# Patient Record
Sex: Male | Born: 1960 | Race: Black or African American | Hispanic: No | Marital: Single | Smoking: Former smoker
Health system: Southern US, Community
[De-identification: ages and names within clinical notes are randomized; demographics above are authoritative.]

## PROBLEM LIST (undated history)

## (undated) VITALS — BP 143/97 | HR 80 | Temp 97.5°F | Resp 17

## (undated) DIAGNOSIS — F319 Bipolar disorder, unspecified: Secondary | ICD-10-CM

## (undated) DIAGNOSIS — I509 Heart failure, unspecified: Secondary | ICD-10-CM

## (undated) DIAGNOSIS — F32A Depression, unspecified: Secondary | ICD-10-CM

## (undated) DIAGNOSIS — M1990 Unspecified osteoarthritis, unspecified site: Secondary | ICD-10-CM

## (undated) DIAGNOSIS — F329 Major depressive disorder, single episode, unspecified: Secondary | ICD-10-CM

## (undated) DIAGNOSIS — R011 Cardiac murmur, unspecified: Secondary | ICD-10-CM

## (undated) DIAGNOSIS — F40240 Claustrophobia: Secondary | ICD-10-CM

## (undated) DIAGNOSIS — I5033 Acute on chronic diastolic (congestive) heart failure: Secondary | ICD-10-CM

## (undated) DIAGNOSIS — R748 Abnormal levels of other serum enzymes: Secondary | ICD-10-CM

## (undated) DIAGNOSIS — I10 Essential (primary) hypertension: Secondary | ICD-10-CM

## (undated) HISTORY — DX: Abnormal levels of other serum enzymes: R74.8

## (undated) HISTORY — DX: Acute on chronic diastolic (congestive) heart failure: I50.33

## (undated) HISTORY — PX: KNEE SURGERY: SHX244

## (undated) NOTE — *Deleted (*Deleted)
Pharmacy Resident Rounding Note - for learning purposes only, not an active part of the chart  S/o  Admit Complaint:  Anticoagulation Infectious Disease Cardiovascular Endocrinology Gastrointestinal / Nutrition Neurology Nephrology Pulmonary Hematology / Oncology PTA Medication Issues Best Practices

## (undated) NOTE — *Deleted (*Deleted)
Pharmacy Resident Rounding Note - for learning purposes only, not an active part of the chart  S/o  Admit Complaint: HTN urgency, rhabdo  H/o uncontrolled DM x30y  Anticoagulation - enox 0.5mg /kg ppx Infectious Disease Cardiovascular SBP variable, 140-170, HR 60-70s dilt 180 q24, atenolol 25mg  qd, hydral prn Endocrinology CBGs <150 lantus 25 BID + SSI Gastrointestinal / Nutrition protonix  Neurology Gaba 800 bid, dose ok  Nephrology SCr 1.07>>1.42>>1.27>>1.17 UOP not charted accurately, ~0.56mL/kg 11/10 K 3.5>>3.3  Lasix 80mg  q12 >> q8h, albumin x1 KCl 20>>40 qd  Hgb 11/10  No role for ESA, iron labs outpatient  Pulmonary 100% RA  Hematology / Oncology PTA Medication Issues janumet, KCl 20 qd, ibuprofen,  Best Practices miralax    AoCKD stage II (nephrotic syndrome) Follow lytes w/ diuresis  Monitor cbc, f/u with iron labs if done inpatient F/u with benazepril restart

---

## 1999-01-22 ENCOUNTER — Emergency Department (HOSPITAL_COMMUNITY): Admission: EM | Admit: 1999-01-22 | Discharge: 1999-01-22 | Payer: Self-pay | Admitting: *Deleted

## 1999-08-21 ENCOUNTER — Encounter: Payer: Self-pay | Admitting: Emergency Medicine

## 1999-08-21 ENCOUNTER — Emergency Department (HOSPITAL_COMMUNITY): Admission: EM | Admit: 1999-08-21 | Discharge: 1999-08-21 | Payer: Self-pay | Admitting: Emergency Medicine

## 1999-09-03 ENCOUNTER — Encounter: Payer: Self-pay | Admitting: Emergency Medicine

## 1999-09-03 ENCOUNTER — Inpatient Hospital Stay (HOSPITAL_COMMUNITY): Admission: EM | Admit: 1999-09-03 | Discharge: 1999-09-06 | Payer: Self-pay | Admitting: Emergency Medicine

## 1999-09-05 ENCOUNTER — Encounter: Payer: Self-pay | Admitting: Internal Medicine

## 2000-05-12 ENCOUNTER — Emergency Department (HOSPITAL_COMMUNITY): Admission: EM | Admit: 2000-05-12 | Discharge: 2000-05-12 | Payer: Self-pay | Admitting: Emergency Medicine

## 2001-09-09 ENCOUNTER — Encounter: Payer: Self-pay | Admitting: Emergency Medicine

## 2001-09-10 ENCOUNTER — Inpatient Hospital Stay (HOSPITAL_COMMUNITY): Admission: EM | Admit: 2001-09-10 | Discharge: 2001-09-10 | Payer: Self-pay | Admitting: Emergency Medicine

## 2001-09-11 ENCOUNTER — Emergency Department (HOSPITAL_COMMUNITY): Admission: EM | Admit: 2001-09-11 | Discharge: 2001-09-11 | Payer: Self-pay | Admitting: Emergency Medicine

## 2001-09-15 ENCOUNTER — Encounter: Admission: RE | Admit: 2001-09-15 | Discharge: 2001-09-15 | Payer: Self-pay | Admitting: Sports Medicine

## 2001-10-06 ENCOUNTER — Encounter: Payer: Self-pay | Admitting: *Deleted

## 2001-10-06 ENCOUNTER — Emergency Department (HOSPITAL_COMMUNITY): Admission: EM | Admit: 2001-10-06 | Discharge: 2001-10-06 | Payer: Self-pay | Admitting: Emergency Medicine

## 2002-01-29 ENCOUNTER — Emergency Department (HOSPITAL_COMMUNITY): Admission: EM | Admit: 2002-01-29 | Discharge: 2002-01-29 | Payer: Self-pay | Admitting: Emergency Medicine

## 2002-01-29 ENCOUNTER — Encounter: Payer: Self-pay | Admitting: Emergency Medicine

## 2002-02-09 ENCOUNTER — Emergency Department (HOSPITAL_COMMUNITY): Admission: EM | Admit: 2002-02-09 | Discharge: 2002-02-09 | Payer: Self-pay | Admitting: Emergency Medicine

## 2002-02-09 ENCOUNTER — Encounter: Payer: Self-pay | Admitting: Emergency Medicine

## 2002-02-26 ENCOUNTER — Inpatient Hospital Stay (HOSPITAL_COMMUNITY): Admission: EM | Admit: 2002-02-26 | Discharge: 2002-02-28 | Payer: Self-pay | Admitting: Emergency Medicine

## 2003-07-16 ENCOUNTER — Emergency Department (HOSPITAL_COMMUNITY): Admission: EM | Admit: 2003-07-16 | Discharge: 2003-07-16 | Payer: Self-pay | Admitting: *Deleted

## 2003-07-17 ENCOUNTER — Emergency Department (HOSPITAL_COMMUNITY): Admission: EM | Admit: 2003-07-17 | Discharge: 2003-07-17 | Payer: Self-pay | Admitting: Emergency Medicine

## 2004-09-20 ENCOUNTER — Emergency Department (HOSPITAL_COMMUNITY): Admission: EM | Admit: 2004-09-20 | Discharge: 2004-09-20 | Payer: Self-pay | Admitting: Emergency Medicine

## 2007-03-15 ENCOUNTER — Emergency Department (HOSPITAL_COMMUNITY): Admission: EM | Admit: 2007-03-15 | Discharge: 2007-03-16 | Payer: Self-pay | Admitting: Emergency Medicine

## 2008-11-22 IMAGING — CR DG CHEST 1V PORT
1 series · 1 of 1 positions shown · non-contrast
Comparison: none

CLINICAL DATA: Chest pain.
 PORTABLE CHEST - 1 VIEW:

[AP]
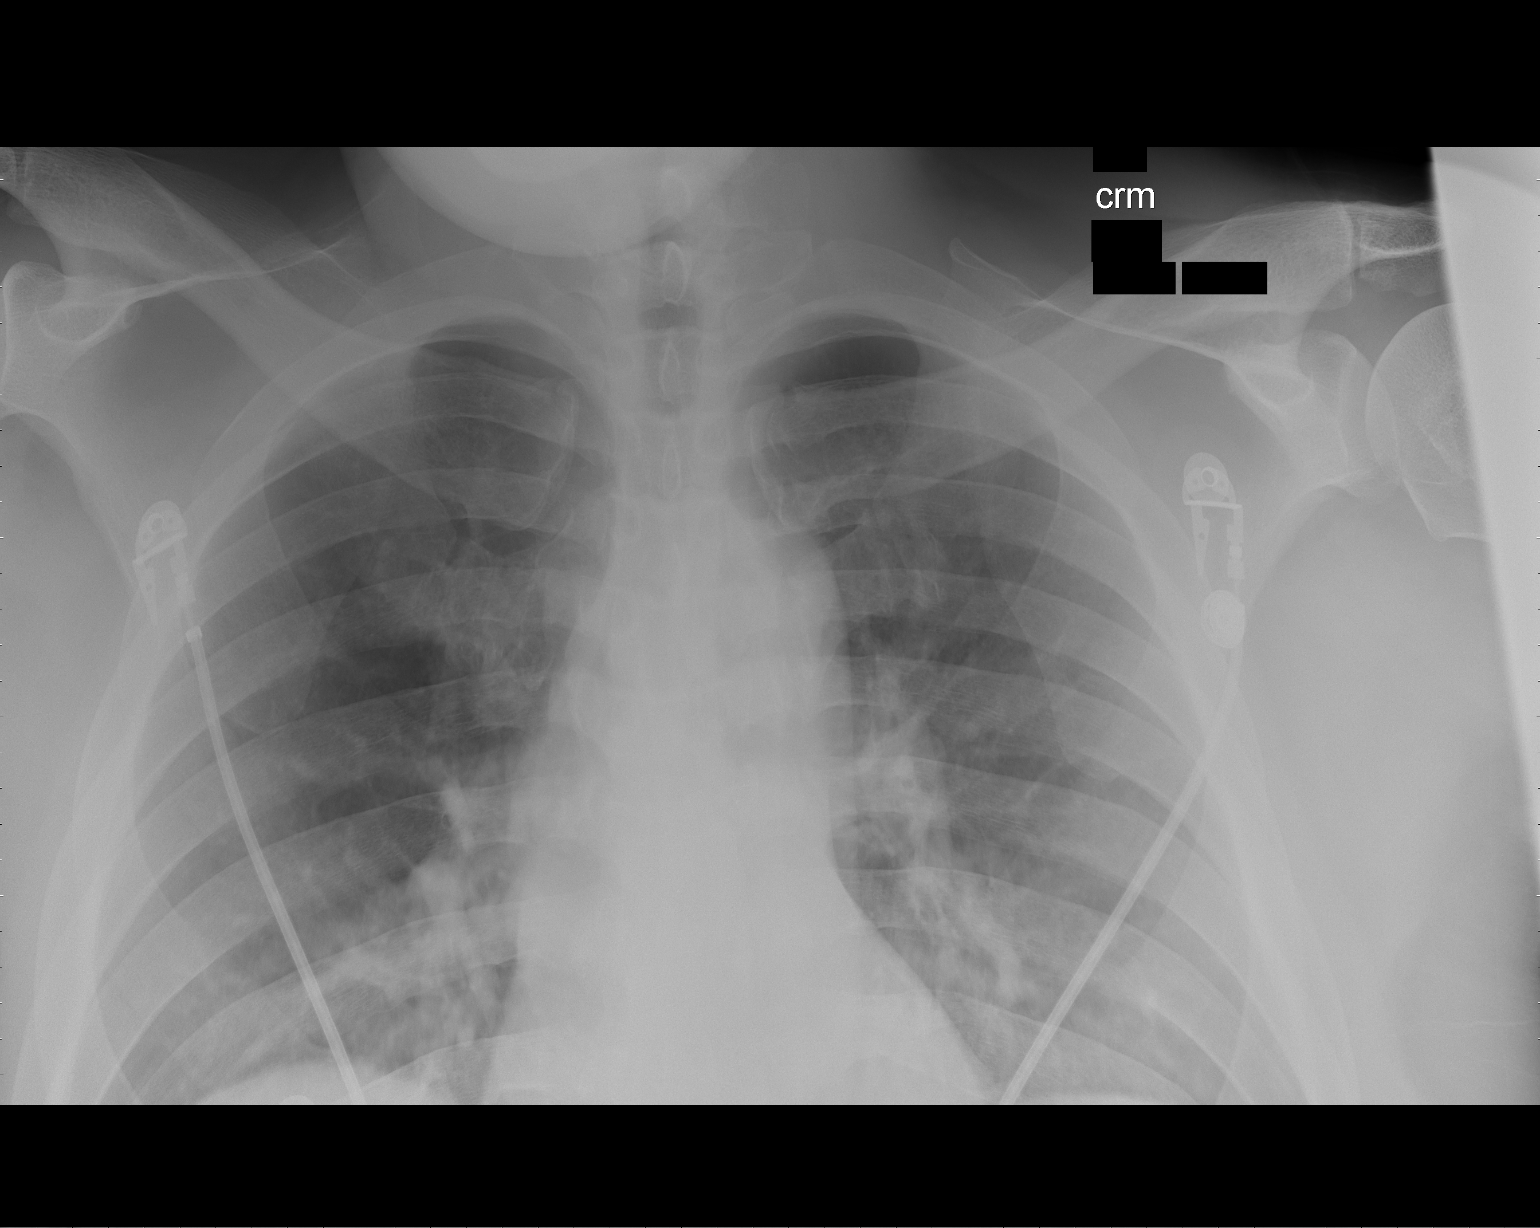

[1 of 1 positions shown; findings below may reference images not displayed]

FINDINGS: The study is limited by poor inspiration.  There is no acute infiltrate or pleural effusion.  No pulmonary edema noted.
IMPRESSION: No acute infiltrate or pleural effusion. No pulmonary edema.

## 2008-11-23 IMAGING — CT CT ANGIO CHEST
2 of 5 series · 19 of 36 positions shown · IV contrast (APPLIED)
Comparison: None.

CLINICAL DATA: 46-year-old with chest pain and shortness of breath. 
CT ANGIOGRAPHY OF CHEST:
TECHNIQUE: Multidetector CT imaging of the chest was performed during bolus injection of intravenous contrast.  Multiplanar CT angiographic image reconstructions were generated to evaluate the vascular anatomy.
Contrast:  100 cc Omnipaque 300.

[Series 5: pulm embolism 1.0 thins · axial · 0.73mm/px · z∈[-60,+152]mm · 16 of 238 slices shown]
[im 13/238  lung]
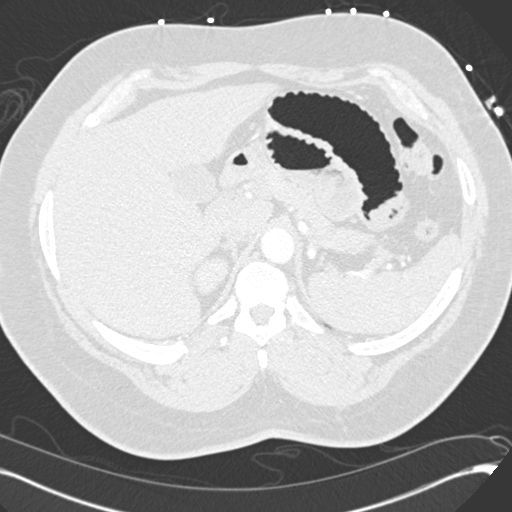
[im 25/238  mediastinal]
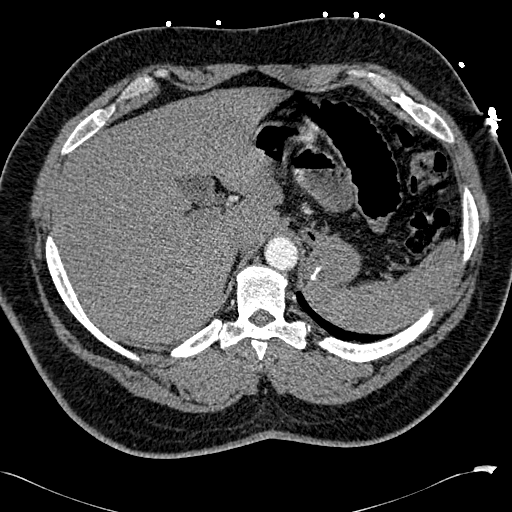
[im 38/238  lung]
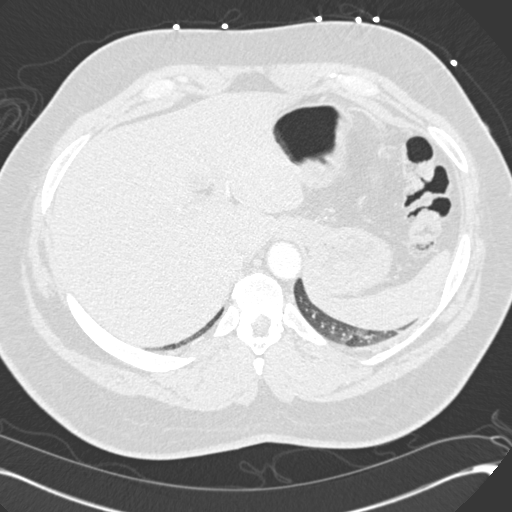
[im 50/238  mediastinal]
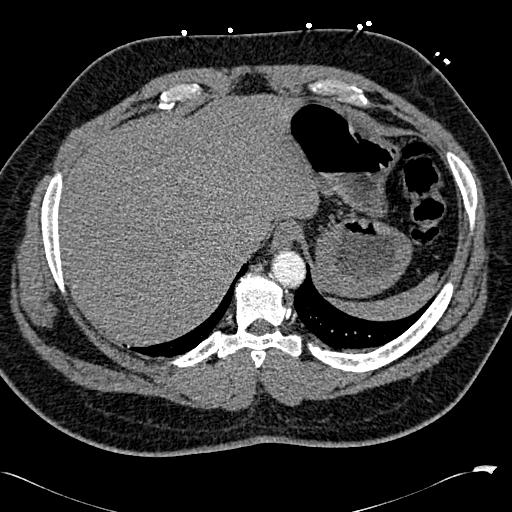
[im 75/238  lung]
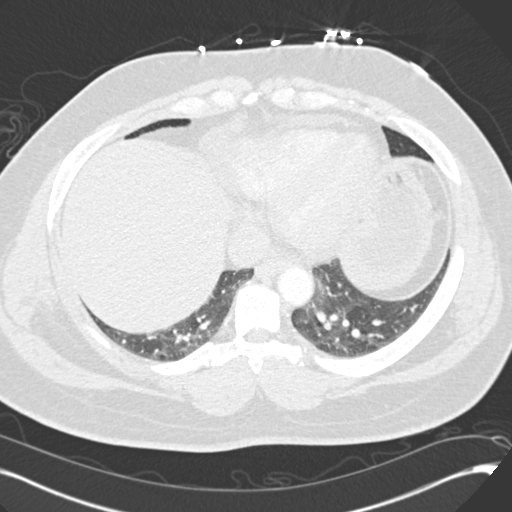
[im 88/238  mediastinal]
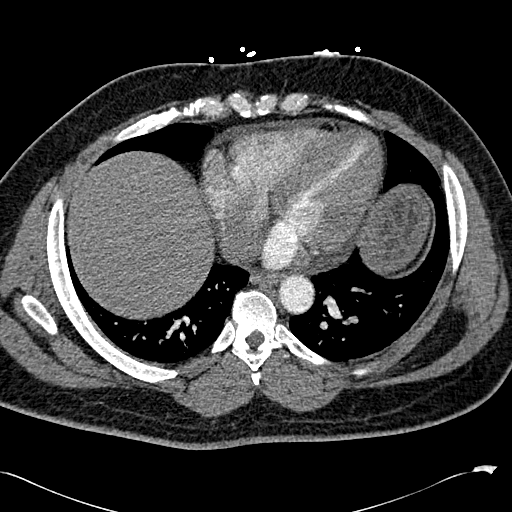
[im 100/238  lung]
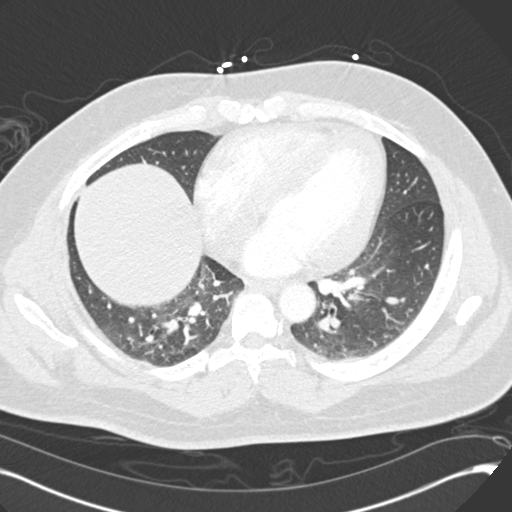
[im 113/238  mediastinal]
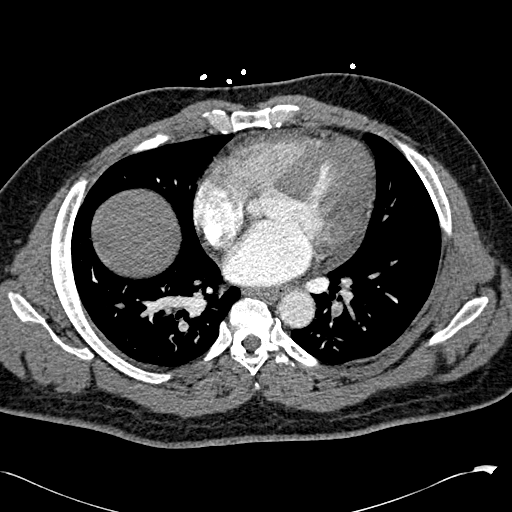
[im 125/238  lung]
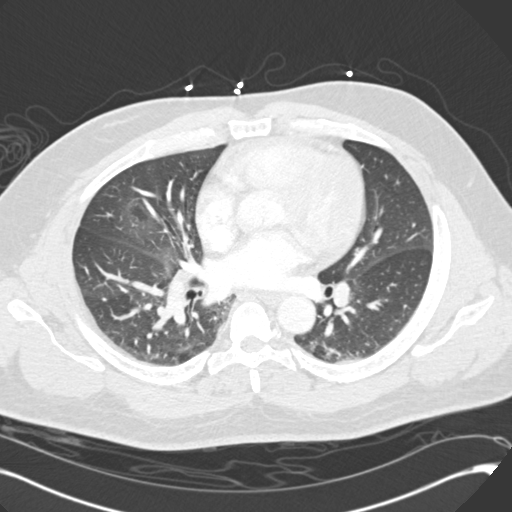
[im 138/238  mediastinal]
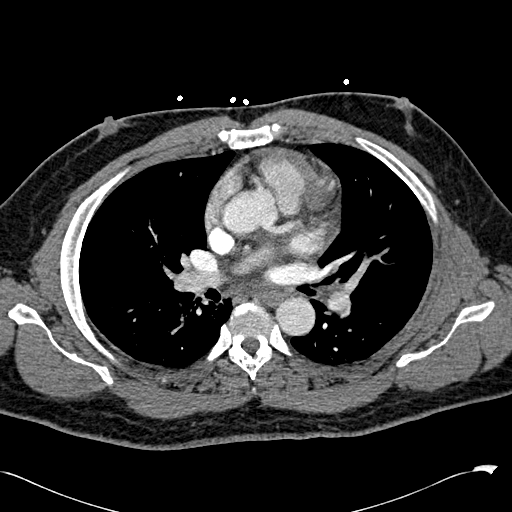
[im 150/238  lung]
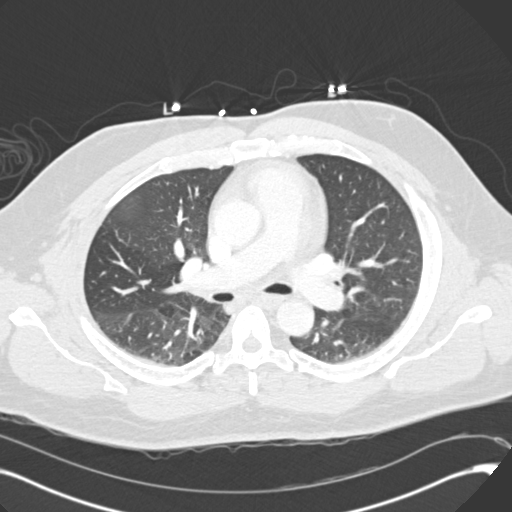
[im 163/238  mediastinal]
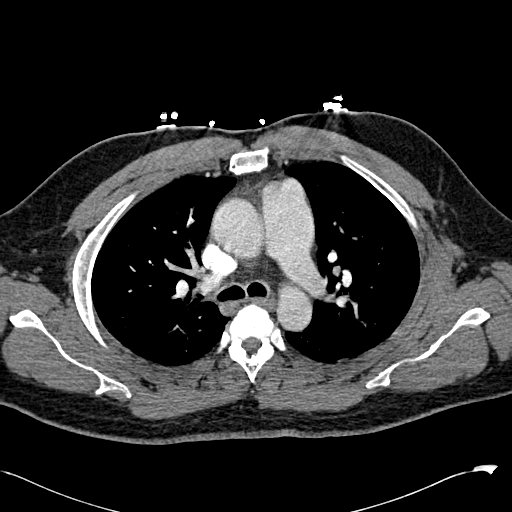
[im 188/238  lung]
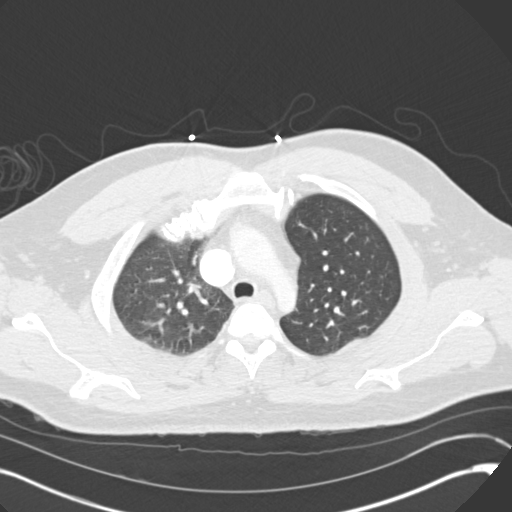
[im 200/238  mediastinal]
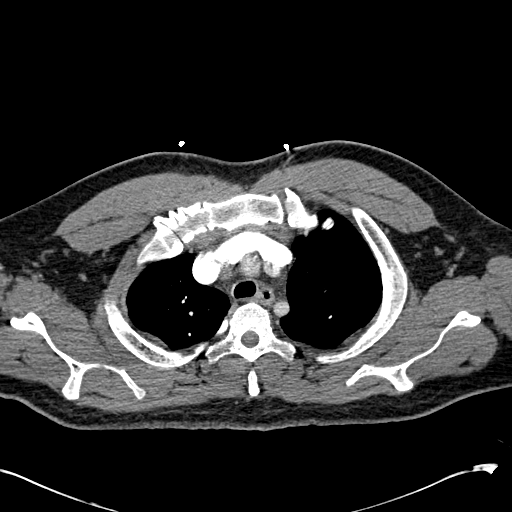
[im 213/238  lung]
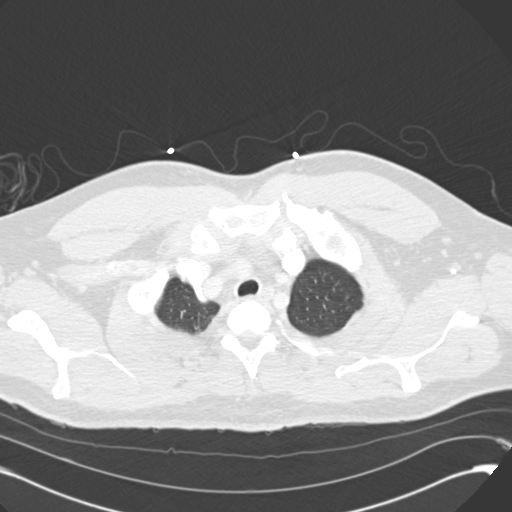
[im 225/238  mediastinal]
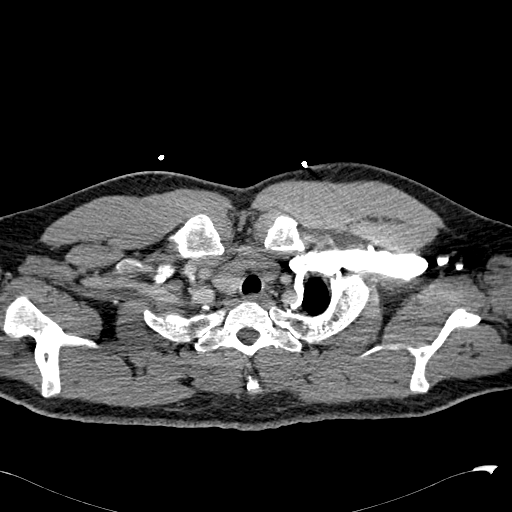

[Series 9: pulm embolism 2.0 cor · coronal · 0.63mm/px · 3 of 114 slices shown]
[im 23/114  mediastinal]
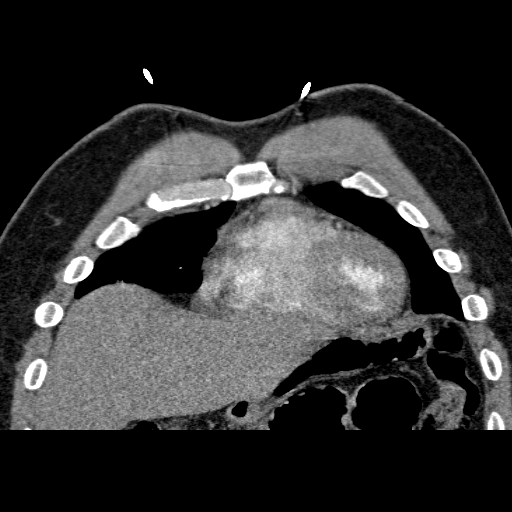
[im 46/114  mediastinal]
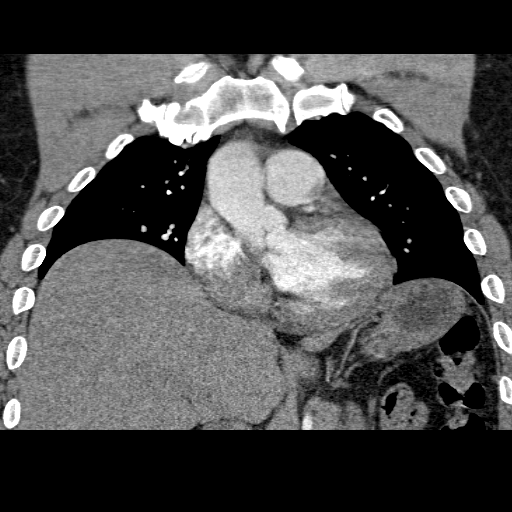
[im 68/114  mediastinal]
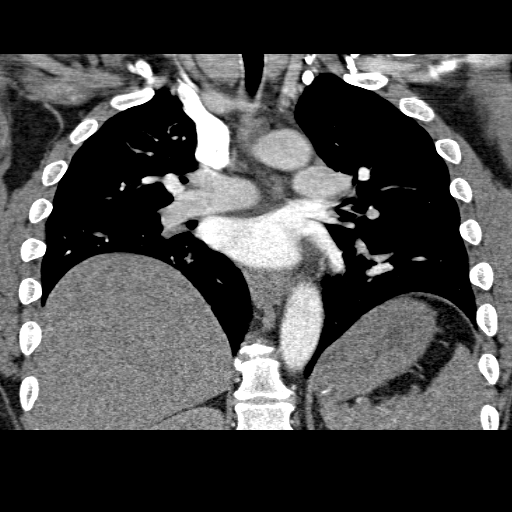

[19 of 36 positions shown; findings below may reference images not displayed]

FINDINGS: The chest wall, soft tissues and bony structures are unremarkable.  Small axillary lymph nodes but no adenopathy.  The thyroid gland is grossly normal.  The aorta is normal in caliber.  There is a small amount of pericardial fluid.  No mediastinal or hilar adenopathy.  The aorta is normal in caliber.  No dissection.  The pulmonary arterial tree is suboptimally opacified, but I do not see any definite filling defects to suggest pulmonary emboli.
The esophagus is grossly normal.  
Examination of the lung parenchyma demonstrates dependent atelectasis.  No pleural effusions or pulmonary edema.  Small right middle lobe pulmonary nodule is noted with sharp margins and most likely a pulmonary lymph node. 
The upper abdomen is grossly normal.
IMPRESSION: 1.  Suboptimal opacification of pulmonary arteries, but no definite pulmonary emboli are seen.
2.  Small pericardial effusion.  
3.   Dependent atelectasis and mild vascular congestion without overt pulmonary edema. 
4.  Right middle lobe pulmonary nodule is likely a benign intrapulmonary lymph node.  
5.  Followup noncontrast chest CT in 12 months is suggested.  
6.  No mediastinal or hilar adenopathy and unremarkable upper abdomen.

## 2009-10-15 ENCOUNTER — Emergency Department (HOSPITAL_COMMUNITY): Admission: EM | Admit: 2009-10-15 | Discharge: 2009-10-15 | Payer: Self-pay | Admitting: Emergency Medicine

## 2009-12-24 ENCOUNTER — Inpatient Hospital Stay (HOSPITAL_COMMUNITY): Admission: EM | Admit: 2009-12-24 | Discharge: 2009-12-26 | Payer: Self-pay | Admitting: Emergency Medicine

## 2010-01-29 ENCOUNTER — Inpatient Hospital Stay (HOSPITAL_COMMUNITY)
Admission: EM | Admit: 2010-01-29 | Discharge: 2010-02-01 | Payer: Self-pay | Source: Home / Self Care | Attending: Internal Medicine | Admitting: Internal Medicine

## 2010-01-30 ENCOUNTER — Encounter (INDEPENDENT_AMBULATORY_CARE_PROVIDER_SITE_OTHER): Payer: Self-pay | Admitting: Internal Medicine

## 2010-02-01 ENCOUNTER — Encounter (INDEPENDENT_AMBULATORY_CARE_PROVIDER_SITE_OTHER): Payer: Self-pay | Admitting: Internal Medicine

## 2010-05-07 LAB — BASIC METABOLIC PANEL
BUN: 9 mg/dL (ref 6–23)
CO2: 23 mEq/L (ref 19–32)
Calcium: 9.4 mg/dL (ref 8.4–10.5)
Chloride: 104 mEq/L (ref 96–112)
Creatinine, Ser: 0.75 mg/dL (ref 0.4–1.5)
GFR calc Af Amer: >60 mL/min (ref >60)
GFR calc non Af Amer: >60 mL/min (ref >60)
Glucose, Bld: 277 mg/dL — ABNORMAL HIGH (ref 70–99)
Potassium: 3.7 mEq/L (ref 3.5–5.1)
Sodium: 135 mEq/L (ref 135–145)

## 2010-05-07 LAB — CBC
HCT: 37 % — ABNORMAL LOW (ref 39.0–52.0)
HCT: 38.4 % — ABNORMAL LOW (ref 39.0–52.0)
Hemoglobin: 12 g/dL — ABNORMAL LOW (ref 13.0–17.0)
Hemoglobin: 13.1 g/dL (ref 13.0–17.0)
MCH: 25.4 pg — ABNORMAL LOW (ref 26.0–34.0)
MCH: 26.5 pg (ref 26.0–34.0)
MCHC: 32.4 g/dL (ref 30.0–36.0)
MCHC: 34.1 g/dL (ref 30.0–36.0)
MCV: 77.6 fL — ABNORMAL LOW (ref 78.0–100.0)
MCV: 78.4 fL (ref 78.0–100.0)
Platelets: 162 10*3/uL (ref 150–400)
Platelets: 164 10*3/uL (ref 150–400)
RBC: 4.72 MIL/uL (ref 4.22–5.81)
RBC: 4.95 MIL/uL (ref 4.22–5.81)
RDW: 13.5 % (ref 11.5–15.5)
RDW: 13.6 % (ref 11.5–15.5)
WBC: 3.8 10*3/uL — ABNORMAL LOW (ref 4.0–10.5)
WBC: 4.5 10*3/uL (ref 4.0–10.5)

## 2010-05-07 LAB — CARDIAC PANEL(CRET KIN+CKTOT+MB+TROPI)
CK, MB: 2.2 ng/mL (ref 0.3–4.0)
CK, MB: 2.4 ng/mL (ref 0.3–4.0)
Relative Index: 2.1 (ref 0.0–2.5)
Relative Index: 2.3 (ref 0.0–2.5)
Total CK: 104 U/L (ref 7–232)
Total CK: 105 U/L (ref 7–232)
Troponin I: 0.01 ng/mL (ref 0.00–0.06)
Troponin I: 0.02 ng/mL (ref 0.00–0.06)

## 2010-05-07 LAB — COMPREHENSIVE METABOLIC PANEL
ALT: 14 U/L (ref 0–53)
AST: 18 U/L (ref 0–37)
Albumin: 3.2 g/dL — ABNORMAL LOW (ref 3.5–5.2)
Alkaline Phosphatase: 67 U/L (ref 39–117)
BUN: 9 mg/dL (ref 6–23)
CO2: 26 mEq/L (ref 19–32)
Calcium: 8.7 mg/dL (ref 8.4–10.5)
Chloride: 101 mEq/L (ref 96–112)
Creatinine, Ser: 0.81 mg/dL (ref 0.4–1.5)
GFR calc Af Amer: >60 mL/min (ref >60)
GFR calc non Af Amer: >60 mL/min (ref >60)
Glucose, Bld: 334 mg/dL — ABNORMAL HIGH (ref 70–99)
Potassium: 3.6 mEq/L (ref 3.5–5.1)
Sodium: 133 mEq/L — ABNORMAL LOW (ref 135–145)
Total Bilirubin: 0.6 mg/dL (ref 0.3–1.2)
Total Protein: 6.6 g/dL (ref 6.0–8.3)

## 2010-05-07 LAB — RAPID URINE DRUG SCREEN, HOSP PERFORMED
Amphetamines: NOT DETECTED
Barbiturates: NOT DETECTED
Benzodiazepines: NOT DETECTED
Cocaine: POSITIVE — AB
Opiates: POSITIVE — AB
Tetrahydrocannabinol: NOT DETECTED

## 2010-05-07 LAB — DIFFERENTIAL
Basophils Absolute: 0 10*3/uL (ref 0.0–0.1)
Basophils Relative: 0 % (ref 0–1)
Eosinophils Absolute: 0 10*3/uL (ref 0.0–0.7)
Eosinophils Relative: 1 % (ref 0–5)
Lymphocytes Relative: 50 % — ABNORMAL HIGH (ref 12–46)
Lymphs Abs: 2.2 10*3/uL (ref 0.7–4.0)
Monocytes Absolute: 0.5 10*3/uL (ref 0.1–1.0)
Monocytes Relative: 11 % (ref 3–12)
Neutro Abs: 1.7 10*3/uL (ref 1.7–7.7)
Neutrophils Relative %: 38 % — ABNORMAL LOW (ref 43–77)

## 2010-05-07 LAB — URINALYSIS, ROUTINE W REFLEX MICROSCOPIC
Bilirubin Urine: NEGATIVE
Glucose, UA: >1000 mg/dL — AB
Hgb urine dipstick: NEGATIVE
Ketones, ur: 15 mg/dL — AB
Leukocytes, UA: NEGATIVE
Nitrite: NEGATIVE
Protein, ur: 30 mg/dL — AB
Specific Gravity, Urine: >1.046 — ABNORMAL HIGH (ref 1.005–1.030)
Urobilinogen, UA: 1 mg/dL (ref 0.0–1.0)
pH: 7 (ref 5.0–8.0)

## 2010-05-07 LAB — URINE MICROSCOPIC-ADD ON

## 2010-05-07 LAB — CK TOTAL AND CKMB (NOT AT ARMC)
CK, MB: 2.1 ng/mL (ref 0.3–4.0)
Relative Index: 2 (ref 0.0–2.5)
Total CK: 105 U/L (ref 7–232)

## 2010-05-07 LAB — GLUCOSE, CAPILLARY
Glucose-Capillary: 135 mg/dL — ABNORMAL HIGH (ref 70–99)
Glucose-Capillary: 189 mg/dL — ABNORMAL HIGH (ref 70–99)
Glucose-Capillary: 201 mg/dL — ABNORMAL HIGH (ref 70–99)
Glucose-Capillary: 208 mg/dL — ABNORMAL HIGH (ref 70–99)
Glucose-Capillary: 215 mg/dL — ABNORMAL HIGH (ref 70–99)
Glucose-Capillary: 229 mg/dL — ABNORMAL HIGH (ref 70–99)
Glucose-Capillary: 234 mg/dL — ABNORMAL HIGH (ref 70–99)
Glucose-Capillary: 256 mg/dL — ABNORMAL HIGH (ref 70–99)
Glucose-Capillary: 267 mg/dL — ABNORMAL HIGH (ref 70–99)
Glucose-Capillary: 270 mg/dL — ABNORMAL HIGH (ref 70–99)
Glucose-Capillary: 276 mg/dL — ABNORMAL HIGH (ref 70–99)
Glucose-Capillary: 276 mg/dL — ABNORMAL HIGH (ref 70–99)
Glucose-Capillary: 310 mg/dL — ABNORMAL HIGH (ref 70–99)
Glucose-Capillary: 343 mg/dL — ABNORMAL HIGH (ref 70–99)

## 2010-05-07 LAB — POCT CARDIAC MARKERS
CKMB, poc: 1.4 ng/mL (ref 1.0–8.0)
Myoglobin, poc: 59.6 ng/mL (ref 12–200)
Troponin i, poc: <0.05 ng/mL (ref 0.00–0.09)

## 2010-05-07 LAB — TROPONIN I: Troponin I: 0.01 ng/mL (ref 0.00–0.06)

## 2010-05-07 LAB — D-DIMER, QUANTITATIVE: D-Dimer, Quant: 0.52 ug/mL-FEU — ABNORMAL HIGH (ref 0.00–0.48)

## 2010-05-08 LAB — GLUCOSE, CAPILLARY
Glucose-Capillary: 217 mg/dL — ABNORMAL HIGH (ref 70–99)
Glucose-Capillary: 244 mg/dL — ABNORMAL HIGH (ref 70–99)

## 2010-05-08 LAB — CBC
HCT: 35.2 % — ABNORMAL LOW (ref 39.0–52.0)
Hemoglobin: 11.7 g/dL — ABNORMAL LOW (ref 13.0–17.0)
MCH: 26 pg (ref 26.0–34.0)
MCHC: 33.2 g/dL (ref 30.0–36.0)
MCV: 78.2 fL (ref 78.0–100.0)
Platelets: 180 10*3/uL (ref 150–400)
RBC: 4.5 MIL/uL (ref 4.22–5.81)
RDW: 12.7 % (ref 11.5–15.5)
WBC: 4 10*3/uL (ref 4.0–10.5)

## 2010-05-08 LAB — BASIC METABOLIC PANEL
BUN: 6 mg/dL (ref 6–23)
CO2: 24 mEq/L (ref 19–32)
Calcium: 8.4 mg/dL (ref 8.4–10.5)
Chloride: 102 mEq/L (ref 96–112)
Creatinine, Ser: 0.65 mg/dL (ref 0.4–1.5)
GFR calc Af Amer: >60 mL/min (ref >60)
GFR calc non Af Amer: >60 mL/min (ref >60)
Glucose, Bld: 209 mg/dL — ABNORMAL HIGH (ref 70–99)
Potassium: 4.1 mEq/L (ref 3.5–5.1)
Sodium: 132 mEq/L — ABNORMAL LOW (ref 135–145)

## 2010-05-09 LAB — DIFFERENTIAL
Basophils Absolute: 0 10*3/uL (ref 0.0–0.1)
Basophils Absolute: 0 10*3/uL (ref 0.0–0.1)
Basophils Relative: 0 % (ref 0–1)
Basophils Relative: 0 % (ref 0–1)
Eosinophils Absolute: 0 10*3/uL (ref 0.0–0.7)
Eosinophils Absolute: 0.1 10*3/uL (ref 0.0–0.7)
Eosinophils Relative: 0 % (ref 0–5)
Eosinophils Relative: 2 % (ref 0–5)
Lymphocytes Relative: 28 % (ref 12–46)
Lymphocytes Relative: 28 % (ref 12–46)
Lymphs Abs: 1.2 10*3/uL (ref 0.7–4.0)
Lymphs Abs: 1.7 10*3/uL (ref 0.7–4.0)
Monocytes Absolute: 0.7 10*3/uL (ref 0.1–1.0)
Monocytes Absolute: 1.1 10*3/uL — ABNORMAL HIGH (ref 0.1–1.0)
Monocytes Relative: 16 % — ABNORMAL HIGH (ref 3–12)
Monocytes Relative: 18 % — ABNORMAL HIGH (ref 3–12)
Neutro Abs: 2.4 10*3/uL (ref 1.7–7.7)
Neutro Abs: 3.3 10*3/uL (ref 1.7–7.7)
Neutrophils Relative %: 54 % (ref 43–77)
Neutrophils Relative %: 55 % (ref 43–77)

## 2010-05-09 LAB — CARDIAC PANEL(CRET KIN+CKTOT+MB+TROPI)
CK, MB: 1.8 ng/mL (ref 0.3–4.0)
CK, MB: 2.4 ng/mL (ref 0.3–4.0)
Relative Index: 0.8 (ref 0.0–2.5)
Relative Index: 1.1 (ref 0.0–2.5)
Total CK: 217 U/L (ref 7–232)
Total CK: 219 U/L (ref 7–232)
Troponin I: 0.02 ng/mL (ref 0.00–0.06)
Troponin I: <0.01 ng/mL (ref 0.00–0.06)

## 2010-05-09 LAB — COMPREHENSIVE METABOLIC PANEL
ALT: 14 U/L (ref 0–53)
AST: 17 U/L (ref 0–37)
Albumin: 3.1 g/dL — ABNORMAL LOW (ref 3.5–5.2)
Alkaline Phosphatase: 68 U/L (ref 39–117)
BUN: 8 mg/dL (ref 6–23)
CO2: 24 mEq/L (ref 19–32)
Calcium: 8.6 mg/dL (ref 8.4–10.5)
Chloride: 101 mEq/L (ref 96–112)
Creatinine, Ser: 0.73 mg/dL (ref 0.4–1.5)
GFR calc Af Amer: >60 mL/min (ref >60)
GFR calc non Af Amer: >60 mL/min (ref >60)
Glucose, Bld: 284 mg/dL — ABNORMAL HIGH (ref 70–99)
Potassium: 3.7 mEq/L (ref 3.5–5.1)
Sodium: 131 mEq/L — ABNORMAL LOW (ref 135–145)
Total Bilirubin: 0.6 mg/dL (ref 0.3–1.2)
Total Protein: 6.8 g/dL (ref 6.0–8.3)

## 2010-05-09 LAB — GLUCOSE, CAPILLARY
Glucose-Capillary: 181 mg/dL — ABNORMAL HIGH (ref 70–99)
Glucose-Capillary: 181 mg/dL — ABNORMAL HIGH (ref 70–99)
Glucose-Capillary: 241 mg/dL — ABNORMAL HIGH (ref 70–99)
Glucose-Capillary: 268 mg/dL — ABNORMAL HIGH (ref 70–99)
Glucose-Capillary: 277 mg/dL — ABNORMAL HIGH (ref 70–99)
Glucose-Capillary: 288 mg/dL — ABNORMAL HIGH (ref 70–99)
Glucose-Capillary: 296 mg/dL — ABNORMAL HIGH (ref 70–99)
Glucose-Capillary: 332 mg/dL — ABNORMAL HIGH (ref 70–99)

## 2010-05-09 LAB — RAPID URINE DRUG SCREEN, HOSP PERFORMED
Amphetamines: NOT DETECTED
Barbiturates: NOT DETECTED
Benzodiazepines: NOT DETECTED
Cocaine: POSITIVE — AB
Opiates: NOT DETECTED
Tetrahydrocannabinol: NOT DETECTED

## 2010-05-09 LAB — CK TOTAL AND CKMB (NOT AT ARMC)
CK, MB: 1.6 ng/mL (ref 0.3–4.0)
Relative Index: 0.7 (ref 0.0–2.5)
Total CK: 235 U/L — ABNORMAL HIGH (ref 7–232)

## 2010-05-09 LAB — URINALYSIS, ROUTINE W REFLEX MICROSCOPIC
Bilirubin Urine: NEGATIVE
Glucose, UA: >1000 mg/dL — AB
Hgb urine dipstick: NEGATIVE
Ketones, ur: NEGATIVE mg/dL
Leukocytes, UA: NEGATIVE
Nitrite: NEGATIVE
Protein, ur: NEGATIVE mg/dL
Specific Gravity, Urine: 1.02 (ref 1.005–1.030)
Urobilinogen, UA: 0.2 mg/dL (ref 0.0–1.0)
pH: 5.5 (ref 5.0–8.0)

## 2010-05-09 LAB — MAGNESIUM: Magnesium: 1.6 mg/dL (ref 1.5–2.5)

## 2010-05-09 LAB — URINE CULTURE
Colony Count: NO GROWTH
Culture  Setup Time: 201110310155
Culture: NO GROWTH
Special Requests: NEGATIVE

## 2010-05-09 LAB — LIPID PANEL
Cholesterol: 167 mg/dL (ref 0–200)
HDL: 46 mg/dL (ref >39)
LDL Cholesterol: 111 mg/dL — ABNORMAL HIGH (ref 0–99)
Total CHOL/HDL Ratio: 3.6 RATIO
Triglycerides: 49 mg/dL (ref <150)
VLDL: 10 mg/dL (ref 0–40)

## 2010-05-09 LAB — CBC
HCT: 35 % — ABNORMAL LOW (ref 39.0–52.0)
HCT: 37.3 % — ABNORMAL LOW (ref 39.0–52.0)
Hemoglobin: 11.8 g/dL — ABNORMAL LOW (ref 13.0–17.0)
Hemoglobin: 12.6 g/dL — ABNORMAL LOW (ref 13.0–17.0)
MCH: 26.1 pg (ref 26.0–34.0)
MCH: 26.5 pg (ref 26.0–34.0)
MCHC: 33.7 g/dL (ref 30.0–36.0)
MCHC: 33.8 g/dL (ref 30.0–36.0)
MCV: 77.4 fL — ABNORMAL LOW (ref 78.0–100.0)
MCV: 78.7 fL (ref 78.0–100.0)
Platelets: 185 10*3/uL (ref 150–400)
Platelets: 188 10*3/uL (ref 150–400)
RBC: 4.45 MIL/uL (ref 4.22–5.81)
RBC: 4.82 MIL/uL (ref 4.22–5.81)
RDW: 12.8 % (ref 11.5–15.5)
RDW: 12.9 % (ref 11.5–15.5)
WBC: 4.3 10*3/uL (ref 4.0–10.5)
WBC: 6.1 10*3/uL (ref 4.0–10.5)

## 2010-05-09 LAB — POCT I-STAT, CHEM 8
BUN: 8 mg/dL (ref 6–23)
Calcium, Ion: 1.07 mmol/L — ABNORMAL LOW (ref 1.12–1.32)
Chloride: 101 mEq/L (ref 96–112)
Creatinine, Ser: 0.9 mg/dL (ref 0.4–1.5)
Glucose, Bld: 299 mg/dL — ABNORMAL HIGH (ref 70–99)
HCT: 40 % (ref 39.0–52.0)
Hemoglobin: 13.6 g/dL (ref 13.0–17.0)
Potassium: 3.9 mEq/L (ref 3.5–5.1)
Sodium: 134 mEq/L — ABNORMAL LOW (ref 135–145)
TCO2: 23 mmol/L (ref 0–100)

## 2010-05-09 LAB — BASIC METABOLIC PANEL
BUN: 8 mg/dL (ref 6–23)
CO2: 27 mEq/L (ref 19–32)
Calcium: 8.7 mg/dL (ref 8.4–10.5)
Chloride: 102 mEq/L (ref 96–112)
Creatinine, Ser: 0.76 mg/dL (ref 0.4–1.5)
GFR calc Af Amer: >60 mL/min (ref >60)
GFR calc non Af Amer: >60 mL/min (ref >60)
Glucose, Bld: 277 mg/dL — ABNORMAL HIGH (ref 70–99)
Potassium: 3.9 mEq/L (ref 3.5–5.1)
Sodium: 136 mEq/L (ref 135–145)

## 2010-05-09 LAB — TROPONIN I: Troponin I: <0.01 ng/mL (ref 0.00–0.06)

## 2010-05-09 LAB — URINE MICROSCOPIC-ADD ON

## 2010-05-09 LAB — APTT: aPTT: 28 seconds (ref 24–37)

## 2010-05-09 LAB — HEMOGLOBIN A1C
Hgb A1c MFr Bld: 11.6 % — ABNORMAL HIGH (ref <5.7)
Mean Plasma Glucose: 286 mg/dL — ABNORMAL HIGH (ref <117)

## 2010-05-10 LAB — RAPID URINE DRUG SCREEN, HOSP PERFORMED
Amphetamines: NOT DETECTED
Barbiturates: NOT DETECTED
Benzodiazepines: NOT DETECTED
Cocaine: POSITIVE — AB
Opiates: NOT DETECTED
Tetrahydrocannabinol: NOT DETECTED

## 2010-05-10 LAB — DIFFERENTIAL
Basophils Absolute: 0 10*3/uL (ref 0.0–0.1)
Basophils Relative: 0 % (ref 0–1)
Eosinophils Absolute: 0 10*3/uL (ref 0.0–0.7)
Eosinophils Relative: 1 % (ref 0–5)
Lymphocytes Relative: 42 % (ref 12–46)
Lymphs Abs: 1.8 10*3/uL (ref 0.7–4.0)
Monocytes Absolute: 0.5 10*3/uL (ref 0.1–1.0)
Monocytes Relative: 11 % (ref 3–12)
Neutro Abs: 2 10*3/uL (ref 1.7–7.7)
Neutrophils Relative %: 46 % (ref 43–77)

## 2010-05-10 LAB — HEPATIC FUNCTION PANEL
ALT: 15 U/L (ref 0–53)
AST: 19 U/L (ref 0–37)
Albumin: 3.6 g/dL (ref 3.5–5.2)
Alkaline Phosphatase: 67 U/L (ref 39–117)
Bilirubin, Direct: 0.1 mg/dL (ref 0.0–0.3)
Indirect Bilirubin: 0.5 mg/dL (ref 0.3–0.9)
Total Bilirubin: 0.6 mg/dL (ref 0.3–1.2)
Total Protein: 7 g/dL (ref 6.0–8.3)

## 2010-05-10 LAB — BASIC METABOLIC PANEL
BUN: 6 mg/dL (ref 6–23)
CO2: 29 mEq/L (ref 19–32)
Calcium: 9.1 mg/dL (ref 8.4–10.5)
Chloride: 103 mEq/L (ref 96–112)
Creatinine, Ser: 0.85 mg/dL (ref 0.4–1.5)
GFR calc Af Amer: >60 mL/min (ref >60)
GFR calc non Af Amer: >60 mL/min (ref >60)
Glucose, Bld: 305 mg/dL — ABNORMAL HIGH (ref 70–99)
Potassium: 3.8 mEq/L (ref 3.5–5.1)
Sodium: 138 mEq/L (ref 135–145)

## 2010-05-10 LAB — CBC
HCT: 40.6 % (ref 39.0–52.0)
Hemoglobin: 13.4 g/dL (ref 13.0–17.0)
MCH: 26.4 pg (ref 26.0–34.0)
MCHC: 33 g/dL (ref 30.0–36.0)
MCV: 79.9 fL (ref 78.0–100.0)
Platelets: 179 10*3/uL (ref 150–400)
RBC: 5.08 MIL/uL (ref 4.22–5.81)
RDW: 14.4 % (ref 11.5–15.5)
WBC: 4.4 10*3/uL (ref 4.0–10.5)

## 2010-05-10 LAB — TSH: TSH: 1.09 u[IU]/mL (ref 0.350–4.500)

## 2010-05-10 LAB — ETHANOL: Alcohol, Ethyl (B): <5 mg/dL (ref 0–10)

## 2010-07-13 NOTE — Discharge Summary (Signed)
[place]. [facility]  Patient:    [name], [name]                        MRN: [id number] Adm. Date:  [id number] Disc. Date: [id number] Attending:  Starla Link Dictator:   Yetta Numbers, M.D. CC:         [place]. Hassell Done, M.D.             [facility]                           Discharge Summary  DATE OF BIRTH:  Jan 06, 1961  DISCHARGE DIAGNOSES: 1. Pancreatitis, resolved. 2. Hypertension. 3. Status post right knee arthroscopic surgery.  DISCHARGE MEDICATIONS:  Hydrochlorothiazide 12.5 mg p.o. q.a.m.  FOLLOW-UP:  Follow up at medicine outpatient clinic Monday, September 24, 1999, at 3 oclock p.m.  Also, follow up with general surgery outpatient clinic Monday, September 17, 1999, at 12 noon with Dr. Kaylyn Lim. DD:  09/10/99 TD:  09/10/99 Job: [id number]

## 2010-07-13 NOTE — Discharge Summary (Signed)
NAME:  [name], [name] NO.:  [id number]   MEDICAL RECORD NO.:  [id number]                    PATIENT TYPE:   LOCATION:                                       FACILITY:   PHYSICIAN:  Ashby Dawes. Polite, M.D.              DATE OF BIRTH:  1961/01/17   DATE OF ADMISSION:  02/26/2002  DATE OF DISCHARGE:  02/28/2002                                 DISCHARGE SUMMARY   DISCHARGE DIAGNOSES:  1. Drug overdose.  2. Suicide attempt.  3. Diabetes mellitus.  4. Hypertension.   DISCHARGE MEDICATIONS:  May restart Glucophage 500 mg twice daily if CBG is  greater than 130 for two days.   CONSULTATIONS:  Felizardo Hoffmann, M.D., psychiatry.   PROCEDURES AND STUDIES:  A 12-lead EKG on February 26, 2002 with normal sinus  rhythm.   LABORATORY DATA:  Sodium 137, potassium 3.6, chloride 106, CO2 25, glucose  112, BUN 7, creatinine 0.7, calcium 8.7, AST 16, ALT 12.  WBC count 3.7,  RBCs 4.99, hemoglobin 13.1, hematocrit 39.2, MCV 78.6.  CK 212, CK-MB 3.3,  troponin 0.02.  Acetaminophen level less than 10, salicylate level less than  4.  Urine drug screen positive for cocaine.  Urinalysis negative.   DISPOSITION:  The patient is being discharged to go to [facility].   CONDITION ON DISCHARGE:  Stable.   HISTORY OF PRESENT ILLNESS:  A 50 year old man who presented to [place]  ER after calling 911 due to intense epigastric pain.  The patient admitted  to an overdose on Glucophage and Arthrotec.  Initial evaluation noted mid  epigastric pain and hypoactive bowel sounds.  The patient was admitted for a  drug overdose.   HOSPITAL COURSE:  Problem 1.  DRUG OVERDOSE:  Again, the patient admitted to  overdosing on Glucophage and Arthrotec.  The patient admitted to taking  approximately 53 pills.  He was given charcoal in the ED and one episode of  emesis.  [place] was contacted.  The patient was provided with  D-5 IV fluids, Accu-Cheks were performed  every hour.  Protonix was added for  gastric protection.  Serial BMETs were performed to assess for lactic  acidosis.  AST and ALT were within normal limits.  The patient had no CBGs  less than 100.  He continued to tolerate a regular diet.  At discharge his  CBG is 131.   Problem 2.  SUICIDE ATTEMPT:  A psychiatric evaluation was obtained by Dr.  Rhona Raider.  The patient agreed to inpatient treatment and is being  discharged to [facility].   Problem 3.  DIABETES MELLITUS:  Again his Glucophage was held due to his  overdose.  CBGs at discharge in the mid 130s.  Recommendations are to not to  restart any oral hypoglycemics until his blood sugars are consistently  greater than 130 for  two days straight without any IV fluids.  Hemoglobin  A1C was pending at discharge.   Problem 4.  HYPERTENSION:  The patient was previously on lisinopril that was  not continued during his hospitalization.  His blood pressure was under good  control.  This was not restarted prior to discharge.   DISCHARGE INSTRUCTIONS AND FOLLOW UP:  The patient is being discharged to  [facility] for inpatient psychiatric treatment.     Stephanie Martinique, NP                      Ashby Dawes. Polite, M.D.    Thane Edu  D:  02/28/2002  T:  02/28/2002  Job:  [id number]

## 2010-07-13 NOTE — Discharge Summary (Signed)
[place]. [facility]  Patient:    [name], [name]                        MRN: [id number] Adm. Date:  [id number] Disc. Date: [id number] Attending:  Starla Link CC:         [place] Hassell Done, M.D.             [facility]                           Discharge Summary  DATE OF BIRTH:  09/09/1960  DISCHARGE DIAGNOSES: 1. Pancreatitis, resolved. 2. Hypertension. 3. Status post right knee arthroscopic surgery.  DISCHARGE MEDICATIONS:  Hydrochlorothiazide 12.5 mg p.o. q.a.m.  FOLLOW-UP:  Follow-up has been scheduled with medicine outpatient clinic on Monday, July 30, at 3 oclock p.m., also with general surgery outpatient clinic Monday, July 23, at 12 oclock noon with Dr. Johnathan Hausen.  PROCEDURES:  July 9, KUB showing no free air.  July 9, EKG showing anterior infarction of unknown age.  July 11, right upper quadrant ultrasound positive for one large gallstone, negative for evidence of cholecystitis, ductal inflammation, or obstruction.  CONSULTANTS:  Isabel Caprice. Hassell Done, M.D., general surgery.  HISTORY OF PRESENT ILLNESS:  A 50 year old male admitted with abdominal pain and hematemesis.  MI ruled out by negative cardiac enzymes.  Lipase too high to read on admission, then 206 within several hours.  ALT elevated slightly but bilirubin and alkaline phosphatase within normal limits.  Patient denies history of ethanol intake currently and any other medications.  Exam showed positive moderate abdominal tenderness without peritoneal signs. DD:  09/10/99 TD:  09/10/99 Job: [id number]

## 2010-07-13 NOTE — Discharge Summary (Signed)
[place]. [facility]  Patient:    [name], [name]                        MRN: [id number] Adm. Date:  [id number] Disc. Date: [id number] Attending:  Starla Link Dictator:   Estill Bamberg, M.D.                           Discharge Summary  ADDENDUM  DATE OF BIRTH:  [birth year]  HOSPITAL COURSE:  The patient was kept n.p.o. overnight, given IV fluids, and diet was advanced without complications.  Right upper quadrant ultrasound showed positive large gallstone without evidence of cystic or ductal inflammation.  Surgery was consulted who recommended letting patients pancreas settle down before seeing him for possible cholecystectomy at a future date.  Patients blood pressure was slightly elevated without symptoms throughout hospitalization.  He was started on hydrochlorothiazide 12.5 mg p.o. q.a.m. without incident.  He was stable and discharged on September 05, 1999, with follow-up scheduled.  DISCHARGE LABORATORY DATA:  Lipase 22, AST 34, ALT 53, total cholesterol 160, triglycerides 139.  White blood cells, 4.3, hemoglobin 12.1, hematocrit 37.1, platelets 215, MCV 77.4.  Sodium 149, potassium 3.7, chloride 106, bicarbonate 28, glucose 126, BUN 8, creatinine 0.8. DD:  09/10/99 TD:  09/10/99 Job: [id number]

## 2010-07-13 NOTE — Consult Note (Signed)
[place]. [facility]  Patient:    [name], [name]                        MRN: [id number] Proc. Date: 09/05/99 Adm. Date:  [id number] Disc. Date: [id number] Attending:  Starla Link                          Consultation Report  REASON FOR CONSULTATION:  Consultation called for pancreatitis.  HISTORY:  This is a 50 year old black male who was admitted on September 03, 1999 with mid epigastric pain.  At the time of his admission,  his workup included an amylase which was drawn at 253 and a lipase of 206.  Other liver functions showed a GOT of 94, GPT of 41 and total bilirubin 0.6. In the course of his workup he was found to have solitary large gallstone within the gallbladder. No sonographic evidence of acute cholecystitis with no biliary ductal dilatation.  PHYSICAL EXAMINATION:  [name] was a very tall, large boned and heavy set black male currently in no acute distress.  His upper abdominal pain is resolved.  Abdomen is large.  No incisions and no rebound guarding or tenderness.  On questioning him he denies alcohol use.  He denies knowing anything about any blood dyscrasias i.e. sickle cell disease or its variants.  He does have a large gallstone which appears to be solitary.  The patient has had evidence of pancreatitis and it could possibly be due to a secondary small stone which was not seen migrating.  His elevation of his amylase was not that high.  I think he seems to be getting along very well at the present time and I would make him ready for discharge and let him go home and return to see [place] in the office and we can schedule an elective cholecystectomy at a suitable time possibly as an outpatient.  IMPRESSION:  Pancreatitis, etiology uncertain although likely biliary in nature - resolved.  PLAN:  Elective cholecystectomy. DD:  09/05/99 TD:  09/06/99 Job: [id number]

## 2010-07-13 NOTE — H&P (Signed)
[place]. [facility]  Patient:    [name], [name] Visit Number: [id number] MRN: [id number]          Service Type: EMS Location: [facility] Attending Physician:  Lacretia Leigh Dictated by:   Romero Liner, M.D. Admit Date:  09/11/2001 Discharge Date: 09/11/2001                           History and Physical  CHIEF COMPLAINT:  Chest pain.  HISTORY OF PRESENT ILLNESS:  A 50 year old African-American male complaining of chest pain earlier this evening which was followed by a syncope episode. His friends brought him to the ER. The patient described chest pain as pounding with a knot in his chest and sharp pain which lasted about 25 to 30 minutes. The patient admits to have this pain before over the previous three to four weeks. The patient states that holding his hands above his head helps. He has had this pain in the in the last couple of years. 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 The patient states that he had some diaphoresis with shortness of breath with todays episode. Positive for mouth dryness when he had the pain. No nausea or vomiting. He admits to smoking crack while playing cards tonight, and he denies any weight loss. Review of systems as above. Negative change in bowel or bladder. Negative peripheral edema. Admits to occasional PND and no DOE.  PAST MEDICAL HISTORY: 1. Diabetes. 2. Hypertension. 3. Denies any injury or concussion while playing football or baseball in high school. 4. He has a history of gallstones.  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  MEDICATIONS: 1. He takes one pill that is unknown for diabetes. 2. Lotensin 10 mg.  ALLERGIES:  No known drug allergies.  SOCIAL HISTORY:  Lives with dad who is blind. He denies any tobacco use or alcohol use. He graduated high school. He admits to smoking crack cocaine. States he only has one  sexual pattern; however, he is currently separated from his wife.  FAMILY HISTORY:  Significant for stroke, MI, diabetes. Sister stroke at 32. father stroke and diabetes. Mom died of stroke.  REVIEW OF SYSTEMS:  Positive for shortness of breath. Negative for dark tarry stools. Positive for dysuria. Positive chest pain. Negative headache. Negative abdominal pain except for right upper quadrant tenderness. Negative nausea. vomiting, diarrhea, constipation. Negative fevers. Positive anorexia, positive night sweats. Negative extremity edema and positive palpitations.  PHYSICAL EXAMINATION:  GENERAL:  He is alert and oriented x3. He is cooperative.  HEENT:  Pupils are equal, round and reactive to light. He has very poor dentition. No thyromegaly.  HEART:  Regular rate and rhythm without murmurs. No carotid bruits bilaterally. He has reproducible chest pain with palpation on the chest.  LUNGS:  Clear to auscultation bilaterally without wheezes, rales, or rhonchi.  VASCULAR:  Pulses are equal bilaterally and strong. No JVD.  NEURO:  Cranial nerves II-XII without focal deficits.  ABDOMEN:  Soft without masses. No guarding. No hepatosplenomegaly. He also has positive right upper quadrant pain with palpation.  ACCESSORY CLINICAL DATA:  EKG shows flipped T waves in V4 through 6, normal sinus rhythm, normal axis, and no LAD. Chest x-ray is pending. LABORATORY DATA:  Troponin, first set 0.0; CK total was 1,770; CK-MB was 13.0. CBC: White blood cells 4.6, hemoglobin 13.6, hematocrit 40.8, platelets  were 176. Sodium 137, potassium 3.3, chloride 106, bicarb 25, glucose 125, BUN 15, creatinine 0.9. The patients AST was 37, ALT was 25, lipase 14.  ASSESSMENT:  This is a 50 year old African-American male complaining of chest pain x1 today with history of cocaine use.  1. Chest pain most likely secondary to vasospasm secondary to crack cocaine use. Other things to consider:  MI,  costochondritis, myositis. The patient has multiple risk factors for CAD, family history, hypertension, crack use, diabetes. Also consider GI causes such as gastritis, GERD, cholecystitis, pancreatitis. Plan to get serial cardiac enzymes, EKG, and echo in the morning. Tylenol for pain as needed or morphine 2 to 6 mg q.4h. p.r.n. pain. The patient can use nitroglycerin sublingually if needed for chest pain. The patient also not started on beta blockers secondary to risk factors with interactions with cocaine. Low suspension for MI, will not start heparin. Serial enzymes and EKGs in a.m. Give Protonix for treatment of GERD. Check a urine drug screen and lipase. Risk stratification for coronary artery disease per cardiology consult. They recommended a 2-D echo tomorrow and a possible outpatient stress test. 2. Hypertension. Continue Lotensin 10 mg q.d. and monitor. 3. Diabetes. Fasting CBC in a.m. Consider hemoglobin A1c or have close followup with PCP. Check CBG a.c. and h.s., SSI for now. 4. Risk factors. Educate about cocaine use. Check a lipid panel. 5. Mild hypokalemia. Replete the patient. Dictated by:   Romero Liner, M.D. Attending Physician:  Lacretia Leigh DD:  09/10/01 TD:  09/13/01 Job: [id number]

## 2010-07-13 NOTE — Discharge Summary (Signed)
[place]. [facility]  Patient:    [name], [name] Visit Number: [id number] MRN: [id number]          Service Type: EMS Location: [facility] Attending Physician:  Lacretia Leigh Dictated by:   Romero Liner, M.D. Admit Date:  09/11/2001 Discharge Date: 09/11/2001   CC:         Lollie Sails, M.D., [facility]   Discharge Summary  DISCHARGE DIAGNOSES: 1. Chest pain, vasospasm secondary to cocaine use versus _________, mild    left ventricular hypertrophy. 2. Hypertension. 3. Diabetes mellitus. 4. Hyperlipidemia.  DISCHARGE MEDICATIONS: 1. Lotensin 10 mg p.o. b.i.d. 2. Aspirin 325 mg one p.o. q.d. 3. Ibuprofen 600 mg q.6h. p.r.n. pain. 4. Zocor 20 mg one tablet q.d.  FOLLOW-UP:  The patient has follow-up scheduled with primary care physician at [facility] on October 05, 2001, at 8:40 a.m.  In addition, the patient, per Nikki Dom, M.D., [facility], has a nuclear stress test as an outpatient scheduled; however, when Dr. Aquilla Hacker office attempted to contact the patient, his number was disconnected.  Follow-up on patients hypertension, diabetes, lipid management.  PROCEDURES AND DIAGNOSTIC STUDIES:  The patient had echo x-ray and chest x-ray.  CONSULTANTS:  Nikki Dom, M.D., [facility], cardiology.  ADMISSION HISTORY AND PHYSICAL:  Please see dictated H&P.  HOSPITAL COURSE:  The patient was admitted to the hospital on September 09, 2001, complaining of chest pain and one syncopal episode. The patient admits to use and addiction to crack cocaine. He denies any other drugs or tobacco or alcohol use.  The patient has multiple risk factors including family history, hypertension, diabetes, and hyperlipidemia.  The patients serial cardiac enzymes were essentially negative.  Chest x-ray revealed an upper limits of normal heart size and mild pulmonary vascular congestion.  The patient was given aspirin. The patients chest pain was  reproducible with palpation and resolved with nitroglycerin sublingually and was completely resolved by the next morning.  The patients echocardiogram showed mild LVH at 13 mm, mild mitral regurgitation, and his ejection fraction was between 55 to 65%.  Case management evaluated the patient who was interested in pursuing treatment for history cocaine addiction.  He was approved for a day treatment rehab and was told to arrive on Monday September 14, 2001, at 9:30 to the [facility] center which is located at [address].  The patient was also educated and counseled on his multiple risk factors for having additional cardiac event and strongly encouraged to follow up with primary care physician.     Dictated by Edger House, M.S. IV. Dictated by:   Romero Liner, M.D. Attending Physician:  Lacretia Leigh DD:  09/12/98 TD:  09/17/01 Job: [id number]

## 2010-07-13 NOTE — Discharge Summary (Signed)
[place]. [facility]  Patient:    [name], [name]                        MRN: [id number] Adm. Date:  [id number] Disc. Date: [id number] Attending:  Starla Link CC:         Coyle Hassell Done, M.D.             [facility]                           Discharge Summary  DATE OF BIRTH:  12-29-1960  DISCHARGE DIAGNOSES: 1. Pancreatitis, resolved. 2. Hypertension. 3. Status post right knee arthroscopic surgery.  DISCHARGE MEDICATIONS:  Hydrochlorothiazide 12.5 mg p.o. q.a.m.  FOLLOW-UP:  Follow-up is scheduled at the medicine outpatient clinic on Monday, July 30, at 3 oclock p.m., also appointment with general surgery outpatient clinic Monday, July 23, at 12 oclock noon with Dr. Johnathan Hausen. DD:  09/10/99 TD:  09/10/99 Job: [id number]

## 2010-11-15 LAB — POCT CARDIAC MARKERS
CKMB, poc: 4.1
Myoglobin, poc: 129
Operator id: 294341
Troponin i, poc: <0.05

## 2010-11-15 LAB — DIFFERENTIAL
Basophils Absolute: 0
Basophils Relative: 1
Eosinophils Absolute: 0
Eosinophils Relative: 1
Lymphocytes Relative: 51 — ABNORMAL HIGH
Lymphs Abs: 2.3
Monocytes Absolute: 0.5
Monocytes Relative: 11
Neutro Abs: 1.7
Neutrophils Relative %: 37 — ABNORMAL LOW

## 2010-11-15 LAB — CBC
HCT: 41.2
Hemoglobin: 13.6
MCHC: 32.9
MCV: 79.4
Platelets: 206
RBC: 5.19
RDW: 13.6
WBC: 4.6

## 2010-11-15 LAB — COMPREHENSIVE METABOLIC PANEL
ALT: 19
AST: 19
Albumin: 3.5
Alkaline Phosphatase: 96
BUN: 11
CO2: 20
Calcium: 9.4
Chloride: 100
Creatinine, Ser: 0.91
GFR calc Af Amer: >60
GFR calc non Af Amer: >60
Glucose, Bld: 388 — ABNORMAL HIGH
Potassium: 3.2 — ABNORMAL LOW
Sodium: 131 — ABNORMAL LOW
Total Bilirubin: 0.8
Total Protein: 7.8

## 2010-11-15 LAB — D-DIMER, QUANTITATIVE: D-Dimer, Quant: 0.49 — ABNORMAL HIGH

## 2010-11-15 LAB — B-NATRIURETIC PEPTIDE (CONVERTED LAB): Pro B Natriuretic peptide (BNP): 80

## 2011-05-10 ENCOUNTER — Emergency Department (HOSPITAL_COMMUNITY)
Admission: EM | Admit: 2011-05-10 | Discharge: 2011-05-12 | Disposition: A | Discharge status: Other Acute Inpatient Hospital | Payer: Medicaid Other | Attending: Emergency Medicine | Admitting: Emergency Medicine

## 2011-05-10 ENCOUNTER — Encounter (HOSPITAL_COMMUNITY): Payer: Self-pay | Admitting: *Deleted

## 2011-05-10 DIAGNOSIS — Z794 Long term (current) use of insulin: Secondary | ICD-10-CM | POA: Insufficient documentation

## 2011-05-10 DIAGNOSIS — F3289 Other specified depressive episodes: Secondary | ICD-10-CM | POA: Insufficient documentation

## 2011-05-10 DIAGNOSIS — F329 Major depressive disorder, single episode, unspecified: Secondary | ICD-10-CM | POA: Insufficient documentation

## 2011-05-10 DIAGNOSIS — E119 Type 2 diabetes mellitus without complications: Secondary | ICD-10-CM | POA: Insufficient documentation

## 2011-05-10 DIAGNOSIS — I10 Essential (primary) hypertension: Secondary | ICD-10-CM | POA: Insufficient documentation

## 2011-05-10 DIAGNOSIS — F32A Depression, unspecified: Secondary | ICD-10-CM

## 2011-05-10 HISTORY — DX: Essential (primary) hypertension: I10

## 2011-05-10 LAB — COMPREHENSIVE METABOLIC PANEL
ALT: 13 U/L (ref 0–53)
AST: 15 U/L (ref 0–37)
Albumin: 3.9 g/dL (ref 3.5–5.2)
Alkaline Phosphatase: 86 U/L (ref 39–117)
BUN: 12 mg/dL (ref 6–23)
CO2: 29 mEq/L (ref 19–32)
Calcium: 10 mg/dL (ref 8.4–10.5)
Chloride: 98 mEq/L (ref 96–112)
Creatinine, Ser: 0.86 mg/dL (ref 0.50–1.35)
GFR calc Af Amer: >90 mL/min (ref >90)
GFR calc non Af Amer: >90 mL/min (ref >90)
Glucose, Bld: 306 mg/dL — ABNORMAL HIGH (ref 70–99)
Potassium: 5.1 mEq/L (ref 3.5–5.1)
Sodium: 136 mEq/L (ref 135–145)
Total Bilirubin: 0.4 mg/dL (ref 0.3–1.2)
Total Protein: 8.4 g/dL — ABNORMAL HIGH (ref 6.0–8.3)

## 2011-05-10 LAB — ETHANOL: Alcohol, Ethyl (B): <11 mg/dL (ref 0–11)

## 2011-05-10 LAB — CBC
HCT: 44.3 % (ref 39.0–52.0)
Hemoglobin: 15 g/dL (ref 13.0–17.0)
MCH: 26 pg (ref 26.0–34.0)
MCHC: 33.9 g/dL (ref 30.0–36.0)
MCV: 76.9 fL — ABNORMAL LOW (ref 78.0–100.0)
Platelets: 219 10*3/uL (ref 150–400)
RBC: 5.76 MIL/uL (ref 4.22–5.81)
RDW: 13.8 % (ref 11.5–15.5)
WBC: 3.9 10*3/uL — ABNORMAL LOW (ref 4.0–10.5)

## 2011-05-10 LAB — ACETAMINOPHEN LEVEL: Acetaminophen (Tylenol), Serum: <15 ug/mL (ref 10–30)

## 2011-05-10 MED ORDER — ACETAMINOPHEN 325 MG PO TABS: 650.0000 mg | ORAL_TABLET | ORAL | Status: DC | PRN

## 2011-05-10 MED ORDER — BENAZEPRIL HCL 10 MG PO TABS
10.0000 mg | ORAL_TABLET | Freq: Every day | ORAL | Status: DC
  Administered 2011-05-11 – 2011-05-12 (×2): 10 mg via ORAL
  Filled 2011-05-10 (×3): qty 1

## 2011-05-10 MED ORDER — ZOLPIDEM TARTRATE 5 MG PO TABS: 5.0000 mg | ORAL_TABLET | Freq: Every evening | ORAL | Status: DC | PRN

## 2011-05-10 MED ORDER — ALUM & MAG HYDROXIDE-SIMETH 200-200-20 MG/5ML PO SUSP: 30.0000 mL | ORAL | Status: DC | PRN

## 2011-05-10 MED ORDER — ONDANSETRON HCL 4 MG PO TABS: 4.0000 mg | ORAL_TABLET | Freq: Three times a day (TID) | ORAL | Status: DC | PRN

## 2011-05-10 MED ORDER — LORAZEPAM 1 MG PO TABS: 1.0000 mg | ORAL_TABLET | Freq: Three times a day (TID) | ORAL | Status: DC | PRN

## 2011-05-10 MED ORDER — IBUPROFEN 600 MG PO TABS: 600.0000 mg | ORAL_TABLET | Freq: Three times a day (TID) | ORAL | Status: DC | PRN

## 2011-05-10 MED ORDER — SODIUM CHLORIDE 0.9 % IV BOLUS (SEPSIS)
1000.0000 mL | Freq: Once | INTRAVENOUS | Status: AC
Start: 2011-05-10 — End: 2011-05-11
  Administered 2011-05-11: 1000 mL via INTRAVENOUS

## 2011-05-10 NOTE — ED Provider Notes (Signed)
History     CSN: [id number]  Arrival date & time 05/10/11  [id number]   First MD Initiated Contact with Patient 05/10/11 2104      Chief Complaint  Patient presents with  . Medical Clearance    (Consider location/radiation/quality/duration/timing/severity/associated sxs/prior treatment) HPI Comments: Patient with a history of diabetes and hypertension presents emergency department with chief complaint of depression.  Patient states he's been suffering from depression his whole life.  Patient has attempted in the past to commit suicide with a gun.  Patient today is suicidal with the plan.  His plan was to cut himself, however he gave the security guard his knife.  Patient denies being on any medications.  Patient states that he no longer drinks alcohol or drugs.  Patient has been sober for over 5 years.  Patient denies homicidal ideations.  Patient has no medical complaints at this time.  The history is provided by the patient.    Past Medical History  Diagnosis Date  . Diabetes mellitus   . Hypertension     History reviewed. No pertinent past surgical history.  No family history on file.  History  Substance Use Topics  . Smoking status: Not on file  . Smokeless tobacco: Not on file  . Alcohol Use: No      Review of Systems  Constitutional: Negative for fever, chills and appetite change.  HENT: Negative for congestion.   Eyes: Negative for visual disturbance.  Respiratory: Negative for shortness of breath.   Cardiovascular: Negative for chest pain and leg swelling.  Gastrointestinal: Negative for abdominal pain.  Genitourinary: Negative for dysuria, urgency and frequency.  Neurological: Negative for dizziness, syncope, weakness, light-headedness, numbness and headaches.  Psychiatric/Behavioral: Positive for suicidal ideas and sleep disturbance. Negative for confusion.  All other systems reviewed and are negative.    Allergies  Review of patient's allergies indicates no  known allergies.  Home Medications   Current Outpatient Rx  Name Route Sig Dispense Refill  . BENAZEPRIL HCL 10 MG PO TABS Oral Take 10 mg by mouth daily.    . INSULIN ISOPHANE & REGULAR (70-30) 100 UNIT/ML [place] SUSP Subcutaneous Inject 20 Units into the skin 2 (two) times daily with a meal.    . METFORMIN HCL 500 MG PO TABS Oral Take 1,000 mg by mouth 2 (two) times daily with a meal.      BP 141/99  Pulse 100  Temp(Src) 97.6 F (36.4 C) (Oral)  Resp 18  Ht 5\' 8"  (1.727 m)  Wt 267 lb (121.11 kg)  BMI 40.60 kg/m2  SpO2 100%  Physical Exam  Constitutional: He is oriented to person, place, and time. He appears well-developed and well-nourished. No distress.  HENT:  Head: Normocephalic and atraumatic.  Mouth/Throat: Oropharynx is clear and moist. No oropharyngeal exudate.  Eyes: Conjunctivae and EOM are normal. Pupils are equal, round, and reactive to light. No scleral icterus.  Neck: Normal range of motion. Neck supple. No tracheal deviation present. No thyromegaly present.  Cardiovascular: Normal rate, regular rhythm, normal heart sounds and intact distal pulses.   Pulmonary/Chest: Effort normal and breath sounds normal. No stridor. No respiratory distress. He has no wheezes.  Abdominal: Soft.  Musculoskeletal: Normal range of motion. He exhibits no edema and no tenderness.  Neurological: He is alert and oriented to person, place, and time. Coordination normal.  Skin: Skin is warm and dry. No rash noted. He is not diaphoretic. No erythema. No pallor.  Psychiatric: He has a normal mood and  affect. His behavior is normal.    ED Course  Procedures (including critical care time)  Labs Reviewed  CBC - Abnormal; Notable for the following:    WBC 3.9 (*)    MCV 76.9 (*)    All other components within normal limits  COMPREHENSIVE METABOLIC PANEL - Abnormal; Notable for the following:    Glucose, Bld 306 (*)    Total Protein 8.4 (*)    All other components within normal limits    ETHANOL  ACETAMINOPHEN LEVEL  URINE RAPID DRUG SCREEN (HOSP PERFORMED)   No results found.   No diagnosis found.    MDM  Pt is medically cleared to move to behavior health. ACT team consulted as well as telpsych. Holding orders placed.  Pts sugar is 306 and is moving to main ED for fluids & SI. 1L bolus adn CBG monitoring  have been ordered.         [name], PA-C 05/10/11 [address], PA-C 05/10/11 2347

## 2011-05-10 NOTE — ED Notes (Signed)
Pt states that he "feels like he might kill himself".  Upon asking about a plan, the pt states "well, I just gave the security guard my knife".  Pt states that he "has been told he was depressed at a hospital one time".  Pt denies being on medications for said depression.  Pt states that his feeling of wanting to kill himself has "been real strong for the last couple days".

## 2011-05-10 NOTE — ED Notes (Signed)
Procedure for SI/HI pts explained to pt. Pt given paper scrubs and red socks. Pt expressed that he had a weapon on him. Pt's knife taken by security Ronalee Belts and locked with security. Pt expressed that he has not made final decision on whether he wants to stay and receive help. Pt informed that weapon will stay with security until he has made a decision. Pt cooperative.

## 2011-05-11 LAB — RAPID URINE DRUG SCREEN, HOSP PERFORMED
Amphetamines: NOT DETECTED
Barbiturates: NOT DETECTED
Benzodiazepines: NOT DETECTED
Cocaine: POSITIVE — AB
Opiates: NOT DETECTED
Tetrahydrocannabinol: NOT DETECTED

## 2011-05-11 LAB — GLUCOSE, CAPILLARY
Glucose-Capillary: 255 mg/dL — ABNORMAL HIGH (ref 70–99)
Glucose-Capillary: 233 mg/dL — ABNORMAL HIGH (ref 70–99)
Glucose-Capillary: 249 mg/dL — ABNORMAL HIGH (ref 70–99)
Glucose-Capillary: 264 mg/dL — ABNORMAL HIGH (ref 70–99)
Glucose-Capillary: 231 mg/dL — ABNORMAL HIGH (ref 70–99)

## 2011-05-11 MED ORDER — INSULIN ASPART 100 UNIT/ML ~~LOC~~ SOLN
SUBCUTANEOUS | Status: AC
  Filled 2011-05-11: qty 2

## 2011-05-11 MED ORDER — INSULIN ASPART 100 UNIT/ML ~~LOC~~ SOLN
0.0000 [IU] | Freq: Three times a day (TID) | SUBCUTANEOUS | Status: DC
  Administered 2011-05-11 (×2): 3 [IU] via SUBCUTANEOUS
  Administered 2011-05-11: 5 [IU] via SUBCUTANEOUS
  Administered 2011-05-12: 3 [IU] via SUBCUTANEOUS
  Administered 2011-05-12: 1 [IU] via SUBCUTANEOUS
  Administered 2011-05-12: 3 [IU] via SUBCUTANEOUS
  Filled 2011-05-11: qty 1

## 2011-05-11 MED ORDER — METFORMIN HCL 500 MG PO TABS
500.0000 mg | ORAL_TABLET | Freq: Every day | ORAL | Status: DC
  Administered 2011-05-11 – 2011-05-12 (×3): 500 mg via ORAL
  Filled 2011-05-11 (×3): qty 1

## 2011-05-11 MED ORDER — INSULIN ASPART 100 UNIT/ML ~~LOC~~ SOLN
SUBCUTANEOUS | Status: AC
  Administered 2011-05-11: 5 [IU] via SUBCUTANEOUS
  Filled 2011-05-11: qty 1

## 2011-05-11 MED ORDER — INSULIN ASPART 100 UNIT/ML ~~LOC~~ SOLN
0.0000 [IU] | Freq: Every day | SUBCUTANEOUS | Status: DC
  Administered 2011-05-11 (×2): 2 [IU] via SUBCUTANEOUS
  Filled 2011-05-11: qty 1

## 2011-05-11 MED ORDER — INSULIN ASPART 100 UNIT/ML ~~LOC~~ SOLN
SUBCUTANEOUS | Status: AC
  Filled 2011-05-11: qty 3

## 2011-05-11 MED ORDER — INSULIN ASPART 100 UNIT/ML ~~LOC~~ SOLN: 0.0000 [IU] | Freq: Three times a day (TID) | SUBCUTANEOUS | Status: DC

## 2011-05-11 NOTE — BH Assessment (Addendum)
Assessment Note   [name] is an 51 y.o. male. PT PRESENTS WITH INCREASE DEPRESSION & SUICIDAL THOUGHTS WITHOUT ANY SPECIFIC PLAN. PT EXPRESSED BEING DOWN OVER HOW HIS LIFE HAS BEEN IN THE PAST COUPLE OF YEAR & HIS THOUGHTS HAS WORSE OF A PERIOD OF TIME. PT ADMIT S TO AT LEAST 3 PRIOR ATTEMPTS WHICH HE HAS NEVER BEEN HOSPITALIZED FOR & IS NOT CURRENTLY SEEING A PROVIDER. PER NURSE, PT CAME IN WITH A KNIFE & WOULD ONLY RELEASE THE KNIFE AS WELL AS CHANGE INO SCRUBS IF HE WAS GIVEN SOMETHING TO EAT. PT HAS BEEN COOPERATIVE & HAD DENIED IDEATION TO NURSE BUT ADMITTED IT TO CLINICIAN. PT WAS SENT BY [facility] FOR MEDICAL CLEARANCE & PT IS STILL NOT MEDICALLY CLEARED DUE TO ELEVATED BLOOD SUGAR. PT EXPRESSED THAT HE WAS NOT SURE WHAT HE MIGHT DO & COULD NOT CONTRACT FOR SAFETY. PT DENIES ANY HI OR AV OR ANY SUBSTANCE USE. PT WAS REFERRED TO [facility] FOR REVIEW & PENDING DISPOSITION AT THIS MOMENT  Axis I: Depressive Disorder NOS Axis II: Deferred Axis III:  Past Medical History  Diagnosis Date  . Diabetes mellitus   . Hypertension    Axis IV: economic problems, other psychosocial or environmental problems, problems related to social environment and problems with primary support group Axis V: 11-20 some danger of hurting self or others possible OR occasionally fails to maintain minimal personal hygiene OR gross impairment in communication  Past Medical History:  Past Medical History  Diagnosis Date  . Diabetes mellitus   . Hypertension     History reviewed. No pertinent past surgical history.  Family History: No family history on file.  Social History:  does not have a smoking history on file. He does not have any smokeless tobacco history on file. He reports that he does not drink alcohol or use illicit drugs.  Additional Social History:    Allergies: No Known Allergies  Home Medications:  Medications Prior to Admission  Medication Dose Route Frequency Provider Last Rate Last  Dose  . acetaminophen (TYLENOL) tablet 650 mg  650 mg Oral Q4H PRN Lisette Paz, PA-C      . alum & mag hydroxide-simeth (MAALOX/MYLANTA) 200-200-20 MG/5ML suspension 30 mL  30 mL Oral PRN Lisette Paz, PA-C      . benazepril (LOTENSIN) tablet 10 mg  10 mg Oral Daily Lisette Paz, PA-C      . ibuprofen (ADVIL,MOTRIN) tablet 600 mg  600 mg Oral Q8H PRN Lisette Paz, PA-C      . LORazepam (ATIVAN) tablet 1 mg  1 mg Oral Q8H PRN Lisette Paz, PA-C      . ondansetron [facility]) tablet 4 mg  4 mg Oral Q8H PRN Lisette Paz, PA-C      . sodium chloride 0.9 % bolus 1,000 mL  1,000 mL Intravenous Once Lisette Paz, PA-C   1,000 mL at 05/11/11 0018  . zolpidem (AMBIEN) tablet 5 mg  5 mg Oral QHS PRN [name], PA-C       No current outpatient prescriptions on file as of 05/10/2011.    OB/GYN Status:  No LMP for male patient.  General Assessment Data Location of Assessment: [facility] ACT Assessment: Yes Living Arrangements: Alone Can pt return to current living arrangement?: Yes Admission Status: Voluntary     Risk to self Suicidal Ideation: Yes-Currently Present Suicidal Intent: Yes-Currently Present Is patient at risk for suicide?: Yes Suicidal Plan?: No Specify Current Suicidal Plan: NOT SURE HOW Access  to Means: No What has been your use of drugs/alcohol within the last 12 months?: NA Previous Attempts/Gestures: Yes How many times?: 3  Other Self Harm Risks: NA Triggers for Past Attempts: Unpredictable Intentional Self Injurious Behavior: None Family Suicide History: No Recent stressful life event(s): Turmoil (Comment) Persecutory voices/beliefs?: No Depression: Yes Depression Symptoms: Loss of interest in usual pleasures;Feeling worthless/self pity;Isolating Substance abuse history and/or treatment for substance abuse?: No Suicide prevention information given to non-admitted patients: Not applicable  Risk to Others Homicidal Ideation: No Thoughts of Harm to Others: No Current Homicidal  Intent: No Current Homicidal Plan: No Access to Homicidal Means: No Identified Victim: NA History of harm to others?: No Assessment of Violence: None Noted Violent Behavior Description: CALM, COOPERATIVE Does patient have access to weapons?: Yes (Comment) Criminal Charges Pending?: No Does patient have a court date: No  Psychosis Hallucinations: None noted Delusions: None noted  Mental Status Report Appear/Hygiene: Disheveled Eye Contact: Fair Motor Activity: Freedom of movement Speech: Logical/coherent Level of Consciousness: Alert Mood: Depressed;Anhedonia;Despair;Helpless Affect: Appropriate to circumstance;Depressed;Sad Anxiety Level: None Thought Processes: Coherent;Relevant Judgement: Impaired Orientation: Person;Place;Time;Situation Obsessive Compulsive Thoughts/Behaviors: None  Cognitive Functioning Concentration: Decreased Memory: Recent Intact;Remote Intact IQ: Average Insight: Poor Impulse Control: Poor Appetite: Fair Weight Loss: 0  Weight Gain: 0  Sleep: No Change Total Hours of Sleep: 5  Vegetative Symptoms: None  Prior Inpatient Therapy Prior Inpatient Therapy: No Prior Therapy Dates: NA Prior Therapy Facilty/Provider(s): NA Reason for Treatment: NA  Prior Outpatient Therapy Prior Outpatient Therapy: No Prior Therapy Dates: NA Prior Therapy Facilty/Provider(s): NA Reason for Treatment: NA            Values / Beliefs Cultural Requests During Hospitalization: None Spiritual Requests During Hospitalization: None        Additional Information 1:1 In Past 12 Months?: No CIRT Risk: No Elopement Risk: No Does patient have medical clearance?: Yes     Disposition:  Disposition Disposition of Patient: Inpatient treatment program;Referred to ([facility] FOR REVIEW) Type of inpatient treatment program: Adult  On Site Evaluation by:   Reviewed with Physician:     Blenda Nicely 05/11/2011 2:03 AM

## 2011-05-11 NOTE — ED Provider Notes (Signed)
Medical screening examination/treatment/procedure(s) were performed by non-physician practitioner and as supervising physician I was immediately available for consultation/collaboration.   Julianne Rice, MD 05/11/11 2128

## 2011-05-11 NOTE — ED Notes (Signed)
CBG 246 @22 :00

## 2011-05-11 NOTE — ED Notes (Signed)
Called md and informed of pt's blood sugar awaiting orders.

## 2011-05-11 NOTE — ED Notes (Signed)
Report received from night nurse.  Pt is asleep at this time. Will assess pt when he wakes up. At the moment no acute distress noted. Will continue to monitor.

## 2011-05-11 NOTE — BH Assessment (Signed)
Assessment Note    [name] is an 51 y.o. male. PT PRESENTS WITH INCREASE DEPRESSION & SUICIDAL THOUGHTS WITHOUT ANY SPECIFIC PLAN. PT EXPRESSED BEING DOWN OVER HOW HIS LIFE HAS BEEN IN THE PAST COUPLE OF YEAR & HIS THOUGHTS HAS WORSE OF A PERIOD OF TIME. PT ADMIT S TO AT LEAST 3 PRIOR ATTEMPTS WHICH HE HAS NEVER BEEN HOSPITALIZED FOR & IS NOT CURRENTLY SEEING A PROVIDER. PER NURSE, PT CAME IN WITH A KNIFE & WOULD ONLY RELEASE THE KNIFE AS WELL AS CHANGE INO SCRUBS IF HE WAS GIVEN SOMETHING TO EAT. PT HAS BEEN COOPERATIVE & HAD DENIED IDEATION TO NURSE BUT ADMITTED IT TO CLINICIAN. PT WAS SENT BY [facility] FOR MEDICAL CLEARANCE & PT IS STILL NOT MEDICALLY CLEARED DUE TO ELEVATED BLOOD SUGAR. PT EXPRESSED THAT HE WAS NOT SURE WHAT HE MIGHT DO & COULD NOT CONTRACT FOR SAFETY. PT DENIES ANY HI OR AV OR ANY SUBSTANCE USE.    Relayed clinical information to Raiford Simmonds, MD who agrees to admit Pt to [place] under the following conditions: 1) Pt's blood glucose is stabilized below 200 and 2) the ED MD provides recommendations for managing the Pt's blood glucose while he is at [facility]. Communicated this information to Pascal Lux, LCSW assessment counselor at [facility].   Anson Fret, Orpah Greek 05/11/2011 6:21 PM

## 2011-05-11 NOTE — BHH Counselor (Signed)
Dr. Scherrie Merritts at [facility] is willing to accept pt once EDP gets pt Blood Sugar below 200 and contacts [facility] with credible plan on how to treat Blood Sugar for when he arrives at [facility].  This is per Dr. Scherrie Merritts.  Incoming ACT will follow up with MD about note to determine if pt can be admitted tonight or needs continued eval.

## 2011-05-11 NOTE — ED Provider Notes (Signed)
Patient seen and examined this morning. He is resting comfortably.  Filed Vitals:   05/11/11 0801  BP: 121/72  Pulse: 85  Temp: 97.3 F (36.3 C)  Resp: 16    Heart: Regular rate and rhythm Lungs: Clear to auscultation.  Plan is for psychiatric admission at this time. We'll continue to monitor his blood sugar. Last blood sugar was 249  Kathalene Frames, MD 05/11/11 [phone]

## 2011-05-11 NOTE — ED Notes (Signed)
[id number] Expected date:<BR> Expected time:<BR> Means of arrival:<BR> Comments:<BR> Hold for TCU room 27

## 2011-05-12 ENCOUNTER — Inpatient Hospital Stay (HOSPITAL_COMMUNITY)
Admission: RE | Admit: 2011-05-12 | Discharge: 2011-05-20 | DRG: 885 | Disposition: A | Discharge status: Home / Self Care | Payer: Federal, State, Local not specified - Other | Source: Ambulatory Visit | Attending: Psychiatry | Admitting: Psychiatry

## 2011-05-12 ENCOUNTER — Encounter (HOSPITAL_COMMUNITY): Payer: Self-pay | Admitting: *Deleted

## 2011-05-12 DIAGNOSIS — Z79899 Other long term (current) drug therapy: Secondary | ICD-10-CM

## 2011-05-12 DIAGNOSIS — R45851 Suicidal ideations: Secondary | ICD-10-CM | POA: Diagnosis not present

## 2011-05-12 DIAGNOSIS — F3130 Bipolar disorder, current episode depressed, mild or moderate severity, unspecified: Secondary | ICD-10-CM | POA: Diagnosis present

## 2011-05-12 DIAGNOSIS — F429 Obsessive-compulsive disorder, unspecified: Secondary | ICD-10-CM | POA: Diagnosis not present

## 2011-05-12 DIAGNOSIS — E119 Type 2 diabetes mellitus without complications: Secondary | ICD-10-CM | POA: Diagnosis not present

## 2011-05-12 DIAGNOSIS — F39 Unspecified mood [affective] disorder: Secondary | ICD-10-CM | POA: Diagnosis present

## 2011-05-12 DIAGNOSIS — Z794 Long term (current) use of insulin: Secondary | ICD-10-CM

## 2011-05-12 DIAGNOSIS — I10 Essential (primary) hypertension: Secondary | ICD-10-CM

## 2011-05-12 LAB — GLUCOSE, CAPILLARY
Glucose-Capillary: 205 mg/dL — ABNORMAL HIGH (ref 70–99)
Glucose-Capillary: 207 mg/dL — ABNORMAL HIGH (ref 70–99)

## 2011-05-12 MED ORDER — MAGNESIUM HYDROXIDE 400 MG/5ML PO SUSP: 30.0000 mL | Freq: Every day | ORAL | Status: DC | PRN

## 2011-05-12 MED ORDER — TRAZODONE HCL 50 MG PO TABS
50.0000 mg | ORAL_TABLET | Freq: Every evening | ORAL | Status: DC | PRN
  Administered 2011-05-16 – 2011-05-19 (×3): 50 mg via ORAL
  Filled 2011-05-12 (×3): qty 1
  Filled 2011-05-12: qty 28

## 2011-05-12 MED ORDER — INSULIN ASPART 100 UNIT/ML ~~LOC~~ SOLN
SUBCUTANEOUS | Status: AC
  Administered 2011-05-12: 3 [IU] via SUBCUTANEOUS
  Filled 2011-05-12: qty 3

## 2011-05-12 MED ORDER — ACETAMINOPHEN 325 MG PO TABS: 650.0000 mg | ORAL_TABLET | Freq: Four times a day (QID) | ORAL | Status: DC | PRN

## 2011-05-12 MED ORDER — BENAZEPRIL HCL 10 MG PO TABS
10.0000 mg | ORAL_TABLET | Freq: Every day | ORAL | Status: DC
  Administered 2011-05-13 – 2011-05-20 (×8): 10 mg via ORAL
  Filled 2011-05-12 (×10): qty 1
  Filled 2011-05-12: qty 7

## 2011-05-12 MED ORDER — INSULIN ASPART PROT & ASPART (70-30 MIX) 100 UNIT/ML ~~LOC~~ SUSP
20.0000 [IU] | Freq: Two times a day (BID) | SUBCUTANEOUS | Status: DC
  Administered 2011-05-12: 20 [IU] via SUBCUTANEOUS
  Filled 2011-05-12: qty 10

## 2011-05-12 MED ORDER — METFORMIN HCL 500 MG PO TABS
500.0000 mg | ORAL_TABLET | Freq: Two times a day (BID) | ORAL | Status: DC
  Administered 2011-05-13 – 2011-05-15 (×5): 500 mg via ORAL
  Filled 2011-05-12 (×13): qty 1

## 2011-05-12 MED ORDER — ALUM & MAG HYDROXIDE-SIMETH 200-200-20 MG/5ML PO SUSP
30.0000 mL | ORAL | Status: DC | PRN
  Administered 2011-05-14: 30 mL via ORAL

## 2011-05-12 MED ORDER — INSULIN ASPART PROT & ASPART (70-30 MIX) 100 UNIT/ML ~~LOC~~ SUSP
20.0000 [IU] | Freq: Two times a day (BID) | SUBCUTANEOUS | Status: DC
  Administered 2011-05-12: 20 [IU] via SUBCUTANEOUS

## 2011-05-12 NOTE — Tx Team (Signed)
Initial Interdisciplinary Treatment Plan  PATIENT STRENGTHS: (choose at least two) Ability for insight Motivation for treatment/growth Supportive family/friends  PATIENT STRESSORS: Financial difficulties Substance abuse   PROBLEM LIST: Problem List/Patient Goals Date to be addressed Date deferred Reason deferred Estimated date of resolution  Depression  05-12-11           Suicide ideation  05-12-11           Hx of suicide attempts 05-12-11                              DISCHARGE CRITERIA:  Improved stabilization in mood, thinking, and/or behavior Reduction of life-threatening or endangering symptoms to within safe limits  PRELIMINARY DISCHARGE PLAN: Attend aftercare/continuing care group  PATIENT/FAMIILY INVOLVEMENT: This treatment plan has been presented to and reviewed with the patient, [name], and/or family member, .  The patient and family have been given the opportunity to ask questions and make suggestions.  Pricilla Larsson 05/12/2011, 7:05 PM

## 2011-05-12 NOTE — BHH Counselor (Signed)
231 Blood Sugar at 0931 per Nurse Kenney Houseman.  [place] to review with MD to determine if this is sufficient or does it need to be under 200 and for how long once under 200.  Pt has been accepted at [facility] and has a bed per Dr. Scherrie Merritts.  Dr. Scherrie Merritts acceptance last night was contingent on pt Blood Sugar being under 200 and that the ED MD speak with him or [facility] about a plan to manage the diabetes effectively once at [facility].

## 2011-05-12 NOTE — BHH Counselor (Signed)
Pt accepted to [facility] and will go by security.  Pt CBG is 141 and Dr. Scherrie Merritts accepted pt to Room 504-2 to the services of Dr. Gilford Rile.    ACT spoke to Dr. Dorna Mai who agreed to put into the chart the protocol for administering the insulin and protocol to follow to insure pt maintains a health regiment.

## 2011-05-12 NOTE — ED Notes (Signed)
Pt accepted at [facility] per Mortimer Fries, ACT, by Dr Scherrie Merritts to Dr Gilford Rile bed [phone]. Pt will follow his home medication regimen to keep sugars below 200 which is Novolin 70/30 at 20 units BID with a meal.

## 2011-05-12 NOTE — Progress Notes (Signed)
Met with pt 1:1 at start of this writer's shift. Pt is guarded, quiet and forwards little information. He is up and about the milieu though at times he returns to his room for brief periods. Refuses glucophage at 2000 stating, "I don't take it now." CBG at hs was 205. (pt does not have insulin coverage at hs). Pt given support, encouragement. Pt states he is "getting settled and wants to see how things run on the unit before opening up." Endorses passive SI but contracts for safety. No HI/AVH. No prn meds given. Jamie Kato

## 2011-05-12 NOTE — ED Provider Notes (Signed)
Pt is accepted to [facility] by Dr. Scherrie Merritts.  Pt can continue his usual home insulin which is 70/30 20 units twice daily and can use a standard sliding scale prior to meals for any breakthrough hyperglycemia as well as usual metformin as listed in MAR.    Saddie Benders. Skylah Delauter, MD 05/12/11 1427

## 2011-05-12 NOTE — Progress Notes (Signed)
[facility] Notes:  (Counselor/Nursing/MHT/Case Management/Adjunct)  05/12/2011 1315  Type of Therapy:  Group Therapy  Participation Level:  Did Not Attend  Rachelle Hora 05/12/2011, 4:29 PM

## 2011-05-12 NOTE — Progress Notes (Signed)
Patient ID: [name], male   DOB: Mar 22, 1960, 51 y.o.   MRN: [id number] 05-12-11 nursing adm note: pt came to bh voluntarily due to having depressive symptoms. He has been "down", had trouble sleeping and a decreased appetite. He has been having passive si ideation but was able to verbally contract. He has a hx of previous suicide attempts. Denied any hi/av.  He has a medical hx of diabetes with a cbg of 205 at 1633 on 05-12-11, hypertension and problems with both of his knees. He had knee pain he rated at 7/10 aching.  He is unemployed, separated from his wife and has no children.  He is a fall risk and precautions have been taken. He wears reading glasses but didn't bring them to this facility. He has no allergies, is a non smoker and denied any illegal drug or etoh use. His uds was positive for cocaine.  Labs are: cbc=wnl, wbc 3.9,mvc 76-09 low and a CMP with total protein at 8.4. He was polite/cooperative,given food/drink during adm and escorted to the 500 hall.   Contact person: tim Certain at cell ph # [phone]

## 2011-05-12 NOTE — ED Notes (Signed)
Per report from previous RN CBG must be below 200 before he can transfer to [facility]. Will discuss meds with pt and MD as there are differences between home and scheduled meds.

## 2011-05-13 DIAGNOSIS — F39 Unspecified mood [affective] disorder: Secondary | ICD-10-CM | POA: Diagnosis present

## 2011-05-13 LAB — GLUCOSE, CAPILLARY
Glucose-Capillary: 246 mg/dL — ABNORMAL HIGH (ref 70–99)
Glucose-Capillary: 231 mg/dL — ABNORMAL HIGH (ref 70–99)
Glucose-Capillary: 149 mg/dL — ABNORMAL HIGH (ref 70–99)
Glucose-Capillary: 205 mg/dL — ABNORMAL HIGH (ref 70–99)
Glucose-Capillary: 221 mg/dL — ABNORMAL HIGH (ref 70–99)
Glucose-Capillary: 345 mg/dL — ABNORMAL HIGH (ref 70–99)
Glucose-Capillary: 148 mg/dL — ABNORMAL HIGH (ref 70–99)

## 2011-05-13 MED ORDER — IBUPROFEN 200 MG PO TABS: 400.0000 mg | ORAL_TABLET | ORAL | Status: DC | PRN

## 2011-05-13 MED ORDER — INSULIN ASPART PROT & ASPART (70-30 MIX) 100 UNIT/ML ~~LOC~~ SUSP
20.0000 [IU] | SUBCUTANEOUS | Status: DC
  Administered 2011-05-13 – 2011-05-20 (×8): 20 [IU] via SUBCUTANEOUS

## 2011-05-13 MED ORDER — CITALOPRAM HYDROBROMIDE 20 MG PO TABS
20.0000 mg | ORAL_TABLET | Freq: Every day | ORAL | Status: DC
  Filled 2011-05-13 (×5): qty 1

## 2011-05-13 MED ORDER — INSULIN ASPART PROT & ASPART (70-30 MIX) 100 UNIT/ML ~~LOC~~ SUSP
20.0000 [IU] | Freq: Every day | SUBCUTANEOUS | Status: DC
  Administered 2011-05-13 – 2011-05-19 (×7): 20 [IU] via SUBCUTANEOUS

## 2011-05-13 MED ORDER — CARBAMAZEPINE 200 MG PO TABS
200.0000 mg | ORAL_TABLET | Freq: Three times a day (TID) | ORAL | Status: DC
  Administered 2011-05-13: 200 mg via ORAL
  Filled 2011-05-13 (×9): qty 1

## 2011-05-13 NOTE — H&P (Signed)
Medical/psychiatric screening examination/treatment/procedure(s) were performed by non-physician practitioner and as supervising physician I was immediately available for consultation/collaboration.   I have seen and examined this patient and agree with this evaluation.  

## 2011-05-13 NOTE — Tx Team (Signed)
Patient seen during d/c planning group.  He was guarded and would not provided information regarding any events that led to admission to the hospital.  He reports being here from [place] and having family in [place] but plans to return to [place].  He endorses off/on SII.  He denies ETOH/drug use.  Patient advised he was not seen by an outpatient services prior to admission.

## 2011-05-13 NOTE — Progress Notes (Signed)
Pt. Is very quiet and is in the dayroom with the other pts. Contracts and denies si or HI.

## 2011-05-13 NOTE — H&P (Signed)
Psychiatric Admission Assessment Adult  Patient Identification:  [name] Date of Evaluation:  05/13/2011 Chief Complaint:  DEPRESSIVE D/O,NOS  History of Present Illness:: This is a 51 year old African-American Male, admitted to [facility] from the [facility] ED with complaints of increased depression and suicidal ideations. Patient reports, "I came to this hospital because of suicidal thoughts. I do not know what brings it on. It just drags out of me some how. No, I did not try to hurt myself and or had a plan to kill me this time. However, I feel like shit. I had attempted to hurt myself with a gun prior and I also had tried to jump off of a building in the past. I don't take anything for my depression right now. I have not slept since I came here. I have not rested either. Every body keep coming in asking questions upon questions. What you all are doing with the answer I was giving to you, I don't know. The door keep opening every other minute. Some one keep looking in and checking back and forth. I am getting sick and tired of this shit"  Mood Symptoms:  Depression, Hopelessness, Past 2 Weeks, Sadness, SI, Depression Symptoms:  depressed mood, insomnia, (Hypo) Manic Symptoms:  Irritable Mood, Anxiety Symptoms:  Excessive Worry, Psychotic Symptoms:  Hallucinations: None  PTSD Symptoms: Had a traumatic exposure:  Denies any hx of traumatic events currently and or remotely.  Past Psychiatric History: Diagnosis: major depressive disorder, recurrent episode, severe  Hospitalizations: [facility]  Outpatient Care: [facility] in [place]  Substance Abuse Care: None reported  Self-Mutilation: None reported  Suicidal Attempts: "Yes, x 2 or 3 in the past"  Violent Behaviors: None reported   Past Medical History:   Past Medical History  Diagnosis Date  . Diabetes mellitus   . Hypertension    Cardiac History:  HTN Allergies:  No Known Allergies PTA  Medications: Prescriptions prior to admission  Medication Sig Dispense Refill  . benazepril (LOTENSIN) 10 MG tablet Take 10 mg by mouth daily.      . insulin NPH-insulin regular (NOVOLIN 70/30) (70-30) 100 UNIT/ML injection Inject 20 Units into the skin 2 (two) times daily with a meal.      . metFORMIN (GLUCOPHAGE) 500 MG tablet Take 1,000 mg by mouth 2 (two) times daily with a meal.        Previous Psychotropic Medications:  Medication/Dose  Metformin 1000 mg bid  Lotensin 10 mg daily             Substance Abuse History in the last 12 months: Substance Age of 1st Use Last Use Amount Specific Type  Nicotine Denies use     Alcohol Denies use     Cannabis Denies use     Opiates Denies use     Cocaine Denies use     Methamphetamines Denies use     LSD Denies use     Ecstasy Denies use     Benzodiazepines Denies use     Caffeine      Inhalants      Others:                         Consequences of Substance Abuse: Medical Consequences:  Liver damage, Possible death by overdose Legal Consequences:  Arrests, jail times, loss of driving privilege Family Consequences:  Family discord Withdrawal Symptoms:   None  Social History: Current Place of Residence: [place]  Place of Birth:  [place] Family Members: None reported Marital Status:  Separated Children: 0  Sons: 0  Daughters: 0 Relationships: "I am separated" Education:  HS Soil scientist Problems/Performance: "I completed high school" Religious Beliefs/Practices: None reported History of Abuse (Emotional/Phsycial/Sexual): Denies report 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 Legal History: None reported Hobbies/Interests: None reported  Family History:  No family history on file.  Mental Status Examination/Evaluation: Objective:  Appearance: Casual, Fairly Groomed and missing and some crooked teeth.  Eye Contact::  Fair  Speech:  Clear and Coherent  Volume:  Normal  Mood:   Depressed and Irritable, "I feel like shit"  Affect:  Blunt and Flat  Thought Process:  Coherent  Orientation:  Full  Thought Content:  Rumination  Suicidal Thoughts:  Yes.  without intent/plan  Homicidal Thoughts:  No  Memory:  Immediate;   Fair Recent;   Fair Remote;   Fair  Judgement:  Poor  Insight:  Poor  Psychomotor Activity:  Agitated  Concentration:  Poor  Recall:  Fair  Akathisia:  No  Handed:  Right  AIMS (if indicated):     Assets:  Desire for Improvement  Sleep:  Number of Hours: 5.5     Laboratory/X-Ray: None Psychological Evaluation(s)      Assessment:    AXIS I:  Bipolar, Depressed AXIS II:  Deferred AXIS III:   Past Medical History  Diagnosis Date  . Diabetes mellitus   . Hypertension    AXIS IV:  other psychosocial or environmental problems AXIS V:  41-50 serious symptoms  Treatment Plan/Recommendations: Admit for safety and stabilization.                                                               Review and reinstate any pertinent home medications for other                                                                 health problems.                                                               Obtain Vitamin D levels and HGBA1C.  Treatment Plan Summary: Daily contact with patient to assess and evaluate symptoms and progress in treatment Medication management Current Medications:  Current Facility-Administered Medications  Medication Dose Route Frequency Provider Last Rate Last Dose  . acetaminophen (TYLENOL) tablet 650 mg  650 mg Oral Q6H PRN Jimmye Norman, PA      . alum & mag hydroxide-simeth (MAALOX/MYLANTA) 200-200-20 MG/5ML suspension 30 mL  30 mL Oral Q4H PRN Jimmye Norman, PA      . benazepril (LOTENSIN) tablet 10 mg  10 mg Oral Daily Jimmye Norman, PA   10 mg at 05/13/11 0937  . citalopram (CELEXA) tablet 20 mg  20 mg Oral Daily Encarnacion Slates, NP      .  insulin aspart protamine-insulin aspart (NOVOLOG 70/30) injection 20 Units  20 Units  Subcutaneous Delle Reining, [place]   20 Units at 05/13/11 0650  . insulin aspart protamine-insulin aspart (NOVOLOG 70/30) injection 20 Units  20 Units Subcutaneous QAC supper Jimmye Norman, PA      . magnesium hydroxide (MILK OF MAGNESIA) suspension 30 mL  30 mL Oral Daily PRN Jimmye Norman, PA      . metFORMIN (GLUCOPHAGE) tablet 500 mg  500 mg Oral BID WC Jimmye Norman, PA   500 mg at 05/13/11 0735  . traZODone (DESYREL) tablet 50 mg  50 mg Oral QHS PRN Jimmye Norman, PA      . DISCONTD: insulin aspart protamine-insulin aspart (NOVOLOG 70/30) injection 20 Units  20 Units Subcutaneous BID WC Jimmye Norman, PA   20 Units at 05/12/11 1821   Facility-Administered Medications Ordered in Other Encounters  Medication Dose Route Frequency Provider Last Rate Last Dose  . DISCONTD: acetaminophen (TYLENOL) tablet 650 mg  650 mg Oral Q4H PRN Lisette Paz, PA-C      . DISCONTD: alum & mag hydroxide-simeth (MAALOX/MYLANTA) 200-200-20 MG/5ML suspension 30 mL  30 mL Oral PRN Lisette Paz, PA-C      . DISCONTD: benazepril (LOTENSIN) tablet 10 mg  10 mg Oral Daily Lisette Paz, PA-C   10 mg at 05/12/11 0945  . DISCONTD: ibuprofen (ADVIL,MOTRIN) tablet 600 mg  600 mg Oral Q8H PRN Lisette Paz, PA-C      . DISCONTD: insulin aspart (novoLOG) injection 0-5 Units  0-5 Units Subcutaneous QHS Chauncy Passy, MD   2 Units at 05/11/11 2213  . DISCONTD: insulin aspart (novoLOG) injection 0-9 Units  0-9 Units Subcutaneous TID WC Chauncy Passy, MD   1 Units at 05/12/11 1314  . DISCONTD: insulin aspart protamine-insulin aspart (NOVOLOG 70/30) injection 20 Units  20 Units Subcutaneous BID WC Saddie Benders. Ghim, MD   20 Units at 05/12/11 0937  . DISCONTD: LORazepam (ATIVAN) tablet 1 mg  1 mg Oral Q8H PRN Lisette Paz, PA-C      . DISCONTD: metFORMIN (GLUCOPHAGE) tablet 500 mg  500 mg Oral Q breakfast Chauncy Passy, MD   500 mg at 05/12/11 0913  . DISCONTD: ondansetron (ZOFRAN) tablet 4 mg  4 mg Oral Q8H PRN Lisette Paz, PA-C      . DISCONTD: zolpidem (AMBIEN)  tablet 5 mg  5 mg Oral QHS PRN Lisette Paz, PA-C        Observation Level/Precautions:  Q 15 minutes checks  Laboratory:  Obtain Vitamin d levels, HGBAIC  Psychotherapy:  Group  Medications:  See medication lists  Routine PRN Medications:  Yes  Consultations:  None indicated at this time  Discharge Concerns:  Safety  Other:     Lindell Spar I 3/18/20131:10 PM

## 2011-05-13 NOTE — Progress Notes (Signed)
[facility] Notes:  (Counselor/Nursing/MHT/Case Management/Adjunct)  05/13/2011 12:02 PM  Type of Therapy:  Group Therapy  Participation Level:  Did Not Attend     [name] 05/13/2011, 12:02 PM

## 2011-05-13 NOTE — B[facility] Suicide Risk Assessment (Signed)
Suicide Risk Assessment  Admission Assessment     Demographic factors:  Assessment Details Time of Assessment: Admission Information Obtained From: Patient Current Mental Status:  Current Mental Status: Suicidal ideation indicated by patient Loss Factors:  Loss Factors: Decrease in vocational status;Decline in physical health;Financial problems / change in socioeconomic status Historical Factors:  Historical Factors: Prior suicide attempts Risk Reduction Factors:  Risk Reduction Factors: Sense of responsibility to family  CLINICAL FACTORS:   Severe Anxiety and/or Agitation Bipolar Disorder:   Bipolar II  COGNITIVE FEATURES THAT CONTRIBUTE TO RISK:  Thought constriction (tunnel vision)    SUICIDE RISK:   Moderate:  Frequent suicidal ideation with limited intensity, and duration, some specificity in terms of plans, no associated intent, good self-control, limited dysphoria/symptomatology, some risk factors present, and identifiable protective factors, including available and accessible social support.  Reason for hospitalization: .Suicidal thoughts of increasing intensity  Diagnosis:  Axis I: Bipolar, Depressed  ADL's:  Intact  Sleep: Poor  Appetite:  Poor  Suicidal Ideation:  Pt reports fleeting suicidal thoughts Homicidal Ideation:  Denies adamantly any homicidal thoughts.  Mental Status Examination/Evaluation: Objective:  Appearance: Casual  Eye Contact::  Good  Speech:  Clear and Coherent  Volume:  Normal  Mood:  5 or 6/10 on a scale of 1 is the best and 10 is the worst  Anxiety: 1/10 on the same scale  Affect:  Congruent  Thought Process:  Coherent  Orientation:  Full  Thought Content:  WDL  Suicidal Thoughts:  Yes.  without intent/plan  Homicidal Thoughts:  No  Memory:  Immediate;   Good  Judgement:  Fair  Insight:  Fair  Psychomotor Activity:  Normal  Concentration:  Fair  Recall:  Fair  Akathisia:  No  Handed:  Right  AIMS (if indicated):     Assets:   Communication Skills Desire for Improvement Housing Intimacy Leisure Time Physical Health Social Support Talents/Skills  Sleep:  Number of Hours: 5.5     Vital Signs: Blood pressure 123/85, pulse 82, temperature 97.7 F (36.5 C), temperature source Oral, resp. rate 16. Current Medications:  Current Facility-Administered Medications  Medication Dose Route Frequency Provider Last Rate Last Dose  . alum & mag hydroxide-simeth (MAALOX/MYLANTA) 200-200-20 MG/5ML suspension 30 mL  30 mL Oral Q4H PRN Jimmye Norman, PA      . benazepril (LOTENSIN) tablet 10 mg  10 mg Oral Daily Jimmye Norman, PA   10 mg at 05/13/11 0937  . carbamazepine (TEGRETOL) tablet 200 mg  200 mg Oral TID Darrol Jump, MD      . citalopram (CELEXA) tablet 20 mg  20 mg Oral Daily Encarnacion Slates, NP      . ibuprofen (ADVIL,MOTRIN) tablet 400 mg  400 mg Oral Q4H PRN Darrol Jump, MD      . insulin aspart protamine-insulin aspart (NOVOLOG 70/30) injection 20 Units  20 Units Subcutaneous Delle Reining, [place]   20 Units at 05/13/11 0650  . insulin aspart protamine-insulin aspart (NOVOLOG 70/30) injection 20 Units  20 Units Subcutaneous QAC supper Jimmye Norman, PA   20 Units at 05/13/11 1709  . magnesium hydroxide (MILK OF MAGNESIA) suspension 30 mL  30 mL Oral Daily PRN Jimmye Norman, PA      . metFORMIN (GLUCOPHAGE) tablet 500 mg  500 mg Oral BID WC Jimmye Norman, PA   500 mg at 05/13/11 1708  . traZODone (DESYREL) tablet 50 mg  50 mg Oral QHS PRN Jimmye Norman, PA      .  DISCONTD: acetaminophen (TYLENOL) tablet 650 mg  650 mg Oral Q6H PRN Jimmye Norman, PA      . DISCONTD: insulin aspart protamine-insulin aspart (NOVOLOG 70/30) injection 20 Units  20 Units Subcutaneous BID WC Jimmye Norman, PA   20 Units at 05/12/11 1821    Lab Results:  Results for orders placed during the hospital encounter of 05/12/11 (from the past 48 hour(s))  GLUCOSE, CAPILLARY     Status: Abnormal   Collection Time   05/12/11  4:34 PM      Component Value Range Comment    Glucose-Capillary 205 (*) 70 - 99 (mg/dL)    Comment 1 Documented in Chart      Comment 2 Notify RN     GLUCOSE, CAPILLARY     Status: Abnormal   Collection Time   05/12/11  9:42 PM      Component Value Range Comment   Glucose-Capillary 207 (*) 70 - 99 (mg/dL)   GLUCOSE, CAPILLARY     Status: Abnormal   Collection Time   05/13/11  6:35 AM      Component Value Range Comment   Glucose-Capillary 205 (*) 70 - 99 (mg/dL)   GLUCOSE, CAPILLARY     Status: Abnormal   Collection Time   05/13/11 11:17 AM      Component Value Range Comment   Glucose-Capillary 221 (*) 70 - 99 (mg/dL)   GLUCOSE, CAPILLARY     Status: Abnormal   Collection Time   05/13/11  4:56 PM      Component Value Range Comment   Glucose-Capillary 345 (*) 70 - 99 (mg/dL)    Physical Findings: AIMS:   CIWA:     COWS:      Treatment Plan Summary: Daily contact with patient to assess and evaluate symptoms and progress in treatment Medication management  Risk of harm to self is elevated by his diagnosis.  He has attempted suicide by jumping off building, pulling the trigger on a gun and tried to walk into traffic in psst attempts. He feels that he would get right back to the point of wanting to end up dead.  He doesn't see any reason to live right now.  Risk of harm to others is elevated in that he has been in prison once for selling dope to the police and jail a couple of times for unauthorized use of motor vehicle, he has been involved in some fights.  Plan: We will admit the patient for crisis stabilization and treatment. I talked to pt about starting Tegretol for bipolar disorder.  Discussed other options of Lithium, Lamictal, Depakote, Seroquel, Geodon, and Abilify also for bipolar disorder. I explained the risks and benefits of medication in detail. I answered his questions and about how these medications might have an effect on the kidneys, heart, and brain like his diabetes has on the same end organs.  He was agreeable  to trying the Tegretol. We will continue on q. 15 checks the unit protocol. At this time there is no clinical indication for one-to-one observation as patient contract for safety and presents little risk to harm themself and others.  We will increase collateral information. I encourage patient to participate in group milieu therapy. Pt will be seen in treatment team meeting tomorrow morning for further treatment and appropriate discharge planning. Please see history and physical note for more detailed information ELOS: 3 to 5 days.   Gilford Rile, Hue Frick 05/13/2011, 6:47 PM

## 2011-05-13 NOTE — Tx Team (Signed)
Interdisciplinary Treatment Plan Update (Adult)  Date:  05/13/2011  Time Reviewed:  10:29 AM   Progress in Treatment: Attending groups:   Yes   Participating in groups:  Yes Taking medication as prescribed:  Yes Tolerating medication:  Yes Family/Significant othe contact made: Counselor to assess for contact with family Patient understands diagnosis:  Yes Discussing patient identified problems/goals with staff: Yes Medical problems stabilized or resolved: Yes Denies suicidal/homicidal ideation:Yes Issues/concerns per patient self-inventory:  Other:  New problem(s) identified:  Reason for Continuation of Hospitalization: Anxiety Depression Medication stabilization Suicidal ideation  Interventions implemented related to continuation of hospitalization:  Medication Management; safety checks q 15 mins  Additional comments:  Estimated length of stay:  Discharge Plan:  New goal(s):  Review of initial/current patient goals per problem list:   1.  Goal(s):  Eliminate SI  Met:  No  Target date: d/c  As evidenced by:  Patient will no longer endorse SI  2.  Goal (s):  Reduce depression/anxiety  Met:  No  Target date: d/c  As evidenced by: Jenny Reichmann will rate depression at four or less on discharge  3.  Goal(s):  Stabilize on medications  Met:  No  Target date:  d/c  As evidenced by: Patient will report being stable on medication - symptoms improved  4.  Goal(s):  Refer for outpatient follow   Met:  No  Target date: d/c  As evidenced by:  Outpatient follow up to be scheduled  Attendees: Patient:     Family:     Physician:  Rudean Curt, MD 05/13/2011 10:29 AM   Nursing:    05/13/2011 10:29 AM   CaseManager:  Joette Catching, LCSW 05/13/2011 10:29 AM   Counselor:  Lillie Fragmin, LCSW 05/13/2011 10:29 AM   Other:  Zane Herald, NP 05/13/2011 10:29 AM   Other:  Regan Lemming, LCSWA 05/13/2011  10:29 AM   Other:     Other:      Scribe for Treatment Team:     Concha Pyo, LCSW,  05/13/2011 10:29 AM

## 2011-05-13 NOTE — Progress Notes (Signed)
[facility] Notes: (Counselor/Nursing/MHT/Case Management/Adjunct) 05/13/2011   @1 :15pm  Type of Therapy:  Group Therapy  Participation Level:  Active  Participation Quality:  Attentive, appropriate  Affect:  Blunted  Cognitive:  Appropriate  Insight:  Limited  Engagement in Group: Good  Engagement in Therapy:  Limited  Modes of Intervention:  Support and Exploration  Summary of Progress/Problems: [name] participated in focused breathing and progressive muscle relaxation exercises. He reported that they helped him feel less tense, but declined to process his experience any further.   Benard Halsted 05/13/2011 2:53 PM

## 2011-05-13 NOTE — Progress Notes (Signed)
Patient ID: [name], male   DOB: 07-07-1960, 51 y.o.   MRN: [id number] Patient's self inventory sheet, has poor sleep, "how do you sleep when the door keeps opening ever few min.".  Has poor appetite, normal energy level, good attention span.   Depression not good, hopelessness #9.  SI off/on, contracts for safety.  No physical problems in past 24 hours.  Worst pain #1.  Not sure how to better take care of himself after discharge.  No questions for staff.  Does not have discharge plans.  No problems taking meds after discharge. Did not want to take BP med this morning, and later took BP med.

## 2011-05-14 DIAGNOSIS — F3130 Bipolar disorder, current episode depressed, mild or moderate severity, unspecified: Principal | ICD-10-CM

## 2011-05-14 LAB — GLUCOSE, CAPILLARY
Glucose-Capillary: 218 mg/dL — ABNORMAL HIGH (ref 70–99)
Glucose-Capillary: 142 mg/dL — ABNORMAL HIGH (ref 70–99)
Glucose-Capillary: 204 mg/dL — ABNORMAL HIGH (ref 70–99)
Glucose-Capillary: 229 mg/dL — ABNORMAL HIGH (ref 70–99)

## 2011-05-14 LAB — VITAMIN D 25 HYDROXY (VIT D DEFICIENCY, FRACTURES): Vit D, 25-Hydroxy: 15 ng/mL — ABNORMAL LOW (ref 30–89)

## 2011-05-14 LAB — HEMOGLOBIN A1C
Hgb A1c MFr Bld: 13 % — ABNORMAL HIGH (ref <5.7)
Mean Plasma Glucose: 326 mg/dL — ABNORMAL HIGH (ref <117)

## 2011-05-14 MED ORDER — CITALOPRAM HYDROBROMIDE 10 MG/5ML PO SOLN
10.0000 mg | Freq: Every day | ORAL | Status: DC
  Administered 2011-05-14 – 2011-05-15 (×2): 10 mg via ORAL
  Filled 2011-05-14 (×4): qty 10

## 2011-05-14 MED ORDER — CARBAMAZEPINE 200 MG PO TABS
200.0000 mg | ORAL_TABLET | Freq: Three times a day (TID) | ORAL | Status: DC
  Administered 2011-05-14 – 2011-05-16 (×7): 200 mg via ORAL
  Filled 2011-05-14 (×11): qty 1

## 2011-05-14 MED FILL — Insulin Aspart Prot & Aspart (Human) Inj 100 Unit/ML (70-30): SUBCUTANEOUS | Qty: 0.2 | Status: AC

## 2011-05-14 NOTE — BHH Counselor (Signed)
Adult Comprehensive Assessment  Patient ID: [name], male   DOB: [birth year], 51 y.o.   MRN: [id number]  Information Source: Information source: Patient  Current Stressors:  Educational / Learning stressors: no stressors reported Employment / Job issues: on medical leave from work Family Relationships: closes family out Museum/gallery curator / Lack of resources (include bankruptcy): out of work on medical leave Housing / Lack of housing: no stressors reported Physical health (include injuries & life threatening diseases): diabetes and knee problems/pain Social relationships: does not let others in, isolates himself Substance abuse: denies - UDS + for cocaine Bereavement / Loss: loss of marriage, inability to work  Living/Environment/Situation:  Living Arrangements: Alone Living conditions (as described by patient or guardian): lives alone in [place] How long has patient lived in current situation?: since the separation What is atmosphere in current home: Comfortable  Family History:  Marital status: Separated Separated, when?: will not specify - fairly recent What types of issues is patient dealing with in the relationship?: will not specify Does patient have children?: No  Childhood History:  By whom was/is the patient raised?: Father Description of patient's relationship with caregiver when they were a child: okay - normal Patient's description of current relationship with people who raised him/her: good with father Does patient have siblings?: Yes Number of Siblings: 2  Description of patient's current relationship with siblings: brother and sister, close if he would let them in Did patient suffer any verbal/emotional/physical/sexual abuse as a child?: No Did patient suffer from severe childhood neglect?: No Has patient ever been sexually abused/assaulted/raped as an adolescent or adult?: No Was the patient ever a victim of a crime or a disaster?: No Witnessed domestic violence?:  No Has patient been effected by domestic violence as an adult?: No  Education:  Highest grade of school patient has completed: high school graduate Currently a Ship broker?: No Learning disability?: No  Employment/Work Situation:   Employment situation:  (out on medical leave - was hurt on the job) Patient's job has been impacted by current illness: Yes Describe how patient's job has been implacted: unable to work - has had to go on leave for being hurt on the job Has patient ever been in the [employer]?: No Has patient ever served in Recruitment consultant?: No  Financial Resources:   Financial resources: Pharmacist, community Does patient have a Programmer, applications or guardian?: No  Alcohol/Substance Abuse:   What has been your use of drugs/alcohol within the last 12 months?: reports no substance abuse, but cocaine present in UDS If attempted suicide, did drugs/alcohol play a role in this?: No Alcohol/Substance Abuse Treatment Hx: Denies past history Has alcohol/substance abuse ever caused legal problems?: No  Social Support System:   Pensions consultant Support System: Fair Astronomer System: "my family if I would let the in" Type of faith/religion: None How does patient's faith help to cope with current illness?: N/A  Leisure/Recreation:   Leisure and Hobbies: cannot identify any  Strengths/Needs:   What things does the patient do well?: cannot identify any strengths  In what areas does patient struggle / problems for patient: having a lot of suicidal thoughts, "a whole lot of crazy stuff" going on in his life,   Discharge Plan:   Does patient have access to transportation?:  (unsure whether he has a way to get back to [place]) Will patient be returning to same living situation after discharge?: Yes Currently receiving community mental health services: No [facility]) If no, would patient like  referral for services when discharged?: Yes (What  county?) Does patient have financial barriers related to discharge medications?: No  Summary/Recommendations:   Summary and Recommendations (to be completed by the evaluator): [name] is a 51 year old separated male diagnosed with Major Depressive Disorder. He reports that suicidal thoughts just come about with no trigger or stressing event, and that he did not plan or attempt this time, but has in the past. Very vague with his answers and he will not answer some background questions. Delante would benefit from crisis stabilization, medication evaluation, therapy   Benard Halsted. 05/14/2011

## 2011-05-14 NOTE — Progress Notes (Signed)
Inpatient Diabetes Program Recommendations  AACE/ADA: New Consensus Statement on Inpatient Glycemic Control (2009)  Target Ranges:  Prepandial:   less than 140 mg/dL      Peak postprandial:   less than 180 mg/dL (1-2 hours)      Critically ill patients:  140 - 180 mg/dL   Reason for Visit: Hyperglycemia  Results for [name], [name] (MRN [id number]) as of 05/14/2011 09:39  Ref. Range 05/12/2011 16:34 05/12/2011 21:42 05/13/2011 06:35 05/13/2011 11:17 05/13/2011 16:56 05/13/2011 22:10 05/14/2011 06:16  Glucose-Capillary Latest Range: 70-99 mg/dL 205 (H) 207 (H) 205 (H) 221 (H) 345 (H) 148 (H) 218 (H)  Results for [name], [name] (MRN [id number]) as of 05/14/2011 09:39  Ref. Range 05/13/2011 19:45  Hemoglobin A1C Latest Range: <5.7 % 13.0 (H)    Inpatient Diabetes Program Recommendations Correction (SSI): Add Novolog moderate tidwc and hs Oral Agents: Increase metformin to home dose - 1000 mg bid  Note: Encourage pt to make healthy food choices for CHO-mod medium diet.

## 2011-05-14 NOTE — Progress Notes (Addendum)
[facility] MD Progress Note  05/14/2011 3:29 PM  Diagnosis:  Axis I: Bipolar, Depressed and Obsessive Compulsive Disorder  ADL's:  Intact  Sleep: Poor, noted upset stomach after taking sleeping med  Appetite:  Poor  Suicidal Ideation:  Has had an occasional thought of self harm today.  Actually got scared in group about would he be ready to leave and do well outside of here. Homicidal Ideation:  Denies adamantly any homicidal thoughts.  Mental Status Examination/Evaluation: Objective:  Appearance: Casual  Eye Contact::  Good  Speech:  Clear and Coherent  Volume:  Normal  Mood:  1 /10 on a scale of 1 is the best and 10 is the worst  Anxiety 3/10 on the same scale  Affect:  Congruent  Thought Process:  Coherent  Orientation:  Full  Thought Content:  WDL  Suicidal Thoughts:  No  Homicidal Thoughts:  No  Memory:  Immediate;   Fair  Judgement:  Fair  Insight:  Fair  Psychomotor Activity:  Normal  Concentration:  Fair  Recall:  Fair  Akathisia:  No  Handed:  Right  AIMS (if indicated):     Assets:  Communication Skills Desire for Improvement Resilience  Sleep:  Number of Hours: 6    Vital Signs:Blood pressure 136/92, pulse 82, temperature 97.5 F (36.4 C), temperature source Oral, resp. rate 16. Current Medications: Current Facility-Administered Medications  Medication Dose Route Frequency Provider Last Rate Last Dose  . alum & mag hydroxide-simeth (MAALOX/MYLANTA) 200-200-20 MG/5ML suspension 30 mL  30 mL Oral Q4H PRN Jimmye Norman, PA   30 mL at 05/14/11 1144  . benazepril (LOTENSIN) tablet 10 mg  10 mg Oral Daily Jimmye Norman, PA   10 mg at 05/14/11 0811  . carbamazepine (TEGRETOL) tablet 200 mg  200 mg Oral TID Darrol Jump, MD      . citalopram (CELEXA) 10 MG/5ML suspension 10 mg  10 mg Oral Daily Darrol Jump, MD      . ibuprofen (ADVIL,MOTRIN) tablet 400 mg  400 mg Oral Q4H PRN Darrol Jump, MD      . insulin aspart protamine-insulin aspart (NOVOLOG 70/30) injection 20  Units  20 Units Subcutaneous Delle Reining, [place]   20 Units at 05/14/11 [phone]  . insulin aspart protamine-insulin aspart (NOVOLOG 70/30) injection 20 Units  20 Units Subcutaneous QAC supper Jimmye Norman, PA   20 Units at 05/13/11 1709  . magnesium hydroxide (MILK OF MAGNESIA) suspension 30 mL  30 mL Oral Daily PRN Jimmye Norman, PA      . metFORMIN (GLUCOPHAGE) tablet 500 mg  500 mg Oral BID WC Jimmye Norman, PA   500 mg at 05/14/11 0813  . traZODone (DESYREL) tablet 50 mg  50 mg Oral QHS PRN Jimmye Norman, PA      . DISCONTD: acetaminophen (TYLENOL) tablet 650 mg  650 mg Oral Q6H PRN Jimmye Norman, PA      . DISCONTD: carbamazepine (TEGRETOL) tablet 200 mg  200 mg Oral TID Darrol Jump, MD   200 mg at 05/13/11 2209  . DISCONTD: citalopram (CELEXA) tablet 20 mg  20 mg Oral Daily Encarnacion Slates, NP        Lab Results:  Results for orders placed during the hospital encounter of 05/12/11 (from the past 48 hour(s))  GLUCOSE, CAPILLARY     Status: Abnormal   Collection Time   05/12/11  4:34 PM      Component Value Range Comment   Glucose-Capillary 205 (*)  70 - 99 (mg/dL)    Comment 1 Documented in Chart      Comment 2 Notify RN     GLUCOSE, CAPILLARY     Status: Abnormal   Collection Time   05/12/11  9:42 PM      Component Value Range Comment   Glucose-Capillary 207 (*) 70 - 99 (mg/dL)   GLUCOSE, CAPILLARY     Status: Abnormal   Collection Time   05/13/11  6:35 AM      Component Value Range Comment   Glucose-Capillary 205 (*) 70 - 99 (mg/dL)   GLUCOSE, CAPILLARY     Status: Abnormal   Collection Time   05/13/11 11:17 AM      Component Value Range Comment   Glucose-Capillary 221 (*) 70 - 99 (mg/dL)   GLUCOSE, CAPILLARY     Status: Abnormal   Collection Time   05/13/11  4:56 PM      Component Value Range Comment   Glucose-Capillary 345 (*) 70 - 99 (mg/dL)   HEMOGLOBIN A1C     Status: Abnormal   Collection Time   05/13/11  7:45 PM      Component Value Range Comment   Hemoglobin A1C 13.0 (*) <5.7 (%)     Mean Plasma Glucose 326 (*) <117 (mg/dL)   VITAMIN D 25 HYDROXY     Status: Abnormal   Collection Time   05/13/11  7:45 PM      Component Value Range Comment   Vit D, 25-Hydroxy 15 (*) 30 - 89 (ng/mL)   GLUCOSE, CAPILLARY     Status: Abnormal   Collection Time   05/13/11 10:10 PM      Component Value Range Comment   Glucose-Capillary 148 (*) 70 - 99 (mg/dL)   GLUCOSE, CAPILLARY     Status: Abnormal   Collection Time   05/14/11  6:16 AM      Component Value Range Comment   Glucose-Capillary 218 (*) 70 - 99 (mg/dL)   GLUCOSE, CAPILLARY     Status: Abnormal   Collection Time   05/14/11 12:03 PM      Component Value Range Comment   Glucose-Capillary 142 (*) 70 - 99 (mg/dL)     Physical Findings: AIMS:  , ,  ,  ,    CIWA:    COWS:     Treatment Plan Summary: Daily contact with patient to assess and evaluate symptoms and progress in treatment Medication management  Plan: 1. Shift Tegretol to at meal times so as to not be upsetting to his stomach  2. Restart Celexa at 10mg  for OCD.  3. Re refer him to the mental health center on [place] on [address].  Neyda Durango 05/14/2011, 3:29 PM

## 2011-05-14 NOTE — Progress Notes (Signed)
Patient ID: [name], male   DOB: [birth year], 51 y.o.   MRN: [id number] Patient's self inventory form, stated pill did not work.  Has poor sleep, improving appetite, normal energy level, poor attention span.   Rated depression #5, hopelessness #6.  Has felt agitation in the past 24 hours.   SI off/on, contracts for safety.  Has experienced headaches in past 24 hours.  Worst pain #6.  No ideas how to take better care of herself.  No questions for staff.   Does not have discharge plans.   No problems getting meds after discharge. Patient refused to take his celexa and tegretol this morning, stated he does not need this medication.

## 2011-05-14 NOTE — Progress Notes (Signed)
[facility] Group Notes:  (Counselor/Nursing/MHT/Case Management/Adjunct)  05/14/2011 12:12 PM  Type of Therapy:  Psychoeducational Skills  Participation Level:  Did Not Attend    Summary of Progress/Problems: [name] did not attend psycho-education group on labels.   Jola Baptist 05/14/2011, 12:12 PM

## 2011-05-14 NOTE — Progress Notes (Signed)
Patient seen to follow up on discharge needs.  He did not attend discharge planning group.  Patient reports being better and rates depression and anxiety at two or three.  He denies SI/HI. Patient advised of plans to return to [facility] on discharge.  His follow up is scheduled.  Suicide prevention reviewed during group on 05/13/11.

## 2011-05-15 LAB — GLUCOSE, CAPILLARY
Glucose-Capillary: 185 mg/dL — ABNORMAL HIGH (ref 70–99)
Glucose-Capillary: 158 mg/dL — ABNORMAL HIGH (ref 70–99)
Glucose-Capillary: 276 mg/dL — ABNORMAL HIGH (ref 70–99)
Glucose-Capillary: 197 mg/dL — ABNORMAL HIGH (ref 70–99)

## 2011-05-15 MED ORDER — METFORMIN HCL 500 MG PO TABS
500.0000 mg | ORAL_TABLET | Freq: Two times a day (BID) | ORAL | Status: DC
  Administered 2011-05-15 – 2011-05-16 (×2): 500 mg via ORAL

## 2011-05-15 MED ORDER — CITALOPRAM HYDROBROMIDE 10 MG/5ML PO SOLN
20.0000 mg | Freq: Every day | ORAL | Status: DC
  Administered 2011-05-16: 20 mg via ORAL
  Filled 2011-05-15 (×2): qty 10

## 2011-05-15 MED ORDER — METFORMIN HCL 500 MG PO TABS
1000.0000 mg | ORAL_TABLET | Freq: Two times a day (BID) | ORAL | Status: DC
  Filled 2011-05-15 (×2): qty 2

## 2011-05-15 NOTE — Progress Notes (Signed)
Writer has been informed of pt refusing meds, cbgs, and groups. Pt was informed at the beginning of shift of our plan to check his cbg for his safety and health. Pt was cooperative in having cbg done. Pt did not attend group this evening. Pt reported feeling fine and having a level of depression of 2 out of 10. Pt is also denying any SI at this time. Pt has expressed to this writer his dislikes for meds. With this writer pt has been calm and cooperative this evening. Continued support and availability as needed has been extended to this pt. Pt safety remains with q68min checks.

## 2011-05-15 NOTE — Progress Notes (Signed)
D:  Pt refused wrap up group this evening when asked to attend.  A:  Encourage participation and interaction in programming at hospital.  R:  Pt is safe. Carlye Grippe, Timiko Offutt L, MHT/NS

## 2011-05-15 NOTE — Progress Notes (Signed)
Pt is pleasant and cooperative. Pt had concerns about medications and was able to talk it over with the doctor and makes some changes. Pt attends groups and interacts well with others. Pt rates depression at 3 and hopelessness at a 4. Pt was offered support and encouragement. Pt receptive to treatment and safety maintained on unit.

## 2011-05-15 NOTE — Progress Notes (Signed)
[facility] Group Notes: (Counselor/Nursing/MHT/Case Management/Adjunct) 05/15/2011   @1 :15pm  Type of Therapy:  Group Therapy  Participation Level:  Limited  Participation Quality: Attentive    Affect:  Appropriate  Cognitive:  Appropriate  Insight:  None  Engagement in Group: Limited  Engagement in Therapy:  Limited  Modes of Intervention:  Support and Exploration  Summary of Progress/Problems:  Khamal  was attentive but not engaged in group process    Benard Halsted 05/14/2011      4:04pm

## 2011-05-15 NOTE — Progress Notes (Signed)
[facility] Group Notes:  (Counselor/Nursing/MHT/Case Management/Adjunct)  05/15/2011 12:00 PM  Type of Therapy:  Group Therapy  Participation Level:  Did Not Attend Arrived for beginning of group, but reported that he was experiencing pain and requested to return to his bed. Summary of Progress/Problems:   Melonie Florida 05/15/2011, 12:00 PM

## 2011-05-15 NOTE — Progress Notes (Signed)
[facility] MD Progress Note  05/15/2011 3:45 PM  Subjective: "I don't feel good today. I feel very anxious today. I feel this way because I don't know what I am doing right now. I have some fear in me that I am struggling with. It is about what I am doing and or going. I am taking the medications like I am suppose to. I am not sure if it is working because I feel real depressed today"  Diagnosis:   Axis I: Bipolar disorder, depressed. Axis II: Deferred Axis III:  Past Medical History  Diagnosis Date  . Diabetes mellitus   . Hypertension    Axis IV: No changes Axis V: 57  ADL's:  Intact  Sleep: Fair  Appetite:  Good  Suicidal Ideation:  Plan:  No Intent:  No Means:  No Homicidal Ideation:  Plan:  No Intent:  No Means:  No  AEB (as evidenced by):  Mental Status Examination/Evaluation: Objective:  Appearance: Casual, obese  Eye Contact::  Fair  Speech:  Clear and Coherent  Volume:  Normal  Mood:  Anxious and Depressed  Affect:  Blunt and Flat  Thought Process:  Coherent  Orientation:  Full  Thought Content:  Rumination  Suicidal Thoughts:  No  Homicidal Thoughts:  No  Memory:  Immediate;   Good Recent;   Good Remote;   Good  Judgement:  Fair  Insight:  Fair  Psychomotor Activity:  Normal  Concentration:  Fair  Recall:  Good  Akathisia:  No  Handed:  Right  AIMS (if indicated):     Assets:  Desire for Improvement  Sleep:  Number of Hours: 5.75    Vital Signs:Blood pressure 105/61, pulse 75, temperature 97.4 F (36.3 C), temperature source Oral, resp. rate 16. Current Medications: Current Facility-Administered Medications  Medication Dose Route Frequency Provider Last Rate Last Dose  . alum & mag hydroxide-simeth (MAALOX/MYLANTA) 200-200-20 MG/5ML suspension 30 mL  30 mL Oral Q4H PRN Jimmye Norman, PA-C   30 mL at 05/14/11 1144  . benazepril (LOTENSIN) tablet 10 mg  10 mg Oral Daily Jimmye Norman, PA-C   10 mg at 05/15/11 [id number]  . carbamazepine (TEGRETOL) tablet 200 mg   200 mg Oral TID Darrol Jump, MD   200 mg at 05/15/11 1205  . citalopram (CELEXA) 10 MG/5ML suspension 10 mg  10 mg Oral Daily Darrol Jump, MD   10 mg at 05/15/11 0809  . ibuprofen (ADVIL,MOTRIN) tablet 400 mg  400 mg Oral Q4H PRN Darrol Jump, MD      . insulin aspart protamine-insulin aspart (NOVOLOG 70/30) injection 20 Units  20 Units Subcutaneous Delle Reining, PA-C   20 Units at 05/15/11 0636  . insulin aspart protamine-insulin aspart (NOVOLOG 70/30) injection 20 Units  20 Units Subcutaneous QAC supper Jimmye Norman, PA-C   20 Units at 05/14/11 1720  . magnesium hydroxide (MILK OF MAGNESIA) suspension 30 mL  30 mL Oral Daily PRN Jimmye Norman, PA-C      . metFORMIN (GLUCOPHAGE) tablet 500 mg  500 mg Oral BID WC Jimmye Norman, PA-C   500 mg at 05/15/11 [id number]  . traZODone (DESYREL) tablet 50 mg  50 mg Oral QHS PRN Jimmye Norman, PA-C        Lab Results:  Results for orders placed during the hospital encounter of 05/12/11 (from the past 48 hour(s))  GLUCOSE, CAPILLARY     Status: Abnormal   Collection Time   05/13/11  4:56 PM  Component Value Range Comment   Glucose-Capillary 345 (*) 70 - 99 (mg/dL)   HEMOGLOBIN A1C     Status: Abnormal   Collection Time   05/13/11  7:45 PM      Component Value Range Comment   Hemoglobin A1C 13.0 (*) <5.7 (%)    Mean Plasma Glucose 326 (*) <117 (mg/dL)   VITAMIN D 25 HYDROXY     Status: Abnormal   Collection Time   05/13/11  7:45 PM      Component Value Range Comment   Vit D, 25-Hydroxy 15 (*) 30 - 89 (ng/mL)   GLUCOSE, CAPILLARY     Status: Abnormal   Collection Time   05/13/11 10:10 PM      Component Value Range Comment   Glucose-Capillary 148 (*) 70 - 99 (mg/dL)   GLUCOSE, CAPILLARY     Status: Abnormal   Collection Time   05/14/11  6:16 AM      Component Value Range Comment   Glucose-Capillary 218 (*) 70 - 99 (mg/dL)   GLUCOSE, CAPILLARY     Status: Abnormal   Collection Time   05/14/11 12:03 PM      Component Value Range Comment    Glucose-Capillary 142 (*) 70 - 99 (mg/dL)   GLUCOSE, CAPILLARY     Status: Abnormal   Collection Time   05/14/11  4:53 PM      Component Value Range Comment   Glucose-Capillary 204 (*) 70 - 99 (mg/dL)   GLUCOSE, CAPILLARY     Status: Abnormal   Collection Time   05/14/11  9:04 PM      Component Value Range Comment   Glucose-Capillary 229 (*) 70 - 99 (mg/dL)   GLUCOSE, CAPILLARY     Status: Abnormal   Collection Time   05/15/11  6:33 AM      Component Value Range Comment   Glucose-Capillary 185 (*) 70 - 99 (mg/dL)   GLUCOSE, CAPILLARY     Status: Abnormal   Collection Time   05/15/11 11:35 AM      Component Value Range Comment   Glucose-Capillary 158 (*) 70 - 99 (mg/dL)    Comment 1 Notify RN       Physical Findings: AIMS:  , ,  ,  ,    CIWA:    COWS:     Treatment Plan Summary: Daily contact with patient to assess and evaluate symptoms and progress in treatment Medication management  Plan: Will increase Citalopram to 20 mg daily.           Increase metformin to 1000 mg bid.          Continue current treatment plan.             Lindell Spar I 05/15/2011, 3:45 PM

## 2011-05-15 NOTE — Progress Notes (Signed)
[facility] Group Notes: (Counselor/Nursing/MHT/Case Management/Adjunct) 05/15/2011   @1 :15pm  Type of Therapy:  Group Therapy  Participation Level:  None  Participation Quality: Drowsy, Inattentive    Affect:  Appropriate  Cognitive:  Appropriate  Insight:  None  Engagement in Group: None  Engagement in Therapy:  None  Modes of Intervention:  Support and Exploration  Summary of Progress/Problems: [name] was asleep as group began and counselor was unable to wake him. After waking halfway through group he was attentive but not engaged in group process   Benard Halsted 05/15/2011 3:38 PM

## 2011-05-15 NOTE — Progress Notes (Signed)
05/15/2011         Time: 1415      Group Topic/Focus: The focus of this group is on enhancing patients' problem solving skills, which involves identifying the problem, brainstorming solutions and choosing and trying a solution.   Participation Level: Active  Participation Quality: Appropriate and Attentive  Affect: Appropriate  Cognitive: Oriented   Additional Comments: None.   [name] 05/15/2011 3:58 PM

## 2011-05-15 NOTE — Progress Notes (Signed)
Pt attended discharge planning group and actively participated.  Pt presents with flat affect and depressed mood.  Pt ranks depression and anxiety at a 4-5 today.  Pt denies SI, stating not at this moment.  Pt states that he is in pain today and has a headache.  Pt states that he doesn't feel ready to d/c today.  Pt plans to return to [facility] but states he doesn't have a way to get back there.  Pt has follow up in place.  No further needs voiced by pt at this time.    Regan Lemming, [place] 05/15/2011  10:51 AM

## 2011-05-16 LAB — GLUCOSE, CAPILLARY
Glucose-Capillary: 201 mg/dL — ABNORMAL HIGH (ref 70–99)
Glucose-Capillary: 186 mg/dL — ABNORMAL HIGH (ref 70–99)
Glucose-Capillary: 255 mg/dL — ABNORMAL HIGH (ref 70–99)
Glucose-Capillary: 187 mg/dL — ABNORMAL HIGH (ref 70–99)

## 2011-05-16 LAB — HEMOGLOBIN A1C
Hgb A1c MFr Bld: 13.5 % — ABNORMAL HIGH (ref <5.7)
Mean Plasma Glucose: 341 mg/dL — ABNORMAL HIGH (ref <117)

## 2011-05-16 MED ORDER — CITALOPRAM HYDROBROMIDE 20 MG PO TABS
20.0000 mg | ORAL_TABLET | Freq: Every day | ORAL | Status: DC
  Administered 2011-05-17 – 2011-05-20 (×4): 20 mg via ORAL
  Filled 2011-05-16 (×4): qty 1
  Filled 2011-05-16: qty 14
  Filled 2011-05-16 (×2): qty 1

## 2011-05-16 MED ORDER — CARBAMAZEPINE 200 MG PO TABS
200.0000 mg | ORAL_TABLET | Freq: Four times a day (QID) | ORAL | Status: DC
  Administered 2011-05-16 – 2011-05-18 (×7): 200 mg via ORAL
  Filled 2011-05-16 (×15): qty 1

## 2011-05-16 MED ORDER — INSULIN ASPART 100 UNIT/ML ~~LOC~~ SOLN
0.0000 [IU] | Freq: Every day | SUBCUTANEOUS | Status: DC
  Administered 2011-05-19: 3 [IU] via SUBCUTANEOUS

## 2011-05-16 MED ORDER — METFORMIN HCL 500 MG PO TABS
1000.0000 mg | ORAL_TABLET | Freq: Two times a day (BID) | ORAL | Status: DC
  Administered 2011-05-16 – 2011-05-20 (×8): 1000 mg via ORAL
  Filled 2011-05-16 (×3): qty 2
  Filled 2011-05-16: qty 28
  Filled 2011-05-16 (×5): qty 2
  Filled 2011-05-16: qty 1
  Filled 2011-05-16: qty 2
  Filled 2011-05-16: qty 28
  Filled 2011-05-16 (×2): qty 2

## 2011-05-16 MED ORDER — INSULIN ASPART 100 UNIT/ML ~~LOC~~ SOLN
0.0000 [IU] | Freq: Three times a day (TID) | SUBCUTANEOUS | Status: DC
  Administered 2011-05-16 – 2011-05-17 (×3): 8 [IU] via SUBCUTANEOUS

## 2011-05-16 MED FILL — Insulin Aspart Prot & Aspart (Human) Inj 100 Unit/ML (70-30): SUBCUTANEOUS | Qty: 0.2 | Status: AC

## 2011-05-16 NOTE — Progress Notes (Signed)
Patient ID: [name], male   DOB: 17-Feb-1961, 51 y.o.   MRN: [id number] Pt. Watching basketball game in dayroom, interacts with other clients. Pt. Reports depression at "3" out of ten. Pt. Attends karaoke. Pt. Reports no problems with meds, but wants to stay on "regular doctor's" order of taking insulin twice a day. Writer referred to physician, as no sliding scale insulin was given tonight. Pt. Denies SHI. Staff will continue to monitor q21min for safety.

## 2011-05-16 NOTE — [facility] Suicide Risk Assessment (Signed)
Suicide Risk Assessment  Discharge Assessment     Demographic factors:  Male;Divorced or widowed;Living alone;Unemployed    Current Mental Status Per Nursing Assessment::   On Admission:  Suicidal ideation indicated by patient At Discharge:     Loss Factors: Decrease in vocational status;Decline in physical health;Financial problems / change in socioeconomic status  Historical Factors: Prior suicide attempts  Risk Reduction Factors:      Continued Clinical Symptoms:  Depression:   Anhedonia Hopelessness Chronic Pain Previous Psychiatric Diagnoses and Treatments Medical Diagnoses and Treatments/Surgeries  Discharge Diagnoses:   AXIS I:  Bipolar, Depressed and Obsessive Compulsive Disorder AXIS II:  Deferred AXIS III:   Past Medical History  Diagnosis Date  . Diabetes mellitus   . Hypertension    AXIS IV:  housing problems and other psychosocial or environmental problems AXIS V:  41-50 serious symptoms  Cognitive Features That Contribute To Risk:  Thought constriction (tunnel vision)    Suicide Risk:  Minimal: No identifiable suicidal ideation.  Patients presenting with no risk factors but with morbid ruminations; may be classified as minimal risk based on the severity of the depressive symptoms  Current Mental Status Per Physician:  Pt was seen in treatment team where he described how he has changed from coming into the hospital.  He has learned several things and he feels much more positive about himself and his life.  All of his questions were answered and he was satisfied.  ADL's:  Intact  Sleep: Good  Appetite:  Good  Suicidal Ideation:  dDenies adamantly any suicidal thoughts. Homicidal Ideation:  Denies adamantly any homicidal thoughts.  Mental Status Examination/Evaluation: Objective:  Appearance: Casual  Eye Contact::  Good  Speech:  Clear and Coherent  Volume:  Normal  Mood:  Euthymic  Affect:  Congruent  Thought Process:  Coherent    Orientation:  Full  Thought Content:  WDL  Suicidal Thoughts:  No  Homicidal Thoughts:  No  Memory:  Immediate;   Good  Judgement:  Good  Insight:  Good  Psychomotor Activity:  Normal  Concentration:  Good  Recall:  Good  Akathisia:  No  AIMS (if indicated):     Assets:  Communication Skills Desire for Improvement Resilience Social Support  Sleep: Number of Hours: 6    Vital Signs: Blood pressure 143/97, pulse 80, temperature 97.5 F (36.4 C), temperature source Oral, resp. rate 17.  Labs Results for orders placed during the hospital encounter of 05/12/11 (from the past 72 hour(s))  GLUCOSE, CAPILLARY     Status: Abnormal   Collection Time   05/17/11  4:46 PM      Component Value Range Comment   Glucose-Capillary 276 (*) 70 - 99 (mg/dL)   GLUCOSE, CAPILLARY     Status: Abnormal   Collection Time   05/17/11  9:34 PM      Component Value Range Comment   Glucose-Capillary 156 (*) 70 - 99 (mg/dL)   GLUCOSE, CAPILLARY     Status: Abnormal   Collection Time   05/18/11  6:02 AM      Component Value Range Comment   Glucose-Capillary 186 (*) 70 - 99 (mg/dL)    Comment 1 Notify RN     GLUCOSE, CAPILLARY     Status: Abnormal   Collection Time   05/18/11 11:52 AM      Component Value Range Comment   Glucose-Capillary 229 (*) 70 - 99 (mg/dL)    Comment 1 Notify RN     GLUCOSE, CAPILLARY  Status: Abnormal   Collection Time   05/18/11  5:01 PM      Component Value Range Comment   Glucose-Capillary 231 (*) 70 - 99 (mg/dL)    Comment 1 Documented in Chart      Comment 2 Notify RN     GLUCOSE, CAPILLARY     Status: Abnormal   Collection Time   05/18/11  8:58 PM      Component Value Range Comment   Glucose-Capillary 222 (*) 70 - 99 (mg/dL)    Comment 1 Notify RN     GLUCOSE, CAPILLARY     Status: Abnormal   Collection Time   05/19/11  6:33 AM      Component Value Range Comment   Glucose-Capillary 174 (*) 70 - 99 (mg/dL)   GLUCOSE, CAPILLARY     Status: Abnormal   Collection  Time   05/19/11 12:02 PM      Component Value Range Comment   Glucose-Capillary 157 (*) 70 - 99 (mg/dL)    Comment 1 Notify RN     GLUCOSE, CAPILLARY     Status: Abnormal   Collection Time   05/19/11  5:16 PM      Component Value Range Comment   Glucose-Capillary 249 (*) 70 - 99 (mg/dL)    Comment 1 Notify RN     GLUCOSE, CAPILLARY     Status: Abnormal   Collection Time   05/19/11  9:46 PM      Component Value Range Comment   Glucose-Capillary 268 (*) 70 - 99 (mg/dL)   GLUCOSE, CAPILLARY     Status: Abnormal   Collection Time   05/20/11  6:33 AM      Component Value Range Comment   Glucose-Capillary 155 (*) 70 - 99 (mg/dL)   GLUCOSE, CAPILLARY     Status: Abnormal   Collection Time   05/20/11 11:27 AM      Component Value Range Comment   Glucose-Capillary 193 (*) 70 - 99 (mg/dL)     RISK REDUCTION FACTORS: What pt has learned from hospital stay is "There are things I can do differently."  As an example was this AM he practiced responding instead of reacting and that felt different and much better.  Risk of self harm is elevated by his diagnosis and his two prior attempts, but he has realized that he has himself to live for;  Risk of harm to others is minimal in that he has not been involved in fights or had any legal charges filed on him.  PLAN: Discharge home Continue Medication List  As of 05/20/2011  2:04 PM   TAKE these medications         benazepril 10 MG tablet   Commonly known as: LOTENSIN   Take 1 tablet (10 mg total) by mouth daily. For control of high blood pressure      citalopram 20 MG tablet   Commonly known as: CELEXA   Take 1 tablet (20 mg total) by mouth daily. For depression.      insulin NPH-insulin regular (70-30) 100 UNIT/ML injection   Commonly known as: NOVOLIN 70/30   Inject 20 Units into the skin 2 (two) times daily with a meal. For control of blood sugar      metFORMIN 500 MG tablet   Commonly known as: GLUCOPHAGE   Take 2 tablets (1,000 mg  total) by mouth 2 (two) times daily with a meal. For control of blood sugar      traZODone 50  MG tablet   Commonly known as: DESYREL   Take 1 tablet (50 mg total) by mouth at bedtime as needed for sleep (may repeat x 1 if need for sleep).           Follow-up recommendations:  Activities: Resume typical activities Diet: Resume typical diet Other: Follow up with outpatient provider and report any side effects to out patient prescriber.  Randy Castrejon 05/16/2011, 8:27 PM

## 2011-05-16 NOTE — Progress Notes (Signed)
[facility] Adult Inpatient Family/Significant Other Suicide Prevention Education  Suicide Prevention Education:  Patient Refusal for Family/Significant Other Suicide Prevention Education: The patient [name] has refused to provide written consent for family/significant other to be provided Family/Significant Other Suicide Prevention Education during admission and/or prior to discharge.  Physician notified.  [name] was given suicide prevention pamphlet and suicide risks, warning signs and crisis numbers were discussed with him. He verbalized understanding and did not have any further questions on the matter.   Benard Halsted 05/16/2011, 12:51 PM

## 2011-05-16 NOTE — Tx Team (Signed)
Interdisciplinary Treatment Plan Update (Adult)  Date:  05/16/2011  Time Reviewed:  11:17 AM   Progress in Treatment: Attending groups: Yes Participating in groups:  Yes Taking medication as prescribed: Yes Tolerating medication:  Yes Family/Significant other contact made:  No Patient understands diagnosis:  Yes Discussing patient identified problems/goals with staff:  Yes Medical problems stabilized or resolved:  Yes Denies suicidal/homicidal ideation: Yes Issues/concerns per patient self-inventory:  None identified Other: N/A  New problem(s) identified: None Identified  Reason for Continuation of Hospitalization: Anxiety Depression Medication stabilization  Interventions implemented related to continuation of hospitalization: mood stabilization, medication monitoring and adjustment, group therapy and psycho education, safety checks q 15 mins  Additional comments: N/A  Estimated length of stay: 1-2 days  Discharge Plan: Pt will follow up at [facility] in [place], [place] for medication management and therapy.    New goal(s): N/A  Review of initial/current patient goals per problem list:    1.  Goal(s): Reduce depressive symptoms  Met:  No  Target date: by discharge  As evidenced by: Reducing depression from a 10 to a 3 as reported by pt. Pt ranks at a 5 today.   2.  Goal (s): Reduce/Eliminate suicidal ideation  Met:  Yes  Target date: by discharge  As evidenced by: pt denies SI  3.  Goal(s): Reduce anxiety symptoms  Met:  No  Target date: by discharge  As evidenced by: Reduce anxiety from a 10 to a 3 as reported by pt. Pt ranks at a 5 today.     Attendees: Patient:  [name] 05/16/2011 11:19 AM   Family:     Physician:  Rudean Curt, MD  05/16/2011  11:17 AM   Nursing:   Beverly Sessions, RN 05/16/2011 11:19 AM   Case Manager:  Regan Lemming, [place] 05/16/2011  11:17 AM   Counselor:  Lillie Fragmin, LCSW 05/16/2011  11:17 AM   Other:  Joette Catching,  LCSW 05/16/2011  11:17 AM   Other:  Agustina Caroli, NP 05/16/2011  11:17 AM   Other:  Grayland Ormond, RN 05/16/2011 11:19 AM   Other:      Scribe for Treatment Team:   Ane Payment, 05/16/2011 , 11:17 AM

## 2011-05-16 NOTE — Progress Notes (Signed)
Pt rated depression at a 5 due to having a bad dream and rates hopelessness at a 3. Pt does attend groups and actively participates. Pt after being told he would be D/C came and talked to staff. Pt stated that in his dream he dreamed that he left the hospital and cut his neck with the knife we took away from him and put in his locker. Pt did state he did not want to hurt himself. Pt was offered support and encouragement. Pt receptive to treatment and safety maintained.

## 2011-05-16 NOTE — Progress Notes (Signed)
[place] Group Notes: (Counselor/Nursing/MHT/Case Management/Adjunct) 05/16/2011   @  11:00am   Type of Therapy:  Group Therapy  Participation Level:  Limited  Participation Quality:  Attentive  Affect:  Blunted  Cognitive:  Appropriate  Insight:  Limited  Engagement in Group: Limited  Engagement in Therapy:  Limited  Modes of Intervention:  Support and Exploration  Summary of Progress/Problems: Mattie was quiet for much of group, but did give another group member feedback about continuing to hang on to a relationship so no one else would be in it as "selfish." He also identified that he has no problems setting boundaries or making sure that his needs are met.  Benard Halsted 05/16/2011 12:48 PM

## 2011-05-16 NOTE — Progress Notes (Signed)
Pt attended discharge planning group and actively participated.  Pt presents with calm mood and affect.  Pt ranks depression and anxiety at a 5 today.  Pt denies SI.  Pt states that he doesn't know how he will get home in [place].  Pt has follow up in place.  No further needs voiced by pt at this time.  Safety planning and suicide prevention discussed.     Regan Lemming, [place] 05/16/2011  10:33 AM

## 2011-05-16 NOTE — Progress Notes (Addendum)
[facility] MD Progress Note  05/16/2011 12:53 PM  Diagnosis:  Axis I: Bipolar, Depressed and Obsessive Compulsive Disorder  ADL's:  Intact  Sleep: Good, had a pretty scary dream that he talked about in group.  The part that was most frightening is that is was a premonition that he might use his knife to cut his neck with the knife in his possessions.  He had had a premonitory dream about him holding a gun to his head a while before he actually ended up doing that years ago.  As a result, will increase the Tegretol to 4 doses a day as premonitory thoughts may be modified with an adequate level of Tegretol.  Appetite:  Fair  Suicidal Ideation:  Denies and was actually disturbed by the bad dream he had had last night about him harming himself.  He is wanting to do things to help himself get and be better. Homicidal Ideation:  Denies adamantly any homicidal thoughts.  Mental Status Examination/Evaluation: Objective:  Appearance: Casual  Eye Contact::  Good  Speech:  Clear and Coherent  Volume:  Normal  Mood:  5 /10 on a scale of 1 is the best and 10 is the worst  Anxiety 5/10 on the same scale  Affect:  Congruent  Thought Process:  Coherent  Orientation:  Full  Thought Content:  WDL  Suicidal Thoughts:  No  Homicidal Thoughts:  No  Memory:  Immediate;   Fair  Judgement:  Good  Insight:  Good  Psychomotor Activity:  Normal  Concentration:  Fair  Recall:  Fair  Akathisia:  No  Handed:  Right  AIMS (if indicated):     Assets:  Communication Skills Desire for Improvement Resilience  Sleep:  Number of Hours: 6.25    Vital Signs:Blood pressure 150/97, pulse 80, temperature 96.7 F (35.9 C), temperature source Oral, resp. rate 20. Current Medications: Current Facility-Administered Medications  Medication Dose Route Frequency Provider Last Rate Last Dose  . alum & mag hydroxide-simeth (MAALOX/MYLANTA) 200-200-20 MG/5ML suspension 30 mL  30 mL Oral Q4H PRN Jimmye Norman, PA-C   30 mL at  05/14/11 1144  . benazepril (LOTENSIN) tablet 10 mg  10 mg Oral Daily Jimmye Norman, PA-C   10 mg at 05/16/11 [id number]  . carbamazepine (TEGRETOL) tablet 200 mg  200 mg Oral TID Darrol Jump, MD   200 mg at 05/16/11 1159  . citalopram (CELEXA) tablet 20 mg  20 mg Oral Daily Darrol Jump, MD      . ibuprofen (ADVIL,MOTRIN) tablet 400 mg  400 mg Oral Q4H PRN Darrol Jump, MD      . insulin aspart protamine-insulin aspart (NOVOLOG 70/30) injection 20 Units  20 Units Subcutaneous Delle Reining, PA-C   20 Units at 05/16/11 [id number]  . insulin aspart protamine-insulin aspart (NOVOLOG 70/30) injection 20 Units  20 Units Subcutaneous QAC supper Jimmye Norman, PA-C   20 Units at 05/15/11 1713  . magnesium hydroxide (MILK OF MAGNESIA) suspension 30 mL  30 mL Oral Daily PRN Jimmye Norman, PA-C      . metFORMIN (GLUCOPHAGE) tablet 500 mg  500 mg Oral BID WC Encarnacion Slates, NP   500 mg at 05/16/11 0826  . traZODone (DESYREL) tablet 50 mg  50 mg Oral QHS PRN Jimmye Norman, PA-C      . DISCONTD: citalopram (CELEXA) 10 MG/5ML suspension 10 mg  10 mg Oral Daily Darrol Jump, MD   10 mg at 05/15/11 0809  . DISCONTD: citalopram (  CELEXA) 10 MG/5ML suspension 20 mg  20 mg Oral Daily Encarnacion Slates, NP   20 mg at 05/16/11 0826  . DISCONTD: metFORMIN (GLUCOPHAGE) tablet 1,000 mg  1,000 mg Oral BID WC Encarnacion Slates, NP      . DISCONTD: metFORMIN (GLUCOPHAGE) tablet 500 mg  500 mg Oral BID WC Jimmye Norman, PA-C   500 mg at 05/15/11 [id number]    Lab Results:  Results for orders placed during the hospital encounter of 05/12/11 (from the past 48 hour(s))  GLUCOSE, CAPILLARY     Status: Abnormal   Collection Time   05/14/11  4:53 PM      Component Value Range Comment   Glucose-Capillary 204 (*) 70 - 99 (mg/dL)   GLUCOSE, CAPILLARY     Status: Abnormal   Collection Time   05/14/11  9:04 PM      Component Value Range Comment   Glucose-Capillary 229 (*) 70 - 99 (mg/dL)   GLUCOSE, CAPILLARY     Status: Abnormal   Collection Time   05/15/11   6:33 AM      Component Value Range Comment   Glucose-Capillary 185 (*) 70 - 99 (mg/dL)   GLUCOSE, CAPILLARY     Status: Abnormal   Collection Time   05/15/11 11:35 AM      Component Value Range Comment   Glucose-Capillary 158 (*) 70 - 99 (mg/dL)    Comment 1 Notify RN     GLUCOSE, CAPILLARY     Status: Abnormal   Collection Time   05/15/11  4:52 PM      Component Value Range Comment   Glucose-Capillary 276 (*) 70 - 99 (mg/dL)   HEMOGLOBIN A1C     Status: Abnormal   Collection Time   05/15/11  8:01 PM      Component Value Range Comment   Hemoglobin A1C 13.5 (*) <5.7 (%)    Mean Plasma Glucose 341 (*) <117 (mg/dL)   GLUCOSE, CAPILLARY     Status: Abnormal   Collection Time   05/15/11  9:38 PM      Component Value Range Comment   Glucose-Capillary 197 (*) 70 - 99 (mg/dL)   GLUCOSE, CAPILLARY     Status: Abnormal   Collection Time   05/16/11  6:01 AM      Component Value Range Comment   Glucose-Capillary 201 (*) 70 - 99 (mg/dL)    Comment 1 Notify RN       Physical Findings: AIMS:  , ,  ,  ,    CIWA:    COWS:     Treatment Plan Summary: Daily contact with patient to assess and evaluate symptoms and progress in treatment Medication management  Discussion/Plan: Pt seen both in treatment team and in office.  Discussed at length the dream and the theory of using Tegretol to help temporal lobe functioning.  1. Add Tegretol dose at HS to increase his overall dose and hopefully suppress premonitory thoughts.  Get blood level in AM in anticipation of discharge soon.  2. Shift Celexa for OCD to tablet form.  The liquid form ordered in error, tastes nasty.  3. Re refer him to the mental health center on [address] on [address].  [name] 05/16/2011, 12:53 PM

## 2011-05-17 LAB — GLUCOSE, CAPILLARY
Glucose-Capillary: 184 mg/dL — ABNORMAL HIGH (ref 70–99)
Glucose-Capillary: 268 mg/dL — ABNORMAL HIGH (ref 70–99)
Glucose-Capillary: 276 mg/dL — ABNORMAL HIGH (ref 70–99)

## 2011-05-17 LAB — CARBAMAZEPINE LEVEL, TOTAL: Carbamazepine Lvl: 8.3 ug/mL (ref 4.0–12.0)

## 2011-05-17 NOTE — Progress Notes (Signed)
[place] Group Notes: (Counselor/Nursing/MHT/Case Management/Adjunct) 05/17/2011   @1 :15pm  Type of Therapy:  Group Therapy  Participation Level:  Active  Participation Quality:  Attentive, Sharing  Affect:  Appropriate, Anxious congruent to content of discussion  Cognitive:  Appropriate  Insight:  Limited  Engagement in Group: Good   Engagement in Therapy:  Good  Modes of Intervention:  Support and Exploration  Summary of Progress/Problems:  [name] was very engaged in discussion of wise mind. He initially shared that he believes he functions almost entirely in the reason mind, because he does not empathize much with others and "if it doesn't concern me or my life, I am not bothered by it," even if the situation is one that would "normally" cause sadness or anger in others. In discussing emotion mind, [name] identified that he often is carried away by emotions if he relates to a situation on TV, such as if he watches a football movie he becomes tearful thinking that he should have done more with his life (but never in real life situations, just on TV). He was able to process how he vacilates between extremes, rather than having a balance of reason and emotion such as wise mind represents.   [name] also shared that he had a dream last night of walking out of [facility] and slitting his throat with the knife that is currently in his possessions locker. This was particularly disturbing to him because in the past he had dreams about jumping off a roof and putting a good to his head, and he later did each of those things. Counselor and group members pointed out to [name] that in each of these situations he chose to follow through with the things he dreamed, and that nothing made him do this. He did not seem very receptive to this, stating he believes the dream is a premonition.   [name] 05/17/2011 8:16 AM

## 2011-05-17 NOTE — Progress Notes (Signed)
[place] Group Notes:  (Counselor/Nursing/MHT/Case Management/Adjunct) 1:15PM    Type of Therapy:  Group Therapy  Participation Level:  Did Not Attend   Benard Halsted 05/17/2011  2:45 PM

## 2011-05-17 NOTE — Progress Notes (Signed)
Patient ID: [name], male   DOB: 04-May-1960, 51 y.o.   MRN: [id number] Pt. Reports some dizziness today related to meds, but feels better this afternoon. Pt. Denies SHI. Pt. In dayroom watching TV. He does attend group. Staff will monitor q38min for safety. Pt. Remains safe on the unit.

## 2011-05-17 NOTE — Progress Notes (Addendum)
[facility] MD Progress Note  05/17/2011 5:28 PM  Diagnosis:  Axis I: Bipolar, Depressed and Obsessive Compulsive Disorder  ADL's:  Intact  Sleep: Good, had the same scary dream that he talked about in group.  It may relate to his possible Temporal Lobe abnormality, or his OCD.  Will hold the Tegretol dose the same and monitor.  Appetite:  Fair  Suicidal Ideation:  Denies and was actually disturbed by the bad dream he had had last night about him harming himself.  He is wanting to do things to help himself get and be better. Homicidal Ideation:  Denies adamantly any homicidal thoughts.  Mental Status Examination/Evaluation: Objective:  Appearance: Casual  Eye Contact::  Good  Speech:  Clear and Coherent  Volume:  Normal  Mood:  5 hh/10 on a scale of 1 is the best and 10 is the worst  Anxiety 5hh/10 on the same scale  Affect:  Congruent  Thought Process:  Coherent  Orientation:  Full  Thought Content:  WDL  Suicidal Thoughts:  No  Homicidal Thoughts:  No  Memory:  Immediate;   Fair  Judgement:  Good  Insight:  Good  Psychomotor Activity:  Normal  Concentration:  Fair  Recall:  Fair  Akathisia:  No  Handed:  Right  AIMS (if indicated):     Assets:  Communication Skills Desire for Improvement Resilience  Sleep:  Number of Hours: 6    Vital Signs:Blood pressure 123/83, pulse 85, temperature 97.2 F (36.2 C), temperature source Oral, resp. rate 20. Current Medications: Current Facility-Administered Medications  Medication Dose Route Frequency Provider Last Rate Last Dose  . alum & mag hydroxide-simeth (MAALOX/MYLANTA) 200-200-20 MG/5ML suspension 30 mL  30 mL Oral Q4H PRN Jimmye Norman, PA-C   30 mL at 05/14/11 1144  . benazepril (LOTENSIN) tablet 10 mg  10 mg Oral Daily Jimmye Norman, PA-C   10 mg at 05/17/11 [id number]  . carbamazepine (TEGRETOL) tablet 200 mg  200 mg Oral QID Darrol Jump, MD   200 mg at 05/17/11 1709  . citalopram (CELEXA) tablet 20 mg  20 mg Oral Daily Darrol Jump,  MD   20 mg at 05/17/11 [phone]  . ibuprofen (ADVIL,MOTRIN) tablet 400 mg  400 mg Oral Q4H PRN Darrol Jump, MD      . insulin aspart (novoLOG) injection 0-15 Units  0-15 Units Subcutaneous TID WC Encarnacion Slates, NP   8 Units at 05/17/11 1722  . insulin aspart (novoLOG) injection 0-5 Units  0-5 Units Subcutaneous QHS Encarnacion Slates, NP      . insulin aspart protamine-insulin aspart (NOVOLOG 70/30) injection 20 Units  20 Units Subcutaneous Delle Reining, PA-C   20 Units at 05/17/11 [phone]  . insulin aspart protamine-insulin aspart (NOVOLOG 70/30) injection 20 Units  20 Units Subcutaneous QAC supper Jimmye Norman, PA-C   20 Units at 05/17/11 1713  . magnesium hydroxide (MILK OF MAGNESIA) suspension 30 mL  30 mL Oral Daily PRN Jimmye Norman, PA-C      . metFORMIN (GLUCOPHAGE) tablet 1,000 mg  1,000 mg Oral BID WC Encarnacion Slates, NP   1,000 mg at 05/17/11 1708  . traZODone (DESYREL) tablet 50 mg  50 mg Oral QHS PRN Jimmye Norman, PA-C   50 mg at 05/16/11 2202  . DISCONTD: carbamazepine (TEGRETOL) tablet 200 mg  200 mg Oral TID Darrol Jump, MD   200 mg at 05/16/11 1721    Lab Results:  Results for orders placed during  the hospital encounter of 05/12/11 (from the past 48 hour(s))  HEMOGLOBIN A1C     Status: Abnormal   Collection Time   05/15/11  8:01 PM      Component Value Range Comment   Hemoglobin A1C 13.5 (*) <5.7 (%)    Mean Plasma Glucose 341 (*) <117 (mg/dL)   GLUCOSE, CAPILLARY     Status: Abnormal   Collection Time   05/15/11  9:38 PM      Component Value Range Comment   Glucose-Capillary 197 (*) 70 - 99 (mg/dL)   GLUCOSE, CAPILLARY     Status: Abnormal   Collection Time   05/16/11  6:01 AM      Component Value Range Comment   Glucose-Capillary 201 (*) 70 - 99 (mg/dL)    Comment 1 Notify RN     GLUCOSE, CAPILLARY     Status: Abnormal   Collection Time   05/16/11 11:52 AM      Component Value Range Comment   Glucose-Capillary 186 (*) 70 - 99 (mg/dL)   GLUCOSE, CAPILLARY     Status: Abnormal    Collection Time   05/16/11  5:12 PM      Component Value Range Comment   Glucose-Capillary 255 (*) 70 - 99 (mg/dL)   GLUCOSE, CAPILLARY     Status: Abnormal   Collection Time   05/16/11  9:34 PM      Component Value Range Comment   Glucose-Capillary 187 (*) 70 - 99 (mg/dL)   CARBAMAZEPINE LEVEL, TOTAL     Status: Normal   Collection Time   05/17/11  6:20 AM      Component Value Range Comment   Carbamazepine Lvl 8.3  4.0 - 12.0 (ug/mL)   GLUCOSE, CAPILLARY     Status: Abnormal   Collection Time   05/17/11  6:20 AM      Component Value Range Comment   Glucose-Capillary 184 (*) 70 - 99 (mg/dL)    Comment 1 Notify RN      Comment 2 Documented in Chart     GLUCOSE, CAPILLARY     Status: Abnormal   Collection Time   05/17/11 11:35 AM      Component Value Range Comment   Glucose-Capillary 268 (*) 70 - 99 (mg/dL)   GLUCOSE, CAPILLARY     Status: Abnormal   Collection Time   05/17/11  4:46 PM      Component Value Range Comment   Glucose-Capillary 276 (*) 70 - 99 (mg/dL)     Physical Findings: AIMS:  , ,  ,  ,    CIWA:    COWS:     Treatment Plan Summary: Daily contact with patient to assess and evaluate symptoms and progress in treatment Medication management  Discussion/Plan: Pt notes no anger at all. This is a marked change for him.  He is content with that.  He did note some dizziness this AM, thought related to his Tegretol.  He had a Teg level ordered for this AM.  He was seen in the consult room earlier, but the Teg level was known at noon.  It was 8.3 in the middle of the therapeutic range.  Originally I had told him that I would lower the dose if he is actually toxic on the Tegretol, but he is not at all toxic, so will keep the dose at QID.  He had had the same dream again.  He was challenged by other patients in the group that he had chosen to  duplicate what he had had in dreams before and then later carried out the acts.  He does have obsessive compulsive thought disorder and so  he might have somewhat less choice in not doing what his brain latches onto as a course of action.  He is under the care of mental health care professionals and he will need to develop some personal strategies to stop the potentially  harmful thoughts and not succumbe to them.  He was so frustrated with not remembering that this is the [facility] that he wrote the name on a stickie and posted it on his wall to remind him of that.   Odel Schmid 05/17/2011, 5:28 PM

## 2011-05-17 NOTE — Progress Notes (Addendum)
[place] Group Notes:  (Counselor/Nursing/MHT/Case Management/Adjunct)  05/17/2011 12:01 PM  Type of Therapy:  Group Therapy  Participation Level:  Did not attend    [name] 05/17/2011, 12:01 PM

## 2011-05-17 NOTE — Progress Notes (Signed)
05/17/2011 [name] is seen OOB UAL on the 500 hall tolerated well...he is cooperative and pleasant..joking with the staff as well as his peers. HE attends his groups. He is visibly sad and depressed and has a flat affect. A Initially, he refused all sliding scale offered to him, but allwoed this nurse to process with him and explain sliding scale and agreed to get it ( 8 units novolog ss at 1230 and 8 units novolog ss at 1715, per MD order). R Safety is maintained and POC icnludes continuing to encourage pt to share his feelings.Marland KitchenMarland KitchenPatient is asked to monitor BP at home or work, several times per month and return with written values at next office visit. cbg's and response to meds and foster therapeutic relationship already established PD RN Bc

## 2011-05-17 NOTE — Progress Notes (Signed)
Pt attended discharge planning group and actively participated.  Pt presents with flat affect and depressed mood.  Pt ranks depression and anxiety at a 7 today.  Pt reports on and off SI today.  Pt states that he is dizzy and feels out of it today.  Pt states that he believes this is a side effect of the medication.  Pt will follow up at [facility] in [facility] upon d/c.  Pt will be able to d/c on Monday when he has the funds to buy a bus ticket back to [place].  No further needs voiced by pt at this time.   Regan Lemming, LCSWA 05/17/2011  10:20 AM

## 2011-05-18 LAB — GLUCOSE, CAPILLARY
Glucose-Capillary: 156 mg/dL — ABNORMAL HIGH (ref 70–99)
Glucose-Capillary: 186 mg/dL — ABNORMAL HIGH (ref 70–99)
Glucose-Capillary: 229 mg/dL — ABNORMAL HIGH (ref 70–99)
Glucose-Capillary: 231 mg/dL — ABNORMAL HIGH (ref 70–99)
Glucose-Capillary: 222 mg/dL — ABNORMAL HIGH (ref 70–99)

## 2011-05-18 NOTE — Progress Notes (Signed)
[facility] Group Notes:  (Counselor/Nursing/MHT/Case Management/Adjunct)  05/18/2011 10:36 AM  Type of Therapy:  After Care Planning group  Pt. Did not attend group session.  [name] 05/18/2011, 10:36 AM

## 2011-05-18 NOTE — Progress Notes (Signed)
  NAIROBI PIPES is a 51 y.o. male [id number] 1960/07/07  05/12/2011 Principal Problem:  *Bipolar affect, depressed   Mental Status: Alert but drowsy. Ambulation is off. Wants the Tegretol stopped.Came in to hospital for SI. Has had SI on and off for years. Is not a medication kind of guy.     Subjective/Objective: [place] today because he can't walk correctly. Asked him how we could help if he doesn't want meds? Says overall he is not where he thought he should be at this point in life.     Filed Vitals:   05/18/11 0731  BP: 144/95  Pulse: 92  Temp:   Resp:     Lab Results:   BMET    Component Value Date/Time   NA 136 05/10/2011 2115   K 5.1 05/10/2011 2115   CL 98 05/10/2011 2115   CO2 29 05/10/2011 2115   GLUCOSE 306* 05/10/2011 2115   BUN 12 05/10/2011 2115   CREATININE 0.86 05/10/2011 2115   CALCIUM 10.0 05/10/2011 2115   GFRNONAA >90 05/10/2011 2115   GFRAA >90 05/10/2011 2115    Medications:  Scheduled:     . benazepril  10 mg Oral Daily  . citalopram  20 mg Oral Daily  . insulin aspart  0-15 Units Subcutaneous TID WC  . insulin aspart  0-5 Units Subcutaneous QHS  . insulin aspart protamine-insulin aspart  20 Units Subcutaneous [id number]  . insulin aspart protamine-insulin aspart  20 Units Subcutaneous QAC supper  . metFORMIN  1,000 mg Oral BID WC  . DISCONTD: carbamazepine  200 mg Oral QID     PRN Meds alum & mag hydroxide-simeth, ibuprofen, magnesium hydroxide, traZODone  Plan: stop Tegretol           Reassess in am for meds.  Alailah Safley,MICKIE D. 05/18/2011

## 2011-05-18 NOTE — Progress Notes (Signed)
[facility] Group Notes:  (Counselor/Nursing/MHT/Case Management/Adjunct)  05/18/2011 2:38 PM  Type of Therapy:  Group Therapy  Participation Level:  Did Not Attend   Roslynn Amble 05/18/2011, 2:38 PM

## 2011-05-18 NOTE — Progress Notes (Addendum)
05/17/2011 Nursing 1400 D [name] has been OOB UAL today on the 500 hall...he complains of feeling groggy. He did attend his AM group.Marland Kitchenthen went to his Life SKills group and then to lunch. HE is cooperative and pleasant. HE went to the cafeteria for lunch...refusing the sliding scale insulin that is ordered for him at lunch ( for cbg of 229). After he came back from lunch.he was observed sitting in the hall with his head leaning back against the wall and his eyes closed. HE stated he felt like " the room was spinning " and he was encouraged to stay sitting, by this nurse. He ambulated down the hall into his room and then his roommate called for nursing assistance saying " he's on the floor". Pt observed sitting on the floor with his back leaning up against the side of the bed. HE was alert and oriented. HE denied hitting his head and there is no observeable injury seen. He denies any pain anywhere. He was assisted to get into bed and pt stated he ambulated into his room and leaned on the bed ( to lay down) and when he leaned on the bed, he stated it slid out from under him ( bed was noted to be in unlocked position). A Vital signs were obtained and BP 140/81 HRR 93 and RR 16. MD ( Dr. B ) made aware and requested they see him for f/u assessment. Housekeeping was called and they assessed bed and corrected lock on pt's bed. D Pt is resting quietly in his bed and safety is maintained and POC cont with therapeutic relationship intact. PD RN [facility] 05/18/2011 Nursing addendum 1900 [name] has been in and out of his bed .Marland KitchenMarland KitchenUAL this afternoon into the  Evening ...tolerated well. Physician spoke with him and decreased his QID tegretol to TID. He has been in the dayroom...interacting with hi8s peers this afternoon / evening appropriately and is more alert and oriented this evening. He refused sliding scale insulin scheduled at dinnertime...for cbg 231, but did allow nurse to administer 70 / 30 insulin 20 units at dinnertime. PD RN [facility]

## 2011-05-18 NOTE — Progress Notes (Signed)
Pt is asleep at this time. Respirations are even and unlabored. Writer will continue to monitor pt. Pt safety remains with q26min checks.

## 2011-05-19 LAB — GLUCOSE, CAPILLARY
Glucose-Capillary: 174 mg/dL — ABNORMAL HIGH (ref 70–99)
Glucose-Capillary: 157 mg/dL — ABNORMAL HIGH (ref 70–99)
Glucose-Capillary: 249 mg/dL — ABNORMAL HIGH (ref 70–99)
Glucose-Capillary: 268 mg/dL — ABNORMAL HIGH (ref 70–99)

## 2011-05-19 NOTE — Progress Notes (Signed)
Patient ID: [name], male   DOB: 1960/11/06, 51 y.o.   MRN: [id number] Has been sleeping tonight without c/o's, eyes are closed, resp reg and unlabored.  Will continue q 15 min checks for safety.

## 2011-05-19 NOTE — Progress Notes (Signed)
  [name] is a 51 y.o. male [id number] 1960/12/21  05/12/2011 Principal Problem:  *Bipolar affect, depressed   Mental Status: Denies SI/HI/AVH mood is his normal.     Subjective/Objective: Not falling today. Appropriately asks about accessing a computer so his unemployment card can be refilled.  Denies a need for meds. Says he has had numerous prior admissions to [facility] once.  Encouraged him to request Vocational Rehab and therapy.     Filed Vitals:   05/19/11 0731  BP: 135/92  Pulse: 88  Temp:   Resp:     Lab Results:   BMET    Component Value Date/Time   NA 136 05/10/2011 2115   K 5.1 05/10/2011 2115   CL 98 05/10/2011 2115   CO2 29 05/10/2011 2115   GLUCOSE 306* 05/10/2011 2115   BUN 12 05/10/2011 2115   CREATININE 0.86 05/10/2011 2115   CALCIUM 10.0 05/10/2011 2115   GFRNONAA >90 05/10/2011 2115   GFRAA >90 05/10/2011 2115    Medications:  Scheduled:     . benazepril  10 mg Oral Daily  . citalopram  20 mg Oral Daily  . insulin aspart  0-15 Units Subcutaneous TID WC  . insulin aspart  0-5 Units Subcutaneous QHS  . insulin aspart protamine-insulin aspart  20 Units Subcutaneous BH-q7a  . insulin aspart protamine-insulin aspart  20 Units Subcutaneous QAC supper  . metFORMIN  1,000 mg Oral BID WC  . DISCONTD: carbamazepine  200 mg Oral QID     PRN Meds alum & mag hydroxide-simeth, ibuprofen, magnesium hydroxide, traZODone  Plan: continue with current plan of care.  Rosalene Wardrop,MICKIE D. 05/19/2011

## 2011-05-19 NOTE — Progress Notes (Signed)
Patient ID: [name], male   DOB: April 24, 1960, 51 y.o.   MRN: [id number]  Pt. attended and participated in aftercare planning group. Pt denied S/I and H/I. Pt. listed their current anxiety level as 3 and depression as a 3. Pt requested the [employer] bus schedule to [place], back home, for tomorrow as his transportation upon D/C. Case manager provided pt with the schedule. Pt has no other case management needs at this time.

## 2011-05-19 NOTE — Progress Notes (Signed)
[place] [place] Group Notes:  (Counselor/Nursing/MHT/Case Management/Adjunct)  05/19/2011 4:00 PM  Type of Therapy:  Group Therapy  Participation Level:  Active  Participation Quality:  Appropriate, Attentive, Sharing and Supportive  Affect:  Appropriate  Cognitive:  Appropriate  Insight:  Good  Engagement in Group:  Good  Engagement in Therapy:  Good  Modes of Intervention:  Clarification, Socialization and Support  Summary of Progress/Problems: Pt. participated in a support group on supports  and how to find supports when they are not available in their life. The  group discussed healthy and unhealthy support and were encouraged by therapist leading the group to seek healthy and multiple forms of support that can aid in their recovery. Pt. Spoke about not having supports and was encouraged to seek support from support groups and other sources. Pt.  stated he would try support groups.  Kendal Hymen [place] 05/19/2011, 4:00 PM

## 2011-05-19 NOTE — Progress Notes (Signed)
Patient ID: [name], male   DOB: 12/21/1960, 51 y.o.   MRN: [id number]  Eldridge Abrahams , therapist tried to help pt. With filing unemployment on Sunday 05/18/11,  but when the pt. put information it said that there was a problem with unemployment. Pt. stated this was a problem and stated e would not have the financial funds to take the [employer] bus from [place] to [place]. Pt. Was give a schedule of bus from [place] to [place] on 05/18/11 also. Pt. Needs to speak to case manger [place] on Monday.

## 2011-05-19 NOTE — Progress Notes (Signed)
05/19/2011 Nursing 1600 D [place] is status quo today...he laughs  A lot and hides behind his humore. He makes brief eye contact with this nurse this AM...he takes his medications but refuses his sliding scale insulin at lunch and this is documented by this nurse. A He completed his self invenltory and on it he wrote he denied SI, he rated his depression and hiopel;essness " 4 / 2  " and stated his DC plan was ' a good question". R Safety is maintained and POC icnldues fostering therapeutic relationship already establsiehd PD RN [facility]

## 2011-05-20 DIAGNOSIS — F319 Bipolar disorder, unspecified: Secondary | ICD-10-CM

## 2011-05-20 LAB — GLUCOSE, CAPILLARY
Glucose-Capillary: 155 mg/dL — ABNORMAL HIGH (ref 70–99)
Glucose-Capillary: 193 mg/dL — ABNORMAL HIGH (ref 70–99)

## 2011-05-20 MED ORDER — BENAZEPRIL HCL 10 MG PO TABS: 10.0000 mg | ORAL_TABLET | Freq: Every day | ORAL | Status: DC

## 2011-05-20 MED ORDER — METFORMIN HCL 500 MG PO TABS: 1000.0000 mg | ORAL_TABLET | Freq: Two times a day (BID) | ORAL | Status: DC

## 2011-05-20 MED ORDER — INSULIN NPH ISOPHANE & REGULAR (70-30) 100 UNIT/ML ~~LOC~~ SUSP: 20.0000 [IU] | Freq: Two times a day (BID) | SUBCUTANEOUS | Status: DC

## 2011-05-20 MED ORDER — TRAZODONE HCL 50 MG PO TABS: 50.0000 mg | ORAL_TABLET | Freq: Every evening | ORAL | Status: DC | PRN

## 2011-05-20 MED ORDER — CITALOPRAM HYDROBROMIDE 20 MG PO TABS: 20.0000 mg | ORAL_TABLET | Freq: Every day | ORAL | Status: AC

## 2011-05-20 MED FILL — Insulin Aspart Prot & Aspart (Human) Inj 100 Unit/ML (70-30): SUBCUTANEOUS | Qty: 0.2 | Status: AC

## 2011-05-20 NOTE — Progress Notes (Signed)
On self inventory sheet, sleeps well, improving appetite, low energy level, good attention span.  Rated depression and hopelessness zero.  Denied SI.   No pain.  After discharge plans to take meds and keep appts.
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
## 2011-05-20 NOTE — Progress Notes (Signed)
[facility] Group Notes:  (Counselor/Nursing/MHT/Case Management/Adjunct)    Type of Therapy:  Group Therapy  Participation Level:  Did Not Attend       Benard Halsted 05/20/2011  1:08 PM

## 2011-05-20 NOTE — Progress Notes (Signed)
[facility] Case Management Discharge Plan:  Will you be returning to the same living situation after discharge: No.  Patient plans to eventually return to [place] At discharge, do you have transportation home?:Yes,  City bus pass provided Do you have the ability to pay for your medications:No.  [place] will be assisted with indigent medications  Interagency Information:     Release of information consent forms completed and in the chart;  Patient's signature needed at discharge.  Patient to Follow up at:  Follow-up Information    Follow up with [facility]  on 10/10/2011. (You are scheduled with [facility] on  as needed Thursday, October 10, 2011 at 3:30 p.m.  Please present to their Emergency Room iat the address above if you need services before that date)    Contact information:   [address] [place], [place]   [address]  [phone]         Patient denies SI/HI:   Yes,  Patient no longer endorses SI    Safety Planning and Suicide Prevention discussed:  Yes,  Reviewed during aftercare planning groups  Barrier to discharge identified:Yes,  Homelessness and financial problems  Summary and Recommendations:  Follow up with outpatient recommendations.  If you have an emergency prior to scheduled appointment, please go to [facility]'s Emergency Department   Concha Pyo 05/20/2011, 10:56 AM

## 2011-05-20 NOTE — Progress Notes (Signed)
Patient ID: [name], male   DOB: March 25, 1960, 51 y.o.   MRN: [id number] Pt. Reports he had problems with one of his meds making him dizzy thinks it was "Tegretol". Writer informed pt. That he is not on Tegretol. Writer reviews pt. meds with him.  Pt. Reports taht he is ready go home. Staff will monitor q71min for safety.

## 2011-05-20 NOTE — Discharge Summary (Signed)
I agree with this D/C Summary.  

## 2011-05-20 NOTE — Tx Team (Signed)
Interdisciplinary Treatment Plan Update (Adult)  Date:  05/20/2011  Time Reviewed:  10:35 AM   Progress in Treatment: Attending groups: Yes Participating in groups:  Yes Taking medication as prescribed:  Yes Tolerating medication: Yes Family/Significant othe contact made:  No, refused consent for contact Patient understands diagnosis: Yes Discussing patient identified problems/goals with staff:  Yes Medical problems stabilized or resolved: Yes Denies suicidal/homicidal ideation: Yes Issues/concerns per patient self-inventory:  No  Other:  New problem(s) identified: None  Reason for Continuation of Hospitalization: Appropriate for discharge today  Interventions implemented related to continuation of hospitalization:  Medication stabilization, safety checks q 15 mins, group attendance  Additional comments:  Estimated length of stay: discharge today  Discharge Plan: Discharge back to [facility], follow up [place] goal(s):  Review of initial/current patient goals per problem list:   1. Goal(s): Reduce depressive symptoms  Met: Yes  Target date: by discharge  As evidenced by: Reducing depression from a 10 to a 3 as reported by pt. Pt ranks at a 0 today.  2. Goal (s): Reduce/Eliminate suicidal ideation  Met: Yes  Target date: by discharge  As evidenced by: pt denies SI 3. Goal(s): Reduce anxiety symptoms  Met: Yes Target date: by discharge  As evidenced by: Reduce anxiety from a 10 to a 3 as reported by pt. Pt ranks at a 2-3 today.    Attendees: Patient:  [name] 05/20/2011 10:35 AM  Family:     Physician:  Dr Rudean Curt, MD 05/20/2011 10:35 AM  Nursing:   Satira Sark, RN 05/20/2011 10:35 AM  Case Manager:  Joette Catching, LCSW 05/20/2011 10:35 AM  Counselor:  Lillie Fragmin, LCSW 05/20/2011 10:35 AM  Other:  Regan Lemming, [place] 05/20/2011 10:35 AM  Other:  Grayland Ormond, RN 05/20/2011 10:35 AM  Other:  Agustina Caroli, NP 05/20/2011 10:35 AM  Other:       Scribe for Treatment Team:   Benard Halsted, 05/20/2011 10:35 AM

## 2011-05-20 NOTE — Progress Notes (Addendum)
Discharge Note:   Patient has bus tickets for [employer].  Will be going to [place] by [employer] today.   Patient denied SI and HI.  Denied A/V hallucinations.   Denied pain.   Patient received all his belongings, prescriptions, medicatons, clothing, shoes, gray sweatshirt,  belt, cap, phone, wallet, all id's, and credit cards.  Patient received his knife out of the safe.   Suicide prevention information was given to patient, discussed with patient, who stated he understood and had no questions.   Patient stated he appreciated all the staff has done to help him while at [facility]. Patient has been pleasant and alert, attended groups and gone to dining room for meals.

## 2011-05-23 NOTE — Progress Notes (Signed)
Patient Discharge Instructions:  Psychiatric Admission Assessment Note Provided,  05/22/2011 After Visit Summary (AVS) Provided,  05/22/2011 Face Sheet Provided, 05/22/2011 Faxed/Sent to the Next Level Care provider:  05/22/2011 Sent Suicide Risk Assessment - Discharge Assessment 05/22/2011  Faxed to [facility] @ [phone]  Jola Baptist, 05/23/2011, 5:39 PM

## 2011-09-03 IMAGING — CR DG CHEST 2V
2 series · 2 of 2 positions shown · non-contrast
Comparison: 03/15/2007

CLINICAL DATA: Productive cough.  Chest pain.

CHEST - 2 VIEW

[w chest pa]
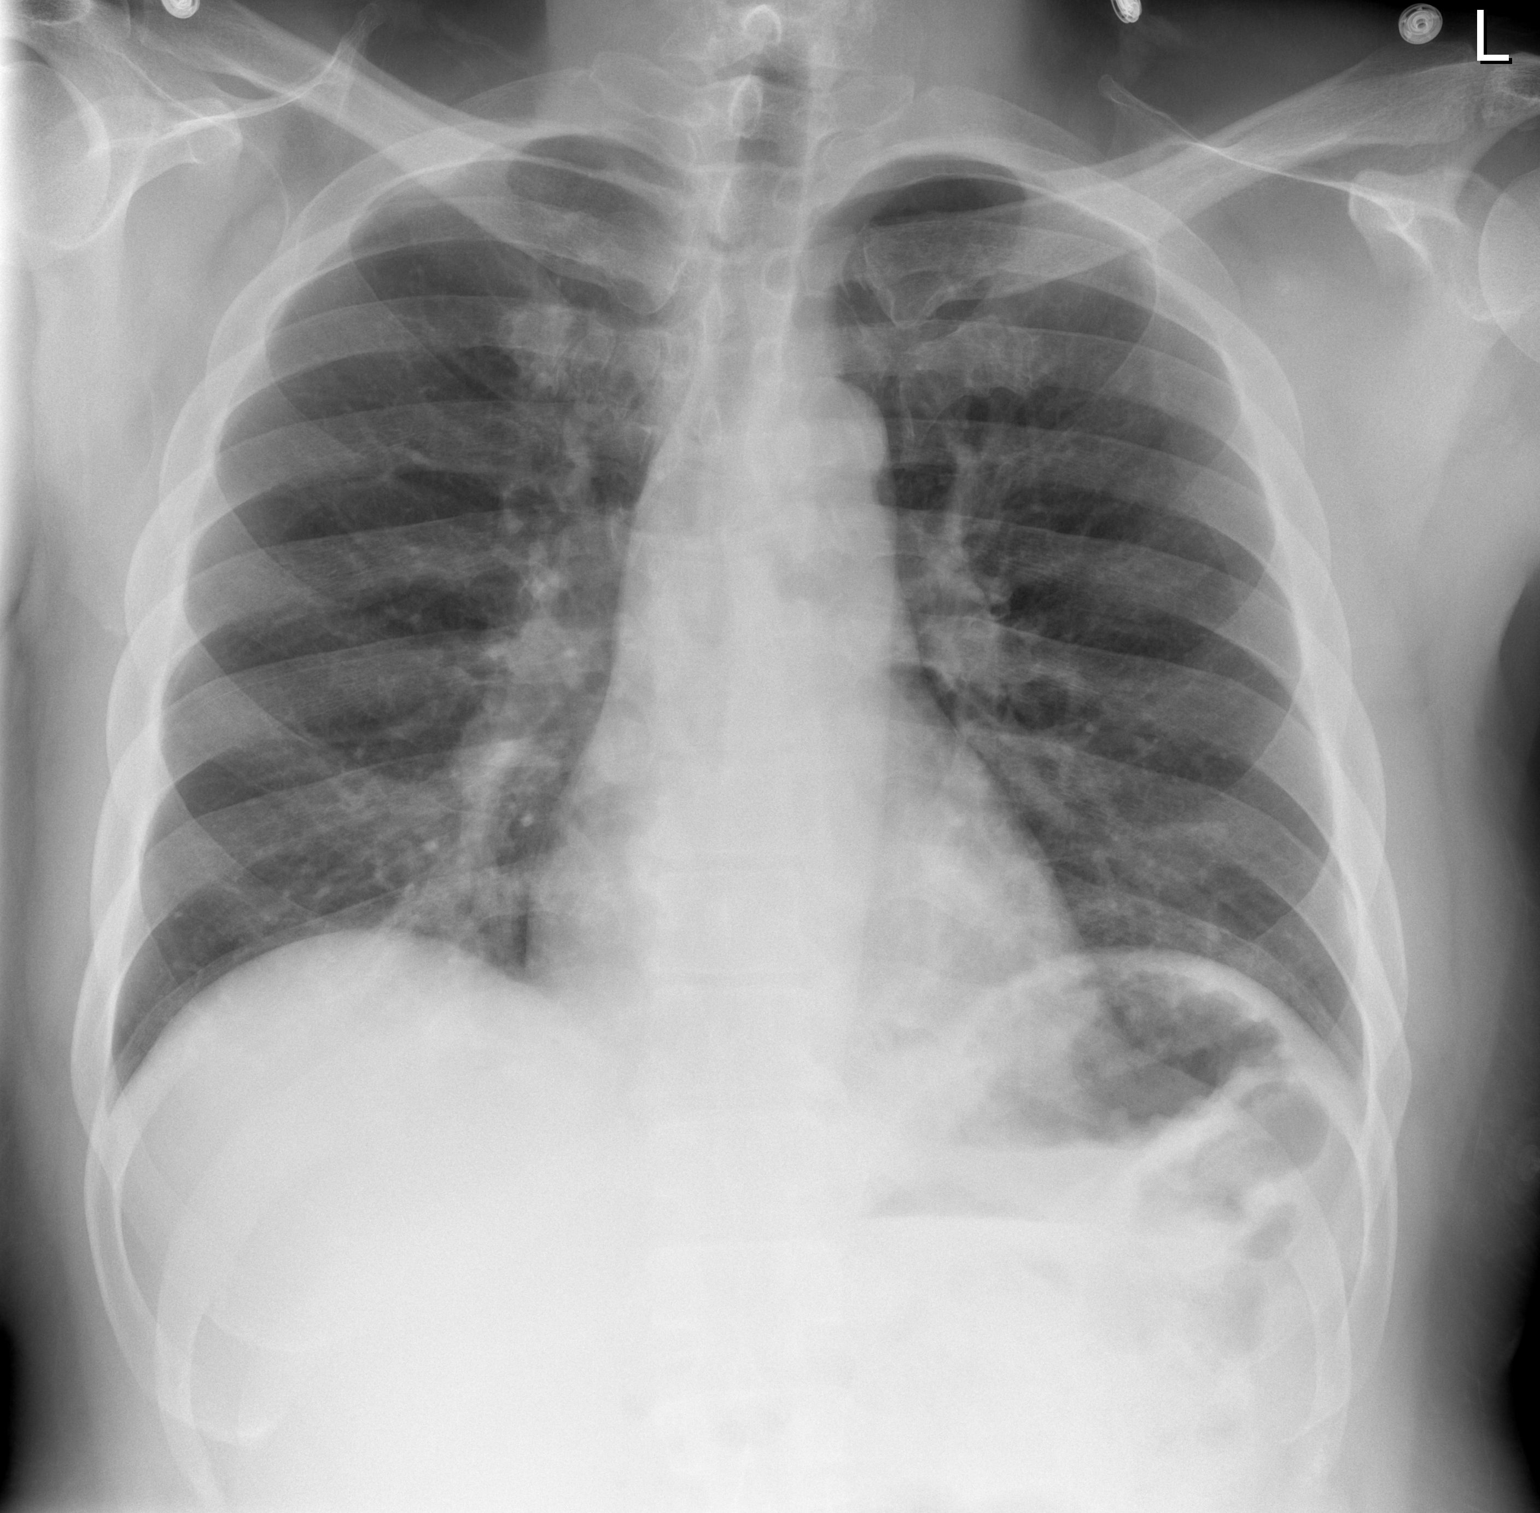

[w chest lat]
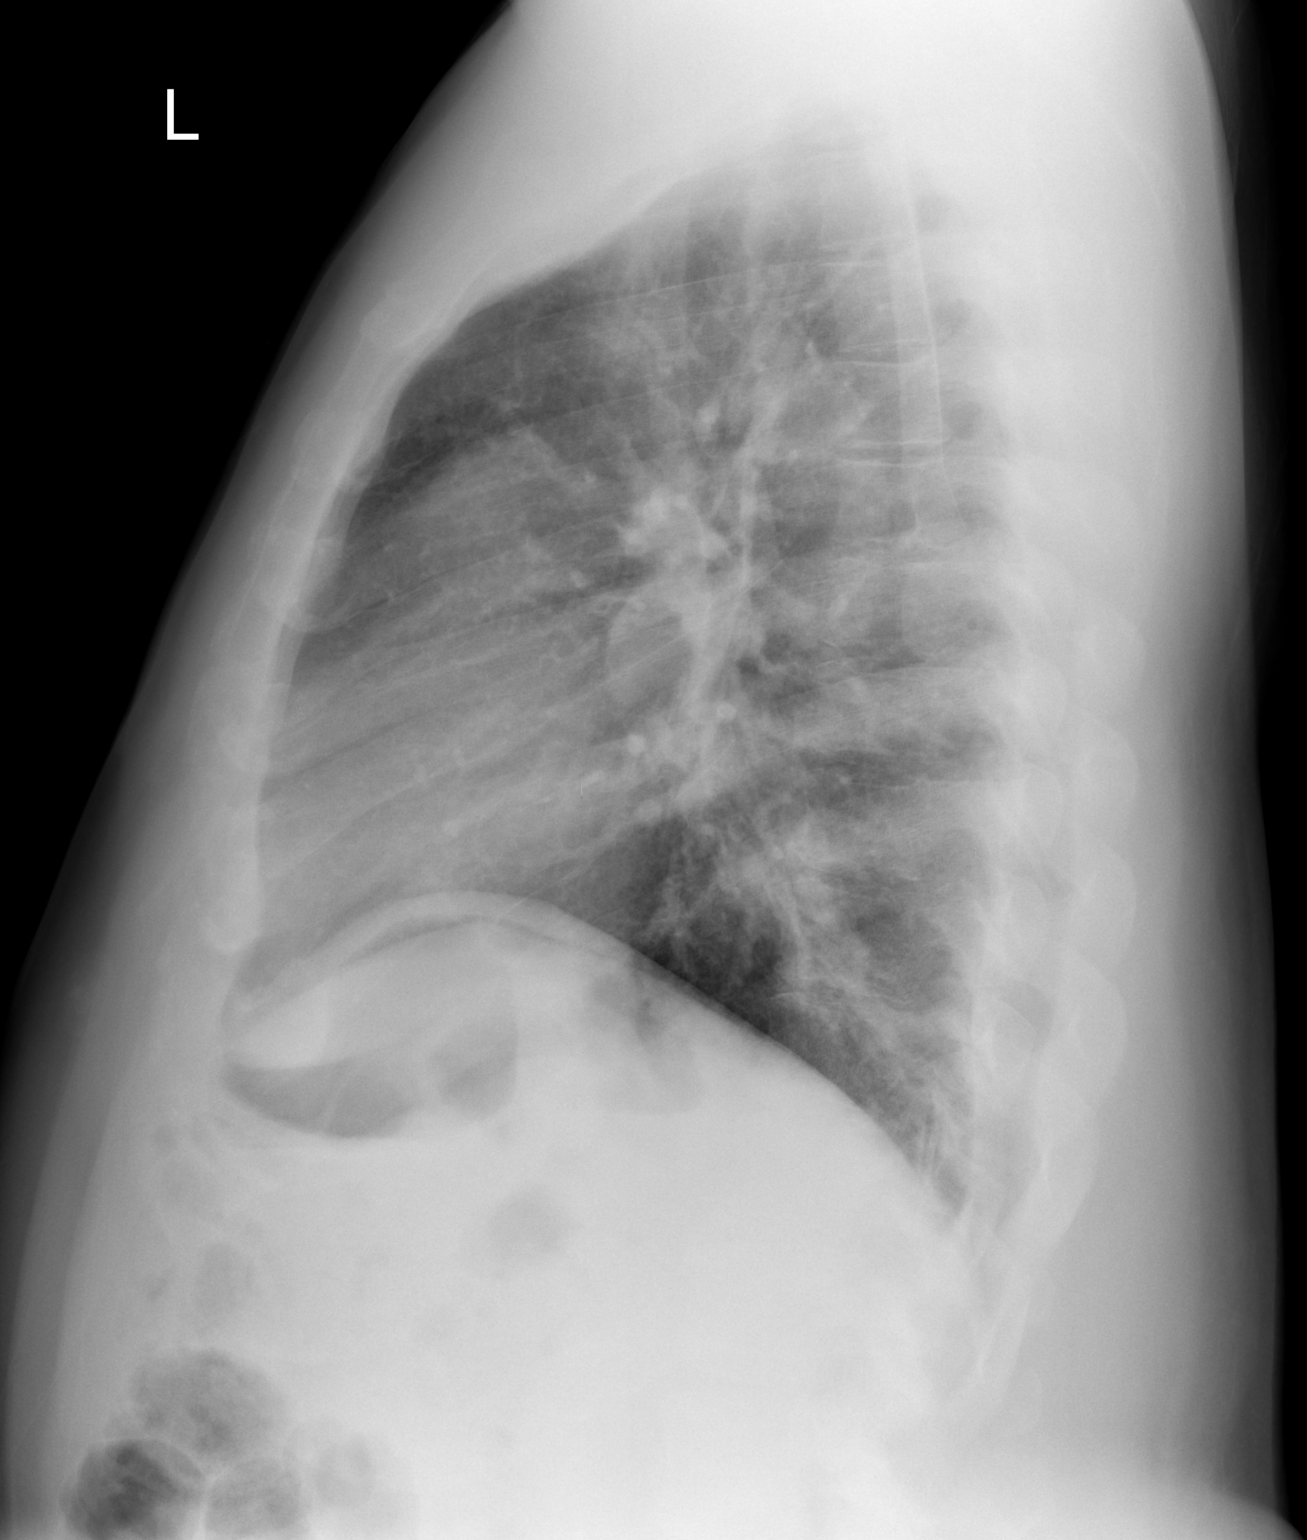

[2 of 2 positions shown; findings below may reference images not displayed]

FINDINGS: Asymmetric opacity is seen in the central right lower
lobe, suspicious for pneumonia.  Lungs are otherwise clear.  No
evidence of pleural effusion.  Heart size and mediastinal contours
are within normal limits.
IMPRESSION: Asymmetric opacity in the central right lower lobe, suspicious for
pneumonia. Post-treatment  radiographic followup recommended to
confirm resolution.

## 2011-10-09 IMAGING — CR DG KNEE COMPLETE 4+V*R*
4 series · 4 of 4 positions shown · non-contrast
Comparison: None

CLINICAL DATA: Right knee pain.

RIGHT KNEE - COMPLETE 4+ VIEW

[t knee ap right]
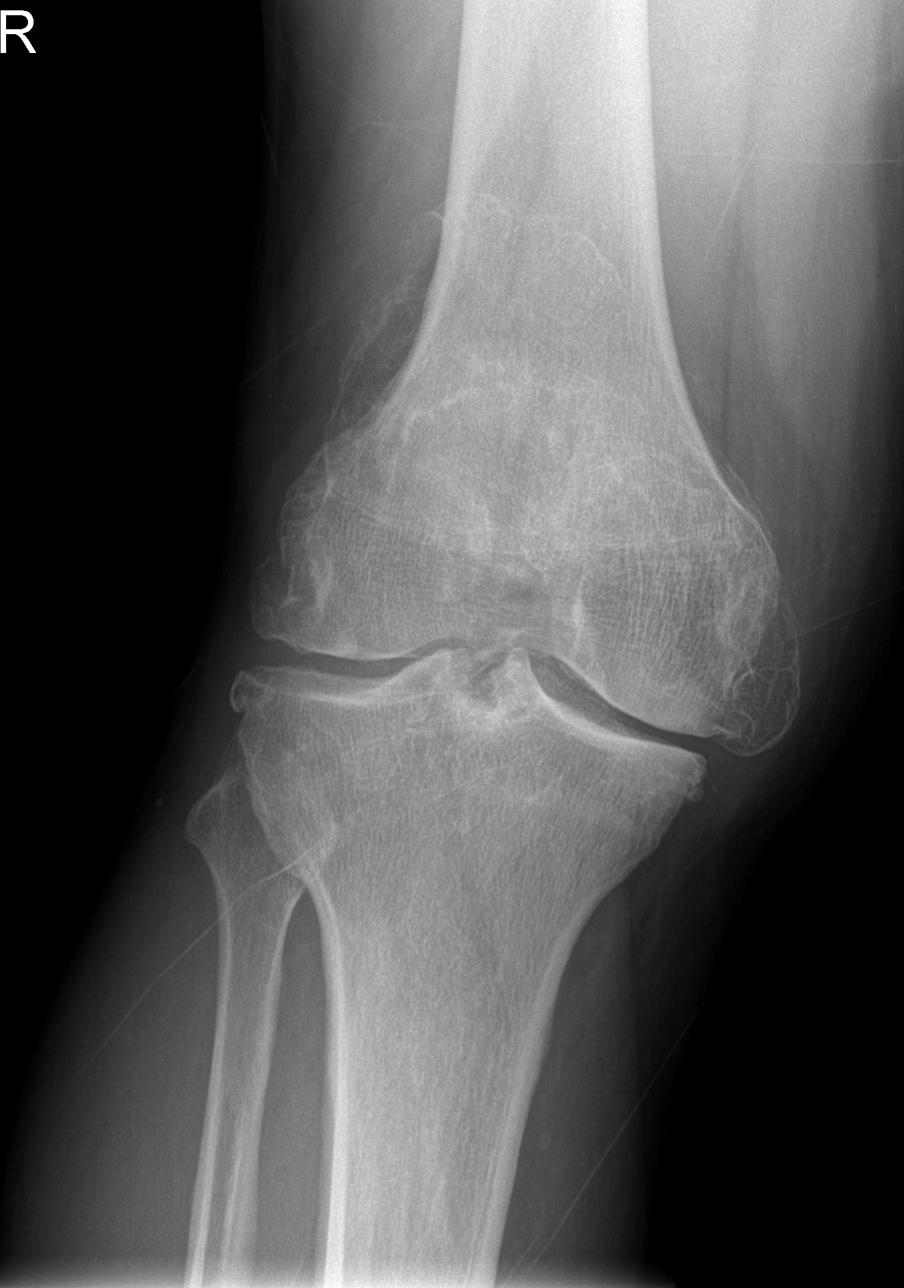

[t knee oblique right (1 of 2)]
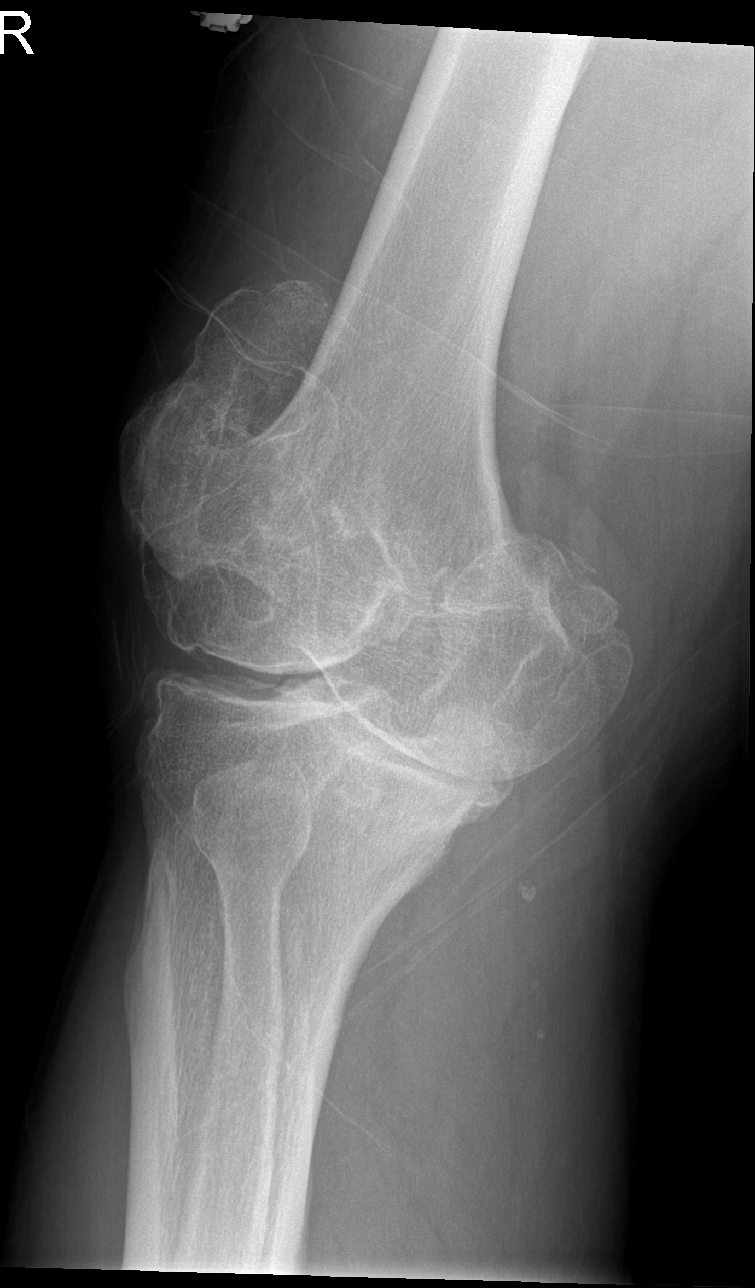

[t knee oblique right (2 of 2)]
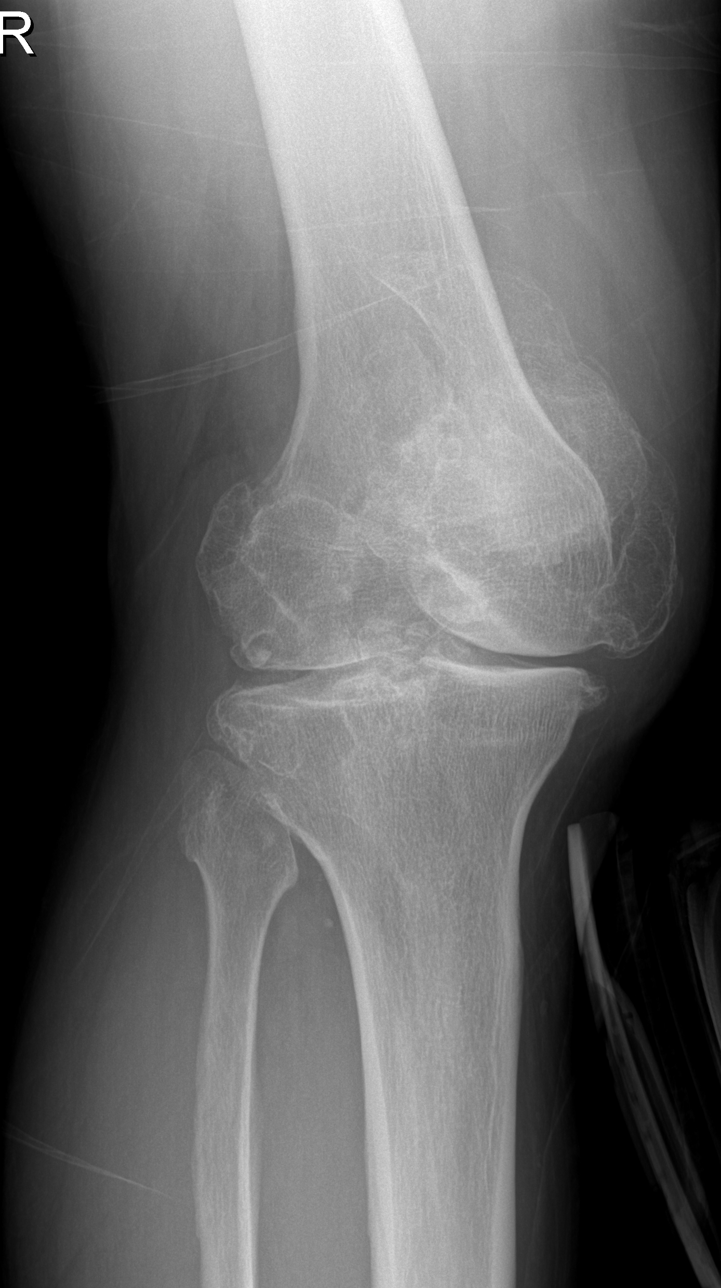

[t knee lat right]
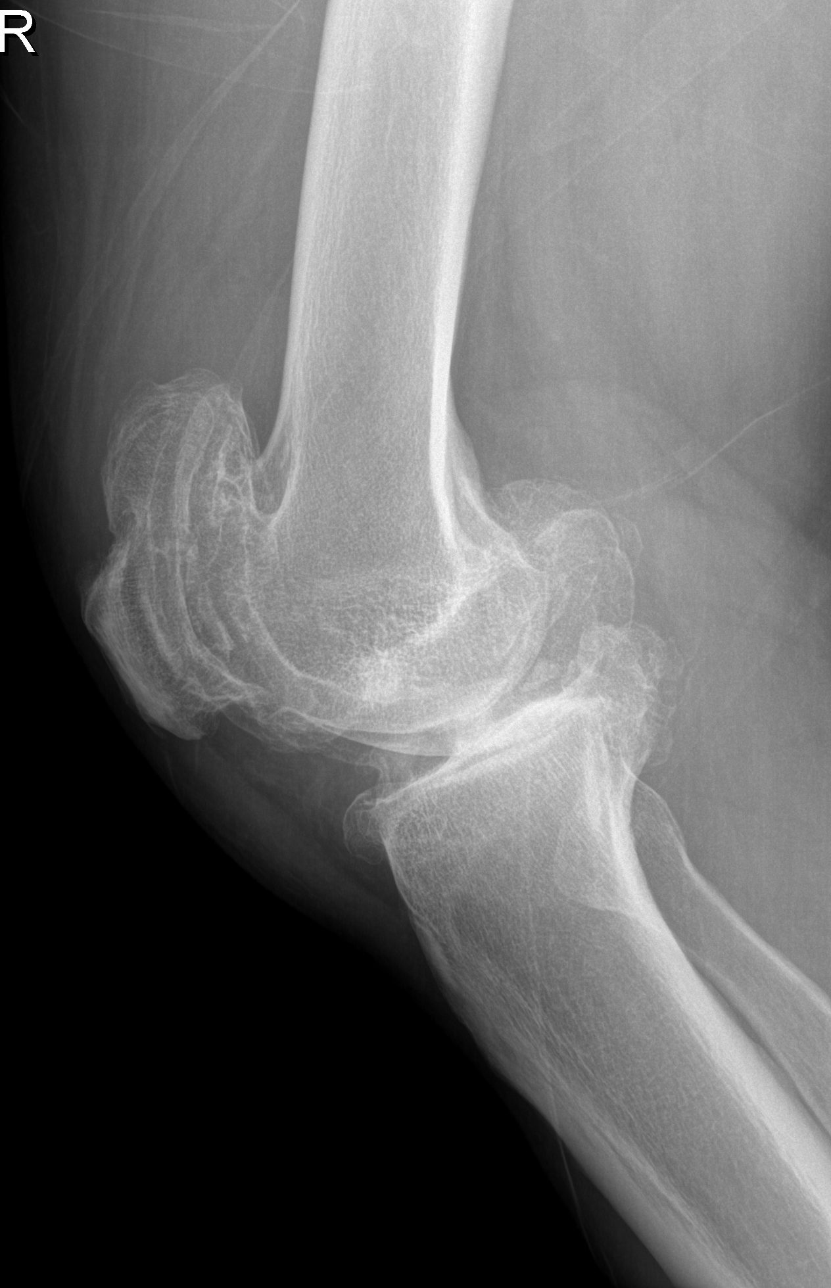

[4 of 4 positions shown; findings below may reference images not displayed]

FINDINGS: There is severe tricompartmental degenerative disease
with marked bony overgrowth and spurring.  Findings could be due to
remote post-traumatic changes but CPPD arthropathy would be a
strong consideration.  No acute fracture.  Probable joint effusion.
IMPRESSION: Severe tricompartmental degenerative changes most notable in the
patellofemoral joint with marked bony overgrowth.  CPPD arthropathy
would be a strong consideration.
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
## 2011-10-10 IMAGING — CR DG CHEST 2V
2 series · 2 of 2 positions shown · non-contrast
Comparison: Chest x-ray 12/24/2009

CLINICAL DATA: Syncopal episode.  Dizziness.

CHEST - 2 VIEW

[w chest pa]
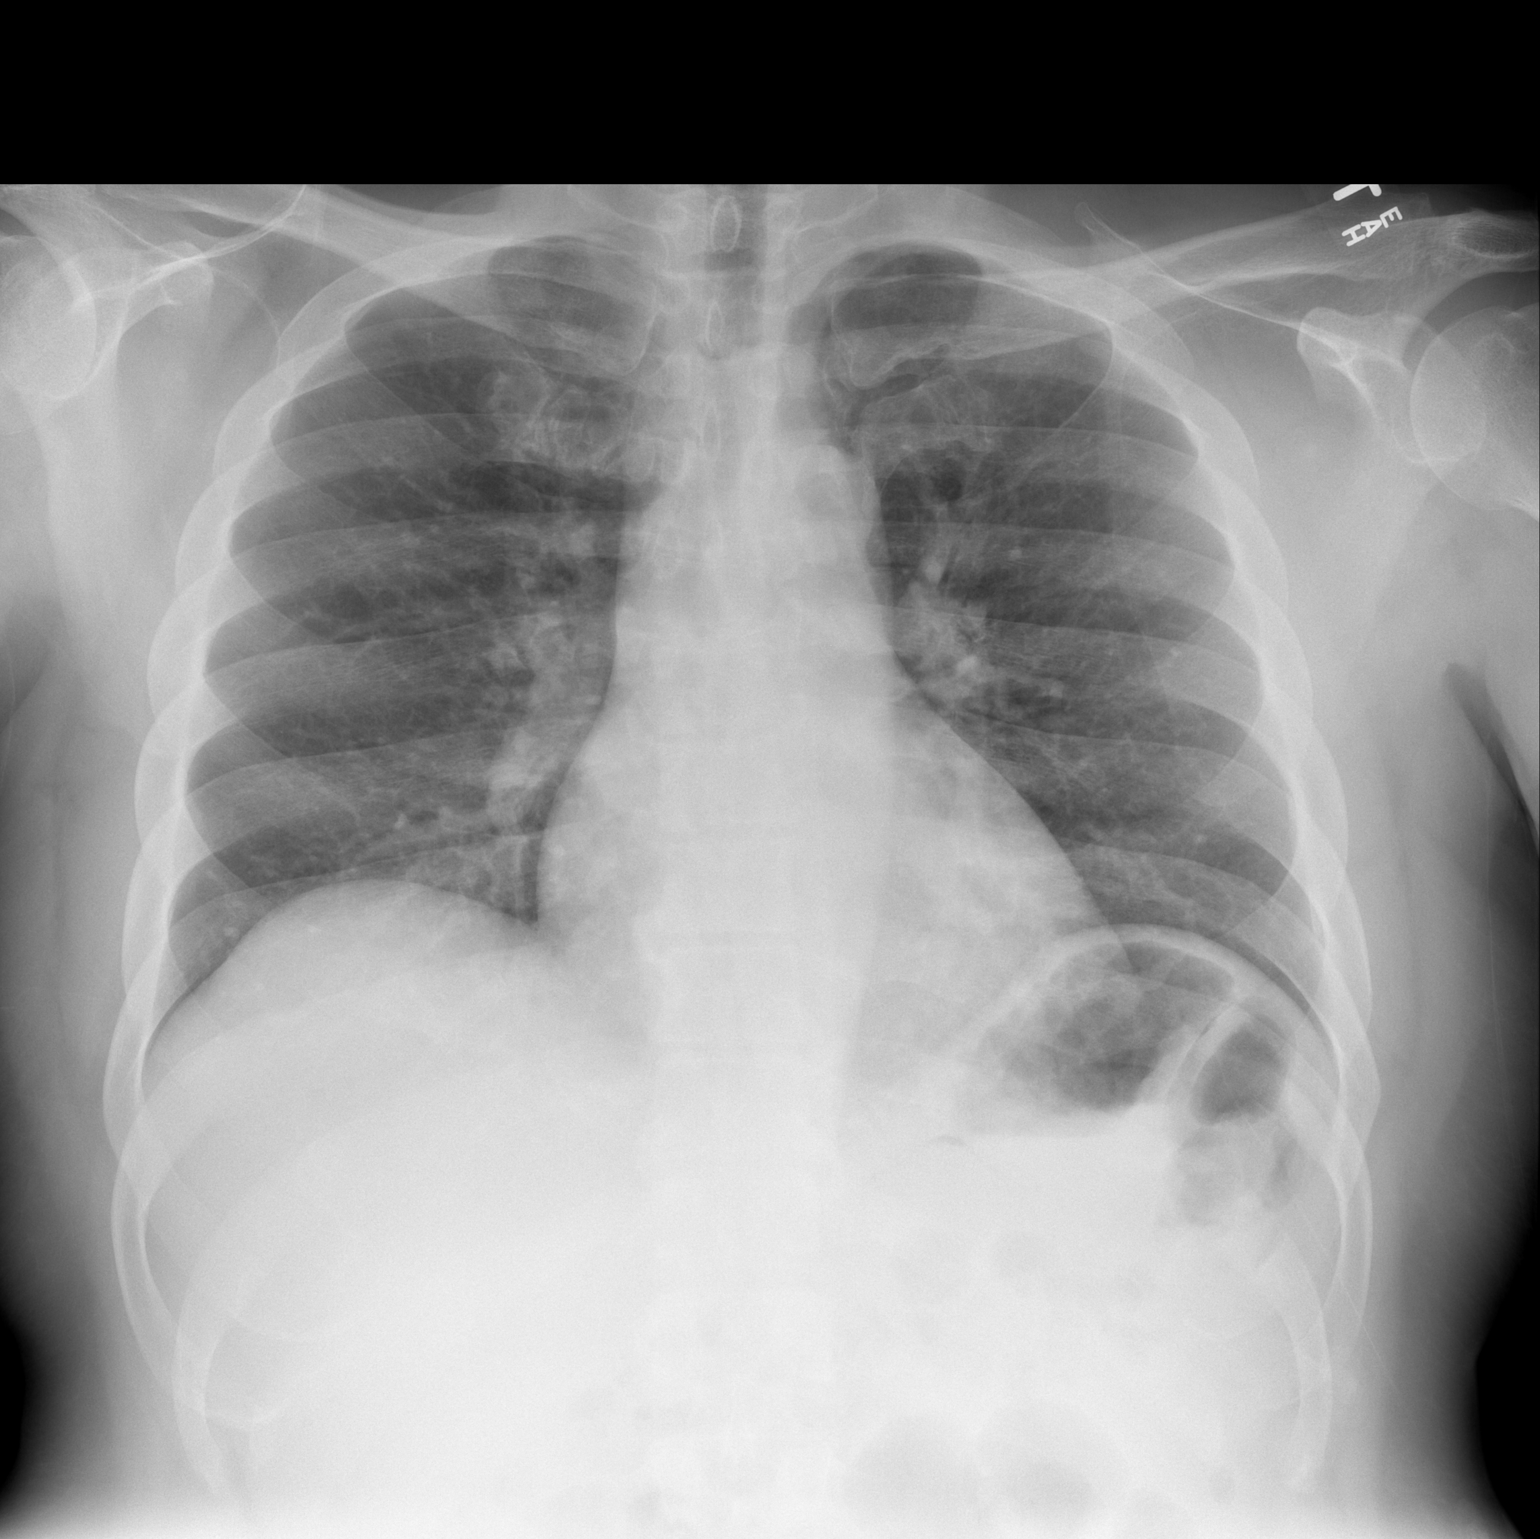

[w chest lat]
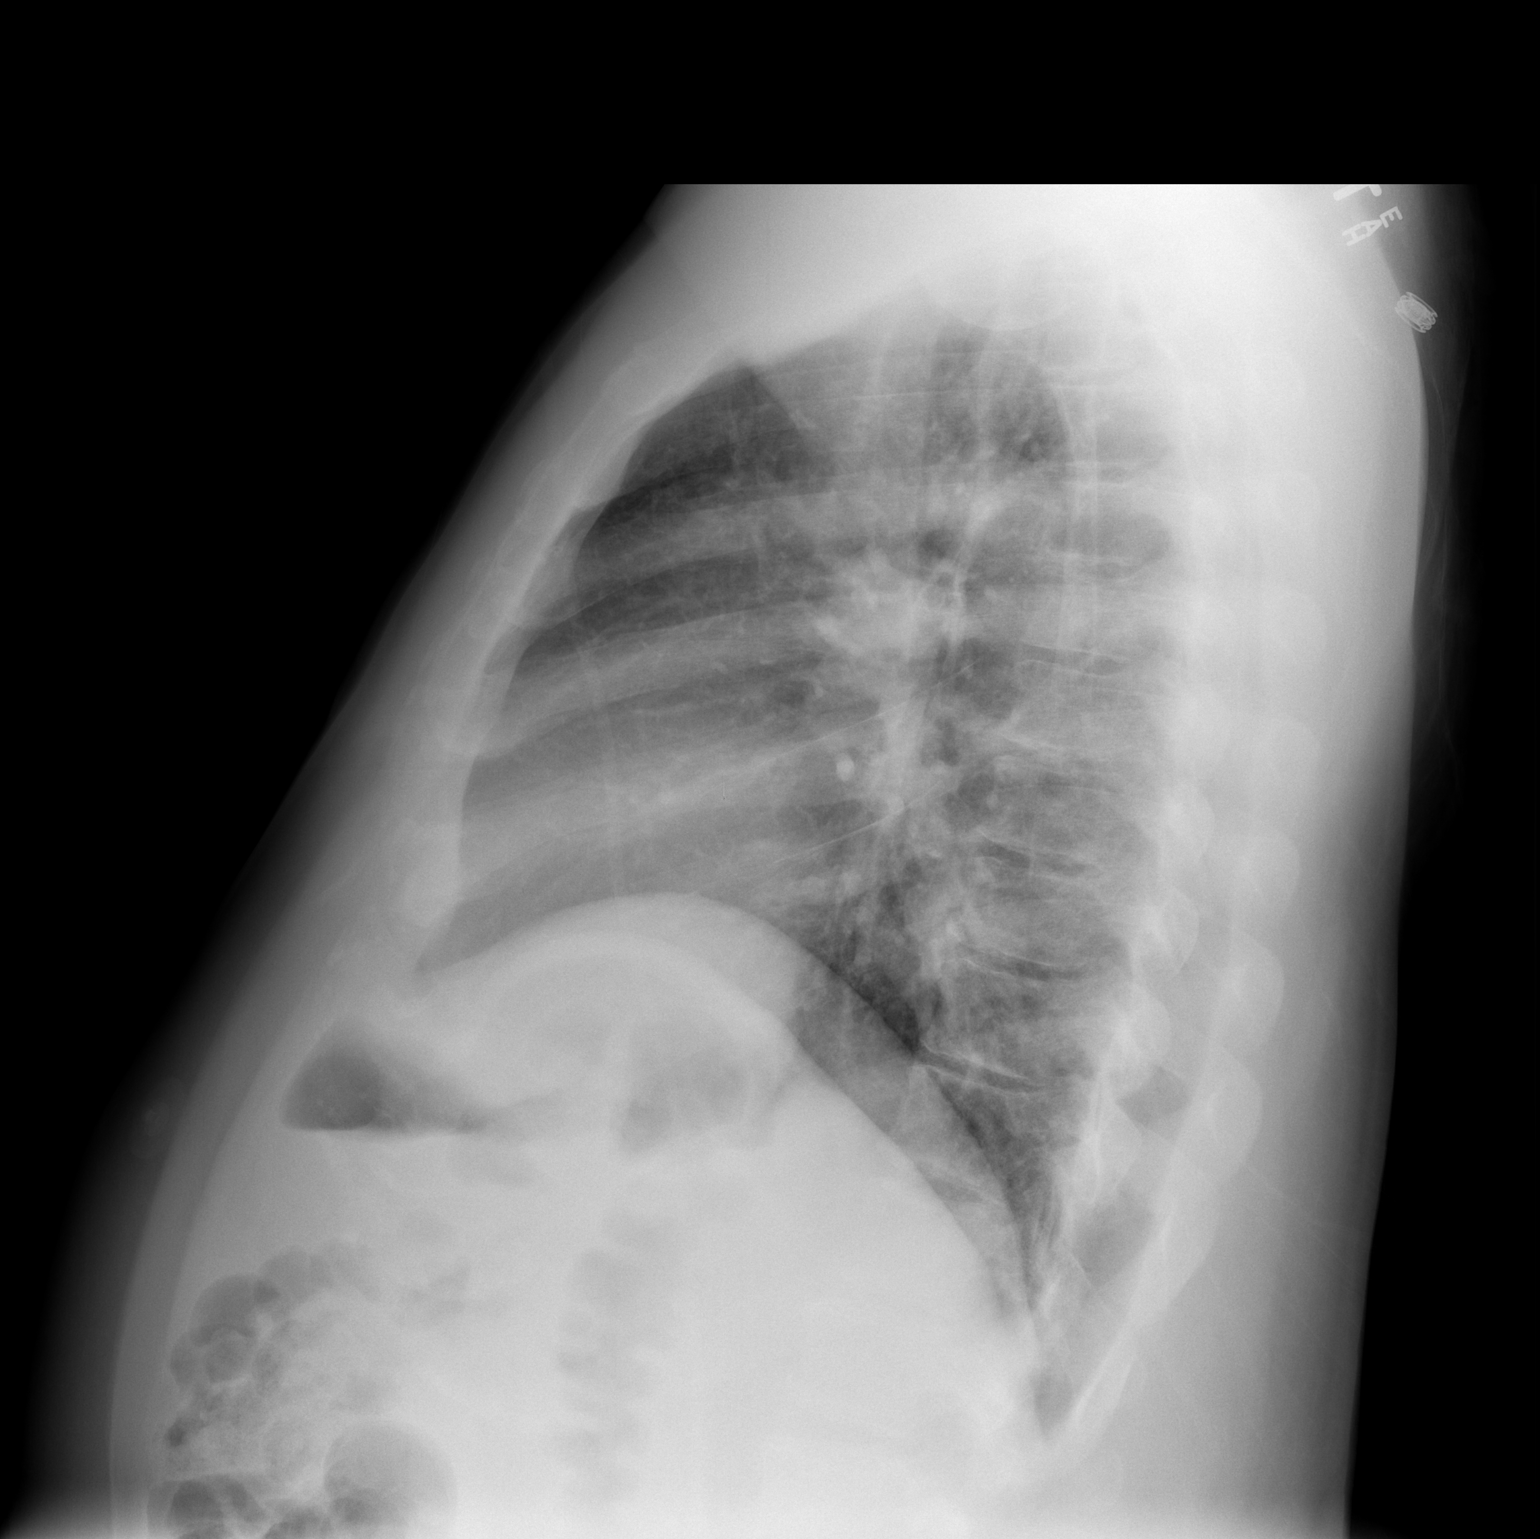

[2 of 2 positions shown; findings below may reference images not displayed]

FINDINGS: The cardiac silhouette, mediastinal and hilar contours
are within normal limits.  The lungs are clear.  Low lung volumes
with mild vascular crowding and streaky basilar atelectasis.  No
pleural effusion.  The bony thorax is intact.
IMPRESSION: No acute cardiopulmonary findings.

## 2011-12-09 DIAGNOSIS — R0789 Other chest pain: Secondary | ICD-10-CM | POA: Diagnosis present

## 2011-12-09 DIAGNOSIS — I10 Essential (primary) hypertension: Secondary | ICD-10-CM | POA: Diagnosis present

## 2011-12-09 DIAGNOSIS — M549 Dorsalgia, unspecified: Secondary | ICD-10-CM | POA: Diagnosis present

## 2013-05-31 ENCOUNTER — Emergency Department (HOSPITAL_COMMUNITY)
Admission: EM | Admit: 2013-05-31 | Discharge: 2013-05-31 | Disposition: A | Discharge status: Home / Self Care | Payer: Medicare Other | Attending: Emergency Medicine | Admitting: Emergency Medicine

## 2013-05-31 ENCOUNTER — Encounter (HOSPITAL_COMMUNITY): Payer: Self-pay | Admitting: Emergency Medicine

## 2013-05-31 DIAGNOSIS — R739 Hyperglycemia, unspecified: Secondary | ICD-10-CM

## 2013-05-31 DIAGNOSIS — Z79899 Other long term (current) drug therapy: Secondary | ICD-10-CM | POA: Insufficient documentation

## 2013-05-31 DIAGNOSIS — E1165 Type 2 diabetes mellitus with hyperglycemia: Secondary | ICD-10-CM

## 2013-05-31 DIAGNOSIS — I10 Essential (primary) hypertension: Secondary | ICD-10-CM | POA: Insufficient documentation

## 2013-05-31 DIAGNOSIS — Z794 Long term (current) use of insulin: Secondary | ICD-10-CM | POA: Insufficient documentation

## 2013-05-31 DIAGNOSIS — Z87891 Personal history of nicotine dependence: Secondary | ICD-10-CM | POA: Insufficient documentation

## 2013-05-31 DIAGNOSIS — E119 Type 2 diabetes mellitus without complications: Secondary | ICD-10-CM | POA: Insufficient documentation

## 2013-05-31 DIAGNOSIS — D72819 Decreased white blood cell count, unspecified: Secondary | ICD-10-CM | POA: Insufficient documentation

## 2013-05-31 LAB — CBC WITH DIFFERENTIAL/PLATELET
Basophils Absolute: 0 10*3/uL (ref 0.0–0.1)
Basophils Relative: 0 % (ref 0–1)
Eosinophils Absolute: 0 10*3/uL (ref 0.0–0.7)
Eosinophils Relative: 1 % (ref 0–5)
HCT: 36.3 % — ABNORMAL LOW (ref 39.0–52.0)
Hemoglobin: 12.4 g/dL — ABNORMAL LOW (ref 13.0–17.0)
Lymphocytes Relative: 50 % — ABNORMAL HIGH (ref 12–46)
Lymphs Abs: 1.4 10*3/uL (ref 0.7–4.0)
MCH: 26.6 pg (ref 26.0–34.0)
MCHC: 34.2 g/dL (ref 30.0–36.0)
MCV: 77.9 fL — ABNORMAL LOW (ref 78.0–100.0)
Monocytes Absolute: 0.3 10*3/uL (ref 0.1–1.0)
Monocytes Relative: 11 % (ref 3–12)
Neutro Abs: 1.1 10*3/uL — ABNORMAL LOW (ref 1.7–7.7)
Neutrophils Relative %: 38 % — ABNORMAL LOW (ref 43–77)
Platelets: 142 10*3/uL — ABNORMAL LOW (ref 150–400)
RBC: 4.66 MIL/uL (ref 4.22–5.81)
RDW: 13.4 % (ref 11.5–15.5)
WBC: 2.8 10*3/uL — ABNORMAL LOW (ref 4.0–10.5)

## 2013-05-31 LAB — URINALYSIS, ROUTINE W REFLEX MICROSCOPIC
Bilirubin Urine: NEGATIVE
Glucose, UA: >1000 mg/dL — AB
Hgb urine dipstick: NEGATIVE
Ketones, ur: NEGATIVE mg/dL
Leukocytes, UA: NEGATIVE
Nitrite: NEGATIVE
Protein, ur: NEGATIVE mg/dL
Specific Gravity, Urine: 1.037 — ABNORMAL HIGH (ref 1.005–1.030)
Urobilinogen, UA: 0.2 mg/dL (ref 0.0–1.0)
pH: 6 (ref 5.0–8.0)

## 2013-05-31 LAB — BASIC METABOLIC PANEL
BUN: 11 mg/dL (ref 6–23)
CO2: 26 mEq/L (ref 19–32)
Calcium: 8.7 mg/dL (ref 8.4–10.5)
Chloride: 98 mEq/L (ref 96–112)
Creatinine, Ser: 0.71 mg/dL (ref 0.50–1.35)
GFR calc Af Amer: >90 mL/min (ref >90)
GFR calc non Af Amer: >90 mL/min (ref >90)
Glucose, Bld: 387 mg/dL — ABNORMAL HIGH (ref 70–99)
Potassium: 4.1 mEq/L (ref 3.7–5.3)
Sodium: 137 mEq/L (ref 137–147)

## 2013-05-31 LAB — CBG MONITORING, ED
Glucose-Capillary: 341 mg/dL — ABNORMAL HIGH (ref 70–99)
Glucose-Capillary: 202 mg/dL — ABNORMAL HIGH (ref 70–99)

## 2013-05-31 LAB — HEMOGLOBIN A1C
Hgb A1c MFr Bld: 11.9 % — ABNORMAL HIGH (ref <5.7)
Mean Plasma Glucose: 295 mg/dL — ABNORMAL HIGH (ref <117)

## 2013-05-31 LAB — URINE MICROSCOPIC-ADD ON
RBC / HPF: NONE SEEN RBC/hpf (ref <3)
WBC, UA: NONE SEEN WBC/hpf (ref <3)

## 2013-05-31 MED ORDER — INSULIN ASPART 100 UNIT/ML ~~LOC~~ SOLN
12.0000 [IU] | Freq: Once | SUBCUTANEOUS | Status: AC
  Administered 2013-05-31: 12 [IU] via INTRAVENOUS
  Filled 2013-05-31: qty 1

## 2013-05-31 MED ORDER — SODIUM CHLORIDE 0.9 % IV BOLUS (SEPSIS)
1000.0000 mL | Freq: Once | INTRAVENOUS | Status: AC
  Administered 2013-05-31: 1000 mL via INTRAVENOUS

## 2013-05-31 NOTE — ED Notes (Signed)
Pt to ED via [facility] for evaluation of hyperglycemia, pt reports his blood sugar hasn't been under 400 in the past 3 days.  Pt does take insulin but has not yet had any today, CBG 460 for EMS.  Pt reports some dizziness and blurred vision at present.  Denies pain at this time.

## 2013-05-31 NOTE — Discharge Instructions (Signed)
Increase your 70/30 to 50 units twice per day. Make an appointment to establish primary care as soon as you can.   Hyperglycemia Hyperglycemia occurs when the glucose (sugar) in your blood is too high. Hyperglycemia can happen for many reasons, but it most often happens to people who do not know they have diabetes or are not managing their diabetes properly.  CAUSES  Whether you have diabetes or not, there are other causes of hyperglycemia. Hyperglycemia can occur when you have diabetes, but it can also occur in other situations that you might not be as aware of, such as: Diabetes  If you have diabetes and are having problems controlling your blood glucose, hyperglycemia could occur because of some of the following reasons:  Not following your meal plan.  Not taking your diabetes medications or not taking it properly.  Exercising less or doing less activity than you normally do.  Being sick. Pre-diabetes  This cannot be ignored. Before people develop Type 2 diabetes, they almost always have "pre-diabetes." This is when your blood glucose levels are higher than normal, but not yet high enough to be diagnosed as diabetes. Research has shown that some long-term damage to the body, especially the heart and circulatory system, may already be occurring during pre-diabetes. If you take action to manage your blood glucose when you have pre-diabetes, you may delay or prevent Type 2 diabetes from developing. Stress  If you have diabetes, you may be "diet" controlled or on oral medications or insulin to control your diabetes. However, you may find that your blood glucose is higher than usual in the hospital whether you have diabetes or not. This is often referred to as "stress hyperglycemia." Stress can elevate your blood glucose. This happens because of hormones put out by the body during times of stress. If stress has been the cause of your high blood glucose, it can be followed regularly by your  caregiver. That way he/she can make sure your hyperglycemia does not continue to get worse or progress to diabetes. Steroids  Steroids are medications that act on the infection fighting system (immune system) to block inflammation or infection. One side effect can be a rise in blood glucose. Most people can produce enough extra insulin to allow for this rise, but for those who cannot, steroids make blood glucose levels go even higher. It is not unusual for steroid treatments to "uncover" diabetes that is developing. It is not always possible to determine if the hyperglycemia will go away after the steroids are stopped. A special blood test called an A1c is sometimes done to determine if your blood glucose was elevated before the steroids were started. SYMPTOMS  Thirsty.  Frequent urination.  Dry mouth.  Blurred vision.  Tired or fatigue.  Weakness.  Sleepy.  Tingling in feet or leg. DIAGNOSIS  Diagnosis is made by monitoring blood glucose in one or all of the following ways:  A1c test. This is a chemical found in your blood.  Fingerstick blood glucose monitoring.  Laboratory results. TREATMENT  First, knowing the cause of the hyperglycemia is important before the hyperglycemia can be treated. Treatment may include, but is not be limited to:  Education.  Change or adjustment in medications.  Change or adjustment in meal plan.  Treatment for an illness, infection, etc.  More frequent blood glucose monitoring.  Change in exercise plan.  Decreasing or stopping steroids.  Lifestyle changes. HOME CARE INSTRUCTIONS   Test your blood glucose as directed.  Exercise regularly.  Your caregiver will give you instructions about exercise. Pre-diabetes or diabetes which comes on with stress is helped by exercising.  Eat wholesome, balanced meals. Eat often and at regular, fixed times. Your caregiver or nutritionist will give you a meal plan to guide your sugar intake.  Being at  an ideal weight is important. If needed, losing as little as 10 to 15 pounds may help improve blood glucose levels. SEEK MEDICAL CARE IF:   You have questions about medicine, activity, or diet.  You continue to have symptoms (problems such as increased thirst, urination, or weight gain). SEEK IMMEDIATE MEDICAL CARE IF:   You are vomiting or have diarrhea.  Your breath smells fruity.  You are breathing faster or slower.  You are very sleepy or incoherent.  You have numbness, tingling, or pain in your feet or hands.  You have chest pain.  Your symptoms get worse even though you have been following your caregiver's orders.  If you have any other questions or concerns. Document Released: 08/07/2000 Document Revised: 05/06/2011 Document Reviewed: 06/10/2011 [facility] Patient Information 2014 [place], [place].  Monitoring for Diabetes There are two blood tests that help you monitor and manage your diabetes. These include:  An A1c (hemoglobin A1c) test.  This test is an average of your glucose (or blood sugar) control over the past 3 months.  This is recommended as a way for you and your caregiver to understand how well your glucose levels are controlled on the average.  Your A1c goal will be determined by your caregiver, but it is usually best if it is less than 6.5% to 7.0%.  Glucose (sugar) attaches itself to red blood cells. The amount of glucose then can then be measured. The amount of glucose on the cells depends on how high your blood glucose has been.  SMBG test (self-monitoring blood glucose).  Using a blood glucose monitor (meter) to do SMBG testing is an easy way to monitor the amount of glucose in your blood and can help you improve your control. The monitor will tell you what your blood glucose is at that very moment. Every person with diabetes should have a blood glucose monitor and know how to use it. The better you control your blood sugar on a daily basis, the better  your A1c levels will be. HOW OFTEN SHOULD I HAVE AN A1C LEVEL?  Every 3 months if your diabetes is not well controlled or if therapy has changed.  Every 6 months if you are meeting your treatment goals. HOW OFTEN SHOULD I DO SMBG TESTING?  Your caregiver will recommend how often you should test. Testing times are based on the kind of medicine you take, type of diabetes you have, and your blood glucose control. Testing times can include:  Type 1 diabetes: test 3 or 4 times a day or as directed.  Type 2 diabetes and if you are taking insulin and diabetes pills: test 3 or 4 times a day or as directed.  If you are taking diabetes pills only and not reaching your target A1c: test 2 to 4 times a day or as directed.  If you are taking diabetes pills and are controlling your diabetes well with diet and exercise, your caregiver will help you decide what is appropriate. WHAT TIME OF DAY SHOULD I TEST?  The best time of day to test your blood glucose depends on medications, mealtimes, exercise, and blood glucose control. It is best to test at different times because this will  help you know how you are doing throughout the day. Your caregiver will help you decide what is best. WHAT SHOULD MY BLOOD GLUCOSE BE? Blood glucose target goals may vary depending on each persons needs, whether they have type 1 or type 2 diabetes or what medications they are taking. However, as a general rule, blood glucose should be:  Before meals   70-130 mg/dl.  After meals    ..less than 180 mg/dl. CHECK YOUR BLOOD GLUCOSE IF:  You have symptoms of low blood sugar (hypoglycemia), which may include dizziness, shaking, sweating, chills and confusion.  You have symptoms of high blood sugar (hyperglycemia), which may include sleepiness, blurred vision, frequent urination and excessive thirst.  You are learning how meals, physical activity and medicine affect your blood glucose level. The more you learn about how various  foods, your medications, and activities affect you, the better job you will do of taking care of yourself.  You have a job in which poor control could cause safety problems while driving or operating machinery. CHECK YOUR BLOOD SUGAR MORE FREQUENTLY:  If you have medication or dietary changes.  If you begin taking other kinds of medicines.  If you become sick or your level of stress increases. With an illness, your blood sugar may even be high without eating.  Before and after exercise. Follow your caregiver's testing recommendations during this time.  TO DISPOSE OF SHARPS: Each city or state may have different regulations. Check with your public works or Theatre manager.  Sharps containers can be purchased from pharmacies.  Place all used sharps in a container. You do not need to replace any protective covers over the needle or break the needle.  Sharps should be contained in a ridge, leakproof, puncture-resistant container.  Plastic detergent bottle.  Bleach bottle.  When container is almost full, add a solution that is 1 part laundry bleach and 9 parts tap water (it is okay to use undiluted bleach if you wish). You may want to wear gloves since bleach can damage tissue. Let the solution sit for 30 minutes.  Carefully pour all the liquid into the sanitary sewer. Be sure to prevent the sharps from falling out.  Once liquid is drained, reseal the container with lid and tape it shut with duct tape. This will prevent the cap from coming off.  Dispose of the container with your regular household trash and waste. It is a good idea to let your trash hauler know that you will be disposing of sharps. Document Released: 02/14/2003 Document Revised: 11/06/2011 Document Reviewed: 08/15/2008 [facility] Patient Information 2014 [place], [place].

## 2013-05-31 NOTE — ED Notes (Signed)
Pt aware urine specimen is needed, states he is unable to urinate at this time, pt given a urinal.

## 2013-05-31 NOTE — ED Provider Notes (Signed)
CSN: [id number]     Arrival date & time 05/31/13  1026 History   First MD Initiated Contact with Patient 05/31/13 1027     Chief Complaint  Patient presents with  . Hyperglycemia     (Consider location/radiation/quality/duration/timing/severity/associated sxs/prior Treatment) HPI  53 year old male with hyperglycemia. Patient reports that his blood sugars have been consistently in the 400s over the past 3 days. Reports that normally they are much better controlled. He is on 70/30 insulin twice a day: Thousand milligrams of metformin twice a day. He has been on this regimen for quite some time. He reports that his last insulin dose was changed approximately one year ago when he was increased from 35 units to 45 units twice a day. Blood sugars normally run from 90 to approximately 180. He denies any recent medication changes. No fevers or chills. Denies any acute complaints and only presenting because he noticed that his sugars are consistently high. He does drink a lot water, but he says he is always there since childhood. No specific urinary complaints. Just moved back to area from [place]. No local PCP.     Past Medical History  Diagnosis Date  . Diabetes mellitus   . Hypertension    No past surgical history on file. No family history on file. History  Substance Use Topics  . Smoking status: Former Research scientist (life sciences)  . Smokeless tobacco: Not on file  . Alcohol Use: No    Review of Systems  All systems reviewed and negative, other than as noted in HPI.    Allergies  Review of patient's allergies indicates no known allergies.  Home Medications   Current Outpatient Rx  Name  Route  Sig  Dispense  Refill  . benazepril (LOTENSIN) 10 MG tablet   Oral   Take 1 tablet (10 mg total) by mouth daily. For control of high blood pressure   30 tablet   0   . insulin NPH-insulin regular (NOVOLIN 70/30) (70-30) 100 UNIT/ML injection   Subcutaneous   Inject 20 Units into the skin 2 (two) times  daily with a meal. For control of blood sugar   10 mL   0    BP 155/101  Temp(Src) 98.5 F (36.9 C) (Oral)  Resp 19  Ht 5\' 8"  (1.727 m)  Wt 267 lb (121.11 kg)  BMI 40.61 kg/m2  SpO2 99% Physical Exam  Nursing note and vitals reviewed. Constitutional: He is oriented to person, place, and time. He appears well-developed and well-nourished. No distress.  HENT:  Head: Normocephalic and atraumatic.  Eyes: Conjunctivae are normal. Right eye exhibits no discharge. Left eye exhibits no discharge.  Neck: Neck supple.  Cardiovascular: Normal rate, regular rhythm and normal heart sounds.  Exam reveals no gallop and no friction rub.   No murmur heard. Pulmonary/Chest: Effort normal and breath sounds normal. No respiratory distress.  Abdominal: Soft. He exhibits no distension. There is no tenderness.  Musculoskeletal: He exhibits no edema and no tenderness.  Neurological: He is alert and oriented to person, place, and time. No cranial nerve deficit. He exhibits normal muscle tone. Coordination normal.  Skin: Skin is warm and dry.  Psychiatric: He has a normal mood and affect. His behavior is normal. Thought content normal.    ED Course  Procedures (including critical care time) Labs Review Labs Reviewed  BASIC METABOLIC PANEL - Abnormal; Notable for the following:    Glucose, Bld 387 (*)    All other components within normal limits  CBC WITH  DIFFERENTIAL - Abnormal; Notable for the following:    WBC 2.8 (*)    Hemoglobin 12.4 (*)    HCT 36.3 (*)    MCV 77.9 (*)    Platelets 142 (*)    Neutrophils Relative % 38 (*)    Neutro Abs 1.1 (*)    Lymphocytes Relative 50 (*)    All other components within normal limits  URINALYSIS, ROUTINE W REFLEX MICROSCOPIC - Abnormal; Notable for the following:    Specific Gravity, Urine 1.037 (*)    Glucose, UA >1000 (*)    All other components within normal limits  HEMOGLOBIN A1C - Abnormal; Notable for the following:    Hemoglobin A1C 11.9 (*)     Mean Plasma Glucose 295 (*)    All other components within normal limits  CBG MONITORING, ED - Abnormal; Notable for the following:    Glucose-Capillary 341 (*)    All other components within normal limits  CBG MONITORING, ED - Abnormal; Notable for the following:    Glucose-Capillary 202 (*)    All other components within normal limits  URINE MICROSCOPIC-ADD ON   Imaging Review No results found.   EKG Interpretation None      MDM   Final diagnoses:  Hyperglycemia  Poorly controlled diabetes mellitus  Leukopenia    53 year old male with poorly controlled diabetes. No acidosis. Will have patient increase insulin from 45 units to 50 units. Resources provided. Needs to obtain a PCP or endocrinologist for further evaluation/management. Hemoglobin A1c ordered this may need followup provider. Additionally, noted to be neutropenic. Discussed this with patient. Is afebrile and well appearing. No further emergent intervention/evaluation for this at this time. Return precautions were discussed.    Virgel Manifold, MD 06/06/13 [phone]

## 2013-05-31 NOTE — ED Notes (Signed)
Pt still unable to urinate at this time 

## 2013-06-30 ENCOUNTER — Observation Stay (HOSPITAL_COMMUNITY)
Admission: EM | Admit: 2013-06-30 | Discharge: 2013-06-30 | Discharge status: Against Medical Advice | Payer: Medicare Other | Attending: Internal Medicine | Admitting: Internal Medicine

## 2013-06-30 ENCOUNTER — Emergency Department (HOSPITAL_COMMUNITY): Payer: Medicare Other

## 2013-06-30 ENCOUNTER — Encounter (HOSPITAL_COMMUNITY): Payer: Self-pay | Admitting: Emergency Medicine

## 2013-06-30 DIAGNOSIS — Z794 Long term (current) use of insulin: Secondary | ICD-10-CM | POA: Insufficient documentation

## 2013-06-30 DIAGNOSIS — Z87891 Personal history of nicotine dependence: Secondary | ICD-10-CM | POA: Insufficient documentation

## 2013-06-30 DIAGNOSIS — F39 Unspecified mood [affective] disorder: Secondary | ICD-10-CM | POA: Diagnosis present

## 2013-06-30 DIAGNOSIS — Z79899 Other long term (current) drug therapy: Secondary | ICD-10-CM | POA: Insufficient documentation

## 2013-06-30 DIAGNOSIS — Z1159 Encounter for screening for other viral diseases: Secondary | ICD-10-CM | POA: Insufficient documentation

## 2013-06-30 DIAGNOSIS — Z91199 Patient's noncompliance with other medical treatment and regimen due to unspecified reason: Secondary | ICD-10-CM | POA: Insufficient documentation

## 2013-06-30 DIAGNOSIS — E119 Type 2 diabetes mellitus without complications: Secondary | ICD-10-CM

## 2013-06-30 DIAGNOSIS — R0602 Shortness of breath: Secondary | ICD-10-CM | POA: Insufficient documentation

## 2013-06-30 DIAGNOSIS — M549 Dorsalgia, unspecified: Secondary | ICD-10-CM | POA: Diagnosis present

## 2013-06-30 DIAGNOSIS — E1165 Type 2 diabetes mellitus with hyperglycemia: Secondary | ICD-10-CM

## 2013-06-30 DIAGNOSIS — IMO0002 Reserved for concepts with insufficient information to code with codable children: Secondary | ICD-10-CM

## 2013-06-30 DIAGNOSIS — R739 Hyperglycemia, unspecified: Secondary | ICD-10-CM

## 2013-06-30 DIAGNOSIS — I10 Essential (primary) hypertension: Secondary | ICD-10-CM | POA: Insufficient documentation

## 2013-06-30 DIAGNOSIS — I129 Hypertensive chronic kidney disease with stage 1 through stage 4 chronic kidney disease, or unspecified chronic kidney disease: Secondary | ICD-10-CM

## 2013-06-30 DIAGNOSIS — IMO0001 Reserved for inherently not codable concepts without codable children: Secondary | ICD-10-CM | POA: Insufficient documentation

## 2013-06-30 DIAGNOSIS — Z9119 Patient's noncompliance with other medical treatment and regimen: Secondary | ICD-10-CM | POA: Insufficient documentation

## 2013-06-30 DIAGNOSIS — E1169 Type 2 diabetes mellitus with other specified complication: Secondary | ICD-10-CM

## 2013-06-30 DIAGNOSIS — R0789 Other chest pain: Principal | ICD-10-CM | POA: Insufficient documentation

## 2013-06-30 DIAGNOSIS — F319 Bipolar disorder, unspecified: Secondary | ICD-10-CM | POA: Insufficient documentation

## 2013-06-30 DIAGNOSIS — R079 Chest pain, unspecified: Secondary | ICD-10-CM

## 2013-06-30 LAB — CBC
HCT: 39.3 % (ref 39.0–52.0)
Hemoglobin: 13.6 g/dL (ref 13.0–17.0)
MCH: 26.8 pg (ref 26.0–34.0)
MCHC: 34.6 g/dL (ref 30.0–36.0)
MCV: 77.5 fL — ABNORMAL LOW (ref 78.0–100.0)
Platelets: 179 10*3/uL (ref 150–400)
RBC: 5.07 MIL/uL (ref 4.22–5.81)
RDW: 13.2 % (ref 11.5–15.5)
WBC: 2.3 10*3/uL — ABNORMAL LOW (ref 4.0–10.5)

## 2013-06-30 LAB — RAPID URINE DRUG SCREEN, HOSP PERFORMED
Amphetamines: NOT DETECTED
Barbiturates: NOT DETECTED
Benzodiazepines: NOT DETECTED
Cocaine: NOT DETECTED
Opiates: POSITIVE — AB
Tetrahydrocannabinol: NOT DETECTED

## 2013-06-30 LAB — URINALYSIS, ROUTINE W REFLEX MICROSCOPIC
Bilirubin Urine: NEGATIVE
Glucose, UA: >1000 mg/dL — AB
Hgb urine dipstick: NEGATIVE
Ketones, ur: NEGATIVE mg/dL
Leukocytes, UA: NEGATIVE
Nitrite: NEGATIVE
Protein, ur: NEGATIVE mg/dL
Specific Gravity, Urine: 1.034 — ABNORMAL HIGH (ref 1.005–1.030)
Urobilinogen, UA: 1 mg/dL (ref 0.0–1.0)
pH: 6 (ref 5.0–8.0)

## 2013-06-30 LAB — TROPONIN I: Troponin I: <0.3 ng/mL (ref <0.30)

## 2013-06-30 LAB — BASIC METABOLIC PANEL
BUN: 13 mg/dL (ref 6–23)
CO2: 25 mEq/L (ref 19–32)
Calcium: 9.5 mg/dL (ref 8.4–10.5)
Chloride: 98 mEq/L (ref 96–112)
Creatinine, Ser: 0.82 mg/dL (ref 0.50–1.35)
GFR calc Af Amer: >90 mL/min (ref >90)
GFR calc non Af Amer: >90 mL/min (ref >90)
Glucose, Bld: 451 mg/dL — ABNORMAL HIGH (ref 70–99)
Potassium: 5.1 mEq/L (ref 3.7–5.3)
Sodium: 133 mEq/L — ABNORMAL LOW (ref 137–147)

## 2013-06-30 LAB — URINE MICROSCOPIC-ADD ON

## 2013-06-30 LAB — PRO B NATRIURETIC PEPTIDE: Pro B Natriuretic peptide (BNP): 26.4 pg/mL (ref 0–125)

## 2013-06-30 LAB — CBG MONITORING, ED: Glucose-Capillary: 323 mg/dL — ABNORMAL HIGH (ref 70–99)

## 2013-06-30 LAB — I-STAT TROPONIN, ED: Troponin i, poc: 0 ng/mL (ref 0.00–0.08)

## 2013-06-30 MED ORDER — SODIUM CHLORIDE 0.9 % IV BOLUS (SEPSIS)
1000.0000 mL | Freq: Once | INTRAVENOUS | Status: DC
Start: 2013-06-30 — End: 2013-06-30

## 2013-06-30 MED ORDER — SODIUM CHLORIDE 0.9 % IV SOLN
INTRAVENOUS | Status: DC
  Administered 2013-06-30: 15:00:00 via INTRAVENOUS

## 2013-06-30 MED ORDER — ASPIRIN 81 MG PO CHEW
324.0000 mg | CHEWABLE_TABLET | Freq: Once | ORAL | Status: AC
  Administered 2013-06-30: 324 mg via ORAL
  Filled 2013-06-30: qty 4

## 2013-06-30 MED ORDER — NITROGLYCERIN 0.4 MG SL SUBL
0.4000 mg | SUBLINGUAL_TABLET | SUBLINGUAL | Status: DC | PRN
  Administered 2013-06-30 (×2): 0.4 mg via SUBLINGUAL
  Filled 2013-06-30: qty 1

## 2013-06-30 MED ORDER — SODIUM CHLORIDE 0.9 % IV BOLUS (SEPSIS)
1000.0000 mL | Freq: Once | INTRAVENOUS | Status: AC
  Administered 2013-06-30: 1000 mL via INTRAVENOUS

## 2013-06-30 MED ORDER — INSULIN ASPART 100 UNIT/ML ~~LOC~~ SOLN: 0.0000 [IU] | Freq: Three times a day (TID) | SUBCUTANEOUS | Status: DC

## 2013-06-30 MED ORDER — MORPHINE SULFATE 4 MG/ML IJ SOLN
4.0000 mg | INTRAMUSCULAR | Status: DC | PRN
  Administered 2013-06-30: 4 mg via INTRAVENOUS
  Filled 2013-06-30: qty 1

## 2013-06-30 MED ORDER — INSULIN ASPART 100 UNIT/ML ~~LOC~~ SOLN
5.0000 [IU] | Freq: Once | SUBCUTANEOUS | Status: AC
  Administered 2013-06-30: 5 [IU] via SUBCUTANEOUS
  Filled 2013-06-30: qty 1

## 2013-06-30 MED ORDER — INSULIN ASPART 100 UNIT/ML ~~LOC~~ SOLN: 0.0000 [IU] | Freq: Every day | SUBCUTANEOUS | Status: DC

## 2013-06-30 NOTE — ED Notes (Signed)
Pt states he cannot stay in the hospital tonight. His brother is on the kidney transplant list and has to be in [place] in the morning around 0730. States he is concerned about the finding that his ecg has changes on it and that is blood sugars are elevated. But states that he just cant let his brother miss his appt in [place]

## 2013-06-30 NOTE — ED Notes (Signed)
ADMITTING DOCTORS AT BEDSIDE

## 2013-06-30 NOTE — ED Notes (Signed)
AMA FORM SIGNED BY PT. HE DENIES ANY CP OR SOB. VERBALIZES UNDERSTANDING THAT IF NEW OR WORSENING SYMPTOMS TO RETURN TO HOSPITAL IMMEDIATLY

## 2013-06-30 NOTE — H&P (Signed)
Date: 06/30/2013               Patient Name:  [name] MRN: [id number]  DOB: [birth year] Age / Sex: 53 y.o., male   PCP: No primary provider on file.           Medical Service: Internal Medicine Teaching Service         Attending Physician: Dr. Rayne Du att. providers found    First Contact: Dr. Michail Jewels, MD Pager: [phone] (7AM-5PM Mon-Fri)  Second Contact: Dr. Karlyn Agee, MD Pager: [phone]       After Hours (After 5p/  First Contact Pager: [phone]  weekends / holidays): Second Contact Pager: [phone]    Most Recent Discharge Date:  06/30/13  Chief Complaint:  Chief Complaint  Patient presents with  . Chest Pain       History of Present Illness:  [name] is a 53 y.o. male who has a past medical history of uncontrolled diabetes mellitus, hypertension and bipolar/depression.  Pt presents to the ED with chest pain.  Pt reports nonexertional, 9/10 CP that began last PM when he was sitting in his chair.  He describes it as tingling and radiates down both arms.  He endorses associated SOB but denies any N/V, diaphoresis, lightheadedness.  He denies any h/o GERD, heavy lifting, or recent long distance travel.  He denies smoking or any current cocaine use and reports being clean since 2009.  He has not taken his insulin today because he did not feel well but reports compliance with his metformin and BP meds.  He denies any other medical history although he has multiple behavioral health admissions for bipolar disorder.  Upon further questioning, he reports that the tingling in his arms has been going on for some time and is not acute.    In the ED, he reports resolution of his CP with morphine.   He is not willing to be admitted because he has to travel to [place] tomorrow with his brother who is in the process of receiving a kidney transplant.   Meds: No current facility-administered medications for this encounter.   Current Outpatient Prescriptions  Medication Sig Dispense  Refill  . insulin NPH-regular Human (NOVOLIN 70/30) (70-30) 100 UNIT/ML injection Inject 45 Units into the skin 2 (two) times daily with a meal.      . lisinopril (PRINIVIL,ZESTRIL) 20 MG tablet Take 20 mg by mouth daily.      . metFORMIN (GLUCOPHAGE) 1000 MG tablet Take 2,000 mg by mouth daily.         Allergies: Allergies as of 06/30/2013  . (No Known Allergies)    PMH: Past Medical History  Diagnosis Date  . Diabetes mellitus     uncontrolled   . Hypertension     PSH: Past Surgical History  Procedure Laterality Date  . Knee surgery      b/l knees; pending total knee     FH: Family History  Problem Relation Age of Onset  . Diabetes    . Cancer Mother     uterus; deceased   . Hypertension    . Heart attack Father     alive   . Stroke Sister     12/2012 age 36 y.o    SH: History  Substance Use Topics  . Smoking status: Former Research scientist (life sciences)  . Smokeless tobacco: Not on file  . Alcohol Use: No    Review of Systems: Pertinent items are noted in HPI.  Physical  Exam: Filed Vitals:   06/30/13 1607  BP: 147/86  Pulse: 85  Temp: 98.2 F (36.8 C)  Resp: 20    Physical Exam Constitutional: Vital signs reviewed.  Patient is a well-developed and well-nourished male in no acute distress and somewhat cooperative with exam.  Head: Normocephalic and atraumatic Eyes: PERRL, EOMI, conjunctivae normal, no scleral icterus.  Neck: Supple, Trachea midline .  Cardiovascular: RRR, no MRG, pulses symmetric and intact bilaterally Pulmonary/Chest: normal respiratory effort, CTAB, no wheezes, rales, or rhonchi Abdominal: Soft. Non-tender, non-distended, bowel sounds are normal, no masses, organomegaly, or guarding present.  Musculoskeletal: No joint deformities, erythema, or stiffness Neurological: A&O x3, cranial nerve II-XII are grossly intact, no focal motor deficit, sensory intact to light touch bilaterally.  Skin: Warm, dry and intact. No rash, cyanosis, or clubbing.    Psychiatric: Somewhat anxious.   Lab results:  Basic Metabolic Panel:  Recent Labs  06/30/13 1215  NA 133*  K 5.1  CL 98  CO2 25  GLUCOSE 451*  BUN 13  CREATININE 0.82  CALCIUM 9.5   Anion Gap: 10   Calcium/Magnesium/Phosphorus:  Recent Labs Lab 06/30/13 1215  CALCIUM 9.5    Liver Function Tests: No results found for this basename: AST, ALT, ALKPHOS, BILITOT, PROT, ALBUMIN,  in the last 72 hours No results found for this basename: LIPASE, AMYLASE,  in the last 72 hours No results found for this basename: AMMONIA,  in the last 72 hours  CBC: Lab Results  Component Value Date   WBC 2.3* 06/30/2013   HGB 13.6 06/30/2013   HCT 39.3 06/30/2013   MCV 77.5* 06/30/2013   PLT 179 06/30/2013    Lipase: No results found for this basename: LIPASE    Lactic Acid/Procalcitonin: No results found for this basename: LATICACIDVEN, PROCALCITON, O2SATVEN,  in the last 168 hours  Cardiac Enzymes:  Recent Labs  06/30/13 1216  TROPIPOC 0.00   Lab Results  Component Value Date   CKTOTAL 105 01/30/2010   CKMB 2.2 01/30/2010   TROPONINI <0.30 06/30/2013    BNP:  Recent Labs  06/30/13 1215  PROBNP 26.4    D-Dimer: No results found for this basename: DDIMER,  in the last 72 hours  CBG:  Recent Labs  06/30/13 1420  GLUCAP 323*    Hemoglobin A1C: No results found for this basename: HGBA1C,  in the last 72 hours  Lipid Panel: No results found for this basename: CHOL, HDL, LDLCALC, TRIG, CHOLHDL, LDLDIRECT,  in the last 72 hours  Thyroid Function Tests: No results found for this basename: TSH, T4TOTAL, FREET4, T3FREE, THYROIDAB,  in the last 72 hours  Anemia Panel: No results found for this basename: VITAMINB12, FOLATE, FERRITIN, TIBC, IRON, RETICCTPCT,  in the last 72 hours  Coagulation: No results found for this basename: LABPROT, INR,  in the last 72 hours  Urine Drug Screen: Drugs of Abuse:     Component Value Date/Time   LABOPIA POSITIVE* 06/30/2013 1459    COCAINSCRNUR NONE DETECTED 06/30/2013 1459   LABBENZ NONE DETECTED 06/30/2013 1459   AMPHETMU NONE DETECTED 06/30/2013 1459   THCU NONE DETECTED 06/30/2013 1459   LABBARB NONE DETECTED 06/30/2013 1459    Alcohol Level: No results found for this basename: ETH,  in the last 72 hours  Urinalysis:   Recent Labs  06/30/13 1459  COLORURINE YELLOW  LABSPEC 1.034*  PHURINE 6.0  GLUCOSEU >1000*  HGBUR NEGATIVE  BILIRUBINUR NEGATIVE  KETONESUR NEGATIVE  PROTEINUR NEGATIVE  UROBILINOGEN 1.0  NITRITE  NEGATIVE  LEUKOCYTESUR NEGATIVE    Imaging results:  Dg Chest 2 View  06/30/2013   CLINICAL DATA:  Chest pain.  EXAM: CHEST - 2 VIEW  COMPARISON:  01/30/2010  FINDINGS: The heart size and mediastinal contours are within normal limits. Lung volumes are low bilaterally with bibasilar atelectasis present. There is no evidence of pulmonary edema, consolidation, pneumothorax, nodule or pleural fluid. The visualized skeletal structures are unremarkable.  IMPRESSION: Bibasilar atelectasis.  No active disease.   Electronically Signed   By: Aletta Edouard M.D.   On: 06/30/2013 13:01    EKG: EKG Interpretation  Date/Time:  Wednesday Jun 30 2013 11:52:56 EDT Ventricular Rate:  95 PR Interval:  160 QRS Duration: 84 QT Interval:  334 QTC Calculation: 419 R Axis:   57 Text Interpretation:  Normal sinus rhythm Anterior infarct , age undetermined T wave abnormality Lateral leads Abnormal ECG When compared with ECG of 05/31/2013 T wave abnormality is now Present Confirmed by [facility]  MD, Nunzio Cory [phone]) on 06/30/2013 1:11:31 PM   Orders placed during the hospital encounter of 06/30/13  . ED EKG  . ED EKG  . EKG 12-LEAD  . EKG 12-LEAD     Antibiotics: Antibiotics Given (last 72 hours)   None      Anti-infectives   None      SIRS/Sepsis/Septic Shock criteria met: No   Consults:    Assessment & Plan by Problem: Active Problems:   Bipolar affect, depressed   Chest pain   HTN  (hypertension)   Uncontrolled diabetes mellitus   Pt is a 53 y.o.    has a past medical history of Diabetes mellitus and Hypertension. presents to ED with chest pain.   #Atypical Chest Pain -   Pt presents with atypical chest pain that is continuous and relieved by morphine.  He has no significant EKG changes.  However, he has risk factors for ACS including age, DMII, HTN, dyslipidemia, family hx.   ACS should be ruled out.  TIMI score: 0.  Other diagnoses considered include: PE (Wells score: 0, no previous h/o, VS to suggest PE, recent travel hx, malignancy, OCP use), Pneumothorax (no pleuritic CP, SOB, or onset acute), Pneumonia (no acute onset of rigors, fever, cough, pleuritic CP, or leukocytosis), Aortic dissection (pain not described as acute, tearing/ripping, no widening of mediastinum on CXR), Pericarditis (no pleuritic CP, diffuse ST elevation on EKG, or friction rub), GI etiology (denies symptoms of dyspepsia, regurgitation, acid taste, no h/o PPI use), Anxiety (pt has h/o bipolar).   -admit to observation (telemetry) -troponins X3 -O2 -morphine & NTG prn -ASA -lipid panel, HA1c -add statin atorvastatin 66m daily -EKG prn chest pain -CXR -check UDS   #Hypertension -  BP 155/101 upon arrival to ED.  On lisinopril 214mqd at home and reports compliance.  -continue home meds   #Uncontrolled Diabetes Mellitus II -   Pt noncompliant with medications.  Lab Results  Component Value Date   HGBA1C 11.9* 05/31/2013   -d/c metformin -SSI-M, continue NPH 45 units bid  -ac and hs cbg -consult to diabetes education   #Bipolar/depression -  Pt does not report this history even though he has multiple behavioral health records. -Monitor  #Substance abuse -  Pt has a h/o polysubstance, including cocaine.  -monitor  #FEN-  Fluids- Electrolytes-  Hypornatremia - likely d/t elevated serum glucose Nutrition- Carb modified  #Screening -  -check HIV  #VTE prophylaxis -  lovenox  4079mQ qd   #Dispo -  Disposition deferred at this time, awaiting improvement of current medical problems. Anticipated discharge in approximately 1-2 day(s).    Emergency Contact: Contact Information   Name Relation Home Work Mobile   Everly,[name] Brother [phone]        The patient does not have a current PCP (No primary provider on file.) and does need an [facility] hospital follow-up appointment after discharge.  Signed: Jones Bales, MD PGY-1, Internal Medicine Teaching Service [phone] (7AM-5PM Mon-Fri) 06/30/2013, 10:07 PM

## 2013-06-30 NOTE — ED Provider Notes (Signed)
CSN: [id number]     Arrival date & time 06/30/13  1150 History   First MD Initiated Contact with Patient 06/30/13 1248     Chief Complaint  Patient presents with  . Chest Pain      HPI Pt was seen at 1300. Per pt, c/o gradual onset and persistence of constant left sided chest "pain" since "sometime last night." Pt cannot recall when he developed the CP. Describes the CP as "tingling," and radiating into his left shoulder area. States the pain worsens with palpation of the area and movement of his arm. Pt also c/o SOB on exertion for an unknown period of time. Pt has significant hx of DM, with recent ED evaluation for same; endorses compliance with his meds, with his home CBG's "in the 200's." Denies palpitations, no cough, no abd pain, no N/V/D, no neck or back pain, no injury.       No PMD Past Medical History  Diagnosis Date  . Diabetes mellitus   . Hypertension    History reviewed. No pertinent past surgical history.  History  Substance Use Topics  . Smoking status: Former Research scientist (life sciences)  . Smokeless tobacco: Not on file  . Alcohol Use: No    Review of Systems ROS: Statement: All systems negative except as marked or noted in the HPI; Constitutional: Negative for fever and chills. ; ; Eyes: Negative for eye pain, redness and discharge. ; ; ENMT: Negative for ear pain, hoarseness, nasal congestion, sinus pressure and sore throat. ; ; Cardiovascular: +CP, SOB. Negative for palpitations, diaphoresis, and peripheral edema. ; ; Respiratory: Negative for cough, wheezing and stridor. ; ; Gastrointestinal: Negative for nausea, vomiting, diarrhea, abdominal pain, blood in stool, hematemesis, jaundice and rectal bleeding. . ; ; Genitourinary: Negative for dysuria, flank pain and hematuria. ; ; Musculoskeletal: Negative for back pain and neck pain. Negative for swelling and trauma.; ; Skin: Negative for pruritus, rash, abrasions, blisters, bruising and skin lesion.; ; Neuro: Negative for headache,  lightheadedness and neck stiffness. Negative for weakness, altered level of consciousness , altered mental status, extremity weakness, paresthesias, involuntary movement, seizure and syncope.      Allergies  Review of patient's allergies indicates no known allergies.  Home Medications   Prior to Admission medications   Medication Sig Start Date End Date Taking? Authorizing Provider  insulin NPH-regular Human (NOVOLIN 70/30) (70-30) 100 UNIT/ML injection Inject 45 Units into the skin 2 (two) times daily with a meal.   Yes Historical Provider, MD  lisinopril (PRINIVIL,ZESTRIL) 20 MG tablet Take 20 mg by mouth daily.   Yes Historical Provider, MD  metFORMIN (GLUCOPHAGE) 1000 MG tablet Take 2,000 mg by mouth daily.    Yes Historical Provider, MD   BP 150/91  Pulse 96  Temp(Src) 98 F (36.7 C) (Oral)  Resp 20  SpO2 99% Physical Exam 1305: Physical examination:  Nursing notes reviewed; Vital signs and O2 SAT reviewed;  Constitutional: Well developed, Well nourished, Well hydrated, In no acute distress; Head:  Normocephalic, atraumatic; Eyes: EOMI, PERRL, No scleral icterus; ENMT: Mouth and pharynx normal, Mucous membranes moist; Neck: Supple, Full range of motion, No lymphadenopathy; Cardiovascular: Regular rate and rhythm, No murmur, rub, or gallop; Respiratory: Breath sounds clear & equal bilaterally, No rales, rhonchi, wheezes.  Speaking full sentences with ease, Normal respiratory effort/excursion; Chest: +left anterior chest all tender to palp. No rash, no soft tissue crepitus, no deformity. Movement normal; Abdomen: Soft, Nontender, Nondistended, Normal bowel sounds; Genitourinary: No CVA tenderness; Spine:  No  midline CS, TS, LS tenderness. +TTP left hypertonic trapezius muscle.;; Extremities: Pulses normal, No tenderness, No edema, No calf edema or asymmetry.; Neuro: AA&Ox3, Major CN grossly intact.  Speech clear. No gross focal motor or sensory deficits in extremities.; Skin: Color normal,  Warm, Dry.   ED Course  Procedures     EKG Interpretation   Date/Time:  Wednesday Jun 30 2013 11:52:56 EDT Ventricular Rate:  95 PR Interval:  160 QRS Duration: 84 QT Interval:  334 QTC Calculation: 419 R Axis:   57 Text Interpretation:  Normal sinus rhythm Anterior infarct , age  undetermined T wave abnormality Lateral leads Abnormal ECG When compared  with ECG of 05/31/2013 T wave abnormality is now Present Confirmed by  [facility]  MD, Nunzio Cory [phone]) on 06/30/2013 1:11:31 PM      MDM  MDM Reviewed: previous chart, nursing note and vitals Reviewed previous: labs and ECG Interpretation: labs, ECG and x-ray    Results for orders placed during the hospital encounter of 06/30/13  CBC      Result Value Ref Range   WBC 2.3 (*) 4.0 - 10.5 K/uL   RBC 5.07  4.22 - 5.81 MIL/uL   Hemoglobin 13.6  13.0 - 17.0 g/dL   HCT 39.3  39.0 - 52.0 %   MCV 77.5 (*) 78.0 - 100.0 fL   MCH 26.8  26.0 - 34.0 pg   MCHC 34.6  30.0 - 36.0 g/dL   RDW 13.2  11.5 - 15.5 %   Platelets 179  150 - 400 K/uL  BASIC METABOLIC PANEL      Result Value Ref Range   Sodium 133 (*) 137 - 147 mEq/L   Potassium 5.1  3.7 - 5.3 mEq/L   Chloride 98  96 - 112 mEq/L   CO2 25  19 - 32 mEq/L   Glucose, Bld 451 (*) 70 - 99 mg/dL   BUN 13  6 - 23 mg/dL   Creatinine, Ser 0.82  0.50 - 1.35 mg/dL   Calcium 9.5  8.4 - 10.5 mg/dL   GFR calc non Af Amer >90  >90 mL/min   GFR calc Af Amer >90  >90 mL/min  PRO B NATRIURETIC PEPTIDE      Result Value Ref Range   Pro B Natriuretic peptide (BNP) 26.4  0 - 125 pg/mL  I-STAT TROPOININ, ED      Result Value Ref Range   Troponin i, poc 0.00  0.00 - 0.08 ng/mL   Comment 3           CBG MONITORING, ED      Result Value Ref Range   Glucose-Capillary 323 (*) 70 - 99 mg/dL   Dg Chest 2 View 06/30/2013   CLINICAL DATA:  Chest pain.  EXAM: CHEST - 2 VIEW  COMPARISON:  01/30/2010  FINDINGS: The heart size and mediastinal contours are within normal limits. Lung volumes are low  bilaterally with bibasilar atelectasis present. There is no evidence of pulmonary edema, consolidation, pneumothorax, nodule or pleural fluid. The visualized skeletal structures are unremarkable.  IMPRESSION: Bibasilar atelectasis.  No active disease.   Electronically Signed   By: Aletta Edouard M.D.   On: 06/30/2013 13:01    1340:  CBG elevated per hx of DM, not acidotic with AG 10. Will dose IVF and SQ insulin. EKG with new TWA; will dose ASA, SL ntg and IV morphine. Dx and testing d/w pt.  Questions answered.  Verb understanding, agreeable to admit. T/C to [facility] Resident,  case discussed, including:  HPI, pertinent PM/SHx, VS/PE, dx testing, ED course and treatment:  Agreeable to observation admit, requests they will come to the ED for eval.   1545:  Pt has been evaluated by [facility] Residents. Now states he wants to leave. Pt informed re: dx testing results, including persistently elevated CBG, as well as new EKG changes concerning for cardiac insufficiency and that I recommend admission for further evaluation.  Pt refuses admission.  I encouraged pt to stay, continues to refuse.  Pt makes his own medical decisions.  Risks of AMA explained to pt by myself and [facility] Residents, including, but not limited to:  Renal failure, blindness, stroke, heart attack, cardiac arrythmia ("irregular heart rate/beat"), "passing out," temporary and/or permanent disability, death.  Pt verb understanding and continues to refuse admission, understanding the consequences of his decision.  I encouraged pt to follow up with his PMD tomorrow and return to the ED immediately if symptoms return, or for any other concerns.  Pt verb understanding, agreeable.      Alfonzo Feller, DO 07/02/13 2053

## 2013-06-30 NOTE — ED Notes (Signed)
Pt in c/o left sided chest pain and left arm tingling since last night, states pain is constant, also c/o increased shortness of breath with exertion, denies other symptoms, rates pain 8/10 at this time, states no medications PTA.

## 2013-07-01 ENCOUNTER — Emergency Department (HOSPITAL_COMMUNITY)
Admission: EM | Admit: 2013-07-01 | Discharge: 2013-07-01 | Discharge status: Against Medical Advice | Payer: Medicare Other | Attending: Emergency Medicine | Admitting: Emergency Medicine

## 2013-07-01 ENCOUNTER — Encounter (HOSPITAL_COMMUNITY): Payer: Self-pay | Admitting: Emergency Medicine

## 2013-07-01 DIAGNOSIS — E119 Type 2 diabetes mellitus without complications: Secondary | ICD-10-CM | POA: Insufficient documentation

## 2013-07-01 DIAGNOSIS — R079 Chest pain, unspecified: Secondary | ICD-10-CM | POA: Insufficient documentation

## 2013-07-01 DIAGNOSIS — Z87891 Personal history of nicotine dependence: Secondary | ICD-10-CM | POA: Insufficient documentation

## 2013-07-01 DIAGNOSIS — I10 Essential (primary) hypertension: Secondary | ICD-10-CM | POA: Insufficient documentation

## 2013-07-01 LAB — HIV ANTIBODY (ROUTINE TESTING W REFLEX): HIV 1&2 Ab, 4th Generation: NONREACTIVE

## 2013-07-01 LAB — BASIC METABOLIC PANEL
BUN: 13 mg/dL (ref 6–23)
CO2: 24 mEq/L (ref 19–32)
Calcium: 10 mg/dL (ref 8.4–10.5)
Chloride: 99 mEq/L (ref 96–112)
Creatinine, Ser: 0.75 mg/dL (ref 0.50–1.35)
GFR calc Af Amer: >90 mL/min (ref >90)
GFR calc non Af Amer: >90 mL/min (ref >90)
Glucose, Bld: 373 mg/dL — ABNORMAL HIGH (ref 70–99)
Potassium: 4.7 mEq/L (ref 3.7–5.3)
Sodium: 137 mEq/L (ref 137–147)

## 2013-07-01 LAB — URINE CULTURE
Colony Count: NO GROWTH
Culture: NO GROWTH

## 2013-07-01 LAB — CBC
HCT: 40.6 % (ref 39.0–52.0)
Hemoglobin: 13.9 g/dL (ref 13.0–17.0)
MCH: 26.6 pg (ref 26.0–34.0)
MCHC: 34.2 g/dL (ref 30.0–36.0)
MCV: 77.6 fL — ABNORMAL LOW (ref 78.0–100.0)
Platelets: 180 10*3/uL (ref 150–400)
RBC: 5.23 MIL/uL (ref 4.22–5.81)
RDW: 13.3 % (ref 11.5–15.5)
WBC: 3.4 10*3/uL — ABNORMAL LOW (ref 4.0–10.5)

## 2013-07-01 LAB — I-STAT TROPONIN, ED: Troponin i, poc: 0.01 ng/mL (ref 0.00–0.08)

## 2013-07-01 LAB — PRO B NATRIURETIC PEPTIDE: Pro B Natriuretic peptide (BNP): 16.6 pg/mL (ref 0–125)

## 2013-07-01 MED ORDER — OXYCODONE-ACETAMINOPHEN 5-325 MG PO TABS
1.0000 | ORAL_TABLET | Freq: Once | ORAL | Status: AC
  Administered 2013-07-01: 1 via ORAL
  Filled 2013-07-01: qty 1

## 2013-07-01 NOTE — ED Notes (Signed)
Pt states he is leaving due to wait, encouraged to stay

## 2013-07-01 NOTE — ED Notes (Addendum)
Pt reports left sided chest "sharpness" with radiation to left arm x 2 days with SOB. Seen here yesterday for same. Recommended for admission, but pt left AMA. Pt states pain continues, rates 9/10. Denies taking anything pain. NAD.

## 2013-07-02 ENCOUNTER — Encounter (HOSPITAL_COMMUNITY): Payer: Self-pay | Admitting: Emergency Medicine

## 2013-07-02 ENCOUNTER — Emergency Department (HOSPITAL_COMMUNITY)
Admission: EM | Admit: 2013-07-02 | Discharge: 2013-07-02 | Disposition: A | Discharge status: Home / Self Care | Payer: Medicare Other | Attending: Emergency Medicine | Admitting: Emergency Medicine

## 2013-07-02 ENCOUNTER — Emergency Department (HOSPITAL_COMMUNITY): Payer: Medicare Other

## 2013-07-02 DIAGNOSIS — Z794 Long term (current) use of insulin: Secondary | ICD-10-CM | POA: Insufficient documentation

## 2013-07-02 DIAGNOSIS — R079 Chest pain, unspecified: Secondary | ICD-10-CM

## 2013-07-02 DIAGNOSIS — I10 Essential (primary) hypertension: Secondary | ICD-10-CM | POA: Insufficient documentation

## 2013-07-02 DIAGNOSIS — IMO0001 Reserved for inherently not codable concepts without codable children: Secondary | ICD-10-CM | POA: Insufficient documentation

## 2013-07-02 DIAGNOSIS — Z79899 Other long term (current) drug therapy: Secondary | ICD-10-CM | POA: Insufficient documentation

## 2013-07-02 DIAGNOSIS — Z87891 Personal history of nicotine dependence: Secondary | ICD-10-CM | POA: Insufficient documentation

## 2013-07-02 DIAGNOSIS — E1165 Type 2 diabetes mellitus with hyperglycemia: Secondary | ICD-10-CM

## 2013-07-02 LAB — TROPONIN I: Troponin I: <0.3 ng/mL (ref <0.30)

## 2013-07-02 LAB — BASIC METABOLIC PANEL
BUN: 15 mg/dL (ref 6–23)
CO2: 21 mEq/L (ref 19–32)
Calcium: 9.6 mg/dL (ref 8.4–10.5)
Chloride: 97 mEq/L (ref 96–112)
Creatinine, Ser: 1.01 mg/dL (ref 0.50–1.35)
GFR calc Af Amer: >90 mL/min (ref >90)
GFR calc non Af Amer: 84 mL/min — ABNORMAL LOW (ref >90)
Glucose, Bld: 245 mg/dL — ABNORMAL HIGH (ref 70–99)
Potassium: 4.1 mEq/L (ref 3.7–5.3)
Sodium: 135 mEq/L — ABNORMAL LOW (ref 137–147)

## 2013-07-02 LAB — CBC WITH DIFFERENTIAL/PLATELET
Basophils Absolute: 0 10*3/uL (ref 0.0–0.1)
Basophils Relative: 1 % (ref 0–1)
Eosinophils Absolute: 0 10*3/uL (ref 0.0–0.7)
Eosinophils Relative: 1 % (ref 0–5)
HCT: 38.5 % — ABNORMAL LOW (ref 39.0–52.0)
Hemoglobin: 13.2 g/dL (ref 13.0–17.0)
Lymphocytes Relative: 53 % — ABNORMAL HIGH (ref 12–46)
Lymphs Abs: 1.8 10*3/uL (ref 0.7–4.0)
MCH: 26.2 pg (ref 26.0–34.0)
MCHC: 34.3 g/dL (ref 30.0–36.0)
MCV: 76.5 fL — ABNORMAL LOW (ref 78.0–100.0)
Monocytes Absolute: 0.4 10*3/uL (ref 0.1–1.0)
Monocytes Relative: 13 % — ABNORMAL HIGH (ref 3–12)
Neutro Abs: 1.1 10*3/uL — ABNORMAL LOW (ref 1.7–7.7)
Neutrophils Relative %: 32 % — ABNORMAL LOW (ref 43–77)
Platelets: 181 10*3/uL (ref 150–400)
RBC: 5.03 MIL/uL (ref 4.22–5.81)
RDW: 13.3 % (ref 11.5–15.5)
WBC: 3.4 10*3/uL — ABNORMAL LOW (ref 4.0–10.5)

## 2013-07-02 MED ORDER — KETOROLAC TROMETHAMINE 30 MG/ML IJ SOLN
30.0000 mg | Freq: Once | INTRAMUSCULAR | Status: AC
  Administered 2013-07-02: 30 mg via INTRAVENOUS
  Filled 2013-07-02: qty 1

## 2013-07-02 MED ORDER — MELOXICAM 15 MG PO TABS: 15.0000 mg | ORAL_TABLET | Freq: Every day | ORAL | Status: DC

## 2013-07-02 MED ORDER — ASPIRIN 81 MG PO CHEW
324.0000 mg | CHEWABLE_TABLET | Freq: Once | ORAL | Status: AC
  Administered 2013-07-02: 324 mg via ORAL
  Filled 2013-07-02: qty 4

## 2013-07-02 MED ORDER — HYDROCODONE-ACETAMINOPHEN 5-325 MG PO TABS: 1.0000 | ORAL_TABLET | Freq: Four times a day (QID) | ORAL | Status: DC | PRN

## 2013-07-02 MED ORDER — NITROGLYCERIN 0.4 MG SL SUBL: 0.4000 mg | SUBLINGUAL_TABLET | SUBLINGUAL | Status: DC | PRN

## 2013-07-02 NOTE — ED Provider Notes (Signed)
CSN: [id number]     Arrival date & time 07/02/13  2122 History   First MD Initiated Contact with Patient 07/02/13 2132     Chief Complaint  Patient presents with  . Chest Pain  . Shortness of Breath  . Nausea     (Consider location/radiation/quality/duration/timing/severity/associated sxs/prior Treatment) Patient is a 53 y.o. male presenting with chest pain.  Chest Pain  Pt with history of poorly controlled HTN and DM but no known history of CAD reports sharp, left upper chest pain, radiating into L arm, worse with lying back and movement. No change with breathing. This pain started about 24hrs ago. He was seen 2 days ago at [facility] for same, planned admission but left AMA. Seen again for same yesterday and had labs drawn while in triage but did not stay to be seen.   Past Medical History  Diagnosis Date  . Diabetes mellitus     uncontrolled   . Hypertension    Past Surgical History  Procedure Laterality Date  . Knee surgery      b/l knees; pending total knee    Family History  Problem Relation Age of Onset  . Diabetes    . Cancer Mother     uterus; deceased   . Hypertension    . Heart attack Father     alive   . Stroke Sister     12/2012 age 37 y.o   History  Substance Use Topics  . Smoking status: Former Research scientist (life sciences)  . Smokeless tobacco: Not on file  . Alcohol Use: No    Review of Systems  Cardiovascular: Positive for chest pain.   All other systems reviewed and are negative except as noted in HPI.     Allergies  Review of patient's allergies indicates no known allergies.  Home Medications   Prior to Admission medications   Medication Sig Start Date End Date Taking? Authorizing Provider  insulin aspart protamine- aspart (NOVOLOG MIX 70/30) (70-30) 100 UNIT/ML injection Inject 45 Units into the skin 2 (two) times daily with a meal.   Yes Historical Provider, MD  lisinopril (PRINIVIL,ZESTRIL) 20 MG tablet Take 20 mg by mouth daily.   Yes Historical Provider, MD   metFORMIN (GLUCOPHAGE) 1000 MG tablet Take 1,000 mg by mouth 2 (two) times daily with a meal.    Yes Historical Provider, MD   BP 118/76  Pulse 97  Temp(Src) 98.2 F (36.8 C) (Oral)  Resp 20  Ht 5\' 8"  (1.727 m)  Wt 275 lb (124.739 kg)  BMI 41.82 kg/m2  SpO2 100% Physical Exam  Nursing note and vitals reviewed. Constitutional: He is oriented to person, place, and time. He appears well-developed and well-nourished.  HENT:  Head: Normocephalic and atraumatic.  Eyes: EOM are normal. Pupils are equal, round, and reactive to light.  Neck: Normal range of motion. Neck supple.  Cardiovascular: Normal rate, normal heart sounds and intact distal pulses.   Pulmonary/Chest: Effort normal and breath sounds normal. He has no wheezes. He has no rales. He exhibits tenderness.  Abdominal: Bowel sounds are normal. He exhibits no distension. There is no tenderness.  Musculoskeletal: Normal range of motion. He exhibits no edema and no tenderness.  Neurological: He is alert and oriented to person, place, and time. He has normal strength. No cranial nerve deficit or sensory deficit.  Skin: Skin is warm and dry. No rash noted.  Psychiatric: He has a normal mood and affect.    ED Course  Procedures (including critical care time)  Labs Review Labs Reviewed  CBC WITH DIFFERENTIAL - Abnormal; Notable for the following:    WBC 3.4 (*)    HCT 38.5 (*)    MCV 76.5 (*)    Neutrophils Relative % 32 (*)    Neutro Abs 1.1 (*)    Lymphocytes Relative 53 (*)    Monocytes Relative 13 (*)    All other components within normal limits  BASIC METABOLIC PANEL - Abnormal; Notable for the following:    Sodium 135 (*)    Glucose, Bld 245 (*)    GFR calc non Af Amer 84 (*)    All other components within normal limits  TROPONIN I    Imaging Review Dg Chest 2 View  07/02/2013   CLINICAL DATA:  Chest pain and shortness of breath  EXAM: CHEST  2 VIEW  COMPARISON:  06/30/2013  FINDINGS: Borderline cardiomegaly,  stable from prior. Normal upper mediastinal contours. Mild hypoaeration with interstitial crowding. No edema or consolidation. No effusion or pneumothorax.  IMPRESSION: No active cardiopulmonary disease.   Electronically Signed   By: Jorje Guild M.D.   On: 07/02/2013 22:40    Unable to open EKG in MUSE   Date: 07/02/2013  Rate: 95  Rhythm: normal sinus rhythm  QRS Axis: normal  Intervals: normal  ST/T Wave abnormalities: nonspecific T wave changes  Conduction Disutrbances:none  Narrative Interpretation:   Old EKG Reviewed: unchanged    MDM   Final diagnoses:  Chest pain    Reproducible chest pain, ongoing for >48hrs with neg trop x 3 during that time and no ischemic changes on EKG. Pt sleeping on re-eval, reports minimal improvement in pain with Toradol. Note that the patient has previously had care at [facility] in [place] and review of the [place] reveals numerous Rx for hydrocodone through Feb of this year. I have a low suspicion for ACS, do not feel admission would be beneficial given negative workup x 3 days in a row. Plan discharge with PCP and Cards referral.     Mercie Eon. Karle Starch, MD 07/02/13 2257

## 2013-07-02 NOTE — ED Notes (Signed)
Pt states he was watching wrestling last night around 2100 and he started having chest pain with nausea and and left arm tingling,,

## 2013-07-08 NOTE — Discharge Instructions (Signed)
Chest Pain (Nonspecific) °It is often hard to give a specific diagnosis for the cause of chest pain. There is always a chance that your pain could be related to something serious, such as a heart attack or a blood clot in the lungs. You need to follow up with your caregiver for further evaluation. °CAUSES  °· Heartburn. °· Pneumonia or bronchitis. °· Anxiety or stress. °· Inflammation around your heart (pericarditis) or lung (pleuritis or pleurisy). °· A blood clot in the lung. °· A collapsed lung (pneumothorax). It can develop suddenly on its own (spontaneous pneumothorax) or from injury (trauma) to the chest. °· Shingles infection (herpes zoster virus). °The chest wall is composed of bones, muscles, and cartilage. Any of these can be the source of the pain. °· The bones can be bruised by injury. °· The muscles or cartilage can be strained by coughing or overwork. °· The cartilage can be affected by inflammation and become sore (costochondritis). °DIAGNOSIS  °Lab tests or other studies, such as X-rays, electrocardiography, stress testing, or cardiac imaging, may be needed to find the cause of your pain.  °TREATMENT  °· Treatment depends on what may be causing your chest pain. Treatment may include: °· Acid blockers for heartburn. °· Anti-inflammatory medicine. °· Pain medicine for inflammatory conditions. °· Antibiotics if an infection is present. °· You may be advised to change lifestyle habits. This includes stopping smoking and avoiding alcohol, caffeine, and chocolate. °· You may be advised to keep your head raised (elevated) when sleeping. This reduces the chance of acid going backward from your stomach into your esophagus. °· Most of the time, nonspecific chest pain will improve within 2 to 3 days with rest and mild pain medicine. °HOME CARE INSTRUCTIONS  °· If antibiotics were prescribed, take your antibiotics as directed. Finish them even if you start to feel better. °· For the next few days, avoid physical  activities that bring on chest pain. Continue physical activities as directed. °· Do not smoke. °· Avoid drinking alcohol. °· Only take over-the-counter or prescription medicine for pain, discomfort, or fever as directed by your caregiver. °· Follow your caregiver's suggestions for further testing if your chest pain does not go away. °· Keep any follow-up appointments you made. If you do not go to an appointment, you could develop lasting (chronic) problems with pain. If there is any problem keeping an appointment, you must call to reschedule. °SEEK MEDICAL CARE IF:  °· You think you are having problems from the medicine you are taking. Read your medicine instructions carefully. °· Your chest pain does not go away, even after treatment. °· You develop a rash with blisters on your chest. °SEEK IMMEDIATE MEDICAL CARE IF:  °· You have increased chest pain or pain that spreads to your arm, neck, jaw, back, or abdomen. °· You develop shortness of breath, an increasing cough, or you are coughing up blood. °· You have severe back or abdominal pain, feel nauseous, or vomit. °· You develop severe weakness, fainting, or chills. °· You have a fever. °THIS IS AN EMERGENCY. Do not wait to see if the pain will go away. Get medical help at once. Call your local emergency services (911 in [place] Do not drive yourself to the hospital. °MAKE SURE YOU:  °· Understand these instructions. °· Will watch your condition. °· Will get help right away if you are not doing well or get worse. °Document Released: 11/21/2004 Document Revised: 05/06/2011 Document Reviewed: 09/17/2007 °ExitCare® Patient Information ©2014 ExitCare,   LLC. ° ° ° °Emergency Department Resource Guide °1) Find a Doctor and Pay Out of Pocket °Although you won't have to find out who is covered by your insurance plan, it is a good idea to ask around and get recommendations. You will then need to call the office and see if the doctor you have chosen will accept you as a new  patient and what types of options they offer for patients who are self-pay. Some doctors offer discounts or will set up payment plans for their patients who do not have insurance, but you will need to ask so you aren't surprised when you get to your appointment. ° °2) Contact Your Local Health Department °Not all health departments have doctors that can see patients for sick visits, but many do, so it is worth a call to see if yours does. If you don't know where your local health department is, you can check in your phone book. The CDC also has a tool to help you locate your state's health department, and many state websites also have listings of all of their local health departments. ° °3) Find a Walk-in Clinic °If your illness is not likely to be very severe or complicated, you may want to try a walk in clinic. These are popping up all over the country in pharmacies, drugstores, and shopping centers. They're usually staffed by nurse practitioners or physician assistants that have been trained to treat common illnesses and complaints. They're usually fairly quick and inexpensive. However, if you have serious medical issues or chronic medical problems, these are probably not your best option. ° °No Primary Care Doctor: °- Call [facility] at  832-8000 - they can help you locate a primary care doctor that  accepts your insurance, provides certain services, etc. °- Physician Referral Service- 1-800-533-3463 ° °Chronic Pain Problems: °Organization         Address  Phone   Notes  °[facility]  (336) 297-2271 Patients need to be referred by their primary care doctor.  ° °Medication Assistance: °Organization         Address  Phone   Notes  °[place] [facility] [address] °[place], [place] [address] (336) 641-8030 --Must be a resident of [place] °-- Must have NO insurance coverage whatsoever (no Medicaid/ Medicare, etc.) °-- The pt. MUST have a primary  care doctor that directs their care regularly and follows them in the community °  °[facility]  (866) 331-1348   °[employer]  (888) 892-1162   ° °Agencies that provide inexpensive medical care: °Organization         Address  Phone   Notes  °[facility]  (336) 832-8035   °[facility]    (336) 832-7272   °[facility] [address] °[place], [place] [address] (336) 832-4777   °[facility] [address], °[place] (336) 271-4999   °[facility]    (336) 373-0678   °[facility]    (336) 272-1050   °[facility] ° [address], [place] Phone:  (336) 832-4444, Fax:  (336) 832-4440 Hours of Operation:  9 am - 6 pm, M-F.  Also accepts Medicaid/Medicare and self-pay.  °[facility] ° [address], [name] Phone: (336) 832-3150, Fax: (336) 832-3151. Hours of Operation:  8:30 am - 5:30 pm, M-F.  Also accepts Medicaid and self-pay.  °  [facility] [place] [address], [place] Phone: (336) 878-6027   °[facility] [address], [place], [place] (336)723-1848, Ext. 123 Mondays & Thursdays: 7-9 AM.  First 15 patients are seen on a first come, first serve basis. °  ° °Medicaid-accepting [place] Providers: ° °Organization         Address  Phone   Notes  °[facility] [address], [place] (336) 641-2100 Also accepts self-pay patients.  °[facility] [address], [place] ° (336) 856-9996   °[facility] [address], [place] (336) 288-8857   °[facility] [address] [place] [address], [place] (336) 299-7000   °Veita Bland [address], [place]  ° (336) 373-1557 Only accepts [place] Access Medicaid patients after they have their name applied to their card.  ° °Self-Pay (no insurance) in [place]: ° °Organization         Address  Phone   Notes  °Sickle Cell Patients, [facility] [address], [place] (336) 832-1970   °[facility] [address], [place] (336) 832-4400   °[facility] ° [address], [place] (336) 992-4800   °[facility]/Dr. Osei-Bonsu ° [address] [place] [address], [place] or [address], [place] (336) 841-8500 Phone number for both [place] and [place] locations is the same.  °[facility] [address], [place] (336) 299-0000   °[facility] [address] [place] [address], [place] or [address] (336) 852-7530 °(336) 878-2260   °[facility] [address], [place] (336) 350-1642, phone; (336) 294-5005, fax Sees patients 1st and 3rd Saturday of every month.  Must not qualify for public or private insurance (i.e. Medicaid, Medicare, [place] Health Choice, Veterans' Benefits) • Household income should be no more than 200% of the poverty level •The clinic cannot treat you if you are pregnant or think you are pregnant • Sexually transmitted diseases are not treated at the clinic.  ° ° °Dental Care: °Organization         Address  Phone  Notes  °[facility] [address], [place] (336) 641-6152 Accepts children up to age 21 who are enrolled in Medicaid or [place] Health Choice; pregnant women with a Medicaid card; and children who have applied for Medicaid or [place] Health Choice, but were declined, whose parents can pay a reduced fee at time of service.  °[facility] [place]  [address], [place] (336) 641-7733 Accepts children up to age 21 who are enrolled in Medicaid or [place] Health Choice; pregnant women with a Medicaid card; and children who have applied for Medicaid or  Health Choice, but were declined, whose parents can  pay a reduced fee at time of service.  °[facility] ° [address], [place] (336) 641-4533 Patients are seen by appointment only. Walk-ins are not accepted. [facility] will see patients 18 years of age and older. °Monday - Tuesday (8am-5pm) °Most Wednesdays (8:30-5pm) °$30 per visit, cash only  °[facility] ° [address], [place] (336) 641-4533 Patients are seen by appointment only. Walk-ins are not accepted. [facility] will see patients 18 years of   age and older. °One Wednesday Evening (Monthly: Volunteer Based).  $30 per visit, cash only  °[facility]  (919) 537-3737 for adults; Children under age 4, call Graduate Pediatric Dentistry at (919) [phone]. Children aged 4-14, please call (919) [phone] to request a pediatric application. ° Dental services are provided in all areas of dental care including fillings, crowns and bridges, complete and partial dentures, implants, gum treatment, root canals, and extractions. Preventive care is also provided. Treatment is provided to both adults and children. °Patients are selected via a lottery and there is often a waiting list. °  °[facility] [address], °[place] ° (336) 763-8833 [email or web address] °  °[facility] [address], [place], [place] (336)723-1848, Ext. 123 Second and Fourth Thursday of each month, opens at 6:30 AM; Clinic ends at 9 AM.  Patients are seen on a first-come first-served basis, and a limited number are seen during each clinic.  ° °[facility] ° [address], [place], [place] (336) 723-7904   Eligibility Requirements °You must have lived in [place], [place], or [place] counties for at least the last three months. °  You cannot be eligible for state or federal sponsored healthcare insurance, including Veterans Administration, Medicaid, or Medicare. °  You generally cannot be eligible for healthcare  insurance through your employer.  °  How to apply: °Eligibility screenings are held every Tuesday and Wednesday afternoon from 1:00 pm until 4:00 pm. You do not need an appointment for the interview!  °[facility] [address], [place], [place] 336-631-2330   °[facility]  336-342-8273   °[facility]  336-703-3100   °[facility]  336-570-6415   ° °Behavioral Health Resources in the Community: °Intensive Outpatient Programs °Organization         Address  Phone  Notes  °[facility] [address], [place], [place] 336-878-6098   °[facility] [address], [place], [place] 336-832-9800   °[facility] [address], [place], [place] ° 336-882-2125   °[facility] [address],  °[place], [place] 1-800-853-5163 or 336-641-4981   °Substance Abuse Resources °Organization         Address  Phone  Notes  °[facility]  336-882-2125   °[facility]  336-784-9470   °[facility]  336-285-9073   °[facility]  336-845-3988   °Residential & Outpatient Substance Abuse Program  1-800-659-3381   °Psychological Services °Organization         Address  Phone  Notes  °[facility]  336- 832-9600   °[facility]  336- 378-7881   °[facility] [address], [place] 1-800-853-5163 or 336-641-4981   ° °Mobile Crisis Teams °Organization         Address  Phone  Notes  °[facility], Mobile Crisis Care Unit  1-877-626-1772   °Assertive °[facility] ° [address] [place], [place] 336-834-9664   °Sharon DeEsch [address] °[place] 336-554-5454   ° °Self-Help/Support Groups °Organization         Address  Phone             Notes  °[facility]  - variety of support groups  336- 373-1402 Call for more information  °Narcotics Anonymous (NA),  [facility] [address], °[place]   2   meetings at this location  ° °Residential Treatment Programs °Organization         Address  Phone  Notes  °[facility] [address],    °[place]  1-866-801-8205   °[facility] ° [address], [place], [place] 704-293-8524   °[facility] [address], [place] 336-845-3988 Admissions: 8am-3pm M-F  °[facility] [address]    °[place], [place] 336-841-1104   °[facility] [address], [place], [place] 336-379-7146   °[facility] [address]  °[place], [place] 336-285-9073   °[facility] Intensive Outpatient [address], [place], [place] 336-852-3033   °[facility] [address]  °[place], [place] 1-877-615-2722 or 336-784-9470   °[facility] ([facility] [address] [place], [place] 336-227-7417 Accepts Medicaid  °[facility] [address]  °[place] 1-800-659-3381 Substance Abuse/Addiction Treatment  ° °[place] Behavioral Health Resources °Organization         Address  Phone  Notes  °[facility]  (888) 581-9988   °Julie Brannon, PhD [address] [place], [place]   (336) 349-5553 or (336) 951-0000   °[facility]   [address] °[place], [place] (336) 349-4454   °[facility] [address], [place], [place] (336) 342-8316 Insurance/Medicaid/sponsorship through [facility]  °[facility] [address]                                    [place], [place] (336) 342-8316 Therapy/tele-psych/case  °[facility] [address]  ° [place], [place] (336) 349-2233    °Dr. Arfeen  (336) 349-4544   °[facility] [place]  [employer] [place] [facility] 1) [address], [place] °2) [address], [place] °3)  [address], [place] (336) 349-3220 °(336) 342-7768 ° °(336) 342-8140    °[place] [facility] (336) 342-1394 or (336) 342-3537 (After Hours)    ° ° ° °

## 2013-07-12 ENCOUNTER — Encounter (HOSPITAL_COMMUNITY): Payer: Self-pay | Admitting: Emergency Medicine

## 2013-07-12 ENCOUNTER — Emergency Department (HOSPITAL_COMMUNITY)
Admission: EM | Admit: 2013-07-12 | Discharge: 2013-07-12 | Disposition: A | Discharge status: Home / Self Care | Payer: Medicare Other | Attending: Emergency Medicine | Admitting: Emergency Medicine

## 2013-07-12 DIAGNOSIS — Y929 Unspecified place or not applicable: Secondary | ICD-10-CM | POA: Insufficient documentation

## 2013-07-12 DIAGNOSIS — M549 Dorsalgia, unspecified: Secondary | ICD-10-CM

## 2013-07-12 DIAGNOSIS — X500XXA Overexertion from strenuous movement or load, initial encounter: Secondary | ICD-10-CM | POA: Insufficient documentation

## 2013-07-12 DIAGNOSIS — Z794 Long term (current) use of insulin: Secondary | ICD-10-CM | POA: Insufficient documentation

## 2013-07-12 DIAGNOSIS — I10 Essential (primary) hypertension: Secondary | ICD-10-CM | POA: Insufficient documentation

## 2013-07-12 DIAGNOSIS — Y9389 Activity, other specified: Secondary | ICD-10-CM | POA: Insufficient documentation

## 2013-07-12 DIAGNOSIS — Z791 Long term (current) use of non-steroidal anti-inflammatories (NSAID): Secondary | ICD-10-CM | POA: Insufficient documentation

## 2013-07-12 DIAGNOSIS — IMO0002 Reserved for concepts with insufficient information to code with codable children: Secondary | ICD-10-CM | POA: Insufficient documentation

## 2013-07-12 DIAGNOSIS — Z87891 Personal history of nicotine dependence: Secondary | ICD-10-CM | POA: Insufficient documentation

## 2013-07-12 DIAGNOSIS — IMO0001 Reserved for inherently not codable concepts without codable children: Secondary | ICD-10-CM | POA: Insufficient documentation

## 2013-07-12 DIAGNOSIS — Z79899 Other long term (current) drug therapy: Secondary | ICD-10-CM | POA: Insufficient documentation

## 2013-07-12 DIAGNOSIS — E1165 Type 2 diabetes mellitus with hyperglycemia: Secondary | ICD-10-CM

## 2013-07-12 MED ORDER — HYDROMORPHONE HCL PF 1 MG/ML IJ SOLN
1.0000 mg | Freq: Once | INTRAMUSCULAR | Status: AC
  Administered 2013-07-12: 1 mg via INTRAMUSCULAR
  Filled 2013-07-12: qty 1

## 2013-07-12 MED ORDER — DIAZEPAM 5 MG PO TABS: 5.0000 mg | ORAL_TABLET | Freq: Three times a day (TID) | ORAL | Status: DC | PRN

## 2013-07-12 MED ORDER — DEXAMETHASONE 4 MG PO TABS
8.0000 mg | ORAL_TABLET | Freq: Once | ORAL | Status: AC
  Administered 2013-07-12: 8 mg via ORAL
  Filled 2013-07-12: qty 2

## 2013-07-12 MED ORDER — KETOROLAC TROMETHAMINE 30 MG/ML IJ SOLN
30.0000 mg | Freq: Once | INTRAMUSCULAR | Status: AC
  Administered 2013-07-12: 30 mg via INTRAMUSCULAR
  Filled 2013-07-12: qty 1

## 2013-07-12 MED ORDER — DIAZEPAM 5 MG PO TABS
5.0000 mg | ORAL_TABLET | Freq: Once | ORAL | Status: AC
  Administered 2013-07-12: 5 mg via ORAL
  Filled 2013-07-12: qty 1

## 2013-07-12 MED ORDER — OXYCODONE-ACETAMINOPHEN 5-325 MG PO TABS: 1.0000 | ORAL_TABLET | ORAL | Status: DC | PRN

## 2013-07-12 NOTE — ED Notes (Signed)
Pt made aware that he needs to find a ride home due to medication he has been given. Pt verbalized understanding and sts. He will call his brother to pick him up.

## 2013-07-12 NOTE — ED Notes (Signed)
Per pt, was getting up from chair last night and felt a "pop".  States unable to walk today without cane and very difficult.

## 2013-07-12 NOTE — Discharge Instructions (Signed)
Lumbosacral Strain Lumbosacral strain is a strain of any of the parts that make up your lumbosacral vertebrae. Your lumbosacral vertebrae are the bones that make up the lower third of your backbone. Your lumbosacral vertebrae are held together by muscles and tough, fibrous tissue (ligaments).  CAUSES  A sudden blow to your back can cause lumbosacral strain. Also, anything that causes an excessive stretch of the muscles in the low back can cause this strain. This is typically seen when people exert themselves strenuously, fall, lift heavy objects, bend, or crouch repeatedly. RISK FACTORS  Physically demanding work.  Participation in pushing or pulling sports or sports that require sudden twist of the back (tennis, golf, baseball).  Weight lifting.  Excessive lower back curvature.  Forward-tilted pelvis.  Weak back or abdominal muscles or both.  Tight hamstrings. SIGNS AND SYMPTOMS  Lumbosacral strain may cause pain in the area of your injury or pain that moves (radiates) down your leg.  DIAGNOSIS Your health care provider can often diagnose lumbosacral strain through a physical exam. In some cases, you may need tests such as X-ray exams.  TREATMENT  Treatment for your lower back injury depends on many factors that your clinician will have to evaluate. However, most treatment will include the use of anti-inflammatory medicines. HOME CARE INSTRUCTIONS   Avoid hard physical activities (tennis, racquetball, waterskiing) if you are not in proper physical condition for it. This may aggravate or create problems.  If you have a back problem, avoid sports requiring sudden body movements. Swimming and walking are generally safer activities.  Maintain good posture.  Maintain a healthy weight.  For acute conditions, you may put ice on the injured area.  Put ice in a plastic bag.  Place a towel between your skin and the bag.  Leave the ice on for 20 minutes, 2 3 times a day.  When the  low back starts healing, stretching and strengthening exercises may be recommended. SEEK MEDICAL CARE IF:  Your back pain is getting worse.  You experience severe back pain not relieved with medicines. SEEK IMMEDIATE MEDICAL CARE IF:   You have numbness, tingling, weakness, or problems with the use of your arms or legs.  There is a change in bowel or bladder control.  You have increasing pain in any area of the body, including your belly (abdomen).  You notice shortness of breath, dizziness, or feel faint.  You feel sick to your stomach (nauseous), are throwing up (vomiting), or become sweaty.  You notice discoloration of your toes or legs, or your feet get very cold. MAKE SURE YOU:   Understand these instructions.  Will watch your condition.  Will get help right away if you are not doing well or get worse. Document Released: 11/21/2004 Document Revised: 12/02/2012 Document Reviewed: 09/30/2012 ExitCare Patient Information 2014 [employer].  

## 2013-07-12 NOTE — ED Provider Notes (Signed)
CSN: [id number]     Arrival date & time 07/12/13  1215 History   First MD Initiated Contact with Patient 07/12/13 1309     Chief Complaint  Patient presents with  . Back Pain     (Consider location/radiation/quality/duration/timing/severity/associated sxs/prior Treatment) HPI  53 year old male with back pain. Pain is in the left lower back. Onset last night as she was getting up from a chair. She was doing this, he felt a "pop" in this region. He's been having persistent back pain since then. It is worse with movement. No fevers or chills. Recent blood thinning medication. No numbness or tingling. No urinary complaints or urinary or fecal continence/retention. Denies or vomiting. Denies any history of past back surgery. Denies any IV drug use.  Past Medical History  Diagnosis Date  . Diabetes mellitus     uncontrolled   . Hypertension    Past Surgical History  Procedure Laterality Date  . Knee surgery      b/l knees; pending total knee    Family History  Problem Relation Age of Onset  . Diabetes    . Cancer Mother     uterus; deceased   . Hypertension    . Heart attack Father     alive   . Stroke Sister     12/2012 age 78 y.o   History  Substance Use Topics  . Smoking status: Former Research scientist (life sciences)  . Smokeless tobacco: Not on file  . Alcohol Use: No    Review of Systems  All systems reviewed and negative, other than as noted in HPI.   Allergies  No known allergies  Home Medications   Prior to Admission medications   Medication Sig Start Date End Date Taking? Authorizing Provider  HYDROcodone-acetaminophen (NORCO/VICODIN) 5-325 MG per tablet Take 1-2 tablets by mouth every 6 (six) hours as needed. 07/02/13  Yes Charles B. Karle Starch, MD  insulin aspart protamine- aspart (NOVOLOG MIX 70/30) (70-30) 100 UNIT/ML injection Inject 45 Units into the skin 2 (two) times daily with a meal.   Yes Historical Provider, MD  lisinopril (PRINIVIL,ZESTRIL) 20 MG tablet Take 20 mg by mouth  daily.   Yes Historical Provider, MD  meloxicam (MOBIC) 15 MG tablet Take 1 tablet (15 mg total) by mouth daily. 07/02/13  Yes Charles B. Karle Starch, MD  metFORMIN (GLUCOPHAGE) 1000 MG tablet Take 1,000 mg by mouth 2 (two) times daily with a meal.    Yes Historical Provider, MD   BP 127/78  Pulse 92  Temp(Src) 98.9 F (37.2 C) (Oral)  Resp 19  SpO2 99% Physical Exam  Nursing note and vitals reviewed. Constitutional: He appears well-developed and well-nourished. No distress.  HENT:  Head: Normocephalic and atraumatic.  Eyes: Conjunctivae are normal. Right eye exhibits no discharge. Left eye exhibits no discharge.  Neck: Neck supple.  Cardiovascular: Normal rate, regular rhythm and normal heart sounds.  Exam reveals no gallop and no friction rub.   No murmur heard. Pulmonary/Chest: Effort normal and breath sounds normal. No respiratory distress.  Abdominal: Soft. He exhibits no distension. There is no tenderness.  Musculoskeletal: He exhibits tenderness. He exhibits no edema.  Tenderness in the lumbar paraspinal region left. No midline spinal tenderness. No concerning overlying skin changes.  Neurological: He is alert.  Strength is 5 out of 5 bilateral upper and lower extremities. Sensation is intact to light touch. Patellar reflexes are one plus bilaterally.  Skin: Skin is warm and dry.  Psychiatric: He has a normal mood and affect. His  behavior is normal. Thought content normal.    ED Course  Procedures (including critical care time) Labs Review Labs Reviewed - No data to display  Imaging Review No results found.   EKG Interpretation None      MDM   Final diagnoses:  Back pain    53 year old male with back pain. with back pain. Atraumatic. Non focal neuro exam. No blood thinning medications. No bladder/bowel incontinence or retention. Denies hx of IV drug use. Doubt cauda equina, spinal epidural abscess, spinal epidural hematoma, fracture, vertebral osteomyelitis/discitis  or other potential emergent etiology. No indication for emergent imaging.  Plan symptomatic tx. Return precautions discussed. Outpt FU otherwise.    Virgel Manifold, MD 07/17/13 1126

## 2013-08-28 ENCOUNTER — Encounter (HOSPITAL_COMMUNITY): Payer: Self-pay | Admitting: Emergency Medicine

## 2013-08-28 ENCOUNTER — Encounter (HOSPITAL_COMMUNITY): Payer: Self-pay | Admitting: *Deleted

## 2013-08-28 ENCOUNTER — Inpatient Hospital Stay (HOSPITAL_COMMUNITY)
Admission: AD | Admit: 2013-08-28 | Discharge: 2013-08-31 | DRG: 885 | Disposition: A | Discharge status: Home / Self Care | Payer: Medicare Other | Source: Intra-hospital | Attending: Psychiatry | Admitting: Psychiatry

## 2013-08-28 ENCOUNTER — Emergency Department (HOSPITAL_COMMUNITY)
Admission: EM | Admit: 2013-08-28 | Discharge: 2013-08-28 | Disposition: A | Discharge status: Other Acute Inpatient Hospital | Payer: Medicare Other | Source: Home / Self Care | Attending: Emergency Medicine | Admitting: Emergency Medicine

## 2013-08-28 DIAGNOSIS — R45851 Suicidal ideations: Secondary | ICD-10-CM

## 2013-08-28 DIAGNOSIS — I10 Essential (primary) hypertension: Secondary | ICD-10-CM | POA: Diagnosis present

## 2013-08-28 DIAGNOSIS — Z794 Long term (current) use of insulin: Secondary | ICD-10-CM | POA: Diagnosis not present

## 2013-08-28 DIAGNOSIS — F333 Major depressive disorder, recurrent, severe with psychotic symptoms: Secondary | ICD-10-CM | POA: Diagnosis present

## 2013-08-28 DIAGNOSIS — R440 Auditory hallucinations: Secondary | ICD-10-CM

## 2013-08-28 DIAGNOSIS — F329 Major depressive disorder, single episode, unspecified: Secondary | ICD-10-CM

## 2013-08-28 DIAGNOSIS — Z823 Family history of stroke: Secondary | ICD-10-CM | POA: Diagnosis not present

## 2013-08-28 DIAGNOSIS — Z87891 Personal history of nicotine dependence: Secondary | ICD-10-CM | POA: Diagnosis not present

## 2013-08-28 DIAGNOSIS — G4700 Insomnia, unspecified: Secondary | ICD-10-CM | POA: Diagnosis present

## 2013-08-28 DIAGNOSIS — Z833 Family history of diabetes mellitus: Secondary | ICD-10-CM

## 2013-08-28 DIAGNOSIS — Z8249 Family history of ischemic heart disease and other diseases of the circulatory system: Secondary | ICD-10-CM | POA: Diagnosis not present

## 2013-08-28 DIAGNOSIS — F32A Depression, unspecified: Secondary | ICD-10-CM

## 2013-08-28 DIAGNOSIS — E119 Type 2 diabetes mellitus without complications: Secondary | ICD-10-CM | POA: Diagnosis present

## 2013-08-28 DIAGNOSIS — F332 Major depressive disorder, recurrent severe without psychotic features: Secondary | ICD-10-CM | POA: Diagnosis present

## 2013-08-28 DIAGNOSIS — F319 Bipolar disorder, unspecified: Secondary | ICD-10-CM | POA: Diagnosis present

## 2013-08-28 DIAGNOSIS — F1420 Cocaine dependence, uncomplicated: Secondary | ICD-10-CM | POA: Diagnosis present

## 2013-08-28 HISTORY — DX: Suicidal ideations: R45.851

## 2013-08-28 HISTORY — DX: Depression, unspecified: F32.A

## 2013-08-28 HISTORY — DX: Major depressive disorder, single episode, unspecified: F32.9

## 2013-08-28 HISTORY — DX: Bipolar disorder, unspecified: F31.9

## 2013-08-28 LAB — URINE MICROSCOPIC-ADD ON

## 2013-08-28 LAB — CBC WITH DIFFERENTIAL/PLATELET
Basophils Absolute: 0 10*3/uL (ref 0.0–0.1)
Basophils Relative: 0 % (ref 0–1)
Eosinophils Absolute: 0 10*3/uL (ref 0.0–0.7)
Eosinophils Relative: 1 % (ref 0–5)
HCT: 41.7 % (ref 39.0–52.0)
Hemoglobin: 13.7 g/dL (ref 13.0–17.0)
Lymphocytes Relative: 49 % — ABNORMAL HIGH (ref 12–46)
Lymphs Abs: 1.7 10*3/uL (ref 0.7–4.0)
MCH: 25.8 pg — ABNORMAL LOW (ref 26.0–34.0)
MCHC: 32.9 g/dL (ref 30.0–36.0)
MCV: 78.7 fL (ref 78.0–100.0)
Monocytes Absolute: 0.4 10*3/uL (ref 0.1–1.0)
Monocytes Relative: 12 % (ref 3–12)
Neutro Abs: 1.4 10*3/uL — ABNORMAL LOW (ref 1.7–7.7)
Neutrophils Relative %: 38 % — ABNORMAL LOW (ref 43–77)
Platelets: 182 10*3/uL (ref 150–400)
RBC: 5.3 MIL/uL (ref 4.22–5.81)
RDW: 13.4 % (ref 11.5–15.5)
WBC: 3.5 10*3/uL — ABNORMAL LOW (ref 4.0–10.5)

## 2013-08-28 LAB — COMPREHENSIVE METABOLIC PANEL
ALT: 14 U/L (ref 0–53)
AST: 17 U/L (ref 0–37)
Albumin: 4 g/dL (ref 3.5–5.2)
Alkaline Phosphatase: 70 U/L (ref 39–117)
Anion gap: 13 (ref 5–15)
BUN: 12 mg/dL (ref 6–23)
CO2: 26 mEq/L (ref 19–32)
Calcium: 9.5 mg/dL (ref 8.4–10.5)
Chloride: 94 mEq/L — ABNORMAL LOW (ref 96–112)
Creatinine, Ser: 0.75 mg/dL (ref 0.50–1.35)
GFR calc Af Amer: >90 mL/min (ref >90)
GFR calc non Af Amer: >90 mL/min (ref >90)
Glucose, Bld: 349 mg/dL — ABNORMAL HIGH (ref 70–99)
Potassium: 4.6 mEq/L (ref 3.7–5.3)
Sodium: 133 mEq/L — ABNORMAL LOW (ref 137–147)
Total Bilirubin: 0.5 mg/dL (ref 0.3–1.2)
Total Protein: 7.8 g/dL (ref 6.0–8.3)

## 2013-08-28 LAB — CBG MONITORING, ED
Glucose-Capillary: 313 mg/dL — ABNORMAL HIGH (ref 70–99)
Glucose-Capillary: 301 mg/dL — ABNORMAL HIGH (ref 70–99)

## 2013-08-28 LAB — URINALYSIS, ROUTINE W REFLEX MICROSCOPIC
Bilirubin Urine: NEGATIVE
Glucose, UA: >1000 mg/dL — AB
Hgb urine dipstick: NEGATIVE
Ketones, ur: 15 mg/dL — AB
Leukocytes, UA: NEGATIVE
Nitrite: NEGATIVE
Protein, ur: 30 mg/dL — AB
Specific Gravity, Urine: 1.039 — ABNORMAL HIGH (ref 1.005–1.030)
Urobilinogen, UA: 1 mg/dL (ref 0.0–1.0)
pH: 5 (ref 5.0–8.0)

## 2013-08-28 LAB — RAPID URINE DRUG SCREEN, HOSP PERFORMED
Amphetamines: NOT DETECTED
Barbiturates: NOT DETECTED
Benzodiazepines: NOT DETECTED
Cocaine: POSITIVE — AB
Opiates: NOT DETECTED
Tetrahydrocannabinol: NOT DETECTED

## 2013-08-28 LAB — ETHANOL: Alcohol, Ethyl (B): <11 mg/dL (ref 0–11)

## 2013-08-28 LAB — GLUCOSE, CAPILLARY: Glucose-Capillary: 492 mg/dL — ABNORMAL HIGH (ref 70–99)

## 2013-08-28 MED ORDER — MAGNESIUM HYDROXIDE 400 MG/5ML PO SUSP: 30.0000 mL | Freq: Every day | ORAL | Status: DC | PRN

## 2013-08-28 MED ORDER — INSULIN ASPART 100 UNIT/ML ~~LOC~~ SOLN: 0.0000 [IU] | Freq: Every day | SUBCUTANEOUS | Status: DC

## 2013-08-28 MED ORDER — HYDROCODONE-ACETAMINOPHEN 5-325 MG PO TABS: 1.0000 | ORAL_TABLET | Freq: Four times a day (QID) | ORAL | Status: DC | PRN

## 2013-08-28 MED ORDER — DIAZEPAM 5 MG PO TABS: 5.0000 mg | ORAL_TABLET | Freq: Three times a day (TID) | ORAL | Status: DC | PRN

## 2013-08-28 MED ORDER — ALUM & MAG HYDROXIDE-SIMETH 200-200-20 MG/5ML PO SUSP: 30.0000 mL | ORAL | Status: DC | PRN

## 2013-08-28 MED ORDER — INSULIN ASPART PROT & ASPART (70-30 MIX) 100 UNIT/ML ~~LOC~~ SUSP
45.0000 [IU] | Freq: Two times a day (BID) | SUBCUTANEOUS | Status: DC
  Filled 2013-08-28: qty 10

## 2013-08-28 MED ORDER — ACETAMINOPHEN 325 MG PO TABS: 650.0000 mg | ORAL_TABLET | Freq: Four times a day (QID) | ORAL | Status: DC | PRN

## 2013-08-28 MED ORDER — LISINOPRIL 20 MG PO TABS
20.0000 mg | ORAL_TABLET | Freq: Every day | ORAL | Status: DC
  Administered 2013-08-29 – 2013-08-31 (×3): 20 mg via ORAL
  Filled 2013-08-28 (×5): qty 1

## 2013-08-28 MED ORDER — INSULIN ASPART 100 UNIT/ML ~~LOC~~ SOLN
0.0000 [IU] | Freq: Every day | SUBCUTANEOUS | Status: DC
  Administered 2013-08-28: 6 [IU] via SUBCUTANEOUS

## 2013-08-28 MED ORDER — INSULIN ASPART 100 UNIT/ML ~~LOC~~ SOLN: 0.0000 [IU] | Freq: Three times a day (TID) | SUBCUTANEOUS | Status: DC

## 2013-08-28 MED ORDER — INSULIN ASPART 100 UNIT/ML ~~LOC~~ SOLN
4.0000 [IU] | Freq: Once | SUBCUTANEOUS | Status: AC
  Administered 2013-08-28: 4 [IU] via SUBCUTANEOUS
  Filled 2013-08-28: qty 1

## 2013-08-28 MED ORDER — MELOXICAM 15 MG PO TABS: 15.0000 mg | ORAL_TABLET | Freq: Every day | ORAL | Status: DC

## 2013-08-28 MED ORDER — METFORMIN HCL 500 MG PO TABS
1000.0000 mg | ORAL_TABLET | Freq: Two times a day (BID) | ORAL | Status: DC
  Filled 2013-08-28 (×2): qty 2

## 2013-08-28 MED ORDER — LISINOPRIL 20 MG PO TABS
20.0000 mg | ORAL_TABLET | Freq: Every day | ORAL | Status: DC
  Administered 2013-08-28: 20 mg via ORAL
  Filled 2013-08-28: qty 1

## 2013-08-28 MED ORDER — INSULIN ASPART PROT & ASPART (70-30 MIX) 100 UNIT/ML ~~LOC~~ SUSP
45.0000 [IU] | Freq: Two times a day (BID) | SUBCUTANEOUS | Status: DC
  Administered 2013-08-28 – 2013-08-29 (×2): 45 [IU] via SUBCUTANEOUS

## 2013-08-28 MED ORDER — METFORMIN HCL 500 MG PO TABS
1000.0000 mg | ORAL_TABLET | Freq: Two times a day (BID) | ORAL | Status: DC
  Administered 2013-08-29 – 2013-08-31 (×5): 1000 mg via ORAL
  Filled 2013-08-28 (×9): qty 2

## 2013-08-28 NOTE — BH Assessment (Signed)
Tele Assessment Note   [name] is an 53 y.o. male presenting to ED with increasing depression, command hallucinations to kill himself, and suicidal ideation without a current specific plan. Pt unable to contract for safety. Pt states that last night he tried to overdose on cocaine to kill himself. Pt denies HI and VH. Pt started to feel very depressed in the last 2-3 days, and then felt extremely depressed yesterday when he tried to kill himself. Pt unable to identify a particular stressor. Pt states "I want help to figure out what's going on. I want a way to shut these voices down."  Pt states he has heard voices since he was in his late teens. Pt states voices are a constant, whether he is depressed or not. His most recent prior suicide attempt was in 2013 when he put a gun in his mouth and the gun misfired. He states he has tried to kill himself multiple other times, but cannot remember how many times. He states he is impulsive, and has jumped off a school building, stood in front of a train, and attempted to overdose. Pt has history of cocaine abuse. At his height of use, he would use 300-400 dollars of cocaine per day. Pt states he has not used for 3 years, until last night in overdose attempt. Pt has been hospitalized once before, at [facility] in 2013. Per pt, he received outpatient treatment in [place], and was given dx of chronic depression and bipolar disorder. Pt states the only psych med he can remember being prescribed is zoloft.  Pt lives by himself in a house. He receives disability. He grew up in [place], and recently moved back to the area from [place] for 11 years, and has been living her for six months. 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  Recommend for inpatient treatment.  Axis I: Bipolar, Depressed Axis II: Deferred Axis III:  Past Medical History  Diagnosis Date  . Diabetes mellitus     uncontrolled   . Hypertension   .  Bipolar affective   . Depression    Axis IV: problems related to social environment and problems with primary support group Axis V: 21-30 behavior considerably influenced by delusions or hallucinations OR serious impairment in judgment, communication OR inability to function in almost all areas  Past Medical History:  Past Medical History  Diagnosis Date  . Diabetes mellitus     uncontrolled   . Hypertension   . Bipolar affective   . Depression     Past Surgical History  Procedure Laterality Date  . Knee surgery      b/l knees; pending total knee     Family History:  Family History  Problem Relation Age of Onset  . Diabetes    . Cancer Mother     uterus; deceased   . Hypertension    . Heart attack Father     alive   . Stroke Sister     12/2012 age 16 y.o    Social History:  reports that he has quit smoking. He does not have any smokeless tobacco history on file. He reports that he does not drink alcohol or use illicit drugs.  Additional Social History:     CIWA: CIWA-Ar BP: 181/96 mmHg Pulse Rate: 94 COWS:    Allergies:  Allergies  Allergen Reactions  . Other Anaphylaxis    Allergy to GRITS , unknown exactly the specific ingredient he is  allergic too    Home Medications:  (Not in a hospital admission)  OB/GYN Status:  No LMP for male patient.  General Assessment Data Location of Assessment: [facility] ED Is this a Tele or Face-to-Face Assessment?: Face-to-Face Is this an Initial Assessment or a Re-assessment for this encounter?: Initial Assessment Living Arrangements: Alone Can pt return to current living arrangement?: Yes Admission Status: Voluntary Is patient capable of signing voluntary admission?: Yes     [facility] Crisis Care Plan Living Arrangements: Alone     Risk to self Suicidal Ideation: Yes-Currently Present Is patient at risk for suicide?: Yes Suicidal Plan?: Yes-Currently Present Specify Current Suicidal Plan:  (to OD on cocaine) Access to  Means: Yes Specify Access to Suicidal Means:  (has access to cocaine) What has been your use of drugs/alcohol within the last 12 months?:  (overdose attempt on cocaine with this suicide attempt) Previous Attempts/Gestures: Yes How many times?:  (at least twice) Intentional Self Injurious Behavior: None Family Suicide History: Unknown Substance abuse history and/or treatment for substance abuse?: Yes  Risk to Others Homicidal Ideation: No  Psychosis Hallucinations: Auditory  Mental Status Report Appear/Hygiene: In scrubs Eye Contact: Good Motor Activity: Unremarkable Level of Consciousness: Alert Mood: Depressed  Cognitive Functioning Concentration: Normal IQ: Average     Prior Inpatient Therapy Prior Inpatient Therapy: Yes Prior Therapy Dates: 2013 Prior Therapy Facilty/Provider(s):  ([facility])  Prior Outpatient Therapy Prior Therapy Dates: 5-6 years ago Prior Therapy Facilty/Provider(s): [facility] Reason for Treatment:  (Bipolar/Depression)            Values / Beliefs Cultural Requests During Hospitalization: None Spiritual Requests During Hospitalization: None              Disposition:  Disposition Initial Assessment Completed for this Encounter: Yes Disposition of Patient: Inpatient treatment program Type of inpatient treatment program: Adult  [name] 08/28/2013 2:22 PM

## 2013-08-28 NOTE — ED Provider Notes (Signed)
CSN: [id number]     Arrival date & time 08/28/13  1227 History   First MD Initiated Contact with Patient 08/28/13 1229     No chief complaint on file.    (Consider location/radiation/quality/duration/timing/severity/associated sxs/prior Treatment) HPI Comments: Presented for evaluation of depression and hearing voices. Patient reports that he has been hearing voices for the last several days. He has been receiving command hallucinations telling him to kill himself. Patient admits to previous psychiatric admission, but currently is not on any psychiatric medications.   Past Medical History  Diagnosis Date  . Diabetes mellitus     uncontrolled   . Hypertension    Past Surgical History  Procedure Laterality Date  . Knee surgery      b/l knees; pending total knee    Family History  Problem Relation Age of Onset  . Diabetes    . Cancer Mother     uterus; deceased   . Hypertension    . Heart attack Father     alive   . Stroke Sister     12/2012 age 28 y.o   History  Substance Use Topics  . Smoking status: Former Research scientist (life sciences)  . Smokeless tobacco: Not on file  . Alcohol Use: No    Review of Systems  Psychiatric/Behavioral: Positive for suicidal ideas and dysphoric mood.  All other systems reviewed and are negative.     Allergies  No known allergies  Home Medications   Prior to Admission medications   Medication Sig Start Date End Date Taking? Authorizing Provider  diazepam (VALIUM) 5 MG tablet Take 1 tablet (5 mg total) by mouth every 8 (eight) hours as needed for anxiety or muscle spasms. 07/12/13   Virgel Manifold, MD  HYDROcodone-acetaminophen (NORCO/VICODIN) 5-325 MG per tablet Take 1-2 tablets by mouth every 6 (six) hours as needed. 07/02/13   Charles B. Karle Starch, MD  insulin aspart protamine- aspart (NOVOLOG MIX 70/30) (70-30) 100 UNIT/ML injection Inject 45 Units into the skin 2 (two) times daily with a meal.    Historical Provider, MD  lisinopril (PRINIVIL,ZESTRIL) 20 MG  tablet Take 20 mg by mouth daily.    Historical Provider, MD  meloxicam (MOBIC) 15 MG tablet Take 1 tablet (15 mg total) by mouth daily. 07/02/13   Charles B. Karle Starch, MD  metFORMIN (GLUCOPHAGE) 1000 MG tablet Take 1,000 mg by mouth 2 (two) times daily with a meal.     Historical Provider, MD  oxyCODONE-acetaminophen (PERCOCET/ROXICET) 5-325 MG per tablet Take 1-2 tablets by mouth every 4 (four) hours as needed for moderate pain or severe pain. 07/12/13   Virgel Manifold, MD   BP 181/96  Pulse 94  Temp(Src) 98.2 F (36.8 C) (Oral)  Resp 16  SpO2 99% Physical Exam  Constitutional: He is oriented to person, place, and time. He appears well-developed and well-nourished. No distress.  HENT:  Head: Normocephalic and atraumatic.  Right Ear: Hearing normal.  Left Ear: Hearing normal.  Nose: Nose normal.  Mouth/Throat: Oropharynx is clear and moist and mucous membranes are normal.  Eyes: Conjunctivae and EOM are normal. Pupils are equal, round, and reactive to light.  Neck: Normal range of motion. Neck supple.  Cardiovascular: Regular rhythm, S1 normal and S2 normal.  Exam reveals no gallop and no friction rub.   No murmur heard. Pulmonary/Chest: Effort normal and breath sounds normal. No respiratory distress. He exhibits no tenderness.  Abdominal: Soft. Normal appearance and bowel sounds are normal. There is no hepatosplenomegaly. There is no tenderness. There is  no rebound, no guarding, no tenderness at McBurney's point and negative Murphy's sign. No hernia.  Musculoskeletal: Normal range of motion.  Neurological: He is alert and oriented to person, place, and time. He has normal strength. No cranial nerve deficit or sensory deficit. Coordination normal. GCS eye subscore is 4. GCS verbal subscore is 5. GCS motor subscore is 6.  Skin: Skin is warm, dry and intact. No rash noted. No cyanosis.  Psychiatric: His speech is normal. He exhibits a depressed mood. He expresses suicidal ideation.    ED  Course  Procedures (including critical care time) Labs Review Labs Reviewed  CBG MONITORING, ED - Abnormal; Notable for the following:    Glucose-Capillary 313 (*)    All other components within normal limits  CBC WITH DIFFERENTIAL  COMPREHENSIVE METABOLIC PANEL  URINALYSIS, ROUTINE W REFLEX MICROSCOPIC  URINE RAPID DRUG SCREEN (HOSP PERFORMED)  ETHANOL  CBG MONITORING, ED    Imaging Review No results found.   EKG Interpretation None      MDM   Final diagnoses:  None  Depression Auditory Hallucinations  Presents to the ER for evaluation of depression and suicidality. Patient admits to auditory hallucinations. He is not currently on any medication. He is here voluntarily, asking for help with his depression. Will receive behavioral health evaluation.    Orpah Greek, MD 08/28/13 1247

## 2013-08-28 NOTE — ED Notes (Signed)
Social work at bedside.  

## 2013-08-28 NOTE — Progress Notes (Signed)
Patient ID: [name], male   DOB: 05/11/1960, 53 y.o.   MRN: [id number] Pt is a voluntary and came in due to recently hearing voices that are telling him to hurt himself. Pt stated he has also become increasingly depressed over the last several week. Pt denies SI/HI/AVH. Pt states he is just here for medication management and has not been any psychiatric medications. Pt was pleasant and cooperative.

## 2013-08-28 NOTE — ED Notes (Signed)
[name] CSW into see

## 2013-08-28 NOTE — ED Notes (Signed)
Lunch given (sandwich/diet soda)

## 2013-08-28 NOTE — Progress Notes (Signed)
D.  Pt pleasant on approach, not pleased about being here.  Pt stated that he had thought he was coming over to sign up for outpatient and for medication management because he had not been on any medications.  Pt denied suicidal ideation and is unhappy with lack of snacks diabetic appropriate.  Pt is cooperative.  A.  Support and encouragement offered  R.  Pt remains safe on unit, will continue to monitor.

## 2013-08-28 NOTE — ED Notes (Signed)
Pt ambulatory to [facility] w/ [facility], belongings sent w/ driver.

## 2013-08-28 NOTE — ED Notes (Signed)
Patient has on blue scrubs and yellow socks.  Patient has been wanded by security.  Patient has one personal belonging bag

## 2013-08-28 NOTE — ED Notes (Signed)
[facility] contacted for transport 

## 2013-08-28 NOTE — ED Notes (Signed)
Pt reports hearing voices telling him that "I am useless" and to hurt self. Pt reports hx of depression and bipolar. Pt reports hearing voices before over year ago, tried to shoot self at time. PT calm and cooperative

## 2013-08-28 NOTE — BHH Counselor (Addendum)
Per Letitia Libra, [facility], pt has been accepted to bed 301-2. Alex CSW to do support paperwork.  Arnold Long, [place] Assessment Counselor

## 2013-08-28 NOTE — Tx Team (Signed)
Initial Interdisciplinary Treatment Plan  PATIENT STRENGTHS: (choose at least two) Average or above average intelligence Communication skills Motivation for treatment/growth  PATIENT STRESSORS: No stressors   PROBLEM LIST: Problem List/Patient Goals Date to be addressed Date deferred Reason deferred Estimated date of resolution  AH 08/28/13     Medication managment 08/28/13     Depression 08/28/13                                          DISCHARGE CRITERIA:  Ability to meet basic life and health needs Improved stabilization in mood, thinking, and/or behavior Motivation to continue treatment in a less acute level of care  PRELIMINARY DISCHARGE PLAN: Attend aftercare/continuing care group Return to previous living arrangement  PATIENT/FAMIILY INVOLVEMENT: This treatment plan has been presented to and reviewed with the patient, [name], and/or family member.  The patient and family have been given the opportunity to ask questions and make suggestions.  Beverly Sessions Violon 08/28/2013, 6:31 PM

## 2013-08-28 NOTE — Progress Notes (Signed)
CSW faxed voluntary paperwork to [facility] and placed on chart.  Rochele Pages,     ED CSW  phone: [phone] 3:28pm

## 2013-08-29 ENCOUNTER — Encounter (HOSPITAL_COMMUNITY): Payer: Self-pay | Admitting: Psychiatry

## 2013-08-29 DIAGNOSIS — F1910 Other psychoactive substance abuse, uncomplicated: Secondary | ICD-10-CM

## 2013-08-29 DIAGNOSIS — F333 Major depressive disorder, recurrent, severe with psychotic symptoms: Secondary | ICD-10-CM | POA: Diagnosis present

## 2013-08-29 DIAGNOSIS — R45851 Suicidal ideations: Secondary | ICD-10-CM

## 2013-08-29 LAB — GLUCOSE, CAPILLARY
Glucose-Capillary: 192 mg/dL — ABNORMAL HIGH (ref 70–99)
Glucose-Capillary: 303 mg/dL — ABNORMAL HIGH (ref 70–99)
Glucose-Capillary: 355 mg/dL — ABNORMAL HIGH (ref 70–99)
Glucose-Capillary: 224 mg/dL — ABNORMAL HIGH (ref 70–99)

## 2013-08-29 MED ORDER — ARIPIPRAZOLE 2 MG PO TABS
2.0000 mg | ORAL_TABLET | Freq: Every day | ORAL | Status: DC
  Administered 2013-08-29 – 2013-08-31 (×3): 2 mg via ORAL
  Filled 2013-08-29 (×3): qty 1
  Filled 2013-08-29: qty 7
  Filled 2013-08-29: qty 1

## 2013-08-29 MED ORDER — INSULIN ASPART PROT & ASPART (70-30 MIX) 100 UNIT/ML ~~LOC~~ SUSP
50.0000 [IU] | Freq: Two times a day (BID) | SUBCUTANEOUS | Status: DC
Start: 2013-08-29 — End: 2013-08-31
  Administered 2013-08-29 – 2013-08-30 (×2): 50 [IU] via SUBCUTANEOUS

## 2013-08-29 NOTE — BHH Group Notes (Signed)
[place] Group Notes:  Healthy coping skills  Date:  08/29/2013  Time:  2:11 PM  Type of Therapy:  Nurse Education  Participation Level:  Active  Participation Quality:  Appropriate  Affect:  Appropriate  Cognitive:  Appropriate  Insight:  Appropriate  Engagement in Group:  Engaged  Modes of Intervention:  Discussion  Summary of Progress/Problems:Pt did attend group  Marcello Moores [facility] 08/29/2013, 2:11 PM

## 2013-08-29 NOTE — Progress Notes (Signed)
Adult Psychoeducational Group Note  Date:  08/29/2013 Time:  10:03 PM  Group Topic/Focus:  Wrap-Up Group:   The focus of this group is to help patients review their daily goal of treatment and discuss progress on daily workbooks.  Participation Level:  Did Not Attend  Participation Quality:  Patient did not attend wrap-up group  Affect:  Patient did not attend wrap-up group  Cognitive:  Patient did not attend wrap up group  Insight: None  Engagement in Group: None    Modes of Intervention:  Patient did not attend wrap up group  Additional Comments: Patient did not attend wrap up group for the evening.   Juluis Rainier Lyn 08/29/2013, 10:03 PM

## 2013-08-29 NOTE — Progress Notes (Addendum)
Pt was upset that he is not on his right insulin that he take sat home. He has been refusing the sliding scale even though his sugars are elevated. MD made aware pt s sugar was 303 at lunchtime and pt refused coverage. Dr. Sabra Heck agreed to place pt on the insulin regime he takes at home. Pt stated he denies SI and HI and contracts for safety. He rates his depression a 5/10 and his hopelessness a 1/10. Discussed with pt his elevated sugar at 5pm the patient stated,"Lady I have been doing this with my sugars since the 80's and I know it will come down to where it needs to be." pt fsbs at 5pm was 355.

## 2013-08-29 NOTE — BHH Group Notes (Signed)
[place] Group Notes:  (Nursing/MHT/Case Management/Adjunct)  Date:  08/29/2013  Time:  0900  Type of Therapy:  Self-Inventory Nursing Group  Participation Level:  Did Not Attend    Catalina Gravel 08/29/2013, 10:42 AM

## 2013-08-29 NOTE — BHH Suicide Risk Assessment (Signed)
Suicide Risk Assessment  Admission Assessment     Nursing information obtained from:  Patient Demographic factors:  NA Current Mental Status:  NA Loss Factors:  NA Historical Factors:  Prior suicide attempts Risk Reduction Factors:  Sense of responsibility to family;Positive social support Total Time spent with patient: 45 minutes  CLINICAL FACTORS:   Depression:   Severe  PsyCOGNITIVE FEATURES THAT CONTRIBUTE TO RISK:  Closed-mindedness Polarized thinking Thought constriction (tunnel vision)    SUICIDE RISK:   Moderate:  Frequent suicidal ideation with limited intensity, and duration, some specificity in terms of plans, no associated intent, good self-control, limited dysphoria/symptomatology, some risk factors present, and identifiable protective factors, including available and accessible social support.  PLAN OF CARE: Supportive approach/coping skills/relapse prevention                               CBT;mindfulness                               Assess for psychotropic medications  I certify that inpatient services furnished can reasonably be expected to improve the patient's condition.  Olimpia Tinch A 08/29/2013, 4:09 PM

## 2013-08-29 NOTE — BHH Group Notes (Signed)
[place] [facility] Group Notes: (Clinical Social Work)   08/29/2013      Type of Therapy:  Group Therapy   Participation Level:  Did Not Attend    Selmer Dominion, LCSW 08/29/2013, 12:56 PM

## 2013-08-29 NOTE — Progress Notes (Signed)
Pt attended group this afternoon. Pt participate in group with peers.
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
## 2013-08-29 NOTE — Progress Notes (Signed)
D.  Pt pleasant on approach, denies complaints at this time.  Pt denies need for any medication.  Pt did not attend evening AA group, minimal interaction on unit.  Pt feels that he would be better served by outpatient treatment.  Pt adamantly denies SI/HI/hallucinations at this time.   A.  Support and encouragement offered  R.  Pt remains safe on unit, will continue to monitor.

## 2013-08-30 DIAGNOSIS — F1994 Other psychoactive substance use, unspecified with psychoactive substance-induced mood disorder: Secondary | ICD-10-CM

## 2013-08-30 LAB — GLUCOSE, CAPILLARY
Glucose-Capillary: 331 mg/dL — ABNORMAL HIGH (ref 70–99)
Glucose-Capillary: 203 mg/dL — ABNORMAL HIGH (ref 70–99)
Glucose-Capillary: 255 mg/dL — ABNORMAL HIGH (ref 70–99)
Glucose-Capillary: 293 mg/dL — ABNORMAL HIGH (ref 70–99)
Glucose-Capillary: 156 mg/dL — ABNORMAL HIGH (ref 70–99)

## 2013-08-30 NOTE — BHH Suicide Risk Assessment (Signed)
[place] INPATIENT:  Family/Significant Other Suicide Prevention Education  Suicide Prevention Education:  Patient Refusal for Family/Significant Other Suicide Prevention Education: The patient [name] has refused to provide written consent for family/significant other to be provided Family/Significant Other Suicide Prevention Education during admission and/or prior to discharge.  Physician notified.  SPE completed with pt. SPI pamphlet provided to pt and he was encouraged to share information with support network, ask questions, and talk about any concerns relating to SPE.   Smart, Kamalani Mastro LCSWA  08/30/2013, 3:45 PM

## 2013-08-30 NOTE — BHH Group Notes (Signed)
[facility] LCSW Group Therapy  08/30/2013 3:41 PM  Type of Therapy:  Group Therapy  Participation Level:  None  Participation Quality:  Inattentive  Affect:  Irritable  Cognitive:  Oriented  Insight:  Limited  Engagement in Therapy:  None  Modes of Intervention:  Confrontation, Discussion, Education, Exploration, Problem-solving, Rapport Building, Socialization and Support  Summary of Progress/Problems: Today's Topic: Overcoming Obstacles. Pt identified obstacles faced currently and processed barriers involved in overcoming these obstacles. Pt identified steps necessary for overcoming these obstacles and explored motivation (internal and external) for facing these difficulties head on. Pt further identified one area of concern in their lives and chose a skill of focus pulled from their "toolbox." Pt stated that he wanted to d/c asap and did not want to discuss obstacles or aftercare. Pt irritable during group. He stated that he has safe place to go and plans to return home when he leaves [facility]. Pt did not participate in further group discussion and was inattentive throughout group but remained in room. He shows no progress in the group setting or insight at this time.    Smart, Cam Harnden LCSWA  08/30/2013, 3:41 PM

## 2013-08-30 NOTE — BHH Counselor (Signed)
Adult Comprehensive Assessment  Patient ID: [name], male   DOB: March 18, 1960, 53 y.o.   MRN: [id number]  Information Source: Patient refused to complete PSA with CSW. He was demanding to d/c today. MD notified. Pt to d/c tomorrow AM. Pt irritable and irate during meeting with CSW. He refused all followup. Car across the street at [place]. Pt plans to walk to car at d/c and will drive himself home. CSW to check in with pt tomorrow AM prior to d/c to verify that pt is still refusing all followup.       Smart, [place] LCSWA 08/30/2013

## 2013-08-30 NOTE — Progress Notes (Signed)
[facility] MD Progress Note  08/30/2013 5:25 PM [name]  MRN:  [id number] Subjective:  States that he might have over reacted when he came here. States he is going to ask his PCP to help him get medications for depression. Sates he is very active with NA. States his brother is getting ready to celebrate 63 years of sobriety in the [facility]. He admits he is uncomfortable here. He is used to his routine. If he departs from that routine he gets to feel very irritated and that has been going on here. He is willing to stay maybe one more day to see how Abilify agrees with him but otherwise will like to leave tommorrow Diagnosis:   DSM5: Schizophrenia Disorders:  none Obsessive-Compulsive Disorders:  none Trauma-Stressor Disorders:  none Substance/Addictive Disorders:  None currently  Depressive Disorders:  Major Depressive Disorder - with Psychotic Features (296.24) Total Time spent with patient: 30 minutes  Axis I: Substance Induced Mood Disorder  ADL's:  Intact  Sleep: Fair  Appetite:  Fair  Suicidal Ideation:  Plan:  denies Intent:  denies Means:  denies Homicidal Ideation:  Plan:  denies Intent:  denies Means:  denies AEB (as evidenced by):  Psychiatric Specialty Exam: Physical Exam  Review of Systems  Constitutional: Negative.   HENT: Negative.   Eyes: Negative.   Respiratory: Negative.   Cardiovascular: Negative.   Gastrointestinal: Negative.   Genitourinary: Negative.   Musculoskeletal: Negative.   Skin: Negative.   Neurological: Negative.   Endo/Heme/Allergies: Negative.   Psychiatric/Behavioral: Positive for depression and substance abuse. The patient is nervous/anxious.     Blood pressure 135/94, pulse 96, temperature 96.8 F (36 C), temperature source Oral, resp. rate 18, height 5\' 8"  (1.727 m), weight 121.11 kg (267 lb).Body mass index is 40.61 kg/(m^2).  General Appearance: Fairly Groomed  Engineer, water::  Fair  Speech:  Clear and Coherent  Volume:  Normal   Mood:  Anxious and Irritable  Affect:  Appropriate  Thought Process:  Coherent and Goal Directed  Orientation:  Full (Time, Place, and Person)  Thought Content:  symptoms worries concerns  Suicidal Thoughts:  No  Homicidal Thoughts:  No  Memory:  Immediate;   Fair Recent;   Fair Remote;   Fair  Judgement:  Fair  Insight:  Present  Psychomotor Activity:  Restlessness  Concentration:  Fair  Recall:  [employer] of Knowledge:NA  Language: Fair  Akathisia:  No  Handed:    AIMS (if indicated):     Assets:  Desire for Improvement Housing Social Support  Sleep:  Number of Hours: 6.75   Musculoskeletal: Strength & Muscle Tone: within normal limits Gait & Station: normal Patient leans: N/A  Current Medications: Current Facility-Administered Medications  Medication Dose Route Frequency Provider Last Rate Last Dose  . acetaminophen (TYLENOL) tablet 650 mg  650 mg Oral Q6H PRN [name], NP      . alum & mag hydroxide-simeth (MAALOX/MYLANTA) 200-200-20 MG/5ML suspension 30 mL  30 mL Oral Q4H PRN [name], NP      . ARIPiprazole (ABILIFY) tablet 2 mg  2 mg Oral Daily Nicholaus Bloom, MD   2 mg at 08/30/13 0907  . insulin aspart protamine- aspart (NOVOLOG MIX 70/30) injection 50 Units  50 Units Subcutaneous BID WC Nicholaus Bloom, MD   50 Units at 08/30/13 1708  . lisinopril (PRINIVIL,ZESTRIL) tablet 20 mg  20 mg Oral Daily [name], NP   20 mg at 08/30/13 0907  . magnesium  hydroxide (MILK OF MAGNESIA) suspension 30 mL  30 mL Oral Daily PRN [name], NP      . metFORMIN (GLUCOPHAGE) tablet 1,000 mg  1,000 mg Oral BID WC [name], NP   1,000 mg at 08/30/13 1710    Lab Results:  Results for orders placed during the hospital encounter of 08/28/13 (from the past 48 hour(s))  GLUCOSE, CAPILLARY     Status: Abnormal   Collection Time    08/28/13  5:41 PM      Result Value Ref Range   Glucose-Capillary 331 (*) 70 - 99 mg/dL  GLUCOSE, CAPILLARY     Status: Abnormal    Collection Time    08/28/13  8:41 PM      Result Value Ref Range   Glucose-Capillary 492 (*) 70 - 99 mg/dL   Comment 1 Notify RN    GLUCOSE, CAPILLARY     Status: Abnormal   Collection Time    08/29/13  6:13 AM      Result Value Ref Range   Glucose-Capillary 192 (*) 70 - 99 mg/dL  GLUCOSE, CAPILLARY     Status: Abnormal   Collection Time    08/29/13 12:00 PM      Result Value Ref Range   Glucose-Capillary 303 (*) 70 - 99 mg/dL  GLUCOSE, CAPILLARY     Status: Abnormal   Collection Time    08/29/13  5:05 PM      Result Value Ref Range   Glucose-Capillary 355 (*) 70 - 99 mg/dL  GLUCOSE, CAPILLARY     Status: Abnormal   Collection Time    08/29/13  9:18 PM      Result Value Ref Range   Glucose-Capillary 224 (*) 70 - 99 mg/dL   Comment 1 Notify RN    GLUCOSE, CAPILLARY     Status: Abnormal   Collection Time    08/30/13  6:36 AM      Result Value Ref Range   Glucose-Capillary 203 (*) 70 - 99 mg/dL  GLUCOSE, CAPILLARY     Status: Abnormal   Collection Time    08/30/13 12:03 PM      Result Value Ref Range   Glucose-Capillary 255 (*) 70 - 99 mg/dL  GLUCOSE, CAPILLARY     Status: Abnormal   Collection Time    08/30/13  5:07 PM      Result Value Ref Range   Glucose-Capillary 293 (*) 70 - 99 mg/dL    Physical Findings: AIMS: Facial and Oral Movements Muscles of Facial Expression: None, normal Lips and Perioral Area: None, normal Jaw: None, normal Tongue: None, normal,Extremity Movements Upper (arms, wrists, hands, fingers): None, normal Lower (legs, knees, ankles, toes): None, normal, Trunk Movements Neck, shoulders, hips: None, normal, Overall Severity Severity of abnormal movements (highest score from questions above): None, normal Incapacitation due to abnormal movements: None, normal Patient's awareness of abnormal movements (rate only patient's report): No Awareness, Dental Status Current problems with teeth and/or dentures?: No Does patient usually wear dentures?: No   CIWA:  CIWA-Ar Total: 0 COWS:     Treatment Plan Summary: Daily contact with patient to assess and evaluate symptoms and progress in treatment Medication management  Plan: Supportive approach/coping skills           Evaluate response to Abilify  Medical Decision Making Problem Points:  Review of psycho-social stressors (1) Data Points:  Review of medication regiment & side effects (2)  I certify that inpatient services furnished can reasonably be expected  to improve the patient's condition.   Lanise Mergen A 08/30/2013, 5:25 PM

## 2013-08-30 NOTE — Progress Notes (Signed)
Adult Psychoeducational Group Note  Date:  08/30/2013 Time:  10:00 am  Group Topic/Focus:  Wellness Toolbox:   The focus of this group is to discuss various aspects of wellness, balancing those aspects and exploring ways to increase the ability to experience wellness.  Patients will create a wellness toolbox for use upon discharge.  Participation Level:  Active  Participation Quality:  Appropriate, Sharing and Supportive  Affect:  Appropriate  Cognitive:  Appropriate  Insight: Appropriate  Engagement in Group:  Engaged  Modes of Intervention:  Discussion, Education, Socialization and Support  Additional Comments:  Pt stated that he can start to exercise and be more open minded to different things in his life to assist him with living a more healthy lifestyle. Pt stated that the barriers to his progress are isolation and his selfishness.   Tirza Senteno 08/30/2013, 11:09 AM

## 2013-08-30 NOTE — Progress Notes (Signed)
Patient ID: [name], male   DOB: Jul 24, 1960, 53 y.o.   MRN: [id number] PER STATE REGULATIONS 482.30  THIS CHART WAS REVIEWED FOR MEDICAL NECESSITY WITH RESPECT TO THE PATIENT'S ADMISSION/ DURATION OF STAY.  NEXT REVIEW DATE: 09/01/2013  Chauncy Lean, RN, BSN CASE MANAGER

## 2013-08-30 NOTE — BHH Group Notes (Signed)
[facility] LCSW Aftercare Discharge Planning Group Note   08/30/2013 9:59 AM  Participation Quality:  DID NOT ATTEND-pt in room resting.   Smart, [employer]

## 2013-08-30 NOTE — Progress Notes (Signed)
D) Pt. Refused 0800 insulin.  Pt. Reportedly came to med room early and when staff was not reportedly available, pt. Went back to bed.  A)  Pt. Was encouraged several times to come to medication room and pt. Continued to stay in the bed.  When pt. Was informed by RN that medications would be brought to him, patient refused insulin, but took other am medications.  Pt. Was presented with insulin and encouraged by 2 RN's to take it, but refused saying "it's too late, I'm just gonna take it tonight like I'm supposed to". Blood sugar this am was reported as 203 by night shift RN, and pt. Received his scheduled dose of Metphormin 1000 mg. Pt. Reports no discomfort and does not appear to be in any distress. R) Pt. Remains in the bed at this time. Pt. Is refusing to go to group.  Pt. Denies pain.

## 2013-08-31 DIAGNOSIS — F1410 Cocaine abuse, uncomplicated: Secondary | ICD-10-CM

## 2013-08-31 DIAGNOSIS — F333 Major depressive disorder, recurrent, severe with psychotic symptoms: Principal | ICD-10-CM

## 2013-08-31 LAB — GLUCOSE, CAPILLARY
Glucose-Capillary: 185 mg/dL — ABNORMAL HIGH (ref 70–99)
Glucose-Capillary: 209 mg/dL — ABNORMAL HIGH (ref 70–99)

## 2013-08-31 MED ORDER — METFORMIN HCL 1000 MG PO TABS: 1000.0000 mg | ORAL_TABLET | Freq: Two times a day (BID) | ORAL | Status: DC

## 2013-08-31 MED ORDER — INSULIN ASPART PROT & ASPART (70-30 MIX) 100 UNIT/ML ~~LOC~~ SUSP: 50.0000 [IU] | Freq: Two times a day (BID) | SUBCUTANEOUS | Status: DC

## 2013-08-31 MED ORDER — CLONIDINE HCL 0.2 MG PO TABS
0.2000 mg | ORAL_TABLET | Freq: Once | ORAL | Status: DC
  Filled 2013-08-31: qty 1

## 2013-08-31 MED ORDER — ARIPIPRAZOLE 2 MG PO TABS: 2.0000 mg | ORAL_TABLET | Freq: Every day | ORAL | Status: DC

## 2013-08-31 MED ORDER — LISINOPRIL 20 MG PO TABS: 20.0000 mg | ORAL_TABLET | Freq: Every day | ORAL | Status: DC

## 2013-08-31 MED ORDER — CLONIDINE HCL 0.1 MG PO TABS
ORAL_TABLET | ORAL | Status: AC
  Filled 2013-08-31: qty 2

## 2013-08-31 NOTE — Progress Notes (Addendum)
Patient ID: [name], male   DOB: 08/18/1960, 53 y.o.   MRN: [id number] He has been up and to part of the groups interacting with peers and staff. He refused his 70.30 insulin this AM said that he takes it before he eats and it iwas scheduled for 8 am.  Stated that he knows when and how to take his medications. He also refused a one time dose of clonidine 0.2 mg for  B/P of 136/94 p 80 sitting and standing of 143/98 p 91 said he did not need it.

## 2013-08-31 NOTE — Progress Notes (Signed)
[facility] Adult Case Management Discharge Plan :  Will you be returning to the same living situation after discharge: Yes,  home At discharge, do you have transportation home?:Yes,  family member Do you have the ability to pay for your medications:Yes,  Medicare  Release of information consent forms completed and submitted to Medical Records by CSW.  Patient to Follow up at: Follow-up Information   Follow up with Patient refusing follow-up. .      Patient denies SI/HI:   Yes,  during self report.     Safety Planning and Suicide Prevention discussed:  Yes,  Pt refused to consent to family contact. SPE completed with pt and he was encouraged to share information with support network, ask questions, and talk about any concerns relating to SPE.  Smart, Nathifa Ritthaler LCSWA  08/31/2013, 10:35 AM

## 2013-08-31 NOTE — BHH Suicide Risk Assessment (Signed)
Suicide Risk Assessment  Discharge Assessment     Demographic Factors:  Male  Total Time spent with patient: 45 minutes  Psychiatric Specialty Exam:     Blood pressure 142/92, pulse 91, temperature 97.7 F (36.5 C), temperature source Oral, resp. rate 16, height 5\' 8"  (1.727 m), weight 121.11 kg (267 lb).Body mass index is 40.61 kg/(m^2).  General Appearance: Fairly Groomed  Engineer, water::  Fair  Speech:  Clear and Coherent  Volume:  Normal  Mood:  Anxious and "ready to go home"  Affect:  Appropriate  Thought Process:  Coherent and Goal Directed  Orientation:  Full (Time, Place, and Person)  Thought Content:  plans as he moves on  Suicidal Thoughts:  No  Homicidal Thoughts:  No  Memory:  Immediate;   Fair Recent;   Fair Remote;   Fair  Judgement:  Fair  Insight:  Present  Psychomotor Activity:  Normal  Concentration:  Fair  Recall:  [employer] of Knowledge:NA  Language: Fair  Akathisia:  No  Handed:    AIMS (if indicated):     Assets:  Desire for Improvement Housing Social Support  Sleep:  Number of Hours: 6.75    Musculoskeletal: Strength & Muscle Tone: within normal limits Gait & Station: normal Patient leans: N/A   Mental Status Per Nursing Assessment::   On Admission:  NA  Current Mental Status by Physician: In full contact with reality. There are no active SI plans or intent. States he feels ready to go home. Will pursue outpatient follow up. Will continue to go to NA   Loss Factors: Decline in physical health  Historical Factors: NA  Risk Reduction Factors:   Positive social support  Continued Clinical Symptoms:  Depression:   Comorbid alcohol abuse/dependence  Cognitive Features That Contribute To Risk:  Closed-mindedness Polarized thinking Thought constriction (tunnel vision)    Suicide Risk:  Minimal: No identifiable suicidal ideation.  Patients presenting with no risk factors but with morbid ruminations; may be classified as minimal  risk based on the severity of the depressive symptoms  Discharge Diagnoses:   AXIS I:  Major Depression recurrent with psychotic features, cocaine abuse AXIS II:  No diagnosis AXIS III:   Past Medical History  Diagnosis Date  . Diabetes mellitus     uncontrolled   . Hypertension   . Bipolar affective   . Depression    AXIS IV:  other psychosocial or environmental problems AXIS V:  61-70 mild symptoms  Plan Of Care/Follow-up recommendations:  Activity:  as tolerated Diet:  as per dietitian Follow up outpatient basis Is patient on multiple antipsychotic therapies at discharge:  No   Has Patient had three or more failed trials of antipsychotic monotherapy by history:  No  Recommended Plan for Multiple Antipsychotic Therapies: NA    Joliet Mallozzi A 08/31/2013, 12:01 PM

## 2013-08-31 NOTE — Progress Notes (Signed)
Patient ID: [name], male   DOB: 06/19/1960, 53 y.o.   MRN: [id number] He has been discharged home and was taken to [facility]  So that he could drive to his home. He voiced understanding of discharge instructions (medications) has refused follow up planning. He denies thoughts of SI and all his belongings were taken home with him.  He was alert pleasant at discharge.

## 2013-08-31 NOTE — Progress Notes (Signed)
Patient ID: [name], male   DOB: 04/19/1960, 53 y.o.   MRN: [id number] D: Pt. Visible on unit in dayroom watching TV, interacting with others. Pt. Sullen, reports "I'm not suppose to even be here, I just wanted someone to talk to and they sent me over here." "I'm going home tomorrow", denies SHI. A: Writer introduced self to client, provided emotional support, asks client about initial reports of SI ideation. Pt. encouraged to attend group. Staff will monitor q79min for safety. R: Pt. Is safe on the unit, refused to attend group, "I don't drink and I don't go to [place] group." pt. Denies SHI.
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
## 2013-09-02 NOTE — Progress Notes (Signed)
Patient Discharge Instructions:  No documentation was faxed for HBIPS.  Per the SW the patient refused follow up.  [name], 09/02/2013, 2:53 PM

## 2013-12-01 ENCOUNTER — Encounter (INDEPENDENT_AMBULATORY_CARE_PROVIDER_SITE_OTHER): Payer: Self-pay | Admitting: Ophthalmology

## 2013-12-02 ENCOUNTER — Encounter (INDEPENDENT_AMBULATORY_CARE_PROVIDER_SITE_OTHER): Payer: Self-pay | Admitting: Ophthalmology

## 2013-12-15 ENCOUNTER — Encounter (INDEPENDENT_AMBULATORY_CARE_PROVIDER_SITE_OTHER): Payer: Self-pay | Admitting: Ophthalmology

## 2013-12-31 ENCOUNTER — Inpatient Hospital Stay: Payer: Self-pay | Admitting: Psychiatry

## 2013-12-31 LAB — URINALYSIS, COMPLETE
Bacteria: NONE SEEN
Bilirubin,UR: NEGATIVE
Blood: NEGATIVE
Glucose,UR: >=500 mg/dL (ref 0–75)
Leukocyte Esterase: NEGATIVE
Nitrite: NEGATIVE
Ph: 5 (ref 4.5–8.0)
Protein: 30
RBC,UR: <1 /HPF (ref 0–5)
Specific Gravity: 1.032 (ref 1.003–1.030)
Squamous Epithelial: NONE SEEN
WBC UR: 1 /HPF (ref 0–5)

## 2013-12-31 LAB — ETHANOL: Ethanol: <3 mg/dL

## 2013-12-31 LAB — COMPREHENSIVE METABOLIC PANEL
Albumin: 3.4 g/dL (ref 3.4–5.0)
Alkaline Phosphatase: 76 U/L
Anion Gap: 9 (ref 7–16)
BUN: 8 mg/dL (ref 7–18)
Bilirubin,Total: 0.6 mg/dL (ref 0.2–1.0)
Calcium, Total: 8.6 mg/dL (ref 8.5–10.1)
Chloride: 101 mmol/L (ref 98–107)
Co2: 25 mmol/L (ref 21–32)
Creatinine: 0.88 mg/dL (ref 0.60–1.30)
EGFR (African American): >60
EGFR (Non-African Amer.): >60
Glucose: 249 mg/dL — ABNORMAL HIGH (ref 65–99)
Osmolality: 277 (ref 275–301)
Potassium: 3.8 mmol/L (ref 3.5–5.1)
SGOT(AST): 23 U/L (ref 15–37)
SGPT (ALT): 20 U/L
Sodium: 135 mmol/L — ABNORMAL LOW (ref 136–145)
Total Protein: 7.8 g/dL (ref 6.4–8.2)

## 2013-12-31 LAB — DRUG SCREEN, URINE
Amphetamines, Ur Screen: NEGATIVE (ref ?–1000)
Barbiturates, Ur Screen: NEGATIVE (ref ?–200)
Benzodiazepine, Ur Scrn: NEGATIVE (ref ?–200)
Cannabinoid 50 Ng, Ur ~~LOC~~: NEGATIVE (ref ?–50)
Cocaine Metabolite,Ur ~~LOC~~: POSITIVE (ref ?–300)
MDMA (Ecstasy)Ur Screen: NEGATIVE (ref ?–500)
Methadone, Ur Screen: NEGATIVE (ref ?–300)
Opiate, Ur Screen: NEGATIVE (ref ?–300)
Phencyclidine (PCP) Ur S: NEGATIVE (ref ?–25)
Tricyclic, Ur Screen: NEGATIVE (ref ?–1000)

## 2013-12-31 LAB — CBC
HCT: 43 % (ref 40.0–52.0)
HGB: 13.9 g/dL (ref 13.0–18.0)
MCH: 26 pg (ref 26.0–34.0)
MCHC: 32.2 g/dL (ref 32.0–36.0)
MCV: 81 fL (ref 80–100)
Platelet: 184 10*3/uL (ref 150–440)
RBC: 5.32 10*6/uL (ref 4.40–5.90)
RDW: 14.4 % (ref 11.5–14.5)
WBC: 3.3 10*3/uL — ABNORMAL LOW (ref 3.8–10.6)

## 2013-12-31 LAB — SALICYLATE LEVEL: Salicylates, Serum: <1.7 mg/dL

## 2013-12-31 LAB — ACETAMINOPHEN LEVEL: Acetaminophen: <2 ug/mL

## 2013-12-31 LAB — TSH: Thyroid Stimulating Horm: 0.884 u[IU]/mL

## 2013-12-31 LAB — HEMOGLOBIN A1C: Hemoglobin A1C: 14 % — ABNORMAL HIGH (ref 4.2–6.3)

## 2014-02-22 DIAGNOSIS — I5020 Unspecified systolic (congestive) heart failure: Secondary | ICD-10-CM | POA: Insufficient documentation

## 2014-05-31 DIAGNOSIS — F1410 Cocaine abuse, uncomplicated: Secondary | ICD-10-CM | POA: Insufficient documentation

## 2014-05-31 DIAGNOSIS — F1420 Cocaine dependence, uncomplicated: Secondary | ICD-10-CM

## 2014-05-31 DIAGNOSIS — Z9119 Patient's noncompliance with other medical treatment and regimen: Secondary | ICD-10-CM | POA: Insufficient documentation

## 2014-05-31 DIAGNOSIS — Z765 Malingerer [conscious simulation]: Secondary | ICD-10-CM | POA: Insufficient documentation

## 2014-05-31 DIAGNOSIS — G8929 Other chronic pain: Secondary | ICD-10-CM | POA: Insufficient documentation

## 2014-05-31 DIAGNOSIS — Z91199 Patient's noncompliance with other medical treatment and regimen due to unspecified reason: Secondary | ICD-10-CM | POA: Insufficient documentation

## 2014-05-31 DIAGNOSIS — D72819 Decreased white blood cell count, unspecified: Secondary | ICD-10-CM | POA: Insufficient documentation

## 2014-05-31 DIAGNOSIS — D469 Myelodysplastic syndrome, unspecified: Secondary | ICD-10-CM | POA: Insufficient documentation

## 2014-05-31 DIAGNOSIS — E1140 Type 2 diabetes mellitus with diabetic neuropathy, unspecified: Secondary | ICD-10-CM | POA: Insufficient documentation

## 2014-05-31 DIAGNOSIS — E0840 Diabetes mellitus due to underlying condition with diabetic neuropathy, unspecified: Secondary | ICD-10-CM | POA: Insufficient documentation

## 2014-05-31 DIAGNOSIS — D61818 Other pancytopenia: Secondary | ICD-10-CM | POA: Insufficient documentation

## 2014-05-31 HISTORY — DX: Cocaine abuse, uncomplicated: F14.10

## 2014-05-31 HISTORY — DX: Cocaine dependence, uncomplicated: F14.20

## 2014-06-07 DIAGNOSIS — Z91199 Patient's noncompliance with other medical treatment and regimen due to unspecified reason: Secondary | ICD-10-CM | POA: Insufficient documentation

## 2014-06-07 DIAGNOSIS — Z9119 Patient's noncompliance with other medical treatment and regimen: Secondary | ICD-10-CM | POA: Insufficient documentation
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
## 2014-06-18 NOTE — Consult Note (Signed)
PATIENT NAME:  [name], [name] MR#:  [id number] DATE OF BIRTH:  June 17, 1960  DATE OF CONSULTATION:  12/31/2013  REFERRING PHYSICIAN:   CONSULTING PHYSICIAN:  Tana Conch. Leslye Peer, MD  PRIMARY CARE PHYSICIAN:  [facility] in [place].    REASON FOR CONSULTATION: Medical management for diabetes.   HISTORY OF PRESENT ILLNESS: This is a 54 year old man who has had diabetes since a young age, also hypertension. He is being admitted to the psychiatric floor by Dr. Weber Cooks.  Medical management for diabetes was asked because sugars have been high.  As per the patient, he does not eat right and he is not compliant with the diet. In speaking with the pharmacy tech, they confirm that he is only taking 1 type of insulin at the pharmacy, Levemir 25 units in the evening, lovastatin 20 mg at night and metformin for diabetes. Initially, they had him on 2 types of insulin. The patient feels okay physically. He does have a lot of joint pains from prior trauma to his right knee, but offers no complaints.   PAST MEDICAL HISTORY: Diabetes, hypertension, right knee pain secondary to trauma, arthritis, retinopathy, history of bronchitis.   PAST SURGICAL HISTORY: Right neck surgery.   ALLERGIES: No known drug allergies.   MEDICATIONS: As per prescription writer include amlodipine 10 mg daily, Levemir 25 units in the evening, lovastatin 20 mg once a day, meloxicam 7.5 mg twice a day, metformin 1000 mg twice a day.   SOCIAL HISTORY: Currently on disability. No smoking. No alcohol. No drug use. 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   FAMILY HISTORY: Father with diabetes, hypertension, legally blind. Mother died of cancer that spread to the spine and brain stem, had cervical cancer to start with; also legally blind.   REVIEW OF SYSTEMS:   CONSTITUTIONAL: Positive for fatigue. No fever or chills, or sweats. No weight loss. No weight gain. No weakness.  EYES: Positive for retinopathy in the eye.   EARS,  NOSE, MOUTH AND THROAT: No hearing loss. No sore throat. No difficulty swallowing.  CARDIOVASCULAR: Positive for chest pain on and off.  When it happens, he cannot eat or drink. He cannot breathe.  It happens on the right side of his chest. Last time it happened, it happened a few days ago.  RESPIRATORY: Positive for shortness of breath. Positive for cough. No sputum. No hemoptysis.  GASTROINTESTINAL: Occasional abdominal pain. No nausea. No vomiting. No diarrhea. No constipation.  GENITOURINARY: No burning on urination or hematuria.  MUSCULOSKELETAL: Positive for joint pain knees, hands.  INTEGUMENT: No rashes or eruptions.  NEUROLOGIC: Decreased feeling in his hands and feet.  PSYCHIATRY:  Patient being admitted for suicidal ideation.  ENDOCRINE: No thyroid problems.  HEMATOLOGIC AND LYMPHATIC: No anemia, no easy bruising or bleeding.   PHYSICAL EXAMINATION:  VITAL SIGNS: Last temperature 98, pulse 98, respirations 16, blood pressure 140/90, pulse oximetry 97% on room air.  GENERAL: No respiratory distress.  EYES: Conjunctivae and lids normal. Pupils equal, round, and reactive to light. Extraocular muscles intact. No nystagmus.  EARS, NOSE, MOUTH AND THROAT: Tympanic membranes: No erythema. Nasal mucosa: No erythema. Throat: No erythema, no exudate seen. Lips and gums: No lesions.  NECK: No JVD. No bruits. No lymphadenopathy. No thyromegaly. No thyroid nodules palpated.  RESPIRATORY:  Lungs clear to auscultation. No use of accessory muscles to breathe. No rhonchi, rales, or wheeze heard.  CARDIOVASCULAR SYSTEM: S1, S2 normal, [id number] systolic ejection murmur. Carotid upstroke 2+ bilaterally. No bruits. Dorsalis pedis  pulses 2+ bilaterally. Trace edema of the lower extremity.  ABDOMEN: Soft, nontender. No organomegaly/splenomegaly. Normoactive bowel sounds. No masses felt.  LYMPHATIC: No lymph nodes in the neck.  MUSCULOSKELETAL: No clubbing. Trace edema. No cyanosis.  SKIN: No ulcers or lesions  seen.  NEUROLOGIC: Cranial nerves II through XII grossly intact. Deep tendon reflexes 2+ bilateral lower extremities.  PSYCHIATRIC: The patient is oriented to person, place, and time.   LABORATORY AND RADIOLOGICAL DATA: White blood cell count 3.3, hemoglobin and hematocrit 13.9 and 43.0, platelet count of 184,000. Glucose 249, BUN 8, creatinine 0.88, sodium 135, potassium 3.8, chloride 101, CO2 of 25, calcium 8.6. Liver function tests normal range.  Acetaminophen less than 2. Ethanol less than 3.   TSH 0.884.   ASSESSMENT AND PLAN:  1. Uncontrolled diabetes. I added on a hemoglobin A1c and that is 14. His average sugar over the last 12 weeks has been up at 360. His sugar currently is less than what he is averaging.  This suggests noncompliance with medications. The patient states that he is also noncompliant with his diet. I did discuss diet at length with the patient.  I will increase the patient's Levemir to 35 units and put on sliding scale, NovoLog and continue to monitor sugars closely, titrate depending on what he is eating.  2. Hypertension, add low-dose lisinopril. Continue Norvasc.  3. Arthritis. We will continue once a day meloxicam.  4. Diabetic retinopathy. Follow-up with eye doctor as outpatient.  5. Diabetic neuropathy.  Hold off on medications at this time.  6. Hyperlipidemia. Continue Mevacor.    TIME SPENT ON CONSULTATION:  50 minutes.       ____________________________ Tana Conch. Leslye Peer, MD rjw:by D: 12/31/2013 19:23:29 ET T: 12/31/2013 21:08:54 ET JOB#: [id number]  cc: Tana Conch. Leslye Peer, MD, <Dictator> [facility] Marisue Brooklyn MD ELECTRONICALLY SIGNED 01/06/2014 1:39

## 2014-10-30 DIAGNOSIS — F1999 Other psychoactive substance use, unspecified with unspecified psychoactive substance-induced disorder: Secondary | ICD-10-CM | POA: Insufficient documentation

## 2014-12-01 ENCOUNTER — Encounter (HOSPITAL_COMMUNITY): Payer: Self-pay | Admitting: Emergency Medicine

## 2014-12-01 ENCOUNTER — Emergency Department (HOSPITAL_COMMUNITY)
Admission: EM | Admit: 2014-12-01 | Discharge: 2014-12-02 | Payer: Medicare Other | Attending: Emergency Medicine | Admitting: Emergency Medicine

## 2014-12-01 DIAGNOSIS — I10 Essential (primary) hypertension: Secondary | ICD-10-CM | POA: Insufficient documentation

## 2014-12-01 DIAGNOSIS — F319 Bipolar disorder, unspecified: Secondary | ICD-10-CM | POA: Diagnosis not present

## 2014-12-01 DIAGNOSIS — E119 Type 2 diabetes mellitus without complications: Secondary | ICD-10-CM | POA: Diagnosis not present

## 2014-12-01 DIAGNOSIS — Z87891 Personal history of nicotine dependence: Secondary | ICD-10-CM | POA: Insufficient documentation

## 2014-12-01 DIAGNOSIS — F1410 Cocaine abuse, uncomplicated: Secondary | ICD-10-CM | POA: Insufficient documentation

## 2014-12-01 DIAGNOSIS — R45850 Homicidal ideations: Secondary | ICD-10-CM

## 2014-12-01 DIAGNOSIS — Z79899 Other long term (current) drug therapy: Secondary | ICD-10-CM | POA: Diagnosis not present

## 2014-12-01 LAB — COMPREHENSIVE METABOLIC PANEL
ALT: 16 U/L — ABNORMAL LOW (ref 17–63)
AST: 26 U/L (ref 15–41)
Albumin: 3.8 g/dL (ref 3.5–5.0)
Alkaline Phosphatase: 62 U/L (ref 38–126)
Anion gap: 8 (ref 5–15)
BUN: 13 mg/dL (ref 6–20)
CO2: 23 mmol/L (ref 22–32)
Calcium: 8.8 mg/dL — ABNORMAL LOW (ref 8.9–10.3)
Chloride: 104 mmol/L (ref 101–111)
Creatinine, Ser: 1.11 mg/dL (ref 0.61–1.24)
GFR calc Af Amer: >60 mL/min (ref >60)
GFR calc non Af Amer: >60 mL/min (ref >60)
Glucose, Bld: 353 mg/dL — ABNORMAL HIGH (ref 65–99)
Potassium: 3.6 mmol/L (ref 3.5–5.1)
Sodium: 135 mmol/L (ref 135–145)
Total Bilirubin: 0.5 mg/dL (ref 0.3–1.2)
Total Protein: 7.2 g/dL (ref 6.5–8.1)

## 2014-12-01 LAB — CBC
HCT: 38.1 % — ABNORMAL LOW (ref 39.0–52.0)
Hemoglobin: 12.4 g/dL — ABNORMAL LOW (ref 13.0–17.0)
MCH: 26.2 pg (ref 26.0–34.0)
MCHC: 32.5 g/dL (ref 30.0–36.0)
MCV: 80.5 fL (ref 78.0–100.0)
Platelets: 182 10*3/uL (ref 150–400)
RBC: 4.73 MIL/uL (ref 4.22–5.81)
RDW: 14 % (ref 11.5–15.5)
WBC: 3.5 10*3/uL — ABNORMAL LOW (ref 4.0–10.5)

## 2014-12-01 LAB — ACETAMINOPHEN LEVEL: Acetaminophen (Tylenol), Serum: <10 ug/mL — ABNORMAL LOW (ref 10–30)

## 2014-12-01 LAB — SALICYLATE LEVEL: Salicylate Lvl: <4 mg/dL (ref 2.8–30.0)

## 2014-12-01 LAB — ETHANOL: Alcohol, Ethyl (B): <5 mg/dL (ref <5)

## 2014-12-01 MED ORDER — NICOTINE 21 MG/24HR TD PT24: 21.0000 mg | MEDICATED_PATCH | Freq: Every day | TRANSDERMAL | Status: DC

## 2014-12-01 MED ORDER — ACETAMINOPHEN 325 MG PO TABS: 650.0000 mg | ORAL_TABLET | ORAL | Status: DC | PRN

## 2014-12-01 MED ORDER — IBUPROFEN 200 MG PO TABS: 600.0000 mg | ORAL_TABLET | Freq: Three times a day (TID) | ORAL | Status: DC | PRN

## 2014-12-01 NOTE — ED Notes (Signed)
Pt states he's been having problems with his relationship. Came today from [place] looking for his girlfriend who was supposed to be in the hospital, when he got here today she was no where to be found in the hospital. Pt having thoughts of killing himself, her, and anyone else.

## 2014-12-01 NOTE — Progress Notes (Signed)
Patient listed as having Medicare insurance without a pcp.  EDCM spoke to patient at bedside.  Patient confirms his pcp is located at [facility] on the [address] in [place].  System updated.

## 2014-12-01 NOTE — Clinical Social Work Note (Signed)
Pt provided his girlfriends name but no contact information was provided.  Attempted to obtain information regarding pt's girlfriends number and or address but unsuccessful.  Pt did state that he told his girlfriend today that he was going to hurt her so she is aware of his homicidal thoughts.  If any further information is obtained regarding girlfriend contact number or address please advise her regarding pt's homicidal thoughts.  Dede Query, LCSW

## 2014-12-01 NOTE — Clinical Social Work Note (Signed)
CSW spoke with MD to provide disposition  .Dede Query, [facility] Triage TTS
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
## 2014-12-01 NOTE — BH Assessment (Addendum)
Tele Assessment Note   [name] is an 54 y.o. male who presented to [facility] with homicidal ideations towards his girlfriend.  Patient discussed a long distance two year relationship with his girlfriend who resides in [place].  He reported that she had been pregnant with what he thought was his baby and told him today that she was having difficulty, thought she was having the baby and was headed to [facility].  He stated that he took the train from [place] and arrived at [employer] and she was not there.  He reported that he called her and she told him that she had been lying to him and that the baby was not his.  He stated "I think she deserves to pay for it and I should put her out of her misery."  He reported that he had told her in the past that if she "ever hurt me I would hurt her."  He stated "I'm going to do something to her."  Patient reported that he had told his girlfriend today that he was going to hurt her and she stated that she did not believe he would do so.  He stated "I'm going to hurt her."  When asked what his plan was for hurting her he stated "I know how I want to.  I'd like to choke the shit out of her."  Patient denied any history of homicidal ideations towards his girlfriend or any history of assault charges.  He reported that due to some of the neighborhoods he has lived in he has had to fight and he thought that "fighting is good exercise."    Patient reported a history of [facility] in [place] where he was originally diagnosed with "Chronic depression and Bipolar" and prescribed an antidepressant and some medications to help with his sleep. He reported that his primary care doctor now prescribes his medications.  Patient reported at least "2 or 3" in patient hospitazations and 2 attempted suicides.  He reported that his last inpatient was over a year ago in [place].  He reported that one of his suicide attempts was where he  had put a gun in his mouth pulled the trigger and the gun misfired. He reported his last attempt was about a year ago where he jumped off a bridge.    Patient reported that he uses "powder cocaine".  He would not state how much he uses or when the last time was but did stated that "it was not yesterday."  He stated that he began using cocaine at the age of 68 and that his longest clean time was 8 years.  Consulted with NP May who recommended in patient treatment.       Diagnosis: Axis I:  296.7 Bipolar I disorder, Current or most recent episode unspecified,  304.20 Cocaine use disorder, Moderate                   Axis II:  Deferred                   Axis III:  See below                   Axis IV:  Problems with primary support, occupational problems,                    Axis V:  31-40 some impairment in reality testing or communication    Past  Medical History:  Past Medical History  Diagnosis Date  . Diabetes mellitus     uncontrolled   . Hypertension   . Bipolar affective ([place])   . Depression     Past Surgical History  Procedure Laterality Date  . Knee surgery      b/l knees; pending total knee     Family History:  Family History  Problem Relation Age of Onset  . Diabetes    . Cancer Mother     uterus; deceased   . Hypertension    . Heart attack Father     alive   . Stroke Sister     12/2012 age 13 y.o    Social History:  reports that he has quit smoking. He does not have any smokeless tobacco history on file. He reports that he does not drink alcohol or use illicit drugs.  Additional Social History:     CIWA: CIWA-Ar BP: 126/79 mmHg Pulse Rate: 92 COWS:    PATIENT STRENGTHS: (choose at least two) Active sense of humor Capable of independent living  Allergies:  Allergies  Allergen Reactions  . Other Anaphylaxis    Allergy to GRITS , unknown exactly the specific ingredient he is allergic too  . Corn Oil Swelling    Home Medications:  (Not in a hospital  admission)  OB/GYN Status:  No LMP for male patient.  General Assessment Data Location of Assessment: [facility] ED TTS Assessment: In system Is this a Tele or Face-to-Face Assessment?: Tele Assessment Is this an Initial Assessment or a Re-assessment for this encounter?: Initial Assessment Marital status: Divorced [place] name:  (n/a) Is patient pregnant?: No Pregnancy Status: No Living Arrangements: Alone Can pt return to current living arrangement?: Yes Admission Status: Voluntary Is patient capable of signing voluntary admission?: Yes Referral Source: MD Insurance type:  (medicare)  Medical Screening Exam ([place]) Medical Exam completed: No Reason for MSE not completed: Other: (in process)  Crisis Care Plan Living Arrangements: Alone Name of Psychiatrist:  ([facility]) Name of Therapist:  (none)  Education Status Is patient currently in school?: No Current Grade:  (none) Highest grade of school patient has completed:  (high school) Name of school:  (n/a) Contact person:  (n/a)  Risk to self with the past 6 months Suicidal Ideation: Yes-Currently Present Has patient been a risk to self within the past 6 months prior to admission? : No Suicidal Intent: Yes-Currently Present Has patient had any suicidal intent within the past 6 months prior to admission? : No Is patient at risk for suicide?: Yes Suicidal Plan?: No Has patient had any suicidal plan within the past 6 months prior to admission? : No Access to Means: No What has been your use of drugs/alcohol within the last 12 months?:  (cocaine) Previous Attempts/Gestures: Yes How many times?:  (2) Other Self Harm Risks:  (none) Triggers for Past Attempts: Unknown Intentional Self Injurious Behavior: None Family Suicide History: No Recent stressful life event(s): Conflict (Comment) (girlfriend lied about being pregnant with his baby) Persecutory voices/beliefs?: Yes Depression: Yes Depression  Symptoms: Insomnia, Tearfulness, Isolating, Loss of interest in usual pleasures, Feeling worthless/self pity Substance abuse history and/or treatment for substance abuse?: Yes Suicide prevention information given to non-admitted patients: Not applicable  Risk to Others within the past 6 months Homicidal Ideation: Yes-Currently Present Does patient have any lifetime risk of violence toward others beyond the six months prior to admission? : Unknown Thoughts of Harm to Others: Yes-Currently Present Comment -  Thoughts of Harm to Others:  (wants to choke his girlfriend) Current Homicidal Intent: Yes-Currently Present Current Homicidal Plan: Yes-Currently Present Describe Current Homicidal Plan:  ("choke the shit out of her (girlfriend)") Access to Homicidal Means: Yes Describe Access to Homicidal Means:  (he is a big man/ also discussed how easy it is to obtain gun) Identified Victim:  Ivor Reining) History of harm to others?: Yes Assessment of Violence: On admission Violent Behavior Description:  (states the neighborhood he has been in he has to fight) Does patient have access to weapons?: Yes (Comment) (states it is easy to access a gun although he denied owning) Criminal Charges Pending?: No Does patient have a court date: No Is patient on probation?: No  Psychosis Hallucinations: None noted Delusions: Persecutory  Mental Status Report Appearance/Hygiene: In scrubs Eye Contact: Fair Motor Activity: Restlessness Speech: Logical/coherent Level of Consciousness: Restless Mood: Angry Affect: Angry Anxiety Level: Minimal Thought Processes: Coherent Judgement: Impaired Orientation: Person, Place, Time, Situation Obsessive Compulsive Thoughts/Behaviors: None  Cognitive Functioning Concentration: Decreased Memory: Recent Impaired, Remote Impaired IQ: Average Insight: Poor Impulse Control: Poor Appetite: Good Weight Loss:  (none reported) Weight Gain:  (none reported) Sleep:  Decreased Total Hours of Sleep:  (2 - 3 hours if he does not take his medications) Vegetative Symptoms: None  ADLScreening [facility] Assessment Services) Patient's cognitive ability adequate to safely complete daily activities?: Yes Patient able to express need for assistance with ADLs?: Yes Independently performs ADLs?: Yes (appropriate for developmental age)  Prior Inpatient Therapy Prior Inpatient Therapy: Yes Prior Therapy Dates:  (2015) Prior Therapy Facilty/Provider(s):  ([facility]) Reason for Treatment:  (suicidal ideations)  Prior Outpatient Therapy Prior Outpatient Therapy: No Prior Therapy Dates:  (none) Prior Therapy Facilty/Provider(s):  (none) Reason for Treatment:  (none) Does patient have an ACCT team?: No Does patient have Intensive In-House Services?  : No Does patient have [facility] services? : No Does patient have P4CC services?: No  ADL Screening (condition at time of admission) Patient's cognitive ability adequate to safely complete daily activities?: Yes Is the patient deaf or have difficulty hearing?: No Does the patient have difficulty seeing, even when wearing glasses/contacts?: No Does the patient have difficulty concentrating, remembering, or making decisions?: No Patient able to express need for assistance with ADLs?: Yes Does the patient have difficulty dressing or bathing?: No Independently performs ADLs?: Yes (appropriate for developmental age) Does the patient have difficulty walking or climbing stairs?: No Weakness of Legs: Both (uses a cane) Weakness of Arms/Hands: None  Home Assistive Devices/Equipment Home Assistive Devices/Equipment: Cane (specify quad or straight) (unknown which type of cane)  Therapy Consults (therapy consults require a physician order) PT Evaluation Needed: No OT Evalulation Needed: No SLP Evaluation Needed: No Abuse/Neglect Assessment (Assessment to be complete while patient is alone) Physical Abuse: Denies Verbal Abuse:  Denies Sexual Abuse: Denies Exploitation of patient/patient's resources: Denies Self-Neglect: Denies Values / Beliefs Cultural Requests During Hospitalization: None Spiritual Requests During Hospitalization: None Consults Spiritual Care Consult Needed: No Social Work Consult Needed: No Regulatory affairs officer (For Healthcare) Does patient have an advance directive?: No Would patient like information on creating an advanced directive?: No - patient declined information    Additional Information 1:1 In Past 12 Months?: No CIRT Risk: Yes Elopement Risk: No Does patient have medical clearance?: No     Disposition:  Disposition Initial Assessment Completed for this Encounter: Yes Disposition of Patient: Inpatient treatment program Type of inpatient treatment program: Adult  Carlean Jews 12/01/2014 6:27 PM

## 2014-12-01 NOTE — Progress Notes (Addendum)
Patient was referred for inpatient psych treatment at: [facility] - per Legrand Como, fax referral, beds open. [facility] - per Junita Push, fax referral, couple beds open. [facility] - left voicemail with contact number regarding bed availability. [facility] - per intake, fax referral for the waitlist. [facility] - per Ronalee Belts, fax referral, 2 male adult beds open. [facility] - per intake, fax referral for review.  At capacity: [facility] [place] Disposition staff 12/01/2014 8:33 PM

## 2014-12-02 DIAGNOSIS — F319 Bipolar disorder, unspecified: Secondary | ICD-10-CM | POA: Diagnosis not present

## 2014-12-02 LAB — CBG MONITORING, ED: Glucose-Capillary: 257 mg/dL — ABNORMAL HIGH (ref 65–99)

## 2014-12-02 LAB — RAPID URINE DRUG SCREEN, HOSP PERFORMED
Amphetamines: NOT DETECTED
Barbiturates: NOT DETECTED
Benzodiazepines: NOT DETECTED
Cocaine: POSITIVE — AB
Opiates: NOT DETECTED
Tetrahydrocannabinol: NOT DETECTED

## 2014-12-02 MED ORDER — GABAPENTIN 400 MG PO CAPS: 400.0000 mg | ORAL_CAPSULE | Freq: Three times a day (TID) | ORAL | Status: DC

## 2014-12-02 MED ORDER — LEVEMIR FLEXTOUCH 100 UNIT/ML ~~LOC~~ SOPN: 20.0000 [IU] | PEN_INJECTOR | Freq: Every day | SUBCUTANEOUS | Status: DC

## 2014-12-02 MED ORDER — LISINOPRIL 20 MG PO TABS
20.0000 mg | ORAL_TABLET | Freq: Once | ORAL | Status: AC
  Administered 2014-12-02: 20 mg via ORAL
  Filled 2014-12-02: qty 1

## 2014-12-02 MED ORDER — BUPROPION HCL 75 MG PO TABS: 225.0000 mg | ORAL_TABLET | Freq: Every day | ORAL | Status: DC

## 2014-12-02 MED ORDER — METFORMIN HCL 500 MG PO TABS
1000.0000 mg | ORAL_TABLET | Freq: Once | ORAL | Status: AC
  Administered 2014-12-02: 1000 mg via ORAL
  Filled 2014-12-02: qty 2

## 2014-12-02 MED ORDER — DULOXETINE HCL 60 MG PO CPEP: 60.0000 mg | ORAL_CAPSULE | Freq: Every day | ORAL | Status: DC

## 2014-12-02 MED ORDER — NICOTINE 21 MG/24HR TD PT24: 21.0000 mg | MEDICATED_PATCH | Freq: Every day | TRANSDERMAL | Status: DC

## 2014-12-02 NOTE — Progress Notes (Signed)
Pt continues to present as homicidal and has been accepted at [facility]. Transport is here to pick him up and he was pending discharge upon our arrival this morning.  Soryn, Grazioli, [name] 12/02/2014 9:20 AM

## 2014-12-02 NOTE — BH Assessment (Signed)
[place] Assessment Progress Note   Pt accepted to [facility] per [place] there.  Accepting physician is Dr. Lazarus Salines.  Nurse call report number is [phone].  If pt is transported by [employer], patient will need to get transportation back to [place].  Can come after 10:00.

## 2014-12-02 NOTE — ED Notes (Signed)
Report given to [facility] at [facility]. Sheriff's office will transport patient. Pt ready for transfer.

## 2015-01-03 ENCOUNTER — Emergency Department
Admission: EM | Admit: 2015-01-03 | Discharge: 2015-01-04 | Disposition: A | Discharge status: Other Acute Inpatient Hospital | Payer: Medicare Other | Attending: Emergency Medicine | Admitting: Emergency Medicine

## 2015-01-03 ENCOUNTER — Encounter: Payer: Self-pay | Admitting: Emergency Medicine

## 2015-01-03 DIAGNOSIS — Z87891 Personal history of nicotine dependence: Secondary | ICD-10-CM | POA: Insufficient documentation

## 2015-01-03 DIAGNOSIS — F329 Major depressive disorder, single episode, unspecified: Secondary | ICD-10-CM | POA: Diagnosis not present

## 2015-01-03 DIAGNOSIS — F333 Major depressive disorder, recurrent, severe with psychotic symptoms: Secondary | ICD-10-CM | POA: Diagnosis not present

## 2015-01-03 DIAGNOSIS — Z79899 Other long term (current) drug therapy: Secondary | ICD-10-CM | POA: Diagnosis not present

## 2015-01-03 DIAGNOSIS — E119 Type 2 diabetes mellitus without complications: Secondary | ICD-10-CM | POA: Insufficient documentation

## 2015-01-03 DIAGNOSIS — F32A Depression, unspecified: Secondary | ICD-10-CM

## 2015-01-03 DIAGNOSIS — I129 Hypertensive chronic kidney disease with stage 1 through stage 4 chronic kidney disease, or unspecified chronic kidney disease: Secondary | ICD-10-CM | POA: Diagnosis present

## 2015-01-03 DIAGNOSIS — IMO0002 Reserved for concepts with insufficient information to code with codable children: Secondary | ICD-10-CM | POA: Diagnosis present

## 2015-01-03 DIAGNOSIS — E1165 Type 2 diabetes mellitus with hyperglycemia: Secondary | ICD-10-CM | POA: Diagnosis present

## 2015-01-03 DIAGNOSIS — F1420 Cocaine dependence, uncomplicated: Secondary | ICD-10-CM

## 2015-01-03 DIAGNOSIS — I10 Essential (primary) hypertension: Secondary | ICD-10-CM | POA: Diagnosis not present

## 2015-01-03 DIAGNOSIS — R45851 Suicidal ideations: Secondary | ICD-10-CM | POA: Diagnosis present

## 2015-01-03 DIAGNOSIS — E1169 Type 2 diabetes mellitus with other specified complication: Secondary | ICD-10-CM | POA: Diagnosis present

## 2015-01-03 LAB — CBC WITH DIFFERENTIAL/PLATELET
Basophils Absolute: 0 10*3/uL (ref 0–0.1)
Basophils Relative: 0 %
Eosinophils Absolute: 0 10*3/uL (ref 0–0.7)
Eosinophils Relative: 1 %
HCT: 40.8 % (ref 40.0–52.0)
Hemoglobin: 13.4 g/dL (ref 13.0–18.0)
Lymphocytes Relative: 57 %
Lymphs Abs: 2 10*3/uL (ref 1.0–3.6)
MCH: 26.2 pg (ref 26.0–34.0)
MCHC: 32.9 g/dL (ref 32.0–36.0)
MCV: 79.7 fL — ABNORMAL LOW (ref 80.0–100.0)
Monocytes Absolute: 0.4 10*3/uL (ref 0.2–1.0)
Monocytes Relative: 12 %
Neutro Abs: 1.1 10*3/uL — ABNORMAL LOW (ref 1.4–6.5)
Neutrophils Relative %: 30 %
Platelets: 170 10*3/uL (ref 150–440)
RBC: 5.12 MIL/uL (ref 4.40–5.90)
RDW: 14.4 % (ref 11.5–14.5)
WBC: 3.6 10*3/uL — ABNORMAL LOW (ref 3.8–10.6)

## 2015-01-03 LAB — GLUCOSE, CAPILLARY: Glucose-Capillary: 195 mg/dL — ABNORMAL HIGH (ref 65–99)

## 2015-01-03 LAB — COMPREHENSIVE METABOLIC PANEL
ALT: 13 U/L — ABNORMAL LOW (ref 17–63)
AST: 18 U/L (ref 15–41)
Albumin: 4 g/dL (ref 3.5–5.0)
Alkaline Phosphatase: 61 U/L (ref 38–126)
Anion gap: 9 (ref 5–15)
BUN: 20 mg/dL (ref 6–20)
CO2: 25 mmol/L (ref 22–32)
Calcium: 9.8 mg/dL (ref 8.9–10.3)
Chloride: 102 mmol/L (ref 101–111)
Creatinine, Ser: 1.15 mg/dL (ref 0.61–1.24)
GFR calc Af Amer: >60 mL/min (ref >60)
GFR calc non Af Amer: >60 mL/min (ref >60)
Glucose, Bld: 246 mg/dL — ABNORMAL HIGH (ref 65–99)
Potassium: 3.9 mmol/L (ref 3.5–5.1)
Sodium: 136 mmol/L (ref 135–145)
Total Bilirubin: 0.5 mg/dL (ref 0.3–1.2)
Total Protein: 7.9 g/dL (ref 6.5–8.1)

## 2015-01-03 LAB — ETHANOL: Alcohol, Ethyl (B): <5 mg/dL (ref <5)

## 2015-01-03 NOTE — ED Provider Notes (Signed)
[facility] Emergency Department Provider Note  Time seen: 8:52 PM  I have reviewed the triage vital signs and the nursing notes.   HISTORY  Chief Complaint Suicidal    HPI [name] is a 54 y.o. male with a past medical history of hypertension, diabetes, bipolar who presents the emergency department with suicidal ideation. According to the patient he was driving home from his brother's funeral when he began having suicidal thoughts. Was thinking of driving his car into traffic to kill himself. He states he has tried killing himself several times in the past. Denies alcohol or drug use. States he continues to have suicidal thoughts. Denies any homicidal thoughts.    Past Medical History  Diagnosis Date  . Diabetes mellitus     uncontrolled   . Hypertension   . Bipolar affective ([place])   . Depression     Patient Active Problem List   Diagnosis Date Noted  . Depression, major, recurrent, severe with psychosis ([place]) 08/29/2013  . Suicidal ideations 08/28/2013  . Chest pain 06/30/2013  . HTN (hypertension) 06/30/2013  . Uncontrolled diabetes mellitus ([place]) 06/30/2013  . Bipolar affect, depressed ([place]) 05/13/2011    Past Surgical History  Procedure Laterality Date  . Knee surgery      b/l knees; pending total knee     Current Outpatient Rx  Name  Route  Sig  Dispense  Refill  . buPROPion (WELLBUTRIN) 75 MG tablet   Oral   Take 3 tablets (225 mg total) by mouth daily.         . DULoxetine (CYMBALTA) 60 MG capsule   Oral   Take 1 capsule (60 mg total) by mouth daily.         Marland Kitchen EXPIRED: gabapentin (NEURONTIN) 400 MG capsule   Oral   Take 1 capsule (400 mg total) by mouth 3 (three) times daily.         Marland Kitchen LEVEMIR FLEXTOUCH 100 UNIT/ML Pen   Subcutaneous   Inject 20 Units into the skin at bedtime. TO BE MANAGED BY HOSPITAL WITH DOSING   15 mL        Dispense as written.   Marland Kitchen lisinopril (PRINIVIL,ZESTRIL) 20 MG tablet   Oral  Take 1 tablet (20 mg total) by mouth daily. For hypertension   30 tablet   0   . metFORMIN (GLUCOPHAGE) 1000 MG tablet   Oral   Take 1 tablet (1,000 mg total) by mouth 2 (two) times daily with a meal. For diabetes control         . nicotine (NICODERM CQ - DOSED IN MG/24 HOURS) 21 mg/24hr patch   Transdermal   Place 1 patch (21 mg total) onto the skin daily.   28 patch   0     Allergies Other and Corn oil  Family History  Problem Relation Age of Onset  . Diabetes    . Cancer Mother     uterus; deceased   . Hypertension    . Heart attack Father     alive   . Stroke Sister     12/2012 age 27 y.o    Social History Social History  Substance Use Topics  . Smoking status: Former Research scientist (life sciences)  . Smokeless tobacco: None  . Alcohol Use: No    Review of Systems Constitutional: Negative for fever. Cardiovascular: Negative for chest pain. Respiratory: Negative for shortness of breath. Gastrointestinal: Negative for abdominal pain Neurological: Negative for headache 10-point ROS otherwise negative.  ____________________________________________  PHYSICAL EXAM:  VITAL SIGNS: ED Triage Vitals  Enc Vitals Group     BP 01/03/15 1954 102/73 mmHg     Pulse Rate 01/03/15 1954 95     Resp 01/03/15 1954 18     Temp 01/03/15 1954 98 F (36.7 C)     Temp Source 01/03/15 1954 Oral     SpO2 01/03/15 1954 98 %     Weight --      Height --      Head Cir --      Peak Flow --      Pain Score 01/03/15 1955 0     Pain Loc --      Pain Edu? --      Excl. in [place]? --     Constitutional: Alert and oriented. Well appearing and in no distress. Eyes: Normal exam ENT   Head: Normocephalic and atraumatic.   Mouth/Throat: Mucous membranes are moist. Cardiovascular: Normal rate, regular rhythm. No murmur Respiratory: Normal respiratory effort without tachypnea nor retractions. Breath sounds are clear and equal bilaterally. No wheezes/rales/rhonchi. Gastrointestinal: Soft and  nontender. No distention.   Musculoskeletal: Nontender with normal range of motion in all extremities.  Neurologic:  Normal speech and language. No gross focal neurologic deficits Skin:  Skin is warm, dry and intact.  Psychiatric: Mood and affect are normal. Speech and behavior are normal.  ____________________________________________   INITIAL IMPRESSION / ASSESSMENT AND PLAN / ED COURSE  Pertinent labs & imaging results that were available during my care of the patient were reviewed by me and considered in my medical decision making (see chart for details).  Patient is calm and cooperative in the emergency department. Admits depression with suicidal ideation after his brother's funeral today. Patient is here voluntarily however given his history of suicide attempts, and his active suicidal ideation we will commit the patient for his own safety until he can be adequately evaluated by psychiatry.    ____________________________________________   FINAL CLINICAL IMPRESSION(S) / ED DIAGNOSES  Suicidal ideation Depression   Harvest Dark, MD 01/03/15 [phone]

## 2015-01-03 NOTE — BH Assessment (Signed)
Assessment Note  [name] is an 54 y.o. male. He reports that he brought himself to the ED because he had thoughts of harming himself.  He report symptoms of depresseion. He states that he went to a funeral for his brother. He reports on the way down the highway, he was riding by himself and began to wonder why his brother died instead of himself.   He reports that he planned to run his car into a median or off a bridge.  He denied having auditory or visual hallucinations.  He denied having homicidal ideation or intent.  Diagnosis:   Past Medical History:  Past Medical History  Diagnosis Date  . Diabetes mellitus     uncontrolled   . Hypertension   . Bipolar affective ([place])   . Depression     Past Surgical History  Procedure Laterality Date  . Knee surgery      b/l knees; pending total knee     Family History:  Family History  Problem Relation Age of Onset  . Diabetes    . Cancer Mother     uterus; deceased   . Hypertension    . Heart attack Father     alive   . Stroke Sister     12/2012 age 63 y.o    Social History:  reports that he has quit smoking. He does not have any smokeless tobacco history on file. He reports that he does not drink alcohol or use illicit drugs.  Additional Social History:  Alcohol / Drug Use History of alcohol / drug use?: No history of alcohol / drug abuse  CIWA: CIWA-Ar BP: 102/73 mmHg Pulse Rate: 95 COWS:    Allergies:  Allergies  Allergen Reactions  . Other Anaphylaxis    Allergy to GRITS , unknown exactly the specific ingredient he is allergic too  . Corn Oil Swelling    Home Medications:  (Not in a hospital admission)  OB/GYN Status:  No LMP for male patient.  General Assessment Data Location of Assessment: [facility] ED TTS Assessment: In system Is this a Tele or Face-to-Face Assessment?: Face-to-Face Is this an Initial Assessment or a Re-assessment for this encounter?: Initial Assessment Marital status: Married [place]  name: n/a Is patient pregnant?: No Pregnancy Status: No Living Arrangements: Spouse/significant other Can pt return to current living arrangement?: Yes Admission Status: Voluntary Is patient capable of signing voluntary admission?: Yes Referral Source: Self/Family/Friend Insurance type: Medicare  Medical Screening Exam ([place]) Medical Exam completed: Yes  Crisis Care Plan Living Arrangements: Spouse/significant other Name of Psychiatrist: None Name of Therapist: None  Education Status Is patient currently in school?: No Current Grade: n/a Highest grade of school patient has completed: Buyer, retail in Sports Medicine  Name of school: [employer] person: n/a  Risk to self with the past 6 months Suicidal Ideation: Yes-Currently Present Has patient been a risk to self within the past 6 months prior to admission? : Yes Suicidal Intent: No-Not Currently/Within Last 6 Months Has patient had any suicidal intent within the past 6 months prior to admission? : No Is patient at risk for suicide?: Yes Suicidal Plan?: Yes-Currently Present Has patient had any suicidal plan within the past 6 months prior to admission? : Yes Specify Current Suicidal Plan: Drive car into median or off bridge Access to Means: Yes Specify Access to Suicidal Means: Has his own vehicle What has been your use of drugs/alcohol within the last 12 months?: None reported Previous Attempts/Gestures: Yes  How many times?: 3 Other Self Harm Risks: None Triggers for Past Attempts: Unknown Intentional Self Injurious Behavior: None Family Suicide History: No Recent stressful life event(s): Loss (Comment) (death of brother) Persecutory voices/beliefs?: No Depression: Yes Depression Symptoms: Feeling worthless/self pity Substance abuse history and/or treatment for substance abuse?: No Suicide prevention information given to non-admitted patients: Not applicable  Risk to Others within the past 6  months Homicidal Ideation: No Does patient have any lifetime risk of violence toward others beyond the six months prior to admission? : No Thoughts of Harm to Others: No Current Homicidal Intent: No Current Homicidal Plan: No Describe Current Homicidal Plan: None Access to Homicidal Means: No Identified Victim: None reported History of harm to others?: Yes Assessment of Violence: None Noted Does patient have access to weapons?: No Criminal Charges Pending?: No Does patient have a court date: No Is patient on probation?: No  Psychosis Hallucinations: None noted Delusions: None noted  Mental Status Report Appearance/Hygiene: In scrubs, Unremarkable Eye Contact: Fair Motor Activity: Unremarkable Speech: Logical/coherent Level of Consciousness: Drowsy Mood: Depressed Affect: Sad Anxiety Level: Minimal Thought Processes: Coherent Judgement: Partial Orientation: Person, Place, Time, Situation Obsessive Compulsive Thoughts/Behaviors: None  Cognitive Functioning Concentration: Normal Memory: Recent Intact IQ: Average Insight: Fair Impulse Control: Fair Appetite: Fair Sleep: No Change Vegetative Symptoms: None  ADLScreening [facility] Assessment Services) Patient's cognitive ability adequate to safely complete daily activities?: Yes Patient able to express need for assistance with ADLs?: Yes Independently performs ADLs?: Yes (appropriate for developmental age)  Prior Inpatient Therapy Prior Inpatient Therapy: Yes Prior Therapy Dates: October 2016 Prior Therapy Facilty/Provider(s): [facility], and other can't remember the name Reason for Treatment: Suicidal ideation  Prior Outpatient Therapy Prior Outpatient Therapy: No Does patient have an ACCT team?: No Does patient have Intensive In-House Services?  : No Does patient have [facility] services? : No Does patient have P4CC services?: No  ADL Screening (condition at time of admission) Patient's cognitive ability adequate to  safely complete daily activities?: Yes Patient able to express need for assistance with ADLs?: Yes Independently performs ADLs?: Yes (appropriate for developmental age)       Abuse/Neglect Assessment (Assessment to be complete while patient is alone) Physical Abuse: Denies Verbal Abuse: Denies Sexual Abuse: Denies Exploitation of patient/patient's resources: Denies Self-Neglect: Denies Values / Beliefs Cultural Requests During Hospitalization: None Spiritual Requests During Hospitalization: None   Advance Directives (For Healthcare) Does patient have an advance directive?: No Would patient like information on creating an advanced directive?: No - patient declined information    Additional Information 1:1 In Past 12 Months?: No CIRT Risk: No Elopement Risk: No Does patient have medical clearance?: Yes     Disposition:  Disposition Initial Assessment Completed for this Encounter: Yes Disposition of Patient: Referred to (to be seen by the psychiatrist)  On Site Evaluation by:   Reviewed with Physician:    Elmer Bales 01/03/2015 11:32 PM

## 2015-01-03 NOTE — ED Notes (Signed)
Pt has one to one sitter, [place], [place]

## 2015-01-03 NOTE — ED Notes (Addendum)
Pt presents to ED with c/o suicidal ideation while driving on highway. Pt states he thought of running off high. Pt reports that he just came from his brother funeral. Pt reports he has prior suicidal attempt using gun/jumping off a bridge.

## 2015-01-03 NOTE — ED Notes (Signed)
Meal tray and water given.

## 2015-01-04 ENCOUNTER — Inpatient Hospital Stay
Admit: 2015-01-04 | Discharge: 2015-01-09 | DRG: 885 | Disposition: A | Discharge status: Home / Self Care | Payer: Medicare Other | Source: Intra-hospital | Attending: Psychiatry | Admitting: Psychiatry

## 2015-01-04 DIAGNOSIS — Z823 Family history of stroke: Secondary | ICD-10-CM | POA: Diagnosis not present

## 2015-01-04 DIAGNOSIS — E1165 Type 2 diabetes mellitus with hyperglycemia: Secondary | ICD-10-CM | POA: Diagnosis present

## 2015-01-04 DIAGNOSIS — M549 Dorsalgia, unspecified: Secondary | ICD-10-CM | POA: Diagnosis present

## 2015-01-04 DIAGNOSIS — F333 Major depressive disorder, recurrent, severe with psychotic symptoms: Principal | ICD-10-CM | POA: Diagnosis present

## 2015-01-04 DIAGNOSIS — G4700 Insomnia, unspecified: Secondary | ICD-10-CM | POA: Diagnosis present

## 2015-01-04 DIAGNOSIS — Z9889 Other specified postprocedural states: Secondary | ICD-10-CM | POA: Diagnosis not present

## 2015-01-04 DIAGNOSIS — Z833 Family history of diabetes mellitus: Secondary | ICD-10-CM

## 2015-01-04 DIAGNOSIS — E119 Type 2 diabetes mellitus without complications: Secondary | ICD-10-CM | POA: Diagnosis present

## 2015-01-04 DIAGNOSIS — Z79899 Other long term (current) drug therapy: Secondary | ICD-10-CM

## 2015-01-04 DIAGNOSIS — E1169 Type 2 diabetes mellitus with other specified complication: Secondary | ICD-10-CM | POA: Diagnosis present

## 2015-01-04 DIAGNOSIS — Z87891 Personal history of nicotine dependence: Secondary | ICD-10-CM

## 2015-01-04 DIAGNOSIS — F319 Bipolar disorder, unspecified: Secondary | ICD-10-CM | POA: Diagnosis present

## 2015-01-04 DIAGNOSIS — Z8049 Family history of malignant neoplasm of other genital organs: Secondary | ICD-10-CM

## 2015-01-04 DIAGNOSIS — I129 Hypertensive chronic kidney disease with stage 1 through stage 4 chronic kidney disease, or unspecified chronic kidney disease: Secondary | ICD-10-CM | POA: Diagnosis present

## 2015-01-04 DIAGNOSIS — IMO0002 Reserved for concepts with insufficient information to code with codable children: Secondary | ICD-10-CM | POA: Diagnosis present

## 2015-01-04 DIAGNOSIS — Z794 Long term (current) use of insulin: Secondary | ICD-10-CM | POA: Diagnosis not present

## 2015-01-04 DIAGNOSIS — I10 Essential (primary) hypertension: Secondary | ICD-10-CM | POA: Diagnosis present

## 2015-01-04 DIAGNOSIS — Z8249 Family history of ischemic heart disease and other diseases of the circulatory system: Secondary | ICD-10-CM

## 2015-01-04 DIAGNOSIS — R45851 Suicidal ideations: Secondary | ICD-10-CM | POA: Diagnosis present

## 2015-01-04 DIAGNOSIS — F1420 Cocaine dependence, uncomplicated: Secondary | ICD-10-CM | POA: Diagnosis present

## 2015-01-04 DIAGNOSIS — R440 Auditory hallucinations: Secondary | ICD-10-CM | POA: Diagnosis present

## 2015-01-04 DIAGNOSIS — F329 Major depressive disorder, single episode, unspecified: Secondary | ICD-10-CM | POA: Diagnosis not present

## 2015-01-04 HISTORY — DX: Major depressive disorder, recurrent, severe with psychotic symptoms: F33.3

## 2015-01-04 LAB — GLUCOSE, CAPILLARY: Glucose-Capillary: 306 mg/dL — ABNORMAL HIGH (ref 65–99)

## 2015-01-04 MED ORDER — ALUM & MAG HYDROXIDE-SIMETH 200-200-20 MG/5ML PO SUSP: 30.0000 mL | ORAL | Status: DC | PRN

## 2015-01-04 MED ORDER — MAGNESIUM HYDROXIDE 400 MG/5ML PO SUSP: 30.0000 mL | Freq: Every day | ORAL | Status: DC | PRN

## 2015-01-04 MED ORDER — ACETAMINOPHEN 325 MG PO TABS: 650.0000 mg | ORAL_TABLET | Freq: Four times a day (QID) | ORAL | Status: DC | PRN

## 2015-01-04 MED ORDER — METFORMIN HCL 500 MG PO TABS
1000.0000 mg | ORAL_TABLET | Freq: Two times a day (BID) | ORAL | Status: DC
  Administered 2015-01-05 – 2015-01-09 (×10): 1000 mg via ORAL
  Filled 2015-01-04 (×10): qty 2

## 2015-01-04 MED ORDER — INSULIN ASPART PROT & ASPART (70-30 MIX) 100 UNIT/ML ~~LOC~~ SUSP
40.0000 [IU] | Freq: Every day | SUBCUTANEOUS | Status: DC
  Administered 2015-01-05 – 2015-01-09 (×5): 40 [IU] via SUBCUTANEOUS
  Filled 2015-01-04: qty 40
  Filled 2015-01-04 (×2): qty 1
  Filled 2015-01-04: qty 40

## 2015-01-04 MED ORDER — INSULIN ASPART PROT & ASPART (70-30 MIX) 100 UNIT/ML ~~LOC~~ SUSP: 35.0000 [IU] | Freq: Every day | SUBCUTANEOUS | Status: DC

## 2015-01-04 MED ORDER — METFORMIN HCL 500 MG PO TABS
1000.0000 mg | ORAL_TABLET | Freq: Two times a day (BID) | ORAL | Status: DC
  Administered 2015-01-04: 1000 mg via ORAL
  Filled 2015-01-04: qty 2

## 2015-01-04 MED ORDER — BUPROPION HCL ER (XL) 150 MG PO TB24
150.0000 mg | ORAL_TABLET | Freq: Every day | ORAL | Status: DC
  Administered 2015-01-04: 150 mg via ORAL
  Filled 2015-01-04: qty 1

## 2015-01-04 MED ORDER — BUPROPION HCL ER (XL) 150 MG PO TB24: 150.0000 mg | ORAL_TABLET | Freq: Every day | ORAL | Status: DC

## 2015-01-04 MED ORDER — INSULIN ASPART PROT & ASPART (70-30 MIX) 100 UNIT/ML ~~LOC~~ SUSP
40.0000 [IU] | Freq: Every day | SUBCUTANEOUS | Status: DC
  Administered 2015-01-04: 40 [IU] via SUBCUTANEOUS
  Filled 2015-01-04: qty 40
  Filled 2015-01-04: qty 10

## 2015-01-04 MED ORDER — LISINOPRIL 20 MG PO TABS
20.0000 mg | ORAL_TABLET | Freq: Every day | ORAL | Status: DC
  Administered 2015-01-04: 20 mg via ORAL
  Filled 2015-01-04: qty 1

## 2015-01-04 MED ORDER — INSULIN ASPART PROT & ASPART (70-30 MIX) 100 UNIT/ML ~~LOC~~ SUSP
35.0000 [IU] | Freq: Every day | SUBCUTANEOUS | Status: DC
  Administered 2015-01-05 – 2015-01-09 (×5): 35 [IU] via SUBCUTANEOUS
  Filled 2015-01-04: qty 40
  Filled 2015-01-04: qty 35
  Filled 2015-01-04 (×3): qty 1
  Filled 2015-01-04: qty 35

## 2015-01-04 MED ORDER — LISINOPRIL 20 MG PO TABS: 20.0000 mg | ORAL_TABLET | Freq: Every day | ORAL | Status: DC

## 2015-01-04 NOTE — ED Notes (Signed)
BP recheck  - I spoke with Marcelene Butte  Lisinopril 20mg  contraindicated due to corn oil allergy  - i will continue to monitor BP closely

## 2015-01-04 NOTE — ED Notes (Signed)
Patient observed lying in bed with eyes closed  Even, unlabored respirations observed   NAD pt appears to be sleeping  I will continue to monitor along with every 15 minute visual observations and ongoing security camera monitoring    

## 2015-01-04 NOTE — ED Notes (Signed)
Pt. Noted in room awake watching the tv. Verbalizing that, he is unable to void; and c/o having hemmoroids and stating, "I'm hurting.". No distress or abnormal behavior noted. Will continue to monitor with security cameras. Q 15 minute rounds continue.

## 2015-01-04 NOTE — Progress Notes (Signed)
Inpatient Diabetes Program Recommendations  AACE/ADA: New Consensus Statement on Inpatient Glycemic Control (2015)  Target Ranges:  Prepandial:   less than 140 mg/dL      Peak postprandial:   less than 180 mg/dL (1-2 hours)      Critically ill patients:  140 - 180 mg/dL  Results for SAYEED, LIPE (MRN [id number]) as of 01/04/2015 11:38  Ref. Range 01/03/2015 21:01  Glucose-Capillary Latest Ref Range: 65-99 mg/dL 195 (H)  Results for KUSHAGRA, BIGHAM (MRN [id number]) as of 01/04/2015 11:38  Ref. Range 01/03/2015 20:13  Glucose Latest Ref Range: 65-99 mg/dL 246 (H)    Review of Glycemic Control  Diabetes history: DM2 Outpatient Diabetes medications: Metformin 1000 mg BID Current orders for Inpatient glycemic control: None  Inpatient Diabetes Program Recommendations: Correction (SSI): While holding in the ED, please consider ordering CBGs with Novolog correction scale ACHS. Oral Agents: Please consider ordering Metformin 1000 mg BID (as taken as an outpatient). Diet: Please consider changing diet from Regular to Carb Modified diet.  Thanks, Barnie Alderman, RN, MSN, CDE Diabetes Coordinator Inpatient Diabetes Program [phone] (Team Pager from [place] to [place]) [phone] ([facility] office) [phone] [facility] office) [phone] [facility] office)

## 2015-01-04 NOTE — ED Notes (Signed)
Pt. To ED-BHU from ED via wheelchair; and ambulated to  room #2 . Report from RN. Pt. Is alert and oriented, warm and dry; and c/o pain at both knees. Per patient, "A steel drum fell on my knees back in "2011; while I was at work; I need both of my knees operated on; both knees need to be replaced; I went to [employer] To my brother's funeral; I wanted to drive off the highway; I kept saying to myself; I should have been the one who died instead of my brother.". Pt. Verbalizes having SI denies HI, and AVH. Pt. Calm and cooperative. Pt. Made aware of security cameras and Q15 minute rounds. Pt. Encouraged to let Nursing staff know of any concerns or needs. Pt has been oriented to the [place].

## 2015-01-04 NOTE — ED Notes (Signed)
ED BHU [place] Is the patient under IVC or is there intent for IVC: Yes.   Is the patient medically cleared: Yes.   Is there vacancy in the ED BHU: Yes.   Is the population mix appropriate for patient: ? Is the patient awaiting placement in inpatient or outpatient setting:  Consult pending   Has the patient had a psychiatric consult: Yes.   Survey of unit performed for contraband, proper placement and condition of furniture, tampering with fixtures in bathroom, shower, and each patient room: Yes.  ; Findings:  APPEARANCE/BEHAVIOR Calm and cooperative NEURO ASSESSMENT Orientation: oriented x3  Denies pain Hallucinations: No.None noted (Hallucinations) Speech: Normal Gait: normal RESPIRATORY ASSESSMENT Even  Unlabored respirations  CARDIOVASCULAR ASSESSMENT Pulses equal   regular rate  Skin warm and dry   GASTROINTESTINAL ASSESSMENT no GI complaint EXTREMITIES Full ROM  PLAN OF CARE Provide calm/safe environment. Vital signs assessed twice daily. ED BHU Assessment once each 12-hour shift. Collaborate with intake RN daily or as condition indicates. Assure the ED provider has rounded once each shift. Provide and encourage hygiene. Provide redirection as needed. Assess for escalating behavior; address immediately and inform ED provider.  Assess family dynamic and appropriateness for visitation as needed: Yes.  ; If necessary, describe findings:  Educate the patient/family about BHU procedures/visitation: Yes.  ; If necessary, describe findings:

## 2015-01-04 NOTE — ED Notes (Signed)
BEHAVIORAL HEALTH ROUNDING Patient sleeping: No. Patient alert and oriented: yes Behavior appropriate: Yes.  ; If no, describe:  Nutrition and fluids offered: yes Toileting and hygiene offered: Yes  Sitter present: q15 minute observations and security camera monitoring Law enforcement present: Yes  ODS  He is to be admitted to [facility] when staff/bed become available

## 2015-01-04 NOTE — ED Notes (Addendum)
BP elevated this am  -  Assessment completed

## 2015-01-04 NOTE — ED Notes (Signed)
BEHAVIORAL HEALTH ROUNDING Patient sleeping: No. Patient alert and oriented: yes Behavior appropriate: Yes.  ; If no, describe:  Nutrition and fluids offered: yes Toileting and hygiene offered: Yes  Sitter present: q15 minute observations and security camera monitoring Law enforcement present: Yes  ODS  

## 2015-01-04 NOTE — Consult Note (Signed)
[facility] Psychiat[facility] Consult   Reason for Consult:  Consult for this 54 year old man with a history of recurrent depression who comes into the hospital reporting suicidal ideation Referring Physician:  Marcelene Butte Patient Identification: [name] MRN:  [id number] Principal Diagnosis: Depression, major, recurrent, severe with psychosis ([place]) Diagnosis:   Patient Active Problem List   Diagnosis Date Noted  . Cocaine abuse [F14.10] 01/04/2015  . Depression, major, recurrent, severe with psychosis ([name]) [F33.3] 08/29/2013  . Suicidal ideations [R45.851] 08/28/2013  . Chest pain [R07.9] 06/30/2013  . HTN (hypertension) [I10] 06/30/2013  . Uncontrolled diabetes mellitus ([name]) [E11.65] 06/30/2013  . Bipolar affect, depressed ([place]) [F31.30] 05/13/2011    Total Time spent with patient: 1 hour  Subjective:   [name] is a 54 y.o. male patient admitted with "I just started having bad thoughts again".  HPI:  Information from the patient and the chart. Patient interviewed. Chart reviewed. Patient came to the hospital stating that he had been driving home to [place] after attending a funeral for his brother when he found himself having thoughts of killing himself. He came on very quickly. He was thinking of driving his car off the road. He is still feeling depressed and anxious and overwhelmed. Patient denies that he's been using or alcohol or cocaine recently. He is not currently taking any psychiatric medicine. He says he has been compliant with his medical medicine. He hasn't slept well the last couple days. Appetite has not been good. He also says he's having auditory hallucinations telling him to hurt himself.  Social history: Patient lives in the [place] area. He is married. Gets disability.  Medical history: Diabetes, on insulin. High blood pressure.  Substance abuse history: Denies any history of alcohol abuse but has a history of cocaine abuse with documented cocaine abuse as  recently as about a month ago.  Past Psychiatric History: Patient somewhat minimize this. He did admit that he had past suicide attempts on more than one occasion but said that he had only been in a psychiatric hospital at [facility]. According to our chart it looks like he's been to the [facility] system several times with similar symptoms. He says that he used to take Zoloft which made him sick to his stomach and he thinks he used to take Wellbutrin. He can't remember any other medicines.  Risk to Self: Suicidal Ideation: Yes-Currently Present Suicidal Intent: No-Not Currently/Within Last 6 Months Is patient at risk for suicide?: Yes Suicidal Plan?: Yes-Currently Present Specify Current Suicidal Plan: Drive car into median or off bridge Access to Means: Yes Specify Access to Suicidal Means: Has his own vehicle What has been your use of drugs/alcohol within the last 12 months?: None reported How many times?: 3 Other Self Harm Risks: None Triggers for Past Attempts: Unknown Intentional Self Injurious Behavior: None Risk to Others: Homicidal Ideation: No Thoughts of Harm to Others: No Current Homicidal Intent: No Current Homicidal Plan: No Describe Current Homicidal Plan: None Access to Homicidal Means: No Identified Victim: None reported History of harm to others?: Yes Assessment of Violence: None Noted Does patient have access to weapons?: No Criminal Charges Pending?: No Does patient have a court date: No Prior Inpatient Therapy: Prior Inpatient Therapy: Yes Prior Therapy Dates: October 2016 Prior Therapy Facilty/Provider(s): [facility], and other can't remember the name Reason for Treatment: Suicidal ideation Prior Outpatient Therapy: Prior Outpatient Therapy: No Does patient have an ACCT team?: No Does patient have Intensive In-House Services?  : No Does  patient have [facility] services? : No Does patient have P4CC services?: No  Past Medical History:  Past Medical History   Diagnosis Date  . Diabetes mellitus     uncontrolled   . Hypertension   . Bipolar affective ([place])   . Depression     Past Surgical History  Procedure Laterality Date  . Knee surgery      b/l knees; pending total knee    Family History:  Family History  Problem Relation Age of Onset  . Diabetes    . Cancer Mother     uterus; deceased   . Hypertension    . Heart attack Father     alive   . Stroke Sister     12/2012 age 4 y.o   Family Psychiatric  History: Patient denies that being any family history of mental illness Social History:  History  Alcohol Use No     History  Drug Use No    Social History   Social History  . Marital Status: Legally Separated    Spouse Name: N/A  . Number of Children: N/A  . Years of Education: N/A   Social History Main Topics  . Smoking status: Former Research scientist (life sciences)  . Smokeless tobacco: None  . Alcohol Use: No  . Drug Use: No  . Sexual Activity: Yes    Birth Control/ Protection: Condom   Other Topics Concern  . 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 Other Anaphylaxis    Allergy to GRITS , unknown exactly the specific ingredient he is allergic too  . Corn Oil Swelling    Labs:  Results for orders placed or performed during the hospital encounter of 01/03/15 (from the past 48 hour(s))  Comprehensive metabolic panel     Status: Abnormal   Collection Time: 01/03/15  8:13 PM  Result Value Ref Range   Sodium 136 135 - 145 mmol/L   Potassium 3.9 3.5 - 5.1 mmol/L   Chloride 102 101 - 111 mmol/L   CO2 25 22 - 32 mmol/L    Glucose, Bld 246 (H) 65 - 99 mg/dL   BUN 20 6 - 20 mg/dL   Creatinine, Ser 1.15 0.61 - 1.24 mg/dL   Calcium 9.8 8.9 - 10.3 mg/dL   Total Protein 7.9 6.5 - 8.1 g/dL   Albumin 4.0 3.5 - 5.0 g/dL   AST 18 15 - 41 U/L   ALT 13 (L) 17 - 63 U/L   Alkaline Phosphatase 61 38 - 126 U/L   Total Bilirubin 0.5 0.3 - 1.2 mg/dL   GFR calc non Af Amer >60 >60 mL/min   GFR calc Af Amer >60 >60 mL/min    Comment: (NOTE) The eGFR has been calculated using the CKD EPI equation. This calculation has not been validated in all clinical situations. eGFR's persistently <60 mL/min signify possible Chronic  Kidney Disease.    Anion gap 9 5 - 15  Ethanol     Status: None   Collection Time: 01/03/15  8:13 PM  Result Value Ref Range   Alcohol, Ethyl (B) <5 <5 mg/dL    Comment:        LOWEST DETECTABLE LIMIT FOR SERUM ALCOHOL IS 5 mg/dL FOR MEDICAL PURPOSES ONLY   CBC with Diff     Status: Abnormal   Collection Time: 01/03/15  8:13 PM  Result Value Ref Range   WBC 3.6 (L) 3.8 - 10.6 K/uL   RBC 5.12 4.40 - 5.90 MIL/uL   Hemoglobin 13.4 13.0 - 18.0 g/dL   HCT 40.8 40.0 - 52.0 %   MCV 79.7 (L) 80.0 - 100.0 fL   MCH 26.2 26.0 - 34.0 pg   MCHC 32.9 32.0 - 36.0 g/dL   RDW 14.4 11.5 - 14.5 %   Platelets 170 150 - 440 K/uL   Neutrophils Relative % 30 %   Neutro Abs 1.1 (L) 1.4 - 6.5 K/uL   Lymphocytes Relative 57 %   Lymphs Abs 2.0 1.0 - 3.6 K/uL   Monocytes Relative 12 %   Monocytes Absolute 0.4 0.2 - 1.0 K/uL   Eosinophils Relative 1 %   Eosinophils Absolute 0.0 0 - 0.7 K/uL   Basophils Relative 0 %   Basophils Absolute 0.0 0 - 0.1 K/uL  Glucose, capillary     Status: Abnormal   Collection Time: 01/03/15  9:01 PM  Result Value Ref Range   Glucose-Capillary 195 (H) 65 - 99 mg/dL   Comment 1 Notify RN     No current facility-administered medications for this encounter.   Current Outpatient Prescriptions  Medication Sig Dispense Refill  . buPROPion (WELLBUTRIN) 75 MG tablet Take 3 tablets  (225 mg total) by mouth daily.    . DULoxetine (CYMBALTA) 60 MG capsule Take 1 capsule (60 mg total) by mouth daily.    Marland Kitchen gabapentin (NEURONTIN) 400 MG capsule Take 1 capsule (400 mg total) by mouth 3 (three) times daily.    Marland Kitchen LEVEMIR FLEXTOUCH 100 UNIT/ML Pen Inject 20 Units into the skin at bedtime. TO BE MANAGED BY HOSPITAL WITH DOSING 15 mL   . lisinopril (PRINIVIL,ZESTRIL) 20 MG tablet Take 1 tablet (20 mg total) by mouth daily. For hypertension 30 tablet 0  . metFORMIN (GLUCOPHAGE) 1000 MG tablet Take 1 tablet (1,000 mg total) by mouth 2 (two) times daily with a meal. For diabetes control    . nicotine (NICODERM CQ - DOSED IN MG/24 HOURS) 21 mg/24hr patch Place 1 patch (21 mg total) onto the skin daily. 28 patch 0    Musculoskeletal: Strength & Muscle Tone: within normal limits Gait & Station: normal Patient leans: N/A  Psychiatric Specialty Exam: Review of Systems  Constitutional: Negative.   HENT: Negative.   Eyes: Negative.   Respiratory: Negative.   Cardiovascular: Negative.   Gastrointestinal: Negative.   Musculoskeletal: Negative.   Skin: Negative.   Neurological: Negative.   Psychiatric/Behavioral: Positive for depression, suicidal ideas, hallucinations and memory loss. Negative for substance abuse. The patient is nervous/anxious and has insomnia.     Blood pressure 147/85, pulse 96, temperature 98.3 F (36.8 C), temperature source Oral, resp. rate 18, SpO2 100 %.There is no weight on file to calculate BMI.  General Appearance: Disheveled  Eye Contact::  Poor  Speech:  Slow  Volume:  Decreased  Mood:  Depressed  Affect:  Depressed  Thought Process:  Linear  Orientation:  Full (Time, Place, and Person)  Thought Content:  Negative  Suicidal Thoughts:  Yes.  with intent/plan  Homicidal Thoughts:  No  Memory:  Immediate;   Good Recent;   Fair Remote;   Fair  Judgement:  Impaired  Insight:  Shallow  Psychomotor Activity:  Decreased  Concentration:  Poor  Recall:   Poor  Fund of Knowledge:Fair  Language: Fair  Akathisia:  No  Handed:  Right  AIMS (if indicated):     Assets:  Communication Skills Desire for Improvement Financial Resources/Insurance Housing Intimacy Social Support  ADL's:  Intact  Cognition: WNL  Sleep:      Treatment Plan Summary: Daily contact with patient to assess and evaluate symptoms and progress in treatment, Medication management and Plan Patient with history of recurrent depression with psychotic features currently having auditory hallucinations and active suicidal thoughts with thoughts of crashing his car. Labs are stable although we don't have his urine drug screen back. Possible drug abuse involved. Patient is very depressed and withdrawn. He will be admitted to the psychiatric hospital and put back on his regular medical medicines and I will restart the Wellbutrin. Supportive counseling completed. Case reviewed with emergency room physician.  Disposition: Recommend psychiatric Inpatient admission when medically cleared. Supportive therapy provided about ongoing stressors. Discussed crisis plan, support from social network, calling 911, coming to the Emergency Department, and calling Suicide Hotline.  Franklyn Sriya Kroeze 01/04/2015 4:23 PM

## 2015-01-04 NOTE — ED Provider Notes (Signed)
-----------------------------------------   6:26 AM on 01/04/2015 -----------------------------------------   Blood pressure 102/73, pulse 95, temperature 98 F (36.7 C), temperature source Oral, resp. rate 18, SpO2 98 %.  The patient had no acute events since last update.  Calm and cooperative at this time.  Disposition is pending per Psychiatry/Behavioral Medicine team recommendations.     Paulette Blanch, MD 01/04/15 [phone]

## 2015-01-04 NOTE — ED Notes (Signed)
No am meds ordered at this time  He is pending a psychiatric consult  Patient observed lying in bed with eyes closed  Even, unlabored respirations observed   NAD pt appears to be sleeping  I will continue to monitor along with every 15 minute visual observations and ongoing security camera monitoring

## 2015-01-04 NOTE — ED Notes (Signed)
Breakfast provided   Patient observed lying in bed with eyes closed  Even, unlabored respirations observed   NAD pt appears to be sleeping  I will continue to monitor along with every 15 minute visual observations and ongoing security camera monitoring

## 2015-01-04 NOTE — Progress Notes (Signed)
Pt. is to be admitted to [facility] by Dr. Weber Cooks. Attending Physician will be Dr. Bary Leriche  Pt. has been assigned to room 324, by [facility].  Intake Paper Work has been signed and placed on pt. chart. ER staff [facility] Dr. Marcelene Butte, ER MD; Amy Patient's Nurse & Renee Patient Access) have been made aware of the admission.   01/04/2015 Con Memos, MS, [place], LPCA Therapeutic Triage Specialist

## 2015-01-04 NOTE — ED Notes (Signed)
Pt. Noted in room awake watching the tv. No complaints or concerns voiced. No distress or abnormal behavior noted. Will continue to monitor with security cameras. Q 15 minute rounds continue.

## 2015-01-04 NOTE — ED Notes (Signed)
Drink provided  [place] has consulted - awaiting plan

## 2015-01-04 NOTE — ED Notes (Signed)
Pt. Noted in room sleeping;. No complaints or concerns voiced. No distress or abnormal behavior noted. Will continue to monitor with security cameras. Q 15 minute rounds continue. 

## 2015-01-04 NOTE — Tx Team (Signed)
Initial Interdisciplinary Treatment Plan   PATIENT STRESSORS: Loss of Brother  Occupational concerns   PATIENT STRENGTHS: Average or above average intelligence Communication skills Supportive family/friends   PROBLEM LIST: Problem List/Patient Goals Date to be addressed Date deferred Reason deferred Estimated date of resolution  Bipolar  11/9           Suicidal Ideas  11/9                                           DISCHARGE CRITERIA:  Adequate post-discharge living arrangements Improved stabilization in mood, thinking, and/or behaviorB  PRELIMINARY DISCHARGE PLAN: Attend aftercare/continuing care group Return to previous living arrangement  PATIENT/FAMIILY INVOLVEMENT: This treatment plan has been presented to and reviewed with the patient, [name], and/or family member, .  The patient and family have been given the opportunity to ask questions and make suggestions.  Raul Del 01/04/2015, 7:18 PM

## 2015-01-04 NOTE — ED Notes (Signed)
Report received from Marrion Coy., RN. Pt. Alert and oriented verbalizes having SI denies HI, AVH c/o pain at both knees and stating, "nothing works for the pain."  Pt. Instructed to come to me with problems or concerns.Will continue to monitor for safety via security cameras and Q 15 minute checks.

## 2015-01-04 NOTE — ED Notes (Signed)
Pm meds administered as ordered    I have called report to [facility] - 3/3 bags of belongings to transfer to [place] [facility] with him

## 2015-01-04 NOTE — ED Notes (Signed)
Supper provided along with an extra drink  Pt observed with no unusual behavior  Appropriate to stimulation  No verbalized needs or concerns at this time  NAD assessed  Continue to monitor 

## 2015-01-04 NOTE — ED Notes (Signed)
BEHAVIORAL HEALTH ROUNDING Patient sleeping: Yes.   Patient alert and oriented: eyes closed  Appears asleep Behavior appropriate: Yes.  ; If no, describe:  Nutrition and fluids offered: Yes  Toileting and hygiene offered: sleeping Sitter present: q 15 minute observations and security camera monitoring Law enforcement present: yes  ODS  ENVIRONMENTAL ASSESSMENT Potentially harmful objects out of patient reach: Yes.   Personal belongings secured: Yes.   Patient dressed in hospital provided attire only: Yes.   Plastic bags out of patient reach: Yes.   Patient care equipment (cords, cables, call bells, lines, and drains) shortened, removed, or accounted for: Yes.   Equipment and supplies removed from bottom of stretcher: Yes.   Potentially toxic materials out of patient reach: Yes.   Sharps container removed or out of patient reach: Yes.   

## 2015-01-04 NOTE — ED Notes (Signed)
Lunch provided along with an extra drink  Pt observed with no unusual behavior  Appropriate to stimulation  No verbalized needs or concerns at this time  NAD assessed  Continue to monitor 

## 2015-01-04 NOTE — Progress Notes (Signed)
Patient with depressed affect, cooperative behavior with meals, meds and plan of care. No SI/HI at this time. Skin check performed and no wounds or bruises noted. Patient with no contraband found on body. Patient vs monitored and recorded. Patient reports he "drove up [place] to his brother's funeral and on the way home back to [place] he became overwhelmed with thoughts of SI by motor vehicle". Patient states "I called my wife and she told me to pull into the [facility] hospital". No distress, no complaint. Safety maintained at this time.

## 2015-01-05 DIAGNOSIS — F333 Major depressive disorder, recurrent, severe with psychotic symptoms: Principal | ICD-10-CM

## 2015-01-05 LAB — GLUCOSE, CAPILLARY
Glucose-Capillary: 256 mg/dL — ABNORMAL HIGH (ref 65–99)
Glucose-Capillary: 196 mg/dL — ABNORMAL HIGH (ref 65–99)
Glucose-Capillary: 148 mg/dL — ABNORMAL HIGH (ref 65–99)
Glucose-Capillary: 117 mg/dL — ABNORMAL HIGH (ref 65–99)

## 2015-01-05 MED ORDER — INSULIN ASPART 100 UNIT/ML ~~LOC~~ SOLN
0.0000 [IU] | Freq: Three times a day (TID) | SUBCUTANEOUS | Status: DC
  Administered 2015-01-05: 5 [IU] via SUBCUTANEOUS
  Administered 2015-01-05: 2 [IU] via SUBCUTANEOUS
  Administered 2015-01-06 – 2015-01-07 (×4): 1 [IU] via SUBCUTANEOUS
  Administered 2015-01-08 (×2): 2 [IU] via SUBCUTANEOUS
  Filled 2015-01-05 (×11): qty 1

## 2015-01-05 MED ORDER — RISPERIDONE 1 MG PO TABS
1.0000 mg | ORAL_TABLET | Freq: Every day | ORAL | Status: DC
  Administered 2015-01-05: 1 mg via ORAL
  Filled 2015-01-05 (×2): qty 1

## 2015-01-05 MED ORDER — LISINOPRIL 20 MG PO TABS
20.0000 mg | ORAL_TABLET | Freq: Every day | ORAL | Status: DC
  Administered 2015-01-05 – 2015-01-09 (×5): 20 mg via ORAL
  Filled 2015-01-05 (×5): qty 1

## 2015-01-05 MED ORDER — IBUPROFEN 600 MG PO TABS
600.0000 mg | ORAL_TABLET | Freq: Four times a day (QID) | ORAL | Status: DC | PRN
  Administered 2015-01-08 – 2015-01-09 (×2): 600 mg via ORAL
  Filled 2015-01-05 (×2): qty 1

## 2015-01-05 MED ORDER — ZOLPIDEM TARTRATE 5 MG PO TABS
10.0000 mg | ORAL_TABLET | Freq: Every day | ORAL | Status: DC
  Administered 2015-01-06: 10 mg via ORAL
  Filled 2015-01-05 (×4): qty 2

## 2015-01-05 MED ORDER — BUPROPION HCL ER (XL) 150 MG PO TB24
150.0000 mg | ORAL_TABLET | Freq: Every day | ORAL | Status: DC
  Administered 2015-01-05 – 2015-01-09 (×5): 150 mg via ORAL
  Filled 2015-01-05 (×5): qty 1

## 2015-01-05 NOTE — Progress Notes (Signed)
Recreation Therapy Notes  INPATIENT RECREATION THERAPY ASSESSMENT  Patient Details Name: [name] MRN: [id number] DOB: [birth year] Today's Date: January 27, 2015  Patient Stressors: Death (Brother passes away recently)  Coping Skills:   Art/Dance, Music, Sports, Other (Comment), Substance Abuse ([place])  Personal Challenges: Concentration, Problem-Solving, Stress Management, Substance Abuse  Leisure Interests (2+):  Music - Listen, Individual - Other (Comment) (Read)  Awareness of Community Resources:  Yes  Community Resources:  [employer], Other (Comment) [employer])  Current Use: No  If no, Barriers?: Other (Comment) (Does not have a need to)  Patient Strengths:  No  Patient Identified Areas of Improvement:  All of them  Current Recreation Participation:  Watch movies  Patient Goal for Hospitalization:  I don't know  [place] of Residence:  [place] of Residence:  [place]   Current SI (including self-harm):  Yes  Current HI:  No ("Off and on")  Consent to Intern Participation: N/A   Leonette Monarch, LRT/CTRS Jan 27, 2015, 2:17 PM

## 2015-01-05 NOTE — Progress Notes (Signed)
Recreation Therapy Notes  Date: 11.10.16 Time: 3:00 pm Location: Craft Room  Group Topic: Self-expression, Coping Skills  Goal Area(s) Addresses:  Patient will effectively use art as a means of self-expression. Patient will recognize positive benefit of self-expression. Patient will be able to identify one emotion experienced during group discussion. Patient will identify use of art as a coping skill.  Behavioral Response: Did not attend   Intervention: Two Faces of Me  Activity: Patients were given a blank face worksheet and instructed to draw a line down the middle of the worksheet. On one side, patients were instructed to draw how they felt when they were admitted to the hospital and on the other side they were instructed to draw how they want to feel when they are d/c from the hospital.  Education: LRT educated patients on how art is a good Technical sales engineer.  Education Outcome: Patient did not attend group.   Clinical Observations/Feedback: Patient did not attend group.  Leonette Monarch, LRT/CTRS 01/05/2015 4:05 PM

## 2015-01-05 NOTE — Tx Team (Signed)
Initial Interdisciplinary Treatment Plan   PATIENT STRESSORS: Loss of Brother   PATIENT STRENGTHS: Ability for insight Average or above average intelligence General fund of knowledge Motivation for treatment/growth Supportive family/friends   PROBLEM LIST: Problem List/Patient Goals Date to be addressed Date deferred Reason deferred Estimated date of resolution  Depression 01/05/15     Suicidal Ideation 01/05/15                                                DISCHARGE CRITERIA:  Improved stabilization in mood, thinking, and/or behavior  PRELIMINARY DISCHARGE PLAN: Outpatient therapy  PATIENT/FAMIILY INVOLVEMENT: This treatment plan has been presented to and reviewed with the patient, BRYCEON WOLAVER, and/or family member.  The patient and family have been given the opportunity to ask questions and make suggestions.  Nash Mantis [facility] 01/05/2015, 5:02 AM

## 2015-01-05 NOTE — Progress Notes (Signed)
D: Patient has been irritable and negatively focused today. He c/o that he isn't getting enough food to eat on a CHO modified diet. He has primarily been focused on food and even when additional items were ordered for him, he complained that it wasn't enough. Affect is flat. No SI presently. A: On 15 minute checks for safety. Given meds. Monitored CBGs. R: Irritable but cooperative.

## 2015-01-05 NOTE — Progress Notes (Signed)
Inpatient Diabetes Program Recommendations  AACE/ADA: New Consensus Statement on Inpatient Glycemic Control (2015)  Target Ranges:  Prepandial:   less than 140 mg/dL      Peak postprandial:   less than 180 mg/dL (1-2 hours)      Critically ill patients:  140 - 180 mg/dL   Review of Glycemic Control  Diabetes history: Type 2 Outpatient Diabetes medications: Levemir 20 units qhs, Metformin 1000mg  bid, Novolog mix 70/30 35 units qam and Novolog 40 units qpm with supper Current orders for Inpatient glycemic control: Novolog mix 70/30 35 units qam and 40 units pre-supper, Novolog 0-9 units tid  Inpatient Diabetes Program Recommendations: Spoke to the RN Abigail Butts to clarify the above information.  Although the home meds in this chart are documented as Levemir and Metfromin only, Abigail Butts has reassured me that what I have documented above is correct.  I can hear the patient in the back ground telling her these doses.  I can hear the patient tell her that his last A1C was 12% and that he sees the MD every 1 1/2 months.  Continue Novolog 70/30 mix and Novolog correction as ordered- may need to add Levemir. Will follow   Gentry Fitz, RN, [place], [place], CDE Diabetes Coordinator Inpatient Diabetes Program  [phone] (Team Pager) [phone] ([place]) 01/05/2015 12:23 PM

## 2015-01-05 NOTE — BHH Group Notes (Signed)
[facility] Group Notes:  (Nursing/MHT/Case Management/Adjunct)  Date:  01/05/2015  Time:  1:52 PM  Type of Therapy:  Psychoeducational Skills  Participation Level:  Minimal  Participation Quality:  Appropriate  Affect:  Flat  Cognitive:  Appropriate  Insight:  Lacking  Engagement in Group:  Lacking  Modes of Intervention:  Activity  Summary of Progress/Problems:  [name] [name] 01/05/2015, 1:52 PM

## 2015-01-05 NOTE — BHH Suicide Risk Assessment (Signed)
[facility] Admission Suicide Risk Assessment   Nursing information obtained from:  Patient Demographic factors:  Male Current Mental Status:  NA Loss Factors:  Loss of significant relationship Historical Factors:  NA Risk Reduction Factors:  Positive coping skills or problem solving skills Total Time spent with patient: 1 hour Principal Problem: <principal problem not specified> Diagnosis:   Patient Active Problem List   Diagnosis Date Noted  . Cocaine abuse [F14.10] 01/04/2015  . Severe recurrent major depression with psychotic features ([place]) [F33.3] 01/04/2015  . Depression, major, recurrent, severe with psychosis ([place]) [F33.3] 08/29/2013  . Suicidal ideations [R45.851] 08/28/2013  . Chest pain [R07.9] 06/30/2013  . HTN (hypertension) [I10] 06/30/2013  . Uncontrolled diabetes mellitus ([place]) [E11.65] 06/30/2013  . Bipolar affect, depressed ([place]) [F31.30] 05/13/2011     Continued Clinical Symptoms:  Alcohol Use Disorder Identification Test Final Score (AUDIT): 0 The "Alcohol Use Disorders Identification Test", Guidelines for Use in Primary Care, Second Edition.  World Pharmacologist [facility]). Score between 0-7:  no or low risk or alcohol related problems. Score between 8-15:  moderate risk of alcohol related problems. Score between 16-19:  high risk of alcohol related problems. Score 20 or above:  warrants further diagnostic evaluation for alcohol dependence and treatment.   CLINICAL FACTORS:   Depression:   Insomnia Severe   Musculoskeletal: Strength & Muscle Tone: within normal limits Gait & Station: normal Patient leans: N/A  Psychiatric Specialty Exam: Physical Exam  Nursing note and vitals reviewed. Constitutional: He is oriented to person, place, and time. He appears well-developed and well-nourished.  HENT:  Head: Normocephalic and atraumatic.  Eyes: Conjunctivae and EOM are normal. Pupils are equal, round, and reactive to light.  Neck: Normal range of motion.  Neck supple.  Cardiovascular: Normal rate, regular rhythm and normal heart sounds.   Respiratory: Effort normal and breath sounds normal.  GI: Soft. Bowel sounds are normal.  Musculoskeletal: Normal range of motion.  Neurological: He is alert and oriented to person, place, and time.  Skin: Skin is warm and dry.    Review of Systems  Musculoskeletal: Positive for back pain.  Psychiatric/Behavioral: Positive for depression and suicidal ideas.  All other systems reviewed and are negative.   Blood pressure 125/82, pulse 90, temperature 97.5 F (36.4 C), resp. rate 18, height 5\' 8"  (1.727 m), weight 112.492 kg (248 lb), SpO2 100 %.Body mass index is 37.72 kg/(m^2).  General Appearance: Casual  Eye Contact::  Good  Speech:  Slow  Volume:  Decreased  Mood:  Depressed  Affect:  Flat  Thought Process:  Goal Directed  Orientation:  Full (Time, Place, and Person)  Thought Content:  Hallucinations: Auditory  Suicidal Thoughts:  Yes.  with intent/plan  Homicidal Thoughts:  No  Memory:  Immediate;   Fair Recent;   Fair Remote;   Fair  Judgement:  Fair  Insight:  Fair  Psychomotor Activity:  Decreased  Concentration:  Fair  Recall:  [employer] of Knowledge:Fair  Language: Fair  Akathisia:  No  Handed:  Right  AIMS (if indicated):     Assets:  Communication Skills Desire for Improvement Financial Resources/Insurance Housing Resilience Social Support  Sleep:  Number of Hours: 4.75  Cognition: WNL  ADL's:  Intact     COGNITIVE FEATURES THAT CONTRIBUTE TO RISK:  None    SUICIDE RISK:   Moderate:  Frequent suicidal ideation with limited intensity, and duration, some specificity in terms of plans, no associated intent, good self-control, limited dysphoria/symptomatology, some risk factors  present, and identifiable protective factors, including available and accessible social support.  PLAN OF CARE: Hospita; admission, medication menagement, discharge planning.  Medical Decision  Making:  New problem, with additional work up planned, Review of Psycho-Social Stressors (1), Review or order clinical lab tests (1), Review of Medication Regimen & Side Effects (2) and Review of New Medication or Change in Dosage (2)   [name] is a 54 year old male with a hiostory of depression admitted for suicidal ideation in the context of major loss.  1. Suicidal ideation. He is able to contract for safety in the hospital.  2. Mood. He was restarted on Wellbutrin for depression.  3. HTN. We continue Lisinopril.  4. Diabetes. We continue metformin,  insulin with ADA diet and SSI.  5. Disposition. He will be discharged to home in [place]. He will follow up with his local provider.   I certify that inpatient services furnished can reasonably be expected to improve the patient's condition.   Gilman Olazabal 01/05/2015, 1:03 PM

## 2015-01-05 NOTE — H&P (Addendum)
Psychiatric Admission Assessment Adult  Patient Identification: [name] MRN:  [id number] Date of Evaluation:  01/05/2015 Chief Complaint:  bipolar Principal Diagnosis: <principal problem not specified> Diagnosis:   Patient Active Problem List   Diagnosis Date Noted  . Cocaine abuse [F14.10] 01/04/2015  . Severe recurrent major depression with psychotic features ([place]) [F33.3] 01/04/2015  . Depression, major, recurrent, severe with psychosis ([place]) [F33.3] 08/29/2013  . Suicidal ideations [R45.851] 08/28/2013  . Chest pain [R07.9] 06/30/2013  . HTN (hypertension) [I10] 06/30/2013  . Uncontrolled diabetes mellitus ([place]) [E11.65] 06/30/2013  . Bipolar affect, depressed ([place]) [F31.30] 05/13/2011   History of Present Illness:  Identifying data. [name] is a 54 year old male with history of depression.  Chief complaint. "I lost my brother was my hero."  History of present illness. Information was obtained from the patient's chart. [name] has a long history of depression with multiple suicide attempts in the past. He has not been taking any antidepressants lately. He attended the funeral of his brother in [place] Last weekend. She was driving back home to [place] he became increasingly emotional thinking that he should be that that one of his brother. He started thinking about driving his car off the road and came to the hospital asking for help. Says his brother death the patient has been increasingly depressed with poor sleep  of only 2 hours a night, decreased appetite, anhedonia, feeling of guilt and hopelessness worthlessness, poor energy and concentration, social isolation, crying spells, auditory command hallucinations telling him to kill himself. There are no complaints of heightened anxiety. He denies symptoms suggestive of bipolar mania. He denies any recent alcohol or cocaine use but is positive for cocaine.  Past psychiatric history. Long history of depression with  multiple psychiatric hospitalizations. The patient is unable to tell me how many. He only remembers taking Zoloft which made him sick. There is information about him taking Wellbutrin and Cymbalta in the chart but the patient does not remember it. He had several suicide attempts using a gun that misfired and jumping off the bridge. There is a history of cocaine and alcohol use but the patient denies any current problems.   Family psychiatric history. Nonreported.  Social history. He is disabled from industrial accident. He is still married by separated from his wife. He lives alone in [place].   Total Time spent with patient: 1 hour  Past Psychiatric HistoryDepression.   Risk to Self: Is patient at risk for suicide?: No Risk to Others:   Prior Inpatient Therapy:   Prior Outpatient Therapy:    Alcohol Screening: 1. How often do you have a drink containing alcohol?: Never 9. Have you or someone else been injured as a result of your drinking?: No 10. Has a relative or friend or a doctor or another health worker been concerned about your drinking or suggested you cut down?: No Alcohol Use Disorder Identification Test Final Score (AUDIT): 0 Brief Intervention: AUDIT score less than 7 or less-screening does not suggest unhealthy drinking-brief intervention not indicated Substance Abuse History in the last 12 months:  Yes.   Consequences of Substance Abuse: Negative Previous Psychotropic Medications: Yes  Psychological Evaluations: No  Past Medical History:  Past Medical History  Diagnosis Date  . Diabetes mellitus     uncontrolled   . Hypertension   . Bipolar affective ([place])   . Depression     Past Surgical History  Procedure Laterality Date  . Knee surgery      b/l knees;  pending total knee    Family History:  Family History  Problem Relation Age of Onset  . Diabetes    . Cancer Mother     uterus; deceased   . Hypertension    . Heart attack Father     alive   . Stroke  Sister     12/2012 age 67 y.o   [place] reported. Social History:  History  Alcohol Use No     History  Drug Use  . Yes  . Special: Cocaine    Social History   Social History  . Marital Status: Legally Separated    Spouse Name: N/A  . Number of Children: N/A  . Years of Education: N/A   Social History Main Topics  . Smoking status: Former Research scientist (life sciences)  . Smokeless tobacco: None  . Alcohol Use: No  . Drug Use: Yes    Special: Cocaine  . Sexual Activity: Yes    Birth Control/ Protection: Condom   Other Topics Concern  . 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 Other Anaphylaxis    Allergy to GRITS , unknown exactly the specific ingredient he is allergic too  . Corn Oil Swelling   Lab Results:  Results for orders placed or performed during the hospital encounter of 01/04/15 (from the past 48 hour(s))  Glucose, capillary     Status: Abnormal   Collection Time: 01/05/15  6:48 AM  Result Value Ref Range   Glucose-Capillary 256 (H) 65 - 99 mg/dL  Glucose, capillary     Status: Abnormal   Collection Time: 01/05/15 12:17 PM  Result Value Ref Range   Glucose-Capillary 196 (H) 65 - 99 mg/dL   Comment 1 Notify RN     Metabolic Disorder Labs:  Lab Results  Component Value Date   HGBA1C 14.0* 12/31/2013   MPG 295* 05/31/2013   MPG 341* 05/15/2011   No results found for: PROLACTIN Lab Results  Component Value Date   CHOL  12/24/2009    167        ATP III CLASSIFICATION:  <200     mg/dL   Desirable  200-239  mg/dL   Borderline High  >=240    mg/dL   High          TRIG 49 12/24/2009   HDL 46  12/24/2009   CHOLHDL 3.6 12/24/2009   VLDL 10 12/24/2009   LDLCALC * 12/24/2009    111        Total Cholesterol/HDL:CHD Risk Coronary Heart Disease Risk Table                     Men   Women  1/2 Average Risk   3.4   3.3  Average Risk       5.0   4.4  2 X Average Risk   9.6   7.1  3 X Average Risk  23.4  11.0        Use the calculated Patient Ratio above and the CHD Risk Table to determine the patient's CHD Risk.        ATP III CLASSIFICATION (LDL):  <100     mg/dL   Optimal  100-129  mg/dL   Near or Above                    Optimal  130-159  mg/dL   Borderline  160-189  mg/dL   High  >190     mg/dL   Very High    Current Medications: Current Facility-Administered Medications  Medication Dose Route Frequency Provider Last Rate Last Dose  . acetaminophen (TYLENOL) tablet 650 mg  650 mg Oral Q6H PRN [name], MD      . alum & mag hydroxide-simeth (MAALOX/MYLANTA) 200-200-20 MG/5ML suspension 30 mL  30 mL Oral Q4H PRN [name], MD      . buPROPion (WELLBUTRIN XL) 24 hr tablet 150 mg  150 mg Oral Q breakfast Kalev Temme B Avarae Zwart, MD   150 mg at 01/05/15 0835  . insulin aspart (novoLOG) injection 0-9 Units  0-9 Units Subcutaneous TID WC [name], MD   2 Units at 01/05/15 1248  . insulin aspart protamine- aspart (NOVOLOG MIX 70/30) injection 35 Units  35 Units Subcutaneous Q breakfast [name], MD   35 Units at 01/05/15 [phone]  . insulin aspart protamine- aspart (NOVOLOG MIX 70/30) injection 40 Units  40 Units Subcutaneous Q supper [name], MD      . lisinopril (PRINIVIL,ZESTRIL) tablet 20 mg  20 mg Oral Q breakfast Rebecah Dangerfield B Glyndon Tursi, MD   20 mg at 01/05/15 0836  . magnesium hydroxide (MILK OF MAGNESIA) suspension 30 mL  30 mL Oral Daily PRN [name], MD      . metFORMIN (GLUCOPHAGE) tablet 1,000 mg  1,000 mg Oral BID WC [name], MD   1,000 mg at 01/05/15 [id number]   PTA Medications: Prescriptions prior to admission  Medication Sig  Dispense Refill Last Dose  . buPROPion (WELLBUTRIN) 75 MG tablet Take 3 tablets (225 mg total) by mouth daily.     . DULoxetine (CYMBALTA) 60 MG capsule Take 1 capsule (60 mg total) by mouth daily.     Marland Kitchen gabapentin (NEURONTIN) 400 MG capsule Take 1 capsule (400 mg total) by mouth 3 (three) times daily.     Marland Kitchen LEVEMIR FLEXTOUCH 100 UNIT/ML Pen Inject 20 Units into the skin at bedtime. TO BE MANAGED BY HOSPITAL WITH DOSING 15 mL    . lisinopril (PRINIVIL,ZESTRIL) 20 MG tablet Take 1 tablet (20 mg total) by mouth daily. For hypertension 30 tablet 0 12/01/2014 at Unknown time  . metFORMIN (GLUCOPHAGE) 1000 MG tablet Take 1 tablet (1,000 mg total) by mouth 2 (two) times daily with a meal. For diabetes control   12/01/2014 at Unknown time  . nicotine (NICODERM CQ - DOSED IN MG/24 HOURS) 21 mg/24hr patch Place 1 patch (21 mg total) onto the skin daily. 28 patch 0     Musculoskeletal: Strength & Muscle Tone: within normal limits Gait & Station: normal Patient leans: N/A  Psychiatric Specialty Exam: Physical Exam  Nursing note and vitals reviewed.   Review of Systems  Musculoskeletal: Positive for back pain.  Psychiatric/Behavioral: Positive for depression and suicidal ideas.  All other systems reviewed and are negative.   Blood pressure 125/82, pulse 90, temperature 97.5 F (36.4 C), resp. rate  18, height 5\' 8"  (1.727 m), weight 112.492 kg (248 lb), SpO2 100 %.Body mass index is 37.72 kg/(m^2).  See SRA.                                                  Sleep:  Number of Hours: 4.75     Treatment Plan Summary: Daily contact with patient to assess and evaluate symptoms and progress in treatment and Medication management    Mr. Navratil is a 54 year old male with a hiostory of depression admitted for suicidal ideation in the context of major loss.  1. Suicidal ideation. He is able to contract for safety in the hospital.  2. Mood. He was restarted on Wellbutrin for  depression. I will add low-dose Risperdal for hallucinations.  3. HTN. We continue Lisinopril.  4. Diabetes. We continue metformin,  insulin with ADA diet and SSI.  5. Insomnia. We'll start Ambien.   6. Back pain. We'll offer ibuprofen.   7. Substance abuse. The patient is positive for cocaine on admission. There is a history of substance use the patient denies any current use. He is not interested in substance abuse treatment.  8. Disposition. He will be discharged to home in [place]. He will follow up with his local provider.    Observation Level/Precautions:  15 minute checks  Laboratory:  CBC Chemistry Profile HbAIC UDS UA  Psychotherapy:    Medications:    Consultations:    Discharge Concerns:    Estimated LOS:  Other:     I certify that inpatient services furnished can reasonably be expected to improve the patient's condition.   Brian Zeitlin 11/10/20161:20 PM

## 2015-01-06 LAB — GLUCOSE, CAPILLARY
Glucose-Capillary: 134 mg/dL — ABNORMAL HIGH (ref 65–99)
Glucose-Capillary: 96 mg/dL (ref 65–99)
Glucose-Capillary: 134 mg/dL — ABNORMAL HIGH (ref 65–99)
Glucose-Capillary: 105 mg/dL — ABNORMAL HIGH (ref 65–99)

## 2015-01-06 MED ORDER — RISPERIDONE 3 MG PO TABS
3.0000 mg | ORAL_TABLET | Freq: Every day | ORAL | Status: DC
  Administered 2015-01-07 – 2015-01-08 (×2): 3 mg via ORAL
  Filled 2015-01-06 (×2): qty 1

## 2015-01-06 MED ORDER — INSULIN DETEMIR 100 UNIT/ML ~~LOC~~ SOLN
20.0000 [IU] | Freq: Every day | SUBCUTANEOUS | Status: DC
  Administered 2015-01-06 – 2015-01-08 (×3): 20 [IU] via SUBCUTANEOUS
  Filled 2015-01-06 (×4): qty 0.2

## 2015-01-06 NOTE — Progress Notes (Signed)
Recreation Therapy Notes  Date: 11.11.16 Time: 3:00 pm Location: Craft Room  Group Topic: Communication, Problem Solving, Teamwork  Goal Area(s) Addresses:  Patient will effectively work with peer towards shared goal. Patient will identify skills used to make activity successful. Patient will identify benefit of using group skills effectively post d/c.  Behavioral Response: Did not attend  Intervention: [employer]  Activity: Patients were split into 2 groups and instructed to build the tallest free standing tower out of 15 pipe cleaners. Patients were given 2 minutes to determine a strategy. After patient's had been building for approximately 5 minutes, patients were instructed to put their dominant hand behind their back. After approximately 3 minutes of building, patients were instructed to stop talking to their teammates.    Education: LRT educated patients on how communication, problem solving, and teamwork relate to building their healthy support systems.  Education Outcome: Patient did not attend group.  Clinical Observations/Feedback: Patient did not attend group.  Leonette Monarch, LRT/CTRS 01/06/2015 4:40 PM

## 2015-01-06 NOTE — Progress Notes (Signed)
D: Pt denies SI/HI/AVH. Pt is irritable, and upset that he is not getting Lantus insulin tonight, explained to patient that he isn't scheduled for one, secondly his blood glucose was WNL, Patient appears upset and angry and he refused his sleeping medication. Patient is interacting with peers appropriately, but upset at staff.  A: Pt was offered support and encouragement. Pt was encouraged to attend groups. Q 15 minute checks were done for safety.  R:Pt attends groups and interacts well with peers. Pt is not compliant with some medication. Pt has complaints about his medication regimen.Pt is not receptive to treatment. 15 minutes checks maintained on unit.

## 2015-01-06 NOTE — Plan of Care (Signed)
Problem: [facility] Participation in Recreation Therapeutic Interventions Goal: STG-Patient will identify at least five coping skills for ** STG: Coping Skills - Within 4 treatment sessions, patient will verbalize at least 5 coping skills for substance abuse in each of 2 treatment sessions to decrease substance abuse post d/c.  Outcome: Progressing Treatment Session 1; Completed 1 out of 2: At approximately 2:00 pm, LRT met with patient in patient room. Patient verbalized 5 coping skills for substance abuse. LRT educated patient on leisure and why it is important to implement into his schedule. LRT educated and provided patient with blank schedules to help him plan his day and try to avoid using substances. LRT educated patient on healthy support systems. Intervention Used: Coping Skills worksheet  Leonette Monarch, LRT/CTRS 11.11.16 2:23 pm Goal: STG-Other Recreation Therapy Goal (Specify) STG: Stress Management - Within 4 treatment sessions, patient will verbalize understanding of the stress management techniques in each of 2 treatment sessions to increase stress management post d/c.  Outcome: Progressing Treatment Session 1; Completed 1 out of 2: At approximately 2:00 pm, LRT met with patient in patient room. LRT educated and provided patient with handouts on stress management techniques. Patient verbalized understanding. LRT encouraged patient to read over and practice the stress management techniques. Intervention Used: Mindfulness and Progressive Muscle Relaxation handouts  Leonette Monarch, LRT/CTRS 11.11.16 2:25 pm

## 2015-01-06 NOTE — BHH Group Notes (Signed)
[facility] LCSW Aftercare Discharge Planning Group Note   01/06/2015 12:09 PM  Participation Quality: Active  Mood/Affect:  Flat  Thoughts of Suicide:  NA Will you contract for safety?   NA  Current AVH:  NA  Plan for Discharge/Comments: Patient attended and participated in group discussion appropriately. Patient introduced himself and identified his goal is to speak with MD to discuss medication. Patient reported that he needs more time to identify a discharge plan.  Transportation Means: Patient was unable to identify transportation means. CSW Intern informed patient about appropriate resources.  Supports: Patient was unable to identify sources of support. Patient was encouraged to follow up with an outpatient mental health provider upon discharge.  Christa See, Clinical Social Work Intern 01/06/15  Carmell Austria, MSW, Latanya Presser

## 2015-01-06 NOTE — Plan of Care (Signed)
Problem: Ineffective individual coping Goal: LTG: Patient will report a decrease in negative feelings Outcome: Progressing Patient denies SI/HI.

## 2015-01-06 NOTE — Progress Notes (Signed)
Denies SI, depression or AVH.  Irritable first am but as day progressed became more pleasant and affect brightened.   Visible in milieu. Interacting appropriately.  Safety maintained.

## 2015-01-06 NOTE — BHH Group Notes (Signed)
[place] LCSW Group Therapy  01/06/2015 4:51 PM  Type of Therapy:  Group Therapy  Participation Level:  Active  Participation Quality:  Appropriate  Affect:  Appropriate  Cognitive:  Alert  Insight:  Improving  Engagement in Therapy:  Improving  Modes of Intervention:  Discussion, Education, Socialization and Support  Summary of Progress/Problems:Feelings around Relapse. Group members discussed the meaning of relapse and shared personal stories of relapse, how it affected them and others, and how they perceived themselves during this time. Group members were encouraged to identify triggers, warning signs and coping skills used when facing the possibility of relapse. Social supports were discussed and explored in detail. Callen attended group late. He discussed having difficulties thinking of his healthy coping skills when he is having a mental health crisis. He believes using them routinely would help him remember.   [place] MSW, [place]  01/06/2015, 4:51 PM

## 2015-01-06 NOTE — BHH Group Notes (Signed)
[facility] Group Notes:  (Nursing/MHT/Case Management/Adjunct)  Date:  01/06/2015  Time:  1:18 PM  Type of Therapy:  Group Therapy  Participation Level:  None  Participation Quality:  Supportive  Affect:  Flat  Cognitive:  Appropriate  Insight:  Lacking  Engagement in Group:  None  Modes of Intervention:  Activity  Summary of Progress/Problems:  [name] [name] 01/06/2015, 1:18 PM

## 2015-01-06 NOTE — Progress Notes (Signed)
[facility] MD Progress Note  01/06/2015 10:12 PM NIKAN RATHGEB  MRN:  [id number]  Subjective:  Mr. Lollie has a history of depression and substance abuse admitted for suicidal ideation and auditory hallucinations in the context of major loss and relapse on cocaine.  Mr. Abraha is still depressed, suicidal and hallucinating. He accepts medications and tolerates them well. There are no somatic complaints. Sleep and appetite are fair.  Principal Problem: Severe recurrent major depression with psychotic features [facility]) Diagnosis:   Patient Active Problem List   Diagnosis Date Noted  . Cocaine use disorder, moderate, dependence ([place]) [F14.20] 01/04/2015  . Severe recurrent major depression with psychotic features ([place]) [F33.3] 01/04/2015  . Depression, major, recurrent, severe with psychosis ([place]) [F33.3] 08/29/2013  . Suicidal ideations [R45.851] 08/28/2013  . Chest pain [R07.9] 06/30/2013  . HTN (hypertension) [I10] 06/30/2013  . Uncontrolled diabetes mellitus ([place]) [E11.65] 06/30/2013  . Bipolar affect, depressed ([place]) [F31.30] 05/13/2011   Total Time spent with patient: 20 minutes  Past Psychiatric History: depression and cocaine dependence.  Past Medical History:  Past Medical History  Diagnosis Date  . Diabetes mellitus     uncontrolled   . Hypertension   . Bipolar affective ([place])   . Depression     Past Surgical History  Procedure Laterality Date  . Knee surgery      b/l knees; pending total knee    Family History:  Family History  Problem Relation Age of Onset  . Diabetes    . Cancer Mother     uterus; deceased   . Hypertension    . Heart attack Father     alive   . Stroke Sister     12/2012 age 29 y.o   Family Psychiatric  History: none reported. Social History:  History  Alcohol Use No     History  Drug Use  . Yes  . Special: Cocaine    Social History   Social History  . Marital Status: Legally Separated    Spouse Name: N/A  . Number of Children: N/A   . Years of Education: N/A   Social History Main Topics  . Smoking status: Former Research scientist (life sciences)  . Smokeless tobacco: None  . Alcohol Use: No  . Drug Use: Yes    Special: Cocaine  . Sexual Activity: Yes    Birth Control/ Protection: Condom   Other Topics Concern  . 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 acetaminophen (TYLENOL) tablet 650 mg  650 mg Oral Q6H PRN [name], MD      . alum & mag hydroxide-simeth (MAALOX/MYLANTA) 200-200-20 MG/5ML suspension 30 mL  30 mL Oral Q4H PRN [name], MD      . buPROPion (WELLBUTRIN XL) 24 hr tablet 150 mg  150 mg Oral Q breakfast [name], MD  150 mg at 01/06/15 0754  . ibuprofen (ADVIL,MOTRIN) tablet 600 mg  600 mg Oral Q6H PRN Ximena Todaro B Avian Greenawalt, MD      . insulin aspart (novoLOG) injection 0-9 Units  0-9 Units Subcutaneous TID WC [name], MD   1 Units at 01/06/15 1755  . insulin aspart protamine- aspart (NOVOLOG MIX 70/30) injection 35 Units  35 Units Subcutaneous Q breakfast [name], MD   35 Units at 01/06/15 0754  . insulin aspart protamine- aspart (NOVOLOG MIX 70/30) injection 40 Units  40 Units Subcutaneous Q supper [name], MD   40 Units at 01/06/15 1757  . insulin detemir (LEVEMIR) injection 20 Units  20 Units Subcutaneous QHS Nashaun Hillmer B Leitha Hyppolite, MD      . lisinopril (PRINIVIL,ZESTRIL) tablet 20 mg  20 mg Oral Q breakfast Jahna Liebert B Lakyia Behe, MD   20 mg at 01/06/15 0754  . magnesium hydroxide (MILK OF MAGNESIA) suspension 30 mL   30 mL Oral Daily PRN [name], MD      . metFORMIN (GLUCOPHAGE) tablet 1,000 mg  1,000 mg Oral BID WC [name], MD   1,000 mg at 01/06/15 1755  . risperiDONE (RISPERDAL) tablet 1 mg  1 mg Oral QHS [name], MD   1 mg at 01/05/15 2203  . zolpidem (AMBIEN) tablet 10 mg  10 mg Oral QHS [name], MD   10 mg at 01/05/15 2203    Lab Results:  Results for orders placed or performed during the hospital encounter of 01/04/15 (from the past 48 hour(s))  Glucose, capillary     Status: Abnormal   Collection Time: 01/05/15  6:48 AM  Result Value Ref Range   Glucose-Capillary 256 (H) 65 - 99 mg/dL  Glucose, capillary     Status: Abnormal   Collection Time: 01/05/15 12:17 PM  Result Value Ref Range   Glucose-Capillary 196 (H) 65 - 99 mg/dL   Comment 1 Notify RN   Glucose, capillary     Status: Abnormal   Collection Time: 01/05/15  4:11 PM  Result Value Ref Range   Glucose-Capillary 148 (H) 65 - 99 mg/dL  Glucose, capillary     Status: Abnormal   Collection Time: 01/05/15  8:59 PM  Result Value Ref Range   Glucose-Capillary 117 (H) 65 - 99 mg/dL  Glucose, capillary     Status: Abnormal   Collection Time: 01/06/15  7:03 AM  Result Value Ref Range   Glucose-Capillary 134 (H) 65 - 99 mg/dL  Glucose, capillary     Status: None   Collection Time: 01/06/15 11:50 AM  Result Value Ref Range   Glucose-Capillary 96 65 - 99 mg/dL  Glucose, capillary     Status: Abnormal   Collection Time: 01/06/15  4:49 PM  Result Value Ref Range   Glucose-Capillary 134 (H) 65 - 99 mg/dL   Comment 1 Notify RN   Glucose, capillary     Status: Abnormal   Collection Time: 01/06/15  9:09 PM  Result Value Ref Range   Glucose-Capillary 105 (H) 65 - 99 mg/dL    Physical Findings: AIMS: Facial and Oral Movements Muscles of Facial Expression: None, normal Lips and Perioral Area: None, normal Jaw: None, normal Tongue: None, normal,Extremity Movements Upper (arms, wrists, hands,  fingers): None, normal Lower (legs, knees, ankles, toes): None, normal, Trunk Movements Neck, shoulders, hips: None, normal, Overall Severity Severity of abnormal movements (highest score from questions above): None, normal Incapacitation due to abnormal movements:  None, normal Patient's awareness of abnormal movements (rate only patient's report): No Awareness, Dental Status Current problems with teeth and/or dentures?: No Does patient usually wear dentures?: No  CIWA:    COWS:     Musculoskeletal: Strength & Muscle Tone: within normal limits Gait & Station: normal Patient leans: N/A  Psychiatric Specialty Exam: Review of Systems  All other systems reviewed and are negative.   Blood pressure 137/90, pulse 78, temperature 97.6 F (36.4 C), temperature source Oral, resp. rate 20, height 5\' 8"  (1.727 m), weight 112.492 kg (248 lb), SpO2 100 %.Body mass index is 37.72 kg/(m^2).  General Appearance: Casual  Eye Contact::  Fair  Speech:  Clear and Coherent  Volume:  Normal  Mood:  Depressed  Affect:  Blunt  Thought Process:  Goal Directed  Orientation:  Full (Time, Place, and Person)  Thought Content:  Hallucinations: Auditory  Suicidal Thoughts:  Yes.  with intent/plan  Homicidal Thoughts:  No  Memory:  Immediate;   Fair Recent;   Fair Remote;   Fair  Judgement:  Poor  Insight:  Lacking  Psychomotor Activity:  Normal  Concentration:  Fair  Recall:  [employer] of Knowledge:Fair  Language: Fair  Akathisia:  No  Handed:  Right  AIMS (if indicated):     Assets:  Communication Skills Desire for Improvement Financial Resources/Insurance Housing Resilience Social Support  ADL's:  Intact  Cognition: WNL  Sleep:  Number of Hours: 7.15   Treatment Plan Summary: Daily contact with patient to assess and evaluate symptoms and progress in treatment and Medication management   Mr. Zawada is a 54 year old male with a hiostory of depression admitted for suicidal ideation in the  context of major loss.  1. Suicidal ideation. He is able to contract for safety in the hospital.  2. Mood. He was restarted on Wellbutrin for depression. We increase Risperdal to 3 mg for hallucinations.  3. HTN. We continue Lisinopril.  4. Diabetes. We continue metformin, insulin with ADA diet and SSI. In addition to 70/30 Novolog, he should be getting 20 units of Levemir nightly.  5. Insomnia. We started Ambien.   6. Back pain. We offered ibuprofen.   7. Substance abuse. The patient is positive for cocaine on admission. There is a history of substance use the patient denies any current use. He is not interested in substance abuse treatment.  8. Disposition. He will be discharged to home in [place]. He will follow up with his local provider.   Tobiah Celestine 01/06/2015, 10:12 PM

## 2015-01-06 NOTE — Plan of Care (Signed)
Problem: Alteration in mood Goal: LTG-Patient reports reduction in suicidal thoughts (Patient reports reduction in suicidal thoughts and is able to verbalize a safety plan for whenever patient is feeling suicidal)  Outcome: Progressing Denies SI     
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
## 2015-01-06 NOTE — Tx Team (Signed)
Interdisciplinary Treatment Plan Update (Adult)  Date:  01/06/2015 Time Reviewed:  2:52 PM  Progress in Treatment: Attending groups: Yes. Participating in groups:  Yes. Taking medication as prescribed:  Yes. Tolerating medication:  Yes. Family/Significant othe contact made:  No, will contact:  if patient provides consent Patient understands diagnosis:  Yes. Discussing patient identified problems/goals with staff:  Yes. Medical problems stabilized or resolved:  Yes. Denies suicidal/homicidal ideation: No. Issues/concerns per patient self-inventory:  No. Other:  New problem(s) identified: No, Describe:  none reported  Discharge Plan or Barriers:Patient has his truck in parking lot and will discharge home to [place] once stabilized on medications. Patient can return home with his wife but would like to make other arrangements. Patient will need mental health followup in [facility].  Reason for Continuation of Hospitalization: Depression Hallucinations Suicidal ideation  Comments:  Estimated length of stay: up to 4 days expected discharge Monday 01/09/15  New goal(s):  Review of initial/current patient goals per problem list:   1.  Goal(s):eliminate SI  Met:  No  Target date:by discharge  2.  Goal (s):decrease depression  Met:  No  Target date:by discharge  3.  Goal(s):eliminate AH  Met:  No  Target date:by discharge  Attendees: Physician:  Orson Slick, MD 11/11/20162:52 PM  Nursing:   Mordecai Rasmussen, RN 11/11/20162:52 PM  Other:  Carmell Austria, LCSWA 11/11/20162:52 PM  Other:   11/11/20162:52 PM  Other:   11/11/20162:52 PM  Other:  11/11/20162:52 PM  Other:  11/11/20162:52 PM  Other:  11/11/20162:52 PM  Other:  11/11/20162:52 PM  Other:  11/11/20162:52 PM  Other:  11/11/20162:52 PM  Other:   11/11/20162:52 PM   Scribe for Treatment Team:   Keene Breath, MSW, LCSWA  01/06/2015, 2:52 PM

## 2015-01-07 LAB — GLUCOSE, CAPILLARY
Glucose-Capillary: 122 mg/dL — ABNORMAL HIGH (ref 65–99)
Glucose-Capillary: 126 mg/dL — ABNORMAL HIGH (ref 65–99)
Glucose-Capillary: 128 mg/dL — ABNORMAL HIGH (ref 65–99)
Glucose-Capillary: 64 mg/dL — ABNORMAL LOW (ref 65–99)
Glucose-Capillary: 88 mg/dL (ref 65–99)

## 2015-01-07 NOTE — Progress Notes (Signed)
Hypoglycemic Event  CBG: 64 Treatment: 15 GM carbohydrate snack  Symptoms: Nervous/irritable  Follow-up CBG: Time: 2115 88  Possible Reasons for Event: Unknown  Comments/MD notified:No  Adult (Non-Pregnant) Hypoglycemia Protocol Treatment Guidelines  1.  RN shall initiate Hypoglycemia Protocol emergency measures immediately when:            w Routine or STAT CBG and/or a lab glucose indicates hypoglycemia (CBG < 70 mg/dl)  2.  Treat the patient according to ability to take PO's and severity of hypoglycemia.   3.  If patient is on GlucoStabilizer, follow directions provided by the [facility] for hypoglycemic events.  4.  If patient on insulin pump, follow Hypoglycemia Protocol.  If patient requires more than one treatment have patient place pump in SUSPEND and notify MD.  DO NOT leave pump in SUSPEND for greater than 30 minutes unless ordered by MD.  A.  Treatment for Mild or Moderate-Patient cooperative and able to swallow    1.  Patient taking PO's and can cooperate   a.  Give one of the following 15 gram CHO options:                           w 1 tube oral dextrose gel                           w 3-4 Glucose tablets                           w 4 oz. Juice                           w 4 oz. regular soda                                    ESRD patients:  clear, regular soda                           w 8 oz. skim milk    b.  Recheck CBG in 15 minutes after treatment                            w If CBG < 70 mg/dl, repeat treatment and recheck until hypoglycemia is resolved                            w If CBG > 70 mg/dl and next meal is more than 1 hour away, give additional 15 grams CHO   2.  Patient NPO-Patient cooperative and no altered mental status    a.  Give 25 ml of D50 IV.   b.  Recheck CBG in 15 minutes after treatment.                             w If CBG is less than 70 mg/dl, repeat treatment and recheck until hypoglycemia is resolved.   c.  Notify MD for  further orders.             SPECIAL CONSIDERATIONS:    a.  If no IV access,  w Start IV of D5W at [facility]                             w Give 25 ml of D50 IV.    b.  If unable to gain IV access                             w Give Glucagon IM:    i.  1 mg if patient weighs more than 45.5 kg    ii.  0.5 mg if patient weighs less than 45.5 kg   c.  Notify MD for further orders  B.  Treatment for Severe-- Patient unconscious or unable to take PO's safely    1.  Position patient on side   2.  Give 50 ml D50 IV   3.  Recheck CBG in 15 minutes.                    w If CBG is less than 70 mg/dl, repeat treatment and recheck until hypoglycemia is resolved.   4.  Notify MD for further orders.    SPECIAL CONSIDERATIONS:    a.  If no IV access                              w Give Glucagon IM                                        i.  1 mg if patient weighs more than 45.5 kg                                       ii.  0.5 mg if patient weighs less than 45.5 kg                              w Start IV of D5W at 50 ml/hr and give 50 ml D50 IV   b.  If no IV access and active seizure                               w Call Rapid Response   c.  If unable to gain IV access, give Glucagon IM:                              w 1 mg if patient weighs more than 45.5 kg                              w 0.5 mg if patient weighs less than 45.5 kg   d.  Notify MD for further orders.  C.  Complete smart text progress note to document intervention and follow-up CBG   1.  In [facility] patient chart, click on Notes (left side of screen)   2.  Create Progress Note   3.  Click on [employer].  In the Match box type "hypo" and enter    4.  Double click on [facility] IP HYPOGLYCEMIC EVENT and enter data   5.  MD must be notified if patient is NPO or experienced severe hypoglycemia    [place]

## 2015-01-07 NOTE — Progress Notes (Signed)
[facility] MD Progress Note  01/07/2015 2:38 PM [name]  MRN:  [id number]  Subjective:  [name] has a history of depression and substance abuse admitted for suicidal ideation and auditory hallucinations in the context of major loss and relapse on cocaine.  Patient interviewed. Chart reviewed. Labs reviewed. Patient continues to report that his mood is down and depressed. Still feels sad. Not having acute suicidal planning but has some hopelessness. He is also complaining about his diet. Strangely, he complained to me that the inadequate diet was causing him to have blood sugars were running too high but then he complained to the nurses that he did not want to take the diabetic diet at all but wanted to have a regular diet. He has not shown any new behavior problems. Principal Problem: Severe recurrent major depression with psychotic features [facility]) Diagnosis:   Patient Active Problem List   Diagnosis Date Noted  . Cocaine use disorder, moderate, dependence ([place]) [F14.20] 01/04/2015  . Severe recurrent major depression with psychotic features ([place]) [F33.3] 01/04/2015  . Depression, major, recurrent, severe with psychosis ([place]) [F33.3] 08/29/2013  . Suicidal ideations [R45.851] 08/28/2013  . Chest pain [R07.9] 06/30/2013  . HTN (hypertension) [I10] 06/30/2013  . Uncontrolled diabetes mellitus ([place]) [E11.65] 06/30/2013  . Bipolar affect, depressed ([place]) [F31.30] 05/13/2011   Total Time spent with patient: 20 minutes  Past Psychiatric History: depression and cocaine dependence.  Past Medical History:  Past Medical History  Diagnosis Date  . Diabetes mellitus     uncontrolled   . Hypertension   . Bipolar affective ([place])   . Depression     Past Surgical History  Procedure Laterality Date  . Knee surgery      b/l knees; pending total knee    Family History:  Family History  Problem Relation Age of Onset  . Diabetes    . Cancer Mother     uterus; deceased   . Hypertension    .  Heart attack Father     alive   . Stroke Sister     12/2012 age 54 y.o   Family Psychiatric  History: none reported. Social History:  History  Alcohol Use No     History  Drug Use  . Yes  . Special: Cocaine    Social History   Social History  . Marital Status: Legally Separated    Spouse Name: N/A  . Number of Children: N/A  . Years of Education: N/A   Social History Main Topics  . Smoking status: Former Research scientist (life sciences)  . Smokeless tobacco: None  . Alcohol Use: No  . Drug Use: Yes    Special: Cocaine  . Sexual Activity: Yes    Birth Control/ Protection: Condom   Other Topics Concern  . 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 acetaminophen (TYLENOL) tablet  650 mg  650 mg Oral Q6H PRN [name], MD      . alum & mag hydroxide-simeth (MAALOX/MYLANTA) 200-200-20 MG/5ML suspension 30 mL  30 mL Oral Q4H PRN [name], MD      . buPROPion (WELLBUTRIN XL) 24 hr tablet 150 mg  150 mg Oral Q breakfast Jolanta B Pucilowska, MD   150 mg at 01/07/15 0831  . ibuprofen (ADVIL,MOTRIN) tablet 600 mg  600 mg Oral Q6H PRN Jolanta B Pucilowska, MD      . insulin aspart (novoLOG) injection 0-9 Units  0-9 Units Subcutaneous TID WC [name], MD   1 Units at 01/07/15 0831  . insulin aspart protamine- aspart (NOVOLOG MIX 70/30) injection 35 Units  35 Units Subcutaneous Q breakfast [name], MD   35 Units at 01/07/15 [id number]  . insulin aspart protamine- aspart (NOVOLOG MIX 70/30) injection 40 Units  40 Units  Subcutaneous Q supper [name], MD   40 Units at 01/06/15 1757  . insulin detemir (LEVEMIR) injection 20 Units  20 Units Subcutaneous QHS [name], MD   20 Units at 01/06/15 2222  . lisinopril (PRINIVIL,ZESTRIL) tablet 20 mg  20 mg Oral Q breakfast Jolanta B Pucilowska, MD   20 mg at 01/07/15 0831  . magnesium hydroxide (MILK OF MAGNESIA) suspension 30 mL  30 mL Oral Daily PRN [name], MD      . metFORMIN (GLUCOPHAGE) tablet 1,000 mg  1,000 mg Oral BID WC [name], MD   1,000 mg at 01/07/15 0831  . risperiDONE (RISPERDAL) tablet 3 mg  3 mg Oral QHS Jolanta B Pucilowska, MD      . zolpidem (AMBIEN) tablet 10 mg  10 mg Oral QHS [name], MD   10 mg at 01/06/15 2222    Lab Results:  Results for orders placed or performed during the hospital encounter of 01/04/15 (from the past 48 hour(s))  Glucose, capillary     Status: Abnormal   Collection Time: 01/05/15  4:11 PM  Result Value Ref Range   Glucose-Capillary 148 (H) 65 - 99 mg/dL  Glucose, capillary     Status: Abnormal   Collection Time: 01/05/15  8:59 PM  Result Value Ref Range   Glucose-Capillary 117 (H) 65 - 99 mg/dL  Glucose, capillary     Status: Abnormal   Collection Time: 01/06/15  7:03 AM  Result Value Ref Range   Glucose-Capillary 134 (H) 65 - 99 mg/dL  Glucose, capillary     Status: None   Collection Time: 01/06/15 11:50 AM  Result Value Ref Range   Glucose-Capillary 96 65 - 99 mg/dL  Glucose, capillary     Status: Abnormal   Collection Time: 01/06/15  4:49 PM  Result Value Ref Range   Glucose-Capillary 134 (H) 65 - 99 mg/dL   Comment 1 Notify RN   Glucose, capillary     Status: Abnormal   Collection Time: 01/06/15  9:09 PM  Result Value Ref Range   Glucose-Capillary 105 (H) 65 - 99 mg/dL  Glucose, capillary     Status: Abnormal   Collection Time: 01/07/15  6:48 AM  Result Value Ref Range   Glucose-Capillary 122 (H) 65 - 99 mg/dL  Glucose, capillary     Status: Abnormal    Collection Time: 01/07/15 11:54 AM  Result Value Ref Range   Glucose-Capillary 126 (H) 65 - 99 mg/dL    Physical Findings: AIMS: Facial and Oral Movements Muscles  of Facial Expression: None, normal Lips and Perioral Area: None, normal Jaw: None, normal Tongue: None, normal,Extremity Movements Upper (arms, wrists, hands, fingers): None, normal Lower (legs, knees, ankles, toes): None, normal, Trunk Movements Neck, shoulders, hips: None, normal, Overall Severity Severity of abnormal movements (highest score from questions above): None, normal Incapacitation due to abnormal movements: None, normal Patient's awareness of abnormal movements (rate only patient's report): No Awareness, Dental Status Current problems with teeth and/or dentures?: No Does patient usually wear dentures?: No  CIWA:    COWS:     Musculoskeletal: Strength & Muscle Tone: within normal limits Gait & Station: normal Patient leans: N/A  Psychiatric Specialty Exam: Review of Systems  Constitutional: Positive for malaise/fatigue.  HENT: Negative.   Eyes: Negative.   Respiratory: Negative.   Cardiovascular: Negative.   Gastrointestinal: Negative.   Musculoskeletal: Negative.   Skin: Negative.   Neurological: Negative.   Psychiatric/Behavioral: Positive for depression. Negative for suicidal ideas, hallucinations, memory loss and substance abuse. The patient is nervous/anxious and has insomnia.   All other systems reviewed and are negative.   Blood pressure 141/81, pulse 95, temperature 97.7 F (36.5 C), temperature source Oral, resp. rate 20, height 5\' 8"  (1.727 m), weight 112.492 kg (248 lb), SpO2 100 %.Body mass index is 37.72 kg/(m^2).  General Appearance: Casual  Eye Contact::  Fair  Speech:  Clear and Coherent  Volume:  Normal  Mood:  Depressed  Affect:  Blunt  Thought Process:  Goal Directed  Orientation:  Full (Time, Place, and Person)  Thought Content:  Hallucinations: Auditory  Suicidal  Thoughts:  Yes.  with intent/plan  Homicidal Thoughts:  No  Memory:  Immediate;   Fair Recent;   Fair Remote;   Fair  Judgement:  Poor  Insight:  Lacking  Psychomotor Activity:  Normal  Concentration:  Fair  Recall:  [employer] of Knowledge:Fair  Language: Fair  Akathisia:  No  Handed:  Right  AIMS (if indicated):     Assets:  Communication Skills Desire for Improvement Financial Resources/Insurance Housing Resilience Social Support  ADL's:  Intact  Cognition: WNL  Sleep:  Number of Hours: 6.15   Treatment Plan Summary: Daily contact with patient to assess and evaluate symptoms and progress in treatment and Medication management   Mr. Vernick is a 54 year old male with a hiostory of depression admitted for suicidal ideation in the context of major loss.  1. Suicidal ideation. He is able to contract for safety in the hospital. Seems to be getting a little bit better. Not having acute suicidal threats at this point.  2. Mood. He was restarted on Wellbutrin for depression. We increase Risperdal to 3 mg for hallucinations. Mood seems improved with no clear thought disorder. 3. HTN. We continue Lisinopril.  4. Diabetes. We continue metformin, insulin with ADA diet and SSI. In addition to 70/30 Novolog, he should be getting 20 units of Levemir nightly. Patient will be switched back to his regular diet as his request  5. Insomnia. We started Ambien.   6. Back pain. We offered ibuprofen.   7. Substance abuse. The patient is positive for cocaine on admission. There is a history of substance use the patient denies any current use. He is not interested in substance abuse treatment.  8. Disposition. He will be discharged to home in [place]. He will follow up with his local provider. Possible discharge within the next couple days. Patient is hoping to get back down toward [place].  Lois Nikolas Casher 01/07/2015, 2:38 PM

## 2015-01-07 NOTE — [facility] Group Notes (Signed)
[facility] LCSW Group Therapy  01/07/2015 1:05 PM  Type of Therapy:  Group Therapy  Participation Level:  Active  Participation Quality:  Attentive  Affect:  Appropriate  Cognitive:  Alert  Insight:  Improving  Engagement in Therapy:  Improving  Modes of Intervention:  Discussion, Education, Socialization and Support  Summary of Progress/Problems: Pt will identify unhealthy thoughts and how they impact their emotions and behavior. Pt will be encouraged to discuss these thoughts, emotions and behaviors with the group. [name] attended group and stayed the entire time. He discussed difficulties accepting positive feedback from others. He reports feeling guilty due his past mistakes.   [place] MSW, [place]  01/07/2015, 1:05 PM

## 2015-01-07 NOTE — Progress Notes (Signed)
Patient was pleasant and cooperative on shift. He spent most of the evening in the dayroom with peers and staff. He denies suicidal and homicidal ideation. He is cooperative with treatment and denies suicidal thoughts. He appears to be resting in bed at this time.

## 2015-01-07 NOTE — Progress Notes (Signed)
Pleasant and cooperative.  Denies depression and SI. Medication and group compliant.  Visible in the milieu. Interacting appropriately.  Safety maintained.

## 2015-01-07 NOTE — Plan of Care (Signed)
Problem: Diagnosis: Increased Risk For Suicide Attempt Goal: STG-Patient Will Attend All Groups On The Unit Outcome: Progressing Denies SI and attending unit program.

## 2015-01-07 NOTE — Progress Notes (Signed)
D: Pt denies SI/HI/AVH. Pt is irritable at times but brightens up later, cooperative with care, he appears less anxious and he is interacting with peers and staff appropriately.  A: Pt was offered support and encouragement. Pt was given scheduled medications. Pt was encouraged to attend groups. Q 15 minute checks were done for safety.  R:Pt attends groups and interacts well with peers and staff. Pt is compliant with medication. Pt has no complaints.Pt receptive to treatment and safety maintained on unit.

## 2015-01-08 LAB — GLUCOSE, CAPILLARY
Glucose-Capillary: 154 mg/dL — ABNORMAL HIGH (ref 65–99)
Glucose-Capillary: 128 mg/dL — ABNORMAL HIGH (ref 65–99)
Glucose-Capillary: 175 mg/dL — ABNORMAL HIGH (ref 65–99)
Glucose-Capillary: 127 mg/dL — ABNORMAL HIGH (ref 65–99)

## 2015-01-08 MED ORDER — ACETAMINOPHEN 325 MG PO TABS: 650.0000 mg | ORAL_TABLET | Freq: Four times a day (QID) | ORAL | Status: DC | PRN

## 2015-01-08 MED ORDER — MAGNESIUM HYDROXIDE 400 MG/5ML PO SUSP: 30.0000 mL | Freq: Every day | ORAL | Status: DC | PRN

## 2015-01-08 MED ORDER — TRAZODONE HCL 100 MG PO TABS
100.0000 mg | ORAL_TABLET | Freq: Once | ORAL | Status: AC
  Administered 2015-01-08: 100 mg via ORAL
  Filled 2015-01-08: qty 1

## 2015-01-08 MED ORDER — RISPERIDONE 3 MG PO TABS: 3.0000 mg | ORAL_TABLET | Freq: Every day | ORAL | Status: DC

## 2015-01-08 MED ORDER — ZOLPIDEM TARTRATE 10 MG PO TABS: 10.0000 mg | ORAL_TABLET | Freq: Every day | ORAL | Status: DC

## 2015-01-08 MED ORDER — INSULIN ASPART PROT & ASPART (70-30 MIX) 100 UNIT/ML ~~LOC~~ SUSP: 40.0000 [IU] | Freq: Every day | SUBCUTANEOUS | Status: DC

## 2015-01-08 MED ORDER — ALUM & MAG HYDROXIDE-SIMETH 200-200-20 MG/5ML PO SUSP: 30.0000 mL | ORAL | Status: DC | PRN

## 2015-01-08 MED ORDER — INSULIN ASPART PROT & ASPART (70-30 MIX) 100 UNIT/ML ~~LOC~~ SUSP: 35.0000 [IU] | Freq: Every day | SUBCUTANEOUS | Status: DC

## 2015-01-08 MED ORDER — BUPROPION HCL ER (XL) 150 MG PO TB24: 150.0000 mg | ORAL_TABLET | Freq: Every day | ORAL | Status: DC

## 2015-01-08 NOTE — [facility] Group Notes (Signed)
[facility] LCSW Group Therapy  01/08/2015 9:29 AM  Type of Therapy:  Group Therapy  Participation Level:  Active  Participation Quality:  Attentive  Affect:  Appropriate  Cognitive:  Alert  Insight:  Improving  Engagement in Therapy:  Improving  Modes of Intervention:  Discussion, Education, Socialization and Support  Summary of Progress/Problems: Todays topic: Grudges  Patients will be encouraged to discuss their thoughts, feelings, and behaviors as to why one holds on to grudges and reasons why people have grudges. Patients will process the impact of grudges on their daily lives and identify thoughts and feelings related to holding grudges. Patients will identify feelings and thoughts related to what life would look like without grudges. [name] attended group and stayed the entire time. He discussed holding a grudge against his father and a friend.   [name] MSW, [place]  01/08/2015, 9:29 AM

## 2015-01-08 NOTE — Progress Notes (Signed)
D: patient presenting with irritability at times.  Patient compliant with medications and remains a finger stick for blood sugar.  Patient denies SI and HI.  Patient states that his depression is decreasing.  Patient seen in the milieu several times interacting appropriately with others.  Patient in no distress at this time.  A: support and encouragement provided.  Medications given as prescribed.  q 15 min checks done R: patient receptive of information given

## 2015-01-08 NOTE — Progress Notes (Signed)
[facility] MD Progress Note  01/08/2015 1:49 PM [name]  MRN:  [id number]  Subjective:  [name] has a history of depression and substance abuse admitted for suicidal ideation and auditory hallucinations in the context of major loss and relapse on cocaine.  Patient interviewed. Chart reviewed. Labs reviewed. Patient continues to report that his mood is down and depressed. Still feels sad. Not having acute suicidal planning but has some hopelessness. He is also complaining about his diet. Strangely, he complained to me that the inadequate diet was causing him to have blood sugars were running too high but then he complained to the nurses that he did not want to take the diabetic diet at all but wanted to have a regular diet. He has not shown any new behavior problems. Principal Problem: Severe recurrent major depression with psychotic features [name]) Diagnosis:   Patient Active Problem List   Diagnosis Date Noted  . Cocaine use disorder, moderate, dependence ([place]) [F14.20] 01/04/2015  . Severe recurrent major depression with psychotic features ([place]) [F33.3] 01/04/2015  . Depression, major, recurrent, severe with psychosis ([place]) [F33.3] 08/29/2013  . Suicidal ideations [R45.851] 08/28/2013  . Chest pain [R07.9] 06/30/2013  . HTN (hypertension) [I10] 06/30/2013  . Uncontrolled diabetes mellitus ([place]) [E11.65] 06/30/2013  . Bipolar affect, depressed ([place]) [F31.30] 05/13/2011   Total Time spent with patient: 20 minutes  Past Psychiatric History: depression and cocaine dependence.  Past Medical History:  Past Medical History  Diagnosis Date  . Diabetes mellitus     uncontrolled   . Hypertension   . Bipolar affective ([place])   . Depression     Past Surgical History  Procedure Laterality Date  . Knee surgery      b/l knees; pending total knee    Family History:  Family History  Problem Relation Age of Onset  . Diabetes    . Cancer Mother     uterus; deceased   . Hypertension    .  Heart attack Father     alive   . Stroke Sister     12/2012 age 33 y.o   Family Psychiatric  History: none reported. Social History:  History  Alcohol Use No     History  Drug Use  . Yes  . Special: Cocaine    Social History   Social History  . Marital Status: Legally Separated    Spouse Name: N/A  . Number of Children: N/A  . Years of Education: N/A   Social History Main Topics  . Smoking status: Former Research scientist (life sciences)  . Smokeless tobacco: None  . Alcohol Use: No  . Drug Use: Yes    Special: Cocaine  . Sexual Activity: Yes    Birth Control/ Protection: Condom   Other Topics Concern  . 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 acetaminophen (TYLENOL) tablet  650 mg  650 mg Oral Q6H PRN [name], MD      . alum & mag hydroxide-simeth (MAALOX/MYLANTA) 200-200-20 MG/5ML suspension 30 mL  30 mL Oral Q4H PRN [name], MD      . buPROPion (WELLBUTRIN XL) 24 hr tablet 150 mg  150 mg Oral Q breakfast Jolanta B Pucilowska, MD   150 mg at 01/08/15 0802  . ibuprofen (ADVIL,MOTRIN) tablet 600 mg  600 mg Oral Q6H PRN Jolanta B Pucilowska, MD   600 mg at 01/08/15 1146  . insulin aspart (novoLOG) injection 0-9 Units  0-9 Units Subcutaneous TID WC [name], MD   2 Units at 01/08/15 0801  . insulin aspart protamine- aspart (NOVOLOG MIX 70/30) injection 35 Units  35 Units Subcutaneous Q breakfast [name], MD   35 Units at 01/08/15 0801  . insulin aspart protamine- aspart (NOVOLOG MIX 70/30) injection 40  Units  40 Units Subcutaneous Q supper [name], MD   40 Units at 01/07/15 1706  . insulin detemir (LEVEMIR) injection 20 Units  20 Units Subcutaneous QHS [name], MD   20 Units at 01/07/15 2204  . lisinopril (PRINIVIL,ZESTRIL) tablet 20 mg  20 mg Oral Q breakfast Jolanta B Pucilowska, MD   20 mg at 01/08/15 0801  . magnesium hydroxide (MILK OF MAGNESIA) suspension 30 mL  30 mL Oral Daily PRN [name], MD      . metFORMIN (GLUCOPHAGE) tablet 1,000 mg  1,000 mg Oral BID WC [name], MD   1,000 mg at 01/08/15 0802  . risperiDONE (RISPERDAL) tablet 3 mg  3 mg Oral QHS Jolanta B Pucilowska, MD   3 mg at 01/07/15 2204  . zolpidem (AMBIEN) tablet 10 mg  10 mg Oral QHS Jolanta B Pucilowska, MD   10 mg at 01/06/15 2222    Lab Results:  Results for orders placed or performed during the hospital encounter of 01/04/15 (from the past 48 hour(s))  Glucose, capillary     Status: Abnormal   Collection Time: 01/06/15  4:49 PM  Result Value Ref Range   Glucose-Capillary 134 (H) 65 - 99 mg/dL   Comment 1 Notify RN   Glucose, capillary     Status: Abnormal   Collection Time: 01/06/15  9:09 PM  Result Value Ref Range   Glucose-Capillary 105 (H) 65 - 99 mg/dL  Glucose, capillary     Status: Abnormal   Collection Time: 01/07/15  6:48 AM  Result Value Ref Range   Glucose-Capillary 122 (H) 65 - 99 mg/dL  Glucose, capillary     Status: Abnormal   Collection Time: 01/07/15 11:54 AM  Result Value Ref Range   Glucose-Capillary 126 (H) 65 - 99 mg/dL  Glucose, capillary     Status: Abnormal   Collection Time: 01/07/15  5:03 PM  Result Value Ref Range   Glucose-Capillary 128 (H) 65 - 99 mg/dL  Glucose, capillary     Status: Abnormal   Collection Time: 01/07/15  8:43 PM  Result Value Ref Range   Glucose-Capillary 64 (L) 65 - 99 mg/dL   Comment 1 Notify RN   Glucose, capillary     Status: None   Collection Time: 01/07/15  9:18 PM  Result Value Ref Range   Glucose-Capillary 88 65  - 99 mg/dL  Glucose, capillary     Status: Abnormal   Collection Time: 01/08/15  6:55 AM  Result Value Ref Range   Glucose-Capillary 154 (H) 65 -  99 mg/dL   Comment 1 Notify RN   Glucose, capillary     Status: Abnormal   Collection Time: 01/08/15 11:49 AM  Result Value Ref Range   Glucose-Capillary 128 (H) 65 - 99 mg/dL    Physical Findings: AIMS: Facial and Oral Movements Muscles of Facial Expression: None, normal Lips and Perioral Area: None, normal Jaw: None, normal Tongue: None, normal,Extremity Movements Upper (arms, wrists, hands, fingers): None, normal Lower (legs, knees, ankles, toes): None, normal, Trunk Movements Neck, shoulders, hips: None, normal, Overall Severity Severity of abnormal movements (highest score from questions above): None, normal Incapacitation due to abnormal movements: None, normal Patient's awareness of abnormal movements (rate only patient's report): No Awareness, Dental Status Current problems with teeth and/or dentures?: No Does patient usually wear dentures?: No  CIWA:    COWS:     Musculoskeletal: Strength & Muscle Tone: within normal limits Gait & Station: normal Patient leans: N/A  Psychiatric Specialty Exam: Review of Systems  Constitutional: Negative.   HENT: Negative.   Eyes: Negative.   Respiratory: Negative.   Cardiovascular: Negative.   Gastrointestinal: Negative.   Musculoskeletal: Negative.   Skin: Negative.   Neurological: Negative.   Psychiatric/Behavioral: Positive for depression. Negative for suicidal ideas, hallucinations, memory loss and substance abuse. The patient is not nervous/anxious and does not have insomnia.   All other systems reviewed and are negative.   Blood pressure 149/93, pulse 81, temperature 97.6 F (36.4 C), temperature source Oral, resp. rate 20, height 5\' 8"  (1.727 m), weight 112.492 kg (248 lb), SpO2 100 %.Body mass index is 37.72 kg/(m^2).  General Appearance: Casual  Eye Contact::  Fair   Speech:  Clear and Coherent  Volume:  Normal  Mood:  Depressed  Affect:  Blunt  Thought Process:  Goal Directed  Orientation:  Full (Time, Place, and Person)  Thought Content:  Hallucinations: Auditory  Suicidal Thoughts:  Yes.  with intent/plan  Homicidal Thoughts:  No  Memory:  Immediate;   Fair Recent;   Fair Remote;   Fair  Judgement:  Poor  Insight:  Lacking  Psychomotor Activity:  Normal  Concentration:  Fair  Recall:  [employer] of Knowledge:Fair  Language: Fair  Akathisia:  No  Handed:  Right  AIMS (if indicated):     Assets:  Communication Skills Desire for Improvement Financial Resources/Insurance Housing Resilience Social Support  ADL's:  Intact  Cognition: WNL  Sleep:  Number of Hours: 6.25   Treatment Plan Summary: Daily contact with patient to assess and evaluate symptoms and progress in treatment and Medication management   [name] is a 54 year old male with a hiostory of depression admitted for suicidal ideation in the context of major loss.  1. Suicidal ideation. He is able to contract for safety in the hospital. Seems to be getting a little bit better. Not having acute suicidal threats at this point.  2. Mood. He was restarted on Wellbutrin for depression. We increase Risperdal to 3 mg for hallucinations. Mood seems improved with no clear thought disorder. 3. HTN. We continue Lisinopril.  4. Diabetes. We continue metformin, insulin with ADA diet and SSI. In addition to 70/30 Novolog, he should be getting 20 units of Levemir nightly. Patient will be switched back to his regular diet as his request  5. Insomnia. We started Ambien.   6. Back pain. We offered ibuprofen.   7. Substance abuse. The patient is positive for cocaine on admission. There is a history of substance use the patient denies  any current use. He is not interested in substance abuse treatment.  8. Disposition. He will be discharged to home in [place]. He will follow up with  his local provider. Possible discharge within the next couple days. Patient is hoping to get back down toward [place]. Patient feels better getting a regular diet. [place] fine. No other change to his treatment or medication. Supportive counseling completed. He is not actively suicidal but still reports feeling down and depressed. Vital signs fairly stable.  Vivian Delila Kuklinski 01/08/2015, 1:49 PM

## 2015-01-08 NOTE — Progress Notes (Signed)
[name] has been pleasant and cooperative on shift. He has been medication compliant, he spent most of the evening in the dayroom with peers. He denies suicdial ideation. He remains depressed. He was able to use coping skills when he had verbal disagreement with a peer and he reported he felt like hurting someone, but he was able to come notify staff and talk about it and calm down. He is in bed resting at this time.

## 2015-01-08 NOTE — Plan of Care (Signed)
Problem: Ineffective individual coping Goal: STG: Patient will remain free from self harm Outcome: Progressing Patient has remained free from self harm this shift

## 2015-01-09 LAB — GLUCOSE, CAPILLARY
Glucose-Capillary: 102 mg/dL — ABNORMAL HIGH (ref 65–99)
Glucose-Capillary: 150 mg/dL — ABNORMAL HIGH (ref 65–99)
Glucose-Capillary: 149 mg/dL — ABNORMAL HIGH (ref 65–99)

## 2015-01-09 MED ORDER — LEVEMIR FLEXTOUCH 100 UNIT/ML ~~LOC~~ SOPN: 20.0000 [IU] | PEN_INJECTOR | Freq: Every day | SUBCUTANEOUS | Status: DC

## 2015-01-09 MED ORDER — LISINOPRIL 20 MG PO TABS: 20.0000 mg | ORAL_TABLET | Freq: Every day | ORAL | Status: DC

## 2015-01-09 MED ORDER — METFORMIN HCL 1000 MG PO TABS: 1000.0000 mg | ORAL_TABLET | Freq: Two times a day (BID) | ORAL | Status: DC

## 2015-01-09 NOTE — BHH Group Notes (Signed)
[facility] Group Notes:  (Nursing/MHT/Case Management/Adjunct)  Date:  01/09/2015  Time:  1:21 AM  Type of Therapy:  Group Therapy  Participation Level:  Active  Participation Quality:  Appropriate  Affect:  Appropriate  Cognitive:  Appropriate  Insight:  Appropriate  Engagement in Group:  Engaged  Modes of Intervention:  n/a  Summary of Progress/Problems:  [name] 01/09/2015, 1:21 AM

## 2015-01-09 NOTE — BHH Suicide Risk Assessment (Signed)
[facility] Discharge Suicide Risk Assessment   Demographic Factors:  Male and Living alone  Total Time spent with patient: 30 minutes  Musculoskeletal: Strength & Muscle Tone: within normal limits Gait & Station: normal Patient leans: N/A  Psychiatric Specialty Exam: Physical Exam  Nursing note and vitals reviewed.   Review of Systems  All other systems reviewed and are negative.   Blood pressure 127/81, pulse 99, temperature 98.7 F (37.1 C), temperature source Oral, resp. rate 20, height 5\' 8"  (1.727 m), weight 112.492 kg (248 lb), SpO2 100 %.Body mass index is 37.72 kg/(m^2).  General Appearance: Casual  Eye Contact::  Good  Speech:  Clear and [id number]  Volume:  Normal  Mood:  Euthymic  Affect:  Appropriate  Thought Process:  Goal Directed  Orientation:  Full (Time, Place, and Person)  Thought Content:  WDL  Suicidal Thoughts:  No  Homicidal Thoughts:  No  Memory:  Immediate;   Fair Recent;   Fair Remote;   Fair  Judgement:  Fair  Insight:  Fair  Psychomotor Activity:  Normal  Concentration:  Fair  Recall:  [employer] [place]  Language: Fair  Akathisia:  No  Handed:  Right  AIMS (if indicated):     Assets:  Communication Skills Desire for Improvement Financial Resources/Insurance Housing Resilience Social Support Transportation  Sleep:  Number of Hours: 6.75  Cognition: WNL  ADL's:  Intact   Have you used any form of tobacco in the last 30 days? (Cigarettes, Smokeless Tobacco, Cigars, and/or Pipes): No  Has this patient used any form of tobacco in the last 30 days? (Cigarettes, Smokeless Tobacco, Cigars, and/or Pipes) No  Mental Status Per Nursing Assessment::   On Admission:  NA  Current Mental Status by Physician: NA  Loss Factors: Loss of significant relationship  Historical Factors: Prior suicide attempts and Impulsivity  Risk Reduction Factors:   Sense of responsibility to family, Positive social support and Positive therapeutic  relationship  Continued Clinical Symptoms:  Depression:   Comorbid alcohol abuse/dependence Impulsivity Severe Alcohol/Substance Abuse/Dependencies  Cognitive Features That Contribute To Risk:  None    Suicide Risk:  Minimal: No identifiable suicidal ideation.  Patients presenting with no risk factors but with morbid ruminations; may be classified as minimal risk based on the severity of the depressive symptoms  Principal Problem: Severe recurrent major depression with psychotic features [facility]) Discharge Diagnoses:  Patient Active Problem List   Diagnosis Date Noted  . Cocaine use disorder, moderate, dependence ([place]) [F14.20] 01/04/2015  . Severe recurrent major depression with psychotic features ([place]) [F33.3] 01/04/2015  . Depression, major, recurrent, severe with psychosis ([place]) [F33.3] 08/29/2013  . Suicidal ideations [R45.851] 08/28/2013  . Chest pain [R07.9] 06/30/2013  . HTN (hypertension) [I10] 06/30/2013  . Uncontrolled diabetes mellitus ([place]) [E11.65] 06/30/2013  . Bipolar affect, depressed ([place]) [F31.30] 05/13/2011      Plan Of Care/Follow-up recommendations:  Activity:  As tolerated. Diet:  Low sodium heart healthy ADA diet. Other:  Keep follow-up appointments.  Is patient on multiple antipsychotic therapies at discharge:  No   Has Patient had three or more failed trials of antipsychotic monotherapy by history:  No  Recommended Plan for Multiple Antipsychotic Therapies: NA    Roxsana Riding 01/09/2015, 9:51 AM

## 2015-01-09 NOTE — Plan of Care (Signed)
Problem: [facility] Participation in Recreation Therapeutic Interventions Goal: STG-Patient will identify at least five coping skills for ** STG: Coping Skills - Within 4 treatment sessions, patient will verbalize at least 5 coping skills for substance abuse in each of 2 treatment sessions to decrease substance abuse post d/c.  Outcome: Completed/Met Date Met:  01/09/15 Treatment Session 2; Completed 2 out of 2: At approximately 1:05 pm, LRT met with patient in craft room. Patient verbalized 5 coping skills for substance abuse. LRT encouraged patient to participate in leisure activities. Intervention Used: Coping Skills worksheet  Leonette Monarch, LRT/CTRS 11.14.16 2:21 pm Goal: STG-Other Recreation Therapy Goal (Specify) STG: Stress Management - Within 4 treatment sessions, patient will verbalize understanding of the stress management techniques in each of 2 treatment sessions to increase stress management post d/c.  Outcome: Completed/Met Date Met:  01/09/15 Treatment Session 2; Completed 2 out of 2: At approximately 1:05 pm, LRT met with patient in craft room. Patient reported he read over the stress management techniques. Patient verbalized understanding. LRT encouraged patient to practice the stress management techniques. Intervention Used: Mindfulness and Progressive Muscle Relaxation handouts  Leonette Monarch, LRT/CTRS 11.14.16 2:23 pm

## 2015-01-09 NOTE — Progress Notes (Signed)
Recreation Therapy Notes  INPATIENT RECREATION TR PLAN  Patient Details Name: [name] MRN: [id number] DOB: 09-19-1960 Today's Date: 01/09/2015  Rec Therapy Plan Is patient appropriate for Therapeutic Recreation?: Yes Treatment times per week: At least once a week TR Treatment/Interventions: 1:1 session, Group participation (Comment) (Appropriate participation in daily recreation therapy tx)  Discharge Criteria Pt will be discharged from therapy if:: Treatment goals are met, Discharged Treatment plan/goals/alternatives discussed and agreed upon by:: Patient/family  Discharge Summary Short term goals set: See Care Plan Short term goals met: Complete Progress toward goals comments: One-to-one attended One-to-one attended: Stress management, coping skills Reason goals not met: N/A Therapeutic equipment acquired: None Reason patient discharged from therapy: Treatment goals met Pt/family agrees with progress & goals achieved: Yes Date patient discharged from therapy: 01/09/15   Leonette Monarch, LRT/CTRS 01/09/2015, 4:54 PM

## 2015-01-09 NOTE — Progress Notes (Signed)
D: pt aware of discharge this shift, pt denies suicidal ideation or homicidal ideation, pt calm and cooperative, no distress noted  A: all personal items in locker returned to patient, instructions given on discharge information, received prescriptions and follow up appointment  R: patient states he will comply with outpatient services and medications as prescribed.  Patient left via his own car.

## 2015-01-09 NOTE — Progress Notes (Signed)
D: Patient denies SI/HI/AVH.  Patient affect is appropriate and his mood is pleasant.  Patient did attend evening group. Patient visible on the milieu. No distress noted. A: Support and encouragement offered. Scheduled medications given to pt. Q 15 min checks continued for patient safety. R: Patient receptive. Patient remains safe on the unit.

## 2015-01-09 NOTE — Discharge Summary (Addendum)
Physician Discharge Summary Note  Patient:  [name] is an 54 y.o., male MRN:  [id number] DOB:  November 03, 1960 Patient phone:  [phone] (home)  Patient address:   [place] [address],  Total Time spent with patient: 30 minutes  Date of Admission:  01/04/2015 Date of Discharge: 01/09/2015  Reason for Admission:  Suicidal ideation.  Identifying data. [name] is a 54 year old male with history of depression.  Chief complaint. "I lost my brother, he was my hero."  History of present illness. Information was obtained from the patient's chart. [name] has a long history of depression with multiple suicide attempts in the past. He has not been taking any antidepressants lately. He attended the funeral of his brother in [place] Last weekend. She was driving back home to [place] he became increasingly emotional thinking that he should be that that one of his brother. He started thinking about driving his car off the road and came to the hospital asking for help. Says his brother death the patient has been increasingly depressed with poor sleep of only 2 hours a night, decreased appetite, anhedonia, feeling of guilt and hopelessness worthlessness, poor energy and concentration, social isolation, crying spells, auditory command hallucinations telling him to kill himself. There are no complaints of heightened anxiety. He denies symptoms suggestive of bipolar mania. He denies any recent alcohol or cocaine use but is positive for cocaine.  Past psychiatric history. Long history of depression with multiple psychiatric hospitalizations. The patient is unable to tell me how many. He only remembers taking Zoloft which made him sick. There is information about him taking Wellbutrin and Cymbalta in the chart but the patient does not remember it. He had several suicide attempts using a gun that misfired and jumping off the bridge. There is a history of cocaine and alcohol use but the  patient denies any current problems.   Family psychiatric history. Nonreported.  Social history. He is disabled from industrial accident. He is still married by separated from his wife. He lives alone in [place].   Principal Problem: Severe recurrent major depression with psychotic features [facility]) Discharge Diagnoses: Patient Active Problem List   Diagnosis Date Noted  . Cocaine use disorder, moderate, dependence ([place]) [F14.20] 01/04/2015  . Severe recurrent major depression with psychotic features ([place]) [F33.3] 01/04/2015  . Depression, major, recurrent, severe with psychosis ([place]) [F33.3] 08/29/2013  . Suicidal ideations [R45.851] 08/28/2013  . Chest pain [R07.9] 06/30/2013  . HTN (hypertension) [I10] 06/30/2013  . Uncontrolled diabetes mellitus ([place]) [E11.65] 06/30/2013  . Bipolar affect, depressed ([place]) [F31.30] 05/13/2011    Musculoskeletal: Strength & Muscle Tone: within normal limits Gait & Station: normal Patient leans: N/A  Psychiatric Specialty Exam: Physical Exam  Nursing note and vitals reviewed.   Review of Systems  All other systems reviewed and are negative.   Blood pressure 127/81, pulse 99, temperature 98.7 F (37.1 C), temperature source Oral, resp. rate 20, height 5\' 8"  (1.727 m), weight 112.492 kg (248 lb), SpO2 100 %.Body mass index is 37.72 kg/(m^2).  See SRA.                                                  Sleep:  Number of Hours: 6.75   Have you used any form of tobacco in the last 30 days? (Cigarettes, Smokeless Tobacco, Cigars, and/or  Pipes): No  Has this patient used any form of tobacco in the last 30 days? (Cigarettes, Smokeless Tobacco, Cigars, and/or Pipes) No  Past Medical History:  Past Medical History  Diagnosis Date  . Diabetes mellitus     uncontrolled   . Hypertension   . Bipolar affective ([place])   . Depression     Past Surgical History  Procedure Laterality Date  . Knee surgery      b/l knees; pending  total knee    Family History:  Family History  Problem Relation Age of Onset  . Diabetes    . Cancer Mother     uterus; deceased   . Hypertension    . Heart attack Father     alive   . Stroke Sister     12/2012 age 61 y.o   Social History:  History  Alcohol Use No     History  Drug Use  . Yes  . Special: Cocaine    Social History   Social History  . Marital Status: Legally Separated    Spouse Name: N/A  . Number of Children: N/A  . Years of Education: N/A   Social History Main Topics  . Smoking status: Former Research scientist (life sciences)  . Smokeless tobacco: None  . Alcohol Use: No  . Drug Use: Yes    Special: Cocaine  . Sexual Activity: Yes    Birth Control/ Protection: Condom   Other Topics Concern  . 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 Last date of use was 3 months ago. First use was at 54 yo. Risk to Others:   Prior Inpatient Therapy:   Prior Outpatient Therapy:    Level of Care:  OP  Hospital Course:    [name] is a 54 year old male with a hiostory of depression admitted for suicidal ideation in the context of major loss and cocaine use.  1. Suicidal ideation. This has resolved. The patient is able to contract for safety.   2. Mood. He was restarted on Wellbutrin for depression. We added Risperdal for psychosis.   3. HTN. We continued Lisinopril.  4. Diabetes. We continued metformin,Levemir and 70/30  Novolog along with ADA diet and SSI.   5. Insomnia. We restarted Ambien.    6. Back pain. We offered ibuprofen.   7. Substance abuse. The patient is positive for cocaine on admission. There is a history of substance use. He is not interested in substance abuse treatment.  8. Disposition. He was discharged to the [facility] in [place],  a R I will discontinue this nothing by mouth Army he is asleep. He will follow up with a local provider.   Consults:  None  Significant Diagnostic Studies:  None  Discharge Vitals:   Blood pressure 127/81, pulse 99, temperature 98.7 F (37.1 C), temperature source Oral, resp. rate 20,  height 5\' 8"  (1.727 m), weight 112.492 kg (248 lb), SpO2 100 %. Body mass index is 37.72 kg/(m^2). Lab Results:   Results for orders placed or performed during the hospital encounter of 01/04/15 (from the past 72 hour(s))  Glucose, capillary     Status: None   Collection Time: 01/06/15 11:50 AM  Result Value Ref Range   Glucose-Capillary 96 65 - 99 mg/dL  Glucose, capillary     Status: Abnormal   Collection Time: 01/06/15  4:49 PM  Result Value Ref Range   Glucose-Capillary 134 (H) 65 - 99 mg/dL   Comment 1 Notify RN   Glucose, capillary     Status: Abnormal   Collection Time: 01/06/15  9:09 PM  Result Value Ref Range   Glucose-Capillary 105 (H) 65 - 99 mg/dL  Glucose, capillary     Status: Abnormal   Collection Time: 01/07/15  6:48 AM  Result Value Ref Range   Glucose-Capillary 122 (H) 65 - 99 mg/dL  Glucose, capillary     Status: Abnormal   Collection Time: 01/07/15 11:54 AM  Result Value Ref Range   Glucose-Capillary 126 (H) 65 - 99 mg/dL  Glucose, capillary     Status: Abnormal   Collection Time: 01/07/15  5:03 PM  Result Value Ref Range   Glucose-Capillary 128 (H) 65 - 99 mg/dL  Glucose, capillary     Status: Abnormal   Collection Time: 01/07/15  8:43 PM  Result Value Ref Range   Glucose-Capillary 64 (L) 65 - 99 mg/dL   Comment 1 Notify RN    Glucose, capillary     Status: None   Collection Time: 01/07/15  9:18 PM  Result Value Ref Range   Glucose-Capillary 88 65 - 99 mg/dL  Glucose, capillary     Status: Abnormal   Collection Time: 01/08/15  6:55 AM  Result Value Ref Range   Glucose-Capillary 154 (H) 65 - 99 mg/dL   Comment 1 Notify RN   Glucose, capillary     Status: Abnormal   Collection Time: 01/08/15 11:49 AM  Result Value Ref Range   Glucose-Capillary 128 (H) 65 - 99 mg/dL  Glucose, capillary     Status: Abnormal   Collection Time: 01/08/15  4:45 PM  Result Value Ref Range   Glucose-Capillary 175 (H) 65 - 99 mg/dL  Glucose, capillary     Status: Abnormal   Collection Time: 01/08/15  9:01 PM  Result Value Ref Range   Glucose-Capillary 127 (H) 65 - 99 mg/dL   Comment 1 Notify RN   Glucose, capillary     Status: Abnormal   Collection Time: 01/09/15  6:54 AM  Result Value Ref Range   Glucose-Capillary 102 (H) 65 - 99 mg/dL   Comment 1 Notify RN     Physical Findings: AIMS: Facial and Oral Movements Muscles of Facial Expression: None, normal Lips and Perioral Area: None, normal Jaw: None, normal Tongue: None, normal,Extremity Movements Upper (arms, wrists, hands, fingers): None, normal Lower (legs, knees, ankles, toes): None, normal, Trunk Movements Neck, shoulders, hips: None, normal, Overall Severity Severity of abnormal movements (highest score from questions above): None, normal Incapacitation due to abnormal movements: None, normal Patient's awareness of abnormal movements (rate only patient's report): No Awareness, Dental Status Current problems with teeth and/or dentures?: No Does patient usually wear dentures?: No  CIWA:    COWS:      See Psychiatric Specialty Exam and Suicide Risk Assessment completed by Attending Physician prior to discharge.  Discharge destination:  Home  Is patient on multiple antipsychotic therapies at discharge:  No   Has Patient had three or more failed trials of  antipsychotic monotherapy by history:  No    Recommended Plan for Multiple Antipsychotic Therapies: NA  Discharge Instructions    Diet - low sodium heart healthy    Complete by:  As directed      Increase activity slowly    Complete by:  As directed             Medication List    STOP taking these medications        buPROPion 75 MG tablet  Commonly known as:  WELLBUTRIN  Replaced by:  buPROPion 150 MG 24 hr tablet     DULoxetine 60 MG capsule  Commonly known as:  CYMBALTA     gabapentin 400 MG capsule  Commonly known as:  NEURONTIN     nicotine 21 mg/24hr patch  Commonly known as:  NICODERM CQ - dosed in mg/24 hours     traMADol 50 MG tablet  Commonly known as:  ULTRAM      TAKE these medications      Indication   buPROPion 150 MG 24 hr tablet  Commonly known as:  WELLBUTRIN XL  Take 1 tablet (150 mg total) by mouth daily with breakfast.   Indication:  Major Depressive Disorder     ibuprofen 800 MG tablet  Commonly known as:  ADVIL,MOTRIN  Take 800 mg by mouth every 8 (eight) hours as needed for mild pain or moderate pain.      insulin aspart protamine- aspart (70-30) 100 UNIT/ML injection  Commonly known as:  NOVOLOG MIX 70/30  Inject 0.35 mLs (35 Units total) into the skin daily with breakfast.   Indication:  Type 2 Diabetes     insulin aspart protamine- aspart (70-30) 100 UNIT/ML injection  Commonly known as:  NOVOLOG MIX 70/30  Inject 0.4 mLs (40 Units total) into the skin daily with supper.   Indication:  Type 2 Diabetes     LEVEMIR FLEXTOUCH 100 UNIT/ML Pen  Generic drug:  Insulin Detemir  Inject 20 Units into the skin at bedtime. TO BE MANAGED BY HOSPITAL WITH DOSING   Indication:  Type 2 Diabetes     lisinopril 20 MG tablet  Commonly known as:  PRINIVIL,ZESTRIL  Take 1 tablet (20 mg total) by mouth daily. For hypertension   Indication:  High Blood Pressure     metFORMIN 1000 MG tablet  Commonly known as:  GLUCOPHAGE  Take 1 tablet (1,000  mg total) by mouth 2 (two) times daily with a meal. For diabetes control   Indication:  Type 2 Diabetes     risperiDONE 3 MG tablet  Commonly known as:  RISPERDAL  Take 1 tablet (3 mg total) by mouth at bedtime.   Indication:  Major Depressive Disorder     zolpidem 10 MG tablet  Commonly known as:  AMBIEN  Take 1 tablet (10 mg total) by mouth at bedtime.   Indication:  Trouble Sleeping         Follow-up recommendations:  Activity:  As tolerated. Diet:  Low sodium heart healthy ADA diet. Other:  Keep follow-up appointments.  Comments:    Total Discharge Time: 35 min.  Signed: Jillann Charette 01/09/2015, 10:08 AM

## 2015-01-09 NOTE — Progress Notes (Signed)
Recreation Therapy Notes  Date: 11.14.16 Time: 3:00 pm Location: Craft Room  Group Topic: Wellness  Goal Area(s) Addresses:  Patient will identify at least one item per dimension of health. Patient will examine areas they are deficient in.  Behavioral Response: Arrived late, Left early  Intervention: 6 Dimensions of Health  Activity: Patients were given a worksheet with the definitions of the 6 dimensions of health on it. Patients were given a worksheet with the 6 dimensions on it and instructed to list 2-3 things they were currently doing per each dimension.   Education: LRT educated patients on ways they can increase each dimension.  Education Outcome: Patient left before LRT educated group.   Clinical Observations/Feedback: Patient arrived to group at approximately 3:23 pm. Patient stated he only wanted to listen. Patient left group at approximately 3:25 pm. Patient did not return to group.  Leonette Monarch, LRT/CTRS 01/09/2015 4:27 PM

## 2015-01-09 NOTE — Progress Notes (Signed)
  [facility] Adult Case Management Discharge Plan :  Will you be returning to the same living situation after discharge:  No. Patient will drive to [facility] for shelter tonight and has an appt tomorrow 01/10/15 at an [facility] in [place]. At discharge, do you have transportation home?: Yes,  patient has his truck in the parking lot Do you have the ability to pay for your medications: Yes,  patient has Medicaid and Medicare  Release of information consent forms completed and in the chart;  Patient's signature needed at discharge.  Patient to Follow up at: Follow-up Information    Please follow up.   Contact information:          Follow up with [place], [place]. Go on 01/16/2015.   Why:  For follow-up care appt Monday 01/16/15 at 12:45pm. Bring your photo ID, insurance card, and medication list. Please call and give 24 hour notice of cancellation or rescheduling or you may not be seen for future visits.    Contact information:   [name], [place] [phone] Ph [phone] Fax [phone]  Hours M-F 8-6      Next level of care provider has access to [facility]  Patient denies SI/HI: Yes,  patient denies SI/HI    Safety Planning and Suicide Prevention discussed: Yes,  SPE discussed with patient but patient refused family contact  Have you used any form of tobacco in the last 30 days? (Cigarettes, Smokeless Tobacco, Cigars, and/or Pipes): No  Has patient been referred to the Quitline?: N/A patient is not a smoker  Carmell Austria T, MSW, LCSWA 01/09/2015, 4:52 PM

## 2015-01-09 NOTE — BHH Suicide Risk Assessment (Signed)
[facility] INPATIENT:  Family/Significant Other Suicide Prevention Education  Suicide Prevention Education:  Patient Refusal for Family/Significant Other Suicide Prevention Education: The patient [name] has refused to provide written consent for family/significant other to be provided Family/Significant Other Suicide Prevention Education during admission and/or prior to discharge.  Physician notified.  Keene Breath, MSW, LCSWA 01/09/2015, 4:52 PM

## 2015-01-09 NOTE — BHH Group Notes (Signed)
[facility] Group Notes:  (Nursing/MHT/Case Management/Adjunct)  Date:  01/09/2015  Time:  1:40 PM  Type of Therapy:  Psychoeducational Skills  Participation Level:  Active  Participation Quality:  Appropriate  Affect:  Appropriate  Cognitive:  Appropriate  Insight:  Appropriate  Engagement in Group:  Engaged  Modes of Intervention:  Discussion and Education  Summary of Progress/Problems:  Drake Leach 01/09/2015, 1:40 PM

## 2015-03-10 IMAGING — CR DG CHEST 2V
2 series · 2 of 2 positions shown · non-contrast
Comparison: 01/30/2010

CLINICAL DATA: Chest pain.

EXAM:
CHEST - 2 VIEW

[w chest pa]
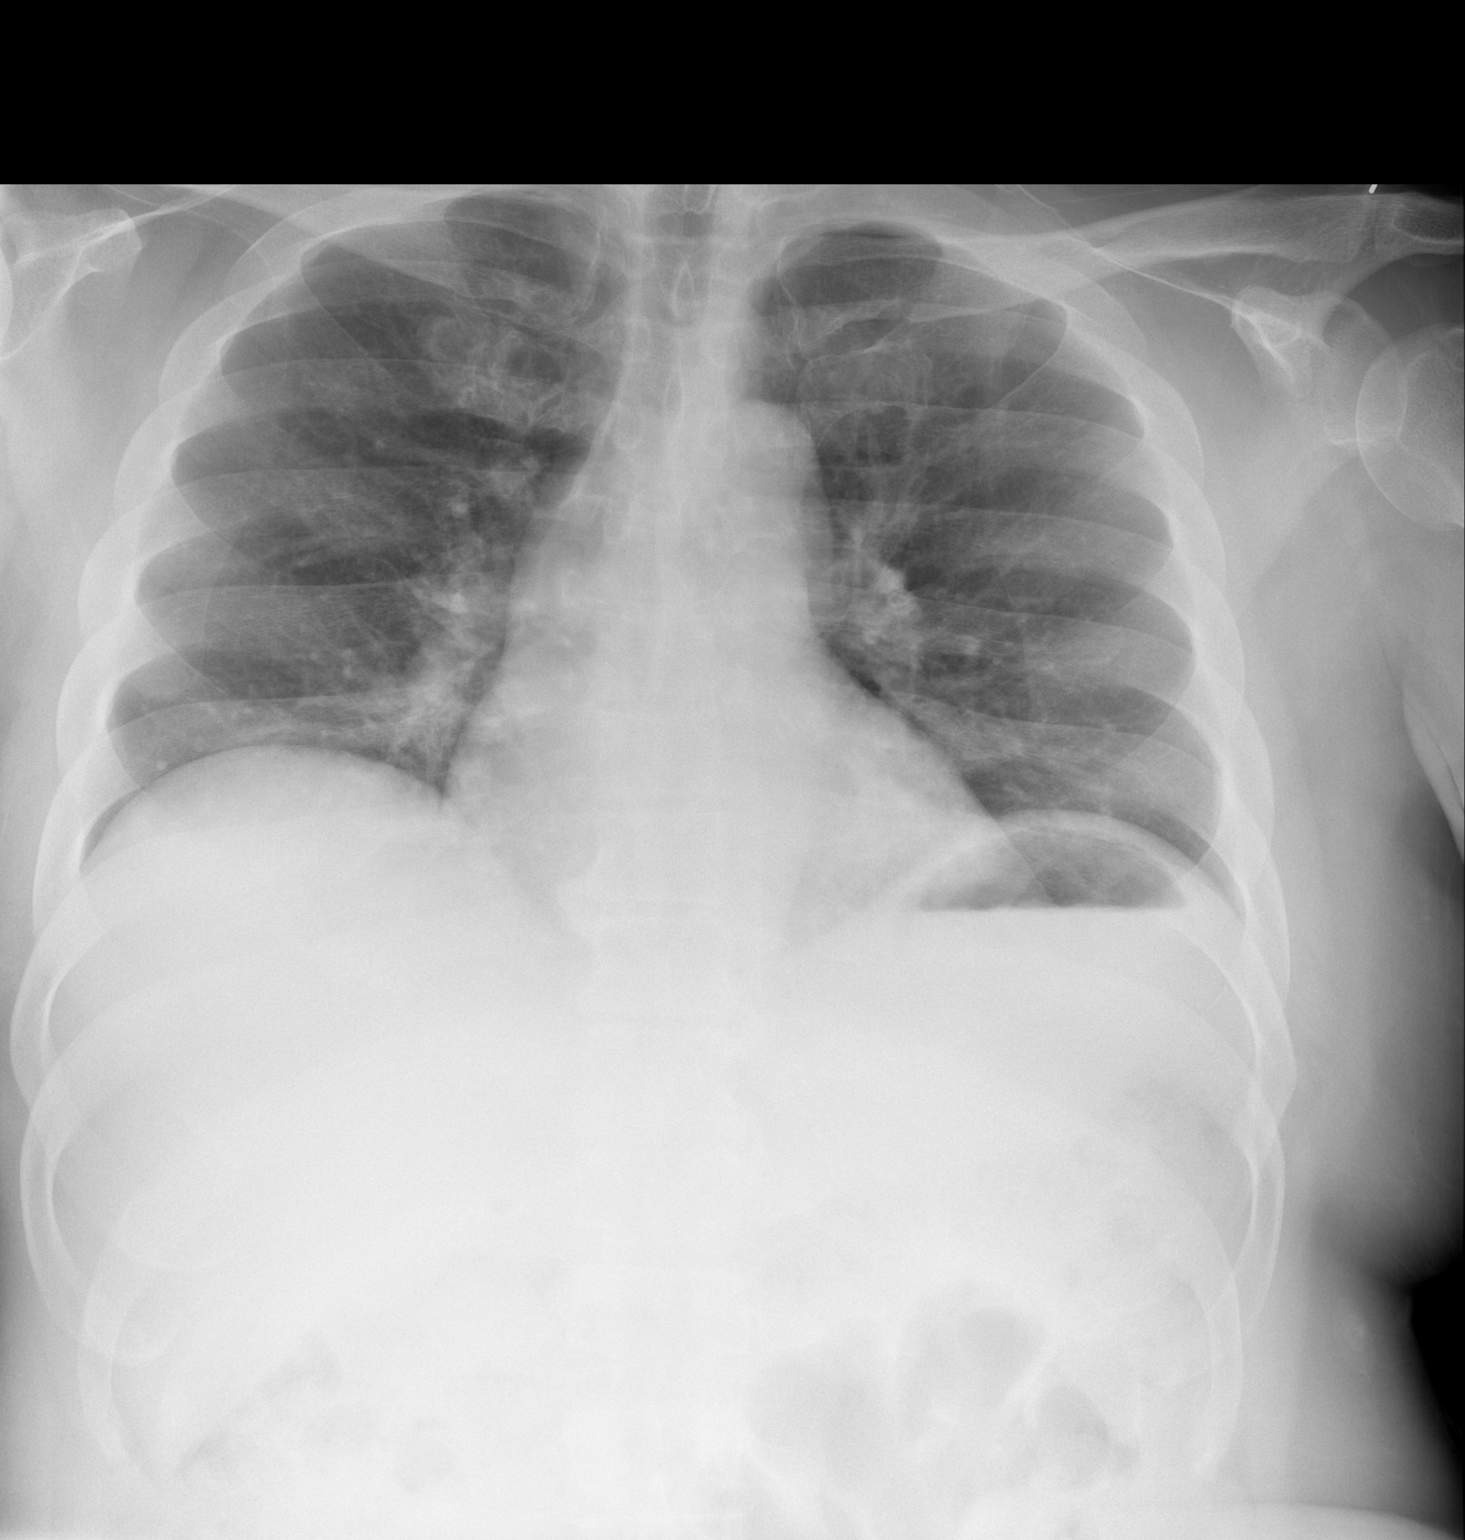

[w chest lat]
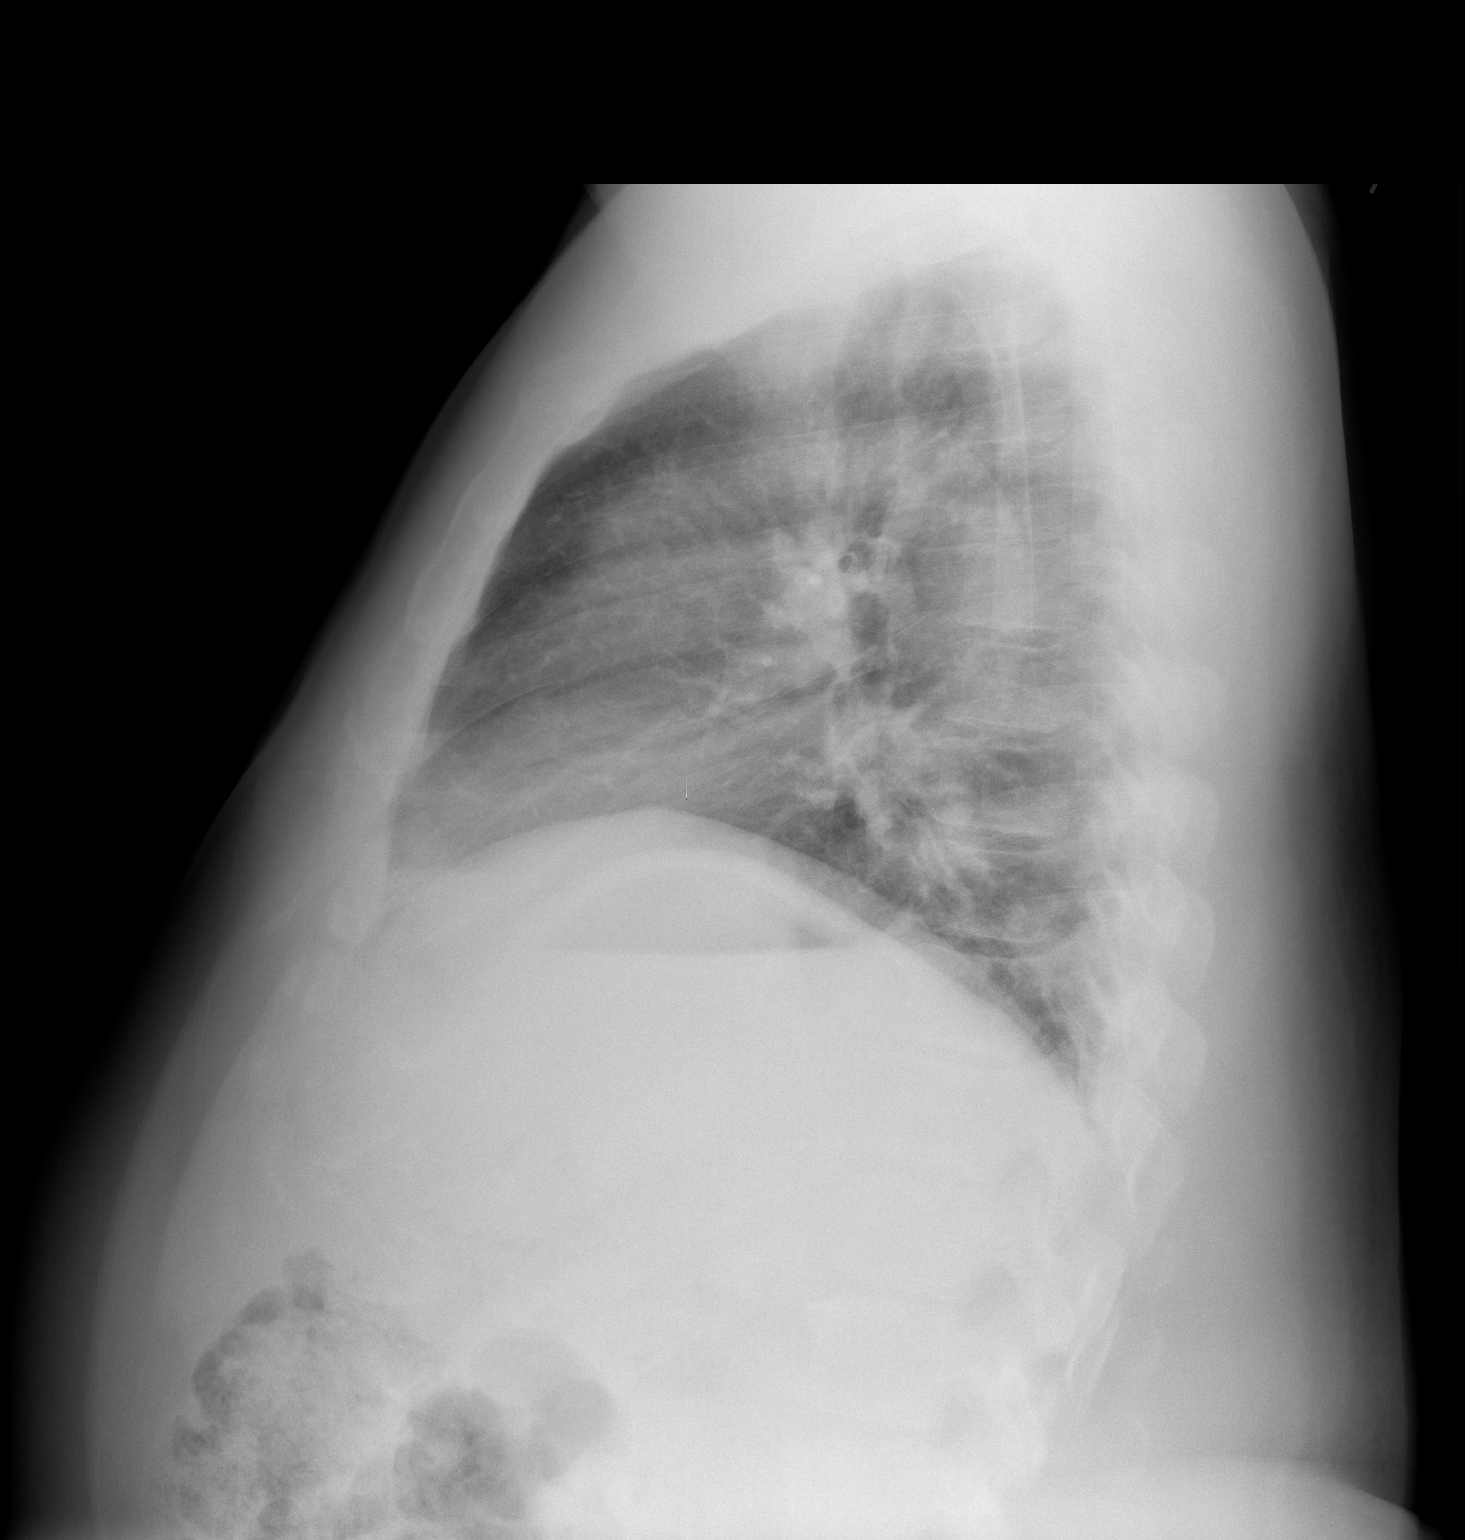

[2 of 2 positions shown; findings below may reference images not displayed]

FINDINGS: The heart size and mediastinal contours are within normal limits.
Lung volumes are low bilaterally with bibasilar atelectasis present.
There is no evidence of pulmonary edema, consolidation,
pneumothorax, nodule or pleural fluid. The visualized skeletal
structures are unremarkable.
IMPRESSION: Bibasilar atelectasis.  No active disease.

## 2015-03-12 IMAGING — CR DG CHEST 2V
2 series · 2 of 2 positions shown · non-contrast
Comparison: 06/30/2013

CLINICAL DATA: Chest pain and shortness of breath

EXAM:
CHEST  2 VIEW

[w chest pa]
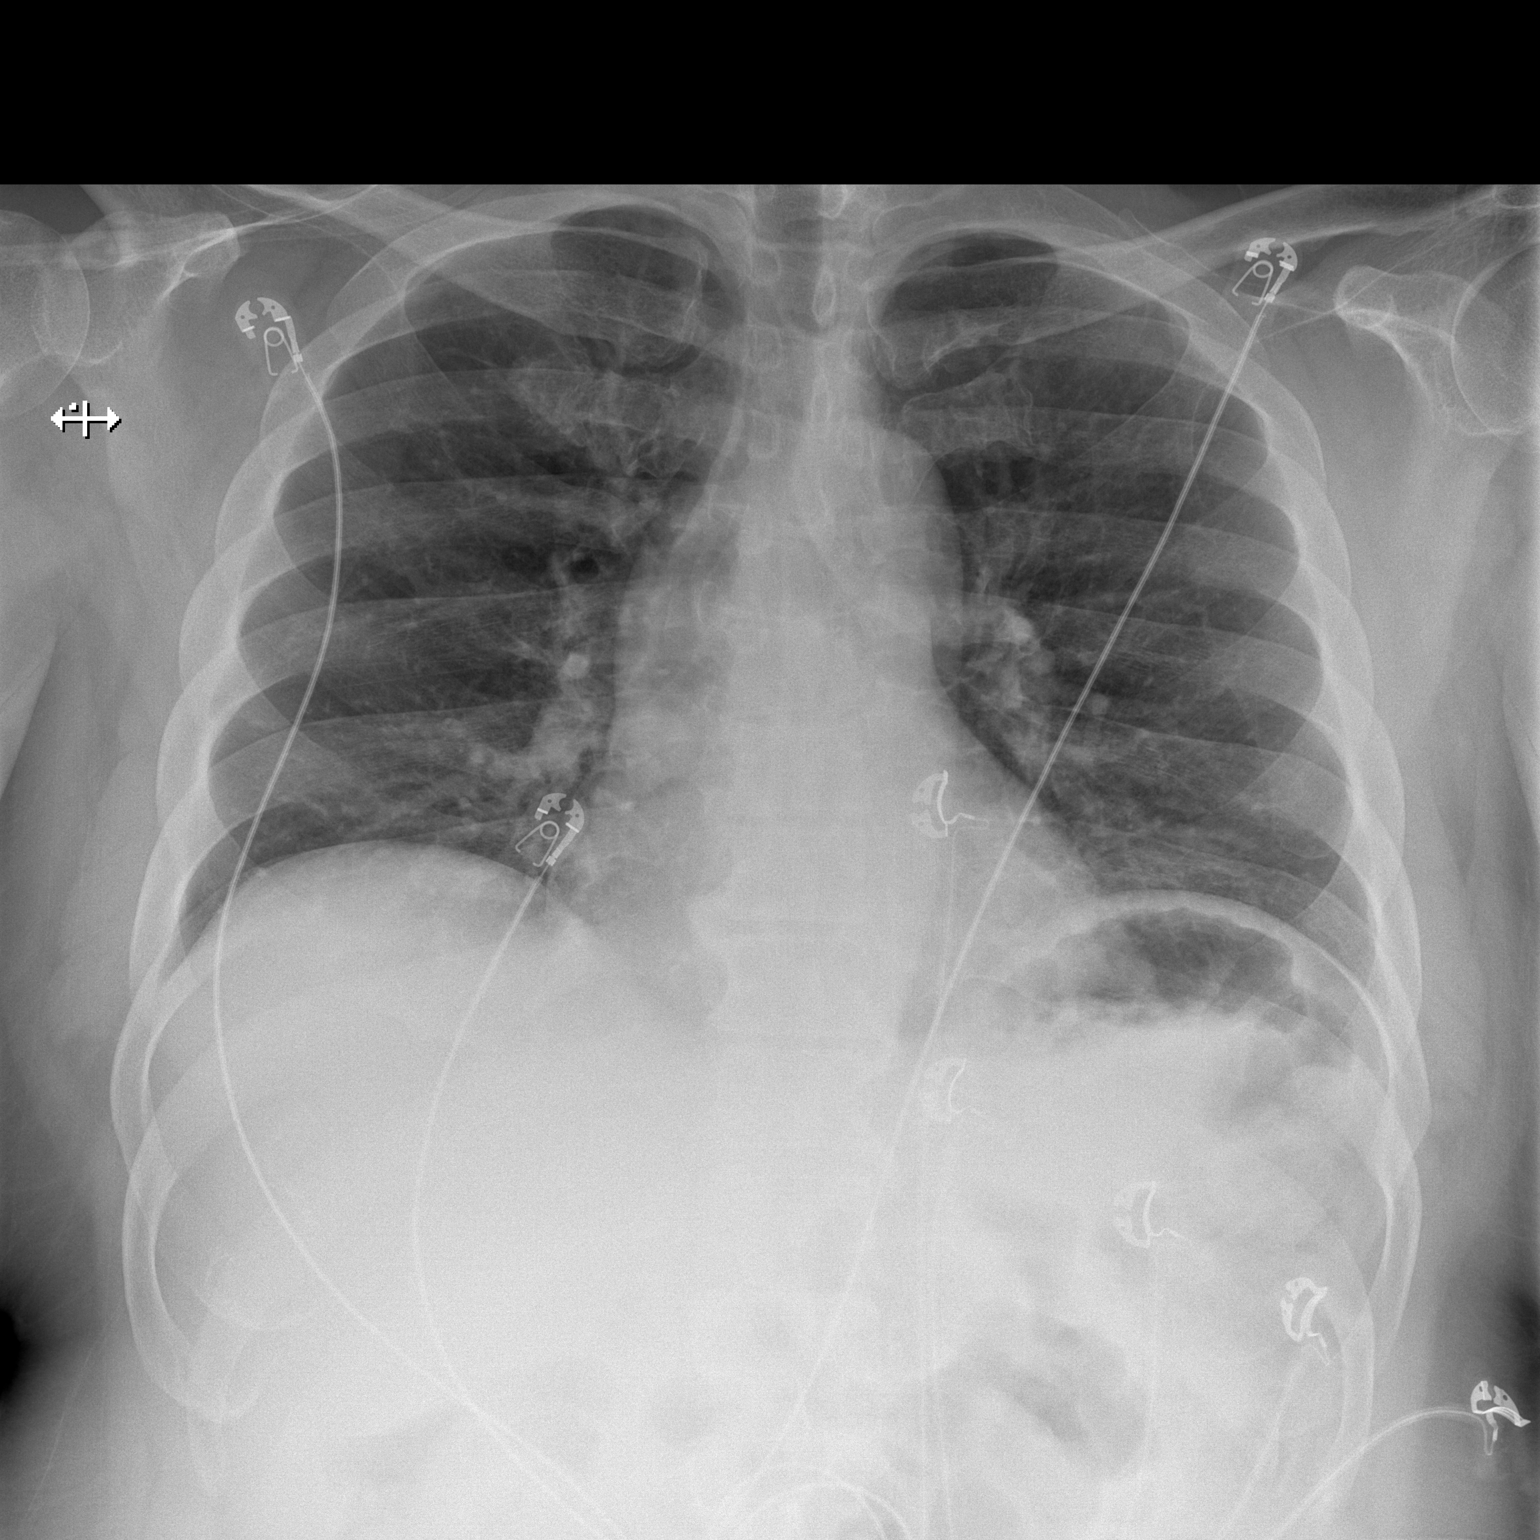

[w chest lat]
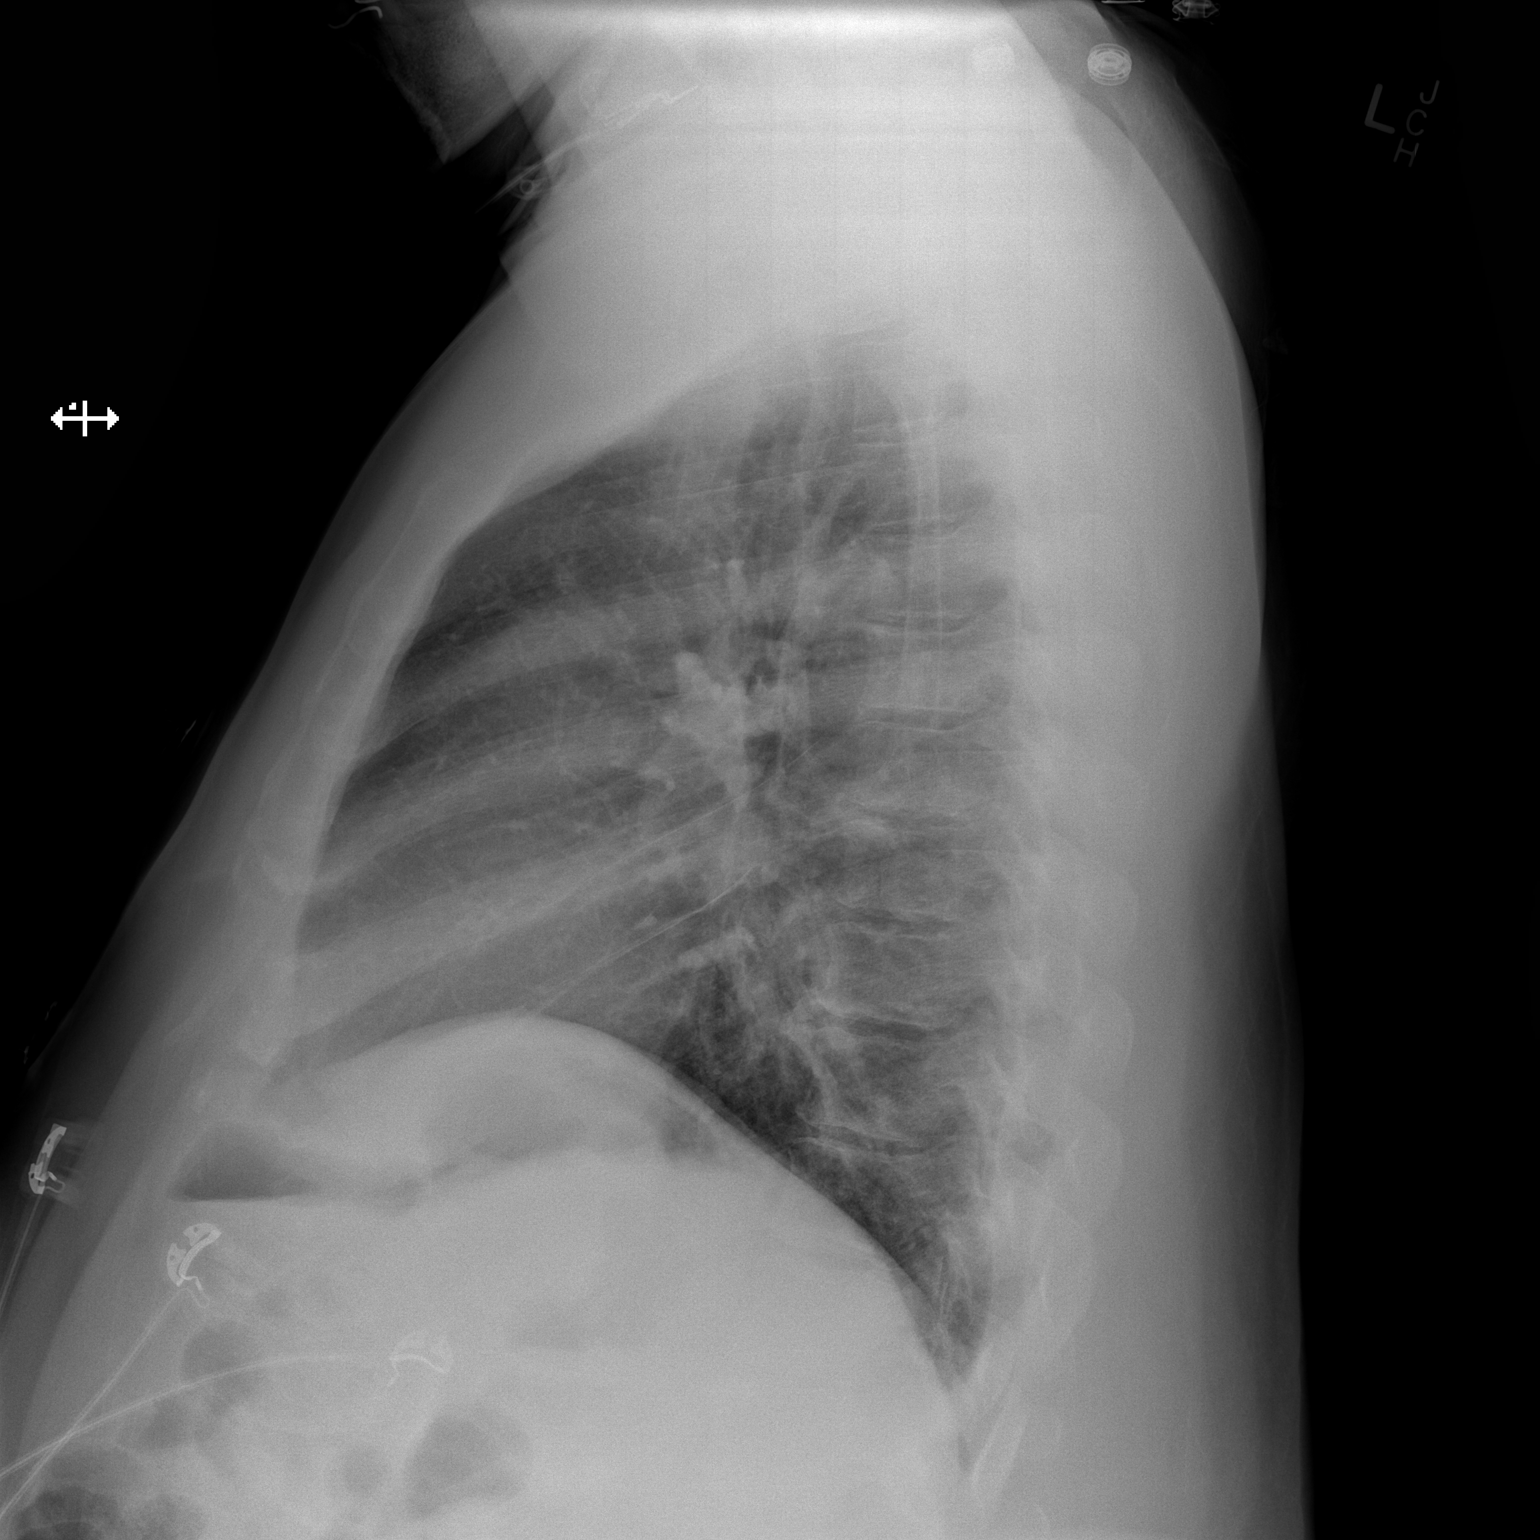

[2 of 2 positions shown; findings below may reference images not displayed]

FINDINGS: Borderline cardiomegaly, stable from prior. Normal upper mediastinal
contours. Mild hypoaeration with interstitial crowding. No edema or
consolidation. No effusion or pneumothorax.
IMPRESSION: No active cardiopulmonary disease.

## 2015-04-12 DIAGNOSIS — M1990 Unspecified osteoarthritis, unspecified site: Secondary | ICD-10-CM | POA: Insufficient documentation

## 2015-11-11 ENCOUNTER — Emergency Department (HOSPITAL_COMMUNITY): Payer: Medicare Other

## 2015-11-11 ENCOUNTER — Encounter (HOSPITAL_COMMUNITY): Payer: Self-pay | Admitting: *Deleted

## 2015-11-11 ENCOUNTER — Emergency Department (HOSPITAL_COMMUNITY)
Admission: EM | Admit: 2015-11-11 | Discharge: 2015-11-11 | Disposition: A | Discharge status: Home / Self Care | Payer: Medicare Other | Attending: Emergency Medicine | Admitting: Emergency Medicine

## 2015-11-11 DIAGNOSIS — Z87891 Personal history of nicotine dependence: Secondary | ICD-10-CM | POA: Insufficient documentation

## 2015-11-11 DIAGNOSIS — Z794 Long term (current) use of insulin: Secondary | ICD-10-CM | POA: Diagnosis not present

## 2015-11-11 DIAGNOSIS — R0789 Other chest pain: Secondary | ICD-10-CM

## 2015-11-11 DIAGNOSIS — E119 Type 2 diabetes mellitus without complications: Secondary | ICD-10-CM | POA: Insufficient documentation

## 2015-11-11 DIAGNOSIS — I10 Essential (primary) hypertension: Secondary | ICD-10-CM | POA: Insufficient documentation

## 2015-11-11 DIAGNOSIS — R0602 Shortness of breath: Secondary | ICD-10-CM | POA: Diagnosis not present

## 2015-11-11 DIAGNOSIS — Z79899 Other long term (current) drug therapy: Secondary | ICD-10-CM | POA: Insufficient documentation

## 2015-11-11 LAB — BASIC METABOLIC PANEL
Anion gap: 9 (ref 5–15)
BUN: 21 mg/dL — ABNORMAL HIGH (ref 6–20)
CO2: 23 mmol/L (ref 22–32)
Calcium: 9.1 mg/dL (ref 8.9–10.3)
Chloride: 105 mmol/L (ref 101–111)
Creatinine, Ser: 1.35 mg/dL — ABNORMAL HIGH (ref 0.61–1.24)
GFR calc Af Amer: >60 mL/min (ref >60)
GFR calc non Af Amer: 58 mL/min — ABNORMAL LOW (ref >60)
Glucose, Bld: 318 mg/dL — ABNORMAL HIGH (ref 65–99)
Potassium: 4.1 mmol/L (ref 3.5–5.1)
Sodium: 137 mmol/L (ref 135–145)

## 2015-11-11 LAB — CBC
HCT: 36.6 % — ABNORMAL LOW (ref 39.0–52.0)
Hemoglobin: 12.3 g/dL — ABNORMAL LOW (ref 13.0–17.0)
MCH: 26.5 pg (ref 26.0–34.0)
MCHC: 33.6 g/dL (ref 30.0–36.0)
MCV: 78.9 fL (ref 78.0–100.0)
Platelets: 183 10*3/uL (ref 150–400)
RBC: 4.64 MIL/uL (ref 4.22–5.81)
RDW: 13.7 % (ref 11.5–15.5)
WBC: 4.4 10*3/uL (ref 4.0–10.5)

## 2015-11-11 LAB — I-STAT TROPONIN, ED
Troponin i, poc: 0.01 ng/mL (ref 0.00–0.08)
Troponin i, poc: 0 ng/mL (ref 0.00–0.08)

## 2015-11-11 LAB — D-DIMER, QUANTITATIVE: D-Dimer, Quant: 0.29 ug/mL-FEU (ref 0.00–0.50)

## 2015-11-11 MED ORDER — NITROGLYCERIN 0.4 MG SL SUBL
0.4000 mg | SUBLINGUAL_TABLET | SUBLINGUAL | Status: AC | PRN
  Administered 2015-11-11 (×3): 0.4 mg via SUBLINGUAL
  Filled 2015-11-11 (×2): qty 1

## 2015-11-11 MED ORDER — GI COCKTAIL ~~LOC~~
30.0000 mL | Freq: Once | ORAL | Status: AC
  Administered 2015-11-11: 30 mL via ORAL
  Filled 2015-11-11: qty 30

## 2015-11-11 MED ORDER — SODIUM CHLORIDE 0.9 % IV BOLUS (SEPSIS)
1000.0000 mL | Freq: Once | INTRAVENOUS | Status: AC
  Administered 2015-11-11: 1000 mL via INTRAVENOUS

## 2015-11-11 MED ORDER — ASPIRIN 81 MG PO CHEW
324.0000 mg | CHEWABLE_TABLET | Freq: Once | ORAL | Status: AC
  Administered 2015-11-11: 324 mg via ORAL
  Filled 2015-11-11: qty 4

## 2015-11-11 NOTE — ED Triage Notes (Signed)
Per EMS, pt complains of shortness of breath and chest pain since walking to the bus stop today. Pt states he has not taken any of his regular medications, except metformin, for the past 2 months. Pt's CBG 255.

## 2015-11-11 NOTE — Discharge Instructions (Signed)
Try zantac 150mg twice a day.  ° ° °

## 2015-11-11 NOTE — ED Provider Notes (Signed)
[facility] DEPT Provider Note   CSN: [id number] Arrival date & time: 11/11/15  1401     History   Chief Complaint Chief Complaint  Patient presents with  . Shortness of Breath  . Chest Pain    HPI [name] is a 55 y.o. male.  55 yo M with a chief complaints of chest pain. This occurred while the patient was walking to the bus. Describes it as left-sided burning. The going on for about an hour and is not improved. He has not tried anything for this. Has some shortness of breath associated with it. Denies diaphoresis nausea or vomiting. Denies lower extremity edema. Patient was recently incarcerated and was just released. He has not been able to get his home diabetes medicines. Denies history of PE or DVT. Denies prolonged travel recent surgery cancer or hemoptysis.   The history is provided by the patient.  Shortness of Breath  Associated symptoms include chest pain. Pertinent negatives include no fever, no headaches, no vomiting, no abdominal pain and no rash.  Chest Pain   This is a new problem. The current episode started less than 1 hour ago. The problem occurs constantly. The problem has not changed since onset.The pain is present in the lateral region. The pain is at a severity of 7/10. The pain is moderate. The quality of the pain is described as exertional and burning. The pain does not radiate. Duration of episode(s) is 2 days. Associated symptoms include shortness of breath. Pertinent negatives include no abdominal pain, no fever, no headaches, no palpitations and no vomiting. He has tried nothing for the symptoms. The treatment provided no relief. There are no known risk factors.    Past Medical History:  Diagnosis Date  . Bipolar affective ([place])   . Depression   . Diabetes mellitus    uncontrolled   . Hypertension     Patient Active Problem List   Diagnosis Date Noted  . Cocaine use disorder, moderate, dependence ([place]) 01/04/2015  . Severe recurrent major  depression with psychotic features ([place]) 01/04/2015  . Depression, major, recurrent, severe with psychosis ([place]) 08/29/2013  . Suicidal ideations 08/28/2013  . Chest pain 06/30/2013  . HTN (hypertension) 06/30/2013  . Uncontrolled diabetes mellitus ([place]) 06/30/2013  . Bipolar affect, depressed ([place]) 05/13/2011    Past Surgical History:  Procedure Laterality Date  . KNEE SURGERY     b/l knees; pending total knee        Home Medications    Prior to Admission medications   Medication Sig Start Date End Date Taking? Authorizing Provider  metFORMIN (GLUCOPHAGE) 1000 MG tablet Take 1 tablet (1,000 mg total) by mouth 2 (two) times daily with a meal. For diabetes control 01/09/15  Yes Jolanta B Pucilowska, MD  buPROPion (WELLBUTRIN XL) 150 MG 24 hr tablet Take 1 tablet (150 mg total) by mouth daily with breakfast. Patient not taking: Reported on 11/11/2015 01/08/15   [name], MD  insulin aspart protamine- aspart (NOVOLOG MIX 70/30) (70-30) 100 UNIT/ML injection Inject 0.35 mLs (35 Units total) into the skin daily with breakfast. Patient not taking: Reported on 11/11/2015 01/08/15   [name], MD  insulin aspart protamine- aspart (NOVOLOG MIX 70/30) (70-30) 100 UNIT/ML injection Inject 0.4 mLs (40 Units total) into the skin daily with supper. Patient not taking: Reported on 11/11/2015 01/08/15   [name], MD  LEVEMIR FLEXTOUCH 100 UNIT/ML Pen Inject 20 Units into the skin at bedtime. TO [place]  WITH DOSING Patient not taking: Reported on 11/11/2015 01/09/15   [name], MD  lisinopril (PRINIVIL,ZESTRIL) 20 MG tablet Take 1 tablet (20 mg total) by mouth daily. For hypertension Patient not taking: Reported on 11/11/2015 01/09/15   [name], MD  lisinopril (PRINIVIL,ZESTRIL) 20 MG tablet Take 1 tablet (20 mg total) by mouth daily with breakfast. Patient not taking: Reported on 11/11/2015 01/09/15   [name], MD    risperiDONE (RISPERDAL) 3 MG tablet Take 1 tablet (3 mg total) by mouth at bedtime. Patient not taking: Reported on 11/11/2015 01/08/15   [name], MD  zolpidem (AMBIEN) 10 MG tablet Take 1 tablet (10 mg total) by mouth at bedtime. Patient not taking: Reported on 11/11/2015 01/08/15   [name], MD    Family History Family History  Problem Relation Age of Onset  . Diabetes    . Cancer Mother     uterus; deceased   . Hypertension    . Heart attack Father     alive   . Stroke Sister     12/2012 age 42 y.o    Social History Social History  Substance Use Topics  . Smoking status: Former Research scientist (life sciences)  . Smokeless tobacco: Never Used  . Alcohol use No     Allergies   Other and Corn oil   Review of Systems Review of Systems  Constitutional: Negative for chills and fever.  HENT: Negative for congestion and facial swelling.   Eyes: Negative for discharge and visual disturbance.  Respiratory: Positive for shortness of breath.   Cardiovascular: Positive for chest pain. Negative for palpitations.  Gastrointestinal: Negative for abdominal pain, diarrhea and vomiting.  Musculoskeletal: Negative for arthralgias and myalgias.  Skin: Negative for color change and rash.  Neurological: Negative for tremors, syncope and headaches.  Psychiatric/Behavioral: Negative for confusion and dysphoric mood.     Physical Exam Updated Vital Signs BP 123/86 (BP Location: Right Arm)   Pulse 85   Temp 98.3 F (36.8 C) (Oral)   Resp 18   SpO2 98%   Physical Exam  Constitutional: He is oriented to person, place, and time. He appears well-developed and well-nourished.  HENT:  Head: Normocephalic and atraumatic.  Eyes: EOM are normal. Pupils are equal, round, and reactive to light.  Neck: Normal range of motion. Neck supple. No JVD present.  Cardiovascular: Normal rate and regular rhythm.  Exam reveals no gallop and no friction rub.   No murmur heard. Pulmonary/Chest: No  respiratory distress. He has no wheezes.  Abdominal: He exhibits no distension. There is no rebound and no guarding.  Musculoskeletal: Normal range of motion.  Neurological: He is alert and oriented to person, place, and time.  Skin: No rash noted. No pallor.  Psychiatric: He has a normal mood and affect. His behavior is normal.  Nursing note and vitals reviewed.    ED Treatments / Results  Labs (all labs ordered are listed, but only abnormal results are displayed) Labs Reviewed  CBC - Abnormal; Notable for the following:       Result Value   Hemoglobin 12.3 (*)    HCT 36.6 (*)    All other components within normal limits  BASIC METABOLIC PANEL - Abnormal; Notable for the following:    Glucose, Bld 318 (*)    BUN 21 (*)    Creatinine, Ser 1.35 (*)    GFR calc non Af Amer 58 (*)    All other components within normal limits  D-DIMER, QUANTITATIVE (NOT AT [facility])  I-STAT TROPOININ, ED  I-STAT TROPOININ, ED    EKG  EKG Interpretation  Date/Time:  Saturday November 11 2015 14:23:34 EDT Ventricular Rate:  93 PR Interval:    QRS Duration: 76 QT Interval:  366 QTC Calculation: 456 R Axis:   59 Text Interpretation:  Sinus rhythm Probable left atrial enlargement Nonspecific T abnormalities, lateral leads No significant change since last tracing Confirmed by Ermin Parisien MD, DANIEL [phone]) on 11/11/2015 3:28:52 PM       Radiology Dg Chest 2 View  Result Date: 11/11/2015 CLINICAL DATA:  Mid chest pain. EXAM: CHEST  2 VIEW COMPARISON:  03/30/2015 FINDINGS: The heart size and mediastinal contours are within normal limits. There is no evidence of pulmonary edema, consolidation, pneumothorax, nodule or pleural fluid. The visualized skeletal structures are unremarkable. IMPRESSION: No active cardiopulmonary disease. Electronically Signed   By: Aletta Edouard M.D.   On: 11/11/2015 15:04    Procedures Procedures (including critical care time)  Medications Ordered in ED Medications    aspirin chewable tablet 324 mg (324 mg Oral Given 11/11/15 1447)  nitroGLYCERIN (NITROSTAT) SL tablet 0.4 mg (0.4 mg Sublingual Given 11/11/15 1746)  gi cocktail (Maalox,Lidocaine,Donnatal) (30 mLs Oral Given 11/11/15 1447)  sodium chloride 0.9 % bolus 1,000 mL (0 mLs Intravenous Stopped 11/11/15 1911)     Initial Impression / Assessment and Plan / ED Course  I have reviewed the triage vital signs and the nursing notes.  Pertinent labs & imaging results that were available during my care of the patient were reviewed by me and considered in my medical decision making (see chart for details).  Clinical Course    55 yo M With a chief complaint of chest pain and shortness of breath. Sounds atypical from cardiac disease however patient is a poorly controlled diabetic. Will obtain a delta troponin. D-dimer.  Delta trop negative.   9:33 PM:  I have discussed the diagnosis/risks/treatment options with the patient and family and believe the pt to be eligible for discharge home to follow-up with PCP. We also discussed returning to the ED immediately if new or worsening sx occur. We discussed the sx which are most concerning (e.g., sudden worsening pain, fever, inability to tolerate by mouth) that necessitate immediate return. Medications administered to the patient during their visit and any new prescriptions provided to the patient are listed below.  Medications given during this visit Medications  aspirin chewable tablet 324 mg (324 mg Oral Given 11/11/15 1447)  nitroGLYCERIN (NITROSTAT) SL tablet 0.4 mg (0.4 mg Sublingual Given 11/11/15 1746)  gi cocktail (Maalox,Lidocaine,Donnatal) (30 mLs Oral Given 11/11/15 1447)  sodium chloride 0.9 % bolus 1,000 mL (0 mLs Intravenous Stopped 11/11/15 1911)     The patient appears reasonably screen and/or stabilized for discharge and I doubt any other medical condition or other [facility] requiring further screening, evaluation, or treatment in the ED at this time  prior to discharge.    Final Clinical Impressions(s) / ED Diagnoses   Final diagnoses:  Shortness of breath  Atypical chest pain    New Prescriptions Discharge Medication List as of 11/11/2015  7:19 PM       Deno Etienne, DO 11/11/15 2133

## 2016-07-11 ENCOUNTER — Emergency Department (HOSPITAL_COMMUNITY)
Admission: EM | Admit: 2016-07-11 | Discharge: 2016-07-12 | Disposition: A | Discharge status: Home / Self Care | Payer: Medicare Other | Attending: Emergency Medicine | Admitting: Emergency Medicine

## 2016-07-11 ENCOUNTER — Encounter (HOSPITAL_COMMUNITY): Payer: Self-pay | Admitting: Emergency Medicine

## 2016-07-11 DIAGNOSIS — M545 Low back pain, unspecified: Secondary | ICD-10-CM

## 2016-07-11 DIAGNOSIS — I10 Essential (primary) hypertension: Secondary | ICD-10-CM | POA: Diagnosis not present

## 2016-07-11 DIAGNOSIS — E119 Type 2 diabetes mellitus without complications: Secondary | ICD-10-CM | POA: Diagnosis not present

## 2016-07-11 DIAGNOSIS — Z794 Long term (current) use of insulin: Secondary | ICD-10-CM | POA: Insufficient documentation

## 2016-07-11 DIAGNOSIS — Z79899 Other long term (current) drug therapy: Secondary | ICD-10-CM | POA: Diagnosis not present

## 2016-07-11 DIAGNOSIS — G8929 Other chronic pain: Secondary | ICD-10-CM

## 2016-07-11 DIAGNOSIS — Z87891 Personal history of nicotine dependence: Secondary | ICD-10-CM | POA: Insufficient documentation

## 2016-07-11 NOTE — ED Triage Notes (Signed)
Pt presents to ED for assessment of right sided lower back pain, chronic in nature, but worsening tonight.  A large knot is noted to patient's right lower back.

## 2016-07-11 NOTE — ED Notes (Signed)
Checked CBG 465, RN Katy informed

## 2016-07-12 ENCOUNTER — Emergency Department (HOSPITAL_COMMUNITY): Payer: Medicare Other

## 2016-07-12 ENCOUNTER — Encounter (HOSPITAL_COMMUNITY): Payer: Self-pay | Admitting: Emergency Medicine

## 2016-07-12 DIAGNOSIS — M545 Low back pain: Secondary | ICD-10-CM | POA: Diagnosis not present

## 2016-07-12 LAB — CBG MONITORING, ED
Glucose-Capillary: 465 mg/dL — ABNORMAL HIGH (ref 65–99)
Glucose-Capillary: 375 mg/dL — ABNORMAL HIGH (ref 65–99)

## 2016-07-12 MED ORDER — METHYLPREDNISOLONE 4 MG PO TBPK: ORAL_TABLET | ORAL | 0 refills | Status: DC

## 2016-07-12 NOTE — ED Notes (Signed)
Patient transported to X-ray 

## 2016-07-12 NOTE — ED Provider Notes (Signed)
[facility] DEPT Provider Note   CSN: [id number] Arrival date & time: 07/11/16  2131     History   Chief Complaint Chief Complaint  Patient presents with  . Back Pain    HPI [name] is a 56 y.o. male.  HPI  Patient presents with R lower back pain that has been chronic in nature for past 7-8 months but has worsened in the past day. Reports constant pain that radiates from R side to L side in lower back. Similar history of pain in the past. He states he fell on back and has been experiencing pain since then. He reports history of back pain and large "knot" on R lower back that has been followed by orthopedist/PCP in the past when he lived in [place]. He states unsure of what knot is, but reports steroid injections in the past that have helped. Denies change in size of knot recently. States he has had to be hospitalized in the past for pain relating to his back, but unsure why or what happened during hospitalizations, apart from the steroid injections. Has tried Aleve with no relief.  Pain worse with ambulation although he is able to walk. Denies bowel or bladder incontinence, numbness, weakness, inability to walk, syncope, fever, lightheadedness.  Past Medical History:  Diagnosis Date  . Bipolar affective ([place])   . Depression   . Diabetes mellitus    uncontrolled   . Hypertension     Patient Active Problem List   Diagnosis Date Noted  . Cocaine use disorder, moderate, dependence ([place]) 01/04/2015  . Severe recurrent major depression with psychotic features ([place]) 01/04/2015  . Depression, major, recurrent, severe with psychosis ([place]) 08/29/2013  . Suicidal ideations 08/28/2013  . Chest pain 06/30/2013  . HTN (hypertension) 06/30/2013  . Uncontrolled diabetes mellitus ([place]) 06/30/2013  . Bipolar affect, depressed ([place]) 05/13/2011    Past Surgical History:  Procedure Laterality Date  . KNEE SURGERY     b/l knees; pending total knee        Home Medications     Prior to Admission medications   Medication Sig Start Date End Date Taking? Authorizing Provider  buPROPion (WELLBUTRIN XL) 150 MG 24 hr tablet Take 1 tablet (150 mg total) by mouth daily with breakfast. Patient not taking: Reported on 11/11/2015 01/08/15   Pucilowska, Herma Ard B, MD  insulin aspart protamine- aspart (NOVOLOG MIX 70/30) (70-30) 100 UNIT/ML injection Inject 0.35 mLs (35 Units total) into the skin daily with breakfast. Patient not taking: Reported on 11/11/2015 01/08/15   Pucilowska, Herma Ard B, MD  insulin aspart protamine- aspart (NOVOLOG MIX 70/30) (70-30) 100 UNIT/ML injection Inject 0.4 mLs (40 Units total) into the skin daily with supper. Patient not taking: Reported on 11/11/2015 01/08/15   Pucilowska, Wardell Honour, MD  LEVEMIR FLEXTOUCH 100 UNIT/ML Pen Inject 20 Units into the skin at bedtime. TO [place] WITH DOSING Patient not taking: Reported on 11/11/2015 01/09/15   Pucilowska, Herma Ard B, MD  lisinopril (PRINIVIL,ZESTRIL) 20 MG tablet Take 1 tablet (20 mg total) by mouth daily. For hypertension Patient not taking: Reported on 11/11/2015 01/09/15   Pucilowska, Herma Ard B, MD  lisinopril (PRINIVIL,ZESTRIL) 20 MG tablet Take 1 tablet (20 mg total) by mouth daily with breakfast. Patient not taking: Reported on 11/11/2015 01/09/15   Pucilowska, Wardell Honour, MD  metFORMIN (GLUCOPHAGE) 1000 MG tablet Take 1 tablet (1,000 mg total) by mouth 2 (two) times daily with a meal. For diabetes control 01/09/15   Pucilowska, Jolanta B,  MD  methylPREDNISolone (MEDROL DOSEPAK) 4 MG TBPK tablet Taper over 6 days. 07/12/16   Leitha Hyppolite, PA-C  risperiDONE (RISPERDAL) 3 MG tablet Take 1 tablet (3 mg total) by mouth at bedtime. Patient not taking: Reported on 11/11/2015 01/08/15   Pucilowska, Herma Ard B, MD  zolpidem (AMBIEN) 10 MG tablet Take 1 tablet (10 mg total) by mouth at bedtime. Patient not taking: Reported on 11/11/2015 01/08/15   [name], MD    Family  History Family History  Problem Relation Age of Onset  . Diabetes Unknown   . Cancer Mother        uterus; deceased   . Hypertension Unknown   . Heart attack Father        alive   . Stroke Sister        12/2012 age 71 y.o    Social History Social History  Substance Use Topics  . Smoking status: Former Research scientist (life sciences)  . Smokeless tobacco: Never Used  . Alcohol use No     Allergies   Other and Corn oil   Review of Systems Review of Systems  Constitutional: Negative for chills and fever.  Respiratory: Negative for shortness of breath.   Cardiovascular: Positive for leg swelling.  Gastrointestinal: Negative for nausea and vomiting.  Genitourinary: Negative for decreased urine volume.  Musculoskeletal: Positive for back pain. Negative for arthralgias, joint swelling and myalgias.  Skin: Negative for color change and wound.  Neurological: Negative for syncope, weakness, light-headedness and numbness.     Physical Exam Updated Vital Signs BP (!) 146/79 (BP Location: Right Arm)   Pulse 79   Temp 97.4 F (36.3 C) (Oral)   Resp 16   SpO2 100%   Physical Exam  Constitutional: He appears well-developed and well-nourished. No distress.  HENT:  Head: Normocephalic and atraumatic.  Eyes: Conjunctivae and EOM are normal. No scleral icterus.  Neck: Normal range of motion.  Pulmonary/Chest: Effort normal. No respiratory distress.  Musculoskeletal: He exhibits edema and tenderness (R lower back and L lower back).  There is a 5cm lipoma noted on the R lower back with associated TTP. No areas of fluctuance that would suggest abscess. TTP of L lower back. Full ROM and good strength of BLE. No signs of neurovascular compromise. No saddle anesthesia noted.   Neurological: He is alert. No sensory deficit. He exhibits normal muscle tone. Coordination normal.  Skin: No rash noted. He is not diaphoretic.  Psychiatric: He has a normal mood and affect.  Nursing note and vitals  reviewed.    ED Treatments / Results  Labs (all labs ordered are listed, but only abnormal results are displayed) Labs Reviewed  CBG MONITORING, ED - Abnormal; Notable for the following:       Result Value   Glucose-Capillary 375 (*)    All other components within normal limits  CBG MONITORING, ED - Abnormal; Notable for the following:    Glucose-Capillary 465 (*)    All other components within normal limits    EKG  EKG Interpretation None       Radiology Dg Lumbar Spine Complete  Result Date: 07/12/2016 CLINICAL DATA:  Lumbosacral back pain. Knot on right side. Worsening of chronic right-sided back pain. EXAM: LUMBAR SPINE - COMPLETE 4+ VIEW COMPARISON:  CT abdomen/ pelvis reformats 03-30-15 FINDINGS: Diminutive ribs at L1. Four non-rib-bearing lumbar vertebra. The lower most non-rib-bearing lumbar vertebra will be labeled L5. Vertebral body heights are maintained. No acute fracture. Disc space narrowing at L3-L4 and to  a lesser extent L4-L5 with endplate spurring, similar to prior CT. There is scattered facet arthropathy most prominent in the lower lumbar spine. Posterior elements are intact. No focal soft tissue abnormality to explain clinical "knot". IMPRESSION: Degenerative disc disease and facet arthropathy, grossly stable. No acute abnormality. Electronically Signed   By: Jeb Levering M.D.   On: 07/12/2016 01:37   Dg Sacrum/coccyx  Result Date: 07/12/2016 CLINICAL DATA:  Lumbosacral back pain. Worsening of chronic right-sided low back pain. EXAM: SACRUM AND COCCYX - 2+ VIEW COMPARISON:  None. FINDINGS: There is no evidence of fracture or other focal bone lesions. Cortical margins of the sacrum are intact. The sacral ala are maintained. Trace degenerative change of the sacroiliac joints. IMPRESSION: Minimal degenerative change of both sacroiliac joints. No acute sacrococcygeal abnormality. Electronically Signed   By: Jeb Levering M.D.   On: 07/12/2016 01:38     Procedures Procedures (including critical care time)  Medications Ordered in ED Medications - No data to display   Initial Impression / Assessment and Plan / ED Course  I have reviewed the triage vital signs and the nursing notes.  Pertinent labs & imaging results that were available during my care of the patient were reviewed by me and considered in my medical decision making (see chart for details).    Patient's history and symptoms warrant concern of acute fracture of dislocation vs. Exacerbation of chronic back pain due to fall. Xrays of L spine and sacrum were obtained which were negative for fracture or dislocation. Both did show degenerative changes present that appear stable. No concern for acute spinal injury due to ability to walk, no numbness and no history of bladder or bowel incontinence. Patient states knot on back has not changed in size since previous and no concern for abscess. Symptoms could be due to acute inflammation of muscles due to movement and fall. Given Medrol dosepak and warned patient that blood sugars will run high when on this, and that he should monitor accordingly. CBG improved here in the ED since arrival in triage. Reports compliance with insulin. Patient denies other complaints at this time. Encouraged patient to obtain a PCP for further evaluation of his chronic medical issues. He states he will do so since he just recently moved from [place]. Strict return precautions given.  Patient discussed with and seen by Dr. Wyvonnia Dusky.  Final Clinical Impressions(s) / ED Diagnoses   Final diagnoses:  Chronic bilateral low back pain without sciatica    New Prescriptions Discharge Medication List as of 07/12/2016  1:54 AM    START taking these medications   Details  methylPREDNISolone (MEDROL DOSEPAK) 4 MG TBPK tablet Taper over 6 days., Print         Delia Heady, PA-C 07/12/16 1356    Ezequiel Essex, MD 07/13/16 [phone]

## 2016-07-12 NOTE — ED Notes (Signed)
Water provided per PA

## 2016-07-12 NOTE — Discharge Instructions (Signed)
Take steroid pack as directed. Be aware that your blood sugars will run high while you are on the steroids. Please monitor accordingly. Continue taking Tylenol as needed for pain. Return to ED for worsening pain, trouble walking, numbness, weakness, additional injury, loss of bladder function.

## 2016-11-04 ENCOUNTER — Encounter: Payer: Self-pay | Admitting: Pediatric Intensive Care

## 2016-11-09 ENCOUNTER — Encounter (HOSPITAL_COMMUNITY): Payer: Self-pay | Admitting: *Deleted

## 2016-11-09 ENCOUNTER — Emergency Department (HOSPITAL_COMMUNITY)
Admission: EM | Admit: 2016-11-09 | Discharge: 2016-11-09 | Disposition: A | Discharge status: Home / Self Care | Payer: Medicare Other | Attending: Emergency Medicine | Admitting: Emergency Medicine

## 2016-11-09 ENCOUNTER — Emergency Department (HOSPITAL_COMMUNITY): Payer: Medicare Other

## 2016-11-09 DIAGNOSIS — Z87891 Personal history of nicotine dependence: Secondary | ICD-10-CM | POA: Insufficient documentation

## 2016-11-09 DIAGNOSIS — Y929 Unspecified place or not applicable: Secondary | ICD-10-CM | POA: Insufficient documentation

## 2016-11-09 DIAGNOSIS — Z79899 Other long term (current) drug therapy: Secondary | ICD-10-CM | POA: Insufficient documentation

## 2016-11-09 DIAGNOSIS — Y998 Other external cause status: Secondary | ICD-10-CM | POA: Insufficient documentation

## 2016-11-09 DIAGNOSIS — Z794 Long term (current) use of insulin: Secondary | ICD-10-CM | POA: Diagnosis not present

## 2016-11-09 DIAGNOSIS — M25551 Pain in right hip: Secondary | ICD-10-CM

## 2016-11-09 DIAGNOSIS — E119 Type 2 diabetes mellitus without complications: Secondary | ICD-10-CM | POA: Insufficient documentation

## 2016-11-09 DIAGNOSIS — M25561 Pain in right knee: Secondary | ICD-10-CM | POA: Diagnosis not present

## 2016-11-09 DIAGNOSIS — I10 Essential (primary) hypertension: Secondary | ICD-10-CM | POA: Diagnosis not present

## 2016-11-09 DIAGNOSIS — Y9301 Activity, walking, marching and hiking: Secondary | ICD-10-CM | POA: Insufficient documentation

## 2016-11-09 DIAGNOSIS — W19XXXA Unspecified fall, initial encounter: Secondary | ICD-10-CM

## 2016-11-09 DIAGNOSIS — W010XXA Fall on same level from slipping, tripping and stumbling without subsequent striking against object, initial encounter: Secondary | ICD-10-CM | POA: Insufficient documentation

## 2016-11-09 NOTE — ED Notes (Signed)
Patient transported to X-ray 

## 2016-11-09 NOTE — ED Triage Notes (Signed)
To ED via pov for eval of right leg (hip and knee) pain since falling while crossing the road on Thursday. Pain has become worse so PMD advised to come to ED. Able to ambulate but with pain.

## 2016-11-09 NOTE — ED Provider Notes (Signed)
[facility] DEPT Provider Note   CSN: [id number] Arrival date & time: 11/09/16  1660     History   Chief Complaint Chief Complaint  Patient presents with  . Knee Pain  . Hip Pain    HPI [name] is a 56 y.o. male who presents with right hip and knee pain. PMH of bipolar d/o, chronic knee pain, HTN, insulin dependent Type 2 DM. He states that he was walking outside and was stepping up on a median and his right knee wobbled and gave out and he fell on his right side. He was able to get up and ambulate but has had persistent pain. Yesterday he was in severe pain and called his doctor's office. They advised him to come to the ED. He was unable to come yesterday because he was in too much pain so he came today. He feels like when he walks he is leaning to one side. No weakness or numbness. He has had multiple arthroscopic surgeries on his knees due to prior injuries and states that he will likely need a knee replacement in the future.  HPI  Past Medical History:  Diagnosis Date  . Bipolar affective ([place])   . Depression   . Diabetes mellitus    uncontrolled   . Hypertension     Patient Active Problem List   Diagnosis Date Noted  . Cocaine use disorder, moderate, dependence ([place]) 01/04/2015  . Severe recurrent major depression with psychotic features ([place]) 01/04/2015  . Depression, major, recurrent, severe with psychosis ([place]) 08/29/2013  . Suicidal ideations 08/28/2013  . Chest pain 06/30/2013  . HTN (hypertension) 06/30/2013  . Uncontrolled diabetes mellitus ([place]) 06/30/2013  . Bipolar affect, depressed ([place]) 05/13/2011    Past Surgical History:  Procedure Laterality Date  . KNEE SURGERY     b/l knees; pending total knee        Home Medications    Prior to Admission medications   Medication Sig Start Date End Date Taking? Authorizing Provider  buPROPion (WELLBUTRIN XL) 150 MG 24 hr tablet Take 1 tablet (150 mg total) by mouth daily with breakfast. Patient  not taking: Reported on 11/11/2015 01/08/15   Pucilowska, Herma Ard B, MD  insulin aspart protamine- aspart (NOVOLOG MIX 70/30) (70-30) 100 UNIT/ML injection Inject 0.35 mLs (35 Units total) into the skin daily with breakfast. Patient not taking: Reported on 11/11/2015 01/08/15   Pucilowska, Herma Ard B, MD  insulin aspart protamine- aspart (NOVOLOG MIX 70/30) (70-30) 100 UNIT/ML injection Inject 0.4 mLs (40 Units total) into the skin daily with supper. Patient not taking: Reported on 11/11/2015 01/08/15   Pucilowska, Wardell Honour, MD  LEVEMIR FLEXTOUCH 100 UNIT/ML Pen Inject 20 Units into the skin at bedtime. TO [place] WITH DOSING Patient not taking: Reported on 11/11/2015 01/09/15   Pucilowska, Herma Ard B, MD  lisinopril (PRINIVIL,ZESTRIL) 20 MG tablet Take 1 tablet (20 mg total) by mouth daily. For hypertension Patient not taking: Reported on 11/11/2015 01/09/15   Pucilowska, Herma Ard B, MD  lisinopril (PRINIVIL,ZESTRIL) 20 MG tablet Take 1 tablet (20 mg total) by mouth daily with breakfast. Patient not taking: Reported on 11/11/2015 01/09/15   Pucilowska, Herma Ard B, MD  metFORMIN (GLUCOPHAGE) 1000 MG tablet Take 1 tablet (1,000 mg total) by mouth 2 (two) times daily with a meal. For diabetes control 01/09/15   Pucilowska, Jolanta B, MD  methylPREDNISolone (MEDROL DOSEPAK) 4 MG TBPK tablet Taper over 6 days. 07/12/16   Khatri, Hina, PA-C  risperiDONE (RISPERDAL) 3 MG  tablet Take 1 tablet (3 mg total) by mouth at bedtime. Patient not taking: Reported on 11/11/2015 01/08/15   Pucilowska, Herma Ard B, MD  zolpidem (AMBIEN) 10 MG tablet Take 1 tablet (10 mg total) by mouth at bedtime. Patient not taking: Reported on 11/11/2015 01/08/15   [name], MD    Family History Family History  Problem Relation Age of Onset  . Diabetes Unknown   . Cancer Mother        uterus; deceased   . Hypertension Unknown   . Heart attack Father        alive   . Stroke Sister        12/2012 age 23 y.o      Social History Social History  Substance Use Topics  . Smoking status: Former Research scientist (life sciences)  . Smokeless tobacco: Never Used  . Alcohol use No     Allergies   Other and Corn oil   Review of Systems Review of Systems  Musculoskeletal: Positive for arthralgias, gait problem and myalgias. Negative for back pain and joint swelling.  Skin: Negative for wound.  Neurological: Negative for weakness and numbness.     Physical Exam Updated Vital Signs BP (!) 146/101 (BP Location: Right Arm)   Pulse 82   Temp 97.8 F (36.6 C) (Oral)   Resp 17   SpO2 100%   Physical Exam  Constitutional: He is oriented to person, place, and time. He appears well-developed and well-nourished. No distress.  HENT:  Head: Normocephalic and atraumatic.  Eyes: Pupils are equal, round, and reactive to light. Conjunctivae are normal. Right eye exhibits no discharge. Left eye exhibits no discharge. No scleral icterus.  Neck: Normal range of motion.  Cardiovascular: Normal rate.   Pulmonary/Chest: Effort normal. No respiratory distress.  Abdominal: He exhibits no distension.  Musculoskeletal:  Right hip: No obvious swelling, deformity, or warmth. Tenderness to palpation of lateral aspect of hip. Ambulatory with a limp  Right knee: No obvious swelling, deformity, or warmth. No tenderness to palpation. Pain is elicited with ROM.    Neurological: He is alert and oriented to person, place, and time.  Skin: Skin is warm and dry.  Psychiatric: He has a normal mood and affect. His behavior is normal.  Nursing note and vitals reviewed.    ED Treatments / Results  Labs (all labs ordered are listed, but only abnormal results are displayed) Labs Reviewed - No data to display  EKG  EKG Interpretation None       Radiology Dg Knee Complete 4 Views Right  Result Date: 11/09/2016 CLINICAL DATA:  Generalized right hip and knee pain after falling. EXAM: RIGHT KNEE - COMPLETE 4+ VIEW COMPARISON:  01/29/2010  FINDINGS: No acute fracture or dislocation. 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 There is severe osteophytosis involving the cranial aspect of the patellofemoral joint. Suspected chondrocalcinosis. No joint effusion. Suspected dermal calcifications within the superior aspect of the calf, unchanged compared to the 01/2010 exam. IMPRESSION: 1. No definite acute findings. 2. Severe tricompartmental degenerative change of the knee, progressed compared to the 01/2010 examination Electronically Signed   By: Sandi Mariscal M.D.   On: 11/09/2016 11:35   Dg Hip Unilat With Pelvis 2-3 Views Right  Result Date: 11/09/2016 CLINICAL DATA:  Right hip pain after a fall on Thursday. EXAM: DG HIP (WITH OR WITHOUT PELVIS) 2-3V RIGHT COMPARISON:  Multiple exams, including 07/12/2016 and CT scan from 03/30/2015 FINDINGS: Mild  spurring of both acetabula and of the femoral heads, right greater than left. No visible fracture or acute bony findings. IMPRESSION: 1. Mild degenerative findings the right hip without appreciable fracture. If pain persists despite conservative therapy, MRI may be warranted for further characterization. Electronically Signed   By: Van Clines M.D.   On: 11/09/2016 11:52    Procedures Procedures (including critical care time)  Medications Ordered in ED Medications - No data to display   Initial Impression / Assessment and Plan / ED Course  I have reviewed the triage vital signs and the nursing notes.  Pertinent labs & imaging results that were available during my care of the patient were reviewed by me and considered in my medical decision making (see chart for details).  56 year old male with acute on chronic pain s/p mechanical fall two days ago. Xrays show degenerative changes without acute pathology. He is ambulatory with a limp. Advised supportive measures and to take NSAIDs or Tylenol for  pain. Knee sleeve was given. Advised f/u with ortho for persistent symptoms.  Final Clinical Impressions(s) / ED Diagnoses   Final diagnoses:  Acute pain of right knee  Right hip pain  Fall, initial encounter    New Prescriptions New Prescriptions   No medications on file     [name] 11/09/16 [place], Wenda Overland, MD 11/09/16 2137

## 2016-11-09 NOTE — Discharge Instructions (Signed)
Rest - please stay off right leg as much as possible for the next couple days Ice - ice for 20 minutes at a time, several times a day Compression - wear brace to provide support Ibuprofen or Naproxen - take with food.  Follow up with orthopedics

## 2016-11-18 ENCOUNTER — Other Ambulatory Visit: Payer: Self-pay

## 2016-11-18 DIAGNOSIS — I70219 Atherosclerosis of native arteries of extremities with intermittent claudication, unspecified extremity: Secondary | ICD-10-CM

## 2016-11-21 NOTE — Congregational Nurse Program (Signed)
Congregational Nurse Program Note  Date of Encounter: 10/28/2016  Past Medical History: Past Medical History:  Diagnosis Date  . Bipolar affective ([place])   . Depression   . Diabetes mellitus    uncontrolled   . Hypertension     Encounter Details:     CNP Questionnaire - 10/28/16 1628      Patient Demographics   Is this a new or existing patient? New   Patient is considered a/an Not Applicable   Race African-American/Black     Patient Assistance   Location of Patient Assistance Not Applicable   Patient's financial/insurance status Low Income;Medicaid   Patient referred to apply for the following financial assistance Not Applicable   Food insecurities addressed Not Applicable   Transportation assistance No   Assistance securing medications No   Educational health offerings Acute disease     Encounter Details   Primary purpose of visit Acute Illness/Condition Visit;Safety   Was an Emergency Department visit averted? Not Applicable   Does patient have a medical provider? Yes   Patient referred to Establish PCP   Was a mental health screening completed? (GAINS tool) No   Does patient have dental issues? No   Does patient have vision issues? No   Does your patient have an abnormal blood pressure today? No   Since previous encounter, have you referred patient for abnormal blood pressure that resulted in a new diagnosis or medication change? No   Does your patient have an abnormal blood glucose today? No   Since previous encounter, have you referred patient for abnormal blood glucose that resulted in a new diagnosis or medication change? No   Was there a life-saving intervention made? No     States had fallen and right knee is painful and swollen.  Ice applied.  Encouraged client to contact provider if pain did not lesson.

## 2016-11-26 ENCOUNTER — Encounter: Payer: Self-pay | Admitting: Pediatric Intensive Care

## 2016-12-01 NOTE — Congregational Nurse Program (Signed)
Congregational Nurse Program Note  Date of Encounter: 11/04/2016  Past Medical History: Past Medical History:  Diagnosis Date  . Bipolar affective ([place])   . Depression   . Diabetes mellitus    uncontrolled   . Hypertension     Encounter Details:     CNP Questionnaire - 11/04/16 1030      Patient Demographics   Is this a new or existing patient? New   Patient is considered a/an Not Applicable   Race African-American/Black     Patient Assistance   Location of Patient Assistance [facility]   Patient's financial/insurance status Medicaid;Medicare;Low Income   Uninsured Patient ([facility]) No   Patient referred to apply for the following financial assistance Not Applicable   Food insecurities addressed Not Applicable   Transportation assistance No   Assistance securing medications No   Educational health offerings Acute disease     Encounter Details   Primary purpose of visit Acute Illness/Condition Visit   Was an Emergency Department visit averted? Not Applicable   Does patient have a medical provider? Yes   Patient referred to Follow up with established PCP   Was a mental health screening completed? (GAINS tool) No   Does patient have dental issues? No   Does patient have vision issues? No   Does your patient have an abnormal blood pressure today? No   Since previous encounter, have you referred patient for abnormal blood pressure that resulted in a new diagnosis or medication change? No   Does your patient have an abnormal blood glucose today? No   Since previous encounter, have you referred patient for abnormal blood glucose that resulted in a new diagnosis or medication change? No   Was there a life-saving intervention made? No    New client- states he fell at work. He twisted his right knee. Previously had knee replacement that side. Rates pain 8/10. CN encouraged client to call PCP as he continues to have pain.

## 2016-12-04 ENCOUNTER — Other Ambulatory Visit: Payer: Self-pay | Admitting: Orthopedic Surgery

## 2016-12-04 DIAGNOSIS — M549 Dorsalgia, unspecified: Secondary | ICD-10-CM

## 2016-12-10 ENCOUNTER — Encounter: Payer: Self-pay | Admitting: Pediatric Intensive Care

## 2016-12-14 ENCOUNTER — Other Ambulatory Visit: Payer: Self-pay

## 2016-12-17 ENCOUNTER — Encounter (HOSPITAL_COMMUNITY): Payer: Self-pay | Admitting: Emergency Medicine

## 2016-12-17 ENCOUNTER — Emergency Department (HOSPITAL_COMMUNITY)
Admission: EM | Admit: 2016-12-17 | Discharge: 2016-12-17 | Disposition: A | Discharge status: Home / Self Care | Payer: Medicare Other | Attending: Emergency Medicine | Admitting: Emergency Medicine

## 2016-12-17 ENCOUNTER — Inpatient Hospital Stay
Admission: RE | Admit: 2016-12-17 | Discharge: 2016-12-17 | Disposition: A | Discharge status: Home / Self Care | Payer: Self-pay | Source: Ambulatory Visit | Attending: Orthopedic Surgery | Admitting: Orthopedic Surgery

## 2016-12-17 DIAGNOSIS — E1165 Type 2 diabetes mellitus with hyperglycemia: Secondary | ICD-10-CM | POA: Diagnosis not present

## 2016-12-17 DIAGNOSIS — I10 Essential (primary) hypertension: Secondary | ICD-10-CM | POA: Insufficient documentation

## 2016-12-17 DIAGNOSIS — Z87891 Personal history of nicotine dependence: Secondary | ICD-10-CM | POA: Insufficient documentation

## 2016-12-17 DIAGNOSIS — Z7984 Long term (current) use of oral hypoglycemic drugs: Secondary | ICD-10-CM | POA: Diagnosis not present

## 2016-12-17 DIAGNOSIS — Z79899 Other long term (current) drug therapy: Secondary | ICD-10-CM | POA: Insufficient documentation

## 2016-12-17 DIAGNOSIS — Z9114 Patient's other noncompliance with medication regimen: Secondary | ICD-10-CM | POA: Diagnosis not present

## 2016-12-17 DIAGNOSIS — R739 Hyperglycemia, unspecified: Secondary | ICD-10-CM

## 2016-12-17 DIAGNOSIS — R42 Dizziness and giddiness: Secondary | ICD-10-CM | POA: Diagnosis present

## 2016-12-17 LAB — CBG MONITORING, ED
Glucose-Capillary: 442 mg/dL — ABNORMAL HIGH (ref 65–99)
Glucose-Capillary: 217 mg/dL — ABNORMAL HIGH (ref 65–99)

## 2016-12-17 LAB — CBC
HCT: 39.2 % (ref 39.0–52.0)
Hemoglobin: 13 g/dL (ref 13.0–17.0)
MCH: 26 pg (ref 26.0–34.0)
MCHC: 33.2 g/dL (ref 30.0–36.0)
MCV: 78.4 fL (ref 78.0–100.0)
Platelets: 177 10*3/uL (ref 150–400)
RBC: 5 MIL/uL (ref 4.22–5.81)
RDW: 13.3 % (ref 11.5–15.5)
WBC: 3.2 10*3/uL — ABNORMAL LOW (ref 4.0–10.5)

## 2016-12-17 LAB — URINALYSIS, ROUTINE W REFLEX MICROSCOPIC
Bacteria, UA: NONE SEEN
Bilirubin Urine: NEGATIVE
Glucose, UA: >=500 mg/dL — AB
Hgb urine dipstick: NEGATIVE
Ketones, ur: NEGATIVE mg/dL
Leukocytes, UA: NEGATIVE
Nitrite: NEGATIVE
Protein, ur: 100 mg/dL — AB
Specific Gravity, Urine: 1.029 (ref 1.005–1.030)
Squamous Epithelial / LPF: NONE SEEN
pH: 7 (ref 5.0–8.0)

## 2016-12-17 LAB — BASIC METABOLIC PANEL
Anion gap: 10 (ref 5–15)
BUN: 9 mg/dL (ref 6–20)
CO2: 25 mmol/L (ref 22–32)
Calcium: 9.2 mg/dL (ref 8.9–10.3)
Chloride: 95 mmol/L — ABNORMAL LOW (ref 101–111)
Creatinine, Ser: 0.91 mg/dL (ref 0.61–1.24)
GFR calc Af Amer: >60 mL/min (ref >60)
GFR calc non Af Amer: >60 mL/min (ref >60)
Glucose, Bld: 488 mg/dL — ABNORMAL HIGH (ref 65–99)
Potassium: 4.2 mmol/L (ref 3.5–5.1)
Sodium: 130 mmol/L — ABNORMAL LOW (ref 135–145)

## 2016-12-17 MED ORDER — SODIUM CHLORIDE 0.9 % IV BOLUS (SEPSIS)
1000.0000 mL | Freq: Once | INTRAVENOUS | Status: AC
  Administered 2016-12-17: 1000 mL via INTRAVENOUS

## 2016-12-17 MED ORDER — SODIUM CHLORIDE 0.9 % IV SOLN
INTRAVENOUS | Status: DC
  Administered 2016-12-17: 19:00:00 via INTRAVENOUS

## 2016-12-17 MED ORDER — INSULIN ASPART 100 UNIT/ML ~~LOC~~ SOLN
10.0000 [IU] | Freq: Once | SUBCUTANEOUS | Status: AC
  Administered 2016-12-17: 10 [IU] via INTRAVENOUS
  Filled 2016-12-17: qty 1

## 2016-12-17 NOTE — ED Provider Notes (Signed)
[facility] Provider Note   CSN: [id number] Arrival date & time: 12/17/16  1737     History   Chief Complaint Chief Complaint  Patient presents with  . Hyperglycemia    HPI [name] is a 56 y.o. male.  Patient brought in from [facility], initial call to EMS from the air was for low blood sugar.  But when they arrived patient's blood sugar was in the 500 range.  Patient talked about some dizziness and some weakness and feeling as if he was going to pass out.  States that he has been out of his insulin for couple days his medications were supposed to come today but they did not.  Patient arrived here in no distress.      Past Medical History:  Diagnosis Date  . Bipolar affective ([place])   . Depression   . Diabetes mellitus    uncontrolled   . Hypertension     Patient Active Problem List   Diagnosis Date Noted  . Cocaine use disorder, moderate, dependence ([place]) 01/04/2015  . Severe recurrent major depression with psychotic features ([place]) 01/04/2015  . Depression, major, recurrent, severe with psychosis ([place]) 08/29/2013  . Suicidal ideations 08/28/2013  . Chest pain 06/30/2013  . HTN (hypertension) 06/30/2013  . Uncontrolled diabetes mellitus ([place]) 06/30/2013  . Bipolar affect, depressed ([place]) 05/13/2011    Past Surgical History:  Procedure Laterality Date  . KNEE SURGERY     b/l knees; pending total knee        Home Medications    Prior to Admission medications   Medication Sig Start Date End Date Taking? Authorizing Provider  lisinopril (PRINIVIL,ZESTRIL) 20 MG tablet Take 20 mg by mouth daily. 12/12/16  Yes [provider]  metFORMIN (GLUCOPHAGE) 1000 MG tablet Take 1 tablet (1,000 mg total) by mouth 2 (two) times daily with a meal. For diabetes control 01/09/15  Yes Pucilowska, Jolanta B, MD  methylPREDNISolone (MEDROL DOSEPAK) 4 MG TBPK tablet Taper over 6 days. Patient not taking: Reported  on 12/17/2016 07/12/16   Delia Heady, PA-C    Family History Family History  Problem Relation Age of Onset  . Diabetes Unknown   . Cancer Mother        uterus; deceased   . Hypertension Unknown   . Heart attack Father        alive   . Stroke Sister        12/2012 age 2 y.o    Social History Social History  Substance Use Topics  . Smoking status: Former Research scientist (life sciences)  . Smokeless tobacco: Never Used  . Alcohol use No     Allergies   Other   Review of Systems Review of Systems  Constitutional: Negative for fever.  HENT: Negative for congestion.   Respiratory: Negative for shortness of breath.   Cardiovascular: Negative for chest pain.  Gastrointestinal: Negative for abdominal pain.  Endocrine: Positive for polyuria.  Genitourinary: Negative for dysuria.  Musculoskeletal: Negative for myalgias.  Skin: Negative for rash.  Allergic/Immunologic: Positive for immunocompromised state.  Neurological: Negative for headaches.  Hematological: Does not bruise/bleed easily.  Psychiatric/Behavioral: Negative for confusion.     Physical Exam Updated Vital Signs BP (!) 144/99   Pulse 81   Temp 98.1 F (36.7 C) (Oral)   Resp 18   Ht 1.727 m (5\' 8" )   Wt 111.6 kg (246 lb)   SpO2 100%   BMI 37.40 kg/m   Physical Exam  Constitutional: He is oriented to person, place, and time. He appears well-developed and well-nourished. No distress.  HENT:  Head: Normocephalic and atraumatic.  Mouth/Throat: Oropharynx is clear and moist.  Eyes: Pupils are equal, round, and reactive to light. EOM are normal.  Neck: Normal range of motion. Neck supple.  Cardiovascular: Normal rate, regular rhythm and normal heart sounds.   Pulmonary/Chest: Effort normal and breath sounds normal.  Abdominal: Soft. Bowel sounds are normal. He exhibits distension. There is no tenderness.  Musculoskeletal: Normal range of motion. He exhibits no edema.  Neurological: He is alert and oriented to person, place,  and time. No cranial nerve deficit or sensory deficit. He exhibits normal muscle tone. Coordination normal.  Skin: Skin is warm.  Nursing note and vitals reviewed.    ED Treatments / Results  Labs (all labs ordered are listed, but only abnormal results are displayed) Labs Reviewed  BASIC METABOLIC PANEL - Abnormal; Notable for the following:       Result Value   Sodium 130 (*)    Chloride 95 (*)    Glucose, Bld 488 (*)    All other components within normal limits  CBC - Abnormal; Notable for the following:    WBC 3.2 (*)    All other components within normal limits  URINALYSIS, ROUTINE W REFLEX MICROSCOPIC - Abnormal; Notable for the following:    Color, Urine STRAW (*)    Glucose, UA >=500 (*)    Protein, ur 100 (*)    All other components within normal limits  CBG MONITORING, ED - Abnormal; Notable for the following:    Glucose-Capillary 442 (*)    All other components within normal limits  CBG MONITORING, ED - Abnormal; Notable for the following:    Glucose-Capillary 217 (*)    All other components within normal limits    EKG  EKG Interpretation None       Radiology No results found.  Procedures Procedures (including critical care time)  Medications Ordered in ED Medications  0.9 %  sodium chloride infusion ( Intravenous New Bag/Given 12/17/16 1839)  sodium chloride 0.9 % bolus 1,000 mL (0 mLs Intravenous Stopped 12/17/16 1925)  insulin aspart (novoLOG) injection 10 Units (10 Units Intravenous Given 12/17/16 1839)     Initial Impression / Assessment and Plan / ED Course  I have reviewed the triage vital signs and the nursing notes.  Pertinent labs & imaging results that were available during my care of the patient were reviewed by me and considered in my medical decision making (see chart for details).     Patient's blood sugar improved nicely here with 10 units of insulin and IV fluids.  Now down to the 200s.  No evidence of any diabetic ketoacidosis.   Patient states he has his medications of Glucophage and lisinopril coming tomorrow by mail.  Does not need any medications.  Patient is followed by a local clinic.  He will return for any new or worse symptoms.  Patient will begin his medications as directed.  Final Clinical Impressions(s) / ED Diagnoses   Final diagnoses:  Hyperglycemia    New Prescriptions New Prescriptions   No medications on file     Fredia Sorrow, MD 12/17/16 2126

## 2016-12-17 NOTE — ED Triage Notes (Signed)
Per EMS picked up at [facility], initial call for hypoglycemia but pt was hyperglycemia with EMS arrival, pt 535, weakness, dizziness, lethargy, pt has been out of insulin for a couple days.

## 2016-12-17 NOTE — Discharge Instructions (Signed)
Restart her blood pressure medicine and your diabetic medicine.  Return for any new or worse symptoms.

## 2016-12-19 NOTE — Congregational Nurse Program (Signed)
Congregational Nurse Program Note  Date of Encounter: 12/18/2016  Past Medical History: Past Medical History:  Diagnosis Date  . Bipolar affective ([place])   . Depression   . Diabetes mellitus    uncontrolled   . Hypertension     Encounter Details:     CNP Questionnaire - 12/18/16 1708      Patient Demographics   Is this a new or existing patient? Existing   Patient is considered a/an Not Applicable   Race African-American/Black     Patient Assistance   Location of Patient Assistance Not Applicable   Patient's financial/insurance status Medicaid;Medicare;Low Income   Uninsured Patient ([facility]) No   Patient referred to apply for the following financial assistance Not Applicable   Food insecurities addressed Not Applicable   Transportation assistance No   Assistance securing medications No   Educational Naval architect health     Encounter Details   Primary purpose of visit Chronic Illness/Condition Visit;Safety   Was an Emergency Department visit averted? Not Applicable   Does patient have a medical provider? Yes   Patient referred to Follow up with established PCP   Was a mental health screening completed? (GAINS tool) No   Does patient have dental issues? No   Does patient have vision issues? No   Does your patient have an abnormal blood pressure today? No   Since previous encounter, have you referred patient for abnormal blood pressure that resulted in a new diagnosis or medication change? No   Does your patient have an abnormal blood glucose today? No   Since previous encounter, have you referred patient for abnormal blood glucose that resulted in a new diagnosis or medication change? No   Was there a life-saving intervention made? No     States has been accepted at [facility] in [place].  Transportation has been provided for him by [facility].  He leaves in the morning.

## 2016-12-19 NOTE — Congregational Nurse Program (Signed)
Congregational Nurse Program Note  Date of Encounter: 12/02/2016  Past Medical History: Past Medical History:  Diagnosis Date  . Bipolar affective ([place])   . Depression   . Diabetes mellitus    uncontrolled   . Hypertension     Encounter Details:     CNP Questionnaire - 12/02/16 1702      Patient Demographics   Is this a new or existing patient? Existing   Patient is considered a/an Not Applicable   Race African-American/Black     Patient Assistance   Patient's financial/insurance status Medicaid;Medicare;Low Income   Uninsured Patient ([facility]) No   Patient referred to apply for the following financial assistance Not Applicable   Food insecurities addressed Not Applicable   Transportation assistance No   Assistance securing medications No   Educational Naval architect health     Encounter Details   Primary purpose of visit Chronic Illness/Condition Visit;Safety   Was an Emergency Department visit averted? Not Applicable   Does patient have a medical provider? Yes   Patient referred to Follow up with established PCP   Was a mental health screening completed? (GAINS tool) No   Does patient have dental issues? No   Does patient have vision issues? No   Does your patient have an abnormal blood pressure today? No   Since previous encounter, have you referred patient for abnormal blood pressure that resulted in a new diagnosis or medication change? No   Does your patient have an abnormal blood glucose today? No   Since previous encounter, have you referred patient for abnormal blood glucose that resulted in a new diagnosis or medication change? No   Was there a life-saving intervention made? No     States he wants inpatient for substance abuse.  States he has had treatment in the past and knows this is the right thing for him.  Discussed with him options.  He stated he has been a patient at [facility] in [place] and wants to go back there.   States will call them and see if there are any openings

## 2016-12-19 NOTE — Congregational Nurse Program (Signed)
Congregational Nurse Program Note  Date of Encounter: 12/19/2016  Past Medical History: Past Medical History:  Diagnosis Date  . Bipolar affective ([place])   . Depression   . Diabetes mellitus    uncontrolled   . Hypertension     Encounter Details:     CNP Questionnaire - 11/25/16 1657      Patient Demographics   Is this a new or existing patient? Existing   Patient is considered a/an Not Applicable   Race African-American/Black     Patient Assistance   Location of Patient Assistance [place]   Patient's financial/insurance status Medicaid;Medicare;Low Income   Uninsured Patient ([facility]) No   Patient referred to apply for the following financial assistance Not Applicable   Food insecurities addressed Not Applicable   Transportation assistance No   Assistance securing medications No   Educational health offerings Acute disease     Encounter Details   Primary purpose of visit Acute Illness/Condition Visit   Was an Emergency Department visit averted? Not Applicable   Does patient have a medical provider? Yes   Patient referred to Follow up with established PCP   Was a mental health screening completed? (GAINS tool) No   Does patient have dental issues? No   Does patient have vision issues? No   Does your patient have an abnormal blood pressure today? No   Since previous encounter, have you referred patient for abnormal blood pressure that resulted in a new diagnosis or medication change? No   Does your patient have an abnormal blood glucose today? No   Since previous encounter, have you referred patient for abnormal blood glucose that resulted in a new diagnosis or medication change? No   Was there a life-saving intervention made? No     States was seen in the ED over the weekend for falling.  Is scheduled for a MRI tomorrow.  Referred him to nurse Lisette Abu for followup and bus passes.  Referred to the social worker for medicaid and SCAT

## 2016-12-19 NOTE — Congregational Nurse Program (Signed)
Congregational Nurse Program Note  Date of Encounter: 12/04/2016  Past Medical History: Past Medical History:  Diagnosis Date  . Bipolar affective ([place])   . Depression   . Diabetes mellitus    uncontrolled   . Hypertension     Encounter Details:     CNP Questionnaire - 12/04/16 1706      Patient Demographics   Is this a new or existing patient? Existing   Patient is considered a/an Not Applicable   Race African-American/Black     Patient Assistance   Location of Patient Assistance Not Applicable   Patient's financial/insurance status Medicaid;Medicare;Low Income   Uninsured Patient ([facility]) No   Patient referred to apply for the following financial assistance Not Applicable   Food insecurities addressed Not Applicable   Transportation assistance Yes   Type of Assistance Bus Pass Given   Assistance securing medications No   Educational health offerings Behavioral health     Encounter Details   Primary purpose of visit Chronic Illness/Condition Visit;Safety   Was an Emergency Department visit averted? Not Applicable   Does patient have a medical provider? Yes   Patient referred to Follow up with established PCP   Was a mental health screening completed? (GAINS tool) No   Does patient have dental issues? No   Does patient have vision issues? No   Does your patient have an abnormal blood pressure today? No   Since previous encounter, have you referred patient for abnormal blood pressure that resulted in a new diagnosis or medication change? No   Does your patient have an abnormal blood glucose today? No   Since previous encounter, have you referred patient for abnormal blood glucose that resulted in a new diagnosis or medication change? No   Was there a life-saving intervention made? No     States is waiting on a call back from [place].  Bus passes given for appointment.

## 2016-12-24 ENCOUNTER — Encounter: Payer: Medicare Other | Admitting: Vascular Surgery

## 2016-12-24 ENCOUNTER — Ambulatory Visit (HOSPITAL_COMMUNITY): Payer: Medicare Other | Attending: Vascular Surgery

## 2016-12-31 NOTE — Congregational Nurse Program (Signed)
Congregational Nurse Program Note  Date of Encounter: 11/26/2016  Past Medical History: Past Medical History:  Diagnosis Date  . Bipolar affective ([place])   . Depression   . Diabetes mellitus    uncontrolled   . Hypertension     Encounter Details: CNP Questionnaire - 12/27/16 0900      Questionnaire   Patient Status  Not Applicable    Race  Black or African American    Location Patient [place]  Medicaid;Medicare    Uninsured  Not Applicable    Food  No food insecurities    Housing/Utilities  No permanent housing    Transportation  Provided transportation assistance (bus pass, taxi voucher, etc.)    Interpersonal Safety  Yes, feel physically and emotionally safe where you currently live    Medication  No medication insecurities    Medical Provider  Yes    Referrals  Area Agency    ED Visit Averted  Not Applicable    Life-Saving Intervention Made  Not Applicable      Client scheduled for MRI- needs bus passes. SCAT application completed.

## 2017-01-03 NOTE — Congregational Nurse Program (Signed)
Congregational Nurse Program Note  Date of Encounter: 12/10/2016  Past Medical History: Past Medical History:  Diagnosis Date  . Bipolar affective ([place])   . Depression   . Diabetes mellitus    uncontrolled   . Hypertension     Encounter Details: CNP Questionnaire - 12/27/16 0900      Questionnaire   Patient Status  Not Applicable    Race  Black or African American    Location Patient [place]  Medicaid;Medicare    Uninsured  Not Applicable    Food  No food insecurities    Housing/Utilities  No permanent housing    Transportation  Provided transportation assistance (bus pass, taxi voucher, etc.)    Interpersonal Safety  Yes, feel physically and emotionally safe where you currently live    Medication  No medication insecurities    Medical Provider  Yes    Referrals  Area Agency    ED Visit Averted  Not Applicable    Life-Saving Intervention Made  Not Applicable      Client has completed PCP appointment at [facility]. He states he does not have funds to cover co-pays for medication. CN will have medication transferred from [place] [facility] from [facility] to cover co-pays. Client agrees to plan.

## 2017-07-21 IMAGING — CR DG CHEST 2V
2 series · 2 of 2 positions shown · non-contrast
Comparison: 03/30/2015

CLINICAL DATA: Mid chest pain.

EXAM:
CHEST  2 VIEW

[w chest lat]
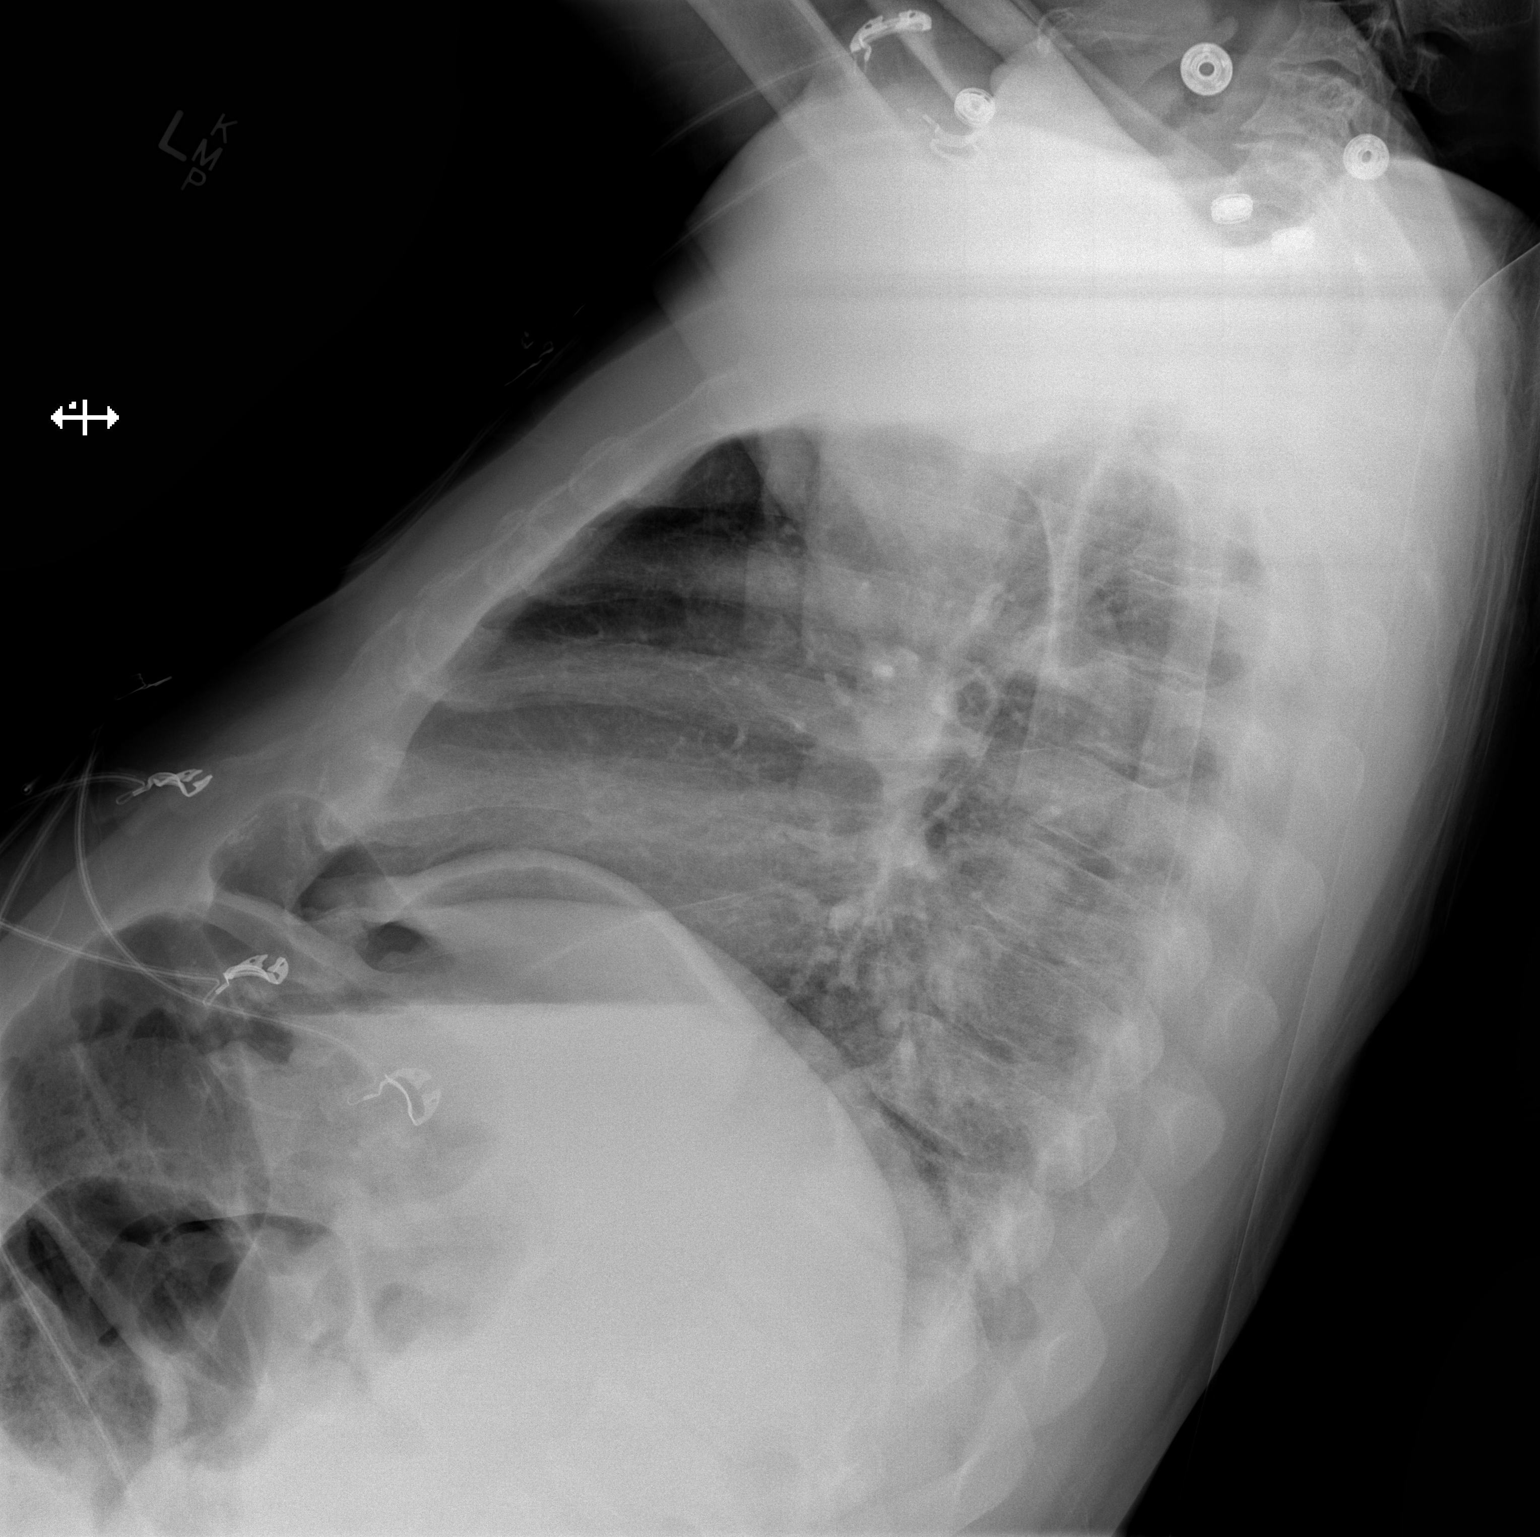

[x chest ap]
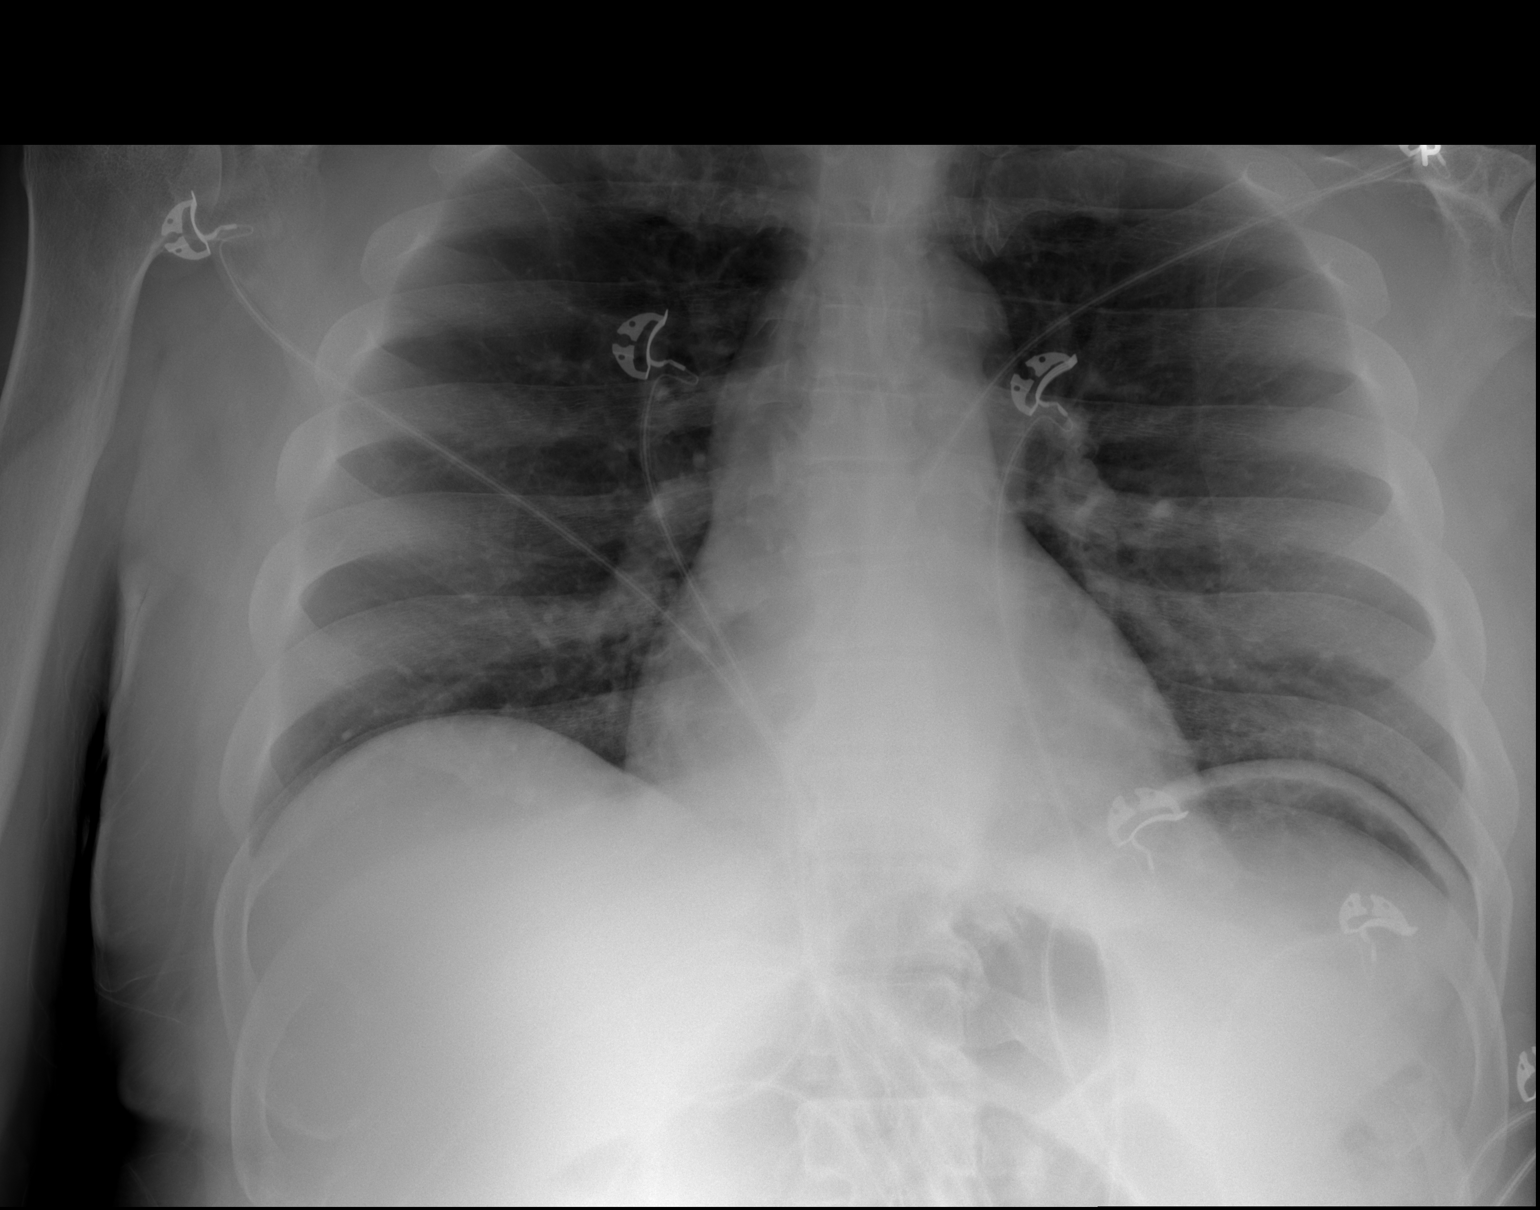

[2 of 2 positions shown; findings below may reference images not displayed]

FINDINGS: The heart size and mediastinal contours are within normal limits.
There is no evidence of pulmonary edema, consolidation,
pneumothorax, nodule or pleural fluid. The visualized skeletal
structures are unremarkable.
IMPRESSION: No active cardiopulmonary disease.

## 2017-07-29 ENCOUNTER — Encounter (HOSPITAL_COMMUNITY): Payer: Self-pay | Admitting: Emergency Medicine

## 2017-07-29 ENCOUNTER — Other Ambulatory Visit: Payer: Self-pay

## 2017-07-29 ENCOUNTER — Emergency Department (HOSPITAL_COMMUNITY)
Admission: EM | Admit: 2017-07-29 | Discharge: 2017-07-30 | Disposition: A | Discharge status: Other Acute Inpatient Hospital | Payer: Medicare Other | Attending: Emergency Medicine | Admitting: Emergency Medicine

## 2017-07-29 DIAGNOSIS — I129 Hypertensive chronic kidney disease with stage 1 through stage 4 chronic kidney disease, or unspecified chronic kidney disease: Secondary | ICD-10-CM | POA: Diagnosis present

## 2017-07-29 DIAGNOSIS — Z87891 Personal history of nicotine dependence: Secondary | ICD-10-CM | POA: Insufficient documentation

## 2017-07-29 DIAGNOSIS — F319 Bipolar disorder, unspecified: Secondary | ICD-10-CM | POA: Insufficient documentation

## 2017-07-29 DIAGNOSIS — F1420 Cocaine dependence, uncomplicated: Secondary | ICD-10-CM | POA: Insufficient documentation

## 2017-07-29 DIAGNOSIS — Z79899 Other long term (current) drug therapy: Secondary | ICD-10-CM | POA: Insufficient documentation

## 2017-07-29 DIAGNOSIS — IMO0002 Reserved for concepts with insufficient information to code with codable children: Secondary | ICD-10-CM | POA: Diagnosis present

## 2017-07-29 DIAGNOSIS — E1165 Type 2 diabetes mellitus with hyperglycemia: Secondary | ICD-10-CM | POA: Insufficient documentation

## 2017-07-29 DIAGNOSIS — R739 Hyperglycemia, unspecified: Secondary | ICD-10-CM

## 2017-07-29 DIAGNOSIS — N179 Acute kidney failure, unspecified: Secondary | ICD-10-CM | POA: Insufficient documentation

## 2017-07-29 DIAGNOSIS — E119 Type 2 diabetes mellitus without complications: Secondary | ICD-10-CM | POA: Diagnosis present

## 2017-07-29 DIAGNOSIS — I10 Essential (primary) hypertension: Secondary | ICD-10-CM | POA: Insufficient documentation

## 2017-07-29 DIAGNOSIS — Z7984 Long term (current) use of oral hypoglycemic drugs: Secondary | ICD-10-CM | POA: Insufficient documentation

## 2017-07-29 DIAGNOSIS — E1169 Type 2 diabetes mellitus with other specified complication: Secondary | ICD-10-CM | POA: Diagnosis present

## 2017-07-29 DIAGNOSIS — F4321 Adjustment disorder with depressed mood: Secondary | ICD-10-CM

## 2017-07-29 DIAGNOSIS — R45851 Suicidal ideations: Secondary | ICD-10-CM | POA: Insufficient documentation

## 2017-07-29 DIAGNOSIS — F4320 Adjustment disorder, unspecified: Secondary | ICD-10-CM | POA: Insufficient documentation

## 2017-07-29 LAB — COMPREHENSIVE METABOLIC PANEL
ALT: 19 U/L (ref 17–63)
AST: 23 U/L (ref 15–41)
Albumin: 3.3 g/dL — ABNORMAL LOW (ref 3.5–5.0)
Alkaline Phosphatase: 68 U/L (ref 38–126)
Anion gap: 10 (ref 5–15)
BUN: 18 mg/dL (ref 6–20)
CO2: 27 mmol/L (ref 22–32)
Calcium: 8.8 mg/dL — ABNORMAL LOW (ref 8.9–10.3)
Chloride: 96 mmol/L — ABNORMAL LOW (ref 101–111)
Creatinine, Ser: 1.25 mg/dL — ABNORMAL HIGH (ref 0.61–1.24)
GFR calc Af Amer: >60 mL/min (ref >60)
GFR calc non Af Amer: >60 mL/min (ref >60)
Glucose, Bld: 517 mg/dL (ref 65–99)
Potassium: 3.8 mmol/L (ref 3.5–5.1)
Sodium: 133 mmol/L — ABNORMAL LOW (ref 135–145)
Total Bilirubin: 0.5 mg/dL (ref 0.3–1.2)
Total Protein: 7 g/dL (ref 6.5–8.1)

## 2017-07-29 LAB — CBC
HCT: 40.5 % (ref 39.0–52.0)
Hemoglobin: 12.7 g/dL — ABNORMAL LOW (ref 13.0–17.0)
MCH: 25.6 pg — ABNORMAL LOW (ref 26.0–34.0)
MCHC: 31.4 g/dL (ref 30.0–36.0)
MCV: 81.7 fL (ref 78.0–100.0)
Platelets: 190 10*3/uL (ref 150–400)
RBC: 4.96 MIL/uL (ref 4.22–5.81)
RDW: 13.2 % (ref 11.5–15.5)
WBC: 4.3 10*3/uL (ref 4.0–10.5)

## 2017-07-29 LAB — CBG MONITORING, ED: Glucose-Capillary: 439 mg/dL — ABNORMAL HIGH (ref 65–99)

## 2017-07-29 LAB — ETHANOL: Alcohol, Ethyl (B): <10 mg/dL (ref <10)

## 2017-07-29 LAB — SALICYLATE LEVEL: Salicylate Lvl: <7 mg/dL (ref 2.8–30.0)

## 2017-07-29 LAB — ACETAMINOPHEN LEVEL: Acetaminophen (Tylenol), Serum: <10 ug/mL — ABNORMAL LOW (ref 10–30)

## 2017-07-29 MED ORDER — GABAPENTIN 300 MG PO CAPS
600.0000 mg | ORAL_CAPSULE | Freq: Three times a day (TID) | ORAL | Status: DC
  Administered 2017-07-30 (×3): 600 mg via ORAL
  Filled 2017-07-29 (×3): qty 2

## 2017-07-29 MED ORDER — INSULIN GLARGINE 100 UNIT/ML ~~LOC~~ SOLN
40.0000 [IU] | Freq: Every day | SUBCUTANEOUS | Status: DC
  Administered 2017-07-30: 40 [IU] via SUBCUTANEOUS
  Filled 2017-07-29 (×2): qty 0.4

## 2017-07-29 MED ORDER — BUPROPION HCL ER (XL) 150 MG PO TB24
150.0000 mg | ORAL_TABLET | Freq: Every day | ORAL | Status: DC
  Administered 2017-07-30: 150 mg via ORAL
  Filled 2017-07-29: qty 1

## 2017-07-29 MED ORDER — INSULIN ASPART 100 UNIT/ML ~~LOC~~ SOLN
10.0000 [IU] | Freq: Once | SUBCUTANEOUS | Status: AC
  Administered 2017-07-29: 10 [IU] via INTRAVENOUS
  Filled 2017-07-29: qty 1

## 2017-07-29 MED ORDER — SODIUM CHLORIDE 0.9 % IV BOLUS
1000.0000 mL | Freq: Once | INTRAVENOUS | Status: AC
  Administered 2017-07-29: 1000 mL via INTRAVENOUS

## 2017-07-29 MED ORDER — LISINOPRIL 20 MG PO TABS
20.0000 mg | ORAL_TABLET | Freq: Every day | ORAL | Status: DC
  Administered 2017-07-30: 20 mg via ORAL
  Filled 2017-07-29: qty 1

## 2017-07-29 NOTE — ED Notes (Signed)
Pt changed into maroon scrubs.

## 2017-07-29 NOTE — ED Triage Notes (Signed)
Pt here for SI. Pt has lost his father and sister and has been very depressed. Pt's bday coming soon and he feels sad and doesn't want to celebrate. Pt has hx of SI in the past. No specific plan.

## 2017-07-29 NOTE — ED Provider Notes (Signed)
Patient placed in Quick Look pathway, seen and evaluated   Chief Complaint: S/I  HPI:   [name] is a 57 y.o. male Pt here for SI. Pt has lost his father and sister and has been very depressed. Pt's bday coming soon and he feels sad and doesn't want to celebrate. Pt has hx of SI in the past. No specific plan. Patient reports he recently moved to [place] from [place].   ROS: Psych: S/I  Physical Exam:  BP (!) 168/103 (BP Location: Right Arm)   Pulse 90   Temp 98.2 F (36.8 C) (Oral)   Resp 16   SpO2 99%    Gen: No distress  Neuro: Awake and Alert  Skin: Warm and dry  Psych: tearful, sad      Initiation of care has begun. The patient has been counseled on the process, plan, and necessity for staying for the completion/evaluation, and the remainder of the medical screening examination    Ashley Murrain, NP 07/29/17 1750    Margette Fast, MD 07/30/17 [phone]
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
## 2017-07-29 NOTE — ED Notes (Signed)
Placed pt's belongings in locker 6. Pt has been wanded

## 2017-07-30 ENCOUNTER — Encounter (HOSPITAL_COMMUNITY): Payer: Self-pay | Admitting: *Deleted

## 2017-07-30 ENCOUNTER — Inpatient Hospital Stay (HOSPITAL_COMMUNITY)
Admission: AD | Admit: 2017-07-30 | Discharge: 2017-08-04 | DRG: 885 | Disposition: A | Discharge status: Home / Self Care | Payer: Medicare Other | Source: Intra-hospital | Attending: Psychiatry | Admitting: Psychiatry

## 2017-07-30 ENCOUNTER — Encounter (HOSPITAL_COMMUNITY): Payer: Self-pay

## 2017-07-30 ENCOUNTER — Other Ambulatory Visit: Payer: Self-pay

## 2017-07-30 DIAGNOSIS — M791 Myalgia, unspecified site: Secondary | ICD-10-CM | POA: Diagnosis not present

## 2017-07-30 DIAGNOSIS — R450 Nervousness: Secondary | ICD-10-CM | POA: Diagnosis not present

## 2017-07-30 DIAGNOSIS — F332 Major depressive disorder, recurrent severe without psychotic features: Secondary | ICD-10-CM | POA: Diagnosis present

## 2017-07-30 DIAGNOSIS — E119 Type 2 diabetes mellitus without complications: Secondary | ICD-10-CM | POA: Diagnosis present

## 2017-07-30 DIAGNOSIS — N289 Disorder of kidney and ureter, unspecified: Secondary | ICD-10-CM | POA: Diagnosis not present

## 2017-07-30 DIAGNOSIS — Z560 Unemployment, unspecified: Secondary | ICD-10-CM | POA: Diagnosis not present

## 2017-07-30 DIAGNOSIS — F419 Anxiety disorder, unspecified: Secondary | ICD-10-CM | POA: Diagnosis not present

## 2017-07-30 DIAGNOSIS — M549 Dorsalgia, unspecified: Secondary | ICD-10-CM | POA: Diagnosis not present

## 2017-07-30 DIAGNOSIS — Z87891 Personal history of nicotine dependence: Secondary | ICD-10-CM | POA: Diagnosis not present

## 2017-07-30 DIAGNOSIS — R45851 Suicidal ideations: Secondary | ICD-10-CM | POA: Diagnosis present

## 2017-07-30 DIAGNOSIS — I10 Essential (primary) hypertension: Secondary | ICD-10-CM | POA: Diagnosis present

## 2017-07-30 DIAGNOSIS — Z9114 Patient's other noncompliance with medication regimen: Secondary | ICD-10-CM | POA: Diagnosis not present

## 2017-07-30 DIAGNOSIS — Z91018 Allergy to other foods: Secondary | ICD-10-CM | POA: Diagnosis not present

## 2017-07-30 DIAGNOSIS — F1410 Cocaine abuse, uncomplicated: Secondary | ICD-10-CM | POA: Diagnosis not present

## 2017-07-30 DIAGNOSIS — Z8049 Family history of malignant neoplasm of other genital organs: Secondary | ICD-10-CM | POA: Diagnosis not present

## 2017-07-30 DIAGNOSIS — Z634 Disappearance and death of family member: Secondary | ICD-10-CM | POA: Diagnosis not present

## 2017-07-30 DIAGNOSIS — G4700 Insomnia, unspecified: Secondary | ICD-10-CM | POA: Diagnosis present

## 2017-07-30 DIAGNOSIS — F1420 Cocaine dependence, uncomplicated: Secondary | ICD-10-CM | POA: Diagnosis present

## 2017-07-30 DIAGNOSIS — Z915 Personal history of self-harm: Secondary | ICD-10-CM | POA: Diagnosis not present

## 2017-07-30 DIAGNOSIS — E1165 Type 2 diabetes mellitus with hyperglycemia: Secondary | ICD-10-CM

## 2017-07-30 DIAGNOSIS — Z8249 Family history of ischemic heart disease and other diseases of the circulatory system: Secondary | ICD-10-CM

## 2017-07-30 DIAGNOSIS — F1490 Cocaine use, unspecified, uncomplicated: Secondary | ICD-10-CM | POA: Diagnosis not present

## 2017-07-30 DIAGNOSIS — Z818 Family history of other mental and behavioral disorders: Secondary | ICD-10-CM

## 2017-07-30 DIAGNOSIS — M2550 Pain in unspecified joint: Secondary | ICD-10-CM | POA: Diagnosis not present

## 2017-07-30 DIAGNOSIS — F333 Major depressive disorder, recurrent, severe with psychotic symptoms: Secondary | ICD-10-CM | POA: Diagnosis not present

## 2017-07-30 DIAGNOSIS — Z823 Family history of stroke: Secondary | ICD-10-CM | POA: Diagnosis not present

## 2017-07-30 LAB — RAPID URINE DRUG SCREEN, HOSP PERFORMED
Amphetamines: NOT DETECTED
Barbiturates: NOT DETECTED
Benzodiazepines: NOT DETECTED
Cocaine: POSITIVE — AB
Opiates: NOT DETECTED
Tetrahydrocannabinol: NOT DETECTED

## 2017-07-30 LAB — CBG MONITORING, ED
Glucose-Capillary: 118 mg/dL — ABNORMAL HIGH (ref 65–99)
Glucose-Capillary: 207 mg/dL — ABNORMAL HIGH (ref 65–99)
Glucose-Capillary: 309 mg/dL — ABNORMAL HIGH (ref 65–99)
Glucose-Capillary: 333 mg/dL — ABNORMAL HIGH (ref 65–99)
Glucose-Capillary: 344 mg/dL — ABNORMAL HIGH (ref 65–99)
Glucose-Capillary: 440 mg/dL — ABNORMAL HIGH (ref 65–99)

## 2017-07-30 LAB — GLUCOSE, CAPILLARY: Glucose-Capillary: 197 mg/dL — ABNORMAL HIGH (ref 65–99)

## 2017-07-30 MED ORDER — GABAPENTIN 300 MG PO CAPS
600.0000 mg | ORAL_CAPSULE | Freq: Three times a day (TID) | ORAL | Status: DC
  Administered 2017-07-31 – 2017-08-04 (×13): 600 mg via ORAL
  Filled 2017-07-30 (×21): qty 2

## 2017-07-30 MED ORDER — INSULIN GLARGINE 100 UNIT/ML ~~LOC~~ SOLN
40.0000 [IU] | Freq: Every day | SUBCUTANEOUS | Status: DC
  Administered 2017-07-30: 40 [IU] via SUBCUTANEOUS

## 2017-07-30 MED ORDER — LISINOPRIL 20 MG PO TABS
20.0000 mg | ORAL_TABLET | Freq: Every day | ORAL | Status: DC
  Administered 2017-07-31 – 2017-08-03 (×4): 20 mg via ORAL
  Filled 2017-07-30 (×8): qty 1

## 2017-07-30 MED ORDER — ACETAMINOPHEN 325 MG PO TABS
650.0000 mg | ORAL_TABLET | Freq: Once | ORAL | Status: AC
  Administered 2017-07-30: 650 mg via ORAL
  Filled 2017-07-30: qty 2

## 2017-07-30 MED ORDER — INSULIN ASPART 100 UNIT/ML ~~LOC~~ SOLN
0.0000 [IU] | Freq: Three times a day (TID) | SUBCUTANEOUS | Status: DC
  Administered 2017-07-30: 11 [IU] via SUBCUTANEOUS
  Filled 2017-07-30: qty 1

## 2017-07-30 MED ORDER — INSULIN ASPART 100 UNIT/ML ~~LOC~~ SOLN: 0.0000 [IU] | Freq: Every day | SUBCUTANEOUS | Status: DC

## 2017-07-30 MED ORDER — BUPROPION HCL ER (XL) 150 MG PO TB24
150.0000 mg | ORAL_TABLET | Freq: Every day | ORAL | Status: DC
  Administered 2017-07-31: 150 mg via ORAL
  Filled 2017-07-30 (×4): qty 1

## 2017-07-30 MED ORDER — INSULIN DEGLUDEC 100 UNIT/ML ~~LOC~~ SOPN: 40.0000 [IU] | PEN_INJECTOR | Freq: Every day | SUBCUTANEOUS | Status: DC

## 2017-07-30 MED ORDER — INSULIN ASPART 100 UNIT/ML ~~LOC~~ SOLN
0.0000 [IU] | Freq: Three times a day (TID) | SUBCUTANEOUS | Status: DC
  Administered 2017-07-31: 2 [IU] via SUBCUTANEOUS
  Administered 2017-07-31: 5 [IU] via SUBCUTANEOUS
  Administered 2017-07-31: 3 [IU] via SUBCUTANEOUS
  Administered 2017-08-01: 5 [IU] via SUBCUTANEOUS
  Administered 2017-08-01: 9 [IU] via SUBCUTANEOUS
  Administered 2017-08-03: 7 [IU] via SUBCUTANEOUS

## 2017-07-30 MED ORDER — INSULIN ASPART 100 UNIT/ML ~~LOC~~ SOLN
8.0000 [IU] | Freq: Once | SUBCUTANEOUS | Status: AC
  Administered 2017-07-30: 8 [IU] via SUBCUTANEOUS

## 2017-07-30 MED ORDER — INSULIN ASPART 100 UNIT/ML ~~LOC~~ SOLN
8.0000 [IU] | Freq: Once | SUBCUTANEOUS | Status: DC
  Filled 2017-07-30: qty 1

## 2017-07-30 NOTE — ED Notes (Signed)
Checked patient blood sugar it was 333 notified RN Janee of blood sugar

## 2017-07-30 NOTE — ED Notes (Signed)
Breakfast tray ordered 

## 2017-07-30 NOTE — ED Notes (Signed)
Pt alert and compliant Ambulated to bathroom VS documented Pt eating graham crackers and diet sprite

## 2017-07-30 NOTE — Progress Notes (Signed)
Pt accepted to  [facility], Bed 301-2 Roy Romp, NP is the accepting provider.  Dr.  Mallie Darting is the attending provider.  Call report to [phone]  @ [facility] ED notified.   Pt is Voluntary.  Pt may be transported by [facility]  Pt scheduled  to arrive at [facility] when CBG is under 300.  Areatha Keas. Judi Cong, MSW, [place] Disposition Clinical Social Work [phone] (cell) [phone] (office)

## 2017-07-30 NOTE — ED Notes (Addendum)
TTS at bedside. 

## 2017-07-30 NOTE — ED Notes (Signed)
Pt arrived to F11 - pt ambulates w/cane. Pt wearing burgundy scrubs. Pt alert, oriented. Per Gar Gibbon, [facility] - pt has been accepted to [facility] - if CBG remains under 300. Requested for CBG to be checked now and recheck prior to eating dinner. CBG 207. Pt aware and voiced understanding and agreement w/tx plan.

## 2017-07-30 NOTE — ED Notes (Signed)
Patient given new maroon scrubs, sitting on the side of the bed eating peanut butter and gram crackers.

## 2017-07-30 NOTE — Progress Notes (Signed)
Inpatient Diabetes Program Recommendations  AACE/ADA: New Consensus Statement on Inpatient Glycemic Control (2015)  Target Ranges:  Prepandial:   less than 140 mg/dL      Peak postprandial:   less than 180 mg/dL (1-2 hours)      Critically ill patients:  140 - 180 mg/dL   Lab Results  Component Value Date   GLUCAP 440 (H) 07/30/2017   HGBA1C 14.0 (H) 12/31/2013    Review of Glycemic ControlResults for Roy, [name] (MRN [id number]) as of 07/30/2017 14:38  Ref. Range 07/29/2017 22:51 07/30/2017 00:16 07/30/2017 08:49 07/30/2017 14:11  Glucose-Capillary Latest Ref Range: 65 - 99 mg/dL 439 (H) 309 (H) 333 (H) 440 (H)    Diabetes history: Type 2 DM  Outpatient Diabetes medications: Tresiba 40 units q HS, Metformin 1000 mg bid Current orders for Inpatient glycemic control:  Novolog moderate tid with meals and HS, Lantus 40 units q HS  Inpatient Diabetes Program Recommendations:   Note blood sugars increased in ED. Correction Novolog just added.   Please consider checking A1C.  Also will update diet to CHO modified.   Thanks  Adah Perl, RN, BC-ADM Inpatient Diabetes Coordinator Pager [phone] (8a-5p)

## 2017-07-30 NOTE — ED Notes (Signed)
Pt resting in bed.

## 2017-07-30 NOTE — Consult Note (Signed)
Medical Consultation   [name]  XFG:[id number]  DOB: Mar 17, 1960  DOA: 07/29/2017  PCP: [facility]   Outpatient Specialists: psych  Requesting physician: Delphina Cahill, MD  Reason for consultation: diabetes management  History of Present Illness: [name] is an 57 y.o. male with hx DM2, hypertension, bipolar disorder, hx depression with suicidal ideation with past attempts presents with today with suicidal ideation. Pt has been seen and evaluated by inpt psych team who feel pt is appropriate for inpt psych treatment. Pt with recent stressors including death of father and sister. TRH has been asked to consult regarding blood sugar management as glucose high in ED. Pt's blood sugar as high as 517 after prolonged stay in ED. No evidence of DKA. He has been noncompliant with home medications and reports no meds since Saturday. UDS positive cocaine. Last A1C on file was 14.0% from 2015. It appears he was at [facility] ED on 07/29/17 with c/o hyperglycemia and "running out of meds" at which time they were refilled.   Pt has received 40 units Lantus and 10 units of Novolog along with 1L fluid bolus in the ED. He denies recent fever, headache, dizziness, chest pain, abdominal pain, n/v/d, polyuria, polydipsia, polyphagia. Chronic LE swelling is unchanged.   Review of Systems:  ROS As per HPI otherwise 10 point review of systems negative.    Past Medical History: Past Medical History:  Diagnosis Date  . Bipolar affective ([place])   . Depression   . Diabetes mellitus    uncontrolled   . Hypertension     Past Surgical History: Past Surgical History:  Procedure Laterality Date  . KNEE SURGERY     b/l knees; pending total knee      Allergies:   Allergies  Allergen Reactions  . Other Anaphylaxis    Allergy to GRITS , unknown exactly the specific ingredient he is allergic too     Social History:  reports that he has quit smoking. He has never  used smokeless tobacco. He reports that he has current or past drug history. Drug: Cocaine. He reports that he does not drink alcohol.   Family History: Family History  Problem Relation Age of Onset  . Diabetes Unknown   . Cancer Mother        uterus; deceased   . Hypertension Unknown   . Heart attack Father        alive   . Stroke Sister        12/2012 age 28 y.o      Physical Exam: Vitals:   07/29/17 1857 07/30/17 0625 07/30/17 0628 07/30/17 1515  BP: (!) 160/116 (!) 127/106 (!) 127/106 128/83  Pulse: 91 76 76 82  Resp: 16  20   Temp:  97.6 F (36.4 C) 97.6 F (36.4 C)   TempSrc:  Oral Oral   SpO2: 100% 98% 98% 99%  Weight:   117.9 kg (260 lb)   Height:   5\' 8"  (1.727 m)     Constitutional: Alert and awake, oriented x3, not in any acute distress. Eyes: PERLA, EOMI, irises appear normal, anicteric sclera,  ENMT: external ears and nose appear normal, normal hearing, Lips appear normal, oropharynx mucosa, tongue, posterior pharynx appear normal  Neck: neck appears normal, no masses, normal ROM, no thyromegaly, no JVD  CVS: S1-S2 clear, no murmur rubs or gallops, 1+ pedal and ankle edema R>L. normal  pedal pulses  Respiratory:  clear to auscultation bilaterally, no wheezing, rales or rhonchi. Respiratory effort normal. No accessory muscle use.  Abdomen: soft nontender, nondistended, normal bowel sounds, no hepatosplenomegaly, no hernias  Musculoskeletal: : no cyanosis, clubbing or edema noted bilaterally Neuro: Cranial nerves II-XII intact, strength, sensation, reflexes Psych: judgement and insight appear normal, stable mood and affect, mental status Skin: no rashes or lesions or ulcers, no induration or nodules    Data reviewed:  I have personally reviewed the recent labs and imaging studies  Pertinent Labs:  CBC unremarkable Na+ 133 K+3.8 CO2 27 AG 10 LFTs normal    Inpatient Medications:   Scheduled Meds: . buPROPion  150 mg Oral Daily  . gabapentin  600  mg Oral TID  . insulin aspart  0-15 Units Subcutaneous TID WC  . insulin aspart  0-5 Units Subcutaneous QHS  . insulin glargine  40 Units Subcutaneous QHS  . lisinopril  20 mg Oral Daily   Continuous Infusions:   Radiological Exams on Admission: No results found.  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 No evidence of DKA -Pt states he takes [place] 40units qhs. This can be continued at discharge. If not available at inpatient psych facility then ok to give Lantus 40units SQ qhs and SSI resistant scale qac/hs. -would give 10units Novolog now -He had been on Metformin in the past, but was "tired of taking meds" so stopped taking. He is agreeable to oral agent now so would start Metformin 1000mg  BID. He does not wish to be on any more insulin and states he will only do one shot a day -At discharge, would continue pt's [place] with close outpt f/u   Thank you for this consultation.  Please let TRH know if further assistance needed.    Time Spent: 1 hour  Patrici Ranks, NP-C [place]  pgr [phone]

## 2017-07-30 NOTE — BH Assessment (Addendum)
Tele Assessment Note   Patient Name: [name] MRN: [id number] Referring Physician: Janetta Hora, PA-C Location of Patient: [facility] Location of Provider: [place] is an 57 y.o. male who presents voluntarily to [facility] reporting symptoms of depression and suicidal ideation. Pt has a history of depression and suicidal ideation. Pt denies current suicidal ideation but denies having a plan. Pt reports 3 past attempts that he can remember. Pt acknowledges symptoms including: sadness, fatigue, guilt, low self esteem, tearfulness, isolating, lack of motivation, anger, irritability, negative outlook, difficulty concentrating, helplessness, hopelessness, sleeping less, eating less, excessive worry and intrusive thoughts.  Pt denies homicidal ideation/ history of violence. Pt reports auditory hallucinations, and denies visual hallucinations or other psychotic symptoms. Pt states current stressors include the recent death of his father and sister.   Pt reports he was living with his father, but since his death he has been unable to go inside the house.  Pt denies having any supports.  Pt denies history of abuse and trauma. Pt reports there is a family history of SA. Pt is disabled and unemployed. Pt has poor insight and impaired judgment. Pt's memory is intact.  Pt denies legal history.  Pt denies OP history. IP history includes an admission to [facility] in July 2015.  Pt denies alcohol and reports substance abuse of cocaine.  Pt is dressed in scrubs, alert, oriented x4 with normal speech and normal motor behavior. Eye contact is good. Pt's mood is depressed and affect is congruent with mood. Thought process is coherent and relevant. There is no indication pt is currently responding to internal stimuli or experiencing delusional thought content. Pt was cooperative throughout assessment. Pt is currently unable to contract for safety outside the hospital and wants inpatient  psychiatric treatment.   Diagnosis: F33.3 Major depressive disorder, Recurrent episode, With psychotic features  Past Medical History:  Past Medical History:  Diagnosis Date  . Bipolar affective ([place])   . Depression   . Diabetes mellitus    uncontrolled   . Hypertension     Past Surgical History:  Procedure Laterality Date  . KNEE SURGERY     b/l knees; pending total knee     Family History:  Family History  Problem Relation Age of Onset  . Diabetes Unknown   . Cancer Mother        uterus; deceased   . Hypertension Unknown   . Heart attack Father        alive   . Stroke Sister        12/2012 age 29 y.o    Social History:  reports that he has quit smoking. He has never used smokeless tobacco. He reports that he has current or past drug history. Drug: Cocaine. He reports that he does not drink alcohol.  Additional Social History:  Alcohol / Drug Use Pain Medications: See MAR Prescriptions: See MAR Over the Counter: See MAR History of alcohol / drug use?: Yes Longest period of sobriety (when/how long): 16 years Substance #1 Name of Substance 1: Cocaine 1 - Age of First Use: 40 1 - Amount (size/oz): Varies 1 - Frequency: Stopped for 16 years 1 - Duration: Stopped for 16 years 1 - Last Use / Amount: 07/28/17  CIWA: CIWA-Ar BP: (!) 160/116 Pulse Rate: 91 COWS:    Allergies:  Allergies  Allergen Reactions  . Other Anaphylaxis    Allergy to GRITS , unknown exactly the specific ingredient he is allergic too  Home Medications:  (Not in a hospital admission)  OB/GYN Status:  No LMP for male patient.  General Assessment Data Location of Assessment: [facility] ED TTS Assessment: In system Is this a Tele or Face-to-Face Assessment?: Tele Assessment Is this an Initial Assessment or a Re-assessment for this encounter?: Initial Assessment Marital status: Divorced [place] name: NA Is patient pregnant?: No Pregnancy Status: No Living Arrangements: Alone Can pt return  to current living arrangement?: Yes Admission Status: Voluntary Is patient capable of signing voluntary admission?: Yes Referral Source: Self/Family/Friend Insurance type: Medicare     Crisis Care Plan Living Arrangements: Alone Name of Psychiatrist: None Name of Therapist: None  Education Status Is patient currently in school?: No Is the patient employed, unemployed or receiving disability?: Receiving disability income, Unemployed  Risk to self with the past 6 months Suicidal Ideation: Yes-Currently Present Has patient been a risk to self within the past 6 months prior to admission? : Yes Suicidal Intent: Yes-Currently Present Has patient had any suicidal intent within the past 6 months prior to admission? : Yes Is patient at risk for suicide?: Yes Suicidal Plan?: Yes-Currently Present Has patient had any suicidal plan within the past 6 months prior to admission? : No Access to Means: Yes Specify Access to Suicidal Means: Pt reports he can get his hands on means if necessary What has been your use of drugs/alcohol within the last 12 months?: Pt reports using cocaine Previous Attempts/Gestures: Yes How many times?: 3 Other Self Harm Risks: Pt denies Triggers for Past Attempts: Unpredictable Intentional Self Injurious Behavior: None Family Suicide History: No Recent stressful life event(s): Loss (Comment)(Pt reports his father and sister died in a car accident) Persecutory voices/beliefs?: No Depression: Yes Depression Symptoms: Despondent, Insomnia, Isolating, Tearfulness, Fatigue, Guilt, Loss of interest in usual pleasures, Feeling worthless/self pity, Feeling angry/irritable Substance abuse history and/or treatment for substance abuse?: No Suicide prevention information given to non-admitted patients: Not applicable  Risk to Others within the past 6 months Homicidal Ideation: No Does patient have any lifetime risk of violence toward others beyond the six months prior to  admission? : No Thoughts of Harm to Others: No Current Homicidal Intent: No Current Homicidal Plan: No Access to Homicidal Means: No Identified Victim: Pt denies History of harm to others?: No Assessment of Violence: None Noted Violent Behavior Description: Pt denies Does patient have access to weapons?: No(Pt denies having access but reports being able to obtain ) Criminal Charges Pending?: No Does patient have a court date: No Is patient on probation?: No  Psychosis Hallucinations: None noted Delusions: None noted  Mental Status Report Appearance/Hygiene: In scrubs Eye Contact: Good Motor Activity: Unremarkable Speech: Logical/coherent Level of Consciousness: Alert Mood: Depressed Affect: Depressed Anxiety Level: None Thought Processes: Coherent, Relevant Judgement: Impaired Orientation: Person, Place, Time, Situation, Appropriate for developmental age Obsessive Compulsive Thoughts/Behaviors: None  Cognitive Functioning Concentration: Normal Memory: Recent Intact, Remote Intact Is patient IDD: No Is patient DD?: No Insight: Poor Impulse Control: Poor Appetite: Poor Have you had any weight changes? : No Change Sleep: Increased Total Hours of Sleep: 0 Vegetative Symptoms: None  ADLScreening [facility]) Patient's cognitive ability adequate to safely complete daily activities?: Yes Patient able to express need for assistance with ADLs?: Yes Independently performs ADLs?: Yes (appropriate for developmental age)  Prior Inpatient Therapy Prior Inpatient Therapy: Yes Prior Therapy Dates: July 2015 Prior Therapy Facilty/Provider(s): [facility] Reason for Treatment: Depression  Prior Outpatient Therapy Prior Outpatient Therapy: No Does patient have an ACCT  team?: No Does patient have Intensive In-House Services?  : No Does patient have [facility] services? : No Does patient have P4CC services?: No  ADL Screening (condition at time of  admission) Patient's cognitive ability adequate to safely complete daily activities?: Yes Is the patient deaf or have difficulty hearing?: No Does the patient have difficulty seeing, even when wearing glasses/contacts?: No Does the patient have difficulty concentrating, remembering, or making decisions?: No Patient able to express need for assistance with ADLs?: Yes Does the patient have difficulty dressing or bathing?: No Independently performs ADLs?: Yes (appropriate for developmental age) Does the patient have difficulty walking or climbing stairs?: Yes(Pt reports being injured on the job which has left him with damage to his right knee.) Weakness of Legs: Right(Pt reports being injured on the job which has left him with damage to his right knee.) Weakness of Arms/Hands: None  Home Assistive Devices/Equipment Home Assistive Devices/Equipment: Cane (specify quad or straight)    Abuse/Neglect Assessment (Assessment to be complete while patient is alone) Abuse/Neglect Assessment Can Be Completed: Yes Physical Abuse: Denies Verbal Abuse: Denies Sexual Abuse: Denies Exploitation of patient/patient's resources: Denies Self-Neglect: Denies     Regulatory affairs officer (For Healthcare) Does Patient Have a Medical Advance Directive?: No Would patient like information on creating a medical advance directive?: No - Patient declined          Disposition:  Gave clinical report to Lindon Romp, NP who stated pt meets criteria for inpatient psychiatric treatment.  Tori, RN and [facility] stated there are no appropriate beds for the pt.  TTS to seek placement.  Notified Megan, RN and Dr Marlon Pel of recommendation.  Disposition Initial Assessment Completed for this Encounter: Yes Patient referred to: Other (Comment)(TTS to seek placement)  This service was provided via telemedicine using a 2-way, interactive audio and video technology.  Names of all persons participating in this telemedicine  service and their role in this encounter. Name: Lorin Glass Role: Patient  Name: Abran Cantor, MS, [facility] Role: TTS Counselor  Name:  Role:   Name:  Role:    Abran Cantor, [place], [facility] Therapeutic Triage Specialist

## 2017-07-30 NOTE — ED Notes (Signed)
Attempted to call report x2

## 2017-07-30 NOTE — Tx Team (Signed)
Initial Treatment Plan 07/30/2017 9:22 PM [name] JWL:[id number]    PATIENT STRESSORS: Health problems Loss of father and sister   PATIENT STRENGTHS: Ability for insight Average or above average intelligence Capable of independent living Communication skills General fund of knowledge Motivation for treatment/growth   PATIENT IDENTIFIED PROBLEMS: Depression Suicidal thoughts "Lost my father and sister recently" "Help with depression and grieving"                     DISCHARGE CRITERIA:  Ability to meet basic life and health needs Improved stabilization in mood, thinking, and/or behavior Reduction of life-threatening or endangering symptoms to within safe limits Verbal commitment to aftercare and medication compliance  PRELIMINARY DISCHARGE PLAN: Attend aftercare/continuing care group Return to previous living arrangement  PATIENT/FAMILY INVOLVEMENT: This treatment plan has been presented to and reviewed with the patient, [name], and/or family member, .  The patient and family have been given the opportunity to ask questions and make suggestions.  [place], [place], [place] 07/30/2017, 9:22 PM

## 2017-07-30 NOTE — ED Notes (Signed)
Patient refused snack.

## 2017-07-30 NOTE — ED Notes (Signed)
Call received from [place] at [facility]. Requested update on patients CBG. Per [facility] patient's CBG must be less than 300 for held bed.

## 2017-07-30 NOTE — ED Notes (Signed)
Patient requested medication for headache, RN paged MD.

## 2017-07-30 NOTE — Progress Notes (Signed)
[name] is a 57 year old male pt admitted on voluntary basis. On admission he spoke about the deaths of his father and sister recently and reports that he has been feeling depressed and suicidal. He does endorse passive SI on admission but is able to contract for safety while in the hospital. He reports that he relapsed on cocaine recently and spoke about how that was the first time in 16 years he has done that. He denies any other current substance abuse issues. He reports that he goes to [facility] center for medication management and reports that he takes his medications as prescribed. He is unsteady and slow and reports 2 total knee replacement surgeries as well as back surgery this past January. He reports that he requires a cane to help ambulate. He reports that he is able to return to the house that he was living in but is unsure if he wants to return back there since he is all alone in the home now. [name] was oriented to the unit and safety maintained

## 2017-07-30 NOTE — ED Provider Notes (Signed)
Patient recommend for inpatient treatment.  TSS will seek placement.   Montine Circle, PA-C 07/30/17 0200    Veryl Speak, MD 07/30/17 [phone]

## 2017-07-30 NOTE — Progress Notes (Signed)
Disposition CSW contacted pt's ED Nurse, Judith Part., RN, at the request of [place]., to ask that TTS/Disposition be contacted once pt's blood sugars are under 300.    Areatha Keas. Judi Cong, MSW, [place] Disposition Clinical Social Work [phone] (cell) [phone] (office)

## 2017-07-30 NOTE — ED Notes (Signed)
Patient sleeping at this time.

## 2017-07-30 NOTE — ED Notes (Signed)
EDP notified of current CBG

## 2017-07-30 NOTE — ED Notes (Addendum)
Belongings in locker 6, pt in purple scrubs, valuables with security, pt signed medical clearance policy and given a copy.

## 2017-07-30 NOTE — ED Notes (Signed)
[place] notified for transport.

## 2017-07-30 NOTE — Progress Notes (Signed)
Patient ID: [name], male   DOB: Aug 07, 1960, 57 y.o.   MRN: [id number] Per State regulations 482.30 this chart was reviewed for medical necessity with respect to the patient's admission/duration of stay.    Next review date: 08/03/17  Debarah Crape, BSN, RN-BC  Case Manager

## 2017-07-30 NOTE — ED Notes (Signed)
EDP updated on patient's CBG results.

## 2017-07-30 NOTE — ED Notes (Addendum)
Valuables have been collected from security at this time. Pt's valuables and belongings are together on [place] desk at this time prior to transport to [facility].

## 2017-07-30 NOTE — ED Notes (Signed)
Carb Modified Diet ordered for Dinner.

## 2017-07-31 DIAGNOSIS — F333 Major depressive disorder, recurrent, severe with psychotic symptoms: Secondary | ICD-10-CM

## 2017-07-31 DIAGNOSIS — Z813 Family history of other psychoactive substance abuse and dependence: Secondary | ICD-10-CM

## 2017-07-31 DIAGNOSIS — F419 Anxiety disorder, unspecified: Secondary | ICD-10-CM

## 2017-07-31 DIAGNOSIS — R450 Nervousness: Secondary | ICD-10-CM

## 2017-07-31 DIAGNOSIS — Z915 Personal history of self-harm: Secondary | ICD-10-CM

## 2017-07-31 DIAGNOSIS — Z560 Unemployment, unspecified: Secondary | ICD-10-CM

## 2017-07-31 DIAGNOSIS — Z634 Disappearance and death of family member: Secondary | ICD-10-CM

## 2017-07-31 DIAGNOSIS — Z818 Family history of other mental and behavioral disorders: Secondary | ICD-10-CM

## 2017-07-31 DIAGNOSIS — M549 Dorsalgia, unspecified: Secondary | ICD-10-CM

## 2017-07-31 DIAGNOSIS — M791 Myalgia, unspecified site: Secondary | ICD-10-CM

## 2017-07-31 DIAGNOSIS — M2550 Pain in unspecified joint: Secondary | ICD-10-CM

## 2017-07-31 DIAGNOSIS — F1420 Cocaine dependence, uncomplicated: Secondary | ICD-10-CM

## 2017-07-31 DIAGNOSIS — G4700 Insomnia, unspecified: Secondary | ICD-10-CM

## 2017-07-31 DIAGNOSIS — R45851 Suicidal ideations: Secondary | ICD-10-CM

## 2017-07-31 LAB — GLUCOSE, CAPILLARY
Glucose-Capillary: 203 mg/dL — ABNORMAL HIGH (ref 65–99)
Glucose-Capillary: 197 mg/dL — ABNORMAL HIGH (ref 65–99)
Glucose-Capillary: 278 mg/dL — ABNORMAL HIGH (ref 65–99)
Glucose-Capillary: 285 mg/dL — ABNORMAL HIGH (ref 65–99)

## 2017-07-31 MED ORDER — INSULIN GLARGINE 100 UNIT/ML ~~LOC~~ SOLN
50.0000 [IU] | Freq: Every day | SUBCUTANEOUS | Status: DC
  Administered 2017-07-31: 50 [IU] via SUBCUTANEOUS

## 2017-07-31 MED ORDER — TRAZODONE HCL 100 MG PO TABS
100.0000 mg | ORAL_TABLET | Freq: Every evening | ORAL | Status: DC | PRN
  Filled 2017-07-31 (×2): qty 1

## 2017-07-31 MED ORDER — IBUPROFEN 800 MG PO TABS
800.0000 mg | ORAL_TABLET | Freq: Three times a day (TID) | ORAL | Status: DC | PRN
  Administered 2017-07-31 – 2017-08-03 (×4): 800 mg via ORAL
  Filled 2017-07-31 (×4): qty 1

## 2017-07-31 MED ORDER — CLONIDINE HCL 0.1 MG PO TABS
0.1000 mg | ORAL_TABLET | Freq: Three times a day (TID) | ORAL | Status: DC | PRN
  Administered 2017-07-31 – 2017-08-01 (×2): 0.1 mg via ORAL
  Filled 2017-07-31 (×3): qty 1

## 2017-07-31 NOTE — BHH Group Notes (Signed)
LCSW Group Therapy Note  07/31/2017 1:15pm  Type of Therapy/Topic:  Group Therapy:  Feelings about Diagnosis  Participation Level:  Active   Description of Group:   This group will allow patients to explore their thoughts and feelings about diagnoses they have received. Patients will be guided to explore their level of understanding and acceptance of these diagnoses. Facilitator will encourage patients to process their thoughts and feelings about the reactions of others to their diagnosis and will guide patients in identifying ways to discuss their diagnosis with significant others in their lives. This group will be process-oriented, with patients participating in exploration of their own experiences, giving and receiving support, and processing challenge from other group members.   Therapeutic Goals: 1. Patient will demonstrate understanding of diagnosis as evidenced by identifying two or more symptoms of the disorder 2. Patient will be able to express two feelings regarding the diagnosis 3. Patient will demonstrate their ability to communicate their needs through discussion and/or role play  Summary of Patient Progress:  Lanell was attentive and engaged during today's processing group. He shared that he is happy to learn of his diagnosis because it "explains my symptoms." Champion shared that he does not have anyone to call if he is struggling and is interested in AA/NA groups to learn more about support. Wynter continues to show progress in the group setting with improving insight.   Therapeutic Modalities:   Cognitive Behavioral Therapy Brief Therapy Feelings Identification    Avelina Laine, LCSW 07/31/2017 3:43 PM

## 2017-07-31 NOTE — BHH Suicide Risk Assessment (Signed)
[place] INPATIENT:  Family/Significant Other Suicide Prevention Education  Suicide Prevention Education:  Patient Refusal for Family/Significant Other Suicide Prevention Education: The patient [name] has refused to provide written consent for family/significant other to be provided Family/Significant Other Suicide Prevention Education during admission and/or prior to discharge.  Physician notified.  SPE completed with pt, as pt refused to consent to family contact. SPI pamphlet provided to pt and pt was encouraged to share information with support network, ask questions, and talk about any concerns relating to SPE. Pt denies access to guns/firearms and verbalized understanding of information provided. Mobile Crisis information also provided to pt.   Avelina Laine LCSW 07/31/2017, 11:35 AM
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
## 2017-07-31 NOTE — Plan of Care (Signed)
Problem: Health Behavior/Discharge Planning: Goal: Compliance with treatment plan for underlying cause of condition will improve Intervention: Patient encouraged to take medications as prescribed and attend groups. Patient encouraged to be active in their recovery. Outcome: Patient is attending groups, taking medications as prescribed and complying with goals of treatment. 07/31/2017 2:05 PM - Progressing by Annia Friendly, RN  scores remain low. 07/31/2017 2:05 PM - Progressing by Annia Friendly, RN   Problem: Safety: Goal: Periods of time without injury will increase Intervention: Patient contracts for safety on the unit. High fall risk precautions in place. Safety monitored with q15 minute checks. Outcome: Patient remains safe on the unit at this time. 07/31/2017 2:05 PM - Progressing by Annia Friendly, RN

## 2017-07-31 NOTE — H&P (Signed)
Psychiatric Admission Assessment Adult  Patient Identification: [name] MRN:  [id number] Date of Evaluation:  07/31/2017 Chief Complaint:  MDD WITH PSYCHOTIC FEATURES Principal Diagnosis: MDD (major depressive disorder), recurrent episode, severe ([place]) Diagnosis:   Patient Active Problem List   Diagnosis Date Noted  . MDD (major depressive disorder), recurrent episode, severe ([place]) [F33.2] 07/30/2017  . Cocaine use disorder, moderate, dependence ([place]) [F14.20] 01/04/2015  . Severe recurrent major depression with psychotic features ([place]) [F33.3] 01/04/2015  . Depression, major, recurrent, severe with psychosis ([place]) [F33.3] 08/29/2013  . Suicidal ideations [R45.851] 08/28/2013  . Chest pain [R07.9] 06/30/2013  . HTN (hypertension) [I10] 06/30/2013  . Uncontrolled diabetes mellitus ([place]) [E11.65] 06/30/2013  . Bipolar affect, depressed ([place]) [F31.30] 05/13/2011   History of Present Illness:  07/30/17 [facility] Counselor Assessment: 57 y.o. male who presents voluntarily to [facility] reporting symptoms of depression and suicidal ideation. Pt has a history of depression and suicidal ideation. Pt denies current suicidal ideation but denies having a plan. Pt reports 3 past attempts that he can remember. Pt acknowledges symptoms including: sadness, fatigue, guilt, low self esteem, tearfulness, isolating, lack of motivation, anger, irritability, negative outlook, difficulty concentrating, helplessness, hopelessness, sleeping less, eating less, excessive worry and intrusive thoughts.  Pt denies homicidal ideation/ history of violence. Pt reports auditory hallucinations, and denies visual hallucinations or other psychotic symptoms. Pt states current stressors include the recent death of his father and sister.  Pt reports he was living with his father, but since his death he has been unable to go inside the house.  Pt denies having any supports.  Pt denies history of abuse and trauma. Pt reports there is a family  history of SA. Pt is disabled and unemployed. Pt has poor insight and impaired judgment. Pt's memory is intact.  Pt denies legal history. Pt denies OP history. IP history includes an admission to [facility] in July 2015. Pt denies alcohol and reports substance abuse of cocaine. Pt is dressed in scrubs, alert, oriented x4 with normal speech and normal motor behavior. Eye contact is good. Pt's mood is depressed and affect is congruent with mood. Thought process is coherent and relevant. There is no indication pt is currently responding to internal stimuli or experiencing delusional thought content. Pt was cooperative throughout assessment. Pt is currently unable to contract for safety outside the hospital and wants inpatient psychiatric treatment.   On evaluation today: Patient confirms the above information today.  Patient states that he has been feeling more depressed due to the fact to bring his father and his sister last 06-20-22 after they were died in a motor vehicle accident.  He reports that he is 100% disabled.  However patient has changed his statement on the last use of cocaine.  Now patient states that is been a couple years ago and he reports that he does not remember being admitted to [facility] for cocaine abuse and MDD or to [facility] for reported hallucinations.  Patient is a poor historian on his history of admissions as well as been a poor historian on his medications in the past.  Patient states that he is only used cocaine once since his father and sister passed away.  He does report that he was following up at [facility] and plans to return to [facility] for follow-up after discharge.  He also reports that he has had multiple previous suicide attempts and states that one was him jumping off a bridge and another was him  putting a gun in his mouth and pulled the trigger and none of them killed him.    Associated Signs/Symptoms: Depression Symptoms:  depressed  mood, anhedonia, insomnia, fatigue, feelings of worthlessness/guilt, hopelessness, suicidal thoughts with specific plan, anxiety, loss of energy/fatigue, disturbed sleep, (Hypo) Manic Symptoms:  Denies Anxiety Symptoms:  Excessive Worry, Psychotic Symptoms:  Denies PTSD Symptoms: NA Total Time spent with patient: 45 minutes  Past Psychiatric History: Multiple hospitalizations for reported hallucinations, cocaine abuse, MDD.  Admitted to the [facility] in January 2019 and was admitted to [facility] in 2017.  He reports a history of Lexapro and trazodone.  Patient states he is currently on Wellbutrin.  Patient has poor historian on some information.  Is the patient at risk to self? Yes.    Has the patient been a risk to self in the past 6 months? Yes.    Has the patient been a risk to self within the distant past? Yes.    Is the patient a risk to others? No.  Has the patient been a risk to others in the past 6 months? No.  Has the patient been a risk to others within the distant past? No.   Prior Inpatient Therapy:   Prior Outpatient Therapy:    Alcohol Screening: 1. How often do you have a drink containing alcohol?: Never 2. How many drinks containing alcohol do you have on a typical day when you are drinking?: 1 or 2 3. How often do you have six or more drinks on one occasion?: Never AUDIT-C Score: 0 4. How often during the last year have you found that you were not able to stop drinking once you had started?: Never 5. How often during the last year have you failed to do what was normally expected from you becasue of drinking?: Never 6. How often during the last year have you needed a first drink in the morning to get yourself going after a heavy drinking session?: Never 7. How often during the last year have you had a feeling of guilt of remorse after drinking?: Never 8. How often during the last year have you been unable to remember what happened the night before because  you had been drinking?: Never 9. Have you or someone else been injured as a result of your drinking?: No 10. Has a relative or friend or a doctor or another health worker been concerned about your drinking or suggested you cut down?: No Alcohol Use Disorder Identification Test Final Score (AUDIT): 0 Intervention/Follow-up: AUDIT Score <7 follow-up not indicated Substance Abuse History in the last 12 months:  Yes.   Consequences of Substance Abuse: Medical Consequences:  reviewed Legal Consequences:  reviewed Family Consequences:  reviewed Previous Psychotropic Medications: Yes  Psychological Evaluations: Yes  Past Medical History:  Past Medical History:  Diagnosis Date  . Bipolar affective ([place])   . Depression   . Diabetes mellitus    uncontrolled   . Hypertension     Past Surgical History:  Procedure Laterality Date  . KNEE SURGERY     b/l knees; pending total knee    Family History:  Family History  Problem Relation Age of Onset  . Diabetes Unknown   . Cancer Mother        uterus; deceased   . Hypertension Unknown   . Heart attack Father        alive   . Stroke Sister        12/2012 age 80 y.o  Family Psychiatric  History: Patient does admit to a family history of mental illness and substance abuse but without specifics  Tobacco Screening: Have you used any form of tobacco in the last 30 days? (Cigarettes, Smokeless Tobacco, Cigars, and/or Pipes): No Social History:  Social History   Substance and Sexual Activity  Alcohol Use No     Social History   Substance and Sexual Activity  Drug Use Yes  . Types: Cocaine    Additional Social History:                           Allergies:   Allergies  Allergen Reactions  . Other Anaphylaxis    Allergy to GRITS , unknown exactly the specific ingredient he is allergic too   Lab Results:  Results for orders placed or performed during the hospital encounter of 07/30/17 (from the past 48 hour(s))  Glucose,  capillary     Status: Abnormal   Collection Time: 07/30/17  9:43 PM  Result Value Ref Range   Glucose-Capillary 197 (H) 65 - 99 mg/dL  Glucose, capillary     Status: Abnormal   Collection Time: 07/31/17  5:56 AM  Result Value Ref Range   Glucose-Capillary 203 (H) 65 - 99 mg/dL   Comment 1 Notify RN     Blood Alcohol level:  Lab Results  Component Value Date   ETH <10 07/29/2017   ETH <5 56/21/3086    Metabolic Disorder Labs:  Lab Results  Component Value Date   HGBA1C 14.0 (H) 12/31/2013   MPG 295 (H) 05/31/2013   MPG 341 (H) 05/15/2011   No results found for: PROLACTIN Lab Results  Component Value Date   CHOL  12/24/2009    167        ATP III CLASSIFICATION:  <200     mg/dL   Desirable  200-239  mg/dL   Borderline High  >=240    mg/dL   High          TRIG 49 12/24/2009   HDL 46 12/24/2009   CHOLHDL 3.6 12/24/2009   VLDL 10 12/24/2009   LDLCALC (H) 12/24/2009    111        Total Cholesterol/HDL:CHD Risk Coronary Heart Disease Risk Table                     Men   Women  1/2 Average Risk   3.4   3.3  Average Risk       5.0   4.4  2 X Average Risk   9.6   7.1  3 X Average Risk  23.4   11.0        Use the calculated Patient Ratio above and the CHD Risk Table to determine the patient's CHD Risk.        ATP III CLASSIFICATION (LDL):  <100     mg/dL   Optimal  100-129  mg/dL   Near or Above                    Optimal  130-159  mg/dL   Borderline  160-189  mg/dL   High  >190     mg/dL   Very High    Current Medications: Current Facility-Administered Medications  Medication Dose Route Frequency Provider Last Rate Last Dose  . buPROPion (WELLBUTRIN XL) 24 hr tablet 150 mg  150 mg Oral Daily Rankin, [name], NP      .  gabapentin (NEURONTIN) capsule 600 mg  600 mg Oral TID Rankin, [name], NP      . ibuprofen (ADVIL,MOTRIN) tablet 800 mg  800 mg Oral Q8H PRN [name], NP   800 mg at 07/31/17 0634  . insulin aspart (novoLOG) injection 0-9 Units  0-9  Units Subcutaneous TID [name], [name], NP   3 Units at 07/31/17 0625  . insulin glargine (LANTUS) injection 50 Units  50 Units Subcutaneous QHS [name], MD      . lisinopril (PRINIVIL,ZESTRIL) tablet 20 mg  20 mg Oral Daily Rankin, [name], NP      . traZODone (DESYREL) tablet 100 mg  100 mg Oral QHS PRN Liat Mayol, [name], [name]       PTA Medications: Medications Prior to Admission  Medication Sig Dispense Refill Last Dose  . buPROPion (WELLBUTRIN XL) 150 MG 24 hr tablet Take 150 mg by mouth daily.   Past Week at Unknown time  . gabapentin (NEURONTIN) 300 MG capsule Take 600 mg by mouth 3 (three) times daily.   Past Week at Unknown time  . insulin degludec (TRESIBA FLEXTOUCH) 100 UNIT/ML SOPN FlexTouch Pen Inject 40 Units into the skin at bedtime.   Past Week at Unknown time  . lisinopril (PRINIVIL,ZESTRIL) 20 MG tablet Take 20 mg by mouth daily.   Past Week at Unknown time  . metFORMIN (GLUCOPHAGE) 1000 MG tablet Take 1 tablet (1,000 mg total) by mouth 2 (two) times daily with a meal. For diabetes control (Patient not taking: Reported on 07/29/2017) 60 tablet 0 Not Taking at Unknown time  . methylPREDNISolone (MEDROL DOSEPAK) 4 MG TBPK tablet Taper over 6 days. (Patient not taking: Reported on 12/17/2016) 21 tablet 0 Completed Course at Unknown time    Musculoskeletal: Strength & Muscle Tone: within normal limits Gait & Station: normal Patient leans: N/A  Psychiatric Specialty Exam: Physical Exam  Nursing note and vitals reviewed. Constitutional: He is oriented to person, place, and time. He appears well-developed and well-nourished.  Cardiovascular: Normal rate.  Respiratory: Effort normal.  Musculoskeletal: Normal range of motion.  Uses a cane to ambulate, previous knee surgeries  Neurological: He is alert and oriented to person, place, and time.  Skin: Skin is warm.    Review of Systems  Constitutional: Negative.   HENT: Negative.   Eyes: Negative.   Respiratory:  Negative.   Cardiovascular: Negative.   Gastrointestinal: Negative.   Genitourinary: Negative.   Musculoskeletal: Positive for back pain, joint pain and myalgias.  Skin: Negative.   Neurological: Negative.   Endo/Heme/Allergies: Negative.   Psychiatric/Behavioral: Positive for depression, substance abuse and suicidal ideas. The patient is nervous/anxious and has insomnia.     Blood pressure 131/85, pulse 85, temperature (!) 97.5 F (36.4 C), temperature source Oral, resp. rate 16, height 5\' 6"  (1.676 m), weight 121.1 kg (267 lb).Body mass index is 43.09 kg/m.  General Appearance: Casual  Eye Contact:  Fair  Speech:  Clear and Coherent and Normal Rate  Volume:  Normal  Mood:  Depressed  Affect:  Depressed and Flat  Thought Process:  Goal Directed and Descriptions of Associations: Intact  Orientation:  Full (Time, Place, and Person)  Thought Content:  WDL  Suicidal Thoughts:  Yes.  without intent/plan  Homicidal Thoughts:  No  Memory:  Immediate;   Good Recent;   Good Remote;   Good  Judgement:  Fair  Insight:  Fair  Psychomotor Activity:  Normal  Concentration:  Concentration: Good and  Attention Span: Good  Recall:  Good  Fund of Knowledge:  Good  Language:  Good  Akathisia:  No  Handed:  Right  AIMS (if indicated):     Assets:  Communication Skills Desire for Improvement Financial Resources/Insurance Housing  ADL's:  Intact  Cognition:  WNL  Sleep:  Number of Hours: 6.75    Treatment Plan Summary: Daily contact with patient to assess and evaluate symptoms and progress in treatment, Medication management and Plan is to:  -See MAR and SRA for medication management -Encourage group therapy participation -[facility] will be contracted for current medication list  Observation Level/Precautions:  15 minute checks  Laboratory:  Reviewed  Psychotherapy: Group therapy  Medications: See [facility]  Consultations: As needed  Discharge Concerns: Relapse  Estimated LOS: 3-5 days   Other: Admit to [place] for Primary Diagnosis: MDD (major depressive disorder), recurrent episode, severe ([place]) Long Term Goal(s): Improvement in symptoms so as ready for discharge  Short Term Goals: Ability to disclose and discuss suicidal ideas, Ability to maintain clinical measurements within normal limits will improve, Compliance with prescribed medications will improve and Ability to identify triggers associated with substance abuse/mental health issues will improve  Physician Treatment Plan for Secondary Diagnosis: Principal Problem:   MDD (major depressive disorder), recurrent episode, severe ([place]) Active Problems:   Cocaine use disorder, moderate, dependence ([place])  Long Term Goal(s): Improvement in symptoms so as ready for discharge  Short Term Goals: Ability to identify changes in lifestyle to reduce recurrence of condition will improve, Ability to verbalize feelings will improve, Ability to demonstrate self-control will improve and Ability to identify and develop effective coping behaviors will improve  I certify that inpatient services furnished can reasonably be expected to improve the patient's condition.    Lewis Shock, [name] 6/6/201911:13 AM

## 2017-07-31 NOTE — Progress Notes (Signed)
Patient ID: [name], male   DOB: Mar 02, 1960, 57 y.o.   MRN: [id number]  Nursing Progress Note [id number]  Data: Patient presents calm, pleasant and cooperative. Patient complaint with scheduled medications. Patient requests to take his daily medications this evening. Provider made aware. Patient with complaints of arthirits and generalized pain. Patient completed self-inventory sheet and rates depression, hopelessness, and anxiety 0,0,0 respectively. Patient rates their sleep and appetite as poor/poor respectively. Patient states goal for today is to "trust God" and "pray". Patient is seen attending groups and visible in the milieu. Patient currently denies SI/HI/AVH.   Action: Patient educated about and provided medication per provider's orders. Patient safety maintained with q15 min safety checks and frequent rounding. High fall risk precautions in place. Emotional support given. 1:1 interaction and active listening provided. Patient encouraged to attend meals and groups. Patient encouraged to work on treatment plan and goals. Labs, vital signs and patient behavior monitored throughout shift.   Response: Patient agrees to come to staff if any thoughts of SI/HI develop or if patient develops intention of acting on thoughts. Patient remains safe on the unit at this time. Patient is interacting with peers appropriately on the unit. Will continue to support and monitor.

## 2017-07-31 NOTE — BHH Suicide Risk Assessment (Signed)
[facility] Admission Suicide Risk Assessment   Nursing information obtained from:  Patient Demographic factors:  Male, Low socioeconomic status, Living alone Current Mental Status:  Suicidal ideation indicated by patient, Self-harm thoughts Loss Factors:  Loss of significant relationship Historical Factors:  Family history of mental illness or substance abuse Risk Reduction Factors:  Positive coping skills or problem solving skills  Total Time spent with patient: 30 minutes Principal Problem: <principal problem not specified> Diagnosis:   Patient Active Problem List   Diagnosis Date Noted  . MDD (major depressive disorder), recurrent episode, severe ([place]) [F33.2] 07/30/2017  . Cocaine use disorder, moderate, dependence ([place]) [F14.20] 01/04/2015  . Severe recurrent major depression with psychotic features ([place]) [F33.3] 01/04/2015  . Depression, major, recurrent, severe with psychosis ([place]) [F33.3] 08/29/2013  . Suicidal ideations [R45.851] 08/28/2013  . Chest pain [R07.9] 06/30/2013  . HTN (hypertension) [I10] 06/30/2013  . Uncontrolled diabetes mellitus ([place]) [E11.65] 06/30/2013  . Bipolar affect, depressed ([place]) [F31.30] 05/13/2011   Subjective Data: Patient is seen and examined.  Patient is a 57 year old male with a reported past psychiatric history significant for depression as well as cocaine use disorder who presented to the [facility] emergency room yesterday with suicidal ideation.  The patient stated that his sister and father had been recently killed in a motor vehicle accident approximately 10 days ago.  He stated he had to bury his father last Thursday.  He stated he had lived in [place] for 16 years, and moved here approximately 3 to 4 months ago.  He stated he had been living independently in [place], and then moved here to help care for his elderly father.  He stated that he had recently relapsed on cocaine, and stated it was the first time in 16 years that he had done that.  He  stated that after the burial he was unable to return to his father's home.  [place] because he found it difficult to walk into the house.  He admitted to helplessness, hopelessness, worthlessness as well as suicidal ideation.  He was admitted to the hospital for evaluation and stabilization.  Continued Clinical Symptoms:  Alcohol Use Disorder Identification Test Final Score (AUDIT): 0 The "Alcohol Use Disorders Identification Test", Guidelines for Use in Primary Care, Second Edition.  World Pharmacologist [facility]). Score between 0-7:  no or low risk or alcohol related problems. Score between 8-15:  moderate risk of alcohol related problems. Score between 16-19:  high risk of alcohol related problems. Score 20 or above:  warrants further diagnostic evaluation for alcohol dependence and treatment.   CLINICAL FACTORS:   Depression:   Anhedonia Hopelessness Impulsivity Alcohol/Substance Abuse/Dependencies   Musculoskeletal: Strength & Muscle Tone: abnormal Gait & Station: broad based Patient leans: N/A  Psychiatric Specialty Exam: Physical Exam  Nursing note and vitals reviewed. Constitutional: He is oriented to person, place, and time. He appears well-developed and well-nourished.  HENT:  Head: Normocephalic and atraumatic.  Respiratory: Effort normal.  Neurological: He is alert and oriented to person, place, and time.    ROS  Blood pressure 131/85, pulse 85, temperature (!) 97.5 F (36.4 C), temperature source Oral, resp. rate 16, height 5\' 6"  (1.676 m), weight 121.1 kg (267 lb).Body mass index is 43.09 kg/m.  General Appearance: Casual  Eye Contact:  Fair  Speech:  Normal Rate  Volume:  Normal  Mood:  Dysphoric  Affect:  Congruent  Thought Process:  Coherent  Orientation:  Full (Time, Place, and Person)  Thought Content:  Logical  Suicidal Thoughts:  Yes.  without intent/plan  Homicidal Thoughts:  No  Memory:   Immediate;   Poor Recent;   Poor Remote;   Fair  Judgement:  Impaired  Insight:  Lacking  Psychomotor Activity:  Decreased  Concentration:  Concentration: Fair and Attention Span: Fair  Recall:  Poor  Fund of Knowledge:  Fair  Language:  Good  Akathisia:  Negative  Handed:  Right  AIMS (if indicated):     Assets:  Desire for Improvement  ADL's:  Intact  Cognition:  WNL  Sleep:  Number of Hours: 6.75      COGNITIVE FEATURES THAT CONTRIBUTE TO RISK:  None    SUICIDE RISK:   Minimal: No identifiable suicidal ideation.  Patients presenting with no risk factors but with morbid ruminations; may be classified as minimal risk based on the severity of the depressive symptoms  PLAN OF CARE: Patient is seen and examined.  Patient is a 57 year old male with the above-stated past psychiatric history who presented with suicidal ideation and was admitted to the hospital.  There are some inconsistencies with the story.  He stated he had not been hospitalized in several years, and review of the electronic medical record showed a hospitalization in [place] in January for cocaine related issues.  He also stated he had been sober for 16 years, and that was also found to be false.  We will have to confirm the deaths and funerals of his family members.  He will be admitted to the hospital.  He will be integrated into the milieu.  He will be urged to attend groups for coping skills.  We will attempt to deal with his grief issues.  I certify that inpatient services furnished can reasonably be expected to improve the patient's condition.   [name], MD 07/31/2017, 10:06 AM

## 2017-07-31 NOTE — Progress Notes (Signed)
Psychoeducational Group Note  Date:  07/31/2017 Time:  2100  Group Topic/Focus:  wrap up group  Participation Level: Did Not Attend  Participation Quality:  Not Applicable  Affect:  Not Applicable  Cognitive:  Not Applicable  Insight:  Not Applicable  Engagement in Group: Not Applicable  Additional Comments:  Pt was notified that group was beginning but remained in bed.   Shellia Cleverly 07/31/2017, 10:38 PM

## 2017-08-01 DIAGNOSIS — Z87891 Personal history of nicotine dependence: Secondary | ICD-10-CM

## 2017-08-01 DIAGNOSIS — F332 Major depressive disorder, recurrent severe without psychotic features: Principal | ICD-10-CM

## 2017-08-01 DIAGNOSIS — F1490 Cocaine use, unspecified, uncomplicated: Secondary | ICD-10-CM

## 2017-08-01 DIAGNOSIS — I10 Essential (primary) hypertension: Secondary | ICD-10-CM

## 2017-08-01 DIAGNOSIS — E1165 Type 2 diabetes mellitus with hyperglycemia: Secondary | ICD-10-CM

## 2017-08-01 DIAGNOSIS — N289 Disorder of kidney and ureter, unspecified: Secondary | ICD-10-CM

## 2017-08-01 LAB — GLUCOSE, CAPILLARY
Glucose-Capillary: 182 mg/dL — ABNORMAL HIGH (ref 65–99)
Glucose-Capillary: 252 mg/dL — ABNORMAL HIGH (ref 65–99)
Glucose-Capillary: 359 mg/dL — ABNORMAL HIGH (ref 65–99)
Glucose-Capillary: 255 mg/dL — ABNORMAL HIGH (ref 65–99)

## 2017-08-01 LAB — HEMOGLOBIN A1C
Hgb A1c MFr Bld: 12.6 % — ABNORMAL HIGH (ref 4.8–5.6)
Mean Plasma Glucose: 314.92 mg/dL

## 2017-08-01 MED ORDER — INSULIN GLARGINE 100 UNIT/ML ~~LOC~~ SOLN
60.0000 [IU] | Freq: Every day | SUBCUTANEOUS | Status: DC
  Administered 2017-08-01 – 2017-08-03 (×3): 60 [IU] via SUBCUTANEOUS

## 2017-08-01 MED ORDER — BUPROPION HCL ER (XL) 150 MG PO TB24
150.0000 mg | ORAL_TABLET | Freq: Every day | ORAL | Status: DC
  Administered 2017-08-01: 150 mg via ORAL
  Filled 2017-08-01 (×3): qty 1

## 2017-08-01 MED ORDER — INSULIN GLARGINE 100 UNIT/ML ~~LOC~~ SOLN: 50.0000 [IU] | Freq: Every day | SUBCUTANEOUS | Status: DC

## 2017-08-01 MED ORDER — CLONIDINE HCL 0.1 MG PO TABS
0.2000 mg | ORAL_TABLET | Freq: Three times a day (TID) | ORAL | Status: DC | PRN
  Administered 2017-08-02: 0.2 mg via ORAL
  Filled 2017-08-01: qty 2

## 2017-08-01 NOTE — Progress Notes (Signed)
Inpatient Diabetes Program Recommendations  AACE/ADA: New Consensus Statement on Inpatient Glycemic Control (2015)  Target Ranges:  Prepandial:   less than 140 mg/dL      Peak postprandial:   less than 180 mg/dL (1-2 hours)      Critically ill patients:  140 - 180 mg/dL   Lab Results  Component Value Date   GLUCAP 182 (H) 08/01/2017   HGBA1C 14.0 (H) 12/31/2013    Review of Glycemic Control  Diabetes history: DM2 Outpatient Diabetes medications: Tresiba 40 units QHS, metformin (no longer taking) Current orders for Inpatient glycemic control: Lantus 50 units Q1800, Novolog 0-9 units tidwc  Inpatient Diabetes Program Recommendations:     Please update HgbA1C - last one 4 years ago. Increase Novolog to 0-15 units tidwc and hs Decrease Lantus to 45 units Q1800 if FBS < 180 mg/dL.  Will continue to follow.  Thank you. Lorenda Peck, RD, LDN, CDE Inpatient Diabetes Coordinator [phone]

## 2017-08-01 NOTE — [facility] Group Notes (Addendum)
LCSW Group Therapy Note 08/01/2017 2:06 PM  Type of Therapy and Topic: Group Therapy: Avoiding Self-Sabotaging and Enabling Behaviors  Participation Level: Active  Description of Group:  In this group, patients will learn how to identify obstacles, self-sabotaging and enabling behaviors, as well as: what are they, why do we do them and what needs these behaviors meet. Discuss unhealthy relationships and how to have positive healthy boundaries with those that sabotage and enable. Explore aspects of self-sabotage and enabling in yourself and how to limit these self-destructive behaviors in everyday life.  Therapeutic Goals: 1. Patient will identify one obstacle that relates to self-sabotage and enabling behaviors 2. Patient will identify one personal self-sabotaging or enabling behavior they did prior to admission 3. Patient will state a plan to change the above identified behavior 4. Patient will demonstrate ability to communicate their needs through discussion and/or role play.   Summary of Patient Progress:  Shaheem was engaged and participated throughout the group's discussion. Lathyn states that his self sabotaging behavior is his arrogance.     Therapeutic Modalities:  Cognitive Behavioral Therapy Person-Centered Therapy Motivational Interviewing   [place] Clinical Social Worker

## 2017-08-01 NOTE — Tx Team (Signed)
Interdisciplinary Treatment and Diagnostic Plan Update  08/01/2017 Time of Session: [id number] [name] MRN: [id number]  Principal Diagnosis: MDD (major depressive disorder), recurrent episode, severe ([name])  Secondary Diagnoses: Principal Problem:   MDD (major depressive disorder), recurrent episode, severe ([name]) Active Problems:   Cocaine use disorder, moderate, dependence ([name])   Current Medications:  Current Facility-Administered Medications  Medication Dose Route Frequency Provider Last Rate Last Dose  . buPROPion (WELLBUTRIN XL) 24 hr tablet 150 mg  150 mg Oral q1800 [name], MD      . cloNIDine (CATAPRES) tablet 0.1 mg  0.1 mg Oral TID PRN [name], MD   0.1 mg at 08/01/17 0746  . gabapentin (NEURONTIN) capsule 600 mg  600 mg Oral TID Rankin, [name], NP   600 mg at 08/01/17 0746  . ibuprofen (ADVIL,MOTRIN) tablet 800 mg  800 mg Oral Q8H PRN [name], NP   800 mg at 07/31/17 0634  . insulin aspart (novoLOG) injection 0-9 Units  0-9 Units Subcutaneous TID [name], [name], NP   5 Units at 07/31/17 1703  . insulin glargine (LANTUS) injection 50 Units  50 Units Subcutaneous q1800 [name], MD      . lisinopril (PRINIVIL,ZESTRIL) tablet 20 mg  20 mg Oral Daily Rankin, [name], NP   20 mg at 07/31/17 1702  . traZODone (DESYREL) tablet 100 mg  100 mg Oral QHS PRN Money, [name], [name]       PTA Medications: Medications Prior to Admission  Medication Sig Dispense Refill Last Dose  . buPROPion (WELLBUTRIN XL) 150 MG 24 hr tablet Take 150 mg by mouth daily.   Past Week at Unknown time  . gabapentin (NEURONTIN) 300 MG capsule Take 600 mg by mouth 3 (three) times daily.   Past Week at Unknown time  . insulin degludec (TRESIBA FLEXTOUCH) 100 UNIT/ML SOPN FlexTouch Pen Inject 40 Units into the skin at bedtime.   Past Week at Unknown time  . lisinopril (PRINIVIL,ZESTRIL) 20 MG tablet Take 20 mg by mouth daily.   Past Week at Unknown time  . metFORMIN  (GLUCOPHAGE) 1000 MG tablet Take 1 tablet (1,000 mg total) by mouth 2 (two) times daily with a meal. For diabetes control (Patient not taking: Reported on 07/29/2017) 60 tablet 0 Not Taking at Unknown time  . methylPREDNISolone (MEDROL DOSEPAK) 4 MG TBPK tablet Taper over 6 days. (Patient not taking: Reported on 12/17/2016) 21 tablet 0 Completed Course at Unknown time    Patient Stressors: Health problems Loss of father and sister  Patient Strengths: Ability for insight Average or above average intelligence Capable of independent living Communication skills General fund of knowledge Motivation for treatment/growth  Treatment Modalities: Medication Management, Group therapy, Case management,  1 to 1 session with clinician, Psychoeducation, Recreational therapy.   Physician Treatment Plan for Primary Diagnosis: MDD (major depressive disorder), recurrent episode, severe ([name]) Long Term Goal(s): Improvement in symptoms so as ready for discharge Improvement in symptoms so as ready for discharge   Short Term Goals: Ability to disclose and discuss suicidal ideas Ability to maintain clinical measurements within normal limits will improve Compliance with prescribed medications will improve Ability to identify triggers associated with substance abuse/mental health issues will improve Ability to identify changes in lifestyle to reduce recurrence of condition will improve Ability to verbalize feelings will improve Ability to demonstrate self-control will improve Ability to identify and develop effective coping behaviors will improve  Medication Management: Evaluate patient's response,  side effects, and tolerance of medication regimen.  Therapeutic Interventions: 1 to 1 sessions, Unit Group sessions and Medication administration.  Evaluation of Outcomes: Progressing  Physician Treatment Plan for Secondary Diagnosis: Principal Problem:   MDD (major depressive disorder), recurrent episode,  severe ([name]) Active Problems:   Cocaine use disorder, moderate, dependence ([name])  Long Term Goal(s): Improvement in symptoms so as ready for discharge Improvement in symptoms so as ready for discharge   Short Term Goals: Ability to disclose and discuss suicidal ideas Ability to maintain clinical measurements within normal limits will improve Compliance with prescribed medications will improve Ability to identify triggers associated with substance abuse/mental health issues will improve Ability to identify changes in lifestyle to reduce recurrence of condition will improve Ability to verbalize feelings will improve Ability to demonstrate self-control will improve Ability to identify and develop effective coping behaviors will improve     Medication Management: Evaluate patient's response, side effects, and tolerance of medication regimen.  Therapeutic Interventions: 1 to 1 sessions, Unit Group sessions and Medication administration.  Evaluation of Outcomes: Progressing   RN Treatment Plan for Primary Diagnosis: MDD (major depressive disorder), recurrent episode, severe ([name]) Long Term Goal(s): Knowledge of disease and therapeutic regimen to maintain health will improve  Short Term Goals: Ability to remain free from injury will improve, Ability to disclose and discuss suicidal ideas and Ability to identify and develop effective coping behaviors will improve  Medication Management: RN will administer medications as ordered by provider, will assess and evaluate patient's response and provide education to patient for prescribed medication. RN will report any adverse and/or side effects to prescribing provider.  Therapeutic Interventions: 1 on 1 counseling sessions, Psychoeducation, Medication administration, Evaluate responses to treatment, Monitor vital signs and CBGs as ordered, Perform/monitor CIWA, COWS, AIMS and Fall Risk screenings as ordered, Perform wound care treatments as  ordered.  Evaluation of Outcomes: Progressing   LCSW Treatment Plan for Primary Diagnosis: MDD (major depressive disorder), recurrent episode, severe ([place]) Long Term Goal(s): Safe transition to appropriate next level of care at discharge, Engage patient in therapeutic group addressing interpersonal concerns.  Short Term Goals: Engage patient in aftercare planning with referrals and resources, Facilitate patient progression through stages of change regarding substance use diagnoses and concerns and Identify triggers associated with mental health/substance abuse issues  Therapeutic Interventions: Assess for all discharge needs, 1 to 1 time with Social worker, Explore available resources and support systems, Assess for adequacy in community support network, Educate family and significant other(s) on suicide prevention, Complete Psychosocial Assessment, Interpersonal group therapy.  Evaluation of Outcomes: Progressing   Progress in Treatment: Attending groups: Yes. Participating in groups: Yes. Taking medication as prescribed: Yes. Toleration medication: Yes. Family/Significant other contact made: SPE completed with pt; pt declined to consent to collateral contact.  Patient understands diagnosis: Yes. Discussing patient identified problems/goals with staff: Yes. Medical problems stabilized or resolved: Yes. Denies suicidal/homicidal ideation: Yes. Issues/concerns per patient self-inventory: No. Other: n/a   New problem(s) identified: No, Describe:  n/a  New Short Term/Long Term Goal(s): detox, medication management for mood stabilization; elimination of SI thoughts; development of comprehensive mental wellness/sobriety plan.   Patient Goals:  "I need help with my depression and grieving over the death of my father and sister."   Discharge Plan or Barriers: CSW assessing. Pt plans to return to [facility] for medication management and therapy. Pt provided with [facility]  for grief counseling as well. [place] pamphlet, Mobile Crisis information, and AA/NA information provided  to patient for additional community support and resources.   Reason for Continuation of Hospitalization: Anxiety Depression Medication stabilization Suicidal ideation Withdrawal symptoms  Estimated Length of Stay: Monday, 08/04/17  Attendees: Patient: [name]  08/01/2017 8:44 AM  Physician: Dr. Mallie Darting MD; Dr. Nancy Fetter MD 08/01/2017 8:44 AM  Nursing: Estill Bamberg RN; Danika RN 08/01/2017 8:44 AM  RN Care Manager:x 08/01/2017 8:44 AM  Social Worker: Janice Norrie LCSW 08/01/2017 8:44 AM  Recreational Therapist: x 08/01/2017 8:44 AM  Other: Lindell Spar NP 08/01/2017 8:44 AM  Other:  08/01/2017 8:44 AM  Other: 08/01/2017 8:44 AM    Scribe for Treatment Team: Avelina Laine, LCSW 08/01/2017 8:44 AM

## 2017-08-01 NOTE — Progress Notes (Signed)
Recreation Therapy Notes  Date: 6.7.19 Time: 0930 Location: 300 Hall Dayroom  Group Topic: Stress Management  Goal Area(s) Addresses:  Patient will verbalize importance of using healthy stress management.  Patient will identify positive emotions associated with healthy stress management.   Intervention: Stress Management  Activity :  LRT introduced the stress management technique of meditation.  LRT played that focused on being resilient in the face of struggle.  Patients were to follow along as the meditation played to engage in the activity.  Education:  Stress Management, Discharge Planning.   Education Outcome: Acknowledges edcuation/In group clarification offered/Needs additional education  Clinical Observations/Feedback: Pt did not attend group.    Victorino Sparrow, LRT/CTRS         Ria Comment, Kenzey Birkland A 08/01/2017 11:58 AM

## 2017-08-01 NOTE — Progress Notes (Signed)
Patient ID: [name], male   DOB: 03-12-1960, 57 y.o.   MRN: [id number]  Nursing Progress Note [id number]  Data: Patient presents with pleasant mood and is seen joking with peers/staff. Patient often sarcastic with Probation officer but is pleasant and respectful. Patient complaint with scheduled medications. Patient has made requests to the timing of his scheduled medications. Provider made aware. New orders received. Patient complains of chronic pain to his legs/back. Patient is ambulating with a cane per provider order. Patient completed self-inventory sheet and rates depression, hopelessness, and anxiety 10,8,7 respectively. Patient rates their sleep and appetite as poor/fair respectively. Patient states goal for today is to "hear something meaningful". Patient is seen attending groups and visible in the milieu. Patient currently denies SI/HI/AVH.   Action: Patient educated about and provided medication per provider's orders. Patient safety maintained with q15 min safety checks and frequent rounding. High fall risk precautions in place. Emotional support given. 1:1 interaction and active listening provided. Patient encouraged to attend meals and groups. Patient encouraged to work on treatment plan and goals. Labs, vital signs and patient behavior monitored throughout shift.   Response: Patient agrees to come to staff if any thoughts of SI/HI develop or if patient develops intention of acting on thoughts. Patient remains safe on the unit at this time. Patient is interacting with peers appropriately on the unit. Will continue to support and monitor.

## 2017-08-01 NOTE — Progress Notes (Signed)
[facility] MD Progress Note  08/01/2017 2:50 PM [name]  MRN:  [id number] Subjective: Patient is seen and examined.  Patient is a 57 year old male with a past psychiatric history significant for major depression as well as cocaine disorder.  He seen in follow-up.  He stated he is doing well.  He still complaining of pain.  He stated he is discussed his aftercare plan with social work, and there apparently several entities in which she can be involved with therapy.  We discussed whether or not he was can stay in the [place] area return to [place], and he stated that he would probably stay here for now.  He stated his ex-wife was living here currently.  He denied any suicidal ideation.  His blood pressure continues to be elevated, and his blood sugar continues to be elevated.  His creatinine on admission was 1.25.  His blood sugar on admission was 517, his glucose this morning was 252 despite 40 units of Lantus insulin at at bedtime last night. Principal Problem: MDD (major depressive disorder), recurrent episode, severe ([place]) Diagnosis:   Patient Active Problem List   Diagnosis Date Noted  . MDD (major depressive disorder), recurrent episode, severe ([place]) [F33.2] 07/30/2017  . Cocaine use disorder, moderate, dependence ([place]) [F14.20] 01/04/2015  . Severe recurrent major depression with psychotic features ([place]) [F33.3] 01/04/2015  . Depression, major, recurrent, severe with psychosis ([place]) [F33.3] 08/29/2013  . Suicidal ideations [R45.851] 08/28/2013  . Chest pain [R07.9] 06/30/2013  . HTN (hypertension) [I10] 06/30/2013  . Uncontrolled diabetes mellitus ([place]) [E11.65] 06/30/2013  . Bipolar affect, depressed ([place]) [F31.30] 05/13/2011   Total Time spent with patient: 20 minutes  Past Psychiatric History: See admission H&P  Past Medical History:  Past Medical History:  Diagnosis Date  . Bipolar affective ([place])   . Depression   . Diabetes mellitus    uncontrolled   . Hypertension      Past Surgical History:  Procedure Laterality Date  . KNEE SURGERY     b/l knees; pending total knee    Family History:  Family History  Problem Relation Age of Onset  . Diabetes Unknown   . Cancer Mother        uterus; deceased   . Hypertension Unknown   . Heart attack Father        alive   . Stroke Sister        12/2012 age 64 y.o   Family Psychiatric  History: See admission H&P Social History:  Social History   Substance and Sexual Activity  Alcohol Use No     Social History   Substance and Sexual Activity  Drug Use Yes  . Types: Cocaine    Social History   Socioeconomic History  . Marital status: Married    Spouse name: Not on file  . Number of children: Not on file  . Years of education: Not on file  . Highest education level: Not on file  Occupational History  . Not on file  Social Needs  . Financial resource strain: Not on file  . Food insecurity:    Worry: Not on file    Inability: Not on file  . Transportation needs:    Medical: Not on file    Non-medical: Not on file  Tobacco Use  . Smoking status: Former Research scientist (life sciences)  . Smokeless tobacco: Never Used  Substance and Sexual Activity  . Alcohol use: No  . Drug use: Yes    Types: Cocaine  . Sexual  activity: Yes    Birth control/protection: Condom  Lifestyle  . Physical activity:    Days per week: Not on file    Minutes per session: Not on file  . Stress: Not on file  Relationships  . Social connections:    Talks on phone: Not on file    Gets together: Not on file    Attends religious service: Not on file    Active member of club or organization: Not on file    Attends meetings of clubs or organizations: Not on file    Relationship status: Not on file  Other Topics Concern  . 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 buPROPion (WELLBUTRIN XL) 24 hr tablet 150 mg  150 mg Oral q1800 [name], MD      . cloNIDine (CATAPRES) tablet 0.2 mg  0.2 mg Oral TID PRN [name], MD      . gabapentin (NEURONTIN) capsule 600 mg  600 mg Oral TID Rankin, [name], NP   600 mg at 08/01/17 1146  . ibuprofen (ADVIL,MOTRIN) tablet 800 mg  800 mg Oral Q8H PRN [name], NP   800 mg at 08/01/17 1146  . insulin aspart (novoLOG) injection 0-9 Units  0-9 Units Subcutaneous TID [name], [name], NP   5 Units at 08/01/17 1300  . insulin glargine (LANTUS) injection 60 Units  60 Units Subcutaneous q1800 [name], MD      . lisinopril (PRINIVIL,ZESTRIL) tablet 20 mg  20 mg Oral Daily Rankin, [name], NP   20 mg at 07/31/17 1702  . traZODone (DESYREL) tablet 100 mg  100 mg Oral QHS PRN Money, [name], [name]        Lab Results:  Results for orders placed or performed during the hospital encounter of 07/30/17 (from the past 48 hour(s))  Glucose, capillary     Status: Abnormal   Collection Time: 07/30/17  9:43 PM  Result Value Ref Range   Glucose-Capillary 197 (H) 65 - 99 mg/dL  Glucose, capillary     Status: Abnormal   Collection Time: 07/31/17  5:56 AM  Result Value Ref Range   Glucose-Capillary 203 (H) 65 - 99 mg/dL   Comment 1 Notify RN   Glucose, capillary     Status: Abnormal   Collection Time: 07/31/17 11:59 AM  Result Value Ref Range   Glucose-Capillary 197 (H) 65 - 99 mg/dL   Comment 1 Notify RN   Glucose, capillary     Status: Abnormal   Collection Time: 07/31/17  4:54 PM  Result Value Ref Range   Glucose-Capillary 278 (H) 65 - 99 mg/dL  Glucose, capillary     Status: Abnormal   Collection  Time: 07/31/17  8:56 PM  Result Value Ref Range   Glucose-Capillary 285 (H) 65 -  99 mg/dL   Comment 1 Notify RN    Comment 2 Document in Chart   Glucose, capillary     Status: Abnormal   Collection Time: 08/01/17  6:32 AM  Result Value Ref Range   Glucose-Capillary 182 (H) 65 - 99 mg/dL  Glucose, capillary     Status: Abnormal   Collection Time: 08/01/17 12:01 PM  Result Value Ref Range   Glucose-Capillary 252 (H) 65 - 99 mg/dL    Blood Alcohol level:  Lab Results  Component Value Date   ETH <10 07/29/2017   ETH <5 02/27/7251    Metabolic Disorder Labs: Lab Results  Component Value Date   HGBA1C 14.0 (H) 12/31/2013   MPG 295 (H) 05/31/2013   MPG 341 (H) 05/15/2011   No results found for: PROLACTIN Lab Results  Component Value Date   CHOL  12/24/2009    167        ATP III CLASSIFICATION:  <200     mg/dL   Desirable  200-239  mg/dL   Borderline High  >=240    mg/dL   High          TRIG 49 12/24/2009   HDL 46 12/24/2009   CHOLHDL 3.6 12/24/2009   VLDL 10 12/24/2009   LDLCALC (H) 12/24/2009    111        Total Cholesterol/HDL:CHD Risk Coronary Heart Disease Risk Table                     Men   Women  1/2 Average Risk   3.4   3.3  Average Risk       5.0   4.4  2 X Average Risk   9.6   7.1  3 X Average Risk  23.4   11.0        Use the calculated Patient Ratio above and the CHD Risk Table to determine the patient's CHD Risk.        ATP III CLASSIFICATION (LDL):  <100     mg/dL   Optimal  100-129  mg/dL   Near or Above                    Optimal  130-159  mg/dL   Borderline  160-189  mg/dL   High  >190     mg/dL   Very High    Physical Findings: AIMS: Facial and Oral Movements Muscles of Facial Expression: None, normal Lips and Perioral Area: None, normal Jaw: None, normal Tongue: None, normal,Extremity Movements Upper (arms, wrists, hands, fingers): None, normal Lower (legs, knees, ankles, toes): None, normal, Trunk Movements Neck, shoulders, hips:  None, normal, Overall Severity Severity of abnormal movements (highest score from questions above): None, normal Incapacitation due to abnormal movements: None, normal Patient's awareness of abnormal movements (rate only patient's report): No Awareness, Dental Status Current problems with teeth and/or dentures?: Yes Does patient usually wear dentures?: No  CIWA:    COWS:     Musculoskeletal: Strength & Muscle Tone: decreased Gait & Station: broad based Patient leans: N/A  Psychiatric Specialty Exam: Physical Exam  Nursing note and vitals reviewed. Constitutional: He is oriented to person, place, and time. He appears well-developed and well-nourished.  HENT:  Head: Normocephalic and atraumatic.  Respiratory: Effort normal.  Neurological: He is alert and oriented to person, place, and time.    ROS  Blood pressure (!) 142/89, pulse 71, temperature (!) 97.5 F (36.4 C), temperature source Oral, resp. rate  18, height 5\' 6"  (1.676 m), weight 121.1 kg (267 lb).Body mass index is 43.09 kg/m.  General Appearance: Casual  Eye Contact:  Fair  Speech:  Normal Rate  Volume:  Normal  Mood:  Euthymic  Affect:  Congruent  Thought Process:  Coherent  Orientation:  Full (Time, Place, and Person)  Thought Content:  Logical  Suicidal Thoughts:  No  Homicidal Thoughts:  No  Memory:  Immediate;   Fair Recent;   Fair Remote;   Fair  Judgement:  Intact  Insight:  Lacking  Psychomotor Activity:  Normal  Concentration:  Concentration: Fair and Attention Span: Fair  Recall:  [employer] of Knowledge:  Fair  Language:  Fair  Akathisia:  Negative  Handed:  Right  AIMS (if indicated):     Assets:  Desire for Improvement  ADL's:  Intact  Cognition:  WNL  Sleep:  Number of Hours: 6.5     Treatment Plan Summary: Daily contact with patient to assess and evaluate symptoms and progress in treatment, Medication management and Plan Patient is seen and examined.  Patient is a 57 year old male with  the above-stated past psychiatric history is seen in follow-up.  He is basically doing fine.  No change in his psychiatric medications.  His blood sugar remains elevated, and I am can increase his Lantus insulin to 60 units subcu nightly.,  And I am also going to increase his clonidine to 0.2 mg p.o. 3 times daily.  He does have some renal insufficiency and is on 20 mg of lisinopril.  We will get his kidney function checked on Sunday.  Otherwise no other changes in his treatment at this time.  We will target his discharge date on 6/10.  [name], MD 08/01/2017, 2:50 PM

## 2017-08-01 NOTE — Progress Notes (Signed)
Patient ID: [name], male   DOB: Feb 11, 1961, 57 y.o.   MRN: [id number]   D: Patient pleasant on approach tonight. Reports mood unchanged. Still feeling depressed. No active SI at present but contracts on the unit. Reported his interaction with a staff member earlier was not helpful to him but did not go into specifics. Just wanting his Lantus at bedtime. A: Staff will continue to monitor him on q 15 minute checks, follow treatment plan, and give medications per physician order. R: Cooperative on the unit.

## 2017-08-01 NOTE — Progress Notes (Signed)
Per pt request, referrals faxed to [facility]. Pt also has appt scheduled at [facility] for next week.   Lisbet Busker S. Ouida Sills, MSW, LCSW Clinical Social Worker 08/01/2017 3:24 PM

## 2017-08-01 NOTE — Progress Notes (Signed)
D.  Pt pleasant on approach, denies complaints at this time.  Pt remained in bed until blood sugar check was done, then did attend the last 15 minutes of group.  Pt observed engaged in appropriate interaction with peers on the unit.  Pt denies SI/HI/AVH at this time. A.  Support and encouragement offered, Pt states he doesn't want to take anything for sleep as he "doesn't want to take many medications".   R.  Pt remains safe on the unit, will continue to monitor.

## 2017-08-01 NOTE — Progress Notes (Signed)
The patient attended approximately 15 minutes of the group and then returned to his room to rest.

## 2017-08-02 LAB — BASIC METABOLIC PANEL
Anion gap: 8 (ref 5–15)
BUN: 14 mg/dL (ref 6–20)
CO2: 27 mmol/L (ref 22–32)
Calcium: 8.5 mg/dL — ABNORMAL LOW (ref 8.9–10.3)
Chloride: 99 mmol/L — ABNORMAL LOW (ref 101–111)
Creatinine, Ser: 0.93 mg/dL (ref 0.61–1.24)
GFR calc Af Amer: >60 mL/min (ref >60)
GFR calc non Af Amer: >60 mL/min (ref >60)
Glucose, Bld: 356 mg/dL — ABNORMAL HIGH (ref 65–99)
Potassium: 3.9 mmol/L (ref 3.5–5.1)
Sodium: 134 mmol/L — ABNORMAL LOW (ref 135–145)

## 2017-08-02 LAB — GLUCOSE, CAPILLARY
Glucose-Capillary: 151 mg/dL — ABNORMAL HIGH (ref 65–99)
Glucose-Capillary: 233 mg/dL — ABNORMAL HIGH (ref 65–99)
Glucose-Capillary: 416 mg/dL — ABNORMAL HIGH (ref 65–99)
Glucose-Capillary: 407 mg/dL — ABNORMAL HIGH (ref 65–99)
Glucose-Capillary: 367 mg/dL — ABNORMAL HIGH (ref 65–99)
Glucose-Capillary: 268 mg/dL — ABNORMAL HIGH (ref 65–99)

## 2017-08-02 MED ORDER — INSULIN ASPART 100 UNIT/ML ~~LOC~~ SOLN
10.0000 [IU] | Freq: Once | SUBCUTANEOUS | Status: AC
  Administered 2017-08-02: 10 [IU] via SUBCUTANEOUS

## 2017-08-02 MED ORDER — BUPROPION HCL ER (XL) 300 MG PO TB24
300.0000 mg | ORAL_TABLET | Freq: Every day | ORAL | Status: DC
  Administered 2017-08-02 – 2017-08-03 (×2): 300 mg via ORAL
  Filled 2017-08-02 (×5): qty 1

## 2017-08-02 MED ORDER — INSULIN ASPART 100 UNIT/ML ~~LOC~~ SOLN
6.0000 [IU] | Freq: Once | SUBCUTANEOUS | Status: AC
  Administered 2017-08-02: 6 [IU] via SUBCUTANEOUS

## 2017-08-02 NOTE — BHH Group Notes (Signed)
LCSW Group Therapy Note  08/02/2017   10:00--11:00am   Type of Therapy and Topic:  Group Therapy: Anger Cues and Responses  Participation Level:  Active   Description of Group:   In this group, patients learned how to recognize the physical, cognitive, emotional, and behavioral responses they have to anger-provoking situations.  They identified a recent time they became angry and how they reacted.  They analyzed how their reaction was possibly beneficial and how it was possibly unhelpful.  The group discussed a variety of healthier coping skills that could help with such a situation in the future.  Several grounding techniques were practiced briefly.  Therapeutic Goals: 1. Patients will remember their last incident of anger and how they felt emotionally and physically, what their thoughts were at the time, and how they behaved. 2. Patients will identify how their behavior at that time worked for them, as well as how it worked against them. 3. Patients will explore possible new behaviors to use in future anger situations. 4. Patients will learn that anger itself is normal and cannot be eliminated, and that healthier reactions can assist with resolving conflict rather than worsening situations.  Summary of Patient Progress:  The patient shared that his most recent time of anger was "now, yesterday, and the day before and every day" and said he is "angry at God," partly because he buried his father and his sister last Thursday.  He was very disgruntled with the doctor and said he has not gotten help.  CSW asked him to identify what type of help he feels he needs, and he said he needs "someone to talk to."  Arrangements were made for CSW to meet with him 1:1 later in the day to give him the opportunity to talk about his anger issues and losses.  Therapeutic Modalities:   Cognitive Behavioral Therapy  Maretta Los

## 2017-08-02 NOTE — BHH Counselor (Signed)
3:30-4:35pm  Clinical Social Work Note  At pt request, spent time 1:1 counseling patient and allowing him to process the recent deaths of his father and sister.  He had some limited insight and calmed down as he spoke.  He indicated a willingness to continue talking about it to his peers and at discharge with an aftercare counselor.  CSW recommended to him that he seek grief-specific therapy.    Selmer Dominion, LCSW 08/02/2017, 4:59 PM

## 2017-08-02 NOTE — Progress Notes (Signed)
[facility] MD Progress Note  08/02/2017 2:18 PM SHLOIME KEILMAN  MRN:  [id number] Subjective: I haven't given it a thought as to what I want to work on. I feel the same way as I did when I came in. Im still stuck, my depression is an 8/10.    Objective:  Patient is seen and examined.  Patient is a 57 year old male with a past psychiatric history significant for major depression as well as cocaine disorder.  He seen in follow-up. Patient is not engaging well and does not appear vested in treatment at this time. He states the groups are the same and they are reparative and they " all say the same thing. Nothing  I didn't know. This is like being in any other treatment facility. " He is open to seeing an outpatient  Provider. " My problems are mental and I just need to sit down and talk to someone."  He continues to endorse psychsomatic. He continues to ruminate about the services he is receiving and his dislike for the physician on the unit.  Despite multiple prompts he is unable to establish a goal, he is unable to identify services or resources he will need upon discharge. He is very vague and minimally responsive with most answers, and answers all questions with I dont know and I need to get back inside that house. "Discussed with patient trauma focused therapy is performed in outpatient, however he can receive grief and loss therapy while in the hospital and is part of his treatment here.  He is very circumstantial and grudgingly cooperative with most of his responses and remains irritable and difficult to assess as he is not willing to participate.  He reports having poor sleeping due to hospital mattress, and his back pain.  When discussing his blood sugars for today he states "I been a diabetic for 30 years since 1989 "   he stated he is discussed his aftercare plan with social work, and there apparently several entities in which she can be involved with therapy.   He denied any suicidal ideation.  Patient will  need extensive resources upon discharge, however at this time do not feel as inpatient is going to be beneficial for patient.  Principal Problem: MDD (major depressive disorder), recurrent episode, severe ([place]) Diagnosis:   Patient Active Problem List   Diagnosis Date Noted  . MDD (major depressive disorder), recurrent episode, severe ([place]) [F33.2] 07/30/2017  . Cocaine use disorder, moderate, dependence ([place]) [F14.20] 01/04/2015  . Severe recurrent major depression with psychotic features ([place]) [F33.3] 01/04/2015  . Depression, major, recurrent, severe with psychosis ([place]) [F33.3] 08/29/2013  . Suicidal ideations [R45.851] 08/28/2013  . Chest pain [R07.9] 06/30/2013  . HTN (hypertension) [I10] 06/30/2013  . Uncontrolled diabetes mellitus ([place]) [E11.65] 06/30/2013  . Bipolar affect, depressed ([place]) [F31.30] 05/13/2011   Total Time spent with patient: 20 minutes  Past Psychiatric History: See admission H&P  Past Medical History:  Past Medical History:  Diagnosis Date  . Bipolar affective ([place])   . Depression   . Diabetes mellitus    uncontrolled   . Hypertension     Past Surgical History:  Procedure Laterality Date  . KNEE SURGERY     b/l knees; pending total knee    Family History:  Family History  Problem Relation Age of Onset  . Diabetes Unknown   . Cancer Mother        uterus; deceased   . Hypertension Unknown   . Heart attack  Father        alive   . Stroke Sister        12/2012 age 26 y.o   Family Psychiatric  History: See admission H&P Social History:  Social History   Substance and Sexual Activity  Alcohol Use No     Social History   Substance and Sexual Activity  Drug Use Yes  . Types: Cocaine    Social History   Socioeconomic History  . Marital status: Married    Spouse name: Not on file  . Number of children: Not on file  . Years of education: Not on file  . Highest education level: Not on file  Occupational History  . Not on file  Social  Needs  . Financial resource strain: Not on file  . Food insecurity:    Worry: Not on file    Inability: Not on file  . Transportation needs:    Medical: Not on file    Non-medical: Not on file  Tobacco Use  . Smoking status: Former Research scientist (life sciences)  . Smokeless tobacco: Never Used  Substance and Sexual Activity  . Alcohol use: No  . Drug use: Yes    Types: Cocaine  . Sexual activity: Yes    Birth control/protection: Condom  Lifestyle  . Physical activity:    Days per week: Not on file    Minutes per session: Not on file  . Stress: Not on file  Relationships  . Social connections:    Talks on phone: Not on file    Gets together: Not on file    Attends religious service: Not on file    Active member of club or organization: Not on file    Attends meetings of clubs or organizations: Not on file    Relationship status: Not on file  Other Topics Concern  . 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 buPROPion (WELLBUTRIN XL) 24 hr tablet 150 mg  150 mg Oral q1800 [name], MD   150 mg at 08/01/17 1721  . cloNIDine (CATAPRES) tablet 0.2 mg  0.2 mg Oral TID PRN [name], MD   0.2 mg at 08/02/17 1057  . gabapentin (NEURONTIN) capsule 600 mg  600 mg Oral TID Rankin, [name], NP   600 mg at 08/02/17 1135  . ibuprofen (ADVIL,MOTRIN) tablet 800 mg  800 mg Oral Q8H PRN [name], NP   800 mg at 08/02/17 0845  . insulin aspart (novoLOG) injection 0-9 Units  0-9 Units Subcutaneous TID [name], [name], NP    9 Units at 08/01/17 1722  . insulin glargine (LANTUS) injection 60 Units  60 Units Subcutaneous q1800 [name], MD   60 Units at 08/01/17 1722  . lisinopril (PRINIVIL,ZESTRIL) tablet 20 mg  20 mg Oral Daily Rankin, [name],  NP   20 mg at 08/01/17 1721  . traZODone (DESYREL) tablet 100 mg  100 mg Oral QHS PRN Money, [name], [name]        Lab Results:  Results for orders placed or performed during the hospital encounter of 07/30/17 (from the past 48 hour(s))  Glucose, capillary     Status: Abnormal   Collection Time: 07/31/17  4:54 PM  Result Value Ref Range   Glucose-Capillary 278 (H) 65 - 99 mg/dL  Glucose, capillary     Status: Abnormal   Collection Time: 07/31/17  8:56 PM  Result Value Ref Range   Glucose-Capillary 285 (H) 65 - 99 mg/dL   Comment 1 Notify RN    Comment 2 Document in Chart   Glucose, capillary     Status: Abnormal   Collection Time: 08/01/17  6:32 AM  Result Value Ref Range   Glucose-Capillary 182 (H) 65 - 99 mg/dL  Glucose, capillary     Status: Abnormal   Collection Time: 08/01/17 12:01 PM  Result Value Ref Range   Glucose-Capillary 252 (H) 65 - 99 mg/dL  Glucose, capillary     Status: Abnormal   Collection Time: 08/01/17  5:02 PM  Result Value Ref Range   Glucose-Capillary 359 (H) 65 - 99 mg/dL   Comment 1 Notify RN    Comment 2 Document in Chart   Hemoglobin A1c     Status: Abnormal   Collection Time: 08/01/17  6:16 PM  Result Value Ref Range   Hgb A1c MFr Bld 12.6 (H) 4.8 - 5.6 %    Comment: (NOTE) Pre diabetes:          5.7%-6.4% Diabetes:              >6.4% Glycemic control for   <7.0% adults with diabetes    Mean Plasma Glucose 314.92 mg/dL    Comment: Performed at [facility], [address], [place], [place] [address]  Glucose, capillary     Status: Abnormal   Collection Time: 08/01/17  9:44 PM  Result Value Ref Range   Glucose-Capillary 255 (H) 65 - 99 mg/dL  Glucose, capillary     Status: Abnormal   Collection Time:  08/02/17  6:12 AM  Result Value Ref Range   Glucose-Capillary 151 (H) 65 - 99 mg/dL   Comment 1 Notify RN    Comment 2 Document in Chart   Glucose, capillary     Status: Abnormal   Collection Time: 08/02/17 11:30 AM  Result Value Ref Range   Glucose-Capillary 233 (H) 65 - 99 mg/dL    Blood Alcohol level:  Lab Results  Component Value Date   ETH <10 07/29/2017   ETH <5 37/16/9678    Metabolic Disorder Labs: Lab Results  Component Value Date   HGBA1C 12.6 (H) 08/01/2017   MPG 314.92 08/01/2017   MPG 295 (H) 05/31/2013   No results found for: PROLACTIN Lab Results  Component Value Date   CHOL  12/24/2009    167        ATP III CLASSIFICATION:  <200     mg/dL   Desirable  200-239  mg/dL   Borderline High  >=240    mg/dL   High          TRIG 49 12/24/2009   HDL 46 12/24/2009   CHOLHDL 3.6 12/24/2009   VLDL 10 12/24/2009   LDLCALC (H) 12/24/2009    111        Total Cholesterol/HDL:CHD  Risk Coronary Heart Disease Risk Table                     Men   Women  1/2 Average Risk   3.4   3.3  Average Risk       5.0   4.4  2 X Average Risk   9.6   7.1  3 X Average Risk  23.4   11.0        Use the calculated Patient Ratio above and the CHD Risk Table to determine the patient's CHD Risk.        ATP III CLASSIFICATION (LDL):  <100     mg/dL   Optimal  100-129  mg/dL   Near or Above                    Optimal  130-159  mg/dL   Borderline  160-189  mg/dL   High  >190     mg/dL   Very High    Physical Findings: AIMS: Facial and Oral Movements Muscles of Facial Expression: None, normal Lips and Perioral Area: None, normal Jaw: None, normal Tongue: None, normal,Extremity Movements Upper (arms, wrists, hands, fingers): None, normal Lower (legs, knees, ankles, toes): None, normal, Trunk Movements Neck, shoulders, hips: None, normal, Overall Severity Severity of abnormal movements (highest score from questions above): None, normal Incapacitation due to abnormal movements:  None, normal Patient's awareness of abnormal movements (rate only patient's report): No Awareness, Dental Status Current problems with teeth and/or dentures?: Yes Does patient usually wear dentures?: No  CIWA:    COWS:     Musculoskeletal: Strength & Muscle Tone: decreased Gait & Station: broad based Patient leans: N/A  Psychiatric Specialty Exam: Physical Exam  Nursing note and vitals reviewed. Constitutional: He is oriented to person, place, and time. He appears well-developed and well-nourished.  HENT:  Head: Normocephalic and atraumatic.  Respiratory: Effort normal.  Neurological: He is alert and oriented to person, place, and time.    ROS   Blood pressure (!) 153/87, pulse 74, temperature 97.6 F (36.4 C), temperature source Oral, resp. rate 18, height 5\' 6"  (1.676 m), weight 121.1 kg (267 lb).Body mass index is 43.09 kg/m.  General Appearance: Casual  Eye Contact:  Fair  Speech:  Normal Rate  Volume:  Normal  Mood:  Depressed  Affect:  Blunt and Congruent  Thought Process:  Coherent and Descriptions of Associations: Circumstantial  Orientation:  Full (Time, Place, and Person)  Thought Content:  Logical  Suicidal Thoughts:  No  Homicidal Thoughts:  No  Memory:  Immediate;   Fair Recent;   Fair Remote;   Fair  Judgement:  Intact  Insight:  Lacking  Psychomotor Activity:  Normal  Concentration:  Concentration: Fair and Attention Span: Fair  Recall:  [employer] of Knowledge:  Fair  Language:  Fair  Akathisia:  Negative  Handed:  Right  AIMS (if indicated):     Assets:  Desire for Improvement  ADL's:  Intact  Cognition:  WNL  Sleep:  Number of Hours: 6.25     Treatment Plan Summary: Daily contact with patient to assess and evaluate symptoms and progress in treatment, Medication management and Plan Patient is seen and examined.  Patient is a 57 year old male with the above-stated past psychiatric history is seen in follow-up.  He is basically doing fine.  No  change in his psychiatric medications.  His blood sugar remains elevated, and I am can increase his  Lantus insulin to 60 units subcu nightly.,  And I am also going to increase his clonidine to 0.2 mg p.o. 3 times daily.  He does have some renal insufficiency and is on 20 mg of lisinopril.  We will get his kidney function checked on Sunday.  Otherwise no other changes in his treatment at this time.  We will target his discharge date on 6/10.  Nanci Pina, [name] 08/02/2017, 2:18 PM

## 2017-08-02 NOTE — BHH Group Notes (Signed)
Adult Psychoeducational Group Note  Date:  08/02/2017 Time:  9:00 AM  Group Topic/Focus: Orientation/Goals Group Orientation:   The focus of this group is to educate the patient on the purpose and policies of crisis stabilization and provide a format to answer questions about their admission.  The group details unit policies and expectations of patients while admitted. Goals Group:   The focus of this group is to help patients establish daily goals to achieve during treatment and discuss how the patient can incorporate goal setting into their daily lives to aide in recovery.   Participation Level:  Active  Participation Quality:  Appropriate, Attentive, Sharing and Supportive  Affect:  Depressed and Flat  Cognitive:  Alert and Oriented  Insight: Improving  Engagement in Group:  Developing/Improving  Modes of Intervention:  Discussion, Education, Orientation, Socialization and Support  Additional Comments:  Patient attended the entire group. He participated and was respectful to peers. Patient reports his goals are to talk to somebody 1:1.   Theone Stanley Donivan Thammavong 08/02/2017, 9:45 AM

## 2017-08-02 NOTE — Progress Notes (Signed)
[facility] Group Notes:  (Nursing/MHT/Case Management/Adjunct)  Date:  08/02/2017  Time:  2045   Type of Therapy:  wrap up group  Participation Level:  Active  Participation Quality:  Appropriate, Attentive, Sharing and Supportive  Affect:  Appropriate  Cognitive:  Appropriate  Insight:  Improving  Engagement in Group:  Engaged  Modes of Intervention:  Clarification, Education and Support  Summary of Progress/Problems:  Shellia Cleverly 08/02/2017, 9:59 PM

## 2017-08-02 NOTE — Progress Notes (Addendum)
Patient ID: [name], male   DOB: 1960-09-09, 56 y.o.   MRN: [id number]  Patient CBG 282 at 1130. Per sliding scale, patient ordered to have 3 units. Patient refused insulin stating, "anything less than 5 units is a waste of my time". Patient encouraged to take medication and was educated about benefits of taking insulin. Will continue to monitor.

## 2017-08-02 NOTE — Progress Notes (Signed)
Patient ID: [name], male   DOB: 02-12-1961, 57 y.o.   MRN: [id number]  Nursing Progress Note [id number]  Data: Patient presents pleasant and cooperative. Patient complaint with scheduled medications. Patient ambulating with his cane per provider order. Patient is observed up interactive with peers in the dayroom but reports to writer, "I feel like I'm treading water. I don't feel any better than when I first came in". Patient reports he would like 1:1 therapy with a provider and is encouraged to speak to SW about out-patient follow up care. Patient completed self-inventory sheet and rates depression, hopelessness, and anxiety 9,8,0 respectively. Patient rates their sleep and appetite as fair/fair respectively. Patient had not identified a goal for today.  Patient is seen attending groups and visible in the milieu. Patient currently denies SI/HI/AVH.   Action: Patient educated about and provided medication per provider's orders. Patient safety maintained with q15 min safety checks and frequent rounding. High fall risk precautions in place. Emotional support given. 1:1 interaction and active listening provided. Patient encouraged to attend meals and groups. Patient encouraged to work on treatment plan and goals. Labs, vital signs and patient behavior monitored throughout shift.   Response: Patient agrees to come to staff if any thoughts of SI/HI develop or if patient develops intention of acting on thoughts. Patient remains safe on the unit at this time. Patient is interacting with peers appropriately on the unit. Will continue to support and monitor.

## 2017-08-02 NOTE — Progress Notes (Signed)
D.  Pt pleasant on approach, denies complaints at this time.  Pt was positive for evening wrap up group, observed interacting minimally with peers in dayroom.  Pt requested insulin tonight as his blood sugar was 268, PA notified and reviewed chart.  PA ordered a one time dose of 6 units of Novolog.  Pt wants to speak to doctor about how he takes his insulin at home.  Pt denies SI/HI/AVH at this time.  A.  Support and encouragement offered, medication given as ordered.  R.  Pt remains safe on the unit, will continue to monitor.

## 2017-08-02 NOTE — Progress Notes (Addendum)
Patient ID: [name], male   DOB: 1961/02/21, 57 y.o.   MRN: [id number]  CRITICAL VALUE ALERT  Critical Value: CBG 416  Date & Time Notied: 08/02/17 1720  Provider Notified: Dr. Mallie Darting, MD  Orders Received/Actions taken: See new orders. Recheck sugar q 30 min for 1 hour. Notify physician of patient status in one hour.

## 2017-08-02 NOTE — Progress Notes (Signed)
Patient ID: [name], male   DOB: 1960-08-08, 57 y.o.   MRN: [id number]  Patient CBG has trended down to 367. MD notified and verbal orders received. Will provide Latnus 60 units as scheduled. No further short acting insulin ordered at this time to prevent hypoglycemia throughout the night. Educated patient about s/s of hypoglycemia. Will continue to support and monitor.

## 2017-08-03 DIAGNOSIS — Z9114 Patient's other noncompliance with medication regimen: Secondary | ICD-10-CM

## 2017-08-03 DIAGNOSIS — F1410 Cocaine abuse, uncomplicated: Secondary | ICD-10-CM

## 2017-08-03 LAB — GLUCOSE, CAPILLARY
Glucose-Capillary: 152 mg/dL — ABNORMAL HIGH (ref 65–99)
Glucose-Capillary: 183 mg/dL — ABNORMAL HIGH (ref 65–99)
Glucose-Capillary: 308 mg/dL — ABNORMAL HIGH (ref 65–99)
Glucose-Capillary: 221 mg/dL — ABNORMAL HIGH (ref 65–99)

## 2017-08-03 MED ORDER — GABAPENTIN 300 MG PO CAPS: 600.0000 mg | ORAL_CAPSULE | Freq: Three times a day (TID) | ORAL | 0 refills | Status: DC

## 2017-08-03 MED ORDER — ASPIRIN-ACETAMINOPHEN-CAFFEINE 250-250-65 MG PO TABS
1.0000 | ORAL_TABLET | Freq: Three times a day (TID) | ORAL | Status: DC | PRN
  Administered 2017-08-03 – 2017-08-04 (×2): 1 via ORAL
  Filled 2017-08-03 (×3): qty 1

## 2017-08-03 MED ORDER — BUPROPION HCL ER (XL) 300 MG PO TB24: 300.0000 mg | ORAL_TABLET | Freq: Every day | ORAL | 0 refills | Status: DC

## 2017-08-03 MED ORDER — TRAZODONE HCL 100 MG PO TABS: 100.0000 mg | ORAL_TABLET | Freq: Every evening | ORAL | 0 refills | Status: DC | PRN

## 2017-08-03 NOTE — Progress Notes (Addendum)
Patient self inventory- Patient slept fair last night, sleep medication was not requested. Appetite has been fair, energy level normal, concentration good. Depression is rated 6, hopelessness 4, and anxiety 10 out of 10. Patient denies withdrawal symptoms.Patient claims physical pain, but cannot rate. Pain is in "my body." Patient's goal for the day is "not thinking too much." He said he will accomplish his goal by "letting it happen." Denies SI/HI/AVH. Patient is compliant with medication administration prescribed per physician. Safety maintained with 15 minute checks. Will continue to monitor.

## 2017-08-03 NOTE — Progress Notes (Signed)
Patient ID: [name], male   DOB: 22-Jan-1961, 57 y.o.   MRN: [id number] Per State regulations 482.30 this chart was reviewed for medical necessity with respect to the patient's admission/duration of stay.    Next review date: 08/07/17  Debarah Crape, BSN, RN-BC  Case Manager

## 2017-08-03 NOTE — Progress Notes (Signed)
[facility] MD Progress Note  08/03/2017 11:16 AM CHAD DONOGHUE  MRN:  [id number]   Evaluation: [name] is awake alert and oriented x3.  Seen sitting in day room watching television.  Denies suicidal or homicidal ideations during this assessment.  Rates his depression a 3 out of 10 with 10 being the worst.  States this is a difficult time of the month for me due to my father's passing.  Reports unexplained "blah emotions" and   decreased energy.  Patient reports taking medications as prescribed and tolerating them well.  States he has plans to follow-up with a Education officer, museum on tomorrow to see where he is going to discharge to.  States he does not have anywhere to go.  Continues to have poor insight with medications i.e. diabetes treatment/management.  Reports a fair appetite.  States he is resting fairly throughout the night.  Support encouragement reassurance was provided.   History:per assessment note-Mort 57 y.o.malewho presents voluntarily to [place] symptoms of depression and suicidal ideation. Pt has a history ofdepression and suicidal ideation. Pt denies current suicidal ideationbut denies having a plan. Pt reports 3 past attemptsthat he can remember. Pt acknowledges symptoms including: sadness, fatigue, guilt, low self esteem, tearfulness, isolating, lack of motivation, anger, irritability, negative outlook, difficulty concentrating, helplessness, hopelessness, sleepingless, eating less, excessive worry and intrusive thoughts. Pt denieshomicidal ideation/ history of violence. Ptreportsauditory hallucinations, and denies visual hallucinationsor other psychotic symptoms. Pt states current stressors include the recent death of his father and sister.  Ptreports he was living with his father, but since his death he has been unable to go inside the house. Pt denies having anysupports. Pt denies history of abuse and trauma. Pt reports there is a family history ofSA. Ptis disabled and unemployed.  Pt haspoorinsight andimpairedjudgment. Pt's memory isintact.Pt denies legal history     Principal Problem: MDD (major depressive disorder), recurrent episode, severe ([place]) Diagnosis:   Patient Active Problem List   Diagnosis Date Noted  . MDD (major depressive disorder), recurrent episode, severe ([place]) [F33.2] 07/30/2017  . Cocaine use disorder, moderate, dependence ([place]) [F14.20] 01/04/2015  . Severe recurrent major depression with psychotic features ([place]) [F33.3] 01/04/2015  . Depression, major, recurrent, severe with psychosis ([place]) [F33.3] 08/29/2013  . Suicidal ideations [R45.851] 08/28/2013  . Chest pain [R07.9] 06/30/2013  . HTN (hypertension) [I10] 06/30/2013  . Uncontrolled diabetes mellitus ([place]) [E11.65] 06/30/2013  . Bipolar affect, depressed ([place]) [F31.30] 05/13/2011   Total Time spent with patient: 20 minutes  Past Psychiatric History: See admission H&P  Past Medical History:  Past Medical History:  Diagnosis Date  . Bipolar affective ([place])   . Depression   . Diabetes mellitus    uncontrolled   . Hypertension     Past Surgical History:  Procedure Laterality Date  . KNEE SURGERY     b/l knees; pending total knee    Family History:  Family History  Problem Relation Age of Onset  . Diabetes Unknown   . Cancer Mother        uterus; deceased   . Hypertension Unknown   . Heart attack Father        alive   . Stroke Sister        12/2012 age 60 y.o   Family Psychiatric  History: See admission H&P Social History:  Social History   Substance and Sexual Activity  Alcohol Use No     Social History   Substance and Sexual Activity  Drug Use Yes  . Types: Cocaine  Social History   Socioeconomic History  . Marital status: Married    Spouse name: Not on file  . Number of children: Not on file  . Years of education: Not on file  . Highest education level: Not on file  Occupational History  . Not on file  Social Needs  . Financial resource  strain: Not on file  . Food insecurity:    Worry: Not on file    Inability: Not on file  . Transportation needs:    Medical: Not on file    Non-medical: Not on file  Tobacco Use  . Smoking status: Former Research scientist (life sciences)  . Smokeless tobacco: Never Used  Substance and Sexual Activity  . Alcohol use: No  . Drug use: Yes    Types: Cocaine  . Sexual activity: Yes    Birth control/protection: Condom  Lifestyle  . Physical activity:    Days per week: Not on file    Minutes per session: Not on file  . Stress: Not on file  Relationships  . Social connections:    Talks on phone: Not on file    Gets together: Not on file    Attends religious service: Not on file    Active member of club or organization: Not on file    Attends meetings of clubs or organizations: Not on file    Relationship status: Not on file  Other Topics Concern  . 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 buPROPion (WELLBUTRIN XL) 24 hr tablet 300 mg  300 mg Oral q1800 Nanci Pina, [name]   300 mg at 08/02/17 1720  . cloNIDine (CATAPRES) tablet 0.2 mg  0.2 mg Oral TID PRN [name], MD   0.2 mg at 08/02/17 1057  . gabapentin (NEURONTIN) capsule 600 mg  600 mg Oral TID Rankin, [name], NP   600 mg at 08/03/17 0833  . ibuprofen (ADVIL,MOTRIN) tablet 800 mg  800 mg Oral Q8H PRN [name], NP   800 mg at 08/03/17 0836  . insulin aspart (novoLOG) injection 0-9 Units  0-9 Units Subcutaneous TID [name], [name], NP   9 Units at 08/01/17 1722  .  insulin glargine (LANTUS) injection 60 Units  60 Units Subcutaneous q1800 [name], MD   60 Units at 08/02/17 1850  . lisinopril (PRINIVIL,ZESTRIL) tablet 20 mg  20 mg Oral Daily Rankin, [name], NP   20 mg at 08/02/17 1720  . traZODone (DESYREL) tablet 100 mg  100 mg Oral QHS PRN Money, [name], [name]        Lab Results:  Results for orders placed or performed during the hospital encounter of 07/30/17 (from the past 48 hour(s))  Glucose, capillary     Status: Abnormal   Collection Time: 08/01/17 12:01 PM  Result Value Ref  Range   Glucose-Capillary 252 (H) 65 - 99 mg/dL  Glucose, capillary     Status: Abnormal   Collection Time: 08/01/17  5:02 PM  Result Value Ref Range   Glucose-Capillary 359 (H) 65 - 99 mg/dL   Comment 1 Notify RN    Comment 2 Document in Chart   Hemoglobin A1c     Status: Abnormal   Collection Time: 08/01/17  6:16 PM  Result Value Ref Range   Hgb A1c MFr Bld 12.6 (H) 4.8 - 5.6 %    Comment: (NOTE) Pre diabetes:          5.7%-6.4% Diabetes:              >6.4% Glycemic control for   <7.0% adults with diabetes    Mean Plasma Glucose 314.92 mg/dL    Comment: Performed at [facility] [address], [place], [place] [address]  Glucose, capillary     Status: Abnormal   Collection Time: 08/01/17  9:44 PM  Result Value Ref Range   Glucose-Capillary 255 (H) 65 - 99 mg/dL  Glucose, capillary     Status: Abnormal   Collection Time: 08/02/17  6:12 AM  Result Value Ref Range   Glucose-Capillary 151 (H) 65 - 99 mg/dL   Comment 1 Notify RN    Comment 2 Document in Chart   Glucose, capillary     Status: Abnormal   Collection Time: 08/02/17 11:30 AM  Result Value Ref Range   Glucose-Capillary 233 (H) 65 - 99 mg/dL  Glucose, capillary     Status: Abnormal   Collection Time: 08/02/17  4:38 PM  Result Value Ref Range   Glucose-Capillary 416 (H) 65 - 99 mg/dL  Glucose, capillary     Status: Abnormal   Collection Time: 08/02/17  5:33 PM  Result Value  Ref Range   Glucose-Capillary 407 (H) 65 - 99 mg/dL  Basic metabolic panel     Status: Abnormal   Collection Time: 08/02/17  6:30 PM  Result Value Ref Range   Sodium 134 (L) 135 - 145 mmol/L   Potassium 3.9 3.5 - 5.1 mmol/L   Chloride 99 (L) 101 - 111 mmol/L   CO2 27 22 - 32 mmol/L   Glucose, Bld 356 (H) 65 - 99 mg/dL   BUN 14 6 - 20 mg/dL   Creatinine, Ser 0.93 0.61 - 1.24 mg/dL   Calcium 8.5 (L) 8.9 - 10.3 mg/dL   GFR calc non Af Amer >60 >60 mL/min   GFR calc Af Amer >60 >60 mL/min    Comment: (NOTE) The eGFR has been calculated using the CKD EPI equation. This calculation has not been validated in all clinical situations. eGFR's persistently <60 mL/min signify possible Chronic Kidney Disease.    Anion gap 8 5 - 15    Comment: Performed at [facility], [place] [address], [place], [place] [address]  Glucose, capillary     Status: Abnormal   Collection Time: 08/02/17  6:41 PM  Result Value Ref Range   Glucose-Capillary 367 (H) 65 - 99 mg/dL  Glucose, capillary     Status: Abnormal   Collection Time: 08/02/17  9:11 PM  Result Value Ref Range   Glucose-Capillary 268 (H) 65 - 99 mg/dL   Comment 1 Notify RN    Comment 2 Document in Chart   Glucose, capillary     Status: Abnormal   Collection Time: 08/03/17  5:52 AM  Result Value Ref Range   Glucose-Capillary  152 (H) 65 - 99 mg/dL   Comment 1 Notify RN    Comment 2 Document in Chart     Blood Alcohol level:  Lab Results  Component Value Date   ETH <10 07/29/2017   ETH <5 70/96/2836    Metabolic Disorder Labs: Lab Results  Component Value Date   HGBA1C 12.6 (H) 08/01/2017   MPG 314.92 08/01/2017   MPG 295 (H) 05/31/2013   No results found for: PROLACTIN Lab Results  Component Value Date   CHOL  12/24/2009    167        ATP III CLASSIFICATION:  <200     mg/dL   Desirable  200-239  mg/dL   Borderline High  >=240    mg/dL   High          TRIG 49 12/24/2009   HDL 46 12/24/2009   CHOLHDL 3.6  12/24/2009   VLDL 10 12/24/2009   LDLCALC (H) 12/24/2009    111        Total Cholesterol/HDL:CHD Risk Coronary Heart Disease Risk Table                     Men   Women  1/2 Average Risk   3.4   3.3  Average Risk       5.0   4.4  2 X Average Risk   9.6   7.1  3 X Average Risk  23.4   11.0        Use the calculated Patient Ratio above and the CHD Risk Table to determine the patient's CHD Risk.        ATP III CLASSIFICATION (LDL):  <100     mg/dL   Optimal  100-129  mg/dL   Near or Above                    Optimal  130-159  mg/dL   Borderline  160-189  mg/dL   High  >190     mg/dL   Very High    Physical Findings: AIMS: Facial and Oral Movements Muscles of Facial Expression: None, normal Lips and Perioral Area: None, normal Jaw: None, normal Tongue: None, normal,Extremity Movements Upper (arms, wrists, hands, fingers): None, normal Lower (legs, knees, ankles, toes): None, normal, Trunk Movements Neck, shoulders, hips: None, normal, Overall Severity Severity of abnormal movements (highest score from questions above): None, normal Incapacitation due to abnormal movements: None, normal Patient's awareness of abnormal movements (rate only patient's report): No Awareness, Dental Status Current problems with teeth and/or dentures?: Yes Does patient usually wear dentures?: No  CIWA:    COWS:     Musculoskeletal: Strength & Muscle Tone: decreased Gait & Station: broad based using can for ambulation assistance  Patient leans: N/A  Psychiatric Specialty Exam: Physical Exam  Nursing note and vitals reviewed. Constitutional: He is oriented to person, place, and time. He appears well-developed and well-nourished.  HENT:  Head: Normocephalic and atraumatic.  Respiratory: Effort normal.  Neurological: He is alert and oriented to person, place, and time.    Review of Systems  Psychiatric/Behavioral: Positive for depression and substance abuse. Negative for suicidal ideas. The  patient is nervous/anxious. The patient does not have insomnia.   All other systems reviewed and are negative.   Blood pressure (!) 139/99, pulse 77, temperature 97.6 F (36.4 C), temperature source Oral, resp. rate 18, height 5' 6"  (1.676 m), weight 121.1 kg (267 lb).Body mass index is 43.09 kg/m.  General  Appearance: Casual  Eye Contact:  Fair  Speech:  Normal Rate  Volume:  Normal  Mood:  Depressed  Affect:  Blunt and Congruent  Thought Process:  Coherent and Descriptions of Associations: Circumstantial  Orientation:  Full (Time, Place, and Person)  Thought Content:  Logical  Suicidal Thoughts:  No  Homicidal Thoughts:  No  Memory:  Immediate;   Fair Recent;   Fair Remote;   Fair  Judgement:  Intact  Insight:  Lacking  Psychomotor Activity:  Normal  Concentration:  Concentration: Fair and Attention Span: Fair  Recall:  [employer] of Knowledge:  Fair  Language:  Fair  Akathisia:  Negative  Handed:  Right  AIMS (if indicated):     Assets:  Desire for Improvement  ADL's:  Intact  Cognition:  WNL  Sleep:  Number of Hours: 3.75     Treatment Plan Summary: Daily contact with patient to assess and evaluate symptoms and progress in treatment and Medication management   Continue with current medications on 08/03/2017 as listed below expect where noted   Will continue to monitor vitals ,medication compliance and treatment side effects while patient is here.   Continue with Neurontin 600 mg and Wellbutrin 300 mg  for mood stabilization. Continue with Trazodone 100 mg for insomnia   Reviewed labs Glucose- daily - 152 slight elevation A1C .12 ,BAL - , UDS - positive  for cocaine.  Patient could benefit from diabetes teaching.  Currently refusing education.   Will continue to monitor vitals ,medication compliance and treatment side effects while patient is here.   CSW will continue  working on disposition.  Patient to participate in therapeutic milieu  Derrill Center,  NP 08/03/2017, 11:16 AM

## 2017-08-03 NOTE — Plan of Care (Signed)
  Problem: Education: Goal: Emotional status will improve Outcome: Progressing   Problem: Education: Goal: Mental status will improve Outcome: Progressing   Problem: Activity: Goal: Interest or engagement in activities will improve Outcome: Progressing   Problem: Coping: Goal: Ability to verbalize frustrations and anger appropriately will improve Outcome: Progressing

## 2017-08-03 NOTE — [facility] Group Notes (Signed)
[place] LCSW Group Therapy Note  08/03/2017  10:00-11:00AM  Type of Therapy and Topic:  Group Therapy:  Being Your Own Support  Participation Level:  Active   Description of Group:  Patients in this group were introduced to the concept that self-support is an essential part of recovery.  A song entitled "My Own Hero" was played and a group discussion ensued in which patients stated they could relate to the song and it inspired them to realize they have be willing to help themselves in order to succeed, because other people cannot achieve sobriety or stability for them.  We discussed adding a variety of healthy supports to address the various needs in their lives.  A song was played called "I Know Where I've Been" toward the end of group and used to conduct an inspirational wrap-up to group of remembering how far they have already come in their journey.  Therapeutic Goals: 1)  demonstrate the importance of being a part of one's own support system 2)  discuss reasons people in one's life may eventually be unable to be continually supportive  3)  identify the patient's current support system and   4)  elicit commitments to add healthy supports and to become more conscious of being self-supportive   Summary of Patient Progress:  The patient expressed that he feels that he is alone in the world right now, since he just lost his sister and father.  He acknowledged that he will need to enlist additional supports.  He said he felt very inspired by the songs, and was encouraging throughout the group to other people.  He joked around a lot with CSW and the other group members and was generally very involved.   Therapeutic Modalities:   Motivational Interviewing Activity  Maretta Los

## 2017-08-03 NOTE — Discharge Summary (Signed)
Physician Discharge Summary Note  Patient:  [name] is an 57 y.o., male MRN:  [id number] DOB:  08-31-1960 Patient phone:  [phone] (home)  Patient address:   [address] [place] [address],  Total Time spent with patient: 45 minutes  Date of Admission:  07/30/2017 Date of Discharge: 08/04/2017  Reason for Admission: Suicidal Ideation   Principal Problem: MDD (major depressive disorder), recurrent episode, severe [facility]) Discharge Diagnoses: Patient Active Problem List   Diagnosis Date Noted  . MDD (major depressive disorder), recurrent episode, severe ([place]) [F33.2] 07/30/2017    Priority: High  . Cocaine use disorder, moderate, dependence ([place]) [F14.20] 01/04/2015    Priority: High  . Suicidal ideations [R45.851] 08/28/2013    Priority: High  . Severe recurrent major depression with psychotic features ([place]) [F33.3] 01/04/2015  . Depression, major, recurrent, severe with psychosis ([place]) [F33.3] 08/29/2013  . Chest pain [R07.9] 06/30/2013  . HTN (hypertension) [I10] 06/30/2013  . Uncontrolled diabetes mellitus ([place]) [E11.65] 06/30/2013  . Bipolar affect, depressed ([place]) [F31.30] 05/13/2011    Past Psychiatric History: depression, substance abuse  Past Medical History:  Past Medical History:  Diagnosis Date  . Bipolar affective ([place])   . Depression   . Diabetes mellitus    uncontrolled   . Hypertension     Past Surgical History:  Procedure Laterality Date  . KNEE SURGERY     b/l knees; pending total knee    Family History:  Family History  Problem Relation Age of Onset  . Diabetes Unknown   . Cancer Mother        uterus; deceased   . Hypertension Unknown   . Heart attack Father        alive   . Stroke Sister        12/2012 age 23 y.o   Family Psychiatric  History: none Social History:  Social History   Substance and Sexual Activity  Alcohol Use No     Social History   Substance and Sexual Activity  Drug Use Yes  . Types: Cocaine    Social  History   Socioeconomic History  . Marital status: Married    Spouse name: Not on file  . Number of children: Not on file  . Years of education: Not on file  . Highest education level: Not on file  Occupational History  . Not on file  Social Needs  . Financial resource strain: Not on file  . Food insecurity:    Worry: Not on file    Inability: Not on file  . Transportation needs:    Medical: Not on file    Non-medical: Not on file  Tobacco Use  . Smoking status: Former Research scientist (life sciences)  . Smokeless tobacco: Never Used  Substance and Sexual Activity  . Alcohol use: No  . Drug use: Yes    Types: Cocaine  . Sexual activity: Yes    Birth control/protection: Condom  Lifestyle  . Physical activity:    Days per week: Not on file    Minutes per session: Not on file  . Stress: Not on file  Relationships  . Social connections:    Talks on phone: Not on file    Gets together: Not on file    Attends religious service: Not on file    Active member of club or organization: Not on file    Attends meetings of clubs or organizations: Not on file    Relationship status: Not on file  Other Topics Concern  .  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  Patient is a 57 year old male with a reported past psychiatric history significant for depression as well as cocaine use disorder who presented to the [facility] emergency room yesterday with suicidal ideation.  The patient stated that his sister and father had been recently killed in a motor vehicle accident approximately 10 days ago.  He stated he had to bury his father last Thursday.  He stated he had lived in [place] for 16 years,  and moved here approximately 3 to 4 months ago.  He stated he had been living independently in [place], and then moved here to help care for his elderly father.  He stated that he had recently relapsed on cocaine, and stated it was the first time in 16 years that he had done that.  He stated that after the burial he was unable to return to his father's home.  He stated [place] because he found it difficult to walk into the house.  He admitted to helplessness, hopelessness, worthlessness as well as suicidal ideation.  He was admitted to the hospital for evaluation and stabilization.   Medication: Wellbutrin XL 150 mg daily, Gabapentin 600 mg TID, Trazodone 100 mg Q HS prn  08/01/2017: Patient is seen and examined.  Patient is a 57 year old male with a past psychiatric history significant for major depression as well as cocaine disorder.  He seen in follow-up.  He stated he is doing well.  He still complaining of pain.  He stated he is discussed his aftercare plan with social work, and there apparently several entities in which she can be involved with therapy.  We discussed whether or not he was can stay in the [place] area return to [place], and he stated that he would probably stay here for now.  He stated his ex-wife was living here currently.  He denied any suicidal ideation.  His blood pressure continues to be elevated, and his blood sugar continues to be elevated.  His creatinine on admission was 1.25.  His blood sugar on admission was 517, his glucose this morning was 252 despite 40 units of Lantus insulin at at bedtime last night.  Medication: no change  08/02/2017: Patient is seen and examined.  Patient is a 57 year old male with a past psychiatric history significant for major depression as well as cocaine disorder.  He seen in follow-up. Patient is not engaging well and does not appear vested in treatment at this time. He states the groups are the  same and they are reparative and they " all say the same thing. Nothing  I didn't know. This is like being in any other treatment facility. " He is open to seeing an outpatient  Provider. " My problems are mental and I just need to sit down and talk to someone."  He continues to endorse psychsomatic. He continues to ruminate about the services he is receiving and his dislike for the physician on the unit.  Despite multiple prompts he  is unable to establish a goal, he is unable to identify services or resources he will need upon discharge. He is very vague and minimally responsive with most answers, and answers all questions with I dont know and I need to get back inside that house. "Discussed with patient trauma focused therapy is performed in outpatient, however he can receive grief and loss therapy while in the hospital and is part of his treatment here.  He is very circumstantial and grudgingly cooperative with most of his responses and remains irritable and difficult to assess as he is not willing to participate.  He reports having poor sleeping due to hospital mattress, and his back pain.  When discussing his blood sugars for today he states "I been a diabetic for 30 years since 1989 "   he stated he is discussed his aftercare plan with social work, and there apparently several entities in which she can be involved with therapy.   He denied any suicidal ideation.  Patient will need extensive resources upon discharge, however at this time do not feel as inpatient is going to be beneficial for patient.  Medication: No change  08/03/2017: [name] is awake alert and oriented x3.  Seen sitting in day room watching television.  Denies suicidal or homicidal ideations during this assessment.  Rates his depression a 3 out of 10 with 10 being the worst.  States this is a difficult time of the month for me due to my father's passing.  Reports unexplained "blah emotions" and   decreased energy.  Patient reports taking  medications as prescribed and tolerating them well.  States he has plans to follow-up with a Education officer, museum on tomorrow to see where he is going to discharge to.  States he does not have anywhere to go.  Continues to have poor insight with medications i.e. diabetes treatment/management.  Reports a fair appetite.  States he is resting fairly throughout the night.  Support encouragement reassurance was provided.   Medication: No change  08/04/2017: Patient has met maximum benefits from hospitalization.  Denied suicidal/homicidal ideations, hallucinations or withdrawal symptoms.  Discharge instructions were provided with explanations, with 24- hour crisis numbers, follow-up appointment and prescriptions.  Stable for discharge.     Physical Findings: AIMS: Facial and Oral Movements Muscles of Facial Expression: None, normal Lips and Perioral Area: None, normal Jaw: None, normal Tongue: None, normal,Extremity Movements Upper (arms, wrists, hands, fingers): None, normal Lower (legs, knees, ankles, toes): None, normal, Trunk Movements Neck, shoulders, hips: None, normal, Overall Severity Severity of abnormal movements (highest score from questions above): None, normal Incapacitation due to abnormal movements: None, normal Patient's awareness of abnormal movements (rate only patient's report): No Awareness, Dental Status Current problems with teeth and/or dentures?: Yes Does patient usually wear dentures?: No  CIWA:    COWS:     Musculoskeletal: Strength & Muscle Tone: decreased Gait & Station: broad based Patient leans: N/A  Psychiatric Specialty Exam: Review of Systems  Musculoskeletal: Positive for back pain, joint pain and myalgias.  All other systems reviewed and are negative.   Blood pressure (!) 172/107, pulse 77, temperature 97.6 F (36.4 C), temperature source Oral, resp. rate 18, height 5' 6"  (1.676 m), weight 121.1 kg (267 lb).Body mass index is 43.09 kg/m.  General  Appearance: Casual  Eye Contact::  Fair  Speech:  Normal [id number]  Volume:  Normal  Mood:  Euthymic  Affect:  Appropriate  Thought Process:  Coherent  Orientation:  Full (Time, Place, and Person)  Thought Content:  Logical  Suicidal Thoughts:  No  Homicidal Thoughts:  No  Memory:  Immediate;   Fair Recent;   Fair Remote;   Fair  Judgement:  Intact  Insight:  Lacking  Psychomotor Activity:  Normal  Concentration:  Fair  Recall:  Bell of Knowledge:Fair  Language: Fair  Akathisia:  Negative  Handed:  Right  AIMS (if indicated):     Assets:  Desire for Improvement Resilience  Sleep:  Number of Hours: 5.25  Cognition: WNL  ADL's:  Intact    Have you used any form of tobacco in the last 30 days? (Cigarettes, Smokeless Tobacco, Cigars, and/or Pipes): No  Has this patient used any form of tobacco in the last 30 days? (Cigarettes, Smokeless Tobacco, Cigars, and/or Pipes) Yes, Yes, A prescription for an FDA-approved tobacco cessation medication was offered at discharge and the patient refused  Blood Alcohol level:  Lab Results  Component Value Date   [facility] <10 07/29/2017   ETH <5 07/37/1062    Metabolic Disorder Labs:  Lab Results  Component Value Date   HGBA1C 12.6 (H) 08/01/2017   MPG 314.92 08/01/2017   MPG 295 (H) 05/31/2013   No results found for: PROLACTIN Lab Results  Component Value Date   CHOL  12/24/2009    167        ATP III CLASSIFICATION:  <200     mg/dL   Desirable  200-239  mg/dL   Borderline High  >=240    mg/dL   High          TRIG 49 12/24/2009   HDL 46 12/24/2009   CHOLHDL 3.6 12/24/2009   VLDL 10 12/24/2009   LDLCALC (H) 12/24/2009    111        Total Cholesterol/HDL:CHD Risk Coronary Heart Disease Risk Table                     Men   Women  1/2 Average Risk   3.4   3.3  Average Risk       5.0   4.4  2 X Average Risk   9.6   7.1  3 X Average Risk  23.4   11.0        Use the calculated Patient Ratio above and the CHD Risk Table to  determine the patient's CHD Risk.        ATP III CLASSIFICATION (LDL):  <100     mg/dL   Optimal  100-129  mg/dL   Near or Above                    Optimal  130-159  mg/dL   Borderline  160-189  mg/dL   High  >190     mg/dL   Very High    See Psychiatric Specialty Exam and Suicide Risk Assessment completed by Attending Physician prior to discharge.  Discharge destination:  Home  Is patient on multiple antipsychotic therapies at discharge:  No   Has Patient had three or more failed trials of antipsychotic monotherapy by history:  No  Recommended Plan for Multiple Antipsychotic Therapies: NA  Discharge Instructions    Diet - low sodium heart healthy   Complete by:  As directed    Discharge instructions   Complete by:  As directed    Follow-up with outpatient appointment   Increase activity slowly   Complete by:  As directed       Follow-up Information  [facility] Go on 08/08/2017.   Specialty:  Family Medicine Why:  Appointment is Friday, 08/08/17 at 10:15am with Dr. Eula Fried. Please be sure to bring any discharge paperwork from the hospital.  Contact information: [address] [place] [address] [phone]        [facility].   Why:  Referral faxed 08/01/17. Please call admissions [phone] to check status of referral and waitlist if you are still interested in placement after discharge. Thank you.  Contact information: [address] [place] [address] [phone]        [facility] Follow up.   Why:  Referral faxed: 08/01/17. Please call admissions office at [phone] to check status of referral and waitlist if still interested in placement after discharge. Thank you.  Contact information: [place] [address] Phone: [phone] Fax: [phone]          Follow-up recommendations:  Activity:  as tolerated Diet:  heart healthy  diet  Comments:  Follow-up with appointment this week  Signed: [name], NP 08/03/2017, 3:23 PM

## 2017-08-03 NOTE — Plan of Care (Signed)
Pt continues to progress towards goals and d/c. RN will continue to monitor.  

## 2017-08-03 NOTE — Progress Notes (Signed)
Patient did attend the evening speaker AA meeting.  

## 2017-08-03 NOTE — Progress Notes (Addendum)
[facility] Group Notes:  (Nursing/MHT/Case Management/Adjunct)  Date:  08/03/2017  Time:  2:21 PM  Type of Therapy:  Nurse Education  Participation Level:  Active  Participation Quality:  Appropriate, Sharing  Affect:  Appropriate  Cognitive:  Alert, Appropriate and Oriented  Insight:  Appropriate and Good  Engagement in Group:  Engaged and Supportive  Modes of Intervention:  Activity, Discussion and Socialization  Summary of Progress/Problems: Patient participated appropriately and had good information to share with the group.  Wyeth Hoffer 08/03/2017, 2:21 PM

## 2017-08-04 LAB — GLUCOSE, CAPILLARY
Glucose-Capillary: 108 mg/dL — ABNORMAL HIGH (ref 65–99)
Glucose-Capillary: 222 mg/dL — ABNORMAL HIGH (ref 65–99)

## 2017-08-04 NOTE — Progress Notes (Addendum)
  [facility] Adult Case Management Discharge Plan :  Will you be returning to the same living situation after discharge:  Yes,  home At discharge, do you have transportation home?: Yes,  bus Do you have the ability to pay for your medications: Yes,  medicare  Release of information consent forms completed and submitted to medical records by CSW.  Patient to Follow up at: Follow-up Information    [facility] Go on 08/08/2017.   Specialty:  Family Medicine Why:  Appointment is Friday, 08/08/17 at 10:15am with Dr. Eula Fried. Please be sure to bring any discharge paperwork from the hospital. Cancel this appt if you get into treatment. Thank you.  Contact information: [address] [place] [address] [phone]        [facility] up.   Why:  You have been accepted for admission. [employer] bus information provided to pt.  Contact information: [address] [place] [address] [phone]        [facility] Follow up.   Why:  Referral faxed: 08/01/17. Please call admissions office at [phone] to check status of referral and waitlist if still interested in placement after discharge. Thank you.  Contact information: [place] [address] Phone: [phone] Fax: [phone]          Next level of care provider has access to [facility] and Suicide Prevention discussed: Yes,  SPE completed with pt; pt declined to consent to family contact. SPI pamphlet and mobile Crisis information provided to pt.   Have you used any form of tobacco in the last 30 days? (Cigarettes, Smokeless Tobacco, Cigars, and/or Pipes): No  Has patient been referred to the Quitline?: N/A patient is not a smoker  Patient has been referred for addiction treatment: Yes  Avelina Laine, LCSW 08/04/2017, 3:10 PM

## 2017-08-04 NOTE — Progress Notes (Signed)
Inpatient Diabetes Program Recommendations  AACE/ADA: New Consensus Statement on Inpatient Glycemic Control (2015)  Target Ranges:  Prepandial:   less than 140 mg/dL      Peak postprandial:   less than 180 mg/dL (1-2 hours)      Critically ill patients:  140 - 180 mg/dL   Results for HODGES, TREIBER (MRN [id number]) as of 08/04/2017 09:04  Ref. Range 08/03/2017 05:52 08/03/2017 11:42 08/03/2017 16:53 08/03/2017 21:02 08/04/2017 06:03  Glucose-Capillary Latest Ref Range: 65 - 99 mg/dL 152 (H) 183 (H) 308 (H) 221 (H) 108 (H)   Review of Glycemic Control  Diabetes history: DM2 Outpatient Diabetes medications: Tresiba 40 units QHS, metformin (no longer taking) Current orders for Inpatient glycemic control: Lantus 60 units Q1800, Novolog 0-9 units tidwc  Inpatient Diabetes Program Recommendations:     Glucose increases after meals, Consider Novolog 3 units tid meal coverage if patient consumes at least 50% of meals.  Will continue to follow.  Thank you. Tama Headings RN, MSN, BC-ADM, [facility] Inpatient Diabetes Coordinator Team Pager [phone] (8a-5p)

## 2017-08-04 NOTE — Progress Notes (Signed)
Nursing note 7p-7a  Pt observed interacting with peers on unit this shift. Displayed a flat affect and irritable mood upon interaction with this Probation officer. Pt denies pain ,denies SI/HI, and also denies audio or visual hallucinations at this time.   Pt able to contract for safety. Goal: have a good day. Pt resting in bed with eyes closed with no signs or symptoms of pain or distress noted. Pt continues to remain safe on the unit and is observed by rounding every 15 min. RN will continue to monitor.

## 2017-08-04 NOTE — Progress Notes (Signed)
[facility] and [facility] referrals still in review. Per Admissions at [facility], pt was in their program a few months ago but they are willing to review him again for potential admission. CSW informed pt that when he discharges, he can follow-up with these programs regarding referrals. He has outpatient follow-[facility] scheduled with [facility] and has been provided with AA/NA information and [place] pamphlet for additional community support.   Mckynleigh Mussell S. Ouida Sills, MSW, LCSW Clinical Social Worker 08/04/2017 9:32 AM

## 2017-08-04 NOTE — Progress Notes (Signed)
Patient discharged to lobby. Patient was stable and appreciative at that time. All papers and prescriptions were given and valuables returned. Verbal understanding expressed. Denies SI/HI and A/VH. Patient given opportunity to express concerns and ask questions.  

## 2017-08-04 NOTE — [facility] Suicide Risk Assessment (Signed)
[facility] Discharge Suicide Risk Assessment   Principal Problem: MDD (major depressive disorder), recurrent episode, severe ([place]) Discharge Diagnoses:  Patient Active Problem List   Diagnosis Date Noted  . MDD (major depressive disorder), recurrent episode, severe ([place]) [F33.2] 07/30/2017  . Cocaine use disorder, moderate, dependence ([place]) [F14.20] 01/04/2015  . Severe recurrent major depression with psychotic features ([place]) [F33.3] 01/04/2015  . Depression, major, recurrent, severe with psychosis ([place]) [F33.3] 08/29/2013  . Suicidal ideations [R45.851] 08/28/2013  . Chest pain [R07.9] 06/30/2013  . HTN (hypertension) [I10] 06/30/2013  . Uncontrolled diabetes mellitus ([place]) [E11.65] 06/30/2013  . Bipolar affect, depressed ([place]) [F31.30] 05/13/2011    Total Time spent with patient: 30 minutes  Musculoskeletal: Strength & Muscle Tone: decreased Gait & Station: broad based Patient leans: N/A  Psychiatric Specialty Exam: Review of Systems  Musculoskeletal: Positive for back pain, joint pain and myalgias.  All other systems reviewed and are negative.   Blood pressure (!) 172/107, pulse 77, temperature 97.6 F (36.4 C), temperature source Oral, resp. rate 18, height 5\' 6"  (1.676 m), weight 121.1 kg (267 lb).Body mass index is 43.09 kg/m.  General Appearance: Casual  Eye Contact::  Fair  Speech:  Normal Rate409  Volume:  Normal  Mood:  Euthymic  Affect:  Appropriate  Thought Process:  Coherent  Orientation:  Full (Time, Place, and Person)  Thought Content:  Logical  Suicidal Thoughts:  No  Homicidal Thoughts:  No  Memory:  Immediate;   Fair Recent;   Fair Remote;   Fair  Judgement:  Intact  Insight:  Lacking  Psychomotor Activity:  Normal  Concentration:  Fair  Recall:  [employer] of Knowledge:Fair  Language: Fair  Akathisia:  Negative  Handed:  Right  AIMS (if indicated):     Assets:  Desire for Improvement Resilience  Sleep:  Number of Hours: 5.25  Cognition: WNL   ADL's:  Intact   Mental Status Per Nursing Assessment::   On Admission:  Suicidal ideation indicated by patient, Self-harm thoughts  Demographic Factors:  Male, Low socioeconomic status and Unemployed  Loss Factors: NA  Historical Factors: Impulsivity  Risk Reduction Factors:   NA  Continued Clinical Symptoms:  Depression:   Comorbid alcohol abuse/dependence Impulsivity  Cognitive Features That Contribute To Risk:  None    Suicide Risk:  Minimal: No identifiable suicidal ideation.  Patients presenting with no risk factors but with morbid ruminations; may be classified as minimal risk based on the severity of the depressive symptoms  Follow-up Information    [facility] Go on 08/08/2017.   Specialty:  Family Medicine Why:  Appointment is Friday, 08/08/17 at 10:15am with Dr. Eula Fried. Please be sure to bring any discharge paperwork from the hospital.  Contact information: [address] [place] [address] [phone]        [facility] up.   Why:  Referral faxed 08/01/17. Please call admissions [phone] to check status of referral and waitlist if you are still interested in placement after discharge. Thank you.  Contact information: [address] [place] [address] [phone]        [facility] Follow up.   Why:  Referral faxed: 08/01/17. Please call admissions office at [phone] to check status of referral and waitlist if still interested in placement after discharge. Thank you.  Contact information: [place] [address] Phone: [phone] Fax: [phone]  Plan Of Care/Follow-up recommendations:  Activity:  ad lib  [name], MD 08/04/2017, 7:57 AM

## 2017-08-04 NOTE — Progress Notes (Signed)
Recreation Therapy Notes  Date:  6.10.19  Time: 0930 Location: [address] Dayroom  Group Topic: Stress Management  Goal Area(s) Addresses:  Patient will verbalize importance of using healthy stress management.  Patient will identify positive emotions associated with healthy stress management.   Behavioral Response: Engaged  Intervention: Stress Management  Activity :  Guided Imagery.  LRT introduced patients to the stress management technique of guided imagery.  LRT read a script that allowed patients to take a mental vacation to the beach.  Patients were to follow along as script was read to engage in the activity.  Education:  Stress Management, Discharge Planning.   Education Outcome: Acknowledges edcuation/In group clarification offered/Needs additional education  Clinical Observations/Feedback: Pt attended and participated in group.     Victorino Sparrow, LRT/CTRS         Ria Comment, Frankie Zito A 08/04/2017 10:59 AM

## 2017-08-04 NOTE — [facility] Group Notes (Signed)
Group opened with brief discussion and psycho-social ed around grief and loss in relationships and in relation to self - identifying life patterns, circumstances, changes connected to losses. Established group norms and goals.  Group goal of establishing open and affirming space for members to process loss and experience with grief, normalize grief experience and provide psycho social education and grief support.. Group members engaged in facilitated dialog and support.  Engaged with Worden's four tasks of grief, identifying how these tasks show in their own experiences.

## 2017-09-28 ENCOUNTER — Emergency Department (HOSPITAL_COMMUNITY): Payer: Medicare Other

## 2017-09-28 ENCOUNTER — Encounter (HOSPITAL_COMMUNITY): Payer: Self-pay

## 2017-09-28 ENCOUNTER — Observation Stay (HOSPITAL_COMMUNITY)
Admission: EM | Admit: 2017-09-28 | Discharge: 2017-10-01 | Disposition: A | Discharge status: Home / Self Care | Payer: Medicare Other | Attending: Internal Medicine | Admitting: Internal Medicine

## 2017-09-28 DIAGNOSIS — I272 Pulmonary hypertension, unspecified: Secondary | ICD-10-CM | POA: Diagnosis not present

## 2017-09-28 DIAGNOSIS — I350 Nonrheumatic aortic (valve) stenosis: Secondary | ICD-10-CM | POA: Diagnosis not present

## 2017-09-28 DIAGNOSIS — I5022 Chronic systolic (congestive) heart failure: Secondary | ICD-10-CM | POA: Diagnosis not present

## 2017-09-28 DIAGNOSIS — W1830XA Fall on same level, unspecified, initial encounter: Secondary | ICD-10-CM | POA: Diagnosis not present

## 2017-09-28 DIAGNOSIS — E1169 Type 2 diabetes mellitus with other specified complication: Secondary | ICD-10-CM | POA: Diagnosis present

## 2017-09-28 DIAGNOSIS — Z8249 Family history of ischemic heart disease and other diseases of the circulatory system: Secondary | ICD-10-CM | POA: Insufficient documentation

## 2017-09-28 DIAGNOSIS — E785 Hyperlipidemia, unspecified: Secondary | ICD-10-CM | POA: Diagnosis not present

## 2017-09-28 DIAGNOSIS — M47814 Spondylosis without myelopathy or radiculopathy, thoracic region: Secondary | ICD-10-CM | POA: Insufficient documentation

## 2017-09-28 DIAGNOSIS — E7800 Pure hypercholesterolemia, unspecified: Secondary | ICD-10-CM | POA: Diagnosis not present

## 2017-09-28 DIAGNOSIS — R0789 Other chest pain: Secondary | ICD-10-CM | POA: Diagnosis not present

## 2017-09-28 DIAGNOSIS — I129 Hypertensive chronic kidney disease with stage 1 through stage 4 chronic kidney disease, or unspecified chronic kidney disease: Secondary | ICD-10-CM | POA: Diagnosis present

## 2017-09-28 DIAGNOSIS — K760 Fatty (change of) liver, not elsewhere classified: Secondary | ICD-10-CM | POA: Insufficient documentation

## 2017-09-28 DIAGNOSIS — Z794 Long term (current) use of insulin: Secondary | ICD-10-CM | POA: Diagnosis not present

## 2017-09-28 DIAGNOSIS — R079 Chest pain, unspecified: Secondary | ICD-10-CM | POA: Diagnosis present

## 2017-09-28 DIAGNOSIS — E669 Obesity, unspecified: Secondary | ICD-10-CM | POA: Insufficient documentation

## 2017-09-28 DIAGNOSIS — I2510 Atherosclerotic heart disease of native coronary artery without angina pectoris: Secondary | ICD-10-CM

## 2017-09-28 DIAGNOSIS — N179 Acute kidney failure, unspecified: Secondary | ICD-10-CM | POA: Insufficient documentation

## 2017-09-28 DIAGNOSIS — E1165 Type 2 diabetes mellitus with hyperglycemia: Secondary | ICD-10-CM | POA: Diagnosis not present

## 2017-09-28 DIAGNOSIS — Z823 Family history of stroke: Secondary | ICD-10-CM | POA: Insufficient documentation

## 2017-09-28 DIAGNOSIS — F319 Bipolar disorder, unspecified: Secondary | ICD-10-CM | POA: Insufficient documentation

## 2017-09-28 DIAGNOSIS — E1140 Type 2 diabetes mellitus with diabetic neuropathy, unspecified: Secondary | ICD-10-CM | POA: Diagnosis not present

## 2017-09-28 DIAGNOSIS — K8020 Calculus of gallbladder without cholecystitis without obstruction: Secondary | ICD-10-CM | POA: Insufficient documentation

## 2017-09-28 DIAGNOSIS — R4182 Altered mental status, unspecified: Secondary | ICD-10-CM | POA: Insufficient documentation

## 2017-09-28 DIAGNOSIS — Z79899 Other long term (current) drug therapy: Secondary | ICD-10-CM | POA: Insufficient documentation

## 2017-09-28 DIAGNOSIS — F419 Anxiety disorder, unspecified: Secondary | ICD-10-CM | POA: Diagnosis not present

## 2017-09-28 DIAGNOSIS — Z87892 Personal history of anaphylaxis: Secondary | ICD-10-CM | POA: Diagnosis not present

## 2017-09-28 DIAGNOSIS — F1410 Cocaine abuse, uncomplicated: Secondary | ICD-10-CM | POA: Insufficient documentation

## 2017-09-28 DIAGNOSIS — IMO0002 Reserved for concepts with insufficient information to code with codable children: Secondary | ICD-10-CM

## 2017-09-28 DIAGNOSIS — I110 Hypertensive heart disease with heart failure: Secondary | ICD-10-CM | POA: Diagnosis not present

## 2017-09-28 DIAGNOSIS — Z6841 Body Mass Index (BMI) 40.0 and over, adult: Secondary | ICD-10-CM | POA: Insufficient documentation

## 2017-09-28 DIAGNOSIS — E871 Hypo-osmolality and hyponatremia: Secondary | ICD-10-CM | POA: Insufficient documentation

## 2017-09-28 DIAGNOSIS — E119 Type 2 diabetes mellitus without complications: Secondary | ICD-10-CM | POA: Diagnosis present

## 2017-09-28 DIAGNOSIS — E872 Acidosis: Secondary | ICD-10-CM | POA: Insufficient documentation

## 2017-09-28 DIAGNOSIS — I25119 Atherosclerotic heart disease of native coronary artery with unspecified angina pectoris: Secondary | ICD-10-CM | POA: Insufficient documentation

## 2017-09-28 DIAGNOSIS — R739 Hyperglycemia, unspecified: Secondary | ICD-10-CM | POA: Diagnosis present

## 2017-09-28 DIAGNOSIS — Z87891 Personal history of nicotine dependence: Secondary | ICD-10-CM | POA: Insufficient documentation

## 2017-09-28 DIAGNOSIS — J986 Disorders of diaphragm: Secondary | ICD-10-CM

## 2017-09-28 LAB — I-STAT CHEM 8, ED
BUN: 37 mg/dL — ABNORMAL HIGH (ref 6–20)
Calcium, Ion: 1.07 mmol/L — ABNORMAL LOW (ref 1.15–1.40)
Chloride: 101 mmol/L (ref 98–111)
Creatinine, Ser: 1.3 mg/dL — ABNORMAL HIGH (ref 0.61–1.24)
Glucose, Bld: 507 mg/dL (ref 70–99)
HCT: 44 % (ref 39.0–52.0)
Hemoglobin: 15 g/dL (ref 13.0–17.0)
Potassium: 4.2 mmol/L (ref 3.5–5.1)
Sodium: 132 mmol/L — ABNORMAL LOW (ref 135–145)
TCO2: 20 mmol/L — ABNORMAL LOW (ref 22–32)

## 2017-09-28 LAB — CBC WITH DIFFERENTIAL/PLATELET
Abs Immature Granulocytes: 0 10*3/uL (ref 0.0–0.1)
Basophils Absolute: 0 10*3/uL (ref 0.0–0.1)
Basophils Relative: 0 %
Eosinophils Absolute: 0 10*3/uL (ref 0.0–0.7)
Eosinophils Relative: 1 %
HCT: 39.9 % (ref 39.0–52.0)
Hemoglobin: 12.9 g/dL — ABNORMAL LOW (ref 13.0–17.0)
Immature Granulocytes: 0 %
Lymphocytes Relative: 32 %
Lymphs Abs: 1.6 10*3/uL (ref 0.7–4.0)
MCH: 26.3 pg (ref 26.0–34.0)
MCHC: 32.3 g/dL (ref 30.0–36.0)
MCV: 81.4 fL (ref 78.0–100.0)
Monocytes Absolute: 0.6 10*3/uL (ref 0.1–1.0)
Monocytes Relative: 12 %
Neutro Abs: 2.7 10*3/uL (ref 1.7–7.7)
Neutrophils Relative %: 55 %
Platelets: 201 10*3/uL (ref 150–400)
RBC: 4.9 MIL/uL (ref 4.22–5.81)
RDW: 13.4 % (ref 11.5–15.5)
WBC: 4.9 10*3/uL (ref 4.0–10.5)

## 2017-09-28 LAB — COMPREHENSIVE METABOLIC PANEL
ALT: 24 U/L (ref 0–44)
AST: 23 U/L (ref 15–41)
Albumin: 3.4 g/dL — ABNORMAL LOW (ref 3.5–5.0)
Alkaline Phosphatase: 57 U/L (ref 38–126)
Anion gap: 13 (ref 5–15)
BUN: 33 mg/dL — ABNORMAL HIGH (ref 6–20)
CO2: 21 mmol/L — ABNORMAL LOW (ref 22–32)
Calcium: 9.2 mg/dL (ref 8.9–10.3)
Chloride: 99 mmol/L (ref 98–111)
Creatinine, Ser: 1.45 mg/dL — ABNORMAL HIGH (ref 0.61–1.24)
GFR calc Af Amer: >60 mL/min (ref >60)
GFR calc non Af Amer: 52 mL/min — ABNORMAL LOW (ref >60)
Glucose, Bld: 494 mg/dL — ABNORMAL HIGH (ref 70–99)
Potassium: 4.2 mmol/L (ref 3.5–5.1)
Sodium: 133 mmol/L — ABNORMAL LOW (ref 135–145)
Total Bilirubin: 0.8 mg/dL (ref 0.3–1.2)
Total Protein: 7.2 g/dL (ref 6.5–8.1)

## 2017-09-28 LAB — CBG MONITORING, ED
Glucose-Capillary: 465 mg/dL — ABNORMAL HIGH (ref 70–99)
Glucose-Capillary: 359 mg/dL — ABNORMAL HIGH (ref 70–99)
Glucose-Capillary: 366 mg/dL — ABNORMAL HIGH (ref 70–99)

## 2017-09-28 LAB — I-STAT VENOUS BLOOD GAS, ED
Acid-Base Excess: 1 mmol/L (ref 0.0–2.0)
Bicarbonate: 24.7 mmol/L (ref 20.0–28.0)
O2 Saturation: 89 %
TCO2: 26 mmol/L (ref 22–32)
pCO2, Ven: 37.2 mmHg — ABNORMAL LOW (ref 44.0–60.0)
pH, Ven: 7.431 — ABNORMAL HIGH (ref 7.250–7.430)
pO2, Ven: 55 mmHg — ABNORMAL HIGH (ref 32.0–45.0)

## 2017-09-28 LAB — HEMOGLOBIN A1C
Hgb A1c MFr Bld: 13.1 % — ABNORMAL HIGH (ref 4.8–5.6)
Mean Plasma Glucose: 329.27 mg/dL

## 2017-09-28 LAB — TROPONIN I: Troponin I: 0.03 ng/mL (ref <0.03)

## 2017-09-28 LAB — GLUCOSE, CAPILLARY: Glucose-Capillary: 219 mg/dL — ABNORMAL HIGH (ref 70–99)

## 2017-09-28 LAB — MRSA PCR SCREENING: MRSA by PCR: NEGATIVE

## 2017-09-28 LAB — I-STAT TROPONIN, ED: Troponin i, poc: 0 ng/mL (ref 0.00–0.08)

## 2017-09-28 MED ORDER — ENOXAPARIN SODIUM 40 MG/0.4ML ~~LOC~~ SOLN
40.0000 mg | SUBCUTANEOUS | Status: DC
  Administered 2017-09-28 – 2017-09-30 (×3): 40 mg via SUBCUTANEOUS
  Filled 2017-09-28 (×4): qty 0.4

## 2017-09-28 MED ORDER — GABAPENTIN 300 MG PO CAPS: 600.0000 mg | ORAL_CAPSULE | Freq: Three times a day (TID) | ORAL | Status: DC

## 2017-09-28 MED ORDER — ASPIRIN EC 81 MG PO TBEC
81.0000 mg | DELAYED_RELEASE_TABLET | Freq: Every day | ORAL | Status: DC
  Administered 2017-09-29 – 2017-10-01 (×3): 81 mg via ORAL
  Filled 2017-09-28 (×3): qty 1

## 2017-09-28 MED ORDER — ASPIRIN 81 MG PO CHEW
324.0000 mg | CHEWABLE_TABLET | Freq: Once | ORAL | Status: AC
  Administered 2017-09-28: 324 mg via ORAL
  Filled 2017-09-28: qty 4

## 2017-09-28 MED ORDER — INSULIN GLARGINE 100 UNIT/ML ~~LOC~~ SOLN
40.0000 [IU] | Freq: Every day | SUBCUTANEOUS | Status: DC
  Administered 2017-09-28 – 2017-10-01 (×4): 40 [IU] via SUBCUTANEOUS
  Filled 2017-09-28 (×4): qty 0.4

## 2017-09-28 MED ORDER — BUPROPION HCL ER (XL) 150 MG PO TB24
300.0000 mg | ORAL_TABLET | Freq: Every day | ORAL | Status: DC
  Administered 2017-09-29 – 2017-10-01 (×3): 300 mg via ORAL
  Filled 2017-09-28 (×3): qty 2

## 2017-09-28 MED ORDER — SODIUM CHLORIDE 0.9 % IV BOLUS
500.0000 mL | Freq: Once | INTRAVENOUS | Status: AC
  Administered 2017-09-28: 500 mL via INTRAVENOUS

## 2017-09-28 MED ORDER — POLYETHYLENE GLYCOL 3350 17 G PO PACK: 17.0000 g | PACK | Freq: Every day | ORAL | Status: DC | PRN

## 2017-09-28 MED ORDER — SODIUM CHLORIDE 0.9 % IV BOLUS
2000.0000 mL | Freq: Once | INTRAVENOUS | Status: AC
  Administered 2017-09-28: 2000 mL via INTRAVENOUS

## 2017-09-28 MED ORDER — SODIUM CHLORIDE 0.9% FLUSH
3.0000 mL | Freq: Two times a day (BID) | INTRAVENOUS | Status: DC
  Administered 2017-09-28 – 2017-10-01 (×6): 3 mL via INTRAVENOUS

## 2017-09-28 MED ORDER — INSULIN ASPART 100 UNIT/ML ~~LOC~~ SOLN
0.0000 [IU] | Freq: Three times a day (TID) | SUBCUTANEOUS | Status: DC
  Administered 2017-09-28: 11 [IU] via SUBCUTANEOUS
  Administered 2017-09-29: 8 [IU] via SUBCUTANEOUS
  Administered 2017-09-29: 11 [IU] via SUBCUTANEOUS
  Administered 2017-09-29 – 2017-09-30 (×3): 8 [IU] via SUBCUTANEOUS
  Administered 2017-10-01: 3 [IU] via SUBCUTANEOUS
  Administered 2017-10-01: 5 [IU] via SUBCUTANEOUS

## 2017-09-28 MED ORDER — INSULIN ASPART 100 UNIT/ML ~~LOC~~ SOLN
0.0000 [IU] | Freq: Every day | SUBCUTANEOUS | Status: DC
  Administered 2017-09-28 – 2017-09-29 (×2): 2 [IU] via SUBCUTANEOUS
  Administered 2017-09-30: 3 [IU] via SUBCUTANEOUS

## 2017-09-28 MED ORDER — ACETAMINOPHEN 325 MG PO TABS: 650.0000 mg | ORAL_TABLET | Freq: Four times a day (QID) | ORAL | Status: DC | PRN

## 2017-09-28 MED ORDER — NITROGLYCERIN 0.4 MG SL SUBL
0.4000 mg | SUBLINGUAL_TABLET | SUBLINGUAL | Status: DC | PRN
  Administered 2017-09-29: 0.4 mg via SUBLINGUAL
  Filled 2017-09-28: qty 1

## 2017-09-28 MED ORDER — IOPAMIDOL (ISOVUE-370) INJECTION 76%
INTRAVENOUS | Status: AC
  Administered 2017-09-28: 10:00:00
  Filled 2017-09-28: qty 100

## 2017-09-28 MED ORDER — SODIUM CHLORIDE 0.9 % IV BOLUS: 1000.0000 mL | Freq: Once | INTRAVENOUS | Status: DC

## 2017-09-28 MED ORDER — SODIUM CHLORIDE 0.9 % IV BOLUS
1000.0000 mL | Freq: Once | INTRAVENOUS | Status: AC
  Administered 2017-09-28: 1000 mL via INTRAVENOUS

## 2017-09-28 MED ORDER — LORAZEPAM 2 MG/ML IJ SOLN
0.5000 mg | Freq: Once | INTRAMUSCULAR | Status: DC
  Filled 2017-09-28: qty 1

## 2017-09-28 MED ORDER — INSULIN ASPART 100 UNIT/ML ~~LOC~~ SOLN
10.0000 [IU] | Freq: Once | SUBCUTANEOUS | Status: AC
  Administered 2017-09-28: 10 [IU] via SUBCUTANEOUS
  Filled 2017-09-28: qty 1

## 2017-09-28 MED ORDER — ACETAMINOPHEN 650 MG RE SUPP: 650.0000 mg | Freq: Four times a day (QID) | RECTAL | Status: DC | PRN

## 2017-09-28 NOTE — H&P (Addendum)
Date: 09/28/2017               Patient Name:  [name] MRN: [id number]  DOB: 1960-09-17 Age / Sex: 57 y.o., male   PCP: [facility]         Medical Service: Internal Medicine Teaching Service         Attending Physician: Dr. Quintella Reichert, MD    First Contact: Dr. Sharon Seller Pager: [phone]  Second Contact: Dr. Reesa Chew Pager: [phone]       After Hours (After 5p/  First Contact Pager: [phone]  weekends / holidays): Second Contact Pager: [phone]   Chief Complaint: Chest pain and weakness   History of Present Illness:  [name] is a 57 yo male with history of HTN, TIIDM with neuropathy, systolic CHF (5400 EF 86%) drug abuse, SI, suicide attempt and MDD who presented to the ED with weakness, chest pain, and AMS per his wife. He states for the last three days his wife has noticed he has been acting confused and also states he also has felt confused. He has felt weaker than usual as well, and on the way to the bus stop fell. He states he did not have LOC but just felt that his legs were rubbery and would not hold him up. He states he was having sharp, stabbing CP at the time as well, although he is feeling better now. He denies jaw pain or radiating pain into his arms or numbness. He states he did not hit his head when he fell. He does endorse some baseline instability as he has had previous knee surgeries.  He denies recent illness, change in medications, or changes in BM. When asked about his high blood sugar he states it always runs high and he gets dizzy if it falls below 120. He states he has been urinating more than usual. Denies dysuria. He denies recent drug use but states he did use cocaine when his sister and father passed away in a car accident in May.  He states that he has been depressed recently. He does think about hurting himself frequently, the last time being a week ago, but states he has not formed a plan to hurt himself. Patient has a history of suicide  attempts and was last admitted to behavioral health for lisinopril/melatonin overdose.   On arrival to the ED he was hemodynamically stable, BP 162/109, Pulse 97, resp 19 and sats at 100%. Blood glucose was 465, he was hyponatremic at 133. Blood gas showed pH of 7.43, pO2 55, and bicarb 24.7. Creatinine was 1.45 and BUN 33. Troponin was 0. CBC was wnl. He was given 40U Lantus, 10U novolog, and 2L NS bolus.   Meds:  Current Meds  Medication Sig  . insulin degludec (TRESIBA FLEXTOUCH) 100 UNIT/ML SOPN FlexTouch Pen Inject 40 Units into the skin every evening.      Allergies: Allergies as of 09/28/2017 - Review Complete 09/28/2017  Allergen Reaction Noted  . Other Anaphylaxis 08/28/2013   Past Medical History:  Diagnosis Date  . Bipolar affective ([place])   . Depression   . Diabetes mellitus    uncontrolled   . Hypertension     Family History:  Mom: Cancer, Heart  Father: Heart Attack Sister: CVA @ 27  Social History: Denies smoking and alcohol. States he used cocaine in May when his sister and father died in a car accident   Review of Systems: A complete ROS was negative  except as per HPI.   Physical Exam: Blood pressure (!) 162/109, pulse 97, resp. rate 19, SpO2 100 %.  Constitution: NAD, lying supine in bed, obese, appears stated age HENT: atraumatic, normocephalic Eyes: no scleral icterus, EOM intact Cardio: RRR, no murmurs, rubs, gallops, +1 pitting edema Respiratory: Clear to auscultation bilaterally  Abdominal: +BS, TTP diffusely, non-distended, soft MSK: strength 5/5, symmetric, sensations intact Neuro: A&Ox3, pleasant, cooperative Skin: clean, dry, and intact    EKG: personally reviewed my interpretation is old anterior infarction  CXR: personally reviewed my interpretation is elevated left diaphragm similar to previous cxr 2015.  CT: no acute abnormalities   CTA:  No evidence of aneurysmal dilatation or dissection. No sizable pulmonary emboli are  seen. Mild patchy atelectatic changes are noted Cholelithiasis without complicating factors.   By: Inez Catalina M.D.   On: 09/28/2017 12:20  Assessment & Plan by Problem: Active Problems:   * No active hospital problems. *  57yo male with hx of TIIDM with neuropathy, HTN, systolic CHF (3419 EF 62%) drug abuse, suicide attempt, SI and MDD who presented with weakness and confusion for the past three days and new onset chest pain. Patient also fell while walking to the ED today. He states he did not hit his head or have LOC.   #AMS secondary to dehydration: Patient endorses confusion for the past three days although A&O and does not appear confused on examination this afternoon. Recent confusion possibly secondary to dehydration. Given 2500cc bolus in ED.    - echo pending  - UA, UDS ordered - am BMP   #Atypical Chest pain: Troponin negative. Cardic cath 2015 with [facility] with insignificant findings. Described pain as sharp, stabbing pain relieved with rest and with movement. No numbness or jaw pain.   - troponins trended  #TIIDM: blood glucose 465 on admission. Patient states he usually runs high and is compliant with medications. Endorses increased frequency of urination. Normally on 1000mg  metformin bid & Tresiba 40U qhs.   - HbA1c - Lantus 40U qd, SSI - CBG monitoring  #Non-gapped metabolic acidosis: Likely secondary to dehydration as patient states he has not been drinking much and has increased frequency of urination, weakness. Given 2500cc NS bolus in ED  - am BMP   #Systolic CHF:  Last echo 2297, EF 40%. Not taking any medications at this time.  - echo pending   #AKI: Cr. likely secondary to dehydration. 1.47 --> 1.3. 2500cc bolus in ED  - am BMP   #Hyponatremia: likely secondary to elevations in blood glucose.  - am BMP - Urine Na  IVF: 2500cc NS bolus  VTE ppx: LVX  #MDD: Pt takes wellbutrin for depression  - cont. wellbutrin   Dispo: Admit patient to Inpatient  with expected length of stay greater than 2 midnights.  SignedMarty Heck, DO 09/28/2017, 1:39 PM  Pager: [phone]

## 2017-09-28 NOTE — ED Notes (Signed)
Meal tray ordered 

## 2017-09-28 NOTE — ED Notes (Signed)
CT staff notified - inquired about length of time for pt to be transported to CT scan - states will call for pt after completing the current pt on table - pt aware of same

## 2017-09-28 NOTE — ED Notes (Signed)
Transported to CT scan via stretcher per rad tech

## 2017-09-28 NOTE — ED Notes (Signed)
Pt's CBG was 465. Notified Larene Beach, RN.

## 2017-09-28 NOTE — ED Notes (Signed)
Ativan 0.5 mg wasted in trash can - witnessed by Trey Martinique, RN

## 2017-09-28 NOTE — Progress Notes (Signed)
Pt with suicidal attempts in past. Just released from [facility] June 2019. Upon assessment questions, patient meets risk for self-harm as pt has history of behavioral services. Pt scored high C-SSRS risk for [place] Suicide Severity rating scale. Spoke with MD about suicide precaution interventions. Because pt is not currently experiencing suicidal thoughts, MD suggests no suicide sitter needed but will implement telesitter for safety.

## 2017-09-28 NOTE — ED Notes (Signed)
Over to CT to speak with pt in reference to CT scan - able to calm pt with conversation alone - CT scan performed without event

## 2017-09-28 NOTE — ED Notes (Signed)
Report called to Varney Biles, RN - ready to accept pt

## 2017-09-28 NOTE — ED Triage Notes (Signed)
Pt arrived via [facility]; pt from home with c/o CP (sharp, constant, pressure) and it radiates to left from center; hx of TIA and MIs; pt struggles with ambulation and movement; pt sitting on side of steps but difficulty standing; pt uses cane; Pt had fall on sidewalk hitting knees and hands attempting break falls; 18G LFA; pt rec'd 275ml NS; CBG 468; 324mg  ASA and 1 nitro with no change in CP also says pain is shooting; 139/80; 92; 18; 100% on RA

## 2017-09-28 NOTE — ED Notes (Signed)
Rad tech called to report pt unable to do scan as he is claustrophobic - requesting sedative ED MD madfe aware of same - verbal orders given - see MAR

## 2017-09-28 NOTE — ED Notes (Signed)
Transported from CT scan via stretcher per this RN

## 2017-09-28 NOTE — ED Notes (Signed)
Spoke with RN Larene Beach in reference to critical results for patient @ [phone]

## 2017-09-28 NOTE — ED Provider Notes (Signed)
[name] Provider Note   CSN: [id number] Arrival date & time: 09/28/17  0808     History   Chief Complaint Chief Complaint  Patient presents with  . Chest Pain    HPI [name] is a 57 y.o. male.  The history is provided by the patient and the EMS personnel. No language interpreter was used.  Chest Pain     [name] is a 57 y.o. male who presents to the [name] complaining of weakness, chest pain. He presents to the [name] for evaluation of weakness and chest pain. This morning he decided to go to the [name] at the encouragement of his wife due to difficulty with walking, generalized weakness and occasional confusion. He was concerned about possible stroke. When he was walking to the [name] he was having weakness in both of his legs and fell forward to the ground. He also reports a day of intermittent central chest pain that is described as a stabbing sensation. He also has pain in his left lower quadrant. He denies any fevers, vomiting, diarrhea. He endorses one month of bilateral lower extremity edema. He has a family history of cardiac disease and CVA. He has a personal history of hypertension and diabetes. He does endorse using cocaine, last use was a few months ago. Past Medical History:  Diagnosis Date  . Bipolar affective ([place])   . Depression   . Diabetes mellitus    uncontrolled   . Hypertension     Patient Active Problem List   Diagnosis Date Noted  . Hyperglycemia 09/28/2017  . MDD (major depressive disorder), recurrent episode, severe ([place]) 07/30/2017  . Cocaine use disorder, moderate, dependence ([place]) 01/04/2015  . Severe recurrent major depression with psychotic features ([place]) 01/04/2015  . Depression, major, recurrent, severe with psychosis ([place]) 08/29/2013  . Suicidal ideations 08/28/2013  . Chest pain 06/30/2013  . HTN (hypertension) 06/30/2013  .  Uncontrolled diabetes mellitus ([place]) 06/30/2013  . Bipolar affect, depressed ([place]) 05/13/2011    Past Surgical History:  Procedure Laterality Date  . KNEE SURGERY     b/l knees; pending total knee         Home Medications    Prior to Admission medications   Medication Sig Start Date End Date Taking? Authorizing Provider  insulin degludec (TRESIBA FLEXTOUCH) 100 UNIT/ML SOPN FlexTouch Pen Inject 40 Units into the skin every evening.  05/27/17  Yes [provider]  buPROPion (WELLBUTRIN XL) 300 MG 24 hr tablet Take 1 tablet (300 mg total) by mouth daily at 6 PM. Patient not taking: Reported on 09/28/2017 08/03/17   Patrecia Pour, NP  gabapentin (NEURONTIN) 300 MG capsule Take 2 capsules (600 mg total) by mouth 3 (three) times daily. Patient not taking: Reported on 09/28/2017 08/03/17   Patrecia Pour, NP  metFORMIN (GLUCOPHAGE) 1000 MG tablet Take 1 tablet (1,000 mg total) by mouth 2 (two) times daily with a meal. For diabetes control Patient not taking: Reported on 07/29/2017 01/09/15   Pucilowska, Wardell Honour, MD  traZODone (DESYREL) 100 MG tablet Take 1 tablet (100 mg total) by mouth at bedtime as needed for sleep. Patient not taking: Reported on 09/28/2017 08/03/17   Patrecia Pour, NP    Family History Family History  Problem Relation Age of Onset  . Diabetes Unknown   . Cancer Mother        uterus; deceased   . Heart attack Mother 62  .  Hypertension Unknown   . Heart attack Father 71  . Stroke Sister        12/2012 age 70 y.o    Social History Social History   Tobacco Use  . Smoking status: Former Research scientist (life sciences)  . Smokeless tobacco: Never Used  Substance Use Topics  . Alcohol use: No  . Drug use: Yes    Types: Cocaine     Allergies   Other   Review of Systems Review of Systems  Cardiovascular: Positive for chest pain.  All other systems reviewed and are negative.    Physical Exam Updated Vital Signs BP (!) 162/109   Pulse 97   Resp 19   SpO2 100%    Physical Exam  Constitutional: He is oriented to person, place, and time. He appears well-developed and well-nourished.  HENT:  Head: Normocephalic and atraumatic.  Cardiovascular: Normal rate and regular rhythm.  No murmur heard. Pulmonary/Chest: Effort normal and breath sounds normal. No respiratory distress.  Abdominal: Soft. There is no rebound and no guarding.  Mild left-sided abdominal tenderness without guarding or rebound  Musculoskeletal: He exhibits no tenderness.  2+ pitting edema in BLE  Neurological: He is alert and oriented to person, place, and time.  4/5 strength and bilateral upper extremities with drift and bilateral upper extremities. 3/5 strength and bilateral lower extremities. Sensation to light touch intact in all four extremities. There is no focal weakness of facial muscles. EOM I.  Skin: Skin is warm and dry.  Psychiatric: He has a normal mood and affect. His behavior is normal.  Nursing note and vitals reviewed.    ED Treatments / Results  Labs (all labs ordered are listed, but only abnormal results are displayed) Labs Reviewed  COMPREHENSIVE METABOLIC PANEL - Abnormal; Notable for the following components:      Result Value   Sodium 133 (*)    CO2 21 (*)    Glucose, Bld 494 (*)    BUN 33 (*)    Creatinine, Ser 1.45 (*)    Albumin 3.4 (*)    GFR calc non Af Amer 52 (*)    All other components within normal limits  CBC WITH DIFFERENTIAL/PLATELET - Abnormal; Notable for the following components:   Hemoglobin 12.9 (*)    All other components within normal limits  CBG MONITORING, ED - Abnormal; Notable for the following components:   Glucose-Capillary 465 (*)    All other components within normal limits  I-STAT CHEM 8, ED - Abnormal; Notable for the following components:   Sodium 132 (*)    BUN 37 (*)    Creatinine, Ser 1.30 (*)    Glucose, Bld 507 (*)    Calcium, Ion 1.07 (*)    TCO2 20 (*)    All other components within normal limits  I-STAT  VENOUS BLOOD GAS, ED - Abnormal; Notable for the following components:   pH, Ven 7.431 (*)    pCO2, Ven 37.2 (*)    pO2, Ven 55.0 (*)    All other components within normal limits  CBG MONITORING, ED - Abnormal; Notable for the following components:   Glucose-Capillary 359 (*)    All other components within normal limits  CBG MONITORING, ED - Abnormal; Notable for the following components:   Glucose-Capillary 366 (*)    All other components within normal limits  URINALYSIS, ROUTINE W REFLEX MICROSCOPIC  RAPID URINE DRUG SCREEN, HOSP PERFORMED  HEMOGLOBIN A1C  HIV ANTIBODY (ROUTINE TESTING)  I-STAT TROPONIN, ED  EKG EKG Interpretation  Date/Time:  Sunday September 28 2017 08:43:02 EDT Ventricular Rate:  89 PR Interval:    QRS Duration: 84 QT Interval:  368 QTC Calculation: 448 R Axis:   32 Text Interpretation:  Sinus rhythm Anterior infarct, old Confirmed by Quintella Reichert [phone]) on 09/28/2017 8:53:59 AM Also confirmed by Quintella Reichert [phone]), editor Lynder Parents [phone])  on 09/28/2017 12:39:50 PM   Radiology Ct Head Wo Contrast  Result Date: 09/28/2017 CLINICAL DATA:  Altered level of consciousness EXAM: CT HEAD WITHOUT CONTRAST TECHNIQUE: Contiguous axial images were obtained from the base of the skull through the vertex without intravenous contrast. COMPARISON:  01/30/2010 FINDINGS: Brain: No evidence of acute infarction, hemorrhage, hydrocephalus, extra-axial collection or mass lesion/mass effect. Vascular: No hyperdense vessel or unexpected calcification. Skull: Normal. Negative for fracture or focal lesion. Sinuses/Orbits: No acute finding. Other: None. IMPRESSION: Normal head CT Electronically Signed   By: Inez Catalina M.D.   On: 09/28/2017 12:05   Dg Chest Port 1 View  Result Date: 09/28/2017 CLINICAL DATA:  Chest pain EXAM: PORTABLE CHEST 1 VIEW COMPARISON:  11/11/2015 FINDINGS: The heart size and mediastinal contours are within normal limits. Both lungs are clear. The  visualized skeletal structures are unremarkable. IMPRESSION: No active disease. Electronically Signed   By: Inez Catalina M.D.   On: 09/28/2017 08:54   Ct Angio Chest/abd/pel For Dissection W And/or W/wo  Result Date: 09/28/2017 CLINICAL DATA:  Chest and back pain EXAM: CT ANGIOGRAPHY CHEST, ABDOMEN AND PELVIS TECHNIQUE: Multidetector CT imaging through the chest, abdomen and pelvis was performed using the standard protocol during bolus administration of intravenous contrast. Multiplanar reconstructed images and MIPs were obtained and reviewed to evaluate the vascular anatomy. CONTRAST:  100 mL Isovue 370. COMPARISON:  None. FINDINGS: CTA CHEST FINDINGS Cardiovascular: Thoracic aorta is well visualized with mild calcification at the level of the aortic valve. No aneurysmal dilatation or dissection is seen. Mild coronary calcifications are noted. No cardiac enlargement is seen. The pulmonary artery as visualized is within normal limits although not timed for pulmonary embolus evaluation. Mediastinum/Nodes: Thoracic inlet is within normal limits. No hilar or mediastinal adenopathy is noted. The esophagus is unremarkable. Lungs/Pleura: Lungs are well aerated bilaterally. Mild patchy atelectatic changes are noted without focal confluent infiltrate. No sizable effusion or pneumothorax is seen. Musculoskeletal: No acute bony abnormality is noted. Degenerative change of the thoracic spine is seen. Review of the MIP images confirms the above findings. CTA ABDOMEN AND PELVIS FINDINGS VASCULAR Aorta: Abdominal aorta is well visualize without evidence of aneurysmal dilatation or dissection. No significant atherosclerotic changes are seen. Celiac: Patent without evidence of aneurysm, dissection, vasculitis or significant stenosis. SMA: Patent without evidence of aneurysm, dissection, vasculitis or significant stenosis. Renals: Single renal arteries are noted bilaterally. No focal stenosis is seen. IMA: Patent without evidence  of aneurysm, dissection, vasculitis or significant stenosis. Iliacs: Within normal limits. Veins: No venous abnormality is seen. Review of the MIP images confirms the above findings. NON-VASCULAR Hepatobiliary: Mild fatty infiltration of the liver is noted. The gallbladder is well distended with gallstones within. No pericholecystic fluid or inflammatory changes are seen. Pancreas: Unremarkable. No pancreatic ductal dilatation or surrounding inflammatory changes. Spleen: Normal in size without focal abnormality. Adrenals/Urinary Tract: The adrenal glands are within normal limits. Kidneys are well visualized bilaterally. No renal calculi or obstructive changes are seen. The bladder is well distended. Stomach/Bowel: No obstructive or inflammatory changes are identified. The appendix is not well visualized. No inflammatory changes to suggest  appendicitis are noted. Lymphatic: No significant lymphadenopathy is seen. Reproductive: Prostate is unremarkable. Other: No abdominal wall hernia or abnormality. No abdominopelvic ascites. Musculoskeletal: No acute or significant osseous findings. Review of the MIP images confirms the above findings. IMPRESSION: No evidence of aneurysmal dilatation or dissection. No sizable pulmonary emboli are seen. Mild patchy atelectatic changes are noted. Cholelithiasis without complicating factors. Electronically Signed   By: Inez Catalina M.D.   On: 09/28/2017 12:20    Procedures Procedures (including critical care time)  Medications Ordered in ED Medications  LORazepam (ATIVAN) injection 0.5 mg (0.5 mg Intravenous Not Given 09/28/17 1142)  nitroGLYCERIN (NITROSTAT) SL tablet 0.4 mg (has no administration in time range)  sodium chloride 0.9 % bolus 2,000 mL (2,000 mLs Intravenous New Bag/Given 09/28/17 1601)  insulin glargine (LANTUS) injection 40 Units (40 Units Subcutaneous Given 09/28/17 1543)  insulin aspart (novoLOG) injection 0-15 Units (has no administration in time range)    insulin aspart (novoLOG) injection 0-5 Units (has no administration in time range)  enoxaparin (LOVENOX) injection 40 mg (has no administration in time range)  sodium chloride flush (NS) 0.9 % injection 3 mL (has no administration in time range)  acetaminophen (TYLENOL) tablet 650 mg (has no administration in time range)    Or  acetaminophen (TYLENOL) suppository 650 mg (has no administration in time range)  polyethylene glycol (MIRALAX / GLYCOLAX) packet 17 g (has no administration in time range)  aspirin EC tablet 81 mg (has no administration in time range)  sodium chloride 0.9 % bolus 500 mL (0 mLs Intravenous Stopped 09/28/17 1141)  iopamidol (ISOVUE-370) 76 % injection (  Contrast Given 09/28/17 0945)  sodium chloride 0.9 % bolus 1,000 mL (0 mLs Intravenous Stopped 09/28/17 1141)  aspirin chewable tablet 324 mg (324 mg Oral Given 09/28/17 1542)  insulin aspart (novoLOG) injection 10 Units (10 Units Subcutaneous Given 09/28/17 1542)     Initial Impression / Assessment and Plan / ED Course  I have reviewed the triage vital signs and the nursing notes.  Pertinent labs & imaging results that were available during my care of the patient were reviewed by me and considered in my medical decision making (see chart for details).     Patient here for evaluation of chest pain that began last night. He also endorses progressive weakness and intermittent confusion. He is alert and does not appear confused at all in the [name]. 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 Initial troponin is negative. Given complaints of chest pain and abdominal pain CTA was obtained, which was negative for acute dissection. Medicine consulted for observation admission for hyperglycemia and chest pain observation. Patient updated of findings of studies recommendation for admission and he is in agreement with plan.  Final Clinical  Impressions(s) / ED Diagnoses   Final diagnoses:  None    ED Discharge Orders    None       Quintella Reichert, MD 09/28/17 [phone]

## 2017-09-29 ENCOUNTER — Observation Stay (HOSPITAL_COMMUNITY): Payer: Medicare Other

## 2017-09-29 ENCOUNTER — Other Ambulatory Visit: Payer: Self-pay

## 2017-09-29 DIAGNOSIS — R0789 Other chest pain: Secondary | ICD-10-CM | POA: Diagnosis not present

## 2017-09-29 DIAGNOSIS — E860 Dehydration: Secondary | ICD-10-CM

## 2017-09-29 DIAGNOSIS — I110 Hypertensive heart disease with heart failure: Secondary | ICD-10-CM

## 2017-09-29 DIAGNOSIS — I272 Pulmonary hypertension, unspecified: Secondary | ICD-10-CM

## 2017-09-29 DIAGNOSIS — F3130 Bipolar disorder, current episode depressed, mild or moderate severity, unspecified: Secondary | ICD-10-CM

## 2017-09-29 DIAGNOSIS — I5022 Chronic systolic (congestive) heart failure: Secondary | ICD-10-CM

## 2017-09-29 DIAGNOSIS — R531 Weakness: Secondary | ICD-10-CM | POA: Diagnosis not present

## 2017-09-29 DIAGNOSIS — F1410 Cocaine abuse, uncomplicated: Secondary | ICD-10-CM

## 2017-09-29 DIAGNOSIS — N179 Acute kidney failure, unspecified: Secondary | ICD-10-CM | POA: Diagnosis not present

## 2017-09-29 DIAGNOSIS — I350 Nonrheumatic aortic (valve) stenosis: Secondary | ICD-10-CM

## 2017-09-29 DIAGNOSIS — R011 Cardiac murmur, unspecified: Secondary | ICD-10-CM

## 2017-09-29 DIAGNOSIS — Z9181 History of falling: Secondary | ICD-10-CM

## 2017-09-29 DIAGNOSIS — F319 Bipolar disorder, unspecified: Secondary | ICD-10-CM

## 2017-09-29 DIAGNOSIS — Z79899 Other long term (current) drug therapy: Secondary | ICD-10-CM

## 2017-09-29 DIAGNOSIS — Z87891 Personal history of nicotine dependence: Secondary | ICD-10-CM

## 2017-09-29 DIAGNOSIS — E1140 Type 2 diabetes mellitus with diabetic neuropathy, unspecified: Secondary | ICD-10-CM

## 2017-09-29 DIAGNOSIS — E871 Hypo-osmolality and hyponatremia: Secondary | ICD-10-CM

## 2017-09-29 DIAGNOSIS — Z915 Personal history of self-harm: Secondary | ICD-10-CM

## 2017-09-29 DIAGNOSIS — Z794 Long term (current) use of insulin: Secondary | ICD-10-CM

## 2017-09-29 DIAGNOSIS — E669 Obesity, unspecified: Secondary | ICD-10-CM

## 2017-09-29 DIAGNOSIS — E7800 Pure hypercholesterolemia, unspecified: Secondary | ICD-10-CM

## 2017-09-29 LAB — LIPID PANEL
Cholesterol: 208 mg/dL — ABNORMAL HIGH (ref 0–200)
HDL: 39 mg/dL — ABNORMAL LOW (ref >40)
LDL Cholesterol: 153 mg/dL — ABNORMAL HIGH (ref 0–99)
Total CHOL/HDL Ratio: 5.3 RATIO
Triglycerides: 81 mg/dL (ref <150)
VLDL: 16 mg/dL (ref 0–40)

## 2017-09-29 LAB — ECHOCARDIOGRAM COMPLETE
Height: 68 in
Weight: 4338.65 oz

## 2017-09-29 LAB — BASIC METABOLIC PANEL
Anion gap: 8 (ref 5–15)
BUN: 22 mg/dL — ABNORMAL HIGH (ref 6–20)
CO2: 22 mmol/L (ref 22–32)
Calcium: 7.8 mg/dL — ABNORMAL LOW (ref 8.9–10.3)
Chloride: 102 mmol/L (ref 98–111)
Creatinine, Ser: 0.89 mg/dL (ref 0.61–1.24)
GFR calc Af Amer: >60 mL/min (ref >60)
GFR calc non Af Amer: >60 mL/min (ref >60)
Glucose, Bld: 264 mg/dL — ABNORMAL HIGH (ref 70–99)
Potassium: 3.5 mmol/L (ref 3.5–5.1)
Sodium: 132 mmol/L — ABNORMAL LOW (ref 135–145)

## 2017-09-29 LAB — GLUCOSE, CAPILLARY
Glucose-Capillary: 343 mg/dL — ABNORMAL HIGH (ref 70–99)
Glucose-Capillary: 264 mg/dL — ABNORMAL HIGH (ref 70–99)
Glucose-Capillary: 324 mg/dL — ABNORMAL HIGH (ref 70–99)
Glucose-Capillary: 291 mg/dL — ABNORMAL HIGH (ref 70–99)
Glucose-Capillary: 213 mg/dL — ABNORMAL HIGH (ref 70–99)

## 2017-09-29 LAB — RAPID URINE DRUG SCREEN, HOSP PERFORMED
Amphetamines: NOT DETECTED
Barbiturates: NOT DETECTED
Benzodiazepines: NOT DETECTED
Cocaine: POSITIVE — AB
Opiates: NOT DETECTED
Tetrahydrocannabinol: NOT DETECTED

## 2017-09-29 LAB — CBC
HCT: 33.9 % — ABNORMAL LOW (ref 39.0–52.0)
Hemoglobin: 10.9 g/dL — ABNORMAL LOW (ref 13.0–17.0)
MCH: 26 pg (ref 26.0–34.0)
MCHC: 32.2 g/dL (ref 30.0–36.0)
MCV: 80.9 fL (ref 78.0–100.0)
Platelets: 156 10*3/uL (ref 150–400)
RBC: 4.19 MIL/uL — ABNORMAL LOW (ref 4.22–5.81)
RDW: 13.2 % (ref 11.5–15.5)
WBC: 3.6 10*3/uL — ABNORMAL LOW (ref 4.0–10.5)

## 2017-09-29 LAB — TROPONIN I
Troponin I: 0.03 ng/mL (ref <0.03)
Troponin I: 0.03 ng/mL (ref <0.03)

## 2017-09-29 LAB — SODIUM, URINE, RANDOM: Sodium, Ur: 12 mmol/L

## 2017-09-29 LAB — HIV ANTIBODY (ROUTINE TESTING W REFLEX): HIV Screen 4th Generation wRfx: NONREACTIVE

## 2017-09-29 MED ORDER — BENAZEPRIL HCL 20 MG PO TABS
10.0000 mg | ORAL_TABLET | Freq: Every day | ORAL | Status: DC
  Administered 2017-09-29 – 2017-10-01 (×3): 10 mg via ORAL
  Filled 2017-09-29 (×3): qty 1

## 2017-09-29 MED ORDER — INSULIN ASPART 100 UNIT/ML ~~LOC~~ SOLN
4.0000 [IU] | Freq: Three times a day (TID) | SUBCUTANEOUS | Status: DC
  Administered 2017-09-29 – 2017-10-01 (×5): 4 [IU] via SUBCUTANEOUS

## 2017-09-29 NOTE — Consult Note (Signed)
Reason for Consult: Chest pain Referring Physician: Internal Medicine teaching service  [name] is an 57 y.o. male.  HPI:   57 y/o [place] American male with hypertension, type 2 DM, depression, bipolar disorder, h/o cocaine abuse, former smoker.  Patient presented to the hospital on 09/28/2017 with central sharp chest pain, radiating to his arms. He reportedly had been confused for last 2-3 days, and had generalized weakness. Patient tells me that he has had h/p strokes, although I do not see any evidence on CT head. He also reports exertional dyspnea and leg edema. He has history of cocaine abuse, and states that his last cocaine was 3 weeks ago. However, his urine drug tox screen is positive for cocaine.  Workup so far has showed mildly elevated, but flat troponin 0.03 ng/mL. Echocardiogram shows mod AS, normal EF, grade 1 DD, mild pulmonary hypertension.  Past Medical History:  Diagnosis Date  . Bipolar affective ([place])   . Depression   . Diabetes mellitus    uncontrolled   . Hypertension     Past Surgical History:  Procedure Laterality Date  . KNEE SURGERY     b/l knees; pending total knee     Family History  Problem Relation Age of Onset  . Diabetes Unknown   . Cancer Mother        uterus; deceased   . Heart attack Mother 70  . Hypertension Unknown   . Heart attack Father 56  . Stroke Sister        12/2012 age 3 y.o    Social History:  reports that he has quit smoking. He has never used smokeless tobacco. He reports that he has current or past drug history. Drug: Cocaine. He reports that he does not drink alcohol.  Allergies:  Allergies  Allergen Reactions  . Other Anaphylaxis    Allergy to GRITS , unknown exactly the specific ingredient he is allergic too    Medications: I have reviewed the patient's current medications.  Results for orders placed or performed during the hospital encounter of 09/28/17 (from the past 48 hour(s))  Comprehensive metabolic  panel     Status: Abnormal   Collection Time: 09/28/17  8:20 AM  Result Value Ref Range   Sodium 133 (L) 135 - 145 mmol/L   Potassium 4.2 3.5 - 5.1 mmol/L   Chloride 99 98 - 111 mmol/L   CO2 21 (L) 22 - 32 mmol/L   Glucose, Bld 494 (H) 70 - 99 mg/dL   BUN 33 (H) 6 - 20 mg/dL   Creatinine, Ser 1.45 (H) 0.61 - 1.24 mg/dL   Calcium 9.2 8.9 - 10.3 mg/dL   Total Protein 7.2 6.5 - 8.1 g/dL   Albumin 3.4 (L) 3.5 - 5.0 g/dL   AST 23 15 - 41 U/L   ALT 24 0 - 44 U/L   Alkaline Phosphatase 57 38 - 126 U/L   Total Bilirubin 0.8 0.3 - 1.2 mg/dL   GFR calc non Af Amer 52 (L) >60 mL/min   GFR calc Af Amer >60 >60 mL/min    Comment: (NOTE) The eGFR has been calculated using the CKD EPI equation. This calculation has not been validated in all clinical situations. eGFR's persistently <60 mL/min signify possible Chronic Kidney Disease.    Anion gap 13 5 - 15    Comment: Performed at [facility] [address] [place], [place] [address]  CBC with Differential     Status: Abnormal   Collection  Time: 09/28/17  8:20 AM  Result Value Ref Range   WBC 4.9 4.0 - 10.5 K/uL   RBC 4.90 4.22 - 5.81 MIL/uL   Hemoglobin 12.9 (L) 13.0 - 17.0 g/dL   HCT 39.9 39.0 - 52.0 %   MCV 81.4 78.0 - 100.0 fL   MCH 26.3 26.0 - 34.0 pg   MCHC 32.3 30.0 - 36.0 g/dL   RDW 13.4 11.5 - 15.5 %   Platelets 201 150 - 400 K/uL   Neutrophils Relative % 55 %   Neutro Abs 2.7 1.7 - 7.7 K/uL   Lymphocytes Relative 32 %   Lymphs Abs 1.6 0.7 - 4.0 K/uL   Monocytes Relative 12 %   Monocytes Absolute 0.6 0.1 - 1.0 K/uL   Eosinophils Relative 1 %   Eosinophils Absolute 0.0 0.0 - 0.7 K/uL   Basophils Relative 0 %   Basophils Absolute 0.0 0.0 - 0.1 K/uL   Immature Granulocytes 0 %   Abs Immature Granulocytes 0.0 0.0 - 0.1 K/uL    Comment: Performed at [facility] [address] [place], [place] [address]  Hemoglobin A1c     Status: Abnormal   Collection Time: 09/28/17  8:20 AM  Result Value Ref Range   Hgb  A1c MFr Bld 13.1 (H) 4.8 - 5.6 %    Comment: (NOTE) Pre diabetes:          5.7%-6.4% Diabetes:              >6.4% Glycemic control for   <7.0% adults with diabetes    Mean Plasma Glucose 329.27 mg/dL    Comment: Performed at [facility] [address] [place], [place] [address]  CBG monitoring, ED     Status: Abnormal   Collection Time: 09/28/17  8:27 AM  Result Value Ref Range   Glucose-Capillary 465 (H) 70 - 99 mg/dL   Comment 1 Notify RN    Comment 2 Document in Chart   I-stat troponin, ED     Status: None   Collection Time: 09/28/17  8:31 AM  Result Value Ref Range   Troponin i, poc 0.00 0.00 - 0.08 ng/mL   Comment 3            Comment: Due to the release kinetics of cTnI, a negative result within the first hours of the onset of symptoms does not rule out myocardial infarction with certainty. If myocardial infarction is still suspected, repeat the test at appropriate intervals.   I-stat Chem 8, ED     Status: Abnormal   Collection Time: 09/28/17  8:42 AM  Result Value Ref Range   Sodium 132 (L) 135 - 145 mmol/L   Potassium 4.2 3.5 - 5.1 mmol/L   Chloride 101 98 - 111 mmol/L   BUN 37 (H) 6 - 20 mg/dL   Creatinine, Ser 1.30 (H) 0.61 - 1.24 mg/dL   Glucose, Bld 507 (HH) 70 - 99 mg/dL   Calcium, Ion 1.07 (L) 1.15 - 1.40 mmol/L   TCO2 20 (L) 22 - 32 mmol/L   Hemoglobin 15.0 13.0 - 17.0 g/dL   HCT 44.0 39.0 - 52.0 %   Comment NOTIFIED PHYSICIAN   I-Stat venous blood gas, ED     Status: Abnormal   Collection Time: 09/28/17  9:29 AM  Result Value Ref Range   pH, Ven 7.431 (H) 7.250 - 7.430   pCO2, Ven 37.2 (L) 44.0 - 60.0 mmHg   pO2, Ven 55.0 (H) 32.0 -  45.0 mmHg   Bicarbonate 24.7 20.0 - 28.0 mmol/L   TCO2 26 22 - 32 mmol/L   O2 Saturation 89.0 %   Acid-Base Excess 1.0 0.0 - 2.0 mmol/L   Patient temperature HIDE    Sample type VENOUS   CBG monitoring, ED     Status: Abnormal   Collection Time: 09/28/17 12:08 PM  Result Value Ref Range   Glucose-Capillary  359 (H) 70 - 99 mg/dL  CBG monitoring, ED     Status: Abnormal   Collection Time: 09/28/17  3:32 PM  Result Value Ref Range   Glucose-Capillary 366 (H) 70 - 99 mg/dL  Glucose, capillary     Status: Abnormal   Collection Time: 09/28/17  5:28 PM  Result Value Ref Range   Glucose-Capillary 343 (H) 70 - 99 mg/dL  MRSA PCR Screening     Status: None   Collection Time: 09/28/17  5:32 PM  Result Value Ref Range   MRSA by PCR NEGATIVE NEGATIVE    Comment:        The GeneXpert MRSA Assay (FDA approved for NASAL specimens only), is one component of a comprehensive MRSA colonization surveillance program. It is not intended to diagnose MRSA infection nor to guide or monitor treatment for MRSA infections. Performed at [facility], [place] [address], [place], [place] [address]   Troponin I (q 6hr x 3)     Status: Abnormal   Collection Time: 09/28/17  7:18 PM  Result Value Ref Range   Troponin I 0.03 (HH) <0.03 ng/mL    Comment: CRITICAL RESULT CALLED TO, READ BACK BY AND VERIFIED WITH: RN H HUNT AT 2009 [id number] MARTINB Performed at [facility], [place] [address] [place], [place] [address]   Glucose, capillary     Status: Abnormal   Collection Time: 09/28/17  9:58 PM  Result Value Ref Range   Glucose-Capillary 219 (H) 70 - 99 mg/dL  Troponin I (q 6hr x 3)     Status: Abnormal   Collection Time: 09/29/17  1:29 AM  Result Value Ref Range   Troponin I 0.03 (HH) <0.03 ng/mL    Comment: CRITICAL VALUE NOTED.  VALUE IS CONSISTENT WITH PREVIOUSLY REPORTED AND CALLED VALUE. Performed at [facility], [place] [address] [place], [place] [address]   Basic metabolic panel     Status: Abnormal   Collection Time: 09/29/17  7:24 AM  Result Value Ref Range   Sodium 132 (L) 135 - 145 mmol/L   Potassium 3.5 3.5 - 5.1 mmol/L   Chloride 102 98 - 111 mmol/L   CO2 22 22 - 32 mmol/L   Glucose, Bld 264 (H) 70 - 99 mg/dL   BUN 22 (H) 6 - 20 mg/dL   Creatinine, Ser 0.89 0.61 - 1.24 mg/dL    Calcium 7.8 (L) 8.9 - 10.3 mg/dL   GFR calc non Af Amer >60 >60 mL/min   GFR calc Af Amer >60 >60 mL/min    Comment: (NOTE) The eGFR has been calculated using the CKD EPI equation. This calculation has not been validated in all clinical situations. eGFR's persistently <60 mL/min signify possible Chronic Kidney Disease.    Anion gap 8 5 - 15    Comment: Performed at [facility] [address] [place], [place] [address]  CBC     Status: Abnormal   Collection Time: 09/29/17  7:24 AM  Result Value Ref Range   WBC 3.6 (L) 4.0 - 10.5 K/uL  RBC 4.19 (L) 4.22 - 5.81 MIL/uL   Hemoglobin 10.9 (L) 13.0 - 17.0 g/dL    Comment: REPEATED TO VERIFY   HCT 33.9 (L) 39.0 - 52.0 %   MCV 80.9 78.0 - 100.0 fL   MCH 26.0 26.0 - 34.0 pg   MCHC 32.2 30.0 - 36.0 g/dL   RDW 13.2 11.5 - 15.5 %   Platelets 156 150 - 400 K/uL    Comment: Performed at [facility] [address] [place], [place] [address]  Troponin I (q 6hr x 3)     Status: Abnormal   Collection Time: 09/29/17  7:24 AM  Result Value Ref Range   Troponin I 0.03 (HH) <0.03 ng/mL    Comment: CRITICAL VALUE NOTED.  VALUE IS CONSISTENT WITH PREVIOUSLY REPORTED AND CALLED VALUE. Performed at [facility], [place] [address], [place],  [address]   Lipid panel     Status: Abnormal   Collection Time: 09/29/17  7:24 AM  Result Value Ref Range   Cholesterol 208 (H) 0 - 200 mg/dL   Triglycerides 81 <150 mg/dL   HDL 39 (L) >40 mg/dL   Total CHOL/HDL Ratio 5.3 RATIO   VLDL 16 0 - 40 mg/dL   LDL Cholesterol 153 (H) 0 - 99 mg/dL    Comment:        Total Cholesterol/HDL:CHD Risk Coronary Heart Disease Risk Table                     Men   Women  1/2 Average Risk   3.4   3.3  Average Risk       5.0   4.4  2 X Average Risk   9.6   7.1  3 X Average Risk  23.4   11.0        Use the calculated Patient Ratio above and the CHD Risk Table to determine the patient's CHD Risk.        ATP III CLASSIFICATION (LDL):  <100      mg/dL   Optimal  100-129  mg/dL   Near or Above                    Optimal  130-159  mg/dL   Borderline  160-189  mg/dL   High  >190     mg/dL   Very High Performed at [facility] [address] [place], [place] [address]   Glucose, capillary     Status: Abnormal   Collection Time: 09/29/17  7:47 AM  Result Value Ref Range   Glucose-Capillary 264 (H) 70 - 99 mg/dL  Glucose, capillary     Status: Abnormal   Collection Time: 09/29/17 12:13 PM  Result Value Ref Range   Glucose-Capillary 324 (H) 70 - 99 mg/dL    Ct Head Wo Contrast  Result Date: 09/28/2017 CLINICAL DATA:  Altered level of consciousness EXAM: CT HEAD WITHOUT CONTRAST TECHNIQUE: Contiguous axial images were obtained from the base of the skull through the vertex without intravenous contrast. COMPARISON:  01/30/2010 FINDINGS: Brain: No evidence of acute infarction, hemorrhage, hydrocephalus, extra-axial collection or mass lesion/mass effect. Vascular: No hyperdense vessel or unexpected calcification. Skull: Normal. Negative for fracture or focal lesion. Sinuses/Orbits: No acute finding. Other: None. IMPRESSION: Normal head CT Electronically Signed   By: Inez Catalina M.D.   On: 09/28/2017 12:05   Dg Chest Port 1 View  Result Date: 09/28/2017 CLINICAL DATA:  Chest pain EXAM:  PORTABLE CHEST 1 VIEW COMPARISON:  11/11/2015 FINDINGS: The heart size and mediastinal contours are within normal limits. Both lungs are clear. The visualized skeletal structures are unremarkable. IMPRESSION: No active disease. Electronically Signed   By: Inez Catalina M.D.   On: 09/28/2017 08:54   Ct Angio Chest/abd/pel For Dissection W And/or W/wo  Result Date: 09/28/2017 CLINICAL DATA:  Chest and back pain EXAM: CT ANGIOGRAPHY CHEST, ABDOMEN AND PELVIS TECHNIQUE: Multidetector CT imaging through the chest, abdomen and pelvis was performed using the standard protocol during bolus administration of intravenous contrast. Multiplanar reconstructed images  and MIPs were obtained and reviewed to evaluate the vascular anatomy. CONTRAST:  100 mL Isovue 370. COMPARISON:  None. FINDINGS: CTA CHEST FINDINGS Cardiovascular: Thoracic aorta is well visualized with mild calcification at the level of the aortic valve. No aneurysmal dilatation or dissection is seen. Mild coronary calcifications are noted. No cardiac enlargement is seen. The pulmonary artery as visualized is within normal limits although not timed for pulmonary embolus evaluation. Mediastinum/Nodes: Thoracic inlet is within normal limits. No hilar or mediastinal adenopathy is noted. The esophagus is unremarkable. Lungs/Pleura: Lungs are well aerated bilaterally. Mild patchy atelectatic changes are noted without focal confluent infiltrate. No sizable effusion or pneumothorax is seen. Musculoskeletal: No acute bony abnormality is noted. Degenerative change of the thoracic spine is seen. Review of the MIP images confirms the above findings. CTA ABDOMEN AND PELVIS FINDINGS VASCULAR Aorta: Abdominal aorta is well visualize without evidence of aneurysmal dilatation or dissection. No significant atherosclerotic changes are seen. Celiac: Patent without evidence of aneurysm, dissection, vasculitis or significant stenosis. SMA: Patent without evidence of aneurysm, dissection, vasculitis or significant stenosis. Renals: Single renal arteries are noted bilaterally. No focal stenosis is seen. IMA: Patent without evidence of aneurysm, dissection, vasculitis or significant stenosis. Iliacs: Within normal limits. Veins: No venous abnormality is seen. Review of the MIP images confirms the above findings. NON-VASCULAR Hepatobiliary: Mild fatty infiltration of the liver is noted. The gallbladder is well distended with gallstones within. No pericholecystic fluid or inflammatory changes are seen. Pancreas: Unremarkable. No pancreatic ductal dilatation or surrounding inflammatory changes. Spleen: Normal in size without focal  abnormality. Adrenals/Urinary Tract: The adrenal glands are within normal limits. Kidneys are well visualized bilaterally. No renal calculi or obstructive changes are seen. The bladder is well distended. Stomach/Bowel: No obstructive or inflammatory changes are identified. The appendix is not well visualized. No inflammatory changes to suggest appendicitis are noted. Lymphatic: No significant lymphadenopathy is seen. Reproductive: Prostate is unremarkable. Other: No abdominal wall hernia or abnormality. No abdominopelvic ascites. Musculoskeletal: No acute or significant osseous findings. Review of the MIP images confirms the above findings. IMPRESSION: No evidence of aneurysmal dilatation or dissection. No sizable pulmonary emboli are seen. Mild patchy atelectatic changes are noted. Cholelithiasis without complicating factors. Electronically Signed   By: Inez Catalina M.D.   On: 09/28/2017 12:20    Review of Systems  Constitutional: Negative for chills and fever.  HENT: Negative.   Respiratory: Positive for shortness of breath. Negative for cough.   Cardiovascular: Positive for chest pain (Currently resolved) and leg swelling. Negative for claudication and PND.  Gastrointestinal: Negative for nausea and vomiting.  Genitourinary: Negative.   Musculoskeletal:       Foot pain, neuropathy. Unable to drive  Skin: Negative.   Neurological: Negative for dizziness and loss of consciousness.       Confusion, now improved  Endo/Heme/Allergies: Does not bruise/bleed easily.  Psychiatric/Behavioral: Negative.   All other systems reviewed  and are negative.  Blood pressure 138/90, pulse 80, temperature 98.2 F (36.8 C), resp. rate 17, height 5' 8"  (1.727 m), weight 123 kg (271 lb 2.7 oz), SpO2 100 %. Physical Exam  Nursing note and vitals reviewed. Constitutional: He is oriented to person, place, and time. He appears well-developed and well-nourished.  HENT:  Head: Normocephalic and atraumatic.  Eyes:  Pupils are equal, round, and reactive to light. Conjunctivae are normal.  Neck: Normal range of motion. Neck supple. No JVD present.  Cardiovascular: Normal rate, regular rhythm, normal heart sounds and intact distal pulses.  No murmur heard. Respiratory: Effort normal and breath sounds normal. He has no wheezes. He has no rales.  GI: Soft. Bowel sounds are normal. He exhibits distension. There is no tenderness. There is no rebound.  Musculoskeletal: Normal range of motion. He exhibits edema (2+ b/l).  Lymphadenopathy:    He has no cervical adenopathy.  Neurological: He is alert and oriented to person, place, and time. He has normal reflexes.  Skin: Skin is warm and dry.     Cardiac studies: EKG 09/29/2017: Sinus rhythm 80 bpm. Normal axis. Normal conduction. Possible old inferior and anteroseptal infarct. Lateral nonspecific T wave inversions  Echocardiogram 09/29/2017: Study Conclusions  - Left ventricle: There was moderate concentric hypertrophy.   Systolic function was normal. The estimated ejection fraction was   in the range of 55% to 60%. Wall motion was normal; there were no   regional wall motion abnormalities. Doppler parameters are   consistent with abnormal left ventricular relaxation (grade 1   diastolic dysfunction). - Aortic valve: Moderate calcification and restricted movement of   left and right coronary cusp. Moderate aortic stenosis. AVA 1.3   cm2 by continuity. Mean PG 14, Vmax 2.6 m/sec. No significant   aortic regurgitation. Valve area (Vmax): 1.24 cm^2. - Tricuspid valve: There was mild-moderate regurgitation. Estimated   PASP 35-40 mmHg. - Pulmonary arteries: PA peak pressure: 37 mm Hg (S).   Assessment/Recommendations:  57 y/o [place] American male with hypertension, type 2 DM, depression, bipolar disorder, h/o cocaine abuse, former smoker.  Chest pain: Central. Atypical, and very likely related to cocaine use. However, he has CAD risk factors.  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 Recommend follow up echocardiogram every 1-2 years  Hypertension: Suboptimal. Resume benazepril. Consider adding amlodipine  Type 2 DM: Management per the primary team  Cocaine abuse: Cautioned the patient against any use of cocaine. Recommend substance abuse counseling.  Depression, bipolar disorder: Management per the primary team  Shadow Schedler J Harvie Morua 09/29/2017, 12:30 PM   Kore Madlock Esther Hardy, MD [facility] Pager: [phone] Office: [phone] If no answer Cell [phone]

## 2017-09-29 NOTE — Progress Notes (Signed)
   Subjective: Visited and evaluated the patient due to complaining of chest pain. He has had chest pain since 3 days ago. And mentions that it got worse in past hour. Breathing does not change the pain. Raising his arm makes the pain better. Report some shortness of breath since past couple of days. He can point the area that is painful on upper left chest. He reports some partial improvement with sublingual Nitro  Objective:    Vital signs in last 24 hours: Vitals:   09/28/17 1400 09/28/17 1726 09/28/17 1900 09/29/17 0300  BP: (!) 155/102 (!) 168/99 121/75 129/82  Pulse:  97 88 79  Resp: 20 (!) 24 15 13   Temp:  98.6 F (37 C) 98.9 F (37.2 C) 97.6 F (36.4 C)  TempSrc:  Oral Oral Oral  SpO2:  97% 97% 97%  Weight:  271 lb 2.7 oz (123 kg)    Height:  5\' 8"  (1.727 m)     Vitals are stable. Alert and oriented and in no acute distress. While examined the chest, the pain was reproducible on exam.  Assessment/Plan:  Mr. Celmer is a 57yo male with hx of type 2 DM, HTN, systolic CHF (5789 EF 78%), drug abuse, suicide attempt, SI and MDD who presented with weakness and confusion for the past three days and new onset chest pain. Patient also fell while walking to the ED today.  Paged for chest pain:  -Chest pain:  Localized, reproducible pain on exam. Does not seem cardiac. Previous EKG today did not show acute ST-T change. Troponin has not been significantly elevated (0.03 x2), order for trending Troponin already placed by day team.   -EKG--> No acute ST-T change. Similar to previous EKG -Follow up Troponin result next morning    Dewayne Hatch, MD 09/29/2017, 4:39 AM Pager: [phone]

## 2017-09-29 NOTE — Progress Notes (Signed)
  Echocardiogram 2D Echocardiogram has been performed.  Bobbye Charleston 09/29/2017, 9:34 AM

## 2017-09-29 NOTE — Progress Notes (Signed)
Internal Medicine Attending  Date: 09/29/2017  Patient name: [name] Medical record number: [id number] Date of birth: 12/11/1960 Age: 57 y.o. Gender: male  I saw and evaluated the patient. I reviewed the resident's note by Dr. Sharon Seller and I agree with the resident's findings and plans as documented in her progress note.  Please see my H&P dated 09/29/2017 for the specifics of my evaluation, assessment, plan from earlier in the day.

## 2017-09-29 NOTE — H&P (Signed)
Internal Medicine Attending Admission Note Date: 09/29/2017  Patient name: [name] Medical record number: [id number] Date of birth: January 25, 1961 Age: 57 y.o. Gender: male  I saw and evaluated the patient. I reviewed the resident's note and I agree with the resident's findings and plan as documented in the resident's note.  Chief Complaint(s): Chest pain  History - key components related to admission:  [name] is a 57 year old man with a history of diabetes, hypertension, obesity, cocaine abuse, prior tobacco abuse, bipolar disorder, and depression who presents with chest pain that radiates to both arms. Of note, he has dyspnea on exertion, lower extremity edema, and recent cocaine abuse. With associated generalized weakness he presented to the emergency department at the urging of his wife. He was admitted to the internal medicine teaching service for further evaluation and care.  Physical Exam - key components related to admission:  Vitals:   09/29/17 0300 09/29/17 0750 09/29/17 0800 09/29/17 1215  BP: 129/82 138/90  (!) 143/92  Pulse: 79 80  83  Resp: 13 17  13   Temp: 97.6 F (36.4 C)  98.2 F (36.8 C)   TempSrc: Oral     SpO2: 97% 100%  99%  Weight:      Height:       Gen.: Well-developed, well-nourished, man lying comfortably in bed at about 20 of head elevation in no acute distress. Lungs: Clear to auscultation bilaterally. Heart: Regular rate and rhythm with a 2/6 systolic murmur best heard at the left sternal border radiating to the apex of the heart. Chest wall: Chest pain reproducible with palpation of the anterior chest wall. Abdomen: Soft, nontender, without guarding or rebound. Extremities: 2+ pitting edema to just below the knees bilaterally.  Lab results:  Basic Metabolic Panel: Recent Labs    09/28/17 0820 09/28/17 0842 09/29/17 0724  NA 133* 132* 132*  K 4.2 4.2 3.5  CL 99 101 102  CO2 21*  --  22  GLUCOSE 494* 507* 264*  BUN 33* 37* 22*  CREATININE  1.45* 1.30* 0.89  CALCIUM 9.2  --  7.8*   Liver Function Tests: Recent Labs    09/28/17 0820  AST 23  ALT 24  ALKPHOS 57  BILITOT 0.8  PROT 7.2  ALBUMIN 3.4*   CBC: Recent Labs    09/28/17 0820 09/28/17 0842 09/29/17 0724  WBC 4.9  --  3.6*  NEUTROABS 2.7  --   --   HGB 12.9* 15.0 10.9*  HCT 39.9 44.0 33.9*  MCV 81.4  --  80.9  PLT 201  --  156   Cardiac Enzymes: Recent Labs    09/28/17 1918 09/29/17 0129 09/29/17 0724  TROPONINI 0.03* 0.03* 0.03*   CBG: Recent Labs    09/28/17 1208 09/28/17 1532 09/28/17 1728 09/28/17 2158 09/29/17 0747 09/29/17 1213  GLUCAP 359* 366* 343* 219* 264* 324*   Hemoglobin A1C: Recent Labs    09/28/17 0820  HGBA1C 13.1*   Fasting Lipid Panel: Recent Labs    09/29/17 0724  CHOL 208*  HDL 39*  LDLCALC 153*  TRIG 81  CHOLHDL 5.3   Urine Drug Screen:  Positive for cocaine Negative for opiates, benzodiazepines, and vitamins, THC, and barbiturates  Misc. Labs:  HIV antibody nonreactive Urine sodium 12  Imaging results:   AP portable chest x-ray: Personally reviewed. Elevated hemidiaphragm with transverse colon at the splenic flexure underneath the left hemidiaphragm. No effusions, infiltrates, or masses.  PA and lateral chest x-ray: Personally reviewed. No effusions,  infiltrates, or masses.  Head CT without contrast: Personally reviewed. No acute bleed.  CT chest angiography: Personally reviewed. No parenchymal pulmonary disease, no mediastinal adenopathy, no central pulmonary emboli.  Other results:  EKG: Personally reviewed. Normal sinus rhythm at 89 bpm, normal axis, normal intervals, significant Q wave in V1, no LVH by voltage, delayed R wave progression, half millimeter ST elevation in V1-V3, lateral biphasic T waves. No significant changes from the previous ECG on 07/31/2017 or the subsequent ECG on 09/29/2017.  Echocardiogram: Left ventricular ejection fraction 56-43%, grade 1 diastolic dysfunction,  moderate aortic stenosis, mild pulmonary hypertension.  Assessment & Plan by Problem:  [name] is a 57 year old man with a history of diabetes, hypertension, obesity, cocaine abuse, prior tobacco abuse, bipolar disorder, and depression who presents with chest pain that radiates to both arms.  His chest pain is atypical and reproducible on physical examination with palpation. That said, he has multiple coronary risk factors including concurrent cocaine abuse. Thus, he deserves further risk stratification via a stress test. As he has difficulty walking without assistance a pharmacologic stress test would be the best risk stratifying modality. If he is found to have no significant reversible disease I suspect his mild troponemia and chest pain may be related to the concurrent cocaine abuse.  1) Atypical chest pain with multiple cardiac risk factors: We will obtain a Lexiscan to risk stratify and assess for reversible disease.  He will be placed on aspirin and a high intensity statin. We are currently holding beta blocker therapy given his concurrent cocaine abuse. Further evaluation and intervention are pending the results of the [name]. Regardless, we will counsel him on the importance of abstaining from cocaine given its negative effects on the heart.  2) Disposition: Pending the results of the [name] and need for further evaluation and/or intervention. If the Lexiscan does not demonstrate reversible disease, he will be stable for discharge home tomorrow to follow up with his primary care provider for further assessment of what would then be noncardiac chest pain.

## 2017-09-29 NOTE — Discharge Summary (Signed)
Name: [name] MRN: [id number] DOB: 1960/12/15 57 y.o. PCP: [facility]  Date of Admission: 09/28/2017  8:08 AM Date of Discharge:  Attending Physician: Oval Linsey, MD  Discharge Diagnosis: 1. Atypical Chest Pain 2. Hypertension 3. Hyperlipidemia 4. Drug Abuse  Discharge Medications: Allergies as of 10/01/2017      Reactions   Other Anaphylaxis   Allergy to GRITS , unknown exactly the specific ingredient he is allergic too      Medication List    STOP taking these medications   traZODone 100 MG tablet Commonly known as:  DESYREL     TAKE these medications   amLODipine 5 MG tablet Commonly known as:  NORVASC Take 1 tablet (5 mg total) by mouth daily.   aspirin 81 MG EC tablet Take 1 tablet (81 mg total) by mouth daily. Start taking on:  10/02/2017   atorvastatin 40 MG tablet Commonly known as:  LIPITOR Take 0.5 tablets (20 mg total) by mouth daily at 6 PM.   benazepril 10 MG tablet Commonly known as:  LOTENSIN Take 1 tablet (10 mg total) by mouth daily. Start taking on:  10/02/2017   buPROPion 300 MG 24 hr tablet Commonly known as:  WELLBUTRIN XL Take 1 tablet (300 mg total) by mouth daily at 6 PM.   gabapentin 300 MG capsule Commonly known as:  NEURONTIN Take 2 capsules (600 mg total) by mouth 3 (three) times daily.   metFORMIN 1000 MG tablet Commonly known as:  GLUCOPHAGE Take 1 tablet (1,000 mg total) by mouth 2 (two) times daily with a meal. For diabetes control   TRESIBA FLEXTOUCH 100 UNIT/ML Sopn FlexTouch Pen Generic drug:  insulin degludec Inject 40 Units into the skin every evening.       Disposition and follow-up:   Mr.[name] was discharged from [facility] in Stable condition.  At the hospital follow up visit please address:  1.  Atypical Chest Pain: Stress test w/nml wall motion. EF 48%. Troponins .03x3. UDS was positive for cocaine. Echo wnl but showed some aortic stenosis. Cardiology  recommended echo every 1-2 years for possible fruste aortic valve.   - Drug Abuse: +UDS for cocaine. Discussed his desire to quit extensively. He feels like he does not have a great support system. Resources provided and he is enthusiastic about stopping, especially in light of discussing health ramifications of cocaine to his heart. - HTN: patient not previously taking medications besides wellbutrin & diabetes meds. Lisinopril restarted, Amlodipine 5mg  qd started. No beta blocker due to cocaine use.  - Hyperlipidemia: increased ASCVD risk: asa 81qd and atorvastatin 20mg  qd started  - TIIDM: Patient states glucose runs high. <120 he feels shaky, but concern he may benefit from increased therapy.   2.  Labs / imaging needed at time of follow-up: glucose   3.  Pending labs/ test needing follow-up: none  Follow-up Appointments: Follow-up Information    [name], [name], MD. Call.   Specialty:  Cardiology Contact information: [place] [address] [phone]        [facility] Follow up on 10/15/2017.   Specialty:  Family Medicine Why:  Dr Eula Fried   10:15  Please bring all medications you are taking and copy of your discharge instructions to your appointment. Contact information: [address] [place] [address] [place] Hospital Course by  problem list: [name] is a 57yo male with hx of TIIDM with neuropathy, HTN, systolic CHF (5361 EF 44%) drug abuse, suicide attempt, SI and MDD who presented with weakness and confusion for three days and recent onset chest pain that changed depending on position. Patient also fell while walking to the ED but had no LOC, head injury or trauma. When seen in the ED he was alert and oriented and had no signs of confusion. He was found to have a mild AKI, likely secondary to dehydration and was bolused in the ED with resolution on LOS day 2. He has a history of chest pain  with cocaine use. He stated on admission he had not done cocaine since May but had a positive urine drug screen. He had not been taking medications for high blood pressure. Follow-up EKG also showed acute T wave changes. In light of troponins at .03x3, chest pain, and increased risk factors, cardiology was consulted for a stress test. An echo was also done and showed normal systolic function, grade 1 diastolic function and some moderate aortic stenosis, which cardiology felt may be a possible bicuspid aortic valve. Follow-up every 1-2 years with Echo was recommended. These findings were discussed with the patient. His stress showed no ischemia or infarct but mild decrease in systolic function with EF of 48%. We stressed the importance of medication compliance in the future, and restarted his benazepril. Amlodipine 5mg  qd was also started for his hypertension. Due to his increased ASCVD risk, he will be sent home with asa 81 and atorvastatin as well.  [name] displayed some distress in going home alone and was worried about continued cocaine use with his lack of home support system. We discussed outpatient options, NA meetings in the area and provided resources for him to contact. We also stressed the importance of refraining from cocaine use, which could damage his heart further. After time of discharge, he expressed the desire to go to inpatient rehab at [facility] in [place], a rehabilitation center had had been to before. Social work has helped to arrange for this to happen tomorrow, with transportation, once he is discharged to home.      Discharge Vitals:   BP (!) 159/91 (BP Location: Left Arm)   Pulse 78   Temp 98.4 F (36.9 C) (Oral)   Resp 16   Ht 5\' 8"  (1.727 m)   Wt 271 lb 2.7 oz (123 kg)   SpO2 99%   BMI 41.23 kg/m   Pertinent Labs, Studies, and Procedures:    Echocardiogram 09/29/2017: Study Conclusions  - Left ventricle: There was moderate concentric hypertrophy. Systolic  function was normal. The estimated ejection fraction was in the range of 55% to 60%. Wall motion was normal; there were no regional wall motion abnormalities. Doppler parameters are consistent with abnormal left ventricular relaxation (grade 1 diastolic dysfunction). - Aortic valve: Moderate calcification and restricted movement of left and right coronary cusp. Moderate aortic stenosis. AVA 1.3 cm2 by continuity. Mean PG 14, Vmax 2.6 m/sec. No significant aortic regurgitation. Valve area (Vmax): 1.24 cm^2. - Tricuspid valve: There was mild-moderate regurgitation. Estimated PASP 35-40 mmHg. - Pulmonary arteries: PA peak pressure: 37 mm Hg (S).   Stress Test 10/01/17:  1. No reversible ischemia or infarction. 2. Normal left ventricular wall motion. 3. Left ventricular ejection fraction 48% 4. Non invasive risk stratification*: Low risk *2012 Appropriate Use Criteria for Coronary Revascularization Focused Update: J Am Coll Cardiol. 3154;00(8):676-195. http://content.airportbarriers.com.aspx?articleid=1201161  Electronically Signed  By: Marijo Sanes M.D.   On: 09/30/2017 15:23   Discharge Instructions: Discharge Instructions    Diet - low sodium heart healthy   Complete by:  As directed    Discharge instructions   Complete by:  As directed    FOLLOW-UP INSTRUCTIONS Please follow-up with [facility]  When: August 21st at 10:15 am What to bring: all of your medication bottles    You were hospitalized for Chest pain and altered mental status. Thank you for allowing [place] to be part of your care.   We arranged for you to follow up at [facility]. Please don't hesitate to call if this doesn't work for your schedule.   Please note these changes made to your medications:   For your high blood pressure:  Amlodipine (NORVASC) 5mg  tablet - please take one tablet daily by mouth Benazepril (LOTENSIN) 10mg  tablet - please take one tablet daily by   Mouth.   For you high cholesterol:  Aspirin 81mg  tablet - please take one table daily by mouth Atorvastatin (LIPITOR) 40mg  tablet - please take 1/2 tablet daily by mouth  Please call our clinic if you have any questions or concerns, we may be able to help and keep you from a long and expensive emergency room wait. Our clinic and after hours phone number is [phone], the best time to call is Monday through Friday 9 am to 4 pm but there is always someone available 24/7 if you have an emergency.   Increase activity slowly   Complete by:  As directed       Signed: Molli Hazard A, DO 10/01/2017, 2:39 PM   Pager: [phone]

## 2017-09-29 NOTE — Progress Notes (Addendum)
Subjective: Mr. Thorstenson continues to have chest pain this morning that changes with movement and increases with palpation. He states he has some SOB but this is normal for him. He denie nausea, numbness or jaw pain.   Objective:  Vital signs in last 24 hours: Vitals:   09/29/17 0300 09/29/17 0750 09/29/17 0800 09/29/17 1215  BP: 129/82 138/90  (!) 143/92  Pulse: 79 80  83  Resp: 13 17  13   Temp: 97.6 F (36.4 C)  98.2 F (36.8 C)   TempSrc: Oral     SpO2: 97% 100%  99%  Weight:      Height:       Physical Exam  Constitution: NAD, lying supine in bed, obese, appears stated age HENT: atraumatic, normocephalic Eyes: no scleral icterus, EOM intact Cardio: RRR, systolic murmur, negative for rubs, gallops, +1 pitting edema Respiratory: Clear to auscultation bilaterally  Abdominal: +BS, TTP diffusely, non-distended, soft MSK: strength 5/5, symmetric, sensations intact Neuro: A&Ox3, pleasant, cooperative Skin: clean, dry, and intact     Echocardiogram 09/29/2017: Study Conclusions  - Left ventricle: There was moderate concentric hypertrophy. Systolic function was normal. The estimated ejection fraction was in the range of 55% to 60%. Wall motion was normal; there were no regional wall motion abnormalities. Doppler parameters are consistent with abnormal left ventricular relaxation (grade 1 diastolic dysfunction). - Aortic valve: Moderate calcification and restricted movement of left and right coronary cusp. Moderate aortic stenosis. AVA 1.3 cm2 by continuity. Mean PG 14, Vmax 2.6 m/sec. No significant aortic regurgitation. Valve area (Vmax): 1.24 cm^2. - Tricuspid valve: There was mild-moderate regurgitation. Estimated PASP 35-40 mmHg. - Pulmonary arteries: PA peak pressure: 37 mm Hg (S).    Assessment/Plan:  Principal Problem:   Uncontrolled diabetes mellitus ([place]) Active Problems:   Hyperglycemia   Nonobstructive atherosclerosis of coronary  artery   Chronic HFrEF (heart failure with reduced ejection fraction) ([place])  57yo male with hx of TIIDM with neuropathy, HTN, systolic CHF (7124 EF 58%) drug abuse, suicide attempt, SI and MDD who presented with weakness and confusion for the past three days and new onset chest pain. Patient also fell while walking to the ED. He states he did not hit his head or have LOC.   #AMS secondary to dehydration: A&Ox3 on exam yesterday. UDS pos for cocaine, UA negative.   - am BMP   #Atypical Chest pain: Troponin 0.03x3. +UDS positive for cocaine use. Cardic cath 2015 with [facility] with insignificant findings. Described pain as sharp, stabbing pain that changes with movement.  - cardiology consulted, lexiscan stress test in the am - NPO at midnight   #Grade 1 diastolic CHF:  Echo today with moderate aortic stenosis, EF: 55-60%. Possible bicuspid aortic valve per cards.  Last echo 2015, EF 40%.   - f/u echo every 1-2 years  - beta blocker not suggested due to cocaine abuse   #HTN: per patient not on any medications   - restart previous lisinopril once AKI improves   #TIIDM: blood glucose 465 on admission. Normally on 1000mg  metformin bid & Tresiba 40U qhs. HbA1c 13.1  - Lantus 40U qd - increased to novolog 4U tid qc & SSI - CBG monitoring  #AKI: Cr. likely secondary to dehydration. 1.47 --> 1.3. 2500cc bolus in ED  - am BMP   #Hyponatremia: likely secondary to elevations in blood glucose, 132. Urine Na 12  - am BMP  #MDD: Hx of SI & suicide attempts. Pt takes wellbutrin for depression  -  cont. wellbutrin  - telesitter due to high risk on nursing assessment but no active thoughts of harming himself.   IVF: nonoe  VTE ppx: LVX  Dispo: Anticipated discharge in approximately two days.   Marty Heck, DO 09/29/2017, 2:48 PM Pager: [phone]

## 2017-09-29 NOTE — Progress Notes (Signed)
Pt c/o chest pain with increasing pain while breathing, PRN nitro given, Intern was notified. within a few minutes pt states "he feels better", Intern aware and no orders for EKG at this time.  WCTM

## 2017-09-30 ENCOUNTER — Observation Stay (HOSPITAL_COMMUNITY): Payer: Medicare Other

## 2017-09-30 DIAGNOSIS — I5042 Chronic combined systolic (congestive) and diastolic (congestive) heart failure: Secondary | ICD-10-CM

## 2017-09-30 DIAGNOSIS — E785 Hyperlipidemia, unspecified: Secondary | ICD-10-CM

## 2017-09-30 DIAGNOSIS — R0789 Other chest pain: Secondary | ICD-10-CM | POA: Diagnosis not present

## 2017-09-30 DIAGNOSIS — I110 Hypertensive heart disease with heart failure: Secondary | ICD-10-CM | POA: Diagnosis not present

## 2017-09-30 DIAGNOSIS — F339 Major depressive disorder, recurrent, unspecified: Secondary | ICD-10-CM

## 2017-09-30 LAB — BASIC METABOLIC PANEL
Anion gap: 8 (ref 5–15)
BUN: 11 mg/dL (ref 6–20)
CO2: 26 mmol/L (ref 22–32)
Calcium: 8.2 mg/dL — ABNORMAL LOW (ref 8.9–10.3)
Chloride: 100 mmol/L (ref 98–111)
Creatinine, Ser: 0.95 mg/dL (ref 0.61–1.24)
GFR calc Af Amer: >60 mL/min (ref >60)
GFR calc non Af Amer: >60 mL/min (ref >60)
Glucose, Bld: 305 mg/dL — ABNORMAL HIGH (ref 70–99)
Potassium: 3.6 mmol/L (ref 3.5–5.1)
Sodium: 134 mmol/L — ABNORMAL LOW (ref 135–145)

## 2017-09-30 LAB — CBC
HCT: 35.1 % — ABNORMAL LOW (ref 39.0–52.0)
Hemoglobin: 11.5 g/dL — ABNORMAL LOW (ref 13.0–17.0)
MCH: 26.4 pg (ref 26.0–34.0)
MCHC: 32.8 g/dL (ref 30.0–36.0)
MCV: 80.7 fL (ref 78.0–100.0)
Platelets: 153 10*3/uL (ref 150–400)
RBC: 4.35 MIL/uL (ref 4.22–5.81)
RDW: 12.9 % (ref 11.5–15.5)
WBC: 3.5 10*3/uL — ABNORMAL LOW (ref 4.0–10.5)

## 2017-09-30 LAB — GLUCOSE, CAPILLARY: Glucose-Capillary: 273 mg/dL — ABNORMAL HIGH (ref 70–99)

## 2017-09-30 MED ORDER — TRAMADOL HCL 50 MG PO TABS
50.0000 mg | ORAL_TABLET | Freq: Once | ORAL | Status: AC
  Administered 2017-09-30: 50 mg via ORAL
  Filled 2017-09-30: qty 1

## 2017-09-30 MED ORDER — REGADENOSON 0.4 MG/5ML IV SOLN
INTRAVENOUS | Status: AC
  Administered 2017-09-30: 12:00:00
  Filled 2017-09-30: qty 5

## 2017-09-30 MED ORDER — ATORVASTATIN CALCIUM 40 MG PO TABS: 40.0000 mg | ORAL_TABLET | Freq: Every day | ORAL | Status: DC

## 2017-09-30 MED ORDER — TECHNETIUM TC 99M TETROFOSMIN IV KIT
10.0000 | PACK | Freq: Once | INTRAVENOUS | Status: AC | PRN
  Administered 2017-09-30: 10 via INTRAVENOUS

## 2017-09-30 MED ORDER — REGADENOSON 0.4 MG/5ML IV SOLN
0.4000 mg | Freq: Once | INTRAVENOUS | Status: AC
  Administered 2017-09-30: 0.4 mg via INTRAVENOUS
  Filled 2017-09-30: qty 5

## 2017-09-30 MED ORDER — TECHNETIUM TC 99M TETROFOSMIN IV KIT: 30.0000 | PACK | Freq: Once | INTRAVENOUS | Status: DC | PRN

## 2017-09-30 NOTE — Progress Notes (Signed)
Nurse received a phone call from nuclear medicine, patient is having pain in his back and is currently unable to complete because of pain.   Tylenol is the only prn pain medication on the chart.   Only Physician listed for patient is Oval Linsey, MD.  Hulen Skains [phone] and was given Dr. Sharon Seller as first line of defense.   Paged Dr. Sharon Seller and she is putting in an order for tramadol.   Nurse will pull the tramadol and some ativan and take it to patient in nuclear medicine.

## 2017-09-30 NOTE — Progress Notes (Signed)
Inpatient Diabetes Program Recommendations  AACE/ADA: New Consensus Statement on Inpatient Glycemic Control (2015)  Target Ranges:  Prepandial:   less than 140 mg/dL      Peak postprandial:   less than 180 mg/dL (1-2 hours)      Critically ill patients:  140 - 180 mg/dL   Lab Results  Component Value Date   GLUCAP 213 (H) 09/29/2017   HGBA1C 13.1 (H) 09/28/2017    Spoke with patient about diabetes and home regimen for diabetes control. Patient reports that he is followed by his PCP for DM management and currently takes [place] 40 units, in addition to Lantus if glucose is over 200, and Metformin 1000 mg BID. Patient reports moving back from [place] in May of this year. Patient reports feeling low if below 120 mg/dl. Discussed patient's A1c results and importance of getting A1c down. Patient is not going to let himself "feel low."  Patient reports that they taking insulin as prescribed. Discussed with patient about the need for adjustments. Discussed glucose and A1C goals. Discussed importance of checking CBGs and maintaining good CBG control to prevent long-term and short-term complications. Patient verbalized understanding of information discussed and he states that he has no further questions at this time related to diabetes.   Thanks,  Tama Headings RN, MSN, [facility] Inpatient Diabetes Coordinator Team Pager [phone] (8a-5p)

## 2017-09-30 NOTE — Progress Notes (Signed)
Pt off the unit to nuclear for a stress test.

## 2017-09-30 NOTE — Progress Notes (Signed)
Internal Medicine Attending  Date: 09/30/2017  Patient name: [name] Medical record number: [id number] Date of birth: [birth year] Age: 57 y.o. Gender: male  I saw and evaluated the patient. I reviewed the resident's note by Dr. Danford Bad and I agree with the resident's findings and plans as documented in her progress note.  I saw Mr. Gauer after his stress test. The cardiovascular risk was low as there was no reversible ischemia or evidence of infarction. His ejection fraction was minimally decreased at 48%. The importance of cocaine cessation was again reinforced. We will provide him with resources as his life depends upon it. He will be discharged home in the morning with follow-up in his primary care provider's office.

## 2017-09-30 NOTE — Progress Notes (Signed)
   Subjective: [name] was seen and evaluated after his stress test this afternoon. He denies any further chest pain and feels well. He expresses a lot of apprehension about going to an "empty" house and is worried he will start using cocaine to help with the loneliness. He requests information on resources in the area to help with his substance use and avidly requests staying to talk with social work.   Objective:  Vital signs in last 24 hours: Vitals:   09/29/17 1703 09/29/17 2003 09/29/17 2220 09/30/17 0300  BP: (!) 144/85 (!) 172/107 (!) 144/86 (!) 152/94  Pulse: (!) 40 84 83 81  Resp: 19 15 16 16   Temp:  97.9 F (36.6 C) 98.1 F (36.7 C) 98.5 F (36.9 C)  TempSrc:  Axillary Oral Oral  SpO2: 92% 97% 95% 99%  Weight:      Height:       Physical Exam  Constitution:NAD, sitting in bedside chair, obese, appears stated age HENT:atraumatic, normocephalic Eyes:no scleral icterus, EOM intact Cardio:RRR, systolic murmur, negative for rubs, gallops, mild peripheral edema Respiratory:Clear to auscultation bilaterally Abdominal:+BS, Not tender. non-distended, soft Neuro:A&Ox3, pleasant, cooperative Skin:clean, dry, and intact    Assessment/Plan:  Principal Problem:   Uncontrolled diabetes mellitus ([place]) Active Problems:   Hyperglycemia   Nonobstructive atherosclerosis of coronary artery   Chronic HFrEF (heart failure with reduced ejection fraction) ([place])   Pure hypercholesterolemia  57yo male with hx of TIIDM with neuropathy, HTN, systolic CHF (9371 EF 69%) drug abuse, suicide attempt, SI and MDD who presented with weakness and confusion for the past three days and new onset chest pain. Patient also fell while walking to the ED. He states he did not hit his head or have LOC.   #AtypicalChest pain #HLD #HTN #Hx of Tobacco use #Cocaine use Stress test today negative for indications of ischemia. Patient remains without chest pain but has significant anxiety and  apprehension about dc home due to c/o cocaine use. Will work on finding patient some resources for substance use disorder prior to his discharge tomorrow.  -ASA daily -Tolerating ACE-I so far -Atorvastatin 40mg  daily -Advised strongly against future cocaine use; will consult SW to help with resources  #Combined CHF #AS Patient with moderately reduced EF. Needs follow-up with cardiology re: AS and follow-up echos Q1-2 yrs per cards note yesterday - beta blocker not suggested due to cocaine abuse   #TIIDM:A1c 13%. - Lantus 40U qd - Novolog 4U tid qc & SSI - CBG monitoring  #AKI:Improved.   #MDD: Hx of SI & suicide attempts, no current ideations.Pt takes wellbutrin for depression  - cont. wellbutrin - telesitter d  IVF: nonoe  VTE ppx: LVX   Dispo: Anticipated discharge tomorrow.   Seawell, Jaimie A, DO 09/30/2017, 6:50 AM Pager: [phone]

## 2017-09-30 NOTE — Progress Notes (Signed)
Stress test findings noted. No ischemia/infarction. EF mildly reduced 48%. No indication for coronary angiography. Needs risk factor management for hypertension, DM Absolutely needs to abstain from cocaine abuse. Recommend outpatient follow up for mod AS  Nigel Mormon, MD [facility] Pager: [phone] Office: [phone] If no answer Cell [phone]

## 2017-10-01 DIAGNOSIS — E860 Dehydration: Secondary | ICD-10-CM | POA: Diagnosis not present

## 2017-10-01 DIAGNOSIS — Z9109 Other allergy status, other than to drugs and biological substances: Secondary | ICD-10-CM

## 2017-10-01 DIAGNOSIS — Z7982 Long term (current) use of aspirin: Secondary | ICD-10-CM

## 2017-10-01 DIAGNOSIS — E1165 Type 2 diabetes mellitus with hyperglycemia: Secondary | ICD-10-CM

## 2017-10-01 DIAGNOSIS — IMO0002 Reserved for concepts with insufficient information to code with codable children: Secondary | ICD-10-CM

## 2017-10-01 DIAGNOSIS — R0789 Other chest pain: Secondary | ICD-10-CM | POA: Diagnosis not present

## 2017-10-01 DIAGNOSIS — E1140 Type 2 diabetes mellitus with diabetic neuropathy, unspecified: Secondary | ICD-10-CM | POA: Diagnosis not present

## 2017-10-01 DIAGNOSIS — E871 Hypo-osmolality and hyponatremia: Secondary | ICD-10-CM | POA: Diagnosis not present

## 2017-10-01 LAB — GLUCOSE, CAPILLARY
Glucose-Capillary: 251 mg/dL — ABNORMAL HIGH (ref 70–99)
Glucose-Capillary: 239 mg/dL — ABNORMAL HIGH (ref 70–99)
Glucose-Capillary: 152 mg/dL — ABNORMAL HIGH (ref 70–99)
Glucose-Capillary: 249 mg/dL — ABNORMAL HIGH (ref 70–99)

## 2017-10-01 MED ORDER — ASPIRIN 81 MG PO TBEC: 81.0000 mg | DELAYED_RELEASE_TABLET | Freq: Every day | ORAL | 3 refills | Status: DC

## 2017-10-01 MED ORDER — ATORVASTATIN CALCIUM 40 MG PO TABS: 20.0000 mg | ORAL_TABLET | Freq: Every day | ORAL | 3 refills | Status: DC

## 2017-10-01 MED ORDER — BENAZEPRIL HCL 10 MG PO TABS: 10.0000 mg | ORAL_TABLET | Freq: Every day | ORAL | 3 refills | Status: DC

## 2017-10-01 MED ORDER — AMLODIPINE BESYLATE 5 MG PO TABS: 5.0000 mg | ORAL_TABLET | Freq: Every day | ORAL | 3 refills | Status: DC

## 2017-10-01 NOTE — Care Management Note (Addendum)
Case Management Note  Patient Details  Name: [name] MRN: [id number] Date of Birth: 03-07-1960  Subjective/Objective:  Pt admitted on 09/28/17 with weakness, confusion and new onset chest pain.  PTA, pt independent, lives alone, per his report.  Pt positive for cocaine on admission.                    Action/Plan: Met with pt this AM to discuss discharge needs.  Pt requesting intensive inpatient drug treatment.  He has been in drug rehab in the past, about 8 years ago, and stayed clean for about a year.  He states he knows he needs help to stop using cocaine; he is worried about the damage it is doing to his heart.  Notified CSW Michiel Cowboy, who will speak to pt about inpatient rehab options.  Follow up appointment made for patient at his PCP for August  21 at 10:15 at [facility].  Appt information put on AVS in Epic. Pt states he does not drive; able to take the bus to appointments.  Will need transportation home today, per his request.  CSW to follow for transportation needs.   Expected Discharge Date:  10/01/17               Expected Discharge Plan:  Home/Self Care  In-House Referral:  Clinical Social Work  Discharge planning Services  CM Consult, Follow-up appt scheduled  Post Acute Care Choice:    Choice offered to:     DME Arranged:    DME Agency:     HH Arranged:    [place] Agency:     Status of Service:  Completed, signed off  If discussed at [employer], dates discussed:    Additional Comments:  Reinaldo Raddle, RN, BSN  Trauma/Neuro ICU Case Manager [phone]

## 2017-10-01 NOTE — Clinical Social Work Note (Signed)
Clinical Social Work Assessment  Patient Details  Name: [name] MRN: [id number] Date of Birth: [birth year]  Date of referral:  10/01/17               Reason for consult:  Insurance Barriers, Financial Concerns, Substance Use/ETOH Abuse                Permission sought to share information with:  Facility Sport and exercise psychologist, Family Supports Permission granted to share information::  Yes, Verbal Permission Granted, Yes, Release of Information Signed  Name::        Agency::  [facility]  Relationship::   rehab facility  Contact Information:   [phone], fax - [phone]  Housing/Transportation Living arrangements for the past 2 months:  [place] of Information:  Patient Patient Interpreter Needed:   None Criminal Activity/Legal Involvement Pertinent to Current Situation/Hospitalization:  No - Comment as needed Significant Relationships:  None Lives with:  Self Do you feel safe going back to the place where you live?  Yes Need for family participation in patient care:  Yes (Comment)  Care giving concerns:  Pt admits to current substance use, and states that he feels impulsive when in the community to use again. Pt states that he would like inpatient treatment for cocaine use. Pt health is strongly affected by this use and he has been counseled on the potential harm if he chooses to continue.    Social Worker assessment / plan:  CSW spoke with pt, pt states that he lives alone here in [place] in his "family home." Pt states that he has multiple concerns related to his current substance use- he is scared he will lose his home, and or his life through use. Pt states that he is interested in inpatient support because he feels as though he is not able to remain compliant to appointments if referred to intensive outpatient.   CSW explained role, referral process, and inability to guarantee inpatient treatment at local facilities due to waiting lists and intake  needs. Pt states understanding but requests CSW send referral to a program called [facility] in [place], pt states he has been there before, it is an inpatient treatment and they provide financial support with getting to and from the treatment program in [place]. CSW explained that this writer would need to confirm with CSW dept that we are able to send referral and that we are unable to hold pt for inpatient treatment bed. Pt states understanding.   Employment status:  Unemployed Forensic scientist:  Medicare PT Recommendations:  Not assessed at this time Information / Referral to community resources:   Inpatient/Intensive Outpatient Substance Use programs  Patient/Family's Response to care:  Pt grateful for resources, however requests specific referral to program in [place].   Patient/Family's Understanding of and Emotional Response to Diagnosis, Current Treatment, and Prognosis:  Pt states understanding of diagnosis, current treatment and prognosis. Pt states understanding of correlation between his cocaine use and potential health outcomes. Pt states emotional distress from the death of his father and sister recently. CSW provided emotional support and active listening as pt described struggle to maintain sobriety. Pt expresses hope for program placement and further drug cessation support.   Emotional Assessment Appearance:  Appears stated age Attitude/Demeanor/Rapport:    Affect (typically observed):  Adaptable, Accepting, Blunt Orientation:  Oriented to Self, Oriented to Place, Oriented to  Time, Oriented to Situation Alcohol / Substance use:  Illicit Drugs Psych involvement (Current and /or in the  community):  No (Comment)  Discharge Needs  Concerns to be addressed:  Care Coordination Readmission within the last 30 days:    Current discharge risk:  Lives alone, Substance Abuse, Chronically ill, Lack of support system Barriers to Discharge:  Active Substance Use   [place] 10/01/2017, 12:49 PM

## 2017-10-01 NOTE — Progress Notes (Signed)
Internal Medicine Attending  Date: 10/01/2017  Patient name: [name] Medical record number: [id number] Date of birth: 18-Apr-1960 Age: 57 y.o. Gender: male  I saw and evaluated the patient. I reviewed the resident's note by Dr. Sharon Seller and I agree with the resident's findings and plans as documented in her progress note.  When seen on rounds this morning [name] was without any chest pain. He was very motivated to get assistance to quit the cocaine. In fact, he's been accepted to [facility] in [place], [place] for inpatient rehabilitation. They anticipate an open bed tomorrow and he will be traveling to [facility] in the morning.

## 2017-10-01 NOTE — Progress Notes (Signed)
Discharge instructions reviewed with patient. Prescriptions already sent to pharmacy to be picked up by pt. All questions answered. Iv's removed. Pt with all belongings at time of d/c. Pt provided with 2 bus passes. In discharge paperwork, [facility] [place], called to have [facility] [place].

## 2017-10-01 NOTE — Social Work (Addendum)
CSW met with pt at bedside, pt states that he would like intensive inpatient treatment. Pt was given resources for local programs and CSW expressed that we are unable to hold pt's for inpatient treatment beds.  Pt mentions that he would like to go to [facility] [place], [place]. Which is an intensive inpatient treatment program in [place]. Pt states that he has been there before and would like to go again, pt states "they pay for you to get to [place], and to get home when you are done."   CSW will follow up with CSW leadership in regards to ability to make this referral, given that CSW is not familiar with this program.   12:24pm- CSW leadership (Zack Dunbar, [place]) approved referral, pt signed release of information form (placed on shadow hard chart). Will fax H&P, lab work, Presenter, broadcasting, med list, and progress note to [facility] at [phone]  12:47pm- Pt clinicals faxed, per CSW leadership we cannot hold pt for bed but expect a response today per [facility].   2:25pm- Follow up call placed to [facility], await return call regarding referral. Gave information to attending MD.   2:57pm- Pt clinicals are being reviewed by [facility] they express that they will have a bed for pt tomorrow, pt aware has bus passes home and to the bus station tomorrow. Pt number given to [facility], pt also has the number for [facility] and is committed to getting sober. Pt questions answered, pt bedside RN has bus passes for pt and will go over discharge instructions.   CSW signing off. Please consult if any additional needs arise.  Alexander Mt, [place] Work [phone]

## 2017-10-01 NOTE — Progress Notes (Signed)
   Subjective: [name] feeling well this am, with no SOB or CP. He states he is ready to go home today. We discussed support groups for his cocaine abuse and the importance of refraining from drug abuse because this could worsen his heart function.   Objective:  Vital signs in last 24 hours: Vitals:   10/01/17 0000 10/01/17 0200 10/01/17 0400 10/01/17 0731  BP: (!) 149/92 (!) 152/88 (!) 154/104 (!) 155/99  Pulse: 82 83 77 78  Resp: 16 20 13 16   Temp:   98.3 F (36.8 C) 98.1 F (36.7 C)  TempSrc:   Oral Oral  SpO2: 100% 100% 97% 99%  Weight:      Height:        Constitution:NAD, sitting at bedside, obese, appears stated age HENT:atraumatic, normocephalic Eyes:no scleral icterus, EOM intact Cardio:RRR, systolic murmur, negative for rubs, gallops, +1 pitting edema Respiratory:Clear to auscultation bilaterally Abdominal:+BS, NTTP, non-distended, soft DQQ:IWLNLGXQ 5/5, symmetric, sensations intact Neuro:A&Ox3, pleasant, cooperative Skin:clean, dry, and intact    Assessment/Plan:  Principal Problem:   Uncontrolled diabetes mellitus (HCC) Active Problems:   Hyperglycemia   Nonobstructive atherosclerosis of coronary artery   Chronic HFrEF (heart failure with reduced ejection fraction) ([place])   Pure hypercholesterolemia  57yo male with hx of TIIDM with neuropathy, HTN, systolic CHF (1194 EF 17%) drug abuse, suicide attempt, SI and MDD who presented with weakness and confusion for the past three days and new onset chest pain. Patient also fell while walking to the ED. He states he did not hit his head or have LOC.   #AMS secondary to dehydration: A&Ox3 on exam yesterday. UDS pos for cocaine, UA negative.   - am BMP  #AtypicalChest pain: Troponin 0.03x3. +UDS positive for cocaine use. Cardic cath 2015 with [facility] with insignificant findings. Described pain as sharp, stabbing pain that changes with movement. Lexican stress test w/EF 48%, otherwise no wall motion  abnormality or systolic dysfunction   - PCP follow-up   #Drug Abuse: Patient states he fears going home as he worries he will continue to abuse cocaine. We discussed the different resources in the area and the importance of having a support group. Resources and phone numbers were provided, as well as meeting times for NA. We also discussed the important of abstaining due to the risk to his heart.   #Grade 1 diastolic CHF: Echo today with moderate aortic stenosis, EF: 55-60%. Possible bicuspid aortic valve per cards. Last echo 2015, EF 40%.   - f/u echo every 1-2 years  - beta blocker not suggested due to cocaine abuse   #HTN: per patient not on any medications   - lisinopril  - amlodipine    #TIIDM:blood glucose 465 on admission. Normally on 1000mg  metformin bid &Tresiba 40U qhs. HbA1c 13.1  - Lantus 40U qd - novolog 4U tid qc & SSI - CBG monitoring  #AKI:resolved   #Hyponatremia:likely secondary to elevations in blood glucose, 134. Urine Na 12  #MDD: Hx of SI & suicide attempts.Pt takes wellbutrin for depression  - cont. wellbutrin - telesitter due to high risk on nursing assessment but no active thoughts of harming himself.   IVF: nonoe  VTE ppx: LVX   Dispo: Anticipated discharge in today.   Molli Hazard A, DO 10/01/2017, 10:43 AM Pager: [phone]

## 2017-12-31 ENCOUNTER — Inpatient Hospital Stay (HOSPITAL_COMMUNITY)
Admission: EM | Admit: 2017-12-31 | Discharge: 2018-01-02 | DRG: 292 | Disposition: A | Discharge status: Home / Self Care | Payer: Medicare Other | Attending: Internal Medicine | Admitting: Internal Medicine

## 2017-12-31 ENCOUNTER — Encounter (HOSPITAL_COMMUNITY): Payer: Self-pay | Admitting: Emergency Medicine

## 2017-12-31 ENCOUNTER — Other Ambulatory Visit: Payer: Self-pay

## 2017-12-31 ENCOUNTER — Emergency Department (HOSPITAL_COMMUNITY): Payer: Medicare Other

## 2017-12-31 DIAGNOSIS — E119 Type 2 diabetes mellitus without complications: Secondary | ICD-10-CM | POA: Diagnosis present

## 2017-12-31 DIAGNOSIS — Z9114 Patient's other noncompliance with medication regimen: Secondary | ICD-10-CM

## 2017-12-31 DIAGNOSIS — E1140 Type 2 diabetes mellitus with diabetic neuropathy, unspecified: Secondary | ICD-10-CM | POA: Diagnosis present

## 2017-12-31 DIAGNOSIS — I129 Hypertensive chronic kidney disease with stage 1 through stage 4 chronic kidney disease, or unspecified chronic kidney disease: Secondary | ICD-10-CM | POA: Diagnosis present

## 2017-12-31 DIAGNOSIS — Y92009 Unspecified place in unspecified non-institutional (private) residence as the place of occurrence of the external cause: Secondary | ICD-10-CM

## 2017-12-31 DIAGNOSIS — I5043 Acute on chronic combined systolic (congestive) and diastolic (congestive) heart failure: Secondary | ICD-10-CM | POA: Diagnosis present

## 2017-12-31 DIAGNOSIS — I10 Essential (primary) hypertension: Secondary | ICD-10-CM | POA: Diagnosis not present

## 2017-12-31 DIAGNOSIS — IMO0002 Reserved for concepts with insufficient information to code with codable children: Secondary | ICD-10-CM | POA: Diagnosis present

## 2017-12-31 DIAGNOSIS — Z9111 Patient's noncompliance with dietary regimen: Secondary | ICD-10-CM

## 2017-12-31 DIAGNOSIS — E1165 Type 2 diabetes mellitus with hyperglycemia: Secondary | ICD-10-CM | POA: Diagnosis not present

## 2017-12-31 DIAGNOSIS — Z8249 Family history of ischemic heart disease and other diseases of the circulatory system: Secondary | ICD-10-CM

## 2017-12-31 DIAGNOSIS — E871 Hypo-osmolality and hyponatremia: Secondary | ICD-10-CM | POA: Diagnosis present

## 2017-12-31 DIAGNOSIS — I5032 Chronic diastolic (congestive) heart failure: Secondary | ICD-10-CM | POA: Diagnosis present

## 2017-12-31 DIAGNOSIS — Z6841 Body Mass Index (BMI) 40.0 and over, adult: Secondary | ICD-10-CM

## 2017-12-31 DIAGNOSIS — D61818 Other pancytopenia: Secondary | ICD-10-CM | POA: Diagnosis present

## 2017-12-31 DIAGNOSIS — E1169 Type 2 diabetes mellitus with other specified complication: Secondary | ICD-10-CM | POA: Diagnosis present

## 2017-12-31 DIAGNOSIS — I5033 Acute on chronic diastolic (congestive) heart failure: Secondary | ICD-10-CM | POA: Diagnosis not present

## 2017-12-31 DIAGNOSIS — D469 Myelodysplastic syndrome, unspecified: Secondary | ICD-10-CM | POA: Diagnosis present

## 2017-12-31 DIAGNOSIS — E7800 Pure hypercholesterolemia, unspecified: Secondary | ICD-10-CM | POA: Diagnosis present

## 2017-12-31 DIAGNOSIS — E669 Obesity, unspecified: Secondary | ICD-10-CM | POA: Diagnosis present

## 2017-12-31 DIAGNOSIS — Z91128 Patient's intentional underdosing of medication regimen for other reason: Secondary | ICD-10-CM

## 2017-12-31 DIAGNOSIS — I110 Hypertensive heart disease with heart failure: Secondary | ICD-10-CM | POA: Diagnosis not present

## 2017-12-31 DIAGNOSIS — I509 Heart failure, unspecified: Secondary | ICD-10-CM

## 2017-12-31 DIAGNOSIS — Z7982 Long term (current) use of aspirin: Secondary | ICD-10-CM

## 2017-12-31 DIAGNOSIS — E785 Hyperlipidemia, unspecified: Secondary | ICD-10-CM | POA: Diagnosis present

## 2017-12-31 DIAGNOSIS — Z794 Long term (current) use of insulin: Secondary | ICD-10-CM

## 2017-12-31 DIAGNOSIS — Z79899 Other long term (current) drug therapy: Secondary | ICD-10-CM

## 2017-12-31 DIAGNOSIS — E1149 Type 2 diabetes mellitus with other diabetic neurological complication: Secondary | ICD-10-CM

## 2017-12-31 DIAGNOSIS — Z808 Family history of malignant neoplasm of other organs or systems: Secondary | ICD-10-CM

## 2017-12-31 DIAGNOSIS — F39 Unspecified mood [affective] disorder: Secondary | ICD-10-CM | POA: Diagnosis present

## 2017-12-31 DIAGNOSIS — R739 Hyperglycemia, unspecified: Secondary | ICD-10-CM | POA: Diagnosis present

## 2017-12-31 DIAGNOSIS — F319 Bipolar disorder, unspecified: Secondary | ICD-10-CM | POA: Diagnosis present

## 2017-12-31 DIAGNOSIS — T501X6A Underdosing of loop [high-ceiling] diuretics, initial encounter: Secondary | ICD-10-CM | POA: Diagnosis present

## 2017-12-31 DIAGNOSIS — D72819 Decreased white blood cell count, unspecified: Secondary | ICD-10-CM | POA: Diagnosis present

## 2017-12-31 DIAGNOSIS — F1410 Cocaine abuse, uncomplicated: Secondary | ICD-10-CM | POA: Diagnosis present

## 2017-12-31 HISTORY — DX: Heart failure, unspecified: I50.9

## 2017-12-31 HISTORY — DX: Unspecified osteoarthritis, unspecified site: M19.90

## 2017-12-31 LAB — COMPREHENSIVE METABOLIC PANEL
ALT: 15 U/L (ref 0–44)
AST: 23 U/L (ref 15–41)
Albumin: 3 g/dL — ABNORMAL LOW (ref 3.5–5.0)
Alkaline Phosphatase: 75 U/L (ref 38–126)
Anion gap: 10 (ref 5–15)
BUN: 10 mg/dL (ref 6–20)
CO2: 23 mmol/L (ref 22–32)
Calcium: 8.8 mg/dL — ABNORMAL LOW (ref 8.9–10.3)
Chloride: 100 mmol/L (ref 98–111)
Creatinine, Ser: 0.88 mg/dL (ref 0.61–1.24)
GFR calc Af Amer: >60 mL/min (ref >60)
GFR calc non Af Amer: >60 mL/min (ref >60)
Glucose, Bld: 401 mg/dL — ABNORMAL HIGH (ref 70–99)
Potassium: 3.9 mmol/L (ref 3.5–5.1)
Sodium: 133 mmol/L — ABNORMAL LOW (ref 135–145)
Total Bilirubin: 0.7 mg/dL (ref 0.3–1.2)
Total Protein: 6.6 g/dL (ref 6.5–8.1)

## 2017-12-31 LAB — CBC WITH DIFFERENTIAL/PLATELET
Abs Immature Granulocytes: 0.03 10*3/uL (ref 0.00–0.07)
Basophils Absolute: 0 10*3/uL (ref 0.0–0.1)
Basophils Relative: 0 %
Eosinophils Absolute: 0.1 10*3/uL (ref 0.0–0.5)
Eosinophils Relative: 2 %
HCT: 35.5 % — ABNORMAL LOW (ref 39.0–52.0)
Hemoglobin: 11.4 g/dL — ABNORMAL LOW (ref 13.0–17.0)
Immature Granulocytes: 1 %
Lymphocytes Relative: 37 %
Lymphs Abs: 1.1 10*3/uL (ref 0.7–4.0)
MCH: 26.1 pg (ref 26.0–34.0)
MCHC: 32.1 g/dL (ref 30.0–36.0)
MCV: 81.4 fL (ref 80.0–100.0)
Monocytes Absolute: 0.4 10*3/uL (ref 0.1–1.0)
Monocytes Relative: 14 %
Neutro Abs: 1.4 10*3/uL — ABNORMAL LOW (ref 1.7–7.7)
Neutrophils Relative %: 46 %
Platelets: 188 10*3/uL (ref 150–400)
RBC: 4.36 MIL/uL (ref 4.22–5.81)
RDW: 13.3 % (ref 11.5–15.5)
WBC: 3 10*3/uL — ABNORMAL LOW (ref 4.0–10.5)
nRBC: 0 % (ref 0.0–0.2)

## 2017-12-31 LAB — TROPONIN I: Troponin I: <0.03 ng/mL (ref <0.03)

## 2017-12-31 LAB — GLUCOSE, CAPILLARY
Glucose-Capillary: 288 mg/dL — ABNORMAL HIGH (ref 70–99)
Glucose-Capillary: 367 mg/dL — ABNORMAL HIGH (ref 70–99)

## 2017-12-31 LAB — BRAIN NATRIURETIC PEPTIDE: B Natriuretic Peptide: 97.6 pg/mL (ref 0.0–100.0)

## 2017-12-31 LAB — CBG MONITORING, ED: Glucose-Capillary: 290 mg/dL — ABNORMAL HIGH (ref 70–99)

## 2017-12-31 MED ORDER — BUPROPION HCL ER (XL) 300 MG PO TB24: 300.0000 mg | ORAL_TABLET | Freq: Every day | ORAL | Status: DC

## 2017-12-31 MED ORDER — INSULIN ASPART 100 UNIT/ML ~~LOC~~ SOLN: 0.0000 [IU] | Freq: Every day | SUBCUTANEOUS | Status: DC

## 2017-12-31 MED ORDER — INSULIN ASPART 100 UNIT/ML ~~LOC~~ SOLN
0.0000 [IU] | Freq: Three times a day (TID) | SUBCUTANEOUS | Status: DC
  Administered 2018-01-01: 5 [IU] via SUBCUTANEOUS
  Administered 2018-01-01: 3 [IU] via SUBCUTANEOUS
  Administered 2018-01-02: 8 [IU] via SUBCUTANEOUS

## 2017-12-31 MED ORDER — METFORMIN HCL 500 MG PO TABS: 1000.0000 mg | ORAL_TABLET | Freq: Two times a day (BID) | ORAL | Status: DC

## 2017-12-31 MED ORDER — GABAPENTIN 300 MG PO CAPS: 600.0000 mg | ORAL_CAPSULE | Freq: Three times a day (TID) | ORAL | Status: DC

## 2017-12-31 MED ORDER — ENOXAPARIN SODIUM 60 MG/0.6ML ~~LOC~~ SOLN
60.0000 mg | SUBCUTANEOUS | Status: DC
  Administered 2017-12-31 – 2018-01-01 (×2): 60 mg via SUBCUTANEOUS
  Filled 2017-12-31 (×2): qty 0.6

## 2017-12-31 MED ORDER — MORPHINE SULFATE (PF) 4 MG/ML IV SOLN
4.0000 mg | Freq: Once | INTRAVENOUS | Status: AC
  Administered 2017-12-31: 4 mg via INTRAVENOUS
  Filled 2017-12-31: qty 1

## 2017-12-31 MED ORDER — BENAZEPRIL HCL 10 MG PO TABS
10.0000 mg | ORAL_TABLET | Freq: Every day | ORAL | Status: DC
  Administered 2017-12-31 – 2018-01-02 (×3): 10 mg via ORAL
  Filled 2017-12-31 (×3): qty 1

## 2017-12-31 MED ORDER — ATORVASTATIN CALCIUM 20 MG PO TABS: 20.0000 mg | ORAL_TABLET | Freq: Every day | ORAL | Status: DC

## 2017-12-31 MED ORDER — AMLODIPINE BESYLATE 5 MG PO TABS
5.0000 mg | ORAL_TABLET | Freq: Every day | ORAL | Status: DC
  Administered 2017-12-31 – 2018-01-02 (×3): 5 mg via ORAL
  Filled 2017-12-31 (×4): qty 1

## 2017-12-31 MED ORDER — INSULIN DEGLUDEC 100 UNIT/ML ~~LOC~~ SOPN: 40.0000 [IU] | PEN_INJECTOR | Freq: Every evening | SUBCUTANEOUS | Status: DC

## 2017-12-31 MED ORDER — FUROSEMIDE 10 MG/ML IJ SOLN
60.0000 mg | Freq: Three times a day (TID) | INTRAMUSCULAR | Status: DC
  Administered 2017-12-31 – 2018-01-02 (×5): 60 mg via INTRAVENOUS
  Filled 2017-12-31 (×5): qty 6

## 2017-12-31 MED ORDER — FUROSEMIDE 10 MG/ML IJ SOLN
60.0000 mg | Freq: Once | INTRAMUSCULAR | Status: AC
  Administered 2017-12-31: 60 mg via INTRAVENOUS
  Filled 2017-12-31: qty 6

## 2017-12-31 MED ORDER — INSULIN GLARGINE 100 UNIT/ML ~~LOC~~ SOLN
40.0000 [IU] | Freq: Every day | SUBCUTANEOUS | Status: DC
  Administered 2017-12-31 – 2018-01-01 (×2): 40 [IU] via SUBCUTANEOUS
  Filled 2017-12-31 (×3): qty 0.4

## 2017-12-31 MED ORDER — ASPIRIN 81 MG PO TBEC: 81.0000 mg | DELAYED_RELEASE_TABLET | Freq: Every day | ORAL | Status: DC

## 2017-12-31 MED ORDER — HYDRALAZINE HCL 20 MG/ML IJ SOLN
5.0000 mg | Freq: Four times a day (QID) | INTRAMUSCULAR | Status: DC | PRN
  Administered 2017-12-31: 5 mg via INTRAVENOUS
  Filled 2017-12-31: qty 1

## 2017-12-31 NOTE — ED Notes (Signed)
MD made aware of pressure.

## 2017-12-31 NOTE — ED Notes (Signed)
Pt aware we need a urine specimen. 

## 2017-12-31 NOTE — ED Triage Notes (Addendum)
Pt states swelling and sob for swelling months.  Pt was seen at a hospital in [place] and did not stay for treatment due to a family wedding.  Pt in no acute distress in triage.

## 2017-12-31 NOTE — ED Notes (Signed)
Paged tirad

## 2017-12-31 NOTE — Progress Notes (Signed)
During shift change patient he was informed he was a high risk for falls since he has a history of falls. Patient informed he will need a low bed. Patient informed nurse he does not want a low bed because he will have difficulty getting out of bed. Fall risk band placed on patient's arm and yellow socks to both feet. Patient informed to call before getting out of bed. Personal items within reach of patient. Bed alarm activated.

## 2017-12-31 NOTE — H&P (Signed)
TRH H&P   Patient Demographics:    [name], is a 57 y.o. male  MRN: [id number]   DOB - August 31, 1960  Admit Date - 12/31/2017  Outpatient Primary MD for the patient is Care, Jinny Blossom [facility]  Referring MD/NP/PA: Dr Laverta Baltimore  Patient coming from: Home  Chief Complaint  Patient presents with  . Weakness  . Shortness of Breath      HPI:    [name]  is a 57 y.o. male, with past medical history of hypertension, diabetes mellitus, insulin-dependent, with neuropathy, chronic systolic/diastolic CHF, drug abuse, depression, who presents to ED with complaints of lower extremity edema, she is hospitalized at [facility] last August for nontypical chest pain, with 2D echo showing EF 01%, and diastolic dysfunction, with low risk stress test done then, he presents with lower extremity edema, reports he has been developing over last few weeks, reports he stopped taking his Lasix as he did not see his PCP for couple months now, as were reports he is noncompliant with salt restricted diet, he does report some dyspnea, only upon exertion, as well upon laying supine, he has been sleeping on recliner, he does report some chest pain, no provoking or relieving factor, has any accompanying nausea, diaphoresis or vomiting. -in ED had normal proBNP at 97, negative troponin, nonacute EKG, uncontrolled glucose of 401, hyponatremia of 133, on baseline leukopenia at 3, x-ray with some evidence of volume overload, received IV Lasix and I was called to admit    Review of systems:    In addition to the HPI above,  No Fever-chills, No Headache, No changes with Vision or hearing, No problems swallowing food or Liquids, Reports some intermittent chest pain, denies cough, reports exertional miss of breath and orthopnea No Abdominal pain, No Nausea or Vommitting, Bowel movements are regular, No  Blood in stool or Urine, No dysuria, No new skin rashes or bruises, No new joints pains-aches,  No new weakness, tingling, numbness in any extremity, No recent weight gain or loss, No polyuria, polydypsia or polyphagia, No significant Mental Stressors.  A full 10 point Review of Systems was done, except as stated above, all other Review of Systems were negative.   With Past History of the following :    Past Medical History:  Diagnosis Date  . Bipolar affective ([place])   . Depression   . Diabetes mellitus    uncontrolled   . Hypertension       Past Surgical History:  Procedure Laterality Date  . KNEE SURGERY     b/l knees; pending total knee       Social History:     Social History   Tobacco Use  . Smoking status: Former Research scientist (life sciences)  . Smokeless tobacco: Never Used  Substance Use Topics  . Alcohol use: No     Lives -at home, reports he lives at [place],  but he travels frequently between [place] as his father lives here  Mobility -with cane     Family History :     Family History  Problem Relation Age of Onset  . Diabetes Unknown   . Cancer Mother        uterus; deceased   . Heart attack Mother 15  . Hypertension Unknown   . Heart attack Father 71  . Stroke Sister        12/2012 age 102 y.o      Home Medications:   Prior to Admission medications   Medication Sig Start Date End Date Taking? Authorizing Provider  amLODipine (NORVASC) 5 MG tablet Take 1 tablet (5 mg total) by mouth daily. 10/01/17   Seawell, Jaimie A, DO  aspirin EC 81 MG EC tablet Take 1 tablet (81 mg total) by mouth daily. 10/02/17   Seawell, Jaimie A, DO  atorvastatin (LIPITOR) 40 MG tablet Take 0.5 tablets (20 mg total) by mouth daily at 6 PM. 10/01/17   Seawell, Jaimie A, DO  benazepril (LOTENSIN) 10 MG tablet Take 1 tablet (10 mg total) by mouth daily. 10/02/17   Seawell, Jaimie A, DO  buPROPion (WELLBUTRIN XL) 300 MG 24 hr tablet Take 1 tablet (300 mg total) by mouth  daily at 6 PM. Patient not taking: Reported on 09/28/2017 08/03/17   Patrecia Pour, NP  gabapentin (NEURONTIN) 300 MG capsule Take 2 capsules (600 mg total) by mouth 3 (three) times daily. Patient not taking: Reported on 09/28/2017 08/03/17   Patrecia Pour, NP  insulin degludec (TRESIBA FLEXTOUCH) 100 UNIT/ML SOPN FlexTouch Pen Inject 40 Units into the skin every evening.  05/27/17   [provider]  metFORMIN (GLUCOPHAGE) 1000 MG tablet Take 1 tablet (1,000 mg total) by mouth 2 (two) times daily with a meal. For diabetes control Patient not taking: Reported on 07/29/2017 01/09/15   [name], MD     Allergies:     Allergies  Allergen Reactions  . Other Anaphylaxis    Allergy to GRITS , unknown exactly the specific ingredient he is allergic too     Physical Exam:   Vitals  Blood pressure (!) 171/119, pulse 93, resp. rate 16, height 5\' 8"  (1.727 m), weight 123.4 kg, SpO2 100 %.   1. General piecemealed laying in bed in no apparent distress  2. Normal affect and insight, Not Suicidal or Homicidal, Awake Alert, Oriented X 3.  3. No F.N deficits, ALL C.Nerves Intact, Strength 5/5 all 4 extremities, Sensation intact all 4 extremities, Plantars down going.  4. Ears and Eyes appear Normal, Conjunctivae clear, PERRLA. Moist Oral Mucosa.  +JVD  5. Supple Neck, No JVD, No cervical lymphadenopathy appriciated, No Carotid Bruits.  6. Symmetrical Chest wall movement, Good air movement bilaterally, CTAB.  7. RRR, No Gallops, Rubs or Murmurs, No Parasternal Heave.  8. Positive Bowel Sounds, Abdomen Soft, No tenderness, No organomegaly appriciated,No rebound -guarding or rigidity.  9.  No Cyanosis, Normal Skin Turgor, No Skin Rash or Bruise.  10. Good muscle tone,  joints appear normal , no effusions, Normal ROM.  +3 lower extremity edema  11. No Palpable Lymph Nodes in Neck or Axillae    Data Review:    CBC Recent Labs  Lab 12/31/17 1133  WBC 3.0*  HGB 11.4*  HCT  35.5*  PLT 188  MCV 81.4  MCH 26.1  MCHC 32.1  RDW 13.3  LYMPHSABS 1.1  MONOABS 0.4  EOSABS 0.1  BASOSABS 0.0   ------------------------------------------------------------------------------------------------------------------  Chemistries  Recent Labs  Lab 12/31/17 1133  NA 133*  K 3.9  CL 100  CO2 23  GLUCOSE 401*  BUN 10  CREATININE 0.88  CALCIUM 8.8*  AST 23  ALT 15  ALKPHOS 75  BILITOT 0.7   ------------------------------------------------------------------------------------------------------------------ estimated creatinine clearance is 119.8 mL/min (by C-G formula based on SCr of 0.88 mg/dL). ------------------------------------------------------------------------------------------------------------------ No results for input(s): TSH, T4TOTAL, T3FREE, THYROIDAB in the last 72 hours.  Invalid input(s): FREET3  Coagulation profile No results for input(s): INR, PROTIME in the last 168 hours. ------------------------------------------------------------------------------------------------------------------- No results for input(s): DDIMER in the last 72 hours. -------------------------------------------------------------------------------------------------------------------  Cardiac Enzymes Recent Labs  Lab 12/31/17 1133  TROPONINI <0.03   ------------------------------------------------------------------------------------------------------------------    Component Value Date/Time   BNP 97.6 12/31/2017 1133     ---------------------------------------------------------------------------------------------------------------  Urinalysis    Component Value Date/Time   COLORURINE STRAW (A) 12/17/2016 1855   APPEARANCEUR CLEAR 12/17/2016 1855   APPEARANCEUR Clear 12/31/2013 2116   LABSPEC 1.029 12/17/2016 1855   LABSPEC 1.032 12/31/2013 2116   PHURINE 7.0 12/17/2016 1855   GLUCOSEU >=500 (A) 12/17/2016 1855   GLUCOSEU >=500 12/31/2013 2116   HGBUR  NEGATIVE 12/17/2016 1855   BILIRUBINUR NEGATIVE 12/17/2016 1855   BILIRUBINUR Negative 12/31/2013 2116   KETONESUR NEGATIVE 12/17/2016 1855   PROTEINUR 100 (A) 12/17/2016 1855   UROBILINOGEN 1.0 08/28/2013 1709   NITRITE NEGATIVE 12/17/2016 1855   LEUKOCYTESUR NEGATIVE 12/17/2016 1855   LEUKOCYTESUR Negative 12/31/2013 2116    ----------------------------------------------------------------------------------------------------------------   Imaging Results:    Dg Chest 2 View  Result Date: 12/31/2017 CLINICAL DATA:  Chronic shortness of breath. EXAM: CHEST - 2 VIEW COMPARISON:  09/29/2017. FINDINGS: Cardiomegaly with mild bilateral interstitial prominence. Findings suggest mild CHF. Mild basilar atelectasis. No acute bony abnormality. IMPRESSION: Cardiomegaly with mild bilateral interstitial prominence consistent with mild CHF. Electronically Signed   By: Marcello Moores  Register   On: 12/31/2017 12:53    My personal review of EKG: Rhythm NSR, Rate  91 /min, QTc 451 , no Acute ST changes   Assessment & Plan:    Active Problems:   Episodic mood disorder (HCC)   HTN (hypertension), benign   Uncontrolled diabetes mellitus (HCC)   Hyperglycemia   Pancytopenia (HCC)   Diabetic neuropathy (HCC)   Pure hypercholesterolemia   Acute on chronic diastolic CHF (congestive heart failure) (HCC)   Acute on chronic diastolic CHF -Most recent echo August 2019 with a preserved EF 08%, and diastolic dysfunction. -Presents with volume overload, evident on imaging, and +3 edema, even though proBNP within normal limit, no hypoxia, recently no baseline weighted during most recent admission, it was 123 mg on admission, most likely due to noncompliant with salt restriction and medication, he will be admitted to telemetry, continue with daily weight, strict ins and outs, fluid restriction, IV Lasix 60 mg every 8 hours, and her electrolytes closely and replete as needed no need to repeat echo as it was recently  done.  Nontypical chest pain -EKG nonacute, negative troponin, low risk stress test August of this year   Controlled diabetes mellitus -Continue with home dose Tresiba, will start on insulin sliding scale during hospital stay, continue with metformin, continue with carb modified diet, follow on A1c  Hypertension -Pressure controlled, continue with home meds, will add PRN hydralazine  Hyperlipidemia -Continue with home dose statin  Hyponatremia -Pseudo-hyponatremia hyperglycemia, within normal limits once corrected to glucose level  History of cocaine abuse -Denies any drug abuse since  August, will check urine drug screen  History of depression -Continue with home meds  Diabetic neuropathy -Continue with gabapentin   DVT Prophylaxis   Lovenox - SCDs  AM Labs Ordered, also please review Full Orders  Family Communication: Admission, patients condition and plan of care including tests being ordered have been discussed with the patient who indicate understanding and agree with the plan and Code Status.  Code Status full code  Likely DC to home  Condition GUARDED    Consults called: none  Admission status: observation  Time spent in minutes : 60 minutes   Phillips Climes M.D on 12/31/2017 at 4:57 PM  Between 7am to 7pm - Pager - [phone]. After 7pm go to [email or web address] - password [facility]  [facility] - Office  [phone]

## 2017-12-31 NOTE — Progress Notes (Signed)
Went to give patient's evening medications, but Pharmacy had discontinued some of them.   Called pharmacy- they stated since patient has not been taking them at home, doctor will have to order and might have to do a dosage adjustment.  Pharmacy also does not have the tresiba in stock- so will start the Lantus.

## 2017-12-31 NOTE — Progress Notes (Signed)
Patient is not being compliant with fluid restrictions.   Provided education regarding importance of monitoring fluid in take.   Patient arguing back-  "states he needs more water/fluids".   Explain that too much fluid is what is causing his lower leg edema.  Pt stated he will refill his cup from sink.   Patient also stated if he needs to go to bathroom, he will not wait long before he will get up himself.   Provided education regarding risk for falls, however patient states he will not fall, despite his admitting he has fell multiple times.   Patient stated that if we put the mats down he will fall.

## 2017-12-31 NOTE — Progress Notes (Signed)
Bed alarm turned off per patient request. I reinstated to patient considering his history of falls he will need the bed alarm for safety. Patient informed stated to nurse "I know  how to get up an walk" Falls education done with patient. Patient verbalizes understanding.

## 2017-12-31 NOTE — ED Provider Notes (Signed)
Emergency Department Provider Note   I have reviewed the triage vital signs and the nursing notes.   HISTORY  Chief Complaint Weakness and Shortness of Breath   HPI [name] is a 57 y.o. male with PMH of Bipolar disorder, DM, HTN, and dCHF presents to the emergency department for evaluation of significantly worsening lower extremity edema, generalized weakness, and shortness of breath with exertion.  He reports intermittent chest pain but nothing reproducible or consistent.  He states he has to sleep sitting up in recliner both because of breathing difficulty and weakness and having trouble eating up after laying flat.  He states his been on Lasix "on and off" for a "while." Unsure of the current dose. Denies any fever/chills.  Patient states he was evaluated at a [facility] area hospital while at his brother's wedding but refused admission at that time because of wanting to attend the wedding.   Patient also complaining of burning pain in the upper and lower extremities.  He does have known neuropathy in the hands and feet but states he is now having pain in the forearms and calfs.   Past Medical History:  Diagnosis Date  . Bipolar affective ([place])   . Depression   . Diabetes mellitus    uncontrolled   . Hypertension     Patient Active Problem List   Diagnosis Date Noted  . Acute on chronic diastolic CHF (congestive heart failure) ([place]) 12/31/2017  . Poorly controlled diabetes mellitus ([place])   . Pure hypercholesterolemia   . Hyperglycemia 09/28/2017  . Nonobstructive atherosclerosis of coronary artery 09/28/2017  . Chronic HFrEF (heart failure with reduced ejection fraction) ([place]) 09/28/2017  . MDD (major depressive disorder), recurrent episode, severe ([place]) 07/30/2017  . Osteoarthritis 04/12/2015  . Severe recurrent major depression with psychotic features ([place]) 01/04/2015  . Substance-related disorder ([place]) 10/30/2014  . Patient's noncompliance with other medical  treatment and regimen 06/07/2014  . Cocaine dependence, uncomplicated ([place]) 15/72/6203  . Chronic pain 05/31/2014  . Cocaine abuse ([place]) 05/31/2014  . History of noncompliance with medical treatment 05/31/2014  . Malingering 05/31/2014  . Pancytopenia ([place]) 05/31/2014  . Diabetic neuropathy associated with diabetes mellitus due to underlying condition ([place]) 05/31/2014  . Diabetic neuropathy ([place]) 05/31/2014  . Congestive heart failure with left ventricular systolic dysfunction ([place]) 02/22/2014  . Depression, major, recurrent, severe with psychosis ([place]) 08/29/2013  . Suicidal ideations 08/28/2013  . Uncontrolled diabetes mellitus ([place]) 06/30/2013  . Atypical chest pain 12/09/2011  . HTN (hypertension), benign 12/09/2011  . Episodic mood disorder ([place]) 05/13/2011    Past Surgical History:  Procedure Laterality Date  . KNEE SURGERY     b/l knees; pending total knee     Allergies Other  Family History  Problem Relation Age of Onset  . Diabetes Unknown   . Cancer Mother        uterus; deceased   . Heart attack Mother 41  . Hypertension Unknown   . Heart attack Father 14  . Stroke Sister        12/2012 age 67 y.o    Social History Social History   Tobacco Use  . Smoking status: Former Research scientist (life sciences)  . Smokeless tobacco: Never Used  Substance Use Topics  . Alcohol use: No  . Drug use: Yes    Types: Cocaine    Review of Systems  Constitutional: No fever/chills Eyes: No visual changes. ENT: No sore throat. Cardiovascular: Positive intermittent chest pain. Positive bilateral LE edema and pain.  Respiratory: Positive shortness of breath. Gastrointestinal: No abdominal pain.  No nausea, no vomiting.  No diarrhea.  No constipation. Genitourinary: Negative for dysuria. Musculoskeletal: Negative for back pain. Skin: Negative for rash. Neurological: Negative for headaches, focal weakness or numbness.   10-point ROS otherwise  negative.  ____________________________________________   PHYSICAL EXAM:  VITAL SIGNS: ED Triage Vitals  Enc Vitals Group     BP 12/31/17 1116 (!) 184/113     Pulse Rate 12/31/17 1118 91     Resp 12/31/17 1118 16     SpO2 12/31/17 1118 100 %     Weight 12/31/17 1124 272 lb (123.4 kg)     Height 12/31/17 1124 5\' 8"  (1.727 m)     Pain Score 12/31/17 1124 10   Constitutional: Alert and oriented. Well appearing and in no acute distress. Eyes: Conjunctivae are normal.  Head: Atraumatic. Nose: No congestion/rhinnorhea. Mouth/Throat: Mucous membranes are moist.  Oropharynx non-erythematous. Neck: No stridor.  Cardiovascular: Normal rate, regular rhythm. Good peripheral circulation. Grossly normal heart sounds.   Respiratory: Normal respiratory effort.  No retractions. Lungs CTAB. Gastrointestinal: Soft and nontender. No distention.  Musculoskeletal: No lower extremity tenderness with 3+ pitting edema in the B/L LEs. No gross deformities of extremities. Neurologic:  Normal speech and language. No gross focal neurologic deficits are appreciated.  Skin:  Skin is warm, dry and intact. No rash noted.  ____________________________________________   LABS (all labs ordered are listed, but only abnormal results are displayed)  Labs Reviewed  COMPREHENSIVE METABOLIC PANEL - Abnormal; Notable for the following components:      Result Value   Sodium 133 (*)    Glucose, Bld 401 (*)    Calcium 8.8 (*)    Albumin 3.0 (*)    All other components within normal limits  CBC WITH DIFFERENTIAL/PLATELET - Abnormal; Notable for the following components:   WBC 3.0 (*)    Hemoglobin 11.4 (*)    HCT 35.5 (*)    Neutro Abs 1.4 (*)    All other components within normal limits  GLUCOSE, CAPILLARY - Abnormal; Notable for the following components:   Glucose-Capillary 288 (*)    All other components within normal limits  CBG MONITORING, ED - Abnormal; Notable for the following components:    Glucose-Capillary 290 (*)    All other components within normal limits  BRAIN NATRIURETIC PEPTIDE  TROPONIN I  URINALYSIS, ROUTINE W REFLEX MICROSCOPIC  RAPID URINE DRUG SCREEN, HOSP PERFORMED  HEMOGLOBIN Q8G  BASIC METABOLIC PANEL  CBC  CREATININE, SERUM   ____________________________________________  EKG   EKG Interpretation  Date/Time:  Wednesday December 31 2017 11:17:57 EST Ventricular Rate:  91 PR Interval:    QRS Duration: 79 QT Interval:  366 QTC Calculation: 451 R Axis:   56 Text Interpretation:  Sinus rhythm Anterior infarct, old No STEMI.  Confirmed by Nanda Quinton [phone]) on 12/31/2017 11:23:27 AM       ____________________________________________  RADIOLOGY  Dg Chest 2 View  Result Date: 12/31/2017 CLINICAL DATA:  Chronic shortness of breath. EXAM: CHEST - 2 VIEW COMPARISON:  09/29/2017. FINDINGS: Cardiomegaly with mild bilateral interstitial prominence. Findings suggest mild CHF. Mild basilar atelectasis. No acute bony abnormality. IMPRESSION: Cardiomegaly with mild bilateral interstitial prominence consistent with mild CHF. Electronically Signed   By: Marcello Moores  Register   On: 12/31/2017 12:53    ____________________________________________   PROCEDURES  Procedure(s) performed:   Procedures  None ____________________________________________   INITIAL IMPRESSION / ASSESSMENT AND PLAN / ED COURSE  Pertinent  labs & imaging results that were available during my care of the patient were reviewed by me and considered in my medical decision making (see chart for details).  Patient with known diastolic heart failure presents with worsening lower extremity edema, exertional dyspnea, intermittent chest discomfort.  No symptoms at rest.  Patient appears moderately volume overloaded.  No hypoxemia at rest.  Unclear of current Lasix dose.  Plan for screening labs and chest x-ray.  EKG near baseline.   Patient with mild edema. BNP normal along with troponin.  Patient clinically volume up. Given IV lasix and will admit for enzyme trending and diuresis.   Discussed patient's case with Hospitalist to request admission. Patient and family (if present) updated with plan. Care transferred to Hospitalist service.  I reviewed all nursing notes, vitals, pertinent old records, EKGs, labs, imaging (as available).  ____________________________________________  FINAL CLINICAL IMPRESSION(S) / ED DIAGNOSES  Final diagnoses:  Acute on chronic congestive heart failure, unspecified heart failure type ([place])    MEDICATIONS GIVEN DURING THIS VISIT:  Medications  amLODipine (NORVASC) tablet 5 mg (5 mg Oral Given 12/31/17 1701)  aspirin EC tablet 81 mg (has no administration in time range)  atorvastatin (LIPITOR) tablet 20 mg (has no administration in time range)  benazepril (LOTENSIN) tablet 10 mg (has no administration in time range)  buPROPion (WELLBUTRIN XL) 24 hr tablet 300 mg (has no administration in time range)  gabapentin (NEURONTIN) capsule 600 mg (has no administration in time range)  insulin degludec (TRESIBA) 100 UNIT/ML FlexTouch Pen 40 Units (has no administration in time range)  metFORMIN (GLUCOPHAGE) tablet 1,000 mg (has no administration in time range)  furosemide (LASIX) injection 60 mg (has no administration in time range)  hydrALAZINE (APRESOLINE) injection 5 mg (5 mg Intravenous Given 12/31/17 1713)  enoxaparin (LOVENOX) injection 60 mg (has no administration in time range)  insulin aspart (novoLOG) injection 0-15 Units (has no administration in time range)  insulin aspart (novoLOG) injection 0-5 Units (has no administration in time range)  furosemide (LASIX) injection 60 mg (60 mg Intravenous Given 12/31/17 1416)  morphine 4 MG/ML injection 4 mg (4 mg Intravenous Given 12/31/17 1416)    Note:  This document was prepared using Dragon voice recognition software and may include unintentional dictation errors.  Nanda Quinton, MD Emergency  Medicine    Chaniyah Jahr, [facility], MD 12/31/17 [phone]

## 2018-01-01 ENCOUNTER — Encounter (HOSPITAL_COMMUNITY): Payer: Self-pay | Admitting: General Practice

## 2018-01-01 DIAGNOSIS — Z9119 Patient's noncompliance with other medical treatment and regimen: Secondary | ICD-10-CM | POA: Diagnosis not present

## 2018-01-01 DIAGNOSIS — I10 Essential (primary) hypertension: Secondary | ICD-10-CM | POA: Diagnosis not present

## 2018-01-01 DIAGNOSIS — Z79899 Other long term (current) drug therapy: Secondary | ICD-10-CM | POA: Diagnosis not present

## 2018-01-01 DIAGNOSIS — I509 Heart failure, unspecified: Secondary | ICD-10-CM

## 2018-01-01 DIAGNOSIS — Z7982 Long term (current) use of aspirin: Secondary | ICD-10-CM | POA: Diagnosis not present

## 2018-01-01 DIAGNOSIS — E1165 Type 2 diabetes mellitus with hyperglycemia: Secondary | ICD-10-CM | POA: Diagnosis present

## 2018-01-01 DIAGNOSIS — E1140 Type 2 diabetes mellitus with diabetic neuropathy, unspecified: Secondary | ICD-10-CM | POA: Diagnosis present

## 2018-01-01 DIAGNOSIS — I110 Hypertensive heart disease with heart failure: Secondary | ICD-10-CM | POA: Diagnosis present

## 2018-01-01 DIAGNOSIS — Z91128 Patient's intentional underdosing of medication regimen for other reason: Secondary | ICD-10-CM | POA: Diagnosis not present

## 2018-01-01 DIAGNOSIS — Z8249 Family history of ischemic heart disease and other diseases of the circulatory system: Secondary | ICD-10-CM | POA: Diagnosis not present

## 2018-01-01 DIAGNOSIS — E669 Obesity, unspecified: Secondary | ICD-10-CM | POA: Diagnosis present

## 2018-01-01 DIAGNOSIS — I5033 Acute on chronic diastolic (congestive) heart failure: Secondary | ICD-10-CM | POA: Diagnosis not present

## 2018-01-01 DIAGNOSIS — D61818 Other pancytopenia: Secondary | ICD-10-CM | POA: Diagnosis present

## 2018-01-01 DIAGNOSIS — Z9111 Patient's noncompliance with dietary regimen: Secondary | ICD-10-CM | POA: Diagnosis not present

## 2018-01-01 DIAGNOSIS — E871 Hypo-osmolality and hyponatremia: Secondary | ICD-10-CM | POA: Diagnosis present

## 2018-01-01 DIAGNOSIS — E785 Hyperlipidemia, unspecified: Secondary | ICD-10-CM | POA: Diagnosis present

## 2018-01-01 DIAGNOSIS — Y92009 Unspecified place in unspecified non-institutional (private) residence as the place of occurrence of the external cause: Secondary | ICD-10-CM | POA: Diagnosis not present

## 2018-01-01 DIAGNOSIS — T501X6A Underdosing of loop [high-ceiling] diuretics, initial encounter: Secondary | ICD-10-CM | POA: Diagnosis present

## 2018-01-01 DIAGNOSIS — Z6841 Body Mass Index (BMI) 40.0 and over, adult: Secondary | ICD-10-CM | POA: Diagnosis not present

## 2018-01-01 DIAGNOSIS — F319 Bipolar disorder, unspecified: Secondary | ICD-10-CM | POA: Diagnosis present

## 2018-01-01 DIAGNOSIS — I5043 Acute on chronic combined systolic (congestive) and diastolic (congestive) heart failure: Secondary | ICD-10-CM | POA: Diagnosis present

## 2018-01-01 DIAGNOSIS — Z808 Family history of malignant neoplasm of other organs or systems: Secondary | ICD-10-CM | POA: Diagnosis not present

## 2018-01-01 DIAGNOSIS — F1410 Cocaine abuse, uncomplicated: Secondary | ICD-10-CM | POA: Diagnosis present

## 2018-01-01 DIAGNOSIS — Z794 Long term (current) use of insulin: Secondary | ICD-10-CM | POA: Diagnosis not present

## 2018-01-01 DIAGNOSIS — Z9114 Patient's other noncompliance with medication regimen: Secondary | ICD-10-CM | POA: Diagnosis not present

## 2018-01-01 LAB — URINALYSIS, ROUTINE W REFLEX MICROSCOPIC
Bacteria, UA: NONE SEEN
Bilirubin Urine: NEGATIVE
Glucose, UA: >=500 mg/dL — AB
Ketones, ur: NEGATIVE mg/dL
Leukocytes, UA: NEGATIVE
Nitrite: NEGATIVE
Protein, ur: 100 mg/dL — AB
Specific Gravity, Urine: 1.015 (ref 1.005–1.030)
pH: 6 (ref 5.0–8.0)

## 2018-01-01 LAB — BASIC METABOLIC PANEL
Anion gap: 8 (ref 5–15)
BUN: 10 mg/dL (ref 6–20)
CO2: 28 mmol/L (ref 22–32)
Calcium: 8.9 mg/dL (ref 8.9–10.3)
Chloride: 100 mmol/L (ref 98–111)
Creatinine, Ser: 0.98 mg/dL (ref 0.61–1.24)
GFR calc Af Amer: >60 mL/min (ref >60)
GFR calc non Af Amer: >60 mL/min (ref >60)
Glucose, Bld: 222 mg/dL — ABNORMAL HIGH (ref 70–99)
Potassium: 3.9 mmol/L (ref 3.5–5.1)
Sodium: 136 mmol/L (ref 135–145)

## 2018-01-01 LAB — RAPID URINE DRUG SCREEN, HOSP PERFORMED
Amphetamines: NOT DETECTED
Barbiturates: NOT DETECTED
Benzodiazepines: NOT DETECTED
Cocaine: POSITIVE — AB
Opiates: POSITIVE — AB
Tetrahydrocannabinol: NOT DETECTED

## 2018-01-01 LAB — GLUCOSE, CAPILLARY
Glucose-Capillary: 194 mg/dL — ABNORMAL HIGH (ref 70–99)
Glucose-Capillary: 229 mg/dL — ABNORMAL HIGH (ref 70–99)
Glucose-Capillary: 315 mg/dL — ABNORMAL HIGH (ref 70–99)
Glucose-Capillary: 355 mg/dL — ABNORMAL HIGH (ref 70–99)

## 2018-01-01 LAB — HEMOGLOBIN A1C
Hgb A1c MFr Bld: 10.3 % — ABNORMAL HIGH (ref 4.8–5.6)
Mean Plasma Glucose: 248.91 mg/dL

## 2018-01-01 MED ORDER — ASPIRIN EC 81 MG PO TBEC
81.0000 mg | DELAYED_RELEASE_TABLET | Freq: Every day | ORAL | Status: DC
  Administered 2018-01-01 – 2018-01-02 (×2): 81 mg via ORAL
  Filled 2018-01-01 (×2): qty 1

## 2018-01-01 MED ORDER — ACETAMINOPHEN 325 MG PO TABS
650.0000 mg | ORAL_TABLET | Freq: Four times a day (QID) | ORAL | Status: DC | PRN
  Filled 2018-01-01: qty 2

## 2018-01-01 MED ORDER — ATORVASTATIN CALCIUM 20 MG PO TABS
20.0000 mg | ORAL_TABLET | Freq: Every day | ORAL | Status: DC
  Administered 2018-01-01: 20 mg via ORAL
  Filled 2018-01-01: qty 1

## 2018-01-01 MED ORDER — IBUPROFEN 200 MG PO TABS
400.0000 mg | ORAL_TABLET | Freq: Four times a day (QID) | ORAL | Status: DC | PRN
  Administered 2018-01-01 – 2018-01-02 (×3): 400 mg via ORAL
  Filled 2018-01-01 (×3): qty 2

## 2018-01-01 NOTE — Progress Notes (Signed)
Progress Note    [name]  TKW:[id number] DOB: [birth year]  DOA: 12/31/2017 PCP: [facility]    Brief Narrative:     Medical records reviewed and are as summarized below:  [name] is an 57 y.o. male with past medical history of hypertension, diabetes mellitus, insulin-dependent, with neuropathy, chronic systolic/diastolic CHF, drug abuse, depression, who presents to ED with complaints of lower extremity edema, she is hospitalized at [facility] last August for nontypical chest pain, with 2D echo showing EF 92%, and diastolic dysfunction, with low risk stress test done then, he presents with lower extremity edema, reports he has been developing over last few weeks, reports he stopped taking his Lasix as he did not see his PCP for couple months now, as were reports he is noncompliant with salt restricted diet, he does report some dyspnea, only upon exertion, as well upon laying supine, he has been sleeping on recliner  Assessment/Plan:   Active Problems:   Episodic mood disorder (HCC)   HTN (hypertension), benign   Uncontrolled diabetes mellitus ([place])   Hyperglycemia   Pancytopenia ([place])   Diabetic neuropathy ([place])   Pure hypercholesterolemia   Acute on chronic diastolic CHF (congestive heart failure) ([place])   Acute on chronic diastolic CHF -Most recent echo August 2019 with a preserved EF 42%, and diastolic dysfunction. -Presents with volume overload, evident on imaging, and +3 edema -patient is non compliant with diet and water intake -IV lasix -patient refusing to allow for strict I/Os -monitor daily weights -daily labs  Nontypical chest pain -EKG nonacute, negative troponin, low risk stress test August of this year  Uncontrolled diabetes mellitus -SSI -HgbA1c: 10.3 which is actually improved from 3 months ago (13.1)   Hypertension -continue home meds  Hyperlipidemia -statin  Hyponatremia -related to hyperglycemia and  volume overload -corrected   History of cocaine abuse -UDS + for cocaine-- denies use  History of depression -continue home meds -denies SI  Diabetic neuropathy -continue home meds  obesity Body mass index is 40.92 kg/m.   Family Communication/Anticipated D/C date and plan/Code Status   DVT prophylaxis: Lovenox ordered. Code Status: Full Code.  Family Communication: none at bedside Disposition Plan: d/c once diuresed   Medical Consultants:    None.     Subjective:   Says he takes all his meds, watches his salt intake but his cardiologist in [place] said he could eat anything he wanted in moderation-- also does not follow fluid restrictions  Objective:    Vitals:   01/01/18 0057 01/01/18 0500 01/01/18 0805 01/01/18 1218  BP: 133/90 (!) 145/105 (!) 145/99 (!) 147/100  Pulse: 87 89 82 81  Resp: 18 20 18 20   Temp: 99.2 F (37.3 C) 98.5 F (36.9 C) (!) 97.5 F (36.4 C) 98.4 F (36.9 C)  TempSrc: Oral Oral Oral Oral  SpO2: 98% 100% 100% 95%  Weight:  122.1 kg    Height:        Intake/Output Summary (Last 24 hours) at 01/01/2018 1245 Last data filed at 01/01/2018 0900 Gross per 24 hour  Intake 712 ml  Output -  Net 712 ml   Filed Weights   12/31/17 1124 01/01/18 0500  Weight: 123.4 kg 122.1 kg    Exam: In bed, NAD +LE edema +BS, obese Alert Poor insight into disease process Diminished breath sounds  Data Reviewed:   I have personally reviewed following labs and imaging studies:  Labs: Labs show the following:  Basic Metabolic Panel: Recent Labs  Lab 12/31/17 1133 01/01/18 0741  NA 133* 136  K 3.9 3.9  CL 100 100  CO2 23 28  GLUCOSE 401* 222*  BUN 10 10  CREATININE 0.88 0.98  CALCIUM 8.8* 8.9   GFR Estimated Creatinine Clearance: 107 mL/min (by C-G formula based on SCr of 0.98 mg/dL). Liver Function Tests: Recent Labs  Lab 12/31/17 1133  AST 23  ALT 15  ALKPHOS 75  BILITOT 0.7  PROT 6.6  ALBUMIN 3.0*   No  results for input(s): LIPASE, AMYLASE in the last 168 hours. No results for input(s): AMMONIA in the last 168 hours. Coagulation profile No results for input(s): INR, PROTIME in the last 168 hours.  CBC: Recent Labs  Lab 12/31/17 1133  WBC 3.0*  NEUTROABS 1.4*  HGB 11.4*  HCT 35.5*  MCV 81.4  PLT 188   Cardiac Enzymes: Recent Labs  Lab 12/31/17 1133  TROPONINI <0.03   BNP (last 3 results) No results for input(s): PROBNP in the last 8760 hours. CBG: Recent Labs  Lab 12/31/17 1516 12/31/17 1748 12/31/17 2123 01/01/18 0754 01/01/18 1133  GLUCAP 290* 288* 367* 194* 229*   D-Dimer: No results for input(s): DDIMER in the last 72 hours. Hgb A1c: Recent Labs    01/01/18 0741  HGBA1C 10.3*   Lipid Profile: No results for input(s): CHOL, HDL, LDLCALC, TRIG, CHOLHDL, LDLDIRECT in the last 72 hours. Thyroid function studies: No results for input(s): TSH, T4TOTAL, T3FREE, THYROIDAB in the last 72 hours.  Invalid input(s): FREET3 Anemia work up: No results for input(s): VITAMINB12, FOLATE, FERRITIN, TIBC, IRON, RETICCTPCT in the last 72 hours. Sepsis Labs: Recent Labs  Lab 12/31/17 1133  WBC 3.0*    Microbiology No results found for this or any previous visit (from the past 240 hour(s)).  Procedures and diagnostic studies:  Dg Chest 2 View  Result Date: 12/31/2017 CLINICAL DATA:  Chronic shortness of breath. EXAM: CHEST - 2 VIEW COMPARISON:  09/29/2017. FINDINGS: Cardiomegaly with mild bilateral interstitial prominence. Findings suggest mild CHF. Mild basilar atelectasis. No acute bony abnormality. IMPRESSION: Cardiomegaly with mild bilateral interstitial prominence consistent with mild CHF. Electronically Signed   By: Marcello Moores  Register   On: 12/31/2017 12:53    Medications:   . amLODipine  5 mg Oral Daily  . benazepril  10 mg Oral Daily  . enoxaparin (LOVENOX) injection  60 mg Subcutaneous Q24H  . furosemide  60 mg Intravenous Q8H  . insulin aspart  0-15  Units Subcutaneous TID WC  . insulin aspart  0-5 Units Subcutaneous QHS  . insulin glargine  40 Units Subcutaneous QHS   Continuous Infusions:   LOS: 0 days   Geradine Girt  [facility]   *Please refer to [email or web address], password [email or web address] to get updated schedule on who will round on this patient, as hospitalists switch teams weekly. If 7PM-7AM, please contact night-coverage at [email or web address], password [email or web address] for any overnight needs.  01/01/2018, 12:45 PM

## 2018-01-01 NOTE — Progress Notes (Signed)
Patient refused to use his urinal in order for staff to measure his urine output.

## 2018-01-01 NOTE — Progress Notes (Signed)
Patient requesting for pain medication. Text paged K. Schorr @ [phone]. Second pager sent to Dr Eliseo Squires @[phone] 

## 2018-01-01 NOTE — Progress Notes (Signed)
Pt refuses be alarm. Discussed in depth with pt safety concerns regarding his recent falls. Pt states he understands staff concerns but continues to refuse low bed, bed alarm and floor mats.

## 2018-01-01 NOTE — Progress Notes (Signed)
Patient expresses frustration regarding his diet. Patient states he tried to call in his lunch but he doesn't like any of the options that he can have. Diet education regarding salt intake given. Patient cuts this RN off in the middle of discussion and states "I understand all of that" and then continues to state he doesn't like any of the food here. Dietitian consult pending.

## 2018-01-01 NOTE — Care Management Note (Signed)
Case Management Note  Patient Details  Name: [name] MRN: [id number] Date of Birth: 1960/03/12  Subjective/Objective:    CHF               Action/Plan: Patient lives alone, independent of his ADL's; goes to [facility] for primary care; has private insurance with Medicare; pharmacy [facility] choice os [facility] on [address]; patient stated that he completed the drug rehab program at [facility]; CM asked if he wanted to see th SW again for more resources, pt stated "no." CM will continue to follow for progression of care. Mindi Slicker [facility] [phone]  Expected Discharge Date:     Possibly 01/04/2018             Expected Discharge Plan:  Home/Self Care  Discharge planning Services  CM Consult  Status of Service:  In process, will continue to follow  Sherrilyn Rist [phone] 01/01/2018, 11:29 AM

## 2018-01-01 NOTE — Clinical Social Work Note (Signed)
CSW acknowledges consult that patient has no support system. Per RNCM note, patient does not want to see CSW for resources.  CSW signing off. Consult again if any other social work needs.  Dayton Scrape, [place]

## 2018-01-02 DIAGNOSIS — I5033 Acute on chronic diastolic (congestive) heart failure: Secondary | ICD-10-CM

## 2018-01-02 DIAGNOSIS — I10 Essential (primary) hypertension: Secondary | ICD-10-CM

## 2018-01-02 DIAGNOSIS — E1165 Type 2 diabetes mellitus with hyperglycemia: Secondary | ICD-10-CM

## 2018-01-02 LAB — BASIC METABOLIC PANEL
Anion gap: 9 (ref 5–15)
BUN: 15 mg/dL (ref 6–20)
CO2: 27 mmol/L (ref 22–32)
Calcium: 9 mg/dL (ref 8.9–10.3)
Chloride: 98 mmol/L (ref 98–111)
Creatinine, Ser: 1.01 mg/dL (ref 0.61–1.24)
GFR calc Af Amer: >60 mL/min (ref >60)
GFR calc non Af Amer: >60 mL/min (ref >60)
Glucose, Bld: 260 mg/dL — ABNORMAL HIGH (ref 70–99)
Potassium: 3.8 mmol/L (ref 3.5–5.1)
Sodium: 134 mmol/L — ABNORMAL LOW (ref 135–145)

## 2018-01-02 LAB — CBC
HCT: 40 % (ref 39.0–52.0)
Hemoglobin: 12.4 g/dL — ABNORMAL LOW (ref 13.0–17.0)
MCH: 25.1 pg — ABNORMAL LOW (ref 26.0–34.0)
MCHC: 31 g/dL (ref 30.0–36.0)
MCV: 81 fL (ref 80.0–100.0)
Platelets: 213 10*3/uL (ref 150–400)
RBC: 4.94 MIL/uL (ref 4.22–5.81)
RDW: 13.5 % (ref 11.5–15.5)
WBC: 3 10*3/uL — ABNORMAL LOW (ref 4.0–10.5)
nRBC: 0 % (ref 0.0–0.2)

## 2018-01-02 LAB — GLUCOSE, CAPILLARY: Glucose-Capillary: 237 mg/dL — ABNORMAL HIGH (ref 70–99)

## 2018-01-02 MED ORDER — FUROSEMIDE 40 MG PO TABS: 60.0000 mg | ORAL_TABLET | Freq: Two times a day (BID) | ORAL | 0 refills | Status: DC

## 2018-01-02 NOTE — Progress Notes (Signed)
Pt refuses to call out with bed alarm on. Pt gets up without calling out. Pt high fall risk. Pt educated regarding the importance of not getting up without help. Pt states " I don't care, I don't need your help".

## 2018-01-02 NOTE — Progress Notes (Signed)
Discharge information given to pt. Pt refuses education and paperwork. IV taken out and volunteers called to take pt out.

## 2018-01-02 NOTE — Discharge Summary (Signed)
Physician Discharge Summary  [name] NTI:[id number] DOB: [birth year] DOA: 12/31/2017  [facility]P: [facility]  Admit date: 12/31/2017 Discharge date: 01/02/2018  Admitted From: Home Disposition: Home  Recommendations for Outpatient Follow-up:  1. Follow up with PCP in a week with repeat BMP 2. Outpatient follow-up with cardiology 3. Comply with medications and diet 4. Abstain from illicit drugs 5. Follow-up in the ED if symptoms worsen or new appear   Home Health: No Equipment/Devices: None  Discharge Condition: Stable CODE STATUS: Full Diet recommendation: Heart Healthy / Carb Modified /fluid restriction of up to 1500 cc a day  Brief/Interim Summary: 57 y.o. male with past medical history of hypertension, diabetes mellitus, insulin-dependent, with neuropathy, chronic systolic/diastolic CHF, drug abuse, depression Presented with worsening lower extremity edema.  He was admitted with acute on chronic diastolic CHF and started on IV Lasix.  During the hospitalization, patient was refusing to allow for strict input and output and was also not willing to be compliant with diet.  He is currently stable and will be discharged on oral Lasix.  Outpatient follow-up with cardiology   Discharge Diagnoses:  Active Problems:   Episodic mood disorder (HCC)   HTN (hypertension), benign   Uncontrolled diabetes mellitus (HCC)   Hyperglycemia   Pancytopenia (HCC)   Diabetic neuropathy (HCC)   Pure hypercholesterolemia   Acute on chronic diastolic CHF (congestive heart failure) ([place])  Acute on chronic diastolic heart failure -Echo in August of the 19 showed EF of 86% and diastolic dysfunction -Treated with intravenous Lasix.  Patient was refusing to allow for strict input and output and was not willing to be compliant with diet. -Patient needs to be compliant with diet, medications and follow-up.  Fluid restriction of up to 1500 cc a day -Discharge home on oral Lasix 60 mg  twice a day.  Follow-up BMP within a week by primary care provider.  Outpatient follow-up with cardiology -Continue benazepril  Atypical chest pain -Resolved.  EKG nonacute.  Negative troponin.  Low risk stress test in August of this year.  Outpatient follow-up with cardiology  Uncontrolled diabetes mellitus -Continue home regimen.  Outpatient follow-up next  Hypertension -Continue home regimen along with oral Lasix  Hyperlipidemia -Continue statin  Bone natremia Improving next  History of cocaine abuse -UDS positive for cocaine.  Patient denies use.  Patient is to be abstinent from illicit drugs  Obesity -Outpatient follow-up    Discharge Instructions  Discharge Instructions    (HEART FAILURE PATIENTS) Call MD:  Anytime you have any of the following symptoms: 1) 3 pound weight gain in 24 hours or 5 pounds in 1 week 2) shortness of breath, with or without a dry hacking cough 3) swelling in the hands, feet or stomach 4) if you have to sleep on extra pillows at night in order to breathe.   Complete by:  As directed    Ambulatory referral to Cardiology   Complete by:  As directed    CHF exacerbation/non compliance   Call MD for:  difficulty breathing, headache or visual disturbances   Complete by:  As directed    Call MD for:  extreme fatigue   Complete by:  As directed    Call MD for:  hives   Complete by:  As directed    Call MD for:  persistant dizziness or light-headedness   Complete by:  As directed    Call MD for:  persistant nausea and vomiting   Complete by:  As  directed    Call MD for:  severe uncontrolled pain   Complete by:  As directed    Call MD for:  temperature >100.4   Complete by:  As directed    Diet - low sodium heart healthy   Complete by:  As directed    Diet Carb Modified   Complete by:  As directed    Increase activity slowly   Complete by:  As directed      Allergies as of 01/02/2018      Reactions   Other Anaphylaxis   Allergy to GRITS ,  unknown exactly the specific ingredient he is allergic too      Medication List    STOP taking these medications   buPROPion 300 MG 24 hr tablet Commonly known as:  WELLBUTRIN XL   gabapentin 300 MG capsule Commonly known as:  NEURONTIN   metFORMIN 1000 MG tablet Commonly known as:  GLUCOPHAGE     TAKE these medications   amLODipine 5 MG tablet Commonly known as:  NORVASC Take 1 tablet (5 mg total) by mouth daily.   aspirin 81 MG EC tablet Take 1 tablet (81 mg total) by mouth daily.   atorvastatin 40 MG tablet Commonly known as:  LIPITOR Take 0.5 tablets (20 mg total) by mouth daily at 6 PM.   benazepril 10 MG tablet Commonly known as:  LOTENSIN Take 1 tablet (10 mg total) by mouth daily.   furosemide 40 MG tablet Commonly known as:  LASIX Take 1.5 tablets (60 mg total) by mouth 2 (two) times daily.   TRESIBA FLEXTOUCH 100 UNIT/ML Sopn FlexTouch Pen Generic drug:  insulin degludec Inject 40 Units into the skin every evening.      Follow-up Information    [facility]. Schedule an appointment as soon as possible for a visit today.   Specialty:  Family Medicine Why:  with repeat bmp Contact information: [address] [place] [address] [phone]          Allergies  Allergen Reactions  . Other Anaphylaxis    Allergy to GRITS , unknown exactly the specific ingredient he is allergic too    Consultations:  None   Procedures/Studies: Dg Chest 2 View  Result Date: 12/31/2017 CLINICAL DATA:  Chronic shortness of breath. EXAM: CHEST - 2 VIEW COMPARISON:  09/29/2017. FINDINGS: Cardiomegaly with mild bilateral interstitial prominence. Findings suggest mild CHF. Mild basilar atelectasis. No acute bony abnormality. IMPRESSION: Cardiomegaly with mild bilateral interstitial prominence consistent with mild CHF. Electronically Signed   By: Marcello Moores  Register   On: 12/31/2017 12:53      Subjective: Patient seen and  examined at bedside.  Still complains of lower extremity swelling but is okay to go home.  Nursing staff reports noncompliant with diet.  No overnight fever or vomiting.  Discharge Exam: Vitals:   01/01/18 2008 01/02/18 0530  BP: (!) 139/100 (!) 143/93  Pulse: 83 82  Resp: 20   Temp: 98.3 F (36.8 C) 98.6 F (37 C)  SpO2: 99% 99%   Vitals:   01/01/18 1631 01/01/18 2008 01/02/18 0530 01/02/18 0835  BP: (!) 124/92 (!) 139/100 (!) 143/93   Pulse: 83 83 82   Resp: 20 20    Temp: 98.1 F (36.7 C) 98.3 F (36.8 C) 98.6 F (37 C)   TempSrc: Oral Oral Oral   SpO2: 100% 99% 99%   Weight:    120.8 kg  Height:  General: Pt is alert, awake, not in acute distress Cardiovascular: rate controlled, S1/S2 + Respiratory: bilateral decreased breath sounds at bases, scattered crackles Abdominal: Soft, NT, ND, bowel sounds + Extremities: 2-3+ edema, no cyanosis    The results of significant diagnostics from this hospitalization (including imaging, microbiology, ancillary and laboratory) are listed below for reference.     Microbiology: No results found for this or any previous visit (from the past 240 hour(s)).   Labs: BNP (last 3 results) Recent Labs    12/31/17 1133  BNP 37.1   Basic Metabolic Panel: Recent Labs  Lab 12/31/17 1133 01/01/18 0741 01/02/18 0351  NA 133* 136 134*  K 3.9 3.9 3.8  CL 100 100 98  CO2 23 28 27   GLUCOSE 401* 222* 260*  BUN 10 10 15   CREATININE 0.88 0.98 1.01  CALCIUM 8.8* 8.9 9.0   Liver Function Tests: Recent Labs  Lab 12/31/17 1133  AST 23  ALT 15  ALKPHOS 75  BILITOT 0.7  PROT 6.6  ALBUMIN 3.0*   No results for input(s): LIPASE, AMYLASE in the last 168 hours. No results for input(s): AMMONIA in the last 168 hours. CBC: Recent Labs  Lab 12/31/17 1133 01/02/18 0351  WBC 3.0* 3.0*  NEUTROABS 1.4*  --   HGB 11.4* 12.4*  HCT 35.5* 40.0  MCV 81.4 81.0  PLT 188 213   Cardiac Enzymes: Recent Labs  Lab 12/31/17 1133   TROPONINI <0.03   BNP: Invalid input(s): POCBNP CBG: Recent Labs  Lab 01/01/18 0754 01/01/18 1133 01/01/18 1634 01/01/18 2105 01/02/18 0802  GLUCAP 194* 229* 315* 355* 237*   D-Dimer No results for input(s): DDIMER in the last 72 hours. Hgb A1c Recent Labs    01/01/18 0741  HGBA1C 10.3*   Lipid Profile No results for input(s): CHOL, HDL, LDLCALC, TRIG, CHOLHDL, LDLDIRECT in the last 72 hours. Thyroid function studies No results for input(s): TSH, T4TOTAL, T3FREE, THYROIDAB in the last 72 hours.  Invalid input(s): FREET3 Anemia work up No results for input(s): VITAMINB12, FOLATE, FERRITIN, TIBC, IRON, RETICCTPCT in the last 72 hours. Urinalysis    Component Value Date/Time   COLORURINE YELLOW 01/01/2018 0653   APPEARANCEUR CLEAR 01/01/2018 0653   APPEARANCEUR Clear 12/31/2013 2116   LABSPEC 1.015 01/01/2018 0653   LABSPEC 1.032 12/31/2013 2116   PHURINE 6.0 01/01/2018 0653   GLUCOSEU >=500 (A) 01/01/2018 0653   GLUCOSEU >=500 12/31/2013 2116   HGBUR SMALL (A) 01/01/2018 0653   BILIRUBINUR NEGATIVE 01/01/2018 0653   BILIRUBINUR Negative 12/31/2013 2116   [place] NEGATIVE 01/01/2018 0653   PROTEINUR 100 (A) 01/01/2018 0653   UROBILINOGEN 1.0 08/28/2013 1709   NITRITE NEGATIVE 01/01/2018 0653   LEUKOCYTESUR NEGATIVE 01/01/2018 0653   LEUKOCYTESUR Negative 12/31/2013 2116   Sepsis Labs Invalid input(s): PROCALCITONIN,  WBC,  LACTICIDVEN Microbiology No results found for this or any previous visit (from the past 240 hour(s)).   Time coordinating discharge: 35 minutes  SIGNED:   Aline August, MD  [facility] 01/02/2018, 11:46 AM Pager: [phone]  If 7PM-7AM, please contact night-coverage [email or web address] Password [email or web address]

## 2018-01-02 NOTE — Progress Notes (Signed)
Inpatient Diabetes Program Recommendations  AACE/ADA: New Consensus Statement on Inpatient Glycemic Control (2015)  Target Ranges:  Prepandial:   less than 140 mg/dL      Peak postprandial:   less than 180 mg/dL (1-2 hours)      Critically ill patients:  140 - 180 mg/dL   Lab Results  Component Value Date   GLUCAP 237 (H) 01/02/2018   HGBA1C 10.3 (H) 01/01/2018    Review of Glycemic Control  Diabetes history: DM2 Outpatient Diabetes medications: Tresiba 40 units QHS, metformin 1000 mg bid (not taking) Current orders for Inpatient glycemic control: Lantus 40 units QHS, Novolog 0-15 units tidwc and hs  HgbA1C of 10.3% improvement from 13% FBS > 180 mg/dL Post-prandials elevated. [facility]CP - [facility] Relapsed with cocaine. States he is taking [place], but not metformin.  Inpatient Diabetes Program Recommendations:     Increase Tresiba to 45 units QHS Add Novolog 3 units tidwc for meal coverage insulin if pt eats > 50% meal.  Will continue to follow.  Thank you. Lorenda Peck, RD, LDN, CDE Inpatient Diabetes Coordinator [phone]

## 2018-01-29 ENCOUNTER — Other Ambulatory Visit: Payer: Self-pay

## 2018-01-29 ENCOUNTER — Emergency Department (HOSPITAL_COMMUNITY)
Admission: EM | Admit: 2018-01-29 | Discharge: 2018-01-29 | Disposition: A | Discharge status: Home / Self Care | Payer: Medicare Other | Attending: Emergency Medicine | Admitting: Emergency Medicine

## 2018-01-29 ENCOUNTER — Encounter (HOSPITAL_COMMUNITY): Payer: Self-pay

## 2018-01-29 DIAGNOSIS — K920 Hematemesis: Secondary | ICD-10-CM | POA: Diagnosis not present

## 2018-01-29 DIAGNOSIS — Z79899 Other long term (current) drug therapy: Secondary | ICD-10-CM | POA: Insufficient documentation

## 2018-01-29 DIAGNOSIS — Z7982 Long term (current) use of aspirin: Secondary | ICD-10-CM | POA: Insufficient documentation

## 2018-01-29 DIAGNOSIS — I509 Heart failure, unspecified: Secondary | ICD-10-CM | POA: Diagnosis not present

## 2018-01-29 DIAGNOSIS — Z87891 Personal history of nicotine dependence: Secondary | ICD-10-CM | POA: Diagnosis not present

## 2018-01-29 DIAGNOSIS — E119 Type 2 diabetes mellitus without complications: Secondary | ICD-10-CM | POA: Diagnosis not present

## 2018-01-29 DIAGNOSIS — I110 Hypertensive heart disease with heart failure: Secondary | ICD-10-CM | POA: Insufficient documentation

## 2018-01-29 DIAGNOSIS — K922 Gastrointestinal hemorrhage, unspecified: Secondary | ICD-10-CM

## 2018-01-29 DIAGNOSIS — K625 Hemorrhage of anus and rectum: Secondary | ICD-10-CM | POA: Diagnosis present

## 2018-01-29 LAB — BASIC METABOLIC PANEL
Anion gap: 9 (ref 5–15)
BUN: 25 mg/dL — ABNORMAL HIGH (ref 6–20)
CO2: 25 mmol/L (ref 22–32)
Calcium: 8.7 mg/dL — ABNORMAL LOW (ref 8.9–10.3)
Chloride: 96 mmol/L — ABNORMAL LOW (ref 98–111)
Creatinine, Ser: 1.11 mg/dL (ref 0.61–1.24)
GFR calc Af Amer: >60 mL/min (ref >60)
GFR calc non Af Amer: >60 mL/min (ref >60)
Glucose, Bld: 342 mg/dL — ABNORMAL HIGH (ref 70–99)
Potassium: 3.8 mmol/L (ref 3.5–5.1)
Sodium: 130 mmol/L — ABNORMAL LOW (ref 135–145)

## 2018-01-29 LAB — CBC WITH DIFFERENTIAL/PLATELET
Abs Immature Granulocytes: 0.01 10*3/uL (ref 0.00–0.07)
Basophils Absolute: 0 10*3/uL (ref 0.0–0.1)
Basophils Relative: 1 %
Eosinophils Absolute: 0.1 10*3/uL (ref 0.0–0.5)
Eosinophils Relative: 1 %
HCT: 40.3 % (ref 39.0–52.0)
Hemoglobin: 12.8 g/dL — ABNORMAL LOW (ref 13.0–17.0)
Immature Granulocytes: 0 %
Lymphocytes Relative: 47 %
Lymphs Abs: 2 10*3/uL (ref 0.7–4.0)
MCH: 25.8 pg — ABNORMAL LOW (ref 26.0–34.0)
MCHC: 31.8 g/dL (ref 30.0–36.0)
MCV: 81.3 fL (ref 80.0–100.0)
Monocytes Absolute: 0.5 10*3/uL (ref 0.1–1.0)
Monocytes Relative: 12 %
Neutro Abs: 1.6 10*3/uL — ABNORMAL LOW (ref 1.7–7.7)
Neutrophils Relative %: 39 %
Platelets: 179 10*3/uL (ref 150–400)
RBC: 4.96 MIL/uL (ref 4.22–5.81)
RDW: 13.2 % (ref 11.5–15.5)
WBC: 4.2 10*3/uL (ref 4.0–10.5)
nRBC: 0 % (ref 0.0–0.2)

## 2018-01-29 MED ORDER — OMEPRAZOLE 20 MG PO CPDR: 20.0000 mg | DELAYED_RELEASE_CAPSULE | Freq: Every day | ORAL | 0 refills | Status: DC

## 2018-01-29 MED ORDER — PANTOPRAZOLE SODIUM 40 MG IV SOLR
40.0000 mg | Freq: Once | INTRAVENOUS | Status: AC
  Administered 2018-01-29: 40 mg via INTRAVENOUS
  Filled 2018-01-29: qty 40

## 2018-01-29 MED ORDER — ONDANSETRON HCL 4 MG/2ML IJ SOLN
4.0000 mg | Freq: Once | INTRAMUSCULAR | Status: AC
  Administered 2018-01-29: 4 mg via INTRAVENOUS
  Filled 2018-01-29: qty 2

## 2018-01-29 MED ORDER — MORPHINE SULFATE (PF) 4 MG/ML IV SOLN
4.0000 mg | Freq: Once | INTRAVENOUS | Status: AC
  Administered 2018-01-29: 4 mg via INTRAVENOUS
  Filled 2018-01-29: qty 1

## 2018-01-29 NOTE — ED Triage Notes (Signed)
Per EMS: Pt c/o of rectal bleeding x2 weeks.  Pain has significantly increased in the past 2 days.  Pt went to [facility] this morning and [facility] due to wait times.  Pt states he has been bleeding between BM's and had to change his underwear twice today.  Pt's CBG is also elevated, pt states he has not filled his diabetes Rx.

## 2018-01-29 NOTE — ED Provider Notes (Signed)
[facility] DEPT Provider Note   CSN: [id number] Arrival date & time: 01/29/18  1523     History   Chief Complaint Chief Complaint  Patient presents with  . Rectal Bleeding    HPI [name] is a 57 y.o. male.  Patient presents to the emergency department with a chief complaint of rectal bleeding x2 weeks.  He reports being significantly fatigued.  Reports increased rectal bleeding and pain over the past 2 days.  States that the pain is in his low back, and this is normal for him, but worse.  It is that his blood sugar has been running high, and he has not been able to get his diabetic medication.  He denies any fevers chills.  Denies chest pain or shortness of breath.  Denies any other associated symptoms.  The history is provided by the patient. No language interpreter was used.    Past Medical History:  Diagnosis Date  . Arthritis   . Bipolar affective ([place])   . CHF (congestive heart failure) ([place])   . Depression   . Diabetes mellitus    uncontrolled   . Hypertension     Patient Active Problem List   Diagnosis Date Noted  . Acute on chronic diastolic CHF (congestive heart failure) ([place]) 12/31/2017  . Poorly controlled diabetes mellitus ([place])   . Pure hypercholesterolemia   . Hyperglycemia 09/28/2017  . Nonobstructive atherosclerosis of coronary artery 09/28/2017  . Chronic HFrEF (heart failure with reduced ejection fraction) ([place]) 09/28/2017  . MDD (major depressive disorder), recurrent episode, severe ([place]) 07/30/2017  . Osteoarthritis 04/12/2015  . Severe recurrent major depression with psychotic features ([place]) 01/04/2015  . Substance-related disorder ([place]) 10/30/2014  . Patient's noncompliance with other medical treatment and regimen 06/07/2014  . Cocaine dependence, uncomplicated ([place]) 95/63/8756  . Chronic pain 05/31/2014  . Cocaine abuse ([place]) 05/31/2014  . History of noncompliance with medical treatment 05/31/2014  .  Malingering 05/31/2014  . Pancytopenia ([place]) 05/31/2014  . Diabetic neuropathy associated with diabetes mellitus due to underlying condition ([place]) 05/31/2014  . Diabetic neuropathy ([place]) 05/31/2014  . Congestive heart failure with left ventricular systolic dysfunction ([place]) 02/22/2014  . Depression, major, recurrent, severe with psychosis ([place]) 08/29/2013  . Suicidal ideations 08/28/2013  . Uncontrolled diabetes mellitus ([place]) 06/30/2013  . Atypical chest pain 12/09/2011  . HTN (hypertension), benign 12/09/2011  . Episodic mood disorder ([place]) 05/13/2011    Past Surgical History:  Procedure Laterality Date  . KNEE SURGERY     b/l knees; pending total knee         Home Medications    Prior to Admission medications   Medication Sig Start Date End Date Taking? Authorizing Provider  amLODipine (NORVASC) 10 MG tablet  12/08/17   [provider]  amLODipine (NORVASC) 5 MG tablet Take 1 tablet (5 mg total) by mouth daily. 10/01/17   Seawell, Jaimie A, DO  aspirin EC 81 MG EC tablet Take 1 tablet (81 mg total) by mouth daily. Patient not taking: Reported on 12/31/2017 10/02/17   Molli Hazard A, DO  atorvastatin (LIPITOR) 40 MG tablet Take 0.5 tablets (20 mg total) by mouth daily at 6 PM. Patient not taking: Reported on 12/31/2017 10/01/17   Seawell, Jaimie A, DO  benazepril (LOTENSIN) 10 MG tablet Take 1 tablet (10 mg total) by mouth daily. 10/02/17   Seawell, Jaimie A, DO  clopidogrel (PLAVIX) 75 MG tablet  10/29/17   [provider]  escitalopram (LEXAPRO) 10 MG  tablet  12/08/17   [provider]  furosemide (LASIX) 20 MG tablet  12/15/17   [provider]  furosemide (LASIX) 40 MG tablet Take 1.5 tablets (60 mg total) by mouth 2 (two) times daily. 01/02/18 02/01/18  Aline August, MD  gabapentin (NEURONTIN) 400 MG capsule  01/24/18   [provider]  insulin degludec (TRESIBA FLEXTOUCH) 100 UNIT/ML SOPN FlexTouch Pen Inject 40 Units into the skin  every evening.  05/27/17   [provider]  LEVEMIR FLEXTOUCH 100 UNIT/ML Pen  12/15/17   [provider]  methocarbamol (ROBAXIN) 500 MG tablet  01/24/18   [provider]  mirtazapine (REMERON) 15 MG tablet  12/15/17   [provider]  traZODone (DESYREL) 100 MG tablet  12/08/17   [provider]    Family History Family History  Problem Relation Age of Onset  . Diabetes Unknown   . Cancer Mother        uterus; deceased   . Heart attack Mother 45  . Hypertension Unknown   . Heart attack Father 53  . Stroke Sister        12/2012 age 74 y.o    Social History Social History   Tobacco Use  . Smoking status: Former Research scientist (life sciences)  . Smokeless tobacco: Never Used  Substance Use Topics  . Alcohol use: No  . Drug use: Yes    Types: Cocaine     Allergies   Other   Review of Systems Review of Systems  All other systems reviewed and are negative.    Physical Exam Updated Vital Signs BP (!) 143/95   Pulse 90   Temp 98.1 F (36.7 C) (Oral)   Resp (!) 23   Ht 5\' 8"  (1.727 m)   Wt 123.4 kg   SpO2 100%   BMI 41.36 kg/m   Physical Exam  Constitutional: He is oriented to person, place, and time. He appears well-developed and well-nourished.  HENT:  Head: Normocephalic and atraumatic.  Eyes: Pupils are equal, round, and reactive to light. Conjunctivae and EOM are normal. Right eye exhibits no discharge. Left eye exhibits no discharge. No scleral icterus.  Neck: Normal range of motion. Neck supple. No JVD present.  Cardiovascular: Normal rate, regular rhythm and normal heart sounds. Exam reveals no gallop and no friction rub.  No murmur heard. Pulmonary/Chest: Effort normal and breath sounds normal. No respiratory distress. He has no wheezes. He has no rales. He exhibits no tenderness.  Abdominal: Soft. He exhibits no distension and no mass. There is no tenderness. There is no rebound and no guarding.  Genitourinary:  Genitourinary  Comments: Black stool  Musculoskeletal: Normal range of motion. He exhibits no edema or tenderness.  Neurological: He is alert and oriented to person, place, and time.  Skin: Skin is warm and dry.  Psychiatric: He has a normal mood and affect. His behavior is normal. Judgment and thought content normal.  Nursing note and vitals reviewed.    ED Treatments / Results  Labs (all labs ordered are listed, but only abnormal results are displayed) Labs Reviewed  CBC WITH DIFFERENTIAL/PLATELET - Abnormal; Notable for the following components:      Result Value   Hemoglobin 12.8 (*)    MCH 25.8 (*)    Neutro Abs 1.6 (*)    All other components within normal limits  BASIC METABOLIC PANEL - Abnormal; Notable for the following components:   Sodium 130 (*)    Chloride 96 (*)  Glucose, Bld 342 (*)    BUN 25 (*)    Calcium 8.7 (*)    All other components within normal limits    EKG None  Radiology No results found.  Procedures Procedures (including critical care time)  Medications Ordered in ED Medications  morphine 4 MG/ML injection 4 mg (has no administration in time range)  ondansetron (ZOFRAN) injection 4 mg (has no administration in time range)     Initial Impression / Assessment and Plan / ED Course  I have reviewed the triage vital signs and the nursing notes.  Pertinent labs & imaging results that were available during my care of the patient were reviewed by me and considered in my medical decision making (see chart for details).    Patient having blood in stool for the past day or 2.  Reports an increase in his chronic pain over the past 2 weeks.  His hemoglobin is stable.  Vital signs are stable.  Does not have abdominal tenderness.    Black stool on Hemoccult.  Patient discussed with Dr. Laverta Baltimore, who agrees that patient can follow-up with GI.  Will start PPI.    Discussed plan with patient, who understands and agrees with the plan.    Final Clinical  Impressions(s) / ED Diagnoses   Final diagnoses:  Upper GI bleed    ED Discharge Orders         Ordered    Ambulatory referral to Gastroenterology     01/29/18 1837           Montine Circle, PA-C 01/29/18 1841    Long, Wonda Olds, MD 01/30/18 1549

## 2018-01-29 NOTE — ED Notes (Signed)
Bed: WA09 Expected date:  Expected time:  Means of arrival:  Comments: EMS-blood in stool

## 2018-02-04 ENCOUNTER — Encounter: Payer: Self-pay | Admitting: Gastroenterology

## 2018-03-12 ENCOUNTER — Ambulatory Visit: Payer: Self-pay | Admitting: Gastroenterology

## 2018-03-22 IMAGING — CR DG SACRUM/COCCYX 2+V
3 series · 3 of 3 positions shown · non-contrast
Comparison: None.

CLINICAL DATA: Lumbosacral back pain. Worsening of chronic
right-sided low back pain.

EXAM:
SACRUM AND COCCYX - 2+ VIEW

[coccyx ap]
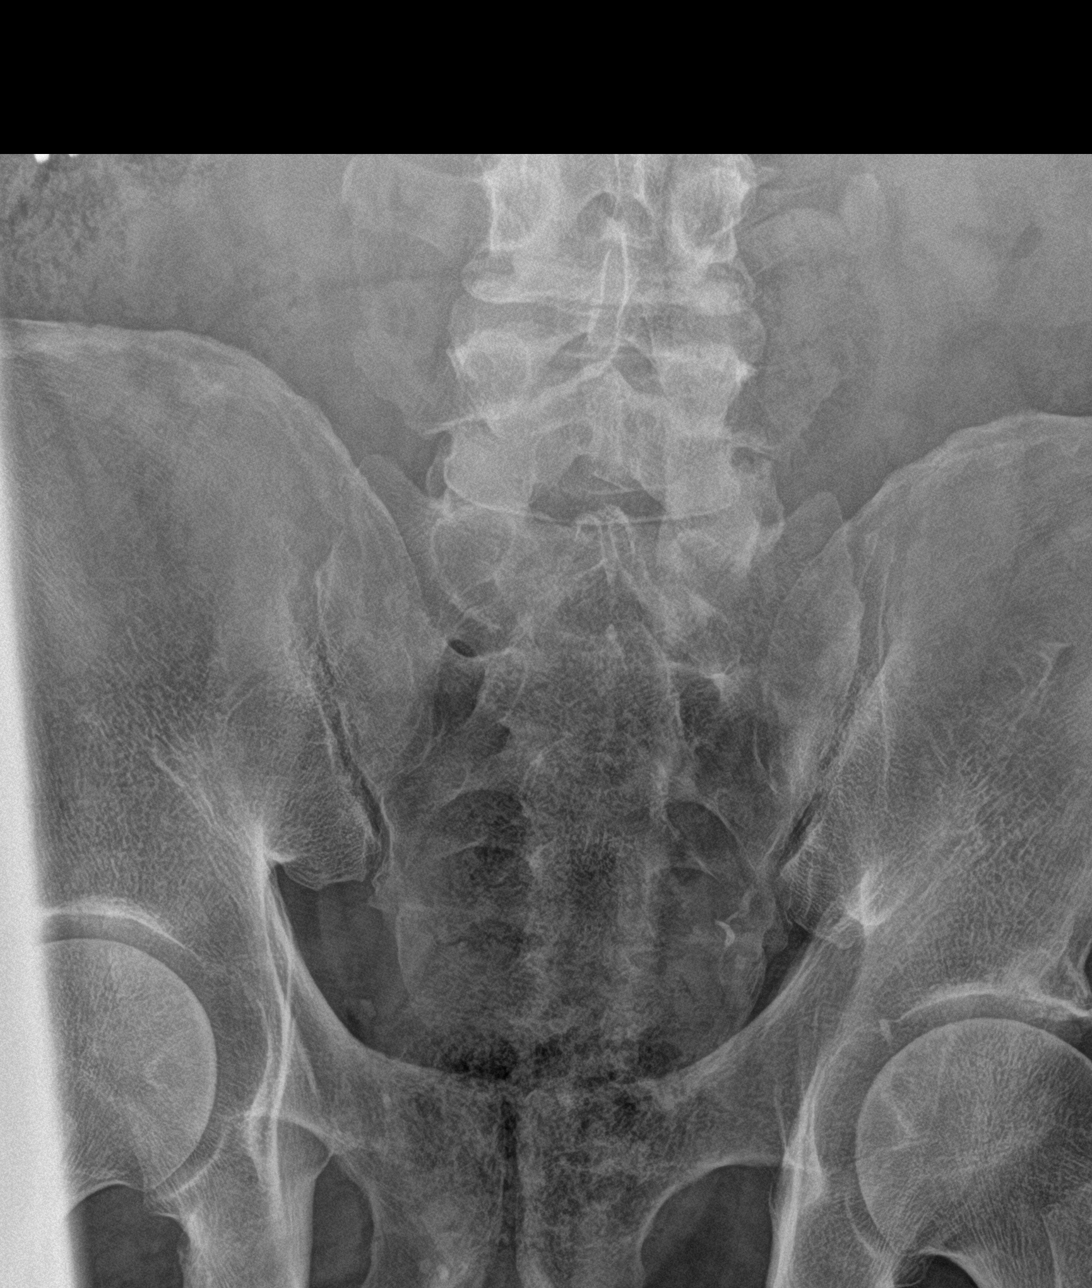

[sacrum ap]
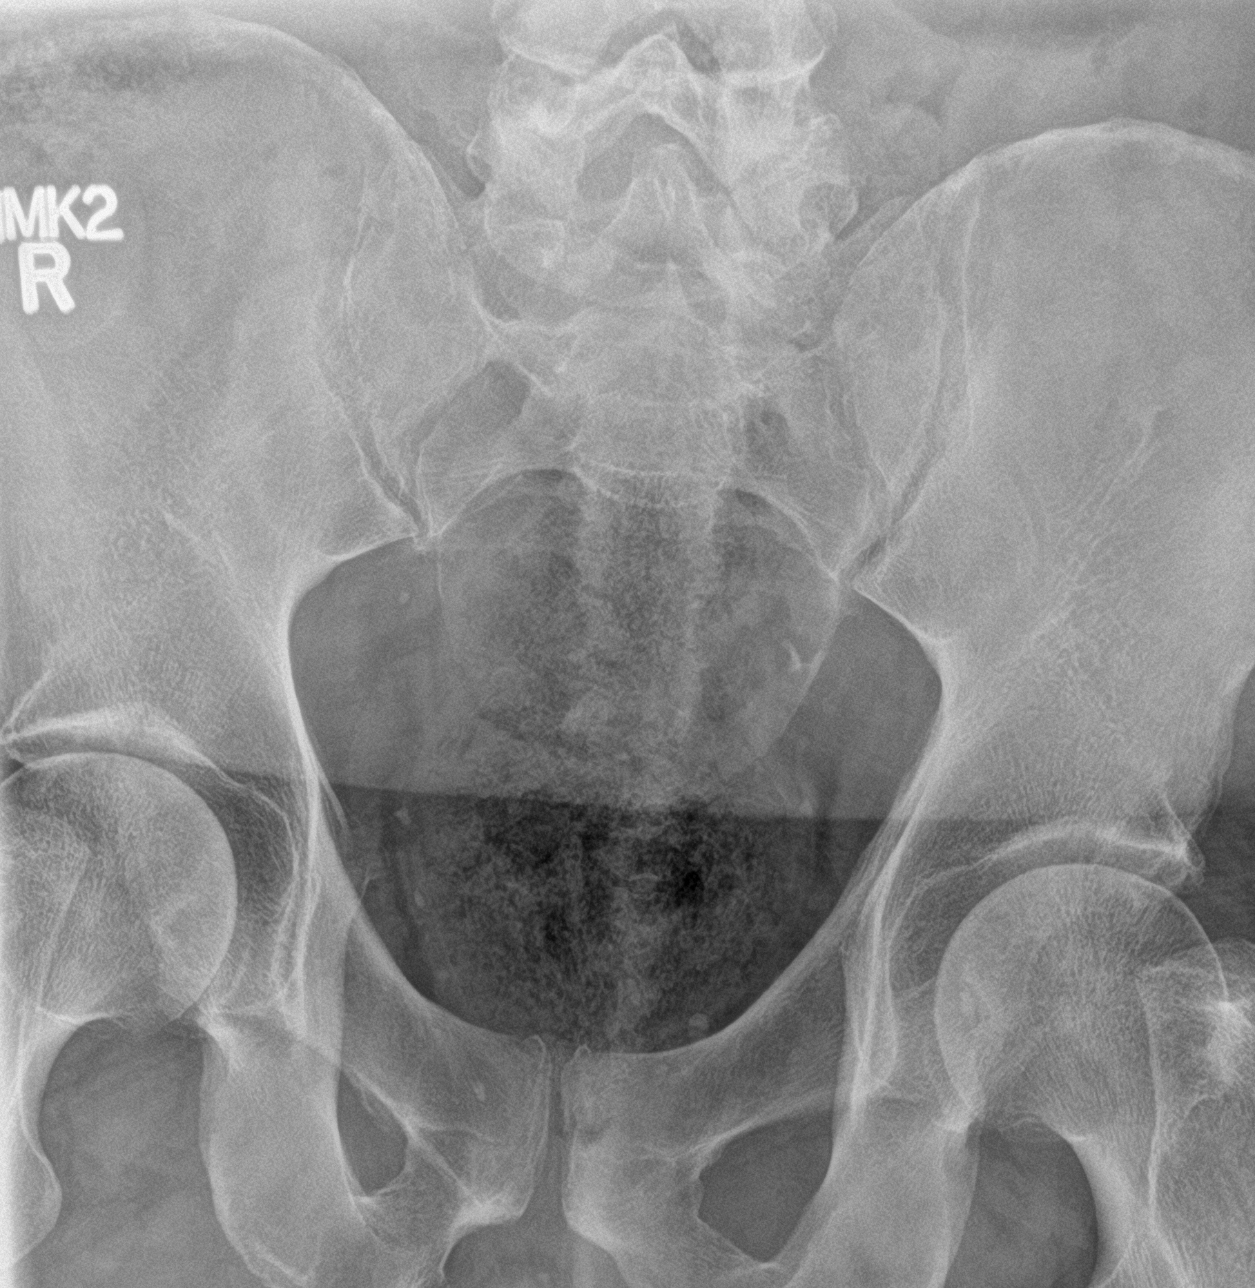

[sacrum lat]
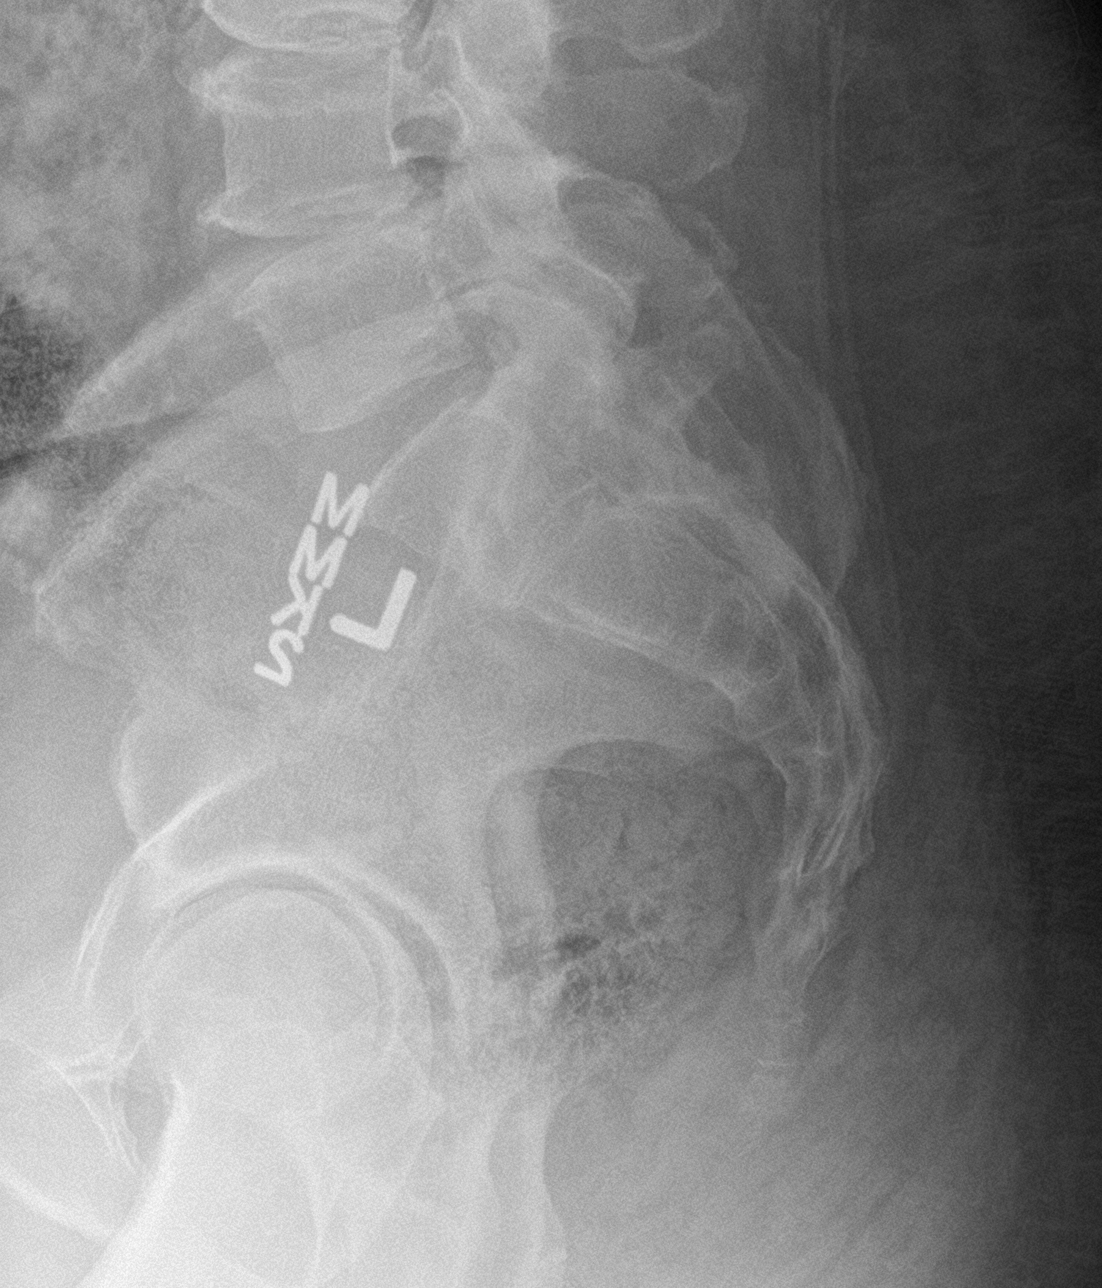

[3 of 3 positions shown; findings below may reference images not displayed]

FINDINGS: There is no evidence of fracture or other focal bone lesions.
Cortical margins of the sacrum are intact. The sacral ala are
maintained. Trace degenerative change of the sacroiliac joints.
IMPRESSION: Minimal degenerative change of both sacroiliac joints. No acute
sacrococcygeal abnormality.

## 2018-03-22 IMAGING — CR DG LUMBAR SPINE COMPLETE 4+V
5 series · 5 of 5 positions shown · non-contrast
Comparison: CT abdomen/ pelvis reformats 03-30-15

CLINICAL DATA: Lumbosacral back pain. Knot on right side. Worsening
of chronic right-sided back pain.

EXAM:
LUMBAR SPINE - COMPLETE 4+ VIEW

[l-spine ap]
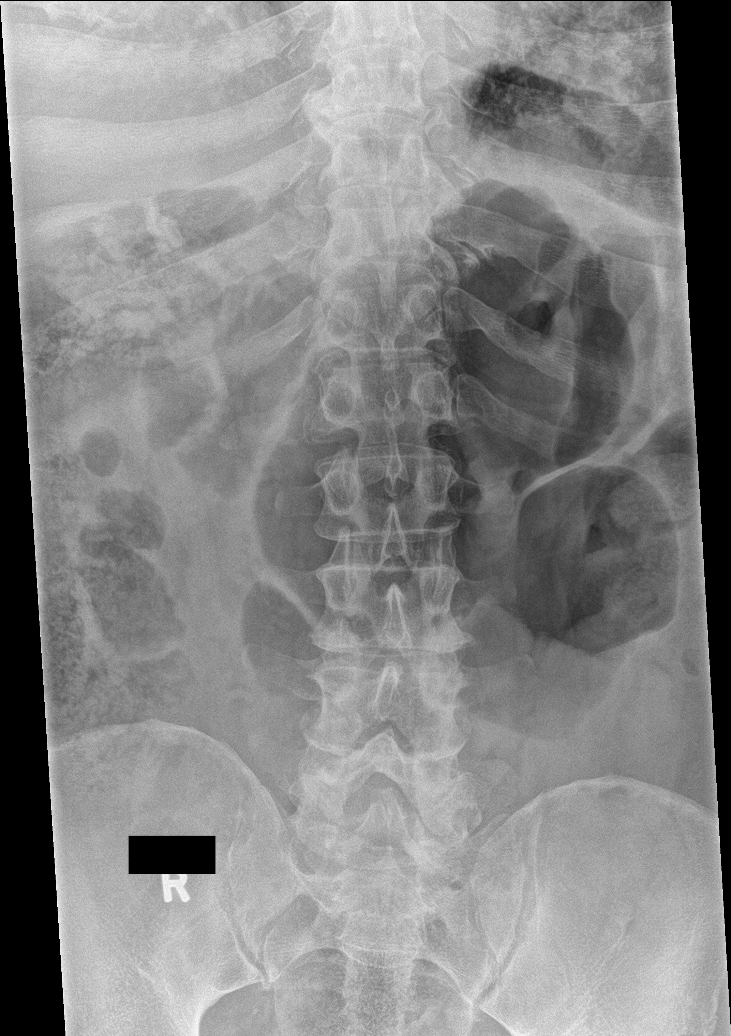

[l-spine obl (1 of 2)]
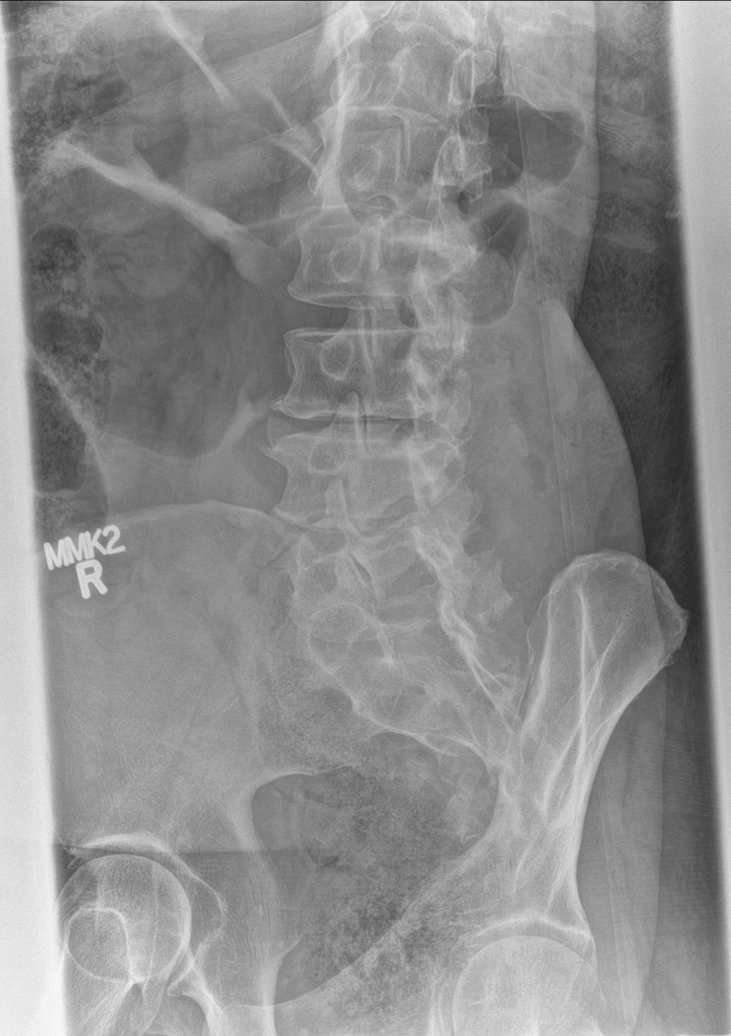

[l-spine obl (2 of 2)]
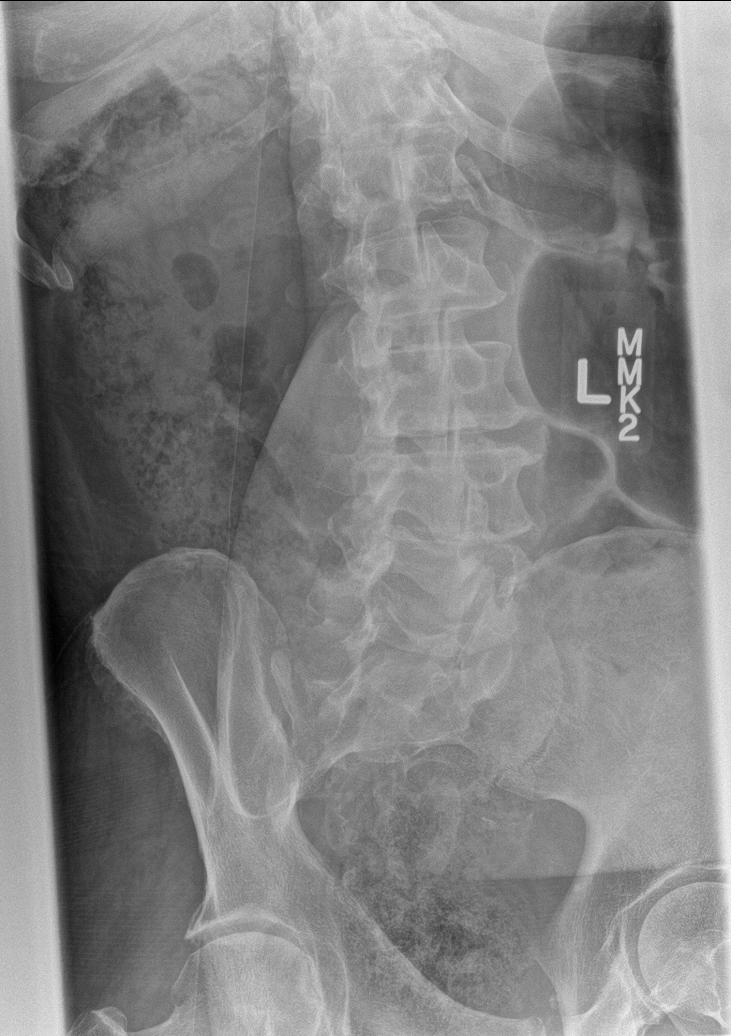

[l-spine lat]
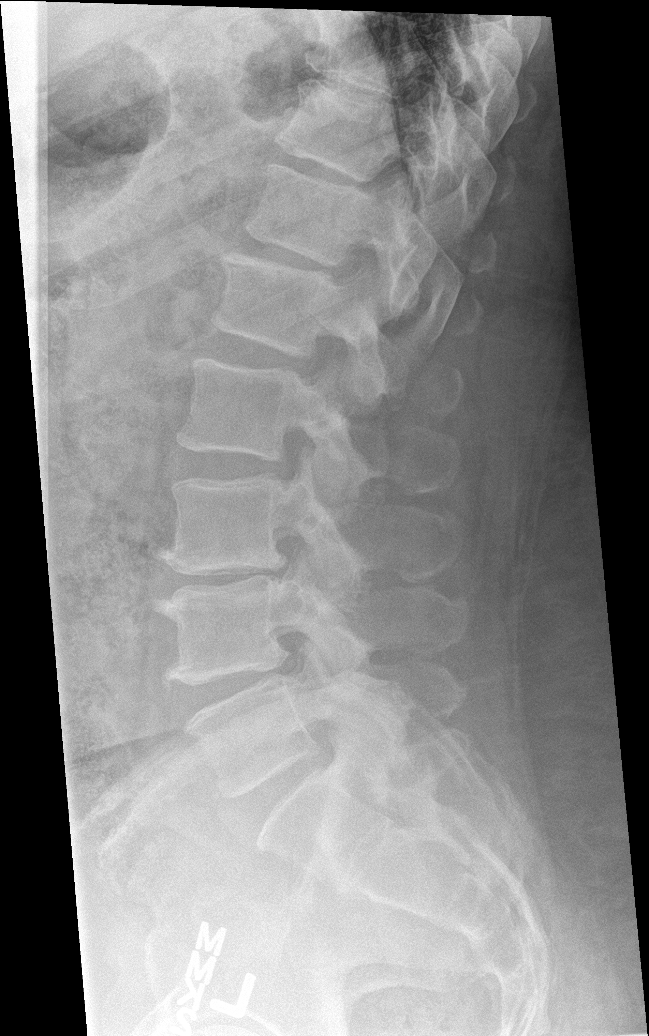

[l-spine spot]
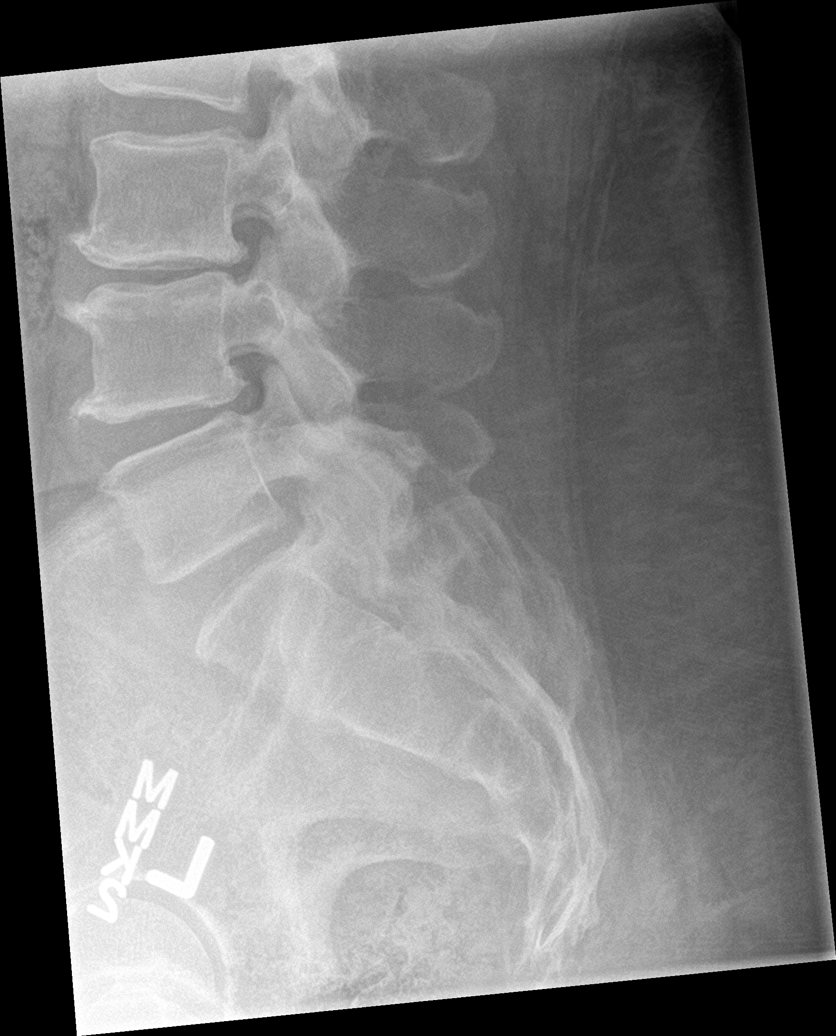

[5 of 5 positions shown; findings below may reference images not displayed]

FINDINGS: Diminutive ribs at L1. Four non-rib-bearing lumbar vertebra. The
lower most non-rib-bearing lumbar vertebra will be labeled L5.
Vertebral body heights are maintained. No acute fracture. Disc space
narrowing at L3-L4 and to a lesser extent L4-L5 with endplate
spurring, similar to prior CT. There is scattered facet arthropathy
most prominent in the lower lumbar spine. Posterior elements are
intact. No focal soft tissue abnormality to explain clinical "knot".
IMPRESSION: Degenerative disc disease and facet arthropathy, grossly stable. No
acute abnormality.

## 2018-07-20 IMAGING — CR DG HIP (WITH OR WITHOUT PELVIS) 2-3V*R*
3 series · 3 of 3 positions shown · non-contrast
Comparison: Multiple exams, including 07/12/2016 and CT scan from
03/30/2015

CLINICAL DATA: Right hip pain after a fall on [REDACTED].

EXAM:
DG HIP (WITH OR WITHOUT PELVIS) 2-3V RIGHT

[pelvis ap]
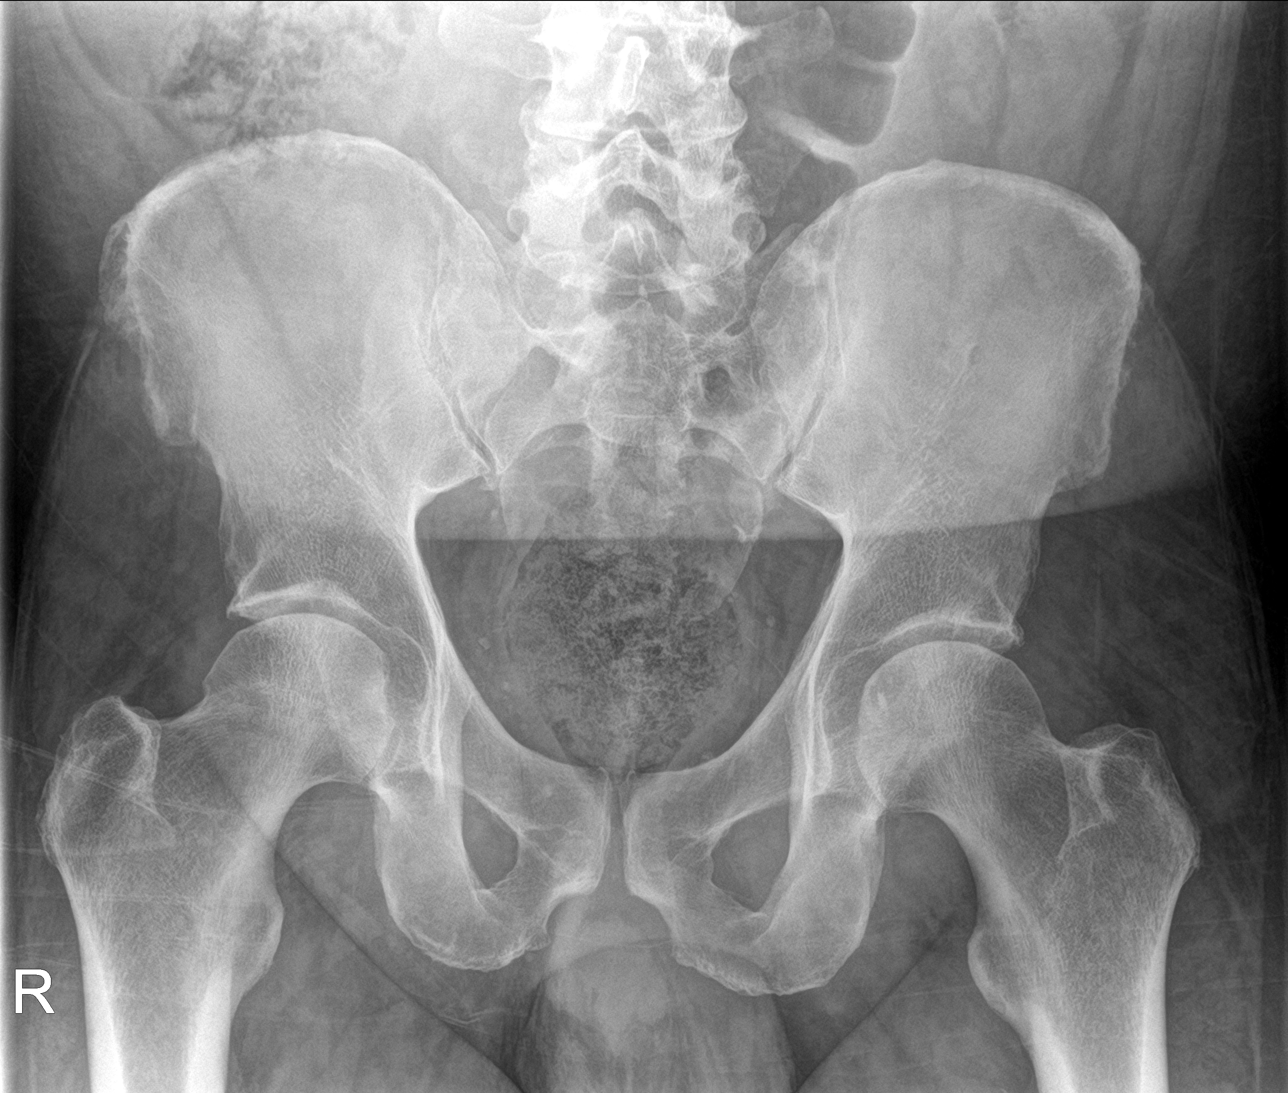

[hip ap]
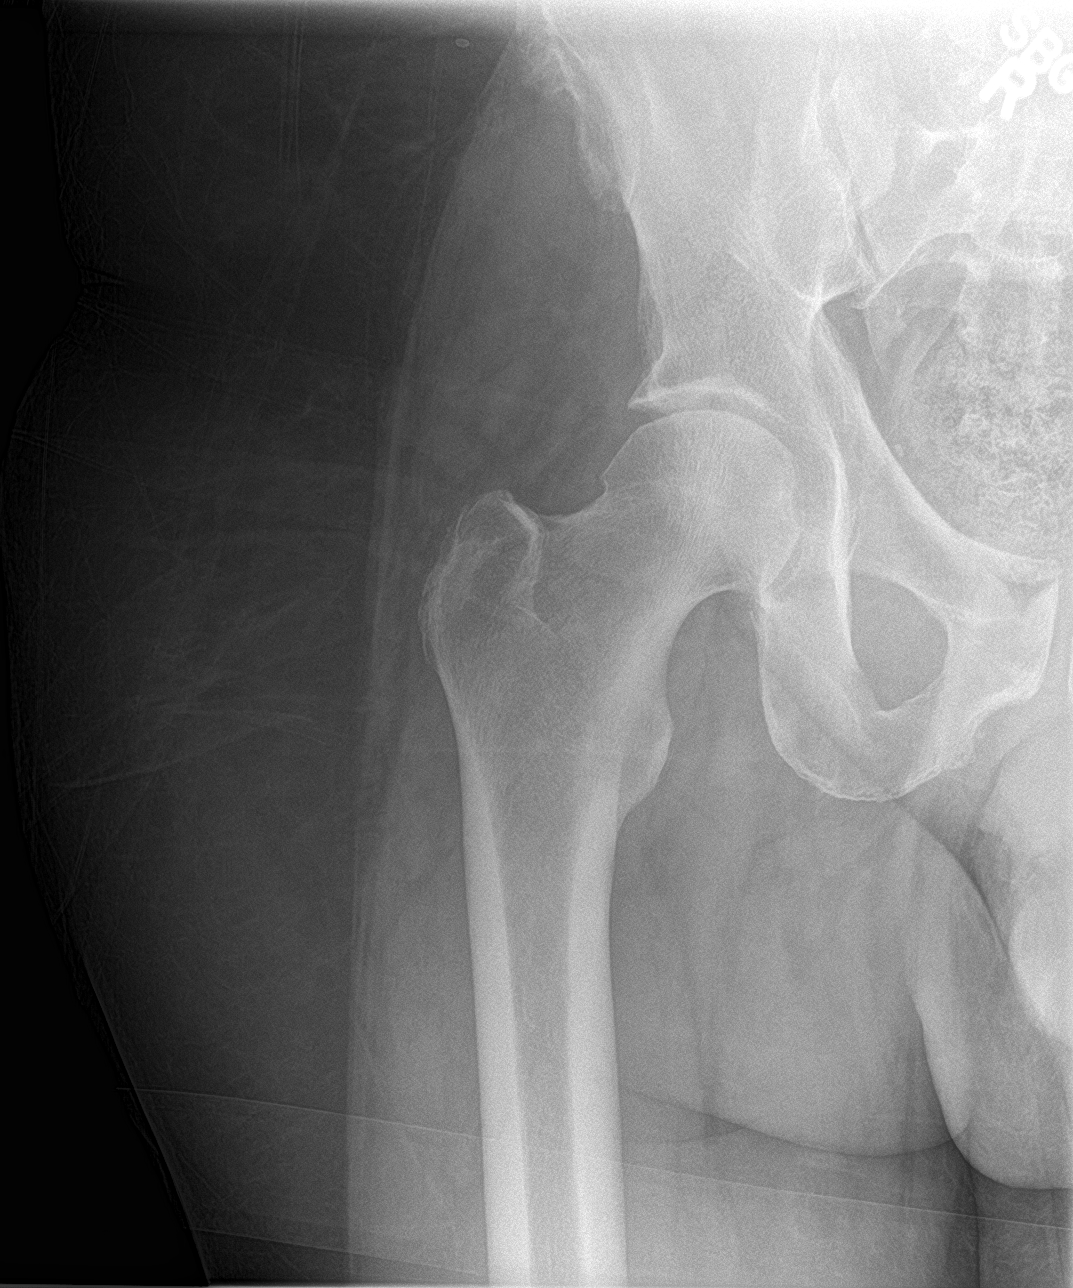

[hip lat]
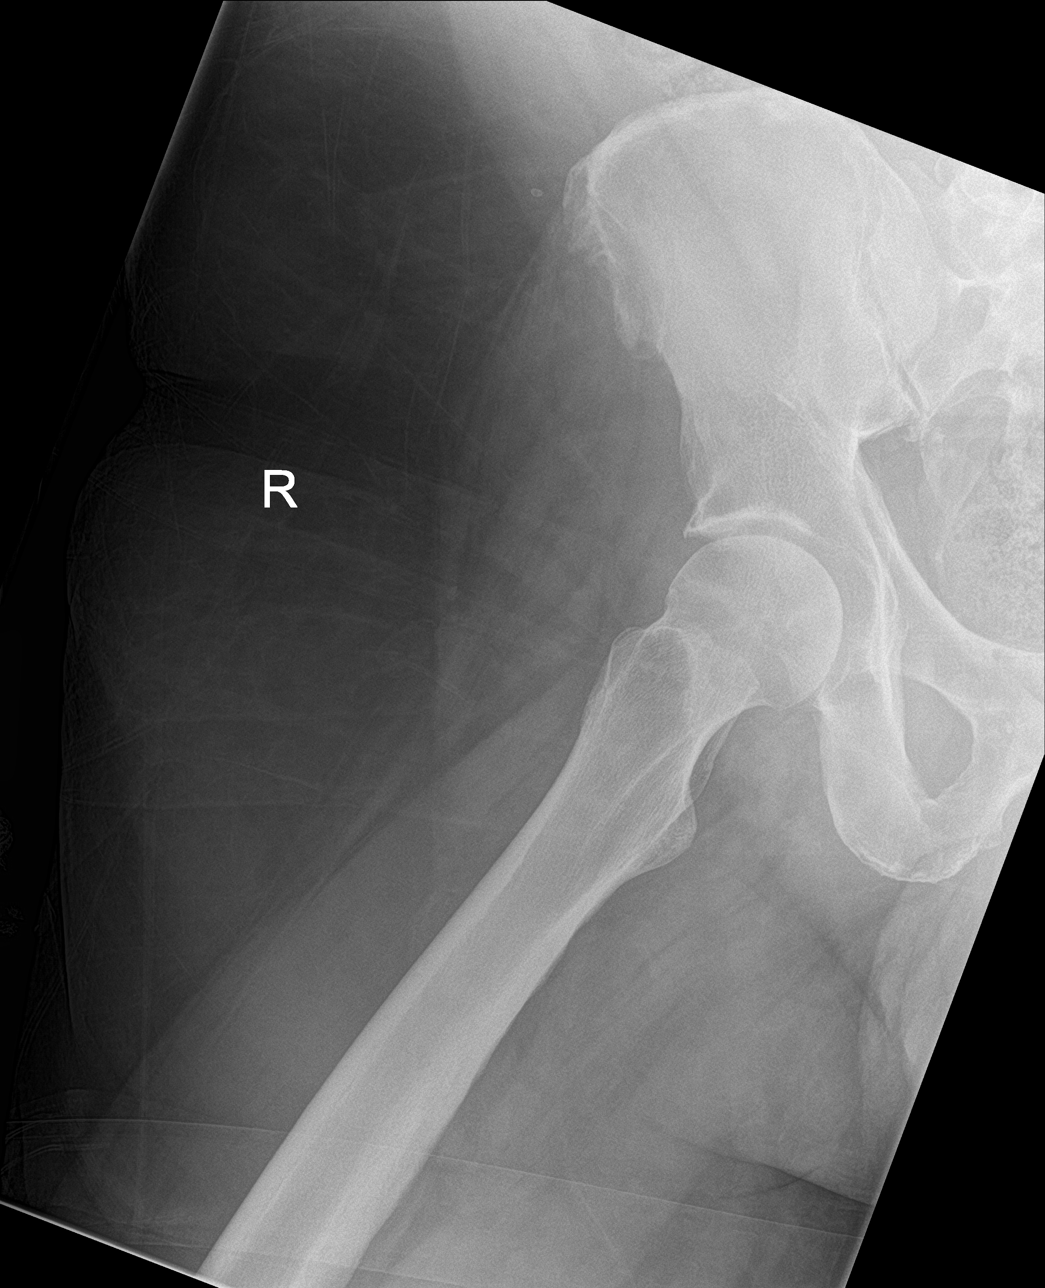

[3 of 3 positions shown; findings below may reference images not displayed]

FINDINGS: Mild spurring of both acetabula and of the femoral heads, right
greater than left. No visible fracture or acute bony findings.
IMPRESSION: 1. Mild degenerative findings the right hip without appreciable
fracture. If pain persists despite conservative therapy, MRI may be
warranted for further characterization.

## 2019-06-08 IMAGING — CT CT HEAD W/O CM
5 of 6 series · 18 of 47 positions shown, 19 images · non-contrast
Comparison: 01/30/2010

CLINICAL DATA: Altered level of consciousness

EXAM:
CT HEAD WITHOUT CONTRAST
TECHNIQUE: Contiguous axial images were obtained from the base of the skull
through the vertex without intravenous contrast.

[Series 3: head without · axial · non-contrast · 0.50mm/px · z∈[+702,+762]mm · 2 of 37 slices shown, 3 images]
[im 13/37  brain]
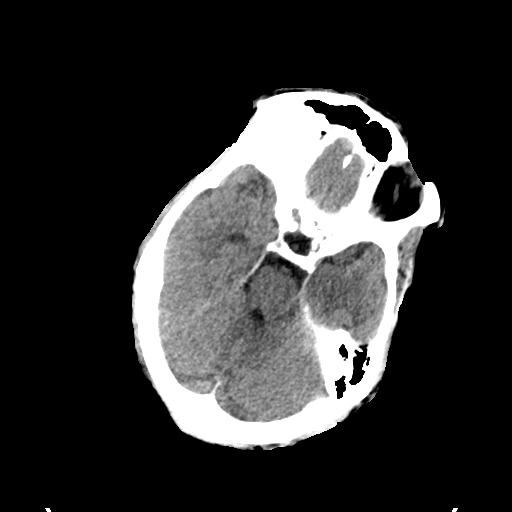
[im 13/37  bone]
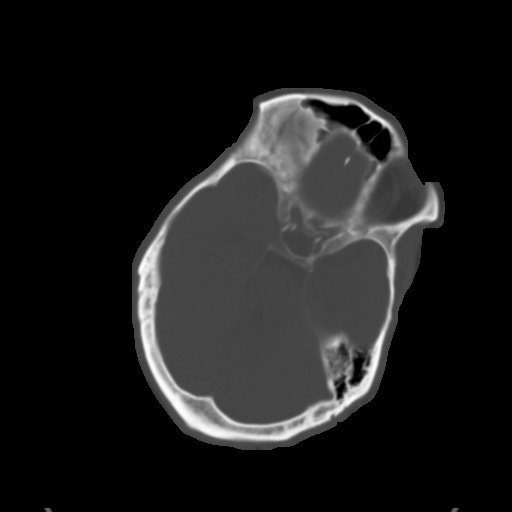
[im 25/37  brain]
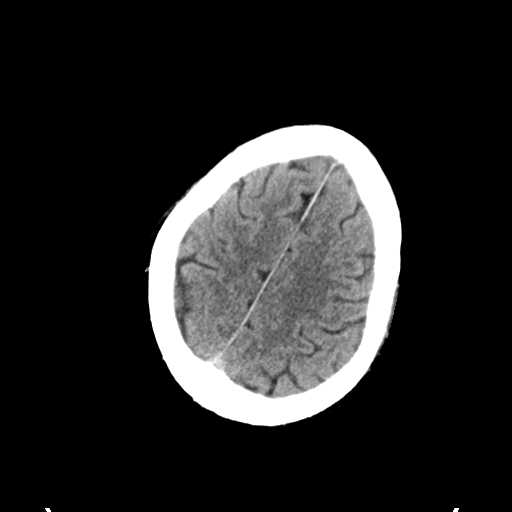

[Series 4: head bone · axial · 0.50mm/px · z∈[+660,+804]mm · 8 of 91 slices shown]
[im 10/91  bone]
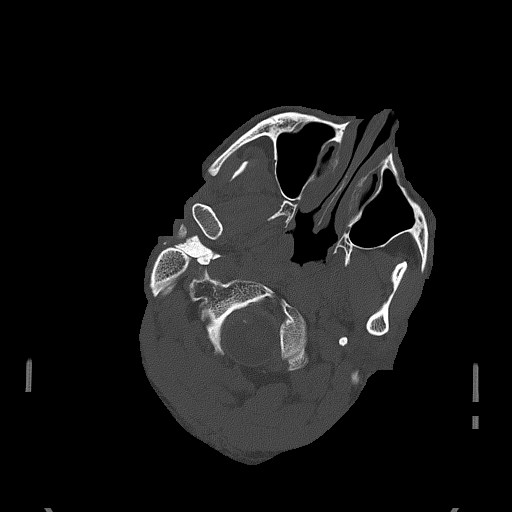
[im 19/91  bone]
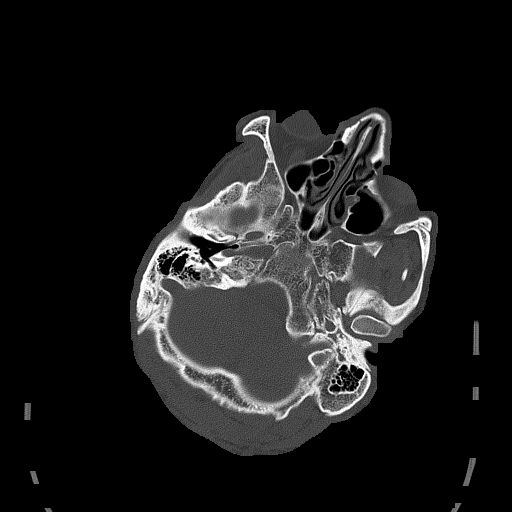
[im 28/91  bone]
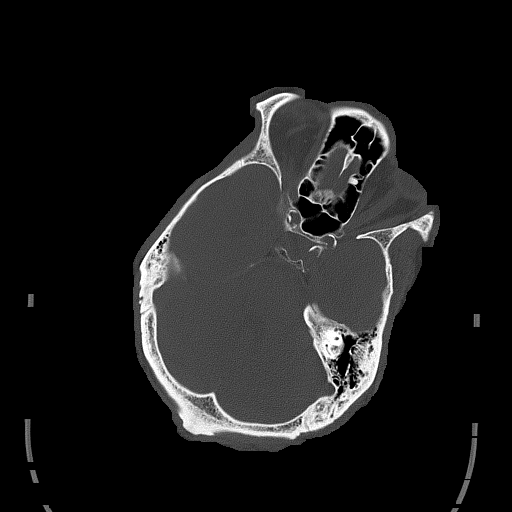
[im 37/91  bone]
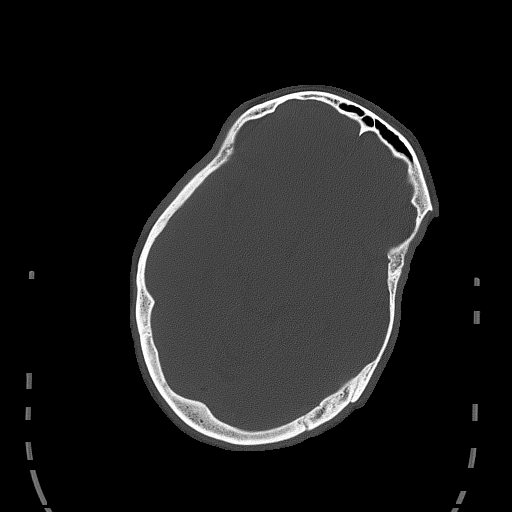
[im 55/91  bone]
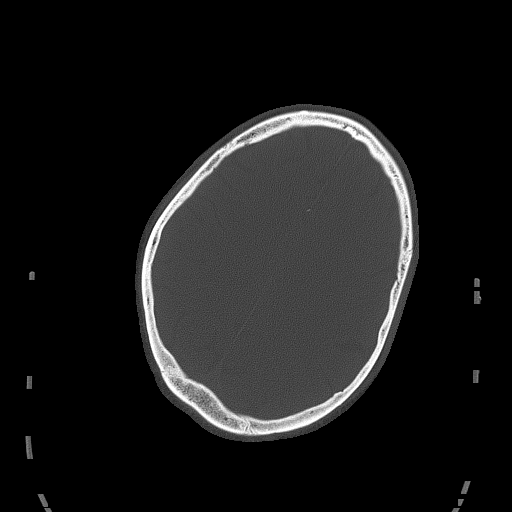
[im 64/91  bone]
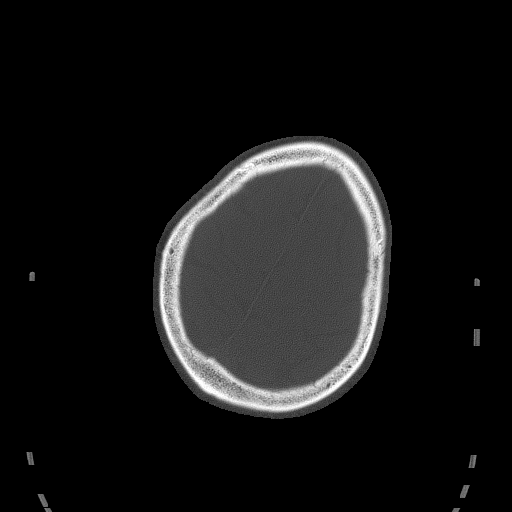
[im 73/91  bone]
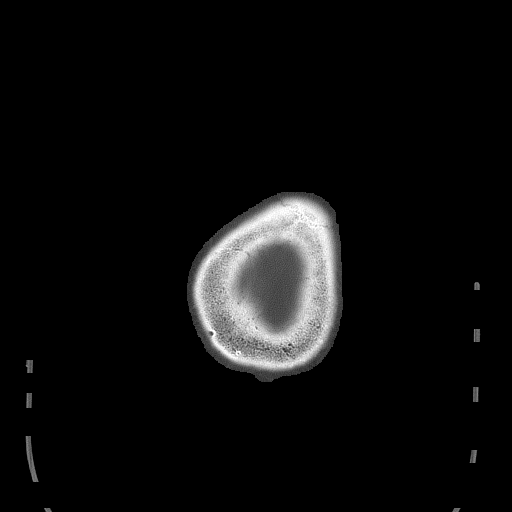
[im 82/91  bone]
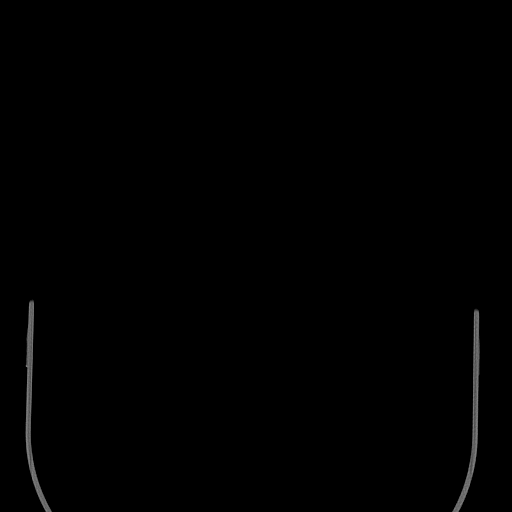

[Series 5: head without cor · coronal · non-contrast · 0.35mm/px · 3 of 79 slices shown]
[im 27/79  brain]
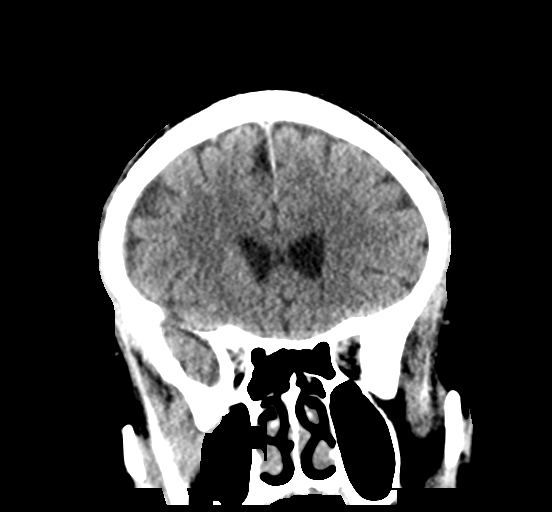
[im 35/79  brain]
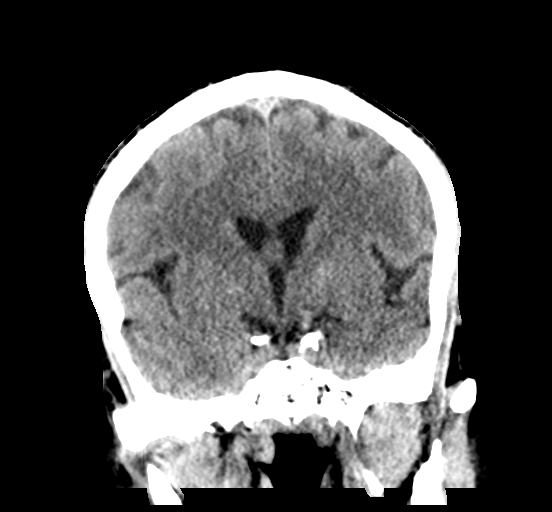
[im 44/79  brain]
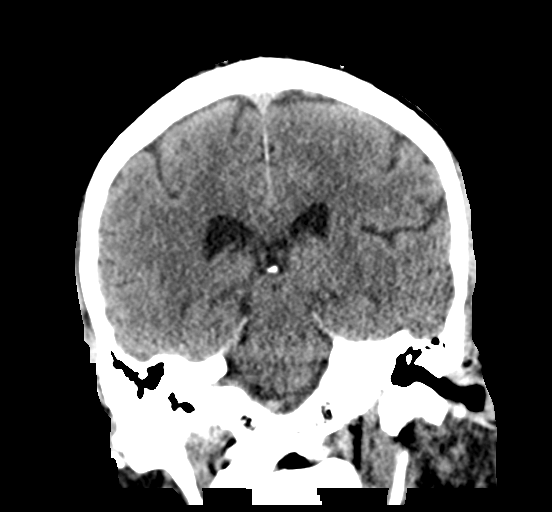

[Series 6: head without sag · sagittal · non-contrast · 0.41mm/px · 3 of 65 slices shown]
[im 22/65  brain]
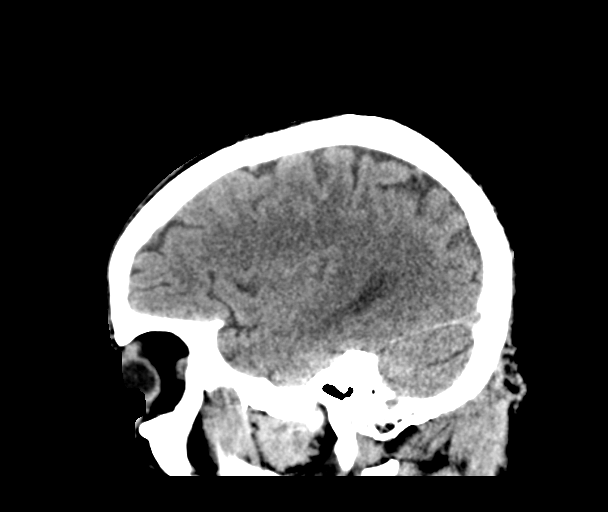
[im 33/65  brain]
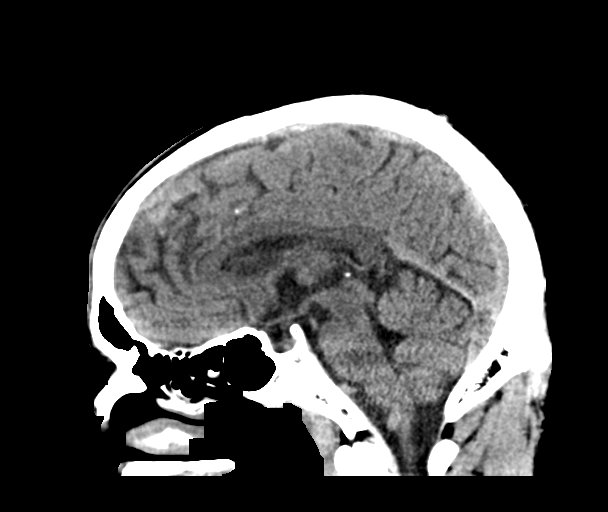
[im 43/65  brain]
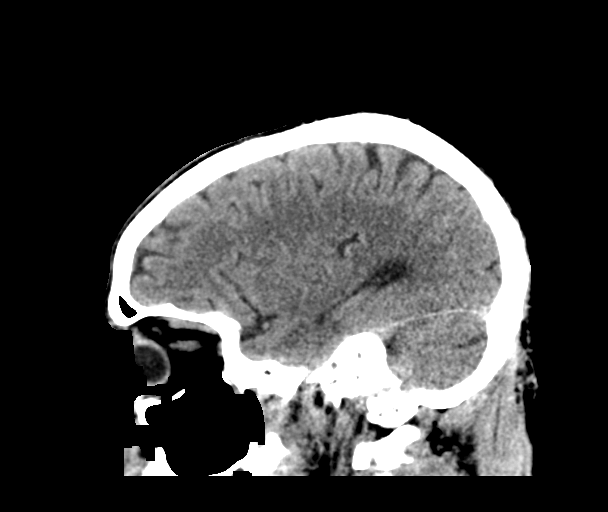

[Series 7: head without ax · axial · non-contrast · 0.50mm/px · z∈[+702,+756]mm · 2 of 35 slices shown]
[im 12/35  brain]
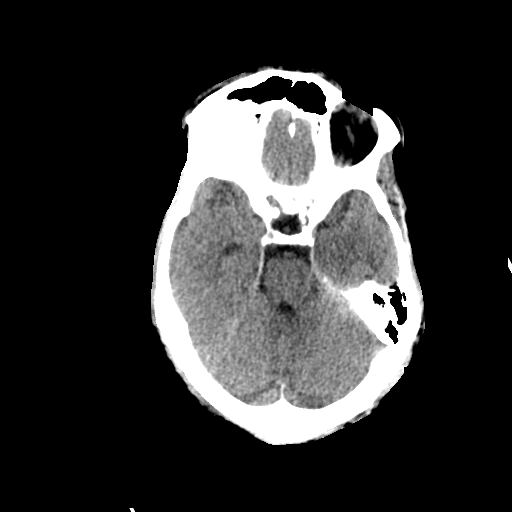
[im 23/35  brain]
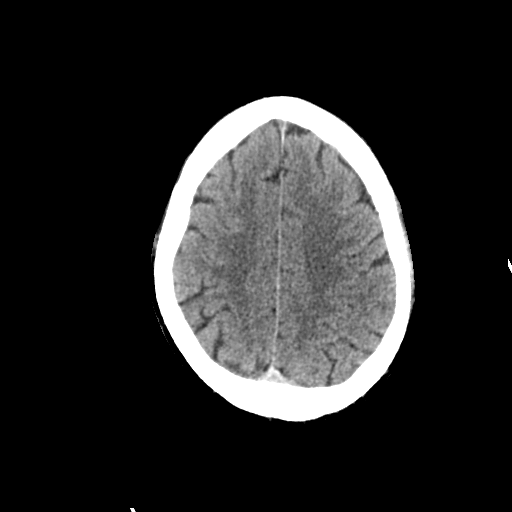

[18 of 47 positions shown; findings below may reference images not displayed]

FINDINGS: Brain: No evidence of acute infarction, hemorrhage, hydrocephalus,
extra-axial collection or mass lesion/mass effect.

Vascular: No hyperdense vessel or unexpected calcification.

Skull: Normal. Negative for fracture or focal lesion.

Sinuses/Orbits: No acute finding.

Other: None.
IMPRESSION: Normal head CT

## 2019-06-08 IMAGING — DX DG CHEST 1V PORT
1 series · 1 of 1 positions shown · non-contrast
Comparison: 11/11/2015

CLINICAL DATA: Chest pain

EXAM:
PORTABLE CHEST 1 VIEW

[chest]
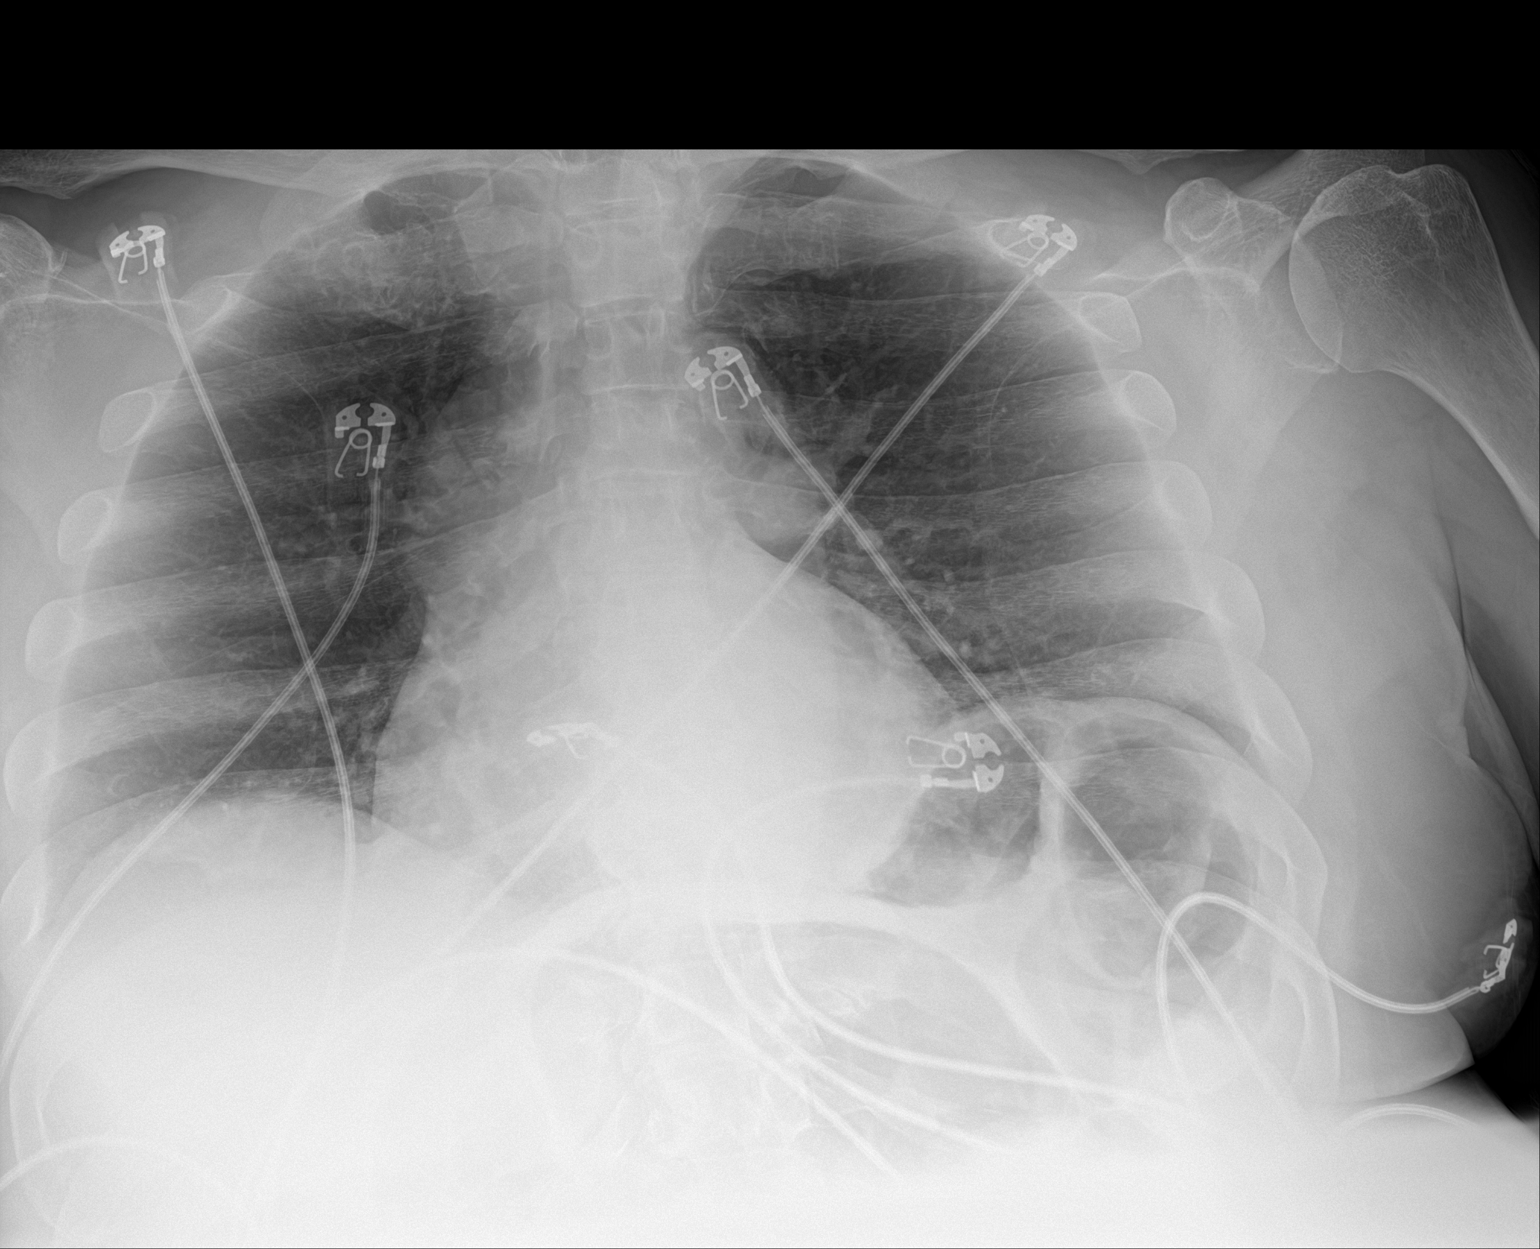

[1 of 1 positions shown; findings below may reference images not displayed]

FINDINGS: The heart size and mediastinal contours are within normal limits.
Both lungs are clear. The visualized skeletal structures are
unremarkable.
IMPRESSION: No active disease.

## 2019-06-08 IMAGING — CT CT ANGIO CHEST-ABD-PELV FOR DISSECTION W/ AND WO/W CM
2 of 7 series · 15 of 46 positions shown, 17 images · IV contrast (isovue)
Comparison: None.

CLINICAL DATA: Chest and back pain

EXAM:
CT ANGIOGRAPHY CHEST, ABDOMEN AND PELVIS
TECHNIQUE: Multidetector CT imaging through the chest, abdomen and pelvis was
performed using the standard protocol during bolus administration of
intravenous contrast. Multiplanar reconstructed images and MIPs were
obtained and reviewed to evaluate the vascular anatomy.
CONTRAST:  100 mL Isovue 370.

[Series 7: dissection 2mm · axial · 0.98mm/px · z∈[-101,+511]mm · 12 of 342 slices shown, 14 images]
[im 18/342  soft-tissue]
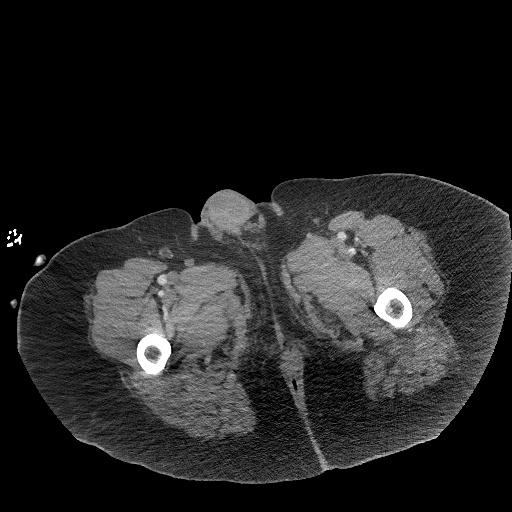
[im 18/342  bone]
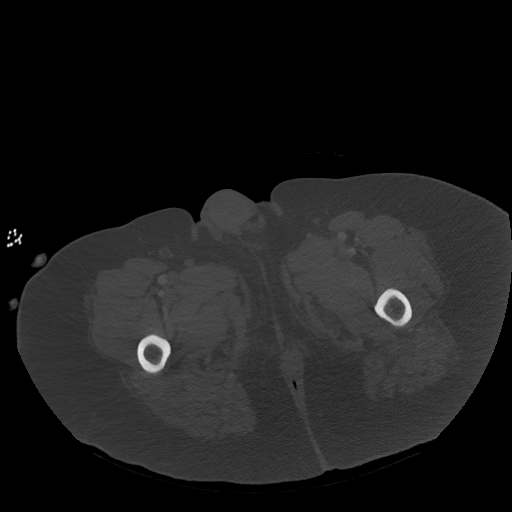
[im 54/342  soft-tissue]
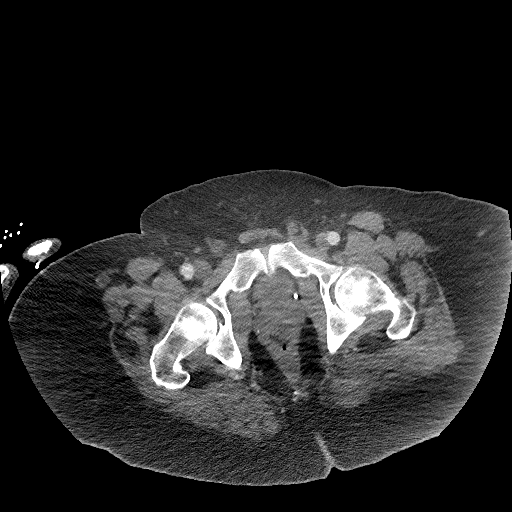
[im 72/342  soft-tissue]
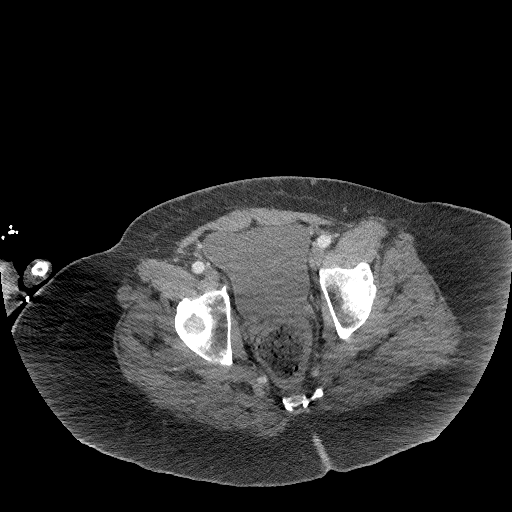
[im 108/342  soft-tissue]
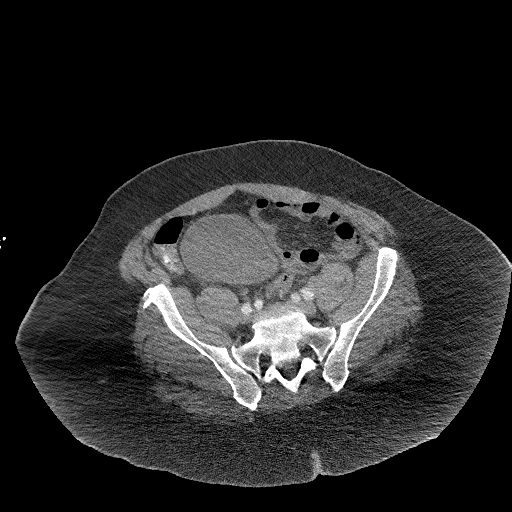
[im 126/342  soft-tissue]
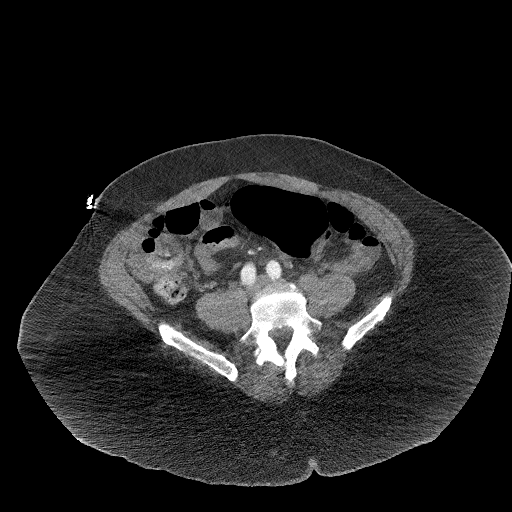
[im 162/342  soft-tissue]
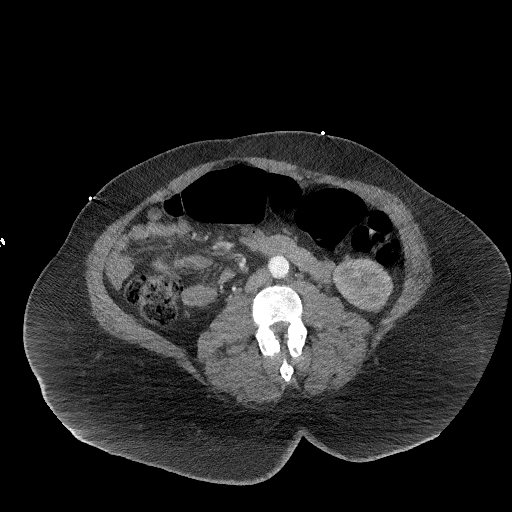
[im 180/342  soft-tissue]
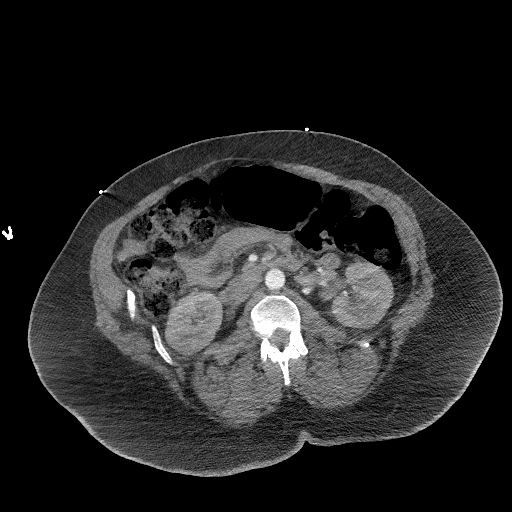
[im 216/342  soft-tissue]
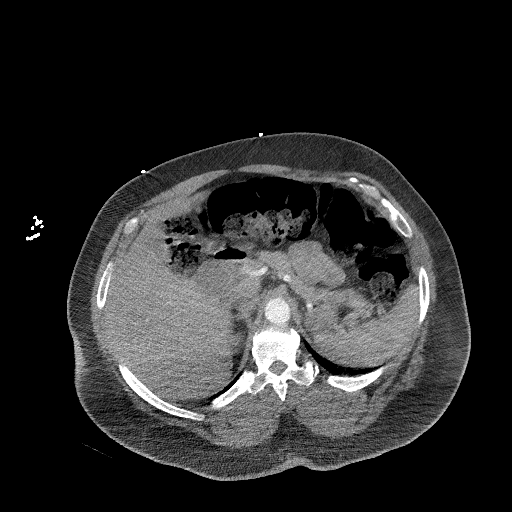
[im 234/342  soft-tissue]
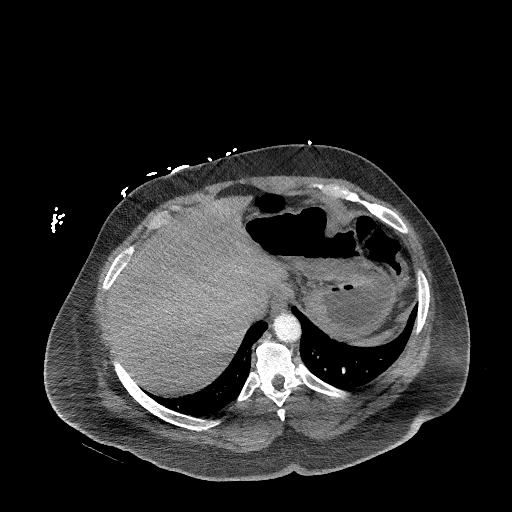
[im 234/342  bone]
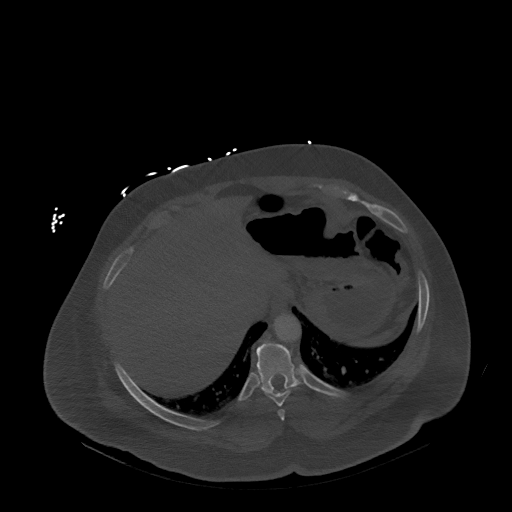
[im 270/342  soft-tissue]
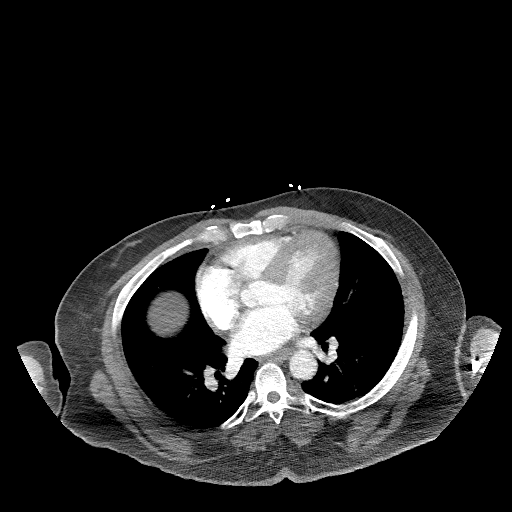
[im 288/342  soft-tissue]
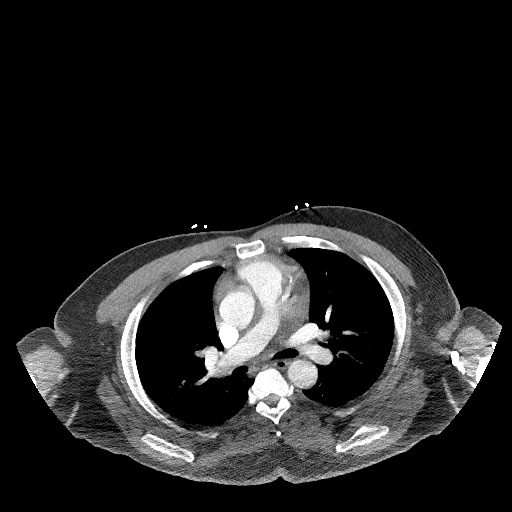
[im 324/342  soft-tissue]
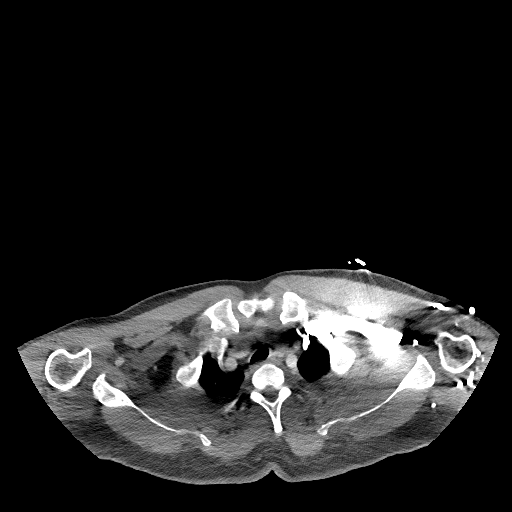

[Series 10: dissection 2mm cor · coronal · 1.33mm/px · 3 of 192 slices shown]
[im 48/192  soft-tissue]
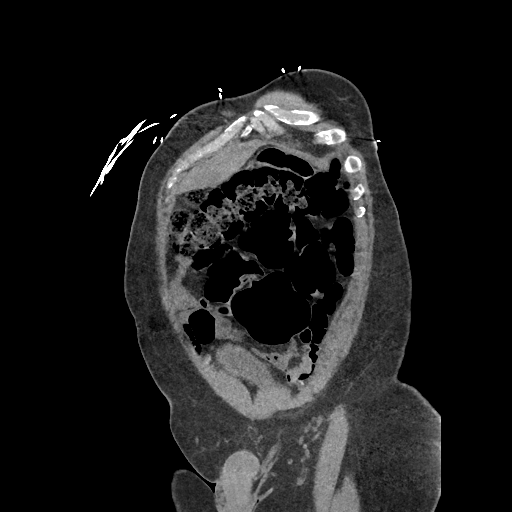
[im 96/192  soft-tissue]
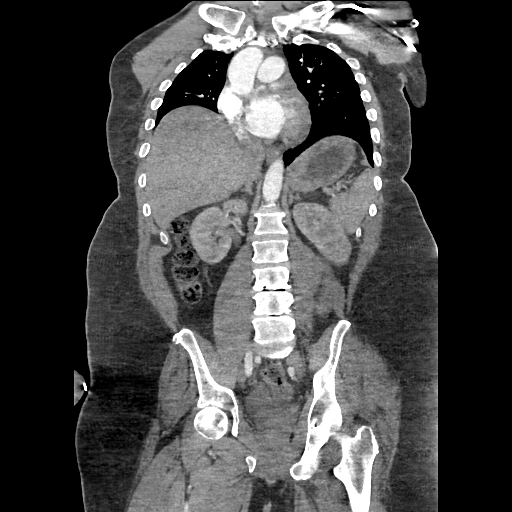
[im 144/192  soft-tissue]
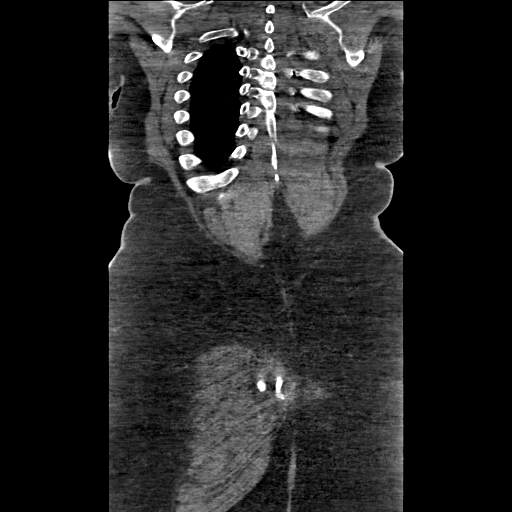

[15 of 46 positions shown; findings below may reference images not displayed]

FINDINGS: CTA CHEST FINDINGS

Cardiovascular: Thoracic aorta is well visualized with mild
calcification at the level of the aortic valve. No aneurysmal
dilatation or dissection is seen. Mild coronary calcifications are
noted. No cardiac enlargement is seen. The pulmonary artery as
visualized is within normal limits although not timed for pulmonary
embolus evaluation.

Mediastinum/Nodes: Thoracic inlet is within normal limits. No hilar
or mediastinal adenopathy is noted. The esophagus is unremarkable.

Lungs/Pleura: Lungs are well aerated bilaterally. Mild patchy
atelectatic changes are noted without focal confluent infiltrate. No
sizable effusion or pneumothorax is seen.

Musculoskeletal: No acute bony abnormality is noted. Degenerative
change of the thoracic spine is seen.

Review of the MIP images confirms the above findings.

CTA ABDOMEN AND PELVIS FINDINGS

VASCULAR

Aorta: Abdominal aorta is well visualize without evidence of
aneurysmal dilatation or dissection. No significant atherosclerotic
changes are seen.

Celiac: Patent without evidence of aneurysm, dissection, vasculitis
or significant stenosis.

SMA: Patent without evidence of aneurysm, dissection, vasculitis or
significant stenosis.

Renals: Single renal arteries are noted bilaterally. No focal
stenosis is seen.

IMA: Patent without evidence of aneurysm, dissection, vasculitis or
significant stenosis.

Iliacs: Within normal limits.

Veins: No venous abnormality is seen.

Review of the MIP images confirms the above findings.

NON-VASCULAR

Hepatobiliary: Mild fatty infiltration of the liver is noted. The
gallbladder is well distended with gallstones within. No
pericholecystic fluid or inflammatory changes are seen.

Pancreas: Unremarkable. No pancreatic ductal dilatation or
surrounding inflammatory changes.

Spleen: Normal in size without focal abnormality.

Adrenals/Urinary Tract: The adrenal glands are within normal limits.
Kidneys are well visualized bilaterally. No renal calculi or
obstructive changes are seen. The bladder is well distended.

Stomach/Bowel: No obstructive or inflammatory changes are
identified. The appendix is not well visualized. No inflammatory
changes to suggest appendicitis are noted.

Lymphatic: No significant lymphadenopathy is seen.

Reproductive: Prostate is unremarkable.

Other: No abdominal wall hernia or abnormality. No abdominopelvic
ascites.

Musculoskeletal: No acute or significant osseous findings.

Review of the MIP images confirms the above findings.
IMPRESSION: No evidence of aneurysmal dilatation or dissection.

No sizable pulmonary emboli are seen.

Mild patchy atelectatic changes are noted.

Cholelithiasis without complicating factors.

## 2019-06-09 IMAGING — DX DG CHEST 2V
2 series · 2 of 2 positions shown · non-contrast
Comparison: 09/28/2017

CLINICAL DATA: Sharp central chest pain

EXAM:
CHEST - 2 VIEW

[x chest ap]
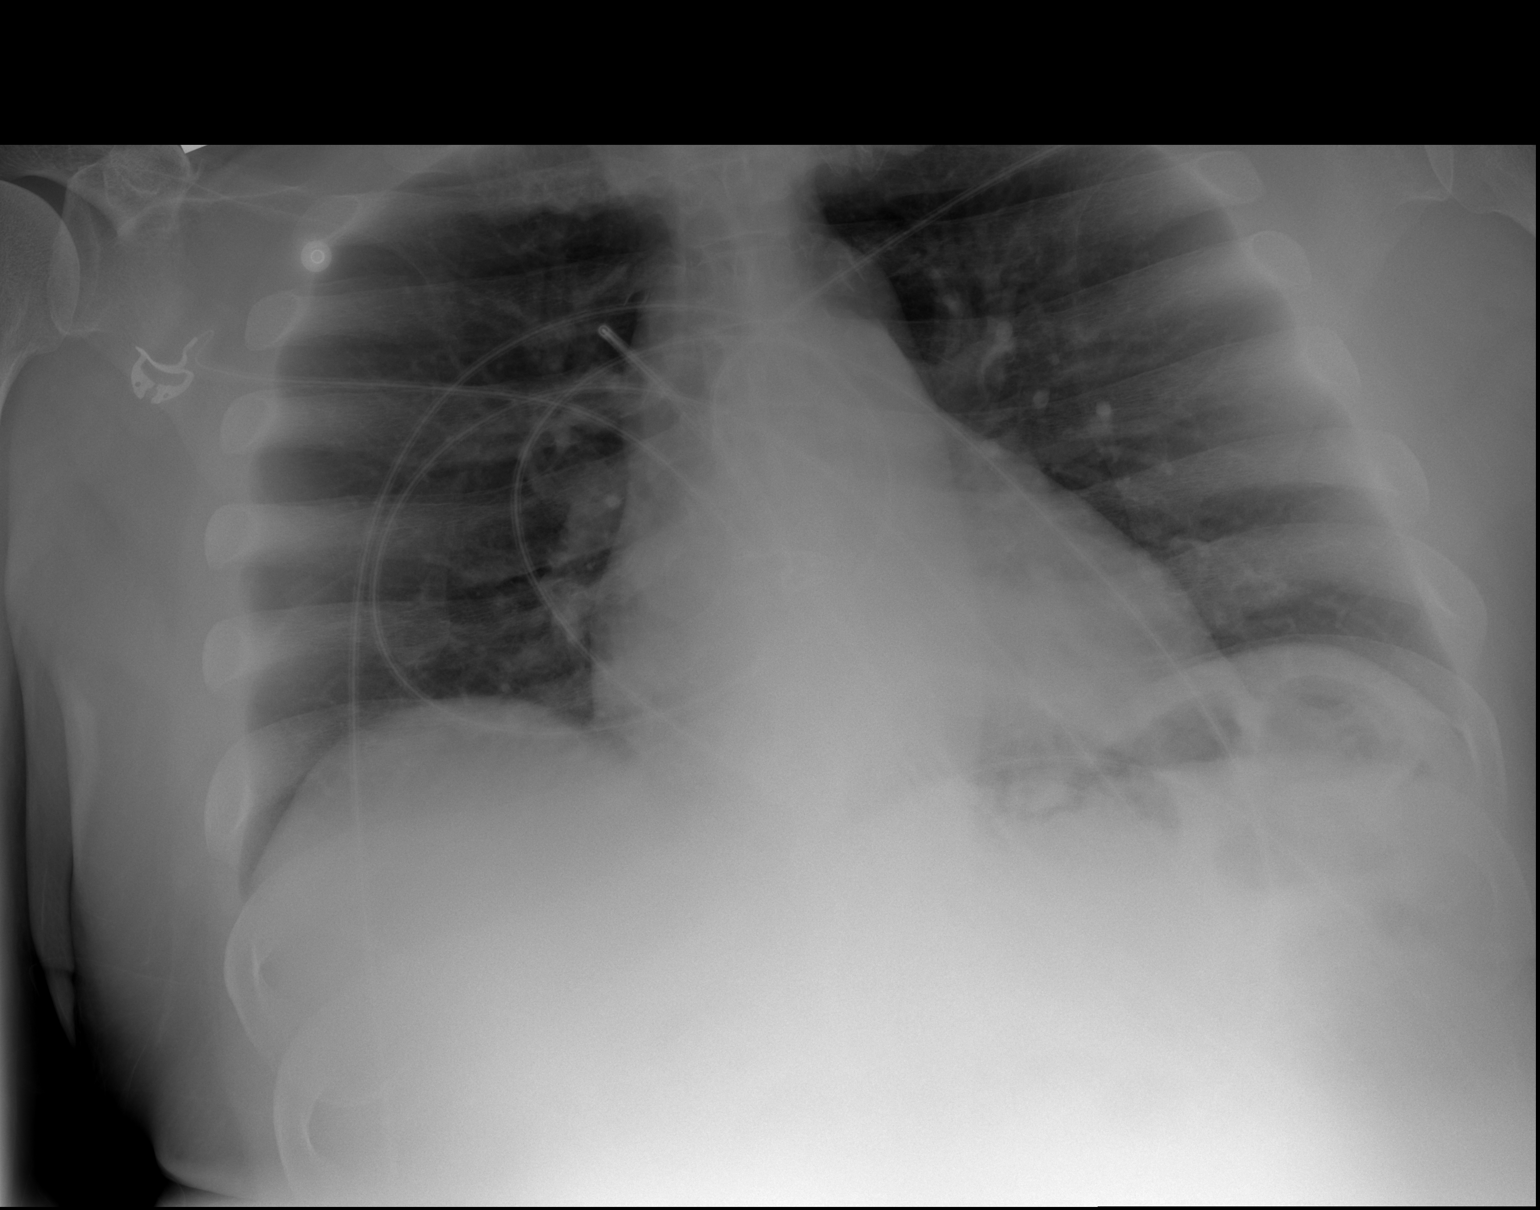

[w chest lat]
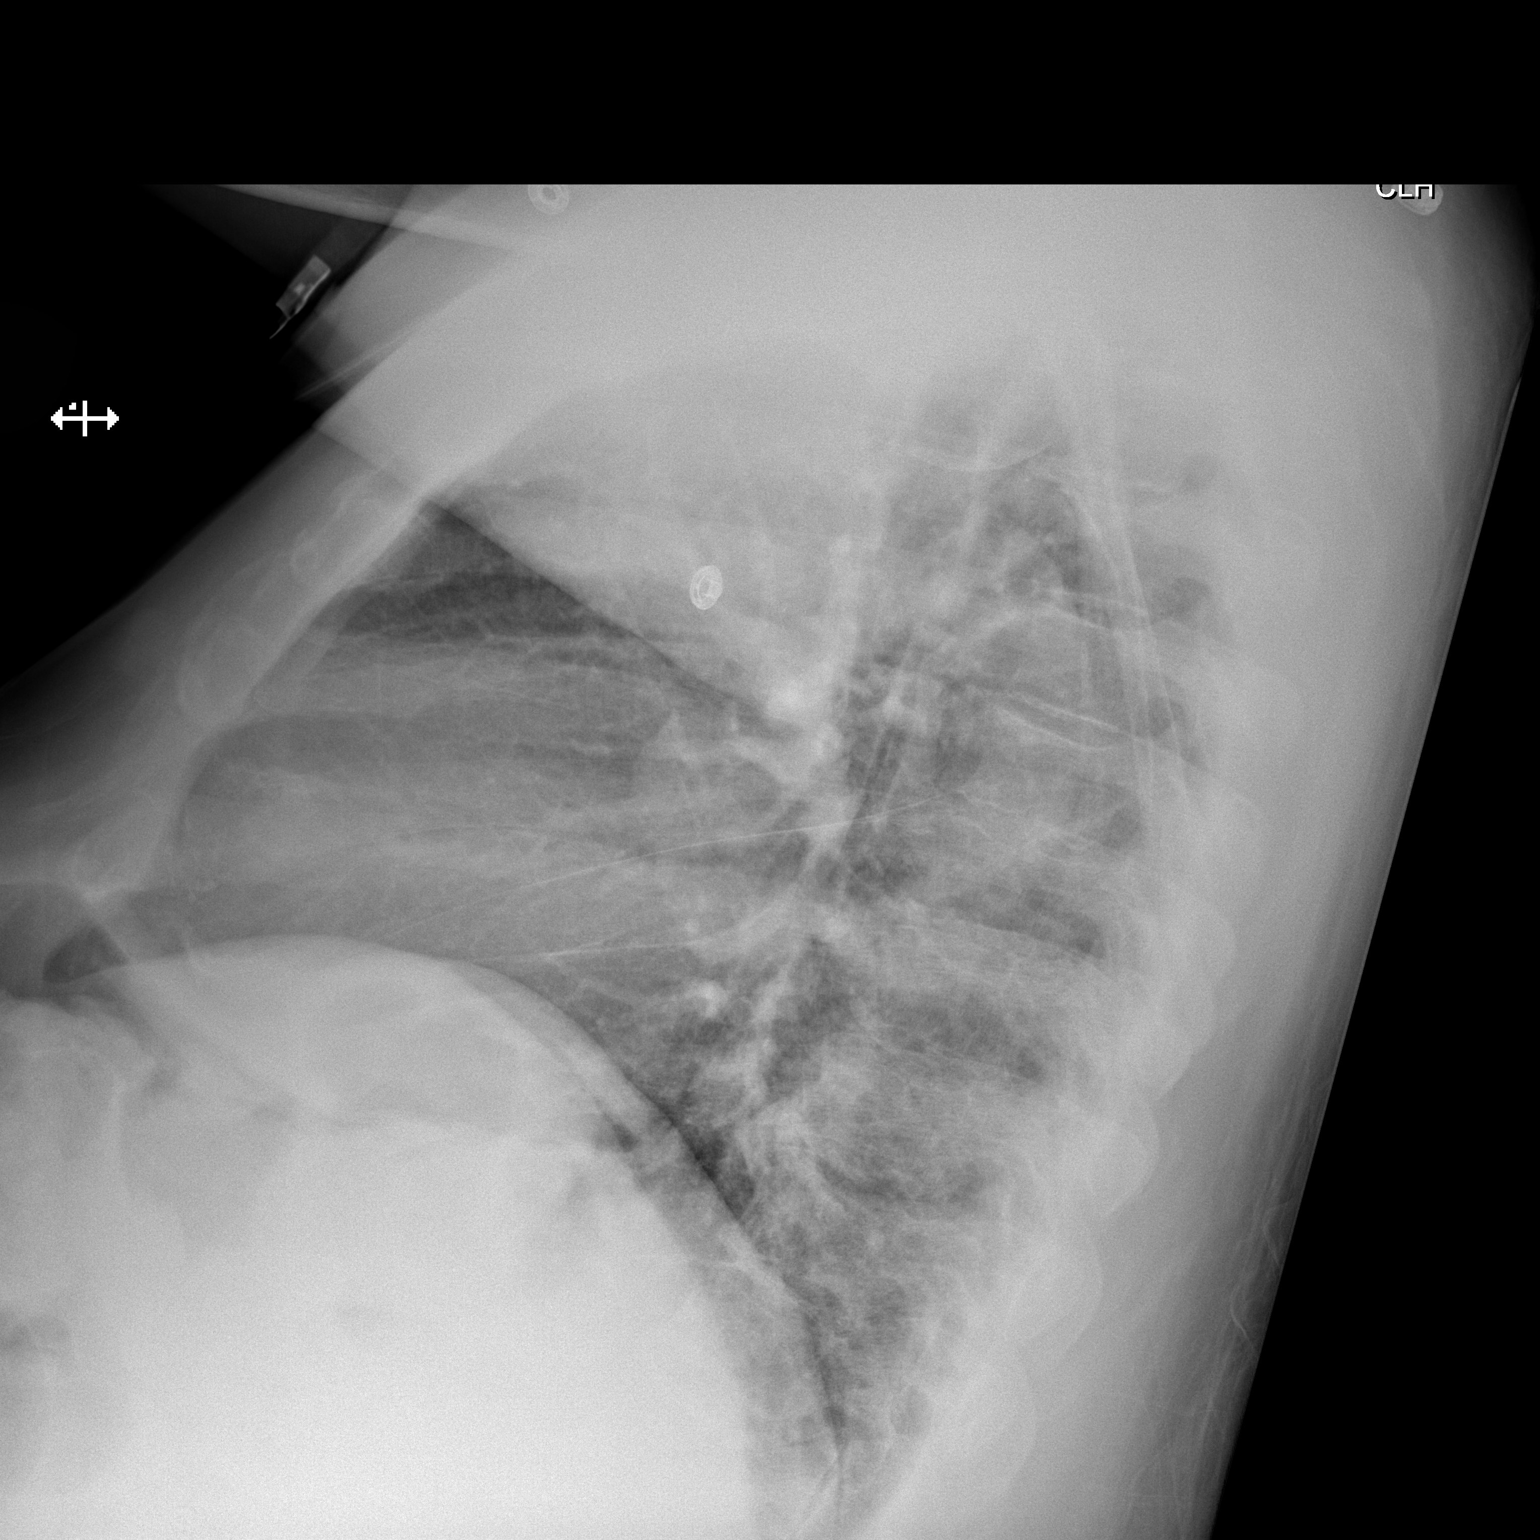

[2 of 2 positions shown; findings below may reference images not displayed]

FINDINGS: Normal heart size. Lungs under aerated and grossly clear. No
pneumothorax. No pleural effusion.
IMPRESSION: No active cardiopulmonary disease.

## 2019-09-10 IMAGING — CR DG CHEST 2V
2 series · 2 of 2 positions shown · non-contrast
Comparison: 09/29/2017.

CLINICAL DATA: Chronic shortness of breath.

EXAM:
CHEST - 2 VIEW

[chest lat]
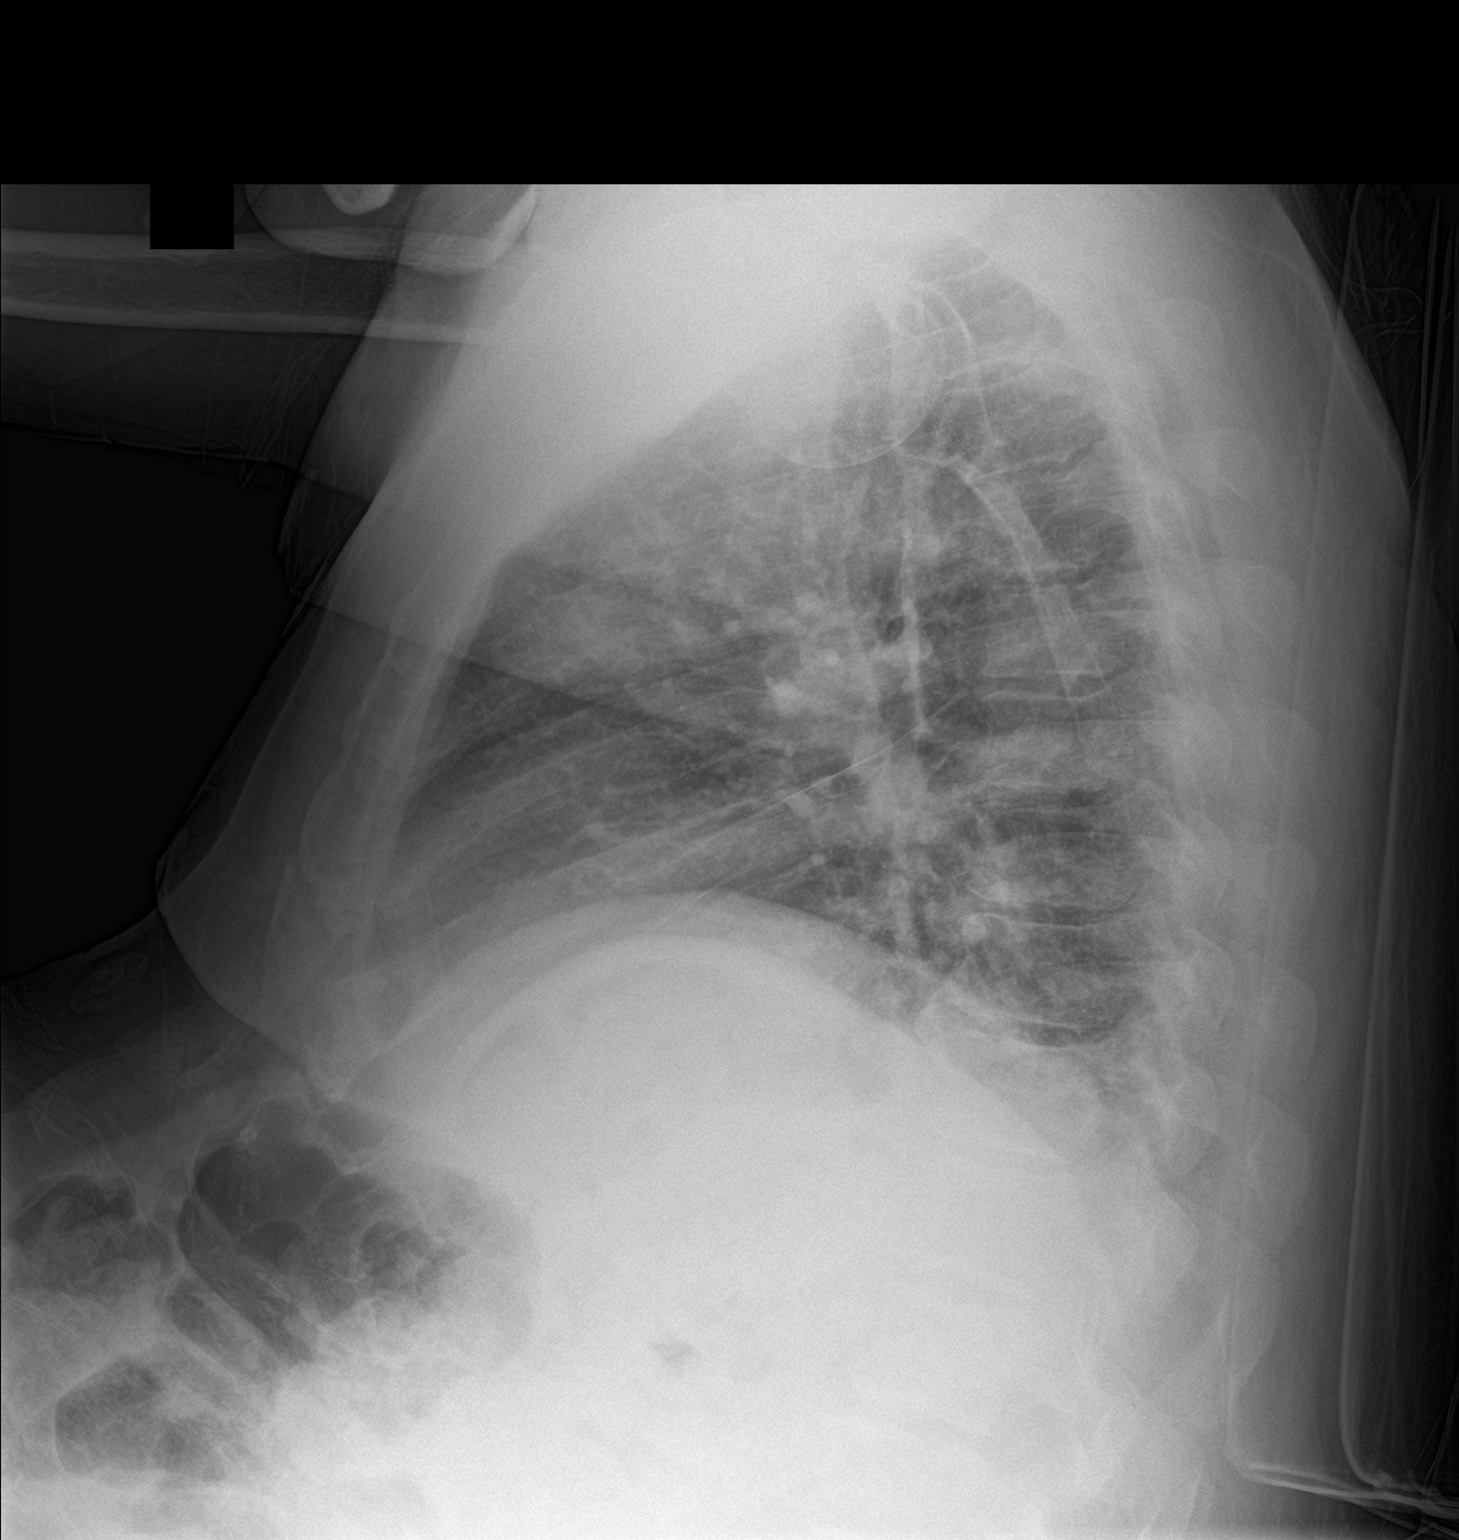

[chest ap]
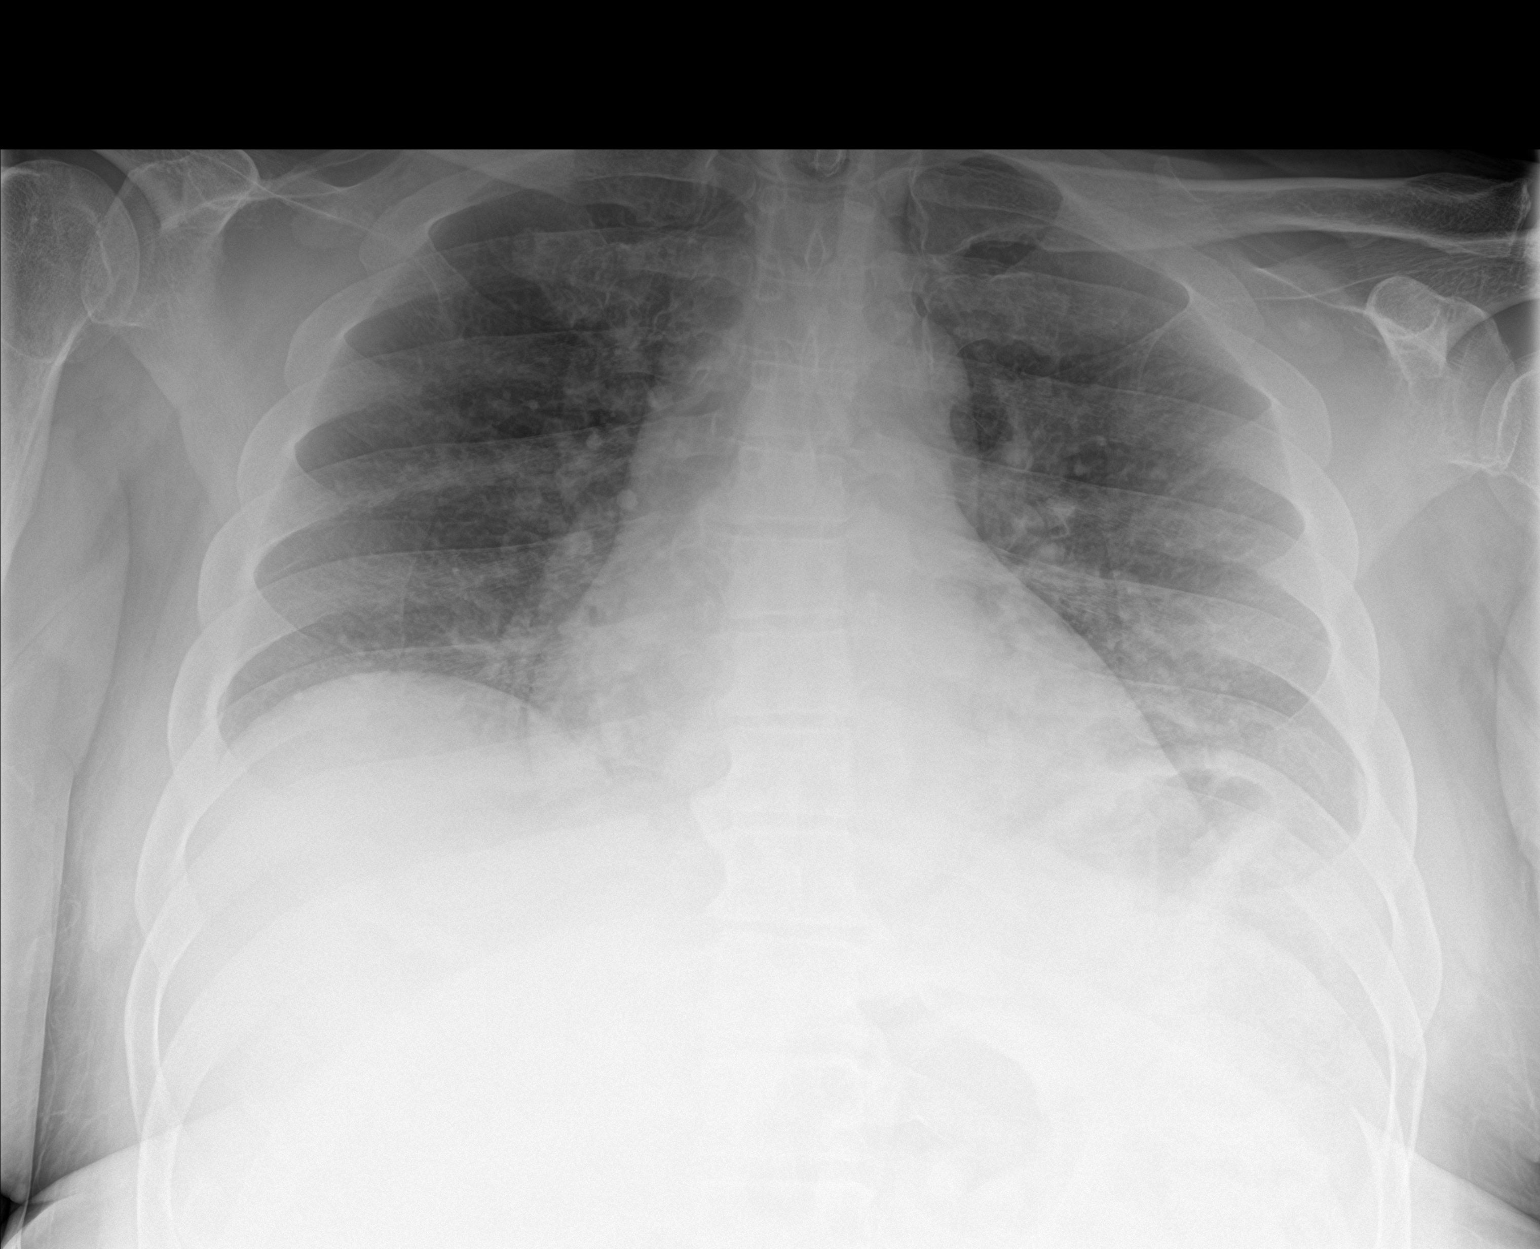

[2 of 2 positions shown; findings below may reference images not displayed]

FINDINGS: Cardiomegaly with mild bilateral interstitial prominence. Findings
suggest mild CHF. Mild basilar atelectasis. No acute bony
abnormality.
IMPRESSION: Cardiomegaly with mild bilateral interstitial prominence consistent
with mild CHF.

## 2019-12-12 ENCOUNTER — Emergency Department (HOSPITAL_COMMUNITY)
Admission: EM | Admit: 2019-12-12 | Discharge: 2019-12-13 | Disposition: A | Discharge status: Home / Self Care | Payer: Medicare HMO | Attending: Emergency Medicine | Admitting: Emergency Medicine

## 2019-12-12 ENCOUNTER — Emergency Department (HOSPITAL_COMMUNITY): Payer: Medicare HMO

## 2019-12-12 ENCOUNTER — Other Ambulatory Visit: Payer: Self-pay

## 2019-12-12 ENCOUNTER — Encounter (HOSPITAL_COMMUNITY): Payer: Self-pay | Admitting: Emergency Medicine

## 2019-12-12 DIAGNOSIS — R079 Chest pain, unspecified: Secondary | ICD-10-CM | POA: Insufficient documentation

## 2019-12-12 DIAGNOSIS — Z20822 Contact with and (suspected) exposure to covid-19: Secondary | ICD-10-CM | POA: Insufficient documentation

## 2019-12-12 DIAGNOSIS — Z79899 Other long term (current) drug therapy: Secondary | ICD-10-CM | POA: Insufficient documentation

## 2019-12-12 DIAGNOSIS — I5033 Acute on chronic diastolic (congestive) heart failure: Secondary | ICD-10-CM | POA: Diagnosis not present

## 2019-12-12 DIAGNOSIS — E1140 Type 2 diabetes mellitus with diabetic neuropathy, unspecified: Secondary | ICD-10-CM | POA: Insufficient documentation

## 2019-12-12 DIAGNOSIS — I110 Hypertensive heart disease with heart failure: Secondary | ICD-10-CM | POA: Diagnosis not present

## 2019-12-12 DIAGNOSIS — Z7982 Long term (current) use of aspirin: Secondary | ICD-10-CM | POA: Insufficient documentation

## 2019-12-12 DIAGNOSIS — Z87891 Personal history of nicotine dependence: Secondary | ICD-10-CM | POA: Diagnosis not present

## 2019-12-12 LAB — CBC
HCT: 38.4 % — ABNORMAL LOW (ref 39.0–52.0)
Hemoglobin: 11.9 g/dL — ABNORMAL LOW (ref 13.0–17.0)
MCH: 25 pg — ABNORMAL LOW (ref 26.0–34.0)
MCHC: 31 g/dL (ref 30.0–36.0)
MCV: 80.7 fL (ref 80.0–100.0)
Platelets: 205 10*3/uL (ref 150–400)
RBC: 4.76 MIL/uL (ref 4.22–5.81)
RDW: 14 % (ref 11.5–15.5)
WBC: 4.4 10*3/uL (ref 4.0–10.5)
nRBC: 0 % (ref 0.0–0.2)

## 2019-12-12 LAB — BASIC METABOLIC PANEL
Anion gap: 10 (ref 5–15)
BUN: 12 mg/dL (ref 6–20)
CO2: 23 mmol/L (ref 22–32)
Calcium: 8.9 mg/dL (ref 8.9–10.3)
Chloride: 107 mmol/L (ref 98–111)
Creatinine, Ser: 1.01 mg/dL (ref 0.61–1.24)
GFR, Estimated: >60 mL/min (ref >60)
Glucose, Bld: 229 mg/dL — ABNORMAL HIGH (ref 70–99)
Potassium: 3.2 mmol/L — ABNORMAL LOW (ref 3.5–5.1)
Sodium: 140 mmol/L (ref 135–145)

## 2019-12-12 LAB — TROPONIN I (HIGH SENSITIVITY)
Troponin I (High Sensitivity): 31 ng/L — ABNORMAL HIGH (ref <18)
Troponin I (High Sensitivity): 33 ng/L — ABNORMAL HIGH (ref <18)

## 2019-12-12 LAB — RESPIRATORY PANEL BY RT PCR (FLU A&B, COVID)
Influenza A by PCR: NEGATIVE
Influenza B by PCR: NEGATIVE
SARS Coronavirus 2 by RT PCR: NEGATIVE

## 2019-12-12 LAB — RAPID URINE DRUG SCREEN, HOSP PERFORMED
Amphetamines: NOT DETECTED
Barbiturates: NOT DETECTED
Benzodiazepines: NOT DETECTED
Cocaine: NOT DETECTED
Opiates: NOT DETECTED
Tetrahydrocannabinol: NOT DETECTED

## 2019-12-12 MED ORDER — IOHEXOL 350 MG/ML SOLN
75.0000 mL | Freq: Once | INTRAVENOUS | Status: AC | PRN
  Administered 2019-12-12: 75 mL via INTRAVENOUS

## 2019-12-12 MED ORDER — LORAZEPAM 2 MG/ML IJ SOLN
1.0000 mg | Freq: Once | INTRAMUSCULAR | Status: AC
  Administered 2019-12-12: 1 mg via INTRAVENOUS
  Filled 2019-12-12: qty 1

## 2019-12-12 MED ORDER — AMLODIPINE BESYLATE 5 MG PO TABS
5.0000 mg | ORAL_TABLET | Freq: Once | ORAL | Status: AC
  Administered 2019-12-12: 5 mg via ORAL
  Filled 2019-12-12: qty 1

## 2019-12-12 MED ORDER — CLONIDINE HCL 0.2 MG PO TABS
0.2000 mg | ORAL_TABLET | Freq: Once | ORAL | Status: AC
  Administered 2019-12-12: 0.2 mg via ORAL
  Filled 2019-12-12: qty 1

## 2019-12-12 NOTE — ED Notes (Signed)
Received care from [place], [place]. She reports patient refused CT angio chest

## 2019-12-12 NOTE — ED Notes (Signed)
Spoke with CT regarding patient, they are to come transport patient to CT to attempt scan.

## 2019-12-12 NOTE — ED Notes (Signed)
Patient back from CT, CT performed

## 2019-12-12 NOTE — ED Notes (Signed)
Patient resting peacefully on stretcher, respirations even and nonlabored, vitals stable. Patient remains drowsy from Ativan. Arousable to touch, but quickly falls back asleep.

## 2019-12-12 NOTE — ED Notes (Signed)
RN reinforced importance of getting CT angio, pt verbalized understanding but remains anxious regarding test. Patient agrees to try after Ativan admin.

## 2019-12-12 NOTE — ED Triage Notes (Signed)
Pt arrives to ED via [facility] with c/o of chest pain. Pain started suddenly today , left sided. BP on ems arrival 210/110 1 sl nitro given & 324mg  ASA. BP down to 984 systolic.

## 2019-12-12 NOTE — Discharge Instructions (Addendum)
We saw you in the ER for the chest pain/shortness of breath, weakness. All of our cardiac workup is normal, including labs, EKG and chest X-RAY are normal. CT scan is neg We are not sure what is causing your discomfort, but we feel comfortable sending you home at this time. The workup in the ER is not complete, and you should follow up with your primary care doctor for further evaluation.  As discussed - call your primary care doctor for further assistance in the area of rehab options as you have declined our social work involvement for now.

## 2019-12-12 NOTE — ED Provider Notes (Signed)
[facility] EMERGENCY DEPARTMENT Provider Note   CSN: [id number] Arrival date & time: 12/12/19  1324     History Chief Complaint  Patient presents with  . Chest Pain    [name] is a 59 y.o. male.  HPI   59 year old male history of coronary artery disease, hypertension, type 2 diabetes, bipolar disorder, cocaine abuse, recent stroke presents today complaining of left-sided chest pain.  Patient states the pain began this morning while he was on the couch.  He states it is sharp and left-sided.  He has had ongoing dyspnea that preceded the chest pain.  Pain is continuous and moderate to severe.  He is unable to tell me if this is similar to previous chest pain.  He does report that he has had multiple problems with his heart he did not get any relief with nitroglycerin which she was given here.  Patient reports that he was just discharged from rehab after a stroke yesterday.  He is followed in the [facility] system.  I have reviewed his last discharge from [facility] when he was noted to have a PCA stroke.  Patient states he came here after his discharge to visit his father who wanted to see him after his stroke.  He denies recent substance abuse.  He denies cough, fever, history of Covid, or Covid infection.  He has received 1 dose of his Covid vaccine and states he is eligible for the second dose on October 21.  Past Medical History:  Diagnosis Date  . Arthritis   . Bipolar affective ([place])   . CHF (congestive heart failure) ([place])   . Depression   . Diabetes mellitus    uncontrolled   . Hypertension    Result Date: 06/30/2019  Subtotal occlusion of a very small caliber obtuse marginal 1 branch  Otherwise mild diffuse coronary irregularity.  Preserved left ventricular size and systolic function left ventricular ejection fraction 65%. ---------------------- Consent: Verbal consent obtained. Written consent obtained. Risks and benefits: risks, benefits and alternatives  were discussed. Risks including but not limited to myocardial infarction, stroke, bleeding, need for blood transfusion, death, disability, cardiac arrhythmias, need for emergent bypass surgery, renal failure, need for dialysis, allergic reaction to contrast. Consent given by: patient Patient understanding: patient states understanding of the procedure being performed Patient consent: the patient's understanding of the procedure matches consent given Procedure consent: procedure consent matches procedure scheduled Relevant documents: relevant documents present and verified Test results: test results available and properly labeled Required items: required blood products, implants, devices, and special equipment available Patient identity confirmed: verbally with patient, arm band, Under my direct supervision, medications as listed in the procedure log were administered intravenously by the independent sedation trained nurse for moderate sedation. Pulse oximetry, heart rate, and BP were continuously monitored by an independent trained observer present.( Please refer to the final cath report for the dosages of IV medications used) I spent 25 Mins of face to face sedation time with the patient. PROCEDURES PERFORMED: 1.  Left heart catheterization. 2. Left ventriculogram. 3. Selective coronary angiography. 4. O2 saturation monitoring. 5. IV sedation. DESCRIPTION OF PROCEDURE: After informed consent was obtained, the patient was taken to the catheterization lab where he was sedated with IV Versed and fentanyl. O2 saturation and hemodynamics were monitored throughout the procedure. The patient was sterilized and draped in the usual manner. Access was obtained via the right radial artery using a 6 French sheath. Selective coronary angiography was performed using Tiger 5  [place] diagnostic catheters and left ventriculogram using a 5 French angled pigtail catheter. Findings; Left main is patent. LAD has mild diffuse  irregularity. Diagonal and septal branches are patent. The left circumflex is a dominant vessel and has mild diffuse irregularity. Obtuse marginal 1 is a small caliber branch and has subtotal occlusion. Second obtuse marginal and PLV branches are patent. Right coronary artery is small and nondominant. It is patent. Left milligrams performed revealed preserved LV systolic function left-ventricular ejection fraction 65%. Plan; Medical therapy for very small caliber subtotal occlusion of OM1 branch. Aspirin, statin and ACE inhibitor's. Beta-blockers not used due to history of cocaine use. Aggressive control of his blood pressure and diabetes. Rehab for cocaine use.   Patient Active Problem List   Diagnosis Date Noted  . Acute on chronic diastolic CHF (congestive heart failure) ([place]) 12/31/2017  . Poorly controlled diabetes mellitus ([place])   . Pure hypercholesterolemia   . Hyperglycemia 09/28/2017  . Nonobstructive atherosclerosis of coronary artery 09/28/2017  . Chronic HFrEF (heart failure with reduced ejection fraction) ([place]) 09/28/2017  . MDD (major depressive disorder), recurrent episode, severe ([place]) 07/30/2017  . Osteoarthritis 04/12/2015  . Severe recurrent major depression with psychotic features ([place]) 01/04/2015  . Substance-related disorder ([place]) 10/30/2014  . Patient's noncompliance with other medical treatment and regimen 06/07/2014  . Cocaine dependence, uncomplicated ([place]) 40/98/1191  . Chronic pain 05/31/2014  . Cocaine abuse ([place]) 05/31/2014  . History of noncompliance with medical treatment 05/31/2014  . Malingering 05/31/2014  . Pancytopenia ([place]) 05/31/2014  . Diabetic neuropathy associated with diabetes mellitus due to underlying condition ([place]) 05/31/2014  . Diabetic neuropathy ([place]) 05/31/2014  . Congestive heart failure with left ventricular systolic dysfunction ([place]) 02/22/2014  . Depression, major, recurrent, severe with psychosis ([place]) 08/29/2013  . Suicidal ideations  08/28/2013  . Uncontrolled diabetes mellitus ([place]) 06/30/2013  . Atypical chest pain 12/09/2011  . HTN (hypertension), benign 12/09/2011  . Episodic mood disorder ([place]) 05/13/2011    Past Surgical History:  Procedure Laterality Date  . KNEE SURGERY     b/l knees; pending total knee        Family History  Problem Relation Age of Onset  . Diabetes Other   . Cancer Mother        uterus; deceased   . Heart attack Mother 61  . Hypertension Other   . Heart attack Father 39  . Stroke Sister        12/2012 age 62 y.o    Social History   Tobacco Use  . Smoking status: Former Research scientist (life sciences)  . Smokeless tobacco: Never Used  Vaping Use  . Vaping Use: Never used  Substance Use Topics  . Alcohol use: No  . Drug use: Yes    Types: Cocaine    Home Medications Prior to Admission medications   Medication Sig Start Date End Date Taking? Authorizing Provider  amLODipine (NORVASC) 10 MG tablet Take 10 mg by mouth daily.  12/08/17  Yes [provider]  benazepril (LOTENSIN) 10 MG tablet Take 1 tablet (10 mg total) by mouth daily. 10/02/17  Yes Seawell, Jaimie A, DO  furosemide (LASIX) 40 MG tablet Take 1.5 tablets (60 mg total) by mouth 2 (two) times daily. Patient taking differently: Take 40 mg by mouth 2 (two) times daily.  01/02/18 12/12/19 Yes Aline August, MD  gabapentin (NEURONTIN) 400 MG capsule Take 400 mg by mouth 2 (two) times daily.  01/24/18  Yes [provider]  amLODipine (NORVASC) 5 MG tablet Take  1 tablet (5 mg total) by mouth daily. Patient not taking: Reported on 01/29/2018 10/01/17   Molli Hazard A, DO  aspirin EC 81 MG EC tablet Take 1 tablet (81 mg total) by mouth daily. Patient not taking: Reported on 12/31/2017 10/02/17   Molli Hazard A, DO  atorvastatin (LIPITOR) 40 MG tablet Take 0.5 tablets (20 mg total) by mouth daily at 6 PM. Patient not taking: Reported on 12/31/2017 10/01/17   Seawell, Jaimie A, DO  buPROPion (WELLBUTRIN XL) 300 MG 24 hr tablet  Take 300 mg by mouth daily.    [provider]  LEVEMIR FLEXTOUCH 100 UNIT/ML Pen Inject into the skin at bedtime.  12/15/17   [provider]  methocarbamol (ROBAXIN) 500 MG tablet Take 500 mg by mouth every 8 (eight) hours as needed for muscle spasms.  01/24/18   [provider]  omeprazole (PRILOSEC) 20 MG capsule Take 1 capsule (20 mg total) by mouth daily. 01/29/18   Montine Circle, PA-C    Allergies    Other  Review of Systems   Review of Systems  All other systems reviewed and are negative.   Physical Exam Updated Vital Signs BP (!) 186/136 (BP Location: Right Arm)   Pulse 99   Temp 97.8 F (36.6 C) (Oral)   Resp 16   SpO2 100%   Physical Exam Vitals reviewed.  Constitutional:      General: He is not in acute distress.    Appearance: He is well-developed. He is obese.  HENT:     Head: Normocephalic.  Eyes:     Pupils: Pupils are equal, round, and reactive to light.  Cardiovascular:     Rate and Rhythm: Normal rate and regular rhythm.     Heart sounds: Normal heart sounds.  Pulmonary:     Effort: Pulmonary effort is normal.     Breath sounds: Normal breath sounds.  Abdominal:     General: Bowel sounds are normal.     Palpations: Abdomen is soft.  Musculoskeletal:     Right lower leg: Edema present.     Left lower leg: Edema present.     Comments: Patient with edema and chronic venous stasis bilateral lower extremities No evidence of acute abnormalities noted  Skin:    General: Skin is warm and dry.     Capillary Refill: Capillary refill takes less than 2 seconds.  Neurological:     General: No focal deficit present.     Mental Status: He is alert.  Psychiatric:        Mood and Affect: Mood normal.     ED Results / Procedures / Treatments   Labs (all labs ordered are listed, but only abnormal results are displayed) Labs Reviewed  BASIC METABOLIC PANEL - Abnormal; Notable for the following components:      Result Value    Potassium 3.2 (*)    Glucose, Bld 229 (*)    All other components within normal limits  CBC - Abnormal; Notable for the following components:   Hemoglobin 11.9 (*)    HCT 38.4 (*)    MCH 25.0 (*)    All other components within normal limits  TROPONIN I (HIGH SENSITIVITY) - Abnormal; Notable for the following components:   Troponin I (High Sensitivity) 31 (*)    All other components within normal limits  TROPONIN I (HIGH SENSITIVITY)    EKG EKG Interpretation  Date/Time:  Sunday December 12 2019 13:25:35 EDT Ventricular Rate:  94 PR Interval:  168 QRS Duration: 90 QT Interval:  352 QTC Calculation: 440 R Axis:   -4 Text Interpretation: Normal sinus rhythm Septal infarct , age undetermined Abnormal ECG Poor data quality in current ECG precludes serial comparison Confirmed by Pattricia Boss [phone]) on 12/12/2019 3:42:36 PM   Radiology DG Chest 2 View  Result Date: 12/12/2019 CLINICAL DATA:  Chest pain.  History of high blood pressure. EXAM: CHEST - 2 VIEW COMPARISON:  December 31, 2017 FINDINGS: Stable cardiomegaly. A tortuous thoracic aorta is again identified. The hila and mediastinum are otherwise normal. No pneumothorax. No nodules or masses. No focal infiltrates or overt edema. IMPRESSION: No active cardiopulmonary disease. Electronically Signed   By: Dorise Bullion III M.D   On: 12/12/2019 14:11    Procedures Procedures (including critical care time)  Medications Ordered in ED Medications - No data to display  ED Course  I have reviewed the triage vital signs and the nursing notes.  Pertinent labs & imaging results that were available during my care of the patient were reviewed by me and considered in my medical decision making (see chart for details).    MDM Rules/Calculators/A&P                          Discussed with cardiology on call.  Advised they reviewed recent cath and would d/c from cardiology perspective. Awaiting CTA and will d/c if negative CTA  negative.  Patient very sleepy from Ativan.  Chest pain is resolved. Plan discharge. Discussed with Dr. Kathrynn Humble  and he will discharge when awake Final Clinical Impression(s) / ED Diagnoses Final diagnoses:  Chest pain, unspecified type    Rx / DC Orders ED Discharge Orders    None       Pattricia Boss, MD 12/12/19 2329

## 2019-12-13 NOTE — ED Notes (Signed)
This RN went to assess how patient is going to get home, patient states he will call a cab. Patient also, again, explained how he needs rehab and that he can't get up without assistance and has difficulty ambulating. This RN and charge RN in to speak with patient, social work consult to be put in so they can call patient at home to discuss options/available resources for patient. Patient agrees with and verbalizes understanding of same.   Patient's working cell number is [phone]

## 2019-12-13 NOTE — ED Notes (Signed)
MD spoke to patient and explained his options, patient declined to speak with our case management regarding further options. Discharge remains in place.

## 2019-12-13 NOTE — ED Notes (Signed)
Patient easily aroused by verbal stimuli upon nurse entering room. Patient updated on plan of care, verbalized understanding but states they he needs rehab for his legs due to the swelling and being unable to walk well. This RN made provided aware, he is in to speak with patient regarding his options.

## 2019-12-16 ENCOUNTER — Other Ambulatory Visit: Payer: Self-pay

## 2019-12-16 ENCOUNTER — Encounter (HOSPITAL_COMMUNITY): Payer: Self-pay | Admitting: Emergency Medicine

## 2019-12-16 ENCOUNTER — Emergency Department (HOSPITAL_COMMUNITY): Payer: Medicare HMO

## 2019-12-16 ENCOUNTER — Emergency Department (HOSPITAL_COMMUNITY)
Admission: EM | Admit: 2019-12-16 | Discharge: 2019-12-16 | Disposition: A | Discharge status: Home / Self Care | Payer: Medicare HMO | Attending: Emergency Medicine | Admitting: Emergency Medicine

## 2019-12-16 DIAGNOSIS — Z87891 Personal history of nicotine dependence: Secondary | ICD-10-CM | POA: Insufficient documentation

## 2019-12-16 DIAGNOSIS — R0602 Shortness of breath: Secondary | ICD-10-CM

## 2019-12-16 DIAGNOSIS — Z7982 Long term (current) use of aspirin: Secondary | ICD-10-CM | POA: Diagnosis not present

## 2019-12-16 DIAGNOSIS — R609 Edema, unspecified: Secondary | ICD-10-CM | POA: Diagnosis not present

## 2019-12-16 DIAGNOSIS — R10817 Generalized abdominal tenderness: Secondary | ICD-10-CM | POA: Insufficient documentation

## 2019-12-16 DIAGNOSIS — I110 Hypertensive heart disease with heart failure: Secondary | ICD-10-CM | POA: Insufficient documentation

## 2019-12-16 DIAGNOSIS — E1140 Type 2 diabetes mellitus with diabetic neuropathy, unspecified: Secondary | ICD-10-CM | POA: Diagnosis not present

## 2019-12-16 DIAGNOSIS — I5033 Acute on chronic diastolic (congestive) heart failure: Secondary | ICD-10-CM | POA: Diagnosis not present

## 2019-12-16 DIAGNOSIS — Z794 Long term (current) use of insulin: Secondary | ICD-10-CM | POA: Insufficient documentation

## 2019-12-16 DIAGNOSIS — R2243 Localized swelling, mass and lump, lower limb, bilateral: Secondary | ICD-10-CM | POA: Diagnosis present

## 2019-12-16 DIAGNOSIS — Z79899 Other long term (current) drug therapy: Secondary | ICD-10-CM | POA: Insufficient documentation

## 2019-12-16 DIAGNOSIS — I10 Essential (primary) hypertension: Secondary | ICD-10-CM

## 2019-12-16 LAB — CBC
HCT: 38.2 % — ABNORMAL LOW (ref 39.0–52.0)
Hemoglobin: 12.1 g/dL — ABNORMAL LOW (ref 13.0–17.0)
MCH: 25.7 pg — ABNORMAL LOW (ref 26.0–34.0)
MCHC: 31.7 g/dL (ref 30.0–36.0)
MCV: 81.3 fL (ref 80.0–100.0)
Platelets: 195 10*3/uL (ref 150–400)
RBC: 4.7 MIL/uL (ref 4.22–5.81)
RDW: 14.1 % (ref 11.5–15.5)
WBC: 3.4 10*3/uL — ABNORMAL LOW (ref 4.0–10.5)
nRBC: 0 % (ref 0.0–0.2)

## 2019-12-16 LAB — BASIC METABOLIC PANEL
Anion gap: 13 (ref 5–15)
BUN: 9 mg/dL (ref 6–20)
CO2: 22 mmol/L (ref 22–32)
Calcium: 8.9 mg/dL (ref 8.9–10.3)
Chloride: 104 mmol/L (ref 98–111)
Creatinine, Ser: 1.09 mg/dL (ref 0.61–1.24)
GFR, Estimated: >60 mL/min (ref >60)
Glucose, Bld: 302 mg/dL — ABNORMAL HIGH (ref 70–99)
Potassium: 3.1 mmol/L — ABNORMAL LOW (ref 3.5–5.1)
Sodium: 139 mmol/L (ref 135–145)

## 2019-12-16 LAB — TROPONIN I (HIGH SENSITIVITY): Troponin I (High Sensitivity): 29 ng/L — ABNORMAL HIGH (ref <18)

## 2019-12-16 LAB — BRAIN NATRIURETIC PEPTIDE: B Natriuretic Peptide: 63.6 pg/mL (ref 0.0–100.0)

## 2019-12-16 MED ORDER — NITROGLYCERIN 2 % TD OINT
0.5000 [in_us] | TOPICAL_OINTMENT | Freq: Once | TRANSDERMAL | Status: AC
  Administered 2019-12-16: 0.5 [in_us] via TOPICAL
  Filled 2019-12-16: qty 1

## 2019-12-16 MED ORDER — FUROSEMIDE 10 MG/ML IJ SOLN
60.0000 mg | Freq: Once | INTRAMUSCULAR | Status: AC
  Administered 2019-12-16: 60 mg via INTRAVENOUS
  Filled 2019-12-16: qty 6

## 2019-12-16 MED ORDER — POTASSIUM CHLORIDE CRYS ER 20 MEQ PO TBCR
40.0000 meq | EXTENDED_RELEASE_TABLET | Freq: Once | ORAL | Status: AC
  Administered 2019-12-16: 40 meq via ORAL
  Filled 2019-12-16: qty 2

## 2019-12-16 MED ORDER — ATENOLOL 50 MG PO TABS
25.0000 mg | ORAL_TABLET | Freq: Once | ORAL | Status: AC
  Administered 2019-12-16: 25 mg via ORAL
  Filled 2019-12-16: qty 1

## 2019-12-16 MED ORDER — CLONIDINE HCL 0.2 MG PO TABS
0.3000 mg | ORAL_TABLET | Freq: Once | ORAL | Status: AC
  Administered 2019-12-16: 0.3 mg via ORAL
  Filled 2019-12-16: qty 1

## 2019-12-16 MED ORDER — AMLODIPINE BESYLATE 5 MG PO TABS
10.0000 mg | ORAL_TABLET | Freq: Once | ORAL | Status: AC
  Administered 2019-12-16: 10 mg via ORAL
  Filled 2019-12-16: qty 2

## 2019-12-16 NOTE — ED Triage Notes (Signed)
Patient arrives to ED with c/o increased leg swelling and shortness of breath over the past week. States he cant get the fluid off of him. Hx CHF. States he cannot do ADLs without help now.

## 2019-12-16 NOTE — Progress Notes (Signed)
   12/16/19 1910  TOC ED Mini Assessment  TOC Time spent with patient (minutes): 60  PING Used in TOC Assessment Yes  Admission or Readmission Diverted Yes  Interventions which prevented an admission or readmission [place] or Services  What brought you to the Emergency Department?  c/o increase  Barrier interventions Arranged [facility] services with [employer] of departure [employer].gov Compare Post Acute Care list provided to: Patient  Choice offered to / list presented to  Patient  ED CM met  with patient at bedside to discuss transitions of care options.  Patient reports recent discharge from SNF in [place], [place] contacted the facility and was informed that patient does not have anymore Rehab days.  CM explained to patient that he would be self pay if he wanted to [place], patient stated he could not afford pay out of pocket. Discussed [place] services patient has opted for [facility].  CM explained that I would fax referral to [facility] that are within network with patient's insurance. [facility] and [facility] is within network.  Patient verbalized understanding and teach back done.  Updated EDP of discharge plan.

## 2019-12-16 NOTE — Discharge Instructions (Signed)
Please take your medications as prescribed.  It is vitally important for you to take these medicines so that you do not become short of breath again.  I have talked with our social worker to help set you up with home health so you have some support at home. You may return to the ER at anytime for any new or concerning symptoms.  You must follow up with your primary care doctor in the next few days for recheck of blood pressure and to follow up on your labwork today.

## 2019-12-16 NOTE — ED Provider Notes (Signed)
[facility] EMERGENCY DEPARTMENT Provider Note   CSN: [id number] Arrival date & time: 12/16/19  1313     History Chief Complaint  Patient presents with  . Leg Swelling  . Shortness of Breath    [name] is a 59 y.o. male.  HPI Patient is a 59 year old male with a past medical history significant for CHF, depression, DM, HTN, arthritis.  He states he is not been taking his medications the past 2 days.  He is uncertain what Lasix dose he takes and states that he relies on other people to take care of him including his nephew however he states that no one has been around to help him for the past week.  Patient presents emergency department today because he was to weak to lift himself of his bed today.  He states that his legs are very heavy and swollen.  He denies any chest pain currently states that he was seen a few days ago for this.  States he has had a stroke in the past and that his right side is very weak as a result which makes it more difficult for him to do his ADLs. He states he is also been feeling very short of breath and that his leg swelling is increased this past week.   On my review of EMR it appears that patient recently had a left heart cath 06/30/2019 at [facility] which showed small caliber subtotal occlusion of OM1 branch cardiology recommended medical management.  Patient has a history of cocaine abuse and was therefore not placed on beta blockers. He was discharged on atorvastatin, hydralazine, amlodipine, aspirin daily, clonidine, lisinopril  Patient had bilateral lower extremity ultrasound at [facility] 11/03/2019 which was negative for any evidence of DVT.  The head contrast echo done 11/03/2019 which showed preserved EF mild diastolic dysfunction mild left atrial enlargement    Past Medical History:  Diagnosis Date  . Arthritis   . Bipolar affective ([place])   . CHF (congestive heart failure) ([place])   . Depression   . Diabetes mellitus     uncontrolled   . Hypertension     Patient Active Problem List   Diagnosis Date Noted  . Acute on chronic diastolic CHF (congestive heart failure) ([place]) 12/31/2017  . Poorly controlled diabetes mellitus ([place])   . Pure hypercholesterolemia   . Hyperglycemia 09/28/2017  . Nonobstructive atherosclerosis of coronary artery 09/28/2017  . Chronic HFrEF (heart failure with reduced ejection fraction) ([place]) 09/28/2017  . MDD (major depressive disorder), recurrent episode, severe ([place]) 07/30/2017  . Osteoarthritis 04/12/2015  . Severe recurrent major depression with psychotic features ([place]) 01/04/2015  . Substance-related disorder ([place]) 10/30/2014  . Patient's noncompliance with other medical treatment and regimen 06/07/2014  . Cocaine dependence, uncomplicated ([place]) 62/26/3335  . Chronic pain 05/31/2014  . Cocaine abuse ([place]) 05/31/2014  . History of noncompliance with medical treatment 05/31/2014  . Malingering 05/31/2014  . Pancytopenia ([place]) 05/31/2014  . Diabetic neuropathy associated with diabetes mellitus due to underlying condition ([place]) 05/31/2014  . Diabetic neuropathy ([place]) 05/31/2014  . Congestive heart failure with left ventricular systolic dysfunction ([place]) 02/22/2014  . Depression, major, recurrent, severe with psychosis ([place]) 08/29/2013  . Suicidal ideations 08/28/2013  . Uncontrolled diabetes mellitus ([place]) 06/30/2013  . Atypical chest pain 12/09/2011  . HTN (hypertension), benign 12/09/2011  . Episodic mood disorder ([place]) 05/13/2011    Past Surgical History:  Procedure Laterality Date  . KNEE SURGERY     b/l knees; pending total  knee        Family History  Problem Relation Age of Onset  . Diabetes Other   . Cancer Mother        uterus; deceased   . Heart attack Mother 83  . Hypertension Other   . Heart attack Father 63  . Stroke Sister        12/2012 age 55 y.o    Social History   Tobacco Use  . Smoking status: Former Research scientist (life sciences)  . Smokeless tobacco: Never  Used  Vaping Use  . Vaping Use: Never used  Substance Use Topics  . Alcohol use: No  . Drug use: Yes    Types: Cocaine    Home Medications Prior to Admission medications   Medication Sig Start Date End Date Taking? Authorizing Provider  amLODipine (NORVASC) 10 MG tablet Take 10 mg by mouth daily.  12/08/17   [provider]  amLODipine (NORVASC) 5 MG tablet Take 1 tablet (5 mg total) by mouth daily. Patient not taking: Reported on 01/29/2018 10/01/17   Molli Hazard A, DO  aspirin EC 81 MG EC tablet Take 1 tablet (81 mg total) by mouth daily. Patient taking differently: Take 325 mg by mouth daily.  10/02/17   Seawell, Jaimie A, DO  atenolol (TENORMIN) 25 MG tablet Take 25 mg by mouth daily.  10/15/19   [provider]  atorvastatin (LIPITOR) 40 MG tablet Take 0.5 tablets (20 mg total) by mouth daily at 6 PM. Patient not taking: Reported on 12/31/2017 10/01/17   Seawell, Jaimie A, DO  benazepril (LOTENSIN) 10 MG tablet Take 1 tablet (10 mg total) by mouth daily. Patient not taking: Reported on 12/12/2019 10/02/17   Molli Hazard A, DO  cloNIDine (CATAPRES) 0.2 MG tablet Take 0.2 mg by mouth 2 (two) times daily as needed (hbp).  07/01/19   [provider]  furosemide (LASIX) 40 MG tablet Take 1.5 tablets (60 mg total) by mouth 2 (two) times daily. Patient taking differently: Take 40 mg by mouth 2 (two) times daily.  01/02/18 12/12/19  Aline August, MD  gabapentin (NEURONTIN) 400 MG capsule Take 400 mg by mouth 2 (two) times daily.  01/24/18   [provider]  insulin glargine (LANTUS SOLOSTAR) 100 UNIT/ML Solostar Pen Inject 40 Units into the skin at bedtime.  11/10/19   [provider]  insulin lispro (HUMALOG) 100 UNIT/ML KwikPen AS DIRECTED SUBCUTANEOUS 90 DAYS    [provider]  methocarbamol (ROBAXIN) 500 MG tablet Take 500 mg by mouth every 8 (eight) hours as needed for muscle spasms.  01/24/18   [provider]  omeprazole  (PRILOSEC) 20 MG capsule Take 1 capsule (20 mg total) by mouth daily. Patient not taking: Reported on 12/12/2019 01/29/18   Montine Circle, PA-C    Allergies    Other  Review of Systems   Review of Systems  Constitutional: Positive for fatigue. Negative for chills and fever.  HENT: Negative for congestion.   Eyes: Negative for pain.  Respiratory: Positive for shortness of breath. Negative for cough.   Cardiovascular: Positive for chest pain and leg swelling.  Gastrointestinal: Negative for abdominal pain and vomiting.  Genitourinary: Negative for dysuria.  Musculoskeletal: Negative for myalgias.  Skin: Negative for rash.  Neurological: Negative for dizziness and headaches.    Physical Exam Updated Vital Signs BP (!) 162/103   Pulse 77   Temp 98.1 F (36.7 C) (Oral)   Resp 16   SpO2 99%   Physical Exam Vitals  and nursing note reviewed.  Constitutional:      General: He is not in acute distress.    Comments: Uncomfortable 59 year old male speaking in full sentences  HENT:     Head: Normocephalic and atraumatic.     Nose: Nose normal.     Mouth/Throat:     Mouth: Mucous membranes are dry.  Eyes:     General: No scleral icterus. Neck:     Comments: JVP more prominent with hepatojugular reflux Cardiovascular:     Rate and Rhythm: Normal rate and regular rhythm.     Pulses: Normal pulses.     Heart sounds: Normal heart sounds.  Pulmonary:     Effort: Pulmonary effort is normal. No respiratory distress.     Breath sounds: Normal breath sounds. No wheezing.  Abdominal:     Palpations: Abdomen is soft.     Tenderness: There is abdominal tenderness. There is no guarding or rebound.     Comments: Diffuse abdominal tenderness of to palpation.  Musculoskeletal:     Cervical back: Normal range of motion.     Right lower leg: Edema present.     Left lower leg: Edema present.     Comments: Bilateral 3+ pitting edema to the level of the proximal shin  Skin:    General:  Skin is warm and dry.     Capillary Refill: Capillary refill takes less than 2 seconds.  Neurological:     Mental Status: He is alert. Mental status is at baseline.  Psychiatric:        Mood and Affect: Mood normal.        Behavior: Behavior normal.     ED Results / Procedures / Treatments   Labs (all labs ordered are listed, but only abnormal results are displayed) Labs Reviewed  BASIC METABOLIC PANEL - Abnormal; Notable for the following components:      Result Value   Potassium 3.1 (*)    Glucose, Bld 302 (*)    All other components within normal limits  CBC - Abnormal; Notable for the following components:   WBC 3.4 (*)    Hemoglobin 12.1 (*)    HCT 38.2 (*)    MCH 25.7 (*)    All other components within normal limits  TROPONIN I (HIGH SENSITIVITY) - Abnormal; Notable for the following components:   Troponin I (High Sensitivity) 29 (*)    All other components within normal limits  BRAIN NATRIURETIC PEPTIDE  TSH    EKG EKG Interpretation  Date/Time:  Thursday December 16 2019 15:07:28 EDT Ventricular Rate:  87 PR Interval:    QRS Duration: 91 QT Interval:  392 QTC Calculation: 472 R Axis:   103 Text Interpretation: Sinus rhythm Anterior infarct, old No significant change since last tracing Confirmed by Isla Pence [phone]) on 12/16/2019 3:36:43 PM   Radiology DG Chest 2 View  Result Date: 12/16/2019 CLINICAL DATA:  Shortness of breath and BILATERAL lower extremity edema for 1 week; history CHF, diabetes mellitus, hypertension, recent stroke EXAM: CHEST - 2 VIEW COMPARISON:  12/12/2019 FINDINGS: Upper normal heart size. Mediastinal contours and pulmonary vascularity normal. Lungs clear. No infiltrate, pleural effusion, or pneumothorax. Prior cervical fusion. IMPRESSION: No acute abnormalities. Electronically Signed   By: Lavonia Dana M.D.   On: 12/16/2019 13:47    Procedures Procedures (including critical care time)  Medications Ordered in ED Medications    nitroGLYCERIN (NITROGLYN) 2 % ointment 0.5 inch (0.5 inches Topical Given 12/16/19 1600)  furosemide (  LASIX) injection 60 mg (60 mg Intravenous Given 12/16/19 1603)  potassium chloride SA (KLOR-CON) CR tablet 40 mEq (40 mEq Oral Given 12/16/19 1615)  amLODipine (NORVASC) tablet 10 mg (10 mg Oral Given 12/16/19 1718)  atenolol (TENORMIN) tablet 25 mg (25 mg Oral Given 12/16/19 1718)  cloNIDine (CATAPRES) tablet 0.3 mg (0.3 mg Oral Given 12/16/19 1718)    ED Course  I have reviewed the triage vital signs and the nursing notes.  Pertinent labs & imaging results that were available during my care of the patient were reviewed by me and considered in my medical decision making (see chart for details).  Patient is a 59 year old male with past medical history detailed below presented today for increased leg swelling and shortness of breath over the past week.  He has not been taking his medications and he is prescribed multiple antihypertensives.  Nitro paste is placed on chest.  Patient given home doses of amlodipine, atenolol, clonidine and 60 mg of Lasix IV.  Also given 40 mEq of potassium as he is mildly hypokalemic and getting potassium right now.  Clinical Course as of Dec 15 1845  Thu Dec 16, 2019  1640 I discussed this case with my attending MD (Dr. Sherry Ruffing) who cosigned this note including patient's presenting symptoms, physical exam, and planned diagnostics and interventions. Attending physician stated agreement with plan or made changes to plan which were implemented.    [WF]  6073 700 urine output so far BP remains elevated after nitro paste administered   [WF]  1757 Now 1800 mL of OP total   [WF]    Clinical Course User Index [WF] Pati Gallo S, PA   EKG unchanged today.  Troponin at baseline for patient.  BNP 63.  BMP with mild hypokalemia.  Some elevation in blood sugar today.  CBC with mild leukopenia mild anemia no other significant abnormalities.  Chest x-ray without  any acute abnormalities no evidence of fluid overload however physical exam is notable for JVP and significant peripheral edema.   MDM Rules/Calculators/A&P                          Patient has been set up with home health nurse, social work, PT OT.  He understands need for close follow-up with PCP.  Blood pressure is significantly improved short of breath.   Asymptomatic at this time.  No significant improvement in peripheral edema however patient continues to have no tachycardia has improved shortness of breath and improved blood pressure.  More of a hypertensive urgency no evidence of pulmonary edema on chest x-ray.  Discussed with Dr. Sherry Ruffing before discharge.  Patient agreeable to plan.  Ambulated without desaturation oxygen.  Final Clinical Impression(s) / ED Diagnoses Final diagnoses:  Peripheral edema  Hypertension, unspecified type  Shortness of breath    Rx / DC Orders ED Discharge Orders         [place]        12/16/19 1756    Face-to-face encounter (required for Medicare/Medicaid patients)       Comments: I Tedd Sias certify that this patient is under my care and that I, or a nurse practitioner or physician's assistant working with me, had a face-to-face encounter that meets the physician face-to-face encounter requirements with this patient on 12/16/2019. The encounter with the patient was in whole, or in part for the following medical condition(s) which is the primary reason for home health  care (List medical condition): Pain with ambulation, weakness that inhibits his ADLs, difficulty maintaining his medication regimen and noncompliance as result of this.   12/16/19 1756           Tedd Sias, PA 12/16/19 1850    Tegeler, Gwenyth Allegra, MD 12/16/19 2328

## 2019-12-20 ENCOUNTER — Telehealth: Payer: Self-pay | Admitting: *Deleted

## 2019-12-20 NOTE — Telephone Encounter (Signed)
Patient returned to the ED lobby. ED CM spoke with the patient in the lobby. He states he is not being seen. He is here with his father who is a patient. He states home health never called him following discharged from the ED last week. He thought a walker was being delivered to his home but has not received one. Patient to [facility] on 12/16/19. ED CM faxed orders to [facility] this evening in an attempt to arrange care. Fax cover sheet to [facility] requested a call to [facility] at [phone] if needed. ED CM informed the patient his orders have been faxed to [facility]. Patient provided with the contact information for [facility]. Informed he does not have a pending DME order from the ED.

## 2019-12-20 NOTE — Telephone Encounter (Signed)
ED CSW states the patient needs assistance related to a recent ED visit and is in the ED lobby. ED CM went to the ED waiting from to see the patient. ED NT states the patient reported he was seen by home health within the last week and was told they will get a walker for him but have not delivered one yet. He has left the ED lobby and gone to the cafeteria. ED NT states the patient came by the ED while he was here visiting someone he knows. ED CM provided ED NT the telephone number to call if the patient returns.

## 2019-12-25 ENCOUNTER — Other Ambulatory Visit: Payer: Self-pay

## 2019-12-25 ENCOUNTER — Emergency Department (HOSPITAL_COMMUNITY): Payer: Medicare HMO

## 2019-12-25 ENCOUNTER — Emergency Department (HOSPITAL_COMMUNITY)
Admission: EM | Admit: 2019-12-25 | Discharge: 2019-12-25 | Disposition: A | Discharge status: Home / Self Care | Payer: Medicare HMO | Attending: Emergency Medicine | Admitting: Emergency Medicine

## 2019-12-25 DIAGNOSIS — R600 Localized edema: Secondary | ICD-10-CM | POA: Insufficient documentation

## 2019-12-25 DIAGNOSIS — Z9114 Patient's other noncompliance with medication regimen: Secondary | ICD-10-CM | POA: Diagnosis not present

## 2019-12-25 DIAGNOSIS — E119 Type 2 diabetes mellitus without complications: Secondary | ICD-10-CM | POA: Insufficient documentation

## 2019-12-25 DIAGNOSIS — I5033 Acute on chronic diastolic (congestive) heart failure: Secondary | ICD-10-CM | POA: Insufficient documentation

## 2019-12-25 DIAGNOSIS — Z7982 Long term (current) use of aspirin: Secondary | ICD-10-CM | POA: Insufficient documentation

## 2019-12-25 DIAGNOSIS — E1140 Type 2 diabetes mellitus with diabetic neuropathy, unspecified: Secondary | ICD-10-CM | POA: Insufficient documentation

## 2019-12-25 DIAGNOSIS — R609 Edema, unspecified: Secondary | ICD-10-CM

## 2019-12-25 DIAGNOSIS — I110 Hypertensive heart disease with heart failure: Secondary | ICD-10-CM | POA: Diagnosis not present

## 2019-12-25 DIAGNOSIS — I10 Essential (primary) hypertension: Secondary | ICD-10-CM

## 2019-12-25 DIAGNOSIS — Z79899 Other long term (current) drug therapy: Secondary | ICD-10-CM | POA: Diagnosis not present

## 2019-12-25 DIAGNOSIS — R2243 Localized swelling, mass and lump, lower limb, bilateral: Secondary | ICD-10-CM | POA: Diagnosis present

## 2019-12-25 DIAGNOSIS — Z87891 Personal history of nicotine dependence: Secondary | ICD-10-CM | POA: Insufficient documentation

## 2019-12-25 DIAGNOSIS — Z794 Long term (current) use of insulin: Secondary | ICD-10-CM | POA: Diagnosis not present

## 2019-12-25 LAB — CBC WITH DIFFERENTIAL/PLATELET
Abs Immature Granulocytes: 0.01 10*3/uL (ref 0.00–0.07)
Basophils Absolute: 0 10*3/uL (ref 0.0–0.1)
Basophils Relative: 1 %
Eosinophils Absolute: 0.2 10*3/uL (ref 0.0–0.5)
Eosinophils Relative: 4 %
HCT: 37 % — ABNORMAL LOW (ref 39.0–52.0)
Hemoglobin: 11.6 g/dL — ABNORMAL LOW (ref 13.0–17.0)
Immature Granulocytes: 0 %
Lymphocytes Relative: 28 %
Lymphs Abs: 1 10*3/uL (ref 0.7–4.0)
MCH: 25.7 pg — ABNORMAL LOW (ref 26.0–34.0)
MCHC: 31.4 g/dL (ref 30.0–36.0)
MCV: 82 fL (ref 80.0–100.0)
Monocytes Absolute: 0.5 10*3/uL (ref 0.1–1.0)
Monocytes Relative: 13 %
Neutro Abs: 1.9 10*3/uL (ref 1.7–7.7)
Neutrophils Relative %: 54 %
Platelets: 229 10*3/uL (ref 150–400)
RBC: 4.51 MIL/uL (ref 4.22–5.81)
RDW: 14.4 % (ref 11.5–15.5)
WBC: 3.6 10*3/uL — ABNORMAL LOW (ref 4.0–10.5)
nRBC: 0 % (ref 0.0–0.2)

## 2019-12-25 LAB — COMPREHENSIVE METABOLIC PANEL
ALT: 17 U/L (ref 0–44)
AST: 21 U/L (ref 15–41)
Albumin: 2.7 g/dL — ABNORMAL LOW (ref 3.5–5.0)
Alkaline Phosphatase: 96 U/L (ref 38–126)
Anion gap: 11 (ref 5–15)
BUN: 20 mg/dL (ref 6–20)
CO2: 25 mmol/L (ref 22–32)
Calcium: 8.8 mg/dL — ABNORMAL LOW (ref 8.9–10.3)
Chloride: 101 mmol/L (ref 98–111)
Creatinine, Ser: 1.28 mg/dL — ABNORMAL HIGH (ref 0.61–1.24)
GFR, Estimated: >60 mL/min (ref >60)
Glucose, Bld: 432 mg/dL — ABNORMAL HIGH (ref 70–99)
Potassium: 3.2 mmol/L — ABNORMAL LOW (ref 3.5–5.1)
Sodium: 137 mmol/L (ref 135–145)
Total Bilirubin: 0.5 mg/dL (ref 0.3–1.2)
Total Protein: 6.9 g/dL (ref 6.5–8.1)

## 2019-12-25 LAB — CBG MONITORING, ED: Glucose-Capillary: 463 mg/dL — ABNORMAL HIGH (ref 70–99)

## 2019-12-25 LAB — BRAIN NATRIURETIC PEPTIDE: B Natriuretic Peptide: 63.9 pg/mL (ref 0.0–100.0)

## 2019-12-25 LAB — LIPASE, BLOOD: Lipase: 86 U/L — ABNORMAL HIGH (ref 11–51)

## 2019-12-25 MED ORDER — ATENOLOL 25 MG PO TABS
25.0000 mg | ORAL_TABLET | Freq: Once | ORAL | Status: AC
  Administered 2019-12-25: 25 mg via ORAL
  Filled 2019-12-25: qty 1

## 2019-12-25 MED ORDER — FUROSEMIDE 40 MG PO TABS: 40.0000 mg | ORAL_TABLET | Freq: Two times a day (BID) | ORAL | 0 refills | Status: DC

## 2019-12-25 MED ORDER — LABETALOL HCL 5 MG/ML IV SOLN
10.0000 mg | Freq: Once | INTRAVENOUS | Status: AC
  Administered 2019-12-25: 10 mg via INTRAVENOUS
  Filled 2019-12-25: qty 4

## 2019-12-25 MED ORDER — LANTUS SOLOSTAR 100 UNIT/ML ~~LOC~~ SOPN: 40.0000 [IU] | PEN_INJECTOR | Freq: Every day | SUBCUTANEOUS | 0 refills | Status: DC

## 2019-12-25 MED ORDER — INSULIN LISPRO (1 UNIT DIAL) 100 UNIT/ML (KWIKPEN): 20.0000 [IU] | PEN_INJECTOR | Freq: Every day | SUBCUTANEOUS | 0 refills | Status: DC

## 2019-12-25 MED ORDER — ATENOLOL 25 MG PO TABS: 25.0000 mg | ORAL_TABLET | Freq: Every day | ORAL | 0 refills | Status: DC

## 2019-12-25 MED ORDER — LANTUS SOLOSTAR 100 UNIT/ML ~~LOC~~ SOPN: 20.0000 [IU] | PEN_INJECTOR | Freq: Every day | SUBCUTANEOUS | 0 refills | Status: DC

## 2019-12-25 MED ORDER — AMLODIPINE BESYLATE 5 MG PO TABS
10.0000 mg | ORAL_TABLET | Freq: Once | ORAL | Status: AC
  Administered 2019-12-25: 10 mg via ORAL
  Filled 2019-12-25: qty 2

## 2019-12-25 MED ORDER — FUROSEMIDE 10 MG/ML IJ SOLN
60.0000 mg | Freq: Once | INTRAMUSCULAR | Status: AC
  Administered 2019-12-25: 60 mg via INTRAVENOUS
  Filled 2019-12-25: qty 8

## 2019-12-25 MED ORDER — BENAZEPRIL HCL 10 MG PO TABS
10.0000 mg | ORAL_TABLET | Freq: Every day | ORAL | 0 refills | Status: DC
Start: 2019-12-25 — End: 2019-12-29

## 2019-12-25 NOTE — ED Provider Notes (Signed)
[facility] DEPT Provider Note   CSN: [id number] Arrival date & time: 12/25/19  1528     History Chief Complaint  Patient presents with  . Leg Swelling    [name] is a 59 y.o. male.  HPI Patient is a 59 year old male with past medical history of CHF, depression, DM, HTN, arthritis, severe lower extremity edema.  He states he has not been taking his medications for some time he is uncertain how long.  I saw the patient 9 days ago and patient states he had not been taking any medications 2 days prior to that.  I suspect that he has been off his medications for much longer.  He is uncertain what dose of Lasix he takes and states that he is reliant on the caregivers to take care of him primarily this is his nephew.  He states that his nephew is not been reliable in his care however.  At time of prior discharge a plan admit to give patient home health however he states that he has not been seen by home health and has not been followed up on.  He states he has taken no medications since he was discharged 9 days ago and has had progressively worsening swelling in both of his legs and had worsening fatigue.  He denies any fevers or chills, no chest pain or shortness of breath.  He states that his legs feel so heavy he cannot lift them now.  He states he is unable to do his ADLs because of his stroke that he had years ago.  I reviewed patient's chart it appears that he had a left heart cath done 06/30/2019.  He is not having any chest pain at all at this time.  No shortness of breath either.  Discussed with Colletta Maryland of [facility] team. Med-assist was set up on 10/21.  I reviewed the RN and TOC notes it appears that there have been delays in setting patient up with home health. Colletta Maryland will look into this and inform.     Past Medical History:  Diagnosis Date  . Arthritis   . Bipolar affective ([place])   . CHF (congestive heart failure) ([place])   . Depression   .  Diabetes mellitus    uncontrolled   . Hypertension     Patient Active Problem List   Diagnosis Date Noted  . Acute on chronic diastolic CHF (congestive heart failure) ([place]) 12/31/2017  . Poorly controlled diabetes mellitus ([place])   . Pure hypercholesterolemia   . Hyperglycemia 09/28/2017  . Nonobstructive atherosclerosis of coronary artery 09/28/2017  . Chronic HFrEF (heart failure with reduced ejection fraction) ([place]) 09/28/2017  . MDD (major depressive disorder), recurrent episode, severe ([place]) 07/30/2017  . Osteoarthritis 04/12/2015  . Severe recurrent major depression with psychotic features ([place]) 01/04/2015  . Substance-related disorder ([place]) 10/30/2014  . Patient's noncompliance with other medical treatment and regimen 06/07/2014  . Cocaine dependence, uncomplicated ([place]) 63/78/5885  . Chronic pain 05/31/2014  . Cocaine abuse ([place]) 05/31/2014  . History of noncompliance with medical treatment 05/31/2014  . Malingering 05/31/2014  . Pancytopenia ([place]) 05/31/2014  . Diabetic neuropathy associated with diabetes mellitus due to underlying condition ([place]) 05/31/2014  . Diabetic neuropathy ([place]) 05/31/2014  . Congestive heart failure with left ventricular systolic dysfunction ([place]) 02/22/2014  . Depression, major, recurrent, severe with psychosis ([place]) 08/29/2013  . Suicidal ideations 08/28/2013  . Uncontrolled diabetes mellitus ([place]) 06/30/2013  . Atypical chest pain 12/09/2011  . HTN (hypertension), benign  12/09/2011  . Episodic mood disorder ([place]) 05/13/2011    Past Surgical History:  Procedure Laterality Date  . KNEE SURGERY     b/l knees; pending total knee        Family History  Problem Relation Age of Onset  . Diabetes Other   . Cancer Mother        uterus; deceased   . Heart attack Mother 70  . Hypertension Other   . Heart attack Father 8  . Stroke Sister        12/2012 age 10 y.o    Social History   Tobacco Use  . Smoking status: Former Research scientist (life sciences)  .  Smokeless tobacco: Never Used  Vaping Use  . Vaping Use: Never used  Substance Use Topics  . Alcohol use: No  . Drug use: Yes    Types: Cocaine    Home Medications Prior to Admission medications   Medication Sig Start Date End Date Taking? Authorizing Provider  amLODipine (NORVASC) 10 MG tablet Take 10 mg by mouth daily.  12/08/17   [provider]  amLODipine (NORVASC) 5 MG tablet Take 1 tablet (5 mg total) by mouth daily. Patient not taking: Reported on 01/29/2018 10/01/17   Molli Hazard A, DO  aspirin EC 81 MG EC tablet Take 1 tablet (81 mg total) by mouth daily. Patient taking differently: Take 325 mg by mouth daily.  10/02/17   Seawell, Jaimie A, DO  atenolol (TENORMIN) 25 MG tablet Take 1 tablet (25 mg total) by mouth daily. 12/25/19 01/24/20  Tedd Sias, PA  atorvastatin (LIPITOR) 40 MG tablet Take 0.5 tablets (20 mg total) by mouth daily at 6 PM. Patient not taking: Reported on 12/31/2017 10/01/17   Seawell, Jaimie A, DO  benazepril (LOTENSIN) 10 MG tablet Take 1 tablet (10 mg total) by mouth daily. 12/25/19 01/24/20  Tedd Sias, PA  cloNIDine (CATAPRES) 0.2 MG tablet Take 0.2 mg by mouth 2 (two) times daily as needed (hbp).  07/01/19   [provider]  furosemide (LASIX) 40 MG tablet Take 1 tablet (40 mg total) by mouth 2 (two) times daily. 12/25/19 01/24/20  Tedd Sias, PA  gabapentin (NEURONTIN) 400 MG capsule Take 400 mg by mouth 2 (two) times daily.  01/24/18   [provider]  insulin glargine (LANTUS SOLOSTAR) 100 UNIT/ML Solostar Pen Inject 40 Units into the skin at bedtime.  11/10/19   [provider]  insulin lispro (HUMALOG) 100 UNIT/ML KwikPen AS DIRECTED SUBCUTANEOUS 90 DAYS    [provider]  methocarbamol (ROBAXIN) 500 MG tablet Take 500 mg by mouth every 8 (eight) hours as needed for muscle spasms.  01/24/18   [provider]  omeprazole (PRILOSEC) 20 MG capsule Take 1 capsule (20 mg total) by mouth  daily. Patient not taking: Reported on 12/12/2019 01/29/18   Montine Circle, PA-C    Allergies    Other  Review of Systems   Review of Systems  Constitutional: Negative for chills and fever.  HENT: Negative for congestion.   Eyes: Negative for pain.  Respiratory: Negative for cough and shortness of breath.   Cardiovascular: Negative for chest pain and leg swelling.       Leg swelling bilaterally  Gastrointestinal: Negative for abdominal pain and vomiting.  Genitourinary: Negative for dysuria.  Musculoskeletal: Negative for myalgias.       Knee pain after fall  Skin: Negative for rash.  Neurological: Negative for dizziness and headaches.    Physical Exam Updated  Vital Signs BP (!) 206/127   Pulse 86   Temp 97.8 F (36.6 C)   Resp 18   SpO2 100%   Physical Exam Vitals and nursing note reviewed.  Constitutional:      General: He is not in acute distress.    Appearance: He is obese.  HENT:     Head: Normocephalic and atraumatic.     Nose: Nose normal.     Mouth/Throat:     Mouth: Mucous membranes are moist.  Eyes:     General: No scleral icterus. Neck:     Comments: No significant JVD Cardiovascular:     Rate and Rhythm: Normal rate and regular rhythm.     Pulses: Normal pulses.     Heart sounds: Normal heart sounds.  Pulmonary:     Effort: Pulmonary effort is normal. No respiratory distress.     Breath sounds: No wheezing.     Comments: No rales.  No tachypnea.  SPO2 100 on room air Abdominal:     Palpations: Abdomen is soft.     Tenderness: There is no abdominal tenderness. There is no guarding or rebound.  Musculoskeletal:     Cervical back: Normal range of motion.     Right lower leg: Edema present.     Left lower leg: Edema present.     Comments: 3+ bilateral pitting edema to lower extremities to the knee  Skin:    General: Skin is warm and dry.     Capillary Refill: Capillary refill takes less than 2 seconds.  Neurological:     Mental Status: He is  alert. Mental status is at baseline.  Psychiatric:        Mood and Affect: Mood normal.        Behavior: Behavior normal.     ED Results / Procedures / Treatments   Labs (all labs ordered are listed, but only abnormal results are displayed) Labs Reviewed  COMPREHENSIVE METABOLIC PANEL - Abnormal; Notable for the following components:      Result Value   Potassium 3.2 (*)    Glucose, Bld 432 (*)    Creatinine, Ser 1.28 (*)    Calcium 8.8 (*)    Albumin 2.7 (*)    All other components within normal limits  LIPASE, BLOOD - Abnormal; Notable for the following components:   Lipase 86 (*)    All other components within normal limits  CBC WITH DIFFERENTIAL/PLATELET - Abnormal; Notable for the following components:   WBC 3.6 (*)    Hemoglobin 11.6 (*)    HCT 37.0 (*)    MCH 25.7 (*)    All other components within normal limits  CBG MONITORING, ED - Abnormal; Notable for the following components:   Glucose-Capillary 463 (*)    All other components within normal limits  BRAIN NATRIURETIC PEPTIDE    EKG EKG Interpretation  Date/Time:  Saturday December 25 2019 16:00:35 EDT Ventricular Rate:  93 PR Interval:    QRS Duration: 128 QT Interval:  421 QTC Calculation: 527 R Axis:   13 Text Interpretation: Sinus rhythm Right atrial enlargement no acute changes from previous, a lot of  artifact. Confirmed by Charlesetta Shanks [phone]) on 12/25/2019 5:26:06 PM   Radiology DG Chest Portable 1 View  Result Date: 12/25/2019 CLINICAL DATA:  Fluid overload, crackles EXAM: PORTABLE CHEST 1 VIEW COMPARISON:  12/16/2019 FINDINGS: Very low lung volumes. Cardiomegaly with vascular congestion. Bibasilar atelectasis. No overt edema or effusions. No acute bony abnormality. IMPRESSION: Very low  lung volumes.  Bibasilar atelectasis. Cardiomegaly, vascular congestion. Electronically Signed   By: Rolm Baptise M.D.   On: 12/25/2019 18:04   DG Knee Complete 4 Views Left  Result Date: 12/25/2019 CLINICAL  DATA:  Fluid overload, bilateral leg swelling EXAM: LEFT KNEE - COMPLETE 4+ VIEW COMPARISON:  None. FINDINGS: Tricompartment degenerative changes, most pronounced in the medial and patellofemoral compartments. No joint effusion. No acute bony abnormality. Specifically, no fracture, subluxation, or dislocation. IMPRESSION: Moderate tricompartment degenerative changes. No acute bony abnormality. Electronically Signed   By: Rolm Baptise M.D.   On: 12/25/2019 18:05   DG Knee Complete 4 Views Right  Result Date: 12/25/2019 CLINICAL DATA:  Fluid overload.  Bilateral leg swelling. EXAM: RIGHT KNEE - COMPLETE 4+ VIEW COMPARISON:  None. FINDINGS: Advanced tricompartment degenerative changes. Large osteophytes seen in the patellofemoral compartment. No acute bony abnormality. Specifically, no fracture, subluxation, or dislocation. IMPRESSION: Advanced degenerative changes.  No acute bony abnormality. Electronically Signed   By: Rolm Baptise M.D.   On: 12/25/2019 18:07    Procedures Procedures (including critical care time)  Medications Ordered in ED Medications  furosemide (LASIX) injection 60 mg (60 mg Intravenous Given 12/25/19 1718)  amLODipine (NORVASC) tablet 10 mg (10 mg Oral Given 12/25/19 1718)  atenolol (TENORMIN) tablet 25 mg (25 mg Oral Given 12/25/19 1719)  labetalol (NORMODYNE) injection 10 mg (10 mg Intravenous Given 12/25/19 1850)    ED Course  I have reviewed the triage vital signs and the nursing notes.  Pertinent labs & imaging results that were available during my care of the patient were reviewed by me and considered in my medical decision making (see chart for details).    MDM Rules/Calculators/A&P                          Patient is 59 year old male with a past medical history for full details he is chronically ill has chronic lower extremity edema is currently hypertensive.  No significant new symptoms today apart from lower extremity swelling he was complaining of the same  symptoms 9 days ago the improved with Lasix and antihypertensives.  Gave him see medications today he will fill these prescriptions which I have provided for him at home.  I have discussed again with case management who will arrange for him to have home health provided him.   Physical exam is notable for lower extremity edema to the knee no crackles he is not hypoxic.  These appear to be chronic issues.  Provided with Lasix and oral antihypertensive medicines.  As he does not significantly lower his blood pressure will provide him with 10 mg of labetalol.  CMP with mild hypokalemia low suspicion for this causing his symptoms today.  Elevated blood sugar likely secondary to his longstanding diabetes.  I represcribed him his home Lantus with needles for injection as well.  He understands the importance of following up with PCP for this. CBC with leukopenia consistent with prior mild anemia consistent with prior.  BMP within normal limits. Bilateral knee x-rays without any acute fracture there is severe osteoarthritis likely secondary to obesity and longstanding peripheral edema.  Chest x-ray without any pulmonary edema.  EKG with no significant changes from prior.  Reviewed with attending physician.  Patient care transferred to Dr. Vallery Ridge who will follow up on his blood pressure and discharge home.  He has full understanding of discharge instructions he understands he needs to call the [place] wellness clinic to  make an appointment he understands he needs to fill his prescriptions and he understands that he will have home health set up for him.  I have discussed with case management twice today to ensure this.  Final Clinical Impression(s) / ED Diagnoses Final diagnoses:  Peripheral edema  Hypertension, unspecified type  Noncompliance with medications    Rx / DC Orders ED Discharge Orders         [place]        12/25/19 1734    Face-to-face encounter (required for  Medicare/Medicaid patients)       Comments: I Tedd Sias certify that this patient is under my care and that I, or a nurse practitioner or physician's assistant working with me, had a face-to-face encounter that meets the physician face-to-face encounter requirements with this patient on 12/25/2019. The encounter with the patient was in whole, or in part for the following medical condition(s) which is the primary reason for home health care (List medical condition): severe limitations in ADLs due to significant lower extremity edema. CHF and weakness from prior stroke limiting patient's ability to get around and take care of himself.   12/25/19 1734    atenolol (TENORMIN) 25 MG tablet  Daily        12/25/19 1845    benazepril (LOTENSIN) 10 MG tablet  Daily        12/25/19 1845    furosemide (LASIX) 40 MG tablet  2 times daily        12/25/19 1845           Tedd Sias, [place] 12/25/19 1900    Charlesetta Shanks, MD 12/26/19 1616

## 2019-12-25 NOTE — Discharge Instructions (Signed)
Our case manager is following up with the home health agencies to ensure that you are set up with a home health nurse and social worker. This will be done early next week.  I am prescribing you your previously prescribed antihypertensive medications and Lasix.  Please fill these medications and take as directed.  Our social worker should also be able to work on getting you a primary care appointment however I strongly recommend you call the phone number for the office I have given you the information for.  You have [name] listed as your primary care you may follow up with that office or the [facility] wellness clinic.

## 2019-12-25 NOTE — TOC Initial Note (Signed)
Transition of Care [facility]) - Initial/Assessment Note    Patient Details  Name: [name] MRN: [id number] Date of Birth: 24-Sep-1960  [facility]) CM/SW Contact:    Verdell Carmine, RN Phone Number: 12/25/2019, 4:48 PM  Clinical Narrative:                  Paatient states he has not been seen by home health. According to note on 10/25 [facility] was faxed information for patient. This Cm called [facility] on call for this weekend, he will look into this and give me a call back. This patient needs to have some follow up and frequency of [place] visits.        Patient Goals and CMS Choice        Expected Discharge Plan and Services                                                Prior Living Arrangements/Services                       Activities of Daily Living      Permission Sought/Granted                  Emotional Assessment              Admission diagnosis:  Fall with Leg Swelling and Constipation Patient Active Problem List   Diagnosis Date Noted  . Acute on chronic diastolic CHF (congestive heart failure) ([place]) 12/31/2017  . Poorly controlled diabetes mellitus ([place])   . Pure hypercholesterolemia   . Hyperglycemia 09/28/2017  . Nonobstructive atherosclerosis of coronary artery 09/28/2017  . Chronic HFrEF (heart failure with reduced ejection fraction) ([place]) 09/28/2017  . MDD (major depressive disorder), recurrent episode, severe ([place]) 07/30/2017  . Osteoarthritis 04/12/2015  . Severe recurrent major depression with psychotic features ([place]) 01/04/2015  . Substance-related disorder ([place]) 10/30/2014  . Patient's noncompliance with other medical treatment and regimen 06/07/2014  . Cocaine dependence, uncomplicated ([place]) 60/63/0160  . Chronic pain 05/31/2014  . Cocaine abuse ([place]) 05/31/2014  . History of noncompliance with medical treatment 05/31/2014  . Malingering 05/31/2014  . Pancytopenia ([place]) 05/31/2014  . Diabetic  neuropathy associated with diabetes mellitus due to underlying condition ([place]) 05/31/2014  . Diabetic neuropathy ([place]) 05/31/2014  . Congestive heart failure with left ventricular systolic dysfunction ([place]) 02/22/2014  . Depression, major, recurrent, severe with psychosis ([place]) 08/29/2013  . Suicidal ideations 08/28/2013  . Uncontrolled diabetes mellitus ([place]) 06/30/2013  . Atypical chest pain 12/09/2011  . HTN (hypertension), benign 12/09/2011  . Episodic mood disorder ([place]) 05/13/2011   PCP:  Care, Jinny Blossom [facility] Pharmacy:   [id number] [address], [place] [address] Phone: [phone] Fax: [phone]     Social Determinants of Health (SDOH) Interventions    Readmission Risk Interventions No flowsheet data found.

## 2019-12-25 NOTE — ED Triage Notes (Signed)
Patient c/o leg swelling x1 week. Reports he has been out of all meds x 1 week.

## 2019-12-26 NOTE — ED Provider Notes (Signed)
Discussed with Colletta Maryland of case management.  She will follow up on patient to confirm that he has received his medications.  She will also look into options for possibly having [facility] deliver medications to the patient.   Pati Gallo [place], [place] 12/26/19 1537    Charlesetta Shanks, MD 12/26/19 1616

## 2019-12-26 NOTE — TOC Progression Note (Signed)
Transition of Care [facility]) - Progression Note    Patient Details  Name: [name] MRN: [id number] Date of Birth: Mar 28, 1960  Transition of [facility]) CM/SW [place], RN Phone Number: 12/26/2019, 3:47 PM  Clinical Narrative:    [name] to [facility] PA discussed delivery of medications to make it easier for patient as well as the setting up of Home Health. With [employer]. Called Pharmacy, patient did not pick up medications. Spoke to brother [name], he did not know that there were medications to pick up. He will go pick them up.[facility] to his [facility], all they need is a card on file for his co-pays. They will keep this in mind. [name], [name] brother will pick up medications and bring to him. Will call PA back to discuss findings.      Barriers to Discharge: Barriers Resolved, No Home Care Agency will accept this patient  Expected Discharge Plan and Services                                                 Social Determinants of Health (SDOH) Interventions    Readmission Risk Interventions No flowsheet data found.

## 2019-12-26 NOTE — TOC Initial Note (Addendum)
Transition of Care [facility]) - Initial/Assessment Note    Patient Details  Name: [name] MRN: [id number] Date of Birth: 26-Aug-1960  Transition of Care [facility]) CM/SW Contact:    Verdell Carmine, RN Phone Number: 12/26/2019, 9:37 AM  Clinical Narrative:                 Called by PA late 10/30 regarding patient lasck of home health . Last note indicated [facility]. Checked with on call Corene Cornea in regard to this. He called me back and indicated that they had called Crystal CM and told her he had not been accepted due to insurance. Will call other agengies to secure home health services orders placed, new face to face for services. [place] accepted pateint for [place] Accepting    Barriers to Discharge: Barriers Resolved, No [facility] will accept this patient   Patient Goals and CMS Choice        Expected Discharge Plan and Services                                                Prior Living Arrangements/Services                       Activities of Daily Living      Permission Sought/Granted                  Emotional Assessment              Admission diagnosis:  Fall with Leg Swelling and Constipation Patient Active Problem List   Diagnosis Date Noted  . Acute on chronic diastolic CHF (congestive heart failure) ([place]) 12/31/2017  . Poorly controlled diabetes mellitus ([place])   . Pure hypercholesterolemia   . Hyperglycemia 09/28/2017  . Nonobstructive atherosclerosis of coronary artery 09/28/2017  . Chronic HFrEF (heart failure with reduced ejection fraction) ([place]) 09/28/2017  . MDD (major depressive disorder), recurrent episode, severe ([place]) 07/30/2017  . Osteoarthritis 04/12/2015  . Severe recurrent major depression with psychotic features ([place]) 01/04/2015  . Substance-related disorder ([place]) 10/30/2014  . Patient's noncompliance with other medical treatment and regimen  06/07/2014  . Cocaine dependence, uncomplicated ([place]) 65/04/5463  . Chronic pain 05/31/2014  . Cocaine abuse ([place]) 05/31/2014  . History of noncompliance with medical treatment 05/31/2014  . Malingering 05/31/2014  . Pancytopenia ([place]) 05/31/2014  . Diabetic neuropathy associated with diabetes mellitus due to underlying condition ([place]) 05/31/2014  . Diabetic neuropathy ([place]) 05/31/2014  . Congestive heart failure with left ventricular systolic dysfunction ([place]) 02/22/2014  . Depression, major, recurrent, severe with psychosis ([place]) 08/29/2013  . Suicidal ideations 08/28/2013  . Uncontrolled diabetes mellitus ([place]) 06/30/2013  . Atypical chest pain 12/09/2011  . HTN (hypertension), benign 12/09/2011  . Episodic mood disorder ([place]) 05/13/2011   PCP:  Care, Jinny Blossom [facility]:   [id number] [address], [place] [address] Phone: [phone] Fax: [phone]     Social Determinants of Health (SDOH) Interventions    Readmission Risk Interventions No flowsheet data found.

## 2019-12-29 ENCOUNTER — Ambulatory Visit (INDEPENDENT_AMBULATORY_CARE_PROVIDER_SITE_OTHER): Payer: Medicare HMO | Admitting: Primary Care

## 2019-12-29 ENCOUNTER — Encounter (INDEPENDENT_AMBULATORY_CARE_PROVIDER_SITE_OTHER): Payer: Self-pay | Admitting: Primary Care

## 2019-12-29 ENCOUNTER — Other Ambulatory Visit: Payer: Self-pay

## 2019-12-29 VITALS — BP 192/121 | HR 88 | Temp 97.3°F | Ht 68.0 in

## 2019-12-29 DIAGNOSIS — I5022 Chronic systolic (congestive) heart failure: Secondary | ICD-10-CM | POA: Diagnosis not present

## 2019-12-29 DIAGNOSIS — Z09 Encounter for follow-up examination after completed treatment for conditions other than malignant neoplasm: Secondary | ICD-10-CM

## 2019-12-29 DIAGNOSIS — Z7689 Persons encountering health services in other specified circumstances: Secondary | ICD-10-CM | POA: Diagnosis not present

## 2019-12-29 DIAGNOSIS — I10 Essential (primary) hypertension: Secondary | ICD-10-CM

## 2019-12-29 DIAGNOSIS — E1165 Type 2 diabetes mellitus with hyperglycemia: Secondary | ICD-10-CM | POA: Diagnosis not present

## 2019-12-29 DIAGNOSIS — E876 Hypokalemia: Secondary | ICD-10-CM

## 2019-12-29 LAB — POCT GLYCOSYLATED HEMOGLOBIN (HGB A1C): Hemoglobin A1C: 10.6 % — AB (ref 4.0–5.6)

## 2019-12-29 LAB — GLUCOSE, POCT (MANUAL RESULT ENTRY): POC Glucose: 232 mg/dl — AB (ref 70–99)

## 2019-12-29 MED ORDER — BENAZEPRIL HCL 40 MG PO TABS: 40.0000 mg | ORAL_TABLET | Freq: Every day | ORAL | 0 refills | Status: DC

## 2019-12-29 MED ORDER — CLONIDINE HCL 0.1 MG PO TABS
0.2000 mg | ORAL_TABLET | Freq: Once | ORAL | Status: AC
  Administered 2019-12-29: 0.2 mg via ORAL

## 2019-12-29 MED ORDER — POTASSIUM CHLORIDE CRYS ER 20 MEQ PO TBCR: 20.0000 meq | EXTENDED_RELEASE_TABLET | Freq: Every day | ORAL | 3 refills | Status: DC

## 2019-12-29 MED ORDER — FUROSEMIDE 40 MG PO TABS: 40.0000 mg | ORAL_TABLET | Freq: Two times a day (BID) | ORAL | 0 refills | Status: DC

## 2019-12-29 MED ORDER — JANUMET 50-1000 MG PO TABS: 1.0000 | ORAL_TABLET | Freq: Two times a day (BID) | ORAL | 1 refills | Status: DC

## 2019-12-29 MED ORDER — DILTIAZEM HCL ER COATED BEADS 180 MG PO CP24
180.0000 mg | ORAL_CAPSULE | Freq: Every day | ORAL | 1 refills | Status: DC
Start: 2019-12-29 — End: 2020-01-31

## 2019-12-29 MED ORDER — LANTUS SOLOSTAR 100 UNIT/ML ~~LOC~~ SOPN: 40.0000 [IU] | PEN_INJECTOR | Freq: Every day | SUBCUTANEOUS | 0 refills | Status: DC

## 2019-12-29 MED ORDER — ATENOLOL 25 MG PO TABS: 25.0000 mg | ORAL_TABLET | Freq: Every day | ORAL | 0 refills | Status: DC

## 2019-12-29 NOTE — Patient Instructions (Signed)

## 2019-12-29 NOTE — Progress Notes (Signed)
New Patient Office Visit  Subjective:  Patient ID: [name], male    DOB: 09-06-1960  Age: 59 y.o. MRN: [id number]  CC:  Chief Complaint  Patient presents with  . New Patient (Initial Visit)    DM    HPI [name] is a 59 year old obese male  presents for establishment of care management of hypertension and diabetes. He is in a power wheel chair unable to bear weight or transfer without assistance.  Previously lived in [place] and family felt he should not be left alone and now staying with his brother and father provide him with assistance. Denies shortness of breath, headaches, chest pain .  He has bilateral lower extremity edema.  He does have polydipsia and polyuria this may be contributed to furosemide (diuretic) denies polyphagia.  Blood pressure 211/160 treated with 0.2 mg of clonidine will recheck blood pressure. Past Medical History:  Diagnosis Date  . Arthritis   . Bipolar affective ([place])   . CHF (congestive heart failure) ([place])   . Depression   . Diabetes mellitus    uncontrolled   . Hypertension     Past Surgical History:  Procedure Laterality Date  . KNEE SURGERY     b/l knees; pending total knee     Family History  Problem Relation Age of Onset  . Diabetes Other   . Cancer Mother        uterus; deceased   . Heart attack Mother 33  . Hypertension Other   . Heart attack Father 82  . Stroke Sister        12/2012 age 57 y.o    Social History   Socioeconomic History  . Marital status: Single    Spouse name: Not on file  . Number of children: Not on file  . Years of education: Not on file  . Highest education level: Not on file  Occupational History  . Not on file  Tobacco Use  . Smoking status: Former Research scientist (life sciences)  . Smokeless tobacco: Never Used  Vaping Use  . Vaping Use: Never used  Substance and Sexual Activity  . Alcohol use: No  . Drug use: Yes    Types: Cocaine  . Sexual activity: Yes    Birth control/protection: Condom  Other  Topics Concern  . 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 Difficulty of Paying Living Expenses: Not on file  Food Insecurity:   . Worried About Charity fundraiser in the Last Year: Not on file  . Ran Out of Food in the Last Year: Not on file  Transportation Needs:   . Lack of Transportation (Medical): Not on file  . Lack of Transportation (Non-Medical): Not on file  Physical Activity:   . Days of Exercise per Week: Not on file  . Minutes of Exercise per Session: Not on file  Stress:   . Feeling of Stress : Not on file  Social Connections:   . Frequency of Communication with Friends and Family: Not on file  . Frequency  of Social Gatherings with Friends and Family: Not on file  . Attends Religious Services: Not on file  . Active Member of Clubs or Organizations: Not on file  . Attends Archivist Meetings: Not on file  . Marital Status: Not on file  Intimate Partner Violence:   . Fear of Current or Ex-Partner: Not on file  . Emotionally Abused: Not on file  . Physically Abused: Not on file  . Sexually Abused: Not on file    ROS Review of Systems  Eyes: Positive for visual disturbance.  Respiratory: Positive for shortness of breath.   Cardiovascular: Positive for chest pain.       Has CHF  Endocrine: Positive for polydipsia and polyuria.  Genitourinary: Positive for frequency and urgency.  Musculoskeletal: Positive for arthralgias, back pain, gait problem, neck pain and neck stiffness.  Skin: Positive for color change.  Neurological: Positive for headaches.       Tx with Advil migraine medication  All other systems reviewed and are  negative.   Objective:   Today's Vitals: BP (!) 192/121 (BP Location: Left Arm, Patient Position: Sitting, Cuff Size: Large)   Pulse 88   Temp (!) 97.3 F (36.3 C) (Temporal)   Ht 5\' 8"  (1.727 m)   SpO2 96%   BMI 37.10 kg/m   Physical Exam Vitals reviewed.  Constitutional:      Appearance: He is obese.  HENT:     Head: Normocephalic.     Right Ear: Tympanic membrane normal.     Left Ear: Tympanic membrane normal.     Nose: Nose normal.  Eyes:     Extraocular Movements: Extraocular movements intact.     Pupils: Pupils are equal, round, and reactive to light.  Cardiovascular:     Rate and Rhythm: Normal rate and regular rhythm.     Heart sounds: Murmur heard.   Pulmonary:     Effort: Pulmonary effort is normal.     Breath sounds: Normal breath sounds.  Abdominal:     General: Bowel sounds are normal.  Musculoskeletal:        General: Swelling present.     Cervical back: Normal range of motion and neck supple.     Right lower leg: Edema present.     Left lower leg: Edema present.     Comments: Since CVA has not been able to walk ( took place first week in September)  Skin:    General: Skin is warm and dry.  Neurological:     Mental Status: He is alert and oriented to person, place, and time.  Psychiatric:        Mood and Affect: Mood normal.        Behavior: Behavior normal.        Thought Content: Thought content normal.        Judgment: Judgment normal.     Assessment & Plan:  [name] was seen today for new patient (initial visit).  Diagnoses and all orders for this visit:  Uncontrolled type 2 diabetes mellitus with hyperglycemia ([place]) A1c measurements: Goal of therapy: Less than 6.5 hemoglobin A1c.  Monitor foods that are high in carbohydrates are the following rice, potatoes, breads, sugars, and pastas.  Reduction in the intake (eating) will assist in lowering your blood sugars. -     HgB A1c 10.6  -     Glucose (CBG)  HTN (hypertension), benign -      cloNIDine (CATAPRES) tablet 0.2 mg  Chronic HFrEF (heart  failure with reduced ejection fraction) ([place]) Refer to cardiology   Encounter to establish care Juluis Mire, NP-C will be your  (PCP) she is mastered prepared . She is skilled to diagnosed and treat illness. Also able to answer health concern as well as continuing care of varied medical conditions, not limited by cause, organ system, or diagnosis.   Hospital discharge follow-up Mr. Roy Froh. [name] is a  59 y/o male who presented to the emergency room  for leg swelling and is following up from  Encounter  12/25/19, patient was discharged from the ED on 12/25/19,   Retrieved fron ED discharge Follow-Ups   1 Schedule an appointment with [facility] (Family Medicine) 2 Schedule an appointment with [place] 3  Hypokalemia  Labs from hospital encounter potassium was 3.2 ranges from 3.1-3.2 added potassium supplement 20 mEq daily. Outpatient Encounter Medications as of 12/29/2019  Medication Sig  . atenolol (TENORMIN) 25 MG tablet Take 1 tablet (25 mg total) by mouth daily.  . benazepril (LOTENSIN) 40 MG tablet Take 1 tablet (40 mg total) by mouth daily.  . furosemide (LASIX) 40 MG tablet Take 1 tablet (40 mg total) by mouth 2 (two) times daily.  Marland Kitchen gabapentin (NEURONTIN) 400 MG capsule Take 400 mg by mouth 2 (two) times daily.   . insulin glargine (LANTUS SOLOSTAR) 100 UNIT/ML Solostar Pen Inject 40 Units into the skin at bedtime.  . [DISCONTINUED] atenolol (TENORMIN) 25 MG tablet Take 1 tablet (25 mg total) by mouth daily.  . [DISCONTINUED] benazepril (LOTENSIN) 10 MG tablet Take 1 tablet (10 mg total) by mouth daily.  . [DISCONTINUED] furosemide (LASIX) 40 MG tablet Take 1 tablet (40 mg total) by mouth 2 (two) times daily.  . [DISCONTINUED] insulin glargine (LANTUS SOLOSTAR) 100 UNIT/ML Solostar Pen Inject 20 Units into the skin at bedtime.  Marland Kitchen diltiazem (CARDIZEM CD) 180 MG 24 hr  capsule Take 1 capsule (180 mg total) by mouth daily.  . potassium chloride SA (KLOR-CON) 20 MEQ tablet Take 1 tablet (20 mEq total) by mouth daily.  . sitaGLIPtin-metformin (JANUMET) 50-1000 MG tablet Take 1 tablet by mouth 2 (two) times daily with a meal.  . [DISCONTINUED] amLODipine (NORVASC) 10 MG tablet Take 10 mg by mouth daily.  (Patient not taking: Reported on 12/29/2019)  . [DISCONTINUED] amLODipine (NORVASC) 5 MG tablet Take 1 tablet (5 mg total) by mouth daily. (Patient not taking: Reported on 01/29/2018)  . [DISCONTINUED] aspirin EC 81 MG EC tablet Take 1 tablet (81 mg total) by mouth daily.  . [DISCONTINUED] atorvastatin (LIPITOR) 40 MG tablet Take 0.5 tablets (20 mg total) by mouth daily at 6 PM. (Patient not taking: Reported on 12/31/2017)  . [DISCONTINUED] cloNIDine (CATAPRES) 0.2 MG tablet Take 0.2 mg by mouth 2 (two) times daily as needed (hbp).   . [DISCONTINUED] insulin lispro (HUMALOG) 100 UNIT/ML KwikPen Inject 20 Units into the skin at bedtime. (Patient not taking: Reported on 12/29/2019)  . [DISCONTINUED] methocarbamol (ROBAXIN) 500 MG tablet Take 500 mg by mouth every 8 (eight) hours as needed for muscle spasms.   . [DISCONTINUED] omeprazole (PRILOSEC) 20 MG capsule Take 1 capsule (20 mg total) by mouth daily. (Patient not taking: Reported on 12/12/2019)  . [EXPIRED] cloNIDine (CATAPRES) tablet 0.2 mg    No facility-administered encounter medications on file as of 12/29/2019.  reviewed hospital encounters labs and imaging  Follow-up: Return in about 3 months (around 03/30/2020) for DM/labs.   [name], NP

## 2019-12-31 ENCOUNTER — Telehealth: Payer: Self-pay | Admitting: Primary Care

## 2019-12-31 ENCOUNTER — Encounter (HOSPITAL_COMMUNITY): Payer: Self-pay | Admitting: Emergency Medicine

## 2019-12-31 ENCOUNTER — Other Ambulatory Visit: Payer: Self-pay

## 2019-12-31 ENCOUNTER — Emergency Department (HOSPITAL_COMMUNITY): Payer: Medicare HMO

## 2019-12-31 ENCOUNTER — Inpatient Hospital Stay (HOSPITAL_COMMUNITY)
Admission: EM | Admit: 2019-12-31 | Discharge: 2020-01-31 | DRG: 981 | Disposition: A | Discharge status: Skilled Nursing Facility | Payer: Medicare HMO | Attending: Internal Medicine | Admitting: Internal Medicine

## 2019-12-31 DIAGNOSIS — E860 Dehydration: Secondary | ICD-10-CM | POA: Diagnosis present

## 2019-12-31 DIAGNOSIS — D649 Anemia, unspecified: Secondary | ICD-10-CM | POA: Diagnosis present

## 2019-12-31 DIAGNOSIS — Z4659 Encounter for fitting and adjustment of other gastrointestinal appliance and device: Secondary | ICD-10-CM

## 2019-12-31 DIAGNOSIS — N1832 Chronic kidney disease, stage 3b: Secondary | ICD-10-CM | POA: Diagnosis present

## 2019-12-31 DIAGNOSIS — R748 Abnormal levels of other serum enzymes: Secondary | ICD-10-CM

## 2019-12-31 DIAGNOSIS — T502X5A Adverse effect of carbonic-anhydrase inhibitors, benzothiadiazides and other diuretics, initial encounter: Secondary | ICD-10-CM | POA: Diagnosis not present

## 2019-12-31 DIAGNOSIS — E440 Moderate protein-calorie malnutrition: Secondary | ICD-10-CM | POA: Insufficient documentation

## 2019-12-31 DIAGNOSIS — F1410 Cocaine abuse, uncomplicated: Secondary | ICD-10-CM

## 2019-12-31 DIAGNOSIS — E11319 Type 2 diabetes mellitus with unspecified diabetic retinopathy without macular edema: Secondary | ICD-10-CM | POA: Diagnosis present

## 2019-12-31 DIAGNOSIS — R109 Unspecified abdominal pain: Secondary | ICD-10-CM

## 2019-12-31 DIAGNOSIS — G9341 Metabolic encephalopathy: Secondary | ICD-10-CM | POA: Diagnosis not present

## 2019-12-31 DIAGNOSIS — I160 Hypertensive urgency: Secondary | ICD-10-CM | POA: Diagnosis not present

## 2019-12-31 DIAGNOSIS — Z79899 Other long term (current) drug therapy: Secondary | ICD-10-CM

## 2019-12-31 DIAGNOSIS — K567 Ileus, unspecified: Secondary | ICD-10-CM

## 2019-12-31 DIAGNOSIS — I130 Hypertensive heart and chronic kidney disease with heart failure and stage 1 through stage 4 chronic kidney disease, or unspecified chronic kidney disease: Secondary | ICD-10-CM | POA: Diagnosis present

## 2019-12-31 DIAGNOSIS — E870 Hyperosmolality and hypernatremia: Secondary | ICD-10-CM | POA: Diagnosis not present

## 2019-12-31 DIAGNOSIS — Z66 Do not resuscitate: Secondary | ICD-10-CM | POA: Diagnosis not present

## 2019-12-31 DIAGNOSIS — E7800 Pure hypercholesterolemia, unspecified: Secondary | ICD-10-CM | POA: Diagnosis present

## 2019-12-31 DIAGNOSIS — R338 Other retention of urine: Secondary | ICD-10-CM | POA: Diagnosis not present

## 2019-12-31 DIAGNOSIS — I10 Essential (primary) hypertension: Secondary | ICD-10-CM

## 2019-12-31 DIAGNOSIS — N049 Nephrotic syndrome with unspecified morphologic changes: Secondary | ICD-10-CM

## 2019-12-31 DIAGNOSIS — E1165 Type 2 diabetes mellitus with hyperglycemia: Secondary | ICD-10-CM | POA: Diagnosis present

## 2019-12-31 DIAGNOSIS — Z8673 Personal history of transient ischemic attack (TIA), and cerebral infarction without residual deficits: Secondary | ICD-10-CM

## 2019-12-31 DIAGNOSIS — N179 Acute kidney failure, unspecified: Secondary | ICD-10-CM

## 2019-12-31 DIAGNOSIS — R81 Glycosuria: Secondary | ICD-10-CM | POA: Diagnosis present

## 2019-12-31 DIAGNOSIS — R531 Weakness: Secondary | ICD-10-CM

## 2019-12-31 DIAGNOSIS — Z823 Family history of stroke: Secondary | ICD-10-CM

## 2019-12-31 DIAGNOSIS — K5939 Other megacolon: Secondary | ICD-10-CM | POA: Diagnosis present

## 2019-12-31 DIAGNOSIS — Z5320 Procedure and treatment not carried out because of patient's decision for unspecified reasons: Secondary | ICD-10-CM | POA: Diagnosis not present

## 2019-12-31 DIAGNOSIS — Z993 Dependence on wheelchair: Secondary | ICD-10-CM

## 2019-12-31 DIAGNOSIS — Z87891 Personal history of nicotine dependence: Secondary | ICD-10-CM

## 2019-12-31 DIAGNOSIS — E872 Acidosis: Secondary | ICD-10-CM | POA: Diagnosis not present

## 2019-12-31 DIAGNOSIS — Z8249 Family history of ischemic heart disease and other diseases of the circulatory system: Secondary | ICD-10-CM

## 2019-12-31 DIAGNOSIS — F1999 Other psychoactive substance use, unspecified with unspecified psychoactive substance-induced disorder: Secondary | ICD-10-CM | POA: Diagnosis present

## 2019-12-31 DIAGNOSIS — Z7189 Other specified counseling: Secondary | ICD-10-CM

## 2019-12-31 DIAGNOSIS — I5033 Acute on chronic diastolic (congestive) heart failure: Secondary | ICD-10-CM | POA: Diagnosis present

## 2019-12-31 DIAGNOSIS — I2510 Atherosclerotic heart disease of native coronary artery without angina pectoris: Secondary | ICD-10-CM | POA: Diagnosis present

## 2019-12-31 DIAGNOSIS — R601 Generalized edema: Secondary | ICD-10-CM

## 2019-12-31 DIAGNOSIS — I5032 Chronic diastolic (congestive) heart failure: Secondary | ICD-10-CM | POA: Diagnosis not present

## 2019-12-31 DIAGNOSIS — F1490 Cocaine use, unspecified, uncomplicated: Secondary | ICD-10-CM | POA: Diagnosis present

## 2019-12-31 DIAGNOSIS — D72819 Decreased white blood cell count, unspecified: Secondary | ICD-10-CM | POA: Diagnosis present

## 2019-12-31 DIAGNOSIS — Z7984 Long term (current) use of oral hypoglycemic drugs: Secondary | ICD-10-CM

## 2019-12-31 DIAGNOSIS — E1140 Type 2 diabetes mellitus with diabetic neuropathy, unspecified: Secondary | ICD-10-CM | POA: Diagnosis present

## 2019-12-31 DIAGNOSIS — N170 Acute kidney failure with tubular necrosis: Secondary | ICD-10-CM | POA: Diagnosis present

## 2019-12-31 DIAGNOSIS — D631 Anemia in chronic kidney disease: Secondary | ICD-10-CM | POA: Diagnosis present

## 2019-12-31 DIAGNOSIS — Z515 Encounter for palliative care: Secondary | ICD-10-CM

## 2019-12-31 DIAGNOSIS — E1121 Type 2 diabetes mellitus with diabetic nephropathy: Secondary | ICD-10-CM | POA: Diagnosis present

## 2019-12-31 DIAGNOSIS — I5189 Other ill-defined heart diseases: Secondary | ICD-10-CM

## 2019-12-31 DIAGNOSIS — Z20822 Contact with and (suspected) exposure to covid-19: Secondary | ICD-10-CM | POA: Diagnosis present

## 2019-12-31 DIAGNOSIS — K8020 Calculus of gallbladder without cholecystitis without obstruction: Secondary | ICD-10-CM | POA: Diagnosis present

## 2019-12-31 DIAGNOSIS — F319 Bipolar disorder, unspecified: Secondary | ICD-10-CM | POA: Diagnosis present

## 2019-12-31 DIAGNOSIS — K5981 Ogilvie syndrome: Secondary | ICD-10-CM | POA: Diagnosis present

## 2019-12-31 DIAGNOSIS — Q231 Congenital insufficiency of aortic valve: Secondary | ICD-10-CM

## 2019-12-31 DIAGNOSIS — R188 Other ascites: Secondary | ICD-10-CM

## 2019-12-31 DIAGNOSIS — E778 Other disorders of glycoprotein metabolism: Secondary | ICD-10-CM | POA: Diagnosis present

## 2019-12-31 DIAGNOSIS — R52 Pain, unspecified: Secondary | ICD-10-CM

## 2019-12-31 DIAGNOSIS — Z833 Family history of diabetes mellitus: Secondary | ICD-10-CM

## 2019-12-31 DIAGNOSIS — E876 Hypokalemia: Principal | ICD-10-CM

## 2019-12-31 DIAGNOSIS — T464X5A Adverse effect of angiotensin-converting-enzyme inhibitors, initial encounter: Secondary | ICD-10-CM | POA: Diagnosis present

## 2019-12-31 DIAGNOSIS — R140 Abdominal distension (gaseous): Secondary | ICD-10-CM

## 2019-12-31 DIAGNOSIS — N401 Enlarged prostate with lower urinary tract symptoms: Secondary | ICD-10-CM | POA: Diagnosis not present

## 2019-12-31 DIAGNOSIS — E1122 Type 2 diabetes mellitus with diabetic chronic kidney disease: Secondary | ICD-10-CM | POA: Diagnosis present

## 2019-12-31 DIAGNOSIS — M6282 Rhabdomyolysis: Secondary | ICD-10-CM | POA: Diagnosis present

## 2019-12-31 DIAGNOSIS — I161 Hypertensive emergency: Secondary | ICD-10-CM | POA: Diagnosis present

## 2019-12-31 DIAGNOSIS — T461X6A Underdosing of calcium-channel blockers, initial encounter: Secondary | ICD-10-CM | POA: Diagnosis present

## 2019-12-31 DIAGNOSIS — D509 Iron deficiency anemia, unspecified: Secondary | ICD-10-CM | POA: Diagnosis present

## 2019-12-31 DIAGNOSIS — Z91138 Patient's unintentional underdosing of medication regimen for other reason: Secondary | ICD-10-CM

## 2019-12-31 DIAGNOSIS — Z794 Long term (current) use of insulin: Secondary | ICD-10-CM

## 2019-12-31 LAB — COMPREHENSIVE METABOLIC PANEL
ALT: 18 U/L (ref 0–44)
AST: 28 U/L (ref 15–41)
Albumin: 2.2 g/dL — ABNORMAL LOW (ref 3.5–5.0)
Alkaline Phosphatase: 90 U/L (ref 38–126)
Anion gap: 10 (ref 5–15)
BUN: 10 mg/dL (ref 6–20)
CO2: 24 mmol/L (ref 22–32)
Calcium: 8.4 mg/dL — ABNORMAL LOW (ref 8.9–10.3)
Chloride: 102 mmol/L (ref 98–111)
Creatinine, Ser: 1.07 mg/dL (ref 0.61–1.24)
GFR, Estimated: >60 mL/min (ref >60)
Glucose, Bld: 252 mg/dL — ABNORMAL HIGH (ref 70–99)
Potassium: 2.5 mmol/L — CL (ref 3.5–5.1)
Sodium: 136 mmol/L (ref 135–145)
Total Bilirubin: 0.6 mg/dL (ref 0.3–1.2)
Total Protein: 5.8 g/dL — ABNORMAL LOW (ref 6.5–8.1)

## 2019-12-31 LAB — CBC WITH DIFFERENTIAL/PLATELET
Abs Immature Granulocytes: 0.01 10*3/uL (ref 0.00–0.07)
Basophils Absolute: 0 10*3/uL (ref 0.0–0.1)
Basophils Relative: 1 %
Eosinophils Absolute: 0.1 10*3/uL (ref 0.0–0.5)
Eosinophils Relative: 3 %
HCT: 34.6 % — ABNORMAL LOW (ref 39.0–52.0)
Hemoglobin: 11 g/dL — ABNORMAL LOW (ref 13.0–17.0)
Immature Granulocytes: 0 %
Lymphocytes Relative: 28 %
Lymphs Abs: 1 10*3/uL (ref 0.7–4.0)
MCH: 25.9 pg — ABNORMAL LOW (ref 26.0–34.0)
MCHC: 31.8 g/dL (ref 30.0–36.0)
MCV: 81.4 fL (ref 80.0–100.0)
Monocytes Absolute: 0.6 10*3/uL (ref 0.1–1.0)
Monocytes Relative: 16 %
Neutro Abs: 1.8 10*3/uL (ref 1.7–7.7)
Neutrophils Relative %: 52 %
Platelets: 266 10*3/uL (ref 150–400)
RBC: 4.25 MIL/uL (ref 4.22–5.81)
RDW: 14.4 % (ref 11.5–15.5)
WBC: 3.5 10*3/uL — ABNORMAL LOW (ref 4.0–10.5)
nRBC: 0 % (ref 0.0–0.2)

## 2019-12-31 LAB — BRAIN NATRIURETIC PEPTIDE: B Natriuretic Peptide: 116.8 pg/mL — ABNORMAL HIGH (ref 0.0–100.0)

## 2019-12-31 LAB — RESPIRATORY PANEL BY RT PCR (FLU A&B, COVID)
Influenza A by PCR: NEGATIVE
Influenza B by PCR: NEGATIVE
SARS Coronavirus 2 by RT PCR: NEGATIVE

## 2019-12-31 LAB — CBG MONITORING, ED: Glucose-Capillary: 213 mg/dL — ABNORMAL HIGH (ref 70–99)

## 2019-12-31 LAB — CK: Total CK: 970 U/L — ABNORMAL HIGH (ref 49–397)

## 2019-12-31 MED ORDER — DILTIAZEM HCL ER COATED BEADS 180 MG PO CP24
180.0000 mg | ORAL_CAPSULE | Freq: Every day | ORAL | Status: DC
  Administered 2020-01-01 – 2020-01-21 (×21): 180 mg via ORAL
  Filled 2019-12-31 (×23): qty 1

## 2019-12-31 MED ORDER — INSULIN GLARGINE 100 UNIT/ML ~~LOC~~ SOLN
40.0000 [IU] | Freq: Every day | SUBCUTANEOUS | Status: DC
  Administered 2020-01-01 (×2): 40 [IU] via SUBCUTANEOUS
  Filled 2019-12-31 (×3): qty 0.4

## 2019-12-31 MED ORDER — GABAPENTIN 400 MG PO CAPS
800.0000 mg | ORAL_CAPSULE | Freq: Two times a day (BID) | ORAL | Status: DC
  Administered 2020-01-01 – 2020-01-13 (×26): 800 mg via ORAL
  Filled 2019-12-31 (×26): qty 2

## 2019-12-31 MED ORDER — MAGNESIUM SULFATE IN D5W 1-5 GM/100ML-% IV SOLN
1.0000 g | Freq: Once | INTRAVENOUS | Status: AC
  Administered 2020-01-01: 1 g via INTRAVENOUS
  Filled 2019-12-31 (×2): qty 100

## 2019-12-31 MED ORDER — SODIUM CHLORIDE 0.9% FLUSH
3.0000 mL | Freq: Two times a day (BID) | INTRAVENOUS | Status: DC
  Administered 2020-01-01 – 2020-01-31 (×26): 3 mL via INTRAVENOUS

## 2019-12-31 MED ORDER — POLYETHYLENE GLYCOL 3350 17 G PO PACK: 17.0000 g | PACK | Freq: Every day | ORAL | Status: DC | PRN

## 2019-12-31 MED ORDER — SODIUM CHLORIDE 0.9% FLUSH
3.0000 mL | Freq: Two times a day (BID) | INTRAVENOUS | Status: DC
  Administered 2020-01-01 – 2020-01-31 (×35): 3 mL via INTRAVENOUS

## 2019-12-31 MED ORDER — POTASSIUM CHLORIDE 10 MEQ/100ML IV SOLN: 10.0000 meq | INTRAVENOUS | Status: DC

## 2019-12-31 MED ORDER — POTASSIUM CHLORIDE 10 MEQ/100ML IV SOLN
10.0000 meq | INTRAVENOUS | Status: AC
  Administered 2020-01-01 (×3): 10 meq via INTRAVENOUS
  Filled 2019-12-31 (×3): qty 100

## 2019-12-31 MED ORDER — SODIUM CHLORIDE 0.9% FLUSH
3.0000 mL | INTRAVENOUS | Status: DC | PRN
  Administered 2020-01-28 – 2020-01-29 (×2): 3 mL via INTRAVENOUS

## 2019-12-31 MED ORDER — ATENOLOL 25 MG PO TABS
25.0000 mg | ORAL_TABLET | Freq: Every day | ORAL | Status: DC
  Administered 2020-01-01 – 2020-01-21 (×21): 25 mg via ORAL
  Filled 2019-12-31 (×22): qty 1

## 2019-12-31 MED ORDER — POTASSIUM CHLORIDE CRYS ER 20 MEQ PO TBCR: 20.0000 meq | EXTENDED_RELEASE_TABLET | Freq: Every day | ORAL | Status: DC

## 2019-12-31 MED ORDER — ACETAMINOPHEN 650 MG RE SUPP: 650.0000 mg | Freq: Four times a day (QID) | RECTAL | Status: DC | PRN

## 2019-12-31 MED ORDER — INSULIN ASPART 100 UNIT/ML ~~LOC~~ SOLN
0.0000 [IU] | Freq: Every day | SUBCUTANEOUS | Status: DC
  Administered 2020-01-01: 2 [IU] via SUBCUTANEOUS

## 2019-12-31 MED ORDER — SODIUM CHLORIDE 0.9 % IV SOLN
250.0000 mL | INTRAVENOUS | Status: DC | PRN
  Administered 2020-01-20: 250 mL via INTRAVENOUS

## 2019-12-31 MED ORDER — ENOXAPARIN SODIUM 40 MG/0.4ML ~~LOC~~ SOLN
40.0000 mg | Freq: Every day | SUBCUTANEOUS | Status: DC
  Administered 2020-01-01: 40 mg via SUBCUTANEOUS
  Filled 2019-12-31: qty 0.4

## 2019-12-31 MED ORDER — INSULIN ASPART 100 UNIT/ML ~~LOC~~ SOLN
0.0000 [IU] | Freq: Three times a day (TID) | SUBCUTANEOUS | Status: DC
  Administered 2020-01-01 (×3): 2 [IU] via SUBCUTANEOUS
  Administered 2020-01-03: 1 [IU] via SUBCUTANEOUS
  Administered 2020-01-05: 2 [IU] via SUBCUTANEOUS
  Administered 2020-01-08: 1 [IU] via SUBCUTANEOUS
  Administered 2020-01-11 – 2020-01-13 (×5): 2 [IU] via SUBCUTANEOUS
  Administered 2020-01-13: 1 [IU] via SUBCUTANEOUS
  Administered 2020-01-13: 2 [IU] via SUBCUTANEOUS
  Administered 2020-01-14: 1 [IU] via SUBCUTANEOUS
  Administered 2020-01-14: 2 [IU] via SUBCUTANEOUS
  Administered 2020-01-17: 1 [IU] via SUBCUTANEOUS
  Administered 2020-01-17 (×2): 2 [IU] via SUBCUTANEOUS
  Administered 2020-01-18 – 2020-01-19 (×2): 1 [IU] via SUBCUTANEOUS
  Administered 2020-01-19: 2 [IU] via SUBCUTANEOUS
  Administered 2020-01-20 – 2020-01-21 (×4): 1 [IU] via SUBCUTANEOUS
  Administered 2020-01-21: 3 [IU] via SUBCUTANEOUS
  Administered 2020-01-22 (×2): 2 [IU] via SUBCUTANEOUS
  Administered 2020-01-23: 1 [IU] via SUBCUTANEOUS

## 2019-12-31 MED ORDER — BENAZEPRIL HCL 40 MG PO TABS
40.0000 mg | ORAL_TABLET | Freq: Every day | ORAL | Status: DC
  Administered 2020-01-01 – 2020-01-03 (×4): 40 mg via ORAL
  Filled 2019-12-31 (×5): qty 1

## 2019-12-31 MED ORDER — ACETAMINOPHEN 325 MG PO TABS
650.0000 mg | ORAL_TABLET | Freq: Four times a day (QID) | ORAL | Status: DC | PRN
  Administered 2020-01-04 – 2020-01-13 (×3): 650 mg via ORAL
  Filled 2019-12-31 (×5): qty 2

## 2019-12-31 MED ORDER — HYDRALAZINE HCL 50 MG PO TABS
50.0000 mg | ORAL_TABLET | Freq: Four times a day (QID) | ORAL | Status: DC | PRN
  Administered 2020-01-08 – 2020-01-21 (×2): 50 mg via ORAL
  Filled 2019-12-31 (×3): qty 1

## 2019-12-31 MED ORDER — POTASSIUM CHLORIDE CRYS ER 20 MEQ PO TBCR
40.0000 meq | EXTENDED_RELEASE_TABLET | Freq: Once | ORAL | Status: AC
  Administered 2020-01-01: 40 meq via ORAL
  Filled 2019-12-31: qty 2

## 2019-12-31 NOTE — ED Triage Notes (Signed)
Pt arrives to ED via city bus with a chief complaint of generalized body aches for the last 2-3 days. He denies any shortness of breath no chest pain. Denies any known fevers.

## 2019-12-31 NOTE — H&P (Signed)
History and Physical    [name] TKZ:[id number] DOB: 20-Apr-1960 DOA: 12/31/2019  PCP: [name], NP   Patient coming from: Home   Chief Complaint: Aches   HPI: [name] is a 59 y.o. male with medical history significant for hypertension, poorly controlled diabetes mellitus, chronic leukopenia, history of CVA, and history of substance abuse, now presenting to the emergency department for evaluation of aches.  Patient reports that he recently moved to the area from [place] to be closer to family, has not taken any of his medicines in a couple days at least, and has developed cramping pain mainly in his legs.  He denies any recent substance abuse, denies any alcohol use, denies fevers, chills, or chest pain, but presents for evaluation of worsening leg cramps and also hopes to get help with nursing home placement.  ED Course: Upon arrival to the ED, patient is found to be afebrile, saturating well on room air, and hypertensive to 220/120.  EKG features sinus rhythm with LVH.  Chest x-ray is notable for chronic cardiomegaly but no acute findings.  Chemistry panel features a potassium of 2.5 and glucose 252.  CBC with chronic mild leukopenia and slight normocytic anemia.  BNP is slightly elevated.  COVID-19 screening test is negative.  Serum CK is 970.  Patient was given 20 mEq of potassium in the ED.  Review of Systems:  All other systems reviewed and apart from HPI, are negative.  Past Medical History:  Diagnosis Date  . Arthritis   . Bipolar affective ([place])   . CHF (congestive heart failure) ([place])   . Depression   . Diabetes mellitus    uncontrolled   . Hypertension     Past Surgical History:  Procedure Laterality Date  . KNEE SURGERY     b/l knees; pending total knee     Social History:   reports that he has quit smoking. He has never used smokeless tobacco. He reports current drug use. Drug: Cocaine. He reports that he does not drink alcohol.  Allergies    Allergen Reactions  . Other Anaphylaxis, Itching and Swelling    Allergy to GRITS , unknown exactly the specific ingredient he is allergic too    Family History  Problem Relation Age of Onset  . Diabetes Other   . Cancer Mother        uterus; deceased   . Heart attack Mother 32  . Hypertension Other   . Heart attack Father 23  . Stroke Sister        12/2012 age 89 y.o     Prior to Admission medications   Medication Sig Start Date End Date Taking? Authorizing Provider  atenolol (TENORMIN) 25 MG tablet Take 1 tablet (25 mg total) by mouth daily. 12/29/19 01/28/20 Yes [name], NP  benazepril (LOTENSIN) 40 MG tablet Take 40 mg by mouth daily.   Yes [provider]  bismuth subsalicylate (PEPTO BISMOL) 262 MG/15ML suspension Take 30 mLs by mouth every 6 (six) hours as needed for indigestion.   Yes [provider]  diltiazem (CARDIZEM CD) 180 MG 24 hr capsule Take 1 capsule (180 mg total) by mouth daily. 12/29/19  Yes [name], NP  furosemide (LASIX) 40 MG tablet Take 1 tablet (40 mg total) by mouth 2 (two) times daily. 12/29/19 01/28/20 Yes [name], NP  gabapentin (NEURONTIN) 800 MG tablet Take 800 mg by mouth 2 (two) times daily.   Yes [provider]  ibuprofen (ADVIL) 200 MG tablet Take 200 mg by mouth every 6 (six) hours as needed for moderate pain.   Yes [provider]  insulin glargine (LANTUS SOLOSTAR) 100 UNIT/ML Solostar Pen Inject 40 Units into the skin at bedtime. 12/29/19  Yes [name], NP  potassium chloride SA (KLOR-CON) 20 MEQ tablet Take 1 tablet (20 mEq total) by mouth daily. 12/29/19  Yes [name], NP  sitaGLIPtin-metformin (JANUMET) 50-1000 MG tablet Take 1 tablet by mouth 2 (two) times daily with a meal. 12/29/19  Yes [name], NP  amLODipine (NORVASC) 10 MG tablet Take 10 mg by mouth daily.  Patient not taking: Reported on 12/29/2019 12/08/17 12/29/19  [provider]    Physical Exam: Vitals:   12/31/19 1355 12/31/19 1804  BP: (!) 219/121 (!) 202/116  Pulse: 84 84  Resp: 20 18  Temp: 98 F (36.7 C) 97.8 F (36.6 C)  TempSrc: Oral Oral  SpO2: 100% 100%  Weight: 110.7 kg   Height: 5\' 8"  (1.727 m)     Constitutional: NAD, calm  Eyes: PERTLA, lids and conjunctivae normal ENMT: Mucous membranes are moist. Posterior pharynx clear of any exudate or lesions.   Neck: normal, supple, no masses, no thyromegaly Respiratory: no wheezing, no crackles. No accessory muscle use.  Cardiovascular: S1 & S2 heard, regular rate and rhythm. Bilateral LE pitting to the hips.  Abdomen: No distension, no tenderness, soft. Bowel sounds active.  Musculoskeletal: no clubbing / cyanosis. No joint deformity upper and lower extremities.   Skin: no significant rashes, lesions, ulcers. Warm, dry, well-perfused. Neurologic: No gross facial asymmetry. Sensation intact. Moving all extremities.  Psychiatric: Alert and oriented to person, place, and situation. Calm and cooperative.    Labs and Imaging on Admission: I have personally reviewed following labs and imaging studies  CBC: Recent Labs  Lab 12/25/19 1635 12/31/19 2122  WBC 3.6* 3.5*  NEUTROABS 1.9 1.8  HGB 11.6* 11.0*  HCT 37.0* 34.6*  MCV 82.0 81.4  PLT 229 716   Basic Metabolic Panel: Recent Labs  Lab 12/25/19 1635 12/31/19 2122  NA 137 136  K 3.2* 2.5*  CL 101 102  CO2 25 24  GLUCOSE 432* 252*  BUN 20 10  CREATININE 1.28* 1.07  CALCIUM 8.8* 8.4*   GFR: Estimated Creatinine Clearance: 90.8 mL/min (by C-G formula based on SCr of 1.07 mg/dL). Liver Function Tests: Recent Labs  Lab 12/25/19 1635 12/31/19 2122  AST 21 28  ALT 17 18  ALKPHOS 96 90  BILITOT 0.5 0.6  PROT 6.9 5.8*  ALBUMIN 2.7* 2.2*   Recent Labs  Lab 12/25/19 1635  LIPASE 86*   No results for input(s): AMMONIA in the last 168 hours. Coagulation Profile: No results for input(s): INR, PROTIME in the last 168  hours. Cardiac Enzymes: Recent Labs  Lab 12/31/19 2122  CKTOTAL 970*   BNP (last 3 results) No results for input(s): PROBNP in the last 8760 hours. HbA1C: Recent Labs    12/29/19 1437  HGBA1C 10.6*   CBG: Recent Labs  Lab 12/25/19 1607 12/31/19 2130  GLUCAP 463* 213*   Lipid Profile: No results for input(s): CHOL, HDL, LDLCALC, TRIG, CHOLHDL, LDLDIRECT in the last 72 hours. Thyroid Function Tests: No results for input(s): TSH, T4TOTAL, FREET4, T3FREE, THYROIDAB in the last 72 hours. Anemia Panel: No results for input(s): VITAMINB12, FOLATE, FERRITIN, TIBC, IRON, RETICCTPCT in the last 72 hours. Urine analysis:    Component Value Date/Time   COLORURINE YELLOW  01/01/2018 [place] 01/01/2018 0653   APPEARANCEUR Clear 12/31/2013 2116   LABSPEC 1.015 01/01/2018 0653   LABSPEC 1.032 12/31/2013 2116   PHURINE 6.0 01/01/2018 0653   GLUCOSEU >=500 (A) 01/01/2018 0653   GLUCOSEU >=500 12/31/2013 2116   HGBUR SMALL (A) 01/01/2018 0653   BILIRUBINUR NEGATIVE 01/01/2018 0653   BILIRUBINUR Negative 12/31/2013 2116   KETONESUR NEGATIVE 01/01/2018 0653   PROTEINUR 100 (A) 01/01/2018 0653   UROBILINOGEN 1.0 08/28/2013 1709   NITRITE NEGATIVE 01/01/2018 0653   LEUKOCYTESUR NEGATIVE 01/01/2018 0653   LEUKOCYTESUR Negative 12/31/2013 2116   Sepsis Labs: @LABRCNTIP (procalcitonin:4,lacticidven:4) ) Recent Results (from the past 240 hour(s))  Respiratory Panel by RT PCR (Flu A&B, Covid) - Nasopharyngeal Swab     Status: None   Collection Time: 12/31/19  2:00 PM   Specimen: Nasopharyngeal Swab  Result Value Ref Range Status   SARS Coronavirus 2 by RT PCR NEGATIVE NEGATIVE Final    Comment: (NOTE) SARS-CoV-2 target nucleic acids are NOT DETECTED.  The SARS-CoV-2 RNA is generally detectable in upper respiratoy specimens during the acute phase of infection. The lowest concentration of SARS-CoV-2 viral copies this assay can detect is 131 copies/mL. A negative  result does not preclude SARS-Cov-2 infection and should not be used as the sole basis for treatment or other patient management decisions. A negative result may occur with  improper specimen collection/handling, submission of specimen other than nasopharyngeal swab, presence of viral mutation(s) within the areas targeted by this assay, and inadequate number of viral copies (<131 copies/mL). A negative result must be combined with clinical observations, patient history, and epidemiological information. The expected result is Negative.  Fact Sheet for Patients:  PinkCheek.be  Fact Sheet for Healthcare Providers:  GravelBags.it  This test is no t yet approved or cleared by the [place] FDA and  has been authorized for detection and/or diagnosis of SARS-CoV-2 by FDA under an Emergency Use Authorization (EUA). This EUA will remain  in effect (meaning this test can be used) for the duration of the COVID-19 declaration under Section 564(b)(1) of the Act, 21 U.S.C. section 360bbb-3(b)(1), unless the authorization is terminated or revoked sooner.     Influenza A by PCR NEGATIVE NEGATIVE Final   Influenza B by PCR NEGATIVE NEGATIVE Final    Comment: (NOTE) The Xpert Xpress SARS-CoV-2/FLU/RSV assay is intended as an aid in  the diagnosis of influenza from Nasopharyngeal swab specimens and  should not be used as a sole basis for treatment. Nasal washings and  aspirates are unacceptable for Xpert Xpress SARS-CoV-2/FLU/RSV  testing.  Fact Sheet for Patients: PinkCheek.be  Fact Sheet for Healthcare Providers: GravelBags.it  This test is not yet approved or cleared by the [place] FDA and  has been authorized for detection and/or diagnosis of SARS-CoV-2 by  FDA under an Emergency Use Authorization (EUA). This EUA will remain  in effect (meaning this test can be used)  for the duration of the  Covid-19 declaration under Section 564(b)(1) of the Act, 21  U.S.C. section 360bbb-3(b)(1), unless the authorization is  terminated or revoked. Performed at [facility], [place] [address] [place], [place] [address]      Radiological Exams on Admission: DG Chest 2 View  Result Date: 12/31/2019 CLINICAL DATA:  Shortness of breath.  Generalized body aches. EXAM: CHEST - 2 VIEW COMPARISON:  Radiograph 12/25/2019.  CT 12/12/2019 FINDINGS: Improved lung volumes from prior exam. Chronic cardiomegaly. No pulmonary edema. No focal airspace disease. Trace fluid  in the fissures without significant sub pulmonic effusion. No confluent consolidation. No pneumothorax. No acute osseous abnormalities are seen. IMPRESSION: Chronic cardiomegaly. No acute chest findings. Electronically Signed   By: Keith Rake M.D.   On: 12/31/2019 20:43    EKG: Independently reviewed. Sinus rhythm, LVH.   Assessment/Plan   1. Hypertensive urgency  - BP as high as 220/120 in ED in setting of medication non-compliance  - He is asymptomatic  - Administer his home medications now, and then use as-needed hydralazine with short-term goal of 170/90  - Potassium is critically-low, will check serum aldosterone and renin    2. Hypokalemia  - Serum potassium is 2.5 on admission with leg cramps and elevated CK  - Replaced with 20 mEq IV potassium in ED  - Give oral and additional IV potassium, empiric mag, and repeat chemistries in am    3. Chronic diastolic CHF  - He is hypervolemic on admission with marked LE edema but no respiratory sxs or pulm edema  - SLIV, hold Lasix initially given critically-low potassium, monitor volume status   4. Insulin-dependent DM  - A1c was 10.6% this month  - Continue CBG checks and insulin    5. Leukopenia  - Appears to be chronic and stable, no evidence for acute infection  - Check HIV, monitor     DVT prophylaxis: Lovenox  Code Status: Full  Family  Communication: Discussed with patient  Disposition Plan:  Patient is from: Home  Anticipated d/c is to: TBD Anticipated d/c date is: 01/01/20 Patient currently: Pending improvement in BP and electrolytes, PT eval  Consults called: none  Admission status: Observation     [name], MD [facility]  12/31/2019, 11:04 PM

## 2019-12-31 NOTE — ED Provider Notes (Signed)
[facility] EMERGENCY DEPARTMENT Provider Note   CSN: [id number] Arrival date & time: 12/31/19  1352     History Chief Complaint  Patient presents with  . Generalized Body Aches    [name] is a 59 y.o. male presenting for evaluation of generalized body aches and weakness.  Patient states he is having weakness which is preventing him from performing his ADLs.  He reports generalized body aches which began several days ago.  He recently moved from [place] to live with family, but still feels he is not getting the support he needs at home.  He denies fevers, chills, chest pain, cough, abdominal pain, abnormal bowel movements.  He does report urinary frequency, however is also on Lasix.  No dysuria.  He reports shortness of breath, which is worse with exertion.  This is constant, not improving or worsening.  Additional history taken chart review.  Patient with a history of CHF, diabetes, hypertension, depression, bipolar.  HPI     Past Medical History:  Diagnosis Date  . Arthritis   . Bipolar affective ([place])   . CHF (congestive heart failure) ([place])   . Depression   . Diabetes mellitus    uncontrolled   . Hypertension     Patient Active Problem List   Diagnosis Date Noted  . Hypertensive urgency 12/31/2019  . Hypokalemia 12/31/2019  . Chronic diastolic CHF (congestive heart failure) ([place]) 12/31/2017  . Poorly controlled diabetes mellitus ([place])   . Pure hypercholesterolemia   . Hyperglycemia 09/28/2017  . Nonobstructive atherosclerosis of coronary artery 09/28/2017  . MDD (major depressive disorder), recurrent episode, severe ([place]) 07/30/2017  . Osteoarthritis 04/12/2015  . Severe recurrent major depression with psychotic features ([place]) 01/04/2015  . Substance-related disorder ([place]) 10/30/2014  . Patient's noncompliance with other medical treatment and regimen 06/07/2014  . Cocaine dependence, uncomplicated ([place]) 08/67/6195  . Chronic pain  05/31/2014  . Cocaine abuse ([place]) 05/31/2014  . History of noncompliance with medical treatment 05/31/2014  . Malingering 05/31/2014  . Leukopenia 05/31/2014  . Diabetic neuropathy associated with diabetes mellitus due to underlying condition ([place]) 05/31/2014  . Diabetic neuropathy ([place]) 05/31/2014  . Congestive heart failure with left ventricular systolic dysfunction ([place]) 02/22/2014  . Depression, major, recurrent, severe with psychosis ([place]) 08/29/2013  . Suicidal ideations 08/28/2013  . Uncontrolled diabetes mellitus ([place]) 06/30/2013  . Atypical chest pain 12/09/2011  . Episodic mood disorder ([place]) 05/13/2011    Past Surgical History:  Procedure Laterality Date  . KNEE SURGERY     b/l knees; pending total knee        Family History  Problem Relation Age of Onset  . Diabetes Other   . Cancer Mother        uterus; deceased   . Heart attack Mother 50  . Hypertension Other   . Heart attack Father 42  . Stroke Sister        12/2012 age 5 y.o    Social History   Tobacco Use  . Smoking status: Former Research scientist (life sciences)  . Smokeless tobacco: Never Used  Vaping Use  . Vaping Use: Never used  Substance Use Topics  . Alcohol use: No  . Drug use: Yes    Types: Cocaine    Home Medications Prior to Admission medications   Medication Sig Start Date End Date Taking? Authorizing Provider  atenolol (TENORMIN) 25 MG tablet Take 1 tablet (25 mg total) by mouth daily. 12/29/19 01/28/20 Yes [name], NP  benazepril (LOTENSIN) 40  MG tablet Take 40 mg by mouth daily.   Yes [provider]  bismuth subsalicylate (PEPTO BISMOL) 262 MG/15ML suspension Take 30 mLs by mouth every 6 (six) hours as needed for indigestion.   Yes [provider]  diltiazem (CARDIZEM CD) 180 MG 24 hr capsule Take 1 capsule (180 mg total) by mouth daily. 12/29/19  Yes [name], NP  furosemide (LASIX) 40 MG tablet Take 1 tablet (40 mg total) by mouth 2 (two) times daily. 12/29/19  01/28/20 Yes [name], NP  gabapentin (NEURONTIN) 800 MG tablet Take 800 mg by mouth 2 (two) times daily.   Yes [provider]  ibuprofen (ADVIL) 200 MG tablet Take 200 mg by mouth every 6 (six) hours as needed for moderate pain.   Yes [provider]  insulin glargine (LANTUS SOLOSTAR) 100 UNIT/ML Solostar Pen Inject 40 Units into the skin at bedtime. 12/29/19  Yes [name], NP  potassium chloride SA (KLOR-CON) 20 MEQ tablet Take 1 tablet (20 mEq total) by mouth daily. 12/29/19  Yes [name], NP  sitaGLIPtin-metformin (JANUMET) 50-1000 MG tablet Take 1 tablet by mouth 2 (two) times daily with a meal. 12/29/19  Yes [name], NP  amLODipine (NORVASC) 10 MG tablet Take 10 mg by mouth daily.  Patient not taking: Reported on 12/29/2019 12/08/17 12/29/19  [provider]    Allergies    Other  Review of Systems   Review of Systems  Respiratory: Positive for shortness of breath (Chronic).   Cardiovascular: Positive for leg swelling (Chronic).  Musculoskeletal: Positive for myalgias.  Neurological: Positive for weakness.  All other systems reviewed and are negative.   Physical Exam Updated Vital Signs BP (!) 202/116 (BP Location: Right Arm)   Pulse 84   Temp 97.8 F (36.6 C) (Oral)   Resp 18   Ht 5\' 8"  (1.727 m)   Wt 110.7 kg   SpO2 100%   BMI 37.10 kg/m   Physical Exam Vitals and nursing note reviewed.  Constitutional:      General: He is not in acute distress.    Appearance: He is well-developed.     Comments: Appears chronically ill, older than stated age  HENT:     Head: Normocephalic and atraumatic.  Eyes:     Conjunctiva/sclera: Conjunctivae normal.     Pupils: Pupils are equal, round, and reactive to light.  Cardiovascular:     Rate and Rhythm: Normal rate and regular rhythm.     Pulses: Normal pulses.  Pulmonary:     Effort: Pulmonary effort is normal. No respiratory distress.     Breath sounds:  Normal breath sounds. No wheezing.     Comments: Speaking in short sentences.  Mildly tachypneic. Abdominal:     General: There is no distension.     Palpations: Abdomen is soft. There is no mass.     Tenderness: There is no abdominal tenderness. There is no guarding or rebound.  Musculoskeletal:        General: Normal range of motion.     Cervical back: Normal range of motion and neck supple.     Right lower leg: Edema present.     Left lower leg: Edema present.     Comments: Bilateral leg swelling.   Skin:    General: Skin is warm and dry.     Capillary Refill: Capillary refill takes less than 2 seconds.  Neurological:     Mental Status: He is alert and  oriented to person, place, and time.     ED Results / Procedures / Treatments   Labs (all labs ordered are listed, but only abnormal results are displayed) Labs Reviewed  CBC WITH DIFFERENTIAL/PLATELET - Abnormal; Notable for the following components:      Result Value   WBC 3.5 (*)    Hemoglobin 11.0 (*)    HCT 34.6 (*)    MCH 25.9 (*)    All other components within normal limits  COMPREHENSIVE METABOLIC PANEL - Abnormal; Notable for the following components:   Potassium 2.5 (*)    Glucose, Bld 252 (*)    Calcium 8.4 (*)    Total Protein 5.8 (*)    Albumin 2.2 (*)    All other components within normal limits  CK - Abnormal; Notable for the following components:   Total CK 970 (*)    All other components within normal limits  BRAIN NATRIURETIC PEPTIDE - Abnormal; Notable for the following components:   B Natriuretic Peptide 116.8 (*)    All other components within normal limits  CBG MONITORING, ED - Abnormal; Notable for the following components:   Glucose-Capillary 213 (*)    All other components within normal limits  RESPIRATORY PANEL BY RT PCR (FLU A&B, COVID)  MAGNESIUM  ALDOSTERONE + RENIN ACTIVITY W/ RATIO    EKG None  Radiology DG Chest 2 View  Result Date: 12/31/2019 CLINICAL DATA:  Shortness of  breath.  Generalized body aches. EXAM: CHEST - 2 VIEW COMPARISON:  Radiograph 12/25/2019.  CT 12/12/2019 FINDINGS: Improved lung volumes from prior exam. Chronic cardiomegaly. No pulmonary edema. No focal airspace disease. Trace fluid in the fissures without significant sub pulmonic effusion. No confluent consolidation. No pneumothorax. No acute osseous abnormalities are seen. IMPRESSION: Chronic cardiomegaly. No acute chest findings. Electronically Signed   By: Keith Rake M.D.   On: 12/31/2019 20:43    Procedures Procedures (including critical care time)  Medications Ordered in ED Medications  potassium chloride 10 mEq in 100 mL IVPB (has no administration in time range)  potassium chloride SA (KLOR-CON) CR tablet 40 mEq (has no administration in time range)  magnesium sulfate IVPB 1 g 100 mL (has no administration in time range)    ED Course  I have reviewed the triage vital signs and the nursing notes.  Pertinent labs & imaging results that were available during my care of the patient were reviewed by me and considered in my medical decision making (see chart for details).    MDM Rules/Calculators/A&P                          Pt presenting for evalaution of weakness and generalized body aches.  On exam, patient appears chronically ill.  Initially, patient is extremely concerned about his living situation and ability to care for himself.  Due to his significant medical history, will obtain labs to look for anemia, electrolyte stability, acute heart failure exacerbation, infection. cxr to assess chf.   Chest x-ray viewed interpreted by me, consistent with chronic cardiomegaly.  No acute effusions or pneumonia.  Labs interpreted by me, shows electrolyte abnormalities.  Potassium of 2.5.  Will order EKG.  Patient has been taking potassium at home, concerned about his downtrending potassium despite outpatient supplementation.  In the setting of weakness, he may need to be admitted for  electrolyte management.  Additionally, CK mildly elevated, however due to his CHF, he will need gentle rehydration.  Discussed  with attending, Dr. Sherry Ruffing agrees to plan.  Will call for admission.  Discussed with Dr. Myna Hidalgo from triad hospitalist service, patient to be admitted.  Final Clinical Impression(s) / ED Diagnoses Final diagnoses:  Hypokalemia  Weakness  Elevated CK  Hypertension, unspecified type    Rx / DC Orders ED Discharge Orders    None       Franchot Heidelberg, PA-C 12/31/19 2305    Tegeler, Gwenyth Allegra, MD 12/31/19 [phone]

## 2019-12-31 NOTE — NC FL2 (Signed)
[place] LEVEL OF CARE SCREENING TOOL     IDENTIFICATION  Patient Name: [name] Birthdate: 1960/05/01 Sex: male Admission Date (Current Location): 12/31/2019  [facility] and [place] Number:  Herbalist and Address:  [facility], [place] [address] [place], [place] [address]      Provider Number: [id number]  Attending Physician Name and Address:  Tegeler, Gwenyth Allegra, *  Relative Name and Phone Number:  Atreyu, Mak   [phone]    Current Level of Care: Hospital Recommended Level of Care: [place] Prior Approval Number:    Date Approved/Denied:   PASRR Number: [id number]  Discharge Plan: SNF    Current Diagnoses: Patient Active Problem List   Diagnosis Date Noted  . Acute on chronic diastolic CHF (congestive heart failure) ([place]) 12/31/2017  . Poorly controlled diabetes mellitus ([place])   . Pure hypercholesterolemia   . Hyperglycemia 09/28/2017  . Nonobstructive atherosclerosis of coronary artery 09/28/2017  . Chronic HFrEF (heart failure with reduced ejection fraction) ([place]) 09/28/2017  . MDD (major depressive disorder), recurrent episode, severe ([place]) 07/30/2017  . Osteoarthritis 04/12/2015  . Severe recurrent major depression with psychotic features ([place]) 01/04/2015  . Substance-related disorder ([place]) 10/30/2014  . Patient's noncompliance with other medical treatment and regimen 06/07/2014  . Cocaine dependence, uncomplicated ([place]) 28/31/5176  . Chronic pain 05/31/2014  . Cocaine abuse ([place]) 05/31/2014  . History of noncompliance with medical treatment 05/31/2014  . Malingering 05/31/2014  . Pancytopenia ([place]) 05/31/2014  . Diabetic neuropathy associated with diabetes mellitus due to underlying condition ([place]) 05/31/2014  . Diabetic neuropathy ([place]) 05/31/2014  . Congestive heart failure with left ventricular systolic dysfunction ([place]) 02/22/2014  . Depression, major, recurrent, severe with  psychosis ([place]) 08/29/2013  . Suicidal ideations 08/28/2013  . Uncontrolled diabetes mellitus ([place]) 06/30/2013  . Atypical chest pain 12/09/2011  . HTN (hypertension), benign 12/09/2011  . Episodic mood disorder ([place]) 05/13/2011    Orientation RESPIRATION BLADDER Height & Weight     Self, Time, Situation, Place  Normal Continent Weight: 244 lb (110.7 kg) Height:  5\' 8"  (172.7 cm)  BEHAVIORAL SYMPTOMS/MOOD NEUROLOGICAL BOWEL NUTRITION STATUS      Continent Diet (heart healthy)  AMBULATORY STATUS COMMUNICATION OF NEEDS Skin   Extensive Assist Verbally Normal                       Personal Care Assistance Level of Assistance  Bathing, Feeding, Dressing Bathing Assistance: Limited assistance Feeding assistance: Independent Dressing Assistance: Limited assistance     Functional Limitations Info  Sight, Speech, Hearing Sight Info: Adequate Hearing Info: Adequate Speech Info: Adequate    SPECIAL CARE FACTORS FREQUENCY  PT (By licensed PT), OT (By licensed OT)     PT Frequency: 5x weekly OT Frequency: 5x weekly            Contractures Contractures Info: Not present    Additional Factors Info  Code Status, Allergies Code Status Info: Full Allergies Info: Grits           Current Medications (12/31/2019):  This is the current hospital active medication list No current facility-administered medications for this encounter.   Current Outpatient Medications  Medication Sig Dispense Refill  . atenolol (TENORMIN) 25 MG tablet Take 1 tablet (25 mg total) by mouth daily. 90 tablet 0  . benazepril (LOTENSIN) 40 MG tablet Take 1 tablet (40 mg total) by mouth daily. 90 tablet 0  . diltiazem (CARDIZEM  CD) 180 MG 24 hr capsule Take 1 capsule (180 mg total) by mouth daily. 90 capsule 1  . furosemide (LASIX) 40 MG tablet Take 1 tablet (40 mg total) by mouth 2 (two) times daily. 60 tablet 0  . gabapentin (NEURONTIN) 400 MG capsule Take 400 mg by mouth 2 (two) times daily.      . insulin glargine (LANTUS SOLOSTAR) 100 UNIT/ML Solostar Pen Inject 40 Units into the skin at bedtime. 15 mL 0  . potassium chloride SA (KLOR-CON) 20 MEQ tablet Take 1 tablet (20 mEq total) by mouth daily. 30 tablet 3  . sitaGLIPtin-metformin (JANUMET) 50-1000 MG tablet Take 1 tablet by mouth 2 (two) times daily with a meal. 180 tablet 1     Discharge Medications: Please see discharge summary for a list of discharge medications.  Relevant Imaging Results:  Relevant Lab Results:   Additional Information SSN#[id number]  Vergie Living, LCSW

## 2019-12-31 NOTE — Progress Notes (Signed)
   12/31/19 1949  TOC ED Mini Assessment  PING Used in TOC Assessment Yes  Admission or Readmission Diverted Yes  Interventions which prevented an admission or readmission [place] or Services  What brought you to the Emergency Department?  c/o generalized body aches  Barriers to Discharge  (As per last notes patient has been accepted by [facility])  Means of departure Public Transportation  Choice offered to / list presented to  Patient  ED CM met with patient in [place] 20, Patient reports falling several times per day. Patient states that SW from [facility] recommended that he return to hospital because he is not able to care for self. SW Debbie from [facility]  called and stated patient is not safe at home and needs a higher level of care.  CM discussed with EDP PT /OT consult placed, PT will evalu- ate tomorrow. ED CSW also update and will continue follow for transitional needs

## 2019-12-31 NOTE — Progress Notes (Addendum)
CSW met with Pt at bedside. Completed TOC assessment. Pt already has PASRR # [id number]

## 2019-12-31 NOTE — Telephone Encounter (Signed)
Copied from [place] [phone]. Topic: Referral - Status >> Dec 31, 2019 11:48 AM Erick Blinks wrote: [place] calling from [facility] is requesting a call back, has questions regarding a referral.

## 2020-01-01 ENCOUNTER — Encounter (HOSPITAL_COMMUNITY): Payer: Self-pay | Admitting: Family Medicine

## 2020-01-01 ENCOUNTER — Observation Stay (HOSPITAL_BASED_OUTPATIENT_CLINIC_OR_DEPARTMENT_OTHER): Payer: Medicare HMO

## 2020-01-01 DIAGNOSIS — I361 Nonrheumatic tricuspid (valve) insufficiency: Secondary | ICD-10-CM | POA: Diagnosis not present

## 2020-01-01 DIAGNOSIS — D649 Anemia, unspecified: Secondary | ICD-10-CM | POA: Diagnosis present

## 2020-01-01 DIAGNOSIS — I5032 Chronic diastolic (congestive) heart failure: Secondary | ICD-10-CM

## 2020-01-01 DIAGNOSIS — I340 Nonrheumatic mitral (valve) insufficiency: Secondary | ICD-10-CM

## 2020-01-01 DIAGNOSIS — I160 Hypertensive urgency: Secondary | ICD-10-CM

## 2020-01-01 DIAGNOSIS — I5189 Other ill-defined heart diseases: Secondary | ICD-10-CM

## 2020-01-01 DIAGNOSIS — D509 Iron deficiency anemia, unspecified: Secondary | ICD-10-CM | POA: Diagnosis present

## 2020-01-01 DIAGNOSIS — M6282 Rhabdomyolysis: Secondary | ICD-10-CM

## 2020-01-01 DIAGNOSIS — R52 Pain, unspecified: Secondary | ICD-10-CM | POA: Diagnosis present

## 2020-01-01 DIAGNOSIS — I350 Nonrheumatic aortic (valve) stenosis: Secondary | ICD-10-CM | POA: Diagnosis not present

## 2020-01-01 HISTORY — DX: Rhabdomyolysis: M62.82

## 2020-01-01 LAB — CBC WITH DIFFERENTIAL/PLATELET
Abs Immature Granulocytes: 0.02 10*3/uL (ref 0.00–0.07)
Basophils Absolute: 0 10*3/uL (ref 0.0–0.1)
Basophils Relative: 1 %
Eosinophils Absolute: 0.1 10*3/uL (ref 0.0–0.5)
Eosinophils Relative: 4 %
HCT: 31.7 % — ABNORMAL LOW (ref 39.0–52.0)
Hemoglobin: 9.9 g/dL — ABNORMAL LOW (ref 13.0–17.0)
Immature Granulocytes: 1 %
Lymphocytes Relative: 32 %
Lymphs Abs: 1.2 10*3/uL (ref 0.7–4.0)
MCH: 25.3 pg — ABNORMAL LOW (ref 26.0–34.0)
MCHC: 31.2 g/dL (ref 30.0–36.0)
MCV: 80.9 fL (ref 80.0–100.0)
Monocytes Absolute: 0.5 10*3/uL (ref 0.1–1.0)
Monocytes Relative: 14 %
Neutro Abs: 1.8 10*3/uL (ref 1.7–7.7)
Neutrophils Relative %: 48 %
Platelets: 254 10*3/uL (ref 150–400)
RBC: 3.92 MIL/uL — ABNORMAL LOW (ref 4.22–5.81)
RDW: 14.4 % (ref 11.5–15.5)
WBC: 3.7 10*3/uL — ABNORMAL LOW (ref 4.0–10.5)
nRBC: 0 % (ref 0.0–0.2)

## 2020-01-01 LAB — URINALYSIS, ROUTINE W REFLEX MICROSCOPIC
Bacteria, UA: NONE SEEN
Bilirubin Urine: NEGATIVE
Glucose, UA: >=500 mg/dL — AB
Ketones, ur: 5 mg/dL — AB
Leukocytes,Ua: NEGATIVE
Nitrite: NEGATIVE
Protein, ur: >=300 mg/dL — AB
Specific Gravity, Urine: 1.025 (ref 1.005–1.030)
pH: 5 (ref 5.0–8.0)

## 2020-01-01 LAB — IRON AND TIBC
Iron: 32 ug/dL — ABNORMAL LOW (ref 45–182)
Saturation Ratios: 14 % — ABNORMAL LOW (ref 17.9–39.5)
TIBC: 237 ug/dL — ABNORMAL LOW (ref 250–450)
UIBC: 205 ug/dL

## 2020-01-01 LAB — RETICULOCYTES
Immature Retic Fract: 17.6 % — ABNORMAL HIGH (ref 2.3–15.9)
RBC.: 3.82 MIL/uL — ABNORMAL LOW (ref 4.22–5.81)
Retic Count, Absolute: 60.4 10*3/uL (ref 19.0–186.0)
Retic Ct Pct: 1.6 % (ref 0.4–3.1)

## 2020-01-01 LAB — ECHOCARDIOGRAM COMPLETE
AR max vel: 1.3 cm2
AV Area VTI: 1.21 cm2
AV Area mean vel: 1.19 cm2
AV Mean grad: 13 mmHg
AV Peak grad: 22.5 mmHg
Ao pk vel: 2.37 m/s
Area-P 1/2: 3.77 cm2
Height: 68 in
S' Lateral: 3.2 cm
Weight: 4504.44 oz

## 2020-01-01 LAB — BASIC METABOLIC PANEL
Anion gap: 9 (ref 5–15)
BUN: 9 mg/dL (ref 6–20)
CO2: 25 mmol/L (ref 22–32)
Calcium: 8 mg/dL — ABNORMAL LOW (ref 8.9–10.3)
Chloride: 104 mmol/L (ref 98–111)
Creatinine, Ser: 1.08 mg/dL (ref 0.61–1.24)
GFR, Estimated: >60 mL/min (ref >60)
Glucose, Bld: 221 mg/dL — ABNORMAL HIGH (ref 70–99)
Potassium: 2.6 mmol/L — CL (ref 3.5–5.1)
Sodium: 138 mmol/L (ref 135–145)

## 2020-01-01 LAB — GLUCOSE, CAPILLARY
Glucose-Capillary: 179 mg/dL — ABNORMAL HIGH (ref 70–99)
Glucose-Capillary: 171 mg/dL — ABNORMAL HIGH (ref 70–99)
Glucose-Capillary: 234 mg/dL — ABNORMAL HIGH (ref 70–99)

## 2020-01-01 LAB — RAPID URINE DRUG SCREEN, HOSP PERFORMED
Amphetamines: NOT DETECTED
Barbiturates: NOT DETECTED
Benzodiazepines: NOT DETECTED
Cocaine: NOT DETECTED
Opiates: NOT DETECTED
Tetrahydrocannabinol: NOT DETECTED

## 2020-01-01 LAB — MAGNESIUM: Magnesium: 1.9 mg/dL (ref 1.7–2.4)

## 2020-01-01 LAB — VITAMIN B12: Vitamin B-12: 201 pg/mL (ref 180–914)

## 2020-01-01 LAB — FOLATE: Folate: 10 ng/mL (ref >5.9)

## 2020-01-01 LAB — TSH: TSH: 1.354 u[IU]/mL (ref 0.350–4.500)

## 2020-01-01 LAB — HIV ANTIBODY (ROUTINE TESTING W REFLEX): HIV Screen 4th Generation wRfx: NONREACTIVE

## 2020-01-01 LAB — CBG MONITORING, ED
Glucose-Capillary: 199 mg/dL — ABNORMAL HIGH (ref 70–99)
Glucose-Capillary: 175 mg/dL — ABNORMAL HIGH (ref 70–99)

## 2020-01-01 LAB — FERRITIN: Ferritin: 107 ng/mL (ref 24–336)

## 2020-01-01 LAB — T4, FREE: Free T4: 1.24 ng/dL — ABNORMAL HIGH (ref 0.61–1.12)

## 2020-01-01 MED ORDER — POTASSIUM CHLORIDE 10 MEQ/100ML IV SOLN
10.0000 meq | INTRAVENOUS | Status: AC
  Administered 2020-01-01 (×3): 10 meq via INTRAVENOUS
  Filled 2020-01-01: qty 100

## 2020-01-01 MED ORDER — MAGNESIUM OXIDE 400 (241.3 MG) MG PO TABS
400.0000 mg | ORAL_TABLET | Freq: Two times a day (BID) | ORAL | Status: AC
  Administered 2020-01-01 – 2020-01-02 (×4): 400 mg via ORAL
  Filled 2020-01-01 (×4): qty 1

## 2020-01-01 MED ORDER — PANTOPRAZOLE SODIUM 40 MG PO TBEC
40.0000 mg | DELAYED_RELEASE_TABLET | Freq: Two times a day (BID) | ORAL | Status: DC
  Administered 2020-01-01: 40 mg via ORAL
  Filled 2020-01-01: qty 1

## 2020-01-01 MED ORDER — ENOXAPARIN SODIUM 80 MG/0.8ML ~~LOC~~ SOLN
65.0000 mg | Freq: Every day | SUBCUTANEOUS | Status: DC
  Administered 2020-01-02 – 2020-01-13 (×12): 65 mg via SUBCUTANEOUS
  Filled 2020-01-01 (×13): qty 0.8

## 2020-01-01 MED ORDER — PANTOPRAZOLE SODIUM 40 MG IV SOLR
40.0000 mg | Freq: Two times a day (BID) | INTRAVENOUS | Status: DC
  Administered 2020-01-01: 40 mg via INTRAVENOUS
  Filled 2020-01-01: qty 40

## 2020-01-01 NOTE — Assessment & Plan Note (Signed)
As reflected by elevated cpk suspect mild rhabdo, nontraumatic.  We will encourage po hydration.

## 2020-01-01 NOTE — Assessment & Plan Note (Signed)
Counseling when he is awake but current uds negative.

## 2020-01-01 NOTE — Assessment & Plan Note (Signed)
Echocardiogram pending.

## 2020-01-01 NOTE — ED Notes (Signed)
SDU Breakfast Ordered 

## 2020-01-01 NOTE — Evaluation (Signed)
Physical Therapy Evaluation Patient Details Name: [name] MRN: [id number] DOB: 1960-08-31 Today's Date: 01/01/2020   History of Present Illness  59 y.o. male with medical history significant for hypertension, poorly controlled diabetes mellitus, chronic leukopenia, history of CVA, and history of substance abuse, now presenting to the emergency department for evaluation of aches. He has not taken any of his medicines in a couple days at least, and has developed cramping pain mainly in his legs. Pt found to be hypertensive and hypokalemic.  Clinical Impression  Pt presents to PT with deficits in functional mobility, balance, strength, power, and endurance. At baseline pt  Requires assistance to perform a stand pivot transfer to his power wheelchair. Once in power wheelchair the pt is able to mobilize independently. Pt is very weak at this time with significant BLE edema. Pt is unable to stand or pivot to wheelchair at this time despite significant assistance from physical therapist. Pt will benefit from continued acute PT services to improve LE strength and mobility quality. Pt will benefit from SNF placement at this time as he has reduced caregiver support at home and is unable to safely transfer at this time.    Follow Up Recommendations SNF;Supervision/Assistance - 24 hour    Equipment Recommendations   (mechanical lift, hospital bed)    Recommendations for Other Services       Precautions / Restrictions Precautions Precautions: Fall Restrictions Weight Bearing Restrictions: No      Mobility  Bed Mobility Overal bed mobility: Needs Assistance Bed Mobility: Supine to Sit;Sit to Supine     Supine to sit: Min guard;HOB elevated Sit to supine: Mod assist   General bed mobility comments: use of rails during all bed mobility, modA to return to supine to manage LEs    Transfers Overall transfer level: Needs assistance   Transfers: Sit to/from Stand Sit to Stand: Max  assist;From elevated surface (unable to complete stand, roughly 25% erect standing positio)         General transfer comment: pt requires significant assistance from PT to clear buttocks a few inches from bed, unable to complete stand  Ambulation/Gait                Stairs            Wheelchair Mobility    Modified Rankin (Stroke Patients Only)       Balance Overall balance assessment: Needs assistance Sitting-balance support: No upper extremity supported;Feet supported Sitting balance-Leahy Scale: Fair     Standing balance support:  (unable to stand)                                 Pertinent Vitals/Pain Pain Assessment: No/denies pain    Home Living Family/patient expects to be discharged to:: Private residence Living Arrangements: Parent;Other relatives Available Help at Discharge: Family;Available PRN/intermittently Type of Home: House Home Access: Stairs to enter Entrance Stairs-Rails: None Entrance Stairs-Number of Steps: 1 Home Layout: One level Home Equipment: Walker - 4 wheels;Cane - single point;Wheelchair - power      Prior Function Level of Independence: Needs assistance   Gait / Transfers Assistance Needed: pt reports he requires assistance of his brother or nephew (2 tall and strong individuals) to assist him in transferring from bed to power wheelchair. Otherwise the pt is stranded.  ADL's / Homemaking Assistance Needed: pt requires assistance for ADLs        Hand Dominance  Extremity/Trunk Assessment   Upper Extremity Assessment Upper Extremity Assessment: RUE deficits/detail;LUE deficits/detail RUE Deficits / Details: grossly 4-/5 RUE Sensation: decreased light touch LUE Sensation: decreased light touch    Lower Extremity Assessment Lower Extremity Assessment: RLE deficits/detail;LLE deficits/detail RLE Deficits / Details: grossly 4-/5, significant BLE edema RLE Sensation: decreased light touch LLE  Deficits / Details: grossly 4/5, significant BLE edema LLE Sensation: decreased light touch    Cervical / Trunk Assessment Cervical / Trunk Assessment: Other exceptions Cervical / Trunk Exceptions: morbid obesity  Communication   Communication: No difficulties  Cognition Arousal/Alertness: Awake/alert Behavior During Therapy: WFL for tasks assessed/performed Overall Cognitive Status: Within Functional Limits for tasks assessed                                        General Comments General comments (skin integrity, edema, etc.): VSS on RA    Exercises     Assessment/Plan    PT Assessment Patient needs continued PT services  PT Problem List Decreased strength;Decreased activity tolerance;Decreased balance;Decreased mobility;Decreased knowledge of use of DME;Decreased safety awareness       PT Treatment Interventions DME instruction;Functional mobility training;Therapeutic activities;Therapeutic exercise;Balance training;Neuromuscular re-education;Patient/family education;Wheelchair mobility training    PT Goals (Current goals can be found in the Care Plan section)  Acute Rehab PT Goals Patient Stated Goal: To go to rehab to improve strength and have increased caregiver support PT Goal Formulation: With patient Time For Goal Achievement: 01/15/20 Potential to Achieve Goals: Fair Additional Goals Additional Goal #1: Pt will mobilize in power wheelchair for 100' around obstacles at a modI level    Frequency Min 2X/week   Barriers to discharge        Co-evaluation               AM-PAC PT "6 Clicks" Mobility  Outcome Measure Help needed turning from your back to your side while in a flat bed without using bedrails?: A Lot Help needed moving from lying on your back to sitting on the side of a flat bed without using bedrails?: A Little Help needed moving to and from a bed to a chair (including a wheelchair)?: Total Help needed standing up from a chair  using your arms (e.g., wheelchair or bedside chair)?: Total Help needed to walk in hospital room?: Total Help needed climbing 3-5 steps with a railing? : Total 6 Click Score: 9    End of Session   Activity Tolerance: Patient tolerated treatment well Patient left: in bed;with call bell/phone within reach;with bed alarm set Nurse Communication: Mobility status;Need for lift equipment PT Visit Diagnosis: Other abnormalities of gait and mobility (R26.89);Muscle weakness (generalized) (M62.81);Difficulty in walking, not elsewhere classified (R26.2)    Time: 1246-1300 PT Time Calculation (min) (ACUTE ONLY): 14 min   Charges:   PT Evaluation $PT Eval Low Complexity: 1 Low          [name], PT, DPT Acute Rehabilitation Pager: [phone]   [name] 01/01/2020, 1:18 PM

## 2020-01-01 NOTE — Assessment & Plan Note (Signed)
Pt given potassium we will check and replace .

## 2020-01-01 NOTE — ED Notes (Signed)
Assuming care of patient at this time. Pt is resting with eyes closed and in NAD. Resp even and unlabored.

## 2020-01-01 NOTE — Assessment & Plan Note (Addendum)
We will follow and transfuse if needed. suspect Acd from renal disease. Current hb is 9.9

## 2020-01-01 NOTE — Progress Notes (Signed)
  Echocardiogram 2D Echocardiogram has been performed.  [name] 01/01/2020, 10:41 AM

## 2020-01-01 NOTE — Assessment & Plan Note (Signed)
Po hydration and HD.

## 2020-01-01 NOTE — ED Notes (Signed)
Report given to [employer]. Pt is ready for transport upstairs.

## 2020-01-01 NOTE — ED Notes (Signed)
Assumed patient care.

## 2020-01-01 NOTE — ED Notes (Signed)
Pt given water to drink. 

## 2020-01-01 NOTE — Assessment & Plan Note (Signed)
Blood pressure 126/90, pulse 65, temperature 97.6 F (36.4 C), temperature source Oral, resp. rate 18, height 5\' 8"  (1.727 m), weight 127.7 kg, SpO2 99 %. B is good. We will continue diltiazem, benazepril 40, atenolol 25 mg and cont hydralazine prn.

## 2020-01-01 NOTE — ED Notes (Signed)
Date and time results received: 01/01/20 0320 (use smartphrase ".now" to insert current time)  Test: Potassium Critical Value: 2.6  Name of Provider Notified: Hospitalist  Orders Received? Or Actions Taken?: Pt currently receiving K runs and has been given oral K[place]-Con.

## 2020-01-01 NOTE — Progress Notes (Addendum)
PROGRESS NOTE    [name]  QJF:[id number] DOB: [birth year] DOA: 12/31/2019  PCP: [name], NP    Brief Narrative:  HPI: [name] is a 59 y.o. male with medical history significant for hypertension, poorly controlled diabetes mellitus, chronic leukopenia, history of CVA, and history of substance abuse, now presenting to the emergency department for evaluation of aches.  Patient reports that he recently moved to the area from [place] to be closer to family, has not taken any of his medicines in a couple days at least, and has developed cramping pain mainly in his legs.  He denies any recent substance abuse, denies any alcohol use, denies fevers, chills, or chest pain, but presents for evaluation of worsening leg cramps and also hopes to get help with nursing home placement.  ED Course: Upon arrival to the ED, patient is found to be afebrile, saturating well on room air, and hypertensive to 220/120.  EKG features sinus rhythm with LVH.  Chest x-ray is notable for chronic cardiomegaly but no acute findings.  Chemistry panel features a potassium of 2.5 and glucose 252.  CBC with chronic mild leukopenia and slight normocytic anemia.  BNP is slightly elevated.  COVID-19 screening test is negative.  Serum CK is 970.  Patient was given 20 mEq of potassium in the ED.   Assessment & Plan:  Hypertension Assessment & Plan Blood pressure 126/90, pulse 65, temperature 97.6 F (36.4 C), temperature source Oral, resp. rate 18, height 5\' 8"  (1.727 m), weight 127.7 kg, SpO2 99 %. B is good. We will continue diltiazem, benazepril 40, atenolol 25 mg and cont hydralazine prn.    Non-traumatic rhabdomyolysis Assessment & Plan Po hydration and HD.   Anemia Assessment & Plan We will follow and transfuse if needed. suspect Acd from renal disease. Current hb is 9.9   Grade I diastolic dysfunction Assessment & Plan Echocardiogram pending.    Hypokalemia Assessment & Plan Pt given  potassium we will check and replace .  Substance-related disorder [facility]) Assessment & Plan Counseling when he is awake but current uds negative.   Generalized body aches Assessment & Plan As reflected by elevated cpk suspect mild rhabdo, nontraumatic.  We will encourage po hydration.    DVT prophylaxis: lovenox Code Status: full Family Communication: none  Disposition Plan: TBD  Status is: Observation  The patient will require care spanning > 2 midnights and should be moved to inpatient because: Inpatient level of care appropriate due to severity of illness  Dispo: The patient is from: TBD              Anticipated d/c is to: TBD              Anticipated d/c date is: 3 days              Patient currently is not medically stable to d/c.  Consultants:  nephrology  Procedures:  HD ECHO   Subjective: Pt is drowsy and wake up and answers questions appropriately.    Objective: Vitals:   01/01/20 0800 01/01/20 0834 01/01/20 1142 01/01/20 1417  BP: 132/82 (!) 151/94 (!) 150/99 126/90  Pulse: 69 71 67 65  Resp: 16 18 20 18   Temp: 98.5 F (36.9 C) 97.9 F (36.6 C) 97.9 F (36.6 C) 97.6 F (36.4 C)  TempSrc: Oral Oral Oral Oral  SpO2: 95% 99% 97% 99%  Weight:  127.7 kg    Height:  5\' 8"  (1.727 m)  SpO2: 99 %  Intake/Output Summary (Last 24 hours) at 01/01/2020 1823 Last data filed at 01/01/2020 0238 Gross per 24 hour  Intake 200 ml  Output --  Net 200 ml   Filed Weights   12/31/19 1355 01/01/20 0834  Weight: 110.7 kg 127.7 kg    Examination: Blood pressure 126/90, pulse 65, temperature 97.6 F (36.4 C), temperature source Oral, resp. rate 18, height 5\' 8"  (1.727 m), weight 127.7 kg, SpO2 99 %. General exam: Appears calm and comfortable  HEENT:EOMI, perrl  Respiratory system: Clear to auscultation. Respiratory effort normal. Cardiovascular system: S1 & S2 heard, RRR. No JVD, murmurs, rubs, gallops or clicks. No pedal edema. Gastrointestinal system:  Abdomen is nondistended, soft and nontender. No organomegaly or masses felt. Normal bowel sounds heard. Central nervous system: Alert and oriented.CN grossly intact No focal neurological deficits. Extremities: pt moving all 4 ext and ambulating. Skin: No rashes, lesions or ulcers Psychiatry: Judgement and insight appear normal. Mood & affect appropriate.     Data Reviewed: I have personally reviewed following labs and imaging studies  I/O last 3 completed shifts: In: 200 [IV Piggyback:200] Out: -  No intake/output data recorded. Lab Results  Component Value Date   CREATININE 1.08 01/01/2020   CREATININE 1.07 12/31/2019   CREATININE 1.28 (H) 12/25/2019   CBC: Recent Labs  Lab 12/31/19 2122 01/01/20 0253  WBC 3.5* 3.7*  NEUTROABS 1.8 1.8  HGB 11.0* 9.9*  HCT 34.6* 31.7*  MCV 81.4 80.9  PLT 266 338   Basic Metabolic Panel: Recent Labs  Lab 12/31/19 2122 01/01/20 0045 01/01/20 0253  NA 136  --  138  K 2.5*  --  2.6*  CL 102  --  104  CO2 24  --  25  GLUCOSE 252*  --  221*  BUN 10  --  9  CREATININE 1.07  --  1.08  CALCIUM 8.4*  --  8.0*  MG  --  1.9  --    GFR: Estimated Creatinine Clearance: 97.1 mL/min (by C-G formula based on SCr of 1.08 mg/dL). Liver Function Tests: Recent Labs  Lab 12/31/19 2122  AST 28  ALT 18  ALKPHOS 90  BILITOT 0.6  PROT 5.8*  ALBUMIN 2.2*   No results for input(s): LIPASE, AMYLASE in the last 168 hours. No results for input(s): AMMONIA in the last 168 hours.  Coagulation Profile: No results for input(s): INR, PROTIME in the last 168 hours. Cardiac Enzymes: Recent Labs  Lab 12/31/19 2122  CKTOTAL 970*   BNP (last 3 results) No results for input(s): PROBNP in the last 8760 hours. HbA1C: No results for input(s): HGBA1C in the last 72 hours. CBG: Recent Labs  Lab 12/31/19 2130 01/01/20 0058 01/01/20 0720 01/01/20 1138 01/01/20 1622  GLUCAP 213* 199* 175* 179* 171*   Lipid Profile: No results for input(s): CHOL,  HDL, LDLCALC, TRIG, CHOLHDL, LDLDIRECT in the last 72 hours. Thyroid Function Tests: Recent Labs    01/01/20 0849  TSH 1.354  FREET4 1.24*   Anemia Panel: Recent Labs    01/01/20 0849  VITAMINB12 201  FOLATE 10.0  FERRITIN 107  TIBC 237*  IRON 32*  RETICCTPCT 1.6   Sepsis Labs: No results for input(s): PROCALCITON, LATICACIDVEN in the last 168 hours.  Recent Results (from the past 240 hour(s))  Respiratory Panel by RT PCR (Flu A&B, Covid) - Nasopharyngeal Swab     Status: None   Collection Time: 12/31/19  2:00 PM   Specimen: Nasopharyngeal Swab  Result  Value Ref Range Status   SARS Coronavirus 2 by RT PCR NEGATIVE NEGATIVE Final    Comment: (NOTE) SARS-CoV-2 target nucleic acids are NOT DETECTED.  The SARS-CoV-2 RNA is generally detectable in upper respiratoy specimens during the acute phase of infection. The lowest concentration of SARS-CoV-2 viral copies this assay can detect is 131 copies/mL. A negative result does not preclude SARS-Cov-2 infection and should not be used as the sole basis for treatment or other patient management decisions. A negative result may occur with  improper specimen collection/handling, submission of specimen other than nasopharyngeal swab, presence of viral mutation(s) within the areas targeted by this assay, and inadequate number of viral copies (<131 copies/mL). A negative result must be combined with clinical observations, patient history, and epidemiological information. The expected result is Negative.  Fact Sheet for Patients:  PinkCheek.be  Fact Sheet for Healthcare Providers:  GravelBags.it  This test is no t yet approved or cleared by the [place] FDA and  has been authorized for detection and/or diagnosis of SARS-CoV-2 by FDA under an Emergency Use Authorization (EUA). This EUA will remain  in effect (meaning this test can be used) for the duration of  the COVID-19 declaration under Section 564(b)(1) of the Act, 21 U.S.C. section 360bbb-3(b)(1), unless the authorization is terminated or revoked sooner.     Influenza A by PCR NEGATIVE NEGATIVE Final   Influenza B by PCR NEGATIVE NEGATIVE Final    Comment: (NOTE) The Xpert Xpress SARS-CoV-2/FLU/RSV assay is intended as an aid in  the diagnosis of influenza from Nasopharyngeal swab specimens and  should not be used as a sole basis for treatment. Nasal washings and  aspirates are unacceptable for Xpert Xpress SARS-CoV-2/FLU/RSV  testing.  Fact Sheet for Patients: PinkCheek.be  Fact Sheet for Healthcare Providers: GravelBags.it  This test is not yet approved or cleared by the [place] FDA and  has been authorized for detection and/or diagnosis of SARS-CoV-2 by  FDA under an Emergency Use Authorization (EUA). This EUA will remain  in effect (meaning this test can be used) for the duration of the  Covid-19 declaration under Section 564(b)(1) of the Act, 21  U.S.C. section 360bbb-3(b)(1), unless the authorization is  terminated or revoked. Performed at [facility], [place] [address], [place], [place] [address]       Radiology Studies: DG Chest 2 View  Result Date: 12/31/2019 CLINICAL DATA:  Shortness of breath.  Generalized body aches. EXAM: CHEST - 2 VIEW COMPARISON:  Radiograph 12/25/2019.  CT 12/12/2019 FINDINGS: Improved lung volumes from prior exam. Chronic cardiomegaly. No pulmonary edema. No focal airspace disease. Trace fluid in the fissures without significant sub pulmonic effusion. No confluent consolidation. No pneumothorax. No acute osseous abnormalities are seen. IMPRESSION: Chronic cardiomegaly. No acute chest findings. Electronically Signed   By: Keith Rake M.D.   On: 12/31/2019 20:43   ECHOCARDIOGRAM COMPLETE  Result Date: 01/01/2020    ECHOCARDIOGRAM REPORT   Patient Name:   DAELEN BELVEDERE Date  of Exam: 01/01/2020 Medical Rec #:  [id number]     Height:       68.0 in Accession #:    [id number]    Weight:       281.5 lb Date of Birth:  01-23-1961      BSA:          2.363 m Patient Age:    75 years      BP:           151/94  mmHg Patient Gender: M             HR:           71 bpm. Exam Location:  Inpatient Procedure: 2D Echo Indications:    hypertensive urgency  History:        Patient has prior history of Echocardiogram examinations, most                 recent 09/29/2017. Stroke; Risk Factors:Hypertension and Diabetes.  Sonographer:    Johny Chess Referring Phys: [place]   1. Left ventricular ejection fraction, by estimation, is 65 to 70%.  The left ventricle has normal function.  The left ventricle has no regional wall motion abnormalities.  There is moderate left ventricular hypertrophy.  Hyperdense myocardium, consider hypertensive cardiomyopathy versus infilrative cardiomyopathy.  Left ventricular diastolic parameters are consistent with Grade I diastolic dysfunction (impaired relaxation).   2. Right ventricular systolic function is normal.  The right ventricular size is normal. There is mildly elevated pulmonary artery systolic pressure. The estimated right ventricular systolic pressure is 67.6 mmHg.   3. Left atrial size was mildly dilated.   4. A small pericardial effusion is present. The pericardial effusion is circumferential.   5. The mitral valve is grossly normal. Mild mitral valve regurgitation.   6. The aortic valve is functionally bicuspid. There is moderate calcification of the aortic valve. Aortic valve regurgitation is trivial. Moderate aortic valve stenosis. Aortic valve mean gradient measures 13.0 mmHg. Aortic valve Vmax measures 2.37 m/s.  Dimentionless index 0.32.  7. The inferior vena cava is dilated in size with <50% respiratory variability, suggesting right atrial pressure of 15 mmHg.   FINDINGS  Left Ventricle: Left ventricular ejection  fraction, by estimation, is 65 to 70%. The left ventricle has normal function. The left ventricle has no regional wall motion abnormalities. The left ventricular internal cavity size was normal in size. There is  moderate left ventricular hypertrophy. Left ventricular diastolic parameters are consistent with Grade I diastolic dysfunction (impaired relaxation). Right Ventricle: The right ventricular size is normal. No increase in right ventricular wall thickness. Right ventricular systolic function is normal. There is mildly elevated pulmonary artery systolic pressure. The tricuspid regurgitant velocity is 2.66  m/s, and with an assumed right atrial pressure of 15 mmHg, the estimated right ventricular systolic pressure is 19.5 mmHg. Left Atrium: Left atrial size was mildly dilated. Right Atrium: Right atrial size was normal in size. Pericardium: A small pericardial effusion is present. The pericardial effusion is circumferential. Mitral Valve: The mitral valve is grossly normal. Mild mitral valve regurgitation. Tricuspid Valve: The tricuspid valve is grossly normal. Tricuspid valve regurgitation is mild. Aortic Valve: The aortic valve is bicuspid. There is moderate calcification of the aortic valve. There is mild to moderate aortic valve annular calcification. Aortic valve regurgitation is trivial. Moderate aortic stenosis is present. Aortic valve mean gradient measures 13.0 mmHg. Aortic valve peak gradient measures 22.5 mmHg. Aortic valve area, by VTI measures 1.21 cm. Pulmonic Valve: The pulmonic valve was grossly normal. Pulmonic valve regurgitation is trivial. Aorta: The aortic root is normal in size and structure. Venous: The inferior vena cava is dilated in size with less than 50% respiratory variability, suggesting right atrial pressure of 15 mmHg. IAS/Shunts: No atrial level shunt detected by color flow Doppler.  LEFT VENTRICLE PLAX 2D LVIDd:         4.80 cm  Diastology LVIDs:  3.20 cm  LV e' medial:     5.44 cm/s LV PW:         1.30 cm  LV E/e' medial:  12.9 LV IVS:        1.30 cm  LV e' lateral:   6.31 cm/s LVOT diam:     2.20 cm  LV E/e' lateral: 11.1 LV SV:         58 LV SV Index:   25 LVOT Area:     3.80 cm  RIGHT VENTRICLE             IVC RV S prime:     14.90 cm/s  IVC diam: 2.70 cm TAPSE (M-mode): 2.7 cm LEFT ATRIUM             Index       RIGHT ATRIUM           Index LA diam:        4.40 cm 1.86 cm/m  RA Area:     16.10 cm LA Vol (A2C):   57.0 ml 24.12 ml/m RA Volume:   41.60 ml  17.60 ml/m LA Vol (A4C):   87.9 ml 37.19 ml/m LA Biplane Vol: 73.9 ml 31.27 ml/m  AORTIC VALVE AV Area (Vmax):    1.30 cm AV Area (Vmean):   1.19 cm AV Area (VTI):     1.21 cm AV Vmax:           237.00 cm/s AV Vmean:          165.000 cm/s AV VTI:            0.482 m AV Peak Grad:      22.5 mmHg AV Mean Grad:      13.0 mmHg LVOT Vmax:         80.90 cm/s LVOT Vmean:        51.800 cm/s LVOT VTI:          0.153 m LVOT/AV VTI ratio: 0.32  AORTA Ao Root diam: 3.10 cm Ao Asc diam:  3.70 cm MITRAL VALVE               TRICUSPID VALVE MV Area (PHT): 3.77 cm    TR Peak grad:   28.3 mmHg MV Decel Time: 201 msec    TR Vmax:        266.00 cm/s MV E velocity: 70.30 cm/s MV A velocity: 85.30 cm/s  SHUNTS MV E/A ratio:  0.82        Systemic VTI:  0.15 m                            Systemic Diam: 2.20 cm Rozann Lesches MD Electronically signed by Rozann Lesches MD Signature Date/Time: 01/01/2020/1:40:54 PM    Final     Current Facility-Administered Medications (Endocrine & Metabolic):  .  insulin aspart (novoLOG) injection 0-5 Units .  insulin aspart (novoLOG) injection 0-9 Units .  insulin glargine (LANTUS) injection 40 Units   Current Facility-Administered Medications (Cardiovascular):  .  atenolol (TENORMIN) tablet 25 mg .  benazepril (LOTENSIN) tablet 40 mg .  diltiazem (CARDIZEM CD) 24 hr capsule 180 mg .  hydrALAZINE (APRESOLINE) tablet 50 mg     Current Facility-Administered Medications (Analgesics):  .   acetaminophen (TYLENOL) tablet 650 mg **OR** acetaminophen (TYLENOL) suppository 650 mg    Current Facility-Administered Medications (Hematological):  Marland Kitchen  [START ON 01/02/2020] enoxaparin (LOVENOX) injection 65 mg   Current Facility-Administered  Medications (Other):  .  0.9 %  sodium chloride infusion .  gabapentin (NEURONTIN) capsule 800 mg .  magnesium oxide (MAG-OX) tablet 400 mg .  pantoprazole (PROTONIX) EC tablet 40 mg .  polyethylene glycol (MIRALAX / GLYCOLAX) packet 17 g .  sodium chloride flush (NS) 0.9 % injection 3 mL .  sodium chloride flush (NS) 0.9 % injection 3 mL .  sodium chloride flush (NS) 0.9 % injection 3 mL  No current outpatient medications on file.  Anti-infectives (From admission, onward)   None      Scheduled Meds: . atenolol  25 mg Oral Daily  . benazepril  40 mg Oral Daily  . diltiazem  180 mg Oral Daily  . [START ON 01/02/2020] enoxaparin (LOVENOX) injection  65 mg Subcutaneous Daily  . gabapentin  800 mg Oral BID  . insulin aspart  0-5 Units Subcutaneous QHS  . insulin aspart  0-9 Units Subcutaneous TID WC  . insulin glargine  40 Units Subcutaneous QHS  . magnesium oxide  400 mg Oral BID  . pantoprazole  40 mg Oral BID  . sodium chloride flush  3 mL Intravenous Q12H  . sodium chloride flush  3 mL Intravenous Q12H   Continuous Infusions: . sodium chloride       LOS: 0 days    Para Skeans, MD [facility] Pager [phone] If 7PM-7AM, please contact night-coverage [email or web address] Password [facility] 01/01/2020, 6:23 PM

## 2020-01-02 DIAGNOSIS — R52 Pain, unspecified: Secondary | ICD-10-CM

## 2020-01-02 LAB — BASIC METABOLIC PANEL
Anion gap: 9 (ref 5–15)
Anion gap: 8 (ref 5–15)
BUN: 13 mg/dL (ref 6–20)
BUN: 13 mg/dL (ref 6–20)
CO2: 24 mmol/L (ref 22–32)
CO2: 23 mmol/L (ref 22–32)
Calcium: 8 mg/dL — ABNORMAL LOW (ref 8.9–10.3)
Calcium: 8.1 mg/dL — ABNORMAL LOW (ref 8.9–10.3)
Chloride: 105 mmol/L (ref 98–111)
Chloride: 102 mmol/L (ref 98–111)
Creatinine, Ser: 1.38 mg/dL — ABNORMAL HIGH (ref 0.61–1.24)
Creatinine, Ser: 1.42 mg/dL — ABNORMAL HIGH (ref 0.61–1.24)
GFR, Estimated: 59 mL/min — ABNORMAL LOW (ref >60)
GFR, Estimated: 57 mL/min — ABNORMAL LOW (ref >60)
Glucose, Bld: 99 mg/dL (ref 70–99)
Glucose, Bld: 146 mg/dL — ABNORMAL HIGH (ref 70–99)
Potassium: 2.7 mmol/L — CL (ref 3.5–5.1)
Potassium: 3 mmol/L — ABNORMAL LOW (ref 3.5–5.1)
Sodium: 138 mmol/L (ref 135–145)
Sodium: 133 mmol/L — ABNORMAL LOW (ref 135–145)

## 2020-01-02 LAB — GLUCOSE, CAPILLARY
Glucose-Capillary: 94 mg/dL (ref 70–99)
Glucose-Capillary: 129 mg/dL — ABNORMAL HIGH (ref 70–99)
Glucose-Capillary: 114 mg/dL — ABNORMAL HIGH (ref 70–99)
Glucose-Capillary: 197 mg/dL — ABNORMAL HIGH (ref 70–99)

## 2020-01-02 LAB — IRON AND TIBC
Iron: 29 ug/dL — ABNORMAL LOW (ref 45–182)
Saturation Ratios: 12 % — ABNORMAL LOW (ref 17.9–39.5)
TIBC: 245 ug/dL — ABNORMAL LOW (ref 250–450)
UIBC: 216 ug/dL

## 2020-01-02 LAB — RETICULOCYTES
Immature Retic Fract: 22.8 % — ABNORMAL HIGH (ref 2.3–15.9)
RBC.: 4.09 MIL/uL — ABNORMAL LOW (ref 4.22–5.81)
Retic Count, Absolute: 72.4 10*3/uL (ref 19.0–186.0)
Retic Ct Pct: 1.8 % (ref 0.4–3.1)

## 2020-01-02 LAB — CBC WITH DIFFERENTIAL/PLATELET
Abs Immature Granulocytes: 0.02 10*3/uL (ref 0.00–0.07)
Basophils Absolute: 0 10*3/uL (ref 0.0–0.1)
Basophils Relative: 1 %
Eosinophils Absolute: 0.2 10*3/uL (ref 0.0–0.5)
Eosinophils Relative: 4 %
HCT: 30.2 % — ABNORMAL LOW (ref 39.0–52.0)
Hemoglobin: 9.3 g/dL — ABNORMAL LOW (ref 13.0–17.0)
Immature Granulocytes: 1 %
Lymphocytes Relative: 40 %
Lymphs Abs: 1.4 10*3/uL (ref 0.7–4.0)
MCH: 25.1 pg — ABNORMAL LOW (ref 26.0–34.0)
MCHC: 30.8 g/dL (ref 30.0–36.0)
MCV: 81.4 fL (ref 80.0–100.0)
Monocytes Absolute: 0.6 10*3/uL (ref 0.1–1.0)
Monocytes Relative: 17 %
Neutro Abs: 1.3 10*3/uL — ABNORMAL LOW (ref 1.7–7.7)
Neutrophils Relative %: 37 %
Platelets: 222 10*3/uL (ref 150–400)
RBC: 3.71 MIL/uL — ABNORMAL LOW (ref 4.22–5.81)
RDW: 14.7 % (ref 11.5–15.5)
WBC: 3.5 10*3/uL — ABNORMAL LOW (ref 4.0–10.5)
nRBC: 0 % (ref 0.0–0.2)

## 2020-01-02 LAB — VITAMIN B12: Vitamin B-12: 278 pg/mL (ref 180–914)

## 2020-01-02 LAB — FOLATE: Folate: 8.7 ng/mL (ref >5.9)

## 2020-01-02 LAB — FERRITIN: Ferritin: 100 ng/mL (ref 24–336)

## 2020-01-02 LAB — TYPE AND SCREEN
ABO/RH(D): O POS
Antibody Screen: NEGATIVE

## 2020-01-02 LAB — ABO/RH: ABO/RH(D): O POS

## 2020-01-02 LAB — LACTIC ACID, PLASMA: Lactic Acid, Venous: 1.2 mmol/L (ref 0.5–1.9)

## 2020-01-02 LAB — CK: Total CK: 568 U/L — ABNORMAL HIGH (ref 49–397)

## 2020-01-02 MED ORDER — POTASSIUM CHLORIDE IN NACL 20-0.45 MEQ/L-% IV SOLN
INTRAVENOUS | Status: AC
  Filled 2020-01-02: qty 1000

## 2020-01-02 MED ORDER — PANTOPRAZOLE SODIUM 40 MG IV SOLR
40.0000 mg | INTRAVENOUS | Status: DC
  Administered 2020-01-02 – 2020-01-03 (×2): 40 mg via INTRAVENOUS
  Filled 2020-01-02 (×2): qty 40

## 2020-01-02 MED ORDER — POTASSIUM CHLORIDE 10 MEQ/100ML IV SOLN
10.0000 meq | INTRAVENOUS | Status: AC
  Administered 2020-01-02 (×4): 10 meq via INTRAVENOUS
  Filled 2020-01-02 (×4): qty 100

## 2020-01-02 MED ORDER — INSULIN GLARGINE 100 UNIT/ML ~~LOC~~ SOLN
25.0000 [IU] | Freq: Two times a day (BID) | SUBCUTANEOUS | Status: DC
  Administered 2020-01-02 – 2020-01-05 (×8): 25 [IU] via SUBCUTANEOUS
  Filled 2020-01-02 (×10): qty 0.25

## 2020-01-02 MED ORDER — HYDRALAZINE HCL 25 MG PO TABS
25.0000 mg | ORAL_TABLET | Freq: Three times a day (TID) | ORAL | Status: DC
  Administered 2020-01-02 (×2): 25 mg via ORAL
  Filled 2020-01-02 (×2): qty 1

## 2020-01-02 NOTE — TOC Initial Note (Signed)
Transition of Care [name]) - Initial/Assessment Note    Patient Details  Name: [name] MRN: [id number] Date of Birth: 1960-06-05  Transition of Care [name]) CM/SW Contact:    [name], [place] Phone Number: 01/02/2020, 11:16 AM  Clinical Narrative:                  CSW received consult for possible SNF placement at time of discharge. CSW spoke with patient regarding PT recommendation of SNF placement at time of discharge.  Patient expressed understanding of PT recommendation and is agreeable to SNF placement at time of discharge. Patient gave CSW permission to fax out initial referral near [place] area. Patient gave CSW permission to discuss his care with his brother [name].Patient has received his first Covid vaccine. Patient would like to receive his second Covid vaccine. CSW informed MD. No further questions reported at this time. CSW to continue to follow and assist with discharge planning needs.  Expected Discharge Plan: Skilled Nursing Facility Barriers to Discharge: Continued Medical Work up   Patient Goals and CMS Choice Patient states their goals for this hospitalization and ongoing recovery are:: to go to SNF CMS Medicare.gov Compare Post Acute Care list provided to:: Patient Choice offered to / list presented to : Patient  Expected Discharge Plan and Services Expected Discharge Plan: [place] Choice: [name] arrangements for the past 2 months: Single Family Home                                      Prior Living Arrangements/Services Living arrangements for the past 2 months: Single Family Home Lives with:: Self, Siblings, Parents (lives with brother and father) Patient language and need for interpreter reviewed:: Yes Do you feel safe going back to the place where you live?: No   SNF  Need for Family Participation in Patient Care: Yes (Comment) Care giver support system in place?: Yes  (comment) Current home services: Home RN Criminal Activity/Legal Involvement Pertinent to Current Situation/Hospitalization: No - Comment as needed  Activities of Daily Living      Permission Sought/Granted Permission sought to share information with : Case Manager, Family Supports, Chartered certified accountant granted to share information with : Yes, Verbal Permission Granted  Share Information with NAME: [name]  Permission granted to share info w AGENCY: SNF  Permission granted to share info w Relationship: brother  Permission granted to share info w Contact Information: [name] [phone]  Emotional Assessment Appearance:: Appears stated age Attitude/Demeanor/Rapport: Gracious Affect (typically observed): Calm Orientation: : Oriented to Self, Oriented to Place, Oriented to  Time, Oriented to Situation Alcohol / Substance Use: Not Applicable Psych Involvement: No (comment)  Admission diagnosis:  Hypokalemia [E87.6] Weakness [R53.1] Elevated CK [R74.8] Hypertensive urgency [I16.0] Hypertension, unspecified type [I10] Patient Active Problem List   Diagnosis Date Noted  . Generalized body aches 01/01/2020  . Non-traumatic rhabdomyolysis 01/01/2020  . Grade I diastolic dysfunction 15/17/6160  . Anemia 01/01/2020  . Hypertensive urgency 12/31/2019  . Hypokalemia 12/31/2019  . Elevated CK   . Chronic diastolic CHF (congestive heart failure) ([place]) 12/31/2017  . Poorly controlled diabetes mellitus ([place])   . Pure hypercholesterolemia   . Hyperglycemia 09/28/2017  . Nonobstructive atherosclerosis of coronary artery 09/28/2017  . MDD (major depressive disorder), recurrent episode, severe ([place]) 07/30/2017  . Osteoarthritis 04/12/2015  . Severe recurrent major  depression with psychotic features ([place]) 01/04/2015  . Substance-related disorder ([place]) 10/30/2014  . Patient's noncompliance with other medical treatment and regimen 06/07/2014  . Cocaine dependence, uncomplicated  ([place]) 09/05/1973  . Chronic pain 05/31/2014  . Cocaine abuse ([place]) 05/31/2014  . History of noncompliance with medical treatment 05/31/2014  . Malingering 05/31/2014  . Leukopenia 05/31/2014  . Diabetic neuropathy associated with diabetes mellitus due to underlying condition ([place]) 05/31/2014  . Diabetic neuropathy ([place]) 05/31/2014  . Congestive heart failure with left ventricular systolic dysfunction ([place]) 02/22/2014  . Depression, major, recurrent, severe with psychosis ([place]) 08/29/2013  . Suicidal ideations 08/28/2013  . Uncontrolled diabetes mellitus ([place]) 06/30/2013  . Atypical chest pain 12/09/2011  . Hypertension 12/09/2011  . Episodic mood disorder ([place]) 05/13/2011   PCP:  [name], NP Pharmacy:   [id number] [address], [place] [address] Phone: [phone] Fax: [phone]     Social Determinants of Health (SDOH) Interventions    Readmission Risk Interventions No flowsheet data found.

## 2020-01-02 NOTE — Progress Notes (Signed)
PROGRESS NOTE    [name]  OFH:[id number] DOB: 1960-07-13 DOA: 12/31/2019  PCP: [name], NP    Brief Narrative:  HPI: [name] is a 59 y.o. male with medical history significant for hypertension, poorly controlled diabetes mellitus, chronic leukopenia, history of CVA, and history of substance abuse, now presenting to the emergency department for evaluation of aches.  Patient reports that he recently moved to the area from [place] to be closer to family, has not taken any of his medicines in a couple days at least, and has developed cramping pain mainly in his legs.  He denies any recent substance abuse, denies any alcohol use, denies fevers, chills, or chest pain, but presents for evaluation of worsening leg cramps and also hopes to get help with nursing home placement.  ED Course: Upon arrival to the ED, patient is found to be afebrile, saturating well on room air, and hypertensive to 220/120.  EKG features sinus rhythm with LVH.  Chest x-ray is notable for chronic cardiomegaly but no acute findings.  Chemistry panel features a potassium of 2.5 and glucose 252.  CBC with chronic mild leukopenia and slight normocytic anemia.  BNP is slightly elevated.  COVID-19 screening test is negative.  Serum CK is 970.  Patient was given 20 mEq of potassium in the ED.   Assessment & Plan:  Hypertension Assessment & Plan Blood pressure 126/90, pulse 65, temperature 97.6 F (36.4 C), temperature source Oral, resp. rate 18, height 5\' 8"  (1.727 m), weight 127.7 kg, SpO2 99 %. Lab is good. We will continue diltiazem, benazepril 40, atenolol 25 mg and cont hydralazine prn.   01/02/20 Blood pressure (!) 155/96, pulse 66, temperature (!) 97.4 F (36.3 C), temperature source Oral, resp. rate 18, height 5\' 8"  (1.727 m), weight 125.8 kg, SpO2 97 %. will add hydralazine 25 mg tid in addition to his other meds.   Non-traumatic rhabdomyolysis Assessment & Plan Po hydration and  HD.   Anemia Assessment & Plan We will follow and transfuse if needed. suspect Acd from renal disease. Current hb is 9.9   Grade I diastolic dysfunction Assessment & Plan Echocardiogram pending.    Hypokalemia Assessment & Plan Pt given potassium we will check and replace . Potassium is still low at 2.7 he has been give 40 meq iv and repeat level is pending.    Substance-related disorder [facility]) Assessment & Plan Counseling when he is awake but current uds negative.   Generalized body aches Assessment & Plan As reflected by elevated cpk suspect mild rhabdo, nontraumatic.  We will encourage po hydration.  Diarrhea: Assessment & Plan Received reports from nurse about pt having diarrhea and sample has not been collected. Pt in isolation for C.Diff.  DVT prophylaxis: lovenox Code Status: full Family Communication: none  Disposition Plan: TBD  Status is: Observation  The patient will require care spanning > 2 midnights and should be moved to inpatient because: Inpatient level of care appropriate due to severity of illness  Dispo: The patient is from: TBD              Anticipated d/c is to: TBD              Anticipated d/c date is: 3 days              Patient currently is not medically stable to d/c.  Consultants:  nephrology  Procedures:  HD ECHO   Subjective: Pt is drowsy and wake up and answers  questions appropriately.   Pt is alert today and denies any complaints says he did not have diarrhea. Labs still show hypokalemia and hemoglobin is 9.3.    Objective: Vitals:   01/01/20 2333 01/02/20 0605 01/02/20 0800 01/02/20 1030  BP: 128/68 120/68 (!) 175/93 (!) 155/96  Pulse: 68 68 68 66  Resp: 18 18 19 18   Temp: 98 F (36.7 C) 98 F (36.7 C) 98 F (36.7 C) (!) 97.4 F (36.3 C)  TempSrc: Oral Oral Oral Oral  SpO2: 97% 98% 96% 97%  Weight:  125.8 kg    Height:       SpO2: 97 %  Intake/Output Summary (Last 24 hours) at 01/02/2020 1535 Last data  filed at 01/02/2020 1025 Gross per 24 hour  Intake 603.31 ml  Output --  Net 603.31 ml   Filed Weights   12/31/19 1355 01/01/20 0834 01/02/20 0605  Weight: 110.7 kg 127.7 kg 125.8 kg    Examination: Blood pressure (!) 155/96, pulse 66, temperature (!) 97.4 F (36.3 C), temperature source Oral, resp. rate 18, height 5\' 8"  (1.727 m), weight 125.8 kg, SpO2 97 %. General exam: Appears calm and comfortable  HEENT:EOMI, perrl  Respiratory system: Clear to auscultation. Respiratory effort normal. Cardiovascular system: S1 & S2 heard, RRR. No JVD, murmurs, rubs, gallops or clicks. No pedal edema. Gastrointestinal system: Abdomen is nondistended, soft and nontender. No organomegaly or masses felt. Normal bowel sounds heard. Central nervous system: Alert and oriented.CN grossly intact No focal neurological deficits. Extremities: pt moving all 4 ext and ambulating. Skin: No rashes, lesions or ulcers Psychiatry: Judgement and insight appear normal. Mood & affect appropriate.     Data Reviewed: I have personally reviewed following labs and imaging studies  I/O last 3 completed shifts: In: 200 [IV Piggyback:200] Out: -  Total I/O In: 603.3 [P.O.:450; IV Piggyback:153.3] Out: -  Lab Results  Component Value Date   CREATININE 1.38 (H) 01/02/2020   CREATININE 1.08 01/01/2020   CREATININE 1.07 12/31/2019   CBC: Recent Labs  Lab 12/31/19 2122 01/01/20 0253 01/02/20 0750  WBC 3.5* 3.7* 3.5*  NEUTROABS 1.8 1.8 1.3*  HGB 11.0* 9.9* 9.3*  HCT 34.6* 31.7* 30.2*  MCV 81.4 80.9 81.4  PLT 266 254 951   Basic Metabolic Panel: Recent Labs  Lab 12/31/19 2122 01/01/20 0045 01/01/20 0253 01/02/20 0750  NA 136  --  138 138  K 2.5*  --  2.6* 2.7*  CL 102  --  104 105  CO2 24  --  25 24  GLUCOSE 252*  --  221* 99  BUN 10  --  9 13  CREATININE 1.07  --  1.08 1.38*  CALCIUM 8.4*  --  8.0* 8.0*  MG  --  1.9  --   --    GFR: Estimated Creatinine Clearance: 75.4 mL/min (A) (by C-G  formula based on SCr of 1.38 mg/dL (H)). Liver Function Tests: Recent Labs  Lab 12/31/19 2122  AST 28  ALT 18  ALKPHOS 90  BILITOT 0.6  PROT 5.8*  ALBUMIN 2.2*   No results for input(s): LIPASE, AMYLASE in the last 168 hours. No results for input(s): AMMONIA in the last 168 hours.  Coagulation Profile: No results for input(s): INR, PROTIME in the last 168 hours. Cardiac Enzymes: Recent Labs  Lab 12/31/19 2122 01/02/20 0750  CKTOTAL 970* 568*   BNP (last 3 results) No results for input(s): PROBNP in the last 8760 hours. HbA1C: No results for input(s): HGBA1C in the  last 72 hours. CBG: Recent Labs  Lab 01/01/20 1138 01/01/20 1622 01/01/20 2106 01/02/20 0844 01/02/20 1120  GLUCAP 179* 171* 234* 94 129*   Lipid Profile: No results for input(s): CHOL, HDL, LDLCALC, TRIG, CHOLHDL, LDLDIRECT in the last 72 hours. Thyroid Function Tests: Recent Labs    01/01/20 0849  TSH 1.354  FREET4 1.24*   Anemia Panel: Recent Labs    01/01/20 0849  VITAMINB12 201  FOLATE 10.0  FERRITIN 107  TIBC 237*  IRON 32*  RETICCTPCT 1.6   Sepsis Labs: No results for input(s): PROCALCITON, LATICACIDVEN in the last 168 hours.  Recent Results (from the past 240 hour(s))  Respiratory Panel by RT PCR (Flu A&B, Covid) - Nasopharyngeal Swab     Status: None   Collection Time: 12/31/19  2:00 PM   Specimen: Nasopharyngeal Swab  Result Value Ref Range Status   SARS Coronavirus 2 by RT PCR NEGATIVE NEGATIVE Final    Comment: (NOTE) SARS-CoV-2 target nucleic acids are NOT DETECTED.  The SARS-CoV-2 RNA is generally detectable in upper respiratoy specimens during the acute phase of infection. The lowest concentration of SARS-CoV-2 viral copies this assay can detect is 131 copies/mL. A negative result does not preclude SARS-Cov-2 infection and should not be used as the sole basis for treatment or other patient management decisions. A negative result may occur with  improper specimen  collection/handling, submission of specimen other than nasopharyngeal swab, presence of viral mutation(s) within the areas targeted by this assay, and inadequate number of viral copies (<131 copies/mL). A negative result must be combined with clinical observations, patient history, and epidemiological information. The expected result is Negative.  Fact Sheet for Patients:  PinkCheek.be  Fact Sheet for Healthcare Providers:  GravelBags.it  This test is no t yet approved or cleared by the [place] FDA and  has been authorized for detection and/or diagnosis of SARS-CoV-2 by FDA under an Emergency Use Authorization (EUA). This EUA will remain  in effect (meaning this test can be used) for the duration of the COVID-19 declaration under Section 564(b)(1) of the Act, 21 U.S.C. section 360bbb-3(b)(1), unless the authorization is terminated or revoked sooner.     Influenza A by PCR NEGATIVE NEGATIVE Final   Influenza B by PCR NEGATIVE NEGATIVE Final    Comment: (NOTE) The Xpert Xpress SARS-CoV-2/FLU/RSV assay is intended as an aid in  the diagnosis of influenza from Nasopharyngeal swab specimens and  should not be used as a sole basis for treatment. Nasal washings and  aspirates are unacceptable for Xpert Xpress SARS-CoV-2/FLU/RSV  testing.  Fact Sheet for Patients: PinkCheek.be  Fact Sheet for Healthcare Providers: GravelBags.it  This test is not yet approved or cleared by the [place] FDA and  has been authorized for detection and/or diagnosis of SARS-CoV-2 by  FDA under an Emergency Use Authorization (EUA). This EUA will remain  in effect (meaning this test can be used) for the duration of the  Covid-19 declaration under Section 564(b)(1) of the Act, 21  U.S.C. section 360bbb-3(b)(1), unless the authorization is  terminated or revoked. Performed at [facility], [place] [address] [place], [place] [address]       Radiology Studies: DG Chest 2 View  Result Date: 12/31/2019 CLINICAL DATA:  Shortness of breath.  Generalized body aches. EXAM: CHEST - 2 VIEW COMPARISON:  Radiograph 12/25/2019.  CT 12/12/2019 FINDINGS: Improved lung volumes from prior exam. Chronic cardiomegaly. No pulmonary edema. No focal airspace disease. Trace fluid in the fissures  without significant sub pulmonic effusion. No confluent consolidation. No pneumothorax. No acute osseous abnormalities are seen. IMPRESSION: Chronic cardiomegaly. No acute chest findings. Electronically Signed   By: Keith Rake M.D.   On: 12/31/2019 20:43   ECHOCARDIOGRAM COMPLETE  Result Date: 01/01/2020    ECHOCARDIOGRAM REPORT   Patient Name:   CHAROD SLAWINSKI Date of Exam: 01/01/2020 Medical Rec #:  [id number]     Height:       68.0 in Accession #:    [id number]    Weight:       281.5 lb Date of Birth:  [birth year]      BSA:          2.363 m Patient Age:    43 years      BP:           151/94 mmHg Patient Gender: M             HR:           71 bpm. Exam Location:  Inpatient Procedure: 2D Echo Indications:    hypertensive urgency  History:        Patient has prior history of Echocardiogram examinations, most                 recent 09/29/2017. Stroke; Risk Factors:Hypertension and Diabetes.  Sonographer:    Johny Chess Referring Phys: [place]   1. Left ventricular ejection fraction, by estimation, is 65 to 70%.  The left ventricle has normal function.  The left ventricle has no regional wall motion abnormalities.  There is moderate left ventricular hypertrophy.  Hyperdense myocardium, consider hypertensive cardiomyopathy versus infilrative cardiomyopathy.  Left ventricular diastolic parameters are consistent with Grade I diastolic dysfunction (impaired relaxation).   2. Right ventricular systolic function is normal.  The right ventricular size is normal. There is  mildly elevated pulmonary artery systolic pressure. The estimated right ventricular systolic pressure is 34.1 mmHg.   3. Left atrial size was mildly dilated.   4. A small pericardial effusion is present. The pericardial effusion is circumferential.   5. The mitral valve is grossly normal. Mild mitral valve regurgitation.   6. The aortic valve is functionally bicuspid. There is moderate calcification of the aortic valve. Aortic valve regurgitation is trivial. Moderate aortic valve stenosis. Aortic valve mean gradient measures 13.0 mmHg. Aortic valve Vmax measures 2.37 m/s.  Dimentionless index 0.32.  7. The inferior vena cava is dilated in size with <50% respiratory variability, suggesting right atrial pressure of 15 mmHg.   FINDINGS  Left Ventricle: Left ventricular ejection fraction, by estimation, is 65 to 70%. The left ventricle has normal function. The left ventricle has no regional wall motion abnormalities. The left ventricular internal cavity size was normal in size. There is  moderate left ventricular hypertrophy. Left ventricular diastolic parameters are consistent with Grade I diastolic dysfunction (impaired relaxation). Right Ventricle: The right ventricular size is normal. No increase in right ventricular wall thickness. Right ventricular systolic function is normal. There is mildly elevated pulmonary artery systolic pressure. The tricuspid regurgitant velocity is 2.66  m/s, and with an assumed right atrial pressure of 15 mmHg, the estimated right ventricular systolic pressure is 96.2 mmHg. Left Atrium: Left atrial size was mildly dilated. Right Atrium: Right atrial size was normal in size. Pericardium: A small pericardial effusion is present. The pericardial effusion is circumferential. Mitral Valve: The mitral valve is grossly normal. Mild mitral valve regurgitation. Tricuspid Valve: The tricuspid valve  is grossly normal. Tricuspid valve regurgitation is mild. Aortic Valve: The aortic valve is  bicuspid. There is moderate calcification of the aortic valve. There is mild to moderate aortic valve annular calcification. Aortic valve regurgitation is trivial. Moderate aortic stenosis is present. Aortic valve mean gradient measures 13.0 mmHg. Aortic valve peak gradient measures 22.5 mmHg. Aortic valve area, by VTI measures 1.21 cm. Pulmonic Valve: The pulmonic valve was grossly normal. Pulmonic valve regurgitation is trivial. Aorta: The aortic root is normal in size and structure. Venous: The inferior vena cava is dilated in size with less than 50% respiratory variability, suggesting right atrial pressure of 15 mmHg. IAS/Shunts: No atrial level shunt detected by color flow Doppler.  LEFT VENTRICLE PLAX 2D LVIDd:         4.80 cm  Diastology LVIDs:         3.20 cm  LV e' medial:    5.44 cm/s LV PW:         1.30 cm  LV E/e' medial:  12.9 LV IVS:        1.30 cm  LV e' lateral:   6.31 cm/s LVOT diam:     2.20 cm  LV E/e' lateral: 11.1 LV SV:         58 LV SV Index:   25 LVOT Area:     3.80 cm  RIGHT VENTRICLE             IVC RV S prime:     14.90 cm/s  IVC diam: 2.70 cm TAPSE (M-mode): 2.7 cm LEFT ATRIUM             Index       RIGHT ATRIUM           Index LA diam:        4.40 cm 1.86 cm/m  RA Area:     16.10 cm LA Vol (A2C):   57.0 ml 24.12 ml/m RA Volume:   41.60 ml  17.60 ml/m LA Vol (A4C):   87.9 ml 37.19 ml/m LA Biplane Vol: 73.9 ml 31.27 ml/m  AORTIC VALVE AV Area (Vmax):    1.30 cm AV Area (Vmean):   1.19 cm AV Area (VTI):     1.21 cm AV Vmax:           237.00 cm/s AV Vmean:          165.000 cm/s AV VTI:            0.482 m AV Peak Grad:      22.5 mmHg AV Mean Grad:      13.0 mmHg LVOT Vmax:         80.90 cm/s LVOT Vmean:        51.800 cm/s LVOT VTI:          0.153 m LVOT/AV VTI ratio: 0.32  AORTA Ao Root diam: 3.10 cm Ao Asc diam:  3.70 cm MITRAL VALVE               TRICUSPID VALVE MV Area (PHT): 3.77 cm    TR Peak grad:   28.3 mmHg MV Decel Time: 201 msec    TR Vmax:        266.00 cm/s MV E  velocity: 70.30 cm/s MV A velocity: 85.30 cm/s  SHUNTS MV E/A ratio:  0.82        Systemic VTI:  0.15 m  Systemic Diam: 2.20 cm Rozann Lesches MD Electronically signed by Rozann Lesches MD Signature Date/Time: 01/01/2020/1:40:54 PM    Final     Current Facility-Administered Medications (Endocrine & Metabolic):  .  insulin aspart (novoLOG) injection 0-5 Units .  insulin aspart (novoLOG) injection 0-9 Units .  insulin glargine (LANTUS) injection 40 Units   Current Facility-Administered Medications (Cardiovascular):  .  atenolol (TENORMIN) tablet 25 mg .  benazepril (LOTENSIN) tablet 40 mg .  diltiazem (CARDIZEM CD) 24 hr capsule 180 mg .  hydrALAZINE (APRESOLINE) tablet 50 mg     Current Facility-Administered Medications (Analgesics):  .  acetaminophen (TYLENOL) tablet 650 mg **OR** acetaminophen (TYLENOL) suppository 650 mg    Current Facility-Administered Medications (Hematological):  Marland Kitchen  [START ON 01/02/2020] enoxaparin (LOVENOX) injection 65 mg   Current Facility-Administered Medications (Other):  .  0.9 %  sodium chloride infusion .  gabapentin (NEURONTIN) capsule 800 mg .  magnesium oxide (MAG-OX) tablet 400 mg .  pantoprazole (PROTONIX) EC tablet 40 mg .  polyethylene glycol (MIRALAX / GLYCOLAX) packet 17 g .  sodium chloride flush (NS) 0.9 % injection 3 mL .  sodium chloride flush (NS) 0.9 % injection 3 mL .  sodium chloride flush (NS) 0.9 % injection 3 mL  No current outpatient medications on file.  Anti-infectives (From admission, onward)   None      Scheduled Meds: . atenolol  25 mg Oral Daily  . benazepril  40 mg Oral Daily  . diltiazem  180 mg Oral Daily  . enoxaparin (LOVENOX) injection  65 mg Subcutaneous Daily  . gabapentin  800 mg Oral BID  . insulin aspart  0-5 Units Subcutaneous QHS  . insulin aspart  0-9 Units Subcutaneous TID WC  . insulin glargine  25 Units Subcutaneous BID  . magnesium oxide  400 mg Oral BID  .  pantoprazole (PROTONIX) IV  40 mg Intravenous Q24H  . sodium chloride flush  3 mL Intravenous Q12H  . sodium chloride flush  3 mL Intravenous Q12H   Continuous Infusions: . 0.45 % NaCl with KCl 20 mEq / L 20 mL/hr at 01/02/20 1025  . sodium chloride       LOS: 0 days    Para Skeans, MD [facility] Pager [phone] If 7PM-7AM, please contact night-coverage [email or web address] Password [facility] 01/02/2020, 3:35 PM

## 2020-01-02 NOTE — NC FL2 (Signed)
[place] LEVEL OF CARE SCREENING TOOL     IDENTIFICATION  Patient Name: [name] Birthdate: September 22, 1960 Sex: male Admission Date (Current Location): 12/31/2019  [facility] [address] and [place] Number:  Herbalist and Address:  The . [facility], [place] [address] [place], [place] [address]      Provider Number: [id number]  Attending Physician Name and Address:  Para Skeans, MD  Relative Name and Phone Number:  [name] [phone]    Current Level of Care: Hospital Recommended Level of Care: [place] Prior Approval Number:    Date Approved/Denied:   PASRR Number: [id number]  Discharge Plan: SNF    Current Diagnoses: Patient Active Problem List   Diagnosis Date Noted  . Generalized body aches 01/01/2020  . Non-traumatic rhabdomyolysis 01/01/2020  . Grade I diastolic dysfunction 86/76/1950  . Anemia 01/01/2020  . Hypertensive urgency 12/31/2019  . Hypokalemia 12/31/2019  . Elevated CK   . Chronic diastolic CHF (congestive heart failure) ([place]) 12/31/2017  . Poorly controlled diabetes mellitus ([place])   . Pure hypercholesterolemia   . Hyperglycemia 09/28/2017  . Nonobstructive atherosclerosis of coronary artery 09/28/2017  . MDD (major depressive disorder), recurrent episode, severe ([place]) 07/30/2017  . Osteoarthritis 04/12/2015  . Severe recurrent major depression with psychotic features ([place]) 01/04/2015  . Substance-related disorder ([place]) 10/30/2014  . Patient's noncompliance with other medical treatment and regimen 06/07/2014  . Cocaine dependence, uncomplicated ([place]) 93/26/7124  . Chronic pain 05/31/2014  . Cocaine abuse ([place]) 05/31/2014  . History of noncompliance with medical treatment 05/31/2014  . Malingering 05/31/2014  . Leukopenia 05/31/2014  . Diabetic neuropathy associated with diabetes mellitus due to underlying condition ([place]) 05/31/2014  . Diabetic neuropathy ([place]) 05/31/2014  . Congestive heart failure  with left ventricular systolic dysfunction ([place]) 02/22/2014  . Depression, major, recurrent, severe with psychosis ([place]) 08/29/2013  . Suicidal ideations 08/28/2013  . Uncontrolled diabetes mellitus ([place]) 06/30/2013  . Atypical chest pain 12/09/2011  . Hypertension 12/09/2011  . Episodic mood disorder ([place]) 05/13/2011    Orientation RESPIRATION BLADDER Height & Weight     Self, Time, Situation, Place  Normal Continent Weight: 277 lb 5.4 oz (125.8 kg) Height:  5\' 8"  (172.7 cm)  BEHAVIORAL SYMPTOMS/MOOD NEUROLOGICAL BOWEL NUTRITION STATUS      Incontinent (Type 7,liquid consistency with no solid pieces,brown,large) Diet (See Discharge Summary)  AMBULATORY STATUS COMMUNICATION OF NEEDS Skin   Extensive Assist Verbally Skin abrasions (dry,intact,abrasion;cracking leg right;left)                       Personal Care Assistance Level of Assistance  Bathing, Feeding, Dressing Bathing Assistance: Maximum assistance Feeding assistance: Limited assistance (Needs help sitting up) Dressing Assistance: Maximum assistance     Functional Limitations Info  Sight, Hearing, Speech Sight Info: Adequate Hearing Info: Adequate Speech Info: Adequate    SPECIAL CARE FACTORS FREQUENCY  PT (By licensed PT), OT (By licensed OT)     PT Frequency: 5x min weekly OT Frequency: 5x min weekly            Contractures Contractures Info: Not present    Additional Factors Info  Code Status, Insulin Sliding Scale, Allergies Code Status Info: FULL Allergies Info: Other-Allergy to GRITS , unknown exactly the specific ingredient he is allergic too   Insulin Sliding Scale Info: insulin aspart (novoLOG) injection 0-5 Units daily at bedtime,insulin aspart (novoLOG) injection 0-9 Units 3 times daily with meals,insulin glargine (LANTUS) injection 25 Units  2 times daily,       Current Medications (01/02/2020):  This is the current hospital active medication list Current Facility-Administered Medications   Medication Dose Route Frequency Provider Last Rate Last Admin  . 0.45 % NaCl with KCl 20 mEq / L infusion   Intravenous Continuous Para Skeans, MD 20 mL/hr at 01/02/20 1025 New Bag at 01/02/20 1025  . 0.9 %  sodium chloride infusion  250 mL Intravenous PRN Opyd, Ilene Qua, MD      . acetaminophen (TYLENOL) tablet 650 mg  650 mg Oral Q6H PRN Opyd, Ilene Qua, MD       Or  . acetaminophen (TYLENOL) suppository 650 mg  650 mg Rectal Q6H PRN Opyd, Ilene Qua, MD      . atenolol (TENORMIN) tablet 25 mg  25 mg Oral Daily Opyd, Ilene Qua, MD   25 mg at 01/02/20 0834  . benazepril (LOTENSIN) tablet 40 mg  40 mg Oral Daily Opyd, Ilene Qua, MD   40 mg at 01/02/20 0834  . diltiazem (CARDIZEM CD) 24 hr capsule 180 mg  180 mg Oral Daily Opyd, Ilene Qua, MD   180 mg at 01/02/20 0834  . enoxaparin (LOVENOX) injection 65 mg  65 mg Subcutaneous Daily Leward Quan, RPH   65 mg at 01/02/20 0834  . gabapentin (NEURONTIN) capsule 800 mg  800 mg Oral BID Opyd, Ilene Qua, MD   800 mg at 01/02/20 0834  . hydrALAZINE (APRESOLINE) tablet 50 mg  50 mg Oral Q6H PRN Opyd, Ilene Qua, MD      . insulin aspart (novoLOG) injection 0-5 Units  0-5 Units Subcutaneous QHS Opyd, Ilene Qua, MD   2 Units at 01/01/20 2231  . insulin aspart (novoLOG) injection 0-9 Units  0-9 Units Subcutaneous TID WC Opyd, Ilene Qua, MD   2 Units at 01/01/20 1701  . insulin glargine (LANTUS) injection 25 Units  25 Units Subcutaneous BID Para Skeans, MD   25 Units at 01/02/20 [phone]  . magnesium oxide (MAG-OX) tablet 400 mg  400 mg Oral BID Para Skeans, MD   400 mg at 01/02/20 0834  . pantoprazole (PROTONIX) injection 40 mg  40 mg Intravenous Q24H Para Skeans, MD   40 mg at 01/02/20 0833  . polyethylene glycol (MIRALAX / GLYCOLAX) packet 17 g  17 g Oral Daily PRN Opyd, Ilene Qua, MD      . potassium chloride 10 mEq in 100 mL IVPB  10 mEq Intravenous Q1 Hr x 4 Florina Ou V, MD 100 mL/hr at 01/02/20 1124 10 mEq at 01/02/20 1124  . sodium chloride  flush (NS) 0.9 % injection 3 mL  3 mL Intravenous Q12H Opyd, Ilene Qua, MD   3 mL at 01/01/20 2234  . sodium chloride flush (NS) 0.9 % injection 3 mL  3 mL Intravenous Q12H Opyd, Ilene Qua, MD   3 mL at 01/02/20 0847  . sodium chloride flush (NS) 0.9 % injection 3 mL  3 mL Intravenous PRN Opyd, Ilene Qua, MD         Discharge Medications: Please see discharge summary for a list of discharge medications.  Relevant Imaging Results:  Relevant Lab Results:   Additional Information [id number]  Trula Ore, LCSWA

## 2020-01-02 NOTE — TOC Progression Note (Addendum)
Transition of Care [facility]) - Progression Note    Patient Details  Name: TIMOTHEUS SALM MRN: [id number] Date of Birth: 02-25-1961  Transition of Care [facility]) CM/SW [place], [place] Phone Number: 01/02/2020, 11:59 AM  Clinical Narrative:     CSW started insurance authorization for patient. Reference number is #[id number]. Requested start date is for tomorrow 11/8. CSW submitted clinicals to patients insurance for review.  Pending SNF choice. Pending Insurance authorization.  CSW will continue to follow.  Expected Discharge Plan: [place] Barriers to Discharge: Continued Medical Work up  Expected Discharge Plan and Services Expected Discharge Plan: [place] Choice: [place] arrangements for the past 2 months: Single Family Home                                       Social Determinants of Health (SDOH) Interventions    Readmission Risk Interventions No flowsheet data found.

## 2020-01-03 ENCOUNTER — Observation Stay: Payer: Medicare HMO

## 2020-01-03 ENCOUNTER — Observation Stay (HOSPITAL_COMMUNITY): Payer: Medicare HMO

## 2020-01-03 DIAGNOSIS — R609 Edema, unspecified: Secondary | ICD-10-CM

## 2020-01-03 DIAGNOSIS — R52 Pain, unspecified: Secondary | ICD-10-CM | POA: Diagnosis not present

## 2020-01-03 DIAGNOSIS — Z23 Encounter for immunization: Secondary | ICD-10-CM

## 2020-01-03 LAB — COMPREHENSIVE METABOLIC PANEL
ALT: 14 U/L (ref 0–44)
AST: 22 U/L (ref 15–41)
Albumin: 1.8 g/dL — ABNORMAL LOW (ref 3.5–5.0)
Alkaline Phosphatase: 73 U/L (ref 38–126)
Anion gap: 8 (ref 5–15)
BUN: 14 mg/dL (ref 6–20)
CO2: 23 mmol/L (ref 22–32)
Calcium: 7.9 mg/dL — ABNORMAL LOW (ref 8.9–10.3)
Chloride: 103 mmol/L (ref 98–111)
Creatinine, Ser: 1.3 mg/dL — ABNORMAL HIGH (ref 0.61–1.24)
GFR, Estimated: >60 mL/min (ref >60)
Glucose, Bld: 186 mg/dL — ABNORMAL HIGH (ref 70–99)
Potassium: 2.9 mmol/L — ABNORMAL LOW (ref 3.5–5.1)
Sodium: 134 mmol/L — ABNORMAL LOW (ref 135–145)
Total Bilirubin: 0.4 mg/dL (ref 0.3–1.2)
Total Protein: 5 g/dL — ABNORMAL LOW (ref 6.5–8.1)

## 2020-01-03 LAB — CBC WITH DIFFERENTIAL/PLATELET
Abs Immature Granulocytes: 0.03 10*3/uL (ref 0.00–0.07)
Basophils Absolute: 0 10*3/uL (ref 0.0–0.1)
Basophils Relative: 1 %
Eosinophils Absolute: 0.2 10*3/uL (ref 0.0–0.5)
Eosinophils Relative: 6 %
HCT: 30.7 % — ABNORMAL LOW (ref 39.0–52.0)
Hemoglobin: 9.5 g/dL — ABNORMAL LOW (ref 13.0–17.0)
Immature Granulocytes: 1 %
Lymphocytes Relative: 34 %
Lymphs Abs: 1.2 10*3/uL (ref 0.7–4.0)
MCH: 25.1 pg — ABNORMAL LOW (ref 26.0–34.0)
MCHC: 30.9 g/dL (ref 30.0–36.0)
MCV: 81 fL (ref 80.0–100.0)
Monocytes Absolute: 0.5 10*3/uL (ref 0.1–1.0)
Monocytes Relative: 15 %
Neutro Abs: 1.5 10*3/uL — ABNORMAL LOW (ref 1.7–7.7)
Neutrophils Relative %: 43 %
Platelets: 246 10*3/uL (ref 150–400)
RBC: 3.79 MIL/uL — ABNORMAL LOW (ref 4.22–5.81)
RDW: 14.7 % (ref 11.5–15.5)
WBC: 3.5 10*3/uL — ABNORMAL LOW (ref 4.0–10.5)
nRBC: 0 % (ref 0.0–0.2)

## 2020-01-03 LAB — BASIC METABOLIC PANEL
Anion gap: 9 (ref 5–15)
BUN: 14 mg/dL (ref 6–20)
CO2: 25 mmol/L (ref 22–32)
Calcium: 8.2 mg/dL — ABNORMAL LOW (ref 8.9–10.3)
Chloride: 101 mmol/L (ref 98–111)
Creatinine, Ser: 1.29 mg/dL — ABNORMAL HIGH (ref 0.61–1.24)
GFR, Estimated: >60 mL/min (ref >60)
Glucose, Bld: 135 mg/dL — ABNORMAL HIGH (ref 70–99)
Potassium: 3.6 mmol/L (ref 3.5–5.1)
Sodium: 135 mmol/L (ref 135–145)

## 2020-01-03 LAB — GLUCOSE, CAPILLARY
Glucose-Capillary: 110 mg/dL — ABNORMAL HIGH (ref 70–99)
Glucose-Capillary: 94 mg/dL (ref 70–99)
Glucose-Capillary: 121 mg/dL — ABNORMAL HIGH (ref 70–99)
Glucose-Capillary: 127 mg/dL — ABNORMAL HIGH (ref 70–99)

## 2020-01-03 LAB — MAGNESIUM: Magnesium: 2.1 mg/dL (ref 1.7–2.4)

## 2020-01-03 LAB — SARS CORONAVIRUS 2 BY RT PCR (HOSPITAL ORDER, PERFORMED IN ~~LOC~~ HOSPITAL LAB): SARS Coronavirus 2: NEGATIVE

## 2020-01-03 MED ORDER — PANTOPRAZOLE SODIUM 40 MG PO TBEC
40.0000 mg | DELAYED_RELEASE_TABLET | Freq: Every day | ORAL | Status: DC
  Administered 2020-01-04 – 2020-01-16 (×13): 40 mg via ORAL
  Filled 2020-01-03 (×13): qty 1

## 2020-01-03 MED ORDER — POTASSIUM CHLORIDE 10 MEQ/100ML IV SOLN
10.0000 meq | INTRAVENOUS | Status: AC
  Administered 2020-01-03 (×4): 10 meq via INTRAVENOUS
  Filled 2020-01-03 (×3): qty 100

## 2020-01-03 MED ORDER — POTASSIUM CHLORIDE CRYS ER 20 MEQ PO TBCR
40.0000 meq | EXTENDED_RELEASE_TABLET | ORAL | Status: DC
  Administered 2020-01-03: 40 meq via ORAL
  Filled 2020-01-03: qty 2

## 2020-01-03 MED ORDER — POTASSIUM CHLORIDE 10 MEQ/100ML IV SOLN: 10.0000 meq | INTRAVENOUS | Status: DC

## 2020-01-03 MED ORDER — FUROSEMIDE 10 MG/ML IJ SOLN
20.0000 mg | Freq: Two times a day (BID) | INTRAMUSCULAR | Status: DC
  Administered 2020-01-03 (×2): 20 mg via INTRAVENOUS
  Filled 2020-01-03 (×2): qty 2

## 2020-01-03 MED ORDER — BENAZEPRIL HCL 5 MG PO TABS
10.0000 mg | ORAL_TABLET | Freq: Every day | ORAL | Status: DC
  Administered 2020-01-04: 10 mg via ORAL
  Filled 2020-01-03: qty 2

## 2020-01-03 NOTE — Telephone Encounter (Signed)
Sent to PCP ?

## 2020-01-03 NOTE — Progress Notes (Signed)
PROGRESS NOTE    [name]  NLG:[id number] DOB: 02/28/1960 DOA: 12/31/2019  PCP: [name], NP    Brief Narrative:  HPI: [name] is a 59 y.o. male with medical history significant for hypertension, poorly controlled diabetes mellitus, chronic leukopenia, history of CVA, and history of substance abuse, now presenting to the emergency department for evaluation of aches.  Patient reports that he recently moved to the area from [place] to be closer to family, has not taken any of his medicines in a couple days at least, and has developed cramping pain mainly in his legs.  He denies any recent substance abuse, denies any alcohol use, denies fevers, chills, or chest pain, but presents for evaluation of worsening leg cramps and also hopes to get help with nursing home placement.  ED Course: Upon arrival to the ED, patient is found to be afebrile, saturating well on room air, and hypertensive to 220/120.  EKG features sinus rhythm with LVH.  Chest x-ray is notable for chronic cardiomegaly but no acute findings.  Chemistry panel features a potassium of 2.5 and glucose 252.  CBC with chronic mild leukopenia and slight normocytic anemia.  BNP is slightly elevated.  COVID-19 screening test is negative.  Serum CK is 970.  Patient was given 20 mEq of potassium in the ED.   Assessment & Plan:  Hypertension Assessment & Plan Blood pressure 126/90, pulse 65, temperature 97.6 F (36.4 C), temperature source Oral, resp. rate 18, height 5\' 8"  (1.727 m), weight 127.7 kg, SpO2 99 %. Lab is good. We will continue diltiazem, benazepril 40, atenolol 25 mg and cont hydralazine prn.   01/02/20 Blood pressure (!) 145/91, pulse 67, temperature 98 F (36.7 C), temperature source Axillary, resp. rate 20, height 5\' 8"  (1.727 m), weight 125.8 kg, SpO2 98 %. will add hydralazine 25 mg tid in addition to his other meds.   01/03/20 Blood pressure (!) 145/91, pulse 67, temperature 98 F (36.7 C),  temperature source Axillary, resp. rate 20, height 5\' 8"  (1.727 m), weight 125.8 kg, SpO2 98 %. Held hydralazine to allow lasix and room for diuresis and decreased benazepril.   Non-traumatic rhabdomyolysis Assessment & Plan Po hydration and HD.   Anemia Assessment & Plan We will follow and transfuse if needed. suspect Acd from renal disease. Current hb is 9.9 Stable.   Grade I diastolic dysfunction Assessment & Plan Echocardiogram pending.  Echo shows: 1. Left ventricular ejection fraction, by estimation, is 65 to 70%. The left ventricle has normal function. The left ventricle has no regional wall motion abnormalities. There is moderate left ventricular hypertrophy. Hyperdense myocardium, consider hypertensive cardiomyopathy versus infilrative cardiomyopathy. Left ventricular diastolic parameters are consistent with Grade I diastolic dysfunction (impaired relaxation). 2. Right ventricular systolic function is normal. The right ventricular size is normal. There is mildly elevated pulmonary artery systolic pressure. The estimated right ventricular systolic pressure is 08.1 mmHg. 3. Left atrial size was mildly dilated. 4. A small pericardial effusion is present. The pericardial effusion is circumferential. 5. The mitral valve is grossly normal. Mild mitral valve regurgitation. 6. The aortic valve is functionally bicuspid. There is moderate calcification of the aortic valve. Aortic valve regurgitation is trivial. Moderate aortic valve stenosis. Aortic valve mean gradient measures 13.0 mmHg. Aortic valve Vmax measures 2.37 m/s. Dimentionless index 0.32. 7. The inferior vena cava is dilated in size with <50% respiratory variability, suggesting right atrial pressure of 15 mmHg.  Hypokalemia Assessment & Plan Pt given potassium we will  check and replace . Potassium is still low at 2.7 he has been give 40 meq iv and repeat level is pending.  Cont potassium  supplementation.   Substance-related disorder [name]) Assessment & Plan Counseling when he is awake but current uds negative.   Generalized body aches Assessment & Plan As reflected by elevated cpk suspect mild rhabdo, nontraumatic.  We will encourage po hydration.  Diarrhea: Assessment & Plan Received reports from nurse about pt having diarrhea and sample has not been collected. Pt in isolation for C.Diff. Resolved.   LE edema:  DVT prophylaxis: lovenox Code Status: full Family Communication: none  Disposition Plan: TBD  Status is: Observation  The patient will require care spanning > 2 midnights and should be moved to inpatient because: Inpatient level of care appropriate due to severity of illness  Dispo: The patient is from: TBD              Anticipated d/c is to: TBD              Anticipated d/c date is: 3 days              Patient currently is not medically stable to d/c.  Consultants:  nephrology  Procedures:  HD ECHO   Subjective: Pt is drowsy and wake up and answers questions appropriately.   Pt is alert today and denies any complaints says he did not have diarrhea. Labs still show hypokalemia and hemoglobin is 9.3.  Pt is alert, his edema that was 1 + yesterday is 2+ today and he notices it as well. States he feels off balance while sitting and trying to sit up in bed.  D/w him that his potassium is low and we have to give him lasix for swelling and also rule out blood clot. Echo reports as about pt can get cardiac mri as outpatient as he is asymptomatic.    Objective: Vitals:   01/03/20 0022 01/03/20 0338 01/03/20 0900 01/03/20 0936  BP:    (!) 145/91  Pulse: 70 70 68 67  Resp: 18 20 20 20   Temp: 98 F (36.7 C) 98 F (36.7 C)    TempSrc: Oral Axillary    SpO2: 94% 98%  98%  Weight:  125.8 kg    Height:       SpO2: 98 %  Intake/Output Summary (Last 24 hours) at 01/03/2020 1645 Last data filed at 01/03/2020 1524 Gross per 24 hour  Intake  552.25 ml  Output 100 ml  Net 452.25 ml   Filed Weights   01/01/20 0834 01/02/20 0605 01/03/20 0338  Weight: 127.7 kg 125.8 kg 125.8 kg    Examination: Blood pressure (!) 145/91, pulse 67, temperature 98 F (36.7 C), temperature source Axillary, resp. rate 20, height 5\' 8"  (1.727 m), weight 125.8 kg, SpO2 98 %. General exam: Appears calm and comfortable  HEENT:EOMI, perrl  Respiratory system: Clear to auscultation. Respiratory effort normal. Cardiovascular system: S1 & S2 heard, RRR. No JVD, murmurs, rubs, gallops or clicks. 2 +pedal edema. Gastrointestinal system: Abdomen is nondistended, soft and nontender. No organomegaly or masses felt. Normal bowel sounds heard. Central nervous system: Alert and oriented.CN grossly intact No focal neurological deficits. Extremities: pt moving all 4 ext and ambulating. Skin: No rashes, lesions or ulcers Psychiatry: Judgement and insight appear normal. Mood & affect appropriate.   Data Reviewed: I have personally reviewed following labs and imaging studies  I/O last 3 completed shifts: In: 1145.6 [P.O.:850; I.V.:142.3; IV Piggyback:153.3] Out: 400 [Urine:400]  Total I/O In: 10 [I.V.:10] Out: 100 [Urine:100] Lab Results  Component Value Date   CREATININE 1.30 (H) 01/03/2020   CREATININE 1.42 (H) 01/02/2020   CREATININE 1.38 (H) 01/02/2020   CBC: Recent Labs  Lab 12/31/19 2122 01/01/20 0253 01/02/20 0750 01/03/20 0357  WBC 3.5* 3.7* 3.5* 3.5*  NEUTROABS 1.8 1.8 1.3* 1.5*  HGB 11.0* 9.9* 9.3* 9.5*  HCT 34.6* 31.7* 30.2* 30.7*  MCV 81.4 80.9 81.4 81.0  PLT 266 254 222 035   Basic Metabolic Panel: Recent Labs  Lab 12/31/19 2122 01/01/20 0045 01/01/20 0253 01/02/20 0750 01/02/20 1618 01/03/20 0357  NA 136  --  138 138 133* 134*  K 2.5*  --  2.6* 2.7* 3.0* 2.9*  CL 102  --  104 105 102 103  CO2 24  --  25 24 23 23   GLUCOSE 252*  --  221* 99 146* 186*  BUN 10  --  9 13 13 14   CREATININE 1.07  --  1.08 1.38* 1.42* 1.30*   CALCIUM 8.4*  --  8.0* 8.0* 8.1* 7.9*  MG  --  1.9  --   --   --  2.1   GFR: Estimated Creatinine Clearance: 80.1 mL/min (A) (by C-G formula based on SCr of 1.3 mg/dL (H)). Liver Function Tests: Recent Labs  Lab 12/31/19 2122 01/03/20 0357  AST 28 22  ALT 18 14  ALKPHOS 90 73  BILITOT 0.6 0.4  PROT 5.8* 5.0*  ALBUMIN 2.2* 1.8*   No results for input(s): LIPASE, AMYLASE in the last 168 hours. No results for input(s): AMMONIA in the last 168 hours.  Coagulation Profile: No results for input(s): INR, PROTIME in the last 168 hours. Cardiac Enzymes: Recent Labs  Lab 12/31/19 2122 01/02/20 0750  CKTOTAL 970* 568*   BNP (last 3 results) No results for input(s): PROBNP in the last 8760 hours. HbA1C: No results for input(s): HGBA1C in the last 72 hours. CBG: Recent Labs  Lab 01/02/20 1120 01/02/20 1542 01/02/20 2059 01/03/20 0740 01/03/20 1121  GLUCAP 129* 114* 197* 110* 94   Lipid Profile: No results for input(s): CHOL, HDL, LDLCALC, TRIG, CHOLHDL, LDLDIRECT in the last 72 hours. Thyroid Function Tests: Recent Labs    01/01/20 0849  TSH 1.354  FREET4 1.24*   Anemia Panel: Recent Labs    01/01/20 0849 01/02/20 1618  VITAMINB12 201 278  FOLATE 10.0 8.7  FERRITIN 107 100  TIBC 237* 245*  IRON 32* 29*  RETICCTPCT 1.6 1.8   Sepsis Labs: Recent Labs  Lab 01/02/20 1618  LATICACIDVEN 1.2    Recent Results (from the past 240 hour(s))  Respiratory Panel by RT PCR (Flu A&B, Covid) - Nasopharyngeal Swab     Status: None   Collection Time: 12/31/19  2:00 PM   Specimen: Nasopharyngeal Swab  Result Value Ref Range Status   SARS Coronavirus 2 by RT PCR NEGATIVE NEGATIVE Final    Comment: (NOTE) SARS-CoV-2 target nucleic acids are NOT DETECTED.  The SARS-CoV-2 RNA is generally detectable in upper respiratoy specimens during the acute phase of infection. The lowest concentration of SARS-CoV-2 viral copies this assay can detect is 131 copies/mL. A negative  result does not preclude SARS-Cov-2 infection and should not be used as the sole basis for treatment or other patient management decisions. A negative result may occur with  improper specimen collection/handling, submission of specimen other than nasopharyngeal swab, presence of viral mutation(s) within the areas targeted by this assay, and inadequate number of viral copies (<  131 copies/mL). A negative result must be combined with clinical observations, patient history, and epidemiological information. The expected result is Negative.  Fact Sheet for Patients:  PinkCheek.be  Fact Sheet for Healthcare Providers:  GravelBags.it  This test is no t yet approved or cleared by the [place] FDA and  has been authorized for detection and/or diagnosis of SARS-CoV-2 by FDA under an Emergency Use Authorization (EUA). This EUA will remain  in effect (meaning this test can be used) for the duration of the COVID-19 declaration under Section 564(b)(1) of the Act, 21 U.S.C. section 360bbb-3(b)(1), unless the authorization is terminated or revoked sooner.     Influenza A by PCR NEGATIVE NEGATIVE Final   Influenza B by PCR NEGATIVE NEGATIVE Final    Comment: (NOTE) The Xpert Xpress SARS-CoV-2/FLU/RSV assay is intended as an aid in  the diagnosis of influenza from Nasopharyngeal swab specimens and  should not be used as a sole basis for treatment. Nasal washings and  aspirates are unacceptable for Xpert Xpress SARS-CoV-2/FLU/RSV  testing.  Fact Sheet for Patients: PinkCheek.be  Fact Sheet for Healthcare Providers: GravelBags.it  This test is not yet approved or cleared by the [place] FDA and  has been authorized for detection and/or diagnosis of SARS-CoV-2 by  FDA under an Emergency Use Authorization (EUA). This EUA will remain  in effect (meaning this test can be used)  for the duration of the  Covid-19 declaration under Section 564(b)(1) of the Act, 21  U.S.C. section 360bbb-3(b)(1), unless the authorization is  terminated or revoked. Performed at [facility], [place] [address], [place], [place] [address]       Radiology Studies: DG Chest 2 View  Result Date: 12/31/2019 CLINICAL DATA:  Shortness of breath.  Generalized body aches. EXAM: CHEST - 2 VIEW COMPARISON:  Radiograph 12/25/2019.  CT 12/12/2019 FINDINGS: Improved lung volumes from prior exam. Chronic cardiomegaly. No pulmonary edema. No focal airspace disease. Trace fluid in the fissures without significant sub pulmonic effusion. No confluent consolidation. No pneumothorax. No acute osseous abnormalities are seen. IMPRESSION: Chronic cardiomegaly. No acute chest findings. Electronically Signed   By: Keith Rake M.D.   On: 12/31/2019 20:43   ECHOCARDIOGRAM COMPLETE  Result Date: 01/01/2020    ECHOCARDIOGRAM REPORT   Patient Name:   JAQUILLE KAU Date of Exam: 01/01/2020 Medical Rec #:  [id number]     Height:       68.0 in Accession #:    [id number]    Weight:       281.5 lb Date of Birth:  Jul 17, 1960      BSA:          2.363 m Patient Age:    9 years      BP:           151/94 mmHg Patient Gender: M             HR:           71 bpm. Exam Location:  Inpatient Procedure: 2D Echo Indications:    hypertensive urgency  History:        Patient has prior history of Echocardiogram examinations, most                 recent 09/29/2017. Stroke; Risk Factors:Hypertension and Diabetes.  Sonographer:    Johny Chess Referring Phys: [place]   1. Left ventricular ejection fraction, by estimation, is 65 to 70%.  The left ventricle has normal  function.  The left ventricle has no regional wall motion abnormalities.  There is moderate left ventricular hypertrophy.  Hyperdense myocardium, consider hypertensive cardiomyopathy versus infilrative cardiomyopathy.  Left ventricular diastolic  parameters are consistent with Grade I diastolic dysfunction (impaired relaxation).   2. Right ventricular systolic function is normal.  The right ventricular size is normal. There is mildly elevated pulmonary artery systolic pressure. The estimated right ventricular systolic pressure is 30.1 mmHg.   3. Left atrial size was mildly dilated.   4. A small pericardial effusion is present. The pericardial effusion is circumferential.   5. The mitral valve is grossly normal. Mild mitral valve regurgitation.   6. The aortic valve is functionally bicuspid. There is moderate calcification of the aortic valve. Aortic valve regurgitation is trivial. Moderate aortic valve stenosis. Aortic valve mean gradient measures 13.0 mmHg. Aortic valve Vmax measures 2.37 m/s.  Dimentionless index 0.32.  7. The inferior vena cava is dilated in size with <50% respiratory variability, suggesting right atrial pressure of 15 mmHg.   FINDINGS  Left Ventricle: Left ventricular ejection fraction, by estimation, is 65 to 70%. The left ventricle has normal function. The left ventricle has no regional wall motion abnormalities. The left ventricular internal cavity size was normal in size. There is  moderate left ventricular hypertrophy. Left ventricular diastolic parameters are consistent with Grade I diastolic dysfunction (impaired relaxation). Right Ventricle: The right ventricular size is normal. No increase in right ventricular wall thickness. Right ventricular systolic function is normal. There is mildly elevated pulmonary artery systolic pressure. The tricuspid regurgitant velocity is 2.66  m/s, and with an assumed right atrial pressure of 15 mmHg, the estimated right ventricular systolic pressure is 60.1 mmHg. Left Atrium: Left atrial size was mildly dilated. Right Atrium: Right atrial size was normal in size. Pericardium: A small pericardial effusion is present. The pericardial effusion is circumferential. Mitral Valve: The mitral  valve is grossly normal. Mild mitral valve regurgitation. Tricuspid Valve: The tricuspid valve is grossly normal. Tricuspid valve regurgitation is mild. Aortic Valve: The aortic valve is bicuspid. There is moderate calcification of the aortic valve. There is mild to moderate aortic valve annular calcification. Aortic valve regurgitation is trivial. Moderate aortic stenosis is present. Aortic valve mean gradient measures 13.0 mmHg. Aortic valve peak gradient measures 22.5 mmHg. Aortic valve area, by VTI measures 1.21 cm. Pulmonic Valve: The pulmonic valve was grossly normal. Pulmonic valve regurgitation is trivial. Aorta: The aortic root is normal in size and structure. Venous: The inferior vena cava is dilated in size with less than 50% respiratory variability, suggesting right atrial pressure of 15 mmHg. IAS/Shunts: No atrial level shunt detected by color flow Doppler.  LEFT VENTRICLE PLAX 2D LVIDd:         4.80 cm  Diastology LVIDs:         3.20 cm  LV e' medial:    5.44 cm/s LV PW:         1.30 cm  LV E/e' medial:  12.9 LV IVS:        1.30 cm  LV e' lateral:   6.31 cm/s LVOT diam:     2.20 cm  LV E/e' lateral: 11.1 LV SV:         58 LV SV Index:   25 LVOT Area:     3.80 cm  RIGHT VENTRICLE             IVC RV S prime:     14.90 cm/s  IVC diam: 2.70  cm TAPSE (M-mode): 2.7 cm LEFT ATRIUM             Index       RIGHT ATRIUM           Index LA diam:        4.40 cm 1.86 cm/m  RA Area:     16.10 cm LA Vol (A2C):   57.0 ml 24.12 ml/m RA Volume:   41.60 ml  17.60 ml/m LA Vol (A4C):   87.9 ml 37.19 ml/m LA Biplane Vol: 73.9 ml 31.27 ml/m  AORTIC VALVE AV Area (Vmax):    1.30 cm AV Area (Vmean):   1.19 cm AV Area (VTI):     1.21 cm AV Vmax:           237.00 cm/s AV Vmean:          165.000 cm/s AV VTI:            0.482 m AV Peak Grad:      22.5 mmHg AV Mean Grad:      13.0 mmHg LVOT Vmax:         80.90 cm/s LVOT Vmean:        51.800 cm/s LVOT VTI:          0.153 m LVOT/AV VTI ratio: 0.32  AORTA Ao Root diam: 3.10  cm Ao Asc diam:  3.70 cm MITRAL VALVE               TRICUSPID VALVE MV Area (PHT): 3.77 cm    TR Peak grad:   28.3 mmHg MV Decel Time: 201 msec    TR Vmax:        266.00 cm/s MV E velocity: 70.30 cm/s MV A velocity: 85.30 cm/s  SHUNTS MV E/A ratio:  0.82        Systemic VTI:  0.15 m                            Systemic Diam: 2.20 cm Rozann Lesches MD Electronically signed by Rozann Lesches MD Signature Date/Time: 01/01/2020/1:40:54 PM    Final     Current Facility-Administered Medications (Endocrine & Metabolic):  .  insulin aspart (novoLOG) injection 0-5 Units .  insulin aspart (novoLOG) injection 0-9 Units .  insulin glargine (LANTUS) injection 40 Units   Current Facility-Administered Medications (Cardiovascular):  .  atenolol (TENORMIN) tablet 25 mg .  benazepril (LOTENSIN) tablet 40 mg .  diltiazem (CARDIZEM CD) 24 hr capsule 180 mg .  hydrALAZINE (APRESOLINE) tablet 50 mg     Current Facility-Administered Medications (Analgesics):  .  acetaminophen (TYLENOL) tablet 650 mg **OR** acetaminophen (TYLENOL) suppository 650 mg    Current Facility-Administered Medications (Hematological):  Marland Kitchen  [START ON 01/02/2020] enoxaparin (LOVENOX) injection 65 mg   Current Facility-Administered Medications (Other):  .  0.9 %  sodium chloride infusion .  gabapentin (NEURONTIN) capsule 800 mg .  magnesium oxide (MAG-OX) tablet 400 mg .  pantoprazole (PROTONIX) EC tablet 40 mg .  polyethylene glycol (MIRALAX / GLYCOLAX) packet 17 g .  sodium chloride flush (NS) 0.9 % injection 3 mL .  sodium chloride flush (NS) 0.9 % injection 3 mL .  sodium chloride flush (NS) 0.9 % injection 3 mL  No current outpatient medications on file.  Anti-infectives (From admission, onward)   None      Scheduled Meds: . atenolol  25 mg Oral Daily  . [START ON 01/04/2020] benazepril  10 mg Oral  Daily  . diltiazem  180 mg Oral Daily  . enoxaparin (LOVENOX) injection  65 mg Subcutaneous Daily  . furosemide  20 mg  Intravenous Q12H  . gabapentin  800 mg Oral BID  . insulin aspart  0-5 Units Subcutaneous QHS  . insulin aspart  0-9 Units Subcutaneous TID WC  . insulin glargine  25 Units Subcutaneous BID  . [START ON 01/04/2020] pantoprazole  40 mg Oral Daily  . sodium chloride flush  3 mL Intravenous Q12H  . sodium chloride flush  3 mL Intravenous Q12H   Continuous Infusions: . sodium chloride    . potassium chloride 10 mEq (01/03/20 1524)     LOS: 0 days    Para Skeans, MD [facility] Pager [phone] If 7PM-7AM, please contact night-coverage [email or web address] Password [facility] 01/03/2020, 4:45 PM

## 2020-01-03 NOTE — Progress Notes (Signed)
Inpatient Diabetes Program Recommendations  AACE/ADA: New Consensus Statement on Inpatient Glycemic Control (2015)  Target Ranges:  Prepandial:   less than 140 mg/dL      Peak postprandial:   less than 180 mg/dL (1-2 hours)      Critically ill patients:  140 - 180 mg/dL   Lab Results  Component Value Date   GLUCAP 94 01/03/2020   HGBA1C 10.6 (A) 12/29/2019    Review of Glycemic Control Results for LATRAVIS, GRINE (MRN [id number]) as of 01/03/2020 12:04  Ref. Range 01/02/2020 15:42 01/02/2020 20:59 01/03/2020 07:40 01/03/2020 11:21  Glucose-Capillary Latest Ref Range: 70 - 99 mg/dL 114 (H) 197 (H) 110 (H) 94   Diabetes history: Type 2 DM Outpatient Diabetes medications: Lantus 40 units QHS, Janumet 50-1000 mg BID Current orders for Inpatient glycemic control: Lantus 25 units BID, Novolog 0-9 units TID, Novolog 0-5 units QHS  Inpatient Diabetes Program Recommendations:    Spoke with patient regarding outpatient diabetes management. Patient recently moved from [place] to be closer to family, however, forgot to pack medications. Has not been able to take medications in several weeks. Has hypoglycemic awareness with CBGs < 120 mg/dLs. Was recently seen at [facility] on 11/3 and now how home medications. Reviewed patient's current A1c of 10.6%. Explained what a A1c is and what it measures. Also reviewed goal A1c with patient, importance of good glucose control @ home, and blood sugar goals. Reviewed importance of DM control, need for insulin, hypoglycemia awareness, need for improved control, vascular changes and commorbidities.  Patient will need a meter at discharge. Blood glucose meter kit (inlcudes lancets and strips) (#[id number]). Reviewed recommended frequency for CBG checks and when to call MD. Patient plans to follow up with [facility].  Has no further questions at this time.   Thanks, Bronson Curb, MSN, RNC-OB Diabetes Coordinator [phone] (8a-5p)

## 2020-01-03 NOTE — Progress Notes (Signed)
Bilateral lower extremity venous duplex has been completed. Preliminary results can be found in CV Proc through chart review.   01/03/20 4:36 PM Carlos Levering RVT

## 2020-01-03 NOTE — TOC Progression Note (Addendum)
Transition of Care [facility]) - Progression Note    Patient Details  Name: AROLDO GALLI MRN: [id number] Date of Birth: 23-Dec-1960  Transition of Care [facility]) CM/SW [place], [place] Phone Number: 01/03/2020, 12:56 PM  Clinical Narrative:     Update 11/8 1:05pm Janie with [facility] confirmed they can accept patient for SNF placement. CSW called patients insurance to update them with SNF choice. Insurance authorization has been approved. Insurance authorization [employer] ID# [id number]. Next review date is 11/10.  Patient has SNF bed at [facility]. Insurance authorization has been approved.  CSW will continue to follow.   CSW spoke with patient at bedside. CSW provided SNF bed offers to patient. Patient chose SNF placement at [facility]. CSW awaiting confirmation with Narda Rutherford that they can accept patient for SNF placement.    Expected Discharge Plan: [place] Barriers to Discharge: Continued Medical Work up  Expected Discharge Plan and Services Expected Discharge Plan: [place] Choice: [place] arrangements for the past 2 months: Single Family Home                                       Social Determinants of Health (SDOH) Interventions    Readmission Risk Interventions No flowsheet data found.

## 2020-01-03 NOTE — Progress Notes (Signed)
   k-2.9, paged hospitalist on call to notify, will continue to monitor closely.

## 2020-01-04 ENCOUNTER — Inpatient Hospital Stay (HOSPITAL_COMMUNITY): Payer: Medicare HMO

## 2020-01-04 DIAGNOSIS — G9341 Metabolic encephalopathy: Secondary | ICD-10-CM | POA: Diagnosis not present

## 2020-01-04 DIAGNOSIS — E1121 Type 2 diabetes mellitus with diabetic nephropathy: Secondary | ICD-10-CM | POA: Diagnosis present

## 2020-01-04 DIAGNOSIS — M6282 Rhabdomyolysis: Secondary | ICD-10-CM | POA: Diagnosis not present

## 2020-01-04 DIAGNOSIS — N1831 Chronic kidney disease, stage 3a: Secondary | ICD-10-CM | POA: Diagnosis not present

## 2020-01-04 DIAGNOSIS — R601 Generalized edema: Secondary | ICD-10-CM | POA: Diagnosis not present

## 2020-01-04 DIAGNOSIS — N179 Acute kidney failure, unspecified: Secondary | ICD-10-CM | POA: Diagnosis not present

## 2020-01-04 DIAGNOSIS — Z66 Do not resuscitate: Secondary | ICD-10-CM | POA: Diagnosis not present

## 2020-01-04 DIAGNOSIS — Z20822 Contact with and (suspected) exposure to covid-19: Secondary | ICD-10-CM | POA: Diagnosis not present

## 2020-01-04 DIAGNOSIS — E1140 Type 2 diabetes mellitus with diabetic neuropathy, unspecified: Secondary | ICD-10-CM | POA: Diagnosis present

## 2020-01-04 DIAGNOSIS — Z7189 Other specified counseling: Secondary | ICD-10-CM | POA: Diagnosis not present

## 2020-01-04 DIAGNOSIS — E1122 Type 2 diabetes mellitus with diabetic chronic kidney disease: Secondary | ICD-10-CM | POA: Diagnosis present

## 2020-01-04 DIAGNOSIS — D72819 Decreased white blood cell count, unspecified: Secondary | ICD-10-CM | POA: Diagnosis present

## 2020-01-04 DIAGNOSIS — E876 Hypokalemia: Secondary | ICD-10-CM | POA: Diagnosis not present

## 2020-01-04 DIAGNOSIS — I5189 Other ill-defined heart diseases: Secondary | ICD-10-CM | POA: Diagnosis not present

## 2020-01-04 DIAGNOSIS — N1832 Chronic kidney disease, stage 3b: Secondary | ICD-10-CM | POA: Diagnosis present

## 2020-01-04 DIAGNOSIS — N049 Nephrotic syndrome with unspecified morphologic changes: Secondary | ICD-10-CM | POA: Diagnosis not present

## 2020-01-04 DIAGNOSIS — I130 Hypertensive heart and chronic kidney disease with heart failure and stage 1 through stage 4 chronic kidney disease, or unspecified chronic kidney disease: Secondary | ICD-10-CM | POA: Diagnosis not present

## 2020-01-04 DIAGNOSIS — E440 Moderate protein-calorie malnutrition: Secondary | ICD-10-CM | POA: Diagnosis not present

## 2020-01-04 DIAGNOSIS — R52 Pain, unspecified: Secondary | ICD-10-CM | POA: Diagnosis not present

## 2020-01-04 DIAGNOSIS — N170 Acute kidney failure with tubular necrosis: Secondary | ICD-10-CM | POA: Diagnosis not present

## 2020-01-04 DIAGNOSIS — I161 Hypertensive emergency: Secondary | ICD-10-CM | POA: Diagnosis not present

## 2020-01-04 DIAGNOSIS — E872 Acidosis: Secondary | ICD-10-CM | POA: Diagnosis not present

## 2020-01-04 DIAGNOSIS — Z515 Encounter for palliative care: Secondary | ICD-10-CM | POA: Diagnosis not present

## 2020-01-04 DIAGNOSIS — F1999 Other psychoactive substance use, unspecified with unspecified psychoactive substance-induced disorder: Secondary | ICD-10-CM | POA: Diagnosis not present

## 2020-01-04 DIAGNOSIS — N189 Chronic kidney disease, unspecified: Secondary | ICD-10-CM | POA: Diagnosis not present

## 2020-01-04 DIAGNOSIS — E1165 Type 2 diabetes mellitus with hyperglycemia: Secondary | ICD-10-CM | POA: Diagnosis present

## 2020-01-04 DIAGNOSIS — E870 Hyperosmolality and hypernatremia: Secondary | ICD-10-CM | POA: Diagnosis not present

## 2020-01-04 DIAGNOSIS — N184 Chronic kidney disease, stage 4 (severe): Secondary | ICD-10-CM | POA: Diagnosis not present

## 2020-01-04 DIAGNOSIS — R140 Abdominal distension (gaseous): Secondary | ICD-10-CM | POA: Diagnosis not present

## 2020-01-04 DIAGNOSIS — R338 Other retention of urine: Secondary | ICD-10-CM | POA: Diagnosis not present

## 2020-01-04 DIAGNOSIS — I10 Essential (primary) hypertension: Secondary | ICD-10-CM | POA: Diagnosis not present

## 2020-01-04 DIAGNOSIS — N401 Enlarged prostate with lower urinary tract symptoms: Secondary | ICD-10-CM | POA: Diagnosis not present

## 2020-01-04 DIAGNOSIS — K567 Ileus, unspecified: Secondary | ICD-10-CM | POA: Diagnosis not present

## 2020-01-04 DIAGNOSIS — K5939 Other megacolon: Secondary | ICD-10-CM | POA: Diagnosis present

## 2020-01-04 DIAGNOSIS — Q231 Congenital insufficiency of aortic valve: Secondary | ICD-10-CM | POA: Diagnosis not present

## 2020-01-04 DIAGNOSIS — I5033 Acute on chronic diastolic (congestive) heart failure: Secondary | ICD-10-CM | POA: Diagnosis not present

## 2020-01-04 LAB — CBC WITH DIFFERENTIAL/PLATELET
Abs Immature Granulocytes: 0.02 10*3/uL (ref 0.00–0.07)
Basophils Absolute: 0 10*3/uL (ref 0.0–0.1)
Basophils Relative: 0 %
Eosinophils Absolute: 0.2 10*3/uL (ref 0.0–0.5)
Eosinophils Relative: 5 %
HCT: 31.4 % — ABNORMAL LOW (ref 39.0–52.0)
Hemoglobin: 9.7 g/dL — ABNORMAL LOW (ref 13.0–17.0)
Immature Granulocytes: 1 %
Lymphocytes Relative: 35 %
Lymphs Abs: 1.2 10*3/uL (ref 0.7–4.0)
MCH: 25.3 pg — ABNORMAL LOW (ref 26.0–34.0)
MCHC: 30.9 g/dL (ref 30.0–36.0)
MCV: 81.8 fL (ref 80.0–100.0)
Monocytes Absolute: 0.5 10*3/uL (ref 0.1–1.0)
Monocytes Relative: 15 %
Neutro Abs: 1.5 10*3/uL — ABNORMAL LOW (ref 1.7–7.7)
Neutrophils Relative %: 44 %
Platelets: 257 10*3/uL (ref 150–400)
RBC: 3.84 MIL/uL — ABNORMAL LOW (ref 4.22–5.81)
RDW: 15 % (ref 11.5–15.5)
WBC: 3.4 10*3/uL — ABNORMAL LOW (ref 4.0–10.5)
nRBC: 0 % (ref 0.0–0.2)

## 2020-01-04 LAB — COMPREHENSIVE METABOLIC PANEL
ALT: 14 U/L (ref 0–44)
AST: 21 U/L (ref 15–41)
Albumin: 1.8 g/dL — ABNORMAL LOW (ref 3.5–5.0)
Alkaline Phosphatase: 72 U/L (ref 38–126)
Anion gap: 6 (ref 5–15)
BUN: 14 mg/dL (ref 6–20)
CO2: 26 mmol/L (ref 22–32)
Calcium: 8.2 mg/dL — ABNORMAL LOW (ref 8.9–10.3)
Chloride: 102 mmol/L (ref 98–111)
Creatinine, Ser: 1.32 mg/dL — ABNORMAL HIGH (ref 0.61–1.24)
GFR, Estimated: >60 mL/min (ref >60)
Glucose, Bld: 105 mg/dL — ABNORMAL HIGH (ref 70–99)
Potassium: 3.4 mmol/L — ABNORMAL LOW (ref 3.5–5.1)
Sodium: 134 mmol/L — ABNORMAL LOW (ref 135–145)
Total Bilirubin: 0.3 mg/dL (ref 0.3–1.2)
Total Protein: 5.4 g/dL — ABNORMAL LOW (ref 6.5–8.1)

## 2020-01-04 LAB — GLUCOSE, CAPILLARY
Glucose-Capillary: 84 mg/dL (ref 70–99)
Glucose-Capillary: 94 mg/dL (ref 70–99)
Glucose-Capillary: 69 mg/dL — ABNORMAL LOW (ref 70–99)
Glucose-Capillary: 186 mg/dL — ABNORMAL HIGH (ref 70–99)

## 2020-01-04 LAB — MAGNESIUM: Magnesium: 1.9 mg/dL (ref 1.7–2.4)

## 2020-01-04 LAB — PROTEIN / CREATININE RATIO, URINE
Creatinine, Urine: 95.23 mg/dL
Protein Creatinine Ratio: 3.51 mg/mg{Cre} — ABNORMAL HIGH (ref 0.00–0.15)
Total Protein, Urine: 334 mg/dL

## 2020-01-04 LAB — HEPATITIS B SURFACE ANTIGEN: Hepatitis B Surface Ag: NONREACTIVE

## 2020-01-04 LAB — HEPATITIS C ANTIBODY: HCV Ab: NONREACTIVE

## 2020-01-04 MED ORDER — SODIUM CHLORIDE 0.9 % IV SOLN: INTRAVENOUS | Status: DC

## 2020-01-04 MED ORDER — FUROSEMIDE 10 MG/ML IJ SOLN
80.0000 mg | Freq: Two times a day (BID) | INTRAMUSCULAR | Status: DC
  Administered 2020-01-04 – 2020-01-06 (×4): 80 mg via INTRAVENOUS
  Filled 2020-01-04 (×4): qty 8

## 2020-01-04 MED ORDER — MAGNESIUM SULFATE 2 GM/50ML IV SOLN
2.0000 g | Freq: Once | INTRAVENOUS | Status: AC
  Administered 2020-01-04: 2 g via INTRAVENOUS
  Filled 2020-01-04: qty 50

## 2020-01-04 MED ORDER — POTASSIUM CHLORIDE CRYS ER 20 MEQ PO TBCR: 40.0000 meq | EXTENDED_RELEASE_TABLET | Freq: Two times a day (BID) | ORAL | Status: DC

## 2020-01-04 MED ORDER — ALBUMIN HUMAN 25 % IV SOLN
25.0000 g | Freq: Four times a day (QID) | INTRAVENOUS | Status: DC
  Administered 2020-01-04: 25 g via INTRAVENOUS
  Filled 2020-01-04: qty 100

## 2020-01-04 MED ORDER — ALBUMIN HUMAN 25 % IV SOLN
25.0000 g | Freq: Four times a day (QID) | INTRAVENOUS | Status: AC
  Administered 2020-01-05 – 2020-01-06 (×4): 25 g via INTRAVENOUS
  Filled 2020-01-04 (×4): qty 100

## 2020-01-04 MED ORDER — POTASSIUM CHLORIDE CRYS ER 20 MEQ PO TBCR
30.0000 meq | EXTENDED_RELEASE_TABLET | Freq: Two times a day (BID) | ORAL | Status: AC
  Administered 2020-01-04 (×2): 30 meq via ORAL
  Filled 2020-01-04 (×2): qty 1

## 2020-01-04 MED ORDER — POTASSIUM CHLORIDE CRYS ER 20 MEQ PO TBCR
20.0000 meq | EXTENDED_RELEASE_TABLET | Freq: Every day | ORAL | Status: DC
  Administered 2020-01-05: 20 meq via ORAL
  Filled 2020-01-04: qty 1

## 2020-01-04 NOTE — Progress Notes (Signed)
Physical Therapy Treatment Patient Details Name: [name] MRN: [id number] DOB: [birth year] Today's Date: 01/04/2020    History of Present Illness 59 y.o. male with medical history significant for hypertension, poorly controlled diabetes mellitus, chronic leukopenia, history of CVA, and history of substance abuse, now presenting to the emergency department for evaluation of aches. He has not taken any of his medicines in a couple days at least, and has developed cramping pain mainly in his legs. Pt found to be hypertensive and hypokalemic.    PT Comments    Pt presents to PT sitting EOB. Pt showed decreased LE strength abilities during transfers, as shown by his decreased ability to stand with his trunk upright and initiate sit to stand from a level height. Pt was hesitant with unilateral weight acceptance during transfers and kept asking if the chair was locked and steady, which he was nervous about secondary to his hx of falls. Pt would benefit from skilled PT in the acute setting to continue addressing LE strengthening, bed mobility, transfers, and other functional abilities in order to perform ADLs.   Follow Up Recommendations  SNF;Supervision/Assistance - 24 hour     Equipment Recommendations  Hospital bed;Other (comment) (mechanical lift)    Recommendations for Other Services       Precautions / Restrictions Precautions Precautions: Fall Restrictions Weight Bearing Restrictions: No    Mobility  Bed Mobility               General bed mobility comments: pt sitting EOB on arrival  Transfers Overall transfer level: Needs assistance Equipment used: Rolling walker (2 wheeled) Transfers: Sit to/from [employer] Transfers Sit to Stand: From elevated surface;Max assist;+2 physical assistance         General transfer comment: pt unable to clear buttocks more than a couple inches from the bed and in a flexed posture over the walker the whole time; pt needed +2 PTs  to stand pivot transfer to the R and was only able to get halfway through the transfer before he had to sit down, with a lack of eccentric control, on his RT glute on the recliner; pt then required max assist +2 from PTs to scoot back in recliner  Ambulation/Gait                 Stairs             Wheelchair Mobility    Modified Rankin (Stroke Patients Only)       Balance Overall balance assessment: Needs assistance Sitting-balance support: No upper extremity supported;Feet supported Sitting balance-Leahy Scale: Fair     Standing balance support: Bilateral upper extremity supported;During functional activity Standing balance-Leahy Scale: Zero Standing balance comment: pt required BUE on the walker while max assist +2 PTs were on either side supporting from the gait belt; pt able to maintain static stance but total assist +2 needed for dynamic balance to complete stand pivot transfer                            Cognition Arousal/Alertness: Awake/alert Behavior During Therapy: WFL for tasks assessed/performed Overall Cognitive Status: Within Functional Limits for tasks assessed                                        Exercises      General Comments  Pertinent Vitals/Pain Pain Assessment: 0-10 Pain Score: 8  Pain Location: bilateral knees and back    Home Living                      Prior Function            PT Goals (current goals can now be found in the care plan section) Progress towards PT goals: Progressing toward goals    Frequency    Min 2X/week      PT Plan      Co-evaluation              AM-PAC PT "6 Clicks" Mobility   Outcome Measure  Help needed turning from your back to your side while in a flat bed without using bedrails?: A Lot Help needed moving from lying on your back to sitting on the side of a flat bed without using bedrails?: A Little Help needed moving to and from a bed to  a chair (including a wheelchair)?: Total Help needed standing up from a chair using your arms (e.g., wheelchair or bedside chair)?: Total Help needed to walk in hospital room?: Total Help needed climbing 3-5 steps with a railing? : Total 6 Click Score: 9    End of Session Equipment Utilized During Treatment: Gait belt Activity Tolerance: Patient tolerated treatment well Patient left: with call bell/phone within reach;with bed alarm set;in chair;with chair alarm set Nurse Communication: Mobility status;Need for lift equipment PT Visit Diagnosis: Other abnormalities of gait and mobility (R26.89);Muscle weakness (generalized) (M62.81);Difficulty in walking, not elsewhere classified (R26.2)     Time: 4332-9518 PT Time Calculation (min) (ACUTE ONLY): 22 min  Charges:  $Therapeutic Activity: 8-22 mins                     Caleb Popp, SPT [id number]   Axel Meas 01/04/2020, 12:20 PM

## 2020-01-04 NOTE — Consult Note (Signed)
Nephrology Consult   Requesting provider: Dr. Posey Pronto Service requesting consult: [facility] Reason for consult: AKI   Assessment/Recommendations: [name] is a/an 59 y.o. male with a past medical history significant for HTN, poorly controlled DM, chronic leukopenia, h/o CVA, h/o SUD, admitted for hypertensive urgency, hypokalemia, and mild rhabdomyolysis.  Non-Oliguric AKI, possible nephrotic syndrome: Patient presented with hypertensive urgency, peripheral edema, hematuria, proteinuria, and worsening kidney function. UA from 11/6 shows glucosuria, small hematuria, ketones 5, and significant proteinuria to >300. Furthermore, patient also has hypoalbuminemia at 1.5. Scr baseline from 2019-2021 appears to be 1-1.11, mildly increased at admission to 1.2, however increased to 1.3-1.4 since 11/7. Today, scr remains elevated at 1.32. CK elevated to 970 at admission, on 11/5, decreased to 568 on 11/7; would expect 5x elevation or CK >1500 for true rhabdomyolysis, so I do not think this is cause of kidney disease, but perhaps contributed to initial presentation. On exam, patient is volume overloaded with 2+ pitting edema to mid thigh, and non-pitting edema of b/l forearms. Unclear if patient has had all UOP counted, appears very low output at 0.97ml/kg/hr and this despite lasix 20mg  IV x2 yesterday. Perhaps that was too low a dose for response, can increase lasix dose to remove fluid. And although albumin does not have longterm benefit, can consider giving albumin with lasix to help oncotic support to remove excess fluid. Recommend renal US, repeat UA, microscopy for casts, urine p:c to assess for nephrotic syndrome. Patient has been taking PO for 4 days without improvement. Patient does have HFpEF, echo yesterday with preserved EF at 65-70%. Pulmonary exam is clear. Ddx is broad at this time, but suspect a nephrotic syndrome could be at play. -Chart reviewed: patient has benazepril which is an ACEi. Recommend  discontinuing this until AKI resolved.  -Continue to monitor daily Cr, Dose meds for GFR -Monitor Daily I/Os, Daily weight  -Maintain MAP>65 for optimal renal perfusion.  -Avoid nephrotoxic medications including NSAIDs and Vanc/Zosyn combo -Check Renal U/S  -Urine p:c, repeat UA for cast analysis -Currently no indication for HD  Hypokalemia:Patient also had hypokalemia to 2.5 on admission, repleted to 3.4 today (receiving 4 runs of KCl IV today). Recommend continued monitoring.  -Daily BMP  Hypertension: Significantly elevated to >200/100 at admission. Today appropriate at 130-150s SBP, most recently 150/96. Current medications include: atenolol 25 mg qd, benazepril 10 mg qd, hydralazine 50 mg q6h PRN. Please discontinue benazepril until AKI resolved.   Anemia: possibly iron deficiency. MCV WNL at 82, but b/l 11-12. hgb dropped during admission likely due to blood draws to most recently 9.7. Recommend iron labs, can be done outpatient. -Transfuse for Hgb<7 g/dL -No role for ESA in this setting -Daily CBC  HFpEF: Echo yesterday with preserved EF to 65-70%, normal LV fucntion, no regional wall abnormalities, moderate LVH. Hyperdense myocardium noted, consider hypertensive cardiomyopathy vs infiltrative cardiomyopathy noted. Given concern for nephrotic syndome (see above, ddx remains broad), will workup for nephrotic syndome, but infiltrative disease such as amyloidosis within that ddx. - per primary team  Recommendations conveyed to primary service.   [name], MD PGY-2 01/04/2020 9:58 AM   _____________________________________________________________________________________ CC: AKI  History of Present Illness: [name] is a/an 59 y.o. male with a past medical history of HTN, poorly controlled DM, chronic leukopenia, h/o CVA, h/o SUD, admitted for hypertensive urgency, hypokalemia, and mild rhabdomyolysis. Patient presented to the ED on 11/5 with significant hypertenstion  >200/100. He was also noted to have body aches, mildly elevated  CK 900s, now WNL 500s. BP has since resolved and more reasonable, SBP 130s-150s. Patient has CHF, recent echo shows preserved EF. However, he has significant volume overload with peripheral edema, no JVD, clear lungs. Likely not cardiac related for those reasons. In addition to the hypertensive urgency on admission, patient was also noted to have hematuria, proteinuria, and hypoalbuminemia. Despite lasix dose (20mg  IV x2 yesterday) he has minimal urine output, and worsening cr (1.3-1.4).   Medications:  Current Facility-Administered Medications  Medication Dose Route Frequency Provider Last Rate Last Admin  . 0.9 %  sodium chloride infusion  250 mL Intravenous PRN Opyd, Ilene Qua, MD      . acetaminophen (TYLENOL) tablet 650 mg  650 mg Oral Q6H PRN Opyd, Ilene Qua, MD       Or  . acetaminophen (TYLENOL) suppository 650 mg  650 mg Rectal Q6H PRN Opyd, Ilene Qua, MD      . atenolol (TENORMIN) tablet 25 mg  25 mg Oral Daily Opyd, Ilene Qua, MD   25 mg at 01/04/20 0835  . benazepril (LOTENSIN) tablet 10 mg  10 mg Oral Daily Para Skeans, MD   10 mg at 01/04/20 0836  . diltiazem (CARDIZEM CD) 24 hr capsule 180 mg  180 mg Oral Daily Opyd, Ilene Qua, MD   180 mg at 01/04/20 0831  . enoxaparin (LOVENOX) injection 65 mg  65 mg Subcutaneous Daily Leward Quan, RPH   65 mg at 01/03/20 1610  . gabapentin (NEURONTIN) capsule 800 mg  800 mg Oral BID Opyd, Ilene Qua, MD   800 mg at 01/04/20 0835  . hydrALAZINE (APRESOLINE) tablet 50 mg  50 mg Oral Q6H PRN Opyd, Ilene Qua, MD      . insulin aspart (novoLOG) injection 0-5 Units  0-5 Units Subcutaneous QHS Opyd, Ilene Qua, MD   2 Units at 01/01/20 2231  . insulin aspart (novoLOG) injection 0-9 Units  0-9 Units Subcutaneous TID WC Opyd, Ilene Qua, MD   1 Units at 01/03/20 1750  . insulin glargine (LANTUS) injection 25 Units  25 Units Subcutaneous BID Para Skeans, MD   25 Units at 01/04/20 [phone]  .  pantoprazole (PROTONIX) EC tablet 40 mg  40 mg Oral Daily Para Skeans, MD   40 mg at 01/04/20 0834  . polyethylene glycol (MIRALAX / GLYCOLAX) packet 17 g  17 g Oral Daily PRN Opyd, Ilene Qua, MD      . sodium chloride flush (NS) 0.9 % injection 3 mL  3 mL Intravenous Q12H Opyd, Ilene Qua, MD   3 mL at 01/03/20 1000  . sodium chloride flush (NS) 0.9 % injection 3 mL  3 mL Intravenous Q12H Opyd, Ilene Qua, MD   3 mL at 01/03/20 2317  . sodium chloride flush (NS) 0.9 % injection 3 mL  3 mL Intravenous PRN Opyd, Ilene Qua, MD         ALLERGIES Other  MEDICAL HISTORY Past Medical History:  Diagnosis Date  . Arthritis   . Bipolar affective ([place])   . CHF (congestive heart failure) ([place])   . Depression   . Diabetes mellitus    uncontrolled   . Hypertension      SOCIAL HISTORY Social History   Socioeconomic History  . Marital status: Single    Spouse name: Not on file  . Number of children: Not on file  . Years of education: Not on file  . Highest education level: Not on file  Occupational History  .  Not on file  Tobacco Use  . Smoking status: Former Research scientist (life sciences)  . Smokeless tobacco: Never Used  Vaping Use  . Vaping Use: Never used  Substance and Sexual Activity  . Alcohol use: No  . Drug use: Yes    Types: Cocaine  . Sexual activity: Yes    Birth control/protection: Condom  Other Topics Concern  . 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 Difficulty of Paying Living Expenses: Not on file  Food Insecurity:   . Worried About Charity fundraiser in the Last Year: Not on file  . Ran Out of Food in the Last Year: Not on file  Transportation Needs:   . Lack of  Transportation (Medical): Not on file  . Lack of Transportation (Non-Medical): Not on file  Physical Activity:   . Days of Exercise per Week: Not on file  . Minutes of Exercise per Session: Not on file  Stress:   . Feeling of Stress : Not on file  Social Connections:   . Frequency of Communication with Friends and Family: Not on file  . Frequency of Social Gatherings with Friends and Family: Not on file  . Attends Religious Services: Not on file  . Active Member of Clubs or Organizations: Not on file  . Attends Archivist Meetings: Not on file  . Marital Status: Not on file  Intimate Partner Violence:   . Fear of Current or Ex-Partner: Not on file  . Emotionally Abused: Not on file  . Physically Abused: Not on file  . Sexually Abused: Not on file     FAMILY HISTORY Family History  Problem Relation Age of Onset  . Diabetes Other   . Cancer Mother        uterus; deceased   . Heart attack Mother 41  . Hypertension Other   . Heart attack Father 73  . Stroke Sister        12/2012 age 79 y.o      Review of Systems: 12 systems reviewed Otherwise as per HPI, all other systems reviewed and negative  Physical Exam: Vitals:   01/04/20 0831 01/04/20 0835  BP: (!) 150/96   Pulse:  70  Resp:    Temp:    SpO2:     No intake/output data recorded.  Intake/Output Summary (Last 24 hours) at 01/04/2020 0958 Last data filed at 01/04/2020 4403 Gross per 24 hour  Intake 290 ml  Output 825 ml  Net -535 ml   General: well-appearing, no acute distress HEENT: anicteric sclera, oropharynx clear without lesions CV: regular rate, normal rhythm, no murmurs, no gallops, no rubs, significant peripheral edema 2+ pitting to mid thighs, non-pitting edema of b/l forearms Lungs: clear to auscultation bilaterally, normal work of breathing Abd: soft, non-tender, non-distended Skin: no visible lesions or rashes Psych: alert, engaged, appropriate mood and affect Musculoskeletal: no  obvious deformities Neuro: normal speech, no gross focal deficits   Test Results Reviewed Lab Results  Component Value Date   NA 134 (L) 01/04/2020   K 3.4 (L)  01/04/2020   CL 102 01/04/2020   CO2 26 01/04/2020   BUN 14 01/04/2020   CREATININE 1.32 (H) 01/04/2020   CALCIUM 8.2 (L) 01/04/2020   ALBUMIN 1.8 (L) 01/04/2020     I have reviewed all relevant outside healthcare records related to the patient's current hospitalization

## 2020-01-04 NOTE — Progress Notes (Signed)
PROGRESS NOTE    [name]  NIO:[id number] DOB: Jul 06, 1960 DOA: 12/31/2019  PCP: [name], NP    Brief Narrative:  HPI: [name] is a 59 y.o. male with medical history significant for hypertension, poorly controlled diabetes mellitus, chronic leukopenia, history of CVA, and history of substance abuse, now presenting to the emergency department for evaluation of aches.  Patient reports that he recently moved to the area from [place] to be closer to family, has not taken any of his medicines in a couple days at least, and has developed cramping pain mainly in his legs.  He denies any recent substance abuse, denies any alcohol use, denies fevers, chills, or chest pain, but presents for evaluation of worsening leg cramps and also hopes to get help with nursing home placement.  ED Course: Upon arrival to the ED, patient is found to be afebrile, saturating well on room air, and hypertensive to 220/120.  EKG features sinus rhythm with LVH.  Chest x-ray is notable for chronic cardiomegaly but no acute findings.  Chemistry panel features a potassium of 2.5 and glucose 252.  CBC with chronic mild leukopenia and slight normocytic anemia.  BNP is slightly elevated.  COVID-19 screening test is negative.  Serum CK is 970.  Patient was given 20 mEq of potassium in the ED.   Assessment & Plan:  Hypertension Assessment & Plan Blood pressure 126/90, pulse 65, temperature 97.6 F (36.4 C), temperature source Oral, resp. rate 18, height 5\' 8"  (1.727 m), weight 127.7 kg, SpO2 99 %. Lab is good. We will continue diltiazem, benazepril 40, atenolol 25 mg and cont hydralazine prn.   01/02/20 Blood pressure (!) 156/113, pulse 69, temperature (!) 97.4 F (36.3 C), temperature source Oral, resp. rate 16, height 5\' 8"  (1.727 m), weight 135.1 kg, SpO2 100 %. will add hydralazine 25 mg tid in addition to his other meds.   01/03/20 Blood pressure (!) 156/113, pulse 69, temperature (!) 97.4 F (36.3  C), temperature source Oral, resp. rate 16, height 5\' 8"  (1.727 m), weight 135.1 kg, SpO2 100 %. Held hydralazine to allow lasix and room for diuresis and decreased benazepril.       01/04/20       Continue atenolol and diltiazem.   Non-traumatic rhabdomyolysis Assessment & Plan Po hydration and HD.   Anemia Assessment & Plan We will follow and transfuse if needed. suspect Acd from renal disease. Current hb is 9.9 Stable.   Grade I diastolic dysfunction Assessment & Plan Echocardiogram pending.  Echo shows: 1. Left ventricular ejection fraction, by estimation, is 65 to 70%. The left ventricle has normal function. The left ventricle has no regional wall motion abnormalities. There is moderate left ventricular hypertrophy. Hyperdense myocardium, consider hypertensive cardiomyopathy versus infilrative cardiomyopathy. Left ventricular diastolic parameters are consistent with Grade I diastolic dysfunction (impaired relaxation). 2. Right ventricular systolic function is normal. The right ventricular size is normal. There is mildly elevated pulmonary artery systolic pressure. The estimated right ventricular systolic pressure is 81.8 mmHg. 3. Left atrial size was mildly dilated. 4. A small pericardial effusion is present. The pericardial effusion is circumferential. 5. The mitral valve is grossly normal. Mild mitral valve regurgitation. 6. The aortic valve is functionally bicuspid. There is moderate calcification of the aortic valve. Aortic valve regurgitation is trivial. Moderate aortic valve stenosis. Aortic valve mean gradient measures 13.0 mmHg. Aortic valve Vmax measures 2.37 m/s. Dimentionless index 0.32. 7. The inferior vena cava is dilated in size with <50%  respiratory variability, suggesting right atrial pressure of 15 mmHg.  Hypokalemia Assessment & Plan Pt given potassium we will check and replace . Potassium is still low at 2.7 he has been give 40 meq iv and repeat  level is pending.  Cont potassium supplementation. Level is much improved at 3.4 however with pt on diuretic therapy so will cont monitoring and replace.  Substance-related disorder [facility]) Assessment & Plan Counseling when he is awake but current uds negative.   Generalized body aches Assessment & Plan As reflected by elevated cpk suspect mild rhabdo, nontraumatic.  We will encourage po hydration.  Diarrhea: Assessment & Plan Received reports from nurse about pt having diarrhea and sample has not been collected. Pt in isolation for C.Diff. Resolved.   LE edema: Cont is lasix. Attribute to hypoalbuminemia due to nephrotic syndrome.   AKI; Attribute to ACEi and dehydration from diarrhea and rhabdo although mild. Avoid nephrotoxic meds contrast and renally dose meds.  DM II: accuchecks are stable and we will cont ssi and lantus.   DVT prophylaxis: lovenox Code Status: full Family Communication: none  Disposition Plan: TBD  Status is: Observation  The patient will require care spanning > 2 midnights and should be moved to inpatient because: Inpatient level of care appropriate due to severity of illness  Dispo: The patient is from: TBD              Anticipated d/c is to: TBD              Anticipated d/c date is: 3 days              Patient currently is not medically stable to d/c.  Consultants:  nephrology  Procedures:  HD ECHO   Subjective: Pt is drowsy and wake up and answers questions appropriately.   Pt is alert today and denies any complaints says he did not have diarrhea. Labs still show hypokalemia and hemoglobin is 9.3.  Pt is alert, his edema that was 1 + yesterday is 2+ today and he notices it as well. States he feels off balance while sitting and trying to sit up in bed.  D/w him that his potassium is low and we have to give him lasix for swelling and also rule out blood clot. Echo reports as about pt can get cardiac mri as outpatient as he is  asymptomatic.   Pt is alert and today d/w him about nephrology consult to evlauate for underlying CKD. Pt agrees with consult. States he needs to go to facility not at home when talked about d/c planning.   Objective: Vitals:   01/04/20 0500 01/04/20 0831 01/04/20 0835 01/04/20 1412  BP: 136/82 (!) 150/96  (!) 156/113  Pulse: 67  70 69  Resp: 18   16  Temp: 97.7 F (36.5 C)   (!) 97.4 F (36.3 C)  TempSrc: Oral   Oral  SpO2: 100%   100%  Weight: 135.1 kg     Height:       SpO2: 100 %  Intake/Output Summary (Last 24 hours) at 01/04/2020 1723 Last data filed at 01/04/2020 5176 Gross per 24 hour  Intake 280 ml  Output 725 ml  Net -445 ml   Filed Weights   01/02/20 0605 01/03/20 0338 01/04/20 0500  Weight: 125.8 kg 125.8 kg 135.1 kg    Examination: Blood pressure (!) 156/113, pulse 69, temperature (!) 97.4 F (36.3 C), temperature source Oral, resp. rate 16, height 5\' 8"  (1.727 m), weight 135.1  kg, SpO2 100 %. General exam: Appears calm and comfortable  HEENT:EOMI, perrl  Respiratory system: Clear to auscultation. Respiratory effort normal. Cardiovascular system: S1 & S2 heard, RRR. No JVD, murmurs, rubs, gallops or clicks. 2 +pedal edema. Gastrointestinal system: Abdomen is nondistended, soft and nontender. No organomegaly or masses felt. Normal bowel sounds heard. Central nervous system: Alert and oriented.CN grossly intact No focal neurological deficits. Extremities: pt moving all 4 ext and ambulating. Skin: No rashes, lesions or ulcers Psychiatry: Judgement and insight appear normal. Mood & affect appropriate.   Data Reviewed: I have personally reviewed following labs and imaging studies  I/O last 3 completed shifts: In: 290 [P.O.:280; I.V.:10] Out: 825 [Urine:825] No intake/output data recorded. Lab Results  Component Value Date   CREATININE 1.32 (H) 01/04/2020   CREATININE 1.29 (H) 01/03/2020   CREATININE 1.30 (H) 01/03/2020   CBC: Recent Labs  Lab  12/31/19 2122 01/01/20 0253 01/02/20 0750 01/03/20 0357 01/04/20 0346  WBC 3.5* 3.7* 3.5* 3.5* 3.4*  NEUTROABS 1.8 1.8 1.3* 1.5* 1.5*  HGB 11.0* 9.9* 9.3* 9.5* 9.7*  HCT 34.6* 31.7* 30.2* 30.7* 31.4*  MCV 81.4 80.9 81.4 81.0 81.8  PLT 266 254 222 246 767   Basic Metabolic Panel: Recent Labs  Lab 01/01/20 0045 01/01/20 0253 01/02/20 0750 01/02/20 1618 01/03/20 0357 01/03/20 2108 01/04/20 0346  NA  --    < > 138 133* 134* 135 134*  K  --    < > 2.7* 3.0* 2.9* 3.6 3.4*  CL  --    < > 105 102 103 101 102  CO2  --    < > 24 23 23 25 26   GLUCOSE  --    < > 99 146* 186* 135* 105*  BUN  --    < > 13 13 14 14 14   CREATININE  --    < > 1.38* 1.42* 1.30* 1.29* 1.32*  CALCIUM  --    < > 8.0* 8.1* 7.9* 8.2* 8.2*  MG 1.9  --   --   --  2.1  --  1.9   < > = values in this interval not displayed.   GFR: Estimated Creatinine Clearance: 82.1 mL/min (A) (by C-G formula based on SCr of 1.32 mg/dL (H)). Liver Function Tests: Recent Labs  Lab 12/31/19 2122 01/03/20 0357 01/04/20 0346  AST 28 22 21   ALT 18 14 14   ALKPHOS 90 73 72  BILITOT 0.6 0.4 0.3  PROT 5.8* 5.0* 5.4*  ALBUMIN 2.2* 1.8* 1.8*   No results for input(s): LIPASE, AMYLASE in the last 168 hours. No results for input(s): AMMONIA in the last 168 hours.  Coagulation Profile: No results for input(s): INR, PROTIME in the last 168 hours. Cardiac Enzymes: Recent Labs  Lab 12/31/19 2122 01/02/20 0750  CKTOTAL 970* 568*   BNP (last 3 results) No results for input(s): PROBNP in the last 8760 hours. HbA1C: No results for input(s): HGBA1C in the last 72 hours. CBG: Recent Labs  Lab 01/03/20 1653 01/03/20 2037 01/04/20 0750 01/04/20 1119 01/04/20 1603  GLUCAP 121* 127* 84 94 69*   Lipid Profile: No results for input(s): CHOL, HDL, LDLCALC, TRIG, CHOLHDL, LDLDIRECT in the last 72 hours. Thyroid Function Tests: No results for input(s): TSH, T4TOTAL, FREET4, T3FREE, THYROIDAB in the last 72 hours. Anemia  Panel: Recent Labs    01/02/20 1618  VITAMINB12 278  FOLATE 8.7  FERRITIN 100  TIBC 245*  IRON 29*  RETICCTPCT 1.8   Sepsis Labs: Recent  Labs  Lab 01/02/20 1618  LATICACIDVEN 1.2    Recent Results (from the past 240 hour(s))  Respiratory Panel by RT PCR (Flu A&B, Covid) - Nasopharyngeal Swab     Status: None   Collection Time: 12/31/19  2:00 PM   Specimen: Nasopharyngeal Swab  Result Value Ref Range Status   SARS Coronavirus 2 by RT PCR NEGATIVE NEGATIVE Final    Comment: (NOTE) SARS-CoV-2 target nucleic acids are NOT DETECTED.  The SARS-CoV-2 RNA is generally detectable in upper respiratoy specimens during the acute phase of infection. The lowest concentration of SARS-CoV-2 viral copies this assay can detect is 131 copies/mL. A negative result does not preclude SARS-Cov-2 infection and should not be used as the sole basis for treatment or other patient management decisions. A negative result may occur with  improper specimen collection/handling, submission of specimen other than nasopharyngeal swab, presence of viral mutation(s) within the areas targeted by this assay, and inadequate number of viral copies (<131 copies/mL). A negative result must be combined with clinical observations, patient history, and epidemiological information. The expected result is Negative.  Fact Sheet for Patients:  PinkCheek.be  Fact Sheet for Healthcare Providers:  GravelBags.it  This test is no t yet approved or cleared by the [place] FDA and  has been authorized for detection and/or diagnosis of SARS-CoV-2 by FDA under an Emergency Use Authorization (EUA). This EUA will remain  in effect (meaning this test can be used) for the duration of the COVID-19 declaration under Section 564(b)(1) of the Act, 21 U.S.C. section 360bbb-3(b)(1), unless the authorization is terminated or revoked sooner.     Influenza A by PCR  NEGATIVE NEGATIVE Final   Influenza B by PCR NEGATIVE NEGATIVE Final    Comment: (NOTE) The Xpert Xpress SARS-CoV-2/FLU/RSV assay is intended as an aid in  the diagnosis of influenza from Nasopharyngeal swab specimens and  should not be used as a sole basis for treatment. Nasal washings and  aspirates are unacceptable for Xpert Xpress SARS-CoV-2/FLU/RSV  testing.  Fact Sheet for Patients: PinkCheek.be  Fact Sheet for Healthcare Providers: GravelBags.it  This test is not yet approved or cleared by the [place] FDA and  has been authorized for detection and/or diagnosis of SARS-CoV-2 by  FDA under an Emergency Use Authorization (EUA). This EUA will remain  in effect (meaning this test can be used) for the duration of the  Covid-19 declaration under Section 564(b)(1) of the Act, 21  U.S.C. section 360bbb-3(b)(1), unless the authorization is  terminated or revoked. Performed at [facility], [place] [address] [place], [place] [address]   SARS Coronavirus 2 by RT PCR (hospital order, performed in [facility] hospital lab) Nasopharyngeal Nasopharyngeal Swab     Status: None   Collection Time: 01/03/20  5:09 PM   Specimen: Nasopharyngeal Swab  Result Value Ref Range Status   SARS Coronavirus 2 NEGATIVE NEGATIVE Final    Comment: (NOTE) SARS-CoV-2 target nucleic acids are NOT DETECTED.  The SARS-CoV-2 RNA is generally detectable in upper and lower respiratory specimens during the acute phase of infection. The lowest concentration of SARS-CoV-2 viral copies this assay can detect is 250 copies / mL. A negative result does not preclude SARS-CoV-2 infection and should not be used as the sole basis for treatment or other patient management decisions.  A negative result may occur with improper specimen collection / handling, submission of specimen other than nasopharyngeal swab, presence of viral mutation(s) within the areas  targeted by this assay,  and inadequate number of viral copies (<250 copies / mL). A negative result must be combined with clinical observations, patient history, and epidemiological information.  Fact Sheet for Patients:   StrictlyIdeas.no  Fact Sheet for Healthcare Providers: BankingDealers.co.za  This test is not yet approved or  cleared by the [place] FDA and has been authorized for detection and/or diagnosis of SARS-CoV-2 by FDA under an Emergency Use Authorization (EUA).  This EUA will remain in effect (meaning this test can be used) for the duration of the COVID-19 declaration under Section 564(b)(1) of the Act, 21 U.S.C. section 360bbb-3(b)(1), unless the authorization is terminated or revoked sooner.  Performed at [facility], [place] [address] [place], [place] [address]       Radiology Studies: DG Chest 2 View  Result Date: 12/31/2019 CLINICAL DATA:  Shortness of breath.  Generalized body aches. EXAM: CHEST - 2 VIEW COMPARISON:  Radiograph 12/25/2019.  CT 12/12/2019 FINDINGS: Improved lung volumes from prior exam. Chronic cardiomegaly. No pulmonary edema. No focal airspace disease. Trace fluid in the fissures without significant sub pulmonic effusion. No confluent consolidation. No pneumothorax. No acute osseous abnormalities are seen. IMPRESSION: Chronic cardiomegaly. No acute chest findings. Electronically Signed   By: Keith Rake M.D.   On: 12/31/2019 20:43   ECHOCARDIOGRAM COMPLETE  Result Date: 01/01/2020    ECHOCARDIOGRAM REPORT   Patient Name:   MIKEL HARDGROVE Date of Exam: 01/01/2020 Medical Rec #:  [id number]     Height:       68.0 in Accession #:    [id number]    Weight:       281.5 lb Date of Birth:  1960/04/27      BSA:          2.363 m Patient Age:    47 years      BP:           151/94 mmHg Patient Gender: M             HR:           71 bpm. Exam Location:  Inpatient Procedure: 2D Echo Indications:     hypertensive urgency  History:        Patient has prior history of Echocardiogram examinations, most                 recent 09/29/2017. Stroke; Risk Factors:Hypertension and Diabetes.  Sonographer:    Johny Chess Referring Phys: [facility]   1. Left ventricular ejection fraction, by estimation, is 65 to 70%.  The left ventricle has normal function.  The left ventricle has no regional wall motion abnormalities.  There is moderate left ventricular hypertrophy.  Hyperdense myocardium, consider hypertensive cardiomyopathy versus infilrative cardiomyopathy.  Left ventricular diastolic parameters are consistent with Grade I diastolic dysfunction (impaired relaxation).   2. Right ventricular systolic function is normal.  The right ventricular size is normal. There is mildly elevated pulmonary artery systolic pressure. The estimated right ventricular systolic pressure is 63.3 mmHg.   3. Left atrial size was mildly dilated.   4. A small pericardial effusion is present. The pericardial effusion is circumferential.   5. The mitral valve is grossly normal. Mild mitral valve regurgitation.   6. The aortic valve is functionally bicuspid. There is moderate calcification of the aortic valve. Aortic valve regurgitation is trivial. Moderate aortic valve stenosis. Aortic valve mean gradient measures 13.0 mmHg. Aortic valve Vmax measures 2.37 m/s.  Dimentionless index 0.32.  7. The inferior vena cava is dilated in size with <50% respiratory variability, suggesting right atrial pressure of 15 mmHg.   FINDINGS  Left Ventricle: Left ventricular ejection fraction, by estimation, is 65 to 70%. The left ventricle has normal function. The left ventricle has no regional wall motion abnormalities. The left ventricular internal cavity size was normal in size. There is  moderate left ventricular hypertrophy. Left ventricular diastolic parameters are consistent with Grade I diastolic dysfunction (impaired  relaxation). Right Ventricle: The right ventricular size is normal. No increase in right ventricular wall thickness. Right ventricular systolic function is normal. There is mildly elevated pulmonary artery systolic pressure. The tricuspid regurgitant velocity is 2.66  m/s, and with an assumed right atrial pressure of 15 mmHg, the estimated right ventricular systolic pressure is 68.3 mmHg. Left Atrium: Left atrial size was mildly dilated. Right Atrium: Right atrial size was normal in size. Pericardium: A small pericardial effusion is present. The pericardial effusion is circumferential. Mitral Valve: The mitral valve is grossly normal. Mild mitral valve regurgitation. Tricuspid Valve: The tricuspid valve is grossly normal. Tricuspid valve regurgitation is mild. Aortic Valve: The aortic valve is bicuspid. There is moderate calcification of the aortic valve. There is mild to moderate aortic valve annular calcification. Aortic valve regurgitation is trivial. Moderate aortic stenosis is present. Aortic valve mean gradient measures 13.0 mmHg. Aortic valve peak gradient measures 22.5 mmHg. Aortic valve area, by VTI measures 1.21 cm. Pulmonic Valve: The pulmonic valve was grossly normal. Pulmonic valve regurgitation is trivial. Aorta: The aortic root is normal in size and structure. Venous: The inferior vena cava is dilated in size with less than 50% respiratory variability, suggesting right atrial pressure of 15 mmHg. IAS/Shunts: No atrial level shunt detected by color flow Doppler.  LEFT VENTRICLE PLAX 2D LVIDd:         4.80 cm  Diastology LVIDs:         3.20 cm  LV e' medial:    5.44 cm/s LV PW:         1.30 cm  LV E/e' medial:  12.9 LV IVS:        1.30 cm  LV e' lateral:   6.31 cm/s LVOT diam:     2.20 cm  LV E/e' lateral: 11.1 LV SV:         58 LV SV Index:   25 LVOT Area:     3.80 cm  RIGHT VENTRICLE             IVC RV S prime:     14.90 cm/s  IVC diam: 2.70 cm TAPSE (M-mode): 2.7 cm LEFT ATRIUM             Index        RIGHT ATRIUM           Index LA diam:        4.40 cm 1.86 cm/m  RA Area:     16.10 cm LA Vol (A2C):   57.0 ml 24.12 ml/m RA Volume:   41.60 ml  17.60 ml/m LA Vol (A4C):   87.9 ml 37.19 ml/m LA Biplane Vol: 73.9 ml 31.27 ml/m  AORTIC VALVE AV Area (Vmax):    1.30 cm AV Area (Vmean):   1.19 cm AV Area (VTI):     1.21 cm AV Vmax:           237.00 cm/s AV Vmean:          165.000 cm/s AV VTI:  0.482 m AV Peak Grad:      22.5 mmHg AV Mean Grad:      13.0 mmHg LVOT Vmax:         80.90 cm/s LVOT Vmean:        51.800 cm/s LVOT VTI:          0.153 m LVOT/AV VTI ratio: 0.32  AORTA Ao Root diam: 3.10 cm Ao Asc diam:  3.70 cm MITRAL VALVE               TRICUSPID VALVE MV Area (PHT): 3.77 cm    TR Peak grad:   28.3 mmHg MV Decel Time: 201 msec    TR Vmax:        266.00 cm/s MV E velocity: 70.30 cm/s MV A velocity: 85.30 cm/s  SHUNTS MV E/A ratio:  0.82        Systemic VTI:  0.15 m                            Systemic Diam: 2.20 cm Rozann Lesches MD Electronically signed by Rozann Lesches MD Signature Date/Time: 01/01/2020/1:40:54 PM    Final     Current Facility-Administered Medications (Endocrine & Metabolic):  .  insulin aspart (novoLOG) injection 0-5 Units .  insulin aspart (novoLOG) injection 0-9 Units .  insulin glargine (LANTUS) injection 40 Units   Current Facility-Administered Medications (Cardiovascular):  .  atenolol (TENORMIN) tablet 25 mg .  benazepril (LOTENSIN) tablet 40 mg .  diltiazem (CARDIZEM CD) 24 hr capsule 180 mg .  hydrALAZINE (APRESOLINE) tablet 50 mg     Current Facility-Administered Medications (Analgesics):  .  acetaminophen (TYLENOL) tablet 650 mg **OR** acetaminophen (TYLENOL) suppository 650 mg    Current Facility-Administered Medications (Hematological):  Marland Kitchen  [START ON 01/02/2020] enoxaparin (LOVENOX) injection 65 mg   Current Facility-Administered Medications (Other):  .  0.9 %  sodium chloride infusion .  gabapentin (NEURONTIN) capsule 800 mg .   magnesium oxide (MAG-OX) tablet 400 mg .  pantoprazole (PROTONIX) EC tablet 40 mg .  polyethylene glycol (MIRALAX / GLYCOLAX) packet 17 g .  sodium chloride flush (NS) 0.9 % injection 3 mL .  sodium chloride flush (NS) 0.9 % injection 3 mL .  sodium chloride flush (NS) 0.9 % injection 3 mL  No current outpatient medications on file.  Anti-infectives (From admission, onward)   None      Scheduled Meds: . atenolol  25 mg Oral Daily  . diltiazem  180 mg Oral Daily  . enoxaparin (LOVENOX) injection  65 mg Subcutaneous Daily  . furosemide  80 mg Intravenous Q12H  . gabapentin  800 mg Oral BID  . insulin aspart  0-5 Units Subcutaneous QHS  . insulin aspart  0-9 Units Subcutaneous TID WC  . insulin glargine  25 Units Subcutaneous BID  . pantoprazole  40 mg Oral Daily  . potassium chloride  30 mEq Oral BID  . potassium chloride  20 mEq Oral Daily  . sodium chloride flush  3 mL Intravenous Q12H  . sodium chloride flush  3 mL Intravenous Q12H   Continuous Infusions: . sodium chloride    . albumin human    . magnesium sulfate bolus IVPB       LOS: 0 days    Para Skeans, MD [facility] Pager [phone] If 7PM-7AM, please contact night-coverage [email or web address] Password [facility] 01/04/2020, 5:23 PM

## 2020-01-05 ENCOUNTER — Inpatient Hospital Stay (HOSPITAL_COMMUNITY): Payer: Medicare HMO

## 2020-01-05 DIAGNOSIS — M6282 Rhabdomyolysis: Secondary | ICD-10-CM

## 2020-01-05 DIAGNOSIS — I10 Essential (primary) hypertension: Secondary | ICD-10-CM | POA: Diagnosis not present

## 2020-01-05 DIAGNOSIS — I5189 Other ill-defined heart diseases: Secondary | ICD-10-CM | POA: Diagnosis not present

## 2020-01-05 DIAGNOSIS — R140 Abdominal distension (gaseous): Secondary | ICD-10-CM

## 2020-01-05 DIAGNOSIS — R52 Pain, unspecified: Secondary | ICD-10-CM | POA: Diagnosis not present

## 2020-01-05 LAB — COMPREHENSIVE METABOLIC PANEL
ALT: 13 U/L (ref 0–44)
AST: 19 U/L (ref 15–41)
Albumin: 2.1 g/dL — ABNORMAL LOW (ref 3.5–5.0)
Alkaline Phosphatase: 74 U/L (ref 38–126)
Anion gap: 6 (ref 5–15)
BUN: 16 mg/dL (ref 6–20)
CO2: 25 mmol/L (ref 22–32)
Calcium: 8.3 mg/dL — ABNORMAL LOW (ref 8.9–10.3)
Chloride: 104 mmol/L (ref 98–111)
Creatinine, Ser: 1.27 mg/dL — ABNORMAL HIGH (ref 0.61–1.24)
GFR, Estimated: >60 mL/min (ref >60)
Glucose, Bld: 125 mg/dL — ABNORMAL HIGH (ref 70–99)
Potassium: 3.5 mmol/L (ref 3.5–5.1)
Sodium: 135 mmol/L (ref 135–145)
Total Bilirubin: 0.5 mg/dL (ref 0.3–1.2)
Total Protein: 5.5 g/dL — ABNORMAL LOW (ref 6.5–8.1)

## 2020-01-05 LAB — KAPPA/LAMBDA LIGHT CHAINS
Kappa free light chain: 96.4 mg/L — ABNORMAL HIGH (ref 3.3–19.4)
Kappa, lambda light chain ratio: 2.11 — ABNORMAL HIGH (ref 0.26–1.65)
Lambda free light chains: 45.7 mg/L — ABNORMAL HIGH (ref 5.7–26.3)

## 2020-01-05 LAB — GLUCOSE, CAPILLARY
Glucose-Capillary: 94 mg/dL (ref 70–99)
Glucose-Capillary: 107 mg/dL — ABNORMAL HIGH (ref 70–99)
Glucose-Capillary: 160 mg/dL — ABNORMAL HIGH (ref 70–99)
Glucose-Capillary: 122 mg/dL — ABNORMAL HIGH (ref 70–99)

## 2020-01-05 LAB — ANA W/REFLEX IF POSITIVE: Anti Nuclear Antibody (ANA): NEGATIVE

## 2020-01-05 MED ORDER — LORAZEPAM 2 MG/ML IJ SOLN: 0.5000 mg | Freq: Once | INTRAMUSCULAR | Status: DC | PRN

## 2020-01-05 MED ORDER — IOHEXOL 300 MG/ML  SOLN
100.0000 mL | Freq: Once | INTRAMUSCULAR | Status: AC | PRN
  Administered 2020-01-05: 100 mL via INTRAVENOUS

## 2020-01-05 NOTE — Progress Notes (Signed)
Nephrology Follow-Up Consult note   Assessment/Recommendations: [name] is a/an 59 y.o. male with a past medical history significant for TN, poorly controlled DM, chronic leukopenia, h/o CVA, h/o SUD, admitted for hypertensive urgency, hypokalemia, and mild rhabdomyolysis.    Nephrotic Syndrome likely due to diabetic nephropathy: Cr still elevated at 1.27, mild improvement from 1.32 yesterday. Patient appropriately responded to lasix with UOP 1.85L or 0.74mL/kg/hr, this more than doubled UOP from 11/9. Weight has increased, but suspect this was due to worsening edema in the last few days, expect this to improve with continued diuresis. Awaiting nephrotic labs including light chains and ANA. Hep B surface ag and Hep C ab negative. Potassium appropriate today at 3.5, recommend continuing supplementation in setting of lasix. -Continue lasix 80mg  IV BID -Continue KCl 20 mEq qd -Monitor Daily I/Os, Daily weight   AKI: Most likely due to hypertensive emergency and hemodynamic changes in setting of ACEi. 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 -Continue to monitor daily Cr, Dose meds for GFR -Maintain MAP>65 for optimal renal perfusion.  -Avoid nephrotoxic medications including NSAIDs, ACEi/ARBS and Vanc/Zosyn combo  Hypertension: mild SBP ranging from 132-165. Continue atenolol, dilt,  hydral prn, also expect to improve with diuresis. Continue to monitor.   LE edema: continue diuresis, see above.  Hypokalemia: appropriately repleted, see management above. Continue monitoring electrolytes with diuresis, including Mg.  -KCl mEq qd  Anemia: possibly iron deficiency. CBC not obtained today. MCV WNL at 82, but b/l 11-12. hgb dropped during admission likely due to blood draws to most recently 9.7. Recommend iron labs, can be done outpatient. -Transfuse for Hgb<7 g/dL -No role for ESA in this setting -Daily CBC  Uncontrolled Diabetes Mellitus Type 2 with Hyperglycemia BG well controlled in  hospital from 125-186 yesterday, most recent 94, some hypoglycemia to 69. Can consider decreasing glargine to 45U to prevent hypoglycemia. Suspect patient is easily controlled in hospital with diet and medication compliance. Recommend SGLT2i farxiga as outpatient, as this provides benefits of cardiac and renal protection in addition to A1c lowering. - per primary team  Recommendations conveyed to primary service.   [name], MD PGY-2 01/05/2020 9:38 AM  ___________________________________________________________  CC: AKI  Interval History/Subjective: patient reports that he is "struggling" today. Due to the edema, he has difficulty shifting and moving and is more uncomfortable.   Medications:  Current Facility-Administered Medications  Medication Dose Route Frequency Provider Last Rate Last Admin  . 0.9 %  sodium chloride infusion  250 mL Intravenous PRN Opyd, Ilene Qua, MD      . acetaminophen (TYLENOL) tablet 650 mg  650 mg Oral Q6H PRN Opyd, Ilene Qua, MD   650 mg at 01/04/20 2202   Or  . acetaminophen (TYLENOL) suppository 650 mg  650 mg Rectal Q6H PRN Opyd, Ilene Qua, MD      . albumin human 25 % solution 25 g  25 g Intravenous Q6H Para Skeans, MD 60 mL/hr at 01/05/20 0137 25 g at 01/05/20 0137  . atenolol (TENORMIN) tablet 25 mg  25 mg Oral Daily Opyd, Ilene Qua, MD   25 mg at 01/04/20 0835  . diltiazem (CARDIZEM CD) 24 hr capsule 180 mg  180 mg Oral Daily Opyd, Ilene Qua, MD   180 mg at 01/04/20 0831  . enoxaparin (LOVENOX) injection 65 mg  65 mg Subcutaneous Daily Leward Quan, RPH   65 mg at 01/04/20 1103  . furosemide (LASIX) injection 80 mg  80 mg Intravenous  Q12H Rosita Fire, MD   80 mg at 01/05/20 3546  . gabapentin (NEURONTIN) capsule 800 mg  800 mg Oral BID [name], MD   800 mg at 01/04/20 2203  . hydrALAZINE (APRESOLINE) tablet 50 mg  50 mg Oral Q6H PRN Opyd, Ilene Qua, MD      . insulin aspart (novoLOG) injection 0-5 Units  0-5 Units  Subcutaneous QHS Opyd, Ilene Qua, MD   2 Units at 01/01/20 2231  . insulin aspart (novoLOG) injection 0-9 Units  0-9 Units Subcutaneous TID WC Opyd, Ilene Qua, MD   1 Units at 01/03/20 1750  . insulin glargine (LANTUS) injection 25 Units  25 Units Subcutaneous BID Para Skeans, MD   25 Units at 01/04/20 2202  . pantoprazole (PROTONIX) EC tablet 40 mg  40 mg Oral Daily Para Skeans, MD   40 mg at 01/04/20 0834  . polyethylene glycol (MIRALAX / GLYCOLAX) packet 17 g  17 g Oral Daily PRN Opyd, Ilene Qua, MD      . potassium chloride SA (KLOR-CON) CR tablet 20 mEq  20 mEq Oral Daily Rosita Fire, MD      . sodium chloride flush (NS) 0.9 % injection 3 mL  3 mL Intravenous Q12H Opyd, Ilene Qua, MD   3 mL at 01/03/20 1000  . sodium chloride flush (NS) 0.9 % injection 3 mL  3 mL Intravenous Q12H Opyd, Ilene Qua, MD   3 mL at 01/03/20 2317  . sodium chloride flush (NS) 0.9 % injection 3 mL  3 mL Intravenous PRN Opyd, Ilene Qua, MD          Review of Systems: 10 systems reviewed and negative except per interval history/subjective  Physical Exam: Vitals:   01/04/20 2003 01/05/20 0511  BP: (!) 165/98 132/83  Pulse: 78 71  Resp: 18 19  Temp: 98.4 F (36.9 C) 97.9 F (36.6 C)  SpO2: 100% 97%   No intake/output data recorded.  Intake/Output Summary (Last 24 hours) at 01/05/2020 5681 Last data filed at 01/05/2020 0440 Gross per 24 hour  Intake 360 ml  Output 1850 ml  Net -1490 ml   Constitutional: appears older than age, resting comfortably in bed, no acute distress ENMT: ears and nose without scars or lesions, MMM CV: normal rate, no JVD, significant peripheral edema 2+ to mid thigh, non-pitting in bilateral forearms Respiratory: clear to auscultation, normal work of breathing Gastrointestinal: soft, non-tender, no palpable masses or hernias Skin: no visible lesions or rashes Psych: alert, appropriate mood and affect   Test Results I personally reviewed new and old clinical  labs and radiology tests Lab Results  Component Value Date   NA 135 01/05/2020   K 3.5 01/05/2020   CL 104 01/05/2020   CO2 25 01/05/2020   BUN 16 01/05/2020   CREATININE 1.27 (H) 01/05/2020   CALCIUM 8.3 (L) 01/05/2020   ALBUMIN 2.1 (L) 01/05/2020

## 2020-01-05 NOTE — Progress Notes (Addendum)
Pt's due for STAT CT abd and pelvis, pt came back to his room without doing it, stated he's too claustrophobic. RN paged [facility], received order for Ativan 0.5mg , pt agrees to give it a try.

## 2020-01-05 NOTE — Progress Notes (Signed)
PROGRESS NOTE    [name]  WUJ:[id number] DOB: 1960-11-30 DOA: 12/31/2019 PCP: [name], NP   Brief Narrative: [name] is a 59 y.o. malewith medical history significant forhypertension, poorly controlled diabetes mellitus, chronic leukopenia, history of CVA, and history of substance abuse. Patient presented secondary to body aches with evidence of mild rhabdomyolysis and volume overload, found to have nephrotic syndrome and AKI. Started on IV diuresis.   Assessment & Plan:   Principal Problem:   Generalized body aches Active Problems:   Hypertension   Substance-related disorder (HCC)   Hypokalemia   Non-traumatic rhabdomyolysis   Grade I diastolic dysfunction   Anemia   Hypokalemia due to loss of potassium   Nephrotic syndrome Concern this is secondary to patient's underlying diabetes mellitus. Nephrology consulted and managing. Urinalysis significant for large protein/glucose with a urine protein/creatinine of 3.51. Serum lbumin of around 2. -Nephrology recommendations: Lasix IV, albumin IV  Abdominal distension Significant. Associated abdominal pain. Patient states he is not passing gas but nurse reports he did have a large watery stool. -Abdominal x-ray  AKI on CKD stage III AKI secondary to hypertensive emergency/ACE inhibitor use. Baseline creatinine of about 1.1. Peak creatinine of 1.42. benazepril held. Back towards baseline.  Chronic diastolic heart dysfunction Stable.  Primary hypertension Hypertensive emergency on admission. Blood pressure better controlled now but still elevated at times. -Continue atenolol and diltiazem  Anemia Normocytic. Anemia panel suggests possible mixed picture. Stable. No evidence of bleed. -FOBT pending  Hypokalemia -Continue potassium supplementation -BMP daily  Diabetes mellitus, type 2 Uncontrolled with hyperglycemia. Patient is on Lantus 40 units qhs and Januemet as an outpatient. -Continue Lantus  25 units BID and SSI  Bicuspid aortic valve Moderate aortic valve stenosis Associated LVH without evidence of LV systolic dysfunction. No LV dilation. -Cardiology follow-up outpatient   DVT prophylaxis: Lovenox Code Status:   Code Status: Full Code Family Communication: None at bedside Disposition Plan: Discharge to SNF pending nephrology recommendations. Likely several days   Consultants:   Nephrology  Procedures:   TRANSTHORACIC ECHOCARDIOGRAM (01/01/2020) IMPRESSIONS    1. Left ventricular ejection fraction, by estimation, is 65 to 70%. The  left ventricle has normal function. The left ventricle has no regional  wall motion abnormalities. There is moderate left ventricular hypertrophy.  Hyperdense myocardium, consider  hypertensive cardiomyopathy versus infilrative cardiomyopathy. Left  ventricular diastolic parameters are consistent with Grade I diastolic  dysfunction (impaired relaxation).  2. Right ventricular systolic function is normal. The right ventricular  size is normal. There is mildly elevated pulmonary artery systolic  pressure. The estimated right ventricular systolic pressure is 95.6 mmHg.  3. Left atrial size was mildly dilated.  4. A small pericardial effusion is present. The pericardial effusion is  circumferential.  5. The mitral valve is grossly normal. Mild mitral valve regurgitation.  6. The aortic valve is functionally bicuspid. There is moderate  calcification of the aortic valve. Aortic valve regurgitation is trivial.  Moderate aortic valve stenosis. Aortic valve mean gradient measures 13.0  mmHg. Aortic valve Vmax measures 2.37 m/s.  Dimentionless index 0.32.  7. The inferior vena cava is dilated in size with <50% respiratory  variability, suggesting right atrial pressure of 15 mmHg.   Antimicrobials:  None    Subjective: Abdominal pain. States he has not passed gas  Objective: Vitals:   01/04/20 0835 01/04/20 1412 01/04/20  2003 01/05/20 0511  BP:  (!) 156/113 (!) 165/98 132/83  Pulse: 70 69 78 71  Resp:  16 18 19   Temp:  (!) 97.4 F (36.3 C) 98.4 F (36.9 C) 97.9 F (36.6 C)  TempSrc:  Oral Oral Axillary  SpO2:  100% 100% 97%  Weight:    (!) 136.8 kg  Height:        Intake/Output Summary (Last 24 hours) at 01/05/2020 0905 Last data filed at 01/05/2020 0440 Gross per 24 hour  Intake 360 ml  Output 1850 ml  Net -1490 ml   Filed Weights   01/03/20 0338 01/04/20 0500 01/05/20 0511  Weight: 125.8 kg 135.1 kg (!) 136.8 kg    Examination:  General exam: Appears calm and comfortable Respiratory system: Clear to auscultation. Respiratory effort normal. Cardiovascular system: S1 & S2 heard, RRR. 2/6 systolic murmur Gastrointestinal system: Abdomen is nondistended, soft and nontender. No organomegaly or masses felt. Normal bowel sounds heard. Central nervous system: Somnolent but arouses. Oriented to person and city. No focal neurological deficits. Musculoskeletal: 2+ BLE edema. No calf tenderness Skin: No cyanosis. No rashes Psychiatry: Judgement and insight appear normal.     Data Reviewed: I have personally reviewed following labs and imaging studies  CBC Lab Results  Component Value Date   WBC 3.4 (L) 01/04/2020   RBC 3.84 (L) 01/04/2020   HGB 9.7 (L) 01/04/2020   HCT 31.4 (L) 01/04/2020   MCV 81.8 01/04/2020   MCH 25.3 (L) 01/04/2020   PLT 257 01/04/2020   MCHC 30.9 01/04/2020   RDW 15.0 01/04/2020   LYMPHSABS 1.2 01/04/2020   MONOABS 0.5 01/04/2020   EOSABS 0.2 01/04/2020   BASOSABS 0.0 26/94/8546     Last metabolic panel Lab Results  Component Value Date   NA 135 01/05/2020   K 3.5 01/05/2020   CL 104 01/05/2020   CO2 25 01/05/2020   BUN 16 01/05/2020   CREATININE 1.27 (H) 01/05/2020   GLUCOSE 125 (H) 01/05/2020   GFRNONAA >60 01/05/2020   GFRAA >60 01/29/2018   CALCIUM 8.3 (L) 01/05/2020   PROT 5.5 (L) 01/05/2020   ALBUMIN 2.1 (L) 01/05/2020   BILITOT 0.5  01/05/2020   ALKPHOS 74 01/05/2020   AST 19 01/05/2020   ALT 13 01/05/2020   ANIONGAP 6 01/05/2020    CBG (last 3)  Recent Labs    01/04/20 1603 01/04/20 2138 01/05/20 0733  GLUCAP 69* 186* 94     GFR: Estimated Creatinine Clearance: 85.9 mL/min (A) (by C-G formula based on SCr of 1.27 mg/dL (H)).  Coagulation Profile: No results for input(s): INR, PROTIME in the last 168 hours.  Recent Results (from the past 240 hour(s))  Respiratory Panel by RT PCR (Flu A&B, Covid) - Nasopharyngeal Swab     Status: None   Collection Time: 12/31/19  2:00 PM   Specimen: Nasopharyngeal Swab  Result Value Ref Range Status   SARS Coronavirus 2 by RT PCR NEGATIVE NEGATIVE Final    Comment: (NOTE) SARS-CoV-2 target nucleic acids are NOT DETECTED.  The SARS-CoV-2 RNA is generally detectable in upper respiratoy specimens during the acute phase of infection. The lowest concentration of SARS-CoV-2 viral copies this assay can detect is 131 copies/mL. A negative result does not preclude SARS-Cov-2 infection and should not be used as the sole basis for treatment or other patient management decisions. A negative result may occur with  improper specimen collection/handling, submission of specimen other than nasopharyngeal swab, presence of viral mutation(s) within the areas targeted by this assay, and inadequate number of viral copies (<131 copies/mL). A negative result must be combined  with clinical observations, patient history, and epidemiological information. The expected result is Negative.  Fact Sheet for Patients:  PinkCheek.be  Fact Sheet for Healthcare Providers:  GravelBags.it  This test is no t yet approved or cleared by the [place] FDA and  has been authorized for detection and/or diagnosis of SARS-CoV-2 by FDA under an Emergency Use Authorization (EUA). This EUA will remain  in effect (meaning this test can be used) for  the duration of the COVID-19 declaration under Section 564(b)(1) of the Act, 21 U.S.C. section 360bbb-3(b)(1), unless the authorization is terminated or revoked sooner.     Influenza A by PCR NEGATIVE NEGATIVE Final   Influenza B by PCR NEGATIVE NEGATIVE Final    Comment: (NOTE) The Xpert Xpress SARS-CoV-2/FLU/RSV assay is intended as an aid in  the diagnosis of influenza from Nasopharyngeal swab specimens and  should not be used as a sole basis for treatment. Nasal washings and  aspirates are unacceptable for Xpert Xpress SARS-CoV-2/FLU/RSV  testing.  Fact Sheet for Patients: PinkCheek.be  Fact Sheet for Healthcare Providers: GravelBags.it  This test is not yet approved or cleared by the [place] FDA and  has been authorized for detection and/or diagnosis of SARS-CoV-2 by  FDA under an Emergency Use Authorization (EUA). This EUA will remain  in effect (meaning this test can be used) for the duration of the  Covid-19 declaration under Section 564(b)(1) of the Act, 21  U.S.C. section 360bbb-3(b)(1), unless the authorization is  terminated or revoked. Performed at [facility], [place] [address] [place], [place] [address]   SARS Coronavirus 2 by RT PCR (hospital order, performed in [facility] hospital lab) Nasopharyngeal Nasopharyngeal Swab     Status: None   Collection Time: 01/03/20  5:09 PM   Specimen: Nasopharyngeal Swab  Result Value Ref Range Status   SARS Coronavirus 2 NEGATIVE NEGATIVE Final    Comment: (NOTE) SARS-CoV-2 target nucleic acids are NOT DETECTED.  The SARS-CoV-2 RNA is generally detectable in upper and lower respiratory specimens during the acute phase of infection. The lowest concentration of SARS-CoV-2 viral copies this assay can detect is 250 copies / mL. A negative result does not preclude SARS-CoV-2 infection and should not be used as the sole basis for treatment or other patient  management decisions.  A negative result may occur with improper specimen collection / handling, submission of specimen other than nasopharyngeal swab, presence of viral mutation(s) within the areas targeted by this assay, and inadequate number of viral copies (<250 copies / mL). A negative result must be combined with clinical observations, patient history, and epidemiological information.  Fact Sheet for Patients:   StrictlyIdeas.no  Fact Sheet for Healthcare Providers: BankingDealers.co.za  This test is not yet approved or  cleared by the [place] FDA and has been authorized for detection and/or diagnosis of SARS-CoV-2 by FDA under an Emergency Use Authorization (EUA).  This EUA will remain in effect (meaning this test can be used) for the duration of the COVID-19 declaration under Section 564(b)(1) of the Act, 21 U.S.C. section 360bbb-3(b)(1), unless the authorization is terminated or revoked sooner.  Performed at [facility], [place] [address], [place], [place] [address]         Radiology Studies: US RENAL  Result Date: 01/04/2020 CLINICAL DATA:  Acute kidney injury EXAM: RENAL / URINARY TRACT ULTRASOUND COMPLETE COMPARISON:  None. FINDINGS: Right Kidney: Renal measurements: 10.5 x 5.5 x 5.7 cm = volume: 173.0 mL. Echogenicity and renal cortical  thickness are within normal limits. No mass, perinephric fluid, or hydronephrosis visualized. No sonographically demonstrable calculus or ureterectasis. Left Kidney: Renal measurements: 12.6 x 5.5 x 5.2 cm = volume: 188.4 mL. Echogenicity and renal cortical thickness are within normal limits. No mass, perinephric fluid, or hydronephrosis visualized. No sonographically demonstrable calculus or ureterectasis. Bladder: Urinary bladder appears somewhat distended. Appears normal for degree of bladder distention. Other: None. IMPRESSION: Urinary bladder appears somewhat distended. No lesion  reparable to the urinary bladder is seen on this study. Kidneys appear within normal limits bilaterally. Electronically Signed   By: Lowella Grip III M.D.   On: 01/04/2020 17:50   VAS [place] LOWER EXTREMITY VENOUS (DVT)  Result Date: 01/04/2020  Lower Venous DVT Study Indications: Edema.  Risk Factors: None identified. Limitations: Body habitus and poor ultrasound/tissue interface. Comparison Study: No prior studies. Performing Technologist: Oliver Hum RVT  Examination Guidelines: A complete evaluation includes B-mode imaging, spectral Doppler, color Doppler, and power Doppler as needed of all accessible portions of each vessel. Bilateral testing is considered an integral part of a complete examination. Limited examinations for reoccurring indications may be performed as noted. The reflux portion of the exam is performed with the patient in reverse Trendelenburg.  +---------+---------------+---------+-----------+----------+--------------+ RIGHT    CompressibilityPhasicitySpontaneityPropertiesThrombus Aging +---------+---------------+---------+-----------+----------+--------------+ CFV      Full           Yes      Yes                                 +---------+---------------+---------+-----------+----------+--------------+ SFJ      Full                                                        +---------+---------------+---------+-----------+----------+--------------+ FV Prox  Full                                                        +---------+---------------+---------+-----------+----------+--------------+ FV Mid   Full                                                        +---------+---------------+---------+-----------+----------+--------------+ FV DistalFull                                                        +---------+---------------+---------+-----------+----------+--------------+ PFV      Full                                                         +---------+---------------+---------+-----------+----------+--------------+ POP      Full           Yes  Yes                                 +---------+---------------+---------+-----------+----------+--------------+ PTV      Full                                                        +---------+---------------+---------+-----------+----------+--------------+ PERO     Full                                                        +---------+---------------+---------+-----------+----------+--------------+   +---------+---------------+---------+-----------+----------+--------------+ LEFT     CompressibilityPhasicitySpontaneityPropertiesThrombus Aging +---------+---------------+---------+-----------+----------+--------------+ CFV      Full           Yes      Yes                                 +---------+---------------+---------+-----------+----------+--------------+ SFJ      Full                                                        +---------+---------------+---------+-----------+----------+--------------+ FV Prox  Full                                                        +---------+---------------+---------+-----------+----------+--------------+ FV Mid   Full                                                        +---------+---------------+---------+-----------+----------+--------------+ FV DistalFull                                                        +---------+---------------+---------+-----------+----------+--------------+ PFV      Full                                                        +---------+---------------+---------+-----------+----------+--------------+ POP      Full           Yes      Yes                                 +---------+---------------+---------+-----------+----------+--------------+ PTV      Full                                                         +---------+---------------+---------+-----------+----------+--------------+  PERO     Full                                                        +---------+---------------+---------+-----------+----------+--------------+     Summary: RIGHT: - There is no evidence of deep vein thrombosis in the lower extremity. However, portions of this examination were limited- see technologist comments above.  - No cystic structure found in the popliteal fossa.  LEFT: - There is no evidence of deep vein thrombosis in the lower extremity. However, portions of this examination were limited- see technologist comments above.  - No cystic structure found in the popliteal fossa.  *See table(s) above for measurements and observations. Electronically signed by Curt Jews MD on 01/04/2020 at 2:52:48 PM.    Final         Scheduled Meds: . atenolol  25 mg Oral Daily  . diltiazem  180 mg Oral Daily  . enoxaparin (LOVENOX) injection  65 mg Subcutaneous Daily  . furosemide  80 mg Intravenous Q12H  . gabapentin  800 mg Oral BID  . insulin aspart  0-5 Units Subcutaneous QHS  . insulin aspart  0-9 Units Subcutaneous TID WC  . insulin glargine  25 Units Subcutaneous BID  . pantoprazole  40 mg Oral Daily  . potassium chloride  20 mEq Oral Daily  . sodium chloride flush  3 mL Intravenous Q12H  . sodium chloride flush  3 mL Intravenous Q12H   Continuous Infusions: . sodium chloride    . albumin human 25 g (01/05/20 0137)     LOS: 1 day     Cordelia Poche, MD [facility] 01/05/2020, 9:05 AM  If 7PM-7AM, please contact night-coverage [email or web address]

## 2020-01-05 NOTE — Progress Notes (Signed)
Received call from radiology about pt's abd xray. Result shows dilated sigmoid colon, sigmoid volvulus, rec' CT abd and pelvis with oral and IV contrast. Lonny Prude, MD made aware

## 2020-01-06 DIAGNOSIS — R601 Generalized edema: Secondary | ICD-10-CM

## 2020-01-06 DIAGNOSIS — R52 Pain, unspecified: Secondary | ICD-10-CM | POA: Diagnosis not present

## 2020-01-06 DIAGNOSIS — N049 Nephrotic syndrome with unspecified morphologic changes: Secondary | ICD-10-CM

## 2020-01-06 DIAGNOSIS — I5189 Other ill-defined heart diseases: Secondary | ICD-10-CM | POA: Diagnosis not present

## 2020-01-06 DIAGNOSIS — R140 Abdominal distension (gaseous): Secondary | ICD-10-CM | POA: Diagnosis not present

## 2020-01-06 DIAGNOSIS — I10 Essential (primary) hypertension: Secondary | ICD-10-CM | POA: Diagnosis not present

## 2020-01-06 LAB — CBC
HCT: 29.3 % — ABNORMAL LOW (ref 39.0–52.0)
Hemoglobin: 9.2 g/dL — ABNORMAL LOW (ref 13.0–17.0)
MCH: 25.7 pg — ABNORMAL LOW (ref 26.0–34.0)
MCHC: 31.4 g/dL (ref 30.0–36.0)
MCV: 81.8 fL (ref 80.0–100.0)
Platelets: 276 10*3/uL (ref 150–400)
RBC: 3.58 MIL/uL — ABNORMAL LOW (ref 4.22–5.81)
RDW: 15 % (ref 11.5–15.5)
WBC: 3 10*3/uL — ABNORMAL LOW (ref 4.0–10.5)
nRBC: 0 % (ref 0.0–0.2)

## 2020-01-06 LAB — RENAL FUNCTION PANEL
Albumin: 2.8 g/dL — ABNORMAL LOW (ref 3.5–5.0)
Anion gap: 8 (ref 5–15)
BUN: 17 mg/dL (ref 6–20)
CO2: 24 mmol/L (ref 22–32)
Calcium: 8.8 mg/dL — ABNORMAL LOW (ref 8.9–10.3)
Chloride: 103 mmol/L (ref 98–111)
Creatinine, Ser: 1.17 mg/dL (ref 0.61–1.24)
GFR, Estimated: >60 mL/min (ref >60)
Glucose, Bld: 84 mg/dL (ref 70–99)
Phosphorus: 3.7 mg/dL (ref 2.5–4.6)
Potassium: 3.3 mmol/L — ABNORMAL LOW (ref 3.5–5.1)
Sodium: 135 mmol/L (ref 135–145)

## 2020-01-06 LAB — GLUCOSE, CAPILLARY
Glucose-Capillary: 58 mg/dL — ABNORMAL LOW (ref 70–99)
Glucose-Capillary: 94 mg/dL (ref 70–99)
Glucose-Capillary: 74 mg/dL (ref 70–99)
Glucose-Capillary: 84 mg/dL (ref 70–99)
Glucose-Capillary: 93 mg/dL (ref 70–99)

## 2020-01-06 LAB — SEDIMENTATION RATE: Sed Rate: 86 mm/hr — ABNORMAL HIGH (ref 0–16)

## 2020-01-06 LAB — C-REACTIVE PROTEIN: CRP: 0.7 mg/dL (ref <1.0)

## 2020-01-06 LAB — ALDOSTERONE + RENIN ACTIVITY W/ RATIO
ALDO / PRA Ratio: <2.2 (ref 0.0–30.0)
Aldosterone: <1 ng/dL (ref 0.0–30.0)
PRA LC/MS/MS: 0.461 ng/mL/hr (ref 0.167–5.380)

## 2020-01-06 MED ORDER — INSULIN GLARGINE 100 UNIT/ML ~~LOC~~ SOLN
20.0000 [IU] | Freq: Two times a day (BID) | SUBCUTANEOUS | Status: DC
  Administered 2020-01-07: 20 [IU] via SUBCUTANEOUS
  Administered 2020-01-07: 10 [IU] via SUBCUTANEOUS
  Filled 2020-01-06 (×4): qty 0.2

## 2020-01-06 MED ORDER — FUROSEMIDE 10 MG/ML IJ SOLN
80.0000 mg | Freq: Three times a day (TID) | INTRAMUSCULAR | Status: DC
  Administered 2020-01-06 – 2020-01-09 (×9): 80 mg via INTRAVENOUS
  Filled 2020-01-06 (×10): qty 8

## 2020-01-06 MED ORDER — POTASSIUM CHLORIDE CRYS ER 20 MEQ PO TBCR
40.0000 meq | EXTENDED_RELEASE_TABLET | Freq: Every day | ORAL | Status: DC
  Administered 2020-01-06: 40 meq via ORAL
  Filled 2020-01-06: qty 2

## 2020-01-06 MED ORDER — POTASSIUM CHLORIDE CRYS ER 20 MEQ PO TBCR
40.0000 meq | EXTENDED_RELEASE_TABLET | Freq: Two times a day (BID) | ORAL | Status: DC
  Administered 2020-01-06 – 2020-01-07 (×2): 40 meq via ORAL
  Filled 2020-01-06 (×2): qty 2

## 2020-01-06 MED ORDER — OXYCODONE-ACETAMINOPHEN 5-325 MG PO TABS
1.0000 | ORAL_TABLET | ORAL | Status: DC | PRN
  Administered 2020-01-06: 1 via ORAL
  Administered 2020-01-07 – 2020-01-13 (×17): 2 via ORAL
  Filled 2020-01-06 (×12): qty 2
  Filled 2020-01-06: qty 1
  Filled 2020-01-06 (×3): qty 2
  Filled 2020-01-06: qty 1
  Filled 2020-01-06 (×2): qty 2

## 2020-01-06 NOTE — TOC Progression Note (Signed)
Transition of Care [facility]) - Progression Note    Patient Details  Name: SHADE KALEY MRN: [id number] Date of Birth: 05-Apr-1960  [facility] of [facility]) CM/SW [place], [place] Phone Number: 01/06/2020, 11:56 AM  Clinical Narrative:     CSW called patients insurance. Patients insurance is approved through 11/15.  Patient has SNF bed at [facility]. Insurance authorization has been approved.  CSW will continue to follow.  Expected Discharge Plan: [place] Barriers to Discharge: Continued Medical Work up  Expected Discharge Plan and Services Expected Discharge Plan: [place] Choice: [place] arrangements for the past 2 months: Single Family Home                                       Social Determinants of Health (SDOH) Interventions    Readmission Risk Interventions No flowsheet data found.

## 2020-01-06 NOTE — Progress Notes (Signed)
Nephrology Follow-Up Consult note   Assessment/Recommendations: [name] is a/an 59 y.o. male with a past medical history significant for TN, poorly controlled DM, chronic leukopenia, h/o CVA, h/o SUD, admitted for hypertensive urgency, hypokalemia, and mild rhabdomyolysis.    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 Patient did not make as much urine as expected today, will need increased diuresis. Discussed with nursing and patient had condom cath placed around midnight last night, making UOP accurate. Presently in bag only about 75-100cc of urine. UOP only 1.35 or 0.74mL/kg/hr. Weight has appropriately decreased from 301.6 lbs to 297.6 lbs. Nephrotic labs complete: ANA negative, Hep B surface ag and Hep C ab negative. However Free light chains absolute and ratio is mildly elevated: Kappa predominant with 96 mg/L, lambda 45.7, ratio of 2.1. Ratio is normal, expect that mild elevation due to AKI, not nearly enough to be concerning for MM or amyloidosis, especially in setting of explaining nephrosis from 32 years of uncontrolled DM. Recommend outpatient follow up for light chains when AKI resolved in 4-6 weeks. -Increase lasix 80mg  IV q8h -Monitor Daily I/Os, Daily weight   AKI (resolved): Most likely due to hypertensive emergency and hemodynamic changes in setting of ACEi. 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 -Continue to monitor daily Cr, Dose meds for GFR  Hypertension: mild elevation, BP acceptable today and improving. Most recently 144/87 Continue atenolol, dilt,  hydral prn, also expect to improve with diuresis. Continue to monitor.   LE edema: continue diuresis, see above.  Hypokalemia: Mildly low today to 3.3. Recommend repletion with KCl 40 mEq today. Continue monitoring electrolytes with diuresis, including Mg.  -KCl 40 mEq qd  Anemia: possibly  iron deficiency. CBC not obtained today. MCV WNL at 82, but b/l 11-12. hgb dropped during admission likely due to blood draws to most recently 9.7. Recommend iron labs, can be done outpatient. -Transfuse for Hgb<7 g/dL -No role for ESA in this setting -Daily CBC  Uncontrolled Diabetes Mellitus Type 2 with Hyperglycemia BG well controlled in hospital from 94-160 yesterday, most recent 96, some hypoglycemia to 58. Consider decreasing glargine to 45U to prevent hypoglycemia. Suspect patient is easily controlled in hospital with diet and medication compliance. Recommend SGLT2i farxiga as outpatient, as this provides benefits of cardiac and renal protection in addition to A1c lowering. - per primary team  Recommendations conveyed to primary service.   [name], MD PGY-2 01/06/2020 8:40 AM  ___________________________________________________________  CC: AKI  Interval History/Subjective: patient very somnolent this morning, no medications to cause this on MAR. Discussed with Dr. Lonny Prude and patient has periods of somnolence throughout the day and then returns to normal. BG checked 5 minutes prior to my examination and was normal at 96. Discussed with nursing, will continue to monitor patient.  Medications:  Current Facility-Administered Medications  Medication Dose Route Frequency Provider Last Rate Last Admin  . 0.9 %  sodium chloride infusion  250 mL Intravenous PRN Opyd, Ilene Qua, MD      . acetaminophen (TYLENOL) tablet 650 mg  650 mg Oral Q6H PRN Opyd, Ilene Qua, MD   650 mg at 01/06/20 1610   Or  . acetaminophen (TYLENOL) suppository 650 mg  650 mg Rectal Q6H PRN Opyd, Ilene Qua, MD      . atenolol (TENORMIN) tablet 25 mg  25 mg Oral Daily Opyd, Timothy S,  MD   25 mg at 01/05/20 0945  . diltiazem (CARDIZEM CD) 24 hr capsule 180 mg  180 mg Oral Daily Opyd, Ilene Qua, MD   180 mg at 01/05/20 0944  . enoxaparin (LOVENOX) injection 65 mg  65 mg Subcutaneous Daily Leward Quan, RPH    65 mg at 01/05/20 0942  . furosemide (LASIX) injection 80 mg  80 mg Intravenous Q12H Rosita Fire, MD   80 mg at 01/06/20 0332  . gabapentin (NEURONTIN) capsule 800 mg  800 mg Oral BID Opyd, Ilene Qua, MD   800 mg at 01/05/20 2122  . hydrALAZINE (APRESOLINE) tablet 50 mg  50 mg Oral Q6H PRN Opyd, Ilene Qua, MD      . insulin aspart (novoLOG) injection 0-5 Units  0-5 Units Subcutaneous QHS Opyd, Ilene Qua, MD   2 Units at 01/01/20 2231  . insulin aspart (novoLOG) injection 0-9 Units  0-9 Units Subcutaneous TID WC Opyd, Ilene Qua, MD   2 Units at 01/05/20 1720  . insulin glargine (LANTUS) injection 25 Units  25 Units Subcutaneous BID Para Skeans, MD   25 Units at 01/05/20 2122  . LORazepam (ATIVAN) injection 0.5 mg  0.5 mg Intravenous Once PRN Shela Leff, MD      . oxyCODONE-acetaminophen (PERCOCET/ROXICET) 5-325 MG per tablet 1-2 tablet  1-2 tablet Oral Q4H PRN Mariel Aloe, MD      . pantoprazole (PROTONIX) EC tablet 40 mg  40 mg Oral Daily Para Skeans, MD   40 mg at 01/05/20 0945  . polyethylene glycol (MIRALAX / GLYCOLAX) packet 17 g  17 g Oral Daily PRN Opyd, Ilene Qua, MD      . potassium chloride SA (KLOR-CON) CR tablet 40 mEq  40 mEq Oral Daily Rosita Fire, MD      . sodium chloride flush (NS) 0.9 % injection 3 mL  3 mL Intravenous Q12H Opyd, Ilene Qua, MD   3 mL at 01/05/20 2124  . sodium chloride flush (NS) 0.9 % injection 3 mL  3 mL Intravenous Q12H Opyd, Ilene Qua, MD   3 mL at 01/05/20 2123  . sodium chloride flush (NS) 0.9 % injection 3 mL  3 mL Intravenous PRN Opyd, Ilene Qua, MD          Review of Systems: 10 systems reviewed and negative except per interval history/subjective  Physical Exam: Vitals:   01/05/20 2053 01/06/20 0334  BP: (!) 176/96 (!) 144/87  Pulse: 76 75  Resp: 18 18  Temp: 98 F (36.7 C) 97.8 F (36.6 C)  SpO2: 100% 100%   No intake/output data recorded.  Intake/Output Summary (Last 24 hours) at 01/06/2020 0840 Last  data filed at 01/06/2020 0636 Gross per 24 hour  Intake 343 ml  Output 1350 ml  Net -1007 ml   Constitutional: appears older than age, resting comfortably in bed, no acute distress ENMT: ears and nose without scars or lesions, MMM CV: normal rate, no JVD, significant peripheral edema 2+ to mid thigh Respiratory: clear to auscultation, normal work of breathing Gastrointestinal: soft, non-tender, no palpable masses or hernias Skin: no visible lesions or rashes Psych: somnolent   Test Results I personally reviewed new and old clinical labs and radiology tests Lab Results  Component Value Date   NA 135 01/06/2020   K 3.3 (L) 01/06/2020   CL 103 01/06/2020   CO2 24 01/06/2020   BUN 17 01/06/2020   CREATININE 1.17 01/06/2020   CALCIUM 8.8 (L)  01/06/2020   ALBUMIN 2.8 (L) 01/06/2020   PHOS 3.7 01/06/2020

## 2020-01-06 NOTE — Progress Notes (Signed)
   Covid-[facility] Vaccination [facility]  Name:  [name]    MRN: [id number] DOB: 1960/12/30  01/06/2020  [name] was observed post Covid-19 immunization for 15 minutes without incident. He was provided with Vaccine Information Sheet and instruction to access the V-Safe system.   [name] was instructed to call 911 with any severe reactions post vaccine: Marland Kitchen Difficulty breathing  . Swelling of face and throat  . A fast heartbeat  . A bad rash all over body  . Dizziness and weakness   Immunizations Administered    Name Date Dose VIS Date Route   Pfizer COVID-19 Vaccine 01/03/2020 12:06 PM 0.3 mL 12/15/2019 Intramuscular   Manufacturer: [place]   Lot: [id number]   [place]: [id number]

## 2020-01-06 NOTE — Progress Notes (Signed)
Patient having stomach cramps and back pain this morning. Will give patient pain medication. Patient had 1 loose stool after 2300.

## 2020-01-06 NOTE — Progress Notes (Signed)
Bladder scanned pt per MD orders @ 1017. A volume of 660 mL was shown. MD ordered in and out cath. Pt was educated on the procedure. Pt refused in and out cath and stated "I absolutely do not want that". MD made aware.

## 2020-01-06 NOTE — Progress Notes (Signed)
PROGRESS NOTE    [name]  PJA:[id number] DOB: [birth year] DOA: 12/31/2019 PCP: [name], NP   Brief Narrative: [name] is a 59 y.o. malewith medical history significant forhypertension, poorly controlled diabetes mellitus, chronic leukopenia, history of CVA, and history of substance abuse. Patient presented secondary to body aches with evidence of mild rhabdomyolysis and volume overload, found to have nephrotic syndrome and AKI. Started on IV diuresis.   Assessment & Plan:   Principal Problem:   Generalized body aches Active Problems:   Hypertension   Substance-related disorder (HCC)   Hypokalemia   Non-traumatic rhabdomyolysis   Grade I diastolic dysfunction   Anemia   Hypokalemia due to loss of potassium   Nephrotic syndrome   Anasarca   Nephrotic syndrome Concern this is secondary to patient's underlying diabetes mellitus. Nephrology consulted and managing. Urinalysis significant for large protein/glucose with a urine protein/creatinine of 3.51. Serum albumin of around 2. Albumin IV given. -Nephrology recommendations: Lasix IV  Abdominal distension Significant. Associated abdominal pain. Patient states he is not passing gas. Multiple episodes of watery stool. Decreased bowel sounds. No obstruction or volvulus on CT abdomen (11/10). Possibly some colitis, but if so, mild. No leukocytosis. CT scan also with urinary distension -GI pathogen panel -ESR, CRP -Serial abdominal exams -Bladder scan  AKI on CKD stage III AKI secondary to hypertensive emergency/ACE inhibitor use. Baseline creatinine of about 1.1. Peak creatinine of 1.42. benazepril held. Back towards baseline.  Chronic diastolic heart dysfunction Stable.  Primary hypertension Hypertensive emergency on admission. Blood pressure better controlled now but still elevated at times. -Continue atenolol and diltiazem  Anemia Normocytic. Anemia panel suggests possible mixed picture. Stable. No  evidence of bleed. In setting of diarrhea as well as chronic kidney disease. -FOBT pending  Hypokalemia -Continue potassium supplementation -BMP daily  Diabetes mellitus, type 2 Uncontrolled with hyperglycemia and hypoglycemia Patient is on Lantus 40 units qhs and Januemet as an outpatient. -Decrease to Lantus 20 units BID and SSI  Bicuspid aortic valve Moderate aortic valve stenosis Associated LVH without evidence of LV systolic dysfunction. No LV dilation. -Cardiology follow-up outpatient   DVT prophylaxis: Lovenox Code Status:   Code Status: Full Code Family Communication: None at bedside Disposition Plan: Discharge to SNF pending nephrology recommendations in addition to improvement of diarrhea. Likely several days   Consultants:   Nephrology  Procedures:   TRANSTHORACIC ECHOCARDIOGRAM (01/01/2020) IMPRESSIONS    1. Left ventricular ejection fraction, by estimation, is 65 to 70%. The  left ventricle has normal function. The left ventricle has no regional  wall motion abnormalities. There is moderate left ventricular hypertrophy.  Hyperdense myocardium, consider  hypertensive cardiomyopathy versus infilrative cardiomyopathy. Left  ventricular diastolic parameters are consistent with Grade I diastolic  dysfunction (impaired relaxation).  2. Right ventricular systolic function is normal. The right ventricular  size is normal. There is mildly elevated pulmonary artery systolic  pressure. The estimated right ventricular systolic pressure is 34.1 mmHg.  3. Left atrial size was mildly dilated.  4. A small pericardial effusion is present. The pericardial effusion is  circumferential.  5. The mitral valve is grossly normal. Mild mitral valve regurgitation.  6. The aortic valve is functionally bicuspid. There is moderate  calcification of the aortic valve. Aortic valve regurgitation is trivial.  Moderate aortic valve stenosis. Aortic valve mean gradient measures 13.0    mmHg. Aortic valve Vmax measures 2.37 m/s.  Dimentionless index 0.32.  7. The inferior vena cava is dilated in size  with <50% respiratory  variability, suggesting right atrial pressure of 15 mmHg.   Antimicrobials:  None    Subjective: Continued abdominal pain. States he has not been passing gas. Multiple watery stools  Objective: Vitals:   01/05/20 1603 01/05/20 2053 01/06/20 0334 01/06/20 1051  BP: (!) 163/114 (!) 176/96 (!) 144/87 (!) 165/101  Pulse: 71 76 75 68  Resp: _0 Temp: 97.7 F (36.5 C) 98 F (36.7 C) 97.8 F (36.6 C)   TempSrc: Oral Oral Oral   SpO2: 98% 100% 100% 96%  Weight:   135 kg   Height:        Intake/Output Summary (Last 24 hours) at 01/06/2020 1147 Last data filed at 01/06/2020 1052 Gross per 24 hour  Intake 343 ml  Output 1350 ml  Net -1007 ml   Filed Weights   01/04/20 0500 01/05/20 0511 01/06/20 0334  Weight: 135.1 kg (!) 136.8 kg 135 kg    Examination:  General exam: Appears calm and comfortable. Not toxic appearing Respiratory system: Clear to auscultation. Respiratory effort normal. Cardiovascular system: S1 & S2 heard, RRR. No murmurs, rubs, gallops or clicks. Gastrointestinal system: Abdomen is severely distended, soft and mildly tender. Decreased bowel sounds heard. Central nervous system: Alert. Musculoskeletal: 2+ BLE edema. No calf tenderness Skin: No cyanosis. No rashes Psychiatry: Judgement and insight appear normal. Mood & affect appropriate.     Data Reviewed: I have personally reviewed following labs and imaging studies  CBC Lab Results  Component Value Date   WBC 3.0 (L) 01/06/2020   RBC 3.58 (L) 01/06/2020   HGB 9.2 (L) 01/06/2020   HCT 29.3 (L) 01/06/2020   MCV 81.8 01/06/2020   MCH 25.7 (L) 01/06/2020   PLT 276 01/06/2020   MCHC 31.4 01/06/2020   RDW 15.0 01/06/2020   LYMPHSABS 1.2 01/04/2020   MONOABS 0.5 01/04/2020   EOSABS 0.2 01/04/2020   BASOSABS 0.0 62/26/3335     Last metabolic  panel Lab Results  Component Value Date   NA 135 01/06/2020   K 3.3 (L) 01/06/2020   CL 103 01/06/2020   CO2 24 01/06/2020   BUN 17 01/06/2020   CREATININE 1.17 01/06/2020   GLUCOSE 84 01/06/2020   GFRNONAA >60 01/06/2020   GFRAA >60 01/29/2018   CALCIUM 8.8 (L) 01/06/2020   PHOS 3.7 01/06/2020   PROT 5.5 (L) 01/05/2020   ALBUMIN 2.8 (L) 01/06/2020   BILITOT 0.5 01/05/2020   ALKPHOS 74 01/05/2020   AST 19 01/05/2020   ALT 13 01/05/2020   ANIONGAP 8 01/06/2020    CBG (last 3)  Recent Labs    01/06/20 0843 01/06/20 0934 01/06/20 1119  GLUCAP 58* 94 74     GFR: Estimated Creatinine Clearance: 92.5 mL/min (by C-G formula based on SCr of 1.17 mg/dL).  Coagulation Profile: No results for input(s): INR, PROTIME in the last 168 hours.  Recent Results (from the past 240 hour(s))  Respiratory Panel by RT PCR (Flu A&B, Covid) - Nasopharyngeal Swab     Status: None   Collection Time: 12/31/19  2:00 PM   Specimen: Nasopharyngeal Swab  Result Value Ref Range Status   SARS Coronavirus 2 by RT PCR NEGATIVE NEGATIVE Final    Comment: (NOTE) SARS-CoV-2 target nucleic acids are NOT DETECTED.  The SARS-CoV-2 RNA is generally detectable in upper respiratoy specimens during the acute phase of infection. The lowest concentration of SARS-CoV-2 viral copies this assay can detect is 131 copies/mL. A negative result does not preclude  SARS-Cov-2 infection and should not be used as the sole basis for treatment or other patient management decisions. A negative result may occur with  improper specimen collection/handling, submission of specimen other than nasopharyngeal swab, presence of viral mutation(s) within the areas targeted by this assay, and inadequate number of viral copies (<131 copies/mL). A negative result must be combined with clinical observations, patient history, and epidemiological information. The expected result is Negative.  Fact Sheet for Patients:    PinkCheek.be  Fact Sheet for Healthcare Providers:  GravelBags.it  This test is no t yet approved or cleared by the [place] FDA and  has been authorized for detection and/or diagnosis of SARS-CoV-2 by FDA under an Emergency Use Authorization (EUA). This EUA will remain  in effect (meaning this test can be used) for the duration of the COVID-19 declaration under Section 564(b)(1) of the Act, 21 U.S.C. section 360bbb-3(b)(1), unless the authorization is terminated or revoked sooner.     Influenza A by PCR NEGATIVE NEGATIVE Final   Influenza B by PCR NEGATIVE NEGATIVE Final    Comment: (NOTE) The Xpert Xpress SARS-CoV-2/FLU/RSV assay is intended as an aid in  the diagnosis of influenza from Nasopharyngeal swab specimens and  should not be used as a sole basis for treatment. Nasal washings and  aspirates are unacceptable for Xpert Xpress SARS-CoV-2/FLU/RSV  testing.  Fact Sheet for Patients: PinkCheek.be  Fact Sheet for Healthcare Providers: GravelBags.it  This test is not yet approved or cleared by the [place] FDA and  has been authorized for detection and/or diagnosis of SARS-CoV-2 by  FDA under an Emergency Use Authorization (EUA). This EUA will remain  in effect (meaning this test can be used) for the duration of the  Covid-19 declaration under Section 564(b)(1) of the Act, 21  U.S.C. section 360bbb-3(b)(1), unless the authorization is  terminated or revoked. Performed at [facility], [place] [address], [place], [place] [address]   SARS Coronavirus 2 by RT PCR (hospital order, performed in [facility] hospital lab) Nasopharyngeal Nasopharyngeal Swab     Status: None   Collection Time: 01/03/20  5:09 PM   Specimen: Nasopharyngeal Swab  Result Value Ref Range Status   SARS Coronavirus 2 NEGATIVE NEGATIVE Final    Comment: (NOTE) SARS-CoV-2  target nucleic acids are NOT DETECTED.  The SARS-CoV-2 RNA is generally detectable in upper and lower respiratory specimens during the acute phase of infection. The lowest concentration of SARS-CoV-2 viral copies this assay can detect is 250 copies / mL. A negative result does not preclude SARS-CoV-2 infection and should not be used as the sole basis for treatment or other patient management decisions.  A negative result may occur with improper specimen collection / handling, submission of specimen other than nasopharyngeal swab, presence of viral mutation(s) within the areas targeted by this assay, and inadequate number of viral copies (<250 copies / mL). A negative result must be combined with clinical observations, patient history, and epidemiological information.  Fact Sheet for Patients:   StrictlyIdeas.no  Fact Sheet for Healthcare Providers: BankingDealers.co.za  This test is not yet approved or  cleared by the [place] FDA and has been authorized for detection and/or diagnosis of SARS-CoV-2 by FDA under an Emergency Use Authorization (EUA).  This EUA will remain in effect (meaning this test can be used) for the duration of the COVID-19 declaration under Section 564(b)(1) of the Act, 21 U.S.C. section 360bbb-3(b)(1), unless the authorization is terminated or revoked sooner.  Performed at [facility]  [facility], [place] [address] [place], [place] [address]         Radiology Studies: CT ABDOMEN PELVIS W CONTRAST  Result Date: 01/06/2020 CLINICAL DATA:  59 year male with abdominal distension. Concern for bowel obstruction. EXAM: CT ABDOMEN AND PELVIS WITH CONTRAST TECHNIQUE: Multidetector CT imaging of the abdomen and pelvis was performed using the standard protocol following bolus administration of intravenous contrast. CONTRAST:  113mL OMNIPAQUE IOHEXOL 300 MG/ML  SOLN COMPARISON:  CT of the chest abdomen pelvis dated  09/28/2017. FINDINGS: Evaluation of this exam is limited due to respiratory motion artifact. Lower chest: Partially visualized small pericardial effusion. There is mild dilatation of the visualized pulmonary vasculature, likely mild congestion. No intra-abdominal free air or free fluid. Hepatobiliary: Probable mild fatty liver. No intrahepatic biliary duct dilatation. There is a 15 mm stone in the neck of the gallbladder. No pericholecystic fluid or evidence of acute cholecystitis by CT. Ultrasound may provide better evaluation if there is high clinical concern for acute cholecystitis. Pancreas: There is moderate atrophy of the pancreas. No active inflammatory changes. No dilatation of the main pancreatic duct. Spleen: Normal in size without focal abnormality. Adrenals/Urinary Tract: The adrenal glands unremarkable. There is no hydronephrosis on either side. There is symmetric enhancement and excretion of contrast by both kidneys. The visualized ureters and urinary bladder appear unremarkable. Stomach/Bowel: There is air distended colon without evidence of obstruction or twisting. There is segmental thickening of the distal transverse colon (14/3) which may be related to underdistention. Mild focal colitis is less likely. Clinical correlation is recommended. There is apparent soft tissue thickening with possible shouldering within the lumen of this segment of the colon concerning for a mass. Further evaluation with colonoscopy after resolution of acute symptoms recommended. There is no bowel obstruction. The appendix is normal. Vascular/Lymphatic: The abdominal aorta and IVC are unremarkable. No portal venous gas. There is no adenopathy. Reproductive: The prostate and seminal vesicles are grossly unremarkable as visualized. Other: There is diffuse subcutaneous edema and anasarca. Musculoskeletal: Degenerative changes of spine. No acute osseous pathology. IMPRESSION: 1. Air distended colon without evidence of  obstruction or twisting. Normal appendix. 2. Apparent soft tissue thickening of the distal transverse colon concerning for a mass. Further evaluation with colonoscopy after resolution of acute symptoms recommended. 3. Cholelithiasis. 4. Small pericardial effusion and findings of mild pulmonary congestion. 5. Diffuse subcutaneous edema and anasarca. Electronically Signed   By: Anner Crete M.D.   On: 01/06/2020 00:04   US RENAL  Result Date: 01/04/2020 CLINICAL DATA:  Acute kidney injury EXAM: RENAL / URINARY TRACT ULTRASOUND COMPLETE COMPARISON:  None. FINDINGS: Right Kidney: Renal measurements: 10.5 x 5.5 x 5.7 cm = volume: 173.0 mL. Echogenicity and renal cortical thickness are within normal limits. No mass, perinephric fluid, or hydronephrosis visualized. No sonographically demonstrable calculus or ureterectasis. Left Kidney: Renal measurements: 12.6 x 5.5 x 5.2 cm = volume: 188.4 mL. Echogenicity and renal cortical thickness are within normal limits. No mass, perinephric fluid, or hydronephrosis visualized. No sonographically demonstrable calculus or ureterectasis. Bladder: Urinary bladder appears somewhat distended. Appears normal for degree of bladder distention. Other: None. IMPRESSION: Urinary bladder appears somewhat distended. No lesion reparable to the urinary bladder is seen on this study. Kidneys appear within normal limits bilaterally. Electronically Signed   By: Lowella Grip III M.D.   On: 01/04/2020 17:50   DG Abd Portable 1V  Result Date: 01/05/2020 CLINICAL DATA:  59 year old male with abdominal distension. EXAM: PORTABLE ABDOMEN -  1 VIEW COMPARISON:  CT Abdomen and Pelvis 09/28/2017. FINDINGS: Portable AP supine view at 1529 hours. Abnormally gas distended sigmoid colon projecting in the central lower abdomen and upper pelvis is dilated to 15 mm, with upstream gas dilatation of the large bowel. The configuration is suspicious for developing sigmoid volvulus. Superimposed retained  stool in the large bowel. No dilated small bowel loops. No pneumoperitoneum is evident on this supine view. No acute osseous abnormality identified. IMPRESSION: Dilated sigmoid colon with upstream gas-filled large bowel suspicious for Sigmoid Volvulus. CT Abdomen and Pelvis (oral and IV contrast preferred) should be confirmatory. These results will be called to the ordering clinician or representative by the Radiologist Assistant, and communication documented in the PACS or [employer]. Electronically Signed   By: Genevie Ann M.D.   On: 01/05/2020 17:49        Scheduled Meds: . atenolol  25 mg Oral Daily  . diltiazem  180 mg Oral Daily  . enoxaparin (LOVENOX) injection  65 mg Subcutaneous Daily  . furosemide  80 mg Intravenous Q8H  . gabapentin  800 mg Oral BID  . insulin aspart  0-5 Units Subcutaneous QHS  . insulin aspart  0-9 Units Subcutaneous TID WC  . insulin glargine  20 Units Subcutaneous BID  . pantoprazole  40 mg Oral Daily  . potassium chloride  40 mEq Oral BID  . sodium chloride flush  3 mL Intravenous Q12H  . sodium chloride flush  3 mL Intravenous Q12H   Continuous Infusions: . sodium chloride       LOS: 2 days     Cordelia Poche, MD [facility] 01/06/2020, 11:47 AM  If 7PM-7AM, please contact night-coverage [email or web address]

## 2020-01-07 DIAGNOSIS — I5189 Other ill-defined heart diseases: Secondary | ICD-10-CM | POA: Diagnosis not present

## 2020-01-07 DIAGNOSIS — R52 Pain, unspecified: Secondary | ICD-10-CM | POA: Diagnosis not present

## 2020-01-07 DIAGNOSIS — I10 Essential (primary) hypertension: Secondary | ICD-10-CM | POA: Diagnosis not present

## 2020-01-07 DIAGNOSIS — R140 Abdominal distension (gaseous): Secondary | ICD-10-CM | POA: Diagnosis not present

## 2020-01-07 LAB — GASTROINTESTINAL PANEL BY PCR, STOOL (REPLACES STOOL CULTURE)

## 2020-01-07 LAB — RENAL FUNCTION PANEL
Albumin: 2.8 g/dL — ABNORMAL LOW (ref 3.5–5.0)
Anion gap: 8 (ref 5–15)
BUN: 18 mg/dL (ref 6–20)
CO2: 26 mmol/L (ref 22–32)
Calcium: 9 mg/dL (ref 8.9–10.3)
Chloride: 103 mmol/L (ref 98–111)
Creatinine, Ser: 1.27 mg/dL — ABNORMAL HIGH (ref 0.61–1.24)
GFR, Estimated: >60 mL/min (ref >60)
Glucose, Bld: 113 mg/dL — ABNORMAL HIGH (ref 70–99)
Phosphorus: 4.2 mg/dL (ref 2.5–4.6)
Potassium: 3.2 mmol/L — ABNORMAL LOW (ref 3.5–5.1)
Sodium: 137 mmol/L (ref 135–145)

## 2020-01-07 LAB — GLUCOSE, CAPILLARY
Glucose-Capillary: 116 mg/dL — ABNORMAL HIGH (ref 70–99)
Glucose-Capillary: 107 mg/dL — ABNORMAL HIGH (ref 70–99)
Glucose-Capillary: 92 mg/dL (ref 70–99)
Glucose-Capillary: 93 mg/dL (ref 70–99)

## 2020-01-07 MED ORDER — NITROGLYCERIN 0.4 MG SL SUBL
SUBLINGUAL_TABLET | SUBLINGUAL | Status: AC
  Administered 2020-01-07: 0.4 mg
  Filled 2020-01-07: qty 1

## 2020-01-07 MED ORDER — INSULIN GLARGINE 100 UNIT/ML ~~LOC~~ SOLN
20.0000 [IU] | Freq: Every day | SUBCUTANEOUS | Status: DC
  Administered 2020-01-08 – 2020-01-15 (×8): 20 [IU] via SUBCUTANEOUS
  Filled 2020-01-07 (×10): qty 0.2

## 2020-01-07 MED ORDER — METOLAZONE 5 MG PO TABS
5.0000 mg | ORAL_TABLET | Freq: Once | ORAL | Status: AC
  Administered 2020-01-07: 5 mg via ORAL
  Filled 2020-01-07: qty 1

## 2020-01-07 MED ORDER — POTASSIUM CHLORIDE CRYS ER 20 MEQ PO TBCR
40.0000 meq | EXTENDED_RELEASE_TABLET | Freq: Three times a day (TID) | ORAL | Status: AC
  Administered 2020-01-07 – 2020-01-08 (×5): 40 meq via ORAL
  Filled 2020-01-07 (×5): qty 2

## 2020-01-07 NOTE — Progress Notes (Signed)
Nephrology Follow-Up Consult note   Assessment/Recommendations: [name] is a/an 59 y.o. male with a past medical history significant for TN, poorly controlled DM, chronic leukopenia, h/o CVA, h/o SUD, admitted for hypertensive urgency, hypokalemia, and mild rhabdomyolysis.    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 Patient has distended bladder, declined I/O cath yesterday. Will obtain bladder scan today, if patient will not allow I/O, will likely need foley as UOP is not adequate for diuresis. UOP 1.675L yesterday. Awaiting today's weight. Patient still has significant b/l LE edema with serous cracks.  -Continue  lasix 80mg  IV q8h -Monitor Daily I/Os, Daily weight  -Bladder scan, place foley if patient will not agree to I/O cath  AKI: Most likely due to hypertensive emergency and hemodynamic changes in setting of ACEi. 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 -Continue to monitor daily Cr, Dose meds for GFR  Hypertension: elevated to 160/101. Expect to improve with diuresis. Continue atenolol, dilt,  hydral prn. Continue to monitor.   LE edema: still not improved. Continue diuresis, see above.  Hypokalemia: Mildly low today to 3.2. Recommend repletion with KCl 40 mEq today BID. Continue monitoring electrolytes with diuresis, including Mg.  -KCl 40 mEq qd BID  Anemia: possibly iron deficiency. Hgb 9.2 yesterday. MCV WNL at 82, but b/l 11-12. hgb dropped during admission likely due to blood draws to most recently 9.7. Recommend iron labs, can be done outpatient. -Transfuse for Hgb<7 g/dL -No role for ESA in this setting -Daily CBC  Uncontrolled Diabetes Mellitus Type 2 with Hyperglycemia BG well controlled in hospital from 94-113 yesterday, most recent 116, some hypoglycemia to 58. Glargine decreased to 20U total daily. Suspect patient is easily controlled in hospital with diet and medication  compliance. Recommend SGLT2i farxiga as outpatient, as this provides benefits of cardiac and renal protection in addition to A1c lowering. - per primary team  Recommendations conveyed to primary service.   [name], MD PGY-2 01/07/2020 9:17 AM  ___________________________________________________________  CC: AKI  Interval History/Subjective: patient reports feeling very tired and not like himself. Suspect worsened by volume overload and bladder obstruction.  Medications:  Current Facility-Administered Medications  Medication Dose Route Frequency Provider Last Rate Last Admin  . 0.9 %  sodium chloride infusion  250 mL Intravenous PRN Opyd, Ilene Qua, MD      . acetaminophen (TYLENOL) tablet 650 mg  650 mg Oral Q6H PRN Opyd, Ilene Qua, MD   650 mg at 01/06/20 8101   Or  . acetaminophen (TYLENOL) suppository 650 mg  650 mg Rectal Q6H PRN Opyd, Ilene Qua, MD      . atenolol (TENORMIN) tablet 25 mg  25 mg Oral Daily Opyd, Ilene Qua, MD   25 mg at 01/06/20 1051  . diltiazem (CARDIZEM CD) 24 hr capsule 180 mg  180 mg Oral Daily Opyd, Ilene Qua, MD   180 mg at 01/06/20 1051  . enoxaparin (LOVENOX) injection 65 mg  65 mg Subcutaneous Daily Leward Quan, RPH   65 mg at 01/06/20 1050  . furosemide (LASIX) injection 80 mg  80 mg Intravenous Q8H Rosita Fire, MD   80 mg at 01/07/20 7510  . gabapentin (NEURONTIN) capsule 800 mg  800 mg Oral BID Opyd, Ilene Qua, MD   800 mg at 01/06/20 2255  . hydrALAZINE (APRESOLINE) tablet 50 mg  50 mg Oral Q6H PRN Opyd, Ilene Qua, MD      .  insulin aspart (novoLOG) injection 0-5 Units  0-5 Units Subcutaneous QHS Opyd, Ilene Qua, MD   2 Units at 01/01/20 2231  . insulin aspart (novoLOG) injection 0-9 Units  0-9 Units Subcutaneous TID WC Opyd, Ilene Qua, MD   2 Units at 01/05/20 1720  . insulin glargine (LANTUS) injection 20 Units  20 Units Subcutaneous BID Mariel Aloe, MD   10 Units at 01/07/20 0012  . LORazepam (ATIVAN) injection 0.5 mg  0.5  mg Intravenous Once PRN Shela Leff, MD      . oxyCODONE-acetaminophen (PERCOCET/ROXICET) 5-325 MG per tablet 1-2 tablet  1-2 tablet Oral Q4H PRN Mariel Aloe, MD   2 tablet at 01/07/20 0002  . pantoprazole (PROTONIX) EC tablet 40 mg  40 mg Oral Daily Para Skeans, MD   40 mg at 01/06/20 1052  . polyethylene glycol (MIRALAX / GLYCOLAX) packet 17 g  17 g Oral Daily PRN Opyd, Ilene Qua, MD      . potassium chloride SA (KLOR-CON) CR tablet 40 mEq  40 mEq Oral BID Rosita Fire, MD   40 mEq at 01/06/20 2256  . sodium chloride flush (NS) 0.9 % injection 3 mL  3 mL Intravenous Q12H Opyd, Ilene Qua, MD   3 mL at 01/06/20 2256  . sodium chloride flush (NS) 0.9 % injection 3 mL  3 mL Intravenous Q12H Opyd, Ilene Qua, MD   3 mL at 01/06/20 1052  . sodium chloride flush (NS) 0.9 % injection 3 mL  3 mL Intravenous PRN Opyd, Ilene Qua, MD          Review of Systems: 10 systems reviewed and negative except per interval history/subjective  Physical Exam: Vitals:   01/06/20 1051 01/06/20 2038  BP: (!) 165/101 (!) 160/101  Pulse: 68 72  Resp: 18 19  Temp:  98.9 F (37.2 C)  SpO2: 96% 97%   No intake/output data recorded.  Intake/Output Summary (Last 24 hours) at 01/07/2020 7591 Last data filed at 01/07/2020 6384 Gross per 24 hour  Intake 486 ml  Output 1675 ml  Net -1189 ml   Constitutional: appears older than age, resting comfortably in bed, no acute distress ENMT: ears and nose without scars or lesions, MMM CV: normal rate, no JVD, significant peripheral edema 2+ to mid thigh Respiratory: clear to auscultation, normal work of breathing GI/GU: soft, non-tender, no palpable masses or hernias; suprapubic tenderness likely due to distended bladder Skin: no visible lesions or rashes Psych: somnolent but appropriately awakens and answers questions   Test Results I personally reviewed new and old clinical labs and radiology tests Lab Results  Component Value Date   NA 137  01/07/2020   K 3.2 (L) 01/07/2020   CL 103 01/07/2020   CO2 26 01/07/2020   BUN 18 01/07/2020   CREATININE 1.27 (H) 01/07/2020   CALCIUM 9.0 01/07/2020   ALBUMIN 2.8 (L) 01/07/2020   PHOS 4.2 01/07/2020

## 2020-01-07 NOTE — Progress Notes (Signed)
Physical Therapy Treatment Patient Details Name: [name] MRN: [id number] DOB: [birth year] Today's Date: 01/07/2020    History of Present Illness 59 y.o. male with medical history significant for hypertension, poorly controlled diabetes mellitus, chronic leukopenia, history of CVA, and history of substance abuse, now presenting to the emergency department for evaluation of aches. He has not taken any of his medicines in a couple days at least, and has developed cramping pain mainly in his legs. Pt found to be hypertensive and hypokalemic.    PT Comments    Pt was seen for mult options to try transfer skills.  Has fairly good lateral scoot skills, can clear hips a small amount attempting standing and is able to be assisted to mobilize in the bed with mod assist.  Pt was insistent that with crutches and other options could walk, but talked with him about the progress with scooting and trying to stand that indicated the walking would come along.  Pt is back to bed finally, and with pt and nursing recommended nursing use a maxi move to transfer him safely despite the pt wanting to reach over the footboard to try standing.  Will focus on sequencing and safety, up with lift until pt can stand safely.  Follow Up Recommendations  SNF     Equipment Recommendations  Hospital bed;Other (comment) (lift equipment)    Recommendations for Other Services       Precautions / Restrictions Precautions Precautions: Fall Precaution Comments: pt is unable to fully stand with elevation of bed height Restrictions Weight Bearing Restrictions: No    Mobility  Bed Mobility Overal bed mobility: Needs Assistance Bed Mobility: Supine to Sit;Sit to Supine     Supine to sit: Min assist Sit to supine: Mod assist      Transfers Overall transfer level: Needs assistance Equipment used: Rolling walker (2 wheeled) Transfers: Sit to/from [facility] Sit to Stand: Total assist Stand pivot  transfers: Total assist       General transfer comment: pt cannot stand with help and is inconsistent about pushing through his legs.  Has flexion limits of 70 deg on knees and hips.  Ambulation/Gait             General Gait Details: unable   Stairs             Wheelchair Mobility    Modified Rankin (Stroke Patients Only)       Balance Overall balance assessment: Needs assistance Sitting-balance support: Feet supported Sitting balance-Leahy Scale: Fair Sitting balance - Comments: fair after stretching to get his hips flexed enough to stay in sitting     Standing balance-Leahy Scale: Zero                              Cognition Arousal/Alertness: Awake/alert Behavior During Therapy: Impulsive;Anxious Overall Cognitive Status: No family/caregiver present to determine baseline cognitive functioning                                 General Comments: pt is insistent that despite being unable to stand that he can walk if PT would let him      Exercises      General Comments General comments (skin integrity, edema, etc.): pt was tried three ways to get up and finally got back to bed after working on lateral scooting and attempting to scoot up the  bed      Pertinent Vitals/Pain Pain Assessment: Faces Faces Pain Scale: Hurts a little bit Pain Location: bilateral knees and back Pain Descriptors / Indicators: Guarding Pain Intervention(s): Limited activity within patient's tolerance;Monitored during session;Premedicated before session;Repositioned    Home Living                      Prior Function            PT Goals (current goals can now be found in the care plan section) Acute Rehab PT Goals Patient Stated Goal: to start walking Progress towards PT goals: Progressing toward goals    Frequency    Min 2X/week      PT Plan Current plan remains appropriate    Co-evaluation              AM-PAC PT "6  Clicks" Mobility   Outcome Measure  Help needed turning from your back to your side while in a flat bed without using bedrails?: A Lot Help needed moving from lying on your back to sitting on the side of a flat bed without using bedrails?: A Lot Help needed moving to and from a bed to a chair (including a wheelchair)?: A Lot Help needed standing up from a chair using your arms (e.g., wheelchair or bedside chair)?: Total Help needed to walk in hospital room?: Total Help needed climbing 3-5 steps with a railing? : Total 6 Click Score: 9    End of Session Equipment Utilized During Treatment: Gait belt Activity Tolerance: Patient tolerated treatment well Patient left: in bed;with call bell/phone within reach;with bed alarm set;with nursing/sitter in room Nurse Communication: Mobility status;Need for lift equipment (recommend nursing use maxi move) PT Visit Diagnosis: Other abnormalities of gait and mobility (R26.89);Muscle weakness (generalized) (M62.81);Difficulty in walking, not elsewhere classified (R26.2)     Time: 3235-5732 PT Time Calculation (min) (ACUTE ONLY): 47 min  Charges:  $Therapeutic Activity: 23-37 mins $Neuromuscular Re-education: 8-22 mins                     Ramond Dial 01/07/2020, 5:15 PM  Mee Hives, PT MS Acute Rehab Dept. Number: [place] and [place]

## 2020-01-07 NOTE — Plan of Care (Signed)

## 2020-01-07 NOTE — Progress Notes (Signed)
PT Cancellation Note  Patient Details Name: [name] MRN: [id number] DOB: 01/05/1961   Cancelled Treatment:    Reason Eval/Treat Not Completed: Medical issues which prohibited therapy.  Has new chest pain that nursing is managing currently.  Will retry as time and pt allow, pending relief of symptoms today.   Ramond Dial 01/07/2020, 10:55 AM   Mee Hives, PT MS Acute Rehab Dept. Number: [place] and [place]

## 2020-01-07 NOTE — Plan of Care (Signed)

## 2020-01-07 NOTE — Progress Notes (Signed)
 PROGRESS NOTE    [name]  MRN:[id number] DOB: 06/21/1960 DOA: 12/31/2019 PCP: Edwards, Michelle P, NP   Brief Narrative: [name] is a 59 y.o. malewith medical history significant forhypertension, poorly controlled diabetes mellitus, chronic leukopenia, history of CVA, and history of substance abuse. Patient presented secondary to body aches with evidence of mild rhabdomyolysis and volume overload, found to have nephrotic syndrome and AKI. Started on IV diuresis.   Assessment & Plan:   Principal Problem:   Generalized body aches Active Problems:   Hypertension   Substance-related disorder (HCC)   Hypokalemia   Non-traumatic rhabdomyolysis   Grade I diastolic dysfunction   Anemia   Hypokalemia due to loss of potassium   Nephrotic syndrome   Anasarca   Nephrotic syndrome Concern this is secondary to patient's underlying diabetes mellitus. Nephrology consulted and managing. Urinalysis significant for large protein/glucose with a urine protein/creatinine of 3.51. Serum albumin of around 2. Albumin IV given. UOP of 1.675 L in the last 24 hours. Weight on admission of 127.7 kg. Weight of 135 kg yesterday -Nephrology recommendations: Lasix IV  Abdominal distension Significant. Associated abdominal pain. Patient states he is not passing gas. Multiple episodes of watery stool. Decreased bowel sounds. No obstruction or volvulus on CT abdomen (11/10). Possibly some colitis, but if so, mild. No leukocytosis. CT scan also with urinary distension -GI pathogen panel -ESR, CRP -Serial abdominal exams -Bladder scan  AKI on CKD stage III AKI secondary to hypertensive emergency/ACE inhibitor use. Baseline creatinine of about 1.1. Peak creatinine of 1.42. benazepril held. Back towards baseline.  Chronic diastolic heart dysfunction Stable.  Primary hypertension Hypertensive emergency on admission. Blood pressure better controlled now but still elevated at times. -Continue  atenolol and diltiazem  Anemia Normocytic. Anemia panel suggests possible mixed picture. Stable. No evidence of bleed. In setting of diarrhea as well as chronic kidney disease. -FOBT pending  Hypokalemia -Continue potassium supplementation -BMP daily  Diabetes mellitus, type 2 Uncontrolled with hyperglycemia and hypoglycemia Patient is on Lantus 40 units qhs and Januemet as an outpatient. -Decreased to Lantus 20 units daily and continue SSI  Bicuspid aortic valve Moderate aortic valve stenosis Associated LVH without evidence of LV systolic dysfunction. No LV dilation. -Cardiology follow-up outpatient  Dilated bladder Bladder scan on 11/11 was significant for 600mL+ of retained urine. Patient is declining in/out and foley catheters unless he has anesthesia put him to sleep. Risks discussed with patient. Discussed giving pain medication prior to insertion followed by urology consult for insertion if initial attempt failed, however patient continues to decline as he states eh made a vow to himself to never have a foley placed because of pain. -Continue to monitor UOP -Bladder scan prn   DVT prophylaxis: Lovenox Code Status:   Code Status: Full Code Family Communication: None at bedside Disposition Plan: Discharge to SNF pending nephrology recommendations in addition to improvement of diarrhea. Likely several days   Consultants:   Nephrology  Procedures:   TRANSTHORACIC ECHOCARDIOGRAM (01/01/2020) IMPRESSIONS    1. Left ventricular ejection fraction, by estimation, is 65 to 70%. The  left ventricle has normal function. The left ventricle has no regional  wall motion abnormalities. There is moderate left ventricular hypertrophy.  Hyperdense myocardium, consider  hypertensive cardiomyopathy versus infilrative cardiomyopathy. Left  ventricular diastolic parameters are consistent with Grade I diastolic  dysfunction (impaired relaxation).  2. Right ventricular systolic  function is normal. The right ventricular  size is normal. There is mildly elevated pulmonary artery systolic    pressure. The estimated right ventricular systolic pressure is 36.6 mmHg.  3. Left atrial size was mildly dilated.  4. A small pericardial effusion is present. The pericardial effusion is  circumferential.  5. The mitral valve is grossly normal. Mild mitral valve regurgitation.  6. The aortic valve is functionally bicuspid. There is moderate  calcification of the aortic valve. Aortic valve regurgitation is trivial.  Moderate aortic valve stenosis. Aortic valve mean gradient measures 13.0  mmHg. Aortic valve Vmax measures 2.37 m/s.  Dimentionless index 0.32.  7. The inferior vena cava is dilated in size with <50% respiratory  variability, suggesting right atrial pressure of 15 mmHg.   Antimicrobials:  None    Subjective: Abdominal pain is improved. Patient declines indwelling urinary catheter placement.  Objective: Vitals:   01/06/20 0334 01/06/20 1051 01/06/20 2038 01/07/20 0921  BP: (!) 144/87 (!) 165/101 (!) 160/101 (!) 147/102  Pulse: 75 68 72 66  Resp: _0 Temp: 97.8 F (36.6 C)  98.9 F (37.2 C) 97.7 F (36.5 C)  TempSrc: Oral  Oral Oral  SpO2: 100% 96% 97% 95%  Weight: 135 kg     Height:        Intake/Output Summary (Last 24 hours) at 01/07/2020 1444 Last data filed at 01/07/2020 1211 Gross per 24 hour  Intake 486 ml  Output 1675 ml  Net -1189 ml   Filed Weights   01/04/20 0500 01/05/20 0511 01/06/20 0334  Weight: 135.1 kg (!) 136.8 kg 135 kg    Examination:  General exam: Appears calm and comfortable Respiratory system: Clear to auscultation. Respiratory effort normal. Cardiovascular system: S1 & S2 heard, RRR. No murmurs, rubs, gallops or clicks. Gastrointestinal system: Abdomen is significantly distended, soft and nontender. Decreased bowel sounds heard. Central nervous system: Alert and oriented. No focal neurological  deficits. Musculoskeletal: 2+ BLE edema. No calf tenderness Skin: No cyanosis. No rashes Psychiatry: Judgement and insight appear normal. Mood & affect appropriate.     Data Reviewed: I have personally reviewed following labs and imaging studies  CBC Lab Results  Component Value Date   WBC 3.0 (L) 01/06/2020   RBC 3.58 (L) 01/06/2020   HGB 9.2 (L) 01/06/2020   HCT 29.3 (L) 01/06/2020   MCV 81.8 01/06/2020   MCH 25.7 (L) 01/06/2020   PLT 276 01/06/2020   MCHC 31.4 01/06/2020   RDW 15.0 01/06/2020   LYMPHSABS 1.2 01/04/2020   MONOABS 0.5 01/04/2020   EOSABS 0.2 01/04/2020   BASOSABS 0.0 44/04/4740     Last metabolic panel Lab Results  Component Value Date   NA 137 01/07/2020   K 3.2 (L) 01/07/2020   CL 103 01/07/2020   CO2 26 01/07/2020   BUN 18 01/07/2020   CREATININE 1.27 (H) 01/07/2020   GLUCOSE 113 (H) 01/07/2020   GFRNONAA >60 01/07/2020   GFRAA >60 01/29/2018   CALCIUM 9.0 01/07/2020   PHOS 4.2 01/07/2020   PROT 5.5 (L) 01/05/2020   ALBUMIN 2.8 (L) 01/07/2020   BILITOT 0.5 01/05/2020   ALKPHOS 74 01/05/2020   AST 19 01/05/2020   ALT 13 01/05/2020   ANIONGAP 8 01/07/2020    CBG (last 3)  Recent Labs    01/06/20 2123 01/07/20 0851 01/07/20 1107  GLUCAP 93 116* 107*     GFR: Estimated Creatinine Clearance: 85.2 mL/min (A) (by C-G formula based on SCr of 1.27 mg/dL (H)).  Coagulation Profile: No results for input(s): INR, PROTIME in the last 168 hours.  Recent Results (from  the past 240 hour(s))  Respiratory Panel by RT PCR (Flu A&B, Covid) - Nasopharyngeal Swab     Status: None   Collection Time: 12/31/19  2:00 PM   Specimen: Nasopharyngeal Swab  Result Value Ref Range Status   SARS Coronavirus 2 by RT PCR NEGATIVE NEGATIVE Final    Comment: (NOTE) SARS-CoV-2 target nucleic acids are NOT DETECTED.  The SARS-CoV-2 RNA is generally detectable in upper respiratoy specimens during the acute phase of infection. The lowest concentration of  SARS-CoV-2 viral copies this assay can detect is 131 copies/mL. A negative result does not preclude SARS-Cov-2 infection and should not be used as the sole basis for treatment or other patient management decisions. A negative result may occur with  improper specimen collection/handling, submission of specimen other than nasopharyngeal swab, presence of viral mutation(s) within the areas targeted by this assay, and inadequate number of viral copies (<131 copies/mL). A negative result must be combined with clinical observations, patient history, and epidemiological information. The expected result is Negative.  Fact Sheet for Patients:  PinkCheek.be  Fact Sheet for Healthcare Providers:  GravelBags.it  This test is no t yet approved or cleared by the [place] FDA and  has been authorized for detection and/or diagnosis of SARS-CoV-2 by FDA under an Emergency Use Authorization (EUA). This EUA will remain  in effect (meaning this test can be used) for the duration of the COVID-19 declaration under Section 564(b)(1) of the Act, 21 U.S.C. section 360bbb-3(b)(1), unless the authorization is terminated or revoked sooner.     Influenza A by PCR NEGATIVE NEGATIVE Final   Influenza B by PCR NEGATIVE NEGATIVE Final    Comment: (NOTE) The Xpert Xpress SARS-CoV-2/FLU/RSV assay is intended as an aid in  the diagnosis of influenza from Nasopharyngeal swab specimens and  should not be used as a sole basis for treatment. Nasal washings and  aspirates are unacceptable for Xpert Xpress SARS-CoV-2/FLU/RSV  testing.  Fact Sheet for Patients: PinkCheek.be  Fact Sheet for Healthcare Providers: GravelBags.it  This test is not yet approved or cleared by the [place] FDA and  has been authorized for detection and/or diagnosis of SARS-CoV-2 by  FDA under an Emergency Use  Authorization (EUA). This EUA will remain  in effect (meaning this test can be used) for the duration of the  Covid-19 declaration under Section 564(b)(1) of the Act, 21  U.S.C. section 360bbb-3(b)(1), unless the authorization is  terminated or revoked. Performed at [facility], [place] [address] [place], [place] [address]   SARS Coronavirus 2 by RT PCR (hospital order, performed in [facility] hospital lab) Nasopharyngeal Nasopharyngeal Swab     Status: None   Collection Time: 01/03/20  5:09 PM   Specimen: Nasopharyngeal Swab  Result Value Ref Range Status   SARS Coronavirus 2 NEGATIVE NEGATIVE Final    Comment: (NOTE) SARS-CoV-2 target nucleic acids are NOT DETECTED.  The SARS-CoV-2 RNA is generally detectable in upper and lower respiratory specimens during the acute phase of infection. The lowest concentration of SARS-CoV-2 viral copies this assay can detect is 250 copies / mL. A negative result does not preclude SARS-CoV-2 infection and should not be used as the sole basis for treatment or other patient management decisions.  A negative result may occur with improper specimen collection / handling, submission of specimen other than nasopharyngeal swab, presence of viral mutation(s) within the areas targeted by this assay, and inadequate number of viral copies (<250 copies / mL). A negative result must  be combined with clinical observations, patient history, and epidemiological information.  Fact Sheet for Patients:   StrictlyIdeas.no  Fact Sheet for Healthcare Providers: BankingDealers.co.za  This test is not yet approved or  cleared by the [place] FDA and has been authorized for detection and/or diagnosis of SARS-CoV-2 by FDA under an Emergency Use Authorization (EUA).  This EUA will remain in effect (meaning this test can be used) for the duration of the COVID-19 declaration under Section 564(b)(1) of the Act, 21  U.S.C. section 360bbb-3(b)(1), unless the authorization is terminated or revoked sooner.  Performed at [facility], [place] [address], [place], [place] [address]   Gastrointestinal Panel by PCR , Stool     Status: None   Collection Time: 01/06/20 12:26 PM   Specimen: Stool  Result Value Ref Range Status   Campylobacter species NOT DETECTED NOT DETECTED Final   Plesimonas shigelloides NOT DETECTED NOT DETECTED Final   Salmonella species NOT DETECTED NOT DETECTED Final   Yersinia enterocolitica NOT DETECTED NOT DETECTED Final   Vibrio species NOT DETECTED NOT DETECTED Final   Vibrio cholerae NOT DETECTED NOT DETECTED Final   Enteroaggregative E coli (EAEC) NOT DETECTED NOT DETECTED Final   Enteropathogenic E coli (EPEC) NOT DETECTED NOT DETECTED Final   Enterotoxigenic E coli (ETEC) NOT DETECTED NOT DETECTED Final   Shiga like toxin producing E coli (STEC) NOT DETECTED NOT DETECTED Final   Shigella/Enteroinvasive E coli (EIEC) NOT DETECTED NOT DETECTED Final   Cryptosporidium NOT DETECTED NOT DETECTED Final   Cyclospora cayetanensis NOT DETECTED NOT DETECTED Final   Entamoeba histolytica NOT DETECTED NOT DETECTED Final   Giardia lamblia NOT DETECTED NOT DETECTED Final   Adenovirus F40/41 NOT DETECTED NOT DETECTED Final   Astrovirus NOT DETECTED NOT DETECTED Final   Norovirus GI/GII NOT DETECTED NOT DETECTED Final   Rotavirus A NOT DETECTED NOT DETECTED Final   Sapovirus (I, II, IV, and V) NOT DETECTED NOT DETECTED Final    Comment: Performed at [facility], [address] [place], [place] [address]        Radiology Studies: CT ABDOMEN PELVIS W CONTRAST  Result Date: 01/06/2020 CLINICAL DATA:  59 year male with abdominal distension. Concern for bowel obstruction. EXAM: CT ABDOMEN AND PELVIS WITH CONTRAST TECHNIQUE: Multidetector CT imaging of the abdomen and pelvis was performed using the standard protocol following bolus administration of intravenous  contrast. CONTRAST:  150m OMNIPAQUE IOHEXOL 300 MG/ML  SOLN COMPARISON:  CT of the chest abdomen pelvis dated 09/28/2017. FINDINGS: Evaluation of this exam is limited due to respiratory motion artifact. Lower chest: Partially visualized small pericardial effusion. There is mild dilatation of the visualized pulmonary vasculature, likely mild congestion. No intra-abdominal free air or free fluid. Hepatobiliary: Probable mild fatty liver. No intrahepatic biliary duct dilatation. There is a 15 mm stone in the neck of the gallbladder. No pericholecystic fluid or evidence of acute cholecystitis by CT. Ultrasound may provide better evaluation if there is high clinical concern for acute cholecystitis. Pancreas: There is moderate atrophy of the pancreas. No active inflammatory changes. No dilatation of the main pancreatic duct. Spleen: Normal in size without focal abnormality. Adrenals/Urinary Tract: The adrenal glands unremarkable. There is no hydronephrosis on either side. There is symmetric enhancement and excretion of contrast by both kidneys. The visualized ureters and urinary bladder appear unremarkable. Stomach/Bowel: There is air distended colon without evidence of obstruction or twisting. There is segmental thickening of the distal transverse colon (14/3) which may be related  to underdistention. Mild focal colitis is less likely. Clinical correlation is recommended. There is apparent soft tissue thickening with possible shouldering within the lumen of this segment of the colon concerning for a mass. Further evaluation with colonoscopy after resolution of acute symptoms recommended. There is no bowel obstruction. The appendix is normal. Vascular/Lymphatic: The abdominal aorta and IVC are unremarkable. No portal venous gas. There is no adenopathy. Reproductive: The prostate and seminal vesicles are grossly unremarkable as visualized. Other: There is diffuse subcutaneous edema and anasarca. Musculoskeletal:  Degenerative changes of spine. No acute osseous pathology. IMPRESSION: 1. Air distended colon without evidence of obstruction or twisting. Normal appendix. 2. Apparent soft tissue thickening of the distal transverse colon concerning for a mass. Further evaluation with colonoscopy after resolution of acute symptoms recommended. 3. Cholelithiasis. 4. Small pericardial effusion and findings of mild pulmonary congestion. 5. Diffuse subcutaneous edema and anasarca. Electronically Signed   By: Arash  Radparvar M.D.   On: 01/06/2020 00:04   DG Abd Portable 1V  Result Date: 01/05/2020 CLINICAL DATA:  59 year old male with abdominal distension. EXAM: PORTABLE ABDOMEN - 1 VIEW COMPARISON:  CT Abdomen and Pelvis 09/28/2017. FINDINGS: Portable AP supine view at 1529 hours. Abnormally gas distended sigmoid colon projecting in the central lower abdomen and upper pelvis is dilated to 15 mm, with upstream gas dilatation of the large bowel. The configuration is suspicious for developing sigmoid volvulus. Superimposed retained stool in the large bowel. No dilated small bowel loops. No pneumoperitoneum is evident on this supine view. No acute osseous abnormality identified. IMPRESSION: Dilated sigmoid colon with upstream gas-filled large bowel suspicious for Sigmoid Volvulus. CT Abdomen and Pelvis (oral and IV contrast preferred) should be confirmatory. These results will be called to the ordering clinician or representative by the Radiologist Assistant, and communication documented in the PACS or [facility] Dashboard. Electronically Signed   By: H  Hall M.D.   On: 01/05/2020 17:49        Scheduled Meds: . atenolol  25 mg Oral Daily  . diltiazem  180 mg Oral Daily  . enoxaparin (LOVENOX) injection  65 mg Subcutaneous Daily  . furosemide  80 mg Intravenous Q8H  . gabapentin  800 mg Oral BID  . insulin aspart  0-5 Units Subcutaneous QHS  . insulin aspart  0-9 Units Subcutaneous TID WC  . insulin glargine  20 Units  Subcutaneous BID  . metolazone  5 mg Oral Once  . pantoprazole  40 mg Oral Daily  . potassium chloride  40 mEq Oral TID  . sodium chloride flush  3 mL Intravenous Q12H  . sodium chloride flush  3 mL Intravenous Q12H   Continuous Infusions: . sodium chloride       LOS: 3 days      , MD [facility] 01/07/2020, 2:44 PM  If 7PM-7AM, please contact night-coverage [email or web address] 

## 2020-01-07 NOTE — Progress Notes (Signed)
At [place] pt reported 8/10 left mid chest pain that radiated to the left shoulder to the RN. Pt stated that "The pain shoots from my chest to my shoulder, it is a shooting aching pain". Pt vitals were taken, all were stable. The BP was elevated, but it was not out of pt normal range. At 0921 one nitroglycerin tablet was administered. CP was still not relieved and remained the same at 8/10. Another sublingual tablet was administered at 0926. At [phone] pt stated "I still have chest pain, but I do not want another nitroglycerin because it is giving me a headache". I educated the pt on the side effects of nitroglycerin, and made the pt aware that this is common. The pt still refused the third dose of nitroglycerin, regardless of educated provided and consistent pain. MD was notified at [phone]. An EKG was performed by Estill Bamberg, NT. EKG placed on pt chart and transferred to results. Pt states at 1005 that they are now CP free. MD notified of this as well. Will continue to monitor.

## 2020-01-08 DIAGNOSIS — R52 Pain, unspecified: Secondary | ICD-10-CM | POA: Diagnosis not present

## 2020-01-08 DIAGNOSIS — I10 Essential (primary) hypertension: Secondary | ICD-10-CM | POA: Diagnosis not present

## 2020-01-08 DIAGNOSIS — I5189 Other ill-defined heart diseases: Secondary | ICD-10-CM | POA: Diagnosis not present

## 2020-01-08 DIAGNOSIS — R140 Abdominal distension (gaseous): Secondary | ICD-10-CM | POA: Diagnosis not present

## 2020-01-08 LAB — GLUCOSE, CAPILLARY
Glucose-Capillary: 102 mg/dL — ABNORMAL HIGH (ref 70–99)
Glucose-Capillary: 124 mg/dL — ABNORMAL HIGH (ref 70–99)
Glucose-Capillary: 106 mg/dL — ABNORMAL HIGH (ref 70–99)
Glucose-Capillary: 104 mg/dL — ABNORMAL HIGH (ref 70–99)

## 2020-01-08 LAB — RENAL FUNCTION PANEL
Albumin: 2.7 g/dL — ABNORMAL LOW (ref 3.5–5.0)
Anion gap: 11 (ref 5–15)
BUN: 22 mg/dL — ABNORMAL HIGH (ref 6–20)
CO2: 21 mmol/L — ABNORMAL LOW (ref 22–32)
Calcium: 8.9 mg/dL (ref 8.9–10.3)
Chloride: 104 mmol/L (ref 98–111)
Creatinine, Ser: 1.56 mg/dL — ABNORMAL HIGH (ref 0.61–1.24)
GFR, Estimated: 51 mL/min — ABNORMAL LOW (ref >60)
Glucose, Bld: 96 mg/dL (ref 70–99)
Phosphorus: 4.9 mg/dL — ABNORMAL HIGH (ref 2.5–4.6)
Potassium: 3.8 mmol/L (ref 3.5–5.1)
Sodium: 136 mmol/L (ref 135–145)

## 2020-01-08 LAB — TROPONIN I (HIGH SENSITIVITY)
Troponin I (High Sensitivity): 15 ng/L (ref <18)
Troponin I (High Sensitivity): 13 ng/L (ref <18)

## 2020-01-08 LAB — MAGNESIUM: Magnesium: 1.9 mg/dL (ref 1.7–2.4)

## 2020-01-08 MED ORDER — ONDANSETRON HCL 4 MG/2ML IJ SOLN
4.0000 mg | Freq: Four times a day (QID) | INTRAMUSCULAR | Status: DC | PRN
  Administered 2020-01-09 – 2020-01-28 (×3): 4 mg via INTRAVENOUS
  Filled 2020-01-08 (×3): qty 2

## 2020-01-08 MED ORDER — ONDANSETRON HCL 4 MG PO TABS
4.0000 mg | ORAL_TABLET | Freq: Four times a day (QID) | ORAL | Status: DC | PRN
  Administered 2020-01-08: 4 mg via ORAL
  Filled 2020-01-08: qty 1

## 2020-01-08 NOTE — Progress Notes (Signed)
PROGRESS NOTE    [name]  NUU:[id number] DOB: Aug 04, 1960 DOA: 12/31/2019 PCP: [name], NP   Brief Narrative: [name] is a 59 y.o. malewith medical history significant forhypertension, poorly controlled diabetes mellitus, chronic leukopenia, history of CVA, and history of substance abuse. Patient presented secondary to body aches with evidence of mild rhabdomyolysis and volume overload, found to have nephrotic syndrome and AKI. Started on IV diuresis.   Assessment & Plan:   Principal Problem:   Generalized body aches Active Problems:   Hypertension   Substance-related disorder (HCC)   Hypokalemia   Non-traumatic rhabdomyolysis   Grade I diastolic dysfunction   Anemia   Hypokalemia due to loss of potassium   Nephrotic syndrome   Anasarca   Nephrotic syndrome Concern this is secondary to patient's underlying diabetes mellitus. Nephrology consulted and managing. Urinalysis significant for large protein/glucose with a urine protein/creatinine of 3.51. Serum albumin of around 2. Albumin IV given. UOP of 1.1.75 L in the last 24 hours. Weight on admission of 127.7 kg. Weight of 134.2 kg today -Nephrology recommendations: Lasix IV  Abdominal distension Significant. Associated abdominal pain. Patient states he is not passing gas. Multiple episodes of watery stool. Decreased bowel sounds. No obstruction or volvulus on CT abdomen (11/10). Possibly some colitis, but if so, mild. No leukocytosis. CT scan also with urinary distension. GI pathogen panel negative. -Serial abdominal exams  AKI on CKD stage III AKI secondary to hypertensive emergency/ACE inhibitor use. Baseline creatinine of about 1.1. Peak creatinine of 1.42. benazepril held. Back towards baseline.  Chronic diastolic heart dysfunction Stable.  Primary hypertension Hypertensive emergency on admission. Blood pressure better controlled now but still elevated at times. -Continue atenolol and  diltiazem  Anemia Normocytic. Anemia panel suggests possible mixed picture. Stable. No evidence of bleed. In setting of diarrhea as well as chronic kidney disease. -FOBT ordered but still pending  Hypokalemia -Continue potassium supplementation -BMP daily  Diabetes mellitus, type 2 Uncontrolled with hyperglycemia and hypoglycemia Patient is on Lantus 40 units qhs and Januemet as an outpatient. -Continue Lntus 20 units daily and continue SSI  Bicuspid aortic valve Moderate aortic valve stenosis Associated LVH without evidence of LV systolic dysfunction. No LV dilation. -Cardiology follow-up outpatient  Urinary retention Bladder scan on 11/11 was significant for 624mL+ of retained urine. Patient continues to retain urine. Patient continues to decline in/out catheterization and foley catheters unless he has anesthesia put him to sleep. Risks discussed with patient. Discussed giving pain medication prior to insertion followed by urology consult for insertion if initial attempt failed, however patient continues to decline as he states eh made a vow to himself to never have a foley placed because of pain. -Continue to monitor UOP -Bladder scan prn  Chest pain Likely reflux related -Continue Protonix   DVT prophylaxis: Lovenox Code Status:   Code Status: Full Code Family Communication: None at bedside Disposition Plan: Discharge to SNF pending nephrology recommendations in addition to improvement of diarrhea. Likely several days   Consultants:   Nephrology  Procedures:   TRANSTHORACIC ECHOCARDIOGRAM (01/01/2020) IMPRESSIONS    1. Left ventricular ejection fraction, by estimation, is 65 to 70%. The  left ventricle has normal function. The left ventricle has no regional  wall motion abnormalities. There is moderate left ventricular hypertrophy.  Hyperdense myocardium, consider  hypertensive cardiomyopathy versus infilrative cardiomyopathy. Left  ventricular diastolic  parameters are consistent with Grade I diastolic  dysfunction (impaired relaxation).  2. Right ventricular systolic function is normal.  The right ventricular  size is normal. There is mildly elevated pulmonary artery systolic  pressure. The estimated right ventricular systolic pressure is 57.8 mmHg.  3. Left atrial size was mildly dilated.  4. A small pericardial effusion is present. The pericardial effusion is  circumferential.  5. The mitral valve is grossly normal. Mild mitral valve regurgitation.  6. The aortic valve is functionally bicuspid. There is moderate  calcification of the aortic valve. Aortic valve regurgitation is trivial.  Moderate aortic valve stenosis. Aortic valve mean gradient measures 13.0  mmHg. Aortic valve Vmax measures 2.37 m/s.  Dimentionless index 0.32.  7. The inferior vena cava is dilated in size with <50% respiratory  variability, suggesting right atrial pressure of 15 mmHg.   Antimicrobials:  None    Subjective: Patient reports chest pain this morning that is sharp, spontaneous. Resolved spontaneously as well. No associated dyspnea.   Objective: Vitals:   01/08/20 0500 01/08/20 0716 01/08/20 1036 01/08/20 1134  BP:  (!) 167/119 (!) 144/103 (!) 139/95  Pulse:   68   Resp:      Temp:      TempSrc:      SpO2:      Weight: 134.2 kg     Height:        Intake/Output Summary (Last 24 hours) at 01/08/2020 1318 Last data filed at 01/08/2020 0541 Gross per 24 hour  Intake 259 ml  Output 1751 ml  Net -1492 ml   Filed Weights   01/05/20 0511 01/06/20 0334 01/08/20 0500  Weight: (!) 136.8 kg 135 kg 134.2 kg    Examination:  General exam: Appears calm and comfortable Respiratory system: Clear to auscultation. Respiratory effort normal. Cardiovascular system: S1 & S2 heard, RRR. No murmurs, rubs, gallops or clicks. Gastrointestinal system: Abdomen is significantly distended, soft and nontender. Decreased bowel sounds heard. Central  nervous system: Alert and oriented. No focal neurological deficits. Musculoskeletal: 2+ BLE edema. No calf tenderness Skin: No cyanosis. No rashes Psychiatry: Judgement and insight appear normal. Mood & affect appropriate.     Data Reviewed: I have personally reviewed following labs and imaging studies  CBC Lab Results  Component Value Date   WBC 3.0 (L) 01/06/2020   RBC 3.58 (L) 01/06/2020   HGB 9.2 (L) 01/06/2020   HCT 29.3 (L) 01/06/2020   MCV 81.8 01/06/2020   MCH 25.7 (L) 01/06/2020   PLT 276 01/06/2020   MCHC 31.4 01/06/2020   RDW 15.0 01/06/2020   LYMPHSABS 1.2 01/04/2020   MONOABS 0.5 01/04/2020   EOSABS 0.2 01/04/2020   BASOSABS 0.0 46/96/2952     Last metabolic panel Lab Results  Component Value Date   NA 136 01/08/2020   K 3.8 01/08/2020   CL 104 01/08/2020   CO2 21 (L) 01/08/2020   BUN 22 (H) 01/08/2020   CREATININE 1.56 (H) 01/08/2020   GLUCOSE 96 01/08/2020   GFRNONAA 51 (L) 01/08/2020   GFRAA >60 01/29/2018   CALCIUM 8.9 01/08/2020   PHOS 4.9 (H) 01/08/2020   PROT 5.5 (L) 01/05/2020   ALBUMIN 2.7 (L) 01/08/2020   BILITOT 0.5 01/05/2020   ALKPHOS 74 01/05/2020   AST 19 01/05/2020   ALT 13 01/05/2020   ANIONGAP 11 01/08/2020    CBG (last 3)  Recent Labs    01/07/20 2139 01/08/20 0753 01/08/20 1133  GLUCAP 93 102* 124*     GFR: Estimated Creatinine Clearance: 69.1 mL/min (A) (by C-G formula based on SCr of 1.56 mg/dL (H)).  Coagulation Profile:  No results for input(s): INR, PROTIME in the last 168 hours.  Recent Results (from the past 240 hour(s))  Respiratory Panel by RT PCR (Flu A&B, Covid) - Nasopharyngeal Swab     Status: None   Collection Time: 12/31/19  2:00 PM   Specimen: Nasopharyngeal Swab  Result Value Ref Range Status   SARS Coronavirus 2 by RT PCR NEGATIVE NEGATIVE Final    Comment: (NOTE) SARS-CoV-2 target nucleic acids are NOT DETECTED.  The SARS-CoV-2 RNA is generally detectable in upper respiratoy specimens during  the acute phase of infection. The lowest concentration of SARS-CoV-2 viral copies this assay can detect is 131 copies/mL. A negative result does not preclude SARS-Cov-2 infection and should not be used as the sole basis for treatment or other patient management decisions. A negative result may occur with  improper specimen collection/handling, submission of specimen other than nasopharyngeal swab, presence of viral mutation(s) within the areas targeted by this assay, and inadequate number of viral copies (<131 copies/mL). A negative result must be combined with clinical observations, patient history, and epidemiological information. The expected result is Negative.  Fact Sheet for Patients:  PinkCheek.be  Fact Sheet for Healthcare Providers:  GravelBags.it  This test is no t yet approved or cleared by the [place] FDA and  has been authorized for detection and/or diagnosis of SARS-CoV-2 by FDA under an Emergency Use Authorization (EUA). This EUA will remain  in effect (meaning this test can be used) for the duration of the COVID-19 declaration under Section 564(b)(1) of the Act, 21 U.S.C. section 360bbb-3(b)(1), unless the authorization is terminated or revoked sooner.     Influenza A by PCR NEGATIVE NEGATIVE Final   Influenza B by PCR NEGATIVE NEGATIVE Final    Comment: (NOTE) The Xpert Xpress SARS-CoV-2/FLU/RSV assay is intended as an aid in  the diagnosis of influenza from Nasopharyngeal swab specimens and  should not be used as a sole basis for treatment. Nasal washings and  aspirates are unacceptable for Xpert Xpress SARS-CoV-2/FLU/RSV  testing.  Fact Sheet for Patients: PinkCheek.be  Fact Sheet for Healthcare Providers: GravelBags.it  This test is not yet approved or cleared by the [place] FDA and  has been authorized for detection and/or  diagnosis of SARS-CoV-2 by  FDA under an Emergency Use Authorization (EUA). This EUA will remain  in effect (meaning this test can be used) for the duration of the  Covid-19 declaration under Section 564(b)(1) of the Act, 21  U.S.C. section 360bbb-3(b)(1), unless the authorization is  terminated or revoked. Performed at [facility], [place] [address] [place], [place] [address]   SARS Coronavirus 2 by RT PCR (hospital order, performed in [facility] hospital lab) Nasopharyngeal Nasopharyngeal Swab     Status: None   Collection Time: 01/03/20  5:09 PM   Specimen: Nasopharyngeal Swab  Result Value Ref Range Status   SARS Coronavirus 2 NEGATIVE NEGATIVE Final    Comment: (NOTE) SARS-CoV-2 target nucleic acids are NOT DETECTED.  The SARS-CoV-2 RNA is generally detectable in upper and lower respiratory specimens during the acute phase of infection. The lowest concentration of SARS-CoV-2 viral copies this assay can detect is 250 copies / mL. A negative result does not preclude SARS-CoV-2 infection and should not be used as the sole basis for treatment or other patient management decisions.  A negative result may occur with improper specimen collection / handling, submission of specimen other than nasopharyngeal swab, presence of viral mutation(s) within the areas targeted by this  assay, and inadequate number of viral copies (<250 copies / mL). A negative result must be combined with clinical observations, patient history, and epidemiological information.  Fact Sheet for Patients:   StrictlyIdeas.no  Fact Sheet for Healthcare Providers: BankingDealers.co.za  This test is not yet approved or  cleared by the [place] FDA and has been authorized for detection and/or diagnosis of SARS-CoV-2 by FDA under an Emergency Use Authorization (EUA).  This EUA will remain in effect (meaning this test can be used) for the duration of  the COVID-19 declaration under Section 564(b)(1) of the Act, 21 U.S.C. section 360bbb-3(b)(1), unless the authorization is terminated or revoked sooner.  Performed at [facility], [place] [address], [place], [place] [address]   Gastrointestinal Panel by PCR , Stool     Status: None   Collection Time: 01/06/20 12:26 PM   Specimen: Stool  Result Value Ref Range Status   Campylobacter species NOT DETECTED NOT DETECTED Final   Plesimonas shigelloides NOT DETECTED NOT DETECTED Final   Salmonella species NOT DETECTED NOT DETECTED Final   Yersinia enterocolitica NOT DETECTED NOT DETECTED Final   Vibrio species NOT DETECTED NOT DETECTED Final   Vibrio cholerae NOT DETECTED NOT DETECTED Final   Enteroaggregative E coli (EAEC) NOT DETECTED NOT DETECTED Final   Enteropathogenic E coli (EPEC) NOT DETECTED NOT DETECTED Final   Enterotoxigenic E coli (ETEC) NOT DETECTED NOT DETECTED Final   Shiga like toxin producing E coli (STEC) NOT DETECTED NOT DETECTED Final   Shigella/Enteroinvasive E coli (EIEC) NOT DETECTED NOT DETECTED Final   Cryptosporidium NOT DETECTED NOT DETECTED Final   Cyclospora cayetanensis NOT DETECTED NOT DETECTED Final   Entamoeba histolytica NOT DETECTED NOT DETECTED Final   Giardia lamblia NOT DETECTED NOT DETECTED Final   Adenovirus F40/41 NOT DETECTED NOT DETECTED Final   Astrovirus NOT DETECTED NOT DETECTED Final   Norovirus GI/GII NOT DETECTED NOT DETECTED Final   Rotavirus A NOT DETECTED NOT DETECTED Final   Sapovirus (I, II, IV, and V) NOT DETECTED NOT DETECTED Final    Comment: Performed at [facility], [address], [place],  [address]        Radiology Studies: No results found.      Scheduled Meds: . atenolol  25 mg Oral Daily  . diltiazem  180 mg Oral Daily  . enoxaparin (LOVENOX) injection  65 mg Subcutaneous Daily  . furosemide  80 mg Intravenous Q8H  . gabapentin  800 mg Oral BID  . insulin aspart  0-5 Units Subcutaneous  QHS  . insulin aspart  0-9 Units Subcutaneous TID WC  . insulin glargine  20 Units Subcutaneous Daily  . pantoprazole  40 mg Oral Daily  . potassium chloride  40 mEq Oral TID  . sodium chloride flush  3 mL Intravenous Q12H  . sodium chloride flush  3 mL Intravenous Q12H   Continuous Infusions: . sodium chloride       LOS: 4 days     Cordelia Poche, MD [facility] 01/08/2020, 1:18 PM  If 7PM-7AM, please contact night-coverage [email or web address]

## 2020-01-08 NOTE — Progress Notes (Signed)
During shift change and bedside report patient complaining of 10/10 CP.  Patient assisted to lying position in bed.  4L O2NC applied.  BP= 167/119.  PRN po hydralazine given and 2 percocet.  Dr. Lonny Prude paged via [place].  EKG obtained. Dr. Lonny Prude assessed EKG and ordered Troponins.  Patient removed O2 [place].

## 2020-01-08 NOTE — Progress Notes (Signed)
Bladder Scan performed by Tabitha, NT.  628cc in bladder.   Patient refused I&O cath and./or foley.  Dr Lonny Prude aware.

## 2020-01-08 NOTE — Progress Notes (Signed)
[name]  Assessment/ Plan: Pt is a 59 y.o. yo male  with history of hypertension, poorly controlled diabetes since 1989 with neuropathy, retinopathy A1c 10.6, stroke, CHF who was admitted on 11/5 for generalized body pain, hypertensive emergency, hypokalemia seen as a consultation for the evaluation of proteinuria, elevated creatinine level and lower extremity edema. The baseline creatinine level was around 1.1. He is not sure about his medication but denies using any diuretics prior to this hospitalization. Per chart, home medication includes atenolol, benazepril, diltiazem, Lasix, ibuprofen as needed.  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 Urinalysis has glucosuria and proteinuria without hematuria.  Hep B, hep C, HIV, ANA and kappa lambda ratio unremarkable.  Kidney ultrasound with normal cortical thickness and without obstruction. On IV Lasix with urine output not as expected.  Received IV albumin and a dose of metolazone.  Noted creatinine level trended up today however he still has significant lower extremity pitting edema.  I will continue IV Lasix today and possibly switch him to torsemide oral in next few days.   I will continue to hold ACE inhibitor or ARB while diuresing however this needs to be started after maintaining euvolemia. He will also benefit from [place] as outpatient. Recommend better glycemic control with goal A1c less than 7.  #Acute kidney injury due to hypertensive emergency and hemodynamic change related with ACE inhibitor. 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 Management as above. Creatinine level mildly up today however needing diuretics for lower extremity edema. Avoid nephrotoxins including NSAIDs or IV contrast.  #Bilateral lower extremity edema: diuretics as above.  #Hypokalemia due to diuretics: Continue potassium chloride  repletion. Magnesium level acceptable. Monitor electrolytes especially with diuretics.   #Hypertensive urgency during admission: Unsure if patient was taking his medication. Currently on atenolol and diltiazem. IV Lasix as above. Monitor blood pressure.  #Somnolent, recommend to avoid narcotics/sedatives.  Subjective: Seen and examined.  Urine output 1.7 L.  Reports generalized body pain, nausea and overall not feeling well. Objective Vital signs in last 24 hours: Vitals:   01/08/20 0000 01/08/20 0200 01/08/20 0500 01/08/20 0716  BP: (!) 142/69 140/70  (!) 167/119  Pulse: 70 68    Resp: 20 20    Temp: 97.7 F (36.5 C) 97.8 F (36.6 C)    TempSrc: Oral Oral    SpO2: 98% 97%    Weight:   134.2 kg   Height:       Weight change:   Intake/Output Summary (Last 24 hours) at 01/08/2020 1035 Last data filed at 01/08/2020 0541 Gross per 24 hour  Intake 262 ml  Output 1751 ml  Net -1489 ml       Labs: Basic Metabolic Panel: Recent Labs  Lab 01/06/20 0356 01/07/20 0503 01/08/20 0208  NA 135 137 136  K 3.3* 3.2* 3.8  CL 103 103 104  CO2 24 26 21*  GLUCOSE 84 113* 96  BUN 17 18 22*  CREATININE 1.17 1.27* 1.56*  CALCIUM 8.8* 9.0 8.9  PHOS 3.7 4.2 4.9*   Liver Function Tests: Recent Labs  Lab 01/03/20 0357 01/03/20 0357 01/04/20 0346 01/04/20 0346 01/05/20 0230 01/05/20 0230 01/06/20 0356 01/07/20 0503 01/08/20 0208  AST 22  --  21  --  19  --   --   --   --   ALT 14  --  14  --  13  --   --   --   --  ALKPHOS 73  --  72  --  74  --   --   --   --   BILITOT 0.4  --  0.3  --  0.5  --   --   --   --   PROT 5.0*  --  5.4*  --  5.5*  --   --   --   --   ALBUMIN 1.8*   < > 1.8*   < > 2.1*   < > 2.8* 2.8* 2.7*   < > = values in this interval not displayed.   No results for input(s): LIPASE, AMYLASE in the last 168 hours. No results for input(s): AMMONIA in the last 168 hours. CBC: Recent Labs  Lab 01/02/20 0750 01/02/20 0750 01/03/20 0357  01/04/20 0346 01/06/20 0928  WBC 3.5*   < > 3.5* 3.4* 3.0*  NEUTROABS 1.3*  --  1.5* 1.5*  --   HGB 9.3*   < > 9.5* 9.7* 9.2*  HCT 30.2*   < > 30.7* 31.4* 29.3*  MCV 81.4  --  81.0 81.8 81.8  PLT 222   < > 246 257 276   < > = values in this interval not displayed.   Cardiac Enzymes: Recent Labs  Lab 01/02/20 0750  CKTOTAL 568*   CBG: Recent Labs  Lab 01/07/20 0851 01/07/20 1107 01/07/20 1701 01/07/20 2139 01/08/20 0753  GLUCAP 116* 107* 92 93 102*    Iron Studies: No results for input(s): IRON, TIBC, TRANSFERRIN, FERRITIN in the last 72 hours. Studies/Results: No results found.  Medications: Infusions: . sodium chloride      Scheduled Medications: . atenolol  25 mg Oral Daily  . diltiazem  180 mg Oral Daily  . enoxaparin (LOVENOX) injection  65 mg Subcutaneous Daily  . furosemide  80 mg Intravenous Q8H  . gabapentin  800 mg Oral BID  . insulin aspart  0-5 Units Subcutaneous QHS  . insulin aspart  0-9 Units Subcutaneous TID WC  . insulin glargine  20 Units Subcutaneous Daily  . pantoprazole  40 mg Oral Daily  . potassium chloride  40 mEq Oral TID  . sodium chloride flush  3 mL Intravenous Q12H  . sodium chloride flush  3 mL Intravenous Q12H    have reviewed scheduled and prn medications.  Physical Exam: General:NAD, comfortable Heart:RRR, s1s2 nl Lungs:clear b/l, no crackle Abdomen:soft, Non-tender, non-distended Extremities: Bilateral lower extremities pitting edema++ Neurology: Alert awake and following commands  [name] [name] 01/08/2020,10:35 AM  LOS: 4 days  Pager: [phone]

## 2020-01-08 NOTE — Progress Notes (Signed)
Patient having nausea and dry heaves.  Will notify Dr. Lonny Prude.

## 2020-01-08 NOTE — Plan of Care (Signed)

## 2020-01-09 ENCOUNTER — Inpatient Hospital Stay (HOSPITAL_COMMUNITY): Payer: Medicare HMO

## 2020-01-09 DIAGNOSIS — I10 Essential (primary) hypertension: Secondary | ICD-10-CM | POA: Diagnosis not present

## 2020-01-09 DIAGNOSIS — R52 Pain, unspecified: Secondary | ICD-10-CM | POA: Diagnosis not present

## 2020-01-09 DIAGNOSIS — I5189 Other ill-defined heart diseases: Secondary | ICD-10-CM | POA: Diagnosis not present

## 2020-01-09 DIAGNOSIS — R140 Abdominal distension (gaseous): Secondary | ICD-10-CM | POA: Diagnosis not present

## 2020-01-09 LAB — RENAL FUNCTION PANEL
Albumin: 2.9 g/dL — ABNORMAL LOW (ref 3.5–5.0)
Anion gap: 9 (ref 5–15)
BUN: 28 mg/dL — ABNORMAL HIGH (ref 6–20)
CO2: 24 mmol/L (ref 22–32)
Calcium: 9.2 mg/dL (ref 8.9–10.3)
Chloride: 102 mmol/L (ref 98–111)
Creatinine, Ser: 1.94 mg/dL — ABNORMAL HIGH (ref 0.61–1.24)
GFR, Estimated: 39 mL/min — ABNORMAL LOW (ref >60)
Glucose, Bld: 101 mg/dL — ABNORMAL HIGH (ref 70–99)
Phosphorus: 5.2 mg/dL — ABNORMAL HIGH (ref 2.5–4.6)
Potassium: 4.2 mmol/L (ref 3.5–5.1)
Sodium: 135 mmol/L (ref 135–145)

## 2020-01-09 LAB — URINALYSIS, ROUTINE W REFLEX MICROSCOPIC
Bacteria, UA: NONE SEEN
Bilirubin Urine: NEGATIVE
Glucose, UA: 50 mg/dL — AB
Ketones, ur: NEGATIVE mg/dL
Leukocytes,Ua: NEGATIVE
Nitrite: NEGATIVE
Protein, ur: >=300 mg/dL — AB
Specific Gravity, Urine: 1.018 (ref 1.005–1.030)
pH: 5 (ref 5.0–8.0)

## 2020-01-09 LAB — BASIC METABOLIC PANEL
Anion gap: 12 (ref 5–15)
BUN: 30 mg/dL — ABNORMAL HIGH (ref 6–20)
CO2: 22 mmol/L (ref 22–32)
Calcium: 9 mg/dL (ref 8.9–10.3)
Chloride: 100 mmol/L (ref 98–111)
Creatinine, Ser: 2.18 mg/dL — ABNORMAL HIGH (ref 0.61–1.24)
GFR, Estimated: 34 mL/min — ABNORMAL LOW (ref >60)
Glucose, Bld: 100 mg/dL — ABNORMAL HIGH (ref 70–99)
Potassium: 4.6 mmol/L (ref 3.5–5.1)
Sodium: 134 mmol/L — ABNORMAL LOW (ref 135–145)

## 2020-01-09 LAB — HEPATIC FUNCTION PANEL
ALT: 35 U/L (ref 0–44)
AST: 49 U/L — ABNORMAL HIGH (ref 15–41)
Albumin: 2.9 g/dL — ABNORMAL LOW (ref 3.5–5.0)
Alkaline Phosphatase: 94 U/L (ref 38–126)
Bilirubin, Direct: <0.1 mg/dL (ref 0.0–0.2)
Total Bilirubin: 0.6 mg/dL (ref 0.3–1.2)
Total Protein: 6.9 g/dL (ref 6.5–8.1)

## 2020-01-09 LAB — PROTEIN / CREATININE RATIO, URINE
Creatinine, Urine: 173.05 mg/dL
Protein Creatinine Ratio: 5.22 mg/mg{Cre} — ABNORMAL HIGH (ref 0.00–0.15)
Total Protein, Urine: 903 mg/dL

## 2020-01-09 LAB — GLUCOSE, CAPILLARY
Glucose-Capillary: 99 mg/dL (ref 70–99)
Glucose-Capillary: 90 mg/dL (ref 70–99)
Glucose-Capillary: 83 mg/dL (ref 70–99)
Glucose-Capillary: 92 mg/dL (ref 70–99)

## 2020-01-09 LAB — MAGNESIUM: Magnesium: 2 mg/dL (ref 1.7–2.4)

## 2020-01-09 MED ORDER — CHLORHEXIDINE GLUCONATE CLOTH 2 % EX PADS
6.0000 | MEDICATED_PAD | Freq: Every day | CUTANEOUS | Status: DC
  Administered 2020-01-09 – 2020-01-31 (×19): 6 via TOPICAL

## 2020-01-09 MED ORDER — OXYCODONE-ACETAMINOPHEN 5-325 MG PO TABS
1.0000 | ORAL_TABLET | Freq: Once | ORAL | Status: DC
  Filled 2020-01-09: qty 1

## 2020-01-09 MED ORDER — TAMSULOSIN HCL 0.4 MG PO CAPS
0.4000 mg | ORAL_CAPSULE | Freq: Every day | ORAL | Status: DC
  Administered 2020-01-09 – 2020-01-19 (×10): 0.4 mg via ORAL
  Filled 2020-01-09 (×11): qty 1

## 2020-01-09 MED ORDER — LIDOCAINE HCL URETHRAL/MUCOSAL 2 % EX GEL
1.0000 "application " | Freq: Once | CUTANEOUS | Status: AC
  Administered 2020-01-09: 1 via URETHRAL
  Filled 2020-01-09: qty 11

## 2020-01-09 NOTE — Progress Notes (Signed)
PROGRESS NOTE    [name]  OMA:[id number] DOB: 1960/12/10 DOA: 12/31/2019 PCP: [name], NP   Brief Narrative: [name] is a 59 y.o. malewith medical history significant forhypertension, poorly controlled diabetes mellitus, chronic leukopenia, history of CVA, and history of substance abuse. Patient presented secondary to body aches with evidence of mild rhabdomyolysis and volume overload, found to have nephrotic syndrome and AKI. Started on IV diuresis.   Assessment & Plan:   Principal Problem:   Generalized body aches Active Problems:   Hypertension   Substance-related disorder (HCC)   Hypokalemia   Non-traumatic rhabdomyolysis   Grade I diastolic dysfunction   Anemia   Hypokalemia due to loss of potassium   Nephrotic syndrome   Anasarca   Nephrotic syndrome Concern this is secondary to patient's underlying diabetes mellitus. Nephrology consulted and managing. Urinalysis significant for large protein/glucose with a urine protein/creatinine of 3.51. Serum albumin of around 2. Albumin IV given. UOP of 1.75 L in the last 24 hours with two unmeasured occurrences. Weight on admission of 127.7 kg. Weight of 134.2 kg yesterday -Nephrology recommendations: lasix held today; recommendations pending -Daily weights  Abdominal distension Significant. Associated abdominal pain. Patient states he is not passing gas. Multiple episodes of watery stool. Decreased bowel sounds. No obstruction or volvulus on CT abdomen (11/10). Possibly some colitis, but if so, mild. No leukocytosis. CT scan also with urinary distension. GI pathogen panel negative. -Serial abdominal exams -Repeat abdominal x-ray  AKI on CKD stage III AKI secondary to hypertensive emergency/ACE inhibitor use. Baseline creatinine of about 1.1. Peak creatinine of 1.42. benazepril held. Back towards baseline initially with acute worsening. 1.94 today in setting of diuresis and urinary retention -Renal  ultrasound to assess for hydronephrosis  Chronic diastolic heart dysfunction Stable.  Primary hypertension Hypertensive emergency on admission. Blood pressure better controlled now but still elevated at times. -Continue atenolol and diltiazem  Anemia Normocytic. Anemia panel suggests possible mixed picture. Stable. No evidence of bleed. In setting of diarrhea as well as chronic kidney disease. -FOBT ordered but still pending  Hypokalemia Improved with supplementation -BMP daily  Diabetes mellitus, type 2 Uncontrolled with hyperglycemia and hypoglycemia Patient is on Lantus 40 units qhs and Januemet as an outpatient. -Continue Lntus 20 units daily and continue SSI  Bicuspid aortic valve Moderate aortic valve stenosis Associated LVH without evidence of LV systolic dysfunction. No LV dilation. -Cardiology follow-up outpatient  Urinary retention Bladder scan on 11/11 was significant for 628mL+ of retained urine. Patient continues to retain urine. Patient continues to decline in/out catheterization and foley catheters unless he has anesthesia put him to sleep. Risks discussed with patient. Discussed giving pain medication prior to insertion followed by urology consult for insertion if initial attempt failed, however patient continues to decline as he states eh made a vow to himself to never have a foley placed because of pain. -Continue to monitor UOP -Bladder scan prn -Renal ultrasound as mentioned above  Chest pain Likely reflux related but patient does have evidence of cholelithiasis with stone located in the neck of the gallbladder. Chest pain resolved -Continue Protonix -Hepatic function panel; if concerning, will obtain RUQ US   DVT prophylaxis: Lovenox Code Status:   Code Status: Full Code Family Communication: None at bedside Disposition Plan: Discharge to SNF pending nephrology recommendations in addition to improvement of diarrhea. Likely several days   Consultants:     Nephrology  Procedures:   TRANSTHORACIC ECHOCARDIOGRAM (01/01/2020) IMPRESSIONS    1. Left  ventricular ejection fraction, by estimation, is 65 to 70%. The  left ventricle has normal function. The left ventricle has no regional  wall motion abnormalities. There is moderate left ventricular hypertrophy.  Hyperdense myocardium, consider  hypertensive cardiomyopathy versus infilrative cardiomyopathy. Left  ventricular diastolic parameters are consistent with Grade I diastolic  dysfunction (impaired relaxation).  2. Right ventricular systolic function is normal. The right ventricular  size is normal. There is mildly elevated pulmonary artery systolic  pressure. The estimated right ventricular systolic pressure is 74.0 mmHg.  3. Left atrial size was mildly dilated.  4. A small pericardial effusion is present. The pericardial effusion is  circumferential.  5. The mitral valve is grossly normal. Mild mitral valve regurgitation.  6. The aortic valve is functionally bicuspid. There is moderate  calcification of the aortic valve. Aortic valve regurgitation is trivial.  Moderate aortic valve stenosis. Aortic valve mean gradient measures 13.0  mmHg. Aortic valve Vmax measures 2.37 m/s.  Dimentionless index 0.32.  7. The inferior vena cava is dilated in size with <50% respiratory  variability, suggesting right atrial pressure of 15 mmHg.   Antimicrobials:  None    Subjective: No chest pain. Abdominal pain improved. No concerns today.  Objective: Vitals:   01/09/20 0000 01/09/20 0307 01/09/20 0556 01/09/20 0948  BP: (!) 144/84 (!) 137/97 117/87 (!) 136/100  Pulse: 65 67 67 67  Resp: 20 20 20    Temp: 97.8 F (36.6 C) 97.6 F (36.4 C) 97.6 F (36.4 C)   TempSrc: Oral Oral Oral   SpO2: 98% 97% 98%   Weight:      Height:        Intake/Output Summary (Last 24 hours) at 01/09/2020 1007 Last data filed at 01/09/2020 0600 Gross per 24 hour  Intake 259 ml  Output 1750 ml   Net -1491 ml   Filed Weights   01/05/20 0511 01/06/20 0334 01/08/20 0500  Weight: (!) 136.8 kg 135 kg 134.2 kg    Examination:  General exam: Appears calm and comfortable Respiratory system: Clear to auscultation. Respiratory effort normal. Cardiovascular system: S1 & S2 heard, RRR. No murmurs, rubs, gallops or clicks. Gastrointestinal system: Abdomen is significantly distended, soft and nontender. Prominent left upper abdominal mass which is likely large intestine. Decreased bowel sounds heard. Central nervous system: Alert and oriented. No focal neurological deficits. Musculoskeletal: BLE edema. No calf tenderness Skin: No cyanosis. No rashes Psychiatry: Judgement and insight appear normal. Mood & affect appropriate.     Data Reviewed: I have personally reviewed following labs and imaging studies  CBC Lab Results  Component Value Date   WBC 3.0 (L) 01/06/2020   RBC 3.58 (L) 01/06/2020   HGB 9.2 (L) 01/06/2020   HCT 29.3 (L) 01/06/2020   MCV 81.8 01/06/2020   MCH 25.7 (L) 01/06/2020   PLT 276 01/06/2020   MCHC 31.4 01/06/2020   RDW 15.0 01/06/2020   LYMPHSABS 1.2 01/04/2020   MONOABS 0.5 01/04/2020   EOSABS 0.2 01/04/2020   BASOSABS 0.0 81/44/8185     Last metabolic panel Lab Results  Component Value Date   NA 135 01/09/2020   K 4.2 01/09/2020   CL 102 01/09/2020   CO2 24 01/09/2020   BUN 28 (H) 01/09/2020   CREATININE 1.94 (H) 01/09/2020   GLUCOSE 101 (H) 01/09/2020   GFRNONAA 39 (L) 01/09/2020   GFRAA >60 01/29/2018   CALCIUM 9.2 01/09/2020   PHOS 5.2 (H) 01/09/2020   PROT 5.5 (L) 01/05/2020   ALBUMIN 2.9 (L)  01/09/2020   BILITOT 0.5 01/05/2020   ALKPHOS 74 01/05/2020   AST 19 01/05/2020   ALT 13 01/05/2020   ANIONGAP 9 01/09/2020    CBG (last 3)  Recent Labs    01/08/20 1657 01/08/20 2057 01/09/20 0728  GLUCAP 106* 104* 99     GFR: Estimated Creatinine Clearance: 55.6 mL/min (A) (by C-G formula based on SCr of 1.94 mg/dL  (H)).  Coagulation Profile: No results for input(s): INR, PROTIME in the last 168 hours.  Recent Results (from the past 240 hour(s))  Respiratory Panel by RT PCR (Flu A&B, Covid) - Nasopharyngeal Swab     Status: None   Collection Time: 12/31/19  2:00 PM   Specimen: Nasopharyngeal Swab  Result Value Ref Range Status   SARS Coronavirus 2 by RT PCR NEGATIVE NEGATIVE Final    Comment: (NOTE) SARS-CoV-2 target nucleic acids are NOT DETECTED.  The SARS-CoV-2 RNA is generally detectable in upper respiratoy specimens during the acute phase of infection. The lowest concentration of SARS-CoV-2 viral copies this assay can detect is 131 copies/mL. A negative result does not preclude SARS-Cov-2 infection and should not be used as the sole basis for treatment or other patient management decisions. A negative result may occur with  improper specimen collection/handling, submission of specimen other than nasopharyngeal swab, presence of viral mutation(s) within the areas targeted by this assay, and inadequate number of viral copies (<131 copies/mL). A negative result must be combined with clinical observations, patient history, and epidemiological information. The expected result is Negative.  Fact Sheet for Patients:  PinkCheek.be  Fact Sheet for Healthcare Providers:  GravelBags.it  This test is no t yet approved or cleared by the [place] FDA and  has been authorized for detection and/or diagnosis of SARS-CoV-2 by FDA under an Emergency Use Authorization (EUA). This EUA will remain  in effect (meaning this test can be used) for the duration of the COVID-19 declaration under Section 564(b)(1) of the Act, 21 U.S.C. section 360bbb-3(b)(1), unless the authorization is terminated or revoked sooner.     Influenza A by PCR NEGATIVE NEGATIVE Final   Influenza B by PCR NEGATIVE NEGATIVE Final    Comment: (NOTE) The Xpert Xpress  SARS-CoV-2/FLU/RSV assay is intended as an aid in  the diagnosis of influenza from Nasopharyngeal swab specimens and  should not be used as a sole basis for treatment. Nasal washings and  aspirates are unacceptable for Xpert Xpress SARS-CoV-2/FLU/RSV  testing.  Fact Sheet for Patients: PinkCheek.be  Fact Sheet for Healthcare Providers: GravelBags.it  This test is not yet approved or cleared by the [place] FDA and  has been authorized for detection and/or diagnosis of SARS-CoV-2 by  FDA under an Emergency Use Authorization (EUA). This EUA will remain  in effect (meaning this test can be used) for the duration of the  Covid-19 declaration under Section 564(b)(1) of the Act, 21  U.S.C. section 360bbb-3(b)(1), unless the authorization is  terminated or revoked. Performed at [facility], [place] [address] [place], [place] [address]   SARS Coronavirus 2 by RT PCR (hospital order, performed in [facility] hospital lab) Nasopharyngeal Nasopharyngeal Swab     Status: None   Collection Time: 01/03/20  5:09 PM   Specimen: Nasopharyngeal Swab  Result Value Ref Range Status   SARS Coronavirus 2 NEGATIVE NEGATIVE Final    Comment: (NOTE) SARS-CoV-2 target nucleic acids are NOT DETECTED.  The SARS-CoV-2 RNA is generally detectable in upper and lower respiratory specimens during the  acute phase of infection. The lowest concentration of SARS-CoV-2 viral copies this assay can detect is 250 copies / mL. A negative result does not preclude SARS-CoV-2 infection and should not be used as the sole basis for treatment or other patient management decisions.  A negative result may occur with improper specimen collection / handling, submission of specimen other than nasopharyngeal swab, presence of viral mutation(s) within the areas targeted by this assay, and inadequate number of viral copies (<250 copies / mL). A negative result must  be combined with clinical observations, patient history, and epidemiological information.  Fact Sheet for Patients:   StrictlyIdeas.no  Fact Sheet for Healthcare Providers: BankingDealers.co.za  This test is not yet approved or  cleared by the [place] FDA and has been authorized for detection and/or diagnosis of SARS-CoV-2 by FDA under an Emergency Use Authorization (EUA).  This EUA will remain in effect (meaning this test can be used) for the duration of the COVID-19 declaration under Section 564(b)(1) of the Act, 21 U.S.C. section 360bbb-3(b)(1), unless the authorization is terminated or revoked sooner.  Performed at [facility], [place] [address] [place], [place] [address]   Gastrointestinal Panel by PCR , Stool     Status: None   Collection Time: 01/06/20 12:26 PM   Specimen: Stool  Result Value Ref Range Status   Campylobacter species NOT DETECTED NOT DETECTED Final   Plesimonas shigelloides NOT DETECTED NOT DETECTED Final   Salmonella species NOT DETECTED NOT DETECTED Final   Yersinia enterocolitica NOT DETECTED NOT DETECTED Final   Vibrio species NOT DETECTED NOT DETECTED Final   Vibrio cholerae NOT DETECTED NOT DETECTED Final   Enteroaggregative E coli (EAEC) NOT DETECTED NOT DETECTED Final   Enteropathogenic E coli (EPEC) NOT DETECTED NOT DETECTED Final   Enterotoxigenic E coli (ETEC) NOT DETECTED NOT DETECTED Final   Shiga like toxin producing E coli (STEC) NOT DETECTED NOT DETECTED Final   Shigella/Enteroinvasive E coli (EIEC) NOT DETECTED NOT DETECTED Final   Cryptosporidium NOT DETECTED NOT DETECTED Final   Cyclospora cayetanensis NOT DETECTED NOT DETECTED Final   Entamoeba histolytica NOT DETECTED NOT DETECTED Final   Giardia lamblia NOT DETECTED NOT DETECTED Final   Adenovirus F40/41 NOT DETECTED NOT DETECTED Final   Astrovirus NOT DETECTED NOT DETECTED Final   Norovirus GI/GII NOT DETECTED NOT DETECTED  Final   Rotavirus A NOT DETECTED NOT DETECTED Final   Sapovirus (I, II, IV, and V) NOT DETECTED NOT DETECTED Final    Comment: Performed at [facility], [address], [place], [place] [address]        Radiology Studies: No results found.      Scheduled Meds: . atenolol  25 mg Oral Daily  . diltiazem  180 mg Oral Daily  . enoxaparin (LOVENOX) injection  65 mg Subcutaneous Daily  . gabapentin  800 mg Oral BID  . insulin aspart  0-5 Units Subcutaneous QHS  . insulin aspart  0-9 Units Subcutaneous TID WC  . insulin glargine  20 Units Subcutaneous Daily  . pantoprazole  40 mg Oral Daily  . sodium chloride flush  3 mL Intravenous Q12H  . sodium chloride flush  3 mL Intravenous Q12H   Continuous Infusions: . sodium chloride       LOS: 5 days     Cordelia Poche, MD [facility] 01/09/2020, 10:07 AM  If 7PM-7AM, please contact night-coverage [email or web address]

## 2020-01-09 NOTE — Progress Notes (Addendum)
[facility] NEPHROLOGY PROGRESS NOTE  Assessment/ Plan: Pt is a 59 y.o. yo male  with history of hypertension, poorly controlled diabetes since 1989 with neuropathy, retinopathy A1c 10.6, stroke, CHF who was admitted on 11/5 for generalized body pain, hypertensive emergency, hypokalemia seen as a consultation for the evaluation of proteinuria, elevated creatinine level and lower extremity edema. The baseline creatinine level was around 1.1. He is not sure about his medication but denies using any diuretics prior to this hospitalization. Per chart, home medication includes atenolol, benazepril, diltiazem, Lasix, ibuprofen as needed.  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 Urinalysis has glucosuria and proteinuria without hematuria.  Hep B, hep C, HIV, ANA and kappa lambda ratio unremarkable.  Kidney ultrasound with normal cortical thickness and without obstruction. Started IV Lasix without much increased urine output and edema is about the same.  The bladder scan showed urinary retention and patient declined insertion of Foley catheter.  Now with rising creatinine level, I am concerned that he may have developed hydronephrosis/obstructive nephropathy.  Discontinue diuretics today and obtaining repeat kidney ultrasound. He is still refusing Foley catheter. I will continue to hold ACE inhibitor or ARB while diuresing however this needs to be started after maintaining euvolemia.   #Acute kidney injury due to hypertensive emergency and hemodynamic change related with ACE inhibitor. 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 Management as above. Creatinine level up today however needing diuretics for lower extremity edema.  Need to rule out obstruction as discussed above. Avoid nephrotoxins including NSAIDs or IV contrast.  #Bilateral lower extremity edema: diuretics as above.  #Hypokalemia due to  diuretics: Repleted potassium chloride. Monitor electrolytes especially with diuretics.   #Hypertensive urgency during admission: Unsure if patient was taking his medication. Currently on atenolol and diltiazem. IV Lasix as above. Monitor blood pressure.  #Somnolent, recommend to avoid narcotics/sedatives.  Subjective: Seen and examined.  Urine output 1.7 L.  Reports generalized body pain, nausea and overall not feeling well.  No new event. Objective Vital signs in last 24 hours: Vitals:   01/09/20 0000 01/09/20 0307 01/09/20 0556 01/09/20 0948  BP: (!) 144/84 (!) 137/97 117/87 (!) 136/100  Pulse: 65 67 67 67  Resp: 20 20 20    Temp: 97.8 F (36.6 C) 97.6 F (36.4 C) 97.6 F (36.4 C)   TempSrc: Oral Oral Oral   SpO2: 98% 97% 98%   Weight:      Height:       Weight change:   Intake/Output Summary (Last 24 hours) at 01/09/2020 1132 Last data filed at 01/09/2020 0600 Gross per 24 hour  Intake 259 ml  Output 1750 ml  Net -1491 ml       Labs: Basic Metabolic Panel: Recent Labs  Lab 01/07/20 0503 01/08/20 0208 01/09/20 0321  NA 137 136 135  K 3.2* 3.8 4.2  CL 103 104 102  CO2 26 21* 24  GLUCOSE 113* 96 101*  BUN 18 22* 28*  CREATININE 1.27* 1.56* 1.94*  CALCIUM 9.0 8.9 9.2  PHOS 4.2 4.9* 5.2*   Liver Function Tests: Recent Labs  Lab 01/04/20 0346 01/04/20 0346 01/05/20 0230 01/06/20 0356 01/08/20 0208 01/09/20 0321 01/09/20 1027  AST 21  --  19  --   --   --  49*  ALT 14  --  13  --   --   --  35  ALKPHOS 72  --  74  --   --   --  94  BILITOT 0.3  --  0.5  --   --   --  0.6  PROT 5.4*  --  5.5*  --   --   --  6.9  ALBUMIN 1.8*   < > 2.1*   < > 2.7* 2.9* 2.9*   < > = values in this interval not displayed.   No results for input(s): LIPASE, AMYLASE in the last 168 hours. No results for input(s): AMMONIA in the last 168 hours. CBC: Recent Labs  Lab 01/03/20 0357 01/04/20 0346 01/06/20 0928  WBC 3.5* 3.4* 3.0*  NEUTROABS 1.5* 1.5*  --   HGB  9.5* 9.7* 9.2*  HCT 30.7* 31.4* 29.3*  MCV 81.0 81.8 81.8  PLT 246 257 276   Cardiac Enzymes: No results for input(s): CKTOTAL, CKMB, CKMBINDEX, TROPONINI in the last 168 hours. CBG: Recent Labs  Lab 01/08/20 1133 01/08/20 1657 01/08/20 2057 01/09/20 0728 01/09/20 1119  GLUCAP 124* 106* 104* 99 90    Iron Studies: No results for input(s): IRON, TIBC, TRANSFERRIN, FERRITIN in the last 72 hours. Studies/Results: No results found.  Medications: Infusions: . sodium chloride      Scheduled Medications: . atenolol  25 mg Oral Daily  . diltiazem  180 mg Oral Daily  . enoxaparin (LOVENOX) injection  65 mg Subcutaneous Daily  . gabapentin  800 mg Oral BID  . insulin aspart  0-5 Units Subcutaneous QHS  . insulin aspart  0-9 Units Subcutaneous TID WC  . insulin glargine  20 Units Subcutaneous Daily  . pantoprazole  40 mg Oral Daily  . sodium chloride flush  3 mL Intravenous Q12H  . sodium chloride flush  3 mL Intravenous Q12H    have reviewed scheduled and prn medications.  Physical Exam: General:NAD, comfortable Heart:RRR, s1s2 nl Lungs:clear b/l, no crackle Abdomen:soft, Non-tender, distended. Extremities: Bilateral lower extremities pitting edema++, not much improvement Neurology: Alert awake and following commands  Mable Lashley Tanna Furry 01/09/2020,11:32 AM  LOS: 5 days  Pager: [phone]

## 2020-01-10 DIAGNOSIS — I5189 Other ill-defined heart diseases: Secondary | ICD-10-CM | POA: Diagnosis not present

## 2020-01-10 DIAGNOSIS — R52 Pain, unspecified: Secondary | ICD-10-CM | POA: Diagnosis not present

## 2020-01-10 DIAGNOSIS — I10 Essential (primary) hypertension: Secondary | ICD-10-CM | POA: Diagnosis not present

## 2020-01-10 DIAGNOSIS — R140 Abdominal distension (gaseous): Secondary | ICD-10-CM | POA: Diagnosis not present

## 2020-01-10 LAB — BASIC METABOLIC PANEL
Anion gap: 9 (ref 5–15)
BUN: 33 mg/dL — ABNORMAL HIGH (ref 6–20)
CO2: 22 mmol/L (ref 22–32)
Calcium: 9.1 mg/dL (ref 8.9–10.3)
Chloride: 104 mmol/L (ref 98–111)
Creatinine, Ser: 2.31 mg/dL — ABNORMAL HIGH (ref 0.61–1.24)
GFR, Estimated: 32 mL/min — ABNORMAL LOW (ref >60)
Glucose, Bld: 103 mg/dL — ABNORMAL HIGH (ref 70–99)
Potassium: 4.5 mmol/L (ref 3.5–5.1)
Sodium: 135 mmol/L (ref 135–145)

## 2020-01-10 LAB — URINE CULTURE: Culture: NO GROWTH

## 2020-01-10 LAB — GLUCOSE, CAPILLARY
Glucose-Capillary: 112 mg/dL — ABNORMAL HIGH (ref 70–99)
Glucose-Capillary: 96 mg/dL (ref 70–99)
Glucose-Capillary: 92 mg/dL (ref 70–99)
Glucose-Capillary: 137 mg/dL — ABNORMAL HIGH (ref 70–99)

## 2020-01-10 MED ORDER — FUROSEMIDE 80 MG PO TABS
80.0000 mg | ORAL_TABLET | Freq: Three times a day (TID) | ORAL | Status: DC
  Administered 2020-01-10: 80 mg via ORAL
  Filled 2020-01-10: qty 1

## 2020-01-10 MED ORDER — BISACODYL 10 MG RE SUPP
10.0000 mg | Freq: Once | RECTAL | Status: AC
  Administered 2020-01-10: 10 mg via RECTAL
  Filled 2020-01-10 (×2): qty 1

## 2020-01-10 MED ORDER — FUROSEMIDE 10 MG/ML IJ SOLN
10.0000 mg/h | INTRAVENOUS | Status: DC
  Administered 2020-01-10 – 2020-01-13 (×5): 10 mg/h via INTRAVENOUS
  Filled 2020-01-10 (×10): qty 20

## 2020-01-10 NOTE — Consult Note (Signed)
[facility] Nurse Consult Note: Patient receiving care in [place]. Reason for Consult: "condom catheter removed, wounds on penis noted" Wound type: There is a great deal of edema to the scrotal area.  The patient now has a foley catheter.  The penis is down in the edematous area.  It is painful to the patient when I try to examine the glans penis.  I can see 2 small, scattered areas that are pink and shallow.  I am not sure if these are the areas referred to in the order in quotes above. Pressure Injury POA: Yes/No/NA Measurement: Wound bed: Drainage (amount, consistency, odor)  Periwound: Dressing procedure/placement/frequency: Continue routine cleansing of penis and foley catheter.  Can apply a small amount of [employer] clear (purple and white tube in clean utility) to any open areas observed. Monitor the wound area(s) for worsening of condition such as: Signs/symptoms of infection,  Increase in size,  Development of or worsening of odor, Development of pain, or increased pain at the affected locations.  Notify the medical team if any of these develop.  Thank you for the consult. [facility] nurse will not follow at this time.  Please re-consult the [facility] team if needed.  Val Riles, RN, MSN, CWOCN, CNS-BC, pager [phone]

## 2020-01-10 NOTE — Progress Notes (Signed)
PROGRESS NOTE    [name]  IPJ:[id number] DOB: 11-Aug-1960 DOA: 12/31/2019 PCP: [name], NP   Brief Narrative: [name] is a 59 y.o. malewith medical history significant forhypertension, poorly controlled diabetes mellitus, chronic leukopenia, history of CVA, and history of substance abuse. Patient presented secondary to body aches with evidence of mild rhabdomyolysis and volume overload, found to have nephrotic syndrome and AKI. Started on IV diuresis.   Assessment & Plan:   Principal Problem:   Generalized body aches Active Problems:   Hypertension   Substance-related disorder (HCC)   Hypokalemia   Non-traumatic rhabdomyolysis   Grade I diastolic dysfunction   Anemia   Hypokalemia due to loss of potassium   Nephrotic syndrome   Anasarca   Nephrotic syndrome Concern this is secondary to patient's underlying diabetes mellitus. Nephrology consulted and managing. Urinalysis significant for large protein/glucose with a urine protein/creatinine of 3.51. Serum albumin of around 2. Albumin IV given. UOP of 2 L in the last 24 hours with two unmeasured occurrences. Weight on admission of 127.7 kg. Weight of 121.8 kg today -Nephrology recommendations: Started on Lasix IV infusion -Daily weights  Abdominal distension Significant. Associated abdominal pain. Patient states he is not passing gas. Multiple episodes of watery stool. Decreased bowel sounds. No obstruction or volvulus on CT abdomen (11/10). Possibly some colitis, but if so, mild. No leukocytosis. CT scan also with urinary distension. GI pathogen panel negative. Repeat x-ray with ileus vs distal obstruction. Improved today after foley placement -Dulcolax suppository  AKI on CKD stage III AKI secondary to hypertensive emergency/ACE inhibitor use. Baseline creatinine of about 1.1. Peak creatinine of 1.42. benazepril held. Back towards baseline initially with acute worsening. 2.31 today in setting of diuresis  and urinary retention. No hydronephrosis on renal ultrasound -Per Nephrology  Chronic diastolic heart dysfunction Stable.  Primary hypertension Hypertensive emergency on admission. Blood pressure better controlled now but still elevated at times. -Continue atenolol and diltiazem  Anemia Normocytic. Anemia panel suggests possible mixed picture. Stable. No evidence of bleed. In setting of diarrhea as well as chronic kidney disease. -FOBT ordered but still pending  Hypokalemia Improved with supplementation -BMP daily  Diabetes mellitus, type 2 Uncontrolled with hyperglycemia and hypoglycemia Patient is on Lantus 40 units qhs and Januemet as an outpatient. -Continue Lantus 20 units daily and continue SSI  Bicuspid aortic valve Moderate aortic valve stenosis Associated LVH without evidence of LV systolic dysfunction. No LV dilation. -Cardiology follow-up outpatient  Urinary retention Bladder scan on 11/11 was significant for 677mL+ of retained urine. Continued urinary retention and patient continued to refuse foley placement until 11/14. Foley placed successfully on 11/14 -Continue to monitor UOP  Chest pain Likely reflux related but patient does have evidence of cholelithiasis with stone located in the neck of the gallbladder. Chest pain resolved -Continue Protonix   DVT prophylaxis: Lovenox Code Status:   Code Status: Full Code Family Communication: None at bedside Disposition Plan: Discharge to SNF pending nephrology recommendations in addition to improvement of diarrhea. Likely several days   Consultants:   Nephrology  Procedures:   TRANSTHORACIC ECHOCARDIOGRAM (01/01/2020) IMPRESSIONS    1. Left ventricular ejection fraction, by estimation, is 65 to 70%. The  left ventricle has normal function. The left ventricle has no regional  wall motion abnormalities. There is moderate left ventricular hypertrophy.  Hyperdense myocardium, consider  hypertensive  cardiomyopathy versus infilrative cardiomyopathy. Left  ventricular diastolic parameters are consistent with Grade I diastolic  dysfunction (impaired relaxation).  2. Right ventricular systolic function is normal. The right ventricular  size is normal. There is mildly elevated pulmonary artery systolic  pressure. The estimated right ventricular systolic pressure is 52.7 mmHg.  3. Left atrial size was mildly dilated.  4. A small pericardial effusion is present. The pericardial effusion is  circumferential.  5. The mitral valve is grossly normal. Mild mitral valve regurgitation.  6. The aortic valve is functionally bicuspid. There is moderate  calcification of the aortic valve. Aortic valve regurgitation is trivial.  Moderate aortic valve stenosis. Aortic valve mean gradient measures 13.0  mmHg. Aortic valve Vmax measures 2.37 m/s.  Dimentionless index 0.32.  7. The inferior vena cava is dilated in size with <50% respiratory  variability, suggesting right atrial pressure of 15 mmHg.   Antimicrobials:  None    Subjective: No abdominal pain or chest pain. No nausea or vomiting.  Objective: Vitals:   01/09/20 1533 01/09/20 2056 01/10/20 0000 01/10/20 0407  BP:  (!) 146/81 138/70 139/75  Pulse:  68 64 75  Resp:  16 16 19   Temp: 98.7 F (37.1 C) 98.3 F (36.8 C) 98.3 F (36.8 C) 98.8 F (37.1 C)  TempSrc:  Oral Oral Axillary  SpO2: 98% 94% 95% 98%  Weight:    121.8 kg  Height:        Intake/Output Summary (Last 24 hours) at 01/10/2020 1019 Last data filed at 01/10/2020 0959 Gross per 24 hour  Intake 240 ml  Output 2235 ml  Net -1995 ml   Filed Weights   01/06/20 0334 01/08/20 0500 01/10/20 0407  Weight: 135 kg 134.2 kg 121.8 kg    Examination:  General exam: Appears calm and comfortable Respiratory system: Clear to auscultation. Respiratory effort normal. Cardiovascular system: S1 & S2 heard, RRR. No murmurs, rubs, gallops or clicks. Gastrointestinal  system: Abdomen is significantly distended, soft and nontender. LUQ mass palpated. Decreased bowel sounds heard. Central nervous system: Alert and oriented to person. No focal neurological deficits. Musculoskeletal: 2+ BLE edema. No calf tenderness Skin: No cyanosis. No rashes Psychiatry: Judgement and insight appear impaired. Mood & affect appropriate.     Data Reviewed: I have personally reviewed following labs and imaging studies  CBC Lab Results  Component Value Date   WBC 3.0 (L) 01/06/2020   RBC 3.58 (L) 01/06/2020   HGB 9.2 (L) 01/06/2020   HCT 29.3 (L) 01/06/2020   MCV 81.8 01/06/2020   MCH 25.7 (L) 01/06/2020   PLT 276 01/06/2020   MCHC 31.4 01/06/2020   RDW 15.0 01/06/2020   LYMPHSABS 1.2 01/04/2020   MONOABS 0.5 01/04/2020   EOSABS 0.2 01/04/2020   BASOSABS 0.0 78/24/2353     Last metabolic panel Lab Results  Component Value Date   NA 135 01/10/2020   K 4.5 01/10/2020   CL 104 01/10/2020   CO2 22 01/10/2020   BUN 33 (H) 01/10/2020   CREATININE 2.31 (H) 01/10/2020   GLUCOSE 103 (H) 01/10/2020   GFRNONAA 32 (L) 01/10/2020   GFRAA >60 01/29/2018   CALCIUM 9.1 01/10/2020   PHOS 5.2 (H) 01/09/2020   PROT 6.9 01/09/2020   ALBUMIN 2.9 (L) 01/09/2020   BILITOT 0.6 01/09/2020   ALKPHOS 94 01/09/2020   AST 49 (H) 01/09/2020   ALT 35 01/09/2020   ANIONGAP 9 01/10/2020    CBG (last 3)  Recent Labs    01/09/20 1622 01/09/20 2138 01/10/20 0804  GLUCAP 83 92 112*     GFR: Estimated Creatinine Clearance: 44.3 mL/min (A) (  by C-G formula based on SCr of 2.31 mg/dL (H)).  Coagulation Profile: No results for input(s): INR, PROTIME in the last 168 hours.  Recent Results (from the past 240 hour(s))  Respiratory Panel by RT PCR (Flu A&B, Covid) - Nasopharyngeal Swab     Status: None   Collection Time: 12/31/19  2:00 PM   Specimen: Nasopharyngeal Swab  Result Value Ref Range Status   SARS Coronavirus 2 by RT PCR NEGATIVE NEGATIVE Final    Comment:  (NOTE) SARS-CoV-2 target nucleic acids are NOT DETECTED.  The SARS-CoV-2 RNA is generally detectable in upper respiratoy specimens during the acute phase of infection. The lowest concentration of SARS-CoV-2 viral copies this assay can detect is 131 copies/mL. A negative result does not preclude SARS-Cov-2 infection and should not be used as the sole basis for treatment or other patient management decisions. A negative result may occur with  improper specimen collection/handling, submission of specimen other than nasopharyngeal swab, presence of viral mutation(s) within the areas targeted by this assay, and inadequate number of viral copies (<131 copies/mL). A negative result must be combined with clinical observations, patient history, and epidemiological information. The expected result is Negative.  Fact Sheet for Patients:  PinkCheek.be  Fact Sheet for Healthcare Providers:  GravelBags.it  This test is no t yet approved or cleared by the [place] FDA and  has been authorized for detection and/or diagnosis of SARS-CoV-2 by FDA under an Emergency Use Authorization (EUA). This EUA will remain  in effect (meaning this test can be used) for the duration of the COVID-19 declaration under Section 564(b)(1) of the Act, 21 U.S.C. section 360bbb-3(b)(1), unless the authorization is terminated or revoked sooner.     Influenza A by PCR NEGATIVE NEGATIVE Final   Influenza B by PCR NEGATIVE NEGATIVE Final    Comment: (NOTE) The Xpert Xpress SARS-CoV-2/FLU/RSV assay is intended as an aid in  the diagnosis of influenza from Nasopharyngeal swab specimens and  should not be used as a sole basis for treatment. Nasal washings and  aspirates are unacceptable for Xpert Xpress SARS-CoV-2/FLU/RSV  testing.  Fact Sheet for Patients: PinkCheek.be  Fact Sheet for Healthcare  Providers: GravelBags.it  This test is not yet approved or cleared by the [place] FDA and  has been authorized for detection and/or diagnosis of SARS-CoV-2 by  FDA under an Emergency Use Authorization (EUA). This EUA will remain  in effect (meaning this test can be used) for the duration of the  Covid-19 declaration under Section 564(b)(1) of the Act, 21  U.S.C. section 360bbb-3(b)(1), unless the authorization is  terminated or revoked. Performed at [facility], [place] [address] [place], [place] [address]   SARS Coronavirus 2 by RT PCR (hospital order, performed in [facility] hospital lab) Nasopharyngeal Nasopharyngeal Swab     Status: None   Collection Time: 01/03/20  5:09 PM   Specimen: Nasopharyngeal Swab  Result Value Ref Range Status   SARS Coronavirus 2 NEGATIVE NEGATIVE Final    Comment: (NOTE) SARS-CoV-2 target nucleic acids are NOT DETECTED.  The SARS-CoV-2 RNA is generally detectable in upper and lower respiratory specimens during the acute phase of infection. The lowest concentration of SARS-CoV-2 viral copies this assay can detect is 250 copies / mL. A negative result does not preclude SARS-CoV-2 infection and should not be used as the sole basis for treatment or other patient management decisions.  A negative result may occur with improper specimen collection / handling, submission of specimen other  than nasopharyngeal swab, presence of viral mutation(s) within the areas targeted by this assay, and inadequate number of viral copies (<250 copies / mL). A negative result must be combined with clinical observations, patient history, and epidemiological information.  Fact Sheet for Patients:   StrictlyIdeas.no  Fact Sheet for Healthcare Providers: BankingDealers.co.za  This test is not yet approved or  cleared by the [place] FDA and has been authorized for detection and/or  diagnosis of SARS-CoV-2 by FDA under an Emergency Use Authorization (EUA).  This EUA will remain in effect (meaning this test can be used) for the duration of the COVID-19 declaration under Section 564(b)(1) of the Act, 21 U.S.C. section 360bbb-3(b)(1), unless the authorization is terminated or revoked sooner.  Performed at [facility], [place] [address], [place], [place] [address]   Gastrointestinal Panel by PCR , Stool     Status: None   Collection Time: 01/06/20 12:26 PM   Specimen: Stool  Result Value Ref Range Status   Campylobacter species NOT DETECTED NOT DETECTED Final   Plesimonas shigelloides NOT DETECTED NOT DETECTED Final   Salmonella species NOT DETECTED NOT DETECTED Final   Yersinia enterocolitica NOT DETECTED NOT DETECTED Final   Vibrio species NOT DETECTED NOT DETECTED Final   Vibrio cholerae NOT DETECTED NOT DETECTED Final   Enteroaggregative E coli (EAEC) NOT DETECTED NOT DETECTED Final   Enteropathogenic E coli (EPEC) NOT DETECTED NOT DETECTED Final   Enterotoxigenic E coli (ETEC) NOT DETECTED NOT DETECTED Final   Shiga like toxin producing E coli (STEC) NOT DETECTED NOT DETECTED Final   Shigella/Enteroinvasive E coli (EIEC) NOT DETECTED NOT DETECTED Final   Cryptosporidium NOT DETECTED NOT DETECTED Final   Cyclospora cayetanensis NOT DETECTED NOT DETECTED Final   Entamoeba histolytica NOT DETECTED NOT DETECTED Final   Giardia lamblia NOT DETECTED NOT DETECTED Final   Adenovirus F40/41 NOT DETECTED NOT DETECTED Final   Astrovirus NOT DETECTED NOT DETECTED Final   Norovirus GI/GII NOT DETECTED NOT DETECTED Final   Rotavirus A NOT DETECTED NOT DETECTED Final   Sapovirus (I, II, IV, and V) NOT DETECTED NOT DETECTED Final    Comment: Performed at [facility], [address] [place], [place] [address]        Radiology Studies: DG Abd 1 View  Result Date: 01/09/2020 CLINICAL DATA:  Abdominal distension and pain EXAM: ABDOMEN - 1 VIEW  COMPARISON:  January 05, 2020 CT abdomen and pelvis and abdominal radiograph FINDINGS: There is colonic dilatation throughout much of the colon. No appreciable small bowel dilatation. No air-fluid levels. No free air evident on supine examination. Small pelvic calcifications most likely represent phleboliths. IMPRESSION: Suspect colonic ileus. Distal colonic obstruction potentially could present in this manner. No distal colonic obstruction is seen on recent CT. No appreciable small bowel dilatation. No air-fluid levels. No evident free air on supine examination Electronically Signed   By: Lowella Grip III M.D.   On: 01/09/2020 13:36   US RENAL  Result Date: 01/09/2020 CLINICAL DATA:  Recent acute kidney injury with abdominal pain EXAM: RENAL / URINARY TRACT ULTRASOUND COMPLETE COMPARISON:  Renal ultrasound January 04, 2020; CT abdomen and pelvis January 05, 2020 FINDINGS: Right Kidney: Renal measurements: 10.6 x 6.3 x 5.2 cm = volume: 183 mL. Echogenicity and renal cortical thickness are within normal limits. No mass, perinephric fluid, or hydronephrosis visualized. No sonographically demonstrable calculus or ureterectasis. Left Kidney: Renal measurements: 12.4 x 6.0 x 5.3 cm = volume: 207 mL. Echogenicity and renal  cortical thickness are within normal limits. No mass, perinephric fluid, or hydronephrosis visualized. No sonographically demonstrable calculus or ureterectasis. Bladder: Urinary bladder again appears somewhat distended with a measured urinary bladder volume of 497 mL. Urinary bladder wall thickness appears normal. Other: None. IMPRESSION: Persistent relative distension of the urinary bladder, raising concern for a degree of bladder outlet obstruction. Study otherwise unremarkable. Electronically Signed   By: Lowella Grip III M.D.   On: 01/09/2020 13:34        Scheduled Meds: . atenolol  25 mg Oral Daily  . Chlorhexidine Gluconate Cloth  6 each Topical Daily  . diltiazem  180  mg Oral Daily  . enoxaparin (LOVENOX) injection  65 mg Subcutaneous Daily  . gabapentin  800 mg Oral BID  . insulin aspart  0-5 Units Subcutaneous QHS  . insulin aspart  0-9 Units Subcutaneous TID WC  . insulin glargine  20 Units Subcutaneous Daily  . oxyCODONE-acetaminophen  1 tablet Oral Once  . pantoprazole  40 mg Oral Daily  . sodium chloride flush  3 mL Intravenous Q12H  . sodium chloride flush  3 mL Intravenous Q12H  . tamsulosin  0.4 mg Oral Daily   Continuous Infusions: . sodium chloride    . furosemide (LASIX) 200 mg in dextrose 5% 100 mL (2mg /mL) infusion       LOS: 6 days     Cordelia Poche, MD [facility] 01/10/2020, 10:19 AM  If 7PM-7AM, please contact night-coverage [email or web address]

## 2020-01-10 NOTE — Progress Notes (Addendum)
[facility] ROUNDING NOTE   Subjective:   Brief history  59 year old gentleman with history of hypertension poorly controlled diabetes neuropathy retinopathy hemoglobin A1c 10.6 history of CVA history of congestive heart failure admitted 12/31/2019 with hypertensive emergency hyperkalemia proteinuria and elevated creatinine.  Baseline creatinine about 1.1 mg/dL.  Has nephrotic range proteinuria secondary diabetic nephropathy.  He has an unremarkable serological evaluation.  Including hepatitis B hepatitis C HIV ANA and kappa lambda light chain ratio.  Renal ultrasound showed no obstruction.  He was started on IV Lasix now has rising creatinine.  He is refusing Foley catheter we are holding his ACE inhibitor and ARB.  Repeat ultrasound shows relative distention of urinary bladder.  Sodium 135 potassium 4.5 chloride 104 CO2 22 BUN 33 creatinine 2.3 mg/dL glucose 103.  Blood pressure 139/75 pulse 75 temperature 98.8 O2 sats 90% room air  Urine output 1.9 L 01/09/2020   Objective:  Vital signs in last 24 hours:  Temp:  [98.3 F (36.8 C)-98.8 F (37.1 C)] 98.8 F (37.1 C) (11/15 0407) Pulse Rate:  [64-75] 75 (11/15 0407) Resp:  [16-19] 19 (11/15 0407) BP: (136-146)/(70-100) 139/75 (11/15 0407) SpO2:  [94 %-98 %] 98 % (11/15 0407) Weight:  [121.8 kg] 121.8 kg (11/15 0407)  Weight change:  Filed Weights   01/06/20 0334 01/08/20 0500 01/10/20 0407  Weight: 135 kg 134.2 kg 121.8 kg    Intake/Output: I/O last 3 completed shifts: In: 259 [P.O.:250; I.V.:9] Out: 7209 [Urine:3835]   Intake/Output this shift:  No intake/output data recorded.  General:NAD, comfortable Heart:RRR, s1s2 nl Lungs:clear b/l, no crackle Abdomen:soft, Non-tender, distended. Extremities: Bilateral lower extremities pitting edema++, not much improvement Neurology: Alert awake and following commands  Basic Metabolic Panel: Recent Labs  Lab 01/04/20 0346 01/05/20 0230 01/06/20 0356  01/06/20 0356 01/07/20 0503 01/07/20 0503 01/08/20 0208 01/08/20 0208 01/09/20 0321 01/09/20 1027 01/10/20 0409  NA 134*   < > 135   < > 137  --  136  --  135 134* 135  K 3.4*   < > 3.3*   < > 3.2*  --  3.8  --  4.2 4.6 4.5  CL 102   < > 103   < > 103  --  104  --  102 100 104  CO2 26   < > 24   < > 26  --  21*  --  24 22 22   GLUCOSE 105*   < > 84   < > 113*  --  96  --  101* 100* 103*  BUN 14   < > 17   < > 18  --  22*  --  28* 30* 33*  CREATININE 1.32*   < > 1.17   < > 1.27*  --  1.56*  --  1.94* 2.18* 2.31*  CALCIUM 8.2*   < > 8.8*   < > 9.0   < > 8.9   < > 9.2 9.0 9.1  MG 1.9  --   --   --   --   --  1.9  --   --  2.0  --   PHOS  --   --  3.7  --  4.2  --  4.9*  --  5.2*  --   --    < > = values in this interval not displayed.    Liver Function Tests: Recent Labs  Lab 01/04/20 0346 01/04/20 0346 01/05/20 0230 01/05/20 0230 01/06/20 0356 01/07/20 0503 01/08/20 4709  01/09/20 0321 01/09/20 1027  AST 21  --  19  --   --   --   --   --  49*  ALT 14  --  13  --   --   --   --   --  35  ALKPHOS 72  --  74  --   --   --   --   --  94  BILITOT 0.3  --  0.5  --   --   --   --   --  0.6  PROT 5.4*  --  5.5*  --   --   --   --   --  6.9  ALBUMIN 1.8*   < > 2.1*   < > 2.8* 2.8* 2.7* 2.9* 2.9*   < > = values in this interval not displayed.   No results for input(s): LIPASE, AMYLASE in the last 168 hours. No results for input(s): AMMONIA in the last 168 hours.  CBC: Recent Labs  Lab 01/04/20 0346 01/06/20 0928  WBC 3.4* 3.0*  NEUTROABS 1.5*  --   HGB 9.7* 9.2*  HCT 31.4* 29.3*  MCV 81.8 81.8  PLT 257 276    Cardiac Enzymes: No results for input(s): CKTOTAL, CKMB, CKMBINDEX, TROPONINI in the last 168 hours.  BNP: Invalid input(s): POCBNP  CBG: Recent Labs  Lab 01/08/20 2057 01/09/20 0728 01/09/20 1119 01/09/20 1622 01/09/20 2138  GLUCAP 104* 99 90 83 82    Microbiology: Results for orders placed or performed during the hospital encounter of 12/31/19   Respiratory Panel by RT PCR (Flu A&B, Covid) - Nasopharyngeal Swab     Status: None   Collection Time: 12/31/19  2:00 PM   Specimen: Nasopharyngeal Swab  Result Value Ref Range Status   SARS Coronavirus 2 by RT PCR NEGATIVE NEGATIVE Final    Comment: (NOTE) SARS-CoV-2 target nucleic acids are NOT DETECTED.  The SARS-CoV-2 RNA is generally detectable in upper respiratoy specimens during the acute phase of infection. The lowest concentration of SARS-CoV-2 viral copies this assay can detect is 131 copies/mL. A negative result does not preclude SARS-Cov-2 infection and should not be used as the sole basis for treatment or other patient management decisions. A negative result may occur with  improper specimen collection/handling, submission of specimen other than nasopharyngeal swab, presence of viral mutation(s) within the areas targeted by this assay, and inadequate number of viral copies (<131 copies/mL). A negative result must be combined with clinical observations, patient history, and epidemiological information. The expected result is Negative.  Fact Sheet for Patients:  PinkCheek.be  Fact Sheet for Healthcare Providers:  GravelBags.it  This test is no t yet approved or cleared by the [place] FDA and  has been authorized for detection and/or diagnosis of SARS-CoV-2 by FDA under an Emergency Use Authorization (EUA). This EUA will remain  in effect (meaning this test can be used) for the duration of the COVID-19 declaration under Section 564(b)(1) of the Act, 21 U.S.C. section 360bbb-3(b)(1), unless the authorization is terminated or revoked sooner.     Influenza A by PCR NEGATIVE NEGATIVE Final   Influenza B by PCR NEGATIVE NEGATIVE Final    Comment: (NOTE) The Xpert Xpress SARS-CoV-2/FLU/RSV assay is intended as an aid in  the diagnosis of influenza from Nasopharyngeal swab specimens and  should not be used as  a sole basis for treatment. Nasal washings and  aspirates are unacceptable for Xpert Xpress SARS-CoV-2/FLU/RSV  testing.  Fact Sheet  for Patients: PinkCheek.be  Fact Sheet for Healthcare Providers: GravelBags.it  This test is not yet approved or cleared by the [place] FDA and  has been authorized for detection and/or diagnosis of SARS-CoV-2 by  FDA under an Emergency Use Authorization (EUA). This EUA will remain  in effect (meaning this test can be used) for the duration of the  Covid-19 declaration under Section 564(b)(1) of the Act, 21  U.S.C. section 360bbb-3(b)(1), unless the authorization is  terminated or revoked. Performed at [facility], [place] [address] [place], [place] [address]   SARS Coronavirus 2 by RT PCR (hospital order, performed in [facility] hospital lab) Nasopharyngeal Nasopharyngeal Swab     Status: None   Collection Time: 01/03/20  5:09 PM   Specimen: Nasopharyngeal Swab  Result Value Ref Range Status   SARS Coronavirus 2 NEGATIVE NEGATIVE Final    Comment: (NOTE) SARS-CoV-2 target nucleic acids are NOT DETECTED.  The SARS-CoV-2 RNA is generally detectable in upper and lower respiratory specimens during the acute phase of infection. The lowest concentration of SARS-CoV-2 viral copies this assay can detect is 250 copies / mL. A negative result does not preclude SARS-CoV-2 infection and should not be used as the sole basis for treatment or other patient management decisions.  A negative result may occur with improper specimen collection / handling, submission of specimen other than nasopharyngeal swab, presence of viral mutation(s) within the areas targeted by this assay, and inadequate number of viral copies (<250 copies / mL). A negative result must be combined with clinical observations, patient history, and epidemiological information.  Fact Sheet for Patients:    StrictlyIdeas.no  Fact Sheet for Healthcare Providers: BankingDealers.co.za  This test is not yet approved or  cleared by the [place] FDA and has been authorized for detection and/or diagnosis of SARS-CoV-2 by FDA under an Emergency Use Authorization (EUA).  This EUA will remain in effect (meaning this test can be used) for the duration of the COVID-19 declaration under Section 564(b)(1) of the Act, 21 U.S.C. section 360bbb-3(b)(1), unless the authorization is terminated or revoked sooner.  Performed at [facility], [place] [address], [place], [place] [address]   Gastrointestinal Panel by PCR , Stool     Status: None   Collection Time: 01/06/20 12:26 PM   Specimen: Stool  Result Value Ref Range Status   Campylobacter species NOT DETECTED NOT DETECTED Final   Plesimonas shigelloides NOT DETECTED NOT DETECTED Final   Salmonella species NOT DETECTED NOT DETECTED Final   Yersinia enterocolitica NOT DETECTED NOT DETECTED Final   Vibrio species NOT DETECTED NOT DETECTED Final   Vibrio cholerae NOT DETECTED NOT DETECTED Final   Enteroaggregative E coli (EAEC) NOT DETECTED NOT DETECTED Final   Enteropathogenic E coli (EPEC) NOT DETECTED NOT DETECTED Final   Enterotoxigenic E coli (ETEC) NOT DETECTED NOT DETECTED Final   Shiga like toxin producing E coli (STEC) NOT DETECTED NOT DETECTED Final   Shigella/Enteroinvasive E coli (EIEC) NOT DETECTED NOT DETECTED Final   Cryptosporidium NOT DETECTED NOT DETECTED Final   Cyclospora cayetanensis NOT DETECTED NOT DETECTED Final   Entamoeba histolytica NOT DETECTED NOT DETECTED Final   Giardia lamblia NOT DETECTED NOT DETECTED Final   Adenovirus F40/41 NOT DETECTED NOT DETECTED Final   Astrovirus NOT DETECTED NOT DETECTED Final   Norovirus GI/GII NOT DETECTED NOT DETECTED Final   Rotavirus A NOT DETECTED NOT DETECTED Final   Sapovirus (I, II, IV, and V) NOT DETECTED NOT DETECTED  Final     Comment: Performed at [facility], [address] [place],  [address]    Coagulation Studies: No results for input(s): LABPROT, INR in the last 72 hours.  Urinalysis: Recent Labs    01/09/20 1545  COLORURINE AMBER*  LABSPEC 1.018  PHURINE 5.0  GLUCOSEU 50*  HGBUR SMALL*  BILIRUBINUR NEGATIVE  KETONESUR NEGATIVE  PROTEINUR >=300*  NITRITE NEGATIVE  LEUKOCYTESUR NEGATIVE      Imaging: DG Abd 1 View  Result Date: 01/09/2020 CLINICAL DATA:  Abdominal distension and pain EXAM: ABDOMEN - 1 VIEW COMPARISON:  January 05, 2020 CT abdomen and pelvis and abdominal radiograph FINDINGS: There is colonic dilatation throughout much of the colon. No appreciable small bowel dilatation. No air-fluid levels. No free air evident on supine examination. Small pelvic calcifications most likely represent phleboliths. IMPRESSION: Suspect colonic ileus. Distal colonic obstruction potentially could present in this manner. No distal colonic obstruction is seen on recent CT. No appreciable small bowel dilatation. No air-fluid levels. No evident free air on supine examination Electronically Signed   By: Lowella Grip III M.D.   On: 01/09/2020 13:36   US RENAL  Result Date: 01/09/2020 CLINICAL DATA:  Recent acute kidney injury with abdominal pain EXAM: RENAL / URINARY TRACT ULTRASOUND COMPLETE COMPARISON:  Renal ultrasound January 04, 2020; CT abdomen and pelvis January 05, 2020 FINDINGS: Right Kidney: Renal measurements: 10.6 x 6.3 x 5.2 cm = volume: 183 mL. Echogenicity and renal cortical thickness are within normal limits. No mass, perinephric fluid, or hydronephrosis visualized. No sonographically demonstrable calculus or ureterectasis. Left Kidney: Renal measurements: 12.4 x 6.0 x 5.3 cm = volume: 207 mL. Echogenicity and renal cortical thickness are within normal limits. No mass, perinephric fluid, or hydronephrosis visualized. No sonographically demonstrable calculus or  ureterectasis. Bladder: Urinary bladder again appears somewhat distended with a measured urinary bladder volume of 497 mL. Urinary bladder wall thickness appears normal. Other: None. IMPRESSION: Persistent relative distension of the urinary bladder, raising concern for a degree of bladder outlet obstruction. Study otherwise unremarkable. Electronically Signed   By: Lowella Grip III M.D.   On: 01/09/2020 13:34     Medications:   . sodium chloride     . atenolol  25 mg Oral Daily  . Chlorhexidine Gluconate Cloth  6 each Topical Daily  . diltiazem  180 mg Oral Daily  . enoxaparin (LOVENOX) injection  65 mg Subcutaneous Daily  . gabapentin  800 mg Oral BID  . insulin aspart  0-5 Units Subcutaneous QHS  . insulin aspart  0-9 Units Subcutaneous TID WC  . insulin glargine  20 Units Subcutaneous Daily  . oxyCODONE-acetaminophen  1 tablet Oral Once  . pantoprazole  40 mg Oral Daily  . sodium chloride flush  3 mL Intravenous Q12H  . sodium chloride flush  3 mL Intravenous Q12H  . tamsulosin  0.4 mg Oral Daily   sodium chloride, acetaminophen **OR** acetaminophen, hydrALAZINE, LORazepam, ondansetron **OR** ondansetron (ZOFRAN) IV, oxyCODONE-acetaminophen, polyethylene glycol, sodium chloride flush  Assessment/ Plan:  1.Nephrotic syndrome likely due to diabetic nephropathy: He has severe hypoalbuminemia associated with edema and urine PCR 3.5 g. Urinalysis has glucosuria and proteinuria without hematuria.  Hep B, hep C, HIV, ANA and kappa lambda ratio unremarkable. Kidney ultrasound with normal cortical thickness and without obstruction. The bladder scan showed urinary retention and patient declined insertion of Foley catheter.  He is still refusing Foley catheter. I will continue to hold ACE inhibitor or ARB while diuresing however this  needs to be started after maintaining euvolemia.  Continues to have good urine output  2.Acute kidney injury due to hypertensive emergency and  hemodynamic change related with ACE inhibitor. 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 Management as above.  Despite lower extremity edema no Lasix has been ordered.  Patient is in no respiratory distress.  I would recommend Lasix 10mg  IV infusion.   Need to rule out obstruction as discussed above. Avoid nephrotoxins including NSAIDs or IV contrast.  Probably has a degree of acute tubular necrosis.  Renal ultrasound did demonstrate bladder distention without hydronephrosis.  3.Bilateral lower extremity edema: diuretics as above.  4.Hypokalemia due to diuretics: Repleted potassium chloride. Monitor electrolytes especially with diuretics.   5.Hypertensive urgency during admission: Unsure if patient was taking his medication. Currently on atenolol and diltiazem.  Continue oral Lasix  6.Somnolent, recommend to avoid narcotics/sedatives.   LOS: [place] @TODAY @7 :50 AM

## 2020-01-10 NOTE — Plan of Care (Signed)

## 2020-01-10 NOTE — Care Management Important Message (Signed)
Important Message  Patient Details  Name: [name] MRN: [id number] Date of Birth: 1960-07-07   Medicare Important Message Given:  Yes     Shelda Altes 01/10/2020, 12:19 PM

## 2020-01-11 DIAGNOSIS — R140 Abdominal distension (gaseous): Secondary | ICD-10-CM | POA: Diagnosis not present

## 2020-01-11 DIAGNOSIS — R52 Pain, unspecified: Secondary | ICD-10-CM | POA: Diagnosis not present

## 2020-01-11 DIAGNOSIS — I5189 Other ill-defined heart diseases: Secondary | ICD-10-CM | POA: Diagnosis not present

## 2020-01-11 DIAGNOSIS — I10 Essential (primary) hypertension: Secondary | ICD-10-CM | POA: Diagnosis not present

## 2020-01-11 LAB — BASIC METABOLIC PANEL
Anion gap: 9 (ref 5–15)
BUN: 37 mg/dL — ABNORMAL HIGH (ref 6–20)
CO2: 23 mmol/L (ref 22–32)
Calcium: 8.9 mg/dL (ref 8.9–10.3)
Chloride: 103 mmol/L (ref 98–111)
Creatinine, Ser: 2.11 mg/dL — ABNORMAL HIGH (ref 0.61–1.24)
GFR, Estimated: 36 mL/min — ABNORMAL LOW (ref >60)
Glucose, Bld: 114 mg/dL — ABNORMAL HIGH (ref 70–99)
Potassium: 4 mmol/L (ref 3.5–5.1)
Sodium: 135 mmol/L (ref 135–145)

## 2020-01-11 LAB — BLOOD GAS, ARTERIAL
Acid-base deficit: 0.5 mmol/L (ref 0.0–2.0)
Bicarbonate: 24.2 mmol/L (ref 20.0–28.0)
FIO2: 21
O2 Saturation: 92.5 %
Patient temperature: 37
pCO2 arterial: 43.3 mmHg (ref 32.0–48.0)
pH, Arterial: 7.366 (ref 7.350–7.450)
pO2, Arterial: 67.7 mmHg — ABNORMAL LOW (ref 83.0–108.0)

## 2020-01-11 LAB — IRON AND TIBC
Iron: 33 ug/dL — ABNORMAL LOW (ref 45–182)
Saturation Ratios: 13 % — ABNORMAL LOW (ref 17.9–39.5)
TIBC: 253 ug/dL (ref 250–450)
UIBC: 220 ug/dL

## 2020-01-11 LAB — CBC
HCT: 29 % — ABNORMAL LOW (ref 39.0–52.0)
Hemoglobin: 9.1 g/dL — ABNORMAL LOW (ref 13.0–17.0)
MCH: 25.4 pg — ABNORMAL LOW (ref 26.0–34.0)
MCHC: 31.4 g/dL (ref 30.0–36.0)
MCV: 81 fL (ref 80.0–100.0)
Platelets: 285 10*3/uL (ref 150–400)
RBC: 3.58 MIL/uL — ABNORMAL LOW (ref 4.22–5.81)
RDW: 15 % (ref 11.5–15.5)
WBC: 3.5 10*3/uL — ABNORMAL LOW (ref 4.0–10.5)
nRBC: 0 % (ref 0.0–0.2)

## 2020-01-11 LAB — GLUCOSE, CAPILLARY
Glucose-Capillary: 108 mg/dL — ABNORMAL HIGH (ref 70–99)
Glucose-Capillary: 107 mg/dL — ABNORMAL HIGH (ref 70–99)
Glucose-Capillary: 172 mg/dL — ABNORMAL HIGH (ref 70–99)
Glucose-Capillary: 155 mg/dL — ABNORMAL HIGH (ref 70–99)

## 2020-01-11 LAB — AMMONIA: Ammonia: 35 umol/L (ref 9–35)

## 2020-01-11 MED ORDER — SORBITOL 70 % SOLN
960.0000 mL | TOPICAL_OIL | Freq: Once | ORAL | Status: DC
  Filled 2020-01-11: qty 473

## 2020-01-11 MED ORDER — MINERAL OIL RE ENEM
1.0000 | ENEMA | Freq: Once | RECTAL | Status: AC
  Administered 2020-01-11: 1 via RECTAL
  Filled 2020-01-11: qty 1

## 2020-01-11 NOTE — Progress Notes (Signed)
PROGRESS NOTE    [name]  KNL:[id number] DOB: January 06, 1961 DOA: 12/31/2019 PCP: [name], NP   Brief Narrative: [name] is a 59 y.o. malewith medical history significant forhypertension, poorly controlled diabetes mellitus, chronic leukopenia, history of CVA, and history of substance abuse. Patient presented secondary to body aches with evidence of mild rhabdomyolysis and volume overload, found to have nephrotic syndrome and AKI. Started on IV diuresis.   Assessment & Plan:   Principal Problem:   Generalized body aches Active Problems:   Hypertension   Substance-related disorder (HCC)   Hypokalemia   Non-traumatic rhabdomyolysis   Grade I diastolic dysfunction   Anemia   Hypokalemia due to loss of potassium   Nephrotic syndrome   Anasarca   Nephrotic syndrome Concern this is secondary to patient's underlying diabetes mellitus. Nephrology consulted and managing. Urinalysis significant for large protein/glucose with a urine protein/creatinine of 3.51. Serum albumin of around 2. Albumin IV given. UOP of 1.15 L in the last 24 hours with two unmeasured occurrences. Weight on admission of 127.7 kg. Weight of 121.8 kg on 11/15 -Nephrology recommendations: Lasix IV infusion -Daily weights/ strict in out  Abdominal distension Significant. Associated abdominal pain. Patient states he is not passing gas. Multiple episodes of watery stool. Decreased bowel sounds. No obstruction or volvulus on CT abdomen (11/10). Possibly some colitis, but if so, mild. No leukocytosis. CT scan also with urinary distension. GI pathogen panel negative. Repeat x-ray with ileus vs distal obstruction. Improved slightly after foley placement. Suppository on 11/15 with solid stool output -Mineral oil enema if patient will allow  AKI on CKD stage III AKI secondary to hypertensive emergency/ACE inhibitor use. Baseline creatinine of about 1.1. Peak creatinine of 1.42. benazepril held. Back  towards baseline initially with acute worsening. 2.31 today in setting of diuresis and urinary retention. No hydronephrosis on renal ultrasound. Possibly some associated ATN. -Per Nephrology  Chronic diastolic heart dysfunction Stable.  Primary hypertension Hypertensive emergency on admission. Blood pressure better controlled now but still elevated at times. -Continue atenolol and diltiazem  Anemia Normocytic. Anemia panel suggests possible mixed picture. Stable. No evidence of bleed. In setting of diarrhea as well as chronic kidney disease. -FOBT ordered but still pending  Hypokalemia Improved with supplementation -BMP daily  Diabetes mellitus, type 2 Uncontrolled with hyperglycemia and hypoglycemia Patient is on Lantus 40 units qhs and Januemet as an outpatient. -Continue Lantus 20 units daily and continue SSI  Bicuspid aortic valve Moderate aortic valve stenosis Associated LVH without evidence of LV systolic dysfunction. No LV dilation. -Cardiology follow-up outpatient  Urinary retention Bladder scan on 11/11 was significant for 668mL+ of retained urine. Continued urinary retention and patient continued to refuse foley placement until 11/14. Foley placed successfully on 11/14 -Continue to monitor UOP  Chest pain Likely reflux related but patient does have evidence of cholelithiasis with stone located in the neck of the gallbladder. Chest pain resolved -Continue Protonix  Lethargy Waxing and waning. Unknown etiology. -Ammonia, ABG   DVT prophylaxis: Lovenox Code Status:   Code Status: Full Code Family Communication: None at bedside Disposition Plan: Discharge to SNF pending nephrology recommendations in addition to improvement of diarrhea. Likely several days   Consultants:   Nephrology  Procedures:   TRANSTHORACIC ECHOCARDIOGRAM (01/01/2020) IMPRESSIONS    1. Left ventricular ejection fraction, by estimation, is 65 to 70%. The  left ventricle has normal  function. The left ventricle has no regional  wall motion abnormalities. There is moderate left ventricular  hypertrophy.  Hyperdense myocardium, consider  hypertensive cardiomyopathy versus infilrative cardiomyopathy. Left  ventricular diastolic parameters are consistent with Grade I diastolic  dysfunction (impaired relaxation).  2. Right ventricular systolic function is normal. The right ventricular  size is normal. There is mildly elevated pulmonary artery systolic  pressure. The estimated right ventricular systolic pressure is 60.7 mmHg.  3. Left atrial size was mildly dilated.  4. A small pericardial effusion is present. The pericardial effusion is  circumferential.  5. The mitral valve is grossly normal. Mild mitral valve regurgitation.  6. The aortic valve is functionally bicuspid. There is moderate  calcification of the aortic valve. Aortic valve regurgitation is trivial.  Moderate aortic valve stenosis. Aortic valve mean gradient measures 13.0  mmHg. Aortic valve Vmax measures 2.37 m/s.  Dimentionless index 0.32.  7. The inferior vena cava is dilated in size with <50% respiratory  variability, suggesting right atrial pressure of 15 mmHg.   Antimicrobials:  None    Subjective: No concerns today. No abdominal pain.  Objective: Vitals:   01/10/20 1433 01/10/20 2100 01/11/20 0500 01/11/20 1003  BP: 111/74 130/79 115/89 132/78  Pulse: 73 68 72 71  Resp: 20 18 18    Temp: (!) 97.5 F (36.4 C) 97.9 F (36.6 C) 98 F (36.7 C)   TempSrc: Oral Oral Oral   SpO2: 97% 98% 100%   Weight:      Height:        Intake/Output Summary (Last 24 hours) at 01/11/2020 1014 Last data filed at 01/11/2020 0815 Gross per 24 hour  Intake 270.54 ml  Output 1375 ml  Net -1104.46 ml   Filed Weights   01/06/20 0334 01/08/20 0500 01/10/20 0407  Weight: 135 kg 134.2 kg 121.8 kg    Examination:  General exam: Appears calm and comfortable Respiratory system: Clear to  auscultation. Respiratory effort normal. Cardiovascular system: S1 & S2 heard, RRR. No murmurs, rubs, gallops or clicks. Gastrointestinal system: Abdomen is moderately distended, soft and nontender. Epigastric/LUQ mass. Decreased bowel sounds heard. Central nervous system: Somnolent but arouses to voice. Oriented to self.  Musculoskeletal: 2+ BLE edema. No calf tenderness Skin: No cyanosis. No rashes Psychiatry: Judgement and insight appear impaired. Mood & affect appropriate.     Data Reviewed: I have personally reviewed following labs and imaging studies  CBC Lab Results  Component Value Date   WBC 3.5 (L) 01/11/2020   RBC 3.58 (L) 01/11/2020   HGB 9.1 (L) 01/11/2020   HCT 29.0 (L) 01/11/2020   MCV 81.0 01/11/2020   MCH 25.4 (L) 01/11/2020   PLT 285 01/11/2020   MCHC 31.4 01/11/2020   RDW 15.0 01/11/2020   LYMPHSABS 1.2 01/04/2020   MONOABS 0.5 01/04/2020   EOSABS 0.2 01/04/2020   BASOSABS 0.0 37/11/6267     Last metabolic panel Lab Results  Component Value Date   NA 135 01/11/2020   K 4.0 01/11/2020   CL 103 01/11/2020   CO2 23 01/11/2020   BUN 37 (H) 01/11/2020   CREATININE 2.11 (H) 01/11/2020   GLUCOSE 114 (H) 01/11/2020   GFRNONAA 36 (L) 01/11/2020   GFRAA >60 01/29/2018   CALCIUM 8.9 01/11/2020   PHOS 5.2 (H) 01/09/2020   PROT 6.9 01/09/2020   ALBUMIN 2.9 (L) 01/09/2020   BILITOT 0.6 01/09/2020   ALKPHOS 94 01/09/2020   AST 49 (H) 01/09/2020   ALT 35 01/09/2020   ANIONGAP 9 01/11/2020    CBG (last 3)  Recent Labs    01/10/20 1620 01/10/20 2121  01/11/20 0814  GLUCAP 92 137* 108*     GFR: Estimated Creatinine Clearance: 48.5 mL/min (A) (by C-G formula based on SCr of 2.11 mg/dL (H)).  Coagulation Profile: No results for input(s): INR, PROTIME in the last 168 hours.  Recent Results (from the past 240 hour(s))  SARS Coronavirus 2 by RT PCR (hospital order, performed in [facility] hospital lab) Nasopharyngeal Nasopharyngeal Swab     Status:  None   Collection Time: 01/03/20  5:09 PM   Specimen: Nasopharyngeal Swab  Result Value Ref Range Status   SARS Coronavirus 2 NEGATIVE NEGATIVE Final    Comment: (NOTE) SARS-CoV-2 target nucleic acids are NOT DETECTED.  The SARS-CoV-2 RNA is generally detectable in upper and lower respiratory specimens during the acute phase of infection. The lowest concentration of SARS-CoV-2 viral copies this assay can detect is 250 copies / mL. A negative result does not preclude SARS-CoV-2 infection and should not be used as the sole basis for treatment or other patient management decisions.  A negative result may occur with improper specimen collection / handling, submission of specimen other than nasopharyngeal swab, presence of viral mutation(s) within the areas targeted by this assay, and inadequate number of viral copies (<250 copies / mL). A negative result must be combined with clinical observations, patient history, and epidemiological information.  Fact Sheet for Patients:   StrictlyIdeas.no  Fact Sheet for Healthcare Providers: BankingDealers.co.za  This test is not yet approved or  cleared by the [place] FDA and has been authorized for detection and/or diagnosis of SARS-CoV-2 by FDA under an Emergency Use Authorization (EUA).  This EUA will remain in effect (meaning this test can be used) for the duration of the COVID-19 declaration under Section 564(b)(1) of the Act, 21 U.S.C. section 360bbb-3(b)(1), unless the authorization is terminated or revoked sooner.  Performed at [facility], [place] [address] [place], [place] [address]   Gastrointestinal Panel by PCR , Stool     Status: None   Collection Time: 01/06/20 12:26 PM   Specimen: Stool  Result Value Ref Range Status   Campylobacter species NOT DETECTED NOT DETECTED Final   Plesimonas shigelloides NOT DETECTED NOT DETECTED Final   Salmonella species NOT DETECTED NOT  DETECTED Final   Yersinia enterocolitica NOT DETECTED NOT DETECTED Final   Vibrio species NOT DETECTED NOT DETECTED Final   Vibrio cholerae NOT DETECTED NOT DETECTED Final   Enteroaggregative E coli (EAEC) NOT DETECTED NOT DETECTED Final   Enteropathogenic E coli (EPEC) NOT DETECTED NOT DETECTED Final   Enterotoxigenic E coli (ETEC) NOT DETECTED NOT DETECTED Final   Shiga like toxin producing E coli (STEC) NOT DETECTED NOT DETECTED Final   Shigella/Enteroinvasive E coli (EIEC) NOT DETECTED NOT DETECTED Final   Cryptosporidium NOT DETECTED NOT DETECTED Final   Cyclospora cayetanensis NOT DETECTED NOT DETECTED Final   Entamoeba histolytica NOT DETECTED NOT DETECTED Final   Giardia lamblia NOT DETECTED NOT DETECTED Final   Adenovirus F40/41 NOT DETECTED NOT DETECTED Final   Astrovirus NOT DETECTED NOT DETECTED Final   Norovirus GI/GII NOT DETECTED NOT DETECTED Final   Rotavirus A NOT DETECTED NOT DETECTED Final   Sapovirus (I, II, IV, and V) NOT DETECTED NOT DETECTED Final    Comment: Performed at [facility], [address] [place], [place] [address]  Culture, Urine     Status: None   Collection Time: 01/09/20  3:45 PM   Specimen: Urine, Catheterized  Result Value Ref Range Status  Specimen Description URINE, CATHETERIZED  Final   Special Requests NONE  Final   Culture   Final    NO GROWTH Performed at [facility], [address], [place], [place] [address]    Report Status 01/10/2020 FINAL  Final        Radiology Studies: DG Abd 1 View  Result Date: 01/09/2020 CLINICAL DATA:  Abdominal distension and pain EXAM: ABDOMEN - 1 VIEW COMPARISON:  January 05, 2020 CT abdomen and pelvis and abdominal radiograph FINDINGS: There is colonic dilatation throughout much of the colon. No appreciable small bowel dilatation. No air-fluid levels. No free air evident on supine examination. Small pelvic calcifications most likely represent phleboliths. IMPRESSION: Suspect  colonic ileus. Distal colonic obstruction potentially could present in this manner. No distal colonic obstruction is seen on recent CT. No appreciable small bowel dilatation. No air-fluid levels. No evident free air on supine examination Electronically Signed   By: Lowella Grip III M.D.   On: 01/09/2020 13:36   US RENAL  Result Date: 01/09/2020 CLINICAL DATA:  Recent acute kidney injury with abdominal pain EXAM: RENAL / URINARY TRACT ULTRASOUND COMPLETE COMPARISON:  Renal ultrasound January 04, 2020; CT abdomen and pelvis January 05, 2020 FINDINGS: Right Kidney: Renal measurements: 10.6 x 6.3 x 5.2 cm = volume: 183 mL. Echogenicity and renal cortical thickness are within normal limits. No mass, perinephric fluid, or hydronephrosis visualized. No sonographically demonstrable calculus or ureterectasis. Left Kidney: Renal measurements: 12.4 x 6.0 x 5.3 cm = volume: 207 mL. Echogenicity and renal cortical thickness are within normal limits. No mass, perinephric fluid, or hydronephrosis visualized. No sonographically demonstrable calculus or ureterectasis. Bladder: Urinary bladder again appears somewhat distended with a measured urinary bladder volume of 497 mL. Urinary bladder wall thickness appears normal. Other: None. IMPRESSION: Persistent relative distension of the urinary bladder, raising concern for a degree of bladder outlet obstruction. Study otherwise unremarkable. Electronically Signed   By: Lowella Grip III M.D.   On: 01/09/2020 13:34        Scheduled Meds: . atenolol  25 mg Oral Daily  . Chlorhexidine Gluconate Cloth  6 each Topical Daily  . diltiazem  180 mg Oral Daily  . enoxaparin (LOVENOX) injection  65 mg Subcutaneous Daily  . gabapentin  800 mg Oral BID  . insulin aspart  0-5 Units Subcutaneous QHS  . insulin aspart  0-9 Units Subcutaneous TID WC  . insulin glargine  20 Units Subcutaneous Daily  . oxyCODONE-acetaminophen  1 tablet Oral Once  . pantoprazole  40 mg Oral  Daily  . sodium chloride flush  3 mL Intravenous Q12H  . sodium chloride flush  3 mL Intravenous Q12H  . tamsulosin  0.4 mg Oral Daily   Continuous Infusions: . sodium chloride    . furosemide (LASIX) 200 mg in dextrose 5% 100 mL (2mg /mL) infusion 10 mg/hr (01/11/20 0425)     LOS: 7 days     Cordelia Poche, MD [facility] 01/11/2020, 10:14 AM  If 7PM-7AM, please contact night-coverage [email or web address]

## 2020-01-11 NOTE — Progress Notes (Signed)
Physical Therapy Treatment Patient Details Name: [name] MRN: [id number] DOB: [birth year] Today's Date: 01/11/2020    History of Present Illness 59 y.o. male with medical history significant for hypertension, poorly controlled diabetes mellitus, chronic leukopenia, history of CVA, and history of substance abuse, now presenting to the emergency department for evaluation of aches. He has not taken any of his medicines in a couple days at least, and has developed cramping pain mainly in his legs. Pt found to be hypertensive and hypokalemic.    PT Comments    Pt supine in bed on arrival.  Pt perseverating on wanting his food as his first tray had been dumped in the floor earlier.  Plan for SNF remains appropriate as he continues to require mod +2/max +2 to mobilize with use of sara stedy and external assistance.  Pt limited due to edema in B LEs which limits ROM in his joints and causes increased pain.    Follow Up Recommendations  SNF     Equipment Recommendations  Hospital bed;Other (comment) (hoyer lift and pad.)    Recommendations for Other Services       Precautions / Restrictions Precautions Precautions: Fall Precaution Comments: Increased edema limits ROM of joints Restrictions Weight Bearing Restrictions: No    Mobility  Bed Mobility Overal bed mobility: Needs Assistance Bed Mobility: Supine to Sit       Sit to supine: Mod assist;+2 for physical assistance   General bed mobility comments: PTA provided assistance to move B LEs to edge of bed and elevate trunk into a seated position.  Pt reaching for mobility tech's hand to rise trunk into sitting.  PTA used bed pad to scoot hips to edge of bed.  Transfers Overall transfer level: Needs assistance Equipment used: 2 person hand held assist;Ambulation equipment used (1st trial with 2 person HHA, Unable to elevate trunk and extend hips.  Performed 2nd trial with sara stedy.) Transfers: Sit to/from Stand Sit to Stand:  Mod assist;+2 physical assistance (heavy mod +2 from elevated surface height.) Stand pivot transfers:  (Utilized sara stedy on wheels to turn patient from surface to surface.)       General transfer comment: Pt performed sit to stand transfer with 1st attempt with B HHA.  He could clear hip but unable to extend hip, trunk and head into standing.  Performed additional sit to stand with sara stedy.  Pt required assistance to facilitate hips into standing.  Pt with increased pain returning to seated surface as this caused increased flexion in knees that was painful due to excessive edema. Increased time to pull stedy out to reduce knee flexion during eccentric load.  Ambulation/Gait Ambulation/Gait assistance:  (NT- Unable.  Functional transfers remains taxing due to limited ROM.)               Stairs             Wheelchair Mobility    Modified Rankin (Stroke Patients Only)       Balance Overall balance assessment: Needs assistance   Sitting balance-Leahy Scale: Fair Sitting balance - Comments: fair after stretching to get his hips flexed enough to stay in sitting     Standing balance-Leahy Scale: Poor Standing balance comment: BUE support and external assistance to come to standing.                            Cognition Arousal/Alertness: Awake/alert Behavior During Therapy: Impulsive;Anxious;Agitated Overall Cognitive Status:  No family/caregiver present to determine baseline cognitive functioning                                 General Comments: Pt required cues for safety and step by step commands to mobilize.      Exercises      General Comments        Pertinent Vitals/Pain Pain Assessment: Faces Faces Pain Scale: Hurts even more Pain Location: bilateral knees and back ( increased pain descending to stndard seat height due to decreased ROM in B knees. Pain Descriptors / Indicators: Guarding Pain Intervention(s): Monitored during  session;Repositioned    Home Living                      Prior Function            PT Goals (current goals can now be found in the care plan section) Acute Rehab PT Goals Patient Stated Goal: to start walking Potential to Achieve Goals: Fair Additional Goals Additional Goal #1: Pt will mobilize in power wheelchair for 100' around obstacles at a modI level Progress towards PT goals: Progressing toward goals    Frequency    Min 2X/week      PT Plan Current plan remains appropriate    Co-evaluation              AM-PAC PT "6 Clicks" Mobility   Outcome Measure  Help needed turning from your back to your side while in a flat bed without using bedrails?: A Lot Help needed moving from lying on your back to sitting on the side of a flat bed without using bedrails?: A Lot Help needed moving to and from a bed to a chair (including a wheelchair)?: A Lot Help needed standing up from a chair using your arms (e.g., wheelchair or bedside chair)?: Total Help needed to walk in hospital room?: Total Help needed climbing 3-5 steps with a railing? : Total 6 Click Score: 9    End of Session Equipment Utilized During Treatment: Gait belt Activity Tolerance: Patient tolerated treatment well Patient left: in bed;with call bell/phone within reach;with bed alarm set;with nursing/sitter in room Nurse Communication: Mobility status;Need for lift equipment (use of sara stedy) PT Visit Diagnosis: Other abnormalities of gait and mobility (R26.89);Muscle weakness (generalized) (M62.81);Difficulty in walking, not elsewhere classified (R26.2)     Time: 8309-4076 PT Time Calculation (min) (ACUTE ONLY): 25 min  Charges:  $Therapeutic Activity: 23-37 mins                     Erasmo Leventhal , PTA Acute Rehabilitation Services Pager [phone] Office [phone]     Shatona Andujar Eli Hose 01/11/2020, 3:34 PM

## 2020-01-11 NOTE — Progress Notes (Signed)
Physical Therapy Treatment Patient Details Name: RUVEN CORRADI MRN: [id number] DOB: 18-Jun-1960 Today's Date: 01/11/2020    History of Present Illness 59 y.o. male with medical history significant for hypertension, poorly controlled diabetes mellitus, chronic leukopenia, history of CVA, and history of substance abuse, now presenting to the emergency department for evaluation of aches. He has not taken any of his medicines in a couple days at least, and has developed cramping pain mainly in his legs. Pt found to be hypertensive and hypokalemic.    PT Comments    PTA returned back to assist patient back to bed as nursing unsuccessful on arrival.  He required cues to position to edge of chair and bed pad used to boost hips into standing.  Increased heavy max assistance to return to bed.  He will definitely require use of maximove pad for back to bed transfers as chair is much lower than bed height.  Continue to recommend snf placement.     Follow Up Recommendations  SNF     Equipment Recommendations  Hospital bed;Other (comment) (hoyer lift and pad)    Recommendations for Other Services       Precautions / Restrictions Precautions Precautions: Fall Precaution Comments: Increased edema limits ROM of joints Restrictions Weight Bearing Restrictions: No    Mobility  Bed Mobility Overal bed mobility: Needs Assistance Bed Mobility: Sit to Supine       Sit to supine: Total assist (+2 to lift B LEs and lower trunk.)   General bed mobility comments: PTA provided cues to lower head to pillow and assist given to B LEs for back to bed and to lower his trunk.  Transfers Overall transfer level: Needs assistance Equipment used: Ambulation equipment used (sara stedy.) Transfers: Sit to/from Stand Sit to Stand: Total assist;Max assist (+4) Stand pivot transfers:  (Utilized sara stedy on wheels to turn patient from surface to surface.)       General transfer comment: Pt required cues to  scoot to edge of recliner to prepare for sit to stand transfer.  He was limited once again by ROM and pain.  Pt unable to achieve standing with +2 assistance.  Required +4 to rise in standing.  Significant difficulty noted from lower seated surface.  Ambulation/Gait Ambulation/Gait assistance:  (NT- Unable.  Functional transfers remains taxing due to limited ROM.)               Stairs             Wheelchair Mobility    Modified Rankin (Stroke Patients Only)       Balance Overall balance assessment: Needs assistance Sitting-balance support: Feet supported Sitting balance-Leahy Scale: Fair Sitting balance - Comments: fair after stretching to get his hips flexed enough to stay in sitting     Standing balance-Leahy Scale: Poor Standing balance comment: heavy external assistance to come to standing and return back to bed.                            Cognition Arousal/Alertness: Awake/alert Behavior During Therapy: Impulsive;Anxious;Agitated Overall Cognitive Status: No family/caregiver present to determine baseline cognitive functioning                                 General Comments: Pt has difficulty with active listening.  He requires failed attempts to follow commands to achieve mobility tasks.  Exercises      General Comments        Pertinent Vitals/Pain Pain Assessment: Faces Faces Pain Scale: Hurts even more Pain Location: bilateral knees and back ( increased pain descending to stndard seat height due to decreased ROM in B knees. Pain Descriptors / Indicators: Guarding Pain Intervention(s): Monitored during session;Repositioned    Home Living                      Prior Function            PT Goals (current goals can now be found in the care plan section) Acute Rehab PT Goals Patient Stated Goal: to get back to bed. Potential to Achieve Goals: Fair Additional Goals Additional Goal #1: Pt will mobilize in  power wheelchair for 100' around obstacles at a modI level Progress towards PT goals: Progressing toward goals    Frequency    Min 2X/week      PT Plan Current plan remains appropriate    Co-evaluation              AM-PAC PT "6 Clicks" Mobility   Outcome Measure  Help needed turning from your back to your side while in a flat bed without using bedrails?: Total Help needed moving from lying on your back to sitting on the side of a flat bed without using bedrails?: Total Help needed moving to and from a bed to a chair (including a wheelchair)?: Total Help needed standing up from a chair using your arms (e.g., wheelchair or bedside chair)?: Total Help needed to walk in hospital room?: Total Help needed climbing 3-5 steps with a railing? : Total 6 Click Score: 6    End of Session Equipment Utilized During Treatment: Gait belt Activity Tolerance: Patient tolerated treatment well Patient left: in bed;with call bell/phone within reach;with bed alarm set;with nursing/sitter in room Nurse Communication: Mobility status;Need for lift equipment (will require maximove for back to bed transfer based on increased difficulty to return back to bed.) PT Visit Diagnosis: Other abnormalities of gait and mobility (R26.89);Muscle weakness (generalized) (M62.81);Difficulty in walking, not elsewhere classified (R26.2)     Time: 4163-8453 PT Time Calculation (min) (ACUTE ONLY): 20 min  Charges:  $Therapeutic Activity: 8-22 mins                     Erasmo Leventhal , PTA Acute Rehabilitation Services Pager [phone] Office [phone]     Jiyan Walkowski Eli Hose 01/11/2020, 5:39 PM

## 2020-01-11 NOTE — Progress Notes (Signed)
[facility] ROUNDING NOTE   Subjective:   Brief history  59 year old gentleman with history of hypertension poorly controlled diabetes neuropathy retinopathy hemoglobin A1c 10.6 history of CVA history of congestive heart failure admitted 12/31/2019 with hypertensive emergency hyperkalemia proteinuria and elevated creatinine.  Baseline creatinine about 1.1 mg/dL.  Has nephrotic range proteinuria secondary diabetic nephropathy.  He has an unremarkable serological evaluation.  Including hepatitis B hepatitis C HIV ANA and kappa lambda light chain ratio.  Renal ultrasound showed no obstruction.  He was started on IV Lasix now has rising creatinine.  He is refusing Foley catheter we are holding his ACE inhibitor and ARB.  Repeat ultrasound shows relative distention of urinary bladder.  Sodium 135 potassium 4 chloride 103 CO2 23 BUN 37 creatinine 2.1 glucose 114 calcium 8.9 hemoglobin 9.1  Blood pressure 115/89 pulse 70 temperature 98 O2 sats 97% room air  Urine output 2.5 L 01/10/2020   Objective:  Vital signs in last 24 hours:  Temp:  [97.5 F (36.4 C)-98 F (36.7 C)] 98 F (36.7 C) (11/16 0500) Pulse Rate:  [68-73] 72 (11/16 0500) Resp:  [18-20] 18 (11/16 0500) BP: (111-130)/(74-89) 115/89 (11/16 0500) SpO2:  [97 %-100 %] 100 % (11/16 0500)  Weight change:  Filed Weights   01/06/20 0334 01/08/20 0500 01/10/20 0407  Weight: 135 kg 134.2 kg 121.8 kg    Intake/Output: I/O last 3 completed shifts: In: 510.5 [P.O.:477; I.V.:33.5] Out: 2535 [Urine:2535]   Intake/Output this shift:  No intake/output data recorded.  General:NAD, comfortable Heart:RRR, s1s2 nl Lungs:clear b/l, no crackle Abdomen:soft, Non-tender, distended. Extremities: Bilateral lower extremities pitting edema++, not much improvement Neurology: Alert awake and following commands  Basic Metabolic Panel: Recent Labs  Lab 01/06/20 0356 01/06/20 0356 01/07/20 0503 01/07/20 0503 01/08/20 5284  01/08/20 0208 01/09/20 0321 01/09/20 0321 01/09/20 1027 01/10/20 0409 01/11/20 0239  NA 135   < > 137   < > 136  --  135  --  134* 135 135  K 3.3*   < > 3.2*   < > 3.8  --  4.2  --  4.6 4.5 4.0  CL 103   < > 103   < > 104  --  102  --  100 104 103  CO2 24   < > 26   < > 21*  --  24  --  22 22 23   GLUCOSE 84   < > 113*   < > 96  --  101*  --  100* 103* 114*  BUN 17   < > 18   < > 22*  --  28*  --  30* 33* 37*  CREATININE 1.17   < > 1.27*   < > 1.56*  --  1.94*  --  2.18* 2.31* 2.11*  CALCIUM 8.8*   < > 9.0   < > 8.9   < > 9.2   < > 9.0 9.1 8.9  MG  --   --   --   --  1.9  --   --   --  2.0  --   --   PHOS 3.7  --  4.2  --  4.9*  --  5.2*  --   --   --   --    < > = values in this interval not displayed.    Liver Function Tests: Recent Labs  Lab 01/05/20 0230 01/05/20 0230 01/06/20 0356 01/07/20 0503 01/08/20 0208 01/09/20 0321 01/09/20 1027  AST 19  --   --   --   --   --  49*  ALT 13  --   --   --   --   --  35  ALKPHOS 74  --   --   --   --   --  94  BILITOT 0.5  --   --   --   --   --  0.6  PROT 5.5*  --   --   --   --   --  6.9  ALBUMIN 2.1*   < > 2.8* 2.8* 2.7* 2.9* 2.9*   < > = values in this interval not displayed.   No results for input(s): LIPASE, AMYLASE in the last 168 hours. No results for input(s): AMMONIA in the last 168 hours.  CBC: Recent Labs  Lab 01/06/20 0928 01/11/20 0239  WBC 3.0* 3.5*  HGB 9.2* 9.1*  HCT 29.3* 29.0*  MCV 81.8 81.0  PLT 276 285    Cardiac Enzymes: No results for input(s): CKTOTAL, CKMB, CKMBINDEX, TROPONINI in the last 168 hours.  BNP: Invalid input(s): POCBNP  CBG: Recent Labs  Lab 01/09/20 2138 01/10/20 0804 01/10/20 1123 01/10/20 1620 01/10/20 2121  GLUCAP 92 112* 96 39 137*    Microbiology: Results for orders placed or performed during the hospital encounter of 12/31/19  Respiratory Panel by RT PCR (Flu A&B, Covid) - Nasopharyngeal Swab     Status: None   Collection Time: 12/31/19  2:00 PM   Specimen:  Nasopharyngeal Swab  Result Value Ref Range Status   SARS Coronavirus 2 by RT PCR NEGATIVE NEGATIVE Final    Comment: (NOTE) SARS-CoV-2 target nucleic acids are NOT DETECTED.  The SARS-CoV-2 RNA is generally detectable in upper respiratoy specimens during the acute phase of infection. The lowest concentration of SARS-CoV-2 viral copies this assay can detect is 131 copies/mL. A negative result does not preclude SARS-Cov-2 infection and should not be used as the sole basis for treatment or other patient management decisions. A negative result may occur with  improper specimen collection/handling, submission of specimen other than nasopharyngeal swab, presence of viral mutation(s) within the areas targeted by this assay, and inadequate number of viral copies (<131 copies/mL). A negative result must be combined with clinical observations, patient history, and epidemiological information. The expected result is Negative.  Fact Sheet for Patients:  PinkCheek.be  Fact Sheet for Healthcare Providers:  GravelBags.it  This test is no t yet approved or cleared by the [place] FDA and  has been authorized for detection and/or diagnosis of SARS-CoV-2 by FDA under an Emergency Use Authorization (EUA). This EUA will remain  in effect (meaning this test can be used) for the duration of the COVID-19 declaration under Section 564(b)(1) of the Act, 21 U.S.C. section 360bbb-3(b)(1), unless the authorization is terminated or revoked sooner.     Influenza A by PCR NEGATIVE NEGATIVE Final   Influenza B by PCR NEGATIVE NEGATIVE Final    Comment: (NOTE) The Xpert Xpress SARS-CoV-2/FLU/RSV assay is intended as an aid in  the diagnosis of influenza from Nasopharyngeal swab specimens and  should not be used as a sole basis for treatment. Nasal washings and  aspirates are unacceptable for Xpert Xpress SARS-CoV-2/FLU/RSV  testing.  Fact Sheet  for Patients: PinkCheek.be  Fact Sheet for Healthcare Providers: GravelBags.it  This test is not yet approved or cleared by the [place] FDA and  has been authorized for detection and/or diagnosis of SARS-CoV-2 by  FDA under an Emergency Use Authorization (EUA). This EUA will remain  in effect (meaning this  test can be used) for the duration of the  Covid-19 declaration under Section 564(b)(1) of the Act, 21  U.S.C. section 360bbb-3(b)(1), unless the authorization is  terminated or revoked. Performed at [facility], [place] [address] [place], [place] [address]   SARS Coronavirus 2 by RT PCR (hospital order, performed in [facility] hospital lab) Nasopharyngeal Nasopharyngeal Swab     Status: None   Collection Time: 01/03/20  5:09 PM   Specimen: Nasopharyngeal Swab  Result Value Ref Range Status   SARS Coronavirus 2 NEGATIVE NEGATIVE Final    Comment: (NOTE) SARS-CoV-2 target nucleic acids are NOT DETECTED.  The SARS-CoV-2 RNA is generally detectable in upper and lower respiratory specimens during the acute phase of infection. The lowest concentration of SARS-CoV-2 viral copies this assay can detect is 250 copies / mL. A negative result does not preclude SARS-CoV-2 infection and should not be used as the sole basis for treatment or other patient management decisions.  A negative result may occur with improper specimen collection / handling, submission of specimen other than nasopharyngeal swab, presence of viral mutation(s) within the areas targeted by this assay, and inadequate number of viral copies (<250 copies / mL). A negative result must be combined with clinical observations, patient history, and epidemiological information.  Fact Sheet for Patients:   StrictlyIdeas.no  Fact Sheet for Healthcare Providers: BankingDealers.co.za  This test is not yet approved  or  cleared by the [place] FDA and has been authorized for detection and/or diagnosis of SARS-CoV-2 by FDA under an Emergency Use Authorization (EUA).  This EUA will remain in effect (meaning this test can be used) for the duration of the COVID-19 declaration under Section 564(b)(1) of the Act, 21 U.S.C. section 360bbb-3(b)(1), unless the authorization is terminated or revoked sooner.  Performed at [facility], [place] [address], [place], [place] [address]   Gastrointestinal Panel by PCR , Stool     Status: None   Collection Time: 01/06/20 12:26 PM   Specimen: Stool  Result Value Ref Range Status   Campylobacter species NOT DETECTED NOT DETECTED Final   Plesimonas shigelloides NOT DETECTED NOT DETECTED Final   Salmonella species NOT DETECTED NOT DETECTED Final   Yersinia enterocolitica NOT DETECTED NOT DETECTED Final   Vibrio species NOT DETECTED NOT DETECTED Final   Vibrio cholerae NOT DETECTED NOT DETECTED Final   Enteroaggregative E coli (EAEC) NOT DETECTED NOT DETECTED Final   Enteropathogenic E coli (EPEC) NOT DETECTED NOT DETECTED Final   Enterotoxigenic E coli (ETEC) NOT DETECTED NOT DETECTED Final   Shiga like toxin producing E coli (STEC) NOT DETECTED NOT DETECTED Final   Shigella/Enteroinvasive E coli (EIEC) NOT DETECTED NOT DETECTED Final   Cryptosporidium NOT DETECTED NOT DETECTED Final   Cyclospora cayetanensis NOT DETECTED NOT DETECTED Final   Entamoeba histolytica NOT DETECTED NOT DETECTED Final   Giardia lamblia NOT DETECTED NOT DETECTED Final   Adenovirus F40/41 NOT DETECTED NOT DETECTED Final   Astrovirus NOT DETECTED NOT DETECTED Final   Norovirus GI/GII NOT DETECTED NOT DETECTED Final   Rotavirus A NOT DETECTED NOT DETECTED Final   Sapovirus (I, II, IV, and V) NOT DETECTED NOT DETECTED Final    Comment: Performed at [facility], [address] [place], [place] [address]  Culture, Urine     Status: None   Collection Time: 01/09/20  3:45 PM    Specimen: Urine, Catheterized  Result Value Ref Range Status   Specimen Description URINE, CATHETERIZED  Final   Special Requests NONE  Final   Culture   Final    NO GROWTH Performed at [facility], [place] [address] [place], [place] [address]    Report Status 01/10/2020 FINAL  Final    Coagulation Studies: No results for input(s): LABPROT, INR in the last 72 hours.  Urinalysis: Recent Labs    01/09/20 1545  COLORURINE AMBER*  LABSPEC 1.018  PHURINE 5.0  GLUCOSEU 50*  HGBUR SMALL*  BILIRUBINUR NEGATIVE  KETONESUR NEGATIVE  PROTEINUR >=300*  NITRITE NEGATIVE  LEUKOCYTESUR NEGATIVE      Imaging: DG Abd 1 View  Result Date: 01/09/2020 CLINICAL DATA:  Abdominal distension and pain EXAM: ABDOMEN - 1 VIEW COMPARISON:  January 05, 2020 CT abdomen and pelvis and abdominal radiograph FINDINGS: There is colonic dilatation throughout much of the colon. No appreciable small bowel dilatation. No air-fluid levels. No free air evident on supine examination. Small pelvic calcifications most likely represent phleboliths. IMPRESSION: Suspect colonic ileus. Distal colonic obstruction potentially could present in this manner. No distal colonic obstruction is seen on recent CT. No appreciable small bowel dilatation. No air-fluid levels. No evident free air on supine examination Electronically Signed   By: Lowella Grip III M.D.   On: 01/09/2020 13:36   US RENAL  Result Date: 01/09/2020 CLINICAL DATA:  Recent acute kidney injury with abdominal pain EXAM: RENAL / URINARY TRACT ULTRASOUND COMPLETE COMPARISON:  Renal ultrasound January 04, 2020; CT abdomen and pelvis January 05, 2020 FINDINGS: Right Kidney: Renal measurements: 10.6 x 6.3 x 5.2 cm = volume: 183 mL. Echogenicity and renal cortical thickness are within normal limits. No mass, perinephric fluid, or hydronephrosis visualized. No sonographically demonstrable calculus or ureterectasis. Left Kidney: Renal measurements: 12.4 x  6.0 x 5.3 cm = volume: 207 mL. Echogenicity and renal cortical thickness are within normal limits. No mass, perinephric fluid, or hydronephrosis visualized. No sonographically demonstrable calculus or ureterectasis. Bladder: Urinary bladder again appears somewhat distended with a measured urinary bladder volume of 497 mL. Urinary bladder wall thickness appears normal. Other: None. IMPRESSION: Persistent relative distension of the urinary bladder, raising concern for a degree of bladder outlet obstruction. Study otherwise unremarkable. Electronically Signed   By: Lowella Grip III M.D.   On: 01/09/2020 13:34     Medications:   . sodium chloride    . furosemide (LASIX) 200 mg in dextrose 5% 100 mL (2mg /mL) infusion 10 mg/hr (01/11/20 0425)   . atenolol  25 mg Oral Daily  . Chlorhexidine Gluconate Cloth  6 each Topical Daily  . diltiazem  180 mg Oral Daily  . enoxaparin (LOVENOX) injection  65 mg Subcutaneous Daily  . gabapentin  800 mg Oral BID  . insulin aspart  0-5 Units Subcutaneous QHS  . insulin aspart  0-9 Units Subcutaneous TID WC  . insulin glargine  20 Units Subcutaneous Daily  . oxyCODONE-acetaminophen  1 tablet Oral Once  . pantoprazole  40 mg Oral Daily  . sodium chloride flush  3 mL Intravenous Q12H  . sodium chloride flush  3 mL Intravenous Q12H  . tamsulosin  0.4 mg Oral Daily   sodium chloride, acetaminophen **OR** acetaminophen, hydrALAZINE, LORazepam, ondansetron **OR** ondansetron (ZOFRAN) IV, oxyCODONE-acetaminophen, polyethylene glycol, sodium chloride flush  Assessment/ Plan:  1.Nephrotic syndrome likely due to diabetic nephropathy: He has severe hypoalbuminemia associated with edema and urine PCR 3.5 g. Urinalysis has glucosuria and proteinuria without hematuria.  Hep B, hep C, HIV, ANA and kappa lambda ratio unremarkable. Kidney ultrasound  with normal cortical thickness and without obstruction. The bladder scan showed urinary retention and patient declined  insertion of Foley catheter.  He is still refusing Foley catheter. I will continue to hold ACE inhibitor or ARB while diuresing however this needs to be started after maintaining euvolemia.  Continues to have good urine output  2.Acute kidney injury due to hypertensive emergency and hemodynamic change related with ACE inhibitor. 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 Management as above. Patient appears to be having good response to IV diuretics. Need to rule out obstruction as discussed above. Avoid nephrotoxins including NSAIDs or IV contrast.  Probably has a degree of acute tubular necrosis.  Renal ultrasound did demonstrate bladder distention without hydronephrosis.  3.Bilateral lower extremity edema: diuretics as above.  4.Hypokalemia due to diuretics: Repleted potassium chloride. Monitor electrolytes especially with diuretics.   5.Hypertensive improved blood pressure control  6.anemia we will check iron studies   LOS: [place] @TODAY @7 :46 AM

## 2020-01-11 NOTE — TOC Progression Note (Addendum)
Transition of Care [facility]) - Progression Note    Patient Details  Name: [name] MRN: [id number] Date of Birth: [birth year]  Transition of Care [facility] CM/SW [place], [place] Phone Number: 01/11/2020, 11:37 AM  Clinical Narrative:   Update 11/16 4:43pm-Insurance start date is for 11/19. CSW submitted updated therapy notes to insurance for review.  CSW will continue to follow.    CSW restarted patients insurance. Reference number is #[id number]. Start date request is for tomorrow 11/17. Once insurance is approved need to request to change start date to 11/19. After speaking with MD patient maybe here another 3+ days.  Patient has a SNF bed at [facility]. Insurance authorization pending.  CSW will continue to follow.  Expected Discharge Plan: [place] Barriers to Discharge: Continued Medical Work up  Expected Discharge Plan and Services Expected Discharge Plan: [place] Choice: [place] arrangements for the past 2 months: Single Family Home                                       Social Determinants of Health (SDOH) Interventions    Readmission Risk Interventions No flowsheet data found.

## 2020-01-12 DIAGNOSIS — N1831 Chronic kidney disease, stage 3a: Secondary | ICD-10-CM | POA: Diagnosis not present

## 2020-01-12 DIAGNOSIS — N049 Nephrotic syndrome with unspecified morphologic changes: Secondary | ICD-10-CM | POA: Diagnosis not present

## 2020-01-12 DIAGNOSIS — N179 Acute kidney failure, unspecified: Secondary | ICD-10-CM

## 2020-01-12 DIAGNOSIS — I10 Essential (primary) hypertension: Secondary | ICD-10-CM | POA: Diagnosis not present

## 2020-01-12 LAB — GLUCOSE, CAPILLARY
Glucose-Capillary: 151 mg/dL — ABNORMAL HIGH (ref 70–99)
Glucose-Capillary: 163 mg/dL — ABNORMAL HIGH (ref 70–99)
Glucose-Capillary: 194 mg/dL — ABNORMAL HIGH (ref 70–99)

## 2020-01-12 MED ORDER — SODIUM CHLORIDE 0.9 % IV SOLN
100.0000 mg | INTRAVENOUS | Status: AC
  Administered 2020-01-12 – 2020-01-15 (×4): 100 mg via INTRAVENOUS
  Filled 2020-01-12 (×5): qty 5

## 2020-01-12 NOTE — TOC Progression Note (Signed)
Transition of Care [facility]) - Progression Note    Patient Details  Name: [name] MRN: [id number] Date of Birth: [birth year]  Transition of Care [facility]) CM/SW [place], [place] Phone Number: 01/12/2020, 8:40 AM  Clinical Narrative:     CSW received insurance authorization approval for patient. Insurance authorization approval start date is for 11/19 and the next review date is 11/23.Patient has SNF bed at [facility].  CSW will continue to follow.  Expected Discharge Plan: [place] Barriers to Discharge: Continued Medical Work up  Expected Discharge Plan and Services Expected Discharge Plan: [place] Choice: [place] arrangements for the past 2 months: Single Family Home                                       Social Determinants of Health (SDOH) Interventions    Readmission Risk Interventions No flowsheet data found.

## 2020-01-12 NOTE — Progress Notes (Signed)
[facility] ROUNDING NOTE   Subjective:   Brief history  59 year old gentleman with history of hypertension poorly controlled diabetes neuropathy retinopathy hemoglobin A1c 10.6 history of CVA history of congestive heart failure admitted 12/31/2019 with hypertensive emergency hyperkalemia proteinuria and elevated creatinine.  Baseline creatinine about 1.1 mg/dL.  Has nephrotic range proteinuria secondary diabetic nephropathy.  He has an unremarkable serological evaluation.  Including hepatitis B hepatitis C HIV ANA and kappa lambda light chain ratio.  Renal ultrasound showed no obstruction.  He was started on IV Lasix now has rising creatinine.  He is refusing Foley catheter we are holding his ACE inhibitor and ARB.  Repeat ultrasound shows relative distention of urinary bladder.  Labs pending 01/12/2019 last hemoglobin 9.1  Blood pressure 143/89 pulse 78 temperature 98.2 O2 sats 93% room air  Urine output 1.5 L 01/11/2020   Objective:  Vital signs in last 24 hours:  Temp:  [97.3 F (36.3 C)-98.2 F (36.8 C)] 98.2 F (36.8 C) (11/17 0500) Pulse Rate:  [71-78] 78 (11/17 0500) Resp:  [20] 20 (11/17 0500) BP: (132-155)/(78-92) 143/89 (11/17 0500) SpO2:  [95 %-98 %] 98 % (11/17 0500) Weight:  [126.1 kg] 126.1 kg (11/17 0500)  Weight change:  Filed Weights   01/08/20 0500 01/10/20 0407 01/12/20 0500  Weight: 134.2 kg 121.8 kg 126.1 kg    Intake/Output: I/O last 3 completed shifts: In: 335.4 [P.O.:237; I.V.:98.4] Out: 1525 [Urine:1525]   Intake/Output this shift:  No intake/output data recorded.  General:NAD, comfortable Heart:RRR, s1s2 nl Lungs:clear b/l, no crackle Abdomen:soft, Non-tender, distended. Extremities: Bilateral lower extremities pitting edema++, not much improvement Neurology: Alert awake and following commands  Basic Metabolic Panel: Recent Labs  Lab 01/06/20 0356 01/06/20 0356 01/07/20 0503 01/07/20 0503 01/08/20 2951 01/08/20 0208  01/09/20 0321 01/09/20 0321 01/09/20 1027 01/10/20 0409 01/11/20 0239  NA 135   < > 137   < > 136  --  135  --  134* 135 135  K 3.3*   < > 3.2*   < > 3.8  --  4.2  --  4.6 4.5 4.0  CL 103   < > 103   < > 104  --  102  --  100 104 103  CO2 24   < > 26   < > 21*  --  24  --  22 22 23   GLUCOSE 84   < > 113*   < > 96  --  101*  --  100* 103* 114*  BUN 17   < > 18   < > 22*  --  28*  --  30* 33* 37*  CREATININE 1.17   < > 1.27*   < > 1.56*  --  1.94*  --  2.18* 2.31* 2.11*  CALCIUM 8.8*   < > 9.0   < > 8.9   < > 9.2   < > 9.0 9.1 8.9  MG  --   --   --   --  1.9  --   --   --  2.0  --   --   PHOS 3.7  --  4.2  --  4.9*  --  5.2*  --   --   --   --    < > = values in this interval not displayed.    Liver Function Tests: Recent Labs  Lab 01/06/20 0356 01/07/20 0503 01/08/20 0208 01/09/20 0321 01/09/20 1027  AST  --   --   --   --  49*  ALT  --   --   --   --  35  ALKPHOS  --   --   --   --  94  BILITOT  --   --   --   --  0.6  PROT  --   --   --   --  6.9  ALBUMIN 2.8* 2.8* 2.7* 2.9* 2.9*   No results for input(s): LIPASE, AMYLASE in the last 168 hours. Recent Labs  Lab 01/11/20 1053  AMMONIA 35    CBC: Recent Labs  Lab 01/06/20 0928 01/11/20 0239  WBC 3.0* 3.5*  HGB 9.2* 9.1*  HCT 29.3* 29.0*  MCV 81.8 81.0  PLT 276 285    Cardiac Enzymes: No results for input(s): CKTOTAL, CKMB, CKMBINDEX, TROPONINI in the last 168 hours.  BNP: Invalid input(s): POCBNP  CBG: Recent Labs  Lab 01/10/20 2121 01/11/20 0814 01/11/20 1142 01/11/20 1704 01/11/20 2159  GLUCAP 137* 108* 107* 172* 155*    Microbiology: Results for orders placed or performed during the hospital encounter of 12/31/19  Respiratory Panel by RT PCR (Flu A&B, Covid) - Nasopharyngeal Swab     Status: None   Collection Time: 12/31/19  2:00 PM   Specimen: Nasopharyngeal Swab  Result Value Ref Range Status   SARS Coronavirus 2 by RT PCR NEGATIVE NEGATIVE Final    Comment: (NOTE) SARS-CoV-2 target  nucleic acids are NOT DETECTED.  The SARS-CoV-2 RNA is generally detectable in upper respiratoy specimens during the acute phase of infection. The lowest concentration of SARS-CoV-2 viral copies this assay can detect is 131 copies/mL. A negative result does not preclude SARS-Cov-2 infection and should not be used as the sole basis for treatment or other patient management decisions. A negative result may occur with  improper specimen collection/handling, submission of specimen other than nasopharyngeal swab, presence of viral mutation(s) within the areas targeted by this assay, and inadequate number of viral copies (<131 copies/mL). A negative result must be combined with clinical observations, patient history, and epidemiological information. The expected result is Negative.  Fact Sheet for Patients:  PinkCheek.be  Fact Sheet for Healthcare Providers:  GravelBags.it  This test is no t yet approved or cleared by the [place] FDA and  has been authorized for detection and/or diagnosis of SARS-CoV-2 by FDA under an Emergency Use Authorization (EUA). This EUA will remain  in effect (meaning this test can be used) for the duration of the COVID-19 declaration under Section 564(b)(1) of the Act, 21 U.S.C. section 360bbb-3(b)(1), unless the authorization is terminated or revoked sooner.     Influenza A by PCR NEGATIVE NEGATIVE Final   Influenza B by PCR NEGATIVE NEGATIVE Final    Comment: (NOTE) The Xpert Xpress SARS-CoV-2/FLU/RSV assay is intended as an aid in  the diagnosis of influenza from Nasopharyngeal swab specimens and  should not be used as a sole basis for treatment. Nasal washings and  aspirates are unacceptable for Xpert Xpress SARS-CoV-2/FLU/RSV  testing.  Fact Sheet for Patients: PinkCheek.be  Fact Sheet for Healthcare  Providers: GravelBags.it  This test is not yet approved or cleared by the [place] FDA and  has been authorized for detection and/or diagnosis of SARS-CoV-2 by  FDA under an Emergency Use Authorization (EUA). This EUA will remain  in effect (meaning this test can be used) for the duration of the  Covid-19 declaration under Section 564(b)(1) of the Act, 21  U.S.C. section 360bbb-3(b)(1), unless the authorization is  terminated or revoked.  Performed at [facility], [place] [address] [place], [place] [address]   SARS Coronavirus 2 by RT PCR (hospital order, performed in [facility] hospital lab) Nasopharyngeal Nasopharyngeal Swab     Status: None   Collection Time: 01/03/20  5:09 PM   Specimen: Nasopharyngeal Swab  Result Value Ref Range Status   SARS Coronavirus 2 NEGATIVE NEGATIVE Final    Comment: (NOTE) SARS-CoV-2 target nucleic acids are NOT DETECTED.  The SARS-CoV-2 RNA is generally detectable in upper and lower respiratory specimens during the acute phase of infection. The lowest concentration of SARS-CoV-2 viral copies this assay can detect is 250 copies / mL. A negative result does not preclude SARS-CoV-2 infection and should not be used as the sole basis for treatment or other patient management decisions.  A negative result may occur with improper specimen collection / handling, submission of specimen other than nasopharyngeal swab, presence of viral mutation(s) within the areas targeted by this assay, and inadequate number of viral copies (<250 copies / mL). A negative result must be combined with clinical observations, patient history, and epidemiological information.  Fact Sheet for Patients:   StrictlyIdeas.no  Fact Sheet for Healthcare Providers: BankingDealers.co.za  This test is not yet approved or  cleared by the [place] FDA and has been authorized for detection and/or  diagnosis of SARS-CoV-2 by FDA under an Emergency Use Authorization (EUA).  This EUA will remain in effect (meaning this test can be used) for the duration of the COVID-19 declaration under Section 564(b)(1) of the Act, 21 U.S.C. section 360bbb-3(b)(1), unless the authorization is terminated or revoked sooner.  Performed at [facility], [place] [address], [place], [place] [address]   Gastrointestinal Panel by PCR , Stool     Status: None   Collection Time: 01/06/20 12:26 PM   Specimen: Stool  Result Value Ref Range Status   Campylobacter species NOT DETECTED NOT DETECTED Final   Plesimonas shigelloides NOT DETECTED NOT DETECTED Final   Salmonella species NOT DETECTED NOT DETECTED Final   Yersinia enterocolitica NOT DETECTED NOT DETECTED Final   Vibrio species NOT DETECTED NOT DETECTED Final   Vibrio cholerae NOT DETECTED NOT DETECTED Final   Enteroaggregative E coli (EAEC) NOT DETECTED NOT DETECTED Final   Enteropathogenic E coli (EPEC) NOT DETECTED NOT DETECTED Final   Enterotoxigenic E coli (ETEC) NOT DETECTED NOT DETECTED Final   Shiga like toxin producing E coli (STEC) NOT DETECTED NOT DETECTED Final   Shigella/Enteroinvasive E coli (EIEC) NOT DETECTED NOT DETECTED Final   Cryptosporidium NOT DETECTED NOT DETECTED Final   Cyclospora cayetanensis NOT DETECTED NOT DETECTED Final   Entamoeba histolytica NOT DETECTED NOT DETECTED Final   Giardia lamblia NOT DETECTED NOT DETECTED Final   Adenovirus F40/41 NOT DETECTED NOT DETECTED Final   Astrovirus NOT DETECTED NOT DETECTED Final   Norovirus GI/GII NOT DETECTED NOT DETECTED Final   Rotavirus A NOT DETECTED NOT DETECTED Final   Sapovirus (I, II, IV, and V) NOT DETECTED NOT DETECTED Final    Comment: Performed at [facility], [address] [place], [place] [address]  Culture, Urine     Status: None   Collection Time: 01/09/20  3:45 PM   Specimen: Urine, Catheterized  Result Value Ref Range Status   Specimen  Description URINE, CATHETERIZED  Final   Special Requests NONE  Final   Culture   Final    NO GROWTH Performed at [facility], [address] [place], [place]  [address]    Report Status 01/10/2020 FINAL  Final    Coagulation Studies: No results for input(s): LABPROT, INR in the last 72 hours.  Urinalysis: Recent Labs    01/09/20 1545  COLORURINE AMBER*  LABSPEC 1.018  PHURINE 5.0  GLUCOSEU 50*  HGBUR SMALL*  BILIRUBINUR NEGATIVE  KETONESUR NEGATIVE  PROTEINUR >=300*  NITRITE NEGATIVE  LEUKOCYTESUR NEGATIVE      Imaging: No results found.   Medications:   . sodium chloride    . furosemide (LASIX) 200 mg in dextrose 5% 100 mL (2mg /mL) infusion 10 mg/hr (01/12/20 0320)   . atenolol  25 mg Oral Daily  . Chlorhexidine Gluconate Cloth  6 each Topical Daily  . diltiazem  180 mg Oral Daily  . enoxaparin (LOVENOX) injection  65 mg Subcutaneous Daily  . gabapentin  800 mg Oral BID  . insulin aspart  0-5 Units Subcutaneous QHS  . insulin aspart  0-9 Units Subcutaneous TID WC  . insulin glargine  20 Units Subcutaneous Daily  . oxyCODONE-acetaminophen  1 tablet Oral Once  . pantoprazole  40 mg Oral Daily  . sodium chloride flush  3 mL Intravenous Q12H  . sodium chloride flush  3 mL Intravenous Q12H  . sorbitol, milk of mag, mineral oil, glycerin (SMOG) enema  960 mL Rectal Once  . tamsulosin  0.4 mg Oral Daily   sodium chloride, acetaminophen **OR** acetaminophen, hydrALAZINE, LORazepam, ondansetron **OR** ondansetron (ZOFRAN) IV, oxyCODONE-acetaminophen, polyethylene glycol, sodium chloride flush  Assessment/ Plan:  1.Nephrotic syndrome likely due to diabetic nephropathy: He has severe hypoalbuminemia associated with edema and urine PCR 3.5 g. Urinalysis has glucosuria and proteinuria without hematuria.  Hep B, hep C, HIV, ANA and kappa lambda ratio unremarkable. Kidney ultrasound with normal cortical thickness and without obstruction. The bladder scan  showed urinary retention and patient declined insertion of Foley catheter.  He is still refusing Foley catheter. I will continue to hold ACE inhibitor or ARB while diuresing however this needs to be started after maintaining euvolemia.  Continues to have good urine output  2.Acute kidney injury due to hypertensive emergency and hemodynamic change related with ACE inhibitor. 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 Management as above. Patient appears to be having good response to IV diuretics. Need to rule out obstruction as discussed above. Avoid nephrotoxins including NSAIDs or IV contrast.  Probably has a degree of acute tubular necrosis.  Renal ultrasound did demonstrate bladder distention without hydronephrosis.  3.Bilateral lower extremity edema: diuretics as above.  4.Hypokalemia due to diuretics: Repleted potassium chloride. Monitor electrolytes especially with diuretics.   5.Hypertensive improved blood pressure control  6.anemia T sats 13% we will give IV iron   LOS: [place] @TODAY @7 :45 AM

## 2020-01-12 NOTE — Progress Notes (Signed)
Patient ID: [name], male   DOB: 12-Feb-1961, 59 y.o.   MRN: [id number]  PROGRESS NOTE    [name]  PYK:[id number] DOB: 1960/08/06 DOA: 12/31/2019 PCP: [name], NP   Brief Narrative:  59 y.o. malewith medical history significant forhypertension, poorly controlled diabetes mellitus, chronic leukopenia, history of CVA, and history of substance abuse presented secondary to body aches with evidence of mild rhabdomyolysis and volume overload, found to have nephrotic syndrome and AKI. Started on IV diuresis.  Nephrology following.  Assessment & Plan:   Nephrotic syndrome -Urinalysis significant for large protein/glucose with a urine protein/creatinine of 3.51. Serum albumin of around 2. Albumin IV given.  -Nephrology following and currently on IV Lasix infusion -Daily weights/ strict in out  Abdominal distension -Significant. Associated abdominal pain. Patient states he is not passing gas. Multiple episodes of watery stool. Decreased bowel sounds. No obstruction or volvulus on CT abdomen (11/10). Possibly some colitis, but if so, mild. No leukocytosis. CT scan also with urinary distension. GI pathogen panel negative. Repeat x-ray with ileus vs distal obstruction. Improved slightly after foley placement. Suppository on 11/15 with solid stool output -Mineral oil enema if patient will allow  AKI on CKD stage III AKI secondary to hypertensive emergency/ACE inhibitor use. Baseline creatinine of about 1.1. Peak creatinine of 1.42. benazepril held. Back towards baseline initially with acute worsening.  No hydronephrosis on renal ultrasound. Possibly some associated ATN. -Creatinine pending today.  Chronic diastolic heart dysfunction -Stable.  Primary hypertension Hypertensive emergency on admission. Blood pressure better controlled now but still elevated at times. -Continue atenolol and diltiazem  Anemia Normocytic. Anemia panel suggests possible mixed picture. Stable. No  evidence of bleed. In setting of diarrhea as well as chronic kidney disease.   Diabetes mellitus, type 2 Uncontrolled with hyperglycemia and hypoglycemia Patient is on Lantus 40 units qhs and Januemet as an outpatient. -Continue Lantus 20 units daily and continue SSI  Bicuspid aortic valve Moderate aortic valve stenosis Associated LVH without evidence of LV systolic dysfunction. No LV dilation. -Cardiology follow-up outpatient  Urinary retention Bladder scan on 11/11 was significant for 677mL+ of retained urine. Continued urinary retention and patient continued to refuse foley placement until 11/14. Foley placed successfully on 11/14 -Continue to monitor UOP  Chest pain Likely reflux related but patient does have evidence of cholelithiasis with stone located in the neck of the gallbladder. Chest pain resolved -Continue Protonix   DVT prophylaxis: Lovenox Code Status:   Code Status: Full Code Family Communication: None at bedside Disposition Plan: Status is: Inpatient  Remains inpatient appropriate because:Inpatient level of care appropriate due to severity of illness   Dispo: The patient is from: Home              Anticipated d/c is to: SNF              Anticipated d/c date is: > 3 days              Patient currently is not medically stable to d/c.   Consultants: Nephrology  Procedures: Echo showed EF of 65 to 70% with grade 1 diastolic dysfunction  Antimicrobials: None   Subjective: Patient seen and examined at bedside.  Wakes up slightly, does not participate in conversation much.  Very poor historian.  No overnight fever, vomiting or worsening shortness of breath reported.  Objective: Vitals:   01/11/20 1240 01/11/20 2100 01/12/20 0500 01/12/20 0919  BP: (!) 142/92 (!) 155/92 (!) 143/89 115/85  Pulse: 71  72 78 68  Resp: 20 20 20    Temp: (!) 97.3 F (36.3 C) 98 F (36.7 C) 98.2 F (36.8 C)   TempSrc: Oral Oral Oral   SpO2: 95% 96% 98%   Weight:   126.1  kg   Height:        Intake/Output Summary (Last 24 hours) at 01/12/2020 1202 Last data filed at 01/11/2020 1726 Gross per 24 hour  Intake 98.41 ml  Output 1150 ml  Net -1051.59 ml   Filed Weights   01/08/20 0500 01/10/20 0407 01/12/20 0500  Weight: 134.2 kg 121.8 kg 126.1 kg    Examination:  General exam: Appears calm and comfortable.  Very poor historian. Respiratory system: Bilateral decreased breath sounds at bases with some scattered crackles Cardiovascular system: S1 & S2 heard, Rate controlled Gastrointestinal system: Abdomen is nondistended, soft and nontender. Normal bowel sounds heard. Extremities: No cyanosis, clubbing; bilateral lower extremity edema present Central nervous system: Sleepy, wakes up slightly, hardly participates in any conversation.  No focal neurological deficits. Moving extremities Skin: No rashes, lesions or ulcers Psychiatry: Flat affect    Data Reviewed: I have personally reviewed following labs and imaging studies  CBC: Recent Labs  Lab 01/06/20 0928 01/11/20 0239  WBC 3.0* 3.5*  HGB 9.2* 9.1*  HCT 29.3* 29.0*  MCV 81.8 81.0  PLT 276 601   Basic Metabolic Panel: Recent Labs  Lab 01/06/20 0356 01/06/20 0356 01/07/20 0503 01/07/20 0503 01/08/20 0208 01/09/20 0321 01/09/20 1027 01/10/20 0409 01/11/20 0239  NA 135   < > 137   < > 136 135 134* 135 135  K 3.3*   < > 3.2*   < > 3.8 4.2 4.6 4.5 4.0  CL 103   < > 103   < > 104 102 100 104 103  CO2 24   < > 26   < > 21* 24 22 22 23   GLUCOSE 84   < > 113*   < > 96 101* 100* 103* 114*  BUN 17   < > 18   < > 22* 28* 30* 33* 37*  CREATININE 1.17   < > 1.27*   < > 1.56* 1.94* 2.18* 2.31* 2.11*  CALCIUM 8.8*   < > 9.0   < > 8.9 9.2 9.0 9.1 8.9  MG  --   --   --   --  1.9  --  2.0  --   --   PHOS 3.7  --  4.2  --  4.9* 5.2*  --   --   --    < > = values in this interval not displayed.   GFR: Estimated Creatinine Clearance: 49.4 mL/min (A) (by C-G formula based on SCr of 2.11 mg/dL  (H)). Liver Function Tests: Recent Labs  Lab 01/06/20 0356 01/07/20 0503 01/08/20 0208 01/09/20 0321 01/09/20 1027  AST  --   --   --   --  49*  ALT  --   --   --   --  35  ALKPHOS  --   --   --   --  94  BILITOT  --   --   --   --  0.6  PROT  --   --   --   --  6.9  ALBUMIN 2.8* 2.8* 2.7* 2.9* 2.9*   No results for input(s): LIPASE, AMYLASE in the last 168 hours. Recent Labs  Lab 01/11/20 1053  AMMONIA 35   Coagulation Profile: No results for  input(s): INR, PROTIME in the last 168 hours. Cardiac Enzymes: No results for input(s): CKTOTAL, CKMB, CKMBINDEX, TROPONINI in the last 168 hours. BNP (last 3 results) No results for input(s): PROBNP in the last 8760 hours. HbA1C: No results for input(s): HGBA1C in the last 72 hours. CBG: Recent Labs  Lab 01/11/20 1142 01/11/20 1704 01/11/20 2159 01/12/20 0803 01/12/20 1134  GLUCAP 107* 172* 155* 151* 163*   Lipid Profile: No results for input(s): CHOL, HDL, LDLCALC, TRIG, CHOLHDL, LDLDIRECT in the last 72 hours. Thyroid Function Tests: No results for input(s): TSH, T4TOTAL, FREET4, T3FREE, THYROIDAB in the last 72 hours. Anemia Panel: Recent Labs    01/11/20 0815  TIBC 253  IRON 33*   Sepsis Labs: No results for input(s): PROCALCITON, LATICACIDVEN in the last 168 hours.  Recent Results (from the past 240 hour(s))  SARS Coronavirus 2 by RT PCR (hospital order, performed in [facility] hospital lab) Nasopharyngeal Nasopharyngeal Swab     Status: None   Collection Time: 01/03/20  5:09 PM   Specimen: Nasopharyngeal Swab  Result Value Ref Range Status   SARS Coronavirus 2 NEGATIVE NEGATIVE Final    Comment: (NOTE) SARS-CoV-2 target nucleic acids are NOT DETECTED.  The SARS-CoV-2 RNA is generally detectable in upper and lower respiratory specimens during the acute phase of infection. The lowest concentration of SARS-CoV-2 viral copies this assay can detect is 250 copies / mL. A negative result does not preclude  SARS-CoV-2 infection and should not be used as the sole basis for treatment or other patient management decisions.  A negative result may occur with improper specimen collection / handling, submission of specimen other than nasopharyngeal swab, presence of viral mutation(s) within the areas targeted by this assay, and inadequate number of viral copies (<250 copies / mL). A negative result must be combined with clinical observations, patient history, and epidemiological information.  Fact Sheet for Patients:   StrictlyIdeas.no  Fact Sheet for Healthcare Providers: BankingDealers.co.za  This test is not yet approved or  cleared by the [place] FDA and has been authorized for detection and/or diagnosis of SARS-CoV-2 by FDA under an Emergency Use Authorization (EUA).  This EUA will remain in effect (meaning this test can be used) for the duration of the COVID-19 declaration under Section 564(b)(1) of the Act, 21 U.S.C. section 360bbb-3(b)(1), unless the authorization is terminated or revoked sooner.  Performed at [facility], [place] [address] [place], [place] [address]   Gastrointestinal Panel by PCR , Stool     Status: None   Collection Time: 01/06/20 12:26 PM   Specimen: Stool  Result Value Ref Range Status   Campylobacter species NOT DETECTED NOT DETECTED Final   Plesimonas shigelloides NOT DETECTED NOT DETECTED Final   Salmonella species NOT DETECTED NOT DETECTED Final   Yersinia enterocolitica NOT DETECTED NOT DETECTED Final   Vibrio species NOT DETECTED NOT DETECTED Final   Vibrio cholerae NOT DETECTED NOT DETECTED Final   Enteroaggregative E coli (EAEC) NOT DETECTED NOT DETECTED Final   Enteropathogenic E coli (EPEC) NOT DETECTED NOT DETECTED Final   Enterotoxigenic E coli (ETEC) NOT DETECTED NOT DETECTED Final   Shiga like toxin producing E coli (STEC) NOT DETECTED NOT DETECTED Final   Shigella/Enteroinvasive E coli  (EIEC) NOT DETECTED NOT DETECTED Final   Cryptosporidium NOT DETECTED NOT DETECTED Final   Cyclospora cayetanensis NOT DETECTED NOT DETECTED Final   Entamoeba histolytica NOT DETECTED NOT DETECTED Final   Giardia lamblia NOT DETECTED NOT DETECTED Final  Adenovirus F40/41 NOT DETECTED NOT DETECTED Final   Astrovirus NOT DETECTED NOT DETECTED Final   Norovirus GI/GII NOT DETECTED NOT DETECTED Final   Rotavirus A NOT DETECTED NOT DETECTED Final   Sapovirus (I, II, IV, and V) NOT DETECTED NOT DETECTED Final    Comment: Performed at [facility], [address] [place], [place] [address]  Culture, Urine     Status: None   Collection Time: 01/09/20  3:45 PM   Specimen: Urine, Catheterized  Result Value Ref Range Status   Specimen Description URINE, CATHETERIZED  Final   Special Requests NONE  Final   Culture   Final    NO GROWTH Performed at [facility], [place] [address] [place], [place] [address]    Report Status 01/10/2020 FINAL  Final         Radiology Studies: No results found.      Scheduled Meds: . atenolol  25 mg Oral Daily  . Chlorhexidine Gluconate Cloth  6 each Topical Daily  . diltiazem  180 mg Oral Daily  . enoxaparin (LOVENOX) injection  65 mg Subcutaneous Daily  . gabapentin  800 mg Oral BID  . insulin aspart  0-5 Units Subcutaneous QHS  . insulin aspart  0-9 Units Subcutaneous TID WC  . insulin glargine  20 Units Subcutaneous Daily  . oxyCODONE-acetaminophen  1 tablet Oral Once  . pantoprazole  40 mg Oral Daily  . sodium chloride flush  3 mL Intravenous Q12H  . sodium chloride flush  3 mL Intravenous Q12H  . sorbitol, milk of mag, mineral oil, glycerin (SMOG) enema  960 mL Rectal Once  . tamsulosin  0.4 mg Oral Daily   Continuous Infusions: . sodium chloride    . furosemide (LASIX) 200 mg in dextrose 5% 100 mL (2mg /mL) infusion 10 mg/hr (01/12/20 0320)  . iron sucrose            Aline August, MD [facility] 01/12/2020, 12:02 PM

## 2020-01-13 ENCOUNTER — Inpatient Hospital Stay (HOSPITAL_COMMUNITY): Payer: Medicare HMO

## 2020-01-13 DIAGNOSIS — N184 Chronic kidney disease, stage 4 (severe): Secondary | ICD-10-CM

## 2020-01-13 DIAGNOSIS — N049 Nephrotic syndrome with unspecified morphologic changes: Secondary | ICD-10-CM | POA: Diagnosis not present

## 2020-01-13 DIAGNOSIS — N179 Acute kidney failure, unspecified: Secondary | ICD-10-CM | POA: Diagnosis not present

## 2020-01-13 LAB — CBC WITH DIFFERENTIAL/PLATELET
Abs Immature Granulocytes: 0.01 10*3/uL (ref 0.00–0.07)
Basophils Absolute: 0 10*3/uL (ref 0.0–0.1)
Basophils Relative: 1 %
Eosinophils Absolute: 0.2 10*3/uL (ref 0.0–0.5)
Eosinophils Relative: 6 %
HCT: 33.4 % — ABNORMAL LOW (ref 39.0–52.0)
Hemoglobin: 10.2 g/dL — ABNORMAL LOW (ref 13.0–17.0)
Immature Granulocytes: 0 %
Lymphocytes Relative: 23 %
Lymphs Abs: 0.8 10*3/uL (ref 0.7–4.0)
MCH: 24.8 pg — ABNORMAL LOW (ref 26.0–34.0)
MCHC: 30.5 g/dL (ref 30.0–36.0)
MCV: 81.1 fL (ref 80.0–100.0)
Monocytes Absolute: 0.6 10*3/uL (ref 0.1–1.0)
Monocytes Relative: 17 %
Neutro Abs: 1.9 10*3/uL (ref 1.7–7.7)
Neutrophils Relative %: 53 %
Platelets: 310 10*3/uL (ref 150–400)
RBC: 4.12 MIL/uL — ABNORMAL LOW (ref 4.22–5.81)
RDW: 15.1 % (ref 11.5–15.5)
WBC: 3.5 10*3/uL — ABNORMAL LOW (ref 4.0–10.5)
nRBC: 0 % (ref 0.0–0.2)

## 2020-01-13 LAB — BASIC METABOLIC PANEL
Anion gap: 11 (ref 5–15)
BUN: 41 mg/dL — ABNORMAL HIGH (ref 6–20)
CO2: 25 mmol/L (ref 22–32)
Calcium: 9 mg/dL (ref 8.9–10.3)
Chloride: 100 mmol/L (ref 98–111)
Creatinine, Ser: 1.88 mg/dL — ABNORMAL HIGH (ref 0.61–1.24)
GFR, Estimated: 41 mL/min — ABNORMAL LOW (ref >60)
Glucose, Bld: 180 mg/dL — ABNORMAL HIGH (ref 70–99)
Potassium: 3.1 mmol/L — ABNORMAL LOW (ref 3.5–5.1)
Sodium: 136 mmol/L (ref 135–145)

## 2020-01-13 LAB — GLUCOSE, CAPILLARY
Glucose-Capillary: 164 mg/dL — ABNORMAL HIGH (ref 70–99)
Glucose-Capillary: 182 mg/dL — ABNORMAL HIGH (ref 70–99)
Glucose-Capillary: 168 mg/dL — ABNORMAL HIGH (ref 70–99)
Glucose-Capillary: 150 mg/dL — ABNORMAL HIGH (ref 70–99)
Glucose-Capillary: 137 mg/dL — ABNORMAL HIGH (ref 70–99)

## 2020-01-13 MED ORDER — BISACODYL 10 MG RE SUPP: 10.0000 mg | Freq: Every day | RECTAL | Status: DC | PRN

## 2020-01-13 MED ORDER — POTASSIUM CHLORIDE CRYS ER 20 MEQ PO TBCR
40.0000 meq | EXTENDED_RELEASE_TABLET | ORAL | Status: AC
  Administered 2020-01-13 (×2): 40 meq via ORAL
  Filled 2020-01-13 (×2): qty 2

## 2020-01-13 MED ORDER — ENOXAPARIN SODIUM 60 MG/0.6ML ~~LOC~~ SOLN
60.0000 mg | Freq: Every day | SUBCUTANEOUS | Status: DC
  Administered 2020-01-14 – 2020-01-19 (×5): 60 mg via SUBCUTANEOUS
  Filled 2020-01-13 (×6): qty 0.6

## 2020-01-13 MED ORDER — GABAPENTIN 300 MG PO CAPS
300.0000 mg | ORAL_CAPSULE | Freq: Two times a day (BID) | ORAL | Status: DC
  Administered 2020-01-13 – 2020-01-16 (×6): 300 mg via ORAL
  Filled 2020-01-13 (×5): qty 1
  Filled 2020-01-13: qty 3

## 2020-01-13 MED ORDER — SENNOSIDES-DOCUSATE SODIUM 8.6-50 MG PO TABS
1.0000 | ORAL_TABLET | Freq: Two times a day (BID) | ORAL | Status: DC
  Administered 2020-01-13 – 2020-01-15 (×6): 1 via ORAL
  Filled 2020-01-13 (×6): qty 1

## 2020-01-13 MED ORDER — POLYETHYLENE GLYCOL 3350 17 G PO PACK
17.0000 g | PACK | Freq: Two times a day (BID) | ORAL | Status: DC
  Administered 2020-01-13 – 2020-01-15 (×6): 17 g via ORAL
  Filled 2020-01-13 (×7): qty 1

## 2020-01-13 MED ORDER — OXYCODONE-ACETAMINOPHEN 5-325 MG PO TABS
1.0000 | ORAL_TABLET | Freq: Four times a day (QID) | ORAL | Status: DC | PRN
  Administered 2020-01-13 – 2020-01-16 (×8): 1 via ORAL
  Filled 2020-01-13 (×8): qty 1

## 2020-01-13 NOTE — Progress Notes (Signed)
Patient complains of stomach pain, abdominal distention visible,  patient with episode of watery stool.  Rn asked patient if would allow mineral oil enema, patient refused  RN will continue to monitor patient

## 2020-01-13 NOTE — Progress Notes (Signed)
Patient ID: [name], male   DOB: 1960-09-25, 59 y.o.   MRN: [id number]  PROGRESS NOTE    SHAHRAM ALEXOPOULOS  WER:[id number] DOB: Aug 12, 1960 DOA: 12/31/2019 PCP: [name], NP   Brief Narrative:  59 y.o. malewith medical history significant forhypertension, poorly controlled diabetes mellitus, chronic leukopenia, history of CVA, and history of substance abuse presented secondary to body aches with evidence of mild rhabdomyolysis and volume overload, found to have nephrotic syndrome and AKI. Started on IV diuresis.  Nephrology following.  Assessment & Plan:   Nephrotic syndrome -Urinalysis significant for large protein/glucose with a urine protein/creatinine of 3.51. Serum albumin of around 2. Albumin IV given.  -Nephrology following and currently on IV Lasix infusion -Daily weights/ strict in out  Abdominal distension -Significant. Associated abdominal pain. Patient states he is not passing gas. Multiple episodes of watery stool. Decreased bowel sounds. No obstruction or volvulus on CT abdomen (11/10). Possibly some colitis, but if so, mild. No leukocytosis. CT scan also with urinary distension. GI pathogen panel negative. Repeat x-ray with ileus vs distal obstruction. Improved slightly after foley placement. Suppository on 11/15 with solid stool output -Mineral oil enema if patient will allow -Abdomen is still very distended today.  Will get repeat abdominal x-ray.  AKI on CKD stage III AKI secondary to hypertensive emergency/ACE inhibitor use. Baseline creatinine of about 1.1. Peak creatinine of 1.42. benazepril held. Back towards baseline initially with acute worsening.  No hydronephrosis on renal ultrasound. Possibly some associated ATN. -Creatinine pending today as well.  Chronic diastolic heart dysfunction -Stable.  Primary hypertension Hypertensive emergency on admission. Blood pressure better controlled now but still elevated at times. -Continue atenolol and  diltiazem  Anemia Normocytic. Anemia panel suggests possible mixed picture. Stable. No evidence of bleed. In setting of diarrhea as well as chronic kidney disease.   Diabetes mellitus, type 2 Uncontrolled with hyperglycemia and hypoglycemia Patient is on Lantus 40 units qhs and Januemet as an outpatient. -Continue Lantus 20 units daily and continue SSI  Bicuspid aortic valve Moderate aortic valve stenosis Associated LVH without evidence of LV systolic dysfunction. No LV dilation. -Cardiology follow-up outpatient  Urinary retention Bladder scan on 11/11 was significant for 638mL+ of retained urine. Continued urinary retention and patient continued to refuse foley placement until 11/14. Foley placed successfully on 11/14 -Continue to monitor UOP  Chest pain Likely reflux related but patient does have evidence of cholelithiasis with stone located in the neck of the gallbladder. Chest pain resolved -Continue Protonix   DVT prophylaxis: Lovenox Code Status:   Code Status: Full Code Family Communication: None at bedside Disposition Plan: Status is: Inpatient  Remains inpatient appropriate because:Inpatient level of care appropriate due to severity of illness   Dispo: The patient is from: Home              Anticipated d/c is to: SNF              Anticipated d/c date is: > 3 days              Patient currently is not medically stable to d/c.   Consultants: Nephrology  Procedures: Echo showed EF of 65 to 70% with grade 1 diastolic dysfunction  Antimicrobials: None   Subjective: Patient seen and examined at bedside.  Poor historian, does not want to participate in conversation much.  No overnight fever, vomiting reported.  No worsening shortness of breath reported. Objective: Vitals:   01/12/20 0919 01/12/20 1342 01/12/20 2100 01/13/20 0500  BP: 115/85 133/88 (!) 144/92 (!) 147/90  Pulse: 68 67 63 65  Resp:  20 18 18   Temp:  98 F (36.7 C) 97.8 F (36.6 C) 97.8 F  (36.6 C)  TempSrc:  Oral Oral Oral  SpO2:  96% 97% 98%  Weight:    126 kg  Height:        Intake/Output Summary (Last 24 hours) at 01/13/2020 0758 Last data filed at 01/13/2020 0500 Gross per 24 hour  Intake 223.33 ml  Output 300 ml  Net -76.67 ml   Filed Weights   01/10/20 0407 01/12/20 0500 01/13/20 0500  Weight: 121.8 kg 126.1 kg 126 kg    Examination:  General exam: Extremely poor historian.  No distress. Respiratory system: Bilateral decreased breath sounds at bases with some crackles.  No wheezing  cardiovascular system: Rate controlled, S1-S2 heard. Gastrointestinal system: Abdomen is distended, soft and nontender.  Bowel sounds are heard  extremities: Mild lower extremity edema present.  No cyanosis  Central nervous system: Wakes up slightly, does not want to participate in conversation much.  No focal neurological deficits.  Moves extremities. Skin: No obvious ecchymosis/lesions  psychiatry: Affect is flat  Data Reviewed: I have personally reviewed following labs and imaging studies  CBC: Recent Labs  Lab 01/06/20 0928 01/11/20 0239  WBC 3.0* 3.5*  HGB 9.2* 9.1*  HCT 29.3* 29.0*  MCV 81.8 81.0  PLT 276 458   Basic Metabolic Panel: Recent Labs  Lab 01/07/20 0503 01/07/20 0503 01/08/20 0208 01/09/20 0321 01/09/20 1027 01/10/20 0409 01/11/20 0239  NA 137   < > 136 135 134* 135 135  K 3.2*   < > 3.8 4.2 4.6 4.5 4.0  CL 103   < > 104 102 100 104 103  CO2 26   < > 21* 24 22 22 23   GLUCOSE 113*   < > 96 101* 100* 103* 114*  BUN 18   < > 22* 28* 30* 33* 37*  CREATININE 1.27*   < > 1.56* 1.94* 2.18* 2.31* 2.11*  CALCIUM 9.0   < > 8.9 9.2 9.0 9.1 8.9  MG  --   --  1.9  --  2.0  --   --   PHOS 4.2  --  4.9* 5.2*  --   --   --    < > = values in this interval not displayed.   GFR: Estimated Creatinine Clearance: 49.3 mL/min (A) (by C-G formula based on SCr of 2.11 mg/dL (H)). Liver Function Tests: Recent Labs  Lab 01/07/20 0503 01/08/20 0208  01/09/20 0321 01/09/20 1027  AST  --   --   --  49*  ALT  --   --   --  35  ALKPHOS  --   --   --  94  BILITOT  --   --   --  0.6  PROT  --   --   --  6.9  ALBUMIN 2.8* 2.7* 2.9* 2.9*   No results for input(s): LIPASE, AMYLASE in the last 168 hours. Recent Labs  Lab 01/11/20 1053  AMMONIA 35   Coagulation Profile: No results for input(s): INR, PROTIME in the last 168 hours. Cardiac Enzymes: No results for input(s): CKTOTAL, CKMB, CKMBINDEX, TROPONINI in the last 168 hours. BNP (last 3 results) No results for input(s): PROBNP in the last 8760 hours. HbA1C: No results for input(s): HGBA1C in the last 72 hours. CBG: Recent Labs  Lab 01/11/20 2159 01/12/20 0803 01/12/20 1134 01/12/20  1730 01/12/20 2143  GLUCAP 155* 151* 163* 194* 164*   Lipid Profile: No results for input(s): CHOL, HDL, LDLCALC, TRIG, CHOLHDL, LDLDIRECT in the last 72 hours. Thyroid Function Tests: No results for input(s): TSH, T4TOTAL, FREET4, T3FREE, THYROIDAB in the last 72 hours. Anemia Panel: Recent Labs    01/11/20 0815  TIBC 253  IRON 33*   Sepsis Labs: No results for input(s): PROCALCITON, LATICACIDVEN in the last 168 hours.  Recent Results (from the past 240 hour(s))  SARS Coronavirus 2 by RT PCR (hospital order, performed in [facility] hospital lab) Nasopharyngeal Nasopharyngeal Swab     Status: None   Collection Time: 01/03/20  5:09 PM   Specimen: Nasopharyngeal Swab  Result Value Ref Range Status   SARS Coronavirus 2 NEGATIVE NEGATIVE Final    Comment: (NOTE) SARS-CoV-2 target nucleic acids are NOT DETECTED.  The SARS-CoV-2 RNA is generally detectable in upper and lower respiratory specimens during the acute phase of infection. The lowest concentration of SARS-CoV-2 viral copies this assay can detect is 250 copies / mL. A negative result does not preclude SARS-CoV-2 infection and should not be used as the sole basis for treatment or other patient management decisions.  A negative  result may occur with improper specimen collection / handling, submission of specimen other than nasopharyngeal swab, presence of viral mutation(s) within the areas targeted by this assay, and inadequate number of viral copies (<250 copies / mL). A negative result must be combined with clinical observations, patient history, and epidemiological information.  Fact Sheet for Patients:   StrictlyIdeas.no  Fact Sheet for Healthcare Providers: BankingDealers.co.za  This test is not yet approved or  cleared by the [place] FDA and has been authorized for detection and/or diagnosis of SARS-CoV-2 by FDA under an Emergency Use Authorization (EUA).  This EUA will remain in effect (meaning this test can be used) for the duration of the COVID-19 declaration under Section 564(b)(1) of the Act, 21 U.S.C. section 360bbb-3(b)(1), unless the authorization is terminated or revoked sooner.  Performed at [facility], [place] [address] [place], [place] [address]   Gastrointestinal Panel by PCR , Stool     Status: None   Collection Time: 01/06/20 12:26 PM   Specimen: Stool  Result Value Ref Range Status   Campylobacter species NOT DETECTED NOT DETECTED Final   Plesimonas shigelloides NOT DETECTED NOT DETECTED Final   Salmonella species NOT DETECTED NOT DETECTED Final   Yersinia enterocolitica NOT DETECTED NOT DETECTED Final   Vibrio species NOT DETECTED NOT DETECTED Final   Vibrio cholerae NOT DETECTED NOT DETECTED Final   Enteroaggregative E coli (EAEC) NOT DETECTED NOT DETECTED Final   Enteropathogenic E coli (EPEC) NOT DETECTED NOT DETECTED Final   Enterotoxigenic E coli (ETEC) NOT DETECTED NOT DETECTED Final   Shiga like toxin producing E coli (STEC) NOT DETECTED NOT DETECTED Final   Shigella/Enteroinvasive E coli (EIEC) NOT DETECTED NOT DETECTED Final   Cryptosporidium NOT DETECTED NOT DETECTED Final   Cyclospora cayetanensis NOT DETECTED  NOT DETECTED Final   Entamoeba histolytica NOT DETECTED NOT DETECTED Final   Giardia lamblia NOT DETECTED NOT DETECTED Final   Adenovirus F40/41 NOT DETECTED NOT DETECTED Final   Astrovirus NOT DETECTED NOT DETECTED Final   Norovirus GI/GII NOT DETECTED NOT DETECTED Final   Rotavirus A NOT DETECTED NOT DETECTED Final   Sapovirus (I, II, IV, and V) NOT DETECTED NOT DETECTED Final    Comment: Performed at [facility], [address] [place], [place] [address]  Culture, Urine     Status: None   Collection Time: 01/09/20  3:45 PM   Specimen: Urine, Catheterized  Result Value Ref Range Status   Specimen Description URINE, CATHETERIZED  Final   Special Requests NONE  Final   Culture   Final    NO GROWTH Performed at [facility], [address], [place], [place] [address]    Report Status 01/10/2020 FINAL  Final         Radiology Studies: No results found.      Scheduled Meds: . atenolol  25 mg Oral Daily  . Chlorhexidine Gluconate Cloth  6 each Topical Daily  . diltiazem  180 mg Oral Daily  . enoxaparin (LOVENOX) injection  65 mg Subcutaneous Daily  . gabapentin  800 mg Oral BID  . insulin aspart  0-5 Units Subcutaneous QHS  . insulin aspart  0-9 Units Subcutaneous TID WC  . insulin glargine  20 Units Subcutaneous Daily  . oxyCODONE-acetaminophen  1 tablet Oral Once  . pantoprazole  40 mg Oral Daily  . sodium chloride flush  3 mL Intravenous Q12H  . sodium chloride flush  3 mL Intravenous Q12H  . sorbitol, milk of mag, mineral oil, glycerin (SMOG) enema  960 mL Rectal Once  . tamsulosin  0.4 mg Oral Daily   Continuous Infusions: . sodium chloride    . furosemide (LASIX) 200 mg in dextrose 5% 100 mL (2mg /mL) infusion 10 mg/hr (01/13/20 0026)  . iron sucrose 100 mg (01/12/20 1205)          Aline August, MD [facility] 01/13/2020, 7:58 AM

## 2020-01-13 NOTE — Progress Notes (Signed)
[name] ROUNDING NOTE   Subjective:   Brief history  59 year old gentleman with history of hypertension poorly controlled diabetes neuropathy retinopathy hemoglobin A1c 10.6 history of CVA history of congestive heart failure admitted 12/31/2019 with hypertensive emergency hyperkalemia proteinuria and elevated creatinine.  Baseline creatinine about 1.1 mg/dL.  Has nephrotic range proteinuria secondary diabetic nephropathy.  He has an unremarkable serological evaluation.  Including hepatitis B hepatitis C HIV ANA and kappa lambda light chain ratio.  Renal ultrasound showed no obstruction.  He was started on IV Lasix now has rising creatinine.  He is refusing Foley catheter we are holding his ACE inhibitor and ARB.  Repeat ultrasound shows relative distention of urinary bladder.  Blood pressure 147/90 pulse 68 temperature 97.8 O2 sats 98% room air  No urine output recorded 01/12/2020 urine output since 01/13/2020 was 300 cc  Labs of 01/11/2019 one of the last set of labs we have.  We need to get daily renal panels.   Objective:  Vital signs in last 24 hours:  Temp:  [97.8 F (36.6 C)-98 F (36.7 C)] 97.8 F (36.6 C) (11/18 0500) Pulse Rate:  [63-68] 65 (11/18 0500) Resp:  [18-20] 18 (11/18 0500) BP: (115-147)/(85-92) 147/90 (11/18 0500) SpO2:  [96 %-98 %] 98 % (11/18 0500) Weight:  [126 kg] 126 kg (11/18 0500)  Weight change: -0.1 kg Filed Weights   01/10/20 0407 01/12/20 0500 01/13/20 0500  Weight: 121.8 kg 126.1 kg 126 kg    Intake/Output: I/O last 3 completed shifts: In: 223.3 [I.V.:123.3; IV Piggyback:100] Out: 300 [Urine:300]   Intake/Output this shift:  No intake/output data recorded.  General:NAD, comfortable Heart:RRR, s1s2 nl Lungs:clear b/l, no crackle Abdomen:soft, Non-tender, distended. Extremities: Bilateral lower extremities pitting edema++, not much improvement Neurology: Alert awake and following commands  Basic Metabolic Panel: Recent  Labs  Lab 01/07/20 0503 01/07/20 0503 01/08/20 0208 01/08/20 0208 01/09/20 0321 01/09/20 0321 01/09/20 1027 01/10/20 0409 01/11/20 0239  NA 137   < > 136  --  135  --  134* 135 135  K 3.2*   < > 3.8  --  4.2  --  4.6 4.5 4.0  CL 103   < > 104  --  102  --  100 104 103  CO2 26   < > 21*  --  24  --  22 22 23   GLUCOSE 113*   < > 96  --  101*  --  100* 103* 114*  BUN 18   < > 22*  --  28*  --  30* 33* 37*  CREATININE 1.27*   < > 1.56*  --  1.94*  --  2.18* 2.31* 2.11*  CALCIUM 9.0   < > 8.9   < > 9.2   < > 9.0 9.1 8.9  MG  --   --  1.9  --   --   --  2.0  --   --   PHOS 4.2  --  4.9*  --  5.2*  --   --   --   --    < > = values in this interval not displayed.    Liver Function Tests: Recent Labs  Lab 01/07/20 0503 01/08/20 0208 01/09/20 0321 01/09/20 1027  AST  --   --   --  49*  ALT  --   --   --  35  ALKPHOS  --   --   --  94  BILITOT  --   --   --  0.6  PROT  --   --   --  6.9  ALBUMIN 2.8* 2.7* 2.9* 2.9*   No results for input(s): LIPASE, AMYLASE in the last 168 hours. Recent Labs  Lab 01/11/20 1053  AMMONIA 35    CBC: Recent Labs  Lab 01/06/20 0928 01/11/20 0239  WBC 3.0* 3.5*  HGB 9.2* 9.1*  HCT 29.3* 29.0*  MCV 81.8 81.0  PLT 276 285    Cardiac Enzymes: No results for input(s): CKTOTAL, CKMB, CKMBINDEX, TROPONINI in the last 168 hours.  BNP: Invalid input(s): POCBNP  CBG: Recent Labs  Lab 01/11/20 1704 01/11/20 2159 01/12/20 0803 01/12/20 1134 01/12/20 1730  GLUCAP 172* 155* 151* 163* 194*    Microbiology: Results for orders placed or performed during the hospital encounter of 12/31/19  Respiratory Panel by RT PCR (Flu A&B, Covid) - Nasopharyngeal Swab     Status: None   Collection Time: 12/31/19  2:00 PM   Specimen: Nasopharyngeal Swab  Result Value Ref Range Status   SARS Coronavirus 2 by RT PCR NEGATIVE NEGATIVE Final    Comment: (NOTE) SARS-CoV-2 target nucleic acids are NOT DETECTED.  The SARS-CoV-2 RNA is generally  detectable in upper respiratoy specimens during the acute phase of infection. The lowest concentration of SARS-CoV-2 viral copies this assay can detect is 131 copies/mL. A negative result does not preclude SARS-Cov-2 infection and should not be used as the sole basis for treatment or other patient management decisions. A negative result may occur with  improper specimen collection/handling, submission of specimen other than nasopharyngeal swab, presence of viral mutation(s) within the areas targeted by this assay, and inadequate number of viral copies (<131 copies/mL). A negative result must be combined with clinical observations, patient history, and epidemiological information. The expected result is Negative.  Fact Sheet for Patients:  PinkCheek.be  Fact Sheet for Healthcare Providers:  GravelBags.it  This test is no t yet approved or cleared by the [place] FDA and  has been authorized for detection and/or diagnosis of SARS-CoV-2 by FDA under an Emergency Use Authorization (EUA). This EUA will remain  in effect (meaning this test can be used) for the duration of the COVID-19 declaration under Section 564(b)(1) of the Act, 21 U.S.C. section 360bbb-3(b)(1), unless the authorization is terminated or revoked sooner.     Influenza A by PCR NEGATIVE NEGATIVE Final   Influenza B by PCR NEGATIVE NEGATIVE Final    Comment: (NOTE) The Xpert Xpress SARS-CoV-2/FLU/RSV assay is intended as an aid in  the diagnosis of influenza from Nasopharyngeal swab specimens and  should not be used as a sole basis for treatment. Nasal washings and  aspirates are unacceptable for Xpert Xpress SARS-CoV-2/FLU/RSV  testing.  Fact Sheet for Patients: PinkCheek.be  Fact Sheet for Healthcare Providers: GravelBags.it  This test is not yet approved or cleared by the [place] FDA and   has been authorized for detection and/or diagnosis of SARS-CoV-2 by  FDA under an Emergency Use Authorization (EUA). This EUA will remain  in effect (meaning this test can be used) for the duration of the  Covid-19 declaration under Section 564(b)(1) of the Act, 21  U.S.C. section 360bbb-3(b)(1), unless the authorization is  terminated or revoked. Performed at [facility], [place] [address] [place], [place] [address]   SARS Coronavirus 2 by RT PCR (hospital order, performed in [facility] hospital lab) Nasopharyngeal Nasopharyngeal Swab     Status: None   Collection Time: 01/03/20  5:09 PM   Specimen:  Nasopharyngeal Swab  Result Value Ref Range Status   SARS Coronavirus 2 NEGATIVE NEGATIVE Final    Comment: (NOTE) SARS-CoV-2 target nucleic acids are NOT DETECTED.  The SARS-CoV-2 RNA is generally detectable in upper and lower respiratory specimens during the acute phase of infection. The lowest concentration of SARS-CoV-2 viral copies this assay can detect is 250 copies / mL. A negative result does not preclude SARS-CoV-2 infection and should not be used as the sole basis for treatment or other patient management decisions.  A negative result may occur with improper specimen collection / handling, submission of specimen other than nasopharyngeal swab, presence of viral mutation(s) within the areas targeted by this assay, and inadequate number of viral copies (<250 copies / mL). A negative result must be combined with clinical observations, patient history, and epidemiological information.  Fact Sheet for Patients:   StrictlyIdeas.no  Fact Sheet for Healthcare Providers: BankingDealers.co.za  This test is not yet approved or  cleared by the [place] FDA and has been authorized for detection and/or diagnosis of SARS-CoV-2 by FDA under an Emergency Use Authorization (EUA).  This EUA will remain in effect (meaning this test  can be used) for the duration of the COVID-19 declaration under Section 564(b)(1) of the Act, 21 U.S.C. section 360bbb-3(b)(1), unless the authorization is terminated or revoked sooner.  Performed at [facility], [place] [address], [place], [place] [address]   Gastrointestinal Panel by PCR , Stool     Status: None   Collection Time: 01/06/20 12:26 PM   Specimen: Stool  Result Value Ref Range Status   Campylobacter species NOT DETECTED NOT DETECTED Final   Plesimonas shigelloides NOT DETECTED NOT DETECTED Final   Salmonella species NOT DETECTED NOT DETECTED Final   Yersinia enterocolitica NOT DETECTED NOT DETECTED Final   Vibrio species NOT DETECTED NOT DETECTED Final   Vibrio cholerae NOT DETECTED NOT DETECTED Final   Enteroaggregative E coli (EAEC) NOT DETECTED NOT DETECTED Final   Enteropathogenic E coli (EPEC) NOT DETECTED NOT DETECTED Final   Enterotoxigenic E coli (ETEC) NOT DETECTED NOT DETECTED Final   Shiga like toxin producing E coli (STEC) NOT DETECTED NOT DETECTED Final   Shigella/Enteroinvasive E coli (EIEC) NOT DETECTED NOT DETECTED Final   Cryptosporidium NOT DETECTED NOT DETECTED Final   Cyclospora cayetanensis NOT DETECTED NOT DETECTED Final   Entamoeba histolytica NOT DETECTED NOT DETECTED Final   Giardia lamblia NOT DETECTED NOT DETECTED Final   Adenovirus F40/41 NOT DETECTED NOT DETECTED Final   Astrovirus NOT DETECTED NOT DETECTED Final   Norovirus GI/GII NOT DETECTED NOT DETECTED Final   Rotavirus A NOT DETECTED NOT DETECTED Final   Sapovirus (I, II, IV, and V) NOT DETECTED NOT DETECTED Final    Comment: Performed at [facility], [address] [place], [place] [address]  Culture, Urine     Status: None   Collection Time: 01/09/20  3:45 PM   Specimen: Urine, Catheterized  Result Value Ref Range Status   Specimen Description URINE, CATHETERIZED  Final   Special Requests NONE  Final   Culture   Final    NO GROWTH Performed at [facility], [address] [place], [place] [address]    Report Status 01/10/2020 FINAL  Final    Coagulation Studies: No results for input(s): LABPROT, INR in the last 72 hours.  Urinalysis: No results for input(s): COLORURINE, LABSPEC, PHURINE, GLUCOSEU, HGBUR, BILIRUBINUR, KETONESUR, PROTEINUR, UROBILINOGEN, NITRITE, LEUKOCYTESUR in the last 72 hours.  Invalid input(s): APPERANCEUR    Imaging: No results found.   Medications:   . sodium chloride    . furosemide (LASIX) 200 mg in dextrose 5% 100 mL (2mg /mL) infusion 10 mg/hr (01/13/20 0026)  . iron sucrose 100 mg (01/12/20 1205)   . atenolol  25 mg Oral Daily  . Chlorhexidine Gluconate Cloth  6 each Topical Daily  . diltiazem  180 mg Oral Daily  . enoxaparin (LOVENOX) injection  65 mg Subcutaneous Daily  . gabapentin  800 mg Oral BID  . insulin aspart  0-5 Units Subcutaneous QHS  . insulin aspart  0-9 Units Subcutaneous TID WC  . insulin glargine  20 Units Subcutaneous Daily  . oxyCODONE-acetaminophen  1 tablet Oral Once  . pantoprazole  40 mg Oral Daily  . sodium chloride flush  3 mL Intravenous Q12H  . sodium chloride flush  3 mL Intravenous Q12H  . sorbitol, milk of mag, mineral oil, glycerin (SMOG) enema  960 mL Rectal Once  . tamsulosin  0.4 mg Oral Daily   sodium chloride, acetaminophen **OR** acetaminophen, hydrALAZINE, LORazepam, ondansetron **OR** ondansetron (ZOFRAN) IV, oxyCODONE-acetaminophen, polyethylene glycol, sodium chloride flush  Assessment/ Plan:  1.Nephrotic syndrome likely due to diabetic nephropathy: He has severe hypoalbuminemia associated with edema and urine PCR 3.5 g. Urinalysis has glucosuria and proteinuria without hematuria.  Hep B, hep C, HIV, ANA and kappa lambda ratio unremarkable. Kidney ultrasound with normal cortical thickness and without obstruction. The bladder scan showed urinary retention and patient declined insertion of Foley catheter.  He is still refusing Foley catheter. I  will continue to hold ACE inhibitor or ARB while diuresing however this needs to be started after maintaining euvolemia.  Continues to have good urine output.  Although no urine urine output was recorded 01/12/2019  2.Acute kidney injury due to hypertensive emergency and hemodynamic change related with ACE inhibitor. 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 Management as above. Patient appears to be having good response to IV diuretics. Need to rule out obstruction as discussed above. Avoid nephrotoxins including NSAIDs or IV contrast.  Probably has a degree of acute tubular necrosis.  Renal ultrasound did demonstrate bladder distention without hydronephrosis.  3.Bilateral lower extremity edema: diuretics as above.  4.Hypokalemia due to diuretics: Repleted potassium chloride. Monitor electrolytes especially with diuretics.   5.Hypertensive improved blood pressure control  6.anemia T sats 13%.  IV iron administered   LOS: [place] [place] @TODAY @7 :26 AM

## 2020-01-13 NOTE — Care Management Important Message (Signed)
Important Message  Patient Details  Name: [name] MRN: [id number] Date of Birth: Nov 30, 1960   Medicare Important Message Given:  Yes     Shelda Altes 01/13/2020, 11:27 AM

## 2020-01-14 ENCOUNTER — Encounter: Payer: Self-pay | Admitting: General Practice

## 2020-01-14 DIAGNOSIS — I10 Essential (primary) hypertension: Secondary | ICD-10-CM | POA: Diagnosis not present

## 2020-01-14 DIAGNOSIS — N049 Nephrotic syndrome with unspecified morphologic changes: Secondary | ICD-10-CM | POA: Diagnosis not present

## 2020-01-14 DIAGNOSIS — N179 Acute kidney failure, unspecified: Secondary | ICD-10-CM | POA: Diagnosis not present

## 2020-01-14 LAB — RENAL FUNCTION PANEL
Albumin: 2.5 g/dL — ABNORMAL LOW (ref 3.5–5.0)
Anion gap: 9 (ref 5–15)
BUN: 42 mg/dL — ABNORMAL HIGH (ref 6–20)
CO2: 24 mmol/L (ref 22–32)
Calcium: 9 mg/dL (ref 8.9–10.3)
Chloride: 104 mmol/L (ref 98–111)
Creatinine, Ser: 1.73 mg/dL — ABNORMAL HIGH (ref 0.61–1.24)
GFR, Estimated: 45 mL/min — ABNORMAL LOW (ref >60)
Glucose, Bld: 130 mg/dL — ABNORMAL HIGH (ref 70–99)
Phosphorus: 5 mg/dL — ABNORMAL HIGH (ref 2.5–4.6)
Potassium: 3 mmol/L — ABNORMAL LOW (ref 3.5–5.1)
Sodium: 137 mmol/L (ref 135–145)

## 2020-01-14 LAB — CBC WITH DIFFERENTIAL/PLATELET
Abs Immature Granulocytes: 0.03 10*3/uL (ref 0.00–0.07)
Basophils Absolute: 0 10*3/uL (ref 0.0–0.1)
Basophils Relative: 0 %
Eosinophils Absolute: 0.3 10*3/uL (ref 0.0–0.5)
Eosinophils Relative: 7 %
HCT: 31.7 % — ABNORMAL LOW (ref 39.0–52.0)
Hemoglobin: 9.9 g/dL — ABNORMAL LOW (ref 13.0–17.0)
Immature Granulocytes: 1 %
Lymphocytes Relative: 22 %
Lymphs Abs: 1 10*3/uL (ref 0.7–4.0)
MCH: 25.2 pg — ABNORMAL LOW (ref 26.0–34.0)
MCHC: 31.2 g/dL (ref 30.0–36.0)
MCV: 80.7 fL (ref 80.0–100.0)
Monocytes Absolute: 0.8 10*3/uL (ref 0.1–1.0)
Monocytes Relative: 19 %
Neutro Abs: 2.3 10*3/uL (ref 1.7–7.7)
Neutrophils Relative %: 51 %
Platelets: 280 10*3/uL (ref 150–400)
RBC: 3.93 MIL/uL — ABNORMAL LOW (ref 4.22–5.81)
RDW: 15.2 % (ref 11.5–15.5)
WBC: 4.4 10*3/uL (ref 4.0–10.5)
nRBC: 0 % (ref 0.0–0.2)

## 2020-01-14 LAB — GLUCOSE, CAPILLARY
Glucose-Capillary: 115 mg/dL — ABNORMAL HIGH (ref 70–99)
Glucose-Capillary: 176 mg/dL — ABNORMAL HIGH (ref 70–99)
Glucose-Capillary: 140 mg/dL — ABNORMAL HIGH (ref 70–99)
Glucose-Capillary: 186 mg/dL — ABNORMAL HIGH (ref 70–99)

## 2020-01-14 LAB — TROPONIN I (HIGH SENSITIVITY)
Troponin I (High Sensitivity): 15 ng/L (ref <18)
Troponin I (High Sensitivity): 16 ng/L (ref <18)

## 2020-01-14 LAB — MAGNESIUM: Magnesium: 2.1 mg/dL (ref 1.7–2.4)

## 2020-01-14 MED ORDER — POTASSIUM CHLORIDE CRYS ER 20 MEQ PO TBCR
40.0000 meq | EXTENDED_RELEASE_TABLET | Freq: Once | ORAL | Status: AC
  Administered 2020-01-14: 40 meq via ORAL
  Filled 2020-01-14: qty 2

## 2020-01-14 MED ORDER — ADULT MULTIVITAMIN W/MINERALS CH
1.0000 | ORAL_TABLET | Freq: Every day | ORAL | Status: DC
  Administered 2020-01-15: 1 via ORAL
  Filled 2020-01-14 (×3): qty 1

## 2020-01-14 MED ORDER — NITROGLYCERIN 0.4 MG SL SUBL: 0.4000 mg | SUBLINGUAL_TABLET | SUBLINGUAL | Status: DC | PRN

## 2020-01-14 MED ORDER — POTASSIUM CHLORIDE 10 MEQ/100ML IV SOLN
10.0000 meq | INTRAVENOUS | Status: DC
  Filled 2020-01-14: qty 100

## 2020-01-14 MED ORDER — ENSURE ENLIVE PO LIQD
237.0000 mL | Freq: Three times a day (TID) | ORAL | Status: DC
  Administered 2020-01-14 – 2020-01-16 (×5): 237 mL via ORAL

## 2020-01-14 MED ORDER — POTASSIUM CHLORIDE CRYS ER 20 MEQ PO TBCR
40.0000 meq | EXTENDED_RELEASE_TABLET | Freq: Once | ORAL | Status: DC
  Filled 2020-01-14: qty 2

## 2020-01-14 MED ORDER — PHENAZOPYRIDINE HCL 100 MG PO TABS
100.0000 mg | ORAL_TABLET | Freq: Three times a day (TID) | ORAL | Status: DC
  Administered 2020-01-14 – 2020-01-16 (×4): 100 mg via ORAL
  Filled 2020-01-14 (×10): qty 1

## 2020-01-14 NOTE — Progress Notes (Signed)
Physical Therapy Treatment Patient Details Name: [name] MRN: [id number] DOB: 11/11/1960 Today's Date: 01/14/2020    History of Present Illness 59 y.o. male with medical history significant for hypertension, poorly controlled diabetes mellitus, chronic leukopenia, history of CVA, and history of substance abuse, now presenting to the emergency department for evaluation of aches. He has not taken any of his medicines in a couple days at least, and has developed cramping pain mainly in his legs. Pt found to be hypertensive, hypokalemic and nephrotic syndrome.    PT Comments    Pt making slow progress. Continues to need SNF at dc for further rehab.    Follow Up Recommendations  SNF     Equipment Recommendations  Hospital bed;Other (comment) (hoyer lift and pad)    Recommendations for Other Services       Precautions / Restrictions Precautions Precautions: Fall Precaution Comments: Increased edema limits ROM of joints    Mobility  Bed Mobility Overal bed mobility: Needs Assistance Bed Mobility: Supine to Sit     Supine to sit: +2 for physical assistance;Mod assist     General bed mobility comments: Assist to bring legs off EOB and elevate trunk into sitting  Transfers Overall transfer level: Needs assistance Equipment used: Ambulation equipment used (sara stedy.) Transfers: Sit to/from Stand Sit to Stand: +2 physical assistance;Mod assist;From elevated surface         General transfer comment: Assist to bring hips up. Incr time and verbal/tactile cues to extend hips and trunk. Used Stedy for bed to Psychologist, counselling [place] (Stroke Patients Only)       Balance Overall balance assessment: Needs assistance Sitting-balance support: Feet supported Sitting balance-Leahy Scale: Fair     Standing balance support: Bilateral upper extremity supported;During functional  activity Standing balance-Leahy Scale: Poor Standing balance comment: Stedy and +2 min for static standing                            Cognition Arousal/Alertness: Awake/alert Behavior During Therapy: Anxious Overall Cognitive Status: No family/caregiver present to determine baseline cognitive functioning                                        Exercises      General Comments        Pertinent Vitals/Pain Pain Assessment: Faces Faces Pain Scale: Hurts even more Pain Location: Bilateral knees Pain Descriptors / Indicators: Guarding;Grimacing Pain Intervention(s): Monitored during session;Repositioned    Home Living                      Prior Function            PT Goals (current goals can now be found in the care plan section) Progress towards PT goals: Progressing toward goals    Frequency    Min 2X/week      PT Plan Current plan remains appropriate    Co-evaluation              AM-PAC PT "6 Clicks" Mobility   Outcome Measure  Help needed turning from your back to your side while in a flat bed without using  bedrails?: Total Help needed moving from lying on your back to sitting on the side of a flat bed without using bedrails?: A Lot Help needed moving to and from a bed to a chair (including a wheelchair)?: Total Help needed standing up from a chair using your arms (e.g., wheelchair or bedside chair)?: Total Help needed to walk in hospital room?: Total Help needed climbing 3-5 steps with a railing? : Total 6 Click Score: 7    End of Session   Activity Tolerance: Patient tolerated treatment well Patient left: with call bell/phone within reach;in chair;with chair alarm set Nurse Communication: Mobility status;Need for lift equipment (maximove pad in chair if needed) PT Visit Diagnosis: Other abnormalities of gait and mobility (R26.89);Muscle weakness (generalized) (M62.81);Difficulty in walking, not elsewhere  classified (R26.2)     Time: 5834-6219 PT Time Calculation (min) (ACUTE ONLY): 28 min  Charges:  $Therapeutic Activity: 23-37 mins                     [place] Pager [phone] Office [place] 01/14/2020, 12:01 PM

## 2020-01-14 NOTE — Progress Notes (Signed)
This RN started IV potassium infusion. Patient began complaining of pain in bladder after being hooked up to the medicine and asked that it be stopped until he spoke with the doctor. Attempted to educate patient on purpose of IV potassium. The patient still wants to wait to talk to the doctor.The patient is also saying his chest is starting to hurt. Starla Link, MD notified. See orders.

## 2020-01-14 NOTE — Progress Notes (Signed)
Patient ID: [name], male   DOB: [birth year], 59 y.o.   MRN: [id number]  PROGRESS NOTE    [name]  OVF:[id number] DOB: [birth year] DOA: 12/31/2019 PCP: [name], NP   Brief Narrative:  59 y.o. malewith medical history significant forhypertension, poorly controlled diabetes mellitus, chronic leukopenia, history of CVA, and history of substance abuse presented secondary to body aches with evidence of mild rhabdomyolysis and volume overload, found to have nephrotic syndrome and AKI. Started on IV diuresis.  Nephrology following.  Assessment & Plan:   Nephrotic syndrome -Urinalysis significant for large protein/glucose with a urine protein/creatinine of 3.51. Serum albumin of around 2. Albumin IV given.  -Nephrology following and currently on IV Lasix infusion -Daily weights/ strict input and output.  Negative balance of 11,670.2 cc since admission.  Abdominal distension -Significant. Associated abdominal pain. Patient states he is not passing gas. Multiple episodes of watery stool. Decreased bowel sounds. No obstruction or volvulus on CT abdomen (11/10). Possibly some colitis, but if so, mild. No leukocytosis. CT scan also with urinary distension. GI pathogen panel negative. Repeat x-ray with ileus vs distal obstruction. Improved slightly after foley placement. Suppository on 11/15 with solid stool output -Mineral oil enema if patient will allow -X-ray on 01/13/2020 showed persistent diffuse gaseous colonic distention.  Added scheduled Senokot and MiraLAX with as needed Dulcolax.  AKI on CKD stage III AKI secondary to hypertensive emergency/ACE inhibitor use. Baseline creatinine of about 1.1. Peak creatinine of 1.42. benazepril held. Back towards baseline initially with acute worsening.  No hydronephrosis on renal ultrasound. Possibly some associated ATN. -Creatinine 1.73 today  Hypokalemia -Replace.  Repeat a.m. labs  Chronic diastolic heart  dysfunction -Stable.  Primary hypertension Hypertensive emergency on admission. Blood pressure better controlled now but still elevated at times. -Continue atenolol and diltiazem  Anemia Normocytic. Anemia panel suggests possible mixed picture. Stable. No evidence of bleed. In setting of diarrhea as well as chronic kidney disease.   Diabetes mellitus, type 2 Uncontrolled with hyperglycemia and hypoglycemia Patient is on Lantus 40 units qhs and Januemet as an outpatient. -Continue Lantus 20 units daily and continue SSI  Bicuspid aortic valve Moderate aortic valve stenosis Associated LVH without evidence of LV systolic dysfunction. No LV dilation. -Cardiology follow-up outpatient  Urinary retention Bladder scan on 11/11 was significant for 69mL+ of retained urine. Continued urinary retention and patient continued to refuse foley placement until 11/14. Foley placed successfully on 11/14 -will need outpatient urology evaluation  Chest pain Likely reflux related but patient does have evidence of cholelithiasis with stone located in the neck of the gallbladder. Chest pain resolved -Continue Protonix   DVT prophylaxis: Lovenox Code Status:   Code Status: Full Code Family Communication: None at bedside Disposition Plan: Status is: Inpatient  Remains inpatient appropriate because:Inpatient level of care appropriate due to severity of illness   Dispo: The patient is from: Home              Anticipated d/c is to: SNF              Anticipated d/c date is: > 3 days              Patient currently is not medically stable to d/c.   Consultants: Nephrology  Procedures: Echo showed EF of 65 to 70% with grade 1 diastolic dysfunction  Antimicrobials: None   Subjective: Patient seen and examined at bedside.  Extremely poor historian.  Hardly participates in any conversation.  No overnight fever,  vomiting reported.   Objective: Vitals:   59 y.o. male   DOB: [birth year], 59 y.o.   MRN: [id number]  PROGRESS NOTE    [name]  OVF:[id number] DOB: [birth year] DOA: 12/31/2019 PCP: [name], NP   Brief Narrative:  59 y.o. malewith medical history significant forhypertension, poorly controlled diabetes mellitus, chronic leukopenia, history of CVA, and history of substance abuse presented secondary to body aches with evidence of mild rhabdomyolysis and volume overload, found to have nephrotic syndrome and AKI. Started on IV diuresis.  Nephrology following.  Assessment & Plan:   Nephrotic syndrome -Urinalysis significant for large protein/glucose with a urine protein/creatinine of 3.51. Serum albumin of around 2. Albumin IV given.  -Nephrology following and currently on IV Lasix infusion -Daily weights/ strict input and output.  Negative balance of 11,670.2 cc since admission.  Abdominal distension -Significant. Associated abdominal pain. Patient states he is not passing gas. Multiple episodes of watery stool. Decreased bowel sounds. No obstruction or volvulus on CT abdomen (11/10). Possibly some colitis, but if so, mild. No leukocytosis. CT scan also with urinary distension. GI pathogen panel negative. Repeat x-ray with ileus vs distal obstruction. Improved slightly after foley placement. Suppository on 11/15 with solid stool output -Mineral oil enema if patient will allow -X-ray on 01/13/2020 showed persistent diffuse gaseous colonic distention.  Added scheduled Senokot and MiraLAX with as needed Dulcolax.  AKI on CKD stage III AKI secondary to hypertensive emergency/ACE inhibitor use. Baseline creatinine of about 1.1. Peak creatinine of 1.42. benazepril held. Back towards baseline initially with acute worsening.  No hydronephrosis on renal ultrasound. Possibly some associated ATN. -Creatinine 1.73 today  Hypokalemia -Replace.  Repeat a.m. labs  Chronic diastolic heart  dysfunction -Stable.  Primary hypertension Hypertensive emergency on admission. Blood pressure better controlled now but still elevated at times. -Continue atenolol and diltiazem  Anemia Normocytic. Anemia panel suggests possible mixed picture. Stable. No evidence of bleed. In setting of diarrhea as well as chronic kidney disease.   Diabetes mellitus, type 2 Uncontrolled with hyperglycemia and hypoglycemia Patient is on Lantus 40 units qhs and Januemet as an outpatient. -Continue Lantus 20 units daily and continue SSI  Bicuspid aortic valve Moderate aortic valve stenosis Associated LVH without evidence of LV systolic dysfunction. No LV dilation. -Cardiology follow-up outpatient  Urinary retention Bladder scan on 11/11 was significant for 69mL+ of retained urine. Continued urinary retention and patient continued to refuse foley placement until 11/14. Foley placed successfully on 11/14 -will need outpatient urology evaluation  Chest pain Likely reflux related but patient does have evidence of cholelithiasis with stone located in the neck of the gallbladder. Chest pain resolved -Continue Protonix   DVT prophylaxis: Lovenox Code Status:   Code Status: Full Code Family Communication: None at bedside Disposition Plan: Status is: Inpatient  Remains inpatient appropriate because:Inpatient level of care appropriate due to severity of illness   Dispo: The patient is from: Home              Anticipated d/c is to: SNF              Anticipated d/c date is: > 3 days              Patient currently is not medically stable to d/c.   Consultants: Nephrology  Procedures: Echo showed EF of 65 to 70% with grade 1 diastolic dysfunction  Antimicrobials: None   Subjective: Patient seen and examined at bedside.  Extremely poor historian.  Hardly participates in any conversation.  No overnight fever,  vomiting reported.   Objective: Vitals:   01/13/20 0500 01/13/20 1541 01/13/20  2030 01/14/20 0426  BP: (!) 147/90 (!) 152/93 (!) 136/99 (!) 137/95  Pulse: 65 68 67 69  Resp: 18 18 18 18   Temp: 97.8 F (36.6 C) (!) 97.5 F (36.4 C) 98.5 F (36.9 C) 98.5 F (36.9 C)  TempSrc: Oral Oral Oral Oral  SpO2: 98% 98% 96% 94%  Weight: 126 kg   126.3 kg  Height:        Intake/Output Summary (Last 24 hours) at 01/14/2020 0746 Last data filed at 01/14/2020 0427 Gross per 24 hour  Intake 3 ml  Output 1450 ml  Net -1447 ml   Filed Weights   01/12/20 0500 01/13/20 0500 01/14/20 0426  Weight: 126.1 kg 126 kg 126.3 kg    Examination:  General exam: No acute distress.  Very poor historian. Respiratory system: Decreased breath sounds at bases bilaterally with scattered crackles  cardiovascular system: S1-S2 heard, rate controlled Gastrointestinal system: Abdomen is morbidly obese, distended, soft and nontender.  Bowel sounds are heard  extremities: No clubbing.  Lower extremity edema present. Central nervous system: Hardly participates in any conversation.  No focal neurological deficits.  Moving extremities Skin: No obvious petechiae/rashes  psychiatry: Extremely flat affect  Data Reviewed: I have personally reviewed following labs and imaging studies  CBC: Recent Labs  Lab 01/11/20 0239 01/13/20 0706 01/14/20 0507  WBC 3.5* 3.5* 4.4  NEUTROABS  --  1.9 2.3  HGB 9.1* 10.2* 9.9*  HCT 29.0* 33.4* 31.7*  MCV 81.0 81.1 80.7  PLT 285 310 742   Basic Metabolic Panel: Recent Labs  Lab 01/08/20 0208 01/08/20 0208 01/09/20 0321 01/09/20 0321 01/09/20 1027 01/10/20 0409 01/11/20 0239 01/13/20 0706 01/14/20 0507  NA 136   < > 135   < > 134* 135 135 136 137  K 3.8   < > 4.2   < > 4.6 4.5 4.0 3.1* 3.0*  CL 104   < > 102   < > 100 104 103 100 104  CO2 21*   < > 24   < > 22 22 23 25 24   GLUCOSE 96   < > 101*   < > 100* 103* 114* 180* 130*  BUN 22*   < > 28*   < > 30* 33* 37* 41* 42*  CREATININE 1.56*   < > 1.94*   < > 2.18* 2.31* 2.11* 1.88* 1.73*  CALCIUM  8.9   < > 9.2   < > 9.0 9.1 8.9 9.0 9.0  MG 1.9  --   --   --  2.0  --   --   --  2.1  PHOS 4.9*  --  5.2*  --   --   --   --   --   --  5.0*   < > = values in this interval not displayed.   GFR: Estimated Creatinine Clearance: 60.3 mL/min (A) (by C-G formula based on SCr of 1.73 mg/dL (H)). Liver Function Tests: Recent [place] 1541 01/13/20  2030 01/14/20 0426  BP: (!) 147/90 (!) 152/93 (!) 136/99 (!) 137/95  Pulse: 65 68 67 69  Resp: 18 18 18 18   Temp: 97.8 F (36.6 C) (!) 97.5 F (36.4 C) 98.5 F (36.9 C) 98.5 F (36.9 C)  TempSrc: Oral Oral Oral Oral  SpO2: 98% 98% 96% 94%  Weight: 126 kg   126.3 kg  Height:        Intake/Output Summary (Last 24 hours) at 01/14/2020 0746 Last data filed at 01/14/2020 0427 Gross per 24 hour  Intake 3 ml  Output 1450 ml  Net -1447 ml   Filed Weights   01/12/20 0500 01/13/20 0500 01/14/20 0426  Weight: 126.1 kg 126 kg 126.3 kg    Examination:  General exam: No acute distress.  Very poor historian. Respiratory system: Decreased breath sounds at bases bilaterally with scattered crackles  cardiovascular system: S1-S2 heard, rate controlled Gastrointestinal system: Abdomen is morbidly obese, distended, soft and nontender.  Bowel sounds are heard  extremities: No clubbing.  Lower extremity edema present. Central nervous system: Hardly participates in any conversation.  No focal neurological deficits.  Moving extremities Skin: No obvious petechiae/rashes  psychiatry: Extremely flat affect  Data Reviewed: I have personally reviewed following labs and imaging studies  CBC: Recent Labs  Lab 01/11/20 0239 01/13/20 0706 01/14/20 0507  WBC 3.5* 3.5* 4.4  NEUTROABS  --  1.9 2.3  HGB 9.1* 10.2* 9.9*  HCT 29.0* 33.4* 31.7*  MCV 81.0 81.1 80.7  PLT 285 310 742   Basic Metabolic Panel: Recent Labs  Lab 01/08/20 0208 01/08/20 0208 01/09/20 0321 01/09/20 0321 01/09/20 1027 01/10/20 0409 01/11/20 0239 01/13/20 0706 01/14/20 0507  NA 136   < > 135   < > 134* 135 135 136 137  K 3.8   < > 4.2   < > 4.6 4.5 4.0 3.1* 3.0*  CL 104   < > 102   < > 100 104 103 100 104  CO2 21*   < > 24   < > 22 22 23 25 24   GLUCOSE 96   < > 101*   < > 100* 103* 114* 180* 130*  BUN 22*   < > 28*   < > 30* 33* 37* 41* 42*  CREATININE 1.56*   < > 1.94*   < > 2.18* 2.31* 2.11* 1.88* 1.73*  CALCIUM  8.9   < > 9.2   < > 9.0 9.1 8.9 9.0 9.0  MG 1.9  --   --   --  2.0  --   --   --  2.1  PHOS 4.9*  --  5.2*  --   --   --   --   --  5.0*   < > = values in this interval not displayed.   GFR: Estimated Creatinine Clearance: 60.3 mL/min (A) (by C-G formula based on SCr of 1.73 mg/dL (H)). Liver Function Tests: Recent Labs  Lab 01/08/20 0208 01/09/20 0321 01/09/20 1027 01/14/20 0507  AST  --   --  49*  --   ALT  --   --  35  --   ALKPHOS  --   --  94  --   BILITOT  --   --  0.6  --   PROT  --   --  6.9  --   ALBUMIN 2.7* 2.9* 2.9* 2.5*   No results for  input(s): LIPASE, AMYLASE in the last 168 hours. Recent Labs  Lab 01/11/20 1053  AMMONIA 35   Coagulation Profile: No results for input(s): INR, PROTIME in the last 168 hours. Cardiac Enzymes: No results for input(s): CKTOTAL, CKMB, CKMBINDEX, TROPONINI in the last 168 hours. BNP (last 3 results) No results for input(s): PROBNP in the last 8760 hours. HbA1C: No results for input(s): HGBA1C in the last 72 hours. CBG: Recent Labs  Lab 01/12/20 2143 01/13/20 0800 01/13/20 1120 01/13/20 1613 01/13/20 2236  GLUCAP 164* 182* 168* 150* 137*   Lipid Profile: No results for input(s): CHOL, HDL, LDLCALC, TRIG, CHOLHDL, LDLDIRECT in the last 72 hours. Thyroid Function Tests: No results for input(s): TSH, T4TOTAL, FREET4, T3FREE, THYROIDAB in the last 72 hours. Anemia Panel: Recent Labs    01/11/20 0815  TIBC 253  IRON 33*   Sepsis Labs: No results for input(s): PROCALCITON, LATICACIDVEN in the last 168 hours.  Recent Results (from the past 240 hour(s))  Gastrointestinal Panel by PCR , Stool     Status: None   Collection Time: 01/06/20 12:26 PM   Specimen: Stool  Result Value Ref Range Status   Campylobacter species NOT DETECTED NOT DETECTED Final   Plesimonas shigelloides NOT DETECTED NOT DETECTED Final   Salmonella species NOT DETECTED NOT DETECTED Final   Yersinia enterocolitica NOT DETECTED NOT DETECTED Final    Vibrio species NOT DETECTED NOT DETECTED Final   Vibrio cholerae NOT DETECTED NOT DETECTED Final   Enteroaggregative E coli (EAEC) NOT DETECTED NOT DETECTED Final   Enteropathogenic E coli (EPEC) NOT DETECTED NOT DETECTED Final   Enterotoxigenic E coli (ETEC) NOT DETECTED NOT DETECTED Final   Shiga like toxin producing E coli (STEC) NOT DETECTED NOT DETECTED Final   Shigella/Enteroinvasive E coli (EIEC) NOT DETECTED NOT DETECTED Final   Cryptosporidium NOT DETECTED NOT DETECTED Final   Cyclospora cayetanensis NOT DETECTED NOT DETECTED Final   Entamoeba histolytica NOT DETECTED NOT DETECTED Final   Giardia lamblia NOT DETECTED NOT DETECTED Final   Adenovirus F40/41 NOT DETECTED NOT DETECTED Final   Astrovirus NOT DETECTED NOT DETECTED Final   Norovirus GI/GII NOT DETECTED NOT DETECTED Final   Rotavirus A NOT DETECTED NOT DETECTED Final   Sapovirus (I, II, IV, and V) NOT DETECTED NOT DETECTED Final    Comment: Performed at [facility], [address] [place], [place] [address]  Culture, Urine     Status: None   Collection Time: 01/09/20  3:45 PM   Specimen: Urine, Catheterized  Result Value Ref Range Status   Specimen Description URINE, CATHETERIZED  Final   Special Requests NONE  Final   Culture   Final    NO GROWTH Performed at [facility], [address] [place], [place] [address]    Report Status 01/10/2020 FINAL  Final         Radiology Studies: DG Abd Portable 1V  Result Date: 01/13/2020 CLINICAL DATA:  Abdominal distention. EXAM: PORTABLE ABDOMEN - 1 VIEW COMPARISON:  Limb 1421 FINDINGS: Diffuse gaseous bowel distention noted, mainly colon, similar to prior. No unexpected abdominopelvic calcification. IMPRESSION: Persistent diffuse gaseous colonic distention, similar to prior. Electronically Signed   By: Misty Stanley M.D.   On: 01/13/2020 10:58        Scheduled Meds: . atenolol  25 mg Oral Daily  . Chlorhexidine Gluconate Cloth  6 each Topical  Daily  . diltiazem  180 mg Oral Daily  . enoxaparin (LOVENOX) injection  60 mg  Subcutaneous Daily  . gabapentin  300 mg Oral BID  . insulin aspart  0-5 Units Subcutaneous QHS  . insulin aspart  0-9 Units Subcutaneous TID WC  . insulin glargine  20 Units Subcutaneous Daily  . oxyCODONE-acetaminophen  1 tablet Oral Once  . pantoprazole  40 mg Oral Daily  . polyethylene glycol  17 g Oral BID  . senna-docusate  1 tablet Oral BID  . sodium chloride flush  3 mL Intravenous Q12H  . sodium chloride flush  3 mL Intravenous Q12H  . sorbitol, milk of mag, mineral oil, glycerin (SMOG) enema  960 mL Rectal Once  . tamsulosin  0.4 mg Oral Daily   Continuous Infusions: . sodium chloride    . furosemide (LASIX) 200 mg in dextrose 5% 100 mL (2mg /mL) infusion 10 mg/hr (01/13/20 2142)  . iron sucrose 100 mg (01/13/20 1224)  . potassium chloride            Aline August, MD [facility] 01/14/2020, 7:46 AM

## 2020-01-14 NOTE — Progress Notes (Signed)
[facility] ROUNDING NOTE   Subjective:   Brief history  59 year old gentleman with history of hypertension poorly controlled diabetes neuropathy retinopathy hemoglobin A1c 10.6 history of CVA history of congestive heart failure admitted 12/31/2019 with hypertensive emergency hyperkalemia proteinuria and elevated creatinine.  Baseline creatinine about 1.1 mg/dL.  Has nephrotic range proteinuria secondary diabetic nephropathy.  He has an unremarkable serological evaluation.  Including hepatitis B hepatitis C HIV ANA and kappa lambda light chain ratio.  Renal ultrasound showed no obstruction.  He was started on IV Lasix now has rising creatinine.  He is refusing Foley catheter we are holding his ACE inhibitor and ARB.  Repeat ultrasound shows relative distention of urinary bladder.  Blood pressure 137/95 pulse 69 temperature 98.5 O2 sats 93% room air  Urine output 1.3 L 01/13/2020  Sodium 137 potassium 3 chloride 104 CO2 24 BUN 42 creatinine 1.7 glucose 130 calcium 9 phosphorus 5 magnesium 2.1 hemoglobin 9.9    Objective:  Vital signs in last 24 hours:  Temp:  [97.5 F (36.4 C)-98.5 F (36.9 C)] 98.5 F (36.9 C) (11/19 0426) Pulse Rate:  [67-69] 69 (11/19 0426) Resp:  [18] 18 (11/19 0426) BP: (136-152)/(93-99) 137/95 (11/19 0426) SpO2:  [94 %-98 %] 94 % (11/19 0426) Weight:  [126.3 kg] 126.3 kg (11/19 0426)  Weight change: 0.3 kg Filed Weights   01/12/20 0500 01/13/20 0500 01/14/20 0426  Weight: 126.1 kg 126 kg 126.3 kg    Intake/Output: I/O last 3 completed shifts: In: 3 [I.V.:3] Out: 1750 [Urine:1750]   Intake/Output this shift:  No intake/output data recorded.  General:NAD, comfortable Heart:RRR, s1s2 nl Lungs:clear b/l, no crackle Abdomen:soft, Non-tender, distended. Extremities: Bilateral lower extremities pitting edema++, not much improvement Neurology: Alert awake and following commands  Basic Metabolic Panel: Recent Labs  Lab 01/08/20 0208  01/08/20 0208 01/09/20 0321 01/09/20 0321 01/09/20 1027 01/09/20 1027 01/10/20 0409 01/10/20 0409 01/11/20 0239 01/13/20 0706 01/14/20 0507  NA 136   < > 135   < > 134*  --  135  --  135 136 137  K 3.8   < > 4.2   < > 4.6  --  4.5  --  4.0 3.1* 3.0*  CL 104   < > 102   < > 100  --  104  --  103 100 104  CO2 21*   < > 24   < > 22  --  22  --  23 25 24   GLUCOSE 96   < > 101*   < > 100*  --  103*  --  114* 180* 130*  BUN 22*   < > 28*   < > 30*  --  33*  --  37* 41* 42*  CREATININE 1.56*   < > 1.94*   < > 2.18*  --  2.31*  --  2.11* 1.88* 1.73*  CALCIUM 8.9   < > 9.2   < > 9.0   < > 9.1   < > 8.9 9.0 9.0  MG 1.9  --   --   --  2.0  --   --   --   --   --  2.1  PHOS 4.9*  --  5.2*  --   --   --   --   --   --   --  5.0*   < > = values in this interval not displayed.    Liver Function Tests: Recent Labs  Lab 01/08/20 0208 01/09/20 0321  01/09/20 1027 01/14/20 0507  AST  --   --  49*  --   ALT  --   --  35  --   ALKPHOS  --   --  94  --   BILITOT  --   --  0.6  --   PROT  --   --  6.9  --   ALBUMIN 2.7* 2.9* 2.9* 2.5*   No results for input(s): LIPASE, AMYLASE in the last 168 hours. Recent Labs  Lab 01/11/20 1053  AMMONIA 35    CBC: Recent Labs  Lab 01/11/20 0239 01/13/20 0706 01/14/20 0507  WBC 3.5* 3.5* 4.4  NEUTROABS  --  1.9 2.3  HGB 9.1* 10.2* 9.9*  HCT 29.0* 33.4* 31.7*  MCV 81.0 81.1 80.7  PLT 285 310 280    Cardiac Enzymes: No results for input(s): CKTOTAL, CKMB, CKMBINDEX, TROPONINI in the last 168 hours.  BNP: Invalid input(s): POCBNP  CBG: Recent Labs  Lab 01/12/20 2143 01/13/20 0800 01/13/20 1120 01/13/20 1613 01/13/20 2236  GLUCAP 164* 182* 168* 150* 63*    Microbiology: Results for orders placed or performed during the hospital encounter of 12/31/19  Respiratory Panel by RT PCR (Flu A&B, Covid) - Nasopharyngeal Swab     Status: None   Collection Time: 12/31/19  2:00 PM   Specimen: Nasopharyngeal Swab  Result Value Ref Range  Status   SARS Coronavirus 2 by RT PCR NEGATIVE NEGATIVE Final    Comment: (NOTE) SARS-CoV-2 target nucleic acids are NOT DETECTED.  The SARS-CoV-2 RNA is generally detectable in upper respiratoy specimens during the acute phase of infection. The lowest concentration of SARS-CoV-2 viral copies this assay can detect is 131 copies/mL. A negative result does not preclude SARS-Cov-2 infection and should not be used as the sole basis for treatment or other patient management decisions. A negative result may occur with  improper specimen collection/handling, submission of specimen other than nasopharyngeal swab, presence of viral mutation(s) within the areas targeted by this assay, and inadequate number of viral copies (<131 copies/mL). A negative result must be combined with clinical observations, patient history, and epidemiological information. The expected result is Negative.  Fact Sheet for Patients:  PinkCheek.be  Fact Sheet for Healthcare Providers:  GravelBags.it  This test is no t yet approved or cleared by the [place] FDA and  has been authorized for detection and/or diagnosis of SARS-CoV-2 by FDA under an Emergency Use Authorization (EUA). This EUA will remain  in effect (meaning this test can be used) for the duration of the COVID-19 declaration under Section 564(b)(1) of the Act, 21 U.S.C. section 360bbb-3(b)(1), unless the authorization is terminated or revoked sooner.     Influenza A by PCR NEGATIVE NEGATIVE Final   Influenza B by PCR NEGATIVE NEGATIVE Final    Comment: (NOTE) The Xpert Xpress SARS-CoV-2/FLU/RSV assay is intended as an aid in  the diagnosis of influenza from Nasopharyngeal swab specimens and  should not be used as a sole basis for treatment. Nasal washings and  aspirates are unacceptable for Xpert Xpress SARS-CoV-2/FLU/RSV  testing.  Fact Sheet for  Patients: PinkCheek.be  Fact Sheet for Healthcare Providers: GravelBags.it  This test is not yet approved or cleared by the [place] FDA and  has been authorized for detection and/or diagnosis of SARS-CoV-2 by  FDA under an Emergency Use Authorization (EUA). This EUA will remain  in effect (meaning this test can be used) for the duration of the  Covid-19 declaration under Section 564(b)(1)  of the Act, 21  U.S.C. section 360bbb-3(b)(1), unless the authorization is  terminated or revoked. Performed at [facility], [place] [address] [place], [place] [address]   SARS Coronavirus 2 by RT PCR (hospital order, performed in [facility] hospital lab) Nasopharyngeal Nasopharyngeal Swab     Status: None   Collection Time: 01/03/20  5:09 PM   Specimen: Nasopharyngeal Swab  Result Value Ref Range Status   SARS Coronavirus 2 NEGATIVE NEGATIVE Final    Comment: (NOTE) SARS-CoV-2 target nucleic acids are NOT DETECTED.  The SARS-CoV-2 RNA is generally detectable in upper and lower respiratory specimens during the acute phase of infection. The lowest concentration of SARS-CoV-2 viral copies this assay can detect is 250 copies / mL. A negative result does not preclude SARS-CoV-2 infection and should not be used as the sole basis for treatment or other patient management decisions.  A negative result may occur with improper specimen collection / handling, submission of specimen other than nasopharyngeal swab, presence of viral mutation(s) within the areas targeted by this assay, and inadequate number of viral copies (<250 copies / mL). A negative result must be combined with clinical observations, patient history, and epidemiological information.  Fact Sheet for Patients:   StrictlyIdeas.no  Fact Sheet for Healthcare Providers: BankingDealers.co.za  This test is not yet approved or   cleared by the [place] FDA and has been authorized for detection and/or diagnosis of SARS-CoV-2 by FDA under an Emergency Use Authorization (EUA).  This EUA will remain in effect (meaning this test can be used) for the duration of the COVID-19 declaration under Section 564(b)(1) of the Act, 21 U.S.C. section 360bbb-3(b)(1), unless the authorization is terminated or revoked sooner.  Performed at [facility], [place] [address], [place], [place] [address]   Gastrointestinal Panel by PCR , Stool     Status: None   Collection Time: 01/06/20 12:26 PM   Specimen: Stool  Result Value Ref Range Status   Campylobacter species NOT DETECTED NOT DETECTED Final   Plesimonas shigelloides NOT DETECTED NOT DETECTED Final   Salmonella species NOT DETECTED NOT DETECTED Final   Yersinia enterocolitica NOT DETECTED NOT DETECTED Final   Vibrio species NOT DETECTED NOT DETECTED Final   Vibrio cholerae NOT DETECTED NOT DETECTED Final   Enteroaggregative E coli (EAEC) NOT DETECTED NOT DETECTED Final   Enteropathogenic E coli (EPEC) NOT DETECTED NOT DETECTED Final   Enterotoxigenic E coli (ETEC) NOT DETECTED NOT DETECTED Final   Shiga like toxin producing E coli (STEC) NOT DETECTED NOT DETECTED Final   Shigella/Enteroinvasive E coli (EIEC) NOT DETECTED NOT DETECTED Final   Cryptosporidium NOT DETECTED NOT DETECTED Final   Cyclospora cayetanensis NOT DETECTED NOT DETECTED Final   Entamoeba histolytica NOT DETECTED NOT DETECTED Final   Giardia lamblia NOT DETECTED NOT DETECTED Final   Adenovirus F40/41 NOT DETECTED NOT DETECTED Final   Astrovirus NOT DETECTED NOT DETECTED Final   Norovirus GI/GII NOT DETECTED NOT DETECTED Final   Rotavirus A NOT DETECTED NOT DETECTED Final   Sapovirus (I, II, IV, and V) NOT DETECTED NOT DETECTED Final    Comment: Performed at [facility], [address] [place], [place] [address]  Culture, Urine     Status: None   Collection Time: 01/09/20  3:45 PM    Specimen: Urine, Catheterized  Result Value Ref Range Status   Specimen Description URINE, CATHETERIZED  Final   Special Requests NONE  Final   Culture   Final  NO GROWTH Performed at [facility], [place] [address], [place], [place] [address]    Report Status 01/10/2020 FINAL  Final    Coagulation Studies: No results for input(s): LABPROT, INR in the last 72 hours.  Urinalysis: No results for input(s): COLORURINE, LABSPEC, PHURINE, GLUCOSEU, HGBUR, BILIRUBINUR, KETONESUR, PROTEINUR, UROBILINOGEN, NITRITE, LEUKOCYTESUR in the last 72 hours.  Invalid input(s): APPERANCEUR    Imaging: DG Abd Portable 1V  Result Date: 01/13/2020 CLINICAL DATA:  Abdominal distention. EXAM: PORTABLE ABDOMEN - 1 VIEW COMPARISON:  Limb 1421 FINDINGS: Diffuse gaseous bowel distention noted, mainly colon, similar to prior. No unexpected abdominopelvic calcification. IMPRESSION: Persistent diffuse gaseous colonic distention, similar to prior. Electronically Signed   By: Misty Stanley M.D.   On: 01/13/2020 10:58     Medications:   . sodium chloride    . furosemide (LASIX) 200 mg in dextrose 5% 100 mL (2mg /mL) infusion 10 mg/hr (01/13/20 2142)  . iron sucrose 100 mg (01/13/20 1224)   . atenolol  25 mg Oral Daily  . Chlorhexidine Gluconate Cloth  6 each Topical Daily  . diltiazem  180 mg Oral Daily  . enoxaparin (LOVENOX) injection  60 mg Subcutaneous Daily  . gabapentin  300 mg Oral BID  . insulin aspart  0-5 Units Subcutaneous QHS  . insulin aspart  0-9 Units Subcutaneous TID WC  . insulin glargine  20 Units Subcutaneous Daily  . oxyCODONE-acetaminophen  1 tablet Oral Once  . pantoprazole  40 mg Oral Daily  . polyethylene glycol  17 g Oral BID  . senna-docusate  1 tablet Oral BID  . sodium chloride flush  3 mL Intravenous Q12H  . sodium chloride flush  3 mL Intravenous Q12H  . sorbitol, milk of mag, mineral oil, glycerin (SMOG) enema  960 mL Rectal Once  . tamsulosin  0.4 mg Oral Daily    sodium chloride, acetaminophen **OR** acetaminophen, bisacodyl, hydrALAZINE, LORazepam, ondansetron **OR** ondansetron (ZOFRAN) IV, oxyCODONE-acetaminophen, sodium chloride flush  Assessment/ Plan:  1.Nephrotic syndrome likely due to diabetic nephropathy: He has severe hypoalbuminemia associated with edema and urine PCR 3.5 g. Urinalysis has glucosuria and proteinuria without hematuria.  Hep B, hep C, HIV, ANA and kappa lambda ratio unremarkable. Kidney ultrasound with normal cortical thickness and without obstruction. The bladder scan showed urinary retention and patient declined insertion of Foley catheter.  He is still refusing Foley catheter. I will continue to hold ACE inhibitor or ARB while diuresing however this needs to be started after maintaining euvolemia.  Continues to have good urine output.   2.Acute kidney injury due to hypertensive emergency and hemodynamic change related with ACE inhibitor. 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 Management as above. Patient appears to be having good response to IV diuretics. Need to rule out obstruction as discussed above. Avoid nephrotoxins including NSAIDs or IV contrast.  Probably has a degree of acute tubular necrosis.  Creatinine appears to be improving.  Baseline creatinine appears to be within normal range.  3.Bilateral lower extremity edema: diuretics as above.  4.Hypokalemia due to diuretics:  Will replete  5.Hypertensive improved blood pressure control  6.anemia T sats 13%.  IV iron administered   LOS: [place] @TODAY @7 :30 AM

## 2020-01-14 NOTE — Progress Notes (Signed)
Initial Nutrition Assessment  DOCUMENTATION CODES:   Not applicable  INTERVENTION:    Ensure Enlive po TID, each supplement provides 350 kcal and 20 grams of protein  MVI with minerals daily  NUTRITION DIAGNOSIS:   Inadequate oral intake related to nausea, vomiting, poor appetite as evidenced by meal completion < 50%.  GOAL:   Patient will meet greater than or equal to 90% of their needs  MONITOR:   PO intake, Supplement acceptance, Labs, Skin  REASON FOR ASSESSMENT:   LOS    ASSESSMENT:   59 yo male admitted with mild rhabdomyolysis, nephrotic syndrome, AKI. PMH includes HTN, DM, Bipolar affective disorder, CHF, chronic leukopenia, CVA, substance abuse (cocaine).   PO intake documented at 0-50% of meals for the past week.   Unable to speak with patient at this time. Per MD notes, patient reported vomiting over night. Abdominal xray 11/18 showed persistent diffuse gaseous colonic distention. Scheduled Senokot and Miralax added.  Intake has been poor since admission. Patient would benefit from a PO supplement to help maximize intake of protein and calories.   Labs reviewed. K 3, BUN 42, Creat 1.73, Phos 5 CBG: 150-137-115  Medications reviewed and include novolog, lantus, protonix, miralax, potassium chloride, senokot-s, flomax, IV iron sucrose.  Current weight (126.3 kg) is above usual weight (110.7 kg on 12/12/19) due to fluid overload. Diuresis is ongoing.   Diet Order:   Diet Order            Diet heart healthy/carb modified Room service appropriate? Yes; Fluid consistency: Thin; Fluid restriction: 1500 mL Fluid  Diet effective now                 EDUCATION NEEDS:   Not appropriate for education at this time  Skin:  Skin Assessment: Reviewed RN Assessment (open wound to penis)  Last BM:  11/18  Height:   Ht Readings from Last 1 Encounters:  01/01/20 5\' 8"  (1.727 m)    Weight:   Wt Readings from Last 1 Encounters:  01/14/20 126.3 kg     Ideal Body Weight:  70 kg  BMI:  Body mass index is 42.34 kg/m.  Estimated Nutritional Needs:   Kcal:  2200-2400  Protein:  110-130 gm  Fluid:  2.2-2.4 L    Lucas Mallow, RD, LDN, CNSC Please refer to Amion for contact information.

## 2020-01-14 NOTE — Progress Notes (Signed)
Physical Therapy Treatment Patient Details Name: [name] MRN: [id number] DOB: 08-Mar-1960 Today's Date: 01/14/2020    History of Present Illness 59 y.o. male with medical history significant for hypertension, poorly controlled diabetes mellitus, chronic leukopenia, history of CVA, and history of substance abuse, now presenting to the emergency department for evaluation of aches. He has not taken any of his medicines in a couple days at least, and has developed cramping pain mainly in his legs. Pt found to be hypertensive, hypokalemic and nephrotic syndrome.    PT Comments    Pt seen for second session to assist up from chair. Pt with improvement compared to last time he had to stand from the chair but still requires significant assist and the use of the Stedy to get back to bed.    Follow Up Recommendations  SNF     Equipment Recommendations  Hospital bed;Other (comment) (hoyer lift and pad)    Recommendations for Other Services       Precautions / Restrictions Precautions Precautions: Fall Precaution Comments: Increased edema limits ROM of joints    Mobility  Bed Mobility Overal bed mobility: Needs Assistance Bed Mobility: Sit to Supine       Sit to supine: +2 for physical assistance;Mod assist   General bed mobility comments: Assist to lower trunk and to bring legs up into bed  Transfers Overall transfer level: Needs assistance Equipment used: Ambulation equipment used;Rolling walker (2 wheeled) Transfers: Sit to/from Stand Sit to Stand: +2 physical assistance;Mod assist         General transfer comment: Stood x 2 with rolling walker from chair but unable to get center of gravity out over his feet and so posterior lean. Then use Stedy to stand and back to bed with Stedy.   Ambulation/Gait                 Stairs             Wheelchair Mobility    Modified Rankin (Stroke Patients Only)       Balance Overall balance assessment: Needs  assistance Sitting-balance support: Feet supported Sitting balance-Leahy Scale: Fair     Standing balance support: Bilateral upper extremity supported;During functional activity Standing balance-Leahy Scale: Poor Standing balance comment: Stedy and +2 min for static standing                            Cognition Arousal/Alertness: Awake/alert Behavior During Therapy: Anxious Overall Cognitive Status: No family/caregiver present to determine baseline cognitive functioning                                        Exercises      General Comments        Pertinent Vitals/Pain Pain Assessment: Faces Faces Pain Scale: Hurts even more Pain Location: BLE's Pain Descriptors / Indicators: Guarding;Grimacing;Tightness Pain Intervention(s): Monitored during session;Repositioned    Home Living                      Prior Function            PT Goals (current goals can now be found in the care plan section) Progress towards PT goals: Progressing toward goals    Frequency    Min 2X/week      PT Plan Current plan remains appropriate    Co-evaluation  AM-PAC PT "6 Clicks" Mobility   Outcome Measure  Help needed turning from your back to your side while in a flat bed without using bedrails?: Total Help needed moving from lying on your back to sitting on the side of a flat bed without using bedrails?: A Lot Help needed moving to and from a bed to a chair (including a wheelchair)?: Total Help needed standing up from a chair using your arms (e.g., wheelchair or bedside chair)?: A Lot Help needed to walk in hospital room?: Total Help needed climbing 3-5 steps with a railing? : Total 6 Click Score: 8    End of Session Equipment Utilized During Treatment: Gait belt Activity Tolerance: Patient tolerated treatment well Patient left: with call bell/phone within reach;in bed;with bed alarm set Nurse Communication: Mobility  status;Need for lift equipment (maximove pad in chair if needed) PT Visit Diagnosis: Other abnormalities of gait and mobility (R26.89);Muscle weakness (generalized) (M62.81);Difficulty in walking, not elsewhere classified (R26.2)     Time: 9892-1194 PT Time Calculation (min) (ACUTE ONLY): 25 min  Charges:  $Therapeutic Activity: 23-37 mins                     [place] Pager [phone] Office [place] 01/14/2020, 4:11 PM

## 2020-01-15 DIAGNOSIS — E876 Hypokalemia: Secondary | ICD-10-CM | POA: Diagnosis not present

## 2020-01-15 DIAGNOSIS — I10 Essential (primary) hypertension: Secondary | ICD-10-CM | POA: Diagnosis not present

## 2020-01-15 DIAGNOSIS — F1999 Other psychoactive substance use, unspecified with unspecified psychoactive substance-induced disorder: Secondary | ICD-10-CM

## 2020-01-15 DIAGNOSIS — I5189 Other ill-defined heart diseases: Secondary | ICD-10-CM | POA: Diagnosis not present

## 2020-01-15 DIAGNOSIS — N049 Nephrotic syndrome with unspecified morphologic changes: Secondary | ICD-10-CM | POA: Diagnosis not present

## 2020-01-15 LAB — RENAL FUNCTION PANEL
Albumin: 2.2 g/dL — ABNORMAL LOW (ref 3.5–5.0)
Anion gap: 11 (ref 5–15)
BUN: 39 mg/dL — ABNORMAL HIGH (ref 6–20)
CO2: 25 mmol/L (ref 22–32)
Calcium: 8.9 mg/dL (ref 8.9–10.3)
Chloride: 102 mmol/L (ref 98–111)
Creatinine, Ser: 1.43 mg/dL — ABNORMAL HIGH (ref 0.61–1.24)
GFR, Estimated: 57 mL/min — ABNORMAL LOW (ref >60)
Glucose, Bld: 135 mg/dL — ABNORMAL HIGH (ref 70–99)
Phosphorus: 3.9 mg/dL (ref 2.5–4.6)
Potassium: 2.7 mmol/L — CL (ref 3.5–5.1)
Sodium: 138 mmol/L (ref 135–145)

## 2020-01-15 LAB — GLUCOSE, CAPILLARY
Glucose-Capillary: 120 mg/dL — ABNORMAL HIGH (ref 70–99)
Glucose-Capillary: 150 mg/dL — ABNORMAL HIGH (ref 70–99)
Glucose-Capillary: 156 mg/dL — ABNORMAL HIGH (ref 70–99)
Glucose-Capillary: 144 mg/dL — ABNORMAL HIGH (ref 70–99)

## 2020-01-15 MED ORDER — POTASSIUM CHLORIDE 10 MEQ/100ML IV SOLN
10.0000 meq | Freq: Once | INTRAVENOUS | Status: AC
  Administered 2020-01-15: 10 meq via INTRAVENOUS
  Filled 2020-01-15: qty 100

## 2020-01-15 MED ORDER — POTASSIUM CHLORIDE CRYS ER 20 MEQ PO TBCR
40.0000 meq | EXTENDED_RELEASE_TABLET | Freq: Four times a day (QID) | ORAL | Status: AC
  Administered 2020-01-15 (×2): 40 meq via ORAL
  Filled 2020-01-15 (×2): qty 2

## 2020-01-15 NOTE — Progress Notes (Signed)
Patient ID: [name], male   DOB: [birth year], 59 y.o.   MRN: [id number]  PROGRESS NOTE    LEOBARDO GRANLUND  SVX:[id number] DOB: 11/16/1960 DOA: 12/31/2019 PCP: [name], NP   Brief Narrative:  59 y.o. malewith medical history significant forhypertension, poorly controlled diabetes mellitus, chronic leukopenia, history of CVA, and history of substance abuse presented secondary to body aches with evidence of mild rhabdomyolysis and volume overload, found to have nephrotic syndrome and AKI. Started on IV diuresis.  Nephrology following.  Assessment & Plan:   Nephrotic syndrome -Urinalysis significant for large protein/glucose with a urine protein/creatinine of 3.51. Serum albumin of around 2. Albumin IV given.  -Nephrology following and patient is still on Lasix drip -Daily weights/ strict input and output.  Negative balance of 14,273.2 cc since admission.  Abdominal distension -Significant. Associated abdominal pain. Patient states he is not passing gas. Multiple episodes of watery stool. Decreased bowel sounds. No obstruction or volvulus on CT abdomen (11/10). Possibly some colitis, but if so, mild. No leukocytosis. CT scan also with urinary distension. GI pathogen panel negative. Repeat x-ray with ileus vs distal obstruction. Improved slightly after foley placement. Suppository on 11/15 with solid stool output -Mineral oil enema if patient will allow -X-ray on 01/13/2020 showed persistent diffuse gaseous colonic distention.  Added scheduled Senokot and MiraLAX with as needed Dulcolax.  AKI on CKD stage III AKI secondary to hypertensive emergency/ACE inhibitor use. Baseline creatinine of about 1.1. Peak creatinine of 1.42. benazepril held. Back towards baseline initially with acute worsening.  No hydronephrosis on renal ultrasound. Possibly some associated ATN. -Creatinine 1.43 today  Hypokalemia -Replace.  Repeat a.m. labs  Chronic diastolic heart  dysfunction -Stable.  Primary hypertension Hypertensive emergency on admission. Blood pressure better controlled now but still elevated at times. -Continue atenolol and diltiazem  Anemia Normocytic. Anemia panel suggests possible mixed picture. Stable. No evidence of bleed. In setting of diarrhea as well as chronic kidney disease.   Diabetes mellitus, type 2 Uncontrolled with hyperglycemia and hypoglycemia Patient is on Lantus 40 units qhs and Januemet as an outpatient. -Continue Lantus 20 units daily and continue SSI  Bicuspid aortic valve Moderate aortic valve stenosis Associated LVH without evidence of LV systolic dysfunction. No LV dilation. -Cardiology follow-up outpatient  Urinary retention Bladder scan on 11/11 was significant for 674mL+ of retained urine. Continued urinary retention and patient continued to refuse foley placement until 11/14. Foley placed successfully on 11/14 -will need outpatient urology evaluation  Chest pain Likely reflux related but patient does have evidence of cholelithiasis with stone located in the neck of the gallbladder. Chest pain resolved -Continue Protonix   DVT prophylaxis: Lovenox Code Status:   Code Status: Full Code Family Communication: None at bedside Disposition Plan: Status is: Inpatient  Remains inpatient appropriate because:Inpatient level of care appropriate due to severity of illness.  Discharge to SNF once cleared by nephrology   Dispo: The patient is from: Home              Anticipated d/c is to: SNF              Anticipated d/c date is: > 3 days              Patient currently is not medically stable to d/c.   Consultants: Nephrology  Procedures: Echo showed EF of 65 to 70% with grade 1 diastolic dysfunction  Antimicrobials: None   Subjective: Patient seen and examined at bedside.  Very poor historian; sleepy,  wakes up slightly, does not participate in conversation much.  No overnight fever, vomiting,  worsening shortness of breath reported. Objective: Vitals:   01/14/20 1028 01/14/20 1338 01/14/20 2041 01/15/20 0502  BP: (!) 147/88 (!) 144/88 140/73 (!) 155/81  Pulse: 72 68 74 71  Resp: 18 17 20 18   Temp: 98.3 F (36.8 C) (!) 97.4 F (36.3 C) 98.2 F (36.8 C) 97.7 F (36.5 C)  TempSrc: Oral Axillary Oral Oral  SpO2: (!) 88% 100% 100%   Weight:    124.7 kg  Height:        Intake/Output Summary (Last 24 hours) at 01/15/2020 0759 Last data filed at 01/15/2020 0659 Gross per 24 hour  Intake 596.98 ml  Output 3200 ml  Net -2603.02 ml   Filed Weights   01/13/20 0500 01/14/20 0426 01/15/20 0502  Weight: 126 kg 126.3 kg 124.7 kg    Examination:  General exam: Extremely poor historian.  No distress. Respiratory system: Bilateral decreased breath sounds at bases with some crackles, no wheezing  cardiovascular system: Rate controlled, S1-S2 heard  gastrointestinal system: Abdomen is morbidly obese, still distended, soft and nontender.  Normal bowel sounds heard extremities: Bilateral lower extremity edema present.  No cyanosis Central nervous system: Very poor historian; sleepy, wakes up slightly, does not participate in conversation much.    No focal neurological deficits.  Moves extremities Skin: No obvious ecchymosis/lesions psychiatry: Affect is very flat  Data Reviewed: I have personally reviewed following labs and imaging studies  CBC: Recent Labs  Lab 01/11/20 0239 01/13/20 0706 01/14/20 0507  WBC 3.5* 3.5* 4.4  NEUTROABS  --  1.9 2.3  HGB 9.1* 10.2* 9.9*  HCT 29.0* 33.4* 31.7*  MCV 81.0 81.1 80.7  PLT 285 310 893   Basic Metabolic Panel: Recent Labs  Lab 01/09/20 0321 01/09/20 0321 01/09/20 1027 01/09/20 1027 01/10/20 0409 01/11/20 0239 01/13/20 0706 01/14/20 0507 01/15/20 0215  NA 135   < > 134*   < > 135 135 136 137 138  K 4.2   < > 4.6   < > 4.5 4.0 3.1* 3.0* 2.7*  CL 102   < > 100   < > 104 103 100 104 102  CO2 24   < > 22   < > 22 23 25 24  25   GLUCOSE 101*   < > 100*   < > 103* 114* 180* 130* 135*  BUN 28*   < > 30*   < > 33* 37* 41* 42* 39*  CREATININE 1.94*   < > 2.18*   < > 2.31* 2.11* 1.88* 1.73* 1.43*  CALCIUM 9.2   < > 9.0   < > 9.1 8.9 9.0 9.0 8.9  MG  --   --  2.0  --   --   --   --  2.1  --   PHOS 5.2*  --   --   --   --   --   --  5.0* 3.9   < > = values in this interval not displayed.   GFR: Estimated Creatinine Clearance: 72.4 mL/min (A) (by C-G formula based on SCr of 1.43 mg/dL (H)). Liver Function Tests: Recent Labs  Lab 01/09/20 0321 01/09/20 1027 01/14/20 0507 01/15/20 0215  AST  --  49*  --   --   ALT  --  35  --   --   ALKPHOS  --  94  --   --   BILITOT  --  0.6  --   --  PROT  --  6.9  --   --   ALBUMIN 2.9* 2.9* 2.5* 2.2*   No results for input(s): LIPASE, AMYLASE in the last 168 hours. Recent Labs  Lab 01/11/20 1053  AMMONIA 35   Coagulation Profile: No results for input(s): INR, PROTIME in the last 168 hours. Cardiac Enzymes: No results for input(s): CKTOTAL, CKMB, CKMBINDEX, TROPONINI in the last 168 hours. BNP (last 3 results) No results for input(s): PROBNP in the last 8760 hours. HbA1C: No results for input(s): HGBA1C in the last 72 hours. CBG: Recent Labs  Lab 01/14/20 0759 01/14/20 1122 01/14/20 1614 01/14/20 2111 01/15/20 0736  GLUCAP 115* 176* 140* 186* 120*   Lipid Profile: No results for input(s): CHOL, HDL, LDLCALC, TRIG, CHOLHDL, LDLDIRECT in the last 72 hours. Thyroid Function Tests: No results for input(s): TSH, T4TOTAL, FREET4, T3FREE, THYROIDAB in the last 72 hours. Anemia Panel: No results for input(s): VITAMINB12, FOLATE, FERRITIN, TIBC, IRON, RETICCTPCT in the last 72 hours. Sepsis Labs: No results for input(s): PROCALCITON, LATICACIDVEN in the last 168 hours.  Recent Results (from the past 240 hour(s))  Gastrointestinal Panel by PCR , Stool     Status: None   Collection Time: 01/06/20 12:26 PM   Specimen: Stool  Result Value Ref Range Status    Campylobacter species NOT DETECTED NOT DETECTED Final   Plesimonas shigelloides NOT DETECTED NOT DETECTED Final   Salmonella species NOT DETECTED NOT DETECTED Final   Yersinia enterocolitica NOT DETECTED NOT DETECTED Final   Vibrio species NOT DETECTED NOT DETECTED Final   Vibrio cholerae NOT DETECTED NOT DETECTED Final   Enteroaggregative E coli (EAEC) NOT DETECTED NOT DETECTED Final   Enteropathogenic E coli (EPEC) NOT DETECTED NOT DETECTED Final   Enterotoxigenic E coli (ETEC) NOT DETECTED NOT DETECTED Final   Shiga like toxin producing E coli (STEC) NOT DETECTED NOT DETECTED Final   Shigella/Enteroinvasive E coli (EIEC) NOT DETECTED NOT DETECTED Final   Cryptosporidium NOT DETECTED NOT DETECTED Final   Cyclospora cayetanensis NOT DETECTED NOT DETECTED Final   Entamoeba histolytica NOT DETECTED NOT DETECTED Final   Giardia lamblia NOT DETECTED NOT DETECTED Final   Adenovirus F40/41 NOT DETECTED NOT DETECTED Final   Astrovirus NOT DETECTED NOT DETECTED Final   Norovirus GI/GII NOT DETECTED NOT DETECTED Final   Rotavirus A NOT DETECTED NOT DETECTED Final   Sapovirus (I, II, IV, and V) NOT DETECTED NOT DETECTED Final    Comment: Performed at [facility], [address] [place], [place] [address]  Culture, Urine     Status: None   Collection Time: 01/09/20  3:45 PM   Specimen: Urine, Catheterized  Result Value Ref Range Status   Specimen Description URINE, CATHETERIZED  Final   Special Requests NONE  Final   Culture   Final    NO GROWTH Performed at [facility], [address] [place], [place] [address]    Report Status 01/10/2020 FINAL  Final         Radiology Studies: DG Abd Portable 1V  Result Date: 01/13/2020 CLINICAL DATA:  Abdominal distention. EXAM: PORTABLE ABDOMEN - 1 VIEW COMPARISON:  Limb 1421 FINDINGS: Diffuse gaseous bowel distention noted, mainly colon, similar to prior. No unexpected abdominopelvic calcification. IMPRESSION: Persistent  diffuse gaseous colonic distention, similar to prior. Electronically Signed   By: Misty Stanley M.D.   On: 01/13/2020 10:58        Scheduled Meds: . atenolol  25 mg Oral Daily  . Chlorhexidine Gluconate  Cloth  6 each Topical Daily  . diltiazem  180 mg Oral Daily  . enoxaparin (LOVENOX) injection  60 mg Subcutaneous Daily  . feeding supplement  237 mL Oral TID BM  . gabapentin  300 mg Oral BID  . insulin aspart  0-5 Units Subcutaneous QHS  . insulin aspart  0-9 Units Subcutaneous TID WC  . insulin glargine  20 Units Subcutaneous Daily  . multivitamin with minerals  1 tablet Oral Daily  . oxyCODONE-acetaminophen  1 tablet Oral Once  . pantoprazole  40 mg Oral Daily  . phenazopyridine  100 mg Oral TID WC  . polyethylene glycol  17 g Oral BID  . potassium chloride  40 mEq Oral Q6H  . senna-docusate  1 tablet Oral BID  . sodium chloride flush  3 mL Intravenous Q12H  . sodium chloride flush  3 mL Intravenous Q12H  . sorbitol, milk of mag, mineral oil, glycerin (SMOG) enema  960 mL Rectal Once  . tamsulosin  0.4 mg Oral Daily   Continuous Infusions: . sodium chloride    . furosemide (LASIX) 200 mg in dextrose 5% 100 mL (2mg /mL) infusion 10 mg/hr (01/13/20 2142)  . iron sucrose 100 mg (01/14/20 1146)          Aline August, MD [facility] 01/15/2020, 7:59 AM

## 2020-01-15 NOTE — Progress Notes (Signed)
CRITICAL VALUE ALERT  Critical Value: K 2.7  Date & Time Notied: 01/15/2020 @ 2300  Provider Notified: Dr. Myna Hidalgo  Orders Received/Actions taken 40 mEq and 10 Meq

## 2020-01-15 NOTE — Progress Notes (Signed)
Patient states that he is a "high sugar diabetic" and that a good range for his blood sugar is 200-300. He states that if his blood sugar drops below 120 that he will get sick. Refusing some insulin doses secondary to this.

## 2020-01-15 NOTE — Progress Notes (Signed)
[facility] ROUNDING NOTE   Subjective:   Brief history  59 year old gentleman with history of hypertension poorly controlled diabetes neuropathy retinopathy hemoglobin A1c 10.6 history of CVA history of congestive heart failure admitted 12/31/2019 with hypertensive emergency hyperkalemia proteinuria and elevated creatinine.  Baseline creatinine about 1.1 mg/dL.  Has nephrotic range proteinuria secondary diabetic nephropathy.  He has an unremarkable serological evaluation.  Including hepatitis B hepatitis C HIV ANA and kappa lambda light chain ratio.  Renal ultrasound showed no obstruction.  He was started on IV Lasix now has rising creatinine.  He is refusing Foley catheter we are holding his ACE inhibitor and ARB.  Repeat ultrasound shows relative distention of urinary bladder.  Blood pressure 155/81 pulse 71 temperature 97.7 O2 sats 100%  Urine output 1 L 01/14/2020  Sodium 138 potassium 2.7 chloride 102 CO2 25 BUN 39 creatinine 1.43 glucose 135 calcium 8.9 hemoglobin 9.9   Objective:  Vital signs in last 24 hours:  Temp:  [97.4 F (36.3 C)-98.3 F (36.8 C)] 97.7 F (36.5 C) (11/20 0502) Pulse Rate:  [68-74] 71 (11/20 0502) Resp:  [17-20] 18 (11/20 0502) BP: (140-155)/(73-88) 155/81 (11/20 0502) SpO2:  [88 %-100 %] 100 % (11/19 2041) Weight:  [124.7 kg] 124.7 kg (11/20 0502)  Weight change: -1.6 kg Filed Weights   01/13/20 0500 01/14/20 0426 01/15/20 0502  Weight: 126 kg 126.3 kg 124.7 kg    Intake/Output: I/O last 3 completed shifts: In: 600 [P.O.:240; I.V.:255; IV Piggyback:105] Out: 0865 [Urine:3950]   Intake/Output this shift:  No intake/output data recorded.  General:NAD, comfortable Heart:RRR, s1s2 nl Lungs:clear b/l, no crackle Abdomen:soft, Non-tender, distended. Extremities: Bilateral lower extremities pitting edema++, not much improvement Neurology: Alert awake and following commands  Basic Metabolic Panel: Recent Labs  Lab 01/09/20 0321  01/09/20 0321 01/09/20 1027 01/09/20 1027 01/10/20 0409 01/10/20 0409 01/11/20 0239 01/11/20 0239 01/13/20 0706 01/14/20 0507 01/15/20 0215  NA 135   < > 134*   < > 135  --  135  --  136 137 138  K 4.2   < > 4.6   < > 4.5  --  4.0  --  3.1* 3.0* 2.7*  CL 102   < > 100   < > 104  --  103  --  100 104 102  CO2 24   < > 22   < > 22  --  23  --  25 24 25   GLUCOSE 101*   < > 100*   < > 103*  --  114*  --  180* 130* 135*  BUN 28*   < > 30*   < > 33*  --  37*  --  41* 42* 39*  CREATININE 1.94*   < > 2.18*   < > 2.31*  --  2.11*  --  1.88* 1.73* 1.43*  CALCIUM 9.2   < > 9.0   < > 9.1   < > 8.9   < > 9.0 9.0 8.9  MG  --   --  2.0  --   --   --   --   --   --  2.1  --   PHOS 5.2*  --   --   --   --   --   --   --   --  5.0* 3.9   < > = values in this interval not displayed.    Liver Function Tests: Recent Labs  Lab 01/09/20 0321 01/09/20 1027 01/14/20 0507  01/15/20 0215  AST  --  49*  --   --   ALT  --  35  --   --   ALKPHOS  --  94  --   --   BILITOT  --  0.6  --   --   PROT  --  6.9  --   --   ALBUMIN 2.9* 2.9* 2.5* 2.2*   No results for input(s): LIPASE, AMYLASE in the last 168 hours. Recent Labs  Lab 01/11/20 1053  AMMONIA 35    CBC: Recent Labs  Lab 01/11/20 0239 01/13/20 0706 01/14/20 0507  WBC 3.5* 3.5* 4.4  NEUTROABS  --  1.9 2.3  HGB 9.1* 10.2* 9.9*  HCT 29.0* 33.4* 31.7*  MCV 81.0 81.1 80.7  PLT 285 310 280    Cardiac Enzymes: No results for input(s): CKTOTAL, CKMB, CKMBINDEX, TROPONINI in the last 168 hours.  BNP: Invalid input(s): POCBNP  CBG: Recent Labs  Lab 01/13/20 2236 01/14/20 0759 01/14/20 1122 01/14/20 1614 01/14/20 2111  GLUCAP 137* 115* 176* 140* 186*    Microbiology: Results for orders placed or performed during the hospital encounter of 12/31/19  Respiratory Panel by RT PCR (Flu A&B, Covid) - Nasopharyngeal Swab     Status: None   Collection Time: 12/31/19  2:00 PM   Specimen: Nasopharyngeal Swab  Result Value Ref Range  Status   SARS Coronavirus 2 by RT PCR NEGATIVE NEGATIVE Final    Comment: (NOTE) SARS-CoV-2 target nucleic acids are NOT DETECTED.  The SARS-CoV-2 RNA is generally detectable in upper respiratoy specimens during the acute phase of infection. The lowest concentration of SARS-CoV-2 viral copies this assay can detect is 131 copies/mL. A negative result does not preclude SARS-Cov-2 infection and should not be used as the sole basis for treatment or other patient management decisions. A negative result may occur with  improper specimen collection/handling, submission of specimen other than nasopharyngeal swab, presence of viral mutation(s) within the areas targeted by this assay, and inadequate number of viral copies (<131 copies/mL). A negative result must be combined with clinical observations, patient history, and epidemiological information. The expected result is Negative.  Fact Sheet for Patients:  PinkCheek.be  Fact Sheet for Healthcare Providers:  GravelBags.it  This test is no t yet approved or cleared by the [place] FDA and  has been authorized for detection and/or diagnosis of SARS-CoV-2 by FDA under an Emergency Use Authorization (EUA). This EUA will remain  in effect (meaning this test can be used) for the duration of the COVID-19 declaration under Section 564(b)(1) of the Act, 21 U.S.C. section 360bbb-3(b)(1), unless the authorization is terminated or revoked sooner.     Influenza A by PCR NEGATIVE NEGATIVE Final   Influenza B by PCR NEGATIVE NEGATIVE Final    Comment: (NOTE) The Xpert Xpress SARS-CoV-2/FLU/RSV assay is intended as an aid in  the diagnosis of influenza from Nasopharyngeal swab specimens and  should not be used as a sole basis for treatment. Nasal washings and  aspirates are unacceptable for Xpert Xpress SARS-CoV-2/FLU/RSV  testing.  Fact Sheet for  Patients: PinkCheek.be  Fact Sheet for Healthcare Providers: GravelBags.it  This test is not yet approved or cleared by the [place] FDA and  has been authorized for detection and/or diagnosis of SARS-CoV-2 by  FDA under an Emergency Use Authorization (EUA). This EUA will remain  in effect (meaning this test can be used) for the duration of the  Covid-19 declaration under Section 564(b)(1) of the  Act, 21  U.S.C. section 360bbb-3(b)(1), unless the authorization is  terminated or revoked. Performed at [facility], [place] [address], [place], [place] [address]   SARS Coronavirus 2 by RT PCR (hospital order, performed in [facility] hospital lab) Nasopharyngeal Nasopharyngeal Swab     Status: None   Collection Time: 01/03/20  5:09 PM   Specimen: Nasopharyngeal Swab  Result Value Ref Range Status   SARS Coronavirus 2 NEGATIVE NEGATIVE Final    Comment: (NOTE) SARS-CoV-2 target nucleic acids are NOT DETECTED.  The SARS-CoV-2 RNA is generally detectable in upper and lower respiratory specimens during the acute phase of infection. The lowest concentration of SARS-CoV-2 viral copies this assay can detect is 250 copies / mL. A negative result does not preclude SARS-CoV-2 infection and should not be used as the sole basis for treatment or other patient management decisions.  A negative result may occur with improper specimen collection / handling, submission of specimen other than nasopharyngeal swab, presence of viral mutation(s) within the areas targeted by this assay, and inadequate number of viral copies (<250 copies / mL). A negative result must be combined with clinical observations, patient history, and epidemiological information.  Fact Sheet for Patients:   StrictlyIdeas.no  Fact Sheet for Healthcare Providers: BankingDealers.co.za  This test is not yet approved or   cleared by the [place] FDA and has been authorized for detection and/or diagnosis of SARS-CoV-2 by FDA under an Emergency Use Authorization (EUA).  This EUA will remain in effect (meaning this test can be used) for the duration of the COVID-19 declaration under Section 564(b)(1) of the Act, 21 U.S.C. section 360bbb-3(b)(1), unless the authorization is terminated or revoked sooner.  Performed at [facility], [place] [address] [place], [place] [address]   Gastrointestinal Panel by PCR , Stool     Status: None   Collection Time: 01/06/20 12:26 PM   Specimen: Stool  Result Value Ref Range Status   Campylobacter species NOT DETECTED NOT DETECTED Final   Plesimonas shigelloides NOT DETECTED NOT DETECTED Final   Salmonella species NOT DETECTED NOT DETECTED Final   Yersinia enterocolitica NOT DETECTED NOT DETECTED Final   Vibrio species NOT DETECTED NOT DETECTED Final   Vibrio cholerae NOT DETECTED NOT DETECTED Final   Enteroaggregative E coli (EAEC) NOT DETECTED NOT DETECTED Final   Enteropathogenic E coli (EPEC) NOT DETECTED NOT DETECTED Final   Enterotoxigenic E coli (ETEC) NOT DETECTED NOT DETECTED Final   Shiga like toxin producing E coli (STEC) NOT DETECTED NOT DETECTED Final   Shigella/Enteroinvasive E coli (EIEC) NOT DETECTED NOT DETECTED Final   Cryptosporidium NOT DETECTED NOT DETECTED Final   Cyclospora cayetanensis NOT DETECTED NOT DETECTED Final   Entamoeba histolytica NOT DETECTED NOT DETECTED Final   Giardia lamblia NOT DETECTED NOT DETECTED Final   Adenovirus F40/41 NOT DETECTED NOT DETECTED Final   Astrovirus NOT DETECTED NOT DETECTED Final   Norovirus GI/GII NOT DETECTED NOT DETECTED Final   Rotavirus A NOT DETECTED NOT DETECTED Final   Sapovirus (I, II, IV, and V) NOT DETECTED NOT DETECTED Final    Comment: Performed at [facility], [place] [address] [place], [place] [address]  Culture, Urine     Status: None   Collection Time: 01/09/20  3:45 PM    Specimen: Urine, Catheterized  Result Value Ref Range Status   Specimen Description URINE, CATHETERIZED  Final   Special Requests NONE  Final   Culture   Final  NO GROWTH Performed at [facility], [place] [address], [place], [place] [address]    Report Status 01/10/2020 FINAL  Final    Coagulation Studies: No results for input(s): LABPROT, INR in the last 72 hours.  Urinalysis: No results for input(s): COLORURINE, LABSPEC, PHURINE, GLUCOSEU, HGBUR, BILIRUBINUR, KETONESUR, PROTEINUR, UROBILINOGEN, NITRITE, LEUKOCYTESUR in the last 72 hours.  Invalid input(s): APPERANCEUR    Imaging: DG Abd Portable 1V  Result Date: 01/13/2020 CLINICAL DATA:  Abdominal distention. EXAM: PORTABLE ABDOMEN - 1 VIEW COMPARISON:  Limb 1421 FINDINGS: Diffuse gaseous bowel distention noted, mainly colon, similar to prior. No unexpected abdominopelvic calcification. IMPRESSION: Persistent diffuse gaseous colonic distention, similar to prior. Electronically Signed   By: Misty Stanley M.D.   On: 01/13/2020 10:58     Medications:   . sodium chloride    . furosemide (LASIX) 200 mg in dextrose 5% 100 mL (2mg /mL) infusion 10 mg/hr (01/13/20 2142)  . iron sucrose 100 mg (01/14/20 1146)   . atenolol  25 mg Oral Daily  . Chlorhexidine Gluconate Cloth  6 each Topical Daily  . diltiazem  180 mg Oral Daily  . enoxaparin (LOVENOX) injection  60 mg Subcutaneous Daily  . feeding supplement  237 mL Oral TID BM  . gabapentin  300 mg Oral BID  . insulin aspart  0-5 Units Subcutaneous QHS  . insulin aspart  0-9 Units Subcutaneous TID WC  . insulin glargine  20 Units Subcutaneous Daily  . multivitamin with minerals  1 tablet Oral Daily  . oxyCODONE-acetaminophen  1 tablet Oral Once  . pantoprazole  40 mg Oral Daily  . phenazopyridine  100 mg Oral TID WC  . polyethylene glycol  17 g Oral BID  . potassium chloride  40 mEq Oral Q6H  . senna-docusate  1 tablet Oral BID  . sodium chloride flush  3 mL Intravenous  Q12H  . sodium chloride flush  3 mL Intravenous Q12H  . sorbitol, milk of mag, mineral oil, glycerin (SMOG) enema  960 mL Rectal Once  . tamsulosin  0.4 mg Oral Daily   sodium chloride, acetaminophen **OR** acetaminophen, bisacodyl, hydrALAZINE, LORazepam, nitroGLYCERIN, ondansetron **OR** ondansetron (ZOFRAN) IV, oxyCODONE-acetaminophen, sodium chloride flush  Assessment/ Plan:  1.Nephrotic syndrome likely due to diabetic nephropathy: He has severe hypoalbuminemia associated with edema and urine PCR 3.5 g. Urinalysis has glucosuria and proteinuria without hematuria.  Hep B, hep C, HIV, ANA and kappa lambda ratio unremarkable. Kidney ultrasound with normal cortical thickness and without obstruction. The bladder scan showed urinary retention and patient declined insertion of Foley catheter.  He is still refusing Foley catheter. I will continue to hold ACE inhibitor or ARB while diuresing however this needs to be started after maintaining euvolemia.  Continues to have good urine output.   2.Acute kidney injury due to hypertensive emergency and hemodynamic change related with ACE inhibitor. 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 Management as above. Patient appears to be having good response to IV diuretics. Need to rule out obstruction as discussed above. Avoid nephrotoxins including NSAIDs or IV contrast.  Probably has a degree of acute tubular necrosis.  Creatinine appears to be improving.  Baseline creatinine appears to be within normal range.  3.Bilateral lower extremity edema: diuretics as above.  4.Hypokalemia due to diuretics:  Will replete  5.Hypertensive improved blood pressure control  6.anemia T sats 13%.  IV iron administered   LOS: [place] @TODAY @7 :08 AM

## 2020-01-16 ENCOUNTER — Encounter (HOSPITAL_COMMUNITY): Payer: Self-pay | Admitting: Internal Medicine

## 2020-01-16 ENCOUNTER — Inpatient Hospital Stay (HOSPITAL_COMMUNITY): Payer: Medicare HMO

## 2020-01-16 DIAGNOSIS — N179 Acute kidney failure, unspecified: Secondary | ICD-10-CM | POA: Diagnosis not present

## 2020-01-16 DIAGNOSIS — N049 Nephrotic syndrome with unspecified morphologic changes: Secondary | ICD-10-CM | POA: Diagnosis not present

## 2020-01-16 DIAGNOSIS — F1999 Other psychoactive substance use, unspecified with unspecified psychoactive substance-induced disorder: Secondary | ICD-10-CM | POA: Diagnosis not present

## 2020-01-16 DIAGNOSIS — E876 Hypokalemia: Secondary | ICD-10-CM | POA: Diagnosis not present

## 2020-01-16 LAB — CBC WITH DIFFERENTIAL/PLATELET
Abs Immature Granulocytes: 0.01 10*3/uL (ref 0.00–0.07)
Basophils Absolute: 0 10*3/uL (ref 0.0–0.1)
Basophils Relative: 0 %
Eosinophils Absolute: 0.2 10*3/uL (ref 0.0–0.5)
Eosinophils Relative: 5 %
HCT: 31 % — ABNORMAL LOW (ref 39.0–52.0)
Hemoglobin: 9.8 g/dL — ABNORMAL LOW (ref 13.0–17.0)
Immature Granulocytes: 0 %
Lymphocytes Relative: 25 %
Lymphs Abs: 1.2 10*3/uL (ref 0.7–4.0)
MCH: 25.8 pg — ABNORMAL LOW (ref 26.0–34.0)
MCHC: 31.6 g/dL (ref 30.0–36.0)
MCV: 81.6 fL (ref 80.0–100.0)
Monocytes Absolute: 0.7 10*3/uL (ref 0.1–1.0)
Monocytes Relative: 16 %
Neutro Abs: 2.5 10*3/uL (ref 1.7–7.7)
Neutrophils Relative %: 54 %
Platelets: 303 10*3/uL (ref 150–400)
RBC: 3.8 MIL/uL — ABNORMAL LOW (ref 4.22–5.81)
RDW: 15.3 % (ref 11.5–15.5)
WBC: 4.6 10*3/uL (ref 4.0–10.5)
nRBC: 0 % (ref 0.0–0.2)

## 2020-01-16 LAB — BASIC METABOLIC PANEL
Anion gap: 17 — ABNORMAL HIGH (ref 5–15)
BUN: 25 mg/dL — ABNORMAL HIGH (ref 6–20)
CO2: 23 mmol/L (ref 22–32)
Calcium: 9.2 mg/dL (ref 8.9–10.3)
Chloride: 98 mmol/L (ref 98–111)
Creatinine, Ser: 1.24 mg/dL (ref 0.61–1.24)
GFR, Estimated: >60 mL/min (ref >60)
Glucose, Bld: 191 mg/dL — ABNORMAL HIGH (ref 70–99)
Potassium: 2.4 mmol/L — CL (ref 3.5–5.1)
Sodium: 138 mmol/L (ref 135–145)

## 2020-01-16 LAB — RENAL FUNCTION PANEL
Albumin: 2.1 g/dL — ABNORMAL LOW (ref 3.5–5.0)
Anion gap: 8 (ref 5–15)
BUN: 29 mg/dL — ABNORMAL HIGH (ref 6–20)
CO2: 28 mmol/L (ref 22–32)
Calcium: 9 mg/dL (ref 8.9–10.3)
Chloride: 103 mmol/L (ref 98–111)
Creatinine, Ser: 1.26 mg/dL — ABNORMAL HIGH (ref 0.61–1.24)
GFR, Estimated: >60 mL/min (ref >60)
Glucose, Bld: 126 mg/dL — ABNORMAL HIGH (ref 70–99)
Phosphorus: 3.5 mg/dL (ref 2.5–4.6)
Potassium: 2.4 mmol/L — CL (ref 3.5–5.1)
Sodium: 139 mmol/L (ref 135–145)

## 2020-01-16 LAB — GLUCOSE, CAPILLARY
Glucose-Capillary: 136 mg/dL — ABNORMAL HIGH (ref 70–99)
Glucose-Capillary: 168 mg/dL — ABNORMAL HIGH (ref 70–99)
Glucose-Capillary: 170 mg/dL — ABNORMAL HIGH (ref 70–99)
Glucose-Capillary: 184 mg/dL — ABNORMAL HIGH (ref 70–99)

## 2020-01-16 MED ORDER — ACETAMINOPHEN 650 MG RE SUPP: 650.0000 mg | Freq: Four times a day (QID) | RECTAL | Status: DC | PRN

## 2020-01-16 MED ORDER — FUROSEMIDE 80 MG PO TABS
80.0000 mg | ORAL_TABLET | Freq: Two times a day (BID) | ORAL | Status: DC
  Filled 2020-01-16: qty 1

## 2020-01-16 MED ORDER — LIP MEDEX EX OINT
1.0000 "application " | TOPICAL_OINTMENT | CUTANEOUS | Status: DC | PRN
  Filled 2020-01-16: qty 7

## 2020-01-16 MED ORDER — MORPHINE SULFATE (PF) 2 MG/ML IV SOLN
2.0000 mg | INTRAVENOUS | Status: DC | PRN
  Administered 2020-01-16 (×2): 2 mg via INTRAVENOUS
  Filled 2020-01-16 (×2): qty 1

## 2020-01-16 MED ORDER — POTASSIUM CHLORIDE CRYS ER 20 MEQ PO TBCR
40.0000 meq | EXTENDED_RELEASE_TABLET | Freq: Four times a day (QID) | ORAL | Status: DC
  Filled 2020-01-16 (×2): qty 2

## 2020-01-16 MED ORDER — IOHEXOL 300 MG/ML  SOLN
100.0000 mL | Freq: Once | INTRAMUSCULAR | Status: AC | PRN
  Administered 2020-01-16: 100 mL via INTRAVENOUS

## 2020-01-16 MED ORDER — POTASSIUM CHLORIDE 10 MEQ/100ML IV SOLN
INTRAVENOUS | Status: AC
  Administered 2020-01-16: 10 meq via INTRAVENOUS
  Filled 2020-01-16: qty 100

## 2020-01-16 MED ORDER — ACETAMINOPHEN 10 MG/ML IV SOLN
1000.0000 mg | Freq: Once | INTRAVENOUS | Status: AC | PRN
  Administered 2020-01-16: 1000 mg via INTRAVENOUS
  Filled 2020-01-16: qty 100

## 2020-01-16 MED ORDER — FENTANYL CITRATE (PF) 100 MCG/2ML IJ SOLN
25.0000 ug | INTRAMUSCULAR | Status: DC | PRN
  Administered 2020-01-17 (×2): 25 ug via INTRAVENOUS
  Administered 2020-01-18: 50 ug via INTRAVENOUS
  Administered 2020-01-18 – 2020-01-19 (×7): 25 ug via INTRAVENOUS
  Administered 2020-01-20: 50 ug via INTRAVENOUS
  Administered 2020-01-20: 25 ug via INTRAVENOUS
  Administered 2020-01-21: 50 ug via INTRAVENOUS
  Filled 2020-01-16 (×14): qty 2

## 2020-01-16 MED ORDER — ALUM & MAG HYDROXIDE-SIMETH 200-200-20 MG/5ML PO SUSP
30.0000 mL | ORAL | Status: DC | PRN
  Administered 2020-01-16 – 2020-01-30 (×2): 30 mL via ORAL
  Filled 2020-01-16 (×2): qty 30

## 2020-01-16 MED ORDER — POTASSIUM CHLORIDE 10 MEQ/100ML IV SOLN
10.0000 meq | INTRAVENOUS | Status: DC
  Administered 2020-01-16: 10 meq via INTRAVENOUS
  Filled 2020-01-16: qty 100

## 2020-01-16 MED ORDER — POTASSIUM CHLORIDE 10 MEQ/100ML IV SOLN
10.0000 meq | INTRAVENOUS | Status: AC
  Administered 2020-01-16 (×5): 10 meq via INTRAVENOUS
  Filled 2020-01-16 (×5): qty 100

## 2020-01-16 MED ORDER — FUROSEMIDE 10 MG/ML IJ SOLN
80.0000 mg | Freq: Two times a day (BID) | INTRAMUSCULAR | Status: DC
  Administered 2020-01-16: 80 mg via INTRAVENOUS
  Filled 2020-01-16: qty 8

## 2020-01-16 MED ORDER — FENTANYL CITRATE (PF) 100 MCG/2ML IJ SOLN
25.0000 ug | INTRAMUSCULAR | Status: DC | PRN
  Filled 2020-01-16: qty 2

## 2020-01-16 MED ORDER — POTASSIUM CHLORIDE 10 MEQ/100ML IV SOLN
10.0000 meq | INTRAVENOUS | Status: AC
  Administered 2020-01-16 – 2020-01-17 (×5): 10 meq via INTRAVENOUS
  Filled 2020-01-16 (×4): qty 100

## 2020-01-16 MED ORDER — ACETAMINOPHEN 325 MG PO TABS
650.0000 mg | ORAL_TABLET | Freq: Four times a day (QID) | ORAL | Status: DC | PRN
  Administered 2020-01-17 – 2020-01-31 (×3): 650 mg via ORAL
  Filled 2020-01-16 (×4): qty 2

## 2020-01-16 MED ORDER — POTASSIUM CHLORIDE CRYS ER 20 MEQ PO TBCR
40.0000 meq | EXTENDED_RELEASE_TABLET | ORAL | Status: DC
  Filled 2020-01-16: qty 2

## 2020-01-16 MED ORDER — POLYETHYLENE GLYCOL 3350 17 G PO PACK: 17.0000 g | PACK | Freq: Every day | ORAL | Status: DC | PRN

## 2020-01-16 NOTE — Progress Notes (Signed)
[facility] ROUNDING NOTE   Subjective:   Brief history  59 year old gentleman with history of hypertension poorly controlled diabetes neuropathy retinopathy hemoglobin A1c 10.6 history of CVA history of congestive heart failure admitted 12/31/2019 with hypertensive emergency hyperkalemia proteinuria and elevated creatinine.  Baseline creatinine about 1.1 mg/dL.  Has nephrotic range proteinuria secondary diabetic nephropathy.  He has an unremarkable serological evaluation.  Including hepatitis B hepatitis C HIV ANA and kappa lambda light chain ratio.  Renal ultrasound showed no obstruction.  He was started on IV Lasix now has rising creatinine.  He is refusing Foley catheter we are holding his ACE inhibitor and ARB.  Repeat ultrasound shows relative distention of urinary bladder.  Blood pressure 159/94 pulse 71 temperature 98  Urine output 6.3 L 01/15/2020  Sodium 139 potassium 2.4 chloride 103 CO2 28 BUN 29 creatinine 1.26 glucose 126 calcium 9 hemoglobin 9.8   Objective:  Vital signs in last 24 hours:  Temp:  [97.9 F (36.6 C)-98.2 F (36.8 C)] 98.2 F (36.8 C) (11/21 0636) Pulse Rate:  [72-76] 76 (11/21 0844) Resp:  [18-20] 18 (11/21 0636) BP: (155-164)/(90-94) 159/94 (11/21 0844) SpO2:  [99 %-100 %] 99 % (11/21 0636) Weight:  [110 kg] 110 kg (11/21 0636)  Weight change: -14.7 kg Filed Weights   01/14/20 0426 01/15/20 0502 01/16/20 0636  Weight: 126.3 kg 124.7 kg 110 kg    Intake/Output: I/O last 3 completed shifts: In: 480 [P.O.:480] Out: 7700 [Urine:7700]   Intake/Output this shift:  No intake/output data recorded.  General:NAD, comfortable Heart:RRR, s1s2 nl Lungs:clear b/l, no crackle Abdomen:soft, Non-tender, distended. Extremities: Bilateral lower extremities pitting edema++, not much improvement Neurology: Alert awake and following commands  Basic Metabolic Panel: Recent Labs  Lab 01/09/20 1027 01/10/20 0409 01/11/20 0239 01/11/20 0239  01/13/20 0706 01/13/20 0706 01/14/20 0507 01/15/20 0215 01/16/20 0236  NA 134*   < > 135  --  136  --  137 138 139  K 4.6   < > 4.0  --  3.1*  --  3.0* 2.7* 2.4*  CL 100   < > 103  --  100  --  104 102 103  CO2 22   < > 23  --  25  --  24 25 28   GLUCOSE 100*   < > 114*  --  180*  --  130* 135* 126*  BUN 30*   < > 37*  --  41*  --  42* 39* 29*  CREATININE 2.18*   < > 2.11*  --  1.88*  --  1.73* 1.43* 1.26*  CALCIUM 9.0   < > 8.9   < > 9.0   < > 9.0 8.9 9.0  MG 2.0  --   --   --   --   --  2.1  --   --   PHOS  --   --   --   --   --   --  5.0* 3.9 3.5   < > = values in this interval not displayed.    Liver Function Tests: Recent Labs  Lab 01/09/20 1027 01/14/20 0507 01/15/20 0215 01/16/20 0236  AST 49*  --   --   --   ALT 35  --   --   --   ALKPHOS 94  --   --   --   BILITOT 0.6  --   --   --   PROT 6.9  --   --   --  ALBUMIN 2.9* 2.5* 2.2* 2.1*   No results for input(s): LIPASE, AMYLASE in the last 168 hours. Recent Labs  Lab 01/11/20 1053  AMMONIA 35    CBC: Recent Labs  Lab 01/11/20 0239 01/13/20 0706 01/14/20 0507 01/16/20 0236  WBC 3.5* 3.5* 4.4 4.6  NEUTROABS  --  1.9 2.3 2.5  HGB 9.1* 10.2* 9.9* 9.8*  HCT 29.0* 33.4* 31.7* 31.0*  MCV 81.0 81.1 80.7 81.6  PLT 285 310 280 303    Cardiac Enzymes: No results for input(s): CKTOTAL, CKMB, CKMBINDEX, TROPONINI in the last 168 hours.  BNP: Invalid input(s): POCBNP  CBG: Recent Labs  Lab 01/15/20 0736 01/15/20 1110 01/15/20 1641 01/15/20 2137 01/16/20 0747  GLUCAP 120* 150* 156* 144* 136*    Microbiology: Results for orders placed or performed during the hospital encounter of 12/31/19  Respiratory Panel by RT PCR (Flu A&B, Covid) - Nasopharyngeal Swab     Status: None   Collection Time: 12/31/19  2:00 PM   Specimen: Nasopharyngeal Swab  Result Value Ref Range Status   SARS Coronavirus 2 by RT PCR NEGATIVE NEGATIVE Final    Comment: (NOTE) SARS-CoV-2 target nucleic acids are NOT  DETECTED.  The SARS-CoV-2 RNA is generally detectable in upper respiratoy specimens during the acute phase of infection. The lowest concentration of SARS-CoV-2 viral copies this assay can detect is 131 copies/mL. A negative result does not preclude SARS-Cov-2 infection and should not be used as the sole basis for treatment or other patient management decisions. A negative result may occur with  improper specimen collection/handling, submission of specimen other than nasopharyngeal swab, presence of viral mutation(s) within the areas targeted by this assay, and inadequate number of viral copies (<131 copies/mL). A negative result must be combined with clinical observations, patient history, and epidemiological information. The expected result is Negative.  Fact Sheet for Patients:  PinkCheek.be  Fact Sheet for Healthcare Providers:  GravelBags.it  This test is no t yet approved or cleared by the [place] FDA and  has been authorized for detection and/or diagnosis of SARS-CoV-2 by FDA under an Emergency Use Authorization (EUA). This EUA will remain  in effect (meaning this test can be used) for the duration of the COVID-19 declaration under Section 564(b)(1) of the Act, 21 U.S.C. section 360bbb-3(b)(1), unless the authorization is terminated or revoked sooner.     Influenza A by PCR NEGATIVE NEGATIVE Final   Influenza B by PCR NEGATIVE NEGATIVE Final    Comment: (NOTE) The Xpert Xpress SARS-CoV-2/FLU/RSV assay is intended as an aid in  the diagnosis of influenza from Nasopharyngeal swab specimens and  should not be used as a sole basis for treatment. Nasal washings and  aspirates are unacceptable for Xpert Xpress SARS-CoV-2/FLU/RSV  testing.  Fact Sheet for Patients: PinkCheek.be  Fact Sheet for Healthcare Providers: GravelBags.it  This test is not yet  approved or cleared by the [place] FDA and  has been authorized for detection and/or diagnosis of SARS-CoV-2 by  FDA under an Emergency Use Authorization (EUA). This EUA will remain  in effect (meaning this test can be used) for the duration of the  Covid-19 declaration under Section 564(b)(1) of the Act, 21  U.S.C. section 360bbb-3(b)(1), unless the authorization is  terminated or revoked. Performed at [facility], [place] [address], [place], [place] [address]   SARS Coronavirus 2 by RT PCR (hospital order, performed in [facility] hospital lab) Nasopharyngeal Nasopharyngeal Swab     Status: None   Collection  Time: 01/03/20  5:09 PM   Specimen: Nasopharyngeal Swab  Result Value Ref Range Status   SARS Coronavirus 2 NEGATIVE NEGATIVE Final    Comment: (NOTE) SARS-CoV-2 target nucleic acids are NOT DETECTED.  The SARS-CoV-2 RNA is generally detectable in upper and lower respiratory specimens during the acute phase of infection. The lowest concentration of SARS-CoV-2 viral copies this assay can detect is 250 copies / mL. A negative result does not preclude SARS-CoV-2 infection and should not be used as the sole basis for treatment or other patient management decisions.  A negative result may occur with improper specimen collection / handling, submission of specimen other than nasopharyngeal swab, presence of viral mutation(s) within the areas targeted by this assay, and inadequate number of viral copies (<250 copies / mL). A negative result must be combined with clinical observations, patient history, and epidemiological information.  Fact Sheet for Patients:   StrictlyIdeas.no  Fact Sheet for Healthcare Providers: BankingDealers.co.za  This test is not yet approved or  cleared by the [place] FDA and has been authorized for detection and/or diagnosis of SARS-CoV-2 by FDA under an Emergency Use Authorization (EUA).  This  EUA will remain in effect (meaning this test can be used) for the duration of the COVID-19 declaration under Section 564(b)(1) of the Act, 21 U.S.C. section 360bbb-3(b)(1), unless the authorization is terminated or revoked sooner.  Performed at [facility], [place] [address], [place], [place] [address]   Gastrointestinal Panel by PCR , Stool     Status: None   Collection Time: 01/06/20 12:26 PM   Specimen: Stool  Result Value Ref Range Status   Campylobacter species NOT DETECTED NOT DETECTED Final   Plesimonas shigelloides NOT DETECTED NOT DETECTED Final   Salmonella species NOT DETECTED NOT DETECTED Final   Yersinia enterocolitica NOT DETECTED NOT DETECTED Final   Vibrio species NOT DETECTED NOT DETECTED Final   Vibrio cholerae NOT DETECTED NOT DETECTED Final   Enteroaggregative E coli (EAEC) NOT DETECTED NOT DETECTED Final   Enteropathogenic E coli (EPEC) NOT DETECTED NOT DETECTED Final   Enterotoxigenic E coli (ETEC) NOT DETECTED NOT DETECTED Final   Shiga like toxin producing E coli (STEC) NOT DETECTED NOT DETECTED Final   Shigella/Enteroinvasive E coli (EIEC) NOT DETECTED NOT DETECTED Final   Cryptosporidium NOT DETECTED NOT DETECTED Final   Cyclospora cayetanensis NOT DETECTED NOT DETECTED Final   Entamoeba histolytica NOT DETECTED NOT DETECTED Final   Giardia lamblia NOT DETECTED NOT DETECTED Final   Adenovirus F40/41 NOT DETECTED NOT DETECTED Final   Astrovirus NOT DETECTED NOT DETECTED Final   Norovirus GI/GII NOT DETECTED NOT DETECTED Final   Rotavirus A NOT DETECTED NOT DETECTED Final   Sapovirus (I, II, IV, and V) NOT DETECTED NOT DETECTED Final    Comment: Performed at [facility], [address] [place], [place] [address]  Culture, Urine     Status: None   Collection Time: 01/09/20  3:45 PM   Specimen: Urine, Catheterized  Result Value Ref Range Status   Specimen Description URINE, CATHETERIZED  Final   Special Requests NONE  Final   Culture    Final    NO GROWTH Performed at [facility], [address] [place], [place] [address]    Report Status 01/10/2020 FINAL  Final    Coagulation Studies: No results for input(s): LABPROT, INR in the last 72 hours.  Urinalysis: No results for input(s): COLORURINE, LABSPEC, [place], [place], [place], [place], [place], [place], [place],  NITRITE, LEUKOCYTESUR in the last 72 hours.  Invalid input(s): APPERANCEUR    Imaging: No results found.   Medications:   . sodium chloride    . furosemide (LASIX) 200 mg in dextrose 5% 100 mL (2mg /mL) infusion 10 mg/hr (01/13/20 2142)  . iron sucrose 100 mg (01/15/20 1143)   . atenolol  25 mg Oral Daily  . Chlorhexidine Gluconate Cloth  6 each Topical Daily  . diltiazem  180 mg Oral Daily  . enoxaparin (LOVENOX) injection  60 mg Subcutaneous Daily  . feeding supplement  237 mL Oral TID BM  . gabapentin  300 mg Oral BID  . insulin aspart  0-5 Units Subcutaneous QHS  . insulin aspart  0-9 Units Subcutaneous TID WC  . insulin glargine  20 Units Subcutaneous Daily  . multivitamin with minerals  1 tablet Oral Daily  . oxyCODONE-acetaminophen  1 tablet Oral Once  . pantoprazole  40 mg Oral Daily  . phenazopyridine  100 mg Oral TID WC  . polyethylene glycol  17 g Oral BID  . potassium chloride  40 mEq Oral Q4H  . senna-docusate  1 tablet Oral BID  . sodium chloride flush  3 mL Intravenous Q12H  . sodium chloride flush  3 mL Intravenous Q12H  . sorbitol, milk of mag, mineral oil, glycerin (SMOG) enema  960 mL Rectal Once  . tamsulosin  0.4 mg Oral Daily   sodium chloride, acetaminophen **OR** acetaminophen, alum & mag hydroxide-simeth, bisacodyl, hydrALAZINE, LORazepam, nitroGLYCERIN, ondansetron **OR** ondansetron (ZOFRAN) IV, oxyCODONE-acetaminophen, sodium chloride flush  Assessment/ Plan:  1.Nephrotic syndrome likely due to diabetic nephropathy: He has severe hypoalbuminemia associated with edema and urine PCR 3.5 g.  Urinalysis has glucosuria and proteinuria without hematuria.  Hep B, hep C, HIV, ANA and kappa lambda ratio unremarkable. Kidney ultrasound with normal cortical thickness and without obstruction. The bladder scan showed urinary retention and patient declined insertion of Foley catheter.  He is still refusing Foley catheter. I will continue to hold ACE inhibitor or ARB while diuresing however this needs to be started after maintaining euvolemia.  Continues to have good urine output.  Will transition to oral Lasix 80 mg twice daily  2.Acute kidney injury due to hypertensive emergency and hemodynamic change related with ACE inhibitor. 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 Management as above. Patient appears to be having good response to IV diuretics. Need to rule out obstruction as discussed above. Avoid nephrotoxins including NSAIDs or IV contrast.  Probably has a degree of acute tubular necrosis.  Creatinine appears to be improving.  Baseline creatinine appears to be within normal range.  Will discontinue IV Lasix at this point.  Would recommend oral Lasix 80 mg twice daily.  3.Bilateral lower extremity edema: diuretics as above.  4.Hypokalemia due to diuretics:  Will replete  5.Hypertensive improved blood pressure control  6.anemia T sats 13%.  IV iron administered  Will sign off on patient today thank you for most interesting consult.   LOS: [place] @TODAY @10 :15 AM

## 2020-01-16 NOTE — Progress Notes (Signed)
Pt refused all the morning medicines except Oxycodone for gastric pain and Protonix. Pt pulled the IV lasix and IV potassium line out and adamant to not to have it.

## 2020-01-16 NOTE — Consult Note (Addendum)
CC: Abdominal distention   Requesting provider: Aline August, MD  HPI: [name] is an 59 y.o. male who has been admitted to the internal medicine service for the past 16 days with nephrotic syndrome requiring aggressive diuresis (Lasix drip, >19L net negative this admission) which has been complicated by prolonged and severe hypokalemia (<3.1 for the past 5 days). The patient has had abdominal distention for the past 12 days. He underwent a CT scan 11 days prior which did not demonstrate a mechanical obstruction, but did show pan-colonic dilation. The patient has continued to have loose stools over this period (has had BM each of last 3 days). Over the last 2 days his abdominal distention has increased along with his abdominal pain. Abdominal XR now show large and small bowel dilation. He does not have an NGT and has been allowed a diet. General surgery has been consulted for evaluation and treatment recommendations.   Past Medical History:  Diagnosis Date  . Arthritis   . Bipolar affective ([name])   . CHF (congestive heart failure) ([place])   . Depression   . Diabetes mellitus    uncontrolled   . Hypertension     Past Surgical History:  Procedure Laterality Date  . KNEE SURGERY     b/l knees; pending total knee     Family History  Problem Relation Age of Onset  . Diabetes Other   . Cancer Mother        uterus; deceased   . Heart attack Mother 75  . Hypertension Other   . Heart attack Father 43  . Stroke Sister        12/2012 age 40 y.o    Social:  reports that he has quit smoking. He has never used smokeless tobacco. He reports current drug use. Drug: Cocaine. He reports that he does not drink alcohol.  Allergies:  Allergies  Allergen Reactions  . Other Anaphylaxis, Itching and Swelling    Allergy to GRITS , unknown exactly the specific ingredient he is allergic too    Medications: I have reviewed the patient's current medications.   ROS - ROS limited due to  distracting pain and patient condition  PE Blood pressure (!) 183/97, pulse 74, temperature (!) 97.4 F (36.3 C), temperature source Oral, resp. rate 18, height 5\' 8"  (1.727 m), weight 110 kg, SpO2 98 %. Constitutional: moderate distress; conversant; no deformities Eyes: Moist conjunctiva; no lid lag; anicteric; PERRL Neck: Trachea midline; no thyromegaly Lungs: Normal respiratory effort; no tactile fremitus CV: RRR; no palpable thrills; no pitting edema GI: Abd markedly distended, diffusely tender to palpation; no palpable hepatosplenomegaly MSK: Unable to assess gait due to patient condition; no clubbing/cyanosis Psychiatric: Distracted; alert and oriented x3 Lymphatic: No palpable cervical or axillary lymphadenopathy Skin: No rashes or lesions appreciated   Results for orders placed or performed during the hospital encounter of 12/31/19 (from the past 48 hour(s))  Glucose, capillary     Status: Abnormal   Collection Time: 01/14/20  4:14 PM  Result Value Ref Range   Glucose-Capillary 140 (H) 70 - 99 mg/dL    Comment: Glucose reference range applies only to samples taken after fasting for at least 8 hours.  Glucose, capillary     Status: Abnormal   Collection Time: 01/14/20  9:11 PM  Result Value Ref Range   Glucose-Capillary 186 (H) 70 - 99 mg/dL    Comment: Glucose reference range applies only to samples taken after fasting for at least 8 hours.  Renal function panel     Status: Abnormal   Collection Time: 01/15/20  2:15 AM  Result Value Ref Range   Sodium 138 135 - 145 mmol/L   Potassium 2.7 (LL) 3.5 - 5.1 mmol/L    Comment: CRITICAL RESULT CALLED TO, READ BACK BY AND VERIFIED WITH: KOME J,RN 01/15/20 0321 WAYK    Chloride 102 98 - 111 mmol/L   CO2 25 22 - 32 mmol/L   Glucose, Bld 135 (H) 70 - 99 mg/dL    Comment: Glucose reference range applies only to samples taken after fasting for at least 8 hours.   BUN 39 (H) 6 - 20 mg/dL   Creatinine, Ser 1.43 (H) 0.61 - 1.24 mg/dL     Calcium 8.9 8.9 - 10.3 mg/dL   Phosphorus 3.9 2.5 - 4.6 mg/dL   Albumin 2.2 (L) 3.5 - 5.0 g/dL   GFR, Estimated 57 (L) >60 mL/min    Comment: (NOTE) Calculated using the CKD-EPI Creatinine Equation (2021)    Anion gap 11 5 - 15    Comment: Performed at [facility] [address] [place], [place] [address]  Glucose, capillary     Status: Abnormal   Collection Time: 01/15/20  7:36 AM  Result Value Ref Range   Glucose-Capillary 120 (H) 70 - 99 mg/dL    Comment: Glucose reference range applies only to samples taken after fasting for at least 8 hours.  Glucose, capillary     Status: Abnormal   Collection Time: 01/15/20 11:10 AM  Result Value Ref Range   Glucose-Capillary 150 (H) 70 - 99 mg/dL    Comment: Glucose reference range applies only to samples taken after fasting for at least 8 hours.  Glucose, capillary     Status: Abnormal   Collection Time: 01/15/20  4:41 PM  Result Value Ref Range   Glucose-Capillary 156 (H) 70 - 99 mg/dL    Comment: Glucose reference range applies only to samples taken after fasting for at least 8 hours.  Glucose, capillary     Status: Abnormal   Collection Time: 01/15/20  9:37 PM  Result Value Ref Range   Glucose-Capillary 144 (H) 70 - 99 mg/dL    Comment: Glucose reference range applies only to samples taken after fasting for at least 8 hours.  Renal function panel     Status: Abnormal   Collection Time: 01/16/20  2:36 AM  Result Value Ref Range   Sodium 139 135 - 145 mmol/L   Potassium 2.4 (LL) 3.5 - 5.1 mmol/L    Comment: CRITICAL RESULT CALLED TO, READ BACK BY AND VERIFIED WITH: KOME J,RN 01/16/20 0357 WAYK    Chloride 103 98 - 111 mmol/L   CO2 28 22 - 32 mmol/L   Glucose, Bld 126 (H) 70 - 99 mg/dL    Comment: Glucose reference range applies only to samples taken after fasting for at least 8 hours.   BUN 29 (H) 6 - 20 mg/dL   Creatinine, Ser 1.26 (H) 0.61 - 1.24 mg/dL   Calcium 9.0 8.9 - 10.3 mg/dL   Phosphorus 3.5 2.5 - 4.6 mg/dL    Albumin 2.1 (L) 3.5 - 5.0 g/dL   GFR, Estimated >60 >60 mL/min    Comment: (NOTE) Calculated using the CKD-EPI Creatinine Equation (2021)    Anion gap 8 5 - 15    Comment: Performed at [facility] [address] [place],  [address]  CBC with Differential/Platelet     Status: Abnormal  Collection Time: 01/16/20  2:36 AM  Result Value Ref Range   WBC 4.6 4.0 - 10.5 K/uL   RBC 3.80 (L) 4.22 - 5.81 MIL/uL   Hemoglobin 9.8 (L) 13.0 - 17.0 g/dL   HCT 31.0 (L) 39 - 52 %   MCV 81.6 80.0 - 100.0 fL   MCH 25.8 (L) 26.0 - 34.0 pg   MCHC 31.6 30.0 - 36.0 g/dL   RDW 15.3 11.5 - 15.5 %   Platelets 303 150 - 400 K/uL   nRBC 0.0 0.0 - 0.2 %   Neutrophils Relative % 54 %   Neutro Abs 2.5 1.7 - 7.7 K/uL   Lymphocytes Relative 25 %   Lymphs Abs 1.2 0.7 - 4.0 K/uL   Monocytes Relative 16 %   Monocytes Absolute 0.7 0.1 - 1.0 K/uL   Eosinophils Relative 5 %   Eosinophils Absolute 0.2 0.0 - 0.5 K/uL   Basophils Relative 0 %   Basophils Absolute 0.0 0.0 - 0.1 K/uL   Immature Granulocytes 0 %   Abs Immature Granulocytes 0.01 0.00 - 0.07 K/uL    Comment: Performed at [facility], [address], [place], [place] [address]  Glucose, capillary     Status: Abnormal   Collection Time: 01/16/20  7:47 AM  Result Value Ref Range   Glucose-Capillary 136 (H) 70 - 99 mg/dL    Comment: Glucose reference range applies only to samples taken after fasting for at least 8 hours.  Glucose, capillary     Status: Abnormal   Collection Time: 01/16/20 11:53 AM  Result Value Ref Range   Glucose-Capillary 168 (H) 70 - 99 mg/dL    Comment: Glucose reference range applies only to samples taken after fasting for at least 8 hours.    DG Abd 2 Views  Result Date: 01/16/2020 CLINICAL DATA:  Worsening abdominal pain and distention. EXAM: ABDOMEN - 2 VIEW COMPARISON:  Multiple study since January 05, 2020 FINDINGS: Diffuse colonic dilatation is identified, similar in the interval. There are now  dilated loops of small bowel, particularly in the left side of the abdomen. The small bowel dilatation is apparently new. No free air, portal venous gas, or pneumatosis identified. IMPRESSION: 1. Diffuse colonic dilatation remains. There is now small bowel dilatation as well, which is a new finding, particular in the left side of the abdomen. The findings could represent ileus or distal colonic obstruction. Electronically Signed   By: Dorise Bullion III M.D   On: 01/16/2020 13:56    Imaging: CT AP w/contrast pending  A/P: JAMEAR CARBONNEAU is an 59 y.o. male with an ileus related to prolonged and significant hypokalemia with small and large bowel dilation. Unlikely this is related to a mechanical obstruction as CT 11 days prior did not identify a transition point, but not unreasonable to re-scan.   - NGT placement for decompression - Aggressive potassium supplementation - NPO - Minimize narcotics - Ensure Magnesium is supplemented appropriately  - General surgery will continue to follow   Sheria Lang, MD General Surgery

## 2020-01-16 NOTE — Progress Notes (Signed)
Patient report to RN that he was not tolerating PRN Morphine for pain. RN notified on-call T. Opyd for alternative PRN pain medication. Order for fentaNL q2h was placed. RN pulled up 41mL out of pyxis. RN informed patient of new order for  pain medication. Patient verbalized refusal of receiving fentanyl and stated "I'm scared of it. I heard a lot of bad things about that med". Patient then stated that he will take tylenol instead for his pain of 10/10 in abdomen. RN wasted 28mL of fentanyl in stericyle with RN Amy as witness.

## 2020-01-16 NOTE — Progress Notes (Signed)
CRITICAL VALUE ALERT  Critical Value:  potassium 2.4  Date & Time Notied:  01/16/20, 2300  Provider Notified: T. Opyd  Orders Received/Actions taken: see new orders

## 2020-01-16 NOTE — Progress Notes (Signed)
Patient ID: [name], male   DOB: [birth year], 59 y.o.   MRN: [id number]  PROGRESS NOTE    [name]  NID:[id number] DOB: [birth year] DOA: 12/31/2019 PCP: [name], NP   Brief Narrative:  59 y.o. malewith medical history significant forhypertension, poorly controlled diabetes mellitus, chronic leukopenia, history of CVA, and history of substance abuse presented secondary to body aches with evidence of mild rhabdomyolysis and volume overload, found to have nephrotic syndrome and AKI. Started on IV diuresis.  Nephrology following.  Assessment & Plan:   Nephrotic syndrome -Urinalysis significant for large protein/glucose with a urine protein/creatinine of 3.51. Serum albumin of around 2. Albumin IV given.  -Nephrology following and patient is still on Lasix drip -Daily weights/ strict input and output.  Negative balance of 19,573.2 cc since admission.  Abdominal distension -Significant. Associated abdominal pain. Patient states he is not passing gas. Multiple episodes of watery stool. Decreased bowel sounds. No obstruction or volvulus on CT abdomen (11/10). Possibly some colitis, but if so, mild. No leukocytosis. CT scan also with urinary distension. GI pathogen panel negative. Repeat x-ray with ileus vs distal obstruction. Improved slightly after foley placement. Suppository on 11/15 with solid stool output -Mineral oil enema if patient will allow -X-ray on 01/13/2020 showed persistent diffuse gaseous colonic distention.  Added scheduled Senokot and MiraLAX with as needed Dulcolax.  AKI on CKD stage III AKI secondary to hypertensive emergency/ACE inhibitor use. Baseline creatinine of about 1.1. Peak creatinine of 1.42. benazepril held. Back towards baseline initially with acute worsening.  No hydronephrosis on renal ultrasound. Possibly some associated ATN. -Creatinine 1.26 today  Hypokalemia -Replace.  Repeat a.m. labs.  Patient is refusing IV potassium this morning   Chronic diastolic heart dysfunction -Stable.  Primary hypertension Hypertensive emergency on admission. Blood pressure better controlled now but still elevated at times. -Continue atenolol and diltiazem  Anemia Normocytic. Anemia panel suggests possible mixed picture. Stable. No evidence of bleed. In setting of diarrhea as well as chronic kidney disease.   Diabetes mellitus, type 2 Uncontrolled with hyperglycemia and hypoglycemia Patient is on Lantus 40 units qhs and Januemet as an outpatient. -Continue Lantus 20 units daily and continue SSI  Bicuspid aortic valve Moderate aortic valve stenosis Associated LVH without evidence of LV systolic dysfunction. No LV dilation. -Cardiology follow-up outpatient  Urinary retention Bladder scan on 11/11 was significant for 65mL+ of retained urine. Continued urinary retention and patient continued to refuse foley placement until 11/14. Foley placed successfully on 11/14 -will need outpatient urology evaluation  Chest pain Likely reflux related but patient does have evidence of cholelithiasis with stone located in the neck of the gallbladder. Chest pain resolved -Continue Protonix   DVT prophylaxis: Lovenox Code Status:   Code Status: Full Code Family Communication: None at bedside Disposition Plan: Status is: Inpatient  Remains inpatient appropriate because:Inpatient level of care appropriate due to severity of illness.  Discharge to SNF once cleared by nephrology   Dispo: The patient is from: Home              Anticipated d/c is to: SNF              Anticipated d/c date is: 2 days              Patient currently is not medically stable to d/c.   Consultants: Nephrology  Procedures: Echo showed EF of 65 to 70% with grade 1 diastolic dysfunction  Antimicrobials: None   Subjective: Patient seen and examined  at bedside.  Poor historian.  Wakes up slightly, hardly participates in any conversation.  Nursing staff reports  that patient refused IV potassium this morning.  No overnight fever or vomiting reported.   Objective: Vitals:   01/14/20 2041 01/15/20 0502 01/15/20 1910 01/16/20 0636  BP: 140/73 (!) 155/81 (!) 155/91 (!) 164/90  Pulse: 74 71 73 72  Resp: 20 18 20 18   Temp: 98.2 F (36.8 C) 97.7 F (36.5 C) 97.9 F (36.6 C) 98.2 F (36.8 C)  TempSrc: Oral Oral Oral Oral  SpO2: 100%  100% 99%  Weight:  124.7 kg  110 kg  Height:        Intake/Output Summary (Last 24 hours) at 01/16/2020 0747 Last data filed at 01/16/2020 2725 Gross per 24 hour  Intake -  Output 5100 ml  Net -5100 ml   Filed Weights   01/14/20 0426 01/15/20 0502 01/16/20 0636  Weight: 126.3 kg 124.7 kg 110 kg    Examination:  General exam: No acute distress.  Very poor historian.  Currently on room air. Respiratory system: Decreased breath sounds at bases bilaterally with basilar crackles cardiovascular system: S1-S2 heard, rate controlled gastrointestinal system: Abdomen is morbidly obese, still distended, soft and nontender.  Bowel sounds are heard  extremities: No clubbing.  Bilateral lower extremity mild edema present  Central nervous system: Poor historian.  Wakes up slightly, hardly participates in any conversation. No focal neurological deficits.  Moving extremities Skin: No obvious petechiae/rashes psychiatry: Flat affect  Data Reviewed: I have personally reviewed following labs and imaging studies  CBC: Recent Labs  Lab 01/11/20 0239 01/13/20 0706 01/14/20 0507 01/16/20 0236  WBC 3.5* 3.5* 4.4 4.6  NEUTROABS  --  1.9 2.3 2.5  HGB 9.1* 10.2* 9.9* 9.8*  HCT 29.0* 33.4* 31.7* 31.0*  MCV 81.0 81.1 80.7 81.6  PLT 285 310 280 366   Basic Metabolic Panel: Recent Labs  Lab 01/09/20 1027 01/10/20 0409 01/11/20 0239 01/13/20 0706 01/14/20 0507 01/15/20 0215 01/16/20 0236  NA 134*   < > 135 136 137 138 139  K 4.6   < > 4.0 3.1* 3.0* 2.7* 2.4*  CL 100   < > 103 100 104 102 103  CO2 22   < > 23 25 24  25 28   GLUCOSE 100*   < > 114* 180* 130* 135* 126*  BUN 30*   < > 37* 41* 42* 39* 29*  CREATININE 2.18*   < > 2.11* 1.88* 1.73* 1.43* 1.26*  CALCIUM 9.0   < > 8.9 9.0 9.0 8.9 9.0  MG 2.0  --   --   --  2.1  --   --   PHOS  --   --   --   --  5.0* 3.9 3.5   < > = values in this interval not displayed.   GFR: Estimated Creatinine Clearance: 76.8 mL/min (A) (by C-G formula based on SCr of 1.26 mg/dL (H)). Liver Function Tests: Recent Labs  Lab 01/09/20 1027 01/14/20 0507 01/15/20 0215 01/16/20 0236  AST 49*  --   --   --   ALT 35  --   --   --   ALKPHOS 94  --   --   --   BILITOT 0.6  --   --   --   PROT 6.9  --   --   --   ALBUMIN 2.9* 2.5* 2.2* 2.1*   No results for input(s): LIPASE, AMYLASE in the last 168 hours.  Recent Labs  Lab 01/11/20 1053  AMMONIA 35   Coagulation Profile: No results for input(s): INR, PROTIME in the last 168 hours. Cardiac Enzymes: No results for input(s): CKTOTAL, CKMB, CKMBINDEX, TROPONINI in the last 168 hours. BNP (last 3 results) No results for input(s): PROBNP in the last 8760 hours. HbA1C: No results for input(s): HGBA1C in the last 72 hours. CBG: Recent Labs  Lab 01/14/20 2111 01/15/20 0736 01/15/20 1110 01/15/20 1641 01/15/20 2137  GLUCAP 186* 120* 150* 156* 144*   Lipid Profile: No results for input(s): CHOL, HDL, LDLCALC, TRIG, CHOLHDL, LDLDIRECT in the last 72 hours. Thyroid Function Tests: No results for input(s): TSH, T4TOTAL, FREET4, T3FREE, THYROIDAB in the last 72 hours. Anemia Panel: No results for input(s): VITAMINB12, FOLATE, FERRITIN, TIBC, IRON, RETICCTPCT in the last 72 hours. Sepsis Labs: No results for input(s): PROCALCITON, LATICACIDVEN in the last 168 hours.  Recent Results (from the past 240 hour(s))  Gastrointestinal Panel by PCR , Stool     Status: None   Collection Time: 01/06/20 12:26 PM   Specimen: Stool  Result Value Ref Range Status   Campylobacter species NOT DETECTED NOT DETECTED Final    Plesimonas shigelloides NOT DETECTED NOT DETECTED Final   Salmonella species NOT DETECTED NOT DETECTED Final   Yersinia enterocolitica NOT DETECTED NOT DETECTED Final   Vibrio species NOT DETECTED NOT DETECTED Final   Vibrio cholerae NOT DETECTED NOT DETECTED Final   Enteroaggregative E coli (EAEC) NOT DETECTED NOT DETECTED Final   Enteropathogenic E coli (EPEC) NOT DETECTED NOT DETECTED Final   Enterotoxigenic E coli (ETEC) NOT DETECTED NOT DETECTED Final   Shiga like toxin producing E coli (STEC) NOT DETECTED NOT DETECTED Final   Shigella/Enteroinvasive E coli (EIEC) NOT DETECTED NOT DETECTED Final   Cryptosporidium NOT DETECTED NOT DETECTED Final   Cyclospora cayetanensis NOT DETECTED NOT DETECTED Final   Entamoeba histolytica NOT DETECTED NOT DETECTED Final   Giardia lamblia NOT DETECTED NOT DETECTED Final   Adenovirus F40/41 NOT DETECTED NOT DETECTED Final   Astrovirus NOT DETECTED NOT DETECTED Final   Norovirus GI/GII NOT DETECTED NOT DETECTED Final   Rotavirus A NOT DETECTED NOT DETECTED Final   Sapovirus (I, II, IV, and V) NOT DETECTED NOT DETECTED Final    Comment: Performed at [facility], [address] [place], [place] [address]  Culture, Urine     Status: None   Collection Time: 01/09/20  3:45 PM   Specimen: Urine, Catheterized  Result Value Ref Range Status   Specimen Description URINE, CATHETERIZED  Final   Special Requests NONE  Final   Culture   Final    NO GROWTH Performed at [facility], [address] [place], [place] [address]    Report Status 01/10/2020 FINAL  Final         Radiology Studies: No results found.      Scheduled Meds: . atenolol  25 mg Oral Daily  . Chlorhexidine Gluconate Cloth  6 each Topical Daily  . diltiazem  180 mg Oral Daily  . enoxaparin (LOVENOX) injection  60 mg Subcutaneous Daily  . feeding supplement  237 mL Oral TID BM  . gabapentin  300 mg Oral BID  . insulin aspart  0-5 Units Subcutaneous QHS  .  insulin aspart  0-9 Units Subcutaneous TID WC  . insulin glargine  20 Units Subcutaneous Daily  . multivitamin with minerals  1 tablet Oral Daily  . oxyCODONE-acetaminophen  1 tablet Oral Once  .  pantoprazole  40 mg Oral Daily  . phenazopyridine  100 mg Oral TID WC  . polyethylene glycol  17 g Oral BID  . potassium chloride  40 mEq Oral Q6H  . senna-docusate  1 tablet Oral BID  . sodium chloride flush  3 mL Intravenous Q12H  . sodium chloride flush  3 mL Intravenous Q12H  . sorbitol, milk of mag, mineral oil, glycerin (SMOG) enema  960 mL Rectal Once  . tamsulosin  0.4 mg Oral Daily   Continuous Infusions: . sodium chloride    . furosemide (LASIX) 200 mg in dextrose 5% 100 mL (2mg /mL) infusion 10 mg/hr (01/13/20 2142)  . iron sucrose 100 mg (01/15/20 1143)          Aline August, MD [facility] 01/16/2020, 7:47 AM

## 2020-01-16 NOTE — Progress Notes (Signed)
I let Dr. Starla Link know that abd xray results were back and patient still in remarkable amount of pain. Also, patients K is low and he has been unable to take PO Kdur ordered earlier. Orders received for CT scan, IV Potassium and morphine. Surgery consulted on patient and coming to assess

## 2020-01-16 NOTE — Progress Notes (Signed)
CRITICAL VALUE ALERT  Critical Value: 2.4  Date & Time Notied: 01/16/2020 @ 3837  Provider Notified: Dr. Myna Hidalgo  Orders Received/Actions taken: PO and IV potassium

## 2020-01-16 NOTE — Progress Notes (Signed)
I was assisting primary nurse for patient and was attempting to give meds when I walked in and found patients abdomen remarkably distended and patient screaming for his stomach bing in so much pain. I immediately secure chatted Dr. Starla Link and orders received for an abdominal xray. Myself and [place], RN transported patient to xray. Patient downstairs in xray said his left groin is in more pain than his stomach and his stomach was terrible but left groin was excruciating. Dr. Starla Link notified and orders received.

## 2020-01-16 NOTE — Progress Notes (Signed)
Patient refusing ordered potassium stating "its nasty". His IV was leaking so IV team came to restart his IV so he can get the IV potassium, but he refused. Only received 50 ml of the IV potassium.The writer explained to him the importance of taking his potassium. He yelled stating "I'm the patient, I have the right to refuse the potassium and a new IV". He requested for another nurse.  He continued to yell and call the front desk. On call Dr. Myna Hidalgo made aware.

## 2020-01-17 DIAGNOSIS — I5189 Other ill-defined heart diseases: Secondary | ICD-10-CM | POA: Diagnosis not present

## 2020-01-17 DIAGNOSIS — E876 Hypokalemia: Secondary | ICD-10-CM | POA: Diagnosis not present

## 2020-01-17 DIAGNOSIS — Z515 Encounter for palliative care: Secondary | ICD-10-CM | POA: Diagnosis not present

## 2020-01-17 DIAGNOSIS — N049 Nephrotic syndrome with unspecified morphologic changes: Secondary | ICD-10-CM | POA: Diagnosis not present

## 2020-01-17 DIAGNOSIS — R451 Restlessness and agitation: Secondary | ICD-10-CM

## 2020-01-17 DIAGNOSIS — K567 Ileus, unspecified: Secondary | ICD-10-CM

## 2020-01-17 DIAGNOSIS — R52 Pain, unspecified: Secondary | ICD-10-CM | POA: Diagnosis not present

## 2020-01-17 DIAGNOSIS — Z7189 Other specified counseling: Secondary | ICD-10-CM | POA: Diagnosis not present

## 2020-01-17 DIAGNOSIS — I10 Essential (primary) hypertension: Secondary | ICD-10-CM | POA: Diagnosis not present

## 2020-01-17 LAB — URINE CULTURE: Culture: NO GROWTH

## 2020-01-17 LAB — BASIC METABOLIC PANEL
Anion gap: 11 (ref 5–15)
BUN: 27 mg/dL — ABNORMAL HIGH (ref 6–20)
CO2: 25 mmol/L (ref 22–32)
Calcium: 8.6 mg/dL — ABNORMAL LOW (ref 8.9–10.3)
Chloride: 104 mmol/L (ref 98–111)
Creatinine, Ser: 1.44 mg/dL — ABNORMAL HIGH (ref 0.61–1.24)
GFR, Estimated: 56 mL/min — ABNORMAL LOW (ref >60)
Glucose, Bld: 166 mg/dL — ABNORMAL HIGH (ref 70–99)
Potassium: 2.2 mmol/L — CL (ref 3.5–5.1)
Sodium: 140 mmol/L (ref 135–145)

## 2020-01-17 LAB — RENAL FUNCTION PANEL
Albumin: 2.1 g/dL — ABNORMAL LOW (ref 3.5–5.0)
Anion gap: 18 — ABNORMAL HIGH (ref 5–15)
BUN: 28 mg/dL — ABNORMAL HIGH (ref 6–20)
CO2: 23 mmol/L (ref 22–32)
Calcium: 8.8 mg/dL — ABNORMAL LOW (ref 8.9–10.3)
Chloride: 99 mmol/L (ref 98–111)
Creatinine, Ser: 1.27 mg/dL — ABNORMAL HIGH (ref 0.61–1.24)
GFR, Estimated: >60 mL/min (ref >60)
Glucose, Bld: 186 mg/dL — ABNORMAL HIGH (ref 70–99)
Phosphorus: 4.8 mg/dL — ABNORMAL HIGH (ref 2.5–4.6)
Potassium: 2.4 mmol/L — CL (ref 3.5–5.1)
Sodium: 140 mmol/L (ref 135–145)

## 2020-01-17 LAB — GLUCOSE, CAPILLARY
Glucose-Capillary: 175 mg/dL — ABNORMAL HIGH (ref 70–99)
Glucose-Capillary: 192 mg/dL — ABNORMAL HIGH (ref 70–99)
Glucose-Capillary: 148 mg/dL — ABNORMAL HIGH (ref 70–99)
Glucose-Capillary: 139 mg/dL — ABNORMAL HIGH (ref 70–99)

## 2020-01-17 MED ORDER — POTASSIUM CHLORIDE 10 MEQ/100ML IV SOLN
10.0000 meq | INTRAVENOUS | Status: AC
  Administered 2020-01-17 (×4): 10 meq via INTRAVENOUS
  Filled 2020-01-17 (×4): qty 100

## 2020-01-17 MED ORDER — POTASSIUM CHLORIDE IN NACL 40-0.9 MEQ/L-% IV SOLN
INTRAVENOUS | Status: DC
  Filled 2020-01-17 (×6): qty 1000

## 2020-01-17 MED ORDER — PANTOPRAZOLE SODIUM 40 MG IV SOLR
40.0000 mg | INTRAVENOUS | Status: DC
  Administered 2020-01-17 – 2020-01-22 (×6): 40 mg via INTRAVENOUS
  Filled 2020-01-17 (×6): qty 40

## 2020-01-17 MED ORDER — POTASSIUM CHLORIDE 10 MEQ/100ML IV SOLN
10.0000 meq | INTRAVENOUS | Status: AC
  Administered 2020-01-17 (×6): 10 meq via INTRAVENOUS
  Filled 2020-01-17 (×5): qty 100

## 2020-01-17 MED ORDER — HALOPERIDOL LACTATE 5 MG/ML IJ SOLN
2.0000 mg | Freq: Four times a day (QID) | INTRAMUSCULAR | Status: DC | PRN
  Administered 2020-01-21: 5 mg via INTRAVENOUS
  Filled 2020-01-17: qty 1

## 2020-01-17 MED ORDER — INSULIN GLARGINE 100 UNIT/ML ~~LOC~~ SOLN
10.0000 [IU] | Freq: Every day | SUBCUTANEOUS | Status: DC
  Administered 2020-01-17 – 2020-01-24 (×8): 10 [IU] via SUBCUTANEOUS
  Filled 2020-01-17 (×8): qty 0.1

## 2020-01-17 NOTE — Progress Notes (Addendum)
CRITICAL VALUE ALERT  Critical Value:  K 2.2  Date & Time Notied:  12/28/19 1526  Provider Notified: Starla Link, MD  Orders Received/Actions taken: IVPB Potassium

## 2020-01-17 NOTE — Consult Note (Signed)
Mental Capacity Assessment: I have evaluated the following areas to assess  [name]'s mental capacity regarding medical decision-making ability which pertains to competency to accept or refuse medical treatment.   The specific treatment or service in question is: refusing IVs and NG tube placement.   Communication: The patient was able to clearly state preferred treatment options for his medical care at this time. The patient was able to decide that he does want IV access and to proceed with treatment. He currently has IV fluids going at the time of the evaluation. Factors that could compromise this communication process include: nephrotoxicity, hyperuremia, chronic pain, hyponatremia, hypothermia, and sepsis. Regarding his treatment he would like to treat what he can treat at this time.   Understanding: The patient was able to recall information, link causal relationships, and process general probabilities regarding life situations and medical treatment scenarios.He was able to paraphrase his view of the current situation and his thoughts about it including goals of care with palliative medicine. The patient did present with impairments in memory, attention span, judgement, and intelligence.   Appreciation: The patient was able to identify and describe his  various illnesses and treatment options with potential outcomes. The patient did not present with concerns such as denial or delusional thought-process. " I am confused as to why I am talking to you since I have talked to the other lady already. "    Rationalization: The patient was able toa certain extent to weigh risks and benefits and come to a conclusion congruent with patient's perceived goals. Concerns regarding this category are: depression, acute/chronic encephalopathy, electrolyte imbalances,  In conclusion, the patient is experiencing an acute medical scenario: Recent refusal of IV lines and NG placement, which could compromise his  mental capacity, especially with his nephrotic syndrome and AKI.    Conclusion: At this time, there is insufficient evidence to warrant removal of the patient's rights for medical decision-making. 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  At this time, we can determine that the patient does have functional mental capacity for medical decision-making including the right to accept or refuse treatment.  There was also a consult with palliative medicine that took place today, patient recently completed  His Goals of care evaluation today. He was blunt at times as a result. Please refer to PM consult for additional information.  At times he remains oppositional about psych services at this time, but please adhere to POC goals as discussed by PM.

## 2020-01-17 NOTE — Consult Note (Addendum)
Palliative Medicine Inpatient Consult Note  Reason for consult:  Goals of Care  HPI:  Per intake H&P --> 59 y.o.malewith medical history significant forhypertension, poorly controlled diabetes mellitus, chronic leukopenia, history of CVA, and history of substance abuse presented secondary to body aches with evidence of mild rhabdomyolysis and volume overload, found to have nephrotic syndrome and AKI. Started on IV diuresis.  Nephrology was consulted.  Palliative care was asked to get involved in the setting of noncompliance to offered medical interventions.  Clinical Assessment/Goals of Care: I have reviewed medical records including EPIC notes, labs and imaging, received report from bedside RN, assessed the patient who is lying in bed in no distress.    I met with [name] to further discuss diagnosis prognosis, GOC, EOL wishes, disposition and options.   I introduced Palliative Medicine as specialized medical care for people living with serious illness. It focuses on providing relief from the symptoms and stress of a serious illness. The goal is to improve quality of life for both the patient and the family.  [name] states to me that he lives in [place].He is presently living with his brother, [name] and his father, [name]. 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 He is married - he and his wife, [name] have been married for eighteen years though are separated. He is not overtly religious though has practiced within the [place].   From a functional standpoint [name] is wheelchair dependent. His family helps him perform bADLs.   A detailed discussion was had today regarding advanced directives - patient has never completed these.  I was able to leave a copy at bedside for he and his family to complete. Patient himself shares that if he were incapacitated her would rely on his brother, [name] to make decisions for him.   Concepts specific to code status, artifical feeding  and hydration, continued IV antibiotics and rehospitalization was had. I explained to [name] that a true code in someone like him with so many underlying co-morbidities could be catastrophic.I explained that in such a situation I would worry that we would cause much more harm than true benefit.  He shares with me that he would not want to be in a position where he was reliant upon machines to sustain life.  I completed a MOST form today. The patient and family outlined their wishes for the following treatment decisions:  Cardiopulmonary Resuscitation: Do Not Attempt Resuscitation (DNR/No CPR)  Medical Interventions: Limited Additional Interventions: Use medical treatment, IV fluids and cardiac monitoring as indicated, DO NOT USE intubation or mechanical ventilation. May consider use of less invasive airway support such as BiPAP or CPAP. Also provide comfort measures. Transfer to the hospital if indicated. Avoid intensive care.   Antibiotics: Antibiotics if indicated  IV Fluids: IV fluids if indicated  Feeding Tube: No feeding tube   We again reviewed and verified the MOST form. He vocalized that these are his wishes. I asked him for assurance prior to gaining his signature which we he stated that he was sure.   The difference between a aggressive medical intervention path  and a palliative comfort care path for this patient at this time was had. Values and goals of care important to patient and family were attempted to be elicited. [name] does not seem to remember pulling out lines or his NGT. He shares that he does not know he had done that. We discussed that if he remains to be non-complaint with medical  treatments the end result could be far more painful and possibly even morbid.   Discussed the importance of continued conversation with family and their  medical providers regarding overall plan of care and treatment options, ensuring decisions are within the context of the patients values and  GOCs. ______________________________________________________________________________ Addendum: I spoke to patients brother, [name] and father, [name]. They share with me that [name] had lived in a group home for fourteen years. After he suffered a CVA in September they moved him here to live with them. He relies on them to help with his activities. It has been very challenging as he has fallen a multitude of times requiring EMS to come and help get him up.   [name]. has fairly poor judgement and awareness at times most especially as a relates to his drug abuse.  [name] shares that she patient "doesn't do anything that he doesn't want to". His father states that he is very stubborn and set in his ways. The goal of hospitalization was to ideally stabilize [name] and aid in finding a safer living arrangement per [name]. [name] shares that they are no longer able to provide care for him given his complex medical needs and being total care.  [name]. has a significant h/o of smoking crack cocaine per his brother he believes this is what led to his stroke in September. We further reviewed that much of his disease processes now are associated with poor medical compliance and likely the effect of years of drug abuse.   I shared with [name] and [name] that I had a relatively good conversation with [name] about CODE STATUS.  He had elected to be a DO NOT RESUSCITATE DO NOT INTUBATE.  His father seems to be in agreement with this and says "if [name] says it is what he wants". ________________________________________________________________________________ Addendum [name] by the room this evening to visit again with [name]. He did remember me from earlier. I asked him if he remembered our conversation. He mentioned that he signed "the pink slip" that he didn't want to be hooked up to machines. Confirmed that patient is a DNAR/DNI and place papers on front of chart.  Decision Maker: Patient has previously been  identified to be able to make decisions for himself.  He himself states if he is not able to make decisions on his own behalf he would rely on his brother [name] to make decisions in his honor.  SUMMARY OF RECOMMENDATIONS   DNAR/DNI  MOST Completed, paper copy placed onto the chart electric copy can be found in [facility]  DNR Form Completed, paper copy placed onto the chart electric copy can be found in [facility]  TOC - OP Palliative support  Code Status/Advance Care Planning: DNAR/DNI   Symptom Management:  Muscular Weakness:                 - Physical Therapy Evaluation                 - Occupational Therapy Evaluation   Delirium:                 - Delirium precautions                 - Get up during the day                 - Encourage a familiar face to remain present throughout the day                 -  Keep blinds open and lights on during daylight hours                 - Minimize the use of opioids/benzodiazepines   Falls: - Physical activity - Ensure wearing eyeglasses or contact lenses - Ensure wearing hearing aid - Find out about the side effects of any medicine you take - Ensure adequate sleep - Stand up slowly - Use an assistive devices  - Wear non-skid, rubber-soled, low-heeled shoes, or lace-up shoes with non-skid soles that fully support feet.    Palliative Prophylaxis:   Oral Care, Mobility, Bowels, Fall - Delirium - Aspiration precuations  Additional Recommendations (Limitations, Scope, Preferences):  Treat what is treatable   Psycho-social/Spiritual:   Desire for further Chaplaincy support: No   Additional Recommendations: Education on chronic diease processes and the importance of medical compliance   Prognosis: Poor in the setting of multiple co-morbidities and non-complaince.   Discharge Planning: Discharge plan unclear though patient will likely need long term placement under medicaid.   Vitals:   01/17/20 0557 01/17/20 0817  BP: (!) 188/102  (!) 155/99  Pulse: 85 86  Resp: 20 20  Temp: 98.5 F (36.9 C)   SpO2: 95% 96%    Intake/Output Summary (Last 24 hours) at 01/17/2020 1421 Last data filed at 01/17/2020 1300 Gross per 24 hour  Intake 1528.11 ml  Output 3100 ml  Net -1571.89 ml   Last Weight  Most recent update: 01/17/2020  6:16 AM   Weight  115.1 kg (253 lb 12.8 oz)           Gen:  AA M in NAD HEENT: Dry mucous membranes, very poor oral hygiene CV: Regular rate and rhythm  PULM: clear to auscultation bilaterally  ABD: Distended, high pitch bowel sounds, tender to deep palpation EXT: No edema  Neuro: Alert and oriented x2-3  PPS: 30%   This conversation/these recommendations were discussed with patient primary care team, Dr. Starla Link  Time In: 1300 Time Out: 1430 Total Time: 90 Greater than 50%  of this time was spent counseling and coordinating care related to the above assessment and plan.  [facility] Team Team Cell Phone: [phone] Please utilize secure chat with additional questions, if there is no response within 30 minutes please call the above phone number  Palliative Medicine Team providers are available by phone from 7am to 7pm daily and can be reached through the team cell phone.  Should this patient require assistance outside of these hours, please call the patient's attending physician.

## 2020-01-17 NOTE — Progress Notes (Signed)
Patient ID: [name], male   DOB: 1960/05/06, 59 y.o.   MRN: [id number]  PROGRESS NOTE    [name]  IEP:[id number] DOB: [birth year] DOA: 12/31/2019 PCP: [name], NP   Brief Narrative:  59 y.o. malewith medical history significant forhypertension, poorly controlled diabetes mellitus, chronic leukopenia, history of CVA, and history of substance abuse presented secondary to body aches with evidence of mild rhabdomyolysis and volume overload, found to have nephrotic syndrome and AKI. Started on IV diuresis.  Nephrology was consulted.  Assessment & Plan:   Nephrotic syndrome -Urinalysis significant for large protein/glucose with a urine protein/creatinine of 3.51. Serum albumin of around 2. Albumin IV given.  -Patient was on Lasix drip till 01/16/2020 and subsequently was switched to oral Lasix by nephrology.  Nephrology signed off on 01/16/2020.  Lasix was switched to IV again on the same day because patient was made n.p.o. per general surgery recommendations. -Daily weights/ strict input and output.  Negative balance of 20,800.6 cc since admission.  Ileus -His abdominal distention and pain worsened on 01/16/2020.  X-ray of the abdomen showed persistent colonic dilatation with new small bowel dilatation as well.  CT of the abdomen and pelvis with contrast suggested distal early/partial small bowel obstruction and less likely ileus.  GI and general surgery were consulted.  NG tube was placed yesterday afternoon but patient subsequently has pulled NG tube early this morning. -Follow further general surgery recommendations as patient might need NG tube replacement.  Continue n.p.o.  Continue IV analgesics.  AKI on CKD stage III AKI secondary to hypertensive emergency/ACE inhibitor use. Baseline creatinine of about 1.1. benazepril held. No hydronephrosis on renal ultrasound.  -Creatinine 1.27 today  Hypokalemia -Replace.  Patient pulled out his IV line again last night and  potassium replacement has become increasingly difficult.  Repeat potassium level early this morning was again 2.4.  Continue aggressive potassium replacement and check potassium again later this afternoon. -DC Lasix for now because of persistent hypokalemia.  Intermittent agitation -Patient gets intermittently agitated and has pulled out his IV lines, NG tube, Foley catheter. -Use Haldol as needed.  Consult psychiatry  Chronic diastolic heart dysfunction -Stable.  Primary hypertension Hypertensive emergency on admission. Blood pressure still elevated at times. -Continue atenolol and diltiazem  Anemia Normocytic. Anemia panel suggests possible mixed picture. Stable. No evidence of bleed. In setting of diarrhea as well as chronic kidney disease.   Diabetes mellitus, type 2 Uncontrolled with hyperglycemia and hypoglycemia Patient is on Lantus 40 units qhs and Januemet as an outpatient. -Continue Lantus 20 units daily and continue SSI  Bicuspid aortic valve Moderate aortic valve stenosis Associated LVH without evidence of LV systolic dysfunction. No LV dilation. -Cardiology follow-up outpatient  Urinary retention Bladder scan on 11/11 was significant for 639mL+ of retained urine. Continued urinary retention and patient continued to refuse foley placement until 11/14. Foley placed successfully on 11/14 -will need outpatient urology evaluation  Chest pain Likely reflux related but patient does have evidence of cholelithiasis with stone located in the neck of the gallbladder. Chest pain resolved -Continue Protonix   DVT prophylaxis: Lovenox Code Status:   Code Status: Full Code Family Communication: None at bedside Disposition Plan: Status is: Inpatient  Remains inpatient appropriate because:Inpatient level of care appropriate due to severity of illness.  Discharge to SNF once cleared by nephrology   Dispo: The patient is from: Home              Anticipated d/c is to:  SNF              Anticipated d/c date is: > 3 days              Patient currently is not medically stable to d/c.   Consultants: Nephrology/general surgery/GI.  Procedures: Echo showed EF of 65 to 70% with grade 1 diastolic dysfunction  Antimicrobials: None   Subjective: Patient seen and examined at bedside.  Extremely poor historian.  Sleeping at the edge of the bed wakes up, hardly participates in any conversation.  Nursing staff reported that patient pulled out NG tube last night and pulled out IV line again.  No overnight vomiting reported.  No fevers reported as well.   Objective: Vitals:   01/16/20 0844 01/16/20 1246 01/16/20 1748 01/17/20 0557  BP: (!) 159/94 (!) 183/97 (!) 155/76 (!) 188/102  Pulse: 76 74 84 85  Resp:  18 18 20   Temp:  (!) 97.4 F (36.3 C) 97.9 F (36.6 C) 98.5 F (36.9 C)  TempSrc:  Oral Oral Oral  SpO2:  98% 99% 95%  Weight:    115.1 kg  Height:        Intake/Output Summary (Last 24 hours) at 01/17/2020 0734 Last data filed at 01/17/2020 0558 Gross per 24 hour  Intake 1528.11 ml  Output 2250 ml  Net -721.89 ml   Filed Weights   01/15/20 0502 01/16/20 0636 01/17/20 0557  Weight: 124.7 kg 110 kg 115.1 kg    Examination:  General exam: Extremely poor historian.  No distress.  Currently on room air. Respiratory system: Bilateral decreased breath sounds at bases with scattered crackles, no wheezing  cardiovascular system: Currently rate controlled, S1-S2 heard gastrointestinal system: Abdomen is morbidly obese, still extremely distended, soft and diffusely tender.  Bowel sounds sluggish  extremities: Lower extremity edema present bilaterally.  No cyanosis  Central nervous system: Extremely poor historian.  Does not participate in conversation much.  No focal neurological deficits.  Moves extremities Skin: No obvious ecchymosis/lesions  psychiatry: Affect is very flat  Data Reviewed: I have personally reviewed following labs and imaging  studies  CBC: Recent Labs  Lab 01/11/20 0239 01/13/20 0706 01/14/20 0507 01/16/20 0236  WBC 3.5* 3.5* 4.4 4.6  NEUTROABS  --  1.9 2.3 2.5  HGB 9.1* 10.2* 9.9* 9.8*  HCT 29.0* 33.4* 31.7* 31.0*  MCV 81.0 81.1 80.7 81.6  PLT 285 310 280 128   Basic Metabolic Panel: Recent Labs  Lab 01/14/20 0507 01/15/20 0215 01/16/20 0236 01/16/20 2206 01/17/20 0235  NA 137 138 139 138 140  K 3.0* 2.7* 2.4* 2.4* 2.4*  CL 104 102 103 98 99  CO2 24 25 28 23 23   GLUCOSE 130* 135* 126* 191* 186*  BUN 42* 39* 29* 25* 28*  CREATININE 1.73* 1.43* 1.26* 1.24 1.27*  CALCIUM 9.0 8.9 9.0 9.2 8.8*  MG 2.1  --   --   --   --   PHOS 5.0* 3.9 3.5  --  4.8*   GFR: Estimated Creatinine Clearance: 78.1 mL/min (A) (by C-G formula based on SCr of 1.27 mg/dL (H)). Liver Function Tests: Recent Labs  Lab 01/14/20 0507 01/15/20 0215 01/16/20 0236 01/17/20 0235  ALBUMIN 2.5* 2.2* 2.1* 2.1*   No results for input(s): LIPASE, AMYLASE in the last 168 hours. Recent Labs  Lab 01/11/20 1053  AMMONIA 35   Coagulation Profile: No results for input(s): INR, PROTIME in the last 168 hours. Cardiac Enzymes: No results for input(s): CKTOTAL, CKMB, CKMBINDEX, TROPONINI  in the last 168 hours. BNP (last 3 results) No results for input(s): PROBNP in the last 8760 hours. HbA1C: No results for input(s): HGBA1C in the last 72 hours. CBG: Recent Labs  Lab 01/15/20 2137 01/16/20 0747 01/16/20 1153 01/16/20 1703 01/16/20 2046  GLUCAP 144* 136* 168* 170* 184*   Lipid Profile: No results for input(s): CHOL, HDL, LDLCALC, TRIG, CHOLHDL, LDLDIRECT in the last 72 hours. Thyroid Function Tests: No results for input(s): TSH, T4TOTAL, FREET4, T3FREE, THYROIDAB in the last 72 hours. Anemia Panel: No results for input(s): VITAMINB12, FOLATE, FERRITIN, TIBC, IRON, RETICCTPCT in the last 72 hours. Sepsis Labs: No results for input(s): PROCALCITON, LATICACIDVEN in the last 168 hours.  Recent Results (from the past  240 hour(s))  Culture, Urine     Status: None   Collection Time: 01/09/20  3:45 PM   Specimen: Urine, Catheterized  Result Value Ref Range Status   Specimen Description URINE, CATHETERIZED  Final   Special Requests NONE  Final   Culture   Final    NO GROWTH Performed at [facility], [address] [place], [place] [address]    Report Status 01/10/2020 FINAL  Final         Radiology Studies: CT ABDOMEN PELVIS W CONTRAST  Result Date: 01/16/2020 CLINICAL DATA:  Suspect bowel obstruction. Abdominal pain and distension. EXAM: CT ABDOMEN AND PELVIS WITH CONTRAST TECHNIQUE: Multidetector CT imaging of the abdomen and pelvis was performed using the standard protocol following bolus administration of intravenous contrast. Unable to move patient completely into scanner to cover the very superior aspect of the liver due to patient's severe claustrophobia. CONTRAST:  148mL OMNIPAQUE IOHEXOL 300 MG/ML  SOLN COMPARISON:  01/05/2020 FINDINGS: Lower chest: Lung bases are clear.  Mild cardiomegaly. Hepatobiliary: Liver and biliary tree are normal. There is a 2.4 cm stone over the gallbladder. Pancreas: Normal. Spleen: Normal. Adrenals/Urinary Tract: Adrenal glands are normal. Kidneys are normal in size without hydronephrosis or focal mass. No evidence of nephrolithiasis. Ureters are normal. Foley catheter is present within a decompressed bladder. Stomach/Bowel: Nasogastric tube is present with tip over the posterior gastric fundus. Air is present throughout the colon with fluid over the rectosigmoid colon. There are numerous air and fluid-filled dilated small bowel loops measuring up to 4.5 cm in diameter. No evidence of pneumatosis and no evidence of free peritoneal air. Findings suggest a distal early/partial small bowel obstruction and less likely ileus. No definite transition point visualized. Appendix not visualized. Vascular/Lymphatic: Abdominal aorta is normal caliber. No significant adenopathy.  Reproductive: Normal. Other: Tiny amount of free peritoneal fluid. Mild to moderate subcutaneous edema over the abdominal wall. Musculoskeletal: Degenerative change of the spine. IMPRESSION: 1. Numerous air and fluid-filled dilated small bowel loops measuring up to 4.5 cm in diameter with air throughout the colon. No definite transition point visualized. No evidence of pneumatosis or free peritoneal air. Findings suggest a distal early/partial small bowel obstruction and less likely ileus. 2. 2.4 cm gallstone unchanged. 3. Tubes and lines as described. 4. Moderate subcutaneous edema over the abdomen/pelvis. Electronically Signed   By: Marin Olp M.D.   On: 01/16/2020 16:46   DG Abd 2 Views  Result Date: 01/16/2020 CLINICAL DATA:  Worsening abdominal pain and distention. EXAM: ABDOMEN - 2 VIEW COMPARISON:  Multiple study since January 05, 2020 FINDINGS: Diffuse colonic dilatation is identified, similar in the interval. There are now dilated loops of small bowel, particularly in the left side of the abdomen. The small bowel dilatation  is apparently new. No free air, portal venous gas, or pneumatosis identified. IMPRESSION: 1. Diffuse colonic dilatation remains. There is now small bowel dilatation as well, which is a new finding, particular in the left side of the abdomen. The findings could represent ileus or distal colonic obstruction. Electronically Signed   By: Dorise Bullion III M.D   On: 01/16/2020 13:56        Scheduled Meds: . atenolol  25 mg Oral Daily  . Chlorhexidine Gluconate Cloth  6 each Topical Daily  . diltiazem  180 mg Oral Daily  . enoxaparin (LOVENOX) injection  60 mg Subcutaneous Daily  . furosemide  80 mg Intravenous BID  . gabapentin  300 mg Oral BID  . insulin aspart  0-5 Units Subcutaneous QHS  . insulin aspart  0-9 Units Subcutaneous TID WC  . insulin glargine  20 Units Subcutaneous Daily  . multivitamin with minerals  1 tablet Oral Daily  . pantoprazole  40 mg Oral  Daily  . phenazopyridine  100 mg Oral TID WC  . sodium chloride flush  3 mL Intravenous Q12H  . sodium chloride flush  3 mL Intravenous Q12H  . tamsulosin  0.4 mg Oral Daily   Continuous Infusions: . sodium chloride    . iron sucrose 100 mg (01/15/20 1143)  . potassium chloride 10 mEq (01/17/20 0160)          Aline August, MD [facility] 01/17/2020, 7:34 AM

## 2020-01-17 NOTE — Progress Notes (Signed)
Upon entering room, RN noticed that Patient had pulled NG Tube, IVs, and telemetry electrodes out. Patient able to answer orientation questions but had poor safety awareness as he kept pulling down side rails, foley catheter, and not allowing IV team to reinsert IV. Patient still need one more run of IV potassium. After IV reinsertion, this RN and RN Sharee Pimple attempted to reinsert NG Tube; however, patient refused. On-call [place] notified and will come and assess patient at bedside. RN applied mittens on bilateral hands to prevent patient from pulling down rails and new IV. Will continue to monitor closely.

## 2020-01-17 NOTE — Progress Notes (Signed)
Received pt to room 5N13 from [address].  No complaint of pain.. had a very large watery bm,  Pt cleaned up and continued his Potassium runs

## 2020-01-17 NOTE — Progress Notes (Addendum)
Upon shift assessment, pt c/o abd pain 10/10 and demanding to sit on the side of the bed. RN educated pt on the importance of staying in bed at this time after administering pain medication and d/t his increased level of drowsiness and fall risk. Pt continued demanding to sit on the side of the bed and becoming aggressive with staff. Pt moved to the side of th bed alone. Refusing to put on yellow fall risk socks. Bed alarm on. RN continues to educate pt and will continue to monitor.   White, MD came to bedside to assess pt and stated okay to give PO medications.

## 2020-01-17 NOTE — Progress Notes (Signed)
Handoff report given to [facility], Therapist, sports. Made them aware that potassium needs to be drawn when IVPB potasium is complete per Starla Link, MD

## 2020-01-17 NOTE — Progress Notes (Signed)
Subjective He pulled out lines and NG overnight. Denies n/v at this time. Denies flatus nor BM. Denies abdominal pain. Refusing multiple interventions but does appear to be alert/oriented x3 at this time  Objective: Vital signs in last 24 hours: Temp:  [97.4 F (36.3 C)-98.5 F (36.9 C)] 98.5 F (36.9 C) (11/22 0557) Pulse Rate:  [74-86] 86 (11/22 0817) Resp:  [18-20] 20 (11/22 0817) BP: (155-188)/(76-102) 155/99 (11/22 0817) SpO2:  [95 %-99 %] 96 % (11/22 0817) Weight:  [115.1 kg] 115.1 kg (11/22 0557) Last BM Date: 01/16/20  Intake/Output from previous day: 11/21 0701 - 11/22 0700 In: 1528.1 [P.O.:100; I.V.:190.9; NG/GT:550; IV Piggyback:687.3] Out: 2250 [Urine:1600; Emesis/NG output:650] Intake/Output this shift: No intake/output data recorded.  Gen: NAD, comfortable CV: RRR Pulm: Normal work of breathing Abd: Obese, moderately distended, nontender throughout; no rebound nor guarding Ext: SCDs in place  Lab Results: CBC  Recent Labs    01/16/20 0236  WBC 4.6  HGB 9.8*  HCT 31.0*  PLT 303   BMET Recent Labs    01/16/20 2206 01/17/20 0235  NA 138 140  K 2.4* 2.4*  CL 98 99  CO2 23 23  GLUCOSE 191* 186*  BUN 25* 28*  CREATININE 1.24 1.27*  CALCIUM 9.2 8.8*   PT/INR No results for input(s): LABPROT, INR in the last 72 hours. ABG No results for input(s): PHART, HCO3 in the last 72 hours.  Invalid input(s): PCO2, PO2  Studies/Results:  Anti-infectives: Anti-infectives (From admission, onward)   None       Assessment/Plan: Patient Active Problem List   Diagnosis Date Noted  . Nephrotic syndrome 01/06/2020  . Anasarca 01/06/2020  . Hypokalemia due to loss of potassium 01/04/2020  . Generalized body aches 01/01/2020  . Non-traumatic rhabdomyolysis 01/01/2020  . Grade I diastolic dysfunction 21/30/8657  . Anemia 01/01/2020  . Hypertensive urgency 12/31/2019  . Hypokalemia 12/31/2019  . Elevated CK   . Chronic diastolic CHF (congestive heart  failure) ([place]) 12/31/2017  . Poorly controlled diabetes mellitus ([place])   . Pure hypercholesterolemia   . Hyperglycemia 09/28/2017  . Nonobstructive atherosclerosis of coronary artery 09/28/2017  . MDD (major depressive disorder), recurrent episode, severe ([place]) 07/30/2017  . Osteoarthritis 04/12/2015  . Severe recurrent major depression with psychotic features ([place]) 01/04/2015  . Substance-related disorder ([place]) 10/30/2014  . Patient's noncompliance with other medical treatment and regimen 06/07/2014  . Cocaine dependence, uncomplicated ([place]) 84/69/6295  . Chronic pain 05/31/2014  . Cocaine abuse ([place]) 05/31/2014  . History of noncompliance with medical treatment 05/31/2014  . Malingering 05/31/2014  . Leukopenia 05/31/2014  . Diabetic neuropathy associated with diabetes mellitus due to underlying condition ([place]) 05/31/2014  . Diabetic neuropathy ([place]) 05/31/2014  . Congestive heart failure with left ventricular systolic dysfunction ([employer]) 02/22/2014  . Depression, major, recurrent, severe with psychosis ([place]) 08/29/2013  . Suicidal ideations 08/28/2013  . Uncontrolled diabetes mellitus ([place]) 06/30/2013  . Atypical chest pain 12/09/2011  . Hypertension 12/09/2011  . 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

## 2020-01-18 ENCOUNTER — Other Ambulatory Visit: Payer: Self-pay

## 2020-01-18 ENCOUNTER — Inpatient Hospital Stay (HOSPITAL_COMMUNITY): Payer: Medicare HMO

## 2020-01-18 DIAGNOSIS — E876 Hypokalemia: Secondary | ICD-10-CM | POA: Diagnosis not present

## 2020-01-18 DIAGNOSIS — I10 Essential (primary) hypertension: Secondary | ICD-10-CM | POA: Diagnosis not present

## 2020-01-18 DIAGNOSIS — N049 Nephrotic syndrome with unspecified morphologic changes: Secondary | ICD-10-CM | POA: Diagnosis not present

## 2020-01-18 DIAGNOSIS — I5189 Other ill-defined heart diseases: Secondary | ICD-10-CM | POA: Diagnosis not present

## 2020-01-18 LAB — COMPREHENSIVE METABOLIC PANEL
ALT: 16 U/L (ref 0–44)
AST: 21 U/L (ref 15–41)
Albumin: 2.2 g/dL — ABNORMAL LOW (ref 3.5–5.0)
Alkaline Phosphatase: 68 U/L (ref 38–126)
Anion gap: 11 (ref 5–15)
BUN: 27 mg/dL — ABNORMAL HIGH (ref 6–20)
CO2: 23 mmol/L (ref 22–32)
Calcium: 8.4 mg/dL — ABNORMAL LOW (ref 8.9–10.3)
Chloride: 108 mmol/L (ref 98–111)
Creatinine, Ser: 1.68 mg/dL — ABNORMAL HIGH (ref 0.61–1.24)
GFR, Estimated: 47 mL/min — ABNORMAL LOW (ref >60)
Glucose, Bld: 146 mg/dL — ABNORMAL HIGH (ref 70–99)
Potassium: 2.2 mmol/L — CL (ref 3.5–5.1)
Sodium: 142 mmol/L (ref 135–145)
Total Bilirubin: 1.1 mg/dL (ref 0.3–1.2)
Total Protein: 5.7 g/dL — ABNORMAL LOW (ref 6.5–8.1)

## 2020-01-18 LAB — POTASSIUM
Potassium: 2.5 mmol/L — CL (ref 3.5–5.1)
Potassium: 2.8 mmol/L — ABNORMAL LOW (ref 3.5–5.1)

## 2020-01-18 LAB — BASIC METABOLIC PANEL
Anion gap: 12 (ref 5–15)
BUN: 27 mg/dL — ABNORMAL HIGH (ref 6–20)
CO2: 23 mmol/L (ref 22–32)
Calcium: 8.5 mg/dL — ABNORMAL LOW (ref 8.9–10.3)
Chloride: 105 mmol/L (ref 98–111)
Creatinine, Ser: 1.63 mg/dL — ABNORMAL HIGH (ref 0.61–1.24)
GFR, Estimated: 49 mL/min — ABNORMAL LOW (ref >60)
Glucose, Bld: 149 mg/dL — ABNORMAL HIGH (ref 70–99)
Potassium: 2.4 mmol/L — CL (ref 3.5–5.1)
Sodium: 140 mmol/L (ref 135–145)

## 2020-01-18 LAB — CBC WITH DIFFERENTIAL/PLATELET
Abs Immature Granulocytes: 0.02 10*3/uL (ref 0.00–0.07)
Basophils Absolute: 0 10*3/uL (ref 0.0–0.1)
Basophils Relative: 1 %
Eosinophils Absolute: 0.3 10*3/uL (ref 0.0–0.5)
Eosinophils Relative: 8 %
HCT: 31.4 % — ABNORMAL LOW (ref 39.0–52.0)
Hemoglobin: 9.7 g/dL — ABNORMAL LOW (ref 13.0–17.0)
Immature Granulocytes: 1 %
Lymphocytes Relative: 22 %
Lymphs Abs: 1 10*3/uL (ref 0.7–4.0)
MCH: 25.3 pg — ABNORMAL LOW (ref 26.0–34.0)
MCHC: 30.9 g/dL (ref 30.0–36.0)
MCV: 82 fL (ref 80.0–100.0)
Monocytes Absolute: 0.7 10*3/uL (ref 0.1–1.0)
Monocytes Relative: 17 %
Neutro Abs: 2.3 10*3/uL (ref 1.7–7.7)
Neutrophils Relative %: 51 %
Platelets: 290 10*3/uL (ref 150–400)
RBC: 3.83 MIL/uL — ABNORMAL LOW (ref 4.22–5.81)
RDW: 15.2 % (ref 11.5–15.5)
WBC: 4.4 10*3/uL (ref 4.0–10.5)
nRBC: 0 % (ref 0.0–0.2)

## 2020-01-18 LAB — GLUCOSE, CAPILLARY
Glucose-Capillary: 144 mg/dL — ABNORMAL HIGH (ref 70–99)
Glucose-Capillary: 152 mg/dL — ABNORMAL HIGH (ref 70–99)
Glucose-Capillary: 139 mg/dL — ABNORMAL HIGH (ref 70–99)
Glucose-Capillary: 148 mg/dL — ABNORMAL HIGH (ref 70–99)

## 2020-01-18 LAB — PHOSPHORUS: Phosphorus: 3.6 mg/dL (ref 2.5–4.6)

## 2020-01-18 LAB — MAGNESIUM
Magnesium: 1.6 mg/dL — ABNORMAL LOW (ref 1.7–2.4)
Magnesium: 2.1 mg/dL (ref 1.7–2.4)

## 2020-01-18 LAB — AMMONIA: Ammonia: 39 umol/L — ABNORMAL HIGH (ref 9–35)

## 2020-01-18 LAB — FOLATE: Folate: 9.4 ng/mL (ref >5.9)

## 2020-01-18 LAB — TSH: TSH: 2.317 u[IU]/mL (ref 0.350–4.500)

## 2020-01-18 LAB — VITAMIN B12: Vitamin B-12: 545 pg/mL (ref 180–914)

## 2020-01-18 MED ORDER — POTASSIUM CHLORIDE CRYS ER 20 MEQ PO TBCR
40.0000 meq | EXTENDED_RELEASE_TABLET | Freq: Once | ORAL | Status: AC
  Administered 2020-01-18: 40 meq via ORAL
  Filled 2020-01-18: qty 2

## 2020-01-18 MED ORDER — MAGNESIUM SULFATE 2 GM/50ML IV SOLN
2.0000 g | Freq: Once | INTRAVENOUS | Status: AC
  Administered 2020-01-18: 2 g via INTRAVENOUS
  Filled 2020-01-18: qty 50

## 2020-01-18 MED ORDER — POTASSIUM CHLORIDE 10 MEQ/100ML IV SOLN
10.0000 meq | INTRAVENOUS | Status: AC
  Administered 2020-01-18 (×6): 10 meq via INTRAVENOUS
  Filled 2020-01-18 (×5): qty 100

## 2020-01-18 MED ORDER — POTASSIUM CHLORIDE 10 MEQ/100ML IV SOLN
10.0000 meq | INTRAVENOUS | Status: AC
  Administered 2020-01-18 (×3): 10 meq via INTRAVENOUS
  Filled 2020-01-18 (×3): qty 100

## 2020-01-18 MED ORDER — POTASSIUM CHLORIDE CRYS ER 20 MEQ PO TBCR
40.0000 meq | EXTENDED_RELEASE_TABLET | ORAL | Status: AC
  Administered 2020-01-18 (×2): 40 meq via ORAL
  Filled 2020-01-18 (×2): qty 2

## 2020-01-18 MED ORDER — POTASSIUM CHLORIDE CRYS ER 20 MEQ PO TBCR
40.0000 meq | EXTENDED_RELEASE_TABLET | Freq: Once | ORAL | Status: AC
  Administered 2020-01-18 (×2): 40 meq via ORAL
  Filled 2020-01-18: qty 2

## 2020-01-18 MED ORDER — POLYETHYLENE GLYCOL 3350 17 G PO PACK
17.0000 g | PACK | Freq: Two times a day (BID) | ORAL | Status: DC
  Administered 2020-01-18 – 2020-01-19 (×3): 17 g via ORAL
  Filled 2020-01-18 (×5): qty 1

## 2020-01-18 MED ORDER — POTASSIUM CHLORIDE 10 MEQ/100ML IV SOLN
10.0000 meq | INTRAVENOUS | Status: AC
  Administered 2020-01-18 (×2): 10 meq via INTRAVENOUS
  Filled 2020-01-18 (×2): qty 100

## 2020-01-18 NOTE — Progress Notes (Signed)
Subjective He pulled out lines and NG overnight. Denies n/v at this time. Now reports he is passing lots of flatus and has BM but remains distended. Denies abdominal pain. Refusing multiple interventions but does appear to be alert/oriented x3 at this time  Objective: Vital signs in last 24 hours: Temp:  [97.9 F (36.6 C)-98.5 F (36.9 C)] 98.2 F (36.8 C) (11/23 0731) Pulse Rate:  [68-76] 68 (11/23 0731) Resp:  [17-20] 17 (11/23 0731) BP: (155-168)/(84-97) 155/90 (11/23 0731) SpO2:  [95 %-98 %] 97 % (11/23 0731) Weight:  [111.4 kg] 111.4 kg (11/23 0604) Last BM Date: 01/17/20  Intake/Output from previous day: 11/22 0701 - 11/23 0700 In: 1569.6 [I.V.:1184.6; IV Piggyback:385] Out: 1325 [Urine:1325] Intake/Output this shift: No intake/output data recorded.  Gen: NAD, comfortable CV: RRR Pulm: Normal work of breathing Abd: Obese, moderately distended, nontender throughout; no rebound nor guarding Ext: SCDs in place  Lab Results: CBC  Recent Labs    01/16/20 0236 01/18/20 0517  WBC 4.6 4.4  HGB 9.8* 9.7*  HCT 31.0* 31.4*  PLT 303 290   BMET Recent Labs    01/18/20 0016 01/18/20 0517  NA 140 142  K 2.4* 2.2*  CL 105 108  CO2 23 23  GLUCOSE 149* 146*  BUN 27* 27*  CREATININE 1.63* 1.68*  CALCIUM 8.5* 8.4*   PT/INR No results for input(s): LABPROT, INR in the last 72 hours. ABG No results for input(s): PHART, HCO3 in the last 72 hours.  Invalid input(s): PCO2, PO2  Studies/Results:  Anti-infectives: Anti-infectives (From admission, onward)   None       Assessment/Plan: Patient Active Problem List   Diagnosis Date Noted  . Palliative care by specialist   . Goals of care, counseling/discussion   . Nephrotic syndrome 01/06/2020  . Anasarca 01/06/2020  . Hypokalemia due to loss of potassium 01/04/2020  . Generalized body aches 01/01/2020  . Non-traumatic rhabdomyolysis 01/01/2020  . Grade I diastolic dysfunction 66/59/9357  . Anemia 01/01/2020   . Hypertensive urgency 12/31/2019  . Hypokalemia 12/31/2019  . Elevated CK   . Chronic diastolic CHF (congestive heart failure) ([place]) 12/31/2017  . Poorly controlled diabetes mellitus ([place])   . Pure hypercholesterolemia   . Hyperglycemia 09/28/2017  . Nonobstructive atherosclerosis of coronary artery 09/28/2017  . MDD (major depressive disorder), recurrent episode, severe ([place]) 07/30/2017  . Osteoarthritis 04/12/2015  . Severe recurrent major depression with psychotic features ([place]) 01/04/2015  . Substance-related disorder ([place]) 10/30/2014  . Patient's noncompliance with other medical treatment and regimen 06/07/2014  . Cocaine dependence, uncomplicated ([place]) 01/77/9390  . Chronic pain 05/31/2014  . Cocaine abuse ([place]) 05/31/2014  . History of noncompliance with medical treatment 05/31/2014  . Malingering 05/31/2014  . Leukopenia 05/31/2014  . Diabetic neuropathy associated with diabetes mellitus due to underlying condition ([place]) 05/31/2014  . Diabetic neuropathy ([place]) 05/31/2014  . Congestive heart failure with left ventricular systolic dysfunction ([place]) 02/22/2014  . Depression, major, recurrent, severe with psychosis ([place]) 08/29/2013  . Suicidal ideations 08/28/2013  . Uncontrolled diabetes mellitus ([place]) 06/30/2013  . Atypical chest pain 12/09/2011  . Hypertension 12/09/2011  . Episodic mood disorder ([place]) 05/13/2011   Mr. 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 Psych reports capacity. Will defer to GI regarding PO status in event they pursue endoscopic decompression -  Replace electrolytes, avoid narcotics, noted significant diuretic recently -Ok for miralax -No plans for surgical intervention at this juncture   LOS: 14 days   Nadeen Landau, MD [facility] Use  [email or web address] to contact on call provider

## 2020-01-18 NOTE — Progress Notes (Addendum)
Patient ID: [name], male   DOB: October 02, 1960, 59 y.o.   MRN: [id number]  PROGRESS NOTE    BALEY LORIMER  VHQ:[id number] DOB: Mar 18, 1960 DOA: 12/31/2019 PCP: [name], NP   Brief Narrative:  59 y.o. malewith medical history significant forhypertension, poorly controlled diabetes mellitus, chronic leukopenia, history of CVA, and history of substance abuse presented secondary to body aches with evidence of mild rhabdomyolysis and volume overload, found to have nephrotic syndrome and AKI. Started on IV diuresis.  Nephrology was consulted.  During the hospitalization, patient diuresed well and nephrology subsequently signed off.  Patient has had issues with abdominal distention and ileus with worsening hypokalemia.  He has intermittently refused treatments including potassium supplementation; has removed IV lines few times and also removed NG tube.  General surgery has been consulted for ileus.  Palliative care has also been consulted for goals of care discussion.  Assessment & Plan:   Nephrotic syndrome -Urinalysis significant for large protein/glucose with a urine protein/creatinine of 3.51. Serum albumin of around 2. Albumin IV given.  -Patient was on Lasix drip till 01/16/2020 and subsequently was switched to oral Lasix by nephrology.  Nephrology signed off on 01/16/2020.  Lasix was switched to IV again on the same day because patient was made n.p.o. per general surgery recommendations.  Lasix discontinued on 01/17/2020 because of severe hypokalemia. -Daily weights/ strict input and output.  Negative balance of 20,021.1 cc since admission.  Ileus -His abdominal distention and pain worsened on 01/16/2020.  X-ray of the abdomen showed persistent colonic dilatation with new small bowel dilatation as well.  CT of the abdomen and pelvis with contrast suggested distal early/partial small bowel obstruction and less likely ileus.  general surgery was consulted.  NG tube was placed but patient  subsequently has pulled NG tube early next morning. -Follow further general surgery recommendations regarding need for NG tube.  Continue n.p.o.  Continue IV fluids and analgesics. -Spoke to Dr. Williams Che on 01/17/2020 who recommended conservative management and aggressive potassium replacement and no need for any GI procedures.  AKI on CKD stage III AKI secondary to hypertensive emergency/ACE inhibitor use. Baseline creatinine of about 1.1. benazepril held. No hydronephrosis on renal ultrasound.  -Creatinine 1.68 today.  Continue IV fluids.  Hypokalemia -Continue aggressive potassium replacement and check potassium again later this afternoon. -Lasix discontinued on 01/17/2020.  Hypomagnesemia--replace.  Repeat a.m. labs  Intermittent agitation Generalized conditioning -Patient gets intermittently agitated and has pulled out his IV lines, NG tube, Foley catheter. -Use Haldol as needed.  Psychiatry consultation appreciated: Psychiatry thinks that patient has mental capacity to make informed decisions which include the right to refuse or accept treatment. -Palliative care evaluation appreciated: CODE STATUS was changed to DNR by palliative care team on 41/32/4401  Chronic diastolic heart dysfunction -Stable.  Primary hypertension Hypertensive emergency on admission. Blood pressure still elevated at times. -Continue atenolol and diltiazem  Anemia Normocytic. Anemia panel suggests possible mixed picture. Stable. No evidence of bleed. In setting of diarrhea as well as chronic kidney disease.  Diabetes mellitus, type 2 Uncontrolled with hyperglycemia and hypoglycemia Patient is on Lantus 40 units qhs and Januemet as an outpatient. -Continue Lantus 20 units daily and continue SSI  Bicuspid aortic valve Moderate aortic valve stenosis Associated LVH without evidence of LV systolic dysfunction. No LV dilation. -Cardiology follow-up outpatient  Urinary retention Bladder scan on  11/11 was significant for 69mL+ of retained urine. Continued urinary retention and patient continued to refuse foley placement until 11/14.  Foley placed successfully on 11/14 -will need outpatient urology evaluation  Chest pain Likely reflux related but patient does have evidence of cholelithiasis with stone located in the neck of the gallbladder. Chest pain resolved -Continue Protonix   DVT prophylaxis: Lovenox Code Status:    DNR Family Communication: None at bedside Disposition Plan: Status is: Inpatient  Remains inpatient appropriate because:Inpatient level of care appropriate due to severity of illness.  Discharge to SNF once cleared by nephrology   Dispo: The patient is from: Home              Anticipated d/c is to: SNF              Anticipated d/c date is: > 3 days              Patient currently is not medically stable to d/c.   Consultants: Nephrology/general surgery/spoke to GI on phone/palliative care/psychiatry  Procedures: Echo showed EF of 65 to 70% with grade 1 diastolic dysfunction  Antimicrobials: None   Subjective: Patient seen and examined at bedside.  Very poor historian.  Wakes up slightly, does not participate in conversation much.  No overnight fever or vomiting reported. Objective: Vitals:   01/17/20 1710 01/18/20 0415 01/18/20 0604 01/18/20 0731  BP: (!) 168/97 (!) 156/84  (!) 155/90  Pulse: 76 70  68  Resp: 20 19  17   Temp: 98.1 F (36.7 C) 97.9 F (36.6 C)  98.2 F (36.8 C)  TempSrc: Oral Oral  Oral  SpO2: 96% 98%  97%  Weight:   111.4 kg   Height:        Intake/Output Summary (Last 24 hours) at 01/18/2020 0751 Last data filed at 01/18/2020 0300 Gross per 24 hour  Intake 1569.55 ml  Output 1325 ml  Net 244.55 ml   Filed Weights   01/16/20 0636 01/17/20 0557 01/18/20 0604  Weight: 110 kg 115.1 kg 111.4 kg    Examination:  General exam: Very poor historian.  No acute distress.  Currently still on room air Respiratory system:  Decreased breath sounds at bases bilaterally with some scattered crackles  cardiovascular system: S1-S2 heard, rate controlled gastrointestinal system: Abdomen is morbidly obese, still very distended, soft and tender diffusely.  Sluggish bowel sounds extremities: No clubbing.  Bilateral lower extremity pain present Central nervous system: Extremely poor historian.  Hardly participates in any conversation.  No focal neurological deficits.  Moving extremities Skin: No obvious petechiae/rashes psychiatry: Flat affect  Data Reviewed: I have personally reviewed following labs and imaging studies  CBC: Recent Labs  Lab 01/13/20 0706 01/14/20 0507 01/16/20 0236 01/18/20 0517  WBC 3.5* 4.4 4.6 4.4  NEUTROABS 1.9 2.3 2.5 2.3  HGB 10.2* 9.9* 9.8* 9.7*  HCT 33.4* 31.7* 31.0* 31.4*  MCV 81.1 80.7 81.6 82.0  PLT 310 280 303 366   Basic Metabolic Panel: Recent Labs  Lab 01/14/20 0507 01/14/20 0507 01/15/20 0215 01/15/20 0215 01/16/20 0236 01/16/20 0236 01/16/20 2206 01/17/20 0235 01/17/20 1403 01/18/20 0016 01/18/20 0517  NA 137   < > 138   < > 139   < > 138 140 140 140 142  K 3.0*   < > 2.7*   < > 2.4*   < > 2.4* 2.4* 2.2* 2.4* 2.2*  CL 104   < > 102   < > 103   < > 98 99 104 105 108  CO2 24   < > 25   < > 28   < > 23  23 25 23 23   GLUCOSE 130*   < > 135*   < > 126*   < > 191* 186* 166* 149* 146*  BUN 42*   < > 39*   < > 29*   < > 25* 28* 27* 27* 27*  CREATININE 1.73*   < > 1.43*   < > 1.26*   < > 1.24 1.27* 1.44* 1.63* 1.68*  CALCIUM 9.0   < > 8.9   < > 9.0   < > 9.2 8.8* 8.6* 8.5* 8.4*  MG 2.1  --   --   --   --   --   --   --   --   --  1.6*  PHOS 5.0*  --  3.9  --  3.5  --   --  4.8*  --   --  3.6   < > = values in this interval not displayed.   GFR: Estimated Creatinine Clearance: 58 mL/min (A) (by C-G formula based on SCr of 1.68 mg/dL (H)). Liver Function Tests: Recent Labs  Lab 01/14/20 0507 01/15/20 0215 01/16/20 0236 01/17/20 0235 01/18/20 0517  AST  --   --    --   --  21  ALT  --   --   --   --  16  ALKPHOS  --   --   --   --  68  BILITOT  --   --   --   --  1.1  PROT  --   --   --   --  5.7*  ALBUMIN 2.5* 2.2* 2.1* 2.1* 2.2*   No results for input(s): LIPASE, AMYLASE in the last 168 hours. Recent Labs  Lab 01/11/20 1053 01/18/20 0517  AMMONIA 35 39*   Coagulation Profile: No results for input(s): INR, PROTIME in the last 168 hours. Cardiac Enzymes: No results for input(s): CKTOTAL, CKMB, CKMBINDEX, TROPONINI in the last 168 hours. BNP (last 3 results) No results for input(s): PROBNP in the last 8760 hours. HbA1C: No results for input(s): HGBA1C in the last 72 hours. CBG: Recent Labs  Lab 01/16/20 2046 01/17/20 0818 01/17/20 1055 01/17/20 1619 01/17/20 2115  GLUCAP 184* 175* 192* 148* 139*   Lipid Profile: No results for input(s): CHOL, HDL, LDLCALC, TRIG, CHOLHDL, LDLDIRECT in the last 72 hours. Thyroid Function Tests: Recent Labs    01/18/20 0517  TSH 2.317   Anemia Panel: Recent Labs    01/18/20 0517  VITAMINB12 545  FOLATE 9.4   Sepsis Labs: No results for input(s): PROCALCITON, LATICACIDVEN in the last 168 hours.  Recent Results (from the past 240 hour(s))  Culture, Urine     Status: None   Collection Time: 01/09/20  3:45 PM   Specimen: Urine, Catheterized  Result Value Ref Range Status   Specimen Description URINE, CATHETERIZED  Final   Special Requests NONE  Final   Culture   Final    NO GROWTH Performed at [facility], [address] [place], [place] [address]    Report Status 01/10/2020 FINAL  Final  Culture, Urine     Status: None   Collection Time: 01/16/20  4:49 PM   Specimen: Urine, Random  Result Value Ref Range Status   Specimen Description URINE, RANDOM  Final   Special Requests NONE  Final   Culture   Final    NO GROWTH Performed at [facility], [place] [address] [place], [place] [address]    Report Status  01/17/2020 FINAL  Final         Radiology Studies: CT  ABDOMEN PELVIS W CONTRAST  Result Date: 01/16/2020 CLINICAL DATA:  Suspect bowel obstruction. Abdominal pain and distension. EXAM: CT ABDOMEN AND PELVIS WITH CONTRAST TECHNIQUE: Multidetector CT imaging of the abdomen and pelvis was performed using the standard protocol following bolus administration of intravenous contrast. Unable to move patient completely into scanner to cover the very superior aspect of the liver due to patient's severe claustrophobia. CONTRAST:  123mL OMNIPAQUE IOHEXOL 300 MG/ML  SOLN COMPARISON:  01/05/2020 FINDINGS: Lower chest: Lung bases are clear.  Mild cardiomegaly. Hepatobiliary: Liver and biliary tree are normal. There is a 2.4 cm stone over the gallbladder. Pancreas: Normal. Spleen: Normal. Adrenals/Urinary Tract: Adrenal glands are normal. Kidneys are normal in size without hydronephrosis or focal mass. No evidence of nephrolithiasis. Ureters are normal. Foley catheter is present within a decompressed bladder. Stomach/Bowel: Nasogastric tube is present with tip over the posterior gastric fundus. Air is present throughout the colon with fluid over the rectosigmoid colon. There are numerous air and fluid-filled dilated small bowel loops measuring up to 4.5 cm in diameter. No evidence of pneumatosis and no evidence of free peritoneal air. Findings suggest a distal early/partial small bowel obstruction and less likely ileus. No definite transition point visualized. Appendix not visualized. Vascular/Lymphatic: Abdominal aorta is normal caliber. No significant adenopathy. Reproductive: Normal. Other: Tiny amount of free peritoneal fluid. Mild to moderate subcutaneous edema over the abdominal wall. Musculoskeletal: Degenerative change of the spine. IMPRESSION: 1. Numerous air and fluid-filled dilated small bowel loops measuring up to 4.5 cm in diameter with air throughout the colon. No definite transition point visualized. No evidence of pneumatosis or free peritoneal air. Findings  suggest a distal early/partial small bowel obstruction and less likely ileus. 2. 2.4 cm gallstone unchanged. 3. Tubes and lines as described. 4. Moderate subcutaneous edema over the abdomen/pelvis. Electronically Signed   By: Marin Olp M.D.   On: 01/16/2020 16:46   DG Abd 2 Views  Result Date: 01/16/2020 CLINICAL DATA:  Worsening abdominal pain and distention. EXAM: ABDOMEN - 2 VIEW COMPARISON:  Multiple study since January 05, 2020 FINDINGS: Diffuse colonic dilatation is identified, similar in the interval. There are now dilated loops of small bowel, particularly in the left side of the abdomen. The small bowel dilatation is apparently new. No free air, portal venous gas, or pneumatosis identified. IMPRESSION: 1. Diffuse colonic dilatation remains. There is now small bowel dilatation as well, which is a new finding, particular in the left side of the abdomen. The findings could represent ileus or distal colonic obstruction. Electronically Signed   By: Dorise Bullion III M.D   On: 01/16/2020 13:56        Scheduled Meds: . atenolol  25 mg Oral Daily  . Chlorhexidine Gluconate Cloth  6 each Topical Daily  . diltiazem  180 mg Oral Daily  . enoxaparin (LOVENOX) injection  60 mg Subcutaneous Daily  . insulin aspart  0-5 Units Subcutaneous QHS  . insulin aspart  0-9 Units Subcutaneous TID WC  . insulin glargine  10 Units Subcutaneous Daily  . pantoprazole (PROTONIX) IV  40 mg Intravenous Q24H  . potassium chloride  40 mEq Oral Once  . sodium chloride flush  3 mL Intravenous Q12H  . sodium chloride flush  3 mL Intravenous Q12H  . tamsulosin  0.4 mg Oral Daily   Continuous Infusions: . sodium chloride    . 0.9 % NaCl with KCl  40 mEq / L 75 mL/hr at 01/17/20 2352  . magnesium sulfate bolus IVPB 2 g (01/18/20 0656)  . potassium chloride 10 mEq (01/18/20 0727)  . potassium chloride            Aline August, MD [facility] 01/18/2020, 7:51 AM

## 2020-01-18 NOTE — Progress Notes (Signed)
Critical lab of K+ 2.5. Made Dr Remi Haggard aware via  Text,  More runs of k+ will be adm

## 2020-01-18 NOTE — Progress Notes (Signed)
Physical Therapy Treatment Patient Details Name: [name] MRN: [id number] DOB: 1960/03/31 Today's Date: 01/18/2020    History of Present Illness 59 y.o. male with medical history significant for hypertension, poorly controlled diabetes mellitus, chronic leukopenia, history of CVA, and history of substance abuse, now presenting to the emergency department for evaluation of aches. He has not taken any of his medicines in a couple days at least, and has developed cramping pain mainly in his legs. Pt found to be hypertensive, hypokalemic and nephrotic syndrome.    PT Comments    Pt supine on arrival, drowsy but easily awoken and with improved alertness once EOB. Pt anxious regarding mobility and slow/hesitant to initiate and perform mobility tasks, but able to progress to EOB with +45modA and heavy use of bed features. Pt performed sit<>stand from elevated bed height to [facility] with +72modA and return to supine with +65modA for BLE support. Pt able to perform posterior supine scoot with bed in trendelenburg with +35maxA and use of overhead rails. Pt continues to benefit from PT services to progress toward functional mobility goals. Continue to recommend SNF level of rehab.  Follow Up Recommendations  SNF     Equipment Recommendations  Other (comment);Hospital bed (mechanical lift, hospital bed)    Recommendations for Other Services       Precautions / Restrictions Precautions Precautions: Fall Precaution Comments: Increased edema limits ROM of joints Restrictions Weight Bearing Restrictions: No    Mobility  Bed Mobility Overal bed mobility: Needs Assistance Bed Mobility: Sit to Supine;Supine to Sit     Supine to sit: Mod assist;HOB elevated Sit to supine: +2 for physical assistance;Mod assist   General bed mobility comments: pt with heavy use of bed features and HOB elevated, needs modA for BLE assist and bed pad assist for hip translation  Transfers Overall transfer level: Needs  assistance Equipment used: Ambulation equipment used Transfers: Sit to/from Stand Sit to Stand: +2 physical assistance;Mod assist;From elevated surface         General transfer comment: stood x2 from elevated bed height to [place], pt dependent for peri-care after standing, able to stand 1-2 minutes at a time prior to requesting to sit; unable to attempt weight shifting in stance 2/2 BLE weakness/fatigue  Ambulation/Gait Ambulation/Gait assistance:  (NT- Unable. Functional transfers remain taxing 2/2 fatigue)               Stairs             Wheelchair Mobility    Modified Rankin (Stroke Patients Only)       Balance Overall balance assessment: Needs assistance Sitting-balance support: Feet supported;Single extremity supported Sitting balance-Leahy Scale: Fair Sitting balance - Comments: pt leaning heavily on RUE/bed rail seated EOB, tolerated sitting ~10 mins total pre/post transfers   Standing balance support: Bilateral upper extremity supported;During functional activity Standing balance-Leahy Scale: Poor Standing balance comment: [place] and +2 min for static standing, pt leaning forward over [place] rail and at times resting forearms on stedy rail while pads adjusted behind him                            Cognition Arousal/Alertness: Awake/alert Behavior During Therapy: Anxious;Flat affect Overall Cognitive Status: No family/caregiver present to determine baseline cognitive functioning                                 General Comments:  Pt has difficulty with active listening. He requires failed attempts to follow commands to achieve mobility tasks and very slow to process and initiate mobility tasks.      Exercises General Exercises - Lower Extremity Long Arc Quad: AAROM;Strengthening;Both;10 reps;Seated    General Comments General comments (skin integrity, edema, etc.): significant abdominal distension and BLE edema; pt reports fatigue  prior to mobility but agreeable with max encouragement      Pertinent Vitals/Pain Pain Assessment: 0-10 Pain Score: 8  Pain Location: abdominal pain (anterior/LLQ abdomen) Pain Descriptors / Indicators: Guarding;Grimacing;Tightness Pain Intervention(s): Monitored during session;Premedicated before session;Repositioned    Home Living                      Prior Function            PT Goals (current goals can now be found in the care plan section) Acute Rehab PT Goals Patient Stated Goal: to get back to bed. PT Goal Formulation: With patient Time For Goal Achievement: 01/19/20 Potential to Achieve Goals: Fair Progress towards PT goals: Progressing toward goals    Frequency    Min 2X/week      PT Plan Current plan remains appropriate    Co-evaluation              AM-PAC PT "6 Clicks" Mobility   Outcome Measure  Help needed turning from your back to your side while in a flat bed without using bedrails?: A Lot Help needed moving from lying on your back to sitting on the side of a flat bed without using bedrails?: A Lot Help needed moving to and from a bed to a chair (including a wheelchair)?: Total Help needed standing up from a chair using your arms (e.g., wheelchair or bedside chair)?: A Lot Help needed to walk in hospital room?: Total Help needed climbing 3-5 steps with a railing? : Total 6 Click Score: 9    End of Session Equipment Utilized During Treatment: Gait belt Activity Tolerance: Patient tolerated treatment well Patient left: in bed;with call bell/phone within reach;with bed alarm set Nurse Communication: Mobility status;Need for lift equipment PT Visit Diagnosis: Other abnormalities of gait and mobility (R26.89);Muscle weakness (generalized) (M62.81);Difficulty in walking, not elsewhere classified (R26.2)     Time: 1424-1500 PT Time Calculation (min) (ACUTE ONLY): 36 min  Charges:  $Therapeutic Exercise: 8-22 mins $Therapeutic  Activity: 8-22 mins                     Denilson Salminen P., PTA Acute Rehabilitation Services Pager: [phone] Office: [place] 01/18/2020, 3:21 PM

## 2020-01-18 NOTE — Progress Notes (Signed)
Nutrition Follow-up  DOCUMENTATION CODES:   Not applicable  INTERVENTION:   -RD will follow for diet advancement and add supplements as appropriate -If prolonged NPO status is anticipated, consider initiation of nutrition support  NUTRITION DIAGNOSIS:   Inadequate oral intake related to nausea, vomiting, poor appetite as evidenced by meal completion < 50%.  Ongoing  GOAL:   Patient will meet greater than or equal to 90% of their needs  Progressing   MONITOR:   PO intake, Supplement acceptance, Labs, Skin  REASON FOR ASSESSMENT:   LOS    ASSESSMENT:   59 yo male admitted with mild rhabdomyolysis, nephrotic syndrome, AKI. PMH includes HTN, DM, Bipolar affective disorder, CHF, chronic leukopenia, CVA, substance abuse (cocaine).  11/18- abdominal x-ray reveals persistent diffuse gaseous colonic distention 11/21- s/p repeat abdominal x-ray- reveals colonic pseudo-obstruction or ileus and not a mechanical obstruction, NGT placed, NPO  Reviewed I/O's: +244 ml x 24 hours and -20 L since 01/04/20  UOP: 1.3 L x 24 hours  Attempted to speak with pt via call to hospital room phone, however, unable to reach.   Pt pulled out NGT overnight and is requesting to eat (per general surgery notes, plan for GI consult prior to advancing to PO diet). Pt has been refusing care and pulling out lines and NGT. No plans for surgical interventions at this time. Palliative care following for goals of care discussions.   Medications reviewed and include cardizem, lovenox, and KCl.   Labs reviewed: K: 2.2 (on PO supplementation), Phos WDL, Mg: 1.6, CBGS: 139-148 (inpatient orders for glycemic control are 0-5 units insulin aspart daily at bedtime, 0-9 units insulin aspart TID with meals, and 10 units inuslin glargine daily).   Diet Order:   Diet Order            Diet NPO time specified Except for: Sips with Meds, Ice Chips  Diet effective now                 EDUCATION NEEDS:   Not  appropriate for education at this time  Skin:  Skin Assessment: Reviewed RN Assessment (open wound to penis)  Last BM:  01/18/20  Height:   Ht Readings from Last 1 Encounters:  01/01/20 5\' 8"  (1.727 m)    Weight:   Wt Readings from Last 1 Encounters:  01/18/20 111.4 kg    Ideal Body Weight:  70 kg  BMI:  Body mass index is 37.34 kg/m.  Estimated Nutritional Needs:   Kcal:  2200-2400  Protein:  110-130 gm  Fluid:  2.2-2.4 L    Loistine Chance, RD, LDN, [place] Registered Dietitian II Certified Diabetes Care and Education Specialist Please refer to [facility] for RD and/or RD on-call/weekend/after hours pager

## 2020-01-18 NOTE — Plan of Care (Signed)
  Problem: Education: Goal: Knowledge of General Education information will improve Description: Including pain rating scale, medication(s)/side effects and non-pharmacologic comfort measures Outcome: Progressing   Problem: Health Behavior/Discharge Planning: Goal: Ability to manage health-related needs will improve Outcome: Progressing   Problem: Activity: Goal: Risk for activity intolerance will decrease Outcome: Progressing   Problem: Nutrition: Goal: Adequate nutrition will be maintained Outcome: Progressing   Problem: Pain Managment: Goal: General experience of comfort will improve Outcome: Progressing   Problem: Safety: Goal: Ability to remain free from injury will improve Outcome: Progressing   

## 2020-01-18 NOTE — Plan of Care (Signed)
  Problem: Education: Goal: Knowledge of General Education information will improve Description Including pain rating scale, medication(s)/side effects and non-pharmacologic comfort measures Outcome: Progressing   

## 2020-01-18 NOTE — Progress Notes (Addendum)
CRITICAL VALUE ALERT  Critical Value: Potassium 2.4  Date & Time Notied:  01/18/2020 0133              01/18/2020 0548  Provider Notified: Stark Klein NP  Orders Received/Actions taken:

## 2020-01-18 NOTE — Consult Note (Addendum)
Referring Provider: TH/[facility]S Primary Care Physician:  [name], NP Primary Gastroenterologist: Althia Forts  Reason for Consultation: Ileus  HPI: [name] is a 59 y.o. male with past medical history of bipolar disorder, diabetes, chronic leukopenia, history of CVA, history of substance abuse and CHF with recent EF showing normal ejection fraction was admitted to the hospital on number 07/15/2019 with leg cramps.Marland Kitchen Upon initial evaluation, he was found to have a potassium of 2.5. He was also found to have a elevated blood pressure of 220/120. During hospitalization, he subsequently developed abdominal distention. Had CT abdomen pelvis with contrast on January 06, 2020 which showed air distended colon without evidence of obstruction or twisting. Segmental thickening of the distal transverse colon could be related to underdistention. General surgery was consulted. They have recommended colonic decompression. Patient was treated conservatively with NG tube suction. Repeat CT scan yesterday showed numerous air fluid level in the dilated small bowel loops could be from distal partial small bowel obstruction. Also showed air present throughout the colon with fluid over the rectosigmoid colon.  Patient seen and examined at bedside.  He continues to complain about abdominal distention.  Had 3 bowel movements this morning according to RN.  Liquid stool.  Had some nausea but denies any vomiting.  He feels hungry and wants to eat something.   Past Medical History:  Diagnosis Date  . Arthritis   . Bipolar affective ([place])   . CHF (congestive heart failure) ([place])   . Depression   . Diabetes mellitus    uncontrolled   . Hypertension     Past Surgical History:  Procedure Laterality Date  . KNEE SURGERY     b/l knees; pending total knee     Prior to Admission medications   Medication Sig Start Date End Date Taking? Authorizing Provider  atenolol (TENORMIN) 25 MG tablet Take 1 tablet (25 mg  total) by mouth daily. 12/29/19 01/28/20 Yes [name], NP  benazepril (LOTENSIN) 40 MG tablet Take 40 mg by mouth daily.   Yes [provider]  bismuth subsalicylate (PEPTO BISMOL) 262 MG/15ML suspension Take 30 mLs by mouth every 6 (six) hours as needed for indigestion.   Yes [provider]  diltiazem (CARDIZEM CD) 180 MG 24 hr capsule Take 1 capsule (180 mg total) by mouth daily. 12/29/19  Yes [name], NP  furosemide (LASIX) 40 MG tablet Take 1 tablet (40 mg total) by mouth 2 (two) times daily. 12/29/19 01/28/20 Yes [name], NP  gabapentin (NEURONTIN) 800 MG tablet Take 800 mg by mouth 2 (two) times daily.   Yes [provider]  ibuprofen (ADVIL) 200 MG tablet Take 200 mg by mouth every 6 (six) hours as needed for moderate pain.   Yes [provider]  insulin glargine (LANTUS SOLOSTAR) 100 UNIT/ML Solostar Pen Inject 40 Units into the skin at bedtime. 12/29/19  Yes [name], NP  potassium chloride SA (KLOR-CON) 20 MEQ tablet Take 1 tablet (20 mEq total) by mouth daily. 12/29/19  Yes [name], NP  sitaGLIPtin-metformin (JANUMET) 50-1000 MG tablet Take 1 tablet by mouth 2 (two) times daily with a meal. 12/29/19  Yes [name], NP  amLODipine (NORVASC) 10 MG tablet Take 10 mg by mouth daily.  Patient not taking: Reported on 12/29/2019 12/08/17 12/29/19  [provider]    Scheduled Meds: . atenolol  25 mg Oral Daily  . Chlorhexidine Gluconate Cloth  6 each Topical Daily  .  diltiazem  180 mg Oral Daily  . enoxaparin (LOVENOX) injection  60 mg Subcutaneous Daily  . insulin aspart  0-5 Units Subcutaneous QHS  . insulin aspart  0-9 Units Subcutaneous TID WC  . insulin glargine  10 Units Subcutaneous Daily  . pantoprazole (PROTONIX) IV  40 mg Intravenous Q24H  . potassium chloride  40 mEq Oral Once  . sodium chloride flush  3 mL Intravenous Q12H  . sodium chloride flush  3 mL Intravenous Q12H  .  tamsulosin  0.4 mg Oral Daily   Continuous Infusions: . sodium chloride    . 0.9 % NaCl with KCl 40 mEq / L 75 mL/hr at 01/17/20 2352  . potassium chloride     PRN Meds:.sodium chloride, acetaminophen **OR** acetaminophen, alum & mag hydroxide-simeth, bisacodyl, fentaNYL (SUBLIMAZE) injection, haloperidol lactate, hydrALAZINE, lip balm, nitroGLYCERIN, ondansetron **OR** ondansetron (ZOFRAN) IV, sodium chloride flush  Allergies as of 12/31/2019 - Review Complete 12/31/2019  Allergen Reaction Noted  . Other Anaphylaxis, Itching, and Swelling 08/28/2013    Family History  Problem Relation Age of Onset  . Diabetes Other   . Cancer Mother        uterus; deceased   . Heart attack Mother 62  . Hypertension Other   . Heart attack Father 67  . Stroke Sister        12/2012 age 83 y.o    Social History   Socioeconomic History  . Marital status: Single    Spouse name: Not on file  . Number of children: Not on file  . Years of education: Not on file  . Highest education level: Not on file  Occupational History  . Not on file  Tobacco Use  . Smoking status: Former Research scientist (life sciences)  . Smokeless tobacco: Never Used  Vaping Use  . Vaping Use: Never used  Substance and Sexual Activity  . Alcohol use: No  . Drug use: Yes    Types: Cocaine  . Sexual activity: Yes    Birth control/protection: Condom  Other Topics Concern  . 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 Difficulty of Paying Living Expenses: Not on file  Food Insecurity:   . Worried About Charity fundraiser in the Last Year: Not on file  . Ran Out of Food in the Last Year: Not on file  Transportation Needs:   . Lack of  Transportation (Medical): Not on file  . Lack of Transportation (Non-Medical): Not on file  Physical Activity:   . Days of Exercise per Week: Not on file  . Minutes of Exercise per Session: Not on file  Stress:   . Feeling of Stress : Not on file  Social Connections:   . Frequency of Communication with Friends and Family: Not on file  . Frequency of Social Gatherings with Friends and Family: Not on file  . Attends Religious Services: Not on file  . Active Member of Clubs or Organizations: Not on file  . Attends Archivist Meetings: Not on file  . Marital Status: Not on file  Intimate Partner Violence:   . Fear of Current or Ex-Partner: Not on file  . Emotionally Abused: Not on file  . Physically Abused: Not on file  . Sexually Abused: Not on file    Review of Systems: All negative except as stated above in HPI.  Physical Exam: Vital signs: Vitals:   01/18/20 0415 01/18/20 0731  BP: (!) 156/84 (!) 155/90  Pulse: 70 68  Resp: 19 17  Temp: 97.9 F (36.6 C) 98.2 F (36.8 C)  SpO2: 98% 97%   Last BM Date: 01/17/20 General:   Alert,  Well-developed, well-nourished, pleasant and cooperative in NAD Lungs:  Clear throughout to auscultation.   No wheezes, crackles, or rhonchi. No acute distress. Heart:  Regular rate and rhythm; no murmurs, clicks, rubs,  or gallops. Abdomen: Abdomen is significantly distended with hypoactive bowel sounds, positive guarding but no rebound. Lower extremity.  Changes of chronic venous stasis with dry skin's. Neuro.  Alert and oriented x3 Psych.  Somewhat anxious Rectal:  Deferred  GI:  Lab Results: Recent Labs    01/16/20 0236 01/18/20 0517  WBC 4.6 4.4  HGB 9.8* 9.7*  HCT 31.0* 31.4*  PLT 303 290   BMET Recent Labs    01/17/20 1403 01/18/20 0016 01/18/20 0517  NA 140 140 142  K 2.2* 2.4* 2.2*  CL 104 105 108  CO2 25 23 23   GLUCOSE 166* 149* 146*  BUN 27* 27* 27*  CREATININE 1.44* 1.63* 1.68*  CALCIUM 8.6* 8.5* 8.4*    LFT Recent Labs    01/18/20 0517  PROT 5.7*  ALBUMIN 2.2*  AST 21  ALT 16  ALKPHOS 68  BILITOT 1.1   PT/INR No results for input(s): LABPROT, INR in the last 72 hours.   Studies/Results: CT ABDOMEN PELVIS W CONTRAST  Result Date: 01/16/2020 CLINICAL DATA:  Suspect bowel obstruction. Abdominal pain and distension. EXAM: CT ABDOMEN AND PELVIS WITH CONTRAST TECHNIQUE: Multidetector CT imaging of the abdomen and pelvis was performed using the standard protocol following bolus administration of intravenous contrast. Unable to move patient completely into scanner to cover the very superior aspect of the liver due to patient's severe claustrophobia. CONTRAST:  177mL OMNIPAQUE IOHEXOL 300 MG/ML  SOLN COMPARISON:  01/05/2020 FINDINGS: Lower chest: Lung bases are clear.  Mild cardiomegaly. Hepatobiliary: Liver and biliary tree are normal. There is a 2.4 cm stone over the gallbladder. Pancreas: Normal. Spleen: Normal. Adrenals/Urinary Tract: Adrenal glands are normal. Kidneys are normal in size without hydronephrosis or focal mass. No evidence of nephrolithiasis. Ureters are normal. Foley catheter is present within a decompressed bladder. Stomach/Bowel: Nasogastric tube is present with tip over the posterior gastric fundus. Air is present throughout the colon with fluid over the rectosigmoid colon. There are numerous air and fluid-filled dilated small bowel loops measuring up to 4.5 cm in diameter. No evidence of pneumatosis and no evidence of free peritoneal air. Findings suggest a distal early/partial small bowel obstruction and less likely ileus. No definite transition point visualized. Appendix not visualized. Vascular/Lymphatic: Abdominal aorta is normal caliber. No significant adenopathy. Reproductive: Normal. Other: Tiny amount of free peritoneal fluid. Mild to moderate subcutaneous edema over the abdominal wall. Musculoskeletal: Degenerative change of the spine. IMPRESSION: 1. Numerous air and  fluid-filled dilated small bowel loops measuring up to 4.5 cm in diameter with air throughout the colon. No definite transition point visualized. No evidence of pneumatosis or free peritoneal air. Findings suggest a distal early/partial small bowel obstruction and less likely ileus. 2. 2.4  cm gallstone unchanged. 3. Tubes and lines as described. 4. Moderate subcutaneous edema over the abdomen/pelvis. Electronically Signed   By: Marin Olp M.D.   On: 01/16/2020 16:46   DG Abd 2 Views  Result Date: 01/16/2020 CLINICAL DATA:  Worsening abdominal pain and distention. EXAM: ABDOMEN - 2 VIEW COMPARISON:  Multiple study since January 05, 2020 FINDINGS: Diffuse colonic dilatation is identified, similar in the interval. There are now dilated loops of small bowel, particularly in the left side of the abdomen. The small bowel dilatation is apparently new. No free air, portal venous gas, or pneumatosis identified. IMPRESSION: 1. Diffuse colonic dilatation remains. There is now small bowel dilatation as well, which is a new finding, particular in the left side of the abdomen. The findings could represent ileus or distal colonic obstruction. Electronically Signed   By: Dorise Bullion III M.D   On: 01/16/2020 13:56    Impression/Plan: -Generalized abdominal distention with CT scan showing distention of the small bowel as well as the colon.  Most likely generalized ileus versus Ogilvie's syndrome. -Hypokalemia -Bipolar disorder  Recommendations -------------------------- -Recent CT scan reviewed and discussed personally with radiologist.  There is a diffuse dilation of small bowel as well as colon up to the rectum.  Possible underdistention of sigmoid colon.  No  mass lesion or volvulus noted.  -Recommend repeating abdominal x-ray. -Start MiraLAX twice a day -Decrease narcotics.  Ambulate with assistance. -Replace electrolytes.  Recommend potassium more than 4 and magnesium around 2. -Repeat x-ray shows  ongoing abdominal distention, he may benefit from repeat NG tube with intermittent suction. -GI will follow    LOS: 14 days   Otis Brace  MD, FACP 01/18/2020, 10:11 AM  Contact #  [phone]

## 2020-01-19 ENCOUNTER — Encounter (HOSPITAL_COMMUNITY)
Admission: EM | Disposition: A | Discharge status: Skilled Nursing Facility | Payer: Self-pay | Source: Home / Self Care | Attending: Family Medicine

## 2020-01-19 ENCOUNTER — Inpatient Hospital Stay (HOSPITAL_COMMUNITY): Payer: Medicare HMO

## 2020-01-19 ENCOUNTER — Inpatient Hospital Stay (HOSPITAL_COMMUNITY): Payer: Medicare HMO | Admitting: Certified Registered Nurse Anesthetist

## 2020-01-19 ENCOUNTER — Encounter (HOSPITAL_COMMUNITY): Payer: Self-pay | Admitting: Internal Medicine

## 2020-01-19 DIAGNOSIS — E876 Hypokalemia: Secondary | ICD-10-CM | POA: Diagnosis not present

## 2020-01-19 DIAGNOSIS — N179 Acute kidney failure, unspecified: Secondary | ICD-10-CM | POA: Diagnosis not present

## 2020-01-19 DIAGNOSIS — R52 Pain, unspecified: Secondary | ICD-10-CM | POA: Diagnosis not present

## 2020-01-19 DIAGNOSIS — R140 Abdominal distension (gaseous): Secondary | ICD-10-CM | POA: Diagnosis not present

## 2020-01-19 HISTORY — PX: BOWEL DECOMPRESSION: SHX5532

## 2020-01-19 HISTORY — PX: COLONOSCOPY WITH PROPOFOL: SHX5780

## 2020-01-19 LAB — CBC WITH DIFFERENTIAL/PLATELET
Abs Immature Granulocytes: 0.04 10*3/uL (ref 0.00–0.07)
Basophils Absolute: 0 10*3/uL (ref 0.0–0.1)
Basophils Relative: 0 %
Eosinophils Absolute: 0.2 10*3/uL (ref 0.0–0.5)
Eosinophils Relative: 6 %
HCT: 33.5 % — ABNORMAL LOW (ref 39.0–52.0)
Hemoglobin: 10.4 g/dL — ABNORMAL LOW (ref 13.0–17.0)
Immature Granulocytes: 1 %
Lymphocytes Relative: 22 %
Lymphs Abs: 1 10*3/uL (ref 0.7–4.0)
MCH: 25.7 pg — ABNORMAL LOW (ref 26.0–34.0)
MCHC: 31 g/dL (ref 30.0–36.0)
MCV: 82.7 fL (ref 80.0–100.0)
Monocytes Absolute: 0.7 10*3/uL (ref 0.1–1.0)
Monocytes Relative: 16 %
Neutro Abs: 2.4 10*3/uL (ref 1.7–7.7)
Neutrophils Relative %: 55 %
Platelets: 315 10*3/uL (ref 150–400)
RBC: 4.05 MIL/uL — ABNORMAL LOW (ref 4.22–5.81)
RDW: 15 % (ref 11.5–15.5)
WBC: 4.3 10*3/uL (ref 4.0–10.5)
nRBC: 0 % (ref 0.0–0.2)

## 2020-01-19 LAB — RENAL FUNCTION PANEL
Albumin: 2.4 g/dL — ABNORMAL LOW (ref 3.5–5.0)
Anion gap: 14 (ref 5–15)
BUN: 26 mg/dL — ABNORMAL HIGH (ref 6–20)
CO2: 18 mmol/L — ABNORMAL LOW (ref 22–32)
Calcium: 8.6 mg/dL — ABNORMAL LOW (ref 8.9–10.3)
Chloride: 109 mmol/L (ref 98–111)
Creatinine, Ser: 1.52 mg/dL — ABNORMAL HIGH (ref 0.61–1.24)
GFR, Estimated: 53 mL/min — ABNORMAL LOW (ref >60)
Glucose, Bld: 161 mg/dL — ABNORMAL HIGH (ref 70–99)
Phosphorus: 2.8 mg/dL (ref 2.5–4.6)
Potassium: 2.6 mmol/L — CL (ref 3.5–5.1)
Sodium: 141 mmol/L (ref 135–145)

## 2020-01-19 LAB — POCT I-STAT, CHEM 8
BUN: 24 mg/dL — ABNORMAL HIGH (ref 6–20)
Calcium, Ion: 1.17 mmol/L (ref 1.15–1.40)
Chloride: 113 mmol/L — ABNORMAL HIGH (ref 98–111)
Creatinine, Ser: 1.3 mg/dL — ABNORMAL HIGH (ref 0.61–1.24)
Glucose, Bld: 169 mg/dL — ABNORMAL HIGH (ref 70–99)
HCT: 33 % — ABNORMAL LOW (ref 39.0–52.0)
Hemoglobin: 11.2 g/dL — ABNORMAL LOW (ref 13.0–17.0)
Potassium: 2.6 mmol/L — CL (ref 3.5–5.1)
Sodium: 146 mmol/L — ABNORMAL HIGH (ref 135–145)
TCO2: 19 mmol/L — ABNORMAL LOW (ref 22–32)

## 2020-01-19 LAB — GLUCOSE, CAPILLARY
Glucose-Capillary: 150 mg/dL — ABNORMAL HIGH (ref 70–99)
Glucose-Capillary: 156 mg/dL — ABNORMAL HIGH (ref 70–99)
Glucose-Capillary: 152 mg/dL — ABNORMAL HIGH (ref 70–99)
Glucose-Capillary: 185 mg/dL — ABNORMAL HIGH (ref 70–99)

## 2020-01-19 LAB — MAGNESIUM
Magnesium: 2.2 mg/dL (ref 1.7–2.4)
Magnesium: 2.1 mg/dL (ref 1.7–2.4)

## 2020-01-19 SURGERY — COLONOSCOPY WITH PROPOFOL
Anesthesia: Monitor Anesthesia Care

## 2020-01-19 MED ORDER — SODIUM CHLORIDE 0.45 % IV SOLN
INTRAVENOUS | Status: DC
  Filled 2020-01-19 (×2): qty 1000

## 2020-01-19 MED ORDER — LACTATED RINGERS IV SOLN: INTRAVENOUS | Status: DC | PRN

## 2020-01-19 MED ORDER — POTASSIUM CHLORIDE 10 MEQ/100ML IV SOLN
10.0000 meq | INTRAVENOUS | Status: AC
  Administered 2020-01-19 (×4): 10 meq via INTRAVENOUS
  Filled 2020-01-19 (×4): qty 100

## 2020-01-19 MED ORDER — SODIUM CHLORIDE 0.9 % IV SOLN: INTRAVENOUS | Status: DC

## 2020-01-19 MED ORDER — SODIUM CHLORIDE 0.45 % IV SOLN: INTRAVENOUS | Status: DC

## 2020-01-19 MED ORDER — PROPOFOL 500 MG/50ML IV EMUL
INTRAVENOUS | Status: DC | PRN
  Administered 2020-01-19: 120 ug/kg/min via INTRAVENOUS

## 2020-01-19 MED ORDER — TAMSULOSIN HCL 0.4 MG PO CAPS
0.8000 mg | ORAL_CAPSULE | Freq: Every day | ORAL | Status: DC
  Administered 2020-01-20 – 2020-01-31 (×11): 0.8 mg via ORAL
  Filled 2020-01-19 (×11): qty 2

## 2020-01-19 MED ORDER — ENOXAPARIN SODIUM 60 MG/0.6ML ~~LOC~~ SOLN
55.0000 mg | Freq: Every day | SUBCUTANEOUS | Status: DC
  Administered 2020-01-20 – 2020-01-28 (×9): 55 mg via SUBCUTANEOUS
  Filled 2020-01-19 (×10): qty 0.6

## 2020-01-19 MED ORDER — AMLODIPINE BESYLATE 5 MG PO TABS
5.0000 mg | ORAL_TABLET | Freq: Every day | ORAL | Status: DC
  Administered 2020-01-20 – 2020-01-24 (×5): 5 mg via ORAL
  Filled 2020-01-19 (×5): qty 1

## 2020-01-19 SURGICAL SUPPLY — 22 items

## 2020-01-19 NOTE — Progress Notes (Addendum)
2345 Patient reports throat discomfort, no signs and symptoms of distress, no SOB, patient speaking clearly and not struggling to speak or swallow.  NP Ouma made aware, spoke with NP on the phone about possible NG Tube placement patient stated earlier he wasn't opposed to inserting another NG tube. Upon asking patient again about NG tube insertion tonight he denied and stated he wanted to wait until tomorrow morning. Patient having abdominal discomfort, refusing pain medication. 1 small bowel movement noted, mucus consistency. No nausea or vomiting   0430 Patient wants to slide to foot of bed while sitting up, when asking why he states "so he wont lean on anything and get out of the hole he is sitting in" Nurse lowered head of the bed so that the bed was completely flat and he wasn't able to lean on anything. Patient stated he didn't like that because he could not lean on anything. Nurse raise head of the back up. Pain medication administered.

## 2020-01-19 NOTE — Op Note (Signed)
[facility] Patient Name: [name] Procedure Date : 01/19/2020 MRN: [id number] Attending MD: Otis Brace , MD Date of Birth: 1960/07/18 CSN: [id number] Age: 59 Admit Type: Inpatient Procedure:                Colonoscopy Indications:              Therapeutic procedure, Abnormal abdominal x-ray of                            the GI tract, Abnormal CT of the GI tract Providers:                Otis Brace, MD, Benay Pillow, RN, Clyde Lundborg, RN, Tyrone Apple, Technician, Lerry Paterson, CRNA Referring MD:              Medicines:                Sedation Administered by an Anesthesia Professional Complications:            No immediate complications. Estimated Blood Loss:     Estimated blood loss was minimal. Procedure:                Pre-Anesthesia Assessment:                           - Prior to the procedure, a History and Physical                            was performed, and patient medications and                            allergies were reviewed. The patient's tolerance of                            previous anesthesia was also reviewed. The risks                            and benefits of the procedure and the sedation                            options and risks were discussed with the patient.                            All questions were answered, and informed consent                            was obtained. Prior Anticoagulants: The patient has                            taken no previous anticoagulant or antiplatelet  agents. ASA Grade Assessment: III - A patient with                            severe systemic disease. After reviewing the risks                            and benefits, the patient was deemed in                            satisfactory condition to undergo the procedure.                           After obtaining informed consent, the colonoscope                             was passed under direct vision. Throughout the                            procedure, the patient's blood pressure, pulse, and                            oxygen saturations were monitored continuously. The                            PCF-H190DL ([id number] Olympus pediatric colonscope                            was introduced through the anus and advanced to the                            the cecum, identified by appendiceal orifice and                            ileocecal valve. The colonoscopy was performed with                            moderate difficulty due to a redundant colon.                            Successful completion of the procedure was aided by                            applying abdominal pressure. The patient tolerated                            the procedure well. The quality of the bowel                            preparation was inadequate. Scope In: 11:54:28 AM Scope Out: 12:30:48 PM Scope Withdrawal Time: 0 hours 10 minutes 1 second  Total Procedure Duration: 0 hours 36 minutes 20 seconds  Findings:      The perianal and digital rectal examinations were normal.      The lumen of the colon (entire  examined portion) was grossly dilated.       Decompression was attempted and was successful, with complete       decompression achieved.      A large amount of liquid stool was found in the entire colon. Fluid       aspiration was performed through the scope suction channel. The amount       of fluid collected was around 1500 ml.      The retroflexed view of the distal rectum and anal verge was normal and       showed no anal or rectal abnormalities. Impression:               - Preparation of the colon was inadequate.                           - Dilated in the entire examined colon.                           - Stool in the entire examined colon. Fluid                            aspiration performed. Recommendation:           - Return patient to hospital  ward for ongoing care.                           - Clear liquid diet.                           - Continue present medications.                           - Perform a flat plate and upright abdominal x-ray                            today. Procedure Code(s):        --- Professional ---                           [phone], Colonoscopy, flexible; with decompression                            (for pathologic distention) (eg, volvulus,                            megacolon), including placement of decompression                            tube, when performed Diagnosis Code(s):        --- Professional ---                           K59.39, Other megacolon                           R93.3, Abnormal findings on diagnostic imaging of  other parts of digestive tract CPT copyright 2019 [employer]. All rights reserved. The codes documented in this report are preliminary and upon coder review may  be revised to meet current compliance requirements. Otis Brace, MD Otis Brace, MD 01/19/2020 12:43:16 PM Number of Addenda: 0

## 2020-01-19 NOTE — Progress Notes (Signed)
Patient to xray.

## 2020-01-19 NOTE — Transfer of Care (Signed)
Immediate Anesthesia Transfer of Care Note  Patient: [name]  Procedure(s) Performed: COLONOSCOPY WITH PROPOFOL (N/A )  Patient Location: Endoscopy Unit  Anesthesia Type:MAC  Level of Consciousness: drowsy  Airway & Oxygen Therapy: Patient Spontanous Breathing and Patient connected to nasal cannula oxygen  Post-op Assessment: Report given to RN and Post -op Vital signs reviewed and stable  Post vital signs: Reviewed and stable  Last Vitals:  Vitals Value Taken Time  BP 159/85 01/19/20 1236  Temp    Pulse 68 01/19/20 1237  Resp 15 01/19/20 1237  SpO2 100 % 01/19/20 1237  Vitals shown include unvalidated device data.  Last Pain:  Vitals:   01/19/20 1100  TempSrc: Tympanic  PainSc: 5       Patients Stated Pain Goal: 0 (16/10/96 0454)  Complications: No complications documented.

## 2020-01-19 NOTE — Plan of Care (Signed)

## 2020-01-19 NOTE — Progress Notes (Signed)
Pt does not want the gastric tube at this time. Wants to wait until tomorrow. Says he has been through a lot today.

## 2020-01-19 NOTE — Progress Notes (Signed)
[facility] PROGRESS NOTE    Progress Note  [name]  DGL:[id number] DOB: 08-05-1960 DOA: 12/31/2019 PCP: [name], NP     Brief Narrative:   [name] is an 59 y.o. male past medical history significant for essential hypertension, poorly controlled diabetes mellitus, chronic leukopenia CVA history of substance abuse presents secondary to body ache with evidence of mild rhabdomyolysis and fluid overloaded, found to be in nephrotic syndrome and acute kidney injury started on IV diuresis neurology was consulted subsequently neurology signed off the patient developed abdominal distention with ileus and hypokalemia he intermittently refused treatment with potassium supplementation has remove IV lines and NG tube surgery was consulted for ileus and appreciate assistance.  Palliative care has been consulted for goals of care discussion.  Assessment/Plan:   Nephrotic syndrome: Patient was started on a Lasix drip subsequently switched to oral Lasix by nephrology, he diuresed well nephrology signed off on 01/16/2020. As the patient was made n.p.o. he was switched back to IV Lasix.  Lasix was discontinued 01/17/2020 because of severe hyperkalemia. In the chart he is negative about 20 L since admission, his weight has significantly decreased, from 136 kg on admission 211 kg now.  Ileus: With abdominal distention abdominal pain x-ray showed persistent colonic dilation and small bowel dilation. CT scan of the abdomen with contrast suggest early partial small bowel obstruction. NG tube was dropped the patient subsequently removed it.  General surgery was consulted Recommended to continue the patient n.p.o. IV fluids and analgesic. The previous physician spoke to Dr. Trula Slade GI on 01/17/2020 who commended conservative management and try to keep the potassium greater than 2.  But no further GI surgical interventions. Due to patient's times unreasonable answers and expectations I  was consulted and deemed him to have capacity. We will continue to limit narcotics. General surgery did not recommend surgical intervention at this point in time. Continue MiraLAX twice a day ambulate patient try to keep potassium greater than 4 magnesium greater than 2. Repeated x-ray show ongoing abdominal distention. GI recommended a flex sig for decompression on 01/19/2020  Acute kidney injury on chronic kidney disease stage III: In the setting of nephrotic syndrome and ACE inhibitor use. With a baseline creatinine of 1.1 benazepril held on admission Lasix was held he was started on IV fluids, morning is 1.5.  His potassium continues to be significantly low.  Severe hypokalemia: Continue aggressive IV replacement, his Lasix has been held and now on IV fluids. We will discuss with patient to see if he is agreeable to oral potassium.  Hypomagnesemia: IV repletion.  Intermittent agitation: Psych was consulted they deemed that the patient has capacity to make medical decisions. Palliative care was consulted CODE STATUS was changed to DNR appreciate palliative care assistance.  Chronic diastolic heart failure: Stable.  Essential hypertension: Blood pressure is significantly elevated he is getting IV fluids, he is currently on atenolol and diltiazem.  We will add Norvasc 5.  Normocytic anemia: Follow-up with PCP as an outpatient.  Diabetes mellitus type 2: The patient is currently on Lantus 20 units plus sliding scale leukosis fairly controlled.  Bicuspid aortic valve/moderate aortic stenosis: Follow-up with cardiology as an outpatient, noted.  Urinary retention: Foley placed on11/14/2021 will need outpatient urologic evaluation probably due to BPH.    DVT prophylaxis: lovenox Family Communication:none Status is: Inpatient  Remains inpatient appropriate because:Hemodynamically unstable   Dispo: The patient is from: Home  Anticipated d/c is to: Home               Anticipated d/c date is: > 3 days              Patient currently is not medically stable to d/c.        Code Status:     Code Status Orders  (From admission, onward)         Start     Ordered   01/17/20 1708  Do not attempt resuscitation (DNR)  Continuous       Question Answer Comment  In the event of cardiac or respiratory ARREST Do not call a "code blue"   In the event of cardiac or respiratory ARREST Do not perform Intubation, CPR, defibrillation or ACLS   In the event of cardiac or respiratory ARREST Use medication by any route, position, wound care, and other measures to relive pain and suffering. May use oxygen, suction and manual treatment of airway obstruction as needed for comfort.      01/17/20 1708        Code Status History    Date Active Date Inactive Code Status Order ID Comments User Context   12/31/2019 2311 01/17/2020 1708 Full Code [id number]  [name], MD ED   12/31/2017 1739 01/02/2018 1315 Full Code [id number]  [name], [name], MD Inpatient   09/28/2017 1434 10/01/2017 1948 Full Code [id number]  [name], [place], DO ED   07/30/2017 2111 08/04/2017 1746 Full Code [id number]  Rankin, Mercy Moore, NP Inpatient   07/29/2017 2347 07/30/2017 2033 Full Code [id number]  Recardo Evangelist, PA-C ED   01/08/2015 2013 01/09/2015 2017 Full Code [id number]  [name], MD Inpatient   01/04/2015 1915 01/08/2015 2013 Full Code [id number]  [name], MD Inpatient   12/01/2014 1735 12/02/2014 1242 Full Code [id number]  [name], MD ED   08/28/2013 1748 08/31/2013 1627 Full Code [id number]  [name], NP Inpatient   08/28/2013 1301 08/28/2013 1748 Full Code [id number]  [name], MD ED   05/10/2011 2141 05/12/2011 1747 Full Code [id number]  Vertell Limber ED   Advance Care Planning Activity        IV Access:    Peripheral IV   Procedures and diagnostic studies:   DG Abd 2 Views  Result Date: 01/19/2020 CLINICAL DATA:  Abdominal distension  EXAM: ABDOMEN - 2 VIEW COMPARISON:  01/18/2020 FINDINGS: Extensive gaseous distention of large and small bowel loops throughout the abdomen, similar in appearance to prior. No gross free intraperitoneal air. IMPRESSION: Similar extensive gaseous distention of large and small bowel loops throughout the abdomen. Electronically Signed   By: Davina Poke D.O.   On: 01/19/2020 08:14   DG Abd 2 Views  Result Date: 01/18/2020 CLINICAL DATA:  Abdominal pain, distention EXAM: ABDOMEN - 2 VIEW COMPARISON:  None. FINDINGS: Diffuse gaseous distention of bowel compatible with ileus. No real change since prior study. No organomegaly or free air. IMPRESSION: Diffuse gaseous distention of bowel compatible with ileus. No change. Electronically Signed   By: Rolm Baptise M.D.   On: 01/18/2020 11:23     Medical Consultants:    None.  Anti-Infectives:   none  Subjective:    [name] relates he is having abdominal pain.  Objective:    Vitals:   01/18/20 1919 01/19/20 0445 01/19/20 0500 01/19/20 0700  BP: (!) 147/96 (!) 148/89  (!) 168/98  Pulse: 72 68  73  Resp: 18  18  17  Temp: 97.8 F (36.6 C) 98.3 F (36.8 C)  97.8 F (36.6 C)  TempSrc: Oral Oral  Oral  SpO2: 100% 99%  100%  Weight:   111.6 kg   Height:       SpO2: 100 %   Intake/Output Summary (Last 24 hours) at 01/19/2020 0924 Last data filed at 01/19/2020 0500 Gross per 24 hour  Intake 745 ml  Output 1325 ml  Net -580 ml   Filed Weights   01/17/20 0557 01/18/20 0604 01/19/20 0500  Weight: 115.1 kg 111.4 kg 111.6 kg    Exam: General exam: In no acute distress. Respiratory system: Good air movement and clear to auscultation. Cardiovascular system: S1 & S2 heard, RRR. No JVD, murmurs, rubs, gallops or clicks.  Gastrointestinal system: Abdomen is nondistended, soft and nontender.  Central nervous system: Alert and oriented. No focal neurological deficits. Extremities: No pedal edema. Skin: No rashes, lesions or  ulcers Psychiatry: Judgement and insight appear normal. Mood & affect appropriate.    Data Reviewed:    Labs: Basic Metabolic Panel: Recent Labs  Lab 01/14/20 0507 01/14/20 0507 01/15/20 0215 01/15/20 0215 01/16/20 0236 01/16/20 2206 01/17/20 0235 01/17/20 0235 01/17/20 1403 01/17/20 1403 01/18/20 0016 01/18/20 0016 01/18/20 0517 01/18/20 0517 01/18/20 1254 01/18/20 1254 01/18/20 2030 01/19/20 0205  NA 137   < > 138   < > 139   < > 140  --  140  --  140  --  142  --   --   --   --  141  K 3.0*   < > 2.7*   < > 2.4*   < > 2.4*   < > 2.2*   < > 2.4*   < > 2.2*   < > 2.5*   < > 2.8* 2.6*  CL 104   < > 102   < > 103   < > 99  --  104  --  105  --  108  --   --   --   --  109  CO2 24   < > 25   < > 28   < > 23  --  25  --  23  --  23  --   --   --   --  18*  GLUCOSE 130*   < > 135*   < > 126*   < > 186*  --  166*  --  149*  --  146*  --   --   --   --  161*  BUN 42*   < > 39*   < > 29*   < > 28*  --  27*  --  27*  --  27*  --   --   --   --  26*  CREATININE 1.73*   < > 1.43*   < > 1.26*   < > 1.27*  --  1.44*  --  1.63*  --  1.68*  --   --   --   --  1.52*  CALCIUM 9.0   < > 8.9   < > 9.0   < > 8.8*  --  8.6*  --  8.5*  --  8.4*  --   --   --   --  8.6*  MG 2.1  --   --   --   --   --   --   --   --   --   --   --  1.6*  --  2.1  --   --  2.2  PHOS 5.0*   < > 3.9  --  3.5  --  4.8*  --   --   --   --   --  3.6  --   --   --   --  2.8   < > = values in this interval not displayed.   GFR Estimated Creatinine Clearance: 64.2 mL/min (A) (by C-G formula based on SCr of 1.52 mg/dL (H)). Liver Function Tests: Recent Labs  Lab 01/15/20 0215 01/16/20 0236 01/17/20 0235 01/18/20 0517 01/19/20 0205  AST  --   --   --  21  --   ALT  --   --   --  16  --   ALKPHOS  --   --   --  68  --   BILITOT  --   --   --  1.1  --   PROT  --   --   --  5.7*  --   ALBUMIN 2.2* 2.1* 2.1* 2.2* 2.4*   No results for input(s): LIPASE, AMYLASE in the last 168 hours. Recent Labs  Lab  01/18/20 0517  AMMONIA 39*   Coagulation profile No results for input(s): INR, PROTIME in the last 168 hours. COVID-19 Labs  No results for input(s): DDIMER, FERRITIN, LDH, CRP in the last 72 hours.  Lab Results  Component Value Date   SARSCOV2NAA NEGATIVE 01/03/2020   [place] NEGATIVE 12/31/2019   [place] NEGATIVE 12/12/2019    CBC: Recent Labs  Lab 01/13/20 0706 01/14/20 0507 01/16/20 0236 01/18/20 0517 01/19/20 0205  WBC 3.5* 4.4 4.6 4.4 4.3  NEUTROABS 1.9 2.3 2.5 2.3 2.4  HGB 10.2* 9.9* 9.8* 9.7* 10.4*  HCT 33.4* 31.7* 31.0* 31.4* 33.5*  MCV 81.1 80.7 81.6 82.0 82.7  PLT 310 280 303 290 315   Cardiac Enzymes: No results for input(s): CKTOTAL, CKMB, CKMBINDEX, TROPONINI in the last 168 hours. BNP (last 3 results) No results for input(s): PROBNP in the last 8760 hours. CBG: Recent Labs  Lab 01/18/20 1143 01/18/20 1654 01/18/20 2027 01/19/20 0700 01/19/20 0832  GLUCAP 152* 139* 148* 150* 156*   D-Dimer: No results for input(s): DDIMER in the last 72 hours. Hgb A1c: No results for input(s): HGBA1C in the last 72 hours. Lipid Profile: No results for input(s): CHOL, HDL, LDLCALC, TRIG, CHOLHDL, LDLDIRECT in the last 72 hours. Thyroid function studies: Recent Labs    01/18/20 0517  TSH 2.317   Anemia work up: Recent Labs    01/18/20 0517  VITAMINB12 545  FOLATE 9.4   Sepsis Labs: Recent Labs  Lab 01/14/20 0507 01/16/20 0236 01/18/20 0517 01/19/20 0205  WBC 4.4 4.6 4.4 4.3   Microbiology Recent Results (from the past 240 hour(s))  Culture, Urine     Status: None   Collection Time: 01/09/20  3:45 PM   Specimen: Urine, Catheterized  Result Value Ref Range Status   Specimen Description URINE, CATHETERIZED  Final   Special Requests NONE  Final   Culture   Final    NO GROWTH Performed at [facility], [address] [place], [place] [address]    Report Status 01/10/2020 FINAL  Final  Culture, Urine     Status: None    Collection Time: 01/16/20  4:49 PM   Specimen: Urine, Random  Result Value Ref Range Status   Specimen Description URINE, RANDOM  Final   Special Requests NONE  Final  Culture   Final    NO GROWTH Performed at [facility], [place] [address], [place], [place] [address]    Report Status 01/17/2020 FINAL  Final     Medications:   . atenolol  25 mg Oral Daily  . Chlorhexidine Gluconate Cloth  6 each Topical Daily  . diltiazem  180 mg Oral Daily  . [START ON 01/20/2020] enoxaparin (LOVENOX) injection  55 mg Subcutaneous Daily  . insulin aspart  0-5 Units Subcutaneous QHS  . insulin aspart  0-9 Units Subcutaneous TID WC  . insulin glargine  10 Units Subcutaneous Daily  . pantoprazole (PROTONIX) IV  40 mg Intravenous Q24H  . polyethylene glycol  17 g Oral BID  . sodium chloride flush  3 mL Intravenous Q12H  . sodium chloride flush  3 mL Intravenous Q12H  . tamsulosin  0.4 mg Oral Daily   Continuous Infusions: . sodium chloride    . 0.9 % NaCl with KCl 40 mEq / L 75 mL/hr at 01/19/20 1505      LOS: 15 days   Charlynne Cousins  [facility]  01/19/2020, 9:24 AM

## 2020-01-19 NOTE — Addendum Note (Signed)
Addendum  created 01/19/20 1554 by Freddrick March, MD   Clinical Note Signed

## 2020-01-19 NOTE — Anesthesia Preprocedure Evaluation (Addendum)
Anesthesia Evaluation  Patient identified by MRN, date of birth, ID band Patient awake    Reviewed: Allergy & Precautions, NPO status , Patient's Chart, lab work & pertinent test results  Airway Mallampati: II  TM Distance: >3 FB Neck ROM: Full    Dental  (+) Edentulous Upper, Poor Dentition, Missing, Dental Advisory Given,    Pulmonary neg pulmonary ROS, former smoker,    Pulmonary exam normal breath sounds clear to auscultation       Cardiovascular hypertension, + CAD and +CHF  Normal cardiovascular exam Rhythm:Regular Rate:Normal  TTE 2021 1. Left ventricular ejection fraction, by estimation, is 65 to 70%. The  left ventricle has normal function. The left ventricle has no regional  wall motion abnormalities. There is moderate left ventricular hypertrophy.  Hyperdense myocardium, consider  hypertensive cardiomyopathy versus infilrative cardiomyopathy. Left  ventricular diastolic parameters are consistent with Grade I diastolic  dysfunction (impaired relaxation).  2. Right ventricular systolic function is normal. The right ventricular  size is normal. There is mildly elevated pulmonary artery systolic  pressure. The estimated right ventricular systolic pressure is 42.8 mmHg.  3. Left atrial size was mildly dilated.  4. A small pericardial effusion is present. The pericardial effusion is  circumferential.  5. The mitral valve is grossly normal. Mild mitral valve regurgitation.  6. The aortic valve is functionally bicuspid. There is moderate  calcification of the aortic valve. Aortic valve regurgitation is trivial.  Moderate aortic valve stenosis. Aortic valve mean gradient measures 13.0  mmHg. Aortic valve Vmax measures 2.37 m/s.  Dimentionless index 0.32.  7. The inferior vena cava is dilated in size with <50% respiratory  variability, suggesting right atrial pressure of 15 mmHg.    Neuro/Psych PSYCHIATRIC DISORDERS  Depression Bipolar Disorder negative neurological ROS     GI/Hepatic negative GI ROS, (+)     substance abuse  cocaine use,   Endo/Other  negative endocrine ROSdiabetes, Insulin Dependent  Renal/GU Renal InsufficiencyRenal disease (Cr 1.3)  negative genitourinary   Musculoskeletal  (+) Arthritis ,   Abdominal   Peds  Hematology negative hematology ROS (+)   Anesthesia Other Findings K 2.6, chronically low, actively repleting  Reproductive/Obstetrics                            Anesthesia Physical Anesthesia Plan  ASA: III  Anesthesia Plan: MAC   Post-op Pain Management:    Induction: Intravenous  PONV Risk Score and Plan: Propofol infusion and Treatment may vary due to age or medical condition  Airway Management Planned: Natural Airway  Additional Equipment:   Intra-op Plan:   Post-operative Plan:   Informed Consent: I have reviewed the patients History and Physical, chart, labs and discussed the procedure including the risks, benefits and alternatives for the proposed anesthesia with the patient or authorized representative who has indicated his/her understanding and acceptance.     Dental advisory given  Plan Discussed with: CRNA  Anesthesia Plan Comments:        Anesthesia Quick Evaluation

## 2020-01-19 NOTE — Plan of Care (Signed)
  Problem: Education: Goal: Knowledge of General Education information will improve Description: Including pain rating scale, medication(s)/side effects and non-pharmacologic comfort measures Outcome: Progressing   Problem: Health Behavior/Discharge Planning: Goal: Ability to manage health-related needs will improve Outcome: Progressing   Problem: Activity: Goal: Risk for activity intolerance will decrease Outcome: Progressing   Problem: Elimination: Goal: Will not experience complications related to bowel motility Outcome: Progressing   Problem: Safety: Goal: Ability to remain free from injury will improve Outcome: Progressing   Problem: Skin Integrity: Goal: Risk for impaired skin integrity will decrease Outcome: Progressing   

## 2020-01-19 NOTE — Anesthesia Postprocedure Evaluation (Signed)
Anesthesia Post Note  Patient: HAYVEN FATIMA  Procedure(s) Performed: COLONOSCOPY WITH PROPOFOL (N/A )     Patient location during evaluation: Endoscopy Anesthesia Type: MAC Level of consciousness: awake and alert Pain management: pain level controlled Vital Signs Assessment: post-procedure vital signs reviewed and stable Respiratory status: spontaneous breathing, nonlabored ventilation, respiratory function stable and patient connected to nasal cannula oxygen Cardiovascular status: blood pressure returned to baseline and stable Postop Assessment: no apparent nausea or vomiting Anesthetic complications: no   No complications documented.  Last Vitals:  Vitals:   01/19/20 1245 01/19/20 1255  BP: (!) 189/96 (!) 176/95  Pulse: 73 76  Resp: 16 19  Temp:    SpO2: 100% 100%    Last Pain:  Vitals:   01/19/20 1255  TempSrc:   PainSc: 0-No pain                 Chaley Castellanos L Meghan Tiemann

## 2020-01-19 NOTE — Progress Notes (Signed)
[facility] Progress Note  [name] 59 y.o. October 02, 1960  CC: Abdominal distention, generalized ileus   Subjective: Patient seen and examined at bedside.  He is sitting on side of the bed.  Had multiple bowel movements today.  Abdomen remains distended.  ROS : Positive for nausea.  Negative for fever.   Objective: Vital signs in last 24 hours: Vitals:   01/19/20 0445 01/19/20 0700  BP: (!) 148/89 (!) 168/98  Pulse: 68 73  Resp: 18 17  Temp: 98.3 F (36.8 C) 97.8 F (36.6 C)  SpO2: 99% 100%    Physical Exam:  General:  Alert, cooperative, no distress, appears stated age  Head:  Normocephalic, without obvious abnormality, atraumatic  Eyes:  , EOM's intact,   Lungs:   Clear to auscultation bilaterally, respirations unlabored  Heart:  Regular rate and rhythm, S1, S2 normal  Abdomen:    Abdomen is very distended and tight, very hypoactive bowel sounds, no rebound.  Extremities: Extremities normal, atraumatic, no  edema  Pulses: 2+ and symmetric    Lab Results: Recent Labs    01/18/20 0517 01/18/20 0517 01/18/20 1254 01/18/20 1254 01/18/20 2030 01/19/20 0205  NA 142  --   --   --   --  141  K 2.2*   < > 2.5*   < > 2.8* 2.6*  CL 108  --   --   --   --  109  CO2 23  --   --   --   --  18*  GLUCOSE 146*  --   --   --   --  161*  BUN 27*  --   --   --   --  26*  CREATININE 1.68*  --   --   --   --  1.52*  CALCIUM 8.4*  --   --   --   --  8.6*  MG 1.6*   < > 2.1  --   --  2.2  PHOS 3.6  --   --   --   --  2.8   < > = values in this interval not displayed.   Recent Labs    01/18/20 0517 01/19/20 0205  AST 21  --   ALT 16  --   ALKPHOS 68  --   BILITOT 1.1  --   PROT 5.7*  --   ALBUMIN 2.2* 2.4*   Recent Labs    01/18/20 0517 01/19/20 0205  WBC 4.4 4.3  NEUTROABS 2.3 2.4  HGB 9.7* 10.4*  HCT 31.4* 33.5*  MCV 82.0 82.7  PLT 290 315   No results for input(s): LABPROT, INR in the last 72 hours.    Assessment/Plan: -Generalized  abdominal distention with CT scan showing distention of the small bowel as well as the colon.  Most likely generalized ileus versus Ogilvie's syndrome. -Hypokalemia -Bipolar disorder  Recommendations -------------------------- -Repeat abdominal x-ray shows no improvement despite of patient having multiple bowel movements. -Plan for colonoscopy for decompression today. -Continue other supportive care -Decrease narcotics.  Ambulate with assistance. -Replace electrolytes.  Recommend potassium more than 4 and magnesium around 2. -GI will follow  Risks (bleeding, infection, bowel perforation that could require surgery, sedation-related changes in cardiopulmonary systems), benefits (identification and possible treatment of source of symptoms, exclusion of certain causes of symptoms), and alternatives (watchful waiting, radiographic imaging studies, empiric medical treatment)  were explained to patient/family in detail and patient wishes to proceed.  Otis Brace MD, FACP 01/19/2020,  8:58 AM  Contact #  [phone]

## 2020-01-20 DIAGNOSIS — R52 Pain, unspecified: Secondary | ICD-10-CM | POA: Diagnosis not present

## 2020-01-20 DIAGNOSIS — N179 Acute kidney failure, unspecified: Secondary | ICD-10-CM | POA: Diagnosis not present

## 2020-01-20 DIAGNOSIS — E876 Hypokalemia: Secondary | ICD-10-CM | POA: Diagnosis not present

## 2020-01-20 DIAGNOSIS — R140 Abdominal distension (gaseous): Secondary | ICD-10-CM | POA: Diagnosis not present

## 2020-01-20 LAB — GLUCOSE, CAPILLARY
Glucose-Capillary: 121 mg/dL — ABNORMAL HIGH (ref 70–99)
Glucose-Capillary: 131 mg/dL — ABNORMAL HIGH (ref 70–99)
Glucose-Capillary: 133 mg/dL — ABNORMAL HIGH (ref 70–99)
Glucose-Capillary: 139 mg/dL — ABNORMAL HIGH (ref 70–99)
Glucose-Capillary: 144 mg/dL — ABNORMAL HIGH (ref 70–99)
Glucose-Capillary: 148 mg/dL — ABNORMAL HIGH (ref 70–99)
Glucose-Capillary: 173 mg/dL — ABNORMAL HIGH (ref 70–99)

## 2020-01-20 LAB — RENAL FUNCTION PANEL
Albumin: 2.2 g/dL — ABNORMAL LOW (ref 3.5–5.0)
Anion gap: 12 (ref 5–15)
BUN: 19 mg/dL (ref 6–20)
CO2: 19 mmol/L — ABNORMAL LOW (ref 22–32)
Calcium: 8.5 mg/dL — ABNORMAL LOW (ref 8.9–10.3)
Chloride: 114 mmol/L — ABNORMAL HIGH (ref 98–111)
Creatinine, Ser: 1.17 mg/dL (ref 0.61–1.24)
GFR, Estimated: >60 mL/min (ref >60)
Glucose, Bld: 190 mg/dL — ABNORMAL HIGH (ref 70–99)
Phosphorus: 2.4 mg/dL — ABNORMAL LOW (ref 2.5–4.6)
Potassium: 2.5 mmol/L — CL (ref 3.5–5.1)
Sodium: 145 mmol/L (ref 135–145)

## 2020-01-20 MED ORDER — POTASSIUM CHLORIDE 20 MEQ PO PACK
40.0000 meq | PACK | Freq: Two times a day (BID) | ORAL | Status: DC
  Filled 2020-01-20: qty 2

## 2020-01-20 MED ORDER — POTASSIUM CHLORIDE 20 MEQ PO PACK
60.0000 meq | PACK | Freq: Three times a day (TID) | ORAL | Status: AC
  Administered 2020-01-20 (×2): 60 meq via ORAL
  Filled 2020-01-20 (×2): qty 3

## 2020-01-20 MED ORDER — POTASSIUM CHLORIDE 10 MEQ/100ML IV SOLN
10.0000 meq | INTRAVENOUS | Status: AC
  Administered 2020-01-20 (×5): 10 meq via INTRAVENOUS
  Filled 2020-01-20 (×5): qty 100

## 2020-01-20 MED ORDER — POTASSIUM CHLORIDE CRYS ER 20 MEQ PO TBCR
40.0000 meq | EXTENDED_RELEASE_TABLET | Freq: Once | ORAL | Status: AC
  Administered 2020-01-20: 40 meq via ORAL
  Filled 2020-01-20: qty 2

## 2020-01-20 NOTE — Plan of Care (Signed)

## 2020-01-20 NOTE — Progress Notes (Signed)
[facility] PROGRESS NOTE    Progress Note  [name]  JAS:[id number] DOB: 10-28-1960 DOA: 12/31/2019 PCP: [name], NP     Brief Narrative:   [name] is an 59 y.o. male past medical history significant for essential hypertension, poorly controlled diabetes mellitus, chronic leukopenia CVA history of substance abuse presents secondary to body ache with evidence of mild rhabdomyolysis and fluid overloaded, found to be in nephrotic syndrome and acute kidney injury started on IV diuresis neurology was consulted subsequently neurology signed off the patient developed abdominal distention with ileus and hypokalemia he intermittently refused treatment with potassium supplementation has remove IV lines and NG tube surgery was consulted for ileus and appreciate assistance.  Palliative care has been consulted for goals of care discussion.  Assessment/Plan:   Nephrotic syndrome: Patient was started on a Lasix drip subsequently switched to oral Lasix by nephrology, he diuresed well nephrology signed off on 01/16/2020. As the patient was made n.p.o. he was switched back to IV Lasix.  Lasix was discontinued 01/17/2020 because of severe hyperkalemia. In the chart he is negative about 20 L since admission, his weight has significantly decreased, from 136 kg on admission 211 kg now.  Ileus: With abdominal distention abdominal pain x-ray showed persistent colonic dilation and small bowel dilation. CT scan of the abdomen with contrast suggest early partial small bowel obstruction. Patient refused surgery NG tube was discontinued as per his request GI was consulted to perform a flex sig with decompression of his colon. Consult physical therapy for ambulation. Repeated abdominal x-ray showed improved variation no free air. Try to keep his potassium greater than 4 he is willing to take oral potassium further management per GI.  Acute kidney injury on chronic kidney disease stage  III: In the setting of nephrotic syndrome and ACE inhibitor use. With a baseline creatinine of 1.1 benazepril held on admission Lasix was held, we have KVO IV fluids, his creatinine has returned to baseline. His potassium continues to be significantly low he is agreed to take oral potassium.  Severe hypokalemia: Persistently low we will give an IV, creatinine has returned to normal he has agreed to take oral potassium replete orally recheck in the morning.  Hypomagnesemia: Now greater than 2 continue to monitor intermittently.  Intermittent agitation: Psych was consulted they deemed that the patient has capacity to make medical decisions. Palliative care was consulted CODE STATUS was changed to DNR appreciate palliative care assistance.  Chronic diastolic heart failure: Stable.  Essential hypertension: Blood pressure continues to be slightly elevated IV fluids have been KVO continue atenolol diltiazem and Norvasc. Once potassium is improved can restart him back on his Lasix.  Normocytic anemia: Follow-up with PCP as an outpatient.  Diabetes mellitus type 2: The patient is currently on Lantus 20 units plus sliding scale leukosis fairly controlled.  Bicuspid aortic valve/moderate aortic stenosis: Follow-up with cardiology as an outpatient, noted.  Urinary retention: Foley placed on11/14/2021 will need outpatient urologic evaluation probably due to BPH.    DVT prophylaxis: lovenox Family Communication:none Status is: Inpatient  Remains inpatient appropriate because:Hemodynamically unstable   Dispo: The patient is from: Home              Anticipated d/c is to: Home              Anticipated d/c date is: > 3 days              Patient currently is not medically stable to d/c.  Code Status:     Code Status Orders  (From admission, onward)         Start     Ordered   01/17/20 1708  Do not attempt resuscitation (DNR)  Continuous       Question Answer  Comment  In the event of cardiac or respiratory ARREST Do not call a "code blue"   In the event of cardiac or respiratory ARREST Do not perform Intubation, CPR, defibrillation or ACLS   In the event of cardiac or respiratory ARREST Use medication by any route, position, wound care, and other measures to relive pain and suffering. May use oxygen, suction and manual treatment of airway obstruction as needed for comfort.      01/17/20 1708        Code Status History    Date Active Date Inactive Code Status Order ID Comments User Context   12/31/2019 2311 01/17/2020 1708 Full Code [id number]  [name], MD ED   12/31/2017 1739 01/02/2018 1315 Full Code [id number]  [name], Roy Huguenin, MD Inpatient   09/28/2017 1434 10/01/2017 1948 Full Code [id number]  [name], McPherson, DO ED   07/30/2017 2111 08/04/2017 1746 Full Code [id number]  Rankin, Mercy Moore, NP Inpatient   07/29/2017 2347 07/30/2017 2033 Full Code [id number]  Recardo Evangelist, PA-C ED   01/08/2015 2013 01/09/2015 2017 Full Code [id number]  [name], MD Inpatient   01/04/2015 1915 01/08/2015 2013 Full Code [id number]  [name], MD Inpatient   12/01/2014 1735 12/02/2014 1242 Full Code [id number]  [name], MD ED   08/28/2013 1748 08/31/2013 1627 Full Code [id number]  [name], NP Inpatient   08/28/2013 1301 08/28/2013 1748 Full Code [id number]  [name], MD ED   05/10/2011 2141 05/12/2011 1747 Full Code [id number]  Vertell Limber ED   Advance Care Planning Activity        IV Access:    Peripheral IV   Procedures and diagnostic studies:   DG Abd 2 Views  Result Date: 01/19/2020 CLINICAL DATA:  Abdominal distension and pain EXAM: ABDOMEN - 2 VIEW COMPARISON:  January 19, 2020 study obtained earlier in the day FINDINGS: Supine and upright images were obtained. There remain multiple loops of dilated small bowel without appreciable air-fluid levels. No appreciable free air. Visualized lung bases clear. No abnormal  calcifications. IMPRESSION: Persistent dilatation of multiple small bowel loops. No free air. Appearance consistent with ileus, although a degree of small bowel obstruction cannot be excluded. Electronically Signed   By: Lowella Grip III M.D.   On: 01/19/2020 13:26   DG Abd 2 Views  Result Date: 01/19/2020 CLINICAL DATA:  Abdominal distension EXAM: ABDOMEN - 2 VIEW COMPARISON:  01/18/2020 FINDINGS: Extensive gaseous distention of large and small bowel loops throughout the abdomen, similar in appearance to prior. No gross free intraperitoneal air. IMPRESSION: Similar extensive gaseous distention of large and small bowel loops throughout the abdomen. Electronically Signed   By: Davina Poke D.O.   On: 01/19/2020 08:14   DG Abd 2 Views  Result Date: 01/18/2020 CLINICAL DATA:  Abdominal pain, distention EXAM: ABDOMEN - 2 VIEW COMPARISON:  None. FINDINGS: Diffuse gaseous distention of bowel compatible with ileus. No real change since prior study. No organomegaly or free air. IMPRESSION: Diffuse gaseous distention of bowel compatible with ileus. No change. Electronically Signed   By: Rolm Baptise M.D.   On: 01/18/2020 11:23     Medical Consultants:    None.  Anti-Infectives:  none  Subjective:    [name] relates his abdominal pain is slightly better.  Objective:    Vitals:   01/19/20 2045 01/20/20 0500 01/20/20 0542 01/20/20 0736  BP: (!) 177/88  (!) 160/76 (!) 169/90  Pulse: 74  69 70  Resp: 16  18 17   Temp: 98.4 F (36.9 C)  98 F (36.7 C) 98 F (36.7 C)  TempSrc: Oral  Oral Oral  SpO2: 100%  97% 98%  Weight:  115.9 kg    Height:       SpO2: 98 %   Intake/Output Summary (Last 24 hours) at 01/20/2020 0857 Last data filed at 01/20/2020 0300 Gross per 24 hour  Intake 969.76 ml  Output --  Net 969.76 ml   Filed Weights   01/19/20 0500 01/19/20 1100 01/20/20 0500  Weight: 111.6 kg 108.9 kg 115.9 kg    Exam: General exam: In no acute  distress. Respiratory system: Good air movement and clear to auscultation. Cardiovascular system: S1 & S2 heard, RRR. No JVD. Gastrointestinal system: Abdomen is nondistended, soft and nontender.  Extremities: No pedal edema. Skin: No rashes, lesions or ulcers  Data Reviewed:    Labs: Basic Metabolic Panel: Recent Labs  Lab 01/14/20 0507 01/15/20 0215 01/16/20 0236 01/16/20 2206 01/17/20 0235 01/17/20 0235 01/17/20 1403 01/17/20 1403 01/18/20 0016 01/18/20 0016 01/18/20 0517 01/18/20 0517 01/18/20 1254 01/18/20 2030 01/19/20 0205 01/19/20 0205 01/19/20 1126 01/19/20 2050 01/20/20 0121  NA 137   < > 139   < > 140   < > 140   < > 140  --  142  --   --   --  141  --  146*  --  145  K 3.0*   < > 2.4*   < > 2.4*   < > 2.2*   < > 2.4*   < > 2.2*   < > 2.5*   < > 2.6*   < > 2.6*  --  2.5*  CL 104   < > 103   < > 99   < > 104   < > 105  --  108  --   --   --  109  --  113*  --  114*  CO2 24   < > 28   < > 23   < > 25  --  23  --  23  --   --   --  18*  --   --   --  19*  GLUCOSE 130*   < > 126*   < > 186*   < > 166*   < > 149*  --  146*  --   --   --  161*  --  169*  --  190*  BUN 42*   < > 29*   < > 28*   < > 27*   < > 27*  --  27*  --   --   --  26*  --  24*  --  19  CREATININE 1.73*   < > 1.26*   < > 1.27*   < > 1.44*   < > 1.63*  --  1.68*  --   --   --  1.52*  --  1.30*  --  1.17  CALCIUM 9.0   < > 9.0   < > 8.8*   < > 8.6*  --  8.5*  --  8.4*  --   --   --  8.6*  --   --   --  8.5*  MG 2.1  --   --   --   --   --   --   --   --   --  1.6*  --  2.1  --  2.2  --   --  2.1  --   PHOS 5.0*   < > 3.5  --  4.8*  --   --   --   --   --  3.6  --   --   --  2.8  --   --   --  2.4*   < > = values in this interval not displayed.   GFR Estimated Creatinine Clearance: 83.7 mL/min (by C-G formula based on SCr of 1.17 mg/dL). Liver Function Tests: Recent Labs  Lab 01/16/20 0236 01/17/20 0235 01/18/20 0517 01/19/20 0205 01/20/20 0121  AST  --   --  21  --   --   ALT  --   --  16   --   --   ALKPHOS  --   --  68  --   --   BILITOT  --   --  1.1  --   --   PROT  --   --  5.7*  --   --   ALBUMIN 2.1* 2.1* 2.2* 2.4* 2.2*   No results for input(s): LIPASE, AMYLASE in the last 168 hours. Recent Labs  Lab 01/18/20 0517  AMMONIA 39*   Coagulation profile No results for input(s): INR, PROTIME in the last 168 hours. COVID-19 Labs  No results for input(s): DDIMER, FERRITIN, LDH, CRP in the last 72 hours.  Lab Results  Component Value Date   SARSCOV2NAA NEGATIVE 01/03/2020   [place] NEGATIVE 12/31/2019   [place] NEGATIVE 12/12/2019    CBC: Recent Labs  Lab 01/14/20 0507 01/16/20 0236 01/18/20 0517 01/19/20 0205 01/19/20 1126  WBC 4.4 4.6 4.4 4.3  --   NEUTROABS 2.3 2.5 2.3 2.4  --   HGB 9.9* 9.8* 9.7* 10.4* 11.2*  HCT 31.7* 31.0* 31.4* 33.5* 33.0*  MCV 80.7 81.6 82.0 82.7  --   PLT 280 303 290 315  --    Cardiac Enzymes: No results for input(s): CKTOTAL, CKMB, CKMBINDEX, TROPONINI in the last 168 hours. BNP (last 3 results) No results for input(s): PROBNP in the last 8760 hours. CBG: Recent Labs  Lab 01/19/20 1643 01/19/20 2053 01/20/20 0014 01/20/20 0538 01/20/20 0735  GLUCAP 152* 185* 173* 148* 139*   D-Dimer: No results for input(s): DDIMER in the last 72 hours. Hgb A1c: No results for input(s): HGBA1C in the last 72 hours. Lipid Profile: No results for input(s): CHOL, HDL, LDLCALC, TRIG, CHOLHDL, LDLDIRECT in the last 72 hours. Thyroid function studies: Recent Labs    01/18/20 0517  TSH 2.317   Anemia work up: Recent Labs    01/18/20 0517  VITAMINB12 545  FOLATE 9.4   Sepsis Labs: Recent Labs  Lab 01/14/20 0507 01/16/20 0236 01/18/20 0517 01/19/20 0205  WBC 4.4 4.6 4.4 4.3   Microbiology Recent Results (from the past 240 hour(s))  Culture, Urine     Status: None   Collection Time: 01/16/20  4:49 PM   Specimen: Urine, Random  Result Value Ref Range Status   Specimen Description URINE, RANDOM  Final    Special Requests NONE  Final   Culture   Final    NO GROWTH Performed at [facility], [address], [place], [place] [address]    Report Status 01/17/2020 FINAL  Final     Medications:   . amLODipine  5 mg Oral Daily  . atenolol  25 mg Oral Daily  . Chlorhexidine Gluconate Cloth  6 each Topical Daily  . diltiazem  180 mg Oral Daily  . enoxaparin (LOVENOX) injection  55 mg Subcutaneous Daily  . insulin aspart  0-5 Units Subcutaneous QHS  . insulin aspart  0-9 Units Subcutaneous TID WC  . insulin glargine  10 Units Subcutaneous Daily  . pantoprazole (PROTONIX) IV  40 mg Intravenous Q24H  . polyethylene glycol  17 g Oral BID  . sodium chloride flush  3 mL Intravenous Q12H  . sodium chloride flush  3 mL Intravenous Q12H  . tamsulosin  0.8 mg Oral Daily   Continuous Infusions: . sodium chloride    . sodium chloride 0.45 % with kcl 50 mL/hr at 01/20/20 0802      LOS: 16 days   Charlynne Cousins  [facility]  01/20/2020, 8:57 AM

## 2020-01-20 NOTE — Progress Notes (Addendum)
11/24  RN notified by Posey Pronto from lab at time 2119 of a critical value (Potassium 2.43mmol/L) from 11/24 1126 blood work.  11/25 0223 Lab notified RN of Pt's Potassium of 2.5. X. Blount,NP has been notified. Pt is not showing signs of hypokalemia.  11/25 0330 New orders for oral potassium chloride has been given.

## 2020-01-20 NOTE — Progress Notes (Signed)
[facility] Progress Note  [name] 59 y.o. January 21, 1961  CC: Abdominal distention, generalized ileus   Subjective: Patient seen and examined at bedside.  He is feeling better today.  Abdomen is less distended.  Tolerating clear liquid diet without any vomiting.  No bowel movement this morning.   ROS : Positive for nausea.  Negative for fever.   Objective: Vital signs in last 24 hours: Vitals:   01/20/20 0542 01/20/20 0736  BP: (!) 160/76 (!) 169/90  Pulse: 69 70  Resp: 18 17  Temp: 98 F (36.7 C) 98 F (36.7 C)  SpO2: 97% 98%    Physical Exam:  General:  Alert, cooperative, no distress, appears stated age  Head:  Normocephalic, without obvious abnormality, atraumatic  Eyes:  , EOM's intact,   Lungs:   Clear to auscultation bilaterally, respirations unlabored  Heart:  Regular rate and rhythm, S1, S2 normal  Abdomen:    Abdomen is moderately distended, better compared to yesterday,hypoactive bowel sounds, no rebound.  Extremities: Extremities normal, atraumatic, no  edema  Pulses: 2+ and symmetric    Lab Results: Recent Labs    01/19/20 0205 01/19/20 0205 01/19/20 1126 01/19/20 2050 01/20/20 0121  NA 141   < > 146*  --  145  K 2.6*   < > 2.6*  --  2.5*  CL 109   < > 113*  --  114*  CO2 18*  --   --   --  19*  GLUCOSE 161*   < > 169*  --  190*  BUN 26*   < > 24*  --  19  CREATININE 1.52*   < > 1.30*  --  1.17  CALCIUM 8.6*  --   --   --  8.5*  MG 2.2  --   --  2.1  --   PHOS 2.8  --   --   --  2.4*   < > = values in this interval not displayed.   Recent Labs    01/18/20 0517 01/18/20 0517 01/19/20 0205 01/20/20 0121  AST 21  --   --   --   ALT 16  --   --   --   ALKPHOS 68  --   --   --   BILITOT 1.1  --   --   --   PROT 5.7*  --   --   --   ALBUMIN 2.2*   < > 2.4* 2.2*   < > = values in this interval not displayed.   Recent Labs    01/18/20 0517 01/18/20 0517 01/19/20 0205 01/19/20 1126  WBC 4.4  --  4.3  --   NEUTROABS 2.3   --  2.4  --   HGB 9.7*   < > 10.4* 11.2*  HCT 31.4*   < > 33.5* 33.0*  MCV 82.0  --  82.7  --   PLT 290  --  315  --    < > = values in this interval not displayed.   No results for input(s): LABPROT, INR in the last 72 hours.    Assessment/Plan: -Generalized abdominal distention with CT scan showing distention of the small bowel as well as the colon.  Most likely generalized ileus  -Hypokalemia -Bipolar disorder  Recommendations -------------------------- -Colonoscopy yesterday showed no evidence of stricture or obstruction.  There was large amount of liquid stool which was suction.  There was no evidence of solid stool in the colon.  His  abdominal distention is most likely from generalized ileus.  -His potassium remains in range of 2.  Recommend to do aggressive supplementation to bring potassium more than 4.  -Ambulate with assistance.  Decrease narcotics.  -Continue clear liquids diet for now.  -Repeat abdominal x-ray in the morning.  -GI will follow  Otis Brace MD, [place] 01/20/2020, 10:04 AM  Contact #  [phone]

## 2020-01-21 ENCOUNTER — Inpatient Hospital Stay (HOSPITAL_COMMUNITY): Payer: Medicare HMO

## 2020-01-21 DIAGNOSIS — R140 Abdominal distension (gaseous): Secondary | ICD-10-CM | POA: Diagnosis not present

## 2020-01-21 DIAGNOSIS — R52 Pain, unspecified: Secondary | ICD-10-CM | POA: Diagnosis not present

## 2020-01-21 DIAGNOSIS — N179 Acute kidney failure, unspecified: Secondary | ICD-10-CM | POA: Diagnosis not present

## 2020-01-21 DIAGNOSIS — E876 Hypokalemia: Secondary | ICD-10-CM | POA: Diagnosis not present

## 2020-01-21 LAB — GLUCOSE, CAPILLARY
Glucose-Capillary: 133 mg/dL — ABNORMAL HIGH (ref 70–99)
Glucose-Capillary: 149 mg/dL — ABNORMAL HIGH (ref 70–99)
Glucose-Capillary: 213 mg/dL — ABNORMAL HIGH (ref 70–99)
Glucose-Capillary: 189 mg/dL — ABNORMAL HIGH (ref 70–99)
Glucose-Capillary: 198 mg/dL — ABNORMAL HIGH (ref 70–99)

## 2020-01-21 LAB — MAGNESIUM: Magnesium: 1.9 mg/dL (ref 1.7–2.4)

## 2020-01-21 LAB — RENAL FUNCTION PANEL
Albumin: 2.5 g/dL — ABNORMAL LOW (ref 3.5–5.0)
Anion gap: 14 (ref 5–15)
BUN: 14 mg/dL (ref 6–20)
CO2: 15 mmol/L — ABNORMAL LOW (ref 22–32)
Calcium: 8.6 mg/dL — ABNORMAL LOW (ref 8.9–10.3)
Chloride: 111 mmol/L (ref 98–111)
Creatinine, Ser: 1.16 mg/dL (ref 0.61–1.24)
GFR, Estimated: >60 mL/min (ref >60)
Glucose, Bld: 147 mg/dL — ABNORMAL HIGH (ref 70–99)
Phosphorus: 1.7 mg/dL — ABNORMAL LOW (ref 2.5–4.6)
Potassium: 2.7 mmol/L — CL (ref 3.5–5.1)
Sodium: 140 mmol/L (ref 135–145)

## 2020-01-21 MED ORDER — POTASSIUM CHLORIDE 20 MEQ PO PACK
60.0000 meq | PACK | Freq: Three times a day (TID) | ORAL | Status: DC
Start: 2020-01-21 — End: 2020-01-21

## 2020-01-21 MED ORDER — BISACODYL 10 MG RE SUPP
10.0000 mg | Freq: Every day | RECTAL | Status: AC
  Filled 2020-01-21 (×2): qty 1

## 2020-01-21 MED ORDER — POTASSIUM CHLORIDE 20 MEQ PO PACK
80.0000 meq | PACK | Freq: Three times a day (TID) | ORAL | Status: AC
  Administered 2020-01-21 (×3): 80 meq via ORAL
  Filled 2020-01-21 (×3): qty 4

## 2020-01-21 MED ORDER — POTASSIUM CHLORIDE 10 MEQ/100ML IV SOLN
10.0000 meq | INTRAVENOUS | Status: AC
  Administered 2020-01-21 (×5): 10 meq via INTRAVENOUS
  Filled 2020-01-21 (×5): qty 100

## 2020-01-21 NOTE — Progress Notes (Signed)
Patient reports his throat feels funny since getting the fentanyl.  He says it feels like it's swelling.  VS taken-see Epic.  BP elevated, PRN hydralazine given.  Pt denies new SOB, chest pain, itching, back pain, or abdominal pain.    Pt received fentanyl on day shift today.  Pt denies any symptoms with that dose.    Pt requested to sit on side of bed; assisted to that position.  When asked if his throat is any better, patient states "a little bit".  Will continue to monitor.

## 2020-01-21 NOTE — Progress Notes (Signed)
CRITICAL VALUE STICKER  CRITICAL VALUE: 2.7 Potassium  RECEIVER (on-site recipient of call): Jonelle Sports RN  DATE & TIME NOTIFIED:  01/21/20 639  MESSENGER (representative from lab): Sander Radon  MD NOTIFIED: No -notified MD aware, Pt chronic low K+  TIME OF NOTIFICATION:   RESPONSE:

## 2020-01-21 NOTE — TOC Progression Note (Addendum)
Transition of Care [facility]) - Progression Note    Patient Details  Name: [name] MRN: [id number] Date of Birth: Mar 30, 1960  [facility] of [facility] CM/SW Contact  Curlene Labrum, RN Phone Number: 01/21/2020, 3:34 PM  Clinical Narrative:    Case management continues to follow the patient for SNF placement at [facility].  The patient will need outpatient palliative set up prior to patient's discharge to the facility.  The patient is not medically ready for discharge for greater than the next 3 days.  Will continue to follow for discharge to a SNF facility - [facility].  01/21/2020 [place], RNCM with [facility] - and she was given referral to follow up with the patient for outpatient palliative to the [facility] facility.   Expected Discharge Plan: [place] Barriers to Discharge: Continued Medical Work up  Expected Discharge Plan and Services Expected Discharge Plan: [place] Choice: [place] arrangements for the past 2 months: Single Family Home                                       Social Determinants of Health (SDOH) Interventions    Readmission Risk Interventions Readmission Risk Prevention Plan 01/12/2020  Transportation Screening Complete  Palliative Care Screening Not Applicable  Some recent data might be hidden

## 2020-01-21 NOTE — Progress Notes (Signed)
[facility] PROGRESS NOTE    Progress Note  [name]  SJG:[id number] DOB: [birth year] DOA: 12/31/2019 PCP: [name], NP     Brief Narrative:   [name] is an 59 y.o. male past medical history significant for essential hypertension, poorly controlled diabetes mellitus, chronic leukopenia CVA history of substance abuse presents secondary to body ache with evidence of mild rhabdomyolysis and fluid overloaded, found to be in nephrotic syndrome and acute kidney injury started on IV diuresis neurology was consulted subsequently neurology signed off the patient developed abdominal distention with ileus and hypokalemia he intermittently refused treatment with potassium supplementation has remove IV lines and NG tube surgery was consulted for ileus and appreciate assistance.  Palliative care has been consulted for goals of care discussion.  Assessment/Plan:   Nephrotic syndrome: Patient was started on a Lasix drip subsequently switched to oral Lasix by nephrology, he diuresed well nephrology signed off on 01/16/2020. As the patient was made n.p.o. he was switched back to IV Lasix.  Lasix was discontinued 01/17/2020 because of severe hyperkalemia. In the chart he is negative about 20 L since admission, his weight has significantly decreased, from 136 kg on admission 211 kg now.  Ileus: With abdominal distention abdominal pain x-ray showed persistent colonic dilation and small bowel dilation. CT scan of the abdomen with contrast suggest early partial small bowel obstruction. Patient refused surgery NG tube, GI was consulted to perform a flex sig with decompression of his colon. Awaiting physical therapy evaluation. Try tp keep potassium greater than 4 magnesium greater than 2, he has agreed and we will discontinue fentanyl, his abdominal x-ray looks better. Repeat abdominal x-ray in the morning.  Acute kidney injury on chronic kidney disease stage III: In the setting of  nephrotic syndrome and ACE inhibitor use. With a baseline creatinine of 1.1 benazepril held on admission Lasix was held, we have KVO IV fluids, his creatinine has returned to baseline. His potassium continues to be significantly low he is agreed to take oral potassium.  Severe hypokalemia: Potassium continues to be persistently low he did take his oral potassium yesterday, will give him IV and oral recheck of basic metabolic panel in the morning. Continue to hold diuresis due to low potassium.  Hypomagnesemia: Now greater than 2 continue to monitor intermittently.  Intermittent agitation: Psych was consulted they deemed that the patient has capacity to make medical decisions. Palliative care was consulted CODE STATUS was changed to DNR appreciate palliative care assistance.  Chronic diastolic heart failure: Peers to be significantly fluid overloaded this morning.  Essential hypertension: Blood pressure continues to be slightly elevated IV fluids have been KVO continue atenolol diltiazem and Norvasc. Once potassium is improved can restart him back on his Lasix.  Normocytic anemia: Follow-up with PCP as an outpatient.  Diabetes mellitus type 2: The patient is currently on Lantus 20 units plus sliding scale leukosis fairly controlled.  Bicuspid aortic valve/moderate aortic stenosis: Follow-up with cardiology as an outpatient, noted.  Urinary retention: Foley placed on11/14/2021 will need outpatient urologic evaluation probably due to BPH.    DVT prophylaxis: lovenox Family Communication:none Status is: Inpatient  Remains inpatient appropriate because:Hemodynamically unstable   Dispo: The patient is from: Home              Anticipated d/c is to: Home              Anticipated d/c date is: > 3 days  Patient currently is not medically stable to d/c.        Code Status:     Code Status Orders  (From admission, onward)         Start     Ordered    01/17/20 1708  Do not attempt resuscitation (DNR)  Continuous       Question Answer Comment  In the event of cardiac or respiratory ARREST Do not call a "code blue"   In the event of cardiac or respiratory ARREST Do not perform Intubation, CPR, defibrillation or ACLS   In the event of cardiac or respiratory ARREST Use medication by any route, position, wound care, and other measures to relive pain and suffering. May use oxygen, suction and manual treatment of airway obstruction as needed for comfort.      01/17/20 1708        Code Status History    Date Active Date Inactive Code Status Order ID Comments User Context   12/31/2019 2311 01/17/2020 1708 Full Code [id number]  [name], MD ED   12/31/2017 1739 01/02/2018 1315 Full Code [id number]  [name], [name], MD Inpatient   09/28/2017 1434 10/01/2017 1948 Full Code [id number]  [name], Grano, DO ED   07/30/2017 2111 08/04/2017 1746 Full Code [id number]  Rankin, Mercy Moore, NP Inpatient   07/29/2017 2347 07/30/2017 2033 Full Code [id number]  Recardo Evangelist, PA-C ED   01/08/2015 2013 01/09/2015 2017 Full Code [id number]  [name], MD Inpatient   01/04/2015 1915 01/08/2015 2013 Full Code [id number]  [name], MD Inpatient   12/01/2014 1735 12/02/2014 1242 Full Code [id number]  [name], MD ED   08/28/2013 1748 08/31/2013 1627 Full Code [id number]  [name], NP Inpatient   08/28/2013 1301 08/28/2013 1748 Full Code [id number]  [name], MD ED   05/10/2011 2141 05/12/2011 1747 Full Code [id number]  Vertell Limber ED   Advance Care Planning Activity        IV Access:    Peripheral IV   Procedures and diagnostic studies:   DG Abd 2 Views  Result Date: 01/21/2020 CLINICAL DATA:  59 year old male with abdominal pain. Bowel obstruction versus ileus. EXAM: ABDOMEN - 2 VIEW COMPARISON:  01/19/2020 radiographs and earlier, CT Abdomen and Pelvis 01/16/2020. FINDINGS: Upright and supine views. Negative lung bases. No  pneumoperitoneum identified. Gas-filled small bowel loops throughout the abdomen and pelvis over this series of exams this month, with regressed dilatation of the sigmoid colon since 01/09/2020. Today the bowel gas pattern has not significantly changed since 01/19/2020, with increased small bowel gas since 01/09/2020. No acute osseous abnormality identified. IMPRESSION: 1. Bowel-gas pattern has been abnormal this month, with largely resolved dilatation of the sigmoid colon since 01/05/2020, but otherwise no improvement. Versus a relatively normal gas pattern evident on chest CTA in October this year. 2. The constellation favors persistent severe ileus over a mechanical obstruction. No pneumoperitoneum is identified. 3. Lung bases appear negative. Electronically Signed   By: Genevie Ann M.D.   On: 01/21/2020 07:24   DG Abd 2 Views  Result Date: 01/19/2020 CLINICAL DATA:  Abdominal distension and pain EXAM: ABDOMEN - 2 VIEW COMPARISON:  January 19, 2020 study obtained earlier in the day FINDINGS: Supine and upright images were obtained. There remain multiple loops of dilated small bowel without appreciable air-fluid levels. No appreciable free air. Visualized lung bases clear. No abnormal calcifications. IMPRESSION: Persistent dilatation of multiple small bowel loops. No free air. Appearance consistent  with ileus, although a degree of small bowel obstruction cannot be excluded. Electronically Signed   By: Lowella Grip III M.D.   On: 01/19/2020 13:26     Medical Consultants:    None.  Anti-Infectives:   none  Subjective:    [name] he relates he is passing gas has had liquid bowel movements abdominal pain is improving.  Objective:    Vitals:   01/20/20 1930 01/21/20 0016 01/21/20 0300 01/21/20 0441  BP: (!) 156/78 (!) 180/95 (!) 166/70   Pulse: 75 73 75   Resp: 16 14 16    Temp: 97.8 F (36.6 C) (!) 97.5 F (36.4 C) 97.9 F (36.6 C)   TempSrc: Oral Oral Oral   SpO2: 99% 100%  99%   Weight:    116 kg  Height:       SpO2: 99 %   Intake/Output Summary (Last 24 hours) at 01/21/2020 0901 Last data filed at 01/21/2020 0600 Gross per 24 hour  Intake 1223.45 ml  Output 875 ml  Net 348.45 ml   Filed Weights   01/19/20 1100 01/20/20 0500 01/21/20 0441  Weight: 108.9 kg 115.9 kg 116 kg    Exam: General exam: In no acute distress. Respiratory system: Good air movement and clear to auscultation. Cardiovascular system: S1 & S2 heard, RRR. No JVD. Gastrointestinal system: Abdomen is distended soft nontender. Extremities: No pedal edema. Skin: No rashes, lesions or ulcers   Data Reviewed:    Labs: Basic Metabolic Panel: Recent Labs  Lab 01/17/20 0235 01/17/20 1403 01/18/20 0016 01/18/20 0016 01/18/20 0517 01/18/20 0517 01/18/20 1254 01/18/20 2030 01/19/20 0205 01/19/20 0205 01/19/20 1126 01/19/20 1126 01/19/20 2050 01/20/20 0121 01/21/20 0453  NA 140   < > 140   < > 142  --   --   --  141  --  146*  --   --  145 140  K 2.4*   < > 2.4*   < > 2.2*   < > 2.5*   < > 2.6*   < > 2.6*   < >  --  2.5* 2.7*  CL 99   < > 105   < > 108  --   --   --  109  --  113*  --   --  114* 111  CO2 23   < > 23  --  23  --   --   --  18*  --   --   --   --  19* 15*  GLUCOSE 186*   < > 149*   < > 146*  --   --   --  161*  --  169*  --   --  190* 147*  BUN 28*   < > 27*   < > 27*  --   --   --  26*  --  24*  --   --  19 14  CREATININE 1.27*   < > 1.63*   < > 1.68*  --   --   --  1.52*  --  1.30*  --   --  1.17 1.16  CALCIUM 8.8*   < > 8.5*  --  8.4*  --   --   --  8.6*  --   --   --   --  8.5* 8.6*  MG  --   --   --   --  1.6*  --  2.1  --  2.2  --   --   --  2.1  --  1.9  PHOS 4.8*  --   --   --  3.6  --   --   --  2.8  --   --   --   --  2.4* 1.7*   < > = values in this interval not displayed.   GFR Estimated Creatinine Clearance: 84.5 mL/min (by C-G formula based on SCr of 1.16 mg/dL). Liver Function Tests: Recent Labs  Lab 01/17/20 0235 01/18/20 0517  01/19/20 0205 01/20/20 0121 01/21/20 0453  AST  --  21  --   --   --   ALT  --  16  --   --   --   ALKPHOS  --  68  --   --   --   BILITOT  --  1.1  --   --   --   PROT  --  5.7*  --   --   --   ALBUMIN 2.1* 2.2* 2.4* 2.2* 2.5*   No results for input(s): LIPASE, AMYLASE in the last 168 hours. Recent Labs  Lab 01/18/20 0517  AMMONIA 39*   Coagulation profile No results for input(s): INR, PROTIME in the last 168 hours. COVID-19 Labs  No results for input(s): DDIMER, FERRITIN, LDH, CRP in the last 72 hours.  Lab Results  Component Value Date   SARSCOV2NAA NEGATIVE 01/03/2020   [place] NEGATIVE 12/31/2019   [place] NEGATIVE 12/12/2019    CBC: Recent Labs  Lab 01/16/20 0236 01/18/20 0517 01/19/20 0205 01/19/20 1126  WBC 4.6 4.4 4.3  --   NEUTROABS 2.5 2.3 2.4  --   HGB 9.8* 9.7* 10.4* 11.2*  HCT 31.0* 31.4* 33.5* 33.0*  MCV 81.6 82.0 82.7  --   PLT 303 290 315  --    Cardiac Enzymes: No results for input(s): CKTOTAL, CKMB, CKMBINDEX, TROPONINI in the last 168 hours. BNP (last 3 results) No results for input(s): PROBNP in the last 8760 hours. CBG: Recent Labs  Lab 01/20/20 1147 01/20/20 1559 01/20/20 1938 01/20/20 2352 01/21/20 0354  GLUCAP 144* 121* 131* 133* 133*   D-Dimer: No results for input(s): DDIMER in the last 72 hours. Hgb A1c: No results for input(s): HGBA1C in the last 72 hours. Lipid Profile: No results for input(s): CHOL, HDL, LDLCALC, TRIG, CHOLHDL, LDLDIRECT in the last 72 hours. Thyroid function studies: No results for input(s): TSH, T4TOTAL, T3FREE, THYROIDAB in the last 72 hours.  Invalid input(s): FREET3 Anemia work up: No results for input(s): VITAMINB12, FOLATE, FERRITIN, TIBC, IRON, RETICCTPCT in the last 72 hours. Sepsis Labs: Recent Labs  Lab 01/16/20 0236 01/18/20 0517 01/19/20 0205  WBC 4.6 4.4 4.3   Microbiology Recent Results (from the past 240 hour(s))  Culture, Urine     Status: None   Collection Time:  01/16/20  4:49 PM   Specimen: Urine, Random  Result Value Ref Range Status   Specimen Description URINE, RANDOM  Final   Special Requests NONE  Final   Culture   Final    NO GROWTH Performed at [facility], [address], [place], [place] [address]    Report Status 01/17/2020 FINAL  Final     Medications:   . amLODipine  5 mg Oral Daily  . atenolol  25 mg Oral Daily  . Chlorhexidine Gluconate Cloth  6 each Topical Daily  . diltiazem  180 mg Oral Daily  . enoxaparin (LOVENOX) injection  55 mg Subcutaneous Daily  . insulin aspart  0-5 Units Subcutaneous  QHS  . insulin aspart  0-9 Units Subcutaneous TID WC  . insulin glargine  10 Units Subcutaneous Daily  . pantoprazole (PROTONIX) IV  40 mg Intravenous Q24H  . potassium chloride  60 mEq Oral TID  . sodium chloride flush  3 mL Intravenous Q12H  . sodium chloride flush  3 mL Intravenous Q12H  . tamsulosin  0.8 mg Oral Daily   Continuous Infusions: . sodium chloride Stopped (01/20/20 1900)      LOS: 17 days   Charlynne Cousins  [facility]  01/21/2020, 9:01 AM

## 2020-01-21 NOTE — Progress Notes (Signed)
Physical Therapy Treatment Patient Details Name: [name] MRN: [id number] DOB: 10/02/1960 Today's Date: 01/21/2020    History of Present Illness 59 y.o. male with medical history significant for hypertension, poorly controlled diabetes mellitus, chronic leukopenia, history of CVA, and history of substance abuse, now presenting to the emergency department for evaluation of aches. He has not taken any of his medicines in a couple days at least, and has developed cramping pain mainly in his legs. Pt found to be hypertensive, hypokalemic and nephrotic syndrome.    PT Comments    Pt up in power chair on staff arrival to room, agreeable to therapy session and with good participation and tolerance for mobility. Pt continues to demonstrate poor proximal BLE strength, needing active assist for hip flexion against gravity, and needed +26maxA for sit<>stand from power chair height to RW as well as stand pivot transfer. Pt able to initiate weight shifting/86ft gait trial using RW but very unsteady and would benefit from chair follow to progress gait distance next session. Pt continues to benefit from skilled rehab in a post acute setting to maximize functional gains before returning home.  Follow Up Recommendations  SNF     Equipment Recommendations  Hospital bed;Rolling walker with 5" wheels;Other (comment) (mechanical lift, hospital bed)    Recommendations for Other Services       Precautions / Restrictions Precautions Precautions: Fall Precaution Comments: BLE edema limits joint ROM Restrictions Weight Bearing Restrictions: No    Mobility  Bed Mobility Overal bed mobility: Needs Assistance Bed Mobility: Sit to Supine       Sit to supine: Mod assist   General bed mobility comments: pt with heavy use of bed features, needs modA for BLE assist and bed pad assist for hip translation; HOB flat; +53maxA and use of overhead rail for posterior supine scoot (bed partially in  trendelenburg)  Transfers Overall transfer level: Needs assistance Equipment used: Rolling walker (2 wheeled) Transfers: Sit to/from [facility] Sit to Stand: +2 physical assistance;Max assist Stand pivot transfers: Max assist;+2 physical assistance       General transfer comment: stood from power chair to RW, needed x2 attempts with +1 assist but unable to fully stand; x2 attempt with +31maxA and able to stand on final attempt; pt needs +7maxA for stand pivot and manual assist to position RW and cues for step sequencing  Ambulation/Gait Ambulation/Gait assistance: Max assist;+2 physical assistance Gait Distance (Feet): 3 Feet Assistive device: Rolling walker (2 wheeled) Gait Pattern/deviations: Step-to pattern;Decreased stride length;Shuffle;Leaning posteriorly;Trunk flexed Gait velocity: grossly <0.3 m/s Gait velocity interpretation: <1.31 ft/sec, indicative of household ambulator General Gait Details: difficulty fully extending hips, max cues for posture, step sequencing and RW manually assisted, pt sliding feet unable to fully offload and very small shuffled steps, heavy assist due to posterior lean although trunk remains partially flexed t/o   Stairs             Wheelchair Mobility    Modified Rankin (Stroke Patients Only)       Balance Overall balance assessment: Needs assistance Sitting-balance support: Feet supported;Single extremity supported Sitting balance-Leahy Scale: Fair Sitting balance - Comments: able to sit EOB for Supervision ~3 minutes prior to return to supine Postural control: Posterior lean Standing balance support: Bilateral upper extremity supported;During functional activity Standing balance-Leahy Scale: Poor Standing balance comment: +59maxA for balance; heavy BUE reliance on RW  Cognition Arousal/Alertness: Awake/alert Behavior During Therapy: Anxious;Flat affect Overall Cognitive Status:  No family/caregiver present to determine baseline cognitive functioning                                 General Comments: Pt has difficulty with active listening. He requires failed attempts to follow commands to achieve mobility tasks and very slow to process and initiate mobility tasks.      Exercises General Exercises - Lower Extremity Ankle Circles/Pumps: AROM;Strengthening;Both;10 reps;Seated Long Arc Quad: AAROM;Strengthening;Both;10 reps;Seated Hip Flexion/Marching: AAROM;Strengthening;Both;10 reps;Seated;Limitations Hip Flexion/Marching Limitations: pt unable to lift foot off floor without AA (able to raise heels up but not lift forefoot from floor w/o assist)    General Comments General comments (skin integrity, edema, etc.): VSS on RA      Pertinent Vitals/Pain Pain Assessment: Faces Faces Pain Scale: Hurts a little bit Pain Location: abominal discomfort and neck discomfort with bed position Pain Descriptors / Indicators: Guarding;Grimacing;Tightness Pain Intervention(s): Monitored during session;Repositioned (pt instructed on importance of floating heels)   Vitals:   01/21/20 1041  BP: (!) 171/98  Pulse: 81  Resp:   Temp: 97.6 F (36.4 C)  SpO2: 99%    Home Living                      Prior Function            PT Goals (current goals can now be found in the care plan section) Acute Rehab PT Goals Patient Stated Goal: to get back to bed. PT Goal Formulation: With patient Time For Goal Achievement: 01/19/20 Potential to Achieve Goals: Fair Progress towards PT goals: Progressing toward goals    Frequency    Min 2X/week      PT Plan Current plan remains appropriate    Co-evaluation              AM-PAC PT "6 Clicks" Mobility   Outcome Measure  Help needed turning from your back to your side while in a flat bed without using bedrails?: A Little Help needed moving from lying on your back to sitting on the side of a flat  bed without using bedrails?: A Lot Help needed moving to and from a bed to a chair (including a wheelchair)?: Total Help needed standing up from a chair using your arms (e.g., wheelchair or bedside chair)?: Total Help needed to walk in hospital room?: Total Help needed climbing 3-5 steps with a railing? : Total 6 Click Score: 9    End of Session Equipment Utilized During Treatment: Gait belt Activity Tolerance: Patient tolerated treatment well Patient left: in bed;with call bell/phone within reach;with bed alarm set Nurse Communication: Mobility status;Need for lift equipment (need Stedy and +2 if nsg performing) PT Visit Diagnosis: Other abnormalities of gait and mobility (R26.89);Muscle weakness (generalized) (M62.81);Difficulty in walking, not elsewhere classified (R26.2)     Time: 1514-1600 PT Time Calculation (min) (ACUTE ONLY): 46 min  Charges:  $Gait Training: 8-22 mins $Therapeutic Exercise: 8-22 mins $Therapeutic Activity: 8-22 mins                     Nam Vossler P., PTA Acute Rehabilitation Services Pager: [phone] Office: [place] 01/21/2020, 4:17 PM

## 2020-01-21 NOTE — Progress Notes (Signed)
Subjective: No abdominal pain. Not passing much flatus.  Objective: Vital signs in last 24 hours: Temp:  [97.5 F (36.4 C)-97.9 F (36.6 C)] 97.9 F (36.6 C) (11/26 0300) Pulse Rate:  [70-75] 75 (11/26 0300) Resp:  [14-16] 16 (11/26 0300) BP: (145-180)/(70-95) 166/70 (11/26 0300) SpO2:  [98 %-100 %] 99 % (11/26 0300) Weight:  [115 kg] 116 kg (11/26 0441) Weight change: 7.137 kg Last BM Date: 01/18/20  PE: GEN:  NAD, in wheelchair ABD:   Moderate to severe distention, + tympany, non-tender  Lab Results: CBC    Component Value Date/Time   WBC 4.3 01/19/2020 0205   RBC 4.05 (L) 01/19/2020 0205   HGB 11.2 (L) 01/19/2020 1126   HGB 13.9 12/31/2013 1336   HCT 33.0 (L) 01/19/2020 1126   HCT 43.0 12/31/2013 1336   PLT 315 01/19/2020 0205   PLT 184 12/31/2013 1336   MCV 82.7 01/19/2020 0205   MCV 81 12/31/2013 1336   MCH 25.7 (L) 01/19/2020 0205   MCHC 31.0 01/19/2020 0205   RDW 15.0 01/19/2020 0205   RDW 14.4 12/31/2013 1336   LYMPHSABS 1.0 01/19/2020 0205   MONOABS 0.7 01/19/2020 0205   EOSABS 0.2 01/19/2020 0205   BASOSABS 0.0 01/19/2020 0205   CMP     Component Value Date/Time   NA 140 01/21/2020 0453   NA 135 (L) 12/31/2013 1336   K 2.7 (LL) 01/21/2020 0453   K 3.8 12/31/2013 1336   CL 111 01/21/2020 0453   CL 101 12/31/2013 1336   CO2 15 (L) 01/21/2020 0453   CO2 25 12/31/2013 1336   GLUCOSE 147 (H) 01/21/2020 0453   GLUCOSE 249 (H) 12/31/2013 1336   BUN 14 01/21/2020 0453   BUN 8 12/31/2013 1336   CREATININE 1.16 01/21/2020 0453   CREATININE 0.88 12/31/2013 1336   CALCIUM 8.6 (L) 01/21/2020 0453   CALCIUM 8.6 12/31/2013 1336   PROT 5.7 (L) 01/18/2020 0517   PROT 7.8 12/31/2013 1336   ALBUMIN 2.5 (L) 01/21/2020 0453   ALBUMIN 3.4 12/31/2013 1336   AST 21 01/18/2020 0517   AST 23 12/31/2013 1336   ALT 16 01/18/2020 0517   ALT 20 12/31/2013 1336   ALKPHOS 68 01/18/2020 0517   ALKPHOS 76 12/31/2013 1336   BILITOT 1.1 01/18/2020 0517   BILITOT 0.6  12/31/2013 1336   GFRNONAA >60 01/21/2020 0453   GFRNONAA >60 12/31/2013 1336   GFRAA >60 01/29/2018 1705   GFRAA >60 12/31/2013 1336    Studies/Results: abd xray:  Stable enlarged small and large bowel dilatation  Assessment:  1.  Abdominal distention with suspected generalized ileus.  No obvious obstruction on colonoscopy or imaging. 2.  Profound hypokalemia. 3.  Bipolar disorder. 4.  Decreased mobility.  Plan:  1.  Would significantly cut back, if not stop entirely, narcotics. 2.  Increase mobility as tolerated, PT eval? 3.  Aggressive repletion of potassium. 4.  Suppositories for bowel stimulation. 5.  Don't see utility in further decompressive colonoscopies in absence of correction of issues 1-3 above. 6.  Case discussed with Dr. Olevia Bowens. 7.  [facility] will revisit Sunday.   [name] 01/21/2020, 9:07 AM   Cell [phone] If no answer or after 5 PM call [phone]

## 2020-01-22 ENCOUNTER — Inpatient Hospital Stay (HOSPITAL_COMMUNITY): Payer: Medicare HMO

## 2020-01-22 DIAGNOSIS — E876 Hypokalemia: Secondary | ICD-10-CM | POA: Diagnosis not present

## 2020-01-22 DIAGNOSIS — R140 Abdominal distension (gaseous): Secondary | ICD-10-CM | POA: Diagnosis not present

## 2020-01-22 DIAGNOSIS — N179 Acute kidney failure, unspecified: Secondary | ICD-10-CM | POA: Diagnosis not present

## 2020-01-22 DIAGNOSIS — R52 Pain, unspecified: Secondary | ICD-10-CM | POA: Diagnosis not present

## 2020-01-22 LAB — RENAL FUNCTION PANEL
Albumin: 2.7 g/dL — ABNORMAL LOW (ref 3.5–5.0)
Anion gap: 12 (ref 5–15)
BUN: 15 mg/dL (ref 6–20)
CO2: 16 mmol/L — ABNORMAL LOW (ref 22–32)
Calcium: 8.7 mg/dL — ABNORMAL LOW (ref 8.9–10.3)
Chloride: 110 mmol/L (ref 98–111)
Creatinine, Ser: 1.7 mg/dL — ABNORMAL HIGH (ref 0.61–1.24)
GFR, Estimated: 46 mL/min — ABNORMAL LOW (ref >60)
Glucose, Bld: 205 mg/dL — ABNORMAL HIGH (ref 70–99)
Phosphorus: 2 mg/dL — ABNORMAL LOW (ref 2.5–4.6)
Potassium: 3 mmol/L — ABNORMAL LOW (ref 3.5–5.1)
Sodium: 138 mmol/L (ref 135–145)

## 2020-01-22 LAB — BASIC METABOLIC PANEL
Anion gap: 9 (ref 5–15)
BUN: 19 mg/dL (ref 6–20)
CO2: 16 mmol/L — ABNORMAL LOW (ref 22–32)
Calcium: 8.8 mg/dL — ABNORMAL LOW (ref 8.9–10.3)
Chloride: 115 mmol/L — ABNORMAL HIGH (ref 98–111)
Creatinine, Ser: 1.73 mg/dL — ABNORMAL HIGH (ref 0.61–1.24)
GFR, Estimated: 45 mL/min — ABNORMAL LOW (ref >60)
Glucose, Bld: 142 mg/dL — ABNORMAL HIGH (ref 70–99)
Potassium: 2.5 mmol/L — CL (ref 3.5–5.1)
Sodium: 140 mmol/L (ref 135–145)

## 2020-01-22 LAB — GLUCOSE, CAPILLARY
Glucose-Capillary: 157 mg/dL — ABNORMAL HIGH (ref 70–99)
Glucose-Capillary: 164 mg/dL — ABNORMAL HIGH (ref 70–99)
Glucose-Capillary: 177 mg/dL — ABNORMAL HIGH (ref 70–99)
Glucose-Capillary: 119 mg/dL — ABNORMAL HIGH (ref 70–99)
Glucose-Capillary: 114 mg/dL — ABNORMAL HIGH (ref 70–99)
Glucose-Capillary: 109 mg/dL — ABNORMAL HIGH (ref 70–99)

## 2020-01-22 LAB — MAGNESIUM: Magnesium: 1.9 mg/dL (ref 1.7–2.4)

## 2020-01-22 MED ORDER — SPIRONOLACTONE 25 MG PO TABS
25.0000 mg | ORAL_TABLET | Freq: Every day | ORAL | Status: DC
  Administered 2020-01-22: 25 mg via ORAL
  Filled 2020-01-22: qty 1

## 2020-01-22 MED ORDER — POTASSIUM CHLORIDE 20 MEQ PO PACK
80.0000 meq | PACK | Freq: Three times a day (TID) | ORAL | Status: DC
  Filled 2020-01-22: qty 4

## 2020-01-22 MED ORDER — SPIRONOLACTONE 25 MG PO TABS
50.0000 mg | ORAL_TABLET | Freq: Every day | ORAL | Status: DC
  Administered 2020-01-23 – 2020-01-31 (×9): 50 mg via ORAL
  Filled 2020-01-22 (×9): qty 2

## 2020-01-22 MED ORDER — POTASSIUM CHLORIDE CRYS ER 20 MEQ PO TBCR
40.0000 meq | EXTENDED_RELEASE_TABLET | Freq: Three times a day (TID) | ORAL | Status: DC
  Administered 2020-01-22 (×2): 40 meq via ORAL
  Filled 2020-01-22 (×3): qty 2

## 2020-01-22 MED ORDER — SODIUM CHLORIDE 0.9 % IV SOLN: INTRAVENOUS | Status: DC

## 2020-01-22 MED ORDER — POTASSIUM CHLORIDE IN NACL 40-0.9 MEQ/L-% IV SOLN
INTRAVENOUS | Status: AC
  Filled 2020-01-22 (×2): qty 1000

## 2020-01-22 NOTE — Progress Notes (Signed)
Hydrologist [facility])  Hospital Liaison: RN note         Notified by [facility] manager of patient/family request for [facility] Palliative services at [facility] after discharge.             [facility]iative team will follow up with patient after discharge.         Please call with any hospice or palliative related questions.         Thank you for this referral.         Farrel Gordon, RN, CCM  [place] (listed on [place] under Hospice[facility])    [phone]

## 2020-01-22 NOTE — Progress Notes (Signed)
Patient has been refusing to take po potassium, despite several attempts to encourage him to do so.  MD aware.  See NO.

## 2020-01-22 NOTE — Progress Notes (Signed)
Patient in midst of PT when I went to see him.  Case reviewed with Dr. Olevia Bowens.  Advised stop/minimize narcotics, continue to correct hypokalemia, mobilize as possible.  [facility] will revisit tomorrow.

## 2020-01-22 NOTE — Progress Notes (Signed)
[facility] PROGRESS NOTE    Progress Note  [name]  KKX:[id number] DOB: [birth year] DOA: 12/31/2019 PCP: [name], NP     Brief Narrative:   [name] is an Roy y.o. [name] past medical history significant for essential hypertension, poorly controlled diabetes mellitus, chronic leukopenia CVA history of substance abuse presents secondary to body ache with evidence of mild rhabdomyolysis and fluid overloaded, found to be in nephrotic syndrome and acute kidney injury started on IV diuresis neurology was consulted subsequently neurology signed off the patient developed abdominal distention with ileus and hypokalemia he intermittently refused treatment with potassium supplementation has remove IV lines and NG tube surgery was consulted for ileus and appreciate assistance.  Palliative care has been consulted for goals of care discussion.  Assessment/Plan:   Nephrotic syndrome: Patient was started on a Lasix drip subsequently switched to oral Lasix by nephrology, he diuresed well nephrology signed off on 01/16/2020. Lasix was discontinued 01/17/2020 because of severe hyperkalemia, which is now improving. In the chart he is negative about 14 L since admission, today he is 116 kg, which is significantly improved compare from admission but is trending up from several days ago,he still appears severely fluid overloaded. We'll go ahead and reconsult nephrology to manage his diuretic therapy we are holding Lasix due to his severe hypokalemia  Ileus: CT scan of the abdomen with contrast suggest early partial small bowel obstruction. Patient refused surgery, he removed his NG tube, GI was consulted to perform a flex sig with decompression of his colon. His abdominal x-rays look slightly improved from previous days. Further management per GI. Try to keep potassium greater than 4 magnesium greater than 2 continue replete potassium orally and IV. We will check an abdominal ultrasound  to rule out ascites.  Acute kidney injury on chronic kidney disease stage III: In the setting of nephrotic syndrome and ACE inhibitor use. With a baseline creatinine of 1.1 benazepril held on admission, his Lasix had to be held due to severe hypokalemia Creatinine is significantly worse today we will go ahead and consult nephrology, will start on gentle IV fluid hydration with potassium supplementation. Urine should be urinary sodium to monitor his renal perfusion continue to monitor strict I's and O's  New metabolic acidosis normal anion gap: Further management per renal.  Resume renal disease.  Severe hypokalemia: Potassium is slowly improving, today is 3.0 we will continue oral repletion as well as IV repletion. Continue to hold diuresis due to low potassium.  Hypomagnesemia: Keep magnesium greater than 2. Recheck a magnesium level tomorrow morning.  Intermittent agitation: Psych was consulted they deemed that the patient has capacity to make medical decisions. Palliative care was consulted CODE STATUS was changed to DNR appreciate palliative care assistance.  Chronic diastolic heart failure: He is significantly fluid overloaded, and Lasix had to be held due to severe hypokalemia  Essential hypertension: Blood pressure continues to be slightly elevated IV fluids have been KVO continue atenolol diltiazem and Norvasc. Once potassium is improved can restart him back on his Lasix.  Normocytic anemia: Follow-up with PCP as an outpatient.  Diabetes mellitus type 2: The patient is currently on Lantus 20 units plus sliding scale leukosis fairly controlled.  Bicuspid aortic valve/moderate aortic stenosis: Follow-up with cardiology as an outpatient, noted.  Urinary retention: Foley placed on11/14/2021 will need outpatient urologic evaluation probably due to BPH.    DVT prophylaxis: lovenox Family Communication:none Status is: Inpatient  Remains inpatient appropriate  because:Hemodynamically unstable  Dispo: The patient is from: Home              Anticipated d/c is to: Home              Anticipated d/c date is: > 3 days              Patient currently is not medically stable to d/c.    Code Status:     Code Status Orders  (From admission, onward)         Start     Ordered   01/17/20 1708  Do not attempt resuscitation (DNR)  Continuous       Question Answer Comment  In the event of cardiac or respiratory ARREST Do not call a "code blue"   In the event of cardiac or respiratory ARREST Do not perform Intubation, CPR, defibrillation or ACLS   In the event of cardiac or respiratory ARREST Use medication by any route, position, wound care, and other measures to relive pain and suffering. May use oxygen, suction and manual treatment of airway obstruction as needed for comfort.      01/17/20 1708        Code Status History    Date Active Date Inactive Code Status Order ID Comments User Context   12/31/2019 2311 01/17/2020 1708 Full Code [id number]  [name], MD ED   12/31/2017 1739 01/02/2018 1315 Full Code [id number]  [name], [name], MD Inpatient   09/28/2017 1434 10/01/2017 1948 Full Code [id number]  [name], [place], DO ED   07/30/2017 2111 08/04/2017 1746 Full Code [id number]  Rankin, Mercy Moore, NP Inpatient   07/29/2017 2347 07/30/2017 2033 Full Code [id number]  Recardo Evangelist, PA-C ED   01/08/2015 2013 01/09/2015 2017 Full Code [id number]  [name], MD Inpatient   01/04/2015 1915 01/08/2015 2013 Full Code [id number]  [name], MD Inpatient   12/01/2014 1735 12/02/2014 1242 Full Code [id number]  [name], MD ED   08/28/2013 1748 08/31/2013 1627 Full Code [id number]  [name], NP Inpatient   08/28/2013 1301 08/28/2013 1748 Full Code [id number]  [name], MD ED   05/10/2011 2141 05/12/2011 1747 Full Code [id number]  Vertell Limber ED   Advance Care Planning Activity        IV Access:    Peripheral  IV   Procedures and diagnostic studies:   DG Abd 2 Views  Result Date: 01/21/2020 CLINICAL DATA:  [name] with abdominal pain. Bowel obstruction versus ileus. EXAM: ABDOMEN - 2 VIEW COMPARISON:  01/19/2020 radiographs and earlier, CT Abdomen and Pelvis 01/16/2020. FINDINGS: Upright and supine views. Negative lung bases. No pneumoperitoneum identified. Gas-filled small bowel loops throughout the abdomen and pelvis over this series of exams this month, with regressed dilatation of the sigmoid colon since 01/09/2020. Today the bowel gas pattern has not significantly changed since 01/19/2020, with increased small bowel gas since 01/09/2020. No acute osseous abnormality identified. IMPRESSION: 1. Bowel-gas pattern has been abnormal this month, with largely resolved dilatation of the sigmoid colon since 01/05/2020, but otherwise no improvement. Versus a relatively normal gas pattern evident on chest CTA in October this year. 2. The constellation favors persistent severe ileus over a mechanical obstruction. No pneumoperitoneum is identified. 3. Lung bases appear negative. Electronically Signed   By: Genevie Ann M.D.   On: 01/21/2020 07:24   DG Abd Portable 1V  Result Date: 01/22/2020 CLINICAL DATA:  Evaluate bowel gas pattern/ileus. EXAM: PORTABLE ABDOMEN - 1 VIEW COMPARISON:  Prior radiograph from 01/21/2020 FINDINGS: Again seen are multiple prominent gas-filled loops of bowel throughout the abdomen and pelvis, including both gaseous distension of small and large bowel. Gas lucency overlying the left upper quadrant felt to reflect gas within the gastric fundus. In comparison with prior radiograph from 1 day earlier, overall appearance is perhaps mildly improved, suggesting improving ileus. No visible pneumoperitoneum or other complication. Visualized lung bases are clear. Visualized osseous structures are unchanged. IMPRESSION: Persistent diffuse gaseous distention of small and large bowel, most  suggestive of ileus. Overall, appearance is perhaps mildly improved in comparison with prior radiograph from 1 day earlier, suggesting mild improvement. Electronically Signed   By: Jeannine Boga M.D.   On: 01/22/2020 07:24     Medical Consultants:    None.  Anti-Infectives:   none  Subjective:    [name] he continues to have watery diarrhea, he relates his abdomen is less distended and less painful  Objective:    Vitals:   01/21/20 1041 01/21/20 2100 01/22/20 0540 01/22/20 0801  BP: (!) 171/98 118/77 133/78 (!) 134/95  Pulse: 81 74 75 75  Resp:  18 20 17   Temp: 97.6 F (36.4 C) 97.6 F (36.4 C) 98.5 F (36.9 C) 98 F (36.7 C)  TempSrc: Oral Oral Oral Oral  SpO2: 99% 100% 98% 96%  Weight:      Height:       SpO2: 96 %   Intake/Output Summary (Last 24 hours) at 01/22/2020 1012 Last data filed at 01/22/2020 0530 Gross per 24 hour  Intake --  Output 400 ml  Net -400 ml   Filed Weights   01/19/20 1100 01/20/20 0500 01/21/20 0441  Weight: 108.9 kg 115.9 kg 116 kg    Exam: General exam: In no acute distress. Respiratory system: Good air movement and clear to auscultation. Cardiovascular system: S1 & S2 heard, RRR. No JVD. Gastrointestinal system: Significantly distended abdomen soft and nontender. Extremities: No pedal edema. Skin: No rashes, lesions or ulcers Psychiatry: Judgement and insight appear normal. Mood & affect appropriate.   Data Reviewed:    Labs: Basic Metabolic Panel: Recent Labs  Lab 01/18/20 0517 01/18/20 0517 01/18/20 1254 01/18/20 2030 01/19/20 0205 01/19/20 0205 01/19/20 1126 01/19/20 1126 01/19/20 2050 01/20/20 0121 01/20/20 0121 01/21/20 0453 01/22/20 0206  NA 142   < >  --   --  141  --  146*  --   --  145  --  140 138  K 2.2*   < > 2.5*   < > 2.6*   < > 2.6*   < >  --  2.5*   < > 2.7* 3.0*  CL 108   < >  --   --  109  --  113*  --   --  114*  --  111 110  CO2 23  --   --   --  18*  --   --   --   --  19*   --  15* 16*  GLUCOSE 146*   < >  --   --  161*  --  169*  --   --  190*  --  147* 205*  BUN 27*   < >  --   --  26*  --  24*  --   --  19  --  14 15  CREATININE 1.68*   < >  --   --  1.52*  --  1.30*  --   --  1.17  --  1.16 1.70*  CALCIUM 8.4*  --   --   --  8.6*  --   --   --   --  8.5*  --  8.6* 8.7*  MG 1.6*  --  2.1  --  2.2  --   --   --  2.1  --   --  1.9  --   PHOS 3.6  --   --   --  2.8  --   --   --   --  2.4*  --  1.7* 2.0*   < > = values in this interval not displayed.   GFR Estimated Creatinine Clearance: 57.7 mL/min (A) (by C-G formula based on SCr of 1.7 mg/dL (H)). Liver Function Tests: Recent Labs  Lab 01/18/20 0517 01/19/20 0205 01/20/20 0121 01/21/20 0453 01/22/20 0206  AST 21  --   --   --   --   ALT 16  --   --   --   --   ALKPHOS 68  --   --   --   --   BILITOT 1.1  --   --   --   --   PROT 5.7*  --   --   --   --   ALBUMIN 2.2* 2.4* 2.2* 2.5* 2.7*   No results for input(s): LIPASE, AMYLASE in the last 168 hours. Recent Labs  Lab 01/18/20 0517  AMMONIA 39*   Coagulation profile No results for input(s): INR, PROTIME in the last 168 hours. COVID-19 Labs  No results for input(s): DDIMER, FERRITIN, LDH, CRP in the last 72 hours.  Lab Results  Component Value Date   SARSCOV2NAA NEGATIVE 01/03/2020   [place] NEGATIVE 12/31/2019   [place] NEGATIVE 12/12/2019    CBC: Recent Labs  Lab 01/16/20 0236 01/18/20 0517 01/19/20 0205 01/19/20 1126  WBC 4.6 4.4 4.3  --   NEUTROABS 2.5 2.3 2.4  --   HGB 9.8* 9.7* 10.4* 11.2*  HCT 31.0* 31.4* 33.5* 33.0*  MCV 81.6 82.0 82.7  --   PLT 303 290 315  --    Cardiac Enzymes: No results for input(s): CKTOTAL, CKMB, CKMBINDEX, TROPONINI in the last 168 hours. BNP (last 3 results) No results for input(s): PROBNP in the last 8760 hours. CBG: Recent Labs  Lab 01/21/20 1616 01/21/20 2109 01/21/20 2314 01/22/20 0642 01/22/20 0819  GLUCAP 213* 189* 198* 157* 164*   D-Dimer: No results for  input(s): DDIMER in the last 72 hours. Hgb A1c: No results for input(s): HGBA1C in the last 72 hours. Lipid Profile: No results for input(s): CHOL, HDL, LDLCALC, TRIG, CHOLHDL, LDLDIRECT in the last 72 hours. Thyroid function studies: No results for input(s): TSH, T4TOTAL, T3FREE, THYROIDAB in the last 72 hours.  Invalid input(s): FREET3 Anemia work up: No results for input(s): VITAMINB12, FOLATE, FERRITIN, TIBC, IRON, RETICCTPCT in the last 72 hours. Sepsis Labs: Recent Labs  Lab 01/16/20 0236 01/18/20 0517 01/19/20 0205  WBC 4.6 4.4 4.3   Microbiology Recent Results (from the past 240 hour(s))  Culture, Urine     Status: None   Collection Time: 01/16/20  4:49 PM   Specimen: Urine, Random  Result Value Ref Range Status   Specimen Description URINE, RANDOM  Final   Special Requests NONE  Final   Culture   Final    NO GROWTH Performed at [facility], [address], [place], [place] [address]    Report Status 01/17/2020 FINAL  Final  Medications:   . amLODipine  5 mg Oral Daily  . atenolol  25 mg Oral Daily  . bisacodyl  10 mg Rectal Q1200  . Chlorhexidine Gluconate Cloth  6 each Topical Daily  . diltiazem  180 mg Oral Daily  . enoxaparin (LOVENOX) injection  55 mg Subcutaneous Daily  . insulin aspart  0-5 Units Subcutaneous QHS  . insulin aspart  0-9 Units Subcutaneous TID WC  . insulin glargine  10 Units Subcutaneous Daily  . pantoprazole (PROTONIX) IV  40 mg Intravenous Q24H  . potassium chloride  80 mEq Oral TID  . sodium chloride flush  3 mL Intravenous Q12H  . sodium chloride flush  3 mL Intravenous Q12H  . tamsulosin  0.8 mg Oral Daily   Continuous Infusions: . sodium chloride Stopped (01/20/20 1900)  . 0.9 % NaCl with KCl 40 mEq / L        LOS: 18 days   Charlynne Cousins  [facility]  01/22/2020, 10:12 Roy

## 2020-01-22 NOTE — Progress Notes (Addendum)
Nurse tech in room with patient.  She stated to me that she turned her back to him to empty the linen and prepare his wheelchair when he sat on the side of the bed and lowered himself to the floor.  No injuries to him were seen and he MAE appropriately after he was assisted back to bed by six nurses / techs.  V/SS.  No neurological deficits were assessed.  MD notified.  NNO.  Unable to contact family member at this time.  Plan:  1) Reinforce guidelines which exist for those patients with a high fall risk.  Patient has acknowledged these guidelines, but may need continued reinforcement.   2) Continue with bed and chair alarms as appropriate. 3) Initiate use of tele-monitoring device as ordered by MD.

## 2020-01-22 NOTE — Progress Notes (Addendum)
Nephrology Follow-Up Consult note   Assessment/Recommendations: [name] is a/an 59 y.o. male with a past medical history significant for CKD, DM2, CVA, substance use disorder, HTN, admitted for volume overload c/b ileus.    AKI on CKD 3: Baseline creatinine around 1.3-1.5 but numbers tend to fluctuate.  Creatinine increased from 1.2-1.7 today.  Patient has become more volume overloaded.  Has Foley catheter in.  Unclear cause at this time -Obtain urinalysis -Restart diuretics as able as below -Continue monitor ins and outs -Daily weights -Agree with holding ARB for now -Hold any NSAIDs  Volume status: Extensively volume overloaded.  Albumin somewhat appropriate at this time.  Holding diuretics due to recent ileus.  Likely need to be restarted but need to optimize potassium first -Hypokalemia management as below -Start spironolactone 25 mg daily -Restart IV Lasix once potassium is greater than 3.7 -Monitor electrolytes twice daily while on Lasix  Hypokalemia: Recurrent issue thought to be related to diuretics.  Significantly worse recently possibly related to ileus. -Agree with aggressive oral and IV repletion -Start spironolactone 25 mg daily which will help with renal losses -Can uptitrate as needed to maintain potassium -Agree with magnesium correction; normal at this time  Hypertension: Currently on amlodipine, atenolol, diltiazem.  Benazepril held given AKI.  Blood pressure appropriate at this time -Stop atenolol given minimal efficacy -Stop diltiazem as this can cause edema and slow gastric function -Start spironolactone 25 mg daily -Restart diuretics when able -Consider stopping amlodipine as this can also contribute to swelling  Ileus: Improving per patient.  Stop diltiazem as above.  Correct potassium as this can contribute to slowed gastric function  Uncontrolled Diabetes Mellitus Type 2 with Hyperglycemia: Management per primary team    Recommendations conveyed to  primary service.    [facility] 01/22/2020 11:35 AM  ___________________________________________________________  CC: AKI on CKD  Interval History/Subjective: We were previously following the patient for volume overload and AKI.  Patient was noted to have significant proteinuria and low albumin thought to be secondary to diabetic nephropathy.  Serological work-up was obtained and negative for other obvious cause of proteinuria.  He was on diuretics given his volume overloaded and his symptoms did improve.  We signed off on 11/21 was on oral Lasix 80 mg twice daily at that time.  Creatinine was 1.3 on 11/21.  Creatinine rose up to 1.7 on 11/23.  Creatinine returned to near normal at 1.17 on 11/26 and increased to 1.7 today.  The patient had an ileus which is now resolving the patient states that he is having bowel movements.  He has extensive edema which he says has been there for a while.  He has a Foley catheter with minimal urine output documented. Diuretics have been held for several days. No obvious hypotensive events.  His weight has significantly increased as of 2 days ago.  He has had ongoing hypokalemia that has been much worse since 11/19.  Patient has intermittently refused treatments and palliative care is consulted.     Medications:  Current Facility-Administered Medications  Medication Dose Route Frequency Provider Last Rate Last Admin  . 0.9 %  sodium chloride infusion  250 mL Intravenous PRN Opyd, Ilene Qua, MD   Stopped at 01/20/20 1900  . 0.9 % NaCl with KCl 40 mEq / L  infusion   Intravenous Continuous Esmond Plants, RPH      . acetaminophen (TYLENOL) tablet 650 mg  650 mg Oral Q6H PRN Opyd, Ilene Qua,  MD   650 mg at 01/17/20 2040   Or  . acetaminophen (TYLENOL) suppository 650 mg  650 mg Rectal Q6H PRN Opyd, Ilene Qua, MD      . alum & mag hydroxide-simeth (MAALOX/MYLANTA) 200-200-20 MG/5ML suspension 30 mL  30 mL Oral Q4H PRN Aline August, MD   30 mL at 01/16/20 0607  . amLODipine (NORVASC) tablet 5 mg  5 mg Oral Daily Charlynne Cousins, MD   5 mg at 01/21/20 1042  . bisacodyl (DULCOLAX) suppository 10 mg  10 mg Rectal Q1200 Arta Silence, MD      . Chlorhexidine Gluconate Cloth 2 % PADS 6 each  6 each Topical Daily Mariel Aloe, MD   6 each at 01/21/20 1043  . diltiazem (CARDIZEM CD) 24 hr capsule 180 mg  180 mg Oral Daily Opyd, Ilene Qua, MD   180 mg at 01/21/20 1042  . enoxaparin (LOVENOX) injection 55 mg  55 mg Subcutaneous Daily von Dohlen, Haley B, RPH   55 mg at 01/21/20 1049  . haloperidol lactate (HALDOL) injection 2-5 mg  2-5 mg Intravenous Q6H PRN Aline August, MD   5 mg at 01/21/20 0243  . hydrALAZINE (APRESOLINE) tablet 50 mg  50 mg Oral Q6H PRN Opyd, Ilene Qua, MD   50 mg at 01/21/20 0019  . insulin aspart (novoLOG) injection 0-5 Units  0-5 Units Subcutaneous QHS Opyd, Ilene Qua, MD   2 Units at 01/01/20 2231  . insulin aspart (novoLOG) injection 0-9 Units  0-9 Units Subcutaneous TID WC Opyd, Ilene Qua, MD   2 Units at 01/22/20 0958  . insulin glargine (LANTUS) injection 10 Units  10 Units Subcutaneous Daily Aline August, MD   10 Units at 01/21/20 1049  . lip balm (CARMEX) ointment 1 application  1 application Topical PRN Alekh, Kshitiz, MD      . nitroGLYCERIN (NITROSTAT) SL tablet 0.4 mg  0.4 mg Sublingual Q5 min PRN Starla Link, Kshitiz, MD      . ondansetron (ZOFRAN) tablet 4 mg  4 mg Oral Q6H PRN Mariel Aloe, MD   4 mg at 01/08/20 1036   Or  . ondansetron (ZOFRAN) injection 4 mg  4 mg Intravenous Q6H PRN Mariel Aloe, MD   4 mg at 01/09/20 0303  . pantoprazole (PROTONIX) injection 40 mg  40 mg Intravenous Q24H Aline August, MD   40 mg at 01/21/20 1015  . potassium chloride (KLOR-CON) packet 80 mEq  80 mEq Oral TID Charlynne Cousins, MD      . sodium chloride flush (NS) 0.9 % injection 3 mL  3 mL Intravenous Q12H Opyd, Ilene Qua, MD   3 mL at 01/20/20 2314  . sodium chloride flush (NS)  0.9 % injection 3 mL  3 mL Intravenous Q12H Opyd, Ilene Qua, MD   3 mL at 01/21/20 2200  . sodium chloride flush (NS) 0.9 % injection 3 mL  3 mL Intravenous PRN Opyd, Ilene Qua, MD      . tamsulosin (FLOMAX) capsule 0.8 mg  0.8 mg Oral Daily Charlynne Cousins, MD   0.8 mg at 01/21/20 1042      Review of Systems: 10 systems reviewed and negative except per interval history/subjective  Physical Exam: Vitals:   01/22/20 0540 01/22/20 0801  BP: 133/78 (!) 134/95  Pulse: 75 75  Resp: 20 17  Temp: 98.5 F (36.9 C) 98 F (36.7 C)  SpO2: 98% 96%   No intake/output data recorded.  Intake/Output Summary (  Last 24 hours) at 01/22/2020 1135 Last data filed at 01/22/2020 0530 Gross per 24 hour  Intake --  Output 400 ml  Net -400 ml   Constitutional: Chronically ill-appearing, no obvious distress ENMT: ears and nose without scars or lesions, MMM CV: normal rate, 3+ pitting edema in the bilateral lower extremities Respiratory: clear to auscultation, mild increased work of breathing Gastrointestinal: Distention, bowel sounds present, no palpable masses Skin: Venous stasis changes on the bilateral lower extremities, no visible lesions or rashes Psych: alert, judgement/insight appropriate, appropriate mood and affect   Test Results I personally reviewed new and old clinical labs and radiology tests Lab Results  Component Value Date   NA 138 01/22/2020   K 3.0 (L) 01/22/2020   CL 110 01/22/2020   CO2 16 (L) 01/22/2020   BUN 15 01/22/2020   CREATININE 1.70 (H) 01/22/2020   CALCIUM 8.7 (L) 01/22/2020   ALBUMIN 2.7 (L) 01/22/2020   PHOS 2.0 (L) 01/22/2020

## 2020-01-22 NOTE — Progress Notes (Signed)
Patient refused insertion of blakemore tube. Patient encouraged and teaching initiated. Patient stated that, " I want it  done tomorrow". Will continue to encourage and monitor.

## 2020-01-23 ENCOUNTER — Inpatient Hospital Stay (HOSPITAL_COMMUNITY): Payer: Medicare HMO

## 2020-01-23 ENCOUNTER — Encounter (HOSPITAL_COMMUNITY): Payer: Self-pay | Admitting: Gastroenterology

## 2020-01-23 DIAGNOSIS — R140 Abdominal distension (gaseous): Secondary | ICD-10-CM | POA: Diagnosis not present

## 2020-01-23 DIAGNOSIS — E876 Hypokalemia: Secondary | ICD-10-CM | POA: Diagnosis not present

## 2020-01-23 DIAGNOSIS — R52 Pain, unspecified: Secondary | ICD-10-CM | POA: Diagnosis not present

## 2020-01-23 DIAGNOSIS — N179 Acute kidney failure, unspecified: Secondary | ICD-10-CM | POA: Diagnosis not present

## 2020-01-23 LAB — BASIC METABOLIC PANEL
Anion gap: 10 (ref 5–15)
BUN: 21 mg/dL — ABNORMAL HIGH (ref 6–20)
CO2: 16 mmol/L — ABNORMAL LOW (ref 22–32)
Calcium: 8.6 mg/dL — ABNORMAL LOW (ref 8.9–10.3)
Chloride: 115 mmol/L — ABNORMAL HIGH (ref 98–111)
Creatinine, Ser: 1.43 mg/dL — ABNORMAL HIGH (ref 0.61–1.24)
GFR, Estimated: 57 mL/min — ABNORMAL LOW (ref >60)
Glucose, Bld: 118 mg/dL — ABNORMAL HIGH (ref 70–99)
Potassium: 2.9 mmol/L — ABNORMAL LOW (ref 3.5–5.1)
Sodium: 141 mmol/L (ref 135–145)

## 2020-01-23 LAB — RENAL FUNCTION PANEL
Albumin: 2.8 g/dL — ABNORMAL LOW (ref 3.5–5.0)
Anion gap: 12 (ref 5–15)
BUN: 20 mg/dL (ref 6–20)
CO2: 16 mmol/L — ABNORMAL LOW (ref 22–32)
Calcium: 9 mg/dL (ref 8.9–10.3)
Chloride: 113 mmol/L — ABNORMAL HIGH (ref 98–111)
Creatinine, Ser: 1.7 mg/dL — ABNORMAL HIGH (ref 0.61–1.24)
GFR, Estimated: 46 mL/min — ABNORMAL LOW (ref >60)
Glucose, Bld: 126 mg/dL — ABNORMAL HIGH (ref 70–99)
Phosphorus: 2.5 mg/dL (ref 2.5–4.6)
Potassium: 2.7 mmol/L — CL (ref 3.5–5.1)
Sodium: 141 mmol/L (ref 135–145)

## 2020-01-23 LAB — URINALYSIS, ROUTINE W REFLEX MICROSCOPIC
Bilirubin Urine: NEGATIVE
Glucose, UA: 150 mg/dL — AB
Ketones, ur: NEGATIVE mg/dL
Leukocytes,Ua: NEGATIVE
Nitrite: NEGATIVE
Protein, ur: 100 mg/dL — AB
RBC / HPF: >50 RBC/hpf — ABNORMAL HIGH (ref 0–5)
Specific Gravity, Urine: 1.023 (ref 1.005–1.030)
WBC, UA: >50 WBC/hpf — ABNORMAL HIGH (ref 0–5)
pH: 5 (ref 5.0–8.0)

## 2020-01-23 LAB — SODIUM, URINE, RANDOM: Sodium, Ur: <10 mmol/L

## 2020-01-23 LAB — GLUCOSE, CAPILLARY
Glucose-Capillary: 118 mg/dL — ABNORMAL HIGH (ref 70–99)
Glucose-Capillary: 113 mg/dL — ABNORMAL HIGH (ref 70–99)
Glucose-Capillary: 149 mg/dL — ABNORMAL HIGH (ref 70–99)
Glucose-Capillary: 97 mg/dL (ref 70–99)
Glucose-Capillary: 98 mg/dL (ref 70–99)

## 2020-01-23 MED ORDER — POTASSIUM CHLORIDE 10 MEQ/100ML IV SOLN
10.0000 meq | INTRAVENOUS | Status: AC
  Administered 2020-01-23 (×5): 10 meq via INTRAVENOUS
  Filled 2020-01-23 (×3): qty 100

## 2020-01-23 MED ORDER — POTASSIUM CHLORIDE IN NACL 40-0.9 MEQ/L-% IV SOLN
INTRAVENOUS | Status: AC
  Filled 2020-01-23 (×2): qty 1000

## 2020-01-23 MED ORDER — POTASSIUM CHLORIDE 20 MEQ PO PACK
80.0000 meq | PACK | Freq: Three times a day (TID) | ORAL | Status: DC
  Administered 2020-01-23: 80 meq via ORAL
  Filled 2020-01-23: qty 4

## 2020-01-23 MED ORDER — PANTOPRAZOLE SODIUM 40 MG PO TBEC
40.0000 mg | DELAYED_RELEASE_TABLET | Freq: Every day | ORAL | Status: DC
  Filled 2020-01-23: qty 1

## 2020-01-23 MED ORDER — POTASSIUM CHLORIDE CRYS ER 20 MEQ PO TBCR: 80.0000 meq | EXTENDED_RELEASE_TABLET | Freq: Three times a day (TID) | ORAL | Status: DC

## 2020-01-23 MED ORDER — MAGNESIUM SULFATE 2 GM/50ML IV SOLN
2.0000 g | Freq: Once | INTRAVENOUS | Status: AC
  Administered 2020-01-23: 2 g via INTRAVENOUS
  Filled 2020-01-23: qty 50

## 2020-01-23 NOTE — Progress Notes (Signed)
Subjective: Persistent abdominal distention. Minimal flatus.  Objective: Vital signs in last 24 hours: Temp:  [97.6 F (36.4 C)-98.1 F (36.7 C)] 98.1 F (36.7 C) (11/28 1459) Pulse Rate:  [77-86] 82 (11/28 1459) Resp:  [16-18] 17 (11/28 1459) BP: (154-159)/(90-98) 155/94 (11/28 1459) SpO2:  [96 %-100 %] 96 % (11/28 1459) Weight:  [389 kg] 115 kg (11/28 0500) Weight change:  Last BM Date: 01/22/20  PE: GEN:  NAD ABD:  Distended, tympanic, mild tenderness without peritonitis  Lab Results: CBC    Component Value Date/Time   WBC 4.3 01/19/2020 0205   RBC 4.05 (L) 01/19/2020 0205   HGB 11.2 (L) 01/19/2020 1126   HGB 13.9 12/31/2013 1336   HCT 33.0 (L) 01/19/2020 1126   HCT 43.0 12/31/2013 1336   PLT 315 01/19/2020 0205   PLT 184 12/31/2013 1336   MCV 82.7 01/19/2020 0205   MCV 81 12/31/2013 1336   MCH 25.7 (L) 01/19/2020 0205   MCHC 31.0 01/19/2020 0205   RDW 15.0 01/19/2020 0205   RDW 14.4 12/31/2013 1336   LYMPHSABS 1.0 01/19/2020 0205   MONOABS 0.7 01/19/2020 0205   EOSABS 0.2 01/19/2020 0205   BASOSABS 0.0 01/19/2020 0205   CMP     Component Value Date/Time   NA 141 01/23/2020 1451   NA 135 (L) 12/31/2013 1336   K 2.9 (L) 01/23/2020 1451   K 3.8 12/31/2013 1336   CL 115 (H) 01/23/2020 1451   CL 101 12/31/2013 1336   CO2 16 (L) 01/23/2020 1451   CO2 25 12/31/2013 1336   GLUCOSE 118 (H) 01/23/2020 1451   GLUCOSE 249 (H) 12/31/2013 1336   BUN 21 (H) 01/23/2020 1451   BUN 8 12/31/2013 1336   CREATININE 1.43 (H) 01/23/2020 1451   CREATININE 0.88 12/31/2013 1336   CALCIUM 8.6 (L) 01/23/2020 1451   CALCIUM 8.6 12/31/2013 1336   PROT 5.7 (L) 01/18/2020 0517   PROT 7.8 12/31/2013 1336   ALBUMIN 2.8 (L) 01/23/2020 0310   ALBUMIN 3.4 12/31/2013 1336   AST 21 01/18/2020 0517   AST 23 12/31/2013 1336   ALT 16 01/18/2020 0517   ALT 20 12/31/2013 1336   ALKPHOS 68 01/18/2020 0517   ALKPHOS 76 12/31/2013 1336   BILITOT 1.1 01/18/2020 0517   BILITOT 0.6  12/31/2013 1336   GFRNONAA 57 (L) 01/23/2020 1451   GFRNONAA >60 12/31/2013 1336   GFRAA >60 01/29/2018 1705   GFRAA >60 12/31/2013 1336   Assessment:  1.  Abdominal distention with suspected generalized ileus.  No obvious obstruction on colonoscopy or imaging. 2.  Profound hypokalemia. 3.  Bipolar disorder. 4.  Decreased mobility.  Plan:  1.  Patient had well over 1000 cc's of semifeculent fluid output from nasogastric tube after it was placed today. 2.  Continue NGT to ILWS. 3.  Avoid/minimize narcotics. 4.  Despite aggressive repletion, hypokalemia remains an issues; so long as patient is hypokalemic, his ileus process will continue. 5.  Mobilize, OOTBC as tolerated. 6.  Recheck abd xray tomorrow; if persists, consider repeat CT scan +/- surgical consultation, to reassess for obstruction (though colonoscopy and xray findings, both large and small bowel dilatation, most consistent with diffuse generalized ileus). 7.  [facility] will follow.   Landry Dyke 01/23/2020, 3:41 PM   Cell [phone] If no answer or after 5 PM call [phone]

## 2020-01-23 NOTE — Progress Notes (Addendum)
[facility] PROGRESS NOTE    Progress Note  [name]  UEA:[id number] DOB: [birth year] DOA: 12/31/2019 PCP: [name], NP     Brief Narrative:   [name] is an 59 y.o. male past medical history significant for essential hypertension, poorly controlled diabetes mellitus, chronic leukopenia CVA history of substance abuse presents secondary to body ache with evidence of mild rhabdomyolysis and fluid overloaded, found to be in nephrotic syndrome and acute kidney injury started on IV diuresis neurology was consulted subsequently neurology signed off the patient developed abdominal distention with ileus and hypokalemia he intermittently refused treatment with potassium supplementation has remove IV lines and NG tube surgery was consulted for ileus and appreciate assistance.  Palliative care has been consulted for goals of care discussion.  Assessment/Plan:   Nephrotic syndrome: Patient was started on a Lasix drip subsequently switched to oral Lasix by nephrology, he diuresed well nephrology signed off on 01/16/2020. Lasix was discontinued 01/17/2020 because of severe hyperkalemia. In the chart he is negative about 14 L since admission, today he is 116 kg, which is significantly improved compare from admission but is trending up from several days ago,he still appears severely fluid overloaded. He is currently not on Lasix, further Lasix per nephrology once his potassium is improved.  Ileus: CT scan of the abdomen with contrast suggest early partial small bowel obstruction. GI was consulted to perform a flex sig with decompression of his colon. His abdominal x-rays look slightly improved from previous days. Further management per GI. He is currently off narcotics we will try to avoid narcotics. Patient refuses NG tube again last night. Try to keep potassium greater than 4 magnesium greater than 2 continue replete potassium orally and IV. Abdominal ultrasound shows no  ascites. Repeat an abdominal x-ray is pending this morning.  Acute kidney injury on chronic kidney disease stage III: In the setting of nephrotic syndrome and ACE inhibitor use. With a baseline creatinine of 1.1 benazepril held on admission, his Lasix had to be held due to severe hypokalemia Creatinine is significantly worse today we will go ahead and consult nephrology, will start on gentle IV fluid hydration with potassium supplementation. Urine should be urinary sodium to monitor his renal perfusion continue to monitor strict I's and O's  New metabolic acidosis normal anion gap: Further management per renal.  Resume renal disease.  Severe hypokalemia: Potassium is slowly improving, today is 3.0 we will continue oral repletion as well as IV repletion. Nephrology was consulted recommended to start on lactone to help with potassium still low today continue to replete orally. With potassium supplementation will and today further management per nephrology. She has refused his potassium several times yesterday he refused it twice I try to explain to him the risk and benefits of doing that and how he is interfering in his care by refusing the potassium supplementation.  Hypomagnesemia: Keep magnesium greater than 2. Recheck a magnesium level tomorrow morning.  Intermittent agitation: Psych was consulted they deemed that the patient has capacity to make medical decisions. Palliative care was consulted CODE STATUS was changed to DNR appreciate palliative care assistance.  Chronic diastolic heart failure: He is significantly fluid overloaded, and Lasix had to be held due to severe hypokalemia  Essential hypertension: Blood pressure continues to be slightly elevated IV fluids have been KVO continue atenolol diltiazem and Norvasc. Once potassium is improved can restart him back on his Lasix.  Normocytic anemia: Follow-up with PCP as an outpatient.  Diabetes mellitus type  2: The patient is  currently on Lantus 20 units plus sliding scale leukosis fairly controlled.  Bicuspid aortic valve/moderate aortic stenosis: Follow-up with cardiology as an outpatient, noted.  Urinary retention: Foley placed on11/14/2021 will need outpatient urologic evaluation probably due to BPH.    DVT prophylaxis: lovenox Family Communication:none Status is: Inpatient  Remains inpatient appropriate because:Hemodynamically unstable   Dispo: The patient is from: Home              Anticipated d/c is to: Home              Anticipated d/c date is: > 3 days              Patient currently is not medically stable to d/c.    Code Status:     Code Status Orders  (From admission, onward)         Start     Ordered   01/17/20 1708  Do not attempt resuscitation (DNR)  Continuous       Question Answer Comment  In the event of cardiac or respiratory ARREST Do not call a "code blue"   In the event of cardiac or respiratory ARREST Do not perform Intubation, CPR, defibrillation or ACLS   In the event of cardiac or respiratory ARREST Use medication by any route, position, wound care, and other measures to relive pain and suffering. May use oxygen, suction and manual treatment of airway obstruction as needed for comfort.      01/17/20 1708        Code Status History    Date Active Date Inactive Code Status Order ID Comments User Context   12/31/2019 2311 01/17/2020 1708 Full Code [id number]  Roy Bulls, MD ED   12/31/2017 1739 01/02/2018 1315 Full Code [id number]  [name], Roy Huguenin, MD Inpatient   09/28/2017 1434 10/01/2017 1948 Full Code [id number]  [name], [place], DO ED   07/30/2017 2111 08/04/2017 1746 Full Code [id number]  Rankin, Mercy Moore, NP Inpatient   07/29/2017 2347 07/30/2017 2033 Full Code [id number]  Roy Evangelist, PA-C ED   01/08/2015 2013 01/09/2015 2017 Full Code [id number]  Roy Fredrickson, MD Inpatient   01/04/2015 1915 01/08/2015 2013 Full Code [id number]  Roy Lex, MD Inpatient     12/01/2014 1735 12/02/2014 1242 Full Code [id number]  Roy Chapel, MD ED   08/28/2013 1748 08/31/2013 1627 Full Code [id number]  Roy Boga, NP Inpatient   08/28/2013 1301 08/28/2013 1748 Full Code [id number]  [name]., MD ED   05/10/2011 2141 05/12/2011 1747 Full Code [id number]  [facility]   Advance Care Planning Activity        IV Access:    Peripheral IV   Procedures and diagnostic studies:   US Abdomen Limited  Result Date: 01/22/2020 CLINICAL DATA:  Ascites EXAM: LIMITED ABDOMEN ULTRASOUND FOR ASCITES TECHNIQUE: Limited ultrasound survey for ascites was performed in all four abdominal quadrants. COMPARISON:  CT dated January 16, 2020 FINDINGS: There is no significant ascites in the abdomen. There are dilated loops of small bowel and colon throughout the abdomen, several which appear to contain significant amounts of fluid. IMPRESSION: 1. No significant ascites. 2. Dilated loops of small bowel and colon are again noted. Electronically Signed   By: Constance Holster M.D.   On: 01/22/2020 18:55   DG Abd Portable 1V  Result Date: 01/22/2020 CLINICAL DATA:  Evaluate bowel gas pattern/ileus. EXAM: PORTABLE ABDOMEN - 1 VIEW COMPARISON:  Prior radiograph from 01/21/2020  FINDINGS: Again seen are multiple prominent gas-filled loops of bowel throughout the abdomen and pelvis, including both gaseous distension of small and large bowel. Gas lucency overlying the left upper quadrant felt to reflect gas within the gastric fundus. In comparison with prior radiograph from 1 day earlier, overall appearance is perhaps mildly improved, suggesting improving ileus. No visible pneumoperitoneum or other complication. Visualized lung bases are clear. Visualized osseous structures are unchanged. IMPRESSION: Persistent diffuse gaseous distention of small and large bowel, most suggestive of ileus. Overall, appearance is perhaps mildly improved in comparison with prior radiograph from 1 day  earlier, suggesting mild improvement. Electronically Signed   By: Jeannine [name] M.D.   On: 01/22/2020 07:24     Medical Consultants:    None.  Anti-Infectives:   none  Subjective:    [name] he continues to have watery bowel movement, he relates his abdomen is less tense distended that a few days prior but he cannot tell a difference from yesterday.  He relates he is less painful to.  Objective:    Vitals:   01/22/20 2008 01/23/20 0410 01/23/20 0500 01/23/20 0736  BP: (!) 157/98 (!) 159/95  (!) 154/90  Pulse: 77 86  78  Resp: 17 18  16   Temp: 97.6 F (36.4 C) 97.9 F (36.6 C)  97.9 F (36.6 C)  TempSrc: Oral Oral  Oral  SpO2: 100% 100%  100%  Weight:   115 kg   Height:       SpO2: 100 %   Intake/Output Summary (Last 24 hours) at 01/23/2020 0913 Last data filed at 01/23/2020 0330 Gross per 24 hour  Intake 126.22 ml  Output 600 ml  Net -473.78 ml   Filed Weights   01/20/20 0500 01/21/20 0441 01/23/20 0500  Weight: 115.9 kg 116 kg 115 kg    Exam: General exam: In no acute distress. Respiratory system: Good air movement and clear to auscultation. Cardiovascular system: S1 & S2 heard, RRR. No JVD. Gastrointestinal system: Abdomen is nondistended, soft and nontender.  Extremities: No pedal edema. Skin: No rashes, lesions or ulcers  Data Reviewed:    Labs: Basic Metabolic Panel: Recent Labs  Lab 01/18/20 1254 01/18/20 2030 01/19/20 0205 01/19/20 1126 01/19/20 2050 01/20/20 0121 01/20/20 0121 01/21/20 0453 01/21/20 0453 01/22/20 0206 01/22/20 0206 01/22/20 1526 01/23/20 0310  NA  --   --  141   < >  --  145  --  140  --  138  --  140 141  K 2.5*   < > 2.6*   < >  --  2.5*   < > 2.7*   < > 3.0*   < > 2.5* 2.7*  CL  --   --  109   < >  --  114*  --  111  --  110  --  115* 113*  CO2  --   --  18*  --   --  19*  --  15*  --  16*  --  16* 16*  GLUCOSE  --   --  161*   < >  --  190*  --  147*  --  205*  --  142* 126*  BUN  --   --  26*    < >  --  19  --  14  --  15  --  19 20  CREATININE  --   --  1.52*   < >  --  1.17  --  1.16  --  1.70*  --  1.73* 1.70*  CALCIUM  --   --  8.6*  --   --  8.5*  --  8.6*  --  8.7*  --  8.8* 9.0  MG 2.1  --  2.2  --  2.1  --   --  1.9  --   --   --  1.9  --   PHOS  --   --  2.8  --   --  2.4*  --  1.7*  --  2.0*  --   --  2.5   < > = values in this interval not displayed.   GFR Estimated Creatinine Clearance: 57.4 mL/min (A) (by C-G formula based on SCr of 1.7 mg/dL (H)). Liver Function Tests: Recent Labs  Lab 01/18/20 0517 01/18/20 0517 01/19/20 0205 01/20/20 0121 01/21/20 0453 01/22/20 0206 01/23/20 0310  AST 21  --   --   --   --   --   --   ALT 16  --   --   --   --   --   --   ALKPHOS 68  --   --   --   --   --   --   BILITOT 1.1  --   --   --   --   --   --   PROT 5.7*  --   --   --   --   --   --   ALBUMIN 2.2*   < > 2.4* 2.2* 2.5* 2.7* 2.8*   < > = values in this interval not displayed.   No results for input(s): LIPASE, AMYLASE in the last 168 hours. Recent Labs  Lab 01/18/20 0517  AMMONIA 39*   Coagulation profile No results for input(s): INR, PROTIME in the last 168 hours. COVID-19 Labs  No results for input(s): DDIMER, FERRITIN, LDH, CRP in the last 72 hours.  Lab Results  Component Value Date   SARSCOV2NAA NEGATIVE 01/03/2020   [place] NEGATIVE 12/31/2019   [place] NEGATIVE 12/12/2019    CBC: Recent Labs  Lab 01/18/20 0517 01/19/20 0205 01/19/20 1126  WBC 4.4 4.3  --   NEUTROABS 2.3 2.4  --   HGB 9.7* 10.4* 11.2*  HCT 31.4* 33.5* 33.0*  MCV 82.0 82.7  --   PLT 290 315  --    Cardiac Enzymes: No results for input(s): CKTOTAL, CKMB, CKMBINDEX, TROPONINI in the last 168 hours. BNP (last 3 results) No results for input(s): PROBNP in the last 8760 hours. CBG: Recent Labs  Lab 01/22/20 1607 01/22/20 2007 01/22/20 2314 01/23/20 0409 01/23/20 0736  GLUCAP 119* 114* 109* 118* 113*   D-Dimer: No results for input(s): DDIMER in the  last 72 hours. Hgb A1c: No results for input(s): HGBA1C in the last 72 hours. Lipid Profile: No results for input(s): CHOL, HDL, LDLCALC, TRIG, CHOLHDL, LDLDIRECT in the last 72 hours. Thyroid function studies: No results for input(s): TSH, T4TOTAL, T3FREE, THYROIDAB in the last 72 hours.  Invalid input(s): FREET3 Anemia work up: No results for input(s): VITAMINB12, FOLATE, FERRITIN, TIBC, IRON, RETICCTPCT in the last 72 hours. Sepsis Labs: Recent Labs  Lab 01/18/20 0517 01/19/20 0205  WBC 4.4 4.3   Microbiology Recent Results (from the past 240 hour(s))  Culture, Urine     Status: None   Collection Time: 01/16/20  4:49 PM   Specimen: Urine, Random  Result Value Ref Range Status   Specimen Description URINE, RANDOM  Final   Special Requests NONE  Final   Culture   Final    NO GROWTH Performed at [facility], [place] [address], [place], [place] [address]    Report Status 01/17/2020 FINAL  Final     Medications:   . amLODipine  5 mg Oral Daily  . bisacodyl  10 mg Rectal Q1200  . Chlorhexidine Gluconate Cloth  6 each Topical Daily  . enoxaparin (LOVENOX) injection  55 mg Subcutaneous Daily  . insulin aspart  0-5 Units Subcutaneous QHS  . insulin aspart  0-9 Units Subcutaneous TID WC  . insulin glargine  10 Units Subcutaneous Daily  . pantoprazole  40 mg Oral Daily  . potassium chloride  40 mEq Oral TID  . sodium chloride flush  3 mL Intravenous Q12H  . sodium chloride flush  3 mL Intravenous Q12H  . spironolactone  50 mg Oral Daily  . tamsulosin  0.8 mg Oral Daily   Continuous Infusions: . sodium chloride Stopped (01/20/20 1900)  . 0.9 % NaCl with KCl 40 mEq / L 75 mL/hr at 01/22/20 1418      LOS: 19 days   Charlynne Cousins  [facility]  01/23/2020, 9:13 AM

## 2020-01-23 NOTE — Plan of Care (Signed)
  Problem: Education: Goal: Knowledge of General Education information will improve Description Including pain rating scale, medication(s)/side effects and non-pharmacologic comfort measures Outcome: Progressing   Problem: Elimination: Goal: Will not experience complications related to urinary retention Outcome: Progressing   Problem: Pain Managment: Goal: General experience of comfort will improve Outcome: Progressing   Problem: Safety: Goal: Ability to remain free from injury will improve Outcome: Progressing   Problem: Skin Integrity: Goal: Risk for impaired skin integrity will decrease Outcome: Progressing   

## 2020-01-23 NOTE — Progress Notes (Signed)
Nephrology Follow-Up Consult note   Assessment/Recommendations: [name] is a/an 59 y.o. male with a past medical history significant for CKD, DM2, CVA, substance use disorder, HTN, admitted for volume overload c/b ileus.    AKI on CKD 3: Baseline creatinine around 1.3-1.5 but numbers tend to fluctuate.  Creatinine stable at 1.7 today.  Has Foley in place -Follow-up urinalysis urinalysis -Restart diuretics as able as below -Continue monitor ins and outs -Daily weights -Agree with holding ARB for now -Hold any NSAIDs  Volume status: Extensively volume overloaded.  Thought to be multifactorial with urinary protein losses from diabetic nephropathy contributing.  Diuretics were held with recent ileus.  Likely need to be restarted but need to optimize potassium first -Hypokalemia management as below -Increase spironolactone to 50 mg daily -Restart IV Lasix once potassium is greater than 3.7 -Monitor electrolytes twice daily while on Lasix  Hypokalemia: Recurrent issue thought to be related to diuretics but rather expensive.  Likely total body potassium depletion with some GI losses as well. -Continue with aggressive oral and IV repletion -Increase spironolactone to 50 mg daily -Can uptitrate as needed to maintain potassium -Agree with magnesium correction as needed  Hypertension: Home regimen of amlodipine, atenolol, diltiazem.  Benazepril held given AKI.  Blood pressure appropriate at this time -Stop atenolol given minimal efficacy -Stop diltiazem given edema and ileus -Increase spironolactone to 50 mg daily -Restart diuretics when able -Consider stopping amlodipine as this can also contribute to swelling  Ileus: Improving per patient.  Continue to correct potassium as needed.  Having bowel movements  Uncontrolled Diabetes Mellitus Type 2 with Hyperglycemia: Management per primary team    Recommendations conveyed to primary service.    [facility] 01/23/2020 10:40 AM  ___________________________________________________________  CC: AKI on CKD  Interval History/Subjective: Changes over the past 24 hours.  Hypokalemia continues to be a significant issue despite aggressive depletion  Medications:  Current Facility-Administered Medications  Medication Dose Route Frequency Provider Last Rate Last Admin  . 0.9 %  sodium chloride infusion  250 mL Intravenous PRN Opyd, Ilene Qua, MD   Stopped at 01/20/20 1900  . 0.9 % NaCl with KCl 40 mEq / L  infusion   Intravenous Continuous Esmond Plants, RPH 75 mL/hr at 01/22/20 1418 New Bag at 01/22/20 1418  . acetaminophen (TYLENOL) tablet 650 mg  650 mg Oral Q6H PRN [name], MD   650 mg at 01/17/20 2040   Or  . acetaminophen (TYLENOL) suppository 650 mg  650 mg Rectal Q6H PRN Opyd, Ilene Qua, MD      . alum & mag hydroxide-simeth (MAALOX/MYLANTA) 200-200-20 MG/5ML suspension 30 mL  30 mL Oral Q4H PRN Aline August, MD   30 mL at 01/16/20 0607  . amLODipine (NORVASC) tablet 5 mg  5 mg Oral Daily Charlynne Cousins, MD   5 mg at 01/23/20 1023  . bisacodyl (DULCOLAX) suppository 10 mg  10 mg Rectal Q1200 Arta Silence, MD      . Chlorhexidine Gluconate Cloth 2 % PADS 6 each  6 each Topical Daily Mariel Aloe, MD   6 each at 01/23/20 1030  . enoxaparin (LOVENOX) injection 55 mg  55 mg Subcutaneous Daily von Dohlen, Haley B, RPH   55 mg at 01/23/20 1028  . haloperidol lactate (HALDOL) injection 2-5 mg  2-5 mg Intravenous Q6H PRN Aline August, MD   5 mg at 01/21/20 0243  . hydrALAZINE (APRESOLINE) tablet 50 mg  50  mg Oral Q6H PRN [name], MD   50 mg at 01/21/20 0019  . insulin aspart (novoLOG) injection 0-5 Units  0-5 Units Subcutaneous QHS Opyd, Ilene Qua, MD   2 Units at 01/01/20 2231  . insulin aspart (novoLOG) injection 0-9 Units  0-9 Units Subcutaneous TID WC Opyd, Ilene Qua, MD   2 Units at 01/22/20 1300  . insulin glargine (LANTUS) injection 10 Units  10 Units  Subcutaneous Daily Aline August, MD   10 Units at 01/23/20 1027  . lip balm (CARMEX) ointment 1 application  1 application Topical PRN Alekh, Kshitiz, MD      . magnesium sulfate IVPB 2 g 50 mL  2 g Intravenous Once Aileen Fass, Tammi Klippel, MD      . nitroGLYCERIN (NITROSTAT) SL tablet 0.4 mg  0.4 mg Sublingual Q5 min PRN Aline August, MD      . ondansetron (ZOFRAN) tablet 4 mg  4 mg Oral Q6H PRN Mariel Aloe, MD   4 mg at 01/08/20 1036   Or  . ondansetron (ZOFRAN) injection 4 mg  4 mg Intravenous Q6H PRN Mariel Aloe, MD   4 mg at 01/09/20 0303  . potassium chloride (KLOR-CON) packet 80 mEq  80 mEq Oral TID Wilson Singer I, RPH   80 mEq at 01/23/20 1039  . potassium chloride 10 mEq in 100 mL IVPB  10 mEq Intravenous Q1 Hr x 5 Charlynne Cousins, MD 70 mL/hr at 01/23/20 1037 10 mEq at 01/23/20 1037  . sodium chloride flush (NS) 0.9 % injection 3 mL  3 mL Intravenous Q12H Opyd, Ilene Qua, MD   3 mL at 01/22/20 1429  . sodium chloride flush (NS) 0.9 % injection 3 mL  3 mL Intravenous Q12H Opyd, Ilene Qua, MD   3 mL at 01/23/20 1021  . sodium chloride flush (NS) 0.9 % injection 3 mL  3 mL Intravenous PRN Opyd, Ilene Qua, MD      . spironolactone (ALDACTONE) tablet 50 mg  50 mg Oral Daily Reesa Chew, MD   50 mg at 01/23/20 1023  . tamsulosin (FLOMAX) capsule 0.8 mg  0.8 mg Oral Daily Charlynne Cousins, MD   0.8 mg at 01/23/20 1023      Review of Systems: 10 systems reviewed and negative except per interval history/subjective  Physical Exam: Vitals:   01/23/20 0410 01/23/20 0736  BP: (!) 159/95 (!) 154/90  Pulse: 86 78  Resp: 18 16  Temp: 97.9 F (36.6 C) 97.9 F (36.6 C)  SpO2: 100% 100%   Total I/O In: 3 [I.V.:3] Out: -   Intake/Output Summary (Last 24 hours) at 01/23/2020 1040 Last data filed at 01/23/2020 1021 Gross per 24 hour  Intake 129.22 ml  Output 600 ml  Net -470.78 ml   Constitutional: Chronically ill-appearing, no obvious distress ENMT: ears  and nose without scars or lesions, MMM CV: normal rate, 3+ pitting edema in the bilateral lower extremities Respiratory: Mild increased work of breathing, bilateral chest rise Gastrointestinal: Distention, bowel sounds present, no palpable masses Skin: Venous stasis changes on the bilateral lower extremities, no visible lesions or rashes   Test Results I personally reviewed new and old clinical labs and radiology tests Lab Results  Component Value Date   NA 141 01/23/2020   K 2.7 (LL) 01/23/2020   CL 113 (H) 01/23/2020   CO2 16 (L) 01/23/2020   BUN 20 01/23/2020   CREATININE 1.70 (H) 01/23/2020   CALCIUM 9.0 01/23/2020  ALBUMIN 2.8 (L) 01/23/2020   PHOS 2.5 01/23/2020

## 2020-01-23 NOTE — Plan of Care (Signed)

## 2020-01-23 NOTE — Progress Notes (Signed)
NG tube placed at 14:30, Pt tolerated well, KUB order to confirm placement, initial output 1000cc, MD notified. Pt placed on NPO per MD order. Pt and wife at bedside updated.

## 2020-01-24 ENCOUNTER — Inpatient Hospital Stay: Payer: Self-pay

## 2020-01-24 ENCOUNTER — Inpatient Hospital Stay (HOSPITAL_COMMUNITY): Payer: Medicare HMO

## 2020-01-24 DIAGNOSIS — R140 Abdominal distension (gaseous): Secondary | ICD-10-CM | POA: Diagnosis not present

## 2020-01-24 DIAGNOSIS — E876 Hypokalemia: Secondary | ICD-10-CM | POA: Diagnosis not present

## 2020-01-24 DIAGNOSIS — R52 Pain, unspecified: Secondary | ICD-10-CM | POA: Diagnosis not present

## 2020-01-24 DIAGNOSIS — N179 Acute kidney failure, unspecified: Secondary | ICD-10-CM | POA: Diagnosis not present

## 2020-01-24 LAB — RENAL FUNCTION PANEL
Albumin: 2.4 g/dL — ABNORMAL LOW (ref 3.5–5.0)
Anion gap: 9 (ref 5–15)
BUN: 19 mg/dL (ref 6–20)
CO2: 16 mmol/L — ABNORMAL LOW (ref 22–32)
Calcium: 8.7 mg/dL — ABNORMAL LOW (ref 8.9–10.3)
Chloride: 118 mmol/L — ABNORMAL HIGH (ref 98–111)
Creatinine, Ser: 1.24 mg/dL (ref 0.61–1.24)
GFR, Estimated: >60 mL/min (ref >60)
Glucose, Bld: 91 mg/dL (ref 70–99)
Phosphorus: 2.7 mg/dL (ref 2.5–4.6)
Potassium: 2.8 mmol/L — ABNORMAL LOW (ref 3.5–5.1)
Sodium: 143 mmol/L (ref 135–145)

## 2020-01-24 LAB — URINALYSIS, COMPLETE (UACMP) WITH MICROSCOPIC
Bilirubin Urine: NEGATIVE
Glucose, UA: 150 mg/dL — AB
Ketones, ur: 5 mg/dL — AB
Leukocytes,Ua: NEGATIVE
Nitrite: NEGATIVE
Protein, ur: >=300 mg/dL — AB
RBC / HPF: >50 RBC/hpf — ABNORMAL HIGH (ref 0–5)
Specific Gravity, Urine: 1.019 (ref 1.005–1.030)
pH: 5 (ref 5.0–8.0)

## 2020-01-24 LAB — BASIC METABOLIC PANEL
Anion gap: 11 (ref 5–15)
BUN: 17 mg/dL (ref 6–20)
CO2: 17 mmol/L — ABNORMAL LOW (ref 22–32)
Calcium: 9.1 mg/dL (ref 8.9–10.3)
Chloride: 117 mmol/L — ABNORMAL HIGH (ref 98–111)
Creatinine, Ser: 1.23 mg/dL (ref 0.61–1.24)
GFR, Estimated: >60 mL/min (ref >60)
Glucose, Bld: 111 mg/dL — ABNORMAL HIGH (ref 70–99)
Potassium: 3.5 mmol/L (ref 3.5–5.1)
Sodium: 145 mmol/L (ref 135–145)

## 2020-01-24 LAB — GLUCOSE, CAPILLARY
Glucose-Capillary: 101 mg/dL — ABNORMAL HIGH (ref 70–99)
Glucose-Capillary: 102 mg/dL — ABNORMAL HIGH (ref 70–99)
Glucose-Capillary: 82 mg/dL (ref 70–99)
Glucose-Capillary: 86 mg/dL (ref 70–99)
Glucose-Capillary: 91 mg/dL (ref 70–99)

## 2020-01-24 LAB — PROTEIN / CREATININE RATIO, URINE
Creatinine, Urine: 154.73 mg/dL
Protein Creatinine Ratio: 6.77 mg/mg{Cre} — ABNORMAL HIGH (ref 0.00–0.15)
Total Protein, Urine: 1047 mg/dL

## 2020-01-24 LAB — MAGNESIUM: Magnesium: 2.2 mg/dL (ref 1.7–2.4)

## 2020-01-24 MED ORDER — POTASSIUM CHLORIDE 20 MEQ/15ML (10%) PO SOLN
60.0000 meq | Freq: Three times a day (TID) | ORAL | Status: AC
  Administered 2020-01-24 (×3): 60 meq via ORAL
  Filled 2020-01-24 (×4): qty 45

## 2020-01-24 MED ORDER — HYDRALAZINE HCL 50 MG PO TABS
100.0000 mg | ORAL_TABLET | Freq: Four times a day (QID) | ORAL | Status: DC | PRN
  Administered 2020-01-24: 100 mg via ORAL
  Filled 2020-01-24: qty 2

## 2020-01-24 MED ORDER — AMLODIPINE BESYLATE 10 MG PO TABS
10.0000 mg | ORAL_TABLET | Freq: Every day | ORAL | Status: DC
  Administered 2020-01-25 – 2020-01-31 (×7): 10 mg via ORAL
  Filled 2020-01-24 (×7): qty 1

## 2020-01-24 MED ORDER — HYDRALAZINE HCL 50 MG PO TABS: 50.0000 mg | ORAL_TABLET | Freq: Four times a day (QID) | ORAL | Status: DC | PRN

## 2020-01-24 MED ORDER — POTASSIUM CHLORIDE 10 MEQ/100ML IV SOLN
10.0000 meq | INTRAVENOUS | Status: AC
  Administered 2020-01-24 (×5): 10 meq via INTRAVENOUS
  Filled 2020-01-24 (×5): qty 100

## 2020-01-24 NOTE — Progress Notes (Signed)
Physical Therapy Treatment Patient Details Name: [name] MRN: [id number] DOB: 03-22-1960 Today's Date: 01/24/2020    History of Present Illness 59 y.o.malewith medical history significant forhypertension, poorly controlled diabetes mellitus, chronic leukopenia, history of CVA, and history of substance abuse presented secondary to body aches with evidence of mild rhabdomyolysis and volume overload, found to have nephrotic syndrome and AKI. Started on IV diuresis. Nephrologywas consulted. NGT placed 11/28.    PT Comments    Pt supine on arrival now with NGT (intact but suction observed to be off when staff arrived and remained off), agreeable to therapy session and with good participation and tolerance for mobility. Pt performed bed mobility with +1-2 modA and sit<>stand from elevated bed height to [place] with +62modA, needing 2 attempts to achieve full upright posture. Pt able to stand ~3 minute and initiate weight shifting at [facility] but unable to fully offload either foot to initiate stepping this session and remains a totalA for scooting toward [place] (standing at [place]). Pt performed seated/supine BLE A/AAROM therex with good tolerance, remains limited due to proximal>distal BLE weakness. Pt continues to benefit from skilled rehab in a post acute setting to maximize functional gains before returning home.  Follow Up Recommendations  SNF     Equipment Recommendations  Hospital bed;Rolling walker with 5" wheels;Other (comment) (mechanical lift, hospital bed (if home))    Recommendations for Other Services       Precautions / Restrictions Precautions Precautions: Fall Precaution Comments: BLE edema Restrictions Weight Bearing Restrictions: No    Mobility  Bed Mobility Overal bed mobility: Needs Assistance Bed Mobility: Rolling;Supine to Sit;Sit to Supine Rolling: Mod assist   Supine to sit: Mod assist;HOB elevated;+2 for physical assistance Sit to supine: Mod assist;+2 for  physical assistance;HOB elevated   General bed mobility comments: pt with heavy use of bed features, needs modA for BLE assist and bed pad assist for hip translation; HOB flat; +19maxA and use of overhead rail for posterior supine scoot (bed partially in trendelenburg)  Transfers Overall transfer level: Needs assistance Equipment used: Ambulation equipment used Transfers: Sit to/from Stand Sit to Stand: Mod assist;+2 physical assistance;From elevated surface Stand pivot transfers: Total assist       General transfer comment: pt stood from elevated bed height to [place] with +27modA, needing 2 attempts to reach upright. Pt scooted up higher toward HOB using Stedy (unable to weight shift/standing at RW 2/2 fatigue)  Ambulation/Gait                 Stairs             Wheelchair Mobility    Modified Rankin (Stroke Patients Only)       Balance Overall balance assessment: Needs assistance Sitting-balance support: Feet supported;Single extremity supported Sitting balance-Leahy Scale: Fair Sitting balance - Comments: pt able to sit EOB with Supervision for ~10 minutes pre/post standing trial at [facility]. some min guard assist initially but mostly Supervision for seated balance   Standing balance support: Bilateral upper extremity supported;During functional activity Standing balance-Leahy Scale: Poor Standing balance comment: +73modA for static standing at [facility], cues for upright posture and able to minimally weight shift by partially raising each heel, unable to take steps this session                            Cognition Arousal/Alertness: Awake/alert Behavior During Therapy: Anxious;Flat affect Overall Cognitive Status: No family/caregiver present to determine baseline cognitive functioning  General Comments: Slightly improved processing of cues this session, although requires increased time to initiate and perform  mobility tasks. Hesitant to stand but agreeable with encouragement      Exercises General Exercises - Lower Extremity Ankle Circles/Pumps: AROM;Strengthening;Both;10 reps;Seated Long Arc Quad: AAROM;Strengthening;Both;10 reps;Seated Heel Slides: AAROM;Strengthening;Both;10 reps;Supine Hip ABduction/ADduction: AAROM;Strengthening;Both;10 reps;Supine Hip Flexion/Marching: AAROM;Strengthening;Both;10 reps;Seated;Limitations Hip Flexion/Marching Limitations: limited ROM 2/2 B hip flexor weakness    General Comments General comments (skin integrity, edema, etc.): pt bed pads changed while standing at [facility]; encouraged pt to attempt pressure offloading and pillow placed behind L hip to offload but pt reports too uncomfortable and also refused to float heels      Pertinent Vitals/Pain Pain Assessment: Faces Faces Pain Scale: Hurts a little bit Pain Location: pt unable to specify Pain Descriptors / Indicators: Grimacing Pain Intervention(s): Monitored during session;Repositioned    Home Living                      Prior Function            PT Goals (current goals can now be found in the care plan section) Acute Rehab PT Goals Patient Stated Goal: to get to EOB PT Goal Formulation: With patient Time For Goal Achievement: 01/19/20 Potential to Achieve Goals: Fair Progress towards PT goals: Progressing toward goals (slow progress)    Frequency    Min 2X/week      PT Plan Current plan remains appropriate    Co-evaluation              AM-PAC PT "6 Clicks" Mobility   Outcome Measure  Help needed turning from your back to your side while in a flat bed without using bedrails?: A Lot Help needed moving from lying on your back to sitting on the side of a flat bed without using bedrails?: A Lot Help needed moving to and from a bed to a chair (including a wheelchair)?: Total Help needed standing up from a chair using your arms (e.g., wheelchair or bedside chair)?:  Total Help needed to walk in hospital room?: Total Help needed climbing 3-5 steps with a railing? : Total 6 Click Score: 8    End of Session Equipment Utilized During Treatment: Gait belt Activity Tolerance: Patient tolerated treatment well Patient left: in bed;with call bell/phone within reach;with bed alarm set (HOB >30 deg) Nurse Communication: Mobility status;Need for lift equipment (need Stedy and +2 assist if nsg assist with transfers) PT Visit Diagnosis: Other abnormalities of gait and mobility (R26.89);Muscle weakness (generalized) (M62.81);Difficulty in walking, not elsewhere classified (R26.2)     Time: 7062-3762 PT Time Calculation (min) (ACUTE ONLY): 32 min  Charges:  $Therapeutic Exercise: 8-22 mins $Therapeutic Activity: 8-22 mins                     Estefanie Cornforth P., PTA Acute Rehabilitation Services Pager: [phone] Office: [place] 01/24/2020, 5:06 PM

## 2020-01-24 NOTE — Progress Notes (Signed)
[facility] PROGRESS NOTE    Progress Note  [name]  ION:[id number] DOB: 21-Oct-1960 DOA: 12/31/2019 PCP: [name], NP     Brief Narrative:   [name] is an 59 y.o. male past medical history significant for essential hypertension, poorly controlled diabetes mellitus, chronic leukopenia CVA history of substance abuse presents secondary to body ache with evidence of mild rhabdomyolysis and fluid overloaded, found to be in nephrotic syndrome and acute kidney injury started on IV diuresis neurology was consulted subsequently neurology signed off the patient developed abdominal distention with ileus and hypokalemia he intermittently refused treatment with potassium supplementation has remove IV lines and NG tube surgery was consulted for ileus and appreciate assistance.  Palliative care has been consulted for goals of care discussion.  Assessment/Plan:   Nephrotic syndrome: Patient was started on a Lasix drip subsequently switched to oral Lasix by nephrology, he diuresed well nephrology signed off on 01/16/2020.  Reconsulted on 01/22/2020. Lasix was discontinued 01/17/2020 because of severe hyperkalemia. Creatinine has improved with IV fluid hydration and abdominal decompression. Further management per nephrology.  Ileus: CT scan of the abdomen with contrast suggest early partial small bowel obstruction. GI was consulted to perform a flex sig with decompression of his colon. NG tube was placed in about 4 L of feculent material came out. We will repeat an abdominal x-ray today. Try to keep narcotics off continue potassium supplementation IV, will clamp the NG tube today for several hours and give him oral potassium per tube.  As long as his potassium is low his ileus will persist. We will go ahead and give him potassium through the NG tube and clamp it for 2 to 3 hours post administration.  Acute kidney injury on chronic kidney disease stage III: In the setting of  nephrotic syndrome and ACE inhibitor use. With a baseline creatinine of 1.1 benazepril held on admission, his Lasix had to be held due to severe hypokalemia Creatinine was 1.7, with IV fluids improved to 1.2 today we will continue IV fluids for now. Her sodium was less than 10. Continue to monitor strict I's and O's and daily weights,  New metabolic acidosis normal anion gap: Further management per renal.  Resume renal disease.  Severe hypokalemia: Potassium is slowly improving, today is 3.0 we will continue oral repletion as well as IV repletion. Replete potassium orally through his NG tube continue IV supplementation recheck a basic metabolic panel tomorrow. If his potassium continues to be low his ileus will persist.  Hypomagnesemia: Keep magnesium greater than 2. Recheck a magnesium level tomorrow morning.  Intermittent agitation: Psych was consulted they deemed that the patient has capacity to make medical decisions. Palliative care was consulted CODE STATUS was changed to DNR appreciate palliative care assistance.  Chronic diastolic heart failure: He is significantly fluid overloaded, and Lasix had to be held due to severe hypokalemia  Essential hypertension: Blood pressure continues to be slightly elevated IV fluids have been KVO continue atenolol diltiazem and Norvasc. Once potassium is improved can restart him back on his Lasix.  Normocytic anemia: Follow-up with PCP as an outpatient.  Diabetes mellitus type 2: He is currently n.p.o. we will go ahead and hold the Lantus continue sliding scale.  Bicuspid aortic valve/moderate aortic stenosis: Follow-up with cardiology as an outpatient, noted.  Urinary retention: Foley placed on11/14/2021 will need outpatient urologic evaluation probably due to BPH.    DVT prophylaxis: lovenox Family Communication:none Status is: Inpatient  Remains inpatient appropriate because:Hemodynamically unstable  Dispo: The patient is  from: Home              Anticipated d/c is to: Home              Anticipated d/c date is: > 3 days              Patient currently is not medically stable to d/c.    Code Status:     Code Status Orders  (From admission, onward)         Start     Ordered   01/17/20 1708  Do not attempt resuscitation (DNR)  Continuous       Question Answer Comment  In the event of cardiac or respiratory ARREST Do not call a "code blue"   In the event of cardiac or respiratory ARREST Do not perform Intubation, CPR, defibrillation or ACLS   In the event of cardiac or respiratory ARREST Use medication by any route, position, wound care, and other measures to relive pain and suffering. May use oxygen, suction and manual treatment of airway obstruction as needed for comfort.      01/17/20 1708        Code Status History    Date Active Date Inactive Code Status Order ID Comments User Context   12/31/2019 2311 01/17/2020 1708 Full Code [id number]  [name], MD ED   12/31/2017 1739 01/02/2018 1315 Full Code [id number]  [name], [name], MD Inpatient   09/28/2017 1434 10/01/2017 1948 Full Code [id number]  [name], Advance, DO ED   07/30/2017 2111 08/04/2017 1746 Full Code [id number]  Rankin, Mercy Moore, NP Inpatient   07/29/2017 2347 07/30/2017 2033 Full Code [id number]  Recardo Evangelist, PA-C ED   01/08/2015 2013 01/09/2015 2017 Full Code [id number]  [name], MD Inpatient   01/04/2015 1915 01/08/2015 2013 Full Code [id number]  [name], MD Inpatient   12/01/2014 1735 12/02/2014 1242 Full Code [id number]  [name], MD ED   08/28/2013 1748 08/31/2013 1627 Full Code [id number]  [name], NP Inpatient   08/28/2013 1301 08/28/2013 1748 Full Code [id number]  [name], MD ED   05/10/2011 2141 05/12/2011 1747 Full Code [id number]  Vertell Limber ED   Advance Care Planning Activity        IV Access:    Peripheral IV   Procedures and diagnostic studies:   DG Abd 1 View  Result  Date: 01/23/2020 CLINICAL DATA:  Status post NG tube placement. EXAM: ABDOMEN - 1 VIEW COMPARISON:  January 23, 2020 FINDINGS: There has been an interval placement of enteric catheter with tip overlying the expected location of gastric cardia. Gaseous distension of the abdomen, incompletely visualized. IMPRESSION: 1. Enteric catheter with tip overlying the expected location of gastric cardia. 2. Gaseous distension of the abdomen, incompletely visualized. Electronically Signed   By: Fidela Salisbury M.D.   On: 01/23/2020 15:47   DG Abd 1 View  Result Date: 01/23/2020 CLINICAL DATA:  Ileus EXAM: ABDOMEN - 1 VIEW COMPARISON:  01/22/2020 FINDINGS: Limited portable semi upright view of the abdomen was obtained. The entirety of the abdomen was not included within the field of view secondary to habitus. There is persistent gaseous distension of both large and small bowel loops within the visualized abdomen. Overall, little interval change from the previous study. No gross free intraperitoneal air. IMPRESSION: Persistent gaseous distension of both large and small bowel loops, not significantly changed from the previous study. Electronically Signed   By: Hart Carwin  Plundo D.O.   On: 01/23/2020 11:04   US Abdomen Limited  Result Date: 01/22/2020 CLINICAL DATA:  Ascites EXAM: LIMITED ABDOMEN ULTRASOUND FOR ASCITES TECHNIQUE: Limited ultrasound survey for ascites was performed in all four abdominal quadrants. COMPARISON:  CT dated January 16, 2020 FINDINGS: There is no significant ascites in the abdomen. There are dilated loops of small bowel and colon throughout the abdomen, several which appear to contain significant amounts of fluid. IMPRESSION: 1. No significant ascites. 2. Dilated loops of small bowel and colon are again noted. Electronically Signed   By: Constance Holster M.D.   On: 01/22/2020 18:55     Medical Consultants:    None.  Anti-Infectives:   none  Subjective:    [name]  complaining that the NG tube is bothering him.  Objective:    Vitals:   01/23/20 1459 01/23/20 1930 01/24/20 0425 01/24/20 0500  BP: (!) 155/94 140/87 (!) 175/93   Pulse: 82 84 82   Resp: 17 15 16    Temp: 98.1 F (36.7 C) 98 F (36.7 C) 98.1 F (36.7 C)   TempSrc: Oral Oral Oral   SpO2: 96% 99% 100%   Weight:    113.9 kg  Height:       SpO2: 100 %   Intake/Output Summary (Last 24 hours) at 01/24/2020 0934 Last data filed at 01/24/2020 0618 Gross per 24 hour  Intake 1489.4 ml  Output 4920 ml  Net -3430.6 ml   Filed Weights   01/21/20 0441 01/23/20 0500 01/24/20 0500  Weight: 116 kg 115 kg 113.9 kg    Exam: General exam: In no acute distress. Respiratory system: Good air movement and clear to auscultation. Cardiovascular system: S1 & S2 heard, RRR. No JVD. Gastrointestinal system: Abdomen is nondistended, soft and nontender.  Extremities: No pedal edema. Skin: No rashes, lesions or ulcers  Data Reviewed:    Labs: Basic Metabolic Panel: Recent Labs  Lab 01/18/20 1254 01/18/20 2030 01/19/20 0205 01/19/20 1126 01/19/20 2050 01/20/20 0121 01/20/20 0121 01/21/20 0453 01/21/20 0453 01/22/20 0206 01/22/20 0206 01/22/20 1526 01/22/20 1526 01/23/20 0310 01/23/20 0310 01/23/20 1451 01/24/20 0330  NA  --   --  141   < >  --  145   < > 140   < > 138  --  140  --  141  --  141 143  K 2.5*   < > 2.6*   < >  --  2.5*   < > 2.7*   < > 3.0*   < > 2.5*   < > 2.7*   < > 2.9* 2.8*  CL  --   --  109   < >  --  114*   < > 111   < > 110  --  115*  --  113*  --  115* 118*  CO2  --   --  18*  --   --  19*   < > 15*   < > 16*  --  16*  --  16*  --  16* 16*  GLUCOSE  --   --  161*   < >  --  190*   < > 147*   < > 205*  --  142*  --  126*  --  118* 91  BUN  --   --  26*   < >  --  19   < > 14   < > 15  --  19  --  20  --  21* 19  CREATININE  --   --  1.52*   < >  --  1.17   < > 1.16   < > 1.70*  --  1.73*  --  1.70*  --  1.43* 1.24  CALCIUM  --   --  8.6*  --   --  8.5*   <  > 8.6*   < > 8.7*  --  8.8*  --  9.0  --  8.6* 8.7*  MG 2.1  --  2.2  --  2.1  --   --  1.9  --   --   --  1.9  --   --   --   --   --   PHOS  --   --  2.8  --   --  2.4*  --  1.7*  --  2.0*  --   --   --  2.5  --   --  2.7   < > = values in this interval not displayed.   GFR Estimated Creatinine Clearance: 78.3 mL/min (by C-G formula based on SCr of 1.24 mg/dL). Liver Function Tests: Recent Labs  Lab 01/18/20 0517 01/19/20 0205 01/20/20 0121 01/21/20 0453 01/22/20 0206 01/23/20 0310 01/24/20 0330  AST 21  --   --   --   --   --   --   ALT 16  --   --   --   --   --   --   ALKPHOS 68  --   --   --   --   --   --   BILITOT 1.1  --   --   --   --   --   --   PROT 5.7*  --   --   --   --   --   --   ALBUMIN 2.2*   < > 2.2* 2.5* 2.7* 2.8* 2.4*   < > = values in this interval not displayed.   No results for input(s): LIPASE, AMYLASE in the last 168 hours. Recent Labs  Lab 01/18/20 0517  AMMONIA 39*   Coagulation profile No results for input(s): INR, PROTIME in the last 168 hours. COVID-19 Labs  No results for input(s): DDIMER, FERRITIN, LDH, CRP in the last 72 hours.  Lab Results  Component Value Date   SARSCOV2NAA NEGATIVE 01/03/2020   [place] NEGATIVE 12/31/2019   [place] NEGATIVE 12/12/2019    CBC: Recent Labs  Lab 01/18/20 0517 01/19/20 0205 01/19/20 1126  WBC 4.4 4.3  --   NEUTROABS 2.3 2.4  --   HGB 9.7* 10.4* 11.2*  HCT 31.4* 33.5* 33.0*  MCV 82.0 82.7  --   PLT 290 315  --    Cardiac Enzymes: No results for input(s): CKTOTAL, CKMB, CKMBINDEX, TROPONINI in the last 168 hours. BNP (last 3 results) No results for input(s): PROBNP in the last 8760 hours. CBG: Recent Labs  Lab 01/23/20 1119 01/23/20 1604 01/23/20 2036 01/24/20 0021 01/24/20 0420  GLUCAP 149* 97 98 91 82   D-Dimer: No results for input(s): DDIMER in the last 72 hours. Hgb A1c: No results for input(s): HGBA1C in the last 72 hours. Lipid Profile: No results for input(s):  CHOL, HDL, LDLCALC, TRIG, CHOLHDL, LDLDIRECT in the last 72 hours. Thyroid function studies: No results for input(s): TSH, T4TOTAL, T3FREE, THYROIDAB in the last 72 hours.  Invalid input(s): FREET3 Anemia work up: No results for input(s): VITAMINB12, FOLATE, FERRITIN,  TIBC, IRON, RETICCTPCT in the last 72 hours. Sepsis Labs: Recent Labs  Lab 01/18/20 0517 01/19/20 0205  WBC 4.4 4.3   Microbiology Recent Results (from the past 240 hour(s))  Culture, Urine     Status: None   Collection Time: 01/16/20  4:49 PM   Specimen: Urine, Random  Result Value Ref Range Status   Specimen Description URINE, RANDOM  Final   Special Requests NONE  Final   Culture   Final    NO GROWTH Performed at [facility], [address], [place], [place] [address]    Report Status 01/17/2020 FINAL  Final     Medications:   . amLODipine  5 mg Oral Daily  . bisacodyl  10 mg Rectal Q1200  . Chlorhexidine Gluconate Cloth  6 each Topical Daily  . enoxaparin (LOVENOX) injection  55 mg Subcutaneous Daily  . insulin aspart  0-5 Units Subcutaneous QHS  . insulin aspart  0-9 Units Subcutaneous TID WC  . insulin glargine  10 Units Subcutaneous Daily  . sodium chloride flush  3 mL Intravenous Q12H  . sodium chloride flush  3 mL Intravenous Q12H  . spironolactone  50 mg Oral Daily  . tamsulosin  0.8 mg Oral Daily   Continuous Infusions: . sodium chloride Stopped (01/20/20 1900)  . 0.9 % NaCl with KCl 40 mEq / L Stopped (01/23/20 1807)      LOS: 27 days   Charlynne Cousins  [facility]  01/24/2020, 9:34 AM

## 2020-01-24 NOTE — Progress Notes (Signed)
2254 60 mEq of potassium given via NGT. Turned suction off and clamped NGT. Will resume at 0100.

## 2020-01-24 NOTE — Progress Notes (Signed)
Spoke with patient in regards to ordered PICC line placement and explained the role of it. Patient refuses at this time. [name], PICC RN aware. Will continue to monitor.

## 2020-01-24 NOTE — Progress Notes (Signed)
Patient ID: [name], male   DOB: 01-Nov-1960, 59 y.o.   MRN: [id number] S:  No new complaints. O:BP (!) 180/90 (BP Location: Left Arm)   Pulse 88   Temp 98 F (36.7 C)   Resp 18   Ht 5\' 7"  (1.702 m)   Wt 113.9 kg   SpO2 100%   BMI 39.33 kg/m   Intake/Output Summary (Last 24 hours) at 01/24/2020 1606 Last data filed at 01/24/2020 1553 Gross per 24 hour  Intake 2455.73 ml  Output 5220 ml  Net -2764.27 ml   Intake/Output: I/O last 3 completed shifts: In: 1489.4 [P.O.:100; I.V.:881.1; IV Piggyback:508.3] Out: 5809 [Urine:1550; Emesis/NG output:3970]  Intake/Output this shift:  Total I/O In: 1487.7 [P.O.:220; I.V.:600; NG/GT:180; IV Piggyback:487.7] Out: 1300 [Emesis/NG output:1300] Weight change: -1.1 kg Gen: NAD CVS: RRR, no rub Resp: CTA Abd: distended, hypoactive bowel sounds, nontender Ext: 2+ BLE edema  Recent Labs  Lab 01/18/20 0517 01/18/20 1254 01/19/20 0205 01/19/20 1126 01/20/20 0121 01/21/20 0453 01/22/20 0206 01/22/20 1526 01/23/20 0310 01/23/20 1451 01/24/20 0330  NA 142  --  141   < > 145 140 138 140 141 141 143  K 2.2*   < > 2.6*   < > 2.5* 2.7* 3.0* 2.5* 2.7* 2.9* 2.8*  CL 108  --  109   < > 114* 111 110 115* 113* 115* 118*  CO2 23  --  18*  --  19* 15* 16* 16* 16* 16* 16*  GLUCOSE 146*  --  161*   < > 190* 147* 205* 142* 126* 118* 91  BUN 27*  --  26*   < > 19 14 15 19 20  21* 19  CREATININE 1.68*  --  1.52*   < > 1.17 1.16 1.70* 1.73* 1.70* 1.43* 1.24  ALBUMIN 2.2*  --  2.4*  --  2.2* 2.5* 2.7*  --  2.8*  --  2.4*  CALCIUM 8.4*  --  8.6*  --  8.5* 8.6* 8.7* 8.8* 9.0 8.6* 8.7*  PHOS 3.6  --  2.8  --  2.4* 1.7* 2.0*  --  2.5  --  2.7  AST 21  --   --   --   --   --   --   --   --   --   --   ALT 16  --   --   --   --   --   --   --   --   --   --    < > = values in this interval not displayed.   Liver Function Tests: Recent Labs  Lab 01/18/20 0517 01/19/20 0205 01/22/20 0206 01/23/20 0310 01/24/20 0330  AST 21  --   --   --   --    ALT 16  --   --   --   --   ALKPHOS 68  --   --   --   --   BILITOT 1.1  --   --   --   --   PROT 5.7*  --   --   --   --   ALBUMIN 2.2*   < > 2.7* 2.8* 2.4*   < > = values in this interval not displayed.   No results for input(s): LIPASE, AMYLASE in the last 168 hours. Recent Labs  Lab 01/18/20 0517  AMMONIA 39*   CBC: Recent Labs  Lab 01/18/20 0517 01/19/20 0205 01/19/20 1126  WBC 4.4 4.3  --  NEUTROABS 2.3 2.4  --   HGB 9.7* 10.4* 11.2*  HCT 31.4* 33.5* 33.0*  MCV 82.0 82.7  --   PLT 290 315  --    Cardiac Enzymes: No results for input(s): CKTOTAL, CKMB, CKMBINDEX, TROPONINI in the last 168 hours. CBG: Recent Labs  Lab 01/23/20 1604 01/23/20 2036 01/24/20 0021 01/24/20 0420 01/24/20 1220  GLUCAP 97 98 91 82 86    Iron Studies: No results for input(s): IRON, TIBC, TRANSFERRIN, FERRITIN in the last 72 hours. Studies/Results: DG Abd 1 View  Result Date: 01/23/2020 CLINICAL DATA:  Status post NG tube placement. EXAM: ABDOMEN - 1 VIEW COMPARISON:  January 23, 2020 FINDINGS: There has been an interval placement of enteric catheter with tip overlying the expected location of gastric cardia. Gaseous distension of the abdomen, incompletely visualized. IMPRESSION: 1. Enteric catheter with tip overlying the expected location of gastric cardia. 2. Gaseous distension of the abdomen, incompletely visualized. Electronically Signed   By: Fidela Salisbury M.D.   On: 01/23/2020 15:47   DG Abd 1 View  Result Date: 01/23/2020 CLINICAL DATA:  Ileus EXAM: ABDOMEN - 1 VIEW COMPARISON:  01/22/2020 FINDINGS: Limited portable semi upright view of the abdomen was obtained. The entirety of the abdomen was not included within the field of view secondary to habitus. There is persistent gaseous distension of both large and small bowel loops within the visualized abdomen. Overall, little interval change from the previous study. No gross free intraperitoneal air. IMPRESSION: Persistent  gaseous distension of both large and small bowel loops, not significantly changed from the previous study. Electronically Signed   By: Davina Poke D.O.   On: 01/23/2020 11:04   US Abdomen Limited  Result Date: 01/22/2020 CLINICAL DATA:  Ascites EXAM: LIMITED ABDOMEN ULTRASOUND FOR ASCITES TECHNIQUE: Limited ultrasound survey for ascites was performed in all four abdominal quadrants. COMPARISON:  CT dated January 16, 2020 FINDINGS: There is no significant ascites in the abdomen. There are dilated loops of small bowel and colon throughout the abdomen, several which appear to contain significant amounts of fluid. IMPRESSION: 1. No significant ascites. 2. Dilated loops of small bowel and colon are again noted. Electronically Signed   By: Constance Holster M.D.   On: 01/22/2020 18:55   DG Abd Portable 1V  Result Date: 01/24/2020 CLINICAL DATA:  Follow-up ileus. EXAM: PORTABLE ABDOMEN - 1 VIEW COMPARISON:  01/23/2020 FINDINGS: Nasogastric tube remains within the stomach. Diffuse gaseous distention of colon shows no significant change, most consistent with colonic ileus. IMPRESSION: Colonic ileus pattern, without significant change. Electronically Signed   By: Marlaine Hind M.D.   On: 01/24/2020 11:39   [place] EKG SITE RITE  Result Date: 01/24/2020 If Site Rite image not attached, placement could not be confirmed due to current cardiac rhythm.  Derrill Memo ON 01/25/2020] amLODipine  10 mg Oral Daily  . Chlorhexidine Gluconate Cloth  6 each Topical Daily  . enoxaparin (LOVENOX) injection  55 mg Subcutaneous Daily  . insulin aspart  0-5 Units Subcutaneous QHS  . insulin aspart  0-9 Units Subcutaneous TID WC  . potassium chloride  60 mEq Oral TID  . sodium chloride flush  3 mL Intravenous Q12H  . sodium chloride flush  3 mL Intravenous Q12H  . spironolactone  50 mg Oral Daily  . tamsulosin  0.8 mg Oral Daily    BMET    Component Value Date/Time   NA 143 01/24/2020 0330   NA 135 (L) 12/31/2013  1336   K 2.8 (  L) 01/24/2020 0330   K 3.8 12/31/2013 1336   CL 118 (H) 01/24/2020 0330   CL 101 12/31/2013 1336   CO2 16 (L) 01/24/2020 0330   CO2 25 12/31/2013 1336   GLUCOSE 91 01/24/2020 0330   GLUCOSE 249 (H) 12/31/2013 1336   BUN 19 01/24/2020 0330   BUN 8 12/31/2013 1336   CREATININE 1.24 01/24/2020 0330   CREATININE 0.88 12/31/2013 1336   CALCIUM 8.7 (L) 01/24/2020 0330   CALCIUM 8.6 12/31/2013 1336   GFRNONAA >60 01/24/2020 0330   GFRNONAA >60 12/31/2013 1336   GFRAA >60 01/29/2018 1705   GFRAA >60 12/31/2013 1336   CBC    Component Value Date/Time   WBC 4.3 01/19/2020 0205   RBC 4.05 (L) 01/19/2020 0205   HGB 11.2 (L) 01/19/2020 1126   HGB 13.9 12/31/2013 1336   HCT 33.0 (L) 01/19/2020 1126   HCT 43.0 12/31/2013 1336   PLT 315 01/19/2020 0205   PLT 184 12/31/2013 1336   MCV 82.7 01/19/2020 0205   MCV 81 12/31/2013 1336   MCH 25.7 (L) 01/19/2020 0205   MCHC 31.0 01/19/2020 0205   RDW 15.0 01/19/2020 0205   RDW 14.4 12/31/2013 1336   LYMPHSABS 1.0 01/19/2020 0205   MONOABS 0.7 01/19/2020 0205   EOSABS 0.2 01/19/2020 0205   BASOSABS 0.0 01/19/2020 0205     Assessment/Plan:  1. AKI/CKD stage IIIb- due to diuresis in setting of anasarca/nephrotic syndrome related to diabetic nephropathy.  Scr back at baseline and restarted diuretics.   1. Off of ACE/ARB for now and holding diuretics due to hypokalemia. 2. Resume IV lasix when K improves above 3.5. 3. Scr back at baseline and no indication for dialysis at this time. 4. Will continue to follow. 2. Anasarca/nephrotic syndrome as well as 3rd spacing from hypoalbuminemia and ileus. 1. Lasix on hold due to profound hypokalemia. 3. Hypokalemia- continue to replete and increase aldactone to 50 mg daily 4. Ileus vs early partial small bowel obstruction.  GI consulted and now with NGT. 5. Anemia of CKD stage 3- cont to follow H/H. 6. HTN- on hydralazine 100 mg q6, aldactone 50 mg daily, and amlodipine 10 mg daily.   Off of dilt and atenolol but could add labetalol 200 mg tid and follow.  Off benazepril due to AKI.  7. DM type 2 - per primary, uncontrolled.     [name], MD [employer] [phone]

## 2020-01-24 NOTE — Progress Notes (Signed)
[facility] Progress Note  [name] 59 y.o. [birth year]  CC:  Generalized ileus, abdominal distention   Subjective: Patient states he feels better but wants to have his NG tube removed.  States he wants some water, and if he cannot have water, he wants the tube removed.  Denies abdominal pain, nausea or vomiting.  Last bowel movement 11/27.  ROS : Review of Systems  Cardiovascular: Negative for chest pain and palpitations.  Gastrointestinal: Negative for abdominal pain, blood in stool, constipation, diarrhea, heartburn, melena, nausea and vomiting.     Objective: Vital signs in last 24 hours: Vitals:   01/23/20 1930 01/24/20 0425  BP: 140/87 (!) 175/93  Pulse: 84 82  Resp: 15 16  Temp: 98 F (36.7 C) 98.1 F (36.7 C)  SpO2: 99% 100%    Physical Exam:  General:  Alert, cooperative, no distress, NG tube in place  Head:  Normocephalic, without obvious abnormality, atraumatic  Eyes:   Anicteric sclera, EOMs intact  Lungs:   Clear to auscultation bilaterally, respirations unlabored  Heart:  Regular rate and rhythm, S1, S2 normal  Abdomen:    Moderately distended but nontender.  Sluggish bowel sounds.  No guarding or peritoneal signs.  Extremities: Extremities normal, atraumatic, no  edema  Pulses: 2+ and symmetric    Lab Results: Recent Labs    01/22/20 1526 01/22/20 1526 01/23/20 0310 01/23/20 0310 01/23/20 1451 01/24/20 0330  NA 140   < > 141   < > 141 143  K 2.5*   < > 2.7*   < > 2.9* 2.8*  CL 115*   < > 113*   < > 115* 118*  CO2 16*   < > 16*   < > 16* 16*  GLUCOSE 142*   < > 126*   < > 118* 91  BUN 19   < > 20   < > 21* 19  CREATININE 1.73*   < > 1.70*   < > 1.43* 1.24  CALCIUM 8.8*   < > 9.0   < > 8.6* 8.7*  MG 1.9  --   --   --   --   --   PHOS  --   --  2.5  --   --  2.7   < > = values in this interval not displayed.   Recent Labs    01/23/20 0310 01/24/20 0330  ALBUMIN 2.8* 2.4*   No results for input(s): WBC, NEUTROABS, HGB, HCT,  MCV, PLT in the last 72 hours. No results for input(s): LABPROT, INR in the last 72 hours.    Assessment: Generalized ileus, abdominal distention -Patient has had almost 4 L of NG tube output.  Reports improvement in distention. -Abdominal x-ray today showed colonic ileus pattern, without significant change. -He remains hypokalemic, potassium 2.8 today  Plan: Patient adamant that he wants the NG tube removed unless he can have some water, so sips/chips ordered.  Continue NG tube with LI WS for now due to large amount of output.  Replete potassium to maintain > 4-4.5.  Maintain magnesium > 2.  [facility] will follow.  Salley Slaughter PA-C 01/24/2020, 9:38 AM  Contact #  [phone]

## 2020-01-24 NOTE — Progress Notes (Signed)
Nutrition Follow-up  DOCUMENTATION CODES:   Obesity unspecified, Non-severe (moderate) malnutrition in context of acute illness/injury  INTERVENTION:   -RD will follow for diet advancement and add supplements as approrpiate -Recommend initiation of TPN; discussed with MD, who ordered PICC placement  NUTRITION DIAGNOSIS:   Moderate Malnutrition related to acute illness (colonic ileus) as evidenced by energy intake < or equal to 50% for > or equal to 5 days, mild fat depletion, mild muscle depletion, edema.  Ongoing  GOAL:   Patient will meet greater than or equal to 90% of their needs  Unmet  MONITOR:   PO intake, Supplement acceptance, Labs, Skin  REASON FOR ASSESSMENT:   LOS    ASSESSMENT:   59 yo male admitted with mild rhabdomyolysis, nephrotic syndrome, AKI. PMH includes HTN, DM, Bipolar affective disorder, CHF, chronic leukopenia, CVA, substance abuse (cocaine).  11/18- abdominal x-ray reveals persistent diffuse gaseous colonic distention 11/21- s/p repeat abdominal x-ray- reveals colonic pseudo-obstruction or ileus and not a mechanical obstruction, NGT placed, NPO 11/24- s/p colonoscopy for decompression, advanced to clear liquid diet 11/28- NGT placed for decompression  Reviewed I/O's: -3.4 L x 24 hours and -15.1 L since 01/10/20  UOP: 950 ml x 24 hours   NGT output: 4 L x 24 hours  Pt has been NPO/ CLD for 9 days.   Spoke with pt at bedside, who reports feeling better since NGT placed, but verbalized that he wanted it removed. Pt states "they said I had to wait until Wednesday". RD discussed importance of NGT and rationale for NPO status. Provided emotional support.   Pt endorses weight loss, but is unable to tell when wt loss started. He reports his UBW is around 244#, but is unsure when he last weighed that amount. Noted pt with mild muscle depletions, but with moderate edema in lower extremities and severe abdominal distention, which is likely masking  further weight loss as well as fat and muscle depletion.   Case discussed with MD regarding plans to address nutrition plan. RD recommending TPN and confirmed pt will need access. Noted pt has been refusing lines and treatment in the past. Noted order to place PICC.   Labs reviewed: CBGS: 86 (inpatient orders for glycemic control are 0-5 units insulin aspart daily at bedtime and 0-9 units insulin aspart TID with meals).   NUTRITION - FOCUSED PHYSICAL EXAM:    Most Recent Value  Orbital Region Mild depletion  Upper Arm Region No depletion  Thoracic and Lumbar Region No depletion  Buccal Region Mild depletion  Temple Region Mild depletion  Clavicle Bone Region Mild depletion  Clavicle and Acromion Bone Region No depletion  Scapular Bone Region No depletion  Dorsal Hand No depletion  Patellar Region No depletion  Anterior Thigh Region No depletion  Posterior Calf Region No depletion  Edema (RD Assessment) Moderate  Hair Reviewed  Eyes Reviewed  Mouth Reviewed  Skin Reviewed  Nails Reviewed       Diet Order:   Diet Order            Diet NPO time specified Except for: Sips with Meds, Ice Chips  Diet effective now                 EDUCATION NEEDS:   Not appropriate for education at this time  Skin:  Skin Assessment: Reviewed RN Assessment (open wound to penis)  Last BM:  01/23/20  Height:   Ht Readings from Last 1 Encounters:  01/19/20 5\' 7"  (1.702 m)  Weight:   Wt Readings from Last 1 Encounters:  01/24/20 113.9 kg    Ideal Body Weight:  70 kg  BMI:  Body mass index is 39.33 kg/m.  Estimated Nutritional Needs:   Kcal:  2200-2400  Protein:  110-130 gm  Fluid:  2.2-2.4 L    Loistine Chance, RD, LDN, [place] Registered Dietitian II Certified Diabetes Care and Education Specialist Please refer to [facility] for RD and/or RD on-call/weekend/after hours pager

## 2020-01-24 NOTE — Progress Notes (Signed)
Secure chat Dr. [facility] regarding PICC order.  States ok to proceed with PICC

## 2020-01-25 ENCOUNTER — Inpatient Hospital Stay: Payer: Self-pay

## 2020-01-25 DIAGNOSIS — R52 Pain, unspecified: Secondary | ICD-10-CM | POA: Diagnosis not present

## 2020-01-25 DIAGNOSIS — R140 Abdominal distension (gaseous): Secondary | ICD-10-CM | POA: Diagnosis not present

## 2020-01-25 DIAGNOSIS — N179 Acute kidney failure, unspecified: Secondary | ICD-10-CM | POA: Diagnosis not present

## 2020-01-25 DIAGNOSIS — E876 Hypokalemia: Secondary | ICD-10-CM | POA: Diagnosis not present

## 2020-01-25 DIAGNOSIS — E440 Moderate protein-calorie malnutrition: Secondary | ICD-10-CM | POA: Insufficient documentation

## 2020-01-25 LAB — GLUCOSE, CAPILLARY
Glucose-Capillary: 106 mg/dL — ABNORMAL HIGH (ref 70–99)
Glucose-Capillary: 116 mg/dL — ABNORMAL HIGH (ref 70–99)
Glucose-Capillary: 129 mg/dL — ABNORMAL HIGH (ref 70–99)
Glucose-Capillary: 134 mg/dL — ABNORMAL HIGH (ref 70–99)
Glucose-Capillary: 145 mg/dL — ABNORMAL HIGH (ref 70–99)

## 2020-01-25 LAB — RENAL FUNCTION PANEL
Albumin: 2.5 g/dL — ABNORMAL LOW (ref 3.5–5.0)
Anion gap: 13 (ref 5–15)
BUN: 16 mg/dL (ref 6–20)
CO2: 17 mmol/L — ABNORMAL LOW (ref 22–32)
Calcium: 8.8 mg/dL — ABNORMAL LOW (ref 8.9–10.3)
Chloride: 115 mmol/L — ABNORMAL HIGH (ref 98–111)
Creatinine, Ser: 1.13 mg/dL (ref 0.61–1.24)
GFR, Estimated: >60 mL/min (ref >60)
Glucose, Bld: 118 mg/dL — ABNORMAL HIGH (ref 70–99)
Phosphorus: 2.9 mg/dL (ref 2.5–4.6)
Potassium: 3.3 mmol/L — ABNORMAL LOW (ref 3.5–5.1)
Sodium: 145 mmol/L (ref 135–145)

## 2020-01-25 MED ORDER — SODIUM CHLORIDE 0.9% FLUSH: 10.0000 mL | INTRAVENOUS | Status: DC | PRN

## 2020-01-25 MED ORDER — HYDRALAZINE HCL 20 MG/ML IJ SOLN
10.0000 mg | INTRAMUSCULAR | Status: DC
  Administered 2020-01-25 – 2020-01-29 (×21): 10 mg via INTRAVENOUS
  Filled 2020-01-25 (×18): qty 1

## 2020-01-25 MED ORDER — SODIUM CHLORIDE 0.45 % IV SOLN
INTRAVENOUS | Status: AC
  Filled 2020-01-25 (×4): qty 1000

## 2020-01-25 MED ORDER — POTASSIUM PHOSPHATES 15 MMOLE/5ML IV SOLN
20.0000 mmol | Freq: Once | INTRAVENOUS | Status: AC
  Administered 2020-01-25: 20 mmol via INTRAVENOUS
  Filled 2020-01-25: qty 6.67

## 2020-01-25 MED ORDER — POTASSIUM CHLORIDE 20 MEQ/15ML (10%) PO SOLN
60.0000 meq | Freq: Three times a day (TID) | ORAL | Status: DC
  Administered 2020-01-25 (×2): 60 meq via ORAL
  Filled 2020-01-25 (×2): qty 45

## 2020-01-25 MED ORDER — POTASSIUM CHLORIDE 10 MEQ/100ML IV SOLN
10.0000 meq | INTRAVENOUS | Status: DC
  Administered 2020-01-25 (×2): 10 meq via INTRAVENOUS
  Filled 2020-01-25 (×4): qty 100

## 2020-01-25 MED ORDER — TRACE MINERALS CU-MN-SE-ZN 300-55-60-3000 MCG/ML IV SOLN
INTRAVENOUS | Status: AC
  Filled 2020-01-25: qty 312

## 2020-01-25 MED ORDER — INSULIN ASPART 100 UNIT/ML ~~LOC~~ SOLN
0.0000 [IU] | SUBCUTANEOUS | Status: DC
  Administered 2020-01-25 (×2): 2 [IU] via SUBCUTANEOUS
  Administered 2020-01-26: 3 [IU] via SUBCUTANEOUS
  Administered 2020-01-26: 2 [IU] via SUBCUTANEOUS
  Administered 2020-01-26: 3 [IU] via SUBCUTANEOUS
  Administered 2020-01-26: 2 [IU] via SUBCUTANEOUS
  Administered 2020-01-26 – 2020-01-27 (×4): 3 [IU] via SUBCUTANEOUS
  Administered 2020-01-27: 5 [IU] via SUBCUTANEOUS

## 2020-01-25 MED ORDER — SODIUM CHLORIDE 0.9% FLUSH
10.0000 mL | Freq: Two times a day (BID) | INTRAVENOUS | Status: DC
  Administered 2020-01-26 – 2020-01-31 (×6): 10 mL

## 2020-01-25 NOTE — Progress Notes (Signed)
Patient ID: [name], male   DOB: June 05, 1960, 59 y.o.   MRN: [id number] S: No new complaints O:BP (!) 160/82 (BP Location: Left Arm)   Pulse 87   Temp 98.4 F (36.9 C) (Oral)   Resp 18   Ht 5\' 7"  (1.702 m)   Wt 111.6 kg   SpO2 98%   BMI 38.53 kg/m   Intake/Output Summary (Last 24 hours) at 01/25/2020 1157 Last data filed at 01/25/2020 0644 Gross per 24 hour  Intake 2327.69 ml  Output 4850 ml  Net -2522.31 ml   Intake/Output: I/O last 3 completed shifts: In: 3272.7 [P.O.:220; I.V.:1425; NG/GT:1140; IV Piggyback:487.7] Out: 7150 [Urine:2400; Emesis/NG output:4750]  Intake/Output this shift:  No intake/output data recorded. Weight change: -2.3 kg Gen: NAD CVS: RRR, no rub Resp: cta Abd: distended, hypoactive BS Ext: 2+ BLE edema  Recent Labs  Lab 01/19/20 0205 01/19/20 1126 01/20/20 0121 01/20/20 0121 01/21/20 0453 01/21/20 0453 01/22/20 0206 01/22/20 1526 01/23/20 0310 01/23/20 1451 01/24/20 0330 01/24/20 1525 01/25/20 0400  NA 141   < > 145   < > 140   < > 138 140 141 141 143 145 145  K 2.6*   < > 2.5*   < > 2.7*   < > 3.0* 2.5* 2.7* 2.9* 2.8* 3.5 3.3*  CL 109   < > 114*   < > 111   < > 110 115* 113* 115* 118* 117* 115*  CO2 18*  --  19*   < > 15*   < > 16* 16* 16* 16* 16* 17* 17*  GLUCOSE 161*   < > 190*   < > 147*   < > 205* 142* 126* 118* 91 111* 118*  BUN 26*   < > 19   < > 14   < > 15 19 20  21* 19 17 16   CREATININE 1.52*   < > 1.17   < > 1.16   < > 1.70* 1.73* 1.70* 1.43* 1.24 1.23 1.13  ALBUMIN 2.4*  --  2.2*  --  2.5*  --  2.7*  --  2.8*  --  2.4*  --  2.5*  CALCIUM 8.6*  --  8.5*   < > 8.6*   < > 8.7* 8.8* 9.0 8.6* 8.7* 9.1 8.8*  PHOS 2.8  --  2.4*  --  1.7*  --  2.0*  --  2.5  --  2.7  --  2.9   < > = values in this interval not displayed.   Liver Function Tests: Recent Labs  Lab 01/23/20 0310 01/24/20 0330 01/25/20 0400  ALBUMIN 2.8* 2.4* 2.5*   No results for input(s): LIPASE, AMYLASE in the last 168 hours. No results for input(s):  AMMONIA in the last 168 hours. CBC: Recent Labs  Lab 01/19/20 0205 01/19/20 1126  WBC 4.3  --   NEUTROABS 2.4  --   HGB 10.4* 11.2*  HCT 33.5* 33.0*  MCV 82.7  --   PLT 315  --    Cardiac Enzymes: No results for input(s): CKTOTAL, CKMB, CKMBINDEX, TROPONINI in the last 168 hours. CBG: Recent Labs  Lab 01/24/20 1220 01/24/20 1739 01/24/20 2127 01/25/20 0647 01/25/20 1108  GLUCAP 86 102* 101* 106* 116*    Iron Studies: No results for input(s): IRON, TIBC, TRANSFERRIN, FERRITIN in the last 72 hours. Studies/Results: DG Abd 1 View  Result Date: 01/23/2020 CLINICAL DATA:  Status post NG tube placement. EXAM: ABDOMEN - 1 VIEW COMPARISON:  January 23, 2020  FINDINGS: There has been an interval placement of enteric catheter with tip overlying the expected location of gastric cardia. Gaseous distension of the abdomen, incompletely visualized. IMPRESSION: 1. Enteric catheter with tip overlying the expected location of gastric cardia. 2. Gaseous distension of the abdomen, incompletely visualized. Electronically Signed   By: Fidela Salisbury M.D.   On: 01/23/2020 15:47   DG Abd Portable 1V  Result Date: 01/24/2020 CLINICAL DATA:  Follow-up ileus. EXAM: PORTABLE ABDOMEN - 1 VIEW COMPARISON:  01/23/2020 FINDINGS: Nasogastric tube remains within the stomach. Diffuse gaseous distention of colon shows no significant change, most consistent with colonic ileus. IMPRESSION: Colonic ileus pattern, without significant change. Electronically Signed   By: Marlaine Hind M.D.   On: 01/24/2020 11:39   [place] EKG SITE RITE  Result Date: 01/25/2020 If Site Rite image not attached, placement could not be confirmed due to current cardiac rhythm.  [place] EKG SITE RITE  Result Date: 01/24/2020 If Site Rite image not attached, placement could not be confirmed due to current cardiac rhythm.  Marland Kitchen amLODipine  10 mg Oral Daily  . Chlorhexidine Gluconate Cloth  6 each Topical Daily  . enoxaparin (LOVENOX)  injection  55 mg Subcutaneous Daily  . insulin aspart  0-15 Units Subcutaneous Q4H  . potassium chloride  60 mEq Oral TID  . sodium chloride flush  3 mL Intravenous Q12H  . sodium chloride flush  3 mL Intravenous Q12H  . spironolactone  50 mg Oral Daily  . tamsulosin  0.8 mg Oral Daily    BMET    Component Value Date/Time   NA 145 01/25/2020 0400   NA 135 (L) 12/31/2013 1336   K 3.3 (L) 01/25/2020 0400   K 3.8 12/31/2013 1336   CL 115 (H) 01/25/2020 0400   CL 101 12/31/2013 1336   CO2 17 (L) 01/25/2020 0400   CO2 25 12/31/2013 1336   GLUCOSE 118 (H) 01/25/2020 0400   GLUCOSE 249 (H) 12/31/2013 1336   BUN 16 01/25/2020 0400   BUN 8 12/31/2013 1336   CREATININE 1.13 01/25/2020 0400   CREATININE 0.88 12/31/2013 1336   CALCIUM 8.8 (L) 01/25/2020 0400   CALCIUM 8.6 12/31/2013 1336   GFRNONAA >60 01/25/2020 0400   GFRNONAA >60 12/31/2013 1336   GFRAA >60 01/29/2018 1705   GFRAA >60 12/31/2013 1336   CBC    Component Value Date/Time   WBC 4.3 01/19/2020 0205   RBC 4.05 (L) 01/19/2020 0205   HGB 11.2 (L) 01/19/2020 1126   HGB 13.9 12/31/2013 1336   HCT 33.0 (L) 01/19/2020 1126   HCT 43.0 12/31/2013 1336   PLT 315 01/19/2020 0205   PLT 184 12/31/2013 1336   MCV 82.7 01/19/2020 0205   MCV 81 12/31/2013 1336   MCH 25.7 (L) 01/19/2020 0205   MCHC 31.0 01/19/2020 0205   RDW 15.0 01/19/2020 0205   RDW 14.4 12/31/2013 1336   LYMPHSABS 1.0 01/19/2020 0205   MONOABS 0.7 01/19/2020 0205   EOSABS 0.2 01/19/2020 0205   BASOSABS 0.0 01/19/2020 0205     Assessment/Plan:  1. AKI/CKD stage IIIb- due to diuresis in setting of anasarca/nephrotic syndrome related to diabetic nephropathy.  Scr back at baseline and restarted diuretics.   1. Off of ACE/ARB for now. 2. Resume IV lasix when K improves above 3.5. 3. Continue aldactone 50 mg daily 4. Scr back at baseline and no indication for dialysis at this time. 5. Will continue to follow. 2. Anasarca/nephrotic syndrome as well as  3rd spacing from  hypoalbuminemia and ileus. 1. Lasix on hold due to profound hypokalemia. 3. Hypokalemia- continue to replete and continue aldactone 50 mg daily 4. Ileus vs early partial small bowel obstruction.  GI consulted and now with NGT. 5. Anemia of CKD stage 3- cont to follow H/H. 6. HTN- on hydralazine 100 mg q6, aldactone 50 mg daily, and amlodipine 10 mg daily.  Off of dilt and atenolol but could add labetalol 200 mg tid or carvedilol and follow.  Off benazepril due to AKI.  7. DM type 2 - per primary, uncontrolled.     [name], MD [employer] [phone]

## 2020-01-25 NOTE — Progress Notes (Signed)
Notified MD via secure chat if he still needed f/c. Will keep f/c 1-2 more days due to BPH per MD.

## 2020-01-25 NOTE — Progress Notes (Signed)
Peripherally Inserted Central Catheter Placement  The IV Nurse has discussed with the patient and/or persons authorized to consent for the patient, the purpose of this procedure and the potential benefits and risks involved with this procedure.  The benefits include less needle sticks, lab draws from the catheter, and the patient may be discharged home with the catheter. Risks include, but not limited to, infection, bleeding, blood clot (thrombus formation), and puncture of an artery; nerve damage and irregular heartbeat and possibility to perform a PICC exchange if needed/ordered by physician.  Alternatives to this procedure were also discussed.  Bard Power PICC patient education guide, fact sheet on infection prevention and patient information card has been provided to patient /or left at bedside.    PICC Placement Documentation  PICC Double Lumen 85/88/50 PICC Left Basilic 46 cm 0 cm (Active)  Indication for Insertion or Continuance of Line Administration of hyperosmolar/irritating solutions (i.e. TPN, Vancomycin, etc.) 01/25/20 1822  Exposed Catheter (cm) 0 cm 01/25/20 1822  Site Assessment Clean;Dry;Intact 01/25/20 1822  Lumen #1 Status Flushed;Saline locked;Blood return noted 01/25/20 1822  Lumen #2 Status Flushed;Saline locked;Blood return noted 01/25/20 1822  Dressing Type Transparent 01/25/20 1822  Dressing Status Clean;Dry;Intact 01/25/20 1822  Antimicrobial disc in place? Yes 01/25/20 1822  Dressing Intervention New dressing 01/25/20 1822  Dressing Change Due 02/01/20 01/25/20 1822       Gordan Payment 01/25/2020, 6:25 PM

## 2020-01-25 NOTE — Progress Notes (Addendum)
PHARMACY - TOTAL PARENTERAL NUTRITION CONSULT NOTE  Indication: Prolonged ileus  Patient Measurements: Height: 5\' 7"  (170.2 cm) Weight: 111.6 kg (246 lb 0.5 oz) IBW/kg (Calculated) : 66.1 TPN AdjBW (KG): 76.8 Body mass index is 38.53 kg/m. Usual Weight: 111 kg  Assessment:  18 YOM with PMH significant for HTN and DM, presented on 12/31/19 with aches, HTN urgency and hypokalemia.  He then developed diarrhea with abdominal distension and AKI.  Multiple imagings show gaseous dilatation.  Underwent colonoscopy for decompression on 11/24 and NG tubed was replaced on 11/28 with 1L+ of semi-feculent output.  The latest Abd XR on 11/29 showed colonic ileus pattern.  Patient was on a CMD on 11/11-11/12 and 11/16-11/21, otherwise, he was either NPO or on a CLD throughout the hospitalization.  Nutrition has been inadequate and Pharmacy consulted to manage TPN.  Patient reports no recent weight loss.  Glucose / Insulin: hx poorly controlled DM with A1c 10.6% - CBGs low normal since NPO.  No SSI usage, off Lantus 10/d on 11/29.  Anticipate CBGs to trend up with TPN initiation. Electrolytes: Na high normal, K 3.3 (5 runs + 17mEq PO x3 doses ordered, received 268mEq K yesterday, goal >/= 4 for ileus), high CL and low CO2, others WNL Renal: nephrotic syndrome (BL SCr 1.3-1.5) - SCr 1.13, BUN WNL - Lasix held d/t hypoK LFTs / TGs: LFTs / tbili WNL Prealbumin / albumin: albumin 2.5 Intake / Output; MIVF: extensively volume overloaded per Renal 11/28 - UOP 0.5 ml/kg/hr, NG 4L, net -17.4L GI Imaging:  N/A Surgeries / Procedures: N/A  Central access: PICC placed 01/24/20 TPN start date: 01/25/20  Nutritional Goals (per RD rec 01/24/20): 2200-2400 kCal, 110-130gm protein per day  Current Nutrition:  NPO  Plan:  Start concentrated TPN at 72mL/hr at 1800 (goal rate 80 ml/hr) TPN will provide 47g AA, 119g CHO and 26g ILE for a total of 847 kCal, meeting ~35% of needs Electrolytes in TPN: Na 63mEq/L, K  40mEq/L, Ca 24mEq/L, Mag 13mEq/L, Phos 17mmol/L, max acetate for now Add standard MVI and trace elements to TPN Change SSI to moderate Q4H + add 10 units regular insulin in TPN KPhos 20 mmol IV x 1 Standard TPN labs and nursing care orders Monitor CBGs and electrolytes to advance TPN  Dayshon Roback D. Mina Marble, PharmD, BCPS, [place] 01/25/2020, 10:53 AM  =================================  Addendum: MD ordered IL of fluid with 8mEq K Spoke to MD regarding KPhos and 48mEq K in TPN bag D/C the rest of KCL runs per MD (already received 2 runs)  Jb Dulworth D. Mina Marble, PharmD, BCPS, [place] 01/25/2020, 11:29 AM

## 2020-01-25 NOTE — Plan of Care (Signed)
  Problem: Education: Goal: Knowledge of General Education information will improve Description: Including pain rating scale, medication(s)/side effects and non-pharmacologic comfort measures Outcome: Progressing   Problem: Health Behavior/Discharge Planning: Goal: Ability to manage health-related needs will improve Outcome: Progressing   Problem: Clinical Measurements: Goal: Ability to maintain clinical measurements within normal limits will improve Outcome: Progressing Goal: Will remain free from infection Outcome: Progressing Goal: Diagnostic test results will improve Outcome: Progressing Goal: Respiratory complications will improve Outcome: Progressing Goal: Cardiovascular complication will be avoided Outcome: Progressing   Problem: Elimination: Goal: Will not experience complications related to bowel motility Outcome: Progressing Goal: Will not experience complications related to urinary retention Outcome: Progressing   Problem: Safety: Goal: Ability to remain free from injury will improve Outcome: Progressing   Problem: Skin Integrity: Goal: Risk for impaired skin integrity will decrease Outcome: Progressing

## 2020-01-25 NOTE — Plan of Care (Signed)
Patient has NGT placed to ILWS - output is tan and yellow but starting to clear up a little. See progress note regarding potassium. Patient is in no apparent distress and needs have been met as he asks. Will continue to monitor and continue current POC.

## 2020-01-25 NOTE — Progress Notes (Addendum)
[facility] PROGRESS NOTE    Progress Note  [name]  WFU:[id number] DOB: 01/29/1961 DOA: 12/31/2019 PCP: [name], NP     Brief Narrative:   [name] is an 59 y.o. male past medical history significant for essential hypertension, poorly controlled diabetes mellitus, chronic leukopenia CVA history of substance abuse presents secondary to body ache with evidence of mild rhabdomyolysis and fluid overloaded, found to be in nephrotic syndrome and acute kidney injury started on IV diuresis neurology was consulted subsequently neurology signed off the patient developed abdominal distention with ileus and hypokalemia he intermittently refused treatment with potassium supplementation has remove IV lines and NG tube surgery was consulted for ileus and appreciate assistance.  Palliative care has been consulted for goals of care discussion.  Assessment/Plan:   Nephrotic syndrome: Patient was started on a Lasix drip subsequently switched to oral Lasix by nephrology, he diuresed well nephrology signed off on 01/16/2020.  Reconsulted on 01/22/2020. Lasix was discontinued 01/17/2020 because of severe hyperkalemia. Creatinine has improved with IV fluid hydration and abdominal decompression. Further management per nephrology.  Ileus: Surgery evaluated the patient he refused surgery. CT scan of the abdomen with contrast suggest early partial small bowel obstruction. GI was consulted to perform a flex sig with decompression of his colon. Initially he refused the NG tube for like 5 or 6 days, NG tube was placed 01/23/2020, about 4 L of feculent material came out.  He is putting massive amount through the NG tube, he wants it out I have been firm with him and told him that the tube needs to persist in until the ileus have resolved if not the only way out of this is surgical intervention. Try to keep narcotics off continue potassium supplementation IV and oral. As long as his potassium  is low his ileus will persist. We will go ahead and give him potassium through the NG tube and clamp it for 2 to 3 hours post administration. Repeat an abdominal x-ray tomorrow morning.  Acute kidney injury on chronic kidney disease stage III: In the setting of nephrotic syndrome and ACE inhibitor use. With a baseline creatinine of 1.1 benazepril held on admission, his Lasix had to be held due to severe hypokalemia Creatinine was 1.7, with IV fluids improved to 1.2. Her sodium was less than 10.  Although he appears massively fluid overloaded.  Likely due to low albumin. Continue half-normal saline with potassium supplementation.  Recheck a basic metabolic panel in the morning Continue to monitor strict I's and O's and daily weights,  New metabolic acidosis normal anion gap: Further management per renal.  Resume renal disease.  Severe hypokalemia: Potassium is slowly improving, today is 3.6 we will continue oral repletion as well as IV repletion. Replete potassium orally through his NG tube continue IV supplementation recheck a basic metabolic panel tomorrow. If his potassium continues to be low his ileus will persist. He needs to understand this is not the only other way around his surgical intervention which he does not want for move towards comfort care.  Hypomagnesemia: Keep magnesium greater than 2. Recheck a magnesium level tomorrow morning.  Intermittent agitation: Psych was consulted they deemed that the patient has capacity to make medical decisions. Palliative care was consulted CODE STATUS was changed to DNR appreciate palliative care assistance.  Chronic diastolic heart failure: He is significantly fluid overloaded, and Lasix had to be held due to severe hypokalemia  Essential hypertension: Blood pressure continues to be slightly elevated IV  fluids have been KVO continue atenolol diltiazem and Norvasc. Once potassium is improved can restart him back on his  Lasix.  Normocytic anemia: Follow-up with PCP as an outpatient.  Diabetes mellitus type 2: He is currently n.p.o. we will go ahead and hold the Lantus continue sliding scale.  Bicuspid aortic valve/moderate aortic stenosis: Follow-up with cardiology as an outpatient, noted.  Urinary retention: Foley placed on11/14/2021 will need outpatient urologic evaluation probably due to BPH. Foley can probably come out he could have a voiding trial in about 2 to 3 days.  Is been on Flomax since the Foley was inserted.  Nutrition: We will place a PICC line and start TNA. It took me over 30 minutes try to convince the patient but he is agreeable to insert a PICC line and start saline.   DVT prophylaxis: lovenox Family Communication:none Status is: Inpatient  Remains inpatient appropriate because:Hemodynamically unstable   Dispo: The patient is from: Home              Anticipated d/c is to: Home              Anticipated d/c date is: > 3 days              Patient currently is not medically stable to d/c.    Code Status:     Code Status Orders  (From admission, onward)         Start     Ordered   01/17/20 1708  Do not attempt resuscitation (DNR)  Continuous       Question Answer Comment  In the event of cardiac or respiratory ARREST Do not call a "code blue"   In the event of cardiac or respiratory ARREST Do not perform Intubation, CPR, defibrillation or ACLS   In the event of cardiac or respiratory ARREST Use medication by any route, position, wound care, and other measures to relive pain and suffering. May use oxygen, suction and manual treatment of airway obstruction as needed for comfort.      01/17/20 1708        Code Status History    Date Active Date Inactive Code Status Order ID Comments User Context   12/31/2019 2311 01/17/2020 1708 Full Code [id number]  [name], MD ED   12/31/2017 1739 01/02/2018 1315 Full Code [id number]  [name], [name], MD Inpatient    09/28/2017 1434 10/01/2017 1948 Full Code [id number]  [name], [place], DO ED   07/30/2017 2111 08/04/2017 1746 Full Code [id number]  Rankin, Mercy Moore, NP Inpatient   07/29/2017 2347 07/30/2017 2033 Full Code [id number]  Recardo Evangelist, PA-C ED   01/08/2015 2013 01/09/2015 2017 Full Code [id number]  [name], MD Inpatient   01/04/2015 1915 01/08/2015 2013 Full Code [id number]  [name], MD Inpatient   12/01/2014 1735 12/02/2014 1242 Full Code [id number]  [name], MD ED   08/28/2013 1748 08/31/2013 1627 Full Code [id number]  [name], NP Inpatient   08/28/2013 1301 08/28/2013 1748 Full Code [id number]  [name], MD ED   05/10/2011 2141 05/12/2011 1747 Full Code [id number]  Vertell Limber ED   Advance Care Planning Activity        IV Access:    Peripheral IV   Procedures and diagnostic studies:   DG Abd 1 View  Result Date: 01/23/2020 CLINICAL DATA:  Status post NG tube placement. EXAM: ABDOMEN - 1 VIEW COMPARISON:  January 23, 2020 FINDINGS: There has been an interval placement  of enteric catheter with tip overlying the expected location of gastric cardia. Gaseous distension of the abdomen, incompletely visualized. IMPRESSION: 1. Enteric catheter with tip overlying the expected location of gastric cardia. 2. Gaseous distension of the abdomen, incompletely visualized. Electronically Signed   By: Fidela Salisbury M.D.   On: 01/23/2020 15:47   DG Abd 1 View  Result Date: 01/23/2020 CLINICAL DATA:  Ileus EXAM: ABDOMEN - 1 VIEW COMPARISON:  01/22/2020 FINDINGS: Limited portable semi upright view of the abdomen was obtained. The entirety of the abdomen was not included within the field of view secondary to habitus. There is persistent gaseous distension of both large and small bowel loops within the visualized abdomen. Overall, little interval change from the previous study. No gross free intraperitoneal air. IMPRESSION: Persistent gaseous distension of both large and  small bowel loops, not significantly changed from the previous study. Electronically Signed   By: Davina Poke D.O.   On: 01/23/2020 11:04   DG Abd Portable 1V  Result Date: 01/24/2020 CLINICAL DATA:  Follow-up ileus. EXAM: PORTABLE ABDOMEN - 1 VIEW COMPARISON:  01/23/2020 FINDINGS: Nasogastric tube remains within the stomach. Diffuse gaseous distention of colon shows no significant change, most consistent with colonic ileus. IMPRESSION: Colonic ileus pattern, without significant change. Electronically Signed   By: Marlaine Hind M.D.   On: 01/24/2020 11:39   [place] EKG SITE RITE  Result Date: 01/24/2020 If Site Rite image not attached, placement could not be confirmed due to current cardiac rhythm.    Medical Consultants:    None.  Anti-Infectives:   none  Subjective:    [name] keeps complaining that the NG tube is bothering him, he relates he is really scared with everything that is going on, he says and I "I am just very nervous about everything that is going on"  Objective:    Vitals:   01/24/20 2000 01/25/20 0245 01/25/20 0500 01/25/20 0800  BP: (!) 148/98 140/86  (!) 160/82  Pulse: 88 82  87  Resp: 18 16  18   Temp: 99 F (37.2 C) 97.9 F (36.6 C)  98.4 F (36.9 C)  TempSrc: Oral Oral  Oral  SpO2: 100% 99%  98%  Weight:   111.6 kg   Height:       SpO2: 98 %   Intake/Output Summary (Last 24 hours) at 01/25/2020 0849 Last data filed at 01/25/2020 0644 Gross per 24 hour  Intake 2447.69 ml  Output 5450 ml  Net -3002.31 ml   Filed Weights   01/23/20 0500 01/24/20 0500 01/25/20 0500  Weight: 115 kg 113.9 kg 111.6 kg    Exam: General exam: In no acute distress. Respiratory system: Good air movement and clear to auscultation. Cardiovascular system: S1 & S2 heard, RRR. No JVD. Gastrointestinal system: Abdomen is nondistended, soft and nontender.  Extremities: 3+ edema  Data Reviewed:    Labs: Basic Metabolic Panel: Recent Labs  Lab  01/19/20 0205 01/19/20 1126 01/19/20 2050 01/20/20 0121 01/21/20 0453 01/21/20 0453 01/22/20 0206 01/22/20 0206 01/22/20 1526 01/22/20 1526 01/23/20 0310 01/23/20 0310 01/23/20 1451 01/23/20 1451 01/24/20 0330 01/24/20 0330 01/24/20 1525 01/25/20 0400  NA 141   < >  --    < > 140   < > 138   < > 140   < > 141  --  141  --  143  --  145 145  K 2.6*   < >  --    < > 2.7*   < >  3.0*   < > 2.5*   < > 2.7*   < > 2.9*   < > 2.8*   < > 3.5 3.3*  CL 109   < >  --    < > 111   < > 110   < > 115*   < > 113*  --  115*  --  118*  --  117* 115*  CO2 18*  --   --    < > 15*   < > 16*   < > 16*   < > 16*  --  16*  --  16*  --  17* 17*  GLUCOSE 161*   < >  --    < > 147*   < > 205*   < > 142*   < > 126*  --  118*  --  91  --  111* 118*  BUN 26*   < >  --    < > 14   < > 15   < > 19   < > 20  --  21*  --  19  --  17 16  CREATININE 1.52*   < >  --    < > 1.16   < > 1.70*   < > 1.73*   < > 1.70*  --  1.43*  --  1.24  --  1.23 1.13  CALCIUM 8.6*  --   --    < > 8.6*   < > 8.7*   < > 8.8*   < > 9.0  --  8.6*  --  8.7*  --  9.1 8.8*  MG 2.2  --  2.1  --  1.9  --   --   --  1.9  --   --   --   --   --   --   --  2.2  --   PHOS 2.8  --   --    < > 1.7*  --  2.0*  --   --   --  2.5  --   --   --  2.7  --   --  2.9   < > = values in this interval not displayed.   GFR Estimated Creatinine Clearance: 85 mL/min (by C-G formula based on SCr of 1.13 mg/dL). Liver Function Tests: Recent Labs  Lab 01/21/20 0453 01/22/20 0206 01/23/20 0310 01/24/20 0330 01/25/20 0400  ALBUMIN 2.5* 2.7* 2.8* 2.4* 2.5*   No results for input(s): LIPASE, AMYLASE in the last 168 hours. No results for input(s): AMMONIA in the last 168 hours. Coagulation profile No results for input(s): INR, PROTIME in the last 168 hours. COVID-19 Labs  No results for input(s): DDIMER, FERRITIN, LDH, CRP in the last 72 hours.  Lab Results  Component Value Date   SARSCOV2NAA NEGATIVE 01/03/2020   [place] NEGATIVE 12/31/2019    [place] NEGATIVE 12/12/2019    CBC: Recent Labs  Lab 01/19/20 0205 01/19/20 1126  WBC 4.3  --   NEUTROABS 2.4  --   HGB 10.4* 11.2*  HCT 33.5* 33.0*  MCV 82.7  --   PLT 315  --    Cardiac Enzymes: No results for input(s): CKTOTAL, CKMB, CKMBINDEX, TROPONINI in the last 168 hours. BNP (last 3 results) No results for input(s): PROBNP in the last 8760 hours. CBG: Recent Labs  Lab 01/24/20 0420 01/24/20 1220 01/24/20 1739 01/24/20 2127 01/25/20 0647  GLUCAP 82 86 102* 101* 106*  D-Dimer: No results for input(s): DDIMER in the last 72 hours. Hgb A1c: No results for input(s): HGBA1C in the last 72 hours. Lipid Profile: No results for input(s): CHOL, HDL, LDLCALC, TRIG, CHOLHDL, LDLDIRECT in the last 72 hours. Thyroid function studies: No results for input(s): TSH, T4TOTAL, T3FREE, THYROIDAB in the last 72 hours.  Invalid input(s): FREET3 Anemia work up: No results for input(s): VITAMINB12, FOLATE, FERRITIN, TIBC, IRON, RETICCTPCT in the last 72 hours. Sepsis Labs: Recent Labs  Lab 01/19/20 0205  WBC 4.3   Microbiology Recent Results (from the past 240 hour(s))  Culture, Urine     Status: None   Collection Time: 01/16/20  4:49 PM   Specimen: Urine, Random  Result Value Ref Range Status   Specimen Description URINE, RANDOM  Final   Special Requests NONE  Final   Culture   Final    NO GROWTH Performed at [facility], [address], [place], [place] [address]    Report Status 01/17/2020 FINAL  Final     Medications:   . amLODipine  10 mg Oral Daily  . Chlorhexidine Gluconate Cloth  6 each Topical Daily  . enoxaparin (LOVENOX) injection  55 mg Subcutaneous Daily  . insulin aspart  0-5 Units Subcutaneous QHS  . insulin aspart  0-9 Units Subcutaneous TID WC  . sodium chloride flush  3 mL Intravenous Q12H  . sodium chloride flush  3 mL Intravenous Q12H  . spironolactone  50 mg Oral Daily  . tamsulosin  0.8 mg Oral Daily   Continuous  Infusions: . sodium chloride Stopped (01/20/20 1900)  . potassium chloride        LOS: 21 days   Charlynne Cousins  [facility]  01/25/2020, 8:49 AM

## 2020-01-25 NOTE — Progress Notes (Signed)
[facility] Gastroenterology Progress Note  [name] 59 y.o. [birth year]  CC:  Generalized ileus, abdominal distention   Subjective: Patient states he feels a little bit better and less distended.  Reports having a BM yesterday. Denies abdominal pain, nausea, or vomiting.  ROS : Review of Systems  Cardiovascular: Negative for chest pain and palpitations.  Gastrointestinal: Negative for abdominal pain, blood in stool, constipation, diarrhea, heartburn, melena, nausea and vomiting.     Objective: Vital signs in last 24 hours: Vitals:   01/25/20 0245 01/25/20 0800  BP: 140/86 (!) 160/82  Pulse: 82 87  Resp: 16 18  Temp: 97.9 F (36.6 C) 98.4 F (36.9 C)  SpO2: 99% 98%    Physical Exam:  General:  Alert, cooperative, no distress, NG tube in place  Head:  Normocephalic, without obvious abnormality, atraumatic  Eyes:   Anicteric sclera, EOMs intact  Lungs:   Clear to auscultation bilaterally, respirations unlabored  Heart:  Regular rate and rhythm, S1, S2 normal  Abdomen:    Moderately distended but nontender.  Hypoactive bowel sounds.  No guarding or peritoneal signs.  Extremities: Extremities normal, atraumatic, no  edema  Pulses: 2+ and symmetric    Lab Results: Recent Labs    01/22/20 1526 01/23/20 0310 01/24/20 0330 01/24/20 0330 01/24/20 1525 01/25/20 0400  NA 140   < > 143   < > 145 145  K 2.5*   < > 2.8*   < > 3.5 3.3*  CL 115*   < > 118*   < > 117* 115*  CO2 16*   < > 16*   < > 17* 17*  GLUCOSE 142*   < > 91   < > 111* 118*  BUN 19   < > 19   < > 17 16  CREATININE 1.73*   < > 1.24   < > 1.23 1.13  CALCIUM 8.8*   < > 8.7*   < > 9.1 8.8*  MG 1.9  --   --   --  2.2  --   PHOS  --    < > 2.7  --   --  2.9   < > = values in this interval not displayed.   Recent Labs    01/24/20 0330 01/25/20 0400  ALBUMIN 2.4* 2.5*   No results for input(s): WBC, NEUTROABS, HGB, HCT, MCV, PLT in the last 72 hours. No results for input(s): LABPROT, INR in the last 72  hours.    Assessment: Generalized ileus, abdominal distention -Patient has had continued NG tube output (4150cc from 11/29 7PM to 11/30 7AM).  Reports improvement in distention. -Abdominal x-ray 11/29 showed colonic ileus pattern, without significant change. -He remains hypokalemic, potassium improving, 3.3 today -Mg 2.2 yesterday  Plan: Replete potassium to maintain > 4-4.5.  Maintain magnesium > 2.  Continue NGT with LIWS today, will reassess tomorrow.  [facility] will follow.  Salley Slaughter PA-C 01/25/2020, 10:56 AM  Contact #  [phone]

## 2020-01-25 NOTE — Progress Notes (Signed)
Nutrition Follow-up  DOCUMENTATION CODES:   Obesity unspecified, Non-severe (moderate) malnutrition in context of acute illness/injury  INTERVENTION:   -TPN management per pharmacy -Monitor K, Mg, and Phos daily and replete as needed; pt is at high risk for refeeding syndrome -RD will follow for diet advancement and add supplements as appropriate  NUTRITION DIAGNOSIS:   Moderate Malnutrition related to acute illness (colonic ileus) as evidenced by energy intake < or equal to 50% for > or equal to 5 days, mild fat depletion, mild muscle depletion, edema.  Ongoing  GOAL:   Patient will meet greater than or equal to 90% of their needs  Progressing   MONITOR:   PO intake, Supplement acceptance, Labs, Skin  REASON FOR ASSESSMENT:   LOS    ASSESSMENT:   59 yo male admitted with mild rhabdomyolysis, nephrotic syndrome, AKI. PMH includes HTN, DM, Bipolar affective disorder, CHF, chronic leukopenia, CVA, substance abuse (cocaine).  11/18- abdominal x-ray revealspersistent diffuse gaseous colonic distention 11/21- s/p repeat abdominal x-ray- revealscolonic pseudo-obstruction or ileus and not a mechanical obstruction, NGT placed, NPO 11/24- s/p colonoscopy for decompression, advanced to clear liquid diet 11/28- NGT placed for decompression 11/29- PICC placed 11/30 TPN initiated  Reviewed I/O's: -3 L x 24 hours and -17.5 L since 01/11/20  UOP: 1.5 L x 24 hours  NGT output: 4 L x 24 hours  Per GI notes, pt had bowel movement today. Plan to clamp NGT tomorrow.   Pt with inadequate nutrition for the past 10 days (NPO/ clear liquids). PICC placed yesterday. Plan to start TPN today. Per pharmacy note, initiate TPN at 67mL/hr at 1800; regimen to provides 847 kcals and 47 grams protein, which meets 38% of estimated kcals needs and 39% of estimated protein needs. Agree with slow advancement and monitoring of electrolytes, as pt is at high risk for refeeding syndrome.     Medications reviewed and include aldactone.   Labs reviewed: K: 3.3 (on PO supplementation), Phos WDL. CBGS: 106-116 (inpatient orders for glycemic control are 0-15 units insulin aspart every 4 hours).   Diet Order:   Diet Order            Diet NPO time specified Except for: Sips with Meds, Ice Chips  Diet effective now                 EDUCATION NEEDS:   Not appropriate for education at this time  Skin:  Skin Assessment: Reviewed RN Assessment (open wound to penis)  Last BM:  01/23/20  Height:   Ht Readings from Last 1 Encounters:  01/19/20 5\' 7"  (1.702 m)    Weight:   Wt Readings from Last 1 Encounters:  01/25/20 111.6 kg    Ideal Body Weight:  70 kg  BMI:  Body mass index is 38.53 kg/m.  Estimated Nutritional Needs:   Kcal:  1610-9604  Protein:  120-140 grams  Fluid:  > 2 L    Loistine Chance, RD, LDN, [place] Registered Dietitian II Certified Diabetes Care and Education Specialist Please refer to [facility] for RD and/or RD on-call/weekend/after hours pager

## 2020-01-26 ENCOUNTER — Inpatient Hospital Stay (HOSPITAL_COMMUNITY): Payer: Medicare HMO

## 2020-01-26 DIAGNOSIS — N189 Chronic kidney disease, unspecified: Secondary | ICD-10-CM

## 2020-01-26 DIAGNOSIS — D631 Anemia in chronic kidney disease: Secondary | ICD-10-CM

## 2020-01-26 LAB — COMPREHENSIVE METABOLIC PANEL
ALT: 13 U/L (ref 0–44)
AST: 12 U/L — ABNORMAL LOW (ref 15–41)
Albumin: 2.4 g/dL — ABNORMAL LOW (ref 3.5–5.0)
Alkaline Phosphatase: 76 U/L (ref 38–126)
Anion gap: 11 (ref 5–15)
BUN: 12 mg/dL (ref 6–20)
CO2: 17 mmol/L — ABNORMAL LOW (ref 22–32)
Calcium: 8.8 mg/dL — ABNORMAL LOW (ref 8.9–10.3)
Chloride: 117 mmol/L — ABNORMAL HIGH (ref 98–111)
Creatinine, Ser: 1.03 mg/dL (ref 0.61–1.24)
GFR, Estimated: >60 mL/min (ref >60)
Glucose, Bld: 142 mg/dL — ABNORMAL HIGH (ref 70–99)
Potassium: 3.2 mmol/L — ABNORMAL LOW (ref 3.5–5.1)
Sodium: 145 mmol/L (ref 135–145)
Total Bilirubin: 0.7 mg/dL (ref 0.3–1.2)
Total Protein: 6.2 g/dL — ABNORMAL LOW (ref 6.5–8.1)

## 2020-01-26 LAB — CBC
HCT: 34.5 % — ABNORMAL LOW (ref 39.0–52.0)
Hemoglobin: 11.1 g/dL — ABNORMAL LOW (ref 13.0–17.0)
MCH: 25.3 pg — ABNORMAL LOW (ref 26.0–34.0)
MCHC: 32.2 g/dL (ref 30.0–36.0)
MCV: 78.6 fL — ABNORMAL LOW (ref 80.0–100.0)
Platelets: 286 10*3/uL (ref 150–400)
RBC: 4.39 MIL/uL (ref 4.22–5.81)
RDW: 16.2 % — ABNORMAL HIGH (ref 11.5–15.5)
WBC: 5.1 10*3/uL (ref 4.0–10.5)
nRBC: 0 % (ref 0.0–0.2)

## 2020-01-26 LAB — DIFFERENTIAL
Abs Immature Granulocytes: 0.04 10*3/uL (ref 0.00–0.07)
Basophils Absolute: 0 10*3/uL (ref 0.0–0.1)
Basophils Relative: 0 %
Eosinophils Absolute: 0.1 10*3/uL (ref 0.0–0.5)
Eosinophils Relative: 2 %
Immature Granulocytes: 1 %
Lymphocytes Relative: 23 %
Lymphs Abs: 1.2 10*3/uL (ref 0.7–4.0)
Monocytes Absolute: 0.6 10*3/uL (ref 0.1–1.0)
Monocytes Relative: 11 %
Neutro Abs: 3.2 10*3/uL (ref 1.7–7.7)
Neutrophils Relative %: 63 %

## 2020-01-26 LAB — GLUCOSE, CAPILLARY
Glucose-Capillary: 138 mg/dL — ABNORMAL HIGH (ref 70–99)
Glucose-Capillary: 164 mg/dL — ABNORMAL HIGH (ref 70–99)
Glucose-Capillary: 180 mg/dL — ABNORMAL HIGH (ref 70–99)
Glucose-Capillary: 179 mg/dL — ABNORMAL HIGH (ref 70–99)

## 2020-01-26 LAB — PHOSPHORUS: Phosphorus: 2.6 mg/dL (ref 2.5–4.6)

## 2020-01-26 LAB — TRIGLYCERIDES: Triglycerides: 71 mg/dL (ref <150)

## 2020-01-26 LAB — MAGNESIUM: Magnesium: 1.8 mg/dL (ref 1.7–2.4)

## 2020-01-26 LAB — PREALBUMIN: Prealbumin: 12.2 mg/dL — ABNORMAL LOW (ref 18–38)

## 2020-01-26 MED ORDER — POTASSIUM CHLORIDE 10 MEQ/50ML IV SOLN
10.0000 meq | INTRAVENOUS | Status: AC
  Administered 2020-01-26 (×4): 10 meq via INTRAVENOUS
  Filled 2020-01-26 (×6): qty 50

## 2020-01-26 MED ORDER — MAGNESIUM SULFATE 2 GM/50ML IV SOLN
2.0000 g | Freq: Once | INTRAVENOUS | Status: AC
  Administered 2020-01-26: 2 g via INTRAVENOUS
  Filled 2020-01-26: qty 50

## 2020-01-26 MED ORDER — POTASSIUM PHOSPHATES 15 MMOLE/5ML IV SOLN
10.0000 mmol | Freq: Once | INTRAVENOUS | Status: AC
  Administered 2020-01-26: 10 mmol via INTRAVENOUS
  Filled 2020-01-26 (×2): qty 3.33

## 2020-01-26 MED ORDER — TRACE MINERALS CU-MN-SE-ZN 300-55-60-3000 MCG/ML IV SOLN
INTRAVENOUS | Status: AC
  Filled 2020-01-26: qty 520

## 2020-01-26 NOTE — Progress Notes (Addendum)
Patient ID: DIAMONTE STAVELY, male   DOB: 27-Mar-1960, 59 y.o.   MRN: [id number] S: He is upset that the NG tube is still in place. O:BP (!) 148/84 (BP Location: Right Arm)   Pulse 92   Temp 98.7 F (37.1 C) (Oral)   Resp 18   Ht 5\' 7"  (1.702 m)   Wt 111.6 kg   SpO2 100%   BMI 38.53 kg/m   Intake/Output Summary (Last 24 hours) at 01/26/2020 1256 Last data filed at 01/26/2020 0300 Gross per 24 hour  Intake 858.29 ml  Output 5000 ml  Net -4141.71 ml   Intake/Output: I/O last 3 completed shifts: In: 1758.3 [I.V.:858.3; NG/GT:900] Out: 67341 [Urine:2950; Emesis/NG output:7100; Stool:1]  Intake/Output this shift:  No intake/output data recorded. Weight change:  Gen: NAD CVS: RRR, no rub Resp: cta Abd: distended, hypoactive BS Ext: 2+ bilateral lower ext edema   Recent Labs  Lab 01/20/20 0121 01/20/20 0121 01/21/20 0453 01/21/20 0453 01/22/20 0206 01/22/20 0206 01/22/20 1526 01/23/20 0310 01/23/20 1451 01/24/20 0330 01/24/20 1525 01/25/20 0400 01/26/20 0205  NA 145   < > 140   < > 138   < > 140 141 141 143 145 145 145  K 2.5*   < > 2.7*   < > 3.0*   < > 2.5* 2.7* 2.9* 2.8* 3.5 3.3* 3.2*  CL 114*   < > 111   < > 110   < > 115* 113* 115* 118* 117* 115* 117*  CO2 19*   < > 15*   < > 16*   < > 16* 16* 16* 16* 17* 17* 17*  GLUCOSE 190*   < > 147*   < > 205*   < > 142* 126* 118* 91 111* 118* 142*  BUN 19   < > 14   < > 15   < > 19 20 21* 19 17 16 12   CREATININE 1.17   < > 1.16   < > 1.70*   < > 1.73* 1.70* 1.43* 1.24 1.23 1.13 1.03  ALBUMIN 2.2*  --  2.5*  --  2.7*  --   --  2.8*  --  2.4*  --  2.5* 2.4*  CALCIUM 8.5*   < > 8.6*   < > 8.7*   < > 8.8* 9.0 8.6* 8.7* 9.1 8.8* 8.8*  PHOS 2.4*  --  1.7*  --  2.0*  --   --  2.5  --  2.7  --  2.9 2.6  AST  --   --   --   --   --   --   --   --   --   --   --   --  12*  ALT  --   --   --   --   --   --   --   --   --   --   --   --  13   < > = values in this interval not displayed.   Liver Function Tests: Recent Labs  Lab  01/24/20 0330 01/25/20 0400 01/26/20 0205  AST  --   --  12*  ALT  --   --  13  ALKPHOS  --   --  76  BILITOT  --   --  0.7  PROT  --   --  6.2*  ALBUMIN 2.4* 2.5* 2.4*   No results for input(s): LIPASE, AMYLASE in the last 168 hours. No results  for input(s): AMMONIA in the last 168 hours. CBC: Recent Labs  Lab 01/26/20 0205  WBC 5.1  NEUTROABS 3.2  HGB 11.1*  HCT 34.5*  MCV 78.6*  PLT 286   Cardiac Enzymes: No results for input(s): CKTOTAL, CKMB, CKMBINDEX, TROPONINI in the last 168 hours. CBG: Recent Labs  Lab 01/25/20 1626 01/25/20 1938 01/25/20 2330 01/26/20 0339 01/26/20 1151  GLUCAP 129* 145* 134* 138* 164*    Iron Studies: No results for input(s): IRON, TIBC, TRANSFERRIN, FERRITIN in the last 72 hours. Studies/Results: DG Abd 1 View  Result Date: 01/26/2020 CLINICAL DATA:  Ileus EXAM: ABDOMEN - 1 VIEW COMPARISON:  January 24, 2020 FINDINGS: Nasogastric tube tip and side port are in the stomach. There is persistent bowel dilatation without air-fluid levels. No free air evident. Visualized left lung base clear. IMPRESSION: Persistent bowel dilatation, likely ileus. No free air evident. Nasogastric tube tip and side port in stomach. Electronically Signed   By: Lowella Grip III M.D.   On: 01/26/2020 08:04   [place] EKG SITE RITE  Result Date: 01/25/2020 If Site Rite image not attached, placement could not be confirmed due to current cardiac rhythm.  Marland Kitchen amLODipine  10 mg Oral Daily  . Chlorhexidine Gluconate Cloth  6 each Topical Daily  . enoxaparin (LOVENOX) injection  55 mg Subcutaneous Daily  . hydrALAZINE  10 mg Intravenous Q4H  . insulin aspart  0-15 Units Subcutaneous Q4H  . sodium chloride flush  10-40 mL Intracatheter Q12H  . sodium chloride flush  3 mL Intravenous Q12H  . sodium chloride flush  3 mL Intravenous Q12H  . spironolactone  50 mg Oral Daily  . tamsulosin  0.8 mg Oral Daily    BMET    Component Value Date/Time   NA 145 01/26/2020  0205   NA 135 (L) 12/31/2013 1336   K 3.2 (L) 01/26/2020 0205   K 3.8 12/31/2013 1336   CL 117 (H) 01/26/2020 0205   CL 101 12/31/2013 1336   CO2 17 (L) 01/26/2020 0205   CO2 25 12/31/2013 1336   GLUCOSE 142 (H) 01/26/2020 0205   GLUCOSE 249 (H) 12/31/2013 1336   BUN 12 01/26/2020 0205   BUN 8 12/31/2013 1336   CREATININE 1.03 01/26/2020 0205   CREATININE 0.88 12/31/2013 1336   CALCIUM 8.8 (L) 01/26/2020 0205   CALCIUM 8.6 12/31/2013 1336   GFRNONAA >60 01/26/2020 0205   GFRNONAA >60 12/31/2013 1336   GFRAA >60 01/29/2018 1705   GFRAA >60 12/31/2013 1336   CBC    Component Value Date/Time   WBC 5.1 01/26/2020 0205   RBC 4.39 01/26/2020 0205   HGB 11.1 (L) 01/26/2020 0205   HGB 13.9 12/31/2013 1336   HCT 34.5 (L) 01/26/2020 0205   HCT 43.0 12/31/2013 1336   PLT 286 01/26/2020 0205   PLT 184 12/31/2013 1336   MCV 78.6 (L) 01/26/2020 0205   MCV 81 12/31/2013 1336   MCH 25.3 (L) 01/26/2020 0205   MCHC 32.2 01/26/2020 0205   RDW 16.2 (H) 01/26/2020 0205   RDW 14.4 12/31/2013 1336   LYMPHSABS 1.2 01/26/2020 0205   MONOABS 0.6 01/26/2020 0205   EOSABS 0.1 01/26/2020 0205   BASOSABS 0.0 01/26/2020 0205     Assessment/Plan:  1. AKI/CKD stage IIIb- due to diuresis in setting of anasarca/nephrotic syndrome related to diabetic nephropathy. Scr back at baseline and restarted diuretics.  1. Off of ACE/ARB for now. 2. Resume IV lasix when K improves above 3.5. 3. Continue aldactone 50  mg daily 4. Scr back at baseline and no indication for dialysis at this time. 5. Will continue to follow. 2. Anasarca/nephrotic syndrome as well as 3rd spacing from hypoalbuminemia and ileus. 1. Lasix on hold due to profound hypokalemia but continues to diurese. 3. Hypokalemia- continue to replete and continue aldactone 50 mg daily 1. Agree with 6 runs of IV KCl and K phos. 2. Continue to follow and goal for K >4 4. Ileus vs early partial small bowel obstruction. GI consulted and now with  NGT. 1. Continues to have large amounts of NG tube drainage (4 liters/day) 2. No significant improvement of ileus. 5. Anemia of CKD stage 3- cont to follow H/H. 6. HTN- on hydralazine 100 mg q6, aldactone 50 mg daily, and amlodipine 10 mg daily. Off of dilt and atenolol but could add labetalol 200 mg tid or carvedilol and follow. Off benazepril due to AKI.  7. DM type 2 - per primary, uncontrolled.  [name], MD [employer] [phone]

## 2020-01-26 NOTE — Progress Notes (Signed)
PROGRESS NOTE    [name]  WPY:[id number] DOB: 11-Jul-1960 DOA: 12/31/2019 PCP: Roy Perna, NP    Brief Narrative:  [name] was admitted to the hospital with a working diagnosis of nephrotic syndrome. Prolonged hospital stay, complicated by acute kidney injury, severe hypokalemia and ileus  59 year old male with past medical history for hypertension, type 2 diabetes mellitus, history of CVA, substance abuse and chronic leukopenia.  Patient reported cramping pain in his lower extremities, he recently moved from [place] and has been not taking his medications.  On his initial physical examination his blood pressure was 219/121, heart rate 84, respiratory 20, temperature 98, oxygen saturation 100%, his lungs were clear to auscultation bilaterally, heart S1-S2, present rhythmic, soft abdomen, positive bilateral pitting lower extremity edema up to the hips, Sodium 136, potassium 2.5, chloride 105, bicarb 24, glucose 252, BUN 10, creatinine 1.07.  White count 3.5, hemoglobin 11.0, hematocrit 34.6, platelets 266.  SARS COVID-19 negative.  Ck [id number] Urinalysis more than 500 glucose, more than 300 protein, specific gravity 1.025. EKG 90 bpm, normal axis, normal intervals, sinus rhythm, poor R wave progression, positive LVH, no ST segment T wave changes.  He was diagnosed with nephrotic syndrome, positive anasarca, he was placed on continuous infusion of furosemide with improvement of volume status.  Unfortunately had worsening kidney function that prevented further diuresis.  He has developed profound hypokalemia and now complicated with ileus.  Patient has required an NG tube for low intermittent suction.  Assessment & Plan:   Principal Problem:   Generalized body aches Active Problems:   Hypertension   Substance-related disorder (HCC)   Hypokalemia   Non-traumatic rhabdomyolysis   Grade I diastolic dysfunction   Anemia   Hypokalemia due to loss of potassium   Nephrotic  syndrome   Anasarca   Palliative care by specialist   Goals of care, counseling/discussion   Malnutrition of moderate degree   1. Nephrotic syndrome/ hypokalemia/hypophosphatemia. Patient continue to have edema lower extremities 3+, pitting. Holding on loop diuretics due to hypokalemia. Continue with spironolactone 50 mg daily.   Continue aggressive K correction, follow Mg in am. Avoid nephrotoxic medications of hypotension.  2. Ileus. Continue NG tube to low intermittent suction, output is 4,400 ml over last 24 hr. Patient reports flatus today and bowel movement yesterday. No nausea or vomiting. Abdomen continue very distended.   Supportive medical therapy, bowel rest and nutrition per TPN.   3. HTN Continue blood pressure control with amlodipine 10 mg daily, hydralazine and spironolactone.   4. Chronic anemia Hgb and Hct are stable. Continue close follow up on cell count  5. Moderate calorie protein malnutrition. Continue nutritional supplementation with TPN.    Patient continue to be at high risk for worsening hypokalemia and ileus.   Status is: Inpatient  Remains inpatient appropriate because:IV treatments appropriate due to intensity of illness or inability to take PO   Dispo: The patient is from: Home              Anticipated d/c is to: Home              Anticipated d/c date is: > 3 days              Patient currently is not medically stable to d/c.   DVT prophylaxis: Enoxaparin   Code Status:   full  Family Communication:  No family at the bedside      Nutrition Status: Nutrition Problem: Moderate Malnutrition Etiology: acute illness (colonic  ileus) Signs/Symptoms: energy intake < or equal to 50% for > or equal to 5 days, mild fat depletion, mild muscle depletion, edema Interventions: Ensure Enlive (each supplement provides 350kcal and 20 grams of protein), MVI       Consultants:   Nephrology   Surgery     Subjective: Patient reports positive flatus  today and bowel movement yesterday, continue to have high output from NG, no nausea or vomiting, no chest pain or dyspnea.   Objective: Vitals:   01/25/20 1913 01/26/20 0245 01/26/20 0300 01/26/20 0819  BP: (!) 146/93 (!) 141/78 (!) 149/91 (!) 148/84  Pulse: 90 89 91 92  Resp: 16 18 17 18   Temp: 98.6 F (37 C) 99 F (37.2 C) 99 F (37.2 C) 98.7 F (37.1 C)  TempSrc: Oral  Oral Oral  SpO2: 100% 99% 100% 100%  Weight:      Height:        Intake/Output Summary (Last 24 hours) at 01/26/2020 1443 Last data filed at 01/26/2020 0300 Gross per 24 hour  Intake 858.29 ml  Output 4650 ml  Net -3791.71 ml   Filed Weights   01/23/20 0500 01/24/20 0500 01/25/20 0500  Weight: 115 kg 113.9 kg 111.6 kg    Examination:   General: Not in pain or dyspnea, deconditioned  Neurology: Awake and alert, non focal  E ENT: mildpallor, no icterus, oral mucosa moist. NG tube in place.  Cardiovascular: No JVD. S1-S2 present, rhythmic, no gallops, rubs, or murmurs. +++ pitting bilateral lower extremity edema. Pulmonary: positive breath sounds bilaterally, adequate air movement, no wheezing, rhonchi or rales. Gastrointestinal. Abdomen distended, tympanic to percussion Skin. No rashes Musculoskeletal: no joint deformities     Data Reviewed: I have personally reviewed following labs and imaging studies  CBC: Recent Labs  Lab 01/26/20 0205  WBC 5.1  NEUTROABS 3.2  HGB 11.1*  HCT 34.5*  MCV 78.6*  PLT 237   Basic Metabolic Panel: Recent Labs  Lab 01/19/20 2050 01/20/20 0121 01/21/20 0453 01/21/20 0453 01/22/20 0206 01/22/20 0206 01/22/20 1526 01/22/20 1526 01/23/20 0310 01/23/20 0310 01/23/20 1451 01/24/20 0330 01/24/20 1525 01/25/20 0400 01/26/20 0205  NA  --    < > 140   < > 138   < > 140   < > 141   < > 141 143 145 145 145  K  --    < > 2.7*   < > 3.0*   < > 2.5*   < > 2.7*   < > 2.9* 2.8* 3.5 3.3* 3.2*  CL  --    < > 111   < > 110   < > 115*   < > 113*   < > 115* 118* 117*  115* 117*  CO2  --    < > 15*   < > 16*   < > 16*   < > 16*   < > 16* 16* 17* 17* 17*  GLUCOSE  --    < > 147*   < > 205*   < > 142*   < > 126*   < > 118* 91 111* 118* 142*  BUN  --    < > 14   < > 15   < > 19   < > 20   < > 21* 19 17 16 12   CREATININE  --    < > 1.16   < > 1.70*   < > 1.73*   < > 1.70*   < >  1.43* 1.24 1.23 1.13 1.03  CALCIUM  --    < > 8.6*   < > 8.7*   < > 8.8*   < > 9.0   < > 8.6* 8.7* 9.1 8.8* 8.8*  MG 2.1  --  1.9  --   --   --  1.9  --   --   --   --   --  2.2  --  1.8  PHOS  --    < > 1.7*   < > 2.0*  --   --   --  2.5  --   --  2.7  --  2.9 2.6   < > = values in this interval not displayed.   GFR: Estimated Creatinine Clearance: 93.2 mL/min (by C-G formula based on SCr of 1.03 mg/dL). Liver Function Tests: Recent Labs  Lab 01/22/20 0206 01/23/20 0310 01/24/20 0330 01/25/20 0400 01/26/20 0205  AST  --   --   --   --  12*  ALT  --   --   --   --  13  ALKPHOS  --   --   --   --  76  BILITOT  --   --   --   --  0.7  PROT  --   --   --   --  6.2*  ALBUMIN 2.7* 2.8* 2.4* 2.5* 2.4*   No results for input(s): LIPASE, AMYLASE in the last 168 hours. No results for input(s): AMMONIA in the last 168 hours. Coagulation Profile: No results for input(s): INR, PROTIME in the last 168 hours. Cardiac Enzymes: No results for input(s): CKTOTAL, CKMB, CKMBINDEX, TROPONINI in the last 168 hours. BNP (last 3 results) No results for input(s): PROBNP in the last 8760 hours. HbA1C: No results for input(s): HGBA1C in the last 72 hours. CBG: Recent Labs  Lab 01/25/20 1626 01/25/20 1938 01/25/20 2330 01/26/20 0339 01/26/20 1151  GLUCAP 129* 145* 134* 138* 164*   Lipid Profile: Recent Labs    01/26/20 0205  TRIG 71   Thyroid Function Tests: No results for input(s): TSH, T4TOTAL, FREET4, T3FREE, THYROIDAB in the last 72 hours. Anemia Panel: No results for input(s): VITAMINB12, FOLATE, FERRITIN, TIBC, IRON, RETICCTPCT in the last 72 hours.    Radiology  Studies: I have reviewed all of the imaging during this hospital visit personally     Scheduled Meds: . amLODipine  10 mg Oral Daily  . Chlorhexidine Gluconate Cloth  6 each Topical Daily  . enoxaparin (LOVENOX) injection  55 mg Subcutaneous Daily  . hydrALAZINE  10 mg Intravenous Q4H  . insulin aspart  0-15 Units Subcutaneous Q4H  . sodium chloride flush  10-40 mL Intracatheter Q12H  . sodium chloride flush  3 mL Intravenous Q12H  . sodium chloride flush  3 mL Intravenous Q12H  . spironolactone  50 mg Oral Daily  . tamsulosin  0.8 mg Oral Daily   Continuous Infusions: . sodium chloride Stopped (01/20/20 1900)  . potassium PHOSPHATE IVPB (in mmol)    . TPN ADULT (ION) 30 mL/hr at 01/25/20 1824  . TPN ADULT (ION)       LOS: 22 days        Milos Milligan Gerome Apley, MD

## 2020-01-26 NOTE — Care Management Important Message (Signed)
Important Message  Patient Details  Name: [name] MRN: [id number] Date of Birth: [birth year]   Medicare Important Message Given:  Yes     Jamee Pacholski P [place] 01/26/2020, 3:12 PM

## 2020-01-26 NOTE — Progress Notes (Signed)
[facility] Gastroenterology Progress Note  [name] 59 y.o. 1960/06/21   Subjective: Reports small stool overnight but does not know consistency and not documented in flowsheet. Denies abdominal pain. Feels ok. Would like NGT out.  Objective: Vital signs: Vitals:   01/26/20 0300 01/26/20 0819  BP: (!) 149/91 (!) 148/84  Pulse: 91 92  Resp: 17 18  Temp: 99 F (37.2 C) 98.7 F (37.1 C)  SpO2: 100% 100%    Physical Exam: Gen: lethargic, ill-appearing, no acute distress  HEENT: anicteric sclera, poor dentition CV: RRR Chest: CTA B Abd: distended, diffuse tenderness with guarding, decreased BS Ext: no edema  Lab Results: Recent Labs    01/24/20 1525 01/24/20 1525 01/25/20 0400 01/26/20 0205  NA 145   < > 145 145  K 3.5   < > 3.3* 3.2*  CL 117*   < > 115* 117*  CO2 17*   < > 17* 17*  GLUCOSE 111*   < > 118* 142*  BUN 17   < > 16 12  CREATININE 1.23   < > 1.13 1.03  CALCIUM 9.1   < > 8.8* 8.8*  MG 2.2  --   --  1.8  PHOS  --   --  2.9 2.6   < > = values in this interval not displayed.   Recent Labs    01/25/20 0400 01/26/20 0205  AST  --  12*  ALT  --  13  ALKPHOS  --  76  BILITOT  --  0.7  PROT  --  6.2*  ALBUMIN 2.5* 2.4*   Recent Labs    01/26/20 0205  WBC 5.1  NEUTROABS 3.2  HGB 11.1*  HCT 34.5*  MCV 78.6*  PLT 286      Assessment/Plan: Ileus - slow to resolve. Aggressive repletion of potassium to keep greater than 4. NG output in last 12 hours of 1950 cc. Would keep NGT LWIS in again today and consider d/cing in next 1-2 days if output drops significantly. Supportive care. Need documentation of all BMs. Will follow.   [name] 01/26/2020, 8:35 AM  Questions please call [phone] ID: [name], male   DOB: 1960-05-27, 59 y.o.   MRN: [id number]

## 2020-01-26 NOTE — Progress Notes (Signed)
PHARMACY - TOTAL PARENTERAL NUTRITION CONSULT NOTE  Indication: Prolonged ileus  Patient Measurements: Height: 5\' 7"  (170.2 cm) Weight: 111.6 kg (246 lb 0.5 oz) IBW/kg (Calculated) : 66.1 TPN AdjBW (KG): 76.8 Body mass index is 38.53 kg/m. Usual Weight: 111 kg  Assessment:  31 YOM with PMH significant for HTN and DM, presented on 12/31/19 with aches, HTN urgency and hypokalemia.  He then developed diarrhea with abdominal distension and AKI.  Multiple imagings show gaseous dilatation.  Underwent colonoscopy for decompression on 11/24 and NG tubed was replaced on 11/28 with 1L+ of semi-feculent output.  The latest Abd XR on 11/29 showed colonic ileus pattern.  Patient was on a CMD on 11/11-11/12 and 11/16-11/21, otherwise, he was either NPO or on a CLD throughout the hospitalization.  Nutrition has been inadequate and Pharmacy consulted to manage TPN.  Patient reports no recent weight loss.  Glucose / Insulin: hx poorly controlled DM with A1c 10.6% - CBGs low normal since NPO.  Used 8 units SSI in last 24h, off Lantus 10/d on 11/29.  Anticipate CBGs to trend up with TPN initiation. Electrolytes: K 3.2 (6 runs + 14.6 mEq from KPhos, received ~262mEq K yesterday, goal >/= 4 for ileus), Mg 1.8 (goal >=2 for ileus), high CL and low CO2, others WNL Renal: nephrotic syndrome (BL SCr 1.3-1.5) - SCr 1.03, BUN WNL - Lasix held d/t hypoK LFTs / TGs: LFTs / tbili WNL Prealbumin / albumin: albumin 2.4 Intake / Output; MIVF: extensively volume overloaded per Renal 11/28, receiving 45NS + 40 mEq/L KCl at 75 ml/h x 24h - UOP 0.6 ml/kg/hr, NG 4.4L, net -21L, LBM 11/30 (per notes) GI Imaging:   11/29 Abd XR: colonic ileus pattern w/o significant change 12/1 Abd XR: persistent bowel dilation, likely ileus Surgeries / Procedures: N/A  Central access: PICC placed 01/25/20 TPN start date: 01/25/20  Nutritional Goals (per RD rec 01/25/20): 2250-2450 kCal, 120-140gm protein per day, >2 L fluid  Current  Nutrition:  NPO  Plan:  Increase concentrated TPN to 44mL/hr at 1800 (goal rate 80 ml/hr) TPN will provide 78g AA, 198g CHO and 43g ILE for a total of 1411 kCal, meeting ~62% of kCal needs Electrolytes in TPN: Na 86mEq/L, K 18mEq/L, Ca 81mEq/L, Mag 19mEq/L, Phos 66mmol/L, max acetate for now Add standard MVI and trace elements to TPN MIVF per MD Continue moderate SSI Q4H + 10 units regular insulin in TPN, adjust prn KPhos 10 mmol IV x1 KCl 10 mEq IV q1h x6 Mg 2 g IV x1 Standard TPN labs and nursing care orders Monitor CBGs and electrolytes to advance TPN  Thank you for involving pharmacy in this patient's care.  Renold Genta, PharmD, BCPS Clinical Pharmacist Clinical phone for 01/26/2020 until 3p is [phone] 01/26/2020 7:10 AM  **Pharmacist phone directory can be found on [email or web address] listed under [place]**

## 2020-01-27 ENCOUNTER — Inpatient Hospital Stay (HOSPITAL_COMMUNITY): Payer: Medicare HMO

## 2020-01-27 DIAGNOSIS — E440 Moderate protein-calorie malnutrition: Secondary | ICD-10-CM

## 2020-01-27 LAB — COMPREHENSIVE METABOLIC PANEL
ALT: 12 U/L (ref 0–44)
AST: 14 U/L — ABNORMAL LOW (ref 15–41)
Albumin: 2.4 g/dL — ABNORMAL LOW (ref 3.5–5.0)
Alkaline Phosphatase: 75 U/L (ref 38–126)
Anion gap: 12 (ref 5–15)
BUN: 16 mg/dL (ref 6–20)
CO2: 25 mmol/L (ref 22–32)
Calcium: 9.2 mg/dL (ref 8.9–10.3)
Chloride: 114 mmol/L — ABNORMAL HIGH (ref 98–111)
Creatinine, Ser: 1.04 mg/dL (ref 0.61–1.24)
GFR, Estimated: >60 mL/min (ref >60)
Glucose, Bld: 213 mg/dL — ABNORMAL HIGH (ref 70–99)
Potassium: 3.2 mmol/L — ABNORMAL LOW (ref 3.5–5.1)
Sodium: 151 mmol/L — ABNORMAL HIGH (ref 135–145)
Total Bilirubin: 0.5 mg/dL (ref 0.3–1.2)
Total Protein: 6.4 g/dL — ABNORMAL LOW (ref 6.5–8.1)

## 2020-01-27 LAB — GLUCOSE, CAPILLARY
Glucose-Capillary: 185 mg/dL — ABNORMAL HIGH (ref 70–99)
Glucose-Capillary: 189 mg/dL — ABNORMAL HIGH (ref 70–99)
Glucose-Capillary: 191 mg/dL — ABNORMAL HIGH (ref 70–99)
Glucose-Capillary: 205 mg/dL — ABNORMAL HIGH (ref 70–99)
Glucose-Capillary: 214 mg/dL — ABNORMAL HIGH (ref 70–99)
Glucose-Capillary: 233 mg/dL — ABNORMAL HIGH (ref 70–99)

## 2020-01-27 LAB — PHOSPHORUS: Phosphorus: 3.4 mg/dL (ref 2.5–4.6)

## 2020-01-27 LAB — MAGNESIUM: Magnesium: 2.1 mg/dL (ref 1.7–2.4)

## 2020-01-27 MED ORDER — SODIUM CHLORIDE 0.45 % IV SOLN: INTRAVENOUS | Status: DC

## 2020-01-27 MED ORDER — POTASSIUM CHLORIDE 10 MEQ/50ML IV SOLN
10.0000 meq | INTRAVENOUS | Status: AC
  Administered 2020-01-27 (×2): 10 meq via INTRAVENOUS
  Filled 2020-01-27 (×2): qty 50

## 2020-01-27 MED ORDER — POTASSIUM CHLORIDE 10 MEQ/50ML IV SOLN
10.0000 meq | INTRAVENOUS | Status: DC
  Administered 2020-01-27 (×3): 10 meq via INTRAVENOUS
  Filled 2020-01-27 (×5): qty 50

## 2020-01-27 MED ORDER — TRACE MINERALS CU-MN-SE-ZN 300-55-60-3000 MCG/ML IV SOLN
INTRAVENOUS | Status: AC
  Filled 2020-01-27: qty 832

## 2020-01-27 MED ORDER — INSULIN ASPART 100 UNIT/ML ~~LOC~~ SOLN
0.0000 [IU] | SUBCUTANEOUS | Status: DC
  Administered 2020-01-27: 4 [IU] via SUBCUTANEOUS
  Administered 2020-01-27 (×2): 7 [IU] via SUBCUTANEOUS
  Administered 2020-01-28: 3 [IU] via SUBCUTANEOUS
  Administered 2020-01-28: 11 [IU] via SUBCUTANEOUS
  Administered 2020-01-28: 4 [IU] via SUBCUTANEOUS
  Administered 2020-01-29 (×2): 7 [IU] via SUBCUTANEOUS
  Administered 2020-01-29: 11 [IU] via SUBCUTANEOUS
  Administered 2020-01-29: 4 [IU] via SUBCUTANEOUS
  Administered 2020-01-29: 7 [IU] via SUBCUTANEOUS
  Administered 2020-01-29 – 2020-01-30 (×2): 4 [IU] via SUBCUTANEOUS
  Administered 2020-01-30: 7 [IU] via SUBCUTANEOUS
  Administered 2020-01-30: 4 [IU] via SUBCUTANEOUS
  Administered 2020-01-30 – 2020-01-31 (×2): 3 [IU] via SUBCUTANEOUS
  Administered 2020-01-31: 4 [IU] via SUBCUTANEOUS

## 2020-01-27 NOTE — Progress Notes (Signed)
Started NG tube on intermittent suction.

## 2020-01-27 NOTE — Progress Notes (Signed)
Output from NG tube from 1900-2130 over 1100 clear thin fluid.    We (day shift RN [place] and myself) discussed patient's intake at hand off..  Pt has been getting pitchers full for his "ice chips".    At 2130 I removed the pitcher from patient's tray table.  I made sure he had swabs and rinse from oral care kit, and I gave him 1/2 cup of ice chips with a spoon.  Pt was very upset about the restriction of his ice.    I let patient know the ice chips were to moisten his mouth and did not mean he could intake liters.  I also advised patient that the NG tube out couldn't come out until the output was reduced which wouldn't happen with the amount he was taking in.    I let the CNA know he could have a 1/2 cup of ice chips every 1.5-2 hrs.  Pt attempted to get pitcher of ice from different staff members throughout the night.    NG tube output volume significantly reduced, now brown and cloudy.    Pt "acting out" throughout the night: frequent calls, frequent requests, etc.    I reported the need to restrict the amount of ice patient is given to day shift RN and CNA.

## 2020-01-27 NOTE — Progress Notes (Signed)
Patient ID: [name], male   DOB: 1960/04/28, 59 y.o.   MRN: [id number] S: No new complaints. O:BP 130/82 (BP Location: Right Arm)   Pulse 99   Temp 98.7 F (37.1 C) (Oral)   Resp 17   Ht 5\' 7"  (1.702 m)   Wt 111.6 kg   SpO2 99%   BMI 38.53 kg/m   Intake/Output Summary (Last 24 hours) at 01/27/2020 1326 Last data filed at 01/27/2020 0600 Gross per 24 hour  Intake 1809.97 ml  Output 4700 ml  Net -2890.03 ml   Intake/Output: I/O last 3 completed shifts: In: 2650 [P.O.:1080; I.V.:1295; IV Piggyback:275.1] Out: 9600 [Urine:1500; Emesis/NG output:8100]  Intake/Output this shift:  No intake/output data recorded. Weight change:  Gen: sitting up in wheelchair CVS: RRR Resp: cta Abd: distended, hypoactive BS, tender to palpation Ext: trace brawny edema bilateral lower extremities  Recent Labs  Lab 01/21/20 0453 01/21/20 0453 01/22/20 0206 01/22/20 1526 01/23/20 0310 01/23/20 1451 01/24/20 0330 01/24/20 1525 01/25/20 0400 01/26/20 0205 01/27/20 0344  NA 140   < > 138   < > 141 141 143 145 145 145 151*  K 2.7*   < > 3.0*   < > 2.7* 2.9* 2.8* 3.5 3.3* 3.2* 3.2*  CL 111   < > 110   < > 113* 115* 118* 117* 115* 117* 114*  CO2 15*   < > 16*   < > 16* 16* 16* 17* 17* 17* 25  GLUCOSE 147*   < > 205*   < > 126* 118* 91 111* 118* 142* 213*  BUN 14   < > 15   < > 20 21* 19 17 16 12 16   CREATININE 1.16   < > 1.70*   < > 1.70* 1.43* 1.24 1.23 1.13 1.03 1.04  ALBUMIN 2.5*  --  2.7*  --  2.8*  --  2.4*  --  2.5* 2.4* 2.4*  CALCIUM 8.6*   < > 8.7*   < > 9.0 8.6* 8.7* 9.1 8.8* 8.8* 9.2  PHOS 1.7*  --  2.0*  --  2.5  --  2.7  --  2.9 2.6 3.4  AST  --   --   --   --   --   --   --   --   --  12* 14*  ALT  --   --   --   --   --   --   --   --   --  13 12   < > = values in this interval not displayed.   Liver Function Tests: Recent Labs  Lab 01/25/20 0400 01/26/20 0205 01/27/20 0344  AST  --  12* 14*  ALT  --  13 12  ALKPHOS  --  76 75  BILITOT  --  0.7 0.5  PROT  --  6.2*  6.4*  ALBUMIN 2.5* 2.4* 2.4*   No results for input(s): LIPASE, AMYLASE in the last 168 hours. No results for input(s): AMMONIA in the last 168 hours. CBC: Recent Labs  Lab 01/26/20 0205  WBC 5.1  NEUTROABS 3.2  HGB 11.1*  HCT 34.5*  MCV 78.6*  PLT 286   Cardiac Enzymes: No results for input(s): CKTOTAL, CKMB, CKMBINDEX, TROPONINI in the last 168 hours. CBG: Recent Labs  Lab 01/26/20 2003 01/27/20 0048 01/27/20 0445 01/27/20 0735 01/27/20 1200  GLUCAP 179* 191* 205* 185* 214*    Iron Studies: No results for input(s): IRON, TIBC, TRANSFERRIN, FERRITIN in  the last 72 hours. Studies/Results: DG Abd 1 View  Result Date: 01/26/2020 CLINICAL DATA:  Ileus EXAM: ABDOMEN - 1 VIEW COMPARISON:  January 24, 2020 FINDINGS: Nasogastric tube tip and side port are in the stomach. There is persistent bowel dilatation without air-fluid levels. No free air evident. Visualized left lung base clear. IMPRESSION: Persistent bowel dilatation, likely ileus. No free air evident. Nasogastric tube tip and side port in stomach. Electronically Signed   By: Lowella Grip III M.D.   On: 01/26/2020 08:04   . amLODipine  10 mg Oral Daily  . Chlorhexidine Gluconate Cloth  6 each Topical Daily  . enoxaparin (LOVENOX) injection  55 mg Subcutaneous Daily  . hydrALAZINE  10 mg Intravenous Q4H  . insulin aspart  0-20 Units Subcutaneous Q4H  . sodium chloride flush  10-40 mL Intracatheter Q12H  . sodium chloride flush  3 mL Intravenous Q12H  . sodium chloride flush  3 mL Intravenous Q12H  . spironolactone  50 mg Oral Daily  . tamsulosin  0.8 mg Oral Daily    BMET    Component Value Date/Time   NA 151 (H) 01/27/2020 0344   NA 135 (L) 12/31/2013 1336   K 3.2 (L) 01/27/2020 0344   K 3.8 12/31/2013 1336   CL 114 (H) 01/27/2020 0344   CL 101 12/31/2013 1336   CO2 25 01/27/2020 0344   CO2 25 12/31/2013 1336   GLUCOSE 213 (H) 01/27/2020 0344   GLUCOSE 249 (H) 12/31/2013 1336   BUN 16 01/27/2020 0344    BUN 8 12/31/2013 1336   CREATININE 1.04 01/27/2020 0344   CREATININE 0.88 12/31/2013 1336   CALCIUM 9.2 01/27/2020 0344   CALCIUM 8.6 12/31/2013 1336   GFRNONAA >60 01/27/2020 0344   GFRNONAA >60 12/31/2013 1336   GFRAA >60 01/29/2018 1705   GFRAA >60 12/31/2013 1336   CBC    Component Value Date/Time   WBC 5.1 01/26/2020 0205   RBC 4.39 01/26/2020 0205   HGB 11.1 (L) 01/26/2020 0205   HGB 13.9 12/31/2013 1336   HCT 34.5 (L) 01/26/2020 0205   HCT 43.0 12/31/2013 1336   PLT 286 01/26/2020 0205   PLT 184 12/31/2013 1336   MCV 78.6 (L) 01/26/2020 0205   MCV 81 12/31/2013 1336   MCH 25.3 (L) 01/26/2020 0205   MCHC 32.2 01/26/2020 0205   RDW 16.2 (H) 01/26/2020 0205   RDW 14.4 12/31/2013 1336   LYMPHSABS 1.2 01/26/2020 0205   MONOABS 0.6 01/26/2020 0205   EOSABS 0.1 01/26/2020 0205   BASOSABS 0.0 01/26/2020 0205    Assessment/Plan:  1. AKI/CKD stage IIIb- due to diuresis in setting of anasarca/nephrotic syndrome related to diabetic nephropathy. Scr back at baseline and restarted diuretics.  1. Off of ACE/ARB for now. 2. Resume IV lasix when K improves above 3.5. 3. Continue aldactone 50 mg daily 4. Scr back at baseline and no indication for dialysis at this time. 5. Will continue to follow. 2. Anasarca/nephrotic syndrome as well as 3rd spacing from hypoalbuminemia and ileus. 1. Lasix on hold due to profound hypokalemia but continues to diurese. 3. Hypokalemia- continue to replete andcontinuealdactone 50 mg daily 1. redose KCl runs of 10 mEq x 5. 2. Continue to follow and goal for K >4 4. Ileus vs early partial small bowel obstruction. GI consulted and now with NGT. 1. Continues to have large amounts of NG tube drainage (4 liters/day) 2. No significant improvement of ileus. 5. Hypernatremia- needs more free water and change IVF's to  1/2NS not NS and follow 6. Anemia of CKD stage 3- cont to follow H/H. 7. HTN- on hydralazine 100 mg q6, aldactone 50 mg daily, and  amlodipine 10 mg daily. Off of dilt and atenolol but could add labetalol 200 mg tidor carvediloland follow. Off benazepril due to AKI.  8. DM type 2 - per primary, uncontrolled.  Roy Potts, MD [employer] [phone]

## 2020-01-27 NOTE — Progress Notes (Signed)
NG tube output only 121ml. NG tube removed without any issue.

## 2020-01-27 NOTE — Progress Notes (Signed)
PROGRESS NOTE    KERIN CECCHI  KZS:[id number] DOB: 1960/11/01 DOA: 12/31/2019 PCP: [name], NP    Brief Narrative:  Mr. Ogan was admitted to the hospital with a working diagnosis of nephrotic syndrome. Prolonged hospital stay, complicated by acute kidney injury, severe hypokalemia and ileus  59 year old male with past medical history for hypertension, type 2 diabetes mellitus, history of CVA, substance abuse and chronic leukopenia.  Patient reported cramping pain in his lower extremities, he recently moved from [place] and has been not taking his medications.  On his initial physical examination his blood pressure was 219/121, heart rate 84, respiratory 20, temperature 98, oxygen saturation 100%, his lungs were clear to auscultation bilaterally, heart S1-S2, present rhythmic, soft abdomen, positive bilateral pitting lower extremity edema up to the hips, Sodium 136, potassium 2.5, chloride 105, bicarb 24, glucose 252, BUN 10, creatinine 1.07.  White count 3.5, hemoglobin 11.0, hematocrit 34.6, platelets 266.  SARS COVID-19 negative.  Ck 970-568 Urinalysis more than 500 glucose, more than 300 protein, specific gravity 1.025. EKG 90 bpm, normal axis, normal intervals, sinus rhythm, poor R wave progression, positive LVH, no ST segment T wave changes.  He was diagnosed with nephrotic syndrome, positive anasarca, he was placed on continuous infusion of furosemide with improvement of volume status.  Unfortunately had worsening kidney function that prevented further diuresis.  He has developed profound hypokalemia and now complicated with ileus.  Patient has required an NG tube for low intermittent suction.   Assessment & Plan:   Principal Problem:   Generalized body aches Active Problems:   Hypertension   Substance-related disorder (HCC)   Hypokalemia   Non-traumatic rhabdomyolysis   Grade I diastolic dysfunction   Anemia   Hypokalemia due to loss of potassium   Nephrotic  syndrome   Anasarca   Palliative care by specialist   Goals of care, counseling/discussion   Malnutrition of moderate degree    1. Nephrotic syndrome/ hypokalemia/hypophosphatemia. Persistent 3+, pitting edema lower extremtities.  Urine output over last 24 hr is 750 ml. Stable renal function with serum cr at 1,0 and bicarbonate at 25.  Persistent hypokalemia down to 3,2 and now worsening hypernatremia up to 151, Cl 114 , Mg 2,1, P 3,4  Continue K correction with TPN plus 50 meq Kcl IV. NG tube has been clamped, will allow sips of water, free water may help in correction of hypernatremia. Decrease Na content of TPN.   Continue diuretic therapy with spironolactone.   2. Ileus. Documented NG output is 6,150 ml over last 24 hr. Reports bowel movement, but his abdomen continue to be very distended. Abdominal film from yesterday with persistent ileus, personally reviewed.   Tube clamped this am, so far patient with no nausea or vomiting. Check abdominal films today.   3. HTN On amlodipine 10 mg daily, hydralazine and spironolactone.   4. Chronic anemia. Follow cell count intermittently,   5. Moderate calorie protein malnutrition. On nutritional supplementation with TPN.   6. Metabolic encephalopathy. Patient has been calm, no agitation or confusion, will discontinue haloperidol for now.   Patient continue to be at high risk for worsening ileus and electrolyte abnormalities.   Status is: Inpatient  Remains inpatient appropriate because:IV treatments appropriate due to intensity of illness or inability to take PO   Dispo: The patient is from: Home              Anticipated d/c is to: Home  Anticipated d/c date is: > 3 days              Patient currently is not medically stable to d/c.  DVT prophylaxis: Enoxaparin   Code Status:   full  Family Communication:  No family at the bedside      Nutrition Status: Nutrition Problem: Moderate Malnutrition Etiology: acute  illness (colonic ileus) Signs/Symptoms: energy intake < or equal to 50% for > or equal to 5 days, mild fat depletion, mild muscle depletion, edema Interventions: Ensure Enlive (each supplement provides 350kcal and 20 grams of protein), MVI     Skin Documentation:     Consultants:   Surgery   Nephrology   GI       Subjective: Patient somnolent at the time of my examination, declines nausea or vomiting, no abdominal pain.   Objective: Vitals:   01/26/20 1507 01/26/20 2001 01/27/20 0524 01/27/20 0747  BP: (!) 146/88 (!) 155/79 131/79 130/82  Pulse: 96 (!) 102 (!) 103 99  Resp: 17 17 18 17   Temp: 97.9 F (36.6 C) 98.5 F (36.9 C) 98.9 F (37.2 C) 98.7 F (37.1 C)  TempSrc: Oral Oral Oral Oral  SpO2: 98% 99% 98% 99%  Weight:      Height:        Intake/Output Summary (Last 24 hours) at 01/27/2020 1213 Last data filed at 01/27/2020 0600 Gross per 24 hour  Intake 1809.97 ml  Output 4700 ml  Net -2890.03 ml   Filed Weights   01/23/20 0500 01/24/20 0500 01/25/20 0500  Weight: 115 kg 113.9 kg 111.6 kg    Examination:   General: Not in pain or dyspnea. Deconditioned  Neurology: somnolent  E ENT: no pallor, no icterus, oral mucosa moist Cardiovascular: No JVD. S1-S2 present, rhythmic, no gallops, rubs, or murmurs. +++ pitting bilateral lower extremity edema. Pulmonary: positive breath sounds bilaterally, adequate air movement, no wheezing, rhonchi or rales. Gastrointestinal. Abdomen distended, tympanic to percussion, tender to superficial palpation. Positive bowel sounds Skin. No rashes Musculoskeletal: no joint deformities     Data Reviewed: I have personally reviewed following labs and imaging studies  CBC: Recent Labs  Lab 01/26/20 0205  WBC 5.1  NEUTROABS 3.2  HGB 11.1*  HCT 34.5*  MCV 78.6*  PLT 810   Basic Metabolic Panel: Recent Labs  Lab 01/21/20 0453 01/22/20 0206 01/22/20 1526 01/22/20 1526 01/23/20 0310 01/23/20 1451 01/24/20 0330  01/24/20 1525 01/25/20 0400 01/26/20 0205 01/27/20 0344  NA 140   < > 140   < > 141   < > 143 145 145 145 151*  K 2.7*   < > 2.5*   < > 2.7*   < > 2.8* 3.5 3.3* 3.2* 3.2*  CL 111   < > 115*   < > 113*   < > 118* 117* 115* 117* 114*  CO2 15*   < > 16*   < > 16*   < > 16* 17* 17* 17* 25  GLUCOSE 147*   < > 142*   < > 126*   < > 91 111* 118* 142* 213*  BUN 14   < > 19   < > 20   < > 19 17 16 12 16   CREATININE 1.16   < > 1.73*   < > 1.70*   < > 1.24 1.23 1.13 1.03 1.04  CALCIUM 8.6*   < > 8.8*   < > 9.0   < > 8.7* 9.1 8.8* 8.8* 9.2  MG  1.9  --  1.9  --   --   --   --  2.2  --  1.8 2.1  PHOS 1.7*   < >  --   --  2.5  --  2.7  --  2.9 2.6 3.4   < > = values in this interval not displayed.   GFR: Estimated Creatinine Clearance: 92.3 mL/min (by C-G formula based on SCr of 1.04 mg/dL). Liver Function Tests: Recent Labs  Lab 01/23/20 0310 01/24/20 0330 01/25/20 0400 01/26/20 0205 01/27/20 0344  AST  --   --   --  12* 14*  ALT  --   --   --  13 12  ALKPHOS  --   --   --  76 75  BILITOT  --   --   --  0.7 0.5  PROT  --   --   --  6.2* 6.4*  ALBUMIN 2.8* 2.4* 2.5* 2.4* 2.4*   No results for input(s): LIPASE, AMYLASE in the last 168 hours. No results for input(s): AMMONIA in the last 168 hours. Coagulation Profile: No results for input(s): INR, PROTIME in the last 168 hours. Cardiac Enzymes: No results for input(s): CKTOTAL, CKMB, CKMBINDEX, TROPONINI in the last 168 hours. BNP (last 3 results) No results for input(s): PROBNP in the last 8760 hours. HbA1C: No results for input(s): HGBA1C in the last 72 hours. CBG: Recent Labs  Lab 01/26/20 2003 01/27/20 0048 01/27/20 0445 01/27/20 0735 01/27/20 1200  GLUCAP 179* 191* 205* 185* 214*   Lipid Profile: Recent Labs    01/26/20 0205  TRIG 71   Thyroid Function Tests: No results for input(s): TSH, T4TOTAL, FREET4, T3FREE, THYROIDAB in the last 72 hours. Anemia Panel: No results for input(s): VITAMINB12, FOLATE, FERRITIN,  TIBC, IRON, RETICCTPCT in the last 72 hours.    Radiology Studies: I have reviewed all of the imaging during this hospital visit personally     Scheduled Meds: . amLODipine  10 mg Oral Daily  . Chlorhexidine Gluconate Cloth  6 each Topical Daily  . enoxaparin (LOVENOX) injection  55 mg Subcutaneous Daily  . hydrALAZINE  10 mg Intravenous Q4H  . insulin aspart  0-20 Units Subcutaneous Q4H  . sodium chloride flush  10-40 mL Intracatheter Q12H  . sodium chloride flush  3 mL Intravenous Q12H  . sodium chloride flush  3 mL Intravenous Q12H  . spironolactone  50 mg Oral Daily  . tamsulosin  0.8 mg Oral Daily   Continuous Infusions: . sodium chloride Stopped (01/20/20 1900)  . potassium chloride 10 mEq (01/27/20 1157)  . TPN ADULT (ION) 50 mL/hr at 01/26/20 1753  . TPN ADULT (ION)       LOS: 23 days        Almedia Cordell Gerome Apley, MD

## 2020-01-27 NOTE — Plan of Care (Signed)
  Problem: Clinical Measurements: Goal: Ability to maintain clinical measurements within normal limits will improve Outcome: Progressing Goal: Will remain free from infection Outcome: Progressing Goal: Diagnostic test results will improve Outcome: Progressing Goal: Respiratory complications will improve Outcome: Progressing Goal: Cardiovascular complication will be avoided Outcome: Progressing   Problem: Activity: Goal: Risk for activity intolerance will decrease Outcome: Progressing   Problem: Nutrition: Goal: Adequate nutrition will be maintained Outcome: Progressing   Problem: Elimination: Goal: Will not experience complications related to bowel motility Outcome: Progressing Goal: Will not experience complications related to urinary retention Outcome: Progressing   Problem: Coping: Goal: Level of anxiety will decrease Outcome: Progressing   Problem: Pain Managment: Goal: General experience of comfort will improve Outcome: Progressing   Problem: Safety: Goal: Ability to remain free from injury will improve Outcome: Progressing

## 2020-01-27 NOTE — Progress Notes (Signed)
Physical Therapy Treatment Patient Details Name: KANNAN PROIA MRN: [id number] DOB: 01/17/1961 Today's Date: 01/27/2020    History of Present Illness 59 yo male, presented with body aches due to rhabdomyolysis, volume overload, nephrotic syndrom and AKI.  Started on IV diuresis with nephrology consulted.  NGT placed 11/28.  PMH: HTN, uncontrolled DM 2, chronic leukopenia, CVA, substance abuse.    PT Comments    Pt supine in bed and very eager to get OOB and move in his power WC.  He continues to present with slow processing and poor safety awareness.  Based on need for +2 assistance will continue to recommend SNF placement for continued therapy he is making slow progress.     Follow Up Recommendations  SNF     Equipment Recommendations  Hospital bed;Rolling walker with 5" wheels;Other (comment) (mechanical lift and hospital bed.)    Recommendations for Other Services       Precautions / Restrictions Precautions Precautions: Fall Precaution Comments: BLE edema Restrictions Weight Bearing Restrictions: No    Mobility  Bed Mobility Overal bed mobility: Needs Assistance Bed Mobility: Rolling;Supine to Sit;Sit to Supine     Supine to sit: Mod assist;+2 for physical assistance     General bed mobility comments: Pt required assistance to move to edge of bed and elevate trunk into a seated position.  Once in sitting heavy use of bed pad to scoot to edge of bed.  Transfers Overall transfer level: Needs assistance Equipment used: Ambulation equipment used (sara stedy) Transfers: Sit to/from Stand Sit to Stand: Mod assist;+2 physical assistance;From elevated surface Stand pivot transfers: Total assist (on sara stedy)       General transfer comment: Pt required assistance to move into standing with boosting of hips holding to bed pad.  Pt with frequent pauses and required cues to redirect to activity.  Very slow to process with cues for hand placement and step by step  instruction.  Ambulation/Gait Ambulation/Gait assistance:  (NT uses power chair at baseline)               Theme park manager mobility: Yes Wheelchair propulsion:  (power WC using R hand to control device.) Wheelchair parts: Supervision/cueing Distance: [place] Details (indicate cue type and reason): Pt with poor safety awareness on R requiring cues frenquently to negotiate obstacles on the R side.  Pt required x 2 rest periods due to fatigue in core and R UE.  Very poor tolerance to limited activity.  Modified Rankin (Stroke Patients Only)       Balance Overall balance assessment: Needs assistance Sitting-balance support: Feet supported;Single extremity supported Sitting balance-Leahy Scale: Fair Sitting balance - Comments: Fair with B UEs to support patient.  Without posture to R with increased risk of fall. Postural control: Posterior lean;Right lateral lean Standing balance support: Bilateral upper extremity supported;During functional activity Standing balance-Leahy Scale: Poor Standing balance comment: +2 external assistance to rise into standing.                            Cognition Arousal/Alertness: Awake/alert Behavior During Therapy: Anxious;Flat affect Overall Cognitive Status: No family/caregiver present to determine baseline cognitive functioning                                 General Comments: Very slow  to process with random pause in mobility and no reason stated.  Required constant encouragement throughout the session and cues to redirect to activity.      Exercises      General Comments        Pertinent Vitals/Pain Pain Assessment: 0-10 Pain Score: 7  Pain Location: back pain Pain Descriptors / Indicators: Grimacing Pain Intervention(s): Monitored during session;Repositioned    Home Living                      Prior Function             PT Goals (current goals can now be found in the care plan section) Acute Rehab PT Goals Patient Stated Goal: to get to EOB Time For Goal Achievement: 02/01/20 Potential to Achieve Goals: Fair Additional Goals Additional Goal #1: Pt will mobilize in power wheelchair for 100' around obstacles at a modI level Progress towards PT goals: Progressing toward goals (slow progress.)    Frequency    Min 2X/week      PT Plan Current plan remains appropriate    Co-evaluation              AM-PAC PT "6 Clicks" Mobility   Outcome Measure  Help needed turning from your back to your side while in a flat bed without using bedrails?: A Lot Help needed moving from lying on your back to sitting on the side of a flat bed without using bedrails?: A Lot Help needed moving to and from a bed to a chair (including a wheelchair)?: Total Help needed standing up from a chair using your arms (e.g., wheelchair or bedside chair)?: A Lot Help needed to walk in hospital room?: Total Help needed climbing 3-5 steps with a railing? : Total 6 Click Score: 9    End of Session Equipment Utilized During Treatment: Gait belt Activity Tolerance: Patient tolerated treatment well Patient left: in chair;with call bell/phone within reach;with chair alarm set (Pt with posey sitter alarm belt applied to his lap as he has had previous falls on admission.) Nurse Communication: Mobility status;Need for lift equipment PT Visit Diagnosis: Other abnormalities of gait and mobility (R26.89);Muscle weakness (generalized) (M62.81);Difficulty in walking, not elsewhere classified (R26.2)     Time: 0017-4944 PT Time Calculation (min) (ACUTE ONLY): 33 min  Charges:  $Therapeutic Activity: 23-37 mins                     Erasmo Leventhal , PTA Acute Rehabilitation Services Pager [phone] Office [phone]     Manar Smalling Eli Hose 01/27/2020, 12:40 PM

## 2020-01-27 NOTE — Progress Notes (Signed)
[facility] Gastroenterology Progress Note  [name] 59 y.o. 20-Dec-1960   Subjective: Denies N/V. Denies BMs. Ice chips restricted yesterday.  Objective: Vital signs: Vitals:   01/27/20 0524 01/27/20 0747  BP: 131/79 130/82  Pulse: (!) 103 99  Resp: 18 17  Temp: 98.9 F (37.2 C) 98.7 F (37.1 C)  SpO2: 98% 99%    Physical Exam: Gen: lethargic, ill-appearing, no acute distress  HEENT: anicteric sclera, NGT in place CV: RRR Chest: CTA B Abd: distended, nontender, decreased bowel sounds   Lab Results: Recent Labs    01/26/20 0205 01/27/20 0344  NA 145 151*  K 3.2* 3.2*  CL 117* 114*  CO2 17* 25  GLUCOSE 142* 213*  BUN 12 16  CREATININE 1.03 1.04  CALCIUM 8.8* 9.2  MG 1.8 2.1  PHOS 2.6 3.4   Recent Labs    01/26/20 0205 01/27/20 0344  AST 12* 14*  ALT 13 12  ALKPHOS 76 75  BILITOT 0.7 0.5  PROT 6.2* 6.4*  ALBUMIN 2.4* 2.4*   Recent Labs    01/26/20 0205  WBC 5.1  NEUTROABS 3.2  HGB 11.1*  HCT 34.5*  MCV 78.6*  PLT 286   2L removed in last 12 hours from NGT.   Assessment/Plan: Ileus - slow to resolve. Keep potassium greater than 4. Patient want NGT out. Will have nurse hook NGT to suction at 2 pm and if output is less than 300 cc then d/c NGT and discussed with nurse. Advised him that if his symptoms worsen, then he will need NGT replaced. Continue supportive care.   [name] 01/27/2020, 11:15 AM  Questions please call [phone] ID: [name], male   DOB: Feb 07, 1961, 59 y.o.   MRN: [id number]

## 2020-01-27 NOTE — Progress Notes (Signed)
PHARMACY - TOTAL PARENTERAL NUTRITION CONSULT NOTE  Indication: Prolonged ileus  Patient Measurements: Height: 5\' 7"  (170.2 cm) Weight: 111.6 kg (246 lb 0.5 oz) IBW/kg (Calculated) : 66.1 TPN AdjBW (KG): 76.8 Body mass index is 38.53 kg/m. Usual Weight: 111 kg  Assessment:  35 YOM with PMH significant for HTN and DM, presented on 12/31/19 with aches, HTN urgency and hypokalemia.  He then developed diarrhea with abdominal distension and AKI.  Multiple imagings show gaseous dilatation.  Underwent colonoscopy for decompression on 11/24 and NG tubed was replaced on 11/28 with 1L+ of semi-feculent output.  The latest Abd XR on 11/29 showed colonic ileus pattern.  Patient was on a CMD on 11/11-11/12 and 11/16-11/21, otherwise, he was either NPO or on a CLD throughout the hospitalization.  Nutrition has been inadequate and Pharmacy consulted to manage TPN.  Patient reports no recent weight loss.  Glucose / Insulin: hx poorly controlled DM with A1c 10.6%. CBGs initially low when became NPO not trending up. CBGs ranging 138 - 205. Utilized 20 units SSI / 24 hrs. Off Lantus 10 units/day on 11/29.  Electrolytes: K 3.2 (s/p KCl 25mEq x6, K phos 83mmol x1, 14 hrs of MIVF with 40 mEq/L - total of ~179mEq of potassium). Phos 3.4 (s/p 72mmol of K Phos). Mg 2.1 (s/p 2g). Na 151. Corrected Ca 10.48. Cl 114.  Renal: nephrotic syndrome (BL SCr 1.3-1.5) - SCr 1.04, BUN WNL - Lasix held d/t hypoK LFTs / TGs: LFTs / tbili 0.5 Prealbumin / albumin: albumin 2.4. pre-albumin 12.2 Intake / Output; MIVF: extensively volume overloaded per Renal 11/28. UOP 0.3 ml/kg/hr, NG 6150 mL, net -36L, LBM 11/30 (per notes). Off MIVF since 1400 on 12/1. GI Imaging:   11/29 Abd XR: colonic ileus pattern w/o significant change 12/1 Abd XR: persistent bowel dilation, likely ileus Surgeries / Procedures: N/A  Central access: PICC placed 01/25/20 TPN start date: 01/25/20  Nutritional Goals (per RD rec 01/25/20): 2250-2450 kCal,  120-140gm protein per day, >2 L fluid  Current Nutrition:  NPO  Plan:  Increase concentrated TPN to goal rate of 80 mL/hr to provide 125g AA, 317g of dextrose, 68g SMOFlipids for a total of 2258 kcal meeting 100% of protein needs. Electrolytes in TPN: remove Na, remove Ca, continue 5 mEq/L of Mg, 15 mmol/L of Phos. Continue 75 mEq/L of K (will provide more with increased rate of TPN). Continue max acetate.  KCl IV 75mEq x5 per discussion with Dr. Cathlean Sauer  Follow up sodium - Dr. Cathlean Sauer to discuss ability for patient to drink water with surgery team Increase to resistant SSI q4h + increase to 13 units regular insulin in TPN Add standard MVI and trace elements to TPN Follow up labs tomorrow morning   Thank you for involving pharmacy in this patient's care.  Cristela Felt, PharmD Clinical Pharmacist  01/27/2020 7:43 AM  **Pharmacist phone directory can be found on [email or web address] listed under [place]**

## 2020-01-28 LAB — GLUCOSE, CAPILLARY
Glucose-Capillary: 167 mg/dL — ABNORMAL HIGH (ref 70–99)
Glucose-Capillary: 139 mg/dL — ABNORMAL HIGH (ref 70–99)
Glucose-Capillary: 116 mg/dL — ABNORMAL HIGH (ref 70–99)
Glucose-Capillary: 176 mg/dL — ABNORMAL HIGH (ref 70–99)
Glucose-Capillary: 255 mg/dL — ABNORMAL HIGH (ref 70–99)
Glucose-Capillary: 188 mg/dL — ABNORMAL HIGH (ref 70–99)

## 2020-01-28 LAB — RENAL FUNCTION PANEL
Albumin: 2.3 g/dL — ABNORMAL LOW (ref 3.5–5.0)
Anion gap: 10 (ref 5–15)
BUN: 28 mg/dL — ABNORMAL HIGH (ref 6–20)
CO2: 25 mmol/L (ref 22–32)
Calcium: 8.8 mg/dL — ABNORMAL LOW (ref 8.9–10.3)
Chloride: 114 mmol/L — ABNORMAL HIGH (ref 98–111)
Creatinine, Ser: 1.22 mg/dL (ref 0.61–1.24)
GFR, Estimated: >60 mL/min (ref >60)
Glucose, Bld: 147 mg/dL — ABNORMAL HIGH (ref 70–99)
Phosphorus: 3.4 mg/dL (ref 2.5–4.6)
Potassium: 3.3 mmol/L — ABNORMAL LOW (ref 3.5–5.1)
Sodium: 149 mmol/L — ABNORMAL HIGH (ref 135–145)

## 2020-01-28 LAB — MAGNESIUM: Magnesium: 2 mg/dL (ref 1.7–2.4)

## 2020-01-28 MED ORDER — POTASSIUM CHLORIDE 10 MEQ/50ML IV SOLN
10.0000 meq | INTRAVENOUS | Status: AC
  Administered 2020-01-28 (×6): 10 meq via INTRAVENOUS
  Filled 2020-01-28 (×6): qty 50

## 2020-01-28 MED ORDER — BOOST / RESOURCE BREEZE PO LIQD CUSTOM
1.0000 | Freq: Two times a day (BID) | ORAL | Status: DC
  Administered 2020-01-29 – 2020-01-31 (×3): 1 via ORAL

## 2020-01-28 MED ORDER — PROMETHAZINE HCL 25 MG/ML IJ SOLN: 25.0000 mg | INTRAMUSCULAR | Status: DC | PRN

## 2020-01-28 MED ORDER — TRACE MINERALS CU-MN-SE-ZN 300-55-60-3000 MCG/ML IV SOLN
INTRAVENOUS | Status: AC
  Filled 2020-01-28: qty 416

## 2020-01-28 MED ORDER — TRACE MINERALS CU-MN-SE-ZN 300-55-60-3000 MCG/ML IV SOLN
INTRAVENOUS | Status: DC
  Filled 2020-01-28: qty 832

## 2020-01-28 MED ORDER — SUCRALFATE 1 GM/10ML PO SUSP
1.0000 g | Freq: Three times a day (TID) | ORAL | Status: DC
  Administered 2020-01-28 – 2020-01-31 (×9): 1 g via ORAL
  Filled 2020-01-28 (×14): qty 10

## 2020-01-28 NOTE — Progress Notes (Signed)
Foley cath removed per order. Pt tolerated well. Tip inact. 1L drained

## 2020-01-28 NOTE — Progress Notes (Signed)
Nutrition Follow-up  DOCUMENTATION CODES:   Obesity unspecified, Non-severe (moderate) malnutrition in context of acute illness/injury  INTERVENTION:   -TPN management per pharmacy -Boost Breeze po BID, each supplement provides 250 kcal and 9 grams of protein -RD will follow for diet advancement and adjust supplement regimen as appropriate  NUTRITION DIAGNOSIS:   Moderate Malnutrition related to acute illness (colonic ileus) as evidenced by energy intake < or equal to 50% for > or equal to 5 days, mild fat depletion, mild muscle depletion, edema.  Ongoing  GOAL:   Patient will meet greater than or equal to 90% of their needs  Met with TPN  MONITOR:   PO intake, Supplement acceptance, Labs, Skin  REASON FOR ASSESSMENT:   LOS    ASSESSMENT:   59 yo male admitted with mild rhabdomyolysis, nephrotic syndrome, AKI. PMH includes HTN, DM, Bipolar affective disorder, CHF, chronic leukopenia, CVA, substance abuse (cocaine).  11/18- abdominal x-ray revealspersistent diffuse gaseous colonic distention 11/21- s/p repeat abdominal x-ray- revealscolonic pseudo-obstruction or ileus and not a mechanical obstruction, NGT placed, NPO 11/24- s/p colonoscopy for decompression, advanced to clear liquid diet 11/28- NGT placed for decompression 11/29- PICC placed 11/30 TPN initiated 12/2- NGT d/c 12/3- advanced to clear liquid diet  Reviewed I/O's: +472 ml x 24 hours and -24.2 L since 01/14/20  UOP: 700 ml x 24 hours  Pt sleeping soundly, snoring audibly at time of visit.   Pt just advanced to clear liquid diet.   Per pharmacy note, pt currently receiving TPN at goal rate of 80 mL/hr to provide 2258 kcals and 125 grams protein, meeting 100% of estimated kcal and protein needs. Plan to decrease TPN to half this evening at 1800: TPN @ 40 ml/hr, which will provides 1128 kcals and 64 grams protein, meeting 50% of estimated kcal needs and 53% of estimated protein needs.   Medications  reviewed and include aldactone.   Labs reviewed: Na: 149, Mg and Phos WDL. CBGS: 116-176 (inpatient orders for glycemic control are 0-20 units insulin aspart every 4 hours).   Diet Order:   Diet Order            Diet clear liquid Room service appropriate? Yes; Fluid consistency: Thin  Diet effective now                 EDUCATION NEEDS:   Not appropriate for education at this time  Skin:  Skin Assessment: Reviewed RN Assessment (open wound to penis)  Last BM:  11/31/21  Height:   Ht Readings from Last 1 Encounters:  01/19/20 _0  (1.702 m)    Weight:   Wt Readings from Last 1 Encounters:  01/25/20 111.6 kg    Ideal Body Weight:  70 kg  BMI:  Body mass index is 38.53 kg/m.  Estimated Nutritional Needs:   Kcal:  5945-8592  Protein:  120-140 grams  Fluid:  > 2 L    Loistine Chance, RD, LDN, [place] Registered Dietitian II Certified Diabetes Care and Education Specialist Please refer to [facility] for RD and/or RD on-call/weekend/after hours pager

## 2020-01-28 NOTE — Plan of Care (Signed)
  Problem: Education: Goal: Knowledge of General Education information will improve Description: Including pain rating scale, medication(s)/side effects and non-pharmacologic comfort measures 01/28/2020 0313 by Guinevere Scarlet, RN Outcome: Progressing 01/28/2020 0311 by Guinevere Scarlet, RN Outcome: Progressing   Problem: Health Behavior/Discharge Planning: Goal: Ability to manage health-related needs will improve 01/28/2020 0313 by Guinevere Scarlet, RN Outcome: Progressing 01/28/2020 0311 by Guinevere Scarlet, RN Outcome: Progressing   Problem: Clinical Measurements: Goal: Ability to maintain clinical measurements within normal limits will improve 01/28/2020 0313 by Guinevere Scarlet, RN Outcome: Progressing 01/28/2020 0311 by Guinevere Scarlet, RN Outcome: Progressing

## 2020-01-28 NOTE — Progress Notes (Signed)
[facility] Gastroenterology Progress Note  [name] 59 y.o. [birth year]   Subjective: Reports several loose stools overnight but nothing documented in flowsheets. Denies abdominal pain. NG removed by staff yesterday. Wants to go home.  Objective: Vital signs: Vitals:   01/27/20 2015 01/28/20 0446  BP: (!) 144/83 136/79  Pulse: (!) 101 100  Resp: 18 18  Temp: 98.9 F (37.2 C) 98.2 F (36.8 C)  SpO2: 99% 100%    Physical Exam: Gen: alert, no acute distress, ill-appearing  HEENT: anicteric sclera CV: RRR Chest: CTA B Abd: less distended, nontender, +BS Ext: no edema  Lab Results: Recent Labs    01/27/20 0344 01/28/20 0448  NA 151* 149*  K 3.2* 3.3*  CL 114* 114*  CO2 25 25  GLUCOSE 213* 147*  BUN 16 28*  CREATININE 1.04 1.22  CALCIUM 9.2 8.8*  MG 2.1 2.0  PHOS 3.4 3.4   Recent Labs    01/26/20 0205 01/26/20 0205 01/27/20 0344 01/28/20 0448  AST 12*  --  14*  --   ALT 13  --  12  --   ALKPHOS 76  --  75  --   BILITOT 0.7  --  0.5  --   PROT 6.2*  --  6.4*  --   ALBUMIN 2.4*   < > 2.4* 2.3*   < > = values in this interval not displayed.   Recent Labs    01/26/20 0205  WBC 5.1  NEUTROABS 3.2  HGB 11.1*  HCT 34.5*  MCV 78.6*  PLT 286      Assessment/Plan: Prolonged ileus with low potassium - Xray yesterday with stable small bowel dilation. Reports BMs but none documented. Informed nurse need for accurate documentation of his stools. NGT output minimal yesterday afternoon so discontinued NG tube. Slowly advance diet. Potassium needs to be kept greater than 4. Needs PT, which will help with ileus by being less sedentary. D/C per primary team. Will sign off. Call if questions.   [name] 01/28/2020, 8:23 AM  Questions please call [phone] ID: [name], male   DOB: 1961/01/22, 59 y.o.   MRN: [id number]

## 2020-01-28 NOTE — Progress Notes (Signed)
Patient ID: [name], male   DOB: December 20, 1960, 59 y.o.   MRN: [id number] S: Feels better and reports several bowel movements, but none documented.  NGT out. O:BP (!) 155/98   Pulse (!) 102   Temp 98.2 F (36.8 C) (Oral)   Resp 19   Ht 5\' 7"  (1.702 m)   Wt 111.6 kg   SpO2 100%   BMI 38.53 kg/m   Intake/Output Summary (Last 24 hours) at 01/28/2020 1243 Last data filed at 01/28/2020 1020 Gross per 24 hour  Intake 1184.57 ml  Output 700 ml  Net 484.57 ml   Intake/Output: I/O last 3 completed shifts: In: 2926.6 [P.O.:1080; I.V.:1571.5; IV Piggyback:275.1] Out: 3450 [Urine:1450; Emesis/NG output:2000]  Intake/Output this shift:  Total I/O In: 13 [I.V.:13] Out: -  Weight change:  Gen: NAD CVS: tachy at 102 Resp:CTA Abd: Distended, hypoactive bowel sounds, + tenderness to palpation Ext: 1+ brawny edema bilaterally  Recent Labs  Lab 01/22/20 0206 01/22/20 1526 01/23/20 0310 01/23/20 0310 01/23/20 1451 01/24/20 0330 01/24/20 1525 01/25/20 0400 01/26/20 0205 01/27/20 0344 01/28/20 0448  NA 138   < > 141   < > 141 143 145 145 145 151* 149*  K 3.0*   < > 2.7*   < > 2.9* 2.8* 3.5 3.3* 3.2* 3.2* 3.3*  CL 110   < > 113*   < > 115* 118* 117* 115* 117* 114* 114*  CO2 16*   < > 16*   < > 16* 16* 17* 17* 17* 25 25  GLUCOSE 205*   < > 126*   < > 118* 91 111* 118* 142* 213* 147*  BUN 15   < > 20   < > 21* 19 17 16 12 16  28*  CREATININE 1.70*   < > 1.70*   < > 1.43* 1.24 1.23 1.13 1.03 1.04 1.22  ALBUMIN 2.7*  --  2.8*  --   --  2.4*  --  2.5* 2.4* 2.4* 2.3*  CALCIUM 8.7*   < > 9.0   < > 8.6* 8.7* 9.1 8.8* 8.8* 9.2 8.8*  PHOS 2.0*  --  2.5  --   --  2.7  --  2.9 2.6 3.4 3.4  AST  --   --   --   --   --   --   --   --  12* 14*  --   ALT  --   --   --   --   --   --   --   --  13 12  --    < > = values in this interval not displayed.   Liver Function Tests: Recent Labs  Lab 01/26/20 0205 01/27/20 0344 01/28/20 0448  AST 12* 14*  --   ALT 13 12  --   ALKPHOS 76 75  --    BILITOT 0.7 0.5  --   PROT 6.2* 6.4*  --   ALBUMIN 2.4* 2.4* 2.3*   No results for input(s): LIPASE, AMYLASE in the last 168 hours. No results for input(s): AMMONIA in the last 168 hours. CBC: Recent Labs  Lab 01/26/20 0205  WBC 5.1  NEUTROABS 3.2  HGB 11.1*  HCT 34.5*  MCV 78.6*  PLT 286   Cardiac Enzymes: No results for input(s): CKTOTAL, CKMB, CKMBINDEX, TROPONINI in the last 168 hours. CBG: Recent Labs  Lab 01/27/20 2016 01/28/20 0103 01/28/20 0441 01/28/20 0807 01/28/20 1115  GLUCAP 233* 167* 139* 116* 176*    Iron  Studies: No results for input(s): IRON, TIBC, TRANSFERRIN, FERRITIN in the last 72 hours. Studies/Results: DG Abd 1 View  Result Date: 01/27/2020 CLINICAL DATA:  Ileus. EXAM: ABDOMEN - 1 VIEW COMPARISON:  January 26, 2020. FINDINGS: Stable small bowel dilatation is noted concerning for ileus. Nasogastric tube is seen in proximal stomach. No radio-opaque calculi or other significant radiographic abnormality are seen. IMPRESSION: Stable small bowel dilatation is noted concerning for ileus. Electronically Signed   By: Marijo Conception M.D.   On: 01/27/2020 15:30   . amLODipine  10 mg Oral Daily  . Chlorhexidine Gluconate Cloth  6 each Topical Daily  . enoxaparin (LOVENOX) injection  55 mg Subcutaneous Daily  . feeding supplement  1 Container Oral BID BM  . hydrALAZINE  10 mg Intravenous Q4H  . insulin aspart  0-20 Units Subcutaneous Q4H  . sodium chloride flush  10-40 mL Intracatheter Q12H  . sodium chloride flush  3 mL Intravenous Q12H  . sodium chloride flush  3 mL Intravenous Q12H  . spironolactone  50 mg Oral Daily  . tamsulosin  0.8 mg Oral Daily    BMET    Component Value Date/Time   NA 149 (H) 01/28/2020 0448   NA 135 (L) 12/31/2013 1336   K 3.3 (L) 01/28/2020 0448   K 3.8 12/31/2013 1336   CL 114 (H) 01/28/2020 0448   CL 101 12/31/2013 1336   CO2 25 01/28/2020 0448   CO2 25 12/31/2013 1336   GLUCOSE 147 (H) 01/28/2020 0448   GLUCOSE  249 (H) 12/31/2013 1336   BUN 28 (H) 01/28/2020 0448   BUN 8 12/31/2013 1336   CREATININE 1.22 01/28/2020 0448   CREATININE 0.88 12/31/2013 1336   CALCIUM 8.8 (L) 01/28/2020 0448   CALCIUM 8.6 12/31/2013 1336   GFRNONAA >60 01/28/2020 0448   GFRNONAA >60 12/31/2013 1336   GFRAA >60 01/29/2018 1705   GFRAA >60 12/31/2013 1336   CBC    Component Value Date/Time   WBC 5.1 01/26/2020 0205   RBC 4.39 01/26/2020 0205   HGB 11.1 (L) 01/26/2020 0205   HGB 13.9 12/31/2013 1336   HCT 34.5 (L) 01/26/2020 0205   HCT 43.0 12/31/2013 1336   PLT 286 01/26/2020 0205   PLT 184 12/31/2013 1336   MCV 78.6 (L) 01/26/2020 0205   MCV 81 12/31/2013 1336   MCH 25.3 (L) 01/26/2020 0205   MCHC 32.2 01/26/2020 0205   RDW 16.2 (H) 01/26/2020 0205   RDW 14.4 12/31/2013 1336   LYMPHSABS 1.2 01/26/2020 0205   MONOABS 0.6 01/26/2020 0205   EOSABS 0.1 01/26/2020 0205   BASOSABS 0.0 01/26/2020 0205      Assessment/Plan:  1. AKI/CKD stage IIIb- due to diuresis in setting of anasarca/nephrotic syndrome related to diabetic nephropathy. Scr back at baseline and restarted diuretics.  1. Off of ACE/ARB for now. 2. Continue aldactone 50 mg daily 3. Scr back at baseline 4. Would add Farxiga for renoprotection but can wait for outpatient follow up. 5. Nothing more to add at this time and will sign off.  Please call with questions or concerns. 2. Anasarca/nephrotic syndrome as well as 3rd spacing from hypoalbuminemia and ileus. 1. Lasix on hold due to profound hypokalemiabut continues to diurese and volume markedly improved. 2. Ok to try po lasix 80 mg daily (or 40 mg IV if not taking po) once K is 4 or greater 3. Hypokalemia- continue to replete andcontinuealdactone 50 mg daily 1. redose KCl runs of 10 mEq x 5.  2. Continue to follow and goal for K >4 4. Ileus vs early partial small bowel obstruction. GI consulted and now with NGT. 1. Continues to have large amounts of NG tube drainage (4  liters/day) 2. No significant improvement of ileus. 5. Hypernatremia- improved with 1/2NS and continue for now until taking po. 6. Anemia of CKD stage 3- cont to follow H/H. 7. HTN- on hydralazine 100 mg q6, aldactone 50 mg daily, and amlodipine 10 mg daily. Off of dilt and atenolol but could add labetalol 200 mg tidor carvediloland follow. Off benazepril due to AKI.  8. DM type 2 - per primary, uncontrolled. 9. Disposition- he will need follow up in our office 2-3 weeks after discharge  [name], MD [facility] [phone]

## 2020-01-28 NOTE — Progress Notes (Signed)
[facility] ([facility] Community Based Palliative Care       This patient has been referred to our palliative care services in the community.  [facility] will continue to follow for any discharge planning needs and to coordinate admission onto palliative care.   If you have questions or need assistance, please call [phone] or contact the hospital Liaison listed on AMION.     Thank you for the opportunity to participate in this patient's care.     Chrislyn King, BSN, RN [facility] Hospital Liaison   [phone] (24h on call) 

## 2020-01-28 NOTE — Progress Notes (Addendum)
PHARMACY - TOTAL PARENTERAL NUTRITION CONSULT NOTE  Indication: Prolonged ileus  Patient Measurements: Height: 5\' 7"  (170.2 cm) Weight: 111.6 kg (246 lb 0.5 oz) IBW/kg (Calculated) : 66.1 TPN AdjBW (KG): 76.8 Body mass index is 38.53 kg/m. Usual Weight: 111 kg  Assessment:  54 YOM with PMH significant for HTN and DM, presented on 12/31/19 with aches, HTN urgency and hypokalemia.  He then developed diarrhea with abdominal distension and AKI, with a working diagnosis of nephrotic syndrome. Multiple imagings show gaseous dilatation.  Underwent colonoscopy for decompression on 11/24 and NG tubed was replaced on 11/28 with 1L+ of semi-feculent output.  The latest Abd XR on 11/29 showed colonic ileus pattern.  Patient was on a CMD on 11/11-11/12 and 11/16-11/21, otherwise, he was either NPO or on a CLD throughout the hospitalization.  Nutrition has been inadequate and Pharmacy consulted to manage TPN.  Patient reports no recent weight loss.  Glucose / Insulin: hx poorly controlled DM with A1c 10.6%. CBGs initially low when became NPO not trending up. CBGs 139 - 233 . Utilized 28 units SSI / 24 hrs and started 13 units in TPN at 1800.  Electrolytes: Na 149, K 3.3 (s/p 50 mEq IV and 144 mEq in TPN). Phos 3.4. Mg 2. Corrected Ca 10.16. Cl 114.  Renal: nephrotic syndrome Scr 1.22 (BL SCr 1.3-1.5), BUN up to 28- off furosemide due to hypokalemia, on spironolactone 50mg  daily   LFTs / TGs: LFTs wnl,  tbili 0.5 Prealbumin / albumin: albumin 2.3, pre-albumin 12.2 Intake / Output; MIVF: extensively volume overloaded. On half NS @ 50 ml/hr starting 12/2 for hypernatremia. UOP 0.3 ml/kg/hr, NG removed, LBM documented 11/30 GI Imaging:   11/29 Abd XR: colonic ileus pattern w/o significant change 12/1 Abd XR: persistent bowel dilation, likely ileus Surgeries / Procedures: N/A  Central access: PICC placed 01/25/20 TPN start date: 01/25/20  Nutritional Goals (per RD rec 01/25/20): 2250-2450 kCal,  120-140gm protein per day, >2 L fluid  Current Nutrition:  Per MD will advance diet 12/3   Plan:  Per discussion with MD, decrease to half concentrated TPN at goal rate of 40 mL/hr to provide 64g AA, 158g of dextrose, 34g SMOFlipids for a total of 1128 kcal meeting 50% of protein needs. Electrolytes in TPN: continue 0 Na, 0 Ca, 5 mEq/L of Mg, 15 mmol/L of Phos, max acetate. Increase to 95 mEq/L of K  Give concentrated KCl IV 3mEq x6, confirmed with RN pt is on tele monitoring Follow up sodium - on Half NS at 50 ml/hr per team  Continue resistant SSI q4h + remove regular insulin in TPN  Add standard MVI and trace elements to TPN Follow up labs tomorrow morning   Thank you for involving pharmacy in this patient's care.   Benetta Spar, PharmD, BCPS, BCCP Clinical Pharmacist  Please check AMION for all [place] phone numbers After 10:00 PM, call [place] [phone]

## 2020-01-28 NOTE — Progress Notes (Addendum)
   01/28/20 1403  Vitals  BP (!) 153/95  Pulse Rate (!) 105  MEWS COLOR  MEWS Score Color Green  Oxygen Therapy  SpO2 99 %  O2 Device Room Air  MEWS Score  MEWS Temp 0  MEWS Systolic 0  MEWS Pulse 1  MEWS RR 0  MEWS LOC 0  MEWS Score 1    Pt with complaints of nausea and has about 100 ml of clear vomit. PRN antiemetic administered. Pt also complaining of chest but unable to describe and unsure if associated with vomiting. MD notified. Will order STAT EKG.

## 2020-01-28 NOTE — Progress Notes (Signed)
PROGRESS NOTE    [name]  HLK:[id number] DOB: 06-Apr-1960 DOA: 12/31/2019 PCP: [name], NP    Brief Narrative:  Mr. Karge was admitted to the hospital with a working diagnosis of nephrotic syndrome. Prolonged hospital stay, complicated by acute kidney injury, severe hypokalemia and ileus  59 year old male with past medical history for hypertension, type 2 diabetes mellitus, history of CVA,substance abuse and chronic leukopenia.Patient reported cramping pain in his lower extremities, he recently moved from [place] and has been not taking his medications. On his initial physical examination his blood pressure was 219/121, heart rate 84, respiratory 20, temperature 98, oxygen saturation 100%, his lungs were clear to auscultation bilaterally, heart S1-S2, present rhythmic, soft abdomen, positive bilateral pitting lower extremity edema up to the hips, Sodium 136, potassium 2.5, chloride 105, bicarb 24, glucose 252, BUN 10, creatinine 1.07. White count 3.5, hemoglobin 11.0, hematocrit 34.6, platelets 266. SARS COVID-19 negative. Ck 970-568 Urinalysis more than 500 glucose, more than 300 protein, specific gravity 1.025. EKG 90 bpm, normal axis, normal intervals, sinus rhythm, poor R wave progression, positive LVH, no ST segment T wave changes.  He was diagnosed with nephrotic syndrome, positive anasarca, he was placed on continuous infusion of furosemide with improvement of volume status. Unfortunately had worsening kidney function that prevented further diuresis.  He has developed profound hypokalemia and now complicated with ileus. Patient has required an NG tube for low intermittent suction.  11/24 patient underwent colonoscopy finding colonic dilatation but no frank obstruction, poor bowel preparation.   Persistent abdominal distention.   Assessment & Plan:   Principal Problem:   Generalized body aches Active Problems:   Hypertension   Substance-related  disorder (HCC)   Hypokalemia   Non-traumatic rhabdomyolysis   Grade I diastolic dysfunction   Anemia   Hypokalemia due to loss of potassium   Nephrotic syndrome   Anasarca   Palliative care by specialist   Goals of care, counseling/discussion   Malnutrition of moderate degree    1. Nephrotic syndrome/ hypokalemia/hypophosphatemia AKI on CKD 3b.  3+, pitting edema lower extremtities.  Documented urine output over last 24 hr is 700 ml. Serum cr is 1.22, K is 3,3, CL 114, bicarb 25 and Na 149, Mg 2.0 and P 3,4.    Continue diuresis with spironolactone 20 mg po bid.  K correction with Kcl 60 meq IV today, follow up on renal function and electrolytes. Patient on 0,45% NS at 50 ml per hr for hypernatremia.   Plan to decrease TPN to 50% per protocol then discontinue.   2. Ileus. patient with no vomiting or abdominal pain, NG tube has been removed and per his report he continue to have bowel movements and flatus.  Films reviewed with persistent bowel distention. Trial to advance diet to claers and then soft. Continue as needed antiemetics.   3. HTNContinue blood pressure control with amlodipine 10 mg, hydralazine and spironolactone.  Today blood pressure 155/98 mmHg.  4. Chronic anemia. hgb has been stable.    5. Moderate calorie protein malnutrition. advance diet to soft, decerase TPN to 50% per protocol. Out of bed to chair tid with meals.   6. Metabolic encephalopathy. No further agitation or confusion, encephalopathy has resolved.   7. T2DM uncontrolled with Hgb A1c 10.6. fasting glucose this am 147, will continue sliding scale for glucose cover and monitoring.   Status is: Inpatient  Remains inpatient appropriate because:IV treatments appropriate due to intensity of illness or inability to take PO   Dispo:  The patient is from: Home              Anticipated d/c is to: Home              Anticipated d/c date is: 1 day              Patient currently is not medically  stable to d/c.   DVT prophylaxis: Enoxaparin   Code Status:   full  Family Communication:  No family at the bedside      Nutrition Status: Nutrition Problem: Moderate Malnutrition Etiology: acute illness (colonic ileus) Signs/Symptoms: energy intake < or equal to 50% for > or equal to 5 days, mild fat depletion, mild muscle depletion, edema Interventions: Ensure Enlive (each supplement provides 350kcal and 20 grams of protein), MVI     Consultants:   Nephrology   GI   Surgery   Procedures:   Colonoscopy      Subjective: Patient seating at the edge of the bed, NG tube has been removed, no nausea or vomiting, no chest pain or dyspnea. Reports chronic edema lower extremities. Reports positive bowel movements,   Objective: Vitals:   01/27/20 1328 01/27/20 2015 01/28/20 0446 01/28/20 0731  BP: 127/80 (!) 144/83 136/79 138/82  Pulse: 97 (!) 101 100 100  Resp: 18 18 18 19   Temp: 98.2 F (36.8 C) 98.9 F (37.2 C) 98.2 F (36.8 C) 98.2 F (36.8 C)  TempSrc: Oral Oral Oral Oral  SpO2: 100% 99% 100% 100%  Weight:      Height:        Intake/Output Summary (Last 24 hours) at 01/28/2020 0954 Last data filed at 01/28/2020 0449 Gross per 24 hour  Intake 1171.57 ml  Output 700 ml  Net 471.57 ml   Filed Weights   01/23/20 0500 01/24/20 0500 01/25/20 0500  Weight: 115 kg 113.9 kg 111.6 kg    Examination:   General: Not in pain or dyspnea, deconditioned  Neurology: Awake and alert, non focal  E ENT: mild pallor, no icterus, oral mucosa moist Cardiovascular: No JVD. S1-S2 present, rhythmic, no gallops, rubs, or murmurs. +++ pitting up to he hips  lower extremity edema. Pulmonary: positive breath sounds bilaterally, with no wheezing, rhonchi or rales. Gastrointestinal. Abdomen distended and tympanic,  Skin. No rashes Musculoskeletal: no joint deformities     Data Reviewed: I have personally reviewed following labs and imaging studies  CBC: Recent Labs  Lab  01/26/20 0205  WBC 5.1  NEUTROABS 3.2  HGB 11.1*  HCT 34.5*  MCV 78.6*  PLT 664   Basic Metabolic Panel: Recent Labs  Lab 01/22/20 1526 01/23/20 0310 01/24/20 0330 01/24/20 0330 01/24/20 1525 01/25/20 0400 01/26/20 0205 01/27/20 0344 01/28/20 0448  NA 140   < > 143   < > 145 145 145 151* 149*  K 2.5*   < > 2.8*   < > 3.5 3.3* 3.2* 3.2* 3.3*  CL 115*   < > 118*   < > 117* 115* 117* 114* 114*  CO2 16*   < > 16*   < > 17* 17* 17* 25 25  GLUCOSE 142*   < > 91   < > 111* 118* 142* 213* 147*  BUN 19   < > 19   < > 17 16 12 16  28*  CREATININE 1.73*   < > 1.24   < > 1.23 1.13 1.03 1.04 1.22  CALCIUM 8.8*   < > 8.7*   < > 9.1 8.8* 8.8*  9.2 8.8*  MG 1.9  --   --   --  2.2  --  1.8 2.1 2.0  PHOS  --    < > 2.7  --   --  2.9 2.6 3.4 3.4   < > = values in this interval not displayed.   GFR: Estimated Creatinine Clearance: 78.7 mL/min (by C-G formula based on SCr of 1.22 mg/dL). Liver Function Tests: Recent Labs  Lab 01/24/20 0330 01/25/20 0400 01/26/20 0205 01/27/20 0344 01/28/20 0448  AST  --   --  12* 14*  --   ALT  --   --  13 12  --   ALKPHOS  --   --  76 75  --   BILITOT  --   --  0.7 0.5  --   PROT  --   --  6.2* 6.4*  --   ALBUMIN 2.4* 2.5* 2.4* 2.4* 2.3*   No results for input(s): LIPASE, AMYLASE in the last 168 hours. No results for input(s): AMMONIA in the last 168 hours. Coagulation Profile: No results for input(s): INR, PROTIME in the last 168 hours. Cardiac Enzymes: No results for input(s): CKTOTAL, CKMB, CKMBINDEX, TROPONINI in the last 168 hours. BNP (last 3 results) No results for input(s): PROBNP in the last 8760 hours. HbA1C: No results for input(s): HGBA1C in the last 72 hours. CBG: Recent Labs  Lab 01/27/20 1608 01/27/20 2016 01/28/20 0103 01/28/20 0441 01/28/20 0807  GLUCAP 189* 233* 167* 139* 116*   Lipid Profile: Recent Labs    01/26/20 0205  TRIG 71   Thyroid Function Tests: No results for input(s): TSH, T4TOTAL, FREET4, T3FREE,  THYROIDAB in the last 72 hours. Anemia Panel: No results for input(s): VITAMINB12, FOLATE, FERRITIN, TIBC, IRON, RETICCTPCT in the last 72 hours.    Radiology Studies: I have reviewed all of the imaging during this hospital visit personally     Scheduled Meds: . amLODipine  10 mg Oral Daily  . Chlorhexidine Gluconate Cloth  6 each Topical Daily  . enoxaparin (LOVENOX) injection  55 mg Subcutaneous Daily  . hydrALAZINE  10 mg Intravenous Q4H  . insulin aspart  0-20 Units Subcutaneous Q4H  . sodium chloride flush  10-40 mL Intracatheter Q12H  . sodium chloride flush  3 mL Intravenous Q12H  . sodium chloride flush  3 mL Intravenous Q12H  . spironolactone  50 mg Oral Daily  . tamsulosin  0.8 mg Oral Daily   Continuous Infusions: . sodium chloride 50 mL/hr at 01/27/20 1433  . sodium chloride Stopped (01/20/20 1900)  . potassium chloride    . TPN ADULT (ION) 80 mL/hr at 01/27/20 1744  . TPN ADULT (ION)       LOS: 24 days        Rally Ouch Gerome Apley, MD

## 2020-01-28 NOTE — Plan of Care (Signed)

## 2020-01-29 LAB — GLUCOSE, CAPILLARY
Glucose-Capillary: 103 mg/dL — ABNORMAL HIGH (ref 70–99)
Glucose-Capillary: 181 mg/dL — ABNORMAL HIGH (ref 70–99)
Glucose-Capillary: 200 mg/dL — ABNORMAL HIGH (ref 70–99)
Glucose-Capillary: 209 mg/dL — ABNORMAL HIGH (ref 70–99)
Glucose-Capillary: 231 mg/dL — ABNORMAL HIGH (ref 70–99)
Glucose-Capillary: 233 mg/dL — ABNORMAL HIGH (ref 70–99)
Glucose-Capillary: 259 mg/dL — ABNORMAL HIGH (ref 70–99)

## 2020-01-29 LAB — RENAL FUNCTION PANEL
Albumin: 2.5 g/dL — ABNORMAL LOW (ref 3.5–5.0)
Anion gap: 10 (ref 5–15)
BUN: 38 mg/dL — ABNORMAL HIGH (ref 6–20)
CO2: 22 mmol/L (ref 22–32)
Calcium: 8.6 mg/dL — ABNORMAL LOW (ref 8.9–10.3)
Chloride: 112 mmol/L — ABNORMAL HIGH (ref 98–111)
Creatinine, Ser: 1.32 mg/dL — ABNORMAL HIGH (ref 0.61–1.24)
GFR, Estimated: >60 mL/min (ref >60)
Glucose, Bld: 296 mg/dL — ABNORMAL HIGH (ref 70–99)
Phosphorus: 3.8 mg/dL (ref 2.5–4.6)
Potassium: 3.9 mmol/L (ref 3.5–5.1)
Sodium: 144 mmol/L (ref 135–145)

## 2020-01-29 LAB — MAGNESIUM: Magnesium: 2 mg/dL (ref 1.7–2.4)

## 2020-01-29 MED ORDER — PANTOPRAZOLE SODIUM 40 MG PO TBEC
40.0000 mg | DELAYED_RELEASE_TABLET | Freq: Every day | ORAL | Status: DC
  Administered 2020-01-29 – 2020-01-31 (×3): 40 mg via ORAL
  Filled 2020-01-29 (×3): qty 1

## 2020-01-29 MED ORDER — POTASSIUM CHLORIDE CRYS ER 20 MEQ PO TBCR
40.0000 meq | EXTENDED_RELEASE_TABLET | Freq: Once | ORAL | Status: AC
  Administered 2020-01-29: 40 meq via ORAL
  Filled 2020-01-29: qty 2

## 2020-01-29 NOTE — Plan of Care (Signed)
  Problem: Safety: Goal: Ability to remain free from injury will improve Outcome: Progressing   

## 2020-01-29 NOTE — Progress Notes (Signed)
PROGRESS NOTE    TAGGART PRASAD  DXI:[id number] DOB: 03/26/1960 DOA: 12/31/2019 PCP: [name], NP    Brief Narrative:  Mr. Landstrom was admitted to the hospital with a working diagnosis of nephrotic syndrome. Prolonged hospital stay, complicated by acute kidney injury, severe hypokalemia and ileus  59 year old male with past medical history for hypertension, type 2 diabetes mellitus, history of CVA,substance abuse and chronic leukopenia.Patient reported cramping pain in his lower extremities, he recently moved from [place] and has been not taking his medications. On his initial physical examination his blood pressure was 219/121, heart rate 84, respiratory 20, temperature 98, oxygen saturation 100%, his lungs were clear to auscultation bilaterally, heart S1-S2, present rhythmic, soft abdomen, positive bilateral pitting lower extremity edema up to the hips, Sodium 136, potassium 2.5, chloride 105, bicarb 24, glucose 252, BUN 10, creatinine 1.07. White count 3.5, hemoglobin 11.0, hematocrit 34.6, platelets 266. SARS COVID-19 negative. Ck 970-568 Urinalysis more than 500 glucose, more than 300 protein, specific gravity 1.025. EKG 90 bpm, normal axis, normal intervals, sinus rhythm, poor R wave progression, positive LVH, no ST segment T wave changes.  He was diagnosed with nephrotic syndrome, positive anasarca, he was placed on continuous infusion of furosemide with improvement of volume status. Unfortunately had worsening kidney function that prevented further diuresis.  He has developed profound hypokalemia and now complicated with ileus. Patient has required an NG tube for low intermittent suction.  11/24 patient underwent colonoscopy finding colonic dilatation but no frank obstruction, poor bowel preparation.   Persistent abdominal distention. Ng tube has been removed and slowly advancing diet.    Assessment & Plan:   Principal Problem:   Generalized body  aches Active Problems:   Hypertension   Substance-related disorder (HCC)   Hypokalemia   Non-traumatic rhabdomyolysis   Grade I diastolic dysfunction   Anemia   Hypokalemia due to loss of potassium   Nephrotic syndrome   Anasarca   Palliative care by specialist   Goals of care, counseling/discussion   Malnutrition of moderate degree   1. Nephrotic syndrome/ hypokalemia/hypophosphatemia AKI on CKD 3b.continue to have lower extremity edema from the hips down 3+ pitting.  Foley catheter has been removed, and his urine output over last 24 hr has been 1,200 ml. Na is 144, K 3,9, bicarbonate 22, bun 38 and cr at 1,32, Mg at 2 and P 3,8.  Tolerating well clears, will advance to soft, discontinue IV fluids and TPN, plan to follow up on renal function in am. Plan to resume furosemide when K is more stable 4 or greater. Add 40 kcl liquid x1. Continue with spironolactone 50 mg daily.  On tamsulosin for BPH.   2. Ileus.abdominal films with persistent bowel distention. Patient has no NG tube in place, had vomiting yesterday but not overnight, this am is tolerating clears, he reports positive bowel movements.  Advance diet to soft and continue as needed antiemetics, if recurrent nausea and vomiting, may need to replace NG tube.  Continue with antiacids sucralfate and will add pantoprazole.  Out of bed to chair tid with meals.   3. HTNblood pressure control with amlodipine, metoprolol and spironolactone.   4. Chronic anemia. his cell count has been stable.   5. Moderate calorie protein malnutrition.will discontinue TPN and will continu with soft diet.   6. Metabolic encephalopathy. clinically resolved, no confusion or agitation.   7. T2DM uncontrolled with Hgb A1c 10.6. fasting glucose 296 with capillry 188, 209, 259 mg/dl.  Continue sliding scale insulin,  will hold on basal insulin for now, due to inconsistent po intake and risk of hypoglycemia.     Status is:  Inpatient  Remains inpatient appropriate because:Inpatient level of care appropriate due to severity of illness   Dispo: The patient is from: Home              Anticipated d/c is to: Home              Anticipated d/c date is: 1 day              Patient currently is not medically stable to d/c.   DVT prophylaxis: Enoxaparin   Code Status:   full  Family Communication:  No family at the bedside.      Nutrition Status: Nutrition Problem: Moderate Malnutrition Etiology: acute illness (colonic ileus) Signs/Symptoms: energy intake < or equal to 50% for > or equal to 5 days, mild fat depletion, mild muscle depletion, edema Interventions: Ensure Enlive (each supplement provides 350kcal and 20 grams of protein), MVI     Skin Documentation:     Consultants:   Nephrology   Surgery   GI   Procedures:   Colonoscopy      Subjective: Patient continue to have abdominal distention, reports positive bowel movements and flatus. Has nausea and vomiting, yesterday, but not today. Foley catheter has been removed with good toleration. Patient is anxious to go home.   Objective: Vitals:   01/28/20 1403 01/28/20 1500 01/28/20 1934 01/29/20 0457  BP: (!) 153/95 (!) 147/87 (!) 144/86 137/89  Pulse: (!) 105 99 (!) 102 98  Resp:  17 17 17   Temp:  98.3 F (36.8 C) 97.7 F (36.5 C) 98.7 F (37.1 C)  TempSrc:  Oral Oral Oral  SpO2: 99% 100% 100% 100%  Weight:      Height:        Intake/Output Summary (Last 24 hours) at 01/29/2020 0926 Last data filed at 01/29/2020 0500 Gross per 24 hour  Intake 1518.38 ml  Output 1200 ml  Net 318.38 ml   Filed Weights   01/23/20 0500 01/24/20 0500 01/25/20 0500  Weight: 115 kg 113.9 kg 111.6 kg    Examination:   General: Not in pain or dyspnea, Neurology: Awake and alert, non focal  E ENT: no pallor, no icterus, oral mucosa moist Cardiovascular: No JVD. S1-S2 present, rhythmic, no gallops, rubs, or murmurs. +++ pitting from the hips   lower extremity edema. Pulmonary: vesicular breath sounds bilaterally, adequate air movement, no wheezing, rhonchi or rales. Gastrointestinal. Abdomen distended but not tender, positive tympanic, no rebound, guarding or ascites.  Skin. No rashes Musculoskeletal: no joint deformities     Data Reviewed: I have personally reviewed following labs and imaging studies  CBC: Recent Labs  Lab 01/26/20 0205  WBC 5.1  NEUTROABS 3.2  HGB 11.1*  HCT 34.5*  MCV 78.6*  PLT 568   Basic Metabolic Panel: Recent Labs  Lab 01/24/20 1525 01/24/20 1525 01/25/20 0400 01/26/20 0205 01/27/20 0344 01/28/20 0448 01/29/20 0333  NA 145   < > 145 145 151* 149* 144  K 3.5   < > 3.3* 3.2* 3.2* 3.3* 3.9  CL 117*   < > 115* 117* 114* 114* 112*  CO2 17*   < > 17* 17* 25 25 22   GLUCOSE 111*   < > 118* 142* 213* 147* 296*  BUN 17   < > 16 12 16  28* 38*  CREATININE 1.23   < > 1.13 1.03 1.04  1.22 1.32*  CALCIUM 9.1   < > 8.8* 8.8* 9.2 8.8* 8.6*  MG 2.2  --   --  1.8 2.1 2.0 2.0  PHOS  --   --  2.9 2.6 3.4 3.4 3.8   < > = values in this interval not displayed.   GFR: Estimated Creatinine Clearance: 72.7 mL/min (A) (by C-G formula based on SCr of 1.32 mg/dL (H)). Liver Function Tests: Recent Labs  Lab 01/25/20 0400 01/26/20 0205 01/27/20 0344 01/28/20 0448 01/29/20 0333  AST  --  12* 14*  --   --   ALT  --  13 12  --   --   ALKPHOS  --  76 75  --   --   BILITOT  --  0.7 0.5  --   --   PROT  --  6.2* 6.4*  --   --   ALBUMIN 2.5* 2.4* 2.4* 2.3* 2.5*   No results for input(s): LIPASE, AMYLASE in the last 168 hours. No results for input(s): AMMONIA in the last 168 hours. Coagulation Profile: No results for input(s): INR, PROTIME in the last 168 hours. Cardiac Enzymes: No results for input(s): CKTOTAL, CKMB, CKMBINDEX, TROPONINI in the last 168 hours. BNP (last 3 results) No results for input(s): PROBNP in the last 8760 hours. HbA1C: No results for input(s): HGBA1C in the last 72  hours. CBG: Recent Labs  Lab 01/28/20 1115 01/28/20 1637 01/28/20 2104 01/29/20 0030 01/29/20 0459  GLUCAP 176* 255* 188* 209* 259*   Lipid Profile: No results for input(s): CHOL, HDL, LDLCALC, TRIG, CHOLHDL, LDLDIRECT in the last 72 hours. Thyroid Function Tests: No results for input(s): TSH, T4TOTAL, FREET4, T3FREE, THYROIDAB in the last 72 hours. Anemia Panel: No results for input(s): VITAMINB12, FOLATE, FERRITIN, TIBC, IRON, RETICCTPCT in the last 72 hours.    Radiology Studies: I have reviewed all of the imaging during this hospital visit personally     Scheduled Meds: . amLODipine  10 mg Oral Daily  . Chlorhexidine Gluconate Cloth  6 each Topical Daily  . enoxaparin (LOVENOX) injection  55 mg Subcutaneous Daily  . feeding supplement  1 Container Oral BID BM  . hydrALAZINE  10 mg Intravenous Q4H  . insulin aspart  0-20 Units Subcutaneous Q4H  . sodium chloride flush  10-40 mL Intracatheter Q12H  . sodium chloride flush  3 mL Intravenous Q12H  . sodium chloride flush  3 mL Intravenous Q12H  . spironolactone  50 mg Oral Daily  . sucralfate  1 g Oral TID WC & HS  . tamsulosin  0.8 mg Oral Daily   Continuous Infusions: . sodium chloride 50 mL/hr at 01/29/20 0500  . sodium chloride Stopped (01/20/20 1900)  . TPN ADULT (ION) 40 mL/hr at 01/29/20 0500     LOS: 25 days        Kunio Cummiskey Gerome Apley, MD

## 2020-01-29 NOTE — Progress Notes (Signed)
PHARMACY - TOTAL PARENTERAL NUTRITION CONSULT NOTE  Indication: Prolonged ileus  Patient Measurements: Height: 5\' 7"  (170.2 cm) Weight: 111.6 kg (246 lb 0.5 oz) IBW/kg (Calculated) : 66.1 TPN AdjBW (KG): 76.8 Body mass index is 38.53 kg/m. Usual Weight: 111 kg  Assessment:  23 YOM with PMH significant for HTN and DM, presented on 12/31/19 with aches, HTN urgency and hypokalemia.  He then developed diarrhea with abdominal distension and AKI, with a working diagnosis of nephrotic syndrome. Multiple imagings show gaseous dilatation.  Underwent colonoscopy for decompression on 11/24 and NG tubed was replaced on 11/28 with 1L+ of semi-feculent output.  The latest Abd XR on 11/29 showed colonic ileus pattern.  Patient was on a CMD on 11/11-11/12 and 11/16-11/21, otherwise, he was either NPO or on a CLD throughout the hospitalization.  Nutrition has been inadequate and Pharmacy consulted to manage TPN.  Patient reports no recent weight loss.  Glucose / Insulin: hx poorly controlled DM with A1c 10.6%. CBGs initially low when became NPO not trending up. CBGs 139 - 233 . Utilized 28 units SSI / 24 hrs and started 13 units in TPN at 1800.  Electrolytes: All WNL Renal: nephrotic syndrome Scr 1.32 (BL SCr 1.3-1.5), BUN up to 38- off furosemide due to hypokalemia, on spironolactone 50mg  daily   LFTs / TGs: LFTs wnl,  tbili 0.5 Prealbumin / albumin: albumin 2.5, pre-albumin 12.2 Intake / Output; MIVF: extensively volume overloaded. On half NS @ 50 ml/hr starting 12/2 for hypernatremia. UOP 0.3 ml/kg/hr, NG removed, LBM documented 11/30 GI Imaging:   11/29 Abd XR: colonic ileus pattern w/o significant change 12/1 Abd XR: persistent bowel dilation, likely ileus Surgeries / Procedures: N/A  Central access: PICC placed 01/25/20 TPN start date: 01/25/20  Nutritional Goals (per RD rec 01/25/20): 2250-2450 kCal, 120-140gm protein per day, >2 L fluid  Current Nutrition:  Per MD will advance diet 12/3    Plan:  Per discussion with MD, stop TPN after current bag, will advance diet   Thank you for involving pharmacy in this patient's care.   Benetta Spar, PharmD, BCPS, BCCP Clinical Pharmacist  Please check AMION for all [place] phone numbers After 10:00 PM, call [place] [phone]

## 2020-01-30 LAB — GLUCOSE, CAPILLARY
Glucose-Capillary: 129 mg/dL — ABNORMAL HIGH (ref 70–99)
Glucose-Capillary: 140 mg/dL — ABNORMAL HIGH (ref 70–99)
Glucose-Capillary: 145 mg/dL — ABNORMAL HIGH (ref 70–99)
Glucose-Capillary: 169 mg/dL — ABNORMAL HIGH (ref 70–99)
Glucose-Capillary: 185 mg/dL — ABNORMAL HIGH (ref 70–99)
Glucose-Capillary: 213 mg/dL — ABNORMAL HIGH (ref 70–99)

## 2020-01-30 LAB — BASIC METABOLIC PANEL
Anion gap: 11 (ref 5–15)
BUN: 45 mg/dL — ABNORMAL HIGH (ref 6–20)
CO2: 20 mmol/L — ABNORMAL LOW (ref 22–32)
Calcium: 9 mg/dL (ref 8.9–10.3)
Chloride: 113 mmol/L — ABNORMAL HIGH (ref 98–111)
Creatinine, Ser: 1.37 mg/dL — ABNORMAL HIGH (ref 0.61–1.24)
GFR, Estimated: 60 mL/min — ABNORMAL LOW (ref >60)
Glucose, Bld: 152 mg/dL — ABNORMAL HIGH (ref 70–99)
Potassium: 3.9 mmol/L (ref 3.5–5.1)
Sodium: 144 mmol/L (ref 135–145)

## 2020-01-30 MED ORDER — AMLODIPINE BESYLATE 10 MG PO TABS: 10.0000 mg | ORAL_TABLET | Freq: Every day | ORAL | 0 refills | Status: DC

## 2020-01-30 MED ORDER — TAMSULOSIN HCL 0.4 MG PO CAPS: 0.8000 mg | ORAL_CAPSULE | Freq: Every day | ORAL | 0 refills | Status: AC

## 2020-01-30 MED ORDER — LANTUS SOLOSTAR 100 UNIT/ML ~~LOC~~ SOPN: 20.0000 [IU] | PEN_INJECTOR | Freq: Every day | SUBCUTANEOUS | 0 refills | Status: DC

## 2020-01-30 MED ORDER — SPIRONOLACTONE 50 MG PO TABS: 50.0000 mg | ORAL_TABLET | Freq: Every day | ORAL | 0 refills | Status: DC

## 2020-01-30 NOTE — TOC Progression Note (Signed)
Transition of Care [facility]) - Progression Note    Patient Details  Name: [name] MRN: [id number] Date of Birth: [birth year]  Transition of Care [facility]) CM/SW Contact  Graves-Bigelow, Ocie Cornfield, RN Phone Number: 01/30/2020, 11:53 AM  Clinical Narrative:  Case Manager received a call from staff RN regarding discharge plans for home today. Patient wants to return home with the support of brother. Patient was currently active with [facility] prior to arrival. Received verbal permission to contact the brother [name]. Patient's brother [name] is not able to assist with support in the home for the patient. Patient has motorized scooter in the room and a RW in the home. Outpatient palliative services set up with [place] aware to follow in the facility. Case Manager spoke with patient and understands he will not have support in the home-agreeable to a facility. Clinical Social Worker aware- patient wants to try to leave as soon as possible.    Expected Discharge Plan: [place] Barriers to Discharge: Continued Medical Work up (Pt wanted to return home- brother [name] states unable to support the care in the home.)  Expected Discharge Plan and Services Expected Discharge Plan: [place] Choice: [place] arrangements for the past 2 months: Single Family Home Expected Discharge Date: 01/30/20                   Readmission Risk Interventions Readmission Risk Prevention Plan 01/12/2020  Transportation Screening Complete  Palliative Care Screening Not Applicable  Some recent data might be hidden

## 2020-01-30 NOTE — TOC Progression Note (Signed)
Transition of Care [facility]) - Progression Note    Patient Details  Name: [name] MRN: [id number] Date of Birth: 1960/10/30  Transition of Care [facility]) CM/SW Contact  Elliot Gurney [place], [place] Phone Number: [phone] 01/30/2020, 2:57 PM  Clinical Narrative:    Patient accepted bed offer at [facility]. [employer] authorization started. Reference# [id number].   Mykaila Blunck [address], LCSW Transition of Care [phone]   Expected Discharge Plan: [place] Barriers to Discharge: Continued Medical Work up (Pt wanted to return home- brother [name] states unable to support the care in the home.)  Expected Discharge Plan and Services Expected Discharge Plan: [place] Choice: [place] arrangements for the past 2 months: Single Family Home Expected Discharge Date: 01/30/20                                     Social Determinants of Health (SDOH) Interventions    Readmission Risk Interventions Readmission Risk Prevention Plan 01/12/2020  Transportation Screening Complete  Palliative Care Screening Not Applicable  Some recent data might be hidden

## 2020-01-30 NOTE — Progress Notes (Signed)
[facility] ([facility])  [facility] will reach out to patient when he arrives at facility to begin outpatient palliative care.  Venia Carbon RN, BSN, [facility] Liaison

## 2020-01-30 NOTE — TOC Progression Note (Signed)
Transition of Care [facility]) - Progression Note    Patient Details  Name: [name] MRN: [id number] Date of Birth: [birth year]  Transition of Care [facility]) CM/SW Contact  Elliot Gurney [place], [place] Phone Number:  [phone] 01/30/2020, 10:48 AM  Clinical Narrative:    [facility]obe call to [facility] to discuss patient's discharge there today. Left message with Narda Rutherford, requesting a return call.   [address], LCSW Transition of Care [phone]    Expected Discharge Plan: [place] Barriers to Discharge: Continued Medical Work up  Expected Discharge Plan and Services Expected Discharge Plan: [place] Choice: [place] arrangements for the past 2 months: Single Family Home Expected Discharge Date: 01/30/20                                     Social Determinants of Health (SDOH) Interventions    Readmission Risk Interventions Readmission Risk Prevention Plan 01/12/2020  Transportation Screening Complete  Palliative Care Screening Not Applicable  Some recent data might be hidden

## 2020-01-30 NOTE — Discharge Summary (Addendum)
Physician Discharge Summary  [name] QVZ:[id number] DOB: Apr 10, 1960 DOA: 12/31/2019  PCP: [name], NP  Admit date: 12/31/2019 Discharge date: 01/31/2020  Admitted From: Home  Disposition:  Home   Recommendations for Outpatient Follow-up and new medication changes:  1. Follow up with Juluis Mire NP in 7 days.  2. Patient needs check basic metabolic panel in 3 days.  3. Holding on furosemide and K supplements. 4. Patient placed on spironolactone. 5. Holding metformin and sitagliptin. 6. Decreases insulin glargine to 20 units from 40. 7. Holding on benazepril and started on amlodipine.    Home Health: yes  Equipment/Devices: walker, he has a motorized scooter.   Discharge Condition: stable  CODE STATUS: DNR  Diet recommendation: heart healthy. Soft.   Brief/Interim Summary: [name] was admitted to the hospital with a working diagnosis of nephrotic syndrome. Prolonged hospital stay, complicated by acute kidney injury, severe hypokalemia and ileus  59 year old male with past medical history for hypertension, type 2 diabetes mellitus, history of CVA,substance abuse and chronic leukopenia.Patient reported cramping pain in his lower extremities, he recently moved from [place] and has been not taking his medications. On his initial physical examination his blood pressure was 219/121, heart rate 84, respiratory rate 20, temperature 98, oxygen saturation 100%, his lungs were clear to auscultation bilaterally, heart S1-S2, present rhythmic, soft abdomen, positive bilateral pitting lower extremity edema up to the hips, Sodium 136, potassium 2.5, chloride 105, bicarb 24, glucose 252, BUN 10, creatinine 1.07. White count 3.5, hemoglobin 11.0, hematocrit 34.6, platelets 266. SARS COVID-19 negative. Ck [id number] Urinalysis more than 500 glucose, more than 300 protein, specific gravity 1.025. EKG 90 bpm, normal axis, normal intervals, sinus rhythm, poor R wave progression,  positive LVH, no ST segment T wave changes.  He was diagnosed with nephrotic syndrome, positive anasarca, he was placed on continuous infusion of furosemide with improvement of volume status. Unfortunately had worsening kidney function that prevented further diuresis.  He has developed profound hypokalemia and complicated with ileus. Patient required an NG tube for low intermittent suction.  11/24 patient underwent colonoscopy finding colonic dilatation but no frank obstruction, poor bowel preparation.   Slow recovery from ileus. NG tube was removed and diet was advanced, at the time of discharge patient tolerating soft diet.   1.  Nephrotic syndrome, hypokalemia, hypophosphatemia, acute kidney injury chronic kidney disease stage IIIb.  Patient's kidney function has stabilized with a creatinine of 1.32, 1.37, his edema is stable.  Patient will need follow-up basic metabolic panel within 3 days.  Outpatient follow-up with nephrology.  Patient will be discharged with diuretic therapy with spironolactone 50 mg daily, to resume oral furosemide as an outpatient once potassium 4 or greater.  For now will continue to hold on benazepril.   2.  Ileus.  Patient has persistent abdominal distention but now positive bowel movements, no nausea, no vomiting and tolerating well soft diet.  Gastroenterology recommended tight electrolyte correction, potassium greater than 4.  Out of bed and physical therapy.  3.  Hypertension.  Patient was placed on amlodipine, hydralazine and spironolactone for blood pressure control.  4.  Moderate calorie protein malnutrition.  Patient received nutritional supplementation including TPN while he had severe ileus.  Now his diet has been advanced with good toleration, he is on soft diet.  Continue nutritional supplements.  5.  Metabolic encephalopathy.  Patient received supportive medical therapy with improvement of mentation, discharge is back to his baseline, no  agitation or confusion.  6.  Acute on chronic diastolic heart failure.  Patient received diuresis with furosemide, now on spironolactone.  His lower extremity edema is likely combination of nephrotic syndrome, hypoproteinemia, decreased oncotic pressure.  Echocardiography showed LV systolic function 65 to 46%, moderate LVH, hyperdense myocardium, consider hypertensive cardiomyopathy versus infiltrative cardiomyopathy.  Small pericardial effusion.  Moderate aortic valve stenosis.  7.  Chronic anemia.  His hemoglobin and hematocrit has remained stable.  8.  Urinary retention.  Patient required indwelling Foley catheter, he was placed on tamsulosin.  No his Foley catheter has been removed with good toleration, no further retention.  9.  Type 2 diabetes mellitus, uncontrolled, hemoglobin A1c 10.6.  Patient was placed on insulin sliding scale for glucose coverage and monitoring. Patient probably a good candidate dapagliflozin to consider start as an outpatient.  At discharge resume insulin therapy with glargine reduced dose to 20 units from 40 units, continue to hold on sitgliptin and metformin.   Discharge Diagnoses:  Principal Problem:   Generalized body aches Active Problems:   Hypertension   Substance-related disorder (HCC)   Hypokalemia   Non-traumatic rhabdomyolysis   Grade I diastolic dysfunction   Anemia   Hypokalemia due to loss of potassium   Nephrotic syndrome   Anasarca   Palliative care by specialist   Goals of care, counseling/discussion   Malnutrition of moderate degree    Discharge Instructions   Allergies as of 01/30/2020      Reactions   Other Anaphylaxis, Itching, Swelling   Allergy to GRITS , unknown exactly the specific ingredient he is allergic too      Medication List    STOP taking these medications   atenolol 25 MG tablet Commonly known as: TENORMIN   benazepril 40 MG tablet Commonly known as: LOTENSIN   bismuth subsalicylate 270 JJ/00XF  suspension Commonly known as: PEPTO BISMOL   diltiazem 180 MG 24 hr capsule Commonly known as: Cardizem CD   furosemide 40 MG tablet Commonly known as: Lasix   gabapentin 800 MG tablet Commonly known as: NEURONTIN   ibuprofen 200 MG tablet Commonly known as: ADVIL   Janumet 50-1000 MG tablet Generic drug: sitaGLIPtin-metformin   potassium chloride SA 20 MEQ tablet Commonly known as: KLOR-CON     TAKE these medications   amLODipine 10 MG tablet Commonly known as: NORVASC Take 1 tablet (10 mg total) by mouth daily.   Lantus SoloStar 100 UNIT/ML Solostar Pen Generic drug: insulin glargine Inject 20 Units into the skin at bedtime. What changed: how much to take   spironolactone 50 MG tablet Commonly known as: ALDACTONE Take 1 tablet (50 mg total) by mouth daily.   tamsulosin 0.4 MG Caps capsule Commonly known as: FLOMAX Take 2 capsules (0.8 mg total) by mouth daily.       Contact information for follow-up providers    [facility].   Specialty: [place] Why: Resumption of Home Health services  Contact information: [place] [phone] [phone]            Contact information for after-discharge care    Destination    [facility] .   Service: Skilled Nursing Contact information: [place] [phone]                 Allergies  Allergen Reactions  . Other Anaphylaxis, Itching and Swelling    Allergy to GRITS , unknown exactly the specific ingredient he is  allergic too    Consultations:  Nephrology   Gastroenterology   Surgery    Procedures/Studies: DG Chest 2 View  Result Date: 12/31/2019 CLINICAL DATA:  Shortness of breath.  Generalized body aches. EXAM: CHEST - 2 VIEW COMPARISON:  Radiograph 12/25/2019.  CT 12/12/2019 FINDINGS: Improved lung volumes from prior exam. Chronic cardiomegaly. No  pulmonary edema. No focal airspace disease. Trace fluid in the fissures without significant sub pulmonic effusion. No confluent consolidation. No pneumothorax. No acute osseous abnormalities are seen. IMPRESSION: Chronic cardiomegaly. No acute chest findings. Electronically Signed   By: Keith Rake M.D.   On: 12/31/2019 20:43   DG Abd 1 View  Result Date: 01/27/2020 CLINICAL DATA:  Ileus. EXAM: ABDOMEN - 1 VIEW COMPARISON:  January 26, 2020. FINDINGS: Stable small bowel dilatation is noted concerning for ileus. Nasogastric tube is seen in proximal stomach. No radio-opaque calculi or other significant radiographic abnormality are seen. IMPRESSION: Stable small bowel dilatation is noted concerning for ileus. Electronically Signed   By: Marijo Conception M.D.   On: 01/27/2020 15:30   DG Abd 1 View  Result Date: 01/26/2020 CLINICAL DATA:  Ileus EXAM: ABDOMEN - 1 VIEW COMPARISON:  January 24, 2020 FINDINGS: Nasogastric tube tip and side port are in the stomach. There is persistent bowel dilatation without air-fluid levels. No free air evident. Visualized left lung base clear. IMPRESSION: Persistent bowel dilatation, likely ileus. No free air evident. Nasogastric tube tip and side port in stomach. Electronically Signed   By: Lowella Grip III M.D.   On: 01/26/2020 08:04   DG Abd 1 View  Result Date: 01/23/2020 CLINICAL DATA:  Status post NG tube placement. EXAM: ABDOMEN - 1 VIEW COMPARISON:  January 23, 2020 FINDINGS: There has been an interval placement of enteric catheter with tip overlying the expected location of gastric cardia. Gaseous distension of the abdomen, incompletely visualized. IMPRESSION: 1. Enteric catheter with tip overlying the expected location of gastric cardia. 2. Gaseous distension of the abdomen, incompletely visualized. Electronically Signed   By: Fidela Salisbury M.D.   On: 01/23/2020 15:47   DG Abd 1 View  Result Date: 01/23/2020 CLINICAL DATA:  Ileus EXAM: ABDOMEN -  1 VIEW COMPARISON:  01/22/2020 FINDINGS: Limited portable semi upright view of the abdomen was obtained. The entirety of the abdomen was not included within the field of view secondary to habitus. There is persistent gaseous distension of both large and small bowel loops within the visualized abdomen. Overall, little interval change from the previous study. No gross free intraperitoneal air. IMPRESSION: Persistent gaseous distension of both large and small bowel loops, not significantly changed from the previous study. Electronically Signed   By: Davina Poke D.O.   On: 01/23/2020 11:04   DG Abd 1 View  Result Date: 01/09/2020 CLINICAL DATA:  Abdominal distension and pain EXAM: ABDOMEN - 1 VIEW COMPARISON:  January 05, 2020 CT abdomen and pelvis and abdominal radiograph FINDINGS: There is colonic dilatation throughout much of the colon. No appreciable small bowel dilatation. No air-fluid levels. No free air evident on supine examination. Small pelvic calcifications most likely represent phleboliths. IMPRESSION: Suspect colonic ileus. Distal colonic obstruction potentially could present in this manner. No distal colonic obstruction is seen on recent CT. No appreciable small bowel dilatation. No air-fluid levels. No evident free air on supine examination Electronically Signed   By: Lowella Grip III M.D.   On: 01/09/2020 13:36   CT ABDOMEN PELVIS W CONTRAST  Result Date: 01/16/2020 CLINICAL DATA:  Suspect bowel obstruction. Abdominal pain and distension. EXAM: CT ABDOMEN AND PELVIS WITH CONTRAST TECHNIQUE: Multidetector CT imaging of the abdomen and pelvis was performed using the standard protocol following bolus administration of intravenous contrast. Unable to move patient completely into scanner to cover the very superior aspect of the liver due to patient's severe claustrophobia. CONTRAST:  144mL OMNIPAQUE IOHEXOL 300 MG/ML  SOLN COMPARISON:  01/05/2020 FINDINGS: Lower chest: Lung bases are  clear.  Mild cardiomegaly. Hepatobiliary: Liver and biliary tree are normal. There is a 2.4 cm stone over the gallbladder. Pancreas: Normal. Spleen: Normal. Adrenals/Urinary Tract: Adrenal glands are normal. Kidneys are normal in size without hydronephrosis or focal mass. No evidence of nephrolithiasis. Ureters are normal. Foley catheter is present within a decompressed bladder. Stomach/Bowel: Nasogastric tube is present with tip over the posterior gastric fundus. Air is present throughout the colon with fluid over the rectosigmoid colon. There are numerous air and fluid-filled dilated small bowel loops measuring up to 4.5 cm in diameter. No evidence of pneumatosis and no evidence of free peritoneal air. Findings suggest a distal early/partial small bowel obstruction and less likely ileus. No definite transition point visualized. Appendix not visualized. Vascular/Lymphatic: Abdominal aorta is normal caliber. No significant adenopathy. Reproductive: Normal. Other: Tiny amount of free peritoneal fluid. Mild to moderate subcutaneous edema over the abdominal wall. Musculoskeletal: Degenerative change of the spine. IMPRESSION: 1. Numerous air and fluid-filled dilated small bowel loops measuring up to 4.5 cm in diameter with air throughout the colon. No definite transition point visualized. No evidence of pneumatosis or free peritoneal air. Findings suggest a distal early/partial small bowel obstruction and less likely ileus. 2. 2.4 cm gallstone unchanged. 3. Tubes and lines as described. 4. Moderate subcutaneous edema over the abdomen/pelvis. Electronically Signed   By: Marin Olp M.D.   On: 01/16/2020 16:46   CT ABDOMEN PELVIS W CONTRAST  Result Date: 01/06/2020 CLINICAL DATA:  59 year male with abdominal distension. Concern for bowel obstruction. EXAM: CT ABDOMEN AND PELVIS WITH CONTRAST TECHNIQUE: Multidetector CT imaging of the abdomen and pelvis was performed using the standard protocol following  bolus administration of intravenous contrast. CONTRAST:  139mL OMNIPAQUE IOHEXOL 300 MG/ML  SOLN COMPARISON:  CT of the chest abdomen pelvis dated 09/28/2017. FINDINGS: Evaluation of this exam is limited due to respiratory motion artifact. Lower chest: Partially visualized small pericardial effusion. There is mild dilatation of the visualized pulmonary vasculature, likely mild congestion. No intra-abdominal free air or free fluid. Hepatobiliary: Probable mild fatty liver. No intrahepatic biliary duct dilatation. There is a 15 mm stone in the neck of the gallbladder. No pericholecystic fluid or evidence of acute cholecystitis by CT. Ultrasound may provide better evaluation if there is high clinical concern for acute cholecystitis. Pancreas: There is moderate atrophy of the pancreas. No active inflammatory changes. No dilatation of the main pancreatic duct. Spleen: Normal in size without focal abnormality. Adrenals/Urinary Tract: The adrenal glands unremarkable. There is no hydronephrosis on either side. There is symmetric enhancement and excretion of contrast by both kidneys. The visualized ureters and urinary bladder appear unremarkable. Stomach/Bowel: There is air distended colon without evidence of obstruction or twisting. There is segmental thickening of the distal transverse colon (14/3) which may be related to underdistention. Mild focal colitis is less likely. Clinical correlation is recommended. There is apparent soft tissue thickening with possible shouldering within the lumen of this segment of the colon concerning for a mass. Further evaluation with colonoscopy after resolution of acute symptoms recommended. There is  no bowel obstruction. The appendix is normal. Vascular/Lymphatic: The abdominal aorta and IVC are unremarkable. No portal venous gas. There is no adenopathy. Reproductive: The prostate and seminal vesicles are grossly unremarkable as visualized. Other: There is diffuse subcutaneous edema and  anasarca. Musculoskeletal: Degenerative changes of spine. No acute osseous pathology. IMPRESSION: 1. Air distended colon without evidence of obstruction or twisting. Normal appendix. 2. Apparent soft tissue thickening of the distal transverse colon concerning for a mass. Further evaluation with colonoscopy after resolution of acute symptoms recommended. 3. Cholelithiasis. 4. Small pericardial effusion and findings of mild pulmonary congestion. 5. Diffuse subcutaneous edema and anasarca. Electronically Signed   By: Anner Crete M.D.   On: 01/06/2020 00:04   US RENAL  Result Date: 01/09/2020 CLINICAL DATA:  Recent acute kidney injury with abdominal pain EXAM: RENAL / URINARY TRACT ULTRASOUND COMPLETE COMPARISON:  Renal ultrasound January 04, 2020; CT abdomen and pelvis January 05, 2020 FINDINGS: Right Kidney: Renal measurements: 10.6 x 6.3 x 5.2 cm = volume: 183 mL. Echogenicity and renal cortical thickness are within normal limits. No mass, perinephric fluid, or hydronephrosis visualized. No sonographically demonstrable calculus or ureterectasis. Left Kidney: Renal measurements: 12.4 x 6.0 x 5.3 cm = volume: 207 mL. Echogenicity and renal cortical thickness are within normal limits. No mass, perinephric fluid, or hydronephrosis visualized. No sonographically demonstrable calculus or ureterectasis. Bladder: Urinary bladder again appears somewhat distended with a measured urinary bladder volume of 497 mL. Urinary bladder wall thickness appears normal. Other: None. IMPRESSION: Persistent relative distension of the urinary bladder, raising concern for a degree of bladder outlet obstruction. Study otherwise unremarkable. Electronically Signed   By: Lowella Grip III M.D.   On: 01/09/2020 13:34   US RENAL  Result Date: 01/04/2020 CLINICAL DATA:  Acute kidney injury EXAM: RENAL / URINARY TRACT ULTRASOUND COMPLETE COMPARISON:  None. FINDINGS: Right Kidney: Renal measurements: 10.5 x 5.5 x 5.7 cm = volume:  173.0 mL. Echogenicity and renal cortical thickness are within normal limits. No mass, perinephric fluid, or hydronephrosis visualized. No sonographically demonstrable calculus or ureterectasis. Left Kidney: Renal measurements: 12.6 x 5.5 x 5.2 cm = volume: 188.4 mL. Echogenicity and renal cortical thickness are within normal limits. No mass, perinephric fluid, or hydronephrosis visualized. No sonographically demonstrable calculus or ureterectasis. Bladder: Urinary bladder appears somewhat distended. Appears normal for degree of bladder distention. Other: None. IMPRESSION: Urinary bladder appears somewhat distended. No lesion reparable to the urinary bladder is seen on this study. Kidneys appear within normal limits bilaterally. Electronically Signed   By: Lowella Grip III M.D.   On: 01/04/2020 17:50   US Abdomen Limited  Result Date: 01/22/2020 CLINICAL DATA:  Ascites EXAM: LIMITED ABDOMEN ULTRASOUND FOR ASCITES TECHNIQUE: Limited ultrasound survey for ascites was performed in all four abdominal quadrants. COMPARISON:  CT dated January 16, 2020 FINDINGS: There is no significant ascites in the abdomen. There are dilated loops of small bowel and colon throughout the abdomen, several which appear to contain significant amounts of fluid. IMPRESSION: 1. No significant ascites. 2. Dilated loops of small bowel and colon are again noted. Electronically Signed   By: Constance Holster M.D.   On: 01/22/2020 18:55   DG Abd 2 Views  Result Date: 01/21/2020 CLINICAL DATA:  59 year old male with abdominal pain. Bowel obstruction versus ileus. EXAM: ABDOMEN - 2 VIEW COMPARISON:  01/19/2020 radiographs and earlier, CT Abdomen and Pelvis 01/16/2020. FINDINGS: Upright and supine views. Negative lung bases. No pneumoperitoneum identified. Gas-filled small bowel loops throughout the abdomen and  pelvis over this series of exams this month, with regressed dilatation of the sigmoid colon since 01/09/2020. Today the bowel  gas pattern has not significantly changed since 01/19/2020, with increased small bowel gas since 01/09/2020. No acute osseous abnormality identified. IMPRESSION: 1. Bowel-gas pattern has been abnormal this month, with largely resolved dilatation of the sigmoid colon since 01/05/2020, but otherwise no improvement. Versus a relatively normal gas pattern evident on chest CTA in October this year. 2. The constellation favors persistent severe ileus over a mechanical obstruction. No pneumoperitoneum is identified. 3. Lung bases appear negative. Electronically Signed   By: Genevie Ann M.D.   On: 01/21/2020 07:24   DG Abd 2 Views  Result Date: 01/19/2020 CLINICAL DATA:  Abdominal distension and pain EXAM: ABDOMEN - 2 VIEW COMPARISON:  January 19, 2020 study obtained earlier in the day FINDINGS: Supine and upright images were obtained. There remain multiple loops of dilated small bowel without appreciable air-fluid levels. No appreciable free air. Visualized lung bases clear. No abnormal calcifications. IMPRESSION: Persistent dilatation of multiple small bowel loops. No free air. Appearance consistent with ileus, although a degree of small bowel obstruction cannot be excluded. Electronically Signed   By: Lowella Grip III M.D.   On: 01/19/2020 13:26   DG Abd 2 Views  Result Date: 01/19/2020 CLINICAL DATA:  Abdominal distension EXAM: ABDOMEN - 2 VIEW COMPARISON:  01/18/2020 FINDINGS: Extensive gaseous distention of large and small bowel loops throughout the abdomen, similar in appearance to prior. No gross free intraperitoneal air. IMPRESSION: Similar extensive gaseous distention of large and small bowel loops throughout the abdomen. Electronically Signed   By: Davina Poke D.O.   On: 01/19/2020 08:14   DG Abd 2 Views  Result Date: 01/18/2020 CLINICAL DATA:  Abdominal pain, distention EXAM: ABDOMEN - 2 VIEW COMPARISON:  None. FINDINGS: Diffuse gaseous distention of bowel compatible with ileus. No real  change since prior study. No organomegaly or free air. IMPRESSION: Diffuse gaseous distention of bowel compatible with ileus. No change. Electronically Signed   By: Rolm Baptise M.D.   On: 01/18/2020 11:23   DG Abd 2 Views  Result Date: 01/16/2020 CLINICAL DATA:  Worsening abdominal pain and distention. EXAM: ABDOMEN - 2 VIEW COMPARISON:  Multiple study since January 05, 2020 FINDINGS: Diffuse colonic dilatation is identified, similar in the interval. There are now dilated loops of small bowel, particularly in the left side of the abdomen. The small bowel dilatation is apparently new. No free air, portal venous gas, or pneumatosis identified. IMPRESSION: 1. Diffuse colonic dilatation remains. There is now small bowel dilatation as well, which is a new finding, particular in the left side of the abdomen. The findings could represent ileus or distal colonic obstruction. Electronically Signed   By: Dorise Bullion III M.D   On: 01/16/2020 13:56   DG Abd Portable 1V  Result Date: 01/24/2020 CLINICAL DATA:  Follow-up ileus. EXAM: PORTABLE ABDOMEN - 1 VIEW COMPARISON:  01/23/2020 FINDINGS: Nasogastric tube remains within the stomach. Diffuse gaseous distention of colon shows no significant change, most consistent with colonic ileus. IMPRESSION: Colonic ileus pattern, without significant change. Electronically Signed   By: Marlaine Hind M.D.   On: 01/24/2020 11:39   DG Abd Portable 1V  Result Date: 01/22/2020 CLINICAL DATA:  Evaluate bowel gas pattern/ileus. EXAM: PORTABLE ABDOMEN - 1 VIEW COMPARISON:  Prior radiograph from 01/21/2020 FINDINGS: Again seen are multiple prominent gas-filled loops of bowel throughout the abdomen and pelvis, including both gaseous distension of small and  large bowel. Gas lucency overlying the left upper quadrant felt to reflect gas within the gastric fundus. In comparison with prior radiograph from 1 day earlier, overall appearance is perhaps mildly improved, suggesting  improving ileus. No visible pneumoperitoneum or other complication. Visualized lung bases are clear. Visualized osseous structures are unchanged. IMPRESSION: Persistent diffuse gaseous distention of small and large bowel, most suggestive of ileus. Overall, appearance is perhaps mildly improved in comparison with prior radiograph from 1 day earlier, suggesting mild improvement. Electronically Signed   By: Jeannine Boga M.D.   On: 01/22/2020 07:24   DG Abd Portable 1V  Result Date: 01/13/2020 CLINICAL DATA:  Abdominal distention. EXAM: PORTABLE ABDOMEN - 1 VIEW COMPARISON:  Limb 1421 FINDINGS: Diffuse gaseous bowel distention noted, mainly colon, similar to prior. No unexpected abdominopelvic calcification. IMPRESSION: Persistent diffuse gaseous colonic distention, similar to prior. Electronically Signed   By: Misty Stanley M.D.   On: 01/13/2020 10:58   DG Abd Portable 1V  Result Date: 01/05/2020 CLINICAL DATA:  59 year old male with abdominal distension. EXAM: PORTABLE ABDOMEN - 1 VIEW COMPARISON:  CT Abdomen and Pelvis 09/28/2017. FINDINGS: Portable AP supine view at 1529 hours. Abnormally gas distended sigmoid colon projecting in the central lower abdomen and upper pelvis is dilated to 15 mm, with upstream gas dilatation of the large bowel. The configuration is suspicious for developing sigmoid volvulus. Superimposed retained stool in the large bowel. No dilated small bowel loops. No pneumoperitoneum is evident on this supine view. No acute osseous abnormality identified. IMPRESSION: Dilated sigmoid colon with upstream gas-filled large bowel suspicious for Sigmoid Volvulus. CT Abdomen and Pelvis (oral and IV contrast preferred) should be confirmatory. These results will be called to the ordering clinician or representative by the Radiologist Assistant, and communication documented in the PACS or [employer]. Electronically Signed   By: Genevie Ann M.D.   On: 01/05/2020 17:49   ECHOCARDIOGRAM  COMPLETE  Result Date: 01/01/2020    ECHOCARDIOGRAM REPORT   Patient Name:   DILLINGER ASTON Date of Exam: 01/01/2020 Medical Rec #:  [id number]     Height:       68.0 in Accession #:    [id number]    Weight:       281.5 lb Date of Birth:  Jan 20, 1961      BSA:          2.363 m Patient Age:    59 years      BP:           151/94 mmHg Patient Gender: M             HR:           71 bpm. Exam Location:  Inpatient Procedure: 2D Echo Indications:    hypertensive urgency  History:        Patient has prior history of Echocardiogram examinations, most                 recent 09/29/2017. Stroke; Risk Factors:Hypertension and Diabetes.  Sonographer:    Johny Chess Referring Phys: [place]  1. Left ventricular ejection fraction, by estimation, is 65 to 70%. The left ventricle has normal function. The left ventricle has no regional wall motion abnormalities. There is moderate left ventricular hypertrophy. Hyperdense myocardium, consider hypertensive cardiomyopathy versus infilrative cardiomyopathy. Left ventricular diastolic parameters are consistent with Grade I diastolic dysfunction (impaired relaxation).  2. Right ventricular systolic function is normal. The right ventricular size is normal. There is  mildly elevated pulmonary artery systolic pressure. The estimated right ventricular systolic pressure is 02.6 mmHg.  3. Left atrial size was mildly dilated.  4. A small pericardial effusion is present. The pericardial effusion is circumferential.  5. The mitral valve is grossly normal. Mild mitral valve regurgitation.  6. The aortic valve is functionally bicuspid. There is moderate calcification of the aortic valve. Aortic valve regurgitation is trivial. Moderate aortic valve stenosis. Aortic valve mean gradient measures 13.0 mmHg. Aortic valve Vmax measures 2.37 m/s.  Dimentionless index 0.32.  7. The inferior vena cava is dilated in size with <50% respiratory variability, suggesting right atrial pressure  of 15 mmHg. FINDINGS  Left Ventricle: Left ventricular ejection fraction, by estimation, is 65 to 70%. The left ventricle has normal function. The left ventricle has no regional wall motion abnormalities. The left ventricular internal cavity size was normal in size. There is  moderate left ventricular hypertrophy. Left ventricular diastolic parameters are consistent with Grade I diastolic dysfunction (impaired relaxation). Right Ventricle: The right ventricular size is normal. No increase in right ventricular wall thickness. Right ventricular systolic function is normal. There is mildly elevated pulmonary artery systolic pressure. The tricuspid regurgitant velocity is 2.66  m/s, and with an assumed right atrial pressure of 15 mmHg, the estimated right ventricular systolic pressure is 37.8 mmHg. Left Atrium: Left atrial size was mildly dilated. Right Atrium: Right atrial size was normal in size. Pericardium: A small pericardial effusion is present. The pericardial effusion is circumferential. Mitral Valve: The mitral valve is grossly normal. Mild mitral valve regurgitation. Tricuspid Valve: The tricuspid valve is grossly normal. Tricuspid valve regurgitation is mild. Aortic Valve: The aortic valve is bicuspid. There is moderate calcification of the aortic valve. There is mild to moderate aortic valve annular calcification. Aortic valve regurgitation is trivial. Moderate aortic stenosis is present. Aortic valve mean gradient measures 13.0 mmHg. Aortic valve peak gradient measures 22.5 mmHg. Aortic valve area, by VTI measures 1.21 cm. Pulmonic Valve: The pulmonic valve was grossly normal. Pulmonic valve regurgitation is trivial. Aorta: The aortic root is normal in size and structure. Venous: The inferior vena cava is dilated in size with less than 50% respiratory variability, suggesting right atrial pressure of 15 mmHg. IAS/Shunts: No atrial level shunt detected by color flow Doppler.  LEFT VENTRICLE PLAX 2D LVIDd:          4.80 cm  Diastology LVIDs:         3.20 cm  LV e' medial:    5.44 cm/s LV PW:         1.30 cm  LV E/e' medial:  12.9 LV IVS:        1.30 cm  LV e' lateral:   6.31 cm/s LVOT diam:     2.20 cm  LV E/e' lateral: 11.1 LV SV:         58 LV SV Index:   25 LVOT Area:     3.80 cm  RIGHT VENTRICLE             IVC RV S prime:     14.90 cm/s  IVC diam: 2.70 cm TAPSE (M-mode): 2.7 cm LEFT ATRIUM             Index       RIGHT ATRIUM           Index LA diam:        4.40 cm 1.86 cm/m  RA Area:     16.10 cm LA Vol (A2C):  57.0 ml 24.12 ml/m RA Volume:   41.60 ml  17.60 ml/m LA Vol (A4C):   87.9 ml 37.19 ml/m LA Biplane Vol: 73.9 ml 31.27 ml/m  AORTIC VALVE AV Area (Vmax):    1.30 cm AV Area (Vmean):   1.19 cm AV Area (VTI):     1.21 cm AV Vmax:           237.00 cm/s AV Vmean:          165.000 cm/s AV VTI:            0.482 m AV Peak Grad:      22.5 mmHg AV Mean Grad:      13.0 mmHg LVOT Vmax:         80.90 cm/s LVOT Vmean:        51.800 cm/s LVOT VTI:          0.153 m LVOT/AV VTI ratio: 0.32  AORTA Ao Root diam: 3.10 cm Ao Asc diam:  3.70 cm MITRAL VALVE               TRICUSPID VALVE MV Area (PHT): 3.77 cm    TR Peak grad:   28.3 mmHg MV Decel Time: 201 msec    TR Vmax:        266.00 cm/s MV E velocity: 70.30 cm/s MV A velocity: 85.30 cm/s  SHUNTS MV E/A ratio:  0.82        Systemic VTI:  0.15 m                            Systemic Diam: 2.20 cm Rozann Lesches MD Electronically signed by Rozann Lesches MD Signature Date/Time: 01/01/2020/1:40:54 PM    Final    VAS [place] LOWER EXTREMITY VENOUS (DVT)  Result Date: 01/04/2020  Lower Venous DVT Study Indications: Edema.  Risk Factors: None identified. Limitations: Body habitus and poor ultrasound/tissue interface. Comparison Study: No prior studies. Performing Technologist: Oliver Hum RVT  Examination Guidelines: A complete evaluation includes B-mode imaging, spectral Doppler, color Doppler, and power Doppler as needed of all accessible portions of each vessel.  Bilateral testing is considered an integral part of a complete examination. Limited examinations for reoccurring indications may be performed as noted. The reflux portion of the exam is performed with the patient in reverse Trendelenburg.  +---------+---------------+---------+-----------+----------+--------------+ RIGHT    CompressibilityPhasicitySpontaneityPropertiesThrombus Aging +---------+---------------+---------+-----------+----------+--------------+ CFV      Full           Yes      Yes                                 +---------+---------------+---------+-----------+----------+--------------+ SFJ      Full                                                        +---------+---------------+---------+-----------+----------+--------------+ FV Prox  Full                                                        +---------+---------------+---------+-----------+----------+--------------+ FV Mid   Full                                                        +---------+---------------+---------+-----------+----------+--------------+  FV DistalFull                                                        +---------+---------------+---------+-----------+----------+--------------+ PFV      Full                                                        +---------+---------------+---------+-----------+----------+--------------+ POP      Full           Yes      Yes                                 +---------+---------------+---------+-----------+----------+--------------+ PTV      Full                                                        +---------+---------------+---------+-----------+----------+--------------+ PERO     Full                                                        +---------+---------------+---------+-----------+----------+--------------+   +---------+---------------+---------+-----------+----------+--------------+ LEFT      CompressibilityPhasicitySpontaneityPropertiesThrombus Aging +---------+---------------+---------+-----------+----------+--------------+ CFV      Full           Yes      Yes                                 +---------+---------------+---------+-----------+----------+--------------+ SFJ      Full                                                        +---------+---------------+---------+-----------+----------+--------------+ FV Prox  Full                                                        +---------+---------------+---------+-----------+----------+--------------+ FV Mid   Full                                                        +---------+---------------+---------+-----------+----------+--------------+ FV DistalFull                                                        +---------+---------------+---------+-----------+----------+--------------+  PFV      Full                                                        +---------+---------------+---------+-----------+----------+--------------+ POP      Full           Yes      Yes                                 +---------+---------------+---------+-----------+----------+--------------+ PTV      Full                                                        +---------+---------------+---------+-----------+----------+--------------+ PERO     Full                                                        +---------+---------------+---------+-----------+----------+--------------+     Summary: RIGHT: - There is no evidence of deep vein thrombosis in the lower extremity. However, portions of this examination were limited- see technologist comments above.  - No cystic structure found in the popliteal fossa.  LEFT: - There is no evidence of deep vein thrombosis in the lower extremity. However, portions of this examination were limited- see technologist comments above.  - No cystic structure found in the popliteal  fossa.  *See table(s) above for measurements and observations. Electronically signed by Curt Jews MD on 01/04/2020 at 2:52:48 PM.    Final    [place] EKG SITE RITE  Result Date: 01/25/2020 If Site Rite image not attached, placement could not be confirmed due to current cardiac rhythm.  [place] EKG SITE RITE  Result Date: 01/24/2020 If Site Rite image not attached, placement could not be confirmed due to current cardiac rhythm.     Procedures: colonoscopy   Subjective: Patient is feeling better, no nausea or vomiting, no chest pain or dyspnea, tolerating po well.   Discharge Exam: Vitals:   01/30/20 0300 01/30/20 0814  BP: 131/81 (!) 157/95  Pulse: 91 (!) 103  Resp: 18 17  Temp: 98 F (36.7 C) 98.1 F (36.7 C)  SpO2: 95% 97%   Vitals:   01/29/20 1231 01/29/20 1935 01/30/20 0300 01/30/20 0814  BP: 127/90 (!) 141/97 131/81 (!) 157/95  Pulse: 96 98 91 (!) 103  Resp: 18 16 18 17   Temp: 98 F (36.7 C) 98.2 F (36.8 C) 98 F (36.7 C) 98.1 F (36.7 C)  TempSrc:  Oral Oral Oral  SpO2: 99% 100% 95% 97%  Weight:      Height:        General: Not in pain or dyspnea.  Neurology: Awake and alert, non focal  E ENT: no pallor, no icterus, oral mucosa moist Cardiovascular: No JVD. S1-S2 present, rhythmic, no gallops, rubs, or murmurs. ++/+++ pitting lower extremity edema. Pulmonary: positive breath sounds bilaterally, adequate air movement, no wheezing, rhonchi or rales. Gastrointestinal. Abdomen distended, non tender, tympanic Skin. No  rashes Musculoskeletal: no joint deformities   The results of significant diagnostics from this hospitalization (including imaging, microbiology, ancillary and laboratory) are listed below for reference.     Microbiology: No results found for this or any previous visit (from the past 240 hour(s)).   Labs: BNP (last 3 results) Recent Labs    12/16/19 1555 12/25/19 1638 12/31/19 2122  BNP 63.6 63.9 629.4*   Basic Metabolic Panel: Recent Labs   Lab 01/24/20 1525 01/24/20 1525 01/25/20 0400 01/25/20 0400 01/26/20 0205 01/27/20 0344 01/28/20 0448 01/29/20 0333 01/30/20 0353  NA 145   < > 145   < > 145 151* 149* 144 144  K 3.5   < > 3.3*   < > 3.2* 3.2* 3.3* 3.9 3.9  CL 117*   < > 115*   < > 117* 114* 114* 112* 113*  CO2 17*   < > 17*   < > 17* 25 25 22  20*  GLUCOSE 111*   < > 118*   < > 142* 213* 147* 296* 152*  BUN 17   < > 16   < > 12 16 28* 38* 45*  CREATININE 1.23   < > 1.13   < > 1.03 1.04 1.22 1.32* 1.37*  CALCIUM 9.1   < > 8.8*   < > 8.8* 9.2 8.8* 8.6* 9.0  MG 2.2  --   --   --  1.8 2.1 2.0 2.0  --   PHOS  --   --  2.9  --  2.6 3.4 3.4 3.8  --    < > = values in this interval not displayed.   Liver Function Tests: Recent Labs  Lab 01/25/20 0400 01/26/20 0205 01/27/20 0344 01/28/20 0448 01/29/20 0333  AST  --  12* 14*  --   --   ALT  --  13 12  --   --   ALKPHOS  --  76 75  --   --   BILITOT  --  0.7 0.5  --   --   PROT  --  6.2* 6.4*  --   --   ALBUMIN 2.5* 2.4* 2.4* 2.3* 2.5*   No results for input(s): LIPASE, AMYLASE in the last 168 hours. No results for input(s): AMMONIA in the last 168 hours. CBC: Recent Labs  Lab 01/26/20 0205  WBC 5.1  NEUTROABS 3.2  HGB 11.1*  HCT 34.5*  MCV 78.6*  PLT 286   Cardiac Enzymes: No results for input(s): CKTOTAL, CKMB, CKMBINDEX, TROPONINI in the last 168 hours. BNP: Invalid input(s): POCBNP CBG: Recent Labs  Lab 01/29/20 1720 01/29/20 1934 01/29/20 2328 01/30/20 0349 01/30/20 0819  GLUCAP 231* 181* 103* 140* 169*   D-Dimer No results for input(s): DDIMER in the last 72 hours. Hgb A1c No results for input(s): HGBA1C in the last 72 hours. Lipid Profile No results for input(s): CHOL, HDL, LDLCALC, TRIG, CHOLHDL, LDLDIRECT in the last 72 hours. Thyroid function studies No results for input(s): TSH, T4TOTAL, T3FREE, THYROIDAB in the last 72 hours.  Invalid input(s): FREET3 Anemia work up No results for input(s): VITAMINB12, FOLATE, FERRITIN,  TIBC, IRON, RETICCTPCT in the last 72 hours. Urinalysis    Component Value Date/Time   COLORURINE YELLOW 01/24/2020 1925   APPEARANCEUR CLOUDY (A) 01/24/2020 1925   APPEARANCEUR Clear 12/31/2013 2116   LABSPEC 1.019 01/24/2020 1925   LABSPEC 1.032 12/31/2013 2116   PHURINE 5.0 01/24/2020 1925   GLUCOSEU 150 (A) 01/24/2020 1925   GLUCOSEU >=500 12/31/2013  2116   HGBUR SMALL (A) 01/24/2020 1925   BILIRUBINUR NEGATIVE 01/24/2020 1925   BILIRUBINUR Negative 12/31/2013 2116   KETONESUR 5 (A) 01/24/2020 1925   PROTEINUR >=300 (A) 01/24/2020 1925   UROBILINOGEN 1.0 08/28/2013 1709   NITRITE NEGATIVE 01/24/2020 1925   LEUKOCYTESUR NEGATIVE 01/24/2020 1925   LEUKOCYTESUR Negative 12/31/2013 2116   Sepsis Labs Invalid input(s): PROCALCITONIN,  WBC,  LACTICIDVEN Microbiology No results found for this or any previous visit (from the past 240 hour(s)).   Time coordinating discharge: 45 minutes  SIGNED:   Tawni Millers, MD  [facility] 01/30/2020, 9:05 AM

## 2020-01-30 NOTE — TOC Progression Note (Addendum)
Transition of Care [facility]) - Progression Note    Patient Details  Name: BLASE BECKNER MRN: [id number] Date of Birth: 11-22-1960  Transition of Care [facility]) CM/SW Contact  Elliot Gurney [place], [place] Phone Number: [phone] 01/30/2020, 1:02 PM  Clinical Narrative:     Patient accepted to [facility]. Claiborne Billings contacted and patient can discharge there tomorrow. Patient to go in to room 117. Patient will need a new COVID test and updated authorization.   COVID test requested, Navi contacted to update authorization to new facility.   Lanna Labella [address], LCSW Transition of Care [phone]   Expected Discharge Plan: [place] Barriers to Discharge: Continued Medical Work up (Pt wanted to return home- brother [name] states unable to support the care in the home.)  Expected Discharge Plan and Services Expected Discharge Plan: [place] Choice: [place] arrangements for the past 2 months: Single Family Home Expected Discharge Date: 01/30/20                                     Social Determinants of Health (SDOH) Interventions    Readmission Risk Interventions Readmission Risk Prevention Plan 01/12/2020  Transportation Screening Complete  Palliative Care Screening Not Applicable  Some recent data might be hidden

## 2020-01-31 LAB — BASIC METABOLIC PANEL
Anion gap: 11 (ref 5–15)
BUN: 39 mg/dL — ABNORMAL HIGH (ref 6–20)
CO2: 17 mmol/L — ABNORMAL LOW (ref 22–32)
Calcium: 8.9 mg/dL (ref 8.9–10.3)
Chloride: 113 mmol/L — ABNORMAL HIGH (ref 98–111)
Creatinine, Ser: 1.34 mg/dL — ABNORMAL HIGH (ref 0.61–1.24)
GFR, Estimated: >60 mL/min (ref >60)
Glucose, Bld: 187 mg/dL — ABNORMAL HIGH (ref 70–99)
Potassium: 3.8 mmol/L (ref 3.5–5.1)
Sodium: 141 mmol/L (ref 135–145)

## 2020-01-31 LAB — GLUCOSE, CAPILLARY
Glucose-Capillary: 142 mg/dL — ABNORMAL HIGH (ref 70–99)
Glucose-Capillary: 148 mg/dL — ABNORMAL HIGH (ref 70–99)
Glucose-Capillary: 178 mg/dL — ABNORMAL HIGH (ref 70–99)

## 2020-01-31 LAB — SARS CORONAVIRUS 2 BY RT PCR (HOSPITAL ORDER, PERFORMED IN ~~LOC~~ HOSPITAL LAB): SARS Coronavirus 2: NEGATIVE

## 2020-01-31 NOTE — Care Management Important Message (Signed)
Important Message  Patient Details  Name: [name] MRN: [id number] Date of Birth: 29-Nov-1960   Medicare Important Message Given:  Yes     Latunya Kissick P Jeanene Mena 01/31/2020, 2:49 PM

## 2020-01-31 NOTE — Progress Notes (Signed)
Patient has remained hemodynamically stable, continue to tolerate soft diet and positive bowel movements. No nausea or vomiting.   His discharge vital signs temperature 97.6, blood pressure 148/93, heart rate 92, respiratory rate 17, oxygen saturation 100% on room air.  His lungs are clear to auscultation, his abdomen is distended but soft and nontender, positive extremity edema.  Sodium 141, potassium 3.8, chloride 113, bicarb 17, glucose 187, BUN 39, creatinine 1.34.  Patient medically stable for discharge.

## 2020-01-31 NOTE — Progress Notes (Signed)
Patient's wheel chair not transported, family notified to pick it up. Report will be called

## 2020-01-31 NOTE — Plan of Care (Signed)

## 2020-01-31 NOTE — TOC Transition Note (Addendum)
Transition of Care [facility]) - CM/SW Discharge Note   Patient Details  Name: [name] MRN: [id number] Date of Birth: [birth year]  Transition of Care [facility]) CM/SW Contact:  Sharin Mons, RN Phone Number: 01/31/2020, 12:14 PM   Clinical Narrative:    Patient will DC to: [place] date: 01/31/2020 Family notified: pt states will notify brother Transport by: Corey Harold  Authorization received for SNF : Navi ID [phone], [place].: [id number]. Arista Dagout CM. Approved for 3 days Dec. 6-8. Fax # [phone].  Per MD patient ready for DC today . RN, patient, patient's family, and facility notified of DC. Discharge Summary and FL2 sent to facility. RN to call report prior to discharge [phone]). Rm# [phone]. DC packet on chart. Ambulance transport requested for patient.  Pt with motorized W/C. W/C can't be transportated by [facility]. NCM has arranged for [facility] Transportation to pick up W/C and  deliver W/C to SNF, referral made with Myna [phone]). W/C will be @ front desk for pick up. Pt made aware  RNCM will sign off for now as intervention is no longer needed. Please consult [place] again if new needs arise.    Final next level of care: [place] [facility]) Barriers to Discharge: No Barriers Identified   Patient Goals and CMS Choice Patient states their goals for this hospitalization and ongoing recovery are:: patient wants to return home; however, agreeable to SNF CMS Medicare.gov Compare Post Acute Care list provided to:: Patient Choice offered to / list presented to : Patient  Discharge Placement                       Discharge Plan and Services     Post Acute Care Choice: [place]                               Social Determinants of Health (SDOH) Interventions     Readmission Risk Interventions Readmission Risk Prevention Plan 01/12/2020  Transportation Screening Complete   Palliative Care Screening Not Applicable  Some recent data might be hidden

## 2020-02-25 ENCOUNTER — Non-Acute Institutional Stay: Payer: Medicare HMO | Admitting: Hospice

## 2020-02-25 ENCOUNTER — Other Ambulatory Visit: Payer: Self-pay

## 2020-02-25 DIAGNOSIS — N049 Nephrotic syndrome with unspecified morphologic changes: Secondary | ICD-10-CM

## 2020-02-25 DIAGNOSIS — Z515 Encounter for palliative care: Secondary | ICD-10-CM

## 2020-02-25 NOTE — Progress Notes (Signed)
PATIENT NAME: [name] [address] [place] [address] [phone] (home)  DOB: 1960/11/18 MRN: [id number]  PRIMARY CARE PROVIDER:    [name], NP,  [address] [place] [address] [phone]  REFERRING PROVIDER:   Martinique Blattenberger NP  RESPONSIBLE PARTY:  Self  Extended Emergency Contact Information Primary Emergency Contact: Chibueze, Beasley Mobile Phone: [phone] Relation: Brother  I met face to face with patient and family in home/facility.  ADVANCE CARE PLANNING/RECOMMENDATIONS/PLAN:    Visit at the request of Martinique Blattenberger NP  for palliative consult. Visit consisted of building trust and discussions on Palliative Medicine as specialized medical care for people living with serious illness, aimed at facilitating better quality of life through symptoms relief, assisting with advance care plan and establishing complex decision making.  Patient endorsed palliative service.  Discussion on the difference between Palliative and Hospice care. Palliative care and hospice have similar goals of managing symptoms, promoting comfort, improving quality of life, and maintaining a person's dignity. However, palliative care may be offered during any phase of a serious illness, while hospice care is usually offered when a person is expected to live for 6 months or less.  Visit consisted of counseling and education dealing with the complex and emotionally intense issues of symptom management and palliative care in the setting of serious and potentially life-threatening illness. Palliative care team will continue to support patient, patient's family, and medical team.  Advance Care Planning: Our advance care planning conversation included a discussion about:    The value and importance of advance care planning  Exploration of goals of care in the event of a sudden injury or illness  Identification and preparation of a healthcare agent           Review and updating or creation of an advance directive document       CODE STATUS: CODE STATUS reviewed today.  Patient said he wants to be a full code.  Facility records indicate patient is a full code.  Ramifications and implications of full CODE STATUS explained and patient affirmed he is a full code.  NP explained that in epic his CODE STATUS is DO NOT RESUSCITATE.  He said he is a full code and is willing to revisit the topic next visit.  GOALS OF CARE: Goals of care include to maximize quality of life and symptom management.  Follow up Palliative Care Visit: Palliative care will continue to follow for goals of care clarification and symptom management.   Symptom Management:  Left leg neuropathy: Continue gabapentin as ordered. Patient gets around on his electric scooter; recently completed PT OT.  Fall precautions discussed.  Nursing with no acute concerns; he is compliant with his medications. Palliative will continue to monitor for symptom management/decline and make recommendations as needed.   Family /Caregiver/Community Supports: Patient in SNF for ongoing care  I spent 1 hour and 16 minutes providing this initial consultation; time includes time spent with patient/family, chart review, provider coordination,  and documentation. More than 50% of the time in this consultation was spent on counseling patient and coordinating communication.   CHIEF COMPLAIN/HISTORY OF PRESENT ILLNESS:  [name] is a 59 y.o. male with multiple medical problems including hypertension, type 2 diabetes mellitus, recent hospitalization for Nephrotic syndrome, CKD 3B, hx of substance related disorder. History obtained from review of EMR, discussion with primary nurse and patient.   Palliative Care was asked to follow this patient by consultation request of Martinique Blattenberger  NP to help address advance care planning and complex decision making. Thank you for the opportunity to participate in the care of  [place]: Full  PPS: 40%  HOSPICE ELIGIBILITY/DIAGNOSIS: TBD  PAST MEDICAL HISTORY:  Past Medical History:  Diagnosis Date  . Arthritis   . Bipolar affective ([place])   . CHF (congestive heart failure) ([place])   . Depression   . Diabetes mellitus    uncontrolled   . Hypertension     SOCIAL HX:  Social History   Tobacco Use  . Smoking status: Former Research scientist (life sciences)  . Smokeless tobacco: Never Used  Substance Use Topics  . Alcohol use: No   FAMILY HX:  Family History  Problem Relation Age of Onset  . Diabetes Other   . Cancer Mother        uterus; deceased   . Heart attack Mother 78  . Hypertension Other   . Heart attack Father 56  . Stroke Sister        12/2012 age 5 y.o     Labs:   No results for input(s): WBC, HGB, HCT, PLT, MCV in the last 168 hours. No results for input(s): NA, K, CL, CO2, BUN, CREATININE, GLUCOSE in the last 168 hours.  ALLERGIES:  Allergies  Allergen Reactions  . Other Anaphylaxis, Itching and Swelling    Allergy to GRITS , unknown exactly the specific ingredient he is allergic too     PERTINENT MEDICATIONS:  Outpatient Encounter Medications as of 02/25/2020  Medication Sig  . amLODipine (NORVASC) 10 MG tablet Take 1 tablet (10 mg total) by mouth daily.  . insulin glargine (LANTUS SOLOSTAR) 100 UNIT/ML Solostar Pen Inject 20 Units into the skin at bedtime.  Marland Kitchen spironolactone (ALDACTONE) 50 MG tablet Take 1 tablet (50 mg total) by mouth daily.  . tamsulosin (FLOMAX) 0.4 MG CAPS capsule Take 2 capsules (0.8 mg total) by mouth daily.   No facility-administered encounter medications on file as of 02/25/2020.    PHYSICAL EXAM/ROS  General: NAD, cooperative Cardiovascular: regular rate and rhythm; denies chest pain Pulmonary: clear ant /post fields; normal respiratory effort Abdomen: soft, nontender, + bowel sounds Skin: no rashes to visible skin Neurological: Weakness but otherwise nonfocal  Note: Portions of this note  were generated with Lobbyist. Dictation errors may occur despite best attempts at proofreading.  Teodoro Spray, NP

## 2020-03-07 ENCOUNTER — Telehealth (INDEPENDENT_AMBULATORY_CARE_PROVIDER_SITE_OTHER): Payer: Self-pay | Admitting: Primary Care

## 2020-03-07 NOTE — Telephone Encounter (Signed)
Called to inform the doctor of the following: They are d/c patient from home health services Believes patient would benefit from referral  For OP neuro rehab on [address], Would like to report that BP has been elevated - Today - 160/100, yesterday - 170/100, Friday - 178/98.  Any questions, please call at [phone]

## 2020-03-08 ENCOUNTER — Emergency Department (HOSPITAL_COMMUNITY): Payer: Medicare HMO

## 2020-03-08 ENCOUNTER — Encounter (HOSPITAL_COMMUNITY): Payer: Self-pay

## 2020-03-08 ENCOUNTER — Emergency Department (HOSPITAL_COMMUNITY)
Admission: EM | Admit: 2020-03-08 | Discharge: 2020-03-09 | Disposition: A | Discharge status: Home / Self Care | Payer: Medicare HMO | Attending: Emergency Medicine | Admitting: Emergency Medicine

## 2020-03-08 DIAGNOSIS — E1140 Type 2 diabetes mellitus with diabetic neuropathy, unspecified: Secondary | ICD-10-CM | POA: Diagnosis not present

## 2020-03-08 DIAGNOSIS — Z87891 Personal history of nicotine dependence: Secondary | ICD-10-CM | POA: Insufficient documentation

## 2020-03-08 DIAGNOSIS — Z79899 Other long term (current) drug therapy: Secondary | ICD-10-CM | POA: Diagnosis not present

## 2020-03-08 DIAGNOSIS — U071 COVID-19: Secondary | ICD-10-CM | POA: Insufficient documentation

## 2020-03-08 DIAGNOSIS — R197 Diarrhea, unspecified: Secondary | ICD-10-CM

## 2020-03-08 DIAGNOSIS — N390 Urinary tract infection, site not specified: Secondary | ICD-10-CM

## 2020-03-08 DIAGNOSIS — I5032 Chronic diastolic (congestive) heart failure: Secondary | ICD-10-CM | POA: Insufficient documentation

## 2020-03-08 DIAGNOSIS — Z794 Long term (current) use of insulin: Secondary | ICD-10-CM | POA: Diagnosis not present

## 2020-03-08 DIAGNOSIS — I110 Hypertensive heart disease with heart failure: Secondary | ICD-10-CM | POA: Diagnosis not present

## 2020-03-08 DIAGNOSIS — R509 Fever, unspecified: Secondary | ICD-10-CM | POA: Diagnosis present

## 2020-03-08 DIAGNOSIS — K567 Ileus, unspecified: Secondary | ICD-10-CM

## 2020-03-08 LAB — URINALYSIS, ROUTINE W REFLEX MICROSCOPIC
Bilirubin Urine: NEGATIVE
Glucose, UA: >=500 mg/dL — AB
Hgb urine dipstick: NEGATIVE
Ketones, ur: NEGATIVE mg/dL
Nitrite: NEGATIVE
Protein, ur: >=300 mg/dL — AB
Specific Gravity, Urine: 1.016 (ref 1.005–1.030)
WBC, UA: >50 WBC/hpf — ABNORMAL HIGH (ref 0–5)
pH: 6 (ref 5.0–8.0)

## 2020-03-08 LAB — POC SARS CORONAVIRUS 2 AG -  ED: SARS Coronavirus 2 Ag: POSITIVE — AB

## 2020-03-08 LAB — COMPREHENSIVE METABOLIC PANEL
ALT: 12 U/L (ref 0–44)
AST: 17 U/L (ref 15–41)
Albumin: 2.2 g/dL — ABNORMAL LOW (ref 3.5–5.0)
Alkaline Phosphatase: 112 U/L (ref 38–126)
Anion gap: 9 (ref 5–15)
BUN: 16 mg/dL (ref 6–20)
CO2: 20 mmol/L — ABNORMAL LOW (ref 22–32)
Calcium: 8 mg/dL — ABNORMAL LOW (ref 8.9–10.3)
Chloride: 105 mmol/L (ref 98–111)
Creatinine, Ser: 1.23 mg/dL (ref 0.61–1.24)
GFR, Estimated: >60 mL/min (ref >60)
Glucose, Bld: 183 mg/dL — ABNORMAL HIGH (ref 70–99)
Potassium: 3.8 mmol/L (ref 3.5–5.1)
Sodium: 134 mmol/L — ABNORMAL LOW (ref 135–145)
Total Bilirubin: 0.4 mg/dL (ref 0.3–1.2)
Total Protein: 6.4 g/dL — ABNORMAL LOW (ref 6.5–8.1)

## 2020-03-08 LAB — CBC
HCT: 29.2 % — ABNORMAL LOW (ref 39.0–52.0)
Hemoglobin: 9 g/dL — ABNORMAL LOW (ref 13.0–17.0)
MCH: 25.3 pg — ABNORMAL LOW (ref 26.0–34.0)
MCHC: 30.8 g/dL (ref 30.0–36.0)
MCV: 82 fL (ref 80.0–100.0)
Platelets: 270 10*3/uL (ref 150–400)
RBC: 3.56 MIL/uL — ABNORMAL LOW (ref 4.22–5.81)
RDW: 15.6 % — ABNORMAL HIGH (ref 11.5–15.5)
WBC: 4.3 10*3/uL (ref 4.0–10.5)
nRBC: 0 % (ref 0.0–0.2)

## 2020-03-08 LAB — LIPASE, BLOOD: Lipase: 15 U/L (ref 11–51)

## 2020-03-08 LAB — LACTIC ACID, PLASMA: Lactic Acid, Venous: 0.8 mmol/L (ref 0.5–1.9)

## 2020-03-08 MED ORDER — CEPHALEXIN 500 MG PO CAPS: 500.0000 mg | ORAL_CAPSULE | Freq: Three times a day (TID) | ORAL | 0 refills | Status: DC

## 2020-03-08 MED ORDER — IOHEXOL 300 MG/ML  SOLN
100.0000 mL | Freq: Once | INTRAMUSCULAR | Status: AC | PRN
  Administered 2020-03-08: 100 mL via INTRAVENOUS

## 2020-03-08 MED ORDER — METRONIDAZOLE 500 MG PO TABS
500.0000 mg | ORAL_TABLET | Freq: Once | ORAL | Status: AC
  Administered 2020-03-08: 500 mg via ORAL
  Filled 2020-03-08: qty 1

## 2020-03-08 MED ORDER — SODIUM CHLORIDE 0.9 % IV SOLN
1.0000 g | Freq: Once | INTRAVENOUS | Status: AC
  Administered 2020-03-08: 1 g via INTRAVENOUS
  Filled 2020-03-08: qty 10

## 2020-03-08 MED ORDER — LORAZEPAM 2 MG/ML IJ SOLN: 0.5000 mg | Freq: Once | INTRAMUSCULAR | Status: DC | PRN

## 2020-03-08 MED ORDER — METRONIDAZOLE 500 MG PO TABS: 500.0000 mg | ORAL_TABLET | Freq: Two times a day (BID) | ORAL | 0 refills | Status: DC

## 2020-03-08 MED ORDER — ACETAMINOPHEN 500 MG PO TABS
1000.0000 mg | ORAL_TABLET | Freq: Once | ORAL | Status: AC
  Administered 2020-03-08: 1000 mg via ORAL
  Filled 2020-03-08: qty 2

## 2020-03-08 NOTE — ED Provider Notes (Signed)
[facility] DEPT Provider Note   CSN: [id number] Arrival date & time: 03/08/20  1913     History Chief Complaint  Patient presents with  . Fever  . Diarrhea    [name] is a 60 y.o. male hx of CHF, depression, DM, HTN, here with fever, diarrhea.  Patient states that he is at [facility] for rehab after his stroke.  He states that for the last day or 2 he has been having severe abdominal pain and diarrhea.  Patient also felt cold all the time and has subjective chills.  Patient also has pain with urination and left flank pain as well.  Patient denies any trouble breathing.  Patient states that he did receive 2 doses of his pfizer vaccines.   The history is provided by the patient.       Past Medical History:  Diagnosis Date  . Arthritis   . Bipolar affective ([place])   . CHF (congestive heart failure) ([place])   . Depression   . Diabetes mellitus    uncontrolled   . Hypertension     Patient Active Problem List   Diagnosis Date Noted  . Malnutrition of moderate degree 01/25/2020  . Palliative care by specialist   . Goals of care, counseling/discussion   . Nephrotic syndrome 01/06/2020  . Anasarca 01/06/2020  . Hypokalemia due to loss of potassium 01/04/2020  . Generalized body aches 01/01/2020  . Non-traumatic rhabdomyolysis 01/01/2020  . Grade I diastolic dysfunction 40/98/1191  . Anemia 01/01/2020  . Hypertensive urgency 12/31/2019  . Hypokalemia 12/31/2019  . Elevated CK   . Chronic diastolic CHF (congestive heart failure) ([place]) 12/31/2017  . Poorly controlled diabetes mellitus ([place])   . Pure hypercholesterolemia   . Hyperglycemia 09/28/2017  . Nonobstructive atherosclerosis of coronary artery 09/28/2017  . MDD (major depressive disorder), recurrent episode, severe ([place]) 07/30/2017  . Osteoarthritis 04/12/2015  . Severe recurrent major depression with psychotic features ([place]) 01/04/2015  . Substance-related disorder  ([place]) 10/30/2014  . Patient's noncompliance with other medical treatment and regimen 06/07/2014  . Cocaine dependence, uncomplicated ([place]) 47/82/9562  . Chronic pain 05/31/2014  . Cocaine abuse ([place]) 05/31/2014  . History of noncompliance with medical treatment 05/31/2014  . Malingering 05/31/2014  . Leukopenia 05/31/2014  . Diabetic neuropathy associated with diabetes mellitus due to underlying condition ([place]) 05/31/2014  . Diabetic neuropathy ([place]) 05/31/2014  . Congestive heart failure with left ventricular systolic dysfunction ([place]) 02/22/2014  . Depression, major, recurrent, severe with psychosis ([place]) 08/29/2013  . Suicidal ideations 08/28/2013  . Uncontrolled diabetes mellitus ([place]) 06/30/2013  . Atypical chest pain 12/09/2011  . Hypertension 12/09/2011  . Episodic mood disorder ([place]) 05/13/2011    Past Surgical History:  Procedure Laterality Date  . BOWEL DECOMPRESSION N/A 01/19/2020   Procedure: BOWEL DECOMPRESSION;  Surgeon: Otis Brace, MD;  Location: [facility];  Service: Gastroenterology;  Laterality: N/A;  . COLONOSCOPY WITH PROPOFOL N/A 01/19/2020   Procedure: COLONOSCOPY WITH PROPOFOL;  Surgeon: Otis Brace, MD;  Location: [facility];  Service: Gastroenterology;  Laterality: N/A;  . KNEE SURGERY     b/l knees; pending total knee        Family History  Problem Relation Age of Onset  . Diabetes Other   . Cancer Mother        uterus; deceased   . Heart attack Mother 31  . Hypertension Other   . Heart attack Father 61  . Stroke Sister  12/2012 age 7 y.o    Social History   Tobacco Use  . Smoking status: Former Research scientist (life sciences)  . Smokeless tobacco: Never Used  Vaping Use  . Vaping Use: Never used  Substance Use Topics  . Alcohol use: No  . Drug use: Yes    Types: Cocaine    Home Medications Prior to Admission medications   Medication Sig Start Date End Date Taking? Authorizing Provider  amLODipine (NORVASC) 10 MG tablet Take 1 tablet  (10 mg total) by mouth daily. 01/30/20 02/29/20  Arrien, Jimmy Picket, MD  insulin glargine (LANTUS SOLOSTAR) 100 UNIT/ML Solostar Pen Inject 20 Units into the skin at bedtime. 01/30/20   Arrien, Jimmy Picket, MD  spironolactone (ALDACTONE) 50 MG tablet Take 1 tablet (50 mg total) by mouth daily. 01/30/20 02/29/20  Arrien, Jimmy Picket, MD    Allergies    Other  Review of Systems   Review of Systems  Constitutional: Positive for fever.  Gastrointestinal: Positive for abdominal pain and diarrhea.  All other systems reviewed and are negative.   Physical Exam Updated Vital Signs BP (!) 160/96 (BP Location: Right Arm)   Pulse 97   Temp 99.9 F (37.7 C) (Oral)   Resp (!) 23   Ht 5\' 8"  (1.727 m)   Wt 110.7 kg   SpO2 99%   BMI 37.10 kg/m   Physical Exam Vitals and nursing note reviewed.  Constitutional:      Comments: Chronically ill, uncomfortable, shaking chills   HENT:     Head: Normocephalic.     Nose: Nose normal.     Mouth/Throat:     Mouth: Mucous membranes are dry.  Eyes:     Extraocular Movements: Extraocular movements intact.     Pupils: Pupils are equal, round, and reactive to light.  Cardiovascular:     Rate and Rhythm: Regular rhythm. Tachycardia present.     Pulses: Normal pulses.     Heart sounds: Normal heart sounds.  Pulmonary:     Comments: Dry crackles bilateral bases Abdominal:     General: Abdomen is flat.     Palpations: Abdomen is soft.     Comments: Mild left CVA tenderness and left lower quadrant tenderness  Musculoskeletal:        General: Normal range of motion.     Cervical back: Normal range of motion and neck supple.  Skin:    General: Skin is warm.     Capillary Refill: Capillary refill takes less than 2 seconds.  Neurological:     General: No focal deficit present.     Mental Status: He is oriented to person, place, and time.  Psychiatric:        Mood and Affect: Mood normal.        Behavior: Behavior normal.     ED Results /  Procedures / Treatments   Labs (all labs ordered are listed, but only abnormal results are displayed) Labs Reviewed  COMPREHENSIVE METABOLIC PANEL - Abnormal; Notable for the following components:      Result Value   Sodium 134 (*)    CO2 20 (*)    Glucose, Bld 183 (*)    Calcium 8.0 (*)    Total Protein 6.4 (*)    Albumin 2.2 (*)    All other components within normal limits  CBC - Abnormal; Notable for the following components:   RBC 3.56 (*)    Hemoglobin 9.0 (*)    HCT 29.2 (*)    MCH 25.3 (*)  RDW 15.6 (*)    All other components within normal limits  URINALYSIS, ROUTINE W REFLEX MICROSCOPIC - Abnormal; Notable for the following components:   APPearance TURBID (*)    Glucose, UA >=500 (*)    Protein, ur >=300 (*)    Leukocytes,Ua LARGE (*)    WBC, UA >50 (*)    Bacteria, UA RARE (*)    All other components within normal limits  POC SARS CORONAVIRUS 2 AG -  ED - Abnormal; Notable for the following components:   SARS Coronavirus 2 Ag POSITIVE (*)    All other components within normal limits  SARS CORONAVIRUS 2 (TAT 6-24 HRS)  URINE CULTURE  CULTURE, BLOOD (ROUTINE X 2)  CULTURE, BLOOD (ROUTINE X 2)  LIPASE, BLOOD  LACTIC ACID, PLASMA  LACTIC ACID, PLASMA    EKG None  Radiology DG Chest Port 1 View  Result Date: 03/08/2020 CLINICAL DATA:  Fever, cough EXAM: PORTABLE CHEST 1 VIEW COMPARISON:  12/31/2019 FINDINGS: Cardiomegaly with vascular congestion. Bilateral interstitial prominence. Patchy airspace disease in the right lung and left base. Findings could reflect edema/CHF or infection. No effusions or pneumothorax. No acute bony abnormality. IMPRESSION: Cardiomegaly with vascular congestion, interstitial prominence and patchy bilateral airspace disease, edema versus infection. Electronically Signed   By: Rolm Baptise M.D.   On: 03/08/2020 22:05    Procedures Procedures (including critical care time)  Medications Ordered in ED Medications  cefTRIAXone  (ROCEPHIN) 1 g in sodium chloride 0.9 % 100 mL IVPB (1 g Intravenous New Bag/Given 03/08/20 2150)  LORazepam (ATIVAN) injection 0.5 mg (has no administration in time range)  iohexol (OMNIPAQUE) 300 MG/ML solution 100 mL (has no administration in time range)  acetaminophen (TYLENOL) tablet 1,000 mg (1,000 mg Oral Given 03/08/20 2150)    ED Course  I have reviewed the triage vital signs and the nursing notes.  Pertinent labs & imaging results that were available during my care of the patient were reviewed by me and considered in my medical decision making (see chart for details).    MDM Rules/Calculators/A&P                          JACHIN COURY is a 60 y.o. male here presenting with abdominal pain and flank pain and dysuria and fever.  Consider pyelonephritis versus COVID versus pneumonia.  Will do sepsis work-up with CBC, CMP, lactate, cultures, UA, chest x-ray, CT abdomen pelvis  11:10 PM Patient's COVID is positive patient has no oxygen requirement and has no shortness of breath and is fully vaccinated. Patient also has a UTI as well.  CT showed ileus.  I tried to order C. difficile but patient unable to get any stool sample.  Patient was given Rocephin in the ED. Patient does not meet SIRS criteria right now, will discharge patient back to facility on Keflex and Flagyl.  Facility will be notified that he is COVID-positive and need to be in isolation  Final Clinical Impression(s) / ED Diagnoses Final diagnoses:  None    Rx / DC Orders ED Discharge Orders    None       Drenda Freeze, MD 03/08/20 2312

## 2020-03-08 NOTE — Discharge Instructions (Addendum)
You have a urine infection so please take Keflex as prescribed.  He also has a mild ileus and he is unable to give a stool sample for C. difficile prescribed Flagyl.  He also has COVID and needs to be in isolation for 10 days.  Keep him hydrated and give Tylenol and Motrin for fever  See your doctor for follow-up  Return to ER if he has trouble breathing, severe abdominal pain, vomiting, positive blood cultures.

## 2020-03-08 NOTE — ED Triage Notes (Signed)
Pt arrived via [facility] from [facility]. Pt has been having diarrhea for the past day. Pt also reports upper abdominal pain. Pt has a hx of HTN.  Temp: 100.4  EMS gave 1000 mg tylenol  BP: 210/150 HR: 111 O2: 99% RA CBG: 171

## 2020-03-08 NOTE — ED Notes (Signed)
Pt refused rectal temperature 

## 2020-03-09 LAB — SARS CORONAVIRUS 2 (TAT 6-24 HRS): SARS Coronavirus 2: POSITIVE — AB

## 2020-03-11 LAB — URINE CULTURE: Culture: >=100000 — AB

## 2020-03-12 NOTE — Progress Notes (Signed)
ED Antimicrobial Stewardship Positive Culture Follow Up   [name] is an 60 y.o. male who presented to [facility] on 03/08/2020 with a chief complaint of  Chief Complaint  Patient presents with  . Fever  . Diarrhea    Recent Results (from the past 720 hour(s))  Urine Culture     Status: Abnormal   Collection Time: 03/08/20  7:46 PM   Specimen: Urine, Random  Result Value Ref Range Status   Specimen Description   Final    URINE, RANDOM Performed at [facility] [address], [place], [place] [address]    Special Requests   Final    NONE Performed at [facility], [place] [address], [place], [place] [address]    Culture >=100,000 COLONIES/mL ENTEROCOCCUS FAECALIS (A)  Final   Report Status 03/11/2020 FINAL  Final   Organism ID, Bacteria ENTEROCOCCUS FAECALIS (A)  Final      Susceptibility   Enterococcus faecalis - MIC*    AMPICILLIN <=2 SENSITIVE Sensitive     NITROFURANTOIN <=16 SENSITIVE Sensitive     VANCOMYCIN 1 SENSITIVE Sensitive     * >=100,000 COLONIES/mL ENTEROCOCCUS FAECALIS  SARS CORONAVIRUS 2 (TAT 6-24 HRS) Nasopharyngeal Nasopharyngeal Swab     Status: Abnormal   Collection Time: 03/08/20  7:50 PM   Specimen: Nasopharyngeal Swab  Result Value Ref Range Status   SARS Coronavirus 2 POSITIVE (A) NEGATIVE Final    Comment: (NOTE) SARS-CoV-2 target nucleic acids are DETECTED.  The SARS-CoV-2 RNA is generally detectable in upper and lower respiratory specimens during the acute phase of infection. Positive results are indicative of the presence of SARS-CoV-2 RNA. Clinical correlation with patient history and other diagnostic information is  necessary to determine patient infection status. Positive results do not rule out bacterial infection or co-infection with other viruses.  The expected result is Negative.  Fact Sheet for Patients: SugarRoll.be  Fact Sheet for Healthcare  Providers: https://www.woods-mathews.com/  This test is not yet approved or cleared by the [place] FDA and  has been authorized for detection and/or diagnosis of SARS-CoV-2 by FDA under an Emergency Use Authorization (EUA). This EUA will remain  in effect (meaning this test can be used) for the duration of the COVID-19 declaration under Section 564(b)(1) of the Act, 21 U. S.C. section 360bbb-3(b)(1), unless the authorization is terminated or revoked sooner.   Performed at [facility], [place] [address], [place], [place] [address]   Blood culture (routine x 2)     Status: None (Preliminary result)   Collection Time: 03/08/20  9:39 PM   Specimen: BLOOD LEFT FOREARM  Result Value Ref Range Status   Specimen Description   Final    BLOOD LEFT FOREARM Performed at [facility], [place] [address] [place], [place] [address]    Special Requests   Final    BOTTLES DRAWN AEROBIC AND ANAEROBIC Blood Culture adequate volume Performed at [facility] [address], [place], [place] [address]    Culture   Final    NO GROWTH 4 DAYS Performed at [facility], [place] [address], [place], [place] [address]    Report Status PENDING  Incomplete  Blood culture (routine x 2)     Status: None (Preliminary result)   Collection Time: 03/08/20 10:01 PM   Specimen: BLOOD LEFT FOREARM  Result Value Ref Range Status   Specimen Description   Final    BLOOD LEFT FOREARM Performed  at [facility], [place] [address], [place], [place] [address]    Special Requests   Final    BOTTLES DRAWN AEROBIC AND ANAEROBIC Blood Culture adequate volume Performed at [facility] [address], [place], [place] [address]    Culture   Final    NO GROWTH 4 DAYS Performed at [facility], [place] [address] [place], [place] [address]    Report Status PENDING  Incomplete    [x]  Treated with cephalexin, organism resistant to prescribed  antimicrobial []  Patient discharged originally without antimicrobial agent and treatment is now indicated  New antibiotic prescription: To be determined by facility. Faxed culture results and spoke to staff at [facility] who will forward information to patient's provider.   ED Provider: Dr. Bari Edward, Tori Cupps D 03/12/2020, 9:14 AM Clinical Pharmacist [phone]

## 2020-03-13 LAB — CULTURE, BLOOD (ROUTINE X 2)
Culture: NO GROWTH
Culture: NO GROWTH
Special Requests: ADEQUATE
Special Requests: ADEQUATE

## 2020-03-20 ENCOUNTER — Other Ambulatory Visit (INDEPENDENT_AMBULATORY_CARE_PROVIDER_SITE_OTHER): Payer: Self-pay | Admitting: Primary Care

## 2020-03-20 DIAGNOSIS — E876 Hypokalemia: Secondary | ICD-10-CM

## 2020-03-21 ENCOUNTER — Encounter (HOSPITAL_COMMUNITY): Payer: Self-pay

## 2020-03-21 ENCOUNTER — Emergency Department (HOSPITAL_COMMUNITY)
Admission: EM | Admit: 2020-03-21 | Discharge: 2020-03-21 | Disposition: A | Discharge status: Home / Self Care | Payer: Medicare HMO | Attending: Emergency Medicine | Admitting: Emergency Medicine

## 2020-03-21 ENCOUNTER — Other Ambulatory Visit: Payer: Self-pay

## 2020-03-21 DIAGNOSIS — E1165 Type 2 diabetes mellitus with hyperglycemia: Secondary | ICD-10-CM | POA: Insufficient documentation

## 2020-03-21 DIAGNOSIS — I110 Hypertensive heart disease with heart failure: Secondary | ICD-10-CM | POA: Insufficient documentation

## 2020-03-21 DIAGNOSIS — Z794 Long term (current) use of insulin: Secondary | ICD-10-CM | POA: Diagnosis not present

## 2020-03-21 DIAGNOSIS — K649 Unspecified hemorrhoids: Secondary | ICD-10-CM | POA: Diagnosis not present

## 2020-03-21 DIAGNOSIS — I5032 Chronic diastolic (congestive) heart failure: Secondary | ICD-10-CM | POA: Diagnosis not present

## 2020-03-21 DIAGNOSIS — K6289 Other specified diseases of anus and rectum: Secondary | ICD-10-CM | POA: Diagnosis present

## 2020-03-21 DIAGNOSIS — Z79899 Other long term (current) drug therapy: Secondary | ICD-10-CM | POA: Insufficient documentation

## 2020-03-21 DIAGNOSIS — Z87891 Personal history of nicotine dependence: Secondary | ICD-10-CM | POA: Diagnosis not present

## 2020-03-21 DIAGNOSIS — K644 Residual hemorrhoidal skin tags: Secondary | ICD-10-CM

## 2020-03-21 DIAGNOSIS — E1140 Type 2 diabetes mellitus with diabetic neuropathy, unspecified: Secondary | ICD-10-CM | POA: Insufficient documentation

## 2020-03-21 MED ORDER — NYSTATIN 100000 UNIT/GM EX POWD
Freq: Once | CUTANEOUS | Status: AC
  Filled 2020-03-21: qty 15

## 2020-03-21 MED ORDER — POLYETHYLENE GLYCOL 3350 17 G PO PACK: 17.0000 g | PACK | Freq: Every day | ORAL | 0 refills | Status: DC

## 2020-03-21 MED ORDER — HYDROCORTISONE ACETATE 25 MG RE SUPP
25.0000 mg | Freq: Once | RECTAL | Status: DC
  Filled 2020-03-21: qty 1

## 2020-03-21 MED ORDER — HYDROCORT-PRAMOXINE (PERIANAL) 1-1 % EX FOAM: 1.0000 | Freq: Three times a day (TID) | CUTANEOUS | 0 refills | Status: DC

## 2020-03-21 MED ORDER — NYSTATIN 100000 UNIT/GM EX POWD: 1.0000 "application " | Freq: Three times a day (TID) | CUTANEOUS | 0 refills | Status: DC

## 2020-03-21 NOTE — ED Triage Notes (Signed)
[facility] with complaints of hemorrhoid pain over the last two days.

## 2020-03-21 NOTE — ED Notes (Signed)
Pt states he cannot walk, pt uses cane and wheelchair at home. Pt lives at [facility] and is requesting transport back. PT[facility] called.

## 2020-03-21 NOTE — ED Notes (Signed)
An After Visit Summary was printed and given to the patient. Discharge instructions given and no further questions at this time.  

## 2020-03-21 NOTE — ED Notes (Signed)
An After Visit Summary was printed and given to the patient. Discharge instructions given and no further questions at this time.  Per Hanley Seamen, Charge RN- patient is to wait for PTAR in lobby. Pt assisted to wheelchair, pt currently sitting in lobby waiting for PTAR.

## 2020-03-21 NOTE — ED Provider Notes (Addendum)
[facility] DEPT Provider Note   CSN: [id number] Arrival date & time: 03/21/20  0160     History Chief Complaint  Patient presents with  . Hemorrhoids    [name] is a 60 y.o. male.  The history is provided by the patient.  Illness Location:  Rectum Quality:  Pain Severity:  Mild Onset quality:  Gradual Timing:  Intermittent Progression:  Waxing and waning Chronicity:  New Context:  Pain with bowel movement, hemorrhoids hisotry, having hard stools Relieved by:  Nothing Worsened by:  Bowel movments Associated symptoms: no abdominal pain, no diarrhea, no fever, no myalgias, no nausea, no sore throat and no vomiting        Past Medical History:  Diagnosis Date  . Arthritis   . Bipolar affective ([place])   . CHF (congestive heart failure) ([place])   . Depression   . Diabetes mellitus    uncontrolled   . Hypertension     Patient Active Problem List   Diagnosis Date Noted  . Malnutrition of moderate degree 01/25/2020  . Palliative care by specialist   . Goals of care, counseling/discussion   . Nephrotic syndrome 01/06/2020  . Anasarca 01/06/2020  . Hypokalemia due to loss of potassium 01/04/2020  . Generalized body aches 01/01/2020  . Non-traumatic rhabdomyolysis 01/01/2020  . Grade I diastolic dysfunction 10/93/2355  . Anemia 01/01/2020  . Hypertensive urgency 12/31/2019  . Hypokalemia 12/31/2019  . Elevated CK   . Chronic diastolic CHF (congestive heart failure) ([place]) 12/31/2017  . Poorly controlled diabetes mellitus ([place])   . Pure hypercholesterolemia   . Hyperglycemia 09/28/2017  . Nonobstructive atherosclerosis of coronary artery 09/28/2017  . MDD (major depressive disorder), recurrent episode, severe ([place]) 07/30/2017  . Osteoarthritis 04/12/2015  . Severe recurrent major depression with psychotic features ([place]) 01/04/2015  . Substance-related disorder ([place]) 10/30/2014  . Patient's noncompliance with other medical  treatment and regimen 06/07/2014  . Cocaine dependence, uncomplicated ([place]) 73/22/0254  . Chronic pain 05/31/2014  . Cocaine abuse ([place]) 05/31/2014  . History of noncompliance with medical treatment 05/31/2014  . Malingering 05/31/2014  . Leukopenia 05/31/2014  . Diabetic neuropathy associated with diabetes mellitus due to underlying condition ([name]) 05/31/2014  . Diabetic neuropathy ([place]) 05/31/2014  . Congestive heart failure with left ventricular systolic dysfunction ([place]) 02/22/2014  . Depression, major, recurrent, severe with psychosis ([place]) 08/29/2013  . Suicidal ideations 08/28/2013  . Uncontrolled diabetes mellitus ([place]) 06/30/2013  . Atypical chest pain 12/09/2011  . Hypertension 12/09/2011  . Episodic mood disorder ([place]) 05/13/2011    Past Surgical History:  Procedure Laterality Date  . BOWEL DECOMPRESSION N/A 01/19/2020   Procedure: BOWEL DECOMPRESSION;  Surgeon: Otis Brace, MD;  Location: [facility];  Service: Gastroenterology;  Laterality: N/A;  . COLONOSCOPY WITH PROPOFOL N/A 01/19/2020   Procedure: COLONOSCOPY WITH PROPOFOL;  Surgeon: Otis Brace, MD;  Location: [facility];  Service: Gastroenterology;  Laterality: N/A;  . KNEE SURGERY     b/l knees; pending total knee        Family History  Problem Relation Age of Onset  . Diabetes Other   . Cancer Mother        uterus; deceased   . Heart attack Mother 72  . Hypertension Other   . Heart attack Father 66  . Stroke Sister        12/2012 age 30 y.o    Social History   Tobacco Use  . Smoking status: Former Research scientist (life sciences)  . Smokeless tobacco: Never Used  Vaping Use  . Vaping Use: Never used  Substance Use Topics  . Alcohol use: No  . Drug use: Yes    Types: Cocaine    Home Medications Prior to Admission medications   Medication Sig Start Date End Date Taking? Authorizing Provider  hydrocortisone-pramoxine [facility]) rectal foam Place 1 applicator rectally 3 (three) times daily for 13  days. 03/21/20 04/03/20 Yes Garima Chronis, DO  polyethylene glycol (MIRALAX / GLYCOLAX) 17 g packet Take 17 g by mouth daily. 03/21/20  Yes Satori Krabill, DO  amLODipine (NORVASC) 10 MG tablet Take 1 tablet (10 mg total) by mouth daily. 01/30/20 02/29/20  Arrien, Jimmy Picket, MD  cephALEXin (KEFLEX) 500 MG capsule Take 1 capsule (500 mg total) by mouth 3 (three) times daily. 03/08/20   Drenda Freeze, MD  insulin glargine (LANTUS SOLOSTAR) 100 UNIT/ML Solostar Pen Inject 20 Units into the skin at bedtime. 01/30/20   Arrien, Jimmy Picket, MD  metroNIDAZOLE (FLAGYL) 500 MG tablet Take 1 tablet (500 mg total) by mouth 2 (two) times daily. One po bid x 7 days 03/08/20   Drenda Freeze, MD  spironolactone (ALDACTONE) 50 MG tablet Take 1 tablet (50 mg total) by mouth daily. 01/30/20 02/29/20  Arrien, Jimmy Picket, MD    Allergies    Other  Review of Systems   Review of Systems  Constitutional: Negative for fever.  HENT: Negative for sore throat.   Gastrointestinal: Positive for constipation and rectal pain. Negative for abdominal distention, abdominal pain, anal bleeding, blood in stool, diarrhea, nausea and vomiting.  Musculoskeletal: Negative for myalgias.  Skin: Negative for wound.    Physical Exam Updated Vital Signs BP (!) 147/97   Pulse 88   Temp 97.6 F (36.4 C) (Oral)   Resp 18   SpO2 100%   Physical Exam Constitutional:      General: He is not in acute distress.    Appearance: He is not ill-appearing.  Abdominal:     General: Abdomen is flat. There is no distension.     Tenderness: There is no abdominal tenderness.  Genitourinary:    Penis: Normal.      Testes: Normal.     Comments: Small external hemorrhoid on rectal exam, no tenderness or fluctuance around perianal area, there is no crepitus in the perineum or cellulitic changes, no obvious abscess, patient also with some increased moisture in the inguinal folds may be mild yeast Skin:    Capillary Refill:  Capillary refill takes less than 2 seconds.  Neurological:     Mental Status: He is alert.     ED Results / Procedures / Treatments   Labs (all labs ordered are listed, but only abnormal results are displayed) Labs Reviewed - No data to display  EKG None  Radiology No results found.  Procedures Procedures   Medications Ordered in ED Medications - No data to display  ED Course  I have reviewed the triage vital signs and the nursing notes.  Pertinent labs & imaging results that were available during my care of the patient were reviewed by me and considered in my medical decision making (see chart for details).    MDM Rules/Calculators/A&P                          [name] is a 60 year old male with history of bipolar, diabetes who presents to the ED with rectal pain, hemorrhoids.  Overall normal vitals.  No fever.  Patient  overall well-appearing.  Has small external hemorrhoid.  No fluctuance around the perianal area and no concern for abscess.  No concern for deep space infection as there is no crepitus or cellulitic or tenderness around the perineum.  Overall suspect constipation leading to hemorrhoids.  Will prescribe MiraLAX and Proctofoam. Also maybe has a mild yeast infection in his inguinal fold bilaterally and will prescribe nystatin. Educated about increased hydration and given return precautions.  Discharged in good condition.  This chart was dictated using voice recognition software.  Despite best efforts to proofread,  errors can occur which can change the documentation meaning.    Final Clinical Impression(s) / ED Diagnoses Final diagnoses:  External hemorrhoid  Rectal pain    Rx / DC Orders ED Discharge Orders         Ordered    polyethylene glycol (MIRALAX / GLYCOLAX) 17 g packet  Daily        03/21/20 1127    hydrocortisone-pramoxine (PROCTOFOAM-HC) rectal foam  3 times daily        03/21/20 1127           [place] Sky[place], DO 03/21/20 [place], [place], DO 03/21/20 1201

## 2020-03-30 ENCOUNTER — Encounter (INDEPENDENT_AMBULATORY_CARE_PROVIDER_SITE_OTHER): Payer: Self-pay | Admitting: Primary Care

## 2020-03-30 ENCOUNTER — Other Ambulatory Visit: Payer: Self-pay

## 2020-03-30 ENCOUNTER — Ambulatory Visit (INDEPENDENT_AMBULATORY_CARE_PROVIDER_SITE_OTHER): Payer: Medicare HMO | Admitting: Primary Care

## 2020-03-30 VITALS — BP 185/102 | HR 91

## 2020-03-30 DIAGNOSIS — I5032 Chronic diastolic (congestive) heart failure: Secondary | ICD-10-CM | POA: Diagnosis not present

## 2020-03-30 DIAGNOSIS — I10 Essential (primary) hypertension: Secondary | ICD-10-CM

## 2020-03-30 DIAGNOSIS — N184 Chronic kidney disease, stage 4 (severe): Secondary | ICD-10-CM

## 2020-03-30 DIAGNOSIS — E440 Moderate protein-calorie malnutrition: Secondary | ICD-10-CM | POA: Diagnosis not present

## 2020-03-30 DIAGNOSIS — K649 Unspecified hemorrhoids: Secondary | ICD-10-CM

## 2020-03-30 DIAGNOSIS — F1999 Other psychoactive substance use, unspecified with unspecified psychoactive substance-induced disorder: Secondary | ICD-10-CM | POA: Diagnosis not present

## 2020-03-30 DIAGNOSIS — K5900 Constipation, unspecified: Secondary | ICD-10-CM

## 2020-03-30 DIAGNOSIS — N400 Enlarged prostate without lower urinary tract symptoms: Secondary | ICD-10-CM

## 2020-03-30 DIAGNOSIS — E1165 Type 2 diabetes mellitus with hyperglycemia: Secondary | ICD-10-CM

## 2020-03-30 DIAGNOSIS — N183 Chronic kidney disease, stage 3 unspecified: Secondary | ICD-10-CM | POA: Insufficient documentation

## 2020-03-30 LAB — POCT GLYCOSYLATED HEMOGLOBIN (HGB A1C): Hemoglobin A1C: 8.2 % — AB (ref 4.0–5.6)

## 2020-03-30 MED ORDER — NYSTATIN 100000 UNIT/GM EX POWD: 1.0000 "application " | Freq: Three times a day (TID) | CUTANEOUS | 3 refills | Status: DC

## 2020-03-30 MED ORDER — INSULIN ASPART 100 UNIT/ML ~~LOC~~ SOLN: 12.0000 [IU] | Freq: Three times a day (TID) | SUBCUTANEOUS | 99 refills | Status: DC

## 2020-03-30 MED ORDER — LANTUS SOLOSTAR 100 UNIT/ML ~~LOC~~ SOPN: 20.0000 [IU] | PEN_INJECTOR | Freq: Every day | SUBCUTANEOUS | 6 refills | Status: DC

## 2020-03-30 MED ORDER — HYDROCORT-PRAMOXINE (PERIANAL) 1-1 % EX FOAM: 1.0000 | Freq: Three times a day (TID) | CUTANEOUS | 12 refills | Status: AC

## 2020-03-30 MED ORDER — TAMSULOSIN HCL 0.4 MG PO CAPS: 0.4000 mg | ORAL_CAPSULE | Freq: Every day | ORAL | 1 refills | Status: DC

## 2020-03-30 MED ORDER — POLYETHYLENE GLYCOL 3350 17 G PO PACK: 17.0000 g | PACK | Freq: Every day | ORAL | 11 refills | Status: DC | PRN

## 2020-03-30 MED ORDER — METFORMIN HCL 1000 MG PO TABS
1000.0000 mg | ORAL_TABLET | Freq: Two times a day (BID) | ORAL | 3 refills | Status: DC
Start: 2020-03-30 — End: 2020-06-28

## 2020-03-30 MED ORDER — LISINOPRIL-HYDROCHLOROTHIAZIDE 20-25 MG PO TABS
1.0000 | ORAL_TABLET | Freq: Every day | ORAL | 3 refills | Status: DC
Start: 2020-03-30 — End: 2020-06-28

## 2020-03-30 MED ORDER — ATORVASTATIN CALCIUM 20 MG PO TABS: 20.0000 mg | ORAL_TABLET | Freq: Every day | ORAL | 3 refills | Status: DC

## 2020-03-30 MED ORDER — AMLODIPINE BESYLATE 10 MG PO TABS: 10.0000 mg | ORAL_TABLET | Freq: Every day | ORAL | 1 refills | Status: DC

## 2020-03-30 NOTE — Progress Notes (Signed)
Established Patient Office Visit  Subjective:  Patient ID: [name], male    DOB: [birth year]  Age: 60 y.o. MRN: [id number]  CC:  Chief Complaint  Patient presents with  . Diabetes    HPI [name] presents for 60 y.o.male presents to the emergency room on was 03/21/20, External hemorrhoid Rectal pain COVID positive 3 weeks ago and during intake answer No to all question about testing and symptoms. Roomed and been coughing since arrival. In today for the management of uncontrolled diabetes type 2 Hypoglycemic episodes:no Polydipsia/polyuria: yes, Visual disturbance: no, Chest pain: no, Paresthesias: no, Glucose Monitoring: yes. Denies shortness of breath, headaches, chest pain or lower extremity edema    Past Medical History:  Diagnosis Date  . Arthritis   . Bipolar affective ([place])   . CHF (congestive heart failure) ([place])   . Depression   . Diabetes mellitus    uncontrolled   . Hypertension     Past Surgical History:  Procedure Laterality Date  . BOWEL DECOMPRESSION N/A 01/19/2020   Procedure: BOWEL DECOMPRESSION;  Surgeon: Otis Brace, MD;  Location: [facility];  Service: Gastroenterology;  Laterality: N/A;  . COLONOSCOPY WITH PROPOFOL N/A 01/19/2020   Procedure: COLONOSCOPY WITH PROPOFOL;  Surgeon: Otis Brace, MD;  Location: [facility];  Service: Gastroenterology;  Laterality: N/A;  . KNEE SURGERY     b/l knees; pending total knee     Family History  Problem Relation Age of Onset  . Diabetes Other   . Cancer Mother        uterus; deceased   . Heart attack Mother 25  . Hypertension Other   . Heart attack Father 32  . Stroke Sister        12/2012 age 8 y.o    Social History   Socioeconomic History  . Marital status: Single    Spouse name: Not on file  . Number of children: Not on file  . Years of education: Not on file  . Highest education level: Not on file  Occupational History  . Not on file  Tobacco Use  . Smoking status:  Former Research scientist (life sciences)  . Smokeless tobacco: Never Used  Vaping Use  . Vaping Use: Never used  Substance and Sexual Activity  . Alcohol use: No  . Drug use: Yes    Types: Cocaine  . Sexual activity: Yes    Birth control/protection: Condom  Other Topics Concern  . 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 cephALEXin (KEFLEX) 500 MG capsule Take 1 capsule (500 mg total) by mouth 3 (three) times daily. 21 capsule 0  . metroNIDAZOLE (FLAGYL) 500 MG tablet Take 1 tablet (500 mg total) by mouth 2 (two) times daily. One po bid x 7 days  14 tablet 0  . hydrocortisone-pramoxine (PROCTOFOAM-HC) rectal foam Place 1 applicator rectally 3 (three) times daily for 13 days. 10 g 0  . insulin glargine (LANTUS SOLOSTAR) 100 UNIT/ML Solostar Pen Inject 20 Units into the skin at bedtime. 15 mL 0  . nystatin (MYCOSTATIN/NYSTOP) powder Apply 1 application topically 3 (three) times daily. Apply to groin area 15 g 0  . polyethylene glycol (MIRALAX / GLYCOLAX) 17 g packet Take 17 g by mouth daily. 14 each 0  . amLODipine (NORVASC) 10 MG tablet Take 1 tablet (10 mg total) by mouth daily. 30 tablet 0  . spironolactone (ALDACTONE) 50 MG tablet Take 1 tablet (50 mg total) by mouth daily. 30 tablet 0   No facility-administered medications prior to visit.    Allergies  Allergen Reactions  . Other Anaphylaxis, Itching and Swelling     Allergy to GRITS , unknown exactly the specific ingredient he is allergic too    ROS Review of Systems Pertinent positive and negative in HPI   Objective:    Physical Exam  BP (!) 185/102 (BP Location: Right Arm, Patient Position: Sitting)   Pulse 91   SpO2 100%  Wt Readings from Last 3 Encounters:  03/08/20 244 lb (110.7 kg)  01/25/20 246 lb 0.5 oz (111.6 kg)  12/12/19 244 lb (110.7 kg)    BP (!) 190/114 (BP Location: Left Arm, Patient Position: Sitting)   Pulse 91   SpO2 100%  General Appearance: Well nourished, obese male in no apparent distress. Eyes:, EOMs,  Sinuses: No Frontal/maxillary tenderness ENT/Mouth: Ext ear and hearing normal.  Neck: Supple, thyroid normal.  Respiratory: Respiratory effort normal, BS equal bilaterally without rales, rhonchi, wheezing or stridor.  Cardio: RRR with no MRGs. Brisk peripheral pulses without edema.  Abdomen: Soft, + BS.  Non tender, no guarding, rebound, hernias, masses. Lymphatics: Non tender without lymphadenopathy.  Musculoskeletal: arthralgias, back pain, gait problem, neck pain and neck stiffness Skin: Warm, dry without rashes, lesions, ecchymosis.  Neuro: Cranial nerves intact. Normal muscle tone, no cerebellar symptoms. Sensation intact.  Psych: Awake and oriented X 3, normal affect, Insight and Judgment appropriate.    Health Maintenance Due  Topic Date Due  . PNEUMOCOCCAL POLYSACCHARIDE VACCINE AGE 61-64 HIGH RISK  Never done  . FOOT EXAM  Never done  . OPHTHALMOLOGY EXAM  Never done  . COVID-19 Vaccine (2 - Pfizer 3-dose series) 01/24/2020    There are no preventive care reminders to display for this patient.  Lab Results  Component Value Date   TSH 2.317 01/18/2020   Lab Results  Component Value Date   WBC 4.3 03/08/2020   HGB 9.0 (L) 03/08/2020   HCT 29.2 (L) 03/08/2020   MCV 82.0 03/08/2020   PLT 270 03/08/2020   Lab Results  Component Value Date   NA 134 (L) 03/08/2020   K 3.8 03/08/2020   CO2 20  (L) 03/08/2020   GLUCOSE 183 (H) 03/08/2020   BUN 16 03/08/2020   CREATININE 1.23 03/08/2020   BILITOT 0.4 03/08/2020   ALKPHOS 112 03/08/2020   AST 17 03/08/2020   ALT 12 03/08/2020   PROT 6.4 (L) 03/08/2020   ALBUMIN 2.2 (L) 03/08/2020   CALCIUM 8.0 (L) 03/08/2020   ANIONGAP 9 03/08/2020   Lab Results  Component Value Date   CHOL 208 (H) 09/29/2017   Lab Results  Component Value Date   HDL 39 (L) 09/29/2017   Lab Results  Component Value Date   LDLCALC 153 (H) 09/29/2017  Lab Results  Component Value Date   TRIG 71 01/26/2020   Lab Results  Component Value Date   CHOLHDL 5.3 09/29/2017   Lab Results  Component Value Date   HGBA1C 8.2 (A) 03/30/2020     Uncontrolled type 2 diabetes mellitus with hyperglycemia (HCC) : Goal of therapy: Less than 6.5 hemoglobin A1c.  Monitor foods that are high in carbohydrates are the following rice, potatoes, breads, sugars, and pastas.  Reduction in the intake (eating) will assist in lowering your blood sugars.(low Na/carb diet) -     insulin glargine (LANTUS SOLOSTAR) 100 UNIT/ML Solostar Pen; Inject 20 Units into the skin at bedtime. -     metFORMIN (GLUCOPHAGE) 1000 MG tablet; Take 1 tablet (1,000 mg total) by mouth 2 (two) times daily with a meal. -     insulin aspart (NOVOLOG) 100 UNIT/ML injection; Inject 12 Units into the skin 3 (three) times daily before meals. For blood sugars 0-150 give 0 units of insulin, 151-200 give 2 units of insulin, 201-250 give 4 units, 251-300 give 6 units, 301-350 give 8 units, 351-400 give 10 units,> 400 give 12 units and call M.D.  Chronic kidney disease, stage 4 (severe) (HCC) Followed by nephrology Dr. Marval Regal   Morbid (severe) obesity due to excess calories ([place]) Obesity is 30-39 indicating an excess in caloric intake or underlining conditions. This may lead to other co-morbidities. Lifestyle modifications of diet and exercise is limited to mobility   Hemorrhoids, unspecified hemorrhoid  type -     hydrocortisone-pramoxine (PROCTOFOAM-HC) rectal foam; Place 1 applicator rectally 3 (three) times daily for 13 days.  Constipation, unspecified constipation type -     polyethylene glycol (MIRALAX / GLYCOLAX) 17 g packet; Take 17 g by mouth daily as needed.  Benign prostatic hyperplasia, unspecified whether lower urinary tract symptoms present -     tamsulosin (FLOMAX) 0.4 MG CAPS capsule; Take 1 capsule (0.4 mg total) by mouth daily.    Follow-up: Return in about 4 weeks (around 04/27/2020) for Have facity to call with Bp readings inperson 3 months DM/HTN .    [name], NP

## 2020-03-30 NOTE — Progress Notes (Signed)
Diabetes f/u  Back pain for 4 weeks Needs refill on nystatin

## 2020-04-15 ENCOUNTER — Emergency Department (HOSPITAL_COMMUNITY): Payer: Medicare HMO

## 2020-04-15 ENCOUNTER — Other Ambulatory Visit: Payer: Self-pay

## 2020-04-15 ENCOUNTER — Emergency Department (HOSPITAL_COMMUNITY)
Admission: EM | Admit: 2020-04-15 | Discharge: 2020-04-16 | Disposition: A | Discharge status: Home / Self Care | Payer: Medicare HMO | Attending: Emergency Medicine | Admitting: Emergency Medicine

## 2020-04-15 DIAGNOSIS — N184 Chronic kidney disease, stage 4 (severe): Secondary | ICD-10-CM | POA: Insufficient documentation

## 2020-04-15 DIAGNOSIS — Z794 Long term (current) use of insulin: Secondary | ICD-10-CM | POA: Insufficient documentation

## 2020-04-15 DIAGNOSIS — E1165 Type 2 diabetes mellitus with hyperglycemia: Secondary | ICD-10-CM | POA: Insufficient documentation

## 2020-04-15 DIAGNOSIS — I130 Hypertensive heart and chronic kidney disease with heart failure and stage 1 through stage 4 chronic kidney disease, or unspecified chronic kidney disease: Secondary | ICD-10-CM | POA: Insufficient documentation

## 2020-04-15 DIAGNOSIS — W268XXA Contact with other sharp object(s), not elsewhere classified, initial encounter: Secondary | ICD-10-CM | POA: Diagnosis not present

## 2020-04-15 DIAGNOSIS — Z7984 Long term (current) use of oral hypoglycemic drugs: Secondary | ICD-10-CM | POA: Diagnosis not present

## 2020-04-15 DIAGNOSIS — E1140 Type 2 diabetes mellitus with diabetic neuropathy, unspecified: Secondary | ICD-10-CM | POA: Diagnosis not present

## 2020-04-15 DIAGNOSIS — S91311A Laceration without foreign body, right foot, initial encounter: Secondary | ICD-10-CM

## 2020-04-15 DIAGNOSIS — S99921A Unspecified injury of right foot, initial encounter: Secondary | ICD-10-CM | POA: Diagnosis present

## 2020-04-15 DIAGNOSIS — Z87891 Personal history of nicotine dependence: Secondary | ICD-10-CM | POA: Diagnosis not present

## 2020-04-15 DIAGNOSIS — E1122 Type 2 diabetes mellitus with diabetic chronic kidney disease: Secondary | ICD-10-CM | POA: Insufficient documentation

## 2020-04-15 DIAGNOSIS — Z79899 Other long term (current) drug therapy: Secondary | ICD-10-CM | POA: Diagnosis not present

## 2020-04-15 DIAGNOSIS — I5032 Chronic diastolic (congestive) heart failure: Secondary | ICD-10-CM | POA: Diagnosis not present

## 2020-04-15 DIAGNOSIS — S91114A Laceration without foreign body of right lesser toe(s) without damage to nail, initial encounter: Secondary | ICD-10-CM | POA: Diagnosis not present

## 2020-04-15 MED ORDER — DOXYCYCLINE HYCLATE 100 MG PO CAPS: 100.0000 mg | ORAL_CAPSULE | Freq: Two times a day (BID) | ORAL | 0 refills | Status: DC

## 2020-04-15 MED ORDER — LIDOCAINE HCL 2 % IJ SOLN
5.0000 mL | Freq: Once | INTRAMUSCULAR | Status: AC
  Administered 2020-04-15: 100 mg
  Filled 2020-04-15: qty 20

## 2020-04-15 MED ORDER — DOXYCYCLINE HYCLATE 100 MG PO TABS
100.0000 mg | ORAL_TABLET | Freq: Once | ORAL | Status: AC
  Administered 2020-04-15: 100 mg via ORAL
  Filled 2020-04-15: qty 1

## 2020-04-15 MED ORDER — LIDOCAINE-EPINEPHRINE-TETRACAINE (LET) TOPICAL GEL
3.0000 mL | Freq: Once | TOPICAL | Status: AC
  Administered 2020-04-15: 3 mL via TOPICAL
  Filled 2020-04-15: qty 3

## 2020-04-15 NOTE — Discharge Instructions (Addendum)
Please keep your foot wound clean and dry. It needs to be washed once daily and a thin layer of antibiotic ointment should be applied to the wound in a clean dressing placed. Your sutures should come out in the next 10 to 14 days. This can happen at your doctor's office or urgent care.

## 2020-04-15 NOTE — ED Notes (Signed)
Ortho tech at bedside 

## 2020-04-15 NOTE — ED Triage Notes (Signed)
Per EMS, from [facility], pt was moving to turn on heater and sliced the bottom of his foot on the bed. Per EMS  PT feels neglected at facility due to not getting rehab and is not getting meals as he should. PT called 911. HX diabetes  CBG 222 194/98 96 HR 98 RA

## 2020-04-15 NOTE — ED Provider Notes (Signed)
[facility] DEPT Provider Note   CSN: [id number] Arrival date & time: 04/15/20  1845     History No chief complaint on file.   [name] is a 60 y.o. male.  The history is provided by the patient and medical records.   [name] is a 60 y.o. male who presents to the Emergency Department complaining of foot injury. He is a resident of [facility]. He presents the emergency department after cutting his right foot. He ambulates for the most part in a wheelchair. He does stand to pivot and transfer. He was in his wheelchair and socks and attempting to adjust the heater when his foot struck a metal piece of his bed. He developed immediate bleeding to that area. He does have some mild local pain. He is diabetic. Symptoms are moderate and constant nature. Denies any recent illnesses. Symptoms are mild and constant.    Past Medical History:  Diagnosis Date  . Arthritis   . Bipolar affective ([place])   . CHF (congestive heart failure) ([place])   . Depression   . Diabetes mellitus    uncontrolled   . Hypertension     Patient Active Problem List   Diagnosis Date Noted  . Chronic kidney disease, stage 4 (severe) ([place]) 03/30/2020  . Morbid (severe) obesity due to excess calories ([place]) 03/30/2020  . Malnutrition of moderate degree 01/25/2020  . Palliative care by specialist   . Goals of care, counseling/discussion   . Nephrotic syndrome 01/06/2020  . Anasarca 01/06/2020  . Hypokalemia due to loss of potassium 01/04/2020  . Generalized body aches 01/01/2020  . Non-traumatic rhabdomyolysis 01/01/2020  . Grade I diastolic dysfunction 73/22/0254  . Anemia 01/01/2020  . Hypertensive urgency 12/31/2019  . Hypokalemia 12/31/2019  . Elevated CK   . Chronic diastolic CHF (congestive heart failure) ([place]) 12/31/2017  . Poorly controlled diabetes mellitus ([place])   . Pure hypercholesterolemia   . Hyperglycemia 09/28/2017  . Nonobstructive atherosclerosis of  coronary artery 09/28/2017  . MDD (major depressive disorder), recurrent episode, severe ([place]) 07/30/2017  . Osteoarthritis 04/12/2015  . Severe recurrent major depression with psychotic features ([place]) 01/04/2015  . Substance-related disorder ([place]) 10/30/2014  . Patient's noncompliance with other medical treatment and regimen 06/07/2014  . Cocaine dependence, uncomplicated ([place]) 27/07/2374  . Chronic pain 05/31/2014  . Cocaine abuse ([place]) 05/31/2014  . History of noncompliance with medical treatment 05/31/2014  . Malingering 05/31/2014  . Leukopenia 05/31/2014  . Diabetic neuropathy associated with diabetes mellitus due to underlying condition ([place]) 05/31/2014  . Diabetic neuropathy ([place]) 05/31/2014  . Congestive heart failure with left ventricular systolic dysfunction ([place]) 02/22/2014  . Depression, major, recurrent, severe with psychosis ([place]) 08/29/2013  . Suicidal ideations 08/28/2013  . Uncontrolled diabetes mellitus ([place]) 06/30/2013  . Atypical chest pain 12/09/2011  . Hypertension 12/09/2011  . Episodic mood disorder ([place]) 05/13/2011    Past Surgical History:  Procedure Laterality Date  . BOWEL DECOMPRESSION N/A 01/19/2020   Procedure: BOWEL DECOMPRESSION;  Surgeon: Otis Brace, MD;  Location: [facility];  Service: Gastroenterology;  Laterality: N/A;  . COLONOSCOPY WITH PROPOFOL N/A 01/19/2020   Procedure: COLONOSCOPY WITH PROPOFOL;  Surgeon: Otis Brace, MD;  Location: [facility];  Service: Gastroenterology;  Laterality: N/A;  . KNEE SURGERY     b/l knees; pending total knee        Family History  Problem Relation Age of Onset  . Diabetes Other   . Cancer Mother  uterus; deceased   . Heart attack Mother 42  . Hypertension Other   . Heart attack Father 43  . Stroke Sister        12/2012 age 6 y.o    Social History   Tobacco Use  . Smoking status: Former Research scientist (life sciences)  . Smokeless tobacco: Never Used  Vaping Use  . Vaping Use: Never used   Substance Use Topics  . Alcohol use: No  . Drug use: Yes    Types: Cocaine    Home Medications Prior to Admission medications   Medication Sig Start Date End Date Taking? Authorizing Provider  doxycycline (VIBRAMYCIN) 100 MG capsule Take 1 capsule (100 mg total) by mouth 2 (two) times daily. 04/15/20  Yes Quintella Reichert, MD  amLODipine (NORVASC) 10 MG tablet Take 1 tablet (10 mg total) by mouth daily. 03/30/20 04/29/20  [name], NP  atorvastatin (LIPITOR) 20 MG tablet Take 1 tablet (20 mg total) by mouth daily. 03/30/20   [name], NP  cephALEXin (KEFLEX) 500 MG capsule Take 1 capsule (500 mg total) by mouth 3 (three) times daily. 03/08/20   Drenda Freeze, MD  insulin aspart (NOVOLOG) 100 UNIT/ML injection Inject 12 Units into the skin 3 (three) times daily before meals. For blood sugars 0-150 give 0 units of insulin, 151-200 give 2 units of insulin, 201-250 give 4 units, 251-300 give 6 units, 301-350 give 8 units, 351-400 give 10 units,> 400 give 12 units and call M.D. 03/30/20   [name], NP  insulin glargine (LANTUS SOLOSTAR) 100 UNIT/ML Solostar Pen Inject 20 Units into the skin at bedtime. 03/30/20   [name], NP  lisinopril-hydrochlorothiazide (ZESTORETIC) 20-25 MG tablet Take 1 tablet by mouth daily. 03/30/20   [name], NP  metFORMIN (GLUCOPHAGE) 1000 MG tablet Take 1 tablet (1,000 mg total) by mouth 2 (two) times daily with a meal. 03/30/20   [name], NP  metroNIDAZOLE (FLAGYL) 500 MG tablet Take 1 tablet (500 mg total) by mouth 2 (two) times daily. One po bid x 7 days 03/08/20   Drenda Freeze, MD  nystatin (MYCOSTATIN/NYSTOP) powder Apply 1 application topically 3 (three) times daily. Apply to groin area 03/30/20   [name], NP  polyethylene glycol (MIRALAX / GLYCOLAX) 17 g packet Take 17 g by mouth daily as needed. 03/30/20   [name], NP  tamsulosin (FLOMAX) 0.4 MG CAPS capsule Take 1 capsule (0.4 mg  total) by mouth daily. 03/30/20   [name], NP    Allergies    Other  Review of Systems   Review of Systems  All other systems reviewed and are negative.   Physical Exam Updated Vital Signs BP (!) 152/95   Pulse 91   Temp 97.9 F (36.6 C) (Oral)   Resp 18   SpO2 100%   Physical Exam Vitals and nursing note reviewed.  Constitutional:      Appearance: He is well-developed and well-nourished.  HENT:     Head: Normocephalic and atraumatic.  Cardiovascular:     Rate and Rhythm: Normal rate and regular rhythm.     Heart sounds: No murmur heard.   Pulmonary:     Effort: Pulmonary effort is normal. No respiratory distress.     Breath sounds: Normal breath sounds.  Abdominal:     Palpations: Abdomen is soft.     Tenderness: There is no abdominal tenderness. There is no guarding or rebound.  Musculoskeletal:  General: No edema.     Comments: 2+ DP pulses bilaterally. 2+ edema to bilateral lower extremities. No point tenderness to palpation throughout the foot. There is a 2 cm laceration on the plantar surface of the right foot at the base of the fourth digit. The wound is gape on palpation  Skin:    General: Skin is warm and dry.  Neurological:     Mental Status: He is alert and oriented to person, place, and time.  Psychiatric:        Mood and Affect: Mood and affect normal.        Behavior: Behavior normal.     ED Results / Procedures / Treatments   Labs (all labs ordered are listed, but only abnormal results are displayed) Labs Reviewed - No data to display  EKG None  Radiology DG Foot Complete Right  Result Date: 04/15/2020 CLINICAL DATA:  Foot injury, laceration of the plantar EXAM: RIGHT FOOT COMPLETE - 3+ VIEW COMPARISON:  None. FINDINGS: Reported site of laceration along the plantar tissues is poorly visualized on radiography. No soft tissue gas or foreign body is seen. No associated osseous defect is evident. Subacute appearing deformity  involving the proximal metadiaphysis of the fifth proximal phalanx. Correlate with point tenderness to exclude more acute injury. No other discernible fracture or malalignment within limitations of this nonweightbearing exam. 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 No conspicuous osseous lesions. Degenerative changes throughout the foot and imaged ankle. Mild edematous changes of the lower extremity with extensive vascular calcifications. IMPRESSION: 1. Reported site of laceration along the plantar tissues is poorly visualized on radiography. No soft tissue gas or foreign body. 2. Subacute appearing fracture deformity involving the proximal metadiaphysis of the fifth proximal phalanx. Correlate with point tenderness to exclude more acute injury. 3. Background of diffuse edematous changes of the lower extremity with extensive vascular calcifications. Electronically Signed   By: Lovena Le M.D.   On: 04/15/2020 20:30    Procedures .Marland KitchenLaceration Repair  Date/Time: 04/15/2020 10:31 PM Performed by: Quintella Reichert, MD Authorized by: Quintella Reichert, MD   Consent:    Consent obtained:  Verbal   Consent given by:  Patient   Risks discussed:  Infection and pain   Alternatives discussed:  No treatment and observation Universal protocol:    Patient identity confirmed:  Verbally with patient Anesthesia:    Anesthesia method:  Topical application and local infiltration   Topical anesthetic:  LET   Local anesthetic:  Lidocaine 2% w/o epi Laceration details:    Location:  Toe   Toe location:  R fourth toe   Length (cm):  2 Pre-procedure details:    Preparation:  Patient was prepped and draped in usual sterile fashion Exploration:    Hemostasis achieved with:  Direct pressure   Imaging outcome: foreign body not noted   Treatment:    Area cleansed with:  Chlorhexidine   Amount of cleaning:  Standard   Irrigation solution:  Sterile water   Debridement:  None Skin repair:     Repair method:  Sutures   Suture size:  4-0   Suture material:  Prolene   Suture technique:  Simple interrupted   Number of sutures:  2 Approximation:    Approximation:  Close Repair type:    Repair type:  Simple Post-procedure details:    Dressing:  Antibiotic ointment and non-adherent dressing   Procedure completion:  Tolerated     Medications Ordered in ED Medications  doxycycline (  VIBRA-TABS) tablet 100 mg (has no administration in time range)  lidocaine-EPINEPHrine-tetracaine (LET) topical gel (3 mLs Topical Given 04/15/20 2033)  lidocaine (XYLOCAINE) 2 % (with pres) injection 100 mg (100 mg Infiltration Given 04/15/20 2135)    ED Course  I have reviewed the triage vital signs and the nursing notes.  Pertinent labs & imaging results that were available during my care of the patient were reviewed by me and considered in my medical decision making (see chart for details).    MDM Rules/Calculators/A&P                         Patient with history of diabetes here for evaluation of foot laceration that he sustained striking part of a bed. Last tetanus was in the last year. He has a 2 cm laceration on the plantar surface at the base of the right fourth toe. The wound required sutures to re-approximate the skin. He is at risk of infection due to his history of diabetes, will start prophylactic antibiotics. Discussed local wound care, outpatient follow-up and return precautions. He has no point tenderness to palpation throughout the foot, acute fracture is felt to not be likely. Final Clinical Impression(s) / ED Diagnoses Final diagnoses:  Laceration of right foot, initial encounter    Rx / DC Orders ED Discharge Orders         Ordered    doxycycline (VIBRAMYCIN) 100 MG capsule  2 times daily        04/15/20 2230           Quintella Reichert, MD 04/15/20 2237

## 2020-04-15 NOTE — Progress Notes (Signed)
Orthopedic Tech Progress Note Patient Details:  [name] 25-Sep-1960 [id number]  Ortho Devices Type of Ortho Device: Postop shoe/boot Ortho Device/Splint Location: right Ortho Device/Splint Interventions: Application   Post Interventions Patient Tolerated: Well Instructions Provided: Care of device   [name] 04/15/2020, 10:53 PM

## 2020-04-15 NOTE — ED Notes (Signed)
Care assumed. Report received from Ellen, RN 

## 2020-04-16 NOTE — ED Notes (Signed)
Pt stated he wants to go back to his rehab facility. This RN explained to pt that he reported feeling unsafe and not taken care of at facility. Pt denied this and stated he wanted to go back to the facility as soon as possible. Provider notified

## 2020-04-16 NOTE — ED Notes (Signed)
Report to Suanne Marker, Therapist, sports at [facility]. Pt will be returning to facility. [facility] will be called for transport

## 2020-04-25 ENCOUNTER — Emergency Department (HOSPITAL_COMMUNITY)
Admission: EM | Admit: 2020-04-25 | Discharge: 2020-04-25 | Disposition: A | Discharge status: Home / Self Care | Payer: Medicare HMO | Attending: Emergency Medicine | Admitting: Emergency Medicine

## 2020-04-25 ENCOUNTER — Other Ambulatory Visit: Payer: Self-pay

## 2020-04-25 DIAGNOSIS — E1122 Type 2 diabetes mellitus with diabetic chronic kidney disease: Secondary | ICD-10-CM | POA: Diagnosis not present

## 2020-04-25 DIAGNOSIS — Z794 Long term (current) use of insulin: Secondary | ICD-10-CM | POA: Diagnosis not present

## 2020-04-25 DIAGNOSIS — Z87891 Personal history of nicotine dependence: Secondary | ICD-10-CM | POA: Insufficient documentation

## 2020-04-25 DIAGNOSIS — E1140 Type 2 diabetes mellitus with diabetic neuropathy, unspecified: Secondary | ICD-10-CM | POA: Insufficient documentation

## 2020-04-25 DIAGNOSIS — Z4802 Encounter for removal of sutures: Secondary | ICD-10-CM | POA: Insufficient documentation

## 2020-04-25 DIAGNOSIS — I10 Essential (primary) hypertension: Secondary | ICD-10-CM

## 2020-04-25 DIAGNOSIS — I5032 Chronic diastolic (congestive) heart failure: Secondary | ICD-10-CM | POA: Insufficient documentation

## 2020-04-25 DIAGNOSIS — N184 Chronic kidney disease, stage 4 (severe): Secondary | ICD-10-CM | POA: Insufficient documentation

## 2020-04-25 DIAGNOSIS — Z79899 Other long term (current) drug therapy: Secondary | ICD-10-CM | POA: Insufficient documentation

## 2020-04-25 DIAGNOSIS — I130 Hypertensive heart and chronic kidney disease with heart failure and stage 1 through stage 4 chronic kidney disease, or unspecified chronic kidney disease: Secondary | ICD-10-CM | POA: Insufficient documentation

## 2020-04-25 DIAGNOSIS — S91114D Laceration without foreign body of right lesser toe(s) without damage to nail, subsequent encounter: Secondary | ICD-10-CM | POA: Insufficient documentation

## 2020-04-25 DIAGNOSIS — X58XXXD Exposure to other specified factors, subsequent encounter: Secondary | ICD-10-CM | POA: Insufficient documentation

## 2020-04-25 DIAGNOSIS — I2510 Atherosclerotic heart disease of native coronary artery without angina pectoris: Secondary | ICD-10-CM | POA: Diagnosis not present

## 2020-04-25 DIAGNOSIS — Z7984 Long term (current) use of oral hypoglycemic drugs: Secondary | ICD-10-CM | POA: Insufficient documentation

## 2020-04-25 MED ORDER — HYDROCHLOROTHIAZIDE 12.5 MG PO CAPS
25.0000 mg | ORAL_CAPSULE | Freq: Once | ORAL | Status: AC
  Administered 2020-04-25: 25 mg via ORAL
  Filled 2020-04-25: qty 2

## 2020-04-25 MED ORDER — LISINOPRIL 20 MG PO TABS
20.0000 mg | ORAL_TABLET | Freq: Once | ORAL | Status: AC
  Administered 2020-04-25: 20 mg via ORAL
  Filled 2020-04-25: qty 1

## 2020-04-25 MED ORDER — AMLODIPINE BESYLATE 5 MG PO TABS
10.0000 mg | ORAL_TABLET | Freq: Once | ORAL | Status: AC
  Administered 2020-04-25: 10 mg via ORAL
  Filled 2020-04-25: qty 2

## 2020-04-25 NOTE — ED Notes (Signed)
Attempted to call report back to [facility]- No answer. Facility staff to come transport pt back to facility

## 2020-04-25 NOTE — ED Notes (Signed)
Wound to right 4th toe cleaned and dressed with telfa, gauze and coban. Pt tolerated well. Wound care education provided. Pt verbalizes understanding

## 2020-04-25 NOTE — Discharge Instructions (Addendum)
Please continue to clean your wound daily and apply a dressing.  Watch for signs of infection including redness, swelling, fevers, worsening pain, discharge etc.  Please make sure you take your blood pressure medicines.  Follow-up with your primary care doctor.  Return to the ER for any new or worsening symptoms.

## 2020-04-25 NOTE — ED Provider Notes (Signed)
[facility] DEPT Provider Note   CSN: [id number] Arrival date & time: 04/25/20  6294     History Chief Complaint  Patient presents with  . Suture / Staple Removal    [name] is a 60 y.o. male.  HPI 60 year old male with a history of CHF, depression, DM type II, hypertension, bipolar affective disorder presents to the ER for suture removal.  Patient was seen here on 04/15/2020 with a laceration to the base of the plantar surface of the right fourth toe.  He had stitches placed, started on antibiotics given his history of diabetes.  He has no complaints at this time.  No fevers or chills.  Reports compliance with his antibiotics as they were given to him at his facility.    Past Medical History:  Diagnosis Date  . Arthritis   . Bipolar affective ([place])   . CHF (congestive heart failure) ([place])   . Depression   . Diabetes mellitus    uncontrolled   . Hypertension     Patient Active Problem List   Diagnosis Date Noted  . Chronic kidney disease, stage 4 (severe) ([place]) 03/30/2020  . Morbid (severe) obesity due to excess calories ([place]) 03/30/2020  . Malnutrition of moderate degree 01/25/2020  . Palliative care by specialist   . Goals of care, counseling/discussion   . Nephrotic syndrome 01/06/2020  . Anasarca 01/06/2020  . Hypokalemia due to loss of potassium 01/04/2020  . Generalized body aches 01/01/2020  . Non-traumatic rhabdomyolysis 01/01/2020  . Grade I diastolic dysfunction 76/54/6503  . Anemia 01/01/2020  . Hypertensive urgency 12/31/2019  . Hypokalemia 12/31/2019  . Elevated CK   . Chronic diastolic CHF (congestive heart failure) ([place]) 12/31/2017  . Poorly controlled diabetes mellitus ([place])   . Pure hypercholesterolemia   . Hyperglycemia 09/28/2017  . Nonobstructive atherosclerosis of coronary artery 09/28/2017  . MDD (major depressive disorder), recurrent episode, severe ([place]) 07/30/2017  . Osteoarthritis 04/12/2015  . Severe  recurrent major depression with psychotic features ([place]) 01/04/2015  . Substance-related disorder ([place]) 10/30/2014  . Patient's noncompliance with other medical treatment and regimen 06/07/2014  . Cocaine dependence, uncomplicated ([place]) 54/65/6812  . Chronic pain 05/31/2014  . Cocaine abuse ([place]) 05/31/2014  . History of noncompliance with medical treatment 05/31/2014  . Malingering 05/31/2014  . Leukopenia 05/31/2014  . Diabetic neuropathy associated with diabetes mellitus due to underlying condition ([place]) 05/31/2014  . Diabetic neuropathy ([place]) 05/31/2014  . Congestive heart failure with left ventricular systolic dysfunction ([place]) 02/22/2014  . Depression, major, recurrent, severe with psychosis ([place]) 08/29/2013  . Suicidal ideations 08/28/2013  . Uncontrolled diabetes mellitus ([place]) 06/30/2013  . Atypical chest pain 12/09/2011  . Hypertension 12/09/2011  . Episodic mood disorder ([place]) 05/13/2011    Past Surgical History:  Procedure Laterality Date  . BOWEL DECOMPRESSION N/A 01/19/2020   Procedure: BOWEL DECOMPRESSION;  Surgeon: Otis Brace, MD;  Location: [facility];  Service: Gastroenterology;  Laterality: N/A;  . COLONOSCOPY WITH PROPOFOL N/A 01/19/2020   Procedure: COLONOSCOPY WITH PROPOFOL;  Surgeon: Otis Brace, MD;  Location: [facility];  Service: Gastroenterology;  Laterality: N/A;  . KNEE SURGERY     b/l knees; pending total knee        Family History  Problem Relation Age of Onset  . Diabetes Other   . Cancer Mother        uterus; deceased   . Heart attack Mother 52  . Hypertension Other   . Heart attack Father 62  .  Stroke Sister        12/2012 age 12 y.o    Social History   Tobacco Use  . Smoking status: Former Research scientist (life sciences)  . Smokeless tobacco: Never Used  Vaping Use  . Vaping Use: Never used  Substance Use Topics  . Alcohol use: No  . Drug use: Yes    Types: Cocaine    Home Medications Prior to Admission medications   Medication Sig  Start Date End Date Taking? Authorizing Provider  amLODipine (NORVASC) 10 MG tablet Take 1 tablet (10 mg total) by mouth daily. 03/30/20 04/29/20  [name], NP  atorvastatin (LIPITOR) 20 MG tablet Take 1 tablet (20 mg total) by mouth daily. 03/30/20   [name], NP  cephALEXin (KEFLEX) 500 MG capsule Take 1 capsule (500 mg total) by mouth 3 (three) times daily. 03/08/20   Drenda Freeze, MD  doxycycline (VIBRAMYCIN) 100 MG capsule Take 1 capsule (100 mg total) by mouth 2 (two) times daily. 04/15/20   Quintella Reichert, MD  insulin aspart (NOVOLOG) 100 UNIT/ML injection Inject 12 Units into the skin 3 (three) times daily before meals. For blood sugars 0-150 give 0 units of insulin, 151-200 give 2 units of insulin, 201-250 give 4 units, 251-300 give 6 units, 301-350 give 8 units, 351-400 give 10 units,> 400 give 12 units and call M.D. 03/30/20   [name], NP  insulin glargine (LANTUS SOLOSTAR) 100 UNIT/ML Solostar Pen Inject 20 Units into the skin at bedtime. 03/30/20   [name], NP  lisinopril-hydrochlorothiazide (ZESTORETIC) 20-25 MG tablet Take 1 tablet by mouth daily. 03/30/20   [name], NP  metFORMIN (GLUCOPHAGE) 1000 MG tablet Take 1 tablet (1,000 mg total) by mouth 2 (two) times daily with a meal. 03/30/20   [name], NP  metroNIDAZOLE (FLAGYL) 500 MG tablet Take 1 tablet (500 mg total) by mouth 2 (two) times daily. One po bid x 7 days 03/08/20   Drenda Freeze, MD  nystatin (MYCOSTATIN/NYSTOP) powder Apply 1 application topically 3 (three) times daily. Apply to groin area 03/30/20   [name], NP  polyethylene glycol (MIRALAX / GLYCOLAX) 17 g packet Take 17 g by mouth daily as needed. 03/30/20   [name], NP  tamsulosin (FLOMAX) 0.4 MG CAPS capsule Take 1 capsule (0.4 mg total) by mouth daily. 03/30/20   [name], NP    Allergies    Other  Review of Systems   Review of Systems  Musculoskeletal: Negative for  arthralgias, back pain, gait problem and joint swelling.    Physical Exam Updated Vital Signs BP (!) 196/114 (BP Location: Right Arm)   Pulse 99   Temp (!) 97.4 F (36.3 C) (Oral)   Resp 19   SpO2 98%   Physical Exam Vitals reviewed.  Constitutional:      Appearance: Normal appearance. He is obese.  HENT:     Head: Normocephalic and atraumatic.  Eyes:     General:        Right eye: No discharge.        Left eye: No discharge.     Extraocular Movements: Extraocular movements intact.     Conjunctiva/sclera: Conjunctivae normal.  Musculoskeletal:        General: No swelling. Normal range of motion.     Comments: Plantar aspect of the fourth right toe with evidence of 2 sutures.  1 suture fortunately did not hold, there is still evidence of open skin with  trace bleeding, controlled with pressure.  No significant surrounding erythema, areas of necrosis, pus drainage, full flexion extension of right fourth toe  Neurological:     General: No focal deficit present.     Mental Status: He is alert and oriented to person, place, and time.  Psychiatric:        Mood and Affect: Mood normal.        Behavior: Behavior normal.     ED Results / Procedures / Treatments   Labs (all labs ordered are listed, but only abnormal results are displayed) Labs Reviewed - No data to display  EKG None  Radiology No results found.  Procedures .Suture Removal  Date/Time: 04/25/2020 9:46 AM Performed by: Garald Balding, PA-C Authorized by: Garald Balding, PA-C   Consent:    Consent obtained:  Verbal   Risks discussed:  Bleeding and pain   Alternatives discussed:  No treatment Universal protocol:    Patient identity confirmed:  Verbally with patient Location:    Location:  Lower extremity   Lower extremity location:  Toe   Toe location:  R fourth toe Procedure details:    Wound appearance:  No signs of infection and red   Number of sutures removed:  2 Post-procedure details:     Post-removal:  Dressing applied   Procedure completion:  Tolerated well, no immediate complications     Medications Ordered in ED Medications  amLODipine (NORVASC) tablet 10 mg (has no administration in time range)  lisinopril (ZESTRIL) tablet 20 mg (has no administration in time range)  hydrochlorothiazide (MICROZIDE) capsule 25 mg (has no administration in time range)    ED Course  I have reviewed the triage vital signs and the nursing notes.  Pertinent labs & imaging results that were available during my care of the patient were reviewed by me and considered in my medical decision making (see chart for details).    MDM Rules/Calculators/A&P                          Pt to ER for staple/suture removal and wound check as above.  Unfortunately sutures did not hold well given the high tension area at the base of the toe.  Suture removal tolerated well.  Patient is afebrile here in the ER, hypertensive, however no chest pain, shortness of breath, no signs of hypertensive urgency/emergency.  Patient reports he did not take his morning blood pressure medicines.  Dose given here.  No signs of infection. Scar minimization & return precautions given at dc.   Final Clinical Impression(s) / ED Diagnoses Final diagnoses:  Visit for suture removal  Hypertension, unspecified type    Rx / DC Orders ED Discharge Orders    None       Lyndel Safe 04/25/20 0956    Lacretia Leigh, MD 04/26/20 [id number]

## 2020-04-25 NOTE — ED Notes (Signed)
Awaiting registration prior to DC

## 2020-04-25 NOTE — ED Triage Notes (Addendum)
Pt arrives POV from [facility],  Pt reports that he had sutures placed in right foot 10 days ago. Pt here for suture removal.Pt denies any redness, swelling, or drainage. Pt noted to be hypertensive upon arrival. Pt states that he takes medication but has not had it today

## 2020-05-11 ENCOUNTER — Encounter (HOSPITAL_COMMUNITY): Payer: Self-pay | Admitting: Emergency Medicine

## 2020-05-11 ENCOUNTER — Emergency Department (HOSPITAL_COMMUNITY)
Admission: EM | Admit: 2020-05-11 | Discharge: 2020-05-11 | Disposition: A | Discharge status: Home / Self Care | Payer: Medicare HMO | Attending: Emergency Medicine | Admitting: Emergency Medicine

## 2020-05-11 DIAGNOSIS — I130 Hypertensive heart and chronic kidney disease with heart failure and stage 1 through stage 4 chronic kidney disease, or unspecified chronic kidney disease: Secondary | ICD-10-CM | POA: Diagnosis not present

## 2020-05-11 DIAGNOSIS — Z87891 Personal history of nicotine dependence: Secondary | ICD-10-CM | POA: Insufficient documentation

## 2020-05-11 DIAGNOSIS — Z7984 Long term (current) use of oral hypoglycemic drugs: Secondary | ICD-10-CM | POA: Diagnosis not present

## 2020-05-11 DIAGNOSIS — R202 Paresthesia of skin: Secondary | ICD-10-CM | POA: Diagnosis not present

## 2020-05-11 DIAGNOSIS — E1122 Type 2 diabetes mellitus with diabetic chronic kidney disease: Secondary | ICD-10-CM | POA: Diagnosis not present

## 2020-05-11 DIAGNOSIS — R5381 Other malaise: Secondary | ICD-10-CM | POA: Diagnosis not present

## 2020-05-11 DIAGNOSIS — I5032 Chronic diastolic (congestive) heart failure: Secondary | ICD-10-CM | POA: Diagnosis not present

## 2020-05-11 DIAGNOSIS — N184 Chronic kidney disease, stage 4 (severe): Secondary | ICD-10-CM | POA: Diagnosis not present

## 2020-05-11 DIAGNOSIS — Z79899 Other long term (current) drug therapy: Secondary | ICD-10-CM | POA: Diagnosis not present

## 2020-05-11 DIAGNOSIS — I10 Essential (primary) hypertension: Secondary | ICD-10-CM | POA: Diagnosis not present

## 2020-05-11 DIAGNOSIS — Z794 Long term (current) use of insulin: Secondary | ICD-10-CM | POA: Diagnosis not present

## 2020-05-11 LAB — BASIC METABOLIC PANEL
Anion gap: 6 (ref 5–15)
BUN: 21 mg/dL — ABNORMAL HIGH (ref 6–20)
CO2: 22 mmol/L (ref 22–32)
Calcium: 8.5 mg/dL — ABNORMAL LOW (ref 8.9–10.3)
Chloride: 106 mmol/L (ref 98–111)
Creatinine, Ser: 1.31 mg/dL — ABNORMAL HIGH (ref 0.61–1.24)
GFR, Estimated: >60 mL/min (ref >60)
Glucose, Bld: 244 mg/dL — ABNORMAL HIGH (ref 70–99)
Potassium: 4.3 mmol/L (ref 3.5–5.1)
Sodium: 134 mmol/L — ABNORMAL LOW (ref 135–145)

## 2020-05-11 LAB — CBC WITH DIFFERENTIAL/PLATELET
Abs Immature Granulocytes: 0.02 10*3/uL (ref 0.00–0.07)
Basophils Absolute: 0 10*3/uL (ref 0.0–0.1)
Basophils Relative: 1 %
Eosinophils Absolute: 0.2 10*3/uL (ref 0.0–0.5)
Eosinophils Relative: 4 %
HCT: 31.4 % — ABNORMAL LOW (ref 39.0–52.0)
Hemoglobin: 9.7 g/dL — ABNORMAL LOW (ref 13.0–17.0)
Immature Granulocytes: 1 %
Lymphocytes Relative: 36 %
Lymphs Abs: 1.2 10*3/uL (ref 0.7–4.0)
MCH: 25.5 pg — ABNORMAL LOW (ref 26.0–34.0)
MCHC: 30.9 g/dL (ref 30.0–36.0)
MCV: 82.4 fL (ref 80.0–100.0)
Monocytes Absolute: 0.5 10*3/uL (ref 0.1–1.0)
Monocytes Relative: 14 %
Neutro Abs: 1.5 10*3/uL — ABNORMAL LOW (ref 1.7–7.7)
Neutrophils Relative %: 44 %
Platelets: 273 10*3/uL (ref 150–400)
RBC: 3.81 MIL/uL — ABNORMAL LOW (ref 4.22–5.81)
RDW: 14 % (ref 11.5–15.5)
WBC: 3.4 10*3/uL — ABNORMAL LOW (ref 4.0–10.5)
nRBC: 0 % (ref 0.0–0.2)

## 2020-05-11 LAB — CBG MONITORING, ED: Glucose-Capillary: 227 mg/dL — ABNORMAL HIGH (ref 70–99)

## 2020-05-11 MED ORDER — GABAPENTIN 100 MG PO CAPS: 100.0000 mg | ORAL_CAPSULE | Freq: Three times a day (TID) | ORAL | 0 refills | Status: DC

## 2020-05-11 NOTE — ED Notes (Signed)
Patient was upset because he stated "he has been waiting for ptar since 12:00 and he was ready to leave.  He refused to stay in room wanted to go in the lobby to wait on his brother that he called for a ride.

## 2020-05-11 NOTE — ED Notes (Signed)
[facility] dispatched- per PT[facility]- ETA 1.5 hours.

## 2020-05-11 NOTE — Discharge Instructions (Signed)
Please return for any problem.  °

## 2020-05-11 NOTE — ED Notes (Addendum)
Spoke to [facility], report given. Per facility they do not have transport today and need to call PT[facility].

## 2020-05-11 NOTE — ED Notes (Signed)
Patient reports he called his brother to pick him up. Patient yelling and refusing to stay in the room and insists on waiting in the lobby. Charge RN made aware.

## 2020-05-11 NOTE — ED Triage Notes (Signed)
Per [facility]-burning and tingling under skin since last night-CBG 297 has not taking any meds this am

## 2020-05-11 NOTE — ED Provider Notes (Signed)
[facility] DEPT Provider Note   CSN: [id number] Arrival date & time: 05/11/20  5638     History Chief Complaint  Patient presents with  . Tingling    [name] is a 60 y.o. male.  60 year old male with prior medical history as detailed below presents for evaluation.  Patient complains of "feeling tingly" along his back.  He describes a painful pins-and-needles sensation along his entire back.  He denies sensation changes to his extremities.  He denies paresthesias to his anterior thorax.  He denies weakness.  He denies visual change.  He denies other specific complaint including fever, headache, chest pain, shortness of breath, or other specific complaint.  He reports that this tingling sensation has been bothering for the last 3 to 4 days.  The history is provided by the patient and medical records.  Illness Location:  "tingling" Severity:  Mild Onset quality:  Gradual Duration:  3 days Timing:  Intermittent Progression:  Waxing and waning Chronicity:  New      Past Medical History:  Diagnosis Date  . Arthritis   . Bipolar affective ([place])   . CHF (congestive heart failure) ([place])   . Depression   . Diabetes mellitus    uncontrolled   . Hypertension     Patient Active Problem List   Diagnosis Date Noted  . Chronic kidney disease, stage 4 (severe) ([place]) 03/30/2020  . Morbid (severe) obesity due to excess calories ([place]) 03/30/2020  . Malnutrition of moderate degree 01/25/2020  . Palliative care by specialist   . Goals of care, counseling/discussion   . Nephrotic syndrome 01/06/2020  . Anasarca 01/06/2020  . Hypokalemia due to loss of potassium 01/04/2020  . Generalized body aches 01/01/2020  . Non-traumatic rhabdomyolysis 01/01/2020  . Grade I diastolic dysfunction 93/73/4287  . Anemia 01/01/2020  . Hypertensive urgency 12/31/2019  . Hypokalemia 12/31/2019  . Elevated CK   . Chronic diastolic CHF (congestive heart failure)  ([place]) 12/31/2017  . Poorly controlled diabetes mellitus ([place])   . Pure hypercholesterolemia   . Hyperglycemia 09/28/2017  . Nonobstructive atherosclerosis of coronary artery 09/28/2017  . MDD (major depressive disorder), recurrent episode, severe ([place]) 07/30/2017  . Osteoarthritis 04/12/2015  . Severe recurrent major depression with psychotic features ([place]) 01/04/2015  . Substance-related disorder ([place]) 10/30/2014  . Patient's noncompliance with other medical treatment and regimen 06/07/2014  . Cocaine dependence, uncomplicated ([place]) 68/12/5724  . Chronic pain 05/31/2014  . Cocaine abuse ([place]) 05/31/2014  . History of noncompliance with medical treatment 05/31/2014  . Malingering 05/31/2014  . Leukopenia 05/31/2014  . Diabetic neuropathy associated with diabetes mellitus due to underlying condition ([place]) 05/31/2014  . Diabetic neuropathy ([place]) 05/31/2014  . Congestive heart failure with left ventricular systolic dysfunction ([place]) 02/22/2014  . Depression, major, recurrent, severe with psychosis ([place]) 08/29/2013  . Suicidal ideations 08/28/2013  . Uncontrolled diabetes mellitus ([place]) 06/30/2013  . Atypical chest pain 12/09/2011  . Hypertension 12/09/2011  . Episodic mood disorder ([place]) 05/13/2011    Past Surgical History:  Procedure Laterality Date  . BOWEL DECOMPRESSION N/A 01/19/2020   Procedure: BOWEL DECOMPRESSION;  Surgeon: Otis Brace, MD;  Location: [facility];  Service: Gastroenterology;  Laterality: N/A;  . COLONOSCOPY WITH PROPOFOL N/A 01/19/2020   Procedure: COLONOSCOPY WITH PROPOFOL;  Surgeon: Otis Brace, MD;  Location: [facility];  Service: Gastroenterology;  Laterality: N/A;  . KNEE SURGERY     b/l knees; pending total knee        Family History  Problem Relation Age of Onset  . Diabetes Other   . Cancer Mother        uterus; deceased   . Heart attack Mother 39  . Hypertension Other   . Heart attack Father 35  . Stroke Sister         12/2012 age 40 y.o    Social History   Tobacco Use  . Smoking status: Former Research scientist (life sciences)  . Smokeless tobacco: Never Used  Vaping Use  . Vaping Use: Never used  Substance Use Topics  . Alcohol use: No  . Drug use: Yes    Types: Cocaine    Home Medications Prior to Admission medications   Medication Sig Start Date End Date Taking? Authorizing Provider  gabapentin (NEURONTIN) 100 MG capsule Take 1 capsule (100 mg total) by mouth 3 (three) times daily. 05/11/20  Yes Valarie Merino, MD  amLODipine (NORVASC) 10 MG tablet Take 1 tablet (10 mg total) by mouth daily. 03/30/20 04/29/20  [name], NP  atorvastatin (LIPITOR) 20 MG tablet Take 1 tablet (20 mg total) by mouth daily. 03/30/20   [name], NP  cephALEXin (KEFLEX) 500 MG capsule Take 1 capsule (500 mg total) by mouth 3 (three) times daily. 03/08/20   Drenda Freeze, MD  doxycycline (VIBRAMYCIN) 100 MG capsule Take 1 capsule (100 mg total) by mouth 2 (two) times daily. 04/15/20   Quintella Reichert, MD  insulin aspart (NOVOLOG) 100 UNIT/ML injection Inject 12 Units into the skin 3 (three) times daily before meals. For blood sugars 0-150 give 0 units of insulin, 151-200 give 2 units of insulin, 201-250 give 4 units, 251-300 give 6 units, 301-350 give 8 units, 351-400 give 10 units,> 400 give 12 units and call M.D. 03/30/20   [name], NP  insulin glargine (LANTUS SOLOSTAR) 100 UNIT/ML Solostar Pen Inject 20 Units into the skin at bedtime. 03/30/20   [name], NP  lisinopril-hydrochlorothiazide (ZESTORETIC) 20-25 MG tablet Take 1 tablet by mouth daily. 03/30/20   [name], NP  metFORMIN (GLUCOPHAGE) 1000 MG tablet Take 1 tablet (1,000 mg total) by mouth 2 (two) times daily with a meal. 03/30/20   [name], NP  metroNIDAZOLE (FLAGYL) 500 MG tablet Take 1 tablet (500 mg total) by mouth 2 (two) times daily. One po bid x 7 days 03/08/20   Drenda Freeze, MD  nystatin (MYCOSTATIN/NYSTOP) powder  Apply 1 application topically 3 (three) times daily. Apply to groin area 03/30/20   [name], NP  polyethylene glycol (MIRALAX / GLYCOLAX) 17 g packet Take 17 g by mouth daily as needed. 03/30/20   [name], NP  tamsulosin (FLOMAX) 0.4 MG CAPS capsule Take 1 capsule (0.4 mg total) by mouth daily. 03/30/20   [name], NP    Allergies    Other  Review of Systems   Review of Systems  All other systems reviewed and are negative.   Physical Exam Updated Vital Signs BP (!) 200/114 (BP Location: Left Arm)   Pulse 90   Temp 98.3 F (36.8 C)   Resp 18   SpO2 100%   Physical Exam Vitals and nursing note reviewed.  Constitutional:      General: He is not in acute distress.    Appearance: He is well-developed.  HENT:     Head: Normocephalic and atraumatic.  Eyes:     Conjunctiva/sclera: Conjunctivae normal.     Pupils: Pupils are equal, round, and reactive to  light.  Cardiovascular:     Rate and Rhythm: Normal rate and regular rhythm.     Heart sounds: Normal heart sounds.  Pulmonary:     Effort: Pulmonary effort is normal. No respiratory distress.     Breath sounds: Normal breath sounds.  Abdominal:     General: There is no distension.     Palpations: Abdomen is soft.     Tenderness: There is no abdominal tenderness.  Musculoskeletal:        General: No deformity. Normal range of motion.     Cervical back: Normal range of motion and neck supple.  Skin:    General: Skin is warm and dry.  Neurological:     Mental Status: He is alert and oriented to person, place, and time. Mental status is at baseline.     Cranial Nerves: No cranial nerve deficit.     Sensory: No sensory deficit.     Motor: No weakness.     Coordination: Coordination normal.     ED Results / Procedures / Treatments   Labs (all labs ordered are listed, but only abnormal results are displayed) Labs Reviewed  BASIC METABOLIC PANEL - Abnormal; Notable for the following components:       Result Value   Sodium 134 (*)    Glucose, Bld 244 (*)    BUN 21 (*)    Creatinine, Ser 1.31 (*)    Calcium 8.5 (*)    All other components within normal limits  CBC WITH DIFFERENTIAL/PLATELET - Abnormal; Notable for the following components:   WBC 3.4 (*)    RBC 3.81 (*)    Hemoglobin 9.7 (*)    HCT 31.4 (*)    MCH 25.5 (*)    Neutro Abs 1.5 (*)    All other components within normal limits  CBG MONITORING, ED - Abnormal; Notable for the following components:   Glucose-Capillary 227 (*)    All other components within normal limits    EKG None  Radiology No results found.  Procedures Procedures   Medications Ordered in ED Medications - No data to display  ED Course  I have reviewed the triage vital signs and the nursing notes.  Pertinent labs & imaging results that were available during my care of the patient were reviewed by me and considered in my medical decision making (see chart for details).    MDM Rules/Calculators/A&P                          MDM  Screen complete  [name] was evaluated in Emergency Department on 05/11/2020 for the symptoms described in the history of present illness. He was evaluated in the context of the global COVID-19 pandemic, which necessitated consideration that the patient might be at risk for infection with the SARS-CoV-2 virus that causes COVID-19. Institutional protocols and algorithms that pertain to the evaluation of patients at risk for COVID-19 are in a state of rapid change based on information released by regulatory bodies including the CDC and federal and state organizations. These policies and algorithms were followed during the patient's care in the ED.   Patient is presenting for evaluation of reported paresthesia.  Screening labs obtained are without significant abnormality.  Patient is agreeable to trial of gabapentin.  Patient symptoms are perhaps consistent with diabetic neuropathy.  Patient does have  established primary care whom he is going to follow-up with.   Final Clinical Impression(s) / ED Diagnoses  Final diagnoses:  Tingling    Rx / DC Orders ED Discharge Orders         Ordered    gabapentin (NEURONTIN) 100 MG capsule  3 times daily        05/11/20 1236           Valarie Merino, MD 05/11/20 1239

## 2020-05-11 NOTE — ED Notes (Signed)
Spoke with PT[facility] for update who state patient is 3rd in line and probably another 1.5 hours before [facility] will arrive.

## 2020-05-16 DIAGNOSIS — I10 Essential (primary) hypertension: Secondary | ICD-10-CM | POA: Diagnosis not present

## 2020-05-16 DIAGNOSIS — N189 Chronic kidney disease, unspecified: Secondary | ICD-10-CM | POA: Diagnosis not present

## 2020-05-16 DIAGNOSIS — L853 Xerosis cutis: Secondary | ICD-10-CM | POA: Diagnosis not present

## 2020-05-16 DIAGNOSIS — E1122 Type 2 diabetes mellitus with diabetic chronic kidney disease: Secondary | ICD-10-CM | POA: Diagnosis not present

## 2020-05-17 DIAGNOSIS — E782 Mixed hyperlipidemia: Secondary | ICD-10-CM | POA: Diagnosis not present

## 2020-05-17 DIAGNOSIS — I10 Essential (primary) hypertension: Secondary | ICD-10-CM | POA: Diagnosis not present

## 2020-05-17 DIAGNOSIS — E039 Hypothyroidism, unspecified: Secondary | ICD-10-CM | POA: Diagnosis not present

## 2020-05-17 DIAGNOSIS — E119 Type 2 diabetes mellitus without complications: Secondary | ICD-10-CM | POA: Diagnosis not present

## 2020-05-18 DIAGNOSIS — N189 Chronic kidney disease, unspecified: Secondary | ICD-10-CM | POA: Diagnosis not present

## 2020-05-18 DIAGNOSIS — I10 Essential (primary) hypertension: Secondary | ICD-10-CM | POA: Diagnosis not present

## 2020-05-18 DIAGNOSIS — E1122 Type 2 diabetes mellitus with diabetic chronic kidney disease: Secondary | ICD-10-CM | POA: Diagnosis not present

## 2020-05-22 DIAGNOSIS — E1122 Type 2 diabetes mellitus with diabetic chronic kidney disease: Secondary | ICD-10-CM | POA: Diagnosis not present

## 2020-05-22 DIAGNOSIS — I130 Hypertensive heart and chronic kidney disease with heart failure and stage 1 through stage 4 chronic kidney disease, or unspecified chronic kidney disease: Secondary | ICD-10-CM | POA: Diagnosis not present

## 2020-05-23 DIAGNOSIS — R69 Illness, unspecified: Secondary | ICD-10-CM | POA: Diagnosis not present

## 2020-05-30 ENCOUNTER — Telehealth: Payer: Self-pay

## 2020-05-30 NOTE — Telephone Encounter (Signed)
Copied from [facility] [phone]. Topic: General - Inquiry >> May 30, 2020  9:19 AM Greggory Keen D wrote: Reason for CRM: [name] with [name] called saying patient wants to start PT.  They are wanting to know if you will write orders for him or does he need to see you first.  CB#  [phone]

## 2020-06-01 DIAGNOSIS — I69351 Hemiplegia and hemiparesis following cerebral infarction affecting right dominant side: Secondary | ICD-10-CM | POA: Diagnosis not present

## 2020-06-01 DIAGNOSIS — G9589 Other specified diseases of spinal cord: Secondary | ICD-10-CM | POA: Diagnosis not present

## 2020-06-01 DIAGNOSIS — J45991 Cough variant asthma: Secondary | ICD-10-CM | POA: Diagnosis not present

## 2020-06-01 DIAGNOSIS — C496 Malignant neoplasm of connective and soft tissue of trunk, unspecified: Secondary | ICD-10-CM | POA: Diagnosis not present

## 2020-06-02 ENCOUNTER — Other Ambulatory Visit (INDEPENDENT_AMBULATORY_CARE_PROVIDER_SITE_OTHER): Payer: Self-pay | Admitting: Primary Care

## 2020-06-02 DIAGNOSIS — R5381 Other malaise: Secondary | ICD-10-CM

## 2020-06-08 DIAGNOSIS — R69 Illness, unspecified: Secondary | ICD-10-CM | POA: Diagnosis not present

## 2020-06-20 DIAGNOSIS — R600 Localized edema: Secondary | ICD-10-CM | POA: Diagnosis not present

## 2020-06-20 DIAGNOSIS — I10 Essential (primary) hypertension: Secondary | ICD-10-CM | POA: Diagnosis not present

## 2020-06-20 DIAGNOSIS — L853 Xerosis cutis: Secondary | ICD-10-CM | POA: Diagnosis not present

## 2020-06-20 DIAGNOSIS — E1122 Type 2 diabetes mellitus with diabetic chronic kidney disease: Secondary | ICD-10-CM | POA: Diagnosis not present

## 2020-06-22 DIAGNOSIS — R69 Illness, unspecified: Secondary | ICD-10-CM | POA: Diagnosis not present

## 2020-06-25 ENCOUNTER — Other Ambulatory Visit: Payer: Self-pay

## 2020-06-25 ENCOUNTER — Encounter (HOSPITAL_COMMUNITY): Payer: Self-pay | Admitting: Internal Medicine

## 2020-06-25 ENCOUNTER — Observation Stay (HOSPITAL_COMMUNITY)
Admission: EM | Admit: 2020-06-25 | Discharge: 2020-06-28 | Disposition: A | Discharge status: Home / Self Care | Payer: Medicare HMO | Attending: Internal Medicine | Admitting: Internal Medicine

## 2020-06-25 ENCOUNTER — Observation Stay (HOSPITAL_BASED_OUTPATIENT_CLINIC_OR_DEPARTMENT_OTHER): Payer: Medicare HMO

## 2020-06-25 ENCOUNTER — Emergency Department (HOSPITAL_COMMUNITY): Payer: Medicare HMO

## 2020-06-25 DIAGNOSIS — R0602 Shortness of breath: Secondary | ICD-10-CM | POA: Diagnosis not present

## 2020-06-25 DIAGNOSIS — I361 Nonrheumatic tricuspid (valve) insufficiency: Secondary | ICD-10-CM | POA: Diagnosis not present

## 2020-06-25 DIAGNOSIS — Z87891 Personal history of nicotine dependence: Secondary | ICD-10-CM | POA: Insufficient documentation

## 2020-06-25 DIAGNOSIS — I340 Nonrheumatic mitral (valve) insufficiency: Secondary | ICD-10-CM

## 2020-06-25 DIAGNOSIS — N182 Chronic kidney disease, stage 2 (mild): Secondary | ICD-10-CM | POA: Diagnosis not present

## 2020-06-25 DIAGNOSIS — N189 Chronic kidney disease, unspecified: Secondary | ICD-10-CM | POA: Diagnosis present

## 2020-06-25 DIAGNOSIS — I313 Pericardial effusion (noninflammatory): Secondary | ICD-10-CM | POA: Diagnosis not present

## 2020-06-25 DIAGNOSIS — D72819 Decreased white blood cell count, unspecified: Secondary | ICD-10-CM | POA: Diagnosis present

## 2020-06-25 DIAGNOSIS — G4489 Other headache syndrome: Secondary | ICD-10-CM | POA: Diagnosis not present

## 2020-06-25 DIAGNOSIS — Z7984 Long term (current) use of oral hypoglycemic drugs: Secondary | ICD-10-CM | POA: Insufficient documentation

## 2020-06-25 DIAGNOSIS — I10 Essential (primary) hypertension: Secondary | ICD-10-CM | POA: Diagnosis not present

## 2020-06-25 DIAGNOSIS — D469 Myelodysplastic syndrome, unspecified: Secondary | ICD-10-CM | POA: Diagnosis present

## 2020-06-25 DIAGNOSIS — I2721 Secondary pulmonary arterial hypertension: Secondary | ICD-10-CM | POA: Diagnosis not present

## 2020-06-25 DIAGNOSIS — E440 Moderate protein-calorie malnutrition: Secondary | ICD-10-CM | POA: Diagnosis not present

## 2020-06-25 DIAGNOSIS — J90 Pleural effusion, not elsewhere classified: Secondary | ICD-10-CM | POA: Diagnosis not present

## 2020-06-25 DIAGNOSIS — Z794 Long term (current) use of insulin: Secondary | ICD-10-CM | POA: Diagnosis not present

## 2020-06-25 DIAGNOSIS — Z79899 Other long term (current) drug therapy: Secondary | ICD-10-CM | POA: Diagnosis not present

## 2020-06-25 DIAGNOSIS — D649 Anemia, unspecified: Secondary | ICD-10-CM | POA: Diagnosis present

## 2020-06-25 DIAGNOSIS — E041 Nontoxic single thyroid nodule: Secondary | ICD-10-CM | POA: Diagnosis not present

## 2020-06-25 DIAGNOSIS — J811 Chronic pulmonary edema: Secondary | ICD-10-CM | POA: Diagnosis not present

## 2020-06-25 DIAGNOSIS — N184 Chronic kidney disease, stage 4 (severe): Secondary | ICD-10-CM | POA: Insufficient documentation

## 2020-06-25 DIAGNOSIS — D61818 Other pancytopenia: Secondary | ICD-10-CM | POA: Diagnosis present

## 2020-06-25 DIAGNOSIS — R0789 Other chest pain: Secondary | ICD-10-CM | POA: Diagnosis not present

## 2020-06-25 DIAGNOSIS — I350 Nonrheumatic aortic (valve) stenosis: Secondary | ICD-10-CM | POA: Diagnosis not present

## 2020-06-25 DIAGNOSIS — R519 Headache, unspecified: Secondary | ICD-10-CM | POA: Insufficient documentation

## 2020-06-25 DIAGNOSIS — I6930 Unspecified sequelae of cerebral infarction: Secondary | ICD-10-CM | POA: Diagnosis not present

## 2020-06-25 DIAGNOSIS — R079 Chest pain, unspecified: Secondary | ICD-10-CM

## 2020-06-25 DIAGNOSIS — M549 Dorsalgia, unspecified: Secondary | ICD-10-CM | POA: Diagnosis present

## 2020-06-25 DIAGNOSIS — E042 Nontoxic multinodular goiter: Secondary | ICD-10-CM | POA: Diagnosis not present

## 2020-06-25 DIAGNOSIS — I5033 Acute on chronic diastolic (congestive) heart failure: Secondary | ICD-10-CM

## 2020-06-25 DIAGNOSIS — I130 Hypertensive heart and chronic kidney disease with heart failure and stage 1 through stage 4 chronic kidney disease, or unspecified chronic kidney disease: Secondary | ICD-10-CM | POA: Insufficient documentation

## 2020-06-25 DIAGNOSIS — R778 Other specified abnormalities of plasma proteins: Secondary | ICD-10-CM | POA: Diagnosis not present

## 2020-06-25 DIAGNOSIS — E1140 Type 2 diabetes mellitus with diabetic neuropathy, unspecified: Secondary | ICD-10-CM | POA: Insufficient documentation

## 2020-06-25 DIAGNOSIS — I5032 Chronic diastolic (congestive) heart failure: Secondary | ICD-10-CM | POA: Insufficient documentation

## 2020-06-25 DIAGNOSIS — Z20822 Contact with and (suspected) exposure to covid-19: Secondary | ICD-10-CM | POA: Insufficient documentation

## 2020-06-25 DIAGNOSIS — D509 Iron deficiency anemia, unspecified: Secondary | ICD-10-CM | POA: Diagnosis not present

## 2020-06-25 DIAGNOSIS — I351 Nonrheumatic aortic (valve) insufficiency: Secondary | ICD-10-CM

## 2020-06-25 DIAGNOSIS — E1165 Type 2 diabetes mellitus with hyperglycemia: Secondary | ICD-10-CM | POA: Diagnosis not present

## 2020-06-25 DIAGNOSIS — K8020 Calculus of gallbladder without cholecystitis without obstruction: Secondary | ICD-10-CM | POA: Diagnosis not present

## 2020-06-25 DIAGNOSIS — IMO0002 Reserved for concepts with insufficient information to code with codable children: Secondary | ICD-10-CM | POA: Diagnosis not present

## 2020-06-25 DIAGNOSIS — I517 Cardiomegaly: Secondary | ICD-10-CM | POA: Diagnosis not present

## 2020-06-25 DIAGNOSIS — R109 Unspecified abdominal pain: Secondary | ICD-10-CM

## 2020-06-25 DIAGNOSIS — J9811 Atelectasis: Secondary | ICD-10-CM | POA: Diagnosis not present

## 2020-06-25 LAB — COMPREHENSIVE METABOLIC PANEL
ALT: 13 U/L (ref 0–44)
AST: 20 U/L (ref 15–41)
Albumin: 1.9 g/dL — ABNORMAL LOW (ref 3.5–5.0)
Alkaline Phosphatase: 102 U/L (ref 38–126)
Anion gap: 8 (ref 5–15)
BUN: 11 mg/dL (ref 6–20)
CO2: 22 mmol/L (ref 22–32)
Calcium: 8.3 mg/dL — ABNORMAL LOW (ref 8.9–10.3)
Chloride: 103 mmol/L (ref 98–111)
Creatinine, Ser: 1.36 mg/dL — ABNORMAL HIGH (ref 0.61–1.24)
GFR, Estimated: >60 mL/min (ref >60)
Glucose, Bld: 215 mg/dL — ABNORMAL HIGH (ref 70–99)
Potassium: 4 mmol/L (ref 3.5–5.1)
Sodium: 133 mmol/L — ABNORMAL LOW (ref 135–145)
Total Bilirubin: 0.6 mg/dL (ref 0.3–1.2)
Total Protein: 6 g/dL — ABNORMAL LOW (ref 6.5–8.1)

## 2020-06-25 LAB — RAPID URINE DRUG SCREEN, HOSP PERFORMED
Amphetamines: NOT DETECTED
Barbiturates: NOT DETECTED
Benzodiazepines: NOT DETECTED
Cocaine: NOT DETECTED
Opiates: POSITIVE — AB
Tetrahydrocannabinol: NOT DETECTED

## 2020-06-25 LAB — LIPID PANEL
Cholesterol: 305 mg/dL — ABNORMAL HIGH (ref 0–200)
HDL: 63 mg/dL (ref >40)
LDL Cholesterol: 224 mg/dL — ABNORMAL HIGH (ref 0–99)
Total CHOL/HDL Ratio: 4.8 RATIO
Triglycerides: 90 mg/dL (ref <150)
VLDL: 18 mg/dL (ref 0–40)

## 2020-06-25 LAB — CBC WITH DIFFERENTIAL/PLATELET
Abs Immature Granulocytes: 0.02 10*3/uL (ref 0.00–0.07)
Basophils Absolute: 0 10*3/uL (ref 0.0–0.1)
Basophils Relative: 1 %
Eosinophils Absolute: 0.1 10*3/uL (ref 0.0–0.5)
Eosinophils Relative: 4 %
HCT: 35 % — ABNORMAL LOW (ref 39.0–52.0)
Hemoglobin: 11 g/dL — ABNORMAL LOW (ref 13.0–17.0)
Immature Granulocytes: 1 %
Lymphocytes Relative: 32 %
Lymphs Abs: 0.9 10*3/uL (ref 0.7–4.0)
MCH: 24.7 pg — ABNORMAL LOW (ref 26.0–34.0)
MCHC: 31.4 g/dL (ref 30.0–36.0)
MCV: 78.5 fL — ABNORMAL LOW (ref 80.0–100.0)
Monocytes Absolute: 0.4 10*3/uL (ref 0.1–1.0)
Monocytes Relative: 14 %
Neutro Abs: 1.4 10*3/uL — ABNORMAL LOW (ref 1.7–7.7)
Neutrophils Relative %: 48 %
Platelets: 218 10*3/uL (ref 150–400)
RBC: 4.46 MIL/uL (ref 4.22–5.81)
RDW: 13.5 % (ref 11.5–15.5)
WBC: 2.9 10*3/uL — ABNORMAL LOW (ref 4.0–10.5)
nRBC: 0 % (ref 0.0–0.2)

## 2020-06-25 LAB — LIPASE, BLOOD: Lipase: 30 U/L (ref 11–51)

## 2020-06-25 LAB — ECHOCARDIOGRAM COMPLETE
AR max vel: 1.21 cm2
AV Area VTI: 1.26 cm2
AV Area mean vel: 1.13 cm2
AV Mean grad: 14 mmHg
AV Peak grad: 24.5 mmHg
Ao pk vel: 2.48 m/s
Area-P 1/2: 4.71 cm2
Height: 68 in
S' Lateral: 2.85 cm
Weight: 3664 oz

## 2020-06-25 LAB — BRAIN NATRIURETIC PEPTIDE: B Natriuretic Peptide: 168.5 pg/mL — ABNORMAL HIGH (ref 0.0–100.0)

## 2020-06-25 LAB — HEMOGLOBIN A1C
Hgb A1c MFr Bld: 9.1 % — ABNORMAL HIGH (ref 4.8–5.6)
Mean Plasma Glucose: 214.47 mg/dL

## 2020-06-25 LAB — GLUCOSE, CAPILLARY
Glucose-Capillary: 447 mg/dL — ABNORMAL HIGH (ref 70–99)
Glucose-Capillary: 175 mg/dL — ABNORMAL HIGH (ref 70–99)
Glucose-Capillary: 160 mg/dL — ABNORMAL HIGH (ref 70–99)

## 2020-06-25 LAB — RESP PANEL BY RT-PCR (FLU A&B, COVID) ARPGX2
Influenza A by PCR: NEGATIVE
Influenza B by PCR: NEGATIVE
SARS Coronavirus 2 by RT PCR: NEGATIVE

## 2020-06-25 LAB — TROPONIN I (HIGH SENSITIVITY)
Troponin I (High Sensitivity): 25 ng/L — ABNORMAL HIGH (ref <18)
Troponin I (High Sensitivity): 25 ng/L — ABNORMAL HIGH (ref <18)

## 2020-06-25 LAB — TSH: TSH: 1.714 u[IU]/mL (ref 0.350–4.500)

## 2020-06-25 MED ORDER — ASPIRIN 81 MG PO CHEW
324.0000 mg | CHEWABLE_TABLET | Freq: Once | ORAL | Status: AC
  Administered 2020-06-25: 324 mg via ORAL
  Filled 2020-06-25: qty 4

## 2020-06-25 MED ORDER — LABETALOL HCL 5 MG/ML IV SOLN
20.0000 mg | Freq: Once | INTRAVENOUS | Status: AC
  Administered 2020-06-25: 20 mg via INTRAVENOUS
  Filled 2020-06-25: qty 4

## 2020-06-25 MED ORDER — IOHEXOL 350 MG/ML SOLN
80.0000 mL | Freq: Once | INTRAVENOUS | Status: AC | PRN
  Administered 2020-06-25: 80 mL via INTRAVENOUS

## 2020-06-25 MED ORDER — INSULIN ASPART 100 UNIT/ML ~~LOC~~ SOLN: 12.0000 [IU] | Freq: Three times a day (TID) | SUBCUTANEOUS | Status: DC

## 2020-06-25 MED ORDER — ENOXAPARIN SODIUM 40 MG/0.4ML IJ SOSY
40.0000 mg | PREFILLED_SYRINGE | INTRAMUSCULAR | Status: DC
  Administered 2020-06-25 – 2020-06-28 (×4): 40 mg via SUBCUTANEOUS
  Filled 2020-06-25 (×4): qty 0.4

## 2020-06-25 MED ORDER — AMLODIPINE BESYLATE 10 MG PO TABS
10.0000 mg | ORAL_TABLET | Freq: Every day | ORAL | Status: DC
  Administered 2020-06-25 – 2020-06-28 (×4): 10 mg via ORAL
  Filled 2020-06-25 (×4): qty 1

## 2020-06-25 MED ORDER — MORPHINE SULFATE (PF) 2 MG/ML IV SOLN
2.0000 mg | Freq: Once | INTRAVENOUS | Status: AC
Start: 2020-06-25 — End: 2020-06-25
  Administered 2020-06-25: 2 mg via INTRAVENOUS
  Filled 2020-06-25: qty 1

## 2020-06-25 MED ORDER — ONDANSETRON HCL 4 MG/2ML IJ SOLN
4.0000 mg | Freq: Four times a day (QID) | INTRAMUSCULAR | Status: DC | PRN
  Administered 2020-06-26: 4 mg via INTRAVENOUS
  Filled 2020-06-25: qty 2

## 2020-06-25 MED ORDER — INSULIN GLARGINE 100 UNIT/ML ~~LOC~~ SOLN
20.0000 [IU] | Freq: Every day | SUBCUTANEOUS | Status: DC
  Administered 2020-06-25 – 2020-06-27 (×3): 20 [IU] via SUBCUTANEOUS
  Filled 2020-06-25 (×4): qty 0.2

## 2020-06-25 MED ORDER — ACETAMINOPHEN 325 MG PO TABS
650.0000 mg | ORAL_TABLET | ORAL | Status: DC | PRN
  Administered 2020-06-25 – 2020-06-28 (×4): 650 mg via ORAL
  Filled 2020-06-25 (×5): qty 2

## 2020-06-25 MED ORDER — INSULIN ASPART 100 UNIT/ML IJ SOLN
0.0000 [IU] | Freq: Three times a day (TID) | INTRAMUSCULAR | Status: DC
  Administered 2020-06-25: 15 [IU] via SUBCUTANEOUS
  Administered 2020-06-25 – 2020-06-26 (×3): 3 [IU] via SUBCUTANEOUS
  Administered 2020-06-26: 2 [IU] via SUBCUTANEOUS
  Administered 2020-06-27 (×2): 3 [IU] via SUBCUTANEOUS
  Administered 2020-06-28: 2 [IU] via SUBCUTANEOUS
  Administered 2020-06-28: 5 [IU] via SUBCUTANEOUS

## 2020-06-25 MED ORDER — HYDRALAZINE HCL 20 MG/ML IJ SOLN
10.0000 mg | INTRAMUSCULAR | Status: DC | PRN
  Administered 2020-06-25 – 2020-06-26 (×2): 10 mg via INTRAVENOUS
  Filled 2020-06-25 (×2): qty 1

## 2020-06-25 MED ORDER — TAMSULOSIN HCL 0.4 MG PO CAPS
0.4000 mg | ORAL_CAPSULE | Freq: Every day | ORAL | Status: DC
  Administered 2020-06-25 – 2020-06-28 (×4): 0.4 mg via ORAL
  Filled 2020-06-25 (×4): qty 1

## 2020-06-25 MED ORDER — GABAPENTIN 100 MG PO CAPS
100.0000 mg | ORAL_CAPSULE | Freq: Three times a day (TID) | ORAL | Status: DC
  Administered 2020-06-25 – 2020-06-28 (×7): 100 mg via ORAL
  Filled 2020-06-25 (×7): qty 1

## 2020-06-25 MED ORDER — INSULIN ASPART 100 UNIT/ML IJ SOLN: 0.0000 [IU] | Freq: Every day | INTRAMUSCULAR | Status: DC

## 2020-06-25 MED ORDER — ATORVASTATIN CALCIUM 10 MG PO TABS
20.0000 mg | ORAL_TABLET | Freq: Every day | ORAL | Status: DC
  Administered 2020-06-25 – 2020-06-28 (×4): 20 mg via ORAL
  Filled 2020-06-25 (×4): qty 2

## 2020-06-25 NOTE — ED Triage Notes (Signed)
Pt bib gems from [facility] c/o of SOB and CP at rest since yesterday. CP is centralized and worse with inhalation and palpation. Pt also states this morning he has a nose bleed and a headache. Pt noted to be hypertensive on ems arrival with bp of 229/112. Hx of stroke, chf.   CBG: 207  Spo2: 99% RA

## 2020-06-25 NOTE — ED Notes (Signed)
Pt returned from CT °

## 2020-06-25 NOTE — ED Provider Notes (Signed)
Patient seen after prior ED provider.    Patient would benefit from admission for further work-up and treatment of his reported symptoms today.  Hospitalist service is aware of case and will evaluate for admission.   Valarie Merino, MD 06/25/20 [phone]

## 2020-06-25 NOTE — ED Provider Notes (Signed)
[facility] EMERGENCY DEPARTMENT Provider Note   CSN: [id number] Arrival date & time: 06/25/20  0425     History Chief Complaint  Patient presents with  . Chest Pain    [name] is a 59 y.o. male.  Patient presents to the emergency department for evaluation of chest pain and shortness of breath.  Patient reports that symptoms began yesterday morning.  He has been experiencing a tightness across his lower ribs with pain that worsens when he takes a deep breath.  Area is tender to the touch as well.  He has not had any cough or fever.  Patient does report headache and has been experiencing intermittent nosebleed from the right side of his nose today.        Past Medical History:  Diagnosis Date  . Arthritis   . Bipolar affective ([place])   . CHF (congestive heart failure) ([place])   . Depression   . Diabetes mellitus    uncontrolled   . Hypertension     Patient Active Problem List   Diagnosis Date Noted  . Chronic kidney disease, stage 4 (severe) ([place]) 03/30/2020  . Morbid (severe) obesity due to excess calories ([place]) 03/30/2020  . Malnutrition of moderate degree 01/25/2020  . Palliative care by specialist   . Goals of care, counseling/discussion   . Nephrotic syndrome 01/06/2020  . Anasarca 01/06/2020  . Hypokalemia due to loss of potassium 01/04/2020  . Generalized body aches 01/01/2020  . Non-traumatic rhabdomyolysis 01/01/2020  . Grade I diastolic dysfunction 26/37/8588  . Anemia 01/01/2020  . Hypertensive urgency 12/31/2019  . Hypokalemia 12/31/2019  . Elevated CK   . Chronic diastolic CHF (congestive heart failure) ([place]) 12/31/2017  . Poorly controlled diabetes mellitus ([place])   . Pure hypercholesterolemia   . Hyperglycemia 09/28/2017  . Nonobstructive atherosclerosis of coronary artery 09/28/2017  . MDD (major depressive disorder), recurrent episode, severe ([place]) 07/30/2017  . Osteoarthritis 04/12/2015  . Severe recurrent major depression  with psychotic features ([place]) 01/04/2015  . Substance-related disorder ([place]) 10/30/2014  . Patient's noncompliance with other medical treatment and regimen 06/07/2014  . Cocaine dependence, uncomplicated ([place]) 50/27/7412  . Chronic pain 05/31/2014  . Cocaine abuse ([place]) 05/31/2014  . History of noncompliance with medical treatment 05/31/2014  . Malingering 05/31/2014  . Leukopenia 05/31/2014  . Diabetic neuropathy associated with diabetes mellitus due to underlying condition ([place]) 05/31/2014  . Diabetic neuropathy ([place]) 05/31/2014  . Congestive heart failure with left ventricular systolic dysfunction ([place]) 02/22/2014  . Depression, major, recurrent, severe with psychosis ([place]) 08/29/2013  . Suicidal ideations 08/28/2013  . Uncontrolled diabetes mellitus ([place]) 06/30/2013  . Atypical chest pain 12/09/2011  . Hypertension 12/09/2011  . Episodic mood disorder ([place]) 05/13/2011    Past Surgical History:  Procedure Laterality Date  . BOWEL DECOMPRESSION N/A 01/19/2020   Procedure: BOWEL DECOMPRESSION;  Surgeon: Otis Brace, MD;  Location: [facility];  Service: Gastroenterology;  Laterality: N/A;  . COLONOSCOPY WITH PROPOFOL N/A 01/19/2020   Procedure: COLONOSCOPY WITH PROPOFOL;  Surgeon: Otis Brace, MD;  Location: [facility];  Service: Gastroenterology;  Laterality: N/A;  . KNEE SURGERY     b/l knees; pending total knee        Family History  Problem Relation Age of Onset  . Diabetes Other   . Cancer Mother        uterus; deceased   . Heart attack Mother 55  . Hypertension Other   . Heart attack Father 41  . Stroke Sister  12/2012 age 105 y.o    Social History   Tobacco Use  . Smoking status: Former Research scientist (life sciences)  . Smokeless tobacco: Never Used  Vaping Use  . Vaping Use: Never used  Substance Use Topics  . Alcohol use: No  . Drug use: Yes    Types: Cocaine    Home Medications Prior to Admission medications   Medication Sig Start Date End Date  Taking? Authorizing Provider  amLODipine (NORVASC) 10 MG tablet Take 1 tablet (10 mg total) by mouth daily. 03/30/20 04/29/20  [name], NP  atorvastatin (LIPITOR) 20 MG tablet Take 1 tablet (20 mg total) by mouth daily. 03/30/20   [name], NP  cephALEXin (KEFLEX) 500 MG capsule Take 1 capsule (500 mg total) by mouth 3 (three) times daily. 03/08/20   Drenda Freeze, MD  doxycycline (VIBRAMYCIN) 100 MG capsule Take 1 capsule (100 mg total) by mouth 2 (two) times daily. 04/15/20   Quintella Reichert, MD  gabapentin (NEURONTIN) 100 MG capsule Take 1 capsule (100 mg total) by mouth 3 (three) times daily. 05/11/20   Valarie Merino, MD  insulin aspart (NOVOLOG) 100 UNIT/ML injection Inject 12 Units into the skin 3 (three) times daily before meals. For blood sugars 0-150 give 0 units of insulin, 151-200 give 2 units of insulin, 201-250 give 4 units, 251-300 give 6 units, 301-350 give 8 units, 351-400 give 10 units,> 400 give 12 units and call M.D. 03/30/20   [name], NP  insulin glargine (LANTUS SOLOSTAR) 100 UNIT/ML Solostar Pen Inject 20 Units into the skin at bedtime. 03/30/20   [name], NP  lisinopril-hydrochlorothiazide (ZESTORETIC) 20-25 MG tablet Take 1 tablet by mouth daily. 03/30/20   [name], NP  metFORMIN (GLUCOPHAGE) 1000 MG tablet Take 1 tablet (1,000 mg total) by mouth 2 (two) times daily with a meal. 03/30/20   [name], NP  metroNIDAZOLE (FLAGYL) 500 MG tablet Take 1 tablet (500 mg total) by mouth 2 (two) times daily. One po bid x 7 days 03/08/20   Drenda Freeze, MD  nystatin (MYCOSTATIN/NYSTOP) powder Apply 1 application topically 3 (three) times daily. Apply to groin area 03/30/20   [name], NP  polyethylene glycol (MIRALAX / GLYCOLAX) 17 g packet Take 17 g by mouth daily as needed. 03/30/20   [name], NP  tamsulosin (FLOMAX) 0.4 MG CAPS capsule Take 1 capsule (0.4 mg total) by mouth daily. 03/30/20   [name], NP    Allergies    Other  Review of Systems   Review of Systems  Respiratory: Positive for shortness of breath.   Cardiovascular: Positive for chest pain.  All other systems reviewed and are negative.   Physical Exam Updated Vital Signs BP (!) 173/101   Pulse 91   Temp 97.7 F (36.5 C) (Oral)   Resp 16   Ht 5\' 8"  (1.727 m)   Wt 103.9 kg   SpO2 95%   BMI 34.82 kg/m   Physical Exam Vitals and nursing note reviewed.  Constitutional:      General: He is not in acute distress.    Appearance: Normal appearance. He is well-developed.  HENT:     Head: Normocephalic and atraumatic.     Right Ear: Hearing normal.     Left Ear: Hearing normal.     Nose: Nose normal.  Eyes:     Conjunctiva/sclera: Conjunctivae normal.     Pupils: Pupils are equal, round, and reactive  to light.  Cardiovascular:     Rate and Rhythm: Regular rhythm.     Heart sounds: S1 normal and S2 normal. No murmur heard. No friction rub. No gallop.   Pulmonary:     Effort: Pulmonary effort is normal. No respiratory distress.     Breath sounds: Normal breath sounds.  Chest:     Chest wall: Tenderness present.    Abdominal:     General: Bowel sounds are normal. There is distension.     Palpations: Abdomen is soft.     Tenderness: There is generalized abdominal tenderness. There is no guarding or rebound. Negative signs include Murphy's sign and McBurney's sign.     Hernia: No hernia is present.  Musculoskeletal:        General: Normal range of motion.     Cervical back: Normal range of motion and neck supple.     Right lower leg: 2+ Edema present.     Left lower leg: 2+ Edema present.  Skin:    General: Skin is warm and dry.     Findings: No rash.  Neurological:     Mental Status: He is alert and oriented to person, place, and time.     GCS: GCS eye subscore is 4. GCS verbal subscore is 5. GCS motor subscore is 6.     Cranial Nerves: No cranial nerve deficit.     Sensory: No sensory  deficit.     Coordination: Coordination normal.  Psychiatric:        Speech: Speech normal.        Behavior: Behavior normal.        Thought Content: Thought content normal.     ED Results / Procedures / Treatments   Labs (all labs ordered are listed, but only abnormal results are displayed) Labs Reviewed  COMPREHENSIVE METABOLIC PANEL - Abnormal; Notable for the following components:      Result Value   Sodium 133 (*)    Glucose, Bld 215 (*)    Creatinine, Ser 1.36 (*)    Calcium 8.3 (*)    Total Protein 6.0 (*)    Albumin 1.9 (*)    All other components within normal limits  BRAIN NATRIURETIC PEPTIDE - Abnormal; Notable for the following components:   B Natriuretic Peptide 168.5 (*)    All other components within normal limits  CBC WITH DIFFERENTIAL/PLATELET - Abnormal; Notable for the following components:   WBC 2.9 (*)    Hemoglobin 11.0 (*)    HCT 35.0 (*)    MCV 78.5 (*)    MCH 24.7 (*)    Neutro Abs 1.4 (*)    All other components within normal limits  TROPONIN I (HIGH SENSITIVITY) - Abnormal; Notable for the following components:   Troponin I (High Sensitivity) 25 (*)    All other components within normal limits  LIPASE, BLOOD  CBC WITH DIFFERENTIAL/PLATELET  TROPONIN I (HIGH SENSITIVITY)    EKG EKG Interpretation  Date/Time:  Sunday Jun 25 2020 04:34:14 EDT Ventricular Rate:  94 PR Interval:  174 QRS Duration: 84 QT Interval:  376 QTC Calculation: 471 R Axis:   5 Text Interpretation: Sinus rhythm Nonspecific T abnormalities, lateral leads Confirmed by Orpah Greek ([id number]) on 06/25/2020 5:18:39 AM   Radiology DG Chest Port 1 View  Result Date: 06/25/2020 CLINICAL DATA:  60 year old male with chest pain and shortness of breath. EXAM: PORTABLE CHEST 1 VIEW COMPARISON:  Portable chest 03/08/2020 and earlier. FINDINGS: Portable AP upright view at  0504 hours. Improved lung volumes and ventilation compared to January. Normalized pulmonary vascularity.  Visualized tracheal air column is within normal limits. No pneumothorax, pleural effusion or confluent pulmonary opacity. Stable mild cardiomegaly and other mediastinal contours. No acute osseous abnormality identified. Prior cervical ACDF. Paucity of bowel gas in the upper abdomen. IMPRESSION: Resolved pulmonary edema since January. No acute cardiopulmonary abnormality. Electronically Signed   By: Genevie Ann M.D.   On: 06/25/2020 05:38    Procedures Procedures   Medications Ordered in ED Medications  labetalol (NORMODYNE) injection 20 mg (20 mg Intravenous Given 06/25/20 0636)  aspirin chewable tablet 324 mg (324 mg Oral Given 06/25/20 0639)    ED Course  I have reviewed the triage vital signs and the nursing notes.  Pertinent labs & imaging results that were available during my care of the patient were reviewed by me and considered in my medical decision making (see chart for details).    MDM Rules/Calculators/A&P                          Patient presents to the emergency department for evaluation of shortness of breath and chest pain.  Pain is bilateral, anterior and pleuritic in nature.  Patient does have a history of congestive heart failure.  No known coronary artery disease.    EKG with nonspecific T wave changes.  First troponin slightly elevated.  Examination reveals diffuse anterior chest tenderness.  There is also a pleuritic component.  We will therefore require PE rule out.  CT angiography to be performed.  Patient with elevated blood pressure at arrival.  He complains of headache as well.  We will perform CT head to rule out intracranial bleed.  Blood pressure treated with labetalol.  Will sign out to oncoming ER physician to follow-up on imaging.  Final Clinical Impression(s) / ED Diagnoses Final diagnoses:  Chest pain, unspecified type    Rx / DC Orders ED Discharge Orders    None       Orpah Greek, MD 06/25/20 [id number]

## 2020-06-25 NOTE — ED Notes (Signed)
Patient transported to CT 

## 2020-06-25 NOTE — Progress Notes (Signed)
  Echocardiogram 2D Echocardiogram has been performed.  Merrie Roof F 06/25/2020, 11:40 AM

## 2020-06-25 NOTE — H&P (Signed)
History and Physical    [name] ZOX:[id number] DOB: Feb 07, 1961 DOA: 06/25/2020  Referring MD/NP/PA: Florian Buff, MD PCP: [name], NP  Patient coming from: [facility]  Chief Complaint: Chest pain  I have personally briefly reviewed patient's old medical records in [place]   HPI: [name] is a 60 y.o. male with medical history significant of hypertension, CHF, diabetes mellitus type 2, CVA with residual right-sided weakness, depression, and remote polysubstance abuse who presents with complaints of shortness of breath and chest pain which started yesterday morning.  Pain started on all left side of his rib cage and radiates across his chest.  He describes it as a stabbing and sharp pain.  Symptoms seem to be worsened with any kind of change in changing position or activity.  Patient reports that hurts to take a deep inspiratory breath.  Denies any recent falls to his knowledge or trauma to the onset symptoms.  At baseline patient is wheelchair-bound due to prior stroke in 10/2019 that left him with residual right-sided weakness.  Patient also reports that he is recently been having issues with nosebleeds on the right side.  He reports that he does not smoke and has not done any illicit drugs in quite some time.  ED Course: Upon admission into the emergency department patient was seen to be afebrile, respirations 7-26, blood pressures elevated up to 201/112, and all other vital signs maintained.  Labs significant for WBC 2.9, hemoglobin 11 with low MCV/MCH, sodium 133, BUN 11, creatinine 1.36, calcium 8.3, glucose 215, anion gap 8, albumin 1.9, BNP 168.5, and troponin 25.  Chest x-ray revealed resolved pulmonary edema since prior exam.  CT of the brain showed no acute abnormality.  CT imaging did mention a right vertex scalp abnormality resembling a small hematoma, and a age-indeterminate lacunar infarct of the left caudate nucleus that was new from  previous imaging in 2019.  CT angiogram of the chest showed no acute pulmonary infarct but pulmonary artery hypertension and vaginogram glass opacities throughout both lungs, small bilateral pleural effusions, and bilateral thyroid nodules.  He had initially been given labetalol 20 mg IV, morphine, and aspirin.  TRH called to admit for chest pain work-up.  Review of Systems  Constitutional: Negative for fever.  HENT: Positive for nosebleeds. Negative for hearing loss.   Eyes: Negative for photophobia and pain.  Respiratory: Positive for shortness of breath.   Cardiovascular: Positive for chest pain and leg swelling.  Gastrointestinal: Negative for abdominal pain and vomiting.  Musculoskeletal: Negative for falls.  Neurological: Negative for loss of consciousness.  Psychiatric/Behavioral: Negative for substance abuse and suicidal ideas.    Past Medical History:  Diagnosis Date  . Arthritis   . Bipolar affective ([place])   . CHF (congestive heart failure) ([place])   . Depression   . Diabetes mellitus    uncontrolled   . Hypertension     Past Surgical History:  Procedure Laterality Date  . BOWEL DECOMPRESSION N/A 01/19/2020   Procedure: BOWEL DECOMPRESSION;  Surgeon: Otis Brace, MD;  Location: [facility];  Service: Gastroenterology;  Laterality: N/A;  . COLONOSCOPY WITH PROPOFOL N/A 01/19/2020   Procedure: COLONOSCOPY WITH PROPOFOL;  Surgeon: Otis Brace, MD;  Location: [facility];  Service: Gastroenterology;  Laterality: N/A;  . KNEE SURGERY     b/l knees; pending total knee      reports that he has quit smoking. He has never used smokeless tobacco. He reports current  drug use. Drug: Cocaine. He reports that he does not drink alcohol.  Allergies  Allergen Reactions  . Other Anaphylaxis, Itching and Swelling    Allergy to GRITS , unknown exactly the specific ingredient he is allergic too    Family History  Problem Relation Age of Onset  . Diabetes Other   .  Cancer Mother        uterus; deceased   . Heart attack Mother 1  . Hypertension Other   . Heart attack Father 21  . Stroke Sister        12/2012 age 52 y.o    Prior to Admission medications   Medication Sig Start Date End Date Taking? Authorizing Provider  amLODipine (NORVASC) 10 MG tablet Take 1 tablet (10 mg total) by mouth daily. 03/30/20 04/29/20  [name], NP  atorvastatin (LIPITOR) 20 MG tablet Take 1 tablet (20 mg total) by mouth daily. 03/30/20   [name], NP  cephALEXin (KEFLEX) 500 MG capsule Take 1 capsule (500 mg total) by mouth 3 (three) times daily. 03/08/20   Drenda Freeze, MD  doxycycline (VIBRAMYCIN) 100 MG capsule Take 1 capsule (100 mg total) by mouth 2 (two) times daily. 04/15/20   Quintella Reichert, MD  gabapentin (NEURONTIN) 100 MG capsule Take 1 capsule (100 mg total) by mouth 3 (three) times daily. 05/11/20   Valarie Merino, MD  insulin aspart (NOVOLOG) 100 UNIT/ML injection Inject 12 Units into the skin 3 (three) times daily before meals. For blood sugars 0-150 give 0 units of insulin, 151-200 give 2 units of insulin, 201-250 give 4 units, 251-300 give 6 units, 301-350 give 8 units, 351-400 give 10 units,> 400 give 12 units and call M.D. 03/30/20   [name], NP  insulin glargine (LANTUS SOLOSTAR) 100 UNIT/ML Solostar Pen Inject 20 Units into the skin at bedtime. 03/30/20   [name], NP  lisinopril-hydrochlorothiazide (ZESTORETIC) 20-25 MG tablet Take 1 tablet by mouth daily. 03/30/20   [name], NP  metFORMIN (GLUCOPHAGE) 1000 MG tablet Take 1 tablet (1,000 mg total) by mouth 2 (two) times daily with a meal. 03/30/20   [name], NP  metroNIDAZOLE (FLAGYL) 500 MG tablet Take 1 tablet (500 mg total) by mouth 2 (two) times daily. One po bid x 7 days 03/08/20   Drenda Freeze, MD  nystatin (MYCOSTATIN/NYSTOP) powder Apply 1 application topically 3 (three) times daily. Apply to groin area 03/30/20   [name],  NP  polyethylene glycol (MIRALAX / GLYCOLAX) 17 g packet Take 17 g by mouth daily as needed. 03/30/20   [name], NP  tamsulosin (FLOMAX) 0.4 MG CAPS capsule Take 1 capsule (0.4 mg total) by mouth daily. 03/30/20   [name], NP    Physical Exam:  Constitutional: Middle-age male who appears older than stated age 60:   06/25/20 0730 06/25/20 0745 06/25/20 0800 06/25/20 0832  BP:  (!) 163/99 (!) 151/96 (!) 169/83  Pulse:   90 87  Resp: 13 20 15 15   Temp:      TempSrc:      SpO2:   95% 94%  Weight:      Height:       Eyes: PERRL, lids and conjunctivae normal ENMT: Mucous membranes are moist. Posterior pharynx clear of any exudate or lesions.Poor dentition Neck: normal, supple, no masses, no thyromegaly.  No JVD Respiratory: clear to auscultation bilaterally, no wheezing, no crackles. Normal respiratory effort. No accessory muscle  use.  Cardiovascular: Regular rate and rhythm, no murmurs / rubs / gallops.  1+ pitting lower extremity edema. 2+ pedal pulses. No carotid bruits.  Abdomen: no tenderness, no masses palpated. No hepatosplenomegaly. Bowel sounds positive.  Musculoskeletal: no clubbing / cyanosis. No joint deformity upper and lower extremities. Good ROM, no contractures. Normal muscle tone.  Skin: no rashes, lesions, ulcers. No induration Neurologic: CN 2-12 grossly intact. Sensation intact, DTR normal.  Right-sided weakness appreciated. Psychiatric: Normal judgment and insight. Alert and oriented x 3. Normal mood.     Labs on Admission: I have personally reviewed following labs and imaging studies  CBC: Recent Labs  Lab 06/25/20 0538  WBC 2.9*  NEUTROABS 1.4*  HGB 11.0*  HCT 35.0*  MCV 78.5*  PLT 096   Basic Metabolic Panel: Recent Labs  Lab 06/25/20 0456  NA 133*  K 4.0  CL 103  CO2 22  GLUCOSE 215*  BUN 11  CREATININE 1.36*  CALCIUM 8.3*   GFR: Estimated Creatinine Clearance: 69.2 mL/min (A) (by C-G formula based on SCr of 1.36  mg/dL (H)). Liver Function Tests: Recent Labs  Lab 06/25/20 0456  AST 20  ALT 13  ALKPHOS 102  BILITOT 0.6  PROT 6.0*  ALBUMIN 1.9*   Recent Labs  Lab 06/25/20 0456  LIPASE 30   No results for input(s): AMMONIA in the last 168 hours. Coagulation Profile: No results for input(s): INR, PROTIME in the last 168 hours. Cardiac Enzymes: No results for input(s): CKTOTAL, CKMB, CKMBINDEX, TROPONINI in the last 168 hours. BNP (last 3 results) No results for input(s): PROBNP in the last 8760 hours. HbA1C: No results for input(s): HGBA1C in the last 72 hours. CBG: No results for input(s): GLUCAP in the last 168 hours. Lipid Profile: No results for input(s): CHOL, HDL, LDLCALC, TRIG, CHOLHDL, LDLDIRECT in the last 72 hours. Thyroid Function Tests: No results for input(s): TSH, T4TOTAL, FREET4, T3FREE, THYROIDAB in the last 72 hours. Anemia Panel: No results for input(s): VITAMINB12, FOLATE, FERRITIN, TIBC, IRON, RETICCTPCT in the last 72 hours. Urine analysis:    Component Value Date/Time   COLORURINE YELLOW 03/08/2020 1946   APPEARANCEUR TURBID (A) 03/08/2020 1946   APPEARANCEUR Clear 12/31/2013 2116   LABSPEC 1.016 03/08/2020 1946   LABSPEC 1.032 12/31/2013 2116   PHURINE 6.0 03/08/2020 1946   GLUCOSEU >=500 (A) 03/08/2020 1946   GLUCOSEU >=500 12/31/2013 2116   HGBUR NEGATIVE 03/08/2020 1946   BILIRUBINUR NEGATIVE 03/08/2020 1946   BILIRUBINUR Negative 12/31/2013 2116   KETONESUR NEGATIVE 03/08/2020 1946   PROTEINUR >=300 (A) 03/08/2020 1946   UROBILINOGEN 1.0 08/28/2013 1709   NITRITE NEGATIVE 03/08/2020 1946   LEUKOCYTESUR LARGE (A) 03/08/2020 1946   LEUKOCYTESUR Negative 12/31/2013 2116   Sepsis Labs: No results found for this or any previous visit (from the past 240 hour(s)).   Radiological Exams on Admission: CT HEAD WO CONTRAST  Result Date: 06/25/2020 CLINICAL DATA:  60 year old male with headache and chest pain. EXAM: CT HEAD WITHOUT CONTRAST TECHNIQUE:  Contiguous axial images were obtained from the base of the skull through the vertex without intravenous contrast. COMPARISON:  Head CT 09/28/2017. FINDINGS: Brain: Small new hypodensity in the left caudate on series 2, image 15. Subtle dystrophic bilateral basal ganglia calcifications. Generalized cerebral volume loss since 2019. No midline shift, ventriculomegaly, mass effect, evidence of mass lesion, intracranial hemorrhage or evidence of cortically based acute infarction. Outside of the left caudate gray-white matter differentiation appears stable and within normal limits. Vascular: Calcified atherosclerosis at  the skull base. No suspicious intracranial vascular hyperdensity. Skull: No acute osseous abnormality identified. Sinuses/Orbits: Visualized paranasal sinuses and mastoids are stable and well aerated. Other: There is a broad-based right vertex scalp fluid collection most resembling hematoma on series 3, image 67. Underlying calvarium appears stable and intact. Otherwise stable and negative visible orbit and scalp soft tissues. IMPRESSION: 1. Right vertex scalp abnormality most resembling a small scalp hematoma. Underlying calvarium intact. 2. Age indeterminate lacunar infarct of the left caudate nucleus, new since 2019. Query right side weakness. 3. No other acute intracranial abnormality identified. Electronically Signed   By: Genevie Ann M.D.   On: 06/25/2020 08:04   CT ANGIO CHEST PE W OR WO CONTRAST  Result Date: 06/25/2020 CLINICAL DATA:  Chest pain and headache. EXAM: CT ANGIOGRAPHY CHEST WITH CONTRAST TECHNIQUE: Multidetector CT imaging of the chest was performed using the standard protocol during bolus administration of intravenous contrast. Multiplanar CT image reconstructions and MIPs were obtained to evaluate the vascular anatomy. CONTRAST:  21mL OMNIPAQUE IOHEXOL 350 MG/ML SOLN COMPARISON:  12/12/2019 FINDINGS: Cardiovascular: Heart is enlarged. Small pericardial effusion has increased minimally  in the interval. Coronary artery calcification is evident. Atherosclerotic calcification is noted in the wall of the thoracic aorta. Enlargement of the pulmonary outflow tract and main pulmonary arteries suggests pulmonary arterial hypertension. There is no filling defect within the opacified pulmonary arteries to suggest the presence of an acute pulmonary embolus. Mediastinum/Nodes: Bilateral thyroid nodules evident measuring up to 2.6 cm on the right. No mediastinal lymphadenopathy. Upper normal hilar lymph nodes evident bilaterally. The esophagus has normal imaging features. There is no axillary lymphadenopathy. Lungs/Pleura: Patchy ground-glass opacity is identified in the upper lungs bilaterally with dependent ground-glass airspace disease in the lower lobes, right greater than left and potentially atelectasis. Several tiny pulmonary nodules are evident but no overtly suspicious pulmonary nodule or mass. Small bilateral pleural effusions. Upper Abdomen: Calcified gallstone incompletely visualized but measuring up to at least 2.2 cm. Musculoskeletal: No worrisome lytic or sclerotic osseous abnormality. Bilateral gynecomastia with body wall edema. Review of the MIP images confirms the above findings. IMPRESSION: 1. No CT evidence for acute pulmonary embolus. 2. Enlargement of the pulmonary outflow tract and main pulmonary arteries suggests pulmonary arterial hypertension. 3. Patchy ground-glass opacity in the upper lungs bilaterally with dependent ground-glass airspace disease in the lower lobes, right greater than left. Basilar findings may reflect atelectasis although superimposed infectious/inflammatory etiology not excluded. 4. Small bilateral pleural effusions. 5. Bilateral thyroid nodules measuring up to 2.6 cm on the right. Recommend thyroid US (ref: J Am Coll Radiol. 2015 Feb;12(2): 143-50). 6. Cholelithiasis. 7. Aortic Atherosclerosis (ICD10-I70.0). Electronically Signed   By: Misty Stanley M.D.   On:  06/25/2020 08:02   DG Chest Port 1 View  Result Date: 06/25/2020 CLINICAL DATA:  60 year old male with chest pain and shortness of breath. EXAM: PORTABLE CHEST 1 VIEW COMPARISON:  Portable chest 03/08/2020 and earlier. FINDINGS: Portable AP upright view at 0504 hours. Improved lung volumes and ventilation compared to January. Normalized pulmonary vascularity. Visualized tracheal air column is within normal limits. No pneumothorax, pleural effusion or confluent pulmonary opacity. Stable mild cardiomegaly and other mediastinal contours. No acute osseous abnormality identified. Prior cervical ACDF. Paucity of bowel gas in the upper abdomen. IMPRESSION: Resolved pulmonary edema since January. No acute cardiopulmonary abnormality. Electronically Signed   By: Genevie Ann M.D.   On: 06/25/2020 05:38    EKG: Independently reviewed.  Sinus rhythm at 94 bpm with nonspecific T  wave abnormality in lateral leads  Assessment/Plan Atypical chest pain elevated troponin: Patient presents with complaints of pain that radiates across his chest.  High-sensitivity troponin 25->25.  No significant ischemic changes appreciated.  Symptoms appear to be less likely cardiac in nature. -Admit to a telemetry bed -Check echocardiogram -Check UDS and lipid panel -Cardiology notified of the patient's admission, we will follow-up for any further recommendations  Hypertensive urgency: Acute.  On admission blood pressures elevated to 201/112.  Patient had initially been given 20 mg of labetalol IV with improvement in blood pressure. -Continue home blood pressure regimen once medication regimen -Hydralazine IV as needed for elevated blood pressure greater than 180/110  Pulmonary artery hypertension diastolic CHF: Patient presents with complaints of shortness of breath as well.  BNP was just mildly elevated at 168.5.  Chest x-ray revealed resolved edema from previous chest x-ray, but noted to have some small bilateral pleural effusions  and concern for pulmonary hypertension by CT.  Last echocardiogram revealed EF of 65 to 70% with grade 1 diastolic dysfunction in 04/5595. -Follow-up echocardiogram  Diabetes mellitus type 2, uncontrolled: On admission patient's glucose elevated up to 215.  Last hemoglobin A1c 8.2 in 03/2020.  Home regimen appears to include metformin 1000 mg twice daily, Lantus 20 units nightly, and NovoLog 12 units with meals. -Hypoglycemic protocols -Continue current insulin regimen -CBGs daily with meals and nightly with sensitive SSI -Adjust insulin regimen as needed  Microcytic hypochromic anemia: Chronic.  Hemoglobin 11 g/dL which appears improved from previous.  MCV and MCH both were noted to be low.  Iron studies from 12/2019 also showed concern for iron deficiency.  Patient appears to have had 2 attempts of colonoscopy 12/2019 and 12/21, but patient had inadequate prep. -Recommend patient to follow-up with Dr. Rosalie Gums  Chronic kidney disease stage II: Creatinine 1.36 on admission which appears around patient's baseline. -Continue to monitor  Leukopenia: Acute on chronic.  WBC 2.9 which appears slightly lower than previously in the past. -Continue to monitor  Hypoalbuminemia: Acute.  Albumin noted to be 1.9 on admission. -Check prealbumin in a.m.  History of CVA with residual deficit: Patient with known history of stroke in 10/2019 which left him with residual right-sided weakness and mostly wheelchair-bound. -PT to eval in a.m.  Thyroid nodules -Add-on TSH -Recommend follow-up thyroid ultrasound in outpatient setting  Obesity: BMI 34.82 kg/m -Continue to recommend dietary and lifestyle modifications DVT prophylaxis: Lovenox Code Status: Full Family Communication: None requested Disposition Plan: Discharge back to nursing facility once medically stable Consults called: Cardiology Admission status: Observation, suspect less than 24-hour hospital stay and work-up of chest pain  Norval Morton MD [facility]   If 7PM-7AM, please contact night-coverage   06/25/2020, 9:25 AM

## 2020-06-26 ENCOUNTER — Observation Stay (HOSPITAL_COMMUNITY): Payer: Medicare HMO

## 2020-06-26 DIAGNOSIS — I10 Essential (primary) hypertension: Secondary | ICD-10-CM | POA: Diagnosis not present

## 2020-06-26 DIAGNOSIS — I2721 Secondary pulmonary arterial hypertension: Secondary | ICD-10-CM | POA: Diagnosis not present

## 2020-06-26 DIAGNOSIS — R079 Chest pain, unspecified: Secondary | ICD-10-CM | POA: Diagnosis not present

## 2020-06-26 DIAGNOSIS — R109 Unspecified abdominal pain: Secondary | ICD-10-CM | POA: Diagnosis not present

## 2020-06-26 DIAGNOSIS — R0789 Other chest pain: Secondary | ICD-10-CM | POA: Diagnosis not present

## 2020-06-26 DIAGNOSIS — I5033 Acute on chronic diastolic (congestive) heart failure: Secondary | ICD-10-CM | POA: Diagnosis not present

## 2020-06-26 DIAGNOSIS — D72819 Decreased white blood cell count, unspecified: Secondary | ICD-10-CM | POA: Diagnosis not present

## 2020-06-26 DIAGNOSIS — E1165 Type 2 diabetes mellitus with hyperglycemia: Secondary | ICD-10-CM | POA: Diagnosis not present

## 2020-06-26 LAB — GLUCOSE, CAPILLARY
Glucose-Capillary: 136 mg/dL — ABNORMAL HIGH (ref 70–99)
Glucose-Capillary: 154 mg/dL — ABNORMAL HIGH (ref 70–99)
Glucose-Capillary: 221 mg/dL — ABNORMAL HIGH (ref 70–99)

## 2020-06-26 MED ORDER — FUROSEMIDE 10 MG/ML IJ SOLN
40.0000 mg | Freq: Two times a day (BID) | INTRAMUSCULAR | Status: DC
  Administered 2020-06-26 – 2020-06-27 (×3): 40 mg via INTRAVENOUS
  Filled 2020-06-26 (×3): qty 4

## 2020-06-26 NOTE — TOC Initial Note (Signed)
Transition of Care [facility]) - Initial/Assessment Note    Patient Details  Name: [name] MRN: [id number] Date of Birth: [birth year]  Transition [facility] CM/SW Contact:    Trula Ore, [place] Phone Number: 06/26/2020, 11:51 AM  Clinical Narrative:           CSW spoke with patient. Patient confirmed he is from [facility]. Patient says his plan is to return to ALF when medically ready. CSW called Hazel at [facility] who also confirmed that patient comes from ALF. Onalee Hua confirmed that when patient is ready for discharge to fax over [facility] summary, and Covid result to [phone]. CSW will continue to follow and assist with discharge planning for patient.          Expected Discharge Plan: Assisted Living ([facility]) Barriers to Discharge: Continued Medical Work up   Patient Goals and CMS Choice Patient states their goals for this hospitalization and ongoing recovery are:: to go to ALF ([facility]) CMS Medicare.gov Compare Post Acute Care list provided to:: Patient Choice offered to / list presented to : Patient  Expected Discharge Plan and Services Expected Discharge Plan: Assisted Living ([facility]) In-house Referral: Clinical Social Work     Living arrangements for the past 2 months: [place] ([facility])                                      Prior Living Arrangements/Services Living arrangements for the past 2 months: [place] ([place]) Lives with:: Self,Facility Resident (From [facility]) Patient language and need for interpreter reviewed:: Yes Do you feel safe going back to the place where you live?: Yes      Need for Family Participation in Patient Care: Yes (Comment) Care giver support system in place?: Yes (comment)   Criminal Activity/Legal Involvement Pertinent to Current Situation/Hospitalization: No - Comment as needed  Activities of Daily Living Home Assistive  Devices/Equipment: Cane (specify quad or straight),Grab bars in shower,Grab bars around toilet,Hand-held shower hose,Wheelchair ADL Screening (condition at time of admission) Patient's cognitive ability adequate to safely complete daily activities?: Yes Is the patient deaf or have difficulty hearing?: No Does the patient have difficulty seeing, even when wearing glasses/contacts?: No Does the patient have difficulty concentrating, remembering, or making decisions?: Yes Patient able to express need for assistance with ADLs?: Yes Does the patient have difficulty dressing or bathing?: Yes Independently performs ADLs?: No Communication: Independent Dressing (OT): Needs assistance Is this a change from baseline?: Pre-admission baseline Grooming: Needs assistance Is this a change from baseline?: Pre-admission baseline Feeding: Independent Bathing: Needs assistance Is this a change from baseline?: Pre-admission baseline Toileting: Needs assistance Is this a change from baseline?: Pre-admission baseline In/Out Bed: Needs assistance Is this a change from baseline?: Pre-admission baseline Walks in Home: Dependent Is this a change from baseline?: Pre-admission baseline Does the patient have difficulty walking or climbing stairs?: Yes Weakness of Legs: Both Weakness of Arms/Hands: Both  Permission Sought/Granted Permission sought to share information with : Case Manager,Family Chief Financial Officer Permission granted to share information with : Yes, Verbal Permission Granted  Share Information with NAME: [name]  Permission granted to share info w AGENCY: ALF  Permission granted to share info w Relationship: brother  Permission granted to share info w Contact Information: [name] [phone]  Emotional Assessment  Attitude/Demeanor/Rapport: Gracious Affect (typically observed): Calm Orientation: : Oriented to Self,Oriented to Place,Oriented to  Time,Oriented to Situation  (WDL) Alcohol / Substance Use: Not Applicable Psych Involvement: No (comment)  Admission diagnosis:  Chest pain [R07.9] Chest pain, unspecified type [R07.9] Patient Active Problem List   Diagnosis Date Noted  . Chest pain 06/25/2020  . Pulmonary artery hypertension ([place]) 06/25/2020  . Chronic kidney disease, stage II (mild) 06/25/2020  . History of cerebrovascular accident (CVA) with residual deficit 06/25/2020  . Chronic kidney disease, stage 4 (severe) ([place]) 03/30/2020  . Morbid (severe) obesity due to excess calories ([place]) 03/30/2020  . Malnutrition of moderate degree 01/25/2020  . Palliative care by specialist   . Goals of care, counseling/discussion   . Nephrotic syndrome 01/06/2020  . Anasarca 01/06/2020  . Hypokalemia due to loss of potassium 01/04/2020  . Generalized body aches 01/01/2020  . Non-traumatic rhabdomyolysis 01/01/2020  . Grade I diastolic dysfunction 57/50/5183  . Microcytic anemia 01/01/2020  . Hypertensive urgency 12/31/2019  . Hypokalemia 12/31/2019  . Elevated CK   . Chronic diastolic CHF (congestive heart failure) ([place]) 12/31/2017  . Diabetes mellitus type 2, uncontrolled ([place])   . Pure hypercholesterolemia   . Hyperglycemia 09/28/2017  . Nonobstructive atherosclerosis of coronary artery 09/28/2017  . MDD (major depressive disorder), recurrent episode, severe ([place]) 07/30/2017  . Osteoarthritis 04/12/2015  . Severe recurrent major depression with psychotic features ([place]) 01/04/2015  . Substance-related disorder ([place]) 10/30/2014  . Patient's noncompliance with other medical treatment and regimen 06/07/2014  . Cocaine dependence, uncomplicated ([place]) 35/82/5189  . Chronic pain 05/31/2014  . Cocaine abuse ([place]) 05/31/2014  . History of noncompliance with medical treatment 05/31/2014  . Malingering 05/31/2014  . Leukopenia 05/31/2014  . Diabetic neuropathy associated with diabetes mellitus due to underlying condition ([place]) 05/31/2014  . Diabetic  neuropathy ([place]) 05/31/2014  . Congestive heart failure with left ventricular systolic dysfunction ([place]) 02/22/2014  . Depression, major, recurrent, severe with psychosis ([place]) 08/29/2013  . Suicidal ideations 08/28/2013  . Uncontrolled diabetes mellitus ([place]) 06/30/2013  . Atypical chest pain 12/09/2011  . Hypertension 12/09/2011  . Episodic mood disorder ([place]) 05/13/2011   PCP:  [name], NP Pharmacy:   [id number] [address], [place] [address] Phone: [phone] Fax: [phone]     Social Determinants of Health (SDOH) Interventions    Readmission Risk Interventions Readmission Risk Prevention Plan 01/12/2020  Transportation Screening Complete  Palliative Care Screening Not Applicable  Some recent data might be hidden

## 2020-06-26 NOTE — Progress Notes (Signed)
[facility]  PROGRESS NOTE  [name] PYP:[id number] DOB: 1960-08-30 DOA: 06/25/2020 PCP: [name], NP   Brief HPI:   60 year old male with history of hypertension, CHF, diabetes mellitus type 2, CVA with residual right-sided weakness, depression, remote polysubstance abuse presents with complaints of shortness of breath, chest pain.  In the ED lab work showed troponin 25, BNP 168.5.  Chest x-ray showed resolved pulmonary edema since prior exam.  CT brain showed no acute abnormality.  CT imaging did mention right vertex scalp abnormality resembling small scalp hematoma, age-indeterminate lacunar infarct of the left choroid nucleus that was new from previous imaging in 2019.  CTA chest showed no acute pulmonary infarct with pulmonary artery hypertension groundglass opacities throughout both lungs, small bilateral pleural effusion and bilateral thyroid nodules.    Subjective   Patient seen and examined, continues to have intermittent chest pain.   Assessment/Plan:     1. Chest pain-patient has mild elevation of troponin, EKG showed nonspecific T wave abnormality in lateral leads.  Echocardiogram shows grade 1 diastolic dysfunction.  Moderately elevated pulmonary artery systolic pressure.  Will consult cardiology for further recommendations. 2. Hypertensive urgency-blood pressure was elevated to 201/112.  Improved with IV labetalol.  Blood pressure is better controlled, continue amlodipine 10 mg daily.  Continue as needed hydralazine. 3. Pulmonary artery hypertension-echocardiogram shows moderately reduced pulmonary artery pressure.  Patient currently not requiring oxygen. 4. Diabetes mellitus type 2-hemoglobin A1c is 9.1.  Continue sliding scale insulin NovoLog, Lantus 20 subcu daily.  CBG well controlled. 5. Leukopenia-acute on chronic, WBC 2.9 appears to be slightly lower than previously in the past.  Follow CBC in a.m. 6. Iron deficiency anemia-patient's hemoglobin is 11,  serum iron studies from November 2021 showed serum iron of 33 with saturation 13%.  Patient is followed by [facility].  He had 2 attempts of colonoscopy November 2021 and December 2021 but patient had inadequate prep.  Follow-up as outpatient.   7. Hypoalbuminemia-unclear etiology, albumin was 2.2 in January 2022,.  He presented with albumin of 1.9.  Protein nutrition consult. 8. CKD stage II-creatinine is 1.36, at baseline. 9. History of CVA with residual deficit-patient has known history of CVA in September 2021 which left him with residual right-sided weakness and mostly wheelchair-bound.  PT evaluation. 10. Thyroid nodule-CTA chest shows bilateral thyroid nodules measuring 2.6 cm on the right.  TSH 1.714.  Will obtain thyroid ultrasound.   Scheduled medications:   . amLODipine  10 mg Oral Daily  . atorvastatin  20 mg Oral Daily  . enoxaparin (LOVENOX) injection  40 mg Subcutaneous Q24H  . furosemide  40 mg Intravenous BID  . gabapentin  100 mg Oral TID  . insulin aspart  0-15 Units Subcutaneous TID WC  . insulin glargine  20 Units Subcutaneous QHS  . tamsulosin  0.4 mg Oral Daily         Data Reviewed:   CBG:  Recent Labs  Lab 06/25/20 1325 06/25/20 1625 06/25/20 2104 06/26/20 0731 06/26/20 1206  GLUCAP 447* 175* 160* 136* 154*    SpO2: 95 %    Vitals:   06/26/20 0538 06/26/20 0559 06/26/20 0839 06/26/20 1410  BP: (!) 145/78 (!) 150/94 128/76 (!) 149/94  Pulse:   88 86  Resp:    18  Temp:    97.7 F (36.5 C)  TempSrc:    Oral  SpO2:   91% 95%  Weight:      Height:  Intake/Output Summary (Last 24 hours) at 06/26/2020 1838 Last data filed at 06/26/2020 1400 Gross per 24 hour  Intake 400 ml  Output 675 ml  Net -275 ml    04/30 1901 - 05/02 0700 In: -  Out: 1500 [Urine:1500]  Filed Weights   06/25/20 0431  Weight: 103.9 kg    CBC:  Recent Labs  Lab 06/25/20 0538  WBC 2.9*  HGB 11.0*  HCT 35.0*  PLT 218  MCV 78.5*  MCH 24.7*  MCHC 31.4   RDW 13.5  LYMPHSABS 0.9  MONOABS 0.4  EOSABS 0.1  BASOSABS 0.0    Complete metabolic panel:  Recent Labs  Lab 06/25/20 0456 06/25/20 1244 06/25/20 1314  NA 133*  --   --   K 4.0  --   --   CL 103  --   --   CO2 22  --   --   GLUCOSE 215*  --   --   BUN 11  --   --   CREATININE 1.36*  --   --   CALCIUM 8.3*  --   --   AST 20  --   --   ALT 13  --   --   ALKPHOS 102  --   --   BILITOT 0.6  --   --   ALBUMIN 1.9*  --   --   TSH  --  1.714  --   HGBA1C  --   --  9.1*  BNP 168.5*  --   --     Recent Labs  Lab 06/25/20 0456  LIPASE 30    Recent Labs  Lab 06/25/20 0456 06/25/20 0832  BNP 168.5*  --   SARSCOV2NAA  --  NEGATIVE    ------------------------------------------------------------------------------------------------------------------ Recent Labs    06/25/20 0711  CHOL 305*  HDL 63  LDLCALC 224*  TRIG 90  CHOLHDL 4.8    Lab Results  Component Value Date   HGBA1C 9.1 (H) 06/25/2020   ------------------------------------------------------------------------------------------------------------------ Recent Labs    06/25/20 1244  TSH 1.714   ------------------------------------------------------------------------------------------------------------------ No results for input(s): VITAMINB12, FOLATE, FERRITIN, TIBC, IRON, RETICCTPCT in the last 72 hours.  Coagulation profile No results for input(s): INR, PROTIME in the last 168 hours. No results for input(s): DDIMER in the last 72 hours.  Cardiac Enzymes No results for input(s): CKTOTAL, CKMB, CKMBINDEX, TROPONINI in the last 168 hours.  ------------------------------------------------------------------------------------------------------------------    Component Value Date/Time   BNP 168.5 (H) 06/25/2020 0456     Antibiotics: Anti-infectives (From admission, onward)   None       Radiology Reports  DG Abd 1 View  Result Date: 06/26/2020 CLINICAL DATA:  Onset abdominal pain and  vomiting yesterday. EXAM: ABDOMEN - 1 VIEW COMPARISON:  Single-view of the abdomen 01/27/2020. FINDINGS: Mild gaseous distention of the stomach. No small bowel dilatation. Large stool burden in the ascending and transverse colon noted. No unexpected abdominal calcification. IMPRESSION: Mild gaseous distention of the stomach is likely incidental. Large stool burden ascending and transverse colon. Electronically Signed   By: Inge Rise M.D.   On: 06/26/2020 12:10   CT HEAD WO CONTRAST  Result Date: 06/25/2020 CLINICAL DATA:  60 year old male with headache and chest pain. EXAM: CT HEAD WITHOUT CONTRAST TECHNIQUE: Contiguous axial images were obtained from the base of the skull through the vertex without intravenous contrast. COMPARISON:  Head CT 09/28/2017. FINDINGS: Brain: Small new hypodensity in the left caudate on series 2, image 15. Subtle dystrophic bilateral basal ganglia  calcifications. Generalized cerebral volume loss since 2019. No midline shift, ventriculomegaly, mass effect, evidence of mass lesion, intracranial hemorrhage or evidence of cortically based acute infarction. Outside of the left caudate gray-white matter differentiation appears stable and within normal limits. Vascular: Calcified atherosclerosis at the skull base. No suspicious intracranial vascular hyperdensity. Skull: No acute osseous abnormality identified. Sinuses/Orbits: Visualized paranasal sinuses and mastoids are stable and well aerated. Other: There is a broad-based right vertex scalp fluid collection most resembling hematoma on series 3, image 67. Underlying calvarium appears stable and intact. Otherwise stable and negative visible orbit and scalp soft tissues. IMPRESSION: 1. Right vertex scalp abnormality most resembling a small scalp hematoma. Underlying calvarium intact. 2. Age indeterminate lacunar infarct of the left caudate nucleus, new since 2019. Query right side weakness. 3. No other acute intracranial abnormality  identified. Electronically Signed   By: Genevie Ann M.D.   On: 06/25/2020 08:04   CT ANGIO CHEST PE W OR WO CONTRAST  Result Date: 06/25/2020 CLINICAL DATA:  Chest pain and headache. EXAM: CT ANGIOGRAPHY CHEST WITH CONTRAST TECHNIQUE: Multidetector CT imaging of the chest was performed using the standard protocol during bolus administration of intravenous contrast. Multiplanar CT image reconstructions and MIPs were obtained to evaluate the vascular anatomy. CONTRAST:  47mL OMNIPAQUE IOHEXOL 350 MG/ML SOLN COMPARISON:  12/12/2019 FINDINGS: Cardiovascular: Heart is enlarged. Small pericardial effusion has increased minimally in the interval. Coronary artery calcification is evident. Atherosclerotic calcification is noted in the wall of the thoracic aorta. Enlargement of the pulmonary outflow tract and main pulmonary arteries suggests pulmonary arterial hypertension. There is no filling defect within the opacified pulmonary arteries to suggest the presence of an acute pulmonary embolus. Mediastinum/Nodes: Bilateral thyroid nodules evident measuring up to 2.6 cm on the right. No mediastinal lymphadenopathy. Upper normal hilar lymph nodes evident bilaterally. The esophagus has normal imaging features. There is no axillary lymphadenopathy. Lungs/Pleura: Patchy ground-glass opacity is identified in the upper lungs bilaterally with dependent ground-glass airspace disease in the lower lobes, right greater than left and potentially atelectasis. Several tiny pulmonary nodules are evident but no overtly suspicious pulmonary nodule or mass. Small bilateral pleural effusions. Upper Abdomen: Calcified gallstone incompletely visualized but measuring up to at least 2.2 cm. Musculoskeletal: No worrisome lytic or sclerotic osseous abnormality. Bilateral gynecomastia with body wall edema. Review of the MIP images confirms the above findings. IMPRESSION: 1. No CT evidence for acute pulmonary embolus. 2. Enlargement of the pulmonary  outflow tract and main pulmonary arteries suggests pulmonary arterial hypertension. 3. Patchy ground-glass opacity in the upper lungs bilaterally with dependent ground-glass airspace disease in the lower lobes, right greater than left. Basilar findings may reflect atelectasis although superimposed infectious/inflammatory etiology not excluded. 4. Small bilateral pleural effusions. 5. Bilateral thyroid nodules measuring up to 2.6 cm on the right. Recommend thyroid US (ref: J Am Coll Radiol. 2015 Feb;12(2): 143-50). 6. Cholelithiasis. 7. Aortic Atherosclerosis (ICD10-I70.0). Electronically Signed   By: Misty Stanley M.D.   On: 06/25/2020 08:02   DG Chest Port 1 View  Result Date: 06/25/2020 CLINICAL DATA:  60 year old male with chest pain and shortness of breath. EXAM: PORTABLE CHEST 1 VIEW COMPARISON:  Portable chest 03/08/2020 and earlier. FINDINGS: Portable AP upright view at 0504 hours. Improved lung volumes and ventilation compared to January. Normalized pulmonary vascularity. Visualized tracheal air column is within normal limits. No pneumothorax, pleural effusion or confluent pulmonary opacity. Stable mild cardiomegaly and other mediastinal contours. No acute osseous abnormality identified. Prior cervical ACDF. Paucity of bowel  gas in the upper abdomen. IMPRESSION: Resolved pulmonary edema since January. No acute cardiopulmonary abnormality. Electronically Signed   By: Genevie Ann M.D.   On: 06/25/2020 05:38   ECHOCARDIOGRAM COMPLETE  Result Date: 06/25/2020    ECHOCARDIOGRAM REPORT   Patient Name:   [name] Date of Exam: 06/25/2020 Medical Rec #:  [id number]     Height:       68.0 in Accession #:    [id number]    Weight:       229.0 lb Date of Birth:  11-23-1960      BSA:          2.165 m Patient Age:    72 years      BP:           170/107 mmHg Patient Gender: M             HR:           86 bpm. Exam Location:  Inpatient Procedure: 2D Echo, Cardiac Doppler, Color Doppler and Strain Analysis Indications:     R07.9* Chest pain, unspecified  History:        Patient has prior history of Echocardiogram examinations, most                 recent 12/11/2017. Stroke; Risk Factors:Hypertension.  Sonographer:    Merrie Roof Referring Phys: [id number] RONDELL A SMITH IMPRESSIONS  1. Left ventricular ejection fraction, by estimation, is 60 to 65%. The left ventricle has normal function. The left ventricle has no regional wall motion abnormalities. There is severe left ventricular hypertrophy. Left ventricular diastolic parameters  are consistent with Grade I diastolic dysfunction (impaired relaxation).  2. Right ventricular systolic function is normal. The right ventricular size is normal. There is moderately elevated pulmonary artery systolic pressure. The estimated right ventricular systolic pressure is 53.6 mmHg.  3. Left atrial size was moderately dilated.  4. A small pericardial effusion is present. The pericardial effusion is circumferential. There is no evidence of cardiac tamponade.  5. The mitral valve is normal in structure. Mild to moderate mitral valve regurgitation. No evidence of mitral stenosis.  6. The aortic valve is calcified. There is moderate thickening of the aortic valve. Aortic valve regurgitation is mild. Mild aortic valve stenosis. Aortic valve area, by VTI measures 1.26 cm. Aortic valve mean gradient measures 14.0 mmHg. Aortic valve Vmax measures 2.48 m/s.  7. The inferior vena cava is dilated in size with <50% respiratory variability, suggesting right atrial pressure of 15 mmHg.  8. There is a small patent foramen ovale. Comparison(s): No significant change from prior study. Prior images reviewed side by side. FINDINGS  Left Ventricle: Left ventricular ejection fraction, by estimation, is 60 to 65%. The left ventricle has normal function. The left ventricle has no regional wall motion abnormalities. Global longitudinal strain performed but not reported based on interpreter judgement due to suboptimal  tracking. The left ventricular internal cavity size was normal in size. There is severe left ventricular hypertrophy. Left ventricular diastolic parameters are consistent with Grade I diastolic dysfunction (impaired relaxation). Right Ventricle: The right ventricular size is normal. No increase in right ventricular wall thickness. Right ventricular systolic function is normal. There is moderately elevated pulmonary artery systolic pressure. The tricuspid regurgitant velocity is 3.06 m/s, and with an assumed right atrial pressure of 15 mmHg, the estimated right ventricular systolic pressure is 64.4 mmHg. Left Atrium: Left atrial size was moderately dilated. Right Atrium: Right atrial size was normal in  size. Pericardium: A small pericardial effusion is present. The pericardial effusion is circumferential. There is no evidence of cardiac tamponade. Mitral Valve: The mitral valve is normal in structure. Mild to moderate mitral valve regurgitation. No evidence of mitral valve stenosis. Tricuspid Valve: The tricuspid valve is normal in structure. Tricuspid valve regurgitation is mild . No evidence of tricuspid stenosis. Aortic Valve: The aortic valve is calcified. There is moderate thickening of the aortic valve. There is moderate aortic valve annular calcification. Aortic valve regurgitation is mild. Mild aortic stenosis is present. Aortic valve mean gradient measures 14.0 mmHg. Aortic valve peak gradient measures 24.5 mmHg. Aortic valve area, by VTI measures 1.26 cm. Pulmonic Valve: The pulmonic valve was normal in structure. Pulmonic valve regurgitation is not visualized. No evidence of pulmonic stenosis. Aorta: The aortic root is normal in size and structure. Venous: The inferior vena cava is dilated in size with less than 50% respiratory variability, suggesting right atrial pressure of 15 mmHg. IAS/Shunts: No atrial level shunt detected by color flow Doppler. A small patent foramen ovale is detected.  LEFT  VENTRICLE PLAX 2D LVIDd:         4.35 cm  Diastology LVIDs:         2.85 cm  LV e' medial:    4.03 cm/s LV PW:         1.45 cm  LV E/e' medial:  13.7 LV IVS:        1.40 cm  LV e' lateral:   6.42 cm/s LVOT diam:     2.10 cm  LV E/e' lateral: 8.6 LV SV:         57 LV SV Index:   27 LVOT Area:     3.46 cm  RIGHT VENTRICLE          IVC RV Basal diam:  3.70 cm  IVC diam: 2.50 cm LEFT ATRIUM              Index       RIGHT ATRIUM           Index LA diam:        4.65 cm  2.15 cm/m  RA Area:     24.30 cm LA Vol (A2C):   113.0 ml 52.20 ml/m RA Volume:   75.20 ml  34.74 ml/m LA Vol (A4C):   102.0 ml 47.12 ml/m LA Biplane Vol: 109.0 ml 50.35 ml/m  AORTIC VALVE AV Area (Vmax):    1.21 cm AV Area (Vmean):   1.13 cm AV Area (VTI):     1.26 cm AV Vmax:           247.50 cm/s AV Vmean:          171.500 cm/s AV VTI:            0.456 m AV Peak Grad:      24.5 mmHg AV Mean Grad:      14.0 mmHg LVOT Vmax:         86.50 cm/s LVOT Vmean:        56.100 cm/s LVOT VTI:          0.166 m LVOT/AV VTI ratio: 0.36  AORTA Ao Root diam: 3.10 cm Ao Asc diam:  3.60 cm MITRAL VALVE                TRICUSPID VALVE MV Area (PHT): 4.71 cm     TR Peak grad:   37.5 mmHg MV Decel Time: 161 msec  TR Vmax:        306.00 cm/s MV E velocity: 55.30 cm/s MV A velocity: 102.00 cm/s  SHUNTS MV E/A ratio:  0.54         Systemic VTI:  0.17 m                             Systemic Diam: 2.10 cm Candee Furbish MD Electronically signed by Candee Furbish MD Signature Date/Time: 06/25/2020/12:21:05 PM    Final       DVT prophylaxis: Lovenox  Code Status: Full code  Family Communication: No family at bedside   Consultants:    Procedures:      Objective    Physical Examination:    General-appears in no acute distress  Heart-S1-S2, regular, no murmur auscultated  Lungs-clear to auscultation bilaterally, no wheezing or crackles auscultated  Abdomen-soft, nontender, no organomegaly  Extremities-no edema in the lower  extremities  Neuro-alert, oriented x3, right-sided weakness   Status is: Inpatient  Dispo: The patient is from: Home              Anticipated d/c is to: Home              Anticipated d/c date is: 06/28/2020              Patient currently not stable for discharge  Barrier to discharge-ongoing evaluation for chest pain  COVID-19 Labs  No results for input(s): DDIMER, FERRITIN, LDH, CRP in the last 72 hours.  Lab Results  Component Value Date   SARSCOV2NAA NEGATIVE 06/25/2020   SARSCOV2NAA POSITIVE (A) 03/08/2020   [place] NEGATIVE 01/31/2020   [place] NEGATIVE 01/03/2020    Microbiology  Recent Results (from the past 240 hour(s))  Resp Panel by RT-PCR (Flu A&B, Covid) Nasopharyngeal Swab     Status: None   Collection Time: 06/25/20  8:32 AM   Specimen: Nasopharyngeal Swab; Nasopharyngeal(NP) swabs in vial transport medium  Result Value Ref Range Status   SARS Coronavirus 2 by RT PCR NEGATIVE NEGATIVE Final    Comment: (NOTE) SARS-CoV-2 target nucleic acids are NOT DETECTED.  The SARS-CoV-2 RNA is generally detectable in upper respiratory specimens during the acute phase of infection. The lowest concentration of SARS-CoV-2 viral copies this assay can detect is 138 copies/mL. A negative result does not preclude SARS-Cov-2 infection and should not be used as the sole basis for treatment or other patient management decisions. A negative result may occur with  improper specimen collection/handling, submission of specimen other than nasopharyngeal swab, presence of viral mutation(s) within the areas targeted by this assay, and inadequate number of viral copies(<138 copies/mL). A negative result must be combined with clinical observations, patient history, and epidemiological information. The expected result is Negative.  Fact Sheet for Patients:  EntrepreneurPulse.com.au  Fact Sheet for Healthcare Providers:   IncredibleEmployment.be  This test is no t yet approved or cleared by the [place] FDA and  has been authorized for detection and/or diagnosis of SARS-CoV-2 by FDA under an Emergency Use Authorization (EUA). This EUA will remain  in effect (meaning this test can be used) for the duration of the COVID-19 declaration under Section 564(b)(1) of the Act, 21 U.S.C.section 360bbb-3(b)(1), unless the authorization is terminated  or revoked sooner.       Influenza A by PCR NEGATIVE NEGATIVE Final   Influenza B by PCR NEGATIVE NEGATIVE Final    Comment: (NOTE) The Xpert Xpress SARS-CoV-2/FLU/RSV plus assay is intended  as an aid in the diagnosis of influenza from Nasopharyngeal swab specimens and should not be used as a sole basis for treatment. Nasal washings and aspirates are unacceptable for Xpert Xpress SARS-CoV-2/FLU/RSV testing.  Fact Sheet for Patients: EntrepreneurPulse.com.au  Fact Sheet for Healthcare Providers: IncredibleEmployment.be  This test is not yet approved or cleared by the [place] FDA and has been authorized for detection and/or diagnosis of SARS-CoV-2 by FDA under an Emergency Use Authorization (EUA). This EUA will remain in effect (meaning this test can be used) for the duration of the COVID-19 declaration under Section 564(b)(1) of the Act, 21 U.S.C. section 360bbb-3(b)(1), unless the authorization is terminated or revoked.  Performed at [facility], [place] [address], [place], [place] [address]          [facility] If 7PM-7AM, please contact night-coverage at [email or web address], Office  [phone]   06/26/2020, 6:38 PM  LOS: 0 days

## 2020-06-26 NOTE — NC FL2 (Addendum)
[place] LEVEL OF CARE SCREENING TOOL     IDENTIFICATION  Patient Name: [name] Birthdate: 05/27/1960 Sex: male Admission Date (Current Location): 06/25/2020  [facility] and [place] Number:  Herbalist and Address:  [facility] [place] [address], [place], [place] [address]      Provider Number: [id number]  Attending Physician Name and Address:  Oswald Hillock, MD  Relative Name and Phone Number:  [name] [phone]    Current Level of Care: Hospital Recommended Level of Care: Lane ([facility]) Prior Approval Number:    Date Approved/Denied:   PASRR Number:    Discharge Plan: Other (Comment) ([facility])    Current Diagnoses: Patient Active Problem List   Diagnosis Date Noted  . Chest pain 06/25/2020  . Pulmonary artery hypertension ([place]) 06/25/2020  . Chronic kidney disease, stage II (mild) 06/25/2020  . History of cerebrovascular accident (CVA) with residual deficit 06/25/2020  . Chronic kidney disease, stage 4 (severe) ([place]) 03/30/2020  . Morbid (severe) obesity due to excess calories ([place]) 03/30/2020  . Malnutrition of moderate degree 01/25/2020  . Palliative care by specialist   . Goals of care, counseling/discussion   . Nephrotic syndrome 01/06/2020  . Anasarca 01/06/2020  . Hypokalemia due to loss of potassium 01/04/2020  . Generalized body aches 01/01/2020  . Non-traumatic rhabdomyolysis 01/01/2020  . Grade I diastolic dysfunction 12/11/5100  . Microcytic anemia 01/01/2020  . Hypertensive urgency 12/31/2019  . Hypokalemia 12/31/2019  . Elevated CK   . Chronic diastolic CHF (congestive heart failure) ([place]) 12/31/2017  . Diabetes mellitus type 2, uncontrolled ([place])   . Pure hypercholesterolemia   . Hyperglycemia 09/28/2017  . Nonobstructive atherosclerosis of coronary artery 09/28/2017  . MDD (major depressive disorder), recurrent episode, severe ([place]) 07/30/2017  .  Osteoarthritis 04/12/2015  . Severe recurrent major depression with psychotic features ([place]) 01/04/2015  . Substance-related disorder ([place]) 10/30/2014  . Patient's noncompliance with other medical treatment and regimen 06/07/2014  . Cocaine dependence, uncomplicated ([place]) 58/52/7782  . Chronic pain 05/31/2014  . Cocaine abuse ([place]) 05/31/2014  . History of noncompliance with medical treatment 05/31/2014  . Malingering 05/31/2014  . Leukopenia 05/31/2014  . Diabetic neuropathy associated with diabetes mellitus due to underlying condition ([place]) 05/31/2014  . Diabetic neuropathy ([place]) 05/31/2014  . Congestive heart failure with left ventricular systolic dysfunction ([place]) 02/22/2014  . Depression, major, recurrent, severe with psychosis ([place]) 08/29/2013  . Suicidal ideations 08/28/2013  . Uncontrolled diabetes mellitus ([place]) 06/30/2013  . Atypical chest pain 12/09/2011  . Hypertension 12/09/2011  . Episodic mood disorder ([place]) 05/13/2011    Orientation RESPIRATION BLADDER Height & Weight     Self,Time,Situation,Place (WDL)  Normal Continent Weight: 229 lb (103.9 kg) Height:  5\' 8"  (172.7 cm)  BEHAVIORAL SYMPTOMS/MOOD NEUROLOGICAL BOWEL NUTRITION STATUS      Continent (WDL) Diet (See Discharge Summary)  AMBULATORY STATUS COMMUNICATION OF NEEDS Skin   Limited Assist Verbally Other (Comment) (Dry,Cracking,Leg,Foot,R,L)                       Personal Care Assistance Level of Assistance  Bathing,Feeding,Dressing Bathing Assistance: Limited assistance Feeding assistance: Independent (able to feed self) Dressing Assistance: Limited assistance     Functional Limitations Info  Sight,Hearing,Speech Sight Info: Adequate Hearing Info: Adequate Speech Info: Adequate    SPECIAL CARE FACTORS FREQUENCY  PT (By licensed PT),OT (By licensed OT)     PT Frequency:  3x min weekly with hh orders OT Frequency: 3x min weekly with hh orders             Contractures Contractures Info:  Not present    Additional Factors Info  Code Status,Allergies,Insulin Sliding Scale Code Status Info: FULL Allergies Info: Other Allergy to GRITS , unknown exactly the specific ingredient he is allergic too   Insulin Sliding Scale Info: insulin aspart (novoLOG) injection 0-15 Units 3 times daily with meals,insulin glargine (LANTUS) injection 20 Units daily at bedtime       Current Medications (06/26/2020):  This is the current hospital active medication list Current Facility-Administered Medications  Medication Dose Route Frequency Provider Last Rate Last Admin  . acetaminophen (TYLENOL) tablet 650 mg  650 mg Oral Q4H PRN Fuller Plan A, MD   650 mg at 06/26/20 1001  . amLODipine (NORVASC) tablet 10 mg  10 mg Oral Daily Tamala Julian, Rondell A, MD   10 mg at 06/26/20 6073  . atorvastatin (LIPITOR) tablet 20 mg  20 mg Oral Daily Fuller Plan A, MD   20 mg at 06/26/20 0813  . enoxaparin (LOVENOX) injection 40 mg  40 mg Subcutaneous Q24H Fuller Plan A, MD   40 mg at 06/26/20 7106  . gabapentin (NEURONTIN) capsule 100 mg  100 mg Oral TID Fuller Plan A, MD   100 mg at 06/26/20 2694  . hydrALAZINE (APRESOLINE) injection 10 mg  10 mg Intravenous Q4H PRN Fuller Plan A, MD   10 mg at 06/26/20 0404  . insulin aspart (novoLOG) injection 0-15 Units  0-15 Units Subcutaneous TID WC Fuller Plan A, MD   3 Units at 06/26/20 1245  . insulin glargine (LANTUS) injection 20 Units  20 Units Subcutaneous QHS Norval Morton, MD   20 Units at 06/25/20 2205  . ondansetron (ZOFRAN) injection 4 mg  4 mg Intravenous Q6H PRN Fuller Plan A, MD   4 mg at 06/26/20 1001  . tamsulosin (FLOMAX) capsule 0.4 mg  0.4 mg Oral Daily Fuller Plan A, MD   0.4 mg at 06/26/20 8546     Discharge Medications: Please see discharge summary for a list of discharge medications.  Relevant Imaging Results:  Relevant Lab Results:   Additional Information [id number]  Trula Ore, LCSWA

## 2020-06-26 NOTE — Evaluation (Signed)
Physical Therapy Evaluation Patient Details Name: [name] MRN: [id number] DOB: 09-Oct-1960 Today's Date: 06/26/2020   History of Present Illness  Pt is a 60 y.o. male admitted 06/25/20 with c/o SOB, chest pain. Workup for hypertensive urgency. CT with small bilateral pleural effusions, concern for pulmonary hypertension. PMH includes CVA 10/2019 (residual R-side weakness, mostly w/c-bound), obesity, DM2, CHF, CKD, arthritis, bipolar affective.    Clinical Impression  Pt presents with an overall decrease in functional mobility secondary to above. PTA, pt resides at [facility], reports mod indep with electric w/c, requires assist from staff for bathing. Today, pt able to perform bed mobility and standing with min guard; pt declined additional OOB mobility attempts, reports limited by significant SOB. DOE 3/4 with minimal activity, educ on pursed lip breathing. SpO2 90-96% on RA, HR 92, BP 134/82. Pt would benefit from continued acute PT services to maximize functional mobility and independence prior to d/c with HHPT services at ALF.     Follow Up Recommendations Home health PT;Supervision - Intermittent (at ILF/ALF)    Equipment Recommendations  None recommended by PT    Recommendations for Other Services       Precautions / Restrictions Precautions Precautions: Fall;Other (comment) Precaution Comments: H/o CVA (residual R-side weakness) Restrictions Weight Bearing Restrictions: No      Mobility  Bed Mobility Overal bed mobility: Needs Assistance Bed Mobility: Supine to Sit;Sit to Supine     Supine to sit: Modified independent (Device/Increase time);HOB elevated Sit to supine: Min assist   General bed mobility comments: Significant increased time and effort to come to sitting EOB, reliant on HOB elevated, momentum and use of bed rail, prolonged time to recover SOB once seated; MinA for BLE management return to supine    Transfers Overall transfer level: Needs  assistance Equipment used: Straight cane Transfers: Sit to/from Stand Sit to Stand: Min guard         General transfer comment: Increased time and effort to stand, heavy reliance on BUE support to push into standing with SPC and other UE support on bed rail; able to stand upright (almost fully), but reliant on UE support on bed rail, declined use of RW or HHA  Ambulation/Gait Ambulation/Gait assistance: Min guard Gait Distance (Feet): 1 Feet Assistive device: Straight cane Gait Pattern/deviations: Step-to pattern;Trunk flexed;Antalgic;Decreased weight shift to right;Decreased dorsiflexion - right Gait velocity: Decreased   General Gait Details: Side steps/scoots towards HOB with SPC and min guard, reliant on UE support on bed rail, declined use of RW or HHA; pt taking scooting steps, did not take complete step with RLE  Stairs            Wheelchair Mobility    Modified Rankin (Stroke Patients Only)       Balance Overall balance assessment: Needs assistance   Sitting balance-Leahy Scale: Fair Sitting balance - Comments: Prolonged sitting EOB, able to alternate between upright posture and resting elbows on thighs secondary to SOB   Standing balance support: Bilateral upper extremity supported;During functional activity Standing balance-Leahy Scale: Poor Standing balance comment: Reliant on UE support                             Pertinent Vitals/Pain Pain Assessment: Faces Faces Pain Scale: Hurts little more Pain Location: "Something is always hurting" - specifically lower back Pain Descriptors / Indicators: Discomfort Pain Intervention(s): Monitored during session    Home Living Family/patient expects to be discharged  to:: Assisted living               Home Equipment: Gilford Rile - 4 wheels;Cane - single point;Wheelchair - power;Shower seat Additional Comments: Resides at [facility]. Accessible room with walk-in shower    Prior Function Level of  Independence: Needs assistance   Gait / Transfers Assistance Needed: Reports mod indep with transfers to/from power w/c  ADL's / Homemaking Assistance Needed: Reports mod indep with ADLs, except needs assist from staff for bathing; able to transfer himself from w/c to shower bench        Hand Dominance        Extremity/Trunk Assessment   Upper Extremity Assessment Upper Extremity Assessment: Generalized weakness    Lower Extremity Assessment Lower Extremity Assessment: Generalized weakness       Communication   Communication: No difficulties  Cognition Arousal/Alertness: Awake/alert Behavior During Therapy: WFL for tasks assessed/performed Overall Cognitive Status: Within Functional Limits for tasks assessed                                 General Comments: WFL for simple tasks, not formally assessed; adamant about completing tasks himself, resistant to assist, difficult to reason with (reports "I'm too short of breath to answer your questions" but then offering info regarding PMH/PSH, etc... "Last time I was here they removed 2-3 vertebraes right here (points to neck) that was wrapped around my spleen")      General Comments General comments (skin integrity, edema, etc.): Pt with DOE 3/4 just coming to sit EOB, SpO2 down to 90% on RA, educ on pursed lip breathing; HR 92; c/o headache and nausea sitting, BP 134/82    Exercises     Assessment/Plan    PT Assessment Patient needs continued PT services  PT Problem List Decreased strength;Decreased activity tolerance;Decreased balance;Decreased mobility;Cardiopulmonary status limiting activity       PT Treatment Interventions DME instruction;Gait training;Functional mobility training;Therapeutic activities;Therapeutic exercise;Balance training;Patient/family education;Wheelchair mobility training    PT Goals (Current goals can be found in the Care Plan section)  Acute Rehab PT Goals Patient Stated Goal:  Hopeful to start PT/OT services at ALF PT Goal Formulation: With patient Time For Goal Achievement: 07/10/20 Potential to Achieve Goals: Good    Frequency Min 3X/week   Barriers to discharge        Co-evaluation               AM-PAC PT "6 Clicks" Mobility  Outcome Measure Help needed turning from your back to your side while in a flat bed without using bedrails?: A Little Help needed moving from lying on your back to sitting on the side of a flat bed without using bedrails?: A Little Help needed moving to and from a bed to a chair (including a wheelchair)?: A Little Help needed standing up from a chair using your arms (e.g., wheelchair or bedside chair)?: A Little Help needed to walk in hospital room?: A Lot Help needed climbing 3-5 steps with a railing? : A Lot 6 Click Score: 16    End of Session   Activity Tolerance: Patient limited by fatigue;Other (comment) (Limited by SOB) Patient left: in bed;with call bell/phone within reach;with bed alarm set Nurse Communication: Mobility status;Other (comment) (request anti-nausea meds) PT Visit Diagnosis: Other abnormalities of gait and mobility (R26.89);Muscle weakness (generalized) (M62.81)    Time: 8242-3536 PT Time Calculation (min) (ACUTE ONLY): 34 min   Charges:  PT Evaluation $PT Eval Moderate Complexity: 1 Mod PT Treatments $Therapeutic Activity: 8-22 mins   Mabeline Caras, PT, DPT Acute Rehabilitation Services  Pager [phone] Office [place] 06/26/2020, 12:09 PM

## 2020-06-26 NOTE — Care Management Obs Status (Signed)
[facility] NOTIFICATION   Patient Details  Name: [name] MRN: [id number] Date of Birth: 1960/05/13   Medicare Observation Status Notification Given:  Yes    Bethena Roys, RN 06/26/2020, 5:47 PM

## 2020-06-26 NOTE — Consult Note (Signed)
CARDIOLOGY CONSULT NOTE  Patient ID: [name] MRN: [id number] DOB/AGE: 1960/05/19 60 y.o.  Admit date: 06/25/2020 Referring Physician  Oswald Hillock, MD Primary Physician:  [name], NP Reason for Consultation  Chest pain   Patient ID: [name], male    DOB: [birth year], 60 y.o.   MRN: [id number]  Chief Complaint  Patient presents with  . Chest Pain   HPI:    [name]  is a 60 y.o. African-American male with history of hypertension, family history of premature CAD, hyperlipidemia, diastolic heart failure, type 2 diabetes mellitus uncontrolled, history of CVA with residual right-sided weakness, history of polysubstance abuse.  Who presented to [facility] emergency department with complaints of chest pain and dyspnea yesterday.  He is a resident at [facility].  Patient reports worsening shortness of breath and substernal chest pain worse with exertion.  Work-up in the emergency department revealed mildly elevated troponin, but flat, mildly elevated BNP at 168.  Patient also presented significantly hypertensive.  Chest CT revealed patchy groundglass opacities, and bilateral small pleural effusions, however chest x-ray revealed no effusion.  Repeat echocardiogram revealed normal LVEF with severe LVH and diastolic dysfunction.  Lipids are uncontrolled and A1c is 9.1%.  Time of exam patient reports chest pain has significantly improved, however he continues to have significant dyspnea even with movement in the bed.  Blood pressure control has improved, however remains elevated.  Past Medical History:  Diagnosis Date  . Arthritis   . Bipolar affective ([place])   . CHF (congestive heart failure) ([place])   . Depression   . Diabetes mellitus    uncontrolled   . Hypertension    Past Surgical History:  Procedure Laterality Date  . BOWEL DECOMPRESSION N/A 01/19/2020   Procedure: BOWEL DECOMPRESSION;  Surgeon: Otis Brace, MD;  Location: [place];  Service: Gastroenterology;  Laterality: N/A;  . COLONOSCOPY WITH PROPOFOL N/A 01/19/2020   Procedure: COLONOSCOPY WITH PROPOFOL;  Surgeon: Otis Brace, MD;  Location: [place];  Service: Gastroenterology;  Laterality: N/A;  . KNEE SURGERY     b/l knees; pending total knee    Family History  Problem Relation Age of Onset  . Diabetes Other   . Cancer Mother        uterus; deceased   . Heart attack Mother 35  . Hypertension Other   . Heart attack Father 65  . Stroke Sister        12/2012 age 59 y.o   Social History   Tobacco Use  . Smoking status: Former Research scientist (life sciences)  . Smokeless tobacco: Never Used  Substance Use Topics  . Alcohol use: No    Marital Sttus: Single  ROS  Review of Systems  Constitutional: Positive for malaise/fatigue. Negative for weight gain.  Cardiovascular: Positive for chest pain, dyspnea on exertion and leg swelling. Negative for claudication, near-syncope, orthopnea, palpitations, paroxysmal nocturnal dyspnea and syncope.  Hematologic/Lymphatic: Does not bruise/bleed easily.  Gastrointestinal: Negative for melena.  Neurological: Negative for dizziness and weakness.       Residual right-sided weakness secondary to previous CVA.  All other systems reviewed and are negative.  Objective   Vitals with BMI 06/26/2020 06/26/2020 06/26/2020  Height - - -  Weight - - -  BMI - - -  Systolic 546 503 546  Diastolic 76 94 78  Pulse 88 - -  Some encounter information is confidential and restricted. Go to Review Flowsheets activity to see all data.  Blood pressure 128/76, pulse 88, temperature 98.5 F (36.9 C), temperature source Oral, resp. rate 18, height 5\' 8"  (1.727 m), weight 103.9 kg, SpO2 91 %.    Physical Exam Vitals and nursing note reviewed.  Constitutional:      General: He is not in acute distress.    Appearance: He is obese.  HENT:     Head: Normocephalic and atraumatic.     Right Ear: External ear normal.     Left Ear: External  ear normal.     Nose: Nose normal.     Mouth/Throat:     Mouth: Mucous membranes are moist.  Eyes:     Extraocular Movements: Extraocular movements intact.     Conjunctiva/sclera: Conjunctivae normal.  Neck:     Vascular: No carotid bruit.  Cardiovascular:     Rate and Rhythm: Normal rate and regular rhythm.     Pulses: Intact distal pulses.          Carotid pulses are 2+ on the right side and 2+ on the left side.      Radial pulses are 2+ on the right side and 2+ on the left side.       Dorsalis pedis pulses are 0 on the right side and 0 on the left side.       Posterior tibial pulses are 0 on the right side and 0 on the left side.     Heart sounds: S1 normal and S2 normal. Murmur heard.   Harsh midsystolic murmur is present with a grade of 2/6 at the upper right sternal border.  Blowing holosystolic murmur of grade 2/6 is also present at the apex. No gallop.   Pulmonary:     Effort: Respiratory distress (mild respirtory distress when moviing in bed during exam) present.     Breath sounds: No wheezing, rhonchi or rales.  Abdominal:     General: Bowel sounds are normal. There is no distension.     Palpations: Abdomen is soft.  Musculoskeletal:     Right lower leg: Edema (2+ pitting below knee ) present.     Left lower leg: Edema (2+ pitting below knee) present.  Skin:    General: Skin is dry.     Comments: Chronic venous stasis changes bilateral lower legs  Neurological:     Mental Status: He is alert and oriented to person, place, and time. Mental status is at baseline.     Comments: Residual right-sided weakness secondary to previous CVA.  Psychiatric:        Mood and Affect: Mood normal.        Behavior: Behavior normal.    Laboratory examination:   Recent Labs    03/08/20 2010 05/11/20 1154 06/25/20 0456  NA 134* 134* 133*  K 3.8 4.3 4.0  CL 105 106 103  CO2 20* 22 22  GLUCOSE 183* 244* 215*  BUN 16 21* 11  CREATININE 1.23 1.31* 1.36*  CALCIUM 8.0* 8.5* 8.3*   GFRNONAA >60 >60 >60   estimated creatinine clearance is 69.2 mL/min (A) (by C-G formula based on SCr of 1.36 mg/dL (H)).  CMP Latest Ref Rng & Units 06/25/2020 05/11/2020 03/08/2020  Glucose 70 - 99 mg/dL 215(H) 244(H) 183(H)  BUN 6 - 20 mg/dL 11 21(H) 16  Creatinine 0.61 - 1.24 mg/dL 1.36(H) 1.31(H) 1.23  Sodium 135 - 145 mmol/L 133(L) 134(L) 134(L)  Potassium 3.5 - 5.1 mmol/L 4.0 4.3 3.8  Chloride 98 - 111 mmol/L 103 106 105  CO2  22 - 32 mmol/L 22 22 20(L)  Calcium 8.9 - 10.3 mg/dL 8.3(L) 8.5(L) 8.0(L)  Total Protein 6.5 - 8.1 g/dL 6.0(L) - 6.4(L)  Total Bilirubin 0.3 - 1.2 mg/dL 0.6 - 0.4  Alkaline Phos 38 - 126 U/L 102 - 112  AST 15 - 41 U/L 20 - 17  ALT 0 - 44 U/L 13 - 12   CBC Latest Ref Rng & Units 06/25/2020 05/11/2020 03/08/2020  WBC 4.0 - 10.5 K/uL 2.9(L) 3.4(L) 4.3  Hemoglobin 13.0 - 17.0 g/dL 11.0(L) 9.7(L) 9.0(L)  Hematocrit 39.0 - 52.0 % 35.0(L) 31.4(L) 29.2(L)  Platelets 150 - 400 K/uL 218 273 270   Lipid Panel Recent Labs    01/26/20 0205 06/25/20 0711  CHOL  --  305*  TRIG 71 90  LDLCALC  --  224*  VLDL  --  18  HDL  --  63  CHOLHDL  --  4.8    HEMOGLOBIN A1C Lab Results  Component Value Date   HGBA1C 9.1 (H) 06/25/2020   MPG 214.47 06/25/2020   TSH Recent Labs    01/01/20 0849 01/18/20 0517 06/25/20 1244  TSH 1.354 2.317 1.714   BNP (last 3 results) Recent Labs    12/25/19 1638 12/31/19 2122 06/25/20 0456  BNP 63.9 116.8* 168.5*   Results for orders placed or performed during the hospital encounter of 06/25/20 (from the past 48 hour(s))  Comprehensive metabolic panel     Status: Abnormal   Collection Time: 06/25/20  4:56 AM  Result Value Ref Range   Sodium 133 (L) 135 - 145 mmol/L   Potassium 4.0 3.5 - 5.1 mmol/L   Chloride 103 98 - 111 mmol/L   CO2 22 22 - 32 mmol/L   Glucose, Bld 215 (H) 70 - 99 mg/dL    Comment: Glucose reference range applies only to samples taken after fasting for at least 8 hours.   BUN 11 6 - 20 mg/dL    Creatinine, Ser 1.36 (H) 0.61 - 1.24 mg/dL   Calcium 8.3 (L) 8.9 - 10.3 mg/dL   Total Protein 6.0 (L) 6.5 - 8.1 g/dL   Albumin 1.9 (L) 3.5 - 5.0 g/dL   AST 20 15 - 41 U/L   ALT 13 0 - 44 U/L   Alkaline Phosphatase 102 38 - 126 U/L   Total Bilirubin 0.6 0.3 - 1.2 mg/dL   GFR, Estimated >60 >60 mL/min    Comment: (NOTE) Calculated using the CKD-EPI Creatinine Equation (2021)    Anion gap 8 5 - 15    Comment: Performed at [facility], [place] [address] [place], [place] [address]  Troponin I (High Sensitivity)     Status: Abnormal   Collection Time: 06/25/20  4:56 AM  Result Value Ref Range   Troponin I (High Sensitivity) 25 (H) <18 ng/L    Comment: (NOTE) Elevated high sensitivity troponin I (hsTnI) values and significant  changes across serial measurements may suggest ACS but many other  chronic and acute conditions are known to elevate hsTnI results.  Refer to the "Links" section for chest pain algorithms and additional  guidance. Performed at [facility], [place] [address], [place], [place] [address]   Brain natriuretic peptide     Status: Abnormal   Collection Time: 06/25/20  4:56 AM  Result Value Ref Range   B Natriuretic Peptide 168.5 (H) 0.0 - 100.0 pg/mL    Comment: Performed at [place] [address], [place], [place] [address]  Lipase, blood     Status: None   Collection Time: 06/25/20  4:56 AM  Result Value Ref Range   Lipase 30 11 - 51 U/L    Comment: Performed at [facility], [place] [address] [place], [place] [address]  CBC with Differential/Platelet     Status: Abnormal   Collection Time: 06/25/20  5:38 AM  Result Value Ref Range   WBC 2.9 (L) 4.0 - 10.5 K/uL   RBC 4.46 4.22 - 5.81 MIL/uL   Hemoglobin 11.0 (L) 13.0 - 17.0 g/dL   HCT 35.0 (L) 39.0 - 52.0 %   MCV 78.5 (L) 80.0 - 100.0 fL   MCH 24.7 (L) 26.0 - 34.0 pg   MCHC 31.4 30.0 - 36.0 g/dL   RDW 13.5 11.5 - 15.5 %   Platelets 218 150 - 400 K/uL   nRBC 0.0 0.0 - 0.2 %    Neutrophils Relative % 48 %   Neutro Abs 1.4 (L) 1.7 - 7.7 K/uL   Lymphocytes Relative 32 %   Lymphs Abs 0.9 0.7 - 4.0 K/uL   Monocytes Relative 14 %   Monocytes Absolute 0.4 0.1 - 1.0 K/uL   Eosinophils Relative 4 %   Eosinophils Absolute 0.1 0.0 - 0.5 K/uL   Basophils Relative 1 %   Basophils Absolute 0.0 0.0 - 0.1 K/uL   Immature Granulocytes 1 %   Abs Immature Granulocytes 0.02 0.00 - 0.07 K/uL    Comment: Performed at [facility], [place] [address], [place], [place] [address]  Troponin I (High Sensitivity)     Status: Abnormal   Collection Time: 06/25/20  6:56 AM  Result Value Ref Range   Troponin I (High Sensitivity) 25 (H) <18 ng/L    Comment: (NOTE) Elevated high sensitivity troponin I (hsTnI) values and significant  changes across serial measurements may suggest ACS but many other  chronic and acute conditions are known to elevate hsTnI results.  Refer to the "Links" section for chest pain algorithms and additional  guidance. Performed at [facility], [place] [address], [place], [place] [address]   Lipid panel     Status: Abnormal   Collection Time: 06/25/20  7:11 AM  Result Value Ref Range   Cholesterol 305 (H) 0 - 200 mg/dL   Triglycerides 90 <150 mg/dL   HDL 63 >40 mg/dL   Total CHOL/HDL Ratio 4.8 RATIO   VLDL 18 0 - 40 mg/dL   LDL Cholesterol 224 (H) 0 - 99 mg/dL    Comment:        Total Cholesterol/HDL:CHD Risk Coronary Heart Disease Risk Table                     Men   Women  1/2 Average Risk   3.4   3.3  Average Risk       5.0   4.4  2 X Average Risk   9.6   7.1  3 X Average Risk  23.4   11.0        Use the calculated Patient Ratio above and the CHD Risk Table to determine the patient's CHD Risk.        ATP III CLASSIFICATION (LDL):  <100     mg/dL   Optimal  100-129  mg/dL   Near or Above                    Optimal  130-159  mg/dL   Borderline  160-189  mg/dL   High  >190     mg/dL   Very High Performed at [facility] [address] [place], [place] [address]   Resp Panel by RT-PCR (Flu A&B, Covid) Nasopharyngeal Swab     Status: None   Collection Time: 06/25/20  8:32 AM   Specimen: Nasopharyngeal Swab; Nasopharyngeal(NP) swabs in vial transport medium  Result Value Ref Range   SARS Coronavirus 2 by RT PCR NEGATIVE NEGATIVE    Comment: (NOTE) SARS-CoV-2 target nucleic acids are NOT DETECTED.  The SARS-CoV-2 RNA is generally detectable in upper respiratory specimens during the acute phase of infection. The lowest concentration of SARS-CoV-2 viral copies this assay can detect is 138 copies/mL. A negative result does not preclude SARS-Cov-2 infection and should not be used as the sole basis for treatment or other patient management decisions. A negative result may occur with  improper specimen collection/handling, submission of specimen other than nasopharyngeal swab, presence of viral mutation(s) within the areas targeted by this assay, and inadequate number of viral copies(<138 copies/mL). A negative result must be combined with clinical observations, patient history, and epidemiological information. The expected result is Negative.  Fact Sheet for Patients:  EntrepreneurPulse.com.au  Fact Sheet for Healthcare Providers:  IncredibleEmployment.be  This test is no t yet approved or cleared by the [place] FDA and  has been authorized for detection and/or diagnosis of SARS-CoV-2 by FDA under an Emergency Use Authorization (EUA). This EUA will remain  in effect (meaning this test can be used) for the duration of the COVID-19 declaration under Section 564(b)(1) of the Act, 21 U.S.C.section 360bbb-3(b)(1), unless the authorization is terminated  or revoked sooner.       Influenza A by PCR NEGATIVE NEGATIVE   Influenza B by PCR NEGATIVE NEGATIVE    Comment: (NOTE) The Xpert Xpress SARS-CoV-2/FLU/RSV plus assay is intended as an aid in the diagnosis of influenza  from Nasopharyngeal swab specimens and should not be used as a sole basis for treatment. Nasal washings and aspirates are unacceptable for Xpert Xpress SARS-CoV-2/FLU/RSV testing.  Fact Sheet for Patients: EntrepreneurPulse.com.au  Fact Sheet for Healthcare Providers: IncredibleEmployment.be  This test is not yet approved or cleared by the [place] FDA and has been authorized for detection and/or diagnosis of SARS-CoV-2 by FDA under an Emergency Use Authorization (EUA). This EUA will remain in effect (meaning this test can be used) for the duration of the COVID-19 declaration under Section 564(b)(1) of the Act, 21 U.S.C. section 360bbb-3(b)(1), unless the authorization is terminated or revoked.  Performed at [facility], [place] [address], [place], [place] [address]   TSH     Status: None   Collection Time: 06/25/20 12:44 PM  Result Value Ref Range   TSH 1.714 0.350 - 4.500 uIU/mL    Comment: Performed by a 3rd Generation assay with a functional sensitivity of <=0.01 uIU/mL. Performed at [facility], [place] [address], [place], [place] [address]   Hemoglobin A1c     Status: Abnormal   Collection Time: 06/25/20  1:14 PM  Result Value Ref Range   Hgb A1c MFr Bld 9.1 (H) 4.8 - 5.6 %    Comment: (NOTE) Pre diabetes:          5.7%-6.4%  Diabetes:              >6.4%  Glycemic control for   <7.0% adults with diabetes    Mean Plasma Glucose 214.47 mg/dL  Comment: Performed at [facility], [place] [address], [place], [place] [address]  Glucose, capillary     Status: Abnormal   Collection Time: 06/25/20  1:25 PM  Result Value Ref Range   Glucose-Capillary 447 (H) 70 - 99 mg/dL    Comment: Glucose reference range applies only to samples taken after fasting for at least 8 hours.  Glucose, capillary     Status: Abnormal   Collection Time: 06/25/20  4:25 PM  Result Value Ref Range   Glucose-Capillary 175 (H) 70 - 99 mg/dL     Comment: Glucose reference range applies only to samples taken after fasting for at least 8 hours.  Urine rapid drug screen (hosp performed)     Status: Abnormal   Collection Time: 06/25/20  6:32 PM  Result Value Ref Range   Opiates POSITIVE (A) NONE DETECTED   Cocaine NONE DETECTED NONE DETECTED   Benzodiazepines NONE DETECTED NONE DETECTED   Amphetamines NONE DETECTED NONE DETECTED   Tetrahydrocannabinol NONE DETECTED NONE DETECTED   Barbiturates NONE DETECTED NONE DETECTED    Comment: (NOTE) DRUG SCREEN FOR MEDICAL PURPOSES ONLY.  IF CONFIRMATION IS NEEDED FOR ANY PURPOSE, NOTIFY LAB WITHIN 5 DAYS.  LOWEST DETECTABLE LIMITS FOR URINE DRUG SCREEN Drug Class                     Cutoff (ng/mL) Amphetamine and metabolites    1000 Barbiturate and metabolites    200 Benzodiazepine                 102 Tricyclics and metabolites     300 Opiates and metabolites        300 Cocaine and metabolites        300 THC                            50 Performed at [facility], [place] [address] [place], [place] [address]   Glucose, capillary     Status: Abnormal   Collection Time: 06/25/20  9:04 PM  Result Value Ref Range   Glucose-Capillary 160 (H) 70 - 99 mg/dL    Comment: Glucose reference range applies only to samples taken after fasting for at least 8 hours.  Glucose, capillary     Status: Abnormal   Collection Time: 06/26/20  7:31 AM  Result Value Ref Range   Glucose-Capillary 136 (H) 70 - 99 mg/dL    Comment: Glucose reference range applies only to samples taken after fasting for at least 8 hours.    Medications and allergies   Allergies  Allergen Reactions  . Other Anaphylaxis, Itching and Swelling    Allergy to GRITS , unknown exactly the specific ingredient he is allergic too     No outpatient medications have been marked as taking for the 06/25/20 encounter [facility] Encounter).    Scheduled Meds: . amLODipine  10 mg Oral Daily  . atorvastatin  20 mg Oral Daily  .  enoxaparin (LOVENOX) injection  40 mg Subcutaneous Q24H  . gabapentin  100 mg Oral TID  . insulin aspart  0-15 Units Subcutaneous TID WC  . insulin glargine  20 Units Subcutaneous QHS  . tamsulosin  0.4 mg Oral Daily   Continuous Infusions: PRN Meds:.acetaminophen, hydrALAZINE, ondansetron (ZOFRAN) IV   I/O last 3 completed shifts: In: -  Out: 1500 [Urine:1500] Total I/O In: 400 [P.O.:400] Out: 325 [Urine:325]    Radiology:  DG Abd 1 View 06/26/2020 FINDINGS: Mild gaseous distention of the stomach. No small bowel dilatation. Large stool burden in the ascending and transverse colon noted. No unexpected abdominal calcification. IMPRESSION: Mild gaseous distention of the stomach is likely incidental. Large stool burden ascending and transverse colon.   CT HEAD WO CONTRAST 06/25/2020  Brain: Small new hypodensity in the left caudate on series 2, image 15. Subtle dystrophic bilateral basal ganglia calcifications. Generalized cerebral volume loss since 2019. No midline shift, ventriculomegaly, mass effect, evidence of mass lesion, intracranial hemorrhage or evidence of cortically based acute infarction. Outside of the left caudate gray-white matter differentiation appears stable and within normal limits. Vascular: Calcified atherosclerosis at the skull base. No suspicious intracranial vascular hyperdensity. Skull: No acute osseous abnormality identified. Sinuses/Orbits: Visualized paranasal sinuses and mastoids are stable and well aerated. Other: There is a broad-based right vertex scalp fluid collection most resembling hematoma on series 3, image 67. Underlying calvarium appears stable and intact. Otherwise stable and negative visible orbit and scalp soft tissues.  1. Right vertex scalp abnormality most resembling a small scalp hematoma. Underlying calvarium intact.  2. Age indeterminate lacunar infarct of the left caudate nucleus, new since 2019. Query right side weakness.  3. No other acute  intracranial abnormality identified.  CT ANGIO CHEST PE W OR WO CONTRAST 06/25/2020 1. No CT evidence for acute pulmonary embolus.  2. Enlargement of the pulmonary outflow tract and main pulmonary arteries suggests pulmonary arterial hypertension.  3. Patchy ground-glass opacity in the upper lungs bilaterally with dependent ground-glass airspace disease in the lower lobes, right greater than left. Basilar findings may reflect atelectasis although superimposed infectious/inflammatory etiology not excluded.  4. Small bilateral pleural effusions.  5. Bilateral thyroid nodules measuring up to 2.6 cm on the right. Recommend thyroid US (ref: J Am Coll Radiol. 2015 Feb;12(2): 143-50).  6. Cholelithiasis.  7. Aortic Atherosclerosis (ICD10-I70.0).  DG Chest Port 1 View 06/25/2020  Improved lung volumes and ventilation compared to January. Normalized pulmonary vascularity. Visualized tracheal air column is within normal limits. No pneumothorax, pleural effusion or confluent pulmonary opacity. Stable mild cardiomegaly and other mediastinal contours. No acute osseous abnormality identified. Prior cervical ACDF. Paucity of bowel gas in the upper abdomen.  IMPRESSION: Resolved pulmonary edema since January. No acute cardiopulmonary abnormality.  Cardiac Studies:   ECHOCARDIOGRAM COMPLETE 06/25/2020 1. Left ventricular ejection fraction, by estimation, is 60 to 65%. The left ventricle has normal function. The left ventricle has no regional wall motion abnormalities. There is severe left ventricular hypertrophy. Left ventricular diastolic parameters  are consistent with Grade I diastolic dysfunction (impaired relaxation).   2. Right ventricular systolic function is normal. The right ventricular size is normal. There is moderately elevated pulmonary artery systolic pressure. The estimated right ventricular systolic pressure is 24.0 mmHg.   3. Left atrial size was moderately dilated.   4. A small pericardial effusion  is present. The pericardial effusion is circumferential. There is no evidence of cardiac tamponade.   5. The mitral valve is normal in structure. Mild to moderate mitral valve regurgitation. No evidence of mitral stenosis.   6. The aortic valve is calcified. There is moderate thickening of the aortic valve. Aortic valve regurgitation is mild. Mild aortic valve stenosis. Aortic valve area, by VTI measures 1.26 cm. Aortic valve mean gradient measures 14.0 mmHg. Aortic valve Vmax measures 2.48 m/s.   7. The inferior vena cava is dilated in size with <50% respiratory variability, suggesting right atrial pressure of 15 mmHg.  8. There is a small patent foramen ovale. Comparison(s): No significant change from prior study. Prior images reviewed side by side.  Left heart catheterization 06/30/2019 ([facility]):  Left main is patent.  LAD has mild diffuse irregularity. Diagonal and septal branches are  patent. The left circumflex is a dominant vessel and has mild diffuse  irregularity. Obtuse marginal 1 is a small caliber branch and has  subtotal occlusion. Second obtuse marginal and PLV branches are patent.  Right coronary artery is small and nondominant. It is patent.  Left milligrams performed revealed preserved LV systolic function  left-ventricular ejection fraction 65%.   Plan: Medical therapy for very small caliber subtotal occlusion of OM1 branch.  Aspirin, statin and ACE inhibitor's. Beta-blockers not used due to  history of cocaine use.  Aggressive control of his blood pressure and diabetes.  Rehab for cocaine use.  Telemetry:  Normal sinus rhythm  EKG: 06/25/2020: Sinus rhythm at a rate of 94 bpm.  Normal axis.  Nonspecific T wave abnormality lateral leads.  Assessment   [name]  is a 60 y.o. African-American male with history of hypertension, family history of premature CAD, hyperlipidemia, diastolic heart failure, type 2 diabetes mellitus uncontrolled, history of CVA  with residual right-sided weakness, history of polysubstance abuse.  Who presented to [facility] emergency department with complaints of chest pain and dyspnea yesterday.  He is a resident at [facility].  Acute on chronic diastolic heart failure - hypertensive cardiomyopathy    Precordial pain Elevated troponin  Hypertension  Pulmonary hypertension, WHO group 2 Hyperlipidemia Diabetes mellitus type II, uncontrolled Chronic kidney disease, stage II History of CVA, September 2021    Recommendations:   Acute on chronic diastolic heart failure Suspect acute on chronic diastolic heart failure to be etiology of patient's precordial pain as well as dyspnea.  On exam and BNP is elevated at 168.  Repeat echocardiogram cardiogram reveals LV EF 60-65-65% with severe left ventricular hypertrophy and grade 1 diastolic dysfunction.  We will start patient on Lasix 40 mg IV twice daily and trend BMP.  We will also recommend initiation of guideline directed medical therapy as renal function allows.  Suspect underlying hypertensive cardiomyopathy given patient's significantly elevated blood pressure which remains uncontrolled.   Precordial pain/dyspnea on exertion Given patient's symptoms as well as mildly elevated troponin will obtain inpatient nuclear stress test.  Although suspect troponin elevation to be due to acute exacerbation of chronic diastolic heart failure.  Review of previous left heart catheterization does reveal subtotal occlusion of OM1 has mild diffuse disease.  Significant coronary artery disease may be contributing to underlying heart failure.  Elevated troponin  Management/reccomendation per above.   Hypertension  Blood pressure control has not improved significantly since admission.  We will continue amlodipine and as needed hydralazine.  Suspect hypertension to be underlying driving factor diastolic heart failure likely also contributing to present  acute exacerbation.  Plan to initiate and titrate guideline directed medical therapy for heart failure with preserved ejection fraction, which will also improve hypertension control.  Pulmonary hypertension, WHO group 2 Management per above.  Hyperlipidemia Continue atorvastatin.  However lipids remain uncontrolled.  Could consider increased dose of atorvastatin versus addition of Zetia.  In view of diabetes and known coronary artery disease would recommend LDL goal of <70.  Diabetes mellitus type II, uncontrolled Management per primary team.  Patient regarding importance of diabetes control in order to reduce cardiovascular risk.  Chronic kidney disease, stage  II Management per primary team.  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  Management per primary team.  Will continue to follow, thank you for involving [place] in this patient's care.   Patient was seen in collaboration with Dr. Virgina Jock. He also reviewed patient's chart and examined the patient. Dr. Virgina Jock is in agreement of the plan.     Alethia Berthold, PA-C 06/26/2020, 12:07 PM Office: [phone]

## 2020-06-26 NOTE — Progress Notes (Signed)
Pt was on list of consults to be seen but per chart review, Dr. Virgina Jock had seen in 2019. Have alerted IM team to reach out to their team if cardiology consult needed. We will remove from our list but please call if needed.

## 2020-06-26 NOTE — Plan of Care (Signed)

## 2020-06-26 NOTE — Progress Notes (Signed)
Inpatient Diabetes Program Recommendations  AACE/ADA: New Consensus Statement on Inpatient Glycemic Control (2015)  Target Ranges:  Prepandial:   less than 140 mg/dL      Peak postprandial:   less than 180 mg/dL (1-2 hours)      Critically ill patients:  140 - 180 mg/dL   Lab Results  Component Value Date   GLUCAP 154 (H) 06/26/2020   HGBA1C 9.1 (H) 06/25/2020    Review of Glycemic Control  Diabetes history: DM 2 Outpatient Diabetes medications: metformin 1000 mg bid, lantus 20 Current orders for Inpatient glycemic control: lantus 20, nov 0-15 units tid  Spoke with pt regarding A1c and glucose control at home. Pt reports he is confined to a motorized wheelchair and can get his own meals. Pt reports consistent eating without over eating. Pt does not say whether he snacks or not. Discussed current A1c of 9.1. Discussed glucose and A1c goals in addition to the importance of glucose control.  Thanks,  Tama Headings RN, MSN, BC-ADM Inpatient Diabetes Coordinator Team Pager [phone] (8a-5p)

## 2020-06-27 ENCOUNTER — Observation Stay (HOSPITAL_COMMUNITY): Payer: Medicare HMO

## 2020-06-27 DIAGNOSIS — E1165 Type 2 diabetes mellitus with hyperglycemia: Secondary | ICD-10-CM | POA: Diagnosis not present

## 2020-06-27 DIAGNOSIS — R079 Chest pain, unspecified: Secondary | ICD-10-CM | POA: Diagnosis not present

## 2020-06-27 DIAGNOSIS — I5033 Acute on chronic diastolic (congestive) heart failure: Secondary | ICD-10-CM | POA: Diagnosis not present

## 2020-06-27 DIAGNOSIS — I10 Essential (primary) hypertension: Secondary | ICD-10-CM | POA: Diagnosis not present

## 2020-06-27 DIAGNOSIS — I208 Other forms of angina pectoris: Secondary | ICD-10-CM | POA: Diagnosis not present

## 2020-06-27 DIAGNOSIS — E119 Type 2 diabetes mellitus without complications: Secondary | ICD-10-CM | POA: Diagnosis not present

## 2020-06-27 DIAGNOSIS — R0789 Other chest pain: Secondary | ICD-10-CM | POA: Diagnosis not present

## 2020-06-27 DIAGNOSIS — D72819 Decreased white blood cell count, unspecified: Secondary | ICD-10-CM | POA: Diagnosis not present

## 2020-06-27 DIAGNOSIS — I2721 Secondary pulmonary arterial hypertension: Secondary | ICD-10-CM | POA: Diagnosis not present

## 2020-06-27 LAB — CBC
HCT: 32 % — ABNORMAL LOW (ref 39.0–52.0)
Hemoglobin: 10.1 g/dL — ABNORMAL LOW (ref 13.0–17.0)
MCH: 24.8 pg — ABNORMAL LOW (ref 26.0–34.0)
MCHC: 31.6 g/dL (ref 30.0–36.0)
MCV: 78.4 fL — ABNORMAL LOW (ref 80.0–100.0)
Platelets: 211 10*3/uL (ref 150–400)
RBC: 4.08 MIL/uL — ABNORMAL LOW (ref 4.22–5.81)
RDW: 14.1 % (ref 11.5–15.5)
WBC: 2.9 10*3/uL — ABNORMAL LOW (ref 4.0–10.5)
nRBC: 0 % (ref 0.0–0.2)

## 2020-06-27 LAB — HEPATIC FUNCTION PANEL
ALT: 11 U/L (ref 0–44)
AST: 13 U/L — ABNORMAL LOW (ref 15–41)
Albumin: 1.6 g/dL — ABNORMAL LOW (ref 3.5–5.0)
Alkaline Phosphatase: 81 U/L (ref 38–126)
Bilirubin, Direct: <0.1 mg/dL (ref 0.0–0.2)
Total Bilirubin: <0.1 mg/dL — ABNORMAL LOW (ref 0.3–1.2)
Total Protein: 5.3 g/dL — ABNORMAL LOW (ref 6.5–8.1)

## 2020-06-27 LAB — GLUCOSE, CAPILLARY
Glucose-Capillary: 151 mg/dL — ABNORMAL HIGH (ref 70–99)
Glucose-Capillary: 123 mg/dL — ABNORMAL HIGH (ref 70–99)
Glucose-Capillary: 172 mg/dL — ABNORMAL HIGH (ref 70–99)
Glucose-Capillary: 192 mg/dL — ABNORMAL HIGH (ref 70–99)

## 2020-06-27 MED ORDER — SACUBITRIL-VALSARTAN 49-51 MG PO TABS
1.0000 | ORAL_TABLET | Freq: Two times a day (BID) | ORAL | Status: DC
  Administered 2020-06-27 – 2020-06-28 (×2): 1 via ORAL
  Filled 2020-06-27 (×3): qty 1

## 2020-06-27 MED ORDER — ENSURE ENLIVE PO LIQD: 237.0000 mL | Freq: Two times a day (BID) | ORAL | Status: DC

## 2020-06-27 MED ORDER — TECHNETIUM TC 99M TETROFOSMIN IV KIT
10.0000 | PACK | Freq: Once | INTRAVENOUS | Status: AC | PRN
  Administered 2020-06-27: 10 via INTRAVENOUS

## 2020-06-27 MED ORDER — REGADENOSON 0.4 MG/5ML IV SOLN
0.4000 mg | Freq: Once | INTRAVENOUS | Status: AC
  Filled 2020-06-27: qty 5

## 2020-06-27 MED ORDER — FUROSEMIDE 10 MG/ML IJ SOLN
40.0000 mg | Freq: Every day | INTRAMUSCULAR | Status: DC
  Administered 2020-06-28: 40 mg via INTRAVENOUS
  Filled 2020-06-27: qty 4

## 2020-06-27 MED ORDER — TECHNETIUM TC 99M TETROFOSMIN IV KIT
32.0000 | PACK | Freq: Once | INTRAVENOUS | Status: AC | PRN
  Administered 2020-06-27: 32 via INTRAVENOUS

## 2020-06-27 MED ORDER — REGADENOSON 0.4 MG/5ML IV SOLN
INTRAVENOUS | Status: AC
  Administered 2020-06-27: 0.4 mg via INTRAVENOUS
  Filled 2020-06-27: qty 5

## 2020-06-27 NOTE — Progress Notes (Signed)
Initial Nutrition Assessment  DOCUMENTATION CODES:   Obesity unspecified  INTERVENTION:  Provide Ensure Enlive po BID, each supplement provides 350 kcal and 20 grams of protein.  Encourage adequate PO intake.   NUTRITION DIAGNOSIS:   Increased nutrient needs related to chronic illness (CHF) as evidenced by estimated needs.  GOAL:   Patient will meet greater than or equal to 90% of their needs  MONITOR:   PO intake,Supplement acceptance,Skin,Weight trends,Labs,I & O's  REASON FOR ASSESSMENT:   Consult Assessment of nutrition requirement/status  ASSESSMENT:   60 year old male with history of hypertension, CHF, diabetes mellitus type 2, CVA with residual right-sided weakness, depression, remote polysubstance abuse presents with complaints of shortness of breath, chest pain.  Pt unavailable during attempted time of visit. Plan for nuclear stress test today. RD unable to obtain pt nutrition history at this time. RD to order nutritional supplements to aid in caloric and protein needs. Unable to complete Nutrition-Focused physical exam at this time.   Labs and medications reviewed.   Diet Order:   Diet Order            Diet heart healthy/carb modified Room service appropriate? Yes; Fluid consistency: Thin  Diet effective now                 EDUCATION NEEDS:   Not appropriate for education at this time  Skin:  Skin Assessment: Reviewed RN Assessment  Last BM:  5/1  Height:   Ht Readings from Last 1 Encounters:  06/25/20 5\' 8"  (1.727 m)    Weight:   Wt Readings from Last 1 Encounters:  06/27/20 118.4 kg   BMI:  Body mass index is 39.68 kg/m.  Estimated Nutritional Needs:   Kcal:  2100-2300  Protein:  110-120 grams  Fluid:  >/= 2 L/day  Corrin Parker, MS, RD, LDN RD pager number/after hours weekend pager number on Amion.

## 2020-06-27 NOTE — Plan of Care (Signed)

## 2020-06-27 NOTE — Progress Notes (Addendum)
Physical Therapy Treatment Patient Details Name: [name] MRN: [id number] DOB: 1960-12-18 Today's Date: 06/27/2020    History of Present Illness Pt is a 60 y.o. male admitted 06/25/20 with c/o SOB, chest pain. Workup for hypertensive urgency. CT with small bilateral pleural effusions, concern for pulmonary hypertension. Cardiac stress test 5/3 with no significant ischemia or infarction. PMH includes CVA 10/2019 (residual R-side weakness, mostly w/c-bound), obesity, DM2, CHF, CKD, arthritis, bipolar affective.   PT Comments    Pt progressing with mobility. Pt adamantly declining standing activity secondary to fatigue despite education/encouragement. Agreeable to some LE therex (HEP handout provided). Noted improvements in SOB during conversation and seated activity; SpO2 95% on RA, HR 88. Pt reports feeling more stable standing and ambulating with RW earlier today; will recommend RW for home use. Will continue to follow acutely to address established goals.    Follow Up Recommendations  Home health PT;Supervision - Intermittent (at ALF/ILF)     Equipment Recommendations  Rolling walker with 5" wheels    Recommendations for Other Services       Precautions / Restrictions Precautions Precautions: Fall;Other (comment) Precaution Comments: H/o CVA (residual R-side weakness) Restrictions Weight Bearing Restrictions: No    Mobility  Bed Mobility               General bed mobility comments: Received sitting in recliner    Transfers                 General transfer comment: Pt declines standing attempts; reports fatigued from multiple sit<>stands with RW from bed, recliner and transport chair from stress test earlier today  Ambulation/Gait                 Stairs             Wheelchair Mobility    Modified Rankin (Stroke Patients Only)       Balance                                            Cognition Arousal/Alertness:  Awake/alert Behavior During Therapy: WFL for tasks assessed/performed Overall Cognitive Status: Within Functional Limits for tasks assessed                                 General Comments: WFL for simple tasks; pleasant and agreeable, but adamantly declining standing activity despite education/encouragement      Exercises Other Exercises Other Exercises: [employer] HEP handout provided (Access Code [email or web address]) - SLR, LAQ, seated march; pt has been performing ankle pumps    General Comments General comments (skin integrity, edema, etc.): Pt reports feeling more stable using RW (as opposed to walking stick), requesting one for home. Noted improvements in SOB this session during conversation and seated activity; SpO2 96% on RA, HR 88      Pertinent Vitals/Pain Pain Assessment: Faces Faces Pain Scale: Hurts a little bit Pain Location: Generalized Pain Descriptors / Indicators: Discomfort;Tiring Pain Intervention(s): Monitored during session    Home Living                      Prior Function            PT Goals (current goals can now be found in the care plan section) Acute Rehab PT Goals Patient Stated Goal: Hopeful  to start PT/OT services at ALF Progress towards PT goals: Progressing toward goals    Frequency    Min 3X/week      PT Plan Current plan remains appropriate    Co-evaluation              AM-PAC PT "6 Clicks" Mobility   Outcome Measure  Help needed turning from your back to your side while in a flat bed without using bedrails?: None Help needed moving from lying on your back to sitting on the side of a flat bed without using bedrails?: A Little Help needed moving to and from a bed to a chair (including a wheelchair)?: A Little Help needed standing up from a chair using your arms (e.g., wheelchair or bedside chair)?: A Little Help needed to walk in hospital room?: A Little Help needed climbing 3-5 steps with a railing? : A Lot 6  Click Score: 18    End of Session   Activity Tolerance: Patient limited by fatigue Patient left: in chair;with call bell/phone within reach Nurse Communication: Mobility status PT Visit Diagnosis: Other abnormalities of gait and mobility (R26.89);Muscle weakness (generalized) (M62.81)     Time: 8264-1583 PT Time Calculation (min) (ACUTE ONLY): 21 min  Charges:  $Therapeutic Exercise: 8-22 mins                     Mabeline Caras, PT, DPT Acute Rehabilitation Services  Pager [phone] Office [place] 06/27/2020, 5:33 PM

## 2020-06-27 NOTE — Progress Notes (Signed)
[facility]  PROGRESS NOTE  [name]:[id number] DOB: 1960/06/30 DOA: 06/25/2020 PCP: [name], NP   Brief HPI:   60 year old male with history of hypertension, CHF, diabetes mellitus type 2, CVA with residual right-sided weakness, depression, remote polysubstance abuse presents with complaints of shortness of breath, chest pain.  In the ED lab work showed troponin 25, BNP 168.5.  Chest x-ray showed resolved pulmonary edema since prior exam.  CT brain showed no acute abnormality.  CT imaging did mention right vertex scalp abnormality resembling small scalp hematoma, age-indeterminate lacunar infarct of the left choroid nucleus that was new from previous imaging in 2019.  CTA chest showed no acute pulmonary infarct with pulmonary artery hypertension groundglass opacities throughout both lungs, small bilateral pleural effusion and bilateral thyroid nodules.    Subjective   Patient seen and examined, seen by cardiology.  Plan for nuclear cardiac stress test today.   Assessment/Plan:     1. Chest pain-patient has mild elevation of troponin, EKG showed nonspecific T wave abnormality in lateral leads.  Echocardiogram shows grade 1 diastolic dysfunction.  Moderately elevated pulmonary artery systolic pressure.  Cardiology was consulted, plan for nuclear stress test today. 2. Hypertensive urgency-blood pressure was elevated to 201/112.  Improved with IV labetalol.  Blood pressure is better controlled, continue amlodipine 10 mg daily.  Continue as needed hydralazine. 3. Pulmonary artery hypertension-echocardiogram shows moderately reduced pulmonary artery pressure.  Patient currently not requiring oxygen. 4. Diabetes mellitus type 2-hemoglobin A1c is 9.1.  Continue sliding scale insulin NovoLog, Lantus 20 subcu daily.  CBG well controlled. 5. Leukopenia-acute on chronic, WBC 2.9 appears to be slightly lower than previously in the past.  Follow CBC in a.m. 6. Iron deficiency  anemia-patient's hemoglobin is 11, serum iron studies from November 2021 showed serum iron of 33 with saturation 13%.  Patient is followed by [facility].  He had 2 attempts of colonoscopy November 2021 and December 2021 but patient had inadequate prep.  Follow-up as outpatient.   7. Hypoalbuminemia-unclear etiology, albumin was 2.2 in January 2022,.  He presented with albumin of 1.9.  Will obtain nutrition consult. 8. CKD stage II-creatinine is 1.36, at baseline. 9. History of CVA with residual deficit-patient has known history of CVA in September 2021 which left him with residual right-sided weakness and mostly wheelchair-bound.  PT evaluation. 10. Thyroid nodule-CTA chest shows bilateral thyroid nodules measuring 2.6 cm on the right.  TSH 1.714.  Will need thyroid ultrasound as outpatient.  Follow-up PCP for further work-up.   Scheduled medications:   . amLODipine  10 mg Oral Daily  . atorvastatin  20 mg Oral Daily  . enoxaparin (LOVENOX) injection  40 mg Subcutaneous Q24H  . furosemide  40 mg Intravenous BID  . gabapentin  100 mg Oral TID  . insulin aspart  0-15 Units Subcutaneous TID WC  . insulin glargine  20 Units Subcutaneous QHS  . tamsulosin  0.4 mg Oral Daily         Data Reviewed:   CBG:  Recent Labs  Lab 06/26/20 1206 06/26/20 1633 06/26/20 2123 06/27/20 0758 06/27/20 1418  GLUCAP 154* 151* 221* 123* 172*    SpO2: 94 %    Vitals:   06/27/20 1221 06/27/20 1223 06/27/20 1225 06/27/20 1340  BP: (!) 220/99 (!) 197/99 (!) 207/103 (!) 164/93  Pulse: 88 89 88 87  Resp:    18  Temp:    97.7 F (36.5 C)  TempSrc:    Oral  SpO2:  94%  Weight:      Height:         Intake/Output Summary (Last 24 hours) at 06/27/2020 1503 Last data filed at 06/27/2020 0800 Gross per 24 hour  Intake 200 ml  Output 3325 ml  Net -3125 ml    05/01 1901 - 05/03 0700 In: 600 [P.O.:600] Out: 3750 [Urine:3750]  Filed Weights   06/25/20 0431 06/27/20 0312  Weight: 103.9 kg 118.4  kg    CBC:  Recent Labs  Lab 06/25/20 0538 06/27/20 0301  WBC 2.9* 2.9*  HGB 11.0* 10.1*  HCT 35.0* 32.0*  PLT 218 211  MCV 78.5* 78.4*  MCH 24.7* 24.8*  MCHC 31.4 31.6  RDW 13.5 14.1  LYMPHSABS 0.9  --   MONOABS 0.4  --   EOSABS 0.1  --   BASOSABS 0.0  --     Complete metabolic panel:  Recent Labs  Lab 06/25/20 0456 06/25/20 1244 06/25/20 1314 06/27/20 0301  NA 133*  --   --   --   K 4.0  --   --   --   CL 103  --   --   --   CO2 22  --   --   --   GLUCOSE 215*  --   --   --   BUN 11  --   --   --   CREATININE 1.36*  --   --   --   CALCIUM 8.3*  --   --   --   AST 20  --   --  13*  ALT 13  --   --  11  ALKPHOS 102  --   --  81  BILITOT 0.6  --   --  <0.1*  ALBUMIN 1.9*  --   --  1.6*  TSH  --  1.714  --   --   HGBA1C  --   --  9.1*  --   BNP 168.5*  --   --   --     Recent Labs  Lab 06/25/20 0456  LIPASE 30    Recent Labs  Lab 06/25/20 0456 06/25/20 0832  BNP 168.5*  --   SARSCOV2NAA  --  NEGATIVE    ------------------------------------------------------------------------------------------------------------------ Recent Labs    06/25/20 0711  CHOL 305*  HDL 63  LDLCALC 224*  TRIG 90  CHOLHDL 4.8    Lab Results  Component Value Date   HGBA1C 9.1 (H) 06/25/2020   ------------------------------------------------------------------------------------------------------------------ Recent Labs    06/25/20 1244  TSH 1.714   ------------------------------------------------------------------------------------------------------------------ No results for input(s): VITAMINB12, FOLATE, FERRITIN, TIBC, IRON, RETICCTPCT in the last 72 hours.  Coagulation profile No results for input(s): INR, PROTIME in the last 168 hours. No results for input(s): DDIMER in the last 72 hours.  Cardiac Enzymes No results for input(s): CKTOTAL, CKMB, CKMBINDEX, TROPONINI in the last 168  hours.  ------------------------------------------------------------------------------------------------------------------    Component Value Date/Time   BNP 168.5 (H) 06/25/2020 0456     Antibiotics: Anti-infectives (From admission, onward)   None       Radiology Reports  DG Abd 1 View  Result Date: 06/26/2020 CLINICAL DATA:  Onset abdominal pain and vomiting yesterday. EXAM: ABDOMEN - 1 VIEW COMPARISON:  Single-view of the abdomen 01/27/2020. FINDINGS: Mild gaseous distention of the stomach. No small bowel dilatation. Large stool burden in the ascending and transverse colon noted. No unexpected abdominal calcification. IMPRESSION: Mild gaseous distention of the stomach is likely incidental. Large stool burden ascending and  transverse colon. Electronically Signed   By: Inge Rise M.D.   On: 06/26/2020 12:10      DVT prophylaxis: Lovenox  Code Status: Full code  Family Communication: No family at bedside   Consultants:    Procedures:      Objective    Physical Examination:    General-appears in no acute distress  Heart-S1-S2, regular, no murmur auscultated  Lungs-clear to auscultation bilaterally, no wheezing or crackles auscultated  Abdomen-soft, nontender, no organomegaly  Extremities-no edema in the lower extremities  Neuro-alert, oriented x3, no focal deficit noted   Status is: Inpatient  Dispo: The patient is from: Home              Anticipated d/c is to: Home              Anticipated d/c date is: 06/28/2020              Patient currently not stable for discharge  Barrier to discharge-ongoing evaluation for chest pain  COVID-19 Labs  No results for input(s): DDIMER, FERRITIN, LDH, CRP in the last 72 hours.  Lab Results  Component Value Date   SARSCOV2NAA NEGATIVE 06/25/2020   SARSCOV2NAA POSITIVE (A) 03/08/2020   [place] NEGATIVE 01/31/2020   [place] NEGATIVE 01/03/2020    Microbiology  Recent Results (from the past  240 hour(s))  Resp Panel by RT-PCR (Flu A&B, Covid) Nasopharyngeal Swab     Status: None   Collection Time: 06/25/20  8:32 AM   Specimen: Nasopharyngeal Swab; Nasopharyngeal(NP) swabs in vial transport medium  Result Value Ref Range Status   SARS Coronavirus 2 by RT PCR NEGATIVE NEGATIVE Final    Comment: (NOTE) SARS-CoV-2 target nucleic acids are NOT DETECTED.  The SARS-CoV-2 RNA is generally detectable in upper respiratory specimens during the acute phase of infection. The lowest concentration of SARS-CoV-2 viral copies this assay can detect is 138 copies/mL. A negative result does not preclude SARS-Cov-2 infection and should not be used as the sole basis for treatment or other patient management decisions. A negative result may occur with  improper specimen collection/handling, submission of specimen other than nasopharyngeal swab, presence of viral mutation(s) within the areas targeted by this assay, and inadequate number of viral copies(<138 copies/mL). A negative result must be combined with clinical observations, patient history, and epidemiological information. The expected result is Negative.  Fact Sheet for Patients:  EntrepreneurPulse.com.au  Fact Sheet for Healthcare Providers:  IncredibleEmployment.be  This test is no t yet approved or cleared by the [place] FDA and  has been authorized for detection and/or diagnosis of SARS-CoV-2 by FDA under an Emergency Use Authorization (EUA). This EUA will remain  in effect (meaning this test can be used) for the duration of the COVID-19 declaration under Section 564(b)(1) of the Act, 21 U.S.C.section 360bbb-3(b)(1), unless the authorization is terminated  or revoked sooner.       Influenza A by PCR NEGATIVE NEGATIVE Final   Influenza B by PCR NEGATIVE NEGATIVE Final    Comment: (NOTE) The Xpert Xpress SARS-CoV-2/FLU/RSV plus assay is intended as an aid in the diagnosis of influenza  from Nasopharyngeal swab specimens and should not be used as a sole basis for treatment. Nasal washings and aspirates are unacceptable for Xpert Xpress SARS-CoV-2/FLU/RSV testing.  Fact Sheet for Patients: EntrepreneurPulse.com.au  Fact Sheet for Healthcare Providers: IncredibleEmployment.be  This test is not yet approved or cleared by the [place] FDA and has been authorized for detection and/or diagnosis of SARS-CoV-2 by FDA  under an Emergency Use Authorization (EUA). This EUA will remain in effect (meaning this test can be used) for the duration of the COVID-19 declaration under Section 564(b)(1) of the Act, 21 U.S.C. section 360bbb-3(b)(1), unless the authorization is terminated or revoked.  Performed at [facility], [place] [address], [place], [place] [address]          [facility] If 7PM-7AM, please contact night-coverage at [email or web address], Office  [phone]   06/27/2020, 3:03 PM  LOS: 0 days

## 2020-06-27 NOTE — Progress Notes (Signed)
Reviewed stress test. No significant ischemia or infarction. Recommend starting Entresto 49-51 mg bid for management of HFpEF, will also help given hypertension and diabetes. In addition, recommend lasix 40 mg daily. Will arrange outpatient follow up.  Cardiology signing off. Please call us back, in case of any questions.    Nigel Mormon, MD Pager: [phone] Office: [phone]

## 2020-06-28 DIAGNOSIS — R079 Chest pain, unspecified: Secondary | ICD-10-CM | POA: Diagnosis not present

## 2020-06-28 DIAGNOSIS — R279 Unspecified lack of coordination: Secondary | ICD-10-CM | POA: Diagnosis not present

## 2020-06-28 DIAGNOSIS — R69 Illness, unspecified: Secondary | ICD-10-CM | POA: Diagnosis not present

## 2020-06-28 DIAGNOSIS — R0789 Other chest pain: Secondary | ICD-10-CM | POA: Diagnosis not present

## 2020-06-28 DIAGNOSIS — I10 Essential (primary) hypertension: Secondary | ICD-10-CM | POA: Diagnosis not present

## 2020-06-28 DIAGNOSIS — Z743 Need for continuous supervision: Secondary | ICD-10-CM | POA: Diagnosis not present

## 2020-06-28 LAB — BASIC METABOLIC PANEL
Anion gap: 3 — ABNORMAL LOW (ref 5–15)
BUN: 19 mg/dL (ref 6–20)
CO2: 27 mmol/L (ref 22–32)
Calcium: 7.9 mg/dL — ABNORMAL LOW (ref 8.9–10.3)
Chloride: 103 mmol/L (ref 98–111)
Creatinine, Ser: 1.88 mg/dL — ABNORMAL HIGH (ref 0.61–1.24)
GFR, Estimated: 41 mL/min — ABNORMAL LOW (ref >60)
Glucose, Bld: 214 mg/dL — ABNORMAL HIGH (ref 70–99)
Potassium: 4 mmol/L (ref 3.5–5.1)
Sodium: 133 mmol/L — ABNORMAL LOW (ref 135–145)

## 2020-06-28 LAB — GLUCOSE, CAPILLARY
Glucose-Capillary: 205 mg/dL — ABNORMAL HIGH (ref 70–99)
Glucose-Capillary: 173 mg/dL — ABNORMAL HIGH (ref 70–99)
Glucose-Capillary: 218 mg/dL — ABNORMAL HIGH (ref 70–99)
Glucose-Capillary: 150 mg/dL — ABNORMAL HIGH (ref 70–99)

## 2020-06-28 MED ORDER — SACUBITRIL-VALSARTAN 49-51 MG PO TABS
1.0000 | ORAL_TABLET | Freq: Two times a day (BID) | ORAL | 2 refills | Status: DC
Start: 2020-06-28 — End: 2020-07-05

## 2020-06-28 MED ORDER — FUROSEMIDE 80 MG PO TABS: 40.0000 mg | ORAL_TABLET | Freq: Every day | ORAL | 2 refills | Status: DC

## 2020-06-28 MED ORDER — ATORVASTATIN CALCIUM 20 MG PO TABS: 20.0000 mg | ORAL_TABLET | Freq: Every day | ORAL | 3 refills | Status: DC

## 2020-06-28 MED ORDER — ENSURE ENLIVE PO LIQD: 237.0000 mL | Freq: Two times a day (BID) | ORAL | 2 refills | Status: DC

## 2020-06-28 NOTE — Discharge Summary (Signed)
Physician Discharge Summary  [name] FKC:[id number] DOB: 20-Jan-1961 DOA: 06/25/2020  PCP: [name], NP  Admit date: 06/25/2020 Discharge date: 06/28/2020  Admitted From: ALF. Disposition:  ALF  Recommendations for Outpatient Follow-up:  1. Follow up with PCP in 1-2 weeks 2. Please obtain BMP/CBC in one week Please follow up with cardiology as recommended.  Please follow up with a thyroid ultrasound in view of the thyroid nodules on the CTA.   Discharge Condition:Guarded.  CODE STATUS: Full code.  Diet recommendation: Heart Healthy  Brief/Interim Summary:  60 year old male with history of hypertension, CHF, diabetes mellitus type 2, CVA with residual right-sided weakness, depression, remote polysubstance abuse presents with complaints of shortness of breath, chest pain.  In the ED lab work showed troponin 25, BNP 168.5.  Chest x-ray showed resolved pulmonary edema since prior exam.  CT brain showed no acute abnormality.  CT imaging did mention right vertex scalp abnormality resembling small scalp hematoma, age-indeterminate lacunar infarct of the left choroid nucleus that was new from previous imaging in 2019.  CTA chest showed no acute pulmonary infarct with pulmonary artery hypertension groundglass opacities throughout both lungs, small bilateral pleural effusion and bilateral thyroid nodules.   Discharge Diagnoses:  Principal Problem:   Atypical chest pain Active Problems:   Essential hypertension   Leukopenia   Diabetes mellitus type 2, uncontrolled (HCC)   Microcytic anemia   Malnutrition of moderate degree   Chest pain   Pulmonary artery hypertension (HCC)   Chronic kidney disease, stage II (mild)   History of cerebrovascular accident (CVA) with residual deficit   Acute on chronic heart failure with preserved ejection fraction ([place])   1. Chest pain-patient has mild elevation of troponin, EKG showed nonspecific T wave abnormality in lateral leads.  Echocardiogram  shows grade 1 diastolic dysfunction.  Moderately elevated pulmonary artery systolic pressure.  Cardiology was consulted, plan for nuclear stress test , which was negative for acute infarction or ischemia, cardiology recommends outpatient follow up. 2. Hypertensive urgency-blood pressure was elevated to 201/112.  Improved with IV labetalol.  Blood pressure is better controlled, continue amlodipine 10 mg daily.  added entresto and continue with lasix as per cardiology.  3. Pulmonary artery hypertension-echocardiogram shows moderately reduced pulmonary artery pressure.  Patient currently not requiring oxygen. 4. Diabetes mellitus type 2-hemoglobin A1c is 9.1.  Continue sliding scale insulin NovoLog, Lantus 20 subcu daily.  CBG well controlled. 5. Leukopenia-acute on chronic, WBC 2.9 appears to be slightly lower than previously in the past.  Follow CBC in one week. 6. Iron deficiency anemia-patient's hemoglobin is 11, serum iron studies from November 2021 showed serum iron of 33 with saturation 13%.  Patient is followed by [facility].  He had 2 attempts of colonoscopy November 2021 and December 2021 but patient had inadequate prep.  Follow-up as outpatient.   7. Hypoalbuminemia-unclear etiology, albumin was 2.2 in January 2022,.  He presented with albumin of 1.9. Recommend nutrition follow up . 8. CKD stage II-creatinine I slightly up from baseline . Decreased the dose of lasix from 80 mg to 40 mg daily.  9. History of CVA with residual deficit-patient has known history of CVA in September 2021 which left him with residual right-sided weakness and mostly wheelchair-bound. Therapy evaluations recommending home health PT, which will be ordered.  10. Thyroid nodule-CTA chest shows bilateral thyroid nodules measuring 2.6 cm on the right.  TSH 1.714.  Will need thyroid ultrasound as outpatient.  Follow-up PCP for further work-up.  Discharge Instructions  Discharge Instructions    Diet - low sodium heart  healthy   Complete by: As directed    Discharge instructions   Complete by: As directed    Please follow up with cardiology as recommended.     Allergies as of 06/28/2020      Reactions   Other Anaphylaxis, Itching, Swelling   Allergy to GRITS , unknown exactly the specific ingredient he is allergic too      Medication List    STOP taking these medications   cephALEXin 500 MG capsule Commonly known as: KEFLEX   doxycycline 100 MG capsule Commonly known as: VIBRAMYCIN   gabapentin 100 MG capsule Commonly known as: Neurontin   lisinopril-hydrochlorothiazide 20-25 MG tablet Commonly known as: ZESTORETIC   metFORMIN 1000 MG tablet Commonly known as: GLUCOPHAGE   metroNIDAZOLE 500 MG tablet Commonly known as: Flagyl   nystatin powder Commonly known as: MYCOSTATIN/NYSTOP   spironolactone 50 MG tablet Commonly known as: ALDACTONE     TAKE these medications   amLODipine 10 MG tablet Commonly known as: NORVASC Take 1 tablet (10 mg total) by mouth daily.   ammonium lactate 12 % lotion Commonly known as: LAC-HYDRIN Apply 1 application topically 2 (two) times daily. Both legs   atorvastatin 20 MG tablet Commonly known as: LIPITOR Take 1 tablet (20 mg total) by mouth daily.   feeding supplement Liqd Take 237 mLs by mouth 2 (two) times daily between meals.   furosemide 80 MG tablet Commonly known as: LASIX Take 0.5 tablets (40 mg total) by mouth daily. What changed: how much to take   insulin aspart 100 UNIT/ML injection Commonly known as: novoLOG Inject 12 Units into the skin 3 (three) times daily before meals. For blood sugars 0-150 give 0 units of insulin, 151-200 give 2 units of insulin, 201-250 give 4 units, 251-300 give 6 units, 301-350 give 8 units, 351-400 give 10 units,> 400 give 12 units and call M.D.   Lantus SoloStar 100 UNIT/ML Solostar Pen Generic drug: insulin glargine Inject 20 Units into the skin at bedtime.   polyethylene glycol 17 g  packet Commonly known as: MIRALAX / GLYCOLAX Take 17 g by mouth daily as needed. What changed: when to take this   pregabalin 75 MG capsule Commonly known as: LYRICA Take 75 mg by mouth 2 (two) times daily.   sacubitril-valsartan 49-51 MG Commonly known as: ENTRESTO Take 1 tablet by mouth 2 (two) times daily.   sertraline 50 MG tablet Commonly known as: ZOLOFT Take 50 mg by mouth daily.   tamsulosin 0.4 MG Caps capsule Commonly known as: FLOMAX Take 1 capsule (0.4 mg total) by mouth daily. What changed: how much to take       Allergies  Allergen Reactions  . Other Anaphylaxis, Itching and Swelling    Allergy to GRITS , unknown exactly the specific ingredient he is allergic too    Consultations:  Cardiology.    Procedures/Studies: DG Abd 1 View  Result Date: 06/26/2020 CLINICAL DATA:  Onset abdominal pain and vomiting yesterday. EXAM: ABDOMEN - 1 VIEW COMPARISON:  Single-view of the abdomen 01/27/2020. FINDINGS: Mild gaseous distention of the stomach. No small bowel dilatation. Large stool burden in the ascending and transverse colon noted. No unexpected abdominal calcification. IMPRESSION: Mild gaseous distention of the stomach is likely incidental. Large stool burden ascending and transverse colon. Electronically Signed   By: Inge Rise M.D.   On: 06/26/2020 12:10   CT HEAD WO CONTRAST  Result  Date: 06/25/2020 CLINICAL DATA:  60 year old male with headache and chest pain. EXAM: CT HEAD WITHOUT CONTRAST TECHNIQUE: Contiguous axial images were obtained from the base of the skull through the vertex without intravenous contrast. COMPARISON:  Head CT 09/28/2017. FINDINGS: Brain: Small new hypodensity in the left caudate on series 2, image 15. Subtle dystrophic bilateral basal ganglia calcifications. Generalized cerebral volume loss since 2019. No midline shift, ventriculomegaly, mass effect, evidence of mass lesion, intracranial hemorrhage or evidence of cortically based  acute infarction. Outside of the left caudate gray-white matter differentiation appears stable and within normal limits. Vascular: Calcified atherosclerosis at the skull base. No suspicious intracranial vascular hyperdensity. Skull: No acute osseous abnormality identified. Sinuses/Orbits: Visualized paranasal sinuses and mastoids are stable and well aerated. Other: There is a broad-based right vertex scalp fluid collection most resembling hematoma on series 3, image 67. Underlying calvarium appears stable and intact. Otherwise stable and negative visible orbit and scalp soft tissues. IMPRESSION: 1. Right vertex scalp abnormality most resembling a small scalp hematoma. Underlying calvarium intact. 2. Age indeterminate lacunar infarct of the left caudate nucleus, new since 2019. Query right side weakness. 3. No other acute intracranial abnormality identified. Electronically Signed   By: Genevie Ann M.D.   On: 06/25/2020 08:04   CT ANGIO CHEST PE W OR WO CONTRAST  Result Date: 06/25/2020 CLINICAL DATA:  Chest pain and headache. EXAM: CT ANGIOGRAPHY CHEST WITH CONTRAST TECHNIQUE: Multidetector CT imaging of the chest was performed using the standard protocol during bolus administration of intravenous contrast. Multiplanar CT image reconstructions and MIPs were obtained to evaluate the vascular anatomy. CONTRAST:  93mL OMNIPAQUE IOHEXOL 350 MG/ML SOLN COMPARISON:  12/12/2019 FINDINGS: Cardiovascular: Heart is enlarged. Small pericardial effusion has increased minimally in the interval. Coronary artery calcification is evident. Atherosclerotic calcification is noted in the wall of the thoracic aorta. Enlargement of the pulmonary outflow tract and main pulmonary arteries suggests pulmonary arterial hypertension. There is no filling defect within the opacified pulmonary arteries to suggest the presence of an acute pulmonary embolus. Mediastinum/Nodes: Bilateral thyroid nodules evident measuring up to 2.6 cm on the right. No  mediastinal lymphadenopathy. Upper normal hilar lymph nodes evident bilaterally. The esophagus has normal imaging features. There is no axillary lymphadenopathy. Lungs/Pleura: Patchy ground-glass opacity is identified in the upper lungs bilaterally with dependent ground-glass airspace disease in the lower lobes, right greater than left and potentially atelectasis. Several tiny pulmonary nodules are evident but no overtly suspicious pulmonary nodule or mass. Small bilateral pleural effusions. Upper Abdomen: Calcified gallstone incompletely visualized but measuring up to at least 2.2 cm. Musculoskeletal: No worrisome lytic or sclerotic osseous abnormality. Bilateral gynecomastia with body wall edema. Review of the MIP images confirms the above findings. IMPRESSION: 1. No CT evidence for acute pulmonary embolus. 2. Enlargement of the pulmonary outflow tract and main pulmonary arteries suggests pulmonary arterial hypertension. 3. Patchy ground-glass opacity in the upper lungs bilaterally with dependent ground-glass airspace disease in the lower lobes, right greater than left. Basilar findings may reflect atelectasis although superimposed infectious/inflammatory etiology not excluded. 4. Small bilateral pleural effusions. 5. Bilateral thyroid nodules measuring up to 2.6 cm on the right. Recommend thyroid US (ref: J Am Coll Radiol. 2015 Feb;12(2): 143-50). 6. Cholelithiasis. 7. Aortic Atherosclerosis (ICD10-I70.0). Electronically Signed   By: Misty Stanley M.D.   On: 06/25/2020 08:02   NM Myocar Multi W/Spect Tamela Oddi Motion / EF  Result Date: 06/27/2020 CLINICAL DATA:  Chest pain/anginal equivalent. Known coronary artery disease. Diabetes and hypertension. EXAM: MYOCARDIAL  IMAGING WITH SPECT (REST AND PHARMACOLOGIC-STRESS) GATED LEFT VENTRICULAR WALL MOTION STUDY LEFT VENTRICULAR EJECTION FRACTION TECHNIQUE: Standard myocardial SPECT imaging was performed after resting intravenous injection of 10 mCi Tc-26m tetrofosmin.  Subsequently, intravenous infusion of Lexiscan was performed under the supervision of the Cardiology staff. At peak effect of the drug, 30 mCi Tc-39m tetrofosmin was injected intravenously and standard myocardial SPECT imaging was performed. Quantitative gated imaging was also performed to evaluate left ventricular wall motion, and estimate left ventricular ejection fraction. COMPARISON:  None. FINDINGS: Perfusion: Decreased myocardial activity is seen in the inferior wall on both stress and resting images, likely due to diaphragmatic attenuation. No reversible myocardial perfusion defects are seen to suggest the presence of inducible ischemia. Wall Motion: Normal left ventricular wall motion. No left ventricular dilation. Left Ventricular Ejection Fraction: 50 % End diastolic volume 161 ml End systolic volume 67 ml IMPRESSION: 1. No reversible ischemia. Probable inferior wall diaphragmatic attenuation. 2. Normal left ventricular wall motion. 3. Left ventricular ejection fraction 50% 4. Non invasive risk stratification*: Low *2012 Appropriate Use Criteria for Coronary Revascularization Focused Update: J Am Coll Cardiol. 0960;45(4):098-119. http://content.airportbarriers.com.aspx?articleid=[email or web address] Electronically Signed   By: Marlaine Hind M.D.   On: 06/27/2020 15:29   DG Chest Port 1 View  Result Date: 06/25/2020 CLINICAL DATA:  60 year old male with chest pain and shortness of breath. EXAM: PORTABLE CHEST 1 VIEW COMPARISON:  Portable chest 03/08/2020 and earlier. FINDINGS: Portable AP upright view at 0504 hours. Improved lung volumes and ventilation compared to January. Normalized pulmonary vascularity. Visualized tracheal air column is within normal limits. No pneumothorax, pleural effusion or confluent pulmonary opacity. Stable mild cardiomegaly and other mediastinal contours. No acute osseous abnormality identified. Prior cervical ACDF. Paucity of bowel gas in the upper abdomen. IMPRESSION: Resolved  pulmonary edema since January. No acute cardiopulmonary abnormality. Electronically Signed   By: Genevie Ann M.D.   On: 06/25/2020 05:38   ECHOCARDIOGRAM COMPLETE  Result Date: 06/25/2020    ECHOCARDIOGRAM REPORT   Patient Name:   [name] Date of Exam: 06/25/2020 Medical Rec #:  [id number]     Height:       68.0 in Accession #:    [id number]    Weight:       229.0 lb Date of Birth:  27-Jan-1961      BSA:          2.165 m Patient Age:    45 years      BP:           170/107 mmHg Patient Gender: M             HR:           86 bpm. Exam Location:  Inpatient Procedure: 2D Echo, Cardiac Doppler, Color Doppler and Strain Analysis Indications:    R07.9* Chest pain, unspecified  History:        Patient has prior history of Echocardiogram examinations, most                 recent 12/11/2017. Stroke; Risk Factors:Hypertension.  Sonographer:    Merrie Roof Referring Phys: [id number] RONDELL A SMITH IMPRESSIONS  1. Left ventricular ejection fraction, by estimation, is 60 to 65%. The left ventricle has normal function. The left ventricle has no regional wall motion abnormalities. There is severe left ventricular hypertrophy. Left ventricular diastolic parameters  are consistent with Grade I diastolic dysfunction (impaired relaxation).  2. Right ventricular systolic function is normal. The right ventricular size is normal. There is  moderately elevated pulmonary artery systolic pressure. The estimated right ventricular systolic pressure is 22.0 mmHg.  3. Left atrial size was moderately dilated.  4. A small pericardial effusion is present. The pericardial effusion is circumferential. There is no evidence of cardiac tamponade.  5. The mitral valve is normal in structure. Mild to moderate mitral valve regurgitation. No evidence of mitral stenosis.  6. The aortic valve is calcified. There is moderate thickening of the aortic valve. Aortic valve regurgitation is mild. Mild aortic valve stenosis. Aortic valve area, by VTI measures 1.26 cm.  Aortic valve mean gradient measures 14.0 mmHg. Aortic valve Vmax measures 2.48 m/s.  7. The inferior vena cava is dilated in size with <50% respiratory variability, suggesting right atrial pressure of 15 mmHg.  8. There is a small patent foramen ovale. Comparison(s): No significant change from prior study. Prior images reviewed side by side. FINDINGS  Left Ventricle: Left ventricular ejection fraction, by estimation, is 60 to 65%. The left ventricle has normal function. The left ventricle has no regional wall motion abnormalities. Global longitudinal strain performed but not reported based on interpreter judgement due to suboptimal tracking. The left ventricular internal cavity size was normal in size. There is severe left ventricular hypertrophy. Left ventricular diastolic parameters are consistent with Grade I diastolic dysfunction (impaired relaxation). Right Ventricle: The right ventricular size is normal. No increase in right ventricular wall thickness. Right ventricular systolic function is normal. There is moderately elevated pulmonary artery systolic pressure. The tricuspid regurgitant velocity is 3.06 m/s, and with an assumed right atrial pressure of 15 mmHg, the estimated right ventricular systolic pressure is 25.4 mmHg. Left Atrium: Left atrial size was moderately dilated. Right Atrium: Right atrial size was normal in size. Pericardium: A small pericardial effusion is present. The pericardial effusion is circumferential. There is no evidence of cardiac tamponade. Mitral Valve: The mitral valve is normal in structure. Mild to moderate mitral valve regurgitation. No evidence of mitral valve stenosis. Tricuspid Valve: The tricuspid valve is normal in structure. Tricuspid valve regurgitation is mild . No evidence of tricuspid stenosis. Aortic Valve: The aortic valve is calcified. There is moderate thickening of the aortic valve. There is moderate aortic valve annular calcification. Aortic valve regurgitation  is mild. Mild aortic stenosis is present. Aortic valve mean gradient measures 14.0 mmHg. Aortic valve peak gradient measures 24.5 mmHg. Aortic valve area, by VTI measures 1.26 cm. Pulmonic Valve: The pulmonic valve was normal in structure. Pulmonic valve regurgitation is not visualized. No evidence of pulmonic stenosis. Aorta: The aortic root is normal in size and structure. Venous: The inferior vena cava is dilated in size with less than 50% respiratory variability, suggesting right atrial pressure of 15 mmHg. IAS/Shunts: No atrial level shunt detected by color flow Doppler. A small patent foramen ovale is detected.  LEFT VENTRICLE PLAX 2D LVIDd:         4.35 cm  Diastology LVIDs:         2.85 cm  LV e' medial:    4.03 cm/s LV PW:         1.45 cm  LV E/e' medial:  13.7 LV IVS:        1.40 cm  LV e' lateral:   6.42 cm/s LVOT diam:     2.10 cm  LV E/e' lateral: 8.6 LV SV:         57 LV SV Index:   27 LVOT Area:     3.46 cm  RIGHT VENTRICLE  IVC RV Basal diam:  3.70 cm  IVC diam: 2.50 cm LEFT ATRIUM              Index       RIGHT ATRIUM           Index LA diam:        4.65 cm  2.15 cm/m  RA Area:     24.30 cm LA Vol (A2C):   113.0 ml 52.20 ml/m RA Volume:   75.20 ml  34.74 ml/m LA Vol (A4C):   102.0 ml 47.12 ml/m LA Biplane Vol: 109.0 ml 50.35 ml/m  AORTIC VALVE AV Area (Vmax):    1.21 cm AV Area (Vmean):   1.13 cm AV Area (VTI):     1.26 cm AV Vmax:           247.50 cm/s AV Vmean:          171.500 cm/s AV VTI:            0.456 m AV Peak Grad:      24.5 mmHg AV Mean Grad:      14.0 mmHg LVOT Vmax:         86.50 cm/s LVOT Vmean:        56.100 cm/s LVOT VTI:          0.166 m LVOT/AV VTI ratio: 0.36  AORTA Ao Root diam: 3.10 cm Ao Asc diam:  3.60 cm MITRAL VALVE                TRICUSPID VALVE MV Area (PHT): 4.71 cm     TR Peak grad:   37.5 mmHg MV Decel Time: 161 msec     TR Vmax:        306.00 cm/s MV E velocity: 55.30 cm/s MV A velocity: 102.00 cm/s  SHUNTS MV E/A ratio:  0.54         Systemic VTI:   0.17 m                             Systemic Diam: 2.10 cm Candee Furbish MD Electronically signed by Candee Furbish MD Signature Date/Time: 06/25/2020/12:21:05 PM    Final        Subjective: No chest pain or sob.   Discharge Exam: Vitals:   06/28/20 0400 06/28/20 0903  BP: 138/78 (!) 179/104  Pulse: 82   Resp: 18   Temp: 97.9 F (36.6 C)   SpO2: 98%    Vitals:   06/27/20 2046 06/28/20 0400 06/28/20 0900 06/28/20 0903  BP: (!) 143/96 138/78  (!) 179/104  Pulse: 86 82    Resp: 19 18    Temp: 97.9 F (36.6 C) 97.9 F (36.6 C)    TempSrc: Oral Oral    SpO2: 98% 98%    Weight:   115.1 kg   Height:        General: Pt is alert, awake, not in acute distress Cardiovascular: RRR, S1/S2 +, no rubs, no gallops Respiratory: CTA bilaterally, no wheezing, no rhonchi Abdominal: Soft, NT, ND, bowel sounds + Extremities: no edema, no cyanosis    The results of significant diagnostics from this hospitalization (including imaging, microbiology, ancillary and laboratory) are listed below for reference.     Microbiology: Recent Results (from the past 240 hour(s))  Resp Panel by RT-PCR (Flu A&B, Covid) Nasopharyngeal Swab     Status: None   Collection Time: 06/25/20  8:32 AM   Specimen: Nasopharyngeal  Swab; Nasopharyngeal(NP) swabs in vial transport medium  Result Value Ref Range Status   SARS Coronavirus 2 by RT PCR NEGATIVE NEGATIVE Final    Comment: (NOTE) SARS-CoV-2 target nucleic acids are NOT DETECTED.  The SARS-CoV-2 RNA is generally detectable in upper respiratory specimens during the acute phase of infection. The lowest concentration of SARS-CoV-2 viral copies this assay can detect is 138 copies/mL. A negative result does not preclude SARS-Cov-2 infection and should not be used as the sole basis for treatment or other patient management decisions. A negative result may occur with  improper specimen collection/handling, submission of specimen other than nasopharyngeal swab,  presence of viral mutation(s) within the areas targeted by this assay, and inadequate number of viral copies(<138 copies/mL). A negative result must be combined with clinical observations, patient history, and epidemiological information. The expected result is Negative.  Fact Sheet for Patients:  EntrepreneurPulse.com.au  Fact Sheet for Healthcare Providers:  IncredibleEmployment.be  This test is no t yet approved or cleared by the [place] FDA and  has been authorized for detection and/or diagnosis of SARS-CoV-2 by FDA under an Emergency Use Authorization (EUA). This EUA will remain  in effect (meaning this test can be used) for the duration of the COVID-19 declaration under Section 564(b)(1) of the Act, 21 U.S.C.section 360bbb-3(b)(1), unless the authorization is terminated  or revoked sooner.       Influenza A by PCR NEGATIVE NEGATIVE Final   Influenza B by PCR NEGATIVE NEGATIVE Final    Comment: (NOTE) The Xpert Xpress SARS-CoV-2/FLU/RSV plus assay is intended as an aid in the diagnosis of influenza from Nasopharyngeal swab specimens and should not be used as a sole basis for treatment. Nasal washings and aspirates are unacceptable for Xpert Xpress SARS-CoV-2/FLU/RSV testing.  Fact Sheet for Patients: EntrepreneurPulse.com.au  Fact Sheet for Healthcare Providers: IncredibleEmployment.be  This test is not yet approved or cleared by the [place] FDA and has been authorized for detection and/or diagnosis of SARS-CoV-2 by FDA under an Emergency Use Authorization (EUA). This EUA will remain in effect (meaning this test can be used) for the duration of the COVID-19 declaration under Section 564(b)(1) of the Act, 21 U.S.C. section 360bbb-3(b)(1), unless the authorization is terminated or revoked.  Performed at [facility], [place] [address], [place], [place] [address]      Labs: BNP  (last 3 results) Recent Labs    12/25/19 1638 12/31/19 2122 06/25/20 0456  BNP 63.9 116.8* 626.9*   Basic Metabolic Panel: Recent Labs  Lab 06/25/20 0456 06/28/20 0309  NA 133* 133*  K 4.0 4.0  CL 103 103  CO2 22 27  GLUCOSE 215* 214*  BUN 11 19  CREATININE 1.36* 1.88*  CALCIUM 8.3* 7.9*   Liver Function Tests: Recent Labs  Lab 06/25/20 0456 06/27/20 0301  AST 20 13*  ALT 13 11  ALKPHOS 102 81  BILITOT 0.6 <0.1*  PROT 6.0* 5.3*  ALBUMIN 1.9* 1.6*   Recent Labs  Lab 06/25/20 0456  LIPASE 30   No results for input(s): AMMONIA in the last 168 hours. CBC: Recent Labs  Lab 06/25/20 0538 06/27/20 0301  WBC 2.9* 2.9*  NEUTROABS 1.4*  --   HGB 11.0* 10.1*  HCT 35.0* 32.0*  MCV 78.5* 78.4*  PLT 218 211   Cardiac Enzymes: No results for input(s): CKTOTAL, CKMB, CKMBINDEX, TROPONINI in the last 168 hours. BNP: Invalid input(s): POCBNP CBG: Recent Labs  Lab 06/27/20 1418 06/27/20 1622 06/27/20 1833 06/27/20 2141 06/28/20 0740  GLUCAP 172* 205* 192* 173* 218*   D-Dimer No results for input(s): DDIMER in the last 72 hours. Hgb A1c Recent Labs    06/25/20 1314  HGBA1C 9.1*   Lipid Profile No results for input(s): CHOL, HDL, LDLCALC, TRIG, CHOLHDL, LDLDIRECT in the last 72 hours. Thyroid function studies Recent Labs    06/25/20 1244  TSH 1.714   Anemia work up No results for input(s): VITAMINB12, FOLATE, FERRITIN, TIBC, IRON, RETICCTPCT in the last 72 hours. Urinalysis    Component Value Date/Time   COLORURINE YELLOW 03/08/2020 1946   APPEARANCEUR TURBID (A) 03/08/2020 1946   APPEARANCEUR Clear 12/31/2013 2116   LABSPEC 1.016 03/08/2020 1946   LABSPEC 1.032 12/31/2013 2116   PHURINE 6.0 03/08/2020 1946   GLUCOSEU >=500 (A) 03/08/2020 1946   GLUCOSEU >=500 12/31/2013 2116   HGBUR NEGATIVE 03/08/2020 1946   BILIRUBINUR NEGATIVE 03/08/2020 1946   BILIRUBINUR Negative 12/31/2013 2116   KETONESUR NEGATIVE 03/08/2020 1946   PROTEINUR >=300  (A) 03/08/2020 1946   UROBILINOGEN 1.0 08/28/2013 1709   NITRITE NEGATIVE 03/08/2020 1946   LEUKOCYTESUR LARGE (A) 03/08/2020 1946   LEUKOCYTESUR Negative 12/31/2013 2116   Sepsis Labs Invalid input(s): PROCALCITONIN,  WBC,  LACTICIDVEN Microbiology Recent Results (from the past 240 hour(s))  Resp Panel by RT-PCR (Flu A&B, Covid) Nasopharyngeal Swab     Status: None   Collection Time: 06/25/20  8:32 AM   Specimen: Nasopharyngeal Swab; Nasopharyngeal(NP) swabs in vial transport medium  Result Value Ref Range Status   SARS Coronavirus 2 by RT PCR NEGATIVE NEGATIVE Final    Comment: (NOTE) SARS-CoV-2 target nucleic acids are NOT DETECTED.  The SARS-CoV-2 RNA is generally detectable in upper respiratory specimens during the acute phase of infection. The lowest concentration of SARS-CoV-2 viral copies this assay can detect is 138 copies/mL. A negative result does not preclude SARS-Cov-2 infection and should not be used as the sole basis for treatment or other patient management decisions. A negative result may occur with  improper specimen collection/handling, submission of specimen other than nasopharyngeal swab, presence of viral mutation(s) within the areas targeted by this assay, and inadequate number of viral copies(<138 copies/mL). A negative result must be combined with clinical observations, patient history, and epidemiological information. The expected result is Negative.  Fact Sheet for Patients:  EntrepreneurPulse.com.au  Fact Sheet for Healthcare Providers:  IncredibleEmployment.be  This test is no t yet approved or cleared by the [place] FDA and  has been authorized for detection and/or diagnosis of SARS-CoV-2 by FDA under an Emergency Use Authorization (EUA). This EUA will remain  in effect (meaning this test can be used) for the duration of the COVID-19 declaration under Section 564(b)(1) of the Act, 21 U.S.C.section  360bbb-3(b)(1), unless the authorization is terminated  or revoked sooner.       Influenza A by PCR NEGATIVE NEGATIVE Final   Influenza B by PCR NEGATIVE NEGATIVE Final    Comment: (NOTE) The Xpert Xpress SARS-CoV-2/FLU/RSV plus assay is intended as an aid in the diagnosis of influenza from Nasopharyngeal swab specimens and should not be used as a sole basis for treatment. Nasal washings and aspirates are unacceptable for Xpert Xpress SARS-CoV-2/FLU/RSV testing.  Fact Sheet for Patients: EntrepreneurPulse.com.au  Fact Sheet for Healthcare Providers: IncredibleEmployment.be  This test is not yet approved or cleared by the [place] FDA and has been authorized for detection and/or diagnosis of SARS-CoV-2 by FDA under an Emergency Use Authorization (EUA). This EUA will remain in effect (meaning this test  can be used) for the duration of the COVID-19 declaration under Section 564(b)(1) of the Act, 21 U.S.C. section 360bbb-3(b)(1), unless the authorization is terminated or revoked.  Performed at [facility], [place] [address], [place], [place] [address]      Time coordinating discharge: 34 minutes.  SIGNED:   Hosie Poisson, MD  [facility] 06/28/2020, 10:21 AM

## 2020-06-28 NOTE — NC FL2 (Signed)
[place] LEVEL OF CARE SCREENING TOOL     IDENTIFICATION  Patient Name: [name] Birthdate: 1960-03-04 Sex: male Admission Date (Current Location): 06/25/2020  [facility] and [place] Number:  Herbalist and Address:  [facility] [place], [place] [address], [place] [place] [employer], [place] [address]      Provider Number: [id number]  Attending Physician Name and Address:  Hosie Poisson, MD  Relative Name and Phone Number:  [name] [phone]    Current Level of Care: Hospital Recommended Level of Care: [place] ([facility]) Prior Approval Number:    Date Approved/Denied:   PASRR Number:    Discharge Plan: Other (Comment) ([facility])    Current Diagnoses: Patient Active Problem List   Diagnosis Date Noted  . Acute on chronic heart failure with preserved ejection fraction ([place])   . Chest pain 06/25/2020  . Pulmonary artery hypertension ([place]) 06/25/2020  . Chronic kidney disease, stage II (mild) 06/25/2020  . History of cerebrovascular accident (CVA) with residual deficit 06/25/2020  . Chronic kidney disease, stage 4 (severe) ([place]) 03/30/2020  . Morbid (severe) obesity due to excess calories ([place]) 03/30/2020  . Malnutrition of moderate degree 01/25/2020  . Palliative care by specialist   . Goals of care, counseling/discussion   . Nephrotic syndrome 01/06/2020  . Anasarca 01/06/2020  . Hypokalemia due to loss of potassium 01/04/2020  . Generalized body aches 01/01/2020  . Non-traumatic rhabdomyolysis 01/01/2020  . Grade I diastolic dysfunction 38/46/6599  . Microcytic anemia 01/01/2020  . Hypertensive urgency 12/31/2019  . Hypokalemia 12/31/2019  . Elevated CK   . Chronic diastolic CHF (congestive heart failure) ([place]) 12/31/2017  . Diabetes mellitus type 2, uncontrolled ([place])   . Pure hypercholesterolemia   . Hyperglycemia 09/28/2017  . Nonobstructive atherosclerosis of coronary artery 09/28/2017  . MDD (major  depressive disorder), recurrent episode, severe ([place]) 07/30/2017  . Osteoarthritis 04/12/2015  . Severe recurrent major depression with psychotic features ([place]) 01/04/2015  . Substance-related disorder ([place]) 10/30/2014  . Patient's noncompliance with other medical treatment and regimen 06/07/2014  . Cocaine dependence, uncomplicated ([place]) 35/70/1779  . Chronic pain 05/31/2014  . Cocaine abuse ([place]) 05/31/2014  . History of noncompliance with medical treatment 05/31/2014  . Malingering 05/31/2014  . Leukopenia 05/31/2014  . Diabetic neuropathy associated with diabetes mellitus due to underlying condition ([place]) 05/31/2014  . Diabetic neuropathy ([place]) 05/31/2014  . Congestive heart failure with left ventricular systolic dysfunction ([place]) 02/22/2014  . Depression, major, recurrent, severe with psychosis ([place]) 08/29/2013  . Suicidal ideations 08/28/2013  . Uncontrolled diabetes mellitus ([place]) 06/30/2013  . Atypical chest pain 12/09/2011  . Essential hypertension 12/09/2011  . Episodic mood disorder ([facility]) 05/13/2011    Orientation RESPIRATION BLADDER Height & Weight     Self,Time,Situation,Place (WDL)  Normal Continent Weight: 253 lb 12.8 oz (115.1 kg) Height:  5\' 8"  (172.7 cm)  BEHAVIORAL SYMPTOMS/MOOD NEUROLOGICAL BOWEL NUTRITION STATUS      Continent (WDL) Diet (NAS, no concentrated sweets)  AMBULATORY STATUS COMMUNICATION OF NEEDS Skin   Limited Assist Verbally Other (Comment) (Dry,Cracking,Leg,Foot,R,L)                       Personal Care Assistance Level of Assistance  Bathing,Feeding,Dressing Bathing Assistance: Limited assistance Feeding assistance: Independent (able to feed self) Dressing Assistance: Limited assistance     Functional Limitations Info  Sight,Hearing,Speech Sight Info: Adequate Hearing Info: Adequate Speech Info: Adequate    SPECIAL CARE  FACTORS FREQUENCY  PT (By licensed PT),OT (By licensed OT)     PT Frequency: 3x min weekly OT Frequency: 3x  min weekly            Contractures Contractures Info: Not present    Additional Factors Info  Code Status,Allergies,Insulin Sliding Scale Code Status Info: FULL Allergies Info: Other Allergy to GRITS , unknown exactly the specific ingredient he is allergic too   Insulin Sliding Scale Info: insulin aspart (novoLOG) injection 0-15 Units 3 times daily with meals,insulin glargine (LANTUS) injection 20 Units daily at bedtime       TAKE these medications   amLODipine 10 MG tablet Commonly known as: NORVASC Take 1 tablet (10 mg total) by mouth daily.   ammonium lactate 12 % lotion Commonly known as: LAC-HYDRIN Apply 1 application topically 2 (two) times daily. Both legs   atorvastatin 20 MG tablet Commonly known as: LIPITOR Take 1 tablet (20 mg total) by mouth daily.   feeding supplement Liqd Take 237 mLs by mouth 2 (two) times daily between meals.   furosemide 80 MG tablet Commonly known as: LASIX Take 0.5 tablets (40 mg total) by mouth daily. What changed: how much to take   insulin aspart 100 UNIT/ML injection Commonly known as: novoLOG Inject 12 Units into the skin 3 (three) times daily before meals. For blood sugars 0-150 give 0 units of insulin, 151-200 give 2 units of insulin, 201-250 give 4 units, 251-300 give 6 units, 301-350 give 8 units, 351-400 give 10 units,> 400 give 12 units and call M.D.   Lantus SoloStar 100 UNIT/ML Solostar Pen Generic drug: insulin glargine Inject 20 Units into the skin at bedtime.   polyethylene glycol 17 g packet Commonly known as: MIRALAX / GLYCOLAX Take 17 g by mouth daily as needed. What changed: when to take this   pregabalin 75 MG capsule Commonly known as: LYRICA Take 75 mg by mouth 2 (two) times daily.   sacubitril-valsartan 49-51 MG Commonly known as: ENTRESTO Take 1 tablet by mouth 2 (two) times daily.   sertraline 50 MG tablet Commonly known as: ZOLOFT Take 50 mg by mouth daily.   tamsulosin 0.4 MG  Caps capsule Commonly known as: FLOMAX Take 1 capsule (0.4 mg total) by mouth daily. What changed: how much to take     Relevant Imaging Results:  Relevant Lab Results:   Additional Information [id number]  Trula Ore, LCSWA

## 2020-06-28 NOTE — TOC Transition Note (Addendum)
Transition of Care [facility]) - CM/SW Discharge Note   Patient Details  Name: [name] MRN: [id number] Date of Birth: 05/22/1960  [facility] CM/SW Contact:  Trula Ore, [place] Phone Number: 06/28/2020, 11:45 AM   Clinical Narrative:     Patient will DC to: [facility]   Anticipated DC date: 06/28/2020  Family notified: Patient declined. Patient informed CSW he will call his brother [name].  Transport by: Corey Harold  ?  Per MD patient ready for DC to [facility] . RN patient, and facility notified of DC. Discharge [place], and covid result sent to facility. RN given number for report tele#[phone] [address] ask for [place].DC packet on chart. Ambulance transport requested for patient.  CSW signing off.       Final next level of care: Assisted Living ([facility]) Barriers to Discharge: No Barriers Identified   Patient Goals and CMS Choice Patient states their goals for this hospitalization and ongoing recovery are:: to go to ALF CMS Medicare.gov Compare Post Acute Care list provided to:: Patient Choice offered to / list presented to : Patient  Discharge Placement              Patient chooses bed at: [facility] ([facility]) Patient to be transferred to facility by: [facility] Name of family member notified: Patient declined. Patient reports he will tell his brother [name] Patient and family notified of of transfer: 06/28/20  Discharge Plan and Services In-house Referral: Clinical Social Work                                   Social Determinants of Health (SDOH) Interventions     Readmission Risk Interventions Readmission Risk Prevention Plan 01/12/2020  Transportation Screening Complete  Palliative Care Screening Not Applicable  Some recent data might be hidden

## 2020-06-28 NOTE — Progress Notes (Signed)
PT Cancellation Note  Patient Details Name: [name] MRN: [id number] DOB: [birth year]   Cancelled Treatment:    Reason Eval/Treat Not Completed: Patient declined, no reason specified. Pt declines PT twice on this date, initially due to low back pain. Upon 2nd attempt pt is dressed and prepared to discharge, declining PT services for a 2nd time.   [name] 06/28/2020, 12:53 PM

## 2020-06-28 NOTE — Progress Notes (Signed)
RN called [facility]. Report given to [facility]. D/c packet on chart ready for pickup by [facility]. Pickup ETA 13:30-14:00

## 2020-06-29 ENCOUNTER — Ambulatory Visit (INDEPENDENT_AMBULATORY_CARE_PROVIDER_SITE_OTHER): Payer: Medicare HMO | Admitting: Primary Care

## 2020-06-29 ENCOUNTER — Telehealth: Payer: Self-pay

## 2020-06-29 NOTE — Telephone Encounter (Signed)
Transition Care Management Follow-up Telephone Call  Date of discharge and from where: 06/28/2020, [facility]   How have you been since you were released from the hospital? He said that he has been home about 1.5 days and is having a hard time keeping anything, solids or liquids, in his stomach, he has vomited three times. He said that he told the staff at [facility] but they told him that they are not able to do anything except document the concern.  Informed him that Ms Oletta Lamas, NP would be notified   Any questions or concerns? Yes - noted above  Items Reviewed:  Did the pt receive and understand the discharge instructions provided? he said that he never saw them. They would have been given to the staff at [facility] where he resides in assisted living.   Medications obtained and verified? medications are managed by staff at the ALF.  They also monitor his blood sugars  Other? No   Any new allergies since your discharge? No   Do you have support at home? resides in [place] and Equipment/Supplies: Were home health services ordered? no If so, what is the name of the agency? n/a  Has the agency set up a time to come to the patient's home? not applicable Were any new equipment or medical supplies ordered?  No What is the name of the medical supply agency? n/a Were you able to get the supplies/equipment? not applicable Do you have any questions related to the use of the equipment or supplies? No   He would like home health PT ordered.  Functional Questionnaire: (I = Independent and D = Dependent) ADLs: some assistance needed with bathing. The staff at the facility manages his medications. He said that he primarily is wheelchair bound but he is able to transfer bed to chair independently.    Follow up appointments reviewed:   PCP Hospital f/u appt confirmed? Yes  - Juluis Mire, NP @ [facility]- 07/11/2020. He said that he was not aware of an appointment that he had  scheduled at [facility] today.  He never saw his AVS   [facility] f/u appt confirmed? Yes , cardiology - 07/06/2020.   Are transportation arrangements needed? No  - he said that he takes the bus. He also explained that he needs to provide some more information to [name] so he can use their transportation services.  This CM also explained that he can contact Medicaid to register for transportation to medical appointments  If their condition worsens, is the pt aware to call PCP or go to the Emergency Dept.? Yes  Was the patient provided with contact information for the PCP's office or ED? Yes, he has the phone number for [facility].   Was to pt encouraged to call back with questions or concerns? Yes

## 2020-06-30 ENCOUNTER — Encounter (HOSPITAL_COMMUNITY): Payer: Self-pay

## 2020-06-30 ENCOUNTER — Emergency Department (HOSPITAL_COMMUNITY): Payer: Medicare HMO

## 2020-06-30 ENCOUNTER — Other Ambulatory Visit: Payer: Self-pay

## 2020-06-30 ENCOUNTER — Inpatient Hospital Stay (HOSPITAL_COMMUNITY)
Admission: EM | Admit: 2020-06-30 | Discharge: 2020-07-05 | DRG: 303 | Disposition: A | Discharge status: Nursing Home (Includes ICF) | Payer: Medicare HMO | Attending: Internal Medicine | Admitting: Internal Medicine

## 2020-06-30 DIAGNOSIS — Z79899 Other long term (current) drug therapy: Secondary | ICD-10-CM

## 2020-06-30 DIAGNOSIS — M1990 Unspecified osteoarthritis, unspecified site: Secondary | ICD-10-CM | POA: Diagnosis present

## 2020-06-30 DIAGNOSIS — Z794 Long term (current) use of insulin: Secondary | ICD-10-CM

## 2020-06-30 DIAGNOSIS — Z20822 Contact with and (suspected) exposure to covid-19: Secondary | ICD-10-CM | POA: Diagnosis present

## 2020-06-30 DIAGNOSIS — R079 Chest pain, unspecified: Secondary | ICD-10-CM | POA: Diagnosis not present

## 2020-06-30 DIAGNOSIS — E669 Obesity, unspecified: Secondary | ICD-10-CM | POA: Diagnosis present

## 2020-06-30 DIAGNOSIS — I161 Hypertensive emergency: Secondary | ICD-10-CM | POA: Diagnosis not present

## 2020-06-30 DIAGNOSIS — Z6838 Body mass index (BMI) 38.0-38.9, adult: Secondary | ICD-10-CM

## 2020-06-30 DIAGNOSIS — R29703 NIHSS score 3: Secondary | ICD-10-CM | POA: Diagnosis present

## 2020-06-30 DIAGNOSIS — H538 Other visual disturbances: Secondary | ICD-10-CM | POA: Diagnosis not present

## 2020-06-30 DIAGNOSIS — F319 Bipolar disorder, unspecified: Secondary | ICD-10-CM | POA: Diagnosis present

## 2020-06-30 DIAGNOSIS — I313 Pericardial effusion (noninflammatory): Secondary | ICD-10-CM | POA: Diagnosis present

## 2020-06-30 DIAGNOSIS — R0602 Shortness of breath: Secondary | ICD-10-CM | POA: Diagnosis not present

## 2020-06-30 DIAGNOSIS — Z993 Dependence on wheelchair: Secondary | ICD-10-CM

## 2020-06-30 DIAGNOSIS — Z743 Need for continuous supervision: Secondary | ICD-10-CM | POA: Diagnosis not present

## 2020-06-30 DIAGNOSIS — R072 Precordial pain: Secondary | ICD-10-CM | POA: Diagnosis not present

## 2020-06-30 DIAGNOSIS — E1165 Type 2 diabetes mellitus with hyperglycemia: Secondary | ICD-10-CM | POA: Diagnosis not present

## 2020-06-30 DIAGNOSIS — E042 Nontoxic multinodular goiter: Secondary | ICD-10-CM | POA: Diagnosis present

## 2020-06-30 DIAGNOSIS — I5032 Chronic diastolic (congestive) heart failure: Secondary | ICD-10-CM | POA: Diagnosis present

## 2020-06-30 DIAGNOSIS — I700 Atherosclerosis of aorta: Secondary | ICD-10-CM | POA: Diagnosis present

## 2020-06-30 DIAGNOSIS — N184 Chronic kidney disease, stage 4 (severe): Secondary | ICD-10-CM | POA: Diagnosis present

## 2020-06-30 DIAGNOSIS — N189 Chronic kidney disease, unspecified: Secondary | ICD-10-CM | POA: Diagnosis present

## 2020-06-30 DIAGNOSIS — Z9109 Other allergy status, other than to drugs and biological substances: Secondary | ICD-10-CM

## 2020-06-30 DIAGNOSIS — I517 Cardiomegaly: Secondary | ICD-10-CM | POA: Diagnosis not present

## 2020-06-30 DIAGNOSIS — I10 Essential (primary) hypertension: Secondary | ICD-10-CM | POA: Diagnosis not present

## 2020-06-30 DIAGNOSIS — R1013 Epigastric pain: Secondary | ICD-10-CM | POA: Diagnosis not present

## 2020-06-30 DIAGNOSIS — D509 Iron deficiency anemia, unspecified: Secondary | ICD-10-CM | POA: Diagnosis present

## 2020-06-30 DIAGNOSIS — Q438 Other specified congenital malformations of intestine: Secondary | ICD-10-CM

## 2020-06-30 DIAGNOSIS — I959 Hypotension, unspecified: Secondary | ICD-10-CM | POA: Diagnosis not present

## 2020-06-30 DIAGNOSIS — N182 Chronic kidney disease, stage 2 (mild): Secondary | ICD-10-CM | POA: Diagnosis present

## 2020-06-30 DIAGNOSIS — R471 Dysarthria and anarthria: Secondary | ICD-10-CM | POA: Diagnosis not present

## 2020-06-30 DIAGNOSIS — I130 Hypertensive heart and chronic kidney disease with heart failure and stage 1 through stage 4 chronic kidney disease, or unspecified chronic kidney disease: Secondary | ICD-10-CM | POA: Diagnosis present

## 2020-06-30 DIAGNOSIS — D631 Anemia in chronic kidney disease: Secondary | ICD-10-CM | POA: Diagnosis present

## 2020-06-30 DIAGNOSIS — I25118 Atherosclerotic heart disease of native coronary artery with other forms of angina pectoris: Secondary | ICD-10-CM | POA: Diagnosis not present

## 2020-06-30 DIAGNOSIS — Z87891 Personal history of nicotine dependence: Secondary | ICD-10-CM

## 2020-06-30 DIAGNOSIS — E1122 Type 2 diabetes mellitus with diabetic chronic kidney disease: Secondary | ICD-10-CM | POA: Diagnosis present

## 2020-06-30 DIAGNOSIS — D72819 Decreased white blood cell count, unspecified: Secondary | ICD-10-CM | POA: Diagnosis present

## 2020-06-30 DIAGNOSIS — E785 Hyperlipidemia, unspecified: Secondary | ICD-10-CM | POA: Diagnosis present

## 2020-06-30 DIAGNOSIS — I69351 Hemiplegia and hemiparesis following cerebral infarction affecting right dominant side: Secondary | ICD-10-CM

## 2020-06-30 DIAGNOSIS — R1110 Vomiting, unspecified: Secondary | ICD-10-CM | POA: Diagnosis present

## 2020-06-30 DIAGNOSIS — E1151 Type 2 diabetes mellitus with diabetic peripheral angiopathy without gangrene: Secondary | ICD-10-CM | POA: Diagnosis present

## 2020-06-30 DIAGNOSIS — D469 Myelodysplastic syndrome, unspecified: Secondary | ICD-10-CM | POA: Diagnosis present

## 2020-06-30 DIAGNOSIS — R10819 Abdominal tenderness, unspecified site: Secondary | ICD-10-CM | POA: Diagnosis present

## 2020-06-30 DIAGNOSIS — T463X5A Adverse effect of coronary vasodilators, initial encounter: Secondary | ICD-10-CM | POA: Diagnosis not present

## 2020-06-30 DIAGNOSIS — G459 Transient cerebral ischemic attack, unspecified: Secondary | ICD-10-CM | POA: Diagnosis present

## 2020-06-30 DIAGNOSIS — E8809 Other disorders of plasma-protein metabolism, not elsewhere classified: Secondary | ICD-10-CM | POA: Diagnosis present

## 2020-06-30 DIAGNOSIS — IMO0002 Reserved for concepts with insufficient information to code with codable children: Secondary | ICD-10-CM | POA: Diagnosis present

## 2020-06-30 DIAGNOSIS — F1410 Cocaine abuse, uncomplicated: Secondary | ICD-10-CM | POA: Diagnosis present

## 2020-06-30 DIAGNOSIS — Z8249 Family history of ischemic heart disease and other diseases of the circulatory system: Secondary | ICD-10-CM

## 2020-06-30 DIAGNOSIS — R0789 Other chest pain: Secondary | ICD-10-CM | POA: Diagnosis not present

## 2020-06-30 DIAGNOSIS — I2721 Secondary pulmonary arterial hypertension: Secondary | ICD-10-CM | POA: Diagnosis present

## 2020-06-30 DIAGNOSIS — Z809 Family history of malignant neoplasm, unspecified: Secondary | ICD-10-CM

## 2020-06-30 DIAGNOSIS — E871 Hypo-osmolality and hyponatremia: Secondary | ICD-10-CM | POA: Diagnosis present

## 2020-06-30 DIAGNOSIS — Z833 Family history of diabetes mellitus: Secondary | ICD-10-CM

## 2020-06-30 DIAGNOSIS — Z823 Family history of stroke: Secondary | ICD-10-CM

## 2020-06-30 DIAGNOSIS — D61818 Other pancytopenia: Secondary | ICD-10-CM | POA: Diagnosis present

## 2020-06-30 DIAGNOSIS — R9431 Abnormal electrocardiogram [ECG] [EKG]: Secondary | ICD-10-CM | POA: Diagnosis not present

## 2020-06-30 DIAGNOSIS — I160 Hypertensive urgency: Secondary | ICD-10-CM | POA: Diagnosis present

## 2020-06-30 HISTORY — DX: Claustrophobia: F40.240

## 2020-06-30 HISTORY — DX: Cardiac murmur, unspecified: R01.1

## 2020-06-30 LAB — COMPREHENSIVE METABOLIC PANEL
ALT: 10 U/L (ref 0–44)
AST: 15 U/L (ref 15–41)
Albumin: 1.8 g/dL — ABNORMAL LOW (ref 3.5–5.0)
Alkaline Phosphatase: 76 U/L (ref 38–126)
Anion gap: 4 — ABNORMAL LOW (ref 5–15)
BUN: 17 mg/dL (ref 6–20)
CO2: 25 mmol/L (ref 22–32)
Calcium: 8.4 mg/dL — ABNORMAL LOW (ref 8.9–10.3)
Chloride: 106 mmol/L (ref 98–111)
Creatinine, Ser: 1.52 mg/dL — ABNORMAL HIGH (ref 0.61–1.24)
GFR, Estimated: 53 mL/min — ABNORMAL LOW (ref >60)
Glucose, Bld: 170 mg/dL — ABNORMAL HIGH (ref 70–99)
Potassium: 3.9 mmol/L (ref 3.5–5.1)
Sodium: 135 mmol/L (ref 135–145)
Total Bilirubin: 0.5 mg/dL (ref 0.3–1.2)
Total Protein: 5.7 g/dL — ABNORMAL LOW (ref 6.5–8.1)

## 2020-06-30 LAB — BRAIN NATRIURETIC PEPTIDE: B Natriuretic Peptide: 77.6 pg/mL (ref 0.0–100.0)

## 2020-06-30 LAB — CBC
HCT: 37.4 % — ABNORMAL LOW (ref 39.0–52.0)
Hemoglobin: 11.5 g/dL — ABNORMAL LOW (ref 13.0–17.0)
MCH: 24.9 pg — ABNORMAL LOW (ref 26.0–34.0)
MCHC: 30.7 g/dL (ref 30.0–36.0)
MCV: 81 fL (ref 80.0–100.0)
Platelets: 260 10*3/uL (ref 150–400)
RBC: 4.62 MIL/uL (ref 4.22–5.81)
RDW: 14.1 % (ref 11.5–15.5)
WBC: 3.5 10*3/uL — ABNORMAL LOW (ref 4.0–10.5)
nRBC: 0 % (ref 0.0–0.2)

## 2020-06-30 LAB — SARS CORONAVIRUS 2 (TAT 6-24 HRS): SARS Coronavirus 2: NEGATIVE

## 2020-06-30 LAB — PROCALCITONIN: Procalcitonin: <0.1 ng/mL

## 2020-06-30 LAB — RAPID URINE DRUG SCREEN, HOSP PERFORMED
Amphetamines: NOT DETECTED
Barbiturates: NOT DETECTED
Benzodiazepines: NOT DETECTED
Cocaine: NOT DETECTED
Opiates: NOT DETECTED
Tetrahydrocannabinol: NOT DETECTED

## 2020-06-30 LAB — TROPONIN I (HIGH SENSITIVITY)
Troponin I (High Sensitivity): 19 ng/L — ABNORMAL HIGH (ref <18)
Troponin I (High Sensitivity): 21 ng/L — ABNORMAL HIGH (ref <18)
Troponin I (High Sensitivity): 18 ng/L — ABNORMAL HIGH (ref <18)
Troponin I (High Sensitivity): 17 ng/L (ref <18)

## 2020-06-30 LAB — LIPASE, BLOOD: Lipase: 33 U/L (ref 11–51)

## 2020-06-30 MED ORDER — SACUBITRIL-VALSARTAN 49-51 MG PO TABS
1.0000 | ORAL_TABLET | Freq: Once | ORAL | Status: AC
  Administered 2020-06-30: 1 via ORAL
  Filled 2020-06-30: qty 1

## 2020-06-30 MED ORDER — PANTOPRAZOLE SODIUM 40 MG PO TBEC
40.0000 mg | DELAYED_RELEASE_TABLET | Freq: Every day | ORAL | Status: DC
  Administered 2020-07-01 – 2020-07-05 (×5): 40 mg via ORAL
  Filled 2020-06-30 (×5): qty 1

## 2020-06-30 MED ORDER — ATORVASTATIN CALCIUM 40 MG PO TABS
40.0000 mg | ORAL_TABLET | Freq: Every day | ORAL | Status: DC
  Administered 2020-07-01 – 2020-07-02 (×2): 40 mg via ORAL
  Filled 2020-06-30 (×2): qty 1

## 2020-06-30 MED ORDER — AMLODIPINE BESYLATE 10 MG PO TABS
10.0000 mg | ORAL_TABLET | Freq: Every day | ORAL | Status: DC
  Administered 2020-07-01 – 2020-07-03 (×3): 10 mg via ORAL
  Filled 2020-06-30 (×3): qty 1

## 2020-06-30 MED ORDER — PREGABALIN 75 MG PO CAPS
75.0000 mg | ORAL_CAPSULE | Freq: Two times a day (BID) | ORAL | Status: DC
  Administered 2020-06-30 – 2020-07-05 (×10): 75 mg via ORAL
  Filled 2020-06-30 (×10): qty 1

## 2020-06-30 MED ORDER — SERTRALINE HCL 50 MG PO TABS
50.0000 mg | ORAL_TABLET | Freq: Every day | ORAL | Status: DC
  Administered 2020-07-04: 50 mg via ORAL
  Filled 2020-06-30 (×5): qty 1

## 2020-06-30 MED ORDER — POLYETHYLENE GLYCOL 3350 17 G PO PACK
17.0000 g | PACK | Freq: Two times a day (BID) | ORAL | Status: DC
  Administered 2020-07-05: 17 g via ORAL
  Filled 2020-06-30 (×7): qty 1

## 2020-06-30 MED ORDER — SENNOSIDES-DOCUSATE SODIUM 8.6-50 MG PO TABS
1.0000 | ORAL_TABLET | Freq: Two times a day (BID) | ORAL | Status: DC
  Administered 2020-06-30 – 2020-07-05 (×9): 1 via ORAL
  Filled 2020-06-30 (×9): qty 1

## 2020-06-30 MED ORDER — CARVEDILOL 6.25 MG PO TABS
6.2500 mg | ORAL_TABLET | Freq: Two times a day (BID) | ORAL | Status: DC
  Administered 2020-07-01 – 2020-07-03 (×5): 6.25 mg via ORAL
  Filled 2020-06-30 (×5): qty 1

## 2020-06-30 MED ORDER — HEPARIN SODIUM (PORCINE) 5000 UNIT/ML IJ SOLN
5000.0000 [IU] | Freq: Three times a day (TID) | INTRAMUSCULAR | Status: DC
  Administered 2020-06-30 – 2020-07-05 (×15): 5000 [IU] via SUBCUTANEOUS
  Filled 2020-06-30 (×15): qty 1

## 2020-06-30 MED ORDER — ACETAMINOPHEN 325 MG PO TABS
650.0000 mg | ORAL_TABLET | ORAL | Status: DC | PRN
  Administered 2020-06-30: 650 mg via ORAL
  Filled 2020-06-30: qty 2

## 2020-06-30 MED ORDER — AMLODIPINE BESYLATE 5 MG PO TABS
10.0000 mg | ORAL_TABLET | Freq: Once | ORAL | Status: AC
  Administered 2020-06-30: 10 mg via ORAL
  Filled 2020-06-30: qty 2

## 2020-06-30 MED ORDER — TAMSULOSIN HCL 0.4 MG PO CAPS
0.8000 mg | ORAL_CAPSULE | Freq: Every day | ORAL | Status: DC
  Administered 2020-07-01 – 2020-07-05 (×5): 0.8 mg via ORAL
  Filled 2020-06-30 (×5): qty 2

## 2020-06-30 MED ORDER — ATORVASTATIN CALCIUM 10 MG PO TABS: 20.0000 mg | ORAL_TABLET | Freq: Every day | ORAL | Status: DC

## 2020-06-30 MED ORDER — FENTANYL CITRATE (PF) 100 MCG/2ML IJ SOLN
50.0000 ug | Freq: Once | INTRAMUSCULAR | Status: AC
  Administered 2020-06-30: 50 ug via INTRAVENOUS
  Filled 2020-06-30: qty 2

## 2020-06-30 MED ORDER — SACUBITRIL-VALSARTAN 49-51 MG PO TABS
1.0000 | ORAL_TABLET | Freq: Two times a day (BID) | ORAL | Status: DC
  Administered 2020-06-30 – 2020-07-02 (×5): 1 via ORAL
  Filled 2020-06-30 (×7): qty 1

## 2020-06-30 MED ORDER — ONDANSETRON HCL 4 MG/2ML IJ SOLN
4.0000 mg | Freq: Four times a day (QID) | INTRAMUSCULAR | Status: DC | PRN
  Administered 2020-07-01: 4 mg via INTRAVENOUS
  Filled 2020-06-30: qty 2

## 2020-06-30 MED ORDER — ASPIRIN 81 MG PO CHEW
81.0000 mg | CHEWABLE_TABLET | Freq: Every day | ORAL | Status: DC
  Administered 2020-06-30 – 2020-07-05 (×6): 81 mg via ORAL
  Filled 2020-06-30 (×6): qty 1

## 2020-06-30 NOTE — H&P (Addendum)
History and Physical    [name] UMP:[id number] DOB: March 30, 1960 DOA: 06/30/2020  I have briefly reviewed the patient's prior medical records in [place]  PCP: [name], NP  Patient coming from: [facility]  Chief Complaint: chest discomfort/throwing up  HPI: [name] is a 60 y.o. male with medical history significant DM/HTN, he had a recent hospitalization with stress test and echo.  Went back to ALF and then had continued chest pain and at some point vomiting after dinner.  Patient is difficult to get a clear picture of timing from.  ER MD spoke with Dr. Terri Skains who recommends that patient get heart cath Monday.   Denies fever, chills.  Does have some epigastric abdominal pain but is also demanding to eat.    Pain not worse with eating nor taking deep breath but he does say he feels SOB-- at rest.     Review of Systems: As per HPI otherwise 10 point review of systems negative.   Past Medical History:  Diagnosis Date  . Arthritis   . Bipolar affective ([place])   . CHF (congestive heart failure) ([place])   . Depression   . Diabetes mellitus    uncontrolled   . Hypertension     Past Surgical History:  Procedure Laterality Date  . BOWEL DECOMPRESSION N/A 01/19/2020   Procedure: BOWEL DECOMPRESSION;  Surgeon: Otis Brace, MD;  Location: [place];  Service: Gastroenterology;  Laterality: N/A;  . COLONOSCOPY WITH PROPOFOL N/A 01/19/2020   Procedure: COLONOSCOPY WITH PROPOFOL;  Surgeon: Otis Brace, MD;  Location: [place];  Service: Gastroenterology;  Laterality: N/A;  . KNEE SURGERY     b/l knees; pending total knee      reports that he has quit smoking. He has never used smokeless tobacco. He reports current drug use. Drug: Cocaine. He reports that he does not drink alcohol.  Allergies  Allergen Reactions  . Other Anaphylaxis, Itching and Swelling    Allergy to GRITS , unknown exactly the specific ingredient he is allergic too     Family History  Problem Relation Age of Onset  . Diabetes Other   . Cancer Mother        uterus; deceased   . Heart attack Mother 26  . Hypertension Other   . Heart attack Father 33  . Stroke Sister        12/2012 age 37 y.o    Prior to Admission medications   Medication Sig Start Date End Date Taking? Authorizing Provider  amLODipine (NORVASC) 10 MG tablet Take 1 tablet (10 mg total) by mouth daily. 03/30/20 04/29/20  [name], NP  ammonium lactate (LAC-HYDRIN) 12 % lotion Apply 1 application topically 2 (two) times daily. Both legs 05/19/20   [provider]  atorvastatin (LIPITOR) 20 MG tablet Take 1 tablet (20 mg total) by mouth daily. 06/28/20   Hosie Poisson, MD  feeding supplement (ENSURE ENLIVE / ENSURE PLUS) LIQD Take 237 mLs by mouth 2 (two) times daily between meals. 06/28/20 09/26/20  Hosie Poisson, MD  furosemide (LASIX) 80 MG tablet Take 0.5 tablets (40 mg total) by mouth daily. 06/28/20 09/26/20  Hosie Poisson, MD  insulin aspart (NOVOLOG) 100 UNIT/ML injection Inject 12 Units into the skin 3 (three) times daily before meals. For blood sugars 0-150 give 0 units of insulin, 151-200 give 2 units of insulin, 201-250 give 4 units, 251-300 give 6 units, 301-350 give 8 units, 351-400 give 10 units,> 400 give 12  units and call M.D. Patient not taking: Reported on 06/26/2020 03/30/20   [name], NP  insulin glargine (LANTUS SOLOSTAR) 100 UNIT/ML Solostar Pen Inject 20 Units into the skin at bedtime. 03/30/20   [name], NP  polyethylene glycol (MIRALAX / GLYCOLAX) 17 g packet Take 17 g by mouth daily as needed. Patient taking differently: Take 17 g by mouth daily. 03/30/20   [name], NP  pregabalin (LYRICA) 75 MG capsule Take 75 mg by mouth 2 (two) times daily. 05/19/20   [provider]  sacubitril-valsartan (ENTRESTO) 49-51 MG Take 1 tablet by mouth 2 (two) times daily. 06/28/20   Hosie Poisson, MD  sertraline (ZOLOFT) 50 MG tablet Take 50  mg by mouth daily. 05/29/20   [provider]  tamsulosin (FLOMAX) 0.4 MG CAPS capsule Take 1 capsule (0.4 mg total) by mouth daily. Patient taking differently: Take 0.8 mg by mouth daily. 03/30/20   [name], NP    Physical Exam: Vitals:   06/30/20 1045 06/30/20 1100 06/30/20 1115 06/30/20 1130  BP:  (!) 166/103    Pulse: 82 83 82 87  Resp: 11 11 11 19   Temp:      TempSrc:      SpO2: 100% 98% 97% 99%  Weight:      Height:          Constitutional: NAD, calm, comfortable Eyes: PERRL, lids and conjunctivae normal ENMT: Mucous membranes are moist. Posterior pharynx clear of any exudate or lesions. Neck: normal, supple, no masses, no thyromegaly Respiratory: clear to auscultation bilaterally, no wheezing, no crackles. Normal respiratory effort. No accessory muscle use.  Cardiovascular: Regular rate and rhythm, no murmurs / rubs / gallops. No extremity edema. 2+ pedal pulses.  Abdomen: no tenderness, no masses palpated. Bowel sounds positive.  Musculoskeletal: no clubbing / cyanosis. Normal muscle tone.  Skin: chronic skin changes in B/L LE Psychiatric: poor historian  Labs on Admission: I have personally reviewed following labs and imaging studies  CBC: Recent Labs  Lab 06/25/20 0538 06/27/20 0301 06/30/20 0040  WBC 2.9* 2.9* 3.5*  NEUTROABS 1.4*  --   --   HGB 11.0* 10.1* 11.5*  HCT 35.0* 32.0* 37.4*  MCV 78.5* 78.4* 81.0  PLT 218 211 462   Basic Metabolic Panel: Recent Labs  Lab 06/25/20 0456 06/28/20 0309 06/30/20 0039  NA 133* 133* 135  K 4.0 4.0 3.9  CL 103 103 106  CO2 22 27 25   GLUCOSE 215* 214* 170*  BUN 11 19 17   CREATININE 1.36* 1.88* 1.52*  CALCIUM 8.3* 7.9* 8.4*   GFR: Estimated Creatinine Clearance: 65.2 mL/min (A) (by C-G formula based on SCr of 1.52 mg/dL (H)). Liver Function Tests: Recent Labs  Lab 06/25/20 0456 06/27/20 0301 06/30/20 0039  AST 20 13* 15  ALT 13 11 10   ALKPHOS 102 81 76  BILITOT 0.6 <0.1* 0.5  PROT  6.0* 5.3* 5.7*  ALBUMIN 1.9* 1.6* 1.8*   Recent Labs  Lab 06/25/20 0456 06/30/20 0040  LIPASE 30 33   No results for input(s): AMMONIA in the last 168 hours. Coagulation Profile: No results for input(s): INR, PROTIME in the last 168 hours. Cardiac Enzymes: No results for input(s): CKTOTAL, CKMB, CKMBINDEX, TROPONINI in the last 168 hours. BNP (last 3 results) No results for input(s): PROBNP in the last 8760 hours. HbA1C: No results for input(s): HGBA1C in the last 72 hours. CBG: Recent Labs  Lab 06/27/20 1622 06/27/20 1833 06/27/20 2141 06/28/20 0740 06/28/20  1148  GLUCAP 205* 192* 173* 218* 150*   Lipid Profile: No results for input(s): CHOL, HDL, LDLCALC, TRIG, CHOLHDL, LDLDIRECT in the last 72 hours. Thyroid Function Tests: No results for input(s): TSH, T4TOTAL, FREET4, T3FREE, THYROIDAB in the last 72 hours. Anemia Panel: No results for input(s): VITAMINB12, FOLATE, FERRITIN, TIBC, IRON, RETICCTPCT in the last 72 hours. Urine analysis:    Component Value Date/Time   COLORURINE YELLOW 03/08/2020 1946   APPEARANCEUR TURBID (A) 03/08/2020 1946   APPEARANCEUR Clear 12/31/2013 2116   LABSPEC 1.016 03/08/2020 1946   LABSPEC 1.032 12/31/2013 2116   PHURINE 6.0 03/08/2020 1946   GLUCOSEU >=500 (A) 03/08/2020 1946   GLUCOSEU >=500 12/31/2013 2116   HGBUR NEGATIVE 03/08/2020 1946   BILIRUBINUR NEGATIVE 03/08/2020 1946   BILIRUBINUR Negative 12/31/2013 2116   KETONESUR NEGATIVE 03/08/2020 1946   PROTEINUR >=300 (A) 03/08/2020 1946   UROBILINOGEN 1.0 08/28/2013 1709   NITRITE NEGATIVE 03/08/2020 1946   LEUKOCYTESUR LARGE (A) 03/08/2020 1946   LEUKOCYTESUR Negative 12/31/2013 2116     Radiological Exams on Admission: DG Chest 2 View  Result Date: 06/30/2020 CLINICAL DATA:  Chest pain. EXAM: CHEST - 2 VIEW COMPARISON:  Jun 25, 2020 FINDINGS: The cardiac silhouette is mildly enlarged. Both lungs are clear. The visualized skeletal structures are unremarkable. IMPRESSION:  Stable cardiomegaly without active cardiopulmonary disease. Electronically Signed   By: Virgina Norfolk M.D.   On: 06/30/2020 01:27      Assessment/Plan Active Problems:   Leukopenia   Diabetes mellitus type 2, uncontrolled (HCC)   Hypertensive urgency   Chest pain   CKD (chronic kidney disease)   Chest pain -d/c'd 2 days ago for same- has ST and echo at that time which were unremarkable -CE negative -ER MD spoke with Dr. Terri Skains who recommends cath on Monday -check pro calcitonin (CTA last time had some ? Infiltrates although no fever) - add incentive spirometry -lipase negative -will get GI eval for possible upper GI prior to cath  Constipation -aggressive bowel regimen  CKD -unclear stage-- appears to be stage IIIa but will need close monitoring and limited dye with cath  DM type 2 -SSI for now -carb mod diet  HTN -BP on higher side -resume home meds and adjust    DVT prophylaxis: heparin Code Status: full (had been DNR in past so may need to revisit) Consults called: cards From ALF   Admission status: obs  At the point of initial evaluation, it is my clinical opinion that admission for OBSERVATION is reasonable and necessary because the patient's presenting complaints in the context of their chronic conditions represent sufficient risk of deterioration or significant morbidity to constitute reasonable grounds for close observation in the hospital setting, but that the patient may be medically stable for discharge from the hospital within 24 to 48 hours.    Geradine Girt [facility]   How to contact the [facility] Attending or Consulting provider [place] or covering provider during after hours [place], for this patient?  1. Check the care team in [facility] and look for a) attending/consulting TR[facility] provider listed and b) the [facility] team listed 2. Log into [email or web address] and use [facility]'s universal password to access. If you do not have the password, please contact  the hospital operator. 3. Locate the [facility] provider you are looking for under [facility] and page to a number that you can be directly reached. 4. If you still have difficulty reaching the provider, please page the [facility] (  Director on Call) for the Hospitalists listed on amion for assistance.  06/30/2020, 12:46 PM

## 2020-06-30 NOTE — ED Notes (Signed)
Nurse report given to Clarise Cruz, [place]

## 2020-06-30 NOTE — ED Provider Notes (Signed)
[facility] EMERGENCY DEPARTMENT Provider Note   CSN: [id number] Arrival date & time: 06/30/20  0027     History Chief Complaint  Patient presents with  . Chest Pain  . Shortness of Breath    [name] is a 60 y.o. male.  Presented to the emergency room with concern for chest pain, shortness of breath.  Patient reports that pain has been ongoing for the past week.  Was relatively constant while he was in the hospital but had resolved prior to discharge.  States he was asymptomatic at time of discharge but then developed pain yesterday again.  Pain continues to be relatively constant central, pressure.  Nonradiating.  Some improvement with nitro.  Has a history of heart failure, pulmonary artery hypertension, CKD, CVA, CAD.  Recent admission for chest pain, negative stress test and echo stable without regional wall motion abnormality  HPI     Past Medical History:  Diagnosis Date  . Arthritis   . Bipolar affective ([place])   . CHF (congestive heart failure) ([place])   . Depression   . Diabetes mellitus    uncontrolled   . Hypertension     Patient Active Problem List   Diagnosis Date Noted  . Acute on chronic heart failure with preserved ejection fraction ([place])   . Chest pain 06/25/2020  . Pulmonary artery hypertension ([place]) 06/25/2020  . Chronic kidney disease, stage II (mild) 06/25/2020  . History of cerebrovascular accident (CVA) with residual deficit 06/25/2020  . Chronic kidney disease, stage 4 (severe) ([place]) 03/30/2020  . Morbid (severe) obesity due to excess calories ([place]) 03/30/2020  . Malnutrition of moderate degree 01/25/2020  . Palliative care by specialist   . Goals of care, counseling/discussion   . Nephrotic syndrome 01/06/2020  . Anasarca 01/06/2020  . Hypokalemia due to loss of potassium 01/04/2020  . Generalized body aches 01/01/2020  . Non-traumatic rhabdomyolysis 01/01/2020  . Grade I diastolic dysfunction 27/07/2374  . Microcytic  anemia 01/01/2020  . Hypertensive urgency 12/31/2019  . Hypokalemia 12/31/2019  . Elevated CK   . Chronic diastolic CHF (congestive heart failure) ([place]) 12/31/2017  . Diabetes mellitus type 2, uncontrolled ([place])   . Pure hypercholesterolemia   . Hyperglycemia 09/28/2017  . Nonobstructive atherosclerosis of coronary artery 09/28/2017  . MDD (major depressive disorder), recurrent episode, severe ([place]) 07/30/2017  . Osteoarthritis 04/12/2015  . Severe recurrent major depression with psychotic features ([place]) 01/04/2015  . Substance-related disorder ([place]) 10/30/2014  . Patient's noncompliance with other medical treatment and regimen 06/07/2014  . Cocaine dependence, uncomplicated ([place]) 28/31/5176  . Chronic pain 05/31/2014  . Cocaine abuse ([place]) 05/31/2014  . History of noncompliance with medical treatment 05/31/2014  . Malingering 05/31/2014  . Leukopenia 05/31/2014  . Diabetic neuropathy associated with diabetes mellitus due to underlying condition ([place]) 05/31/2014  . Diabetic neuropathy ([place]) 05/31/2014  . Congestive heart failure with left ventricular systolic dysfunction ([place]) 02/22/2014  . Depression, major, recurrent, severe with psychosis ([place]) 08/29/2013  . Suicidal ideations 08/28/2013  . Uncontrolled diabetes mellitus ([place]) 06/30/2013  . Atypical chest pain 12/09/2011  . Essential hypertension 12/09/2011  . Episodic mood disorder ([place]) 05/13/2011    Past Surgical History:  Procedure Laterality Date  . BOWEL DECOMPRESSION N/A 01/19/2020   Procedure: BOWEL DECOMPRESSION;  Surgeon: Otis Brace, MD;  Location: [place];  Service: Gastroenterology;  Laterality: N/A;  . COLONOSCOPY WITH PROPOFOL N/A 01/19/2020   Procedure: COLONOSCOPY WITH PROPOFOL;  Surgeon: Otis Brace, MD;  Location: [facility];  Service: Gastroenterology;  Laterality: N/A;  . KNEE SURGERY     b/l knees; pending total knee        Family History  Problem Relation Age of Onset  .  Diabetes Other   . Cancer Mother        uterus; deceased   . Heart attack Mother 30  . Hypertension Other   . Heart attack Father 34  . Stroke Sister        12/2012 age 80 y.o    Social History   Tobacco Use  . Smoking status: Former Research scientist (life sciences)  . Smokeless tobacco: Never Used  Vaping Use  . Vaping Use: Never used  Substance Use Topics  . Alcohol use: No  . Drug use: Yes    Types: Cocaine    Home Medications Prior to Admission medications   Medication Sig Start Date End Date Taking? Authorizing Provider  amLODipine (NORVASC) 10 MG tablet Take 1 tablet (10 mg total) by mouth daily. 03/30/20 04/29/20  [name], NP  ammonium lactate (LAC-HYDRIN) 12 % lotion Apply 1 application topically 2 (two) times daily. Both legs 05/19/20   [provider]  atorvastatin (LIPITOR) 20 MG tablet Take 1 tablet (20 mg total) by mouth daily. 06/28/20   Hosie Poisson, MD  feeding supplement (ENSURE ENLIVE / ENSURE PLUS) LIQD Take 237 mLs by mouth 2 (two) times daily between meals. 06/28/20 09/26/20  Hosie Poisson, MD  furosemide (LASIX) 80 MG tablet Take 0.5 tablets (40 mg total) by mouth daily. 06/28/20 09/26/20  Hosie Poisson, MD  insulin aspart (NOVOLOG) 100 UNIT/ML injection Inject 12 Units into the skin 3 (three) times daily before meals. For blood sugars 0-150 give 0 units of insulin, 151-200 give 2 units of insulin, 201-250 give 4 units, 251-300 give 6 units, 301-350 give 8 units, 351-400 give 10 units,> 400 give 12 units and call M.D. Patient not taking: Reported on 06/26/2020 03/30/20   [name], NP  insulin glargine (LANTUS SOLOSTAR) 100 UNIT/ML Solostar Pen Inject 20 Units into the skin at bedtime. 03/30/20   [name], NP  polyethylene glycol (MIRALAX / GLYCOLAX) 17 g packet Take 17 g by mouth daily as needed. Patient taking differently: Take 17 g by mouth daily. 03/30/20   [name], NP  pregabalin (LYRICA) 75 MG capsule Take 75 mg by mouth 2 (two) times daily.  05/19/20   [provider]  sacubitril-valsartan (ENTRESTO) 49-51 MG Take 1 tablet by mouth 2 (two) times daily. 06/28/20   Hosie Poisson, MD  sertraline (ZOLOFT) 50 MG tablet Take 50 mg by mouth daily. 05/29/20   [provider]  tamsulosin (FLOMAX) 0.4 MG CAPS capsule Take 1 capsule (0.4 mg total) by mouth daily. Patient taking differently: Take 0.8 mg by mouth daily. 03/30/20   [name], NP    Allergies    Other  Review of Systems   Review of Systems  Constitutional: Negative for chills and fever.  HENT: Negative for ear pain and sore throat.   Eyes: Negative for pain and visual disturbance.  Respiratory: Positive for chest tightness and shortness of breath.   Cardiovascular: Positive for chest pain. Negative for palpitations.  Gastrointestinal: Negative for abdominal pain and vomiting.  Genitourinary: Negative for dysuria and hematuria.  Musculoskeletal: Negative for arthralgias and back pain.  Skin: Negative for color change and rash.  Neurological: Negative for seizures and syncope.  All other systems reviewed and are negative.    Physical Exam Updated  Vital Signs BP (!) 173/103   Pulse 84   Temp (!) 97.5 F (36.4 C) (Oral)   Resp 14   Ht 5\' 8"  (1.727 m)   Wt 115 kg   SpO2 100%   BMI 38.55 kg/m   Physical Exam Vitals and nursing note reviewed.  Constitutional:      Appearance: He is well-developed.  HENT:     Head: Normocephalic and atraumatic.  Eyes:     Conjunctiva/sclera: Conjunctivae normal.  Cardiovascular:     Rate and Rhythm: Normal rate and regular rhythm.     Heart sounds: No murmur heard.   Pulmonary:     Effort: Pulmonary effort is normal. No respiratory distress.     Breath sounds: Normal breath sounds.  Abdominal:     Palpations: Abdomen is soft.     Tenderness: There is no abdominal tenderness.  Musculoskeletal:     Cervical back: Neck supple.  Skin:    General: Skin is warm and dry.     Capillary Refill: Capillary  refill takes less than 2 seconds.  Neurological:     General: No focal deficit present.     Mental Status: He is alert.     ED Results / Procedures / Treatments   Labs (all labs ordered are listed, but only abnormal results are displayed) Labs Reviewed  CBC - Abnormal; Notable for the following components:      Result Value   WBC 3.5 (*)    Hemoglobin 11.5 (*)    HCT 37.4 (*)    MCH 24.9 (*)    All other components within normal limits  COMPREHENSIVE METABOLIC PANEL - Abnormal; Notable for the following components:   Glucose, Bld 170 (*)    Creatinine, Ser 1.52 (*)    Calcium 8.4 (*)    Total Protein 5.7 (*)    Albumin 1.8 (*)    GFR, Estimated 53 (*)    Anion gap 4 (*)    All other components within normal limits  TROPONIN I (HIGH SENSITIVITY) - Abnormal; Notable for the following components:   Troponin I (High Sensitivity) 19 (*)    All other components within normal limits  TROPONIN I (HIGH SENSITIVITY) - Abnormal; Notable for the following components:   Troponin I (High Sensitivity) 21 (*)    All other components within normal limits  BRAIN NATRIURETIC PEPTIDE  LIPASE, BLOOD  RAPID URINE DRUG SCREEN, HOSP PERFORMED    EKG EKG Interpretation  Date/Time:  Friday Jun 30 2020 00:34:04 EDT Ventricular Rate:  91 PR Interval:  172 QRS Duration: 82 QT Interval:  370 QTC Calculation: 455 R Axis:   16 Text Interpretation: Normal sinus rhythm Anterior infarct , age undetermined T wave abnormality, consider lateral ischemia Abnormal ECG Confirmed by Madalyn Rob [phone]) on 06/30/2020 7:05:53 AM   Radiology DG Chest 2 View  Result Date: 06/30/2020 CLINICAL DATA:  Chest pain. EXAM: CHEST - 2 VIEW COMPARISON:  Jun 25, 2020 FINDINGS: The cardiac silhouette is mildly enlarged. Both lungs are clear. The visualized skeletal structures are unremarkable. IMPRESSION: Stable cardiomegaly without active cardiopulmonary disease. Electronically Signed   By: Virgina Norfolk M.D.    On: 06/30/2020 01:27    Procedures Procedures   Medications Ordered in ED Medications  amLODipine (NORVASC) tablet 10 mg (10 mg Oral Given 06/30/20 0758)  sacubitril-valsartan (ENTRESTO) 49-51 mg per tablet (1 tablet Oral Given 06/30/20 0759)  fentaNYL (SUBLIMAZE) injection 50 mcg (50 mcg Intravenous Given 06/30/20 0859)    ED Course  I have reviewed the triage vital signs and the nursing notes.  Pertinent labs & imaging results that were available during my care of the patient were reviewed by me and considered in my medical decision making (see chart for details).    MDM Rules/Calculators/A&P                         60 year old male presenting to ER with concern for chest pain.  Recent admission for similar symptoms.  Work-up at that time including CTA chest, nuclear medicine study, echocardiogram were grossly stable. Today, troponin is flat. ekg unchanged. I discussed the case again today with Dr. Terri Skains who is on-call for his cardiologist given his cardiac history.  He recommends observation admission, likely will proceed to cardiac catheterization on Monday.  Request medicine to admit.  Will consult trh for admit.  Final Clinical Impression(s) / ED Diagnoses Final diagnoses:  Chest pain, unspecified type    Rx / DC Orders ED Discharge Orders    None       Lucrezia Starch, MD 06/30/20 [phone]

## 2020-06-30 NOTE — ED Triage Notes (Signed)
Brought in from [facility] and sob started about a week ago and pain getting  progressively worse. Lung sounds clear. Swelling to both lower extremities.   324mg  aspirin. 0.4 nitro SL given.   175/120BP initially. 20 Left ac.

## 2020-06-30 NOTE — ED Notes (Signed)
Nurse cannot take report for this patient at this time

## 2020-06-30 NOTE — Consult Note (Signed)
CARDIOLOGY CONSULT NOTE  Patient ID: [name] MRN: [id number] DOB/AGE: [birth year] 59 y.o.  Admit date: 06/30/2020 Attending physician: Geradine Girt, DO Primary Physician:  [name], NP Outpatient Cardiologist: NA Inpatient Cardiologist: Rex Kras, DO, [facility]  Chief complaint: Chest pain and shortness of breath  Reason of consultation: Chest pain Referring physician: Dr. Madalyn Rob  HPI:  [name] is a 60 y.o. African-American male who presents with a chief complaint of " chest pain and shortness of breath." His past medical history and cardiovascular risk factors include: known CAD with subtotal occlusion of OM1 (cath 06/2019 at [facility]),  Hypertension, family history of premature CAD, hyperlipidemia, HFpEF, diabetes mellitus type 2, history of CVA with right-sided residual deficits, history of polysubstance abuse, obesity due to excess calories.  Patient was recently discharged from the hospital after being evaluated for chest pain that was transferred to assisted living facility.  Patient states that after getting there he was fine thereafter developed chest pain around dinnertime.  Patient states that the pain was worse after eating dinner, he describes the pain as a gripping like sensation, 9 out of 10 in intensity, located over the right anterior chest wall and epigastric region, constant.  Unable to comment on effort related chest pain as he uses an electric wheelchair after his stroke.  The discomfort is usually self-limiting, not pleuritic, not positional.  Associated symptoms include nausea, vomiting, lightheadedness, dizziness, and lower extremity swelling.  Patient has history of cocaine use; however, denies any recent consumption.  Patient was recently hospitalized at [facility] and discharged on 06/28/2020 after undergoing an echocardiogram and nuclear stress test.  The echocardiogram noted preserved LVEF and nuclear stress test noted to be overall a  low risk study.  He now presents for reevaluation.  ALLERGIES: Allergies  Allergen Reactions  . Other Anaphylaxis, Itching and Swelling    Allergy to GRITS , unknown exactly the specific ingredient he is allergic too    PAST MEDICAL HISTORY: Past Medical History:  Diagnosis Date  . Arthritis   . Bipolar affective ([place])   . CHF (congestive heart failure) ([place])   . Depression   . Diabetes mellitus    uncontrolled   . Hypertension     PAST SURGICAL HISTORY: Past Surgical History:  Procedure Laterality Date  . BOWEL DECOMPRESSION N/A 01/19/2020   Procedure: BOWEL DECOMPRESSION;  Surgeon: Otis Brace, MD;  Location: [place];  Service: Gastroenterology;  Laterality: N/A;  . COLONOSCOPY WITH PROPOFOL N/A 01/19/2020   Procedure: COLONOSCOPY WITH PROPOFOL;  Surgeon: Otis Brace, MD;  Location: [place];  Service: Gastroenterology;  Laterality: N/A;  . KNEE SURGERY     b/l knees; pending total knee     FAMILY HISTORY: The patient family history includes Cancer in his mother; Diabetes in an other family member; Heart attack (age of onset: 44) in his mother; Heart attack (age of onset: 66) in his father; Hypertension in an other family member; Stroke in his sister.   SOCIAL HISTORY:  The patient  reports that he has quit smoking. He has never used smokeless tobacco. He reports current drug use. Drug: Cocaine. He reports that he does not drink alcohol.  MEDICATIONS: Current Outpatient Medications  Medication Instructions  . amLODipine (NORVASC) 10 mg, Oral, Daily  . amLODipine (NORVASC) 10 mg, Oral, Daily  . ammonium lactate (LAC-HYDRIN) 12 % lotion 1 application, Topical, 2 times daily, Both legs  . atorvastatin (LIPITOR) 20 mg, Oral, Daily  . Basaglar KwikPen  20 Units, Subcutaneous, Daily at bedtime  . feeding supplement (ENSURE ENLIVE / ENSURE PLUS) LIQD 237 mLs, Oral, 2 times daily between meals  . furosemide (LASIX) 40 mg, Oral, Daily  . insulin aspart  (NOVOLOG) 12 Units, Subcutaneous, 3 times daily before meals, For blood sugars 0-150 give 0 units of insulin, 151-200 give 2 units of insulin, 201-250 give 4 units, 251-300 give 6 units, 301-350 give 8 units, 351-400 give 10 units,> 400 give 12 units and call M.D.  . Lantus SoloStar 20 Units, Subcutaneous, Daily at bedtime  . polyethylene glycol (MIRALAX / GLYCOLAX) 17 g, Oral, Daily PRN  . pregabalin (LYRICA) 75 mg, Oral, 2 times daily  . sacubitril-valsartan (ENTRESTO) 49-51 MG 1 tablet, Oral, 2 times daily  . sertraline (ZOLOFT) 50 mg, Oral, Daily  . tamsulosin (FLOMAX) 0.4 mg, Oral, Daily    14 ORGAN REVIEW OF SYSTEMS: Review of Systems  Constitutional: Negative for chills and fever.  HENT: Negative for hoarse voice and nosebleeds.   Eyes: Negative for discharge, double vision and pain.  Cardiovascular: Positive for chest pain and leg swelling. Negative for claudication, dyspnea on exertion, near-syncope, orthopnea, palpitations, paroxysmal nocturnal dyspnea and syncope.  Respiratory: Positive for shortness of breath. Negative for hemoptysis.   Musculoskeletal: Negative for muscle cramps and myalgias.  Gastrointestinal: Positive for abdominal pain, heartburn, nausea and vomiting. Negative for constipation, diarrhea, hematemesis, hematochezia and melena.  Neurological: Positive for dizziness, focal weakness and light-headedness.  Psychiatric/Behavioral: Negative for altered mental status, substance abuse and suicidal ideas. The patient is nervous/anxious.   All other systems reviewed and are negative.   PHYSICAL EXAM: Vitals with BMI 06/30/2020 06/30/2020 06/30/2020  Height - - -  Weight - - -  BMI - - -  Systolic 361 443 -  Diastolic 154 95 -  Pulse 87 84 87  Some encounter information is confidential and restricted. Go to Review Flowsheets activity to see all data.    No intake or output data in the 24 hours ending 06/30/20 1725  Net IO Since Admission: No IO data has been entered  for this period [06/30/20 1725]  CONSTITUTIONAL: Hemodynamically stable, appears older than stated age, no acute distress.    SKIN: Skin is warm and dry. No rash noted. No cyanosis. No pallor. No jaundice HEAD: Normocephalic and atraumatic.  EYES: No scleral icterus MOUTH/THROAT: Dry oral membranes.  NECK: No JVD present. No thyromegaly noted. No carotid bruits  LYMPHATIC: No visible cervical adenopathy.  CHEST Normal respiratory effort. No intercostal retractions  LUNGS: Clear to auscultation bilaterally.  No stridor. No wheezes. No rales.  CARDIOVASCULAR: Regular, positive M0-Q6, soft holosystolic murmur heard at the apex, no gallops or rubs. ABDOMINAL: Obese, soft, tender over the epigastric region, nondistended, positive bowel sounds in all 4 quadrants no apparent ascites.  EXTREMITIES: Trace bilateral peripheral edema, poor skin hygiene HEMATOLOGIC: No significant bruising NEUROLOGIC: Oriented to person, place, and time.  Weakness in the right upper and lower extremity. PSYCHIATRIC: Normal mood and affect. Normal behavior. Cooperative  RADIOLOGY: DG Chest 2 View  Result Date: 06/30/2020 CLINICAL DATA:  Chest pain. EXAM: CHEST - 2 VIEW COMPARISON:  Jun 25, 2020 FINDINGS: The cardiac silhouette is mildly enlarged. Both lungs are clear. The visualized skeletal structures are unremarkable. IMPRESSION: Stable cardiomegaly without active cardiopulmonary disease. Electronically Signed   By: Virgina Norfolk M.D.   On: 06/30/2020 01:27    LABORATORY DATA: Lab Results  Component Value Date   WBC 3.5 (L) 06/30/2020   HGB 11.5 (  L) 06/30/2020   HCT 37.4 (L) 06/30/2020   MCV 81.0 06/30/2020   PLT 260 06/30/2020    Recent Labs  Lab 06/30/20 0039  NA 135  K 3.9  CL 106  CO2 25  BUN 17  CREATININE 1.52*  CALCIUM 8.4*  PROT 5.7*  BILITOT 0.5  ALKPHOS 76  ALT 10  AST 15  GLUCOSE 170*    Lipid Panel     Component Value Date/Time   CHOL 305 (H) 06/25/2020 0711   TRIG 90  06/25/2020 0711   HDL 63 06/25/2020 0711   CHOLHDL 4.8 06/25/2020 0711   VLDL 18 06/25/2020 0711   LDLCALC 224 (H) 06/25/2020 0711    BNP (last 3 results) Recent Labs    12/31/19 2122 06/25/20 0456 06/30/20 0039  BNP 116.8* 168.5* 77.6    HEMOGLOBIN A1C Lab Results  Component Value Date   HGBA1C 9.1 (H) 06/25/2020   MPG 214.47 06/25/2020    Cardiac Panel (last 3 results) Recent Labs    12/31/19 2122 01/02/20 0750  CKTOTAL 970* 568*    Lab Results  Component Value Date   CKTOTAL 568 (H) 01/02/2020   CKMB 2.2 01/30/2010     TSH Recent Labs    01/01/20 0849 01/18/20 0517 06/25/20 1244  TSH 1.354 2.317 1.714      CARDIAC DATABASE: EKG: 06/30/2020: Normal sinus rhythm, 91 bpm, normal axis, T wave inversions in the high lateral lateral leads suggestive of possible ischemia, without underlying injury pattern.  Compared to prior EKG T wave inversions in the lateral leads are new.  Echocardiogram: 06/25/2020: 1. Left ventricular ejection fraction, by estimation, is 60 to 65%. The  left ventricle has normal function. The left ventricle has no regional  wall motion abnormalities. There is severe left ventricular hypertrophy.  Left ventricular diastolic parameters  are consistent with Grade I diastolic dysfunction (impaired relaxation).  2. Right ventricular systolic function is normal. The right ventricular  size is normal. There is moderately elevated pulmonary artery systolic  pressure. The estimated right ventricular systolic pressure is 17.6 mmHg.  3. Left atrial size was moderately dilated.  4. A small pericardial effusion is present. The pericardial effusion is  circumferential. There is no evidence of cardiac tamponade.  5. The mitral valve is normal in structure. Mild to moderate mitral valve  regurgitation. No evidence of mitral stenosis.  6. The aortic valve is calcified. There is moderate thickening of the  aortic valve. Aortic valve regurgitation  is mild. Mild aortic valve  stenosis. Aortic valve area, by VTI measures 1.26 cm. Aortic valve mean  gradient measures 14.0 mmHg. Aortic valve  Vmax measures 2.48 m/s.  7. The inferior vena cava is dilated in size with <50% respiratory  variability, suggesting right atrial pressure of 15 mmHg.  8. There is a small patent foramen ovale.   Stress Testing:  Lexiscan 06/27/2020: 1. No reversible ischemia. Probable inferior wall diaphragmatic attenuation.  2. Normal left ventricular wall motion.  3. Left ventricular ejection fraction 50%  4. Non invasive risk stratification*: Low  Heart Catheterization: Left heart catheterization 06/30/2019 ([facility]):  Left main is patent.  LAD has mild diffuse irregularity. Diagonal and septal branches are  patent. The left circumflex is a dominant vessel and has mild diffuse  irregularity. Obtuse marginal 1 is a small caliber branch and has  subtotal occlusion. Second obtuse marginal and PLV branches are patent.  Right coronary artery is small and nondominant. It is patent.  Left milligrams performed  revealed preserved LV systolic function  left-ventricular ejection fraction 65%.   Plan: Medical therapy for very small caliber subtotal occlusion of OM1 branch.  Aspirin, statin and ACE inhibitor's. Beta-blockers not used due to  history of cocaine use.  Aggressive control of his blood pressure and diabetes.  Rehab for cocaine use.  IMPRESSION & RECOMMENDATIONS: [name] is a 60 y.o. African-American male whose past medical history and cardiovascular risk factors include: known CAD with subtotal occlusion of OM1 (cath 06/2019 at [facility]),  Hypertension, family history of premature CAD, hyperlipidemia, HFpEF, diabetes mellitus type 2, history of CVA with right-sided residual deficits, history of polysubstance abuse, obesity due to excess calories.  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  EKG shows normal sinus rhythm with T wave inversions in the high lateral and lateral leads and high sensitive troponins relatively flat.  Very low suspicion for ACS.  Given the T wave inversions in the lateral leads suspect supply demand ischemia in the setting known subtotal occlusion of OM1 and hypertensive emergency on admission with a systolic blood pressure as high as 191/117.  He has also undergone extensive cardiovascular work-up during his recent hospitalization which included an echocardiogram and nuclear stress test.  Patient was noted to have no reversible perfusion defect and overall a low risk myocardial perfusion study.  However, he has multiple cardiovascular risk factors including uncontrolled diabetes, hx of CVA, hyperlipidemia (most recent LDL 224 mg/dL) so balanced ischemia remains in the differential diagnosis.    On physical examination patient is noted to have tenderness over the epigastric region and given his presentation of nausea and vomiting cannot rule out underlying GI pathology.  He would benefit from a GI consultation during this hospitalization to see if additional recommendations are warranted.  In the meantime would focus on improving his blood pressure control and addressing his modifiable cardiovascular risk factors.  Had a long discussion with the patient with regards to further cardiovascular testing.  We discussed that nuclear stress test cannot rule out balanced ischemia and therefore we can consider left heart catheterization prior to  discharge.  I also reviewed the most recent heart catheterization results from [place] from 2021 with the patient.  After discussing the risks, benefits, and alternatives patient appears to be hesitant with proceeding with left heart catheterization would like some time prior to consenting for angiography.  Patient has history of chronic HFpEF and given the clinical presentation low suspicion for acute heart failure exacerbation.  Patient does not have JVP on examination, no orthopnea/paroxysmal nocturnal dyspnea, and very minimal lower extremity swelling.  Patient's BNP is also lower than his prior admissions.  We will continue his GDMT.  Will start aspirin 81 mg p.o. daily -given his symptoms and known aortic atherosclerosis /history of CVA.  Increase Lipitor to 40 mg p.o. nightly, given his recent fasting lipid profile.  Start carvedilol 6.25 mg p.o. twice daily.  Monitor blood pressures.  Kidney function remained stable consider initiation of Farxiga tomorrow morning.  Continue your care regarding his other comorbid conditions.   Total encounter time 85 minutes. *Total Encounter Time as defined by the Centers for Medicare and Medicaid Services includes,  in addition to the face-to-face time of a patient visit (documented in the note above) non-face-to-face time: obtaining and reviewing outside history, ordering and reviewing medications, tests or procedures, care coordination (communications with other health care professionals or caregivers) and documentation in the medical record.  Patient's questions and concerns were addressed to his satisfaction. He voices understanding of the instructions provided during this encounter.   This note was created using a voice recognition software as a result there may be grammatical errors inadvertently enclosed that do not reflect the nature of this encounter. Every attempt is made to correct such errors.  Mechele Claude [facility]  Pager:  [phone] Office: [phone] 06/30/2020, 5:25 PM

## 2020-06-30 NOTE — ED Provider Notes (Signed)
MSE was initiated and I personally evaluated the patient and placed orders (if any) at  12:36 AM on Jun 30, 2020.  Patient here with chest pain and shortness of breath x1 week.  Reports having vomiting that started about 2 days ago.  Denies fever, chills, cough.  Was given 324 mg aspirin, 0.4 nitro.  Alert and oriented Anterior chest wall tenderness Normal heart rate Generalized abdominal tenderness Swelling to bilateral lower extremities  Discussed with patient that their care has been initiated.   They are counseled that they will need to remain in the ED until the completion of their workup, including full H&P and results of any tests.  Risks of leaving the emergency department prior to completion of treatment were discussed. Patient was advised to inform ED staff if they are leaving before their treatment is complete. The patient acknowledged these risks and time was allowed for questions.    The patient appears stable so that the remainder of the MSE may be completed by another provider.    Montine Circle, PA-C 06/30/20 0037    Merrily Pew, MD 06/30/20 [phone]

## 2020-06-30 NOTE — ED Notes (Signed)
Pt given sandwich bag and diet sprite °

## 2020-07-01 DIAGNOSIS — R079 Chest pain, unspecified: Secondary | ICD-10-CM

## 2020-07-01 DIAGNOSIS — D72819 Decreased white blood cell count, unspecified: Secondary | ICD-10-CM

## 2020-07-01 DIAGNOSIS — E1165 Type 2 diabetes mellitus with hyperglycemia: Secondary | ICD-10-CM | POA: Diagnosis not present

## 2020-07-01 DIAGNOSIS — E7800 Pure hypercholesterolemia, unspecified: Secondary | ICD-10-CM | POA: Diagnosis not present

## 2020-07-01 DIAGNOSIS — R1013 Epigastric pain: Secondary | ICD-10-CM | POA: Diagnosis not present

## 2020-07-01 DIAGNOSIS — E1151 Type 2 diabetes mellitus with diabetic peripheral angiopathy without gangrene: Secondary | ICD-10-CM | POA: Diagnosis not present

## 2020-07-01 DIAGNOSIS — D709 Neutropenia, unspecified: Secondary | ICD-10-CM | POA: Diagnosis not present

## 2020-07-01 DIAGNOSIS — I700 Atherosclerosis of aorta: Secondary | ICD-10-CM | POA: Diagnosis not present

## 2020-07-01 DIAGNOSIS — N179 Acute kidney failure, unspecified: Secondary | ICD-10-CM | POA: Diagnosis not present

## 2020-07-01 DIAGNOSIS — R69 Illness, unspecified: Secondary | ICD-10-CM | POA: Diagnosis not present

## 2020-07-01 DIAGNOSIS — R072 Precordial pain: Secondary | ICD-10-CM | POA: Diagnosis not present

## 2020-07-01 DIAGNOSIS — I4891 Unspecified atrial fibrillation: Secondary | ICD-10-CM | POA: Diagnosis not present

## 2020-07-01 DIAGNOSIS — R9431 Abnormal electrocardiogram [ECG] [EKG]: Secondary | ICD-10-CM | POA: Diagnosis not present

## 2020-07-01 DIAGNOSIS — I160 Hypertensive urgency: Secondary | ICD-10-CM | POA: Diagnosis not present

## 2020-07-01 DIAGNOSIS — I161 Hypertensive emergency: Secondary | ICD-10-CM | POA: Diagnosis not present

## 2020-07-01 DIAGNOSIS — I5032 Chronic diastolic (congestive) heart failure: Secondary | ICD-10-CM | POA: Diagnosis not present

## 2020-07-01 DIAGNOSIS — H538 Other visual disturbances: Secondary | ICD-10-CM | POA: Diagnosis not present

## 2020-07-01 DIAGNOSIS — I313 Pericardial effusion (noninflammatory): Secondary | ICD-10-CM | POA: Diagnosis not present

## 2020-07-01 DIAGNOSIS — N189 Chronic kidney disease, unspecified: Secondary | ICD-10-CM | POA: Diagnosis not present

## 2020-07-01 LAB — CBC
HCT: 35.5 % — ABNORMAL LOW (ref 39.0–52.0)
Hemoglobin: 11.1 g/dL — ABNORMAL LOW (ref 13.0–17.0)
MCH: 25.1 pg — ABNORMAL LOW (ref 26.0–34.0)
MCHC: 31.3 g/dL (ref 30.0–36.0)
MCV: 80.3 fL (ref 80.0–100.0)
Platelets: 227 10*3/uL (ref 150–400)
RBC: 4.42 MIL/uL (ref 4.22–5.81)
RDW: 14.1 % (ref 11.5–15.5)
WBC: 3.3 10*3/uL — ABNORMAL LOW (ref 4.0–10.5)
nRBC: 0 % (ref 0.0–0.2)

## 2020-07-01 LAB — BASIC METABOLIC PANEL
Anion gap: 7 (ref 5–15)
BUN: 17 mg/dL (ref 6–20)
CO2: 24 mmol/L (ref 22–32)
Calcium: 8.2 mg/dL — ABNORMAL LOW (ref 8.9–10.3)
Chloride: 102 mmol/L (ref 98–111)
Creatinine, Ser: 1.69 mg/dL — ABNORMAL HIGH (ref 0.61–1.24)
GFR, Estimated: 46 mL/min — ABNORMAL LOW (ref >60)
Glucose, Bld: 202 mg/dL — ABNORMAL HIGH (ref 70–99)
Potassium: 4 mmol/L (ref 3.5–5.1)
Sodium: 133 mmol/L — ABNORMAL LOW (ref 135–145)

## 2020-07-01 LAB — GLUCOSE, CAPILLARY
Glucose-Capillary: 289 mg/dL — ABNORMAL HIGH (ref 70–99)
Glucose-Capillary: 246 mg/dL — ABNORMAL HIGH (ref 70–99)

## 2020-07-01 MED ORDER — SODIUM CHLORIDE 0.9 % IV SOLN: INTRAVENOUS | Status: DC

## 2020-07-01 MED ORDER — INSULIN ASPART 100 UNIT/ML IJ SOLN
0.0000 [IU] | Freq: Three times a day (TID) | INTRAMUSCULAR | Status: DC
  Administered 2020-07-01: 5 [IU] via SUBCUTANEOUS
  Administered 2020-07-02: 3 [IU] via SUBCUTANEOUS
  Administered 2020-07-02: 2 [IU] via SUBCUTANEOUS
  Administered 2020-07-02: 3 [IU] via SUBCUTANEOUS
  Administered 2020-07-03: 5 [IU] via SUBCUTANEOUS
  Administered 2020-07-04: 3 [IU] via SUBCUTANEOUS
  Administered 2020-07-04: 7 [IU] via SUBCUTANEOUS
  Administered 2020-07-05 (×3): 2 [IU] via SUBCUTANEOUS

## 2020-07-01 MED ORDER — INSULIN ASPART 100 UNIT/ML IJ SOLN
0.0000 [IU] | Freq: Every day | INTRAMUSCULAR | Status: DC
  Administered 2020-07-01 – 2020-07-03 (×2): 2 [IU] via SUBCUTANEOUS
  Administered 2020-07-04: 4 [IU] via SUBCUTANEOUS
  Administered 2020-07-04: 2 [IU] via SUBCUTANEOUS

## 2020-07-01 MED ORDER — HYDRALAZINE HCL 25 MG PO TABS
25.0000 mg | ORAL_TABLET | Freq: Three times a day (TID) | ORAL | Status: DC | PRN
  Administered 2020-07-01: 25 mg via ORAL
  Filled 2020-07-01 (×2): qty 1

## 2020-07-01 MED ORDER — INSULIN GLARGINE 100 UNIT/ML ~~LOC~~ SOLN
20.0000 [IU] | Freq: Every day | SUBCUTANEOUS | Status: DC
  Administered 2020-07-01: 20 [IU] via SUBCUTANEOUS
  Filled 2020-07-01 (×4): qty 0.2

## 2020-07-01 NOTE — Care Management Obs Status (Signed)
[facility] NOTIFICATION   Patient Details  Name: [name] MRN: [id number] Date of Birth: [birth year]   Medicare Observation Status Notification Given:  Yes    Carles Collet, RN 07/01/2020, 5:00 PM

## 2020-07-01 NOTE — Progress Notes (Signed)
PROGRESS NOTE    [name]  HUD:[id number] DOB: May 31, 1960 DOA: 06/30/2020 PCP: [name], NP    Chief Complaint  Patient presents with  . Chest Pain  . Shortness of Breath    Brief Narrative:  60 year old male with history of hypertension, CHF, diabetes mellitus type 2, CVA with residual right-sided weakness, depression, remote polysubstance abuse presents with complaints of intermittent chest pressure. Pt was recently admitted for the same reasons between 06/25/20 and 06/28/20, underwent non invasive test, was negative for acute ischemia and discharged.  But presents for recurrent symptoms. Cardiology consulted and he is scheduled for cardiac cath on Monday.   Assessment & Plan:   Active Problems:   Leukopenia   Diabetes mellitus type 2, uncontrolled (HCC)   Hypertensive urgency   Chest pain   CKD (chronic kidney disease)     Chest pain:  patient has mild elevation of troponin, EKG showed nonspecific T wave abnormality in lateral leads.  CXR does not show any acute pulm disease.  Echocardiogram shows grade 1 diastolic dysfunction. Moderately elevated pulmonary artery systolic pressure. Cardiology was consulted,  Underwent nuclear stress test was negative for ischemia.  Pt presents 2 days later with similar symptoms, and cardiology recommends cath on Monday. If cardiac etiology is ruled out, will call GI to see if he warrants to have an endoscopy .   Continue with aspirin , lipitor, coreg and entresto.     Hypertensive Urgency:  Better controlled after initiation of anti hypertensive's.   Pulmonary artery hypertension-  echocardiogram shows moderately reduced pulmonary artery pressure. Patient currently not requiring oxygen. Recommend outpatient follow up with pulmonology for surveillance.     Diabetes mellitus type 2, insulin dependent with stage 2 CKD. Uncontrolled with hyperglycemia.  -hemoglobin A1c is 9.1.  Start the patient on lantus and SSI.     Leukopenia- improving. Unclear etiology.   Iron deficiency anemia- patient's hemoglobin is 11, serum iron studies from November 2021 showed serum iron of 33 with saturation 13%. Patient is followed by [facility]. He had 2 attempts of colonoscopy November 2021 and December 2021 but patient had inadequate prep. Follow-up as outpatient.    Hypoalbuminemia-unclear etiology,  Will need dietary consult and outpatient follow up.   CKD stage II- Creatinine around 1.6 today improved from 1.8, on discharge.    History of CVA with residual deficit- patient has known history of CVA in September 2021 which left him with residual right-sided weakness and mostly wheelchair-bound.   Thyroid nodule- CTA chest shows bilateral thyroid nodules measuring 2.6 cm on the right. TSH 1.714. Will need thyroid ultrasound as outpatient. Follow-up PCP for further work-up.  Mild hyponatremia:  Continue to monitor.     DVT prophylaxis:  Code Status: full code.  Family Communication: None at bedside.  Disposition:   Status is: Observation  The patient will require care spanning > 2 midnights and should be moved to inpatient because: Ongoing diagnostic testing needed not appropriate for outpatient work up  Dispo: The patient is from: Home              Anticipated d/c is to: ALF              Patient currently is not medically stable to d/c.   Difficult to place patient No       Consultants:   Cardiology.    Procedures: cardiac cath scheduled on Monday.    Antimicrobials: none.    Subjective: Pt reports chest pressure improved, no  sob. No nausea, vomiting.   Objective: Vitals:   07/01/20 0423 07/01/20 0748 07/01/20 0918 07/01/20 1138  BP: (!) 152/94 (!) 172/105  114/70  Pulse: 84 80 87 81  Resp: 20 14  16   Temp: 98.2 F (36.8 C) 98.2 F (36.8 C)  98.6 F (37 C)  TempSrc: Oral Oral  Axillary  SpO2: 96%     Weight:      Height:        Intake/Output Summary (Last 24 hours)  at 07/01/2020 1411 Last data filed at 07/01/2020 1008 Gross per 24 hour  Intake 330 ml  Output 1425 ml  Net -1095 ml   Filed Weights   06/30/20 0031  Weight: 115 kg    Examination:  General exam: Appears calm and comfortable  Respiratory system: Clear to auscultation. Respiratory effort normal. Cardiovascular system: S1 & S2 heard, RRR. No JVD,  Leg edema present.  Gastrointestinal system: Abdomen is nondistended, soft and nontender. No organomegaly or masses felt. Normal bowel sounds heard. Central nervous system: Alert and oriented. No focal neurological deficits. Extremities: Symmetric 5 x 5 power. Skin: No rashes, lesions or ulcers Psychiatry:  Mood & affect appropriate.     Data Reviewed: I have personally reviewed following labs and imaging studies  CBC: Recent Labs  Lab 06/25/20 0538 06/27/20 0301 06/30/20 0040 07/01/20 0245  WBC 2.9* 2.9* 3.5* 3.3*  NEUTROABS 1.4*  --   --   --   HGB 11.0* 10.1* 11.5* 11.1*  HCT 35.0* 32.0* 37.4* 35.5*  MCV 78.5* 78.4* 81.0 80.3  PLT 218 211 260 875    Basic Metabolic Panel: Recent Labs  Lab 06/25/20 0456 06/28/20 0309 06/30/20 0039 07/01/20 0245  NA 133* 133* 135 133*  K 4.0 4.0 3.9 4.0  CL 103 103 106 102  CO2 22 27 25 24   GLUCOSE 215* 214* 170* 202*  BUN 11 19 17 17   CREATININE 1.36* 1.88* 1.52* 1.69*  CALCIUM 8.3* 7.9* 8.4* 8.2*    GFR: Estimated Creatinine Clearance: 58.6 mL/min (A) (by C-G formula based on SCr of 1.69 mg/dL (H)).  Liver Function Tests: Recent Labs  Lab 06/25/20 0456 06/27/20 0301 06/30/20 0039  AST 20 13* 15  ALT 13 11 10   ALKPHOS 102 81 76  BILITOT 0.6 <0.1* 0.5  PROT 6.0* 5.3* 5.7*  ALBUMIN 1.9* 1.6* 1.8*    CBG: Recent Labs  Lab 06/27/20 1622 06/27/20 1833 06/27/20 2141 06/28/20 0740 06/28/20 1148  GLUCAP 205* 192* 173* 218* 150*     Recent Results (from the past 240 hour(s))  Resp Panel by RT-PCR (Flu A&B, Covid) Nasopharyngeal Swab     Status: None   Collection  Time: 06/25/20  8:32 AM   Specimen: Nasopharyngeal Swab; Nasopharyngeal(NP) swabs in vial transport medium  Result Value Ref Range Status   SARS Coronavirus 2 by RT PCR NEGATIVE NEGATIVE Final    Comment: (NOTE) SARS-CoV-2 target nucleic acids are NOT DETECTED.  The SARS-CoV-2 RNA is generally detectable in upper respiratory specimens during the acute phase of infection. The lowest concentration of SARS-CoV-2 viral copies this assay can detect is 138 copies/mL. A negative result does not preclude SARS-Cov-2 infection and should not be used as the sole basis for treatment or other patient management decisions. A negative result may occur with  improper specimen collection/handling, submission of specimen other than nasopharyngeal swab, presence of viral mutation(s) within the areas targeted by this assay, and inadequate number of viral copies(<138 copies/mL). A negative result must be combined  with clinical observations, patient history, and epidemiological information. The expected result is Negative.  Fact Sheet for Patients:  EntrepreneurPulse.com.au  Fact Sheet for Healthcare Providers:  IncredibleEmployment.be  This test is no t yet approved or cleared by the [place] FDA and  has been authorized for detection and/or diagnosis of SARS-CoV-2 by FDA under an Emergency Use Authorization (EUA). This EUA will remain  in effect (meaning this test can be used) for the duration of the COVID-19 declaration under Section 564(b)(1) of the Act, 21 U.S.C.section 360bbb-3(b)(1), unless the authorization is terminated  or revoked sooner.       Influenza A by PCR NEGATIVE NEGATIVE Final   Influenza B by PCR NEGATIVE NEGATIVE Final    Comment: (NOTE) The Xpert Xpress SARS-CoV-2/FLU/RSV plus assay is intended as an aid in the diagnosis of influenza from Nasopharyngeal swab specimens and should not be used as a sole basis for treatment. Nasal washings  and aspirates are unacceptable for Xpert Xpress SARS-CoV-2/FLU/RSV testing.  Fact Sheet for Patients: EntrepreneurPulse.com.au  Fact Sheet for Healthcare Providers: IncredibleEmployment.be  This test is not yet approved or cleared by the [place] FDA and has been authorized for detection and/or diagnosis of SARS-CoV-2 by FDA under an Emergency Use Authorization (EUA). This EUA will remain in effect (meaning this test can be used) for the duration of the COVID-19 declaration under Section 564(b)(1) of the Act, 21 U.S.C. section 360bbb-3(b)(1), unless the authorization is terminated or revoked.  Performed at [facility], [place] [address] [place], [place] [address]   SARS CORONAVIRUS 2 (TAT 6-24 HRS) Nasopharyngeal Nasopharyngeal Swab     Status: None   Collection Time: 06/30/20 12:47 PM   Specimen: Nasopharyngeal Swab  Result Value Ref Range Status   SARS Coronavirus 2 NEGATIVE NEGATIVE Final    Comment: (NOTE) SARS-CoV-2 target nucleic acids are NOT DETECTED.  The SARS-CoV-2 RNA is generally detectable in upper and lower respiratory specimens during the acute phase of infection. Negative results do not preclude SARS-CoV-2 infection, do not rule out co-infections with other pathogens, and should not be used as the sole basis for treatment or other patient management decisions. Negative results must be combined with clinical observations, patient history, and epidemiological information. The expected result is Negative.  Fact Sheet for Patients: SugarRoll.be  Fact Sheet for Healthcare Providers: https://www.woods-mathews.com/  This test is not yet approved or cleared by the [place] FDA and  has been authorized for detection and/or diagnosis of SARS-CoV-2 by FDA under an Emergency Use Authorization (EUA). This EUA will remain  in effect (meaning this test can be used) for the  duration of the COVID-19 declaration under Se ction 564(b)(1) of the Act, 21 U.S.C. section 360bbb-3(b)(1), unless the authorization is terminated or revoked sooner.  Performed at [facility], [place] [address], [place], [place] [address]          Radiology Studies: DG Chest 2 View  Result Date: 06/30/2020 CLINICAL DATA:  Chest pain. EXAM: CHEST - 2 VIEW COMPARISON:  Jun 25, 2020 FINDINGS: The cardiac silhouette is mildly enlarged. Both lungs are clear. The visualized skeletal structures are unremarkable. IMPRESSION: Stable cardiomegaly without active cardiopulmonary disease. Electronically Signed   By: Virgina Norfolk M.D.   On: 06/30/2020 01:27        Scheduled Meds: . amLODipine  10 mg Oral Daily  . aspirin  81 mg Oral Daily  . atorvastatin  40 mg Oral QHS  . carvedilol  6.25 mg Oral BID  WC  . heparin  5,000 Units Subcutaneous Q8H  . pantoprazole  40 mg Oral Daily  . polyethylene glycol  17 g Oral BID  . pregabalin  75 mg Oral BID  . sacubitril-valsartan  1 tablet Oral BID  . senna-docusate  1 tablet Oral BID  . sertraline  50 mg Oral Daily  . tamsulosin  0.8 mg Oral Daily   Continuous Infusions:   LOS: 0 days        Hosie Poisson, MD [facility]   To contact the attending provider between 7A-7P or the covering provider during after hours 7P-7A, please log into the web site [email or web address] and access using universal [place] password for that web site. If you do not have the password, please call the hospital operator.  07/01/2020, 2:11 PM

## 2020-07-01 NOTE — Progress Notes (Signed)
Progress Note  Patient Name: [name] Date of Encounter: 07/01/2020  Attending physician: Hosie Poisson, MD Primary care provider: [name], NP  Subjective: [name] is a 60 y.o. male who was seen and examined at bedside at 315pm No events overnight. No chest pain or shortness of breath  Mild epigastric discomfort.  Case discussed and reviewed with his nurse.  Objective: Vital Signs in the last 24 hours: Temp:  [97.7 F (36.5 C)-98.6 F (37 C)] 97.7 F (36.5 C) (05/07 1954) Pulse Rate:  [78-87] 82 (05/07 1954) Resp:  [13-20] 14 (05/07 1954) BP: (114-172)/(70-105) 116/81 (05/07 1954) SpO2:  [96 %-100 %] 100 % (05/07 1954)  Intake/Output:  Intake/Output Summary (Last 24 hours) at 07/01/2020 2037 Last data filed at 07/01/2020 1903 Gross per 24 hour  Intake 462.28 ml  Output 1350 ml  Net -887.72 ml    Net IO Since Admission: -1,612.72 mL [07/01/20 2037]  Weights:  Filed Weights   06/30/20 0031  Weight: 115 kg    Telemetry: Personally reviewed - NSR  Physical examination: PHYSICAL EXAM: Vitals with BMI 07/01/2020 07/01/2020 07/01/2020  Height - - -  Weight - - -  BMI - - -  Systolic 119 417 408  Diastolic 81 86 70  Pulse 82 78 81  Some encounter information is confidential and restricted. Go to Review Flowsheets activity to see all data.    CONSTITUTIONAL: Hemodynamically stable, appears older than stated age, no acute distress.    SKIN: Skin is warm and dry. No rash noted. No cyanosis. No pallor. No jaundice HEAD: Normocephalic and atraumatic.  EYES: No scleral icterus MOUTH/THROAT: Dry oral membranes.  NECK: No JVD present. No thyromegaly noted. No carotid bruits  LYMPHATIC: No visible cervical adenopathy.  CHEST Normal respiratory effort. No intercostal retractions  LUNGS: Clear to auscultation bilaterally.  No stridor. No wheezes. No rales.  CARDIOVASCULAR: Regular, positive X4-G8, soft holosystolic murmur heard at the apex, no gallops or  rubs. ABDOMINAL: Obese, soft, tender over the epigastric region, nondistended, positive bowel sounds in all 4 quadrants no apparent ascites.  EXTREMITIES: Trace bilateral peripheral edema, poor skin hygiene HEMATOLOGIC: No significant bruising NEUROLOGIC: Oriented to person, place, and time.  Weakness in the right upper and lower extremity. PSYCHIATRIC: Normal mood and affect. Normal behavior. Cooperative  Lab Results: Hematology Recent Labs  Lab 06/27/20 0301 06/30/20 0040 07/01/20 0245  WBC 2.9* 3.5* 3.3*  RBC 4.08* 4.62 4.42  HGB 10.1* 11.5* 11.1*  HCT 32.0* 37.4* 35.5*  MCV 78.4* 81.0 80.3  MCH 24.8* 24.9* 25.1*  MCHC 31.6 30.7 31.3  RDW 14.1 14.1 14.1  PLT 211 260 227    Chemistry Recent Labs  Lab 06/25/20 0456 06/27/20 0301 06/28/20 0309 06/30/20 0039 07/01/20 0245  NA 133*  --  133* 135 133*  K 4.0  --  4.0 3.9 4.0  CL 103  --  103 106 102  CO2 22  --  27 25 24   GLUCOSE 215*  --  214* 170* 202*  BUN 11  --  19 17 17   CREATININE 1.36*  --  1.88* 1.52* 1.69*  CALCIUM 8.3*  --  7.9* 8.4* 8.2*  PROT 6.0* 5.3*  --  5.7*  --   ALBUMIN 1.9* 1.6*  --  1.8*  --   AST 20 13*  --  15  --   ALT 13 11  --  10  --   ALKPHOS 102 81  --  76  --  BILITOT 0.6 <0.1*  --  0.5  --   GFRNONAA >60  --  41* 53* 46*  ANIONGAP 8  --  3* 4* 7     Cardiac Enzymes: Cardiac Panel (last 3 results) Recent Labs    06/30/20 0336 06/30/20 1247 06/30/20 1443  TROPONINIHS 21* 18* 17    BNP (last 3 results) Recent Labs    12/31/19 2122 06/25/20 0456 06/30/20 0039  BNP 116.8* 168.5* 77.6    ProBNP (last 3 results) No results for input(s): PROBNP in the last 8760 hours.   DDimer No results for input(s): DDIMER in the last 168 hours.   Hemoglobin A1c:  Lab Results  Component Value Date   HGBA1C 9.1 (H) 06/25/2020   MPG 214.47 06/25/2020    TSH  Recent Labs    01/01/20 0849 01/18/20 0517 06/25/20 1244  TSH 1.354 2.317 1.714    Lipid Panel     Component  Value Date/Time   CHOL 305 (H) 06/25/2020 0711   TRIG 90 06/25/2020 0711   HDL 63 06/25/2020 0711   CHOLHDL 4.8 06/25/2020 0711   VLDL 18 06/25/2020 0711   LDLCALC 224 (H) 06/25/2020 0711    Imaging: DG Chest 2 View  Result Date: 06/30/2020 CLINICAL DATA:  Chest pain. EXAM: CHEST - 2 VIEW COMPARISON:  Jun 25, 2020 FINDINGS: The cardiac silhouette is mildly enlarged. Both lungs are clear. The visualized skeletal structures are unremarkable. IMPRESSION: Stable cardiomegaly without active cardiopulmonary disease. Electronically Signed   By: Virgina Norfolk M.D.   On: 06/30/2020 01:27    Cardiac database: EKG: 06/30/2020: Normal sinus rhythm, 91 bpm, normal axis, T wave inversions in the high lateral lateral leads suggestive of possible ischemia, without underlying injury pattern.  Compared to prior EKG T wave inversions in the lateral leads are new.  Echocardiogram: 06/25/2020: 1. Left ventricular ejection fraction, by estimation, is 60 to 65%. The  left ventricle has normal function. The left ventricle has no regional  wall motion abnormalities. There is severe left ventricular hypertrophy.  Left ventricular diastolic parameters  are consistent with Grade I diastolic dysfunction (impaired relaxation).  2. Right ventricular systolic function is normal. The right ventricular  size is normal. There is moderately elevated pulmonary artery systolic  pressure. The estimated right ventricular systolic pressure is 08.1 mmHg.  3. Left atrial size was moderately dilated.  4. A small pericardial effusion is present. The pericardial effusion is  circumferential. There is no evidence of cardiac tamponade.  5. The mitral valve is normal in structure. Mild to moderate mitral valve  regurgitation. No evidence of mitral stenosis.  6. The aortic valve is calcified. There is moderate thickening of the  aortic valve. Aortic valve regurgitation is mild. Mild aortic valve  stenosis. Aortic valve area,  by VTI measures 1.26 cm. Aortic valve mean  gradient measures 14.0 mmHg. Aortic valve  Vmax measures 2.48 m/s.  7. The inferior vena cava is dilated in size with <50% respiratory  variability, suggesting right atrial pressure of 15 mmHg.  8. There is a small patent foramen ovale.   Stress Testing:  Lexiscan 06/27/2020: 1. No reversible ischemia. Probable inferior wall diaphragmatic attenuation.  2. Normal left ventricular wall motion.  3. Left ventricular ejection fraction 50%  4. Non invasive risk stratification*: Low  Heart Catheterization: Left heart catheterization 06/30/2019 ([facility]): Left main is patent.  LAD has mild diffuse irregularity. Diagonal and septal branches are  patent. The left circumflex is a dominant vessel and has  mild diffuse  irregularity. Obtuse marginal 1 is a small caliber branch and has  subtotal occlusion. Second obtuse marginal and PLV branches are patent.  Right coronary artery is small and nondominant. It is patent.  Left milligrams performed revealed preserved LV systolic function  left-ventricular ejection fraction 65%.   Plan: Medical therapy for very small caliber subtotal occlusion of OM1 branch.  Aspirin, statin and ACE inhibitor's. Beta-blockers not used due to  history of cocaine use.  Aggressive control of his blood pressure and diabetes.  Rehab for cocaine use.  Scheduled Meds: . amLODipine  10 mg Oral Daily  . aspirin  81 mg Oral Daily  . atorvastatin  40 mg Oral QHS  . carvedilol  6.25 mg Oral BID WC  . heparin  5,000 Units Subcutaneous Q8H  . insulin aspart  0-5 Units Subcutaneous QHS  . insulin aspart  0-9 Units Subcutaneous TID WC  . insulin glargine  20 Units Subcutaneous QHS  . pantoprazole  40 mg Oral Daily  . polyethylene glycol  17 g Oral BID  . pregabalin  75 mg Oral BID  . sacubitril-valsartan  1 tablet Oral BID  . senna-docusate  1 tablet Oral BID  . sertraline  50 mg Oral Daily  . tamsulosin   0.8 mg Oral Daily    Continuous Infusions: . sodium chloride 75 mL/hr at 07/01/20 1652    PRN Meds: acetaminophen, hydrALAZINE, ondansetron (ZOFRAN) IV   IMPRESSION & RECOMMENDATIONS: [name] is a 60 y.o. male whose past medical history and cardiac risk factors include: known CAD with subtotal occlusion of OM1 (cath 06/2019 at [facility]), Hypertension, family history of premature CAD, hyperlipidemia, HFpEF, diabetes mellitus type 2, history of CVA with right-sided residual deficits, history of polysubstance abuse, obesity due to excess calories.  Impression: Precordial/epigastric discomfort Abnormal EKG Acute kidney injury Benign essential hypertension with chronic kidney disease 3a, improving Chronic HFpEF Hyperlipidemia-uncontrolled recent LDL 224 Non-insulin-dependent diabetes mellitus type 2-uncontrolled recent A1c 9.1 History of CVA with right-sided residual deficits (September 2021) Aortic atherosclerosis Small pericardial effusion History of polysubstance abuse  Plan: Patient recently discharged from the hospital after being evaluated for chest pain and now readmitted for similar discomfort as well as epigastric pain.  From a cardiovascular standpoint EKG showed normal sinus rhythm with T wave inversions in the high lateral lateral leads, high sensitive troponins relatively flat, low suspicion for ACS.  T wave changes may be secondary to hypertensive emergency on presentation with a systolic blood pressure of 191/117 and prior cath results noted subtotal occlusion of OM1.  During the last hospitalization he underwent extensive cardiovascular noninvasive work-up including an echo and nuclear stress test.  Nuclear stress test noted no reversible perfusion defect and overall a low risk scan.  However, patient has multiple cardiovascular risk factors including uncontrolled diabetes, history of CVA, and uncontrolled hyperlipidemia (LDL 224 mg/dL) and therefore balanced  ischemia cannot be ruled out.  Uptitrated antihypertensive medications and increased antianginal therapy symptoms have improved and I suspect that the underlying epigastric discomfort is more likely GI in etiology as opposed to cardiac in nature. This concern clearly mentioned to the patient.   We discussed additional work-up including left heart catheterization and patient states that he would like to proceed.  The procedure of left heart catheterization with possible intervention was explained to the patient in detail.  The indication, alternatives, risks and benefits were reviewed.  Complications include but not limited to bleeding, infection, vascular injury, stroke, myocardial infection, arrhythmia, kidney  injury requiring hemodialysis, radiation-related injury in the case of prolonged fluoroscopy use, emergency cardiac surgery, and death. The patient understands the risks of serious complication is 1-2 in 9024 with diagnostic cardiac cath and 1-2% or less with angioplasty/stenting. The patient voices understanding and provides verbal feedback and wishes to proceed with coronary angiography with possible PCI.  With the anticipated left heart catheterization during hospitalization and recent serum creatinine level we will start 0.9 normal saline at 75 cc an hour for now.  Once kidney function is stable we will consider the addition of [place].  Patient's questions and concerns were addressed to his satisfaction. He voices understanding of the instructions provided during this encounter.   This note was created using a voice recognition software as a result there may be grammatical errors inadvertently enclosed that do not reflect the nature of this encounter. Every attempt is made to correct such errors.  Rex Kras, DO, [facility]. [place] Office: [phone] 07/01/2020, 8:37 PM

## 2020-07-02 ENCOUNTER — Observation Stay (HOSPITAL_COMMUNITY): Payer: Medicare HMO

## 2020-07-02 DIAGNOSIS — E7800 Pure hypercholesterolemia, unspecified: Secondary | ICD-10-CM | POA: Diagnosis not present

## 2020-07-02 DIAGNOSIS — N179 Acute kidney failure, unspecified: Secondary | ICD-10-CM | POA: Diagnosis not present

## 2020-07-02 DIAGNOSIS — Z20822 Contact with and (suspected) exposure to covid-19: Secondary | ICD-10-CM | POA: Diagnosis not present

## 2020-07-02 DIAGNOSIS — I69351 Hemiplegia and hemiparesis following cerebral infarction affecting right dominant side: Secondary | ICD-10-CM | POA: Diagnosis not present

## 2020-07-02 DIAGNOSIS — R1013 Epigastric pain: Secondary | ICD-10-CM

## 2020-07-02 DIAGNOSIS — R9431 Abnormal electrocardiogram [ECG] [EKG]: Secondary | ICD-10-CM | POA: Diagnosis not present

## 2020-07-02 DIAGNOSIS — G459 Transient cerebral ischemic attack, unspecified: Secondary | ICD-10-CM | POA: Diagnosis not present

## 2020-07-02 DIAGNOSIS — I25118 Atherosclerotic heart disease of native coronary artery with other forms of angina pectoris: Secondary | ICD-10-CM | POA: Diagnosis not present

## 2020-07-02 DIAGNOSIS — I5032 Chronic diastolic (congestive) heart failure: Secondary | ICD-10-CM

## 2020-07-02 DIAGNOSIS — Q438 Other specified congenital malformations of intestine: Secondary | ICD-10-CM | POA: Diagnosis not present

## 2020-07-02 DIAGNOSIS — I700 Atherosclerosis of aorta: Secondary | ICD-10-CM

## 2020-07-02 DIAGNOSIS — E1165 Type 2 diabetes mellitus with hyperglycemia: Secondary | ICD-10-CM | POA: Diagnosis not present

## 2020-07-02 DIAGNOSIS — E119 Type 2 diabetes mellitus without complications: Secondary | ICD-10-CM

## 2020-07-02 DIAGNOSIS — D72819 Decreased white blood cell count, unspecified: Secondary | ICD-10-CM | POA: Diagnosis not present

## 2020-07-02 DIAGNOSIS — E1122 Type 2 diabetes mellitus with diabetic chronic kidney disease: Secondary | ICD-10-CM | POA: Diagnosis not present

## 2020-07-02 DIAGNOSIS — I313 Pericardial effusion (noninflammatory): Secondary | ICD-10-CM | POA: Diagnosis not present

## 2020-07-02 DIAGNOSIS — I130 Hypertensive heart and chronic kidney disease with heart failure and stage 1 through stage 4 chronic kidney disease, or unspecified chronic kidney disease: Secondary | ICD-10-CM | POA: Diagnosis not present

## 2020-07-02 DIAGNOSIS — E871 Hypo-osmolality and hyponatremia: Secondary | ICD-10-CM | POA: Diagnosis not present

## 2020-07-02 DIAGNOSIS — R072 Precordial pain: Secondary | ICD-10-CM

## 2020-07-02 DIAGNOSIS — R202 Paresthesia of skin: Secondary | ICD-10-CM | POA: Diagnosis not present

## 2020-07-02 DIAGNOSIS — I160 Hypertensive urgency: Secondary | ICD-10-CM | POA: Diagnosis not present

## 2020-07-02 DIAGNOSIS — N189 Chronic kidney disease, unspecified: Secondary | ICD-10-CM | POA: Diagnosis not present

## 2020-07-02 DIAGNOSIS — H538 Other visual disturbances: Secondary | ICD-10-CM | POA: Diagnosis not present

## 2020-07-02 DIAGNOSIS — I161 Hypertensive emergency: Secondary | ICD-10-CM | POA: Diagnosis not present

## 2020-07-02 LAB — CBC WITH DIFFERENTIAL/PLATELET
Abs Immature Granulocytes: 0.01 10*3/uL (ref 0.00–0.07)
Basophils Absolute: 0 10*3/uL (ref 0.0–0.1)
Basophils Relative: 1 %
Eosinophils Absolute: 0.1 10*3/uL (ref 0.0–0.5)
Eosinophils Relative: 5 %
HCT: 32.6 % — ABNORMAL LOW (ref 39.0–52.0)
Hemoglobin: 10 g/dL — ABNORMAL LOW (ref 13.0–17.0)
Immature Granulocytes: 0 %
Lymphocytes Relative: 52 %
Lymphs Abs: 1.5 10*3/uL (ref 0.7–4.0)
MCH: 24.5 pg — ABNORMAL LOW (ref 26.0–34.0)
MCHC: 30.7 g/dL (ref 30.0–36.0)
MCV: 79.9 fL — ABNORMAL LOW (ref 80.0–100.0)
Monocytes Absolute: 0.4 10*3/uL (ref 0.1–1.0)
Monocytes Relative: 13 %
Neutro Abs: 0.8 10*3/uL — ABNORMAL LOW (ref 1.7–7.7)
Neutrophils Relative %: 29 %
Platelets: 230 10*3/uL (ref 150–400)
RBC: 4.08 MIL/uL — ABNORMAL LOW (ref 4.22–5.81)
RDW: 14 % (ref 11.5–15.5)
Smear Review: ADEQUATE
WBC: 2.8 10*3/uL — ABNORMAL LOW (ref 4.0–10.5)
nRBC: 0 % (ref 0.0–0.2)

## 2020-07-02 LAB — BASIC METABOLIC PANEL
Anion gap: 4 — ABNORMAL LOW (ref 5–15)
BUN: 20 mg/dL (ref 6–20)
CO2: 25 mmol/L (ref 22–32)
Calcium: 8 mg/dL — ABNORMAL LOW (ref 8.9–10.3)
Chloride: 103 mmol/L (ref 98–111)
Creatinine, Ser: 1.61 mg/dL — ABNORMAL HIGH (ref 0.61–1.24)
GFR, Estimated: 49 mL/min — ABNORMAL LOW (ref >60)
Glucose, Bld: 228 mg/dL — ABNORMAL HIGH (ref 70–99)
Potassium: 4.3 mmol/L (ref 3.5–5.1)
Sodium: 132 mmol/L — ABNORMAL LOW (ref 135–145)

## 2020-07-02 LAB — GLUCOSE, CAPILLARY
Glucose-Capillary: 206 mg/dL — ABNORMAL HIGH (ref 70–99)
Glucose-Capillary: 239 mg/dL — ABNORMAL HIGH (ref 70–99)
Glucose-Capillary: 195 mg/dL — ABNORMAL HIGH (ref 70–99)
Glucose-Capillary: 174 mg/dL — ABNORMAL HIGH (ref 70–99)

## 2020-07-02 LAB — TROPONIN I (HIGH SENSITIVITY)
Troponin I (High Sensitivity): 14 ng/L (ref <18)
Troponin I (High Sensitivity): 14 ng/L (ref <18)

## 2020-07-02 MED ORDER — INSULIN GLARGINE 100 UNIT/ML ~~LOC~~ SOLN
25.0000 [IU] | Freq: Every day | SUBCUTANEOUS | Status: DC
  Administered 2020-07-02 – 2020-07-03 (×2): 25 [IU] via SUBCUTANEOUS
  Filled 2020-07-02 (×4): qty 0.25

## 2020-07-02 MED ORDER — NITROGLYCERIN 0.4 MG SL SUBL
SUBLINGUAL_TABLET | SUBLINGUAL | Status: AC
  Filled 2020-07-02: qty 1

## 2020-07-02 MED ORDER — NITROGLYCERIN 0.4 MG SL SUBL
0.4000 mg | SUBLINGUAL_TABLET | SUBLINGUAL | Status: DC | PRN
  Administered 2020-07-02 (×2): 0.4 mg via SUBLINGUAL

## 2020-07-02 MED ORDER — LORAZEPAM 2 MG/ML IJ SOLN
1.0000 mg | Freq: Once | INTRAMUSCULAR | Status: AC
  Administered 2020-07-02: 1 mg via INTRAVENOUS
  Filled 2020-07-02: qty 1

## 2020-07-02 NOTE — Consult Note (Signed)
Neurology Consultation  Reason for Consult: Acute onset of blurred vision  Referring Physician: Dr. Karleen Hampshire  CC: Acute left visual field blurry vision  History is obtained from: Patient, Inpatient RN, Rapid response RN, Chart review  HPI: [name] is a 60 y.o. male with a medical history significant for uncontrolled diabetes mellitus, hypertension, hyperlipidemia, chronic kidney disease stage IV, multiple CVAs with residual right-sided weakness, coronary artery disease, pukmonary artery hypertension, congestive heart failure, cocaine abuse, and bipolar affective disorder who presented to the ED 06/30/2020 for evaluation of recurrent chest pain and shortness of breath. He was admitted and planned for a cardiac catheterization tomorrow 07/03/2020, when he began to experience chest pain this morning. His RN administered sublingual nitroglycerin x 2 for chest pain. After the second dose of NTG the patient began to complain of blurry vision in his left visual hemifield of both eyes, together with dysarthria. Per Rapid Response RN, patient's systolic blood pressure prior to nitroglycerin was in the 130's and after the second SL Nitro it was 100. The blood pressure began to increase following the second NTG dose as the patient was complaining of blurred vision in the left visual filed. A Code Stroke was then activated.   LKW: 2637 tpa given?: No, inconsistent reporting of symptoms with exam findings showing blurred vision in left hemifields but no vision loss. No aphasia, facial droop, new limb weakness or lateralized sensory deficit appreciated. Minimal dysarthria is noted in the context of missing dentition. Etiology for transient symptoms most likely secondary to transient cerebral hypoperfusion due to lowered BP from nitroglycerin treatment of CP.  IR Thrombectomy? No, presentation not consistent with LVO Modified Rankin Scale: 3-Moderate disability-requires help but walks WITHOUT assistance  ROS: A  complete ROS was performed and is negative except as noted in the HPI.   Past Medical History:  Diagnosis Date  . Arthritis   . Bipolar affective ([place])   . CHF (congestive heart failure) ([place])   . Depression   . Diabetes mellitus    uncontrolled   . Hypertension    Past Surgical History:  Procedure Laterality Date  . BOWEL DECOMPRESSION N/A 01/19/2020   Procedure: BOWEL DECOMPRESSION;  Surgeon: Otis Brace, MD;  Location: [facility];  Service: Gastroenterology;  Laterality: N/A;  . COLONOSCOPY WITH PROPOFOL N/A 01/19/2020   Procedure: COLONOSCOPY WITH PROPOFOL;  Surgeon: Otis Brace, MD;  Location: [facility];  Service: Gastroenterology;  Laterality: N/A;  . KNEE SURGERY     b/l knees; pending total knee    Family History  Problem Relation Age of Onset  . Diabetes Other   . Cancer Mother        uterus; deceased   . Heart attack Mother 23  . Hypertension Other   . Heart attack Father 72  . Stroke Sister        12/2012 age 26 y.o   Social History:   reports that he has quit smoking. He has never used smokeless tobacco. He reports current drug use. Drug: Cocaine. He reports that he does not drink alcohol.  Medications  Current Facility-Administered Medications:  .  0.9 %  sodium chloride infusion, , Intravenous, Continuous, Tolia, Sunit, DO, Last Rate: 75 mL/hr at 07/02/20 1203, Infusion Verify at 07/02/20 1203 .  acetaminophen (TYLENOL) tablet 650 mg, 650 mg, Oral, Q4H PRN, Eulogio Bear U, DO, 650 mg at 06/30/20 1851 .  amLODipine (NORVASC) tablet 10 mg, 10 mg, Oral, Daily, Vann, Jessica U, DO, 10 mg at 07/02/20 0915 .  aspirin chewable tablet 81 mg, 81 mg, Oral, Daily, Tolia, Sunit, DO, 81 mg at 07/02/20 0833 .  atorvastatin (LIPITOR) tablet 40 mg, 40 mg, Oral, QHS, Tolia, Sunit, DO, 40 mg at 07/01/20 2238 .  carvedilol (COREG) tablet 6.25 mg, 6.25 mg, Oral, BID WC, Tolia, Sunit, DO, 6.25 mg at 07/02/20 0831 .  heparin injection 5,000 Units, 5,000 Units,  Subcutaneous, Q8H, Vann, Jessica U, DO, 5,000 Units at 07/02/20 0600 .  hydrALAZINE (APRESOLINE) tablet 25 mg, 25 mg, Oral, Q8H PRN, Hosie Poisson, MD, 25 mg at 07/01/20 0928 .  insulin aspart (novoLOG) injection 0-5 Units, 0-5 Units, Subcutaneous, QHS, Hosie Poisson, MD, 2 Units at 07/01/20 2241 .  insulin aspart (novoLOG) injection 0-9 Units, 0-9 Units, Subcutaneous, TID WC, Hosie Poisson, MD, 3 Units at 07/02/20 1227 .  insulin glargine (LANTUS) injection 20 Units, 20 Units, Subcutaneous, QHS, Hosie Poisson, MD, 20 Units at 07/01/20 2240 .  nitroGLYCERIN (NITROSTAT) SL tablet 0.4 mg, 0.4 mg, Sublingual, Q5 min PRN, Hosie Poisson, MD, 0.4 mg at 07/02/20 1030 .  ondansetron (ZOFRAN) injection 4 mg, 4 mg, Intravenous, Q6H PRN, Vann, Jessica U, DO, 4 mg at 07/01/20 0503 .  pantoprazole (PROTONIX) EC tablet 40 mg, 40 mg, Oral, Daily, Vann, Jessica U, DO, 40 mg at 07/02/20 0833 .  polyethylene glycol (MIRALAX / GLYCOLAX) packet 17 g, 17 g, Oral, BID, Vann, Jessica U, DO .  pregabalin (LYRICA) capsule 75 mg, 75 mg, Oral, BID, Vann, Jessica U, DO, 75 mg at 07/02/20 7253 .  sacubitril-valsartan (ENTRESTO) 49-51 mg per tablet, 1 tablet, Oral, BID, Eulogio Bear U, DO, 1 tablet at 07/02/20 0914 .  senna-docusate (Senokot-S) tablet 1 tablet, 1 tablet, Oral, BID, Geradine Girt, DO, 1 tablet at 07/02/20 6644 .  sertraline (ZOLOFT) tablet 50 mg, 50 mg, Oral, Daily, Vann, Jessica U, DO .  tamsulosin (FLOMAX) capsule 0.8 mg, 0.8 mg, Oral, Daily, Vann, Jessica U, DO, 0.8 mg at 07/02/20 0347  Exam: Current vital signs: BP (!) 153/103 (BP Location: Right Arm)   Pulse 70   Temp 97.6 F (36.4 C) (Oral)   Resp 15   Ht 5\' 8"  (4.259 m)   Wt 115 kg   SpO2 100%   BMI 38.55 kg/m  Vital signs in last 24 hours: Temp:  [97.6 F (36.4 C)-97.8 F (36.6 C)] 97.6 F (36.4 C) (05/08 1220) Pulse Rate:  [69-82] 70 (05/08 1220) Resp:  [11-23] 15 (05/08 1220) BP: (100-166)/(73-112) 153/103 (05/08 1220) SpO2:  [96  %-100 %] 100 % (05/08 1220)  GENERAL: Awake, alert, in no acute distress Head: Normocephalic and atraumatic, dry mm EENT: Poor dentition, no OP obstruction, normal conjunctivae LUNGS: Normal respiratory effort. Non-labored breathing CV: Regular rate on telemetry ABDOMEN: Soft, non-tender Ext: warm, without obvious deformity  NEURO:  Mental Status: Awake, alert, and oriented to person, place, time, and situation. He is able to provide a clear and coherent history of present illness.  Speech/Language: speech is minimally dysarthric and patient states that it is different than his baseline speech but patient also has mild dysarthria at baseline 2/2 poor dentition. No aphasia noted.  Naming, repetition, fluency, and comprehension intact. No neglect present.  Cranial Nerves:  II: PERRL 3 mm/brisk. Visual fields full but with subjective blurry vision in left visual fields OU bilaterally III, IV, VI: EOMI without ptosis V: Sensation is intact to light touch and symmetrical to face.  VII: Face is symmetric resting and smiling.  VIII: Hearing is intact to voice IX, X:  Palate elevation is symmetric. Phonation normal.  XI: Normal sternocleidomastoid and trapezius muscle strength XII: Tongue protrudes midline without fasciculations.   Motor: Generalized weakness at baseline with right-sided deficits from previous stroke. Right upper extremity 4-/5 strength, left upper extremity 4/5 strength. Left lower extremity 4/5 strength with right lower extremity 3/5 strength. Patient able to sustain antigravity movement without vertical drift on the LUE and LLE. Tone is normal. Bulk is normal.  Sensation: Intact to light touch bilaterally in all four extremities. Sensation to cool temperature absent in bilateral upper extremities but present and symmetric on the face.   Coordination: FTN intact bilaterally. HKS unable to assess 2/2 weakness.  Plantars: Toes upgoing bilaterally Gait: Deferred  NIHSS: 1a Level  of Conscious.: 0 1b LOC Questions: 0 1c LOC Commands: 0 2 Best Gaze: 0 3 Visual: 0 4 Facial Palsy: 0 5a Motor Arm - left: 0 5b Motor Arm - Right: 1 6a Motor Leg - Left: 0 6b Motor Leg - Right: 1 7 Limb Ataxia: 0 8 Sensory: 0 9 Best Language: 0 10 Dysarthria: 1 11 Extinct. and Inatten.: 0 TOTAL: 3; baseline NIHSS of 2 due to right hemibody weakness from previous stroke  Labs I have reviewed labs in epic and the results pertinent to this consultation are: CBC    Component Value Date/Time   WBC 2.8 (L) 07/02/2020 0126   RBC 4.08 (L) 07/02/2020 0126   HGB 10.0 (L) 07/02/2020 0126   HGB 13.9 12/31/2013 1336   HCT 32.6 (L) 07/02/2020 0126   HCT 43.0 12/31/2013 1336   PLT 230 07/02/2020 0126   PLT 184 12/31/2013 1336   MCV 79.9 (L) 07/02/2020 0126   MCV 81 12/31/2013 1336   MCH 24.5 (L) 07/02/2020 0126   MCHC 30.7 07/02/2020 0126   RDW 14.0 07/02/2020 0126   RDW 14.4 12/31/2013 1336   LYMPHSABS 1.5 07/02/2020 0126   MONOABS 0.4 07/02/2020 0126   EOSABS 0.1 07/02/2020 0126   BASOSABS 0.0 07/02/2020 0126   CMP     Component Value Date/Time   NA 132 (L) 07/02/2020 0126   NA 135 (L) 12/31/2013 1336   K 4.3 07/02/2020 0126   K 3.8 12/31/2013 1336   CL 103 07/02/2020 0126   CL 101 12/31/2013 1336   CO2 25 07/02/2020 0126   CO2 25 12/31/2013 1336   GLUCOSE 228 (H) 07/02/2020 0126   GLUCOSE 249 (H) 12/31/2013 1336   BUN 20 07/02/2020 0126   BUN 8 12/31/2013 1336   CREATININE 1.61 (H) 07/02/2020 0126   CREATININE 0.88 12/31/2013 1336   CALCIUM 8.0 (L) 07/02/2020 0126   CALCIUM 8.6 12/31/2013 1336   PROT 5.7 (L) 06/30/2020 0039   PROT 7.8 12/31/2013 1336   ALBUMIN 1.8 (L) 06/30/2020 0039   ALBUMIN 3.4 12/31/2013 1336   AST 15 06/30/2020 0039   AST 23 12/31/2013 1336   ALT 10 06/30/2020 0039   ALT 20 12/31/2013 1336   ALKPHOS 76 06/30/2020 0039   ALKPHOS 76 12/31/2013 1336   BILITOT 0.5 06/30/2020 0039   BILITOT 0.6 12/31/2013 1336   GFRNONAA 49 (L) 07/02/2020  0126   GFRNONAA >60 12/31/2013 1336   GFRAA >60 01/29/2018 1705   GFRAA >60 12/31/2013 1336   Lipid Panel     Component Value Date/Time   CHOL 305 (H) 06/25/2020 0711   TRIG 90 06/25/2020 0711   HDL 63 06/25/2020 0711   CHOLHDL 4.8 06/25/2020 0711   VLDL 18 06/25/2020 0711   LDLCALC 224 (H)  06/25/2020 0711   Lab Results  Component Value Date   HGBA1C 9.1 (H) 06/25/2020   Imaging I have reviewed the images obtained:  CT-scan of the brain: 1. Evidence of small vessel ischemia in the left caudate and the left thalamus, with the latter more apparent than on a head CT 1 week ago. 2. No acute cortically based infarct or acute intracranial hemorrhage identified. ASPECTS 10.  TTE: 1. Left ventricular ejection fraction, by estimation, is 60 to 65%. The  left ventricle has normal function. The left ventricle has no regional  wall motion abnormalities. There is severe left ventricular hypertrophy.  Left ventricular diastolic parameters are consistent with Grade I diastolic dysfunction (impaired relaxation).  2. Right ventricular systolic function is normal. The right ventricular  size is normal. There is moderately elevated pulmonary artery systolic  pressure. The estimated right ventricular systolic pressure is 16.3 mmHg.  3. Left atrial size was moderately dilated.  4. A small pericardial effusion is present. The pericardial effusion is circumferential. There is no evidence of cardiac tamponade.  5. The mitral valve is normal in structure. Mild to moderate mitral valve regurgitation. No evidence of mitral stenosis.  6. The aortic valve is calcified. There is moderate thickening of the aortic valve. Aortic valve regurgitation is mild. Mild aortic valve stenosis. Aortic valve area, by VTI measures 1.26 cm. Aortic valve mean gradient measures 14.0 mmHg. Aortic valve Vmax measures 2.48 m/s.  7. The inferior vena cava is dilated in size with <50% respiratory variability, suggesting right  atrial pressure of 15 mmHg.  8. There is a small patent foramen ovale.   Assessment: 60 year old male with extensive PMHx as above, initially admitted for evaluation of chest pain, who developed left hemifield visual deficit with hypotension this morning following 2 doses of sublingual nitroglycerin.  - Examination reveals patient with left hemifield blurred vision with subjective improvement following CT imaging. He also complains of slurred speech from baseline- not appreciated by bedside RN. Examination revealed generalized weakness (per patient: baseline weakness) right weaker than left. - CT imaging without acute intracranial abnormality.  - Etiology likely cerebral hypoperfusion / TIA due to hypotension associated with SL nitroglycerin administration  - LDL obtained 06/25/20 with significant elevation at 224; goal < 70. On atorvastatin 40 mg PO daily  - Stroke risk factors: Uncontrolled diabetes mellitus, hypertension, hyperlipidemia, chronic kidney disease, multiple CVAs, coronary artery disease, congestive heart failure and cocaine abuse - Avoid hypotension.   Impression: - Acute vision disturbance with hypotensive episode - History of CVAs with residual right-sided deficits  Recommendations: - MRI brain without contrast;  - MRA head - Carotid ultrasound - Frequent neuro checks - Consider intensifying atorvastatin with significantly elevated LDL at 242 - Prophylactic therapy- Antiplatelet med: Aspirin - continue aspirin 81 mg PO daily - Risk factor modification - Telemetry monitoring - PT consult, OT consult, Speech consult  Pt seen by NP/Neuro  Anibal Henderson, AGAC-NP [facility] Pager: [phone]  I have seen and examined the patient. I have formulated the assessment and recommendations. My examination findings were observed and documented by Anibal Henderson, NP.  Electronically signed: Dr. Kerney Elbe

## 2020-07-02 NOTE — Significant Event (Signed)
Rapid Response Event Note   Reason for Call :  Chest pain and sensation of swelling in mouth  Initial Focused Assessment:  The patient stated that he has been experiencing chest pain on his right side.  He describes the chest pain as being "sharp" and "stabbing".  He rates his chest pain at 8/10. He also describes that he has sensation that his mouth is swollen.    The patient was mentioning that he felt that his speech was different and he felt like he was having trouble catching his words.  He also endorsed that he was having a visual change in his left eye.  He stated that he was having blurred vision that came on suddenly.  Code stroke called at 1057  BP 100/73 ECG 71 O2 99 room air RR 13 CBG 239   Interventions:  SL nitro x 2 given at 1022 and 1027. Code stroke called at 1057 EKG obtained Vitals taken  Plan of Care:  Permissive HTN for this patient is allowed   Event Summary:   MD Notified: Karleen Hampshire Call Time: [place] Time: 1000 End Time: [place], RN

## 2020-07-02 NOTE — Progress Notes (Addendum)
Progress Note  Patient Name: [name] Date of Encounter: 07/02/2020  Attending physician: Hosie Poisson, MD Primary care provider: [name], NP  Subjective: [name] is a 60 y.o. male who was seen and examined at bedside at 130pm Patient did well overnight however around 1030/10:45 AM started experiencing right-sided chest pain followed by jaw pain and blurred vision and tingling in his mouth.  He was given sublingual nitroglycerin tablets x2 and the chest pain resolved.  However due to the patient's change in tingling sensation in the mouth code stroke was initiated and is currently being evaluated.  In the time of evaluation is sitting upright watching TV and does not complain of any chest pain.  Objective: Vital Signs in the last 24 hours: Temp:  [97.6 F (36.4 C)-97.8 F (36.6 C)] 97.6 F (36.4 C) (05/08 1220) Pulse Rate:  [69-82] 73 (05/08 1358) Resp:  [11-23] 13 (05/08 1358) BP: (100-166)/(73-112) 164/104 (05/08 1358) SpO2:  [96 %-100 %] 100 % (05/08 1358)  Intake/Output:  Intake/Output Summary (Last 24 hours) at 07/02/2020 1408 Last data filed at 07/02/2020 1203 Gross per 24 hour  Intake 1074.99 ml  Output 1650 ml  Net -575.01 ml    Net IO Since Admission: -1,670.01 mL [07/02/20 1408]  Weights:  Filed Weights   06/30/20 0031  Weight: 115 kg    Telemetry: Personally reviewed - NSR  Physical examination: PHYSICAL EXAM: Vitals with BMI 07/02/2020 07/02/2020 07/02/2020  Height - - -  Weight - - -  BMI - - -  Systolic [phone]  Diastolic 546 503 546  Pulse 73 71 70  Some encounter information is confidential and restricted. Go to Review Flowsheets activity to see all data.    CONSTITUTIONAL: Hemodynamically stable, appears older than stated age, no acute distress.    SKIN: Skin is warm and dry. No rash noted. No cyanosis. No pallor. No jaundice HEAD: Normocephalic and atraumatic.  EYES: No scleral icterus MOUTH/THROAT: Dry oral membranes.   NECK: No JVD present. No thyromegaly noted. No carotid bruits  LYMPHATIC: No visible cervical adenopathy.  CHEST Normal respiratory effort. No intercostal retractions  LUNGS: Clear to auscultation bilaterally.  No stridor. No wheezes. No rales.  CARDIOVASCULAR: Regular, positive F6-C1, soft holosystolic murmur heard at the apex, no gallops or rubs. ABDOMINAL: Obese, soft, tender over the epigastric region, nondistended, positive bowel sounds in all 4 quadrants no apparent ascites.  EXTREMITIES: Trace bilateral peripheral edema, poor skin hygiene HEMATOLOGIC: No significant bruising NEUROLOGIC: Oriented to person, place, and time.  Weakness in the right upper and lower extremity. PSYCHIATRIC: Normal mood and affect. Normal behavior. Cooperative  Lab Results: Hematology Recent Labs  Lab 06/30/20 0040 07/01/20 0245 07/02/20 0126  WBC 3.5* 3.3* 2.8*  RBC 4.62 4.42 4.08*  HGB 11.5* 11.1* 10.0*  HCT 37.4* 35.5* 32.6*  MCV 81.0 80.3 79.9*  MCH 24.9* 25.1* 24.5*  MCHC 30.7 31.3 30.7  RDW 14.1 14.1 14.0  PLT 260 227 230    Chemistry Recent Labs  Lab 06/27/20 0301 06/28/20 0309 06/30/20 0039 07/01/20 0245 07/02/20 0126  NA  --    < > 135 133* 132*  K  --    < > 3.9 4.0 4.3  CL  --    < > 106 102 103  CO2  --    < > 25 24 25   GLUCOSE  --    < > 170* 202* 228*  BUN  --    < > 17  17 20  CREATININE  --    < > 1.52* 1.69* 1.61*  CALCIUM  --    < > 8.4* 8.2* 8.0*  PROT 5.3*  --  5.7*  --   --   ALBUMIN 1.6*  --  1.8*  --   --   AST 13*  --  15  --   --   ALT 11  --  10  --   --   ALKPHOS 81  --  76  --   --   BILITOT <0.1*  --  0.5  --   --   GFRNONAA  --    < > 53* 46* 49*  ANIONGAP  --    < > 4* 7 4*   < > = values in this interval not displayed.     Cardiac Enzymes: Cardiac Panel (last 3 results) Recent Labs    06/30/20 0336 06/30/20 1247 06/30/20 1443  TROPONINIHS 21* 18* 17    BNP (last 3 results) Recent Labs    12/31/19 2122 06/25/20 0456 06/30/20 0039   BNP 116.8* 168.5* 77.6    ProBNP (last 3 results) No results for input(s): PROBNP in the last 8760 hours.   DDimer No results for input(s): DDIMER in the last 168 hours.   Hemoglobin A1c:  Lab Results  Component Value Date   HGBA1C 9.1 (H) 06/25/2020   MPG 214.47 06/25/2020    TSH  Recent Labs    01/01/20 0849 01/18/20 0517 06/25/20 1244  TSH 1.354 2.317 1.714    Lipid Panel     Component Value Date/Time   CHOL 305 (H) 06/25/2020 0711   TRIG 90 06/25/2020 0711   HDL 63 06/25/2020 0711   CHOLHDL 4.8 06/25/2020 0711   VLDL 18 06/25/2020 0711   LDLCALC 224 (H) 06/25/2020 0711    Imaging: CT HEAD CODE STROKE WO CONTRAST  Result Date: 07/02/2020 CLINICAL DATA:  Code stroke. 60 year old male with vision changes and left side tingling. EXAM: CT HEAD WITHOUT CONTRAST TECHNIQUE: Contiguous axial images were obtained from the base of the skull through the vertex without intravenous contrast. COMPARISON:  Head CT 06/25/2020. FINDINGS: Brain: Asymmetric hypodensity in the left caudate appears stable from the recent CT, but there is also new or definitely increased hypodensity in the left thalamus also on series 3, image 16 today. Compare also coronal image 34 today to image 33 previously. Gray-white matter differentiation is stable elsewhere, with probable perivascular space of the right lentiform on image 14. No midline shift, ventriculomegaly, mass effect, evidence of mass lesion, intracranial hemorrhage or evidence of cortically based acute infarction. Vascular: Calcified atherosclerosis at the skull base. No suspicious intracranial vascular hyperdensity. No suspicious intracranial vascular hyperdensity. Skull: Negative. Sinuses/Orbits: Visualized paranasal sinuses and mastoids are stable and well aerated. Other: Right vertex scalp hematoma or edema has resolved. Visualized orbits and scalp soft tissues are within normal limits. ASPECTS [facility] Stroke Program Early CT Score) Total  score (0-10 with 10 being normal): 10 IMPRESSION: 1. Evidence of small vessel ischemia in the left caudate and the left thalamus, with the latter more apparent than on a Head CT 1 week ago. 2. No acute cortically based infarct or acute intracranial hemorrhage identified. ASPECTS 10. 3. These results were communicated to Dr. Cheral Marker at [place] 5/8/2022by text page via the [facility] messaging system. Electronically Signed   By: Genevie Ann M.D.   On: 07/02/2020 11:15    Cardiac database: EKG: 06/30/2020: Normal sinus rhythm, 91 bpm,  normal axis, T wave inversions in the high lateral lateral leads suggestive of possible ischemia, without underlying injury pattern.  Compared to prior EKG T wave inversions in the lateral leads are new.  07/02/2020: Normal sinus rhythm, 79 bpm, ST-T changes bilaterally.  Without underlying injury pattern no significant change compared to prior EKG.  Echocardiogram: 06/25/2020: 1. Left ventricular ejection fraction, by estimation, is 60 to 65%. The  left ventricle has normal function. The left ventricle has no regional  wall motion abnormalities. There is severe left ventricular hypertrophy.  Left ventricular diastolic parameters  are consistent with Grade I diastolic dysfunction (impaired relaxation).  2. Right ventricular systolic function is normal. The right ventricular  size is normal. There is moderately elevated pulmonary artery systolic  pressure. The estimated right ventricular systolic pressure is 22.2 mmHg.  3. Left atrial size was moderately dilated.  4. A small pericardial effusion is present. The pericardial effusion is  circumferential. There is no evidence of cardiac tamponade.  5. The mitral valve is normal in structure. Mild to moderate mitral valve  regurgitation. No evidence of mitral stenosis.  6. The aortic valve is calcified. There is moderate thickening of the  aortic valve. Aortic valve regurgitation is mild. Mild aortic valve  stenosis.  Aortic valve area, by VTI measures 1.26 cm. Aortic valve mean  gradient measures 14.0 mmHg. Aortic valve  Vmax measures 2.48 m/s.  7. The inferior vena cava is dilated in size with <50% respiratory  variability, suggesting right atrial pressure of 15 mmHg.  8. There is a small patent foramen ovale.   Stress Testing:  Lexiscan 06/27/2020: 1. No reversible ischemia. Probable inferior wall diaphragmatic attenuation.  2. Normal left ventricular wall motion.  3. Left ventricular ejection fraction 50%  4. Non invasive risk stratification*: Low  Heart Catheterization: Left heart catheterization 06/30/2019 ([facility]): Left main is patent.  LAD has mild diffuse irregularity. Diagonal and septal branches are  patent. The left circumflex is a dominant vessel and has mild diffuse  irregularity. Obtuse marginal 1 is a small caliber branch and has  subtotal occlusion. Second obtuse marginal and PLV branches are patent.  Right coronary artery is small and nondominant. It is patent.  Left milligrams performed revealed preserved LV systolic function  left-ventricular ejection fraction 65%.   Plan: Medical therapy for very small caliber subtotal occlusion of OM1 branch.  Aspirin, statin and ACE inhibitor's. Beta-blockers not used due to  history of cocaine use.  Aggressive control of his blood pressure and diabetes.  Rehab for cocaine use.  Scheduled Meds: . amLODipine  10 mg Oral Daily  . aspirin  81 mg Oral Daily  . atorvastatin  40 mg Oral QHS  . carvedilol  6.25 mg Oral BID WC  . heparin  5,000 Units Subcutaneous Q8H  . insulin aspart  0-5 Units Subcutaneous QHS  . insulin aspart  0-9 Units Subcutaneous TID WC  . insulin glargine  20 Units Subcutaneous QHS  . pantoprazole  40 mg Oral Daily  . polyethylene glycol  17 g Oral BID  . pregabalin  75 mg Oral BID  . sacubitril-valsartan  1 tablet Oral BID  . senna-docusate  1 tablet Oral BID  . sertraline  50 mg Oral  Daily  . tamsulosin  0.8 mg Oral Daily    Continuous Infusions: . sodium chloride 75 mL/hr at 07/02/20 1203    PRN Meds: acetaminophen, hydrALAZINE, nitroGLYCERIN, ondansetron (ZOFRAN) IV   IMPRESSION & RECOMMENDATIONS: KOBE OFALLON is a  60 y.o. male whose past medical history and cardiac risk factors include: known CAD with subtotal occlusion of OM1 (cath 06/2019 at [facility]), Hypertension, family history of premature CAD, hyperlipidemia, HFpEF, diabetes mellitus type 2, history of CVA with right-sided residual deficits, history of polysubstance abuse, obesity due to excess calories.  Impression: Right-sided precordial/epigastric discomfort Abnormal EKG Blurred vision Acute kidney injury Benign essential hypertension with chronic kidney disease 3a, improving Chronic HFpEF Hyperlipidemia-uncontrolled recent LDL 224 Non-insulin-dependent diabetes mellitus type 2-uncontrolled recent A1c 9.1 History of CVA with right-sided residual deficits (September 2021) Aortic atherosclerosis Small pericardial effusion History of polysubstance abuse  Plan: Patient recently discharged from the hospital after being evaluated for chest pain and now readmitted for similar discomfort as well as epigastric pain.  From a cardiovascular standpoint EKG showed normal sinus rhythm with T wave inversions in the high lateral & lateral leads, high sensitive troponins relatively flat, low suspicion for ACS.  T wave changes may be secondary to hypertensive emergency on presentation with a systolic blood pressure of 191/117 and prior cath results noted subtotal occlusion of OM1.  During the last hospitalization he underwent extensive cardiovascular noninvasive work-up including an echo and nuclear stress test.  Nuclear stress test noted no reversible perfusion defect and overall a low risk scan.  However, patient has multiple cardiovascular risk factors including uncontrolled diabetes, history of CVA, and  uncontrolled hyperlipidemia (LDL 224 mg/dL) and therefore balanced ischemia cannot be ruled out.  Therefore yesterday we discussed undergoing left heart catheterization with possible intervention.  Patient was agreeable.  Currently scheduled for right heart catheterization tomorrow.  However, due to the recent code stroke activation would like to defer heart catheterization at this time.  Earlier this morning spoke to Saralyn Pilar rapid response nurse over the phone and recommended an EKG and trending troponins and completing stroke work-up.  Recommend completing the stroke work-up.  Patient is asymptomatic from a cardiovascular standpoint at the time of the evaluation.  Blood pressure management.  Neurology and primary team.  Will trend troponins given the rapid response that was initiated earlier this morning.  EKG independently reviewed and noted above for further reference.  Differential diagnosis for his epigastric discomfort may be underlying GI pathology.  He would benefit from a GI evaluation during hospitalization given his leukopenia, anemia, and epigastric discomfort.  Plan of care discussed with attending physician and nursing staff.   Patient's questions and concerns were addressed to his satisfaction. He voices understanding of the instructions provided during this encounter.   This note was created using a voice recognition software as a result there may be grammatical errors inadvertently enclosed that do not reflect the nature of this encounter. Every attempt is made to correct such errors.  CRITICAL CARE Performed by: Rex Kras   Total critical care time: 35 minutes   Critical care time was exclusive of separately billable procedures and treating other patients.   Critical care was necessary to treat or prevent imminent or life-threatening deterioration.   Critical care was time spent personally by me on the following activities: development of treatment plan with patient  and/or surrogate as well as nursing, discussions with consultants, evaluation of patient's response to treatment, examination of patient, obtaining history from patient or surrogate, ordering and performing treatments and interventions, ordering and review of laboratory studies, ordering and review of radiographic studies, pulse oximetry and re-evaluation of patient's condition.  Rex Kras, DO, [facility]. [place] Office: [phone] 07/02/2020, 2:08 PM

## 2020-07-02 NOTE — Progress Notes (Addendum)
Pt reports having 8 out of 10 sharp chest pain. Also expressing difficulty swallowing and discomfort in jaw. Nitroglycerin subq given. Attending paged.

## 2020-07-02 NOTE — Progress Notes (Signed)
PROGRESS NOTE    [name]  VFI:[id number] DOB: [birth year] DOA: 06/30/2020 PCP: [name], NP    Chief Complaint  Patient presents with  . Chest Pain  . Shortness of Breath    Brief Narrative:  60 year old male with history of hypertension, CHF, diabetes mellitus type 2, CVA with residual right-sided weakness, depression, remote polysubstance abuse presents with complaints of intermittent chest pressure. Pt was recently admitted for the same reasons between 06/25/20 and 06/28/20, underwent non invasive test, was negative for acute ischemia and discharged.  But presents for recurrent symptoms. Cardiology consulted and he was initially scheduled for cardiac cath on Monday. But earlier today patient had acute left sided blurry vision,  A code stroke activated for further evaluation. Initial CT head without contrast showed Evidence of small vessel ischemia in the left caudate and the left thalamus, with the latter more apparent than on a Head CT 1 week ago. Follow up MRI brain without contrast ordered for further evaluation.   Assessment & Plan:   Active Problems:   Leukopenia   Diabetes mellitus type 2, uncontrolled (HCC)   Hypertensive urgency   Chest pain   CKD (chronic kidney disease)      Atypical Chest pain:  patient has mild elevation of troponin, EKG showed nonspecific T wave abnormality in lateral leads.  CXR does not show any acute pulm disease.  Echocardiogram shows grade 1 diastolic dysfunction. Moderately elevated pulmonary artery systolic pressure. Cardiology was consulted,  Underwent nuclear stress test on last admission was No reversible ischemia. Probable inferior wall diaphragmatic attenuation. Pt presents 2 days later with similar symptoms, cardiology consulted , initially recommended cardiac cath, but in view of his new deficits and code stroke, and asymptomatic from cardiovascular standpoint  he was taken off schedule for cath tomorrow.  Will call GI in  am for further work up of his atypical chest pain / epigastric pain. Meanwhile continue with aspirin , lipitor, coreg and entresto.     Hypertensive Urgency:  Permissive hypertension for BP till 180/100 mmhg.   Pulmonary artery hypertension- echocardiogram shows moderately reduced pulmonary artery pressure. Patient currently not requiring oxygen. Recommend outpatient follow up with pulmonology for surveillance.     Diabetes mellitus type 2, insulin dependent with stage 2 CKD. Uncontrolled with hyperglycemia.  -hemoglobin A1c is 9.1.  Start the patient on lantus and SSI.  CBG (last 3)  Recent Labs    07/01/20 2128 07/02/20 0742 07/02/20 1152  GLUCAP 246* 206* 239*   Increase lantus to 25 units tonight.    Leukopenia- improving. Unclear etiology.   Iron deficiency anemia- patient's hemoglobin is 11, serum iron studies from November 2021 showed serum iron of 33 with saturation 13%. Patient is followed by [facility]. He had 2 attempts of colonoscopy November 2021 and December 2021 but patient had inadequate prep. Follow-up as outpatient.    Hypoalbuminemia-unclear etiology,  Will need dietary consult and outpatient follow up.   CKD stage II- Creatinine around 1.6 today improved from 1.8, on discharge.    History of CVA with residual deficit- patient has known history of CVA in September 2021 which left him with residual right-sided weakness and mostly wheelchair-bound.   Thyroid nodule- CTA chest shows bilateral thyroid nodules measuring 2.6 cm on the right. TSH 1.714. Will need thyroid ultrasound as outpatient. Follow-up PCP for further work-up.  Mild hyponatremia:  Continue to monitor.     DVT prophylaxis: Heparin.  Code Status: full code.  Family Communication: None at  bedside.  Disposition:   Status is: Observation  The patient will require care spanning > 2 midnights and should be moved to inpatient because: Ongoing diagnostic testing needed not  appropriate for outpatient work up  Dispo: The patient is from: Home              Anticipated d/c is to: ALF              Patient currently is not medically stable to d/c.   Difficult to place patient No       Consultants:   Cardiology.    Procedures: cardiac cath scheduled on Monday.    Antimicrobials: none.    Subjective: No chest pain today.   Objective: Vitals:   07/02/20 1206 07/02/20 1220 07/02/20 1324 07/02/20 1358  BP: (!) 160/97 (!) 153/103 (!) 161/104 (!) 164/104  Pulse: 71 70 71 73  Resp: 14 15 14 13   Temp:  97.6 F (36.4 C)    TempSrc:  Oral    SpO2: 100% 100% 99% 100%  Weight:      Height:        Intake/Output Summary (Last 24 hours) at 07/02/2020 1609 Last data filed at 07/02/2020 1203 Gross per 24 hour  Intake 1074.99 ml  Output 1650 ml  Net -575.01 ml   Filed Weights   06/30/20 0031  Weight: 115 kg    Examination:  General exam: alert and oriented, comfortable.  Respiratory system: clear to auscultation. No wheezing heard.  Cardiovascular system: S1 & S2 heard,RRR no JVD  Pedal edema present.  Gastrointestinal system: Abdomen is soft,non tender bowel sounds wnl.  Central nervous system: Alert and oriented. Blurry vision in the left visual field. Residual left sided weakness.  Extremities: pedal edema present.  Skin: No  Rashes.  Psychiatry:  Mood  Is appropriate.     Data Reviewed: I have personally reviewed following labs and imaging studies  CBC: Recent Labs  Lab 06/27/20 0301 06/30/20 0040 07/01/20 0245 07/02/20 0126  WBC 2.9* 3.5* 3.3* 2.8*  NEUTROABS  --   --   --  0.8*  HGB 10.1* 11.5* 11.1* 10.0*  HCT 32.0* 37.4* 35.5* 32.6*  MCV 78.4* 81.0 80.3 79.9*  PLT 211 260 227 885    Basic Metabolic Panel: Recent Labs  Lab 06/28/20 0309 06/30/20 0039 07/01/20 0245 07/02/20 0126  NA 133* 135 133* 132*  K 4.0 3.9 4.0 4.3  CL 103 106 102 103  CO2 27 25 24 25   GLUCOSE 214* 170* 202* 228*  BUN 19 17 17 20   CREATININE  1.88* 1.52* 1.69* 1.61*  CALCIUM 7.9* 8.4* 8.2* 8.0*    GFR: Estimated Creatinine Clearance: 61.5 mL/min (A) (by C-G formula based on SCr of 1.61 mg/dL (H)).  Liver Function Tests: Recent Labs  Lab 06/27/20 0301 06/30/20 0039  AST 13* 15  ALT 11 10  ALKPHOS 81 76  BILITOT <0.1* 0.5  PROT 5.3* 5.7*  ALBUMIN 1.6* 1.8*    CBG: Recent Labs  Lab 06/28/20 1148 07/01/20 1612 07/01/20 2128 07/02/20 0742 07/02/20 1152  GLUCAP 150* 289* 246* 206* 239*     Recent Results (from the past 240 hour(s))  Resp Panel by RT-PCR (Flu A&B, Covid) Nasopharyngeal Swab     Status: None   Collection Time: 06/25/20  8:32 AM   Specimen: Nasopharyngeal Swab; Nasopharyngeal(NP) swabs in vial transport medium  Result Value Ref Range Status   SARS Coronavirus 2 by RT PCR NEGATIVE NEGATIVE Final    Comment: (NOTE)  SARS-CoV-2 target nucleic acids are NOT DETECTED.  The SARS-CoV-2 RNA is generally detectable in upper respiratory specimens during the acute phase of infection. The lowest concentration of SARS-CoV-2 viral copies this assay can detect is 138 copies/mL. A negative result does not preclude SARS-Cov-2 infection and should not be used as the sole basis for treatment or other patient management decisions. A negative result may occur with  improper specimen collection/handling, submission of specimen other than nasopharyngeal swab, presence of viral mutation(s) within the areas targeted by this assay, and inadequate number of viral copies(<138 copies/mL). A negative result must be combined with clinical observations, patient history, and epidemiological information. The expected result is Negative.  Fact Sheet for Patients:  EntrepreneurPulse.com.au  Fact Sheet for Healthcare Providers:  IncredibleEmployment.be  This test is no t yet approved or cleared by the [place] FDA and  has been authorized for detection and/or diagnosis of SARS-CoV-2  by FDA under an Emergency Use Authorization (EUA). This EUA will remain  in effect (meaning this test can be used) for the duration of the COVID-19 declaration under Section 564(b)(1) of the Act, 21 U.S.C.section 360bbb-3(b)(1), unless the authorization is terminated  or revoked sooner.       Influenza A by PCR NEGATIVE NEGATIVE Final   Influenza B by PCR NEGATIVE NEGATIVE Final    Comment: (NOTE) The Xpert Xpress SARS-CoV-2/FLU/RSV plus assay is intended as an aid in the diagnosis of influenza from Nasopharyngeal swab specimens and should not be used as a sole basis for treatment. Nasal washings and aspirates are unacceptable for Xpert Xpress SARS-CoV-2/FLU/RSV testing.  Fact Sheet for Patients: EntrepreneurPulse.com.au  Fact Sheet for Healthcare Providers: IncredibleEmployment.be  This test is not yet approved or cleared by the [place] FDA and has been authorized for detection and/or diagnosis of SARS-CoV-2 by FDA under an Emergency Use Authorization (EUA). This EUA will remain in effect (meaning this test can be used) for the duration of the COVID-19 declaration under Section 564(b)(1) of the Act, 21 U.S.C. section 360bbb-3(b)(1), unless the authorization is terminated or revoked.  Performed at [facility], [place] [address], [place], [place] [address]   SARS CORONAVIRUS 2 (TAT 6-24 HRS) Nasopharyngeal Nasopharyngeal Swab     Status: None   Collection Time: 06/30/20 12:47 PM   Specimen: Nasopharyngeal Swab  Result Value Ref Range Status   SARS Coronavirus 2 NEGATIVE NEGATIVE Final    Comment: (NOTE) SARS-CoV-2 target nucleic acids are NOT DETECTED.  The SARS-CoV-2 RNA is generally detectable in upper and lower respiratory specimens during the acute phase of infection. Negative results do not preclude SARS-CoV-2 infection, do not rule out co-infections with other pathogens, and should not be used as the sole basis for  treatment or other patient management decisions. Negative results must be combined with clinical observations, patient history, and epidemiological information. The expected result is Negative.  Fact Sheet for Patients: SugarRoll.be  Fact Sheet for Healthcare Providers: https://www.woods-mathews.com/  This test is not yet approved or cleared by the [place] FDA and  has been authorized for detection and/or diagnosis of SARS-CoV-2 by FDA under an Emergency Use Authorization (EUA). This EUA will remain  in effect (meaning this test can be used) for the duration of the COVID-19 declaration under Se ction 564(b)(1) of the Act, 21 U.S.C. section 360bbb-3(b)(1), unless the authorization is terminated or revoked sooner.  Performed at [facility], [place] [address], [place], [place] [address]  Radiology Studies: CT HEAD CODE STROKE WO CONTRAST  Result Date: 07/02/2020 CLINICAL DATA:  Code stroke. 60 year old male with vision changes and left side tingling. EXAM: CT HEAD WITHOUT CONTRAST TECHNIQUE: Contiguous axial images were obtained from the base of the skull through the vertex without intravenous contrast. COMPARISON:  Head CT 06/25/2020. FINDINGS: Brain: Asymmetric hypodensity in the left caudate appears stable from the recent CT, but there is also new or definitely increased hypodensity in the left thalamus also on series 3, image 16 today. Compare also coronal image 34 today to image 33 previously. Gray-white matter differentiation is stable elsewhere, with probable perivascular space of the right lentiform on image 14. No midline shift, ventriculomegaly, mass effect, evidence of mass lesion, intracranial hemorrhage or evidence of cortically based acute infarction. Vascular: Calcified atherosclerosis at the skull base. No suspicious intracranial vascular hyperdensity. No suspicious intracranial vascular hyperdensity. Skull:  Negative. Sinuses/Orbits: Visualized paranasal sinuses and mastoids are stable and well aerated. Other: Right vertex scalp hematoma or edema has resolved. Visualized orbits and scalp soft tissues are within normal limits. ASPECTS [facility] Stroke Program Early CT Score) Total score (0-10 with 10 being normal): 10 IMPRESSION: 1. Evidence of small vessel ischemia in the left caudate and the left thalamus, with the latter more apparent than on a Head CT 1 week ago. 2. No acute cortically based infarct or acute intracranial hemorrhage identified. ASPECTS 10. 3. These results were communicated to Dr. Cheral Marker at [place] 5/8/2022by text page via the [facility] messaging system. Electronically Signed   By: Genevie Ann M.D.   On: 07/02/2020 11:15        Scheduled Meds: . amLODipine  10 mg Oral Daily  . aspirin  81 mg Oral Daily  . atorvastatin  40 mg Oral QHS  . carvedilol  6.25 mg Oral BID WC  . heparin  5,000 Units Subcutaneous Q8H  . insulin aspart  0-5 Units Subcutaneous QHS  . insulin aspart  0-9 Units Subcutaneous TID WC  . insulin glargine  20 Units Subcutaneous QHS  . pantoprazole  40 mg Oral Daily  . polyethylene glycol  17 g Oral BID  . pregabalin  75 mg Oral BID  . sacubitril-valsartan  1 tablet Oral BID  . senna-docusate  1 tablet Oral BID  . sertraline  50 mg Oral Daily  . tamsulosin  0.8 mg Oral Daily   Continuous Infusions: . sodium chloride 75 mL/hr at 07/02/20 1508     LOS: 0 days        Hosie Poisson, MD [facility]   To contact the attending provider between 7A-7P or the covering provider during after hours 7P-7A, please log into the web site [email or web address] and access using universal [place] password for that web site. If you do not have the password, please call the hospital operator.  07/02/2020, 4:09 PM

## 2020-07-03 ENCOUNTER — Encounter (HOSPITAL_COMMUNITY): Payer: Self-pay | Admitting: Internal Medicine

## 2020-07-03 ENCOUNTER — Observation Stay (HOSPITAL_COMMUNITY): Payer: Medicare HMO

## 2020-07-03 DIAGNOSIS — N189 Chronic kidney disease, unspecified: Secondary | ICD-10-CM | POA: Diagnosis not present

## 2020-07-03 DIAGNOSIS — K5900 Constipation, unspecified: Secondary | ICD-10-CM

## 2020-07-03 DIAGNOSIS — E042 Nontoxic multinodular goiter: Secondary | ICD-10-CM | POA: Diagnosis present

## 2020-07-03 DIAGNOSIS — I25118 Atherosclerotic heart disease of native coronary artery with other forms of angina pectoris: Secondary | ICD-10-CM | POA: Diagnosis not present

## 2020-07-03 DIAGNOSIS — N179 Acute kidney failure, unspecified: Secondary | ICD-10-CM | POA: Diagnosis not present

## 2020-07-03 DIAGNOSIS — I69398 Other sequelae of cerebral infarction: Secondary | ICD-10-CM | POA: Diagnosis not present

## 2020-07-03 DIAGNOSIS — I313 Pericardial effusion (noninflammatory): Secondary | ICD-10-CM | POA: Diagnosis not present

## 2020-07-03 DIAGNOSIS — E871 Hypo-osmolality and hyponatremia: Secondary | ICD-10-CM | POA: Diagnosis not present

## 2020-07-03 DIAGNOSIS — Z823 Family history of stroke: Secondary | ICD-10-CM | POA: Diagnosis not present

## 2020-07-03 DIAGNOSIS — N182 Chronic kidney disease, stage 2 (mild): Secondary | ICD-10-CM | POA: Diagnosis not present

## 2020-07-03 DIAGNOSIS — Q438 Other specified congenital malformations of intestine: Secondary | ICD-10-CM | POA: Diagnosis not present

## 2020-07-03 DIAGNOSIS — F319 Bipolar disorder, unspecified: Secondary | ICD-10-CM | POA: Diagnosis present

## 2020-07-03 DIAGNOSIS — E1165 Type 2 diabetes mellitus with hyperglycemia: Secondary | ICD-10-CM | POA: Diagnosis not present

## 2020-07-03 DIAGNOSIS — I130 Hypertensive heart and chronic kidney disease with heart failure and stage 1 through stage 4 chronic kidney disease, or unspecified chronic kidney disease: Secondary | ICD-10-CM | POA: Diagnosis not present

## 2020-07-03 DIAGNOSIS — Z87891 Personal history of nicotine dependence: Secondary | ICD-10-CM | POA: Diagnosis not present

## 2020-07-03 DIAGNOSIS — I69351 Hemiplegia and hemiparesis following cerebral infarction affecting right dominant side: Secondary | ICD-10-CM | POA: Diagnosis not present

## 2020-07-03 DIAGNOSIS — D72818 Other decreased white blood cell count: Secondary | ICD-10-CM

## 2020-07-03 DIAGNOSIS — G459 Transient cerebral ischemic attack, unspecified: Secondary | ICD-10-CM | POA: Diagnosis present

## 2020-07-03 DIAGNOSIS — D72819 Decreased white blood cell count, unspecified: Secondary | ICD-10-CM | POA: Diagnosis not present

## 2020-07-03 DIAGNOSIS — I160 Hypertensive urgency: Secondary | ICD-10-CM | POA: Diagnosis not present

## 2020-07-03 DIAGNOSIS — I5043 Acute on chronic combined systolic (congestive) and diastolic (congestive) heart failure: Secondary | ICD-10-CM | POA: Diagnosis not present

## 2020-07-03 DIAGNOSIS — E7800 Pure hypercholesterolemia, unspecified: Secondary | ICD-10-CM | POA: Diagnosis not present

## 2020-07-03 DIAGNOSIS — M1990 Unspecified osteoarthritis, unspecified site: Secondary | ICD-10-CM | POA: Diagnosis present

## 2020-07-03 DIAGNOSIS — Z794 Long term (current) use of insulin: Secondary | ICD-10-CM | POA: Diagnosis not present

## 2020-07-03 DIAGNOSIS — Z809 Family history of malignant neoplasm, unspecified: Secondary | ICD-10-CM | POA: Diagnosis not present

## 2020-07-03 DIAGNOSIS — R072 Precordial pain: Secondary | ICD-10-CM | POA: Diagnosis not present

## 2020-07-03 DIAGNOSIS — R1032 Left lower quadrant pain: Secondary | ICD-10-CM | POA: Diagnosis not present

## 2020-07-03 DIAGNOSIS — R1013 Epigastric pain: Secondary | ICD-10-CM | POA: Diagnosis not present

## 2020-07-03 DIAGNOSIS — R0789 Other chest pain: Secondary | ICD-10-CM | POA: Diagnosis not present

## 2020-07-03 DIAGNOSIS — M170 Bilateral primary osteoarthritis of knee: Secondary | ICD-10-CM | POA: Diagnosis not present

## 2020-07-03 DIAGNOSIS — R471 Dysarthria and anarthria: Secondary | ICD-10-CM | POA: Diagnosis not present

## 2020-07-03 DIAGNOSIS — Z833 Family history of diabetes mellitus: Secondary | ICD-10-CM | POA: Diagnosis not present

## 2020-07-03 DIAGNOSIS — H538 Other visual disturbances: Secondary | ICD-10-CM | POA: Diagnosis not present

## 2020-07-03 DIAGNOSIS — Z8249 Family history of ischemic heart disease and other diseases of the circulatory system: Secondary | ICD-10-CM | POA: Diagnosis not present

## 2020-07-03 DIAGNOSIS — Z20822 Contact with and (suspected) exposure to covid-19: Secondary | ICD-10-CM | POA: Diagnosis not present

## 2020-07-03 DIAGNOSIS — E785 Hyperlipidemia, unspecified: Secondary | ICD-10-CM | POA: Diagnosis present

## 2020-07-03 DIAGNOSIS — E1122 Type 2 diabetes mellitus with diabetic chronic kidney disease: Secondary | ICD-10-CM | POA: Diagnosis not present

## 2020-07-03 DIAGNOSIS — I5032 Chronic diastolic (congestive) heart failure: Secondary | ICD-10-CM | POA: Diagnosis not present

## 2020-07-03 DIAGNOSIS — N184 Chronic kidney disease, stage 4 (severe): Secondary | ICD-10-CM | POA: Diagnosis not present

## 2020-07-03 DIAGNOSIS — R29898 Other symptoms and signs involving the musculoskeletal system: Secondary | ICD-10-CM | POA: Diagnosis not present

## 2020-07-03 DIAGNOSIS — F1410 Cocaine abuse, uncomplicated: Secondary | ICD-10-CM | POA: Diagnosis present

## 2020-07-03 DIAGNOSIS — I70213 Atherosclerosis of native arteries of extremities with intermittent claudication, bilateral legs: Secondary | ICD-10-CM | POA: Diagnosis not present

## 2020-07-03 DIAGNOSIS — R079 Chest pain, unspecified: Secondary | ICD-10-CM | POA: Diagnosis not present

## 2020-07-03 DIAGNOSIS — R531 Weakness: Secondary | ICD-10-CM | POA: Diagnosis not present

## 2020-07-03 DIAGNOSIS — R69 Illness, unspecified: Secondary | ICD-10-CM | POA: Diagnosis not present

## 2020-07-03 LAB — BASIC METABOLIC PANEL
Anion gap: 5 (ref 5–15)
BUN: 20 mg/dL (ref 6–20)
CO2: 22 mmol/L (ref 22–32)
Calcium: 8.1 mg/dL — ABNORMAL LOW (ref 8.9–10.3)
Chloride: 106 mmol/L (ref 98–111)
Creatinine, Ser: 1.31 mg/dL — ABNORMAL HIGH (ref 0.61–1.24)
GFR, Estimated: >60 mL/min (ref >60)
Glucose, Bld: 179 mg/dL — ABNORMAL HIGH (ref 70–99)
Potassium: 4.5 mmol/L (ref 3.5–5.1)
Sodium: 133 mmol/L — ABNORMAL LOW (ref 135–145)

## 2020-07-03 LAB — GLUCOSE, CAPILLARY
Glucose-Capillary: 266 mg/dL — ABNORMAL HIGH (ref 70–99)
Glucose-Capillary: 134 mg/dL — ABNORMAL HIGH (ref 70–99)
Glucose-Capillary: 182 mg/dL — ABNORMAL HIGH (ref 70–99)
Glucose-Capillary: 248 mg/dL — ABNORMAL HIGH (ref 70–99)

## 2020-07-03 LAB — PATHOLOGIST SMEAR REVIEW

## 2020-07-03 MED ORDER — CARVEDILOL 25 MG PO TABS
25.0000 mg | ORAL_TABLET | Freq: Two times a day (BID) | ORAL | Status: DC
  Filled 2020-07-03: qty 1

## 2020-07-03 MED ORDER — SACUBITRIL-VALSARTAN 49-51 MG PO TABS
1.0000 | ORAL_TABLET | Freq: Two times a day (BID) | ORAL | Status: DC
  Administered 2020-07-03 – 2020-07-05 (×4): 1 via ORAL
  Filled 2020-07-03 (×6): qty 1

## 2020-07-03 MED ORDER — CARVEDILOL 12.5 MG PO TABS
12.5000 mg | ORAL_TABLET | Freq: Two times a day (BID) | ORAL | Status: DC
  Administered 2020-07-03 – 2020-07-05 (×5): 12.5 mg via ORAL
  Filled 2020-07-03 (×4): qty 1

## 2020-07-03 MED ORDER — CARVEDILOL 12.5 MG PO TABS: 12.5000 mg | ORAL_TABLET | Freq: Two times a day (BID) | ORAL | Status: DC

## 2020-07-03 MED ORDER — ATORVASTATIN CALCIUM 80 MG PO TABS
80.0000 mg | ORAL_TABLET | Freq: Every day | ORAL | Status: DC
  Administered 2020-07-03 – 2020-07-04 (×2): 80 mg via ORAL
  Filled 2020-07-03 (×2): qty 1

## 2020-07-03 MED ORDER — POLYETHYLENE GLYCOL 3350 17 GM/SCOOP PO POWD
1.0000 | Freq: Once | ORAL | Status: AC
  Administered 2020-07-03: 255 g via ORAL
  Filled 2020-07-03: qty 255

## 2020-07-03 NOTE — Consult Note (Addendum)
Referring Provider:  [facility]         Primary Care Physician:  [name], NP Primary Gastroenterologist:   unassigned          We were asked to see this patient for:   Abdominal pain             ASSESSMENT / PLAN:    #60 yo male with chest pain and SOB. Per Cardiology's note he has undergone extensive evaluation including a recent nuclear stress test. Cardiology has low suspicion for ACS.   # Mild abdominal pain. Limited historian (by choice). He points to LLQ and says it hurts "only when people push on it'. CT scan in late January showed moderate stool burden in rectosigmoid colon. Pain may be secondary to constipation or musculoskeletal.  He is already taking Miralax BID, had one BM yesterday --Soft bowel purge today to see if LLQ discomfort improves.   # Dresser anemia, ferritin normal. TIBC low to normal, percent of iron saturation is low so not clear cut IDA.  --Anemia could be secondary to CKD. However, he does need colonoscopy at some point. Inadequate prep on the last one by [facility] in December. This can be done outpatient with a two day prep.     Attending Physician Note   I have taken a history, examined the patient and reviewed the chart. I agree with the Advanced Practitioner's note, impression and recommendations.  * Mild LLQ abdominal pain. Suspected constipation, incomplete fecal evacuation. CT AP in January showed increased rectosigmoid stool and a mild colonic ileus. Possible musculoskeletal pain. * Normocytic anemia, not iron deficient.  * Leukopenia * Cholelithiasis, asymptomatic   Mild bowel purge Continue Miralax qd to bid as inpatient and outpatient Elective, outpatient colonoscopy in next few months is reasonable  Consider outpatient hematology consult GI signing off, available if needed   Lucio Edward, MD [facility] [phone]      HPI:                                                                                                                              Chief Complaint:   [name] is a 60 y.o. male with a past medical history of bipolar disorder, diabetes, CVA, CKD, substance abuse in 2019, CAD, CHF, PAH, chronic leukopenia, cholelithiasis.   Patient seen in hospital by [facility] in late November 2021 for segmental thickening of distal transverse colon, ? underdistention and small bowel air / fluid levels on imaging.  Felt to have ileus associated with profound electrolyte imbalances. He failed to improve with expected management and subsequently underwent colonoscopy. The no evidence for stricture of obstruction but otherwise visualization was obscured by solid stool throughout. The ileus was slow to resolve. He regarding NGT placement  Patient admitted with chest pain, hypertensive urgency and SOB. He has CAD with subtotal occlusion of OM1 by cath at [facility] last year. Evaluated yesterday by Cardiology. He  has had an extensive Cardiac workup including a recent admission when he had a nuclear stress test. Suspicion for ACS is low.   We were called to see the patient for abdominal pain. I woke him up from sleep and he was irritated. He points to LLQ as area where he hurts but "only when people push on it'.    PREVIOUS ENDOSCOPIC EVALUATIONS / PERTINENT STUDIES   Nov 2021 Colonoscopy for abnormal imaging of GI tract --difficult exam, redundant colon. Preparation of the colon was inadequate. - Dilated in the entire examined colon. - Stool in the entire examined colon. Fluid aspiration performed.  Dec 2021 Colonoscopy by [facility]  --Stool in entire colon     Past Medical History:  Diagnosis Date  . Arthritis   . Bipolar affective ([place])   . CHF (congestive heart failure) ([place])   . Depression   . Diabetes mellitus    uncontrolled   . Hypertension     Past Surgical History:  Procedure Laterality Date  . BOWEL DECOMPRESSION N/A 01/19/2020   Procedure: BOWEL DECOMPRESSION;  Surgeon: Otis Brace, MD;   Location: [place];  Service: Gastroenterology;  Laterality: N/A;  . COLONOSCOPY WITH PROPOFOL N/A 01/19/2020   Procedure: COLONOSCOPY WITH PROPOFOL;  Surgeon: Otis Brace, MD;  Location: [place];  Service: Gastroenterology;  Laterality: N/A;  . KNEE SURGERY     b/l knees; pending total knee     Prior to Admission medications   Medication Sig Start Date End Date Taking? Authorizing Provider  amLODipine (NORVASC) 10 MG tablet Take 10 mg by mouth daily.   Yes [provider]  ammonium lactate (LAC-HYDRIN) 12 % lotion Apply 1 application topically 2 (two) times daily. Both legs 05/19/20  Yes [provider]  atorvastatin (LIPITOR) 20 MG tablet Take 1 tablet (20 mg total) by mouth daily. 06/28/20  Yes Hosie Poisson, MD  feeding supplement (ENSURE ENLIVE / ENSURE PLUS) LIQD Take 237 mLs by mouth 2 (two) times daily between meals. 06/28/20 09/26/20 Yes Hosie Poisson, MD  furosemide (LASIX) 80 MG tablet Take 0.5 tablets (40 mg total) by mouth daily. 06/28/20 09/26/20 Yes Hosie Poisson, MD  insulin aspart (NOVOLOG) 100 UNIT/ML injection Inject 12 Units into the skin 3 (three) times daily before meals. For blood sugars 0-150 give 0 units of insulin, 151-200 give 2 units of insulin, 201-250 give 4 units, 251-300 give 6 units, 301-350 give 8 units, 351-400 give 10 units,> 400 give 12 units and call M.D. Patient taking differently: Inject 2-12 Units into the skin See admin instructions. Check and record blood sugar before meals and inject per ssi. 151-200 = 2 units  201-250 = 4 units  251-300 = 6 units  301-400 = 10 units  greater than 400 = 12 units and call MD. 03/30/20  Yes [name], NP  Insulin Glargine (BASAGLAR KWIKPEN) 100 UNIT/ML Inject 20 Units into the skin at bedtime.   Yes [provider]  polyethylene glycol (MIRALAX / GLYCOLAX) 17 g packet Take 17 g by mouth daily as needed. Patient taking differently: Take 17 g by mouth daily as needed for mild  constipation. 03/30/20  Yes [name], NP  pregabalin (LYRICA) 75 MG capsule Take 75 mg by mouth 2 (two) times daily. 05/19/20  Yes [provider]  sacubitril-valsartan (ENTRESTO) 49-51 MG Take 1 tablet by mouth 2 (two) times daily. 06/28/20  Yes Hosie Poisson, MD  sertraline (ZOLOFT) 50 MG tablet Take 50 mg by mouth daily. 05/29/20  Yes [provider]  tamsulosin (FLOMAX) 0.4 MG CAPS capsule Take 1 capsule (0.4 mg total) by mouth daily. 03/30/20  Yes [name], NP  amLODipine (NORVASC) 10 MG tablet Take 1 tablet (10 mg total) by mouth daily. Patient not taking: Reported on 06/30/2020 03/30/20 04/29/20  [name], NP  insulin glargine (LANTUS SOLOSTAR) 100 UNIT/ML Solostar Pen Inject 20 Units into the skin at bedtime. Patient not taking: Reported on 06/30/2020 03/30/20   [name], NP    Current Facility-Administered Medications  Medication Dose Route Frequency Provider Last Rate Last Admin  . acetaminophen (TYLENOL) tablet 650 mg  650 mg Oral Q4H PRN Eulogio Bear U, DO   650 mg at 06/30/20 1851  . amLODipine (NORVASC) tablet 10 mg  10 mg Oral Daily Eulogio Bear U, DO   10 mg at 07/03/20 0813  . aspirin chewable tablet 81 mg  81 mg Oral Daily Tolia, Sunit, DO   81 mg at 07/03/20 0813  . atorvastatin (LIPITOR) tablet 80 mg  80 mg Oral QHS Hosie Poisson, MD      . carvedilol (COREG) tablet 6.25 mg  6.25 mg Oral BID WC Tolia, Sunit, DO   6.25 mg at 07/03/20 0738  . heparin injection 5,000 Units  5,000 Units Subcutaneous Q8H Vann, Jessica U, DO   5,000 Units at 07/03/20 0548  . hydrALAZINE (APRESOLINE) tablet 25 mg  25 mg Oral Q8H PRN Hosie Poisson, MD   25 mg at 07/01/20 0928  . insulin aspart (novoLOG) injection 0-5 Units  0-5 Units Subcutaneous QHS Hosie Poisson, MD   2 Units at 07/01/20 2241  . insulin aspart (novoLOG) injection 0-9 Units  0-9 Units Subcutaneous TID WC Hosie Poisson, MD   5 Units at 07/03/20 0820  . insulin glargine (LANTUS) injection  25 Units  25 Units Subcutaneous QHS Hosie Poisson, MD   25 Units at 07/02/20 2236  . nitroGLYCERIN (NITROSTAT) SL tablet 0.4 mg  0.4 mg Sublingual Q5 min PRN Hosie Poisson, MD   0.4 mg at 07/02/20 1030  . ondansetron (ZOFRAN) injection 4 mg  4 mg Intravenous Q6H PRN Eulogio Bear U, DO   4 mg at 07/01/20 0503  . pantoprazole (PROTONIX) EC tablet 40 mg  40 mg Oral Daily Eulogio Bear U, DO   40 mg at 07/03/20 0813  . polyethylene glycol (MIRALAX / GLYCOLAX) packet 17 g  17 g Oral BID Vann, Jessica U, DO      . pregabalin (LYRICA) capsule 75 mg  75 mg Oral BID Eulogio Bear U, DO   75 mg at 07/03/20 0813  . sacubitril-valsartan (ENTRESTO) 49-51 mg per tablet  1 tablet Oral BID Eulogio Bear U, DO   1 tablet at 07/02/20 2144  . senna-docusate (Senokot-S) tablet 1 tablet  1 tablet Oral BID Eulogio Bear U, DO   1 tablet at 07/03/20 0813  . sertraline (ZOLOFT) tablet 50 mg  50 mg Oral Daily Eulogio Bear U, DO      . tamsulosin (FLOMAX) capsule 0.8 mg  0.8 mg Oral Daily Eulogio Bear U, DO   0.8 mg at 07/03/20 0813    Allergies as of 06/30/2020 - Review Complete 06/30/2020  Allergen Reaction Noted  . Other Anaphylaxis, Itching, and Swelling 08/28/2013    Family History  Problem Relation Age of Onset  . Diabetes Other   . Cancer Mother        uterus; deceased   . Heart attack Mother 1  . Hypertension Other   .  Heart attack Father 48  . Stroke Sister        12/2012 age 77 y.o    Social History   Socioeconomic History  . Marital status: Single    Spouse name: Not on file  . Number of children: Not on file  . Years of education: Not on file  . Highest education level: Not on file  Occupational History  . Not on file  Tobacco Use  . Smoking status: Former Research scientist (life sciences)  . Smokeless tobacco: Never Used  Vaping Use  . Vaping Use: Never used  Substance and Sexual Activity  . Alcohol use: No  . Drug use: Yes    Types: Cocaine  . Sexual activity: Yes    Birth control/protection: Condom   Other Topics Concern  . 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   OBJECTIVE:    Physical Exam: Vital signs in last 24 hours: Temp:  [97.6 F (36.4 C)-97.8 F (36.6 C)] 97.8 F (36.6 C) (05/09 0721) Pulse Rate:  [70-81] 81 (05/09 0738) Resp:  [13-18] 18 (05/09 0721) BP: (129-172)/(85-116) 142/95 (05/09 0813) SpO2:  [97 %-100 %] 99 % (05/09 0539) Last BM Date: 07/02/20 General:   Alert  male in NAD Psych: Irritated. . Eyes:  Pupils equal, sclera clear, no icterus.   Conjunctiva pink. Ears:  Normal auditory acuity. Nose:  No deformity, discharge,  or lesions. Neck:  Supple; no masses Lungs:  Clear throughout to auscultation.   No wheezes, crackles, or rhonchi.  Heart:  Regular rate and rhythm, no lower extremity edema Abdomen:  Soft, non-distended, mild LLQ tenderness. BS active, no palp mass   Rectal:  Deferred  Msk:  Symmetrical without gross deformities. . Neurologic:  Alert and  oriented x4;  grossly normal neurologically. Skin:  Intact without significant lesions or rashes.  Filed Weights   06/30/20 0031  Weight: 115 kg     Scheduled inpatient medications . amLODipine  10 mg Oral Daily  . aspirin  81 mg Oral Daily  . atorvastatin  80 mg Oral QHS  . carvedilol  6.25 mg Oral BID WC  . heparin  5,000 Units Subcutaneous Q8H  . insulin aspart  0-5 Units Subcutaneous QHS  .  insulin aspart  0-9 Units Subcutaneous TID WC  . insulin glargine  25 Units Subcutaneous QHS  . pantoprazole  40 mg Oral Daily  . polyethylene glycol  17 g Oral BID  . pregabalin  75 mg Oral BID  . sacubitril-valsartan  1 tablet Oral BID  . senna-docusate  1 tablet Oral BID  . sertraline  50 mg Oral Daily  . tamsulosin  0.8 mg Oral Daily      Intake/Output from previous day: 05/08 0701 - 05/09 0700 In: 1674.4 [  P.O.:350; I.V.:1324.4] Out: 1191 [Urine:1675] Intake/Output this shift: No intake/output data recorded.   Lab Results: Recent Labs    07/01/20 0245 07/02/20 0126  WBC 3.3* 2.8*  HGB 11.1* 10.0*  HCT 35.5* 32.6*  PLT 227 230   BMET Recent Labs    07/01/20 0245 07/02/20 0126 07/03/20 0950  NA 133* 132* 133*  K 4.0 4.3 4.5  CL 102 103 106  CO2 24 25 22   GLUCOSE 202* 228* 179*  BUN 17 20 20   CREATININE 1.69* 1.61* 1.31*  CALCIUM 8.2* 8.0* 8.1*   LFT No results for input(s): PROT, ALBUMIN, AST, ALT, ALKPHOS, BILITOT, BILIDIR, IBILI in the last 72 hours. PT/INR No results for input(s): LABPROT, INR in the last 72 hours. Hepatitis Panel No results for input(s): HEPBSAG, HCVAB, HEPAIGM, HEPBIGM in the last 72 hours.   . CBC Latest Ref Rng & Units 07/02/2020 07/01/2020 06/30/2020  WBC 4.0 - 10.5 K/uL 2.8(L) 3.3(L) 3.5(L)  Hemoglobin 13.0 - 17.0 g/dL 10.0(L) 11.1(L) 11.5(L)  Hematocrit 39.0 - 52.0 % 32.6(L) 35.5(L) 37.4(L)  Platelets 150 - 400 K/uL 230 227 260    . CMP Latest Ref Rng & Units 07/03/2020 07/02/2020 07/01/2020  Glucose 70 - 99 mg/dL 179(H) 228(H) 202(H)  BUN 6 - 20 mg/dL 20 20 17   Creatinine 0.61 - 1.24 mg/dL 1.31(H) 1.61(H) 1.69(H)  Sodium 135 - 145 mmol/L 133(L) 132(L) 133(L)  Potassium 3.5 - 5.1 mmol/L 4.5 4.3 4.0  Chloride 98 - 111 mmol/L 106 103 102  CO2 22 - 32 mmol/L 22 25 24   Calcium 8.9 - 10.3 mg/dL 8.1(L) 8.0(L) 8.2(L)  Total Protein 6.5 - 8.1 g/dL - - -  Total Bilirubin 0.3 - 1.2 mg/dL - - -  Alkaline Phos 38 - 126 U/L - - -  AST 15  - 41 U/L - - -  ALT 0 - 44 U/L - - -   Studies/Results: CT HEAD CODE STROKE WO CONTRAST  Result Date: 07/02/2020 CLINICAL DATA:  Code stroke. 60 year old male with vision changes and left side tingling. EXAM: CT HEAD WITHOUT CONTRAST TECHNIQUE: Contiguous axial images were obtained from the base of the skull through the vertex without intravenous contrast. COMPARISON:  Head CT 06/25/2020. FINDINGS: Brain: Asymmetric hypodensity in the left caudate appears stable from the recent CT, but there is also new or definitely increased hypodensity in the left thalamus also on series 3, image 16 today. Compare also coronal image 34 today to image 33 previously. Gray-white matter differentiation is stable elsewhere, with probable perivascular space of the right lentiform on image 14. No midline shift, ventriculomegaly, mass effect, evidence of mass lesion, intracranial hemorrhage or evidence of cortically based acute infarction. Vascular: Calcified atherosclerosis at the skull base. No suspicious intracranial vascular hyperdensity. No suspicious intracranial vascular hyperdensity. Skull: Negative. Sinuses/Orbits: Visualized paranasal sinuses and mastoids are stable and well aerated. Other: Right vertex scalp hematoma or edema has resolved. Visualized orbits and scalp soft tissues are within normal limits. ASPECTS [facility] [facility] Stroke Program Early CT Score) Total score (0-10 with 10 being normal): 10 IMPRESSION: 1. Evidence of small vessel ischemia in the left caudate and the left thalamus, with the latter more apparent than on a Head CT 1 week ago. 2. No acute cortically based infarct or acute intracranial hemorrhage identified. ASPECTS 10. 3. These results were communicated to Dr. Cheral Marker at [facility] 5/8/2022by text page via the [facility] messaging system. Electronically Signed   By: Genevie Ann M.D.   On: 07/02/2020 11:15   VAS  US CAROTID  Result Date: 07/03/2020 Carotid Arterial Duplex Study Patient Name:  [name]  Date  of Exam:   07/03/2020 Medical Rec #: [id number]      Accession #:    [id number] Date of Birth: 06/19/1960       Patient Gender: M Patient Age:   55Y Exam Location[facility]  [facility] Procedure:      VAS US CAROTID Referring Phys: [id number] Hosie Poisson --------------------------------------------------------------------------------  Indications:       TIA and Blurry vision of the left eye. Risk Factors:      Hypertension, Diabetes, prior CVA. Other Factors:     History of Covid 19 03/08/20, CHF, remote history of                    polysubstance abuse. Comparison Study:  No prior study on file Performing Technologist: Sharion Dove RVS  Examination Guidelines: A complete evaluation includes B-mode imaging, spectral Doppler, color Doppler, and power Doppler as needed of all accessible portions of each vessel. Bilateral testing is considered an integral part of a complete examination. Limited examinations for reoccurring indications may be performed as noted.  Right Carotid Findings: +----------+--------+--------+--------+------------------+------------------+           PSV cm/sEDV cm/sStenosisPlaque DescriptionComments           +----------+--------+--------+--------+------------------+------------------+ CCA Prox  75      19                                intimal thickening +----------+--------+--------+--------+------------------+------------------+ CCA Distal63      19                                intimal thickening +----------+--------+--------+--------+------------------+------------------+ ICA Prox  61      23              heterogenous                         +----------+--------+--------+--------+------------------+------------------+ ICA Distal72      22                                                   +----------+--------+--------+--------+------------------+------------------+ ECA       98      19                                                    +----------+--------+--------+--------+------------------+------------------+ +----------+--------+-------+--------+-------------------+           PSV cm/sEDV cmsDescribeArm Pressure (mmHG) +----------+--------+-------+--------+-------------------+ [id number]                                         +----------+--------+-------+--------+-------------------+ +---------+--------+--+--------+--+ VertebralPSV cm/s60EDV cm/s18 +---------+--------+--+--------+--+  Left Carotid Findings: +----------+--------+--------+--------+------------------+------------------+           PSV cm/sEDV cm/sStenosisPlaque DescriptionComments           +----------+--------+--------+--------+------------------+------------------+ CCA Prox  73      17  intimal thickening +----------+--------+--------+--------+------------------+------------------+ CCA Distal66      19                                intimal thickening +----------+--------+--------+--------+------------------+------------------+ ICA Prox  68      24              heterogenous                         +----------+--------+--------+--------+------------------+------------------+ ICA Distal58      22                                                   +----------+--------+--------+--------+------------------+------------------+ ECA       92      12                                                   +----------+--------+--------+--------+------------------+------------------+ +----------+--------+--------+--------+-------------------+           PSV cm/sEDV cm/sDescribeArm Pressure (mmHG) +----------+--------+--------+--------+-------------------+ [id number]                                          +----------+--------+--------+--------+-------------------+ +---------+--------+--+--------+-+ VertebralPSV cm/s40EDV cm/s9 +---------+--------+--+--------+-+   Summary: Right Carotid:  The extracranial vessels were near-normal with only minimal wall                thickening or plaque. Left Carotid: The extracranial vessels were near-normal with only minimal wall               thickening or plaque. Vertebrals:  Bilateral vertebral arteries demonstrate antegrade flow. Subclavians: Normal flow hemodynamics were seen in bilateral subclavian              arteries. *See table(s) above for measurements and observations.     Preliminary     Active Problems:   Leukopenia   Diabetes mellitus type 2, uncontrolled ([place])   Hypertensive urgency   Chest pain   CKD (chronic kidney disease)    Tye Savoy, NP-C @  07/03/2020, 11:48 AM

## 2020-07-03 NOTE — Progress Notes (Signed)
PROGRESS NOTE    [name]  DJM:[id number] DOB: 11-04-1960 DOA: 06/30/2020 PCP: [name], NP    Chief Complaint  Patient presents with  . Chest Pain  . Shortness of Breath    Brief Narrative:  60 year old male with history of hypertension, CHF, diabetes mellitus type 2, CVA with residual right-sided weakness, depression, remote polysubstance abuse presents with complaints of intermittent chest pressure. Pt was recently admitted for the same reasons between 06/25/20 and 06/28/20, underwent non invasive test, was negative for acute ischemia and discharged.  But presents for recurrent symptoms. Cardiology consulted and he was initially scheduled for cardiac cath on Monday. On 07/02/20 patient had acute left sided blurry vision,  A code stroke activated for further evaluation. Initial CT head without contrast showed Evidence of small vessel ischemia in the left caudate and the left thalamus, with the latter more apparent than on a Head CT 1 week ago. Follow up MRI brain without contrast ordered for further evaluation. Pt couldn't tolerate the procedure and wanted to get it done under general anesthesia. Pt seen and examined at bedside. Pt denies any chest pain or sob.   Assessment & Plan:   Active Problems:   Leukopenia   Diabetes mellitus type 2, uncontrolled (HCC)   Hypertensive urgency   Chest pain   CKD (chronic kidney disease)      Atypical Chest pain:  patient has mild elevation of troponin on admission, EKG showed nonspecific T wave abnormality in lateral leads.  CXR does not show any acute pulm disease.  Echocardiogram shows grade 1 diastolic dysfunction. Moderately elevated pulmonary artery systolic pressure. Cardiology was consulted,  Underwent nuclear stress test on last admission showing  No reversible ischemia. Probable inferior wall diaphragmatic attenuation. Pt presents 2 days later with similar symptoms, cardiology consulted , initially recommended cardiac  cath, but in view of his new deficits and code stroke, and asymptomatic from cardiovascular standpoint  he was taken off schedule for cath tomorrow.  Requested GI  for further work up of his atypical chest pain / epigastric pain. Meanwhile continue with aspirin , lipitor, coreg and entresto.     Hypertensive Urgency:  Permissive hypertension for the TIA  Pulmonary artery hypertension- echocardiogram shows moderately reduced pulmonary artery pressure. Patient currently not requiring oxygen. Recommend outpatient follow up with pulmonology for surveillance.     Diabetes mellitus type 2, insulin dependent with stage 2 CKD. Uncontrolled with hyperglycemia.  -hemoglobin A1c is 9.1.  Start the patient on lantus and SSI.  CBG (last 3)  Recent Labs    07/02/20 1633 07/02/20 2136 07/03/20 0743  GLUCAP 195* 174* 266*   Increase lantus to 25 units tonight.    Leukopenia- improving. Unclear etiology.   Iron deficiency anemia- patient's hemoglobin is 11, serum iron studies from November 2021 showed serum iron of 33 with saturation 13%. He had 2 attempts of colonoscopy November 2021 and December 2021 but patient had inadequate prep. Follow-up as outpatient.    Hypoalbuminemia-unclear etiology,  Will need dietary consult and outpatient follow up.   CKD stage II- Creatinine around 1.6 today improved from 1.8,.    History of CVA with residual deficit- patient has known history of CVA in September 2021 which left him with residual right-sided weakness and mostly wheelchair-bound.   Thyroid nodule- CTA chest shows bilateral thyroid nodules measuring 2.6 cm on the right. TSH 1.714. Will need thyroid ultrasound as outpatient. Follow-up PCP for further work-up.  Mild hyponatremia:  Continue to monitor.  Left visual field defect/ blurry vision:  Resolved as per the patient. Code stroke called yesterday and CT head showed  Evidence of small vessel ischemia in the left caudate and  the left thalamus, with the latter more apparent than on a Head CT 1 week ago. Follow up MRI brain without contrast ordered for further evaluation. Pt couldn't tolerate the procedure and wanted to get it done under general anesthesia. Neurology on board.  Carotid duplex pending.  LDL in 224, increased her lipitor from 40 mg to 80 mg daily.      DVT prophylaxis: Heparin.  Code Status: full code.  Family Communication: None at bedside.  Disposition:   Status is: Observation  The patient will require care spanning > 2 midnights and should be moved to inpatient because: Ongoing diagnostic testing needed not appropriate for outpatient work up  Dispo: The patient is from: Home              Anticipated d/c is to: ALF              Patient currently is not medically stable to d/c.   Difficult to place patient No       Consultants:   Cardiology.    Procedures: cardiac cath scheduled on Monday.    Antimicrobials: none.    Subjective: No chest pain today,   Objective: Vitals:   07/03/20 0539 07/03/20 0721 07/03/20 0738 07/03/20 0813  BP: 129/85 (!) 142/95  (!) 142/95  Pulse: 76 79 81   Resp: 14     Temp: 97.8 F (36.6 C) 97.8 F (36.6 C)    TempSrc: Oral Oral    SpO2: 99%     Weight:      Height:        Intake/Output Summary (Last 24 hours) at 07/03/2020 0923 Last data filed at 07/03/2020 0655 Gross per 24 hour  Intake 1324.42 ml  Output 1675 ml  Net -350.58 ml   Filed Weights   06/30/20 0031  Weight: 115 kg    Examination:  General exam: well developed gentleman, no distress noted.  Respiratory system : Air entry fair no wheezing or rhonchi Cardiovascular system: S1-S2 heard, regular rate rhythm, no JVD, pedal edema present Gastrointestinal system: Abdomen is soft nontender nondistended bowel sounds normal Central nervous system: Alert and oriented, left residual weakness Extremities: Pedal edema present  skin: No  Rashes.  Psychiatry: Mood is  appropriate    Data Reviewed: I have personally reviewed following labs and imaging studies  CBC: Recent Labs  Lab 06/27/20 0301 06/30/20 0040 07/01/20 0245 07/02/20 0126  WBC 2.9* 3.5* 3.3* 2.8*  NEUTROABS  --   --   --  0.8*  HGB 10.1* 11.5* 11.1* 10.0*  HCT 32.0* 37.4* 35.5* 32.6*  MCV 78.4* 81.0 80.3 79.9*  PLT 211 260 227 035    Basic Metabolic Panel: Recent Labs  Lab 06/28/20 0309 06/30/20 0039 07/01/20 0245 07/02/20 0126  NA 133* 135 133* 132*  K 4.0 3.9 4.0 4.3  CL 103 106 102 103  CO2 27 25 24 25   GLUCOSE 214* 170* 202* 228*  BUN 19 17 17 20   CREATININE 1.88* 1.52* 1.69* 1.61*  CALCIUM 7.9* 8.4* 8.2* 8.0*    GFR: Estimated Creatinine Clearance: 61.5 mL/min (A) (by C-G formula based on SCr of 1.61 mg/dL (H)).  Liver Function Tests: Recent Labs  Lab 06/27/20 0301 06/30/20 0039  AST 13* 15  ALT 11 10  ALKPHOS 81 76  BILITOT <0.1* 0.5  PROT 5.3* 5.7*  ALBUMIN 1.6* 1.8*    CBG: Recent Labs  Lab 07/02/20 0742 07/02/20 1152 07/02/20 1633 07/02/20 2136 07/03/20 0743  GLUCAP 206* 239* 195* 174* 266*     Recent Results (from the past 240 hour(s))  Resp Panel by RT-PCR (Flu A&B, Covid) Nasopharyngeal Swab     Status: None   Collection Time: 06/25/20  8:32 AM   Specimen: Nasopharyngeal Swab; Nasopharyngeal(NP) swabs in vial transport medium  Result Value Ref Range Status   SARS Coronavirus 2 by RT PCR NEGATIVE NEGATIVE Final    Comment: (NOTE) SARS-CoV-2 target nucleic acids are NOT DETECTED.  The SARS-CoV-2 RNA is generally detectable in upper respiratory specimens during the acute phase of infection. The lowest concentration of SARS-CoV-2 viral copies this assay can detect is 138 copies/mL. A negative result does not preclude SARS-Cov-2 infection and should not be used as the sole basis for treatment or other patient management decisions. A negative result may occur with  improper specimen collection/handling, submission of specimen  other than nasopharyngeal swab, presence of viral mutation(s) within the areas targeted by this assay, and inadequate number of viral copies(<138 copies/mL). A negative result must be combined with clinical observations, patient history, and epidemiological information. The expected result is Negative.  Fact Sheet for Patients:  EntrepreneurPulse.com.au  Fact Sheet for Healthcare Providers:  IncredibleEmployment.be  This test is no t yet approved or cleared by the [place] FDA and  has been authorized for detection and/or diagnosis of SARS-CoV-2 by FDA under an Emergency Use Authorization (EUA). This EUA will remain  in effect (meaning this test can be used) for the duration of the COVID-19 declaration under Section 564(b)(1) of the Act, 21 U.S.C.section 360bbb-3(b)(1), unless the authorization is terminated  or revoked sooner.       Influenza A by PCR NEGATIVE NEGATIVE Final   Influenza B by PCR NEGATIVE NEGATIVE Final    Comment: (NOTE) The Xpert Xpress SARS-CoV-2/FLU/RSV plus assay is intended as an aid in the diagnosis of influenza from Nasopharyngeal swab specimens and should not be used as a sole basis for treatment. Nasal washings and aspirates are unacceptable for Xpert Xpress SARS-CoV-2/FLU/RSV testing.  Fact Sheet for Patients: EntrepreneurPulse.com.au  Fact Sheet for Healthcare Providers: IncredibleEmployment.be  This test is not yet approved or cleared by the [place] FDA and has been authorized for detection and/or diagnosis of SARS-CoV-2 by FDA under an Emergency Use Authorization (EUA). This EUA will remain in effect (meaning this test can be used) for the duration of the COVID-19 declaration under Section 564(b)(1) of the Act, 21 U.S.C. section 360bbb-3(b)(1), unless the authorization is terminated or revoked.  Performed at [facility], [place] [address], [place],  [place] [address]   SARS CORONAVIRUS 2 (TAT 6-24 HRS) Nasopharyngeal Nasopharyngeal Swab     Status: None   Collection Time: 06/30/20 12:47 PM   Specimen: Nasopharyngeal Swab  Result Value Ref Range Status   SARS Coronavirus 2 NEGATIVE NEGATIVE Final    Comment: (NOTE) SARS-CoV-2 target nucleic acids are NOT DETECTED.  The SARS-CoV-2 RNA is generally detectable in upper and lower respiratory specimens during the acute phase of infection. Negative results do not preclude SARS-CoV-2 infection, do not rule out co-infections with other pathogens, and should not be used as the sole basis for treatment or other patient management decisions. Negative results must be combined with clinical observations, patient history, and epidemiological information. The expected result is Negative.  Fact Sheet for Patients: SugarRoll.be  Fact  Sheet for Healthcare Providers: https://www.woods-mathews.com/  This test is not yet approved or cleared by the [place] FDA and  has been authorized for detection and/or diagnosis of SARS-CoV-2 by FDA under an Emergency Use Authorization (EUA). This EUA will remain  in effect (meaning this test can be used) for the duration of the COVID-19 declaration under Se ction 564(b)(1) of the Act, 21 U.S.C. section 360bbb-3(b)(1), unless the authorization is terminated or revoked sooner.  Performed at [facility], [place] [address], [place], [place] [address]          Radiology Studies: CT HEAD CODE STROKE WO CONTRAST  Result Date: 07/02/2020 CLINICAL DATA:  Code stroke. 60 year old male with vision changes and left side tingling. EXAM: CT HEAD WITHOUT CONTRAST TECHNIQUE: Contiguous axial images were obtained from the base of the skull through the vertex without intravenous contrast. COMPARISON:  Head CT 06/25/2020. FINDINGS: Brain: Asymmetric hypodensity in the left caudate appears stable from the recent CT, but there is  also new or definitely increased hypodensity in the left thalamus also on series 3, image 16 today. Compare also coronal image 34 today to image 33 previously. Gray-white matter differentiation is stable elsewhere, with probable perivascular space of the right lentiform on image 14. No midline shift, ventriculomegaly, mass effect, evidence of mass lesion, intracranial hemorrhage or evidence of cortically based acute infarction. Vascular: Calcified atherosclerosis at the skull base. No suspicious intracranial vascular hyperdensity. No suspicious intracranial vascular hyperdensity. Skull: Negative. Sinuses/Orbits: Visualized paranasal sinuses and mastoids are stable and well aerated. Other: Right vertex scalp hematoma or edema has resolved. Visualized orbits and scalp soft tissues are within normal limits. ASPECTS [facility] Stroke Program Early CT Score) Total score (0-10 with 10 being normal): 10 IMPRESSION: 1. Evidence of small vessel ischemia in the left caudate and the left thalamus, with the latter more apparent than on a Head CT 1 week ago. 2. No acute cortically based infarct or acute intracranial hemorrhage identified. ASPECTS 10. 3. These results were communicated to Dr. Cheral Marker at [place] 5/8/2022by text page via the [facility] messaging system. Electronically Signed   By: Genevie Ann M.D.   On: 07/02/2020 11:15        Scheduled Meds: . amLODipine  10 mg Oral Daily  . aspirin  81 mg Oral Daily  . atorvastatin  80 mg Oral QHS  . carvedilol  6.25 mg Oral BID WC  . heparin  5,000 Units Subcutaneous Q8H  . insulin aspart  0-5 Units Subcutaneous QHS  . insulin aspart  0-9 Units Subcutaneous TID WC  . insulin glargine  25 Units Subcutaneous QHS  . pantoprazole  40 mg Oral Daily  . polyethylene glycol  17 g Oral BID  . pregabalin  75 mg Oral BID  . sacubitril-valsartan  1 tablet Oral BID  . senna-docusate  1 tablet Oral BID  . sertraline  50 mg Oral Daily  . tamsulosin  0.8 mg Oral Daily    Continuous Infusions:    LOS: 0 days        Hosie Poisson, MD [facility]   To contact the attending provider between 7A-7P or the covering provider during after hours 7P-7A, please log into the web site [email or web address] and access using universal [place] password for that web site. If you do not have the password, please call the hospital operator.  07/03/2020, 9:23 AM

## 2020-07-03 NOTE — TOC Progression Note (Addendum)
Transition of Care [facility]) - Progression Note    Patient Details  Name: [name] MRN: [id number] Date of Birth: 03/11/1960  [facility] CM/SW Contact  Zenon Mayo, RN Phone Number: 07/03/2020, 1:12 PM  Clinical Narrative:    Per CSW patient will need a rolling walker, NCM made referral to [facility] with [employer] for the rolling walker , this will be brought to his room.  Per Adela Lank with [employer] the patient just got a walker last year so not able to receive another one unless will private pay 40.00 for it.  CSW will inform patient. NCM contacted Shelia with [employer], informed her that they may have given patient's father a walker who has the same name, she looked into and said they will be bringing a walker up for patient.    Expected Discharge Plan: Assisted Living (from [facility]) Barriers to Discharge: Continued Medical Work up  Expected Discharge Plan and Services Expected Discharge Plan: Assisted Living (from [facility]) In-house Referral: Clinical Social Work     Living arrangements for the past 2 months: [place] ([place])                                       Social Determinants of Health (SDOH) Interventions    Readmission Risk Interventions Readmission Risk Prevention Plan 01/12/2020  Transportation Screening Complete  Palliative Care Screening Not Applicable  Some recent data might be hidden

## 2020-07-03 NOTE — Progress Notes (Signed)
Spoke with cardiology to receive clarification on the new order for carvedilol 25 mg. Advised that [name] was held in the AM due blood pressure reading. Attending and cardiology spoke and parameters have been added to heart medications.

## 2020-07-03 NOTE — Progress Notes (Signed)
Subjective:  C/O dyspnea. C/O abdominal pain on and off. Feels tired. No chest pain  Intake/Output from previous day:  I/O last 3 completed shifts: In: 2087.1 [P.O.:350; I.V.:1737.1] Out: 3025 [Urine:3025] Total I/O In: 480 [P.O.:480] Out: 400 [Urine:400]  Blood pressure (!) 142/95, pulse 81, temperature 97.8 F (36.6 C), temperature source Oral, resp. rate 18, height 5' 8"  (1.727 m), weight 115 kg, SpO2 99 %. Body mass index is 38.55 kg/m.   Vitals with BMI 07/03/2020 07/03/2020 07/03/2020  Height - - -  Weight - - -  BMI - - -  Systolic 005 - 110  Diastolic 95 - 95  Pulse - 81 79  Some encounter information is confidential and restricted. Go to Review Flowsheets activity to see all data.    Physical Exam Constitutional:      Appearance: He is obese. He is ill-appearing.  HENT:     Mouth/Throat:     Mouth: Mucous membranes are moist.  Eyes:     Extraocular Movements: Extraocular movements intact.  Neck:     Vascular: No carotid bruit or JVD.  Cardiovascular:     Rate and Rhythm: Normal rate and regular rhythm.     Pulses: Intact distal pulses.          Carotid pulses are 2+ on the right side and 2+ on the left side.      Radial pulses are 2+ on the right side and 2+ on the left side.       Dorsalis pedis pulses are 0 on the right side and 0 on the left side.       Posterior tibial pulses are 0 on the right side and 0 on the left side.     Heart sounds: Normal heart sounds. No murmur heard. No gallop.   Pulmonary:     Effort: Pulmonary effort is normal.     Breath sounds: Normal breath sounds.  Abdominal:     General: Bowel sounds are normal. There is no distension.     Palpations: Abdomen is soft.     Tenderness: There is abdominal tenderness (mild diffuse tenderness). There is no guarding.     Comments: Obese  Musculoskeletal:        General: No swelling (2-3 plus bilateral pitting edema. Thin skin, chronically ischemic appearing). Normal range of motion.  Skin:     Capillary Refill: Capillary refill takes less than 2 seconds.  Neurological:     Mental Status: Mental status is at baseline.    BNP (last 3 results) Recent Labs    12/31/19 2122 06/25/20 0456 06/30/20 0039  BNP 116.8* 168.5* 77.6    ProBNP (last 3 results) No results for input(s): PROBNP in the last 8760 hours. BMP Latest Ref Rng & Units 07/03/2020 07/02/2020 07/01/2020  Glucose 70 - 99 mg/dL 179(H) 228(H) 202(H)  BUN 6 - 20 mg/dL 20 20 17   Creatinine 0.61 - 1.24 mg/dL 1.31(H) 1.61(H) 1.69(H)  Sodium 135 - 145 mmol/L 133(L) 132(L) 133(L)  Potassium 3.5 - 5.1 mmol/L 4.5 4.3 4.0  Chloride 98 - 111 mmol/L 106 103 102  CO2 22 - 32 mmol/L 22 25 24   Calcium 8.9 - 10.3 mg/dL 8.1(L) 8.0(L) 8.2(L)   Hepatic Function Latest Ref Rng & Units 06/30/2020 06/27/2020 06/25/2020  Total Protein 6.5 - 8.1 g/dL 5.7(L) 5.3(L) 6.0(L)  Albumin 3.5 - 5.0 g/dL 1.8(L) 1.6(L) 1.9(L)  AST 15 - 41 U/L 15 13(L) 20  ALT 0 - 44 U/L 10 11 13   Alk  Phosphatase 38 - 126 U/L 76 81 102  Total Bilirubin 0.3 - 1.2 mg/dL 0.5 <0.1(L) 0.6  Bilirubin, Direct 0.0 - 0.2 mg/dL - <0.1 -   CBC Latest Ref Rng & Units 07/02/2020 07/01/2020 Jul 05, 2020  WBC 4.0 - 10.5 K/uL 2.8(L) 3.3(L) 3.5(L)  Hemoglobin 13.0 - 17.0 g/dL 10.0(L) 11.1(L) 11.5(L)  Hematocrit 39.0 - 52.0 % 32.6(L) 35.5(L) 37.4(L)  Platelets 150 - 400 K/uL 230 227 260   Lipid Panel     Component Value Date/Time   CHOL 305 (H) 06/25/2020 0711   TRIG 90 06/25/2020 0711   HDL 63 06/25/2020 0711   CHOLHDL 4.8 06/25/2020 0711   VLDL 18 06/25/2020 0711   LDLCALC 224 (H) 06/25/2020 0711   Cardiac Panel (last 3 results) No results for input(s): CKTOTAL, CKMB, TROPONINI, RELINDX in the last 72 hours.  HEMOGLOBIN A1C Lab Results  Component Value Date   HGBA1C 9.1 (H) 06/25/2020   MPG 214.47 06/25/2020   TSH Recent Labs    01/01/20 0849 01/18/20 0517 06/25/20 1244  TSH 1.354 2.317 1.714   Imaging:  CT of the head for code stroke without contrast 07/02/2020: 1.  Evidence of small vessel ischemia in the left caudate and the left thalamus, with the latter more apparent than on a Head CT 1 week ago. 2. No acute cortically based infarct or acute intracranial hemorrhage identified. ASPECTS 10. 3. These results were communicated to Dr. Cheral Marker at [place] 5/8/2022by text page via the [facility] messaging system. Electronically Signed   By: Genevie Ann M.D.   On: 07/02/2020 11:15   Carotid artery duplex 07/03/2020: Right Carotid: The extracranial vessels were near-normal with only minimal wall thickening or plaque.  Left Carotid: The extracranial vessels were near-normal with only minimal  wallthickening or plaque.  Vertebrals: Bilateral vertebral arteries demonstrate antegrade flow.  Subclavians: Normal flow hemodynamics were seen in bilateral subclavian arteries.Vertebrals:  Bilateral vertebral arteries demonstrate antegrade flow. Subclavians: Normal flow hemodynamics were seen in bilateral subclavian Arteries.   Cardiac Studies:  Cardiac database: EKG: Jul 05, 2020: Normal sinus rhythm, 91 bpm, normal axis, T wave inversions in the high lateral lateral leads suggestive of possible ischemia, without underlying injury pattern. Compared to prior EKG T wave inversions in the lateral leads are new.  07/02/2020: Normal sinus rhythm, 79 bpm, ST-T changes bilaterally.  Without underlying injury pattern no significant change compared to prior EKG.  Echocardiogram: 06/25/2020: 1. Left ventricular ejection fraction, by estimation, is 60 to 65%. The left ventricle has normal function. The left ventricle has no regional wall motion abnormalities. There is severe left ventricular hypertrophy. Left ventricular diastolic parameters  are consistent with Grade I diastolic dysfunction (impaired relaxation).  2. Right ventricular systolic function is normal. The right ventricular size is normal. There is moderately elevated pulmonary artery systolic pressure. The estimated right  ventricular systolic pressure is 46.5 mmHg.  3. Left atrial size was moderately dilated.  4. A small pericardial effusion is present. The pericardial effusion is circumferential. There is no evidence of cardiac tamponade.  5. The mitral valve is normal in structure. Mild to moderate mitral valve regurgitation. No evidence of mitral stenosis.  6. The aortic valve is calcified. There is moderate thickening of the aortic valve. Aortic valve regurgitation is mild. Mild aortic valve  stenosis. Aortic valve area, by VTI measures 1.26 cm. Aortic valve mean gradient measures 14.0 mmHg. Aortic valve Vmax measures 2.48 m/s.  7. The inferior vena cava is dilated in size with <50% respiratory variability, suggesting  right atrial pressure of 15 mmHg.  8. There is a small patent foramen ovale.  Stress Testing Lexiscan 06/27/2020: 1. No reversible ischemia. Probable inferior wall diaphragmatic attenuation. 2. Normal left ventricular wall motion. 3. Left ventricular ejection fraction 50% 4. Non invasive risk stratification*: Low  Heart Catheterization: Left heart catheterization 06/30/2019 ([facility]): Left main is patent.  LAD has mild diffuse irregularity. Diagonal and septal branches are patent. The left circumflex is a dominant vessel and has mild diffuse irregularity. OM-1 is a small caliber branch and has subtotal occlusion. Second obtuse marginal and PLV branches are patent.  Right coronary artery is small and nondominant. It is patent.  Left milligrams performed revealed preserved LV systolic function  left-ventricular ejection fraction 65%.   Plan: Medical therapy for very small caliber subtotal occlusion of OM1 branch.  Aspirin, statin and ACE inhibitor's. Beta-blockers not used due to  history of cocaine use.  Aggressive control of his blood pressure and diabetes. Rehab for cocaine use.   Scheduled Meds: . amLODipine  10 mg Oral Daily  . aspirin  81 mg Oral Daily  .  atorvastatin  80 mg Oral QHS  . carvedilol  6.25 mg Oral BID WC  . heparin  5,000 Units Subcutaneous Q8H  . insulin aspart  0-5 Units Subcutaneous QHS  . insulin aspart  0-9 Units Subcutaneous TID WC  . insulin glargine  25 Units Subcutaneous QHS  . pantoprazole  40 mg Oral Daily  . polyethylene glycol  17 g Oral BID  . pregabalin  75 mg Oral BID  . sacubitril-valsartan  1 tablet Oral BID  . senna-docusate  1 tablet Oral BID  . sertraline  50 mg Oral Daily  . tamsulosin  0.8 mg Oral Daily   Continuous Infusions: PRN Meds:.acetaminophen, hydrALAZINE, nitroGLYCERIN, ondansetron (ZOFRAN) IV  Assessment/Plan:   [name] is a 60 y.o.  African-American male who presents with a chief complaint of " chest pain and shortness of breath." His past medical history and cardiovascular risk factors include: known CAD with subtotal occlusion of OM1 (cath 06/2019 at [facility]), Hypertension, family history of premature CAD, hyperlipidemia, HFpEF, diabetes mellitus type 2, history of CVA with right-sided residual deficits, history of polysubstance abuse (Cocaine use  Last in 2021), obesity due to excess calories.  1.  Atypical chest pain, negative troponin, no dynamic EKG abnormality.  He has had recent cardiac catheterization in May 2021 revealing very mild disease and a subtotally occluded very small OM1.  Hence do not suspect ACS. 2.  Hypertensive urgency, this can explain his chest pain, also neurologic symptoms that he had.  He has chronic diastolic heart failure. 3.  History of stroke in the past with residual weakness, patient essentially wheelchair-bound and stays in a facility. 4.  Polysubstance use, patient denies any recent cocaine use, states that he has been "clean" since 2019 although cardiac catheterization notes from [place] in 06/30/2019 mentions cocaine use. 5.  Diabetes mellitus type 2 uncontrolled with peripheral arterial disease. 6.  Hyperlipidemia, patient now started on high intensity  high-dose statin.  Recommendation: We will increase his carvedilol to 25 mg twice daily both for hypertension and from heart failure standpoint to optimize.  Diabetes remains uncontrolled, could consider Farxiga for both heart failure and diabetes and potentially to help him with weight loss, cost can be an issue.  From cardiac standpoint, I do not anticipate to perform cardiac catheterization.  We will follow his symptoms sidelines.    Adrian Prows, MD, [facility]  07/03/2020, 12:41 PM Office: [phone] Pager: [phone]

## 2020-07-03 NOTE — Progress Notes (Signed)
VASCULAR LAB    Carotid duplex has been performed.  See CV proc for preliminary results.   Trinitie Mcgirr, RVT 07/03/2020, 11:11 AM

## 2020-07-03 NOTE — NC FL2 (Addendum)
[place] LEVEL OF CARE SCREENING TOOL     IDENTIFICATION  Patient Name: [name] Birthdate: 1960/08/12 Sex: male Admission Date (Current Location): 06/30/2020  [facility] [place] and [place] Number:  Herbalist and Address:  [facility], [place] [address] [place], [place] [address]      Provider Number: [id number]  Attending Physician Name and Address:  Hosie Poisson, MD  Relative Name and Phone Number:  [name] [phone]    Current Level of Care: Hospital Recommended Level of Care: [place] ([facility]) Prior Approval Number:    Date Approved/Denied:   PASRR Number:    Discharge Plan: Other (Comment) (ALF [facility])    Current Diagnoses: Patient Active Problem List   Diagnosis Date Noted  . Acute on chronic heart failure with preserved ejection fraction ([place])   . Chest pain 06/25/2020  . Pulmonary artery hypertension ([place]) 06/25/2020  . CKD (chronic kidney disease) 06/25/2020  . History of cerebrovascular accident (CVA) with residual deficit 06/25/2020  . Chronic kidney disease, stage 4 (severe) ([place]) 03/30/2020  . Morbid (severe) obesity due to excess calories ([place]) 03/30/2020  . Malnutrition of moderate degree 01/25/2020  . Palliative care by specialist   . Goals of care, counseling/discussion   . Nephrotic syndrome 01/06/2020  . Anasarca 01/06/2020  . Hypokalemia due to loss of potassium 01/04/2020  . Generalized body aches 01/01/2020  . Non-traumatic rhabdomyolysis 01/01/2020  . Grade I diastolic dysfunction 70/96/2836  . Microcytic anemia 01/01/2020  . Hypertensive urgency 12/31/2019  . Hypokalemia 12/31/2019  . Elevated CK   . Chronic diastolic CHF (congestive heart failure) ([place]) 12/31/2017  . Diabetes mellitus type 2, uncontrolled ([place])   . Pure hypercholesterolemia   . Hyperglycemia 09/28/2017  . Nonobstructive atherosclerosis of coronary artery 09/28/2017  . MDD (major depressive  disorder), recurrent episode, severe ([place]) 07/30/2017  . Osteoarthritis 04/12/2015  . Severe recurrent major depression with psychotic features ([place]) 01/04/2015  . Substance-related disorder ([place]) 10/30/2014  . Patient's noncompliance with other medical treatment and regimen 06/07/2014  . Cocaine dependence, uncomplicated ([place]) 62/94/7654  . Chronic pain 05/31/2014  . Cocaine abuse ([place]) 05/31/2014  . History of noncompliance with medical treatment 05/31/2014  . Malingering 05/31/2014  . Leukopenia 05/31/2014  . Diabetic neuropathy associated with diabetes mellitus due to underlying condition ([place]) 05/31/2014  . Diabetic neuropathy ([place]) 05/31/2014  . Congestive heart failure with left ventricular systolic dysfunction ([place]) 02/22/2014  . Depression, major, recurrent, severe with psychosis ([place]) 08/29/2013  . Suicidal ideations 08/28/2013  . Uncontrolled diabetes mellitus ([place]) 06/30/2013  . Atypical chest pain 12/09/2011  . Essential hypertension 12/09/2011  . Episodic mood disorder ([place]) 05/13/2011    Orientation RESPIRATION BLADDER Height & Weight     Self,Time,Situation,Place  Normal Continent Weight: 253 lb 8.5 oz (115 kg) Height:  5\' 8"  (172.7 cm)  BEHAVIORAL SYMPTOMS/MOOD NEUROLOGICAL BOWEL NUTRITION STATUS      Continent DIET: NAS/NCS  AMBULATORY STATUS COMMUNICATION OF NEEDS Skin   Limited Assist Verbally Other (Comment) (cracking, leg, bilateral)                       Personal Care Assistance Level of Assistance    Bathing Assistance: Limited assistance Feeding assistance: Independent Dressing Assistance: Limited assistance     Functional Limitations Info    Sight Info: Impaired Hearing Info: Adequate Speech Info: Adequate    SPECIAL CARE FACTORS FREQUENCY  PT (By licensed PT),OT (  By licensed OT)     PT Frequency: 3x min weekly OT Frequency: 3x min weekly            Contractures Contractures Info: Not present    Additional Factors Info  Code  Status,Psychotropic Code Status Info: FULL Allergies Info: Other-Allergy to GRITS , unknown exactly the specific ingredient he is allergic too Psychotropic Info: sertraline (ZOLOFT) tablet 50 mg daily Insulin Sliding Scale Info: insulin aspart (novoLOG) injection 0-5 Units daily at bedtime,insulin aspart (novoLOG) injection 0-9 Units 3 times daily with meals,insulin glargine (LANTUS) injection 25 Units daily at bedtime       TAKE these medications       amLODipine 10 MG tablet Commonly known as: NORVASC Take 1 tablet (10 mg total) by mouth daily. What changed: Another medication with the same name was removed. Continue taking this medication, and follow the directions you see here.   ammonium lactate 12 % lotion Commonly known as: LAC-HYDRIN Apply 1 application topically 2 (two) times daily. Both legs   aspirin 81 MG chewable tablet Chew 1 tablet (81 mg total) by mouth daily. Start taking on: Jul 06, 2020   atorvastatin 80 MG tablet Commonly known as: LIPITOR Take 1 tablet (80 mg total) by mouth at bedtime. What changed:   medication strength  how much to take  when to take this   Basaglar KwikPen 100 UNIT/ML Inject 30 Units into the skin at bedtime. What changed:   how much to take  Another medication with the same name was removed. Continue taking this medication, and follow the directions you see here.   carvedilol 12.5 MG tablet Commonly known as: COREG Take 1 tablet (12.5 mg total) by mouth 2 (two) times daily with a meal.   feeding supplement Liqd Take 237 mLs by mouth 2 (two) times daily between meals.   furosemide 80 MG tablet Commonly known as: LASIX Take 0.5 tablets (40 mg total) by mouth daily.   insulin aspart 100 UNIT/ML injection Commonly known as: novoLOG Inject 12 Units into the skin 3 (three) times daily before meals. For blood sugars 0-150 give 0 units of insulin, 151-200 give 2 units of insulin, 201-250 give 4 units, 251-300 give 6  units, 301-350 give 8 units, 351-400 give 10 units,> 400 give 12 units and call M.D. What changed:   how much to take  when to take this  additional instructions   pantoprazole 40 MG tablet Commonly known as: PROTONIX Take 1 tablet (40 mg total) by mouth daily. Start taking on: Jul 06, 2020   polyethylene glycol 17 g packet Commonly known as: MIRALAX / GLYCOLAX Take 17 g by mouth daily as needed. What changed: reasons to take this   pregabalin 75 MG capsule Commonly known as: LYRICA Take 1 capsule (75 mg total) by mouth 2 (two) times daily.   sacubitril-valsartan 49-51 MG Commonly known as: ENTRESTO Take 1 tablet by mouth 2 (two) times daily.   sertraline 50 MG tablet Commonly known as: ZOLOFT Take 1 tablet (50 mg total) by mouth daily.   tamsulosin 0.4 MG Caps capsule Commonly known as: FLOMAX Take 2 capsules (0.8 mg total) by mouth daily. Start taking on: Jul 06, 2020 What changed: how much to take       Relevant Imaging Results:  Relevant Lab Results:   Additional Information SSN-[id number]  Trula Ore, LCSWA

## 2020-07-03 NOTE — Progress Notes (Signed)
STROKE TEAM PROGRESS NOTE   INTERVAL HISTORY No visitors at bedside He reports his transient blurred vision, jaw pain and chest pain remains resolved. Patient reports he lives at a facility and uses an IT trainer wheelchair with some limited walking ability with assistance. CT scan of the head shows ill-defined ischemia in the left caudate and thalamus which does not explain his presenting symptoms adequately needs further evaluation with MRI MRI is pending. We discussed plan of care and ongoing stroke work up. He did not ask questions.   Vitals:   07/03/20 0539 07/03/20 0721 07/03/20 0738 07/03/20 0813  BP: 129/85 (!) 142/95  (!) 142/95  Pulse: 76 79 81   Resp: 14     Temp: 97.8 F (36.6 C) 97.8 F (36.6 C)    TempSrc: Oral Oral    SpO2: 99%     Weight:      Height:       CBC:  Recent Labs  Lab 07/01/20 0245 07/02/20 0126  WBC 3.3* 2.8*  NEUTROABS  --  0.8*  HGB 11.1* 10.0*  HCT 35.5* 32.6*  MCV 80.3 79.9*  PLT 227 742   Basic Metabolic Panel:  Recent Labs  Lab 07/01/20 0245 07/02/20 0126  NA 133* 132*  K 4.0 4.3  CL 102 103  CO2 24 25  GLUCOSE 202* 228*  BUN 17 20  CREATININE 1.69* 1.61*  CALCIUM 8.2* 8.0*   Lipid Panel: No results for input(s): CHOL, TRIG, HDL, CHOLHDL, VLDL, LDLCALC in the last 168 hours. HgbA1c: No results for input(s): HGBA1C in the last 168 hours. Urine Drug Screen:  Recent Labs  Lab 06/30/20 0933  LABOPIA NONE DETECTED  COCAINSCRNUR NONE DETECTED  LABBENZ NONE DETECTED  AMPHETMU NONE DETECTED  THCU NONE DETECTED  LABBARB NONE DETECTED    Alcohol Level No results for input(s): ETH in the last 168 hours.  IMAGING/DIAGNOSTICS CTH Code Stroke  Evidence of small vessel ischemia in the left caudate and the left thalamus, with the latter more apparent than on a Head CT 1 week ago. 2. No acute cortically based infarct or acute intracranial hemorrhage identified.  2D Echo 06/25/2020: 1. Left ventricular ejection fraction, by  estimation, is 60 to 65%. The left ventricle has normal function. The left ventricle has no regional wall motion abnormalities. There is severe left ventricular hypertrophy. Left ventricular diastolic parameters  are consistent with Grade I diastolic dysfunction (impaired relaxation).  2. Right ventricular systolic function is normal. The right ventricular size is normal. There is moderately elevated pulmonary artery systolic pressure. The estimated right ventricular systolic pressure is 59.5 mmHg.  3. Left atrial size was moderately dilated.  4. A small pericardial effusion is present. The pericardial effusion is circumferential. There is no evidence of cardiac tamponade.  5. The mitral valve is normal in structure. Mild to moderate mitral valve regurgitation. No evidence of mitral stenosis.  6. The aortic valve is calcified. There is moderate thickening of the aortic valve. Aortic valve regurgitation is mild. Mild aortic valve  stenosis. Aortic valve area, by VTI measures 1.26 cm. Aortic valve mean gradient measures 14.0 mmHg. Aortic valve Vmax measures 2.48 m/s.  7. The inferior vena cava is dilated in size with <50% respiratory variability, suggesting right atrial pressure of 15 mmHg.  8. There is a small patent foramen ovale.  Carotid Duplex 07/03/2020 Right Carotid: The extracranial vessels were near-normal with only minimal wall thickening or plaque.   Left Carotid: The extracranial vessels were near-normal with only minimal  wall thickening or plaque.   Vertebrals: Bilateral vertebral arteries demonstrate antegrade flow.  Subclavians: Normal flow hemodynamics were seen in bilateral subclavian arteries.   PHYSICAL EXAM  GENERAL: Awake, alert, sitting up in chair at bedside in NAD.  Head: Normocephalic and atraumatic, dry mm EENT: Poor dentition, no OP obstruction, normal conjunctivae LUNGS: Normal respiratory effort. Non-labored breathing CV: Regular rate on telemetry Ext:  warm, without obvious deformity  Mental Status: Awake, alert, and oriented to person, place, time, and situation. He is able to provide a clear and coherent history of present illness.  Speech/Language: speech is minimally dysarthric and patient states that it is different than his baseline speech but patient also has mild dysarthria at baseline 2/2 poor dentition. No aphasia noted.  Naming, repetition, fluency, and comprehension intact. No neglect present.  Cranial Nerves:  II: PERRL 3 mm/brisk. Visual fields full but with subjective blurry vision in left visual fields OU bilaterally III, IV, VI: EOMI without ptosis V: Sensation is intact to light touch and symmetrical to face.  VII: Face is symmetric resting and smiling.  VIII: Hearing is intact to voice IX, X: Palate elevation is symmetric. Phonation normal.  XI: Normal sternocleidomastoid and trapezius muscle strength XII: Tongue protrudes midline without fasciculations.   Motor: Generalized weakness at baseline with right-sided deficits from previous stroke. Right upper extremity 4-/5 strength, left upper extremity 4/5 strength. Left lower extremity 4/5 strength with right lower extremity 3/5 strength. Patient able to sustain antigravity movement without vertical drift on the LUE and LLE. Tone is normal. Bulk is normal.  Sensation: Intact to light touch bilaterally in all four extremities. Sensation to cool temperature absent in bilateral upper extremities but present and symmetric on the face.   Coordination: FTN intact bilaterally. HKS unable to assess 2/2 weakness.  Plantars: Toes upgoing bilaterally Gait: Deferred  ASSESSMENT/PLAN  [name] is a 60 y.o. male with PMH of multiple stroke risk factors with uncontrolled diabetes mellitus, hypertension, uncontrolled hyperlipidemia, multiple CVAs with residual right-sided weakness, coronary artery disease, pulmonary artery hypertension, congestive heart failure, cocaine abuse. Also with  bipolar affective disorder and CKD stage IV. He presented to the ED 06/30/2020 and was admitted d/t chest pain. On 5/9 he developed left hemifield visual deficit with hypotension following 2 doses of sublingual nitroglycerin. His CT code stroke showed small vessel ischemia in the left caudate and the left thalamus, with the latter more apparent than on a Head CT performed one week ago. No acute cortically based infarct or acute intracranial hemorrhage identified. His symptoms have resolved. His further stroke work up, including MRI/MRA is pending.   Code Stroke HCT: Evidence of small vessel ischemia in the left caudate and the left thalamus, with the latter more apparent than on a Head CT 1 week ago.  MRI  Pending  MRA  Pending  Carotid Doppler:   2D Echo +Small PFO, EF 60-65%  LDL 224  HgbA1c 9.1  VTE prophylaxis - on heparin sq ppx     Diet   Diet heart healthy/carb modified Room service appropriate? Yes; Fluid consistency: Thin   No AC/AP prior to admission  On ASA 81mg  now  DAPT may be warranted x 3 weeks (MRI Pending)   Therapy recommendations:  ALF  Disposition:  ALF  Hypertension  Stable . Permissive hypertension (OK if < 220/120) but gradually normalize in 5-7 days . Long-term BP goal normotensive  Hyperlipidemia  Home meds:  Lipitor 20mg    LDL 224, goal < 70  High  intensity statin: Lipitor 80mg   Continue statin at discharge  Diabetes type II Uncontrolled  HgbA1c 9.1, goal < 7.0  CBGs Recent Labs    07/02/20 1633 07/02/20 2136 07/03/20 0743  GLUCAP 195* 174* 266*      Management per primary team   Needs close follow up for improved control   Other Stroke Risk Factors  Former Cigarette smoker.  Current Substance abuse of cocaine reported by patient. UDS was negative.   Obesity, Body mass index is 38.55 kg/m., BMI >/= 30 associated with increased stroke risk, recommend weight loss, diet and exercise as appropriate   Family hx stroke  (sister)  Coronary artery disease  Congestive heart failure  Other Active Problems Medical therapy for very small caliber subtotal occlusion of OM1 branch. Aspirin, statin and ACE inhibitor's recommended. Beta-blockers not used due to  history of cocaine use.   Hospital day # 0 I have personally obtained history,examined this patient, reviewed notes, independently viewed imaging studies, participated in medical decision making and plan of care.ROS completed by me personally and pertinent positives fully documented  I have made any additions or clarifications directly to the above note. Agree with note above.  Patient developed sudden onset of blurred vision and dysarthria in the setting of taking 2 nitroglycerin tablets CT scan suggested recurrent left basal ganglia thalamic ischemia which may explain the dysarthria but not necessarily with blurred vision.  Await MRI to clarify the picture.  Continue aspirin for now and further recommendations after MRI.  Greater than 50% time during this 35-minute visit was spent in counseling and coordination of care about 2 strokes in TIA symptoms and answering questions and discussion with care team  Antony Contras, MD Medical Director [place] Pager: [phone] 07/03/2020 4:07 PM   To contact Stroke Continuity provider, please refer to [email or web address] After hours, contact General Neurology

## 2020-07-03 NOTE — Progress Notes (Addendum)
Pt blood pressure 115/87. Paged attending and verbal was given to hold administration of Entresto at this time due to blood pressure and a recent stroke.

## 2020-07-03 NOTE — TOC Initial Note (Addendum)
Transition of Care [facility]) - Initial/Assessment Note    Patient Details  Name: [name] MRN: [id number] Date of Birth: 1960-06-24  [facility]) CM/SW Contact:    Trula Ore, [place] Phone Number: 07/03/2020, 1:01 PM  Clinical Narrative:                  CSW spoke with patient. Patient confirmed he is from [facility]. Plan is for patient to return to [facility]. CSW spoke with Trivoli from [place] [facility] who confirmed she can accept patient back when medically ready, . Patient request a walker at DC. CSW informed case Freight forwarder. Case manager checked on ordering a walker for patient. Insurance will not covier due to patient  received walker last year. CSW informed patient and informed patient of cost if patient wants to purchase one. CSW will continue to follow and assist with discharge planning needs.  Update-Deborah CM checked into walker again for patient. Patient will receive walker. CSW informed patient. CSW will continue to follow.  Expected Discharge Plan: Assisted Living (from [facility]) Barriers to Discharge: Continued Medical Work up   Patient Goals and CMS Choice Patient states their goals for this hospitalization and ongoing recovery are:: to go to ALF CMS Medicare.gov Compare Post Acute Care list provided to:: Patient Choice offered to / list presented to : Patient  Expected Discharge Plan and Services Expected Discharge Plan: Assisted Living (from [facility]) [name] Referral: Clinical Social Work     Living arrangements for the past 2 months: [place] ([place])                                      Prior Living Arrangements/Services Living arrangements for the past 2 months: [place] ([place]) Lives with:: Self,Facility Resident (From [place]) Patient language and need for interpreter reviewed:: Yes Do you feel safe going back to the place where you live?: Yes       Need for Family Participation in Patient Care: Yes (Comment) Care giver support system in place?: Yes (comment)   Criminal Activity/Legal Involvement Pertinent to Current Situation/Hospitalization: No - Comment as needed  Activities of Daily Living Home Assistive Devices/Equipment: Cane (specify quad or straight) ADL Screening (condition at time of admission) Patient's cognitive ability adequate to safely complete daily activities?: Yes Is the patient deaf or have difficulty hearing?: No Does the patient have difficulty seeing, even when wearing glasses/contacts?: No Does the patient have difficulty concentrating, remembering, or making decisions?: No Patient able to express need for assistance with ADLs?: Yes Does the patient have difficulty dressing or bathing?: No Independently performs ADLs?: Yes (appropriate for developmental age) Communication: Independent Dressing (OT): Independent Is this a change from baseline?: Pre-admission baseline Grooming: Independent Is this a change from baseline?: Pre-admission baseline Feeding: Independent Bathing: Independent Is this a change from baseline?: Pre-admission baseline Toileting: Independent Is this a change from baseline?: Pre-admission baseline In/Out Bed: Independent with device (comment) Is this a change from baseline?: Pre-admission baseline Walks in Home: Independent with device (comment) Is this a change from baseline?: Pre-admission baseline Does the patient have difficulty walking or climbing stairs?: Yes Weakness of Legs: Both Weakness of Arms/Hands: Both  Permission Sought/Granted Permission sought to share information with : Case Manager,Family Chief Financial Officer Permission granted to share information with : Yes, Verbal Permission  Granted     Permission granted to share info w AGENCY: ALF        Emotional Assessment   Attitude/Demeanor/Rapport: Gracious Affect (typically observed):  Calm Orientation: : Oriented to Self,Oriented to Place,Oriented to  Time,Oriented to Situation Alcohol / Substance Use: Not Applicable Psych Involvement: No (comment)  Admission diagnosis:  Chest pain [R07.9] Chest pain, unspecified type [R07.9] Patient Active Problem List   Diagnosis Date Noted  . Acute on chronic heart failure with preserved ejection fraction ([place])   . Chest pain 06/25/2020  . Pulmonary artery hypertension ([place]) 06/25/2020  . CKD (chronic kidney disease) 06/25/2020  . History of cerebrovascular accident (CVA) with residual deficit 06/25/2020  . Chronic kidney disease, stage 4 (severe) ([place]) 03/30/2020  . Morbid (severe) obesity due to excess calories ([place]) 03/30/2020  . Malnutrition of moderate degree 01/25/2020  . Palliative care by specialist   . Goals of care, counseling/discussion   . Nephrotic syndrome 01/06/2020  . Anasarca 01/06/2020  . Hypokalemia due to loss of potassium 01/04/2020  . Generalized body aches 01/01/2020  . Non-traumatic rhabdomyolysis 01/01/2020  . Grade I diastolic dysfunction 24/49/7530  . Microcytic anemia 01/01/2020  . Hypertensive urgency 12/31/2019  . Hypokalemia 12/31/2019  . Elevated CK   . Chronic diastolic CHF (congestive heart failure) ([place]) 12/31/2017  . Diabetes mellitus type 2, uncontrolled ([place])   . Pure hypercholesterolemia   . Hyperglycemia 09/28/2017  . Nonobstructive atherosclerosis of coronary artery 09/28/2017  . MDD (major depressive disorder), recurrent episode, severe ([place]) 07/30/2017  . Osteoarthritis 04/12/2015  . Severe recurrent major depression with psychotic features ([place]) 01/04/2015  . Substance-related disorder ([place]) 10/30/2014  . Patient's noncompliance with other medical treatment and regimen 06/07/2014  . Cocaine dependence, uncomplicated ([place]) 07/05/209  . Chronic pain 05/31/2014  . Cocaine abuse ([place]) 05/31/2014  . History of noncompliance with medical treatment 05/31/2014  . Malingering  05/31/2014  . Leukopenia 05/31/2014  . Diabetic neuropathy associated with diabetes mellitus due to underlying condition ([place]) 05/31/2014  . Diabetic neuropathy ([place]) 05/31/2014  . Congestive heart failure with left ventricular systolic dysfunction ([place]) 02/22/2014  . Depression, major, recurrent, severe with psychosis ([place]) 08/29/2013  . Suicidal ideations 08/28/2013  . Uncontrolled diabetes mellitus ([place]) 06/30/2013  . Atypical chest pain 12/09/2011  . Essential hypertension 12/09/2011  . Episodic mood disorder ([place]) 05/13/2011   PCP:  [name], NP Pharmacy:   [id number] [address], [place] [address] Phone: [phone] Fax: [phone]     Social Determinants of Health (SDOH) Interventions    Readmission Risk Interventions Readmission Risk Prevention Plan 01/12/2020  Transportation Screening Complete  Palliative Care Screening Not Applicable  Some recent data might be hidden

## 2020-07-03 NOTE — Anesthesia Preprocedure Evaluation (Addendum)
Anesthesia Evaluation  Patient identified by MRN, date of birth, ID band Patient awake    Reviewed: Allergy & Precautions, NPO status , Patient's Chart, lab work & pertinent test results  Airway Mallampati: I  TM Distance: >3 FB Neck ROM: Full    Dental no notable dental hx. (+) Edentulous Upper, Missing, Dental Advisory Given,    Pulmonary former smoker,    Pulmonary exam normal breath sounds clear to auscultation       Cardiovascular hypertension, Pt. on home beta blockers and Pt. on medications + CAD and +CHF  Normal cardiovascular exam Rhythm:Regular Rate:Normal     Neuro/Psych PSYCHIATRIC DISORDERS Depression Bipolar Disorder TIACVA (difficulty walking), Residual Symptoms    GI/Hepatic (+)     substance abuse  cocaine use,   Endo/Other  diabetes, Poorly Controlled, Type 2, Insulin DependentObesity   Renal/GU Renal InsufficiencyRenal diseaseCr 1.31, K 4.5     Musculoskeletal  (+) Arthritis ,   Abdominal   Peds  Hematology   Anesthesia Other Findings MRI for suspected stroke  Reproductive/Obstetrics                            Anesthesia Physical Anesthesia Plan  ASA: III  Anesthesia Plan: General   Post-op Pain Management:    Induction: Intravenous  PONV Risk Score and Plan: 2 and Treatment may vary due to age or medical condition, Ondansetron and Midazolam  Airway Management Planned: LMA  Additional Equipment:   Intra-op Plan:   Post-operative Plan: Extubation in OR  Informed Consent:   Plan Discussed with:   Anesthesia Plan Comments:         Anesthesia Quick Evaluation

## 2020-07-04 ENCOUNTER — Encounter (HOSPITAL_COMMUNITY): Payer: Self-pay | Admitting: Internal Medicine

## 2020-07-04 ENCOUNTER — Inpatient Hospital Stay (HOSPITAL_COMMUNITY): Payer: Medicare HMO | Admitting: Anesthesiology

## 2020-07-04 ENCOUNTER — Inpatient Hospital Stay (HOSPITAL_COMMUNITY): Payer: Medicare HMO

## 2020-07-04 ENCOUNTER — Encounter (HOSPITAL_COMMUNITY)
Admission: EM | Disposition: A | Discharge status: Nursing Home (Includes ICF) | Payer: Self-pay | Source: Home / Self Care | Attending: Internal Medicine

## 2020-07-04 DIAGNOSIS — N182 Chronic kidney disease, stage 2 (mild): Secondary | ICD-10-CM

## 2020-07-04 HISTORY — PX: RADIOLOGY WITH ANESTHESIA: SHX6223

## 2020-07-04 LAB — GLUCOSE, CAPILLARY
Glucose-Capillary: 191 mg/dL — ABNORMAL HIGH (ref 70–99)
Glucose-Capillary: 197 mg/dL — ABNORMAL HIGH (ref 70–99)
Glucose-Capillary: 153 mg/dL — ABNORMAL HIGH (ref 70–99)
Glucose-Capillary: 246 mg/dL — ABNORMAL HIGH (ref 70–99)
Glucose-Capillary: 347 mg/dL — ABNORMAL HIGH (ref 70–99)

## 2020-07-04 LAB — SARS CORONAVIRUS 2 (TAT 6-24 HRS): SARS Coronavirus 2: NEGATIVE

## 2020-07-04 SURGERY — MRI WITH ANESTHESIA
Anesthesia: General

## 2020-07-04 MED ORDER — FENTANYL CITRATE (PF) 100 MCG/2ML IJ SOLN
INTRAMUSCULAR | Status: DC | PRN
  Administered 2020-07-04: 100 ug via INTRAVENOUS

## 2020-07-04 MED ORDER — SUGAMMADEX SODIUM 200 MG/2ML IV SOLN
INTRAVENOUS | Status: DC | PRN
  Administered 2020-07-04: 100 mg via INTRAVENOUS
  Administered 2020-07-04: 200 mg via INTRAVENOUS

## 2020-07-04 MED ORDER — CHLORHEXIDINE GLUCONATE 0.12 % MT SOLN
OROMUCOSAL | Status: AC
  Administered 2020-07-04: 15 mL via OROMUCOSAL
  Filled 2020-07-04: qty 15

## 2020-07-04 MED ORDER — LACTATED RINGERS IV SOLN: INTRAVENOUS | Status: DC

## 2020-07-04 MED ORDER — PROPOFOL 10 MG/ML IV BOLUS
INTRAVENOUS | Status: DC | PRN
  Administered 2020-07-04: 150 mg via INTRAVENOUS

## 2020-07-04 MED ORDER — DEXAMETHASONE SODIUM PHOSPHATE 10 MG/ML IJ SOLN
INTRAMUSCULAR | Status: DC | PRN
  Administered 2020-07-04: 5 mg via INTRAVENOUS

## 2020-07-04 MED ORDER — LIDOCAINE 2% (20 MG/ML) 5 ML SYRINGE
INTRAMUSCULAR | Status: DC | PRN
  Administered 2020-07-04: 40 mg via INTRAVENOUS

## 2020-07-04 MED ORDER — INSULIN GLARGINE 100 UNIT/ML ~~LOC~~ SOLN
30.0000 [IU] | Freq: Every day | SUBCUTANEOUS | Status: DC
  Administered 2020-07-04: 30 [IU] via SUBCUTANEOUS
  Filled 2020-07-04 (×2): qty 0.3

## 2020-07-04 MED ORDER — ONDANSETRON HCL 4 MG/2ML IJ SOLN
INTRAMUSCULAR | Status: DC | PRN
  Administered 2020-07-04: 4 mg via INTRAVENOUS

## 2020-07-04 MED ORDER — ROCURONIUM BROMIDE 10 MG/ML (PF) SYRINGE
PREFILLED_SYRINGE | INTRAVENOUS | Status: DC | PRN
  Administered 2020-07-04: 50 mg via INTRAVENOUS

## 2020-07-04 MED ORDER — ORAL CARE MOUTH RINSE: 15.0000 mL | Freq: Once | OROMUCOSAL | Status: AC

## 2020-07-04 MED ORDER — MIDAZOLAM HCL 2 MG/2ML IJ SOLN
INTRAMUSCULAR | Status: DC | PRN
  Administered 2020-07-04: 2 mg via INTRAVENOUS

## 2020-07-04 MED ORDER — CHLORHEXIDINE GLUCONATE 0.12 % MT SOLN: 15.0000 mL | Freq: Once | OROMUCOSAL | Status: AC

## 2020-07-04 NOTE — Progress Notes (Signed)
STROKE TEAM PROGRESS NOTE   INTERVAL HISTORY No visitors at bedside He reports normal full improvement in his transient blurred vision, jaw pain and chest pain  MRI shows no acute stroke.  There is evidence of old hemorrhagic left pontine and thalamic stroke as well as extensive small vessel disease changes involving brainstem, corpus callosum and subcortical white matter. I have also reviewed his prior imaging studies results in [place] which had shown hemorrhagic pontine, left midbrain and thalamic infarcts in September 2021 in [place].  Vitals:   07/04/20 0813 07/04/20 0928 07/04/20 0937 07/04/20 1142  BP: (!) 171/105 128/88 134/87 (!) 141/97  Pulse: 79 76 80 75  Resp:  19 17 18   Temp:  98 F (36.7 C)  98.5 F (36.9 C)  TempSrc:    Oral  SpO2:  100%  99%  Weight:      Height:       CBC:  Recent Labs  Lab 07/01/20 0245 07/02/20 0126  WBC 3.3* 2.8*  NEUTROABS  --  0.8*  HGB 11.1* 10.0*  HCT 35.5* 32.6*  MCV 80.3 79.9*  PLT 227 376   Basic Metabolic Panel:  Recent Labs  Lab 07/02/20 0126 07/03/20 0950  NA 132* 133*  K 4.3 4.5  CL 103 106  CO2 25 22  GLUCOSE 228* 179*  BUN 20 20  CREATININE 1.61* 1.31*  CALCIUM 8.0* 8.1*   Lipid Panel: No results for input(s): CHOL, TRIG, HDL, CHOLHDL, VLDL, LDLCALC in the last 168 hours. HgbA1c: No results for input(s): HGBA1C in the last 168 hours. Urine Drug Screen:  Recent Labs  Lab 06/30/20 0933  LABOPIA NONE DETECTED  COCAINSCRNUR NONE DETECTED  LABBENZ NONE DETECTED  AMPHETMU NONE DETECTED  THCU NONE DETECTED  LABBARB NONE DETECTED    Alcohol Level No results for input(s): ETH in the last 168 hours.  IMAGING/DIAGNOSTICS CTH Code Stroke  Evidence of small vessel ischemia in the left caudate and the left thalamus, with the latter more apparent than on a Head CT 1 week ago. 2. No acute cortically based infarct or acute intracranial hemorrhage identified.  2D Echo 06/25/2020: 1. Left ventricular  ejection fraction, by estimation, is 60 to 65%. The left ventricle has normal function. The left ventricle has no regional wall motion abnormalities. There is severe left ventricular hypertrophy. Left ventricular diastolic parameters  are consistent with Grade I diastolic dysfunction (impaired relaxation).  2. Right ventricular systolic function is normal. The right ventricular size is normal. There is moderately elevated pulmonary artery systolic pressure. The estimated right ventricular systolic pressure is 28.3 mmHg.  3. Left atrial size was moderately dilated.  4. A small pericardial effusion is present. The pericardial effusion is circumferential. There is no evidence of cardiac tamponade.  5. The mitral valve is normal in structure. Mild to moderate mitral valve regurgitation. No evidence of mitral stenosis.  6. The aortic valve is calcified. There is moderate thickening of the aortic valve. Aortic valve regurgitation is mild. Mild aortic valve  stenosis. Aortic valve area, by VTI measures 1.26 cm. Aortic valve mean gradient measures 14.0 mmHg. Aortic valve Vmax measures 2.48 m/s.  7. The inferior vena cava is dilated in size with <50% respiratory variability, suggesting right atrial pressure of 15 mmHg.  8. There is a small patent foramen ovale.  Carotid Duplex 07/03/2020 Right Carotid: The extracranial vessels were near-normal with only minimal wall thickening or plaque.   Left Carotid: The extracranial vessels were near-normal with only minimal wall thickening  or plaque.   Vertebrals: Bilateral vertebral arteries demonstrate antegrade flow.  Subclavians: Normal flow hemodynamics were seen in bilateral subclavian arteries.   PHYSICAL EXAM  GENERAL: Awake, alert, sitting up in chair at bedside in NAD.  Head: Normocephalic and atraumatic, dry mm EENT: Poor dentition, no OP obstruction, normal conjunctivae LUNGS: Normal respiratory effort. Non-labored breathing CV: Regular  rate on telemetry Ext: warm, without obvious deformity  Mental Status: Awake, alert, and oriented to person, place, time, and situation. He is able to provide a clear and coherent history of present illness.  Speech/Language: speech is minimally dysarthric and patient states that it is different than his baseline speech but patient also has mild dysarthria at baseline 2/2 poor dentition. No aphasia noted.  Naming, repetition, fluency, and comprehension intact. No neglect present.  Cranial Nerves:  II: PERRL 3 mm/brisk. Visual fields full but with subjective blurry vision in left visual fields OU bilaterally III, IV, VI: EOMI without ptosis V: Sensation is intact to light touch and symmetrical to face.  VII: Face is symmetric resting and smiling.  VIII: Hearing is intact to voice IX, X: Palate elevation is symmetric. Phonation normal.  XI: Normal sternocleidomastoid and trapezius muscle strength XII: Tongue protrudes midline without fasciculations.   Motor: Generalized weakness at baseline with right-sided deficits from previous stroke. Right upper extremity 4-/5 strength, left upper extremity 4/5 strength. Left lower extremity 4/5 strength with right lower extremity 3/5 strength. Patient able to sustain antigravity movement without vertical drift on the LUE and LLE. Tone is normal. Bulk is normal.  Sensation: Intact to light touch bilaterally in all four extremities. Sensation to cool temperature absent in bilateral upper extremities but present and symmetric on the face.   Coordination: FTN intact bilaterally. HKS unable to assess 2/2 weakness.  Plantars: Toes upgoing bilaterally Gait: Deferred  ASSESSMENT/PLAN  [name] is a 60 y.o. male with PMH of multiple stroke risk factors with uncontrolled diabetes mellitus, hypertension, uncontrolled hyperlipidemia, multiple CVAs with residual right-sided weakness, coronary artery disease, pulmonary artery hypertension, congestive heart failure,  cocaine abuse. Also with bipolar affective disorder and CKD stage IV. He presented to the ED 06/30/2020 and was admitted d/t chest pain. On 5/9 he developed left hemifield visual deficit with hypotension following 2 doses of sublingual nitroglycerin. His CT code stroke showed small vessel ischemia in the left caudate and the left thalamus, with the latter more apparent than on a Head CT performed one week ago. No acute cortically based infarct or acute intracranial hemorrhage identified. His symptoms have resolved. His further stroke work up, including MRI/MRA is pending.   Code Stroke HCT: Evidence of small vessel ischemia in the left caudate and the left thalamus, with the latter more apparent than on a Head CT 1 week ago.  MRI no acute infarct.  Old lacunar infarcts involving brainstem, thalamus and subcortical white matter.  Extensive chronic small vessel ischemic changes  MRA no large vessel stenosis or occlusion  Carotid Doppler: Minimum thickening without significant stenosis in either carotid bifurcation  2D Echo +Small PFO, EF 60-65%  LDL 224  HgbA1c 9.1  VTE prophylaxis - on heparin sq ppx     Diet   Diet heart healthy/carb modified Room service appropriate? Yes; Fluid consistency: Thin   No AC/AP prior to admission  On ASA 81mg  now  DAPT may be warranted x 3 weeks (MRI Pending)   Therapy recommendations:  ALF  Disposition:  ALF  Hypertension  Stable . Permissive hypertension (OK if <  220/120) but gradually normalize in 5-7 days . Long-term BP goal normotensive  Hyperlipidemia  Home meds:  Lipitor 20mg    LDL 224, goal < 70  High intensity statin: Lipitor 80mg   Continue statin at discharge  Diabetes type II Uncontrolled  HgbA1c 9.1, goal < 7.0  CBGs Recent Labs    07/04/20 0805 07/04/20 0928 07/04/20 1141  GLUCAP 191* 153* 246*      Management per primary team   Needs close follow up for improved control   Other Stroke Risk Factors  Former  Cigarette smoker.  Current Substance abuse of cocaine reported by patient. UDS was negative.   Obesity, Body mass index is 38.55 kg/m., BMI >/= 30 associated with increased stroke risk, recommend weight loss, diet and exercise as appropriate   Family hx stroke (sister)  Coronary artery disease  Congestive heart failure  Other Active Problems Medical therapy for very small caliber subtotal occlusion of OM1 branch. Aspirin, statin and ACE inhibitor's recommended. Beta-blockers not used due to  history of cocaine use.   Hospital day # 1 Patient had transient multifocal neurological symptoms of blurred vision and dysarthria in the setting of relative hypotension in the background of previous lacunar strokes and extensive small vessel disease changes in the brain.  No acute infarct is noted.  Continue aspirin for secondary stroke prevention and aggressive risk factor modification.  Discussed with Dr. Karleen Hampshire greater than 50% time during this 25-minute visit was spent in counseling and coordination of care about 2 strokes in TIA symptoms and answering questions and discussion with care team.  Follow-up as an outpatient stroke clinic in 6 weeks.  Stroke team will sign off.  Kindly call for questions.  Antony Contras, MD Medical Director [facility] Pager: [phone] 07/04/2020 2:57 PM   To contact Stroke Continuity provider, please refer to [email or web address] After hours, contact General Neurology

## 2020-07-04 NOTE — Anesthesia Procedure Notes (Addendum)
Procedure Name: Intubation Date/Time: 07/04/2020 8:45 AM Performed by: Janace Litten, CRNA Pre-anesthesia Checklist: Patient identified, Emergency Drugs available, Suction available and Patient being monitored Patient Re-evaluated:Patient Re-evaluated prior to induction Oxygen Delivery Method: Circle System Utilized Preoxygenation: Pre-oxygenation with 100% oxygen Induction Type: IV induction Ventilation: Mask ventilation without difficulty Laryngoscope Size: Mac and 4 Grade View: Grade II Tube type: Oral Tube size: 7.5 mm Number of attempts: 1 Airway Equipment and Method: Stylet Placement Confirmation: ETT inserted through vocal cords under direct vision,  positive ETCO2 and breath sounds checked- equal and bilateral Secured at: 23 cm Tube secured with: Tape Dental Injury: Teeth and Oropharynx as per pre-operative assessment  Comments: Unable to obtain grade I view due to limited neck extension from head positioning in MRI's head rest

## 2020-07-04 NOTE — Transfer of Care (Signed)
Immediate Anesthesia Transfer of Care Note  Patient: [name]  Procedure(s) Performed: MRI WITH ANESTHESIA BRAIN WITHOUT CONTRAST AND HEAD WITHOUT CONTRAST (N/A )  Patient Location: PACU  Anesthesia Type:General  Level of Consciousness: awake, alert  and patient cooperative  Airway & Oxygen Therapy: Patient Spontanous Breathing  Post-op Assessment: Report given to RN and Post -op Vital signs reviewed and stable  Post vital signs: Reviewed and stable  Last Vitals:  Vitals Value Taken Time  BP 128/88 07/04/20 0927  Temp    Pulse 76 07/04/20 0927  Resp 19 07/04/20 0927  SpO2 100 % 07/04/20 0927  Vitals shown include unvalidated device data.  Last Pain:  Vitals:   07/04/20 0829  TempSrc:   PainSc: 0-No pain      Patients Stated Pain Goal: 3 (85/27/78 2423)  Complications: No complications documented.

## 2020-07-04 NOTE — Progress Notes (Addendum)
PROGRESS NOTE    GENERAL WEARING  FBP:[id number] DOB: 1960-05-22 DOA: 06/30/2020 PCP: Roy Perna, NP    Chief Complaint  Patient presents with  . Chest Pain  . Shortness of Breath    Brief Narrative:  60 year old male with history of hypertension, CHF, diabetes mellitus type 2, CVA with residual right-sided weakness, depression, remote polysubstance abuse presents with complaints of intermittent chest pressure. Pt was recently admitted for the same reasons between 06/25/20 and 06/28/20, underwent non invasive test, was negative for acute ischemia and discharged.  But presents for recurrent symptoms. Cardiology consulted and he was initially scheduled for cardiac cath on Monday. On 07/02/20 patient had acute left sided blurry vision,  A code stroke activated for further evaluation. Initial CT head without contrast showed Evidence of small vessel ischemia in the left caudate and the left thalamus, with the latter more apparent than on a Head CT 1 week ago. Follow up MRI brain without contrast ordered for further evaluation. Pt couldn't tolerate the procedure and wanted to get it done under general anesthesia.  Mri brain without contrast and MRA head showed showing abnormal signal within the pons and hemispheric deep and subcortical white matter, including the splenium of the corpus callosum. Hemosiderin deposition within the areas of prior pontine insult. Findings could be due entirely to chronic small vessel disease, but the possibility of coexisting demyelinating disease is not excluded. Currently awaiting neurology recommendations .    Assessment & Plan:   Active Problems:   Leukopenia   Diabetes mellitus type 2, uncontrolled (HCC)   Hypertensive urgency   Chest pain   CKD (chronic kidney disease)   TIA (transient ischemic attack)      Atypical Chest pain:  patient has mild elevation of troponin on admission, EKG showed nonspecific T wave abnormality in lateral leads.  CXR does  not show any acute pulm disease.  Echocardiogram shows grade 1 diastolic dysfunction. Moderately elevated pulmonary artery systolic pressure. Cardiology was consulted,  Underwent nuclear stress test on last admission showing  No reversible ischemia. Probable inferior wall diaphragmatic attenuation. Pt presents 2 days later with similar symptoms, cardiology consulted , initially recommended cardiac cath, but in view of his new deficits and code stroke, and asymptomatic from cardiovascular standpoint  he was taken off schedule for cath tomorrow.  Requested GI  for further work up of his atypical chest pain / epigastric pain--> recommended outpatient work up with colonoscopy and EGD.   Meanwhile continue with aspirin , lipitor, coreg and entresto.     Hypertensive Urgency:  Permissive hypertension for the TIA  Pulmonary artery hypertension- echocardiogram shows moderately reduced pulmonary artery pressure. Patient currently not requiring oxygen. Recommend outpatient follow up with pulmonology for surveillance.     Diabetes mellitus type 2, insulin dependent with stage 2 CKD. Uncontrolled with hyperglycemia.  -hemoglobin A1c is 9.1.    CBG (last 3)  Recent Labs    07/04/20 0805 07/04/20 0928 07/04/20 1141  GLUCAP 191* 153* 246*   Increased the lantus to 30 units and continue with SSI.    Leukopenia- improving. Unclear etiology.   Iron deficiency anemia- patient's hemoglobin is 11, serum iron studies from November 2021 showed serum iron of 33 with saturation 13%. He had 2 attempts of colonoscopy November 2021 and December 2021 but patient had inadequate prep. Follow-up as outpatient with EGD and colonoscopy.     Hypoalbuminemia-unclear etiology,  Will need dietary consult and outpatient follow up.   CKD stage II- Creatinine around  1.3 today improved from 1.8,.    History of CVA with residual deficit- patient has known history of CVA in September 2021 which left him with  residual right-sided weakness and mostly wheelchair-bound.   Thyroid nodule- CTA chest shows bilateral thyroid nodules measuring 2.6 cm on the right. TSH 1.714. Will need thyroid ultrasound as outpatient. Follow-up PCP for further work-up.  Mild hyponatremia:  Continue to monitor.    Left visual field defect/ blurry vision:  Resolved as per the patient. Code stroke called yesterday and CT head showed  Evidence of small vessel ischemia in the left caudate and the left thalamus, with the latter more apparent than on a Head CT 1 week ago. Follow up MRI brain without contrast ordered for further evaluation. Pt couldn't tolerate the procedure and wanted to get it done under general anesthesia. MRI brain and MRA head  Reviewed with Dr Leonie Man, recommending no further work up.  Continue with apirin 81 mg daily .  Carotid duplex unremarkable, . LDL in 224, increased her lipitor from 40 mg to 80 mg daily.      DVT prophylaxis: Heparin.  Code Status: full code.  Family Communication: None at bedside.  Disposition:   Status is: Inpatient.   The patient will require care spanning > 2 midnights and should be moved to inpatient because: Ongoing diagnostic testing needed not appropriate for outpatient work up  Dispo: The patient is from: ALF              Anticipated d/c is to: ALF              Patient currently is not medically stable to d/c.   Difficult to place patient No       Consultants:   Cardiology.    Procedures: cardiac cath scheduled on Monday.    Antimicrobials: none.    Subjective: Pt denies any chest pain or sob. No nausea or vomiting.  Objective: Vitals:   07/04/20 0813 07/04/20 0928 07/04/20 0937 07/04/20 1142  BP: (!) 171/105 128/88 134/87 (!) 141/97  Pulse: 79 76 80 75  Resp:  19 17 18   Temp:  98 F (36.7 C)  98.5 F (36.9 C)  TempSrc:    Oral  SpO2:  100%  99%  Weight:      Height:        Intake/Output Summary (Last 24 hours) at 07/04/2020  1445 Last data filed at 07/04/2020 1346 Gross per 24 hour  Intake 1691.95 ml  Output 1400 ml  Net 291.95 ml   Filed Weights   06/30/20 0031  Weight: 115 kg    Examination:  General exam: well developed  gentleman, not in any distress.  Respiratory system : Clear to auscultation bilaterally, no wheezing or rhonchi Cardiovascular system: S1-S2 heard, regular rate rhythm, no JVD no pedal edema Gastrointestinal system: Abdomen is soft, nontender nondistended bowel sounds normal  Central nervous system: Patient is alert and oriented blurry vision has resolved residual left-sided weakness present. Extremities: Pedal edema present skin: No rashes seen Psychiatry: Mood is appropriate    Data Reviewed: I have personally reviewed following labs and imaging studies  CBC: Recent Labs  Lab 06/30/20 0040 07/01/20 0245 07/02/20 0126  WBC 3.5* 3.3* 2.8*  NEUTROABS  --   --  0.8*  HGB 11.5* 11.1* 10.0*  HCT 37.4* 35.5* 32.6*  MCV 81.0 80.3 79.9*  PLT 260 227 081    Basic Metabolic Panel: Recent Labs  Lab 06/28/20 0309 06/30/20 0039 07/01/20 0245  07/02/20 0126 07/03/20 0950  NA 133* 135 133* 132* 133*  K 4.0 3.9 4.0 4.3 4.5  CL 103 106 102 103 106  CO2 27 25 24 25 22   GLUCOSE 214* 170* 202* 228* 179*  BUN 19 17 17 20 20   CREATININE 1.88* 1.52* 1.69* 1.61* 1.31*  CALCIUM 7.9* 8.4* 8.2* 8.0* 8.1*    GFR: Estimated Creatinine Clearance: 75.6 mL/min (A) (by C-G formula based on SCr of 1.31 mg/dL (H)).  Liver Function Tests: Recent Labs  Lab 06/30/20 0039  AST 15  ALT 10  ALKPHOS 76  BILITOT 0.5  PROT 5.7*  ALBUMIN 1.8*    CBG: Recent Labs  Lab 07/03/20 2008 07/04/20 0738 07/04/20 0805 07/04/20 0928 07/04/20 1141  GLUCAP 248* 197* 191* 153* 246*     Recent Results (from the past 240 hour(s))  Resp Panel by RT-PCR (Flu A&B, Covid) Nasopharyngeal Swab     Status: None   Collection Time: 06/25/20  8:32 AM   Specimen: Nasopharyngeal Swab;  Nasopharyngeal(NP) swabs in vial transport medium  Result Value Ref Range Status   SARS Coronavirus 2 by RT PCR NEGATIVE NEGATIVE Final    Comment: (NOTE) SARS-CoV-2 target nucleic acids are NOT DETECTED.  The SARS-CoV-2 RNA is generally detectable in upper respiratory specimens during the acute phase of infection. The lowest concentration of SARS-CoV-2 viral copies this assay can detect is 138 copies/mL. A negative result does not preclude SARS-Cov-2 infection and should not be used as the sole basis for treatment or other patient management decisions. A negative result may occur with  improper specimen collection/handling, submission of specimen other than nasopharyngeal swab, presence of viral mutation(s) within the areas targeted by this assay, and inadequate number of viral copies(<138 copies/mL). A negative result must be combined with clinical observations, patient history, and epidemiological information. The expected result is Negative.  Fact Sheet for Patients:  EntrepreneurPulse.com.au  Fact Sheet for Healthcare Providers:  IncredibleEmployment.be  This test is no t yet approved or cleared by the [place] FDA and  has been authorized for detection and/or diagnosis of SARS-CoV-2 by FDA under an Emergency Use Authorization (EUA). This EUA will remain  in effect (meaning this test can be used) for the duration of the COVID-19 declaration under Section 564(b)(1) of the Act, 21 U.S.C.section 360bbb-3(b)(1), unless the authorization is terminated  or revoked sooner.       Influenza A by PCR NEGATIVE NEGATIVE Final   Influenza B by PCR NEGATIVE NEGATIVE Final    Comment: (NOTE) The Xpert Xpress SARS-CoV-2/FLU/RSV plus assay is intended as an aid in the diagnosis of influenza from Nasopharyngeal swab specimens and should not be used as a sole basis for treatment. Nasal washings and aspirates are unacceptable for Xpert Xpress  SARS-CoV-2/FLU/RSV testing.  Fact Sheet for Patients: EntrepreneurPulse.com.au  Fact Sheet for Healthcare Providers: IncredibleEmployment.be  This test is not yet approved or cleared by the [place] FDA and has been authorized for detection and/or diagnosis of SARS-CoV-2 by FDA under an Emergency Use Authorization (EUA). This EUA will remain in effect (meaning this test can be used) for the duration of the COVID-19 declaration under Section 564(b)(1) of the Act, 21 U.S.C. section 360bbb-3(b)(1), unless the authorization is terminated or revoked.  Performed at [facility], [place] [address], [place], [place] [address]   SARS CORONAVIRUS 2 (TAT 6-24 HRS) Nasopharyngeal Nasopharyngeal Swab     Status: None   Collection Time: 06/30/20 12:47 PM   Specimen: Nasopharyngeal Swab  Result Value Ref Range Status   SARS Coronavirus 2 NEGATIVE NEGATIVE Final    Comment: (NOTE) SARS-CoV-2 target nucleic acids are NOT DETECTED.  The SARS-CoV-2 RNA is generally detectable in upper and lower respiratory specimens during the acute phase of infection. Negative results do not preclude SARS-CoV-2 infection, do not rule out co-infections with other pathogens, and should not be used as the sole basis for treatment or other patient management decisions. Negative results must be combined with clinical observations, patient history, and epidemiological information. The expected result is Negative.  Fact Sheet for Patients: SugarRoll.be  Fact Sheet for Healthcare Providers: https://www.woods-mathews.com/  This test is not yet approved or cleared by the [place] FDA and  has been authorized for detection and/or diagnosis of SARS-CoV-2 by FDA under an Emergency Use Authorization (EUA). This EUA will remain  in effect (meaning this test can be used) for the duration of the COVID-19 declaration under Se ction  564(b)(1) of the Act, 21 U.S.C. section 360bbb-3(b)(1), unless the authorization is terminated or revoked sooner.  Performed at [facility], [place] [address], [place], [place] [address]          Radiology Studies: MR ANGIO HEAD WO CONTRAST  Result Date: 07/04/2020 CLINICAL DATA:  Transient ischemic attack.  Right-sided weakness. EXAM: MRI HEAD WITHOUT CONTRAST MRA HEAD WITHOUT CONTRAST TECHNIQUE: Multiplanar, multi-echo pulse sequences of the brain and surrounding structures were acquired without intravenous contrast. Angiographic images of the Circle of Willis were acquired using MRA technique without intravenous contrast. COMPARISON: No pertinent prior exam. COMPARISON:  Head CT 07/02/2020 FINDINGS: MRI HEAD FINDINGS Brain: Chronic small vessel ischemic type changes of the pons, with hemosiderin deposition. No sign of acute infarction in that location. No focal cerebellar finding. Cerebral hemispheres show extensive abnormal T2 and FLAIR signal within the cerebral hemispheric deep white matter and splenium of the corpus callosum. The pattern could represent small vessel disease, demyelinating disease or a combination of both. No cortical or large vessel territory infarction. Diffusion imaging suggests minimal restricted diffusion in the right splenium of the corpus callosum and right frontal deep white matter, but these could possibly be areas of T2 shine through. No mass lesion, acute hemorrhage, hydrocephalus or extra-axial collection. Vascular: Major vessels at the base of the brain show flow. Skull and upper cervical spine: Negative Sinuses/Orbits: Clear/normal Other: None MRA HEAD FINDINGS Anterior circulation: Both internal carotid arteries are widely patent through the skull base and siphon regions. The anterior and middle cerebral vessels are patent. No large or medium vessel occlusion. No apparent correctable proximal stenosis. No aneurysm or vascular malformation. Posterior  circulation: Left vertebral artery terminates in PICA. Right vertebral artery is widely patent to the basilar. No basilar stenosis. Posterior circulation branch vessels appear normal. Right PCA receives most of it supply from the anterior circulation. Anatomic variants: None other significant. IMPRESSION: No intracranial large or medium vessel occlusion or correctable proximal stenosis identified. Abnormal brain MRI showing abnormal signal within the pons and hemispheric deep and subcortical white matter, including the splenium of the corpus callosum. Hemosiderin deposition within the areas of prior pontine insult. Findings could be due entirely to chronic small vessel disease, but the possibility of coexisting demyelinating disease is not excluded. Additionally, diffusion imaging suggests low level restricted diffusion in the splenium of the corpus callosum and right frontal white matter, though this could possibly be T2 shine through. It does raise the possibility of either subacute ischemic changes or active demyelinating disease. Electronically Signed  By: Nelson Chimes M.D.   On: 07/04/2020 09:40   MR BRAIN WO CONTRAST  Result Date: 07/04/2020 CLINICAL DATA:  Transient ischemic attack.  Right-sided weakness. EXAM: MRI HEAD WITHOUT CONTRAST MRA HEAD WITHOUT CONTRAST TECHNIQUE: Multiplanar, multi-echo pulse sequences of the brain and surrounding structures were acquired without intravenous contrast. Angiographic images of the Circle of Willis were acquired using MRA technique without intravenous contrast. COMPARISON: No pertinent prior exam. COMPARISON:  Head CT 07/02/2020 FINDINGS: MRI HEAD FINDINGS Brain: Chronic small vessel ischemic type changes of the pons, with hemosiderin deposition. No sign of acute infarction in that location. No focal cerebellar finding. Cerebral hemispheres show extensive abnormal T2 and FLAIR signal within the cerebral hemispheric deep white matter and splenium of the corpus  callosum. The pattern could represent small vessel disease, demyelinating disease or a combination of both. No cortical or large vessel territory infarction. Diffusion imaging suggests minimal restricted diffusion in the right splenium of the corpus callosum and right frontal deep white matter, but these could possibly be areas of T2 shine through. No mass lesion, acute hemorrhage, hydrocephalus or extra-axial collection. Vascular: Major vessels at the base of the brain show flow. Skull and upper cervical spine: Negative Sinuses/Orbits: Clear/normal Other: None MRA HEAD FINDINGS Anterior circulation: Both internal carotid arteries are widely patent through the skull base and siphon regions. The anterior and middle cerebral vessels are patent. No large or medium vessel occlusion. No apparent correctable proximal stenosis. No aneurysm or vascular malformation. Posterior circulation: Left vertebral artery terminates in PICA. Right vertebral artery is widely patent to the basilar. No basilar stenosis. Posterior circulation branch vessels appear normal. Right PCA receives most of it supply from the anterior circulation. Anatomic variants: None other significant. IMPRESSION: No intracranial large or medium vessel occlusion or correctable proximal stenosis identified. Abnormal brain MRI showing abnormal signal within the pons and hemispheric deep and subcortical white matter, including the splenium of the corpus callosum. Hemosiderin deposition within the areas of prior pontine insult. Findings could be due entirely to chronic small vessel disease, but the possibility of coexisting demyelinating disease is not excluded. Additionally, diffusion imaging suggests low level restricted diffusion in the splenium of the corpus callosum and right frontal white matter, though this could possibly be T2 shine through. It does raise the possibility of either subacute ischemic changes or active demyelinating disease. Electronically  Signed   By: Nelson Chimes M.D.   On: 07/04/2020 09:40   VAS US CAROTID  Result Date: 07/03/2020 Carotid Arterial Duplex Study Patient Name:  [name]  Date of Exam:   07/03/2020 Medical Rec #: [id number]      Accession #:    [id number] Date of Birth: [birth year]       Patient Gender: M Patient Age:   66Y Exam Location:  [facility] Procedure:      VAS US CAROTID Referring Phys: [id number] Hosie Poisson --------------------------------------------------------------------------------  Indications:       TIA and Blurry vision of the left eye. Risk Factors:      Hypertension, Diabetes, prior CVA. Other Factors:     History of Covid 19 03/08/20, CHF, remote history of                    polysubstance abuse. Comparison Study:  No prior study on file Performing Technologist: Sharion Dove RVS  Examination Guidelines: A complete evaluation includes B-mode imaging, spectral Doppler, color Doppler, and power Doppler as needed of all accessible portions of each vessel.  Bilateral testing is considered an integral part of a complete examination. Limited examinations for reoccurring indications may be performed as noted.  Right Carotid Findings: +----------+--------+--------+--------+------------------+------------------+           PSV cm/sEDV cm/sStenosisPlaque DescriptionComments           +----------+--------+--------+--------+------------------+------------------+ CCA Prox  75      19                                intimal thickening +----------+--------+--------+--------+------------------+------------------+ CCA Distal63      19                                intimal thickening +----------+--------+--------+--------+------------------+------------------+ ICA Prox  61      23              heterogenous                         +----------+--------+--------+--------+------------------+------------------+ ICA Distal72      22                                                    +----------+--------+--------+--------+------------------+------------------+ ECA       98      19                                                   +----------+--------+--------+--------+------------------+------------------+ +----------+--------+-------+--------+-------------------+           PSV cm/sEDV cmsDescribeArm Pressure (mmHG) +----------+--------+-------+--------+-------------------+ [id number]                                         +----------+--------+-------+--------+-------------------+ +---------+--------+--+--------+--+ VertebralPSV cm/s60EDV cm/s18 +---------+--------+--+--------+--+  Left Carotid Findings: +----------+--------+--------+--------+------------------+------------------+           PSV cm/sEDV cm/sStenosisPlaque DescriptionComments           +----------+--------+--------+--------+------------------+------------------+ CCA Prox  73      17                                intimal thickening +----------+--------+--------+--------+------------------+------------------+ CCA Distal66      19                                intimal thickening +----------+--------+--------+--------+------------------+------------------+ ICA Prox  68      24              heterogenous                         +----------+--------+--------+--------+------------------+------------------+ ICA Distal58      22                                                   +----------+--------+--------+--------+------------------+------------------+ ECA  92      12                                                   +----------+--------+--------+--------+------------------+------------------+ +----------+--------+--------+--------+-------------------+           PSV cm/sEDV cm/sDescribeArm Pressure (mmHG) +----------+--------+--------+--------+-------------------+ [id number]                                           +----------+--------+--------+--------+-------------------+ +---------+--------+--+--------+-+ VertebralPSV cm/s40EDV cm/s9 +---------+--------+--+--------+-+   Summary: Right Carotid: The extracranial vessels were near-normal with only minimal wall                thickening or plaque. Left Carotid: The extracranial vessels were near-normal with only minimal wall               thickening or plaque. Vertebrals:  Bilateral vertebral arteries demonstrate antegrade flow. Subclavians: Normal flow hemodynamics were seen in bilateral subclavian              arteries. *See table(s) above for measurements and observations.  Electronically signed by Antony Contras MD on 07/03/2020 at 12:37:25 PM.    Final         Scheduled Meds: . aspirin  81 mg Oral Daily  . atorvastatin  80 mg Oral QHS  . carvedilol  12.5 mg Oral BID WC  . heparin  5,000 Units Subcutaneous Q8H  . insulin aspart  0-5 Units Subcutaneous QHS  . insulin aspart  0-9 Units Subcutaneous TID WC  . insulin glargine  30 Units Subcutaneous QHS  . pantoprazole  40 mg Oral Daily  . polyethylene glycol  17 g Oral BID  . pregabalin  75 mg Oral BID  . sacubitril-valsartan  1 tablet Oral BID  . senna-docusate  1 tablet Oral BID  . sertraline  50 mg Oral Daily  . tamsulosin  0.8 mg Oral Daily   Continuous Infusions:    LOS: 1 day        Hosie Poisson, MD [facility]   To contact the attending provider between 7A-7P or the covering provider during after hours 7P-7A, please log into the web site [email or web address] and access using universal [place] password for that web site. If you do not have the password, please call the hospital operator.  07/04/2020, 2:45 PM

## 2020-07-04 NOTE — Progress Notes (Signed)
Inpatient Diabetes Program Recommendations  AACE/ADA: New Consensus Statement on Inpatient Glycemic Control (2015)  Target Ranges:  Prepandial:   less than 140 mg/dL      Peak postprandial:   less than 180 mg/dL (1-2 hours)      Critically ill patients:  140 - 180 mg/dL   Lab Results  Component Value Date   GLUCAP 246 (H) 07/04/2020   HGBA1C 9.1 (H) 06/25/2020    Review of Glycemic Control Results for NEIL, BRICKELL (MRN [id number]) as of 07/04/2020 12:47  Ref. Range 07/03/2020 07:43 07/03/2020 12:39 07/03/2020 17:11 07/03/2020 20:08 07/04/2020 07:38 07/04/2020 08:05 07/04/2020 09:28 07/04/2020 11:41  Glucose-Capillary Latest Ref Range: 70 - 99 mg/dL 266 (H) 134 (H) 182 (H) 248 (H) 197 (H) 191 (H) 153 (H) 246 (H)   Diabetes history: DM 2 Outpatient Diabetes medications: Basaglar 20 units qhs, Novolog 2-12 units tid Current orders for Inpatient glycemic control:  Lantus 25 units Novolog 0-9 units tid + hs  Inpatient Diabetes Program Recommendations:    Note: Decadron 5 mg given during surgery  -  Increase Lantus to 30 units -  Increase Novolog to 0-15 units tid + hs -  Add Novolog 2 units tid meal coverage if >50% of meals  Will titrate insulin within 48 hours when decadron clears.  Thanks,  Tama Headings RN, MSN, BC-ADM Inpatient Diabetes Coordinator Team Pager [phone] (8a-5p)

## 2020-07-04 NOTE — Anesthesia Postprocedure Evaluation (Signed)
Anesthesia Post Note  Patient: [name]  Procedure(s) Performed: MRI WITH ANESTHESIA BRAIN WITHOUT CONTRAST AND HEAD WITHOUT CONTRAST (N/A )     Patient location during evaluation: PACU Anesthesia Type: General Level of consciousness: awake and alert Pain management: pain level controlled Vital Signs Assessment: post-procedure vital signs reviewed and stable Respiratory status: spontaneous breathing, nonlabored ventilation, respiratory function stable and patient connected to nasal cannula oxygen Cardiovascular status: blood pressure returned to baseline and stable Postop Assessment: no apparent nausea or vomiting Anesthetic complications: no   No complications documented.  Last Vitals:  Vitals:   07/04/20 0928 07/04/20 0937  BP: 128/88 134/87  Pulse: 76 80  Resp: 19 17  Temp: 36.7 C   SpO2: 100%     Last Pain:  Vitals:   07/04/20 0928  TempSrc:   PainSc: 0-No pain                 Daine Gunther L Dmarco Baldus

## 2020-07-05 ENCOUNTER — Encounter (HOSPITAL_COMMUNITY): Payer: Self-pay | Admitting: Radiology

## 2020-07-05 DIAGNOSIS — N182 Chronic kidney disease, stage 2 (mild): Secondary | ICD-10-CM | POA: Diagnosis not present

## 2020-07-05 DIAGNOSIS — E1165 Type 2 diabetes mellitus with hyperglycemia: Secondary | ICD-10-CM | POA: Diagnosis not present

## 2020-07-05 DIAGNOSIS — R079 Chest pain, unspecified: Secondary | ICD-10-CM | POA: Diagnosis not present

## 2020-07-05 DIAGNOSIS — D72819 Decreased white blood cell count, unspecified: Secondary | ICD-10-CM | POA: Diagnosis not present

## 2020-07-05 DIAGNOSIS — I160 Hypertensive urgency: Secondary | ICD-10-CM | POA: Diagnosis not present

## 2020-07-05 LAB — GLUCOSE, CAPILLARY
Glucose-Capillary: 311 mg/dL — ABNORMAL HIGH (ref 70–99)
Glucose-Capillary: 191 mg/dL — ABNORMAL HIGH (ref 70–99)
Glucose-Capillary: 183 mg/dL — ABNORMAL HIGH (ref 70–99)
Glucose-Capillary: 166 mg/dL — ABNORMAL HIGH (ref 70–99)

## 2020-07-05 MED ORDER — PREGABALIN 75 MG PO CAPS: 75.0000 mg | ORAL_CAPSULE | Freq: Two times a day (BID) | ORAL | 0 refills | Status: DC

## 2020-07-05 MED ORDER — BASAGLAR KWIKPEN 100 UNIT/ML ~~LOC~~ SOPN
30.0000 [IU] | PEN_INJECTOR | Freq: Every day | SUBCUTANEOUS | 0 refills | Status: DC
Start: 2020-07-05 — End: 2020-09-07

## 2020-07-05 MED ORDER — CARVEDILOL 12.5 MG PO TABS: 12.5000 mg | ORAL_TABLET | Freq: Two times a day (BID) | ORAL | 0 refills | Status: DC

## 2020-07-05 MED ORDER — AMLODIPINE BESYLATE 10 MG PO TABS: 10.0000 mg | ORAL_TABLET | Freq: Every day | ORAL | 0 refills | Status: DC

## 2020-07-05 MED ORDER — ASPIRIN 81 MG PO CHEW: 81.0000 mg | CHEWABLE_TABLET | Freq: Every day | ORAL | 0 refills | Status: AC

## 2020-07-05 MED ORDER — TAMSULOSIN HCL 0.4 MG PO CAPS: 0.8000 mg | ORAL_CAPSULE | Freq: Every day | ORAL | 0 refills | Status: AC

## 2020-07-05 MED ORDER — PANTOPRAZOLE SODIUM 40 MG PO TBEC: 40.0000 mg | DELAYED_RELEASE_TABLET | Freq: Every day | ORAL | 0 refills | Status: DC

## 2020-07-05 MED ORDER — FUROSEMIDE 80 MG PO TABS: 40.0000 mg | ORAL_TABLET | Freq: Every day | ORAL | 0 refills | Status: DC

## 2020-07-05 MED ORDER — ATORVASTATIN CALCIUM 80 MG PO TABS: 80.0000 mg | ORAL_TABLET | Freq: Every day | ORAL | 0 refills | Status: DC

## 2020-07-05 MED ORDER — SACUBITRIL-VALSARTAN 49-51 MG PO TABS: 1.0000 | ORAL_TABLET | Freq: Two times a day (BID) | ORAL | 0 refills | Status: AC

## 2020-07-05 MED ORDER — SERTRALINE HCL 50 MG PO TABS: 50.0000 mg | ORAL_TABLET | Freq: Every day | ORAL | 0 refills | Status: DC

## 2020-07-05 NOTE — Plan of Care (Signed)

## 2020-07-05 NOTE — Consult Note (Addendum)
   [facility] CM Inpatient Consult   07/05/2020  PRUDENCIO VELAZCO Jun 15, 1960 [id number]   [facility] [ACO] Patient: Government social research officer  Primary Care Provider:  [facility],  [place], [place].  Patient screened for hospitalization with noted extreme high risk score for unplanned readmission risk and  to assess for potential [place] Management service needs for post hospital transition.  Review of patient's medical record reveals patient is with uncontrolled diabetes.  Came to speak with patient regarding post hospital follow up needs.  Home health will be provided on site staff at [facility] per inpatient [facility] RNCM.  Patient was getting dressed for transitioning back to [facility].    Plan: Will have patient assigned to a [place] Management Coordinator for telephonic support for complex disease management. Addendum: A brochure and 24 hr nurse line magnet left with patient's discharge instruction papers with the CNA getting him dressed for transition. For questions contact:   Natividad Brood, RN BSN [facility] Liaison  [phone] business mobile phone Toll free office [phone]  Fax number: [phone] [email or web address]

## 2020-07-05 NOTE — Discharge Instructions (Signed)
Recommendations for Outpatient Follow-up:  1. Follow up with PCP in 1-2 weeks 2. Outpatient follow-up with cardiology, Dr. Einar Gip 3-4 weeks 3. Outpatient follow-up with gastroenterology for EGD/colonoscopy 4. Outpatient follow-up with neurology, stroke clinic 6 weeks 5. Will need further work-up of thyroid nodule with ultrasound outpatient. 6. Increased Lantus to 30 units subcutaneously daily.  Needs further surveillance/monitoring of glucose for tighter control with elevated hemoglobin A1c of 9.1 7. MiraLAX daily to twice daily for constipation 8. Please obtain BMP/CBC in one week  Home Health: Home health PT Equipment/Devices: Rolling walker   History of present illness:  [name] is a 60 year old male with past medical history significant for essential hypertension, chronic diastolic congestive heart failure, type 2 diabetes mellitus, history of CVA with residual right-sided weakness, depression, remote polysubstance abuse who presented to the ED with intermittent chest pressure.  Recently admitted for similar complaints from 5 1-5/4 in which she underwent noninvasive testing that was negative for acute ischemia and subsequent was discharged.  Cardiology was consulted.  Hospital service consulted for further evaluation management.   Hospital course:  Atypical chest pain Patient presenting to the ED with recurrent chest discomfort.  Was noted to have mild elevation of troponin with nonspecific T wave abnormalities in lateral leads on EKG.  Chest x-ray without acute cardiopulmonary disease process.  TTE with grade 1 diastolic dysfunction with moderately elevated pulmonary artery systolic pressure.  Cardiology was consulted and followed during hospital course.  Patient recently underwent nuclear medicine stress test with no reversible ischemia.  Cardiology does not recommend any further inpatient testing and recommends to continue aspirin, atorvastatin, carvedilol, Entresto.   Outpatient follow-up with cardiology in 3-4 weeks.  Visual deficit: Resolved During hospitalization, patient complained of blurry vision which self resolved.  Subsequent code stroke was initiated with CT head with evidence of small vessel ischemia left caudate and left thalamus.  MR brain with abnormal signal pons/hemispheric deep/subcortical white matter, including the splenium of the corpus callosum hemosiderin deposit [name] within the prior pontine insult consistent with coexisting chronic small vessel disease versus demyelinating disease versus subacute ischemic changes.  MR angio head/neck with no large or medium vessel occlusion.  Hemoglobin A1c 9.1.  LDL 224.  TTE with LVEF 60 to 78%, grade 1 diastolic dysfunction, small patent foramen ovale.  Carotid duplex ultrasound with no significant carotid artery stenosis.  Patient was seen by neurology, given no acute infarct noted recommendation to continue aspirin for secondary stroke prevention.  We will also need further aggressive management in regards to his poorly controlled diabetes.  Increased atorvastatin to 80 mg p.o. daily.  Outpatient follow-up in stroke clinic 6 weeks.  Essential hypertension Amlodipine 10 mg p.o. daily, carvedilol 12.5 mg p.o. twice daily, Entresto 49-51mg  PO BID,.  Continue aspirin and statin.  Type 2 diabetes mellitus, uncontrolled with hyperglycemia Hemoglobin A1c 9.1, poorly controlled.  Increase Lantus to 30 units subcutaneously daily.  Continue insulin sliding scale for further coverage.  Will need close follow-up with PCP for further adjustments.  CKD stage II Creatinine 1.3, stable.  Leukopenia Iron deficiency anemia Consider outpatient referral to hematology for further evaluation.  Outpatient follow-up with gastroenterology for EGD/colonoscopy.  History of CVA with residual right-sided deficit Previous CVA September 2021.  Seen by PT Ortho recommendations of home health PT.  Bilateral Thyroid  nodule Incidental finding of bilateral thyroid nodules, largest 2.6 cm right thyroid nodule on CTA.  TSH 1.714.  Will need thyroid ultrasound outpatient.  Outpatient follow-up with PCP.

## 2020-07-05 NOTE — Evaluation (Signed)
Occupational Therapy Evaluation Patient Details Name: [name] MRN: [id number] DOB: Sep 22, 1960 Today's Date: 07/05/2020    History of Present Illness Pt is a 60 y.o. male recently admitted 06/25/20 with SOB/chest pain secondary to hypertensive urgency, stress test 5/3 with no significant ischemia, now readmitted from ALF on 06/30/20 with chest pain and vomiting. Rapid response called 5/8 with pt c/o blurred vision; per neuro, etiology for transient symptoms likely 2/2 to transient cerebral hypoperfusion from lowered BP following NTG tx of CP. Head CT with ill-defined ischemia in L caudate and thalamus; MRI shows no acute injury. PMH includes CVA (residual R-side weakness), HTN, DM, CHF, arthritis, bipolar affective, depression.   Clinical Impression   Pt admitted to ED for concerns listed above. PTA pt is living at a ALF, using an electric WC to ambulate and a SPC for transfers. Pt recieves assistance with bathing, otherwise is independent with ADL's. Pt reports that he is very interested in walking again and regaining his independence. Throughout evaluation, pt demonstrated min guard with functional mobility and most ADL's. Pt still requires assistance with bathing for pericare due to balance. OT educated pt on fall prevention and RW safety, to which pt was agreeable. Pt is reported to DC home today, however if he does not, OT will continue to follow up acutely to address established goals.     Follow Up Recommendations  Home health OT;Supervision - Intermittent    Equipment Recommendations  None recommended by OT    Recommendations for Other Services       Precautions / Restrictions Precautions Precautions: Fall;Other (comment) Precaution Comments: H/o CVA (residual R-side weakness) Restrictions Weight Bearing Restrictions: No      Mobility Bed Mobility Overal bed mobility: Needs Assistance Bed Mobility: Supine to Sit     Supine to sit: Min assist     General bed mobility  comments: recieved in recliner    Transfers Overall transfer level: Needs assistance Equipment used: Rolling walker (2 wheeled) Transfers: Sit to/from Stand Sit to Stand: Min guard         General transfer comment: Heavy reliance on RW, pt able to stand with no physical assistance.    Balance Overall balance assessment: Needs assistance Sitting-balance support: Feet supported Sitting balance-Leahy Scale: Good     Standing balance support: Bilateral upper extremity supported;During functional activity Standing balance-Leahy Scale: Poor Standing balance comment: Reliant on UE support                           ADL either performed or assessed with clinical judgement   ADL Overall ADL's : Needs assistance/impaired Eating/Feeding: Independent;Sitting Eating/Feeding Details (indicate cue type and reason): Able to open all lids and containers with lunch tray and feed himself sitting in recliner Grooming: Wash/dry face;Wash/dry hands;Oral care;Sitting;Set up Grooming Details (indicate cue type and reason): Once set up pt able complete grooming seated in recliner Upper Body Bathing: Set up;Sitting Upper Body Bathing Details (indicate cue type and reason): Able to complete while seated Lower Body Bathing: Moderate assistance;Sitting/lateral leans;Sit to/from stand Lower Body Bathing Details (indicate cue type and reason): Needs assistance with pericare and back side Upper Body Dressing : Set up;Sitting Upper Body Dressing Details (indicate cue type and reason): can don shirt seated Lower Body Dressing: Min guard;Sitting/lateral leans;Sit to/from stand Lower Body Dressing Details (indicate cue type and reason): with extra time pt is able to don and doff socks, shoes, and pants Toilet Transfer: Min Landscape architect  Transfer Details (indicate cue type and reason): Pt able to complete stand pivot transfer with no physical assist. Toileting- Clothing Manipulation  and Hygiene: Minimal assistance;Sitting/lateral lean;Sit to/from stand Toileting - Clothing Manipulation Details (indicate cue type and reason): Pt needs assistance with pericare Tub/ Shower Transfer: Min Engineer, structural Details (indicate cue type and reason): Pt able to complete stand pivot with no pysical assist. min gurad for safety. Functional mobility during ADLs: Min guard;Rolling walker General ADL Comments: Pt very motivated to ambulate and work on Hotel manager. able to stand with RW and take a few steps with min guard for safety. Can complete most ADL's with set up while seated.     Vision Baseline Vision/History: No visual deficits Patient Visual Report: No change from baseline Vision Assessment?: No apparent visual deficits     Perception Perception Perception Tested?: No   Praxis Praxis Praxis tested?: Not tested    Pertinent Vitals/Pain Pain Assessment: No/denies pain Faces Pain Scale: Hurts a little bit Pain Location: Abdomen Pain Descriptors / Indicators: Guarding Pain Intervention(s): Monitored during session     Hand Dominance Right   Extremity/Trunk Assessment Upper Extremity Assessment Upper Extremity Assessment: Overall WFL for tasks assessed;RUE deficits/detail RUE Deficits / Details: Residual weakness from past stroke in 2021 RUE Coordination: decreased fine motor   Lower Extremity Assessment Lower Extremity Assessment: Defer to PT evaluation   Cervical / Trunk Assessment Cervical / Trunk Assessment: Normal   Communication Communication Communication: No difficulties   Cognition Arousal/Alertness: Awake/alert Behavior During Therapy: WFL for tasks assessed/performed Overall Cognitive Status: Within Functional Limits for tasks assessed                                 General Comments: WFL for simple tasks; pleasant and agreeable; some decreased insight into medical status, suspect near baseline cognition    General Comments  VSS on RA. Educated on RW safety and fall prevention    Exercises     Shoulder Instructions      Home Living Family/patient expects to be discharged to:: Assisted living                             Home Equipment: Walker - 4 wheels;Cane - single point;Wheelchair - power;Shower seat   Additional Comments: Resides at [facility] ALF. Accessible room with walk-in shower      Prior Functioning/Environment Level of Independence: Needs assistance  Gait / Transfers Assistance Needed: Reports mod indep with transfers to/from power w/c, typically using SPC, but needs a RW ADL's / Homemaking Assistance Needed: Reports mod indep with ADLs, except needs assist from staff for bathing; able to transfer himself from w/c to shower bench            OT Problem List: Decreased strength;Decreased range of motion;Decreased activity tolerance;Impaired balance (sitting and/or standing);Decreased coordination;Decreased safety awareness;Decreased knowledge of use of DME or AE      OT Treatment/Interventions: Self-care/ADL training;Therapeutic exercise;Energy conservation;DME and/or AE instruction;Therapeutic activities;Cognitive remediation/compensation;Patient/family education;Balance training    OT Goals(Current goals can be found in the care plan section) Acute Rehab OT Goals Patient Stated Goal: To start walking more and to become more independent OT Goal Formulation: With patient Time For Goal Achievement: 07/19/20 Potential to Achieve Goals: Good ADL Goals Pt Will Perform Grooming: with modified independence;sitting Pt Will Perform Lower Body Dressing: with set-up;sitting/lateral leans;sit to/from stand Pt Will Transfer to  Toilet: with min guard assist;ambulating Pt Will Perform Toileting - Clothing Manipulation and hygiene: with supervision;sitting/lateral leans;sit to/from stand  OT Frequency: Min 2X/week   Barriers to D/C:            Co-evaluation               AM-PAC OT "6 Clicks" Daily Activity     Outcome Measure Help from another person eating meals?: None Help from another person taking care of personal grooming?: A Little Help from another person toileting, which includes using toliet, bedpan, or urinal?: A Little Help from another person bathing (including washing, rinsing, drying)?: A Lot Help from another person to put on and taking off regular upper body clothing?: A Little Help from another person to put on and taking off regular lower body clothing?: A Little 6 Click Score: 18   End of Session Equipment Utilized During Treatment: Gait belt;Rolling walker Nurse Communication: Mobility status  Activity Tolerance: Patient tolerated treatment well Patient left: in chair;with call bell/phone within reach;with chair alarm set  OT Visit Diagnosis: Unsteadiness on feet (R26.81);Muscle weakness (generalized) (M62.81);Other abnormalities of gait and mobility (R26.89)                Time: 6116-4353 OT Time Calculation (min): 18 min Charges:  OT General Charges $OT Visit: 1 Visit OT Evaluation $OT Eval Moderate Complexity: 1 Mod  Roy Lisanti H., OTR/L Acute Rehabilitation  [name] 07/05/2020, 12:51 PM

## 2020-07-05 NOTE — Progress Notes (Signed)
All discharge information provided to Nurse at patient's ALF, [facility] which include medication, prescription medications and follow-up. Patient requested to be picked up by brother to return to ALF at discharge. All belongings returned to patient. No further question ask. Patient discharged.

## 2020-07-05 NOTE — Discharge Summary (Signed)
Physician Discharge Summary  EVEREST BROD BJY:[id number] DOB: January 23, 1961 DOA: 06/30/2020  PCP: [name], NP  Admit date: 06/30/2020 Discharge date: 07/05/2020  Admitted From: [facility] Disposition:  [facility]  Recommendations for Outpatient Follow-up:  1. Follow up with PCP in 1-2 weeks 2. Outpatient follow-up with cardiology, Dr. Einar Gip 3-4 weeks 3. Outpatient follow-up with gastroenterology for EGD/colonoscopy 4. Outpatient follow-up with neurology, stroke clinic 6 weeks 5. Will need further work-up of thyroid nodule with ultrasound outpatient. 6. Increased Lantus to 30 units subcutaneously daily.  Needs further surveillance/monitoring of glucose for tighter control with elevated hemoglobin A1c of 9.1 7. MiraLAX daily to twice daily for constipation 8. Please obtain BMP/CBC in one week  Home Health: Home health PT Equipment/Devices: Rolling walker  Discharge Condition: Stable CODE STATUS: Full code Diet recommendation: Heart healthy/consistent carbohydrate diet  History of present illness:  [name] is a 60 year old male with past medical history significant for essential hypertension, chronic diastolic congestive heart failure, type 2 diabetes mellitus, history of CVA with residual right-sided weakness, depression, remote polysubstance abuse who presented to the ED with intermittent chest pressure.  Recently admitted for similar complaints from 5 1-5/4 in which she underwent noninvasive testing that was negative for acute ischemia and subsequent was discharged.  Cardiology was consulted.  Hospital service consulted for further evaluation management.   Hospital course:  Atypical chest pain Patient presenting to the ED with recurrent chest discomfort.  Was noted to have mild elevation of troponin with nonspecific T wave abnormalities in lateral leads on EKG.  Chest x-ray without acute cardiopulmonary disease process.  TTE with grade 1 diastolic dysfunction with  moderately elevated pulmonary artery systolic pressure.  Cardiology was consulted and followed during hospital course.  Patient recently underwent nuclear medicine stress test with no reversible ischemia.  Cardiology does not recommend any further inpatient testing and recommends to continue aspirin, atorvastatin, carvedilol, Entresto.  Outpatient follow-up with cardiology in 3-4 weeks.  Visual deficit: Resolved During hospitalization, patient complained of blurry vision which self resolved.  Subsequent code stroke was initiated with CT head with evidence of small vessel ischemia left caudate and left thalamus.  MR brain with abnormal signal pons/hemispheric deep/subcortical white matter, including the splenium of the corpus callosum hemosiderin deposit [name] within the prior pontine insult consistent with coexisting chronic small vessel disease versus demyelinating disease versus subacute ischemic changes.  MR angio head/neck with no large or medium vessel occlusion.  Hemoglobin A1c 9.1.  LDL 224.  TTE with LVEF 60 to 08%, grade 1 diastolic dysfunction, small patent foramen ovale.  Carotid duplex ultrasound with no significant carotid artery stenosis.  Patient was seen by neurology, given no acute infarct noted recommendation to continue aspirin for secondary stroke prevention.  We will also need further aggressive management in regards to his poorly controlled diabetes.  Increased atorvastatin to 80 mg p.o. daily.  Outpatient follow-up in stroke clinic 6 weeks.  Essential hypertension Amlodipine 10 mg p.o. daily, carvedilol 12.5 mg p.o. twice daily, Entresto 49-51mg  PO BID,.  Continue aspirin and statin.  Type 2 diabetes mellitus, uncontrolled with hyperglycemia Hemoglobin A1c 9.1, poorly controlled.  Increase Lantus to 30 units subcutaneously daily.  Continue insulin sliding scale for further coverage.  Will need close follow-up with PCP for further adjustments.  CKD stage II Creatinine 1.3,  stable.  Leukopenia Iron deficiency anemia Consider outpatient referral to hematology for further evaluation.  Outpatient follow-up with gastroenterology for EGD/colonoscopy.  History of CVA with  residual right-sided deficit Previous CVA September 2021.  Seen by PT Ortho recommendations of home health PT.  Bilateral Thyroid nodule Incidental finding of bilateral thyroid nodules, largest 2.6 cm right thyroid nodule on CTA.  TSH 1.714.  Will need thyroid ultrasound outpatient.  Outpatient follow-up with PCP.    Discharge Diagnoses:  Active Problems:   Leukopenia   Diabetes mellitus type 2, uncontrolled (HCC)   CKD (chronic kidney disease)   TIA (transient ischemic attack)    Discharge Instructions  Discharge Instructions    Call MD for:  difficulty breathing, headache or visual disturbances   Complete by: As directed    Call MD for:  extreme fatigue   Complete by: As directed    Call MD for:  persistant dizziness or light-headedness   Complete by: As directed    Call MD for:  persistant nausea and vomiting   Complete by: As directed    Call MD for:  severe uncontrolled pain   Complete by: As directed    Call MD for:  temperature >100.4   Complete by: As directed    Diet - low sodium heart healthy   Complete by: As directed    Increase activity slowly   Complete by: As directed      Allergies as of 07/05/2020      Reactions   Other Anaphylaxis, Itching, Swelling   Allergy to GRITS , unknown exactly the specific ingredient he is allergic too      Medication List    TAKE these medications   amLODipine 10 MG tablet Commonly known as: NORVASC Take 1 tablet (10 mg total) by mouth daily. What changed: Another medication with the same name was removed. Continue taking this medication, and follow the directions you see here.   ammonium lactate 12 % lotion Commonly known as: LAC-HYDRIN Apply 1 application topically 2 (two) times daily. Both legs   aspirin 81 MG  chewable tablet Chew 1 tablet (81 mg total) by mouth daily. Start taking on: Jul 06, 2020   atorvastatin 80 MG tablet Commonly known as: LIPITOR Take 1 tablet (80 mg total) by mouth at bedtime. What changed:   medication strength  how much to take  when to take this   Basaglar KwikPen 100 UNIT/ML Inject 30 Units into the skin at bedtime. What changed:   how much to take  Another medication with the same name was removed. Continue taking this medication, and follow the directions you see here.   carvedilol 12.5 MG tablet Commonly known as: COREG Take 1 tablet (12.5 mg total) by mouth 2 (two) times daily with a meal.   feeding supplement Liqd Take 237 mLs by mouth 2 (two) times daily between meals.   furosemide 80 MG tablet Commonly known as: LASIX Take 0.5 tablets (40 mg total) by mouth daily.   insulin aspart 100 UNIT/ML injection Commonly known as: novoLOG Inject 12 Units into the skin 3 (three) times daily before meals. For blood sugars 0-150 give 0 units of insulin, 151-200 give 2 units of insulin, 201-250 give 4 units, 251-300 give 6 units, 301-350 give 8 units, 351-400 give 10 units,> 400 give 12 units and call M.D. What changed:   how much to take  when to take this  additional instructions   pantoprazole 40 MG tablet Commonly known as: PROTONIX Take 1 tablet (40 mg total) by mouth daily. Start taking on: Jul 06, 2020   polyethylene glycol 17 g packet Commonly known as: MIRALAX / GLYCOLAX  Take 17 g by mouth daily as needed. What changed: reasons to take this   pregabalin 75 MG capsule Commonly known as: LYRICA Take 1 capsule (75 mg total) by mouth 2 (two) times daily.   sacubitril-valsartan 49-51 MG Commonly known as: ENTRESTO Take 1 tablet by mouth 2 (two) times daily.   sertraline 50 MG tablet Commonly known as: ZOLOFT Take 1 tablet (50 mg total) by mouth daily.   tamsulosin 0.4 MG Caps capsule Commonly known as: FLOMAX Take 2 capsules (0.8  mg total) by mouth daily. Start taking on: Jul 06, 2020 What changed: how much to take            Durable Medical Equipment  (From admission, onward)         Start     Ordered   07/03/20 1308  For home use only DME Walker rolling  Once       Question Answer Comment  Walker: With [place] Wheels   Patient needs a walker to treat with the following condition Weakness      07/03/20 1307          Follow-up Information    [name], NP. Schedule an appointment as soon as possible for a visit in 1 week(s).   Specialty: Internal Medicine Contact information: [id number] [address] [phone]              Allergies  Allergen Reactions  . Other Anaphylaxis, Itching and Swelling    Allergy to GRITS , unknown exactly the specific ingredient he is allergic too    Consultations:  [facility], Dr. Fuller Plan  Neurology, Dr. Leonie Man, Dr. Cheral Marker  Cardiology, Dr. Einar Gip, Dr. Terri Skains, Dr. Virgina Jock   Procedures/Studies: DG Chest 2 View  Result Date: 06/30/2020 CLINICAL DATA:  Chest pain. EXAM: CHEST - 2 VIEW COMPARISON:  Jun 25, 2020 FINDINGS: The cardiac silhouette is mildly enlarged. Both lungs are clear. The visualized skeletal structures are unremarkable. IMPRESSION: Stable cardiomegaly without active cardiopulmonary disease. Electronically Signed   By: Virgina Norfolk M.D.   On: 06/30/2020 01:27   DG Abd 1 View  Result Date: 06/26/2020 CLINICAL DATA:  Onset abdominal pain and vomiting yesterday. EXAM: ABDOMEN - 1 VIEW COMPARISON:  Single-view of the abdomen 01/27/2020. FINDINGS: Mild gaseous distention of the stomach. No small bowel dilatation. Large stool burden in the ascending and transverse colon noted. No unexpected abdominal calcification. IMPRESSION: Mild gaseous distention of the stomach is likely incidental. Large stool burden ascending and transverse colon. Electronically Signed   By: Inge Rise M.D.   On: 06/26/2020 12:10   CT HEAD  WO CONTRAST  Result Date: 06/25/2020 CLINICAL DATA:  60 year old male with headache and chest pain. EXAM: CT HEAD WITHOUT CONTRAST TECHNIQUE: Contiguous axial images were obtained from the base of the skull through the vertex without intravenous contrast. COMPARISON:  Head CT 09/28/2017. FINDINGS: Brain: Small new hypodensity in the left caudate on series 2, image 15. Subtle dystrophic bilateral basal ganglia calcifications. Generalized cerebral volume loss since 2019. No midline shift, ventriculomegaly, mass effect, evidence of mass lesion, intracranial hemorrhage or evidence of cortically based acute infarction. Outside of the left caudate gray-white matter differentiation appears stable and within normal limits. Vascular: Calcified atherosclerosis at the skull base. No suspicious intracranial vascular hyperdensity. Skull: No acute osseous abnormality identified. Sinuses/Orbits: Visualized paranasal sinuses and mastoids are stable and well aerated. Other: There is a broad-based right vertex scalp fluid collection most resembling hematoma on series 3, image  67. Underlying calvarium appears stable and intact. Otherwise stable and negative visible orbit and scalp soft tissues. IMPRESSION: 1. Right vertex scalp abnormality most resembling a small scalp hematoma. Underlying calvarium intact. 2. Age indeterminate lacunar infarct of the left caudate nucleus, new since 2019. Query right side weakness. 3. No other acute intracranial abnormality identified. Electronically Signed   By: Genevie Ann M.D.   On: 06/25/2020 08:04   CT ANGIO CHEST PE W OR WO CONTRAST  Result Date: 06/25/2020 CLINICAL DATA:  Chest pain and headache. EXAM: CT ANGIOGRAPHY CHEST WITH CONTRAST TECHNIQUE: Multidetector CT imaging of the chest was performed using the standard protocol during bolus administration of intravenous contrast. Multiplanar CT image reconstructions and MIPs were obtained to evaluate the vascular anatomy. CONTRAST:  40mL OMNIPAQUE  IOHEXOL 350 MG/ML SOLN COMPARISON:  12/12/2019 FINDINGS: Cardiovascular: Heart is enlarged. Small pericardial effusion has increased minimally in the interval. Coronary artery calcification is evident. Atherosclerotic calcification is noted in the wall of the thoracic aorta. Enlargement of the pulmonary outflow tract and main pulmonary arteries suggests pulmonary arterial hypertension. There is no filling defect within the opacified pulmonary arteries to suggest the presence of an acute pulmonary embolus. Mediastinum/Nodes: Bilateral thyroid nodules evident measuring up to 2.6 cm on the right. No mediastinal lymphadenopathy. Upper normal hilar lymph nodes evident bilaterally. The esophagus has normal imaging features. There is no axillary lymphadenopathy. Lungs/Pleura: Patchy ground-glass opacity is identified in the upper lungs bilaterally with dependent ground-glass airspace disease in the lower lobes, right greater than left and potentially atelectasis. Several tiny pulmonary nodules are evident but no overtly suspicious pulmonary nodule or mass. Small bilateral pleural effusions. Upper Abdomen: Calcified gallstone incompletely visualized but measuring up to at least 2.2 cm. Musculoskeletal: No worrisome lytic or sclerotic osseous abnormality. Bilateral gynecomastia with body wall edema. Review of the MIP images confirms the above findings. IMPRESSION: 1. No CT evidence for acute pulmonary embolus. 2. Enlargement of the pulmonary outflow tract and main pulmonary arteries suggests pulmonary arterial hypertension. 3. Patchy ground-glass opacity in the upper lungs bilaterally with dependent ground-glass airspace disease in the lower lobes, right greater than left. Basilar findings may reflect atelectasis although superimposed infectious/inflammatory etiology not excluded. 4. Small bilateral pleural effusions. 5. Bilateral thyroid nodules measuring up to 2.6 cm on the right. Recommend thyroid US (ref: J Am Coll  Radiol. 2015 Feb;12(2): 143-50). 6. Cholelithiasis. 7. Aortic Atherosclerosis (ICD10-I70.0). Electronically Signed   By: Misty Stanley M.D.   On: 06/25/2020 08:02   MR ANGIO HEAD WO CONTRAST  Result Date: 07/04/2020 CLINICAL DATA:  Transient ischemic attack.  Right-sided weakness. EXAM: MRI HEAD WITHOUT CONTRAST MRA HEAD WITHOUT CONTRAST TECHNIQUE: Multiplanar, multi-echo pulse sequences of the brain and surrounding structures were acquired without intravenous contrast. Angiographic images of the Circle of Willis were acquired using MRA technique without intravenous contrast. COMPARISON: No pertinent prior exam. COMPARISON:  Head CT 07/02/2020 FINDINGS: MRI HEAD FINDINGS Brain: Chronic small vessel ischemic type changes of the pons, with hemosiderin deposition. No sign of acute infarction in that location. No focal cerebellar finding. Cerebral hemispheres show extensive abnormal T2 and FLAIR signal within the cerebral hemispheric deep white matter and splenium of the corpus callosum. The pattern could represent small vessel disease, demyelinating disease or a combination of both. No cortical or large vessel territory infarction. Diffusion imaging suggests minimal restricted diffusion in the right splenium of the corpus callosum and right frontal deep white matter, but these could possibly be areas of T2 shine through. No mass  lesion, acute hemorrhage, hydrocephalus or extra-axial collection. Vascular: Major vessels at the base of the brain show flow. Skull and upper cervical spine: Negative Sinuses/Orbits: Clear/normal Other: None MRA HEAD FINDINGS Anterior circulation: Both internal carotid arteries are widely patent through the skull base and siphon regions. The anterior and middle cerebral vessels are patent. No large or medium vessel occlusion. No apparent correctable proximal stenosis. No aneurysm or vascular malformation. Posterior circulation: Left vertebral artery terminates in PICA. Right vertebral  artery is widely patent to the basilar. No basilar stenosis. Posterior circulation branch vessels appear normal. Right PCA receives most of it supply from the anterior circulation. Anatomic variants: None other significant. IMPRESSION: No intracranial large or medium vessel occlusion or correctable proximal stenosis identified. Abnormal brain MRI showing abnormal signal within the pons and hemispheric deep and subcortical white matter, including the splenium of the corpus callosum. Hemosiderin deposition within the areas of prior pontine insult. Findings could be due entirely to chronic small vessel disease, but the possibility of coexisting demyelinating disease is not excluded. Additionally, diffusion imaging suggests low level restricted diffusion in the splenium of the corpus callosum and right frontal white matter, though this could possibly be T2 shine through. It does raise the possibility of either subacute ischemic changes or active demyelinating disease. Electronically Signed   By: Nelson Chimes M.D.   On: 07/04/2020 09:40   MR BRAIN WO CONTRAST  Result Date: 07/04/2020 CLINICAL DATA:  Transient ischemic attack.  Right-sided weakness. EXAM: MRI HEAD WITHOUT CONTRAST MRA HEAD WITHOUT CONTRAST TECHNIQUE: Multiplanar, multi-echo pulse sequences of the brain and surrounding structures were acquired without intravenous contrast. Angiographic images of the Circle of Willis were acquired using MRA technique without intravenous contrast. COMPARISON: No pertinent prior exam. COMPARISON:  Head CT 07/02/2020 FINDINGS: MRI HEAD FINDINGS Brain: Chronic small vessel ischemic type changes of the pons, with hemosiderin deposition. No sign of acute infarction in that location. No focal cerebellar finding. Cerebral hemispheres show extensive abnormal T2 and FLAIR signal within the cerebral hemispheric deep white matter and splenium of the corpus callosum. The pattern could represent small vessel disease, demyelinating  disease or a combination of both. No cortical or large vessel territory infarction. Diffusion imaging suggests minimal restricted diffusion in the right splenium of the corpus callosum and right frontal deep white matter, but these could possibly be areas of T2 shine through. No mass lesion, acute hemorrhage, hydrocephalus or extra-axial collection. Vascular: Major vessels at the base of the brain show flow. Skull and upper cervical spine: Negative Sinuses/Orbits: Clear/normal Other: None MRA HEAD FINDINGS Anterior circulation: Both internal carotid arteries are widely patent through the skull base and siphon regions. The anterior and middle cerebral vessels are patent. No large or medium vessel occlusion. No apparent correctable proximal stenosis. No aneurysm or vascular malformation. Posterior circulation: Left vertebral artery terminates in PICA. Right vertebral artery is widely patent to the basilar. No basilar stenosis. Posterior circulation branch vessels appear normal. Right PCA receives most of it supply from the anterior circulation. Anatomic variants: None other significant. IMPRESSION: No intracranial large or medium vessel occlusion or correctable proximal stenosis identified. Abnormal brain MRI showing abnormal signal within the pons and hemispheric deep and subcortical white matter, including the splenium of the corpus callosum. Hemosiderin deposition within the areas of prior pontine insult. Findings could be due entirely to chronic small vessel disease, but the possibility of coexisting demyelinating disease is not excluded. Additionally, diffusion imaging suggests low level restricted diffusion in the splenium of the  corpus callosum and right frontal white matter, though this could possibly be T2 shine through. It does raise the possibility of either subacute ischemic changes or active demyelinating disease. Electronically Signed   By: Nelson Chimes M.D.   On: 07/04/2020 09:40   NM Myocar Multi  W/Spect Tamela Oddi Motion / EF  Result Date: 06/27/2020 CLINICAL DATA:  Chest pain/anginal equivalent. Known coronary artery disease. Diabetes and hypertension. EXAM: MYOCARDIAL IMAGING WITH SPECT (REST AND PHARMACOLOGIC-STRESS) GATED LEFT VENTRICULAR WALL MOTION STUDY LEFT VENTRICULAR EJECTION FRACTION TECHNIQUE: Standard myocardial SPECT imaging was performed after resting intravenous injection of 10 mCi Tc-51m tetrofosmin. Subsequently, intravenous infusion of Lexiscan was performed under the supervision of the Cardiology staff. At peak effect of the drug, 30 mCi Tc-62m tetrofosmin was injected intravenously and standard myocardial SPECT imaging was performed. Quantitative gated imaging was also performed to evaluate left ventricular wall motion, and estimate left ventricular ejection fraction. COMPARISON:  None. FINDINGS: Perfusion: Decreased myocardial activity is seen in the inferior wall on both stress and resting images, likely due to diaphragmatic attenuation. No reversible myocardial perfusion defects are seen to suggest the presence of inducible ischemia. Wall Motion: Normal left ventricular wall motion. No left ventricular dilation. Left Ventricular Ejection Fraction: 50 % End diastolic volume 629 ml End systolic volume 67 ml IMPRESSION: 1. No reversible ischemia. Probable inferior wall diaphragmatic attenuation. 2. Normal left ventricular wall motion. 3. Left ventricular ejection fraction 50% 4. Non invasive risk stratification*: Low *2012 Appropriate Use Criteria for Coronary Revascularization Focused Update: J Am Coll Cardiol. 5284;13(2):440-102. http://content.airportbarriers.com.aspx?articleid=[email or web address] Electronically Signed   By: Marlaine Hind M.D.   On: 06/27/2020 15:29   DG Chest Port 1 View  Result Date: 06/25/2020 CLINICAL DATA:  60 year old male with chest pain and shortness of breath. EXAM: PORTABLE CHEST 1 VIEW COMPARISON:  Portable chest 03/08/2020 and earlier. FINDINGS: Portable AP upright  view at 0504 hours. Improved lung volumes and ventilation compared to January. Normalized pulmonary vascularity. Visualized tracheal air column is within normal limits. No pneumothorax, pleural effusion or confluent pulmonary opacity. Stable mild cardiomegaly and other mediastinal contours. No acute osseous abnormality identified. Prior cervical ACDF. Paucity of bowel gas in the upper abdomen. IMPRESSION: Resolved pulmonary edema since January. No acute cardiopulmonary abnormality. Electronically Signed   By: Genevie Ann M.D.   On: 06/25/2020 05:38   ECHOCARDIOGRAM COMPLETE  Result Date: 06/25/2020    ECHOCARDIOGRAM REPORT   Patient Name:   [name] Date of Exam: 06/25/2020 Medical Rec #:  [id number]     Height:       68.0 in Accession #:    [id number]    Weight:       229.0 lb Date of Birth:  17-Sep-1960      BSA:          2.165 m Patient Age:    60 years      BP:           170/107 mmHg Patient Gender: M             HR:           86 bpm. Exam Location:  Inpatient Procedure: 2D Echo, Cardiac Doppler, Color Doppler and Strain Analysis Indications:    R07.9* Chest pain, unspecified  History:        Patient has prior history of Echocardiogram examinations, most                 recent 12/11/2017. Stroke; Risk Factors:Hypertension.  Sonographer:  Merrie Roof Referring Phys: [id number] [place]  1. Left ventricular ejection fraction, by estimation, is 60 to 65%. The left ventricle has normal function. The left ventricle has no regional wall motion abnormalities. There is severe left ventricular hypertrophy. Left ventricular diastolic parameters  are consistent with Grade I diastolic dysfunction (impaired relaxation).  2. Right ventricular systolic function is normal. The right ventricular size is normal. There is moderately elevated pulmonary artery systolic pressure. The estimated right ventricular systolic pressure is 17.7 mmHg.  3. Left atrial size was moderately dilated.  4. A small pericardial  effusion is present. The pericardial effusion is circumferential. There is no evidence of cardiac tamponade.  5. The mitral valve is normal in structure. Mild to moderate mitral valve regurgitation. No evidence of mitral stenosis.  6. The aortic valve is calcified. There is moderate thickening of the aortic valve. Aortic valve regurgitation is mild. Mild aortic valve stenosis. Aortic valve area, by VTI measures 1.26 cm. Aortic valve mean gradient measures 14.0 mmHg. Aortic valve Vmax measures 2.48 m/s.  7. The inferior vena cava is dilated in size with <50% respiratory variability, suggesting right atrial pressure of 15 mmHg.  8. There is a small patent foramen ovale. Comparison(s): No significant change from prior study. Prior images reviewed side by side. FINDINGS  Left Ventricle: Left ventricular ejection fraction, by estimation, is 60 to 65%. The left ventricle has normal function. The left ventricle has no regional wall motion abnormalities. Global longitudinal strain performed but not reported based on interpreter judgement due to suboptimal tracking. The left ventricular internal cavity size was normal in size. There is severe left ventricular hypertrophy. Left ventricular diastolic parameters are consistent with Grade I diastolic dysfunction (impaired relaxation). Right Ventricle: The right ventricular size is normal. No increase in right ventricular wall thickness. Right ventricular systolic function is normal. There is moderately elevated pulmonary artery systolic pressure. The tricuspid regurgitant velocity is 3.06 m/s, and with an assumed right atrial pressure of 15 mmHg, the estimated right ventricular systolic pressure is 93.9 mmHg. Left Atrium: Left atrial size was moderately dilated. Right Atrium: Right atrial size was normal in size. Pericardium: A small pericardial effusion is present. The pericardial effusion is circumferential. There is no evidence of cardiac tamponade. Mitral Valve: The mitral  valve is normal in structure. Mild to moderate mitral valve regurgitation. No evidence of mitral valve stenosis. Tricuspid Valve: The tricuspid valve is normal in structure. Tricuspid valve regurgitation is mild . No evidence of tricuspid stenosis. Aortic Valve: The aortic valve is calcified. There is moderate thickening of the aortic valve. There is moderate aortic valve annular calcification. Aortic valve regurgitation is mild. Mild aortic stenosis is present. Aortic valve mean gradient measures 14.0 mmHg. Aortic valve peak gradient measures 24.5 mmHg. Aortic valve area, by VTI measures 1.26 cm. Pulmonic Valve: The pulmonic valve was normal in structure. Pulmonic valve regurgitation is not visualized. No evidence of pulmonic stenosis. Aorta: The aortic root is normal in size and structure. Venous: The inferior vena cava is dilated in size with less than 50% respiratory variability, suggesting right atrial pressure of 15 mmHg. IAS/Shunts: No atrial level shunt detected by color flow Doppler. A small patent foramen ovale is detected.  LEFT VENTRICLE PLAX 2D LVIDd:         4.35 cm  Diastology LVIDs:         2.85 cm  LV e' medial:    4.03 cm/s LV PW:  1.45 cm  LV E/e' medial:  13.7 LV IVS:        1.40 cm  LV e' lateral:   6.42 cm/s LVOT diam:     2.10 cm  LV E/e' lateral: 8.6 LV SV:         57 LV SV Index:   27 LVOT Area:     3.46 cm  RIGHT VENTRICLE          IVC RV Basal diam:  3.70 cm  IVC diam: 2.50 cm LEFT ATRIUM              Index       RIGHT ATRIUM           Index LA diam:        4.65 cm  2.15 cm/m  RA Area:     24.30 cm LA Vol (A2C):   113.0 ml 52.20 ml/m RA Volume:   75.20 ml  34.74 ml/m LA Vol (A4C):   102.0 ml 47.12 ml/m LA Biplane Vol: 109.0 ml 50.35 ml/m  AORTIC VALVE AV Area (Vmax):    1.21 cm AV Area (Vmean):   1.13 cm AV Area (VTI):     1.26 cm AV Vmax:           247.50 cm/s AV Vmean:          171.500 cm/s AV VTI:            0.456 m AV Peak Grad:      24.5 mmHg AV Mean Grad:      14.0  mmHg LVOT Vmax:         86.50 cm/s LVOT Vmean:        56.100 cm/s LVOT VTI:          0.166 m LVOT/AV VTI ratio: 0.36  AORTA Ao Root diam: 3.10 cm Ao Asc diam:  3.60 cm MITRAL VALVE                TRICUSPID VALVE MV Area (PHT): 4.71 cm     TR Peak grad:   37.5 mmHg MV Decel Time: 161 msec     TR Vmax:        306.00 cm/s MV E velocity: 55.30 cm/s MV A velocity: 102.00 cm/s  SHUNTS MV E/A ratio:  0.54         Systemic VTI:  0.17 m                             Systemic Diam: 2.10 cm Candee Furbish MD Electronically signed by Candee Furbish MD Signature Date/Time: 06/25/2020/12:21:05 PM    Final    CT HEAD CODE STROKE WO CONTRAST  Result Date: 07/02/2020 CLINICAL DATA:  Code stroke. 60 year old male with vision changes and left side tingling. EXAM: CT HEAD WITHOUT CONTRAST TECHNIQUE: Contiguous axial images were obtained from the base of the skull through the vertex without intravenous contrast. COMPARISON:  Head CT 06/25/2020. FINDINGS: Brain: Asymmetric hypodensity in the left caudate appears stable from the recent CT, but there is also new or definitely increased hypodensity in the left thalamus also on series 3, image 16 today. Compare also coronal image 34 today to image 33 previously. Gray-white matter differentiation is stable elsewhere, with probable perivascular space of the right lentiform on image 14. No midline shift, ventriculomegaly, mass effect, evidence of mass lesion, intracranial hemorrhage or evidence of cortically based acute infarction. Vascular: Calcified atherosclerosis at the skull base.  No suspicious intracranial vascular hyperdensity. No suspicious intracranial vascular hyperdensity. Skull: Negative. Sinuses/Orbits: Visualized paranasal sinuses and mastoids are stable and well aerated. Other: Right vertex scalp hematoma or edema has resolved. Visualized orbits and scalp soft tissues are within normal limits. ASPECTS [employer] Stroke Program Early CT Score) Total score (0-10 with 10 being normal): 10  IMPRESSION: 1. Evidence of small vessel ischemia in the left caudate and the left thalamus, with the latter more apparent than on a Head CT 1 week ago. 2. No acute cortically based infarct or acute intracranial hemorrhage identified. ASPECTS 10. 3. These results were communicated to Dr. Cheral Marker at [place] 5/8/2022by text page via the [facility] messaging system. Electronically Signed   By: Genevie Ann M.D.   On: 07/02/2020 11:15   VAS US CAROTID  Result Date: 07/03/2020 Carotid Arterial Duplex Study Patient Name:  ULUS HAZEN  Date of Exam:   07/03/2020 Medical Rec #: [id number]      Accession #:    [id number] Date of Birth: Jul 18, 1960       Patient Gender: M Patient Age:   88Y Exam Location:  [facility] Procedure:      VAS US CAROTID Referring Phys: [id number] Hosie Poisson --------------------------------------------------------------------------------  Indications:       TIA and Blurry vision of the left eye. Risk Factors:      Hypertension, Diabetes, prior CVA. Other Factors:     History of Covid 19 03/08/20, CHF, remote history of                    polysubstance abuse. Comparison Study:  No prior study on file Performing Technologist: Sharion Dove RVS  Examination Guidelines: A complete evaluation includes B-mode imaging, spectral Doppler, color Doppler, and power Doppler as needed of all accessible portions of each vessel. Bilateral testing is considered an integral part of a complete examination. Limited examinations for reoccurring indications may be performed as noted.  Right Carotid Findings: +----------+--------+--------+--------+------------------+------------------+           PSV cm/sEDV cm/sStenosisPlaque DescriptionComments           +----------+--------+--------+--------+------------------+------------------+ CCA Prox  75      19                                intimal thickening +----------+--------+--------+--------+------------------+------------------+ CCA Distal63      19                                 intimal thickening +----------+--------+--------+--------+------------------+------------------+ ICA Prox  61      23              heterogenous                         +----------+--------+--------+--------+------------------+------------------+ ICA Distal72      22                                                   +----------+--------+--------+--------+------------------+------------------+ ECA       98      19                                                   +----------+--------+--------+--------+------------------+------------------+ +----------+--------+-------+--------+-------------------+  PSV cm/sEDV cmsDescribeArm Pressure (mmHG) +----------+--------+-------+--------+-------------------+ [id number]                                         +----------+--------+-------+--------+-------------------+ +---------+--------+--+--------+--+ VertebralPSV cm/s60EDV cm/s18 +---------+--------+--+--------+--+  Left Carotid Findings: +----------+--------+--------+--------+------------------+------------------+           PSV cm/sEDV cm/sStenosisPlaque DescriptionComments           +----------+--------+--------+--------+------------------+------------------+ CCA Prox  73      17                                intimal thickening +----------+--------+--------+--------+------------------+------------------+ CCA Distal66      19                                intimal thickening +----------+--------+--------+--------+------------------+------------------+ ICA Prox  68      24              heterogenous                         +----------+--------+--------+--------+------------------+------------------+ ICA Distal58      22                                                   +----------+--------+--------+--------+------------------+------------------+ ECA       92      12                                                    +----------+--------+--------+--------+------------------+------------------+ +----------+--------+--------+--------+-------------------+           PSV cm/sEDV cm/sDescribeArm Pressure (mmHG) +----------+--------+--------+--------+-------------------+ [id number]                                          +----------+--------+--------+--------+-------------------+ +---------+--------+--+--------+-+ VertebralPSV cm/s40EDV cm/s9 +---------+--------+--+--------+-+   Summary: Right Carotid: The extracranial vessels were near-normal with only minimal wall                thickening or plaque. Left Carotid: The extracranial vessels were near-normal with only minimal wall               thickening or plaque. Vertebrals:  Bilateral vertebral arteries demonstrate antegrade flow. Subclavians: Normal flow hemodynamics were seen in bilateral subclavian              arteries. *See table(s) above for measurements and observations.  Electronically signed by Antony Contras MD on 07/03/2020 at 12:37:25 PM.    Final       Subjective: Patient seen and examined at bedside, resting comfortably.  No complaints this morning.  Cardiology, neurology, gastroenterology have all signed off.  Patient discharging back to ALF today.  No other questions or concerns at this time.  Denies headache, no current visual changes, no chest pain, no shortness of breath, no abdominal pain, no fever/chills/night sweats, no nausea/vomiting/diarrhea.  No acute events overnight per nursing staff.  Discharge Exam: Vitals:  07/05/20 0800 07/05/20 1139  BP: 122/81   Pulse: 79   Resp: 16 18  Temp: 98 F (36.7 C) 97.8 F (36.6 C)  SpO2: 99%    Vitals:   07/05/20 0530 07/05/20 0731 07/05/20 0800 07/05/20 1139  BP: 122/77 (!) 157/102 122/81   Pulse: 77 76 79   Resp: 18 18 16 18   Temp: 98.7 F (37.1 C) 97.9 F (36.6 C) 98 F (36.7 C) 97.8 F (36.6 C)  TempSrc: Oral Oral Oral Oral  SpO2: 98% 97% 99%   Weight:      Height:         General: Pt is alert, awake, not in acute distress Cardiovascular: RRR, S1/S2 +, no rubs, no gallops Respiratory: CTA bilaterally, no wheezing, no rhonchi, on room air Abdominal: Soft, NT, ND, bowel sounds + Extremities: Trace pedal edema, no cyanosis    The results of significant diagnostics from this hospitalization (including imaging, microbiology, ancillary and laboratory) are listed below for reference.     Microbiology: Recent Results (from the past 240 hour(s))  SARS CORONAVIRUS 2 (TAT 6-24 HRS) Nasopharyngeal Nasopharyngeal Swab     Status: None   Collection Time: 06/30/20 12:47 PM   Specimen: Nasopharyngeal Swab  Result Value Ref Range Status   SARS Coronavirus 2 NEGATIVE NEGATIVE Final    Comment: (NOTE) SARS-CoV-2 target nucleic acids are NOT DETECTED.  The SARS-CoV-2 RNA is generally detectable in upper and lower respiratory specimens during the acute phase of infection. Negative results do not preclude SARS-CoV-2 infection, do not rule out co-infections with other pathogens, and should not be used as the sole basis for treatment or other patient management decisions. Negative results must be combined with clinical observations, patient history, and epidemiological information. The expected result is Negative.  Fact Sheet for Patients: SugarRoll.be  Fact Sheet for Healthcare Providers: https://www.woods-mathews.com/  This test is not yet approved or cleared by the [place] FDA and  has been authorized for detection and/or diagnosis of SARS-CoV-2 by FDA under an Emergency Use Authorization (EUA). This EUA will remain  in effect (meaning this test can be used) for the duration of the COVID-19 declaration under Se ction 564(b)(1) of the Act, 21 U.S.C. section 360bbb-3(b)(1), unless the authorization is terminated or revoked sooner.  Performed at [facility], [place] [address] [place], [place] [address]    SARS CORONAVIRUS 2 (TAT 6-24 HRS) Nasopharyngeal Nasopharyngeal Swab     Status: None   Collection Time: 07/04/20  2:45 PM   Specimen: Nasopharyngeal Swab  Result Value Ref Range Status   SARS Coronavirus 2 NEGATIVE NEGATIVE Final    Comment: (NOTE) SARS-CoV-2 target nucleic acids are NOT DETECTED.  The SARS-CoV-2 RNA is generally detectable in upper and lower respiratory specimens during the acute phase of infection. Negative results do not preclude SARS-CoV-2 infection, do not rule out co-infections with other pathogens, and should not be used as the sole basis for treatment or other patient management decisions. Negative results must be combined with clinical observations, patient history, and epidemiological information. The expected result is Negative.  Fact Sheet for Patients: SugarRoll.be  Fact Sheet for Healthcare Providers: https://www.woods-mathews.com/  This test is not yet approved or cleared by the [place] FDA and  has been authorized for detection and/or diagnosis of SARS-CoV-2 by FDA under an Emergency Use Authorization (EUA). This EUA will remain  in effect (meaning this test can be used) for the duration of the COVID-19 declaration under Se ction 564(b)(1) of the  Act, 21 U.S.C. section 360bbb-3(b)(1), unless the authorization is terminated or revoked sooner.  Performed at [facility], [place] [address], [place], [place] [address]      Labs: BNP (last 3 results) Recent Labs    12/31/19 2122 06/25/20 0456 06/30/20 0039  BNP 116.8* 168.5* 09.3   Basic Metabolic Panel: Recent Labs  Lab 06/30/20 0039 07/01/20 0245 07/02/20 0126 07/03/20 0950  NA 135 133* 132* 133*  K 3.9 4.0 4.3 4.5  CL 106 102 103 106  CO2 25 24 25 22   GLUCOSE 170* 202* 228* 179*  BUN 17 17 20 20   CREATININE 1.52* 1.69* 1.61* 1.31*  CALCIUM 8.4* 8.2* 8.0* 8.1*   Liver Function Tests: Recent Labs  Lab 06/30/20 0039  AST 15   ALT 10  ALKPHOS 76  BILITOT 0.5  PROT 5.7*  ALBUMIN 1.8*   Recent Labs  Lab 06/30/20 0040  LIPASE 33   No results for input(s): AMMONIA in the last 168 hours. CBC: Recent Labs  Lab 06/30/20 0040 07/01/20 0245 07/02/20 0126  WBC 3.5* 3.3* 2.8*  NEUTROABS  --   --  0.8*  HGB 11.5* 11.1* 10.0*  HCT 37.4* 35.5* 32.6*  MCV 81.0 80.3 79.9*  PLT 260 227 230   Cardiac Enzymes: No results for input(s): CKTOTAL, CKMB, CKMBINDEX, TROPONINI in the last 168 hours. BNP: Invalid input(s): POCBNP CBG: Recent Labs  Lab 07/04/20 1141 07/04/20 1641 07/04/20 2036 07/05/20 0729 07/05/20 1141  GLUCAP 246* 347* 311* 191* 183*   D-Dimer No results for input(s): DDIMER in the last 72 hours. Hgb A1c No results for input(s): HGBA1C in the last 72 hours. Lipid Profile No results for input(s): CHOL, HDL, LDLCALC, TRIG, CHOLHDL, LDLDIRECT in the last 72 hours. Thyroid function studies No results for input(s): TSH, T4TOTAL, T3FREE, THYROIDAB in the last 72 hours.  Invalid input(s): FREET3 Anemia work up No results for input(s): VITAMINB12, FOLATE, FERRITIN, TIBC, IRON, RETICCTPCT in the last 72 hours. Urinalysis    Component Value Date/Time   COLORURINE YELLOW 03/08/2020 1946   APPEARANCEUR TURBID (A) 03/08/2020 1946   APPEARANCEUR Clear 12/31/2013 2116   LABSPEC 1.016 03/08/2020 1946   LABSPEC 1.032 12/31/2013 2116   PHURINE 6.0 03/08/2020 1946   GLUCOSEU >=500 (A) 03/08/2020 1946   GLUCOSEU >=500 12/31/2013 2116   HGBUR NEGATIVE 03/08/2020 1946   BILIRUBINUR NEGATIVE 03/08/2020 1946   BILIRUBINUR Negative 12/31/2013 2116   KETONESUR NEGATIVE 03/08/2020 1946   PROTEINUR >=300 (A) 03/08/2020 1946   UROBILINOGEN 1.0 08/28/2013 1709   NITRITE NEGATIVE 03/08/2020 1946   LEUKOCYTESUR LARGE (A) 03/08/2020 1946   LEUKOCYTESUR Negative 12/31/2013 2116   Sepsis Labs Invalid input(s): PROCALCITONIN,  WBC,  LACTICIDVEN Microbiology Recent Results (from the past 240 hour(s))  SARS  CORONAVIRUS 2 (TAT 6-24 HRS) Nasopharyngeal Nasopharyngeal Swab     Status: None   Collection Time: 06/30/20 12:47 PM   Specimen: Nasopharyngeal Swab  Result Value Ref Range Status   SARS Coronavirus 2 NEGATIVE NEGATIVE Final    Comment: (NOTE) SARS-CoV-2 target nucleic acids are NOT DETECTED.  The SARS-CoV-2 RNA is generally detectable in upper and lower respiratory specimens during the acute phase of infection. Negative results do not preclude SARS-CoV-2 infection, do not rule out co-infections with other pathogens, and should not be used as the sole basis for treatment or other patient management decisions. Negative results must be combined with clinical observations, patient history, and epidemiological information. The expected result is Negative.  Fact Sheet for Patients: SugarRoll.be  Fact Sheet for Healthcare Providers: https://www.woods-mathews.com/  This test is not yet approved or cleared by the [place] FDA and  has been authorized for detection and/or diagnosis of SARS-CoV-2 by FDA under an Emergency Use Authorization (EUA). This EUA will remain  in effect (meaning this test can be used) for the duration of the COVID-19 declaration under Se ction 564(b)(1) of the Act, 21 U.S.C. section 360bbb-3(b)(1), unless the authorization is terminated or revoked sooner.  Performed at [facility], [place] [address] [place], [place] [address]   SARS CORONAVIRUS 2 (TAT 6-24 HRS) Nasopharyngeal Nasopharyngeal Swab     Status: None   Collection Time: 07/04/20  2:45 PM   Specimen: Nasopharyngeal Swab  Result Value Ref Range Status   SARS Coronavirus 2 NEGATIVE NEGATIVE Final    Comment: (NOTE) SARS-CoV-2 target nucleic acids are NOT DETECTED.  The SARS-CoV-2 RNA is generally detectable in upper and lower respiratory specimens during the acute phase of infection. Negative results do not preclude SARS-CoV-2 infection, do not rule  out co-infections with other pathogens, and should not be used as the sole basis for treatment or other patient management decisions. Negative results must be combined with clinical observations, patient history, and epidemiological information. The expected result is Negative.  Fact Sheet for Patients: SugarRoll.be  Fact Sheet for Healthcare Providers: https://www.woods-mathews.com/  This test is not yet approved or cleared by the [place] FDA and  has been authorized for detection and/or diagnosis of SARS-CoV-2 by FDA under an Emergency Use Authorization (EUA). This EUA will remain  in effect (meaning this test can be used) for the duration of the COVID-19 declaration under Se ction 564(b)(1) of the Act, 21 U.S.C. section 360bbb-3(b)(1), unless the authorization is terminated or revoked sooner.  Performed at [facility], [place] [address], [place], [place] [address]      Time coordinating discharge: Over 30 minutes  SIGNED:   Yarden Hillis J [place] DO  [facility] 07/05/2020, 11:52 AM

## 2020-07-05 NOTE — TOC Transition Note (Addendum)
Transition of Care [facility]) - CM/SW Discharge Note   Patient Details  Name: [name] MRN: [id number] Date of Birth: 07-20-1960  [facility] CM/SW Contact:  Trula Ore, [place] Phone Number: 07/05/2020, 2:15 PM   Clinical Narrative:     Patient will DC to: [facility]   Anticipated DC date: 07/05/2020  Family notified: Nurse, mental health by: [place]  ?  Per MD patient ready for DC to [facility] . RN, patient, patient's family, and facility notified of DC. Discharge [place], [place] orders, and covid result sent to facility. RN given number for report tele# [phone]. DC packet on chart. Ambulance transport requested for patient.  CSW signing off.    Final next level of care: Assisted Living ([facility]) Barriers to Discharge: No Barriers Identified   Patient Goals and CMS Choice Patient states their goals for this hospitalization and ongoing recovery are:: to go to ALF CMS Medicare.gov Compare Post Acute Care list provided to:: Patient Choice offered to / list presented to : Patient  Discharge Placement              Patient chooses bed at: [facility] ([facility]) Patient to be transferred to facility by: [facility] Transportation Name of family member notified: [name] Patient and family notified of of transfer: 07/05/20  Discharge Plan and Services In-house Referral: Clinical Social Work                                   Social Determinants of Health ([place]) Interventions     Readmission Risk Interventions Readmission Risk Prevention Plan 01/12/2020  Transportation Screening Complete  Palliative Care Screening Not Applicable  Some recent data might be hidden

## 2020-07-05 NOTE — Progress Notes (Signed)
Inpatient Diabetes Program Recommendations  AACE/ADA: New Consensus Statement on Inpatient Glycemic Control (2015)  Target Ranges:  Prepandial:   less than 140 mg/dL      Peak postprandial:   less than 180 mg/dL (1-2 hours)      Critically ill patients:  140 - 180 mg/dL   Lab Results  Component Value Date   GLUCAP 191 (H) 07/05/2020   HGBA1C 9.1 (H) 06/25/2020    Review of Glycemic Control Results for NICKALOS, PETERSEN (MRN [id number]) as of 07/05/2020 09:21  Ref. Range 07/04/2020 08:05 07/04/2020 09:28 07/04/2020 11:41 07/04/2020 16:41 07/05/2020 07:29  Glucose-Capillary Latest Ref Range: 70 - 99 mg/dL 191 (H) 153 (H) 246 (H) 347 (H) 191 (H)    Diabetes history: DM 2 Outpatient Diabetes medications: Basaglar 20 units qhs, Novolog 2-12 units tid Current orders for Inpatient glycemic control:  Lantus 30 units Novolog 0-9 units tid + hs  Inpatient Diabetes Program Recommendations:    Note: Decadron 5 mg given during surgery  -  Increase Novolog to 0-15 units tid + hs -  Add Novolog 2 units tid meal coverage if >50% of meals  Will titrate insulin within 48 hours when decadron clears.  Thanks,  Tama Headings RN, MSN, BC-ADM Inpatient Diabetes Coordinator Team Pager [phone] (8a-5p)

## 2020-07-05 NOTE — Evaluation (Signed)
Physical Therapy Evaluation Patient Details Name: [name] MRN: [id number] DOB: May 26, 1960 Today's Date: 07/05/2020   History of Present Illness  Pt is a 60 y.o. male recently admitted 06/25/20 with SOB/chest pain secondary to hypertensive urgency, stress test 5/3 with no significant ischemia, now readmitted from ALF on 06/30/20 with chest pain and vomiting. Rapid response called 5/8 with pt c/o blurred vision; per neuro, etiology for transient symptoms likely 2/2 to transient cerebral hypoperfusion from lowered BP following NTG tx of CP. Head CT with ill-defined ischemia in L caudate and thalamus; MRI shows no acute injury. PMH includes CVA (residual R-side weakness), HTN, DM, CHF, arthritis, bipolar affective, depression.    Clinical Impression  Pt presents with an overall decrease in functional mobility secondary to above. PTA, pt lives alone at ALF, ambulating very short distances with [facility], primarily uses electric w/c for mobility. Today, pt able to ambulate short distance with RW and intermittent min guard for balance. New RW already delivered to pt's room, adjusted for correct height. Pt reports to d/c home today; increased time discussing safety and mobility recommendations for return home, as well as recommendation for HHPT services. If to remain admitted, will follow acutely to address established goals.   SpO2 100% on RA, HR 77    Follow Up Recommendations Home health PT;Supervision - Intermittent    Equipment Recommendations  Rolling walker with 5" wheels (already delivered)    Recommendations for Other Services       Precautions / Restrictions Precautions Precautions: Fall;Other (comment) Precaution Comments: H/o CVA (residual R-side weakness) Restrictions Weight Bearing Restrictions: No      Mobility  Bed Mobility Overal bed mobility: Needs Assistance Bed Mobility: Supine to Sit     Supine to sit: Min assist     General bed mobility comments: MinA for HHA to  elevate trunk    Transfers Overall transfer level: Needs assistance Equipment used: Rolling walker (2 wheeled) Transfers: Sit to/from Stand Sit to Stand: Min guard            Ambulation/Gait Ambulation/Gait assistance: Counsellor (Feet): 28 Feet Assistive device: Rolling walker (2 wheeled) Gait Pattern/deviations: Step-through pattern;Decreased stride length;Trunk flexed;Decreased dorsiflexion - right Gait velocity: Decreased   General Gait Details: Slow, mildly unsteady gait with RW and intermittent min guard for balance; pt clearly fatigued and SOB but wanting to walk further, requiring cues for activity pacing with pt later realizing he would not have been able to walk much further  Stairs            Wheelchair Mobility    Modified Rankin (Stroke Patients Only)       Balance Overall balance assessment: Needs assistance   Sitting balance-Leahy Scale: Fair       Standing balance-Leahy Scale: Poor Standing balance comment: Reliant on UE support                             Pertinent Vitals/Pain Pain Assessment: Faces Faces Pain Scale: Hurts a little bit Pain Location: Abdomen Pain Descriptors / Indicators: Guarding Pain Intervention(s): Monitored during session    Home Living Family/patient expects to be discharged to:: Assisted living               Home Equipment: Walker - 4 wheels;Cane - single point;Wheelchair - power;Shower seat Additional Comments: Resides at [facility]F. Accessible room with walk-in shower    Prior Function Level of Independence: Needs assistance  Gait / Transfers Assistance Needed: Reports mod indep with transfers to/from power w/c, typically using SPC, but needs a RW  ADL's / Homemaking Assistance Needed: Reports mod indep with ADLs, except needs assist from staff for bathing; able to transfer himself from w/c to shower bench        Hand Dominance        Extremity/Trunk Assessment    Upper Extremity Assessment Upper Extremity Assessment: Generalized weakness    Lower Extremity Assessment Lower Extremity Assessment: Generalized weakness       Communication   Communication: No difficulties  Cognition Arousal/Alertness: Awake/alert Behavior During Therapy: WFL for tasks assessed/performed Overall Cognitive Status: Within Functional Limits for tasks assessed                                 General Comments: WFL for simple tasks; pleasant and agreeable; some decreased insight into medical status, suspect near baseline cognition      General Comments General comments (skin integrity, edema, etc.): Increased time discussing safe d/c home today - pt reports brother to drive him (as opposed to ambulance transport), so pt will have to walk long hallway to get to room; pt reports family will not know how to use electric w/c to bring to him, therefore recommended family assist for following with chair for seated rest breaks if w/c not available from facility to use    Exercises     Assessment/Plan    PT Assessment Patient needs continued PT services  PT Problem List Decreased strength;Decreased activity tolerance;Decreased balance;Decreased mobility;Cardiopulmonary status limiting activity       PT Treatment Interventions DME instruction;Gait training;Functional mobility training;Therapeutic activities;Therapeutic exercise;Balance training;Patient/family education;Wheelchair mobility training    PT Goals (Current goals can be found in the Care Plan section)  Acute Rehab PT Goals Patient Stated Goal: home today PT Goal Formulation: With patient Time For Goal Achievement: 07/19/20 Potential to Achieve Goals: Good    Frequency Min 3X/week   Barriers to discharge        Co-evaluation               AM-PAC PT "6 Clicks" Mobility  Outcome Measure Help needed turning from your back to your side while in a flat bed without using bedrails?:  None Help needed moving from lying on your back to sitting on the side of a flat bed without using bedrails?: A Little Help needed moving to and from a bed to a chair (including a wheelchair)?: A Little Help needed standing up from a chair using your arms (e.g., wheelchair or bedside chair)?: A Little Help needed to walk in hospital room?: A Little Help needed climbing 3-5 steps with a railing? : A Lot 6 Click Score: 18    End of Session   Activity Tolerance: Patient tolerated treatment well;Patient limited by fatigue Patient left: in chair;with call bell/phone within reach Nurse Communication: Mobility status PT Visit Diagnosis: Other abnormalities of gait and mobility (R26.89);Muscle weakness (generalized) (M62.81)    Time: 3664-4034 PT Time Calculation (min) (ACUTE ONLY): 30 min   Charges:   PT Evaluation $PT Eval Moderate Complexity: 1 Mod PT Treatments $Self Care/Home Management: 8-22   Mabeline Caras, PT, DPT Acute Rehabilitation Services  Pager [phone] Office [place] 07/05/2020, 11:42 AM

## 2020-07-06 ENCOUNTER — Telehealth: Payer: Self-pay

## 2020-07-06 ENCOUNTER — Ambulatory Visit: Payer: Self-pay | Admitting: Student

## 2020-07-06 NOTE — Telephone Encounter (Signed)
Transition Care Management Unsuccessful Follow-up Telephone Call  Date of discharge and from where:  07/05/2020, [facility]  Attempts:  1st Attempt  Reason for unsuccessful TCM follow-up call:  Unable to leave message - voicemail not set up # [phone], calls placed x 2.   Patient resides as [facility].  He has an appointment with Juluis Mire, NP at [facility] 07/11/2020.

## 2020-07-07 ENCOUNTER — Telehealth: Payer: Self-pay

## 2020-07-07 ENCOUNTER — Telehealth (INDEPENDENT_AMBULATORY_CARE_PROVIDER_SITE_OTHER): Payer: Self-pay | Admitting: Primary Care

## 2020-07-07 NOTE — Telephone Encounter (Signed)
Please follow up

## 2020-07-07 NOTE — Telephone Encounter (Signed)
Transition Care Management Unsuccessful Follow-up Telephone Call  Date of discharge and from where:  [facility] on 07/05/2020  Attempts:  1st Attempt  Reason for unsuccessful TCM follow-up call:  Called pt at [phone] [facility] Phone)  [phone] unable to reach or leave VM . No option.  Pt needs to schedule a F/u appt with PCP.

## 2020-07-07 NOTE — Telephone Encounter (Signed)
Home Health Verbal Orders - Caller/Agency: Duaine Dredge Number: [phone] Requesting OT/PT/Skilled Nursing/Social Work/Speech Therapy: PT and Nursing  Frequency: Services will begin on Monday, if they can see him sooner they will do so. At latest Monday

## 2020-07-10 ENCOUNTER — Other Ambulatory Visit: Payer: Self-pay | Admitting: *Deleted

## 2020-07-10 ENCOUNTER — Encounter: Payer: Self-pay | Admitting: *Deleted

## 2020-07-10 ENCOUNTER — Telehealth: Payer: Self-pay

## 2020-07-10 NOTE — Patient Outreach (Signed)
[name]) Care Management  07/10/2020  [name] 02/15/1961 [id number]   Received Referral Received 5/11 Transition of care providers Initial Outreach   RN spoke with the pt today and enrolled into the Diabetes program due to his recent hospitalization and A1C currently at 9.1. Introduced the [name] services and program related to educating pt on his medical condition and potentially assisting pt in managing this condition. Pt reports due to a stroke in 10/2019 he is now at a ALF ([place]). States the facility is monitoring his DM very closely and they are responsibility for giving all his medications and checking his glucose readings which was 287 this AM. Pt states it is checked 3-4 X daily and his does not remember all the readings and unaware of most of his medications again provided by the facility. Offered to assist pt with managing this condition alone with the facility working with pt however would need additional information. Pt states he will be able to obtain a list of medications and his ongoing readings for follow up if needed. Several topics dicussed related to meals prep/nutritional for high proteins and low carbohydrates. Pt  Mobile with his electric wheelchair but able to ride local transit bus to go to the local grocery stores for supplemental snacks not provided by the facility on daily meals. Pt also attempted SCAT but not able to utilize this service.  Pt aware what he can not eat but not very satisfied with the meals provided.   All other medical condition managed with his HTN/HF as pt reports he elevates his LE with any swelling or fluid retention.  Enrolled and generated a plan of care related to diabetes with all barriers, goals and interventions discussed. Noted pt's comments and initial assessment completed accordingly. Will follow up with pt over the next month and notify his provider of pt's disposition with [facility] services.   Goals Addressed             This Visit's Progress   . [facility]-Monitor and Manage My Blood Sugar-Diabetes Type 2       Timeframe:  Short-Term Goal Priority:  Medium Start Date: 07/10/2020                       Expected End Date:  08/24/2020                     Follow Up Date 08/10/2020   - check blood sugar at prescribed times - check blood sugar if I feel it is too high or too low - enter blood sugar readings and medication or insulin into daily log - take the blood sugar log to all doctor visits Barriers: Health Behaviors Knowledge     Why is this important?    Checking your blood sugar at home helps to keep it from getting very high or very low.   Writing the results in a diary or log helps the doctor know how to care for you.   Your blood sugar log should have the time, date and the results.   Also, write down the amount of insulin or other medicine that you take.   Other information, like what you ate, exercise done and how you were feeling, will also be helpful.     Notes:  5/16-Pt reports he is currently at a ALF and they have been monitoring all his readings. Pt willing to document or obtain all readings for this program in  regulating his glucose readings in reducing his A1C and daily glucose levels. Pt aware of food to avoid and obtains other food items from the local [employer] in order to avoid what is not healthy on the meals trays received. Pt currently wheel chair bound but currently awaiting HHPT has prescribed on his discharge orders and will inquire with the facility on contacting the agency noted. Will send [facility] calendar for documenting all glucose readings for his providers to view.    . [facility]N-Set My Target A1C-Diabetes Type 2       Timeframe:  Long-Range Goal Priority:  Medium Start Date: 07/10/2020                            Expected End Date:  10/25/2020                Follow Up Date 08/10/2020   - set target A1C   Barriers: Health Behaviors Knowledge  Why is this important?    Your  target A1C is decided together by you and your doctor.   It is based on several things like your age and other health issues.    Notes:  5/16-Pt able to participate in the teach-back method of understanding the importance of reducing his A1C. Pt aware of the goal to reduce this readings over the next 3 months prior to a repeat of this lab. Pt aware to reduce carbohydrate and increase on his proteins with healthy snacks between his meals. Currently read is at 9.1.       Raina Mina, RN Care Management Coordinator [place] Office [phone]

## 2020-07-10 NOTE — Telephone Encounter (Signed)
Transition Care Management Unsuccessful Follow-up Telephone Call  Date of discharge and from where:  07/05/2020, [facility]   Attempts:  3rd Attempt  Reason for unsuccessful TCM follow-up call:  Unable to leave message - voicemail not set up on # [phone].   Patient had been discharged to [place]. Letter sent to him requesting he contact [facility] to schedule a follow up appointment.

## 2020-07-11 ENCOUNTER — Other Ambulatory Visit: Payer: Self-pay

## 2020-07-11 ENCOUNTER — Encounter (HOSPITAL_COMMUNITY): Payer: Self-pay | Admitting: Emergency Medicine

## 2020-07-11 ENCOUNTER — Emergency Department (HOSPITAL_COMMUNITY)
Admission: EM | Admit: 2020-07-11 | Discharge: 2020-07-12 | Disposition: A | Discharge status: Skilled Nursing Facility | Payer: Medicare HMO | Attending: Emergency Medicine | Admitting: Emergency Medicine

## 2020-07-11 ENCOUNTER — Emergency Department (HOSPITAL_COMMUNITY): Payer: Medicare HMO

## 2020-07-11 ENCOUNTER — Ambulatory Visit (INDEPENDENT_AMBULATORY_CARE_PROVIDER_SITE_OTHER): Payer: Medicare HMO | Admitting: Primary Care

## 2020-07-11 DIAGNOSIS — Z7982 Long term (current) use of aspirin: Secondary | ICD-10-CM | POA: Diagnosis not present

## 2020-07-11 DIAGNOSIS — E860 Dehydration: Secondary | ICD-10-CM | POA: Diagnosis not present

## 2020-07-11 DIAGNOSIS — R112 Nausea with vomiting, unspecified: Secondary | ICD-10-CM | POA: Diagnosis not present

## 2020-07-11 DIAGNOSIS — N184 Chronic kidney disease, stage 4 (severe): Secondary | ICD-10-CM | POA: Diagnosis not present

## 2020-07-11 DIAGNOSIS — Z87891 Personal history of nicotine dependence: Secondary | ICD-10-CM | POA: Diagnosis not present

## 2020-07-11 DIAGNOSIS — K529 Noninfective gastroenteritis and colitis, unspecified: Secondary | ICD-10-CM | POA: Insufficient documentation

## 2020-07-11 DIAGNOSIS — I5032 Chronic diastolic (congestive) heart failure: Secondary | ICD-10-CM | POA: Insufficient documentation

## 2020-07-11 DIAGNOSIS — Z794 Long term (current) use of insulin: Secondary | ICD-10-CM | POA: Diagnosis not present

## 2020-07-11 DIAGNOSIS — E1140 Type 2 diabetes mellitus with diabetic neuropathy, unspecified: Secondary | ICD-10-CM | POA: Diagnosis not present

## 2020-07-11 DIAGNOSIS — Z79899 Other long term (current) drug therapy: Secondary | ICD-10-CM | POA: Diagnosis not present

## 2020-07-11 DIAGNOSIS — K6289 Other specified diseases of anus and rectum: Secondary | ICD-10-CM | POA: Diagnosis not present

## 2020-07-11 DIAGNOSIS — I130 Hypertensive heart and chronic kidney disease with heart failure and stage 1 through stage 4 chronic kidney disease, or unspecified chronic kidney disease: Secondary | ICD-10-CM | POA: Insufficient documentation

## 2020-07-11 DIAGNOSIS — E1122 Type 2 diabetes mellitus with diabetic chronic kidney disease: Secondary | ICD-10-CM | POA: Diagnosis not present

## 2020-07-11 DIAGNOSIS — Z8673 Personal history of transient ischemic attack (TIA), and cerebral infarction without residual deficits: Secondary | ICD-10-CM | POA: Diagnosis not present

## 2020-07-11 DIAGNOSIS — K8020 Calculus of gallbladder without cholecystitis without obstruction: Secondary | ICD-10-CM | POA: Diagnosis not present

## 2020-07-11 DIAGNOSIS — I10 Essential (primary) hypertension: Secondary | ICD-10-CM | POA: Diagnosis not present

## 2020-07-11 DIAGNOSIS — R1111 Vomiting without nausea: Secondary | ICD-10-CM | POA: Diagnosis not present

## 2020-07-11 DIAGNOSIS — R197 Diarrhea, unspecified: Secondary | ICD-10-CM | POA: Diagnosis not present

## 2020-07-11 DIAGNOSIS — R1084 Generalized abdominal pain: Secondary | ICD-10-CM | POA: Diagnosis not present

## 2020-07-11 LAB — LIPASE, BLOOD: Lipase: 21 U/L (ref 11–51)

## 2020-07-11 LAB — COMPREHENSIVE METABOLIC PANEL
ALT: 13 U/L (ref 0–44)
AST: 14 U/L — ABNORMAL LOW (ref 15–41)
Albumin: 2.4 g/dL — ABNORMAL LOW (ref 3.5–5.0)
Alkaline Phosphatase: 74 U/L (ref 38–126)
Anion gap: 4 — ABNORMAL LOW (ref 5–15)
BUN: 30 mg/dL — ABNORMAL HIGH (ref 6–20)
CO2: 24 mmol/L (ref 22–32)
Calcium: 8.4 mg/dL — ABNORMAL LOW (ref 8.9–10.3)
Chloride: 107 mmol/L (ref 98–111)
Creatinine, Ser: 1.41 mg/dL — ABNORMAL HIGH (ref 0.61–1.24)
GFR, Estimated: 58 mL/min — ABNORMAL LOW (ref >60)
Glucose, Bld: 102 mg/dL — ABNORMAL HIGH (ref 70–99)
Potassium: 4.8 mmol/L (ref 3.5–5.1)
Sodium: 135 mmol/L (ref 135–145)
Total Bilirubin: 0.4 mg/dL (ref 0.3–1.2)
Total Protein: 6.6 g/dL (ref 6.5–8.1)

## 2020-07-11 LAB — CBC WITH DIFFERENTIAL/PLATELET
Abs Immature Granulocytes: 0.01 10*3/uL (ref 0.00–0.07)
Basophils Absolute: 0 10*3/uL (ref 0.0–0.1)
Basophils Relative: 1 %
Eosinophils Absolute: 0.1 10*3/uL (ref 0.0–0.5)
Eosinophils Relative: 4 %
HCT: 36.8 % — ABNORMAL LOW (ref 39.0–52.0)
Hemoglobin: 11.3 g/dL — ABNORMAL LOW (ref 13.0–17.0)
Immature Granulocytes: 0 %
Lymphocytes Relative: 39 %
Lymphs Abs: 1.3 10*3/uL (ref 0.7–4.0)
MCH: 25.3 pg — ABNORMAL LOW (ref 26.0–34.0)
MCHC: 30.7 g/dL (ref 30.0–36.0)
MCV: 82.3 fL (ref 80.0–100.0)
Monocytes Absolute: 0.4 10*3/uL (ref 0.1–1.0)
Monocytes Relative: 13 %
Neutro Abs: 1.4 10*3/uL — ABNORMAL LOW (ref 1.7–7.7)
Neutrophils Relative %: 43 %
Platelets: 233 10*3/uL (ref 150–400)
RBC: 4.47 MIL/uL (ref 4.22–5.81)
RDW: 14.6 % (ref 11.5–15.5)
WBC: 3.3 10*3/uL — ABNORMAL LOW (ref 4.0–10.5)
nRBC: 0 % (ref 0.0–0.2)

## 2020-07-11 LAB — URINALYSIS, ROUTINE W REFLEX MICROSCOPIC
Bacteria, UA: NONE SEEN
Bilirubin Urine: NEGATIVE
Glucose, UA: 150 mg/dL — AB
Ketones, ur: NEGATIVE mg/dL
Leukocytes,Ua: NEGATIVE
Nitrite: NEGATIVE
Protein, ur: >=300 mg/dL — AB
Specific Gravity, Urine: 1.01 (ref 1.005–1.030)
pH: 5 (ref 5.0–8.0)

## 2020-07-11 LAB — POC OCCULT BLOOD, ED: Fecal Occult Bld: NEGATIVE

## 2020-07-11 MED ORDER — PIPERACILLIN-TAZOBACTAM 3.375 G IVPB 30 MIN
3.3750 g | Freq: Once | INTRAVENOUS | Status: AC
  Administered 2020-07-11: 3.375 g via INTRAVENOUS
  Filled 2020-07-11: qty 50

## 2020-07-11 MED ORDER — ONDANSETRON 4 MG PO TBDP: ORAL_TABLET | ORAL | 0 refills | Status: DC

## 2020-07-11 MED ORDER — AMOXICILLIN-POT CLAVULANATE 875-125 MG PO TABS: 1.0000 | ORAL_TABLET | Freq: Two times a day (BID) | ORAL | 0 refills | Status: DC

## 2020-07-11 MED ORDER — ONDANSETRON HCL 4 MG/2ML IJ SOLN
4.0000 mg | Freq: Once | INTRAMUSCULAR | Status: AC
  Administered 2020-07-11: 4 mg via INTRAVENOUS
  Filled 2020-07-11: qty 2

## 2020-07-11 MED ORDER — SODIUM CHLORIDE 0.9 % IV BOLUS
1000.0000 mL | Freq: Once | INTRAVENOUS | Status: AC
  Administered 2020-07-11: 1000 mL via INTRAVENOUS

## 2020-07-11 NOTE — Telephone Encounter (Signed)
Verbal orders provided to Santiago Glad at [facility]. Amy was out office.

## 2020-07-11 NOTE — ED Triage Notes (Signed)
Pt BIBA from [facility] with c/o vomiting and diarrhea shortly after eating lunch today at facility. Emesis  x2 and diarrhea x1. Pt reports there was "blood" in the diarrhea.  BP 138/90 HR-72 99% room air CBG 106 T-97.7 Pain 8/10

## 2020-07-11 NOTE — ED Provider Notes (Signed)
[facility] DEPT Provider Note   CSN: [id number] Arrival date & time: 07/11/20  1659     History Chief Complaint  Patient presents with  . Emesis  . Nausea    [name] is a 60 y.o. male history of heart failure, diabetes, hypertension, previous stroke, here presenting with blood in his stool or abdominal pain and vomiting.  Patient was just admitted for strokelike symptoms.  His MRI did not show a stroke.  He also has nonspecific EKG changes.  Patient states that he was admitted to the hospital and was discharged several days ago.  He is now at [facility].  He states that he has been having vomiting and diarrhea shortly after eating lunch today.  He states that there were several episodes of blood in his diarrhea.  He also has 2 episodes of vomiting denies any fevers.  Denies any sick contacts at the nursing home.   The history is provided by the patient.       Past Medical History:  Diagnosis Date  . Arthritis   . Bipolar affective ([place])   . CHF (congestive heart failure) ([place])   . Claustrophobia    Severe  . Depression   . Diabetes mellitus    uncontrolled   . Heart murmur   . Hypertension     Patient Active Problem List   Diagnosis Date Noted  . TIA (transient ischemic attack) 07/03/2020  . Acute on chronic heart failure with preserved ejection fraction ([place])   . Pulmonary artery hypertension ([place]) 06/25/2020  . CKD (chronic kidney disease) 06/25/2020  . History of cerebrovascular accident (CVA) with residual deficit 06/25/2020  . Chronic kidney disease, stage 4 (severe) ([place]) 03/30/2020  . Morbid (severe) obesity due to excess calories ([place]) 03/30/2020  . Malnutrition of moderate degree 01/25/2020  . Palliative care by specialist   . Goals of care, counseling/discussion   . Nephrotic syndrome 01/06/2020  . Anasarca 01/06/2020  . Hypokalemia due to loss of potassium 01/04/2020  . Generalized body aches 01/01/2020  .  Non-traumatic rhabdomyolysis 01/01/2020  . Grade I diastolic dysfunction 97/41/6384  . Microcytic anemia 01/01/2020  . Hypokalemia 12/31/2019  . Elevated CK   . Chronic diastolic CHF (congestive heart failure) ([place]) 12/31/2017  . Diabetes mellitus type 2, uncontrolled ([place])   . Pure hypercholesterolemia   . Hyperglycemia 09/28/2017  . Nonobstructive atherosclerosis of coronary artery 09/28/2017  . MDD (major depressive disorder), recurrent episode, severe ([place]) 07/30/2017  . Osteoarthritis 04/12/2015  . Severe recurrent major depression with psychotic features ([place]) 01/04/2015  . Substance-related disorder ([place]) 10/30/2014  . Patient's noncompliance with other medical treatment and regimen 06/07/2014  . Cocaine dependence, uncomplicated ([place]) 53/64/6803  . Chronic pain 05/31/2014  . Cocaine abuse ([place]) 05/31/2014  . History of noncompliance with medical treatment 05/31/2014  . Malingering 05/31/2014  . Leukopenia 05/31/2014  . Diabetic neuropathy associated with diabetes mellitus due to underlying condition ([place]) 05/31/2014  . Diabetic neuropathy ([place]) 05/31/2014  . Congestive heart failure with left ventricular systolic dysfunction ([place]) 02/22/2014  . Depression, major, recurrent, severe with psychosis ([place]) 08/29/2013  . Suicidal ideations 08/28/2013  . Uncontrolled diabetes mellitus ([place]) 06/30/2013  . Atypical chest pain 12/09/2011  . Essential hypertension 12/09/2011  . Episodic mood disorder ([place]) 05/13/2011    Past Surgical History:  Procedure Laterality Date  . BOWEL DECOMPRESSION N/A 01/19/2020   Procedure: BOWEL DECOMPRESSION;  Surgeon: Otis Brace, MD;  Location: [facility];  Service: Gastroenterology;  Laterality: N/A;  . COLONOSCOPY WITH PROPOFOL N/A 01/19/2020   Procedure: COLONOSCOPY WITH PROPOFOL;  Surgeon: Otis Brace, MD;  Location: [facility];  Service: Gastroenterology;  Laterality: N/A;  . KNEE SURGERY     b/l knees; pending total knee   .  RADIOLOGY WITH ANESTHESIA N/A 07/04/2020   Procedure: MRI WITH ANESTHESIA BRAIN WITHOUT CONTRAST AND HEAD WITHOUT CONTRAST;  Surgeon: Radiologist, Medication, MD;  Location: [facility];  Service: Radiology;  Laterality: N/A;       Family History  Problem Relation Age of Onset  . Diabetes Other   . Cancer Mother        uterus; deceased   . Heart attack Mother 34  . Hypertension Other   . Heart attack Father 52  . Stroke Sister        12/2012 age 60 y.o    Social History   Tobacco Use  . Smoking status: Former Research scientist (life sciences)  . Smokeless tobacco: Never Used  Vaping Use  . Vaping Use: Never used  Substance Use Topics  . Alcohol use: No  . Drug use: Not Currently    Types: Cocaine    Comment: Last use 2019    Home Medications Prior to Admission medications   Medication Sig Start Date End Date Taking? Authorizing Provider  amLODipine (NORVASC) 10 MG tablet Take 1 tablet (10 mg total) by mouth daily. 07/05/20 10/03/20  [place] Donnamarie Poag, DO  ammonium lactate (LAC-HYDRIN) 12 % lotion Apply 1 application topically 2 (two) times daily. Both legs 05/19/20   [provider]  aspirin 81 MG chewable tablet Chew 1 tablet (81 mg total) by mouth daily. 07/06/20 10/04/20  [place] Donnamarie Poag, DO  atorvastatin (LIPITOR) 80 MG tablet Take 1 tablet (80 mg total) by mouth at bedtime. 07/05/20 10/03/20  [place] Donnamarie Poag, DO  carvedilol (COREG) 12.5 MG tablet Take 1 tablet (12.5 mg total) by mouth 2 (two) times daily with a meal. 07/05/20 10/03/20  [place] Donnamarie Poag, DO  feeding supplement (ENSURE ENLIVE / ENSURE PLUS) LIQD Take 237 mLs by mouth 2 (two) times daily between meals. 06/28/20 09/26/20  Hosie Poisson, MD  furosemide (LASIX) 80 MG tablet Take 0.5 tablets (40 mg total) by mouth daily. 07/05/20 10/03/20  [place] Donnamarie Poag, DO  insulin aspart (NOVOLOG) 100 UNIT/ML injection Inject 12 Units into the skin 3 (three) times daily before meals. For blood sugars 0-150 give 0 units of insulin, 151-200 give 2 units of insulin,  201-250 give 4 units, 251-300 give 6 units, 301-350 give 8 units, 351-400 give 10 units,> 400 give 12 units and call M.D. Patient taking differently: Inject 2-12 Units into the skin See admin instructions. Check and record blood sugar before meals and inject per ssi. 151-200 = 2 units  201-250 = 4 units  251-300 = 6 units  301-400 = 10 units  greater than 400 = 12 units and call MD. 03/30/20   [name], NP  Insulin Glargine (BASAGLAR KWIKPEN) 100 UNIT/ML Inject 30 Units into the skin at bedtime. 07/05/20 10/03/20  [place] Eric J, DO  pantoprazole (PROTONIX) 40 MG tablet Take 1 tablet (40 mg total) by mouth daily. 07/06/20 10/04/20  [place] Donnamarie Poag, DO  polyethylene glycol (MIRALAX / GLYCOLAX) 17 g packet Take 17 g by mouth daily as needed. Patient taking differently: Take 17 g by mouth daily as needed for mild constipation. 03/30/20   [name], NP  pregabalin (LYRICA) 75 MG capsule Take 1 capsule (75 mg total) by  mouth 2 (two) times daily. 07/05/20 10/03/20  [place] Eric J, DO  sacubitril-valsartan (ENTRESTO) 49-51 MG Take 1 tablet by mouth 2 (two) times daily. 07/05/20 10/03/20  [place] Donnamarie Poag, DO  sertraline (ZOLOFT) 50 MG tablet Take 1 tablet (50 mg total) by mouth daily. 07/05/20 10/03/20  [place] Donnamarie Poag, DO  tamsulosin (FLOMAX) 0.4 MG CAPS capsule Take 2 capsules (0.8 mg total) by mouth daily. 07/06/20 10/04/20  [place] Eric J, DO    Allergies    Other  Review of Systems   Review of Systems  Gastrointestinal: Positive for diarrhea and vomiting.  All other systems reviewed and are negative.   Physical Exam Updated Vital Signs BP (!) 162/102   Pulse 72   Temp 97.7 F (36.5 C) (Oral)   Resp 13   Ht 5\' 8"  (1.727 m)   Wt 115 kg   SpO2 98%   BMI 38.55 kg/m   Physical Exam Vitals and nursing note reviewed.  Constitutional:      Appearance: Normal appearance.     Comments: Chronically ill-appearing and dehydrated  HENT:     Head: Normocephalic.     Nose: Nose normal.      Mouth/Throat:     Mouth: Mucous membranes are dry.  Eyes:     Extraocular Movements: Extraocular movements intact.     Pupils: Pupils are equal, round, and reactive to light.  Cardiovascular:     Rate and Rhythm: Normal rate and regular rhythm.     Pulses: Normal pulses.     Heart sounds: Normal heart sounds.  Pulmonary:     Effort: Pulmonary effort is normal.     Breath sounds: Normal breath sounds.  Abdominal:     General: Abdomen is flat.     Comments: Mild suprapubic tenderness.  Genitourinary:    Comments: Rectal- brown stools  Musculoskeletal:        General: Normal range of motion.     Cervical back: Normal range of motion and neck supple.  Skin:    General: Skin is warm.     Capillary Refill: Capillary refill takes less than 2 seconds.  Neurological:     General: No focal deficit present.     Mental Status: He is alert and oriented to person, place, and time.  Psychiatric:        Mood and Affect: Mood normal.        Behavior: Behavior normal.     ED Results / Procedures / Treatments   Labs (all labs ordered are listed, but only abnormal results are displayed) Labs Reviewed  CBC WITH DIFFERENTIAL/PLATELET - Abnormal; Notable for the following components:      Result Value   WBC 3.3 (*)    Hemoglobin 11.3 (*)    HCT 36.8 (*)    MCH 25.3 (*)    Neutro Abs 1.4 (*)    All other components within normal limits  COMPREHENSIVE METABOLIC PANEL - Abnormal; Notable for the following components:   Glucose, Bld 102 (*)    BUN 30 (*)    Creatinine, Ser 1.41 (*)    Calcium 8.4 (*)    Albumin 2.4 (*)    AST 14 (*)    GFR, Estimated 58 (*)    Anion gap 4 (*)    All other components within normal limits  LIPASE, BLOOD  URINALYSIS, ROUTINE W REFLEX MICROSCOPIC  POC OCCULT BLOOD, ED    EKG EKG Interpretation  Date/Time:  Tuesday Jul 11 2020 18:08:25 EDT Ventricular  Rate:  71 PR Interval:  195 QRS Duration: 83 QT Interval:  411 QTC Calculation: 447 R  Axis:   14 Text Interpretation: Sinus rhythm Anterior infarct, old No significant change since last tracing Confirmed by Wandra Arthurs [phone]) on 07/11/2020 6:19:07 PM   Radiology CT ABDOMEN PELVIS WO CONTRAST  Result Date: 07/11/2020 CLINICAL DATA:  Vomiting and diarrhea. EXAM: CT ABDOMEN AND PELVIS WITHOUT CONTRAST TECHNIQUE: Multidetector CT imaging of the abdomen and pelvis was performed following the standard protocol without IV contrast. COMPARISON:  03/08/2020 FINDINGS: Lower chest: Moderate pericardial effusion, mildly increased from prior. Circumflex coronary artery atherosclerotic calcification. Mild cardiomegaly. Gynecomastia on the right. Subcutaneous edema along the lower chest. Mild airway thickening in the lower lobes. Hepatobiliary: 2.6 cm gallstone in the gallbladder. No biliary dilatation. Pancreas: Unremarkable Spleen: Unremarkable Adrenals/Urinary Tract: Unremarkable Stomach/Bowel: Frothy fluid in the distal colon corresponding to the patient's reported diarrheal process. Borderline rectal wall thickening. No dilated small bowel. Low-grade mesenteric edema although not disproportionate to the diffuse subcutaneous edema. Vascular/Lymphatic: Mild aortoiliac atherosclerotic vascular calcification. Multiple upper normal likely reactive inguinal lymph nodes. Reproductive: Unremarkable Other: Diffuse subcutaneous and mesenteric edema. Musculoskeletal: Lumbar spondylosis and degenerative disc disease with impingement at L2-3, L3-4, L4-5, and L5-S1. IMPRESSION: 1. Frothy fluid in the distal colon compatible with patient's reported diarrheal process. Borderline rectal wall thickening could reflect a low-grade distal colitis. 2. Diffuse subcutaneous and mesenteric edema suggesting third spacing of fluid, correlate with any hypoproteinemia/hypoalbuminemia. 3. Moderate pericardial effusion, mildly increased from prior. 4. Other imaging findings of potential clinical significance: Aortic Atherosclerosis  (ICD10-I70.0). Coronary atherosclerosis. Mild cardiomegaly. Airway thickening is present, suggesting bronchitis or reactive airways disease. Cholelithiasis. Multilevel lumbar impingement due to spondylosis and degenerative disc disease. Electronically Signed   By: Van Clines M.D.   On: 07/11/2020 18:58    Procedures Procedures   Medications Ordered in ED Medications  sodium chloride 0.9 % bolus 1,000 mL (1,000 mLs Intravenous Bolus 07/11/20 1851)  ondansetron (ZOFRAN) injection 4 mg (4 mg Intravenous Given 07/11/20 1851)  piperacillin-tazobactam (ZOSYN) IVPB 3.375 g (3.375 g Intravenous New Bag/Given 07/11/20 1939)    ED Course  I have reviewed the triage vital signs and the nursing notes.  Pertinent labs & imaging results that were available during my care of the patient were reviewed by me and considered in my medical decision making (see chart for details).    MDM Rules/Calculators/A&P                          HOPE HOLST is a 60 y.o. male here with vomiting and diarrhea and abdominal pain.  Likely viral gastroenteritis versus colitis versus diverticulitis. Plan to get CBC, CMP, lipase, CT abdomen pelvis.  Will hydrate and reassess  8:34 PM Patient's guaiac is negative.  Hemoglobin is stable.  Patient does have mild colitis.  Of note, patient does have a some pericardial effusion that was there last admission.  Patient is not hypoxic or have any respiratory symptoms.  Patient was given Zosyn in the ED.  I think it is most likely viral versus early bacterial.  Will discharge back to facility with Augmentin and Zofran as needed  Final Clinical Impression(s) / ED Diagnoses Final diagnoses:  None    Rx / DC Orders ED Discharge Orders    None       Drenda Freeze, MD 07/11/20 2034

## 2020-07-11 NOTE — Discharge Instructions (Signed)
Take Zofran for nausea  Take Augmentin twice daily for a week for colitis  See your doctor for follow-up  Expect some blood in your stool for several days.  Return to ER if you have severe abdominal pain, vomiting, fever, uncontrolled bleeding.

## 2020-07-11 NOTE — ED Notes (Signed)
[facility] called for pt return back to facility

## 2020-07-12 ENCOUNTER — Other Ambulatory Visit: Payer: Self-pay | Admitting: *Deleted

## 2020-07-12 DIAGNOSIS — Z743 Need for continuous supervision: Secondary | ICD-10-CM | POA: Diagnosis not present

## 2020-07-12 DIAGNOSIS — R110 Nausea: Secondary | ICD-10-CM | POA: Diagnosis not present

## 2020-07-12 DIAGNOSIS — K529 Noninfective gastroenteritis and colitis, unspecified: Secondary | ICD-10-CM | POA: Diagnosis not present

## 2020-07-12 DIAGNOSIS — I10 Essential (primary) hypertension: Secondary | ICD-10-CM | POA: Diagnosis not present

## 2020-07-12 NOTE — Patient Outreach (Signed)
[facility]) Care Management  07/12/2020  [name] Aug 20, 1960 [id number]   Telephone Assessment-ED visit  Pt calls to report a recent ED visit on yesterday at [facility]. States his diagnosis was colitis and he will pick her medications at from the pharmacy later today for antibiotics and Zofran. RN educated pt on the importance of taking all his medications and reporting any symptoms to his provider. Encourage pt to reach out to his provider with any updates or urgent needs. Pt states the facility handles all his care and has his discharge paperwork. Again RN case manager encouraged pt to report all symptoms to the facility representative if encountered. Pt verbalized an understanding with no additional inquires.  Will follow up next month if not sooner as pt has RN contact number for any additional issues.   Raina Mina, RN Care Management Coordinator [place] [phone]

## 2020-07-13 ENCOUNTER — Telehealth: Payer: Self-pay

## 2020-07-13 DIAGNOSIS — I130 Hypertensive heart and chronic kidney disease with heart failure and stage 1 through stage 4 chronic kidney disease, or unspecified chronic kidney disease: Secondary | ICD-10-CM | POA: Diagnosis not present

## 2020-07-13 DIAGNOSIS — I10 Essential (primary) hypertension: Secondary | ICD-10-CM | POA: Diagnosis not present

## 2020-07-13 DIAGNOSIS — E1122 Type 2 diabetes mellitus with diabetic chronic kidney disease: Secondary | ICD-10-CM | POA: Diagnosis not present

## 2020-07-13 DIAGNOSIS — N182 Chronic kidney disease, stage 2 (mild): Secondary | ICD-10-CM | POA: Diagnosis not present

## 2020-07-13 DIAGNOSIS — R69 Illness, unspecified: Secondary | ICD-10-CM | POA: Diagnosis not present

## 2020-07-13 DIAGNOSIS — E1165 Type 2 diabetes mellitus with hyperglycemia: Secondary | ICD-10-CM | POA: Diagnosis not present

## 2020-07-13 DIAGNOSIS — I69351 Hemiplegia and hemiparesis following cerebral infarction affecting right dominant side: Secondary | ICD-10-CM | POA: Diagnosis not present

## 2020-07-13 DIAGNOSIS — I5032 Chronic diastolic (congestive) heart failure: Secondary | ICD-10-CM | POA: Diagnosis not present

## 2020-07-13 DIAGNOSIS — D509 Iron deficiency anemia, unspecified: Secondary | ICD-10-CM | POA: Diagnosis not present

## 2020-07-13 DIAGNOSIS — I4891 Unspecified atrial fibrillation: Secondary | ICD-10-CM | POA: Diagnosis not present

## 2020-07-13 DIAGNOSIS — D72819 Decreased white blood cell count, unspecified: Secondary | ICD-10-CM | POA: Diagnosis not present

## 2020-07-13 NOTE — Telephone Encounter (Signed)
Sent to PCP as an [place]...  Spoke with Janett Billow at [facility]. Provided verbal orders. We discussed patients homebound status. Patient is not to leave facility for anything other than essential shopping and appointments. Patient has left facility 3 times this week on his own. If this continues [facility] will have to discharge him.

## 2020-07-13 NOTE — Telephone Encounter (Signed)
Copied from [place] [id number]. Topic: Quick Communication - Home Health Verbal Orders >> Jul 13, 2020  2:23 PM Tessa Lerner A wrote: Caller/Agency: Janett Billow / [facility]  Callback Number: [phone]  Requesting OT/PT/Skilled Nursing/Social Work/Speech Therapy: Skilled Nursing  Frequency: 1w6  Janett Billow shares that [facility] is uncertain of the patient's homebound status is in question, the patient continues to disclose to staff that they are leaving the home regularly

## 2020-07-18 DIAGNOSIS — D72819 Decreased white blood cell count, unspecified: Secondary | ICD-10-CM | POA: Diagnosis not present

## 2020-07-18 DIAGNOSIS — R69 Illness, unspecified: Secondary | ICD-10-CM | POA: Diagnosis not present

## 2020-07-18 DIAGNOSIS — E1165 Type 2 diabetes mellitus with hyperglycemia: Secondary | ICD-10-CM | POA: Diagnosis not present

## 2020-07-18 DIAGNOSIS — I130 Hypertensive heart and chronic kidney disease with heart failure and stage 1 through stage 4 chronic kidney disease, or unspecified chronic kidney disease: Secondary | ICD-10-CM | POA: Diagnosis not present

## 2020-07-18 DIAGNOSIS — I5032 Chronic diastolic (congestive) heart failure: Secondary | ICD-10-CM | POA: Diagnosis not present

## 2020-07-18 DIAGNOSIS — E1122 Type 2 diabetes mellitus with diabetic chronic kidney disease: Secondary | ICD-10-CM | POA: Diagnosis not present

## 2020-07-18 DIAGNOSIS — N182 Chronic kidney disease, stage 2 (mild): Secondary | ICD-10-CM | POA: Diagnosis not present

## 2020-07-18 DIAGNOSIS — I4891 Unspecified atrial fibrillation: Secondary | ICD-10-CM | POA: Diagnosis not present

## 2020-07-18 DIAGNOSIS — I69351 Hemiplegia and hemiparesis following cerebral infarction affecting right dominant side: Secondary | ICD-10-CM | POA: Diagnosis not present

## 2020-07-18 DIAGNOSIS — D509 Iron deficiency anemia, unspecified: Secondary | ICD-10-CM | POA: Diagnosis not present

## 2020-07-19 DIAGNOSIS — Z03818 Encounter for observation for suspected exposure to other biological agents ruled out: Secondary | ICD-10-CM | POA: Diagnosis not present

## 2020-07-20 DIAGNOSIS — D509 Iron deficiency anemia, unspecified: Secondary | ICD-10-CM | POA: Diagnosis not present

## 2020-07-20 DIAGNOSIS — N182 Chronic kidney disease, stage 2 (mild): Secondary | ICD-10-CM | POA: Diagnosis not present

## 2020-07-20 DIAGNOSIS — I69351 Hemiplegia and hemiparesis following cerebral infarction affecting right dominant side: Secondary | ICD-10-CM | POA: Diagnosis not present

## 2020-07-20 DIAGNOSIS — R69 Illness, unspecified: Secondary | ICD-10-CM | POA: Diagnosis not present

## 2020-07-20 DIAGNOSIS — E1122 Type 2 diabetes mellitus with diabetic chronic kidney disease: Secondary | ICD-10-CM | POA: Diagnosis not present

## 2020-07-20 DIAGNOSIS — D72819 Decreased white blood cell count, unspecified: Secondary | ICD-10-CM | POA: Diagnosis not present

## 2020-07-20 DIAGNOSIS — I5032 Chronic diastolic (congestive) heart failure: Secondary | ICD-10-CM | POA: Diagnosis not present

## 2020-07-20 DIAGNOSIS — E1165 Type 2 diabetes mellitus with hyperglycemia: Secondary | ICD-10-CM | POA: Diagnosis not present

## 2020-07-20 DIAGNOSIS — I130 Hypertensive heart and chronic kidney disease with heart failure and stage 1 through stage 4 chronic kidney disease, or unspecified chronic kidney disease: Secondary | ICD-10-CM | POA: Diagnosis not present

## 2020-07-20 DIAGNOSIS — I4891 Unspecified atrial fibrillation: Secondary | ICD-10-CM | POA: Diagnosis not present

## 2020-07-21 ENCOUNTER — Telehealth (INDEPENDENT_AMBULATORY_CARE_PROVIDER_SITE_OTHER): Payer: Self-pay | Admitting: Primary Care

## 2020-07-21 NOTE — Telephone Encounter (Signed)
[name] from [facility] is calling to reqeust verbal orders for PT frequency 1 w 1, 2 w 2, 1w 2  CB- [phone]  Ok to leave verbal on vm

## 2020-07-21 NOTE — Telephone Encounter (Signed)
Unable to contact as PCE agent did not take down a complete phone number.

## 2020-07-24 ENCOUNTER — Emergency Department (HOSPITAL_COMMUNITY)
Admission: EM | Admit: 2020-07-24 | Discharge: 2020-07-24 | Disposition: A | Discharge status: Home / Self Care | Payer: Medicare HMO | Attending: Emergency Medicine | Admitting: Emergency Medicine

## 2020-07-24 ENCOUNTER — Emergency Department (HOSPITAL_COMMUNITY): Payer: Medicare HMO

## 2020-07-24 DIAGNOSIS — Z87891 Personal history of nicotine dependence: Secondary | ICD-10-CM | POA: Diagnosis not present

## 2020-07-24 DIAGNOSIS — I130 Hypertensive heart and chronic kidney disease with heart failure and stage 1 through stage 4 chronic kidney disease, or unspecified chronic kidney disease: Secondary | ICD-10-CM | POA: Insufficient documentation

## 2020-07-24 DIAGNOSIS — M25461 Effusion, right knee: Secondary | ICD-10-CM | POA: Diagnosis not present

## 2020-07-24 DIAGNOSIS — Z7982 Long term (current) use of aspirin: Secondary | ICD-10-CM | POA: Diagnosis not present

## 2020-07-24 DIAGNOSIS — I5032 Chronic diastolic (congestive) heart failure: Secondary | ICD-10-CM | POA: Diagnosis not present

## 2020-07-24 DIAGNOSIS — Z7951 Long term (current) use of inhaled steroids: Secondary | ICD-10-CM | POA: Diagnosis not present

## 2020-07-24 DIAGNOSIS — M25561 Pain in right knee: Secondary | ICD-10-CM | POA: Insufficient documentation

## 2020-07-24 DIAGNOSIS — E1122 Type 2 diabetes mellitus with diabetic chronic kidney disease: Secondary | ICD-10-CM | POA: Insufficient documentation

## 2020-07-24 DIAGNOSIS — Z743 Need for continuous supervision: Secondary | ICD-10-CM | POA: Diagnosis not present

## 2020-07-24 DIAGNOSIS — E1140 Type 2 diabetes mellitus with diabetic neuropathy, unspecified: Secondary | ICD-10-CM | POA: Insufficient documentation

## 2020-07-24 DIAGNOSIS — W050XXA Fall from non-moving wheelchair, initial encounter: Secondary | ICD-10-CM | POA: Insufficient documentation

## 2020-07-24 DIAGNOSIS — R600 Localized edema: Secondary | ICD-10-CM | POA: Diagnosis not present

## 2020-07-24 DIAGNOSIS — N184 Chronic kidney disease, stage 4 (severe): Secondary | ICD-10-CM | POA: Diagnosis not present

## 2020-07-24 DIAGNOSIS — Z79899 Other long term (current) drug therapy: Secondary | ICD-10-CM | POA: Insufficient documentation

## 2020-07-24 DIAGNOSIS — M7989 Other specified soft tissue disorders: Secondary | ICD-10-CM | POA: Diagnosis not present

## 2020-07-24 DIAGNOSIS — I10 Essential (primary) hypertension: Secondary | ICD-10-CM | POA: Diagnosis not present

## 2020-07-24 MED ORDER — DICLOFENAC SODIUM 1 % EX GEL: 2.0000 g | Freq: Four times a day (QID) | CUTANEOUS | 0 refills | Status: AC

## 2020-07-24 MED ORDER — ACETAMINOPHEN 500 MG PO TABS
1000.0000 mg | ORAL_TABLET | Freq: Once | ORAL | Status: AC
  Administered 2020-07-24: 1000 mg via ORAL
  Filled 2020-07-24: qty 2

## 2020-07-24 MED ORDER — DICLOFENAC SODIUM 1 % EX GEL
2.0000 g | Freq: Once | CUTANEOUS | Status: DC
  Filled 2020-07-24: qty 100

## 2020-07-24 NOTE — Progress Notes (Deleted)
Primary Physician/Referring:  [name], NP  Patient ID: [name], male    DOB: 06-16-1960, 60 y.o.   MRN: [id number]  No chief complaint on file.  HPI:    [name]  is a 60 y.o. African-American male with history of known CAD with subtotal occlusion of OM1 (cath 06/2019 at [facility]), hypertension, family history of premature CAD, hyperlipidemia, HFpEF, diabetes mellitus type 2, history of CVA with right-sided residual deficits, history of polysubstance abuse, obesity.   Patient was hospitalized 06/30/20-07/05/2020 with complaints of chest pain and shortness of breath.He had previously been admitted 06/25/2020 - 06/28/2020 with similar complaints at which time he underwent echocardiogram and nuclear stress test.  Echocardiogram revealed preserved LVEF and stress test was overall low risk.  During most recent hospitalization EKG noted T wave inversions in lateral leads likely consistent with supply demand ischemia in the setting of known subtotal occlusion of OM1 and hypertensive emergency on admission.  During hospitalization guideline directed medical therapy was uptitrated.  ***Patient now presents for follow-up and to establish care with our office.  ***Faxiga?   Past Medical History:  Diagnosis Date  . Arthritis   . Bipolar affective ([place])   . CHF (congestive heart failure) ([place])   . Claustrophobia    Severe  . Depression   . Diabetes mellitus    uncontrolled   . Heart murmur   . Hypertension    Past Surgical History:  Procedure Laterality Date  . BOWEL DECOMPRESSION N/A 01/19/2020   Procedure: BOWEL DECOMPRESSION;  Surgeon: Otis Brace, MD;  Location: [place];  Service: Gastroenterology;  Laterality: N/A;  . COLONOSCOPY WITH PROPOFOL N/A 01/19/2020   Procedure: COLONOSCOPY WITH PROPOFOL;  Surgeon: Otis Brace, MD;  Location: [place];  Service: Gastroenterology;  Laterality: N/A;  . KNEE SURGERY     b/l knees; pending total knee   . RADIOLOGY  WITH ANESTHESIA N/A 07/04/2020   Procedure: MRI WITH ANESTHESIA BRAIN WITHOUT CONTRAST AND HEAD WITHOUT CONTRAST;  Surgeon: Radiologist, Medication, MD;  Location: [place];  Service: Radiology;  Laterality: N/A;   Family History  Problem Relation Age of Onset  . Diabetes Other   . Cancer Mother        uterus; deceased   . Heart attack Mother 32  . Hypertension Other   . Heart attack Father 4  . Stroke Sister        12/2012 age 58 y.o    Social History   Tobacco Use  . Smoking status: Former Research scientist (life sciences)  . Smokeless tobacco: Never Used  Substance Use Topics  . Alcohol use: No   Marital Status: Single   ROS  ***ROS  Objective  There were no vitals taken for this visit.  Vitals with BMI 07/24/2020 07/24/2020 07/24/2020  Height - 5\' 8"  -  Weight - 253 lbs -  BMI - 22.02 -  Systolic 542 - 706  Diastolic 5 - 237  Pulse 85 - 88  Some encounter information is confidential and restricted. Go to Review Flowsheets activity to see all data.      ***Physical Exam Cardiovascular:     Heart sounds: Murmur heard.   Holosystolic murmur is present with a grade of 2/6 at the apex.      Laboratory examination:   Recent Labs    07/02/20 0126 07/03/20 0950 07/11/20 1849  NA 132* 133* 135  K 4.3 4.5 4.8  CL 103 106 107  CO2 25 22 24   GLUCOSE 228* 179* 102*  BUN 20 20 30*  CREATININE 1.61* 1.31* 1.41*  CALCIUM 8.0* 8.1* 8.4*  GFRNONAA 49* >60 58*   estimated creatinine clearance is 70.3 mL/min (A) (by C-G formula based on SCr of 1.41 mg/dL (H)).  CMP Latest Ref Rng & Units 07/11/2020 07/03/2020 07/02/2020  Glucose 70 - 99 mg/dL 102(H) 179(H) 228(H)  BUN 6 - 20 mg/dL 30(H) 20 20  Creatinine 0.61 - 1.24 mg/dL 1.41(H) 1.31(H) 1.61(H)  Sodium 135 - 145 mmol/L 135 133(L) 132(L)  Potassium 3.5 - 5.1 mmol/L 4.8 4.5 4.3  Chloride 98 - 111 mmol/L 107 106 103  CO2 22 - 32 mmol/L 24 22 25   Calcium 8.9 - 10.3 mg/dL 8.4(L) 8.1(L) 8.0(L)  Total Protein 6.5 - 8.1 g/dL 6.6 - -  Total Bilirubin  0.3 - 1.2 mg/dL 0.4 - -  Alkaline Phos 38 - 126 U/L 74 - -  AST 15 - 41 U/L 14(L) - -  ALT 0 - 44 U/L 13 - -   CBC Latest Ref Rng & Units 07/11/2020 07/02/2020 07/01/2020  WBC 4.0 - 10.5 K/uL 3.3(L) 2.8(L) 3.3(L)  Hemoglobin 13.0 - 17.0 g/dL 11.3(L) 10.0(L) 11.1(L)  Hematocrit 39.0 - 52.0 % 36.8(L) 32.6(L) 35.5(L)  Platelets 150 - 400 K/uL 233 230 227    Lipid Panel Recent Labs    01/26/20 0205 06/25/20 0711  CHOL  --  305*  TRIG 71 90  LDLCALC  --  224*  VLDL  --  18  HDL  --  63  CHOLHDL  --  4.8    HEMOGLOBIN A1C Lab Results  Component Value Date   HGBA1C 9.1 (H) 06/25/2020   MPG 214.47 06/25/2020   TSH Recent Labs    01/01/20 0849 01/18/20 0517 06/25/20 1244  TSH 1.354 2.317 1.714    External labs:  None   Allergies   Allergies  Allergen Reactions  . Other Anaphylaxis, Itching and Swelling    Allergy to GRITS , unknown exactly the specific ingredient he is allergic too      Medications Prior to Visit:   Outpatient Medications Prior to Visit  Medication Sig Dispense Refill  . amLODipine (NORVASC) 10 MG tablet Take 1 tablet (10 mg total) by mouth daily. 90 tablet 0  . ammonium lactate (LAC-HYDRIN) 12 % lotion Apply 1 application topically 2 (two) times daily. Both legs    . amoxicillin-clavulanate (AUGMENTIN) 875-125 MG tablet Take 1 tablet by mouth 2 (two) times daily. One po bid x 7 days 14 tablet 0  . aspirin 81 MG chewable tablet Chew 1 tablet (81 mg total) by mouth daily. 90 tablet 0  . atorvastatin (LIPITOR) 80 MG tablet Take 1 tablet (80 mg total) by mouth at bedtime. 90 tablet 0  . carvedilol (COREG) 12.5 MG tablet Take 1 tablet (12.5 mg total) by mouth 2 (two) times daily with a meal. 180 tablet 0  . diclofenac Sodium (VOLTAREN) 1 % GEL Apply 2 g topically 4 (four) times daily for 7 days. 100 g 0  . feeding supplement (ENSURE ENLIVE / ENSURE PLUS) LIQD Take 237 mLs by mouth 2 (two) times daily between meals. 14220 mL 2  . furosemide (LASIX) 80 MG  tablet Take 0.5 tablets (40 mg total) by mouth daily. 45 tablet 0  . insulin aspart (NOVOLOG) 100 UNIT/ML injection Inject 12 Units into the skin 3 (three) times daily before meals. For blood sugars 0-150 give 0 units of insulin, 151-200 give 2 units of insulin, 201-250 give 4 units, 251-300 give  6 units, 301-350 give 8 units, 351-400 give 10 units,> 400 give 12 units and call M.D. (Patient taking differently: Inject 2-12 Units into the skin See admin instructions. Check and record blood sugar before meals and inject per ssi. 151-200 = 2 units  201-250 = 4 units  251-300 = 6 units  301-400 = 10 units  greater than 400 = 12 units and call MD.) 10 mL PRN  . Insulin Glargine (BASAGLAR KWIKPEN) 100 UNIT/ML Inject 30 Units into the skin at bedtime. 27 mL 0  . ondansetron (ZOFRAN ODT) 4 MG disintegrating tablet 4mg  ODT q4 hours prn nausea/vomit 10 tablet 0  . pantoprazole (PROTONIX) 40 MG tablet Take 1 tablet (40 mg total) by mouth daily. 90 tablet 0  . polyethylene glycol (MIRALAX / GLYCOLAX) 17 g packet Take 17 g by mouth daily as needed. (Patient taking differently: Take 17 g by mouth daily as needed for mild constipation.) 100 each 11  . pregabalin (LYRICA) 75 MG capsule Take 1 capsule (75 mg total) by mouth 2 (two) times daily. 180 capsule 0  . sacubitril-valsartan (ENTRESTO) 49-51 MG Take 1 tablet by mouth 2 (two) times daily. 180 tablet 0  . sertraline (ZOLOFT) 50 MG tablet Take 1 tablet (50 mg total) by mouth daily. 90 tablet 0  . tamsulosin (FLOMAX) 0.4 MG CAPS capsule Take 2 capsules (0.8 mg total) by mouth daily. 180 capsule 0   No facility-administered medications prior to visit.     Final Medications at End of Visit    No outpatient medications have been marked as taking for the 07/25/20 encounter (Appointment) with Rayetta Pigg, Willim Turnage C, PA-C.     Radiology:   DG Knee Complete 4 Views Right  Result Date: 07/24/2020 CLINICAL DATA:  Fall, right knee swelling EXAM: RIGHT KNEE -  COMPLETE 4+ VIEW COMPARISON:  None. FINDINGS: No fracture or dislocation is seen. Severe tricompartmental degenerative changes. Old fracture deformity of the patella. Mild prepatellar soft tissue swelling. IMPRESSION: Mild prepatellar soft tissue swelling. No fracture or dislocation is seen. Severe tricompartmental degenerative changes. Electronically Signed   By: Julian Hy M.D.   On: 07/24/2020 05:57   CT of the head for code stroke without contrast 07/02/2020: 1. Evidence of small vessel ischemia in the left caudate and the left thalamus, with the latter more apparent than on a Head CT 1 week ago. 2. No acute cortically based infarct or acute intracranial hemorrhage identified. ASPECTS 10. 3. These results were communicated to Dr. Cheral Marker at [place] 5/8/2022by text page via the [facility] messaging system. Electronically Signed   By: Genevie Ann M.D.   On: 07/02/2020 11:15   Cardiac Studies:  Carotid artery duplex 07/03/2020: Right Carotid: The extracranial vessels were near-normal with only minimal wall thickening or plaque.  Left Carotid: The extracranial vessels were near-normal with only minimal  wallthickening or plaque.  Vertebrals: Bilateral vertebral arteries demonstrate antegrade flow.  Subclavians: Normal flow hemodynamics were seen in bilateral subclavian arteries.Vertebrals:  Bilateral vertebral arteries demonstrate antegrade flow. Subclavians: Normal flow hemodynamics were seen in bilateral subclavian Arteries.  Echocardiogram 06/25/2020: 1. Left ventricular ejection fraction, by estimation, is 60 to 65%. The  left ventricle has normal function. The left ventricle has no regional  wall motion abnormalities. There is severe left ventricular hypertrophy.  Left ventricular diastolic parameters  are consistent with Grade I diastolic dysfunction (impaired relaxation).  2. Right ventricular systolic function is normal. The right ventricular  size is normal. There is moderately elevated  pulmonary  artery systolic  pressure. The estimated right ventricular systolic pressure is 60.7 mmHg.  3. Left atrial size was moderately dilated.  4. A small pericardial effusion is present. The pericardial effusion is  circumferential. There is no evidence of cardiac tamponade.  5. The mitral valve is normal in structure. Mild to moderate mitral valve  regurgitation. No evidence of mitral stenosis.  6. The aortic valve is calcified. There is moderate thickening of the  aortic valve. Aortic valve regurgitation is mild. Mild aortic valve  stenosis. Aortic valve area, by VTI measures 1.26 cm. Aortic valve mean  gradient measures 14.0 mmHg. Aortic valve  Vmax measures 2.48 m/s.  7. The inferior vena cava is dilated in size with <50% respiratory  variability, suggesting right atrial pressure of 15 mmHg.  8. There is a small patent foramen ovale.   Stress Testing Lexiscan 06/27/2020: 1. No reversible ischemia. Probable inferior wall diaphragmatic attenuation.  2. Normal left ventricular wall motion.  3. Left ventricular ejection fraction 50%  4. Non invasive risk stratification*: Low  Left heart catheterization 06/30/2019 ([facility]): Left main is patent.  LAD has mild diffuse irregularity. Diagonal and septal branches are  patent. The left circumflex is a dominant vessel and has mild diffuse  irregularity. Obtuse marginal 1 is a small caliber branch and has  subtotal occlusion. Second obtuse marginal and PLV branches are patent.  Right coronary artery is small and nondominant. It is patent.  Left milligrams performed revealed preserved LV systolic function  left-ventricular ejection fraction 65%.   Plan: Medical therapy for very small caliber subtotal occlusion of OM1 branch.  Aspirin, statin and ACE inhibitor's. Beta-blockers not used due to  history of cocaine use.  Aggressive control of his blood pressure and diabetes.  Rehab for cocaine use.  EKG:    ***  06/30/2020: Normal sinus rhythm, 91 bpm, normal axis, T wave inversions in the high lateral lateral leads suggestive of possible ischemia, without underlying injury pattern. Compared to prior EKG T wave inversions in the lateral leads are new.  07/02/2020: Normal sinus rhythm, 79 bpm, ST-T changes bilaterally. Without underlying injury pattern no significant change compared to prior EKG.  Assessment  No diagnosis found.   There are no discontinued medications.  No orders of the defined types were placed in this encounter.   Recommendations:   [name] is a 60 y.o. African-American male with history of known CAD with subtotal occlusion of OM1 (cath 06/2019 at [facility]), hypertension, family history of premature CAD, hyperlipidemia, HFpEF, diabetes mellitus type 2, history of CVA with right-sided residual deficits, history of polysubstance abuse, obesity.   ***Patient presents to establish care in our office following recent hospitalization for chest pain and shortness of breath.  ***  Patient was seen in collaboration with Dr. Marland Kitchen He also reviewed patient's chart and examined the patient. Dr. Marland Kitchen is in agreement of the plan.   During this visit I reviewed and updated: Tobacco history  allergies medication reconciliation  medical history  surgical history  family history  social history.  This note was created using a voice recognition software as a result there may be grammatical errors inadvertently enclosed that do not reflect the nature of this encounter. Every attempt is made to correct such errors.   Alethia Berthold, PA-C 07/24/2020, 5:04 PM Office: [phone]

## 2020-07-24 NOTE — ED Triage Notes (Signed)
Pt BIB EMS from [facility] facility for a fall that occurred 1600, 05/29. Pt states he fell at his car. Swelling noted to R knee, abrasion to L shin. Rates pain 10/10. Alert and oriented x4  EMS vitals:  BP 182/110 99% RA HR 88

## 2020-07-24 NOTE — ED Provider Notes (Signed)
[facility] EMERGENCY DEPARTMENT Provider Note  CSN: [id number] Arrival date & time: 07/24/20 0451  Chief Complaint(s) Fall  HPI [name] is a 60 y.o. male with a past medical history listed below including CHF, prior stroke with right-sided deficits resulting in patient being wheelchair dependent here for right knee pain following a mechanical fall while trying to transfer from wheelchair into a car.  Patient reports that his legs gave out on him we will try to transition resulting in him falling onto his knees.  He reports feeling immediate pain mostly on the right knee.  Pain has been persistent since onset.  This occurred around 4 PM yesterday afternoon.  Fire/EMS was called who assisted patient back into the vehicle.  He did not want to seek medical evaluation at that time.  Reports that after getting back to his facility he was unable to stand off the toilet prompting the facility staff to call EMS to transport patient so he can be evaluated.  Pain is a moderate to severe aching/throbbing.  Worse with palpation, and movement.  Alleviated by mobility.  Denies any other physical complaints.  HPI  Past Medical History Past Medical History:  Diagnosis Date  . Arthritis   . Bipolar affective ([place])   . CHF (congestive heart failure) ([place])   . Claustrophobia    Severe  . Depression   . Diabetes mellitus    uncontrolled   . Heart murmur   . Hypertension    Patient Active Problem List   Diagnosis Date Noted  . TIA (transient ischemic attack) 07/03/2020  . Acute on chronic heart failure with preserved ejection fraction ([place])   . Pulmonary artery hypertension ([place]) 06/25/2020  . CKD (chronic kidney disease) 06/25/2020  . History of cerebrovascular accident (CVA) with residual deficit 06/25/2020  . Chronic kidney disease, stage 4 (severe) ([place]) 03/30/2020  . Morbid (severe) obesity due to excess calories ([place]) 03/30/2020  . Malnutrition of moderate degree  01/25/2020  . Palliative care by specialist   . Goals of care, counseling/discussion   . Nephrotic syndrome 01/06/2020  . Anasarca 01/06/2020  . Hypokalemia due to loss of potassium 01/04/2020  . Generalized body aches 01/01/2020  . Non-traumatic rhabdomyolysis 01/01/2020  . Grade I diastolic dysfunction 62/13/0865  . Microcytic anemia 01/01/2020  . Hypokalemia 12/31/2019  . Elevated CK   . Chronic diastolic CHF (congestive heart failure) ([place]) 12/31/2017  . Diabetes mellitus type 2, uncontrolled ([place])   . Pure hypercholesterolemia   . Hyperglycemia 09/28/2017  . Nonobstructive atherosclerosis of coronary artery 09/28/2017  . MDD (major depressive disorder), recurrent episode, severe ([place]) 07/30/2017  . Osteoarthritis 04/12/2015  . Severe recurrent major depression with psychotic features ([place]) 01/04/2015  . Substance-related disorder ([place]) 10/30/2014  . Patient's noncompliance with other medical treatment and regimen 06/07/2014  . Cocaine dependence, uncomplicated ([place]) 78/46/9629  . Chronic pain 05/31/2014  . Cocaine abuse ([place]) 05/31/2014  . History of noncompliance with medical treatment 05/31/2014  . Malingering 05/31/2014  . Leukopenia 05/31/2014  . Diabetic neuropathy associated with diabetes mellitus due to underlying condition ([place]) 05/31/2014  . Diabetic neuropathy ([place]) 05/31/2014  . Congestive heart failure with left ventricular systolic dysfunction ([place]) 02/22/2014  . Depression, major, recurrent, severe with psychosis ([place]) 08/29/2013  . Suicidal ideations 08/28/2013  . Uncontrolled diabetes mellitus ([place]) 06/30/2013  . Atypical chest pain 12/09/2011  . Essential hypertension 12/09/2011  . Episodic mood disorder ([place]) 05/13/2011   Home Medication(s) Prior to Admission medications  Medication Sig Start Date End Date Taking? Authorizing Provider  diclofenac Sodium (VOLTAREN) 1 % GEL Apply 2 g topically 4 (four) times daily for 7 days. 07/24/20 07/31/20 Yes Amarianna Abplanalp,  Grayce Sessions, MD  amLODipine (NORVASC) 10 MG tablet Take 1 tablet (10 mg total) by mouth daily. 07/05/20 10/03/20  [place] Donnamarie Poag, DO  ammonium lactate (LAC-HYDRIN) 12 % lotion Apply 1 application topically 2 (two) times daily. Both legs 05/19/20   [provider]  amoxicillin-clavulanate (AUGMENTIN) 875-125 MG tablet Take 1 tablet by mouth 2 (two) times daily. One po bid x 7 days 07/11/20   Drenda Freeze, MD  aspirin 81 MG chewable tablet Chew 1 tablet (81 mg total) by mouth daily. 07/06/20 10/04/20  [place] Donnamarie Poag, DO  atorvastatin (LIPITOR) 80 MG tablet Take 1 tablet (80 mg total) by mouth at bedtime. 07/05/20 10/03/20  [place] Donnamarie Poag, DO  carvedilol (COREG) 12.5 MG tablet Take 1 tablet (12.5 mg total) by mouth 2 (two) times daily with a meal. 07/05/20 10/03/20  [place] Donnamarie Poag, DO  feeding supplement (ENSURE ENLIVE / ENSURE PLUS) LIQD Take 237 mLs by mouth 2 (two) times daily between meals. 06/28/20 09/26/20  Hosie Poisson, MD  furosemide (LASIX) 80 MG tablet Take 0.5 tablets (40 mg total) by mouth daily. 07/05/20 10/03/20  [place] Donnamarie Poag, DO  insulin aspart (NOVOLOG) 100 UNIT/ML injection Inject 12 Units into the skin 3 (three) times daily before meals. For blood sugars 0-150 give 0 units of insulin, 151-200 give 2 units of insulin, 201-250 give 4 units, 251-300 give 6 units, 301-350 give 8 units, 351-400 give 10 units,> 400 give 12 units and call M.D. Patient taking differently: Inject 2-12 Units into the skin See admin instructions. Check and record blood sugar before meals and inject per ssi. 151-200 = 2 units  201-250 = 4 units  251-300 = 6 units  301-400 = 10 units  greater than 400 = 12 units and call MD. 03/30/20   [name], NP  Insulin Glargine (BASAGLAR KWIKPEN) 100 UNIT/ML Inject 30 Units into the skin at bedtime. 07/05/20 10/03/20  [place] Donnamarie Poag, DO  ondansetron (ZOFRAN ODT) 4 MG disintegrating tablet 4mg  ODT q4 hours prn nausea/vomit 07/11/20   Drenda Freeze, MD   pantoprazole (PROTONIX) 40 MG tablet Take 1 tablet (40 mg total) by mouth daily. 07/06/20 10/04/20  [place] Donnamarie Poag, DO  polyethylene glycol (MIRALAX / GLYCOLAX) 17 g packet Take 17 g by mouth daily as needed. Patient taking differently: Take 17 g by mouth daily as needed for mild constipation. 03/30/20   [name], NP  pregabalin (LYRICA) 75 MG capsule Take 1 capsule (75 mg total) by mouth 2 (two) times daily. 07/05/20 10/03/20  [place] Eric J, DO  sacubitril-valsartan (ENTRESTO) 49-51 MG Take 1 tablet by mouth 2 (two) times daily. 07/05/20 10/03/20  [place] Donnamarie Poag, DO  sertraline (ZOLOFT) 50 MG tablet Take 1 tablet (50 mg total) by mouth daily. 07/05/20 10/03/20  [place] Donnamarie Poag, DO  tamsulosin (FLOMAX) 0.4 MG CAPS capsule Take 2 capsules (0.8 mg total) by mouth daily. 07/06/20 10/04/20  [place] Eric J, DO  Past Surgical History Past Surgical History:  Procedure Laterality Date  . BOWEL DECOMPRESSION N/A 01/19/2020   Procedure: BOWEL DECOMPRESSION;  Surgeon: Otis Brace, MD;  [facility];  Service: Gastroenterology;  Laterality: N/A;  . COLONOSCOPY WITH PROPOFOL N/A 01/19/2020   Procedure: COLONOSCOPY WITH PROPOFOL;  Surgeon: Otis Brace, MD;  Location: [place];  Service: Gastroenterology;  Laterality: N/A;  . KNEE SURGERY     b/l knees; pending total knee   . RADIOLOGY WITH ANESTHESIA N/A 07/04/2020   Procedure: MRI WITH ANESTHESIA BRAIN WITHOUT CONTRAST AND HEAD WITHOUT CONTRAST;  Surgeon: Radiologist, Medication, MD;  Location: [place];  Service: Radiology;  Laterality: N/A;   Family History Family History  Problem Relation Age of Onset  . Diabetes Other   . Cancer Mother        uterus; deceased   . Heart attack Mother 16  . Hypertension Other   . Heart attack Father 85  . Stroke Sister        12/2012 age 97 y.o    Social  History Social History   Tobacco Use  . Smoking status: Former Research scientist (life sciences)  . Smokeless tobacco: Never Used  Vaping Use  . Vaping Use: Never used  Substance Use Topics  . Alcohol use: No  . Drug use: Not Currently    Types: Cocaine    Comment: Last use 2019   Allergies Other  Review of Systems Review of Systems All other systems are reviewed and are negative for acute change except as noted in the HPI  Physical Exam Vital Signs  I have reviewed the triage vital signs BP (!) 176/106 (BP Location: Left Arm)   Pulse 88   Temp 98.1 F (36.7 C) (Oral)   Resp 20   Ht 5\' 8"  (1.727 m)   Wt 114.8 kg   SpO2 99%   BMI 38.47 kg/m   Physical Exam Vitals reviewed.  Constitutional:      General: He is not in acute distress.    Appearance: He is well-developed. He is not diaphoretic.  HENT:     Head: Normocephalic and atraumatic.     Jaw: No trismus.     Right Ear: External ear normal.     Left Ear: External ear normal.     Nose: Nose normal.  Eyes:     General: No scleral icterus.    Conjunctiva/sclera: Conjunctivae normal.  Neck:     Trachea: Phonation normal.  Cardiovascular:     Rate and Rhythm: Normal rate and regular rhythm.  Pulmonary:     Effort: Pulmonary effort is normal. No respiratory distress.     Breath sounds: No stridor.  Abdominal:     General: There is no distension.  Musculoskeletal:     Cervical back: Normal range of motion.     Right knee: Swelling present. Decreased range of motion. Tenderness present over the medial joint line and lateral joint line. Normal pulse.     Left knee: Normal pulse.     Right lower leg: 2+ Pitting Edema present.     Left lower leg: 2+ Pitting Edema present.  Neurological:     Mental Status: He is alert and oriented to person, place, and time.  Psychiatric:        Behavior: Behavior normal.     ED Results and Treatments Labs (all labs ordered are listed, but only abnormal results are displayed) Labs Reviewed - No  data to display  EKG  EKG Interpretation  Date/Time:    Ventricular Rate:    PR Interval:    QRS Duration:   QT Interval:    QTC Calculation:   R Axis:     Text Interpretation:        Radiology DG Knee Complete 4 Views Right  Result Date: 07/24/2020 CLINICAL DATA:  Fall, right knee swelling EXAM: RIGHT KNEE - COMPLETE 4+ VIEW COMPARISON:  None. FINDINGS: No fracture or dislocation is seen. Severe tricompartmental degenerative changes. Old fracture deformity of the patella. Mild prepatellar soft tissue swelling. IMPRESSION: Mild prepatellar soft tissue swelling. No fracture or dislocation is seen. Severe tricompartmental degenerative changes. Electronically Signed   By: Julian Hy M.D.   On: 07/24/2020 05:57    Pertinent labs & imaging results that were available during my care of the patient were reviewed by me and considered in my medical decision making (see chart for details).  Medications Ordered in ED Medications  diclofenac Sodium (VOLTAREN) 1 % topical gel 2 g (has no administration in time range)  acetaminophen (TYLENOL) tablet 1,000 mg (1,000 mg Oral Given 07/24/20 0932)                                                                                                                                    Procedures Procedures  (including critical care time)  Medical Decision Making / ED Course I have reviewed the nursing notes for this encounter and the patient's prior records (if available in EHR or on provided paperwork).   [name] was evaluated in Emergency Department on 07/24/2020 for the symptoms described in the history of present illness. He was evaluated in the context of the global COVID-19 pandemic, which necessitated consideration that the patient might be at risk for infection with the SARS-CoV-2 virus that causes COVID-19. Institutional  protocols and algorithms that pertain to the evaluation of patients at risk for COVID-19 are in a state of rapid change based on information released by regulatory bodies including the CDC and federal and state organizations. These policies and algorithms were followed during the patient's care in the ED.  Mechanical fall resulting in right knee pain.  Plain film negative for any fracture or dislocation.  Likely contusion. Provided with ice.  Offered Tylenol.  Voltaren placed.      Final Clinical Impression(s) / ED Diagnoses Final diagnoses:  Right knee pain   The patient appears reasonably screened and/or stabilized for discharge and I doubt any other medical condition or other [facility] requiring further screening, evaluation, or treatment in the ED at this time prior to discharge. Safe for discharge with strict return precautions.  Disposition: Discharge  Condition: Good  I have discussed the results, Dx and Tx plan with the patient/family who expressed understanding and agree(s) with the plan. Discharge instructions discussed at length. The patient/family was given strict return precautions who verbalized understanding of the instructions. No further questions  at time of discharge.    ED Discharge Orders         Ordered    diclofenac Sodium (VOLTAREN) 1 % GEL  4 times daily        07/24/20 2330           Follow Up: [name], NP [id number] [phone]  Call  to schedule an appointment for close follow up, if symptoms do not improve or  worsen, in 5-7 days      This chart was dictated using voice recognition software.  Despite best efforts to proofread,  errors can occur which can change the documentation meaning.   Fatima Blank, MD 07/24/20 [phone]

## 2020-07-24 NOTE — ED Notes (Addendum)
PTAR to bedside, report and required paperwork given to medic.

## 2020-07-24 NOTE — ED Notes (Signed)
Phone report called to [place].

## 2020-07-25 ENCOUNTER — Ambulatory Visit: Payer: Self-pay | Admitting: Student

## 2020-07-25 DIAGNOSIS — Z8249 Family history of ischemic heart disease and other diseases of the circulatory system: Secondary | ICD-10-CM

## 2020-07-25 DIAGNOSIS — I5032 Chronic diastolic (congestive) heart failure: Secondary | ICD-10-CM

## 2020-07-25 DIAGNOSIS — E1165 Type 2 diabetes mellitus with hyperglycemia: Secondary | ICD-10-CM

## 2020-07-25 DIAGNOSIS — I25118 Atherosclerotic heart disease of native coronary artery with other forms of angina pectoris: Secondary | ICD-10-CM

## 2020-07-25 DIAGNOSIS — Z8673 Personal history of transient ischemic attack (TIA), and cerebral infarction without residual deficits: Secondary | ICD-10-CM

## 2020-07-25 DIAGNOSIS — I10 Essential (primary) hypertension: Secondary | ICD-10-CM

## 2020-07-26 ENCOUNTER — Emergency Department (HOSPITAL_COMMUNITY): Payer: Medicare HMO

## 2020-07-26 ENCOUNTER — Emergency Department (HOSPITAL_COMMUNITY)
Admission: EM | Admit: 2020-07-26 | Discharge: 2020-07-27 | Disposition: A | Discharge status: Home / Self Care | Payer: Medicare HMO | Attending: Emergency Medicine | Admitting: Emergency Medicine

## 2020-07-26 ENCOUNTER — Other Ambulatory Visit: Payer: Self-pay

## 2020-07-26 DIAGNOSIS — I5032 Chronic diastolic (congestive) heart failure: Secondary | ICD-10-CM | POA: Insufficient documentation

## 2020-07-26 DIAGNOSIS — Z79899 Other long term (current) drug therapy: Secondary | ICD-10-CM | POA: Diagnosis not present

## 2020-07-26 DIAGNOSIS — R0789 Other chest pain: Secondary | ICD-10-CM | POA: Diagnosis not present

## 2020-07-26 DIAGNOSIS — E1122 Type 2 diabetes mellitus with diabetic chronic kidney disease: Secondary | ICD-10-CM | POA: Insufficient documentation

## 2020-07-26 DIAGNOSIS — R072 Precordial pain: Secondary | ICD-10-CM | POA: Diagnosis present

## 2020-07-26 DIAGNOSIS — I130 Hypertensive heart and chronic kidney disease with heart failure and stage 1 through stage 4 chronic kidney disease, or unspecified chronic kidney disease: Secondary | ICD-10-CM | POA: Insufficient documentation

## 2020-07-26 DIAGNOSIS — R42 Dizziness and giddiness: Secondary | ICD-10-CM | POA: Insufficient documentation

## 2020-07-26 DIAGNOSIS — Z20822 Contact with and (suspected) exposure to covid-19: Secondary | ICD-10-CM | POA: Diagnosis not present

## 2020-07-26 DIAGNOSIS — Z794 Long term (current) use of insulin: Secondary | ICD-10-CM | POA: Diagnosis not present

## 2020-07-26 DIAGNOSIS — Z7982 Long term (current) use of aspirin: Secondary | ICD-10-CM | POA: Insufficient documentation

## 2020-07-26 DIAGNOSIS — D72819 Decreased white blood cell count, unspecified: Secondary | ICD-10-CM | POA: Insufficient documentation

## 2020-07-26 DIAGNOSIS — N184 Chronic kidney disease, stage 4 (severe): Secondary | ICD-10-CM | POA: Diagnosis not present

## 2020-07-26 DIAGNOSIS — I10 Essential (primary) hypertension: Secondary | ICD-10-CM | POA: Diagnosis not present

## 2020-07-26 DIAGNOSIS — Z87891 Personal history of nicotine dependence: Secondary | ICD-10-CM | POA: Insufficient documentation

## 2020-07-26 DIAGNOSIS — Z743 Need for continuous supervision: Secondary | ICD-10-CM | POA: Diagnosis not present

## 2020-07-26 DIAGNOSIS — E1140 Type 2 diabetes mellitus with diabetic neuropathy, unspecified: Secondary | ICD-10-CM | POA: Insufficient documentation

## 2020-07-26 DIAGNOSIS — R112 Nausea with vomiting, unspecified: Secondary | ICD-10-CM | POA: Diagnosis not present

## 2020-07-26 DIAGNOSIS — R0902 Hypoxemia: Secondary | ICD-10-CM | POA: Diagnosis not present

## 2020-07-26 DIAGNOSIS — R079 Chest pain, unspecified: Secondary | ICD-10-CM | POA: Diagnosis not present

## 2020-07-26 DIAGNOSIS — R0602 Shortness of breath: Secondary | ICD-10-CM | POA: Diagnosis not present

## 2020-07-26 LAB — RESP PANEL BY RT-PCR (FLU A&B, COVID) ARPGX2
Influenza A by PCR: NEGATIVE
Influenza B by PCR: NEGATIVE
SARS Coronavirus 2 by RT PCR: NEGATIVE

## 2020-07-26 LAB — CBC WITH DIFFERENTIAL/PLATELET
Abs Immature Granulocytes: 0.01 10*3/uL (ref 0.00–0.07)
Basophils Absolute: 0 10*3/uL (ref 0.0–0.1)
Basophils Relative: 1 %
Eosinophils Absolute: 0.2 10*3/uL (ref 0.0–0.5)
Eosinophils Relative: 6 %
HCT: 34.8 % — ABNORMAL LOW (ref 39.0–52.0)
Hemoglobin: 10.8 g/dL — ABNORMAL LOW (ref 13.0–17.0)
Immature Granulocytes: 0 %
Lymphocytes Relative: 36 %
Lymphs Abs: 1.1 10*3/uL (ref 0.7–4.0)
MCH: 25.3 pg — ABNORMAL LOW (ref 26.0–34.0)
MCHC: 31 g/dL (ref 30.0–36.0)
MCV: 81.5 fL (ref 80.0–100.0)
Monocytes Absolute: 0.4 10*3/uL (ref 0.1–1.0)
Monocytes Relative: 14 %
Neutro Abs: 1.3 10*3/uL — ABNORMAL LOW (ref 1.7–7.7)
Neutrophils Relative %: 43 %
Platelets: 198 10*3/uL (ref 150–400)
RBC: 4.27 MIL/uL (ref 4.22–5.81)
RDW: 14.6 % (ref 11.5–15.5)
WBC: 3.1 10*3/uL — ABNORMAL LOW (ref 4.0–10.5)
nRBC: 0 % (ref 0.0–0.2)

## 2020-07-26 LAB — BASIC METABOLIC PANEL
Anion gap: 4 — ABNORMAL LOW (ref 5–15)
BUN: 17 mg/dL (ref 6–20)
CO2: 25 mmol/L (ref 22–32)
Calcium: 8.6 mg/dL — ABNORMAL LOW (ref 8.9–10.3)
Chloride: 105 mmol/L (ref 98–111)
Creatinine, Ser: 1.34 mg/dL — ABNORMAL HIGH (ref 0.61–1.24)
GFR, Estimated: >60 mL/min (ref >60)
Glucose, Bld: 210 mg/dL — ABNORMAL HIGH (ref 70–99)
Potassium: 4.3 mmol/L (ref 3.5–5.1)
Sodium: 134 mmol/L — ABNORMAL LOW (ref 135–145)

## 2020-07-26 LAB — TROPONIN I (HIGH SENSITIVITY)
Troponin I (High Sensitivity): 18 ng/L — ABNORMAL HIGH (ref <18)
Troponin I (High Sensitivity): 16 ng/L (ref <18)

## 2020-07-26 LAB — BRAIN NATRIURETIC PEPTIDE: B Natriuretic Peptide: 256.9 pg/mL — ABNORMAL HIGH (ref 0.0–100.0)

## 2020-07-26 MED ORDER — MORPHINE SULFATE (PF) 4 MG/ML IV SOLN
4.0000 mg | Freq: Once | INTRAVENOUS | Status: AC
Start: 2020-07-26 — End: 2020-07-26
  Administered 2020-07-26: 4 mg via INTRAVENOUS
  Filled 2020-07-26: qty 1

## 2020-07-26 MED ORDER — CARVEDILOL 12.5 MG PO TABS
12.5000 mg | ORAL_TABLET | Freq: Once | ORAL | Status: AC
  Administered 2020-07-26: 12.5 mg via ORAL
  Filled 2020-07-26: qty 1

## 2020-07-26 NOTE — ED Provider Notes (Signed)
Emergency Medicine Provider Triage Evaluation Note  [name] , a 60 y.o. male  was evaluated in triage.  Pt complains of sob.  Review of Systems  Positive: Cp, sob, fatigue Negative: N/v/d  Physical Exam  There were no vitals taken for this visit. Gen:   Awake, appears uncomfortabl Resp:  Tachypneic, tachycardic MSK:   Moves extremities with decreased strength to R side compare to left Other:  Edema to R arm and BLE  Medical Decision Making  Medically screening exam initiated at 3:52 PM.  Appropriate orders placed.  [name] was informed that the remainder of the evaluation will be completed by another provider, this initial triage assessment does not replace that evaluation, and the importance of remaining in the ED until their evaluation is complete.  Here with cp, sob, weakness, and overall not feeling well.  Was seen a few days ago for a mechanical fall.    Domenic Moras, PA-C 07/26/20 1554    Lucrezia Starch, MD 07/27/20 [phone]

## 2020-07-26 NOTE — ED Triage Notes (Signed)
Pt from [facility] assisted living for sudden onset intermittent chest pain. Endorses abdominal pain n/v. Pt given 324 ASA and 2 nitro without improvement.

## 2020-07-26 NOTE — ED Provider Notes (Signed)
[name] Provider Note   CSN: [id number] Arrival date & time: 07/26/20  1532     History Chief Complaint  Patient presents with  . Chest Pain    [name] is a 60 y.o. male.  60 y.o male with a PMH of CHF, bipolar, diabetes, hypertension, CKD presents to the ED via EMS with a chief complaint of sudden onset of substernal constant sharp squeezing pain with radiation to the left arm. He reports prior episodes in the past but this one being severe. He was given ASA and nitro by EMS with some improvement in his symptoms but no resolution. He also endorses nausea, vomiting and dizziness. He was found to be hypertensive on arrival, reports he has not taken any of his medications since this morning. No prior hx of cardiac disease, does have family hx of cardiac disease. No hx of blood clots.     Chest Pain Pain location:  Substernal area Pain quality: sharp and tightness   Pain radiates to:  L arm Pain severity:  Severe Onset quality:  Sudden Duration:  1 day Timing:  Constant Progression:  Worsening Chronicity:  Recurrent Relieved by:  Aspirin Worsened by:  Deep breathing Ineffective treatments:  Aspirin and nitroglycerin Associated symptoms: dizziness, nausea and vomiting   Associated symptoms: no abdominal pain, no back pain, no fever and no shortness of breath        Past Medical History:  Diagnosis Date  . Arthritis   . Bipolar affective ([place])   . CHF (congestive heart failure) ([place])   . Claustrophobia    Severe  . Depression   . Diabetes mellitus    uncontrolled   . Heart murmur   . Hypertension     Patient Active Problem List   Diagnosis Date Noted  . TIA (transient ischemic attack) 07/03/2020  . Acute on chronic heart failure with preserved ejection fraction ([place])   . Pulmonary artery hypertension ([place]) 06/25/2020  . CKD (chronic kidney disease) 06/25/2020  . History of cerebrovascular accident (CVA) with  residual deficit 06/25/2020  . Chronic kidney disease, stage 4 (severe) ([place]) 03/30/2020  . Morbid (severe) obesity due to excess calories ([place]) 03/30/2020  . Malnutrition of moderate degree 01/25/2020  . Palliative care by specialist   . Goals of care, counseling/discussion   . Nephrotic syndrome 01/06/2020  . Anasarca 01/06/2020  . Hypokalemia due to loss of potassium 01/04/2020  . Generalized body aches 01/01/2020  . Non-traumatic rhabdomyolysis 01/01/2020  . Grade I diastolic dysfunction 79/03/4095  . Microcytic anemia 01/01/2020  . Hypokalemia 12/31/2019  . Elevated CK   . Chronic diastolic CHF (congestive heart failure) ([place]) 12/31/2017  . Diabetes mellitus type 2, uncontrolled ([place])   . Pure hypercholesterolemia   . Hyperglycemia 09/28/2017  . Nonobstructive atherosclerosis of coronary artery 09/28/2017  . MDD (major depressive disorder), recurrent episode, severe ([place]) 07/30/2017  . Osteoarthritis 04/12/2015  . Severe recurrent major depression with psychotic features ([place]) 01/04/2015  . Substance-related disorder ([place]) 10/30/2014  . Patient's noncompliance with other medical treatment and regimen 06/07/2014  . Cocaine dependence, uncomplicated ([place]) 35/32/9924  . Chronic pain 05/31/2014  . Cocaine abuse ([place]) 05/31/2014  . History of noncompliance with medical treatment 05/31/2014  . Malingering 05/31/2014  . Leukopenia 05/31/2014  . Diabetic neuropathy associated with diabetes mellitus due to underlying condition ([place]) 05/31/2014  . Diabetic neuropathy ([place]) 05/31/2014  . Congestive heart failure with left ventricular systolic dysfunction ([place]) 02/22/2014  . Depression, major,  recurrent, severe with psychosis ([place]) 08/29/2013  . Suicidal ideations 08/28/2013  . Uncontrolled diabetes mellitus ([place]) 06/30/2013  . Atypical chest pain 12/09/2011  . Essential hypertension 12/09/2011  . Episodic mood disorder ([place]) 05/13/2011    Past Surgical History:  Procedure  Laterality Date  . BOWEL DECOMPRESSION N/A 01/19/2020   Procedure: BOWEL DECOMPRESSION;  Surgeon: Otis Brace, MD;  Location: [facility];  Service: Gastroenterology;  Laterality: N/A;  . COLONOSCOPY WITH PROPOFOL N/A 01/19/2020   Procedure: COLONOSCOPY WITH PROPOFOL;  Surgeon: Otis Brace, MD;  Location: [facility];  Service: Gastroenterology;  Laterality: N/A;  . KNEE SURGERY     b/l knees; pending total knee   . RADIOLOGY WITH ANESTHESIA N/A 07/04/2020   Procedure: MRI WITH ANESTHESIA BRAIN WITHOUT CONTRAST AND HEAD WITHOUT CONTRAST;  Surgeon: Radiologist, Medication, MD;  Location: [facility];  Service: Radiology;  Laterality: N/A;       Family History  Problem Relation Age of Onset  . Diabetes Other   . Cancer Mother        uterus; deceased   . Heart attack Mother 6  . Hypertension Other   . Heart attack Father 54  . Stroke Sister        12/2012 age 71 y.o    Social History   Tobacco Use  . Smoking status: Former Research scientist (life sciences)  . Smokeless tobacco: Never Used  Vaping Use  . Vaping Use: Never used  Substance Use Topics  . Alcohol use: No  . Drug use: Not Currently    Types: Cocaine    Comment: Last use 2019    Home Medications Prior to Admission medications   Medication Sig Start Date End Date Taking? Authorizing Provider  amLODipine (NORVASC) 10 MG tablet Take 1 tablet (10 mg total) by mouth daily. 07/05/20 10/03/20 Yes [place] Eric J, DO  ammonium lactate (LAC-HYDRIN) 12 % lotion Apply 1 application topically 2 (two) times daily. Both legs 05/19/20  Yes [provider]  aspirin 81 MG chewable tablet Chew 1 tablet (81 mg total) by mouth daily. 07/06/20 10/04/20 Yes [place] Eric J, DO  atorvastatin (LIPITOR) 80 MG tablet Take 1 tablet (80 mg total) by mouth at bedtime. 07/05/20 10/03/20 Yes [place] Eric J, DO  carvedilol (COREG) 12.5 MG tablet Take 1 tablet (12.5 mg total) by mouth 2 (two) times daily with a meal. 07/05/20 10/03/20 Yes [place] Eric J, DO   diclofenac Sodium (VOLTAREN) 1 % GEL Apply 2 g topically 4 (four) times daily for 7 days. 07/24/20 07/31/20 Yes [place], Grayce Sessions, MD  feeding supplement (ENSURE ENLIVE / ENSURE PLUS) LIQD Take 237 mLs by mouth 2 (two) times daily between meals. 06/28/20 09/26/20 Yes Hosie Poisson, MD  furosemide (LASIX) 80 MG tablet Take 0.5 tablets (40 mg total) by mouth daily. 07/05/20 10/03/20 Yes [place] Eric J, DO  insulin aspart (NOVOLOG) 100 UNIT/ML injection Inject 12 Units into the skin 3 (three) times daily before meals. For blood sugars 0-150 give 0 units of insulin, 151-200 give 2 units of insulin, 201-250 give 4 units, 251-300 give 6 units, 301-350 give 8 units, 351-400 give 10 units,> 400 give 12 units and call M.D. Patient taking differently: Inject 2-12 Units into the skin See admin instructions. Check and record blood sugar before meals and inject per ssi. 151-200 = 2 units  201-250 = 4 units  251-300 = 6 units  301-400 = 10 units  greater than 400 = 12 units and call MD. 03/30/20  Yes [name], NP  Insulin Glargine (BASAGLAR KWIKPEN) 100 UNIT/ML Inject 30 Units into the skin at bedtime. 07/05/20 10/03/20 Yes [place] Eric J, DO  ondansetron (ZOFRAN ODT) 4 MG disintegrating tablet 4mg  ODT q4 hours prn nausea/vomit Patient taking differently: Take 4 mg by mouth every 4 (four) hours as needed for nausea or vomiting. 07/11/20  Yes Drenda Freeze, MD  pantoprazole (PROTONIX) 40 MG tablet Take 1 tablet (40 mg total) by mouth daily. 07/06/20 10/04/20 Yes [place] Eric J, DO  polyethylene glycol (MIRALAX / GLYCOLAX) 17 g packet Take 17 g by mouth daily as needed. Patient taking differently: Take 17 g by mouth daily as needed for mild constipation. 03/30/20  Yes [name], NP  pregabalin (LYRICA) 75 MG capsule Take 1 capsule (75 mg total) by mouth 2 (two) times daily. 07/05/20 10/03/20 Yes [place] Eric J, DO  sacubitril-valsartan (ENTRESTO) 49-51 MG Take 1 tablet by mouth 2 (two) times daily.  07/05/20 10/03/20 Yes [place] Eric J, DO  sertraline (ZOLOFT) 50 MG tablet Take 1 tablet (50 mg total) by mouth daily. 07/05/20 10/03/20 Yes [place] Eric J, DO  tamsulosin (FLOMAX) 0.4 MG CAPS capsule Take 2 capsules (0.8 mg total) by mouth daily. 07/06/20 10/04/20 Yes [place] Eric J, DO  amoxicillin-clavulanate (AUGMENTIN) 875-125 MG tablet Take 1 tablet by mouth 2 (two) times daily. One po bid x 7 days 07/11/20   Drenda Freeze, MD    Allergies    Other  Review of Systems   Review of Systems  Constitutional: Negative for chills and fever.  HENT: Negative for sore throat.   Respiratory: Negative for shortness of breath.   Cardiovascular: Positive for chest pain. Negative for leg swelling.  Gastrointestinal: Positive for nausea and vomiting. Negative for abdominal pain.  Genitourinary: Negative for flank pain.  Musculoskeletal: Negative for back pain.  Skin: Negative for pallor and wound.  Neurological: Positive for dizziness.  All other systems reviewed and are negative.   Physical Exam Updated Vital Signs BP (!) 164/101   Pulse 80   Temp 98.4 F (36.9 C) (Oral)   Resp (!) 22   Ht 5\' 8"  (1.727 m)   Wt 114.8 kg   SpO2 100%   BMI 38.47 kg/m   Physical Exam Vitals and nursing note reviewed.  Constitutional:      Appearance: He is well-developed. He is ill-appearing.  HENT:     Head: Normocephalic and atraumatic.  Cardiovascular:     Rate and Rhythm: Normal rate.     Heart sounds: Normal heart sounds.  Pulmonary:     Effort: Pulmonary effort is normal.     Breath sounds: Normal breath sounds. No decreased breath sounds or wheezing.  Chest:     Chest wall: No tenderness.  Abdominal:     Palpations: Abdomen is soft.  Musculoskeletal:     Cervical back: Normal range of motion and neck supple.  Skin:    General: Skin is warm and dry.  Neurological:     Mental Status: He is alert and oriented to person, place, and time.     ED Results / Procedures / Treatments    Labs (all labs ordered are listed, but only abnormal results are displayed) Labs Reviewed  BASIC METABOLIC PANEL - Abnormal; Notable for the following components:      Result Value   Sodium 134 (*)    Glucose, Bld 210 (*)    Creatinine, Ser 1.34 (*)    Calcium 8.6 (*)    Anion gap 4 (*)  All other components within normal limits  CBC WITH DIFFERENTIAL/PLATELET - Abnormal; Notable for the following components:   WBC 3.1 (*)    Hemoglobin 10.8 (*)    HCT 34.8 (*)    MCH 25.3 (*)    Neutro Abs 1.3 (*)    All other components within normal limits  BRAIN NATRIURETIC PEPTIDE - Abnormal; Notable for the following components:   B Natriuretic Peptide 256.9 (*)    All other components within normal limits  TROPONIN I (HIGH SENSITIVITY) - Abnormal; Notable for the following components:   Troponin I (High Sensitivity) 18 (*)    All other components within normal limits  RESP PANEL BY RT-PCR (FLU A&B, COVID) [id number]  TROPONIN I (HIGH SENSITIVITY)    EKG EKG Interpretation  Date/Time:  Wednesday July 26 2020 15:59:04 EDT Ventricular Rate:  84 PR Interval:  154 QRS Duration: 80 QT Interval:  372 QTC Calculation: 439 R Axis:   -8 Text Interpretation: Normal sinus rhythm Anterior infarct , age undetermined Abnormal ECG similar to previous Confirmed by Lavenia Atlas [id number]) on 07/26/2020 10:54:53 PM   Radiology DG Chest 2 View  Result Date: 07/26/2020 CLINICAL DATA:  Short of breath EXAM: CHEST - 2 VIEW COMPARISON:  Jun 30, 2020 FINDINGS: Lungs are clear. Heart mildly enlarged with pulmonary vascularity normal. No adenopathy. Postoperative change noted in the lower cervical spine. IMPRESSION: No edema or airspace opacity.  Stable cardiac enlargement. Electronically Signed   By: Lowella Grip III M.D.   On: 07/26/2020 16:12    Procedures Procedures   Medications Ordered in ED Medications  carvedilol (COREG) tablet 12.5 mg (12.5 mg Oral Given 07/26/20 2121)  morphine 4 MG/ML  injection 4 mg (4 mg Intravenous Given 07/26/20 2122)    ED Course  I have reviewed the triage vital signs and the nursing notes.  Pertinent labs & imaging results that were available during my care of the patient were reviewed by me and considered in my medical decision making (see chart for details).  Clinical Course as of 07/26/20 2351  Wed Jul 26, 2020  2254 SARS Coronavirus 2 by RT PCR: NEGATIVE [JS]  2254 Creatinine(!): 1.34 [JS]  2254 Troponin I (High Sensitivity)(!): 18 [JS]  2254 B Natriuretic Peptide(!): 256.9 [JS]  2343 Troponin I (High Sensitivity): 16 [JS]    Clinical Course User Index [JS] Janeece Fitting, PA-C   MDM Rules/Calculators/A&P   Patient presents to the ED with sudden onset of substernal chest pain with radiation down his left arm, given aspirin along with nitro by EMS.  Prior similar episodes in the past, with cardiac follow-up.  No prior history of CAD, reports a "strong history of CAD ".  Also endorses some abdominal pain with some nausea and vomiting.  Arrived in the ED hemodynamically stable aside from hypertensive, does report he receives medication for his blood pressure at the facility that he is currently on, is unsure whether this was given to him this morning.  During evaluation he is overall non-ill appearing, reports mild improvement after aspirin and nitro.  He also received some morphine to help with pain control with improved his pain.  Interpretation of his labs reveal a CBC with some leukopenia, hemoglobin slightly decreased.  BMP without any electrolyte derangement, creatinine level is at a baseline from prior.  BNP is slightly increased but slightly elevated from his previous.  X-ray without any signs of pulmonary edema.  X-ray of his chest showed: No edema or airspace opacity. Stable cardiac enlargement.  Given his home Coreg 12.5, improvement in blood pressure with a systolic in the 536U.  Satting at 100% on room air.  Extensive chart review by  me along with my attending.  ECHO from 06/25/2020: Left ventricular ejection fraction, by estimation, is 60 to 65%.  Extensive chart review and documentation of Dr. Terri Skains note who stated:  Stress Testing Lexiscan 06/27/2020: 1. No reversible ischemia. Probable inferior wall diaphragmatic attenuation. 2. Normal left ventricular wall motion. 3. Left ventricular ejection fraction 50% 4. Non invasive risk stratification*: Low  Heart Catheterization: Left heart catheterization 06/30/2019 ([facility]): Left main is patent.  LAD has mild diffuse irregularity. Diagonal and septal branches are patent. The left circumflex is a dominant vessel and has mild diffuse irregularity. OM-1 is a small caliber branch and has subtotal occlusion. Second obtuse marginal and PLV branches are patent.  Right coronary artery is small and nondominant. It is patent.  Left milligrams performed revealed preserved LV systolic function  left-ventricular ejection fraction 65%.    Will place call for cardiology to obtain any further recommendations.   11:15 PM Spoke to cardiology on-call who reviewed EKG.  Reports that if troponin remains flat he could likely be discharged due to recent cath.  Patient stable from the cardiac aspect., who reported if troponin is flat, he could likely be discharged.  Delta Trop is flat, he is stable with improvement in symptoms. Return precautions discussed at length.   Portions of this note were generated with [employer]. Dictation errors may occur despite best attempts at proofreading.  Final Clinical Impression(s) / ED Diagnoses Final diagnoses:  Atypical chest pain    Rx / DC Orders ED Discharge Orders    None       Janeece Fitting, PA-C 07/26/20 [place], Alvin Critchley, DO 07/31/20 [phone]

## 2020-07-26 NOTE — Discharge Instructions (Addendum)
Your laboratory results are within normal limits.  You will need to follow-up with your cardiologist as needed.  If you experience any chest pain , shortness of breath, nausea or vomiting please return to the emergency department for reevaluation of symptoms.

## 2020-07-26 NOTE — ED Notes (Signed)
Pt complaints of having chest pain

## 2020-07-27 DIAGNOSIS — I5032 Chronic diastolic (congestive) heart failure: Secondary | ICD-10-CM | POA: Diagnosis not present

## 2020-07-27 DIAGNOSIS — D72819 Decreased white blood cell count, unspecified: Secondary | ICD-10-CM | POA: Diagnosis not present

## 2020-07-27 DIAGNOSIS — I4891 Unspecified atrial fibrillation: Secondary | ICD-10-CM | POA: Diagnosis not present

## 2020-07-27 DIAGNOSIS — Z743 Need for continuous supervision: Secondary | ICD-10-CM | POA: Diagnosis not present

## 2020-07-27 DIAGNOSIS — I69351 Hemiplegia and hemiparesis following cerebral infarction affecting right dominant side: Secondary | ICD-10-CM | POA: Diagnosis not present

## 2020-07-27 DIAGNOSIS — D509 Iron deficiency anemia, unspecified: Secondary | ICD-10-CM | POA: Diagnosis not present

## 2020-07-27 DIAGNOSIS — R279 Unspecified lack of coordination: Secondary | ICD-10-CM | POA: Diagnosis not present

## 2020-07-27 DIAGNOSIS — R69 Illness, unspecified: Secondary | ICD-10-CM | POA: Diagnosis not present

## 2020-07-27 DIAGNOSIS — I130 Hypertensive heart and chronic kidney disease with heart failure and stage 1 through stage 4 chronic kidney disease, or unspecified chronic kidney disease: Secondary | ICD-10-CM | POA: Diagnosis not present

## 2020-07-27 DIAGNOSIS — N182 Chronic kidney disease, stage 2 (mild): Secondary | ICD-10-CM | POA: Diagnosis not present

## 2020-07-27 DIAGNOSIS — E1122 Type 2 diabetes mellitus with diabetic chronic kidney disease: Secondary | ICD-10-CM | POA: Diagnosis not present

## 2020-07-27 DIAGNOSIS — E1165 Type 2 diabetes mellitus with hyperglycemia: Secondary | ICD-10-CM | POA: Diagnosis not present

## 2020-07-27 NOTE — ED Notes (Signed)
PTAR Called 

## 2020-07-28 ENCOUNTER — Telehealth (INDEPENDENT_AMBULATORY_CARE_PROVIDER_SITE_OTHER): Payer: Self-pay | Admitting: Primary Care

## 2020-07-28 NOTE — Telephone Encounter (Signed)
Please follow up

## 2020-07-28 NOTE — Telephone Encounter (Signed)
Copied from [place] [id number]. Topic: Quick Communication - Home Health Verbal Orders >> Jul 28, 2020  2:17 PM Leward Quan A wrote: Caller/Agency: Francee Nodal // [place] Number: [phone] ok to LM Requesting OT/PT/Skilled Nursing/Social Work/Speech Therapy: PT  Frequency: 1 w 1, 2 w 2 & 1 w 2

## 2020-07-28 NOTE — Telephone Encounter (Signed)
Verbal orders provided. ?

## 2020-08-01 DIAGNOSIS — J039 Acute tonsillitis, unspecified: Secondary | ICD-10-CM | POA: Diagnosis not present

## 2020-08-01 DIAGNOSIS — G894 Chronic pain syndrome: Secondary | ICD-10-CM | POA: Diagnosis not present

## 2020-08-02 DIAGNOSIS — Z03818 Encounter for observation for suspected exposure to other biological agents ruled out: Secondary | ICD-10-CM | POA: Diagnosis not present

## 2020-08-03 DIAGNOSIS — I4891 Unspecified atrial fibrillation: Secondary | ICD-10-CM | POA: Diagnosis not present

## 2020-08-03 DIAGNOSIS — E1165 Type 2 diabetes mellitus with hyperglycemia: Secondary | ICD-10-CM | POA: Diagnosis not present

## 2020-08-03 DIAGNOSIS — R69 Illness, unspecified: Secondary | ICD-10-CM | POA: Diagnosis not present

## 2020-08-03 DIAGNOSIS — N182 Chronic kidney disease, stage 2 (mild): Secondary | ICD-10-CM | POA: Diagnosis not present

## 2020-08-03 DIAGNOSIS — E1122 Type 2 diabetes mellitus with diabetic chronic kidney disease: Secondary | ICD-10-CM | POA: Diagnosis not present

## 2020-08-03 DIAGNOSIS — D509 Iron deficiency anemia, unspecified: Secondary | ICD-10-CM | POA: Diagnosis not present

## 2020-08-03 DIAGNOSIS — I130 Hypertensive heart and chronic kidney disease with heart failure and stage 1 through stage 4 chronic kidney disease, or unspecified chronic kidney disease: Secondary | ICD-10-CM | POA: Diagnosis not present

## 2020-08-03 DIAGNOSIS — I69351 Hemiplegia and hemiparesis following cerebral infarction affecting right dominant side: Secondary | ICD-10-CM | POA: Diagnosis not present

## 2020-08-03 DIAGNOSIS — I5032 Chronic diastolic (congestive) heart failure: Secondary | ICD-10-CM | POA: Diagnosis not present

## 2020-08-03 DIAGNOSIS — D72819 Decreased white blood cell count, unspecified: Secondary | ICD-10-CM | POA: Diagnosis not present

## 2020-08-05 ENCOUNTER — Emergency Department (HOSPITAL_COMMUNITY): Payer: Medicare HMO

## 2020-08-05 ENCOUNTER — Other Ambulatory Visit: Payer: Self-pay

## 2020-08-05 ENCOUNTER — Emergency Department (HOSPITAL_COMMUNITY)
Admission: EM | Admit: 2020-08-05 | Discharge: 2020-08-06 | Disposition: A | Discharge status: Home / Self Care | Payer: Medicare HMO | Attending: Emergency Medicine | Admitting: Emergency Medicine

## 2020-08-05 ENCOUNTER — Encounter (HOSPITAL_COMMUNITY): Payer: Self-pay | Admitting: Emergency Medicine

## 2020-08-05 DIAGNOSIS — I5032 Chronic diastolic (congestive) heart failure: Secondary | ICD-10-CM | POA: Diagnosis not present

## 2020-08-05 DIAGNOSIS — R112 Nausea with vomiting, unspecified: Secondary | ICD-10-CM | POA: Insufficient documentation

## 2020-08-05 DIAGNOSIS — Z79899 Other long term (current) drug therapy: Secondary | ICD-10-CM | POA: Insufficient documentation

## 2020-08-05 DIAGNOSIS — R531 Weakness: Secondary | ICD-10-CM | POA: Insufficient documentation

## 2020-08-05 DIAGNOSIS — I130 Hypertensive heart and chronic kidney disease with heart failure and stage 1 through stage 4 chronic kidney disease, or unspecified chronic kidney disease: Secondary | ICD-10-CM | POA: Insufficient documentation

## 2020-08-05 DIAGNOSIS — R1032 Left lower quadrant pain: Secondary | ICD-10-CM | POA: Diagnosis not present

## 2020-08-05 DIAGNOSIS — Z8673 Personal history of transient ischemic attack (TIA), and cerebral infarction without residual deficits: Secondary | ICD-10-CM | POA: Diagnosis not present

## 2020-08-05 DIAGNOSIS — N184 Chronic kidney disease, stage 4 (severe): Secondary | ICD-10-CM | POA: Insufficient documentation

## 2020-08-05 DIAGNOSIS — R110 Nausea: Secondary | ICD-10-CM | POA: Diagnosis not present

## 2020-08-05 DIAGNOSIS — E1122 Type 2 diabetes mellitus with diabetic chronic kidney disease: Secondary | ICD-10-CM | POA: Insufficient documentation

## 2020-08-05 DIAGNOSIS — Z743 Need for continuous supervision: Secondary | ICD-10-CM | POA: Diagnosis not present

## 2020-08-05 DIAGNOSIS — Z87891 Personal history of nicotine dependence: Secondary | ICD-10-CM | POA: Insufficient documentation

## 2020-08-05 DIAGNOSIS — Z794 Long term (current) use of insulin: Secondary | ICD-10-CM | POA: Diagnosis not present

## 2020-08-05 DIAGNOSIS — Z7982 Long term (current) use of aspirin: Secondary | ICD-10-CM | POA: Diagnosis not present

## 2020-08-05 DIAGNOSIS — R072 Precordial pain: Secondary | ICD-10-CM | POA: Diagnosis not present

## 2020-08-05 DIAGNOSIS — R1012 Left upper quadrant pain: Secondary | ICD-10-CM | POA: Diagnosis not present

## 2020-08-05 DIAGNOSIS — R609 Edema, unspecified: Secondary | ICD-10-CM | POA: Diagnosis not present

## 2020-08-05 DIAGNOSIS — T679XXA Effect of heat and light, unspecified, initial encounter: Secondary | ICD-10-CM | POA: Diagnosis not present

## 2020-08-05 DIAGNOSIS — I517 Cardiomegaly: Secondary | ICD-10-CM | POA: Diagnosis not present

## 2020-08-05 DIAGNOSIS — R079 Chest pain, unspecified: Secondary | ICD-10-CM | POA: Diagnosis not present

## 2020-08-05 LAB — COMPREHENSIVE METABOLIC PANEL
ALT: 15 U/L (ref 0–44)
AST: 17 U/L (ref 15–41)
Albumin: 2.5 g/dL — ABNORMAL LOW (ref 3.5–5.0)
Alkaline Phosphatase: 97 U/L (ref 38–126)
Anion gap: 9 (ref 5–15)
BUN: 33 mg/dL — ABNORMAL HIGH (ref 6–20)
CO2: 22 mmol/L (ref 22–32)
Calcium: 8.6 mg/dL — ABNORMAL LOW (ref 8.9–10.3)
Chloride: 105 mmol/L (ref 98–111)
Creatinine, Ser: 1.75 mg/dL — ABNORMAL HIGH (ref 0.61–1.24)
GFR, Estimated: 44 mL/min — ABNORMAL LOW (ref >60)
Glucose, Bld: 219 mg/dL — ABNORMAL HIGH (ref 70–99)
Potassium: 3.3 mmol/L — ABNORMAL LOW (ref 3.5–5.1)
Sodium: 136 mmol/L (ref 135–145)
Total Bilirubin: 0.5 mg/dL (ref 0.3–1.2)
Total Protein: 7 g/dL (ref 6.5–8.1)

## 2020-08-05 LAB — CBC WITH DIFFERENTIAL/PLATELET
Abs Immature Granulocytes: 0.01 10*3/uL (ref 0.00–0.07)
Basophils Absolute: 0 10*3/uL (ref 0.0–0.1)
Basophils Relative: 1 %
Eosinophils Absolute: 0.2 10*3/uL (ref 0.0–0.5)
Eosinophils Relative: 4 %
HCT: 35 % — ABNORMAL LOW (ref 39.0–52.0)
Hemoglobin: 11 g/dL — ABNORMAL LOW (ref 13.0–17.0)
Immature Granulocytes: 0 %
Lymphocytes Relative: 23 %
Lymphs Abs: 1 10*3/uL (ref 0.7–4.0)
MCH: 25.1 pg — ABNORMAL LOW (ref 26.0–34.0)
MCHC: 31.4 g/dL (ref 30.0–36.0)
MCV: 79.9 fL — ABNORMAL LOW (ref 80.0–100.0)
Monocytes Absolute: 0.5 10*3/uL (ref 0.1–1.0)
Monocytes Relative: 12 %
Neutro Abs: 2.5 10*3/uL (ref 1.7–7.7)
Neutrophils Relative %: 60 %
Platelets: 280 10*3/uL (ref 150–400)
RBC: 4.38 MIL/uL (ref 4.22–5.81)
RDW: 14.4 % (ref 11.5–15.5)
WBC: 4.1 10*3/uL (ref 4.0–10.5)
nRBC: 0 % (ref 0.0–0.2)

## 2020-08-05 LAB — LIPASE, BLOOD: Lipase: 20 U/L (ref 11–51)

## 2020-08-05 LAB — TROPONIN I (HIGH SENSITIVITY)
Troponin I (High Sensitivity): 21 ng/L — ABNORMAL HIGH (ref <18)
Troponin I (High Sensitivity): 20 ng/L — ABNORMAL HIGH (ref <18)

## 2020-08-05 MED ORDER — ALUM & MAG HYDROXIDE-SIMETH 200-200-20 MG/5ML PO SUSP
30.0000 mL | Freq: Once | ORAL | Status: AC
  Administered 2020-08-05: 30 mL via ORAL
  Filled 2020-08-05: qty 30

## 2020-08-05 MED ORDER — ONDANSETRON HCL 4 MG/2ML IJ SOLN
4.0000 mg | Freq: Once | INTRAMUSCULAR | Status: AC
  Administered 2020-08-05: 4 mg via INTRAVENOUS
  Filled 2020-08-05: qty 2

## 2020-08-05 MED ORDER — PANTOPRAZOLE SODIUM 40 MG IV SOLR
40.0000 mg | Freq: Once | INTRAVENOUS | Status: AC
  Administered 2020-08-05: 40 mg via INTRAVENOUS
  Filled 2020-08-05: qty 40

## 2020-08-05 NOTE — ED Provider Notes (Signed)
[facility] DEPT Provider Note   CSN: [id number] Arrival date & time: 08/05/20  1829     History Chief Complaint  Patient presents with   Chest Pain   Weakness   Emesis    [name] is a 60 y.o. male.  The history is provided by the patient and medical records.  Chest Pain Associated symptoms: vomiting and weakness   Weakness Associated symptoms: chest pain and vomiting   Emesis [name] is a 60 y.o. male who presents to the Emergency Department complaining of chest pain.  He lives at [facility] and is wheelchair bound.  Today he was sitting at the bus stop in the heat for at least 30 minutes.  He vomited twice (no hematemsis) and developed left sided chest pain described as a gripping pain that radiates to the left arm.  Pain started before vomiting.  Has associated LUQ abdominal pain.  Has associated dizziness and mild headache.    Has chronic sob.  No numbness or weakness.  No urinary changes.  Has lower extremity edema - chronic.     Past Medical History:  Diagnosis Date   Arthritis    Bipolar affective ([place])    CHF (congestive heart failure) ([place])    Claustrophobia    Severe   Depression    Diabetes mellitus    uncontrolled    Heart murmur    Hypertension     Patient Active Problem List   Diagnosis Date Noted   TIA (transient ischemic attack) 07/03/2020   Acute on chronic heart failure with preserved ejection fraction ([place])    Pulmonary artery hypertension ([place]) 06/25/2020   CKD (chronic kidney disease) 06/25/2020   History of cerebrovascular accident (CVA) with residual deficit 06/25/2020   Chronic kidney disease, stage 4 (severe) ([place]) 03/30/2020   Morbid (severe) obesity due to excess calories ([place]) 03/30/2020   Malnutrition of moderate degree 01/25/2020   Palliative care by specialist    Goals of care, counseling/discussion    Nephrotic syndrome 01/06/2020   Anasarca 01/06/2020   Hypokalemia due to loss of potassium  01/04/2020   Generalized body aches 01/01/2020   Non-traumatic rhabdomyolysis 01/01/2020   Grade I diastolic dysfunction 31/54/0086   Microcytic anemia 01/01/2020   Hypokalemia 12/31/2019   Elevated CK    Chronic diastolic CHF (congestive heart failure) ([place]) 12/31/2017   Diabetes mellitus type 2, uncontrolled ([place])    Pure hypercholesterolemia    Hyperglycemia 09/28/2017   Nonobstructive atherosclerosis of coronary artery 09/28/2017   MDD (major depressive disorder), recurrent episode, severe ([place]) 07/30/2017   Osteoarthritis 04/12/2015   Severe recurrent major depression with psychotic features ([place]) 01/04/2015   Substance-related disorder ([place]) 10/30/2014   Patient's noncompliance with other medical treatment and regimen 06/07/2014   Cocaine dependence, uncomplicated ([place]) 76/19/5093   Chronic pain 05/31/2014   Cocaine abuse ([place]) 05/31/2014   History of noncompliance with medical treatment 05/31/2014   Malingering 05/31/2014   Leukopenia 05/31/2014   Diabetic neuropathy associated with diabetes mellitus due to underlying condition ([place]) 05/31/2014   Diabetic neuropathy ([place]) 05/31/2014   Congestive heart failure with left ventricular systolic dysfunction ([place]) 02/22/2014   Depression, major, recurrent, severe with psychosis ([place]) 08/29/2013   Suicidal ideations 08/28/2013   Uncontrolled diabetes mellitus ([place]) 06/30/2013   Atypical chest pain 12/09/2011   Essential hypertension 12/09/2011   Episodic mood disorder ([place]) 05/13/2011    Past Surgical History:  Procedure Laterality Date   BOWEL DECOMPRESSION N/A 01/19/2020   Procedure:  BOWEL DECOMPRESSION;  Surgeon: Otis Brace, MD;  Location: [facility];  Service: Gastroenterology;  Laterality: N/A;   COLONOSCOPY WITH PROPOFOL N/A 01/19/2020   Procedure: COLONOSCOPY WITH PROPOFOL;  Surgeon: Otis Brace, MD;  Location: [facility];  Service: Gastroenterology;  Laterality: N/A;   KNEE SURGERY     b/l knees;  pending total knee    RADIOLOGY WITH ANESTHESIA N/A 07/04/2020   Procedure: MRI WITH ANESTHESIA BRAIN WITHOUT CONTRAST AND HEAD WITHOUT CONTRAST;  Surgeon: Radiologist, Medication, MD;  Location: [facility];  Service: Radiology;  Laterality: N/A;       Family History  Problem Relation Age of Onset   Diabetes Other    Cancer Mother        uterus; deceased    Heart attack Mother 17   Hypertension Other    Heart attack Father 80   Stroke Sister        12/2012 age 10 y.o    Social History   Tobacco Use   Smoking status: Former    Pack years: 0.00   Smokeless tobacco: Never  Vaping Use   Vaping Use: Never used  Substance Use Topics   Alcohol use: No   Drug use: Not Currently    Types: Cocaine    Comment: Last use 2019    Home Medications Prior to Admission medications   Medication Sig Start Date End Date Taking? Authorizing Provider  amLODipine (NORVASC) 10 MG tablet Take 1 tablet (10 mg total) by mouth daily. 07/05/20 10/03/20  [place] Donnamarie Poag, DO  ammonium lactate (LAC-HYDRIN) 12 % lotion Apply 1 application topically 2 (two) times daily. Both legs 05/19/20   [provider]  amoxicillin-clavulanate (AUGMENTIN) 875-125 MG tablet Take 1 tablet by mouth 2 (two) times daily. One po bid x 7 days 07/11/20   Drenda Freeze, MD  aspirin 81 MG chewable tablet Chew 1 tablet (81 mg total) by mouth daily. 07/06/20 10/04/20  [place] Donnamarie Poag, DO  atorvastatin (LIPITOR) 80 MG tablet Take 1 tablet (80 mg total) by mouth at bedtime. 07/05/20 10/03/20  [place] Donnamarie Poag, DO  carvedilol (COREG) 12.5 MG tablet Take 1 tablet (12.5 mg total) by mouth 2 (two) times daily with a meal. 07/05/20 10/03/20  [place] Donnamarie Poag, DO  feeding supplement (ENSURE ENLIVE / ENSURE PLUS) LIQD Take 237 mLs by mouth 2 (two) times daily between meals. 06/28/20 09/26/20  Hosie Poisson, MD  furosemide (LASIX) 80 MG tablet Take 0.5 tablets (40 mg total) by mouth daily. 07/05/20 10/03/20  [place] Donnamarie Poag, DO  insulin aspart (NOVOLOG)  100 UNIT/ML injection Inject 12 Units into the skin 3 (three) times daily before meals. For blood sugars 0-150 give 0 units of insulin, 151-200 give 2 units of insulin, 201-250 give 4 units, 251-300 give 6 units, 301-350 give 8 units, 351-400 give 10 units,> 400 give 12 units and call M.D. Patient taking differently: Inject 2-12 Units into the skin See admin instructions. Check and record blood sugar before meals and inject per ssi. 151-200 = 2 units  201-250 = 4 units  251-300 = 6 units  301-400 = 10 units  greater than 400 = 12 units and call MD. 03/30/20   [name], NP  Insulin Glargine (BASAGLAR KWIKPEN) 100 UNIT/ML Inject 30 Units into the skin at bedtime. 07/05/20 10/03/20  [place] Eric J, DO  ondansetron (ZOFRAN ODT) 4 MG disintegrating tablet 4mg  ODT q4 hours prn nausea/vomit Patient taking differently: Take 4 mg by mouth every 4 (four) hours  as needed for nausea or vomiting. 07/11/20   Drenda Freeze, MD  pantoprazole (PROTONIX) 40 MG tablet Take 1 tablet (40 mg total) by mouth daily. 07/06/20 10/04/20  [place] Donnamarie Poag, DO  polyethylene glycol (MIRALAX / GLYCOLAX) 17 g packet Take 17 g by mouth daily as needed. Patient taking differently: Take 17 g by mouth daily as needed for mild constipation. 03/30/20   [name], NP  pregabalin (LYRICA) 75 MG capsule Take 1 capsule (75 mg total) by mouth 2 (two) times daily. 07/05/20 10/03/20  [place] Eric J, DO  sacubitril-valsartan (ENTRESTO) 49-51 MG Take 1 tablet by mouth 2 (two) times daily. 07/05/20 10/03/20  [place] Donnamarie Poag, DO  sertraline (ZOLOFT) 50 MG tablet Take 1 tablet (50 mg total) by mouth daily. 07/05/20 10/03/20  [place] Donnamarie Poag, DO  tamsulosin (FLOMAX) 0.4 MG CAPS capsule Take 2 capsules (0.8 mg total) by mouth daily. 07/06/20 10/04/20  [place] Eric J, DO    Allergies    Other  Review of Systems   Review of Systems  Cardiovascular:  Positive for chest pain.  Gastrointestinal:  Positive for vomiting.  Neurological:   Positive for weakness.  All other systems reviewed and are negative.  Physical Exam Updated Vital Signs BP (!) 170/99   Pulse 91   Temp 99.1 F (37.3 C) (Oral)   Resp 15   Ht 5\' 8"  (1.727 m)   Wt 114.8 kg   SpO2 97%   BMI 38.47 kg/m   Physical Exam Vitals and nursing note reviewed.  Constitutional:      Appearance: He is well-developed.  HENT:     Head: Normocephalic and atraumatic.  Cardiovascular:     Rate and Rhythm: Normal rate and regular rhythm.     Heart sounds: No murmur heard. Pulmonary:     Effort: Pulmonary effort is normal. No respiratory distress.     Breath sounds: Normal breath sounds.  Abdominal:     Palpations: Abdomen is soft.     Tenderness: There is abdominal tenderness. There is no guarding or rebound.     Comments: Moderate generalized abdominal tenderness, greatest in the LUQ.   Musculoskeletal:        General: Swelling present. No tenderness.     Comments: 2+ DP pulses bilaterally.  1+ pitting edema to BLE.  Chronic venous stasis changes to BLE.  2+ radial pulses bilaterally  Skin:    General: Skin is warm and dry.  Neurological:     Mental Status: He is alert and oriented to person, place, and time.  Psychiatric:        Behavior: Behavior normal.    ED Results / Procedures / Treatments   Labs (all labs ordered are listed, but only abnormal results are displayed) Labs Reviewed  COMPREHENSIVE METABOLIC PANEL - Abnormal; Notable for the following components:      Result Value   Potassium 3.3 (*)    Glucose, Bld 219 (*)    BUN 33 (*)    Creatinine, Ser 1.75 (*)    Calcium 8.6 (*)    Albumin 2.5 (*)    GFR, Estimated 44 (*)    All other components within normal limits  CBC WITH DIFFERENTIAL/PLATELET - Abnormal; Notable for the following components:   Hemoglobin 11.0 (*)    HCT 35.0 (*)    MCV 79.9 (*)    MCH 25.1 (*)    All other components within normal limits  TROPONIN I (HIGH SENSITIVITY) - Abnormal; Notable for the  following  components:   Troponin I (High Sensitivity) 21 (*)    All other components within normal limits  TROPONIN I (HIGH SENSITIVITY) - Abnormal; Notable for the following components:   Troponin I (High Sensitivity) 20 (*)    All other components within normal limits  LIPASE, BLOOD  URINALYSIS, ROUTINE W REFLEX MICROSCOPIC    EKG EKG Interpretation  Date/Time:  Saturday August 05 2020 19:28:13 EDT Ventricular Rate:  94 PR Interval:  177 QRS Duration: 88 QT Interval:  371 QTC Calculation: 464 R Axis:   6 Text Interpretation: Sinus rhythm Consider right atrial enlargement Probable anterior infarct, age indeterminate Lateral leads are also involved 12 Lead; [place] Confirmed by Quintella Reichert [phone]) on 08/05/2020 7:34:46 PM  Radiology DG Chest Port 1 View  Result Date: 08/05/2020 CLINICAL DATA:  Chest pain EXAM: PORTABLE CHEST 1 VIEW COMPARISON:  07/26/2020 FINDINGS: Cardiomegaly. No confluent opacities, effusions or edema. No acute bony abnormality. IMPRESSION: Cardiomegaly.  No active disease. Electronically Signed   By: Rolm Baptise M.D.   On: 08/05/2020 20:18    Procedures Procedures   Medications Ordered in ED Medications  alum & mag hydroxide-simeth (MAALOX/MYLANTA) 200-200-20 MG/5ML suspension 30 mL (has no administration in time range)  pantoprazole (PROTONIX) injection 40 mg (40 mg Intravenous Given 08/05/20 2058)  ondansetron (ZOFRAN) injection 4 mg (4 mg Intravenous Given 08/05/20 2058)    ED Course  I have reviewed the triage vital signs and the nursing notes.  Pertinent labs & imaging results that were available during my care of the patient were reviewed by me and considered in my medical decision making (see chart for details).    MDM Rules/Calculators/A&P                         Patient with history of cardiac disease, CKD here for evaluation of chest pain in the setting of being exposed to the heat with associated nausea and vomiting. He has tenderness to his  abdomen on initial assessment, improved on reassessment. Vomiting has resolved during ED stay and he is tolerating orals on reassessment. Troponins are minimally elevated and flat. Creatinine is similar to his baseline. He has recently had recent cardiac inpatient evaluation with negative stress test. Abdominal pain resolving. Presentation is not consistent with ACS, PE, dissection, pancreatitis, bowel obstruction, perforated viscous. Discussed with patient heat illness, nausea, vomiting - now resolved. Discussed home care, outpatient follow-up.  Final Clinical Impression(s) / ED Diagnoses Final diagnoses:  Precordial pain  LUQ pain  Nausea    Rx / DC Orders ED Discharge Orders     None        Quintella Reichert, MD 08/05/20 2333

## 2020-08-05 NOTE — ED Triage Notes (Signed)
Pt BIB EMS from bus stop c/o chest pain, weakness, nausea, and heat exposure. Hx of CVA with right sided deficit. Severe BLLE edema with wounds. Poor hygiene. Pt lives at [facility] independent living. Wheelchair bound. VSS. CBG 249.

## 2020-08-05 NOTE — ED Notes (Addendum)
Patient refusing PT[facility] due to [facility] not being able to take his electric wheelchair and patient states he cannot move without it. Patient would like to take the bus, but busses are not currently running at this time. Patient to board until transportation can be provided due to patient being non-ambulatory.

## 2020-08-05 NOTE — ED Notes (Signed)
Patient offered a beverage, but he is not drinking it at this time.

## 2020-08-07 ENCOUNTER — Ambulatory Visit: Payer: Medicare HMO | Admitting: Student

## 2020-08-07 ENCOUNTER — Other Ambulatory Visit: Payer: Self-pay

## 2020-08-07 ENCOUNTER — Encounter: Payer: Self-pay | Admitting: Student

## 2020-08-07 VITALS — BP 174/97 | HR 78 | Temp 97.6°F | Resp 16 | Ht 68.0 in | Wt 255.0 lb

## 2020-08-07 DIAGNOSIS — I10 Essential (primary) hypertension: Secondary | ICD-10-CM | POA: Diagnosis not present

## 2020-08-07 DIAGNOSIS — I25118 Atherosclerotic heart disease of native coronary artery with other forms of angina pectoris: Secondary | ICD-10-CM | POA: Diagnosis not present

## 2020-08-07 DIAGNOSIS — Z8673 Personal history of transient ischemic attack (TIA), and cerebral infarction without residual deficits: Secondary | ICD-10-CM

## 2020-08-07 DIAGNOSIS — I5020 Unspecified systolic (congestive) heart failure: Secondary | ICD-10-CM | POA: Diagnosis not present

## 2020-08-07 DIAGNOSIS — E1165 Type 2 diabetes mellitus with hyperglycemia: Secondary | ICD-10-CM

## 2020-08-07 MED ORDER — CARVEDILOL 12.5 MG PO TABS: 25.0000 mg | ORAL_TABLET | Freq: Two times a day (BID) | ORAL | 3 refills | Status: DC

## 2020-08-07 MED ORDER — SPIRONOLACTONE 25 MG PO TABS: 25.0000 mg | ORAL_TABLET | Freq: Every day | ORAL | 1 refills | Status: DC

## 2020-08-07 NOTE — Progress Notes (Signed)
Primary Physician/Referring:  [name], NP  Patient ID: [name], male    DOB: 09-02-1960, 60 y.o.   MRN: [id number]  No chief complaint on file.  HPI:    [name]  is a 60 y.o. African-American male with history of known CAD with subtotal occlusion of OM1 (cath 06/2019 at [facility]), hypertension, family history of premature CAD, hyperlipidemia, HFpEF, diabetes mellitus type 2, history of CVA with right-sided residual deficits, history of polysubstance abuse, obesity.   Patient was hospitalized 06/30/20-07/05/2020 with complaints of chest pain and shortness of breath.He had previously been admitted 06/25/2020 - 06/28/2020 with similar complaints at which time he underwent echocardiogram and nuclear stress test.  Echocardiogram revealed preserved LVEF and stress test was overall low risk.  During most recent hospitalization EKG noted T wave inversions in lateral leads likely consistent with supply demand ischemia in the setting of known subtotal occlusion of OM1 and hypertensive emergency on admission.  During hospitalization guideline directed medical therapy was uptitrated.  Patient now presents for follow-up and to establish care with our office.  Patient lives in a long-term care facility who manages medications, patient is unsure of what he has been taking as he did not send in with paperwork for today's visit.  He reports since recent discharge he continues to have occasional episodes of dizziness which are triggered by exposure to heat.  He states episodes of dizziness are often for by emesis.  He denies chest pain, palpitations, dyspnea, syncope, near syncope.  He admits to significant dietary indiscretion including high salt take.  Patient's blood pressure remains elevated significantly above goal, denies symptoms suggestive of TIA/CVA.  Past Medical History:  Diagnosis Date   Arthritis    Bipolar affective ([place])    CHF (congestive heart failure) ([place])    Claustrophobia     Severe   Depression    Diabetes mellitus    uncontrolled    Heart murmur    Hypertension    Past Surgical History:  Procedure Laterality Date   BOWEL DECOMPRESSION N/A 01/19/2020   Procedure: BOWEL DECOMPRESSION;  Surgeon: Otis Brace, MD;  Location: [place];  Service: Gastroenterology;  Laterality: N/A;   COLONOSCOPY WITH PROPOFOL N/A 01/19/2020   Procedure: COLONOSCOPY WITH PROPOFOL;  Surgeon: Otis Brace, MD;  Location: [place];  Service: Gastroenterology;  Laterality: N/A;   KNEE SURGERY     b/l knees; pending total knee    RADIOLOGY WITH ANESTHESIA N/A 07/04/2020   Procedure: MRI WITH ANESTHESIA BRAIN WITHOUT CONTRAST AND HEAD WITHOUT CONTRAST;  Surgeon: Radiologist, Medication, MD;  Location: [place];  Service: Radiology;  Laterality: N/A;   Family History  Problem Relation Age of Onset   Diabetes Other    Cancer Mother        uterus; deceased    Heart attack Mother 58   Hypertension Other    Heart attack Father 20   Stroke Sister        12/2012 age 64 y.o    Social History   Tobacco Use   Smoking status: Former    Pack years: 0.00   Smokeless tobacco: Never  Substance Use Topics   Alcohol use: No   Marital Status: Single   ROS  Review of Systems  Constitutional: Negative for malaise/fatigue and weight gain.  Cardiovascular:  Positive for leg swelling (chronic and stable). Negative for chest pain, claudication, near-syncope, orthopnea, palpitations, paroxysmal nocturnal dyspnea and syncope.  Respiratory:  Negative for shortness of breath.  Neurological:  Positive for dizziness.   Objective  Blood pressure (!) 174/97, pulse 78, temperature 97.6 F (36.4 C), temperature source Temporal, resp. rate 16, height 5\' 8"  (1.727 m), weight 255 lb (115.7 kg), SpO2 97 %.  Vitals with BMI 08/07/2020 08/07/2020 08/06/2020  Height - 5\' 8"  -  Weight - 255 lbs -  BMI - 00.86 -  Systolic 761 950 932  Diastolic 97 671 245  Pulse 78 86 89  Some encounter  information is confidential and restricted. Go to Review Flowsheets activity to see all data.      Physical Exam Vitals reviewed.  Constitutional:      Appearance: He is obese.  HENT:     Head: Normocephalic and atraumatic.  Cardiovascular:     Rate and Rhythm: Normal rate and regular rhythm.     Pulses: Intact distal pulses.     Heart sounds: S1 normal and S2 normal. Murmur heard.  Holosystolic murmur is present with a grade of 2/6 at the apex.    No gallop.  Pulmonary:     Effort: Pulmonary effort is normal. No respiratory distress.     Breath sounds: No wheezing, rhonchi or rales.  Musculoskeletal:     Right lower leg: Edema (2+ pitting) present.     Left lower leg: Edema (2+ pitting) present.  Skin:    Findings: No erythema or lesion.     Comments: Chronic venous stasis changes bilateral lower legs.   Neurological:     Mental Status: He is alert.     Comments: No new focal deficits  Psychiatric:        Mood and Affect: Mood normal.        Behavior: Behavior normal.    Laboratory examination:   Recent Labs    07/11/20 1849 07/26/20 1552 08/05/20 1932  NA 135 134* 136  K 4.8 4.3 3.3*  CL 107 105 105  CO2 24 25 22   GLUCOSE 102* 210* 219*  BUN 30* 17 33*  CREATININE 1.41* 1.34* 1.75*  CALCIUM 8.4* 8.6* 8.6*  GFRNONAA 58* >60 44*   estimated creatinine clearance is 56.1 mL/min (A) (by C-G formula based on SCr of 1.75 mg/dL (H)).  CMP Latest Ref Rng & Units 08/05/2020 07/26/2020 07/11/2020  Glucose 70 - 99 mg/dL 219(H) 210(H) 102(H)  BUN 6 - 20 mg/dL 33(H) 17 30(H)  Creatinine 0.61 - 1.24 mg/dL 1.75(H) 1.34(H) 1.41(H)  Sodium 135 - 145 mmol/L 136 134(L) 135  Potassium 3.5 - 5.1 mmol/L 3.3(L) 4.3 4.8  Chloride 98 - 111 mmol/L 105 105 107  CO2 22 - 32 mmol/L 22 25 24   Calcium 8.9 - 10.3 mg/dL 8.6(L) 8.6(L) 8.4(L)  Total Protein 6.5 - 8.1 g/dL 7.0 - 6.6  Total Bilirubin 0.3 - 1.2 mg/dL 0.5 - 0.4  Alkaline Phos 38 - 126 U/L 97 - 74  AST 15 - 41 U/L 17 - 14(L)  ALT  0 - 44 U/L 15 - 13   CBC Latest Ref Rng & Units 08/05/2020 07/26/2020 07/11/2020  WBC 4.0 - 10.5 K/uL 4.1 3.1(L) 3.3(L)  Hemoglobin 13.0 - 17.0 g/dL 11.0(L) 10.8(L) 11.3(L)  Hematocrit 39.0 - 52.0 % 35.0(L) 34.8(L) 36.8(L)  Platelets 150 - 400 K/uL 280 198 233    Lipid Panel Recent Labs    01/26/20 0205 06/25/20 0711  CHOL  --  305*  TRIG 71 90  LDLCALC  --  224*  VLDL  --  18  HDL  --  63  CHOLHDL  --  4.8    HEMOGLOBIN A1C Lab Results  Component Value Date   HGBA1C 9.1 (H) 06/25/2020   MPG 214.47 06/25/2020   TSH Recent Labs    01/01/20 0849 01/18/20 0517 06/25/20 1244  TSH 1.354 2.317 1.714    External labs:  None   Allergies   Allergies  Allergen Reactions   Other Anaphylaxis, Itching and Swelling    Allergy to GRITS , unknown exactly the specific ingredient he is allergic too      Medications Prior to Visit:   Outpatient Medications Prior to Visit  Medication Sig Dispense Refill   amLODipine (NORVASC) 10 MG tablet Take 1 tablet (10 mg total) by mouth daily. 90 tablet 0   ammonium lactate (LAC-HYDRIN) 12 % lotion Apply 1 application topically 2 (two) times daily. Both legs     amoxicillin (AMOXIL) 500 MG capsule Take 500 mg by mouth every 12 (twelve) hours. For 5 days     aspirin 81 MG chewable tablet Chew 1 tablet (81 mg total) by mouth daily. 90 tablet 0   atorvastatin (LIPITOR) 80 MG tablet Take 1 tablet (80 mg total) by mouth at bedtime. 90 tablet 0   furosemide (LASIX) 80 MG tablet Take 0.5 tablets (40 mg total) by mouth daily. 45 tablet 0   insulin aspart (NOVOLOG) 100 UNIT/ML injection Inject 12 Units into the skin 3 (three) times daily before meals. For blood sugars 0-150 give 0 units of insulin, 151-200 give 2 units of insulin, 201-250 give 4 units, 251-300 give 6 units, 301-350 give 8 units, 351-400 give 10 units,> 400 give 12 units and call M.D. (Patient taking differently: Inject 2-12 Units into the skin See admin instructions. Check and record  blood sugar before meals and inject per ssi. 151-200 = 2 units  201-250 = 4 units  251-300 = 6 units  301-350 = 8 units  351-400 = 10 units  greater than 400 = 12 units and call MD.) 10 mL PRN   Insulin Glargine (BASAGLAR KWIKPEN) 100 UNIT/ML Inject 30 Units into the skin at bedtime. 27 mL 0   NON FORMULARY ensure drink     ondansetron (ZOFRAN ODT) 4 MG disintegrating tablet 4mg  ODT q4 hours prn nausea/vomit (Patient taking differently: Take 4 mg by mouth every 4 (four) hours as needed for nausea or vomiting.) 10 tablet 0   polyethylene glycol (MIRALAX / GLYCOLAX) 17 g packet Take 17 g by mouth daily as needed. (Patient taking differently: Take 17 g by mouth daily as needed for mild constipation.) 100 each 11   pregabalin (LYRICA) 75 MG capsule Take 1 capsule (75 mg total) by mouth 2 (two) times daily. 180 capsule 0   sacubitril-valsartan (ENTRESTO) 49-51 MG Take 1 tablet by mouth 2 (two) times daily. 180 tablet 0   sertraline (ZOLOFT) 50 MG tablet Take 50 mg by mouth daily.     tamsulosin (FLOMAX) 0.4 MG CAPS capsule Take 2 capsules (0.8 mg total) by mouth daily. 180 capsule 0   carvedilol (COREG) 12.5 MG tablet Take 1 tablet (12.5 mg total) by mouth 2 (two) times daily with a meal. 180 tablet 0   amoxicillin-clavulanate (AUGMENTIN) 875-125 MG tablet Take 1 tablet by mouth 2 (two) times daily. One po bid x 7 days (Patient not taking: Reported on 08/06/2020) 14 tablet 0   Dextrose, Diabetic Use, (INSTA-GLUCOSE PO) Take 1 Dose by mouth as needed (blood sugar <70).     feeding supplement (ENSURE ENLIVE / ENSURE PLUS) LIQD Take 237 mLs  by mouth 2 (two) times daily between meals. 14220 mL 2   furosemide (LASIX) 40 MG tablet Take 40 mg by mouth daily.     insulin aspart (NOVOLOG) 100 UNIT/ML injection Inject 12 Units into the skin 3 (three) times daily before meals.     pantoprazole (PROTONIX) 40 MG tablet Take 1 tablet (40 mg total) by mouth daily. 90 tablet 0   sertraline (ZOLOFT) 50 MG tablet Take  1 tablet (50 mg total) by mouth daily. 90 tablet 0   No facility-administered medications prior to visit.     Final Medications at End of Visit    Current Meds  Medication Sig   amLODipine (NORVASC) 10 MG tablet Take 1 tablet (10 mg total) by mouth daily.   ammonium lactate (LAC-HYDRIN) 12 % lotion Apply 1 application topically 2 (two) times daily. Both legs   amoxicillin (AMOXIL) 500 MG capsule Take 500 mg by mouth every 12 (twelve) hours. For 5 days   aspirin 81 MG chewable tablet Chew 1 tablet (81 mg total) by mouth daily.   atorvastatin (LIPITOR) 80 MG tablet Take 1 tablet (80 mg total) by mouth at bedtime.   furosemide (LASIX) 80 MG tablet Take 0.5 tablets (40 mg total) by mouth daily.   insulin aspart (NOVOLOG) 100 UNIT/ML injection Inject 12 Units into the skin 3 (three) times daily before meals. For blood sugars 0-150 give 0 units of insulin, 151-200 give 2 units of insulin, 201-250 give 4 units, 251-300 give 6 units, 301-350 give 8 units, 351-400 give 10 units,> 400 give 12 units and call M.D. (Patient taking differently: Inject 2-12 Units into the skin See admin instructions. Check and record blood sugar before meals and inject per ssi. 151-200 = 2 units  201-250 = 4 units  251-300 = 6 units  301-350 = 8 units  351-400 = 10 units  greater than 400 = 12 units and call MD.)   Insulin Glargine (BASAGLAR KWIKPEN) 100 UNIT/ML Inject 30 Units into the skin at bedtime.   NON FORMULARY ensure drink   ondansetron (ZOFRAN ODT) 4 MG disintegrating tablet 4mg  ODT q4 hours prn nausea/vomit (Patient taking differently: Take 4 mg by mouth every 4 (four) hours as needed for nausea or vomiting.)   polyethylene glycol (MIRALAX / GLYCOLAX) 17 g packet Take 17 g by mouth daily as needed. (Patient taking differently: Take 17 g by mouth daily as needed for mild constipation.)   pregabalin (LYRICA) 75 MG capsule Take 1 capsule (75 mg total) by mouth 2 (two) times daily.   sacubitril-valsartan  (ENTRESTO) 49-51 MG Take 1 tablet by mouth 2 (two) times daily.   sertraline (ZOLOFT) 50 MG tablet Take 50 mg by mouth daily.   tamsulosin (FLOMAX) 0.4 MG CAPS capsule Take 2 capsules (0.8 mg total) by mouth daily.   [DISCONTINUED] carvedilol (COREG) 12.5 MG tablet Take 1 tablet (12.5 mg total) by mouth 2 (two) times daily with a meal.     Radiology:   No results found.  CT of the head for code stroke without contrast 07/02/2020: 1. Evidence of small vessel ischemia in the left caudate and the left thalamus, with the latter more apparent than on a Head CT 1 week ago. 2. No acute cortically based infarct or acute intracranial hemorrhage identified. ASPECTS 10. 3. These results were communicated to Dr. Cheral Marker at [place] 5/8/2022by text page via the [facility] messaging system. Electronically Signed   By: Genevie Ann M.D.   On: 07/02/2020 11:15  Cardiac Studies:  Carotid artery duplex 07/03/2020: Right Carotid: The extracranial vessels were near-normal with only minimal wall thickening or plaque.  Left Carotid: The extracranial vessels were near-normal with only minimal  wall thickening or plaque.  Vertebrals:  Bilateral vertebral arteries demonstrate antegrade flow.  Subclavians: Normal flow hemodynamics were seen in bilateral subclavian arteries.Vertebrals:  Bilateral vertebral arteries demonstrate antegrade flow. Subclavians: Normal flow hemodynamics were seen in bilateral subclavian Arteries.  Echocardiogram 06/25/2020: 1. Left ventricular ejection fraction, by estimation, is 60 to 65%. The  left ventricle has normal function. The left ventricle has no regional  wall motion abnormalities. There is severe left ventricular hypertrophy.  Left ventricular diastolic parameters   are consistent with Grade I diastolic dysfunction (impaired relaxation).   2. Right ventricular systolic function is normal. The right ventricular  size is normal. There is moderately elevated pulmonary artery systolic   pressure. The estimated right ventricular systolic pressure is 94.7 mmHg.   3. Left atrial size was moderately dilated.   4. A small pericardial effusion is present. The pericardial effusion is  circumferential. There is no evidence of cardiac tamponade.   5. The mitral valve is normal in structure. Mild to moderate mitral valve  regurgitation. No evidence of mitral stenosis.   6. The aortic valve is calcified. There is moderate thickening of the  aortic valve. Aortic valve regurgitation is mild. Mild aortic valve  stenosis. Aortic valve area, by VTI measures 1.26 cm. Aortic valve mean  gradient measures 14.0 mmHg. Aortic valve  Vmax measures 2.48 m/s.   7. The inferior vena cava is dilated in size with <50% respiratory  variability, suggesting right atrial pressure of 15 mmHg.   8. There is a small patent foramen ovale.    Stress Testing Lexiscan 06/27/2020: 1. No reversible ischemia. Probable inferior wall diaphragmatic attenuation.   2. Normal left ventricular wall motion.   3. Left ventricular ejection fraction 50%   4. Non invasive risk stratification*: Low   Left heart catheterization 06/30/2019 ([facility]):  Left main is patent.  LAD has mild diffuse irregularity.  Diagonal and septal branches are  patent.  The left circumflex is a dominant vessel and has mild diffuse  irregularity.  Obtuse marginal 1 is a small caliber branch and has  subtotal occlusion.  Second obtuse marginal and PLV branches are patent.  Right coronary artery is small and nondominant.  It is patent.  Left milligrams performed revealed preserved LV systolic function  left-ventricular ejection fraction 65%.   Plan: Medical therapy for very small caliber subtotal occlusion of OM1 branch.  Aspirin, statin and ACE inhibitor's.  Beta-blockers not used due to  history of cocaine use.  Aggressive control of his blood pressure and diabetes.  Rehab for cocaine use.  EKG:   08/07/2020: Sinus rhythm at a  rate of 84 bpm.  Left atrial enlargement.  Left axis, left anterior fascicular block.  PRWP, cannot exclude anteroseptal infarct old.  Nonspecific T wave abnormality. Previous T wave inversions less prominent.   06/30/2020: Normal sinus rhythm, 91 bpm, normal axis, T wave inversions in the high lateral lateral leads suggestive of possible ischemia, without underlying injury pattern.  Compared to prior EKG T wave inversions in the lateral leads are new.   07/02/2020: Normal sinus rhythm, 79 bpm, ST-T changes bilaterally.  Without underlying injury pattern no significant change compared to prior EKG.  Assessment     ICD-10-CM   1. Congestive heart failure with left ventricular systolic dysfunction ([place])  I50.20 EKG 12-Lead    2. Coronary artery disease of native artery of native heart with stable angina pectoris ([place])  I25.118     3. Essential hypertension  I10     4. History of CVA (cerebrovascular accident)  Z86.73     5. Uncontrolled type 2 diabetes mellitus with hyperglycemia (H[facility])  E11.65        Medications Discontinued During This Encounter  Medication Reason   amoxicillin-clavulanate (AUGMENTIN) 875-125 MG tablet Error   furosemide (LASIX) 40 MG tablet Error   insulin aspart (NOVOLOG) 100 UNIT/ML injection Error   pantoprazole (PROTONIX) 40 MG tablet Error   sertraline (ZOLOFT) 50 MG tablet Error   Dextrose, Diabetic Use, (INSTA-GLUCOSE PO) Error   feeding supplement (ENSURE ENLIVE / ENSURE PLUS) LIQD Error   carvedilol (COREG) 12.5 MG tablet     Meds ordered this encounter  Medications   DISCONTD: carvedilol (COREG) 12.5 MG tablet    Sig: Take 2 tablets (25 mg total) by mouth 2 (two) times daily with a meal.    Dispense:  360 tablet    Refill:  3    Recommendations:   [name] is a 60 y.o. African-American male with history of known CAD with subtotal occlusion of OM1 (cath 06/2019 at [facility]), hypertension, family history of premature CAD, hyperlipidemia, HFpEF,  diabetes mellitus type 2, history of CVA with right-sided residual deficits, history of polysubstance abuse, obesity.   Patient presents to establish care in our office following recent hospitalization for chest pain and shortness of breath.  Currently there are no clinical signs of acute decompensated heart failure.  And patient appears to be tolerating guideline directed medical therapy without issue. In regard to hypertension, patient's blood pressure remains significantly uncontrolled we will therefore add spironolactone 25 mg once daily and increase carvedilol from 12.5 to 25 mg twice daily. No evidence of new focal deficits on today's exam.   Reviewed and discussed with patient regarding results of echocardiogram, nuclear stress test, and carotid artery duplex, details above.  Recommend aggressive medical management of patient's cardiovascular risk factors.  A significant portion of today's visit was spent in care coordination.  Patient is a poor story and therefore I personally did reach out to patient's long-term care facility to do medication reconciliation as well as obtain records.  Also personally reviewed patient's multiple hospitalizations and emergency department visits recently.  Follow up in 4 weeks for hypertension and hypertensive heart disease.    This was a 65-minute encounter with face-to-face counseling, medical records review, coordination of care, explanation of complex medical issues, complex medical decision making.    Alethia Berthold, PA-C 08/08/2020, 5:55 PM Office: [phone]

## 2020-08-09 DIAGNOSIS — E1122 Type 2 diabetes mellitus with diabetic chronic kidney disease: Secondary | ICD-10-CM | POA: Diagnosis not present

## 2020-08-09 DIAGNOSIS — I5032 Chronic diastolic (congestive) heart failure: Secondary | ICD-10-CM | POA: Diagnosis not present

## 2020-08-09 DIAGNOSIS — I130 Hypertensive heart and chronic kidney disease with heart failure and stage 1 through stage 4 chronic kidney disease, or unspecified chronic kidney disease: Secondary | ICD-10-CM | POA: Diagnosis not present

## 2020-08-09 DIAGNOSIS — N182 Chronic kidney disease, stage 2 (mild): Secondary | ICD-10-CM | POA: Diagnosis not present

## 2020-08-09 DIAGNOSIS — E1165 Type 2 diabetes mellitus with hyperglycemia: Secondary | ICD-10-CM | POA: Diagnosis not present

## 2020-08-09 DIAGNOSIS — I69351 Hemiplegia and hemiparesis following cerebral infarction affecting right dominant side: Secondary | ICD-10-CM | POA: Diagnosis not present

## 2020-08-09 DIAGNOSIS — D509 Iron deficiency anemia, unspecified: Secondary | ICD-10-CM | POA: Diagnosis not present

## 2020-08-09 DIAGNOSIS — D72819 Decreased white blood cell count, unspecified: Secondary | ICD-10-CM | POA: Diagnosis not present

## 2020-08-09 DIAGNOSIS — I4891 Unspecified atrial fibrillation: Secondary | ICD-10-CM | POA: Diagnosis not present

## 2020-08-09 DIAGNOSIS — R69 Illness, unspecified: Secondary | ICD-10-CM | POA: Diagnosis not present

## 2020-08-10 ENCOUNTER — Other Ambulatory Visit: Payer: Self-pay | Admitting: *Deleted

## 2020-08-10 NOTE — Patient Outreach (Signed)
[facility]) Care Management  08/10/2020  [name] 1960/07/23 [id number]   Telephone Assessment-Unsuccessful  RN attempted outreach call today however unsuccessful and unable to leave a message.  Will attempt another outreach call next week for ongoing [facility] services.  Raina Mina, RN Care Management Coordinator [place] Office [phone]

## 2020-08-16 ENCOUNTER — Emergency Department (HOSPITAL_COMMUNITY): Payer: Medicare HMO

## 2020-08-16 ENCOUNTER — Emergency Department (HOSPITAL_COMMUNITY)
Admission: EM | Admit: 2020-08-16 | Discharge: 2020-08-16 | Disposition: A | Discharge status: Home / Self Care | Payer: Medicare HMO | Attending: Emergency Medicine | Admitting: Emergency Medicine

## 2020-08-16 ENCOUNTER — Other Ambulatory Visit: Payer: Self-pay

## 2020-08-16 ENCOUNTER — Encounter (HOSPITAL_COMMUNITY): Payer: Self-pay

## 2020-08-16 ENCOUNTER — Other Ambulatory Visit: Payer: Self-pay | Admitting: *Deleted

## 2020-08-16 DIAGNOSIS — I2510 Atherosclerotic heart disease of native coronary artery without angina pectoris: Secondary | ICD-10-CM | POA: Diagnosis not present

## 2020-08-16 DIAGNOSIS — R531 Weakness: Secondary | ICD-10-CM | POA: Diagnosis not present

## 2020-08-16 DIAGNOSIS — R600 Localized edema: Secondary | ICD-10-CM

## 2020-08-16 DIAGNOSIS — Z79899 Other long term (current) drug therapy: Secondary | ICD-10-CM | POA: Diagnosis not present

## 2020-08-16 DIAGNOSIS — Z7982 Long term (current) use of aspirin: Secondary | ICD-10-CM | POA: Diagnosis not present

## 2020-08-16 DIAGNOSIS — E1140 Type 2 diabetes mellitus with diabetic neuropathy, unspecified: Secondary | ICD-10-CM | POA: Diagnosis not present

## 2020-08-16 DIAGNOSIS — N184 Chronic kidney disease, stage 4 (severe): Secondary | ICD-10-CM | POA: Diagnosis not present

## 2020-08-16 DIAGNOSIS — E1165 Type 2 diabetes mellitus with hyperglycemia: Secondary | ICD-10-CM | POA: Diagnosis not present

## 2020-08-16 DIAGNOSIS — Z7401 Bed confinement status: Secondary | ICD-10-CM | POA: Diagnosis not present

## 2020-08-16 DIAGNOSIS — R609 Edema, unspecified: Secondary | ICD-10-CM | POA: Diagnosis not present

## 2020-08-16 DIAGNOSIS — I5030 Unspecified diastolic (congestive) heart failure: Secondary | ICD-10-CM

## 2020-08-16 DIAGNOSIS — I509 Heart failure, unspecified: Secondary | ICD-10-CM | POA: Diagnosis not present

## 2020-08-16 DIAGNOSIS — I10 Essential (primary) hypertension: Secondary | ICD-10-CM | POA: Diagnosis not present

## 2020-08-16 DIAGNOSIS — I4891 Unspecified atrial fibrillation: Secondary | ICD-10-CM | POA: Diagnosis not present

## 2020-08-16 DIAGNOSIS — I5032 Chronic diastolic (congestive) heart failure: Secondary | ICD-10-CM | POA: Diagnosis not present

## 2020-08-16 DIAGNOSIS — Z794 Long term (current) use of insulin: Secondary | ICD-10-CM | POA: Insufficient documentation

## 2020-08-16 DIAGNOSIS — R69 Illness, unspecified: Secondary | ICD-10-CM | POA: Diagnosis not present

## 2020-08-16 DIAGNOSIS — D72819 Decreased white blood cell count, unspecified: Secondary | ICD-10-CM | POA: Diagnosis not present

## 2020-08-16 DIAGNOSIS — I130 Hypertensive heart and chronic kidney disease with heart failure and stage 1 through stage 4 chronic kidney disease, or unspecified chronic kidney disease: Secondary | ICD-10-CM | POA: Diagnosis not present

## 2020-08-16 DIAGNOSIS — R0602 Shortness of breath: Secondary | ICD-10-CM | POA: Diagnosis not present

## 2020-08-16 DIAGNOSIS — Z743 Need for continuous supervision: Secondary | ICD-10-CM | POA: Diagnosis not present

## 2020-08-16 DIAGNOSIS — Z87891 Personal history of nicotine dependence: Secondary | ICD-10-CM | POA: Diagnosis not present

## 2020-08-16 DIAGNOSIS — I110 Hypertensive heart disease with heart failure: Secondary | ICD-10-CM | POA: Diagnosis not present

## 2020-08-16 DIAGNOSIS — I517 Cardiomegaly: Secondary | ICD-10-CM | POA: Diagnosis not present

## 2020-08-16 DIAGNOSIS — D509 Iron deficiency anemia, unspecified: Secondary | ICD-10-CM | POA: Diagnosis not present

## 2020-08-16 DIAGNOSIS — N182 Chronic kidney disease, stage 2 (mild): Secondary | ICD-10-CM | POA: Diagnosis not present

## 2020-08-16 DIAGNOSIS — E1122 Type 2 diabetes mellitus with diabetic chronic kidney disease: Secondary | ICD-10-CM | POA: Diagnosis not present

## 2020-08-16 DIAGNOSIS — I69351 Hemiplegia and hemiparesis following cerebral infarction affecting right dominant side: Secondary | ICD-10-CM | POA: Diagnosis not present

## 2020-08-16 DIAGNOSIS — R5381 Other malaise: Secondary | ICD-10-CM | POA: Diagnosis not present

## 2020-08-16 DIAGNOSIS — M7989 Other specified soft tissue disorders: Secondary | ICD-10-CM | POA: Diagnosis not present

## 2020-08-16 LAB — BASIC METABOLIC PANEL
Anion gap: 7 (ref 5–15)
BUN: 30 mg/dL — ABNORMAL HIGH (ref 6–20)
CO2: 23 mmol/L (ref 22–32)
Calcium: 8.1 mg/dL — ABNORMAL LOW (ref 8.9–10.3)
Chloride: 104 mmol/L (ref 98–111)
Creatinine, Ser: 1.78 mg/dL — ABNORMAL HIGH (ref 0.61–1.24)
GFR, Estimated: 43 mL/min — ABNORMAL LOW (ref >60)
Glucose, Bld: 192 mg/dL — ABNORMAL HIGH (ref 70–99)
Potassium: 3.6 mmol/L (ref 3.5–5.1)
Sodium: 134 mmol/L — ABNORMAL LOW (ref 135–145)

## 2020-08-16 LAB — BRAIN NATRIURETIC PEPTIDE: B Natriuretic Peptide: 245.3 pg/mL — ABNORMAL HIGH (ref 0.0–100.0)

## 2020-08-16 LAB — CBC WITH DIFFERENTIAL/PLATELET
Abs Immature Granulocytes: 0.02 10*3/uL (ref 0.00–0.07)
Basophils Absolute: 0 10*3/uL (ref 0.0–0.1)
Basophils Relative: 1 %
Eosinophils Absolute: 0.2 10*3/uL (ref 0.0–0.5)
Eosinophils Relative: 6 %
HCT: 30.5 % — ABNORMAL LOW (ref 39.0–52.0)
Hemoglobin: 9.2 g/dL — ABNORMAL LOW (ref 13.0–17.0)
Immature Granulocytes: 1 %
Lymphocytes Relative: 28 %
Lymphs Abs: 1.1 10*3/uL (ref 0.7–4.0)
MCH: 24.5 pg — ABNORMAL LOW (ref 26.0–34.0)
MCHC: 30.2 g/dL (ref 30.0–36.0)
MCV: 81.3 fL (ref 80.0–100.0)
Monocytes Absolute: 0.5 10*3/uL (ref 0.1–1.0)
Monocytes Relative: 12 %
Neutro Abs: 2.1 10*3/uL (ref 1.7–7.7)
Neutrophils Relative %: 52 %
Platelets: 244 10*3/uL (ref 150–400)
RBC: 3.75 MIL/uL — ABNORMAL LOW (ref 4.22–5.81)
RDW: 14.7 % (ref 11.5–15.5)
WBC: 4 10*3/uL (ref 4.0–10.5)
nRBC: 0 % (ref 0.0–0.2)

## 2020-08-16 LAB — URINALYSIS, ROUTINE W REFLEX MICROSCOPIC
Bilirubin Urine: NEGATIVE
Glucose, UA: 150 mg/dL — AB
Ketones, ur: NEGATIVE mg/dL
Leukocytes,Ua: NEGATIVE
Nitrite: NEGATIVE
Protein, ur: 100 mg/dL — AB
Specific Gravity, Urine: 1.008 (ref 1.005–1.030)
pH: 6 (ref 5.0–8.0)

## 2020-08-16 MED ORDER — TORSEMIDE 20 MG PO TABS: 40.0000 mg | ORAL_TABLET | Freq: Every day | ORAL | 0 refills | Status: DC

## 2020-08-16 MED ORDER — FUROSEMIDE 10 MG/ML IJ SOLN
40.0000 mg | Freq: Once | INTRAMUSCULAR | Status: AC
  Administered 2020-08-16: 40 mg via INTRAVENOUS
  Filled 2020-08-16: qty 4

## 2020-08-16 NOTE — Discharge Instructions (Signed)
Please stop taking the Lasix you are prescribed.  This will be replaced with medication called torsemide which she will take once daily.  Please keep your appointment with [facility] cardiology 7/11  I strongly recommend minimizing how much salty food you take please decrease fried food, saltine crackers, chips, soups from cans, etc.

## 2020-08-16 NOTE — Patient Outreach (Signed)
[facility]) Care Management  08/16/2020  [name] September 20, 1960 [id number]   Telephone Assessment-Unsuccessful  RN attempted outreach call today however unsuccessful and unable to leave a message. Will send outreach letter due to the last few unsuccessful calls.  Checking Epic pt is currently in the ED. Will follow up accordingly.  Raina Mina, RN Care Management Coordinator [place] Office [phone]

## 2020-08-16 NOTE — ED Triage Notes (Signed)
Pt BIB [facility] from [facility]. Pt states he has had swelling in his legs for months now. Swelling has now moved up into his scrotum. Pt is alert and oriented. Pt states he was started on lasix and it is not helping. Pt states pain is 10/10.

## 2020-08-16 NOTE — ED Provider Notes (Addendum)
[facility] EMERGENCY DEPARTMENT Provider Note   CSN: [id number] Arrival date & time: 08/16/20  1003     History No chief complaint on file.   [name] is a 60 y.o. male.  HPI Patient brought in by [facility] EMS from [facility] which is a assisted living facility of some kind.  Patient has a past medical history significant for CHF with preserved ejection fraction, claustrophobia, depression, DM, HTN  Patient states that he has had 3 months of lower extremity swelling he was placed on Lasix he states he has been taking this as prescribed.  He states that he feels that his swelling has gotten worse progressively over the past month and severely worse over the past few days.  He states that he is now seeming to extend up into his scrotum.  He denies any testicular pain no urinary frequency urgency dysuria or hematuria.  Denies any pain between his legs other than some irritation  He has residual stroke deficits on his right side with some weakness.  He is wheelchair-bound.  Denies any chest pain shortness of breath lightheadedness dizziness fevers or chills.  No cough congestion or any other associated symptoms per patient.     Past Medical History:  Diagnosis Date   Arthritis    Bipolar affective ([place])    CHF (congestive heart failure) ([place])    Claustrophobia    Severe   Depression    Diabetes mellitus    uncontrolled    Heart murmur    Hypertension     Patient Active Problem List   Diagnosis Date Noted   TIA (transient ischemic attack) 07/03/2020   Acute on chronic heart failure with preserved ejection fraction ([place])    Pulmonary artery hypertension ([place]) 06/25/2020   CKD (chronic kidney disease) 06/25/2020   History of cerebrovascular accident (CVA) with residual deficit 06/25/2020   Chronic kidney disease, stage 4 (severe) ([place]) 03/30/2020   Morbid (severe) obesity due to excess calories ([place]) 03/30/2020   Malnutrition of moderate degree  01/25/2020   Palliative care by specialist    Goals of care, counseling/discussion    Nephrotic syndrome 01/06/2020   Anasarca 01/06/2020   Hypokalemia due to loss of potassium 01/04/2020   Generalized body aches 01/01/2020   Non-traumatic rhabdomyolysis 01/01/2020   Grade I diastolic dysfunction 98/92/1194   Microcytic anemia 01/01/2020   Hypokalemia 12/31/2019   Elevated CK    Chronic diastolic CHF (congestive heart failure) ([place]) 12/31/2017   Diabetes mellitus type 2, uncontrolled ([place])    Pure hypercholesterolemia    Hyperglycemia 09/28/2017   Nonobstructive atherosclerosis of coronary artery 09/28/2017   MDD (major depressive disorder), recurrent episode, severe ([place]) 07/30/2017   Osteoarthritis 04/12/2015   Severe recurrent major depression with psychotic features ([place]) 01/04/2015   Substance-related disorder ([place]) 10/30/2014   Patient's noncompliance with other medical treatment and regimen 06/07/2014   Cocaine dependence, uncomplicated ([place]) 17/40/8144   Chronic pain 05/31/2014   Cocaine abuse ([place]) 05/31/2014   History of noncompliance with medical treatment 05/31/2014   Malingering 05/31/2014   Leukopenia 05/31/2014   Diabetic neuropathy associated with diabetes mellitus due to underlying condition ([place]) 05/31/2014   Diabetic neuropathy ([place]) 05/31/2014   Congestive heart failure with left ventricular systolic dysfunction ([place]) 02/22/2014   Depression, major, recurrent, severe with psychosis ([place]) 08/29/2013   Suicidal ideations 08/28/2013   Uncontrolled diabetes mellitus ([place]) 06/30/2013   Atypical chest pain 12/09/2011   Essential hypertension 12/09/2011   Episodic mood disorder (  [place]) 05/13/2011    Past Surgical History:  Procedure Laterality Date   BOWEL DECOMPRESSION N/A 01/19/2020   Procedure: BOWEL DECOMPRESSION;  Surgeon: Otis Brace, MD;  Location: [facility];  Service: Gastroenterology;  Laterality: N/A;   COLONOSCOPY WITH PROPOFOL N/A 01/19/2020    Procedure: COLONOSCOPY WITH PROPOFOL;  Surgeon: Otis Brace, MD;  Location: [facility];  Service: Gastroenterology;  Laterality: N/A;   KNEE SURGERY     b/l knees; pending total knee    RADIOLOGY WITH ANESTHESIA N/A 07/04/2020   Procedure: MRI WITH ANESTHESIA BRAIN WITHOUT CONTRAST AND HEAD WITHOUT CONTRAST;  Surgeon: Radiologist, Medication, MD;  Location: [facility];  Service: Radiology;  Laterality: N/A;       Family History  Problem Relation Age of Onset   Diabetes Other    Cancer Mother        uterus; deceased    Heart attack Mother 53   Hypertension Other    Heart attack Father 54   Stroke Sister        12/2012 age 6 y.o    Social History   Tobacco Use   Smoking status: Former    Pack years: 0.00   Smokeless tobacco: Never  Vaping Use   Vaping Use: Never used  Substance Use Topics   Alcohol use: No   Drug use: Not Currently    Types: Cocaine    Comment: Last use 2019    Home Medications Prior to Admission medications   Medication Sig Start Date End Date Taking? Authorizing Provider  torsemide (DEMADEX) 20 MG tablet Take 2 tablets (40 mg total) by mouth daily. 08/16/20 09/15/20 Yes Nyair Depaulo S, PA  amLODipine (NORVASC) 10 MG tablet Take 1 tablet (10 mg total) by mouth daily. 07/05/20 10/03/20  [place] Donnamarie Poag, DO  ammonium lactate (LAC-HYDRIN) 12 % lotion Apply 1 application topically 2 (two) times daily. Both legs 05/19/20   [provider]  amoxicillin (AMOXIL) 500 MG capsule Take 500 mg by mouth every 12 (twelve) hours. For 5 days    [provider]  aspirin 81 MG chewable tablet Chew 1 tablet (81 mg total) by mouth daily. 07/06/20 10/04/20  [place] Donnamarie Poag, DO  atorvastatin (LIPITOR) 80 MG tablet Take 1 tablet (80 mg total) by mouth at bedtime. 07/05/20 10/03/20  [place] Donnamarie Poag, DO  carvedilol (COREG) 12.5 MG tablet Take 2 tablets (25 mg total) by mouth 2 (two) times daily with a meal. 08/07/20 08/02/21  Cantwell, Celeste C, PA-C  insulin aspart  (NOVOLOG) 100 UNIT/ML injection Inject 12 Units into the skin 3 (three) times daily before meals. For blood sugars 0-150 give 0 units of insulin, 151-200 give 2 units of insulin, 201-250 give 4 units, 251-300 give 6 units, 301-350 give 8 units, 351-400 give 10 units,> 400 give 12 units and call M.D. Patient taking differently: Inject 2-12 Units into the skin See admin instructions. Check and record blood sugar before meals and inject per ssi. 151-200 = 2 units  201-250 = 4 units  251-300 = 6 units  301-350 = 8 units  351-400 = 10 units  greater than 400 = 12 units and call MD. 03/30/20   [name], NP  Insulin Glargine (BASAGLAR KWIKPEN) 100 UNIT/ML Inject 30 Units into the skin at bedtime. 07/05/20 10/03/20  [place] Eric J, DO  NON FORMULARY ensure drink    [provider]  ondansetron (ZOFRAN ODT) 4 MG disintegrating tablet 4mg  ODT q4 hours prn nausea/vomit Patient taking differently: Take 4  mg by mouth every 4 (four) hours as needed for nausea or vomiting. 07/11/20   Drenda Freeze, MD  polyethylene glycol (MIRALAX / GLYCOLAX) 17 g packet Take 17 g by mouth daily as needed. Patient taking differently: Take 17 g by mouth daily as needed for mild constipation. 03/30/20   [name], NP  pregabalin (LYRICA) 75 MG capsule Take 1 capsule (75 mg total) by mouth 2 (two) times daily. 07/05/20 10/03/20  [place] Eric J, DO  sacubitril-valsartan (ENTRESTO) 49-51 MG Take 1 tablet by mouth 2 (two) times daily. 07/05/20 10/03/20  [place] Donnamarie Poag, DO  sertraline (ZOLOFT) 50 MG tablet Take 50 mg by mouth daily.    [provider]  spironolactone (ALDACTONE) 25 MG tablet Take 1 tablet (25 mg total) by mouth daily. 08/07/20   Cantwell, Celeste C, PA-C  tamsulosin (FLOMAX) 0.4 MG CAPS capsule Take 2 capsules (0.8 mg total) by mouth daily. 07/06/20 10/04/20  [place] Eric J, DO    Allergies    Other  Review of Systems   Review of Systems  Constitutional:  Negative for chills and  fever.  HENT:  Negative for congestion.   Eyes:  Negative for pain.  Respiratory:  Negative for cough and shortness of breath.   Cardiovascular:  Positive for leg swelling. Negative for chest pain.       Swelling up to the groin/scrotum  Gastrointestinal:  Negative for abdominal pain and vomiting.  Genitourinary:  Negative for dysuria.  Musculoskeletal:  Negative for myalgias.  Skin:  Negative for rash.  Neurological:  Negative for dizziness and headaches.   Physical Exam Updated Vital Signs BP (!) 168/96   Pulse 77   Temp (!) 97.2 F (36.2 C) (Oral)   Resp (!) 9   SpO2 100%   Physical Exam Vitals and nursing note reviewed.  Constitutional:      General: He is not in acute distress.    Appearance: He is obese.     Comments: Chronically ill-appearing 60 year old male in no acute distress  HENT:     Head: Normocephalic and atraumatic.     Nose: Nose normal.  Eyes:     General: No scleral icterus. Cardiovascular:     Rate and Rhythm: Normal rate and regular rhythm.     Pulses: Normal pulses.     Heart sounds: Normal heart sounds.  Pulmonary:     Effort: Pulmonary effort is normal. No respiratory distress.     Breath sounds: No wheezing.  Abdominal:     Palpations: Abdomen is soft.     Tenderness: There is no abdominal tenderness. There is no guarding or rebound.  Genitourinary:    Comments: No perineal tenderness to palpation.  There is some fecal matter here and some irritated skin No evidence of foreign years no crepitance no redness or erythema. Musculoskeletal:     Cervical back: Normal range of motion.     Right lower leg: Edema present.     Left lower leg: Edema present.     Comments: Bilateral lower extremity edema with 3+ to the knees and 2+ to the mid thigh with 1+ edema extending into his hip pelvis area with some mild scrotal edema.  Skin:    General: Skin is warm and dry.     Capillary Refill: Capillary refill takes less than 2 seconds.  Neurological:      Mental Status: He is alert. Mental status is at baseline.  Psychiatric:        Mood  and Affect: Mood normal.        Behavior: Behavior normal.    ED Results / Procedures / Treatments   Labs (all labs ordered are listed, but only abnormal results are displayed) Labs Reviewed  CBC WITH DIFFERENTIAL/PLATELET - Abnormal; Notable for the following components:      Result Value   RBC 3.75 (*)    Hemoglobin 9.2 (*)    HCT 30.5 (*)    MCH 24.5 (*)    All other components within normal limits  BRAIN NATRIURETIC PEPTIDE - Abnormal; Notable for the following components:   B Natriuretic Peptide 245.3 (*)    All other components within normal limits  BASIC METABOLIC PANEL - Abnormal; Notable for the following components:   Sodium 134 (*)    Glucose, Bld 192 (*)    BUN 30 (*)    Creatinine, Ser 1.78 (*)    Calcium 8.1 (*)    GFR, Estimated 43 (*)    All other components within normal limits  URINALYSIS, ROUTINE W REFLEX MICROSCOPIC - Abnormal; Notable for the following components:   Color, Urine STRAW (*)    Glucose, UA 150 (*)    Hgb urine dipstick SMALL (*)    Protein, ur 100 (*)    Bacteria, UA RARE (*)    All other components within normal limits    EKG None  Radiology DG Chest Portable 1 View  Result Date: 08/16/2020 CLINICAL DATA:  Shortness of breath EXAM: PORTABLE CHEST 1 VIEW COMPARISON:  08/05/2020 FINDINGS: Stable cardiomegaly. Mild streaky bibasilar opacities, right slightly greater than left. No overt edema. No significant pleural fluid collection evident on semi upright view. No pneumothorax. IMPRESSION: Cardiomegaly with mild streaky bibasilar opacities, right slightly greater than left, may reflect atelectasis versus infiltrate. Electronically Signed   By: Davina Poke D.O.   On: 08/16/2020 11:07    Procedures Procedures   Medications Ordered in ED Medications  furosemide (LASIX) injection 40 mg (40 mg Intravenous Given 08/16/20 1418)    ED Course  I have  reviewed the triage vital signs and the nursing notes.  Pertinent labs & imaging results that were available during my care of the patient were reviewed by me and considered in my medical decision making (see chart for details).  Clinical Course as of 08/16/20 1810  Wed Aug 16, 2020  1128 DG Chest Portable 1 View Chest x-ray with evidence of atelectasis.  Radiology interpreted as atelectasis versus infiltrate.  Patient is afebrile not really complaining of any shortness of breath so much that his bilateral leg swelling is not coughing no fevers no expectoration.  Suspect atelectasis versus pulmonary edema [WF]  1129 CBC without leukocytosis some anemia present likely secondary to fluid overload with delusional anemia. [WF]  1340 Discussed with Dr. Christen Butter given patient has worsening swelling on Lasix and seems to have increasing creatinine.  Recommendation from Dr. Einar Gip is to start Lasix 40 mg once daily and to keep patient's appointment on 11 July with [facility] cardiology. [WF]    Clinical Course User Index [WF] Tedd Sias, [place]   MDM Rules/Calculators/A&P                          Patient is 60 year old male presented today with worsening lower extremity edema.  It is symmetric bilateral and seems to be associated with progressive worsening of his edema.  He has had a recent heart evaluation here in the ER he was  admitted approximately 1 month ago and had an echo with preserved EF he is also recently been started on Lasix and is on spironolactone as well.  Given that his creatinine has crept up some slightly over the past month or 2 and he appears to be fluid overloaded discussed with cardiology Dr. Einar Gip who recommended switching to torsemide 40 mg once daily and follow-up with his appointment in July he is already scheduled for.  I discussed this case with my attending physician who cosigned this note including patient's presenting symptoms, physical exam, and planned diagnostics and  interventions. Attending physician stated agreement with plan or made changes to plan which were implemented.   Patient agreeable to plan.  Significant urine output with 40 IV Lasix given here in the ER.  We will begin taking torsemide tomorrow morning.   Final Clinical Impression(s) / ED Diagnoses Final diagnoses:  Dependent edema  Bilateral lower extremity edema  Heart failure with preserved ejection fraction, unspecified HF chronicity ([place])    Rx / DC Orders ED Discharge Orders          Ordered    torsemide (DEMADEX) 20 MG tablet  Daily        08/16/20 1344             Pati Gallo [place], [place] 08/16/20 1625  Addendum at this time to include the information that as patient is waiting for PT for discharge he has had 1.8 L of urine output.     Pati Gallo [place], [place] 08/16/20 1810    Fredia Sorrow, MD 08/24/20 1347

## 2020-08-17 ENCOUNTER — Other Ambulatory Visit: Payer: Self-pay

## 2020-08-17 ENCOUNTER — Emergency Department (HOSPITAL_COMMUNITY): Payer: Medicare HMO

## 2020-08-17 ENCOUNTER — Encounter (HOSPITAL_COMMUNITY): Payer: Self-pay | Admitting: Emergency Medicine

## 2020-08-17 DIAGNOSIS — Z794 Long term (current) use of insulin: Secondary | ICD-10-CM | POA: Insufficient documentation

## 2020-08-17 DIAGNOSIS — R609 Edema, unspecified: Secondary | ICD-10-CM | POA: Diagnosis not present

## 2020-08-17 DIAGNOSIS — I5032 Chronic diastolic (congestive) heart failure: Secondary | ICD-10-CM | POA: Insufficient documentation

## 2020-08-17 DIAGNOSIS — R0600 Dyspnea, unspecified: Secondary | ICD-10-CM | POA: Diagnosis not present

## 2020-08-17 DIAGNOSIS — E1122 Type 2 diabetes mellitus with diabetic chronic kidney disease: Secondary | ICD-10-CM | POA: Diagnosis not present

## 2020-08-17 DIAGNOSIS — I130 Hypertensive heart and chronic kidney disease with heart failure and stage 1 through stage 4 chronic kidney disease, or unspecified chronic kidney disease: Secondary | ICD-10-CM | POA: Insufficient documentation

## 2020-08-17 DIAGNOSIS — I10 Essential (primary) hypertension: Secondary | ICD-10-CM | POA: Diagnosis not present

## 2020-08-17 DIAGNOSIS — Z79899 Other long term (current) drug therapy: Secondary | ICD-10-CM | POA: Insufficient documentation

## 2020-08-17 DIAGNOSIS — Z87891 Personal history of nicotine dependence: Secondary | ICD-10-CM | POA: Insufficient documentation

## 2020-08-17 DIAGNOSIS — R1900 Intra-abdominal and pelvic swelling, mass and lump, unspecified site: Secondary | ICD-10-CM | POA: Diagnosis not present

## 2020-08-17 DIAGNOSIS — N492 Inflammatory disorders of scrotum: Secondary | ICD-10-CM | POA: Insufficient documentation

## 2020-08-17 DIAGNOSIS — Z7982 Long term (current) use of aspirin: Secondary | ICD-10-CM | POA: Diagnosis not present

## 2020-08-17 DIAGNOSIS — E1140 Type 2 diabetes mellitus with diabetic neuropathy, unspecified: Secondary | ICD-10-CM | POA: Diagnosis not present

## 2020-08-17 DIAGNOSIS — Z743 Need for continuous supervision: Secondary | ICD-10-CM | POA: Diagnosis not present

## 2020-08-17 DIAGNOSIS — N433 Hydrocele, unspecified: Secondary | ICD-10-CM | POA: Diagnosis not present

## 2020-08-17 DIAGNOSIS — N184 Chronic kidney disease, stage 4 (severe): Secondary | ICD-10-CM | POA: Diagnosis not present

## 2020-08-17 DIAGNOSIS — K5641 Fecal impaction: Secondary | ICD-10-CM | POA: Diagnosis not present

## 2020-08-17 DIAGNOSIS — R1030 Lower abdominal pain, unspecified: Secondary | ICD-10-CM | POA: Diagnosis not present

## 2020-08-17 DIAGNOSIS — B356 Tinea cruris: Secondary | ICD-10-CM | POA: Diagnosis not present

## 2020-08-17 DIAGNOSIS — I517 Cardiomegaly: Secondary | ICD-10-CM | POA: Diagnosis not present

## 2020-08-17 DIAGNOSIS — R1084 Generalized abdominal pain: Secondary | ICD-10-CM | POA: Diagnosis not present

## 2020-08-17 DIAGNOSIS — K8020 Calculus of gallbladder without cholecystitis without obstruction: Secondary | ICD-10-CM | POA: Diagnosis not present

## 2020-08-17 NOTE — ED Triage Notes (Signed)
Patient arrives via EMS from [facility] for groin and leg swelling. Patient had a new diuretic added yesterday that he has taken and has not helped.

## 2020-08-17 NOTE — ED Provider Notes (Signed)
Emergency Medicine Provider Triage Evaluation Note  [name] , a 59 y.o. male  was evaluated in triage.  Pt complains of groin swelling for the past few days, brought in by EMS from facility. Seen here yesterday for same, taking Lasix as prescribed.  Review of Systems  Positive: Groin swelling, SHOB Negative: fever  Physical Exam  There were no vitals taken for this visit. Gen:   Awake, no distress   Resp:  Mildly increased effort MSK:   Moves extremities without difficulty  Other:    Medical Decision Making  Medically screening exam initiated at 11:39 PM.  Appropriate orders placed.  [name] was informed that the remainder of the evaluation will be completed by another provider, this initial triage assessment does not replace that evaluation, and the importance of remaining in the ED until their evaluation is complete.     Roy Learn, PA-C 08/17/20 [name]    Roy Leigh, MD 08/19/20 [id number]

## 2020-08-18 ENCOUNTER — Emergency Department (HOSPITAL_COMMUNITY)
Admission: EM | Admit: 2020-08-18 | Discharge: 2020-08-18 | Disposition: A | Discharge status: Home / Self Care | Payer: Medicare HMO | Attending: Emergency Medicine | Admitting: Emergency Medicine

## 2020-08-18 ENCOUNTER — Emergency Department (HOSPITAL_COMMUNITY): Payer: Medicare HMO

## 2020-08-18 DIAGNOSIS — R609 Edema, unspecified: Secondary | ICD-10-CM | POA: Diagnosis not present

## 2020-08-18 DIAGNOSIS — K8020 Calculus of gallbladder without cholecystitis without obstruction: Secondary | ICD-10-CM | POA: Diagnosis not present

## 2020-08-18 DIAGNOSIS — K5641 Fecal impaction: Secondary | ICD-10-CM | POA: Diagnosis not present

## 2020-08-18 DIAGNOSIS — N5089 Other specified disorders of the male genital organs: Secondary | ICD-10-CM

## 2020-08-18 DIAGNOSIS — Z743 Need for continuous supervision: Secondary | ICD-10-CM | POA: Diagnosis not present

## 2020-08-18 DIAGNOSIS — R1900 Intra-abdominal and pelvic swelling, mass and lump, unspecified site: Secondary | ICD-10-CM | POA: Diagnosis not present

## 2020-08-18 DIAGNOSIS — R531 Weakness: Secondary | ICD-10-CM | POA: Diagnosis not present

## 2020-08-18 DIAGNOSIS — N492 Inflammatory disorders of scrotum: Secondary | ICD-10-CM | POA: Diagnosis not present

## 2020-08-18 DIAGNOSIS — N433 Hydrocele, unspecified: Secondary | ICD-10-CM | POA: Diagnosis not present

## 2020-08-18 DIAGNOSIS — B356 Tinea cruris: Secondary | ICD-10-CM

## 2020-08-18 DIAGNOSIS — I10 Essential (primary) hypertension: Secondary | ICD-10-CM | POA: Diagnosis not present

## 2020-08-18 LAB — CBC WITH DIFFERENTIAL/PLATELET
Abs Immature Granulocytes: 0.02 10*3/uL (ref 0.00–0.07)
Basophils Absolute: 0 10*3/uL (ref 0.0–0.1)
Basophils Relative: 1 %
Eosinophils Absolute: 0.3 10*3/uL (ref 0.0–0.5)
Eosinophils Relative: 6 %
HCT: 31.5 % — ABNORMAL LOW (ref 39.0–52.0)
Hemoglobin: 9.8 g/dL — ABNORMAL LOW (ref 13.0–17.0)
Immature Granulocytes: 1 %
Lymphocytes Relative: 34 %
Lymphs Abs: 1.4 10*3/uL (ref 0.7–4.0)
MCH: 24.9 pg — ABNORMAL LOW (ref 26.0–34.0)
MCHC: 31.1 g/dL (ref 30.0–36.0)
MCV: 79.9 fL — ABNORMAL LOW (ref 80.0–100.0)
Monocytes Absolute: 0.6 10*3/uL (ref 0.1–1.0)
Monocytes Relative: 15 %
Neutro Abs: 1.8 10*3/uL (ref 1.7–7.7)
Neutrophils Relative %: 43 %
Platelets: 245 10*3/uL (ref 150–400)
RBC: 3.94 MIL/uL — ABNORMAL LOW (ref 4.22–5.81)
RDW: 14.9 % (ref 11.5–15.5)
WBC: 4.1 10*3/uL (ref 4.0–10.5)
nRBC: 0 % (ref 0.0–0.2)

## 2020-08-18 LAB — BASIC METABOLIC PANEL
Anion gap: 10 (ref 5–15)
BUN: 35 mg/dL — ABNORMAL HIGH (ref 6–20)
CO2: 23 mmol/L (ref 22–32)
Calcium: 8.6 mg/dL — ABNORMAL LOW (ref 8.9–10.3)
Chloride: 103 mmol/L (ref 98–111)
Creatinine, Ser: 1.51 mg/dL — ABNORMAL HIGH (ref 0.61–1.24)
GFR, Estimated: 53 mL/min — ABNORMAL LOW (ref >60)
Glucose, Bld: 147 mg/dL — ABNORMAL HIGH (ref 70–99)
Potassium: 3.5 mmol/L (ref 3.5–5.1)
Sodium: 136 mmol/L (ref 135–145)

## 2020-08-18 LAB — BRAIN NATRIURETIC PEPTIDE: B Natriuretic Peptide: 139.2 pg/mL — ABNORMAL HIGH (ref 0.0–100.0)

## 2020-08-18 MED ORDER — SODIUM CHLORIDE (PF) 0.9 % IJ SOLN
INTRAMUSCULAR | Status: AC
  Filled 2020-08-18: qty 50

## 2020-08-18 MED ORDER — HYDRALAZINE HCL 10 MG PO TABS
10.0000 mg | ORAL_TABLET | Freq: Once | ORAL | Status: AC
  Administered 2020-08-18: 10 mg via ORAL
  Filled 2020-08-18: qty 1

## 2020-08-18 MED ORDER — AMLODIPINE BESYLATE 5 MG PO TABS
10.0000 mg | ORAL_TABLET | Freq: Once | ORAL | Status: AC
  Administered 2020-08-18: 10 mg via ORAL
  Filled 2020-08-18: qty 2

## 2020-08-18 MED ORDER — FLUCONAZOLE 150 MG PO TABS: 150.0000 mg | ORAL_TABLET | Freq: Every day | ORAL | 0 refills | Status: DC

## 2020-08-18 MED ORDER — IOHEXOL 300 MG/ML  SOLN
100.0000 mL | Freq: Once | INTRAMUSCULAR | Status: AC | PRN
  Administered 2020-08-18: 100 mL via INTRAVENOUS

## 2020-08-18 NOTE — ED Notes (Signed)
Pt in CT at this time.

## 2020-08-18 NOTE — ED Notes (Signed)
[facility] called for transport back to [place]

## 2020-08-18 NOTE — ED Notes (Signed)
Attempted to call report to [facility].

## 2020-08-18 NOTE — ED Notes (Signed)
MD Schlossman paged in regards to pts elevated BP.

## 2020-08-18 NOTE — Discharge Instructions (Addendum)
Continue the diuretic as prescribed 2 days ago and follow up closely with Cardiology as an outpatient. Elevated your scrotum as much as possible.

## 2020-08-18 NOTE — ED Provider Notes (Signed)
[facility] DEPT Provider Note   CSN: [id number] Arrival date & time: 08/17/20  2329     History Chief Complaint  Patient presents with   Groin Swelling    [name] is a 60 y.o. male.  edcas      60yo male with history of coronary artery disease, hypertension, hyperlipidemia, congestive heart failure, diabetes type 2, history of CVA with right-sided deficits, history of polysubstance abuse, obesity, admission in May for chest pain and shortness of breath, evaluation 2 days ago for scrotal swelling who presents with scrotal swelling and pain.  Reports he started the diuretic but has not improved. He is now having pain through his lower abdomen and groin.  Pain is moderate. No known fevers.  No nausea, vomiting or diarrhea.    Past Medical History:  Diagnosis Date   Arthritis    Bipolar affective ([place])    CHF (congestive heart failure) ([place])    Claustrophobia    Severe   Depression    Diabetes mellitus    uncontrolled    Heart murmur    Hypertension     Patient Active Problem List   Diagnosis Date Noted   TIA (transient ischemic attack) 07/03/2020   Acute on chronic heart failure with preserved ejection fraction ([place])    Pulmonary artery hypertension ([place]) 06/25/2020   CKD (chronic kidney disease) 06/25/2020   History of cerebrovascular accident (CVA) with residual deficit 06/25/2020   Chronic kidney disease, stage 4 (severe) ([place]) 03/30/2020   Morbid (severe) obesity due to excess calories ([place]) 03/30/2020   Malnutrition of moderate degree 01/25/2020   Palliative care by specialist    Goals of care, counseling/discussion    Nephrotic syndrome 01/06/2020   Anasarca 01/06/2020   Hypokalemia due to loss of potassium 01/04/2020   Generalized body aches 01/01/2020   Non-traumatic rhabdomyolysis 01/01/2020   Grade I diastolic dysfunction 16/08/3708   Microcytic anemia 01/01/2020   Hypokalemia 12/31/2019   Elevated CK    Chronic  diastolic CHF (congestive heart failure) ([place]) 12/31/2017   Diabetes mellitus type 2, uncontrolled ([place])    Pure hypercholesterolemia    Hyperglycemia 09/28/2017   Nonobstructive atherosclerosis of coronary artery 09/28/2017   MDD (major depressive disorder), recurrent episode, severe ([place]) 07/30/2017   Osteoarthritis 04/12/2015   Severe recurrent major depression with psychotic features ([place]) 01/04/2015   Substance-related disorder ([place]) 10/30/2014   Patient's noncompliance with other medical treatment and regimen 06/07/2014   Cocaine dependence, uncomplicated ([place]) 62/69/4854   Chronic pain 05/31/2014   Cocaine abuse ([place]) 05/31/2014   History of noncompliance with medical treatment 05/31/2014   Malingering 05/31/2014   Leukopenia 05/31/2014   Diabetic neuropathy associated with diabetes mellitus due to underlying condition ([place]) 05/31/2014   Diabetic neuropathy ([place]) 05/31/2014   Congestive heart failure with left ventricular systolic dysfunction ([place]) 02/22/2014   Depression, major, recurrent, severe with psychosis ([place]) 08/29/2013   Suicidal ideations 08/28/2013   Uncontrolled diabetes mellitus ([place]) 06/30/2013   Atypical chest pain 12/09/2011   Essential hypertension 12/09/2011   Episodic mood disorder ([place]) 05/13/2011    Past Surgical History:  Procedure Laterality Date   BOWEL DECOMPRESSION N/A 01/19/2020   Procedure: BOWEL DECOMPRESSION;  Surgeon: Otis Brace, MD;  Location: [facility];  Service: Gastroenterology;  Laterality: N/A;   COLONOSCOPY WITH PROPOFOL N/A 01/19/2020   Procedure: COLONOSCOPY WITH PROPOFOL;  Surgeon: Otis Brace, MD;  Location: [facility];  Service: Gastroenterology;  Laterality: N/A;   KNEE SURGERY  b/l knees; pending total knee    RADIOLOGY WITH ANESTHESIA N/A 07/04/2020   Procedure: MRI WITH ANESTHESIA BRAIN WITHOUT CONTRAST AND HEAD WITHOUT CONTRAST;  Surgeon: Radiologist, Medication, MD;  Location: [facility];  Service: Radiology;   Laterality: N/A;       Family History  Problem Relation Age of Onset   Diabetes Other    Cancer Mother        uterus; deceased    Heart attack Mother 63   Hypertension Other    Heart attack Father 81   Stroke Sister        12/2012 age 94 y.o    Social History   Tobacco Use   Smoking status: Former    Pack years: 0.00   Smokeless tobacco: Never  Vaping Use   Vaping Use: Never used  Substance Use Topics   Alcohol use: No   Drug use: Not Currently    Types: Cocaine    Comment: Last use 2019    Home Medications Prior to Admission medications   Medication Sig Start Date End Date Taking? Authorizing Provider  fluconazole (DIFLUCAN) 150 MG tablet Take 1 tablet (150 mg total) by mouth daily for 10 days. 08/18/20 08/28/20 Yes Gareth Morgan, MD  amLODipine (NORVASC) 10 MG tablet Take 1 tablet (10 mg total) by mouth daily. 07/05/20 10/03/20  [place] Donnamarie Poag, DO  ammonium lactate (LAC-HYDRIN) 12 % lotion Apply 1 application topically 2 (two) times daily. Both legs 05/19/20   [provider]  amoxicillin (AMOXIL) 500 MG capsule Take 500 mg by mouth every 12 (twelve) hours. For 5 days    [provider]  aspirin 81 MG chewable tablet Chew 1 tablet (81 mg total) by mouth daily. 07/06/20 10/04/20  [place] Donnamarie Poag, DO  atorvastatin (LIPITOR) 80 MG tablet Take 1 tablet (80 mg total) by mouth at bedtime. 07/05/20 10/03/20  [place] Donnamarie Poag, DO  carvedilol (COREG) 12.5 MG tablet Take 2 tablets (25 mg total) by mouth 2 (two) times daily with a meal. 08/07/20 08/02/21  Cantwell, Celeste C, PA-C  insulin aspart (NOVOLOG) 100 UNIT/ML injection Inject 12 Units into the skin 3 (three) times daily before meals. For blood sugars 0-150 give 0 units of insulin, 151-200 give 2 units of insulin, 201-250 give 4 units, 251-300 give 6 units, 301-350 give 8 units, 351-400 give 10 units,> 400 give 12 units and call M.D. Patient taking differently: Inject 2-12 Units into the skin See admin instructions.  Check and record blood sugar before meals and inject per ssi. 151-200 = 2 units  201-250 = 4 units  251-300 = 6 units  301-350 = 8 units  351-400 = 10 units  greater than 400 = 12 units and call MD. 03/30/20   [name], NP  Insulin Glargine (BASAGLAR KWIKPEN) 100 UNIT/ML Inject 30 Units into the skin at bedtime. 07/05/20 10/03/20  [place] Eric J, DO  NON FORMULARY ensure drink    [provider]  ondansetron (ZOFRAN ODT) 4 MG disintegrating tablet 4mg  ODT q4 hours prn nausea/vomit Patient taking differently: Take 4 mg by mouth every 4 (four) hours as needed for nausea or vomiting. 07/11/20   Drenda Freeze, MD  polyethylene glycol (MIRALAX / GLYCOLAX) 17 g packet Take 17 g by mouth daily as needed. Patient taking differently: Take 17 g by mouth daily as needed for mild constipation. 03/30/20   [name], NP  pregabalin (LYRICA) 75 MG capsule Take 1 capsule (75 mg total) by mouth  2 (two) times daily. 07/05/20 10/03/20  [place] Eric J, DO  sacubitril-valsartan (ENTRESTO) 49-51 MG Take 1 tablet by mouth 2 (two) times daily. 07/05/20 10/03/20  [place] Donnamarie Poag, DO  sertraline (ZOLOFT) 50 MG tablet Take 50 mg by mouth daily.    [provider]  spironolactone (ALDACTONE) 25 MG tablet Take 1 tablet (25 mg total) by mouth daily. 08/07/20   Cantwell, Celeste C, PA-C  tamsulosin (FLOMAX) 0.4 MG CAPS capsule Take 2 capsules (0.8 mg total) by mouth daily. 07/06/20 10/04/20  [place] Donnamarie Poag, DO  torsemide (DEMADEX) 20 MG tablet Take 2 tablets (40 mg total) by mouth daily. 08/16/20 09/15/20  Tedd Sias, PA    Allergies    Other  Review of Systems   Review of Systems  Constitutional:  Negative for fever.  Respiratory:  Negative for shortness of breath.   Cardiovascular:  Negative for chest pain.  Gastrointestinal:  Positive for abdominal pain and constipation. Negative for diarrhea, nausea and vomiting.  Genitourinary:  Positive for penile swelling and scrotal swelling.  Negative for difficulty urinating.  Musculoskeletal:  Negative for back pain.  Skin:  Negative for rash.  Neurological:  Negative for headaches.   Physical Exam Updated Vital Signs BP (!) 197/114 (BP Location: Left Arm)   Pulse 82   Temp 98 F (36.7 C) (Oral)   Resp 16   Ht 5\' 8"  (1.727 m)   Wt 115.7 kg   SpO2 94%   BMI 38.77 kg/m   Physical Exam Vitals and nursing note reviewed.  Constitutional:      General: He is not in acute distress.    Appearance: He is well-developed. He is not diaphoretic.  HENT:     Head: Normocephalic and atraumatic.  Eyes:     Conjunctiva/sclera: Conjunctivae normal.  Cardiovascular:     Rate and Rhythm: Normal rate and regular rhythm.     Heart sounds: Normal heart sounds. No murmur heard.   No friction rub. No gallop.  Pulmonary:     Effort: Pulmonary effort is normal. No respiratory distress.     Breath sounds: Normal breath sounds. No wheezing or rales.  Abdominal:     General: There is no distension.     Palpations: Abdomen is soft.     Tenderness: There is abdominal tenderness (bilateral lower abdomen, subcutaneous edema). There is no guarding.  Genitourinary:    Comments: Scrotal swelling bilaterally, tenderness bilateral scrotum, groin, no appreciable hernia,ulceration Musculoskeletal:        General: Swelling present.     Cervical back: Normal range of motion.  Skin:    General: Skin is warm and dry.  Neurological:     Mental Status: He is alert and oriented to person, place, and time.    ED Results / Procedures / Treatments   Labs (all labs ordered are listed, but only abnormal results are displayed) Labs Reviewed  BASIC METABOLIC PANEL - Abnormal; Notable for the following components:      Result Value   Glucose, Bld 147 (*)    BUN 35 (*)    Creatinine, Ser 1.51 (*)    Calcium 8.6 (*)    GFR, Estimated 53 (*)    All other components within normal limits  CBC WITH DIFFERENTIAL/PLATELET - Abnormal; Notable for the  following components:   RBC 3.94 (*)    Hemoglobin 9.8 (*)    HCT 31.5 (*)    MCV 79.9 (*)    MCH 24.9 (*)  All other components within normal limits  BRAIN NATRIURETIC PEPTIDE - Abnormal; Notable for the following components:   B Natriuretic Peptide 139.2 (*)    All other components within normal limits    EKG None  Radiology DG Chest 1 View  Result Date: 08/18/2020 CLINICAL DATA:  Dyspnea EXAM: CHEST  1 VIEW COMPARISON:  08/16/2020 FINDINGS: Lungs volumes are small, but are symmetric and are clear. No pneumothorax or pleural effusion. Stable mild cardiomegaly. Pulmonary vascularity is normal. Osseous structures are age-appropriate. No acute bone abnormality. IMPRESSION: Pulmonary hypoinflation.  Stable cardiomegaly. Electronically Signed   By: Fidela Salisbury MD   On: 08/18/2020 00:05   CT ABDOMEN PELVIS W CONTRAST  Result Date: 08/18/2020 CLINICAL DATA:  Abdomen and groin pain and groin swelling. EXAM: CT ABDOMEN AND PELVIS WITH CONTRAST TECHNIQUE: Multidetector CT imaging of the abdomen and pelvis was performed using the standard protocol following bolus administration of intravenous contrast. CONTRAST:  167mL OMNIPAQUE IOHEXOL 300 MG/ML  SOLN COMPARISON:  07/11/2020 FINDINGS: Lower chest: Interval increase in size of a pericardial effusion, currently measuring 2.2 cm in maximum thickness. The heart is mildly enlarged without significant change. Prominent pulmonary vasculature is again demonstrated at the lung bases. No pleural fluid. Hepatobiliary: 2.7 cm gallstone in the gallbladder without gallbladder wall thickening or pericholecystic fluid. Unremarkable liver. Pancreas: Unremarkable. No pancreatic ductal dilatation or surrounding inflammatory changes. Spleen: Normal in size without focal abnormality. Adrenals/Urinary Tract: Adrenal glands are unremarkable. Kidneys are normal, without renal calculi, focal lesion, or hydronephrosis. Bladder is unremarkable. Stomach/Bowel: Dilated rectum  and sigmoid colon with an interval increase in caliber as well as prominent stool in the rectum and distal sigmoid colon. The remainder of the colon remains borderline dilated. Unremarkable stomach, small bowel and appendix. Vascular/Lymphatic: No significant vascular findings are present. No enlarged abdominal or pelvic lymph nodes. Reproductive: Prostate is unremarkable. Partially included bilateral hydroceles. Other: Extensive diffuse subcutaneous edema with mild progression. No free peritoneal fluid or hernia seen. Musculoskeletal: Lumbar and lower thoracic spine degenerative changes. IMPRESSION: 1. Extensive diffuse subcutaneous edema, mildly increased. 2. Partially included bilateral scrotal hydroceles. 3. Moderate-sized pericardial effusion, increased. 4. Increased stool in the rectum and distal sigmoid colon, causing partial functional obstruction of the colon. 5. Cholelithiasis without evidence of cholecystitis. Electronically Signed   By: Claudie Revering M.D.   On: 08/18/2020 10:01    Procedures Procedures   Medications Ordered in ED Medications  iohexol (OMNIPAQUE) 300 MG/ML solution 100 mL (100 mLs Intravenous Contrast Given 08/18/20 0900)  sodium chloride (PF) 0.9 % injection (  Given by Other 08/18/20 0937)  amLODipine (NORVASC) tablet 10 mg (10 mg Oral Given 08/18/20 1631)  hydrALAZINE (APRESOLINE) tablet 10 mg (10 mg Oral Given 08/18/20 1631)    ED Course  I have reviewed the triage vital signs and the nursing notes.  Pertinent labs & imaging results that were available during my care of the patient were reviewed by me and considered in my medical decision making (see chart for details).    MDM Rules/Calculators/A&P                          60yo male with history of coronary artery disease, hypertension, hyperlipidemia, congestive heart failure, diabetes type 2, history of CVA with right-sided deficits, history of polysubstance abuse, obesity, admission in May for chest pain and  shortness of breath, evaluation 2 days ago for scrotal swelling who presents with scrotal swelling and pain.  Given pain  developing, ordered CT abdomen pelvis to evaluate for signs of hernia or subcutaneous air/fournier's/abscess.  CT with edema, hydrocele without other abnormalities.  Suspect pain related to fungal infection and swelling. Recommend continuing the diuretic with cardiology follow up and given fluconazole for fungal infection. Patient discharged in stable condition with understanding of reasons to return.   Final Clinical Impression(s) / ED Diagnoses Final diagnoses:  Scrotal swelling  Tinea cruris  Dependent edema    Rx / DC Orders ED Discharge Orders          Ordered    fluconazole (DIFLUCAN) 150 MG tablet  Daily        08/18/20 1139             Gareth Morgan, MD 08/19/20 [phone]

## 2020-08-21 ENCOUNTER — Encounter (HOSPITAL_COMMUNITY): Payer: Self-pay

## 2020-08-21 ENCOUNTER — Emergency Department (HOSPITAL_COMMUNITY): Payer: Medicare HMO

## 2020-08-21 ENCOUNTER — Inpatient Hospital Stay (HOSPITAL_COMMUNITY)
Admission: EM | Admit: 2020-08-21 | Discharge: 2020-09-22 | DRG: 291 | Disposition: A | Discharge status: Skilled Nursing Facility | Payer: Medicare HMO | Source: Skilled Nursing Facility | Attending: Internal Medicine | Admitting: Internal Medicine

## 2020-08-21 DIAGNOSIS — R32 Unspecified urinary incontinence: Secondary | ICD-10-CM | POA: Diagnosis not present

## 2020-08-21 DIAGNOSIS — Z9111 Patient's noncompliance with dietary regimen: Secondary | ICD-10-CM

## 2020-08-21 DIAGNOSIS — Z833 Family history of diabetes mellitus: Secondary | ICD-10-CM

## 2020-08-21 DIAGNOSIS — N179 Acute kidney failure, unspecified: Secondary | ICD-10-CM

## 2020-08-21 DIAGNOSIS — I10 Essential (primary) hypertension: Secondary | ICD-10-CM | POA: Diagnosis present

## 2020-08-21 DIAGNOSIS — K219 Gastro-esophageal reflux disease without esophagitis: Secondary | ICD-10-CM | POA: Diagnosis present

## 2020-08-21 DIAGNOSIS — N401 Enlarged prostate with lower urinary tract symptoms: Secondary | ICD-10-CM | POA: Diagnosis present

## 2020-08-21 DIAGNOSIS — I2510 Atherosclerotic heart disease of native coronary artery without angina pectoris: Secondary | ICD-10-CM | POA: Diagnosis present

## 2020-08-21 DIAGNOSIS — E785 Hyperlipidemia, unspecified: Secondary | ICD-10-CM | POA: Diagnosis present

## 2020-08-21 DIAGNOSIS — E876 Hypokalemia: Secondary | ICD-10-CM | POA: Diagnosis present

## 2020-08-21 DIAGNOSIS — I3139 Other pericardial effusion (noninflammatory): Secondary | ICD-10-CM

## 2020-08-21 DIAGNOSIS — D509 Iron deficiency anemia, unspecified: Secondary | ICD-10-CM | POA: Diagnosis present

## 2020-08-21 DIAGNOSIS — Z993 Dependence on wheelchair: Secondary | ICD-10-CM

## 2020-08-21 DIAGNOSIS — R079 Chest pain, unspecified: Secondary | ICD-10-CM | POA: Diagnosis not present

## 2020-08-21 DIAGNOSIS — K567 Ileus, unspecified: Secondary | ICD-10-CM

## 2020-08-21 DIAGNOSIS — Z823 Family history of stroke: Secondary | ICD-10-CM

## 2020-08-21 DIAGNOSIS — N1831 Chronic kidney disease, stage 3a: Secondary | ICD-10-CM | POA: Diagnosis present

## 2020-08-21 DIAGNOSIS — Z20822 Contact with and (suspected) exposure to covid-19: Secondary | ICD-10-CM | POA: Diagnosis present

## 2020-08-21 DIAGNOSIS — D72818 Other decreased white blood cell count: Secondary | ICD-10-CM | POA: Diagnosis present

## 2020-08-21 DIAGNOSIS — IMO0002 Reserved for concepts with insufficient information to code with codable children: Secondary | ICD-10-CM | POA: Diagnosis present

## 2020-08-21 DIAGNOSIS — R600 Localized edema: Secondary | ICD-10-CM | POA: Diagnosis not present

## 2020-08-21 DIAGNOSIS — F319 Bipolar disorder, unspecified: Secondary | ICD-10-CM | POA: Diagnosis present

## 2020-08-21 DIAGNOSIS — D631 Anemia in chronic kidney disease: Secondary | ICD-10-CM | POA: Diagnosis present

## 2020-08-21 DIAGNOSIS — I509 Heart failure, unspecified: Secondary | ICD-10-CM | POA: Diagnosis not present

## 2020-08-21 DIAGNOSIS — I130 Hypertensive heart and chronic kidney disease with heart failure and stage 1 through stage 4 chronic kidney disease, or unspecified chronic kidney disease: Secondary | ICD-10-CM | POA: Diagnosis not present

## 2020-08-21 DIAGNOSIS — I160 Hypertensive urgency: Secondary | ICD-10-CM | POA: Diagnosis present

## 2020-08-21 DIAGNOSIS — R609 Edema, unspecified: Secondary | ICD-10-CM | POA: Diagnosis not present

## 2020-08-21 DIAGNOSIS — E1165 Type 2 diabetes mellitus with hyperglycemia: Secondary | ICD-10-CM | POA: Diagnosis present

## 2020-08-21 DIAGNOSIS — R0789 Other chest pain: Secondary | ICD-10-CM | POA: Diagnosis not present

## 2020-08-21 DIAGNOSIS — I313 Pericardial effusion (noninflammatory): Secondary | ICD-10-CM | POA: Diagnosis present

## 2020-08-21 DIAGNOSIS — E8809 Other disorders of plasma-protein metabolism, not elsewhere classified: Secondary | ICD-10-CM | POA: Diagnosis present

## 2020-08-21 DIAGNOSIS — E1140 Type 2 diabetes mellitus with diabetic neuropathy, unspecified: Secondary | ICD-10-CM | POA: Diagnosis present

## 2020-08-21 DIAGNOSIS — D649 Anemia, unspecified: Secondary | ICD-10-CM | POA: Diagnosis present

## 2020-08-21 DIAGNOSIS — I110 Hypertensive heart disease with heart failure: Secondary | ICD-10-CM | POA: Diagnosis not present

## 2020-08-21 DIAGNOSIS — E1122 Type 2 diabetes mellitus with diabetic chronic kidney disease: Secondary | ICD-10-CM | POA: Diagnosis present

## 2020-08-21 DIAGNOSIS — I639 Cerebral infarction, unspecified: Secondary | ICD-10-CM

## 2020-08-21 DIAGNOSIS — I69351 Hemiplegia and hemiparesis following cerebral infarction affecting right dominant side: Secondary | ICD-10-CM

## 2020-08-21 DIAGNOSIS — I5033 Acute on chronic diastolic (congestive) heart failure: Secondary | ICD-10-CM | POA: Diagnosis present

## 2020-08-21 DIAGNOSIS — Z9119 Patient's noncompliance with other medical treatment and regimen: Secondary | ICD-10-CM

## 2020-08-21 DIAGNOSIS — Z79899 Other long term (current) drug therapy: Secondary | ICD-10-CM

## 2020-08-21 DIAGNOSIS — R109 Unspecified abdominal pain: Secondary | ICD-10-CM

## 2020-08-21 DIAGNOSIS — Z8249 Family history of ischemic heart disease and other diseases of the circulatory system: Secondary | ICD-10-CM

## 2020-08-21 DIAGNOSIS — R338 Other retention of urine: Secondary | ICD-10-CM | POA: Diagnosis present

## 2020-08-21 DIAGNOSIS — F40240 Claustrophobia: Secondary | ICD-10-CM | POA: Diagnosis present

## 2020-08-21 DIAGNOSIS — E1142 Type 2 diabetes mellitus with diabetic polyneuropathy: Secondary | ICD-10-CM | POA: Diagnosis present

## 2020-08-21 DIAGNOSIS — Z794 Long term (current) use of insulin: Secondary | ICD-10-CM

## 2020-08-21 DIAGNOSIS — Z743 Need for continuous supervision: Secondary | ICD-10-CM | POA: Diagnosis not present

## 2020-08-21 DIAGNOSIS — Z7982 Long term (current) use of aspirin: Secondary | ICD-10-CM

## 2020-08-21 DIAGNOSIS — Z8049 Family history of malignant neoplasm of other genital organs: Secondary | ICD-10-CM

## 2020-08-21 DIAGNOSIS — N183 Chronic kidney disease, stage 3 unspecified: Secondary | ICD-10-CM | POA: Diagnosis present

## 2020-08-21 DIAGNOSIS — Z87891 Personal history of nicotine dependence: Secondary | ICD-10-CM

## 2020-08-21 DIAGNOSIS — Z6841 Body Mass Index (BMI) 40.0 and over, adult: Secondary | ICD-10-CM

## 2020-08-21 LAB — COMPREHENSIVE METABOLIC PANEL
ALT: 14 U/L (ref 0–44)
AST: 18 U/L (ref 15–41)
Albumin: 2.4 g/dL — ABNORMAL LOW (ref 3.5–5.0)
Alkaline Phosphatase: 88 U/L (ref 38–126)
Anion gap: 7 (ref 5–15)
BUN: 34 mg/dL — ABNORMAL HIGH (ref 6–20)
CO2: 23 mmol/L (ref 22–32)
Calcium: 8.3 mg/dL — ABNORMAL LOW (ref 8.9–10.3)
Chloride: 106 mmol/L (ref 98–111)
Creatinine, Ser: 2.05 mg/dL — ABNORMAL HIGH (ref 0.61–1.24)
GFR, Estimated: 37 mL/min — ABNORMAL LOW (ref >60)
Glucose, Bld: 189 mg/dL — ABNORMAL HIGH (ref 70–99)
Potassium: 3.1 mmol/L — ABNORMAL LOW (ref 3.5–5.1)
Sodium: 136 mmol/L (ref 135–145)
Total Bilirubin: 0.6 mg/dL (ref 0.3–1.2)
Total Protein: 6.6 g/dL (ref 6.5–8.1)

## 2020-08-21 LAB — CBC WITH DIFFERENTIAL/PLATELET
Abs Immature Granulocytes: 0.01 10*3/uL (ref 0.00–0.07)
Basophils Absolute: 0 10*3/uL (ref 0.0–0.1)
Basophils Relative: 1 %
Eosinophils Absolute: 0.2 10*3/uL (ref 0.0–0.5)
Eosinophils Relative: 7 %
HCT: 33.5 % — ABNORMAL LOW (ref 39.0–52.0)
Hemoglobin: 10.4 g/dL — ABNORMAL LOW (ref 13.0–17.0)
Immature Granulocytes: 0 %
Lymphocytes Relative: 28 %
Lymphs Abs: 1 10*3/uL (ref 0.7–4.0)
MCH: 24.8 pg — ABNORMAL LOW (ref 26.0–34.0)
MCHC: 31 g/dL (ref 30.0–36.0)
MCV: 79.8 fL — ABNORMAL LOW (ref 80.0–100.0)
Monocytes Absolute: 0.5 10*3/uL (ref 0.1–1.0)
Monocytes Relative: 13 %
Neutro Abs: 1.8 10*3/uL (ref 1.7–7.7)
Neutrophils Relative %: 51 %
Platelets: 252 10*3/uL (ref 150–400)
RBC: 4.2 MIL/uL — ABNORMAL LOW (ref 4.22–5.81)
RDW: 15 % (ref 11.5–15.5)
WBC: 3.4 10*3/uL — ABNORMAL LOW (ref 4.0–10.5)
nRBC: 0 % (ref 0.0–0.2)

## 2020-08-21 LAB — TROPONIN I (HIGH SENSITIVITY)
Troponin I (High Sensitivity): 16 ng/L (ref <18)
Troponin I (High Sensitivity): 20 ng/L — ABNORMAL HIGH (ref <18)

## 2020-08-21 LAB — BRAIN NATRIURETIC PEPTIDE: B Natriuretic Peptide: 226.2 pg/mL — ABNORMAL HIGH (ref 0.0–100.0)

## 2020-08-21 MED ORDER — FUROSEMIDE 10 MG/ML IJ SOLN
40.0000 mg | Freq: Once | INTRAMUSCULAR | Status: AC
  Administered 2020-08-21: 40 mg via INTRAVENOUS
  Filled 2020-08-21: qty 4

## 2020-08-21 NOTE — ED Notes (Signed)
Ronalee Belts RN to attempt [place] IV.

## 2020-08-21 NOTE — ED Notes (Signed)
Unsuccessful IV attempt x 1.  Asked Charge RN to attempt [place] IV.

## 2020-08-21 NOTE — ED Provider Notes (Signed)
[facility] DEPT Provider Note   CSN: [id number] Arrival date & time: 08/21/20  1620     History Chief Complaint  Patient presents with   Chest Pain   Leg Swelling    [name] is a 60 y.o. male.   Chest Pain Associated symptoms: shortness of breath and weakness   Associated symptoms: no abdominal pain and no back pain   Patient presents with chest pain shortness of breath.  Began in the anterior chest today.  States he feels more short of breath.  History of CHF.  History of some chronic chest pain 2.  Has had previous stroke with right-sided weakness.  Has developed pain in the anterior chest.  Also increased swelling in his legs.  States he has been compliant with his medications  because they given to him at the nursing home.    Past Medical History:  Diagnosis Date   Arthritis    Bipolar affective ([place])    CHF (congestive heart failure) ([place])    Claustrophobia    Severe   Depression    Diabetes mellitus    uncontrolled    Heart murmur    Hypertension     Patient Active Problem List   Diagnosis Date Noted   TIA (transient ischemic attack) 07/03/2020   Acute on chronic heart failure with preserved ejection fraction ([place])    Pulmonary artery hypertension ([place]) 06/25/2020   CKD (chronic kidney disease) 06/25/2020   History of cerebrovascular accident (CVA) with residual deficit 06/25/2020   Chronic kidney disease, stage 4 (severe) ([place]) 03/30/2020   Morbid (severe) obesity due to excess calories ([place]) 03/30/2020   Malnutrition of moderate degree 01/25/2020   Palliative care by specialist    Goals of care, counseling/discussion    Nephrotic syndrome 01/06/2020   Anasarca 01/06/2020   Hypokalemia due to loss of potassium 01/04/2020   Generalized body aches 01/01/2020   Non-traumatic rhabdomyolysis 01/01/2020   Grade I diastolic dysfunction 35/32/9924   Microcytic anemia 01/01/2020   Hypokalemia 12/31/2019   Elevated CK     Chronic diastolic CHF (congestive heart failure) ([place]) 12/31/2017   Diabetes mellitus type 2, uncontrolled ([place])    Pure hypercholesterolemia    Hyperglycemia 09/28/2017   Nonobstructive atherosclerosis of coronary artery 09/28/2017   MDD (major depressive disorder), recurrent episode, severe ([place]) 07/30/2017   Osteoarthritis 04/12/2015   Severe recurrent major depression with psychotic features ([place]) 01/04/2015   Substance-related disorder ([place]) 10/30/2014   Patient's noncompliance with other medical treatment and regimen 06/07/2014   Cocaine dependence, uncomplicated ([place]) 26/83/4196   Chronic pain 05/31/2014   Cocaine abuse ([place]) 05/31/2014   History of noncompliance with medical treatment 05/31/2014   Malingering 05/31/2014   Leukopenia 05/31/2014   Diabetic neuropathy associated with diabetes mellitus due to underlying condition ([employer]) 05/31/2014   Diabetic neuropathy ([place]) 05/31/2014   Congestive heart failure with left ventricular systolic dysfunction ([place]) 02/22/2014   Depression, major, recurrent, severe with psychosis ([place]) 08/29/2013   Suicidal ideations 08/28/2013   Uncontrolled diabetes mellitus ([place]) 06/30/2013   Atypical chest pain 12/09/2011   Essential hypertension 12/09/2011   Episodic mood disorder ([place]) 05/13/2011    Past Surgical History:  Procedure Laterality Date   BOWEL DECOMPRESSION N/A 01/19/2020   Procedure: BOWEL DECOMPRESSION;  Surgeon: Otis Brace, MD;  Location: [facility];  Service: Gastroenterology;  Laterality: N/A;   COLONOSCOPY WITH PROPOFOL N/A 01/19/2020   Procedure: COLONOSCOPY WITH PROPOFOL;  Surgeon: Otis Brace, MD;  Location: [facility];  Service: Gastroenterology;  Laterality: N/A;   KNEE SURGERY     b/l knees; pending total knee    RADIOLOGY WITH ANESTHESIA N/A 07/04/2020   Procedure: MRI WITH ANESTHESIA BRAIN WITHOUT CONTRAST AND HEAD WITHOUT CONTRAST;  Surgeon: Radiologist, Medication, MD;  Location: [facility];  Service:  Radiology;  Laterality: N/A;       Family History  Problem Relation Age of Onset   Diabetes Other    Cancer Mother        uterus; deceased    Heart attack Mother 31   Hypertension Other    Heart attack Father 60   Stroke Sister        12/2012 age 85 y.o    Social History   Tobacco Use   Smoking status: Former    Pack years: 0.00   Smokeless tobacco: Never  Vaping Use   Vaping Use: Never used  Substance Use Topics   Alcohol use: No   Drug use: Not Currently    Types: Cocaine    Comment: Last use 2019    Home Medications Prior to Admission medications   Medication Sig Start Date End Date Taking? Authorizing Provider  amLODipine (NORVASC) 10 MG tablet Take 1 tablet (10 mg total) by mouth daily. 07/05/20 10/03/20 Yes [place] Eric J, DO  ammonium lactate (LAC-HYDRIN) 12 % lotion Apply 1 application topically 2 (two) times daily. Both legs 05/19/20  Yes [provider]  aspirin 81 MG chewable tablet Chew 1 tablet (81 mg total) by mouth daily. 07/06/20 10/04/20 Yes [place] Eric J, DO  atorvastatin (LIPITOR) 80 MG tablet Take 1 tablet (80 mg total) by mouth at bedtime. 07/05/20 10/03/20 Yes [place] Eric J, DO  carvedilol (COREG) 12.5 MG tablet Take 2 tablets (25 mg total) by mouth 2 (two) times daily with a meal. Patient taking differently: Take 12.5 mg by mouth 2 (two) times daily with a meal. 08/07/20 08/02/21 Yes Cantwell, Celeste C, PA-C  insulin aspart (NOVOLOG) 100 UNIT/ML injection Inject 12 Units into the skin 3 (three) times daily before meals. For blood sugars 0-150 give 0 units of insulin, 151-200 give 2 units of insulin, 201-250 give 4 units, 251-300 give 6 units, 301-350 give 8 units, 351-400 give 10 units,> 400 give 12 units and call M.D. Patient taking differently: Inject 2-12 Units into the skin See admin instructions. Check and record blood sugar before meals and inject per ssi. 151-200 = 2 units  201-250 = 4 units  251-300 = 6 units  301-350 = 8 units  351-400  = 10 units  greater than 400 = 12 units and call MD. 03/30/20  Yes [name], NP  Insulin Glargine (BASAGLAR KWIKPEN) 100 UNIT/ML Inject 30 Units into the skin at bedtime. 07/05/20 10/03/20 Yes [place] Eric J, DO  NON FORMULARY Take 237 mLs by mouth in the morning and at bedtime. ensure drink   Yes [provider]  pantoprazole (PROTONIX) 40 MG tablet Take 40 mg by mouth daily.   Yes [provider]  pregabalin (LYRICA) 75 MG capsule Take 1 capsule (75 mg total) by mouth 2 (two) times daily. 07/05/20 10/03/20 Yes [place] Eric J, DO  sacubitril-valsartan (ENTRESTO) 49-51 MG Take 1 tablet by mouth 2 (two) times daily. 07/05/20 10/03/20 Yes [place] Eric J, DO  sertraline (ZOLOFT) 50 MG tablet Take 50 mg by mouth daily.   Yes [provider]  tamsulosin (FLOMAX) 0.4 MG CAPS capsule Take 2 capsules (0.8 mg total) by mouth daily. 07/06/20 10/04/20  Yes [place] Donnamarie Poag, DO  torsemide (DEMADEX) 20 MG tablet Take 2 tablets (40 mg total) by mouth daily. 08/16/20 09/15/20 Yes [place], Wylder S, PA  fluconazole (DIFLUCAN) 150 MG tablet Take 1 tablet (150 mg total) by mouth daily for 10 days. 08/18/20 08/28/20  Gareth Morgan, MD  ondansetron (ZOFRAN ODT) 4 MG disintegrating tablet 4mg  ODT q4 hours prn nausea/vomit Patient taking differently: Take 4 mg by mouth every 4 (four) hours as needed for nausea or vomiting. 07/11/20   Drenda Freeze, MD  polyethylene glycol (MIRALAX / GLYCOLAX) 17 g packet Take 17 g by mouth daily as needed. Patient taking differently: Take 17 g by mouth daily as needed for mild constipation. 03/30/20   [name], NP  spironolactone (ALDACTONE) 25 MG tablet Take 1 tablet (25 mg total) by mouth daily. 08/07/20   Cantwell, Celeste C, PA-C    Allergies    Other  Review of Systems   Review of Systems  Constitutional:  Negative for appetite change.  HENT:  Negative for congestion.   Respiratory:  Positive for shortness of breath.   Cardiovascular:   Positive for chest pain and leg swelling.  Gastrointestinal:  Negative for abdominal pain.  Genitourinary:  Negative for flank pain.  Musculoskeletal:  Negative for back pain.  Neurological:  Positive for weakness.  Psychiatric/Behavioral:  Negative for confusion.    Physical Exam Updated Vital Signs BP (!) 176/108   Pulse 77   Temp (!) 97.5 F (36.4 C)   Resp 14   Ht 5\' 8"  (1.727 m)   Wt (!) 149.7 kg   SpO2 100%   BMI 50.18 kg/m   Physical Exam Vitals and nursing note reviewed.  HENT:     Head: Normocephalic.  Cardiovascular:     Rate and Rhythm: Normal rate and regular rhythm.     Heart sounds: Murmur heard.  Pulmonary:     Breath sounds: No rhonchi or rales.  Musculoskeletal:     Right lower leg: Edema present.     Left lower leg: Edema present.     Comments: Edema and chronic venous changes.  Skin:    Coloration: Skin is not pale.  Neurological:     Mental Status: He is alert.     Comments: Chronic right-sided weakness from stroke.  Large amount of scrotal and penile edema.  ED Results / Procedures / Treatments   Labs (all labs ordered are listed, but only abnormal results are displayed) Labs Reviewed  COMPREHENSIVE METABOLIC PANEL - Abnormal; Notable for the following components:      Result Value   Potassium 3.1 (*)    Glucose, Bld 189 (*)    BUN 34 (*)    Creatinine, Ser 2.05 (*)    Calcium 8.3 (*)    Albumin 2.4 (*)    GFR, Estimated 37 (*)    All other components within normal limits  BRAIN NATRIURETIC PEPTIDE - Abnormal; Notable for the following components:   B Natriuretic Peptide 226.2 (*)    All other components within normal limits  CBC WITH DIFFERENTIAL/PLATELET - Abnormal; Notable for the following components:   WBC 3.4 (*)    RBC 4.20 (*)    Hemoglobin 10.4 (*)    HCT 33.5 (*)    MCV 79.8 (*)    MCH 24.8 (*)    All other components within normal limits  TROPONIN I (HIGH SENSITIVITY) - Abnormal; Notable for the following components:    Troponin I (High Sensitivity) 20 (*)  All other components within normal limits  RESP PANEL BY RT-PCR (FLU A&B, COVID) ARPGX2  CBC WITH DIFFERENTIAL/PLATELET  TROPONIN I (HIGH SENSITIVITY)    EKG EKG Interpretation  Date/Time:  Monday August 21 2020 16:34:37 EDT Ventricular Rate:  79 PR Interval:  173 QRS Duration: 90 QT Interval:  394 QTC Calculation: 452 R Axis:   -3 Text Interpretation: Sinus rhythm Anteroseptal infarct, old Nonspecific T abnormalities, lateral leads No significant change since last tracing Confirmed by Davonna Belling [id number]) on 08/21/2020 5:38:51 PM  Radiology DG Chest Portable 1 View  Result Date: 08/21/2020 CLINICAL DATA:  Central chest pressure. EXAM: PORTABLE CHEST 1 VIEW COMPARISON:  Chest x-ray dated August 17, 2020. FINDINGS: The heart size and mediastinal contours are within normal limits. Both lungs are clear. The visualized skeletal structures are unremarkable. IMPRESSION: No active disease. Electronically Signed   By: Titus Dubin M.D.   On: 08/21/2020 18:10    Procedures Procedures   Medications Ordered in ED Medications  furosemide (LASIX) injection 40 mg (40 mg Intravenous Given 08/21/20 2333)    ED Course  I have reviewed the triage vital signs and the nursing notes.  Pertinent labs & imaging results that were available during my care of the patient were reviewed by me and considered in my medical decision making (see chart for details).    MDM Rules/Calculators/A&P                          Patient presents with chest pain.  Also has increasing swelling in his legs.  Hypertensive.  Has an increase in his creatinine.  Has been on Lasix at home.  Has had worsening edema in his scrotum.  CT scan done a couple days ago of the scrotum and reassuring. Final Clinical Impression(s) / ED Diagnoses Final diagnoses:  Acute on chronic congestive heart failure, unspecified heart failure type [facility])  Peripheral edema    Rx / DC Orders ED  Discharge Orders     None        Davonna Belling, MD 08/22/20 0013

## 2020-08-21 NOTE — ED Notes (Signed)
Charge RN unable to obtain IV access x 2 attempts.  IV team consulted.

## 2020-08-21 NOTE — ED Triage Notes (Signed)
Per EMS, Pt, from [facility], c/o generalized swelling and central chest pain.  Pain score 10/10.  Chest pain worsens w/ palpation.    Hx of CHF.

## 2020-08-22 ENCOUNTER — Inpatient Hospital Stay (HOSPITAL_COMMUNITY)
Admit: 2020-08-22 | Discharge: 2020-08-22 | Disposition: A | Discharge status: Home / Self Care | Payer: Medicare HMO | Attending: Family Medicine | Admitting: Family Medicine

## 2020-08-22 ENCOUNTER — Other Ambulatory Visit: Payer: Self-pay

## 2020-08-22 ENCOUNTER — Inpatient Hospital Stay (HOSPITAL_COMMUNITY): Payer: Medicare HMO

## 2020-08-22 DIAGNOSIS — Z794 Long term (current) use of insulin: Secondary | ICD-10-CM | POA: Diagnosis not present

## 2020-08-22 DIAGNOSIS — N179 Acute kidney failure, unspecified: Secondary | ICD-10-CM

## 2020-08-22 DIAGNOSIS — I130 Hypertensive heart and chronic kidney disease with heart failure and stage 1 through stage 4 chronic kidney disease, or unspecified chronic kidney disease: Secondary | ICD-10-CM | POA: Diagnosis not present

## 2020-08-22 DIAGNOSIS — I5033 Acute on chronic diastolic (congestive) heart failure: Secondary | ICD-10-CM

## 2020-08-22 DIAGNOSIS — F40240 Claustrophobia: Secondary | ICD-10-CM | POA: Diagnosis present

## 2020-08-22 DIAGNOSIS — I3139 Other pericardial effusion (noninflammatory): Secondary | ICD-10-CM

## 2020-08-22 DIAGNOSIS — N184 Chronic kidney disease, stage 4 (severe): Secondary | ICD-10-CM | POA: Diagnosis not present

## 2020-08-22 DIAGNOSIS — R109 Unspecified abdominal pain: Secondary | ICD-10-CM | POA: Diagnosis not present

## 2020-08-22 DIAGNOSIS — E1165 Type 2 diabetes mellitus with hyperglycemia: Secondary | ICD-10-CM | POA: Diagnosis not present

## 2020-08-22 DIAGNOSIS — I160 Hypertensive urgency: Secondary | ICD-10-CM | POA: Diagnosis present

## 2020-08-22 DIAGNOSIS — I639 Cerebral infarction, unspecified: Secondary | ICD-10-CM

## 2020-08-22 DIAGNOSIS — Z79899 Other long term (current) drug therapy: Secondary | ICD-10-CM | POA: Diagnosis not present

## 2020-08-22 DIAGNOSIS — I313 Pericardial effusion (noninflammatory): Secondary | ICD-10-CM

## 2020-08-22 DIAGNOSIS — Z7982 Long term (current) use of aspirin: Secondary | ICD-10-CM | POA: Diagnosis not present

## 2020-08-22 DIAGNOSIS — R1900 Intra-abdominal and pelvic swelling, mass and lump, unspecified site: Secondary | ICD-10-CM | POA: Diagnosis not present

## 2020-08-22 DIAGNOSIS — D509 Iron deficiency anemia, unspecified: Secondary | ICD-10-CM | POA: Diagnosis not present

## 2020-08-22 DIAGNOSIS — M7989 Other specified soft tissue disorders: Secondary | ICD-10-CM | POA: Diagnosis not present

## 2020-08-22 DIAGNOSIS — I69351 Hemiplegia and hemiparesis following cerebral infarction affecting right dominant side: Secondary | ICD-10-CM | POA: Diagnosis not present

## 2020-08-22 DIAGNOSIS — K5901 Slow transit constipation: Secondary | ICD-10-CM | POA: Diagnosis not present

## 2020-08-22 DIAGNOSIS — I10 Essential (primary) hypertension: Secondary | ICD-10-CM

## 2020-08-22 DIAGNOSIS — E119 Type 2 diabetes mellitus without complications: Secondary | ICD-10-CM | POA: Diagnosis not present

## 2020-08-22 DIAGNOSIS — N1831 Chronic kidney disease, stage 3a: Secondary | ICD-10-CM

## 2020-08-22 DIAGNOSIS — E1149 Type 2 diabetes mellitus with other diabetic neurological complication: Secondary | ICD-10-CM | POA: Diagnosis not present

## 2020-08-22 DIAGNOSIS — I700 Atherosclerosis of aorta: Secondary | ICD-10-CM | POA: Diagnosis not present

## 2020-08-22 DIAGNOSIS — K567 Ileus, unspecified: Secondary | ICD-10-CM | POA: Diagnosis not present

## 2020-08-22 DIAGNOSIS — F319 Bipolar disorder, unspecified: Secondary | ICD-10-CM | POA: Diagnosis present

## 2020-08-22 DIAGNOSIS — D72818 Other decreased white blood cell count: Secondary | ICD-10-CM | POA: Diagnosis present

## 2020-08-22 DIAGNOSIS — Z20822 Contact with and (suspected) exposure to covid-19: Secondary | ICD-10-CM | POA: Diagnosis not present

## 2020-08-22 DIAGNOSIS — I25119 Atherosclerotic heart disease of native coronary artery with unspecified angina pectoris: Secondary | ICD-10-CM | POA: Diagnosis not present

## 2020-08-22 DIAGNOSIS — E1122 Type 2 diabetes mellitus with diabetic chronic kidney disease: Secondary | ICD-10-CM | POA: Diagnosis not present

## 2020-08-22 DIAGNOSIS — I110 Hypertensive heart disease with heart failure: Secondary | ICD-10-CM | POA: Diagnosis not present

## 2020-08-22 DIAGNOSIS — E8809 Other disorders of plasma-protein metabolism, not elsewhere classified: Secondary | ICD-10-CM | POA: Diagnosis not present

## 2020-08-22 DIAGNOSIS — R140 Abdominal distension (gaseous): Secondary | ICD-10-CM | POA: Diagnosis not present

## 2020-08-22 DIAGNOSIS — D631 Anemia in chronic kidney disease: Secondary | ICD-10-CM | POA: Diagnosis not present

## 2020-08-22 DIAGNOSIS — K8020 Calculus of gallbladder without cholecystitis without obstruction: Secondary | ICD-10-CM | POA: Diagnosis not present

## 2020-08-22 DIAGNOSIS — Z6841 Body Mass Index (BMI) 40.0 and over, adult: Secondary | ICD-10-CM | POA: Diagnosis not present

## 2020-08-22 DIAGNOSIS — E1142 Type 2 diabetes mellitus with diabetic polyneuropathy: Secondary | ICD-10-CM | POA: Diagnosis present

## 2020-08-22 DIAGNOSIS — R609 Edema, unspecified: Secondary | ICD-10-CM | POA: Diagnosis not present

## 2020-08-22 DIAGNOSIS — I2510 Atherosclerotic heart disease of native coronary artery without angina pectoris: Secondary | ICD-10-CM | POA: Diagnosis not present

## 2020-08-22 DIAGNOSIS — R600 Localized edema: Secondary | ICD-10-CM | POA: Diagnosis not present

## 2020-08-22 DIAGNOSIS — I509 Heart failure, unspecified: Secondary | ICD-10-CM | POA: Diagnosis not present

## 2020-08-22 DIAGNOSIS — N401 Enlarged prostate with lower urinary tract symptoms: Secondary | ICD-10-CM | POA: Diagnosis not present

## 2020-08-22 DIAGNOSIS — K219 Gastro-esophageal reflux disease without esophagitis: Secondary | ICD-10-CM | POA: Diagnosis present

## 2020-08-22 DIAGNOSIS — R933 Abnormal findings on diagnostic imaging of other parts of digestive tract: Secondary | ICD-10-CM | POA: Diagnosis not present

## 2020-08-22 HISTORY — DX: Acute on chronic diastolic (congestive) heart failure: I50.33

## 2020-08-22 HISTORY — DX: Other pericardial effusion (noninflammatory): I31.39

## 2020-08-22 LAB — BASIC METABOLIC PANEL
Anion gap: 8 (ref 5–15)
BUN: 32 mg/dL — ABNORMAL HIGH (ref 6–20)
CO2: 24 mmol/L (ref 22–32)
Calcium: 8.2 mg/dL — ABNORMAL LOW (ref 8.9–10.3)
Chloride: 107 mmol/L (ref 98–111)
Creatinine, Ser: 2.04 mg/dL — ABNORMAL HIGH (ref 0.61–1.24)
GFR, Estimated: 37 mL/min — ABNORMAL LOW (ref >60)
Glucose, Bld: 154 mg/dL — ABNORMAL HIGH (ref 70–99)
Potassium: 3 mmol/L — ABNORMAL LOW (ref 3.5–5.1)
Sodium: 139 mmol/L (ref 135–145)

## 2020-08-22 LAB — ECHOCARDIOGRAM LIMITED
AV Mean grad: 9 mmHg
AV Peak grad: 16.7 mmHg
Ao pk vel: 2.05 m/s
Area-P 1/2: 2.93 cm2
Calc EF: 67.2 %
Height: 68 in
S' Lateral: 2.97 cm
Single Plane A2C EF: 47.6 %
Single Plane A4C EF: 78.8 %
Weight: 5280 [oz_av]

## 2020-08-22 LAB — RESP PANEL BY RT-PCR (FLU A&B, COVID) ARPGX2
Influenza A by PCR: NEGATIVE
Influenza B by PCR: NEGATIVE
SARS Coronavirus 2 by RT PCR: NEGATIVE

## 2020-08-22 LAB — MRSA PCR SCREENING: MRSA by PCR: NEGATIVE

## 2020-08-22 LAB — GLUCOSE, CAPILLARY
Glucose-Capillary: 139 mg/dL — ABNORMAL HIGH (ref 70–99)
Glucose-Capillary: 162 mg/dL — ABNORMAL HIGH (ref 70–99)

## 2020-08-22 LAB — CBG MONITORING, ED
Glucose-Capillary: 150 mg/dL — ABNORMAL HIGH (ref 70–99)
Glucose-Capillary: 175 mg/dL — ABNORMAL HIGH (ref 70–99)

## 2020-08-22 MED ORDER — ACETAMINOPHEN 325 MG PO TABS
650.0000 mg | ORAL_TABLET | ORAL | Status: DC | PRN
  Administered 2020-08-24 – 2020-09-15 (×3): 650 mg via ORAL
  Filled 2020-08-22 (×3): qty 2

## 2020-08-22 MED ORDER — SACCHAROMYCES BOULARDII 250 MG PO CAPS
250.0000 mg | ORAL_CAPSULE | Freq: Two times a day (BID) | ORAL | Status: DC
  Administered 2020-08-22 – 2020-09-16 (×50): 250 mg via ORAL
  Filled 2020-08-22 (×50): qty 1

## 2020-08-22 MED ORDER — SODIUM CHLORIDE 0.9% FLUSH
3.0000 mL | INTRAVENOUS | Status: DC | PRN
  Administered 2020-09-04 – 2020-09-18 (×2): 3 mL via INTRAVENOUS

## 2020-08-22 MED ORDER — SODIUM CHLORIDE 0.9% FLUSH
3.0000 mL | Freq: Two times a day (BID) | INTRAVENOUS | Status: DC
  Administered 2020-08-22 – 2020-09-22 (×59): 3 mL via INTRAVENOUS

## 2020-08-22 MED ORDER — LOPERAMIDE HCL 2 MG PO CAPS
2.0000 mg | ORAL_CAPSULE | Freq: Once | ORAL | Status: AC
  Administered 2020-08-22: 2 mg via ORAL
  Filled 2020-08-22: qty 1

## 2020-08-22 MED ORDER — CARVEDILOL 12.5 MG PO TABS
12.5000 mg | ORAL_TABLET | Freq: Two times a day (BID) | ORAL | Status: DC
  Administered 2020-08-22 – 2020-09-22 (×62): 12.5 mg via ORAL
  Filled 2020-08-22 (×62): qty 1

## 2020-08-22 MED ORDER — LABETALOL HCL 5 MG/ML IV SOLN
10.0000 mg | INTRAVENOUS | Status: DC | PRN
  Administered 2020-08-22: 10 mg via INTRAVENOUS
  Filled 2020-08-22: qty 4

## 2020-08-22 MED ORDER — PERFLUTREN LIPID MICROSPHERE
1.0000 mL | INTRAVENOUS | Status: AC | PRN
  Administered 2020-08-22: 2 mL via INTRAVENOUS
  Filled 2020-08-22: qty 10

## 2020-08-22 MED ORDER — SERTRALINE HCL 50 MG PO TABS
50.0000 mg | ORAL_TABLET | Freq: Every day | ORAL | Status: DC
  Administered 2020-08-23 – 2020-08-27 (×3): 50 mg via ORAL
  Filled 2020-08-22 (×7): qty 1

## 2020-08-22 MED ORDER — ENOXAPARIN SODIUM 40 MG/0.4ML IJ SOSY
40.0000 mg | PREFILLED_SYRINGE | INTRAMUSCULAR | Status: DC
  Administered 2020-08-22 – 2020-09-22 (×32): 40 mg via SUBCUTANEOUS
  Filled 2020-08-22 (×32): qty 0.4

## 2020-08-22 MED ORDER — INSULIN ASPART 100 UNIT/ML IJ SOLN
0.0000 [IU] | Freq: Three times a day (TID) | INTRAMUSCULAR | Status: DC
  Administered 2020-08-22: 1 [IU] via SUBCUTANEOUS
  Administered 2020-08-22: 2 [IU] via SUBCUTANEOUS
  Administered 2020-08-22 – 2020-08-24 (×6): 1 [IU] via SUBCUTANEOUS
  Administered 2020-08-25: 2 [IU] via SUBCUTANEOUS
  Administered 2020-08-25: 1 [IU] via SUBCUTANEOUS
  Administered 2020-08-26 – 2020-08-27 (×3): 2 [IU] via SUBCUTANEOUS
  Administered 2020-08-27: 1 [IU] via SUBCUTANEOUS
  Administered 2020-08-28: 2 [IU] via SUBCUTANEOUS
  Administered 2020-08-29 – 2020-08-31 (×6): 1 [IU] via SUBCUTANEOUS
  Administered 2020-09-01 (×2): 2 [IU] via SUBCUTANEOUS
  Administered 2020-09-01 – 2020-09-02 (×2): 1 [IU] via SUBCUTANEOUS
  Administered 2020-09-02 – 2020-09-03 (×5): 2 [IU] via SUBCUTANEOUS
  Administered 2020-09-04: 1 [IU] via SUBCUTANEOUS
  Administered 2020-09-04: 2 [IU] via SUBCUTANEOUS
  Administered 2020-09-04: 1 [IU] via SUBCUTANEOUS
  Administered 2020-09-05 (×3): 2 [IU] via SUBCUTANEOUS
  Administered 2020-09-06 (×2): 9 [IU] via SUBCUTANEOUS
  Administered 2020-09-06: 3 [IU] via SUBCUTANEOUS
  Administered 2020-09-07 (×2): 2 [IU] via SUBCUTANEOUS
  Administered 2020-09-07: 3 [IU] via SUBCUTANEOUS
  Administered 2020-09-08 (×2): 2 [IU] via SUBCUTANEOUS
  Administered 2020-09-08: 1 [IU] via SUBCUTANEOUS
  Administered 2020-09-09: 2 [IU] via SUBCUTANEOUS
  Administered 2020-09-09: 1 [IU] via SUBCUTANEOUS
  Administered 2020-09-09 – 2020-09-10 (×3): 2 [IU] via SUBCUTANEOUS
  Administered 2020-09-10: 3 [IU] via SUBCUTANEOUS
  Administered 2020-09-11 – 2020-09-12 (×4): 2 [IU] via SUBCUTANEOUS
  Administered 2020-09-12: 1 [IU] via SUBCUTANEOUS
  Administered 2020-09-12 – 2020-09-13 (×2): 2 [IU] via SUBCUTANEOUS
  Administered 2020-09-13: 3 [IU] via SUBCUTANEOUS
  Administered 2020-09-13 – 2020-09-14 (×2): 2 [IU] via SUBCUTANEOUS
  Administered 2020-09-14: 3 [IU] via SUBCUTANEOUS
  Administered 2020-09-14 – 2020-09-15 (×3): 2 [IU] via SUBCUTANEOUS
  Administered 2020-09-15: 3 [IU] via SUBCUTANEOUS
  Administered 2020-09-16: 2 [IU] via SUBCUTANEOUS
  Administered 2020-09-16: 1 [IU] via SUBCUTANEOUS
  Administered 2020-09-16 – 2020-09-17 (×2): 2 [IU] via SUBCUTANEOUS
  Administered 2020-09-17: 1 [IU] via SUBCUTANEOUS
  Administered 2020-09-17 – 2020-09-18 (×2): 2 [IU] via SUBCUTANEOUS
  Administered 2020-09-18 (×2): 3 [IU] via SUBCUTANEOUS
  Administered 2020-09-19 (×2): 2 [IU] via SUBCUTANEOUS
  Administered 2020-09-19: 3 [IU] via SUBCUTANEOUS
  Administered 2020-09-20: 2 [IU] via SUBCUTANEOUS
  Administered 2020-09-20: 3 [IU] via SUBCUTANEOUS
  Administered 2020-09-20: 1 [IU] via SUBCUTANEOUS
  Administered 2020-09-21 (×2): 2 [IU] via SUBCUTANEOUS
  Administered 2020-09-21 – 2020-09-22 (×2): 1 [IU] via SUBCUTANEOUS
  Administered 2020-09-22: 2 [IU] via SUBCUTANEOUS
  Filled 2020-08-22: qty 0.09

## 2020-08-22 MED ORDER — ISOSORB DINITRATE-HYDRALAZINE 20-37.5 MG PO TABS
1.0000 | ORAL_TABLET | Freq: Three times a day (TID) | ORAL | Status: DC
  Administered 2020-08-22 – 2020-09-22 (×91): 1 via ORAL
  Filled 2020-08-22 (×93): qty 1

## 2020-08-22 MED ORDER — INSULIN ASPART 100 UNIT/ML IJ SOLN
3.0000 [IU] | Freq: Three times a day (TID) | INTRAMUSCULAR | Status: DC
  Administered 2020-08-22 – 2020-09-06 (×39): 3 [IU] via SUBCUTANEOUS
  Filled 2020-08-22: qty 0.03

## 2020-08-22 MED ORDER — PREGABALIN 75 MG PO CAPS
75.0000 mg | ORAL_CAPSULE | Freq: Two times a day (BID) | ORAL | Status: DC
  Administered 2020-08-22 – 2020-09-22 (×63): 75 mg via ORAL
  Filled 2020-08-22 (×63): qty 1

## 2020-08-22 MED ORDER — ATORVASTATIN CALCIUM 40 MG PO TABS
80.0000 mg | ORAL_TABLET | Freq: Every day | ORAL | Status: DC
  Administered 2020-08-22 – 2020-09-21 (×31): 80 mg via ORAL
  Filled 2020-08-22 (×31): qty 2

## 2020-08-22 MED ORDER — ASPIRIN 81 MG PO CHEW
81.0000 mg | CHEWABLE_TABLET | Freq: Every day | ORAL | Status: DC
  Administered 2020-08-22 – 2020-09-22 (×32): 81 mg via ORAL
  Filled 2020-08-22 (×32): qty 1

## 2020-08-22 MED ORDER — PANTOPRAZOLE SODIUM 40 MG PO TBEC
40.0000 mg | DELAYED_RELEASE_TABLET | Freq: Every day | ORAL | Status: DC
  Administered 2020-08-22 – 2020-09-22 (×31): 40 mg via ORAL
  Filled 2020-08-22 (×32): qty 1

## 2020-08-22 MED ORDER — AMLODIPINE BESYLATE 10 MG PO TABS
10.0000 mg | ORAL_TABLET | Freq: Every day | ORAL | Status: DC
  Administered 2020-08-22 – 2020-09-22 (×32): 10 mg via ORAL
  Filled 2020-08-22 (×22): qty 1
  Filled 2020-08-22: qty 2
  Filled 2020-08-22 (×9): qty 1

## 2020-08-22 MED ORDER — TAMSULOSIN HCL 0.4 MG PO CAPS
0.8000 mg | ORAL_CAPSULE | Freq: Every day | ORAL | Status: DC
  Administered 2020-08-22 – 2020-09-22 (×32): 0.8 mg via ORAL
  Filled 2020-08-22 (×32): qty 2

## 2020-08-22 MED ORDER — ONDANSETRON HCL 4 MG/2ML IJ SOLN
4.0000 mg | Freq: Four times a day (QID) | INTRAMUSCULAR | Status: DC | PRN
  Administered 2020-08-27 – 2020-09-04 (×4): 4 mg via INTRAVENOUS
  Filled 2020-08-22 (×4): qty 2

## 2020-08-22 MED ORDER — POLYETHYLENE GLYCOL 3350 17 G PO PACK: 17.0000 g | PACK | Freq: Every day | ORAL | Status: DC | PRN

## 2020-08-22 MED ORDER — INSULIN GLARGINE 100 UNIT/ML ~~LOC~~ SOLN
20.0000 [IU] | Freq: Every day | SUBCUTANEOUS | Status: DC
  Administered 2020-08-22 – 2020-09-05 (×15): 20 [IU] via SUBCUTANEOUS
  Filled 2020-08-22 (×16): qty 0.2

## 2020-08-22 MED ORDER — SODIUM CHLORIDE 0.9 % IV SOLN
250.0000 mL | INTRAVENOUS | Status: DC | PRN
  Administered 2020-08-30: 250 mL via INTRAVENOUS

## 2020-08-22 MED ORDER — SPIRONOLACTONE 25 MG PO TABS
25.0000 mg | ORAL_TABLET | Freq: Every day | ORAL | Status: DC
  Administered 2020-08-22 – 2020-08-24 (×3): 25 mg via ORAL
  Filled 2020-08-22 (×4): qty 1

## 2020-08-22 MED ORDER — FUROSEMIDE 10 MG/ML IJ SOLN
40.0000 mg | Freq: Two times a day (BID) | INTRAMUSCULAR | Status: DC
  Administered 2020-08-22 – 2020-08-25 (×7): 40 mg via INTRAVENOUS
  Filled 2020-08-22 (×7): qty 4

## 2020-08-22 NOTE — Assessment & Plan Note (Signed)
--   Continue diabetic treatment.  Not having any particular medications for this.

## 2020-08-22 NOTE — Assessment & Plan Note (Signed)
--   Last hemoglobin A1c 9.1 in May of this year. -- Currently stable, continue sliding scale insulin, Lantus

## 2020-08-22 NOTE — Assessment & Plan Note (Signed)
--  moderate per CT 6/24. No s/s of tamponade. Will f/u cardiology opinion.

## 2020-08-22 NOTE — Hospital Course (Addendum)
60 year old male with chronic diastolic CHF, diabetes, morbid obesity, history of presented with chest pain, shortness of breath, and swelling.  He was found to have acute on chronic diastolic CHF with anasarca, was placed on IV Lasix 60 and was admitted.  He also had AKI and creatinine improved with IV diuresis.  He developed colonic ileus that improved with conservative management.  He was seen by PT OT who have advised skilled nursing facility.  His fluid status improved with IV Lasix.We will continue with home chf meds including torsemide. Continue fluid restriction salt restriction.He is being discharged to skilled nursing facility pending bed available.  With ongoing persistent swelling his torsemide was increased to 40 mg twice daily having good diuresis and weight is significantly improving.

## 2020-08-22 NOTE — Consult Note (Signed)
CARDIOLOGY CONSULT NOTE  Patient ID: [name] MRN: [id number] DOB/AGE: [birth year] 60 y.o.  Admit date: 08/21/2020 Referring Physician  Samuella Cota, MD Primary Physician:  [name], NP Reason for Consultation: Acute on chronic HFpEF and pericardial effusion  Patient ID: [name], male    DOB: April 16, 1960, 60 y.o.   MRN: [id number]  Chief Complaint  Patient presents with   Chest Pain   Leg Swelling   HPI:    [name]  is a 60 y.o. African-American male with history of known CAD with subtotal occlusion of OM1 (cath 06/2019 at [facility]), hypertension, family history of premature CAD, hyperlipidemia, HFpEF, diabetes mellitus type 2, history of CVA with right-sided residual deficits, history of polysubstance abuse, obesity.   Patient was hospitalized 06/30/20-07/05/2020 with complaints of chest pain and shortness of breath.He had previously been admitted 06/25/2020 - 06/28/2020 with similar complaints at which time he underwent echocardiogram and nuclear stress test.  Echocardiogram revealed preserved LVEF and stress test was overall low risk.  Patient was seen in the office 08/08/2020 at which time spironolactone 25 mg once daily was added and carvedilol was uptitrated to 25 mg twice daily.  Since office visit patient has presented to the emergency department on 08/16/2020 and 08/18/2020 with worsening edema.  He now presents to [facility] long ED with complaints of chest pain and worsening shortness of breath.  Patient states the chest pain began earlier today which she rates a 10 out of 10 severity.  Shortness of breath has been worsening over the last several days.  Evaluation in the emergency department revealed creatinine had increased from 1.53 days ago to 2.0 and troponin minimally elevated but flat at 16--> 20, as well as BNP elevated at 226. Notably CT scan done on 08/18/2020 during patient's emergency department evaluation noted moderate sized pericardial effusion which had  increased compared to previous CT.   At the time of my examination patient reports his chest pain had improved to 6-08/2008 severity.  Blood pressure was significantly elevated at presentation 212/111 mmHg, has now improved to 135/89 mmHg.  Patient has had many visits to the emergency department and hospitalizations since January of this year, including recurrent hospitalizations for chest pain and shortness of breath.  It is unclear whether patient is truly compliant with medication regimens on an outpatient basis.  He lives in an assisted living facility and states that they manage his medications for him, therefore it is difficult to ascertain what exactly he is taking at home.  However on his most recent office visit spent a significant amount of time 60 y.o. coordinating care with facility and updating his medication list with them.  Past Medical History:  Diagnosis Date   Arthritis    Bipolar affective ([place])    CHF (congestive heart failure) ([place])    Claustrophobia    Severe   Depression    Diabetes mellitus    uncontrolled    Heart murmur    Hypertension    Past Surgical History:  Procedure Laterality Date   BOWEL DECOMPRESSION N/A 01/19/2020   Procedure: BOWEL DECOMPRESSION;  Surgeon: Otis Brace, MD;  Location: [place];  Service: Gastroenterology;  Laterality: N/A;   COLONOSCOPY WITH PROPOFOL N/A 01/19/2020   Procedure: COLONOSCOPY WITH PROPOFOL;  Surgeon: Otis Brace, MD;  Location: [place];  Service: Gastroenterology;  Laterality: N/A;   KNEE SURGERY     b/l knees; pending total knee    RADIOLOGY WITH ANESTHESIA N/A 07/04/2020   Procedure: MRI  WITH ANESTHESIA BRAIN WITHOUT CONTRAST AND HEAD WITHOUT CONTRAST;  Surgeon: Radiologist, Medication, MD;  Location: [place];  Service: Radiology;  Laterality: N/A;   Family History  Problem Relation Age of Onset   Diabetes Other    Cancer Mother        uterus; deceased    Heart attack Mother 75   Hypertension Other     Heart attack Father 33   Stroke Sister        12/2012 age 43 y.o   Social History   Tobacco Use   Smoking status: Former    Pack years: 0.00   Smokeless tobacco: Never  Substance Use Topics   Alcohol use: No    Marital Sttus: Single  ROS  Review of Systems  Constitutional: Positive for weight gain. Negative for malaise/fatigue.  Cardiovascular:  Positive for chest pain, leg swelling and orthopnea. Negative for claudication, near-syncope, palpitations, paroxysmal nocturnal dyspnea and syncope.  Respiratory:  Positive for shortness of breath.   Neurological:  Negative for dizziness.  All other systems reviewed and are negative. Objective   Vitals with BMI 08/22/2020 08/22/2020 08/22/2020  Height - - -  Weight - - -  BMI - - -  Systolic [phone]  Diastolic 89 97 481  Pulse 72 75 -  Some encounter information is confidential and restricted. Go to Review Flowsheets activity to see all data.    Blood pressure 138/89, pulse 72, temperature 97.7 F (36.5 C), temperature source Oral, resp. rate 13, height 5\' 8"  (1.727 m), weight (!) 149.7 kg, SpO2 100 %.    Physical Exam Vitals and nursing note reviewed.  Constitutional:      General: He is not in acute distress.    Appearance: He is obese.  HENT:     Head: Normocephalic and atraumatic.     Right Ear: External ear normal.     Left Ear: External ear normal.     Nose: Nose normal.     Mouth/Throat:     Mouth: Mucous membranes are moist.  Eyes:     Extraocular Movements: Extraocular movements intact.     Conjunctiva/sclera: Conjunctivae normal.  Cardiovascular:     Heart sounds: Murmur heard.  Blowing holosystolic murmur is present with a grade of 2/6 at the apex.     Comments: Scrotal edema Pulmonary:     Effort: Pulmonary effort is normal.     Comments: Decreased air movement bilateral bases Abdominal:     General: Bowel sounds are normal. There is no distension.  Musculoskeletal:     Right lower leg: Edema (2+  pitting to thigh) present.     Left lower leg: Edema (2+ pitting to thigh) present.  Skin:    General: Skin is warm and dry.     Comments: Chronic venous stasis changes bilateral lower extremities.  Neurological:     Mental Status: He is alert and oriented to person, place, and time. Mental status is at baseline.     Comments: Residual right-sided weakness.No new focal deficits   Psychiatric:        Mood and Affect: Mood normal.        Behavior: Behavior normal.   Laboratory examination:   Recent Labs    08/18/20 0123 08/21/20 1646 08/22/20 0615  NA 136 136 139  K 3.5 3.1* 3.0*  CL 103 106 107  CO2 23 23 24   GLUCOSE 147* 189* 154*  BUN 35* 34* 32*  CREATININE 1.51* 2.05* 2.04*  CALCIUM 8.6*  8.3* 8.2*  GFRNONAA 53* 37* 37*   estimated creatinine clearance is 55.6 mL/min (A) (by C-G formula based on SCr of 2.04 mg/dL (H)).  CMP Latest Ref Rng & Units 08/22/2020 08/21/2020 08/18/2020  Glucose 70 - 99 mg/dL 154(H) 189(H) 147(H)  BUN 6 - 20 mg/dL 32(H) 34(H) 35(H)  Creatinine 0.61 - 1.24 mg/dL 2.04(H) 2.05(H) 1.51(H)  Sodium 135 - 145 mmol/L 139 136 136  Potassium 3.5 - 5.1 mmol/L 3.0(L) 3.1(L) 3.5  Chloride 98 - 111 mmol/L 107 106 103  CO2 22 - 32 mmol/L 24 23 23   Calcium 8.9 - 10.3 mg/dL 8.2(L) 8.3(L) 8.6(L)  Total Protein 6.5 - 8.1 g/dL - 6.6 -  Total Bilirubin 0.3 - 1.2 mg/dL - 0.6 -  Alkaline Phos 38 - 126 U/L - 88 -  AST 15 - 41 U/L - 18 -  ALT 0 - 44 U/L - 14 -   CBC Latest Ref Rng & Units 08/21/2020 08/18/2020 08/16/2020  WBC 4.0 - 10.5 K/uL 3.4(L) 4.1 4.0  Hemoglobin 13.0 - 17.0 g/dL 10.4(L) 9.8(L) 9.2(L)  Hematocrit 39.0 - 52.0 % 33.5(L) 31.5(L) 30.5(L)  Platelets 150 - 400 K/uL 252 245 244   Lipid Panel Recent Labs    01/26/20 0205 06/25/20 0711  CHOL  --  305*  TRIG 71 90  LDLCALC  --  224*  VLDL  --  18  HDL  --  63  CHOLHDL  --  4.8    HEMOGLOBIN A1C Lab Results  Component Value Date   HGBA1C 9.1 (H) 06/25/2020   MPG 214.47 06/25/2020    TSH Recent Labs    01/01/20 0849 01/18/20 0517 06/25/20 1244  TSH 1.354 2.317 1.714   BNP (last 3 results) Recent Labs    08/16/20 1025 08/18/20 0123 08/21/20 1801  BNP 245.3* 139.2* 226.2*   Results for orders placed or performed during the hospital encounter of 08/21/20 (from the past 48 hour(s))  Comprehensive metabolic panel     Status: Abnormal   Collection Time: 08/21/20  4:46 PM  Result Value Ref Range   Sodium 136 135 - 145 mmol/L   Potassium 3.1 (L) 3.5 - 5.1 mmol/L   Chloride 106 98 - 111 mmol/L   CO2 23 22 - 32 mmol/L   Glucose, Bld 189 (H) 70 - 99 mg/dL    Comment: Glucose reference range applies only to samples taken after fasting for at least 8 hours.   BUN 34 (H) 6 - 20 mg/dL   Creatinine, Ser 2.05 (H) 0.61 - 1.24 mg/dL   Calcium 8.3 (L) 8.9 - 10.3 mg/dL   Total Protein 6.6 6.5 - 8.1 g/dL   Albumin 2.4 (L) 3.5 - 5.0 g/dL   AST 18 15 - 41 U/L   ALT 14 0 - 44 U/L   Alkaline Phosphatase 88 38 - 126 U/L   Total Bilirubin 0.6 0.3 - 1.2 mg/dL   GFR, Estimated 37 (L) >60 mL/min    Comment: (NOTE) Calculated using the CKD-EPI Creatinine Equation (2021)    Anion gap 7 5 - 15    Comment: Performed at [facility], [place] [address], [place], [place] [address]  Troponin I (High Sensitivity)     Status: None   Collection Time: 08/21/20  4:46 PM  Result Value Ref Range   Troponin I (High Sensitivity) 16 <18 ng/L    Comment: (NOTE) Elevated high sensitivity troponin I (hsTnI) values and significant  changes across serial measurements may suggest ACS but  many other  chronic and acute conditions are known to elevate hsTnI results.  Refer to the "Links" section for chest pain algorithms and additional  guidance. Performed at [facility], [place] [address] [place], [place] [address]   Brain natriuretic peptide     Status: Abnormal   Collection Time: 08/21/20  6:01 PM  Result Value Ref Range   B Natriuretic Peptide 226.2 (H)  0.0 - 100.0 pg/mL    Comment: Performed at [facility], [place] [address], [place], [place] [address]  Troponin I (High Sensitivity)     Status: Abnormal   Collection Time: 08/21/20  6:01 PM  Result Value Ref Range   Troponin I (High Sensitivity) 20 (H) <18 ng/L    Comment: (NOTE) Elevated high sensitivity troponin I (hsTnI) values and significant  changes across serial measurements may suggest ACS but many other  chronic and acute conditions are known to elevate hsTnI results.  Refer to the "Links" section for chest pain algorithms and additional  guidance. Performed at [facility], [place] [address] [place], [place] [address]   CBC with Differential/Platelet     Status: Abnormal   Collection Time: 08/21/20  6:24 PM  Result Value Ref Range   WBC 3.4 (L) 4.0 - 10.5 K/uL   RBC 4.20 (L) 4.22 - 5.81 MIL/uL   Hemoglobin 10.4 (L) 13.0 - 17.0 g/dL   HCT 33.5 (L) 39.0 - 52.0 %   MCV 79.8 (L) 80.0 - 100.0 fL   MCH 24.8 (L) 26.0 - 34.0 pg   MCHC 31.0 30.0 - 36.0 g/dL   RDW 15.0 11.5 - 15.5 %   Platelets 252 150 - 400 K/uL   nRBC 0.0 0.0 - 0.2 %   Neutrophils Relative % 51 %   Neutro Abs 1.8 1.7 - 7.7 K/uL   Lymphocytes Relative 28 %   Lymphs Abs 1.0 0.7 - 4.0 K/uL   Monocytes Relative 13 %   Monocytes Absolute 0.5 0.1 - 1.0 K/uL   Eosinophils Relative 7 %   Eosinophils Absolute 0.2 0.0 - 0.5 K/uL   Basophils Relative 1 %   Basophils Absolute 0.0 0.0 - 0.1 K/uL   Immature Granulocytes 0 %   Abs Immature Granulocytes 0.01 0.00 - 0.07 K/uL    Comment: Performed at [facility], [place] [address] [place], [place] [address]  Resp Panel by RT-PCR (Flu A&B, Covid) Nasopharyngeal Swab     Status: None   Collection Time: 08/21/20 11:31 PM   Specimen: Nasopharyngeal Swab; Nasopharyngeal(NP) swabs in vial transport medium  Result Value Ref Range   SARS Coronavirus 2 by RT PCR NEGATIVE NEGATIVE    Comment: (NOTE) SARS-CoV-2 target nucleic  acids are NOT DETECTED.  The SARS-CoV-2 RNA is generally detectable in upper respiratory specimens during the acute phase of infection. The lowest concentration of SARS-CoV-2 viral copies this assay can detect is 138 copies/mL. A negative result does not preclude SARS-Cov-2 infection and should not be used as the sole basis for treatment or other patient management decisions. A negative result may occur with  improper specimen collection/handling, submission of specimen other than nasopharyngeal swab, presence of viral mutation(s) within the areas targeted by this assay, and inadequate number of viral copies(<138 copies/mL). A negative result must be combined with clinical observations, patient history, and epidemiological information. The expected result is Negative.  Fact Sheet for Patients:  EntrepreneurPulse.com.au  Fact Sheet for Healthcare Providers:  IncredibleEmployment.be  This test is no  t yet approved or cleared by the [place] and  has been authorized for detection and/or diagnosis of SARS-CoV-2 by FDA under an Emergency Use Authorization (EUA). This EUA will remain  in effect (meaning this test can be used) for the duration of the COVID-19 declaration under Section 564(b)(1) of the Act, 21 U.S.C.section 360bbb-3(b)(1), unless the authorization is terminated  or revoked sooner.       Influenza A by PCR NEGATIVE NEGATIVE   Influenza B by PCR NEGATIVE NEGATIVE    Comment: (NOTE) The Xpert Xpress SARS-CoV-2/FLU/RSV plus assay is intended as an aid in the diagnosis of influenza from Nasopharyngeal swab specimens and should not be used as a sole basis for treatment. Nasal washings and aspirates are unacceptable for Xpert Xpress SARS-CoV-2/FLU/RSV testing.  Fact Sheet for Patients: EntrepreneurPulse.com.au  Fact Sheet for Healthcare Providers: IncredibleEmployment.be  This test is not  yet approved or cleared by the [place] FDA and has been authorized for detection and/or diagnosis of SARS-CoV-2 by FDA under an Emergency Use Authorization (EUA). This EUA will remain in effect (meaning this test can be used) for the duration of the COVID-19 declaration under Section 564(b)(1) of the Act, 21 U.S.C. section 360bbb-3(b)(1), unless the authorization is terminated or revoked.  Performed at [name], [place] [address], [place], [place] [address]   Basic metabolic panel     Status: Abnormal   Collection Time: 08/22/20  6:15 AM  Result Value Ref Range   Sodium 139 135 - 145 mmol/L   Potassium 3.0 (L) 3.5 - 5.1 mmol/L   Chloride 107 98 - 111 mmol/L   CO2 24 22 - 32 mmol/L   Glucose, Bld 154 (H) 70 - 99 mg/dL    Comment: Glucose reference range applies only to samples taken after fasting for at least 8 hours.   BUN 32 (H) 6 - 20 mg/dL   Creatinine, Ser 2.04 (H) 0.61 - 1.24 mg/dL   Calcium 8.2 (L) 8.9 - 10.3 mg/dL   GFR, Estimated 37 (L) >60 mL/min    Comment: (NOTE) Calculated using the CKD-EPI Creatinine Equation (2021)    Anion gap 8 5 - 15    Comment: Performed at [facility], [place] [address] [place], [place] [address]  CBG monitoring, ED     Status: Abnormal   Collection Time: 08/22/20  7:40 AM  Result Value Ref Range   Glucose-Capillary 150 (H) 70 - 99 mg/dL    Comment: Glucose reference range applies only to samples taken after fasting for at least 8 hours.  CBG monitoring, ED     Status: Abnormal   Collection Time: 08/22/20 11:18 AM  Result Value Ref Range   Glucose-Capillary 175 (H) 70 - 99 mg/dL    Comment: Glucose reference range applies only to samples taken after fasting for at least 8 hours.    Medications and allergies   Allergies  Allergen Reactions   Other Anaphylaxis, Itching and Swelling    Allergy to GRITS , unknown exactly the specific ingredient he is allergic too     Current Meds  Medication  Sig   amLODipine (NORVASC) 10 MG tablet Take 1 tablet (10 mg total) by mouth daily.   ammonium lactate (LAC-HYDRIN) 12 % lotion Apply 1 application topically 2 (two) times daily. Both legs   aspirin 81 MG chewable tablet Chew 1 tablet (81 mg total) by mouth daily.   atorvastatin (LIPITOR) 80 MG tablet Take 1 tablet (80 mg total) by  mouth at bedtime.   carvedilol (COREG) 12.5 MG tablet Take 2 tablets (25 mg total) by mouth 2 (two) times daily with a meal. (Patient taking differently: Take 12.5 mg by mouth 2 (two) times daily with a meal.)   Ensure (ENSURE) Take 237 mLs by mouth 2 (two) times daily between meals.   insulin aspart (NOVOLOG) 100 UNIT/ML injection Inject 12 Units into the skin 3 (three) times daily before meals. For blood sugars 0-150 give 0 units of insulin, 151-200 give 2 units of insulin, 201-250 give 4 units, 251-300 give 6 units, 301-350 give 8 units, 351-400 give 10 units,> 400 give 12 units and call M.D. (Patient taking differently: Inject 2-12 Units into the skin See admin instructions. Check and record blood sugar before meals and inject per ssi. 151-200 = 2 units  201-250 = 4 units  251-300 = 6 units  301-350 = 8 units  351-400 = 10 units  greater than 400 = 12 units and call MD.)   Insulin Glargine (BASAGLAR KWIKPEN) 100 UNIT/ML Inject 30 Units into the skin at bedtime.   NON FORMULARY Take 237 mLs by mouth in the morning and at bedtime. ensure drink   ondansetron (ZOFRAN ODT) 4 MG disintegrating tablet 4mg  ODT q4 hours prn nausea/vomit (Patient taking differently: Take 4 mg by mouth every 4 (four) hours as needed for nausea or vomiting.)   pantoprazole (PROTONIX) 40 MG tablet Take 40 mg by mouth daily.   polyethylene glycol (MIRALAX / GLYCOLAX) 17 g packet Take 17 g by mouth daily as needed. (Patient taking differently: Take 17 g by mouth daily as needed for mild constipation.)   pregabalin (LYRICA) 75 MG capsule Take 1 capsule (75 mg total) by mouth 2 (two) times daily.    sacubitril-valsartan (ENTRESTO) 49-51 MG Take 1 tablet by mouth 2 (two) times daily.   sertraline (ZOLOFT) 50 MG tablet Take 50 mg by mouth daily.   tamsulosin (FLOMAX) 0.4 MG CAPS capsule Take 2 capsules (0.8 mg total) by mouth daily.   torsemide (DEMADEX) 20 MG tablet Take 2 tablets (40 mg total) by mouth daily.    Scheduled Meds:  amLODipine  10 mg Oral Daily   aspirin  81 mg Oral Daily   atorvastatin  80 mg Oral QHS   carvedilol  12.5 mg Oral BID WC   enoxaparin (LOVENOX) injection  40 mg Subcutaneous Q24H   furosemide  40 mg Intravenous BID   insulin aspart  0-9 Units Subcutaneous TID WC   insulin aspart  3 Units Subcutaneous TID WC   insulin glargine  20 Units Subcutaneous QHS   pantoprazole  40 mg Oral Daily   pregabalin  75 mg Oral BID   sertraline  50 mg Oral Daily   sodium chloride flush  3 mL Intravenous Q12H   spironolactone  25 mg Oral Daily   tamsulosin  0.8 mg Oral Daily   Continuous Infusions:  sodium chloride     PRN Meds:.sodium chloride, acetaminophen, labetalol, ondansetron (ZOFRAN) IV, perflutren lipid microspheres (DEFINITY) IV suspension, polyethylene glycol, sodium chloride flush   No intake/output data recorded. No intake/output data recorded.    Radiology:  CT of the head for code stroke without contrast 07/02/2020: 1. Evidence of small vessel ischemia in the left caudate and the left thalamus, with the latter more apparent than on a Head CT 1 week ago. 2. No acute cortically based infarct or acute intracranial hemorrhage identified. ASPECTS 10.   CT abdomen pelvis 08/18/2020: 1. Extensive diffuse  subcutaneous edema, mildly increased. 2. Partially included bilateral scrotal hydroceles. 3. Moderate-sized pericardial effusion, increased. 4. Increased stool in the rectum and distal sigmoid colon, causing partial functional obstruction of the colon. 5. Cholelithiasis without evidence of cholecystitis.  DG Chest Portable 1 View 08/21/2020 FINDINGS: The  heart size and mediastinal contours are within normal limits. Both lungs are clear. The visualized skeletal structures are unremarkable. IMPRESSION: No active disease.   VAS [place] UPPER EXTREMITY VENOUS DUPLEX 08/22/2020 Right: No evidence of deep vein thrombosis in the upper extremity. No evidence of superficial vein thrombosis in the upper extremity.   Left: No evidence of thrombosis in the subclavian.    Cardiac Studies:   Carotid artery duplex 16-Jul-2020: Right Carotid: The extracranial vessels were near-normal with only minimal wall thickening or plaque.  Left Carotid: The extracranial vessels were near-normal with only minimal  wall thickening or plaque.  Vertebrals:  Bilateral vertebral arteries demonstrate antegrade flow.  Subclavians: Normal flow hemodynamics were seen in bilateral subclavian arteries.Vertebrals:  Bilateral vertebral arteries demonstrate antegrade flow. Subclavians: Normal flow hemodynamics were seen in bilateral subclavian Arteries.   Echocardiogram 06/25/2020: 1. Left ventricular ejection fraction, by estimation, is 60 to 65%. The  left ventricle has normal function. The left ventricle has no regional  wall motion abnormalities. There is severe left ventricular hypertrophy.  Left ventricular diastolic parameters   are consistent with Grade I diastolic dysfunction (impaired relaxation).   2. Right ventricular systolic function is normal. The right ventricular  size is normal. There is moderately elevated pulmonary artery systolic  pressure. The estimated right ventricular systolic pressure is 09.8 mmHg.   3. Left atrial size was moderately dilated.   4. A small pericardial effusion is present. The pericardial effusion is  circumferential. There is no evidence of cardiac tamponade.   5. The mitral valve is normal in structure. Mild to moderate mitral valve  regurgitation. No evidence of mitral stenosis.   6. The aortic valve is calcified. There is moderate thickening of  the  aortic valve. Aortic valve regurgitation is mild. Mild aortic valve  stenosis. Aortic valve area, by VTI measures 1.26 cm. Aortic valve mean  gradient measures 14.0 mmHg. Aortic valve  Vmax measures 2.48 m/s.   7. The inferior vena cava is dilated in size with <50% respiratory  variability, suggesting right atrial pressure of 15 mmHg.   8. There is a small patent foramen ovale.    Stress Testing Lexiscan 06/27/2020: 1. No reversible ischemia. Probable inferior wall diaphragmatic attenuation.   2. Normal left ventricular wall motion.   3. Left ventricular ejection fraction 50%   4. Non invasive risk stratification*: Low   Left heart catheterization 06/30/2019 ([facility]):  Left main is patent.  LAD has mild diffuse irregularity.  Diagonal and septal branches are  patent.  The left circumflex is a dominant vessel and has mild diffuse  irregularity.  Obtuse marginal 1 is a small caliber branch and has  subtotal occlusion.  Second obtuse marginal and PLV branches are patent.  Right coronary artery is small and nondominant.  It is patent.  Left milligrams performed revealed preserved LV systolic function  left-ventricular ejection fraction 65%.   Plan: Medical therapy for very small caliber subtotal occlusion of OM1 branch.  Aspirin, statin and ACE inhibitor's.  Beta-blockers not used due to  history of cocaine use.  Aggressive control of his blood pressure and diabetes.  Rehab for cocaine use.  EKG: 08/21/2020: Sinus rhythm at a rate of 79 bpm.  Normal axis.  Nonspecific T wave abnormality.  08/07/2020: Sinus rhythm at a rate of 84 bpm.  Left atrial enlargement.  Left axis, left anterior fascicular block.  PRWP, cannot exclude anteroseptal infarct old.  Nonspecific T wave abnormality. Previous T wave inversions less prominent.   Telemetry: Sinus rhythm  Assessment   [name]  is a 60 y.o. African-American male with history of known CAD with subtotal occlusion of OM1  (cath 06/2019 at [facility]), hypertension, family history of premature CAD, hyperlipidemia, HFpEF, diabetes mellitus type 2, history of CVA with right-sided residual deficits, history of polysubstance abuse, obesity.  Now presents with acute on chronic HFpEF, acute kidney injury, and pericardial effusion.   Hypertensive urgency  Acute on chronic HFpEF Coronary artery disease Pericardial effusion AKI superimposed on CKD stage IIIa Essential hypertension History of CVA with residual right-sided weakness Type 2 diabetes mellitus, uncontrolled     Recommendations:   Hypertensive urgency Blood pressure was initially significantly elevated upon arrival.  Uncontrolled hypertension is likely driving patient's recurrent episodes of acute on chronic heart failure. Patient's blood pressure improved with administration of home antihypertensive medications. Will continue amlodipine 10 mg daily, carvedilol 12.5 mg daily, and spironolactone 25 mg daily. Will add BiDil 1 tablet p.o. 3 times daily We will also obtain renal artery duplex in order to evaluate for underlying etiology of uncontrolled hypertension.   Acute on chronic HFpEF Patient is significantly volume overloaded on exam and BNP is elevated.  Suspect mild troponin elevation is secondary to acute on chronic heart failure.  We will continue diuresis and monitor renal function closely given AKI.  Suspect renal function will improve with diuresis. Continue Lasix 40 mg IV twice daily. Strict I&O's, daily weights, sodium restriction. Agree with holding Entresto given current AKI.   We will continue other guideline directed medical therapy as follows: Continue carvedilol 12.5 mg twice daily Continue spironolactone 25 mg once daily  Coronary artery disease:  Subtotal occlusion of OM1 by cath 06/2019 at [facility]. Continue guideline directed medical therapy as follows: Continue aspirin 81 mg daily Continue atorvastatin 80 mg daily Continue carvedilol  12.5 mg twice daily Suspect patient chest pain to be secondary to acute heart failure rather than ACS given that troponins are only minimally elevated and flat.  We will continue to monitor closely.  Pericardial effusion No evidence of tamponade on exam.  Will obtain echocardiogram to further evaluate pericardial effusion.  We will continue to follow closely.  Recent echocardiogram on 06/25/2020 noted the pericardial effusion to be small.  AKI superimposed on CKD stage IIIa Suspect patient's renal function will improve with f further diuresis as above.  We will continue to monitor daily BMP. Hold Entresto at this time.  History of CVA with residual right-sided weakness Patient is presently at baseline.  No new neurological deficits.  Type 2 diabetes mellitus, uncontrolled Will defer further management to primary team.  However discussed with patient importance of improving glycemic control in order to reduce cardiovascular risk.  Appreciate you involving [place] in this patient's care, will continue to follow.  Patient was seen in collaboration with Dr. Virgina Jock. He also reviewed patient's chart and examined the patient. Dr. Virgina Jock is in agreement of the plan.    Alethia Berthold, PA-C 08/22/2020, 4:31 PM Office: [phone]

## 2020-08-22 NOTE — Assessment & Plan Note (Addendum)
--   Superimposed on CKD, stable, continue diuresis and follow closely.  Baseline creatinine around 1.5.

## 2020-08-22 NOTE — Progress Notes (Signed)
Echocardiogram 2D Echocardiogram limited with definity has been performed.  Darlina Sicilian M 08/22/2020, 3:02 PM

## 2020-08-22 NOTE — ED Notes (Signed)
ECHO at bedside.

## 2020-08-22 NOTE — Assessment & Plan Note (Signed)
--   Hemoglobin stable, check anemia panel.

## 2020-08-22 NOTE — H&P (Signed)
History and Physical    [name] AVW:[id number] DOB: Dec 04, 1960 DOA: 08/21/2020  PCP: [name], NP  Patient coming from: [facility]  I have personally briefly reviewed patient's old medical records in [place]  Chief Complaint: Edema, CP  HPI: [name] is a 60 y.o. male with medical history significant of HFpEF (grade 1 DD on echo last month), DM2, severe HTN prior stroke and R sided weakness.  Pt presents to ED with BLE edema, CP, sob, scrotal edema (anasarca).  CP onset today in anterior chest.  Pt with increased SOB from baseline.  Pt does have some chronic CP.  Pt has been regularly getting meds as documented by SNF.  Symptoms are persistent, severe, worsening.  No abd pain, no N/V/D.   ED Course: BNP 226.2  Trops 16 and 20.  Creat 2.0 up from 1.5 just 3 days ago.  Covid Neg  CXR neg.   Review of Systems: As per HPI, otherwise all review of systems negative.  Past Medical History:  Diagnosis Date   Arthritis    Bipolar affective ([place])    CHF (congestive heart failure) ([place])    Claustrophobia    Severe   Depression    Diabetes mellitus    uncontrolled    Heart murmur    Hypertension     Past Surgical History:  Procedure Laterality Date   BOWEL DECOMPRESSION N/A 01/19/2020   Procedure: BOWEL DECOMPRESSION;  Surgeon: Otis Brace, MD;  Location: [place];  Service: Gastroenterology;  Laterality: N/A;   COLONOSCOPY WITH PROPOFOL N/A 01/19/2020   Procedure: COLONOSCOPY WITH PROPOFOL;  Surgeon: Otis Brace, MD;  Location: [place];  Service: Gastroenterology;  Laterality: N/A;   KNEE SURGERY     b/l knees; pending total knee    RADIOLOGY WITH ANESTHESIA N/A 07/04/2020   Procedure: MRI WITH ANESTHESIA BRAIN WITHOUT CONTRAST AND HEAD WITHOUT CONTRAST;  Surgeon: Radiologist, Medication, MD;  Location: [place];  Service: Radiology;  Laterality: N/A;     reports that he has quit smoking. He has never used smokeless  tobacco. He reports previous drug use. Drug: Cocaine. He reports that he does not drink alcohol.  Allergies  Allergen Reactions   Other Anaphylaxis, Itching and Swelling    Allergy to GRITS , unknown exactly the specific ingredient he is allergic too    Family History  Problem Relation Age of Onset   Diabetes Other    Cancer Mother        uterus; deceased    Heart attack Mother 75   Hypertension Other    Heart attack Father 11   Stroke Sister        12/2012 age 87 y.o     Prior to Admission medications   Medication Sig Start Date End Date Taking? Authorizing Provider  amLODipine (NORVASC) 10 MG tablet Take 1 tablet (10 mg total) by mouth daily. 07/05/20 10/03/20 Yes [place] Eric J, DO  ammonium lactate (LAC-HYDRIN) 12 % lotion Apply 1 application topically 2 (two) times daily. Both legs 05/19/20  Yes [provider]  aspirin 81 MG chewable tablet Chew 1 tablet (81 mg total) by mouth daily. 07/06/20 10/04/20 Yes [place] Eric J, DO  atorvastatin (LIPITOR) 80 MG tablet Take 1 tablet (80 mg total) by mouth at bedtime. 07/05/20 10/03/20 Yes [place] Eric J, DO  carvedilol (COREG) 12.5 MG tablet Take 2 tablets (25 mg total) by mouth 2 (two) times daily with a meal. Patient taking differently: Take 12.5  mg by mouth 2 (two) times daily with a meal. 08/07/20 08/02/21 Yes Cantwell, Celeste C, PA-C  insulin aspart (NOVOLOG) 100 UNIT/ML injection Inject 12 Units into the skin 3 (three) times daily before meals. For blood sugars 0-150 give 0 units of insulin, 151-200 give 2 units of insulin, 201-250 give 4 units, 251-300 give 6 units, 301-350 give 8 units, 351-400 give 10 units,> 400 give 12 units and call M.D. Patient taking differently: Inject 2-12 Units into the skin See admin instructions. Check and record blood sugar before meals and inject per ssi. 151-200 = 2 units  201-250 = 4 units  251-300 = 6 units  301-350 = 8 units  351-400 = 10 units  greater than 400 = 12 units and call MD. 03/30/20   Yes [name], NP  Insulin Glargine (BASAGLAR KWIKPEN) 100 UNIT/ML Inject 30 Units into the skin at bedtime. 07/05/20 10/03/20 Yes [place] Eric J, DO  NON FORMULARY Take 237 mLs by mouth in the morning and at bedtime. ensure drink   Yes [provider]  pantoprazole (PROTONIX) 40 MG tablet Take 40 mg by mouth daily.   Yes [provider]  pregabalin (LYRICA) 75 MG capsule Take 1 capsule (75 mg total) by mouth 2 (two) times daily. 07/05/20 10/03/20 Yes [place] Eric J, DO  sacubitril-valsartan (ENTRESTO) 49-51 MG Take 1 tablet by mouth 2 (two) times daily. 07/05/20 10/03/20 Yes [place] Eric J, DO  sertraline (ZOLOFT) 50 MG tablet Take 50 mg by mouth daily.   Yes [provider]  tamsulosin (FLOMAX) 0.4 MG CAPS capsule Take 2 capsules (0.8 mg total) by mouth daily. 07/06/20 10/04/20 Yes [place] Eric J, DO  torsemide (DEMADEX) 20 MG tablet Take 2 tablets (40 mg total) by mouth daily. 08/16/20 09/15/20 Yes [place], Wylder S, PA  fluconazole (DIFLUCAN) 150 MG tablet Take 1 tablet (150 mg total) by mouth daily for 10 days. 08/18/20 08/28/20  Gareth Morgan, MD  ondansetron (ZOFRAN ODT) 4 MG disintegrating tablet 4mg  ODT q4 hours prn nausea/vomit Patient taking differently: Take 4 mg by mouth every 4 (four) hours as needed for nausea or vomiting. 07/11/20   Drenda Freeze, MD  polyethylene glycol (MIRALAX / GLYCOLAX) 17 g packet Take 17 g by mouth daily as needed. Patient taking differently: Take 17 g by mouth daily as needed for mild constipation. 03/30/20   [name], NP  spironolactone (ALDACTONE) 25 MG tablet Take 1 tablet (25 mg total) by mouth daily. 08/07/20   Alethia Berthold, PA-C    Physical Exam: Vitals:   08/22/20 0100 08/22/20 0130 08/22/20 0200 08/22/20 0300  BP: (!) 177/92 (!) 164/93 (!) 162/85 (!) 181/97  Pulse: 77 75 76 78  Resp: 16 14 12 11   Temp:      SpO2: 94% 97% 98% 98%  Weight:      Height:        Constitutional: NAD, calm,  comfortable Eyes: PERRL, lids and conjunctivae normal ENMT: Mucous membranes are moist. Posterior pharynx clear of any exudate or lesions.Normal dentition.  Neck: normal, supple, no masses, no thyromegaly Respiratory: clear to auscultation bilaterally, no wheezing, no crackles. Normal respiratory effort. No accessory muscle use.  Cardiovascular: Regular rate and rhythm, no murmurs / rubs / gallops. Pt with anasarca (4+ edema). 2+ pedal pulses. No carotid bruits.  Abdomen: no tenderness, no masses palpated. No hepatosplenomegaly. Bowel sounds positive.  Musculoskeletal: no clubbing / cyanosis. No joint deformity upper and lower extremities. Good ROM, no contractures. Normal  muscle tone.  Skin: no rashes, lesions, ulcers. No induration Neurologic: CN 2-12 grossly intact. Sensation intact, DTR normal. Strength 5/5 in all 4.  Psychiatric: Normal judgment and insight. Alert and oriented x 3. Normal mood.    Labs on Admission: I have personally reviewed following labs and imaging studies  CBC: Recent Labs  Lab 08/16/20 1025 08/18/20 0123 08/21/20 1824  WBC 4.0 4.1 3.4*  NEUTROABS 2.1 1.8 1.8  HGB 9.2* 9.8* 10.4*  HCT 30.5* 31.5* 33.5*  MCV 81.3 79.9* 79.8*  PLT 244 245 295   Basic Metabolic Panel: Recent Labs  Lab 08/16/20 1025 08/18/20 0123 08/21/20 1646  NA 134* 136 136  K 3.6 3.5 3.1*  CL 104 103 106  CO2 23 23 23   GLUCOSE 192* 147* 189*  BUN 30* 35* 34*  CREATININE 1.78* 1.51* 2.05*  CALCIUM 8.1* 8.6* 8.3*   GFR: Estimated Creatinine Clearance: 55.4 mL/min (A) (by C-G formula based on SCr of 2.05 mg/dL (H)). Liver Function Tests: Recent Labs  Lab 08/21/20 1646  AST 18  ALT 14  ALKPHOS 88  BILITOT 0.6  PROT 6.6  ALBUMIN 2.4*   No results for input(s): LIPASE, AMYLASE in the last 168 hours. No results for input(s): AMMONIA in the last 168 hours. Coagulation Profile: No results for input(s): INR, PROTIME in the last 168 hours. Cardiac Enzymes: No results for  input(s): CKTOTAL, CKMB, CKMBINDEX, TROPONINI in the last 168 hours. BNP (last 3 results) No results for input(s): PROBNP in the last 8760 hours. HbA1C: No results for input(s): HGBA1C in the last 72 hours. CBG: No results for input(s): GLUCAP in the last 168 hours. Lipid Profile: No results for input(s): CHOL, HDL, LDLCALC, TRIG, CHOLHDL, LDLDIRECT in the last 72 hours. Thyroid Function Tests: No results for input(s): TSH, T4TOTAL, FREET4, T3FREE, THYROIDAB in the last 72 hours. Anemia Panel: No results for input(s): VITAMINB12, FOLATE, FERRITIN, TIBC, IRON, RETICCTPCT in the last 72 hours. Urine analysis:    Component Value Date/Time   COLORURINE STRAW (A) 08/16/2020 1453   APPEARANCEUR CLEAR 08/16/2020 1453   APPEARANCEUR Clear 12/31/2013 2116   LABSPEC 1.008 08/16/2020 1453   LABSPEC 1.032 12/31/2013 2116   PHURINE 6.0 08/16/2020 1453   GLUCOSEU 150 (A) 08/16/2020 1453   GLUCOSEU >=500 12/31/2013 2116   HGBUR SMALL (A) 08/16/2020 1453   BILIRUBINUR NEGATIVE 08/16/2020 1453   BILIRUBINUR Negative 12/31/2013 2116   KETONESUR NEGATIVE 08/16/2020 1453   PROTEINUR 100 (A) 08/16/2020 1453   UROBILINOGEN 1.0 08/28/2013 1709   NITRITE NEGATIVE 08/16/2020 1453   LEUKOCYTESUR NEGATIVE 08/16/2020 1453   LEUKOCYTESUR Negative 12/31/2013 2116    Radiological Exams on Admission: DG Chest Portable 1 View  Result Date: 08/21/2020 CLINICAL DATA:  Central chest pressure. EXAM: PORTABLE CHEST 1 VIEW COMPARISON:  Chest x-ray dated August 17, 2020. FINDINGS: The heart size and mediastinal contours are within normal limits. Both lungs are clear. The visualized skeletal structures are unremarkable. IMPRESSION: No active disease. Electronically Signed   By: Titus Dubin M.D.   On: 08/21/2020 18:10    EKG: Independently reviewed.  Assessment/Plan Principal Problem:   Acute on chronic heart failure with preserved ejection fraction (HCC) Active Problems:   Essential hypertension   History of  noncompliance with medical treatment   Diabetes mellitus type 2, uncontrolled (HCC)   Chronic kidney disease, stage 4 (severe) (HCC)    Acute on chronic HFpEF - CHF pathway Lasix 40mg  IV BID for the moment Continue home aldactone Strict intake and output  Daily BMP Tele monitor HTN - Cont CCB, BB, diuretics Holding entresto due to renal fxn elevation for the moment. Adding PRN labetalol if needed DM2 - SSI sensitive scale AC CKD 4 - Creat 2.0 up from 1.5 a few days ago Close monitoring with diuresis  DVT prophylaxis: Lovenox Code Status: Full Family Communication: No family in room Disposition Plan: Back to SNF after diuresis Consults called: None Admission status: Place in obs    [place], [place] Hospitalists  How to contact the [facility] Attending or Consulting provider [place] or covering provider during after hours [place], for this patient?  Check the care team in [facility] and look for a) attending/consulting TRH provider listed and b) the [facility] team listed Log into [email or web address]  Amion Physician Scheduling and messaging for groups and whole hospitals  On call and physician scheduling software for group practices, residents, hospitalists and other medical providers for call, clinic, rotation and shift schedules. OnCall [employer] is a hospital-wide system for scheduling doctors and paging doctors on call. EasyPlot is for scientific plotting and data analysis.  [email or web address]  and use 's universal password to access. If you do not have the password, please contact the hospital operator.  Locate the [facility] provider you are looking for under [facility] and page to a number that you can be directly reached. If you still have difficulty reaching the provider, please page the [facility] (Director on Call) for the Hospitalists listed on amion for assistance.  08/22/2020, 3:56 AM

## 2020-08-22 NOTE — Assessment & Plan Note (Addendum)
--   With anasarca, massive volume overload in the setting of AKI superimposed on CKD.  Continue diuresis.  Involve cardiology.  Follow BMP.  Creatinine may improve with diuresis. -- Continue amlodipine, Lipitor, carvedilol, Aldactone

## 2020-08-22 NOTE — ED Notes (Signed)
Breakfast tray provided. 

## 2020-08-22 NOTE — Assessment & Plan Note (Signed)
--   Baseline GFR in the 40s to 50s.

## 2020-08-22 NOTE — Assessment & Plan Note (Addendum)
--   Supportive care.  Progressive mobility.  May benefit from PT consult with diuresis. -- Continue statin, blood pressure control

## 2020-08-22 NOTE — Plan of Care (Signed)
Patient booklet provided. Pt call bell and personal belongings placed within reach. Pt will call for assistance

## 2020-08-22 NOTE — Assessment & Plan Note (Signed)
--  dietician consult 

## 2020-08-22 NOTE — Assessment & Plan Note (Signed)
--   Stable.  Continue amlodipine, carvedilol, Lasix and spironolactone.

## 2020-08-22 NOTE — Progress Notes (Signed)
Right upper extremity venous duplex has been completed. Preliminary results can be found in CV Proc through chart review.   08/22/20 2:21 PM [name] RVT

## 2020-08-22 NOTE — ED Notes (Signed)
CBG- 175mg /dL

## 2020-08-22 NOTE — ED Provider Notes (Signed)
I called report to Dr Alcario Drought for admission    Ripley Fraise, MD 08/22/20 [phone]

## 2020-08-22 NOTE — ED Notes (Signed)
Lunch tray provided. 

## 2020-08-22 NOTE — Progress Notes (Signed)
PROGRESS NOTE  [name] [id number]:[id number] DOB: 16-Oct-1960 DOA: 08/21/2020 PCP: [name], NP  Brief History   60 year old man PMH including diastolic CHF, diabetes, morbid obesity, stroke presented with swelling, chest pain or shortness of breath.  Admitted for acute on chronic diastolic CHF.  Massive volume overload noted, cardiology consulted, continue diuresis, monitor renal function.  A & P  * Acute on chronic heart failure with preserved ejection fraction (HCC) -- With anasarca, massive volume overload in the setting of AKI superimposed on CKD.  Continue diuresis.  Involve cardiology.  Follow BMP.  Creatinine may improve with diuresis. -- Continue amlodipine, Lipitor, carvedilol, Aldactone  AKI (acute kidney injury) ([place]) -- Superimposed on CKD, stable, continue diuresis and follow closely.  Baseline creatinine around 1.5.  Pericardial effusion --moderate per CT 6/24. No s/s of tamponade. Will f/u cardiology opinion.  CKD stage IIIa (HCC) -- Baseline GFR in the 40s to 50s.  CVA [facility]) with residual right-sided weakness -- Supportive care.  Progressive mobility.  May benefit from PT consult with diuresis. -- Continue statin, blood pressure control  Diabetes mellitus type 2, uncontrolled (HCC) -- Last hemoglobin A1c 9.1 in May of this year. -- Currently stable, continue sliding scale insulin, Lantus  Diabetic neuropathy ([place]) -- Continue diabetic treatment.  Not having any particular medications for this.  Essential hypertension -- Stable.  Continue amlodipine, carvedilol, Lasix and spironolactone.  Morbid (severe) obesity due to excess calories [facility]) --dietician consult  Microcytic anemia -- Hemoglobin stable, check anemia panel.   Disposition Plan:  Discussion:   Dispo: The patient is from: ALF              Anticipated d/c is to: ALF              Patient currently is not medically stable to d/c.   Difficult to place patient No  DVT prophylaxis:  enoxaparin (LOVENOX) injection 40 mg Start: 08/22/20 1000   Code Status: Full Code Level of care: Telemetry Family Communication: none  Murray Hodgkins, MD  [facility] Direct contact: see [email or web address] (further directions at bottom of note if needed) 7PM-7AM contact night coverage as at bottom of note 08/22/2020, 11:56 AM  LOS: 0 days   Significant Hospital Events   6/28 admit CHF   Consults:  Cardiology    Procedures:  None   Interval History/Subjective  CC: f/u CHF  Feels SOB No n/v  ROS  Objective   Vitals:  Vitals:   08/22/20 1041 08/22/20 1053  BP: (!) 149/102 (!) 161/97  Pulse:  75  Resp:  10  Temp:    SpO2:  100%    Exam: Physical Exam Vitals and nursing note reviewed.  Constitutional:      General: He is not in acute distress.    Appearance: He is well-developed. He is obese.  Cardiovascular:     Rate and Rhythm: Normal rate and regular rhythm.     Heart sounds: No murmur heard. Pulmonary:     Effort: Pulmonary effort is normal. No respiratory distress.     Breath sounds: Normal breath sounds. No wheezing.  Abdominal:     Comments: obese  Musculoskeletal:        General: Swelling present.     Right lower leg: Edema present.     Left lower leg: Edema present.     Comments: Massive BLE edema, scrotal edema  Neurological:     Mental Status: He is alert.  Psychiatric:  Mood and Affect: Mood normal.        Behavior: Behavior normal.    I have personally reviewed the labs and other data, making special note of:   Today's Data  CBG stable Creatine 2.04 K+ 3 Hgb 10.4   Scheduled Meds:  amLODipine  10 mg Oral Daily   aspirin  81 mg Oral Daily   atorvastatin  80 mg Oral QHS   carvedilol  12.5 mg Oral BID WC   enoxaparin (LOVENOX) injection  40 mg Subcutaneous Q24H   furosemide  40 mg Intravenous BID   insulin aspart  0-9 Units Subcutaneous TID WC   insulin aspart  3 Units Subcutaneous TID WC   insulin glargine  20 Units  Subcutaneous QHS   pantoprazole  40 mg Oral Daily   pregabalin  75 mg Oral BID   sertraline  50 mg Oral Daily   sodium chloride flush  3 mL Intravenous Q12H   spironolactone  25 mg Oral Daily   tamsulosin  0.8 mg Oral Daily   Continuous Infusions:  sodium chloride      Principal Problem:   Acute on chronic heart failure with preserved ejection fraction (HCC) Active Problems:   AKI (acute kidney injury) ([place])   CKD stage IIIa (HCC)   Pericardial effusion   Essential hypertension   Diabetic neuropathy (HCC)   Diabetes mellitus type 2, uncontrolled ([place])   CVA ([place]) with residual right-sided weakness   Microcytic anemia   Morbid (severe) obesity due to excess calories ([place])   LOS: 0 days   How to contact the [facility] Attending or Consulting provider 7A - 7P or covering provider during after hours [place], for this patient?  Check the care team in [facility] and look for a) attending/consulting TRH provider listed and b) the [facility] team listed Log into [email or web address] and use [place]'s universal password to access. If you do not have the password, please contact the hospital operator. Locate the [facility] provider you are looking for under [facility] and page to a number that you can be directly reached. If you still have difficulty reaching the provider, please page the [facility] (Director on Call) for the Hospitalists listed on amion for assistance.

## 2020-08-23 ENCOUNTER — Inpatient Hospital Stay (HOSPITAL_COMMUNITY): Payer: Medicare HMO

## 2020-08-23 DIAGNOSIS — I5033 Acute on chronic diastolic (congestive) heart failure: Secondary | ICD-10-CM | POA: Diagnosis not present

## 2020-08-23 DIAGNOSIS — I10 Essential (primary) hypertension: Secondary | ICD-10-CM | POA: Diagnosis not present

## 2020-08-23 LAB — FERRITIN: Ferritin: 72 ng/mL (ref 24–336)

## 2020-08-23 LAB — BASIC METABOLIC PANEL
Anion gap: 8 (ref 5–15)
BUN: 31 mg/dL — ABNORMAL HIGH (ref 6–20)
CO2: 24 mmol/L (ref 22–32)
Calcium: 8.1 mg/dL — ABNORMAL LOW (ref 8.9–10.3)
Chloride: 107 mmol/L (ref 98–111)
Creatinine, Ser: 1.87 mg/dL — ABNORMAL HIGH (ref 0.61–1.24)
GFR, Estimated: 41 mL/min — ABNORMAL LOW (ref >60)
Glucose, Bld: 130 mg/dL — ABNORMAL HIGH (ref 70–99)
Potassium: 3 mmol/L — ABNORMAL LOW (ref 3.5–5.1)
Sodium: 139 mmol/L (ref 135–145)

## 2020-08-23 LAB — FOLATE: Folate: 7.2 ng/mL (ref >5.9)

## 2020-08-23 LAB — C DIFFICILE QUICK SCREEN W PCR REFLEX
C Diff antigen: NEGATIVE
C Diff interpretation: NOT DETECTED
C Diff toxin: NEGATIVE

## 2020-08-23 LAB — RETICULOCYTES
Immature Retic Fract: 17.6 % — ABNORMAL HIGH (ref 2.3–15.9)
RBC.: 3.55 MIL/uL — ABNORMAL LOW (ref 4.22–5.81)
Retic Count, Absolute: 62.1 10*3/uL (ref 19.0–186.0)
Retic Ct Pct: 1.8 % (ref 0.4–3.1)

## 2020-08-23 LAB — IRON AND TIBC
Iron: 30 ug/dL — ABNORMAL LOW (ref 45–182)
Saturation Ratios: 12 % — ABNORMAL LOW (ref 17.9–39.5)
TIBC: 246 ug/dL — ABNORMAL LOW (ref 250–450)
UIBC: 216 ug/dL

## 2020-08-23 LAB — GLUCOSE, CAPILLARY
Glucose-Capillary: 149 mg/dL — ABNORMAL HIGH (ref 70–99)
Glucose-Capillary: 132 mg/dL — ABNORMAL HIGH (ref 70–99)
Glucose-Capillary: 126 mg/dL — ABNORMAL HIGH (ref 70–99)
Glucose-Capillary: 124 mg/dL — ABNORMAL HIGH (ref 70–99)

## 2020-08-23 LAB — VITAMIN B12: Vitamin B-12: 239 pg/mL (ref 180–914)

## 2020-08-23 MED ORDER — POTASSIUM CHLORIDE CRYS ER 20 MEQ PO TBCR
40.0000 meq | EXTENDED_RELEASE_TABLET | ORAL | Status: AC
  Administered 2020-08-23 (×2): 40 meq via ORAL
  Filled 2020-08-23 (×2): qty 2

## 2020-08-23 NOTE — Progress Notes (Signed)
Patient has complained of abdominal pain/discomfort intermittently. Pt has experienced type 7 stool x3 today. Provider updated, Charge nurse updated. Will proceed with sample to lab.

## 2020-08-23 NOTE — Progress Notes (Signed)
LIMITED renal arterial duplex has been completed.  Results can be found under chart review under CV PROC. 08/23/2020 11:22 AM Elvyn Krohn RVT, RDMS

## 2020-08-23 NOTE — TOC Initial Note (Signed)
Transition of Care [facility]) - Initial/Assessment Note    Patient Details  Name: [name] MRN: [id number] Date of Birth: 07-06-1960  Transition of Care [facility]) CM/SW Contact:    Dessa Phi, RN Phone Number: 08/23/2020, 1:19 PM  Clinical Narrative:  patient d/c plan return back to [facility]. [facility]-confirmed from [facility] rep [place]-spoke to Angela-can return once medically stable. Will need covid prior d/c. Patient has Jazzy(electric w/c). Await PT eval prior fl2. Fax#[phone].               Expected Discharge Plan: Assisted Living Barriers to Discharge: Continued Medical Work up   Patient Goals and CMS Choice Patient states their goals for this hospitalization and ongoing recovery are:: return back to [facility] CMS Medicare.gov Compare Post Acute Care list provided to:: Patient Choice offered to / list presented to : Patient  Expected Discharge Plan and Services Expected Discharge Plan: Assisted Living   Discharge Planning Services: CM Consult Post Acute Care Choice: [place] arrangements for the past 2 months: [place]                                      Prior Living Arrangements/Services Living arrangements for the past 2 months: [place] Lives with:: Facility Resident Patient language and need for interpreter reviewed:: Yes Do you feel safe going back to the place where you live?: Yes      Need for Family Participation in Patient Care: No (Comment) Care giver support system in place?: Yes (comment)   Criminal Activity/Legal Involvement Pertinent to Current Situation/Hospitalization: No - Comment as needed  Activities of Daily Living Home Assistive Devices/Equipment: Cane (specify quad or straight), CBG Meter, Blood pressure cuff, Scales, Grab bars around toilet, Grab bars in shower, Hand-held shower hose ADL Screening (condition at time of admission) Patient's cognitive ability adequate to safely complete  daily activities?: Yes Is the patient deaf or have difficulty hearing?: No Does the patient have difficulty seeing, even when wearing glasses/contacts?: No Does the patient have difficulty concentrating, remembering, or making decisions?: No Patient able to express need for assistance with ADLs?: Yes Does the patient have difficulty dressing or bathing?: No Independently performs ADLs?: Yes (appropriate for developmental age) Does the patient have difficulty walking or climbing stairs?: Yes (secondary to leg swelling and pain) Weakness of Legs: Both Weakness of Arms/Hands: None  Permission Sought/Granted Permission sought to share information with : Case Manager Permission granted to share information with : Yes, Verbal Permission Granted  Share Information with NAME: Case Manager     Permission granted to share info w Relationship: [name] brother [phone]     Emotional Assessment Appearance:: Appears stated age Attitude/Demeanor/Rapport: Gracious Affect (typically observed): Accepting Orientation: : Oriented to Self, Oriented to Place, Oriented to  Time, Oriented to Situation Alcohol / Substance Use: Not Applicable Psych Involvement: No (comment)  Admission diagnosis:  Peripheral edema [R60.9] Acute on chronic diastolic CHF (congestive heart failure) (HCC) [I50.33] Acute on chronic congestive heart failure, unspecified heart failure type [facility]) [I50.9] Patient Active Problem List   Diagnosis Date Noted   AKI (acute kidney injury) ([place]) 08/22/2020   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 08/22/2020   Pericardial effusion 08/22/2020   Acute on chronic diastolic CHF (congestive heart failure) ([place]) 08/22/2020   TIA (transient ischemic attack) 07/03/2020   Acute on chronic heart failure with preserved  ejection fraction ([place])    Pulmonary artery hypertension ([place]) 06/25/2020   CKD (chronic kidney disease) 06/25/2020   History of cerebrovascular accident (CVA) with residual  deficit 06/25/2020   CKD stage IIIa ([place]) 03/30/2020   Morbid (severe) obesity due to excess calories ([place]) 03/30/2020   Malnutrition of moderate degree 01/25/2020   Palliative care by specialist    Goals of care, counseling/discussion    Nephrotic syndrome 01/06/2020   Anasarca 01/06/2020   Hypokalemia due to loss of potassium 01/04/2020   Generalized body aches 01/01/2020   Non-traumatic rhabdomyolysis 01/01/2020   Grade I diastolic dysfunction 83/38/2505   Microcytic anemia 01/01/2020   Hypokalemia 12/31/2019   Elevated CK    Chronic diastolic CHF (congestive heart failure) ([place]) 12/31/2017   Diabetes mellitus type 2, uncontrolled ([place])    Pure hypercholesterolemia    Hyperglycemia 09/28/2017   Nonobstructive atherosclerosis of coronary artery 09/28/2017   MDD (major depressive disorder), recurrent episode, severe ([place]) 07/30/2017   Osteoarthritis 04/12/2015   Severe recurrent major depression with psychotic features ([place]) 01/04/2015   Substance-related disorder ([place]) 10/30/2014   Patient's noncompliance with other medical treatment and regimen 06/07/2014   Cocaine dependence, uncomplicated ([place]) 39/76/7341   Chronic pain 05/31/2014   Cocaine abuse ([place]) 05/31/2014   History of noncompliance with medical treatment 05/31/2014   Malingering 05/31/2014   Leukopenia 05/31/2014   Diabetic neuropathy associated with diabetes mellitus due to underlying condition ([place]) 05/31/2014   Diabetic neuropathy ([place]) 05/31/2014   Congestive heart failure with left ventricular systolic dysfunction ([place]) 02/22/2014   Depression, major, recurrent, severe with psychosis ([place]) 08/29/2013   Suicidal ideations 08/28/2013   Uncontrolled diabetes mellitus ([place]) 06/30/2013   Atypical chest pain 12/09/2011   Essential hypertension 12/09/2011   Episodic mood disorder ([place]) 05/13/2011   PCP:  [name], NP Pharmacy:   [id number] [place], [place] [address] [place] [address] Phone: [phone] Fax: [phone]  [facility] - [place], [place] - [address] [place] [address] Phone: [phone] Fax: [phone]     Social Determinants of Health (SDOH) Interventions    Readmission Risk Interventions Readmission Risk Prevention Plan 01/12/2020  Transportation Screening Complete  Palliative Care Screening Not Applicable  Some recent data might be hidden

## 2020-08-23 NOTE — Progress Notes (Signed)
   08/23/20 1158  Vitals  Temp 97.6 F (36.4 C)  Temp Source Oral  BP 115/70  MAP (mmHg) 83  BP Location Left Arm  BP Method Automatic  Patient Position (if appropriate) Lying  Pulse Rate 75  Pulse Rate Source Monitor  Resp 16  MEWS COLOR  MEWS Score Color Green  Oxygen Therapy  SpO2 100 %  O2 Device Room Air  MEWS Score  MEWS Temp 0  MEWS Systolic 0  MEWS Pulse 0  MEWS RR 0  MEWS LOC 0  MEWS Score 0  Patient complains of blurred vision bilaterally. "I am seeing double". Pt denies dizziness, light headedness  or worsened weakness. Pt has some right sided weakness at baseline. Provider paged with update.

## 2020-08-23 NOTE — Progress Notes (Signed)
PROGRESS NOTE    [name]  XBJ:[id number] DOB: 08-20-1960 DOA: 08/21/2020 PCP: [name], NP     Brief Narrative:  [name] is a 60 year old man PMH including diastolic CHF, diabetes, morbid obesity, stroke presented with swelling, chest pain or shortness of breath.  Admitted for acute on chronic diastolic CHF.  Massive volume overload noted, cardiology consulted, continue diuresis, monitor renal function.   New events last 24 hours / Subjective: On examination this morning, patient lamented the fact that he has continuously been sent out of the hospital feeling the same way.  He tells me that he does not want to leave the hospital this time until he is improved.  After my initial rounding, patient had complained to the nurse that he was having blurry vision.  Patient was reevaluated.  He states that he thought this was hereditary, initially had told the nurse that this started half hour ago watching television, but to me, he stated that he started yesterday, and when probed further he states that it started maybe about a month ago and has been waxing and waning in nature.  States that most of his family have poor vision and blindness.  He denied any vision loss or eye pain.  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   Pericardial effusion   Acute on chronic diastolic CHF (congestive heart failure) (HCC)   Acute on chronic diastolic heart failure -Cardiology following -Continue Lasix, Coreg  AKI on CKD stage 3A -Baseline creatinine 1.5 -Improved   Pericardial effusion -No signs or symptoms of  tamponade  History of CVA with residual right-sided weakness -PT OT -Continue aspirin, Lipitor  Diabetes mellitus type 2, uncontrolled with hyperglycemia -Continue Lantus, NovoLog, sliding scale insulin  Essential hypertension -Continue amlodipine, Coreg, Lasix, spironolactone, BiDil -Renal artery duplex ordered, pending  Hypokalemia -Replace, trend  Depression -Continue Zoloft  BPH -Continue Flomax  DVT prophylaxis:  enoxaparin (LOVENOX) injection 40 mg Start: 08/22/20 1000  Code Status:     Code Status Orders  (From admission, onward)           Start     Ordered   08/22/20 0524  Full code  Continuous        08/22/20 0531           Code Status History     Date Active Date Inactive Code Status Order ID Comments User Context   06/30/2020 1243 07/05/2020 2345 Full Code [id number]  Geradine Girt, DO ED   06/25/2020 0944 06/28/2020 2024 Full Code [id number]  Norval Morton, MD ED   01/17/2020 1708 01/31/2020 1753 DNR [id number]  Rosezella Rumpf, NP Inpatient   12/31/2019 2311 01/17/2020 1708 Full Code [id number]  [name], MD ED   12/31/2017 1739 01/02/2018 1315 Full Code [id number]  [name], [name], MD Inpatient   09/28/2017 1434 10/01/2017 1948 Full Code [id number]  [name], [place], DO ED   07/30/2017 2111 08/04/2017 1746 Full Code [id number]  [name], NP Inpatient   07/29/2017 2347 07/30/2017 2033 Full Code [id number]  Recardo Evangelist, PA-C ED   01/08/2015  2013 01/09/2015 2017 Full Code [id number]  [name], MD Inpatient   01/04/2015 1915 01/08/2015 2013 Full Code [id number]  [name], MD Inpatient   12/01/2014 1735 12/02/2014 1242 Full Code [id number]  [name], MD ED   08/28/2013 1748 08/31/2013 1627 Full Code [id number]  [name], NP Inpatient   08/28/2013 1301 08/28/2013 1748 Full Code [id number]  [name], MD ED   05/10/2011 2141 05/12/2011 1747 Full Code [id number]  [name], PA-C ED      Family Communication: None at  bedside Disposition Plan:  Status is: Inpatient  Remains inpatient appropriate because:Inpatient level of care appropriate due to severity of illness  Dispo: The patient is from: ALF              Anticipated d/c is to: ALF              Patient currently is not medically stable to d/c.   Difficult to place patient No      Antimicrobials:  Anti-infectives (From admission, onward)    None        Objective: Vitals:   08/23/20 0423 08/23/20 0500 08/23/20 1043 08/23/20 1158  BP: (!) 144/69  (!) 149/90 115/70  Pulse: 77   75  Resp: 16   16  Temp: 98.3 F (36.8 C)   97.6 F (36.4 C)  TempSrc: Oral   Oral  SpO2: 97%   100%  Weight:  (!) 141.7 kg    Height:        Intake/Output Summary (Last 24 hours) at 08/23/2020 1343 Last data filed at 08/22/2020 2255 Gross per 24 hour  Intake --  Output 800 ml  Net -800 ml   Filed Weights   08/21/20 1631 08/22/20 1636 08/23/20 0500  Weight: (!) 149.7 kg (!) 139.3 kg (!) 141.7 kg    Examination:  General exam: Appears calm and comfortable  Respiratory system: Clear to auscultation. Respiratory effort normal. No respiratory distress. No conversational dyspnea.  On room air Cardiovascular system: S1 & S2 heard, RRR. No murmurs. + Bilateral pitting pedal edema. Gastrointestinal system: Abdomen is nondistended, soft and nontender. Normal bowel sounds heard. Central nervous system: Alert and oriented. No focal neurological deficits. Speech clear.  Extremities: Symmetric in appearance  Skin: No rashes, lesions or ulcers on exposed skin  Psychiatry: Mood & affect appropriate.   Data Reviewed: I have personally reviewed following labs and imaging studies  CBC: Recent Labs  Lab 08/18/20 0123 08/21/20 1824  WBC 4.1 3.4*  NEUTROABS 1.8 1.8  HGB 9.8* 10.4*  HCT 31.5* 33.5*  MCV 79.9* 79.8*  PLT 245 242   Basic Metabolic Panel: Recent Labs  Lab 08/18/20 0123 08/21/20 1646 08/22/20 0615 08/23/20 0441  NA 136 136 139 139  K  3.5 3.1* 3.0* 3.0*  CL 103 106 107 107  CO2 23 23 24 24   GLUCOSE 147* 189* 154* 130*  BUN 35* 34* 32* 31*  CREATININE 1.51* 2.05* 2.04* 1.87*  CALCIUM 8.6* 8.3* 8.2* 8.1*   GFR: Estimated Creatinine Clearance: 58.8 mL/min (A) (by C-G formula based on SCr of 1.87 mg/dL (H)). Liver Function Tests: Recent Labs  Lab 08/21/20 1646  AST 18  ALT 14  ALKPHOS 88  BILITOT 0.6  PROT 6.6  ALBUMIN 2.4*   No results for input(s): LIPASE, AMYLASE in the last 168 hours. No results for input(s): AMMONIA in the last 168 hours. Coagulation Profile: No results for input(s): INR, PROTIME in the last 168 hours. Cardiac  Enzymes: No results for input(s): CKTOTAL, CKMB, CKMBINDEX, TROPONINI in the last 168 hours. BNP (last 3 results) No results for input(s): PROBNP in the last 8760 hours. HbA1C: No results for input(s): HGBA1C in the last 72 hours. CBG: Recent Labs  Lab 08/22/20 1118 08/22/20 1717 08/22/20 2050 08/23/20 0712 08/23/20 1112  GLUCAP 175* 139* 162* 149* 132*   Lipid Profile: No results for input(s): CHOL, HDL, LDLCALC, TRIG, CHOLHDL, LDLDIRECT in the last 72 hours. Thyroid Function Tests: No results for input(s): TSH, T4TOTAL, FREET4, T3FREE, THYROIDAB in the last 72 hours. Anemia Panel: Recent Labs    08/23/20 0441  VITAMINB12 239  FOLATE 7.2  FERRITIN 72  TIBC 246*  IRON 30*  RETICCTPCT 1.8   Sepsis Labs: No results for input(s): PROCALCITON, LATICACIDVEN in the last 168 hours.  Recent Results (from the past 240 hour(s))  Resp Panel by RT-PCR (Flu A&B, Covid) Nasopharyngeal Swab     Status: None   Collection Time: 08/21/20 11:31 PM   Specimen: Nasopharyngeal Swab; Nasopharyngeal(NP) swabs in vial transport medium  Result Value Ref Range Status   SARS Coronavirus 2 by RT PCR NEGATIVE NEGATIVE Final    Comment: (NOTE) SARS-CoV-2 target nucleic acids are NOT DETECTED.  The SARS-CoV-2 RNA is generally detectable in upper respiratory specimens during the acute  phase of infection. The lowest concentration of SARS-CoV-2 viral copies this assay can detect is 138 copies/mL. A negative result does not preclude SARS-Cov-2 infection and should not be used as the sole basis for treatment or other patient management decisions. A negative result may occur with  improper specimen collection/handling, submission of specimen other than nasopharyngeal swab, presence of viral mutation(s) within the areas targeted by this assay, and inadequate number of viral copies(<138 copies/mL). A negative result must be combined with clinical observations, patient history, and epidemiological information. The expected result is Negative.  Fact Sheet for Patients:  EntrepreneurPulse.com.au  Fact Sheet for Healthcare Providers:  IncredibleEmployment.be  This test is no t yet approved or cleared by the [place] FDA and  has been authorized for detection and/or diagnosis of SARS-CoV-2 by FDA under an Emergency Use Authorization (EUA). This EUA will remain  in effect (meaning this test can be used) for the duration of the COVID-19 declaration under Section 564(b)(1) of the Act, 21 U.S.C.section 360bbb-3(b)(1), unless the authorization is terminated  or revoked sooner.       Influenza A by PCR NEGATIVE NEGATIVE Final   Influenza B by PCR NEGATIVE NEGATIVE Final    Comment: (NOTE) The Xpert Xpress SARS-CoV-2/FLU/RSV plus assay is intended as an aid in the diagnosis of influenza from Nasopharyngeal swab specimens and should not be used as a sole basis for treatment. Nasal washings and aspirates are unacceptable for Xpert Xpress SARS-CoV-2/FLU/RSV testing.  Fact Sheet for Patients: EntrepreneurPulse.com.au  Fact Sheet for Healthcare Providers: IncredibleEmployment.be  This test is not yet approved or cleared by the [place] FDA and has been authorized for detection and/or diagnosis of  SARS-CoV-2 by FDA under an Emergency Use Authorization (EUA). This EUA will remain in effect (meaning this test can be used) for the duration of the COVID-19 declaration under Section 564(b)(1) of the Act, 21 U.S.C. section 360bbb-3(b)(1), unless the authorization is terminated or revoked.  Performed at [facility], [place] [address], [place], [place] [address]   MRSA PCR Screening     Status: None   Collection Time: 08/22/20  5:39 PM  Result Value Ref Range Status   MRSA  by PCR NEGATIVE NEGATIVE Final    Comment:        The GeneXpert MRSA Assay (FDA approved for NASAL specimens only), is one component of a comprehensive MRSA colonization surveillance program. It is not intended to diagnose MRSA infection nor to guide or monitor treatment for MRSA infections. Performed at [facility], [place] [address] [place], [place] [address]       Radiology Studies: DG Chest Portable 1 View  Result Date: 08/21/2020 CLINICAL DATA:  Central chest pressure. EXAM: PORTABLE CHEST 1 VIEW COMPARISON:  Chest x-ray dated August 17, 2020. FINDINGS: The heart size and mediastinal contours are within normal limits. Both lungs are clear. The visualized skeletal structures are unremarkable. IMPRESSION: No active disease. Electronically Signed   By: Titus Dubin M.D.   On: 08/21/2020 18:10   VAS US RENAL ARTERY DUPLEX  Result Date: 08/23/2020 ABDOMINAL VISCERAL Patient Name:  [name]  Date of Exam:   08/23/2020 Medical Rec #: [id number]      Accession #:    [id number] Date of Birth: [birth year]       Patient Gender: M Patient Age:   60Y Exam Location:  [facility] Procedure:      VAS US RENAL ARTERY DUPLEX Referring Phys: [id number] [place] -------------------------------------------------------------------------------- Indications: Uncontrolled HTN Limitations: Obesity and Patient inability to cooperate with positioning or breathing. Comparison Study:  No previous exam Performing Technologist: Jody Hill RVT, RDMS  Examination Guidelines: A complete evaluation includes B-mode imaging, spectral Doppler, color Doppler, and power Doppler as needed of all accessible portions of each vessel. Bilateral testing is considered an integral part of a complete examination. Limited examinations for reoccurring indications may be performed as noted.  Duplex Findings: +----------------------+--------+--------+------+--------------+ Mesenteric            PSV cm/sEDV cm/sPlaque   Comments    +----------------------+--------+--------+------+--------------+ Aorta at SMA                                               +----------------------+--------+--------+------+--------------+ Aorta Prox               56      17                        +----------------------+--------+--------+------+--------------+ Celiac Artery Proximal                      Not visualized +----------------------+--------+--------+------+--------------+ SMA Proximal                                Not visualized +----------------------+--------+--------+------+--------------+    +------------------+--------+--------+--------------+ Right Renal ArteryPSV cm/sEDV cm/s   Comment     +------------------+--------+--------+--------------+ Origin                            Not visualized +------------------+--------+--------+--------------+ Proximal                          Not visualized +------------------+--------+--------+--------------+ Mid                               Not visualized +------------------+--------+--------+--------------+ Distal  Not visualized +------------------+--------+--------+--------------+ +-----------------+--------+--------+--------------+ Left Renal ArteryPSV cm/sEDV cm/s   Comment     +-----------------+--------+--------+--------------+ Origin                           Not visualized  +-----------------+--------+--------+--------------+ Proximal                         Not visualized +-----------------+--------+--------+--------------+ Mid                              Not visualized +-----------------+--------+--------+--------------+ Distal                           Not visualized +-----------------+--------+--------+--------------+  Technologist observations: SEVERELY limited exam due to combination of body habitus and patient inability to cooperate for exam. Patient stated he could not position onto his side. Patient could not take a deep breath and hold. +------------+--------+--------+----+-----------+--------+--------+---+ Right KidneyPSV cm/sEDV cm/sRI  Left KidneyPSV cm/sEDV cm/sRI  +------------+--------+--------+----+-----------+--------+--------+---+ Upper Pole                      Upper Pole                     +------------+--------+--------+----+-----------+--------+--------+---+ Mid         43      10      0.76Mid                            +------------+--------+--------+----+-----------+--------+--------+---+ Lower Pole                      Lower Pole                     +------------+--------+--------+----+-----------+--------+--------+---+ Hilar       23      6       0.74Hilar                          +------------+--------+--------+----+-----------+--------+--------+---+ +------------------+-----+------------------+--------------+ Right Kidney           Left Kidney                      +------------------+-----+------------------+--------------+ RAR                    RAR                              +------------------+-----+------------------+--------------+ RAR (manual)           RAR (manual)                     +------------------+-----+------------------+--------------+ Cortex            0.68 Cortex            Not visualized +------------------+-----+------------------+--------------+ Cortex  thickness       Corex thickness                  +------------------+-----+------------------+--------------+ Kidney length (cm)11.60Kidney length (cm)11.70          +------------------+-----+------------------+--------------+    Preliminary    VAS [place] UPPER EXTREMITY VENOUS DUPLEX  Result Date: 08/22/2020 UPPER VENOUS STUDY  Patient Name:  [name] [facility]  Date of Exam:   08/22/2020 Medical Rec #: [id number]      Accession #:    [id number] Date of Birth: [birth year]       Patient Gender: M Patient Age:   60Y Exam Location:  [facility] Procedure:      VAS [place] UPPER EXTREMITY VENOUS DUPLEX Referring Phys: [place] --------------------------------------------------------------------------------  Indications: Swelling Risk Factors: None identified. Limitations: Poor ultrasound/tissue interface and body habitus. Comparison Study: No prior studies. Performing Technologist: Oliver Hum RVT  Examination Guidelines: A complete evaluation includes B-mode imaging, spectral Doppler, color Doppler, and power Doppler as needed of all accessible portions of each vessel. Bilateral testing is considered an integral part of a complete examination. Limited examinations for reoccurring indications may be performed as noted.  Right Findings: +----------+------------+---------+-----------+----------+-------+ RIGHT     CompressiblePhasicitySpontaneousPropertiesSummary +----------+------------+---------+-----------+----------+-------+ IJV           Full       Yes       Yes                      +----------+------------+---------+-----------+----------+-------+ Subclavian    Full       Yes       Yes                      +----------+------------+---------+-----------+----------+-------+ Axillary      Full       Yes       Yes                      +----------+------------+---------+-----------+----------+-------+ Brachial      Full       Yes       Yes                       +----------+------------+---------+-----------+----------+-------+ Radial        Full                                          +----------+------------+---------+-----------+----------+-------+ Ulnar         Full                                          +----------+------------+---------+-----------+----------+-------+ Cephalic      Full                                          +----------+------------+---------+-----------+----------+-------+ Basilic       Full                                          +----------+------------+---------+-----------+----------+-------+  Left Findings: +----------+------------+---------+-----------+----------+-------+ LEFT      CompressiblePhasicitySpontaneousPropertiesSummary +----------+------------+---------+-----------+----------+-------+ Subclavian    Full       Yes       Yes                      +----------+------------+---------+-----------+----------+-------+  Summary:  Right: No evidence of deep vein thrombosis in the upper extremity. No evidence of superficial vein thrombosis in the upper extremity.  Left: No evidence of thrombosis in the subclavian.  *See table(s) above for measurements and observations.  Diagnosing physician: Deitra Mayo MD Electronically signed by Deitra Mayo MD on 08/22/2020 at 2:55:09 PM.    Final    ECHOCARDIOGRAM LIMITED  Result Date: 08/22/2020    ECHOCARDIOGRAM LIMITED REPORT   Patient Name:   [name] Date of Exam: 08/22/2020 Medical Rec #:  [id number]     Height:       68.0 in Accession #:    [id number]    Weight:       330.0 lb Date of Birth:  11/14/1960      BSA:          2.528 m Patient Age:    34 years      BP:           138/89 mmHg Patient Gender: M             HR:           72 bpm. Exam Location:  Inpatient Procedure: Limited Echo, Limited Color Doppler, Cardiac Doppler and Intracardiac            Opacification Agent Indications:    Pericardial effusion I31.3  History:         Patient has prior history of Echocardiogram examinations, most                 recent 06/25/2020. CHF, Stroke, Aortic Valve Disease,                 Signs/Symptoms:Chest Pain, Murmur and Shortness of Breath; Risk                 Factors:Hypertension and Diabetes. Lower extremity edema.  Sonographer:    Darlina Sicilian RDCS Referring Phys: [place]  Sonographer Comments: Restricted mobility. IMPRESSIONS  1. Left ventricular ejection fraction, by estimation, is 65 to 70%. The left ventricle has normal function. The left ventricle has no regional wall motion abnormalities. There is moderate concentric left ventricular hypertrophy. Left ventricular diastolic parameters are consistent with Grade II diastolic dysfunction (pseudonormalization). Elevated left atrial pressure.  2. Left atrial size was moderately dilated.  3. A small pericardial effusion is present. The pericardial effusion is mostly localized near the right ventricle. There is no evidence of cardiac tamponade.  4. Disproportionately severe right coronary cusp calcification is seen. The aortic valve is tricuspid. There is moderate calcification of the aortic valve. There is moderate thickening of the aortic valve. Aortic valve regurgitation is mild. Mild aortic  valve stenosis.  5. Tricuspid regurgitation signal is inadequate for assessing PA pressure.  6. The inferior vena cava is dilated in size with <50% respiratory variability, suggesting right atrial pressure of 15 mmHg. Comparison(s): Prior images reviewed side by side. The left ventricular function is unchanged. The left ventricular diastolic function is significantly worse. Filling pressures have increased. The pericardial effusion is unchanged or slightly smaller. FINDINGS  Left Ventricle: Left ventricular ejection fraction, by estimation, is 65 to 70%. The left ventricle has normal function. The left ventricle has no regional wall motion abnormalities. Definity contrast agent was given IV  to delineate the left ventricular  endocardial borders. The left ventricular internal cavity size was normal in size. There is moderate concentric left ventricular hypertrophy. Left ventricular diastolic parameters are consistent with Grade II diastolic dysfunction (pseudonormalization).  Elevated left atrial pressure. Right Ventricle: Tricuspid regurgitation signal is inadequate for assessing PA pressure. Left Atrium: Left atrial size  was moderately dilated. Right Atrium: Right atrial size was normal in size. Pericardium: A small pericardial effusion is present. The pericardial effusion is localized near the right ventricle. There is no evidence of cardiac tamponade. Aortic Valve: Disproportionately severe right coronary cusp calcification is seen. The aortic valve is tricuspid. There is moderate calcification of the aortic valve. There is moderate thickening of the aortic valve. Aortic valve regurgitation is mild. Mild aortic stenosis is present. Aortic valve mean gradient measures 9.0 mmHg. Aortic valve peak gradient measures 16.7 mmHg. Aorta: The aortic root and ascending aorta are structurally normal, with no evidence of dilitation. Venous: The inferior vena cava is dilated in size with less than 50% respiratory variability, suggesting right atrial pressure of 15 mmHg. LEFT VENTRICLE PLAX 2D LVIDd:         4.90 cm      Diastology LVIDs:         2.97 cm      LV e' medial:    5.46 cm/s LV PW:         1.50 cm      LV E/e' medial:  14.5 LV IVS:        1.38 cm      LV e' lateral:   5.86 cm/s                             LV E/e' lateral: 13.5  LV Volumes (MOD) LV vol d, MOD A2C: 92.2 ml LV vol d, MOD A4C: 174.0 ml LV vol s, MOD A2C: 48.3 ml  3D Volume EF: LV vol s, MOD A4C: 36.9 ml  3D EF:        68 % LV SV MOD A2C:     43.9 ml  LV EDV:       259 ml LV SV MOD A4C:     174.0 ml LV ESV:       82 ml LV SV MOD BP:      87.6 ml  LV SV:        177 ml LEFT ATRIUM         Index LA diam:    4.50 cm 1.78 cm/m  AORTIC VALVE AV  Vmax:           204.50 cm/s AV Vmean:          137.500 cm/s AV VTI:            0.393 m AV Peak Grad:      16.7 mmHg AV Mean Grad:      9.0 mmHg LVOT Vmax:         72.60 cm/s LVOT Vmean:        47.600 cm/s LVOT VTI:          0.163 m LVOT/AV VTI ratio: 0.41 MITRAL VALVE MV Area (PHT): 2.93 cm    SHUNTS MV Decel Time: 259 msec    Systemic VTI: 0.16 m MV E velocity: 79.00 cm/s MV A velocity: 83.60 cm/s MV E/A ratio:  0.94 Mihai Croitoru MD Electronically signed by Sanda Klein MD Signature Date/Time: 08/22/2020/5:00:35 PM    Final       Scheduled Meds:  amLODipine  10 mg Oral Daily   aspirin  81 mg Oral Daily   atorvastatin  80 mg Oral QHS   carvedilol  12.5 mg Oral BID WC   enoxaparin (LOVENOX) injection  40 mg Subcutaneous Q24H   furosemide  40 mg Intravenous BID  insulin aspart  0-9 Units Subcutaneous TID WC   insulin aspart  3 Units Subcutaneous TID WC   insulin glargine  20 Units Subcutaneous QHS   isosorbide-hydrALAZINE  1 tablet Oral TID   pantoprazole  40 mg Oral Daily   pregabalin  75 mg Oral BID   saccharomyces boulardii  250 mg Oral BID   sertraline  50 mg Oral Daily   sodium chloride flush  3 mL Intravenous Q12H   spironolactone  25 mg Oral Daily   tamsulosin  0.8 mg Oral Daily   Continuous Infusions:  sodium chloride       LOS: 1 day      Time spent: 40 minutes   Dessa Phi, DO [facility] 08/23/2020, 1:43 PM   Available via Epic secure chat 7am-7pm After these hours, please refer to coverage provider listed on [email or web address]

## 2020-08-24 DIAGNOSIS — I5033 Acute on chronic diastolic (congestive) heart failure: Secondary | ICD-10-CM | POA: Diagnosis not present

## 2020-08-24 LAB — GASTROINTESTINAL PANEL BY PCR, STOOL (REPLACES STOOL CULTURE)

## 2020-08-24 LAB — BASIC METABOLIC PANEL
Anion gap: 7 (ref 5–15)
BUN: 33 mg/dL — ABNORMAL HIGH (ref 6–20)
CO2: 24 mmol/L (ref 22–32)
Calcium: 8 mg/dL — ABNORMAL LOW (ref 8.9–10.3)
Chloride: 107 mmol/L (ref 98–111)
Creatinine, Ser: 2.13 mg/dL — ABNORMAL HIGH (ref 0.61–1.24)
GFR, Estimated: 35 mL/min — ABNORMAL LOW (ref >60)
Glucose, Bld: 146 mg/dL — ABNORMAL HIGH (ref 70–99)
Potassium: 3.2 mmol/L — ABNORMAL LOW (ref 3.5–5.1)
Sodium: 138 mmol/L (ref 135–145)

## 2020-08-24 LAB — GLUCOSE, CAPILLARY
Glucose-Capillary: 120 mg/dL — ABNORMAL HIGH (ref 70–99)
Glucose-Capillary: 125 mg/dL — ABNORMAL HIGH (ref 70–99)
Glucose-Capillary: 150 mg/dL — ABNORMAL HIGH (ref 70–99)
Glucose-Capillary: 142 mg/dL — ABNORMAL HIGH (ref 70–99)

## 2020-08-24 MED ORDER — METHOCARBAMOL 500 MG PO TABS
750.0000 mg | ORAL_TABLET | Freq: Once | ORAL | Status: AC
  Administered 2020-08-24: 750 mg via ORAL
  Filled 2020-08-24: qty 2

## 2020-08-24 MED ORDER — POTASSIUM CHLORIDE CRYS ER 20 MEQ PO TBCR
40.0000 meq | EXTENDED_RELEASE_TABLET | ORAL | Status: AC
  Administered 2020-08-24 (×2): 40 meq via ORAL
  Filled 2020-08-24 (×2): qty 2

## 2020-08-24 MED ORDER — LOPERAMIDE HCL 2 MG PO CAPS
2.0000 mg | ORAL_CAPSULE | ORAL | Status: DC | PRN
  Administered 2020-08-24 – 2020-08-29 (×3): 2 mg via ORAL
  Filled 2020-08-24 (×3): qty 1

## 2020-08-24 NOTE — Evaluation (Signed)
Physical Therapy Evaluation Patient Details Name: [name] MRN: [id number] DOB: [birth year] Today's Date: 08/24/2020   History of Present Illness  60 year old man admitted for acute on chronic diastolic CHF.  PMH includes CVA (residual R-side weakness), HTN, DM, CHF, arthritis, bipolar affective, depression.  Clinical Impression  Pt admitted with above diagnosis. Pt currently with functional limitations due to the deficits listed below (see PT Problem List). Pt will benefit from skilled PT to increase their independence and safety with mobility to allow discharge to the venue listed below.  Pt reports pain and edema in scrotum and LEs effecting his ability to move.  Pt required +4 assist for rolling.  Pt able to perform bed mobility and transfer upon last admission in May and states he was laterally scooting to power w/c at home ([facility]).  Pt states staff did sometimes even have to assist with lateral transfer.  Pt may need SNF upon d/c since he presents with decreased mobility from baseline.     Follow Up Recommendations SNF    Equipment Recommendations  None recommended by PT    Recommendations for Other Services       Precautions / Restrictions Precautions Precautions: Fall Precaution Comments: scrotal and LE edema painful per pt      Mobility  Bed Mobility Overal bed mobility: Needs Assistance Bed Mobility: Rolling Rolling: +2 for physical assistance;Total assist         General bed mobility comments: +4 to roll bilaterally due to pain and lower body edema, pt reports constant pain in sidelying; RN and techs in to assist with pericare and changing linen (pt with loose BM)    Transfers                    Ambulation/Gait                Stairs            Wheelchair Mobility    Modified Rankin (Stroke Patients Only)       Balance                                             Pertinent Vitals/Pain Pain Assessment:  Faces Faces Pain Scale: Hurts worst Pain Location: scrotum and bil LEs Pain Descriptors / Indicators: Discomfort;Guarding;Tightness Pain Intervention(s): Repositioned;Monitored during session    Home Living Family/patient expects to be discharged to:: Assisted living               Home Equipment: Walker - 4 wheels;Cane - single point;Wheelchair - power;Shower seat;Walker - 2 wheels Additional Comments: Resides at [facility]. Accessible room with walk-in shower    Prior Function Level of Independence: Needs assistance   Gait / Transfers Assistance Needed: reports he transfers to/from power w/c by lateral scoot and sometimes requires assist from staff  ADL's / Homemaking Assistance Needed: needs assist from staff for bathing; able to transfer himself from w/c to shower bench        Hand Dominance        Extremity/Trunk Assessment        Lower Extremity Assessment Lower Extremity Assessment: RLE deficits/detail;LLE deficits/detail RLE Deficits / Details: residual defictis from previous CVA, observed grossly 2+/5 LLE Deficits / Details: observed grossly 2+/5, pt reports pain limiting and unable to assist with active full motion due to edema and pain  Communication   Communication: No difficulties  Cognition Arousal/Alertness: Awake/alert Behavior During Therapy: Anxious Overall Cognitive Status: Within Functional Limits for tasks assessed                                 General Comments: anxious about movement due to pain from edema, appears to have poor insight into medical condition      General Comments      Exercises     Assessment/Plan    PT Assessment Patient needs continued PT services  PT Problem List Decreased strength;Decreased mobility;Decreased activity tolerance;Decreased balance;Decreased knowledge of use of DME;Obesity       PT Treatment Interventions DME instruction;Gait training;Therapeutic exercise;Balance  training;Wheelchair mobility training;Functional mobility training;Patient/family education;Therapeutic activities    PT Goals (Current goals can be found in the Care Plan section)  Acute Rehab PT Goals PT Goal Formulation: With patient Time For Goal Achievement: 09/01/20 Potential to Achieve Goals: Fair    Frequency Min 2X/week   Barriers to discharge        Co-evaluation               AM-PAC PT "6 Clicks" Mobility  Outcome Measure Help needed turning from your back to your side while in a flat bed without using bedrails?: Total Help needed moving from lying on your back to sitting on the side of a flat bed without using bedrails?: Total Help needed moving to and from a bed to a chair (including a wheelchair)?: Total Help needed standing up from a chair using your arms (e.g., wheelchair or bedside chair)?: Total Help needed to walk in hospital room?: Total Help needed climbing 3-5 steps with a railing? : Total 6 Click Score: 6    End of Session   Activity Tolerance: Patient limited by pain Patient left: in bed;with call bell/phone within reach;with bed alarm set Nurse Communication: Mobility status PT Visit Diagnosis: Muscle weakness (generalized) (M62.81);Other abnormalities of gait and mobility (R26.89)    Time: 4503-8882 PT Time Calculation (min) (ACUTE ONLY): 35 min   Charges:   PT Evaluation $PT Eval Low Complexity: 1 Low PT Treatments $Therapeutic Activity: 8-22 mins       Jannette Spanner PT, DPT Acute Rehabilitation Services Pager: [phone] Office: [phone]   Lexany Belknap,KATHrine E 08/24/2020, 11:45 AM

## 2020-08-24 NOTE — Consult Note (Addendum)
[facility] Inpatient Consult   08/24/2020  RAQUON MILLEDGE 22-Dec-1960 [id number]  [place] Organization [ACO] Patient: [employer]   Patient screened for hospitalization with noted extreme extreme risk score for unplanned readmission, 69%, assessing for potential [place] Management service needs for post hospital transition. Per review, patient admitted from ALF and current disposition plan is to return to [facility].   Patient is listed as active with [facility] RN care coordinator. Will update care coordinator of patient admission and continue to follow for progress and disposition.  Of note, [facility] Care Management services does not replace or interfere with any services that are arranged by inpatient case management or social work.    Netta Cedars, MSN, [facility] Liaison Nurse Mobile Phone [phone]  Toll free office [phone]

## 2020-08-24 NOTE — NC FL2 (Signed)
[place] LEVEL OF CARE SCREENING TOOL     IDENTIFICATION  Patient Name: [name] Birthdate: [birth year] Sex: male Admission Date (Current Location): 08/21/2020  [place] and [place] Number:  Kathleen Argue [id number] [place] and Address:  [facility],  [address], [place]      Provider Number: [id number]  Attending Physician Name and Address:  Dessa Phi, DO  Relative Name and Phone Number:  Rahmir, Beever   [phone]    Current Level of Care: Hospital Recommended Level of Care: [place] Prior Approval Number:    Date Approved/Denied:   PASRR Number: [id number]  Discharge Plan: SNF    Current Diagnoses: Patient Active Problem List   Diagnosis Date Noted   AKI (acute kidney injury) ([place]) 08/22/2020   CVA ([place]) with residual right-sided weakness 08/22/2020   Pericardial effusion 08/22/2020   Acute on chronic diastolic CHF (congestive heart failure) ([place]) 08/22/2020   TIA (transient ischemic attack) 07/03/2020   Acute on chronic heart failure with preserved ejection fraction [facility])    Pulmonary artery hypertension ([place]) 06/25/2020   CKD (chronic kidney disease) 06/25/2020   History of cerebrovascular accident (CVA) with residual deficit 06/25/2020   CKD stage IIIa ([place]) 03/30/2020   Morbid (severe) obesity due to excess calories ([place]) 03/30/2020   Malnutrition of moderate degree 01/25/2020   Palliative care by specialist    Goals of care, counseling/discussion    Nephrotic syndrome 01/06/2020   Anasarca 01/06/2020   Hypokalemia due to loss of potassium 01/04/2020   Generalized body aches 01/01/2020   Non-traumatic rhabdomyolysis 01/01/2020   Grade I diastolic dysfunction 49/17/9150   Microcytic anemia 01/01/2020   Hypokalemia 12/31/2019   Elevated CK    Chronic diastolic CHF (congestive heart failure) ([place]) 12/31/2017   Diabetes mellitus type 2, uncontrolled ([place])    Pure hypercholesterolemia     Hyperglycemia 09/28/2017   Nonobstructive atherosclerosis of coronary artery 09/28/2017   MDD (major depressive disorder), recurrent episode, severe ([place]) 07/30/2017   Osteoarthritis 04/12/2015   Severe recurrent major depression with psychotic features ([place]) 01/04/2015   Substance-related disorder ([place]) 10/30/2014   Patient's noncompliance with other medical treatment and regimen 06/07/2014   Cocaine dependence, uncomplicated ([place]) 56/97/9480   Chronic pain 05/31/2014   Cocaine abuse ([place]) 05/31/2014   History of noncompliance with medical treatment 05/31/2014   Malingering 05/31/2014   Leukopenia 05/31/2014   Diabetic neuropathy associated with diabetes mellitus due to underlying condition ([place]) 05/31/2014   Diabetic neuropathy ([place]) 05/31/2014   Congestive heart failure with left ventricular systolic dysfunction ([place]) 02/22/2014   Depression, major, recurrent, severe with psychosis ([place]) 08/29/2013   Suicidal ideations 08/28/2013   Uncontrolled diabetes mellitus ([place]) 06/30/2013   Atypical chest pain 12/09/2011   Essential hypertension 12/09/2011   Episodic mood disorder ([place]) 05/13/2011    Orientation RESPIRATION BLADDER Height & Weight     Self, Time, Situation, Place  Normal Incontinent Weight: (!) 312 lb 6.3 oz (141.7 kg) Height:  5\' 8"  (172.7 cm)  BEHAVIORAL SYMPTOMS/MOOD NEUROLOGICAL BOWEL NUTRITION STATUS      Incontinent Diet  AMBULATORY STATUS COMMUNICATION OF NEEDS Skin   Limited Assist Verbally Normal                       Personal Care Assistance Level of Assistance  Bathing, Dressing, Feeding Bathing Assistance: Limited assistance Feeding assistance: Independent Dressing Assistance: Limited assistance     Functional Limitations Info  Sight, Hearing, Speech Sight Info:  Adequate Hearing Info: Adequate Speech Info: Adequate    SPECIAL CARE FACTORS FREQUENCY  PT (By licensed PT), OT (By licensed OT)     PT Frequency: Minimum 5x a week OT  Frequency: Minimum 5x a week            Contractures Contractures Info: Not present    Additional Factors Info  Code Status, Insulin Sliding Scale, Psychotropic Code Status Info: Full Code   Psychotropic Info: sertraline (ZOLOFT) tablet 50 mg Insulin Sliding Scale Info: insulin aspart (novoLOG) injection 0-9 Units 3x a day with meals.       Current Medications (08/24/2020):  This is the current hospital active medication list Current Facility-Administered Medications  Medication Dose Route Frequency Provider Last Rate Last Admin   0.9 %  sodium chloride infusion  250 mL Intravenous PRN Etta Quill, DO       acetaminophen (TYLENOL) tablet 650 mg  650 mg Oral Q4H PRN Etta Quill, DO   650 mg at 08/24/20 1226   amLODipine (NORVASC) tablet 10 mg  10 mg Oral Daily Jennette Kettle M, DO   10 mg at 08/24/20 1100   aspirin chewable tablet 81 mg  81 mg Oral Daily Jennette Kettle M, DO   81 mg at 08/24/20 1100   atorvastatin (LIPITOR) tablet 80 mg  80 mg Oral QHS Etta Quill, DO   80 mg at 08/23/20 2137   carvedilol (COREG) tablet 12.5 mg  12.5 mg Oral BID WC Jennette Kettle M, DO   12.5 mg at 08/24/20 0900   enoxaparin (LOVENOX) injection 40 mg  40 mg Subcutaneous Q24H Jennette Kettle M, DO   40 mg at 08/24/20 1100   furosemide (LASIX) injection 40 mg  40 mg Intravenous BID Jennette Kettle M, DO   40 mg at 08/24/20 0900   insulin aspart (novoLOG) injection 0-9 Units  0-9 Units Subcutaneous TID WC Etta Quill, DO   1 Units at 08/24/20 1230   insulin aspart (novoLOG) injection 3 Units  3 Units Subcutaneous TID WC Etta Quill, DO   3 Units at 08/24/20 1230   insulin glargine (LANTUS) injection 20 Units  20 Units Subcutaneous QHS Etta Quill, DO   20 Units at 08/23/20 2137   isosorbide-hydrALAZINE (BIDIL) 20-37.5 MG per tablet 1 tablet  1 tablet Oral TID Cantwell, Celeste C, PA-C   1 tablet at 08/24/20 1100   labetalol (NORMODYNE) injection 10-20 mg  10-20 mg Intravenous  Q2H PRN Etta Quill, DO   10 mg at 08/22/20 0439   ondansetron (ZOFRAN) injection 4 mg  4 mg Intravenous Q6H PRN Etta Quill, DO       pantoprazole (PROTONIX) EC tablet 40 mg  40 mg Oral Daily Jennette Kettle M, DO   40 mg at 08/24/20 1100   polyethylene glycol (MIRALAX / GLYCOLAX) packet 17 g  17 g Oral Daily PRN Etta Quill, DO       potassium chloride SA (KLOR-CON) CR tablet 40 mEq  40 mEq Oral Q4H Dessa Phi, DO       pregabalin (LYRICA) capsule 75 mg  75 mg Oral BID Jennette Kettle M, DO   75 mg at 08/24/20 1100   saccharomyces boulardii (FLORASTOR) capsule 250 mg  250 mg Oral BID Kathryne Eriksson, NP   250 mg at 08/24/20 1100   sertraline (ZOLOFT) tablet 50 mg  50 mg Oral Daily Jennette Kettle M, DO   50 mg at 08/24/20 1100  sodium chloride flush (NS) 0.9 % injection 3 mL  3 mL Intravenous Q12H Jennette Kettle M, DO   3 mL at 08/23/20 2139   sodium chloride flush (NS) 0.9 % injection 3 mL  3 mL Intravenous PRN Etta Quill, DO       tamsulosin [facility]) capsule 0.8 mg  0.8 mg Oral Daily Alcario Drought, Jared M, DO   0.8 mg at 08/24/20 1100     Discharge Medications: Please see discharge summary for a list of discharge medications.  Relevant Imaging Results:  Relevant Lab Results:   Additional Information SSN [id number]  Ross Ludwig, LCSW

## 2020-08-24 NOTE — TOC Progression Note (Signed)
Transition of Care [facility]) - Progression Note    Patient Details  Name: [name] MRN: [id number] Date of Birth: 1960-03-02  Transition of [facility]) CM/SW Contact  Ross Ludwig, [place] Phone Number: 08/24/2020, 3:41 PM  Clinical Narrative:     CSW spoke to patient and discussed PT recommendation for short term rehab.  Per patient he has been to rehab in the past, and he gave permission for CSW to begin bed search in [facility].  CSW also spoke to [place], patient normally is able to complete all his ADLs independently, transfer himself from bed to electric wheelchair, use the bathroom on his own, and is normally alert and oriented x4.  Patient does not use oxygen normally.  CSW to begin bed search in [facility].   Expected Discharge Plan: Assisted Living Barriers to Discharge: Continued Medical Work up  Expected Discharge Plan and Services Expected Discharge Plan: Assisted Living   Discharge Planning Services: CM Consult Post Acute Care Choice: [place] arrangements for the past 2 months: [place]                                       Social Determinants of Health (SDOH) Interventions    Readmission Risk Interventions Readmission Risk Prevention Plan 01/12/2020  Transportation Screening Complete  Palliative Care Screening Not Applicable  Some recent data might be hidden

## 2020-08-24 NOTE — Progress Notes (Signed)
Subjective:  He continues to feel poorly.  Now, he complains of diarrhea, heaviness in his legs, and chronic pain.Marland Kitchen  He is not on any oxygen.    Labs were pending this morning when I saw the patient, resulted now.  Details below.  Ins and outs are not accurately measured.  Objective:  Vital Signs in the last 24 hours: Temp:  [97.8 F (36.6 C)-98.2 F (36.8 C)] 98.2 F (36.8 C) (06/30 1230) Pulse Rate:  [75-79] 79 (06/30 1230) Resp:  [16-20] 17 (06/30 1230) BP: (137-141)/(79-84) 141/82 (06/30 1230) SpO2:  [98 %-99 %] 99 % (06/30 1230)  Intake/Output from previous day: 06/29 0701 - 06/30 0700 In: 600 [P.O.:600] Out: 500 [Urine:500]  Physical Exam Vitals and nursing note reviewed.  Constitutional:      General: He is not in acute distress.    Appearance: He is well-developed.  HENT:     Head: Normocephalic and atraumatic.  Eyes:     Conjunctiva/sclera: Conjunctivae normal.     Pupils: Pupils are equal, round, and reactive to light.  Neck:     Vascular: No JVD.  Cardiovascular:     Rate and Rhythm: Normal rate and regular rhythm.     Pulses: Intact distal pulses.     Heart sounds: No murmur heard. Pulmonary:     Effort: Pulmonary effort is normal.     Breath sounds: Normal breath sounds. No wheezing or rales.  Abdominal:     General: Bowel sounds are normal.     Palpations: Abdomen is soft.     Tenderness: There is no rebound.  Musculoskeletal:        General: No tenderness. Normal range of motion.     Right lower leg: Edema (1+) present.     Left lower leg: Edema (1+) present.  Lymphadenopathy:     Cervical: No cervical adenopathy.  Skin:    General: Skin is warm and dry.     Comments: Poor toenail hygiene  Neurological:     Mental Status: He is alert and oriented to person, place, and time.     Cranial Nerves: No cranial nerve deficit.     Lab Results: BMP Recent Labs    08/22/20 0615 08/23/20 0441 08/24/20 1308  NA 139 139 138  K 3.0* 3.0* 3.2*  CL  107 107 107  CO2 24 24 24   GLUCOSE 154* 130* 146*  BUN 32* 31* 33*  CREATININE 2.04* 1.87* 2.13*  CALCIUM 8.2* 8.1* 8.0*  GFRNONAA 37* 41* 35*    CBC Recent Labs  Lab 08/21/20 1824 08/23/20 0441  WBC 3.4*  --   RBC 4.20* 3.55*  HGB 10.4*  --   HCT 33.5*  --   PLT 252  --   MCV 79.8*  --   MCH 24.8*  --   MCHC 31.0  --   RDW 15.0  --   LYMPHSABS 1.0  --   MONOABS 0.5  --   EOSABS 0.2  --   BASOSABS 0.0  --     HEMOGLOBIN A1C Lab Results  Component Value Date   HGBA1C 9.1 (H) 06/25/2020   MPG 214.47 06/25/2020    Cardiac Panel (last 3 results) Recent Labs    12/31/19 2122 01/02/20 0750  CKTOTAL 970* 568*    BNP (last 3 results) Recent Labs    08/16/20 1025 08/18/20 0123 08/21/20 1801  BNP 245.3* 139.2* 226.2*    TSH Recent Labs    01/01/20 0849 01/18/20 0517 06/25/20 1244  TSH 1.354 2.317 1.714    Lipid Panel     Component Value Date/Time   CHOL 305 (H) 06/25/2020 0711   TRIG 90 06/25/2020 0711   HDL 63 06/25/2020 0711   CHOLHDL 4.8 06/25/2020 0711   VLDL 18 06/25/2020 0711   LDLCALC 224 (H) 06/25/2020 0711     Hepatic Function Panel Recent Labs    01/09/20 1027 01/14/20 0507 06/27/20 0301 06/30/20 0039 07/11/20 1849 08/05/20 1932 08/21/20 1646  PROT 6.9   < > 5.3*   < > 6.6 7.0 6.6  ALBUMIN 2.9*   < > 1.6*   < > 2.4* 2.5* 2.4*  AST 49*   < > 13*   < > 14* 17 18  ALT 35   < > 11   < > 13 15 14   ALKPHOS 94   < > 81   < > 74 97 88  BILITOT 0.6   < > <0.1*   < > 0.4 0.5 0.6  BILIDIR <0.1  --  <0.1  --   --   --   --   IBILI NOT CALCULATED  --  NOT CALCULATED  --   --   --   --    < > = values in this interval not displayed.    Cardiac Studies:  EKG 08/21/2020: Sinus rhythm Anteroseptal infarct, old Nonspecific T abnormalities, lateral leads  Echocardiogram 08/24/2020:  1. Left ventricular ejection fraction, by estimation, is 65 to 70%. The  left ventricle has normal function. The left ventricle has no regional  wall  motion abnormalities. There is moderate concentric left ventricular  hypertrophy. Left ventricular  diastolic parameters are consistent with Grade II diastolic dysfunction  (pseudonormalization). Elevated left atrial pressure.   2. Left atrial size was moderately dilated.   3. A small pericardial effusion is present. The pericardial effusion is  mostly localized near the right ventricle. There is no evidence of cardiac  tamponade.   4. Disproportionately severe right coronary cusp calcification is seen.  The aortic valve is tricuspid. There is moderate calcification of the  aortic valve. There is moderate thickening of the aortic valve. Aortic  valve regurgitation is mild. Mild aortic   valve stenosis.   5. Tricuspid regurgitation signal is inadequate for assessing PA  pressure.   6. The inferior vena cava is dilated in size with <50% respiratory  variability, suggesting right atrial pressure of 15 mmHg.   Assessment & Recommendations:  60 year old African-American male with  uncontrolled hypertension, type 2 diabetes mellitus, h/o stroke, CAD, HFpEF  HFpEF: Acute on chronic.  Primarily triggered by hypertensive urgency and poor dietary compliance. He is close to being euvolemic at this time.  Blood pressure is better controlled with the addition of Bidil. Given is tenuous renal function, I will discontinue spironolactone.  Continue Lasix IV 40 mg daily, which can be switched to oral 40 mg daily. I don't think he is going to tolerate ACEi/ARB/MRA in 6 months, owing to his renal function. Strongly encourage increased mobility, and physical therapy, if possible.,  Hypertension: Inadequate renal artery duplex study.   Blood pressure is better controlled.   AKI/CKD:  Likely cardiorenal syndrome, along with intrinsic renal disease. Discontinued spironolactone.  We will peripherally follow the patient   Nigel Mormon, MD Pager: [phone] Office: [phone]

## 2020-08-24 NOTE — Progress Notes (Signed)
PROGRESS NOTE    [name]  CBJ:[id number] DOB: 1960-08-20 DOA: 08/21/2020 PCP: [name], NP     Brief Narrative:  [name] is a 60 year old man PMH including diastolic CHF, diabetes, morbid obesity, stroke presented with swelling, chest pain or shortness of breath.  Admitted for acute on chronic diastolic CHF.  Massive volume overload noted, cardiology consulted, continue diuresis, monitor renal function.   New events last 24 hours / Subjective: Patient sleeping when I entered the room.  When awoken, he admitted to feeling short of breath.  He remained on room air.  No conversational dyspnea noted.  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   Pericardial effusion   Acute on chronic diastolic CHF (congestive heart failure) ([place])   Acute on chronic diastolic heart failure -Cardiology following -Continue Lasix, Coreg -Continue fluid restriction, daily weight and strict I's and O's  AKI on CKD stage 3A -Baseline creatinine 1.5 -Improved, morning labs pending  Pericardial effusion -No signs or symptoms of tamponade  History of CVA with residual right-sided weakness -PT OT recommending SNF placement -Continue aspirin, Lipitor  Diabetes mellitus type 2, uncontrolled with hyperglycemia -Continue Lantus, NovoLog, sliding scale insulin  Essential hypertension -Continue amlodipine, Coreg, Lasix, spironolactone, BiDil -Renal artery duplex unfortunately limited due to body habitus  Depression -Continue Zoloft  BPH -Continue Flomax  Microcytic  anemia -Iron studies consistent with anemia of chronic disease  Diarrhea -C. difficile negative, GI PCR pending  DVT prophylaxis:  enoxaparin (LOVENOX) injection 40 mg Start: 08/22/20 1000  Code Status:     Code Status Orders  (From admission, onward)           Start     Ordered   08/22/20 0524  Full code  Continuous        08/22/20 0531           Code Status History     Date Active Date Inactive Code Status Order ID Comments User Context   06/30/2020 1243 07/05/2020 2345 Full Code [id number]  Geradine Girt, DO ED   06/25/2020 0944 06/28/2020 2024 Full Code [id number]  Norval Morton, MD ED   01/17/2020 1708 01/31/2020 1753 DNR [id number]  Rosezella Rumpf, NP Inpatient   12/31/2019 2311 01/17/2020 1708 Full Code [id number]  [name], MD ED   12/31/2017 1739 01/02/2018 1315 Full Code [id number]  [name], [name], MD Inpatient   09/28/2017 1434 10/01/2017 1948 Full Code [id number]  [name], [place], DO ED   07/30/2017 2111 08/04/2017 1746 Full Code [id number]  Rankin, [name], NP Inpatient   07/29/2017 2347 07/30/2017 2033 Full Code [id number]  Recardo Evangelist, PA-C ED   01/08/2015 2013 01/09/2015 2017 Full Code [id number]  [name], MD Inpatient   01/04/2015 1915 01/08/2015 2013 Full Code [id number]  [name], MD Inpatient   12/01/2014 1735 12/02/2014 1242 Full Code [id number]  [name], MD ED   08/28/2013 1748 08/31/2013 1627 Full Code [id number]  [name], NP Inpatient   08/28/2013 1301 08/28/2013 1748 Full Code [id number]  Pollina,  Gwenyth Allegra., MD ED   05/10/2011 2141 05/12/2011 1747 Full Code [id number]  [name], PA-C ED      Family Communication: None at bedside Disposition Plan:  Status is: Inpatient  Remains inpatient appropriate because:Inpatient level of care appropriate due to severity of illness  Dispo: The patient is from: ALF              Anticipated d/c is to: ALF vs SNF               Patient currently is not medically stable to d/c.    Difficult to place patient No      Antimicrobials:  Anti-infectives (From admission, onward)    None        Objective: Vitals:   08/23/20 1158 08/23/20 2155 08/24/20 0542 08/24/20 1230  BP: 115/70 140/84 137/79 (!) 141/82  Pulse: 75 75 77 79  Resp: 16 16 20 17   Temp: 97.6 F (36.4 C) 97.9 F (36.6 C) 97.8 F (36.6 C) 98.2 F (36.8 C)  TempSrc: Oral Oral Oral Oral  SpO2: 100% 98% 99% 99%  Weight:      Height:        Intake/Output Summary (Last 24 hours) at 08/24/2020 1245 Last data filed at 08/24/2020 1200 Gross per 24 hour  Intake 540 ml  Output 800 ml  Net -260 ml    Filed Weights   08/21/20 1631 08/22/20 1636 08/23/20 0500  Weight: (!) 149.7 kg (!) 139.3 kg (!) 141.7 kg   Examination: General exam: Appears calm and comfortable  Respiratory system: Clear to auscultation. Respiratory effort normal.  On room air Cardiovascular system: S1 & S2 heard, RRR. + Diffuse anasarca, pitting edema of the lower extremities extending up toward to his flank.  Also with pitting edema of the entire right upper extremity Gastrointestinal system: Abdomen is nondistended, soft and nontender. Normal bowel sounds heard. Central nervous system: Alert and oriented. Non focal exam.  Skin: No rashes, lesions or ulcers on exposed skin  Psychiatry: Judgement and insight appear stable. Mood & affect appropriate.    Data Reviewed: I have personally reviewed following labs and imaging studies  CBC: Recent Labs  Lab 08/18/20 0123 08/21/20 1824  WBC 4.1 3.4*  NEUTROABS 1.8 1.8  HGB 9.8* 10.4*  HCT 31.5* 33.5*  MCV 79.9* 79.8*  PLT 245 250    Basic Metabolic Panel: Recent Labs  Lab 08/18/20 0123 08/21/20 1646 08/22/20 0615 08/23/20 0441  NA 136 136 139 139  K 3.5 3.1* 3.0* 3.0*  CL 103 106 107 107  CO2 23 23 24 24   GLUCOSE 147* 189* 154* 130*  BUN 35* 34* 32* 31*  CREATININE 1.51* 2.05* 2.04* 1.87*  CALCIUM 8.6* 8.3* 8.2* 8.1*    GFR: Estimated Creatinine  Clearance: 58.8 mL/min (A) (by C-G formula based on SCr of 1.87 mg/dL (H)). Liver Function Tests: Recent Labs  Lab 08/21/20 1646  AST 18  ALT 14  ALKPHOS 88  BILITOT 0.6  PROT 6.6  ALBUMIN 2.4*    No results for input(s): LIPASE, AMYLASE in the last 168 hours. No results for input(s): AMMONIA in the last 168 hours. Coagulation Profile: No results for input(s): INR, PROTIME in the last 168 hours. Cardiac Enzymes: No results for input(s): CKTOTAL, CKMB, CKMBINDEX, TROPONINI in the last 168 hours. BNP (last 3 results) No results for input(s): PROBNP in the last 8760 hours. HbA1C: No results for input(s): HGBA1C in the last 72 hours. CBG: Recent Labs  Lab 08/23/20 1112  08/23/20 1609 08/23/20 2151 08/24/20 0737 08/24/20 1227  GLUCAP 132* 126* 124* 120* 125*    Lipid Profile: No results for input(s): CHOL, HDL, LDLCALC, TRIG, CHOLHDL, LDLDIRECT in the last 72 hours. Thyroid Function Tests: No results for input(s): TSH, T4TOTAL, FREET4, T3FREE, THYROIDAB in the last 72 hours. Anemia Panel: Recent Labs    08/23/20 0441  VITAMINB12 239  FOLATE 7.2  FERRITIN 72  TIBC 246*  IRON 30*  RETICCTPCT 1.8    Sepsis Labs: No results for input(s): PROCALCITON, LATICACIDVEN in the last 168 hours.  Recent Results (from the past 240 hour(s))  Resp Panel by RT-PCR (Flu A&B, Covid) Nasopharyngeal Swab     Status: None   Collection Time: 08/21/20 11:31 PM   Specimen: Nasopharyngeal Swab; Nasopharyngeal(NP) swabs in vial transport medium  Result Value Ref Range Status   SARS Coronavirus 2 by RT PCR NEGATIVE NEGATIVE Final    Comment: (NOTE) SARS-CoV-2 target nucleic acids are NOT DETECTED.  The SARS-CoV-2 RNA is generally detectable in upper respiratory specimens during the acute phase of infection. The lowest concentration of SARS-CoV-2 viral copies this assay can detect is 138 copies/mL. A negative result does not preclude SARS-Cov-2 infection and should not be used as the  sole basis for treatment or other patient management decisions. A negative result may occur with  improper specimen collection/handling, submission of specimen other than nasopharyngeal swab, presence of viral mutation(s) within the areas targeted by this assay, and inadequate number of viral copies(<138 copies/mL). A negative result must be combined with clinical observations, patient history, and epidemiological information. The expected result is Negative.  Fact Sheet for Patients:  EntrepreneurPulse.com.au  Fact Sheet for Healthcare Providers:  IncredibleEmployment.be  This test is no t yet approved or cleared by the [place] FDA and  has been authorized for detection and/or diagnosis of SARS-CoV-2 by FDA under an Emergency Use Authorization (EUA). This EUA will remain  in effect (meaning this test can be used) for the duration of the COVID-19 declaration under Section 564(b)(1) of the Act, 21 U.S.C.section 360bbb-3(b)(1), unless the authorization is terminated  or revoked sooner.       Influenza A by PCR NEGATIVE NEGATIVE Final   Influenza B by PCR NEGATIVE NEGATIVE Final    Comment: (NOTE) The Xpert Xpress SARS-CoV-2/FLU/RSV plus assay is intended as an aid in the diagnosis of influenza from Nasopharyngeal swab specimens and should not be used as a sole basis for treatment. Nasal washings and aspirates are unacceptable for Xpert Xpress SARS-CoV-2/FLU/RSV testing.  Fact Sheet for Patients: EntrepreneurPulse.com.au  Fact Sheet for Healthcare Providers: IncredibleEmployment.be  This test is not yet approved or cleared by the [place] FDA and has been authorized for detection and/or diagnosis of SARS-CoV-2 by FDA under an Emergency Use Authorization (EUA). This EUA will remain in effect (meaning this test can be used) for the duration of the COVID-19 declaration under Section 564(b)(1) of the  Act, 21 U.S.C. section 360bbb-3(b)(1), unless the authorization is terminated or revoked.  Performed at [facility], [place] [address], [place], [place] [address]   MRSA PCR Screening     Status: None   Collection Time: 08/22/20  5:39 PM  Result Value Ref Range Status   MRSA by PCR NEGATIVE NEGATIVE Final    Comment:        The GeneXpert MRSA Assay (FDA approved for NASAL specimens only), is one component of a comprehensive MRSA colonization surveillance program. It is not intended to diagnose MRSA infection  nor to guide or monitor treatment for MRSA infections. Performed at [facility], [place] [address] [place], [name] [address]   C Difficile Quick Screen w PCR reflex     Status: None   Collection Time: 08/23/20  4:57 PM   Specimen: STOOL  Result Value Ref Range Status   C Diff antigen NEGATIVE NEGATIVE Final   C Diff toxin NEGATIVE NEGATIVE Final   C Diff interpretation No C. difficile detected.  Final    Comment: Performed at [facility], [place] [address] [place], [place] [address]       Radiology Studies: VAS US RENAL ARTERY DUPLEX  Result Date: 08/23/2020 ABDOMINAL VISCERAL Patient Name:  [name]  Date of Exam:   08/23/2020 Medical Rec #: [id number]      Accession #:    [id number] Date of Birth: 04-Aug-1960       Patient Gender: M Patient Age:   60Y Exam Location:  [facility] Procedure:      VAS US RENAL ARTERY DUPLEX Referring Phys: [id number] [place] -------------------------------------------------------------------------------- Indications: Uncontrolled HTN Limitations: Obesity and Patient inability to cooperate with positioning or breathing. Comparison Study: No previous exam Performing Technologist: Jody Hill RVT, RDMS  Examination Guidelines: A complete evaluation includes B-mode imaging, spectral Doppler, color Doppler, and power Doppler as needed of all accessible portions of each vessel.  Bilateral testing is considered an integral part of a complete examination. Limited examinations for reoccurring indications may be performed as noted.  Duplex Findings: +----------------------+--------+--------+------+--------------+ Mesenteric            PSV cm/sEDV cm/sPlaque   Comments    +----------------------+--------+--------+------+--------------+ Aorta at SMA                                               +----------------------+--------+--------+------+--------------+ Aorta Prox               56      17                        +----------------------+--------+--------+------+--------------+ Celiac Artery Proximal                      Not visualized +----------------------+--------+--------+------+--------------+ SMA Proximal                                Not visualized +----------------------+--------+--------+------+--------------+    +------------------+--------+--------+--------------+ Right Renal ArteryPSV cm/sEDV cm/s   Comment     +------------------+--------+--------+--------------+ Origin                            Not visualized +------------------+--------+--------+--------------+ Proximal                          Not visualized +------------------+--------+--------+--------------+ Mid                               Not visualized +------------------+--------+--------+--------------+ Distal                            Not visualized +------------------+--------+--------+--------------+ +-----------------+--------+--------+--------------+ Left Renal ArteryPSV cm/sEDV cm/s   Comment     +-----------------+--------+--------+--------------+  Origin                           Not visualized +-----------------+--------+--------+--------------+ Proximal                         Not visualized +-----------------+--------+--------+--------------+ Mid                              Not visualized  +-----------------+--------+--------+--------------+ Distal                           Not visualized +-----------------+--------+--------+--------------+  Technologist observations: SEVERELY limited exam due to combination of body habitus and patient inability to cooperate for exam. Patient stated he could not position onto his side. Patient could not take a deep breath and hold. +------------+--------+--------+----+-----------+--------+--------+---+ Right KidneyPSV cm/sEDV cm/sRI  Left KidneyPSV cm/sEDV cm/sRI  +------------+--------+--------+----+-----------+--------+--------+---+ Upper Pole                      Upper Pole                     +------------+--------+--------+----+-----------+--------+--------+---+ Mid         43      10      0.76Mid                            +------------+--------+--------+----+-----------+--------+--------+---+ Lower Pole                      Lower Pole                     +------------+--------+--------+----+-----------+--------+--------+---+ Hilar       23      6       0.74Hilar                          +------------+--------+--------+----+-----------+--------+--------+---+ +------------------+-----+------------------+--------------+ Right Kidney           Left Kidney                      +------------------+-----+------------------+--------------+ RAR                    RAR                              +------------------+-----+------------------+--------------+ RAR (manual)           RAR (manual)                     +------------------+-----+------------------+--------------+ Cortex            0.68 Cortex            Not visualized +------------------+-----+------------------+--------------+ Cortex thickness       Corex thickness                  +------------------+-----+------------------+--------------+ Kidney length (cm)11.60Kidney length (cm)11.70           +------------------+-----+------------------+--------------+  Summary:  *See table(s) above for measurements and observations.  Diagnosing physician: Curt Jews MD  Electronically signed by Curt Jews MD on 08/23/2020 at 3:01:48 PM.    Final    VAS [place] UPPER EXTREMITY VENOUS DUPLEX  Result Date: 08/22/2020  UPPER VENOUS STUDY  Patient Name:  [name]  Date of Exam:   08/22/2020 Medical Rec #: [id number]      Accession #:    [id number] Date of Birth: 02-May-1960       Patient Gender: M Patient Age:   60Y Exam Location:  [facility] Procedure:      VAS [place] UPPER EXTREMITY VENOUS DUPLEX Referring Phys: [place] --------------------------------------------------------------------------------  Indications: Swelling Risk Factors: None identified. Limitations: Poor ultrasound/tissue interface and body habitus. Comparison Study: No prior studies. Performing Technologist: Oliver Hum RVT  Examination Guidelines: A complete evaluation includes B-mode imaging, spectral Doppler, color Doppler, and power Doppler as needed of all accessible portions of each vessel. Bilateral testing is considered an integral part of a complete examination. Limited examinations for reoccurring indications may be performed as noted.  Right Findings: +----------+------------+---------+-----------+----------+-------+ RIGHT     CompressiblePhasicitySpontaneousPropertiesSummary +----------+------------+---------+-----------+----------+-------+ IJV           Full       Yes       Yes                      +----------+------------+---------+-----------+----------+-------+ Subclavian    Full       Yes       Yes                      +----------+------------+---------+-----------+----------+-------+ Axillary      Full       Yes       Yes                      +----------+------------+---------+-----------+----------+-------+ Brachial      Full       Yes       Yes                       +----------+------------+---------+-----------+----------+-------+ Radial        Full                                          +----------+------------+---------+-----------+----------+-------+ Ulnar         Full                                          +----------+------------+---------+-----------+----------+-------+ Cephalic      Full                                          +----------+------------+---------+-----------+----------+-------+ Basilic       Full                                          +----------+------------+---------+-----------+----------+-------+  Left Findings: +----------+------------+---------+-----------+----------+-------+ LEFT      CompressiblePhasicitySpontaneousPropertiesSummary +----------+------------+---------+-----------+----------+-------+ Subclavian    Full       Yes       Yes                      +----------+------------+---------+-----------+----------+-------+  Summary:  Right: No evidence of deep vein thrombosis in the upper  extremity. No evidence of superficial vein thrombosis in the upper extremity.  Left: No evidence of thrombosis in the subclavian.  *See table(s) above for measurements and observations.  Diagnosing physician: Deitra Mayo MD Electronically signed by Deitra Mayo MD on 08/22/2020 at 2:55:09 PM.    Final    ECHOCARDIOGRAM LIMITED  Result Date: 08/22/2020    ECHOCARDIOGRAM LIMITED REPORT   Patient Name:   [name] Date of Exam: 08/22/2020 Medical Rec #:  [id number]     Height:       68.0 in Accession #:    [id number]    Weight:       330.0 lb Date of Birth:  22-May-1960      BSA:          2.528 m Patient Age:    42 years      BP:           138/89 mmHg Patient Gender: M             HR:           72 bpm. Exam Location:  Inpatient Procedure: Limited Echo, Limited Color Doppler, Cardiac Doppler and Intracardiac            Opacification Agent Indications:    Pericardial effusion I31.3  History:         Patient has prior history of Echocardiogram examinations, most                 recent 06/25/2020. CHF, Stroke, Aortic Valve Disease,                 Signs/Symptoms:Chest Pain, Murmur and Shortness of Breath; Risk                 Factors:Hypertension and Diabetes. Lower extremity edema.  Sonographer:    Darlina Sicilian RDCS Referring Phys: [place]  Sonographer Comments: Restricted mobility. IMPRESSIONS  1. Left ventricular ejection fraction, by estimation, is 65 to 70%. The left ventricle has normal function. The left ventricle has no regional wall motion abnormalities. There is moderate concentric left ventricular hypertrophy. Left ventricular diastolic parameters are consistent with Grade II diastolic dysfunction (pseudonormalization). Elevated left atrial pressure.  2. Left atrial size was moderately dilated.  3. A small pericardial effusion is present. The pericardial effusion is mostly localized near the right ventricle. There is no evidence of cardiac tamponade.  4. Disproportionately severe right coronary cusp calcification is seen. The aortic valve is tricuspid. There is moderate calcification of the aortic valve. There is moderate thickening of the aortic valve. Aortic valve regurgitation is mild. Mild aortic  valve stenosis.  5. Tricuspid regurgitation signal is inadequate for assessing PA pressure.  6. The inferior vena cava is dilated in size with <50% respiratory variability, suggesting right atrial pressure of 15 mmHg. Comparison(s): Prior images reviewed side by side. The left ventricular function is unchanged. The left ventricular diastolic function is significantly worse. Filling pressures have increased. The pericardial effusion is unchanged or slightly smaller. FINDINGS  Left Ventricle: Left ventricular ejection fraction, by estimation, is 65 to 70%. The left ventricle has normal function. The left ventricle has no regional wall motion abnormalities. Definity contrast agent was given IV  to delineate the left ventricular  endocardial borders. The left ventricular internal cavity size was normal in size. There is moderate concentric left ventricular hypertrophy. Left ventricular diastolic parameters are consistent with Grade II diastolic dysfunction (pseudonormalization).  Elevated left atrial pressure. Right Ventricle: Tricuspid regurgitation  signal is inadequate for assessing PA pressure. Left Atrium: Left atrial size was moderately dilated. Right Atrium: Right atrial size was normal in size. Pericardium: A small pericardial effusion is present. The pericardial effusion is localized near the right ventricle. There is no evidence of cardiac tamponade. Aortic Valve: Disproportionately severe right coronary cusp calcification is seen. The aortic valve is tricuspid. There is moderate calcification of the aortic valve. There is moderate thickening of the aortic valve. Aortic valve regurgitation is mild. Mild aortic stenosis is present. Aortic valve mean gradient measures 9.0 mmHg. Aortic valve peak gradient measures 16.7 mmHg. Aorta: The aortic root and ascending aorta are structurally normal, with no evidence of dilitation. Venous: The inferior vena cava is dilated in size with less than 50% respiratory variability, suggesting right atrial pressure of 15 mmHg. LEFT VENTRICLE PLAX 2D LVIDd:         4.90 cm      Diastology LVIDs:         2.97 cm      LV e' medial:    5.46 cm/s LV PW:         1.50 cm      LV E/e' medial:  14.5 LV IVS:        1.38 cm      LV e' lateral:   5.86 cm/s                             LV E/e' lateral: 13.5  LV Volumes (MOD) LV vol d, MOD A2C: 92.2 ml LV vol d, MOD A4C: 174.0 ml LV vol s, MOD A2C: 48.3 ml  3D Volume EF: LV vol s, MOD A4C: 36.9 ml  3D EF:        68 % LV SV MOD A2C:     43.9 ml  LV EDV:       259 ml LV SV MOD A4C:     174.0 ml LV ESV:       82 ml LV SV MOD BP:      87.6 ml  LV SV:        177 ml LEFT ATRIUM         Index LA diam:    4.50 cm 1.78 cm/m  AORTIC VALVE AV  Vmax:           204.50 cm/s AV Vmean:          137.500 cm/s AV VTI:            0.393 m AV Peak Grad:      16.7 mmHg AV Mean Grad:      9.0 mmHg LVOT Vmax:         72.60 cm/s LVOT Vmean:        47.600 cm/s LVOT VTI:          0.163 m LVOT/AV VTI ratio: 0.41 MITRAL VALVE MV Area (PHT): 2.93 cm    SHUNTS MV Decel Time: 259 msec    Systemic VTI: 0.16 m MV E velocity: 79.00 cm/s MV A velocity: 83.60 cm/s MV E/A ratio:  0.94 Mihai Croitoru MD Electronically signed by Sanda Klein MD Signature Date/Time: 08/22/2020/5:00:35 PM    Final       Scheduled Meds:  amLODipine  10 mg Oral Daily   aspirin  81 mg Oral Daily   atorvastatin  80 mg Oral QHS   carvedilol  12.5 mg Oral BID WC   enoxaparin (LOVENOX) injection  40 mg Subcutaneous Q24H   furosemide  40 mg Intravenous BID   insulin aspart  0-9 Units Subcutaneous TID WC   insulin aspart  3 Units Subcutaneous TID WC   insulin glargine  20 Units Subcutaneous QHS   isosorbide-hydrALAZINE  1 tablet Oral TID   pantoprazole  40 mg Oral Daily   pregabalin  75 mg Oral BID   saccharomyces boulardii  250 mg Oral BID   sertraline  50 mg Oral Daily   sodium chloride flush  3 mL Intravenous Q12H   spironolactone  25 mg Oral Daily   tamsulosin  0.8 mg Oral Daily   Continuous Infusions:  sodium chloride       LOS: 2 days      Time spent: 25 minutes   Dessa Phi, DO [facility] 08/24/2020, 12:45 PM   Available via Epic secure chat 7am-7pm After these hours, please refer to coverage provider listed on [email or web address]

## 2020-08-25 ENCOUNTER — Inpatient Hospital Stay (HOSPITAL_COMMUNITY): Payer: Medicare HMO

## 2020-08-25 DIAGNOSIS — N401 Enlarged prostate with lower urinary tract symptoms: Secondary | ICD-10-CM | POA: Diagnosis not present

## 2020-08-25 DIAGNOSIS — E1165 Type 2 diabetes mellitus with hyperglycemia: Secondary | ICD-10-CM | POA: Diagnosis not present

## 2020-08-25 DIAGNOSIS — I5033 Acute on chronic diastolic (congestive) heart failure: Secondary | ICD-10-CM | POA: Diagnosis not present

## 2020-08-25 DIAGNOSIS — I25119 Atherosclerotic heart disease of native coronary artery with unspecified angina pectoris: Secondary | ICD-10-CM | POA: Diagnosis not present

## 2020-08-25 DIAGNOSIS — I10 Essential (primary) hypertension: Secondary | ICD-10-CM | POA: Diagnosis not present

## 2020-08-25 LAB — BASIC METABOLIC PANEL
Anion gap: 8 (ref 5–15)
BUN: 33 mg/dL — ABNORMAL HIGH (ref 6–20)
CO2: 22 mmol/L (ref 22–32)
Calcium: 7.9 mg/dL — ABNORMAL LOW (ref 8.9–10.3)
Chloride: 106 mmol/L (ref 98–111)
Creatinine, Ser: 2.11 mg/dL — ABNORMAL HIGH (ref 0.61–1.24)
GFR, Estimated: 35 mL/min — ABNORMAL LOW (ref >60)
Glucose, Bld: 124 mg/dL — ABNORMAL HIGH (ref 70–99)
Potassium: 4.9 mmol/L (ref 3.5–5.1)
Sodium: 136 mmol/L (ref 135–145)

## 2020-08-25 LAB — GLUCOSE, CAPILLARY
Glucose-Capillary: 127 mg/dL — ABNORMAL HIGH (ref 70–99)
Glucose-Capillary: 114 mg/dL — ABNORMAL HIGH (ref 70–99)
Glucose-Capillary: 164 mg/dL — ABNORMAL HIGH (ref 70–99)
Glucose-Capillary: 135 mg/dL — ABNORMAL HIGH (ref 70–99)

## 2020-08-25 MED ORDER — FUROSEMIDE 10 MG/ML IJ SOLN
80.0000 mg | Freq: Two times a day (BID) | INTRAMUSCULAR | Status: DC
  Administered 2020-08-25 – 2020-09-05 (×22): 80 mg via INTRAVENOUS
  Filled 2020-08-25 (×23): qty 8

## 2020-08-25 NOTE — Progress Notes (Signed)
RN noticed pt had not urinated today. When RN asked pt, he said he did not have the urge to urinate. RN bladder scanned pt x3 with the result of 751 mL in pt bladder. RN notified Dr. Maylene Roes of findings and received an order to place a foley for strict I&O monitoring. When RN explained this to pt, pt began to yell at RN saying he was "not having anything stuck into his bladder, no matter what". RN tried to reassure pt without success. RN notified MD of pt's response and that RN was not comfortable administering scheduled lasix.

## 2020-08-25 NOTE — Progress Notes (Addendum)
PROGRESS NOTE    [name]  VQQ:[id number] DOB: 02/18/1961 DOA: 08/21/2020 PCP: [name], NP     Brief Narrative:  [name] is a 60 year old man PMH including diastolic CHF, diabetes, morbid obesity, stroke presented with swelling, chest pain or shortness of breath.  Admitted for acute on chronic diastolic CHF.  Massive volume overload noted, cardiology consulted, continue diuresis, monitor renal function.   New events last 24 hours / Subjective: Continues to have anasarca.  States that his legs continue to feel very heavy.  Remains on room air today.  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   Pericardial effusion   Acute on chronic diastolic CHF (congestive heart failure) (HCC)   Acute on chronic diastolic heart failure -Echocardiogram revealed EF 65 to 43%, grade 2 diastolic dysfunction -Cardiology following  -Continue Lasix, Coreg -Continue fluid restriction, daily weight and strict I's and O's -Discussed with Cardiology, will increase to Lasix 80mg  IV BID for next couple of days and monitor   AKI on CKD stage 3A -Baseline creatinine 1.5 -Creatinine remains around 2.1 -Discussed with Nephrology, no recommendations from renal standpoint, would leave up to cardiology/heart failure regarding diuresis. If Cr continues to worsen > 3, call back for formal consult   RUE swelling -Venous Doppler negative for DVT in right upper extremity -?Lymphedema, check CT RUE  Hypoalbuminemia  -Albumin 2.4  -Also contributing to his anasarca  -Dietitian consulted   Pericardial effusion -No signs or  symptoms of tamponade  History of CVA with residual right-sided weakness -PT OT recommending SNF placement -Continue aspirin, Lipitor  Diabetes mellitus type 2, uncontrolled with hyperglycemia -Continue Lantus, NovoLog, sliding scale insulin  Essential hypertension -Continue amlodipine, Coreg, Lasix, BiDil -Renal artery duplex unfortunately limited due to body habitus  Depression -Continue Zoloft  BPH -Continue Flomax  Microcytic anemia -Iron studies consistent with anemia of chronic disease  Diarrhea -C. difficile negative, GI PCR negative    DVT prophylaxis:  enoxaparin (LOVENOX) injection 40 mg Start: 08/22/20 1000  Code Status:     Code Status Orders  (From admission, onward)           Start     Ordered   08/22/20 0524  Full code  Continuous        08/22/20 0531           Code Status History     Date Active Date Inactive Code Status Order ID Comments User Context   06/30/2020 1243 07/05/2020 2345 Full Code [id number]  Geradine Girt, DO ED   06/25/2020 0944 06/28/2020 2024 Full Code [id number]  Norval Morton, MD ED   01/17/2020 1708 01/31/2020 1753 DNR [id number]  Rosezella Rumpf, NP Inpatient   12/31/2019 2311 01/17/2020 1708 Full Code [id number]  [name], MD ED   12/31/2017 1739 01/02/2018 1315 Full Code [id number]  [name], [name], MD Inpatient   09/28/2017 1434 10/01/2017 1948 Full Code [id number]  [name], [place], DO ED   07/30/2017 2111 08/04/2017 1746 Full Code [id number]  Rankin, [name], NP Inpatient   07/29/2017 2347 07/30/2017 2033 Full Code [id number]  [name] ED   01/08/2015 2013 01/09/2015 2017 Full Code [id number]  [name], MD Inpatient   01/04/2015 1915 01/08/2015 2013 Full Code [id number]  [name], MD Inpatient   12/01/2014 1735 12/02/2014 1242 Full Code [id number]  [name], MD ED   08/28/2013 1748 08/31/2013 1627 Full Code [id number]  [name], NP Inpatient   08/28/2013 1301 08/28/2013 1748 Full Code  [id number]  [name], MD ED   05/10/2011 2141 05/12/2011 1747 Full Code [id number]  [name], PA-C ED      Family Communication: None at bedside Disposition Plan:  Status is: Inpatient  Remains inpatient appropriate because:Inpatient level of care appropriate due to severity of illness  Dispo: The patient is from: ALF              Anticipated d/c is to: SNF               Patient currently is not medically stable to d/c.   Difficult to place patient No      Antimicrobials:  Anti-infectives (From admission, onward)    None        Objective: Vitals:   08/24/20 2232 08/25/20 0500 08/25/20 0633 08/25/20 1231  BP: 125/75  125/79 (!) 156/87  Pulse: 73  74 69  Resp: 15  18 19   Temp: 98.2 F (36.8 C)  98.5 F (36.9 C) 98.2 F (36.8 C)  TempSrc: Oral  Oral Oral  SpO2: 100%  99% 95%  Weight:  (!) 141.3 kg    Height:        Intake/Output Summary (Last 24 hours) at 08/25/2020 1311 Last data filed at 08/25/2020 0900 Gross per 24 hour  Intake 817 ml  Output 750 ml  Net 67 ml    Filed Weights   08/22/20 1636 08/23/20 0500 08/25/20 0500  Weight: (!) 139.3 kg (!) 141.7 kg (!) 141.3 kg    Examination: General exam: Appears calm and comfortable  Respiratory system: Clear to auscultation. Respiratory effort normal.  On room air Cardiovascular system: S1 & S2 heard, RRR. + Bilateral pedal edema extending to dependent flank region Gastrointestinal system: Abdomen is nondistended, soft and nontender. Normal bowel sounds heard. Central nervous system: Alert and oriented. Non focal exam. Speech clear  Extremities: Right upper extremity with pitting edema Psychiatry: Judgement and insight appear stable. Mood & affect appropriate.    Data Reviewed: I have personally reviewed following labs and imaging studies  CBC: Recent Labs  Lab 08/21/20 1824  WBC 3.4*  NEUTROABS 1.8  HGB 10.4*  HCT 33.5*  MCV 79.8*  PLT 253    Basic Metabolic Panel: Recent Labs   Lab 08/21/20 1646 08/22/20 0615 08/23/20 0441 08/24/20 1308 08/25/20 0813  NA 136 139 139 138 136  K 3.1* 3.0* 3.0* 3.2* 4.9  CL 106 107 107 107 106  CO2 23 24 24 24 22   GLUCOSE 189* 154* 130* 146* 124*  BUN 34* 32* 31* 33* 33*  CREATININE 2.05* 2.04* 1.87* 2.13* 2.11*  CALCIUM 8.3* 8.2* 8.1* 8.0* 7.9*    GFR: Estimated Creatinine Clearance: 52 mL/min (A) (by C-G formula based on SCr of 2.11 mg/dL (H)). Liver Function Tests: Recent Labs  Lab 08/21/20 1646  AST 18  ALT 14  ALKPHOS 88  BILITOT 0.6  PROT 6.6  ALBUMIN 2.4*    No results for input(s): LIPASE, AMYLASE in the last 168 hours. No results for input(s): AMMONIA in the last 168 hours. Coagulation Profile: No results  for input(s): INR, PROTIME in the last 168 hours. Cardiac Enzymes: No results for input(s): CKTOTAL, CKMB, CKMBINDEX, TROPONINI in the last 168 hours. BNP (last 3 results) No results for input(s): PROBNP in the last 8760 hours. HbA1C: No results for input(s): HGBA1C in the last 72 hours. CBG: Recent Labs  Lab 08/24/20 1227 08/24/20 1644 08/24/20 2229 08/25/20 0811 08/25/20 1125  GLUCAP 125* 150* 142* 127* 114*    Lipid Profile: No results for input(s): CHOL, HDL, LDLCALC, TRIG, CHOLHDL, LDLDIRECT in the last 72 hours. Thyroid Function Tests: No results for input(s): TSH, T4TOTAL, FREET4, T3FREE, THYROIDAB in the last 72 hours. Anemia Panel: Recent Labs    08/23/20 0441  VITAMINB12 239  FOLATE 7.2  FERRITIN 72  TIBC 246*  IRON 30*  RETICCTPCT 1.8    Sepsis Labs: No results for input(s): PROCALCITON, LATICACIDVEN in the last 168 hours.  Recent Results (from the past 240 hour(s))  Resp Panel by RT-PCR (Flu A&B, Covid) Nasopharyngeal Swab     Status: None   Collection Time: 08/21/20 11:31 PM   Specimen: Nasopharyngeal Swab; Nasopharyngeal(NP) swabs in vial transport medium  Result Value Ref Range Status   SARS Coronavirus 2 by RT PCR NEGATIVE NEGATIVE Final    Comment:  (NOTE) SARS-CoV-2 target nucleic acids are NOT DETECTED.  The SARS-CoV-2 RNA is generally detectable in upper respiratory specimens during the acute phase of infection. The lowest concentration of SARS-CoV-2 viral copies this assay can detect is 138 copies/mL. A negative result does not preclude SARS-Cov-2 infection and should not be used as the sole basis for treatment or other patient management decisions. A negative result may occur with  improper specimen collection/handling, submission of specimen other than nasopharyngeal swab, presence of viral mutation(s) within the areas targeted by this assay, and inadequate number of viral copies(<138 copies/mL). A negative result must be combined with clinical observations, patient history, and epidemiological information. The expected result is Negative.  Fact Sheet for Patients:  EntrepreneurPulse.com.au  Fact Sheet for Healthcare Providers:  IncredibleEmployment.be  This test is no t yet approved or cleared by the [place] FDA and  has been authorized for detection and/or diagnosis of SARS-CoV-2 by FDA under an Emergency Use Authorization (EUA). This EUA will remain  in effect (meaning this test can be used) for the duration of the COVID-19 declaration under Section 564(b)(1) of the Act, 21 U.S.C.section 360bbb-3(b)(1), unless the authorization is terminated  or revoked sooner.       Influenza A by PCR NEGATIVE NEGATIVE Final   Influenza B by PCR NEGATIVE NEGATIVE Final    Comment: (NOTE) The Xpert Xpress SARS-CoV-2/FLU/RSV plus assay is intended as an aid in the diagnosis of influenza from Nasopharyngeal swab specimens and should not be used as a sole basis for treatment. Nasal washings and aspirates are unacceptable for Xpert Xpress SARS-CoV-2/FLU/RSV testing.  Fact Sheet for Patients: EntrepreneurPulse.com.au  Fact Sheet for Healthcare  Providers: IncredibleEmployment.be  This test is not yet approved or cleared by the [place] FDA and has been authorized for detection and/or diagnosis of SARS-CoV-2 by FDA under an Emergency Use Authorization (EUA). This EUA will remain in effect (meaning this test can be used) for the duration of the COVID-19 declaration under Section 564(b)(1) of the Act, 21 U.S.C. section 360bbb-3(b)(1), unless the authorization is terminated or revoked.  Performed at [facility], [place] [address], [place], [place] [address]   MRSA PCR Screening     Status: None   Collection Time: 08/22/20  5:39 PM  Result Value Ref Range Status   MRSA by PCR NEGATIVE NEGATIVE Final    Comment:        The GeneXpert MRSA Assay (FDA approved for NASAL specimens only), is one component of a comprehensive MRSA colonization surveillance program. It is not intended to diagnose MRSA infection nor to guide or monitor treatment for MRSA infections. Performed at [facility], [place] [address], [place], [place] [address]   C Difficile Quick Screen w PCR reflex     Status: None   Collection Time: 08/23/20  4:57 PM   Specimen: STOOL  Result Value Ref Range Status   C Diff antigen NEGATIVE NEGATIVE Final   C Diff toxin NEGATIVE NEGATIVE Final   C Diff interpretation No C. difficile detected.  Final    Comment: Performed at [facility], [place] [address] [name], [place] [address]  Gastrointestinal Panel by PCR , Stool     Status: None   Collection Time: 08/23/20  4:58 PM   Specimen: STOOL  Result Value Ref Range Status   Campylobacter species NOT DETECTED NOT DETECTED Final   Plesimonas shigelloides NOT DETECTED NOT DETECTED Final   Salmonella species NOT DETECTED NOT DETECTED Final   Yersinia enterocolitica NOT DETECTED NOT DETECTED Final   Vibrio species NOT DETECTED NOT DETECTED Final   Vibrio cholerae NOT DETECTED NOT DETECTED Final    Enteroaggregative E coli (EAEC) NOT DETECTED NOT DETECTED Final   Enteropathogenic E coli (EPEC) NOT DETECTED NOT DETECTED Final   Enterotoxigenic E coli (ETEC) NOT DETECTED NOT DETECTED Final   Shiga like toxin producing E coli (STEC) NOT DETECTED NOT DETECTED Final   Shigella/Enteroinvasive E coli (EIEC) NOT DETECTED NOT DETECTED Final   Cryptosporidium NOT DETECTED NOT DETECTED Final   Cyclospora cayetanensis NOT DETECTED NOT DETECTED Final   Entamoeba histolytica NOT DETECTED NOT DETECTED Final   Giardia lamblia NOT DETECTED NOT DETECTED Final   Adenovirus F40/41 NOT DETECTED NOT DETECTED Final   Astrovirus NOT DETECTED NOT DETECTED Final   Norovirus GI/GII NOT DETECTED NOT DETECTED Final   Rotavirus A NOT DETECTED NOT DETECTED Final   Sapovirus (I, II, IV, and V) NOT DETECTED NOT DETECTED Final    Comment: Performed at [facility], [address] [place], [place] [address]       Radiology Studies: No results found.    Scheduled Meds:  amLODipine  10 mg Oral Daily   aspirin  81 mg Oral Daily   atorvastatin  80 mg Oral QHS   carvedilol  12.5 mg Oral BID WC   enoxaparin (LOVENOX) injection  40 mg Subcutaneous Q24H   furosemide  40 mg Intravenous BID   insulin aspart  0-9 Units Subcutaneous TID WC   insulin aspart  3 Units Subcutaneous TID WC   insulin glargine  20 Units Subcutaneous QHS   isosorbide-hydrALAZINE  1 tablet Oral TID   pantoprazole  40 mg Oral Daily   pregabalin  75 mg Oral BID   saccharomyces boulardii  250 mg Oral BID   sertraline  50 mg Oral Daily   sodium chloride flush  3 mL Intravenous Q12H   tamsulosin  0.8 mg Oral Daily   Continuous Infusions:  sodium chloride       LOS: 3 days      Time spent: 45 minutes   Dessa Phi, DO [facility] 08/25/2020, 1:11 PM   Available via Epic secure chat 7am-7pm After these hours, please refer to  coverage provider listed on [email or web address]

## 2020-08-25 NOTE — Plan of Care (Signed)
  Problem: Education: Goal: Knowledge of General Education information will improve Description: Including pain rating scale, medication(s)/side effects and non-pharmacologic comfort measures Outcome: Progressing   Problem: Clinical Measurements: Goal: Will remain free from infection Outcome: Progressing   Problem: Skin Integrity: Goal: Risk for impaired skin integrity will decrease Outcome: Progressing   

## 2020-08-25 NOTE — Progress Notes (Signed)
Discussed with Dr. Maylene Roes. Recommend increasing lasix to 80 mg bid, given volume overload today. Strongly recommend I/O and accurate daily weights. Monitor renal function.   Nigel Mormon, MD Pager: [phone] Office: [phone]

## 2020-08-25 NOTE — Progress Notes (Signed)
Nutrition Brief Note  Consult received for assessment of hypoalbuminemia.   Wt Readings from Last 15 Encounters:  08/25/20 (!) 141.3 kg  08/17/20 115.7 kg  08/07/20 115.7 kg  08/05/20 114.8 kg  07/26/20 114.8 kg  07/24/20 114.8 kg  07/11/20 115 kg  06/30/20 115 kg  06/28/20 115.1 kg  03/08/20 110.7 kg  01/25/20 111.6 kg  12/12/19 110.7 kg  01/29/18 123.4 kg  01/02/18 120.8 kg  09/28/17 123 kg    Body mass index is 47.36 kg/m. Patient meets criteria for morbid obesity based on current BMI. Skin WDL.   Current diet order is Renal with 1.2L fluid restriction and he is eating mainly 75-100% at meals.  Labs and medications reviewed.   He was admitted for swelling/edema, chest pain, and SOB. Admitting dx of CHF exacerbation. Cardiology following and patient being diuresed.   Albumin has a half-life of 21 days and is strongly affected by stress response and inflammatory process, therefore, do not expect to see an improvement in this lab value during acute hospitalization.   No nutrition interventions warranted at this time. If nutrition issues arise, please consult RD.       Jarome Matin, MS, RD, LDN, CNSC Inpatient Clinical Dietitian RD pager # available in [place]  After hours/weekend pager # available in [facility]

## 2020-08-25 NOTE — Care Management Important Message (Signed)
Important Message  Patient Details IM Letter given to the Patient. Name: [name] MRN: [id number] Date of Birth: May 30, 1960   Medicare Important Message Given:  Yes     Kerin Salen 08/25/2020, 1:50 PM

## 2020-08-25 NOTE — Progress Notes (Signed)
Subjective:  Breathing okay  He tells me that he has not gotten out of bed during this hospitalization  Right arm larger than left arm. No DVT.  Objective:  Vital Signs in the last 24 hours: Temp:  [98.2 F (36.8 C)-98.5 F (36.9 C)] 98.2 F (36.8 C) (07/01 1231) Pulse Rate:  [69-74] 69 (07/01 1231) Resp:  [15-19] 19 (07/01 1231) BP: (125-156)/(75-87) 156/87 (07/01 1231) SpO2:  [95 %-100 %] 95 % (07/01 1231) Weight:  [141.3 kg] 141.3 kg (07/01 0500)  Intake/Output from previous day: 06/30 0701 - 07/01 0700 In: 1054 [P.O.:1054] Out: 1050 [Urine:1050]  Physical Exam Vitals and nursing note reviewed.  Constitutional:      General: He is not in acute distress.    Appearance: He is well-developed.  HENT:     Head: Normocephalic and atraumatic.  Eyes:     Conjunctiva/sclera: Conjunctivae normal.     Pupils: Pupils are equal, round, and reactive to light.  Neck:     Vascular: No JVD.  Cardiovascular:     Rate and Rhythm: Normal rate and regular rhythm.     Pulses: Intact distal pulses.     Heart sounds: No murmur heard. Pulmonary:     Effort: Pulmonary effort is normal.     Breath sounds: Normal breath sounds. No wheezing or rales.  Abdominal:     General: Bowel sounds are normal.     Palpations: Abdomen is soft.     Tenderness: There is no rebound.  Musculoskeletal:        General: Swelling (Right arm larger than left arm) present. No tenderness. Normal range of motion.     Right lower leg: Edema (3+) present.     Left lower leg: Edema (3+) present.  Lymphadenopathy:     Cervical: No cervical adenopathy.  Skin:    General: Skin is warm and dry.     Comments: Poor toenail hygiene  Neurological:     Mental Status: He is alert and oriented to person, place, and time.     Cranial Nerves: No cranial nerve deficit.     Comments: RLE weakness      Lab Results: BMP Recent Labs    08/23/20 0441 08/24/20 1308 08/25/20 0813  NA 139 138 136  K 3.0* 3.2* 4.9  CL  107 107 106  CO2 24 24 22   GLUCOSE 130* 146* 124*  BUN 31* 33* 33*  CREATININE 1.87* 2.13* 2.11*  CALCIUM 8.1* 8.0* 7.9*  GFRNONAA 41* 35* 35*     CBC Recent Labs  Lab 08/21/20 1824 08/23/20 0441  WBC 3.4*  --   RBC 4.20* 3.55*  HGB 10.4*  --   HCT 33.5*  --   PLT 252  --   MCV 79.8*  --   MCH 24.8*  --   MCHC 31.0  --   RDW 15.0  --   LYMPHSABS 1.0  --   MONOABS 0.5  --   EOSABS 0.2  --   BASOSABS 0.0  --      HEMOGLOBIN A1C Lab Results  Component Value Date   HGBA1C 9.1 (H) 06/25/2020   MPG 214.47 06/25/2020    Cardiac Panel (last 3 results) Recent Labs    12/31/19 2122 01/02/20 0750  CKTOTAL 970* 568*     BNP (last 3 results) Recent Labs    08/16/20 1025 08/18/20 0123 08/21/20 1801  BNP 245.3* 139.2* 226.2*     TSH Recent Labs    01/01/20 0849 01/18/20 0517  06/25/20 1244  TSH 1.354 2.317 1.714     Lipid Panel     Component Value Date/Time   CHOL 305 (H) 06/25/2020 0711   TRIG 90 06/25/2020 0711   HDL 63 06/25/2020 0711   CHOLHDL 4.8 06/25/2020 0711   VLDL 18 06/25/2020 0711   LDLCALC 224 (H) 06/25/2020 0711     Hepatic Function Panel Recent Labs    01/09/20 1027 01/14/20 0507 06/27/20 0301 06/30/20 0039 07/11/20 1849 08/05/20 1932 08/21/20 1646  PROT 6.9   < > 5.3*   < > 6.6 7.0 6.6  ALBUMIN 2.9*   < > 1.6*   < > 2.4* 2.5* 2.4*  AST 49*   < > 13*   < > 14* 17 18  ALT 35   < > 11   < > 13 15 14   ALKPHOS 94   < > 81   < > 74 97 88  BILITOT 0.6   < > <0.1*   < > 0.4 0.5 0.6  BILIDIR <0.1  --  <0.1  --   --   --   --   IBILI NOT CALCULATED  --  NOT CALCULATED  --   --   --   --    < > = values in this interval not displayed.     Cardiac Studies:  EKG 08/21/2020: Sinus rhythm Anteroseptal infarct, old Nonspecific T abnormalities, lateral leads  Echocardiogram 08/24/2020:  1. Left ventricular ejection fraction, by estimation, is 65 to 70%. The  left ventricle has normal function. The left ventricle has no  regional  wall motion abnormalities. There is moderate concentric left ventricular  hypertrophy. Left ventricular  diastolic parameters are consistent with Grade II diastolic dysfunction  (pseudonormalization). Elevated left atrial pressure.   2. Left atrial size was moderately dilated.   3. A small pericardial effusion is present. The pericardial effusion is  mostly localized near the right ventricle. There is no evidence of cardiac  tamponade.   4. Disproportionately severe right coronary cusp calcification is seen.  The aortic valve is tricuspid. There is moderate calcification of the  aortic valve. There is moderate thickening of the aortic valve. Aortic  valve regurgitation is mild. Mild aortic   valve stenosis.   5. Tricuspid regurgitation signal is inadequate for assessing PA  pressure.   6. The inferior vena cava is dilated in size with <50% respiratory  variability, suggesting right atrial pressure of 15 mmHg.   Assessment & Recommendations:  60 year old African-American male with  uncontrolled hypertension, type 2 diabetes mellitus, h/o stroke, CAD, HFpEF  HFpEF: Acute on chronic.  Primarily triggered by hypertensive urgency and poor dietary compliance. I agree with Dr. Maylene Roes that he is very edematous. Increase lasix to 80 mg bid. Absolutely needs strict I/OI I don't think he is going to tolerate ACEi/ARB/MRA in 6 months, owing to his renal function. Strongly encourage increased mobility, and physical therapy, if possible.  Hypertension: Inadequate renal artery duplex study.   Blood pressure is better controlled.   AKI/CKD:  Likely cardiorenal syndrome, along with intrinsic renal disease. Monitor closely  We will peripherally follow the patient   Nigel Mormon, MD Pager: [phone] Office: [phone]

## 2020-08-26 DIAGNOSIS — I10 Essential (primary) hypertension: Secondary | ICD-10-CM | POA: Diagnosis not present

## 2020-08-26 DIAGNOSIS — I5033 Acute on chronic diastolic (congestive) heart failure: Secondary | ICD-10-CM | POA: Diagnosis not present

## 2020-08-26 LAB — BASIC METABOLIC PANEL
Anion gap: 7 (ref 5–15)
BUN: 35 mg/dL — ABNORMAL HIGH (ref 6–20)
CO2: 24 mmol/L (ref 22–32)
Calcium: 8.1 mg/dL — ABNORMAL LOW (ref 8.9–10.3)
Chloride: 105 mmol/L (ref 98–111)
Creatinine, Ser: 1.95 mg/dL — ABNORMAL HIGH (ref 0.61–1.24)
GFR, Estimated: 39 mL/min — ABNORMAL LOW (ref >60)
Glucose, Bld: 168 mg/dL — ABNORMAL HIGH (ref 70–99)
Potassium: 3.5 mmol/L (ref 3.5–5.1)
Sodium: 136 mmol/L (ref 135–145)

## 2020-08-26 LAB — GLUCOSE, CAPILLARY
Glucose-Capillary: 161 mg/dL — ABNORMAL HIGH (ref 70–99)
Glucose-Capillary: 181 mg/dL — ABNORMAL HIGH (ref 70–99)
Glucose-Capillary: 194 mg/dL — ABNORMAL HIGH (ref 70–99)
Glucose-Capillary: 141 mg/dL — ABNORMAL HIGH (ref 70–99)

## 2020-08-26 MED ORDER — HYDROCODONE-ACETAMINOPHEN 5-325 MG PO TABS
1.0000 | ORAL_TABLET | Freq: Four times a day (QID) | ORAL | Status: DC | PRN
  Administered 2020-08-26 – 2020-08-27 (×2): 2 via ORAL
  Administered 2020-08-27: 1 via ORAL
  Administered 2020-08-27 – 2020-09-19 (×9): 2 via ORAL
  Filled 2020-08-26 (×3): qty 2
  Filled 2020-08-26: qty 1
  Filled 2020-08-26 (×6): qty 2
  Filled 2020-08-26: qty 1
  Filled 2020-08-26 (×2): qty 2

## 2020-08-26 NOTE — Progress Notes (Signed)
Patient refused morning medications until he eats breakfast. He has ordered breakfast and is waiting on it to be delivered.  Barbee Shropshire. Brigitte Pulse, RN

## 2020-08-26 NOTE — Progress Notes (Signed)
PROGRESS NOTE    [name]  KZS:[id number] DOB: 03-26-1960 DOA: 08/21/2020 PCP: Roy Perna, NP     Brief Narrative:  [name] is a 60 year old man PMH including diastolic CHF, diabetes, morbid obesity, stroke presented with swelling, chest pain or shortness of breath.  Admitted for acute on chronic diastolic CHF.  Massive volume overload noted, cardiology consulted, continue diuresis, monitor renal function.   New events last 24 hours / Subjective: Adamantly refusing in and out cath or Foley placement.  He had urinary retention yesterday afternoon with bladder scan revealing 750 mL in bladder.  He had continued to refuse catheter.  He did have episode of urinary incontinence this morning.  I discussed with him importance of accurately measuring in and out, continuing IV Lasix to improve his anasarca.  He continues to refuse any sort of catheter, stating that he has had it before and it was too painful.  Denies shortness of breath today.   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   Pericardial effusion   Acute on chronic diastolic CHF (congestive heart failure) (HCC)   Acute on chronic diastolic heart failure -Echocardiogram revealed EF 65 to 73%, grade 2 diastolic dysfunction -Cardiology following  -Continue Lasix, Coreg -Continue fluid restriction, daily weight and strict I's and O's  AKI on CKD stage 3A -Baseline creatinine 1.5 -Creatinine remains around 2  RUE swelling -Venous Doppler negative for DVT in right upper extremity -?Lymphedema, check CT RUE  Hypoalbuminemia  -Albumin 2.4  -Also  contributing to his anasarca  -Dietitian consulted   Pericardial effusion -No signs or symptoms of tamponade  History of CVA with residual right-sided weakness -PT OT recommending SNF placement -Continue aspirin, Lipitor  Diabetes mellitus type 2, uncontrolled with hyperglycemia -Continue Lantus, NovoLog, sliding scale insulin  Essential hypertension -Continue amlodipine, Coreg, Lasix, BiDil -Renal artery duplex unfortunately limited due to body habitus  Depression -Continue Zoloft  BPH -Continue Flomax  Microcytic anemia -Iron studies consistent with anemia of chronic disease  Diarrhea -C. difficile negative, GI PCR negative    DVT prophylaxis:  enoxaparin (LOVENOX) injection 40 mg Start: 08/22/20 1000  Code Status:     Code Status Orders  (From admission, onward)           Start     Ordered   08/22/20 0524  Full code  Continuous        08/22/20 0531           Code Status History     Date Active Date Inactive Code Status Order ID Comments User Context   06/30/2020 1243 07/05/2020 2345 Full Code [id number]  Geradine Girt, DO ED   06/25/2020 0944 06/28/2020 2024 Full Code [id number]  Norval Morton, MD ED   01/17/2020 1708 01/31/2020 1753 DNR [id number]  Rosezella Rumpf, NP Inpatient   12/31/2019 2311 01/17/2020 1708 Full Code [id number]  [name], MD ED   12/31/2017 1739 01/02/2018 1315 Full Code [id number]  [name], [name], MD Inpatient   09/28/2017 1434 10/01/2017 1948 Full Code [id number]  [name], [name], DO ED   07/30/2017 2111  08/04/2017 1746 Full Code [id number]  [name], NP Inpatient   07/29/2017 2347 07/30/2017 2033 Full Code [id number]  [name] ED   01/08/2015 2013 01/09/2015 2017 Full Code [id number]  [name], MD Inpatient   01/04/2015 1915 01/08/2015 2013 Full Code [id number]  [name], MD Inpatient   12/01/2014 1735 12/02/2014 1242 Full Code [id number]  [name], MD ED   08/28/2013 1748 08/31/2013 1627 Full  Code [id number]  [name], NP Inpatient   08/28/2013 1301 08/28/2013 1748 Full Code [id number]  [name], MD ED   05/10/2011 2141 05/12/2011 1747 Full Code [id number]  [name], PA-C ED      Family Communication: None at bedside Disposition Plan:  Status is: Inpatient  Remains inpatient appropriate because:Inpatient level of care appropriate due to severity of illness  Dispo: The patient is from: ALF              Anticipated d/c is to: SNF               Patient currently is not medically stable to d/c.   Difficult to place patient No      Antimicrobials:  Anti-infectives (From admission, onward)    None        Objective: Vitals:   08/25/20 1231 08/25/20 2017 08/26/20 0443 08/26/20 0444  BP: (!) 156/87 115/76 137/75   Pulse: 69 75 72   Resp: 19 16 16    Temp: 98.2 F (36.8 C) (!) 97.5 F (36.4 C) 98.3 F (36.8 C)   TempSrc: Oral  Oral   SpO2: 95% 97% 100%   Weight:    (!) 144.4 kg  Height:        Intake/Output Summary (Last 24 hours) at 08/26/2020 1150 Last data filed at 08/26/2020 0700 Gross per 24 hour  Intake 718 ml  Output 600 ml  Net 118 ml    Filed Weights   08/23/20 0500 08/25/20 0500 08/26/20 0444  Weight: (!) 141.7 kg (!) 141.3 kg (!) 144.4 kg    Examination: General exam: Appears calm and comfortable  Respiratory system: Clear to auscultation. Respiratory effort normal.  On room air. Cardiovascular system: S1 & S2 heard, RRR. + Anasarca Gastrointestinal system: Abdomen is nondistended, soft and nontender. Normal bowel sounds heard. Central nervous system: Alert and oriented. Non focal exam. Speech clear  Extremities: Symmetric in appearance bilaterally  Psychiatry: Mood & affect appropriate.     Data Reviewed: I have personally reviewed following labs and imaging studies  CBC: Recent Labs  Lab 08/21/20 1824  WBC 3.4*  NEUTROABS 1.8  HGB 10.4*  HCT 33.5*  MCV 79.8*  PLT 466    Basic Metabolic Panel: Recent Labs   Lab 08/22/20 0615 08/23/20 0441 08/24/20 1308 08/25/20 0813 08/26/20 0521  NA 139 139 138 136 136  K 3.0* 3.0* 3.2* 4.9 3.5  CL 107 107 107 106 105  CO2 24 24 24 22 24   GLUCOSE 154* 130* 146* 124* 168*  BUN 32* 31* 33* 33* 35*  CREATININE 2.04* 1.87* 2.13* 2.11* 1.95*  CALCIUM 8.2* 8.1* 8.0* 7.9* 8.1*    GFR: Estimated Creatinine Clearance: 57 mL/min (A) (by C-G formula based on SCr of 1.95 mg/dL (H)). Liver Function Tests: Recent Labs  Lab 08/21/20 1646  AST 18  ALT 14  ALKPHOS 88  BILITOT 0.6  PROT 6.6  ALBUMIN 2.4*    No results for input(s): LIPASE, AMYLASE in the last 168 hours. No results for input(s):  AMMONIA in the last 168 hours. Coagulation Profile: No results for input(s): INR, PROTIME in the last 168 hours. Cardiac Enzymes: No results for input(s): CKTOTAL, CKMB, CKMBINDEX, TROPONINI in the last 168 hours. BNP (last 3 results) No results for input(s): PROBNP in the last 8760 hours. HbA1C: No results for input(s): HGBA1C in the last 72 hours. CBG: Recent Labs  Lab 08/25/20 0811 08/25/20 1125 08/25/20 1626 08/25/20 2015 08/26/20 0736  GLUCAP 127* 114* 164* 135* 161*    Lipid Profile: No results for input(s): CHOL, HDL, LDLCALC, TRIG, CHOLHDL, LDLDIRECT in the last 72 hours. Thyroid Function Tests: No results for input(s): TSH, T4TOTAL, FREET4, T3FREE, THYROIDAB in the last 72 hours. Anemia Panel: No results for input(s): VITAMINB12, FOLATE, FERRITIN, TIBC, IRON, RETICCTPCT in the last 72 hours.  Sepsis Labs: No results for input(s): PROCALCITON, LATICACIDVEN in the last 168 hours.  Recent Results (from the past 240 hour(s))  Resp Panel by RT-PCR (Flu A&B, Covid) Nasopharyngeal Swab     Status: None   Collection Time: 08/21/20 11:31 PM   Specimen: Nasopharyngeal Swab; Nasopharyngeal(NP) swabs in vial transport medium  Result Value Ref Range Status   SARS Coronavirus 2 by RT PCR NEGATIVE NEGATIVE Final    Comment: (NOTE) SARS-CoV-2 target  nucleic acids are NOT DETECTED.  The SARS-CoV-2 RNA is generally detectable in upper respiratory specimens during the acute phase of infection. The lowest concentration of SARS-CoV-2 viral copies this assay can detect is 138 copies/mL. A negative result does not preclude SARS-Cov-2 infection and should not be used as the sole basis for treatment or other patient management decisions. A negative result may occur with  improper specimen collection/handling, submission of specimen other than nasopharyngeal swab, presence of viral mutation(s) within the areas targeted by this assay, and inadequate number of viral copies(<138 copies/mL). A negative result must be combined with clinical observations, patient history, and epidemiological information. The expected result is Negative.  Fact Sheet for Patients:  EntrepreneurPulse.com.au  Fact Sheet for Healthcare Providers:  IncredibleEmployment.be  This test is no t yet approved or cleared by the [place] FDA and  has been authorized for detection and/or diagnosis of SARS-CoV-2 by FDA under an Emergency Use Authorization (EUA). This EUA will remain  in effect (meaning this test can be used) for the duration of the COVID-19 declaration under Section 564(b)(1) of the Act, 21 U.S.C.section 360bbb-3(b)(1), unless the authorization is terminated  or revoked sooner.       Influenza A by PCR NEGATIVE NEGATIVE Final   Influenza B by PCR NEGATIVE NEGATIVE Final    Comment: (NOTE) The Xpert Xpress SARS-CoV-2/FLU/RSV plus assay is intended as an aid in the diagnosis of influenza from Nasopharyngeal swab specimens and should not be used as a sole basis for treatment. Nasal washings and aspirates are unacceptable for Xpert Xpress SARS-CoV-2/FLU/RSV testing.  Fact Sheet for Patients: EntrepreneurPulse.com.au  Fact Sheet for Healthcare  Providers: IncredibleEmployment.be  This test is not yet approved or cleared by the [place] FDA and has been authorized for detection and/or diagnosis of SARS-CoV-2 by FDA under an Emergency Use Authorization (EUA). This EUA will remain in effect (meaning this test can be used) for the duration of the COVID-19 declaration under Section 564(b)(1) of the Act, 21 U.S.C. section 360bbb-3(b)(1), unless the authorization is terminated or revoked.  Performed at [facility], [place] [address], [place], [place] [address]   MRSA PCR Screening     Status: None   Collection Time: 08/22/20  5:39  PM  Result Value Ref Range Status   MRSA by PCR NEGATIVE NEGATIVE Final    Comment:        The GeneXpert MRSA Assay (FDA approved for NASAL specimens only), is one component of a comprehensive MRSA colonization surveillance program. It is not intended to diagnose MRSA infection nor to guide or monitor treatment for MRSA infections. Performed at [facility], [place] [address] [place], [place] [address]   C Difficile Quick Screen w PCR reflex     Status: None   Collection Time: 08/23/20  4:57 PM   Specimen: STOOL  Result Value Ref Range Status   C Diff antigen NEGATIVE NEGATIVE Final   C Diff toxin NEGATIVE NEGATIVE Final   C Diff interpretation No C. difficile detected.  Final    Comment: Performed at [facility], [place] [address] [place], [place] [address]  Gastrointestinal Panel by PCR , Stool     Status: None   Collection Time: 08/23/20  4:58 PM   Specimen: STOOL  Result Value Ref Range Status   Campylobacter species NOT DETECTED NOT DETECTED Final   Plesimonas shigelloides NOT DETECTED NOT DETECTED Final   Salmonella species NOT DETECTED NOT DETECTED Final   Yersinia enterocolitica NOT DETECTED NOT DETECTED Final   Vibrio species NOT DETECTED NOT DETECTED Final   Vibrio cholerae NOT DETECTED NOT DETECTED Final    Enteroaggregative E coli (EAEC) NOT DETECTED NOT DETECTED Final   Enteropathogenic E coli (EPEC) NOT DETECTED NOT DETECTED Final   Enterotoxigenic E coli (ETEC) NOT DETECTED NOT DETECTED Final   Shiga like toxin producing E coli (STEC) NOT DETECTED NOT DETECTED Final   Shigella/Enteroinvasive E coli (EIEC) NOT DETECTED NOT DETECTED Final   Cryptosporidium NOT DETECTED NOT DETECTED Final   Cyclospora cayetanensis NOT DETECTED NOT DETECTED Final   Entamoeba histolytica NOT DETECTED NOT DETECTED Final   Giardia lamblia NOT DETECTED NOT DETECTED Final   Adenovirus F40/41 NOT DETECTED NOT DETECTED Final   Astrovirus NOT DETECTED NOT DETECTED Final   Norovirus GI/GII NOT DETECTED NOT DETECTED Final   Rotavirus A NOT DETECTED NOT DETECTED Final   Sapovirus (I, II, IV, and V) NOT DETECTED NOT DETECTED Final    Comment: Performed at [facility], [address], [place], [place] [address]       Radiology Studies: No results found.    Scheduled Meds:  amLODipine  10 mg Oral Daily   aspirin  81 mg Oral Daily   atorvastatin  80 mg Oral QHS   carvedilol  12.5 mg Oral BID WC   enoxaparin (LOVENOX) injection  40 mg Subcutaneous Q24H   furosemide  80 mg Intravenous BID   insulin aspart  0-9 Units Subcutaneous TID WC   insulin aspart  3 Units Subcutaneous TID WC   insulin glargine  20 Units Subcutaneous QHS   isosorbide-hydrALAZINE  1 tablet Oral TID   pantoprazole  40 mg Oral Daily   pregabalin  75 mg Oral BID   saccharomyces boulardii  250 mg Oral BID   sertraline  50 mg Oral Daily   sodium chloride flush  3 mL Intravenous Q12H   tamsulosin  0.8 mg Oral Daily   Continuous Infusions:  sodium chloride       LOS: 4 days      Time spent: 25 minutes   Dessa Phi, DO [facility] 08/26/2020, 11:50 AM   Available via Epic secure chat 7am-7pm After these hours, please refer to coverage  provider listed on [email or web address]

## 2020-08-27 DIAGNOSIS — I5033 Acute on chronic diastolic (congestive) heart failure: Secondary | ICD-10-CM | POA: Diagnosis not present

## 2020-08-27 DIAGNOSIS — I10 Essential (primary) hypertension: Secondary | ICD-10-CM | POA: Diagnosis not present

## 2020-08-27 LAB — GLUCOSE, CAPILLARY
Glucose-Capillary: 110 mg/dL — ABNORMAL HIGH (ref 70–99)
Glucose-Capillary: 122 mg/dL — ABNORMAL HIGH (ref 70–99)
Glucose-Capillary: 139 mg/dL — ABNORMAL HIGH (ref 70–99)
Glucose-Capillary: 130 mg/dL — ABNORMAL HIGH (ref 70–99)

## 2020-08-27 LAB — BASIC METABOLIC PANEL
Anion gap: 6 (ref 5–15)
BUN: 36 mg/dL — ABNORMAL HIGH (ref 6–20)
CO2: 25 mmol/L (ref 22–32)
Calcium: 8.2 mg/dL — ABNORMAL LOW (ref 8.9–10.3)
Chloride: 105 mmol/L (ref 98–111)
Creatinine, Ser: 1.93 mg/dL — ABNORMAL HIGH (ref 0.61–1.24)
GFR, Estimated: 39 mL/min — ABNORMAL LOW (ref >60)
Glucose, Bld: 114 mg/dL — ABNORMAL HIGH (ref 70–99)
Potassium: 3.6 mmol/L (ref 3.5–5.1)
Sodium: 136 mmol/L (ref 135–145)

## 2020-08-27 NOTE — Progress Notes (Signed)
PROGRESS NOTE    [name]  ELF:[id number] DOB: 29-May-1960 DOA: 08/21/2020 PCP: [name], NP     Brief Narrative:  [name] is a 60 year old man PMH including diastolic CHF, diabetes, morbid obesity, stroke presented with swelling, chest pain or shortness of breath.  Admitted for acute on chronic diastolic CHF.  Massive volume overload noted, cardiology consulted, continue diuresis, monitor renal function.   New events last 24 hours / Subjective: No new complaints today.  Due to continuous refusal of Foley catheter, I's and O's are inaccurately documented.  Continues to have gross volume overload.  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   Pericardial effusion   Acute on chronic diastolic CHF (congestive heart failure) (HCC)   Acute on chronic diastolic heart failure -Echocardiogram revealed EF 65 to 58%, grade 2 diastolic dysfunction -Cardiology following  -Continue Lasix, Coreg -Continue fluid restriction, daily weight and strict I's and O's  AKI on CKD stage 3A -Baseline creatinine 1.5 -Creatinine remains around 2  RUE swelling -Venous Doppler negative for DVT in right upper extremity -?Lymphedema, check CT RUE  Hypoalbuminemia  -Albumin 2.4  -Also contributing to his anasarca  -Dietitian consulted   Pericardial effusion -No signs or symptoms of tamponade  History of CVA with residual right-sided weakness -PT OT recommending SNF placement -Continue aspirin, Lipitor  Diabetes mellitus type 2, uncontrolled with hyperglycemia -Continue Lantus, NovoLog, sliding scale  insulin  Essential hypertension -Continue amlodipine, Coreg, Lasix, BiDil -Renal artery duplex unfortunately limited due to body habitus  Depression -Continue Zoloft  BPH -Continue Flomax  Microcytic anemia -Iron studies consistent with anemia of chronic disease  Diarrhea -C. difficile negative, GI PCR negative    DVT prophylaxis:  enoxaparin (LOVENOX) injection 40 mg Start: 08/22/20 1000  Code Status: Full code   Family Communication: None at bedside Disposition Plan:  Status is: Inpatient  Remains inpatient appropriate because:Inpatient level of care appropriate due to severity of illness  Dispo: The patient is from: ALF              Anticipated d/c is to: SNF               Patient currently is not medically stable to d/c.   Difficult to place patient No      Antimicrobials:  Anti-infectives (From admission, onward)    None        Objective: Vitals:   08/26/20 0444 08/26/20 1352 08/26/20 2059 08/27/20 0536  BP:  (!) 147/68 116/70 (!) 143/86  Pulse:  70 73 72  Resp:  18 16 18   Temp:  98 F (36.7 C) 98 F (36.7 C) 98.5 F (36.9 C)  TempSrc:  Oral Oral Oral  SpO2:  100% 100% 99%  Weight: (!) 144.4 kg     Height:        Intake/Output Summary (Last 24 hours) at 08/27/2020 1020 Last data filed at 08/26/2020 1358 Gross per 24 hour  Intake --  Output 700 ml  Net -700 ml    Filed Weights   08/23/20 0500 08/25/20 0500 08/26/20 0444  Weight: (!) 141.7 kg Marland Kitchen)  141.3 kg (!) 144.4 kg    Examination: General exam: Appears calm and comfortable  Respiratory system: Clear to auscultation. Respiratory effort normal.  On room air Cardiovascular system: S1 & S2 heard, RRR. + Anasarca Gastrointestinal system: Abdomen is nondistended, soft and nontender. Normal bowel sounds heard. Central nervous system: Alert and oriented. Non focal exam. Speech clear  Extremities: Symmetric in appearance bilaterally  Psychiatry: Judgement and insight appear stable. Mood &  affect appropriate.     Data Reviewed: I have personally reviewed following labs and imaging studies  CBC: Recent Labs  Lab 08/21/20 1824  WBC 3.4*  NEUTROABS 1.8  HGB 10.4*  HCT 33.5*  MCV 79.8*  PLT 267    Basic Metabolic Panel: Recent Labs  Lab 08/23/20 0441 08/24/20 1308 08/25/20 0813 08/26/20 0521 08/27/20 0408  NA 139 138 136 136 136  K 3.0* 3.2* 4.9 3.5 3.6  CL 107 107 106 105 105  CO2 24 24 22 24 25   GLUCOSE 130* 146* 124* 168* 114*  BUN 31* 33* 33* 35* 36*  CREATININE 1.87* 2.13* 2.11* 1.95* 1.93*  CALCIUM 8.1* 8.0* 7.9* 8.1* 8.2*    GFR: Estimated Creatinine Clearance: 57.6 mL/min (A) (by C-G formula based on SCr of 1.93 mg/dL (H)). Liver Function Tests: Recent Labs  Lab 08/21/20 1646  AST 18  ALT 14  ALKPHOS 88  BILITOT 0.6  PROT 6.6  ALBUMIN 2.4*    No results for input(s): LIPASE, AMYLASE in the last 168 hours. No results for input(s): AMMONIA in the last 168 hours. Coagulation Profile: No results for input(s): INR, PROTIME in the last 168 hours. Cardiac Enzymes: No results for input(s): CKTOTAL, CKMB, CKMBINDEX, TROPONINI in the last 168 hours. BNP (last 3 results) No results for input(s): PROBNP in the last 8760 hours. HbA1C: No results for input(s): HGBA1C in the last 72 hours. CBG: Recent Labs  Lab 08/26/20 0736 08/26/20 1149 08/26/20 1654 08/26/20 2200 08/27/20 0741  GLUCAP 161* 181* 194* 141* 110*    Lipid Profile: No results for input(s): CHOL, HDL, LDLCALC, TRIG, CHOLHDL, LDLDIRECT in the last 72 hours. Thyroid Function Tests: No results for input(s): TSH, T4TOTAL, FREET4, T3FREE, THYROIDAB in the last 72 hours. Anemia Panel: No results for input(s): VITAMINB12, FOLATE, FERRITIN, TIBC, IRON, RETICCTPCT in the last 72 hours.  Sepsis Labs: No results for input(s): PROCALCITON, LATICACIDVEN in the last 168 hours.  Recent Results (from the past 240 hour(s))  Resp Panel by RT-PCR (Flu A&B, Covid) Nasopharyngeal Swab      Status: None   Collection Time: 08/21/20 11:31 PM   Specimen: Nasopharyngeal Swab; Nasopharyngeal(NP) swabs in vial transport medium  Result Value Ref Range Status   SARS Coronavirus 2 by RT PCR NEGATIVE NEGATIVE Final    Comment: (NOTE) SARS-CoV-2 target nucleic acids are NOT DETECTED.  The SARS-CoV-2 RNA is generally detectable in upper respiratory specimens during the acute phase of infection. The lowest concentration of SARS-CoV-2 viral copies this assay can detect is 138 copies/mL. A negative result does not preclude SARS-Cov-2 infection and should not be used as the sole basis for treatment or other patient management decisions. A negative result may occur with  improper specimen collection/handling, submission of specimen other than nasopharyngeal swab, presence of viral mutation(s) within the areas targeted by this assay, and inadequate number of viral copies(<138 copies/mL). A negative result must be combined with clinical observations, patient history, and epidemiological information. The expected result is Negative.  Fact Sheet for Patients:  EntrepreneurPulse.com.au  Fact Sheet  for Healthcare Providers:  IncredibleEmployment.be  This test is no t yet approved or cleared by the [place] and  has been authorized for detection and/or diagnosis of SARS-CoV-2 by FDA under an Emergency Use Authorization (EUA). This EUA will remain  in effect (meaning this test can be used) for the duration of the COVID-19 declaration under Section 564(b)(1) of the Act, 21 U.S.C.section 360bbb-3(b)(1), unless the authorization is terminated  or revoked sooner.       Influenza A by PCR NEGATIVE NEGATIVE Final   Influenza B by PCR NEGATIVE NEGATIVE Final    Comment: (NOTE) The Xpert Xpress SARS-CoV-2/FLU/RSV plus assay is intended as an aid in the diagnosis of influenza from Nasopharyngeal swab specimens and should not be used as a sole basis  for treatment. Nasal washings and aspirates are unacceptable for Xpert Xpress SARS-CoV-2/FLU/RSV testing.  Fact Sheet for Patients: EntrepreneurPulse.com.au  Fact Sheet for Healthcare Providers: IncredibleEmployment.be  This test is not yet approved or cleared by the [place] FDA and has been authorized for detection and/or diagnosis of SARS-CoV-2 by FDA under an Emergency Use Authorization (EUA). This EUA will remain in effect (meaning this test can be used) for the duration of the COVID-19 declaration under Section 564(b)(1) of the Act, 21 U.S.C. section 360bbb-3(b)(1), unless the authorization is terminated or revoked.  Performed at [facility], [place] [address] [place], [place] [address]   MRSA PCR Screening     Status: None   Collection Time: 08/22/20  5:39 PM  Result Value Ref Range Status   MRSA by PCR NEGATIVE NEGATIVE Final    Comment:        The GeneXpert MRSA Assay (FDA approved for NASAL specimens only), is one component of a comprehensive MRSA colonization surveillance program. It is not intended to diagnose MRSA infection nor to guide or monitor treatment for MRSA infections. Performed at [facility], [place] [address] [place], [place] [address]   C Difficile Quick Screen w PCR reflex     Status: None   Collection Time: 08/23/20  4:57 PM   Specimen: STOOL  Result Value Ref Range Status   C Diff antigen NEGATIVE NEGATIVE Final   C Diff toxin NEGATIVE NEGATIVE Final   C Diff interpretation No C. difficile detected.  Final    Comment: Performed at [facility], [place] [address] [place], [place] [address]  Gastrointestinal Panel by PCR , Stool     Status: None   Collection Time: 08/23/20  4:58 PM   Specimen: STOOL  Result Value Ref Range Status   Campylobacter species NOT DETECTED NOT DETECTED Final   Plesimonas shigelloides NOT DETECTED NOT DETECTED Final    Salmonella species NOT DETECTED NOT DETECTED Final   Yersinia enterocolitica NOT DETECTED NOT DETECTED Final   Vibrio species NOT DETECTED NOT DETECTED Final   Vibrio cholerae NOT DETECTED NOT DETECTED Final   Enteroaggregative E coli (EAEC) NOT DETECTED NOT DETECTED Final   Enteropathogenic E coli (EPEC) NOT DETECTED NOT DETECTED Final   Enterotoxigenic E coli (ETEC) NOT DETECTED NOT DETECTED Final   Shiga like toxin producing E coli (STEC) NOT DETECTED NOT DETECTED Final   Shigella/Enteroinvasive E coli (EIEC) NOT DETECTED NOT DETECTED Final   Cryptosporidium NOT DETECTED NOT DETECTED Final   Cyclospora cayetanensis NOT DETECTED NOT DETECTED Final   Entamoeba histolytica NOT DETECTED NOT DETECTED Final   Giardia lamblia NOT DETECTED NOT DETECTED Final   Adenovirus F40/41 NOT DETECTED NOT DETECTED  Final   Astrovirus NOT DETECTED NOT DETECTED Final   Norovirus GI/GII NOT DETECTED NOT DETECTED Final   Rotavirus A NOT DETECTED NOT DETECTED Final   Sapovirus (I, II, IV, and V) NOT DETECTED NOT DETECTED Final    Comment: Performed at [facility], [address] [place], [place] [address]       Radiology Studies: No results found.    Scheduled Meds:  amLODipine  10 mg Oral Daily   aspirin  81 mg Oral Daily   atorvastatin  80 mg Oral QHS   carvedilol  12.5 mg Oral BID WC   enoxaparin (LOVENOX) injection  40 mg Subcutaneous Q24H   furosemide  80 mg Intravenous BID   insulin aspart  0-9 Units Subcutaneous TID WC   insulin aspart  3 Units Subcutaneous TID WC   insulin glargine  20 Units Subcutaneous QHS   isosorbide-hydrALAZINE  1 tablet Oral TID   pantoprazole  40 mg Oral Daily   pregabalin  75 mg Oral BID   saccharomyces boulardii  250 mg Oral BID   sertraline  50 mg Oral Daily   sodium chloride flush  3 mL Intravenous Q12H   tamsulosin  0.8 mg Oral Daily   Continuous Infusions:  sodium chloride       LOS: 5 days      Time spent: 25 minutes   Dessa Phi, DO [facility] 08/27/2020, 10:20 AM   Available via Epic secure chat 7am-7pm After these hours, please refer to coverage provider listed on [email or web address]

## 2020-08-28 DIAGNOSIS — I5033 Acute on chronic diastolic (congestive) heart failure: Secondary | ICD-10-CM | POA: Diagnosis not present

## 2020-08-28 LAB — GLUCOSE, CAPILLARY
Glucose-Capillary: 99 mg/dL (ref 70–99)
Glucose-Capillary: 114 mg/dL — ABNORMAL HIGH (ref 70–99)
Glucose-Capillary: 161 mg/dL — ABNORMAL HIGH (ref 70–99)
Glucose-Capillary: 156 mg/dL — ABNORMAL HIGH (ref 70–99)

## 2020-08-28 LAB — BASIC METABOLIC PANEL
Anion gap: 6 (ref 5–15)
BUN: 40 mg/dL — ABNORMAL HIGH (ref 6–20)
CO2: 26 mmol/L (ref 22–32)
Calcium: 8.3 mg/dL — ABNORMAL LOW (ref 8.9–10.3)
Chloride: 105 mmol/L (ref 98–111)
Creatinine, Ser: 1.97 mg/dL — ABNORMAL HIGH (ref 0.61–1.24)
GFR, Estimated: 38 mL/min — ABNORMAL LOW (ref >60)
Glucose, Bld: 116 mg/dL — ABNORMAL HIGH (ref 70–99)
Potassium: 3.4 mmol/L — ABNORMAL LOW (ref 3.5–5.1)
Sodium: 137 mmol/L (ref 135–145)

## 2020-08-28 MED ORDER — POTASSIUM CHLORIDE CRYS ER 20 MEQ PO TBCR
40.0000 meq | EXTENDED_RELEASE_TABLET | Freq: Once | ORAL | Status: AC
  Administered 2020-08-28: 40 meq via ORAL
  Filled 2020-08-28: qty 2

## 2020-08-28 NOTE — Progress Notes (Signed)
PROGRESS NOTE    [name]  SEG:[id number] DOB: [birth year] DOA: 08/21/2020 PCP: [name], NP     Brief Narrative:  [name] is a 60 year old man PMH including diastolic CHF, diabetes, morbid obesity, stroke presented with swelling, chest pain or shortness of breath.  Admitted for acute on chronic diastolic CHF.  Massive volume overload noted, cardiology consulted, continue diuresis, monitor renal function.   New events last 24 hours / Subjective: No new issues, continues to be grossly volume overloaded.  In and out has been better documented last 24 hours, 2150 mL output yesterday, 750 mL so far this morning.  States that he will not do CT of his arm unless he is under anesthesia.  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   Pericardial effusion   Acute on chronic diastolic CHF (congestive heart failure) (HCC)   Acute on chronic diastolic heart failure -Echocardiogram revealed EF 65 to 73%, grade 2 diastolic dysfunction -Cardiology following  -Continue Lasix, Coreg -Continue fluid restriction, daily weight and strict I's and O's  AKI on CKD stage 3A -Baseline creatinine 1.5 -Creatinine remains around 2  RUE swelling -Venous Doppler negative for DVT in right upper extremity -Refusing CT  Hypoalbuminemia  -Albumin 2.4  -Also contributing to his anasarca  -Dietitian consulted   Pericardial effusion -No signs or symptoms of tamponade  History of CVA with residual right-sided weakness -PT OT recommending SNF placement -Continue aspirin, Lipitor  Diabetes mellitus type 2, uncontrolled with  hyperglycemia -Continue Lantus, NovoLog, sliding scale insulin  Essential hypertension -Continue amlodipine, Coreg, Lasix, BiDil -Renal artery duplex unfortunately limited due to body habitus  Depression -Continue Zoloft  BPH -Continue Flomax  Microcytic anemia -Iron studies consistent with anemia of chronic disease  Diarrhea -C. difficile negative, GI PCR negative   Hypokalemia -Replace, trend   DVT prophylaxis:  enoxaparin (LOVENOX) injection 40 mg Start: 08/22/20 1000  Code Status: Full code   Family Communication: None at bedside Disposition Plan:  Status is: Inpatient  Remains inpatient appropriate because:Inpatient level of care appropriate due to severity of illness  Dispo: The patient is from: ALF              Anticipated d/c is to: SNF               Patient currently is not medically stable to d/c.   Difficult to place patient No      Antimicrobials:  Anti-infectives (From admission, onward)    None        Objective: Vitals:   08/27/20 2030 08/28/20 0437 08/28/20 1023 08/28/20 1203  BP: 118/80 124/81 128/77 130/79  Pulse: 73 76 75 75  Resp: 18 20 18 13   Temp: (!) 97.5 F (36.4 C) 97.9 F (36.6 C)  98 F (36.7 C)  TempSrc: Oral Oral    SpO2: 96% 94% 100% 98%  Weight:      Height:        Intake/Output Summary (Last 24 hours) at 08/28/2020 1236 Last data filed at 08/28/2020 1212 Gross per 24 hour  Intake 420 ml  Output 2900 ml  Net -2480 ml    Filed Weights   08/23/20 0500 08/25/20 0500 08/26/20 0444  Weight: (!) 141.7 kg (!) 141.3 kg (!) 144.4 kg   Examination: General exam: Appears calm and comfortable  Respiratory system: Clear to auscultation. Respiratory effort normal.  On room air Cardiovascular system: S1 & S2 heard, RRR.  Anasarca Gastrointestinal system: Abdomen is nondistended, soft and nontender. Normal bowel sounds heard. Central nervous system: Alert and oriented. Non focal exam. Speech clear  Extremities: Symmetric  in appearance bilaterally  Skin: No rashes, lesions or ulcers on exposed skin  Psychiatry: Judgement and insight appear poor. Mood & affect appropriate.    Data Reviewed: I have personally reviewed following labs and imaging studies  CBC: Recent Labs  Lab 08/21/20 1824  WBC 3.4*  NEUTROABS 1.8  HGB 10.4*  HCT 33.5*  MCV 79.8*  PLT 202    Basic Metabolic Panel: Recent Labs  Lab 08/24/20 1308 08/25/20 0813 08/26/20 0521 08/27/20 0408 08/28/20 0504  NA 138 136 136 136 137  K 3.2* 4.9 3.5 3.6 3.4*  CL 107 106 105 105 105  CO2 24 22 24 25 26   GLUCOSE 146* 124* 168* 114* 116*  BUN 33* 33* 35* 36* 40*  CREATININE 2.13* 2.11* 1.95* 1.93* 1.97*  CALCIUM 8.0* 7.9* 8.1* 8.2* 8.3*    GFR: Estimated Creatinine Clearance: 56.4 mL/min (A) (by C-G formula based on SCr of 1.97 mg/dL (H)). Liver Function Tests: Recent Labs  Lab 08/21/20 1646  AST 18  ALT 14  ALKPHOS 88  BILITOT 0.6  PROT 6.6  ALBUMIN 2.4*    No results for input(s): LIPASE, AMYLASE in the last 168 hours. No results for input(s): AMMONIA in the last 168 hours. Coagulation Profile: No results for input(s): INR, PROTIME in the last 168 hours. Cardiac Enzymes: No results for input(s): CKTOTAL, CKMB, CKMBINDEX, TROPONINI in the last 168 hours. BNP (last 3 results) No results for input(s): PROBNP in the last 8760 hours. HbA1C: No results for input(s): HGBA1C in the last 72 hours. CBG: Recent Labs  Lab 08/27/20 1201 08/27/20 1616 08/27/20 2144 08/28/20 0733 08/28/20 1138  GLUCAP 122* 139* 130* 99 114*    Lipid Profile: No results for input(s): CHOL, HDL, LDLCALC, TRIG, CHOLHDL, LDLDIRECT in the last 72 hours. Thyroid Function Tests: No results for input(s): TSH, T4TOTAL, FREET4, T3FREE, THYROIDAB in the last 72 hours. Anemia Panel: No results for input(s): VITAMINB12, FOLATE, FERRITIN, TIBC, IRON, RETICCTPCT in the last 72 hours.  Sepsis Labs: No results for input(s): PROCALCITON, LATICACIDVEN  in the last 168 hours.  Recent Results (from the past 240 hour(s))  Resp Panel by RT-PCR (Flu A&B, Covid) Nasopharyngeal Swab     Status: None   Collection Time: 08/21/20 11:31 PM   Specimen: Nasopharyngeal Swab; Nasopharyngeal(NP) swabs in vial transport medium  Result Value Ref Range Status   SARS Coronavirus 2 by RT PCR NEGATIVE NEGATIVE Final    Comment: (NOTE) SARS-CoV-2 target nucleic acids are NOT DETECTED.  The SARS-CoV-2 RNA is generally detectable in upper respiratory specimens during the acute phase of infection. The lowest concentration of SARS-CoV-2 viral copies this assay can detect is 138 copies/mL. A negative result does not preclude SARS-Cov-2 infection and should not be used as the sole basis for treatment or other patient management decisions. A negative result may occur with  improper specimen collection/handling, submission of specimen other than nasopharyngeal swab, presence of viral mutation(s) within the areas targeted by this assay, and inadequate number of viral copies(<138  copies/mL). A negative result must be combined with clinical observations, patient history, and epidemiological information. The expected result is Negative.  Fact Sheet for Patients:  EntrepreneurPulse.com.au  Fact Sheet for Healthcare Providers:  IncredibleEmployment.be  This test is no t yet approved or cleared by the [place] FDA and  has been authorized for detection and/or diagnosis of SARS-CoV-2 by FDA under an Emergency Use Authorization (EUA). This EUA will remain  in effect (meaning this test can be used) for the duration of the COVID-19 declaration under Section 564(b)(1) of the Act, 21 U.S.C.section 360bbb-3(b)(1), unless the authorization is terminated  or revoked sooner.       Influenza A by PCR NEGATIVE NEGATIVE Final   Influenza B by PCR NEGATIVE NEGATIVE Final    Comment: (NOTE) The Xpert Xpress SARS-CoV-2/FLU/RSV plus  assay is intended as an aid in the diagnosis of influenza from Nasopharyngeal swab specimens and should not be used as a sole basis for treatment. Nasal washings and aspirates are unacceptable for Xpert Xpress SARS-CoV-2/FLU/RSV testing.  Fact Sheet for Patients: EntrepreneurPulse.com.au  Fact Sheet for Healthcare Providers: IncredibleEmployment.be  This test is not yet approved or cleared by the [place] FDA and has been authorized for detection and/or diagnosis of SARS-CoV-2 by FDA under an Emergency Use Authorization (EUA). This EUA will remain in effect (meaning this test can be used) for the duration of the COVID-19 declaration under Section 564(b)(1) of the Act, 21 U.S.C. section 360bbb-3(b)(1), unless the authorization is terminated or revoked.  Performed at [facility], [place] [address], [place], [place] [address]   MRSA PCR Screening     Status: None   Collection Time: 08/22/20  5:39 PM  Result Value Ref Range Status   MRSA by PCR NEGATIVE NEGATIVE Final    Comment:        The GeneXpert MRSA Assay (FDA approved for NASAL specimens only), is one component of a comprehensive MRSA colonization surveillance program. It is not intended to diagnose MRSA infection nor to guide or monitor treatment for MRSA infections. Performed at [facility], [place] [address], [place], [place] [address]   C Difficile Quick Screen w PCR reflex     Status: None   Collection Time: 08/23/20  4:57 PM   Specimen: STOOL  Result Value Ref Range Status   C Diff antigen NEGATIVE NEGATIVE Final   C Diff toxin NEGATIVE NEGATIVE Final   C Diff interpretation No C. difficile detected.  Final    Comment: Performed at [facility], [place] [address], [place], [place] [address]  Gastrointestinal Panel by PCR , Stool     Status: None   Collection Time: 08/23/20  4:58 PM   Specimen: STOOL  Result Value Ref  Range Status   Campylobacter species NOT DETECTED NOT DETECTED Final   Plesimonas shigelloides NOT DETECTED NOT DETECTED Final   Salmonella species NOT DETECTED NOT DETECTED Final   Yersinia enterocolitica NOT DETECTED NOT DETECTED Final   Vibrio species NOT DETECTED NOT DETECTED Final   Vibrio cholerae NOT DETECTED NOT DETECTED Final   Enteroaggregative E coli (EAEC) NOT DETECTED NOT DETECTED Final   Enteropathogenic E coli (EPEC) NOT DETECTED NOT DETECTED Final   Enterotoxigenic E coli (ETEC) NOT DETECTED NOT DETECTED Final   Shiga like toxin producing E coli (STEC) NOT DETECTED NOT DETECTED Final   Shigella/Enteroinvasive E coli (EIEC) NOT DETECTED NOT DETECTED Final   Cryptosporidium NOT DETECTED NOT DETECTED Final   Cyclospora cayetanensis NOT  DETECTED NOT DETECTED Final   Entamoeba histolytica NOT DETECTED NOT DETECTED Final   Giardia lamblia NOT DETECTED NOT DETECTED Final   Adenovirus F40/41 NOT DETECTED NOT DETECTED Final   Astrovirus NOT DETECTED NOT DETECTED Final   Norovirus GI/GII NOT DETECTED NOT DETECTED Final   Rotavirus A NOT DETECTED NOT DETECTED Final   Sapovirus (I, II, IV, and V) NOT DETECTED NOT DETECTED Final    Comment: Performed at [facility], [address] [place], [place] [address]       Radiology Studies: No results found.    Scheduled Meds:  amLODipine  10 mg Oral Daily   aspirin  81 mg Oral Daily   atorvastatin  80 mg Oral QHS   carvedilol  12.5 mg Oral BID WC   enoxaparin (LOVENOX) injection  40 mg Subcutaneous Q24H   furosemide  80 mg Intravenous BID   insulin aspart  0-9 Units Subcutaneous TID WC   insulin aspart  3 Units Subcutaneous TID WC   insulin glargine  20 Units Subcutaneous QHS   isosorbide-hydrALAZINE  1 tablet Oral TID   pantoprazole  40 mg Oral Daily   pregabalin  75 mg Oral BID   saccharomyces boulardii  250 mg Oral BID   sertraline  50 mg Oral Daily   sodium chloride flush  3 mL Intravenous Q12H   tamsulosin   0.8 mg Oral Daily   Continuous Infusions:  sodium chloride       LOS: 6 days      Time spent: 25 minutes   Dessa Phi, DO [facility] 08/28/2020, 12:36 PM   Available via Epic secure chat 7am-7pm After these hours, please refer to coverage provider listed on [email or web address]

## 2020-08-28 NOTE — Care Management Important Message (Signed)
Important Message  Patient Details IM Letter given to the Patient. Name: [name] MRN: [id number] Date of Birth: Aug 11, 1960   Medicare Important Message Given:  Yes     Kerin Salen 08/28/2020, 11:28 AM

## 2020-08-28 NOTE — Progress Notes (Signed)
Patient refused to take zoloft, Pt request to have medication removed from medication list.

## 2020-08-28 NOTE — Progress Notes (Signed)
PT Cancellation Note  Patient Details Name: [name] MRN: [id number] DOB: 1961-02-13   Cancelled Treatment:    Reason Eval/Treat Not Completed: Patient declined, no reason specified Pt refused to mobilize and states he would like to know when therapy is coming.  Pt was asked how much notice he needs, and he talked in a circle about just letting him know we are coming but did not provide any helpful information.   Gerrie Castiglia,KATHrine E 08/28/2020, 1:16 PM Arlyce Dice, DPT Acute Rehabilitation Services Pager: [phone] Office: [phone]

## 2020-08-29 ENCOUNTER — Inpatient Hospital Stay (HOSPITAL_COMMUNITY): Payer: Medicare HMO

## 2020-08-29 DIAGNOSIS — I5033 Acute on chronic diastolic (congestive) heart failure: Secondary | ICD-10-CM | POA: Diagnosis not present

## 2020-08-29 LAB — GLUCOSE, CAPILLARY
Glucose-Capillary: 149 mg/dL — ABNORMAL HIGH (ref 70–99)
Glucose-Capillary: 145 mg/dL — ABNORMAL HIGH (ref 70–99)
Glucose-Capillary: 115 mg/dL — ABNORMAL HIGH (ref 70–99)
Glucose-Capillary: 207 mg/dL — ABNORMAL HIGH (ref 70–99)

## 2020-08-29 LAB — BASIC METABOLIC PANEL
Anion gap: 7 (ref 5–15)
BUN: 43 mg/dL — ABNORMAL HIGH (ref 6–20)
CO2: 24 mmol/L (ref 22–32)
Calcium: 8.3 mg/dL — ABNORMAL LOW (ref 8.9–10.3)
Chloride: 104 mmol/L (ref 98–111)
Creatinine, Ser: 1.94 mg/dL — ABNORMAL HIGH (ref 0.61–1.24)
GFR, Estimated: 39 mL/min — ABNORMAL LOW (ref >60)
Glucose, Bld: 172 mg/dL — ABNORMAL HIGH (ref 70–99)
Potassium: 3.7 mmol/L (ref 3.5–5.1)
Sodium: 135 mmol/L (ref 135–145)

## 2020-08-29 LAB — MAGNESIUM: Magnesium: 1.6 mg/dL — ABNORMAL LOW (ref 1.7–2.4)

## 2020-08-29 MED ORDER — MAGNESIUM SULFATE 2 GM/50ML IV SOLN
2.0000 g | Freq: Once | INTRAVENOUS | Status: AC
  Administered 2020-08-29: 2 g via INTRAVENOUS
  Filled 2020-08-29: qty 50

## 2020-08-29 MED ORDER — POTASSIUM CHLORIDE CRYS ER 20 MEQ PO TBCR
20.0000 meq | EXTENDED_RELEASE_TABLET | Freq: Two times a day (BID) | ORAL | Status: DC
  Administered 2020-08-29 – 2020-09-22 (×46): 20 meq via ORAL
  Filled 2020-08-29 (×49): qty 1

## 2020-08-29 NOTE — Progress Notes (Addendum)
PROGRESS NOTE    [name]  DXA:[id number] DOB: 01/12/1961 DOA: 08/21/2020 PCP: [name], NP     Brief Narrative:  [name] is a 60 year old man PMH including diastolic CHF, diabetes, morbid obesity, stroke presented with swelling, chest pain or shortness of breath.  Admitted for acute on chronic diastolic CHF.  Massive volume overload noted, cardiology consulted, continue diuresis, monitor renal function.   New events last 24 hours / Subjective: Refused PT yesterday.  Complains of abdominal pain and nausea, although his breakfast tray is completely emptied.  Urine output 1700 mL yesterday.  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   Pericardial effusion   Acute on chronic diastolic CHF (congestive heart failure) (HCC)   Acute on chronic diastolic heart failure -Echocardiogram revealed EF 65 to 20%, grade 2 diastolic dysfunction -Cardiology following  -Continue Lasix, Coreg -Continue fluid restriction, daily weight and strict I's and O's  AKI on CKD stage 3A -Baseline creatinine 1.5 -Creatinine remains around 2  RUE swelling -Venous Doppler negative for DVT in right upper extremity -Refusing CT  Hypoalbuminemia  -Albumin 2.4  -Also contributing to his anasarca  -Dietitian consulted   Pericardial effusion -No signs or symptoms of tamponade  History of CVA with residual right-sided weakness -PT OT recommending SNF placement -Continue aspirin, Lipitor  Diabetes mellitus type 2, uncontrolled with hyperglycemia -Continue Lantus, NovoLog, sliding scale insulin  Essential  hypertension -Continue amlodipine, Coreg, Lasix, BiDil -Renal artery duplex unfortunately limited due to body habitus  Depression -Continue Zoloft  BPH -Continue Flomax  Microcytic anemia -Iron studies consistent with anemia of chronic disease  Diarrhea -C. difficile negative, GI PCR negative   Hypomagnesemia -Replace, trend  Ileus -KUB showed mild gaseous distention of large and small bowel is noted suggesting ileus or less likely obstruction -Patient has been having BM, last one this morning -NPO and monitor    DVT prophylaxis:  enoxaparin (LOVENOX) injection 40 mg Start: 08/22/20 1000  Code Status: Full code   Family Communication: None at bedside Disposition Plan:  Status is: Inpatient  Remains inpatient appropriate because:Inpatient level of care appropriate due to severity of illness  Dispo: The patient is from: ALF              Anticipated d/c is to: SNF               Patient currently is not medically stable to d/c.   Difficult to place patient No      Antimicrobials:  Anti-infectives (From admission, onward)    None        Objective: Vitals:   08/28/20 1203 08/28/20 2204 08/29/20 0434 08/29/20 0946  BP: 130/79 110/65 129/77 (!) 142/73  Pulse: 75 73 73 78  Resp: 13 20 20    Temp: 98 F (36.7 C) 97.9 F (36.6 C) 98.4 F (36.9 C)   TempSrc:  Oral Oral   SpO2: 98% 100% 100%   Weight:   (!) 144.8 kg   Height:        Intake/Output Summary (Last 24 hours) at 08/29/2020 1040 Last data filed at 08/29/2020 0900 Gross  per 24 hour  Intake 360 ml  Output 1350 ml  Net -990 ml    Filed Weights   08/25/20 0500 08/26/20 0444 08/29/20 0434  Weight: (!) 141.3 kg (!) 144.4 kg (!) 144.8 kg   Examination: General exam: Appears calm and comfortable  Respiratory system: Clear to auscultation. Respiratory effort normal.  On room air Cardiovascular system: S1 & S2 heard, RRR. + Bilateral pitting edema, anasarca Gastrointestinal system: Abdomen is  nondistended, soft and tender to palpation generalized Central nervous system: Alert and oriented. Non focal exam. Speech clear  Extremities: Right upper extremity with more swelling compared to left Psychiatry: Judgement and insight appear poor  Data Reviewed: I have personally reviewed following labs and imaging studies  CBC: No results for input(s): WBC, NEUTROABS, HGB, HCT, MCV, PLT in the last 168 hours.  Basic Metabolic Panel: Recent Labs  Lab 08/25/20 0813 08/26/20 0521 08/27/20 0408 08/28/20 0504 08/29/20 0405  NA 136 136 136 137 135  K 4.9 3.5 3.6 3.4* 3.7  CL 106 105 105 105 104  CO2 22 24 25 26 24   GLUCOSE 124* 168* 114* 116* 172*  BUN 33* 35* 36* 40* 43*  CREATININE 2.11* 1.95* 1.93* 1.97* 1.94*  CALCIUM 7.9* 8.1* 8.2* 8.3* 8.3*  MG  --   --   --   --  1.6*    GFR: Estimated Creatinine Clearance: 57.4 mL/min (A) (by C-G formula based on SCr of 1.94 mg/dL (H)). Liver Function Tests: No results for input(s): AST, ALT, ALKPHOS, BILITOT, PROT, ALBUMIN in the last 168 hours.  No results for input(s): LIPASE, AMYLASE in the last 168 hours. No results for input(s): AMMONIA in the last 168 hours. Coagulation Profile: No results for input(s): INR, PROTIME in the last 168 hours. Cardiac Enzymes: No results for input(s): CKTOTAL, CKMB, CKMBINDEX, TROPONINI in the last 168 hours. BNP (last 3 results) No results for input(s): PROBNP in the last 8760 hours. HbA1C: No results for input(s): HGBA1C in the last 72 hours. CBG: Recent Labs  Lab 08/28/20 0733 08/28/20 1138 08/28/20 1642 08/28/20 2201 08/29/20 0754  GLUCAP 99 114* 161* 156* 149*    Lipid Profile: No results for input(s): CHOL, HDL, LDLCALC, TRIG, CHOLHDL, LDLDIRECT in the last 72 hours. Thyroid Function Tests: No results for input(s): TSH, T4TOTAL, FREET4, T3FREE, THYROIDAB in the last 72 hours. Anemia Panel: No results for input(s): VITAMINB12, FOLATE, FERRITIN, TIBC, IRON, RETICCTPCT in the last 72  hours.  Sepsis Labs: No results for input(s): PROCALCITON, LATICACIDVEN in the last 168 hours.  Recent Results (from the past 240 hour(s))  Resp Panel by RT-PCR (Flu A&B, Covid) Nasopharyngeal Swab     Status: None   Collection Time: 08/21/20 11:31 PM   Specimen: Nasopharyngeal Swab; Nasopharyngeal(NP) swabs in vial transport medium  Result Value Ref Range Status   SARS Coronavirus 2 by RT PCR NEGATIVE NEGATIVE Final    Comment: (NOTE) SARS-CoV-2 target nucleic acids are NOT DETECTED.  The SARS-CoV-2 RNA is generally detectable in upper respiratory specimens during the acute phase of infection. The lowest concentration of SARS-CoV-2 viral copies this assay can detect is 138 copies/mL. A negative result does not preclude SARS-Cov-2 infection and should not be used as the sole basis for treatment or other patient management decisions. A negative result may occur with  improper specimen collection/handling, submission of specimen other than nasopharyngeal swab, presence of viral mutation(s) within the areas targeted by this assay, and inadequate number of viral copies(<138 copies/mL). A negative result must  be combined with clinical observations, patient history, and epidemiological information. The expected result is Negative.  Fact Sheet for Patients:  EntrepreneurPulse.com.au  Fact Sheet for Healthcare Providers:  IncredibleEmployment.be  This test is no t yet approved or cleared by the [place] FDA and  has been authorized for detection and/or diagnosis of SARS-CoV-2 by FDA under an Emergency Use Authorization (EUA). This EUA will remain  in effect (meaning this test can be used) for the duration of the COVID-19 declaration under Section 564(b)(1) of the Act, 21 U.S.C.section 360bbb-3(b)(1), unless the authorization is terminated  or revoked sooner.       Influenza A by PCR NEGATIVE NEGATIVE Final   Influenza B by PCR NEGATIVE  NEGATIVE Final    Comment: (NOTE) The Xpert Xpress SARS-CoV-2/FLU/RSV plus assay is intended as an aid in the diagnosis of influenza from Nasopharyngeal swab specimens and should not be used as a sole basis for treatment. Nasal washings and aspirates are unacceptable for Xpert Xpress SARS-CoV-2/FLU/RSV testing.  Fact Sheet for Patients: EntrepreneurPulse.com.au  Fact Sheet for Healthcare Providers: IncredibleEmployment.be  This test is not yet approved or cleared by the [place] FDA and has been authorized for detection and/or diagnosis of SARS-CoV-2 by FDA under an Emergency Use Authorization (EUA). This EUA will remain in effect (meaning this test can be used) for the duration of the COVID-19 declaration under Section 564(b)(1) of the Act, 21 U.S.C. section 360bbb-3(b)(1), unless the authorization is terminated or revoked.  Performed at [facility], [place] [address] [place], [place] [address]   MRSA PCR Screening     Status: None   Collection Time: 08/22/20  5:39 PM  Result Value Ref Range Status   MRSA by PCR NEGATIVE NEGATIVE Final    Comment:        The GeneXpert MRSA Assay (FDA approved for NASAL specimens only), is one component of a comprehensive MRSA colonization surveillance program. It is not intended to diagnose MRSA infection nor to guide or monitor treatment for MRSA infections. Performed at [facility], [place] [address] [place], [place] [address]   C Difficile Quick Screen w PCR reflex     Status: None   Collection Time: 08/23/20  4:57 PM   Specimen: STOOL  Result Value Ref Range Status   C Diff antigen NEGATIVE NEGATIVE Final   C Diff toxin NEGATIVE NEGATIVE Final   C Diff interpretation No C. difficile detected.  Final    Comment: Performed at [facility], [place] [address] [place],  [address]  Gastrointestinal Panel by PCR , Stool     Status: None    Collection Time: 08/23/20  4:58 PM   Specimen: STOOL  Result Value Ref Range Status   Campylobacter species NOT DETECTED NOT DETECTED Final   Plesimonas shigelloides NOT DETECTED NOT DETECTED Final   Salmonella species NOT DETECTED NOT DETECTED Final   Yersinia enterocolitica NOT DETECTED NOT DETECTED Final   Vibrio species NOT DETECTED NOT DETECTED Final   Vibrio cholerae NOT DETECTED NOT DETECTED Final   Enteroaggregative E coli (EAEC) NOT DETECTED NOT DETECTED Final   Enteropathogenic E coli (EPEC) NOT DETECTED NOT DETECTED Final   Enterotoxigenic E coli (ETEC) NOT DETECTED NOT DETECTED Final   Shiga like toxin producing E coli (STEC) NOT DETECTED NOT DETECTED Final   Shigella/Enteroinvasive E coli (EIEC) NOT DETECTED NOT DETECTED Final   Cryptosporidium NOT DETECTED NOT DETECTED Final   Cyclospora cayetanensis NOT DETECTED NOT DETECTED Final  Entamoeba histolytica NOT DETECTED NOT DETECTED Final   Giardia lamblia NOT DETECTED NOT DETECTED Final   Adenovirus F40/41 NOT DETECTED NOT DETECTED Final   Astrovirus NOT DETECTED NOT DETECTED Final   Norovirus GI/GII NOT DETECTED NOT DETECTED Final   Rotavirus A NOT DETECTED NOT DETECTED Final   Sapovirus (I, II, IV, and V) NOT DETECTED NOT DETECTED Final    Comment: Performed at [facility], [address] [place], [place] [address]       Radiology Studies: No results found.    Scheduled Meds:  amLODipine  10 mg Oral Daily   aspirin  81 mg Oral Daily   atorvastatin  80 mg Oral QHS   carvedilol  12.5 mg Oral BID WC   enoxaparin (LOVENOX) injection  40 mg Subcutaneous Q24H   furosemide  80 mg Intravenous BID   insulin aspart  0-9 Units Subcutaneous TID WC   insulin aspart  3 Units Subcutaneous TID WC   insulin glargine  20 Units Subcutaneous QHS   isosorbide-hydrALAZINE  1 tablet Oral TID   pantoprazole  40 mg Oral Daily   pregabalin  75 mg Oral BID   saccharomyces boulardii  250 mg Oral BID   sodium chloride  flush  3 mL Intravenous Q12H   tamsulosin  0.8 mg Oral Daily   Continuous Infusions:  sodium chloride       LOS: 7 days      Time spent: 25 minutes   Dessa Phi, DO [facility] 08/29/2020, 10:40 AM   Available via Epic secure chat 7am-7pm After these hours, please refer to coverage provider listed on [email or web address]

## 2020-08-29 NOTE — Progress Notes (Signed)
MD has made Pt NPO. Pt very angry and upset regarding not being able to eat. Pt states " I am diabetic and need to eat" RN tried explaining to Pt but Pt yelling and now states "I will just have my brother bring me food" MD sent a secure chat

## 2020-08-29 NOTE — Progress Notes (Signed)
Physical Therapy Treatment Patient Details Name: [name] MRN: [id number] DOB: May 06, 1960 Today's Date: 08/29/2020    History of Present Illness EFTON THOMLEY is a 60 year old man PMH including diastolic CHF, diabetes, morbid obesity, stroke presented with swelling, chest pain or shortness of breath.  Admitted for acute on chronic diastolic CHF.  Massive volume overload noted, cardiology consulted, continue diuresis, monitor renal function.    PT Comments    Pt will need ST Rehab at SNF to regain his mobility  Pt agreed to work with [place] around 10:30 this morning.  Attempted OOB transfer to recliner was unsuccessful.  Prior at his ALF pt stated he was able to get OOB to his Power [facility] with minimal staff help.  Today pt required Total Assist + 2. Supine to sit: Total assist;+2 for physical assistance;+2 for safety/equipment.  Lateral/Scoot Transfers: Total assist;+2 physical assistance;+2 safety/equipment;From elevated surface General transfer comment: attempted a lateral scoot from elevated bed to drop arm recliner however unable to complete.  Despite + 2 assist pt was unable to self assist enough (<10%) to weightshift and perform any scooting.  Had to abort and return pt back to supine in bed.  Rec Maxi SKY LIFT for any OOB activity. General Gait Details: pt has been non amb for some time and uses a power wc for mobility at his ALF. Pt will need ST Rehab at SNF prior to returning to ALF level.   Follow Up Recommendations  SNF     Equipment Recommendations  None recommended by PT    Recommendations for Other Services       Precautions / Restrictions Precautions Precautions: Fall Precaution Comments: scrotal  and LE edema painful Restrictions Weight Bearing Restrictions: No    Mobility  Bed Mobility Overal bed mobility: Needs Assistance Bed Mobility: Rolling;Supine to Sit;Sit to Supine Rolling: +2 for physical assistance;Total assist;Max assist   Supine to sit: Total assist;+2  for physical assistance;+2 for safety/equipment Sit to supine: Total assist;+2 for physical assistance;+2 for safety/equipment   General bed mobility comments: getting pt to EOB was VERY difficult.  Required + 2 Total Assist and use of bed pad to complete.  Pt <10% self able due to weakness and BMI/ABD girth.  Once EOB pt present with Mod posterior lean and required Mod assist to prevent posterior LOB.    Transfers Overall transfer level: Needs assistance Equipment used: None Transfers: Lateral/Scoot Transfers          Lateral/Scoot Transfers: Total assist;+2 physical assistance;+2 safety/equipment;From elevated surface General transfer comment: attempted a lateral scoot from elevated bed to drop arm recliner however unable to complete.  Despite + 2 assist pt was unable to self assist enough (<10%) to weightshift and perform any scooting.  Had to abort and return pt back to supine in bed.  Rec Maxi SKY LIFT for any OOB activity.  Ambulation/Gait             General Gait Details: pt has been non amb for some time and uses a power wc for mobility at his ALF.   Stairs             Wheelchair Mobility    Modified Rankin (Stroke Patients Only)       Balance  Cognition Arousal/Alertness: Awake/alert Behavior During Therapy: WFL for tasks assessed/performed Overall Cognitive Status: Within Functional Limits for tasks assessed                                 General Comments: AxO x 3 present with some anxiety and control issues of how he does things.  Also present with poor medical insight.      Exercises      General Comments        Pertinent Vitals/Pain Pain Assessment: Faces Faces Pain Scale: Hurts even more Pain Location: scrotum and bil LEs with activity Pain Descriptors / Indicators: Discomfort;Guarding Pain Intervention(s): Monitored during session;Repositioned    Home Living                       Prior Function            PT Goals (current goals can now be found in the care plan section) Progress towards PT goals: Progressing toward goals    Frequency    Min 2X/week      PT Plan Current plan remains appropriate    Co-evaluation              AM-PAC PT "6 Clicks" Mobility   Outcome Measure  Help needed turning from your back to your side while in a flat bed without using bedrails?: Total Help needed moving from lying on your back to sitting on the side of a flat bed without using bedrails?: Total Help needed moving to and from a bed to a chair (including a wheelchair)?: Total Help needed standing up from a chair using your arms (e.g., wheelchair or bedside chair)?: Total Help needed to walk in hospital room?: Total Help needed climbing 3-5 steps with a railing? : Total 6 Click Score: 6    End of Session Equipment Utilized During Treatment: Gait belt Activity Tolerance: Patient limited by fatigue;Other (comment) (BMI and ABD girth) Patient left: in bed;with call bell/phone within reach;with bed alarm set Nurse Communication: Mobility status;Need for lift equipment PT Visit Diagnosis: Muscle weakness (generalized) (M62.81);Other abnormalities of gait and mobility (R26.89)     Time: 2549-8264 PT Time Calculation (min) (ACUTE ONLY): 25 min  Charges:  $Therapeutic Exercise: 23-37 mins                     Rica Koyanagi  PTA Acute  Rehabilitation Services Pager      [phone] Office      [phone]

## 2020-08-30 ENCOUNTER — Inpatient Hospital Stay (HOSPITAL_COMMUNITY): Payer: Medicare HMO

## 2020-08-30 ENCOUNTER — Encounter (HOSPITAL_COMMUNITY): Payer: Self-pay | Admitting: Internal Medicine

## 2020-08-30 DIAGNOSIS — I5033 Acute on chronic diastolic (congestive) heart failure: Secondary | ICD-10-CM | POA: Diagnosis not present

## 2020-08-30 DIAGNOSIS — N179 Acute kidney failure, unspecified: Secondary | ICD-10-CM | POA: Diagnosis not present

## 2020-08-30 LAB — BASIC METABOLIC PANEL
Anion gap: 6 (ref 5–15)
BUN: 41 mg/dL — ABNORMAL HIGH (ref 6–20)
CO2: 25 mmol/L (ref 22–32)
Calcium: 8.3 mg/dL — ABNORMAL LOW (ref 8.9–10.3)
Chloride: 104 mmol/L (ref 98–111)
Creatinine, Ser: 1.96 mg/dL — ABNORMAL HIGH (ref 0.61–1.24)
GFR, Estimated: 39 mL/min — ABNORMAL LOW (ref >60)
Glucose, Bld: 155 mg/dL — ABNORMAL HIGH (ref 70–99)
Potassium: 3.8 mmol/L (ref 3.5–5.1)
Sodium: 135 mmol/L (ref 135–145)

## 2020-08-30 LAB — CBC
HCT: 27.4 % — ABNORMAL LOW (ref 39.0–52.0)
Hemoglobin: 8.3 g/dL — ABNORMAL LOW (ref 13.0–17.0)
MCH: 24.8 pg — ABNORMAL LOW (ref 26.0–34.0)
MCHC: 30.3 g/dL (ref 30.0–36.0)
MCV: 81.8 fL (ref 80.0–100.0)
Platelets: 233 10*3/uL (ref 150–400)
RBC: 3.35 MIL/uL — ABNORMAL LOW (ref 4.22–5.81)
RDW: 15.3 % (ref 11.5–15.5)
WBC: 3.4 10*3/uL — ABNORMAL LOW (ref 4.0–10.5)
nRBC: 0 % (ref 0.0–0.2)

## 2020-08-30 LAB — GLUCOSE, CAPILLARY
Glucose-Capillary: 107 mg/dL — ABNORMAL HIGH (ref 70–99)
Glucose-Capillary: 150 mg/dL — ABNORMAL HIGH (ref 70–99)
Glucose-Capillary: 146 mg/dL — ABNORMAL HIGH (ref 70–99)
Glucose-Capillary: 215 mg/dL — ABNORMAL HIGH (ref 70–99)

## 2020-08-30 LAB — MAGNESIUM: Magnesium: 1.8 mg/dL (ref 1.7–2.4)

## 2020-08-30 MED ORDER — POTASSIUM CHLORIDE 10 MEQ/100ML IV SOLN
10.0000 meq | INTRAVENOUS | Status: AC
  Administered 2020-08-30 (×4): 10 meq via INTRAVENOUS
  Filled 2020-08-30 (×4): qty 100

## 2020-08-30 MED ORDER — IOHEXOL 9 MG/ML PO SOLN
500.0000 mL | ORAL | Status: AC
  Administered 2020-08-30 (×2): 500 mL via ORAL

## 2020-08-30 MED ORDER — HYDROGEN PEROXIDE 3 % EX SOLN
CUTANEOUS | Status: AC
  Filled 2020-08-30: qty 473

## 2020-08-30 MED ORDER — IOHEXOL 9 MG/ML PO SOLN
ORAL | Status: AC
  Filled 2020-08-30: qty 1000

## 2020-08-30 MED ORDER — MAGNESIUM SULFATE 2 GM/50ML IV SOLN
2.0000 g | Freq: Once | INTRAVENOUS | Status: AC
  Administered 2020-08-30: 2 g via INTRAVENOUS
  Filled 2020-08-30: qty 50

## 2020-08-30 MED ORDER — IOHEXOL 9 MG/ML PO SOLN: 500.0000 mL | ORAL | Status: DC

## 2020-08-30 MED ORDER — POLYETHYLENE GLYCOL 3350 17 G PO PACK
17.0000 g | PACK | Freq: Three times a day (TID) | ORAL | Status: DC
  Administered 2020-08-31: 17 g via ORAL
  Filled 2020-08-30 (×4): qty 1

## 2020-08-30 NOTE — Progress Notes (Addendum)
Progress Note    DILLINGER ASTON  AQT:[id number] DOB: 21-Mar-1960  DOA: 08/21/2020 PCP: [name], NP      Brief Narrative:    Medical records reviewed and are as summarized below:  [name] is a 60 y.o. male PMH including chronic diastolic CHF, diabetes, morbid obesity, stroke presented with swelling, chest pain or shortness of breath.  Admitted for acute on chronic diastolic CHF.  Massive volume overload noted, cardiology consulted, continue diuresis, monitor renal function.      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  (Morbid obesity)  Acute on chronic diastolic CHF: 2D echo showed EF estimated at 65 to 56%, grade 2 diastolic dysfunction.  Continue IV Lasix.  Monitor daily weight, urine output and BMP.  AKI on CKD stage IIIa: Continue IV diuresis for now.  Colonic distention,?  Sigmoid volvulus: Make NPO.  Ordered CT abdomen pelvis with oral contrast but without IV contrast because of CKD.  Consulted general surgeon and gastroenterologist for further evaluation.  Hypokalemia: Replete potassium and monitor levels.  Right upper extremity swelling: Venous duplex of the upper extremity did not show any evidence of DVT.  Recent diarrhea: Resolved.  Stool for C. difficile and GI panel were negative.  History of stroke with right-sided weakness: Continue aspirin and Lipitor.  PT recommends discharge to SNF.  Pericardial effusion: Stable with no signs of cardiac  tamponade.  Are comorbidities include depression, BPH, type II DM, hypoalbuminemia, anemia of chronic disease  Diet Order             Diet NPO time specified  Diet effective now                      Consultants: General surgeon Gastroenterologist Cardiologist  Procedures: None    Medications:    amLODipine  10 mg Oral Daily   aspirin  81 mg Oral Daily   atorvastatin  80 mg Oral QHS   carvedilol  12.5 mg Oral BID WC   enoxaparin (LOVENOX) injection  40 mg Subcutaneous Q24H   furosemide  80 mg Intravenous BID   hydrogen peroxide       insulin aspart  0-9 Units Subcutaneous TID WC   insulin aspart  3 Units Subcutaneous TID WC   insulin glargine  20 Units Subcutaneous QHS   isosorbide-hydrALAZINE  1 tablet Oral TID   pantoprazole  40 mg Oral Daily   potassium chloride  20 mEq Oral BID   pregabalin  75 mg Oral BID   saccharomyces boulardii  250 mg Oral BID   sodium chloride flush  3 mL Intravenous Q12H   tamsulosin  0.8 mg Oral Daily   Continuous Infusions:  sodium chloride       Anti-infectives (From admission, onward)    None              Family Communication/Anticipated D/C date and plan/Code Status   DVT prophylaxis: enoxaparin (LOVENOX) injection 40  mg Start: 08/22/20 1000     Code Status: Full Code  Family Communication: None Disposition Plan:    Status is: Inpatient  Remains inpatient appropriate because:IV treatments appropriate due to intensity of illness or inability to take PO and Inpatient level of care appropriate due to severity of illness  Dispo: The patient is from: Home              Anticipated d/c is to: SNF              Patient currently is not medically stable to d/c.   Difficult to place patient No           Subjective:   C/o abdominal pain.  No nausea or vomiting.  He said he moved his bowels yesterday.  No shortness of breath or chest pain.  Objective:    Vitals:   08/29/20 0946 08/29/20 1242  08/29/20 2054 08/30/20 0632  BP: (!) 142/73 (!) 153/89 127/78 132/88  Pulse: 78 75 70 70  Resp:  15 20   Temp:  97.6 F (36.4 C) 97.6 F (36.4 C) 98.4 F (36.9 C)  TempSrc:  Oral Oral Oral  SpO2:   100% 99%  Weight:      Height:       No data found.   Intake/Output Summary (Last 24 hours) at 08/30/2020 1300 Last data filed at 08/30/2020 1000 Gross per 24 hour  Intake 650 ml  Output 1200 ml  Net -550 ml   Filed Weights   08/25/20 0500 08/26/20 0444 08/29/20 0434  Weight: (!) 141.3 kg (!) 144.4 kg (!) 144.8 kg    Exam:  GEN: NAD SKIN: No rash EYES: EOMI ENT: MMM CV: RRR PULM: CTA B ABD: soft, distended, NT, +BS CNS: AAO x 3, non focal EXT: Bilateral leg edema 2+, no tenderness        Data Reviewed:   I have personally reviewed following labs and imaging studies:  Labs: Labs show the following:   Basic Metabolic Panel: Recent Labs  Lab 08/26/20 0521 08/27/20 0408 08/28/20 0504 08/29/20 0405 08/30/20 0353  NA 136 136 137 135 135  K 3.5 3.6 3.4* 3.7 3.8  CL 105 105 105 104 104  CO2 24 25 26 24 25   GLUCOSE 168* 114* 116* 172* 155*  BUN 35* 36* 40* 43* 41*  CREATININE 1.95* 1.93* 1.97* 1.94* 1.96*  CALCIUM 8.1* 8.2* 8.3* 8.3* 8.3*  MG  --   --   --  1.6* 1.8   GFR Estimated Creatinine Clearance: 56.8 mL/min (A) (by C-G formula based on SCr of 1.96 mg/dL (H)). Liver Function Tests: No results for input(s): AST, ALT, ALKPHOS, BILITOT, PROT, ALBUMIN in the last 168 hours. No results for input(s): LIPASE, AMYLASE in the last 168 hours. No results for input(s): AMMONIA in the last 168 hours. Coagulation profile No results for input(s): INR, PROTIME in the last 168 hours.  CBC: Recent Labs  Lab 08/30/20 0353  WBC 3.4*  HGB 8.3*  HCT 27.4*  MCV 81.8  PLT 233   Cardiac Enzymes: No results for input(s): CKTOTAL, CKMB, CKMBINDEX, TROPONINI in the last 168 hours. BNP (last 3 results) No results for input(s): PROBNP in the last 8760  hours. CBG: Recent Labs  Lab 08/29/20 1133 08/29/20 1648 08/29/20 2251 08/30/20 0741 08/30/20 1140  GLUCAP 145* 115* 207* 107* 150*   D-Dimer: No results for input(s): DDIMER in the last 72 hours. Hgb A1c: No results for input(s): HGBA1C in the last  72 hours. Lipid Profile: No results for input(s): CHOL, HDL, LDLCALC, TRIG, CHOLHDL, LDLDIRECT in the last 72 hours. Thyroid function studies: No results for input(s): TSH, T4TOTAL, T3FREE, THYROIDAB in the last 72 hours.  Invalid input(s): FREET3 Anemia work up: No results for input(s): VITAMINB12, FOLATE, FERRITIN, TIBC, IRON, RETICCTPCT in the last 72 hours. Sepsis Labs: Recent Labs  Lab 08/30/20 0353  WBC 3.4*    Microbiology Recent Results (from the past 240 hour(s))  Resp Panel by RT-PCR (Flu A&B, Covid) Nasopharyngeal Swab     Status: None   Collection Time: 08/21/20 11:31 PM   Specimen: Nasopharyngeal Swab; Nasopharyngeal(NP) swabs in vial transport medium  Result Value Ref Range Status   SARS Coronavirus 2 by RT PCR NEGATIVE NEGATIVE Final    Comment: (NOTE) SARS-CoV-2 target nucleic acids are NOT DETECTED.  The SARS-CoV-2 RNA is generally detectable in upper respiratory specimens during the acute phase of infection. The lowest concentration of SARS-CoV-2 viral copies this assay can detect is 138 copies/mL. A negative result does not preclude SARS-Cov-2 infection and should not be used as the sole basis for treatment or other patient management decisions. A negative result may occur with  improper specimen collection/handling, submission of specimen other than nasopharyngeal swab, presence of viral mutation(s) within the areas targeted by this assay, and inadequate number of viral copies(<138 copies/mL). A negative result must be combined with clinical observations, patient history, and epidemiological information. The expected result is Negative.  Fact Sheet for Patients:   EntrepreneurPulse.com.au  Fact Sheet for Healthcare Providers:  IncredibleEmployment.be  This test is no t yet approved or cleared by the [place] FDA and  has been authorized for detection and/or diagnosis of SARS-CoV-2 by FDA under an Emergency Use Authorization (EUA). This EUA will remain  in effect (meaning this test can be used) for the duration of the COVID-19 declaration under Section 564(b)(1) of the Act, 21 U.S.C.section 360bbb-3(b)(1), unless the authorization is terminated  or revoked sooner.       Influenza A by PCR NEGATIVE NEGATIVE Final   Influenza B by PCR NEGATIVE NEGATIVE Final    Comment: (NOTE) The Xpert Xpress SARS-CoV-2/FLU/RSV plus assay is intended as an aid in the diagnosis of influenza from Nasopharyngeal swab specimens and should not be used as a sole basis for treatment. Nasal washings and aspirates are unacceptable for Xpert Xpress SARS-CoV-2/FLU/RSV testing.  Fact Sheet for Patients: EntrepreneurPulse.com.au  Fact Sheet for Healthcare Providers: IncredibleEmployment.be  This test is not yet approved or cleared by the [place] FDA and has been authorized for detection and/or diagnosis of SARS-CoV-2 by FDA under an Emergency Use Authorization (EUA). This EUA will remain in effect (meaning this test can be used) for the duration of the COVID-19 declaration under Section 564(b)(1) of the Act, 21 U.S.C. section 360bbb-3(b)(1), unless the authorization is terminated or revoked.  Performed at [facility], [place] [address] [place], [place] [address]   MRSA PCR Screening     Status: None   Collection Time: 08/22/20  5:39 PM  Result Value Ref Range Status   MRSA by PCR NEGATIVE NEGATIVE Final    Comment:        The GeneXpert MRSA Assay (FDA approved for NASAL specimens only), is one component of a comprehensive MRSA colonization surveillance  program. It is not intended to diagnose MRSA infection nor to guide or monitor treatment for MRSA infections. Performed at [facility], [name] [address] [place], [place] [address]  C Difficile Quick Screen w PCR reflex     Status: None   Collection Time: 08/23/20  4:57 PM   Specimen: STOOL  Result Value Ref Range Status   C Diff antigen NEGATIVE NEGATIVE Final   C Diff toxin NEGATIVE NEGATIVE Final   C Diff interpretation No C. difficile detected.  Final    Comment: Performed at [facility], [place] [address] [place], [place] [address]  Gastrointestinal Panel by PCR , Stool     Status: None   Collection Time: 08/23/20  4:58 PM   Specimen: STOOL  Result Value Ref Range Status   Campylobacter species NOT DETECTED NOT DETECTED Final   Plesimonas shigelloides NOT DETECTED NOT DETECTED Final   Salmonella species NOT DETECTED NOT DETECTED Final   Yersinia enterocolitica NOT DETECTED NOT DETECTED Final   Vibrio species NOT DETECTED NOT DETECTED Final   Vibrio cholerae NOT DETECTED NOT DETECTED Final   Enteroaggregative E coli (EAEC) NOT DETECTED NOT DETECTED Final   Enteropathogenic E coli (EPEC) NOT DETECTED NOT DETECTED Final   Enterotoxigenic E coli (ETEC) NOT DETECTED NOT DETECTED Final   Shiga like toxin producing E coli (STEC) NOT DETECTED NOT DETECTED Final   Shigella/Enteroinvasive E coli (EIEC) NOT DETECTED NOT DETECTED Final   Cryptosporidium NOT DETECTED NOT DETECTED Final   Cyclospora cayetanensis NOT DETECTED NOT DETECTED Final   Entamoeba histolytica NOT DETECTED NOT DETECTED Final   Giardia lamblia NOT DETECTED NOT DETECTED Final   Adenovirus F40/41 NOT DETECTED NOT DETECTED Final   Astrovirus NOT DETECTED NOT DETECTED Final   Norovirus GI/GII NOT DETECTED NOT DETECTED Final   Rotavirus A NOT DETECTED NOT DETECTED Final   Sapovirus (I, II, IV, and V) NOT DETECTED NOT DETECTED Final    Comment: Performed at [facility],  [place] [place], [place] [address]    Procedures and diagnostic studies:  DG Abd 1 View  Result Date: 08/30/2020 CLINICAL DATA:  Abdominal distention.  Pain. EXAM: ABDOMEN - 1 VIEW COMPARISON:  08/29/2020.  CT 08/18/2020. FINDINGS: Colonic distention again noted. Similar finding noted on prior studies including CT of 08/18/2020. The possibility of sigmoid volvulus cannot be excluded on today's exam and repeat CT of the abdomen and pelvis suggested. No free air identified. Right hemidiaphragm incompletely imaged. No acute bony abnormality. IMPRESSION: Colonic distention again noted. Similar findings noted on prior study including CT of 08/18/2020. The possibly of the sigmoid volvulus cannot be excluded on today's exam and repeat CT of the abdomen and pelvis suggested. These results will be called to the ordering clinician or representative by the Radiologist Assistant, and communication documented in the PACS or [employer]. Electronically Signed   By: Marcello Moores  Register   On: 08/30/2020 11:46   DG Abd 1 View  Result Date: 08/29/2020 CLINICAL DATA:  Abdominal pain. EXAM: ABDOMEN - 1 VIEW COMPARISON:  Jun 26, 2020. FINDINGS: Mild gaseous distension of large and small bowel is noted suggesting ileus or less likely obstruction. No calcifications are noted IMPRESSION: Mild gaseous distention of large and small bowel is noted suggesting ileus or less likely obstruction. Follow-up radiographs recommended. Electronically Signed   By: Marijo Conception M.D.   On: 08/29/2020 12:38               LOS: 8 days   Yaqub Arney  [facility]   Pager on [email or web address]. If 7PM-7AM, please contact night-coverage at [email or web address]     08/30/2020, 1:00 PM

## 2020-08-30 NOTE — Consult Note (Signed)
[name] Jul 02, 1960  [id number].    Requesting MD: Dr. Mal Misty Chief Complaint/Reason for Consult: Abdominal distension. Colonic distension on xray  HPI: [name] is a 60 y.o. male with a hx of CAD, prior CVA w/ residual R sided weakness, wheelchair bound, CKD3, DM2, HTN, HLD, morbid obesity and CHF (EF 65-70%, no wall motion abn, mod LVH, & G2DD on Echo 6/28)  who we are asked to see for x-ray that showed colonic distention.   Patient underwent a CT scan for scrotal swelling and pain on 6/24 that showed stool in the rectum and distal sigmoid colon, causing partial functional obstruction of the colon.  He was discharged home from ED.  Patient represented on 6/28 for chest pain, shortness of breath and lower extremity swelling.  He was admitted for acute on chronic diastolic CHF.  Appears since admission he began complaining of generalized abdominal pain, bloating and nausea on 7/5.  Has been tolerating PO's p.o. without emesis and continued to have bowel function with last BM documented this AM.  X-ray was obtained that showed colonic distention unable to rule out sigmoid volvulus. CT was ordered. We were asked to see.  No prior abdominal surgeries.   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   Patient now in room, watching TV.  Feels "tight" but no pain.  ROS: Review of Systems  All other systems reviewed and are negative.   Family History  Problem Relation Age of Onset   Diabetes Other    Cancer Mother        uterus; deceased    Heart attack Mother 45   Hypertension Other    Heart attack Father 21   Stroke Sister        12/2012 age 39 y.o    Past Medical History:  Diagnosis Date   Arthritis    Bipolar affective ([place])    CHF (congestive heart failure) ([place])    Claustrophobia    Severe   Depression    Diabetes mellitus    uncontrolled    Heart murmur    Hypertension      Past Surgical History:  Procedure Laterality Date   BOWEL DECOMPRESSION N/A 01/19/2020   Procedure: BOWEL DECOMPRESSION;  Surgeon: Otis Brace, MD;  Location: [place];  Service: Gastroenterology;  Laterality: N/A;   COLONOSCOPY WITH PROPOFOL N/A 01/19/2020   Procedure: COLONOSCOPY WITH PROPOFOL;  Surgeon: Otis Brace, MD;  Location: [place];  Service: Gastroenterology;  Laterality: N/A;   KNEE SURGERY     b/l knees; pending total knee    RADIOLOGY WITH ANESTHESIA N/A 07/04/2020   Procedure: MRI WITH ANESTHESIA BRAIN WITHOUT CONTRAST AND HEAD WITHOUT CONTRAST;  Surgeon: Radiologist, Medication, MD;  Location: [place];  Service: Radiology;  Laterality: N/A;    Social History:  reports that he has quit smoking. He has never used smokeless tobacco. He reports previous drug use. Drug: Cocaine. He reports that he does not drink alcohol.  Allergies:  Allergies  Allergen Reactions   Other Anaphylaxis, Itching and Swelling    Allergy to GRITS , unknown exactly the specific ingredient he is allergic too    Medications Prior to Admission  Medication Sig Dispense Refill   amLODipine (NORVASC) 10 MG tablet Take 1 tablet (10 mg total) by mouth daily. 90 tablet 0   ammonium lactate (LAC-HYDRIN) 12 % lotion Apply 1 application topically 2 (two) times daily. Both legs  aspirin 81 MG chewable tablet Chew 1 tablet (81 mg total) by mouth daily. 90 tablet 0   atorvastatin (LIPITOR) 80 MG tablet Take 1 tablet (80 mg total) by mouth at bedtime. 90 tablet 0   carvedilol (COREG) 12.5 MG tablet Take 2 tablets (25 mg total) by mouth 2 (two) times daily with a meal. (Patient taking differently: Take 12.5 mg by mouth 2 (two) times daily with a meal.) 360 tablet 3   Ensure (ENSURE) Take 237 mLs by mouth 2 (two) times daily between meals.     insulin aspart (NOVOLOG) 100 UNIT/ML injection Inject 12 Units into the skin 3 (three) times daily before meals. For blood sugars 0-150 give 0 units  of insulin, 151-200 give 2 units of insulin, 201-250 give 4 units, 251-300 give 6 units, 301-350 give 8 units, 351-400 give 10 units,> 400 give 12 units and call M.D. (Patient taking differently: Inject 2-12 Units into the skin See admin instructions. Check and record blood sugar before meals and inject per ssi. 151-200 = 2 units  201-250 = 4 units  251-300 = 6 units  301-350 = 8 units  351-400 = 10 units  greater than 400 = 12 units and call MD.) 10 mL PRN   Insulin Glargine (BASAGLAR KWIKPEN) 100 UNIT/ML Inject 30 Units into the skin at bedtime. 27 mL 0   NON FORMULARY Take 237 mLs by mouth in the morning and at bedtime. ensure drink     ondansetron (ZOFRAN ODT) 4 MG disintegrating tablet 4mg  ODT q4 hours prn nausea/vomit (Patient taking differently: Take 4 mg by mouth every 4 (four) hours as needed for nausea or vomiting.) 10 tablet 0   pantoprazole (PROTONIX) 40 MG tablet Take 40 mg by mouth daily.     polyethylene glycol (MIRALAX / GLYCOLAX) 17 g packet Take 17 g by mouth daily as needed. (Patient taking differently: Take 17 g by mouth daily as needed for mild constipation.) 100 each 11   pregabalin (LYRICA) 75 MG capsule Take 1 capsule (75 mg total) by mouth 2 (two) times daily. 180 capsule 0   sacubitril-valsartan (ENTRESTO) 49-51 MG Take 1 tablet by mouth 2 (two) times daily. 180 tablet 0   sertraline (ZOLOFT) 50 MG tablet Take 50 mg by mouth daily.     tamsulosin (FLOMAX) 0.4 MG CAPS capsule Take 2 capsules (0.8 mg total) by mouth daily. 180 capsule 0   torsemide (DEMADEX) 20 MG tablet Take 2 tablets (40 mg total) by mouth daily. 60 tablet 0   [EXPIRED] fluconazole (DIFLUCAN) 150 MG tablet Take 1 tablet (150 mg total) by mouth daily for 10 days. 10 tablet 0   spironolactone (ALDACTONE) 25 MG tablet Take 1 tablet (25 mg total) by mouth daily. 90 tablet 1     Physical Exam: Blood pressure (!) 137/94, pulse 89, temperature 97.7 F (36.5 C), temperature source Oral, resp. rate 12, height  5\' 8"  (1.727 m), weight (!) 144.8 kg, SpO2 95 %. General: pleasant, WD/WN AA male who is laying in bed in NAD HEENT: head is normocephalic, atraumatic.  Sclera are noninjected.  PERRL.  Ears and nose without any masses or lesions.  Mouth is pink and moist. Dentition poor, nearly edentulous. Heart: regular, rate, and rhythm. Lungs: CTAB, no wheezes, rhonchi, or rales noted.  Respiratory effort nonlabored Abd: mild to moderate distension, non-tender MS: no deformity, mild edema bilat LE Skin: warm and dry with no masses, lesions, or rashes Psych: A&Ox4 with an appropriate affect   Results  for orders placed or performed during the hospital encounter of 08/21/20 (from the past 48 hour(s))  Glucose, capillary     Status: Abnormal   Collection Time: 08/28/20  4:42 PM  Result Value Ref Range   Glucose-Capillary 161 (H) 70 - 99 mg/dL    Comment: Glucose reference range applies only to samples taken after fasting for at least 8 hours.  Glucose, capillary     Status: Abnormal   Collection Time: 08/28/20 10:01 PM  Result Value Ref Range   Glucose-Capillary 156 (H) 70 - 99 mg/dL    Comment: Glucose reference range applies only to samples taken after fasting for at least 8 hours.  Basic metabolic panel     Status: Abnormal   Collection Time: 08/29/20  4:05 AM  Result Value Ref Range   Sodium 135 135 - 145 mmol/L   Potassium 3.7 3.5 - 5.1 mmol/L   Chloride 104 98 - 111 mmol/L   CO2 24 22 - 32 mmol/L   Glucose, Bld 172 (H) 70 - 99 mg/dL    Comment: Glucose reference range applies only to samples taken after fasting for at least 8 hours.   BUN 43 (H) 6 - 20 mg/dL   Creatinine, Ser 1.94 (H) 0.61 - 1.24 mg/dL   Calcium 8.3 (L) 8.9 - 10.3 mg/dL   GFR, Estimated 39 (L) >60 mL/min    Comment: (NOTE) Calculated using the CKD-EPI Creatinine Equation (2021)    Anion gap 7 5 - 15    Comment: Performed at [facility], [place] [address] [place], [place] [address]  Magnesium     Status:  Abnormal   Collection Time: 08/29/20  4:05 AM  Result Value Ref Range   Magnesium 1.6 (L) 1.7 - 2.4 mg/dL    Comment: Performed at [facility], [place] [address] [place], [place] [address]  Glucose, capillary     Status: Abnormal   Collection Time: 08/29/20  7:54 AM  Result Value Ref Range   Glucose-Capillary 149 (H) 70 - 99 mg/dL    Comment: Glucose reference range applies only to samples taken after fasting for at least 8 hours.  Glucose, capillary     Status: Abnormal   Collection Time: 08/29/20 11:33 AM  Result Value Ref Range   Glucose-Capillary 145 (H) 70 - 99 mg/dL    Comment: Glucose reference range applies only to samples taken after fasting for at least 8 hours.  Glucose, capillary     Status: Abnormal   Collection Time: 08/29/20  4:48 PM  Result Value Ref Range   Glucose-Capillary 115 (H) 70 - 99 mg/dL    Comment: Glucose reference range applies only to samples taken after fasting for at least 8 hours.  Glucose, capillary     Status: Abnormal   Collection Time: 08/29/20 10:51 PM  Result Value Ref Range   Glucose-Capillary 207 (H) 70 - 99 mg/dL    Comment: Glucose reference range applies only to samples taken after fasting for at least 8 hours.  CBC     Status: Abnormal   Collection Time: 08/30/20  3:53 AM  Result Value Ref Range   WBC 3.4 (L) 4.0 - 10.5 K/uL   RBC 3.35 (L) 4.22 - 5.81 MIL/uL   Hemoglobin 8.3 (L) 13.0 - 17.0 g/dL   HCT 27.4 (L) 39.0 - 52.0 %   MCV 81.8 80.0 - 100.0 fL   MCH 24.8 (L) 26.0 - 34.0 pg   MCHC 30.3 30.0 - 36.0  g/dL   RDW 15.3 11.5 - 15.5 %   Platelets 233 150 - 400 K/uL   nRBC 0.0 0.0 - 0.2 %    Comment: Performed at [facility], [place] [address], [place], [place] [address]  Basic metabolic panel     Status: Abnormal   Collection Time: 08/30/20  3:53 AM  Result Value Ref Range   Sodium 135 135 - 145 mmol/L   Potassium 3.8 3.5 - 5.1 mmol/L   Chloride 104 98 - 111 mmol/L   CO2 25 22 - 32 mmol/L    Glucose, Bld 155 (H) 70 - 99 mg/dL    Comment: Glucose reference range applies only to samples taken after fasting for at least 8 hours.   BUN 41 (H) 6 - 20 mg/dL   Creatinine, Ser 1.96 (H) 0.61 - 1.24 mg/dL   Calcium 8.3 (L) 8.9 - 10.3 mg/dL   GFR, Estimated 39 (L) >60 mL/min    Comment: (NOTE) Calculated using the CKD-EPI Creatinine Equation (2021)    Anion gap 6 5 - 15    Comment: Performed at [facility], [place] [address] [place], [place] [address]  Magnesium     Status: None   Collection Time: 08/30/20  3:53 AM  Result Value Ref Range   Magnesium 1.8 1.7 - 2.4 mg/dL    Comment: Performed at [facility], [place] [address] [place], [place] [address]  Glucose, capillary     Status: Abnormal   Collection Time: 08/30/20  7:41 AM  Result Value Ref Range   Glucose-Capillary 107 (H) 70 - 99 mg/dL    Comment: Glucose reference range applies only to samples taken after fasting for at least 8 hours.  Glucose, capillary     Status: Abnormal   Collection Time: 08/30/20 11:40 AM  Result Value Ref Range   Glucose-Capillary 150 (H) 70 - 99 mg/dL    Comment: Glucose reference range applies only to samples taken after fasting for at least 8 hours.   DG Abd 1 View  Result Date: 08/30/2020 CLINICAL DATA:  Abdominal distention.  Pain. EXAM: ABDOMEN - 1 VIEW COMPARISON:  08/29/2020.  CT 08/18/2020. FINDINGS: Colonic distention again noted. Similar finding noted on prior studies including CT of 08/18/2020. The possibility of sigmoid volvulus cannot be excluded on today's exam and repeat CT of the abdomen and pelvis suggested. No free air identified. Right hemidiaphragm incompletely imaged. No acute bony abnormality. IMPRESSION: Colonic distention again noted. Similar findings noted on prior study including CT of 08/18/2020. The possibly of the sigmoid volvulus cannot be excluded on today's exam and repeat CT of the abdomen and pelvis suggested. These results will be  called to the ordering clinician or representative by the Radiologist Assistant, and communication documented in the PACS or [employer]. Electronically Signed   By: Marcello Moores  Register   On: 08/30/2020 11:46   DG Abd 1 View  Result Date: 08/29/2020 CLINICAL DATA:  Abdominal pain. EXAM: ABDOMEN - 1 VIEW COMPARISON:  Jun 26, 2020. FINDINGS: Mild gaseous distension of large and small bowel is noted suggesting ileus or less likely obstruction. No calcifications are noted IMPRESSION: Mild gaseous distention of large and small bowel is noted suggesting ileus or less likely obstruction. Follow-up radiographs recommended. Electronically Signed   By: Marijo Conception M.D.   On: 08/29/2020 12:38    Anti-infectives (From admission, onward)    None        Assessment/Plan Colonic Distension - CT scan  6/24 that showed stool in the rectum and distal sigmoid colon, causing partial functional obstruction of the colon.   - X-ray 7/6 showed colonic distention unable to rule out sigmoid volvulus.  - Patient has hx of Ogilvie's syndrome that required colonoscopy with successful decompression (~1529ml) by Dr. Alessandra Bevels on 01/19/2020  - No indication for emergency surgery. Agree with obtaining CT A/P to further evaluate - Keep NPO. Place NGT if develops n/v.  - Further recs to follow after CT.   FEN - NPO VTE - SCDs, Loveonx ID - None  - Per Primary -  Hx CAD Prior CVA w/ residual R sided weakness Wheelchair bound CKD3 DM2 HTN HLD Morbid obesity  Hx CHF (EF 65-70%, no wall motion abn, mod LVH, & G2DD on Echo 6/28)  Reviewed consult note by Dr. Watt Climes.  Agree with his assessment.  Will await CT scan this evening.  Will follow.  Hopefully can avoid urgent surgical intervention.  Armandina Gemma, MD [facility] Office: [phone]

## 2020-08-30 NOTE — Consult Note (Signed)
Reason for Consult: Abnormal x-ray Referring Physician: Hospital team  [name] is an 60 y.o. male.  HPI: Patient seen and examined in hospital computer chart reviewed and his case discussed with the hospital team and he actually looks and sounds better than his history sounds and he is denying any nausea vomiting or abdominal pain currently and did move his bowels yesterday but is unaware of his family history and his previous colonoscopy and decompression was reviewed and he has no other complaints and was admitted for CHF  Past Medical History:  Diagnosis Date   Arthritis    Bipolar affective ([place])    CHF (congestive heart failure) ([place])    Claustrophobia    Severe   Depression    Diabetes mellitus    uncontrolled    Heart murmur    Hypertension     Past Surgical History:  Procedure Laterality Date   BOWEL DECOMPRESSION N/A 01/19/2020   Procedure: BOWEL DECOMPRESSION;  Surgeon: Otis Brace, MD;  Location: [place];  Service: Gastroenterology;  Laterality: N/A;   COLONOSCOPY WITH PROPOFOL N/A 01/19/2020   Procedure: COLONOSCOPY WITH PROPOFOL;  Surgeon: Otis Brace, MD;  Location: [place];  Service: Gastroenterology;  Laterality: N/A;   KNEE SURGERY     b/l knees; pending total knee    RADIOLOGY WITH ANESTHESIA N/A 07/04/2020   Procedure: MRI WITH ANESTHESIA BRAIN WITHOUT CONTRAST AND HEAD WITHOUT CONTRAST;  Surgeon: Radiologist, Medication, MD;  Location: [place];  Service: Radiology;  Laterality: N/A;    Family History  Problem Relation Age of Onset   Diabetes Other    Cancer Mother        uterus; deceased    Heart attack Mother 32   Hypertension Other    Heart attack Father 7   Stroke Sister        12/2012 age 10 y.o    Social History:  reports that he has quit smoking. He has never used smokeless tobacco. He reports previous drug use. Drug: Cocaine. He reports that he does not drink alcohol.  Allergies:  Allergies  Allergen Reactions    Other Anaphylaxis, Itching and Swelling    Allergy to GRITS , unknown exactly the specific ingredient he is allergic too    Medications: I have reviewed the patient's current medications.  Results for orders placed or performed during the hospital encounter of 08/21/20 (from the past 48 hour(s))  Glucose, capillary     Status: Abnormal   Collection Time: 08/28/20  4:42 PM  Result Value Ref Range   Glucose-Capillary 161 (H) 70 - 99 mg/dL    Comment: Glucose reference range applies only to samples taken after fasting for at least 8 hours.  Glucose, capillary     Status: Abnormal   Collection Time: 08/28/20 10:01 PM  Result Value Ref Range   Glucose-Capillary 156 (H) 70 - 99 mg/dL    Comment: Glucose reference range applies only to samples taken after fasting for at least 8 hours.  Basic metabolic panel     Status: Abnormal   Collection Time: 08/29/20  4:05 AM  Result Value Ref Range   Sodium 135 135 - 145 mmol/L   Potassium 3.7 3.5 - 5.1 mmol/L   Chloride 104 98 - 111 mmol/L   CO2 24 22 - 32 mmol/L   Glucose, Bld 172 (H) 70 - 99 mg/dL    Comment: Glucose reference range applies only to samples taken after fasting for at least 8 hours.   BUN 43 (  H) 6 - 20 mg/dL   Creatinine, Ser 1.94 (H) 0.61 - 1.24 mg/dL   Calcium 8.3 (L) 8.9 - 10.3 mg/dL   GFR, Estimated 39 (L) >60 mL/min    Comment: (NOTE) Calculated using the CKD-EPI Creatinine Equation (2021)    Anion gap 7 5 - 15    Comment: Performed at [facility], [place] [address], [place], [place] [address]  Magnesium     Status: Abnormal   Collection Time: 08/29/20  4:05 AM  Result Value Ref Range   Magnesium 1.6 (L) 1.7 - 2.4 mg/dL    Comment: Performed at [facility], [place] [address], [place], [place] [address]  Glucose, capillary     Status: Abnormal   Collection Time: 08/29/20  7:54 AM  Result Value Ref Range   Glucose-Capillary 149 (H) 70 - 99 mg/dL    Comment: Glucose reference range  applies only to samples taken after fasting for at least 8 hours.  Glucose, capillary     Status: Abnormal   Collection Time: 08/29/20 11:33 AM  Result Value Ref Range   Glucose-Capillary 145 (H) 70 - 99 mg/dL    Comment: Glucose reference range applies only to samples taken after fasting for at least 8 hours.  Glucose, capillary     Status: Abnormal   Collection Time: 08/29/20  4:48 PM  Result Value Ref Range   Glucose-Capillary 115 (H) 70 - 99 mg/dL    Comment: Glucose reference range applies only to samples taken after fasting for at least 8 hours.  Glucose, capillary     Status: Abnormal   Collection Time: 08/29/20 10:51 PM  Result Value Ref Range   Glucose-Capillary 207 (H) 70 - 99 mg/dL    Comment: Glucose reference range applies only to samples taken after fasting for at least 8 hours.  CBC     Status: Abnormal   Collection Time: 08/30/20  3:53 AM  Result Value Ref Range   WBC 3.4 (L) 4.0 - 10.5 K/uL   RBC 3.35 (L) 4.22 - 5.81 MIL/uL   Hemoglobin 8.3 (L) 13.0 - 17.0 g/dL   HCT 27.4 (L) 39.0 - 52.0 %   MCV 81.8 80.0 - 100.0 fL   MCH 24.8 (L) 26.0 - 34.0 pg   MCHC 30.3 30.0 - 36.0 g/dL   RDW 15.3 11.5 - 15.5 %   Platelets 233 150 - 400 K/uL   nRBC 0.0 0.0 - 0.2 %    Comment: Performed at [facility], [place] [address], [place], [place] [address]  Basic metabolic panel     Status: Abnormal   Collection Time: 08/30/20  3:53 AM  Result Value Ref Range   Sodium 135 135 - 145 mmol/L   Potassium 3.8 3.5 - 5.1 mmol/L   Chloride 104 98 - 111 mmol/L   CO2 25 22 - 32 mmol/L   Glucose, Bld 155 (H) 70 - 99 mg/dL    Comment: Glucose reference range applies only to samples taken after fasting for at least 8 hours.   BUN 41 (H) 6 - 20 mg/dL   Creatinine, Ser 1.96 (H) 0.61 - 1.24 mg/dL   Calcium 8.3 (L) 8.9 - 10.3 mg/dL   GFR, Estimated 39 (L) >60 mL/min    Comment: (NOTE) Calculated using the CKD-EPI Creatinine Equation (2021)    Anion gap 6 5 - 15    Comment:  Performed at [facility], [place] [address], [place], [place] [address]  Magnesium  Status: None   Collection Time: 08/30/20  3:53 AM  Result Value Ref Range   Magnesium 1.8 1.7 - 2.4 mg/dL    Comment: Performed at [facility], [place] [address], [place], [place] [address]  Glucose, capillary     Status: Abnormal   Collection Time: 08/30/20  7:41 AM  Result Value Ref Range   Glucose-Capillary 107 (H) 70 - 99 mg/dL    Comment: Glucose reference range applies only to samples taken after fasting for at least 8 hours.  Glucose, capillary     Status: Abnormal   Collection Time: 08/30/20 11:40 AM  Result Value Ref Range   Glucose-Capillary 150 (H) 70 - 99 mg/dL    Comment: Glucose reference range applies only to samples taken after fasting for at least 8 hours.    DG Abd 1 View  Result Date: 08/30/2020 CLINICAL DATA:  Abdominal distention.  Pain. EXAM: ABDOMEN - 1 VIEW COMPARISON:  08/29/2020.  CT 08/18/2020. FINDINGS: Colonic distention again noted. Similar finding noted on prior studies including CT of 08/18/2020. The possibility of sigmoid volvulus cannot be excluded on today's exam and repeat CT of the abdomen and pelvis suggested. No free air identified. Right hemidiaphragm incompletely imaged. No acute bony abnormality. IMPRESSION: Colonic distention again noted. Similar findings noted on prior study including CT of 08/18/2020. The possibly of the sigmoid volvulus cannot be excluded on today's exam and repeat CT of the abdomen and pelvis suggested. These results will be called to the ordering clinician or representative by the Radiologist Assistant, and communication documented in the PACS or [employer]. Electronically Signed   By: Marcello Moores  Register   On: 08/30/2020 11:46   DG Abd 1 View  Result Date: 08/29/2020 CLINICAL DATA:  Abdominal pain. EXAM: ABDOMEN - 1 VIEW COMPARISON:  Jun 26, 2020. FINDINGS: Mild gaseous distension of large and small bowel is  noted suggesting ileus or less likely obstruction. No calcifications are noted IMPRESSION: Mild gaseous distention of large and small bowel is noted suggesting ileus or less likely obstruction. Follow-up radiographs recommended. Electronically Signed   By: Marijo Conception M.D.   On: 08/29/2020 12:38    Review of Systems negative except above Blood pressure (!) 137/94, pulse 89, temperature 97.7 F (36.5 C), temperature source Oral, resp. rate 12, height 5\' 8"  (1.727 m), weight (!) 144.8 kg, SpO2 95 %. Physical Exam vital signs stable afebrile no acute distress his abdomen is soft nontender he says this is his normal size x-rays reviewed potassium 3.8 white count 3.4 iron studies more compatible with chronic disease  Assessment/Plan: Multiple medical problems including probable colonic ileus and doubt volvulus Plan: We will allow clear liquids and increase MiraLAX and try to minimize pain medicine and increase ambulation and have potassium greater than 4 and if he gets worse consider a rectal tube first and will check on tomorrow and await CT scan results  [facility] E 08/30/2020, 3:07 PM

## 2020-08-31 DIAGNOSIS — N179 Acute kidney failure, unspecified: Secondary | ICD-10-CM | POA: Diagnosis not present

## 2020-08-31 DIAGNOSIS — I5033 Acute on chronic diastolic (congestive) heart failure: Secondary | ICD-10-CM | POA: Diagnosis not present

## 2020-08-31 LAB — CBC WITH DIFFERENTIAL/PLATELET
Abs Immature Granulocytes: 0.01 10*3/uL (ref 0.00–0.07)
Basophils Absolute: 0 10*3/uL (ref 0.0–0.1)
Basophils Relative: 1 %
Eosinophils Absolute: 0.4 10*3/uL (ref 0.0–0.5)
Eosinophils Relative: 13 %
HCT: 28.2 % — ABNORMAL LOW (ref 39.0–52.0)
Hemoglobin: 8.7 g/dL — ABNORMAL LOW (ref 13.0–17.0)
Immature Granulocytes: 0 %
Lymphocytes Relative: 34 %
Lymphs Abs: 1.2 10*3/uL (ref 0.7–4.0)
MCH: 24.9 pg — ABNORMAL LOW (ref 26.0–34.0)
MCHC: 30.9 g/dL (ref 30.0–36.0)
MCV: 80.6 fL (ref 80.0–100.0)
Monocytes Absolute: 0.5 10*3/uL (ref 0.1–1.0)
Monocytes Relative: 13 %
Neutro Abs: 1.4 10*3/uL — ABNORMAL LOW (ref 1.7–7.7)
Neutrophils Relative %: 39 %
Platelets: 232 10*3/uL (ref 150–400)
RBC: 3.5 MIL/uL — ABNORMAL LOW (ref 4.22–5.81)
RDW: 15.4 % (ref 11.5–15.5)
WBC: 3.5 10*3/uL — ABNORMAL LOW (ref 4.0–10.5)
nRBC: 0 % (ref 0.0–0.2)

## 2020-08-31 LAB — BASIC METABOLIC PANEL
Anion gap: 5 (ref 5–15)
BUN: 40 mg/dL — ABNORMAL HIGH (ref 6–20)
CO2: 26 mmol/L (ref 22–32)
Calcium: 8.3 mg/dL — ABNORMAL LOW (ref 8.9–10.3)
Chloride: 104 mmol/L (ref 98–111)
Creatinine, Ser: 1.67 mg/dL — ABNORMAL HIGH (ref 0.61–1.24)
GFR, Estimated: 47 mL/min — ABNORMAL LOW (ref >60)
Glucose, Bld: 172 mg/dL — ABNORMAL HIGH (ref 70–99)
Potassium: 4.3 mmol/L (ref 3.5–5.1)
Sodium: 135 mmol/L (ref 135–145)

## 2020-08-31 LAB — MAGNESIUM: Magnesium: 1.9 mg/dL (ref 1.7–2.4)

## 2020-08-31 LAB — GLUCOSE, CAPILLARY
Glucose-Capillary: 143 mg/dL — ABNORMAL HIGH (ref 70–99)
Glucose-Capillary: 132 mg/dL — ABNORMAL HIGH (ref 70–99)
Glucose-Capillary: 147 mg/dL — ABNORMAL HIGH (ref 70–99)
Glucose-Capillary: 206 mg/dL — ABNORMAL HIGH (ref 70–99)

## 2020-08-31 LAB — PHOSPHORUS: Phosphorus: 3.9 mg/dL (ref 2.5–4.6)

## 2020-08-31 NOTE — TOC Progression Note (Signed)
Transition of Care [facility]) - Progression Note    Patient Details  Name: [name] MRN: [id number] Date of Birth: Nov 11, 1960  Transition of Care [facility]) CM/SW Contact  Ross Ludwig, [place] Phone Number: 08/31/2020, 5:32 PM  Clinical Narrative:     Patient still not medically ready for discharge per physician.  CSW to continue to follow patient's progress throughout discharge planning.   Expected Discharge Plan: Assisted Living Barriers to Discharge: Continued Medical Work up  Expected Discharge Plan and Services Expected Discharge Plan: Assisted Living   Discharge Planning Services: CM Consult Post Acute Care Choice: [place] arrangements for the past 2 months: [place]                                       Social Determinants of Health (SDOH) Interventions    Readmission Risk Interventions Readmission Risk Prevention Plan 01/12/2020  Transportation Screening Complete  Palliative Care Screening Not Applicable  Some recent data might be hidden

## 2020-08-31 NOTE — Care Management Important Message (Signed)
Important Message  Patient Details IM Letter given to the Patient. Name: DOVID BARTKO MRN: [id number] Date of Birth: [birth year]   Medicare Important Message Given:  Yes     Kerin Salen 08/31/2020, 10:17 AM

## 2020-08-31 NOTE — Progress Notes (Signed)
[facility] Gastroenterology Progress Note  [name] 60 y.o. 04-Jan-1961  CC: Ileus   Subjective: Patient seen and examined at bedside.  Wants to eat regular diet.  Last bowel movement yesterday.  Passing flatus.  Denies nausea and vomiting.  ROS : Afebrile.  Negative for chest pain   Objective: Vital signs in last 24 hours: Vitals:   08/30/20 2114 08/31/20 0528  BP: 127/74 116/77  Pulse: 72 69  Resp: 18 18  Temp: 98 F (36.7 C) 98 F (36.7 C)  SpO2: 99% 100%    Physical Exam:  General:  Alert, cooperative, no distress, appears stated age  Head:  Normocephalic, without obvious abnormality, atraumatic  Eyes:  , EOM's intact,   Lungs:   No visible respiratory distress  Heart:  Regular rate and rhythm, S1, S2 normal  Abdomen:   Abdomen is mildly distended, bowel sounds present, no peritoneal signs.     Psych Mood and affect normal    Lab Results: Recent Labs    08/30/20 0353 08/31/20 0434  NA 135 135  K 3.8 4.3  CL 104 104  CO2 25 26  GLUCOSE 155* 172*  BUN 41* 40*  CREATININE 1.96* 1.67*  CALCIUM 8.3* 8.3*  MG 1.8 1.9  PHOS  --  3.9   No results for input(s): AST, ALT, ALKPHOS, BILITOT, PROT, ALBUMIN in the last 72 hours. Recent Labs    08/30/20 0353 08/31/20 0434  WBC 3.4* 3.5*  NEUTROABS  --  1.4*  HGB 8.3* 8.7*  HCT 27.4* 28.2*  MCV 81.8 80.6  PLT 233 232   No results for input(s): LABPROT, INR in the last 72 hours.    Assessment/Plan: -Abdominal distention most likely from colonic ileus.  CT yesterday negative for volvulus or obstruction. -Chronic anemia -History of CVA with residual right-sided weakness -History of coronary artery disease and chronic kidney disease  Recommendations ------------------------- -Advance diet to full liquid -Continue MiraLAX twice a day.  Patient refused last 2 dose of MiraLAX since yesterday evening.  Patient is willing to try it now. -Minimize narcotics.  Maintain potassium more than 4. -If no bowel  movement today, recommend fleets enema -GI will follow  Otis Brace MD, [place] 08/31/2020, 8:25 AM  Contact #  [phone]

## 2020-08-31 NOTE — Progress Notes (Addendum)
Progress Note    [name]  NGE:[id number] DOB: 09/14/1960  DOA: 08/21/2020 PCP: Roy Perna, NP      Brief Narrative:    Medical records reviewed and are as summarized below:  [name] is a 60 y.o. male PMH including chronic diastolic CHF, diabetes, morbid obesity, stroke presented with swelling, chest pain or shortness of breath.  Admitted for acute on chronic diastolic CHF.  Massive volume overload noted, cardiology consulted, continue diuresis, monitor renal function.      Assessment/Plan:   Principal Problem:   Acute on chronic heart failure with preserved ejection fraction (HCC) Active Problems:   Essential hypertension   Diabetic neuropathy (HCC)   Diabetes mellitus type 2, uncontrolled (HCC)   Microcytic anemia   CKD stage IIIa (HCC)   Morbid (severe) obesity due to excess calories ([place])   AKI (acute kidney injury) ([place])   CVA ([place]) with residual right-sided weakness   Pericardial effusion   Acute on chronic diastolic CHF (congestive heart failure) (HCC)    Body mass index is 48.07 kg/m.  (Morbid obesity)  Acute on chronic diastolic CHF, anasarca (as evidenced by lower extremity edema, abdominal wall edema, scrotal edema, abdominal distention): 2D echo showed EF estimated at 65 to 40%, grade 2 diastolic dysfunction.  Continue IV Lasix.  Monitor daily weight, urine output and BMP.  AKI on CKD stage IIIa: Creatinine is trending down.  Continue IV Lasix and monitor BMP  CT abdomen and pelvis did not show any evidence of bowel obstruction or sigmoid volvulus.  Gastroenterologist recommended full liquid diet.  However, patient refused for liquid diet and demanded to have a more solid food.  Soft diet has been ordered.  Hypokalemia: Improved.  Continue repletion while on IV Lasix.  Right upper extremity swelling: Venous duplex of the upper extremity did not show any evidence of DVT.  Recent diarrhea: Resolved.  Stool for C. difficile and GI  panel were negative.  History of stroke with right-sided weakness: Continue aspirin and Lipitor.  PT recommends discharge to SNF.  Pericardial effusion: Stable with no signs of cardiac tamponade.  Other comorbidities include depression, BPH, type II DM, hypoalbuminemia, anemia of chronic disease    Diet Order             DIET SOFT Room service appropriate? Yes; Fluid consistency: Thin  Diet effective now                      Consultants: Youth worker Cardiologist  Procedures: None    Medications:    amLODipine  10 mg Oral Daily   aspirin  81 mg Oral Daily   atorvastatin  80 mg Oral QHS   carvedilol  12.5 mg Oral BID WC   enoxaparin (LOVENOX) injection  40 mg Subcutaneous Q24H   furosemide  80 mg Intravenous BID   insulin aspart  0-9 Units Subcutaneous TID WC   insulin aspart  3 Units Subcutaneous TID WC   insulin glargine  20 Units Subcutaneous QHS   isosorbide-hydrALAZINE  1 tablet Oral TID   pantoprazole  40 mg Oral Daily   polyethylene glycol  17 g Oral Q8H   potassium chloride  20 mEq Oral BID   pregabalin  75 mg Oral BID   saccharomyces boulardii  250 mg Oral BID   sodium chloride flush  3 mL Intravenous Q12H   tamsulosin  0.8 mg Oral Daily   Continuous Infusions:  sodium chloride  250 mL (08/30/20 1403)     Anti-infectives (From admission, onward)    None              Family Communication/Anticipated D/C date and plan/Code Status   DVT prophylaxis: enoxaparin (LOVENOX) injection 40 mg Start: 08/22/20 1000     Code Status: Full Code  Family Communication: None Disposition Plan:    Status is: Inpatient  Remains inpatient appropriate because:IV treatments appropriate due to intensity of illness or inability to take PO and Inpatient level of care appropriate due to severity of illness  Dispo: The patient is from: Home              Anticipated d/c is to: SNF              Patient currently is not medically  stable to d/c.   Difficult to place patient No           Subjective:   Interval events noted.  No abdominal pain, vomiting or diarrhea.  No shortness of breath or chest pain.  He still has swelling in the thighs and legs.  Objective:    Vitals:   08/30/20 0632 08/30/20 1319 08/30/20 2114 08/31/20 0528  BP: 132/88 (!) 137/94 127/74 116/77  Pulse: 70 89 72 69  Resp:  12 18 18   Temp: 98.4 F (36.9 C) 97.7 F (36.5 C) 98 F (36.7 C) 98 F (36.7 C)  TempSrc: Oral Oral Oral Oral  SpO2: 99% 95% 99% 100%  Weight:    (!) 143.4 kg  Height:       No data found.   Intake/Output Summary (Last 24 hours) at 08/31/2020 0913 Last data filed at 08/31/2020 0500 Gross per 24 hour  Intake 718 ml  Output 2850 ml  Net -2132 ml   Filed Weights   08/26/20 0444 08/29/20 0434 08/31/20 0528  Weight: (!) 144.4 kg (!) 144.8 kg (!) 143.4 kg    Exam:  GEN: NAD SKIN: Warm and dry EYES: No pallor or icterus ENT: MMM CV: RRR PULM: CTA B ABD: soft, obese/distended, NT, +BS CNS: AAO x 3, non focal EXT: Bilateral lower extremity edema from thighs to the feet (2+). GU: Scrotal edema with mild tenderness.  No erythema noted       Data Reviewed:   I have personally reviewed following labs and imaging studies:  Labs: Labs show the following:   Basic Metabolic Panel: Recent Labs  Lab 08/27/20 0408 08/28/20 0504 08/29/20 0405 08/30/20 0353 08/31/20 0434  NA 136 137 135 135 135  K 3.6 3.4* 3.7 3.8 4.3  CL 105 105 104 104 104  CO2 25 26 24 25 26   GLUCOSE 114* 116* 172* 155* 172*  BUN 36* 40* 43* 41* 40*  CREATININE 1.93* 1.97* 1.94* 1.96* 1.67*  CALCIUM 8.2* 8.3* 8.3* 8.3* 8.3*  MG  --   --  1.6* 1.8 1.9  PHOS  --   --   --   --  3.9   GFR Estimated Creatinine Clearance: 66.3 mL/min (A) (by C-G formula based on SCr of 1.67 mg/dL (H)). Liver Function Tests: No results for input(s): AST, ALT, ALKPHOS, BILITOT, PROT, ALBUMIN in the last 168 hours. No results for input(s):  LIPASE, AMYLASE in the last 168 hours. No results for input(s): AMMONIA in the last 168 hours. Coagulation profile No results for input(s): INR, PROTIME in the last 168 hours.  CBC: Recent Labs  Lab 08/30/20 0353 08/31/20 0434  WBC 3.4* 3.5*  NEUTROABS  --  1.4*  HGB 8.3* 8.7*  HCT 27.4* 28.2*  MCV 81.8 80.6  PLT 233 232   Cardiac Enzymes: No results for input(s): CKTOTAL, CKMB, CKMBINDEX, TROPONINI in the last 168 hours. BNP (last 3 results) No results for input(s): PROBNP in the last 8760 hours. CBG: Recent Labs  Lab 08/30/20 0741 08/30/20 1140 08/30/20 1626 08/30/20 2119 08/31/20 0738  GLUCAP 107* 150* 146* 215* 143*   D-Dimer: No results for input(s): DDIMER in the last 72 hours. Hgb A1c: No results for input(s): HGBA1C in the last 72 hours. Lipid Profile: No results for input(s): CHOL, HDL, LDLCALC, TRIG, CHOLHDL, LDLDIRECT in the last 72 hours. Thyroid function studies: No results for input(s): TSH, T4TOTAL, T3FREE, THYROIDAB in the last 72 hours.  Invalid input(s): FREET3 Anemia work up: No results for input(s): VITAMINB12, FOLATE, FERRITIN, TIBC, IRON, RETICCTPCT in the last 72 hours. Sepsis Labs: Recent Labs  Lab 08/30/20 0353 08/31/20 0434  WBC 3.4* 3.5*    Microbiology Recent Results (from the past 240 hour(s))  Resp Panel by RT-PCR (Flu A&B, Covid) Nasopharyngeal Swab     Status: None   Collection Time: 08/21/20 11:31 PM   Specimen: Nasopharyngeal Swab; Nasopharyngeal(NP) swabs in vial transport medium  Result Value Ref Range Status   SARS Coronavirus 2 by RT PCR NEGATIVE NEGATIVE Final    Comment: (NOTE) SARS-CoV-2 target nucleic acids are NOT DETECTED.  The SARS-CoV-2 RNA is generally detectable in upper respiratory specimens during the acute phase of infection. The lowest concentration of SARS-CoV-2 viral copies this assay can detect is 138 copies/mL. A negative result does not preclude SARS-Cov-2 infection and should not be used as the  sole basis for treatment or other patient management decisions. A negative result may occur with  improper specimen collection/handling, submission of specimen other than nasopharyngeal swab, presence of viral mutation(s) within the areas targeted by this assay, and inadequate number of viral copies(<138 copies/mL). A negative result must be combined with clinical observations, patient history, and epidemiological information. The expected result is Negative.  Fact Sheet for Patients:  EntrepreneurPulse.com.au  Fact Sheet for Healthcare Providers:  IncredibleEmployment.be  This test is no t yet approved or cleared by the [place] FDA and  has been authorized for detection and/or diagnosis of SARS-CoV-2 by FDA under an Emergency Use Authorization (EUA). This EUA will remain  in effect (meaning this test can be used) for the duration of the COVID-19 declaration under Section 564(b)(1) of the Act, 21 U.S.C.section 360bbb-3(b)(1), unless the authorization is terminated  or revoked sooner.       Influenza A by PCR NEGATIVE NEGATIVE Final   Influenza B by PCR NEGATIVE NEGATIVE Final    Comment: (NOTE) The Xpert Xpress SARS-CoV-2/FLU/RSV plus assay is intended as an aid in the diagnosis of influenza from Nasopharyngeal swab specimens and should not be used as a sole basis for treatment. Nasal washings and aspirates are unacceptable for Xpert Xpress SARS-CoV-2/FLU/RSV testing.  Fact Sheet for Patients: EntrepreneurPulse.com.au  Fact Sheet for Healthcare Providers: IncredibleEmployment.be  This test is not yet approved or cleared by the [place] FDA and has been authorized for detection and/or diagnosis of SARS-CoV-2 by FDA under an Emergency Use Authorization (EUA). This EUA will remain in effect (meaning this test can be used) for the duration of the COVID-19 declaration under Section 564(b)(1) of the  Act, 21 U.S.C. section 360bbb-3(b)(1), unless the authorization is terminated or revoked.  Performed at [facility], [place] [address] [place], [place] [address]  MRSA PCR Screening     Status: None   Collection Time: 08/22/20  5:39 PM  Result Value Ref Range Status   MRSA by PCR NEGATIVE NEGATIVE Final    Comment:        The GeneXpert MRSA Assay (FDA approved for NASAL specimens only), is one component of a comprehensive MRSA colonization surveillance program. It is not intended to diagnose MRSA infection nor to guide or monitor treatment for MRSA infections. Performed at [facility], [place] [address], [place], [place] [address]   C Difficile Quick Screen w PCR reflex     Status: None   Collection Time: 08/23/20  4:57 PM   Specimen: STOOL  Result Value Ref Range Status   C Diff antigen NEGATIVE NEGATIVE Final   C Diff toxin NEGATIVE NEGATIVE Final   C Diff interpretation No C. difficile detected.  Final    Comment: Performed at [facility], [place] [address] [place], [place] [address]  Gastrointestinal Panel by PCR , Stool     Status: None   Collection Time: 08/23/20  4:58 PM   Specimen: STOOL  Result Value Ref Range Status   Campylobacter species NOT DETECTED NOT DETECTED Final   Plesimonas shigelloides NOT DETECTED NOT DETECTED Final   Salmonella species NOT DETECTED NOT DETECTED Final   Yersinia enterocolitica NOT DETECTED NOT DETECTED Final   Vibrio species NOT DETECTED NOT DETECTED Final   Vibrio cholerae NOT DETECTED NOT DETECTED Final   Enteroaggregative E coli (EAEC) NOT DETECTED NOT DETECTED Final   Enteropathogenic E coli (EPEC) NOT DETECTED NOT DETECTED Final   Enterotoxigenic E coli (ETEC) NOT DETECTED NOT DETECTED Final   Shiga like toxin producing E coli (STEC) NOT DETECTED NOT DETECTED Final   Shigella/Enteroinvasive E coli (EIEC) NOT DETECTED NOT DETECTED Final   Cryptosporidium NOT DETECTED NOT  DETECTED Final   Cyclospora cayetanensis NOT DETECTED NOT DETECTED Final   Entamoeba histolytica NOT DETECTED NOT DETECTED Final   Giardia lamblia NOT DETECTED NOT DETECTED Final   Adenovirus F40/41 NOT DETECTED NOT DETECTED Final   Astrovirus NOT DETECTED NOT DETECTED Final   Norovirus GI/GII NOT DETECTED NOT DETECTED Final   Rotavirus A NOT DETECTED NOT DETECTED Final   Sapovirus (I, II, IV, and V) NOT DETECTED NOT DETECTED Final    Comment: Performed at [facility], [place] [place], [place] [address]    Procedures and diagnostic studies:  CT ABDOMEN PELVIS WO CONTRAST  Result Date: 08/30/2020 CLINICAL DATA:  Abdominal swelling. Query sigmoid volvulus. Follow-up scan from 06/24. EXAM: CT ABDOMEN AND PELVIS WITHOUT CONTRAST TECHNIQUE: Multidetector CT imaging of the abdomen and pelvis was performed following the standard protocol without IV contrast. COMPARISON:  08/18/2020 FINDINGS: Lower chest: Minimal bilateral pleural effusions. Motion artifact limits evaluation of the lungs but there appears to be some airspace disease, likely edema. Cardiac enlargement with small pericardial effusion. Hepatobiliary: Cholelithiasis with large stone in the gallbladder neck, unchanged. No focal liver lesions. Pancreas: Unremarkable. No pancreatic ductal dilatation or surrounding inflammatory changes. Spleen: Normal in size without focal abnormality. Adrenals/Urinary Tract: Adrenal glands are unremarkable. Kidneys are normal, without renal calculi, focal lesion, or hydronephrosis. Bladder is unremarkable. Stomach/Bowel: Stomach, small bowel, and colon are not abnormally distended. There is gas and stool in the colon but no evidence of sigmoid volvulus currently. Appendix is not identified. Vascular/Lymphatic: Aortic atherosclerosis. No enlarged abdominal or pelvic lymph nodes. Reproductive: Prostate is unremarkable. Other: Small amount of free fluid in the  pelvis, likely ascites. No free air.  Extensive edema throughout the subcutaneous fat. Musculoskeletal: Degenerative changes in the spine. No destructive bone lesions. Examination is technically limited due to body habitus of the patient. IMPRESSION: 1. No evidence of bowel obstruction or sigmoid volvulus. 2. Cholelithiasis. 3. Small bilateral pleural effusions with probable edema in the lung bases. Cardiac enlargement with small pericardial effusion. 4. Diffuse soft tissue edema throughout the subcutaneous fat. 5. Aortic atherosclerosis. Electronically Signed   By: Lucienne Capers M.D.   On: 08/30/2020 19:36   DG Abd 1 View  Result Date: 08/30/2020 CLINICAL DATA:  Abdominal distention.  Pain. EXAM: ABDOMEN - 1 VIEW COMPARISON:  08/29/2020.  CT 08/18/2020. FINDINGS: Colonic distention again noted. Similar finding noted on prior studies including CT of 08/18/2020. The possibility of sigmoid volvulus cannot be excluded on today's exam and repeat CT of the abdomen and pelvis suggested. No free air identified. Right hemidiaphragm incompletely imaged. No acute bony abnormality. IMPRESSION: Colonic distention again noted. Similar findings noted on prior study including CT of 08/18/2020. The possibly of the sigmoid volvulus cannot be excluded on today's exam and repeat CT of the abdomen and pelvis suggested. These results will be called to the ordering clinician or representative by the Radiologist Assistant, and communication documented in the PACS or [employer]. Electronically Signed   By: Marcello Moores  Register   On: 08/30/2020 11:46   DG Abd 1 View  Result Date: 08/29/2020 CLINICAL DATA:  Abdominal pain. EXAM: ABDOMEN - 1 VIEW COMPARISON:  Jun 26, 2020. FINDINGS: Mild gaseous distension of large and small bowel is noted suggesting ileus or less likely obstruction. No calcifications are noted IMPRESSION: Mild gaseous distention of large and small bowel is noted suggesting ileus or less likely obstruction. Follow-up radiographs recommended.  Electronically Signed   By: Marijo Conception M.D.   On: 08/29/2020 12:38               LOS: 9 days   Ewin Rehberg  [facility]   Pager on [email or web address]. If 7PM-7AM, please contact night-coverage at [email or web address]     08/31/2020, 9:13 AM

## 2020-08-31 NOTE — Progress Notes (Signed)
Patient refused his midnight dose of miralax. Notified provider on call.

## 2020-08-31 NOTE — Progress Notes (Signed)
CT results noted, no evidence of SBO or sigmoid volvulus. GI following. No indications for surgical intervention at this time, general surgery will sign off. Please call if we can be of assistance.  Norm Parcel, [facility] Surgery 08/31/2020, 6:55 AM Please see Amion for pager number during day hours 7:00am-4:30pm

## 2020-09-01 DIAGNOSIS — N179 Acute kidney failure, unspecified: Secondary | ICD-10-CM | POA: Diagnosis not present

## 2020-09-01 DIAGNOSIS — I5033 Acute on chronic diastolic (congestive) heart failure: Secondary | ICD-10-CM | POA: Diagnosis not present

## 2020-09-01 LAB — BASIC METABOLIC PANEL
Anion gap: 7 (ref 5–15)
BUN: 38 mg/dL — ABNORMAL HIGH (ref 6–20)
CO2: 25 mmol/L (ref 22–32)
Calcium: 8.6 mg/dL — ABNORMAL LOW (ref 8.9–10.3)
Chloride: 104 mmol/L (ref 98–111)
Creatinine, Ser: 1.69 mg/dL — ABNORMAL HIGH (ref 0.61–1.24)
GFR, Estimated: 46 mL/min — ABNORMAL LOW (ref >60)
Glucose, Bld: 160 mg/dL — ABNORMAL HIGH (ref 70–99)
Potassium: 4.2 mmol/L (ref 3.5–5.1)
Sodium: 136 mmol/L (ref 135–145)

## 2020-09-01 LAB — GLUCOSE, CAPILLARY
Glucose-Capillary: 153 mg/dL — ABNORMAL HIGH (ref 70–99)
Glucose-Capillary: 143 mg/dL — ABNORMAL HIGH (ref 70–99)
Glucose-Capillary: 163 mg/dL — ABNORMAL HIGH (ref 70–99)
Glucose-Capillary: 204 mg/dL — ABNORMAL HIGH (ref 70–99)

## 2020-09-01 LAB — MAGNESIUM: Magnesium: 1.8 mg/dL (ref 1.7–2.4)

## 2020-09-01 NOTE — Progress Notes (Signed)
[facility] Gastroenterology Progress Note  [name] 60 y.o. 05/05/1960  CC: Ileus   Subjective: Patient seen and examined at bedside.  Feeling better now.  Currently on soft diet.  Had bowel movement this morning.  Denies nausea and vomiting.  ROS : Afebrile.  Negative for chest pain   Objective: Vital signs in last 24 hours: Vitals:   08/31/20 2021 09/01/20 0529  BP: 125/76 129/82  Pulse: 78 76  Resp: 20 20  Temp: 97.8 F (36.6 C) 98 F (36.7 C)  SpO2: 99% 100%    Physical Exam:  General:  Alert, cooperative, no distress, appears stated age  Head:  Normocephalic, without obvious abnormality, atraumatic  Eyes:  , EOM's intact,   Lungs:   No visible respiratory distress  Heart:  Regular rate and rhythm, S1, S2 normal  Abdomen:   Abdomen is mildly distended, bowel sounds present, no peritoneal signs.     Psych Mood and affect normal    Lab Results: Recent Labs    08/31/20 0434 09/01/20 0759  NA 135 136  K 4.3 4.2  CL 104 104  CO2 26 25  GLUCOSE 172* 160*  BUN 40* 38*  CREATININE 1.67* 1.69*  CALCIUM 8.3* 8.6*  MG 1.9 1.8  PHOS 3.9  --    No results for input(s): AST, ALT, ALKPHOS, BILITOT, PROT, ALBUMIN in the last 72 hours. Recent Labs    08/30/20 0353 08/31/20 0434  WBC 3.4* 3.5*  NEUTROABS  --  1.4*  HGB 8.3* 8.7*  HCT 27.4* 28.2*  MCV 81.8 80.6  PLT 233 232   No results for input(s): LABPROT, INR in the last 72 hours.    Assessment/Plan: -Abdominal distention most likely from colonic ileus.  CT  negative for volvulus or obstruction. -Chronic anemia -History of CVA with residual right-sided weakness -History of coronary artery disease and chronic kidney disease  Recommendations ------------------------- -Advance diet as tolerated -Continue MiraLAX twice a day as needed. -Minimize narcotics.  Maintain potassium more than 4. -GI will sign off.  Call us back if needed  Otis Brace MD, [place] 09/01/2020, 8:32 AM  Contact #   [phone]

## 2020-09-01 NOTE — Plan of Care (Signed)
Patient needs reinforcement on CHF management.

## 2020-09-01 NOTE — TOC Progression Note (Signed)
Transition of Care [facility]) - Progression Note    Patient Details  Name: SALIL RAINERI MRN: [id number] Date of Birth: Apr 23, 1960  [facility] of [facility]) CM/SW Contact  Ross Ludwig, [place] Phone Number: 09/01/2020, 3:17 PM  Clinical Narrative:     Patient's clinicals re faxed out waiting for bed offers.  Patient will need insurance authorization before he is ready for discharge.   Expected Discharge Plan: Assisted Living Barriers to Discharge: Continued Medical Work up  Expected Discharge Plan and Services Expected Discharge Plan: Assisted Living   Discharge Planning Services: CM Consult Post Acute Care Choice: [place] arrangements for the past 2 months: [place]                                       Social Determinants of Health (SDOH) Interventions    Readmission Risk Interventions Readmission Risk Prevention Plan 01/12/2020  Transportation Screening Complete  Palliative Care Screening Not Applicable  Some recent data might be hidden

## 2020-09-01 NOTE — Progress Notes (Signed)
Subjective:  Breathing okay He is now sitting up with physical therapy Having bowel movements  Objective:  Vital Signs in the last 24 hours: Temp:  [97.8 F (36.6 C)-98 F (36.7 C)] 98 F (36.7 C) (07/08 0529) Pulse Rate:  [75-78] 75 (07/08 0859) Resp:  [20] 20 (07/08 0529) BP: (125-137)/(76-83) 137/83 (07/08 0859) SpO2:  [99 %-100 %] 100 % (07/08 0529) Weight:  [143.5 kg] 143.5 kg (07/08 0500)  Intake/Output from previous day: 07/07 0701 - 07/08 0700 In: 920 [P.O.:920] Out: 3450 [Urine:3450]  Physical Exam Vitals and nursing note reviewed.  Constitutional:      General: He is not in acute distress.    Appearance: He is well-developed.  HENT:     Head: Normocephalic and atraumatic.  Eyes:     Conjunctiva/sclera: Conjunctivae normal.     Pupils: Pupils are equal, round, and reactive to light.  Neck:     Vascular: No JVD.  Cardiovascular:     Rate and Rhythm: Normal rate and regular rhythm.     Pulses: Intact distal pulses.     Heart sounds: No murmur heard. Pulmonary:     Effort: Pulmonary effort is normal.     Breath sounds: Normal breath sounds. No wheezing or rales.  Abdominal:     General: Bowel sounds are normal.     Palpations: Abdomen is soft.     Tenderness: There is no rebound.  Musculoskeletal:        General: Swelling (Right arm larger than left arm) present. No tenderness. Normal range of motion.     Right lower leg: Edema (2+) present.     Left lower leg: Edema (2+) present.  Lymphadenopathy:     Cervical: No cervical adenopathy.  Skin:    General: Skin is warm and dry.     Comments: Poor toenail hygiene  Neurological:     Mental Status: He is alert and oriented to person, place, and time.     Cranial Nerves: No cranial nerve deficit.     Comments: RLE weakness      Lab Results: BMP Recent Labs    08/30/20 0353 08/31/20 0434 09/01/20 0759  NA 135 135 136  K 3.8 4.3 4.2  CL 104 104 104  CO2 25 26 25   GLUCOSE 155* 172* 160*  BUN 41*  40* 38*  CREATININE 1.96* 1.67* 1.69*  CALCIUM 8.3* 8.3* 8.6*  GFRNONAA 39* 47* 46*     CBC Recent Labs  Lab 08/31/20 0434  WBC 3.5*  RBC 3.50*  HGB 8.7*  HCT 28.2*  PLT 232  MCV 80.6  MCH 24.9*  MCHC 30.9  RDW 15.4  LYMPHSABS 1.2  MONOABS 0.5  EOSABS 0.4  BASOSABS 0.0     HEMOGLOBIN A1C Lab Results  Component Value Date   HGBA1C 9.1 (H) 06/25/2020   MPG 214.47 06/25/2020    Cardiac Panel (last 3 results) Recent Labs    12/31/19 2122 01/02/20 0750  CKTOTAL 970* 568*     BNP (last 3 results) Recent Labs    08/16/20 1025 08/18/20 0123 08/21/20 1801  BNP 245.3* 139.2* 226.2*     TSH Recent Labs    01/01/20 0849 01/18/20 0517 06/25/20 1244  TSH 1.354 2.317 1.714     Lipid Panel     Component Value Date/Time   CHOL 305 (H) 06/25/2020 0711   TRIG 90 06/25/2020 0711   HDL 63 06/25/2020 0711   CHOLHDL 4.8 06/25/2020 0711   VLDL 18 06/25/2020 0711  [place] 224 (H) 06/25/2020 0711     Hepatic Function Panel Recent Labs    01/09/20 1027 01/14/20 0507 06/27/20 0301 06/30/20 0039 07/11/20 1849 08/05/20 1932 08/21/20 1646  PROT 6.9   < > 5.3*   < > 6.6 7.0 6.6  ALBUMIN 2.9*   < > 1.6*   < > 2.4* 2.5* 2.4*  AST 49*   < > 13*   < > 14* 17 18  ALT 35   < > 11   < > 13 15 14   ALKPHOS 94   < > 81   < > 74 97 88  BILITOT 0.6   < > <0.1*   < > 0.4 0.5 0.6  BILIDIR <0.1  --  <0.1  --   --   --   --   IBILI NOT CALCULATED  --  NOT CALCULATED  --   --   --   --    < > = values in this interval not displayed.     Cardiac Studies:  EKG 08/21/2020: Sinus rhythm Anteroseptal infarct, old Nonspecific T abnormalities, lateral leads  Echocardiogram 08/24/2020:  1. Left ventricular ejection fraction, by estimation, is 65 to 70%. The  left ventricle has normal function. The left ventricle has no regional  wall motion abnormalities. There is moderate concentric left ventricular  hypertrophy. Left ventricular  diastolic parameters are  consistent with Grade II diastolic dysfunction  (pseudonormalization). Elevated left atrial pressure.   2. Left atrial size was moderately dilated.   3. A small pericardial effusion is present. The pericardial effusion is  mostly localized near the right ventricle. There is no evidence of cardiac  tamponade.   4. Disproportionately severe right coronary cusp calcification is seen.  The aortic valve is tricuspid. There is moderate calcification of the  aortic valve. There is moderate thickening of the aortic valve. Aortic  valve regurgitation is mild. Mild aortic   valve stenosis.   5. Tricuspid regurgitation signal is inadequate for assessing PA  pressure.   6. The inferior vena cava is dilated in size with <50% respiratory  variability, suggesting right atrial pressure of 15 mmHg.   Assessment & Recommendations:  60 year old African-American male with  uncontrolled hypertension, type 2 diabetes mellitus, h/o stroke, CAD, HFpEF, ileus  HFpEF: Acute on chronic.  Primarily triggered by hypertensive urgency and poor dietary compliance. He remains volume overloaded in spite of being -10 L. Continue IV lasix 80 mg bid while inpatient. When he is dishcarged back to his facility, switch to torsemide 40 mg bid When he is able to tolerate and absorb PO, can add spironolactone 25 mg daily.  Strongly encourage increased mobility, and physical therapy, if possible.  Hypertension: Controlled  AKI/CKD:  Likely cardiorenal syndrome, along with intrinsic renal disease. Cr improving  We will peripherally follow the patient   Nigel Mormon, MD Pager: [phone] Office: [phone]

## 2020-09-01 NOTE — Progress Notes (Signed)
Physical Therapy Treatment Patient Details Name: [name] MRN: [id number] DOB: Dec 12, 1960 Today's Date: 09/01/2020    History of Present Illness [name] is a 60 year old man PMH including diastolic CHF, diabetes, morbid obesity, stroke presented with swelling, chest pain or shortness of breath.  Admitted for acute on chronic diastolic CHF.  Massive volume overload noted, cardiology consulted, continue diuresis, monitor renal function.    PT Comments    Pt appears even bigger than last PT session esp ABD and R UE.  Assisted with side to side rolling with nursing staff due to large BM.  Pt c/o pain/discomfort however he is unable to self assist much.  Total Assist for side side to side rolling pt 0%.  Assisted with transitioning pt to EOB was difficult.  General bed mobility comments: pt requires [place] Assist + 2 for all bed mobility.  Sat EOB x 4 min before c/o dizziness.  All vital WNL.  Present with some anxiety.  Difficulty self achieve upright sitting posture/balance due to ABD girth.  Required + 3 assist to get him back to bed and positioned to comfort.    Follow Up Recommendations  SNF     Equipment Recommendations  None recommended by PT    Recommendations for Other Services       Precautions / Restrictions Precautions Precaution Comments: scrotal, R UE  and LE edema painful    Mobility  Bed Mobility Overal bed mobility: Needs Assistance Bed Mobility: Rolling;Supine to Sit;Sit to Supine Rolling: Total assist;+2 for physical assistance;+2 for safety/equipment   Supine to sit: Total assist;+2 for physical assistance;+2 for safety/equipment Sit to supine: Total assist;+2 for physical assistance;+2 for safety/equipment   General bed mobility comments: pt requires [place] Assist + 2 for all bed mobility.  Sat EOB x 4 min before c/o dizziness.  All vital WNL.  Present with some anxiety.  Difficulty self achieve upright sitting posture/balance due to ABD girth.     Transfers                 General transfer comment: wanted to attempt sit to stand with [place] stedy however unable due to pt's BMI/ABD girth.  wanted to attempt sit to stand using back of transport wc however unable as pt started of c/o dizziness then his anxiety increased.  So assisted back to supine in bed + 3 assist.  Ambulation/Gait                 Stairs             Wheelchair Mobility    Modified Rankin (Stroke Patients Only)       Balance                                            Cognition Arousal/Alertness: Awake/alert                                     General Comments: AxO x 3 present with some anxiety and control issues of how he does things.  Also present with poor medical insight.      Exercises      General Comments        Pertinent Vitals/Pain Pain Assessment: Faces Faces Pain Scale: Hurts little more Pain Location: scrotum and bil LEs with  activity Pain Descriptors / Indicators: Discomfort;Guarding Pain Intervention(s): Monitored during session    Home Living                      Prior Function            PT Goals (current goals can now be found in the care plan section) Progress towards PT goals: Progressing toward goals    Frequency    Min 2X/week      PT Plan Current plan remains appropriate    Co-evaluation              AM-PAC PT "6 Clicks" Mobility   Outcome Measure  Help needed turning from your back to your side while in a flat bed without using bedrails?: Total Help needed moving from lying on your back to sitting on the side of a flat bed without using bedrails?: Total Help needed moving to and from a bed to a chair (including a wheelchair)?: Total Help needed standing up from a chair using your arms (e.g., wheelchair or bedside chair)?: Total Help needed to walk in hospital room?: Total Help needed climbing 3-5 steps with a railing? : Total 6 Click  Score: 6    End of Session Equipment Utilized During Treatment: Gait belt Activity Tolerance: Patient limited by fatigue;Other (comment) (dizziness) Patient left: in bed;with call bell/phone within reach;with bed alarm set Nurse Communication: Mobility status;Need for lift equipment PT Visit Diagnosis: Muscle weakness (generalized) (M62.81);Other abnormalities of gait and mobility (R26.89)     Time: 1140-1205 PT Time Calculation (min) (ACUTE ONLY): 25 min  Charges:  $Therapeutic Activity: 23-37 mins                     {Farrah Skoda  PTA Acute  Rehabilitation Services Pager      [phone] Office      [phone]

## 2020-09-01 NOTE — Progress Notes (Signed)
Progress Note    [name]  YYT:[id number] DOB: 08-18-1960  DOA: 08/21/2020 PCP: [name], NP      Brief Narrative:    Medical records reviewed and are as summarized below:  [name] is a 60 y.o. male PMH including chronic diastolic CHF, diabetes, morbid obesity, stroke presented with swelling, chest pain or shortness of breath.  He was admitted for acute on chronic diastolic CHF with anasarca.    He was treated with IV Lasix.  He also had AKI and creatinine improved with IV diuresis.  He developed colonic ileus that improved with conservative management.  PT and OT recommend discharge to SNF.     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  (Morbid obesity)  Acute on chronic diastolic CHF, anasarca (as evidenced by lower extremity edema, abdominal wall edema, scrotal edema, abdominal distention): 2D echo showed EF estimated at 65 to 27%, grade 2 diastolic dysfunction.  Continue IV Lasix.  Monitor daily weight, BMP and urine output.    AKI on CKD stage IIIa: Improved.  Creatinine is stable.   Ileus: CT abdomen and pelvis on 08/31/2020 did not show any evidence of bowel obstruction or sigmoid volvulus.  Advance diet from soft diet to regular diet.  GI has signed off.  Hypokalemia: Improved.  Continue repletion while on IV Lasix.  Right upper extremity swelling: Venous duplex of the upper extremity did not show any evidence of DVT.  Recent diarrhea: Resolved.  Stool for  C. difficile and GI panel were negative.  History of stroke with right-sided weakness: Continue aspirin and Lipitor.  PT recommends discharge to SNF.  Pericardial effusion: Stable with no signs of cardiac tamponade.  Other comorbidities include depression, BPH, type II DM, hypoalbuminemia, anemia of chronic disease    Diet Order             DIET SOFT Room service appropriate? Yes; Fluid consistency: Thin  Diet effective now                      Consultants: Youth worker Cardiologist  Procedures: None    Medications:    amLODipine  10 mg Oral Daily   aspirin  81 mg Oral Daily   atorvastatin  80 mg Oral QHS   carvedilol  12.5 mg Oral BID WC   enoxaparin (LOVENOX) injection  40 mg Subcutaneous Q24H   furosemide  80 mg Intravenous BID   insulin aspart  0-9 Units Subcutaneous TID WC   insulin aspart  3 Units Subcutaneous TID WC   insulin glargine  20 Units Subcutaneous QHS   isosorbide-hydrALAZINE  1 tablet Oral TID   pantoprazole  40 mg Oral Daily   polyethylene glycol  17 g Oral Q8H   potassium chloride  20 mEq Oral BID   pregabalin  75 mg Oral BID   saccharomyces boulardii  250 mg Oral BID   sodium chloride flush  3 mL Intravenous  Q12H   tamsulosin  0.8 mg Oral Daily   Continuous Infusions:  sodium chloride 250 mL (08/30/20 1403)     Anti-infectives (From admission, onward)    None              Family Communication/Anticipated D/C date and plan/Code Status   DVT prophylaxis: enoxaparin (LOVENOX) injection 40 mg Start: 08/22/20 1000     Code Status: Full Code  Family Communication: None Disposition Plan:    Status is: Inpatient  Remains inpatient appropriate because:IV treatments appropriate due to intensity of illness or inability to take PO and Inpatient level of care appropriate due to severity of illness  Dispo: The patient is from: Home              Anticipated d/c is to: SNF              Patient  currently is not medically stable to d/c.   Difficult to place patient No           Subjective:   Interval events noted.  He moved his bowels today.  He still has significant swelling in the thighs and legs.  No vomiting or abdominal pain.  No shortness of breath or chest pain.  Objective:    Vitals:   08/31/20 2021 09/01/20 0500 09/01/20 0529 09/01/20 0859  BP: 125/76  129/82 137/83  Pulse: 78  76 75  Resp: 20  20   Temp: 97.8 F (36.6 C)  98 F (36.7 C)   TempSrc:      SpO2: 99%  100%   Weight:  (!) 143.5 kg    Height:       No data found.   Intake/Output Summary (Last 24 hours) at 09/01/2020 1227 Last data filed at 09/01/2020 0300 Gross per 24 hour  Intake 440 ml  Output 2800 ml  Net -2360 ml   Filed Weights   08/29/20 0434 08/31/20 0528 09/01/20 0500  Weight: (!) 144.8 kg (!) 143.4 kg (!) 143.5 kg    Exam:  GEN: NAD SKIN: Warm and dry EYES: No pallor or icterus ENT: MMM CV: RRR PULM: CTA B ABD: soft, distended, NT, +BS CNS: AAO x 3, non focal EXT: Bilateral lower extremity edema from thighs to feet (2+).  No erythema or tenderness. GU: Scrotal edema with mild tenderness        Data Reviewed:   I have personally reviewed following labs and imaging studies:  Labs: Labs show the following:   Basic Metabolic Panel: Recent Labs  Lab 08/28/20 0504 08/29/20 0405 08/30/20 0353 08/31/20 0434 09/01/20 0759  NA 137 135 135 135 136  K 3.4* 3.7 3.8 4.3 4.2  CL 105 104 104 104 104  CO2 26 24 25 26 25   GLUCOSE 116* 172* 155* 172* 160*  BUN 40* 43* 41* 40* 38*  CREATININE 1.97* 1.94* 1.96* 1.67* 1.69*  CALCIUM 8.3* 8.3* 8.3* 8.3* 8.6*  MG  --  1.6* 1.8 1.9 1.8  PHOS  --   --   --  3.9  --    GFR Estimated Creatinine Clearance: 65.5 mL/min (A) (by C-G formula based on SCr of 1.69 mg/dL (H)). Liver Function Tests: No results for input(s): AST, ALT, ALKPHOS, BILITOT, PROT, ALBUMIN in the last 168 hours. No results for input(s): LIPASE,  AMYLASE in the last 168 hours. No results for input(s): AMMONIA in the last 168 hours. Coagulation profile No results for input(s): INR, PROTIME in the last 168 hours.  CBC: Recent  Labs  Lab 08/30/20 0353 08/31/20 0434  WBC 3.4* 3.5*  NEUTROABS  --  1.4*  HGB 8.3* 8.7*  HCT 27.4* 28.2*  MCV 81.8 80.6  PLT 233 232   Cardiac Enzymes: No results for input(s): CKTOTAL, CKMB, CKMBINDEX, TROPONINI in the last 168 hours. BNP (last 3 results) No results for input(s): PROBNP in the last 8760 hours. CBG: Recent Labs  Lab 08/31/20 1208 08/31/20 1616 08/31/20 2121 09/01/20 0739 09/01/20 1213  GLUCAP 132* 147* 206* 153* 143*   D-Dimer: No results for input(s): DDIMER in the last 72 hours. Hgb A1c: No results for input(s): HGBA1C in the last 72 hours. Lipid Profile: No results for input(s): CHOL, HDL, LDLCALC, TRIG, CHOLHDL, LDLDIRECT in the last 72 hours. Thyroid function studies: No results for input(s): TSH, T4TOTAL, T3FREE, THYROIDAB in the last 72 hours.  Invalid input(s): FREET3 Anemia work up: No results for input(s): VITAMINB12, FOLATE, FERRITIN, TIBC, IRON, RETICCTPCT in the last 72 hours. Sepsis Labs: Recent Labs  Lab 08/30/20 0353 08/31/20 0434  WBC 3.4* 3.5*    Microbiology Recent Results (from the past 240 hour(s))  MRSA PCR Screening     Status: None   Collection Time: 08/22/20  5:39 PM  Result Value Ref Range Status   MRSA by PCR NEGATIVE NEGATIVE Final    Comment:        The GeneXpert MRSA Assay (FDA approved for NASAL specimens only), is one component of a comprehensive MRSA colonization surveillance program. It is not intended to diagnose MRSA infection nor to guide or monitor treatment for MRSA infections. Performed at [facility], [place] [address], [place], [place] [address]   C Difficile Quick Screen w PCR reflex     Status: None   Collection Time: 08/23/20  4:57 PM   Specimen: STOOL  Result Value Ref Range Status   C  Diff antigen NEGATIVE NEGATIVE Final   C Diff toxin NEGATIVE NEGATIVE Final   C Diff interpretation No C. difficile detected.  Final    Comment: Performed at [facility], [place] [address], [place], [place] [address]  Gastrointestinal Panel by PCR , Stool     Status: None   Collection Time: 08/23/20  4:58 PM   Specimen: STOOL  Result Value Ref Range Status   Campylobacter species NOT DETECTED NOT DETECTED Final   Plesimonas shigelloides NOT DETECTED NOT DETECTED Final   Salmonella species NOT DETECTED NOT DETECTED Final   Yersinia enterocolitica NOT DETECTED NOT DETECTED Final   Vibrio species NOT DETECTED NOT DETECTED Final   Vibrio cholerae NOT DETECTED NOT DETECTED Final   Enteroaggregative E coli (EAEC) NOT DETECTED NOT DETECTED Final   Enteropathogenic E coli (EPEC) NOT DETECTED NOT DETECTED Final   Enterotoxigenic E coli (ETEC) NOT DETECTED NOT DETECTED Final   Shiga like toxin producing E coli (STEC) NOT DETECTED NOT DETECTED Final   Shigella/Enteroinvasive E coli (EIEC) NOT DETECTED NOT DETECTED Final   Cryptosporidium NOT DETECTED NOT DETECTED Final   Cyclospora cayetanensis NOT DETECTED NOT DETECTED Final   Entamoeba histolytica NOT DETECTED NOT DETECTED Final   Giardia lamblia NOT DETECTED NOT DETECTED Final   Adenovirus F40/41 NOT DETECTED NOT DETECTED Final   Astrovirus NOT DETECTED NOT DETECTED Final   Norovirus GI/GII NOT DETECTED NOT DETECTED Final   Rotavirus A NOT DETECTED NOT DETECTED Final   Sapovirus (I, II, IV, and V) NOT DETECTED NOT DETECTED Final    Comment: Performed at [facility], [address] [place], [place] [address]    Procedures and diagnostic studies:  CT ABDOMEN PELVIS WO CONTRAST  Result Date: 08/30/2020 CLINICAL DATA:  Abdominal swelling. Query sigmoid volvulus. Follow-up scan from 06/24. EXAM: CT ABDOMEN AND PELVIS WITHOUT CONTRAST TECHNIQUE: Multidetector CT imaging of the abdomen and pelvis was performed  following the standard protocol without IV contrast. COMPARISON:  08/18/2020 FINDINGS: Lower chest: Minimal bilateral pleural effusions. Motion artifact limits evaluation of the lungs but there appears to be some airspace disease, likely edema. Cardiac enlargement with small pericardial effusion. Hepatobiliary: Cholelithiasis with large stone in the gallbladder neck, unchanged. No focal liver lesions. Pancreas: Unremarkable. No pancreatic ductal dilatation or surrounding inflammatory changes. Spleen: Normal in size without focal abnormality. Adrenals/Urinary Tract: Adrenal glands are unremarkable. Kidneys are normal, without renal calculi, focal lesion, or hydronephrosis. Bladder is unremarkable. Stomach/Bowel: Stomach, small bowel, and colon are not abnormally distended. There is gas and stool in the colon but no evidence of sigmoid volvulus currently. Appendix is not identified. Vascular/Lymphatic: Aortic atherosclerosis. No enlarged abdominal or pelvic lymph nodes. Reproductive: Prostate is unremarkable. Other: Small amount of free fluid in the pelvis, likely ascites. No free air. Extensive edema throughout the subcutaneous fat. Musculoskeletal: Degenerative changes in the spine. No destructive bone lesions. Examination is technically limited due to body habitus of the patient. IMPRESSION: 1. No evidence of bowel obstruction or sigmoid volvulus. 2. Cholelithiasis. 3. Small bilateral pleural effusions with probable edema in the lung bases. Cardiac enlargement with small pericardial effusion. 4. Diffuse soft tissue edema throughout the subcutaneous fat. 5. Aortic atherosclerosis. Electronically Signed   By: Lucienne Capers M.D.   On: 08/30/2020 19:36               LOS: 10 days   [place] [facility]   Pager on [email or web address]. If 7PM-7AM, please contact night-coverage at [email or web address]     09/01/2020, 12:27 PM

## 2020-09-02 DIAGNOSIS — I5033 Acute on chronic diastolic (congestive) heart failure: Secondary | ICD-10-CM | POA: Diagnosis not present

## 2020-09-02 LAB — BASIC METABOLIC PANEL
Anion gap: 6 (ref 5–15)
BUN: 40 mg/dL — ABNORMAL HIGH (ref 6–20)
CO2: 26 mmol/L (ref 22–32)
Calcium: 8.3 mg/dL — ABNORMAL LOW (ref 8.9–10.3)
Chloride: 105 mmol/L (ref 98–111)
Creatinine, Ser: 1.74 mg/dL — ABNORMAL HIGH (ref 0.61–1.24)
GFR, Estimated: 45 mL/min — ABNORMAL LOW (ref >60)
Glucose, Bld: 167 mg/dL — ABNORMAL HIGH (ref 70–99)
Potassium: 4.2 mmol/L (ref 3.5–5.1)
Sodium: 137 mmol/L (ref 135–145)

## 2020-09-02 LAB — GLUCOSE, CAPILLARY
Glucose-Capillary: 173 mg/dL — ABNORMAL HIGH (ref 70–99)
Glucose-Capillary: 168 mg/dL — ABNORMAL HIGH (ref 70–99)
Glucose-Capillary: 133 mg/dL — ABNORMAL HIGH (ref 70–99)
Glucose-Capillary: 152 mg/dL — ABNORMAL HIGH (ref 70–99)

## 2020-09-02 LAB — MAGNESIUM: Magnesium: 1.8 mg/dL (ref 1.7–2.4)

## 2020-09-02 NOTE — Progress Notes (Addendum)
PROGRESS NOTE  [name] ZOX:[id number] DOB: [birth year] DOA: 08/21/2020 PCP: [name], NP  HPI/Recap of past 24 hours: Patient seen and examined at bedside.  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  AKI on chronic kidney disease stage IIIa is improving creatinine is stable continue to monitor  3.  Acute on chronic diastolic congestive heart failure with anasarca We will continue IV Lasix we will monitor daily weight BMP and urine output His last 2D echo showed estimated EF of 60 to 70% with grade 2 diastolic dysfunction.  4.  History of stroke with right-sided weakness. Continue aspirin and Lipitor Physical therapy recommend SNF  5.  Anemia: Likely anemia of chronic kidney disease we will monitor     Code Status: Full  Severity of Illness: The appropriate patient status for this patient is INPATIENT. Inpatient status is judged to be reasonable and necessary in order to provide the required intensity of service to ensure the patient's safety. The patient's presenting symptoms, physical exam findings, and initial radiographic and laboratory data in the context of their chronic comorbidities is felt to place them at high risk for further clinical deterioration. Furthermore, it is not anticipated that the patient will be medically stable  for discharge from the hospital within 2 midnights of admission. The following factors support the patient status of inpatient.  \  * I certify that at the point of admission it is my clinical judgment that the patient will require inpatient hospital care spanning beyond 2 midnights from the point of admission due to high intensity of service, high risk for further deterioration and high frequency of surveillance required.*   Family Communication: Patient  Disposition Plan:   Dispo: The patient is from: Home              Anticipated d/c is to: SNF possibly by Monday              Patient currently is not medically stable to d/c.              Difficult to place patient No   Consultants: Cardiology General surgeon Gastroenterologist    Procedures: None   Antimicrobials: None  DVT prophylaxis: Lovenox   Objective: Vitals:   09/01/20 1351 09/01/20 1658 09/01/20 2053 09/02/20 0500  BP: 134/85 (!) 135/91 124/86   Pulse: 75 77 75   Resp: 18  18   Temp:   98 F (36.7 C)   TempSrc:      SpO2: 100%  100%   Weight:    (!) 142.2 kg  Height:        Intake/Output Summary (Last 24 hours) at 09/02/2020 0859 Last data filed at 09/02/2020 0525 Gross per 24 hour  Intake 1080 ml  Output 1150 ml  Net -70 ml   Filed Weights   08/31/20  0109 09/01/20 0500 09/02/20 0500  Weight: (!) 143.4 kg (!) 143.5 kg (!) 142.2 kg   Body mass index is 47.67 kg/m.  Exam:  General: 60 y.o. year-old male well developed well nourished in no acute distress.  Alert and oriented x3. Cardiovascular: Regular rate and rhythm with no rubs or gallops.  No thyromegaly or JVD noted.   Respiratory: Clear to auscultation with no wheezes or rales. Good inspiratory effort. Abdomen: Slightly firm slightly distended, nontender, bowel sounds x4 quadrants.  Slightly tinkling Musculoskeletal: No lower extremity edema. 2/4 pulses in all 4 extremities. Skin: No ulcerative lesions noted or rashes, Psychiatry: Mood is  appropriate for condition and setting    Data Reviewed: CBC: Recent Labs  Lab 08/30/20 0353 08/31/20 0434  WBC 3.4* 3.5*  NEUTROABS  --  1.4*  HGB 8.3* 8.7*  HCT 27.4* 28.2*  MCV 81.8 80.6  PLT 233 323   Basic Metabolic Panel: Recent Labs  Lab 08/29/20 0405 08/30/20 0353 08/31/20 0434 09/01/20 0759 09/02/20 0721  NA 135 135 135 136 137  K 3.7 3.8 4.3 4.2 4.2  CL 104 104 104 104 105  CO2 24 25 26 25 26   GLUCOSE 172* 155* 172* 160* 167*  BUN 43* 41* 40* 38* 40*  CREATININE 1.94* 1.96* 1.67* 1.69* 1.74*  CALCIUM 8.3* 8.3* 8.3* 8.6* 8.3*  MG 1.6* 1.8 1.9 1.8 1.8  PHOS  --   --  3.9  --   --    GFR: Estimated Creatinine Clearance: 63.3 mL/min (A) (by C-G formula based on SCr of 1.74 mg/dL (H)). Liver Function Tests: No results for input(s): AST, ALT, ALKPHOS, BILITOT, PROT, ALBUMIN in the last 168 hours. No results for input(s): LIPASE, AMYLASE in the last 168 hours. No results for input(s): AMMONIA in the last 168 hours. Coagulation Profile: No results for input(s): INR, PROTIME in the last 168 hours. Cardiac Enzymes: No results for input(s): CKTOTAL, CKMB, CKMBINDEX, TROPONINI in the last 168 hours. BNP (last 3 results) No results for input(s): PROBNP in the last 8760 hours. HbA1C: No results for input(s): HGBA1C in the last 72 hours. CBG: Recent Labs  Lab 09/01/20 0739 09/01/20 1213 09/01/20 1643 09/01/20 2050 09/02/20 0800  GLUCAP 153* 143* 163* 204* 173*   Lipid Profile: No results for input(s): CHOL, HDL, LDLCALC, TRIG, CHOLHDL, LDLDIRECT in the last 72 hours. Thyroid Function Tests: No results for input(s): TSH, T4TOTAL, FREET4, T3FREE, THYROIDAB in the last 72 hours. Anemia Panel: No results for input(s): VITAMINB12, FOLATE, FERRITIN, TIBC, IRON, RETICCTPCT in the last 72 hours. Urine analysis:    Component Value Date/Time   COLORURINE STRAW (A) 08/16/2020 1453   APPEARANCEUR CLEAR 08/16/2020 1453   APPEARANCEUR Clear 12/31/2013 2116    LABSPEC 1.008 08/16/2020 1453   LABSPEC 1.032 12/31/2013 2116   PHURINE 6.0 08/16/2020 1453   GLUCOSEU 150 (A) 08/16/2020 1453   GLUCOSEU >=500 12/31/2013 2116   HGBUR SMALL (A) 08/16/2020 1453   BILIRUBINUR NEGATIVE 08/16/2020 1453   BILIRUBINUR Negative 12/31/2013 2116   KETONESUR NEGATIVE 08/16/2020 1453   PROTEINUR 100 (A) 08/16/2020 1453   UROBILINOGEN 1.0 08/28/2013 1709   NITRITE NEGATIVE 08/16/2020 1453   LEUKOCYTESUR NEGATIVE 08/16/2020 1453   LEUKOCYTESUR Negative 12/31/2013 2116   Sepsis Labs: @LABRCNTIP (procalcitonin:4,lacticidven:4)  ) Recent Results (from the past 240 hour(s))  C Difficile Quick Screen w PCR reflex     Status: None   Collection Time: 08/23/20  4:57 PM   Specimen: STOOL  Result Value Ref Range Status  C Diff antigen NEGATIVE NEGATIVE Final   C Diff toxin NEGATIVE NEGATIVE Final   C Diff interpretation No C. difficile detected.  Final    Comment: Performed at [facility], [place] [address], [place], [place] [address]  Gastrointestinal Panel by PCR , Stool     Status: None   Collection Time: 08/23/20  4:58 PM   Specimen: STOOL  Result Value Ref Range Status   Campylobacter species NOT DETECTED NOT DETECTED Final   Plesimonas shigelloides NOT DETECTED NOT DETECTED Final   Salmonella species NOT DETECTED NOT DETECTED Final   Yersinia enterocolitica NOT DETECTED NOT DETECTED Final   Vibrio species NOT DETECTED NOT DETECTED Final   Vibrio cholerae NOT DETECTED NOT DETECTED Final   Enteroaggregative E coli (EAEC) NOT DETECTED NOT DETECTED Final   Enteropathogenic E coli (EPEC) NOT DETECTED NOT DETECTED Final   Enterotoxigenic E coli (ETEC) NOT DETECTED NOT DETECTED Final   Shiga like toxin producing E coli (STEC) NOT DETECTED NOT DETECTED Final   Shigella/Enteroinvasive E coli (EIEC) NOT DETECTED NOT DETECTED Final   Cryptosporidium NOT DETECTED NOT DETECTED Final   Cyclospora cayetanensis NOT DETECTED NOT DETECTED Final    Entamoeba histolytica NOT DETECTED NOT DETECTED Final   Giardia lamblia NOT DETECTED NOT DETECTED Final   Adenovirus F40/41 NOT DETECTED NOT DETECTED Final   Astrovirus NOT DETECTED NOT DETECTED Final   Norovirus GI/GII NOT DETECTED NOT DETECTED Final   Rotavirus A NOT DETECTED NOT DETECTED Final   Sapovirus (I, II, IV, and V) NOT DETECTED NOT DETECTED Final    Comment: Performed at [facility], [address], [place], [place] [address]      Studies: No results found.  Scheduled Meds:  amLODipine  10 mg Oral Daily   aspirin  81 mg Oral Daily   atorvastatin  80 mg Oral QHS   carvedilol  12.5 mg Oral BID WC   enoxaparin (LOVENOX) injection  40 mg Subcutaneous Q24H   furosemide  80 mg Intravenous BID   insulin aspart  0-9 Units Subcutaneous TID WC   insulin aspart  3 Units Subcutaneous TID WC   insulin glargine  20 Units Subcutaneous QHS   isosorbide-hydrALAZINE  1 tablet Oral TID   pantoprazole  40 mg Oral Daily   polyethylene glycol  17 g Oral Q8H   potassium chloride  20 mEq Oral BID   pregabalin  75 mg Oral BID   saccharomyces boulardii  250 mg Oral BID   sodium chloride flush  3 mL Intravenous Q12H   tamsulosin  0.8 mg Oral Daily    Continuous Infusions:  sodium chloride 250 mL (08/30/20 1403)     LOS: 11 days     Cristal Deer, MD [facility]  To reach me or the doctor on call, go to: [email or web address] Password [email or web address]  09/02/2020, 8:59 AM

## 2020-09-03 DIAGNOSIS — I5033 Acute on chronic diastolic (congestive) heart failure: Secondary | ICD-10-CM | POA: Diagnosis not present

## 2020-09-03 LAB — GLUCOSE, CAPILLARY
Glucose-Capillary: 155 mg/dL — ABNORMAL HIGH (ref 70–99)
Glucose-Capillary: 189 mg/dL — ABNORMAL HIGH (ref 70–99)
Glucose-Capillary: 187 mg/dL — ABNORMAL HIGH (ref 70–99)
Glucose-Capillary: 168 mg/dL — ABNORMAL HIGH (ref 70–99)

## 2020-09-03 MED ORDER — POLYETHYLENE GLYCOL 3350 17 G PO PACK
17.0000 g | PACK | Freq: Two times a day (BID) | ORAL | Status: DC
  Filled 2020-09-03 (×9): qty 1

## 2020-09-03 NOTE — Progress Notes (Signed)
PROGRESS NOTE  [name] OHY:[id number] DOB: [birth year] DOA: 08/21/2020 PCP: [name], NP  HPI/Recap of past 24 hours: Patient seen and examined at bedside.  He stated he is passing gas and he has made bowel movement he denies any abdominal pain  September 03, 2020: Patient seen and examined at bedside patient is complaining of having liquid stool and has been refusing his MiraLAX. 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  AKI on chronic kidney disease stage IIIa is improving creatinine is stable continue to monitor Patient is on fluid restriction due to CHF with pulmonary edema and anasarca  3.  Acute on chronic diastolic congestive heart failure with anasarca We will continue IV Lasix we will monitor daily weight BMP and urine output His last 2D echo showed estimated EF of 60 to 70% with grade 2 diastolic dysfunction.  4.  History of stroke with right-sided weakness. Continue aspirin and Lipitor Physical therapy recommend SNF  5.  Anemia: Likely anemia of chronic kidney disease we will monitor     Code Status: Full  Severity of Illness: The appropriate patient status for this patient is INPATIENT. Inpatient status is judged to be reasonable and necessary in order to provide the required intensity of service to ensure the patient's safety.  The patient's presenting symptoms, physical exam findings, and initial radiographic and laboratory data in the context of their chronic comorbidities is felt to place them at high risk for further clinical deterioration. Furthermore, it is not anticipated that the patient will be medically stable for discharge from the hospital within 2 midnights of admission. The following factors support the patient status of inpatient.  \  * I certify that at the point of admission it is my clinical judgment that the patient will require inpatient hospital care spanning beyond 2 midnights from the point of admission due to high intensity of service, high risk for further deterioration and high frequency of surveillance required.*   Family Communication: Patient  Disposition Plan:   Dispo: The patient is from: Home              Anticipated d/c is to: SNF possibly by Monday              Patient currently is not medically stable to d/c.              Difficult to place patient No   Consultants: Cardiology General surgeon Gastroenterologist    Procedures: None   Antimicrobials: None  DVT prophylaxis: Lovenox   Objective: Vitals:   09/02/20 0500 09/02/20 1232 09/02/20 2122 09/03/20 0500  BP:  123/76 124/70   Pulse:  77 72   Resp:  16 18   Temp:  98.1 F (36.7 C) 97.9 F (36.6 C)   TempSrc:  Oral Oral   SpO2:  98% 98%   Weight: (!) 142.2  kg   (!) 142.9 kg  Height:        Intake/Output Summary (Last 24 hours) at 09/03/2020 1441 Last data filed at 09/03/2020 0249 Gross per 24 hour  Intake 600 ml  Output 1500 ml  Net -900 ml    Filed Weights   09/01/20 0500 09/02/20 0500 09/03/20 0500  Weight: (!) 143.5 kg (!) 142.2 kg (!) 142.9 kg   Body mass index is 47.91 kg/m.  Exam:  General: 60 y.o. year-old male well developed well nourished in no acute distress.  Alert and oriented x3. Cardiovascular: Regular rate and rhythm with no rubs or gallops.  No thyromegaly or JVD noted.    Respiratory: Clear to auscultation with no wheezes or rales. Good inspiratory effort. Abdomen: Slightly firm slightly distended, nontender, bowel sounds x4 quadrants.  Slightly reduced bowel sounds Musculoskeletal: No lower extremity edema. 2/4 pulses in all 4 extremities. Skin: No ulcerative lesions noted or rashes, Psychiatry: Mood is appropriate for condition and setting    Data Reviewed: CBC: Recent Labs  Lab 08/30/20 0353 08/31/20 0434  WBC 3.4* 3.5*  NEUTROABS  --  1.4*  HGB 8.3* 8.7*  HCT 27.4* 28.2*  MCV 81.8 80.6  PLT 233 485    Basic Metabolic Panel: Recent Labs  Lab 08/29/20 0405 08/30/20 0353 08/31/20 0434 09/01/20 0759 09/02/20 0721  NA 135 135 135 136 137  K 3.7 3.8 4.3 4.2 4.2  CL 104 104 104 104 105  CO2 24 25 26 25 26   GLUCOSE 172* 155* 172* 160* 167*  BUN 43* 41* 40* 38* 40*  CREATININE 1.94* 1.96* 1.67* 1.69* 1.74*  CALCIUM 8.3* 8.3* 8.3* 8.6* 8.3*  MG 1.6* 1.8 1.9 1.8 1.8  PHOS  --   --  3.9  --   --     GFR: Estimated Creatinine Clearance: 63.5 mL/min (A) (by C-G formula based on SCr of 1.74 mg/dL (H)). Liver Function Tests: No results for input(s): AST, ALT, ALKPHOS, BILITOT, PROT, ALBUMIN in the last 168 hours. No results for input(s): LIPASE, AMYLASE in the last 168 hours. No results for input(s): AMMONIA in the last 168 hours. Coagulation Profile: No results for input(s): INR, PROTIME in the last 168 hours. Cardiac Enzymes: No results for input(s): CKTOTAL, CKMB, CKMBINDEX, TROPONINI in the last 168 hours. BNP (last 3 results) No results for input(s): PROBNP in the last 8760 hours. HbA1C: No results for input(s): HGBA1C in the last 72 hours. CBG: Recent Labs  Lab 09/02/20 1135 09/02/20 1630 09/02/20 2119 09/03/20 0730 09/03/20 1146  GLUCAP 168* 133* 152* 155* 189*    Lipid Profile: No results for input(s): CHOL, HDL, LDLCALC, TRIG, CHOLHDL, LDLDIRECT in the last 72 hours. Thyroid Function Tests: No results for input(s):  TSH, T4TOTAL, FREET4, T3FREE, THYROIDAB in the last 72 hours. Anemia Panel: No results for input(s): VITAMINB12, FOLATE, FERRITIN, TIBC, IRON, RETICCTPCT in the last 72 hours. Urine analysis:    Component Value Date/Time   COLORURINE STRAW (A) 08/16/2020 1453   APPEARANCEUR CLEAR 08/16/2020 1453   APPEARANCEUR Clear 12/31/2013 2116   LABSPEC 1.008 08/16/2020 1453   LABSPEC 1.032 12/31/2013 2116   PHURINE 6.0 08/16/2020 1453   GLUCOSEU 150 (A) 08/16/2020 1453   GLUCOSEU >=500 12/31/2013 2116   HGBUR SMALL (A) 08/16/2020 1453   BILIRUBINUR NEGATIVE 08/16/2020 1453   BILIRUBINUR Negative 12/31/2013 2116   KETONESUR NEGATIVE 08/16/2020 1453   PROTEINUR 100 (A) 08/16/2020 1453   UROBILINOGEN 1.0 08/28/2013 1709   NITRITE NEGATIVE 08/16/2020 1453  LEUKOCYTESUR NEGATIVE 08/16/2020 1453   LEUKOCYTESUR Negative 12/31/2013 2116   Sepsis Labs: @LABRCNTIP (procalcitonin:4,lacticidven:4)  ) No results found for this or any previous visit (from the past 240 hour(s)).     Studies: No results found.  Scheduled Meds:  amLODipine  10 mg Oral Daily   aspirin  81 mg Oral Daily   atorvastatin  80 mg Oral QHS   carvedilol  12.5 mg Oral BID WC   enoxaparin (LOVENOX) injection  40 mg Subcutaneous Q24H   furosemide  80 mg Intravenous BID   insulin aspart  0-9 Units Subcutaneous TID WC   insulin aspart  3 Units Subcutaneous TID WC   insulin glargine  20 Units Subcutaneous QHS   isosorbide-hydrALAZINE  1 tablet Oral TID   pantoprazole  40 mg Oral Daily   polyethylene glycol  17 g Oral Q8H   potassium chloride  20 mEq Oral BID   pregabalin  75 mg Oral BID   saccharomyces boulardii  250 mg Oral BID   sodium chloride flush  3 mL Intravenous Q12H   tamsulosin  0.8 mg Oral Daily    Continuous Infusions:  sodium chloride 250 mL (08/30/20 1403)     LOS: 12 days     Cristal Deer, MD [facility]  To reach me or the doctor on call, go to: [email or web address] Password  [facility]  09/03/2020, 2:41 PM

## 2020-09-04 ENCOUNTER — Ambulatory Visit: Payer: Medicare HMO | Admitting: Student

## 2020-09-04 DIAGNOSIS — I5033 Acute on chronic diastolic (congestive) heart failure: Secondary | ICD-10-CM | POA: Diagnosis not present

## 2020-09-04 DIAGNOSIS — N179 Acute kidney failure, unspecified: Secondary | ICD-10-CM | POA: Diagnosis not present

## 2020-09-04 LAB — BASIC METABOLIC PANEL
Anion gap: 5 (ref 5–15)
BUN: 43 mg/dL — ABNORMAL HIGH (ref 6–20)
CO2: 27 mmol/L (ref 22–32)
Calcium: 8.4 mg/dL — ABNORMAL LOW (ref 8.9–10.3)
Chloride: 104 mmol/L (ref 98–111)
Creatinine, Ser: 1.73 mg/dL — ABNORMAL HIGH (ref 0.61–1.24)
GFR, Estimated: 45 mL/min — ABNORMAL LOW (ref >60)
Glucose, Bld: 155 mg/dL — ABNORMAL HIGH (ref 70–99)
Potassium: 3.8 mmol/L (ref 3.5–5.1)
Sodium: 136 mmol/L (ref 135–145)

## 2020-09-04 LAB — GLUCOSE, CAPILLARY
Glucose-Capillary: 131 mg/dL — ABNORMAL HIGH (ref 70–99)
Glucose-Capillary: 143 mg/dL — ABNORMAL HIGH (ref 70–99)
Glucose-Capillary: 190 mg/dL — ABNORMAL HIGH (ref 70–99)
Glucose-Capillary: 124 mg/dL — ABNORMAL HIGH (ref 70–99)
Glucose-Capillary: 146 mg/dL — ABNORMAL HIGH (ref 70–99)

## 2020-09-04 NOTE — Progress Notes (Signed)
Progress Note    [name]  ATF:[id number] DOB: [birth year]  DOA: 08/21/2020 PCP: [name], NP      Brief Narrative:    Medical records reviewed and are as summarized below:  APOSTOLOS BLAGG is a 60 y.o. male PMH including chronic diastolic CHF, diabetes, morbid obesity, stroke presented with swelling, chest pain or shortness of breath.  He was admitted for acute on chronic diastolic CHF with anasarca.    He was treated with IV Lasix.  He also had AKI and creatinine improved with IV diuresis.  He developed colonic ileus that improved with conservative management.  PT and OT recommend discharge to SNF.     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  (Morbid obesity)  Acute on chronic diastolic CHF, anasarca (as evidenced by lower extremity edema, abdominal wall edema, scrotal edema, abdominal distention): 2D echo showed EF estimated at 65 to 27%, grade 2 diastolic dysfunction.  Continue IV Lasix.  Monitor BMP, urine output and daily weight.  AKI on CKD stage IIIa: Improved.  Creatinine stable.  Ileus: Improved.  CT abdomen and pelvis on 08/31/2020 did not show any evidence of bowel obstruction or sigmoid volvulus.  Advance diet from soft diet to regular diet.  GI has signed off.  Hypokalemia: Improved.  Continue repletion while on IV Lasix.  Right upper extremity swelling: Venous duplex of the upper extremity did not show any evidence of DVT.  Recent diarrhea: Resolved.   Stool for C. difficile and GI panel were negative.  History of stroke with right-sided weakness: Continue aspirin and Lipitor.  PT recommends discharge to SNF.  Pericardial effusion: Stable with no signs of cardiac tamponade.  Other comorbidities include depression, BPH, type II DM, hypoalbuminemia, anemia of chronic disease  Debility: Continue PT and OT.  Awaiting placement.  Follow-up with social worker to assist with disposition to SNF.  Diet Order             DIET SOFT Room service appropriate? Yes; Fluid consistency: Thin  Diet effective now                      Consultants: Youth worker Cardiologist  Procedures: None    Medications:    amLODipine  10 mg Oral Daily   aspirin  81 mg Oral Daily   atorvastatin  80 mg Oral QHS   carvedilol  12.5 mg Oral BID WC   enoxaparin (LOVENOX) injection  40 mg Subcutaneous Q24H   furosemide  80 mg Intravenous BID   insulin aspart  0-9 Units Subcutaneous TID WC   insulin aspart  3 Units Subcutaneous TID WC   insulin glargine  20 Units Subcutaneous QHS   isosorbide-hydrALAZINE  1 tablet Oral TID   pantoprazole  40 mg Oral Daily   polyethylene glycol  17 g Oral BID   potassium chloride  20 mEq Oral BID   pregabalin  75 mg Oral BID  saccharomyces boulardii  250 mg Oral BID   sodium chloride flush  3 mL Intravenous Q12H   tamsulosin  0.8 mg Oral Daily   Continuous Infusions:  sodium chloride 250 mL (08/30/20 1403)     Anti-infectives (From admission, onward)    None              Family Communication/Anticipated D/C date and plan/Code Status   DVT prophylaxis: enoxaparin (LOVENOX) injection 40 mg Start: 08/22/20 1000     Code Status: Full Code  Family Communication: None Disposition Plan:    Status is: Inpatient  Remains inpatient appropriate because:IV treatments appropriate due to intensity of illness or inability to take PO and Inpatient level of care appropriate due to  severity of illness  Dispo: The patient is from: Home              Anticipated d/c is to: SNF              Patient currently is not medically stable to d/c.   Difficult to place patient No           Subjective:   Interval events noted.  He still complains of swelling in his thighs and legs.  No shortness of breath or chest pain.  He has moved his bowels.  Objective:    Vitals:   09/03/20 2141 09/04/20 0453 09/04/20 0500 09/04/20 1349  BP: (!) 152/82 133/72  (!) 167/77  Pulse: 74 74  75  Resp:  18  20  Temp: 97.6 F (36.4 C) 98.1 F (36.7 C)  97.8 F (36.6 C)  TempSrc: Oral   Oral  SpO2: 100% 97%  96%  Weight:   (!) 144.6 kg   Height:       No data found.   Intake/Output Summary (Last 24 hours) at 09/04/2020 1359 Last data filed at 09/04/2020 1352 Gross per 24 hour  Intake 960 ml  Output 1800 ml  Net -840 ml   Filed Weights   09/02/20 0500 09/03/20 0500 09/04/20 0500  Weight: (!) 142.2 kg (!) 142.9 kg (!) 144.6 kg    Exam:  GEN: NAD SKIN: Warm and dry EYES: No pallor or icterus ENT: MMM CV: RRR PULM: CTA B ABD: soft, distended, NT, +BS CNS: AAO x 3, non focal EXT: Bilateral lower extremity edema from thighs to the feet (slowly improving), no tenderness GU: Scrotal edema with mild tenderness         Data Reviewed:   I have personally reviewed following labs and imaging studies:  Labs: Labs show the following:   Basic Metabolic Panel: Recent Labs  Lab 08/29/20 0405 08/30/20 0353 08/31/20 0434 09/01/20 0759 09/02/20 0721 09/04/20 0411  NA 135 135 135 136 137 136  K 3.7 3.8 4.3 4.2 4.2 3.8  CL 104 104 104 104 105 104  CO2 24 25 26 25 26 27   GLUCOSE 172* 155* 172* 160* 167* 155*  BUN 43* 41* 40* 38* 40* 43*  CREATININE 1.94* 1.96* 1.67* 1.69* 1.74* 1.73*  CALCIUM 8.3* 8.3* 8.3* 8.6* 8.3* 8.4*  MG 1.6* 1.8 1.9 1.8 1.8  --   PHOS  --   --  3.9  --   --   --    GFR Estimated Creatinine Clearance: 64.3 mL/min (A) (by C-G formula  based on SCr of 1.73 mg/dL (H)). Liver Function Tests: No results for input(s): AST, ALT, ALKPHOS, BILITOT, PROT, ALBUMIN in the last 168 hours. No results for input(s): LIPASE, AMYLASE in the  last 168 hours. No results for input(s): AMMONIA in the last 168 hours. Coagulation profile No results for input(s): INR, PROTIME in the last 168 hours.  CBC: Recent Labs  Lab 08/30/20 0353 08/31/20 0434  WBC 3.4* 3.5*  NEUTROABS  --  1.4*  HGB 8.3* 8.7*  HCT 27.4* 28.2*  MCV 81.8 80.6  PLT 233 232   Cardiac Enzymes: No results for input(s): CKTOTAL, CKMB, CKMBINDEX, TROPONINI in the last 168 hours. BNP (last 3 results) No results for input(s): PROBNP in the last 8760 hours. CBG: Recent Labs  Lab 09/03/20 1647 09/03/20 2137 09/04/20 0747 09/04/20 0957 09/04/20 1212  GLUCAP 187* 168* 131* 143* 190*   D-Dimer: No results for input(s): DDIMER in the last 72 hours. Hgb A1c: No results for input(s): HGBA1C in the last 72 hours. Lipid Profile: No results for input(s): CHOL, HDL, LDLCALC, TRIG, CHOLHDL, LDLDIRECT in the last 72 hours. Thyroid function studies: No results for input(s): TSH, T4TOTAL, T3FREE, THYROIDAB in the last 72 hours.  Invalid input(s): FREET3 Anemia work up: No results for input(s): VITAMINB12, FOLATE, FERRITIN, TIBC, IRON, RETICCTPCT in the last 72 hours. Sepsis Labs: Recent Labs  Lab 08/30/20 0353 08/31/20 0434  WBC 3.4* 3.5*    Microbiology No results found for this or any previous visit (from the past 240 hour(s)).   Procedures and diagnostic studies:  No results found.             LOS: 13 days   [place] Copywriter, advertising on [email or web address]. If 7PM-7AM, please contact night-coverage at [email or web address]     09/04/2020, 1:59 PM

## 2020-09-04 NOTE — Plan of Care (Signed)
  Problem: Education: Goal: Knowledge of General Education information will improve Description: Including pain rating scale, medication(s)/side effects and non-pharmacologic comfort measures Outcome: Progressing   Problem: Health Behavior/Discharge Planning: Goal: Ability to manage health-related needs will improve Outcome: Progressing   Problem: Clinical Measurements: Goal: Ability to maintain clinical measurements within normal limits will improve Outcome: Progressing Goal: Will remain free from infection Outcome: Progressing Goal: Diagnostic test results will improve Outcome: Progressing Goal: Respiratory complications will improve Outcome: Progressing Goal: Cardiovascular complication will be avoided Outcome: Progressing   Problem: Activity: Goal: Risk for activity intolerance will decrease Outcome: Progressing   Problem: Nutrition: Goal: Adequate nutrition will be maintained Outcome: Progressing   Problem: Coping: Goal: Level of anxiety will decrease Outcome: Progressing   Problem: Elimination: Goal: Will not experience complications related to bowel motility Outcome: Progressing Goal: Will not experience complications related to urinary retention Outcome: Progressing   Problem: Pain Managment: Goal: General experience of comfort will improve Outcome: Progressing   Problem: Safety: Goal: Ability to remain free from injury will improve Outcome: Progressing   Problem: Skin Integrity: Goal: Risk for impaired skin integrity will decrease Outcome: Progressing   Problem: Education: Goal: Ability to demonstrate management of disease process will improve Outcome: Progressing Goal: Ability to verbalize understanding of medication therapies will improve Outcome: Progressing Goal: Individualized Educational Video(s) Outcome: Progressing   Problem: Cardiac: Goal: Ability to achieve and maintain adequate cardiopulmonary perfusion will improve Outcome:  Progressing

## 2020-09-04 NOTE — Progress Notes (Signed)
PT Cancellation Note  Patient Details Name: IZZAC ROCKETT MRN: [id number] DOB: 10-09-1960   Cancelled Treatment:     Pt not feeling well today with max c/o of nausea in am.  Returned later that day, still feeli8ng "really bad" and requested we "come back another day".    Rica Koyanagi  PTA Acute  Rehabilitation Services Pager      [phone] Office      [phone]

## 2020-09-05 ENCOUNTER — Ambulatory Visit: Payer: Medicare HMO | Admitting: Student

## 2020-09-05 DIAGNOSIS — I5033 Acute on chronic diastolic (congestive) heart failure: Secondary | ICD-10-CM | POA: Diagnosis not present

## 2020-09-05 DIAGNOSIS — N179 Acute kidney failure, unspecified: Secondary | ICD-10-CM | POA: Diagnosis not present

## 2020-09-05 LAB — GLUCOSE, CAPILLARY
Glucose-Capillary: 155 mg/dL — ABNORMAL HIGH (ref 70–99)
Glucose-Capillary: 190 mg/dL — ABNORMAL HIGH (ref 70–99)
Glucose-Capillary: 174 mg/dL — ABNORMAL HIGH (ref 70–99)
Glucose-Capillary: 187 mg/dL — ABNORMAL HIGH (ref 70–99)

## 2020-09-05 LAB — BASIC METABOLIC PANEL
Anion gap: 7 (ref 5–15)
BUN: 43 mg/dL — ABNORMAL HIGH (ref 6–20)
CO2: 26 mmol/L (ref 22–32)
Calcium: 8.7 mg/dL — ABNORMAL LOW (ref 8.9–10.3)
Chloride: 106 mmol/L (ref 98–111)
Creatinine, Ser: 1.61 mg/dL — ABNORMAL HIGH (ref 0.61–1.24)
GFR, Estimated: 49 mL/min — ABNORMAL LOW (ref >60)
Glucose, Bld: 106 mg/dL — ABNORMAL HIGH (ref 70–99)
Potassium: 3.8 mmol/L (ref 3.5–5.1)
Sodium: 139 mmol/L (ref 135–145)

## 2020-09-05 LAB — MAGNESIUM: Magnesium: 1.9 mg/dL (ref 1.7–2.4)

## 2020-09-05 MED ORDER — LOPERAMIDE HCL 2 MG PO CAPS
4.0000 mg | ORAL_CAPSULE | Freq: Once | ORAL | Status: AC
  Administered 2020-09-05: 4 mg via ORAL
  Filled 2020-09-05: qty 2

## 2020-09-05 MED ORDER — FUROSEMIDE 10 MG/ML IJ SOLN
40.0000 mg | Freq: Two times a day (BID) | INTRAMUSCULAR | Status: DC
  Administered 2020-09-05 – 2020-09-08 (×7): 40 mg via INTRAVENOUS
  Filled 2020-09-05 (×7): qty 4

## 2020-09-05 MED ORDER — LOPERAMIDE HCL 2 MG PO CAPS
2.0000 mg | ORAL_CAPSULE | Freq: Three times a day (TID) | ORAL | Status: AC | PRN
  Administered 2020-09-06: 2 mg via ORAL
  Filled 2020-09-05 (×2): qty 1

## 2020-09-05 NOTE — Progress Notes (Addendum)
Progress Note    [name]  JKD:[id number] DOB: 1960/06/21  DOA: 08/21/2020 PCP: Roy Perna, NP      Brief Narrative:    Medical records reviewed and are as summarized below:  [name] is a 60 y.o. male PMH including chronic diastolic CHF, diabetes, morbid obesity, stroke presented with swelling, chest pain or shortness of breath.  He was admitted for acute on chronic diastolic CHF with anasarca.    He was treated with IV Lasix.  He also had AKI and creatinine improved with IV diuresis.  He developed colonic ileus that improved with conservative management.  PT and OT recommend discharge to SNF.     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  (Morbid obesity)  Acute on chronic diastolic CHF, anasarca (as evidenced by lower extremity edema, abdominal wall edema, scrotal edema, abdominal distention): Continue IV Lasix because kidney function appears to be improving with diuresis.  Decrease IV Lasix from 80 mg to 40 mg twice daily.  Monitor BMP, urine output and daily weight. 2D echo showed EF estimated at 65 to 09%, grade 2 diastolic dysfunction.    AKI on CKD stage IIIa: Creatinine continues to improve.  Monitor closely.  Ileus: Improved.  CT abdomen and pelvis on 08/31/2020 did not show any evidence of bowel obstruction or sigmoid volvulus.  Advance diet from soft diet to regular diet.  GI has signed off.  Hypokalemia: Improved.  Continue repletion while on IV  Lasix.  Right upper extremity swelling: Venous duplex of the upper extremity did not show any evidence of DVT.  Recent diarrhea: Resolved.  Stool for C. difficile and GI panel were negative.  History of stroke with right-sided weakness: Continue aspirin and Lipitor.  PT recommends discharge to SNF.  Pericardial effusion: Stable with no signs of cardiac tamponade.  Other comorbidities include depression, BPH, type II DM, hypoalbuminemia, anemia of chronic disease  Debility: Continue PT and OT.  Awaiting placement.  Follow-up with social worker to assist with disposition to SNF.  COVID test has been ordered in preparation for discharge.  Diet Order             DIET SOFT Room service appropriate? Yes; Fluid consistency: Thin  Diet effective now                      Consultants: Youth worker Cardiologist  Procedures: None    Medications:    amLODipine  10 mg Oral Daily   aspirin  81 mg Oral Daily   atorvastatin  80 mg Oral QHS   carvedilol  12.5 mg Oral BID WC   enoxaparin (LOVENOX) injection  40 mg Subcutaneous Q24H   furosemide  80 mg Intravenous BID   insulin aspart  0-9 Units Subcutaneous TID WC   insulin aspart  3 Units Subcutaneous TID WC   insulin glargine  20 Units Subcutaneous QHS   isosorbide-hydrALAZINE  1 tablet Oral TID  pantoprazole  40 mg Oral Daily   polyethylene glycol  17 g Oral BID   potassium chloride  20 mEq Oral BID   pregabalin  75 mg Oral BID   saccharomyces boulardii  250 mg Oral BID   sodium chloride flush  3 mL Intravenous Q12H   tamsulosin  0.8 mg Oral Daily   Continuous Infusions:  sodium chloride 250 mL (08/30/20 1403)     Anti-infectives (From admission, onward)    None              Family Communication/Anticipated D/C date and plan/Code Status   DVT prophylaxis: enoxaparin (LOVENOX) injection 40 mg Start: 08/22/20 1000     Code Status: Full Code  Family Communication:  None Disposition Plan:    Status is: Inpatient  Remains inpatient appropriate because:IV treatments appropriate due to intensity of illness or inability to take PO and Inpatient level of care appropriate due to severity of illness  Dispo: The patient is from: Home              Anticipated d/c is to: SNF              Patient currently is medically stable to d/c.   Difficult to place patient Yes           Subjective:   Interval events noted.  He complains of swelling in the lower extremities.  Objective:    Vitals:   09/04/20 0500 09/04/20 1349 09/04/20 2045 09/05/20 0500  BP:  (!) 167/77 116/63 135/80  Pulse:  75 73 72  Resp:  20 20 20   Temp:  97.8 F (36.6 C) (!) 97.5 F (36.4 C) (!) 97.5 F (36.4 C)  TempSrc:  Oral Axillary Oral  SpO2:  96% 100% 100%  Weight: (!) 144.6 kg   (!) 141.7 kg  Height:       No data found.   Intake/Output Summary (Last 24 hours) at 09/05/2020 1153 Last data filed at 09/05/2020 0940 Gross per 24 hour  Intake 1080 ml  Output 2250 ml  Net -1170 ml   Filed Weights   09/03/20 0500 09/04/20 0500 09/05/20 0500  Weight: (!) 142.9 kg (!) 144.6 kg (!) 141.7 kg    Exam:  GEN: NAD SKIN: Warm and dry EYES: No pallor or icterus ENT: MMM CV: RRR PULM: CTA B ABD: soft, obese, NT, +BS CNS: AAO x 3, non focal EXT: Bilateral lower extremity edema slowly improving, no tenderness GU: Scrotal edema with mild tenderness     Data Reviewed:   I have personally reviewed following labs and imaging studies:  Labs: Labs show the following:   Basic Metabolic Panel: Recent Labs  Lab 08/30/20 0353 08/31/20 0434 09/01/20 0759 09/02/20 0721 09/04/20 0411 09/05/20 0917  NA 135 135 136 137 136 139  K 3.8 4.3 4.2 4.2 3.8 3.8  CL 104 104 104 105 104 106  CO2 25 26 25 26 27 26   GLUCOSE 155* 172* 160* 167* 155* 106*  BUN 41* 40* 38* 40* 43* 43*  CREATININE 1.96* 1.67* 1.69* 1.74* 1.73* 1.61*  CALCIUM 8.3* 8.3* 8.6* 8.3* 8.4* 8.7*  MG  1.8 1.9 1.8 1.8  --  1.9  PHOS  --  3.9  --   --   --   --    GFR Estimated Creatinine Clearance: 68.3 mL/min (A) (by C-G formula based on SCr of 1.61 mg/dL (H)). Liver Function Tests: No results for input(s): AST, ALT, ALKPHOS, BILITOT, PROT, ALBUMIN in the  last 168 hours. No results for input(s): LIPASE, AMYLASE in the last 168 hours. No results for input(s): AMMONIA in the last 168 hours. Coagulation profile No results for input(s): INR, PROTIME in the last 168 hours.  CBC: Recent Labs  Lab 08/30/20 0353 08/31/20 0434  WBC 3.4* 3.5*  NEUTROABS  --  1.4*  HGB 8.3* 8.7*  HCT 27.4* 28.2*  MCV 81.8 80.6  PLT 233 232   Cardiac Enzymes: No results for input(s): CKTOTAL, CKMB, CKMBINDEX, TROPONINI in the last 168 hours. BNP (last 3 results) No results for input(s): PROBNP in the last 8760 hours. CBG: Recent Labs  Lab 09/04/20 1212 09/04/20 1734 09/04/20 2044 09/05/20 0746 09/05/20 1127  GLUCAP 190* 124* 146* 155* 190*   D-Dimer: No results for input(s): DDIMER in the last 72 hours. Hgb A1c: No results for input(s): HGBA1C in the last 72 hours. Lipid Profile: No results for input(s): CHOL, HDL, LDLCALC, TRIG, CHOLHDL, LDLDIRECT in the last 72 hours. Thyroid function studies: No results for input(s): TSH, T4TOTAL, T3FREE, THYROIDAB in the last 72 hours.  Invalid input(s): FREET3 Anemia work up: No results for input(s): VITAMINB12, FOLATE, FERRITIN, TIBC, IRON, RETICCTPCT in the last 72 hours. Sepsis Labs: Recent Labs  Lab 08/30/20 0353 08/31/20 0434  WBC 3.4* 3.5*    Microbiology No results found for this or any previous visit (from the past 240 hour(s)).   Procedures and diagnostic studies:  No results found.             LOS: 14 days   Acacia Villas Copywriter, advertising on [email or web address]. If 7PM-7AM, please contact night-coverage at [email or web address]     09/05/2020, 11:53 AM

## 2020-09-05 NOTE — TOC Progression Note (Signed)
Transition of Care [facility]) - Progression Note    Patient Details  Name: [name] MRN: [id number] Date of Birth: [birth year]  [facility] CM/SW Contact  Shekelia Boutin, Juliann Pulse, RN Phone Number: 09/05/2020, 3:40 PM  Clinical Narrative:   Patient with no bed offers for SNF-PT to see tomorrow;OT order placed. From [place].    Expected Discharge Plan: [place] Barriers to Discharge: Continued Medical Work up  Expected Discharge Plan and Services Expected Discharge Plan: [place]   Discharge Planning Services: CM Consult Post Acute Care Choice: [place] arrangements for the past 2 months: [place]                                       Social Determinants of Health (SDOH) Interventions    Readmission Risk Interventions Readmission Risk Prevention Plan 01/12/2020  Transportation Screening Complete  Palliative Care Screening Not Applicable  Some recent data might be hidden

## 2020-09-06 DIAGNOSIS — I5033 Acute on chronic diastolic (congestive) heart failure: Secondary | ICD-10-CM | POA: Diagnosis not present

## 2020-09-06 LAB — GLUCOSE, CAPILLARY
Glucose-Capillary: 373 mg/dL — ABNORMAL HIGH (ref 70–99)
Glucose-Capillary: 395 mg/dL — ABNORMAL HIGH (ref 70–99)
Glucose-Capillary: 204 mg/dL — ABNORMAL HIGH (ref 70–99)
Glucose-Capillary: 101 mg/dL — ABNORMAL HIGH (ref 70–99)
Glucose-Capillary: 97 mg/dL (ref 70–99)

## 2020-09-06 LAB — BASIC METABOLIC PANEL
Anion gap: 7 (ref 5–15)
BUN: 44 mg/dL — ABNORMAL HIGH (ref 6–20)
CO2: 25 mmol/L (ref 22–32)
Calcium: 8.4 mg/dL — ABNORMAL LOW (ref 8.9–10.3)
Chloride: 104 mmol/L (ref 98–111)
Creatinine, Ser: 1.75 mg/dL — ABNORMAL HIGH (ref 0.61–1.24)
GFR, Estimated: 44 mL/min — ABNORMAL LOW (ref >60)
Glucose, Bld: 155 mg/dL — ABNORMAL HIGH (ref 70–99)
Potassium: 3.7 mmol/L (ref 3.5–5.1)
Sodium: 136 mmol/L (ref 135–145)

## 2020-09-06 LAB — SARS CORONAVIRUS 2 (TAT 6-24 HRS): SARS Coronavirus 2: NEGATIVE

## 2020-09-06 LAB — MAGNESIUM: Magnesium: 1.8 mg/dL (ref 1.7–2.4)

## 2020-09-06 MED ORDER — INSULIN GLARGINE 100 UNIT/ML ~~LOC~~ SOLN
30.0000 [IU] | Freq: Every day | SUBCUTANEOUS | Status: DC
  Filled 2020-09-06: qty 0.3

## 2020-09-06 MED ORDER — INSULIN ASPART 100 UNIT/ML IJ SOLN
5.0000 [IU] | Freq: Three times a day (TID) | INTRAMUSCULAR | Status: DC
  Administered 2020-09-06 – 2020-09-07 (×3): 5 [IU] via SUBCUTANEOUS

## 2020-09-06 NOTE — TOC Progression Note (Addendum)
Transition of Care [name]) - Progression Note    Patient Details  Name: DIXIE JAFRI MRN: [id number] Date of Birth: Nov 05, 1960  Transition of Care [facility]) CM/SW Contact  Eraina Winnie, Juliann Pulse, RN Phone Number: 09/06/2020, 1:03 PM  Clinical Narrative: Currently no bed offers for SNF most d/t currently needing asst of 3-4;must be only 2+asst consistently & participating in therapy. Also wt 312lbs.Will continue to check w/SNF.  1:12p-[facility] is reviewing. 2p-[facility]-started [place].    Expected Discharge Plan: [place] Barriers to Discharge: Continued Medical Work up  Expected Discharge Plan and Services Expected Discharge Plan: [place]   Discharge Planning Services: CM Consult Post Acute Care Choice: [place] arrangements for the past 2 months: [place]                                       Social Determinants of Health (SDOH) Interventions    Readmission Risk Interventions Readmission Risk Prevention Plan 01/12/2020  Transportation Screening Complete  Palliative Care Screening Not Applicable  Some recent data might be hidden

## 2020-09-06 NOTE — Progress Notes (Signed)
Physical Therapy Treatment Patient Details Name: [name] MRN: [id number] DOB: 1960/09/09 Today's Date: 09/06/2020    History of Present Illness [name] is a 60 year old man PMH including diastolic CHF, diabetes, morbid obesity, stroke presented with swelling, chest pain or shortness of breath.  Admitted for acute on chronic diastolic CHF.  Massive volume overload noted, cardiology consulted, continue diuresis, monitor renal function.    PT Comments    Patient assisted in rolling for BM incintinece. Patient does reach for rails. Due to body habitus and previous  therapy notes requiring 3 + assist, did not attempt sitting bed edge. Patient was lifted to recliner via maxisky and tolerated well. Able to tolerate sitting upright against recliner back for ~ 4 minutes. Performed A/AA exercises for both legs. Right knee extension strength tests 2+/5 which would not support standing. Patient states he performed a side transfer to power chair PTA and has ambulated but was not clear as to how far most recently.   Continue PT for mobility and strengthening and ROM.  Follow Up Recommendations  SNF     Equipment Recommendations  None recommended by PT    Recommendations for Other Services       Precautions / Restrictions Precautions Precautions: Fall Precaution Comments: scrotal, R UE  and LE edema painful Restrictions Weight Bearing Restrictions: No    Mobility  Bed Mobility Overal bed mobility: Needs Assistance Bed Mobility: Rolling Rolling: Max assist;+2 for physical assistance;+2 for safety/equipment         General bed mobility comments: patient does assist  rolling by reachin for rails and can  move each leg forward to facilitate rolling. Did not attempt sitting up ion bedside    Transfers                    Ambulation/Gait                 Stairs             Wheelchair Mobility    Modified Rankin (Stroke Patients Only)       Balance                                             Cognition Arousal/Alertness: Awake/alert Behavior During Therapy: WFL for tasks assessed/performed Overall Cognitive Status: Within Functional Limits for tasks assessed                                        Exercises General Exercises - Lower Extremity Ankle Circles/Pumps: AROM;Both Long Arc Quad: AROM;AAROM;Both;10 reps;Seated    General Comments General comments (skin integrity, edema, etc.): NT      Pertinent Vitals/Pain Faces Pain Scale: Hurts little more Pain Location: scrotum and bil LEs with activity Pain Descriptors / Indicators: Discomfort;Guarding Pain Intervention(s): Monitored during session    Home Living                      Prior Function            PT Goals (current goals can now be found in the care plan section) Acute Rehab PT Goals Time For Goal Achievement: 09/15/20 Potential to Achieve Goals: Fair Progress towards PT goals: Progressing toward goals    Frequency  Min 2X/week      PT Plan Current plan remains appropriate    Co-evaluation              AM-PAC PT "6 Clicks" Mobility   Outcome Measure  Help needed turning from your back to your side while in a flat bed without using bedrails?: Total Help needed moving from lying on your back to sitting on the side of a flat bed without using bedrails?: Total Help needed moving to and from a bed to a chair (including a wheelchair)?: Total Help needed standing up from a chair using your arms (e.g., wheelchair or bedside chair)?: Total Help needed to walk in hospital room?: Total Help needed climbing 3-5 steps with a railing? : Total 6 Click Score: 6    End of Session   Activity Tolerance: Patient limited by fatigue;Other (comment) (body habitus) Patient left: in chair;with call bell/phone within reach;with nursing/sitter in room Nurse Communication: Mobility status;Need for lift equipment PT Visit  Diagnosis: Muscle weakness (generalized) (M62.81);Other abnormalities of gait and mobility (R26.89)     Time: 2951-8841 PT Time Calculation (min) (ACUTE ONLY): 29 min  Charges:  $Therapeutic Activity: 8-22 mins                    Tresa Endo PT Acute Rehabilitation Services Pager [phone] Office [phone]  Claretha Cooper 09/06/2020, 10:18 AM

## 2020-09-06 NOTE — Evaluation (Signed)
Occupational Therapy Evaluation Patient Details Name: [name] MRN: [id number] DOB: [birth year] Today's Date: 09/06/2020    History of Present Illness [name] is a 60 year old man PMH including diastolic CHF, diabetes, morbid obesity, stroke presented with swelling, chest pain or shortness of breath.  Admitted for acute on chronic diastolic CHF.  Massive volume overload noted, cardiology consulted, continue diuresis, monitor renal function.   Clinical Impression   [name] presents with obesity, right sided hemiparesis, edema in right upper and lower extremity and scrotum, generalized weakness and decreased activity tolerance resulting in a sudden decline in baseline abilities. Patient typically able to transfer to powerchair, ambulate short distances with walker and perform ADLs with min guard/min assist. Patient currently limited by above deficits and requiring +2 assistance for rolling in bed, total assist for lower body dressing and toileting, max assist for bathing, min assist for UB dressing, and setup for grooming and feeding. Patient required hoyer lift to chair. Patient will benefit from skilled OT services while in hospital to improve deficits and learn compensatory strategies as needed in order to return to PLOF.  Patient educated to perform upper extremity active ROM exercises to promote strength and reduction of edema. Patient provided with squeeze ball and theraband (he requested to use on his leg). Patient highly motivated. Recommend short term rehab at discharge.     Follow Up Recommendations  SNF    Equipment Recommendations  Other (comment) (defer to next venue)    Recommendations for Other Services       Precautions / Restrictions Precautions Precautions: Fall Precaution Comments: scrotal, R UE  and LE edema painful Restrictions Weight Bearing Restrictions: No      Mobility Bed Mobility Overal bed mobility: Needs Assistance Bed Mobility:  Rolling Rolling: Max assist;+2 for physical assistance;+2 for safety/equipment   Supine to sit: Total assist;+2 for physical assistance;+2 for safety/equipment Sit to supine: Total assist;+2 for physical assistance;+2 for safety/equipment   General bed mobility comments: patient does assist  rolling by reachin for rails and can  move each leg forward to facilitate rolling. Did not attempt sitting up ion bedside    Transfers Overall transfer level: Needs assistance               General transfer comment: maxisky to transfer to recliner.    Balance                                           ADL either performed or assessed with clinical judgement   ADL Overall ADL's : Needs assistance/impaired Eating/Feeding: Set up;Sitting Eating/Feeding Details (indicate cue type and reason): up in chair Grooming: Set up;Sitting   Upper Body Bathing: Moderate assistance;Sitting   Lower Body Bathing: Total assistance;Bed level   Upper Body Dressing : Minimal assistance;Sitting   Lower Body Dressing: Total assistance;Bed level     Toilet Transfer Details (indicate cue type and reason): unable Toileting- Clothing Manipulation and Hygiene: Total assistance;+2 for physical assistance;+2 for safety/equipment;Bed level Toileting - Clothing Manipulation Details (indicate cue type and reason): +3 patient for pericare in bed after being found to be incontinent of BM             Vision Patient Visual Report: No change from baseline       Perception     Praxis      Pertinent Vitals/Pain Pain Assessment: Faces Faces Pain  Scale: Hurts little more Pain Location: scrotum and bil LEs with activity Pain Descriptors / Indicators: Discomfort;Guarding Pain Intervention(s): Limited activity within patient's tolerance;Monitored during session     Hand Dominance Right   Extremity/Trunk Assessment Upper Extremity Assessment Upper Extremity Assessment: RUE  deficits/detail;LUE deficits/detail RUE Deficits / Details: Grossly functional shoulder ROM to approx 80 degrees, 3+/5 strength, elbow strength 4-/5, wrist 4-/5, grip 3+/5; arm edamatous RUE Coordination: decreased fine motor;decreased gross motor LUE Deficits / Details: WFL ROM, 4/5 shoulder strenght, 5/5 elbow strength, 5/5 wrist, 4/5 grip LUE Sensation: WNL LUE Coordination: WNL   Lower Extremity Assessment Lower Extremity Assessment: Defer to PT evaluation   Cervical / Trunk Assessment Cervical / Trunk Assessment: Normal   Communication Communication Communication: No difficulties   Cognition Arousal/Alertness: Awake/alert Behavior During Therapy: WFL for tasks assessed/performed Overall Cognitive Status: Within Functional Limits for tasks assessed                                     General Comments  NT    Exercises General Exercises - Lower Extremity Ankle Circles/Pumps: AROM;Both Long Arc Quad: AROM;AAROM;Both;10 reps;Seated Other Exercises Other Exercises: Bicep curls x 20   Shoulder Instructions      Home Living Family/patient expects to be discharged to:: Assisted living                             Home Equipment: Walker - 4 wheels;Cane - single point;Wheelchair - power;Shower seat;Walker - 2 wheels   Additional Comments: Resides at [facility]. Accessible room with walk-in shower      Prior Functioning/Environment Level of Independence: Needs assistance  Gait / Transfers Assistance Needed: reports he transfers to/from power w/c by lateral scoot and sometimes requires assist from staff ADL's / Homemaking Assistance Needed: needs assist from staff for bathing; able to transfer himself from w/c to shower bench            OT Problem List: Decreased strength;Decreased activity tolerance;Impaired balance (sitting and/or standing);Decreased coordination;Decreased knowledge of use of DME or AE;Cardiopulmonary status limiting  activity;Pain;Impaired UE functional use;Obesity;Increased edema      OT Treatment/Interventions: Self-care/ADL training;Therapeutic exercise;DME and/or AE instruction;Patient/family education;Balance training;Therapeutic activities;Neuromuscular education    OT Goals(Current goals can be found in the care plan section) Acute Rehab OT Goals Patient Stated Goal: to live by himself OT Goal Formulation: With patient Time For Goal Achievement: 09/20/20 Potential to Achieve Goals: Good  OT Frequency: Min 2X/week   Barriers to D/C:            Co-evaluation              AM-PAC OT "6 Clicks" Daily Activity     Outcome Measure Help from another person eating meals?: A Little Help from another person taking care of personal grooming?: A Little Help from another person toileting, which includes using toliet, bedpan, or urinal?: Total Help from another person bathing (including washing, rinsing, drying)?: A Lot Help from another person to put on and taking off regular upper body clothing?: A Little Help from another person to put on and taking off regular lower body clothing?: Total 6 Click Score: 13   End of Session Equipment Utilized During Treatment: Other (comment) [place]) Nurse Communication: Mobility status;Need for lift equipment  Activity Tolerance: Patient tolerated treatment well Patient left: in chair;with call bell/phone within reach;with chair alarm  set  OT Visit Diagnosis: Other abnormalities of gait and mobility (R26.89);Muscle weakness (generalized) (M62.81);Hemiplegia and hemiparesis;Pain Hemiplegia - Right/Left: Right Hemiplegia - dominant/non-dominant: Dominant Hemiplegia - caused by: Cerebral infarction                Time: 0938-1829 OT Time Calculation (min): 37 min Charges:  OT General Charges $OT Visit: 1 Visit OT Evaluation $OT Eval Moderate Complexity: 1 Mod Earland Reish, OTR/L [place]  Office [phone] Pager: [phone]    [name] 09/06/2020, 11:56 AM

## 2020-09-06 NOTE — Progress Notes (Signed)
PROGRESS NOTE    [name]  ERD:[id number] DOB: [birth year] DOA: 08/21/2020 PCP: [name], NP   Chief Complaint  Patient presents with   Chest Pain   Leg Swelling    Brief Narrative: 60 year old male with chronic diastolic CHF, diabetes, morbid obesity, history of presented with chest pain shortness of breath swelling.  He was found to have acute on chronic diastolic CHF with anasarca, was placed on IV Lasix 60 and was admitted.  He also had AKI and creatinine improved with IV diuresis.  He developed colonic ileus that improved with conservative management.  He was seen by PT OT and has advised skilled nursing facility.  Subjective: Seen this morning resting comfortably on the bedside chair.  Assessment & Plan:  Acute on chronic heart failure with preserved ejection fraction Anasarca with lower extremity swelling Patient is clinically improved with IV Lasix continue on 40 mg every 12 for now monitor intake output Daily weight 2D echo was reviewed EF 65 to 70% G2 DD.  Wt si down 330>> 213 lb, leg swelling much improved.    AKI on CKD stage IIIa creatinine improved and stable at 1.7 now. Monitor Recent Labs  Lab 09/01/20 0759 09/02/20 0721 09/04/20 0411 09/05/20 0917 09/06/20 0818  BUN 38* 40* 43* 43* 44*  CREATININE 1.69* 1.74* 1.73* 1.61* 1.75*   Ileus CT abdomen pelvis 7/7 no evidence of bowel obstruction or segment volvulus managed conservatively improved continue diet as tolerated GI signed off  RUE swelling negative for DVT on duplex  Recent diarrhea resolved GI panel C. difficile negative  Stroke history with right-sided weakness continue aspirin and Lipitor, awaiting SNF  Pericardial effusion is stable with no signs of cardiac tamponade  Depression-mood stable  BPH-stable  Type 2 diabetes, A1c poorly controled 9.1. sugar poorly controlled running in 300s this morning.  On Lantus 20 units increased to home dose of 30 units and resume his Premeal  insulin at 5 units 3 times daily normally on 2 units ,cont ssi Lab Results  Component Value Date   HGBA1C 9.1 (H) 06/25/2020    Recent Labs  Lab 09/05/20 1127 09/05/20 1558 09/05/20 2150 09/06/20 0739 09/06/20 1139  GLUCAP 190* 174* 187* 373* 395*    Anemia of chronic disease-hh stable  Debility/physical deconditioning: PT OT to continue to work with him needs a skilled nursing facility   Morbid obesity BMI 47 will benefit weight loss, outpatient PCP follow-up  Essential hypertension: BP fairly controlled, continue amlodipine Coreg Imdur.  Diet Order             DIET SOFT Room service appropriate? Yes; Fluid consistency: Thin  Diet effective now                 Patient's Body mass index is 47.6 kg/m.  DVT prophylaxis: enoxaparin (LOVENOX) injection 40 mg Start: 08/22/20 1000 Code Status:   Code Status: Full Code  Family Communication: plan of care discussed with patient at bedside. Status is: Inpatient Remains inpatient appropriate because:Inpatient level of care appropriate due to severity of illness Dispo: The patient is from: ALF              Anticipated d/c is to: SNF              Patient currently is medically stable to d/c.   Difficult to place patient No       Unresulted Labs (From admission, onward)    None       Medications reviewed:  Scheduled Meds:  amLODipine  10 mg Oral Daily   aspirin  81 mg Oral Daily   atorvastatin  80 mg Oral QHS   carvedilol  12.5 mg Oral BID WC   enoxaparin (LOVENOX) injection  40 mg Subcutaneous Q24H   furosemide  40 mg Intravenous BID   insulin aspart  0-9 Units Subcutaneous TID WC   insulin aspart  3 Units Subcutaneous TID WC   insulin glargine  20 Units Subcutaneous QHS   isosorbide-hydrALAZINE  1 tablet Oral TID   pantoprazole  40 mg Oral Daily   polyethylene glycol  17 g Oral BID   potassium chloride  20 mEq Oral BID   pregabalin  75 mg Oral BID   saccharomyces boulardii  250 mg Oral BID   sodium  chloride flush  3 mL Intravenous Q12H   tamsulosin  0.8 mg Oral Daily   Continuous Infusions:  sodium chloride 250 mL (08/30/20 1403)   Consultants:see note  Procedures:see note Antimicrobials: Anti-infectives (From admission, onward)    None      Culture/Microbiology    Component Value Date/Time   SDES  03/08/2020 2201    BLOOD LEFT FOREARM Performed at [facility], [place] [address], [place], [place] [address]    [place]  03/08/2020 2201    BOTTLES DRAWN AEROBIC AND ANAEROBIC Blood Culture adequate volume Performed at [facility] [address] [place], [place] [address]    CULT  03/08/2020 2201    NO GROWTH 5 DAYS Performed at [facility] [address], [place],  [address]    REPTSTATUS 03/13/2020 FINAL 03/08/2020 2201    Other culture-see note  Objective: Vitals: Today's Vitals   09/06/20 0343 09/06/20 0345 09/06/20 0827 09/06/20 1232  BP: (!) 144/83   (!) 136/91  Pulse: 76   71  Resp: 19   18  Temp: 98.6 F (37 C)   (!) 97.5 F (36.4 C)  TempSrc: Oral   Oral  SpO2: 98%   96%  Weight:  (!) 142 kg    Height:      PainSc:   0-No pain     Intake/Output Summary (Last 24 hours) at 09/06/2020 1251 Last data filed at 09/06/2020 0827 Gross per 24 hour  Intake 800 ml  Output 1550 ml  Net -750 ml   Filed Weights   09/04/20 0500 09/05/20 0500 09/06/20 0345  Weight: (!) 144.6 kg (!) 141.7 kg (!) 142 kg   Weight change: 0.3 kg  Intake/Output from previous day: 07/12 0701 - 07/13 0700 In: 840 [P.O.:840] Out: 2350 [Urine:2350] Intake/Output this shift: Total I/O In: 200 [P.O.:200] Out: -  Filed Weights   09/04/20 0500 09/05/20 0500 09/06/20 0345  Weight: (!) 144.6 kg (!) 141.7 kg (!) 142 kg   Examination: General exam: AAO x3, pleasant, obese, HEENT:Oral mucosa moist, Ear/Nose WNL grossly,dentition normal. Respiratory system: bilaterally diminished, no use of accessory muscle, non tender. Cardiovascular  system: S1 & S2 +, no murmur no JVD. Gastrointestinal system: Abdomen soft, NT,ND, BS+. Nervous System:Alert, awake, moving extremities Extremities: Chronic appearing leg edema with hyperpigmentation, distal peripheral pulses palpable.  Skin: No rashes,no icterus. MSK: Normal muscle bulk,tone, power Data Reviewed: I have personally reviewed following labs and imaging studies CBC: Recent Labs  Lab 08/31/20 0434  WBC 3.5*  NEUTROABS 1.4*  HGB 8.7*  HCT 28.2*  MCV 80.6  PLT 829   Basic Metabolic Panel: Recent Labs  Lab 08/31/20 0434 09/01/20 0759 09/02/20 9371  09/04/20 0411 09/05/20 0917 09/06/20 0818  NA 135 136 137 136 139 136  K 4.3 4.2 4.2 3.8 3.8 3.7  CL 104 104 105 104 106 104  CO2 26 25 26 27 26 25   GLUCOSE 172* 160* 167* 155* 106* 155*  BUN 40* 38* 40* 43* 43* 44*  CREATININE 1.67* 1.69* 1.74* 1.73* 1.61* 1.75*  CALCIUM 8.3* 8.6* 8.3* 8.4* 8.7* 8.4*  MG 1.9 1.8 1.8  --  1.9 1.8  PHOS 3.9  --   --   --   --   --    GFR: Estimated Creatinine Clearance: 62.9 mL/min (A) (by C-G formula based on SCr of 1.75 mg/dL (H)). Liver Function Tests: No results for input(s): AST, ALT, ALKPHOS, BILITOT, PROT, ALBUMIN in the last 168 hours. No results for input(s): LIPASE, AMYLASE in the last 168 hours. No results for input(s): AMMONIA in the last 168 hours. Coagulation Profile: No results for input(s): INR, PROTIME in the last 168 hours. Cardiac Enzymes: No results for input(s): CKTOTAL, CKMB, CKMBINDEX, TROPONINI in the last 168 hours. BNP (last 3 results) No results for input(s): PROBNP in the last 8760 hours. HbA1C: No results for input(s): HGBA1C in the last 72 hours. CBG: Recent Labs  Lab 09/05/20 1127 09/05/20 1558 09/05/20 2150 09/06/20 0739 09/06/20 1139  GLUCAP 190* 174* 187* 373* 395*   Lipid Profile: No results for input(s): CHOL, HDL, LDLCALC, TRIG, CHOLHDL, LDLDIRECT in the last 72 hours. Thyroid Function Tests: No results for input(s): TSH, T4TOTAL,  FREET4, T3FREE, THYROIDAB in the last 72 hours. Anemia Panel: No results for input(s): VITAMINB12, FOLATE, FERRITIN, TIBC, IRON, RETICCTPCT in the last 72 hours. Sepsis Labs: No results for input(s): PROCALCITON, LATICACIDVEN in the last 168 hours.  Recent Results (from the past 240 hour(s))  SARS CORONAVIRUS 2 (TAT 6-24 HRS) Nasopharyngeal Nasopharyngeal Swab     Status: None   Collection Time: 09/05/20 11:54 AM   Specimen: Nasopharyngeal Swab  Result Value Ref Range Status   SARS Coronavirus 2 NEGATIVE NEGATIVE Final    Comment: (NOTE) SARS-CoV-2 target nucleic acids are NOT DETECTED.  The SARS-CoV-2 RNA is generally detectable in upper and lower respiratory specimens during the acute phase of infection. Negative results do not preclude SARS-CoV-2 infection, do not rule out co-infections with other pathogens, and should not be used as the sole basis for treatment or other patient management decisions. Negative results must be combined with clinical observations, patient history, and epidemiological information. The expected result is Negative.  Fact Sheet for Patients: SugarRoll.be  Fact Sheet for Healthcare Providers: https://www.woods-mathews.com/  This test is not yet approved or cleared by the [place] FDA and  has been authorized for detection and/or diagnosis of SARS-CoV-2 by FDA under an Emergency Use Authorization (EUA). This EUA will remain  in effect (meaning this test can be used) for the duration of the COVID-19 declaration under Se ction 564(b)(1) of the Act, 21 U.S.C. section 360bbb-3(b)(1), unless the authorization is terminated or revoked sooner.  Performed at [facility], [place] [address] [place], [place] [address]      Radiology Studies: No results found.   LOS: 15 days   Antonieta Pert, MD [facility]  09/06/2020, 12:51 PM

## 2020-09-07 LAB — BASIC METABOLIC PANEL
Anion gap: 7 (ref 5–15)
BUN: 47 mg/dL — ABNORMAL HIGH (ref 6–20)
CO2: 25 mmol/L (ref 22–32)
Calcium: 8.6 mg/dL — ABNORMAL LOW (ref 8.9–10.3)
Chloride: 104 mmol/L (ref 98–111)
Creatinine, Ser: 1.56 mg/dL — ABNORMAL HIGH (ref 0.61–1.24)
GFR, Estimated: 51 mL/min — ABNORMAL LOW (ref >60)
Glucose, Bld: 153 mg/dL — ABNORMAL HIGH (ref 70–99)
Potassium: 3.9 mmol/L (ref 3.5–5.1)
Sodium: 136 mmol/L (ref 135–145)

## 2020-09-07 LAB — GLUCOSE, CAPILLARY
Glucose-Capillary: 125 mg/dL — ABNORMAL HIGH (ref 70–99)
Glucose-Capillary: 148 mg/dL — ABNORMAL HIGH (ref 70–99)
Glucose-Capillary: 153 mg/dL — ABNORMAL HIGH (ref 70–99)
Glucose-Capillary: 155 mg/dL — ABNORMAL HIGH (ref 70–99)
Glucose-Capillary: 156 mg/dL — ABNORMAL HIGH (ref 70–99)
Glucose-Capillary: 157 mg/dL — ABNORMAL HIGH (ref 70–99)
Glucose-Capillary: 163 mg/dL — ABNORMAL HIGH (ref 70–99)
Glucose-Capillary: 206 mg/dL — ABNORMAL HIGH (ref 70–99)

## 2020-09-07 MED ORDER — CARVEDILOL 12.5 MG PO TABS: 12.5000 mg | ORAL_TABLET | Freq: Two times a day (BID) | ORAL | Status: DC

## 2020-09-07 MED ORDER — BASAGLAR KWIKPEN 100 UNIT/ML ~~LOC~~ SOPN: 20.0000 [IU] | PEN_INJECTOR | Freq: Every day | SUBCUTANEOUS | 0 refills | Status: DC

## 2020-09-07 MED ORDER — SACCHAROMYCES BOULARDII 250 MG PO CAPS: 250.0000 mg | ORAL_CAPSULE | Freq: Two times a day (BID) | ORAL | Status: DC

## 2020-09-07 MED ORDER — INSULIN GLARGINE 100 UNIT/ML ~~LOC~~ SOLN
15.0000 [IU] | Freq: Every day | SUBCUTANEOUS | Status: DC
  Administered 2020-09-07: 15 [IU] via SUBCUTANEOUS
  Filled 2020-09-07: qty 0.15

## 2020-09-07 MED ORDER — POTASSIUM CHLORIDE CRYS ER 20 MEQ PO TBCR: 20.0000 meq | EXTENDED_RELEASE_TABLET | Freq: Two times a day (BID) | ORAL | Status: DC

## 2020-09-07 MED ORDER — INSULIN ASPART 100 UNIT/ML ~~LOC~~ SOLN: 2.0000 [IU] | SUBCUTANEOUS | Status: DC

## 2020-09-07 MED ORDER — ISOSORB DINITRATE-HYDRALAZINE 20-37.5 MG PO TABS: 1.0000 | ORAL_TABLET | Freq: Three times a day (TID) | ORAL | Status: DC

## 2020-09-07 NOTE — Progress Notes (Addendum)
    BRIEF OVERNIGHT PROGRESS REPORT  Notified by RN for concern of CBG value and pending nighttime insulin dose. Insulin dosing was altered during primary daytime rounds. Patient has a CBG of 97 (at 2230 hrs) with 30 units of Lantus pending. Patient was ingesting a snack at that time (2230 hrs) and Lantus was held.  Follow up CBG was ordered at 0000 hrs and returned a value of 125. CBG will be repeated at 0300 hrs on 09/07/20.  Follow up : CBG at 0300 is [place] [facility] Acute Care Nurse Practitioner [place]

## 2020-09-07 NOTE — Discharge Summary (Signed)
Physician Discharge Summary  [name] WER:[id number] DOB: [birth year] DOA: 08/21/2020  PCP: [name], NP  Admit date: 08/21/2020 Discharge date: 09/07/2020  Admitted From: ALF Disposition:  snf  Recommendations for Outpatient Follow-up:  Follow up with PCP in 1-2 weeks Please obtain BMP/CBC in one week  Home Health:no  Equipment/Devices: none  Discharge Condition: Stable Code Status:   Code Status: Full Code Diet recommendation:  Diet Order             DIET SOFT Room service appropriate? Yes; Fluid consistency: Thin  Diet effective now           Diet - low sodium heart healthy                   Brief/Interim Summary: 60 year old male with chronic diastolic CHF, diabetes, morbid obesity, history of presented with chest pain shortness of breath swelling.  He was found to have acute on chronic diastolic CHF with anasarca, was placed on IV Lasix 60 and was admitted.  He also had AKI and creatinine improved with IV diuresis.  He developed colonic ileus that improved with conservative management.  He was seen by PT OT and has advised skilled nursing facility. Patient fluid status improved with IV Lasix.We will continue with home chf meds. And torsemide. Continue fluid restriction salt restriction.He is being discharged to skilled nursing facility pending bed available.  Discharge Diagnoses:  Acute on chronic heart failure with preserved ejection fraction Anasarca with lower extremity swelling Patient is clinically improved with IV Lasix- resume Torsemide on D/C.monitor wt daily. 2D echo was reviewed EF 65 to 70% G2 DD.  Wt is down 330>> 318 lb, leg swelling much improved.  Cont entresto, bidil, coreg, torsemide., hold aldactone for now and resume if bp and creat stable in follow up labs. F/u with [place] [facility].  AKI on CKD stage IIIa creatinine improved and stable at 1.5 now. Monitor, BMP in 1 wk while on torsemide. Recent Labs  Lab 09/02/20 0721 09/04/20 0411  09/05/20 0917 09/06/20 0818 09/07/20 0412  BUN 40* 43* 43* 44* 47*  CREATININE 1.74* 1.73* 1.61* 1.75* 1.56*   Ileus CT abdomen pelvis 7/7 no evidence of bowel obstruction or segment volvulus managed conservatively improved continue diet as tolerated GI signed off  RUE swelling negative for DVT on duplex  Recent diarrhea resolved GI panel C. difficile negative  Stroke history with right-sided weakness continue aspirin and Lipitor  Pericardial effusion is stable with no signs of cardiac tamponade  Depression-mood stable  BPH-stable  Type 2 diabetes, A1c poorly controled 9.1. he will resume lantus- he says he takes 20 units- can do 15, cont ssi.  Lab Results  Component Value Date   HGBA1C 9.1 (H) 06/25/2020    Recent Labs  Lab 09/06/20 2230 09/07/20 0005 09/07/20 0335 09/07/20 0606 09/07/20 0740  GLUCAP 97 125* 157* 163* 153*    Anemia of chronic disease-hh stable  Debility/physical deconditioning: PT OT to continue to work with him needs a skilled nursing facility   Morbid obesity BMI 47 will benefit weight loss, outpatient PCP follow-up  Essential hypertension: BP fairly controlled, continue amlodipine Coreg Imdur.  Patient's Body mass index is 48.37 kg/m.   Consults: TOC  Subjective: Alert,awake, oriented resting comfortably no new complaints  Discharge Exam: Vitals:   09/06/20 2019 09/07/20 0517  BP: 112/66 137/85  Pulse: 73 76  Resp: 18   Temp: 98.3 F (36.8 C) 97.9 F (36.6 C)  SpO2: 97% 99%  General: Pt is alert, awake, not in acute distress Cardiovascular: RRR, S1/S2 +, no rubs, no gallops Respiratory: CTA bilaterally, no wheezing, no rhonchi Abdominal: Soft, NT, ND, bowel sounds + Extremities: chronic appearing leg edema, no cyanosis  Discharge Instructions  Discharge Instructions     Diet - low sodium heart healthy   Complete by: As directed    Discharge instructions   Complete by: As directed    Please call call MD or return to  ER for similar or worsening recurring problem that brought you to hospital or if any fever,nausea/vomiting,abdominal pain, uncontrolled pain, chest pain,  shortness of breath or any other alarming symptoms.  Please follow-up your doctor as instructed in a week time and call the office for appointment.  Please avoid alcohol, smoking, or any other illicit substance and maintain healthy habits including taking your regular medications as prescribed.  You were cared for by a hospitalist during your hospital stay. If you have any questions about your discharge medications or the care you received while you were in the hospital after you are discharged, you can call the unit and ask to speak with the hospitalist on call if the hospitalist that took care of you is not available.  Once you are discharged, your primary care physician will handle any further medical issues. Please note that NO REFILLS for any discharge medications will be authorized once you are discharged, as it is imperative that you return to your primary care physician (or establish a relationship with a primary care physician if you do not have one) for your aftercare needs so that they can reassess your need for medications and monitor your lab values   Please monitor weight daily, avoid strain and stop activity anytime you have chest pain or worsening shortness of breath. Anytime you have any of the following symptoms: 1) 3 pound weight gain in 24 hours or 5 pounds in 1 week 2) shortness of breath, with or without a dry hacking cough 3) swelling in the hands, feet or stomach 4) if you have to sleep on extra pillows at night in order to breathe.   Discharge wound care:   Complete by: As directed    Keep the area dry and clean.   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   ammonium lactate 12 % lotion Commonly known as: LAC-HYDRIN Apply 1 application topically 2 (two) times daily. Both legs   aspirin 81 MG chewable tablet Chew 1 tablet (81 mg total) by mouth daily.   atorvastatin 80 MG tablet Commonly known as: LIPITOR Take 1 tablet (80 mg total) by mouth at bedtime.   Basaglar KwikPen 100 UNIT/ML Inject 20 Units into the skin at bedtime. What changed: how much to take   carvedilol 12.5 MG tablet Commonly known as: COREG Take 1 tablet (12.5 mg total) by mouth 2 (two) times daily  with a meal.   Ensure Take 237 mLs by mouth 2 (two) times daily between meals.   insulin aspart 100 UNIT/ML injection Commonly known as: novoLOG Inject 2-12 Units into the skin See admin instructions. Check and record blood sugar before meals and inject per ssi. 151-200 = 2 units  201-250 = 4 units  251-300 = 6 units  301-350 = 8 units  351-400 = 10 units  greater than 400 = 12 units and call MD.   isosorbide-hydrALAZINE 20-37.5 MG tablet Commonly known as: BIDIL Take 1 tablet by mouth 3 (three) times daily.   NON FORMULARY Take 237 mLs by mouth in the morning and at bedtime. ensure drink   pantoprazole 40 MG tablet Commonly known as: PROTONIX Take 40 mg by mouth daily.   potassium chloride SA 20 MEQ tablet Commonly known as: KLOR-CON Take 1 tablet (20 mEq total) by mouth 2 (two) times daily.   pregabalin 75 MG capsule Commonly known as: LYRICA Take 1 capsule (75 mg total) by mouth 2 (two) times daily.   saccharomyces boulardii 250 MG capsule Commonly known as: FLORASTOR Take 1 capsule (250 mg total) by mouth 2 (two) times  daily.   sacubitril-valsartan 49-51 MG Commonly known as: ENTRESTO Take 1 tablet by mouth 2 (two) times daily.   sertraline 50 MG tablet Commonly known as: ZOLOFT Take 50 mg by mouth daily.   tamsulosin 0.4 MG Caps capsule Commonly known as: FLOMAX Take 2 capsules (0.8 mg total) by mouth daily.   torsemide 20 MG tablet Commonly known as: Demadex Take 2 tablets (40 mg total) by mouth daily.       ASK your doctor about these medications    ondansetron 4 MG disintegrating tablet Commonly known as: Zofran ODT 4mg  ODT q4 hours prn nausea/vomit   polyethylene glycol 17 g packet Commonly known as: MIRALAX / GLYCOLAX Take 17 g by mouth daily as needed.               Discharge Care Instructions  (From admission, onward)           Start     Ordered   09/07/20 0000  Discharge wound care:       Comments: Keep the area dry and clean.   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    Microbiology: Recent Results (from the past 240 hour(s))  SARS CORONAVIRUS 2 (TAT 6-24 HRS) Nasopharyngeal Nasopharyngeal Swab     Status: None   Collection Time: 09/05/20 11:54 AM   Specimen: Nasopharyngeal Swab  Result Value Ref Range Status   SARS Coronavirus 2 NEGATIVE NEGATIVE Final    Comment: (NOTE) SARS-CoV-2 target nucleic acids are NOT DETECTED.  The SARS-CoV-2 RNA is generally detectable in upper and lower respiratory specimens during the acute phase of infection. Negative results do not preclude SARS-CoV-2 infection, do not rule out co-infections with other pathogens, and should not be used as the sole basis for treatment or other patient management decisions. Negative  results must be combined with clinical observations, patient history, and epidemiological information. The expected result is Negative.  Fact Sheet for Patients: SugarRoll.be  Fact Sheet for Healthcare Providers: https://www.woods-mathews.com/  This  test is not yet approved or cleared by the [place] and  has been authorized for detection and/or diagnosis of SARS-CoV-2 by FDA under an Emergency Use Authorization (EUA). This EUA will remain  in effect (meaning this test can be used) for the duration of the COVID-19 declaration under Se ction 564(b)(1) of the Act, 21 U.S.C. section 360bbb-3(b)(1), unless the authorization is terminated or revoked sooner.  Performed at [facility], [place] [address], [place], [place] [address]     Procedures/Studies: CT ABDOMEN PELVIS WO CONTRAST  Result Date: 08/30/2020 CLINICAL DATA:  Abdominal swelling. Query sigmoid volvulus. Follow-up scan from 06/24. EXAM: CT ABDOMEN AND PELVIS WITHOUT CONTRAST TECHNIQUE: Multidetector CT imaging of the abdomen and pelvis was performed following the standard protocol without IV contrast. COMPARISON:  08/18/2020 FINDINGS: Lower chest: Minimal bilateral pleural effusions. Motion artifact limits evaluation of the lungs but there appears to be some airspace disease, likely edema. Cardiac enlargement with small pericardial effusion. Hepatobiliary: Cholelithiasis with large stone in the gallbladder neck, unchanged. No focal liver lesions. Pancreas: Unremarkable. No pancreatic ductal dilatation or surrounding inflammatory changes. Spleen: Normal in size without focal abnormality. Adrenals/Urinary Tract: Adrenal glands are unremarkable. Kidneys are normal, without renal calculi, focal lesion, or hydronephrosis. Bladder is unremarkable. Stomach/Bowel: Stomach, small bowel, and colon are not abnormally distended. There is gas and stool in the colon but no evidence of sigmoid  volvulus currently. Appendix is not identified. Vascular/Lymphatic: Aortic atherosclerosis. No enlarged abdominal or pelvic lymph nodes. Reproductive: Prostate is unremarkable. Other: Small amount of free fluid in the pelvis, likely ascites. No free air. Extensive edema throughout the subcutaneous fat. Musculoskeletal: Degenerative changes in the spine. No destructive bone lesions. Examination is technically limited due to body habitus of the patient. IMPRESSION: 1. No evidence of bowel obstruction or sigmoid volvulus. 2. Cholelithiasis. 3. Small bilateral pleural effusions with probable edema in the lung bases. Cardiac enlargement with small pericardial effusion. 4. Diffuse soft tissue edema throughout the subcutaneous fat. 5. Aortic atherosclerosis. Electronically Signed   By: Lucienne Capers M.D.   On: 08/30/2020 19:36   DG Chest 1 View  Result Date: 08/18/2020 CLINICAL DATA:  Dyspnea EXAM: CHEST  1 VIEW COMPARISON:  08/16/2020 FINDINGS: Lungs volumes are small, but are symmetric and are clear. No pneumothorax or pleural effusion. Stable mild cardiomegaly. Pulmonary vascularity is normal. Osseous structures are age-appropriate. No acute bone abnormality. IMPRESSION: Pulmonary hypoinflation.  Stable cardiomegaly. Electronically Signed   By: Fidela Salisbury MD   On: 08/18/2020 00:05   DG Abd 1 View  Result Date: 08/30/2020 CLINICAL DATA:  Abdominal distention.  Pain. EXAM: ABDOMEN - 1 VIEW COMPARISON:  08/29/2020.  CT 08/18/2020. FINDINGS: Colonic distention again noted. Similar finding noted on prior studies including CT of 08/18/2020. The possibility of sigmoid volvulus cannot be excluded on today's exam and repeat CT of the abdomen and pelvis suggested. No free air identified. Right hemidiaphragm incompletely imaged. No acute bony abnormality. IMPRESSION: Colonic distention again noted. Similar findings noted on prior study including CT of 08/18/2020. The possibly of the sigmoid volvulus cannot be  excluded on today's exam and repeat CT of the abdomen and pelvis suggested. These results will be called to the ordering clinician or representative by the Radiologist Assistant, and communication documented in the PACS or [employer]. Electronically Signed   By: Marcello Moores  Register   On: 08/30/2020 11:46   DG Abd 1 View  Result Date: 08/29/2020 CLINICAL DATA:  Abdominal pain. EXAM: ABDOMEN - 1  VIEW COMPARISON:  Jun 26, 2020. FINDINGS: Mild gaseous distension of large and small bowel is noted suggesting ileus or less likely obstruction. No calcifications are noted IMPRESSION: Mild gaseous distention of large and small bowel is noted suggesting ileus or less likely obstruction. Follow-up radiographs recommended. Electronically Signed   By: Marijo Conception M.D.   On: 08/29/2020 12:38   CT ABDOMEN PELVIS W CONTRAST  Result Date: 08/18/2020 CLINICAL DATA:  Abdomen and groin pain and groin swelling. EXAM: CT ABDOMEN AND PELVIS WITH CONTRAST TECHNIQUE: Multidetector CT imaging of the abdomen and pelvis was performed using the standard protocol following bolus administration of intravenous contrast. CONTRAST:  143mL OMNIPAQUE IOHEXOL 300 MG/ML  SOLN COMPARISON:  07/11/2020 FINDINGS: Lower chest: Interval increase in size of a pericardial effusion, currently measuring 2.2 cm in maximum thickness. The heart is mildly enlarged without significant change. Prominent pulmonary vasculature is again demonstrated at the lung bases. No pleural fluid. Hepatobiliary: 2.7 cm gallstone in the gallbladder without gallbladder wall thickening or pericholecystic fluid. Unremarkable liver. Pancreas: Unremarkable. No pancreatic ductal dilatation or surrounding inflammatory changes. Spleen: Normal in size without focal abnormality. Adrenals/Urinary Tract: Adrenal glands are unremarkable. Kidneys are normal, without renal calculi, focal lesion, or hydronephrosis. Bladder is unremarkable. Stomach/Bowel: Dilated rectum and sigmoid colon  with an interval increase in caliber as well as prominent stool in the rectum and distal sigmoid colon. The remainder of the colon remains borderline dilated. Unremarkable stomach, small bowel and appendix. Vascular/Lymphatic: No significant vascular findings are present. No enlarged abdominal or pelvic lymph nodes. Reproductive: Prostate is unremarkable. Partially included bilateral hydroceles. Other: Extensive diffuse subcutaneous edema with mild progression. No free peritoneal fluid or hernia seen. Musculoskeletal: Lumbar and lower thoracic spine degenerative changes. IMPRESSION: 1. Extensive diffuse subcutaneous edema, mildly increased. 2. Partially included bilateral scrotal hydroceles. 3. Moderate-sized pericardial effusion, increased. 4. Increased stool in the rectum and distal sigmoid colon, causing partial functional obstruction of the colon. 5. Cholelithiasis without evidence of cholecystitis. Electronically Signed   By: Claudie Revering M.D.   On: 08/18/2020 10:01   DG Chest Portable 1 View  Result Date: 08/21/2020 CLINICAL DATA:  Central chest pressure. EXAM: PORTABLE CHEST 1 VIEW COMPARISON:  Chest x-ray dated August 17, 2020. FINDINGS: The heart size and mediastinal contours are within normal limits. Both lungs are clear. The visualized skeletal structures are unremarkable. IMPRESSION: No active disease. Electronically Signed   By: Titus Dubin M.D.   On: 08/21/2020 18:10   DG Chest Portable 1 View  Result Date: 08/16/2020 CLINICAL DATA:  Shortness of breath EXAM: PORTABLE CHEST 1 VIEW COMPARISON:  08/05/2020 FINDINGS: Stable cardiomegaly. Mild streaky bibasilar opacities, right slightly greater than left. No overt edema. No significant pleural fluid collection evident on semi upright view. No pneumothorax. IMPRESSION: Cardiomegaly with mild streaky bibasilar opacities, right slightly greater than left, may reflect atelectasis versus infiltrate. Electronically Signed   By: Davina Poke D.O.    On: 08/16/2020 11:07   VAS US RENAL ARTERY DUPLEX  Result Date: 08/23/2020 ABDOMINAL VISCERAL Patient Name:  LONZY MATO  Date of Exam:   08/23/2020 Medical Rec #: [id number]      Accession #:    [id number] Date of Birth: [birth year]       Patient Gender: M Patient Age:   60Y Exam Location:  [facility] Procedure:      VAS US RENAL ARTERY DUPLEX Referring Phys: [id number] [place] -------------------------------------------------------------------------------- Indications: Uncontrolled HTN Limitations: Obesity and Patient inability to cooperate  with positioning or breathing. Comparison Study: No previous exam Performing Technologist: Jody Hill RVT, RDMS  Examination Guidelines: A complete evaluation includes B-mode imaging, spectral Doppler, color Doppler, and power Doppler as needed of all accessible portions of each vessel. Bilateral testing is considered an integral part of a complete examination. Limited examinations for reoccurring indications may be performed as noted.  Duplex Findings: +----------------------+--------+--------+------+--------------+ Mesenteric            PSV cm/sEDV cm/sPlaque   Comments    +----------------------+--------+--------+------+--------------+ Aorta at SMA                                               +----------------------+--------+--------+------+--------------+ Aorta Prox               56      17                        +----------------------+--------+--------+------+--------------+ Celiac Artery Proximal                      Not visualized +----------------------+--------+--------+------+--------------+ SMA Proximal                                Not visualized +----------------------+--------+--------+------+--------------+    +------------------+--------+--------+--------------+ Right Renal ArteryPSV cm/sEDV cm/s   Comment     +------------------+--------+--------+--------------+ Origin                            Not  visualized +------------------+--------+--------+--------------+ Proximal                          Not visualized +------------------+--------+--------+--------------+ Mid                               Not visualized +------------------+--------+--------+--------------+ Distal                            Not visualized +------------------+--------+--------+--------------+ +-----------------+--------+--------+--------------+ Left Renal ArteryPSV cm/sEDV cm/s   Comment     +-----------------+--------+--------+--------------+ Origin                           Not visualized +-----------------+--------+--------+--------------+ Proximal                         Not visualized +-----------------+--------+--------+--------------+ Mid                              Not visualized +-----------------+--------+--------+--------------+ Distal                           Not visualized +-----------------+--------+--------+--------------+  Technologist observations: SEVERELY limited exam due to combination of body habitus and patient inability to cooperate for exam. Patient stated he could not position onto his side. Patient could not take a deep breath and hold. +------------+--------+--------+----+-----------+--------+--------+---+ Right KidneyPSV cm/sEDV cm/sRI  Left KidneyPSV cm/sEDV cm/sRI  +------------+--------+--------+----+-----------+--------+--------+---+ Upper Pole                      Upper Pole                     +------------+--------+--------+----+-----------+--------+--------+---+  Mid         43      10      0.76Mid                            +------------+--------+--------+----+-----------+--------+--------+---+ Lower Pole                      Lower Pole                     +------------+--------+--------+----+-----------+--------+--------+---+ Hilar       23      6       0.74Hilar                           +------------+--------+--------+----+-----------+--------+--------+---+ +------------------+-----+------------------+--------------+ Right Kidney           Left Kidney                      +------------------+-----+------------------+--------------+ RAR                    RAR                              +------------------+-----+------------------+--------------+ RAR (manual)           RAR (manual)                     +------------------+-----+------------------+--------------+ Cortex            0.68 Cortex            Not visualized +------------------+-----+------------------+--------------+ Cortex thickness       Corex thickness                  +------------------+-----+------------------+--------------+ Kidney length (cm)11.60Kidney length (cm)11.70          +------------------+-----+------------------+--------------+  Summary:  *See table(s) above for measurements and observations.  Diagnosing physician: Curt Jews MD  Electronically signed by Curt Jews MD on 08/23/2020 at 3:01:48 PM.    Final    VAS [place] UPPER EXTREMITY VENOUS DUPLEX  Result Date: 08/22/2020 UPPER VENOUS STUDY  Patient Name:  HOLLEY WIRT  Date of Exam:   08/22/2020 Medical Rec #: [id number]      Accession #:    [id number] Date of Birth: 16-Dec-1960       Patient Gender: M Patient Age:   60Y Exam Location:  [facility] Procedure:      VAS [place] UPPER EXTREMITY VENOUS DUPLEX Referring Phys: [place] --------------------------------------------------------------------------------  Indications: Swelling Risk Factors: None identified. Limitations: Poor ultrasound/tissue interface and body habitus. Comparison Study: No prior studies. Performing Technologist: Oliver Hum RVT  Examination Guidelines: A complete evaluation includes B-mode imaging, spectral Doppler, color Doppler, and power Doppler as needed of all accessible portions of each vessel. Bilateral testing is considered an integral part  of a complete examination. Limited examinations for reoccurring indications may be performed as noted.  Right Findings: +----------+------------+---------+-----------+----------+-------+ RIGHT     CompressiblePhasicitySpontaneousPropertiesSummary +----------+------------+---------+-----------+----------+-------+ IJV           Full       Yes       Yes                      +----------+------------+---------+-----------+----------+-------+ Subclavian    Full       Yes  Yes                      +----------+------------+---------+-----------+----------+-------+ Axillary      Full       Yes       Yes                      +----------+------------+---------+-----------+----------+-------+ Brachial      Full       Yes       Yes                      +----------+------------+---------+-----------+----------+-------+ Radial        Full                                          +----------+------------+---------+-----------+----------+-------+ Ulnar         Full                                          +----------+------------+---------+-----------+----------+-------+ Cephalic      Full                                          +----------+------------+---------+-----------+----------+-------+ Basilic       Full                                          +----------+------------+---------+-----------+----------+-------+  Left Findings: +----------+------------+---------+-----------+----------+-------+ LEFT      CompressiblePhasicitySpontaneousPropertiesSummary +----------+------------+---------+-----------+----------+-------+ Subclavian    Full       Yes       Yes                      +----------+------------+---------+-----------+----------+-------+  Summary:  Right: No evidence of deep vein thrombosis in the upper extremity. No evidence of superficial vein thrombosis in the upper extremity.  Left: No evidence of thrombosis in the subclavian.  *See  table(s) above for measurements and observations.  Diagnosing physician: Deitra Mayo MD Electronically signed by Deitra Mayo MD on 08/22/2020 at 2:55:09 PM.    Final    ECHOCARDIOGRAM LIMITED  Result Date: 08/22/2020    ECHOCARDIOGRAM LIMITED REPORT   Patient Name:   GAITHER BIEHN Date of Exam: 08/22/2020 Medical Rec #:  [id number]     Height:       68.0 in Accession #:    [id number]    Weight:       330.0 lb Date of Birth:  October 29, 1960      BSA:          2.528 m Patient Age:    57 years      BP:           138/89 mmHg Patient Gender: M             HR:           72 bpm. Exam Location:  Inpatient Procedure: Limited Echo, Limited Color Doppler, Cardiac Doppler and Intracardiac            Opacification Agent Indications:    Pericardial effusion I31.3  History:        Patient has prior history of Echocardiogram examinations, most                 recent 06/25/2020. CHF, Stroke, Aortic Valve Disease,                 Signs/Symptoms:Chest Pain, Murmur and Shortness of Breath; Risk                 Factors:Hypertension and Diabetes. Lower extremity edema.  Sonographer:    Darlina Sicilian RDCS Referring Phys: [place]  Sonographer Comments: Restricted mobility. IMPRESSIONS  1. Left ventricular ejection fraction, by estimation, is 65 to 70%. The left ventricle has normal function. The left ventricle has no regional wall motion abnormalities. There is moderate concentric left ventricular hypertrophy. Left ventricular diastolic parameters are consistent with Grade II diastolic dysfunction (pseudonormalization). Elevated left atrial pressure.  2. Left atrial size was moderately dilated.  3. A small pericardial effusion is present. The pericardial effusion is mostly localized near the right ventricle. There is no evidence of cardiac tamponade.  4. Disproportionately severe right coronary cusp calcification is seen. The aortic valve is tricuspid. There is moderate calcification of the aortic valve. There is  moderate thickening of the aortic valve. Aortic valve regurgitation is mild. Mild aortic  valve stenosis.  5. Tricuspid regurgitation signal is inadequate for assessing PA pressure.  6. The inferior vena cava is dilated in size with <50% respiratory variability, suggesting right atrial pressure of 15 mmHg. Comparison(s): Prior images reviewed side by side. The left ventricular function is unchanged. The left ventricular diastolic function is significantly worse. Filling pressures have increased. The pericardial effusion is unchanged or slightly smaller. FINDINGS  Left Ventricle: Left ventricular ejection fraction, by estimation, is 65 to 70%. The left ventricle has normal function. The left ventricle has no regional wall motion abnormalities. Definity contrast agent was given IV to delineate the left ventricular  endocardial borders. The left ventricular internal cavity size was normal in size. There is moderate concentric left ventricular hypertrophy. Left ventricular diastolic parameters are consistent with Grade II diastolic dysfunction (pseudonormalization).  Elevated left atrial pressure. Right Ventricle: Tricuspid regurgitation signal is inadequate for assessing PA pressure. Left Atrium: Left atrial size was moderately dilated. Right Atrium: Right atrial size was normal in size. Pericardium: A small pericardial effusion is present. The pericardial effusion is localized near the right ventricle. There is no evidence of cardiac tamponade. Aortic Valve: Disproportionately severe right coronary cusp calcification is seen. The aortic valve is tricuspid. There is moderate calcification of the aortic valve. There is moderate thickening of the aortic valve. Aortic valve regurgitation is mild. Mild aortic stenosis is present. Aortic valve mean gradient measures 9.0 mmHg. Aortic valve peak gradient measures 16.7 mmHg. Aorta: The aortic root and ascending aorta are structurally normal, with no evidence of dilitation.  Venous: The inferior vena cava is dilated in size with less than 50% respiratory variability, suggesting right atrial pressure of 15 mmHg. LEFT VENTRICLE PLAX 2D LVIDd:         4.90 cm      Diastology LVIDs:         2.97 cm      LV e' medial:    5.46 cm/s LV PW:         1.50 cm      LV E/e' medial:  14.5 LV IVS:        1.38 cm      LV  e' lateral:   5.86 cm/s                             LV E/e' lateral: 13.5  LV Volumes (MOD) LV vol d, MOD A2C: 92.2 ml LV vol d, MOD A4C: 174.0 ml LV vol s, MOD A2C: 48.3 ml  3D Volume EF: LV vol s, MOD A4C: 36.9 ml  3D EF:        68 % LV SV MOD A2C:     43.9 ml  LV EDV:       259 ml LV SV MOD A4C:     174.0 ml LV ESV:       82 ml LV SV MOD BP:      87.6 ml  LV SV:        177 ml LEFT ATRIUM         Index LA diam:    4.50 cm 1.78 cm/m  AORTIC VALVE AV Vmax:           204.50 cm/s AV Vmean:          137.500 cm/s AV VTI:            0.393 m AV Peak Grad:      16.7 mmHg AV Mean Grad:      9.0 mmHg LVOT Vmax:         72.60 cm/s LVOT Vmean:        47.600 cm/s LVOT VTI:          0.163 m LVOT/AV VTI ratio: 0.41 MITRAL VALVE MV Area (PHT): 2.93 cm    SHUNTS MV Decel Time: 259 msec    Systemic VTI: 0.16 m MV E velocity: 79.00 cm/s MV A velocity: 83.60 cm/s MV E/A ratio:  0.94 Mihai Croitoru MD Electronically signed by Sanda Klein MD Signature Date/Time: 08/22/2020/5:00:35 PM    Final     Labs: BNP (last 3 results) Recent Labs    08/16/20 1025 08/18/20 0123 08/21/20 1801  BNP 245.3* 139.2* 865.7*   Basic Metabolic Panel: Recent Labs  Lab 09/01/20 0759 09/02/20 0721 09/04/20 0411 09/05/20 0917 09/06/20 0818 09/07/20 0412  NA 136 137 136 139 136 136  K 4.2 4.2 3.8 3.8 3.7 3.9  CL 104 105 104 106 104 104  CO2 25 26 27 26 25 25   GLUCOSE 160* 167* 155* 106* 155* 153*  BUN 38* 40* 43* 43* 44* 47*  CREATININE 1.69* 1.74* 1.73* 1.61* 1.75* 1.56*  CALCIUM 8.6* 8.3* 8.4* 8.7* 8.4* 8.6*  MG 1.8 1.8  --  1.9 1.8  --    Liver Function Tests: No results for input(s): AST, ALT,  ALKPHOS, BILITOT, PROT, ALBUMIN in the last 168 hours. No results for input(s): LIPASE, AMYLASE in the last 168 hours. No results for input(s): AMMONIA in the last 168 hours. CBC: No results for input(s): WBC, NEUTROABS, HGB, HCT, MCV, PLT in the last 168 hours. Cardiac Enzymes: No results for input(s): CKTOTAL, CKMB, CKMBINDEX, TROPONINI in the last 168 hours. BNP: Invalid input(s): POCBNP CBG: Recent Labs  Lab 09/06/20 2230 09/07/20 0005 09/07/20 0335 09/07/20 0606 09/07/20 0740  GLUCAP 97 125* 157* 163* 153*   D-Dimer No results for input(s): DDIMER in the last 72 hours. Hgb A1c No results for input(s): HGBA1C in the last 72 hours. Lipid Profile No results for input(s): CHOL, HDL, LDLCALC, TRIG, CHOLHDL, LDLDIRECT in the last 72 hours. Thyroid function studies No results for input(s):  TSH, T4TOTAL, T3FREE, THYROIDAB in the last 72 hours.  Invalid input(s): FREET3 Anemia work up No results for input(s): VITAMINB12, FOLATE, FERRITIN, TIBC, IRON, RETICCTPCT in the last 72 hours. Urinalysis    Component Value Date/Time   COLORURINE STRAW (A) 08/16/2020 1453   APPEARANCEUR CLEAR 08/16/2020 1453   APPEARANCEUR Clear 12/31/2013 2116   LABSPEC 1.008 08/16/2020 1453   LABSPEC 1.032 12/31/2013 2116   PHURINE 6.0 08/16/2020 1453   GLUCOSEU 150 (A) 08/16/2020 1453   GLUCOSEU >=500 12/31/2013 2116   HGBUR SMALL (A) 08/16/2020 1453   BILIRUBINUR NEGATIVE 08/16/2020 1453   BILIRUBINUR Negative 12/31/2013 2116   KETONESUR NEGATIVE 08/16/2020 1453   PROTEINUR 100 (A) 08/16/2020 1453   UROBILINOGEN 1.0 08/28/2013 1709   NITRITE NEGATIVE 08/16/2020 1453   LEUKOCYTESUR NEGATIVE 08/16/2020 1453   LEUKOCYTESUR Negative 12/31/2013 2116   Sepsis Labs Invalid input(s): PROCALCITONIN,  WBC,  LACTICIDVEN Microbiology Recent Results (from the past 240 hour(s))  SARS CORONAVIRUS 2 (TAT 6-24 HRS) Nasopharyngeal Nasopharyngeal Swab     Status: None   Collection Time: 09/05/20 11:54 AM    Specimen: Nasopharyngeal Swab  Result Value Ref Range Status   SARS Coronavirus 2 NEGATIVE NEGATIVE Final    Comment: (NOTE) SARS-CoV-2 target nucleic acids are NOT DETECTED.  The SARS-CoV-2 RNA is generally detectable in upper and lower respiratory specimens during the acute phase of infection. Negative results do not preclude SARS-CoV-2 infection, do not rule out co-infections with other pathogens, and should not be used as the sole basis for treatment or other patient management decisions. Negative results must be combined with clinical observations, patient history, and epidemiological information. The expected result is Negative.  Fact Sheet for Patients: SugarRoll.be  Fact Sheet for Healthcare Providers: https://www.woods-mathews.com/  This test is not yet approved or cleared by the [place] FDA and  has been authorized for detection and/or diagnosis of SARS-CoV-2 by FDA under an Emergency Use Authorization (EUA). This EUA will remain  in effect (meaning this test can be used) for the duration of the COVID-19 declaration under Se ction 564(b)(1) of the Act, 21 U.S.C. section 360bbb-3(b)(1), unless the authorization is terminated or revoked sooner.  Performed at [facility], [place] [address] [place], [place] [address]      Time coordinating discharge: 35 minutes  SIGNED: Antonieta Pert, MD  [facility] 09/07/2020, 11:09 AM  If 7PM-7AM, please contact night-coverage [email or web address]

## 2020-09-07 NOTE — Progress Notes (Addendum)
CBG 97 when 30 units lantus ordered to be given at HS. Paged on-call provider. He stated to recheck the CBG at around midnight. CBG at this time was 125. Notified on-call provider of result. Will continue to monitor.   We will recheck CBG at 3am before giving the lantus. CBG at 3 was 152. Will recheck at 6 and not give lantus per provider on call.   CBG at 6am was 163. Notified the provider on call. Will continue to monitor.

## 2020-09-07 NOTE — TOC Progression Note (Signed)
Transition of Care [facility]) - Progression Note    Patient Details  Name: [name] MRN: [id number] Date of Birth: Jan 11, 1961  Transition [facility] Care [facility]) CM/SW Contact  Shandale Malak, Juliann Pulse, RN Phone Number: 09/07/2020, 1:35 PM  Clinical Narrative: Wandra Feinstein rep [place] awaiting auth-she has sent yesterdays PT/OT notes. D/c summary has been sent to [facility]. Likely CP will have outcome of auth tomorrow. MD updated.      Expected Discharge Plan: Skilled Nursing Facility Barriers to Discharge: Insurance Authorization  Expected Discharge Plan and Services Expected Discharge Plan: [place]   Discharge Planning Services: CM Consult Post Acute Care Choice: [place] arrangements for the past 2 months: [place] Expected Discharge Date: 09/07/20                                     Social Determinants of Health (SDOH) Interventions    Readmission Risk Interventions Readmission Risk Prevention Plan 01/12/2020  Transportation Screening Complete  Palliative Care Screening Not Applicable  Some recent data might be hidden

## 2020-09-08 LAB — GLUCOSE, CAPILLARY
Glucose-Capillary: 142 mg/dL — ABNORMAL HIGH (ref 70–99)
Glucose-Capillary: 125 mg/dL — ABNORMAL HIGH (ref 70–99)
Glucose-Capillary: 165 mg/dL — ABNORMAL HIGH (ref 70–99)
Glucose-Capillary: 189 mg/dL — ABNORMAL HIGH (ref 70–99)
Glucose-Capillary: 178 mg/dL — ABNORMAL HIGH (ref 70–99)

## 2020-09-08 MED ORDER — BASAGLAR KWIKPEN 100 UNIT/ML ~~LOC~~ SOPN: 5.0000 [IU] | PEN_INJECTOR | Freq: Every day | SUBCUTANEOUS | 0 refills | Status: DC

## 2020-09-08 MED ORDER — INSULIN GLARGINE 100 UNIT/ML ~~LOC~~ SOLN
5.0000 [IU] | Freq: Every day | SUBCUTANEOUS | Status: DC
  Administered 2020-09-08 – 2020-09-15 (×8): 5 [IU] via SUBCUTANEOUS
  Filled 2020-09-08 (×8): qty 0.05

## 2020-09-08 NOTE — Progress Notes (Signed)
Patient seen this morning resting comfortably no issues. Overnight Lantus held again . Decrease Lantus to 5 units rest of the medication stays the same, ongoing chronic issues.  He is stable for discharge to skilled nursing facility, waiting for authorization.

## 2020-09-08 NOTE — TOC Progression Note (Signed)
Transition of Care [facility]) - Progression Note    Patient Details  Name: [name] MRN: [id number] Date of Birth: 1960-05-14  Transition of Care [facility]) CM/SW Contact  Melah Ebling, Juliann Pulse, RN Phone Number: 09/08/2020, 9:42 AM  Clinical Narrative: Per Wandra Feinstein rep [place] awaiting Josem Kaufmann.      Expected Discharge Plan: Skilled Nursing Facility Barriers to Discharge: Insurance Authorization  Expected Discharge Plan and Services Expected Discharge Plan: [place]   Discharge Planning Services: CM Consult Post Acute Care Choice: [place] arrangements for the past 2 months: [place] Expected Discharge Date: 09/07/20                                     Social Determinants of Health (SDOH) Interventions    Readmission Risk Interventions Readmission Risk Prevention Plan 01/12/2020  Transportation Screening Complete  Palliative Care Screening Not Applicable  Some recent data might be hidden

## 2020-09-09 DIAGNOSIS — I5033 Acute on chronic diastolic (congestive) heart failure: Secondary | ICD-10-CM | POA: Diagnosis not present

## 2020-09-09 LAB — GLUCOSE, CAPILLARY
Glucose-Capillary: 135 mg/dL — ABNORMAL HIGH (ref 70–99)
Glucose-Capillary: 172 mg/dL — ABNORMAL HIGH (ref 70–99)
Glucose-Capillary: 180 mg/dL — ABNORMAL HIGH (ref 70–99)
Glucose-Capillary: 207 mg/dL — ABNORMAL HIGH (ref 70–99)

## 2020-09-09 MED ORDER — TORSEMIDE 20 MG PO TABS
40.0000 mg | ORAL_TABLET | Freq: Every day | ORAL | Status: DC
  Administered 2020-09-09 – 2020-09-12 (×4): 40 mg via ORAL
  Filled 2020-09-09 (×4): qty 2

## 2020-09-09 NOTE — Progress Notes (Signed)
CSW spoke with [name], she reported still waiting on auth, expecting to hear back on Monday.   Arlie Solomons.Illene Sweeting, MSW, [place]  Transitions of Care Clinical Social Worker I Direct Dial: [phone]  Fax: [phone] [email or web address]

## 2020-09-09 NOTE — Progress Notes (Signed)
PROGRESS NOTE    [name]  JGO:[id number] DOB: [birth year] DOA: 08/21/2020 PCP: [name], NP   Chief Complaint  Patient presents with   Chest Pain   Leg Swelling    Brief Narrative: 60 year old male with chronic diastolic CHF, diabetes, morbid obesity, history of presented with chest pain shortness of breath swelling.  He was found to have acute on chronic diastolic CHF with anasarca, was placed on IV Lasix 60 and was admitted.  He also had AKI and creatinine improved with IV diuresis.  He developed colonic ileus that improved with conservative management.  He was seen by PT OT and has advised skilled nursing facility. Patient fluid status improved with IV Lasix.We will continue with home chf meds including torsemide. Continue fluid restriction salt restriction.He is being discharged to skilled nursing facility pending bed available  Subjective: No acute events Blood sugar has been stable.  He has no new complaints. Is waiting for insurance approval to SNF.  Assessment & Plan:  Acute on chronic heart failure with preserved ejection fraction Anasarca with lower extremity swelling Patient is clinically improved with IV Lasix -switch to torsemide.Monitor intake output Daily weight 2D echo was reviewed EF 65 to 70% G2 DD.leg swelling much improved.    AKI on CKD stage IIIa creatinine improved and stable at 1.7 > 1.5. Monitor Recent Labs  Lab 09/04/20 0411 09/05/20 0917 09/06/20 0818 09/07/20 0412  BUN 43* 43* 44* 47*  CREATININE 1.73* 1.61* 1.75* 1.56*   Ileus CT abdomen pelvis 7/7 no evidence of bowel obstruction or segment volvulus managed conservatively .  Resolved, tolerated diet   RUE swelling negative for DVT on duplex  Recent diarrhea resolved GI panel C. difficile negative  Stroke history with right-sided weakness .  Stable continue home aspirin and Lipitor  Pericardial effusion is stable with no signs of cardiac tamponade  Depression-mood  stable  BPH-stable  Type 2 diabetes, A1c poorly controled 9.1. sugar is stabilized normally on Lantus 20 units now cut down to 5 units due to sugar being borderline. Cont ssi.  Lab Results  Component Value Date   HGBA1C 9.1 (H) 06/25/2020    Recent Labs  Lab 09/08/20 0250 09/08/20 0720 09/08/20 1131 09/08/20 1620 09/08/20 2122  GLUCAP 142* 125* 165* 189* 178*   Anemia of chronic disease-hh stable  Debility/physical deconditioning: PT OT to continue to work with him needs a skilled nursing facility   Morbid obesity BMI 47 will benefit weight loss, outpatient PCP follow-up  Essential hypertension: BP fairly controlled, continue amlodipine Coreg Imdur.  Diet Order             Diet heart healthy/carb modified Room service appropriate? Yes; Fluid consistency: Thin; Fluid restriction: 1500 mL Fluid  Diet effective now           Diet - low sodium heart healthy                 Patient's Body mass index is 47.9 kg/m.  DVT prophylaxis: enoxaparin (LOVENOX) injection 40 mg Start: 08/22/20 1000 Code Status:   Code Status: Full Code  Family Communication: plan of care discussed with patient at bedside. Status is: Inpatient Remains inpatient appropriate because:Inpatient level of care appropriate due to severity of illness Dispo: The patient is from: ALF              Anticipated d/c is to: SNF              Patient currently is medically stable  to d/c.   Difficult to place patient No  Unresulted Labs (From admission, onward)    None       Medications reviewed:  Scheduled Meds:  amLODipine  10 mg Oral Daily   aspirin  81 mg Oral Daily   atorvastatin  80 mg Oral QHS   carvedilol  12.5 mg Oral BID WC   enoxaparin (LOVENOX) injection  40 mg Subcutaneous Q24H   furosemide  40 mg Intravenous BID   insulin aspart  0-9 Units Subcutaneous TID WC   insulin glargine  5 Units Subcutaneous QHS   isosorbide-hydrALAZINE  1 tablet Oral TID   pantoprazole  40 mg Oral Daily    polyethylene glycol  17 g Oral BID   potassium chloride  20 mEq Oral BID   pregabalin  75 mg Oral BID   saccharomyces boulardii  250 mg Oral BID   sodium chloride flush  3 mL Intravenous Q12H   tamsulosin  0.8 mg Oral Daily   Continuous Infusions:  sodium chloride 250 mL (08/30/20 1403)   Consultants:see note  Procedures:see note Antimicrobials: Anti-infectives (From admission, onward)    None      Culture/Microbiology    Component Value Date/Time   SDES  03/08/2020 2201    BLOOD LEFT FOREARM Performed at [facility], [place] [address], [place], [place] [address]    [place]  03/08/2020 2201    BOTTLES DRAWN AEROBIC AND ANAEROBIC Blood Culture adequate volume Performed at [facility] [address] [place], [place] [address]    CULT  03/08/2020 2201    NO GROWTH 5 DAYS Performed at [facility] [address] [place], [place] [address]    REPTSTATUS 03/13/2020 FINAL 03/08/2020 2201    Other culture-see note  Objective: Vitals: Today's Vitals   09/08/20 1945 09/08/20 2122 09/09/20 0500 09/09/20 0512  BP:  135/81  118/75  Pulse:  73  75  Resp:  16  19  Temp:  98.4 F (36.9 C)  98.2 F (36.8 C)  TempSrc:  Oral  Oral  SpO2:  100%  100%  Weight:   (!) 142.9 kg   Height:      PainSc: 0-No pain       Intake/Output Summary (Last 24 hours) at 09/09/2020 0722 Last data filed at 09/09/2020 0518 Gross per 24 hour  Intake 963 ml  Output 1700 ml  Net -737 ml    Filed Weights   09/07/20 0517 09/08/20 0615 09/09/20 0500  Weight: (!) 144.3 kg (!) 143.4 kg (!) 142.9 kg   Weight change: -0.5 kg  Intake/Output from previous day: 07/15 0701 - 07/16 0700 In: 963 [P.O.:960; I.V.:3] Out: 1700 [Urine:1700] Intake/Output this shift: No intake/output data recorded. Filed Weights   09/07/20 0517 09/08/20 0615 09/09/20 0500  Weight: (!) 144.3 kg (!) 143.4 kg (!) 142.9 kg   Examination: General exam: AAOx 3, weak, obese. HEENT:Oral  mucosa moist, Ear/Nose WNL grossly, dentition normal. Respiratory system: bilaterally diminished, no aded sounds,no use of accessory muscle Cardiovascular system: S1 & S2 +, No JVD,. Gastrointestinal system: Abdomen soft,obese NT,ND, BS+ Nervous System:Alert, awake, moving extremities and grossly nonfocal Extremities: Chronic appearing edematous and hyperpigmented lower extremities,distal peripheral pulses palpable.  Skin: No rashes,no icterus. MSK: Normal muscle bulk,tone, power.   Data Reviewed: I have personally reviewed following labs and imaging studies CBC: No results for input(s): WBC, NEUTROABS, HGB, HCT, MCV, PLT in the last 168 hours.  Basic Metabolic Panel: Recent Labs  Lab 09/04/20 0411 09/05/20 0917 09/06/20 0818 09/07/20 0412  NA 136 139 136 136  K 3.8 3.8 3.7 3.9  CL 104 106 104 104  CO2 27 26 25 25   GLUCOSE 155* 106* 155* 153*  BUN 43* 43* 44* 47*  CREATININE 1.73* 1.61* 1.75* 1.56*  CALCIUM 8.4* 8.7* 8.4* 8.6*  MG  --  1.9 1.8  --     GFR: Estimated Creatinine Clearance: 70.8 mL/min (A) (by C-G formula based on SCr of 1.56 mg/dL (H)). Liver Function Tests: No results for input(s): AST, ALT, ALKPHOS, BILITOT, PROT, ALBUMIN in the last 168 hours. No results for input(s): LIPASE, AMYLASE in the last 168 hours. No results for input(s): AMMONIA in the last 168 hours. Coagulation Profile: No results for input(s): INR, PROTIME in the last 168 hours. Cardiac Enzymes: No results for input(s): CKTOTAL, CKMB, CKMBINDEX, TROPONINI in the last 168 hours. BNP (last 3 results) No results for input(s): PROBNP in the last 8760 hours. HbA1C: No results for input(s): HGBA1C in the last 72 hours. CBG: Recent Labs  Lab 09/08/20 0250 09/08/20 0720 09/08/20 1131 09/08/20 1620 09/08/20 2122  GLUCAP 142* 125* 165* 189* 178*    Lipid Profile: No results for input(s): CHOL, HDL, LDLCALC, TRIG, CHOLHDL, LDLDIRECT in the last 72 hours. Thyroid Function Tests: No results  for input(s): TSH, T4TOTAL, FREET4, T3FREE, THYROIDAB in the last 72 hours. Anemia Panel: No results for input(s): VITAMINB12, FOLATE, FERRITIN, TIBC, IRON, RETICCTPCT in the last 72 hours. Sepsis Labs: No results for input(s): PROCALCITON, LATICACIDVEN in the last 168 hours.  Recent Results (from the past 240 hour(s))  SARS CORONAVIRUS 2 (TAT 6-24 HRS) Nasopharyngeal Nasopharyngeal Swab     Status: None   Collection Time: 09/05/20 11:54 AM   Specimen: Nasopharyngeal Swab  Result Value Ref Range Status   SARS Coronavirus 2 NEGATIVE NEGATIVE Final    Comment: (NOTE) SARS-CoV-2 target nucleic acids are NOT DETECTED.  The SARS-CoV-2 RNA is generally detectable in upper and lower respiratory specimens during the acute phase of infection. Negative results do not preclude SARS-CoV-2 infection, do not rule out co-infections with other pathogens, and should not be used as the sole basis for treatment or other patient management decisions. Negative results must be combined with clinical observations, patient history, and epidemiological information. The expected result is Negative.  Fact Sheet for Patients: SugarRoll.be  Fact Sheet for Healthcare Providers: https://www.woods-mathews.com/  This test is not yet approved or cleared by the [place] FDA and  has been authorized for detection and/or diagnosis of SARS-CoV-2 by FDA under an Emergency Use Authorization (EUA). This EUA will remain  in effect (meaning this test can be used) for the duration of the COVID-19 declaration under Se ction 564(b)(1) of the Act, 21 U.S.C. section 360bbb-3(b)(1), unless the authorization is terminated or revoked sooner.  Performed at [facility], [place] [address], [place], [place] [address]       Radiology Studies: No results found.   LOS: 18 days   Antonieta Pert, MD [facility]  09/09/2020, 7:22 AM

## 2020-09-10 DIAGNOSIS — I5033 Acute on chronic diastolic (congestive) heart failure: Secondary | ICD-10-CM | POA: Diagnosis not present

## 2020-09-10 LAB — GLUCOSE, CAPILLARY
Glucose-Capillary: 154 mg/dL — ABNORMAL HIGH (ref 70–99)
Glucose-Capillary: 201 mg/dL — ABNORMAL HIGH (ref 70–99)
Glucose-Capillary: 178 mg/dL — ABNORMAL HIGH (ref 70–99)
Glucose-Capillary: 195 mg/dL — ABNORMAL HIGH (ref 70–99)

## 2020-09-10 NOTE — Progress Notes (Signed)
Physical Therapy Treatment Patient Details Name: [name] MRN: [id number] DOB: [birth year] Today's Date: 09/10/2020    History of Present Illness [name] is a 60 year old man PMH including diastolic CHF, diabetes, morbid obesity, stroke presented with swelling, chest pain or shortness of breath.  Admitted for acute on chronic diastolic CHF.  Massive volume overload noted, cardiology consulted, continue diuresis, monitor renal function.    PT Comments    Good session!. Co-tx with OT. Pt was motivated to participate. He was pleased to sit up on EOB. Worked on sitting balance and attempted standing to allow for some WBing on bil LEs. He continues to report pain (back, scrotum, R foot) but he worked through it on today. Assisted back to bed at end of session. Will continue to follow and progress activity as tolerated. Continue to recommend SNF for rehab.     Follow Up Recommendations  SNF     Equipment Recommendations  None recommended by PT    Recommendations for Other Services       Precautions / Restrictions Precautions Precautions: Fall Precaution Comments: scrotal, R UE  and LE edema painful Restrictions Weight Bearing Restrictions: No    Mobility  Bed Mobility Overal bed mobility: Needs Assistance Bed Mobility: Rolling;Supine to Sit;Sit to Supine Rolling: Max assist;+2 for physical assistance;+2 for safety/equipment   Supine to sit: Mod assist;+2 for physical assistance;+2 for safety/equipment;HOB elevated Sit to supine: Max assist;+2 for physical assistance;+2 for safety/equipment;HOB elevated   General bed mobility comments: Mod-Max A +2 for mobility. Increased time. Pt put forth good effort and participation on today. Used bedrail to assist as best he could. Utilized bedpad to aid with scooting, positioning. Cues for safety, technique.    Transfers Overall transfer level: Needs assistance   Transfers: Lateral/Scoot Transfers;Sit to/from Stand Sit to Stand:  Total assist;+2 physical assistance;+2 safety/equipment;From elevated surface        Lateral/Scoot Transfers: Max assist;+2 physical assistance;+2 safety/equipment;From elevated surface General transfer comment: Attempted sit to stand x 3-pt unable to stand. He WAS able to unweight bottom some to allow some WBing through bil LEs in controlled environment (+2 therapists/STEDY bar to pull up on). Lateral scoot towards HOB-Max +2 with use of bedpad to aid scooting.  Ambulation/Gait             General Gait Details: pt has been non amb for some time and uses a power wc for mobility at his ALF.   Stairs             Wheelchair Mobility    Modified Rankin (Stroke Patients Only)       Balance Overall balance assessment: Needs assistance Sitting-balance support: Single extremity supported;Bilateral upper extremity supported;Feet supported   Sitting balance - Comments: Fair static sitting balance. Poor dynamic sitting balance. Sat EOB for ~10 minutes while working on anterior weightshifting. Requires at least 1 UE support for safe sitting.                                    Cognition Arousal/Alertness: Awake/alert Behavior During Therapy: WFL for tasks assessed/performed Overall Cognitive Status: Within Functional Limits for tasks assessed                                        Exercises      General Comments  Pertinent Vitals/Pain Pain Assessment: Faces Faces Pain Scale: Hurts little more Pain Location: scrotum, back Pain Descriptors / Indicators: Grimacing;Discomfort;Sore Pain Intervention(s): Limited activity within patient's tolerance;Monitored during session;Repositioned    Home Living                      Prior Function            PT Goals (current goals can now be found in the care plan section) Progress towards PT goals: Progressing toward goals    Frequency    Min 2X/week      PT Plan Current  plan remains appropriate    Co-evaluation              AM-PAC PT "6 Clicks" Mobility   Outcome Measure  Help needed turning from your back to your side while in a flat bed without using bedrails?: Total Help needed moving from lying on your back to sitting on the side of a flat bed without using bedrails?: Total Help needed moving to and from a bed to a chair (including a wheelchair)?: Total Help needed standing up from a chair using your arms (e.g., wheelchair or bedside chair)?: Total Help needed to walk in hospital room?: Total Help needed climbing 3-5 steps with a railing? : Total 6 Click Score: 6    End of Session Equipment Utilized During Treatment: Gait belt Activity Tolerance: Patient limited by fatigue;Patient limited by pain Patient left: in bed;with call bell/phone within reach Nurse Communication: Need for lift equipment PT Visit Diagnosis: Muscle weakness (generalized) (M62.81);Other abnormalities of gait and mobility (R26.89)     Time: 3729-0211 PT Time Calculation (min) (ACUTE ONLY): 19 min  Charges:  $Therapeutic Activity: 8-22 mins                         Doreatha Massed, PT Acute Rehabilitation  Office: [phone] Pager: [phone]

## 2020-09-10 NOTE — Progress Notes (Signed)
PROGRESS NOTE    [name]  AQT:[id number] DOB: February 16, 1961 DOA: 08/21/2020 PCP: [name], NP   Chief Complaint  Patient presents with   Chest Pain   Leg Swelling    Brief Narrative: 60 year old male with chronic diastolic CHF, diabetes, morbid obesity, history of presented with chest pain shortness of breath swelling.  He was found to have acute on chronic diastolic CHF with anasarca, was placed on IV Lasix 60 and was admitted.  He also had AKI and creatinine improved with IV diuresis.  He developed colonic ileus that improved with conservative management.  He was seen by PT OT and has advised skilled nursing facility. Patient fluid status improved with IV Lasix.We will continue with home chf meds including torsemide. Continue fluid restriction salt restriction.He is being discharged to skilled nursing facility pending bed available. Medically stable pending bed  Subjective: No new complaints Asking about fluid restriction as he wants to drink more water Legs are chronically edematous, scrotal edema present but improving  Assessment & Plan:  Acute on chronic heart failure with preserved ejection fraction Anasarca with lower extremity swelling/scrotal edema Patient is clinically improved with IV Lasix - Now on torsemide 40 mg po since 7/16. Monitor intake output daily.2D echo was reviewed EF 65 to 70% G2 DD.leg swelling much improved.wt down at 311, on admission ? 330.  Total net  neg 14.6 liters. Responding well to torsemide urine output 2050 ml.  AKI on CKD stage IIIa creatinine improved and stable at 1.7 > 1.5. Monitor OP. Recent Labs  Lab 09/04/20 0411 09/05/20 0917 09/06/20 0818 09/07/20 0412  BUN 43* 43* 44* 47*  CREATININE 1.73* 1.61* 1.75* 1.56*   Ileus CT abdomen pelvis 7/7 no evidence of bowel obstruction or segment volvulus managed conservatively .  Resolved, tolerated diet   RUE swelling negative for DVT on duplex  Recent diarrhea resolved GI panel C.  difficile negative  Stroke history with right-sided weakness .  Stable continue home aspirin and Lipitor  Pericardial effusion is stable with no signs of cardiac tamponade  Depression-mood stable  BPH-stable  Type 2 diabetes, A1c poorly controled 9.1. sugar is stabilized normally on Lantus 20 units now cut down to 5 units due to sugar being borderline. Cont ssi.  Lab Results  Component Value Date   HGBA1C 9.1 (H) 06/25/2020    Recent Labs  Lab 09/08/20 2122 09/09/20 0743 09/09/20 1125 09/09/20 1628 09/09/20 2103  GLUCAP 178* 135* 172* 180* 207*   Anemia of chronic disease-hh stable  Debility/physical deconditioning: PT OT to continue to work with him needs a skilled nursing facility   Morbid obesity BMI 47 will benefit weight loss, outpatient PCP follow-up  Essential hypertension: Controlled on amlodipine Coreg Imdur.  Diet Order             Diet heart healthy/carb modified Room service appropriate? Yes; Fluid consistency: Thin; Fluid restriction: 1500 mL Fluid  Diet effective now           Diet - low sodium heart healthy                 Patient's Body mass index is 47.33 kg/m.  DVT prophylaxis: enoxaparin (LOVENOX) injection 40 mg Start: 08/22/20 1000 Code Status:   Code Status: Full Code  Family Communication: plan of care discussed with patient at bedside. Status is: Inpatient Remains inpatient appropriate because:Inpatient level of care appropriate due to severity of illness Dispo: The patient is from: ALF  Anticipated d/c is to: SNF once authorization received              Patient currently is medically stable to d/c.   Difficult to place patient No  Unresulted Labs (From admission, onward)    None       Medications reviewed:  Scheduled Meds:  amLODipine  10 mg Oral Daily   aspirin  81 mg Oral Daily   atorvastatin  80 mg Oral QHS   carvedilol  12.5 mg Oral BID WC   enoxaparin (LOVENOX) injection  40 mg Subcutaneous Q24H   insulin  aspart  0-9 Units Subcutaneous TID WC   insulin glargine  5 Units Subcutaneous QHS   isosorbide-hydrALAZINE  1 tablet Oral TID   pantoprazole  40 mg Oral Daily   polyethylene glycol  17 g Oral BID   potassium chloride  20 mEq Oral BID   pregabalin  75 mg Oral BID   saccharomyces boulardii  250 mg Oral BID   sodium chloride flush  3 mL Intravenous Q12H   tamsulosin  0.8 mg Oral Daily   torsemide  40 mg Oral Daily   Continuous Infusions:  sodium chloride 250 mL (08/30/20 1403)   Consultants:see note  Procedures:see note Antimicrobials: Anti-infectives (From admission, onward)    None      Culture/Microbiology    Component Value Date/Time   SDES  03/08/2020 2201    BLOOD LEFT FOREARM Performed at [facility], [place] [address] [place], [place] [address]    [place]  03/08/2020 2201    BOTTLES DRAWN AEROBIC AND ANAEROBIC Blood Culture adequate volume Performed at [facility] [address], [place], [place] [address]    CULT  03/08/2020 2201    NO GROWTH 5 DAYS Performed at [facility] [address] [place], [place] [address]    REPTSTATUS 03/13/2020 FINAL 03/08/2020 2201    Other culture-see note  Objective: Vitals: Today's Vitals   09/09/20 1326 09/09/20 2005 09/09/20 2104 09/10/20 0505  BP: 135/85  123/81 125/83  Pulse: 74  73 70  Resp: 15  18 18   Temp: 97.7 F (36.5 C)  97.8 F (36.6 C) 98.7 F (37.1 C)  TempSrc: Oral  Oral   SpO2: 100%  100% 96%  Weight:    (!) 141.2 kg  Height:      PainSc:  0-No pain      Intake/Output Summary (Last 24 hours) at 09/10/2020 0733 Last data filed at 09/10/2020 0400 Gross per 24 hour  Intake 600 ml  Output 2050 ml  Net -1450 ml    Filed Weights   09/08/20 0615 09/09/20 0500 09/10/20 0505  Weight: (!) 143.4 kg (!) 142.9 kg (!) 141.2 kg   Weight change: -1.7 kg  Intake/Output from previous day: 07/16 0701 - 07/17 0700 In: 600 [P.O.:600] Out: 2050 [Urine:2050] Intake/Output  this shift: No intake/output data recorded. Filed Weights   09/08/20 0615 09/09/20 0500 09/10/20 0505  Weight: (!) 143.4 kg (!) 142.9 kg (!) 141.2 kg   Examination: General exam: AAOx 3, older than stated age, weak appearing. HEENT:Oral mucosa moist, Ear/Nose WNL grossly, dentition normal. Respiratory system: bilaterally diminished, no added sounds, no use of accessory muscle Cardiovascular system: S1 & S2 +, No JVD,. Gastrointestinal system: Abdomen soft,M0, NT,ND, BS+ Nervous System:Alert, awake, moving extremities and grossly nonfocal Extremities:  Legs are chronically edematous, scrotal edema present but improving Skin: No rashes,no icterus. MSK: Normal muscle bulk,tone, power    Data  Reviewed: I have personally reviewed following labs and imaging studies CBC: No results for input(s): WBC, NEUTROABS, HGB, HCT, MCV, PLT in the last 168 hours.  Basic Metabolic Panel: Recent Labs  Lab 09/04/20 0411 09/05/20 0917 09/06/20 0818 09/07/20 0412  NA 136 139 136 136  K 3.8 3.8 3.7 3.9  CL 104 106 104 104  CO2 27 26 25 25   GLUCOSE 155* 106* 155* 153*  BUN 43* 43* 44* 47*  CREATININE 1.73* 1.61* 1.75* 1.56*  CALCIUM 8.4* 8.7* 8.4* 8.6*  MG  --  1.9 1.8  --     GFR: Estimated Creatinine Clearance: 70.3 mL/min (A) (by C-G formula based on SCr of 1.56 mg/dL (H)). Liver Function Tests: No results for input(s): AST, ALT, ALKPHOS, BILITOT, PROT, ALBUMIN in the last 168 hours. No results for input(s): LIPASE, AMYLASE in the last 168 hours. No results for input(s): AMMONIA in the last 168 hours. Coagulation Profile: No results for input(s): INR, PROTIME in the last 168 hours. Cardiac Enzymes: No results for input(s): CKTOTAL, CKMB, CKMBINDEX, TROPONINI in the last 168 hours. BNP (last 3 results) No results for input(s): PROBNP in the last 8760 hours. HbA1C: No results for input(s): HGBA1C in the last 72 hours. CBG: Recent Labs  Lab 09/08/20 2122 09/09/20 0743 09/09/20 1125  09/09/20 1628 09/09/20 2103  GLUCAP 178* 135* 172* 180* 207*    Lipid Profile: No results for input(s): CHOL, HDL, LDLCALC, TRIG, CHOLHDL, LDLDIRECT in the last 72 hours. Thyroid Function Tests: No results for input(s): TSH, T4TOTAL, FREET4, T3FREE, THYROIDAB in the last 72 hours. Anemia Panel: No results for input(s): VITAMINB12, FOLATE, FERRITIN, TIBC, IRON, RETICCTPCT in the last 72 hours. Sepsis Labs: No results for input(s): PROCALCITON, LATICACIDVEN in the last 168 hours.  Recent Results (from the past 240 hour(s))  SARS CORONAVIRUS 2 (TAT 6-24 HRS) Nasopharyngeal Nasopharyngeal Swab     Status: None   Collection Time: 09/05/20 11:54 AM   Specimen: Nasopharyngeal Swab  Result Value Ref Range Status   SARS Coronavirus 2 NEGATIVE NEGATIVE Final    Comment: (NOTE) SARS-CoV-2 target nucleic acids are NOT DETECTED.  The SARS-CoV-2 RNA is generally detectable in upper and lower respiratory specimens during the acute phase of infection. Negative results do not preclude SARS-CoV-2 infection, do not rule out co-infections with other pathogens, and should not be used as the sole basis for treatment or other patient management decisions. Negative results must be combined with clinical observations, patient history, and epidemiological information. The expected result is Negative.  Fact Sheet for Patients: SugarRoll.be  Fact Sheet for Healthcare Providers: https://www.woods-mathews.com/  This test is not yet approved or cleared by the [place] FDA and  has been authorized for detection and/or diagnosis of SARS-CoV-2 by FDA under an Emergency Use Authorization (EUA). This EUA will remain  in effect (meaning this test can be used) for the duration of the COVID-19 declaration under Se ction 564(b)(1) of the Act, 21 U.S.C. section 360bbb-3(b)(1), unless the authorization is terminated or revoked sooner.  Performed at [facility], [place] [address], [place], [place] [address]       Radiology Studies: No results found.   LOS: 19 days   Antonieta Pert, MD [facility]  09/10/2020, 7:33 AM

## 2020-09-10 NOTE — Progress Notes (Signed)
Occupational Therapy Treatment Patient Details Name: [name] MRN: [id number] DOB: 12/06/1960 Today's Date: 09/10/2020    History of present illness [name] is a 60 year old man PMH including diastolic CHF, diabetes, morbid obesity, stroke presented with swelling, chest pain or shortness of breath.  Admitted for acute on chronic diastolic CHF.  Massive volume overload noted, cardiology consulted, continue diuresis, monitor renal function.   OT comments  Co-treat with PT for functional mobility and activity tolerance. Patient motivated, pleasant and funny. Able to transfer and sit edge of be bed - though limited by LE and scrotal edema as well as back pain. Worked on leaning forward with use of stedy bar needed for sit to stand. Patient unable to power up despite multiple attempts and different height of bed. Returned to supine and worked on UE exercises while in bed. Therapist showed patient how to perform exercises with use of band and bed rails while in bed and encouraged patient to perform daily. Patient has right shoulder pain since stroke - more than likely from impingement. Patient educated to perform shoulder external rotation reps but not to push through perform. Patient verbalized understanding. Continue to recommend short term rehab.    Follow Up Recommendations  SNF    Equipment Recommendations  Other (comment) (TBD)    Recommendations for Other Services      Precautions / Restrictions Precautions Precautions: Fall Precaution Comments: scrotal, R UE  and LE edema painful Restrictions Weight Bearing Restrictions: No       Mobility Bed Mobility Overal bed mobility: Needs Assistance Bed Mobility: Rolling;Supine to Sit;Sit to Supine Rolling: Max assist;+2 for physical assistance;+2 for safety/equipment   Supine to sit: +2 for physical assistance;+2 for safety/equipment;HOB elevated;Mod assist Sit to supine: Max assist;+2 for physical assistance;+2 for  safety/equipment;HOB elevated   General bed mobility comments: Mod-Max A +2 for mobility. Increased time. Pt put forth good effort and participation on today. Used bedrail to assist as best he could. Utilized bedpad to aid with scooting, positioning. Cues for safety, technique. Limited by UE weakness (more so on Right)    Transfers Overall transfer level: Needs assistance   Transfers: Lateral/Scoot Transfers;Sit to/from Stand Sit to Stand: Total assist;+2 physical assistance;+2 safety/equipment;From elevated surface        Lateral/Scoot Transfers: +2 physical assistance;+2 safety/equipment;From elevated surface;Max assist General transfer comment: Attempted sit to stand x 3-pt unable to stand. He WAS able to unweight bottom some to allow some WBing through bil LEs in controlled environment (+2 therapists/STEDY bar to pull up on). Lateral scoot towards HOB-Max +2 with use of bedpad to aid scooting. Limited by UE weakness for powering up and increased edeam in scrotum and BLEs.    Balance Overall balance assessment: Needs assistance Sitting-balance support: Single extremity supported;Bilateral upper extremity supported;Feet supported   Sitting balance - Comments: Fair static sitting balance. Poor dynamic sitting balance. Sat EOB for ~10 minutes while working on anterior weightshifting. Requires at least 1 UE support for safe sitting.                                   ADL either performed or assessed with clinical judgement   ADL  Vision Patient Visual Report: No change from baseline     Perception     Praxis      Cognition Arousal/Alertness: Awake/alert Behavior During Therapy: WFL for tasks assessed/performed Overall Cognitive Status: Within Functional Limits for tasks assessed                                          Exercises Other Exercises Other Exercises: Bicep curls x 10  each arm with orange band, tricep extension x 10 each arm with orange band, RUE shoulder external rotation with orange band   Shoulder Instructions       General Comments      Pertinent Vitals/ Pain       Pain Assessment: Faces Faces Pain Scale: Hurts little more Pain Location: scrotum, back Pain Descriptors / Indicators: Grimacing;Discomfort;Sore Pain Intervention(s): Limited activity within patient's tolerance;Monitored during session  Home Living                                          Prior Functioning/Environment              Frequency  Min 2X/week        Progress Toward Goals  OT Goals(current goals can now be found in the care plan section)  Progress towards OT goals: Progressing toward goals  Acute Rehab OT Goals Patient Stated Goal: to stand and transfer to chair OT Goal Formulation: With patient Time For Goal Achievement: 09/20/20 Potential to Achieve Goals: [place] Discharge plan remains appropriate    Co-evaluation    PT/OT/SLP Co-Evaluation/Treatment: Yes   PT goals addressed during session: Mobility/safety with mobility OT goals addressed during session: Strengthening/ROM      AM-PAC OT "6 Clicks" Daily Activity     Outcome Measure   Help from another person eating meals?: A Little Help from another person taking care of personal grooming?: A Little Help from another person toileting, which includes using toliet, bedpan, or urinal?: Total Help from another person bathing (including washing, rinsing, drying)?: A Lot Help from another person to put on and taking off regular upper body clothing?: A Little Help from another person to put on and taking off regular lower body clothing?: Total 6 Click Score: 13    End of Session Equipment Utilized During Treatment: Other (comment) (stedy)  OT Visit Diagnosis: Other abnormalities of gait and mobility (R26.89);Muscle weakness (generalized) (M62.81);Hemiplegia and  hemiparesis;Pain Hemiplegia - Right/Left: Right Hemiplegia - dominant/non-dominant: Dominant Hemiplegia - caused by: Cerebral infarction Pain - Right/Left: Right Pain - part of body: Shoulder   Activity Tolerance     Patient Left in bed;with call bell/phone within reach;with bed alarm set   Nurse Communication Mobility status;Need for lift equipment        Time: 1123-1202 OT Time Calculation (min): 39 min  Charges: OT General Charges $OT Visit: 1 Visit OT Treatments $Therapeutic Activity: 8-22 mins $Therapeutic Exercise: 8-22 mins  [name], OTR/L [place]  Office [phone] Pager: [place] 09/10/2020, 3:50 PM

## 2020-09-10 NOTE — TOC Progression Note (Signed)
Transition of Care [facility]) - Progression Note    Patient Details  Name: [name] MRN: [id number] Date of Birth: 01-Aug-1960  Transition [facility]) CM/SW Contact  Marylouise Mallet, Juliann Pulse, RN Phone Number: 09/10/2020, 1:01 PM  Clinical Narrative:  Kristine Royal rep [place] awaiting auth-she will f/u tomorrow.     Expected Discharge Plan: Skilled Nursing Facility Barriers to Discharge: Insurance Authorization  Expected Discharge Plan and Services Expected Discharge Plan: [place]   Discharge Planning Services: CM Consult Post Acute Care Choice: [place] arrangements for the past 2 months: [place] Expected Discharge Date: 09/08/20                                     Social Determinants of Health (SDOH) Interventions    Readmission Risk Interventions Readmission Risk Prevention Plan 01/12/2020  Transportation Screening Complete  Palliative Care Screening Not Applicable  Some recent data might be hidden

## 2020-09-10 NOTE — Plan of Care (Signed)
  Problem: Education: Goal: Knowledge of General Education information will improve Description: Including pain rating scale, medication(s)/side effects and non-pharmacologic comfort measures Outcome: Adequate for Discharge   Problem: Clinical Measurements: Goal: Will remain free from infection Outcome: Adequate for Discharge Goal: Diagnostic test results will improve Outcome: Adequate for Discharge

## 2020-09-11 LAB — BASIC METABOLIC PANEL
Anion gap: 5 (ref 5–15)
BUN: 44 mg/dL — ABNORMAL HIGH (ref 6–20)
CO2: 25 mmol/L (ref 22–32)
Calcium: 8.7 mg/dL — ABNORMAL LOW (ref 8.9–10.3)
Chloride: 109 mmol/L (ref 98–111)
Creatinine, Ser: 1.43 mg/dL — ABNORMAL HIGH (ref 0.61–1.24)
GFR, Estimated: 56 mL/min — ABNORMAL LOW (ref >60)
Glucose, Bld: 177 mg/dL — ABNORMAL HIGH (ref 70–99)
Potassium: 3.6 mmol/L (ref 3.5–5.1)
Sodium: 139 mmol/L (ref 135–145)

## 2020-09-11 LAB — GLUCOSE, CAPILLARY
Glucose-Capillary: 158 mg/dL — ABNORMAL HIGH (ref 70–99)
Glucose-Capillary: 168 mg/dL — ABNORMAL HIGH (ref 70–99)
Glucose-Capillary: 180 mg/dL — ABNORMAL HIGH (ref 70–99)
Glucose-Capillary: 163 mg/dL — ABNORMAL HIGH (ref 70–99)

## 2020-09-11 NOTE — Plan of Care (Signed)

## 2020-09-11 NOTE — Progress Notes (Signed)
Patient seen this morning he is alert awake oriented he is upset about his low-salt fluid restricted diet Is voiding well on torsemide, overall leg edema scrotal edema much improved.  We will continue current plan of care is still for discharge to a skilled nursing facility pending placement.

## 2020-09-11 NOTE — Plan of Care (Signed)
  Problem: Education: Goal: Knowledge of General Education information will improve Description: Including pain rating scale, medication(s)/side effects and non-pharmacologic comfort measures Outcome: Progressing   Problem: Activity: Goal: Risk for activity intolerance will decrease Outcome: Progressing   

## 2020-09-11 NOTE — TOC Transition Note (Signed)
Transition of Care [facility]) - CM/SW Discharge Note   Patient Details  Name: [name] MRN: [id number] Date of Birth: September 01, 1960  Transition of Care [facility]) CM/SW Contact:  Ross Ludwig, LCSW Phone Number: 09/11/2020, 1:12 PM   Clinical Narrative:     CSW spoke to [facility] at [facility], she is still waiting on authorization.  CSW sent updated PT notes to SNF through hub.  CSW to continue to follow patient's progress throughout discharge planning.   Final next level of care: Skilled Nursing Facility Barriers to Discharge: Insurance Authorization   Patient Goals and CMS Choice Patient states their goals for this hospitalization and ongoing recovery are:: return back to [facility] CMS Medicare.gov Compare Post Acute Care list provided to:: Patient Choice offered to / list presented to : Patient  Discharge Placement                       Discharge Plan and Services   Discharge Planning Services: CM Consult Post Acute Care Choice: Home Health                               Social Determinants of Health (SDOH) Interventions     Readmission Risk Interventions Readmission Risk Prevention Plan 01/12/2020  Transportation Screening Complete  Palliative Care Screening Not Applicable  Some recent data might be hidden

## 2020-09-12 DIAGNOSIS — I5033 Acute on chronic diastolic (congestive) heart failure: Secondary | ICD-10-CM | POA: Diagnosis not present

## 2020-09-12 LAB — GLUCOSE, CAPILLARY
Glucose-Capillary: 134 mg/dL — ABNORMAL HIGH (ref 70–99)
Glucose-Capillary: 154 mg/dL — ABNORMAL HIGH (ref 70–99)
Glucose-Capillary: 160 mg/dL — ABNORMAL HIGH (ref 70–99)
Glucose-Capillary: 225 mg/dL — ABNORMAL HIGH (ref 70–99)

## 2020-09-12 MED ORDER — TORSEMIDE 20 MG PO TABS
40.0000 mg | ORAL_TABLET | Freq: Two times a day (BID) | ORAL | Status: DC
  Administered 2020-09-12 – 2020-09-22 (×20): 40 mg via ORAL
  Filled 2020-09-12 (×23): qty 2

## 2020-09-12 NOTE — TOC Progression Note (Signed)
Transition of Care [facility]) - Progression Note    Patient Details  Name: [name] MRN: [id number] Date of Birth: [birth year]  Transition of Care [facility]) CM/SW Contact  Rion Catala, Juliann Pulse, RN Phone Number: 09/12/2020, 9:16 AM  Clinical Narrative: Wandra Feinstein rep Ronald awaiting Josem Kaufmann.MD updated.      Expected Discharge Plan: Skilled Nursing Facility Barriers to Discharge: Insurance Authorization  Expected Discharge Plan and Services Expected Discharge Plan: [place]   Discharge Planning Services: CM Consult Post Acute Care Choice: [place] arrangements for the past 2 months: [place] Expected Discharge Date: 09/08/20                                     Social Determinants of Health (SDOH) Interventions    Readmission Risk Interventions Readmission Risk Prevention Plan 01/12/2020  Transportation Screening Complete  Palliative Care Screening Not Applicable  Some recent data might be hidden

## 2020-09-12 NOTE — Progress Notes (Signed)
Physical Therapy Treatment Patient Details Name: [name] MRN: [id number] DOB: 1960-10-15 Today's Date: 09/12/2020    History of Present Illness 60 year old male with chronic diastolic CHF, diabetes, morbid obesity, history of presented with chest pain shortness of breath swelling.  He was found to have acute on chronic diastolic CHF with anasarca, was placed on IV Lasix 60 and was admitted.  He also had AKI and creatinine improved with IV diuresis.  He developed colonic ileus that improved with conservative management.  He was seen by PT OT and has advised skilled nursing facility.  Patient fluid status improved with IV Lasix.We will continue with home chf meds including torsemide. Continue fluid restriction salt restriction.He is being discharged to skilled nursing facility pending bed available.    PT Comments    Pt AxO x 3 very motivated to "get stronger".  General bed mobility comments: side to side rolling to place Maxi Move pad under pt with some c/o back and B LE pain. General transfer comment: used Maxi Move to assist pt to recliner then worked on sitting balance TE's while in recliner which involoved pt's core and B UE's.  Also performed a few B LE TE's AAROM.  Attempted sit to stand partial "push ups" from recliner however pt unable due to weakness and BMI.General Gait Details: pt has been non amb for some time and uses a power wc for mobility at his ALF. Pt will need ST Rehab at SNF to regain his prior level of mobility/transfer ability.   Follow Up Recommendations  SNF     Equipment Recommendations  None recommended by PT    Recommendations for Other Services       Precautions / Restrictions Precautions Precaution Comments: scrotal, R UE  and LE edema (painful)    Mobility  Bed Mobility Overal bed mobility: Needs Assistance Bed Mobility: Rolling Rolling: Max assist;+2 for physical assistance;+2 for safety/equipment         General bed mobility comments: side to  side rolling to place Maxi Move pad under pt with some c/o back and B LE pain.    Transfers Overall transfer level: Needs assistance               General transfer comment: used Maxi Move to assist pt to recliner then worked on sitting balance TE's while in recliner which involoved pt's core and B UE's.  Also performed a few B LE TE's AAROM.  Attempted sit to stand partial "push ups" from recliner however pt unable due to weakness and BMI.  Ambulation/Gait             General Gait Details: pt has been non amb for some time and uses a power wc for mobility at his ALF.   Stairs             Wheelchair Mobility    Modified Rankin (Stroke Patients Only)       Balance                                            Cognition Arousal/Alertness: Awake/alert Behavior During Therapy: WFL for tasks assessed/performed Overall Cognitive Status: Within Functional Limits for tasks assessed                                 General Comments: AxO  x 3 present with some anxiety and control issues of how he does things.  Also present with poor medical insight.      Exercises   10 reps LAQ's AAROM 10 reps marching AAROM    General Comments        Pertinent Vitals/Pain Pain Assessment: Faces Faces Pain Scale: Hurts a little bit Pain Location: scrotum, back Pain Descriptors / Indicators: Grimacing;Discomfort;Sore Pain Intervention(s): Monitored during session;Repositioned    Home Living                      Prior Function            PT Goals (current goals can now be found in the care plan section) Progress towards PT goals: Progressing toward goals    Frequency    Min 2X/week      PT Plan Current plan remains appropriate    Co-evaluation              AM-PAC PT "6 Clicks" Mobility   Outcome Measure  Help needed turning from your back to your side while in a flat bed without using bedrails?: Total Help needed  moving from lying on your back to sitting on the side of a flat bed without using bedrails?: Total Help needed moving to and from a bed to a chair (including a wheelchair)?: Total Help needed standing up from a chair using your arms (e.g., wheelchair or bedside chair)?: Total Help needed to walk in hospital room?: Total Help needed climbing 3-5 steps with a railing? : Total 6 Click Score: 6    End of Session Equipment Utilized During Treatment: Gait belt Activity Tolerance: Patient limited by fatigue;Patient limited by pain Patient left: with call bell/phone within reach;in chair Nurse Communication: Need for lift equipment PT Visit Diagnosis: Muscle weakness (generalized) (M62.81);Other abnormalities of gait and mobility (R26.89)     Time: 8588-5027 PT Time Calculation (min) (ACUTE ONLY): 24 min  Charges:  $Therapeutic Exercise: 8-22 mins $Therapeutic Activity: 8-22 mins                     Rica Koyanagi  PTA Acute  Rehabilitation Services Pager      [phone] Office      [phone]

## 2020-09-12 NOTE — Plan of Care (Signed)
  Problem: Education: Goal: Knowledge of General Education information will improve Description Including pain rating scale, medication(s)/side effects and non-pharmacologic comfort measures Outcome: Progressing   

## 2020-09-12 NOTE — Progress Notes (Signed)
PROGRESS NOTE    [name]  FBP:[id number] DOB: 03-May-1960 DOA: 08/21/2020 PCP: [name], NP   Chief Complaint  Patient presents with   Chest Pain   Leg Swelling    Brief Narrative: 60 year old male with chronic diastolic CHF, diabetes, morbid obesity, history of presented with chest pain shortness of breath swelling.  He was found to have acute on chronic diastolic CHF with anasarca, was placed on IV Lasix 60 and was admitted.  He also had AKI and creatinine improved with IV diuresis.  He developed colonic ileus that improved with conservative management.  He was seen by PT OT and has advised skilled nursing facility. Patient fluid status improved with IV Lasix.We will continue with home chf meds including torsemide. Continue fluid restriction salt restriction.He is being discharged to skilled nursing facility pending bed available. Medically stable pending bed  Subjective: Seen and examined this morning.  He was unhappy about carbohydrate controlled diet.  Voiding well legs edema scrotal edema much better.  Waiting for placement.    Assessment & Plan:  Acute on chronic heart failure with preserved ejection fraction Anasarca with lower extremity swelling/scrotal edema Patient is clinically improved with IV Lasix - Now on torsemide 40 mg po since 7/16-voiding fairly well total -12.9 L.2D echo was reviewed EF 65 to 70% G2 DD.has chronic appearing leg edema.  wt down at 311> 1315> ?323 lb trending up??-Increase torsemide dose.  AKI on CKD stage IIIa creatinine improved to 1.4  Recent Labs  Lab 09/06/20 0818 09/07/20 0412 09/11/20 1022  BUN 44* 47* 44*  CREATININE 1.75* 1.56* 1.43*   Ileus CT abdomen pelvis 7/7 no evidence of bowel obstruction or segment volvulus managed conservatively .  Tolerating regular cardiac diet.   RUE swelling negative for DVT on duplex  Recent diarrhea resolved GI panel C. difficile negative.  No issues  Stroke history with right-sided  weakness .  At baseline status continue aspirin and Lipitor  Pericardial effusion is stable with no signs of cardiac tamponade  Depression-mood stable  BPH-stable  Type 2 diabetes, A1c poorly controled 9.1.  Blood sugar at this time well controlled while on Lantus 5 units and sliding scale  Lab Results  Component Value Date   HGBA1C 9.1 (H) 06/25/2020    Recent Labs  Lab 09/11/20 0713 09/11/20 1208 09/11/20 1604 09/11/20 1957 09/12/20 0712  GLUCAP 158* 168* 180* 163* 134*   Anemia of chronic disease-stable  Debility/physical deconditioning: Continue PT OT.  He is waiting for placement at this time.     Morbid obesity BMI 47 will benefit weight loss, outpatient PCP follow-up  Essential hypertension: stable on amlodipine Coreg Imdur.  Diet Order             Diet Heart Room service appropriate? Yes; Fluid consistency: Thin; Fluid restriction: 1500 mL Fluid  Diet effective now           Diet - low sodium heart healthy                 Patient's Body mass index is 49.18 kg/m.  DVT prophylaxis: enoxaparin (LOVENOX) injection 40 mg Start: 08/22/20 1000 Code Status:   Code Status: Full Code  Family Communication: plan of care discussed with patient at bedside. Status is: Inpatient Remains inpatient appropriate because:Inpatient level of care appropriate due to severity of illness Dispo: The patient is from: ALF              Anticipated d/c is to: SNF once  authorization received              Patient currently is medically stable to d/c.   Difficult to place patient No  Unresulted Labs (From admission, onward)    None       Medications reviewed:  Scheduled Meds:  amLODipine  10 mg Oral Daily   aspirin  81 mg Oral Daily   atorvastatin  80 mg Oral QHS   carvedilol  12.5 mg Oral BID WC   enoxaparin (LOVENOX) injection  40 mg Subcutaneous Q24H   insulin aspart  0-9 Units Subcutaneous TID WC   insulin glargine  5 Units Subcutaneous QHS   isosorbide-hydrALAZINE  1  tablet Oral TID   pantoprazole  40 mg Oral Daily   polyethylene glycol  17 g Oral BID   potassium chloride  20 mEq Oral BID   pregabalin  75 mg Oral BID   saccharomyces boulardii  250 mg Oral BID   sodium chloride flush  3 mL Intravenous Q12H   tamsulosin  0.8 mg Oral Daily   torsemide  40 mg Oral Daily   Continuous Infusions:  sodium chloride 250 mL (08/30/20 1403)   Consultants:see note  Procedures:see note Antimicrobials: Anti-infectives (From admission, onward)    None      Culture/Microbiology    Component Value Date/Time   SDES  03/08/2020 2201    BLOOD LEFT FOREARM Performed at [facility], [place] [address], [place], [place] [address]    [place]  03/08/2020 2201    BOTTLES DRAWN AEROBIC AND ANAEROBIC Blood Culture adequate volume Performed at [facility] [address] [place], [place] [address]    CULT  03/08/2020 2201    NO GROWTH 5 DAYS Performed at [facility] [address] [place], [place] [address]    REPTSTATUS 03/13/2020 FINAL 03/08/2020 2201    Other culture-see note  Objective: Vitals: Today's Vitals   09/11/20 2038 09/11/20 2119 09/12/20 0619 09/12/20 0723  BP:  (!) 153/111 135/84   Pulse:  69 73   Resp:  13 19   Temp:  98 F (36.7 C) 97.6 F (36.4 C)   TempSrc:  Oral    SpO2:  98% 97%   Weight:   (!) 146.7 kg   Height:      PainSc: 0-No pain   Asleep    Intake/Output Summary (Last 24 hours) at 09/12/2020 1042 Last data filed at 09/12/2020 0800 Gross per 24 hour  Intake 488 ml  Output 1300 ml  Net -812 ml    Filed Weights   09/10/20 0505 09/11/20 0600 09/12/20 0619  Weight: (!) 141.2 kg (!) 143.2 kg (!) 146.7 kg   Weight change: 3.5 kg  Intake/Output from previous day: 07/18 0701 - 07/19 0700 In: 840 [P.O.:840] Out: 1300 [Urine:1300] Intake/Output this shift: Total I/O In: 120 [P.O.:120] Out: -  Filed Weights   09/10/20 0505 09/11/20 0600 09/12/20 0619  Weight: (!) 141.2 kg (!)  143.2 kg (!) 146.7 kg   Examination: General exam: AAOx3, obese, NAD, calm. HEENT:Oral mucosa moist, Ear/Nose WNL grossly, dentition normal. Respiratory system: bilaterally clear breath sounds, no use of accessory muscle Cardiovascular system: S1 & S2 +, No JVD,. Gastrointestinal system: Abdomen soft, NT,ND, BS+ Nervous System:Alert, awake, moving extremities and grossly nonfocal Extremities: Chronic appearing lower leg edema, distal peripheral pulses palpable.  Skin: No rashes,no icterus. MSK: Normal muscle bulk,tone, power    Data Reviewed: I have personally reviewed following labs and imaging  studies CBC: No results for input(s): WBC, NEUTROABS, HGB, HCT, MCV, PLT in the last 168 hours.  Basic Metabolic Panel: Recent Labs  Lab 09/06/20 0818 09/07/20 0412 09/11/20 1022  NA 136 136 139  K 3.7 3.9 3.6  CL 104 104 109  CO2 25 25 25   GLUCOSE 155* 153* 177*  BUN 44* 47* 44*  CREATININE 1.75* 1.56* 1.43*  CALCIUM 8.4* 8.6* 8.7*  MG 1.8  --   --     GFR: Estimated Creatinine Clearance: 78.4 mL/min (A) (by C-G formula based on SCr of 1.43 mg/dL (H)). Liver Function Tests: No results for input(s): AST, ALT, ALKPHOS, BILITOT, PROT, ALBUMIN in the last 168 hours. No results for input(s): LIPASE, AMYLASE in the last 168 hours. No results for input(s): AMMONIA in the last 168 hours. Coagulation Profile: No results for input(s): INR, PROTIME in the last 168 hours. Cardiac Enzymes: No results for input(s): CKTOTAL, CKMB, CKMBINDEX, TROPONINI in the last 168 hours. BNP (last 3 results) No results for input(s): PROBNP in the last 8760 hours. HbA1C: No results for input(s): HGBA1C in the last 72 hours. CBG: Recent Labs  Lab 09/11/20 0713 09/11/20 1208 09/11/20 1604 09/11/20 1957 09/12/20 0712  GLUCAP 158* 168* 180* 163* 134*    Lipid Profile: No results for input(s): CHOL, HDL, LDLCALC, TRIG, CHOLHDL, LDLDIRECT in the last 72 hours. Thyroid Function Tests: No results for  input(s): TSH, T4TOTAL, FREET4, T3FREE, THYROIDAB in the last 72 hours. Anemia Panel: No results for input(s): VITAMINB12, FOLATE, FERRITIN, TIBC, IRON, RETICCTPCT in the last 72 hours. Sepsis Labs: No results for input(s): PROCALCITON, LATICACIDVEN in the last 168 hours.  Recent Results (from the past 240 hour(s))  SARS CORONAVIRUS 2 (TAT 6-24 HRS) Nasopharyngeal Nasopharyngeal Swab     Status: None   Collection Time: 09/05/20 11:54 AM   Specimen: Nasopharyngeal Swab  Result Value Ref Range Status   SARS Coronavirus 2 NEGATIVE NEGATIVE Final    Comment: (NOTE) SARS-CoV-2 target nucleic acids are NOT DETECTED.  The SARS-CoV-2 RNA is generally detectable in upper and lower respiratory specimens during the acute phase of infection. Negative results do not preclude SARS-CoV-2 infection, do not rule out co-infections with other pathogens, and should not be used as the sole basis for treatment or other patient management decisions. Negative results must be combined with clinical observations, patient history, and epidemiological information. The expected result is Negative.  Fact Sheet for Patients: SugarRoll.be  Fact Sheet for Healthcare Providers: https://www.woods-mathews.com/  This test is not yet approved or cleared by the [place] FDA and  has been authorized for detection and/or diagnosis of SARS-CoV-2 by FDA under an Emergency Use Authorization (EUA). This EUA will remain  in effect (meaning this test can be used) for the duration of the COVID-19 declaration under Se ction 564(b)(1) of the Act, 21 U.S.C. section 360bbb-3(b)(1), unless the authorization is terminated or revoked sooner.  Performed at [facility], [place] [address] [place], [place] [address]       Radiology Studies: No results found.   LOS: 21 days   Antonieta Pert, MD [facility]  09/12/2020, 10:42 AM

## 2020-09-13 LAB — GLUCOSE, CAPILLARY
Glucose-Capillary: 186 mg/dL — ABNORMAL HIGH (ref 70–99)
Glucose-Capillary: 227 mg/dL — ABNORMAL HIGH (ref 70–99)
Glucose-Capillary: 189 mg/dL — ABNORMAL HIGH (ref 70–99)
Glucose-Capillary: 219 mg/dL — ABNORMAL HIGH (ref 70–99)

## 2020-09-13 NOTE — Progress Notes (Signed)
OT Cancellation Note  Patient Details Name: [name] MRN: [id number] DOB: September 30, 1960   Cancelled Treatment:    Reason Eval/Treat Not Completed: Patient declined, no reason specified. Patient declined to participate in therapy today.  [name] 09/13/2020, 2:37 PM

## 2020-09-13 NOTE — TOC Progression Note (Addendum)
Transition of Care [facility]) - Progression Note    Patient Details  Name: BENCE TRAPP MRN: [id number] Date of Birth: [birth year]  [facility]) CM/SW Contact  Kelli Robeck, Juliann Pulse, RN Phone Number: 09/13/2020, 9:22 AM  Clinical Narrative: TC [facility](SNF) rep [place] call back for update on auth.   11:58a-Received call from [place] have a covid Diana Eves will let me know of alternative referral from their corporate office with a sister facility-await response;currently no auth.  0:24O-XBDZ vm w/[facility] rep Jackelyn Poling if able to accept-await response.   Expected Discharge Plan: Skilled Nursing Facility Barriers to Discharge: Insurance Authorization  Expected Discharge Plan and Services Expected Discharge Plan: [place]   Discharge Planning Services: CM Consult Post Acute Care Choice: [place] arrangements for the past 2 months: [place] Expected Discharge Date: 09/08/20                                     Social Determinants of Health (SDOH) Interventions    Readmission Risk Interventions Readmission Risk Prevention Plan 01/12/2020  Transportation Screening Complete  Palliative Care Screening Not Applicable  Some recent data might be hidden

## 2020-09-13 NOTE — Progress Notes (Signed)
Patient was seen and examined this morning.   Upset about waiting.   No change clinical situation.  He is tolerating torsemide, he will continue on current medication.  He is stable for discharge awaiting for skilled nursing facility placement

## 2020-09-13 NOTE — Plan of Care (Signed)
  Problem: Education: Goal: Knowledge of General Education information will improve Description: Including pain rating scale, medication(s)/side effects and non-pharmacologic comfort measures Outcome: Progressing   Problem: Clinical Measurements: Goal: Respiratory complications will improve Outcome: Progressing Goal: Cardiovascular complication will be avoided Outcome: Progressing   Problem: Safety: Goal: Ability to remain free from injury will improve Outcome: Progressing   Problem: Education: Goal: Ability to demonstrate management of disease process will improve Outcome: Progressing Goal: Ability to verbalize understanding of medication therapies will improve Outcome: Progressing Goal: Individualized Educational Video(s) Outcome: Progressing   Problem: Cardiac: Goal: Ability to achieve and maintain adequate cardiopulmonary perfusion will improve Outcome: Progressing

## 2020-09-14 DIAGNOSIS — I5033 Acute on chronic diastolic (congestive) heart failure: Secondary | ICD-10-CM | POA: Diagnosis not present

## 2020-09-14 LAB — BASIC METABOLIC PANEL
Anion gap: 11 (ref 5–15)
BUN: 40 mg/dL — ABNORMAL HIGH (ref 6–20)
CO2: 25 mmol/L (ref 22–32)
Calcium: 9.2 mg/dL (ref 8.9–10.3)
Chloride: 103 mmol/L (ref 98–111)
Creatinine, Ser: 1.54 mg/dL — ABNORMAL HIGH (ref 0.61–1.24)
GFR, Estimated: 52 mL/min — ABNORMAL LOW (ref >60)
Glucose, Bld: 190 mg/dL — ABNORMAL HIGH (ref 70–99)
Potassium: 3.8 mmol/L (ref 3.5–5.1)
Sodium: 139 mmol/L (ref 135–145)

## 2020-09-14 LAB — GLUCOSE, CAPILLARY
Glucose-Capillary: 199 mg/dL — ABNORMAL HIGH (ref 70–99)
Glucose-Capillary: 202 mg/dL — ABNORMAL HIGH (ref 70–99)
Glucose-Capillary: 176 mg/dL — ABNORMAL HIGH (ref 70–99)
Glucose-Capillary: 240 mg/dL — ABNORMAL HIGH (ref 70–99)

## 2020-09-14 MED ORDER — TORSEMIDE 40 MG PO TABS
40.0000 mg | ORAL_TABLET | Freq: Two times a day (BID) | ORAL | 0 refills | Status: DC
Start: 2020-09-14 — End: 2020-11-20

## 2020-09-14 NOTE — Progress Notes (Signed)
Physical Therapy Treatment Patient Details Name: [name] MRN: [id number] DOB: 05/24/1960 Today's Date: 09/14/2020    History of Present Illness 60 year old male with chronic diastolic CHF, diabetes, morbid obesity, history of presented with chest pain shortness of breath swelling.  He was found to have acute on chronic diastolic CHF with anasarca, was placed on IV Lasix 60 and was admitted.  He also had AKI and creatinine improved with IV diuresis.  He developed colonic ileus that improved with conservative management.  He was seen by PT OT and has advised skilled nursing facility.  Patient fluid status improved with IV Lasix.We will continue with home chf meds including torsemide. Continue fluid restriction salt restriction.He is being discharged to skilled nursing facility pending bed available.    PT Comments    Pt AxO x 3 very pleasant.  Assisted to EOB.  General bed mobility comments: assisted from supine to EOB + 2 Max/Total Assist. Pt tolerated seated static EOB x 7 min with much support B UE's.  Activity limited by ABD edema/girth and difficulty maintaining upright posture.  Posterior lean. General transfer comment: unable to attempt sit to stand due to poor sitting ability.  Pt unable to shift weight forward enough to initate sit to stand due to enlarged ABD edema.  Used [employer] lift.  Pt incont BM so lifted to [facility].  Pt had a large loose BM.  Assisted with peri care.  Lifted to recliner and positioned to comfort.      Follow Up Recommendations  SNF     Equipment Recommendations  None recommended by PT    Recommendations for Other Services       Precautions / Restrictions Precautions Precautions: Fall Precaution Comments: ABD edema    Mobility  Bed Mobility Overal bed mobility: Needs Assistance Bed Mobility: Rolling;Supine to Sit Rolling: Max assist;+2 for physical assistance;+2 for safety/equipment     Sit to supine: Max assist;+2 for physical assistance;+2 for  safety/equipment;HOB elevated   General bed mobility comments: assisted from supine to EOB + 2 Max/Total Assist. Pt tolerated seated static EOB x 7 min with much support B UE's.  Activity limited by ABD edema/girth and difficulty maintaining upright posture.  Posterior lean.    Transfers                 General transfer comment: unable to attempt sit to stand due to poor sitting ability.  Pt unable to shift weight forward enough to initate sit to stand due to enlarged ABD edema.  Ambulation/Gait             General Gait Details: pt has been non amb for some time and uses a power wc for mobility at his ALF.   Stairs             Wheelchair Mobility    Modified Rankin (Stroke Patients Only)       Balance                                            Cognition Arousal/Alertness: Awake/alert   Overall Cognitive Status: Within Functional Limits for tasks assessed                                 General Comments: AxOx 3 very pleasant      Exercises  General Comments        Pertinent Vitals/Pain Pain Assessment: Faces Faces Pain Scale: Hurts a little bit Pain Location: scrotum, back Pain Descriptors / Indicators: Grimacing;Discomfort;Sore Pain Intervention(s): Monitored during session;Repositioned    Home Living                      Prior Function            PT Goals (current goals can now be found in the care plan section) Progress towards PT goals: Progressing toward goals    Frequency    Min 2X/week      PT Plan Current plan remains appropriate    Co-evaluation              AM-PAC PT "6 Clicks" Mobility   Outcome Measure  Help needed turning from your back to your side while in a flat bed without using bedrails?: Total Help needed moving from lying on your back to sitting on the side of a flat bed without using bedrails?: Total Help needed moving to and from a bed to a chair  (including a wheelchair)?: Total Help needed standing up from a chair using your arms (e.g., wheelchair or bedside chair)?: Total Help needed to walk in hospital room?: Total Help needed climbing 3-5 steps with a railing? : Total 6 Click Score: 6    End of Session   Activity Tolerance: Patient tolerated treatment well Patient left: with call bell/phone within reach;in chair Nurse Communication: Need for lift equipment PT Visit Diagnosis: Muscle weakness (generalized) (M62.81);Other abnormalities of gait and mobility (R26.89)     Time: 1050-1130 PT Time Calculation (min) (ACUTE ONLY): 40 min  Charges:  $Therapeutic Activity: 38-52 mins                    Rica Koyanagi  PTA Acute  Rehabilitation Services Pager      [phone] Office      [phone]

## 2020-09-14 NOTE — Progress Notes (Signed)
PROGRESS NOTE    [name]  PXT:[id number] DOB: 06-07-1960 DOA: 08/21/2020 PCP: [name], NP   Chief Complaint  Patient presents with   Chest Pain   Leg Swelling    Brief Narrative: 60 year old male with chronic diastolic CHF, diabetes, morbid obesity, history of presented with chest pain shortness of breath swelling.  He was found to have acute on chronic diastolic CHF with anasarca, was placed on IV Lasix 60 and was admitted.  He also had AKI and creatinine improved with IV diuresis.  He developed colonic ileus that improved with conservative management.  He was seen by PT OT and has advised skilled nursing facility. Patient fluid status improved with IV Lasix.We will continue with home chf meds including torsemide. Continue fluid restriction salt restriction.He is being discharged to skilled nursing facility pending bed available. Medically stable pending bed  Subjective: Seen this morning was upset about waiting but he understands. His mood is nicely coming down his chronic edema is much better on increased dose of torsemide.  Assessment & Plan:  Acute on chronic heart failure with preserved ejection fraction Anasarca with lower extremity swelling/scrotal edema Patient is clinically improved with IV Lasix - Now on torsemide 40 mg po since 7/16-dose WAS increased due to uptrending weight. Voiding fairly well total -12. Liter neg, 2D echo was reviewed EF 65 to 70% G2 DD.has chronic appearing leg edema.  wt down at 311> 1315> 323> 316> 313, nicely downtrending.  Continue on increased dose of torsemide 40 mg bid.  Renal function is stable continue to check creatinine intermittently  AKI on CKD stage IIIa creatinine improved and stable at 1.4-1.5 as below.  Recent Labs  Lab 09/11/20 1022 09/14/20 0656  BUN 44* 40*  CREATININE 1.43* 1.54*   Ileus CT abdomen pelvis 7/7 no evidence of bowel obstruction or segment volvulus managed conservatively .  Tolerating diet well.     RUE swelling negative for DVT on duplex  Recent diarrhea resolved GI panel C. difficile negative.  No issues  Stroke history with right-sided weakness .  At baseline status continue aspirin and Lipitor  Pericardial effusion is stable with no signs of cardiac tamponade  Depression-mood stable  BPH-stable  Type 2 diabetes, A1c poorly controled 9.1.  Blood sugar is fairly stable continue on Lantus 5 u and SSI.  Recent Labs  Lab 09/13/20 0711 09/13/20 1152 09/13/20 1627 09/13/20 2005 09/14/20 0732  GLUCAP 186* 227* 189* 219* 199*  Anemia of chronic disease-stable.  Debility/physical deconditioning:Continue PT OT.He is waiting for placement at this time.     Morbid obesity BMI 47 will benefit weight loss, outpatient PCP follow-up  Essential hypertension: stable on amlodipine Coreg Imdur.  Diet Order             Diet Heart Room service appropriate? Yes; Fluid consistency: Thin; Fluid restriction: 1500 mL Fluid  Diet effective now           Diet - low sodium heart healthy                 Patient's Body mass index is 47.6 kg/m.  DVT prophylaxis: enoxaparin (LOVENOX) injection 40 mg Start: 08/22/20 1000 Code Status:   Code Status: Full Code  Family Communication: plan of care discussed with patient at bedside. Status is: Inpatient Remains inpatient appropriate because:Inpatient level of care appropriate due to severity of illness Dispo: The patient is from: ALF              Anticipated  d/c is to: SNF once authorization received              Patient currently is medically stable to d/c.   Difficult to place patient No  Unresulted Labs (From admission, onward)    None       Medications reviewed:  Scheduled Meds:  amLODipine  10 mg Oral Daily   aspirin  81 mg Oral Daily   atorvastatin  80 mg Oral QHS   carvedilol  12.5 mg Oral BID WC   enoxaparin (LOVENOX) injection  40 mg Subcutaneous Q24H   insulin aspart  0-9 Units Subcutaneous TID WC   insulin glargine   5 Units Subcutaneous QHS   isosorbide-hydrALAZINE  1 tablet Oral TID   pantoprazole  40 mg Oral Daily   polyethylene glycol  17 g Oral BID   potassium chloride  20 mEq Oral BID   pregabalin  75 mg Oral BID   saccharomyces boulardii  250 mg Oral BID   sodium chloride flush  3 mL Intravenous Q12H   tamsulosin  0.8 mg Oral Daily   torsemide  40 mg Oral BID   Continuous Infusions:  sodium chloride 250 mL (08/30/20 1403)   Consultants:see note  Procedures:see note Antimicrobials: Anti-infectives (From admission, onward)    None      Culture/Microbiology    Component Value Date/Time   SDES  03/08/2020 2201    BLOOD LEFT FOREARM Performed at [facility], [place] [address] [place], [place] [address]    [place]  03/08/2020 2201    BOTTLES DRAWN AEROBIC AND ANAEROBIC Blood Culture adequate volume Performed at [facility] [address], [place], [place] [address]    CULT  03/08/2020 2201    NO GROWTH 5 DAYS Performed at [facility] [address], [place], [place] [address]    REPTSTATUS 03/13/2020 FINAL 03/08/2020 2201    Other culture-see note  Objective: Vitals: Today's Vitals   09/13/20 2003 09/13/20 2006 09/14/20 0602 09/14/20 0705  BP: 126/74  (!) 144/88   Pulse: 73  75   Resp: 14  14   Temp: 98 F (36.7 C)  97.7 F (36.5 C)   TempSrc: Oral  Oral   SpO2: 100%  100%   Weight:   (!) 142 kg   Height:      PainSc:  0-No pain  0-No pain    Intake/Output Summary (Last 24 hours) at 09/14/2020 1119 Last data filed at 09/14/2020 0850 Gross per 24 hour  Intake 954 ml  Output 2250 ml  Net -1296 ml   Filed Weights   09/12/20 0619 09/13/20 0500 09/14/20 0602  Weight: (!) 146.7 kg (!) 143.6 kg (!) 142 kg   Weight change: -1.6 kg  Intake/Output from previous day: 07/20 0701 - 07/21 0700 In: 714 [P.O.:714] Out: 1800 [Urine:1800] Intake/Output this shift: Total I/O In: 240 [P.O.:240] Out: 450 [Urine:450] Filed Weights    09/12/20 0619 09/13/20 0500 09/14/20 0602  Weight: (!) 146.7 kg (!) 143.6 kg (!) 142 kg   Examination: General exam: AAOx3, older than stated age, weak appearing. HEENT:Oral mucosa moist, Ear/Nose WNL grossly, dentition normal. Respiratory system: bilaterally diminished, no use of accessory muscle Cardiovascular system: S1 & S2 +, No JVD,. Gastrointestinal system: Abdomen soft, NT,ND, BS+ Nervous System:Alert, awake, moving extremities and grossly nonfocal Extremities: chronic appearing leg edema, distal peripheral pulses palpable.  Skin: No rashes,no icterus. MSK: Normal muscle bulk,tone, power     Data Reviewed: I have  personally reviewed following labs and imaging studies CBC: No results for input(s): WBC, NEUTROABS, HGB, HCT, MCV, PLT in the last 168 hours.  Basic Metabolic Panel: Recent Labs  Lab 09/11/20 1022 09/14/20 0656  NA 139 139  K 3.6 3.8  CL 109 103  CO2 25 25  GLUCOSE 177* 190*  BUN 44* 40*  CREATININE 1.43* 1.54*  CALCIUM 8.7* 9.2   GFR: Estimated Creatinine Clearance: 71.4 mL/min (A) (by C-G formula based on SCr of 1.54 mg/dL (H)). Liver Function Tests: No results for input(s): AST, ALT, ALKPHOS, BILITOT, PROT, ALBUMIN in the last 168 hours. No results for input(s): LIPASE, AMYLASE in the last 168 hours. No results for input(s): AMMONIA in the last 168 hours. Coagulation Profile: No results for input(s): INR, PROTIME in the last 168 hours. Cardiac Enzymes: No results for input(s): CKTOTAL, CKMB, CKMBINDEX, TROPONINI in the last 168 hours. BNP (last 3 results) No results for input(s): PROBNP in the last 8760 hours. HbA1C: No results for input(s): HGBA1C in the last 72 hours. CBG: Recent Labs  Lab 09/13/20 0711 09/13/20 1152 09/13/20 1627 09/13/20 2005 09/14/20 0732  GLUCAP 186* 227* 189* 219* 199*   Lipid Profile: No results for input(s): CHOL, HDL, LDLCALC, TRIG, CHOLHDL, LDLDIRECT in the last 72 hours. Thyroid Function Tests: No results for  input(s): TSH, T4TOTAL, FREET4, T3FREE, THYROIDAB in the last 72 hours. Anemia Panel: No results for input(s): VITAMINB12, FOLATE, FERRITIN, TIBC, IRON, RETICCTPCT in the last 72 hours. Sepsis Labs: No results for input(s): PROCALCITON, LATICACIDVEN in the last 168 hours.  Recent Results (from the past 240 hour(s))  SARS CORONAVIRUS 2 (TAT 6-24 HRS) Nasopharyngeal Nasopharyngeal Swab     Status: None   Collection Time: 09/05/20 11:54 AM   Specimen: Nasopharyngeal Swab  Result Value Ref Range Status   SARS Coronavirus 2 NEGATIVE NEGATIVE Final    Comment: (NOTE) SARS-CoV-2 target nucleic acids are NOT DETECTED.  The SARS-CoV-2 RNA is generally detectable in upper and lower respiratory specimens during the acute phase of infection. Negative results do not preclude SARS-CoV-2 infection, do not rule out co-infections with other pathogens, and should not be used as the sole basis for treatment or other patient management decisions. Negative results must be combined with clinical observations, patient history, and epidemiological information. The expected result is Negative.  Fact Sheet for Patients: SugarRoll.be  Fact Sheet for Healthcare Providers: https://www.woods-mathews.com/  This test is not yet approved or cleared by the [place] FDA and  has been authorized for detection and/or diagnosis of SARS-CoV-2 by FDA under an Emergency Use Authorization (EUA). This EUA will remain  in effect (meaning this test can be used) for the duration of the COVID-19 declaration under Se ction 564(b)(1) of the Act, 21 U.S.C. section 360bbb-3(b)(1), unless the authorization is terminated or revoked sooner.  Performed at [facility], [place] [address] [place], [place] [address]       Radiology Studies: No results found.   LOS: 23 days   Antonieta Pert, MD [facility]  09/14/2020, 11:19 AM

## 2020-09-14 NOTE — Plan of Care (Signed)
  Problem: Education: Goal: Knowledge of General Education information will improve Description: Including pain rating scale, medication(s)/side effects and non-pharmacologic comfort measures Outcome: Progressing   Problem: Clinical Measurements: Goal: Cardiovascular complication will be avoided Outcome: Progressing   Problem: Safety: Goal: Ability to remain free from injury will improve Outcome: Progressing   Problem: Education: Goal: Ability to demonstrate management of disease process will improve Outcome: Progressing Goal: Ability to verbalize understanding of medication therapies will improve Outcome: Progressing

## 2020-09-14 NOTE — TOC Progression Note (Addendum)
Transition of Care [facility]) - Progression Note    Patient Details  Name: [name] MRN: [id number] Date of Birth: [birth year]  Transition of [facility] CM/SW Contact  Mercadez Heitman, Juliann Pulse, RN Phone Number: 09/14/2020, 10:17 AM  Clinical Narrative:checking into additonal SNF facilities to accept-must participate with therapy consistently every 2 days with goal no more than 2 people asst. [place] unable to [place] with additonal SNF's currently [facility] rep Ebony Hail will f/u-await outcome-she is aware she will need to get auth if accepted.   1:28p-Referral faxed with confirmation to [facility] rep Suanne Marker to review.     Expected Discharge Plan: Skilled Nursing Facility Barriers to Discharge: Insurance Authorization  Expected Discharge Plan and Services Expected Discharge Plan: [place]   Discharge Planning Services: CM Consult Post Acute Care Choice: [place] arrangements for the past 2 months: [place] Expected Discharge Date: 09/08/20                                     Social Determinants of Health (SDOH) Interventions    Readmission Risk Interventions Readmission Risk Prevention Plan 01/12/2020  Transportation Screening Complete  Palliative Care Screening Not Applicable  Some recent data might be hidden

## 2020-09-15 DIAGNOSIS — I5033 Acute on chronic diastolic (congestive) heart failure: Secondary | ICD-10-CM | POA: Diagnosis not present

## 2020-09-15 LAB — GLUCOSE, CAPILLARY
Glucose-Capillary: 237 mg/dL — ABNORMAL HIGH (ref 70–99)
Glucose-Capillary: 190 mg/dL — ABNORMAL HIGH (ref 70–99)
Glucose-Capillary: 180 mg/dL — ABNORMAL HIGH (ref 70–99)
Glucose-Capillary: 204 mg/dL — ABNORMAL HIGH (ref 70–99)

## 2020-09-15 NOTE — Progress Notes (Signed)
Occupational Therapy Treatment Patient Details Name: [name] MRN: [id number] DOB: [birth year] Today's Date: 09/15/2020    History of present illness 60 year old male with chronic diastolic CHF, diabetes, morbid obesity, history of presented with chest pain shortness of breath swelling.  He was found to have acute on chronic diastolic CHF with anasarca, was placed on IV Lasix 60 and was admitted.  He also had AKI and creatinine improved with IV diuresis.  He developed colonic ileus that improved with conservative management.  He was seen by PT OT and has advised skilled nursing facility.  Patient fluid status improved with IV Lasix.We will continue with home chf meds including torsemide. Continue fluid restriction salt restriction.He is being discharged to skilled nursing facility pending bed available.   OT comments  Pt progressing towards OT goals, remains motivated to improve functional abilities. Guided pt in UE HEP with task modification to maximize benefits, as well as core strengthening activities bed level. Guided pt in rolling to maximize participation with ADLs at Max A x 1 though pt unable to maintain this position. Provided education and repositioning strategies to decrease edema in R UE and scrotum. Plan to progress EOB ADLs in next sessions. Continue to recommend SNF rehab at [place].    Follow Up Recommendations  SNF    Equipment Recommendations  Other (comment) (TBD)    Recommendations for Other Services      Precautions / Restrictions Precautions Precautions: Fall Precaution Comments: edema R > L Restrictions Weight Bearing Restrictions: No       Mobility Bed Mobility Overal bed mobility: Needs Assistance Bed Mobility: Rolling Rolling: Max assist         General bed mobility comments: Able to roll with Max A x 1 but unable to sustain sidelying roll without Max A    Transfers                 General transfer comment: unable to attempt with +1 assist     Balance                                           ADL either performed or assessed with clinical judgement   ADL Overall ADL's : Needs assistance/impaired     Grooming: Set up;Bed level;Wash/dry face                                 General ADL Comments: Session focused on strategies/activities to improve UE and core strength, body mechanics when rolling and positioning to comat edema.     Vision   Vision Assessment?: No apparent visual deficits   Perception     Praxis      Cognition Arousal/Alertness: Awake/alert Behavior During Therapy: WFL for tasks assessed/performed Overall Cognitive Status: Within Functional Limits for tasks assessed                                          Exercises Exercises: General Upper Extremity;Other exercises General Exercises - Upper Extremity Shoulder Flexion: AAROM;Self ROM;Right;5 reps Elbow Flexion: Strengthening;Right;10 reps;Theraband Theraband Level (Elbow Flexion): Level 2 (Red) Elbow Extension: Strengthening;Right;10 reps;Theraband Theraband Level (Elbow Extension): Level 2 (Red) Other Exercises Other Exercises: use of B bedrails to pull self forward to  maximize core strength x 3   Shoulder Instructions       General Comments HR WFL, educated/encouraged elevation of R UE to decrease swelling, as well as scrotum with linens used to elevate within pt tolerance.    Pertinent Vitals/ Pain       Pain Assessment: Faces Faces Pain Scale: Hurts a little bit Pain Location: R LE Pain Descriptors / Indicators: Sore Pain Intervention(s): Monitored during session;Limited activity within patient's tolerance  Home Living                                          Prior Functioning/Environment              Frequency  Min 2X/week        Progress Toward Goals  OT Goals(current goals can now be found in the care plan section)  Progress towards OT goals:  Progressing toward goals  Acute Rehab OT Goals Patient Stated Goal: be able to do things for myself again OT Goal Formulation: With patient Time For Goal Achievement: 09/20/20 Potential to Achieve Goals: Fair ADL Goals Pt Will Perform Grooming: with set-up;sitting Pt Will Perform Upper Body Bathing: with set-up;sitting Pt Will Perform Upper Body Dressing: sitting;with set-up Pt Will Perform Lower Body Dressing: with min assist;sit to/from stand Pt Will Transfer to Toilet: with min assist;bedside commode;stand pivot transfer Pt Will Perform Toileting - Clothing Manipulation and hygiene: with min assist;sit to/from stand  Plan Discharge plan remains appropriate    Co-evaluation                 AM-PAC OT "6 Clicks" Daily Activity     Outcome Measure   Help from another person eating meals?: A Little Help from another person taking care of personal grooming?: A Little Help from another person toileting, which includes using toliet, bedpan, or urinal?: Total Help from another person bathing (including washing, rinsing, drying)?: A Lot Help from another person to put on and taking off regular upper body clothing?: A Little Help from another person to put on and taking off regular lower body clothing?: Total 6 Click Score: 13    End of Session    OT Visit Diagnosis: Other abnormalities of gait and mobility (R26.89);Muscle weakness (generalized) (M62.81);Hemiplegia and hemiparesis;Pain Hemiplegia - Right/Left: Right Hemiplegia - dominant/non-dominant: Dominant Hemiplegia - caused by: Cerebral infarction Pain - Right/Left: Right Pain - part of body: Shoulder   Activity Tolerance Patient tolerated treatment well   Patient Left in bed;with call bell/phone within reach;with bed alarm set   Nurse Communication Mobility status        Time: 2751-7001 OT Time Calculation (min): 46 min  Charges: OT General Charges $OT Visit: 1 Visit OT Treatments $Self Care/Home  Management : 8-22 mins $Therapeutic Activity: 8-22 mins $Therapeutic Exercise: 8-22 mins  Malachy Chamber, OTR/L Acute Rehab Services Office: [phone]    Layla Maw 09/15/2020, 1:16 PM

## 2020-09-15 NOTE — Plan of Care (Signed)
  Problem: Activity: Goal: Risk for activity intolerance will decrease Outcome: Progressing   Problem: Education: Goal: Ability to demonstrate management of disease process will improve Outcome: Progressing Goal: Ability to verbalize understanding of medication therapies will improve Outcome: Progressing   Problem: Clinical Measurements: Goal: Will remain free from infection Outcome: Adequate for Discharge Goal: Cardiovascular complication will be avoided Outcome: Adequate for Discharge   Problem: Nutrition: Goal: Adequate nutrition will be maintained Outcome: Adequate for Discharge

## 2020-09-15 NOTE — Progress Notes (Signed)
PROGRESS NOTE    [name]  LHT:[id number] DOB: [birth year] DOA: 08/21/2020 PCP: [name], NP   Chief Complaint  Patient presents with   Chest Pain   Leg Swelling    Brief Narrative: 60 year old male with chronic diastolic CHF, diabetes, morbid obesity, history of presented with chest pain shortness of breath swelling.  He was found to have acute on chronic diastolic CHF with anasarca, was placed on IV Lasix 60 and was admitted.  He also had AKI and creatinine improved with IV diuresis.  He developed colonic ileus that improved with conservative management.  He was seen by PT OT and has advised skilled nursing facility. Patient fluid status improved with IV Lasix.We will continue with home chf meds including torsemide. Continue fluid restriction salt restriction.He is being discharged to skilled nursing facility pending bed available.  With ongoing persistent swelling his torsemide was increased to 40 mew twice daily having good diuresis and weight is significantly improving. He medically stable awaiting for skilled nursing facility.  Subjective: Seen this morning.  No new complaints upset about not getting regular food but after my counseling he calmed down. No acute events overnight.  Assessment & Plan:  Acute on chronic heart failure with preserved ejection fraction Anasarca with lower extremity swelling/scrotal edema Patient is clinically improved with IV Lasix - Now on torsemide 40 mg po since 7/16-dose increased due to uptrending weight, now having good urine output. Net IO Since Admission: -21,322 mL [09/15/20 0704] 2D echo was reviewed EF 65 to 70% G2 DD. He has chronic appearing leg edema and scrotal edema.  wt down at 311> 1315> 323> 316> 313> 308 lb.  Continue torsemide 40 twice daily can probably decrease to once a daily in am, repeat BMP in AM.    AKI on CKD stage IIIa creatinine improved and stable at 1.4-1.5 as below. Bmp in am. Recent Labs  Lab 09/11/20 1022  09/14/20 0656  BUN 44* 40*  CREATININE 1.43* 1.54*   Ileus CT abdomen pelvis 7/7 no evidence of bowel obstruction or segment volvulus managed conservatively .  Tolerating diet  RUE swelling negative for DVT on duplex  Recent diarrhea resolved GI panel C. difficile negative.  Stroke history with right-sided weakness .  Stable, continues aspirin and Lipitor Pericardial effusion is stable with no signs of cardiac tamponade Depression-mood stable BPH-stable  Type 2 diabetes, A1c poorly controled 9.1.  Now slowly uptrending increase Lantus 5> 7 units, cont ss Recent Labs  Lab 09/13/20 2005 09/14/20 0732 09/14/20 1155 09/14/20 1740 09/14/20 2032  GLUCAP 219* 199* 202* 176* 240*   Anemia of chronic disease-stable.  Debility/physical deconditioning:Continue PT OT.He is waiting for placement at this time.     Morbid obesity BMI 47 will benefit weight loss, outpatient PCP follow-up  Essential hypertension: BP is well controlled on amlodipine Coreg Imdur.  Diet Order             Diet Heart Room service appropriate? Yes; Fluid consistency: Thin; Fluid restriction: 1500 mL Fluid  Diet effective now           Diet - low sodium heart healthy                 Patient's Body mass index is 46.96 kg/m.  DVT prophylaxis: enoxaparin (LOVENOX) injection 40 mg Start: 08/22/20 1000 Code Status:   Code Status: Full Code  Family Communication: plan of care discussed with patient at bedside. Status is: Inpatient Remains inpatient appropriate because:Inpatient level of care  appropriate due to severity of illness Dispo: The patient is from: ALF              Anticipated d/c is to: SNF once authorization received              Patient currently is medically stable to d/c.   Difficult to place patient No  Unresulted Labs (From admission, onward)    None       Medications reviewed:  Scheduled Meds:  amLODipine  10 mg Oral Daily   aspirin  81 mg Oral Daily   atorvastatin  80 mg Oral  QHS   carvedilol  12.5 mg Oral BID WC   enoxaparin (LOVENOX) injection  40 mg Subcutaneous Q24H   insulin aspart  0-9 Units Subcutaneous TID WC   insulin glargine  5 Units Subcutaneous QHS   isosorbide-hydrALAZINE  1 tablet Oral TID   pantoprazole  40 mg Oral Daily   polyethylene glycol  17 g Oral BID   potassium chloride  20 mEq Oral BID   pregabalin  75 mg Oral BID   saccharomyces boulardii  250 mg Oral BID   sodium chloride flush  3 mL Intravenous Q12H   tamsulosin  0.8 mg Oral Daily   torsemide  40 mg Oral BID   Continuous Infusions:  sodium chloride 250 mL (08/30/20 1403)   Consultants:see note  Procedures:see note Antimicrobials: Anti-infectives (From admission, onward)    None      Culture/Microbiology    Component Value Date/Time   SDES  03/08/2020 2201    BLOOD LEFT FOREARM Performed at [facility], [place] [address] [place], [place] [address]    [place]  03/08/2020 2201    BOTTLES DRAWN AEROBIC AND ANAEROBIC Blood Culture adequate volume Performed at [facility] [address], [place], [place] [address]    CULT  03/08/2020 2201    NO GROWTH 5 DAYS Performed at [facility] [address], [place],  [address]    REPTSTATUS 03/13/2020 FINAL 03/08/2020 2201    Other culture-see note  Objective: Vitals: Today's Vitals   09/14/20 1202 09/14/20 2034 09/14/20 2334 09/15/20 0406  BP: 123/84 135/81  140/82  Pulse: 73 73  75  Resp: 16 14  16   Temp: 97.8 F (36.6 C) 98.2 F (36.8 C)  98.5 F (36.9 C)  TempSrc:  Oral  Oral  SpO2: 100% 100%  100%  Weight:    (!) 140.1 kg  Height:      PainSc:   0-No pain     Intake/Output Summary (Last 24 hours) at 09/15/2020 0702 Last data filed at 09/15/2020 0516 Gross per 24 hour  Intake 776 ml  Output 2900 ml  Net -2124 ml    Filed Weights   09/13/20 0500 09/14/20 0602 09/15/20 0406  Weight: (!) 143.6 kg (!) 142 kg (!) 140.1 kg   Weight change: -1.9 kg   Intake/Output from previous day: 07/21 0701 - 07/22 0700 In: 776 [P.O.:776] Out: 2900 [Urine:2900] Intake/Output this shift: No intake/output data recorded. Filed Weights   09/13/20 0500 09/14/20 0602 09/15/20 0406  Weight: (!) 143.6 kg (!) 142 kg (!) 140.1 kg   Examination: General exam: AAOx 3,obese, older than stated age, weak appearing. HEENT:Oral mucosa moist, Ear/Nose WNL grossly, dentition normal. Respiratory system: bilaterally diminished, , no use of accessory muscle Cardiovascular system: S1 & S2 +, No JVD,. Gastrointestinal system: Abdomen soft, NT,ND, BS+ Nervous System:Alert, awake, moving extremities and grossly nonfocal Extremities:  Lower leg edema chronic appearing with hyperpigmentation and hyperkeratosis, scrotal edema Skin: No rashes,no icterus. MSK: Normal muscle bulk,tone, power    Data Reviewed: I have personally reviewed following labs and imaging studies CBC: No results for input(s): WBC, NEUTROABS, HGB, HCT, MCV, PLT in the last 168 hours.  Basic Metabolic Panel: Recent Labs  Lab 09/11/20 1022 09/14/20 0656  NA 139 139  K 3.6 3.8  CL 109 103  CO2 25 25  GLUCOSE 177* 190*  BUN 44* 40*  CREATININE 1.43* 1.54*  CALCIUM 8.7* 9.2    GFR: Estimated Creatinine Clearance: 70.9 mL/min (A) (by C-G formula based on SCr of 1.54 mg/dL (H)). Liver Function Tests: No results for input(s): AST, ALT, ALKPHOS, BILITOT, PROT, ALBUMIN in the last 168 hours. No results for input(s): LIPASE, AMYLASE in the last 168 hours. No results for input(s): AMMONIA in the last 168 hours. Coagulation Profile: No results for input(s): INR, PROTIME in the last 168 hours. Cardiac Enzymes: No results for input(s): CKTOTAL, CKMB, CKMBINDEX, TROPONINI in the last 168 hours. BNP (last 3 results) No results for input(s): PROBNP in the last 8760 hours. HbA1C: No results for input(s): HGBA1C in the last 72 hours. CBG: Recent Labs  Lab 09/13/20 2005 09/14/20 0732  09/14/20 1155 09/14/20 1740 09/14/20 2032  GLUCAP 219* 199* 202* 176* 240*    Lipid Profile: No results for input(s): CHOL, HDL, LDLCALC, TRIG, CHOLHDL, LDLDIRECT in the last 72 hours. Thyroid Function Tests: No results for input(s): TSH, T4TOTAL, FREET4, T3FREE, THYROIDAB in the last 72 hours. Anemia Panel: No results for input(s): VITAMINB12, FOLATE, FERRITIN, TIBC, IRON, RETICCTPCT in the last 72 hours. Sepsis Labs: No results for input(s): PROCALCITON, LATICACIDVEN in the last 168 hours.  Recent Results (from the past 240 hour(s))  SARS CORONAVIRUS 2 (TAT 6-24 HRS) Nasopharyngeal Nasopharyngeal Swab     Status: None   Collection Time: 09/05/20 11:54 AM   Specimen: Nasopharyngeal Swab  Result Value Ref Range Status   SARS Coronavirus 2 NEGATIVE NEGATIVE Final    Comment: (NOTE) SARS-CoV-2 target nucleic acids are NOT DETECTED.  The SARS-CoV-2 RNA is generally detectable in upper and lower respiratory specimens during the acute phase of infection. Negative results do not preclude SARS-CoV-2 infection, do not rule out co-infections with other pathogens, and should not be used as the sole basis for treatment or other patient management decisions. Negative results must be combined with clinical observations, patient history, and epidemiological information. The expected result is Negative.  Fact Sheet for Patients: SugarRoll.be  Fact Sheet for Healthcare Providers: https://www.woods-mathews.com/  This test is not yet approved or cleared by the [place] FDA and  has been authorized for detection and/or diagnosis of SARS-CoV-2 by FDA under an Emergency Use Authorization (EUA). This EUA will remain  in effect (meaning this test can be used) for the duration of the COVID-19 declaration under Se ction 564(b)(1) of the Act, 21 U.S.C. section 360bbb-3(b)(1), unless the authorization is terminated or revoked sooner.  Performed at  [facility], [place] [address], [place], [place] [address]       Radiology Studies: No results found.   LOS: 24 days   Antonieta Pert, MD [facility]  09/15/2020, 7:02 AM

## 2020-09-16 DIAGNOSIS — I5033 Acute on chronic diastolic (congestive) heart failure: Secondary | ICD-10-CM | POA: Diagnosis not present

## 2020-09-16 LAB — BASIC METABOLIC PANEL
Anion gap: 9 (ref 5–15)
BUN: 45 mg/dL — ABNORMAL HIGH (ref 6–20)
CO2: 25 mmol/L (ref 22–32)
Calcium: 8.9 mg/dL (ref 8.9–10.3)
Chloride: 105 mmol/L (ref 98–111)
Creatinine, Ser: 1.54 mg/dL — ABNORMAL HIGH (ref 0.61–1.24)
GFR, Estimated: 52 mL/min — ABNORMAL LOW (ref >60)
Glucose, Bld: 169 mg/dL — ABNORMAL HIGH (ref 70–99)
Potassium: 3.4 mmol/L — ABNORMAL LOW (ref 3.5–5.1)
Sodium: 139 mmol/L (ref 135–145)

## 2020-09-16 LAB — GLUCOSE, CAPILLARY
Glucose-Capillary: 164 mg/dL — ABNORMAL HIGH (ref 70–99)
Glucose-Capillary: 189 mg/dL — ABNORMAL HIGH (ref 70–99)
Glucose-Capillary: 123 mg/dL — ABNORMAL HIGH (ref 70–99)
Glucose-Capillary: 128 mg/dL — ABNORMAL HIGH (ref 70–99)

## 2020-09-16 MED ORDER — EMPAGLIFLOZIN 10 MG PO TABS
10.0000 mg | ORAL_TABLET | Freq: Every day | ORAL | Status: DC
  Administered 2020-09-17 – 2020-09-22 (×6): 10 mg via ORAL
  Filled 2020-09-16 (×6): qty 1

## 2020-09-16 MED ORDER — POLYETHYLENE GLYCOL 3350 17 G PO PACK: 17.0000 g | PACK | Freq: Every day | ORAL | Status: DC | PRN

## 2020-09-16 MED ORDER — INSULIN GLARGINE 100 UNIT/ML ~~LOC~~ SOLN
7.0000 [IU] | Freq: Every day | SUBCUTANEOUS | Status: DC
  Administered 2020-09-16 – 2020-09-17 (×2): 7 [IU] via SUBCUTANEOUS
  Filled 2020-09-16 (×2): qty 0.07

## 2020-09-16 NOTE — Progress Notes (Signed)
PROGRESS NOTE    [name]  WRU:[id number] DOB: [birth year] DOA: 08/21/2020 PCP: [name], NP   Chief Complaint  Patient presents with   Chest Pain   Leg Swelling    Brief Narrative: 60 year old male with chronic diastolic CHF, diabetes, morbid obesity, history of presented with chest pain shortness of breath swelling.  He was found to have acute on chronic diastolic CHF with anasarca, was placed on IV Lasix 60 and was admitted.  He also had AKI and creatinine improved with IV diuresis.  He developed colonic ileus that improved with conservative management.  He was seen by PT OT who have advised skilled nursing facility.  His fluid status improved with IV Lasix.We will continue with home chf meds including torsemide. Continue fluid restriction salt restriction.He is being discharged to skilled nursing facility pending bed available.  With ongoing persistent swelling his torsemide was increased to 40 mg twice daily having good diuresis and weight is significantly improving. He is medically stable awaiting for skilled nursing facility.  Subjective: No acute events overnight. He reported orthopnea, has not been doing any ambulating, states he is unsure if his swelling has significantly improved. Complains of having too many bowel movements and too many medications.  Assessment & Plan: #Acute on chronic heart failure with preserved ejection fraction #Anasarca with lower extremity swelling/scrotal edema - regimen: ISDN-hydral, coreg 12.5, Entresto - Initially treated with IV lasix, now weight down approx 20+ lbs - c/w torsemide 40mg  PO BID given orthopnea - check BNP in AM - start jardiance for further fluid management - currently holding Entresto 2/2 AKI, can resume on discharge  AKI on CKD stage IIIa Cardiorenal syndrome  - Initial Cr 2.0, baseline 1.3-1.6 prior - has overall improved with diuresis - Cr stable at 1.54, CTM  Ileus  - KUB ON 7/5 showing ileus, patient has  responded to bowel regimen - CT abdomen pelvis 7/7 no evidence of bowel obstruction or segment volvulus managed conservatively.   - Tolerating diet  RUE swelling negative for DVT on duplex  Recent diarrhea GI panel and C. difficile negative. Suspect medication related. CTM  Chronic conditions: Hx of CAD Hx of OM1 subtotal occlusion per [facility] 06/2019 - c/w asa, statin  Hx of Stroke history with right-sided weakness .  Stable, continues aspirin and Lipitor  Pericardial effusion is stable with no signs of cardiac tamponade  Depression-mood stable, c/w sertraline  BPH-c/w tamsulosin  Type 2 diabetes c/b peripheral neuropathy A1c poorly controled 9.1.   - home regimen: insulin glargine 30u qhs - c/w insulin 7u qhs; significant disparity in dosing suggesting very different eating habits; will likely need ongoing outpatient titration post discharge - c/w pregabalin  Recent Labs  Lab 09/15/20 1137 09/15/20 1719 09/15/20 2050 09/16/20 0724 09/16/20 1116  GLUCAP 190* 180* 204* 164* 189*   Anemia of chronic disease-trend CBC periodically  Debility/physical deconditioning: -Continue PT OT. Prior auth pending for placement   Morbid obesity BMI 47  - while there is significant fluid contribution, will benefit from weight loss, outpatient PCP follow-up  Essential hypertension: BP is moderately well controlled  - home regimen: amlodipine, Coreg, Imdur.  Diet Order             Diet Heart Room service appropriate? Yes; Fluid consistency: Thin; Fluid restriction: 1500 mL Fluid  Diet effective now           Diet - low sodium heart healthy  Patient's Body mass index is 46.96 kg/m.  DVT prophylaxis: enoxaparin (LOVENOX) injection 40 mg Start: 08/22/20 1000 Code Status:   Code Status: Full Code  Family Communication: plan of care discussed with patient at bedside. Status is: Inpatient Remains inpatient appropriate because:Inpatient level of care appropriate due  to severity of illness Dispo: The patient is from: ALF              Anticipated d/c is to: SNF once authorization received              Patient currently is medically stable to d/c.   Difficult to place patient No  Unresulted Labs (From admission, onward)    None       Medications reviewed:  Scheduled Meds:  amLODipine  10 mg Oral Daily   aspirin  81 mg Oral Daily   atorvastatin  80 mg Oral QHS   carvedilol  12.5 mg Oral BID WC   enoxaparin (LOVENOX) injection  40 mg Subcutaneous Q24H   insulin aspart  0-9 Units Subcutaneous TID WC   insulin glargine  5 Units Subcutaneous QHS   isosorbide-hydrALAZINE  1 tablet Oral TID   pantoprazole  40 mg Oral Daily   polyethylene glycol  17 g Oral BID   potassium chloride  20 mEq Oral BID   pregabalin  75 mg Oral BID   saccharomyces boulardii  250 mg Oral BID   sodium chloride flush  3 mL Intravenous Q12H   tamsulosin  0.8 mg Oral Daily   torsemide  40 mg Oral BID   Continuous Infusions:  sodium chloride 250 mL (08/30/20 1403)   Consultants:see note  Procedures:see note Antimicrobials: Anti-infectives (From admission, onward)    None      Culture/Microbiology    Component Value Date/Time   SDES  03/08/2020 2201    BLOOD LEFT FOREARM Performed at [facility], [place] [address], [place], [place] [address]    [place]  03/08/2020 2201    BOTTLES DRAWN AEROBIC AND ANAEROBIC Blood Culture adequate volume Performed at [facility] [address] [place], [place] [address]    CULT  03/08/2020 2201    NO GROWTH 5 DAYS Performed at [facility] [address], [place], [place] [address]    REPTSTATUS 03/13/2020 FINAL 03/08/2020 2201    Other culture-see note  Objective: Vitals: Today's Vitals   09/15/20 2000 09/15/20 2053 09/16/20 1059 09/16/20 1430  BP:  138/82 (!) 147/83 (!) 141/84  Pulse:  76 78 77  Resp:  14  18  Temp:  98.6 F (37 C)  98.1 F (36.7 C)  TempSrc:  Oral  Oral   SpO2:  100%  100%  Weight:      Height:      PainSc: 2        Intake/Output Summary (Last 24 hours) at 09/16/2020 1449 Last data filed at 09/16/2020 0914 Gross per 24 hour  Intake 360 ml  Output 2550 ml  Net -2190 ml    Filed Weights   09/13/20 0500 09/14/20 0602 09/15/20 0406  Weight: (!) 143.6 kg (!) 142 kg (!) 140.1 kg   Weight change:   Intake/Output from previous day: 07/22 0701 - 07/23 0700 In: 720 [P.O.:720] Out: 1100 [Urine:1100] Intake/Output this shift: Total I/O In: 120 [P.O.:120] Out: 1450 [Urine:1450] Filed Weights   09/13/20 0500 09/14/20 0602 09/15/20 0406  Weight: (!) 143.6 kg (!) 142 kg (!) 140.1 kg   Examination: General exam: AAOx 3,obese,  older than stated age, weak appearing. HEENT: Oral mucosa moist, Ear/Nose WNL grossly, dentition normal. Respiratory system: bilaterally diminished, , no use of accessory muscle Cardiovascular system: S1 & S2 +, +crescendo-decrescendo murmur Gastrointestinal system: Abdomen soft, NT,ND, BS+ Nervous System: Alert, awake, moving extremities and grossly nonfocal Extremities: 3+ Lower leg edema chronic appearing with hyperpigmentation and hyperkeratosis, scrotal edema Skin: No rashes,no icterus. MSK: Normal muscle bulk/tone     Data Reviewed: I have personally reviewed following labs and imaging studies CBC: No results for input(s): WBC, NEUTROABS, HGB, HCT, MCV, PLT in the last 168 hours.  Basic Metabolic Panel: Recent Labs  Lab 09/11/20 1022 09/14/20 0656 09/16/20 0843  NA 139 139 139  K 3.6 3.8 3.4*  CL 109 103 105  CO2 25 25 25   GLUCOSE 177* 190* 169*  BUN 44* 40* 45*  CREATININE 1.43* 1.54* 1.54*  CALCIUM 8.7* 9.2 8.9    GFR: Estimated Creatinine Clearance: 70.9 mL/min (A) (by C-G formula based on SCr of 1.54 mg/dL (H)). Liver Function Tests: No results for input(s): AST, ALT, ALKPHOS, BILITOT, PROT, ALBUMIN in the last 168 hours. No results for input(s): LIPASE, AMYLASE in the last 168  hours. No results for input(s): AMMONIA in the last 168 hours. Coagulation Profile: No results for input(s): INR, PROTIME in the last 168 hours. Cardiac Enzymes: No results for input(s): CKTOTAL, CKMB, CKMBINDEX, TROPONINI in the last 168 hours. BNP (last 3 results) No results for input(s): PROBNP in the last 8760 hours. HbA1C: No results for input(s): HGBA1C in the last 72 hours. CBG: Recent Labs  Lab 09/15/20 1137 09/15/20 1719 09/15/20 2050 09/16/20 0724 09/16/20 1116  GLUCAP 190* 180* 204* 164* 189*    Lipid Profile: No results for input(s): CHOL, HDL, LDLCALC, TRIG, CHOLHDL, LDLDIRECT in the last 72 hours. Thyroid Function Tests: No results for input(s): TSH, T4TOTAL, FREET4, T3FREE, THYROIDAB in the last 72 hours. Anemia Panel: No results for input(s): VITAMINB12, FOLATE, FERRITIN, TIBC, IRON, RETICCTPCT in the last 72 hours. Sepsis Labs: No results for input(s): PROCALCITON, LATICACIDVEN in the last 168 hours.  No results found for this or any previous visit (from the past 240 hour(s)).     Radiology Studies: No results found.   LOS: 25 days   Cecille Rubin, MD [facility]  09/16/2020, 2:49 PM

## 2020-09-16 NOTE — TOC Progression Note (Addendum)
Transition of Care [facility]) - Progression Note    Patient Details  Name: [name] MRN: [id number] Date of Birth: 12/10/1960  [facility] CM/SW Contact  Ross Ludwig, [place] Phone Number: 09/16/2020, 10:51 AM  Clinical Narrative:     CSW called Ebony Hail at [facility], she is still reviewing patient, and will let CSW know if they can accept pending insurance authorization.  CSW also sent updated clinical information to different facilities.   Expected Discharge Plan: Skilled Nursing Facility Barriers to Discharge: Insurance Authorization  Expected Discharge Plan and Services Expected Discharge Plan: [place]   Discharge Planning Services: CM Consult Post Acute Care Choice: [place] arrangements for the past 2 months: [place] Expected Discharge Date: 09/08/20                                     Social Determinants of Health (SDOH) Interventions    Readmission Risk Interventions Readmission Risk Prevention Plan 01/12/2020  Transportation Screening Complete  Palliative Care Screening Not Applicable  Some recent data might be hidden

## 2020-09-17 DIAGNOSIS — I5033 Acute on chronic diastolic (congestive) heart failure: Secondary | ICD-10-CM | POA: Diagnosis not present

## 2020-09-17 LAB — GLUCOSE, CAPILLARY
Glucose-Capillary: 137 mg/dL — ABNORMAL HIGH (ref 70–99)
Glucose-Capillary: 155 mg/dL — ABNORMAL HIGH (ref 70–99)
Glucose-Capillary: 174 mg/dL — ABNORMAL HIGH (ref 70–99)
Glucose-Capillary: 217 mg/dL — ABNORMAL HIGH (ref 70–99)

## 2020-09-17 MED ORDER — FUROSEMIDE 10 MG/ML IJ SOLN
80.0000 mg | Freq: Once | INTRAMUSCULAR | Status: AC
  Administered 2020-09-17: 80 mg via INTRAVENOUS
  Filled 2020-09-17: qty 8

## 2020-09-17 MED ORDER — FERROUS SULFATE 325 (65 FE) MG PO TABS
325.0000 mg | ORAL_TABLET | Freq: Every day | ORAL | Status: DC
  Administered 2020-09-18 – 2020-09-22 (×5): 325 mg via ORAL
  Filled 2020-09-17 (×5): qty 1

## 2020-09-17 NOTE — TOC Progression Note (Signed)
Transition of Care [facility]) - Progression Note    Patient Details  Name: [name] MRN: [id number] Date of Birth: 1960-10-23  Transition of Care [facility]) CM/SW Contact  Cecil Cobbs Phone Number: 09/17/2020, 4:54 PM  Clinical Narrative:     If patient agreeable [facility] in [place] can accept patient, they will have to restart insurance authorization though.  TOC to follow up with patient with bed offers.   Expected Discharge Plan: Skilled Nursing Facility Barriers to Discharge: Insurance Authorization  Expected Discharge Plan and Services Expected Discharge Plan: [place]   Discharge Planning Services: CM Consult Post Acute Care Choice: [place] arrangements for the past 2 months: [place] Expected Discharge Date: 09/08/20                                     Social Determinants of Health (SDOH) Interventions    Readmission Risk Interventions Readmission Risk Prevention Plan 01/12/2020  Transportation Screening Complete  Palliative Care Screening Not Applicable  Some recent data might be hidden

## 2020-09-17 NOTE — Progress Notes (Signed)
PROGRESS NOTE    [name]  KDT:[id number] DOB: [birth year] DOA: 08/21/2020 PCP: [name], NP   Chief Complaint  Patient presents with   Chest Pain   Leg Swelling    Brief Narrative: 60 year old male with chronic diastolic CHF, diabetes, morbid obesity, history of presented with chest pain shortness of breath swelling.  He was found to have acute on chronic diastolic CHF with anasarca, was placed on IV Lasix 60 and was admitted.  He also had AKI and creatinine improved with IV diuresis.  He developed colonic ileus that improved with conservative management.  He was seen by PT OT who have advised skilled nursing facility.  His fluid status improved with IV Lasix.We will continue with home chf meds including torsemide. Continue fluid restriction salt restriction.He is being discharged to skilled nursing facility pending bed available.  With ongoing persistent swelling his torsemide was increased to 40 mg twice daily having good diuresis and weight is significantly improving. He is medically stable awaiting for skilled nursing facility.  Subjective: No acute events overnight. Continues reporting orthopnea, refused vitals and labs overnight. Seen napping.  Assessment & Plan: #Acute on chronic heart failure with preserved ejection fraction #Anasarca with lower extremity swelling/scrotal edema - regimen: ISDN-hydral, coreg 12.5, Entresto - Initially treated with IV lasix, now weight down approx 20+ lbs - c/w torsemide 40mg  PO BID given orthopnea - check BNP in AM - start jardiance for further fluid management - currently holding Entresto 2/2 AKI, can resume on discharge - strict I/O  AKI on CKD stage IIIa Cardiorenal syndrome  - Initial Cr 2.0, baseline 1.3-1.6 prior - has overall improved with diuresis - Cr stable at 1.54, CTM Recent Labs  Lab 09/11/20 1022 09/14/20 0656 09/16/20 0843  BUN 44* 40* 45*  CREATININE 1.43* 1.54* 1.54*   Ileus  - KUB ON 7/5 showing  ileus, patient has responded to bowel regimen - CT abdomen pelvis 7/7 no evidence of bowel obstruction or segment volvulus managed conservatively.   - Tolerating diet, making BMs  RUE swelling negative for DVT on duplex  Recent diarrhea GI panel and C. difficile negative. Suspect medication related. CTM  Chronic conditions: Hx of CAD Hx of OM1 subtotal occlusion per [facility] 06/2019 - c/w asa, statin  Hx of Stroke history with right-sided weakness .  Stable, continues aspirin and Lipitor  Pericardial effusion is stable with no signs of cardiac tamponade  Depression-mood stable, c/w sertraline  BPH-c/w tamsulosin  Type 2 diabetes c/b peripheral neuropathy A1c poorly controled 9.1.   - home regimen: insulin glargine 30u qhs - c/w insulin 7u qhs; significant disparity in dosing suggesting very different eating habits; will likely need ongoing outpatient titration post discharge - c/w pregabalin - c/w empagliflozin  Recent Labs  Lab 09/16/20 1116 09/16/20 1628 09/16/20 2012 09/17/20 0744 09/17/20 1127  GLUCAP 189* 123* 128* 137* 155*    Anemia of chronic disease Iron deficiency anemia - Ferritin <100 which is suspicious for iron deficiency component - trend CBC periodically - FeSO4 PO  Debility/physical deconditioning: -Continue PT OT. Prior auth pending for placement   Morbid obesity BMI 47  - while there is significant fluid contribution, will benefit from weight loss, outpatient PCP follow-up  Essential hypertension: BP is moderately well controlled  - home regimen: amlodipine, Coreg, Imdur.  Diet Order             Diet Heart Room service appropriate? Yes; Fluid consistency: Thin; Fluid restriction: 2000 mL Fluid  Diet effective  now           Diet - low sodium heart healthy                 Patient's Body mass index is 46.96 kg/m.  DVT prophylaxis: enoxaparin (LOVENOX) injection 40 mg Start: 08/22/20 1000 Code Status:   Code Status: Full Code  Family  Communication: plan of care discussed with patient at bedside. Status is: Inpatient Remains inpatient appropriate because:Inpatient level of care appropriate due to severity of illness Dispo: The patient is from: ALF              Anticipated d/c is to: SNF once authorization received              Patient currently is medically stable to d/c.   Difficult to place patient No  Unresulted Labs (From admission, onward)     Start     Ordered   09/18/20 4034  Basic metabolic panel  Tomorrow morning,   R       Question:  Specimen collection method  Answer:  Lab=Lab collect   09/17/20 0729            Medications reviewed:  Scheduled Meds:  amLODipine  10 mg Oral Daily   aspirin  81 mg Oral Daily   atorvastatin  80 mg Oral QHS   carvedilol  12.5 mg Oral BID WC   empagliflozin  10 mg Oral Daily   enoxaparin (LOVENOX) injection  40 mg Subcutaneous Q24H   insulin aspart  0-9 Units Subcutaneous TID WC   insulin glargine  7 Units Subcutaneous QHS   isosorbide-hydrALAZINE  1 tablet Oral TID   pantoprazole  40 mg Oral Daily   potassium chloride  20 mEq Oral BID   pregabalin  75 mg Oral BID   sodium chloride flush  3 mL Intravenous Q12H   tamsulosin  0.8 mg Oral Daily   torsemide  40 mg Oral BID   Continuous Infusions:  sodium chloride 250 mL (08/30/20 1403)   Consultants:see note  Procedures:see note Antimicrobials: Anti-infectives (From admission, onward)    None      Culture/Microbiology    Component Value Date/Time   SDES  03/08/2020 2201    BLOOD LEFT FOREARM Performed at [facility], [address] [place], [place] [address]    [place]  03/08/2020 2201    BOTTLES DRAWN AEROBIC AND ANAEROBIC Blood Culture adequate volume Performed at [facility] [address] [place], [place] [address]    CULT  03/08/2020 2201    NO GROWTH 5 DAYS Performed at [facility] [address] [place], [place] [address]    REPTSTATUS 03/13/2020  FINAL 03/08/2020 2201    Other culture-see note  Objective: Vitals: Today's Vitals   09/16/20 2000 09/16/20 2015 09/17/20 0833 09/17/20 1100  BP:  129/81 (!) 138/93   Pulse:  76 75   Resp:  10 16   Temp:  98.4 F (36.9 C) 97.7 F (36.5 C)   TempSrc:  Oral Oral   SpO2:  99% 100%   Weight:      Height:      PainSc: 0-No pain   0-No pain    Intake/Output Summary (Last 24 hours) at 09/17/2020 1525 Last data filed at 09/17/2020 0820 Gross per 24 hour  Intake 1000 ml  Output 425 ml  Net 575 ml    Filed Weights   09/13/20 0500 09/14/20 0602 09/15/20 0406  Weight: Marland Kitchen)  143.6 kg (!) 142 kg (!) 140.1 kg   Weight change:   Intake/Output from previous day: 07/23 0701 - 07/24 0700 In: 1360 [P.O.:1360] Out: 2050 [Urine:2050] Intake/Output this shift: Total I/O In: -  Out: 425 [Urine:425] Filed Weights   09/13/20 0500 09/14/20 0602 09/15/20 0406  Weight: (!) 143.6 kg (!) 142 kg (!) 140.1 kg   Examination: General exam: AAOx 3, obese, older than stated age, weak appearing. HEENT: Oral mucosa moist, Ear/Nose WNL grossly, dentition normal. Respiratory system: bilaterally diminished, , no use of accessory muscle Cardiovascular system: S1 & S2 +, +crescendo-decrescendo murmur Gastrointestinal system: Abdomen soft, NT,ND, BS+ Nervous System: Alert, awake, moving extremities and grossly nonfocal Extremities: 3+ Lower leg edema chronic appearing with hyperpigmentation and hyperkeratosis, scrotal edema, RUE edema Skin: No rashes,no icterus. MSK: Normal muscle bulk/tone     Data Reviewed: I have personally reviewed following labs and imaging studies CBC: No results for input(s): WBC, NEUTROABS, HGB, HCT, MCV, PLT in the last 168 hours.  Basic Metabolic Panel: Recent Labs  Lab 09/11/20 1022 09/14/20 0656 09/16/20 0843  NA 139 139 139  K 3.6 3.8 3.4*  CL 109 103 105  CO2 25 25 25   GLUCOSE 177* 190* 169*  BUN 44* 40* 45*  CREATININE 1.43* 1.54* 1.54*  CALCIUM 8.7* 9.2 8.9     GFR: Estimated Creatinine Clearance: 70.9 mL/min (A) (by C-G formula based on SCr of 1.54 mg/dL (H)). Liver Function Tests: No results for input(s): AST, ALT, ALKPHOS, BILITOT, PROT, ALBUMIN in the last 168 hours. No results for input(s): LIPASE, AMYLASE in the last 168 hours. No results for input(s): AMMONIA in the last 168 hours. Coagulation Profile: No results for input(s): INR, PROTIME in the last 168 hours. Cardiac Enzymes: No results for input(s): CKTOTAL, CKMB, CKMBINDEX, TROPONINI in the last 168 hours. BNP (last 3 results) No results for input(s): PROBNP in the last 8760 hours. HbA1C: No results for input(s): HGBA1C in the last 72 hours. CBG: Recent Labs  Lab 09/16/20 1116 09/16/20 1628 09/16/20 2012 09/17/20 0744 09/17/20 1127  GLUCAP 189* 123* 128* 137* 155*    Lipid Profile: No results for input(s): CHOL, HDL, LDLCALC, TRIG, CHOLHDL, LDLDIRECT in the last 72 hours. Thyroid Function Tests: No results for input(s): TSH, T4TOTAL, FREET4, T3FREE, THYROIDAB in the last 72 hours. Anemia Panel: No results for input(s): VITAMINB12, FOLATE, FERRITIN, TIBC, IRON, RETICCTPCT in the last 72 hours. Sepsis Labs: No results for input(s): PROCALCITON, LATICACIDVEN in the last 168 hours.  No results found for this or any previous visit (from the past 240 hour(s)).     Radiology Studies: No results found.   LOS: 26 days   Cecille Rubin, MD [facility]  09/17/2020, 3:25 PM

## 2020-09-17 NOTE — Progress Notes (Signed)
Patient refused

## 2020-09-17 NOTE — Progress Notes (Signed)
End of Shift:   Patient refused morning labs and vital signs. Patient had two incontinent loose bowel movements over the night. No acute changes in mentation or vital signs.

## 2020-09-18 DIAGNOSIS — I5033 Acute on chronic diastolic (congestive) heart failure: Secondary | ICD-10-CM | POA: Diagnosis not present

## 2020-09-18 LAB — GLUCOSE, CAPILLARY
Glucose-Capillary: 170 mg/dL — ABNORMAL HIGH (ref 70–99)
Glucose-Capillary: 218 mg/dL — ABNORMAL HIGH (ref 70–99)
Glucose-Capillary: 205 mg/dL — ABNORMAL HIGH (ref 70–99)
Glucose-Capillary: 192 mg/dL — ABNORMAL HIGH (ref 70–99)

## 2020-09-18 LAB — BASIC METABOLIC PANEL
Anion gap: 8 (ref 5–15)
BUN: 46 mg/dL — ABNORMAL HIGH (ref 6–20)
CO2: 27 mmol/L (ref 22–32)
Calcium: 8.7 mg/dL — ABNORMAL LOW (ref 8.9–10.3)
Chloride: 103 mmol/L (ref 98–111)
Creatinine, Ser: 1.67 mg/dL — ABNORMAL HIGH (ref 0.61–1.24)
GFR, Estimated: 47 mL/min — ABNORMAL LOW (ref >60)
Glucose, Bld: 229 mg/dL — ABNORMAL HIGH (ref 70–99)
Potassium: 3.2 mmol/L — ABNORMAL LOW (ref 3.5–5.1)
Sodium: 138 mmol/L (ref 135–145)

## 2020-09-18 LAB — CBC
HCT: 28.5 % — ABNORMAL LOW (ref 39.0–52.0)
Hemoglobin: 8.9 g/dL — ABNORMAL LOW (ref 13.0–17.0)
MCH: 25.1 pg — ABNORMAL LOW (ref 26.0–34.0)
MCHC: 31.2 g/dL (ref 30.0–36.0)
MCV: 80.3 fL (ref 80.0–100.0)
Platelets: 186 10*3/uL (ref 150–400)
RBC: 3.55 MIL/uL — ABNORMAL LOW (ref 4.22–5.81)
RDW: 15.9 % — ABNORMAL HIGH (ref 11.5–15.5)
WBC: 3.6 10*3/uL — ABNORMAL LOW (ref 4.0–10.5)
nRBC: 0 % (ref 0.0–0.2)

## 2020-09-18 LAB — BRAIN NATRIURETIC PEPTIDE: B Natriuretic Peptide: 122.1 pg/mL — ABNORMAL HIGH (ref 0.0–100.0)

## 2020-09-18 MED ORDER — LOPERAMIDE HCL 2 MG PO CAPS
2.0000 mg | ORAL_CAPSULE | ORAL | Status: DC | PRN
  Administered 2020-09-21 – 2020-09-22 (×2): 2 mg via ORAL
  Filled 2020-09-18 (×2): qty 1

## 2020-09-18 MED ORDER — INSULIN GLARGINE 100 UNIT/ML ~~LOC~~ SOLN
10.0000 [IU] | Freq: Every day | SUBCUTANEOUS | Status: DC
  Administered 2020-09-18: 10 [IU] via SUBCUTANEOUS
  Filled 2020-09-18: qty 0.1

## 2020-09-18 MED ORDER — LOPERAMIDE HCL 2 MG PO CAPS
4.0000 mg | ORAL_CAPSULE | Freq: Once | ORAL | Status: AC
  Administered 2020-09-18: 4 mg via ORAL
  Filled 2020-09-18: qty 2

## 2020-09-18 NOTE — Progress Notes (Signed)
Physical Therapy Treatment Patient Details Name: [name] MRN: [id number] DOB: 08-22-1960 Today's Date: 09/18/2020    History of Present Illness 60 year old male with chronic diastolic CHF, diabetes, morbid obesity, history of presented with chest pain shortness of breath swelling.  He was found to have acute on chronic diastolic CHF with anasarca, was placed on IV Lasix 60 and was admitted.  He also had AKI and creatinine improved with IV diuresis.  He developed colonic ileus that improved with conservative management.  He was seen by PT OT and has advised skilled nursing facility.  Patient fluid status improved with IV Lasix.We will continue with home chf meds including torsemide. Continue fluid restriction salt restriction.He is being discharged to skilled nursing facility pending bed available.    PT Comments    Pt agreeable to working with PT. Worked on bed mobility and sitting balance-static and dynamic. Pt reports increased back pain in sitting on today. Assisted pt back to bed, at his request, at end of session. Continue to recommend SNF for continued rehab.     Follow Up Recommendations  SNF     Equipment Recommendations  None recommended by PT    Recommendations for Other Services       Precautions / Restrictions Precautions Precautions: Fall Precaution Comments: edematous LE, scrotum Restrictions Weight Bearing Restrictions: No    Mobility  Bed Mobility Overal bed mobility: Needs Assistance Bed Mobility: Rolling;Supine to Sit;Sit to Supine Rolling: Max assist   Supine to sit: Mod assist;+2 for physical assistance;+2 for safety/equipment;HOB elevated Sit to supine: Max assist;+2 for physical assistance;+2 for safety/equipment;HOB elevated   General bed mobility comments: Rolling to L and R sides with Max A. Assist for trunk and bil LEs. Utilized bedpad for scooting, positioning. Increased time.    Transfers                 General transfer comment: NT-  worked on sitting balance, weighshifting at [employer]  Ambulation/Gait                 Stairs             Wheelchair Mobility    Modified Rankin (Stroke Patients Only)       Balance Overall balance assessment: Needs assistance Sitting-balance support: Bilateral upper extremity supported;Feet supported Sitting balance-Leahy Scale: Poor Sitting balance - Comments: Heavy reliance on bil UEs for static sitting balance. Worked on weightshifting to L side and anteriorly-instead of propping on R elbow. Pt reported increased back pain on today.                                    Cognition Arousal/Alertness: Awake/alert Behavior During Therapy: WFL for tasks assessed/performed Overall Cognitive Status: Within Functional Limits for tasks assessed                                        Exercises      General Comments        Pertinent Vitals/Pain Pain Assessment: Faces Faces Pain Scale: Hurts whole lot Pain Location: low back Pain Descriptors / Indicators: Aching;Discomfort;Sore;Grimacing Pain Intervention(s): Limited activity within patient's tolerance;Monitored during session;Repositioned    Home Living                      Prior Function  PT Goals (current goals can now be found in the care plan section) Progress towards PT goals: Progressing toward goals    Frequency    Min 2X/week (may have to see more often per CM for insurance purposes)      PT Plan Current plan remains appropriate    Co-evaluation              AM-PAC PT "6 Clicks" Mobility   Outcome Measure  Help needed turning from your back to your side while in a flat bed without using bedrails?: Total Help needed moving from lying on your back to sitting on the side of a flat bed without using bedrails?: Total Help needed moving to and from a bed to a chair (including a wheelchair)?: Total Help needed standing up from a chair using your  arms (e.g., wheelchair or bedside chair)?: Total Help needed to walk in hospital room?: Total Help needed climbing 3-5 steps with a railing? : Total 6 Click Score: 6    End of Session   Activity Tolerance: Patient limited by fatigue;Patient limited by pain Patient left: in bed;with call bell/phone within reach;with bed alarm set   PT Visit Diagnosis: Muscle weakness (generalized) (M62.81);Other abnormalities of gait and mobility (R26.89);Pain     Time: 1425-1502 PT Time Calculation (min) (ACUTE ONLY): 37 min  Charges:  $Therapeutic Activity: 23-37 mins                        Doreatha Massed, PT Acute Rehabilitation  Office: [phone] Pager: [phone]

## 2020-09-18 NOTE — Progress Notes (Signed)
PROGRESS NOTE    [name]  KGM:[id number] DOB: 1960/05/16 DOA: 08/21/2020 PCP: [name], NP   Chief Complaint  Patient presents with   Chest Pain   Leg Swelling    Brief Narrative: 60 year old male with chronic diastolic CHF, diabetes, morbid obesity, history of presented with chest pain shortness of breath swelling.  He was found to have acute on chronic diastolic CHF with anasarca, was placed on IV Lasix 60 and was admitted.  He also had AKI and creatinine improved with IV diuresis.  He developed colonic ileus that improved with conservative management.  He was seen by PT OT who have advised skilled nursing facility.  His fluid status improved with IV Lasix.We will continue with home chf meds including torsemide. Continue fluid restriction salt restriction.He is being discharged to skilled nursing facility pending bed available.  With ongoing persistent swelling his torsemide was increased to 40 mg twice daily having good diuresis and weight is significantly improving. He is medically stable awaiting for skilled nursing facility.  Assessment & Plan: #Acute on chronic heart failure with preserved ejection fraction #Anasarca with lower extremity swelling/scrotal edema - Initially treated with IV lasix, - c/w torsemide 40mg  PO BID and rest of the medications. - currently holding Entresto 2/2 AKI, can resume on discharge - strict I/O  AKI on CKD stage IIIa Cardiorenal syndrome  - Initial Cr 2.0, baseline 1.3-1.6 prior - has overall improved with diuresis Creatinine now back to baseline. Recent Labs  Lab 09/11/20 1022 09/14/20 0656 09/16/20 0843  BUN 44* 40* 45*  CREATININE 1.43* 1.54* 1.54*   Ileus  - KUB ON 7/5 showing ileus, patient has responded to bowel regimen - CT abdomen pelvis 7/7 no evidence of bowel obstruction or segment volvulus managed conservatively and now resolved.  Diarrhea: Does not seem to be on any medications that can cause diarrhea.  No  signs of infection so doubt C. difficile.  He has already been tested negative recently for GI panel and C. difficile.  We will try Imodium.  RUE swelling negative for DVT on duplex  Chronic conditions: Hx of CAD Hx of OM1 subtotal occlusion per [facility] 06/2019 - c/w asa, statin  Hx of Stroke history with right-sided weakness .  Stable, continues aspirin and Lipitor  Pericardial effusion is stable with no signs of cardiac tamponade  Depression-mood stable, c/w sertraline  BPH-c/w tamsulosin  Type 2 diabetes c/b peripheral neuropathy A1c poorly controled 9.1.   - home regimen: insulin glargine 30u qhs Slightly hyperglycemic, increase Lantus tonight from 7units to 10 units and continue SSI. - c/w pregabalin - c/w empagliflozin  Recent Labs  Lab 09/17/20 0744 09/17/20 1127 09/17/20 1604 09/17/20 2108 09/18/20 0725  GLUCAP 137* 155* 174* 217* 170*    Anemia of chronic disease Iron deficiency anemia - Ferritin <100 which is suspicious for iron deficiency component - trend CBC periodically - FeSO4 PO  Debility/physical deconditioning: -Continue PT OT. Prior auth pending for placement   Morbid obesity BMI 47  - while there is significant fluid contribution, will benefit from weight loss, outpatient PCP follow-up  Essential hypertension: BP is moderately well controlled  - home regimen: amlodipine, Coreg, Imdur.  Diet Order             Diet Heart Room service appropriate? Yes; Fluid consistency: Thin; Fluid restriction: 2000 mL Fluid  Diet effective now           Diet - low sodium heart healthy  Patient's Body mass index is 46.46 kg/m.  DVT prophylaxis: enoxaparin (LOVENOX) injection 40 mg Start: 08/22/20 1000 Code Status:   Code Status: Full Code  Family Communication: plan of care discussed with patient at bedside. Status is: Inpatient Remains inpatient appropriate because:Inpatient level of care appropriate due to severity of illness Dispo:  The patient is from: ALF              Anticipated d/c is to: SNF once authorization received, TOC working.              Patient currently is medically stable to d/c.   Difficult to place patient No  Unresulted Labs (From admission, onward)     Start     Ordered   09/18/20 2353  Basic metabolic panel  Tomorrow morning,   R       Question:  Specimen collection method  Answer:  Lab=Lab collect   09/17/20 0729   09/18/20 0500  Brain natriuretic peptide  Tomorrow morning,   R       Question:  Specimen collection method  Answer:  Lab=Lab collect   09/17/20 1534   09/18/20 0500  CBC  Tomorrow morning,   R       Question:  Specimen collection method  Answer:  Lab=Lab collect   09/17/20 1538            Medications reviewed:  Scheduled Meds:  amLODipine  10 mg Oral Daily   aspirin  81 mg Oral Daily   atorvastatin  80 mg Oral QHS   carvedilol  12.5 mg Oral BID WC   empagliflozin  10 mg Oral Daily   enoxaparin (LOVENOX) injection  40 mg Subcutaneous Q24H   ferrous sulfate  325 mg Oral Daily   insulin aspart  0-9 Units Subcutaneous TID WC   insulin glargine  10 Units Subcutaneous QHS   isosorbide-hydrALAZINE  1 tablet Oral TID   pantoprazole  40 mg Oral Daily   potassium chloride  20 mEq Oral BID   pregabalin  75 mg Oral BID   sodium chloride flush  3 mL Intravenous Q12H   tamsulosin  0.8 mg Oral Daily   torsemide  40 mg Oral BID   Continuous Infusions:  sodium chloride 250 mL (08/30/20 1403)   Consultants:see note  Procedures:see note Antimicrobials: Anti-infectives (From admission, onward)    None      Culture/Microbiology    Component Value Date/Time   SDES  03/08/2020 2201    BLOOD LEFT FOREARM Performed at [facility], [address] [place], [place] [address]    [place]  03/08/2020 2201    BOTTLES DRAWN AEROBIC AND ANAEROBIC Blood Culture adequate volume Performed at [facility] [address], [place], [place] [address]     CULT  03/08/2020 2201    NO GROWTH 5 DAYS Performed at [facility] [address], [place], [place] [address]    REPTSTATUS 03/13/2020 FINAL 03/08/2020 2201    Other culture-see note  Subjective: Seen and examined.  He is frustrated for being in the hospital.  Also has been having some loose bowel movements but has had only 3 bowel moods in last 24 hours.  No other complaint.  Objective: Vitals: Today's Vitals   09/17/20 2111 09/18/20 0500 09/18/20 0524 09/18/20 0705  BP: 125/73  (!) 152/81   Pulse: 77  78   Resp: 20  20   Temp: 98.5 F (36.9 C)  98.2 F (36.8 C)  TempSrc: Oral  Oral   SpO2: 100%  96%   Weight:  (!) 138.6 kg    Height:      PainSc:    0-No pain    Intake/Output Summary (Last 24 hours) at 09/18/2020 0850 Last data filed at 09/18/2020 0439 Gross per 24 hour  Intake 120 ml  Output 2800 ml  Net -2680 ml    Filed Weights   09/14/20 0602 09/15/20 0406 09/18/20 0500  Weight: (!) 142 kg (!) 140.1 kg (!) 138.6 kg   Weight change:   Intake/Output from previous day: 07/24 0701 - 07/25 0700 In: 240 [P.O.:240] Out: 3225 [Urine:3225] Intake/Output this shift: No intake/output data recorded. Filed Weights   09/14/20 0602 09/15/20 0406 09/18/20 0500  Weight: (!) 142 kg (!) 140.1 kg (!) 138.6 kg   Examination:  General exam: Appears upset Respiratory system: Clear to auscultation. Respiratory effort normal. Cardiovascular system: S1 & S2 heard, RRR. No JVD, murmurs, rubs, gallops or clicks.  +2 pitting edema bilateral lower extremity with hyperpigmentation and chronic appearing hyperkeratosis. Gastrointestinal system: Abdomen is nondistended, soft and nontender. No organomegaly or masses felt. Normal bowel sounds heard. Central nervous system: Alert and oriented. No focal neurological deficits. Extremities: Symmetric 5 x 5 power. Skin: No rashes, lesions or ulcers.  Psychiatry: Judgement and insight appear normal. Mood & affect appropriate.   Data  Reviewed: I have personally reviewed following labs and imaging studies CBC: No results for input(s): WBC, NEUTROABS, HGB, HCT, MCV, PLT in the last 168 hours.  Basic Metabolic Panel: Recent Labs  Lab 09/11/20 1022 09/14/20 0656 09/16/20 0843  NA 139 139 139  K 3.6 3.8 3.4*  CL 109 103 105  CO2 25 25 25   GLUCOSE 177* 190* 169*  BUN 44* 40* 45*  CREATININE 1.43* 1.54* 1.54*  CALCIUM 8.7* 9.2 8.9    GFR: Estimated Creatinine Clearance: 70.5 mL/min (A) (by C-G formula based on SCr of 1.54 mg/dL (H)). Liver Function Tests: No results for input(s): AST, ALT, ALKPHOS, BILITOT, PROT, ALBUMIN in the last 168 hours. No results for input(s): LIPASE, AMYLASE in the last 168 hours. No results for input(s): AMMONIA in the last 168 hours. Coagulation Profile: No results for input(s): INR, PROTIME in the last 168 hours. Cardiac Enzymes: No results for input(s): CKTOTAL, CKMB, CKMBINDEX, TROPONINI in the last 168 hours. BNP (last 3 results) No results for input(s): PROBNP in the last 8760 hours. HbA1C: No results for input(s): HGBA1C in the last 72 hours. CBG: Recent Labs  Lab 09/17/20 0744 09/17/20 1127 09/17/20 1604 09/17/20 2108 09/18/20 0725  GLUCAP 137* 155* 174* 217* 170*    Lipid Profile: No results for input(s): CHOL, HDL, LDLCALC, TRIG, CHOLHDL, LDLDIRECT in the last 72 hours. Thyroid Function Tests: No results for input(s): TSH, T4TOTAL, FREET4, T3FREE, THYROIDAB in the last 72 hours. Anemia Panel: No results for input(s): VITAMINB12, FOLATE, FERRITIN, TIBC, IRON, RETICCTPCT in the last 72 hours. Sepsis Labs: No results for input(s): PROCALCITON, LATICACIDVEN in the last 168 hours.  No results found for this or any previous visit (from the past 240 hour(s)).     Radiology Studies: No results found.   LOS: 27 days   Time spent: 29 minutes Darliss Cheney, MD [facility]  09/18/2020, 8:50 AM

## 2020-09-18 NOTE — Plan of Care (Signed)
  Problem: Activity: Goal: Risk for activity intolerance will decrease Outcome: Progressing   Problem: Coping: Goal: Level of anxiety will decrease Outcome: Progressing   Problem: Education: Goal: Ability to demonstrate management of disease process will improve Outcome: Progressing Goal: Ability to verbalize understanding of medication therapies will improve Outcome: Progressing   Problem: Cardiac: Goal: Ability to achieve and maintain adequate cardiopulmonary perfusion will improve Outcome: Progressing

## 2020-09-19 ENCOUNTER — Other Ambulatory Visit: Payer: Self-pay | Admitting: *Deleted

## 2020-09-19 DIAGNOSIS — I5033 Acute on chronic diastolic (congestive) heart failure: Secondary | ICD-10-CM | POA: Diagnosis not present

## 2020-09-19 LAB — GLUCOSE, CAPILLARY
Glucose-Capillary: 154 mg/dL — ABNORMAL HIGH (ref 70–99)
Glucose-Capillary: 206 mg/dL — ABNORMAL HIGH (ref 70–99)
Glucose-Capillary: 185 mg/dL — ABNORMAL HIGH (ref 70–99)
Glucose-Capillary: 170 mg/dL — ABNORMAL HIGH (ref 70–99)

## 2020-09-19 MED ORDER — INSULIN GLARGINE 100 UNIT/ML ~~LOC~~ SOLN
15.0000 [IU] | Freq: Every day | SUBCUTANEOUS | Status: DC
  Administered 2020-09-19 – 2020-09-21 (×3): 15 [IU] via SUBCUTANEOUS
  Filled 2020-09-19 (×3): qty 0.15

## 2020-09-19 NOTE — Patient Outreach (Signed)
[facility]) Care Management  09/19/2020  [name] 12-Mar-1960 [id number]   Case Closure  Pt int status since 6/27 >10 days. Case will be closed and provider notified.  Raina Mina, RN Care Management Coordinator [place] Office [phone]

## 2020-09-19 NOTE — TOC Progression Note (Signed)
Transition of Care [facility]) - Progression Note    Patient Details  Name: [name] MRN: [id number] Date of Birth: 08/17/1960  Transition of Care [facility]) CM/SW Contact  Doyle Kunath, Juliann Pulse, RN Phone Number: 09/19/2020, 12:29 PM  Clinical Narrative: -[facility]-they are open for patients-Denied for [facility] SNF-Patient agree to a expidited appeal (Peer to peer) MD to MD Please call tel#[phone];pre auth #[id number].MD notified await outcome if MD agree or not.      Expected Discharge Plan: Skilled Nursing Facility Barriers to Discharge: SNF Authorization Denied  Expected Discharge Plan and Services Expected Discharge Plan: [place]   Discharge Planning Services: CM Consult Post Acute Care Choice: [place] arrangements for the past 2 months: [place] Expected Discharge Date: 09/08/20                                     Social Determinants of Health (SDOH) Interventions    Readmission Risk Interventions Readmission Risk Prevention Plan 01/12/2020  Transportation Screening Complete  Palliative Care Screening Not Applicable  Some recent data might be hidden

## 2020-09-19 NOTE — Progress Notes (Signed)
PROGRESS NOTE    [name]  HCW:[id number] DOB: October 25, 1960 DOA: 08/21/2020 PCP: [name], NP   Chief Complaint  Patient presents with   Chest Pain   Leg Swelling    Brief Narrative: 60 year old male with chronic diastolic CHF, diabetes, morbid obesity, history of presented with chest pain shortness of breath swelling.  He was found to have acute on chronic diastolic CHF with anasarca, was placed on IV Lasix 60 and was admitted.  He also had AKI and creatinine improved with IV diuresis.  He developed colonic ileus that improved with conservative management.  He was seen by PT OT who have advised skilled nursing facility.  His fluid status improved with IV Lasix.We will continue with home chf meds including torsemide. Continue fluid restriction salt restriction.He is being discharged to skilled nursing facility pending bed available.  With ongoing persistent swelling his torsemide was increased to 40 mg twice daily having good diuresis and weight is significantly improving. He is medically stable awaiting for skilled nursing facility.  Assessment & Plan: #Acute on chronic heart failure with preserved ejection fraction #Anasarca with lower extremity swelling/scrotal edema - Initially treated with IV lasix, - c/w torsemide 40mg  PO BID and rest of the medications. - currently holding Entresto 2/2 AKI, can resume on discharge - strict I/O and he is completely symptom-free.  AKI on CKD stage IIIa Cardiorenal syndrome  - Initial Cr 2.0, baseline 1.3-1.6 prior - has overall improved with diuresis Creatinine now back to baseline.  We will repeat tomorrow. Recent Labs  Lab 09/14/20 0656 09/16/20 0843 09/18/20 1018  BUN 40* 45* 46*  CREATININE 1.54* 1.54* 1.67*   Ileus  - KUB ON 7/5 showing ileus, patient has responded to bowel regimen - CT abdomen pelvis 7/7 no evidence of bowel obstruction or segment volvulus managed conservatively and now resolved.  Diarrhea: Improving.   No signs of infection so doubt C. difficile.  He has already been tested negative recently for GI panel and C. difficile.  Continue Imodium.  RUE swelling negative for DVT on duplex  Chronic conditions: Hx of CAD Hx of OM1 subtotal occlusion per [facility] 06/2019 - c/w asa, statin  Hx of Stroke history with right-sided weakness .  Stable, continues aspirin and Lipitor  Pericardial effusion is stable with no signs of cardiac tamponade  Depression-mood stable, c/w sertraline  BPH-c/w tamsulosin  Type 2 diabetes c/b peripheral neuropathy A1c poorly controled 9.1.   - home regimen: insulin glargine 30u qhs Still hyperglycemic despite of receiving 10 units of Lantus last night, will increase to 15 units tonight.  Continue SSI. - c/w pregabalin - c/w empagliflozin  Recent Labs  Lab 09/18/20 1143 09/18/20 1618 09/18/20 2059 09/19/20 0740 09/19/20 1203  GLUCAP 218* 205* 192* 154* 206*    Anemia of chronic disease Iron deficiency anemia - Ferritin <100 which is suspicious for iron deficiency component - trend CBC periodically - FeSO4 PO  Debility/physical deconditioning: -Continue PT OT. Prior auth pending for placement   Morbid obesity BMI 47  - while there is significant fluid contribution, will benefit from weight loss, outpatient PCP follow-up  Essential hypertension: BP is moderately well controlled  - home regimen: amlodipine, Coreg, Imdur.  Diet Order             Diet Heart Room service appropriate? Yes with Assist; Fluid consistency: Thin; Fluid restriction: 2000 mL Fluid  Diet effective now           Diet - low sodium heart  healthy                 Patient's Body mass index is 44.82 kg/m.  DVT prophylaxis: enoxaparin (LOVENOX) injection 40 mg Start: 08/22/20 1000 Code Status:   Code Status: Full Code  Family Communication: plan of care discussed with patient at bedside. Status is: Inpatient Remains inpatient appropriate because:Inpatient level of care  appropriate due to severity of illness Dispo: The patient is from: ALF              Anticipated d/c is to: SNF once authorization received, TOC working.              Patient currently is medically stable to d/c.   Difficult to place patient No  Unresulted Labs (From admission, onward)    None       Medications reviewed:  Scheduled Meds:  amLODipine  10 mg Oral Daily   aspirin  81 mg Oral Daily   atorvastatin  80 mg Oral QHS   carvedilol  12.5 mg Oral BID WC   empagliflozin  10 mg Oral Daily   enoxaparin (LOVENOX) injection  40 mg Subcutaneous Q24H   ferrous sulfate  325 mg Oral Daily   insulin aspart  0-9 Units Subcutaneous TID WC   insulin glargine  15 Units Subcutaneous QHS   isosorbide-hydrALAZINE  1 tablet Oral TID   pantoprazole  40 mg Oral Daily   potassium chloride  20 mEq Oral BID   pregabalin  75 mg Oral BID   sodium chloride flush  3 mL Intravenous Q12H   tamsulosin  0.8 mg Oral Daily   torsemide  40 mg Oral BID   Continuous Infusions:  sodium chloride 250 mL (08/30/20 1403)   Consultants:see note  Procedures:see note Antimicrobials: Anti-infectives (From admission, onward)    None      Culture/Microbiology    Component Value Date/Time   SDES  03/08/2020 2201    BLOOD LEFT FOREARM Performed at [facility], [address], [name], [place] [address]    [place]  03/08/2020 2201    BOTTLES DRAWN AEROBIC AND ANAEROBIC Blood Culture adequate volume Performed at [facility] [address], [place], [place] [address]    CULT  03/08/2020 2201    NO GROWTH 5 DAYS Performed at [facility] [address] [place], [place] [address]    REPTSTATUS 03/13/2020 FINAL 03/08/2020 2201    Other culture-see note  Subjective: Patient seen and examined.  He has no new complaint.  He is feeling better today.  Objective: Vitals: Today's Vitals   09/19/20 0900 09/19/20 0946 09/19/20 1031 09/19/20 1229  BP:    (!) 158/93   Pulse:    75  Resp:    18  Temp:    (!) 97.5 F (36.4 C)  TempSrc:    Oral  SpO2:    95%  Weight:      Height:      PainSc: 0-No pain 9  0-No pain     Intake/Output Summary (Last 24 hours) at 09/19/2020 1404 Last data filed at 09/19/2020 0900 Gross per 24 hour  Intake 600 ml  Output 5600 ml  Net -5000 ml    Filed Weights   09/15/20 0406 09/18/20 0500 09/19/20 0600  Weight: (!) 140.1 kg (!) 138.6 kg 133.7 kg   Weight change: -4.9 kg  Intake/Output from previous day: 07/25 0701 - 07/26 0700 In: 600 [P.O.:600] Out: 6900 [Urine:6900] Intake/Output this shift: Total  I/O In: 360 [P.O.:360] Out: 600 [Urine:600] Filed Weights   09/15/20 0406 09/18/20 0500 09/19/20 0600  Weight: (!) 140.1 kg (!) 138.6 kg 133.7 kg   Examination: General exam: Appears calm and comfortable  Respiratory system: Clear to auscultation. Respiratory effort normal. Cardiovascular system: S1 & S2 heard, RRR. No JVD, murmurs, rubs, gallops or clicks. No pedal edema. Gastrointestinal system: Abdomen is nondistended, soft and nontender. No organomegaly or masses felt. Normal bowel sounds heard. Central nervous system: Alert and oriented. No focal neurological deficits. Extremities: Symmetric 5 x 5 power. Skin: +2 pitting edema bilateral lower extremity with chronic appearing hyperpigmentation and hyperkeratosis Psychiatry: Judgement and insight appear normal. Mood & affect appropriate.   Data Reviewed: I have personally reviewed following labs and imaging studies CBC: Recent Labs  Lab 09/18/20 1018  WBC 3.6*  HGB 8.9*  HCT 28.5*  MCV 80.3  PLT 578    Basic Metabolic Panel: Recent Labs  Lab 09/14/20 0656 09/16/20 0843 09/18/20 1018  NA 139 139 138  K 3.8 3.4* 3.2*  CL 103 105 103  CO2 25 25 27   GLUCOSE 190* 169* 229*  BUN 40* 45* 46*  CREATININE 1.54* 1.54* 1.67*  CALCIUM 9.2 8.9 8.7*    GFR: Estimated Creatinine Clearance: 63.7 mL/min (A) (by C-G formula based on SCr of 1.67 mg/dL  (H)). Liver Function Tests: No results for input(s): AST, ALT, ALKPHOS, BILITOT, PROT, ALBUMIN in the last 168 hours. No results for input(s): LIPASE, AMYLASE in the last 168 hours. No results for input(s): AMMONIA in the last 168 hours. Coagulation Profile: No results for input(s): INR, PROTIME in the last 168 hours. Cardiac Enzymes: No results for input(s): CKTOTAL, CKMB, CKMBINDEX, TROPONINI in the last 168 hours. BNP (last 3 results) No results for input(s): PROBNP in the last 8760 hours. HbA1C: No results for input(s): HGBA1C in the last 72 hours. CBG: Recent Labs  Lab 09/18/20 1143 09/18/20 1618 09/18/20 2059 09/19/20 0740 09/19/20 1203  GLUCAP 218* 205* 192* 154* 206*    Lipid Profile: No results for input(s): CHOL, HDL, LDLCALC, TRIG, CHOLHDL, LDLDIRECT in the last 72 hours. Thyroid Function Tests: No results for input(s): TSH, T4TOTAL, FREET4, T3FREE, THYROIDAB in the last 72 hours. Anemia Panel: No results for input(s): VITAMINB12, FOLATE, FERRITIN, TIBC, IRON, RETICCTPCT in the last 72 hours. Sepsis Labs: No results for input(s): PROCALCITON, LATICACIDVEN in the last 168 hours.  No results found for this or any previous visit (from the past 240 hour(s)).    Radiology Studies: No results found.   LOS: 28 days   Time spent: 28 minutes  Darliss Cheney, MD [facility]  09/19/2020, 2:04 PM

## 2020-09-19 NOTE — Plan of Care (Signed)
  Problem: Pain Managment: Goal: General experience of comfort will improve Outcome: Completed/Met

## 2020-09-19 NOTE — TOC Progression Note (Signed)
Transition of Care [facility]) - Progression Note    Patient Details  Name: CLEOTHA TSANG MRN: [id number] Date of Birth: 10-06-1960  Transition of Care [facility]) CM/SW Contact  Onelia Cadmus, Juliann Pulse, RN Phone Number: 09/19/2020, 2:45 PM  Clinical Narrative: Expidited appeal for [facility] initiated.      Expected Discharge Plan: Skilled Nursing Facility Barriers to Discharge: SNF Authorization Denied  Expected Discharge Plan and Services Expected Discharge Plan: [place]   Discharge Planning Services: CM Consult Post Acute Care Choice: [place] arrangements for the past 2 months: [place] Expected Discharge Date: 09/08/20                                     Social Determinants of Health (SDOH) Interventions    Readmission Risk Interventions Readmission Risk Prevention Plan 01/12/2020  Transportation Screening Complete  Palliative Care Screening Not Applicable  Some recent data might be hidden

## 2020-09-19 NOTE — Progress Notes (Signed)
Occupational Therapy Treatment Patient Details Name: [name] MRN: [id number] DOB: 1961-02-05 Today's Date: 09/19/2020    History of present illness 60 year old male with chronic diastolic CHF, diabetes, morbid obesity, history of presented with chest pain shortness of breath swelling.  He was found to have acute on chronic diastolic CHF with anasarca, was placed on IV Lasix 60 and was admitted.  He also had AKI and creatinine improved with IV diuresis.  He developed colonic ileus that improved with conservative management.  He was seen by PT OT and has advised skilled nursing facility.  Patient fluid status improved with IV Lasix.We will continue with home chf meds including torsemide. Continue fluid restriction salt restriction.He is being discharged to skilled nursing facility pending bed available.   OT comments  Patient progressing and showed improved ability to move BLEs in bed to assist with bed mobility compared to previous session, however remains profoundly weak. Patient remains limited by decreased awareness of impairments, decreased strength and activity tolerance as well as pain and continued lower body edema, along with deficits noted below. Pt continues to demonstrate fair rehab potential and would benefit from continued skilled OT to increase safety and independence with ADLs and functional transfers to allow pt to return home safely and reduce caregiver burden and fall risk.   Follow Up Recommendations  SNF    Equipment Recommendations  Other (comment)    Recommendations for Other Services      Precautions / Restrictions Precautions Precautions: Fall Precaution Comments: edematous LE, scrotum Restrictions Weight Bearing Restrictions: No       Mobility Bed Mobility Overal bed mobility: Needs Assistance Bed Mobility: Rolling;Supine to Sit;Sit to Supine;Sidelying to Sit Rolling: Max assist Sidelying to sit: Max assist;HOB elevated Supine to sit: +2 for physical  assistance;HOB elevated;Max assist Sit to supine: Max assist;+2 for physical assistance;HOB elevated   General bed mobility comments: Rolling to L and R sides with Max A. Assist for trunk and bil LEs. Once EOB, pt able to scoot anteriorly with Min guard for trunk stability and heavy use of UEs. Utilized bedpad for scooting, positioning. Increased time/effort with all position changes. Pt needs frequent rest breaks.    Transfers                      Balance   Sitting-balance support: Feet supported;Bilateral upper extremity supported Sitting balance-Leahy Scale: Fair Sitting balance - Comments: Pt able to eventually rest UEs in lap and balance at EOB. Pt initiated weight shifting to LT and RT. Pt tolerated EOB sitting x~6 min then asked to return to supine due to back pain and fatigue.                                   ADL either performed or assessed with clinical judgement   ADL Overall ADL's : Needs assistance/impaired     Grooming: Set up;Bed level;Wash/dry face Grooming Details (indicate cue type and reason): Cues to initiate. Upper Body Bathing: Maximal assistance;Bed level   Lower Body Bathing: Total assistance;Bed level Lower Body Bathing Details (indicate cue type and reason): +2 people required for bed level bathing. Pt assisted into sidelying position RT and LT with Max Assist of 2 people and use of draw pad.     Lower Body Dressing: Total assistance;Bed level     Toilet Transfer Details (indicate cue type and reason): unable Toileting- Clothing Manipulation and Hygiene: Total  assistance;+2 for physical assistance;Bed level Toileting - Clothing Manipulation Details (indicate cue type and reason): +2 patient for pericare in bed after being found to be incontinent of BM during bed level sponge bathing.     Functional mobility during ADLs: Maximal assistance;+2 for physical assistance General ADL Comments: Bed mobility including supine<>Sit only.   total Assist to scoot in supine.     Vision       Perception     Praxis      Cognition Arousal/Alertness: Awake/alert Behavior During Therapy: WFL for tasks assessed/performed Overall Cognitive Status: Within Functional Limits for tasks assessed                                 General Comments: Can become irritable when feels hurried. Does well with slow step by step cues.        Exercises Other Exercises Other Exercises: Noted orange T-band tied to bed RT side of bed. Pt declined to demonstrate exercises he has been doing due to "I don't want to overdo it."   Shoulder Instructions       General Comments      Pertinent Vitals/ Pain       Pain Assessment: Faces Faces Pain Scale: Hurts whole lot Pain Location: low back Pain Descriptors / Indicators: Spasm Pain Intervention(s): Premedicated before session;Repositioned;Monitored during session;Limited activity within patient's tolerance  Home Living                                          Prior Functioning/Environment              Frequency  Min 2X/week        Progress Toward Goals  OT Goals(current goals can now be found in the care plan section)  Progress towards OT goals: Progressing toward goals  Acute Rehab OT Goals Patient Stated Goal: Not to be rushed OT Goal Formulation: With patient Time For Goal Achievement: 09/20/20 Potential to Achieve Goals: [place] Discharge plan remains appropriate    Co-evaluation                 AM-PAC OT "6 Clicks" Daily Activity     Outcome Measure   Help from another person eating meals?: A Little Help from another person taking care of personal grooming?: A Little Help from another person toileting, which includes using toliet, bedpan, or urinal?: Total Help from another person bathing (including washing, rinsing, drying)?: A Lot Help from another person to put on and taking off regular upper body clothing?: A  Little Help from another person to put on and taking off regular lower body clothing?: Total 6 Click Score: 13    End of Session    OT Visit Diagnosis: Other abnormalities of gait and mobility (R26.89);Muscle weakness (generalized) (M62.81);Hemiplegia and hemiparesis;Pain Hemiplegia - Right/Left: Right Hemiplegia - dominant/non-dominant: Dominant Hemiplegia - caused by: Cerebral infarction Pain - Right/Left: Right Pain - part of body: Shoulder   Activity Tolerance Patient limited by pain;Patient limited by fatigue   Patient Left in bed;with call bell/phone within reach;with nursing/sitter in room   Nurse Communication Mobility status (Coordinated with CNA for bathing and bed mobility.)        Time: 3086-5784 OT Time Calculation (min): 23 min  Charges: OT General Charges $OT Visit: 1 Visit OT Treatments $Self Care/Home  Management : 8-22 mins $Therapeutic Activity: 8-22 mins  Anderson Malta, [place] Office: [phone] 09/19/2020   Julien Girt 09/19/2020, 10:41 AM

## 2020-09-20 LAB — CBC WITH DIFFERENTIAL/PLATELET
Abs Immature Granulocytes: 0.01 10*3/uL (ref 0.00–0.07)
Basophils Absolute: 0 10*3/uL (ref 0.0–0.1)
Basophils Relative: 1 %
Eosinophils Absolute: 0.7 10*3/uL — ABNORMAL HIGH (ref 0.0–0.5)
Eosinophils Relative: 17 %
HCT: 31.2 % — ABNORMAL LOW (ref 39.0–52.0)
Hemoglobin: 9.5 g/dL — ABNORMAL LOW (ref 13.0–17.0)
Immature Granulocytes: 0 %
Lymphocytes Relative: 31 %
Lymphs Abs: 1.2 10*3/uL (ref 0.7–4.0)
MCH: 24.7 pg — ABNORMAL LOW (ref 26.0–34.0)
MCHC: 30.4 g/dL (ref 30.0–36.0)
MCV: 81.3 fL (ref 80.0–100.0)
Monocytes Absolute: 0.5 10*3/uL (ref 0.1–1.0)
Monocytes Relative: 14 %
Neutro Abs: 1.4 10*3/uL — ABNORMAL LOW (ref 1.7–7.7)
Neutrophils Relative %: 37 %
Platelets: 191 10*3/uL (ref 150–400)
RBC: 3.84 MIL/uL — ABNORMAL LOW (ref 4.22–5.81)
RDW: 15.9 % — ABNORMAL HIGH (ref 11.5–15.5)
WBC: 3.9 10*3/uL — ABNORMAL LOW (ref 4.0–10.5)
nRBC: 0 % (ref 0.0–0.2)

## 2020-09-20 LAB — COMPREHENSIVE METABOLIC PANEL
ALT: 27 U/L (ref 0–44)
AST: 20 U/L (ref 15–41)
Albumin: 2.8 g/dL — ABNORMAL LOW (ref 3.5–5.0)
Alkaline Phosphatase: 76 U/L (ref 38–126)
Anion gap: 11 (ref 5–15)
BUN: 44 mg/dL — ABNORMAL HIGH (ref 6–20)
CO2: 30 mmol/L (ref 22–32)
Calcium: 8.9 mg/dL (ref 8.9–10.3)
Chloride: 99 mmol/L (ref 98–111)
Creatinine, Ser: 1.58 mg/dL — ABNORMAL HIGH (ref 0.61–1.24)
GFR, Estimated: 50 mL/min — ABNORMAL LOW (ref >60)
Glucose, Bld: 147 mg/dL — ABNORMAL HIGH (ref 70–99)
Potassium: 3.4 mmol/L — ABNORMAL LOW (ref 3.5–5.1)
Sodium: 140 mmol/L (ref 135–145)
Total Bilirubin: 0.4 mg/dL (ref 0.3–1.2)
Total Protein: 6.7 g/dL (ref 6.5–8.1)

## 2020-09-20 LAB — GLUCOSE, CAPILLARY
Glucose-Capillary: 143 mg/dL — ABNORMAL HIGH (ref 70–99)
Glucose-Capillary: 219 mg/dL — ABNORMAL HIGH (ref 70–99)
Glucose-Capillary: 166 mg/dL — ABNORMAL HIGH (ref 70–99)
Glucose-Capillary: 171 mg/dL — ABNORMAL HIGH (ref 70–99)

## 2020-09-20 LAB — PHOSPHORUS: Phosphorus: 4.9 mg/dL — ABNORMAL HIGH (ref 2.5–4.6)

## 2020-09-20 LAB — MAGNESIUM: Magnesium: 1.8 mg/dL (ref 1.7–2.4)

## 2020-09-20 NOTE — Progress Notes (Signed)
PT Cancellation Note  Patient Details Name: [name] MRN: [id number] DOB: 18-Apr-1960   Cancelled Treatment:     pt declined any activity.  "I've got things on my mind".  Pt expressed frustration that he "has no place to go".  Will continue to follow and attempt another day.     Rica Koyanagi  PTA Acute  Rehabilitation Services Pager      [phone] Office      [phone]

## 2020-09-20 NOTE — Plan of Care (Signed)
  Problem: Education: Goal: Knowledge of General Education information will improve Description: Including pain rating scale, medication(s)/side effects and non-pharmacologic comfort measures Outcome: Progressing   Problem: Clinical Measurements: Goal: Will remain free from infection Outcome: Progressing Goal: Cardiovascular complication will be avoided Outcome: Progressing   Problem: Skin Integrity: Goal: Risk for impaired skin integrity will decrease Outcome: Progressing   Problem: Education: Goal: Ability to demonstrate management of disease process will improve Outcome: Progressing Goal: Ability to verbalize understanding of medication therapies will improve Outcome: Progressing Goal: Individualized Educational Video(s) Outcome: Progressing   Problem: Cardiac: Goal: Ability to achieve and maintain adequate cardiopulmonary perfusion will improve Outcome: Progressing

## 2020-09-20 NOTE — TOC Initial Note (Addendum)
Transition of Care [facility]) - Initial/Assessment Note    Patient Details  Name: [name] MRN: [id number] Date of Birth: 1960-10-21  Transition of Care [facility]) CM/SW Contact:    Dessa Phi, RN Phone Number: 09/20/2020, 9:44 AM  Clinical Narrative: The denial received from [place] per rep Grace-09/19/20             Per patient agreement-Expidited appeal initiated-fax# confirmation [phone]. Case Auth#[id number];member #[phone]. Tel# [phone].  CM will f/u for outcome of expidited appeal-they have 3 days to respond. THN noted case closed-CM left vm w/rep to inform patient is still in hospital awaiting appeal outcome for SNF-depending on outcome may d/c home-they should continue to follow if needs for home.   12:13p-Spoke to patient about his insurance to answer his concerns-Has Primary insurance-[employer] medicare;2nd medicaid [place] access-explained to patient recc is for ST SNF-[employer] medicare-covers-we are waiting for response on expidited appeal;medicaid is for long term care-this is not recc therefore cannot pursue his medicaid for ST SNF the current recc-voiced understanding.            Expected Discharge Plan: Skilled Nursing Facility Barriers to Discharge: SNF Authorization Denied   Patient Goals and CMS Choice Patient states their goals for this hospitalization and ongoing recovery are:: return back to [facility] CMS Medicare.gov Compare Post Acute Care list provided to:: Patient Choice offered to / list presented to : Patient  Expected Discharge Plan and Services Expected Discharge Plan: [place]   Discharge Planning Services: CM Consult Post Acute Care Choice: [place] arrangements for the past 2 months: [place] Expected Discharge Date: 09/08/20                                    Prior Living Arrangements/Services Living arrangements for the past 2 months: [place] Lives with::  Facility Resident Patient language and need for interpreter reviewed:: Yes Do you feel safe going back to the place where you live?: Yes      Need for Family Participation in Patient Care: No (Comment) Care giver support system in place?: Yes (comment)   Criminal Activity/Legal Involvement Pertinent to Current Situation/Hospitalization: No - Comment as needed  Activities of Daily Living Home Assistive Devices/Equipment: Cane (specify quad or straight), CBG Meter, Blood pressure cuff, Scales, Grab bars around toilet, Grab bars in shower, Hand-held shower hose ADL Screening (condition at time of admission) Patient's cognitive ability adequate to safely complete daily activities?: Yes Is the patient deaf or have difficulty hearing?: No Does the patient have difficulty seeing, even when wearing glasses/contacts?: No Does the patient have difficulty concentrating, remembering, or making decisions?: No Patient able to express need for assistance with ADLs?: Yes Does the patient have difficulty dressing or bathing?: No Independently performs ADLs?: Yes (appropriate for developmental age) Does the patient have difficulty walking or climbing stairs?: Yes (secondary to leg swelling and pain) Weakness of Legs: Both Weakness of Arms/Hands: None  Permission Sought/Granted Permission sought to share information with : Case Manager Permission granted to share information with : Yes, Verbal Permission Granted  Share Information with NAME: Case Manager     Permission granted to share info w Relationship: [name] brother [phone]     Emotional Assessment Appearance:: Appears stated age Attitude/Demeanor/Rapport: Gracious Affect (typically observed): Accepting Orientation: : Oriented to Self, Oriented to Place, Oriented to  Time, Oriented to  Situation Alcohol / Substance Use: Not Applicable Psych Involvement: No (comment)  Admission diagnosis:  Peripheral edema [R60.9] Acute on chronic diastolic  CHF (congestive heart failure) (HCC) [I50.33] Acute on chronic congestive heart failure, unspecified heart failure type [facility]) [I50.9] Patient Active Problem List   Diagnosis Date Noted   AKI (acute kidney injury) ([place]) 08/22/2020   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 08/22/2020   Pericardial effusion 08/22/2020   Acute on chronic diastolic CHF (congestive heart failure) ([place]) 08/22/2020   TIA (transient ischemic attack) 07/03/2020   Acute on chronic heart failure with preserved ejection fraction ([place])    Pulmonary artery hypertension ([place]) 06/25/2020   CKD (chronic kidney disease) 06/25/2020   History of cerebrovascular accident (CVA) with residual deficit 06/25/2020   CKD stage IIIa ([place]) 03/30/2020   Morbid (severe) obesity due to excess calories ([place]) 03/30/2020   Malnutrition of moderate degree 01/25/2020   Palliative care by specialist    Goals of care, counseling/discussion    Nephrotic syndrome 01/06/2020   Anasarca 01/06/2020   Hypokalemia due to loss of potassium 01/04/2020   Generalized body aches 01/01/2020   Non-traumatic rhabdomyolysis 01/01/2020   Grade I diastolic dysfunction 62/22/9798   Microcytic anemia 01/01/2020   Hypokalemia 12/31/2019   Elevated CK    Chronic diastolic CHF (congestive heart failure) ([place]) 12/31/2017   Diabetes mellitus type 2, uncontrolled ([place])    Pure hypercholesterolemia    Hyperglycemia 09/28/2017   Nonobstructive atherosclerosis of coronary artery 09/28/2017   MDD (major depressive disorder), recurrent episode, severe ([place]) 07/30/2017   Osteoarthritis 04/12/2015   Severe recurrent major depression with psychotic features ([place]) 01/04/2015   Substance-related disorder ([place]) 10/30/2014   Patient's noncompliance with other medical treatment and regimen 06/07/2014   Cocaine dependence, uncomplicated ([place]) 92/12/9415   Chronic pain 05/31/2014   Cocaine abuse ([place]) 05/31/2014   History of noncompliance with medical treatment  05/31/2014   Malingering 05/31/2014   Leukopenia 05/31/2014   Diabetic neuropathy associated with diabetes mellitus due to underlying condition ([place]) 05/31/2014   Diabetic neuropathy ([place]) 05/31/2014   Congestive heart failure with left ventricular systolic dysfunction ([place]) 02/22/2014   Depression, major, recurrent, severe with psychosis ([place]) 08/29/2013   Suicidal ideations 08/28/2013   Uncontrolled diabetes mellitus ([employer]) 06/30/2013   Atypical chest pain 12/09/2011   Essential hypertension 12/09/2011   Episodic mood disorder ([place]) 05/13/2011   PCP:  [name], NP Pharmacy:   [id number] [place], [place] [address] [place] [address] Phone: [phone] Fax: [phone]  [facility] - [place], [place] - [address] [place] [address] Phone: [phone] Fax: [phone]     Social Determinants of Health (SDOH) Interventions    Readmission Risk Interventions Readmission Risk Prevention Plan 01/12/2020  Transportation Screening Complete  Palliative Care Screening Not Applicable  Some recent data might be hidden

## 2020-09-20 NOTE — Progress Notes (Signed)
PROGRESS NOTE    [name]  UQJ:[id number] DOB: August 10, 1960 DOA: 08/21/2020 PCP: [name], NP   Brief Narrative:  The patient is a 60 year old African-American male with a past medical history significant for chronic diastolic CHF, diabetes mellitus type 2, morbid obesity, hypertension as well as other comorbidities who presented with a chief complaint of chest pain or shortness of breath associated swelling.  He was found to have acute on chronic diastolic CHF with anasarca and placed on IV Lasix 60 mg and was admitted.  Subsequently had an AKI and creatinine had improved with diuresis.  He then also developed a colonic ileus that improved with conservative management.  He was seen by PT OT who advised skilled nursing facility.  Since his fluid status is improved with IV Lasix we will continue his home CHF medications including torsemide.  Also will continue with fluid and salt restriction.  Currently medically stable to be discharged to skilled nursing facility once bed is available.  Given his ongoing persistent swelling his torsemide was increased to 40 mg p.o. twice daily and he continues to have good diuresis with his weights improving.  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 torsemide and is now on p.o. torsemide 40  mg twice daily and will continue -Strict I's and O's and daily weights; patient is -21.4 L since admission and weight is down to 301 pounds today from 330 on admission -BMP is now 122.1 -Continue with carvedilol 12.5 hours p.o. twice daily and with isosorbide-hydralazine 20-37.5 mg 1 tab p.o. 3 times daily; replete with potassium chloride 20 mEQ twice daily -Currently holding his Entresto due to an AKI but likely can be resumed at discharge -Continue to monitor volume status carefully and continue to monitor for signs and symptoms of volume overload -Repeat chest x-ray in a.m.  GERD -Continue with PPI with pantoprazole 40 mg p.o. daily  Hypokalemia -In the setting of diuresis -Patient's potassium-3.2 and today is now 3.49-replete with p.o. potassium chloride 20 mEq twice daily x2 doses and will give an additional 40 mEq today -Magnesium levels 1.8 -continue monitor and replete as necessary next-repeat CMP in a.m.  Normocytic Anemia/Anemia of Chronic Disease/Iron Deficiency Anemia  -Patient's Hgb/Hct went from 8.9/28.5 -> 9.5/31.2 -Checked me panel a month ago at 08/23/2020 and showed an iron level of 30, U IBC of 216, TIBC of 246 saturation ratios of 12%, ferritin level 72, folate of 7.2 -Continue with Ferrous Sulfate 325 mg p.o. daily -Continue to Monitor for S/Sx of Bleeding; No overt bleeding noted -Repeat CBC in the AM   AKI on CKD stage IIIa Cardiorenal syndrome -Initial Cr 2.0, baseline 1.3-1.6 prior -Has overall improved with diuresis -Creatinine now back to baseline; BUN/Cr has gone from 46/1.67 -> 44/1.58 -Avoid further nephrotoxic medications, contrast dyes, hypotension and renally dose medications -Repeat CMP in  a.m.  Ileus -KUB ON 7/5 showing ileus, patient has responded to bowel regimen -CT abdomen pelvis 7/7 no evidence of bowel obstruction or segment volvulus managed conservatively and now resolved. -Bowel regimen has now been discontinued given his diarrhea    Diarrhea -Improving.   -No signs of infection so doubt C. difficile.  He has already been tested negative recently for GI panel and C. difficile.   -Continue loperamide 2 mg p.o. daily PRN .   RUE swelling  -Negative for DVT on duplex   Hx of CAD -Hx of OM1 subtotal occlusion per [facility] 06/2019 -C/w ASA 81 mg po Daily, Atorvastatin 80 mg po qHS, and Carvedilol 12.5 mg po BID    Hx of Stroke  -History with right-sided weakness .   -Stable, C/w ASA 81 mg po Daily, Atorvastatin 80 mg po qHS  Pericardial Effusion  -Is stable with no signs of cardiac tamponade   Depression  -Mood stable   BPH -C/w Tamsulosin 0.8 mg po Daily    Type 2 Diabetes c/b Peripheral Neuropathy -HbA1c poorly controled 9.1.   -Home regimen: insulin glargine 30u qhs -Still hyperglycemic despite of receiving 10 units of Lantus last night, will increase to 15 units tonight.  -Continue SSI. -C/w pregabalin -C/w empagliflozin -CBG's ranging from 166-218   Debility/physical deconditioning: -Continue PT OT.  Insurance authorization is pending   Leukopenia -Mild -The patient WBC went from 3.6 now 3.9 -Continue to monitor and trend and repeat CBC in the AM    Essential Hypertension -BP is moderately well controlled -C/w Home Regimen with amlodipine 10 mg p.o. daily, carvedilol 12.5 mg p.o. twice daily, isosorbide-hydralazine 20-30 7.51 tab 3 times daily -Continue to monitor blood pressures per protocol -Last blood pressure reading was 130/75   Morbid Obesity -Complicates overall prognosis and care -While there is significant fluid contribution the patient would benefit from further weight loss and will need outpatient PCP follow-up -Estimated body mass index is 45.79 kg/m as calculated from the following:   Height as of this encounter: 5\' 8"  (1.727 m).   Weight as of this encounter: 136.6 kg. -Weight Loss and Dietary Counseling given   DVT prophylaxis: Enoxaparin 40 mg sq q24h Code Status: FULL CODE   Family Communication: No family present at bedside  Disposition Plan: SNF pending placement   Status is: Inpatient  Remains inpatient appropriate because:Unsafe d/c plan, IV treatments appropriate due to intensity of illness or inability to take PO, and Inpatient level of care appropriate due to severity of illness  Dispo: The patient is from: Home              Anticipated d/c is to: SNF              Patient currently is not medically stable to d/c.   Difficult to place patient No  Consultants:  Cardiology General Surgery Gastroenterology  Procedures:  ECHOCARDIOGRAM IMPRESSIONS     1. Left ventricular ejection fraction, by estimation, is 65 to 70%. The  left ventricle has normal function. The left ventricle has no regional  wall motion abnormalities. There is moderate concentric left ventricular  hypertrophy. Left ventricular  diastolic parameters are consistent with Grade II diastolic dysfunction  (pseudonormalization). Elevated left atrial pressure.   2. Left atrial size was moderately dilated.   3. A small pericardial effusion is present. The pericardial effusion is  mostly localized near the right ventricle. There is no evidence of cardiac  tamponade.   4. Disproportionately severe right coronary cusp  calcification is seen.  The aortic valve is tricuspid. There is moderate calcification of the  aortic valve. There is moderate thickening of the aortic valve. Aortic  valve regurgitation is mild. Mild aortic   valve stenosis.   5. Tricuspid regurgitation signal is inadequate for assessing PA  pressure.   6. The inferior vena cava is dilated in size with <50% respiratory  variability, suggesting right atrial pressure of 15 mmHg.   Comparison(s): Prior images reviewed side by side. The left ventricular  function is unchanged. The left ventricular diastolic function is  significantly worse. Filling pressures have increased. The pericardial  effusion is unchanged or  slightly smaller.   FINDINGS   Left Ventricle: Left ventricular ejection fraction, by estimation, is 65  to 70%. The left ventricle has normal function. The left ventricle has no  regional wall motion abnormalities. Definity contrast agent was given IV  to delineate the left ventricular   endocardial borders. The left ventricular internal cavity size was normal  in size. There is moderate concentric left ventricular hypertrophy. Left  ventricular diastolic parameters are consistent with Grade II diastolic  dysfunction (pseudonormalization).   Elevated left atrial pressure.   Right Ventricle: Tricuspid regurgitation signal is inadequate for  assessing PA pressure.   Left Atrium: Left atrial size was moderately dilated.   Right Atrium: Right atrial size was normal in size.   Pericardium: A small pericardial effusion is present. The pericardial  effusion is localized near the right ventricle. There is no evidence of  cardiac tamponade.   Aortic Valve: Disproportionately severe right coronary cusp calcification  is seen. The aortic valve is tricuspid. There is moderate calcification of  the aortic valve. There is moderate thickening of the aortic valve. Aortic  valve regurgitation is mild.  Mild aortic stenosis is present. Aortic valve mean gradient measures 9.0  mmHg. Aortic valve peak gradient measures 16.7 mmHg.   Aorta: The aortic root and ascending aorta are structurally normal, with  no evidence of dilitation.   Venous: The inferior vena cava is dilated in size with less than 50%  respiratory variability, suggesting right atrial pressure of 15 mmHg.   LEFT VENTRICLE  PLAX 2D  LVIDd:         4.90 cm      Diastology  LVIDs:         2.97 cm      LV e' medial:    5.46 cm/s  LV PW:         1.50 cm      LV E/e' medial:  14.5  LV IVS:        1.38 cm      LV e' lateral:   5.86 cm/s                              LV E/e' lateral: 13.5     LV Volumes (MOD)  LV vol d, MOD A2C: 92.2  ml  LV vol d, MOD A4C: 174.0 ml  LV vol s, MOD A2C: 48.3 ml  3D Volume EF:  LV vol s, MOD A4C: 36.9 ml  3D EF:        68 %  LV SV MOD A2C:     43.9 ml  LV EDV:       259 ml  LV SV MOD A4C:     174.0 ml LV ESV:       82 ml  LV SV  MOD BP:      87.6 ml  LV SV:        177 ml   LEFT ATRIUM         Index  LA diam:    4.50 cm 1.78 cm/m   AORTIC VALVE  AV Vmax:           204.50 cm/s  AV Vmean:          137.500 cm/s  AV VTI:            0.393 m  AV Peak Grad:      16.7 mmHg  AV Mean Grad:      9.0 mmHg  LVOT Vmax:         72.60 cm/s  LVOT Vmean:        47.600 cm/s  LVOT VTI:          0.163 m  LVOT/AV VTI ratio: 0.41   MITRAL VALVE  MV Area (PHT): 2.93 cm    SHUNTS  MV Decel Time: 259 msec    Systemic VTI: 0.16 m  MV E velocity: 79.00 cm/s  MV A velocity: 83.60 cm/s  MV E/A ratio:  0.94   Antimicrobials:  Anti-infectives (From admission, onward)    None        Subjective: Seen and examined at bedside and is just about to eat his breakfast and was little agitated that I came to see him right before he eats.  No nausea or vomiting.  Felt okay.  States that he slept okay.  Still has some lower extremity swelling.  No other concerns or complaints at this time is stable for discharge once bed is available and insurance authorization is obtained.  Objective: Vitals:   09/19/20 1229 09/19/20 2014 09/20/20 0443 09/20/20 0521  BP: (!) 158/93 (!) 171/91  (!) 143/69  Pulse: 75 75  77  Resp: 18 18  18   Temp: (!) 97.5 F (36.4 C) 98 F (36.7 C)  98.4 F (36.9 C)  TempSrc: Oral Oral  Oral  SpO2: 95% 100%  100%  Weight:   (!) 136.6 kg   Height:        Intake/Output Summary (Last 24 hours) at 09/20/2020 0817 Last data filed at 09/20/2020 3500 Gross per 24 hour  Intake 680 ml  Output 2400 ml  Net -1720 ml   Filed Weights   09/18/20 0500 09/19/20 0600 09/20/20 0443  Weight: (!) 138.6 kg 133.7 kg (!) 136.6 kg   Examination: Physical Exam:  Constitutional: WN/WD morbidly obese  African-American male currently in NAD and appears a little agitated but appears uncomfortable Eyes: Lids and conjunctivae normal, sclerae anicteric  ENMT: External Ears, Nose appear normal.  Poor dentition Neck: Appears normal, supple, no cervical masses, normal ROM, no appreciable thyromegaly; mild JVD Respiratory: Diminished to auscultation bilaterally with coarse breath sounds, no wheezing, rales, rhonchi or crackles. Normal respiratory effort and patient is not tachypenic. No accessory muscle use.  Unlabored breathing Cardiovascular: RRR, no murmurs / rubs / gallops. S1 and S2 auscultated.  1-2+ lower extremity edema Abdomen: Soft, non-tender, non-distended. Bowel sounds positive.  GU: Deferred. Musculoskeletal: No clubbing / cyanosis of digits/nails. No joint deformity upper and lower extremities.  Skin: No rashes, lesions, ulcers on limited skin evaluation. No induration; Warm and dry.  Neurologic: CN 2-12 grossly intact with no focal deficits. Romberg sign and cerebellar reflexes not assessed.  Psychiatric: Normal judgment and insight. Alert and oriented x 3.  A little agitated mood and  appropriate affect.   Data Reviewed: I have personally reviewed following labs and imaging studies  CBC: Recent Labs  Lab 09/18/20 1018  WBC 3.6*  HGB 8.9*  HCT 28.5*  MCV 80.3  PLT 098   Basic Metabolic Panel: Recent Labs  Lab 09/14/20 0656 09/16/20 0843 09/18/20 1018  NA 139 139 138  K 3.8 3.4* 3.2*  CL 103 105 103  CO2 25 25 27   GLUCOSE 190* 169* 229*  BUN 40* 45* 46*  CREATININE 1.54* 1.54* 1.67*  CALCIUM 9.2 8.9 8.7*   GFR: Estimated Creatinine Clearance: 64.5 mL/min (A) (by C-G formula based on SCr of 1.67 mg/dL (H)). Liver Function Tests: No results for input(s): AST, ALT, ALKPHOS, BILITOT, PROT, ALBUMIN in the last 168 hours. No results for input(s): LIPASE, AMYLASE in the last 168 hours. No results for input(s): AMMONIA in the last 168 hours. Coagulation Profile: No  results for input(s): INR, PROTIME in the last 168 hours. Cardiac Enzymes: No results for input(s): CKTOTAL, CKMB, CKMBINDEX, TROPONINI in the last 168 hours. BNP (last 3 results) No results for input(s): PROBNP in the last 8760 hours. HbA1C: No results for input(s): HGBA1C in the last 72 hours. CBG: Recent Labs  Lab 09/19/20 0740 09/19/20 1203 09/19/20 1630 09/19/20 2013 09/20/20 0712  GLUCAP 154* 206* 185* 170* 143*   Lipid Profile: No results for input(s): CHOL, HDL, LDLCALC, TRIG, CHOLHDL, LDLDIRECT in the last 72 hours. Thyroid Function Tests: No results for input(s): TSH, T4TOTAL, FREET4, T3FREE, THYROIDAB in the last 72 hours. Anemia Panel: No results for input(s): VITAMINB12, FOLATE, FERRITIN, TIBC, IRON, RETICCTPCT in the last 72 hours. Sepsis Labs: No results for input(s): PROCALCITON, LATICACIDVEN in the last 168 hours.  No results found for this or any previous visit (from the past 240 hour(s)).   RN Pressure Injury Documentation:     Estimated body mass index is 45.79 kg/m as calculated from the following:   Height as of this encounter: 5\' 8"  (1.727 m).   Weight as of this encounter: 136.6 kg.  Malnutrition Type: Malnutrition Characteristics: Nutrition Interventions:    Radiology Studies: No results found.  Scheduled Meds:  amLODipine  10 mg Oral Daily   aspirin  81 mg Oral Daily   atorvastatin  80 mg Oral QHS   carvedilol  12.5 mg Oral BID WC   empagliflozin  10 mg Oral Daily   enoxaparin (LOVENOX) injection  40 mg Subcutaneous Q24H   ferrous sulfate  325 mg Oral Daily   insulin aspart  0-9 Units Subcutaneous TID WC   insulin glargine  15 Units Subcutaneous QHS   isosorbide-hydrALAZINE  1 tablet Oral TID   pantoprazole  40 mg Oral Daily   potassium chloride  20 mEq Oral BID   pregabalin  75 mg Oral BID   sodium chloride flush  3 mL Intravenous Q12H   tamsulosin  0.8 mg Oral Daily   torsemide  40 mg Oral BID   Continuous Infusions:  sodium  chloride 250 mL (08/30/20 1403)    LOS: 29 days   Kerney Elbe, DO [facility] PAGER is on AMION  If 7PM-7AM, please contact night-coverage [email or web address]

## 2020-09-21 DIAGNOSIS — I639 Cerebral infarction, unspecified: Secondary | ICD-10-CM

## 2020-09-21 DIAGNOSIS — E1149 Type 2 diabetes mellitus with other diabetic neurological complication: Secondary | ICD-10-CM

## 2020-09-21 LAB — GLUCOSE, CAPILLARY
Glucose-Capillary: 148 mg/dL — ABNORMAL HIGH (ref 70–99)
Glucose-Capillary: 165 mg/dL — ABNORMAL HIGH (ref 70–99)
Glucose-Capillary: 154 mg/dL — ABNORMAL HIGH (ref 70–99)
Glucose-Capillary: 144 mg/dL — ABNORMAL HIGH (ref 70–99)

## 2020-09-21 NOTE — TOC Progression Note (Signed)
Transition of Care [facility]) - Progression Note    Patient Details  Name: [name] MRN: [id number] Date of Birth: 1960-11-01  Transition of [facility]) CM/SW Contact  Ross Ludwig, [place] Phone Number: 09/21/2020, 4:17 PM  Clinical Narrative:     CSW was informed by RN case manager Juliann Pulse that patient has won his appeal and has been approved for SNF from [facility].  Patient has a bed available at [facility] for tomorrow pending negative Covid test.  CSW updated bedside nurse and attending physician.    Expected Discharge Plan: [place] Barriers to Discharge:  (awaiting outcome from expidited appeal initiated on 7/26-7/29 for outcome)  Expected Discharge Plan and Services Expected Discharge Plan: [place]   Discharge Planning Services: CM Consult Post Acute Care Choice: [place] arrangements for the past 2 months: [place] Expected Discharge Date: 09/08/20                                     Social Determinants of Health (SDOH) Interventions    Readmission Risk Interventions Readmission Risk Prevention Plan 01/12/2020  Transportation Screening Complete  Palliative Care Screening Not Applicable  Some recent data might be hidden

## 2020-09-21 NOTE — Plan of Care (Signed)
  Problem: Health Behavior/Discharge Planning: Goal: Ability to manage health-related needs will improve Outcome: Progressing   Problem: Clinical Measurements: Goal: Will remain free from infection Outcome: Progressing Goal: Cardiovascular complication will be avoided Outcome: Progressing   Problem: Activity: Goal: Risk for activity intolerance will decrease Outcome: Progressing   Problem: Education: Goal: Ability to demonstrate management of disease process will improve Outcome: Progressing Goal: Ability to verbalize understanding of medication therapies will improve Outcome: Progressing

## 2020-09-21 NOTE — TOC Progression Note (Addendum)
Transition of Care [facility]) - Progression Note    Patient Details  Name: [name] MRN: [id number] Date of Birth: June 18, 1960  Transition of Care [facility]) CM/SW Contact  Elveria Lauderbaugh, Juliann Pulse, RN Phone Number: 09/21/2020, 9:08 AM  Clinical Narrative: Awaiting outcome from [facility] expidited appeal due by 7/29-case#[id number] contact person:Lori Wells/tel#[phone](M-F 8a-5p)/fax#[phone]. Main [employer] tel#1 [phone].member id#[id number] [phone].  Patient agree to return to [facility] if denied SNF for [place] for [place]-await response. 11:48a-TC to [place] will check into patient's medicaid policy if can use for LTC @ [place]-await outcome.    Expected Discharge Plan: [place] Barriers to Discharge:  (awaiting outcome from expidited appeal initiated on 7/26-7/29 for outcome)  Expected Discharge Plan and Services Expected Discharge Plan: [place]   Discharge Planning Services: CM Consult Post Acute Care Choice: [place] arrangements for the past 2 months: [place] Expected Discharge Date: 09/08/20                                     Social Determinants of Health (SDOH) Interventions    Readmission Risk Interventions Readmission Risk Prevention Plan 01/12/2020  Transportation Screening Complete  Palliative Care Screening Not Applicable  Some recent data might be hidden

## 2020-09-21 NOTE — Progress Notes (Signed)
PROGRESS NOTE    [name]  AQT:[id number] DOB: April 11, 1960 DOA: 08/21/2020 PCP: [name], NP   Brief Narrative:  The patient is a 60 year old African-American male with a past medical history significant for chronic diastolic CHF, diabetes mellitus type 2, morbid obesity, hypertension as well as other comorbidities who presented with a chief complaint of chest pain or shortness of breath associated swelling.  He was found to have acute on chronic diastolic CHF with anasarca and placed on IV Lasix 60 mg and was admitted.  Subsequently had an AKI and creatinine had improved with diuresis.  He then also developed a colonic ileus that improved with conservative management.  He was seen by PT OT who advised skilled nursing facility.  Since his fluid status is improved with IV Lasix we will continue his home CHF medications including torsemide.  Also will continue with fluid and salt restriction.  Currently medically stable to be discharged to skilled nursing facility once bed is available.  Given his ongoing persistent swelling his torsemide was increased to 40 mg p.o. twice daily and he continues to have good diuresis with his weights improving. 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  torsemide and is now on p.o. torsemide 40 mg twice daily and will continue -Strict I's and O's and daily weights; patient is -21.517 L since admission and weight is down to 292 pounds today from 330 on admission -BMP is now 122.1 -Continue with carvedilol 12.5 hours p.o. twice daily and with isosorbide-hydralazine 20-37.5 mg 1 tab p.o. 3 times daily; replete with potassium chloride 20 mEQ twice daily -Currently holding his Entresto due to an AKI but likely can be resumed at discharge -Continue to monitor volume status carefully and continue to monitor for signs and symptoms of volume overload -Repeat chest x-ray in a.m.  GERD -Continue with PPI with pantoprazole 40 mg p.o. daily  Hypokalemia -In the setting of diuresis -Patient's K+ was 3.4 yesterday  -replete with p.o. potassium chloride 20 mEq twice daily x2 doses and will give an additional 40 mEq yesterday -Magnesium levels 1.8 -continue monitor and replete as necessary  -Repeat CMP intermittently   Normocytic Anemia/Anemia of Chronic Disease/Iron Deficiency Anemia  -Patient's Hgb/Hct went from 8.9/28.5 -> 9.5/31.2 yesterday  -Checked Anemia Panel a month ago at 08/23/2020 and showed an iron level of 30, U IBC of 216, TIBC of 246 saturation ratios of 12%, ferritin level 72, folate of 7.2 -Continue with Ferrous Sulfate 325 mg p.o. daily -Continue to Monitor for S/Sx of Bleeding; No overt bleeding noted -Repeat CBC intermittently but will check in the AM   AKI on CKD stage IIIa Cardiorenal syndrome -Initial Cr 2.0, baseline 1.3-1.6 prior -Has overall improved with diuresis -Creatinine now back to baseline; BUN/Cr has gone from 46/1.67 -> 44/1.58  on last check  -Avoid further nephrotoxic medications, contrast dyes, hypotension and renally dose medications -Repeat CMP intermittently and in the AM   Ileus -KUB ON 7/5 showing ileus, patient has responded to bowel regimen -CT abdomen pelvis 7/7 no evidence of bowel obstruction or  segment volvulus managed conservatively and now resolved. -Bowel regimen has now been discontinued given his diarrhea   Diarrhea -Improving.   -No signs of infection so doubt C. difficile.  He has already been tested negative recently for GI panel and C. difficile.   -Continue loperamide 2 mg p.o. daily PRN .   RUE swelling  -Negative for DVT on duplex   Hx of CAD -Hx of OM1 subtotal occlusion per [facility] 06/2019 -C/w ASA 81 mg po Daily, Atorvastatin 80 mg po qHS, and Carvedilol 12.5 mg po BID    Hx of Stroke  -History with right-sided weakness .   -Stable, C/w ASA 81 mg po Daily, Atorvastatin 80 mg po qHS  Pericardial Effusion  -Is stable with no signs of cardiac tamponade   Depression  -Mood stable   BPH -C/w Tamsulosin 0.8 mg po Daily    Type 2 Diabetes c/b Peripheral Neuropathy -HbA1c poorly controled 9.1.   -Home regimen: insulin glargine 30u qhs -Still hyperglycemic despite of receiving 10 units of Lantus last night, will increase to 15 units tonight.  -Continue SSI. -C/w pregabalin -C/w empagliflozin -CBG's ranging from 165-219   Debility/physical deconditioning: -Continue PT OT. Insurance authorization is pending   Leukopenia -Mild -The patient WBC went from 3.6 now 3.9 on last check  -Continue to monitor and trend and repeat CBC in the AM    Essential Hypertension -BP is moderately well controlled -C/w Home Regimen with amlodipine 10 mg p.o. daily, carvedilol 12.5 mg p.o. twice daily, isosorbide-hydralazine 20-30 7.51 tab 3 times daily -Continue to monitor blood pressures per protocol -Last blood pressure reading was a little elevated and 169/98   Morbid Obesity -Complicates overall prognosis and care -While there is significant fluid contribution the patient would benefit from further weight loss and will need outpatient PCP follow-up -Estimated body mass index is 44.42 kg/m as calculated from the following:   Height as of this encounter: 5\' 8"  (1.727 m).    Weight as of this encounter: 132.5 kg. -Weight Loss and Dietary Counseling given   DVT prophylaxis: Enoxaparin 40 mg sq q24h Code Status: FULL CODE  Family Communication: No family present at bedside  Disposition Plan: SNF pending placement   Status is: Inpatient  Remains inpatient appropriate because:Unsafe d/c plan, IV treatments appropriate due to intensity of illness or inability to take PO, and Inpatient level of care appropriate due to severity of illness  Dispo: The patient is from: Home              Anticipated d/c is to: SNF              Patient currently is not medically stable to d/c.   Difficult to place patient No  Consultants:  Cardiology General Surgery Gastroenterology  Procedures:  ECHOCARDIOGRAM IMPRESSIONS     1. Left ventricular ejection fraction, by estimation, is 65 to 70%. The  left ventricle has normal function. The left ventricle has no regional  wall motion abnormalities. There is moderate concentric left ventricular  hypertrophy. Left ventricular  diastolic parameters are consistent with Grade II diastolic dysfunction  (pseudonormalization). Elevated left atrial pressure.   2. Left atrial size was moderately dilated.   3. A small pericardial effusion  is present. The pericardial effusion is  mostly localized near the right ventricle. There is no evidence of cardiac  tamponade.   4. Disproportionately severe right coronary cusp calcification is seen.  The aortic valve is tricuspid. There is moderate calcification of the  aortic valve. There is moderate thickening of the aortic valve. Aortic  valve regurgitation is mild. Mild aortic   valve stenosis.   5. Tricuspid regurgitation signal is inadequate for assessing PA  pressure.   6. The inferior vena cava is dilated in size with <50% respiratory  variability, suggesting right atrial pressure of 15 mmHg.   Comparison(s): Prior images reviewed side by side. The left ventricular  function is  unchanged. The left ventricular diastolic function is  significantly worse. Filling pressures have increased. The pericardial  effusion is unchanged or slightly smaller.   FINDINGS   Left Ventricle: Left ventricular ejection fraction, by estimation, is 65  to 70%. The left ventricle has normal function. The left ventricle has no  regional wall motion abnormalities. Definity contrast agent was given IV  to delineate the left ventricular   endocardial borders. The left ventricular internal cavity size was normal  in size. There is moderate concentric left ventricular hypertrophy. Left  ventricular diastolic parameters are consistent with Grade II diastolic  dysfunction (pseudonormalization).   Elevated left atrial pressure.   Right Ventricle: Tricuspid regurgitation signal is inadequate for  assessing PA pressure.   Left Atrium: Left atrial size was moderately dilated.   Right Atrium: Right atrial size was normal in size.   Pericardium: A small pericardial effusion is present. The pericardial  effusion is localized near the right ventricle. There is no evidence of  cardiac tamponade.   Aortic Valve: Disproportionately severe right coronary cusp calcification  is seen. The aortic valve is tricuspid. There is moderate calcification of  the aortic valve. There is moderate thickening of the aortic valve. Aortic  valve regurgitation is mild.  Mild aortic stenosis is present. Aortic valve mean gradient measures 9.0  mmHg. Aortic valve peak gradient measures 16.7 mmHg.   Aorta: The aortic root and ascending aorta are structurally normal, with  no evidence of dilitation.   Venous: The inferior vena cava is dilated in size with less than 50%  respiratory variability, suggesting right atrial pressure of 15 mmHg.   LEFT VENTRICLE  PLAX 2D  LVIDd:         4.90 cm      Diastology  LVIDs:         2.97 cm      LV e' medial:    5.46 cm/s  LV PW:         1.50 cm      LV E/e' medial:  14.5   LV IVS:        1.38 cm      LV e' lateral:   5.86 cm/s                              LV E/e' lateral: 13.5     LV Volumes (MOD)  LV vol d, MOD A2C: 92.2 ml  LV vol d, MOD A4C: 174.0 ml  LV vol s, MOD A2C: 48.3 ml  3D Volume EF:  LV vol s, MOD A4C: 36.9 ml  3D EF:        68 %  LV SV MOD A2C:     43.9 ml  LV EDV:  259 ml  LV SV MOD A4C:     174.0 ml LV ESV:       82 ml  LV SV MOD BP:      87.6 ml  LV SV:        177 ml   LEFT ATRIUM         Index  LA diam:    4.50 cm 1.78 cm/m   AORTIC VALVE  AV Vmax:           204.50 cm/s  AV Vmean:          137.500 cm/s  AV VTI:            0.393 m  AV Peak Grad:      16.7 mmHg  AV Mean Grad:      9.0 mmHg  LVOT Vmax:         72.60 cm/s  LVOT Vmean:        47.600 cm/s  LVOT VTI:          0.163 m  LVOT/AV VTI ratio: 0.41   MITRAL VALVE  MV Area (PHT): 2.93 cm    SHUNTS  MV Decel Time: 259 msec    Systemic VTI: 0.16 m  MV E velocity: 79.00 cm/s  MV A velocity: 83.60 cm/s  MV E/A ratio:  0.94   Antimicrobials:  Anti-infectives (From admission, onward)    None        Subjective: Seen and examined at bedside had just had a very large bowel movement.  No nausea or vomiting.  Still gets a little dyspneic with any activity.  Denies any other concerns or complaints at this time.  Still awaiting SNF placement and is authorization is still pending appeal.   Objective: Vitals:   09/21/20 0322 09/21/20 0500 09/21/20 0938 09/21/20 0938  BP: (!) 149/88  (!) 169/98 (!) 169/98  Pulse: 75  76 76  Resp: 20   16  Temp: 98.2 F (36.8 C)   98.3 F (36.8 C)  TempSrc: Oral   Oral  SpO2: 100%   99%  Weight:  132.5 kg    Height:        Intake/Output Summary (Last 24 hours) at 09/21/2020 1309 Last data filed at 09/21/2020 0400 Gross per 24 hour  Intake 326 ml  Output 1400 ml  Net -1074 ml    Filed Weights   09/19/20 0600 09/20/20 0443 09/21/20 0500  Weight: 133.7 kg (!) 136.6 kg 132.5 kg   Examination: Physical Exam:  Constitutional:  WN/WD morbidly obese AAM in NAD and appears calm  Eyes: Lids and conjunctivae normal, sclerae anicteric  ENMT: External Ears, Nose appear normal. Grossly normal hearing. Poor Dentition  Neck: Appears normal, supple, no cervical masses, normal ROM, no appreciable thyromegaly Respiratory: Diminished to auscultation bilaterally, no wheezing, rales, rhonchi or crackles. Normal respiratory effort and patient is not tachypenic. No accessory muscle use. Unlabored breathing  Cardiovascular: RRR, no murmurs / rubs / gallops. S1 and S2 auscultated. 1+ LE Edema with Chronic Lymphedema Abdomen: Soft, non-tender, Distended. Bowel sounds positive.  GU: Deferred. Musculoskeletal: No clubbing / cyanosis of digits/nails. No joint deformity upper and lower extremities.  Skin: No rashes, lesions, ulcers on a limited skin evaluation. No induration; Warm and dry.  Neurologic: CN 2-12 grossly intact with no focal deficits. Romberg sign cerebellar and reflexes not assessed.  Psychiatric: Normal judgment and insight. Alert and oriented x 3. Normal mood and appropriate affect.   Data Reviewed: I have personally  reviewed following labs and imaging studies  CBC: Recent Labs  Lab 09/18/20 1018 09/20/20 0833  WBC 3.6* 3.9*  NEUTROABS  --  1.4*  HGB 8.9* 9.5*  HCT 28.5* 31.2*  MCV 80.3 81.3  PLT 186 818    Basic Metabolic Panel: Recent Labs  Lab 09/16/20 0843 09/18/20 1018 09/20/20 0833  NA 139 138 140  K 3.4* 3.2* 3.4*  CL 105 103 99  CO2 25 27 30   GLUCOSE 169* 229* 147*  BUN 45* 46* 44*  CREATININE 1.54* 1.67* 1.58*  CALCIUM 8.9 8.7* 8.9  MG  --   --  1.8  PHOS  --   --  4.9*    GFR: Estimated Creatinine Clearance: 66.9 mL/min (A) (by C-G formula based on SCr of 1.58 mg/dL (H)). Liver Function Tests: Recent Labs  Lab 09/20/20 0833  AST 20  ALT 27  ALKPHOS 76  BILITOT 0.4  PROT 6.7  ALBUMIN 2.8*   No results for input(s): LIPASE, AMYLASE in the last 168 hours. No results for input(s):  AMMONIA in the last 168 hours. Coagulation Profile: No results for input(s): INR, PROTIME in the last 168 hours. Cardiac Enzymes: No results for input(s): CKTOTAL, CKMB, CKMBINDEX, TROPONINI in the last 168 hours. BNP (last 3 results) No results for input(s): PROBNP in the last 8760 hours. HbA1C: No results for input(s): HGBA1C in the last 72 hours. CBG: Recent Labs  Lab 09/20/20 1114 09/20/20 1611 09/20/20 1948 09/21/20 0711 09/21/20 1131  GLUCAP 219* 166* 171* 148* 165*    Lipid Profile: No results for input(s): CHOL, HDL, LDLCALC, TRIG, CHOLHDL, LDLDIRECT in the last 72 hours. Thyroid Function Tests: No results for input(s): TSH, T4TOTAL, FREET4, T3FREE, THYROIDAB in the last 72 hours. Anemia Panel: No results for input(s): VITAMINB12, FOLATE, FERRITIN, TIBC, IRON, RETICCTPCT in the last 72 hours. Sepsis Labs: No results for input(s): PROCALCITON, LATICACIDVEN in the last 168 hours.  No results found for this or any previous visit (from the past 240 hour(s)).   RN Pressure Injury Documentation:     Estimated body mass index is 44.42 kg/m as calculated from the following:   Height as of this encounter: 5\' 8"  (1.727 m).   Weight as of this encounter: 132.5 kg.  Malnutrition Type: Malnutrition Characteristics: Nutrition Interventions:    Radiology Studies: No results found.  Scheduled Meds:  amLODipine  10 mg Oral Daily   aspirin  81 mg Oral Daily   atorvastatin  80 mg Oral QHS   carvedilol  12.5 mg Oral BID WC   empagliflozin  10 mg Oral Daily   enoxaparin (LOVENOX) injection  40 mg Subcutaneous Q24H   ferrous sulfate  325 mg Oral Daily   insulin aspart  0-9 Units Subcutaneous TID WC   insulin glargine  15 Units Subcutaneous QHS   isosorbide-hydrALAZINE  1 tablet Oral TID   pantoprazole  40 mg Oral Daily   potassium chloride  20 mEq Oral BID   pregabalin  75 mg Oral BID   sodium chloride flush  3 mL Intravenous Q12H   tamsulosin  0.8 mg Oral Daily    torsemide  40 mg Oral BID   Continuous Infusions:  sodium chloride 250 mL (08/30/20 1403)    LOS: 30 days   Kerney Elbe, DO [facility] PAGER is on AMION  If 7PM-7AM, please contact night-coverage [email or web address]

## 2020-09-21 NOTE — Plan of Care (Signed)
  Problem: Clinical Measurements: Goal: Will remain free from infection Outcome: Progressing   Problem: Elimination: Goal: Will not experience complications related to urinary retention 09/21/2020 0052 by Carmine Savoy, RN Outcome: Progressing 09/21/2020 0051 by Carmine Savoy, RN Outcome: Not Progressing   Problem: Safety: Goal: Ability to remain free from injury will improve Outcome: Progressing   Problem: Education: Goal: Ability to demonstrate management of disease process will improve Outcome: Progressing   Problem: Education: Goal: Knowledge of General Education information will improve Description: Including pain rating scale, medication(s)/side effects and non-pharmacologic comfort measures Outcome: Not Progressing   Problem: Health Behavior/Discharge Planning: Goal: Ability to manage health-related needs will improve Outcome: Not Progressing   Problem: Clinical Measurements: Goal: Ability to maintain clinical measurements within normal limits will improve Outcome: Not Progressing   Problem: Activity: Goal: Risk for activity intolerance will decrease Outcome: Not Progressing   Problem: Elimination: Goal: Will not experience complications related to bowel motility Outcome: Not Progressing   Problem: Skin Integrity: Goal: Risk for impaired skin integrity will decrease Outcome: Not Progressing

## 2020-09-21 NOTE — Progress Notes (Addendum)
Occupational Therapy Treatment Patient Details Name: [name] MRN: [id number] DOB: October 20, 1960 Today's Date: 09/21/2020    History of present illness 60 year old male with chronic diastolic CHF, diabetes, morbid obesity, history of presented with chest pain shortness of breath swelling.  He was found to have acute on chronic diastolic CHF with anasarca, was placed on IV Lasix 60 and was admitted.  He also had AKI and creatinine improved with IV diuresis.  He developed colonic ileus that improved with conservative management.  He was seen by PT OT and has advised skilled nursing facility.  Patient fluid status improved with IV Lasix.We will continue with home chf meds including torsemide. Continue fluid restriction salt restriction.He is being discharged to skilled nursing facility pending bed available.   OT comments  Treatment focused on functional mobility in order to advance to OOB activities. Patient exhibited improved ability to assist with supine to assist but still unable to stand today with +2 assistance and use of EVA walker. Patient unable to tolerate position of feet under/behind knees in order to power up. Patient required total assist to return to supine. Patient is able to sit edge of bed and perform UB ADLs and has met those OT goals. Patient's LB and toileting goals have been down graded and focus of therapy is to improve impairments to reduce caregiver burden and focus on compensatory strategies.   Follow Up Recommendations  SNF    Equipment Recommendations  Other (comment) (Defer to next venue)    Recommendations for Other Services      Precautions / Restrictions Precautions Precautions: Fall Precaution Comments: edematous LE, scrotum Restrictions Weight Bearing Restrictions: No       Mobility Bed Mobility Overal bed mobility: Needs Assistance       Supine to sit: HOB elevated;Mod assist Sit to supine: Total assist;+2 for physical assistance   General bed  mobility comments: Patient requested head of bed to be at approx 60 deg prior to attempting transfer to side of bed. Patient able to bring legs approx 60% off the bed and shifting upper body with use of bed rails. Needed mod assist of therapist to pivot hips with use of bed pad and pull trunk forward. Patient able to scoot to edge of bed.    Transfers Overall transfer level: Needs assistance               General transfer comment: Used EVA walker to attempt sit to stand with elevated bed height and +2 assistance. Able to get patient positioned well on eva despite body habitus but patient unable to power up - patient reporting pain in LEs and "knee buckling" and telling therapist to help kick his legs forward. Patient ended up precariously on the edge of the bed and unabel to assist with scooting himself back. Total assist to return to supine.    Balance Overall balance assessment: Needs assistance Sitting-balance support: Feet supported;Bilateral upper extremity supported Sitting balance-Leahy Scale: Fair Sitting balance - Comments: Tolerated edge of bed sitting - tends to prop with upper extremities                                   ADL either performed or assessed with clinical judgement   ADL  Vision Patient Visual Report: No change from baseline Vision Assessment?: No apparent visual deficits   Perception     Praxis      Cognition Arousal/Alertness: Awake/alert Behavior During Therapy: WFL for tasks assessed/performed Overall Cognitive Status: Within Functional Limits for tasks assessed                                          Exercises     Shoulder Instructions       General Comments      Pertinent Vitals/ Pain       Pain Assessment: No/denies pain  Home Living                                          Prior Functioning/Environment               Frequency  Min 2X/week        Progress Toward Goals  OT Goals(current goals can now be found in the care plan section)  Progress towards OT goals: Goals drowngraded-see care plan  Acute Rehab OT Goals Patient Stated Goal: To not give up on walking OT Goal Formulation: With patient Time For Goal Achievement: 10/05/20 Potential to Achieve Goals: [place] Discharge plan remains appropriate    Co-evaluation          OT goals addressed during session: Other (comment) (functional mobility)      AM-PAC OT "6 Clicks" Daily Activity     Outcome Measure   Help from another person eating meals?: A Little Help from another person taking care of personal grooming?: A Little Help from another person toileting, which includes using toliet, bedpan, or urinal?: Total Help from another person bathing (including washing, rinsing, drying)?: A Lot Help from another person to put on and taking off regular upper body clothing?: A Little Help from another person to put on and taking off regular lower body clothing?: Total 6 Click Score: 13    End of Session Equipment Utilized During Treatment: Other (comment) (EVA walker)  OT Visit Diagnosis: Other abnormalities of gait and mobility (R26.89);Muscle weakness (generalized) (M62.81);Hemiplegia and hemiparesis;Pain Hemiplegia - Right/Left: Right Hemiplegia - dominant/non-dominant: Dominant Hemiplegia - caused by: Cerebral infarction   Activity Tolerance     Patient Left     Nurse Communication  (okay to see per RN)        Time: 1330-1400 OT Time Calculation (min): 30 min  Charges: OT General Charges $OT Visit: 1 Visit OT Treatments $Therapeutic Activity: 23-37 mins  Khushbu Pippen, OTR/L [place]  Office [phone] Pager: [place] 09/21/2020, 3:30 PM

## 2020-09-22 DIAGNOSIS — R4182 Altered mental status, unspecified: Secondary | ICD-10-CM | POA: Diagnosis not present

## 2020-09-22 DIAGNOSIS — B961 Klebsiella pneumoniae [K. pneumoniae] as the cause of diseases classified elsewhere: Secondary | ICD-10-CM | POA: Diagnosis not present

## 2020-09-22 DIAGNOSIS — I110 Hypertensive heart disease with heart failure: Secondary | ICD-10-CM | POA: Diagnosis not present

## 2020-09-22 DIAGNOSIS — I5033 Acute on chronic diastolic (congestive) heart failure: Secondary | ICD-10-CM | POA: Diagnosis not present

## 2020-09-22 DIAGNOSIS — I313 Pericardial effusion (noninflammatory): Secondary | ICD-10-CM | POA: Diagnosis not present

## 2020-09-22 DIAGNOSIS — R140 Abdominal distension (gaseous): Secondary | ICD-10-CM | POA: Diagnosis not present

## 2020-09-22 DIAGNOSIS — I639 Cerebral infarction, unspecified: Secondary | ICD-10-CM | POA: Diagnosis not present

## 2020-09-22 DIAGNOSIS — E782 Mixed hyperlipidemia: Secondary | ICD-10-CM | POA: Diagnosis not present

## 2020-09-22 DIAGNOSIS — E0800 Diabetes mellitus due to underlying condition with hyperosmolarity without nonketotic hyperglycemic-hyperosmolar coma (NKHHC): Secondary | ICD-10-CM | POA: Diagnosis not present

## 2020-09-22 DIAGNOSIS — N179 Acute kidney failure, unspecified: Secondary | ICD-10-CM | POA: Diagnosis not present

## 2020-09-22 DIAGNOSIS — R296 Repeated falls: Secondary | ICD-10-CM | POA: Diagnosis not present

## 2020-09-22 DIAGNOSIS — M19031 Primary osteoarthritis, right wrist: Secondary | ICD-10-CM | POA: Diagnosis not present

## 2020-09-22 DIAGNOSIS — I2510 Atherosclerotic heart disease of native coronary artery without angina pectoris: Secondary | ICD-10-CM | POA: Diagnosis not present

## 2020-09-22 DIAGNOSIS — E1165 Type 2 diabetes mellitus with hyperglycemia: Secondary | ICD-10-CM | POA: Diagnosis not present

## 2020-09-22 DIAGNOSIS — E119 Type 2 diabetes mellitus without complications: Secondary | ICD-10-CM | POA: Diagnosis not present

## 2020-09-22 DIAGNOSIS — R69 Illness, unspecified: Secondary | ICD-10-CM | POA: Diagnosis not present

## 2020-09-22 DIAGNOSIS — Z7401 Bed confinement status: Secondary | ICD-10-CM | POA: Diagnosis not present

## 2020-09-22 DIAGNOSIS — N1831 Chronic kidney disease, stage 3a: Secondary | ICD-10-CM | POA: Diagnosis not present

## 2020-09-22 DIAGNOSIS — D649 Anemia, unspecified: Secondary | ICD-10-CM | POA: Diagnosis not present

## 2020-09-22 DIAGNOSIS — J9811 Atelectasis: Secondary | ICD-10-CM | POA: Diagnosis not present

## 2020-09-22 DIAGNOSIS — R609 Edema, unspecified: Secondary | ICD-10-CM | POA: Diagnosis not present

## 2020-09-22 DIAGNOSIS — M19041 Primary osteoarthritis, right hand: Secondary | ICD-10-CM | POA: Diagnosis not present

## 2020-09-22 DIAGNOSIS — I5030 Unspecified diastolic (congestive) heart failure: Secondary | ICD-10-CM | POA: Diagnosis not present

## 2020-09-22 DIAGNOSIS — I10 Essential (primary) hypertension: Secondary | ICD-10-CM | POA: Diagnosis not present

## 2020-09-22 DIAGNOSIS — K219 Gastro-esophageal reflux disease without esophagitis: Secondary | ICD-10-CM | POA: Diagnosis not present

## 2020-09-22 DIAGNOSIS — D509 Iron deficiency anemia, unspecified: Secondary | ICD-10-CM | POA: Diagnosis not present

## 2020-09-22 DIAGNOSIS — R600 Localized edema: Secondary | ICD-10-CM | POA: Diagnosis not present

## 2020-09-22 DIAGNOSIS — M79641 Pain in right hand: Secondary | ICD-10-CM | POA: Diagnosis not present

## 2020-09-22 DIAGNOSIS — E042 Nontoxic multinodular goiter: Secondary | ICD-10-CM | POA: Diagnosis not present

## 2020-09-22 DIAGNOSIS — E118 Type 2 diabetes mellitus with unspecified complications: Secondary | ICD-10-CM | POA: Diagnosis not present

## 2020-09-22 DIAGNOSIS — R0789 Other chest pain: Secondary | ICD-10-CM | POA: Diagnosis not present

## 2020-09-22 DIAGNOSIS — Z794 Long term (current) use of insulin: Secondary | ICD-10-CM | POA: Diagnosis not present

## 2020-09-22 DIAGNOSIS — E559 Vitamin D deficiency, unspecified: Secondary | ICD-10-CM | POA: Diagnosis not present

## 2020-09-22 DIAGNOSIS — Z743 Need for continuous supervision: Secondary | ICD-10-CM | POA: Diagnosis not present

## 2020-09-22 DIAGNOSIS — E041 Nontoxic single thyroid nodule: Secondary | ICD-10-CM | POA: Diagnosis not present

## 2020-09-22 DIAGNOSIS — R0781 Pleurodynia: Secondary | ICD-10-CM | POA: Diagnosis not present

## 2020-09-22 DIAGNOSIS — M2550 Pain in unspecified joint: Secondary | ICD-10-CM | POA: Diagnosis not present

## 2020-09-22 DIAGNOSIS — I509 Heart failure, unspecified: Secondary | ICD-10-CM | POA: Diagnosis not present

## 2020-09-22 DIAGNOSIS — R079 Chest pain, unspecified: Secondary | ICD-10-CM | POA: Diagnosis present

## 2020-09-22 DIAGNOSIS — Z79899 Other long term (current) drug therapy: Secondary | ICD-10-CM | POA: Diagnosis not present

## 2020-09-22 DIAGNOSIS — D513 Other dietary vitamin B12 deficiency anemia: Secondary | ICD-10-CM | POA: Diagnosis not present

## 2020-09-22 DIAGNOSIS — E876 Hypokalemia: Secondary | ICD-10-CM | POA: Diagnosis not present

## 2020-09-22 DIAGNOSIS — E1149 Type 2 diabetes mellitus with other diabetic neurological complication: Secondary | ICD-10-CM | POA: Diagnosis not present

## 2020-09-22 DIAGNOSIS — N400 Enlarged prostate without lower urinary tract symptoms: Secondary | ICD-10-CM | POA: Diagnosis not present

## 2020-09-22 DIAGNOSIS — J45991 Cough variant asthma: Secondary | ICD-10-CM | POA: Diagnosis not present

## 2020-09-22 DIAGNOSIS — E1140 Type 2 diabetes mellitus with diabetic neuropathy, unspecified: Secondary | ICD-10-CM | POA: Diagnosis not present

## 2020-09-22 DIAGNOSIS — Z7982 Long term (current) use of aspirin: Secondary | ICD-10-CM | POA: Diagnosis not present

## 2020-09-22 DIAGNOSIS — Z87891 Personal history of nicotine dependence: Secondary | ICD-10-CM | POA: Diagnosis not present

## 2020-09-22 LAB — COMPREHENSIVE METABOLIC PANEL
ALT: 25 U/L (ref 0–44)
AST: 21 U/L (ref 15–41)
Albumin: 2.8 g/dL — ABNORMAL LOW (ref 3.5–5.0)
Alkaline Phosphatase: 78 U/L (ref 38–126)
Anion gap: 11 (ref 5–15)
BUN: 43 mg/dL — ABNORMAL HIGH (ref 6–20)
CO2: 29 mmol/L (ref 22–32)
Calcium: 8.6 mg/dL — ABNORMAL LOW (ref 8.9–10.3)
Chloride: 99 mmol/L (ref 98–111)
Creatinine, Ser: 1.63 mg/dL — ABNORMAL HIGH (ref 0.61–1.24)
GFR, Estimated: 48 mL/min — ABNORMAL LOW (ref >60)
Glucose, Bld: 165 mg/dL — ABNORMAL HIGH (ref 70–99)
Potassium: 3.5 mmol/L (ref 3.5–5.1)
Sodium: 139 mmol/L (ref 135–145)
Total Bilirubin: 0.5 mg/dL (ref 0.3–1.2)
Total Protein: 6.7 g/dL (ref 6.5–8.1)

## 2020-09-22 LAB — CBC WITH DIFFERENTIAL/PLATELET
Abs Immature Granulocytes: 0.02 10*3/uL (ref 0.00–0.07)
Basophils Absolute: 0 10*3/uL (ref 0.0–0.1)
Basophils Relative: 1 %
Eosinophils Absolute: 0.6 10*3/uL — ABNORMAL HIGH (ref 0.0–0.5)
Eosinophils Relative: 17 %
HCT: 31.8 % — ABNORMAL LOW (ref 39.0–52.0)
Hemoglobin: 9.6 g/dL — ABNORMAL LOW (ref 13.0–17.0)
Immature Granulocytes: 1 %
Lymphocytes Relative: 30 %
Lymphs Abs: 1.1 10*3/uL (ref 0.7–4.0)
MCH: 24.3 pg — ABNORMAL LOW (ref 26.0–34.0)
MCHC: 30.2 g/dL (ref 30.0–36.0)
MCV: 80.5 fL (ref 80.0–100.0)
Monocytes Absolute: 0.5 10*3/uL (ref 0.1–1.0)
Monocytes Relative: 14 %
Neutro Abs: 1.4 10*3/uL — ABNORMAL LOW (ref 1.7–7.7)
Neutrophils Relative %: 37 %
Platelets: 217 10*3/uL (ref 150–400)
RBC: 3.95 MIL/uL — ABNORMAL LOW (ref 4.22–5.81)
RDW: 16 % — ABNORMAL HIGH (ref 11.5–15.5)
WBC: 3.6 10*3/uL — ABNORMAL LOW (ref 4.0–10.5)
nRBC: 0 % (ref 0.0–0.2)

## 2020-09-22 LAB — SARS CORONAVIRUS 2 BY RT PCR (HOSPITAL ORDER, PERFORMED IN ~~LOC~~ HOSPITAL LAB): SARS Coronavirus 2: NEGATIVE

## 2020-09-22 LAB — MAGNESIUM: Magnesium: 1.8 mg/dL (ref 1.7–2.4)

## 2020-09-22 LAB — GLUCOSE, CAPILLARY
Glucose-Capillary: 154 mg/dL — ABNORMAL HIGH (ref 70–99)
Glucose-Capillary: 138 mg/dL — ABNORMAL HIGH (ref 70–99)

## 2020-09-22 LAB — PHOSPHORUS: Phosphorus: 4.6 mg/dL (ref 2.5–4.6)

## 2020-09-22 MED ORDER — MAGNESIUM SULFATE IN D5W 1-5 GM/100ML-% IV SOLN
1.0000 g | Freq: Once | INTRAVENOUS | Status: AC
  Administered 2020-09-22: 1 g via INTRAVENOUS
  Filled 2020-09-22: qty 100

## 2020-09-22 MED ORDER — EMPAGLIFLOZIN 10 MG PO TABS: 10.0000 mg | ORAL_TABLET | Freq: Every day | ORAL | Status: DC

## 2020-09-22 MED ORDER — POTASSIUM CHLORIDE CRYS ER 20 MEQ PO TBCR
40.0000 meq | EXTENDED_RELEASE_TABLET | Freq: Once | ORAL | Status: DC
  Filled 2020-09-22: qty 2

## 2020-09-22 MED ORDER — FERROUS SULFATE 325 (65 FE) MG PO TABS: 325.0000 mg | ORAL_TABLET | Freq: Every day | ORAL | 3 refills | Status: DC

## 2020-09-22 MED ORDER — INSULIN GLARGINE 100 UNIT/ML ~~LOC~~ SOLN: 15.0000 [IU] | Freq: Every day | SUBCUTANEOUS | 11 refills | Status: DC

## 2020-09-22 MED ORDER — LOPERAMIDE HCL 2 MG PO CAPS: 2.0000 mg | ORAL_CAPSULE | ORAL | 0 refills | Status: DC | PRN

## 2020-09-22 NOTE — Discharge Summary (Addendum)
Physician Discharge Summary  [name] BPZ:[id number] DOB: May 11, 1960 DOA: 08/21/2020  PCP: [name], NP  Admit date: 08/21/2020 Discharge date: 09/22/2020  Admitted From: Home Disposition: SNF  Recommendations for Outpatient Follow-up:  Follow up with PCP in 1-2 weeks Follow up with Cardiology in the outpatient setting in 1-2 weeks Repeat CXR in 3-6 weeks Please obtain CMP/CBC, Mag, Phos in one week Please follow up on the following pending results:  Home Health: No  Equipment/Devices: None  Discharge Condition: Stable CODE STATUS: FULL CODE  Diet recommendation: Heart Healthy Carb Modified Diet with 2000 mL Fluid Restriction   Brief/Interim Summary: The patient is a 60 year old African-American male with a past medical history significant for chronic diastolic CHF, diabetes mellitus type 2, morbid obesity, hypertension as well as other comorbidities who presented with a chief complaint of chest pain or shortness of breath associated swelling.  He was found to have acute on chronic diastolic CHF with anasarca and placed on IV Lasix 60 mg and was admitted.  Subsequently had an AKI and creatinine had improved with diuresis.  He then also developed a colonic ileus that improved with conservative management.  He was seen by PT OT who advised skilled nursing facility.  Since his fluid status is improved with IV Lasix we will continue his home CHF medications including torsemide.  Also will continue with fluid and salt restriction.  Currently medically stable to be discharged to skilled nursing facility once bed is available.  Given his ongoing persistent swelling his torsemide was increased to 40 mg p.o. twice daily and he continues to have good diuresis with his weights improving. Still a little dyspneic with activity but has definitely improved from admission. Will need to follow up with Cardiology as an outpatient.   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 torsemide and is now on p.o. torsemide 40 mg twice daily and will continue -Strict I's and O's and daily weights; patient is -23.073 L since admission and weight is down to 281 pounds today from 330 on admission -BMP is now 122.1 -Continue with carvedilol 12.5 hours p.o. twice daily and with isosorbide-hydralazine 20-37.5 mg 1 tab p.o. 3 times daily; replete with potassium chloride 20 mEQ twice daily -Currently holding his Entresto due to an AKI but will be resumed at discharge -Continue to monitor volume status carefully and continue to monitor for signs and symptoms of volume overload -Repeat chest x-ray as an outpatient    GERD -Continue with PPI with pantoprazole 40 mg p.o. daily   Hypokalemia -In the setting of diuresis -Patient's K+ was 3.5 this AM  -replete with p.o. potassium chloride 20 mEq twice daily x2 doses and will give an additional 40 mEq again -Magnesium levels 1.8 -continue monitor and replete as necessary -Repeat CMP intermittently and within 1 week    Normocytic Anemia/Anemia of  Chronic Disease/Iron Deficiency Anemia -Patient's Hgb/Hct went from 8.9/28.5 -> 9.5/31.2 -> 9.6/31.8 -Checked Anemia Panel a month ago at 08/23/2020 and showed an iron level of 30, U IBC of 216, TIBC of 246 saturation ratios of 12%, ferritin level 72,  folate of 7.2 -Continue with Ferrous Sulfate 325 mg p.o. daily -Continue to Monitor for S/Sx of Bleeding; No overt bleeding noted -Repeat CBC intermittently and repeat within 1 week    AKI on CKD stage IIIa Cardiorenal syndrome -Initial Cr 2.0, baseline 1.3-1.6 prior -Has overall improved with diuresis -Creatinine now back to baseline; BUN/Cr has gone from 46/1.67 -> 44/1.58 -> 43/1.63 -Avoid further nephrotoxic medications, contrast dyes, hypotension and renally dose medications -Repeat CMP intermittently and within 1 week    Ileus -KUB ON 7/5 showing ileus, patient has responded to bowel regimen -CT abdomen pelvis 7/7 no evidence of bowel obstruction or segment volvulus managed conservatively and now resolved. -Bowel regimen has now been discontinued given his diarrhea and improved bowel movements    Diarrhea -Improving.   -No signs of infection so doubt C. difficile.  He has already been tested negative recently for GI panel and C. difficile.   -Continue loperamide 2 mg p.o. daily PRN .   RUE swelling -Negative for DVT on duplex   Hx of CAD -Hx of OM1 subtotal occlusion per [facility] 06/2019 -C/w ASA 81 mg po Daily, Atorvastatin 80 mg po qHS, and Carvedilol 12.5 mg po BID -Resume Entresto at D/C    Hx of Stroke -History with right-sided weakness .   -Stable, C/w ASA 81 mg po Daily, Atorvastatin 80 mg po qHS   Pericardial Effusion -Is stable with no signs of cardiac tamponade -Needs to follow up with Cardiology as an outpatient    Depression -Mood stable -C/w Sertraline 50 mg po Daily    BPH -C/w Tamsulosin 0.8 mg po Daily   Type 2 Diabetes c/b Peripheral Neuropathy -HbA1c poorly controled 9.1.   -Home regimen: insulin glargine 30u qhs -Still hyperglycemic despite of receiving 10 units of Lantus last night, will increase to 15 units tonight.  -Continue SSI. -C/w pregabalin -C/w empagliflozin -CBG's ranging from 144-165   Debility/physical deconditioning: -Continue PT  OT. Insurance authorization is now approved    Leukopenia -Mild -The patient WBC is 3.6 -Continue to monitor and trend and repeat CBC in the AM   Essential Hypertension -BP is moderately well controlled -C/w Home Regimen with amlodipine 10 mg p.o. daily, carvedilol 12.5 mg p.o. twice daily, isosorbide-hydralazine 20-30 7.51 tab 3 times daily -Resume Entresto -Continue to monitor blood pressures per protocol -Last blood pressure reading was 139/77   Morbid Obesity -Complicates overall prognosis and care -While there is significant fluid contribution the patient would benefit from further weight loss and will need outpatient PCP follow-up -Estimated body mass index is 42.84 kg/m as calculated from the following:   Height as of this encounter: 5\' 8"  (1.727 m).   Weight as of this encounter: 127.8 kg. -Weight Loss and Dietary Counseling given   Discharge Instructions  Discharge Instructions     Diet - low sodium heart healthy   Complete by: As directed    Discharge instructions   Complete by: As directed    Please call call MD or return to ER for similar or worsening recurring problem that brought you to hospital or if any fever,nausea/vomiting,abdominal pain, uncontrolled pain, chest pain,  shortness of breath or any other alarming symptoms.  Please follow-up your doctor as instructed in a week  time and call the office for appointment.  Please avoid alcohol, smoking, or any other illicit substance and maintain healthy habits including taking your regular medications as prescribed.  You were cared for by a hospitalist during your hospital stay. If you have any questions about your discharge medications or the care you received while you were in the hospital after you are discharged, you can call the unit and ask to speak with the hospitalist on call if the hospitalist that took care of you is not available.  Once you are discharged, your primary care physician will handle any  further medical issues. Please note that NO REFILLS for any discharge medications will be authorized once you are discharged, as it is imperative that you return to your primary care physician (or establish a relationship with a primary care physician if you do not have one) for your aftercare needs so that they can reassess your need for medications and monitor your lab values   Please monitor weight daily, avoid strain and stop activity anytime you have chest pain or worsening shortness of breath. Anytime you have any of the following symptoms: 1) 3 pound weight gain in 24 hours or 5 pounds in 1 week 2) shortness of breath, with or without a dry hacking cough 3) swelling in the hands, feet or stomach 4) if you have to sleep on extra pillows at night in order to breathe.   Discharge wound care:   Complete by: As directed    Keep the area dry and clean.   Increase activity slowly   Complete by: As directed       Allergies as of 09/22/2020       Reactions   Other Anaphylaxis, Itching, Swelling   Allergy to GRITS , unknown exactly the specific ingredient he is allergic too        Medication List     STOP taking these medications    Basaglar KwikPen 100 UNIT/ML Replaced by: insulin glargine 100 UNIT/ML injection   fluconazole 150 MG tablet Commonly known as: DIFLUCAN   spironolactone 25 MG tablet Commonly known as: ALDACTONE       TAKE these medications    amLODipine 10 MG tablet Commonly known as: NORVASC Take 1 tablet (10 mg total) by mouth daily.   ammonium lactate 12 % lotion Commonly known as: LAC-HYDRIN Apply 1 application topically 2 (two) times daily. Both legs   aspirin 81 MG chewable tablet Chew 1 tablet (81 mg total) by mouth daily.   atorvastatin 80 MG tablet Commonly known as: LIPITOR Take 1 tablet (80 mg total) by mouth at bedtime.   carvedilol 12.5 MG tablet Commonly known as: COREG Take 1 tablet (12.5 mg total) by mouth 2 (two) times daily with a  meal.   empagliflozin 10 MG Tabs tablet Commonly known as: JARDIANCE Take 1 tablet (10 mg total) by mouth daily. Start taking on: September 23, 2020   Ensure Take 237 mLs by mouth 2 (two) times daily between meals.   ferrous sulfate 325 (65 FE) MG tablet Take 1 tablet (325 mg total) by mouth daily. Start taking on: September 23, 2020   insulin aspart 100 UNIT/ML injection Commonly known as: novoLOG Inject 2-12 Units into the skin See admin instructions. Check and record blood sugar before meals and inject per ssi. 151-200 = 2 units  201-250 = 4 units  251-300 = 6 units  301-350 = 8 units  351-400 = 10 units  greater than 400 = 12  units and call MD.   insulin glargine 100 UNIT/ML injection Commonly known as: LANTUS Inject 0.15 mLs (15 Units total) into the skin at bedtime. Replaces: Basaglar KwikPen 100 UNIT/ML   isosorbide-hydrALAZINE 20-37.5 MG tablet Commonly known as: BIDIL Take 1 tablet by mouth 3 (three) times daily.   loperamide 2 MG capsule Commonly known as: IMODIUM Take 1 capsule (2 mg total) by mouth as needed for diarrhea or loose stools.   NON FORMULARY Take 237 mLs by mouth in the morning and at bedtime. ensure drink   pantoprazole 40 MG tablet Commonly known as: PROTONIX Take 40 mg by mouth daily.   potassium chloride SA 20 MEQ tablet Commonly known as: KLOR-CON Take 1 tablet (20 mEq total) by mouth 2 (two) times daily.   pregabalin 75 MG capsule Commonly known as: LYRICA Take 1 capsule (75 mg total) by mouth 2 (two) times daily.   saccharomyces boulardii 250 MG capsule Commonly known as: FLORASTOR Take 1 capsule (250 mg total) by mouth 2 (two) times daily.   sacubitril-valsartan 49-51 MG Commonly known as: ENTRESTO Take 1 tablet by mouth 2 (two) times daily.   sertraline 50 MG tablet Commonly known as: ZOLOFT Take 50 mg by mouth daily.   tamsulosin 0.4 MG Caps capsule Commonly known as: FLOMAX Take 2 capsules (0.8 mg total) by mouth daily.    Torsemide 40 MG Tabs Take 40 mg by mouth 2 (two) times daily. Adjust Lasix dose based upon his weight and renal function in a week at SNF What changed:  medication strength when to take this additional instructions       ASK your doctor about these medications    ondansetron 4 MG disintegrating tablet Commonly known as: Zofran ODT 4mg  ODT q4 hours prn nausea/vomit   polyethylene glycol 17 g packet Commonly known as: MIRALAX / GLYCOLAX Take 17 g by mouth daily as needed.               Discharge Care Instructions  (From admission, onward)           Start     Ordered   09/07/20 0000  Discharge wound care:       Comments: Keep the area dry and clean.   09/07/20 1108            Allergies  Allergen Reactions   Other Anaphylaxis, Itching and Swelling    Allergy to GRITS , unknown exactly the specific ingredient he is allergic too   Consultations: Cardiology General Surgery Gastroenterology  Procedures/Studies: CT ABDOMEN PELVIS WO CONTRAST  Result Date: 08/30/2020 CLINICAL DATA:  Abdominal swelling. Query sigmoid volvulus. Follow-up scan from 06/24. EXAM: CT ABDOMEN AND PELVIS WITHOUT CONTRAST TECHNIQUE: Multidetector CT imaging of the abdomen and pelvis was performed following the standard protocol without IV contrast. COMPARISON:  08/18/2020 FINDINGS: Lower chest: Minimal bilateral pleural effusions. Motion artifact limits evaluation of the lungs but there appears to be some airspace disease, likely edema. Cardiac enlargement with small pericardial effusion. Hepatobiliary: Cholelithiasis with large stone in the gallbladder neck, unchanged. No focal liver lesions. Pancreas: Unremarkable. No pancreatic ductal dilatation or surrounding inflammatory changes. Spleen: Normal in size without focal abnormality. Adrenals/Urinary Tract: Adrenal glands are unremarkable. Kidneys are normal, without renal calculi, focal lesion, or hydronephrosis. Bladder is unremarkable.  Stomach/Bowel: Stomach, small bowel, and colon are not abnormally distended. There is gas and stool in the colon but no evidence of sigmoid volvulus currently. Appendix is not identified. Vascular/Lymphatic: Aortic atherosclerosis. No enlarged abdominal or  pelvic lymph nodes. Reproductive: Prostate is unremarkable. Other: Small amount of free fluid in the pelvis, likely ascites. No free air. Extensive edema throughout the subcutaneous fat. Musculoskeletal: Degenerative changes in the spine. No destructive bone lesions. Examination is technically limited due to body habitus of the patient. IMPRESSION: 1. No evidence of bowel obstruction or sigmoid volvulus. 2. Cholelithiasis. 3. Small bilateral pleural effusions with probable edema in the lung bases. Cardiac enlargement with small pericardial effusion. 4. Diffuse soft tissue edema throughout the subcutaneous fat. 5. Aortic atherosclerosis. Electronically Signed   By: Lucienne Capers M.D.   On: 08/30/2020 19:36   DG Abd 1 View  Result Date: 08/30/2020 CLINICAL DATA:  Abdominal distention.  Pain. EXAM: ABDOMEN - 1 VIEW COMPARISON:  08/29/2020.  CT 08/18/2020. FINDINGS: Colonic distention again noted. Similar finding noted on prior studies including CT of 08/18/2020. The possibility of sigmoid volvulus cannot be excluded on today's exam and repeat CT of the abdomen and pelvis suggested. No free air identified. Right hemidiaphragm incompletely imaged. No acute bony abnormality. IMPRESSION: Colonic distention again noted. Similar findings noted on prior study including CT of 08/18/2020. The possibly of the sigmoid volvulus cannot be excluded on today's exam and repeat CT of the abdomen and pelvis suggested. These results will be called to the ordering clinician or representative by the Radiologist Assistant, and communication documented in the PACS or [employer]. Electronically Signed   By: Marcello Moores  Register   On: 08/30/2020 11:46   DG Abd 1 View  Result  Date: 08/29/2020 CLINICAL DATA:  Abdominal pain. EXAM: ABDOMEN - 1 VIEW COMPARISON:  Jun 26, 2020. FINDINGS: Mild gaseous distension of large and small bowel is noted suggesting ileus or less likely obstruction. No calcifications are noted IMPRESSION: Mild gaseous distention of large and small bowel is noted suggesting ileus or less likely obstruction. Follow-up radiographs recommended. Electronically Signed   By: Marijo Conception M.D.   On: 08/29/2020 12:38     Subjective: Seen and examined at bedside and was doing "okay." No CP or SOB. No nausea or vomiting. Feels well and ready to go to SNF.  Discharge Exam: Vitals:   09/22/20 0332 09/22/20 0907  BP: 138/82 139/77  Pulse: 73 75  Resp: 20   Temp: 98 F (36.7 C)   SpO2: 97%    Vitals:   09/21/20 1419 09/21/20 2050 09/22/20 0332 09/22/20 0907  BP: (!) 144/94 127/75 138/82 139/77  Pulse: 75 74 73 75  Resp: 16 20 20    Temp: 98.2 F (36.8 C) 98.1 F (36.7 C) 98 F (36.7 C)   TempSrc:  Oral Oral   SpO2: 100% 95% 97%   Weight:   127.8 kg   Height:       General: Pt is alert, awake, not in acute distress Cardiovascular: RRR, S1/S2 +, no rubs, no gallops Respiratory: Diminished bilaterally, no wheezing, no rhonchi; Unlabored breathing  Abdominal: Soft, NT, Distended 2/2 body habitus, bowel sounds + Extremities: 1+ LE edema, no cyanosis  The results of significant diagnostics from this hospitalization (including imaging, microbiology, ancillary and laboratory) are listed below for reference.    Microbiology: Recent Results (from the past 240 hour(s))  SARS Coronavirus 2 by RT PCR (hospital order, performed in [place] hospital lab)     Status: None   Collection Time: 09/22/20  7:40 AM  Result Value Ref Range Status   SARS Coronavirus 2 NEGATIVE NEGATIVE Final    Comment: (NOTE) SARS-CoV-2 target nucleic acids are NOT DETECTED.  The SARS-CoV-2 RNA is generally detectable in upper and lower respiratory specimens during the acute  phase of infection. The lowest concentration of SARS-CoV-2 viral copies this assay can detect is 250 copies / mL. A negative result does not preclude SARS-CoV-2 infection and should not be used as the sole basis for treatment or other patient management decisions.  A negative result may occur with improper specimen collection / handling, submission of specimen other than nasopharyngeal swab, presence of viral mutation(s) within the areas targeted by this assay, and inadequate number of viral copies (<250 copies / mL). A negative result must be combined with clinical observations, patient history, and epidemiological information.  Fact Sheet for Patients:   StrictlyIdeas.no  Fact Sheet for Healthcare Providers: BankingDealers.co.za  This test is not yet approved or  cleared by the [place] FDA and has been authorized for detection and/or diagnosis of SARS-CoV-2 by FDA under an Emergency Use Authorization (EUA).  This EUA will remain in effect (meaning this test can be used) for the duration of the COVID-19 declaration under Section 564(b)(1) of the Act, 21 U.S.C. section 360bbb-3(b)(1), unless the authorization is terminated or revoked sooner.  Performed at [facility], [place] [address], [place], [place] [address]      Labs: BNP (last 3 results) Recent Labs    08/18/20 0123 08/21/20 1801 09/18/20 1018  BNP 139.2* 226.2* 476.5*   Basic Metabolic Panel: Recent Labs  Lab 09/16/20 0843 09/18/20 1018 09/20/20 0833 09/22/20 0720  NA 139 138 140 139  K 3.4* 3.2* 3.4* 3.5  CL 105 103 99 99  CO2 25 27 30 29   GLUCOSE 169* 229* 147* 165*  BUN 45* 46* 44* 43*  CREATININE 1.54* 1.67* 1.58* 1.63*  CALCIUM 8.9 8.7* 8.9 8.6*  MG  --   --  1.8 1.8  PHOS  --   --  4.9* 4.6   Liver Function Tests: Recent Labs  Lab 09/20/20 0833 09/22/20 0720  AST 20 21  ALT 27 25  ALKPHOS 76 78  BILITOT 0.4 0.5  PROT  6.7 6.7  ALBUMIN 2.8* 2.8*   No results for input(s): LIPASE, AMYLASE in the last 168 hours. No results for input(s): AMMONIA in the last 168 hours. CBC: Recent Labs  Lab 09/18/20 1018 09/20/20 0833 09/22/20 0720  WBC 3.6* 3.9* 3.6*  NEUTROABS  --  1.4* 1.4*  HGB 8.9* 9.5* 9.6*  HCT 28.5* 31.2* 31.8*  MCV 80.3 81.3 80.5  PLT 186 191 217   Cardiac Enzymes: No results for input(s): CKTOTAL, CKMB, CKMBINDEX, TROPONINI in the last 168 hours. BNP: Invalid input(s): POCBNP CBG: Recent Labs  Lab 09/21/20 0711 09/21/20 1131 09/21/20 1708 09/21/20 2043 09/22/20 0744  GLUCAP 148* 165* 154* 144* 154*   D-Dimer No results for input(s): DDIMER in the last 72 hours. Hgb A1c No results for input(s): HGBA1C in the last 72 hours. Lipid Profile No results for input(s): CHOL, HDL, LDLCALC, TRIG, CHOLHDL, LDLDIRECT in the last 72 hours. Thyroid function studies No results for input(s): TSH, T4TOTAL, T3FREE, THYROIDAB in the last 72 hours.  Invalid input(s): FREET3 Anemia work up No results for input(s): VITAMINB12, FOLATE, FERRITIN, TIBC, IRON, RETICCTPCT in the last 72 hours. Urinalysis    Component Value Date/Time   COLORURINE STRAW (A) 08/16/2020 1453   APPEARANCEUR CLEAR 08/16/2020 1453   APPEARANCEUR Clear 12/31/2013 2116   LABSPEC 1.008 08/16/2020 1453   LABSPEC 1.032 12/31/2013 2116   PHURINE 6.0 08/16/2020 1453   GLUCOSEU 150 (A)  08/16/2020 1453   GLUCOSEU >=500 12/31/2013 2116   HGBUR SMALL (A) 08/16/2020 1453   BILIRUBINUR NEGATIVE 08/16/2020 1453   BILIRUBINUR Negative 12/31/2013 2116   KETONESUR NEGATIVE 08/16/2020 1453   PROTEINUR 100 (A) 08/16/2020 1453   UROBILINOGEN 1.0 08/28/2013 1709   NITRITE NEGATIVE 08/16/2020 1453   LEUKOCYTESUR NEGATIVE 08/16/2020 1453   LEUKOCYTESUR Negative 12/31/2013 2116   Sepsis Labs Invalid input(s): PROCALCITONIN,  WBC,  LACTICIDVEN Microbiology Recent Results (from the past 240 hour(s))  SARS Coronavirus 2 by RT PCR  (hospital order, performed in [place] hospital lab)     Status: None   Collection Time: 09/22/20  7:40 AM  Result Value Ref Range Status   SARS Coronavirus 2 NEGATIVE NEGATIVE Final    Comment: (NOTE) SARS-CoV-2 target nucleic acids are NOT DETECTED.  The SARS-CoV-2 RNA is generally detectable in upper and lower respiratory specimens during the acute phase of infection. The lowest concentration of SARS-CoV-2 viral copies this assay can detect is 250 copies / mL. A negative result does not preclude SARS-CoV-2 infection and should not be used as the sole basis for treatment or other patient management decisions.  A negative result may occur with improper specimen collection / handling, submission of specimen other than nasopharyngeal swab, presence of viral mutation(s) within the areas targeted by this assay, and inadequate number of viral copies (<250 copies / mL). A negative result must be combined with clinical observations, patient history, and epidemiological information.  Fact Sheet for Patients:   StrictlyIdeas.no  Fact Sheet for Healthcare Providers: BankingDealers.co.za  This test is not yet approved or  cleared by the [place] FDA and has been authorized for detection and/or diagnosis of SARS-CoV-2 by FDA under an Emergency Use Authorization (EUA).  This EUA will remain in effect (meaning this test can be used) for the duration of the COVID-19 declaration under Section 564(b)(1) of the Act, 21 U.S.C. section 360bbb-3(b)(1), unless the authorization is terminated or revoked sooner.  Performed at [facility], [place] [address], [place], [place] [address]    Time coordinating discharge: 35 minutes  SIGNED:  Kerney Elbe, DO [facility] 09/22/2020, 9:20 AM Pager is on [place]  If 7PM-7AM, please contact night-coverage [email or web address]

## 2020-09-22 NOTE — Plan of Care (Signed)
  Problem: Education: Goal: Knowledge of General Education information will improve Description Including pain rating scale, medication(s)/side effects and non-pharmacologic comfort measures Outcome: Progressing   

## 2020-09-22 NOTE — TOC Transition Note (Addendum)
Transition of Care [facility]) - CM/SW Discharge Note   Patient Details  Name: [name] MRN: [id number] Date of Birth: 1961-01-12  Transition of Care [facility]) CM/SW Contact:  Dessa Phi, RN Phone Number: 09/22/2020, 9:10 AM   Clinical Narrative: patient approved for SNF [facility] per [facility] FBPZ#[id number] [phone] for [place] informed. Covid neg 7/28. If medically stable-Awaiting d/c summary,rm#,tel# for nsg report prior calling PTAR. MD notified. 12:12p-going to rm#125,nsg callr eport tel# [phone]. PTAR called. No further CM needs.     Final next level of care: Skilled Nursing Facility Barriers to Discharge: No Barriers Identified   Patient Goals and CMS Choice Patient states their goals for this hospitalization and ongoing recovery are:: go to SNF CMS Medicare.gov Compare Post Acute Care list provided to:: Patient Choice offered to / list presented to : Patient  Discharge Placement PASRR number recieved: 09/15/20            Patient chooses bed at: Other - please specify in the comment section below: [facility]) Patient to be transferred to facility by: [place] Name of family member notified: patient states will contact his brother on own Patient and family notified of of transfer: 09/22/20  Discharge Plan and Services   Discharge Planning Services: CM Consult Post Acute Care Choice: Home Health                               Social Determinants of Health (SDOH) Interventions     Readmission Risk Interventions Readmission Risk Prevention Plan 01/12/2020  Transportation Screening Complete  Palliative Care Screening Not Applicable  Some recent data might be hidden

## 2020-09-22 NOTE — Progress Notes (Addendum)
Attempted to call [place] twice to give report ([phone]), was unsuccessful in reaching anyone.

## 2020-09-25 ENCOUNTER — Ambulatory Visit: Payer: Medicare HMO | Admitting: Student

## 2020-09-25 DIAGNOSIS — I10 Essential (primary) hypertension: Secondary | ICD-10-CM | POA: Diagnosis not present

## 2020-09-25 DIAGNOSIS — E782 Mixed hyperlipidemia: Secondary | ICD-10-CM | POA: Diagnosis not present

## 2020-09-25 DIAGNOSIS — J45991 Cough variant asthma: Secondary | ICD-10-CM | POA: Diagnosis not present

## 2020-09-25 DIAGNOSIS — E1165 Type 2 diabetes mellitus with hyperglycemia: Secondary | ICD-10-CM | POA: Diagnosis not present

## 2020-09-26 DIAGNOSIS — I5030 Unspecified diastolic (congestive) heart failure: Secondary | ICD-10-CM | POA: Diagnosis not present

## 2020-09-26 DIAGNOSIS — E1140 Type 2 diabetes mellitus with diabetic neuropathy, unspecified: Secondary | ICD-10-CM | POA: Diagnosis not present

## 2020-09-26 DIAGNOSIS — I10 Essential (primary) hypertension: Secondary | ICD-10-CM | POA: Diagnosis not present

## 2020-09-26 DIAGNOSIS — E119 Type 2 diabetes mellitus without complications: Secondary | ICD-10-CM | POA: Diagnosis not present

## 2020-09-27 DIAGNOSIS — I10 Essential (primary) hypertension: Secondary | ICD-10-CM | POA: Diagnosis not present

## 2020-09-28 DIAGNOSIS — I5030 Unspecified diastolic (congestive) heart failure: Secondary | ICD-10-CM | POA: Diagnosis not present

## 2020-09-28 DIAGNOSIS — E1140 Type 2 diabetes mellitus with diabetic neuropathy, unspecified: Secondary | ICD-10-CM | POA: Diagnosis not present

## 2020-09-28 DIAGNOSIS — I10 Essential (primary) hypertension: Secondary | ICD-10-CM | POA: Diagnosis not present

## 2020-09-28 DIAGNOSIS — I2510 Atherosclerotic heart disease of native coronary artery without angina pectoris: Secondary | ICD-10-CM | POA: Diagnosis not present

## 2020-10-02 DIAGNOSIS — E119 Type 2 diabetes mellitus without complications: Secondary | ICD-10-CM | POA: Diagnosis not present

## 2020-10-02 DIAGNOSIS — I5030 Unspecified diastolic (congestive) heart failure: Secondary | ICD-10-CM | POA: Diagnosis not present

## 2020-10-02 DIAGNOSIS — I10 Essential (primary) hypertension: Secondary | ICD-10-CM | POA: Diagnosis not present

## 2020-10-02 DIAGNOSIS — E1140 Type 2 diabetes mellitus with diabetic neuropathy, unspecified: Secondary | ICD-10-CM | POA: Diagnosis not present

## 2020-10-09 DIAGNOSIS — E1140 Type 2 diabetes mellitus with diabetic neuropathy, unspecified: Secondary | ICD-10-CM | POA: Diagnosis not present

## 2020-10-09 DIAGNOSIS — I10 Essential (primary) hypertension: Secondary | ICD-10-CM | POA: Diagnosis not present

## 2020-10-09 DIAGNOSIS — E119 Type 2 diabetes mellitus without complications: Secondary | ICD-10-CM | POA: Diagnosis not present

## 2020-10-09 DIAGNOSIS — I5030 Unspecified diastolic (congestive) heart failure: Secondary | ICD-10-CM | POA: Diagnosis not present

## 2020-10-10 DIAGNOSIS — D649 Anemia, unspecified: Secondary | ICD-10-CM | POA: Diagnosis not present

## 2020-10-10 DIAGNOSIS — D513 Other dietary vitamin B12 deficiency anemia: Secondary | ICD-10-CM | POA: Diagnosis not present

## 2020-10-10 DIAGNOSIS — E0800 Diabetes mellitus due to underlying condition with hyperosmolarity without nonketotic hyperglycemic-hyperosmolar coma (NKHHC): Secondary | ICD-10-CM | POA: Diagnosis not present

## 2020-10-10 DIAGNOSIS — E559 Vitamin D deficiency, unspecified: Secondary | ICD-10-CM | POA: Diagnosis not present

## 2020-10-16 DIAGNOSIS — I5030 Unspecified diastolic (congestive) heart failure: Secondary | ICD-10-CM | POA: Diagnosis not present

## 2020-10-16 DIAGNOSIS — E119 Type 2 diabetes mellitus without complications: Secondary | ICD-10-CM | POA: Diagnosis not present

## 2020-10-16 DIAGNOSIS — I10 Essential (primary) hypertension: Secondary | ICD-10-CM | POA: Diagnosis not present

## 2020-10-16 DIAGNOSIS — E1140 Type 2 diabetes mellitus with diabetic neuropathy, unspecified: Secondary | ICD-10-CM | POA: Diagnosis not present

## 2020-10-17 DIAGNOSIS — E559 Vitamin D deficiency, unspecified: Secondary | ICD-10-CM | POA: Diagnosis not present

## 2020-10-24 ENCOUNTER — Emergency Department (HOSPITAL_COMMUNITY): Payer: Medicare HMO

## 2020-10-24 ENCOUNTER — Other Ambulatory Visit: Payer: Self-pay

## 2020-10-24 ENCOUNTER — Emergency Department (HOSPITAL_COMMUNITY)
Admission: EM | Admit: 2020-10-24 | Discharge: 2020-10-25 | Disposition: A | Discharge status: Home / Self Care | Payer: Medicare HMO | Attending: Emergency Medicine | Admitting: Emergency Medicine

## 2020-10-24 DIAGNOSIS — R0781 Pleurodynia: Secondary | ICD-10-CM | POA: Insufficient documentation

## 2020-10-24 DIAGNOSIS — Z7982 Long term (current) use of aspirin: Secondary | ICD-10-CM | POA: Diagnosis not present

## 2020-10-24 DIAGNOSIS — Z79899 Other long term (current) drug therapy: Secondary | ICD-10-CM | POA: Insufficient documentation

## 2020-10-24 DIAGNOSIS — I110 Hypertensive heart disease with heart failure: Secondary | ICD-10-CM | POA: Insufficient documentation

## 2020-10-24 DIAGNOSIS — I509 Heart failure, unspecified: Secondary | ICD-10-CM | POA: Insufficient documentation

## 2020-10-24 DIAGNOSIS — Z794 Long term (current) use of insulin: Secondary | ICD-10-CM | POA: Insufficient documentation

## 2020-10-24 DIAGNOSIS — R079 Chest pain, unspecified: Secondary | ICD-10-CM | POA: Diagnosis not present

## 2020-10-24 DIAGNOSIS — Z743 Need for continuous supervision: Secondary | ICD-10-CM | POA: Diagnosis not present

## 2020-10-24 DIAGNOSIS — I313 Pericardial effusion (noninflammatory): Secondary | ICD-10-CM

## 2020-10-24 DIAGNOSIS — R0789 Other chest pain: Secondary | ICD-10-CM | POA: Insufficient documentation

## 2020-10-24 DIAGNOSIS — R600 Localized edema: Secondary | ICD-10-CM | POA: Diagnosis not present

## 2020-10-24 DIAGNOSIS — E119 Type 2 diabetes mellitus without complications: Secondary | ICD-10-CM | POA: Diagnosis not present

## 2020-10-24 DIAGNOSIS — Z87891 Personal history of nicotine dependence: Secondary | ICD-10-CM | POA: Insufficient documentation

## 2020-10-24 DIAGNOSIS — R609 Edema, unspecified: Secondary | ICD-10-CM | POA: Diagnosis not present

## 2020-10-24 DIAGNOSIS — J9811 Atelectasis: Secondary | ICD-10-CM | POA: Diagnosis not present

## 2020-10-24 DIAGNOSIS — E042 Nontoxic multinodular goiter: Secondary | ICD-10-CM | POA: Diagnosis not present

## 2020-10-24 DIAGNOSIS — I3139 Other pericardial effusion (noninflammatory): Secondary | ICD-10-CM

## 2020-10-24 DIAGNOSIS — R140 Abdominal distension (gaseous): Secondary | ICD-10-CM | POA: Diagnosis not present

## 2020-10-24 DIAGNOSIS — E041 Nontoxic single thyroid nodule: Secondary | ICD-10-CM | POA: Diagnosis not present

## 2020-10-24 LAB — BASIC METABOLIC PANEL
Anion gap: 12 (ref 5–15)
BUN: 49 mg/dL — ABNORMAL HIGH (ref 6–20)
CO2: 21 mmol/L — ABNORMAL LOW (ref 22–32)
Calcium: 9.1 mg/dL (ref 8.9–10.3)
Chloride: 106 mmol/L (ref 98–111)
Creatinine, Ser: 1.82 mg/dL — ABNORMAL HIGH (ref 0.61–1.24)
GFR, Estimated: 42 mL/min — ABNORMAL LOW (ref >60)
Glucose, Bld: 137 mg/dL — ABNORMAL HIGH (ref 70–99)
Potassium: 4.3 mmol/L (ref 3.5–5.1)
Sodium: 139 mmol/L (ref 135–145)

## 2020-10-24 LAB — CBC
HCT: 33.9 % — ABNORMAL LOW (ref 39.0–52.0)
Hemoglobin: 10.4 g/dL — ABNORMAL LOW (ref 13.0–17.0)
MCH: 24.6 pg — ABNORMAL LOW (ref 26.0–34.0)
MCHC: 30.7 g/dL (ref 30.0–36.0)
MCV: 80.3 fL (ref 80.0–100.0)
Platelets: 260 10*3/uL (ref 150–400)
RBC: 4.22 MIL/uL (ref 4.22–5.81)
RDW: 15.9 % — ABNORMAL HIGH (ref 11.5–15.5)
WBC: 3.6 10*3/uL — ABNORMAL LOW (ref 4.0–10.5)
nRBC: 0 % (ref 0.0–0.2)

## 2020-10-24 LAB — D-DIMER, QUANTITATIVE: D-Dimer, Quant: 9.35 ug/mL-FEU — ABNORMAL HIGH (ref 0.00–0.50)

## 2020-10-24 LAB — BRAIN NATRIURETIC PEPTIDE: B Natriuretic Peptide: 36.3 pg/mL (ref 0.0–100.0)

## 2020-10-24 LAB — TROPONIN I (HIGH SENSITIVITY)
Troponin I (High Sensitivity): 19 ng/L — ABNORMAL HIGH (ref <18)
Troponin I (High Sensitivity): 18 ng/L — ABNORMAL HIGH (ref <18)

## 2020-10-24 MED ORDER — IOHEXOL 350 MG/ML SOLN
65.0000 mL | Freq: Once | INTRAVENOUS | Status: AC | PRN
  Administered 2020-10-24: 65 mL via INTRAVENOUS

## 2020-10-24 NOTE — ED Provider Notes (Signed)
[facility] EMERGENCY DEPARTMENT Provider Note  CSN: [id number] Arrival date & time: 10/24/20 1134    History Chief Complaint  Patient presents with  . Chest Pain    [name] is a 60 y.o. male with history of DM, HTN, stroke and CHF reports 2 days of sharp mid chest pains, going into his R neck. Not associated with SOB. Had been living in ALF until June when he was admitted to [facility] for CHF exacerbation and subsequently discharged to SNF where he currently is living. He is not ambulatory due to prior stroke but he is up and into wheelchair during the day.    Past Medical History:  Diagnosis Date  . Arthritis   . Bipolar affective ([place])   . CHF (congestive heart failure) ([place])   . Claustrophobia    Severe  . Depression   . Diabetes mellitus    uncontrolled   . Heart murmur   . Hypertension     Past Surgical History:  Procedure Laterality Date  . BOWEL DECOMPRESSION N/A 01/19/2020   Procedure: BOWEL DECOMPRESSION;  Surgeon: Otis Brace, MD;  Location: [place];  Service: Gastroenterology;  Laterality: N/A;  . COLONOSCOPY WITH PROPOFOL N/A 01/19/2020   Procedure: COLONOSCOPY WITH PROPOFOL;  Surgeon: Otis Brace, MD;  Location: [place];  Service: Gastroenterology;  Laterality: N/A;  . KNEE SURGERY     b/l knees; pending total knee   . RADIOLOGY WITH ANESTHESIA N/A 07/04/2020   Procedure: MRI WITH ANESTHESIA BRAIN WITHOUT CONTRAST AND HEAD WITHOUT CONTRAST;  Surgeon: Radiologist, Medication, MD;  Location: [place];  Service: Radiology;  Laterality: N/A;    Family History  Problem Relation Age of Onset  . Diabetes Other   . Cancer Mother        uterus; deceased   . Heart attack Mother 24  . Hypertension Other   . Heart attack Father 78  . Stroke Sister        12/2012 age 6 y.o    Social History   Tobacco Use  . Smoking status: Former  . Smokeless tobacco: Never  Vaping Use  . Vaping Use: Never used  Substance Use Topics  . Alcohol use: No   . Drug use: Not Currently    Types: Cocaine    Comment: Last use 2019     Home Medications Prior to Admission medications   Medication Sig Start Date End Date Taking? Authorizing Provider  amLODipine (NORVASC) 10 MG tablet Take 10 mg by mouth daily.   Yes [provider]  aspirin 81 MG chewable tablet Chew by mouth daily.   Yes [provider]  atorvastatin (LIPITOR) 80 MG tablet Take 80 mg by mouth at bedtime.   Yes [provider]  carvedilol (COREG) 12.5 MG tablet Take 1 tablet (12.5 mg total) by mouth 2 (two) times daily with a meal. 09/07/20 09/02/21 Yes Kc, Ramesh, MD  empagliflozin (JARDIANCE) 10 MG TABS tablet Take 1 tablet (10 mg total) by mouth daily. 09/23/20  Yes [name], [name], DO  Ensure (ENSURE) Take 237 mLs by mouth 5 (five) times daily.   Yes [provider]  ferrous sulfate 325 (65 FE) MG tablet Take 1 tablet (325 mg total) by mouth daily. 09/23/20  Yes [name], [name], DO  insulin aspart (NOVOLOG) 100 UNIT/ML injection Inject 2-12 Units into the skin See admin instructions. Check and record blood sugar before meals and inject per ssi. 151-200 = 2 units  201-250 = 4 units  251-300 = 6 units  301-350 = 8 units  351-400 = 10 units  greater than 400 = 12 units and call MD. 09/07/20  Yes Antonieta Pert, MD  insulin glargine (LANTUS) 100 UNIT/ML injection Inject 0.15 mLs (15 Units total) into the skin at bedtime. 09/22/20  Yes [name], [name], DO  isosorbide-hydrALAZINE (BIDIL) 20-37.5 MG tablet Take 1 tablet by mouth 3 (three) times daily. 09/07/20  Yes Antonieta Pert, MD  loperamide (IMODIUM) 2 MG capsule Take 1 capsule (2 mg total) by mouth as needed for diarrhea or loose stools. 09/22/20  Yes [name], [name], DO  NON FORMULARY Take 237 mLs by mouth in the morning and at bedtime. ensure drink   Yes [provider]  ondansetron (ZOFRAN ODT) 4 MG disintegrating tablet 4mg  ODT q4 hours prn nausea/vomit Patient taking differently:  Take 4 mg by mouth every 4 (four) hours as needed for nausea or vomiting. 07/11/20  Yes Drenda Freeze, MD  pantoprazole (PROTONIX) 40 MG tablet Take 40 mg by mouth daily.   Yes [provider]  polyethylene glycol (MIRALAX / GLYCOLAX) 17 g packet Take 17 g by mouth daily as needed. Patient taking differently: Take 17 g by mouth daily as needed for mild constipation. 03/30/20  Yes [name], NP  potassium chloride (KLOR-CON) 10 MEQ tablet Take 20 mEq by mouth 2 (two) times daily.   Yes [provider]  pregabalin (LYRICA) 75 MG capsule Take 75 mg by mouth 2 (two) times daily.   Yes [provider]  saccharomyces boulardii (FLORASTOR) 250 MG capsule Take 1 capsule (250 mg total) by mouth 2 (two) times daily. 09/07/20  Yes Kc, Maren [place], MD  sacubitril-valsartan (ENTRESTO) 49-51 MG Take 1 tablet by mouth 2 (two) times daily.   Yes [provider]  senna-docusate (SENOKOT-S) 8.6-50 MG tablet Take 1 tablet by mouth daily.   Yes [provider]  sertraline (ZOLOFT) 50 MG tablet Take 50 mg by mouth daily.   Yes [provider]  tamsulosin (FLOMAX) 0.4 MG CAPS capsule Take 0.8 mg by mouth daily.   Yes [provider]  torsemide (DEMADEX) 20 MG tablet Take 40 mg by mouth 2 (two) times daily.   Yes [provider]  Vitamin D, Ergocalciferol, (DRISDOL) 1.25 MG (50000 UNIT) CAPS capsule Take 50,000 Units by mouth every 7 (seven) days.   Yes [provider]  amLODipine (NORVASC) 10 MG tablet Take 1 tablet (10 mg total) by mouth daily. 07/05/20 10/03/20  [place] Donnamarie Poag, DO  atorvastatin (LIPITOR) 80 MG tablet Take 1 tablet (80 mg total) by mouth at bedtime. 07/05/20 10/03/20  [place] Donnamarie Poag, DO  potassium chloride SA (KLOR-CON) 20 MEQ tablet Take 1 tablet (20 mEq total) by mouth 2 (two) times daily. Patient not taking: Reported on 10/24/2020 09/07/20   Antonieta Pert, MD  pregabalin (LYRICA) 75 MG capsule Take 1 capsule (75 mg total)  by mouth 2 (two) times daily. 07/05/20 10/03/20  [place] Donnamarie Poag, DO  torsemide 40 MG TABS Take 40 mg by mouth 2 (two) times daily. Adjust Lasix dose based upon his weight and renal function in a week at [facility] 09/14/20 10/14/20  Antonieta Pert, MD     Allergies    Other   Review of Systems   Review of Systems A comprehensive review of systems was completed and negative except as noted in HPI.    Physical Exam BP 132/87   Pulse 71   Temp 97.8 F (36.6  C) (Oral)   Resp 10   SpO2 96%   Physical Exam Vitals and nursing note reviewed.  Constitutional:      Appearance: Normal appearance.  HENT:     Head: Normocephalic and atraumatic.     Nose: Nose normal.     Mouth/Throat:     Mouth: Mucous membranes are moist.  Eyes:     Extraocular Movements: Extraocular movements intact.     Conjunctiva/sclera: Conjunctivae normal.  Cardiovascular:     Rate and Rhythm: Normal rate.  Pulmonary:     Effort: Pulmonary effort is normal.     Breath sounds: Normal breath sounds. No wheezing or rales.  Chest:     Chest wall: Tenderness present.  Abdominal:     General: Abdomen is flat.     Palpations: Abdomen is soft.     Tenderness: There is no abdominal tenderness.  Musculoskeletal:        General: No swelling. Normal range of motion.     Cervical back: Neck supple.     Right lower leg: Edema present.     Left lower leg: Edema present.  Skin:    General: Skin is warm and dry.  Neurological:     General: No focal deficit present.     Mental Status: He is alert.  Psychiatric:        Mood and Affect: Mood normal.     ED Results / Procedures / Treatments   Labs (all labs ordered are listed, but only abnormal results are displayed) Labs Reviewed  CBC - Abnormal; Notable for the following components:      Result Value   WBC 3.6 (*)    Hemoglobin 10.4 (*)    HCT 33.9 (*)    MCH 24.6 (*)    RDW 15.9 (*)    All other components within normal limits  BASIC METABOLIC PANEL  BRAIN  NATRIURETIC PEPTIDE  TROPONIN I (HIGH SENSITIVITY)    EKG EKG Interpretation  Date/Time:  Tuesday October 24 2020 12:29:57 EDT Ventricular Rate:  69 PR Interval:  195 QRS Duration: 99 QT Interval:  419 QTC Calculation: 449 R Axis:   11 Text Interpretation: Sinus rhythm LAE, consider biatrial enlargement Abnormal R-wave progression, early transition Borderline T abnormalities, lateral leads No significant change since last tracing Confirmed by Calvert Cantor [phone]) on 10/24/2020 12:42:57 PM  Radiology No results found.  Procedures Procedures  Medications Ordered in the ED Medications - No data to display   MDM Rules/Calculators/A&P MDM Patient with atypical sharp, pleuritic chest pain. Respiratory status is normal now. He does have some LE edema that appears chronic. Given his relatively sedentary life, will check dimer to eval possible PE. EKG without acute changes.   ED Course  I have reviewed the triage vital signs and the nursing notes.  Pertinent labs & imaging results that were available during my care of the patient were reviewed by me and considered in my medical decision making (see chart for details).  Clinical Course as of 10/24/20 1616  Tue Oct 24, 2020  1258 CBC with mild leukopenia.  [CS]  7867 CXR without acute process.  [CS]  6720 BMP with CKD similar to previous. Trop is mildly elevated will check delta for trend but similar to previous.  [CS]  9470 BNP is not significantly elevated.  [CS]  9628 Dimer is markedly elevated, will send for CTA PE.  [CS]  1610 Care of the patient signed out to Dr. Tyrone Nine at the change of shift  pending CT.  [CS]    Clinical Course User Index [CS] Truddie Hidden, MD    Final Clinical Impression(s) / ED Diagnoses Final diagnoses:  None    Rx / DC Orders ED Discharge Orders     None        Truddie Hidden, MD 10/24/20 1616

## 2020-10-24 NOTE — ED Notes (Signed)
Pt given crackers and drink in room. Denies any other needs at this time.

## 2020-10-24 NOTE — ED Triage Notes (Addendum)
Pt BIB EMS due to chest pain. Pt is from [place] and went to [place] 2 weeks ago for the same issue. Pt received as[pirin via EMS. Pt states he thiks [place] [facility] gives him too many medications. Pt refuses medication. VSS.

## 2020-10-24 NOTE — ED Notes (Signed)
Patient transported to CT 

## 2020-10-24 NOTE — ED Notes (Signed)
CTA

## 2020-10-24 NOTE — Discharge Instructions (Addendum)
The blood work was not consistent with a heart attack, your CT scan of your chest do not show a blood clot in the lung.  You do have a small amount of fluid around your heart.  Please follow-up with your family doctor and cardiologist for this.  Please return for worsening or persistent chest pain.  Feeling like you may pass out.

## 2020-10-24 NOTE — ED Provider Notes (Signed)
Received patient in turnover from Dr. Karle Starch.  Please see their note for further details of Hx, PE.  Briefly patient is a 60 y.o. male with a Chest Pain .  Current plan is to obtain Cta chest.  Non specific pain, ddimer elevated.    CT angiogram chest is negative for pulmonary embolism.  The patient does have a pericardial effusion on CT scan.  Looking at his records the patient had a pericardial effusion at his last admission.  Trace pericardial effusion on my bedside echo.  No signs of tamponade.  No symptoms of tamponade.  Chest pain is reproducible on palpation of the chest wall.  We will have him follow-up with his family doctor in the office.    EMERGENCY DEPARTMENT [place] CARDIAC EXAM "Study: Limited Ultrasound of the Heart and Pericardium"  INDICATIONS:Chest pain Multiple views of the heart and pericardium were obtained in real-time with a multi-frequency probe.  PERFORMED RF:FMBWGY IMAGES ARCHIVED?: Yes LIMITATIONS:  Body habitus VIEWS USED: Subcostal 4 chamber, Parasternal long axis, Parasternal short axis, and Apical 4 chamber  INTERPRETATION: Cardiac activity present, Pericardial effusion present, Amount of pericardial effusion trace, Cardiac tamponade absent, and Normal contractility     Deno Etienne, DO 10/24/20 1811

## 2020-10-25 DIAGNOSIS — Z7401 Bed confinement status: Secondary | ICD-10-CM | POA: Diagnosis not present

## 2020-10-25 DIAGNOSIS — Z743 Need for continuous supervision: Secondary | ICD-10-CM | POA: Diagnosis not present

## 2020-10-25 DIAGNOSIS — R079 Chest pain, unspecified: Secondary | ICD-10-CM | POA: Diagnosis not present

## 2020-10-25 NOTE — ED Notes (Signed)
Pt in room sleeping. NAD chest rising and falling. Still awaiting PTAR

## 2020-10-26 DIAGNOSIS — R079 Chest pain, unspecified: Secondary | ICD-10-CM | POA: Diagnosis not present

## 2020-10-26 DIAGNOSIS — E119 Type 2 diabetes mellitus without complications: Secondary | ICD-10-CM | POA: Diagnosis not present

## 2020-10-26 DIAGNOSIS — I5030 Unspecified diastolic (congestive) heart failure: Secondary | ICD-10-CM | POA: Diagnosis not present

## 2020-11-06 DIAGNOSIS — M79641 Pain in right hand: Secondary | ICD-10-CM | POA: Diagnosis not present

## 2020-11-07 DIAGNOSIS — M19041 Primary osteoarthritis, right hand: Secondary | ICD-10-CM | POA: Diagnosis not present

## 2020-11-07 DIAGNOSIS — M19031 Primary osteoarthritis, right wrist: Secondary | ICD-10-CM | POA: Diagnosis not present

## 2020-11-09 DIAGNOSIS — K219 Gastro-esophageal reflux disease without esophagitis: Secondary | ICD-10-CM | POA: Diagnosis not present

## 2020-11-09 DIAGNOSIS — N400 Enlarged prostate without lower urinary tract symptoms: Secondary | ICD-10-CM | POA: Diagnosis not present

## 2020-11-09 DIAGNOSIS — M79641 Pain in right hand: Secondary | ICD-10-CM | POA: Diagnosis not present

## 2020-11-09 DIAGNOSIS — I639 Cerebral infarction, unspecified: Secondary | ICD-10-CM | POA: Diagnosis not present

## 2020-11-11 DIAGNOSIS — K219 Gastro-esophageal reflux disease without esophagitis: Secondary | ICD-10-CM | POA: Diagnosis not present

## 2020-11-11 DIAGNOSIS — R451 Restlessness and agitation: Secondary | ICD-10-CM | POA: Diagnosis not present

## 2020-11-11 DIAGNOSIS — R69 Illness, unspecified: Secondary | ICD-10-CM | POA: Diagnosis not present

## 2020-11-11 DIAGNOSIS — R296 Repeated falls: Secondary | ICD-10-CM | POA: Diagnosis not present

## 2020-11-12 DIAGNOSIS — B961 Klebsiella pneumoniae [K. pneumoniae] as the cause of diseases classified elsewhere: Secondary | ICD-10-CM | POA: Diagnosis not present

## 2020-11-12 DIAGNOSIS — R4182 Altered mental status, unspecified: Secondary | ICD-10-CM | POA: Diagnosis not present

## 2020-11-13 DIAGNOSIS — R609 Edema, unspecified: Secondary | ICD-10-CM | POA: Diagnosis not present

## 2020-11-15 ENCOUNTER — Encounter (HOSPITAL_COMMUNITY): Payer: Self-pay | Admitting: Emergency Medicine

## 2020-11-15 ENCOUNTER — Emergency Department (HOSPITAL_COMMUNITY)
Admission: EM | Admit: 2020-11-15 | Discharge: 2020-11-16 | Disposition: A | Discharge status: Home / Self Care | Payer: Medicare HMO | Attending: Student | Admitting: Student

## 2020-11-15 ENCOUNTER — Emergency Department (HOSPITAL_COMMUNITY): Payer: Medicare HMO

## 2020-11-15 DIAGNOSIS — I130 Hypertensive heart and chronic kidney disease with heart failure and stage 1 through stage 4 chronic kidney disease, or unspecified chronic kidney disease: Secondary | ICD-10-CM | POA: Insufficient documentation

## 2020-11-15 DIAGNOSIS — M79601 Pain in right arm: Secondary | ICD-10-CM

## 2020-11-15 DIAGNOSIS — M79621 Pain in right upper arm: Secondary | ICD-10-CM | POA: Diagnosis not present

## 2020-11-15 DIAGNOSIS — M7989 Other specified soft tissue disorders: Secondary | ICD-10-CM | POA: Diagnosis not present

## 2020-11-15 DIAGNOSIS — Z7982 Long term (current) use of aspirin: Secondary | ICD-10-CM | POA: Insufficient documentation

## 2020-11-15 DIAGNOSIS — M79641 Pain in right hand: Secondary | ICD-10-CM | POA: Diagnosis not present

## 2020-11-15 DIAGNOSIS — Z79899 Other long term (current) drug therapy: Secondary | ICD-10-CM | POA: Diagnosis not present

## 2020-11-15 DIAGNOSIS — Z87891 Personal history of nicotine dependence: Secondary | ICD-10-CM | POA: Diagnosis not present

## 2020-11-15 DIAGNOSIS — N1831 Chronic kidney disease, stage 3a: Secondary | ICD-10-CM | POA: Insufficient documentation

## 2020-11-15 DIAGNOSIS — M19011 Primary osteoarthritis, right shoulder: Secondary | ICD-10-CM | POA: Diagnosis not present

## 2020-11-15 DIAGNOSIS — W1839XA Other fall on same level, initial encounter: Secondary | ICD-10-CM | POA: Insufficient documentation

## 2020-11-15 DIAGNOSIS — M25511 Pain in right shoulder: Secondary | ICD-10-CM | POA: Diagnosis not present

## 2020-11-15 DIAGNOSIS — I5033 Acute on chronic diastolic (congestive) heart failure: Secondary | ICD-10-CM | POA: Diagnosis not present

## 2020-11-15 DIAGNOSIS — E1122 Type 2 diabetes mellitus with diabetic chronic kidney disease: Secondary | ICD-10-CM | POA: Insufficient documentation

## 2020-11-15 DIAGNOSIS — Z794 Long term (current) use of insulin: Secondary | ICD-10-CM | POA: Diagnosis not present

## 2020-11-15 DIAGNOSIS — M25519 Pain in unspecified shoulder: Secondary | ICD-10-CM | POA: Diagnosis not present

## 2020-11-15 DIAGNOSIS — Z743 Need for continuous supervision: Secondary | ICD-10-CM | POA: Diagnosis not present

## 2020-11-15 DIAGNOSIS — R52 Pain, unspecified: Secondary | ICD-10-CM

## 2020-11-15 DIAGNOSIS — R1110 Vomiting, unspecified: Secondary | ICD-10-CM | POA: Diagnosis not present

## 2020-11-15 MED ORDER — OXYCODONE-ACETAMINOPHEN 5-325 MG PO TABS
1.0000 | ORAL_TABLET | Freq: Once | ORAL | Status: AC
  Administered 2020-11-15: 1 via ORAL
  Filled 2020-11-15: qty 1

## 2020-11-15 NOTE — Discharge Instructions (Signed)
You were seen and evaluated in the emergency department today for further evaluation of right arm pain.  As we discussed, there is not appear to be any fractures or dislocations over the shoulder, elbow, wrist, or hand.  You will be sore in the coming days.  Tylenol/ibuprofen for pain.  Please return to the emergency department if you are experiencing worsening pain, severe and worsening swelling, weakness/numbness to the right arm, firmness to the muscles, or any other concerns you may have.  Please follow-up with your primary care provider within the next week.

## 2020-11-15 NOTE — ED Triage Notes (Signed)
Per EMS, from [place] [facility]-fell out of bed transferring to [facility] on Saturday--complaining of right shoulder pain and right hand pain

## 2020-11-15 NOTE — ED Provider Notes (Signed)
[facility] DEPT Provider Note   CSN: [id number] Arrival date & time: 11/15/20  1817     History Chief Complaint  Patient presents with   Shoulder Pain    [name] [name] a 60 y.o. male [facility] for right arm pain after a fall that occurred 5 days ago.  Roy states that Roy was attempting to get off a bed at his facility when the bed moved as it was not locked and Roy fell onto the right arm.  Roy reports associated swelling and bruising to the area but denies any weakness or numbness to the right arm. Roy rates his arm pain severe in severity. His arm pain [name] worse with movement. Has not taken anything for his pain.   The history [name] provided by the patient. No language interpreter was used.  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 08/22/2020   Pericardial effusion 08/22/2020   Acute on chronic diastolic CHF (congestive heart failure) ([place]) 08/22/2020   TIA (transient ischemic attack) 07/03/2020   Acute on chronic heart failure with preserved ejection fraction ([place])    Pulmonary artery hypertension ([place]) 06/25/2020   CKD (chronic kidney disease) 06/25/2020   History of cerebrovascular accident (CVA) with residual deficit 06/25/2020   CKD stage IIIa ([place]) 03/30/2020   Morbid (severe) obesity due to excess calories ([place]) 03/30/2020   Malnutrition of moderate degree 01/25/2020   Palliative care by specialist    Goals of care, counseling/discussion    Nephrotic syndrome 01/06/2020   Anasarca 01/06/2020   Hypokalemia due to loss of potassium 01/04/2020   Generalized  body aches 01/01/2020   Non-traumatic rhabdomyolysis 01/01/2020   Grade I diastolic dysfunction 40/98/1191   Microcytic anemia 01/01/2020   Hypokalemia 12/31/2019   Elevated CK    Chronic diastolic CHF (congestive heart failure) ([place]) 12/31/2017   Diabetes mellitus type 2, uncontrolled ([place])    Pure hypercholesterolemia    Hyperglycemia 09/28/2017   Nonobstructive atherosclerosis of coronary artery 09/28/2017   MDD (major depressive disorder), recurrent episode, severe ([place]) 07/30/2017   Osteoarthritis 04/12/2015   Severe recurrent major depression with psychotic features ([place]) 01/04/2015   Substance-related disorder ([place]) 10/30/2014   Patient's noncompliance with other medical treatment and regimen 06/07/2014   Cocaine dependence, uncomplicated ([place]) 47/82/9562   Chronic pain 05/31/2014   Cocaine abuse ([place]) 05/31/2014   History of noncompliance with medical treatment 05/31/2014   Malingering 05/31/2014   Leukopenia 05/31/2014   Diabetic neuropathy associated with diabetes mellitus due to underlying condition ([place]) 05/31/2014   Diabetic neuropathy ([place]) 05/31/2014   Congestive heart failure with left ventricular systolic dysfunction ([place]) 02/22/2014   Depression, major, recurrent, severe with psychosis ([place]) 08/29/2013   Suicidal ideations 08/28/2013   Uncontrolled diabetes mellitus ([place]) 06/30/2013   Atypical chest pain 12/09/2011   Essential hypertension 12/09/2011   Episodic mood disorder ([place]) 05/13/2011    Past Surgical History:  Procedure Laterality Date   BOWEL DECOMPRESSION N/A 01/19/2020   Procedure: BOWEL DECOMPRESSION;  Surgeon: Alessandra Bevels,  Orson Gear, MD;  Location: [facility];  Service: Gastroenterology;  Laterality: N/A;   COLONOSCOPY WITH PROPOFOL N/A 01/19/2020   Procedure: COLONOSCOPY WITH PROPOFOL;  Surgeon: Otis Brace, MD;  Location: [facility];  Service: Gastroenterology;  Laterality: N/A;   KNEE SURGERY     b/l knees; pending total knee     RADIOLOGY WITH ANESTHESIA N/A 07/04/2020   Procedure: MRI WITH ANESTHESIA BRAIN WITHOUT CONTRAST AND HEAD WITHOUT CONTRAST;  Surgeon: Radiologist, Medication, MD;  Location: [facility];  Service: Radiology;  Laterality: N/A;       Family History  Problem Relation Age of Onset   Diabetes Other    Cancer Mother        uterus; deceased    Heart attack Mother 46   Hypertension Other    Heart attack Father 26   Stroke Sister        12/2012 age 55 y.o    Social History   Tobacco Use   Smoking status: Former   Smokeless tobacco: Never  Scientific laboratory technician Use: Never used  Substance Use Topics   Alcohol use: No   Drug use: Not Currently    Types: Cocaine    Comment: Last use 2019    Home Medications Prior to Admission medications   Medication Sig Start Date End Date Taking? Authorizing Provider  amLODipine (NORVASC) 10 MG tablet Take 1 tablet (10 mg total) by mouth daily. 07/05/20 10/03/20  [place] Donnamarie Poag, DO  amLODipine (NORVASC) 10 MG tablet Take 10 mg by mouth daily.    [provider]  aspirin 81 MG chewable tablet Chew by mouth daily.    [provider]  atorvastatin (LIPITOR) 80 MG tablet Take 1 tablet (80 mg total) by mouth at bedtime. 07/05/20 10/03/20  [place] Donnamarie Poag, DO  atorvastatin (LIPITOR) 80 MG tablet Take 80 mg by mouth at bedtime.    [provider]  carvedilol (COREG) 12.5 MG tablet Take 1 tablet (12.5 mg total) by mouth 2 (two) times daily with a meal. 09/07/20 09/02/21  Antonieta Pert, MD  empagliflozin (JARDIANCE) 10 MG TABS tablet Take 1 tablet (10 mg total) by mouth daily. 09/23/20   [name], [name], DO  Ensure (ENSURE) Take 237 mLs by mouth 5 (five) times daily.    [provider]  ferrous sulfate 325 (65 FE) MG tablet Take 1 tablet (325 mg total) by mouth daily. 09/23/20   [name], Georgina Quint Latif, DO  insulin aspart (NOVOLOG) 100 UNIT/ML injection Inject 2-12 Units into the skin See admin instructions. Check and record blood sugar before  meals and inject per ssi. 151-200 = 2 units  201-250 = 4 units  251-300 = 6 units  301-350 = 8 units  351-400 = 10 units  greater than 400 = 12 units and call MD. 09/07/20   Antonieta Pert, MD  insulin glargine (LANTUS) 100 UNIT/ML injection Inject 0.15 mLs (15 Units total) into the skin at bedtime. 09/22/20   [name], [name], DO  isosorbide-hydrALAZINE (BIDIL) 20-37.5 MG tablet Take 1 tablet by mouth 3 (three) times daily. 09/07/20   Antonieta Pert, MD  loperamide (IMODIUM) 2 MG capsule Take 1 capsule (2 mg total) by mouth as needed for diarrhea or loose stools. 09/22/20   [name], [name], DO  NON FORMULARY Take 237 mLs by mouth in the morning and at bedtime. ensure drink    [provider]  ondansetron (ZOFRAN ODT) 4 MG disintegrating tablet 4mg  ODT q4 hours prn nausea/vomit Patient taking  differently: Take 4 mg by mouth every 4 (four) hours as needed for nausea or vomiting. 07/11/20   Drenda Freeze, MD  pantoprazole (PROTONIX) 40 MG tablet Take 40 mg by mouth daily.    [provider]  polyethylene glycol (MIRALAX / GLYCOLAX) 17 g packet Take 17 g by mouth daily as needed. Patient taking differently: Take 17 g by mouth daily as needed for mild constipation. 03/30/20   [name], NP  potassium chloride (KLOR-CON) 10 MEQ tablet Take 20 mEq by mouth 2 (two) times daily.    [provider]  potassium chloride SA (KLOR-CON) 20 MEQ tablet Take 1 tablet (20 mEq total) by mouth 2 (two) times daily. Patient not taking: Reported on 10/24/2020 09/07/20   Antonieta Pert, MD  pregabalin (LYRICA) 75 MG capsule Take 1 capsule (75 mg total) by mouth 2 (two) times daily. 07/05/20 10/03/20  [place] Donnamarie Poag, DO  pregabalin (LYRICA) 75 MG capsule Take 75 mg by mouth 2 (two) times daily.    [provider]  saccharomyces boulardii (FLORASTOR) 250 MG capsule Take 1 capsule (250 mg total) by mouth 2 (two) times daily. 09/07/20   Antonieta Pert, MD  sacubitril-valsartan (ENTRESTO) 49-51  MG Take 1 tablet by mouth 2 (two) times daily.    [provider]  senna-docusate (SENOKOT-S) 8.6-50 MG tablet Take 1 tablet by mouth daily.    [provider]  sertraline (ZOLOFT) 50 MG tablet Take 50 mg by mouth daily.    [provider]  tamsulosin (FLOMAX) 0.4 MG CAPS capsule Take 0.8 mg by mouth daily.    [provider]  torsemide (DEMADEX) 20 MG tablet Take 40 mg by mouth 2 (two) times daily.    [provider]  torsemide 40 MG TABS Take 40 mg by mouth 2 (two) times daily. Adjust Lasix dose based upon his weight and renal function in a week at [facility] 09/14/20 10/14/20  Antonieta Pert, MD  Vitamin D, Ergocalciferol, (DRISDOL) 1.25 MG (50000 UNIT) CAPS capsule Take 50,000 Units by mouth every 7 (seven) days.    [provider]    Allergies    Other  Review of Systems   Review of Systems  All other systems reviewed and are negative.  Physical Exam Updated Vital Signs BP (!) 164/91   Pulse 82   Temp 97.6 F (36.4 C) (Oral)   Resp (!) 22   SpO2 98%   Physical Exam Vitals reviewed.  Constitutional:      Appearance: Normal appearance.  HENT:     Head: Normocephalic and atraumatic.  Eyes:     General:        Right eye: No discharge.        Left eye: No discharge.     Conjunctiva/sclera: Conjunctivae normal.  Pulmonary:     Effort: Pulmonary effort [name] normal.  Musculoskeletal:     Comments: Patient has limited range of motion to the right shoulder, elbow, and wrist secondary to pain.  [name] neurovascularly intact at the hand.  Compartments are soft.  Mild amount of swelling to the right hand.  Skin:    General: Skin [name] warm and dry.     Findings: No rash.  Neurological:     General: No focal deficit present.     Mental Status: [name] alert.  Psychiatric:        Mood and Affect: Mood normal.        Behavior: Behavior normal.    ED  Results / Procedures / Treatments   Labs (all labs ordered are listed, but only abnormal  results are displayed) Labs Reviewed - No data to display  EKG None  Radiology DG Elbow Complete Right  Result Date: 11/15/2020 CLINICAL DATA:  Status post fall. EXAM: RIGHT ELBOW - COMPLETE 3+ VIEW COMPARISON:  None. FINDINGS: The study [name] limited secondary to difficult patient positioning. There [name] no evidence of fracture, dislocation, or joint effusion. There [name] no evidence of arthropathy or other focal bone abnormality. Mild dorsal lateral soft tissue swelling [name] seen. IMPRESSION: Mild dorsal lateral soft tissue swelling without evidence of acute fracture or dislocation. Electronically Signed   By: Virgina Norfolk M.D.   On: 11/15/2020 23:16   DG Wrist Complete Right  Result Date: 11/15/2020 CLINICAL DATA:  Status post fall. EXAM: RIGHT WRIST - COMPLETE 3+ VIEW COMPARISON:  None. FINDINGS: There [name] no evidence of fracture or dislocation. There [name] no evidence of arthropathy or other focal bone abnormality. There [name] moderate severity vascular calcification. Mild dorsal soft tissue swelling [name] seen. IMPRESSION: 1. Mild dorsal soft tissue swelling without an acute osseous abnormality. Electronically Signed   By: Virgina Norfolk M.D.   On: 11/15/2020 23:14   DG Shoulder Right Port  Result Date: 11/15/2020 CLINICAL DATA:  Pain after a fall. EXAM: PORTABLE RIGHT SHOULDER COMPARISON:  None. FINDINGS: Degenerative changes in the glenohumeral joint. No acute fracture or dislocation. Coracoclavicular and acromioclavicular spaces are maintained. Soft tissues are unremarkable. IMPRESSION: Degenerative changes.  No acute displaced fractures identified. Electronically Signed   By: Lucienne Capers M.D.   On: 11/15/2020 19:22   DG Hand Complete Right  Result Date: 11/15/2020 CLINICAL DATA:  Injury from a fall.  Right hand pain. EXAM: RIGHT HAND - COMPLETE 3+ VIEW COMPARISON:  None. FINDINGS: Mild degenerative changes in the interphalangeal joints. No evidence of acute fracture or dislocation. No focal  bone lesions. Dorsal soft tissue swelling over the right hand. No radiopaque foreign bodies. Vascular calcifications. IMPRESSION: Degenerative changes.  Soft tissue swelling.  No acute fractures. Electronically Signed   By: Lucienne Capers M.D.   On: 11/15/2020 19:21    Procedures Procedures   Medications Ordered in ED Medications  oxyCODONE-acetaminophen (PERCOCET/ROXICET) 5-325 MG per tablet 1 tablet (1 tablet Oral Given 11/15/20 2305)    ED Course  I have reviewed the triage vital signs and the nursing notes.  Pertinent labs & imaging results that were available during my care of the patient were reviewed by me and considered in my medical decision making (see chart for details).    MDM Rules/Calculators/A&P                          CHESKEL SILVERIO [name] a 60 y.o. male who presents to the emergency department today for further evaluation of right arm pain.  History and physical exam [name] less concerning for compartment syndrome at this time.  Imaging of the right shoulder, elbow, wrist, and hand were all negative for fractures.  There [name] likely soft tissue contusion from the fall.  I stressed to the patient that Roy will be sore in the coming days.  Tylenol/ibuprofen for pain. [name] neurovascularly intact on reevaluation and was feeling improved. Pain was controlled with Percocet.  Strict return precautions given.  [name] stable and safe for discharge.   Final Clinical Impression(s) / ED Diagnoses Final diagnoses:  Pain  Pain of right upper extremity  Rx / DC Orders ED Discharge Orders     None        Cherrie Gauze 11/15/20 2346    Teressa Lower, MD 11/15/20 2358

## 2020-11-16 DIAGNOSIS — F319 Bipolar disorder, unspecified: Secondary | ICD-10-CM | POA: Diagnosis present

## 2020-11-16 DIAGNOSIS — R140 Abdominal distension (gaseous): Secondary | ICD-10-CM | POA: Diagnosis not present

## 2020-11-16 DIAGNOSIS — R2689 Other abnormalities of gait and mobility: Secondary | ICD-10-CM | POA: Diagnosis not present

## 2020-11-16 DIAGNOSIS — R1312 Dysphagia, oropharyngeal phase: Secondary | ICD-10-CM | POA: Diagnosis not present

## 2020-11-16 DIAGNOSIS — N179 Acute kidney failure, unspecified: Secondary | ICD-10-CM | POA: Diagnosis present

## 2020-11-16 DIAGNOSIS — K5900 Constipation, unspecified: Secondary | ICD-10-CM | POA: Diagnosis not present

## 2020-11-16 DIAGNOSIS — R404 Transient alteration of awareness: Secondary | ICD-10-CM | POA: Diagnosis not present

## 2020-11-16 DIAGNOSIS — E1122 Type 2 diabetes mellitus with diabetic chronic kidney disease: Secondary | ICD-10-CM | POA: Diagnosis present

## 2020-11-16 DIAGNOSIS — E876 Hypokalemia: Secondary | ICD-10-CM | POA: Diagnosis present

## 2020-11-16 DIAGNOSIS — N1831 Chronic kidney disease, stage 3a: Secondary | ICD-10-CM | POA: Diagnosis present

## 2020-11-16 DIAGNOSIS — X58XXXA Exposure to other specified factors, initial encounter: Secondary | ICD-10-CM | POA: Diagnosis present

## 2020-11-16 DIAGNOSIS — N400 Enlarged prostate without lower urinary tract symptoms: Secondary | ICD-10-CM | POA: Diagnosis present

## 2020-11-16 DIAGNOSIS — M6281 Muscle weakness (generalized): Secondary | ICD-10-CM | POA: Diagnosis not present

## 2020-11-16 DIAGNOSIS — R1084 Generalized abdominal pain: Secondary | ICD-10-CM | POA: Diagnosis not present

## 2020-11-16 DIAGNOSIS — I69351 Hemiplegia and hemiparesis following cerebral infarction affecting right dominant side: Secondary | ICD-10-CM | POA: Diagnosis not present

## 2020-11-16 DIAGNOSIS — W19XXXA Unspecified fall, initial encounter: Secondary | ICD-10-CM | POA: Diagnosis not present

## 2020-11-16 DIAGNOSIS — Z743 Need for continuous supervision: Secondary | ICD-10-CM | POA: Diagnosis not present

## 2020-11-16 DIAGNOSIS — I517 Cardiomegaly: Secondary | ICD-10-CM | POA: Diagnosis not present

## 2020-11-16 DIAGNOSIS — K5981 Ogilvie syndrome: Secondary | ICD-10-CM | POA: Diagnosis present

## 2020-11-16 DIAGNOSIS — R100 Acute abdomen: Secondary | ICD-10-CM | POA: Diagnosis not present

## 2020-11-16 DIAGNOSIS — R109 Unspecified abdominal pain: Secondary | ICD-10-CM | POA: Diagnosis not present

## 2020-11-16 DIAGNOSIS — K567 Ileus, unspecified: Secondary | ICD-10-CM | POA: Diagnosis present

## 2020-11-16 DIAGNOSIS — D696 Thrombocytopenia, unspecified: Secondary | ICD-10-CM | POA: Diagnosis present

## 2020-11-16 DIAGNOSIS — E871 Hypo-osmolality and hyponatremia: Secondary | ICD-10-CM | POA: Diagnosis not present

## 2020-11-16 DIAGNOSIS — Z7401 Bed confinement status: Secondary | ICD-10-CM | POA: Diagnosis not present

## 2020-11-16 DIAGNOSIS — I3139 Other pericardial effusion (noninflammatory): Secondary | ICD-10-CM | POA: Diagnosis present

## 2020-11-16 DIAGNOSIS — E870 Hyperosmolality and hypernatremia: Secondary | ICD-10-CM | POA: Diagnosis not present

## 2020-11-16 DIAGNOSIS — R0602 Shortness of breath: Secondary | ICD-10-CM | POA: Diagnosis not present

## 2020-11-16 DIAGNOSIS — D631 Anemia in chronic kidney disease: Secondary | ICD-10-CM | POA: Diagnosis present

## 2020-11-16 DIAGNOSIS — E1140 Type 2 diabetes mellitus with diabetic neuropathy, unspecified: Secondary | ICD-10-CM | POA: Diagnosis present

## 2020-11-16 DIAGNOSIS — R112 Nausea with vomiting, unspecified: Secondary | ICD-10-CM | POA: Diagnosis not present

## 2020-11-16 DIAGNOSIS — R41841 Cognitive communication deficit: Secondary | ICD-10-CM | POA: Diagnosis not present

## 2020-11-16 DIAGNOSIS — G459 Transient cerebral ischemic attack, unspecified: Secondary | ICD-10-CM | POA: Diagnosis not present

## 2020-11-16 DIAGNOSIS — Z6841 Body Mass Index (BMI) 40.0 and over, adult: Secondary | ICD-10-CM | POA: Diagnosis not present

## 2020-11-16 DIAGNOSIS — I959 Hypotension, unspecified: Secondary | ICD-10-CM | POA: Diagnosis present

## 2020-11-16 DIAGNOSIS — I10 Essential (primary) hypertension: Secondary | ICD-10-CM | POA: Diagnosis not present

## 2020-11-16 DIAGNOSIS — I130 Hypertensive heart and chronic kidney disease with heart failure and stage 1 through stage 4 chronic kidney disease, or unspecified chronic kidney disease: Secondary | ICD-10-CM | POA: Diagnosis present

## 2020-11-16 DIAGNOSIS — D509 Iron deficiency anemia, unspecified: Secondary | ICD-10-CM | POA: Diagnosis present

## 2020-11-16 DIAGNOSIS — R509 Fever, unspecified: Secondary | ICD-10-CM | POA: Diagnosis not present

## 2020-11-16 DIAGNOSIS — K8020 Calculus of gallbladder without cholecystitis without obstruction: Secondary | ICD-10-CM | POA: Diagnosis not present

## 2020-11-16 DIAGNOSIS — K219 Gastro-esophageal reflux disease without esophagitis: Secondary | ICD-10-CM | POA: Diagnosis not present

## 2020-11-16 DIAGNOSIS — I5030 Unspecified diastolic (congestive) heart failure: Secondary | ICD-10-CM | POA: Diagnosis not present

## 2020-11-16 DIAGNOSIS — I5032 Chronic diastolic (congestive) heart failure: Secondary | ICD-10-CM | POA: Diagnosis present

## 2020-11-16 DIAGNOSIS — K560 Paralytic ileus: Secondary | ICD-10-CM | POA: Diagnosis present

## 2020-11-16 DIAGNOSIS — M79603 Pain in arm, unspecified: Secondary | ICD-10-CM | POA: Diagnosis not present

## 2020-11-16 DIAGNOSIS — Q438 Other specified congenital malformations of intestine: Secondary | ICD-10-CM | POA: Diagnosis not present

## 2020-11-16 DIAGNOSIS — Z20822 Contact with and (suspected) exposure to covid-19: Secondary | ICD-10-CM | POA: Diagnosis present

## 2020-11-16 DIAGNOSIS — I700 Atherosclerosis of aorta: Secondary | ICD-10-CM | POA: Diagnosis not present

## 2020-11-16 DIAGNOSIS — I2510 Atherosclerotic heart disease of native coronary artery without angina pectoris: Secondary | ICD-10-CM | POA: Diagnosis not present

## 2020-11-17 ENCOUNTER — Encounter (HOSPITAL_COMMUNITY): Payer: Self-pay

## 2020-11-17 ENCOUNTER — Emergency Department (HOSPITAL_COMMUNITY): Payer: Medicare HMO

## 2020-11-17 ENCOUNTER — Emergency Department (HOSPITAL_COMMUNITY)
Admission: EM | Admit: 2020-11-17 | Discharge: 2020-11-18 | Disposition: A | Discharge status: Home / Self Care | Payer: Medicare HMO | Source: Home / Self Care | Attending: Emergency Medicine | Admitting: Emergency Medicine

## 2020-11-17 ENCOUNTER — Other Ambulatory Visit: Payer: Self-pay

## 2020-11-17 DIAGNOSIS — I5033 Acute on chronic diastolic (congestive) heart failure: Secondary | ICD-10-CM | POA: Insufficient documentation

## 2020-11-17 DIAGNOSIS — Z87891 Personal history of nicotine dependence: Secondary | ICD-10-CM | POA: Insufficient documentation

## 2020-11-17 DIAGNOSIS — I10 Essential (primary) hypertension: Secondary | ICD-10-CM | POA: Diagnosis not present

## 2020-11-17 DIAGNOSIS — M7989 Other specified soft tissue disorders: Secondary | ICD-10-CM | POA: Insufficient documentation

## 2020-11-17 DIAGNOSIS — K5981 Ogilvie syndrome: Secondary | ICD-10-CM | POA: Diagnosis not present

## 2020-11-17 DIAGNOSIS — K567 Ileus, unspecified: Secondary | ICD-10-CM | POA: Diagnosis not present

## 2020-11-17 DIAGNOSIS — I130 Hypertensive heart and chronic kidney disease with heart failure and stage 1 through stage 4 chronic kidney disease, or unspecified chronic kidney disease: Secondary | ICD-10-CM | POA: Insufficient documentation

## 2020-11-17 DIAGNOSIS — K5900 Constipation, unspecified: Secondary | ICD-10-CM

## 2020-11-17 DIAGNOSIS — Z79899 Other long term (current) drug therapy: Secondary | ICD-10-CM | POA: Insufficient documentation

## 2020-11-17 DIAGNOSIS — R100 Acute abdomen: Secondary | ICD-10-CM | POA: Diagnosis not present

## 2020-11-17 DIAGNOSIS — K6389 Other specified diseases of intestine: Secondary | ICD-10-CM | POA: Diagnosis not present

## 2020-11-17 DIAGNOSIS — R0602 Shortness of breath: Secondary | ICD-10-CM | POA: Diagnosis not present

## 2020-11-17 DIAGNOSIS — Z7984 Long term (current) use of oral hypoglycemic drugs: Secondary | ICD-10-CM | POA: Insufficient documentation

## 2020-11-17 DIAGNOSIS — R140 Abdominal distension (gaseous): Secondary | ICD-10-CM

## 2020-11-17 DIAGNOSIS — E1140 Type 2 diabetes mellitus with diabetic neuropathy, unspecified: Secondary | ICD-10-CM | POA: Insufficient documentation

## 2020-11-17 DIAGNOSIS — E1122 Type 2 diabetes mellitus with diabetic chronic kidney disease: Secondary | ICD-10-CM | POA: Insufficient documentation

## 2020-11-17 DIAGNOSIS — W19XXXA Unspecified fall, initial encounter: Secondary | ICD-10-CM | POA: Diagnosis not present

## 2020-11-17 DIAGNOSIS — Z7982 Long term (current) use of aspirin: Secondary | ICD-10-CM | POA: Insufficient documentation

## 2020-11-17 DIAGNOSIS — R112 Nausea with vomiting, unspecified: Secondary | ICD-10-CM | POA: Diagnosis not present

## 2020-11-17 DIAGNOSIS — N1831 Chronic kidney disease, stage 3a: Secondary | ICD-10-CM | POA: Insufficient documentation

## 2020-11-17 DIAGNOSIS — Z794 Long term (current) use of insulin: Secondary | ICD-10-CM | POA: Insufficient documentation

## 2020-11-17 DIAGNOSIS — R509 Fever, unspecified: Secondary | ICD-10-CM | POA: Diagnosis not present

## 2020-11-17 DIAGNOSIS — R109 Unspecified abdominal pain: Secondary | ICD-10-CM | POA: Diagnosis not present

## 2020-11-17 DIAGNOSIS — R1084 Generalized abdominal pain: Secondary | ICD-10-CM | POA: Diagnosis not present

## 2020-11-17 DIAGNOSIS — K8020 Calculus of gallbladder without cholecystitis without obstruction: Secondary | ICD-10-CM | POA: Diagnosis not present

## 2020-11-17 DIAGNOSIS — Z743 Need for continuous supervision: Secondary | ICD-10-CM | POA: Diagnosis not present

## 2020-11-17 DIAGNOSIS — Z20822 Contact with and (suspected) exposure to covid-19: Secondary | ICD-10-CM | POA: Insufficient documentation

## 2020-11-17 DIAGNOSIS — I517 Cardiomegaly: Secondary | ICD-10-CM | POA: Diagnosis not present

## 2020-11-17 DIAGNOSIS — I700 Atherosclerosis of aorta: Secondary | ICD-10-CM | POA: Diagnosis not present

## 2020-11-17 LAB — LIPASE, BLOOD: Lipase: 20 U/L (ref 11–51)

## 2020-11-17 LAB — COMPREHENSIVE METABOLIC PANEL
ALT: 13 U/L (ref 0–44)
AST: 15 U/L (ref 15–41)
Albumin: 3.8 g/dL (ref 3.5–5.0)
Alkaline Phosphatase: 73 U/L (ref 38–126)
Anion gap: 9 (ref 5–15)
BUN: 40 mg/dL — ABNORMAL HIGH (ref 6–20)
CO2: 24 mmol/L (ref 22–32)
Calcium: 9.5 mg/dL (ref 8.9–10.3)
Chloride: 110 mmol/L (ref 98–111)
Creatinine, Ser: 1.25 mg/dL — ABNORMAL HIGH (ref 0.61–1.24)
GFR, Estimated: >60 mL/min (ref >60)
Glucose, Bld: 159 mg/dL — ABNORMAL HIGH (ref 70–99)
Potassium: 3 mmol/L — ABNORMAL LOW (ref 3.5–5.1)
Sodium: 143 mmol/L (ref 135–145)
Total Bilirubin: 1.1 mg/dL (ref 0.3–1.2)
Total Protein: 7.8 g/dL (ref 6.5–8.1)

## 2020-11-17 LAB — CBC
HCT: 34 % — ABNORMAL LOW (ref 39.0–52.0)
Hemoglobin: 10.6 g/dL — ABNORMAL LOW (ref 13.0–17.0)
MCH: 24.6 pg — ABNORMAL LOW (ref 26.0–34.0)
MCHC: 31.2 g/dL (ref 30.0–36.0)
MCV: 78.9 fL — ABNORMAL LOW (ref 80.0–100.0)
Platelets: 188 10*3/uL (ref 150–400)
RBC: 4.31 MIL/uL (ref 4.22–5.81)
RDW: 16.8 % — ABNORMAL HIGH (ref 11.5–15.5)
WBC: 4.5 10*3/uL (ref 4.0–10.5)
nRBC: 0 % (ref 0.0–0.2)

## 2020-11-17 LAB — RESP PANEL BY RT-PCR (FLU A&B, COVID) ARPGX2
Influenza A by PCR: NEGATIVE
Influenza B by PCR: NEGATIVE
SARS Coronavirus 2 by RT PCR: NEGATIVE

## 2020-11-17 MED ORDER — POTASSIUM CHLORIDE CRYS ER 20 MEQ PO TBCR
40.0000 meq | EXTENDED_RELEASE_TABLET | Freq: Once | ORAL | Status: AC
  Administered 2020-11-18: 40 meq via ORAL
  Filled 2020-11-17: qty 2

## 2020-11-17 MED ORDER — MORPHINE SULFATE (PF) 4 MG/ML IV SOLN
4.0000 mg | Freq: Once | INTRAVENOUS | Status: AC
  Administered 2020-11-17: 4 mg via INTRAVENOUS
  Filled 2020-11-17: qty 1

## 2020-11-17 MED ORDER — ONDANSETRON HCL 4 MG/2ML IJ SOLN
4.0000 mg | Freq: Once | INTRAMUSCULAR | Status: AC
  Administered 2020-11-17: 4 mg via INTRAVENOUS
  Filled 2020-11-17: qty 2

## 2020-11-17 MED ORDER — IOHEXOL 350 MG/ML SOLN
80.0000 mL | Freq: Once | INTRAVENOUS | Status: AC | PRN
  Administered 2020-11-17: 80 mL via INTRAVENOUS

## 2020-11-17 NOTE — ED Provider Notes (Signed)
[facility] DEPT Provider Note   CSN: [id number] Arrival date & time: 11/17/20  1716     History Chief Complaint  Patient presents with   Abdominal Pain    [name] is a 60 y.o. male.  Patient with prior medical history significant for uncontrolled type II diabetes, CKD, chronic diastolic CHF with pericardial effusion, HTN, aortic stenosis -- who presents today for evaluation of 1 week of abdominal distention. Patient reports that over the last few days his abdomen has continued to "swell". He describes the pain as a "constant hurt" and rates it as a 9/10 pain. He also endorses a decrease in appetite with associated nausea and vomiting. Last episode of vomiting earlier this morning. He has only eaten fruit cocktail this week. Last bowel movement was 4-5 days ago. He usually has one bowel movement each day. His facility has given him a stool softener with no relief. Denies urinary frequency or pain with urination. He also reports cold chills. He denies chest pain, shortness of breath, or lightheadedness. Endorses hx of similar episode "a long time ago" and can only recall being treated with an NG tube for one month with fluid restriction.  EMS reported fever in route.   Per MAR, patient is given senna daily and occasional doses of MiraLAX.      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 08/22/2020   Pericardial effusion 08/22/2020   Acute on chronic diastolic CHF (congestive heart failure) ([place]) 08/22/2020   TIA (transient ischemic attack) 07/03/2020   Acute on chronic heart failure with preserved ejection fraction  ([place])    Pulmonary artery hypertension ([place]) 06/25/2020   CKD (chronic kidney disease) 06/25/2020   History of cerebrovascular accident (CVA) with residual deficit 06/25/2020   CKD stage IIIa ([place]) 03/30/2020   Morbid (severe) obesity due to excess calories ([place]) 03/30/2020   Malnutrition of moderate degree 01/25/2020   Palliative care by specialist    Goals of care, counseling/discussion    Nephrotic syndrome 01/06/2020   Anasarca 01/06/2020   Hypokalemia due to loss of potassium 01/04/2020   Generalized body aches 01/01/2020   Non-traumatic rhabdomyolysis 01/01/2020   Grade I diastolic dysfunction 85/63/1497   Microcytic anemia 01/01/2020   Hypokalemia 12/31/2019   Elevated CK    Chronic diastolic CHF (congestive heart failure) ([place]) 12/31/2017   Diabetes mellitus type 2, uncontrolled ([place])    Pure hypercholesterolemia    Hyperglycemia 09/28/2017   Nonobstructive atherosclerosis of coronary artery 09/28/2017   MDD (major depressive disorder), recurrent episode, severe ([place]) 07/30/2017   Osteoarthritis 04/12/2015   Severe recurrent major depression with psychotic features ([place]) 01/04/2015   Substance-related disorder ([place]) 10/30/2014   Patient's noncompliance with other medical treatment and regimen 06/07/2014   Cocaine dependence, uncomplicated ([place]) 02/63/7858   Chronic pain 05/31/2014   Cocaine abuse ([place]) 05/31/2014   History of noncompliance with medical treatment 05/31/2014   Malingering 05/31/2014   Leukopenia 05/31/2014   Diabetic neuropathy associated with diabetes mellitus due to underlying condition ([place]) 05/31/2014   Diabetic neuropathy ([place]) 05/31/2014  Congestive heart failure with left ventricular systolic dysfunction ([place]) 02/22/2014   Depression, major, recurrent, severe with psychosis ([place]) 08/29/2013   Suicidal ideations 08/28/2013   Uncontrolled diabetes mellitus ([place]) 06/30/2013   Atypical chest pain 12/09/2011   Essential hypertension 12/09/2011    Episodic mood disorder ([place]) 05/13/2011    Past Surgical History:  Procedure Laterality Date   BOWEL DECOMPRESSION N/A 01/19/2020   Procedure: BOWEL DECOMPRESSION;  Surgeon: Otis Brace, MD;  Location: [facility];  Service: Gastroenterology;  Laterality: N/A;   COLONOSCOPY WITH PROPOFOL N/A 01/19/2020   Procedure: COLONOSCOPY WITH PROPOFOL;  Surgeon: Otis Brace, MD;  Location: [facility];  Service: Gastroenterology;  Laterality: N/A;   KNEE SURGERY     b/l knees; pending total knee    RADIOLOGY WITH ANESTHESIA N/A 07/04/2020   Procedure: MRI WITH ANESTHESIA BRAIN WITHOUT CONTRAST AND HEAD WITHOUT CONTRAST;  Surgeon: Radiologist, Medication, MD;  Location: [facility];  Service: Radiology;  Laterality: N/A;       Family History  Problem Relation Age of Onset   Diabetes Other    Cancer Mother        uterus; deceased    Heart attack Mother 50   Hypertension Other    Heart attack Father 88   Stroke Sister        12/2012 age 57 y.o    Social History   Tobacco Use   Smoking status: Former   Smokeless tobacco: Never  Scientific laboratory technician Use: Never used  Substance Use Topics   Alcohol use: No   Drug use: Not Currently    Types: Cocaine    Comment: Last use 2019    Home Medications Prior to Admission medications   Medication Sig Start Date End Date Taking? Authorizing Provider  amLODipine (NORVASC) 10 MG tablet Take 1 tablet (10 mg total) by mouth daily. 07/05/20 10/03/20  [place] Donnamarie Poag, DO  amLODipine (NORVASC) 10 MG tablet Take 10 mg by mouth daily.    [provider]  aspirin 81 MG chewable tablet Chew by mouth daily.    [provider]  atorvastatin (LIPITOR) 80 MG tablet Take 1 tablet (80 mg total) by mouth at bedtime. 07/05/20 10/03/20  [place] Donnamarie Poag, DO  atorvastatin (LIPITOR) 80 MG tablet Take 80 mg by mouth at bedtime.    [provider]  carvedilol (COREG) 12.5 MG tablet Take 1 tablet (12.5 mg total) by mouth 2 (two) times daily  with a meal. 09/07/20 09/02/21  Antonieta Pert, MD  empagliflozin (JARDIANCE) 10 MG TABS tablet Take 1 tablet (10 mg total) by mouth daily. 09/23/20   [name], [name], DO  Ensure (ENSURE) Take 237 mLs by mouth 5 (five) times daily.    [provider]  ferrous sulfate 325 (65 FE) MG tablet Take 1 tablet (325 mg total) by mouth daily. 09/23/20   [name], Georgina Quint Latif, DO  insulin aspart (NOVOLOG) 100 UNIT/ML injection Inject 2-12 Units into the skin See admin instructions. Check and record blood sugar before meals and inject per ssi. 151-200 = 2 units  201-250 = 4 units  251-300 = 6 units  301-350 = 8 units  351-400 = 10 units  greater than 400 = 12 units and call MD. 09/07/20   Antonieta Pert, MD  insulin glargine (LANTUS) 100 UNIT/ML injection Inject 0.15 mLs (15 Units total) into the skin at bedtime. 09/22/20   [name], [name], DO  isosorbide-hydrALAZINE (BIDIL) 20-37.5 MG tablet Take 1 tablet by mouth 3 (three)  times daily. 09/07/20   Antonieta Pert, MD  loperamide (IMODIUM) 2 MG capsule Take 1 capsule (2 mg total) by mouth as needed for diarrhea or loose stools. 09/22/20   [name], [name], DO  NON FORMULARY Take 237 mLs by mouth in the morning and at bedtime. ensure drink    [provider]  ondansetron (ZOFRAN ODT) 4 MG disintegrating tablet 4mg  ODT q4 hours prn nausea/vomit Patient taking differently: Take 4 mg by mouth every 4 (four) hours as needed for nausea or vomiting. 07/11/20   Drenda Freeze, MD  pantoprazole (PROTONIX) 40 MG tablet Take 40 mg by mouth daily.    [provider]  polyethylene glycol (MIRALAX / GLYCOLAX) 17 g packet Take 17 g by mouth daily as needed. Patient taking differently: Take 17 g by mouth daily as needed for mild constipation. 03/30/20   [name], NP  potassium chloride (KLOR-CON) 10 MEQ tablet Take 20 mEq by mouth 2 (two) times daily.    [provider]  potassium chloride SA (KLOR-CON) 20 MEQ tablet Take 1 tablet (20 mEq  total) by mouth 2 (two) times daily. Patient not taking: Reported on 10/24/2020 09/07/20   Antonieta Pert, MD  pregabalin (LYRICA) 75 MG capsule Take 1 capsule (75 mg total) by mouth 2 (two) times daily. 07/05/20 10/03/20  [place] Donnamarie Poag, DO  pregabalin (LYRICA) 75 MG capsule Take 75 mg by mouth 2 (two) times daily.    [provider]  saccharomyces boulardii (FLORASTOR) 250 MG capsule Take 1 capsule (250 mg total) by mouth 2 (two) times daily. 09/07/20   Antonieta Pert, MD  sacubitril-valsartan (ENTRESTO) 49-51 MG Take 1 tablet by mouth 2 (two) times daily.    [provider]  senna-docusate (SENOKOT-S) 8.6-50 MG tablet Take 1 tablet by mouth daily.    [provider]  sertraline (ZOLOFT) 50 MG tablet Take 50 mg by mouth daily.    [provider]  tamsulosin (FLOMAX) 0.4 MG CAPS capsule Take 0.8 mg by mouth daily.    [provider]  torsemide (DEMADEX) 20 MG tablet Take 40 mg by mouth 2 (two) times daily.    [provider]  torsemide 40 MG TABS Take 40 mg by mouth 2 (two) times daily. Adjust Lasix dose based upon his weight and renal function in a week at [facility] 09/14/20 10/14/20  Antonieta Pert, MD  Vitamin D, Ergocalciferol, (DRISDOL) 1.25 MG (50000 UNIT) CAPS capsule Take 50,000 Units by mouth every 7 (seven) days.    [provider]    Allergies    Other  Review of Systems   Review of Systems  Constitutional:  Positive for appetite change. Negative for fever.  HENT:  Negative for rhinorrhea and sore throat.   Eyes:  Negative for redness.  Respiratory:  Positive for shortness of breath. Negative for cough.   Cardiovascular:  Positive for leg swelling. Negative for chest pain.  Gastrointestinal:  Positive for abdominal distention, abdominal pain, constipation, nausea and vomiting. Negative for blood in stool and diarrhea.  Genitourinary:  Negative for dysuria and hematuria.  Musculoskeletal:  Negative for myalgias.  Skin:  Negative for rash.   Neurological:  Negative for headaches.   Physical Exam Updated Vital Signs BP (!) 190/95 (BP Location: Left Arm)   Pulse 86   Temp 97.9 F (36.6 C) (Oral)   Resp 18   SpO2 100%   Physical Exam Vitals and nursing note reviewed.  Constitutional:      General:  He is not in acute distress.    Appearance: He is well-developed.  HENT:     Head: Normocephalic and atraumatic.  Eyes:     General:        Right eye: No discharge.        Left eye: No discharge.     Conjunctiva/sclera: Conjunctivae normal.  Cardiovascular:     Rate and Rhythm: Normal rate and regular rhythm.     Heart sounds: Normal heart sounds.  Pulmonary:     Effort: Pulmonary effort is normal.     Breath sounds: Normal breath sounds.  Abdominal:     General: Abdomen is protuberant. There is distension.     Palpations: Abdomen is soft.     Tenderness: There is generalized abdominal tenderness. There is no guarding or rebound.  Genitourinary:    Testes:        Right: Swelling present.        Left: Swelling present.  Musculoskeletal:     Cervical back: Normal range of motion and neck supple.  Skin:    General: Skin is warm and dry.     Comments: Thickened, dry and cracking skin on the lower extremities bilaterally.  No evidence of superinfection.  Neurological:     Mental Status: He is alert.    ED Results / Procedures / Treatments   Labs (all labs ordered are listed, but only abnormal results are displayed) Labs Reviewed  COMPREHENSIVE METABOLIC PANEL - Abnormal; Notable for the following components:      Result Value   Potassium 3.0 (*)    Glucose, Bld 159 (*)    BUN 40 (*)    Creatinine, Ser 1.25 (*)    All other components within normal limits  CBC - Abnormal; Notable for the following components:   Hemoglobin 10.6 (*)    HCT 34.0 (*)    MCV 78.9 (*)    MCH 24.6 (*)    RDW 16.8 (*)    All other components within normal limits  RESP PANEL BY RT-PCR (FLU A&B, COVID) ARPGX2  LIPASE, BLOOD   URINALYSIS, ROUTINE W REFLEX MICROSCOPIC    EKG None  Radiology DG Elbow Complete Right  Result Date: 11/15/2020 CLINICAL DATA:  Status post fall. EXAM: RIGHT ELBOW - COMPLETE 3+ VIEW COMPARISON:  None. FINDINGS: The study is limited secondary to difficult patient positioning. There is no evidence of fracture, dislocation, or joint effusion. There is no evidence of arthropathy or other focal bone abnormality. Mild dorsal lateral soft tissue swelling is seen. IMPRESSION: Mild dorsal lateral soft tissue swelling without evidence of acute fracture or dislocation. Electronically Signed   By: Virgina Norfolk M.D.   On: 11/15/2020 23:16   DG Wrist Complete Right  Result Date: 11/15/2020 CLINICAL DATA:  Status post fall. EXAM: RIGHT WRIST - COMPLETE 3+ VIEW COMPARISON:  None. FINDINGS: There is no evidence of fracture or dislocation. There is no evidence of arthropathy or other focal bone abnormality. There is moderate severity vascular calcification. Mild dorsal soft tissue swelling is seen. IMPRESSION: 1. Mild dorsal soft tissue swelling without an acute osseous abnormality. Electronically Signed   By: Virgina Norfolk M.D.   On: 11/15/2020 23:14   DG Shoulder Right Port  Result Date: 11/15/2020 CLINICAL DATA:  Pain after a fall. EXAM: PORTABLE RIGHT SHOULDER COMPARISON:  None. FINDINGS: Degenerative changes in the glenohumeral joint. No acute fracture or dislocation. Coracoclavicular and acromioclavicular spaces are maintained. Soft tissues are unremarkable. IMPRESSION: Degenerative changes.  No acute  displaced fractures identified. Electronically Signed   By: Lucienne Capers M.D.   On: 11/15/2020 19:22   DG Hand Complete Right  Result Date: 11/15/2020 CLINICAL DATA:  Injury from a fall.  Right hand pain. EXAM: RIGHT HAND - COMPLETE 3+ VIEW COMPARISON:  None. FINDINGS: Mild degenerative changes in the interphalangeal joints. No evidence of acute fracture or dislocation. No focal bone lesions.  Dorsal soft tissue swelling over the right hand. No radiopaque foreign bodies. Vascular calcifications. IMPRESSION: Degenerative changes.  Soft tissue swelling.  No acute fractures. Electronically Signed   By: Lucienne Capers M.D.   On: 11/15/2020 19:21    Procedures Procedures   Medications Ordered in ED Medications  potassium chloride SA (KLOR-CON) CR tablet 40 mEq (has no administration in time range)  ondansetron (ZOFRAN) injection 4 mg (4 mg Intravenous Given 11/17/20 1839)  morphine 4 MG/ML injection 4 mg (4 mg Intravenous Given 11/17/20 1844)  iohexol (OMNIPAQUE) 350 MG/ML injection 80 mL (80 mLs Intravenous Contrast Given 11/17/20 1948)    ED Course  I have reviewed the triage vital signs and the nursing notes.  Pertinent labs & imaging results that were available during my care of the patient were reviewed by me and considered in my medical decision making (see chart for details).  Patient seen and examined. Work-up initiated.  Seen in the ED 2 days ago for injuries after a fall out of the bed.  X-rays were negative.  He did not complain of abdominal symptoms per records at that time.  Will check rectal temp as patient feels warm.   Vital signs reviewed and are as follows: BP (!) 190/95 (BP Location: Left Arm)   Pulse 86   Temp 97.9 F (36.6 C) (Oral)   Resp 18   SpO2 100%   Rectal temp 99.2 degrees Fahrenheit.  9:22 PM patient stable during ED stay.  He reports having a bowel movement while here.  CT scan with a large amount of gaseous distention of the large bowel with stool within the rectum.  Otherwise CT is reassuring.  Given that patient has had a bowel movement, will encourage continued laxatives until bowel movements have returned to normal.  Potassium found to be low, he is on supplementation as well, will give additional supplementation today.  The patient was urged to return to the Emergency Department immediately with worsening of current symptoms, worsening  abdominal pain, persistent vomiting, blood noted in stools, fever, or any other concerns. The patient verbalized understanding.     MDM Rules/Calculators/A&P                           Patient with abdominal pain, distnetion. Vitals are stable, no fever. Labs low potassium, otherwise reassuring, normal WBC. Imaging gaseous distention of the large intestine, stool in rectum. No signs of dehydration, patient is tolerating PO's. Lungs are clear and no signs suggestive of PNA. Low concern for anasarca, appendicitis, cholecystitis, pancreatitis, ruptured viscus, UTI, kidney stone, aortic dissection, aortic aneurysm or other emergent abdominal etiology. Supportive therapy indicated with return if symptoms worsen.   Final Clinical Impression(s) / ED Diagnoses Final diagnoses:  Abdominal distention  Generalized abdominal pain  Constipation, unspecified constipation type    Rx / DC Orders ED Discharge Orders     None        Suann Larry 11/17/20 2124    [name] Rasmussen, MD 11/18/20 1051

## 2020-11-17 NOTE — ED Triage Notes (Signed)
BIB [facility] from [facility]. Endorses abdominal pain and distension x1 week. Was seen 2 days age for fall out of bed. No abdominal complaints at this time. EMS notes generalized pain and abdominal rigidity. Pt. Reports not having a BM in a few days. BP 202/102  Pulse 90  RR 20 O2 100  Temp 101.5 CBG 178

## 2020-11-17 NOTE — ED Notes (Signed)
[facility] called for transport.  

## 2020-11-17 NOTE — Discharge Instructions (Addendum)
Please read and follow all provided instructions.  Your diagnoses today include:  1. Abdominal distention   2. Generalized abdominal pain     Tests performed today include: Blood cell counts and platelets  Kidney and liver function tests -low potassium, continue potassium supplements.  Have this rechecked by your doctor. Pancreas function test (called lipase) CT scan -does not show any signs of bowel blockage but does show a lot of gas in the large intestine Vital signs. See below for your results today.   Medications prescribed:  None  Take any prescribed medications only as directed.  Home care instructions:  Follow any educational materials contained in this packet.  To facility: Please continue giving senna and MiraLAX.  MiraLAX can be given up to twice a day.  [name] needs to continue good hydration.  The CT scan today showed a lot of bowel gas causing distention of the abdomen.  He also had a large amount of stool in the rectum.  He did have a bowel movement here.  Symptoms do not appear to be related to heart failure today.  Follow-up instructions: Please follow-up with your primary care provider in the next 3 days for further evaluation of your symptoms.    Return instructions:  SEEK IMMEDIATE MEDICAL ATTENTION IF: The pain does not go away or becomes severe  A temperature above 101F develops  Repeated vomiting occurs (multiple episodes)  The pain becomes localized to portions of the abdomen. The right side could possibly be appendicitis. In an adult, the left lower portion of the abdomen could be colitis or diverticulitis.  Blood is being passed in stools or vomit (bright red or black tarry stools)  You develop chest pain, difficulty breathing, dizziness or fainting, or become confused, poorly responsive, or inconsolable (young children) If you have any other emergent concerns regarding your health  Additional Information: Abdominal (belly) pain can be caused by  many things. Your caregiver performed an examination and possibly ordered blood/urine tests and imaging (CT scan, x-rays, ultrasound). Many cases can be observed and treated at home after initial evaluation in the emergency department. Even though you are being discharged home, abdominal pain can be unpredictable. Therefore, you need a repeated exam if your pain does not resolve, returns, or worsens. Most patients with abdominal pain don't have to be admitted to the hospital or have surgery, but serious problems like appendicitis and gallbladder attacks can start out as nonspecific pain. Many abdominal conditions cannot be diagnosed in one visit, so follow-up evaluations are very important.  Your vital signs today were: BP 139/88   Pulse 76   Temp 99.2 F (37.3 C) (Rectal)   Resp 11   SpO2 97%  If your blood pressure (bp) was elevated above 135/85 this visit, please have this repeated by your doctor within one month. --------------

## 2020-11-18 ENCOUNTER — Inpatient Hospital Stay (HOSPITAL_COMMUNITY)
Admission: EM | Admit: 2020-11-18 | Discharge: 2020-12-11 | DRG: 392 | Disposition: A | Discharge status: Skilled Nursing Facility | Payer: Medicare HMO | Source: Skilled Nursing Facility | Attending: Internal Medicine | Admitting: Internal Medicine

## 2020-11-18 ENCOUNTER — Emergency Department (HOSPITAL_COMMUNITY): Payer: Medicare HMO

## 2020-11-18 DIAGNOSIS — N1831 Acute kidney failure, unspecified: Secondary | ICD-10-CM

## 2020-11-18 DIAGNOSIS — I5189 Other ill-defined heart diseases: Secondary | ICD-10-CM | POA: Diagnosis not present

## 2020-11-18 DIAGNOSIS — I5032 Chronic diastolic (congestive) heart failure: Secondary | ICD-10-CM | POA: Insufficient documentation

## 2020-11-18 DIAGNOSIS — I517 Cardiomegaly: Secondary | ICD-10-CM | POA: Diagnosis not present

## 2020-11-18 DIAGNOSIS — G8929 Other chronic pain: Secondary | ICD-10-CM | POA: Diagnosis present

## 2020-11-18 DIAGNOSIS — K567 Ileus, unspecified: Secondary | ICD-10-CM | POA: Diagnosis not present

## 2020-11-18 DIAGNOSIS — I3139 Other pericardial effusion (noninflammatory): Secondary | ICD-10-CM | POA: Diagnosis present

## 2020-11-18 DIAGNOSIS — I2510 Atherosclerotic heart disease of native coronary artery without angina pectoris: Secondary | ICD-10-CM | POA: Diagnosis present

## 2020-11-18 DIAGNOSIS — E1165 Type 2 diabetes mellitus with hyperglycemia: Secondary | ICD-10-CM | POA: Diagnosis present

## 2020-11-18 DIAGNOSIS — IMO0002 Reserved for concepts with insufficient information to code with codable children: Secondary | ICD-10-CM | POA: Diagnosis present

## 2020-11-18 DIAGNOSIS — L89626 Pressure-induced deep tissue damage of left heel: Secondary | ICD-10-CM | POA: Diagnosis present

## 2020-11-18 DIAGNOSIS — I10 Essential (primary) hypertension: Secondary | ICD-10-CM | POA: Diagnosis not present

## 2020-11-18 DIAGNOSIS — F319 Bipolar disorder, unspecified: Secondary | ICD-10-CM | POA: Diagnosis present

## 2020-11-18 DIAGNOSIS — K3189 Other diseases of stomach and duodenum: Secondary | ICD-10-CM | POA: Diagnosis present

## 2020-11-18 DIAGNOSIS — K6389 Other specified diseases of intestine: Secondary | ICD-10-CM | POA: Diagnosis not present

## 2020-11-18 DIAGNOSIS — E1122 Type 2 diabetes mellitus with diabetic chronic kidney disease: Secondary | ICD-10-CM | POA: Diagnosis present

## 2020-11-18 DIAGNOSIS — I959 Hypotension, unspecified: Secondary | ICD-10-CM

## 2020-11-18 DIAGNOSIS — Z823 Family history of stroke: Secondary | ICD-10-CM

## 2020-11-18 DIAGNOSIS — Z993 Dependence on wheelchair: Secondary | ICD-10-CM

## 2020-11-18 DIAGNOSIS — X58XXXA Exposure to other specified factors, initial encounter: Secondary | ICD-10-CM | POA: Diagnosis present

## 2020-11-18 DIAGNOSIS — R140 Abdominal distension (gaseous): Secondary | ICD-10-CM | POA: Diagnosis not present

## 2020-11-18 DIAGNOSIS — E1140 Type 2 diabetes mellitus with diabetic neuropathy, unspecified: Secondary | ICD-10-CM | POA: Diagnosis present

## 2020-11-18 DIAGNOSIS — Z4659 Encounter for fitting and adjustment of other gastrointestinal appliance and device: Secondary | ICD-10-CM

## 2020-11-18 DIAGNOSIS — N179 Acute kidney failure, unspecified: Secondary | ICD-10-CM | POA: Insufficient documentation

## 2020-11-18 DIAGNOSIS — D696 Thrombocytopenia, unspecified: Secondary | ICD-10-CM | POA: Diagnosis present

## 2020-11-18 DIAGNOSIS — Z794 Long term (current) use of insulin: Secondary | ICD-10-CM

## 2020-11-18 DIAGNOSIS — R1084 Generalized abdominal pain: Secondary | ICD-10-CM | POA: Diagnosis present

## 2020-11-18 DIAGNOSIS — L8990 Pressure ulcer of unspecified site, unspecified stage: Secondary | ICD-10-CM | POA: Insufficient documentation

## 2020-11-18 DIAGNOSIS — D509 Iron deficiency anemia, unspecified: Secondary | ICD-10-CM | POA: Diagnosis present

## 2020-11-18 DIAGNOSIS — K560 Paralytic ileus: Secondary | ICD-10-CM | POA: Diagnosis present

## 2020-11-18 DIAGNOSIS — I69351 Hemiplegia and hemiparesis following cerebral infarction affecting right dominant side: Secondary | ICD-10-CM

## 2020-11-18 DIAGNOSIS — Z7982 Long term (current) use of aspirin: Secondary | ICD-10-CM

## 2020-11-18 DIAGNOSIS — E871 Hypo-osmolality and hyponatremia: Secondary | ICD-10-CM | POA: Diagnosis not present

## 2020-11-18 DIAGNOSIS — K5981 Ogilvie syndrome: Secondary | ICD-10-CM | POA: Diagnosis not present

## 2020-11-18 DIAGNOSIS — R509 Fever, unspecified: Secondary | ICD-10-CM | POA: Diagnosis not present

## 2020-11-18 DIAGNOSIS — F32A Depression, unspecified: Secondary | ICD-10-CM

## 2020-11-18 DIAGNOSIS — Z20822 Contact with and (suspected) exposure to covid-19: Secondary | ICD-10-CM | POA: Diagnosis present

## 2020-11-18 DIAGNOSIS — Z743 Need for continuous supervision: Secondary | ICD-10-CM | POA: Diagnosis not present

## 2020-11-18 DIAGNOSIS — E876 Hypokalemia: Secondary | ICD-10-CM | POA: Diagnosis not present

## 2020-11-18 DIAGNOSIS — Z91018 Allergy to other foods: Secondary | ICD-10-CM

## 2020-11-18 DIAGNOSIS — Z87891 Personal history of nicotine dependence: Secondary | ICD-10-CM

## 2020-11-18 DIAGNOSIS — E785 Hyperlipidemia, unspecified: Secondary | ICD-10-CM | POA: Diagnosis present

## 2020-11-18 DIAGNOSIS — I6930 Unspecified sequelae of cerebral infarction: Secondary | ICD-10-CM

## 2020-11-18 DIAGNOSIS — R112 Nausea with vomiting, unspecified: Secondary | ICD-10-CM | POA: Diagnosis not present

## 2020-11-18 DIAGNOSIS — S31829A Unspecified open wound of left buttock, initial encounter: Secondary | ICD-10-CM | POA: Diagnosis present

## 2020-11-18 DIAGNOSIS — F418 Other specified anxiety disorders: Secondary | ICD-10-CM

## 2020-11-18 DIAGNOSIS — R69 Illness, unspecified: Secondary | ICD-10-CM | POA: Diagnosis not present

## 2020-11-18 DIAGNOSIS — N400 Enlarged prostate without lower urinary tract symptoms: Secondary | ICD-10-CM | POA: Diagnosis not present

## 2020-11-18 DIAGNOSIS — Z09 Encounter for follow-up examination after completed treatment for conditions other than malignant neoplasm: Secondary | ICD-10-CM

## 2020-11-18 DIAGNOSIS — D631 Anemia in chronic kidney disease: Secondary | ICD-10-CM | POA: Diagnosis present

## 2020-11-18 DIAGNOSIS — K5939 Other megacolon: Secondary | ICD-10-CM | POA: Diagnosis present

## 2020-11-18 DIAGNOSIS — Z6841 Body Mass Index (BMI) 40.0 and over, adult: Secondary | ICD-10-CM

## 2020-11-18 DIAGNOSIS — Z7984 Long term (current) use of oral hypoglycemic drugs: Secondary | ICD-10-CM

## 2020-11-18 DIAGNOSIS — Z79899 Other long term (current) drug therapy: Secondary | ICD-10-CM

## 2020-11-18 DIAGNOSIS — R109 Unspecified abdominal pain: Secondary | ICD-10-CM | POA: Diagnosis not present

## 2020-11-18 DIAGNOSIS — D649 Anemia, unspecified: Secondary | ICD-10-CM | POA: Diagnosis present

## 2020-11-18 DIAGNOSIS — Z833 Family history of diabetes mellitus: Secondary | ICD-10-CM

## 2020-11-18 DIAGNOSIS — Q438 Other specified congenital malformations of intestine: Secondary | ICD-10-CM

## 2020-11-18 DIAGNOSIS — L89616 Pressure-induced deep tissue damage of right heel: Secondary | ICD-10-CM | POA: Diagnosis present

## 2020-11-18 DIAGNOSIS — I130 Hypertensive heart and chronic kidney disease with heart failure and stage 1 through stage 4 chronic kidney disease, or unspecified chronic kidney disease: Secondary | ICD-10-CM | POA: Diagnosis present

## 2020-11-18 DIAGNOSIS — E870 Hyperosmolality and hypernatremia: Secondary | ICD-10-CM | POA: Diagnosis not present

## 2020-11-18 DIAGNOSIS — Z7401 Bed confinement status: Secondary | ICD-10-CM

## 2020-11-18 DIAGNOSIS — Z8249 Family history of ischemic heart disease and other diseases of the circulatory system: Secondary | ICD-10-CM

## 2020-11-18 HISTORY — DX: Chronic kidney disease, stage 3a: N17.9

## 2020-11-18 HISTORY — DX: Ileus, unspecified: K56.7

## 2020-11-18 HISTORY — DX: Acute kidney failure, unspecified: N18.31

## 2020-11-18 LAB — COMPREHENSIVE METABOLIC PANEL
ALT: 14 U/L (ref 0–44)
AST: 15 U/L (ref 15–41)
Albumin: 3.7 g/dL (ref 3.5–5.0)
Alkaline Phosphatase: 77 U/L (ref 38–126)
Anion gap: 11 (ref 5–15)
BUN: 42 mg/dL — ABNORMAL HIGH (ref 6–20)
CO2: 24 mmol/L (ref 22–32)
Calcium: 9.3 mg/dL (ref 8.9–10.3)
Chloride: 106 mmol/L (ref 98–111)
Creatinine, Ser: 1.48 mg/dL — ABNORMAL HIGH (ref 0.61–1.24)
GFR, Estimated: 54 mL/min — ABNORMAL LOW (ref >60)
Glucose, Bld: 211 mg/dL — ABNORMAL HIGH (ref 70–99)
Potassium: 3.1 mmol/L — ABNORMAL LOW (ref 3.5–5.1)
Sodium: 141 mmol/L (ref 135–145)
Total Bilirubin: 1 mg/dL (ref 0.3–1.2)
Total Protein: 8.1 g/dL (ref 6.5–8.1)

## 2020-11-18 LAB — URINALYSIS, ROUTINE W REFLEX MICROSCOPIC
Bacteria, UA: NONE SEEN
Bilirubin Urine: NEGATIVE
Glucose, UA: >=500 mg/dL — AB
Ketones, ur: NEGATIVE mg/dL
Leukocytes,Ua: NEGATIVE
Nitrite: NEGATIVE
Protein, ur: 100 mg/dL — AB
Specific Gravity, Urine: 1.015 (ref 1.005–1.030)
pH: 6 (ref 5.0–8.0)

## 2020-11-18 LAB — CBC WITH DIFFERENTIAL/PLATELET
Abs Immature Granulocytes: 0.01 10*3/uL (ref 0.00–0.07)
Basophils Absolute: 0 10*3/uL (ref 0.0–0.1)
Basophils Relative: 1 %
Eosinophils Absolute: 0 10*3/uL (ref 0.0–0.5)
Eosinophils Relative: 1 %
HCT: 35.8 % — ABNORMAL LOW (ref 39.0–52.0)
Hemoglobin: 11 g/dL — ABNORMAL LOW (ref 13.0–17.0)
Immature Granulocytes: 0 %
Lymphocytes Relative: 24 %
Lymphs Abs: 1 10*3/uL (ref 0.7–4.0)
MCH: 24.6 pg — ABNORMAL LOW (ref 26.0–34.0)
MCHC: 30.7 g/dL (ref 30.0–36.0)
MCV: 80.1 fL (ref 80.0–100.0)
Monocytes Absolute: 0.4 10*3/uL (ref 0.1–1.0)
Monocytes Relative: 9 %
Neutro Abs: 2.7 10*3/uL (ref 1.7–7.7)
Neutrophils Relative %: 65 %
Platelets: 208 10*3/uL (ref 150–400)
RBC: 4.47 MIL/uL (ref 4.22–5.81)
RDW: 17 % — ABNORMAL HIGH (ref 11.5–15.5)
WBC: 4.1 10*3/uL (ref 4.0–10.5)
nRBC: 0 % (ref 0.0–0.2)

## 2020-11-18 LAB — RESP PANEL BY RT-PCR (FLU A&B, COVID) ARPGX2
Influenza A by PCR: NEGATIVE
Influenza B by PCR: NEGATIVE
SARS Coronavirus 2 by RT PCR: NEGATIVE

## 2020-11-18 LAB — I-STAT CHEM 8, ED
BUN: 38 mg/dL — ABNORMAL HIGH (ref 6–20)
Calcium, Ion: 1.21 mmol/L (ref 1.15–1.40)
Chloride: 107 mmol/L (ref 98–111)
Creatinine, Ser: 1.6 mg/dL — ABNORMAL HIGH (ref 0.61–1.24)
Glucose, Bld: 214 mg/dL — ABNORMAL HIGH (ref 70–99)
HCT: 35 % — ABNORMAL LOW (ref 39.0–52.0)
Hemoglobin: 11.9 g/dL — ABNORMAL LOW (ref 13.0–17.0)
Potassium: 3.2 mmol/L — ABNORMAL LOW (ref 3.5–5.1)
Sodium: 145 mmol/L (ref 135–145)
TCO2: 26 mmol/L (ref 22–32)

## 2020-11-18 LAB — POC OCCULT BLOOD, ED: Fecal Occult Bld: NEGATIVE

## 2020-11-18 LAB — LIPASE, BLOOD: Lipase: 21 U/L (ref 11–51)

## 2020-11-18 LAB — MAGNESIUM: Magnesium: 2.3 mg/dL (ref 1.7–2.4)

## 2020-11-18 LAB — TROPONIN I (HIGH SENSITIVITY)
Troponin I (High Sensitivity): 37 ng/L — ABNORMAL HIGH (ref <18)
Troponin I (High Sensitivity): 45 ng/L — ABNORMAL HIGH (ref <18)

## 2020-11-18 LAB — LACTIC ACID, PLASMA: Lactic Acid, Venous: 1 mmol/L (ref 0.5–1.9)

## 2020-11-18 MED ORDER — POTASSIUM CHLORIDE 10 MEQ/100ML IV SOLN
10.0000 meq | INTRAVENOUS | Status: AC
Start: 2020-11-18 — End: 2020-11-19
  Administered 2020-11-18 (×4): 10 meq via INTRAVENOUS
  Filled 2020-11-18 (×4): qty 100

## 2020-11-18 MED ORDER — SORBITOL 70 % SOLN
960.0000 mL | TOPICAL_OIL | Freq: Once | ORAL | Status: DC
  Filled 2020-11-18: qty 473

## 2020-11-18 MED ORDER — GLYCERIN (LAXATIVE) 2 G RE SUPP
1.0000 | Freq: Once | RECTAL | Status: AC
  Administered 2020-11-18: 1 via RECTAL
  Filled 2020-11-18: qty 1

## 2020-11-18 MED ORDER — LACTATED RINGERS IV BOLUS
250.0000 mL | Freq: Once | INTRAVENOUS | Status: AC
  Administered 2020-11-18: 250 mL via INTRAVENOUS

## 2020-11-18 MED ORDER — LACTATED RINGERS IV SOLN: INTRAVENOUS | Status: AC

## 2020-11-18 MED ORDER — LABETALOL HCL 5 MG/ML IV SOLN
10.0000 mg | Freq: Once | INTRAVENOUS | Status: AC
  Administered 2020-11-18: 10 mg via INTRAVENOUS
  Filled 2020-11-18: qty 4

## 2020-11-18 MED ORDER — LABETALOL HCL 5 MG/ML IV SOLN
5.0000 mg | INTRAVENOUS | Status: DC | PRN
  Administered 2020-11-18 – 2020-11-27 (×20): 5 mg via INTRAVENOUS
  Filled 2020-11-18 (×18): qty 4

## 2020-11-18 MED ORDER — NITROGLYCERIN 2 % TD OINT
1.0000 [in_us] | TOPICAL_OINTMENT | Freq: Once | TRANSDERMAL | Status: AC
  Administered 2020-11-18: 1 [in_us] via TOPICAL
  Filled 2020-11-18: qty 1

## 2020-11-18 NOTE — ED Notes (Signed)
Pt able to urinate on his own into urinal with this nurse witnessing. 500cc urine output at this time. Urine collected and sent to lab.

## 2020-11-18 NOTE — ED Notes (Signed)
Provider back at the bedside to evaluate. Nitro patch removed from pt's right chest per provider's verbal order. BP 126/75.

## 2020-11-18 NOTE — ED Notes (Signed)
This nurse called the lab inquiring about the pt's lab work that was collected but not run at 1500 today upon pt arrival. Lab running blood work now. EDP notified and aware.

## 2020-11-18 NOTE — ED Provider Notes (Signed)
[facility] DEPT Provider Note   CSN: [id number] Arrival date & time: 11/18/20  1427     History No chief complaint on file.   [name] is a 60 y.o. male.  HPI Patient presents for abdominal pain.  History of poorly controlled diabetes, in addition to hypertension and CHF.  He was seen in the ED yesterday for similar symptoms.  At that time, he endorsed abdominal distention, pain, nausea, vomiting with worsening symptoms over the past week.  Labs from yesterday showed a potassium of 3.0, no leukocytosis, creatinine 1.25, and hemoglobin 10.6.  CT scan yesterday showed a large amount of gaseous distention of large bowel without evidence of obstruction.  He did have a bowel movement while in the ED.  He was advised to continue stool softener therapy and was discharged.  He reports that he had a bowel movement this morning.  He was told by staff at his facility that it was a "good one".  At baseline, patient has bowel and urinary incontinence.  He does wear a diaper.  Bowel movement this morning was into his diaper.  He is in his current chronic condition due to a stroke 1 year ago.  He has right-sided hemibody weakness from his prior stroke.  He has been working with physical therapy.  He has gotten to the edge of his bed thus far.  He denies any similar GI symptoms in the past.  Per chart review, patient was hospitalized in July.  He was discharged with Imodium.  He reports that, since he left the ED earlier this morning, he has had continued abdominal distention and pain.  Pain is generalized.  It is worse with palpation.  He also experiences nausea and did have an episode of vomiting today.  He has not been able to tolerate p.o. intake today.  He did not take his morning medications.  He also missed last night's medications because he was here in the ED.  He believes that he has had a fever.  He does endorse current chills.    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   Depression 11/18/2020   Acute-on-chronic kidney injury ([place]) 11/18/2020   BPH (benign prostatic hyperplasia) 11/18/2020   AKI (acute kidney injury) ([place]) 08/22/2020   CVA ([place]) with residual right-sided weakness 08/22/2020   Pericardial effusion 08/22/2020   Acute on chronic diastolic CHF (congestive heart failure) ([place]) 08/22/2020   TIA (transient ischemic attack) 07/03/2020   Acute on chronic heart failure with preserved ejection fraction ([place])    Pulmonary artery hypertension ([place]) 06/25/2020   CKD (chronic kidney disease) 06/25/2020   History of cerebrovascular accident (CVA) with residual deficit 06/25/2020   CKD stage IIIa ([place]) 03/30/2020   Morbid (severe) obesity due to excess calories ([place]) 03/30/2020   Malnutrition of moderate degree 01/25/2020   Palliative care by specialist    Goals of care, counseling/discussion    Nephrotic syndrome 01/06/2020   Anasarca 01/06/2020   Hypokalemia due to loss of potassium 01/04/2020   Generalized body aches 01/01/2020  Non-traumatic rhabdomyolysis 01/01/2020   Grade I diastolic dysfunction 60/11/9321   Microcytic anemia 01/01/2020   Hypokalemia 12/31/2019   Elevated CK    Chronic diastolic CHF (congestive heart failure) ([place]) 12/31/2017   Diabetes mellitus type 2, uncontrolled ([place])    Pure hypercholesterolemia    Hyperglycemia 09/28/2017   Nonobstructive atherosclerosis of coronary artery 09/28/2017   MDD (major depressive disorder), recurrent episode, severe ([place]) 07/30/2017   Osteoarthritis 04/12/2015   Severe recurrent major depression with psychotic features ([place]) 01/04/2015    Substance-related disorder ([place]) 10/30/2014   Patient's noncompliance with other medical treatment and regimen 06/07/2014   Cocaine dependence, uncomplicated ([place]) 55/73/2202   Chronic pain 05/31/2014   Cocaine abuse () 05/31/2014   History of noncompliance with medical treatment 05/31/2014   Malingering 05/31/2014   Leukopenia 05/31/2014   Diabetic neuropathy associated with diabetes mellitus due to underlying condition ([place]) 05/31/2014   Diabetic neuropathy ([place]) 05/31/2014   Congestive heart failure with left ventricular systolic dysfunction ([place]) 02/22/2014   Depression, major, recurrent, severe with psychosis ([place]) 08/29/2013   Suicidal ideations 08/28/2013   Uncontrolled diabetes mellitus ([place]) 06/30/2013   Atypical chest pain 12/09/2011   Essential hypertension 12/09/2011   Episodic mood disorder ([place]) 05/13/2011    Past Surgical History:  Procedure Laterality Date   BOWEL DECOMPRESSION N/A 01/19/2020   Procedure: BOWEL DECOMPRESSION;  Surgeon: Otis Brace, MD;  Location: [facility];  Service: Gastroenterology;  Laterality: N/A;   COLONOSCOPY WITH PROPOFOL N/A 01/19/2020   Procedure: COLONOSCOPY WITH PROPOFOL;  Surgeon: Otis Brace, MD;  Location: [facility];  Service: Gastroenterology;  Laterality: N/A;   KNEE SURGERY     b/l knees; pending total knee    RADIOLOGY WITH ANESTHESIA N/A 07/04/2020   Procedure: MRI WITH ANESTHESIA BRAIN WITHOUT CONTRAST AND HEAD WITHOUT CONTRAST;  Surgeon: Radiologist, Medication, MD;  Location: [facility];  Service: Radiology;  Laterality: N/A;       Family History  Problem Relation Age of Onset   Diabetes Other    Cancer Mother        uterus; deceased    Heart attack Mother 64   Hypertension Other    Heart attack Father 37   Stroke Sister        12/2012 age 56 y.o    Social History   Tobacco Use   Smoking status: Former   Smokeless tobacco: Never  Scientific laboratory technician Use: Never used  Substance Use Topics    Alcohol use: No   Drug use: Not Currently    Types: Cocaine    Comment: Last use 2019    Home Medications Prior to Admission medications   Medication Sig Start Date End Date Taking? Authorizing Provider  amLODipine (NORVASC) 10 MG tablet Take 1 tablet (10 mg total) by mouth daily. 07/05/20 10/03/20  [place] Donnamarie Poag, DO  amLODipine (NORVASC) 10 MG tablet Take 10 mg by mouth daily.    [provider]  aspirin 81 MG chewable tablet Chew by mouth daily.    [provider]  atorvastatin (LIPITOR) 80 MG tablet Take 1 tablet (80 mg total) by mouth at bedtime. 07/05/20 10/03/20  [place] Donnamarie Poag, DO  atorvastatin (LIPITOR) 80 MG tablet Take 80 mg by mouth at bedtime.    [provider]  carvedilol (COREG) 12.5 MG tablet Take 1 tablet (12.5 mg total) by mouth 2 (two) times daily with a meal. 09/07/20 09/02/21  Antonieta Pert, MD  empagliflozin (JARDIANCE) 10 MG TABS tablet Take 1 tablet (  10 mg total) by mouth daily. 09/23/20   [name], [name], DO  Ensure (ENSURE) Take 237 mLs by mouth 5 (five) times daily.    [provider]  ferrous sulfate 325 (65 FE) MG tablet Take 1 tablet (325 mg total) by mouth daily. 09/23/20   [name], Georgina Quint Latif, DO  insulin aspart (NOVOLOG) 100 UNIT/ML injection Inject 2-12 Units into the skin See admin instructions. Check and record blood sugar before meals and inject per ssi. 151-200 = 2 units  201-250 = 4 units  251-300 = 6 units  301-350 = 8 units  351-400 = 10 units  greater than 400 = 12 units and call MD. 09/07/20   Antonieta Pert, MD  insulin glargine (LANTUS) 100 UNIT/ML injection Inject 0.15 mLs (15 Units total) into the skin at bedtime. 09/22/20   [name], [name], DO  isosorbide-hydrALAZINE (BIDIL) 20-37.5 MG tablet Take 1 tablet by mouth 3 (three) times daily. 09/07/20   Antonieta Pert, MD  loperamide (IMODIUM) 2 MG capsule Take 1 capsule (2 mg total) by mouth as needed for diarrhea or loose stools. 09/22/20   [name], [name], DO  NON  FORMULARY Take 237 mLs by mouth in the morning and at bedtime. ensure drink    [provider]  ondansetron (ZOFRAN ODT) 4 MG disintegrating tablet 4mg  ODT q4 hours prn nausea/vomit Patient taking differently: Take 4 mg by mouth every 4 (four) hours as needed for nausea or vomiting. 07/11/20   Drenda Freeze, MD  pantoprazole (PROTONIX) 40 MG tablet Take 40 mg by mouth daily.    [provider]  polyethylene glycol (MIRALAX / GLYCOLAX) 17 g packet Take 17 g by mouth daily as needed. Patient taking differently: Take 17 g by mouth daily as needed for mild constipation. 03/30/20   [name], NP  potassium chloride (KLOR-CON) 10 MEQ tablet Take 20 mEq by mouth 2 (two) times daily.    [provider]  potassium chloride SA (KLOR-CON) 20 MEQ tablet Take 1 tablet (20 mEq total) by mouth 2 (two) times daily. Patient not taking: Reported on 10/24/2020 09/07/20   Antonieta Pert, MD  pregabalin (LYRICA) 75 MG capsule Take 1 capsule (75 mg total) by mouth 2 (two) times daily. 07/05/20 10/03/20  [place] Donnamarie Poag, DO  pregabalin (LYRICA) 75 MG capsule Take 75 mg by mouth 2 (two) times daily.    [provider]  saccharomyces boulardii (FLORASTOR) 250 MG capsule Take 1 capsule (250 mg total) by mouth 2 (two) times daily. 09/07/20   Antonieta Pert, MD  sacubitril-valsartan (ENTRESTO) 49-51 MG Take 1 tablet by mouth 2 (two) times daily.    [provider]  senna-docusate (SENOKOT-S) 8.6-50 MG tablet Take 1 tablet by mouth daily.    [provider]  sertraline (ZOLOFT) 50 MG tablet Take 50 mg by mouth daily.    [provider]  tamsulosin (FLOMAX) 0.4 MG CAPS capsule Take 0.8 mg by mouth daily.    [provider]  torsemide (DEMADEX) 20 MG tablet Take 40 mg by mouth 2 (two) times daily.    [provider]  torsemide 40 MG TABS Take 40 mg by mouth 2 (two) times daily. Adjust Lasix dose based upon his weight and renal function in a week at [facility]  09/14/20 10/14/20  Antonieta Pert, MD  Vitamin D, Ergocalciferol, (DRISDOL) 1.25 MG (50000 UNIT) CAPS capsule Take 50,000 Units by mouth every 7 (seven) days.    [provider]  Allergies    Other  Review of Systems   Review of Systems  Constitutional:  Positive for appetite change, chills and fever.  HENT:  Negative for ear pain and sore throat.   Eyes:  Negative for pain and visual disturbance.  Respiratory:  Negative for cough and shortness of breath.   Cardiovascular:  Negative for chest pain and palpitations.  Gastrointestinal:  Positive for abdominal distention, abdominal pain, nausea and vomiting.  Genitourinary:  Negative for dysuria and hematuria.  Musculoskeletal:  Positive for back pain (Chronic, lower). Negative for arthralgias, myalgias and neck pain.  Skin:  Negative for color change and rash.  Neurological:  Positive for weakness (Right hemibody, baseline). Negative for dizziness, seizures, syncope, numbness and headaches.  All other systems reviewed and are negative.  Physical Exam Updated Vital Signs BP (!) 171/94 (BP Location: Left Arm)   Pulse 98   Temp 98 F (36.7 C) (Oral)   Resp 16   SpO2 95%   Physical Exam Vitals and nursing note reviewed.  Constitutional:      Appearance: He is well-developed. He is not toxic-appearing or diaphoretic.  HENT:     Head: Normocephalic and atraumatic.     Right Ear: External ear normal.     Left Ear: External ear normal.     Nose: Nose normal.     Mouth/Throat:     Mouth: Mucous membranes are moist.     Pharynx: Oropharynx is clear.     Comments: Missing teeth Eyes:     Extraocular Movements: Extraocular movements intact.     Conjunctiva/sclera: Conjunctivae normal.  Cardiovascular:     Rate and Rhythm: Normal rate and regular rhythm.     Heart sounds: No murmur heard. Pulmonary:     Effort: Pulmonary effort is normal. No respiratory distress.     Breath sounds: Normal breath sounds. No wheezing or rales.   Chest:     Chest wall: No tenderness.  Abdominal:     General: There is distension.     Palpations: Abdomen is soft.     Tenderness: There is abdominal tenderness.  Musculoskeletal:        General: Tenderness (Lower back) present. Normal range of motion.     Cervical back: Neck supple.     Right lower leg: Edema present.     Left lower leg: Edema present.  Skin:    General: Skin is warm and dry.     Comments: Chronic skin desquamation in lower extremities  Neurological:     General: No focal deficit present.     Mental Status: He is alert and oriented to person, place, and time.     Cranial Nerves: No cranial nerve deficit.     Sensory: No sensory deficit.     Motor: Weakness (4/5 strength on right hemibody) present.  Psychiatric:        Mood and Affect: Mood normal.        Behavior: Behavior normal.    ED Results / Procedures / Treatments   Labs (all labs ordered are listed, but only abnormal results are displayed) Labs Reviewed  COMPREHENSIVE METABOLIC PANEL - Abnormal; Notable for the following components:      Result Value   Potassium 3.1 (*)    Glucose, Bld 211 (*)    BUN 42 (*)    Creatinine, Ser 1.48 (*)    GFR, Estimated 54 (*)    All other components within normal limits  CBC WITH DIFFERENTIAL/PLATELET - Abnormal; Notable for  the following components:   Hemoglobin 11.0 (*)    HCT 35.8 (*)    MCH 24.6 (*)    RDW 17.0 (*)    All other components within normal limits  URINALYSIS, ROUTINE W REFLEX MICROSCOPIC - Abnormal; Notable for the following components:   Glucose, UA >=500 (*)    Hgb urine dipstick SMALL (*)    Protein, ur 100 (*)    All other components within normal limits  GLUCOSE, CAPILLARY - Abnormal; Notable for the following components:   Glucose-Capillary 194 (*)    All other components within normal limits  GLUCOSE, CAPILLARY - Abnormal; Notable for the following components:   Glucose-Capillary 167 (*)    All other components within normal  limits  I-STAT CHEM 8, ED - Abnormal; Notable for the following components:   Potassium 3.2 (*)    BUN 38 (*)    Creatinine, Ser 1.60 (*)    Glucose, Bld 214 (*)    Hemoglobin 11.9 (*)    HCT 35.0 (*)    All other components within normal limits  TROPONIN I (HIGH SENSITIVITY) - Abnormal; Notable for the following components:   Troponin I (High Sensitivity) 37 (*)    All other components within normal limits  TROPONIN I (HIGH SENSITIVITY) - Abnormal; Notable for the following components:   Troponin I (High Sensitivity) 45 (*)    All other components within normal limits  RESP PANEL BY RT-PCR (FLU A&B, COVID) ARPGX2  C DIFFICILE QUICK SCREEN W PCR REFLEX    LIPASE, BLOOD  MAGNESIUM  LACTIC ACID, PLASMA  TSH  POC OCCULT BLOOD, ED    EKG EKG Interpretation  Date/Time:  Saturday November 18 2020 16:15:11 EDT Ventricular Rate:  83 PR Interval:  164 QRS Duration: 96 QT Interval:  348 QTC Calculation: 409 R Axis:   18 Text Interpretation: Sinus rhythm Abnormal R-wave progression, early transition Probable LVH with secondary repol abnrm Confirmed by Godfrey Pick [phone]) on 11/18/2020 5:33:14 PM  Radiology DG Chest 1 View  Result Date: 11/17/2020 CLINICAL DATA:  Shortness of breath.  Abdominal pain and distension. EXAM: CHEST  1 VIEW COMPARISON:  Chest radiograph head CT 10/24/2020. FINDINGS: Patient is rotated. Stable cardiomegaly. Stable aortic tortuosity and atherosclerosis. No acute airspace disease, pleural effusion, or pneumothorax. Gaseous distention of stomach in the left upper quadrant is similar to prior exam. No acute osseous abnormalities are seen. IMPRESSION: No acute findings. Stable low lung volumes with cardiomegaly and aortic tortuosity. Electronically Signed   By: Keith Rake M.D.   On: 11/17/2020 19:20   CT ABDOMEN PELVIS W CONTRAST  Result Date: 11/17/2020 CLINICAL DATA:  Distension pain EXAM: CT ABDOMEN AND PELVIS WITH CONTRAST TECHNIQUE: Multidetector CT  imaging of the abdomen and pelvis was performed using the standard protocol following bolus administration of intravenous contrast. CONTRAST:  11mL OMNIPAQUE IOHEXOL 350 MG/ML SOLN COMPARISON:  CT 08/30/2020, radiograph 08/30/2020, CT 08/18/2020, CT 01/05/2020 FINDINGS: Lower chest: Lung bases demonstrate no acute consolidation or effusion. Small pericardial effusion measuring up to 18 mm in thickness. Borderline to mild cardiomegaly. Gynecomastia. Hepatobiliary: Gallstone. No biliary dilatation or focal hepatic abnormality. Pancreas: Unremarkable. No pancreatic ductal dilatation or surrounding inflammatory changes. Spleen: Normal in size without focal abnormality. Adrenals/Urinary Tract: Adrenal glands are unremarkable. Kidneys are normal, without renal calculi, focal lesion, or hydronephrosis. Bladder is unremarkable. Stomach/Bowel: The stomach is nonenlarged. No dilated small bowel. Considerable diffuse air distension of the colon without acute wall thickening. No definite mesenteric twisting to suggest volvulus.  Moderate stool in the rectum Vascular/Lymphatic: Mild aortic atherosclerosis. No aneurysm. No suspicious nodes Reproductive: Prostate is unremarkable. Other: Negative for free air or effusion Musculoskeletal: No acute or suspicious osseous abnormality. IMPRESSION: 1. Considerable diffuse air distension of the colon but without evidence for volvulus, bowel wall thickening, or obstruction. 2. Gallstones Electronically Signed   By: Donavan Foil M.D.   On: 11/17/2020 20:28   DG Abd 2 Views  Result Date: 11/19/2020 CLINICAL DATA:  Follow-up rectosigmoid distention. EXAM: ABDOMEN - 2 VIEW COMPARISON:  11/18/2020 FINDINGS: Persistent gaseous distension of the colon. This appears unchanged from previous exam. No significant small bowel dilatation identified. IMPRESSION: No change in gaseous distension of the colon Electronically Signed   By: Kerby Moors M.D.   On: 11/19/2020 06:21   DG Abd Acute  W/Chest  Result Date: 11/18/2020 CLINICAL DATA:  Ileus EXAM: DG ABDOMEN ACUTE WITH 1 VIEW CHEST COMPARISON:  CT 11/17/2020, 08/30/2020 FINDINGS: Single-view chest demonstrates low lung volumes. No focal opacity or pleural effusion. Stable cardiomegaly. No pneumothorax. Supine and upright views of the abdomen demonstrate no free air beneath the diaphragm. No significant change in moderate to marked diffuse air distension of the colon. Rectosigmoid colon distension up to 12.4 cm. Excreted contrast in the urinary bladder. IMPRESSION: 1. Low lung volumes.  Cardiomegaly. 2. No significant change in moderate to marked diffuse air distension of the colon with colon distension up to 12.4 cm. Electronically Signed   By: Donavan Foil M.D.   On: 11/18/2020 16:27    Procedures Procedures   Medications Ordered in ED Medications  labetalol (NORMODYNE) injection 5 mg ( Intravenous MAR Hold 11/19/20 1226)  lactated ringers infusion ( Intravenous Handoff 11/19/20 0804)  ondansetron (ZOFRAN) injection 4 mg ( Intravenous MAR Hold 11/19/20 1226)  insulin aspart (novoLOG) injection 0-6 Units ( Subcutaneous Automatically Held 11/27/20 1700)  nitroGLYCERIN (NITROGLYN) 2 % ointment 1 inch (1 inch Topical Given 11/18/20 1709)  labetalol (NORMODYNE) injection 10 mg (10 mg Intravenous Given 11/18/20 1618)  lactated ringers bolus 250 mL (0 mLs Intravenous Stopped 11/18/20 2022)  potassium chloride 10 mEq in 100 mL IVPB (0 mEq Intravenous Stopped 11/19/20 0600)  Glycerin (Adult) 2 g suppository 1 suppository (1 suppository Rectal Given 11/18/20 2255)  traMADol (ULTRAM) tablet 50 mg (50 mg Oral Given 11/19/20 0645)    ED Course  I have reviewed the triage vital signs and the nursing notes.  Pertinent labs & imaging results that were available during my care of the patient were reviewed by me and considered in my medical decision making (see chart for details).    MDM Rules/Calculators/A&P                         CRITICAL  CARE Performed by: Godfrey Pick   Total critical care time: 40 minutes  Critical care time was exclusive of separately billable procedures and treating other patients.  Critical care was necessary to treat or prevent imminent or life-threatening deterioration.  Critical care was time spent personally by me on the following activities: development of treatment plan with patient and/or surrogate as well as nursing, discussions with consultants, evaluation of patient's response to treatment, examination of patient, obtaining history from patient or surrogate, ordering and performing treatments and interventions, ordering and review of laboratory studies, ordering and review of radiographic studies, pulse oximetry and re-evaluation of patient's condition.   Patient presents for persistent abdominal distention and pain.  He was seen in the ED  yesterday.  Work-up in the ED was notable for hypokalemia (3.0).  He reports that he has had persistent symptoms since he left the ED over this morning.  He did have a bowel movement this morning at his facility.  On arrival, vital signs are notable for normothermia and hypertension.  I suspect the hypertension is secondary to not taking his medications for the past 24 hours.  On exam, he does have a firm distended abdomen.  Per review of imaging from yesterday, this appears to be large bowel distention.  Etiology of this is unclear at this time.  He was prescribed Imodium upon discharge from his last hospitalization at the end of July.  He did have hypokalemia yesterday on lab work.  I suspect he may have hypomagnesemia as well.  Will initiate laboratory work-up to identify etiologies of this large bowel ileus.  Patient was given Nitropaste and IV labetalol for management of blood pressure.  IV hydration done gently given his history of CHF.  On reassessment, patient had resolution of his hypertension.  Nitropaste was removed.  He reported only minimal relief of his  abdominal discomfort.  Patient's magnesium was found to be normal.  His potassium remained low at 3.1.  IV replacement was ordered.  KUB showed markedly distended colon with maximal distention up to 12.4 cm.  Patient was hesitant but did consent for digital rectal exam.  No external masses were appreciated.  I was not able to pass my finger due to patient discomfort and increased rectal tone.  Given his continued discomfort and p.o. intolerance, patient will require admission.  Given the severe colonic distention, I spoke with surgery, who did come and see the patient in the ED.  They stated that, at current level of distention, patient is at risk for colonic perforation.  They recommended serial KUBs with the next 1 being tomorrow, GI consult for possible endoscopic decompression, and to keep NPO.  They did state that they will continue to follow while admitted.  I spoke with GI about possible endoscopic decompression.  Per chart review, it does appear that he had this procedure done last November.  They stated that they would see him as consult in the morning.  In the interim, they recommended trial of enemas.  This was ordered.  Patient was admitted to hospitalist for further management.  Final Clinical Impression(s) / ED Diagnoses Final diagnoses:  Ogilvie syndrome  Ileus [facility])    Rx / DC Orders ED Discharge Orders     None        Godfrey Pick, MD 11/19/20 1247

## 2020-11-18 NOTE — ED Notes (Signed)
Attempted to call [facility], no answer. Report given to [facility].

## 2020-11-18 NOTE — ED Notes (Signed)
EDP notified pt's troponin is 37.

## 2020-11-18 NOTE — ED Notes (Signed)
Pt to xray at this time.

## 2020-11-18 NOTE — H&P (Signed)
History and Physical    COLBURN ASPER BZJ:[id number] DOB: [birth year] DOA: 11/18/2020  PCP: [name], NP  Patient coming from: [place] have personally briefly reviewed patient's old medical records in [place]  Chief Complaint:   HPI: [name] is a 60 y.o. male with medical history significant for  HFpEF, hypertension, CVA, type 2 diabetes with neuropathy, CKD stage IIIa, polysubstance abuse, chronic pain, depression and hyperlipidemia who presents concerns of worsening abdominal pain.  Patient does not know how long he has been having abdominal pain.  States it hurts all over and he has not been able to eat for least 3 days.  Had increasing abdominal distention for least a week and a half.  On and off nausea and vomiting.  Reports last bowel movement this morning but does not know whether it was solid or diarrhea since he did not look.  States staff at facility told him that he was a "good one."  Denies chronic constipation.  He presented to the ED yesterday for the symptoms and was found on CT abdomen to have significant gastric distention of the colon but no obstruction or volvulus.  He also had a bowel movement in the ED that morning so was felt safe to be discharged with supportive care.  He has also required bowel decompression in 2021 with Dr. Alessandra Bevels.  ED Course: He was afebrile and hypertensive up to systolic of 017.  No leukocytosis or anemia.  Sodium of 145, potassium of 3.2, elevated creatinine of 1.6 with a prior baseline of around 1.2.  BG of 214.  Troponin of 37 and then 45.  KUB shows no significant change from prior imaging with colonic distention up to 12.4 cm.  Hospitalist was then called for admission and requested that general surgery be consulted. General surgery was then consulted and will evaluate patient at bedside and also recommended GI consultation.  GI recommends enemas overnight.  Glycerin suppository has been  ordered.  Review of Systems: Constitutional: No Weight Change, No Fever ENT/Mouth: No sore throat, No Rhinorrhea Eyes: No Eye Pain, No Vision Changes Cardiovascular: No Chest Pain, no SOB Respiratory: No Cough, No Sputum Gastrointestinal: + Nausea, + Vomiting, No Diarrhea, No Constipation, + Pain Genitourinary: no Urinary Incontinence, No Urgency, No Flank Pain Musculoskeletal: No Arthralgias, No Myalgias Skin: No Skin Lesions, No Pruritus, Neuro: no Weakness, No Numbness Psych: No Anxiety/Panic, No Depression, + decrease appetite Heme/Lymph: No Bruising, No Bleeding  Past Medical History:  Diagnosis Date   Arthritis    Bipolar affective (HCC)    CHF (congestive heart failure) (HCC)    Claustrophobia    Severe   Depression    Diabetes mellitus    uncontrolled    Heart murmur    Hypertension     Past Surgical History:  Procedure Laterality Date   BOWEL DECOMPRESSION N/A 01/19/2020   Procedure: BOWEL DECOMPRESSION;  Surgeon: Otis Brace, MD;  Location: [facility];  Service: Gastroenterology;  Laterality: N/A;   COLONOSCOPY WITH PROPOFOL N/A 01/19/2020   Procedure: COLONOSCOPY WITH PROPOFOL;  Surgeon: Otis Brace, MD;  Location: [facility];  Service: Gastroenterology;  Laterality: N/A;   KNEE SURGERY     b/l knees; pending total knee    RADIOLOGY WITH ANESTHESIA N/A 07/04/2020   Procedure: MRI WITH ANESTHESIA BRAIN WITHOUT CONTRAST AND HEAD WITHOUT CONTRAST;  Surgeon: Radiologist, Medication, MD;  Location: [facility];  Service: Radiology;  Laterality: N/A;     reports that he has  quit smoking. He has never used smokeless tobacco. He reports that he does not currently use drugs after having used the following drugs: Cocaine. He reports that he does not drink alcohol. Social History  Allergies  Allergen Reactions   Other Anaphylaxis, Itching and Swelling    Allergy to GRITS , unknown exactly the specific ingredient he is allergic too    Family History   Problem Relation Age of Onset   Diabetes Other    Cancer Mother        uterus; deceased    Heart attack Mother 57   Hypertension Other    Heart attack Father 44   Stroke Sister        12/2012 age 54 y.o     Prior to Admission medications   Medication Sig Start Date End Date Taking? Authorizing Provider  amLODipine (NORVASC) 10 MG tablet Take 1 tablet (10 mg total) by mouth daily. 07/05/20 10/03/20  [place] Donnamarie Poag, DO  amLODipine (NORVASC) 10 MG tablet Take 10 mg by mouth daily.    [provider]  aspirin 81 MG chewable tablet Chew by mouth daily.    [provider]  atorvastatin (LIPITOR) 80 MG tablet Take 1 tablet (80 mg total) by mouth at bedtime. 07/05/20 10/03/20  [place] Donnamarie Poag, DO  atorvastatin (LIPITOR) 80 MG tablet Take 80 mg by mouth at bedtime.    [provider]  carvedilol (COREG) 12.5 MG tablet Take 1 tablet (12.5 mg total) by mouth 2 (two) times daily with a meal. 09/07/20 09/02/21  Antonieta Pert, MD  empagliflozin (JARDIANCE) 10 MG TABS tablet Take 1 tablet (10 mg total) by mouth daily. 09/23/20   [name], [name], DO  Ensure (ENSURE) Take 237 mLs by mouth 5 (five) times daily.    [provider]  ferrous sulfate 325 (65 FE) MG tablet Take 1 tablet (325 mg total) by mouth daily. 09/23/20   [name], Georgina Quint Latif, DO  insulin aspart (NOVOLOG) 100 UNIT/ML injection Inject 2-12 Units into the skin See admin instructions. Check and record blood sugar before meals and inject per ssi. 151-200 = 2 units  201-250 = 4 units  251-300 = 6 units  301-350 = 8 units  351-400 = 10 units  greater than 400 = 12 units and call MD. 09/07/20   Antonieta Pert, MD  insulin glargine (LANTUS) 100 UNIT/ML injection Inject 0.15 mLs (15 Units total) into the skin at bedtime. 09/22/20   [name], [name], DO  isosorbide-hydrALAZINE (BIDIL) 20-37.5 MG tablet Take 1 tablet by mouth 3 (three) times daily. 09/07/20   Antonieta Pert, MD  loperamide (IMODIUM) 2 MG capsule Take 1 capsule  (2 mg total) by mouth as needed for diarrhea or loose stools. 09/22/20   [name], [name], DO  NON FORMULARY Take 237 mLs by mouth in the morning and at bedtime. ensure drink    [provider]  ondansetron (ZOFRAN ODT) 4 MG disintegrating tablet 4mg  ODT q4 hours prn nausea/vomit Patient taking differently: Take 4 mg by mouth every 4 (four) hours as needed for nausea or vomiting. 07/11/20   Drenda Freeze, MD  pantoprazole (PROTONIX) 40 MG tablet Take 40 mg by mouth daily.    [provider]  polyethylene glycol (MIRALAX / GLYCOLAX) 17 g packet Take 17 g by mouth daily as needed. Patient taking differently: Take 17 g by mouth daily as needed for mild constipation. 03/30/20   [name], NP  potassium chloride (KLOR-CON) 10 MEQ tablet  Take 20 mEq by mouth 2 (two) times daily.    [provider]  potassium chloride SA (KLOR-CON) 20 MEQ tablet Take 1 tablet (20 mEq total) by mouth 2 (two) times daily. Patient not taking: Reported on 10/24/2020 09/07/20   Antonieta Pert, MD  pregabalin (LYRICA) 75 MG capsule Take 1 capsule (75 mg total) by mouth 2 (two) times daily. 07/05/20 10/03/20  [place] Donnamarie Poag, DO  pregabalin (LYRICA) 75 MG capsule Take 75 mg by mouth 2 (two) times daily.    [provider]  saccharomyces boulardii (FLORASTOR) 250 MG capsule Take 1 capsule (250 mg total) by mouth 2 (two) times daily. 09/07/20   Antonieta Pert, MD  sacubitril-valsartan (ENTRESTO) 49-51 MG Take 1 tablet by mouth 2 (two) times daily.    [provider]  senna-docusate (SENOKOT-S) 8.6-50 MG tablet Take 1 tablet by mouth daily.    [provider]  sertraline (ZOLOFT) 50 MG tablet Take 50 mg by mouth daily.    [provider]  tamsulosin (FLOMAX) 0.4 MG CAPS capsule Take 0.8 mg by mouth daily.    [provider]  torsemide (DEMADEX) 20 MG tablet Take 40 mg by mouth 2 (two) times daily.    [provider]  torsemide 40 MG TABS Take 40 mg by  mouth 2 (two) times daily. Adjust Lasix dose based upon his weight and renal function in a week at [facility] 09/14/20 10/14/20  Antonieta Pert, MD  Vitamin D, Ergocalciferol, (DRISDOL) 1.25 MG (50000 UNIT) CAPS capsule Take 50,000 Units by mouth every 7 (seven) days.    [provider]    Physical Exam: Vitals:   11/18/20 1917 11/18/20 1945 11/18/20 2015 11/18/20 2143  BP: (!) 187/96 (!) 191/94 (!) 166/88 (!) 198/109  Pulse: 86 82 76 79  Resp: 14 12 13 17   Temp:   98.9 F (37.2 C) 97.7 F (36.5 C)  TempSrc:   Rectal Oral  SpO2: 100% 97% 94% 97%    Constitutional: Morbidly obese male laying in bed with eyes closed in discomfort with large significantly distended abdomen Vitals:   11/18/20 1917 11/18/20 1945 11/18/20 2015 11/18/20 2143  BP: (!) 187/96 (!) 191/94 (!) 166/88 (!) 198/109  Pulse: 86 82 76 79  Resp: 14 12 13 17   Temp:   98.9 F (37.2 C) 97.7 F (36.5 C)  TempSrc:   Rectal Oral  SpO2: 100% 97% 94% 97%   Eyes: PERRL, lids and conjunctivae normal ENMT: Mucous membranes are moist.  Neck: normal, supple Respiratory: clear to auscultation bilaterally, no wheezing, no crackles. Normal respiratory effort. No accessory muscle use.  Cardiovascular: Regular rate and rhythm, 3 out of 6 systolic murmur heard best on the right sternal border.  No lower extremity edema.   Abdomen: Large significantly distended abdomen with diffuse tenderness to minimal palpation worse on the left without any guarding, rebound tenderness or rigidity.  Hyperactive bowel sounds on the left. musculoskeletal: no clubbing / cyanosis. No joint deformity upper and lower extremities. Good ROM, no contractures. Normal muscle tone.  Skin: no rashes, lesions, ulcers. No induration Neurologic: CN 2-12 grossly intact. Sensation intact, Strength 5/5 in all 4.  Psychiatric: Normal judgment and insight. Alert and oriented x 3. Normal mood.     Labs on Admission: I have personally reviewed following labs and imaging  studies  CBC: Recent Labs  Lab 11/17/20 1808 11/18/20 1503 11/18/20 1627  WBC 4.5 4.1  --   NEUTROABS  --  2.7  --  HGB 10.6* 11.0* 11.9*  HCT 34.0* 35.8* 35.0*  MCV 78.9* 80.1  --   PLT 188 208  --    Basic Metabolic Panel: Recent Labs  Lab 11/17/20 1808 11/18/20 1503 11/18/20 1627  NA 143 141 145  K 3.0* 3.1* 3.2*  CL 110 106 107  CO2 24 24  --   GLUCOSE 159* 211* 214*  BUN 40* 42* 38*  CREATININE 1.25* 1.48* 1.60*  CALCIUM 9.5 9.3  --   MG  --  2.3  --    GFR: CrCl cannot be calculated (Unknown ideal weight.). Liver Function Tests: Recent Labs  Lab 11/17/20 1808 11/18/20 1503  AST 15 15  ALT 13 14  ALKPHOS 73 77  BILITOT 1.1 1.0  PROT 7.8 8.1  ALBUMIN 3.8 3.7   Recent Labs  Lab 11/17/20 1808 11/18/20 1503  LIPASE 20 21   No results for input(s): AMMONIA in the last 168 hours. Coagulation Profile: No results for input(s): INR, PROTIME in the last 168 hours. Cardiac Enzymes: No results for input(s): CKTOTAL, CKMB, CKMBINDEX, TROPONINI in the last 168 hours. BNP (last 3 results) No results for input(s): PROBNP in the last 8760 hours. HbA1C: No results for input(s): HGBA1C in the last 72 hours. CBG: No results for input(s): GLUCAP in the last 168 hours. Lipid Profile: No results for input(s): CHOL, HDL, LDLCALC, TRIG, CHOLHDL, LDLDIRECT in the last 72 hours. Thyroid Function Tests: No results for input(s): TSH, T4TOTAL, FREET4, T3FREE, THYROIDAB in the last 72 hours. Anemia Panel: No results for input(s): VITAMINB12, FOLATE, FERRITIN, TIBC, IRON, RETICCTPCT in the last 72 hours. Urine analysis:    Component Value Date/Time   COLORURINE YELLOW 11/18/2020 1652   APPEARANCEUR CLEAR 11/18/2020 1652   APPEARANCEUR Clear 12/31/2013 2116   LABSPEC 1.015 11/18/2020 1652   LABSPEC 1.032 12/31/2013 2116   PHURINE 6.0 11/18/2020 1652   GLUCOSEU >=500 (A) 11/18/2020 1652   GLUCOSEU >=500 12/31/2013 2116   HGBUR SMALL (A) 11/18/2020 1652    BILIRUBINUR NEGATIVE 11/18/2020 1652   BILIRUBINUR Negative 12/31/2013 2116   KETONESUR NEGATIVE 11/18/2020 1652   PROTEINUR 100 (A) 11/18/2020 1652   UROBILINOGEN 1.0 08/28/2013 1709   NITRITE NEGATIVE 11/18/2020 1652   LEUKOCYTESUR NEGATIVE 11/18/2020 1652   LEUKOCYTESUR Negative 12/31/2013 2116    Radiological Exams on Admission: DG Chest 1 View  Result Date: 11/17/2020 CLINICAL DATA:  Shortness of breath.  Abdominal pain and distension. EXAM: CHEST  1 VIEW COMPARISON:  Chest radiograph head CT 10/24/2020. FINDINGS: Patient is rotated. Stable cardiomegaly. Stable aortic tortuosity and atherosclerosis. No acute airspace disease, pleural effusion, or pneumothorax. Gaseous distention of stomach in the left upper quadrant is similar to prior exam. No acute osseous abnormalities are seen. IMPRESSION: No acute findings. Stable low lung volumes with cardiomegaly and aortic tortuosity. Electronically Signed   By: Keith Rake M.D.   On: 11/17/2020 19:20   CT ABDOMEN PELVIS W CONTRAST  Result Date: 11/17/2020 CLINICAL DATA:  Distension pain EXAM: CT ABDOMEN AND PELVIS WITH CONTRAST TECHNIQUE: Multidetector CT imaging of the abdomen and pelvis was performed using the standard protocol following bolus administration of intravenous contrast. CONTRAST:  56mL OMNIPAQUE IOHEXOL 350 MG/ML SOLN COMPARISON:  CT 08/30/2020, radiograph 08/30/2020, CT 08/18/2020, CT 01/05/2020 FINDINGS: Lower chest: Lung bases demonstrate no acute consolidation or effusion. Small pericardial effusion measuring up to 18 mm in thickness. Borderline to mild cardiomegaly. Gynecomastia. Hepatobiliary: Gallstone. No biliary dilatation or focal hepatic abnormality. Pancreas: Unremarkable. No pancreatic ductal dilatation or surrounding  inflammatory changes. Spleen: Normal in size without focal abnormality. Adrenals/Urinary Tract: Adrenal glands are unremarkable. Kidneys are normal, without renal calculi, focal lesion, or hydronephrosis.  Bladder is unremarkable. Stomach/Bowel: The stomach is nonenlarged. No dilated small bowel. Considerable diffuse air distension of the colon without acute wall thickening. No definite mesenteric twisting to suggest volvulus. Moderate stool in the rectum Vascular/Lymphatic: Mild aortic atherosclerosis. No aneurysm. No suspicious nodes Reproductive: Prostate is unremarkable. Other: Negative for free air or effusion Musculoskeletal: No acute or suspicious osseous abnormality. IMPRESSION: 1. Considerable diffuse air distension of the colon but without evidence for volvulus, bowel wall thickening, or obstruction. 2. Gallstones Electronically Signed   By: Donavan Foil M.D.   On: 11/17/2020 20:28   DG Abd Acute W/Chest  Result Date: 11/18/2020 CLINICAL DATA:  Ileus EXAM: DG ABDOMEN ACUTE WITH 1 VIEW CHEST COMPARISON:  CT 11/17/2020, 08/30/2020 FINDINGS: Single-view chest demonstrates low lung volumes. No focal opacity or pleural effusion. Stable cardiomegaly. No pneumothorax. Supine and upright views of the abdomen demonstrate no free air beneath the diaphragm. No significant change in moderate to marked diffuse air distension of the colon. Rectosigmoid colon distension up to 12.4 cm. Excreted contrast in the urinary bladder. IMPRESSION: 1. Low lung volumes.  Cardiomegaly. 2. No significant change in moderate to marked diffuse air distension of the colon with colon distension up to 12.4 cm. Electronically Signed   By: Donavan Foil M.D.   On: 11/18/2020 16:27      Assessment/Plan  Ogilvie syndrome/colonic pseudo-obstruction -Keep n.p.o. -Surgery is following has recommended placement of NG tube if patient has nausea and vomiting however patient has expressed that he does not want this done if possible -Minimize narcotics -Administer glycerin suppository -GI has been consulted by ED physician and will follow in consultation in the morning -Check TSH and C. difficile if there is any diarrhea -Repeat KUB in  the morning  Hypokalemia -Possibly secondary to decreased p.o. intake and chronic diuresis -Admitted on potassium 3.2 -Repleted with IV potassium 95meQ x 4 in the ED   AKI on CKD stage IIIa -Creatinine of 1.60 from a baseline of around 1.2 -Received small 250 cc bolus.   -Continue gentle IV fluid hydration overnight monitor fluid status closely due to chronic diastolic heart failure  Hypertension - Uncontrolled since patient has not been able to tolerate p.o. Give IV Labetalol PRN 5mg  q4hr  -Has nitro paste started in ED  HFpEF -Last echocardiogram 07/2018 with EF of 65 to 70% and grade 2 diastolic dysfunction with small pericardial effusion.  Mild aortic valve stenosis. -continue Entresto, Coreg when able   Type 2 diabetes - Placed on sensitive sliding scale while n.p.o.  History of CAD - Continue aspirin, statin and Coreg when able to tolerate p.o.  Depression - Continue sertraline when able tolerate p.o.   BPH - Continue tamsulosin  DVT prophylaxis:SCD Code Status: Full Family Communication: Plan discussed with patient at bedside  disposition Plan: Home with observation Consults called: General surgery, GI Admission status: Observation  Level of care: Progressive  Status is: Observation  The patient remains OBS appropriate and will d/c before 2 midnights.  Dispo: The patient is from: SNF              Anticipated d/c is to: SNF              Patient currently is not medically stable to d/c.   Difficult to place patient No         Royal Piedra T  Reeda Soohoo DO [facility]   If 7PM-7AM, please contact night-coverage [email or web address]   11/18/2020, 9:50 PM

## 2020-11-18 NOTE — ED Triage Notes (Signed)
Pt arrives via EMS from [place] for ongoing abdominal pain and abdominal distention. Seen here yesterday for the same.

## 2020-11-18 NOTE — Consult Note (Signed)
[name] [birth year]  [id number].    Requesting MD: Dr. Doren Custard Chief Complaint/Reason for Consult: abdominal distension  HPI:  [name] is a 60 yo male with a history of CHF, DM, and HTN who presented to the ED with abdominal pain and distention.  He says the symptoms began about a week ago, and his pain has been getting worse.  He usually has a bowel movement every 1 to 2 days, and reports his last bowel movement was several days ago.  He endorses occasional nausea but no vomiting.  He was seen in the ED yesterday for pain and distention, at which time a CT scan showed diffuse colonic distention but no signs of bowel obstruction.  He apparently had a bowel movement in the ED and was stable and was subsequently discharged home.  He has had ongoing pain and distention today and return to the ED.  Abdominal plain films show ongoing distention of the colon up to 12 cm.  General surgery was consulted.  Of note the patient has had prior similar episodes of colonic distention, most recently in July, at which time he was managed nonoperatively.  He previously required colonoscopic decompression in November 2021 by Dr. Juanetta Beets.  ROS: Review of Systems  Constitutional:  Negative for chills and fever.  Respiratory:  Negative for shortness of breath.   Cardiovascular:  Negative for chest pain.  Gastrointestinal:  Positive for abdominal pain and nausea. Negative for blood in stool and vomiting.  Skin:  Negative for rash.  Neurological:  Negative for seizures and weakness.   Family History  Problem Relation Age of Onset   Diabetes Other    Cancer Mother        uterus; deceased    Heart attack Mother 47   Hypertension Other    Heart attack Father 58   Stroke Sister        12/2012 age 27 y.o    Past Medical History:  Diagnosis Date   Arthritis    Bipolar affective ([place])    CHF (congestive heart failure) ([place])    Claustrophobia    Severe   Depression    Diabetes mellitus     uncontrolled    Heart murmur    Hypertension     Past Surgical History:  Procedure Laterality Date   BOWEL DECOMPRESSION N/A 01/19/2020   Procedure: BOWEL DECOMPRESSION;  Surgeon: Otis Brace, MD;  Location: [facility];  Service: Gastroenterology;  Laterality: N/A;   COLONOSCOPY WITH PROPOFOL N/A 01/19/2020   Procedure: COLONOSCOPY WITH PROPOFOL;  Surgeon: Otis Brace, MD;  Location: [facility];  Service: Gastroenterology;  Laterality: N/A;   KNEE SURGERY     b/l knees; pending total knee    RADIOLOGY WITH ANESTHESIA N/A 07/04/2020   Procedure: MRI WITH ANESTHESIA BRAIN WITHOUT CONTRAST AND HEAD WITHOUT CONTRAST;  Surgeon: Radiologist, Medication, MD;  Location: [facility];  Service: Radiology;  Laterality: N/A;    Social History:  reports that he has quit smoking. He has never used smokeless tobacco. He reports that he does not currently use drugs after having used the following drugs: Cocaine. He reports that he does not drink alcohol.  Allergies:  Allergies  Allergen Reactions   Other Anaphylaxis, Itching and Swelling    Allergy to GRITS , unknown exactly the specific ingredient he is allergic too    (Not in a hospital admission)    Physical Exam: Blood pressure (!) 166/88, pulse 76, temperature 98.9 F (37.2 C), temperature source  Rectal, resp. rate 13, SpO2 94 %. General: resting comfortably, appears stated age, no apparent distress Neurological: alert and oriented, no focal deficits, cranial nerves grossly in tact HEENT: normocephalic, atraumatic, oropharynx clear, no scleral icterus CV: regular rate and rhythm, extremities warm and well-perfused Respiratory: normal work of breathing on room air, symmetric chest wall expansion Abdomen: Abdomen is markedly distended with tenderness to palpation but soft.  No surgical scars. Extremities: warm and well-perfused, no deformities, moving all extremities spontaneously Skin: warm and dry, no jaundice, no rashes or  lesions   Results for orders placed or performed during the hospital encounter of 11/18/20 (from the past 48 hour(s))  Comprehensive metabolic panel     Status: Abnormal   Collection Time: 11/18/20  3:03 PM  Result Value Ref Range   Sodium 141 135 - 145 mmol/L   Potassium 3.1 (L) 3.5 - 5.1 mmol/L   Chloride 106 98 - 111 mmol/L   CO2 24 22 - 32 mmol/L   Glucose, Bld 211 (H) 70 - 99 mg/dL    Comment: Glucose reference range applies only to samples taken after fasting for at least 8 hours.   BUN 42 (H) 6 - 20 mg/dL   Creatinine, Ser 1.48 (H) 0.61 - 1.24 mg/dL   Calcium 9.3 8.9 - 10.3 mg/dL   Total Protein 8.1 6.5 - 8.1 g/dL   Albumin 3.7 3.5 - 5.0 g/dL   AST 15 15 - 41 U/L   ALT 14 0 - 44 U/L   Alkaline Phosphatase 77 38 - 126 U/L   Total Bilirubin 1.0 0.3 - 1.2 mg/dL   GFR, Estimated 54 (L) >60 mL/min    Comment: (NOTE) Calculated using the CKD-EPI Creatinine Equation (2021)    Anion gap 11 5 - 15    Comment: Performed at [facility], [place] [address], [place], [place] [address]  Lipase, blood     Status: None   Collection Time: 11/18/20  3:03 PM  Result Value Ref Range   Lipase 21 11 - 51 U/L    Comment: Performed at [facility], [place] [address] [place], [place] [address]  CBC with Diff     Status: Abnormal   Collection Time: 11/18/20  3:03 PM  Result Value Ref Range   WBC 4.1 4.0 - 10.5 K/uL   RBC 4.47 4.22 - 5.81 MIL/uL   Hemoglobin 11.0 (L) 13.0 - 17.0 g/dL   HCT 35.8 (L) 39.0 - 52.0 %   MCV 80.1 80.0 - 100.0 fL   MCH 24.6 (L) 26.0 - 34.0 pg   MCHC 30.7 30.0 - 36.0 g/dL   RDW 17.0 (H) 11.5 - 15.5 %   Platelets 208 150 - 400 K/uL   nRBC 0.0 0.0 - 0.2 %   Neutrophils Relative % 65 %   Neutro Abs 2.7 1.7 - 7.7 K/uL   Lymphocytes Relative 24 %   Lymphs Abs 1.0 0.7 - 4.0 K/uL   Monocytes Relative 9 %   Monocytes Absolute 0.4 0.1 - 1.0 K/uL   Eosinophils Relative 1 %   Eosinophils Absolute 0.0 0.0 - 0.5 K/uL   Basophils Relative  1 %   Basophils Absolute 0.0 0.0 - 0.1 K/uL   Immature Granulocytes 0 %   Abs Immature Granulocytes 0.01 0.00 - 0.07 K/uL    Comment: Performed at [facility], [place] [address], [place], [place] [address]  Magnesium     Status: None   Collection Time: 11/18/20  3:03  PM  Result Value Ref Range   Magnesium 2.3 1.7 - 2.4 mg/dL    Comment: Performed at [facility], [place] [address], [place], [place] [address]  Lactic acid, plasma     Status: None   Collection Time: 11/18/20  3:03 PM  Result Value Ref Range   Lactic Acid, Venous 1.0 0.5 - 1.9 mmol/L    Comment: Performed at [facility], [place] [address] [place], [place] [address]  Troponin I (High Sensitivity)     Status: Abnormal   Collection Time: 11/18/20  3:03 PM  Result Value Ref Range   Troponin I (High Sensitivity) 37 (H) <18 ng/L    Comment: (NOTE) Elevated high sensitivity troponin I (hsTnI) values and significant  changes across serial measurements may suggest ACS but many other  chronic and acute conditions are known to elevate hsTnI results.  Refer to the "Links" section for chest pain algorithms and additional  guidance. Performed at [facility], [place] [address], , [place] [address]   I-Stat Chem 8, ED     Status: Abnormal   Collection Time: 11/18/20  4:27 PM  Result Value Ref Range   Sodium 145 135 - 145 mmol/L   Potassium 3.2 (L) 3.5 - 5.1 mmol/L   Chloride 107 98 - 111 mmol/L   BUN 38 (H) 6 - 20 mg/dL   Creatinine, Ser 1.60 (H) 0.61 - 1.24 mg/dL   Glucose, Bld 214 (H) 70 - 99 mg/dL    Comment: Glucose reference range applies only to samples taken after fasting for at least 8 hours.   Calcium, Ion 1.21 1.15 - 1.40 mmol/L   TCO2 26 22 - 32 mmol/L   Hemoglobin 11.9 (L) 13.0 - 17.0 g/dL   HCT 35.0 (L) 39.0 - 52.0 %  Urinalysis, Routine w reflex microscopic     Status: Abnormal   Collection Time: 11/18/20  4:52 PM  Result Value Ref Range    Color, Urine YELLOW YELLOW   APPearance CLEAR CLEAR   Specific Gravity, Urine 1.015 1.005 - 1.030   pH 6.0 5.0 - 8.0   Glucose, UA >=500 (A) NEGATIVE mg/dL   Hgb urine dipstick SMALL (A) NEGATIVE   Bilirubin Urine NEGATIVE NEGATIVE   Ketones, ur NEGATIVE NEGATIVE mg/dL   Protein, ur 100 (A) NEGATIVE mg/dL   Nitrite NEGATIVE NEGATIVE   Leukocytes,Ua NEGATIVE NEGATIVE   RBC / HPF 0-5 0 - 5 RBC/hpf   WBC, UA 0-5 0 - 5 WBC/hpf   Bacteria, UA NONE SEEN NONE SEEN   Squamous Epithelial / LPF 0-5 0 - 5    Comment: Performed at [facility], [place] [address], [place], [place] [address]  Troponin I (High Sensitivity)     Status: Abnormal   Collection Time: 11/18/20  6:15 PM  Result Value Ref Range   Troponin I (High Sensitivity) 45 (H) <18 ng/L    Comment: (NOTE) Elevated high sensitivity troponin I (hsTnI) values and significant  changes across serial measurements may suggest ACS but many other  chronic and acute conditions are known to elevate hsTnI results.  Refer to the "Links" section for chest pain algorithms and additional  guidance. Performed at [facility], [place] [address] [place],  [address]   POC occult blood, ED     Status: None   Collection Time: 11/18/20  7:47 PM  Result Value Ref Range   Fecal Occult Bld NEGATIVE NEGATIVE   DG Chest 1 View  Result Date: 11/17/2020 CLINICAL DATA:  Shortness of breath.  Abdominal pain and distension. EXAM: CHEST  1 VIEW COMPARISON:  Chest radiograph head CT 10/24/2020. FINDINGS: Patient is rotated. Stable cardiomegaly. Stable aortic tortuosity and atherosclerosis. No acute airspace disease, pleural effusion, or pneumothorax. Gaseous distention of stomach in the left upper quadrant is similar to prior exam. No acute osseous abnormalities are seen. IMPRESSION: No acute findings. Stable low lung volumes with cardiomegaly and aortic tortuosity. Electronically Signed   By: Keith Rake M.D.   On:  11/17/2020 19:20   CT ABDOMEN PELVIS W CONTRAST  Result Date: 11/17/2020 CLINICAL DATA:  Distension pain EXAM: CT ABDOMEN AND PELVIS WITH CONTRAST TECHNIQUE: Multidetector CT imaging of the abdomen and pelvis was performed using the standard protocol following bolus administration of intravenous contrast. CONTRAST:  48mL OMNIPAQUE IOHEXOL 350 MG/ML SOLN COMPARISON:  CT 08/30/2020, radiograph 08/30/2020, CT 08/18/2020, CT 01/05/2020 FINDINGS: Lower chest: Lung bases demonstrate no acute consolidation or effusion. Small pericardial effusion measuring up to 18 mm in thickness. Borderline to mild cardiomegaly. Gynecomastia. Hepatobiliary: Gallstone. No biliary dilatation or focal hepatic abnormality. Pancreas: Unremarkable. No pancreatic ductal dilatation or surrounding inflammatory changes. Spleen: Normal in size without focal abnormality. Adrenals/Urinary Tract: Adrenal glands are unremarkable. Kidneys are normal, without renal calculi, focal lesion, or hydronephrosis. Bladder is unremarkable. Stomach/Bowel: The stomach is nonenlarged. No dilated small bowel. Considerable diffuse air distension of the colon without acute wall thickening. No definite mesenteric twisting to suggest volvulus. Moderate stool in the rectum Vascular/Lymphatic: Mild aortic atherosclerosis. No aneurysm. No suspicious nodes Reproductive: Prostate is unremarkable. Other: Negative for free air or effusion Musculoskeletal: No acute or suspicious osseous abnormality. IMPRESSION: 1. Considerable diffuse air distension of the colon but without evidence for volvulus, bowel wall thickening, or obstruction. 2. Gallstones Electronically Signed   By: Donavan Foil M.D.   On: 11/17/2020 20:28   DG Abd Acute W/Chest  Result Date: 11/18/2020 CLINICAL DATA:  Ileus EXAM: DG ABDOMEN ACUTE WITH 1 VIEW CHEST COMPARISON:  CT 11/17/2020, 08/30/2020 FINDINGS: Single-view chest demonstrates low lung volumes. No focal opacity or pleural effusion. Stable  cardiomegaly. No pneumothorax. Supine and upright views of the abdomen demonstrate no free air beneath the diaphragm. No significant change in moderate to marked diffuse air distension of the colon. Rectosigmoid colon distension up to 12.4 cm. Excreted contrast in the urinary bladder. IMPRESSION: 1. Low lung volumes.  Cardiomegaly. 2. No significant change in moderate to marked diffuse air distension of the colon with colon distension up to 12.4 cm. Electronically Signed   By: Donavan Foil M.D.   On: 11/18/2020 16:27      Assessment/Plan This is a 60 year old male presenting with a week of progressive abdominal distention and pain.  I reviewed his CT scan and abdominal plain film.  His CT scan shows significant distention of the colon particularly the descending colon, with a large amount of stool in the rectum.  Small bowel is decompressed.  His plain film from today shows similar findings.  There is no sign of volvulus, obstruction or mass lesion.  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  He does not currently have signs of ischemia or bowel perforation, so no surgical intervention is currently warranted.  -Keep NPO, place NG tube if patient has nausea or vomiting -Replete potassium, maintain normal electrolytes -Minimize narcotics -Administer suppository -Consult GI for consideration of colonoscopic decompression -Repeat abdominal XR in am -Surgery will follow  Michaelle Birks, MD [facility] Surgery  General, Hepatobiliary and Pancreatic Surgery 11/18/20 9:07 PM

## 2020-11-18 NOTE — ED Notes (Signed)
Provider at the bedside.  

## 2020-11-19 ENCOUNTER — Encounter (HOSPITAL_COMMUNITY)
Admission: EM | Disposition: A | Discharge status: Skilled Nursing Facility | Payer: Self-pay | Source: Skilled Nursing Facility | Attending: Internal Medicine

## 2020-11-19 ENCOUNTER — Observation Stay (HOSPITAL_COMMUNITY): Payer: Medicare HMO | Admitting: Anesthesiology

## 2020-11-19 ENCOUNTER — Observation Stay (HOSPITAL_COMMUNITY): Payer: Medicare HMO

## 2020-11-19 ENCOUNTER — Encounter (HOSPITAL_COMMUNITY): Payer: Self-pay | Admitting: Internal Medicine

## 2020-11-19 DIAGNOSIS — Z4682 Encounter for fitting and adjustment of non-vascular catheter: Secondary | ICD-10-CM | POA: Diagnosis not present

## 2020-11-19 DIAGNOSIS — D631 Anemia in chronic kidney disease: Secondary | ICD-10-CM | POA: Diagnosis not present

## 2020-11-19 DIAGNOSIS — Z7401 Bed confinement status: Secondary | ICD-10-CM | POA: Diagnosis not present

## 2020-11-19 DIAGNOSIS — E871 Hypo-osmolality and hyponatremia: Secondary | ICD-10-CM | POA: Diagnosis not present

## 2020-11-19 DIAGNOSIS — Q438 Other specified congenital malformations of intestine: Secondary | ICD-10-CM | POA: Diagnosis not present

## 2020-11-19 DIAGNOSIS — F32A Depression, unspecified: Secondary | ICD-10-CM | POA: Diagnosis not present

## 2020-11-19 DIAGNOSIS — K567 Ileus, unspecified: Secondary | ICD-10-CM | POA: Diagnosis not present

## 2020-11-19 DIAGNOSIS — K5981 Ogilvie syndrome: Secondary | ICD-10-CM | POA: Diagnosis not present

## 2020-11-19 DIAGNOSIS — D696 Thrombocytopenia, unspecified: Secondary | ICD-10-CM | POA: Diagnosis not present

## 2020-11-19 DIAGNOSIS — E7800 Pure hypercholesterolemia, unspecified: Secondary | ICD-10-CM | POA: Diagnosis not present

## 2020-11-19 DIAGNOSIS — R1084 Generalized abdominal pain: Secondary | ICD-10-CM | POA: Diagnosis present

## 2020-11-19 DIAGNOSIS — R194 Change in bowel habit: Secondary | ICD-10-CM | POA: Diagnosis not present

## 2020-11-19 DIAGNOSIS — E1140 Type 2 diabetes mellitus with diabetic neuropathy, unspecified: Secondary | ICD-10-CM | POA: Diagnosis not present

## 2020-11-19 DIAGNOSIS — N400 Enlarged prostate without lower urinary tract symptoms: Secondary | ICD-10-CM | POA: Diagnosis not present

## 2020-11-19 DIAGNOSIS — I69351 Hemiplegia and hemiparesis following cerebral infarction affecting right dominant side: Secondary | ICD-10-CM | POA: Diagnosis not present

## 2020-11-19 DIAGNOSIS — Z6841 Body Mass Index (BMI) 40.0 and over, adult: Secondary | ICD-10-CM | POA: Diagnosis not present

## 2020-11-19 DIAGNOSIS — L8990 Pressure ulcer of unspecified site, unspecified stage: Secondary | ICD-10-CM | POA: Insufficient documentation

## 2020-11-19 DIAGNOSIS — R933 Abnormal findings on diagnostic imaging of other parts of digestive tract: Secondary | ICD-10-CM | POA: Diagnosis not present

## 2020-11-19 DIAGNOSIS — K3189 Other diseases of stomach and duodenum: Secondary | ICD-10-CM | POA: Diagnosis not present

## 2020-11-19 DIAGNOSIS — I10 Essential (primary) hypertension: Secondary | ICD-10-CM | POA: Diagnosis not present

## 2020-11-19 DIAGNOSIS — K5939 Other megacolon: Secondary | ICD-10-CM | POA: Diagnosis not present

## 2020-11-19 DIAGNOSIS — I130 Hypertensive heart and chronic kidney disease with heart failure and stage 1 through stage 4 chronic kidney disease, or unspecified chronic kidney disease: Secondary | ICD-10-CM | POA: Diagnosis not present

## 2020-11-19 DIAGNOSIS — I959 Hypotension, unspecified: Secondary | ICD-10-CM | POA: Diagnosis not present

## 2020-11-19 DIAGNOSIS — I5032 Chronic diastolic (congestive) heart failure: Secondary | ICD-10-CM | POA: Diagnosis not present

## 2020-11-19 DIAGNOSIS — K6389 Other specified diseases of intestine: Secondary | ICD-10-CM | POA: Diagnosis not present

## 2020-11-19 DIAGNOSIS — I3139 Other pericardial effusion (noninflammatory): Secondary | ICD-10-CM | POA: Diagnosis not present

## 2020-11-19 DIAGNOSIS — R109 Unspecified abdominal pain: Secondary | ICD-10-CM | POA: Diagnosis not present

## 2020-11-19 DIAGNOSIS — N179 Acute kidney failure, unspecified: Secondary | ICD-10-CM | POA: Diagnosis not present

## 2020-11-19 DIAGNOSIS — E870 Hyperosmolality and hypernatremia: Secondary | ICD-10-CM | POA: Diagnosis not present

## 2020-11-19 DIAGNOSIS — E876 Hypokalemia: Secondary | ICD-10-CM | POA: Diagnosis not present

## 2020-11-19 DIAGNOSIS — I5043 Acute on chronic combined systolic (congestive) and diastolic (congestive) heart failure: Secondary | ICD-10-CM | POA: Diagnosis not present

## 2020-11-19 DIAGNOSIS — R935 Abnormal findings on diagnostic imaging of other abdominal regions, including retroperitoneum: Secondary | ICD-10-CM | POA: Diagnosis not present

## 2020-11-19 DIAGNOSIS — Z20822 Contact with and (suspected) exposure to covid-19: Secondary | ICD-10-CM | POA: Diagnosis not present

## 2020-11-19 DIAGNOSIS — E1122 Type 2 diabetes mellitus with diabetic chronic kidney disease: Secondary | ICD-10-CM | POA: Diagnosis not present

## 2020-11-19 DIAGNOSIS — F319 Bipolar disorder, unspecified: Secondary | ICD-10-CM | POA: Diagnosis not present

## 2020-11-19 DIAGNOSIS — I5189 Other ill-defined heart diseases: Secondary | ICD-10-CM | POA: Diagnosis not present

## 2020-11-19 DIAGNOSIS — N1831 Chronic kidney disease, stage 3a: Secondary | ICD-10-CM | POA: Diagnosis not present

## 2020-11-19 DIAGNOSIS — R140 Abdominal distension (gaseous): Secondary | ICD-10-CM | POA: Diagnosis not present

## 2020-11-19 DIAGNOSIS — K560 Paralytic ileus: Secondary | ICD-10-CM | POA: Diagnosis not present

## 2020-11-19 DIAGNOSIS — D509 Iron deficiency anemia, unspecified: Secondary | ICD-10-CM | POA: Diagnosis not present

## 2020-11-19 DIAGNOSIS — X58XXXA Exposure to other specified factors, initial encounter: Secondary | ICD-10-CM | POA: Diagnosis not present

## 2020-11-19 DIAGNOSIS — E1165 Type 2 diabetes mellitus with hyperglycemia: Secondary | ICD-10-CM | POA: Diagnosis not present

## 2020-11-19 DIAGNOSIS — R69 Illness, unspecified: Secondary | ICD-10-CM | POA: Diagnosis not present

## 2020-11-19 HISTORY — PX: BOWEL DECOMPRESSION: SHX5532

## 2020-11-19 HISTORY — PX: COLONOSCOPY WITH PROPOFOL: SHX5780

## 2020-11-19 LAB — GLUCOSE, CAPILLARY
Glucose-Capillary: 194 mg/dL — ABNORMAL HIGH (ref 70–99)
Glucose-Capillary: 167 mg/dL — ABNORMAL HIGH (ref 70–99)
Glucose-Capillary: 148 mg/dL — ABNORMAL HIGH (ref 70–99)
Glucose-Capillary: 143 mg/dL — ABNORMAL HIGH (ref 70–99)

## 2020-11-19 LAB — TSH: TSH: 1.024 u[IU]/mL (ref 0.350–4.500)

## 2020-11-19 SURGERY — COLONOSCOPY WITH PROPOFOL
Anesthesia: Monitor Anesthesia Care

## 2020-11-19 MED ORDER — SUCCINYLCHOLINE CHLORIDE 200 MG/10ML IV SOSY
PREFILLED_SYRINGE | INTRAVENOUS | Status: DC | PRN
  Administered 2020-11-19: 120 mg via INTRAVENOUS

## 2020-11-19 MED ORDER — LACTATED RINGERS IV SOLN: INTRAVENOUS | Status: DC | PRN

## 2020-11-19 MED ORDER — ONDANSETRON HCL 4 MG/2ML IJ SOLN
INTRAMUSCULAR | Status: DC | PRN
  Administered 2020-11-19: 4 mg via INTRAVENOUS

## 2020-11-19 MED ORDER — FENTANYL CITRATE (PF) 100 MCG/2ML IJ SOLN
INTRAMUSCULAR | Status: AC
  Filled 2020-11-19: qty 2

## 2020-11-19 MED ORDER — LABETALOL HCL 5 MG/ML IV SOLN
INTRAVENOUS | Status: AC
  Filled 2020-11-19: qty 4

## 2020-11-19 MED ORDER — PHENYLEPHRINE HCL (PRESSORS) 10 MG/ML IV SOLN
INTRAVENOUS | Status: DC | PRN
  Administered 2020-11-19: 120 ug via INTRAVENOUS

## 2020-11-19 MED ORDER — LACTATED RINGERS IV SOLN
INTRAVENOUS | Status: AC | PRN
  Administered 2020-11-19: 20 mL/h via INTRAVENOUS

## 2020-11-19 MED ORDER — CHLORHEXIDINE GLUCONATE CLOTH 2 % EX PADS
6.0000 | MEDICATED_PAD | Freq: Every day | CUTANEOUS | Status: DC
  Administered 2020-11-19 – 2020-11-27 (×9): 6 via TOPICAL

## 2020-11-19 MED ORDER — LABETALOL HCL 5 MG/ML IV SOLN
10.0000 mg | Freq: Once | INTRAVENOUS | Status: AC
  Administered 2020-11-19: 10 mg via INTRAVENOUS

## 2020-11-19 MED ORDER — SODIUM CHLORIDE 0.9 % IV SOLN: INTRAVENOUS | Status: DC

## 2020-11-19 MED ORDER — FENTANYL CITRATE (PF) 100 MCG/2ML IJ SOLN
INTRAMUSCULAR | Status: DC | PRN
  Administered 2020-11-19 (×2): 50 ug via INTRAVENOUS

## 2020-11-19 MED ORDER — LIDOCAINE HCL (CARDIAC) PF 100 MG/5ML IV SOSY
PREFILLED_SYRINGE | INTRAVENOUS | Status: DC | PRN
  Administered 2020-11-19: 50 mg via INTRAVENOUS

## 2020-11-19 MED ORDER — INSULIN ASPART 100 UNIT/ML IJ SOLN
0.0000 [IU] | Freq: Three times a day (TID) | INTRAMUSCULAR | Status: DC
  Administered 2020-11-19 – 2020-11-27 (×17): 1 [IU] via SUBCUTANEOUS
  Administered 2020-11-27 – 2020-11-28 (×2): 2 [IU] via SUBCUTANEOUS
  Administered 2020-11-29 – 2020-12-03 (×6): 1 [IU] via SUBCUTANEOUS

## 2020-11-19 MED ORDER — LABETALOL HCL 5 MG/ML IV SOLN
INTRAVENOUS | Status: DC | PRN
  Administered 2020-11-19 (×2): 5 mg via INTRAVENOUS

## 2020-11-19 MED ORDER — ONDANSETRON HCL 4 MG/2ML IJ SOLN
4.0000 mg | Freq: Four times a day (QID) | INTRAMUSCULAR | Status: AC | PRN
Start: 2020-11-19 — End: 2020-11-20
  Administered 2020-11-20: 4 mg via INTRAVENOUS
  Filled 2020-11-19: qty 2

## 2020-11-19 MED ORDER — PROPOFOL 1000 MG/100ML IV EMUL
INTRAVENOUS | Status: AC
  Filled 2020-11-19: qty 100

## 2020-11-19 MED ORDER — ACETAMINOPHEN 325 MG PO TABS
325.0000 mg | ORAL_TABLET | ORAL | Status: DC | PRN
  Administered 2020-11-19 – 2020-12-10 (×18): 325 mg via ORAL
  Filled 2020-11-19 (×20): qty 1

## 2020-11-19 MED ORDER — KETAMINE HCL 10 MG/ML IJ SOLN: INTRAMUSCULAR | Status: DC | PRN

## 2020-11-19 MED ORDER — ONDANSETRON HCL 4 MG/2ML IJ SOLN
4.0000 mg | Freq: Three times a day (TID) | INTRAMUSCULAR | Status: DC | PRN
  Administered 2020-11-20 – 2020-12-06 (×10): 4 mg via INTRAVENOUS
  Filled 2020-11-19 (×11): qty 2

## 2020-11-19 MED ORDER — PROPOFOL 10 MG/ML IV BOLUS
INTRAVENOUS | Status: DC | PRN
  Administered 2020-11-19: 150 mg via INTRAVENOUS
  Administered 2020-11-19: 50 mg via INTRAVENOUS

## 2020-11-19 MED ORDER — OXYCODONE HCL 5 MG PO TABS
5.0000 mg | ORAL_TABLET | Freq: Once | ORAL | Status: AC | PRN
Start: 2020-11-19 — End: 2020-11-19
  Administered 2020-11-19: 5 mg via ORAL
  Filled 2020-11-19 (×2): qty 1

## 2020-11-19 MED ORDER — OXYCODONE HCL 5 MG/5ML PO SOLN
5.0000 mg | Freq: Once | ORAL | Status: AC | PRN
Start: 2020-11-19 — End: 2020-11-19
  Filled 2020-11-19: qty 5

## 2020-11-19 MED ORDER — FENTANYL CITRATE (PF) 100 MCG/2ML IJ SOLN
25.0000 ug | INTRAMUSCULAR | Status: AC | PRN
  Administered 2020-11-21 (×5): 50 ug via INTRAVENOUS
  Administered 2020-11-22: 25 ug via INTRAVENOUS
  Filled 2020-11-19 (×7): qty 2

## 2020-11-19 MED ORDER — TRAMADOL HCL 50 MG PO TABS
50.0000 mg | ORAL_TABLET | Freq: Once | ORAL | Status: AC
  Administered 2020-11-19: 50 mg via ORAL
  Filled 2020-11-19: qty 1

## 2020-11-19 SURGICAL SUPPLY — 22 items

## 2020-11-19 NOTE — H&P (View-Only) (Signed)
Referring Provider: Dr. Doren Custard Primary Care Physician:  [name], NP Primary Gastroenterologist: Althia Forts  Reason for Consultation:  Colonic distention  HPI: [name] is a 60 y.o. male with multiple medical problems as stated below here for worsening diffuse abdominal pain and distention for the past 2 weeks and CT showing significant distention of colon and large amount of stool in the rectum. No obstruction or volvulus seen on CT. Had a BM yesterday following an enema. S/P colonic decompression in 11/21 (Dr. Alessandra Bevels).   Past Medical History:  Diagnosis Date   Arthritis    Bipolar affective ([place])    CHF (congestive heart failure) ([place])    Claustrophobia    Severe   Depression    Diabetes mellitus    uncontrolled    Heart murmur    Hypertension     Past Surgical History:  Procedure Laterality Date   BOWEL DECOMPRESSION N/A 01/19/2020   Procedure: BOWEL DECOMPRESSION;  Surgeon: Otis Brace, MD;  Location: [facility];  Service: Gastroenterology;  Laterality: N/A;   COLONOSCOPY WITH PROPOFOL N/A 01/19/2020   Procedure: COLONOSCOPY WITH PROPOFOL;  Surgeon: Otis Brace, MD;  Location: [facility];  Service: Gastroenterology;  Laterality: N/A;   KNEE SURGERY     b/l knees; pending total knee    RADIOLOGY WITH ANESTHESIA N/A 07/04/2020   Procedure: MRI WITH ANESTHESIA BRAIN WITHOUT CONTRAST AND HEAD WITHOUT CONTRAST;  Surgeon: Radiologist, Medication, MD;  Location: [facility];  Service: Radiology;  Laterality: N/A;    Prior to Admission medications   Medication Sig Start Date End Date Taking? Authorizing Provider  amLODipine (NORVASC) 10 MG tablet Take 1 tablet (10 mg total) by mouth daily. 07/05/20 10/03/20  [place] Donnamarie Poag, DO  amLODipine (NORVASC) 10 MG tablet Take 10 mg by mouth daily.    [provider]  aspirin 81 MG chewable tablet Chew by mouth daily.    [provider]  atorvastatin (LIPITOR) 80 MG tablet Take 1 tablet (80 mg  total) by mouth at bedtime. 07/05/20 10/03/20  [place] Donnamarie Poag, DO  atorvastatin (LIPITOR) 80 MG tablet Take 80 mg by mouth at bedtime.    [provider]  carvedilol (COREG) 12.5 MG tablet Take 1 tablet (12.5 mg total) by mouth 2 (two) times daily with a meal. 09/07/20 09/02/21  Antonieta Pert, MD  empagliflozin (JARDIANCE) 10 MG TABS tablet Take 1 tablet (10 mg total) by mouth daily. 09/23/20   [name], [name], DO  Ensure (ENSURE) Take 237 mLs by mouth 5 (five) times daily.    [provider]  ferrous sulfate 325 (65 FE) MG tablet Take 1 tablet (325 mg total) by mouth daily. 09/23/20   [name], Georgina Quint Latif, DO  insulin aspart (NOVOLOG) 100 UNIT/ML injection Inject 2-12 Units into the skin See admin instructions. Check and record blood sugar before meals and inject per ssi. 151-200 = 2 units  201-250 = 4 units  251-300 = 6 units  301-350 = 8 units  351-400 = 10 units  greater than 400 = 12 units and call MD. 09/07/20   Antonieta Pert, MD  insulin glargine (LANTUS) 100 UNIT/ML injection Inject 0.15 mLs (15 Units total) into the skin at bedtime. 09/22/20   [name], [name], DO  isosorbide-hydrALAZINE (BIDIL) 20-37.5 MG tablet Take 1 tablet by mouth 3 (three) times daily. 09/07/20   Antonieta Pert, MD  loperamide (IMODIUM) 2 MG capsule Take 1 capsule (2 mg total) by mouth as needed for diarrhea or loose stools. 09/22/20  [name], [name], DO  NON FORMULARY Take 237 mLs by mouth in the morning and at bedtime. ensure drink    [provider]  ondansetron (ZOFRAN ODT) 4 MG disintegrating tablet 4mg  ODT q4 hours prn nausea/vomit Patient taking differently: Take 4 mg by mouth every 4 (four) hours as needed for nausea or vomiting. 07/11/20   Drenda Freeze, MD  pantoprazole (PROTONIX) 40 MG tablet Take 40 mg by mouth daily.    [provider]  polyethylene glycol (MIRALAX / GLYCOLAX) 17 g packet Take 17 g by mouth daily as needed. Patient taking differently: Take 17 g by mouth  daily as needed for mild constipation. 03/30/20   [name], NP  potassium chloride (KLOR-CON) 10 MEQ tablet Take 20 mEq by mouth 2 (two) times daily.    [provider]  potassium chloride SA (KLOR-CON) 20 MEQ tablet Take 1 tablet (20 mEq total) by mouth 2 (two) times daily. Patient not taking: Reported on 10/24/2020 09/07/20   Antonieta Pert, MD  pregabalin (LYRICA) 75 MG capsule Take 1 capsule (75 mg total) by mouth 2 (two) times daily. 07/05/20 10/03/20  [place] Donnamarie Poag, DO  pregabalin (LYRICA) 75 MG capsule Take 75 mg by mouth 2 (two) times daily.    [provider]  saccharomyces boulardii (FLORASTOR) 250 MG capsule Take 1 capsule (250 mg total) by mouth 2 (two) times daily. 09/07/20   Antonieta Pert, MD  sacubitril-valsartan (ENTRESTO) 49-51 MG Take 1 tablet by mouth 2 (two) times daily.    [provider]  senna-docusate (SENOKOT-S) 8.6-50 MG tablet Take 1 tablet by mouth daily.    [provider]  sertraline (ZOLOFT) 50 MG tablet Take 50 mg by mouth daily.    [provider]  tamsulosin (FLOMAX) 0.4 MG CAPS capsule Take 0.8 mg by mouth daily.    [provider]  torsemide (DEMADEX) 20 MG tablet Take 40 mg by mouth 2 (two) times daily.    [provider]  torsemide 40 MG TABS Take 40 mg by mouth 2 (two) times daily. Adjust Lasix dose based upon his weight and renal function in a week at [facility] 09/14/20 10/14/20  Antonieta Pert, MD  Vitamin D, Ergocalciferol, (DRISDOL) 1.25 MG (50000 UNIT) CAPS capsule Take 50,000 Units by mouth every 7 (seven) days.    [provider]    Scheduled Meds:  [MAR Hold] insulin aspart  0-6 Units Subcutaneous TID WC   Continuous Infusions:  lactated ringers 20 mL/hr (11/19/20 1248)   PRN Meds:.[MAR Hold] labetalol, lactated ringers, [MAR Hold] ondansetron (ZOFRAN) IV  Allergies as of 11/18/2020 - Review Complete 11/17/2020  Allergen Reaction Noted   Other Anaphylaxis, Itching, and Swelling  08/28/2013    Family History  Problem Relation Age of Onset   Diabetes Other    Cancer Mother        uterus; deceased    Heart attack Mother 85   Hypertension Other    Heart attack Father 65   Stroke Sister        12/2012 age 68 y.o    Social History   Socioeconomic History   Marital status: Single    Spouse name: Not on file   Number of children: Not on file   Years of education: Not on file   Highest education level: Not on file  Occupational History   Not on file  Tobacco Use   Smoking status: Former   Smokeless tobacco: Never  Vaping Use  Vaping Use: Never used  Substance and Sexual Activity   Alcohol use: No   Drug use: Not Currently    Types: Cocaine    Comment: Last use 2019   Sexual activity: Yes    Birth control/protection: Condom  Other Topics Concern   Not on file  Social History Narrative   Moved from [place]   Denies smoking, drinking   +cocaine history (denies recent use)   Originally from [place]    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  Physical Exam: Vital signs: Vitals:   11/19/20 0636 11/19/20 1229  BP: (!) 171/94   Pulse: 77 98  Resp: 16 16  Temp: 98.3 F (36.8 C) 98 F (36.7 C)  SpO2: 99% 95%   Last BM Date: 11/18/20 General:   Lethargic, well-nourished, moderate acute distress  Head:  normocephalic, atraumatic Eyes: anicteric sclera ENT: oropharynx clear Neck: supple, nontender Lungs:  Clear throughout to auscultation.   No wheezes, crackles, or rhonchi. No acute distress. Heart:  Regular rate and rhythm; no murmurs, clicks, rubs,  or gallops. Abdomen: marked distention with diffuse tenderness with guarding, decreased bowel sounds  Rectal:  Deferred Ext: chronic vascular changes  GI:  Lab Results: Recent Labs    11/17/20 1808 11/18/20 1503 11/18/20 1627  WBC 4.5 4.1  --   HGB 10.6* 11.0* 11.9*  HCT 34.0* 35.8* 35.0*  PLT 188 208  --    BMET Recent Labs    11/17/20 1808 11/18/20 1503 11/18/20 1627  NA 143 141 145  K 3.0* 3.1* 3.2*  CL 110 106 107  CO2 24 24  --   GLUCOSE 159* 211* 214*  BUN 40* 42* 38*  CREATININE 1.25* 1.48* 1.60*  CALCIUM 9.5 9.3  --    LFT Recent Labs    11/18/20 1503  PROT 8.1  ALBUMIN 3.7  AST 15  ALT 14  ALKPHOS 77  BILITOT 1.0   PT/INR No results for input(s): LABPROT, INR in the last 72 hours.   Studies/Results: DG Chest 1 View  Result Date: 11/17/2020 CLINICAL DATA:  Shortness of breath.  Abdominal pain and distension. EXAM: CHEST  1 VIEW COMPARISON:  Chest radiograph head CT 10/24/2020. FINDINGS: Patient is rotated. Stable cardiomegaly. Stable aortic tortuosity and atherosclerosis. No acute airspace disease, pleural effusion, or pneumothorax. Gaseous distention of stomach in the left upper quadrant is similar to prior exam. No acute osseous abnormalities are seen. IMPRESSION: No acute findings. Stable low lung volumes with cardiomegaly and aortic tortuosity. Electronically Signed   By: Keith Rake M.D.   On: 11/17/2020 19:20   CT ABDOMEN PELVIS W CONTRAST  Result Date: 11/17/2020 CLINICAL DATA:  Distension pain EXAM: CT ABDOMEN AND PELVIS WITH  CONTRAST TECHNIQUE: Multidetector CT imaging of the abdomen and pelvis was performed using the standard protocol following bolus administration of intravenous  contrast. CONTRAST:  69mL OMNIPAQUE IOHEXOL 350 MG/ML SOLN COMPARISON:  CT 08/30/2020, radiograph 08/30/2020, CT 08/18/2020, CT 01/05/2020 FINDINGS: Lower chest: Lung bases demonstrate no acute consolidation or effusion. Small pericardial effusion measuring up to 18 mm in thickness. Borderline to mild cardiomegaly. Gynecomastia. Hepatobiliary: Gallstone. No biliary dilatation or focal hepatic abnormality. Pancreas: Unremarkable. No pancreatic ductal dilatation or surrounding inflammatory changes. Spleen: Normal in size without focal abnormality. Adrenals/Urinary Tract: Adrenal glands are unremarkable. Kidneys are normal, without renal calculi, focal lesion, or hydronephrosis. Bladder is unremarkable. Stomach/Bowel: The stomach is nonenlarged. No dilated small bowel. Considerable diffuse air distension of the colon without acute wall thickening. No definite mesenteric twisting to suggest volvulus. Moderate stool in the rectum Vascular/Lymphatic: Mild aortic atherosclerosis. No aneurysm. No suspicious nodes Reproductive: Prostate is unremarkable. Other: Negative for free air or effusion Musculoskeletal: No acute or suspicious osseous abnormality. IMPRESSION: 1. Considerable diffuse air distension of the colon but without evidence for volvulus, bowel wall thickening, or obstruction. 2. Gallstones Electronically Signed   By: Donavan Foil M.D.   On: 11/17/2020 20:28   DG Abd 2 Views  Result Date: 11/19/2020 CLINICAL DATA:  Follow-up rectosigmoid distention. EXAM: ABDOMEN - 2 VIEW COMPARISON:  11/18/2020 FINDINGS: Persistent gaseous distension of the colon. This appears unchanged from previous exam. No significant small bowel dilatation identified. IMPRESSION: No change in gaseous distension of the colon Electronically Signed   By: Kerby Moors M.D.   On: 11/19/2020 06:21   DG Abd Acute W/Chest  Result Date: 11/18/2020 CLINICAL DATA:  Ileus EXAM: DG ABDOMEN ACUTE WITH 1 VIEW CHEST COMPARISON:  CT 11/17/2020,  08/30/2020 FINDINGS: Single-view chest demonstrates low lung volumes. No focal opacity or pleural effusion. Stable cardiomegaly. No pneumothorax. Supine and upright views of the abdomen demonstrate no free air beneath the diaphragm. No significant change in moderate to marked diffuse air distension of the colon. Rectosigmoid colon distension up to 12.4 cm. Excreted contrast in the urinary bladder. IMPRESSION: 1. Low lung volumes.  Cardiomegaly. 2. No significant change in moderate to marked diffuse air distension of the colon with colon distension up to 12.4 cm. Electronically Signed   By: Donavan Foil M.D.   On: 11/18/2020 16:27    Impression/Plan: Severe abdominal distention and CT concerning for pseudo-obstruction. Fecal impaction also possible but ongoing distention following defecation yesterday. Will attempt colonic decompression today. NPO. Supportive care.    LOS: 0 days   [name]  11/19/2020, 1:07 PM  Questions please call [phone]

## 2020-11-19 NOTE — Brief Op Note (Signed)
Successful colonic decompression of left colon. Unable to advance to transverse colon due to large amount of solid stool. Abdomen significantly less distended and softer at the completion of the procedure. See endopro note for details. NPO. Supportive care. [facility] will f/u tomorrow.

## 2020-11-19 NOTE — Anesthesia Procedure Notes (Signed)
Procedure Name: Intubation Date/Time: 11/19/2020 1:37 PM Performed by: Lissa Morales, CRNA Pre-anesthesia Checklist: Patient identified, Emergency Drugs available, Suction available and Patient being monitored Patient Re-evaluated:Patient Re-evaluated prior to induction Oxygen Delivery Method: Circle system utilized Preoxygenation: Pre-oxygenation with 100% oxygen Induction Type: IV induction, Cricoid Pressure applied and Rapid sequence Laryngoscope Size: Mac and 4 Tube type: Oral Tube size: 8.0 mm Number of attempts: 1 Airway Equipment and Method: Stylet and Oral airway Placement Confirmation: ETT inserted through vocal cords under direct vision, positive ETCO2 and breath sounds checked- equal and bilateral Secured at: 24 cm Tube secured with: Tape Dental Injury: Teeth and Oropharynx as per pre-operative assessment

## 2020-11-19 NOTE — Anesthesia Preprocedure Evaluation (Signed)
Anesthesia Evaluation  Patient identified by MRN, date of birth, ID band Patient awake    Reviewed: Allergy & Precautions, H&P , NPO status , Patient's Chart, lab work & pertinent test results  Airway Mallampati: II   Neck ROM: full    Dental   Pulmonary former smoker,    breath sounds clear to auscultation       Cardiovascular hypertension, +CHF   Rhythm:regular Rate:Normal     Neuro/Psych PSYCHIATRIC DISORDERS Anxiety Depression Bipolar Disorder  Neuromuscular disease CVA    GI/Hepatic (+)     substance abuse  cocaine use,   Endo/Other  diabetes, Type 2  Renal/GU Renal InsufficiencyRenal disease     Musculoskeletal  (+) Arthritis ,   Abdominal   Peds  Hematology   Anesthesia Other Findings   Reproductive/Obstetrics                             Anesthesia Physical Anesthesia Plan  ASA: 3  Anesthesia Plan: MAC   Post-op Pain Management:    Induction: Intravenous  PONV Risk Score and Plan: 1 and Propofol infusion and Treatment may vary due to age or medical condition  Airway Management Planned: Simple Face Mask  Additional Equipment:   Intra-op Plan:   Post-operative Plan:   Informed Consent: I have reviewed the patients History and Physical, chart, labs and discussed the procedure including the risks, benefits and alternatives for the proposed anesthesia with the patient or authorized representative who has indicated his/her understanding and acceptance.     Dental advisory given  Plan Discussed with: CRNA, Anesthesiologist and Surgeon  Anesthesia Plan Comments:         Anesthesia Quick Evaluation

## 2020-11-19 NOTE — Interval H&P Note (Signed)
History and Physical Interval Note:  11/19/2020 1:22 PM  [name]  has presented today for surgery, with the diagnosis of abdominal pain, colonic distention.  The various methods of treatment have been discussed with the patient and family. After consideration of risks, benefits and other options for treatment, the patient has consented to  Procedure(s): COLONOSCOPY WITH PROPOFOL (N/A) as a surgical intervention.  The patient's history has been reviewed, patient examined, no change in status, stable for surgery.  I have reviewed the patient's chart and labs.  Questions were answered to the patient's satisfaction.     [name]

## 2020-11-19 NOTE — Progress Notes (Signed)
Pt had Colonoscopy with washing....cleared bowels from colon, Enteric Precaution discontinued, pt does not have s/s per protocol per MD.SRP, RN

## 2020-11-19 NOTE — Consult Note (Signed)
Referring Provider: Dr. Doren Custard Primary Care Physician:  [name], NP Primary Gastroenterologist: Althia Forts  Reason for Consultation:  Colonic distention  HPI: [name] is a 60 y.o. male with multiple medical problems as stated below here for worsening diffuse abdominal pain and distention for the past 2 weeks and CT showing significant distention of colon and large amount of stool in the rectum. No obstruction or volvulus seen on CT. Had a BM yesterday following an enema. S/P colonic decompression in 11/21 (Dr. Alessandra Bevels).   Past Medical History:  Diagnosis Date   Arthritis    Bipolar affective ([place])    CHF (congestive heart failure) ([place])    Claustrophobia    Severe   Depression    Diabetes mellitus    uncontrolled    Heart murmur    Hypertension     Past Surgical History:  Procedure Laterality Date   BOWEL DECOMPRESSION N/A 01/19/2020   Procedure: BOWEL DECOMPRESSION;  Surgeon: Otis Brace, MD;  Location: [facility];  Service: Gastroenterology;  Laterality: N/A;   COLONOSCOPY WITH PROPOFOL N/A 01/19/2020   Procedure: COLONOSCOPY WITH PROPOFOL;  Surgeon: Otis Brace, MD;  Location: [place];  Service: Gastroenterology;  Laterality: N/A;   KNEE SURGERY     b/l knees; pending total knee    RADIOLOGY WITH ANESTHESIA N/A 07/04/2020   Procedure: MRI WITH ANESTHESIA BRAIN WITHOUT CONTRAST AND HEAD WITHOUT CONTRAST;  Surgeon: Radiologist, Medication, MD;  Location: [place];  Service: Radiology;  Laterality: N/A;    Prior to Admission medications   Medication Sig Start Date End Date Taking? Authorizing Provider  amLODipine (NORVASC) 10 MG tablet Take 1 tablet (10 mg total) by mouth daily. 07/05/20 10/03/20  [place] Donnamarie Poag, DO  amLODipine (NORVASC) 10 MG tablet Take 10 mg by mouth daily.    [provider]  aspirin 81 MG chewable tablet Chew by mouth daily.    [provider]  atorvastatin (LIPITOR) 80 MG tablet Take 1 tablet (80 mg  total) by mouth at bedtime. 07/05/20 10/03/20  [place] Donnamarie Poag, DO  atorvastatin (LIPITOR) 80 MG tablet Take 80 mg by mouth at bedtime.    [provider]  carvedilol (COREG) 12.5 MG tablet Take 1 tablet (12.5 mg total) by mouth 2 (two) times daily with a meal. 09/07/20 09/02/21  Antonieta Pert, MD  empagliflozin (JARDIANCE) 10 MG TABS tablet Take 1 tablet (10 mg total) by mouth daily. 09/23/20   [name], [name], DO  Ensure (ENSURE) Take 237 mLs by mouth 5 (five) times daily.    [provider]  ferrous sulfate 325 (65 FE) MG tablet Take 1 tablet (325 mg total) by mouth daily. 09/23/20   [name], Georgina Quint Latif, DO  insulin aspart (NOVOLOG) 100 UNIT/ML injection Inject 2-12 Units into the skin See admin instructions. Check and record blood sugar before meals and inject per ssi. 151-200 = 2 units  201-250 = 4 units  251-300 = 6 units  301-350 = 8 units  351-400 = 10 units  greater than 400 = 12 units and call MD. 09/07/20   Antonieta Pert, MD  insulin glargine (LANTUS) 100 UNIT/ML injection Inject 0.15 mLs (15 Units total) into the skin at bedtime. 09/22/20   [name], [name], DO  isosorbide-hydrALAZINE (BIDIL) 20-37.5 MG tablet Take 1 tablet by mouth 3 (three) times daily. 09/07/20   Antonieta Pert, MD  loperamide (IMODIUM) 2 MG capsule Take 1 capsule (2 mg total) by mouth as needed for diarrhea or loose stools. 09/22/20  [name], [name], DO  NON FORMULARY Take 237 mLs by mouth in the morning and at bedtime. ensure drink    [provider]  ondansetron (ZOFRAN ODT) 4 MG disintegrating tablet 4mg  ODT q4 hours prn nausea/vomit Patient taking differently: Take 4 mg by mouth every 4 (four) hours as needed for nausea or vomiting. 07/11/20   Drenda Freeze, MD  pantoprazole (PROTONIX) 40 MG tablet Take 40 mg by mouth daily.    [provider]  polyethylene glycol (MIRALAX / GLYCOLAX) 17 g packet Take 17 g by mouth daily as needed. Patient taking differently: Take 17 g by mouth  daily as needed for mild constipation. 03/30/20   [name], NP  potassium chloride (KLOR-CON) 10 MEQ tablet Take 20 mEq by mouth 2 (two) times daily.    [provider]  potassium chloride SA (KLOR-CON) 20 MEQ tablet Take 1 tablet (20 mEq total) by mouth 2 (two) times daily. Patient not taking: Reported on 10/24/2020 09/07/20   Antonieta Pert, MD  pregabalin (LYRICA) 75 MG capsule Take 1 capsule (75 mg total) by mouth 2 (two) times daily. 07/05/20 10/03/20  [place] Donnamarie Poag, DO  pregabalin (LYRICA) 75 MG capsule Take 75 mg by mouth 2 (two) times daily.    [provider]  saccharomyces boulardii (FLORASTOR) 250 MG capsule Take 1 capsule (250 mg total) by mouth 2 (two) times daily. 09/07/20   Antonieta Pert, MD  sacubitril-valsartan (ENTRESTO) 49-51 MG Take 1 tablet by mouth 2 (two) times daily.    [provider]  senna-docusate (SENOKOT-S) 8.6-50 MG tablet Take 1 tablet by mouth daily.    [provider]  sertraline (ZOLOFT) 50 MG tablet Take 50 mg by mouth daily.    [provider]  tamsulosin (FLOMAX) 0.4 MG CAPS capsule Take 0.8 mg by mouth daily.    [provider]  torsemide (DEMADEX) 20 MG tablet Take 40 mg by mouth 2 (two) times daily.    [provider]  torsemide 40 MG TABS Take 40 mg by mouth 2 (two) times daily. Adjust Lasix dose based upon his weight and renal function in a week at [facility] 09/14/20 10/14/20  Antonieta Pert, MD  Vitamin D, Ergocalciferol, (DRISDOL) 1.25 MG (50000 UNIT) CAPS capsule Take 50,000 Units by mouth every 7 (seven) days.    [provider]    Scheduled Meds:  [MAR Hold] insulin aspart  0-6 Units Subcutaneous TID WC   Continuous Infusions:  lactated ringers 20 mL/hr (11/19/20 1248)   PRN Meds:.[MAR Hold] labetalol, lactated ringers, [MAR Hold] ondansetron (ZOFRAN) IV  Allergies as of 11/18/2020 - Review Complete 11/17/2020  Allergen Reaction Noted   Other Anaphylaxis, Itching, and Swelling  08/28/2013    Family History  Problem Relation Age of Onset   Diabetes Other    Cancer Mother        uterus; deceased    Heart attack Mother 77   Hypertension Other    Heart attack Father 9   Stroke Sister        12/2012 age 77 y.o    Social History   Socioeconomic History   Marital status: Single    Spouse name: Not on file   Number of children: Not on file   Years of education: Not on file   Highest education level: Not on file  Occupational History   Not on file  Tobacco Use   Smoking status: Former   Smokeless tobacco: Never  Vaping Use  Vaping Use: Never used  Substance and Sexual Activity   Alcohol use: No   Drug use: Not Currently    Types: Cocaine    Comment: Last use 2019   Sexual activity: Yes    Birth control/protection: Condom  Other Topics Concern   Not on file  Social History Narrative   Moved from [place]   Denies smoking, drinking   +cocaine history (denies recent use)   Originally from [place]    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  Physical Exam: Vital signs: Vitals:   11/19/20 0636 11/19/20 1229  BP: (!) 171/94   Pulse: 77 98  Resp: 16 16  Temp: 98.3 F (36.8 C) 98 F (36.7 C)  SpO2: 99% 95%   Last BM Date: 11/18/20 General:   Lethargic, well-nourished, moderate acute distress  Head:  normocephalic, atraumatic Eyes: anicteric sclera ENT: oropharynx clear Neck: supple, nontender Lungs:  Clear throughout to auscultation.   No wheezes, crackles, or rhonchi. No acute distress. Heart:  Regular rate and rhythm; no murmurs, clicks, rubs,  or gallops. Abdomen: marked distention with diffuse tenderness with guarding, decreased bowel sounds  Rectal:  Deferred Ext: chronic vascular changes  GI:  Lab Results: Recent Labs    11/17/20 1808 11/18/20 1503 11/18/20 1627  WBC 4.5 4.1  --   HGB 10.6* 11.0* 11.9*  HCT 34.0* 35.8* 35.0*  PLT 188 208  --    BMET Recent Labs    11/17/20 1808 11/18/20 1503 11/18/20 1627  NA 143 141 145  K 3.0* 3.1* 3.2*  CL 110 106 107  CO2 24 24  --   GLUCOSE 159* 211* 214*  BUN 40* 42* 38*  CREATININE 1.25* 1.48* 1.60*  CALCIUM 9.5 9.3  --    LFT Recent Labs    11/18/20 1503  PROT 8.1  ALBUMIN 3.7  AST 15  ALT 14  ALKPHOS 77  BILITOT 1.0   PT/INR No results for input(s): LABPROT, INR in the last 72 hours.   Studies/Results: DG Chest 1 View  Result Date: 11/17/2020 CLINICAL DATA:  Shortness of breath.  Abdominal pain and distension. EXAM: CHEST  1 VIEW COMPARISON:  Chest radiograph head CT 10/24/2020. FINDINGS: Patient is rotated. Stable cardiomegaly. Stable aortic tortuosity and atherosclerosis. No acute airspace disease, pleural effusion, or pneumothorax. Gaseous distention of stomach in the left upper quadrant is similar to prior exam. No acute osseous abnormalities are seen. IMPRESSION: No acute findings. Stable low lung volumes with cardiomegaly and aortic tortuosity. Electronically Signed   By: Keith Rake M.D.   On: 11/17/2020 19:20   CT ABDOMEN PELVIS W CONTRAST  Result Date: 11/17/2020 CLINICAL DATA:  Distension pain EXAM: CT ABDOMEN AND PELVIS WITH  CONTRAST TECHNIQUE: Multidetector CT imaging of the abdomen and pelvis was performed using the standard protocol following bolus administration of intravenous  contrast. CONTRAST:  57mL OMNIPAQUE IOHEXOL 350 MG/ML SOLN COMPARISON:  CT 08/30/2020, radiograph 08/30/2020, CT 08/18/2020, CT 01/05/2020 FINDINGS: Lower chest: Lung bases demonstrate no acute consolidation or effusion. Small pericardial effusion measuring up to 18 mm in thickness. Borderline to mild cardiomegaly. Gynecomastia. Hepatobiliary: Gallstone. No biliary dilatation or focal hepatic abnormality. Pancreas: Unremarkable. No pancreatic ductal dilatation or surrounding inflammatory changes. Spleen: Normal in size without focal abnormality. Adrenals/Urinary Tract: Adrenal glands are unremarkable. Kidneys are normal, without renal calculi, focal lesion, or hydronephrosis. Bladder is unremarkable. Stomach/Bowel: The stomach is nonenlarged. No dilated small bowel. Considerable diffuse air distension of the colon without acute wall thickening. No definite mesenteric twisting to suggest volvulus. Moderate stool in the rectum Vascular/Lymphatic: Mild aortic atherosclerosis. No aneurysm. No suspicious nodes Reproductive: Prostate is unremarkable. Other: Negative for free air or effusion Musculoskeletal: No acute or suspicious osseous abnormality. IMPRESSION: 1. Considerable diffuse air distension of the colon but without evidence for volvulus, bowel wall thickening, or obstruction. 2. Gallstones Electronically Signed   By: Donavan Foil M.D.   On: 11/17/2020 20:28   DG Abd 2 Views  Result Date: 11/19/2020 CLINICAL DATA:  Follow-up rectosigmoid distention. EXAM: ABDOMEN - 2 VIEW COMPARISON:  11/18/2020 FINDINGS: Persistent gaseous distension of the colon. This appears unchanged from previous exam. No significant small bowel dilatation identified. IMPRESSION: No change in gaseous distension of the colon Electronically Signed   By: Kerby Moors M.D.   On: 11/19/2020 06:21   DG Abd Acute W/Chest  Result Date: 11/18/2020 CLINICAL DATA:  Ileus EXAM: DG ABDOMEN ACUTE WITH 1 VIEW CHEST COMPARISON:  CT 11/17/2020,  08/30/2020 FINDINGS: Single-view chest demonstrates low lung volumes. No focal opacity or pleural effusion. Stable cardiomegaly. No pneumothorax. Supine and upright views of the abdomen demonstrate no free air beneath the diaphragm. No significant change in moderate to marked diffuse air distension of the colon. Rectosigmoid colon distension up to 12.4 cm. Excreted contrast in the urinary bladder. IMPRESSION: 1. Low lung volumes.  Cardiomegaly. 2. No significant change in moderate to marked diffuse air distension of the colon with colon distension up to 12.4 cm. Electronically Signed   By: Donavan Foil M.D.   On: 11/18/2020 16:27    Impression/Plan: Severe abdominal distention and CT concerning for pseudo-obstruction. Fecal impaction also possible but ongoing distention following defecation yesterday. Will attempt colonic decompression today. NPO. Supportive care.    LOS: 0 days   [name]  11/19/2020, 1:07 PM  Questions please call [phone]

## 2020-11-19 NOTE — Progress Notes (Signed)
PROGRESS NOTE    [name]  QZR:[id number] DOB: 10/08/1960 DOA: 11/18/2020 PCP: [name], NP   Brief Narrative:  Patient noted to have profound [name] is a 60 y.o. male with medical history significant for HFpEF, hypertension, CVA, type 2 diabetes with neuropathy, CKD stage IIIa, polysubstance abuse, chronic pain, depression and hyperlipidemia who presents concerns of worsening abdominal pain.  Found to have significant gastric distention in the colon on imaging, general surgery and GI following.  Of note patient has had bowel decompression in 2021 with Dr. Alessandra Bevels.  Assessment & Plan:   Ogilvie syndrome/colonic pseudo-obstruction -Keep n.p.o. -GI planning for decompression in the next 24 hours per their schedule -General surgery following, appreciate insight recommendations -Holding NG tube per patient request, not currently having any nausea or vomiting just abdominal pain which is moderately well controlled on current regimen -Repeat KUB this morning shows no significant change  Hypokalemia -Repleted, follow repeat labs   AKI on CKD stage IIIa -Creatinine of 1.60 from a baseline of around 1.2 in the setting of poor PO intake -Continue IV fluids given n.p.o. status- monitor closely given HF history   Hypertension - Elevated with episodes of pain, appears to be well controlled when pain is well controlled -Continue IV labetalol as needed  HFpEF, not in acute exacerbation -Last echocardiogram 07/2018 with EF of 65 to 70% and grade 2 diastolic dysfunction with small pericardial effusion.  Mild aortic valve stenosis. -Resume Entresto, Coreg when able to tolerate p.o. given above   Type 2 diabetes - Placed on sensitive sliding scale while n.p.o.   History of CAD - Continue aspirin, statin and Coreg when able to tolerate p.o.   Depression - Continue sertraline when able tolerate p.o.    BPH - Continue tamsulosin  DVT prophylaxis: SCDs Code Status:  Full Family Communication: None present  Status is: Inpatient  Dispo: The patient is from: Home              Anticipated d/c is to: Home              Anticipated d/c date is: 48 to 72 hours              Patient currently not medically stable for discharge  Consultants:  GI, general surgery  Procedures:  Tentative plan for decompression with GI  Antimicrobials:  None  Subjective: No acute issues or events overnight, pain currently well controlled denies headache fever chills chest pain shortness of breath, nausea well controlled no vomiting diarrhea or constipation  Objective: Vitals:   11/18/20 2342 11/19/20 0213 11/19/20 0332 11/19/20 0636  BP: (!) 157/96 (!) 193/99 (!) 167/92 (!) 171/94  Pulse: 80 82 79 77  Resp:  14 19 16   Temp:  97.8 F (36.6 C)  98.3 F (36.8 C)  TempSrc:  Oral  Oral  SpO2:  100%  99%    Intake/Output Summary (Last 24 hours) at 11/19/2020 0747 Last data filed at 11/19/2020 0600 Gross per 24 hour  Intake 817.27 ml  Output 1087 ml  Net -269.73 ml   There were no vitals filed for this visit.  Examination:  General:  Pleasantly resting in bed, No acute distress. HEENT:  Normocephalic atraumatic.  Sclerae nonicteric, noninjected.  Extraocular movements intact bilaterally. Neck:  Without mass or deformity.  Trachea is midline. Lungs:  Clear to auscultate bilaterally without rhonchi, wheeze, or rales. Heart:  Regular rate and rhythm.  Without murmurs, rubs, or gallops. Abdomen: Distended, tympanic,  exquisitely tender to palpation Extremities: Without cyanosis, clubbing, edema, or obvious deformity. Vascular:  Dorsalis pedis and posterior tibial pulses palpable bilaterally. Skin:  Warm and dry, no erythema, no ulcerations.  Data Reviewed: I have personally reviewed following labs and imaging studies  CBC: Recent Labs  Lab 11/17/20 1808 11/18/20 1503 11/18/20 1627  WBC 4.5 4.1  --   NEUTROABS  --  2.7  --   HGB 10.6* 11.0* 11.9*  HCT 34.0*  35.8* 35.0*  MCV 78.9* 80.1  --   PLT 188 208  --    Basic Metabolic Panel: Recent Labs  Lab 11/17/20 1808 11/18/20 1503 11/18/20 1627  NA 143 141 145  K 3.0* 3.1* 3.2*  CL 110 106 107  CO2 24 24  --   GLUCOSE 159* 211* 214*  BUN 40* 42* 38*  CREATININE 1.25* 1.48* 1.60*  CALCIUM 9.5 9.3  --   MG  --  2.3  --    GFR: CrCl cannot be calculated (Unknown ideal weight.). Liver Function Tests: Recent Labs  Lab 11/17/20 1808 11/18/20 1503  AST 15 15  ALT 13 14  ALKPHOS 73 77  BILITOT 1.1 1.0  PROT 7.8 8.1  ALBUMIN 3.8 3.7   Recent Labs  Lab 11/17/20 1808 11/18/20 1503  LIPASE 20 21   No results for input(s): AMMONIA in the last 168 hours. Coagulation Profile: No results for input(s): INR, PROTIME in the last 168 hours. Cardiac Enzymes: No results for input(s): CKTOTAL, CKMB, CKMBINDEX, TROPONINI in the last 168 hours. BNP (last 3 results) No results for input(s): PROBNP in the last 8760 hours. HbA1C: No results for input(s): HGBA1C in the last 72 hours. CBG: No results for input(s): GLUCAP in the last 168 hours. Lipid Profile: No results for input(s): CHOL, HDL, LDLCALC, TRIG, CHOLHDL, LDLDIRECT in the last 72 hours. Thyroid Function Tests: Recent Labs    11/19/20 0310  TSH 1.024   Anemia Panel: No results for input(s): VITAMINB12, FOLATE, FERRITIN, TIBC, IRON, RETICCTPCT in the last 72 hours. Sepsis Labs: Recent Labs  Lab 11/18/20 1503  LATICACIDVEN 1.0    Recent Results (from the past 240 hour(s))  Resp Panel by RT-PCR (Flu A&B, Covid) Nasopharyngeal Swab     Status: None   Collection Time: 11/17/20  6:15 PM   Specimen: Nasopharyngeal Swab; Nasopharyngeal(NP) swabs in vial transport medium  Result Value Ref Range Status   SARS Coronavirus 2 by RT PCR NEGATIVE NEGATIVE Final    Comment: (NOTE) SARS-CoV-2 target nucleic acids are NOT DETECTED.  The SARS-CoV-2 RNA is generally detectable in upper respiratory specimens during the acute phase of  infection. The lowest concentration of SARS-CoV-2 viral copies this assay can detect is 138 copies/mL. A negative result does not preclude SARS-Cov-2 infection and should not be used as the sole basis for treatment or other patient management decisions. A negative result may occur with  improper specimen collection/handling, submission of specimen other than nasopharyngeal swab, presence of viral mutation(s) within the areas targeted by this assay, and inadequate number of viral copies(<138 copies/mL). A negative result must be combined with clinical observations, patient history, and epidemiological information. The expected result is Negative.  Fact Sheet for Patients:  EntrepreneurPulse.com.au  Fact Sheet for Healthcare Providers:  IncredibleEmployment.be  This test is no t yet approved or cleared by the [place] FDA and  has been authorized for detection and/or diagnosis of SARS-CoV-2 by FDA under an Emergency Use Authorization (EUA). This EUA will remain  in effect (meaning  this test can be used) for the duration of the COVID-19 declaration under Section 564(b)(1) of the Act, 21 U.S.C.section 360bbb-3(b)(1), unless the authorization is terminated  or revoked sooner.       Influenza A by PCR NEGATIVE NEGATIVE Final   Influenza B by PCR NEGATIVE NEGATIVE Final    Comment: (NOTE) The Xpert Xpress SARS-CoV-2/FLU/RSV plus assay is intended as an aid in the diagnosis of influenza from Nasopharyngeal swab specimens and should not be used as a sole basis for treatment. Nasal washings and aspirates are unacceptable for Xpert Xpress SARS-CoV-2/FLU/RSV testing.  Fact Sheet for Patients: EntrepreneurPulse.com.au  Fact Sheet for Healthcare Providers: IncredibleEmployment.be  This test is not yet approved or cleared by the [place] FDA and has been authorized for detection and/or diagnosis of SARS-CoV-2  by FDA under an Emergency Use Authorization (EUA). This EUA will remain in effect (meaning this test can be used) for the duration of the COVID-19 declaration under Section 564(b)(1) of the Act, 21 U.S.C. section 360bbb-3(b)(1), unless the authorization is terminated or revoked.  Performed at [facility], [place] [address] [place], [place] [address]   Resp Panel by RT-PCR (Flu A&B, Covid) Nasopharyngeal Swab     Status: None   Collection Time: 11/18/20  8:23 PM   Specimen: Nasopharyngeal Swab; Nasopharyngeal(NP) swabs in vial transport medium  Result Value Ref Range Status   SARS Coronavirus 2 by RT PCR NEGATIVE NEGATIVE Final    Comment: (NOTE) SARS-CoV-2 target nucleic acids are NOT DETECTED.  The SARS-CoV-2 RNA is generally detectable in upper respiratory specimens during the acute phase of infection. The lowest concentration of SARS-CoV-2 viral copies this assay can detect is 138 copies/mL. A negative result does not preclude SARS-Cov-2 infection and should not be used as the sole basis for treatment or other patient management decisions. A negative result may occur with  improper specimen collection/handling, submission of specimen other than nasopharyngeal swab, presence of viral mutation(s) within the areas targeted by this assay, and inadequate number of viral copies(<138 copies/mL). A negative result must be combined with clinical observations, patient history, and epidemiological information. The expected result is Negative.  Fact Sheet for Patients:  EntrepreneurPulse.com.au  Fact Sheet for Healthcare Providers:  IncredibleEmployment.be  This test is no t yet approved or cleared by the [place] FDA and  has been authorized for detection and/or diagnosis of SARS-CoV-2 by FDA under an Emergency Use Authorization (EUA). This EUA will remain  in effect (meaning this test can be used) for the duration of  the COVID-19 declaration under Section 564(b)(1) of the Act, 21 U.S.C.section 360bbb-3(b)(1), unless the authorization is terminated  or revoked sooner.       Influenza A by PCR NEGATIVE NEGATIVE Final   Influenza B by PCR NEGATIVE NEGATIVE Final    Comment: (NOTE) The Xpert Xpress SARS-CoV-2/FLU/RSV plus assay is intended as an aid in the diagnosis of influenza from Nasopharyngeal swab specimens and should not be used as a sole basis for treatment. Nasal washings and aspirates are unacceptable for Xpert Xpress SARS-CoV-2/FLU/RSV testing.  Fact Sheet for Patients: EntrepreneurPulse.com.au  Fact Sheet for Healthcare Providers: IncredibleEmployment.be  This test is not yet approved or cleared by the [place] FDA and has been authorized for detection and/or diagnosis of SARS-CoV-2 by FDA under an Emergency Use Authorization (EUA). This EUA will remain in effect (meaning this test can be used) for the duration of the COVID-19 declaration under Section 564(b)(1) of the Act, 21 U.S.C. section 360bbb-3(b)(1),  unless the authorization is terminated or revoked.  Performed at [facility], [place] [address], [place], [place] [address]          Radiology Studies: DG Chest 1 View  Result Date: 11/17/2020 CLINICAL DATA:  Shortness of breath.  Abdominal pain and distension. EXAM: CHEST  1 VIEW COMPARISON:  Chest radiograph head CT 10/24/2020. FINDINGS: Patient is rotated. Stable cardiomegaly. Stable aortic tortuosity and atherosclerosis. No acute airspace disease, pleural effusion, or pneumothorax. Gaseous distention of stomach in the left upper quadrant is similar to prior exam. No acute osseous abnormalities are seen. IMPRESSION: No acute findings. Stable low lung volumes with cardiomegaly and aortic tortuosity. Electronically Signed   By: Keith Rake M.D.   On: 11/17/2020 19:20   CT ABDOMEN PELVIS W CONTRAST  Result Date:  11/17/2020 CLINICAL DATA:  Distension pain EXAM: CT ABDOMEN AND PELVIS WITH CONTRAST TECHNIQUE: Multidetector CT imaging of the abdomen and pelvis was performed using the standard protocol following bolus administration of intravenous contrast. CONTRAST:  4mL OMNIPAQUE IOHEXOL 350 MG/ML SOLN COMPARISON:  CT 08/30/2020, radiograph 08/30/2020, CT 08/18/2020, CT 01/05/2020 FINDINGS: Lower chest: Lung bases demonstrate no acute consolidation or effusion. Small pericardial effusion measuring up to 18 mm in thickness. Borderline to mild cardiomegaly. Gynecomastia. Hepatobiliary: Gallstone. No biliary dilatation or focal hepatic abnormality. Pancreas: Unremarkable. No pancreatic ductal dilatation or surrounding inflammatory changes. Spleen: Normal in size without focal abnormality. Adrenals/Urinary Tract: Adrenal glands are unremarkable. Kidneys are normal, without renal calculi, focal lesion, or hydronephrosis. Bladder is unremarkable. Stomach/Bowel: The stomach is nonenlarged. No dilated small bowel. Considerable diffuse air distension of the colon without acute wall thickening. No definite mesenteric twisting to suggest volvulus. Moderate stool in the rectum Vascular/Lymphatic: Mild aortic atherosclerosis. No aneurysm. No suspicious nodes Reproductive: Prostate is unremarkable. Other: Negative for free air or effusion Musculoskeletal: No acute or suspicious osseous abnormality. IMPRESSION: 1. Considerable diffuse air distension of the colon but without evidence for volvulus, bowel wall thickening, or obstruction. 2. Gallstones Electronically Signed   By: Donavan Foil M.D.   On: 11/17/2020 20:28   DG Abd 2 Views  Result Date: 11/19/2020 CLINICAL DATA:  Follow-up rectosigmoid distention. EXAM: ABDOMEN - 2 VIEW COMPARISON:  11/18/2020 FINDINGS: Persistent gaseous distension of the colon. This appears unchanged from previous exam. No significant small bowel dilatation identified. IMPRESSION: No change in gaseous  distension of the colon Electronically Signed   By: Kerby Moors M.D.   On: 11/19/2020 06:21   DG Abd Acute W/Chest  Result Date: 11/18/2020 CLINICAL DATA:  Ileus EXAM: DG ABDOMEN ACUTE WITH 1 VIEW CHEST COMPARISON:  CT 11/17/2020, 08/30/2020 FINDINGS: Single-view chest demonstrates low lung volumes. No focal opacity or pleural effusion. Stable cardiomegaly. No pneumothorax. Supine and upright views of the abdomen demonstrate no free air beneath the diaphragm. No significant change in moderate to marked diffuse air distension of the colon. Rectosigmoid colon distension up to 12.4 cm. Excreted contrast in the urinary bladder. IMPRESSION: 1. Low lung volumes.  Cardiomegaly. 2. No significant change in moderate to marked diffuse air distension of the colon with colon distension up to 12.4 cm. Electronically Signed   By: Donavan Foil M.D.   On: 11/18/2020 16:27    Scheduled Meds: Continuous Infusions:  lactated ringers 75 mL/hr at 11/18/20 2320     LOS: 0 days   Time spent: 76min  Bilan Tedesco C Kiona Blume, DO [facility]  If 7PM-7AM, please contact night-coverage [email or web address]  11/19/2020, 7:47 AM

## 2020-11-19 NOTE — Transfer of Care (Signed)
Immediate Anesthesia Transfer of Care Note  Patient: [name]  Procedure(s) Performed: COLONOSCOPY WITH PROPOFOL BOWEL DECOMPRESSION  Patient Location: PACU  Anesthesia Type:General  Level of Consciousness: awake, alert  and patient cooperative  Airway & Oxygen Therapy: Patient Spontanous Breathing and Patient connected to face mask oxygen  Post-op Assessment: Report given to RN, Post -op Vital signs reviewed and stable and Patient moving all extremities X 4  Post vital signs: stable  Last Vitals:  Vitals Value Taken Time  BP 171/94 11/19/20 1423  Temp    Pulse 74 11/19/20 1423  Resp 14 11/19/20 1423  SpO2 100 % 11/19/20 1423    Last Pain:  Vitals:   11/19/20 1229  TempSrc: Oral  PainSc: 0-No pain      Patients Stated Pain Goal: 0 (47/99/87 2158)  Complications: No notable events documented.

## 2020-11-19 NOTE — Progress Notes (Signed)
Subjective: Abdominal XR this morning shows stable colonic distension. Patient endorses continued pain. Had a BM overnight.   Objective: Vital signs in last 24 hours: Temp:  [97.7 F (36.5 C)-99.3 F (37.4 C)] 98.3 F (36.8 C) (09/25 0636) Pulse Rate:  [76-87] 77 (09/25 0636) Resp:  [12-19] 16 (09/25 0636) BP: (126-200)/(75-114) 171/94 (09/25 0636) SpO2:  [94 %-100 %] 99 % (09/25 0636) Last BM Date: 11/18/20  Intake/Output from previous day: 09/24 0701 - 09/25 0700 In: 817.3 [I.V.:366.8; IV Piggyback:450.5] Out: 1087 [Urine:1087] Intake/Output this shift: No intake/output data recorded.  PE: General: resting in bed, NAD Neuro: alert and oriented, no focal deficits Resp: normal work of breathing Abdomen: markedly distended, tympanic, tender to palpation Extremities: warm and well-perfused   Lab Results:  Recent Labs    11/17/20 1808 11/18/20 1503 11/18/20 1627  WBC 4.5 4.1  --   HGB 10.6* 11.0* 11.9*  HCT 34.0* 35.8* 35.0*  PLT 188 208  --    BMET Recent Labs    11/17/20 1808 11/18/20 1503 11/18/20 1627  NA 143 141 145  K 3.0* 3.1* 3.2*  CL 110 106 107  CO2 24 24  --   GLUCOSE 159* 211* 214*  BUN 40* 42* 38*  CREATININE 1.25* 1.48* 1.60*  CALCIUM 9.5 9.3  --    PT/INR No results for input(s): LABPROT, INR in the last 72 hours. CMP     Component Value Date/Time   NA 145 11/18/2020 1627   NA 135 (L) 12/31/2013 1336   K 3.2 (L) 11/18/2020 1627   K 3.8 12/31/2013 1336   CL 107 11/18/2020 1627   CL 101 12/31/2013 1336   CO2 24 11/18/2020 1503   CO2 25 12/31/2013 1336   GLUCOSE 214 (H) 11/18/2020 1627   GLUCOSE 249 (H) 12/31/2013 1336   BUN 38 (H) 11/18/2020 1627   BUN 8 12/31/2013 1336   CREATININE 1.60 (H) 11/18/2020 1627   CREATININE 0.88 12/31/2013 1336   CALCIUM 9.3 11/18/2020 1503   CALCIUM 8.6 12/31/2013 1336   PROT 8.1 11/18/2020 1503   PROT 7.8 12/31/2013 1336   ALBUMIN 3.7 11/18/2020 1503   ALBUMIN 3.4 12/31/2013 1336    AST 15 11/18/2020 1503   AST 23 12/31/2013 1336   ALT 14 11/18/2020 1503   ALT 20 12/31/2013 1336   ALKPHOS 77 11/18/2020 1503   ALKPHOS 76 12/31/2013 1336   BILITOT 1.0 11/18/2020 1503   BILITOT 0.6 12/31/2013 1336   GFRNONAA 54 (L) 11/18/2020 1503   GFRNONAA >60 12/31/2013 1336   GFRAA >60 01/29/2018 1705   GFRAA >60 12/31/2013 1336   Lipase     Component Value Date/Time   LIPASE 21 11/18/2020 1503       Studies/Results: DG Chest 1 View  Result Date: 11/17/2020 CLINICAL DATA:  Shortness of breath.  Abdominal pain and distension. EXAM: CHEST  1 VIEW COMPARISON:  Chest radiograph head CT 10/24/2020. FINDINGS: Patient is rotated. Stable cardiomegaly. Stable aortic tortuosity and atherosclerosis. No acute airspace disease, pleural effusion, or pneumothorax. Gaseous distention of stomach in the left upper quadrant is similar to prior exam. No acute osseous abnormalities are seen. IMPRESSION: No acute findings. Stable low lung volumes with cardiomegaly and aortic tortuosity. Electronically Signed   By: Keith Rake M.D.   On: 11/17/2020 19:20   CT ABDOMEN PELVIS W CONTRAST  Result Date: 11/17/2020 CLINICAL DATA:  Distension pain EXAM: CT ABDOMEN AND PELVIS WITH CONTRAST TECHNIQUE: Multidetector CT imaging of  the abdomen and pelvis was performed using the standard protocol following bolus administration of intravenous contrast. CONTRAST:  6mL OMNIPAQUE IOHEXOL 350 MG/ML SOLN COMPARISON:  CT 08/30/2020, radiograph 08/30/2020, CT 08/18/2020, CT 01/05/2020 FINDINGS: Lower chest: Lung bases demonstrate no acute consolidation or effusion. Small pericardial effusion measuring up to 18 mm in thickness. Borderline to mild cardiomegaly. Gynecomastia. Hepatobiliary: Gallstone. No biliary dilatation or focal hepatic abnormality. Pancreas: Unremarkable. No pancreatic ductal dilatation or surrounding inflammatory changes. Spleen: Normal in size without focal abnormality. Adrenals/Urinary Tract:  Adrenal glands are unremarkable. Kidneys are normal, without renal calculi, focal lesion, or hydronephrosis. Bladder is unremarkable. Stomach/Bowel: The stomach is nonenlarged. No dilated small bowel. Considerable diffuse air distension of the colon without acute wall thickening. No definite mesenteric twisting to suggest volvulus. Moderate stool in the rectum Vascular/Lymphatic: Mild aortic atherosclerosis. No aneurysm. No suspicious nodes Reproductive: Prostate is unremarkable. Other: Negative for free air or effusion Musculoskeletal: No acute or suspicious osseous abnormality. IMPRESSION: 1. Considerable diffuse air distension of the colon but without evidence for volvulus, bowel wall thickening, or obstruction. 2. Gallstones Electronically Signed   By: Donavan Foil M.D.   On: 11/17/2020 20:28   DG Abd 2 Views  Result Date: 11/19/2020 CLINICAL DATA:  Follow-up rectosigmoid distention. EXAM: ABDOMEN - 2 VIEW COMPARISON:  11/18/2020 FINDINGS: Persistent gaseous distension of the colon. This appears unchanged from previous exam. No significant small bowel dilatation identified. IMPRESSION: No change in gaseous distension of the colon Electronically Signed   By: Kerby Moors M.D.   On: 11/19/2020 06:21   DG Abd Acute W/Chest  Result Date: 11/18/2020 CLINICAL DATA:  Ileus EXAM: DG ABDOMEN ACUTE WITH 1 VIEW CHEST COMPARISON:  CT 11/17/2020, 08/30/2020 FINDINGS: Single-view chest demonstrates low lung volumes. No focal opacity or pleural effusion. Stable cardiomegaly. No pneumothorax. Supine and upright views of the abdomen demonstrate no free air beneath the diaphragm. No significant change in moderate to marked diffuse air distension of the colon. Rectosigmoid colon distension up to 12.4 cm. Excreted contrast in the urinary bladder. IMPRESSION: 1. Low lung volumes.  Cardiomegaly. 2. No significant change in moderate to marked diffuse air distension of the colon with colon distension up to 12.4 cm.  Electronically Signed   By: Donavan Foil M.D.   On: 11/18/2020 16:27    Anti-infectives: Anti-infectives (From admission, onward)    None        Assessment/Plan 60 yo male with multiple medical comorbidities presenting with colonic distension consistent with pseudo-obstruction. His pain and distension have not improved after having a bowel movement. KUB reviewed and shows ongoing colonic dilation, no free air or pneumatosis. Currently no indication for surgical intervention, however the patient needs GI evaluation for colonoscopic decompression. (Consulted last night by EDP and planning to see patient this morning).    LOS: 0 days    Michaelle Birks, MD [facility] Surgery General, Hepatobiliary and Pancreatic Surgery 11/19/20 9:23 AM

## 2020-11-19 NOTE — Op Note (Addendum)
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 Estimated Blood Loss:     Estimated blood loss: none. Procedure:                Pre-Anesthesia Assessment:                           - Prior to the procedure, a History and Physical                            was performed, and patient medications and                            allergies were reviewed. The patient's tolerance of                            previous anesthesia was also reviewed. The risks                            and benefits of the procedure and the sedation                            options and risks were discussed with the patient.                            All questions were answered, and informed consent                            was obtained. Prior Anticoagulants: The patient has                            taken no previous anticoagulant or antiplatelet  agents. ASA Grade Assessment: III - A patient with                            severe systemic disease. After reviewing the risks                            and benefits, the patient was deemed in                            satisfactory condition to undergo the procedure.                           After obtaining informed consent, the colonoscope                             was passed under direct vision. Throughout the                            procedure, the patient's blood pressure, pulse, and                            oxygen saturations were monitored continuously. The                            PCF-HQ190L ([id number]) Olympus colonoscope was                            introduced through the anus and advanced to the the                            splenic flexure. The colonoscopy was performed with                            difficulty due to poor endoscopic visualization                            with solid stool. The patient tolerated the                            procedure well. The quality of the bowel                            preparation was an unprepped procedure. Scope In: 1:47:07 PM Scope Out: 2:05:34 PM Total Procedure Duration: 0 hours 18 minutes 27 seconds  Findings:      The perianal and digital rectal examinations were normal.      Copious quantities of semi-solid solid stool was found in the entire       colon, precluding visualization. Fluid aspiration was performed. Lavage       of the area was performed, resulting in incomplete clearance with       continued poor visualization.      Procedure terminated at proximal descending colon/splenic flexure due to       large amount of solid stool obscuring visualization of the lumen.  Colonic walls significantly dilated in sigmoid colon and successfully       decompressed left side of colon. Abdomen significantly less distended at       the conclusion of the procedure. Impression:               - Stool in the entire examined colon. Fluid                            aspiration performed.                           - Successful colonic decompression (left colon). Moderate Sedation:      Not Applicable - Patient had care per Anesthesia. Recommendation:           - NPO.                           - Continue present medications. Procedure Code(s):        --- Professional ---                            [phone], 53, Colonoscopy, flexible; diagnostic,                            including collection of specimen(s) by brushing or                            washing, when performed (separate procedure) Diagnosis Code(s):        --- Professional ---                           R19.4, Change in bowel habit                           R10.84, Generalized abdominal pain                           R93.3, Abnormal findings on diagnostic imaging of                            other parts of digestive tract CPT copyright 2019 [employer]. All rights reserved. The codes documented in this report are preliminary and upon coder review may  be revised to meet current compliance requirements. [name], MD 11/19/2020 2:30:45 PM This report has been signed electronically. Number of Addenda: 0

## 2020-11-19 NOTE — Anesthesia Postprocedure Evaluation (Signed)
Anesthesia Post Note  Patient: [name]  Procedure(s) Performed: COLONOSCOPY WITH PROPOFOL BOWEL DECOMPRESSION     Patient location during evaluation: PACU Anesthesia Type: General Level of consciousness: awake and alert Pain management: pain level controlled Vital Signs Assessment: post-procedure vital signs reviewed and stable Respiratory status: spontaneous breathing, nonlabored ventilation, respiratory function stable and patient connected to nasal cannula oxygen Cardiovascular status: blood pressure returned to baseline and stable Postop Assessment: no apparent nausea or vomiting Anesthetic complications: no   No notable events documented.  Last Vitals:  Vitals:   11/19/20 1445 11/19/20 1500  BP: (!) 168/92 (!) 169/93  Pulse: 72 73  Resp: 14 16  Temp:  36.7 C  SpO2: 95% 94%    Last Pain:  Vitals:   11/19/20 1535  TempSrc:   PainSc: 5                  Rasmus Preusser S

## 2020-11-20 ENCOUNTER — Encounter (HOSPITAL_COMMUNITY): Payer: Self-pay | Admitting: Gastroenterology

## 2020-11-20 DIAGNOSIS — N179 Acute kidney failure, unspecified: Secondary | ICD-10-CM | POA: Diagnosis not present

## 2020-11-20 DIAGNOSIS — E1165 Type 2 diabetes mellitus with hyperglycemia: Secondary | ICD-10-CM | POA: Diagnosis not present

## 2020-11-20 DIAGNOSIS — I10 Essential (primary) hypertension: Secondary | ICD-10-CM | POA: Diagnosis not present

## 2020-11-20 DIAGNOSIS — K5981 Ogilvie syndrome: Secondary | ICD-10-CM | POA: Diagnosis not present

## 2020-11-20 LAB — BASIC METABOLIC PANEL
Anion gap: 8 (ref 5–15)
BUN: 42 mg/dL — ABNORMAL HIGH (ref 6–20)
CO2: 27 mmol/L (ref 22–32)
Calcium: 9.4 mg/dL (ref 8.9–10.3)
Chloride: 111 mmol/L (ref 98–111)
Creatinine, Ser: 2.11 mg/dL — ABNORMAL HIGH (ref 0.61–1.24)
GFR, Estimated: 35 mL/min — ABNORMAL LOW (ref >60)
Glucose, Bld: 183 mg/dL — ABNORMAL HIGH (ref 70–99)
Potassium: 3.3 mmol/L — ABNORMAL LOW (ref 3.5–5.1)
Sodium: 146 mmol/L — ABNORMAL HIGH (ref 135–145)

## 2020-11-20 LAB — CBC
HCT: 33.7 % — ABNORMAL LOW (ref 39.0–52.0)
Hemoglobin: 10.4 g/dL — ABNORMAL LOW (ref 13.0–17.0)
MCH: 24.8 pg — ABNORMAL LOW (ref 26.0–34.0)
MCHC: 30.9 g/dL (ref 30.0–36.0)
MCV: 80.2 fL (ref 80.0–100.0)
Platelets: 192 10*3/uL (ref 150–400)
RBC: 4.2 MIL/uL — ABNORMAL LOW (ref 4.22–5.81)
RDW: 17 % — ABNORMAL HIGH (ref 11.5–15.5)
WBC: 4.1 10*3/uL (ref 4.0–10.5)
nRBC: 0 % (ref 0.0–0.2)

## 2020-11-20 LAB — GLUCOSE, CAPILLARY
Glucose-Capillary: 167 mg/dL — ABNORMAL HIGH (ref 70–99)
Glucose-Capillary: 142 mg/dL — ABNORMAL HIGH (ref 70–99)
Glucose-Capillary: 147 mg/dL — ABNORMAL HIGH (ref 70–99)
Glucose-Capillary: 144 mg/dL — ABNORMAL HIGH (ref 70–99)

## 2020-11-20 MED ORDER — BISACODYL 10 MG RE SUPP: 10.0000 mg | Freq: Every day | RECTAL | Status: AC | PRN

## 2020-11-20 MED ORDER — LACTATED RINGERS IV SOLN: INTRAVENOUS | Status: DC

## 2020-11-20 MED ORDER — POTASSIUM CHLORIDE 10 MEQ/100ML IV SOLN
10.0000 meq | INTRAVENOUS | Status: AC
Start: 2020-11-20 — End: 2020-11-20
  Administered 2020-11-20 (×3): 10 meq via INTRAVENOUS
  Filled 2020-11-20 (×4): qty 100

## 2020-11-20 MED ORDER — BISACODYL 10 MG RE SUPP
10.0000 mg | Freq: Every day | RECTAL | Status: AC
  Administered 2020-11-20 – 2020-11-23 (×4): 10 mg via RECTAL
  Filled 2020-11-20 (×4): qty 1

## 2020-11-20 NOTE — Progress Notes (Signed)
PROGRESS NOTE    [name]  ZSW:[id number] DOB: [birth year] DOA: 11/18/2020 PCP: [name], NP   Brief Narrative:  Patient noted to have profound [name] is a 60 y.o. male with medical history significant for HFpEF, hypertension, CVA, type 2 diabetes with neuropathy, CKD stage IIIa, polysubstance abuse, chronic pain, depression and hyperlipidemia who presents concerns of worsening abdominal pain.  Found to have significant gastric distention in the colon on imaging, general surgery and GI following.  Of note patient has had bowel decompression in 2021 with Dr. Alessandra Bevels.  Assessment & Plan:   Ogilvie syndrome/colonic pseudo-obstruction, resolving -Diet to advance per GI; NPO currently -No BM/flatus and notes pain is improved but still fairly high -GI successful with decompression 11/19/20 -General surgery following, appreciate insight recommendations -Holding NG tube per patient request, not currently having any nausea or vomiting just abdominal pain which is moderately well controlled on current regimen  Hypokalemia, mild -Repleted, follow repeat labs   AKI on CKD stage IIIa, worsening -Creatinine up to 2 from a baseline of around 1.2 in the setting of poor PO intake/NPO status - follow closely given HF baseline and risk for volume overload but will continue low IVF 50cc/hr for now   Hypertension - Elevated with episodes of pain, appears to be well controlled when pain is well controlled - Continue IV labetalol as needed for HTN only after pain is evaluated/treated first  HFpEF, not in acute exacerbation -Last echocardiogram 07/2018 with EF of 65 to 70% and grade 2 diastolic dysfunction with small pericardial effusion.  Mild aortic valve stenosis. -Resume Entresto, Coreg when able to tolerate p.o. given above   Type 2 diabetes - Placed on sensitive sliding scale while n.p.o.   History of CAD - Continue aspirin, statin and Coreg when able to tolerate p.o.    Depression - Continue sertraline when able tolerate p.o.    BPH - Continue tamsulosin  DVT prophylaxis: SCDs Code Status: Full Family Communication: None present  Status is: Inpatient  Dispo: The patient is from: Home              Anticipated d/c is to: Home              Anticipated d/c date is: 48 to 72 hours              Patient currently not medically stable for discharge  Consultants:  GI, general surgery  Procedures:  Decompression with GI 11/19/20  Antimicrobials:  None  Subjective: No acute issues or events overnight, pain moderately well controlled denies headache fever chills chest pain shortness of breath, nausea well controlled no vomiting diarrhea  Objective: Vitals:   11/19/20 1626 11/19/20 2051 11/19/20 2150 11/20/20 0214  BP:  (!) 193/104 (!) 173/94 (!) 169/93  Pulse:  77 76 77  Resp:  12 16 15   Temp:  98.1 F (36.7 C)  98.1 F (36.7 C)  TempSrc:  Oral  Oral  SpO2:  97%  98%  Weight: 127.8 kg     Height: 5\' 8"  (1.727 m)       Intake/Output Summary (Last 24 hours) at 11/20/2020 0729 Last data filed at 11/20/2020 0530 Gross per 24 hour  Intake 1436.67 ml  Output 1000 ml  Net 436.67 ml    Filed Weights   11/19/20 1626  Weight: 127.8 kg    Examination:  General:  Pleasantly resting in bed, No acute distress. HEENT:  Normocephalic atraumatic.  Sclerae nonicteric, noninjected.  Extraocular  movements intact bilaterally. Neck:  Without mass or deformity.  Trachea is midline. Lungs:  Clear to auscultate bilaterally without rhonchi, wheeze, or rales. Heart:  Regular rate and rhythm.  Without murmurs, rubs, or gallops. Abdomen: Distended, moderately tender diffusely to palpation Extremities: Without cyanosis, clubbing, edema, or obvious deformity. Vascular:  Dorsalis pedis and posterior tibial pulses palpable bilaterally. Skin:  Warm and dry, no erythema, no ulcerations.  Data Reviewed: I have personally reviewed following labs and imaging  studies  CBC: Recent Labs  Lab 11/17/20 1808 11/18/20 1503 11/18/20 1627 11/20/20 0318  WBC 4.5 4.1  --  4.1  NEUTROABS  --  2.7  --   --   HGB 10.6* 11.0* 11.9* 10.4*  HCT 34.0* 35.8* 35.0* 33.7*  MCV 78.9* 80.1  --  80.2  PLT 188 208  --  580    Basic Metabolic Panel: Recent Labs  Lab 11/17/20 1808 11/18/20 1503 11/18/20 1627 11/20/20 0318  NA 143 141 145 146*  K 3.0* 3.1* 3.2* 3.3*  CL 110 106 107 111  CO2 24 24  --  27  GLUCOSE 159* 211* 214* 183*  BUN 40* 42* 38* 42*  CREATININE 1.25* 1.48* 1.60* 2.11*  CALCIUM 9.5 9.3  --  9.4  MG  --  2.3  --   --     GFR: Estimated Creatinine Clearance: 49.2 mL/min (A) (by C-G formula based on SCr of 2.11 mg/dL (H)). Liver Function Tests: Recent Labs  Lab 11/17/20 1808 11/18/20 1503  AST 15 15  ALT 13 14  ALKPHOS 73 77  BILITOT 1.1 1.0  PROT 7.8 8.1  ALBUMIN 3.8 3.7    Recent Labs  Lab 11/17/20 1808 11/18/20 1503  LIPASE 20 21    No results for input(s): AMMONIA in the last 168 hours. Coagulation Profile: No results for input(s): INR, PROTIME in the last 168 hours. Cardiac Enzymes: No results for input(s): CKTOTAL, CKMB, CKMBINDEX, TROPONINI in the last 168 hours. BNP (last 3 results) No results for input(s): PROBNP in the last 8760 hours. HbA1C: No results for input(s): HGBA1C in the last 72 hours. CBG: Recent Labs  Lab 11/19/20 0828 11/19/20 1142 11/19/20 1733 11/19/20 2046  GLUCAP 194* 167* 148* 143*   Lipid Profile: No results for input(s): CHOL, HDL, LDLCALC, TRIG, CHOLHDL, LDLDIRECT in the last 72 hours. Thyroid Function Tests: Recent Labs    11/19/20 0310  TSH 1.024    Anemia Panel: No results for input(s): VITAMINB12, FOLATE, FERRITIN, TIBC, IRON, RETICCTPCT in the last 72 hours. Sepsis Labs: Recent Labs  Lab 11/18/20 1503  LATICACIDVEN 1.0     Recent Results (from the past 240 hour(s))  Resp Panel by RT-PCR (Flu A&B, Covid) Nasopharyngeal Swab     Status: None    Collection Time: 11/17/20  6:15 PM   Specimen: Nasopharyngeal Swab; Nasopharyngeal(NP) swabs in vial transport medium  Result Value Ref Range Status   SARS Coronavirus 2 by RT PCR NEGATIVE NEGATIVE Final    Comment: (NOTE) SARS-CoV-2 target nucleic acids are NOT DETECTED.  The SARS-CoV-2 RNA is generally detectable in upper respiratory specimens during the acute phase of infection. The lowest concentration of SARS-CoV-2 viral copies this assay can detect is 138 copies/mL. A negative result does not preclude SARS-Cov-2 infection and should not be used as the sole basis for treatment or other patient management decisions. A negative result may occur with  improper specimen collection/handling, submission of specimen other than nasopharyngeal swab, presence of viral mutation(s) within the areas  targeted by this assay, and inadequate number of viral copies(<138 copies/mL). A negative result must be combined with clinical observations, patient history, and epidemiological information. The expected result is Negative.  Fact Sheet for Patients:  EntrepreneurPulse.com.au  Fact Sheet for Healthcare Providers:  IncredibleEmployment.be  This test is no t yet approved or cleared by the [place] FDA and  has been authorized for detection and/or diagnosis of SARS-CoV-2 by FDA under an Emergency Use Authorization (EUA). This EUA will remain  in effect (meaning this test can be used) for the duration of the COVID-19 declaration under Section 564(b)(1) of the Act, 21 U.S.C.section 360bbb-3(b)(1), unless the authorization is terminated  or revoked sooner.       Influenza A by PCR NEGATIVE NEGATIVE Final   Influenza B by PCR NEGATIVE NEGATIVE Final    Comment: (NOTE) The Xpert Xpress SARS-CoV-2/FLU/RSV plus assay is intended as an aid in the diagnosis of influenza from Nasopharyngeal swab specimens and should not be used as a sole basis for treatment.  Nasal washings and aspirates are unacceptable for Xpert Xpress SARS-CoV-2/FLU/RSV testing.  Fact Sheet for Patients: EntrepreneurPulse.com.au  Fact Sheet for Healthcare Providers: IncredibleEmployment.be  This test is not yet approved or cleared by the [place] FDA and has been authorized for detection and/or diagnosis of SARS-CoV-2 by FDA under an Emergency Use Authorization (EUA). This EUA will remain in effect (meaning this test can be used) for the duration of the COVID-19 declaration under Section 564(b)(1) of the Act, 21 U.S.C. section 360bbb-3(b)(1), unless the authorization is terminated or revoked.  Performed at [facility], [place] [address], [place], [place] [address]   Resp Panel by RT-PCR (Flu A&B, Covid) Nasopharyngeal Swab     Status: None   Collection Time: 11/18/20  8:23 PM   Specimen: Nasopharyngeal Swab; Nasopharyngeal(NP) swabs in vial transport medium  Result Value Ref Range Status   SARS Coronavirus 2 by RT PCR NEGATIVE NEGATIVE Final    Comment: (NOTE) SARS-CoV-2 target nucleic acids are NOT DETECTED.  The SARS-CoV-2 RNA is generally detectable in upper respiratory specimens during the acute phase of infection. The lowest concentration of SARS-CoV-2 viral copies this assay can detect is 138 copies/mL. A negative result does not preclude SARS-Cov-2 infection and should not be used as the sole basis for treatment or other patient management decisions. A negative result may occur with  improper specimen collection/handling, submission of specimen other than nasopharyngeal swab, presence of viral mutation(s) within the areas targeted by this assay, and inadequate number of viral copies(<138 copies/mL). A negative result must be combined with clinical observations, patient history, and epidemiological information. The expected result is Negative.  Fact Sheet for Patients:   EntrepreneurPulse.com.au  Fact Sheet for Healthcare Providers:  IncredibleEmployment.be  This test is no t yet approved or cleared by the [place] FDA and  has been authorized for detection and/or diagnosis of SARS-CoV-2 by FDA under an Emergency Use Authorization (EUA). This EUA will remain  in effect (meaning this test can be used) for the duration of the COVID-19 declaration under Section 564(b)(1) of the Act, 21 U.S.C.section 360bbb-3(b)(1), unless the authorization is terminated  or revoked sooner.       Influenza A by PCR NEGATIVE NEGATIVE Final   Influenza B by PCR NEGATIVE NEGATIVE Final    Comment: (NOTE) The Xpert Xpress SARS-CoV-2/FLU/RSV plus assay is intended as an aid in the diagnosis of influenza from Nasopharyngeal swab specimens and should not be used as a sole basis  for treatment. Nasal washings and aspirates are unacceptable for Xpert Xpress SARS-CoV-2/FLU/RSV testing.  Fact Sheet for Patients: EntrepreneurPulse.com.au  Fact Sheet for Healthcare Providers: IncredibleEmployment.be  This test is not yet approved or cleared by the [place] FDA and has been authorized for detection and/or diagnosis of SARS-CoV-2 by FDA under an Emergency Use Authorization (EUA). This EUA will remain in effect (meaning this test can be used) for the duration of the COVID-19 declaration under Section 564(b)(1) of the Act, 21 U.S.C. section 360bbb-3(b)(1), unless the authorization is terminated or revoked.  Performed at [facility], [place] [address] [place], [place] [address]      Radiology Studies: DG Abd 2 Views  Result Date: 11/19/2020 CLINICAL DATA:  Follow-up rectosigmoid distention. EXAM: ABDOMEN - 2 VIEW COMPARISON:  11/18/2020 FINDINGS: Persistent gaseous distension of the colon. This appears unchanged from previous exam. No significant small bowel dilatation  identified. IMPRESSION: No change in gaseous distension of the colon Electronically Signed   By: Kerby Moors M.D.   On: 11/19/2020 06:21   DG Abd Acute W/Chest  Result Date: 11/18/2020 CLINICAL DATA:  Ileus EXAM: DG ABDOMEN ACUTE WITH 1 VIEW CHEST COMPARISON:  CT 11/17/2020, 08/30/2020 FINDINGS: Single-view chest demonstrates low lung volumes. No focal opacity or pleural effusion. Stable cardiomegaly. No pneumothorax. Supine and upright views of the abdomen demonstrate no free air beneath the diaphragm. No significant change in moderate to marked diffuse air distension of the colon. Rectosigmoid colon distension up to 12.4 cm. Excreted contrast in the urinary bladder. IMPRESSION: 1. Low lung volumes.  Cardiomegaly. 2. No significant change in moderate to marked diffuse air distension of the colon with colon distension up to 12.4 cm. Electronically Signed   By: Donavan Foil M.D.   On: 11/18/2020 16:27    Scheduled Meds:  Chlorhexidine Gluconate Cloth  6 each Topical Daily   insulin aspart  0-6 Units Subcutaneous TID WC   Continuous Infusions:  sodium chloride       LOS: 1 day   Time spent: 27min  Riddhi Grether C Jereline Ticer, DO [facility]  If 7PM-7AM, please contact night-coverage [email or web address]  11/20/2020, 7:29 AM

## 2020-11-20 NOTE — Progress Notes (Signed)
1 Day Post-Op  Subjective: Patient is a poor historian and difficult to really tell how he is doing.  When asked if his pain is better he states " I don't know.  Pain is pain."  Mild nausea, but unclear how long this has been present.  Overall seems to think his abdomen is better than yesterday as best as I can tell.  Unclear whether he is passing flatus or if he has had a BM since the c-scope.  None documented.  ROS: See above, otherwise other systems negative  Objective: Vital signs in last 24 hours: Temp:  [98 F (36.7 C)-98.1 F (36.7 C)] 98.1 F (36.7 C) (09/26 0214) Pulse Rate:  [72-98] 77 (09/26 0214) Resp:  [12-16] 15 (09/26 0214) BP: (168-193)/(92-104) 169/93 (09/26 0214) SpO2:  [94 %-100 %] 98 % (09/26 0214) Weight:  [127.8 kg] 127.8 kg (09/25 1626) Last BM Date: 11/19/20  Intake/Output from previous day: 09/25 0701 - 09/26 0700 In: 1436.7 [I.V.:1436.7] Out: 1000 [Urine:1000] Intake/Output this shift: No intake/output data recorded.  PE: Abd: seems soft, but still a bit distended, mildly tender but no guarding or peritonitis, some BS present.  Lab Results:  Recent Labs    11/18/20 1503 11/18/20 1627 11/20/20 0318  WBC 4.1  --  4.1  HGB 11.0* 11.9* 10.4*  HCT 35.8* 35.0* 33.7*  PLT 208  --  192   BMET Recent Labs    11/18/20 1503 11/18/20 1627 11/20/20 0318  NA 141 145 146*  K 3.1* 3.2* 3.3*  CL 106 107 111  CO2 24  --  27  GLUCOSE 211* 214* 183*  BUN 42* 38* 42*  CREATININE 1.48* 1.60* 2.11*  CALCIUM 9.3  --  9.4   PT/INR No results for input(s): LABPROT, INR in the last 72 hours. CMP     Component Value Date/Time   NA 146 (H) 11/20/2020 0318   NA 135 (L) 12/31/2013 1336   K 3.3 (L) 11/20/2020 0318   K 3.8 12/31/2013 1336   CL 111 11/20/2020 0318   CL 101 12/31/2013 1336   CO2 27 11/20/2020 0318   CO2 25 12/31/2013 1336   GLUCOSE 183 (H) 11/20/2020 0318   GLUCOSE 249 (H) 12/31/2013 1336   BUN 42 (H) 11/20/2020 0318   BUN 8  12/31/2013 1336   CREATININE 2.11 (H) 11/20/2020 0318   CREATININE 0.88 12/31/2013 1336   CALCIUM 9.4 11/20/2020 0318   CALCIUM 8.6 12/31/2013 1336   PROT 8.1 11/18/2020 1503   PROT 7.8 12/31/2013 1336   ALBUMIN 3.7 11/18/2020 1503   ALBUMIN 3.4 12/31/2013 1336   AST 15 11/18/2020 1503   AST 23 12/31/2013 1336   ALT 14 11/18/2020 1503   ALT 20 12/31/2013 1336   ALKPHOS 77 11/18/2020 1503   ALKPHOS 76 12/31/2013 1336   BILITOT 1.0 11/18/2020 1503   BILITOT 0.6 12/31/2013 1336   GFRNONAA 35 (L) 11/20/2020 0318   GFRNONAA >60 12/31/2013 1336   GFRAA >60 01/29/2018 1705   GFRAA >60 12/31/2013 1336   Lipase     Component Value Date/Time   LIPASE 21 11/18/2020 1503       Studies/Results: DG Abd 2 Views  Result Date: 11/19/2020 CLINICAL DATA:  Follow-up rectosigmoid distention. EXAM: ABDOMEN - 2 VIEW COMPARISON:  11/18/2020 FINDINGS: Persistent gaseous distension of the colon. This appears unchanged from previous exam. No significant small bowel dilatation identified. IMPRESSION: No change in gaseous distension of the colon Electronically Signed   By: Lovena Le  Clovis Riley M.D.   On: 11/19/2020 06:21   DG Abd Acute W/Chest  Result Date: 11/18/2020 CLINICAL DATA:  Ileus EXAM: DG ABDOMEN ACUTE WITH 1 VIEW CHEST COMPARISON:  CT 11/17/2020, 08/30/2020 FINDINGS: Single-view chest demonstrates low lung volumes. No focal opacity or pleural effusion. Stable cardiomegaly. No pneumothorax. Supine and upright views of the abdomen demonstrate no free air beneath the diaphragm. No significant change in moderate to marked diffuse air distension of the colon. Rectosigmoid colon distension up to 12.4 cm. Excreted contrast in the urinary bladder. IMPRESSION: 1. Low lung volumes.  Cardiomegaly. 2. No significant change in moderate to marked diffuse air distension of the colon with colon distension up to 12.4 cm. Electronically Signed   By: Donavan Foil M.D.   On: 11/18/2020 16:27     Anti-infectives: Anti-infectives (From admission, onward)    None        Assessment/Plan Colonic Ogilvie's  -s/p c-scope decompression yesterday.  Seems to feel a little better today. -no evidence of bowel ischemia, compromise, or perforation.  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 Surgery 11/20/2020, 11:41 AM Please see Amion for pager number during day hours 7:00am-4:30pm or 7:00am -11:30am on weekends

## 2020-11-20 NOTE — Progress Notes (Signed)
Pt experienced small emesis occurrence despite being NPO. PRN Zofran administered

## 2020-11-20 NOTE — Progress Notes (Signed)
[facility] Gastroenterology Progress Note  [name] 60 y.o. 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 States he still has abdominal pain and distention that is no better, but no worse than yesterday or the day before. Has not had a BM, still NPO. Endorses some nausea, no vomiting.  ROS : Review of Systems  Cardiovascular:  Negative for chest pain and palpitations.  Gastrointestinal:  Positive for abdominal pain and nausea. Negative for blood in stool, constipation, diarrhea, heartburn, melena and vomiting.     Objective: Vital signs in last 24 hours: Vitals:   11/19/20 2150 11/20/20 0214  BP: (!) 173/94 (!) 169/93  Pulse: 76 77  Resp: 16 15  Temp:  98.1 F (36.7 C)  SpO2:  98%    Physical Exam:  General:  Alert, cooperative, no distress, appears stated age  Head:  Normocephalic, without obvious abnormality, atraumatic  Eyes:  Anicteric sclera, EOM's intact  Lungs:   Clear to auscultation bilaterally, respirations unlabored  Heart:  Regular rate and rhythm, S1, S2 normal  Abdomen:   marked distention with diffuse tenderness with guarding, decreased bowel sounds     Lab Results: Recent Labs    11/18/20 1503 11/18/20 1627 11/20/20 0318  NA 141 145 146*  K 3.1* 3.2* 3.3*  CL 106 107 111  CO2 24  --  27  GLUCOSE 211* 214* 183*  BUN 42* 38* 42*  CREATININE 1.48* 1.60* 2.11*  CALCIUM 9.3  --  9.4  MG 2.3  --   --    Recent Labs    11/17/20 1808 11/18/20 1503  AST 15 15  ALT 13 14  ALKPHOS 73 77  BILITOT 1.1 1.0  PROT 7.8 8.1  ALBUMIN 3.8 3.7   Recent Labs    11/18/20 1503 11/18/20 1627 11/20/20 0318  WBC 4.1  --  4.1  NEUTROABS 2.7  --   --   HGB 11.0* 11.9* 10.4*  HCT 35.8* 35.0* 33.7*  MCV 80.1  --  80.2  PLT 208  --  192   No results for input(s): LABPROT, INR in the last 72 hours.    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 Fluid aspiration performed. Colonic walls significantly dilated in sigmoid colon and successfully decompressed left side of colon. Abdomen significantly less distended at the conclusion of the procedure - Abdominal xray 9/25: Persistent gaseous distension of the colon. This appears unchanged from previous exam. - Potassium 3.3, Sodium 146  Anemia -HGB 10.4 (decreased from 11.9 9/24)  AKI on CKD stage IIIa  Type 2 diabetes  HFpEF, not in acute exacerbation  Plan:  Continue to maintain magnesium above 2 and potassium at 4-4.5.   Ordered dulcolax suppository daily.  Not considering procedure at this time, will manage conservatively.  [facility] will follow  Garnette Scheuermann PA-C 11/20/2020, 9:41 AM  Contact #  [phone]

## 2020-11-21 ENCOUNTER — Inpatient Hospital Stay (HOSPITAL_COMMUNITY): Payer: Medicare HMO

## 2020-11-21 DIAGNOSIS — I10 Essential (primary) hypertension: Secondary | ICD-10-CM | POA: Diagnosis not present

## 2020-11-21 DIAGNOSIS — R1084 Generalized abdominal pain: Secondary | ICD-10-CM

## 2020-11-21 DIAGNOSIS — E876 Hypokalemia: Secondary | ICD-10-CM | POA: Diagnosis not present

## 2020-11-21 DIAGNOSIS — I5189 Other ill-defined heart diseases: Secondary | ICD-10-CM | POA: Diagnosis not present

## 2020-11-21 LAB — BASIC METABOLIC PANEL
Anion gap: 12 (ref 5–15)
BUN: 41 mg/dL — ABNORMAL HIGH (ref 6–20)
CO2: 25 mmol/L (ref 22–32)
Calcium: 9.3 mg/dL (ref 8.9–10.3)
Chloride: 112 mmol/L — ABNORMAL HIGH (ref 98–111)
Creatinine, Ser: 1.58 mg/dL — ABNORMAL HIGH (ref 0.61–1.24)
GFR, Estimated: 50 mL/min — ABNORMAL LOW (ref >60)
Glucose, Bld: 185 mg/dL — ABNORMAL HIGH (ref 70–99)
Potassium: 3.2 mmol/L — ABNORMAL LOW (ref 3.5–5.1)
Sodium: 149 mmol/L — ABNORMAL HIGH (ref 135–145)

## 2020-11-21 LAB — GLUCOSE, CAPILLARY
Glucose-Capillary: 119 mg/dL — ABNORMAL HIGH (ref 70–99)
Glucose-Capillary: 134 mg/dL — ABNORMAL HIGH (ref 70–99)
Glucose-Capillary: 139 mg/dL — ABNORMAL HIGH (ref 70–99)
Glucose-Capillary: 157 mg/dL — ABNORMAL HIGH (ref 70–99)
Glucose-Capillary: 159 mg/dL — ABNORMAL HIGH (ref 70–99)

## 2020-11-21 LAB — CBC
HCT: 34.8 % — ABNORMAL LOW (ref 39.0–52.0)
Hemoglobin: 10.8 g/dL — ABNORMAL LOW (ref 13.0–17.0)
MCH: 24.8 pg — ABNORMAL LOW (ref 26.0–34.0)
MCHC: 31 g/dL (ref 30.0–36.0)
MCV: 79.8 fL — ABNORMAL LOW (ref 80.0–100.0)
Platelets: 183 10*3/uL (ref 150–400)
RBC: 4.36 MIL/uL (ref 4.22–5.81)
RDW: 17.2 % — ABNORMAL HIGH (ref 11.5–15.5)
WBC: 4.6 10*3/uL (ref 4.0–10.5)
nRBC: 0 % (ref 0.0–0.2)

## 2020-11-21 LAB — MAGNESIUM: Magnesium: 2.2 mg/dL (ref 1.7–2.4)

## 2020-11-21 MED ORDER — CHLORHEXIDINE GLUCONATE 0.12 % MT SOLN
15.0000 mL | Freq: Two times a day (BID) | OROMUCOSAL | Status: DC
  Administered 2020-11-21 – 2020-12-10 (×34): 15 mL via OROMUCOSAL
  Filled 2020-11-21 (×34): qty 15

## 2020-11-21 MED ORDER — POTASSIUM CHLORIDE 10 MEQ/100ML IV SOLN
10.0000 meq | INTRAVENOUS | Status: AC
  Administered 2020-11-21 (×6): 10 meq via INTRAVENOUS
  Filled 2020-11-21 (×3): qty 100

## 2020-11-21 MED ORDER — PHENOL 1.4 % MT LIQD
1.0000 | OROMUCOSAL | Status: DC | PRN
  Administered 2020-11-21 – 2020-12-11 (×7): 1 via OROMUCOSAL
  Filled 2020-11-21 (×2): qty 177

## 2020-11-21 MED ORDER — ORAL CARE MOUTH RINSE
15.0000 mL | Freq: Two times a day (BID) | OROMUCOSAL | Status: DC
  Administered 2020-11-21 – 2020-12-11 (×21): 15 mL via OROMUCOSAL

## 2020-11-21 MED ORDER — LIP MEDEX EX OINT
TOPICAL_OINTMENT | CUTANEOUS | Status: DC | PRN
  Administered 2020-11-21: 75 via TOPICAL
  Filled 2020-11-21: qty 7

## 2020-11-21 MED ORDER — HYDRALAZINE HCL 20 MG/ML IJ SOLN
5.0000 mg | Freq: Four times a day (QID) | INTRAMUSCULAR | Status: DC | PRN
  Administered 2020-11-21 – 2020-11-22 (×3): 5 mg via INTRAVENOUS
  Filled 2020-11-21 (×2): qty 1

## 2020-11-21 NOTE — Progress Notes (Signed)
#  [place] drain to R nare confirmed in place per xray. Continue to drain green colored gastric contents. Patient reports feeling better.

## 2020-11-21 NOTE — Progress Notes (Addendum)
Patient with nausea for second time tonight, zofran given. Spitting out thick clear phlegm/ Abd soft, distended, and non-tender. Unsure if he is passing gas, no BM. Despite order for CLD, patient advised to refrain from consuming po intake.Will continue to monitor. Notified provider on call requested order for NGT. #16Fr blake drain placed without difficulty to R nare @ 50cm. 22ml of green gastric contents returned. Placed on LIS. Patient tolerated procedure well. Xray ordered and completed to confirm placement.

## 2020-11-21 NOTE — Progress Notes (Signed)
PROGRESS NOTE    [name]  YHC:[id number] DOB: 1960/07/16 DOA: 11/18/2020 PCP: [name], NP   Brief Narrative:  Patient noted to have profound HAYK DIVIS is a 60 y.o. male with medical history significant for HFpEF, hypertension, CVA, type 2 diabetes with neuropathy, CKD stage IIIa, polysubstance abuse, chronic pain, depression and hyperlipidemia who presents concerns of worsening abdominal pain.  Found to have significant gastric distention in the colon on imaging, general surgery and GI following.  Of note patient has had bowel decompression in 2021 with Dr. Alessandra Bevels.  Assessment & Plan:  Ogilvie syndrome/colonic pseudo-obstruction, stable, potentially worsening - NG placed overnight with ongoing bilious drainage, minimal symptom improvement per patient - Liquid bowel movement noted by staff this morning - Decompression 11/19/20 with GI, minimal improvement - General surgery no longer following given limited surgical options  Hypokalemia, ongoing in the setting of poor p.o. intake - Repleted, follow repeat labs  AKI on CKD stage IIIa, improving - Creatinine improving with IV fluids, baseline of around 1.2  - Follow closely given HF baseline and risk for volume overload but will continue low IVF 50cc/hr for now   Hypertension - Elevated with episodes of pain, appears to be well controlled when pain is well controlled - Continue IV labetalol as needed for HTN only after pain is evaluated/treated first  HFpEF, not in acute exacerbation -Last echocardiogram 07/2018 with EF of 65 to 70% and grade 2 diastolic dysfunction with small pericardial effusion.  Mild aortic valve stenosis. -Resume Entresto, Coreg when able to tolerate p.o. given above   Type 2 diabetes - Placed on sensitive sliding scale while n.p.o.   History of CAD - Continue aspirin, statin and Coreg when able to tolerate p.o.   Depression - Continue sertraline when able tolerate p.o.    BPH -  Continue tamsulosin  DVT prophylaxis: SCDs Code Status: Full Family Communication: None present  Status is: Inpatient  Dispo: The patient is from: Long-term care facility              Anticipated d/c is to: Same              Anticipated d/c date is: >72 hours              Patient currently not medically stable for discharge  Consultants:  GI, general surgery  Procedures:  Decompression with GI 11/19/20  Antimicrobials:  None  Subjective: NG tube inserted overnight given worsening distention nausea and vomiting.  Currently denies chest pain shortness of breath headache fevers chills, notable one episode of diarrhea this morning.  Objective: Vitals:   11/20/20 2030 11/21/20 0039 11/21/20 0134 11/21/20 0700  BP: (!) 192/104 (!) 203/110 (!) 199/108 (!) 206/110  Pulse: 80 84 80 79  Resp: 18   20  Temp: 98 F (36.7 C)   97.9 F (36.6 C)  TempSrc:    Oral  SpO2: 95%   95%  Weight:      Height:        Intake/Output Summary (Last 24 hours) at 11/21/2020 0742 Last data filed at 11/21/2020 0600 Gross per 24 hour  Intake 956.17 ml  Output 1200 ml  Net -243.83 ml    Filed Weights   11/19/20 1626  Weight: 127.8 kg    Examination:  General:  Pleasantly resting in bed, No acute distress. HEENT:  Normocephalic atraumatic.  Sclerae nonicteric, noninjected.  Extraocular movements intact bilaterally. Neck:  Without mass or deformity.  Trachea is midline.  Lungs:  Clear to auscultate bilaterally without rhonchi, wheeze, or rales. Heart:  Regular rate and rhythm.  Without murmurs, rubs, or gallops. Abdomen: Distended, moderately tender diffusely to palpation Extremities: Without cyanosis, clubbing, edema, or obvious deformity. Vascular:  Dorsalis pedis and posterior tibial pulses palpable bilaterally. Skin:  Warm and dry, no erythema, no ulcerations.  Data Reviewed: I have personally reviewed following labs and imaging studies  CBC: Recent Labs  Lab 11/17/20 1808  11/18/20 1503 11/18/20 1627 11/20/20 0318 11/21/20 0617  WBC 4.5 4.1  --  4.1 4.6  NEUTROABS  --  2.7  --   --   --   HGB 10.6* 11.0* 11.9* 10.4* 10.8*  HCT 34.0* 35.8* 35.0* 33.7* 34.8*  MCV 78.9* 80.1  --  80.2 79.8*  PLT 188 208  --  192 149    Basic Metabolic Panel: Recent Labs  Lab 11/17/20 1808 11/18/20 1503 11/18/20 1627 11/20/20 0318 11/21/20 0617  NA 143 141 145 146* 149*  K 3.0* 3.1* 3.2* 3.3* 3.2*  CL 110 106 107 111 112*  CO2 24 24  --  27 25  GLUCOSE 159* 211* 214* 183* 185*  BUN 40* 42* 38* 42* 41*  CREATININE 1.25* 1.48* 1.60* 2.11* 1.58*  CALCIUM 9.5 9.3  --  9.4 9.3  MG  --  2.3  --   --   --     GFR: Estimated Creatinine Clearance: 65.6 mL/min (A) (by C-G formula based on SCr of 1.58 mg/dL (H)). Liver Function Tests: Recent Labs  Lab 11/17/20 1808 11/18/20 1503  AST 15 15  ALT 13 14  ALKPHOS 73 77  BILITOT 1.1 1.0  PROT 7.8 8.1  ALBUMIN 3.8 3.7    Recent Labs  Lab 11/17/20 1808 11/18/20 1503  LIPASE 20 21    No results for input(s): AMMONIA in the last 168 hours. Coagulation Profile: No results for input(s): INR, PROTIME in the last 168 hours. Cardiac Enzymes: No results for input(s): CKTOTAL, CKMB, CKMBINDEX, TROPONINI in the last 168 hours. BNP (last 3 results) No results for input(s): PROBNP in the last 8760 hours. HbA1C: No results for input(s): HGBA1C in the last 72 hours. CBG: Recent Labs  Lab 11/20/20 0820 11/20/20 1149 11/20/20 1743 11/20/20 2025 11/21/20 0005  GLUCAP 167* 142* 147* 144* 159*    Lipid Profile: No results for input(s): CHOL, HDL, LDLCALC, TRIG, CHOLHDL, LDLDIRECT in the last 72 hours. Thyroid Function Tests: Recent Labs    11/19/20 0310  TSH 1.024    Anemia Panel: No results for input(s): VITAMINB12, FOLATE, FERRITIN, TIBC, IRON, RETICCTPCT in the last 72 hours. Sepsis Labs: Recent Labs  Lab 11/18/20 1503  LATICACIDVEN 1.0     Recent Results (from the past 240 hour(s))  Resp Panel  by RT-PCR (Flu A&B, Covid) Nasopharyngeal Swab     Status: None   Collection Time: 11/17/20  6:15 PM   Specimen: Nasopharyngeal Swab; Nasopharyngeal(NP) swabs in vial transport medium  Result Value Ref Range Status   SARS Coronavirus 2 by RT PCR NEGATIVE NEGATIVE Final    Comment: (NOTE) SARS-CoV-2 target nucleic acids are NOT DETECTED.  The SARS-CoV-2 RNA is generally detectable in upper respiratory specimens during the acute phase of infection. The lowest concentration of SARS-CoV-2 viral copies this assay can detect is 138 copies/mL. A negative result does not preclude SARS-Cov-2 infection and should not be used as the sole basis for treatment or other patient management decisions. A negative result may occur with  improper specimen collection/handling,  submission of specimen other than nasopharyngeal swab, presence of viral mutation(s) within the areas targeted by this assay, and inadequate number of viral copies(<138 copies/mL). A negative result must be combined with clinical observations, patient history, and epidemiological information. The expected result is Negative.  Fact Sheet for Patients:  EntrepreneurPulse.com.au  Fact Sheet for Healthcare Providers:  IncredibleEmployment.be  This test is no t yet approved or cleared by the [place] FDA and  has been authorized for detection and/or diagnosis of SARS-CoV-2 by FDA under an Emergency Use Authorization (EUA). This EUA will remain  in effect (meaning this test can be used) for the duration of the COVID-19 declaration under Section 564(b)(1) of the Act, 21 U.S.C.section 360bbb-3(b)(1), unless the authorization is terminated  or revoked sooner.       Influenza A by PCR NEGATIVE NEGATIVE Final   Influenza B by PCR NEGATIVE NEGATIVE Final    Comment: (NOTE) The Xpert Xpress SARS-CoV-2/FLU/RSV plus assay is intended as an aid in the diagnosis of influenza from Nasopharyngeal swab  specimens and should not be used as a sole basis for treatment. Nasal washings and aspirates are unacceptable for Xpert Xpress SARS-CoV-2/FLU/RSV testing.  Fact Sheet for Patients: EntrepreneurPulse.com.au  Fact Sheet for Healthcare Providers: IncredibleEmployment.be  This test is not yet approved or cleared by the [place] FDA and has been authorized for detection and/or diagnosis of SARS-CoV-2 by FDA under an Emergency Use Authorization (EUA). This EUA will remain in effect (meaning this test can be used) for the duration of the COVID-19 declaration under Section 564(b)(1) of the Act, 21 U.S.C. section 360bbb-3(b)(1), unless the authorization is terminated or revoked.  Performed at [facility], [place] [address] [place], [place] [address]   Resp Panel by RT-PCR (Flu A&B, Covid) Nasopharyngeal Swab     Status: None   Collection Time: 11/18/20  8:23 PM   Specimen: Nasopharyngeal Swab; Nasopharyngeal(NP) swabs in vial transport medium  Result Value Ref Range Status   SARS Coronavirus 2 by RT PCR NEGATIVE NEGATIVE Final    Comment: (NOTE) SARS-CoV-2 target nucleic acids are NOT DETECTED.  The SARS-CoV-2 RNA is generally detectable in upper respiratory specimens during the acute phase of infection. The lowest concentration of SARS-CoV-2 viral copies this assay can detect is 138 copies/mL. A negative result does not preclude SARS-Cov-2 infection and should not be used as the sole basis for treatment or other patient management decisions. A negative result may occur with  improper specimen collection/handling, submission of specimen other than nasopharyngeal swab, presence of viral mutation(s) within the areas targeted by this assay, and inadequate number of viral copies(<138 copies/mL). A negative result must be combined with clinical observations, patient history, and epidemiological information. The expected result is  Negative.  Fact Sheet for Patients:  EntrepreneurPulse.com.au  Fact Sheet for Healthcare Providers:  IncredibleEmployment.be  This test is no t yet approved or cleared by the [place] FDA and  has been authorized for detection and/or diagnosis of SARS-CoV-2 by FDA under an Emergency Use Authorization (EUA). This EUA will remain  in effect (meaning this test can be used) for the duration of the COVID-19 declaration under Section 564(b)(1) of the Act, 21 U.S.C.section 360bbb-3(b)(1), unless the authorization is terminated  or revoked sooner.       Influenza A by PCR NEGATIVE NEGATIVE Final   Influenza B by PCR NEGATIVE NEGATIVE Final    Comment: (NOTE) The Xpert Xpress SARS-CoV-2/FLU/RSV plus assay is intended as an aid in the diagnosis of  influenza from Nasopharyngeal swab specimens and should not be used as a sole basis for treatment. Nasal washings and aspirates are unacceptable for Xpert Xpress SARS-CoV-2/FLU/RSV testing.  Fact Sheet for Patients: EntrepreneurPulse.com.au  Fact Sheet for Healthcare Providers: IncredibleEmployment.be  This test is not yet approved or cleared by the [place] FDA and has been authorized for detection and/or diagnosis of SARS-CoV-2 by FDA under an Emergency Use Authorization (EUA). This EUA will remain in effect (meaning this test can be used) for the duration of the COVID-19 declaration under Section 564(b)(1) of the Act, 21 U.S.C. section 360bbb-3(b)(1), unless the authorization is terminated or revoked.  Performed at [facility], [place] [address] [place], [place] [address]      Radiology Studies: DG Abd Portable 1V  Result Date: 11/21/2020 CLINICAL DATA:  Nasogastric tube placement EXAM: PORTABLE ABDOMEN - 1 VIEW COMPARISON:  11/19/2020 FINDINGS: Right flank and pelvis are excluded from view. The visualized abdominal gas pattern is  nonobstructive. Nasogastric tube is seen with its tip position within the gastric fundus. IMPRESSION: Nasogastric tube tip within the gastric fundus. Electronically Signed   By: Fidela Salisbury M.D.   On: 11/21/2020 03:48    Scheduled Meds:  bisacodyl  10 mg Rectal Daily   Chlorhexidine Gluconate Cloth  6 each Topical Daily   insulin aspart  0-6 Units Subcutaneous TID WC   Continuous Infusions:  sodium chloride     lactated ringers 50 mL/hr at 11/21/20 0608     LOS: 2 days   Time spent: 75min  Roy Yepiz C Malicia Blasdel, DO [facility]  If 7PM-7AM, please contact night-coverage [email or web address]  11/21/2020, 7:42 AM

## 2020-11-21 NOTE — Progress Notes (Signed)
RN asked patient about refusal of surgery.  When patient stated he "didn't want a bag" RN explained what patient might expect with ostomy.  Patient spoke with brother and sister-in-law on the phone in RN's presence, all three expressed desire to undergo surgery.  MD notified.

## 2020-11-21 NOTE — Progress Notes (Addendum)
[facility] Gastroenterology Progress Note  [name] 60 y.o. 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 No current nausea/vomiting.  Had a BM this morning.  ROS : Review of Systems  Cardiovascular:  Negative for chest pain and palpitations.  Gastrointestinal:  Positive for abdominal pain and nausea. Negative for blood in stool, constipation, diarrhea, heartburn, melena and vomiting.     Objective: Vital signs in last 24 hours: Vitals:   11/21/20 0903 11/21/20 1137  BP: (!) 180/100 (!) 201/99  Pulse:  80  Resp:  18  Temp:  (!) 97.5 F (36.4 C)  SpO2:  96%    Physical Exam:  General:  Lethargic, oriented, no distress, NGT with bilious output  Head:  Normocephalic, without obvious abnormality, atraumatic  Eyes:  Anicteric sclera, EOMs intact  Lungs:   Clear to auscultation bilaterally, respirations unlabored  Heart:  Regular rate and rhythm, S1, S2 normal  Abdomen:   Distended, hypoactive bowel sounds, mild diffuse tenderness to palpation, no guarding or peritoneal signs    Lab Results: Recent Labs    11/18/20 1503 11/18/20 1627 11/20/20 0318 11/21/20 0617  NA 141   < > 146* 149*  K 3.1*   < > 3.3* 3.2*  CL 106   < > 111 112*  CO2 24  --  27 25  GLUCOSE 211*   < > 183* 185*  BUN 42*   < > 42* 41*  CREATININE 1.48*   < > 2.11* 1.58*  CALCIUM 9.3  --  9.4 9.3  MG 2.3  --   --   --    < > = values in this interval not displayed.    Recent Labs    11/18/20 1503  AST 15  ALT 14  ALKPHOS 77  BILITOT 1.0  PROT 8.1  ALBUMIN 3.7    Recent Labs    11/18/20 1503 11/18/20 1627 11/20/20 0318 11/21/20 0617  WBC 4.1  --  4.1 4.6  NEUTROABS 2.7  --   --   --   HGB 11.0*   < > 10.4* 10.8*  HCT 35.8*   < > 33.7* 34.8*  MCV 80.1  --  80.2 79.8*  PLT 208  --  192 183   < > = values in this interval not displayed.    No results for input(s): LABPROT, INR in  the last 72 hours.    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 This appears unchanged from previous exam. No significant small bowel dilatation identified. - Potassium 3.2  Plan: Replete potassium for goal of 4-5.  Recheck Magnesium, maintain >2.  Continue dulcolax suppository daily.  Repeat abdominal films tomorrow.  [facility] will follow.  Maliek Schellhorn PA-C 11/21/2020, 11:47 AM  Contact #  [phone]

## 2020-11-21 NOTE — Consult Note (Signed)
WOC Nurse Consult Note: Patient receiving care in [place] [facility]. Reason for Consult: BLE and bilateral heels Wound type: heavy amounts of peeling, and some darkened and peeling, skin to bilateral heels and BLEs (shins). Skin tear on left lower buttock--this area on the buttock is NOT a PI. Pressure Injury POA: Yes/No/NA Measurement: Wound bed: Drainage (amount, consistency, odor)  Periwound: Dressing procedure/placement/frequency: Apply [employer] Clear (purple and white tube in clean utility) to BLEs (shins), bilateral heels, left lower buttock skin tear. Can place foam dressings over the BLE shin areas.  Patient has on a prophylactic foam sacral dressing, Crit[employer] clear on BLEs, foam dressings over some peeling areas on the lower bilateral legs, heels are being floated on pillows.  Thank you for the consult.  Discussed plan of care with the patient and bedside nurse.  [place] nurse will not follow at this time.  Please re-consult the [place] team if needed.  Val Riles, RN, MSN, CWOCN, CNS-BC, pager [phone]

## 2020-11-22 ENCOUNTER — Inpatient Hospital Stay (HOSPITAL_COMMUNITY): Payer: Medicare HMO

## 2020-11-22 DIAGNOSIS — N179 Acute kidney failure, unspecified: Secondary | ICD-10-CM | POA: Diagnosis not present

## 2020-11-22 DIAGNOSIS — R1084 Generalized abdominal pain: Secondary | ICD-10-CM | POA: Diagnosis not present

## 2020-11-22 DIAGNOSIS — N1831 Chronic kidney disease, stage 3a: Secondary | ICD-10-CM | POA: Diagnosis not present

## 2020-11-22 LAB — BASIC METABOLIC PANEL
Anion gap: 12 (ref 5–15)
BUN: 40 mg/dL — ABNORMAL HIGH (ref 6–20)
CO2: 25 mmol/L (ref 22–32)
Calcium: 8.9 mg/dL (ref 8.9–10.3)
Chloride: 108 mmol/L (ref 98–111)
Creatinine, Ser: 1.82 mg/dL — ABNORMAL HIGH (ref 0.61–1.24)
GFR, Estimated: 42 mL/min — ABNORMAL LOW (ref >60)
Glucose, Bld: 159 mg/dL — ABNORMAL HIGH (ref 70–99)
Potassium: 3.3 mmol/L — ABNORMAL LOW (ref 3.5–5.1)
Sodium: 145 mmol/L (ref 135–145)

## 2020-11-22 LAB — GLUCOSE, CAPILLARY
Glucose-Capillary: 140 mg/dL — ABNORMAL HIGH (ref 70–99)
Glucose-Capillary: 165 mg/dL — ABNORMAL HIGH (ref 70–99)
Glucose-Capillary: 139 mg/dL — ABNORMAL HIGH (ref 70–99)
Glucose-Capillary: 159 mg/dL — ABNORMAL HIGH (ref 70–99)
Glucose-Capillary: 164 mg/dL — ABNORMAL HIGH (ref 70–99)

## 2020-11-22 LAB — CBC
HCT: 34.8 % — ABNORMAL LOW (ref 39.0–52.0)
Hemoglobin: 10.8 g/dL — ABNORMAL LOW (ref 13.0–17.0)
MCH: 24.8 pg — ABNORMAL LOW (ref 26.0–34.0)
MCHC: 31 g/dL (ref 30.0–36.0)
MCV: 79.8 fL — ABNORMAL LOW (ref 80.0–100.0)
Platelets: 186 10*3/uL (ref 150–400)
RBC: 4.36 MIL/uL (ref 4.22–5.81)
RDW: 17.3 % — ABNORMAL HIGH (ref 11.5–15.5)
WBC: 5.5 10*3/uL (ref 4.0–10.5)
nRBC: 0 % (ref 0.0–0.2)

## 2020-11-22 MED ORDER — HYDRALAZINE HCL 20 MG/ML IJ SOLN
10.0000 mg | Freq: Four times a day (QID) | INTRAMUSCULAR | Status: DC | PRN
  Administered 2020-11-22 – 2020-11-28 (×6): 10 mg via INTRAVENOUS
  Filled 2020-11-22 (×6): qty 1

## 2020-11-22 MED ORDER — HYDROMORPHONE HCL 1 MG/ML IJ SOLN
0.5000 mg | INTRAMUSCULAR | Status: DC | PRN
  Administered 2020-11-22 – 2020-11-23 (×5): 0.5 mg via INTRAVENOUS
  Filled 2020-11-22 (×5): qty 0.5

## 2020-11-22 MED ORDER — KCL-LACTATED RINGERS-D5W 20 MEQ/L IV SOLN
INTRAVENOUS | Status: DC
  Filled 2020-11-22 (×3): qty 1000

## 2020-11-22 MED ORDER — POTASSIUM CHLORIDE 10 MEQ/100ML IV SOLN
10.0000 meq | INTRAVENOUS | Status: AC
  Administered 2020-11-22 (×2): 10 meq via INTRAVENOUS
  Filled 2020-11-22 (×2): qty 100

## 2020-11-22 NOTE — Progress Notes (Signed)
Patient c/o h/a. He's been hypertensive in 200's/100's. Labetalol has been given as ordered alternating with Hydralazine prn and a dose of Fentanyl which has been discontinued. There has been minimal improvement in his BP. Last BP taken 0400 196/96 Dinamap and manually 190/90. Patient currently has an NGT in place and not taking any po meds. Notified provider on call. Will continue to administer prn BP meds as ordered and monitor patient for changes in status.

## 2020-11-22 NOTE — Progress Notes (Signed)
This Probation officer and primary RN attempted to bathe patient this morning. Patient kept saying no. Despite multiple attempts from this writer and primary RN, patient continued to say no because he did not feel well.

## 2020-11-22 NOTE — Progress Notes (Signed)
[facility] Gastroenterology Progress Note  [name] 60 y.o. 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 Endorses diffuse abdominal pain. Patient unaware if he has had a BM. States he doesn't know if he is nauseous or has vomited. Very lethargic. Per RN, had two watery Bms yesterday. RN is unaware of BM status today so far.  ROS : Review of Systems  Constitutional:  Positive for malaise/fatigue. Negative for chills.  Cardiovascular:  Negative for chest pain and palpitations.  Gastrointestinal:  Positive for abdominal pain and constipation. Negative for blood in stool, diarrhea, heartburn, melena, nausea and vomiting.     Objective: Vital signs in last 24 hours: Vitals:   11/22/20 0400 11/22/20 0820  BP: (!) 190/90 (!) 195/105  Pulse:  82  Resp:  13  Temp:  98.2 F (36.8 C)  SpO2:  96%    Physical Exam:  General:  Alert, cooperative, no distress  Head:  Normocephalic, without obvious abnormality, atraumatic  Eyes:  Anicteric sclera, EOM's intact  Lungs:   Clear to auscultation bilaterally, respirations unlabored  Heart:  Regular rate and rhythm, S1, S2 normal  Abdomen:   Distended, hypoactive bowel sounds, mild diffuse tenderness to palpation, no guarding or peritoneal signs     Lab Results: Recent Labs    11/21/20 0617 11/22/20 0403  NA 149* 145  K 3.2* 3.3*  CL 112* 108  CO2 25 25  GLUCOSE 185* 159*  BUN 41* 40*  CREATININE 1.58* 1.82*  CALCIUM 9.3 8.9  MG 2.2  --    No results for input(s): AST, ALT, ALKPHOS, BILITOT, PROT, ALBUMIN in the last 72 hours. Recent Labs    11/21/20 0617 11/22/20 0403  WBC 4.6 5.5  HGB 10.8* 10.8*  HCT 34.8* 34.8*  MCV 79.8* 79.8*  PLT 183 186   No results for input(s): LABPROT, INR in the last 72 hours.    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 - CT 9/23: Considerable diffuse air distension of the colon but without evidence for volvulus, bowel wall thickening, or obstruction -X-ray 9/25: Persistent gaseous distension of the colon. This appears unchanged from previous exam. No significant small bowel dilatation identified. - Potassium 3.3   Plan: Replete potassium for goal of 4-5.  Recheck Magnesium, maintain >2.  Mobilize patient as feasible    Continue dulcolax suppository daily. May need to consider enema therapy.   Repeat abdominal films today   [facility] will follow.  Rhythm Wigfall Radford Pax PA-C 11/22/2020, 8:22 AM  Contact #  [phone]

## 2020-11-22 NOTE — Care Management Important Message (Signed)
Important Message  Patient Details IM Letter given to the Patient. Name: ZALAN SHIDLER MRN: [id number] Date of Birth: 02-19-1961   Medicare Important Message Given:  Yes     Kerin Salen 11/22/2020, 11:37 AM

## 2020-11-22 NOTE — Progress Notes (Signed)
Patient had one small type 7 stool 0700-1900. Abdominal XR confirmed placement of NGT.  Pain 5/10 tolerable without medication since 1115 per patient.  Wound care performed on BLE shins, feet, and heels, and skin tear on buttocks per WOC order.  Patient PO restricted only to occasional ice chips.  Patient agreeable to treatments and education per this RN.  Patient resting in bed, easily aroused.  HTN treated with PRN labetalol and hydralazine, brought down to 177/85.  MD aware.  AM labs resulted K 3.3, replaced and now receiving KCl in LR and D5 at 50 mL/hr. Angie Fava, RN

## 2020-11-22 NOTE — Progress Notes (Signed)
PROGRESS NOTE  [name] XIP:[id number] DOB: 03-Sep-1960 DOA: 11/18/2020 PCP: [name], NP   LOS: 3 days   Brief Narrative / Interim history: Patient noted to have profound [name] is a 60 y.o. male with medical history significant for HFpEF, hypertension, CVA, type 2 diabetes with neuropathy, CKD stage IIIa, polysubstance abuse, chronic pain, depression and hyperlipidemia who presents concerns of worsening abdominal pain.  Found to have significant gastric distention in the colon on imaging, general surgery and GI following.  Of note patient has had bowel decompression in 2021 with Dr. Alessandra Bevels.  Subjective / 24h Interval events: He tells me he continues to have pain today.  Has not been passing gas or bowel movements  Assessment & Plan: Principal Problem Ogilvie syndrome/colonic pseudo-obstruction, stable, potentially worsening -Continue NG tube, clinically with no symptom improvement -Decompression 11/19/20 with GI, minimal improvement, appreciate follow-up -Patient initially refused surgery if needed however today says that he would agree to it if he needs it.  Reconsulted surgery  Active Problems Hypokalemia, ongoing in the setting of poor p.o. intake -Continue to replete, add potassium to IV fluids   AKI on CKD stage IIIa, improving -Creatinine improving with IV fluids, baseline of around 1.2  -Follow closely given HF baseline and risk for volume overload but will continue low IVF 50cc/hr for now   Hypertension -Remains elevated, inconsistent p.o. intake at this point may need to use a clonidine patch   HFpEF, not in acute exacerbation -Last echocardiogram 07/2018 with EF of 65 to 70% and grade 2 diastolic dysfunction with small pericardial effusion.  Mild aortic valve stenosis. -Hold oral agents due to n.p.o.   Type 2 diabetes -Placed on sensitive sliding scale while n.p.o.   History of CAD -Continue aspirin, statin and Coreg when able to tolerate  p.o.   Depression -Continue sertraline when able tolerate p.o.    BPH -Continue tamsulosin when able to take p.o.  Scheduled Meds:  bisacodyl  10 mg Rectal Daily   chlorhexidine  15 mL Mouth Rinse BID   Chlorhexidine Gluconate Cloth  6 each Topical Daily   insulin aspart  0-6 Units Subcutaneous TID WC   mouth rinse  15 mL Mouth Rinse q12n4p   Continuous Infusions:  sodium chloride     dextrose 5% lactated ringers with KCl 20 mEq/L     potassium chloride 10 mEq (11/22/20 1208)   PRN Meds:.acetaminophen, bisacodyl, hydrALAZINE, HYDROmorphone (DILAUDID) injection, labetalol, lip balm, ondansetron (ZOFRAN) IV, phenol  Diet Orders (From admission, onward)     Start     Ordered   11/20/20 1349  Diet clear liquid Room service appropriate? Yes; Fluid consistency: Thin  Diet effective now       Comments: Sips clear liquids  Question Answer Comment  Room service appropriate? Yes   Fluid consistency: Thin      11/20/20 1348            DVT prophylaxis: SCDs Start: 11/18/20 2040     Code Status: Full Code  Family Communication: no family at bedside   Status is: Inpatient  Remains inpatient appropriate because:Inpatient level of care appropriate due to severity of illness  Dispo: The patient is from: Home              Anticipated d/c is to: Home              Patient currently is not medically stable to d/c.   Difficult to place patient No  Level of  care: Med-Surg  Consultants:  GI Surgery   Procedures:  none  Microbiology  none  Antimicrobials: none    Objective: Vitals:   11/22/20 0949 11/22/20 1051 11/22/20 1150 11/22/20 1203  BP: (!) 173/92 (!) 178/99 (!) 175/93 (!) 173/94  Pulse:  85 82 83  Resp:    14  Temp:    98.5 F (36.9 C)  TempSrc:    Axillary  SpO2:    96%  Weight:      Height:        Intake/Output Summary (Last 24 hours) at 11/22/2020 1250 Last data filed at 11/22/2020 0347 Gross per 24 hour  Intake 943.43 ml  Output 2875 ml  Net  -1931.57 ml   Filed Weights   11/19/20 1626  Weight: 127.8 kg    Examination:  Constitutional: NAD Eyes: no scleral icterus ENMT: Mucous membranes are moist.  Neck: normal, supple Respiratory: clear to auscultation bilaterally, no wheezing, no crackles. Normal respiratory effort.  Cardiovascular: Regular rate and rhythm, no murmurs / rubs / gallops. No LE edema. Abdomen: Abdomen is distended but soft, no guarding or rebound. Musculoskeletal: no clubbing / cyanosis.  Skin: no rashes Neurologic: Nonfocal  Data Reviewed: I have independently reviewed following labs and imaging studies   CBC: Recent Labs  Lab 11/17/20 1808 11/18/20 1503 11/18/20 1627 11/20/20 0318 11/21/20 0617 11/22/20 0403  WBC 4.5 4.1  --  4.1 4.6 5.5  NEUTROABS  --  2.7  --   --   --   --   HGB 10.6* 11.0* 11.9* 10.4* 10.8* 10.8*  HCT 34.0* 35.8* 35.0* 33.7* 34.8* 34.8*  MCV 78.9* 80.1  --  80.2 79.8* 79.8*  PLT 188 208  --  192 183 425   Basic Metabolic Panel: Recent Labs  Lab 11/17/20 1808 11/18/20 1503 11/18/20 1627 11/20/20 0318 11/21/20 0617 11/22/20 0403  NA 143 141 145 146* 149* 145  K 3.0* 3.1* 3.2* 3.3* 3.2* 3.3*  CL 110 106 107 111 112* 108  CO2 24 24  --  27 25 25   GLUCOSE 159* 211* 214* 183* 185* 159*  BUN 40* 42* 38* 42* 41* 40*  CREATININE 1.25* 1.48* 1.60* 2.11* 1.58* 1.82*  CALCIUM 9.5 9.3  --  9.4 9.3 8.9  MG  --  2.3  --   --  2.2  --    Liver Function Tests: Recent Labs  Lab 11/17/20 1808 11/18/20 1503  AST 15 15  ALT 13 14  ALKPHOS 73 77  BILITOT 1.1 1.0  PROT 7.8 8.1  ALBUMIN 3.8 3.7   Coagulation Profile: No results for input(s): INR, PROTIME in the last 168 hours. HbA1C: No results for input(s): HGBA1C in the last 72 hours. CBG: Recent Labs  Lab 11/21/20 0745 11/21/20 1132 11/21/20 1653 11/21/20 2021 11/22/20 1211  GLUCAP 157* 134* 139* 119* 140*    Recent Results (from the past 240 hour(s))  Resp Panel by RT-PCR (Flu A&B, Covid)  Nasopharyngeal Swab     Status: None   Collection Time: 11/17/20  6:15 PM   Specimen: Nasopharyngeal Swab; Nasopharyngeal(NP) swabs in vial transport medium  Result Value Ref Range Status   SARS Coronavirus 2 by RT PCR NEGATIVE NEGATIVE Final    Comment: (NOTE) SARS-CoV-2 target nucleic acids are NOT DETECTED.  The SARS-CoV-2 RNA is generally detectable in upper respiratory specimens during the acute phase of infection. The lowest concentration of SARS-CoV-2 viral copies this assay can detect is 138 copies/mL. A negative result does not preclude  SARS-Cov-2 infection and should not be used as the sole basis for treatment or other patient management decisions. A negative result may occur with  improper specimen collection/handling, submission of specimen other than nasopharyngeal swab, presence of viral mutation(s) within the areas targeted by this assay, and inadequate number of viral copies(<138 copies/mL). A negative result must be combined with clinical observations, patient history, and epidemiological information. The expected result is Negative.  Fact Sheet for Patients:  EntrepreneurPulse.com.au  Fact Sheet for Healthcare Providers:  IncredibleEmployment.be  This test is no t yet approved or cleared by the [place] FDA and  has been authorized for detection and/or diagnosis of SARS-CoV-2 by FDA under an Emergency Use Authorization (EUA). This EUA will remain  in effect (meaning this test can be used) for the duration of the COVID-19 declaration under Section 564(b)(1) of the Act, 21 U.S.C.section 360bbb-3(b)(1), unless the authorization is terminated  or revoked sooner.       Influenza A by PCR NEGATIVE NEGATIVE Final   Influenza B by PCR NEGATIVE NEGATIVE Final    Comment: (NOTE) The Xpert Xpress SARS-CoV-2/FLU/RSV plus assay is intended as an aid in the diagnosis of influenza from Nasopharyngeal swab specimens and should not be  used as a sole basis for treatment. Nasal washings and aspirates are unacceptable for Xpert Xpress SARS-CoV-2/FLU/RSV testing.  Fact Sheet for Patients: EntrepreneurPulse.com.au  Fact Sheet for Healthcare Providers: IncredibleEmployment.be  This test is not yet approved or cleared by the [place] FDA and has been authorized for detection and/or diagnosis of SARS-CoV-2 by FDA under an Emergency Use Authorization (EUA). This EUA will remain in effect (meaning this test can be used) for the duration of the COVID-19 declaration under Section 564(b)(1) of the Act, 21 U.S.C. section 360bbb-3(b)(1), unless the authorization is terminated or revoked.  Performed at [facility], [place] [address] [place], [place] [address]   Resp Panel by RT-PCR (Flu A&B, Covid) Nasopharyngeal Swab     Status: None   Collection Time: 11/18/20  8:23 PM   Specimen: Nasopharyngeal Swab; Nasopharyngeal(NP) swabs in vial transport medium  Result Value Ref Range Status   SARS Coronavirus 2 by RT PCR NEGATIVE NEGATIVE Final    Comment: (NOTE) SARS-CoV-2 target nucleic acids are NOT DETECTED.  The SARS-CoV-2 RNA is generally detectable in upper respiratory specimens during the acute phase of infection. The lowest concentration of SARS-CoV-2 viral copies this assay can detect is 138 copies/mL. A negative result does not preclude SARS-Cov-2 infection and should not be used as the sole basis for treatment or other patient management decisions. A negative result may occur with  improper specimen collection/handling, submission of specimen other than nasopharyngeal swab, presence of viral mutation(s) within the areas targeted by this assay, and inadequate number of viral copies(<138 copies/mL). A negative result must be combined with clinical observations, patient history, and epidemiological information. The expected result is Negative.  Fact Sheet for  Patients:  EntrepreneurPulse.com.au  Fact Sheet for Healthcare Providers:  IncredibleEmployment.be  This test is no t yet approved or cleared by the [place] FDA and  has been authorized for detection and/or diagnosis of SARS-CoV-2 by FDA under an Emergency Use Authorization (EUA). This EUA will remain  in effect (meaning this test can be used) for the duration of the COVID-19 declaration under Section 564(b)(1) of the Act, 21 U.S.C.section 360bbb-3(b)(1), unless the authorization is terminated  or revoked sooner.       Influenza A by PCR NEGATIVE NEGATIVE Final  Influenza B by PCR NEGATIVE NEGATIVE Final    Comment: (NOTE) The Xpert Xpress SARS-CoV-2/FLU/RSV plus assay is intended as an aid in the diagnosis of influenza from Nasopharyngeal swab specimens and should not be used as a sole basis for treatment. Nasal washings and aspirates are unacceptable for Xpert Xpress SARS-CoV-2/FLU/RSV testing.  Fact Sheet for Patients: EntrepreneurPulse.com.au  Fact Sheet for Healthcare Providers: IncredibleEmployment.be  This test is not yet approved or cleared by the [place] FDA and has been authorized for detection and/or diagnosis of SARS-CoV-2 by FDA under an Emergency Use Authorization (EUA). This EUA will remain in effect (meaning this test can be used) for the duration of the COVID-19 declaration under Section 564(b)(1) of the Act, 21 U.S.C. section 360bbb-3(b)(1), unless the authorization is terminated or revoked.  Performed at [facility], [place] [address] [place], [place] [address]      Radiology Studies: No results found.  Marzetta Board, MD, PhD [facility]  Between 7 am - 7 pm I am available, please contact me via Amion (for emergencies) or Securechat (non urgent messages)  Between 7 pm - 7 am I am not available, please contact night coverage MD/APP via  Amion

## 2020-11-22 NOTE — Progress Notes (Signed)
3 Days Post-Op  Subjective: Patient is a poor historian and difficult to really tell how he is doing.  He seems somewhat lethargic which is about the same as when I saw him several days ago.  He can't tell me anything except he has some abdominal pain which again he can't tell me if it is worse or better.  He has no idea if he is passing flatus or having BMs, however he has 2 documented BMs yesterday.  He does have an NGT in place with old bloody/bilious drainage.  His NGT was on intermittent suction but up to 170 which likely accounts for some of the bloody output.  We have been asked to see him today for reassessment.    ROS: See above, but otherwise unable to obtain due to mental status and inability to answer questions  Objective: Vital signs in last 24 hours: Temp:  [97.5 F (36.4 C)-98.2 F (36.8 C)] 98.2 F (36.8 C) (09/28 0820) Pulse Rate:  [80-88] 85 (09/28 1051) Resp:  [13-18] 14 (09/28 0917) BP: (173-212)/(86-113) 178/99 (09/28 1051) SpO2:  [94 %-99 %] 96 % (09/28 0917) Last BM Date: 11/21/20  Intake/Output from previous day: 09/27 0701 - 09/28 0700 In: 1379.6 [P.O.:100; I.V.:595.6; NG/GT:90; IV Piggyback:594] Out: 2575 [Urine:725; Emesis/NG output:1850] Intake/Output this shift: Total I/O In: -  Out: 550 [Urine:400; Emesis/NG output:150]  PE: Gen: answers some questions but not well and overall sleepy HEENT: PERRL, NGT in place Heart: regular Lungs; CTAB Abd: some distention with tympany but not overtly tight, greatest in upper abdomen (likely TC), great BS, diffusely tender but no peritonitis, guarding, or rebound.  NGT with dark bloody/bilious output.  NGT turned down to appropriate level.   Ext: MAE Neuro - grossly intact, sensation normal Psych: answers some questions, but unable to give me specific answers to by questions  Lab Results:  Recent Labs    11/21/20 0617 11/22/20 0403  WBC 4.6 5.5  HGB 10.8* 10.8*  HCT 34.8* 34.8*  PLT 183 186    BMET Recent Labs    11/21/20 0617 11/22/20 0403  NA 149* 145  K 3.2* 3.3*  CL 112* 108  CO2 25 25  GLUCOSE 185* 159*  BUN 41* 40*  CREATININE 1.58* 1.82*  CALCIUM 9.3 8.9   PT/INR No results for input(s): LABPROT, INR in the last 72 hours. CMP     Component Value Date/Time   NA 145 11/22/2020 0403   NA 135 (L) 12/31/2013 1336   K 3.3 (L) 11/22/2020 0403   K 3.8 12/31/2013 1336   CL 108 11/22/2020 0403   CL 101 12/31/2013 1336   CO2 25 11/22/2020 0403   CO2 25 12/31/2013 1336   GLUCOSE 159 (H) 11/22/2020 0403   GLUCOSE 249 (H) 12/31/2013 1336   BUN 40 (H) 11/22/2020 0403   BUN 8 12/31/2013 1336   CREATININE 1.82 (H) 11/22/2020 0403   CREATININE 0.88 12/31/2013 1336   CALCIUM 8.9 11/22/2020 0403   CALCIUM 8.6 12/31/2013 1336   PROT 8.1 11/18/2020 1503   PROT 7.8 12/31/2013 1336   ALBUMIN 3.7 11/18/2020 1503   ALBUMIN 3.4 12/31/2013 1336   AST 15 11/18/2020 1503   AST 23 12/31/2013 1336   ALT 14 11/18/2020 1503   ALT 20 12/31/2013 1336   ALKPHOS 77 11/18/2020 1503   ALKPHOS 76 12/31/2013 1336   BILITOT 1.0 11/18/2020 1503   BILITOT 0.6 12/31/2013 1336   GFRNONAA 42 (L) 11/22/2020 0403   GFRNONAA >60 12/31/2013  1336   GFRAA >60 01/29/2018 1705   GFRAA >60 12/31/2013 1336   Lipase     Component Value Date/Time   LIPASE 21 11/18/2020 1503       Studies/Results: DG Abd Portable 1V  Result Date: 11/21/2020 CLINICAL DATA:  Nasogastric tube placement EXAM: PORTABLE ABDOMEN - 1 VIEW COMPARISON:  11/19/2020 FINDINGS: Right flank and pelvis are excluded from view. The visualized abdominal gas pattern is nonobstructive. Nasogastric tube is seen with its tip position within the gastric fundus. IMPRESSION: Nasogastric tube tip within the gastric fundus. Electronically Signed   By: Fidela Salisbury M.D.   On: 11/21/2020 03:48    Anti-infectives: Anti-infectives (From admission, onward)    None        Assessment/Plan Colonic Ogilvie's  -s/p c-scope  decompression. -has required NGT output secondary to nausea/vomiting -GI continues to follow for conservative management with suppositories, maintaining electrolytes, mobilization, minimizing narcotics.  No prokinetic agents have been ordered at this time.  Will defer to them on that. -will await abdominal film today.  If concerning or worsening, could order CT scan to rule out complication such as perforation or ischemia; however, he does not have peritonitis and his WBC is normal with normal vitals so suspicion for a complication from this is low. -would continue conservative management per the GI team at this time, barring a complication noted. -surgical intervention for total abdominal colectomy and ileostomy is an absolute last resort and not something that would be offered at this time as most people are able to get better without needing surgery and through conservative approach.  It is also not a guarantee to "fix" the problem as many patients who has colonic dysmotility have small bowel dysmotility as well and just having an ostomy doesn't correct this issue. -we will follow and see what his plain films look like.  FEN - NGT/IVFs VTE - ok for prophylaxis from our standpoint ID - none currently needed  CHF DM HTN H/O CVA CKD Polysubstance abuse   LOS: 3 days    Henreitta Cea , [facility] Surgery 11/22/2020, 11:10 AM Please see Amion for pager number during day hours 7:00am-4:30pm or 7:00am -11:30am on weekends

## 2020-11-23 DIAGNOSIS — N179 Acute kidney failure, unspecified: Secondary | ICD-10-CM | POA: Diagnosis not present

## 2020-11-23 DIAGNOSIS — R1084 Generalized abdominal pain: Secondary | ICD-10-CM | POA: Diagnosis not present

## 2020-11-23 DIAGNOSIS — N1831 Chronic kidney disease, stage 3a: Secondary | ICD-10-CM | POA: Diagnosis not present

## 2020-11-23 LAB — BASIC METABOLIC PANEL
Anion gap: 11 (ref 5–15)
BUN: 38 mg/dL — ABNORMAL HIGH (ref 6–20)
CO2: 28 mmol/L (ref 22–32)
Calcium: 8.8 mg/dL — ABNORMAL LOW (ref 8.9–10.3)
Chloride: 111 mmol/L (ref 98–111)
Creatinine, Ser: 1.7 mg/dL — ABNORMAL HIGH (ref 0.61–1.24)
GFR, Estimated: 46 mL/min — ABNORMAL LOW (ref >60)
Glucose, Bld: 182 mg/dL — ABNORMAL HIGH (ref 70–99)
Potassium: 3.4 mmol/L — ABNORMAL LOW (ref 3.5–5.1)
Sodium: 150 mmol/L — ABNORMAL HIGH (ref 135–145)

## 2020-11-23 LAB — GLUCOSE, CAPILLARY
Glucose-Capillary: 153 mg/dL — ABNORMAL HIGH (ref 70–99)
Glucose-Capillary: 164 mg/dL — ABNORMAL HIGH (ref 70–99)
Glucose-Capillary: 165 mg/dL — ABNORMAL HIGH (ref 70–99)
Glucose-Capillary: 169 mg/dL — ABNORMAL HIGH (ref 70–99)
Glucose-Capillary: 170 mg/dL — ABNORMAL HIGH (ref 70–99)

## 2020-11-23 LAB — CBC
HCT: 35.6 % — ABNORMAL LOW (ref 39.0–52.0)
Hemoglobin: 10.9 g/dL — ABNORMAL LOW (ref 13.0–17.0)
MCH: 24.8 pg — ABNORMAL LOW (ref 26.0–34.0)
MCHC: 30.6 g/dL (ref 30.0–36.0)
MCV: 81.1 fL (ref 80.0–100.0)
Platelets: 184 10*3/uL (ref 150–400)
RBC: 4.39 MIL/uL (ref 4.22–5.81)
RDW: 18 % — ABNORMAL HIGH (ref 11.5–15.5)
WBC: 5.3 10*3/uL (ref 4.0–10.5)
nRBC: 0 % (ref 0.0–0.2)

## 2020-11-23 LAB — MAGNESIUM: Magnesium: 2.4 mg/dL (ref 1.7–2.4)

## 2020-11-23 MED ORDER — ENOXAPARIN SODIUM 40 MG/0.4ML IJ SOSY: 40.0000 mg | PREFILLED_SYRINGE | INTRAMUSCULAR | Status: DC

## 2020-11-23 MED ORDER — KETOROLAC TROMETHAMINE 15 MG/ML IJ SOLN
15.0000 mg | Freq: Three times a day (TID) | INTRAMUSCULAR | Status: DC | PRN
  Administered 2020-11-23 – 2020-11-24 (×2): 15 mg via INTRAVENOUS
  Filled 2020-11-23 (×2): qty 1

## 2020-11-23 MED ORDER — ENOXAPARIN SODIUM 60 MG/0.6ML IJ SOSY
60.0000 mg | PREFILLED_SYRINGE | INTRAMUSCULAR | Status: DC
  Administered 2020-11-23 – 2020-12-11 (×18): 60 mg via SUBCUTANEOUS
  Filled 2020-11-23 (×19): qty 0.6

## 2020-11-23 MED ORDER — POTASSIUM CHLORIDE 10 MEQ/100ML IV SOLN
INTRAVENOUS | Status: AC
  Filled 2020-11-23: qty 100

## 2020-11-23 MED ORDER — CLONIDINE HCL 0.2 MG/24HR TD PTWK
0.2000 mg | MEDICATED_PATCH | TRANSDERMAL | Status: DC
  Administered 2020-11-23: 0.2 mg via TRANSDERMAL
  Filled 2020-11-23: qty 1

## 2020-11-23 MED ORDER — POTASSIUM CHLORIDE 10 MEQ/100ML IV SOLN
10.0000 meq | INTRAVENOUS | Status: AC
Start: 2020-11-23 — End: 2020-11-23
  Administered 2020-11-23 (×4): 10 meq via INTRAVENOUS
  Filled 2020-11-23 (×2): qty 100

## 2020-11-23 NOTE — Progress Notes (Signed)
PROGRESS NOTE  [name] WYO:[id number] DOB: Nov 30, 1960 DOA: 11/18/2020 PCP: [name], NP   LOS: 4 days   Brief Narrative / Interim history: Patient noted to have profound [name] is a 60 y.o. male with medical history significant for HFpEF, hypertension, CVA, type 2 diabetes with neuropathy, CKD stage IIIa, polysubstance abuse, chronic pain, depression and hyperlipidemia who presents concerns of worsening abdominal pain.  Found to have significant gastric distention in the colon on imaging, general surgery and GI following.  Of note patient has had bowel decompression in 2021 with Dr. Alessandra Bevels.  Subjective / 24h Interval events: He tells me he continues to have pain today.  Has not been passing gas or bowel movements  Assessment & Plan: Principal Problem Ogilvie syndrome/colonic pseudo-obstruction, stable, potentially worsening -Continue NG tube, clinically with no symptom improvement -Decompression 11/19/20 with GI, minimal improvement, appreciate follow-up -Patient initially refused surgery if needed however today says that he would agree to it if he needs it.  Appreciate follow-up  Active Problems Hypokalemia, ongoing in the setting of poor p.o. intake -Continue to replete, add potassium to IV fluids.  He has been refusing labs   AKI on CKD stage IIIa, improving -Creatinine improving with IV fluids, baseline of around 1.2  -Follow closely given HF baseline and risk for volume overload but will continue low IVF 50cc/hr for now   Hypertension -Remains elevated, started clonidine patch   HFpEF, not in acute exacerbation -Last echocardiogram 07/2018 with EF of 65 to 70% and grade 2 diastolic dysfunction with small pericardial effusion.  Mild aortic valve stenosis. -Hold oral agents due to n.p.o.   Type 2 diabetes -Placed on sensitive sliding scale while n.p.o.   History of CAD -Continue aspirin, statin and Coreg when able to tolerate p.o.    Depression -Continue sertraline when able tolerate p.o.    BPH -Continue tamsulosin when able to take p.o.  Scheduled Meds:  bisacodyl  10 mg Rectal Daily   chlorhexidine  15 mL Mouth Rinse BID   Chlorhexidine Gluconate Cloth  6 each Topical Daily   cloNIDine  0.2 mg Transdermal Weekly   insulin aspart  0-6 Units Subcutaneous TID WC   mouth rinse  15 mL Mouth Rinse q12n4p   Continuous Infusions:  sodium chloride     dextrose 5% lactated ringers with KCl 20 mEq/L 50 mL/hr at 11/22/20 2203   PRN Meds:.acetaminophen, hydrALAZINE, HYDROmorphone (DILAUDID) injection, labetalol, lip balm, ondansetron (ZOFRAN) IV, phenol  Diet Orders (From admission, onward)     Start     Ordered   11/20/20 1349  Diet clear liquid Room service appropriate? Yes; Fluid consistency: Thin  Diet effective now       Comments: Sips clear liquids  Question Answer Comment  Room service appropriate? Yes   Fluid consistency: Thin      11/20/20 1348            DVT prophylaxis: SCDs Start: 11/18/20 2040     Code Status: Full Code  Family Communication: no family at bedside   Status is: Inpatient  Remains inpatient appropriate because:Inpatient level of care appropriate due to severity of illness  Dispo: The patient is from: Home              Anticipated d/c is to: Home              Patient currently is not medically stable to d/c.   Difficult to place patient No  Level of care: Med-Surg  Consultants:  GI Surgery   Procedures:  none  Microbiology  none  Antimicrobials: none    Objective: Vitals:   11/22/20 2236 11/23/20 0430 11/23/20 0556 11/23/20 0652  BP: (!) 165/84 (!) 212/107 (!) 200/100 (!) 162/90  Pulse:  88  84  Resp:  18    Temp:  98.2 F (36.8 C)    TempSrc:  Axillary    SpO2:  96%    Weight:      Height:        Intake/Output Summary (Last 24 hours) at 11/23/2020 1229 Last data filed at 11/23/2020 0600 Gross per 24 hour  Intake 821.91 ml  Output 1400 ml  Net  -578.09 ml    Filed Weights   11/19/20 1626  Weight: 127.8 kg    Examination:  Constitutional: No distress, appears more comfortable Eyes: Nonicteric ENMT: mmm Neck: normal, supple Respiratory: Clear bilaterally, no wheezing, normal respiratory effort Cardiovascular: Regular rate and rhythm, no murmurs, trace edema Abdomen: Soft, mild tenderness to palpation throughout, no guarding Musculoskeletal: no clubbing / cyanosis.  Skin: no rashes seen Neurologic: No focal deficits  Data Reviewed: I have independently reviewed following labs and imaging studies   CBC: Recent Labs  Lab 11/17/20 1808 11/18/20 1503 11/18/20 1627 11/20/20 0318 11/21/20 0617 11/22/20 0403  WBC 4.5 4.1  --  4.1 4.6 5.5  NEUTROABS  --  2.7  --   --   --   --   HGB 10.6* 11.0* 11.9* 10.4* 10.8* 10.8*  HCT 34.0* 35.8* 35.0* 33.7* 34.8* 34.8*  MCV 78.9* 80.1  --  80.2 79.8* 79.8*  PLT 188 208  --  192 183 063    Basic Metabolic Panel: Recent Labs  Lab 11/17/20 1808 11/18/20 1503 11/18/20 1627 11/20/20 0318 11/21/20 0617 11/22/20 0403  NA 143 141 145 146* 149* 145  K 3.0* 3.1* 3.2* 3.3* 3.2* 3.3*  CL 110 106 107 111 112* 108  CO2 24 24  --  27 25 25   GLUCOSE 159* 211* 214* 183* 185* 159*  BUN 40* 42* 38* 42* 41* 40*  CREATININE 1.25* 1.48* 1.60* 2.11* 1.58* 1.82*  CALCIUM 9.5 9.3  --  9.4 9.3 8.9  MG  --  2.3  --   --  2.2  --     Liver Function Tests: Recent Labs  Lab 11/17/20 1808 11/18/20 1503  AST 15 15  ALT 13 14  ALKPHOS 73 77  BILITOT 1.1 1.0  PROT 7.8 8.1  ALBUMIN 3.8 3.7    Coagulation Profile: No results for input(s): INR, PROTIME in the last 168 hours. HbA1C: No results for input(s): HGBA1C in the last 72 hours. CBG: Recent Labs  Lab 11/22/20 1601 11/22/20 2048 11/23/20 0000 11/23/20 0448 11/23/20 1140  GLUCAP 165* 164* 164* 170* 165*     Recent Results (from the past 240 hour(s))  Resp Panel by RT-PCR (Flu A&B, Covid) Nasopharyngeal Swab     Status:  None   Collection Time: 11/17/20  6:15 PM   Specimen: Nasopharyngeal Swab; Nasopharyngeal(NP) swabs in vial transport medium  Result Value Ref Range Status   SARS Coronavirus 2 by RT PCR NEGATIVE NEGATIVE Final    Comment: (NOTE) SARS-CoV-2 target nucleic acids are NOT DETECTED.  The SARS-CoV-2 RNA is generally detectable in upper respiratory specimens during the acute phase of infection. The lowest concentration of SARS-CoV-2 viral copies this assay can detect is 138 copies/mL. A negative result does not preclude SARS-Cov-2 infection and should not be used as  the sole basis for treatment or other patient management decisions. A negative result may occur with  improper specimen collection/handling, submission of specimen other than nasopharyngeal swab, presence of viral mutation(s) within the areas targeted by this assay, and inadequate number of viral copies(<138 copies/mL). A negative result must be combined with clinical observations, patient history, and epidemiological information. The expected result is Negative.  Fact Sheet for Patients:  EntrepreneurPulse.com.au  Fact Sheet for Healthcare Providers:  IncredibleEmployment.be  This test is no t yet approved or cleared by the [place] FDA and  has been authorized for detection and/or diagnosis of SARS-CoV-2 by FDA under an Emergency Use Authorization (EUA). This EUA will remain  in effect (meaning this test can be used) for the duration of the COVID-19 declaration under Section 564(b)(1) of the Act, 21 U.S.C.section 360bbb-3(b)(1), unless the authorization is terminated  or revoked sooner.       Influenza A by PCR NEGATIVE NEGATIVE Final   Influenza B by PCR NEGATIVE NEGATIVE Final    Comment: (NOTE) The Xpert Xpress SARS-CoV-2/FLU/RSV plus assay is intended as an aid in the diagnosis of influenza from Nasopharyngeal swab specimens and should not be used as a sole basis for  treatment. Nasal washings and aspirates are unacceptable for Xpert Xpress SARS-CoV-2/FLU/RSV testing.  Fact Sheet for Patients: EntrepreneurPulse.com.au  Fact Sheet for Healthcare Providers: IncredibleEmployment.be  This test is not yet approved or cleared by the [place] FDA and has been authorized for detection and/or diagnosis of SARS-CoV-2 by FDA under an Emergency Use Authorization (EUA). This EUA will remain in effect (meaning this test can be used) for the duration of the COVID-19 declaration under Section 564(b)(1) of the Act, 21 U.S.C. section 360bbb-3(b)(1), unless the authorization is terminated or revoked.  Performed at [facility], [place] [address], [place], [place] [address]   Resp Panel by RT-PCR (Flu A&B, Covid) Nasopharyngeal Swab     Status: None   Collection Time: 11/18/20  8:23 PM   Specimen: Nasopharyngeal Swab; Nasopharyngeal(NP) swabs in vial transport medium  Result Value Ref Range Status   SARS Coronavirus 2 by RT PCR NEGATIVE NEGATIVE Final    Comment: (NOTE) SARS-CoV-2 target nucleic acids are NOT DETECTED.  The SARS-CoV-2 RNA is generally detectable in upper respiratory specimens during the acute phase of infection. The lowest concentration of SARS-CoV-2 viral copies this assay can detect is 138 copies/mL. A negative result does not preclude SARS-Cov-2 infection and should not be used as the sole basis for treatment or other patient management decisions. A negative result may occur with  improper specimen collection/handling, submission of specimen other than nasopharyngeal swab, presence of viral mutation(s) within the areas targeted by this assay, and inadequate number of viral copies(<138 copies/mL). A negative result must be combined with clinical observations, patient history, and epidemiological information. The expected result is Negative.  Fact Sheet for Patients:   EntrepreneurPulse.com.au  Fact Sheet for Healthcare Providers:  IncredibleEmployment.be  This test is no t yet approved or cleared by the [place] FDA and  has been authorized for detection and/or diagnosis of SARS-CoV-2 by FDA under an Emergency Use Authorization (EUA). This EUA will remain  in effect (meaning this test can be used) for the duration of the COVID-19 declaration under Section 564(b)(1) of the Act, 21 U.S.C.section 360bbb-3(b)(1), unless the authorization is terminated  or revoked sooner.       Influenza A by PCR NEGATIVE NEGATIVE Final   Influenza B by PCR NEGATIVE NEGATIVE Final  Comment: (NOTE) The Xpert Xpress SARS-CoV-2/FLU/RSV plus assay is intended as an aid in the diagnosis of influenza from Nasopharyngeal swab specimens and should not be used as a sole basis for treatment. Nasal washings and aspirates are unacceptable for Xpert Xpress SARS-CoV-2/FLU/RSV testing.  Fact Sheet for Patients: EntrepreneurPulse.com.au  Fact Sheet for Healthcare Providers: IncredibleEmployment.be  This test is not yet approved or cleared by the [place] FDA and has been authorized for detection and/or diagnosis of SARS-CoV-2 by FDA under an Emergency Use Authorization (EUA). This EUA will remain in effect (meaning this test can be used) for the duration of the COVID-19 declaration under Section 564(b)(1) of the Act, 21 U.S.C. section 360bbb-3(b)(1), unless the authorization is terminated or revoked.  Performed at [facility], [place] [address] [place], [place] [address]       Radiology Studies: DG Abd Portable 1V  Result Date: 11/22/2020 CLINICAL DATA:  Abdominal distension.  Known Ogilvie syndrome. EXAM: PORTABLE ABDOMEN - 1 VIEW COMPARISON:  11/21/2020 FINDINGS: Gaseous distention of the colon appears similar to prior radiographs and may be slightly more prominent  compared to the prior x-ray. Findings are similar to a prior CT on 11/17/2020. No gross signs of free air. Nasogastric tube again demonstrated extending into the proximal stomach. IMPRESSION: No significant change in appearance of gaseous distension of the colon consistent with Ogilvie syndrome (colonic pseudo-obstruction). Electronically Signed   By: Aletta Edouard M.D.   On: 11/22/2020 14:03    Marzetta Board, MD, PhD [facility]  Between 7 am - 7 pm I am available, please contact me via Amion (for emergencies) or Securechat (non urgent messages)  Between 7 pm - 7 am I am not available, please contact night coverage MD/APP via Amion

## 2020-11-23 NOTE — Progress Notes (Signed)
   11/23/20 0430  Assess: MEWS Score  Temp 98.2 F (36.8 C)  BP (!) 212/107  Pulse Rate 88  Resp 18  SpO2 96 %  O2 Device Room Air  Assess: MEWS Score  MEWS Temp 0  MEWS Systolic 2  MEWS Pulse 0  MEWS RR 0  MEWS LOC 0  MEWS Score 2  MEWS Score Color Yellow  Assess: if the MEWS score is Yellow or Red  Were vital signs taken at a resting state? Yes  Focused Assessment Change from prior assessment (see assessment flowsheet)  Does the patient meet 2 or more of the SIRS criteria? No  MEWS guidelines implemented *See Row Information* Yes  Treat  Pain Scale 0-10  Pain Score 10  Pain Type Acute pain  Pain Location Abdomen  Pain Orientation Left;Upper;Lower;Right  Pain Descriptors / Indicators Aching  Pain Frequency Constant  Pain Onset On-going  Pain Intervention(s) Medication (See eMAR)  Take Vital Signs  Increase Vital Sign Frequency  Yellow: Q 2hr X 2 then Q 4hr X 2, if remains yellow, continue Q 4hrs  Escalate  MEWS: Escalate Yellow: discuss with charge nurse/RN and consider discussing with provider and RRT  Notify: Charge Nurse/RN  Name of Charge Nurse/RN Notified Tawni Carnes  Date Charge Nurse/RN Notified 11/23/20  Time Charge Nurse/RN Notified 0435  Document  Patient Outcome Stabilized after interventions  Progress note created (see row info) Yes  Assess: SIRS CRITERIA  SIRS Temperature  0  SIRS Pulse 0  SIRS Respirations  0  SIRS WBC 0  SIRS Score Sum  0

## 2020-11-23 NOTE — Progress Notes (Signed)
[facility] Progress Note  RAJAN BURGARD 60 y.o. 07-Feb-1961  CC:  Colonic Distention, abdominal pain, suspected Ogilvie's syndrome  Subjective: Patient reports continued abdominal pain, as well as nausea but no vomiting.  Has not had a BM this morning.  Last BM yesterday.    ROS : Review of Systems  Cardiovascular:  Negative for chest pain and palpitations.  Gastrointestinal:  Positive for abdominal pain and nausea. Negative for blood in stool, constipation, diarrhea, heartburn, melena and vomiting.     Objective: Vital signs in last 24 hours: Vitals:   11/23/20 0556 11/23/20 0652  BP: (!) 200/100 (!) 162/90  Pulse:  84  Resp:    Temp:    SpO2:      Physical Exam:  General:  Lethargic, no distress, NGT with dark brown output  Head:  Normocephalic, without obvious abnormality, atraumatic  Eyes:  Anicteric sclera, EOMs intact  Lungs:   Clear to auscultation bilaterally, respirations unlabored  Heart:  Regular rate and rhythm, S1, S2 normal  Abdomen:   Distended, hypoactive bowel sounds x all 4 quadrants, diffuse tenderness to palpation, no guarding or peritoneal signs    Lab Results: Recent Labs    11/21/20 0617 11/22/20 0403  NA 149* 145  K 3.2* 3.3*  CL 112* 108  CO2 25 25  GLUCOSE 185* 159*  BUN 41* 40*  CREATININE 1.58* 1.82*  CALCIUM 9.3 8.9  MG 2.2  --     No results for input(s): AST, ALT, ALKPHOS, BILITOT, PROT, ALBUMIN in the last 72 hours.  Recent Labs    11/21/20 0617 11/22/20 0403  WBC 4.6 5.5  HGB 10.8* 10.8*  HCT 34.8* 34.8*  MCV 79.8* 79.8*  PLT 183 186    No results for input(s): LABPROT, INR in the last 72 hours.    Assessment Colonic Distention, suspected Ogilvie's syndrome s/p colonic decompression 9/25 with persistent/recurrent of symptoms -Repeat abdominal x-ray 9/28: No significant change in appearance of gaseous distension of the colon consistent with Ogilvie syndrome (colonic pseudo-obstruction). - Potassium 3.3 as  of 9/28  Plan: Replete potassium to maintain it between 4-5.  Maintain Magnesium >2.  Tap water enema today.  Minimize narcotics.  Ambulate as tolerated.  Repeat abdominal films tomorrow.  [facility] will follow.  Salley Slaughter PA-C 11/23/2020, 10:53 AM  Contact #  [phone]

## 2020-11-23 NOTE — Consult Note (Signed)
[facility] Inpatient Consult   11/23/2020  [name] December 29, 1960 [id number]  [facility] [ACO] Patient: [name] Medicare   Patient screened for hospitalization with noted extreme high risk score (62%) for unplanned readmission risk to assess for potential [facility] Management service needs for post hospital transition.     Plan: Will continue to follow for progression and disposition plans.  Of note, [facility] Care Management services does not replace or interfere with any services that are arranged by inpatient case management or social work.   Netta Cedars, MSN, [facility] Liaison Nurse Mobile Phone [phone]  Toll free office [phone]

## 2020-11-24 ENCOUNTER — Inpatient Hospital Stay (HOSPITAL_COMMUNITY): Payer: Medicare HMO

## 2020-11-24 DIAGNOSIS — N179 Acute kidney failure, unspecified: Secondary | ICD-10-CM | POA: Diagnosis not present

## 2020-11-24 DIAGNOSIS — N1831 Chronic kidney disease, stage 3a: Secondary | ICD-10-CM | POA: Diagnosis not present

## 2020-11-24 DIAGNOSIS — R1084 Generalized abdominal pain: Secondary | ICD-10-CM | POA: Diagnosis not present

## 2020-11-24 LAB — CBC
HCT: 35.8 % — ABNORMAL LOW (ref 39.0–52.0)
Hemoglobin: 10.7 g/dL — ABNORMAL LOW (ref 13.0–17.0)
MCH: 24.8 pg — ABNORMAL LOW (ref 26.0–34.0)
MCHC: 29.9 g/dL — ABNORMAL LOW (ref 30.0–36.0)
MCV: 82.9 fL (ref 80.0–100.0)
Platelets: 183 10*3/uL (ref 150–400)
RBC: 4.32 MIL/uL (ref 4.22–5.81)
RDW: 18.1 % — ABNORMAL HIGH (ref 11.5–15.5)
WBC: 4.9 10*3/uL (ref 4.0–10.5)
nRBC: 0 % (ref 0.0–0.2)

## 2020-11-24 LAB — BASIC METABOLIC PANEL
Anion gap: 9 (ref 5–15)
BUN: 38 mg/dL — ABNORMAL HIGH (ref 6–20)
CO2: 33 mmol/L — ABNORMAL HIGH (ref 22–32)
Calcium: 9.1 mg/dL (ref 8.9–10.3)
Chloride: 116 mmol/L — ABNORMAL HIGH (ref 98–111)
Creatinine, Ser: 1.69 mg/dL — ABNORMAL HIGH (ref 0.61–1.24)
GFR, Estimated: 46 mL/min — ABNORMAL LOW (ref >60)
Glucose, Bld: 182 mg/dL — ABNORMAL HIGH (ref 70–99)
Potassium: 3.5 mmol/L (ref 3.5–5.1)
Sodium: 158 mmol/L — ABNORMAL HIGH (ref 135–145)

## 2020-11-24 LAB — GLUCOSE, CAPILLARY
Glucose-Capillary: 139 mg/dL — ABNORMAL HIGH (ref 70–99)
Glucose-Capillary: 155 mg/dL — ABNORMAL HIGH (ref 70–99)
Glucose-Capillary: 158 mg/dL — ABNORMAL HIGH (ref 70–99)
Glucose-Capillary: 158 mg/dL — ABNORMAL HIGH (ref 70–99)
Glucose-Capillary: 178 mg/dL — ABNORMAL HIGH (ref 70–99)
Glucose-Capillary: 190 mg/dL — ABNORMAL HIGH (ref 70–99)
Glucose-Capillary: 200 mg/dL — ABNORMAL HIGH (ref 70–99)

## 2020-11-24 MED ORDER — CARVEDILOL 12.5 MG PO TABS
12.5000 mg | ORAL_TABLET | Freq: Two times a day (BID) | ORAL | Status: DC
  Administered 2020-11-24 – 2020-12-05 (×22): 12.5 mg via ORAL
  Filled 2020-11-24 (×22): qty 1

## 2020-11-24 MED ORDER — CLONIDINE HCL 0.3 MG/24HR TD PTWK
0.3000 mg | MEDICATED_PATCH | TRANSDERMAL | Status: DC
  Administered 2020-11-24 – 2020-12-01 (×2): 0.3 mg via TRANSDERMAL
  Filled 2020-11-24 (×2): qty 1

## 2020-11-24 MED ORDER — KETOROLAC TROMETHAMINE 15 MG/ML IJ SOLN
15.0000 mg | Freq: Three times a day (TID) | INTRAMUSCULAR | Status: AC | PRN
  Administered 2020-11-24 – 2020-11-26 (×4): 15 mg via INTRAVENOUS
  Filled 2020-11-24 (×4): qty 1

## 2020-11-24 MED ORDER — POTASSIUM CL IN DEXTROSE 5% 20 MEQ/L IV SOLN
20.0000 meq | INTRAVENOUS | Status: DC
  Administered 2020-11-24 – 2020-11-27 (×7): 20 meq via INTRAVENOUS
  Filled 2020-11-24 (×8): qty 1000

## 2020-11-24 NOTE — Progress Notes (Signed)
PROGRESS NOTE  [name] HAL:[id number] DOB: 06-13-1960 DOA: 11/18/2020 PCP: [name], NP   LOS: 5 days   Brief Narrative / Interim history: Patient noted to have profound [name] is a 60 y.o. male with medical history significant for HFpEF, hypertension, CVA, type 2 diabetes with neuropathy, CKD stage IIIa, polysubstance abuse, chronic pain, depression and hyperlipidemia who presents concerns of worsening abdominal pain.  Found to have significant gastric distention in the colon on imaging, general surgery and GI following.  Of note patient has had bowel decompression in 2021 with Dr. Alessandra Bevels.  Subjective / 24h Interval events: Feels about the same, rates this pain a 7/10.  Assessment & Plan: Principal Problem Ogilvie syndrome/colonic pseudo-obstruction, stable, potentially worsening -Continue NG tube, clinically with no symptom improvement -Decompression 11/19/20 with GI, minimal improvement, appreciate follow-up -Patient initially refused surgery if needed however today says that he would agree to it if he needs it.  Appreciate follow-up -Discontinue Dilaudid yesterday and give Toradol, it appears to be working.  We will continue for now but watch his renal function carefully  Active Problems Hypokalemia, ongoing in the setting of poor p.o. intake -Continue to replete, continue potassium to IV fluids as well as additional rounds  Hypernatremia -Due to NG tube losses.  Change fluids to D5W with potassium, increase rate  AKI on CKD stage IIIa, improving -Creatinine improving with IV fluids, baseline of around 1.2    Hypertension -Remains elevated, increase clonidine patch dose today   HFpEF, not in acute exacerbation -Last echocardiogram 07/2018 with EF of 65 to 70% and grade 2 diastolic dysfunction with small pericardial effusion.  Mild aortic valve stenosis. -Hold oral agents due to n.p.o.   Type 2 diabetes -Placed on sensitive sliding scale while  n.p.o.   History of CAD -Continue aspirin, statin when able to tolerate p.o. resume Coreg today, potentially will not absorb it but give it a try due to persistently high blood pressure   Depression -Continue sertraline when able tolerate p.o.    BPH -Continue tamsulosin when able to take p.o.  Scheduled Meds:  chlorhexidine  15 mL Mouth Rinse BID   Chlorhexidine Gluconate Cloth  6 each Topical Daily   cloNIDine  0.2 mg Transdermal Weekly   enoxaparin (LOVENOX) injection  60 mg Subcutaneous Q24H   insulin aspart  0-6 Units Subcutaneous TID WC   mouth rinse  15 mL Mouth Rinse q12n4p   Continuous Infusions:  sodium chloride     dextrose 5 % with KCl 20 mEq / L 20 mEq (11/24/20 1118)   PRN Meds:.acetaminophen, hydrALAZINE, ketorolac, labetalol, lip balm, ondansetron (ZOFRAN) IV, phenol  Diet Orders (From admission, onward)     Start     Ordered   11/20/20 1349  Diet clear liquid Room service appropriate? Yes; Fluid consistency: Thin  Diet effective now       Comments: Sips clear liquids  Question Answer Comment  Room service appropriate? Yes   Fluid consistency: Thin      11/20/20 1348            DVT prophylaxis: SCDs Start: 11/18/20 2040     Code Status: Full Code  Family Communication: no family at bedside   Status is: Inpatient  Remains inpatient appropriate because:Inpatient level of care appropriate due to severity of illness  Dispo: The patient is from: Home              Anticipated d/c is to: Home  Patient currently is not medically stable to d/c.   Difficult to place patient No  Level of care: Med-Surg  Consultants:  GI Surgery   Procedures:  none  Microbiology  none  Antimicrobials: none    Objective: Vitals:   11/24/20 0154 11/24/20 0430 11/24/20 0556 11/24/20 0911  BP: (!) 179/95 (!) 199/105 (!) 175/97 (!) 194/107  Pulse: 86  (!) 113 81  Resp: 15  16 18   Temp: 97.8 F (36.6 C)  97.7 F (36.5 C) 98.9 F (37.2 C)   TempSrc: Oral  Oral Axillary  SpO2: 99%   99%  Weight:      Height:        Intake/Output Summary (Last 24 hours) at 11/24/2020 1123 Last data filed at 11/24/2020 0600 Gross per 24 hour  Intake 1009.21 ml  Output 1225 ml  Net -215.79 ml    Filed Weights   11/19/20 1626  Weight: 127.8 kg    Examination:  Constitutional: Alert, overall appears comfortable Eyes: Anicteric ENMT: mmm Neck: normal, supple Respiratory: Clear bilaterally, no wheezing, normal respiratory effort Cardiovascular: Regular rate and rhythm, no murmurs, trace edema Abdomen: Soft, mildly tender to palpation, no guarding Musculoskeletal: no clubbing / cyanosis.  Skin: No rashes seen Neurologic: Nonfocal  Data Reviewed: I have independently reviewed following labs and imaging studies   CBC: Recent Labs  Lab 11/18/20 1503 11/18/20 1627 11/20/20 0318 11/21/20 0617 11/22/20 0403 11/23/20 1250 11/24/20 0731  WBC 4.1  --  4.1 4.6 5.5 5.3 4.9  NEUTROABS 2.7  --   --   --   --   --   --   HGB 11.0*   < > 10.4* 10.8* 10.8* 10.9* 10.7*  HCT 35.8*   < > 33.7* 34.8* 34.8* 35.6* 35.8*  MCV 80.1  --  80.2 79.8* 79.8* 81.1 82.9  PLT 208  --  192 183 186 184 183   < > = values in this interval not displayed.    Basic Metabolic Panel: Recent Labs  Lab 11/18/20 1503 11/18/20 1627 11/20/20 0318 11/21/20 0617 11/22/20 0403 11/23/20 1250 11/24/20 0731  NA 141   < > 146* 149* 145 150* 158*  K 3.1*   < > 3.3* 3.2* 3.3* 3.4* 3.5  CL 106   < > 111 112* 108 111 116*  CO2 24  --  27 25 25 28  33*  GLUCOSE 211*   < > 183* 185* 159* 182* 182*  BUN 42*   < > 42* 41* 40* 38* 38*  CREATININE 1.48*   < > 2.11* 1.58* 1.82* 1.70* 1.69*  CALCIUM 9.3  --  9.4 9.3 8.9 8.8* 9.1  MG 2.3  --   --  2.2  --  2.4  --    < > = values in this interval not displayed.    Liver Function Tests: Recent Labs  Lab 11/17/20 1808 11/18/20 1503  AST 15 15  ALT 13 14  ALKPHOS 73 77  BILITOT 1.1 1.0  PROT 7.8 8.1  ALBUMIN 3.8  3.7    Coagulation Profile: No results for input(s): INR, PROTIME in the last 168 hours. HbA1C: No results for input(s): HGBA1C in the last 72 hours. CBG: Recent Labs  Lab 11/23/20 1645 11/23/20 2120 11/24/20 0130 11/24/20 0441 11/24/20 0756  GLUCAP 153* 169* 155* 158* 139*     Recent Results (from the past 240 hour(s))  Resp Panel by RT-PCR (Flu A&B, Covid) Nasopharyngeal Swab     Status: None  Collection Time: 11/17/20  6:15 PM   Specimen: Nasopharyngeal Swab; Nasopharyngeal(NP) swabs in vial transport medium  Result Value Ref Range Status   SARS Coronavirus 2 by RT PCR NEGATIVE NEGATIVE Final    Comment: (NOTE) SARS-CoV-2 target nucleic acids are NOT DETECTED.  The SARS-CoV-2 RNA is generally detectable in upper respiratory specimens during the acute phase of infection. The lowest concentration of SARS-CoV-2 viral copies this assay can detect is 138 copies/mL. A negative result does not preclude SARS-Cov-2 infection and should not be used as the sole basis for treatment or other patient management decisions. A negative result may occur with  improper specimen collection/handling, submission of specimen other than nasopharyngeal swab, presence of viral mutation(s) within the areas targeted by this assay, and inadequate number of viral copies(<138 copies/mL). A negative result must be combined with clinical observations, patient history, and epidemiological information. The expected result is Negative.  Fact Sheet for Patients:  EntrepreneurPulse.com.au  Fact Sheet for Healthcare Providers:  IncredibleEmployment.be  This test is no t yet approved or cleared by the [place] FDA and  has been authorized for detection and/or diagnosis of SARS-CoV-2 by FDA under an Emergency Use Authorization (EUA). This EUA will remain  in effect (meaning this test can be used) for the duration of the COVID-19 declaration under Section  564(b)(1) of the Act, 21 U.S.C.section 360bbb-3(b)(1), unless the authorization is terminated  or revoked sooner.       Influenza A by PCR NEGATIVE NEGATIVE Final   Influenza B by PCR NEGATIVE NEGATIVE Final    Comment: (NOTE) The Xpert Xpress SARS-CoV-2/FLU/RSV plus assay is intended as an aid in the diagnosis of influenza from Nasopharyngeal swab specimens and should not be used as a sole basis for treatment. Nasal washings and aspirates are unacceptable for Xpert Xpress SARS-CoV-2/FLU/RSV testing.  Fact Sheet for Patients: EntrepreneurPulse.com.au  Fact Sheet for Healthcare Providers: IncredibleEmployment.be  This test is not yet approved or cleared by the [place] FDA and has been authorized for detection and/or diagnosis of SARS-CoV-2 by FDA under an Emergency Use Authorization (EUA). This EUA will remain in effect (meaning this test can be used) for the duration of the COVID-19 declaration under Section 564(b)(1) of the Act, 21 U.S.C. section 360bbb-3(b)(1), unless the authorization is terminated or revoked.  Performed at [facility], [place] [address], [place], [place] [address]   Resp Panel by RT-PCR (Flu A&B, Covid) Nasopharyngeal Swab     Status: None   Collection Time: 11/18/20  8:23 PM   Specimen: Nasopharyngeal Swab; Nasopharyngeal(NP) swabs in vial transport medium  Result Value Ref Range Status   SARS Coronavirus 2 by RT PCR NEGATIVE NEGATIVE Final    Comment: (NOTE) SARS-CoV-2 target nucleic acids are NOT DETECTED.  The SARS-CoV-2 RNA is generally detectable in upper respiratory specimens during the acute phase of infection. The lowest concentration of SARS-CoV-2 viral copies this assay can detect is 138 copies/mL. A negative result does not preclude SARS-Cov-2 infection and should not be used as the sole basis for treatment or other patient management decisions. A negative result may occur with   improper specimen collection/handling, submission of specimen other than nasopharyngeal swab, presence of viral mutation(s) within the areas targeted by this assay, and inadequate number of viral copies(<138 copies/mL). A negative result must be combined with clinical observations, patient history, and epidemiological information. The expected result is Negative.  Fact Sheet for Patients:  EntrepreneurPulse.com.au  Fact Sheet for Healthcare Providers:  IncredibleEmployment.be  This test  is no t yet approved or cleared by the [place] and  has been authorized for detection and/or diagnosis of SARS-CoV-2 by FDA under an Emergency Use Authorization (EUA). This EUA will remain  in effect (meaning this test can be used) for the duration of the COVID-19 declaration under Section 564(b)(1) of the Act, 21 U.S.C.section 360bbb-3(b)(1), unless the authorization is terminated  or revoked sooner.       Influenza A by PCR NEGATIVE NEGATIVE Final   Influenza B by PCR NEGATIVE NEGATIVE Final    Comment: (NOTE) The Xpert Xpress SARS-CoV-2/FLU/RSV plus assay is intended as an aid in the diagnosis of influenza from Nasopharyngeal swab specimens and should not be used as a sole basis for treatment. Nasal washings and aspirates are unacceptable for Xpert Xpress SARS-CoV-2/FLU/RSV testing.  Fact Sheet for Patients: EntrepreneurPulse.com.au  Fact Sheet for Healthcare Providers: IncredibleEmployment.be  This test is not yet approved or cleared by the [place] FDA and has been authorized for detection and/or diagnosis of SARS-CoV-2 by FDA under an Emergency Use Authorization (EUA). This EUA will remain in effect (meaning this test can be used) for the duration of the COVID-19 declaration under Section 564(b)(1) of the Act, 21 U.S.C. section 360bbb-3(b)(1), unless the authorization is terminated  or revoked.  Performed at [facility], [place] [address] [place], [place] [address]       Radiology Studies: DG Abd Portable 1V  Result Date: 11/24/2020 CLINICAL DATA:  Abdominal distention EXAM: PORTABLE ABDOMEN - 1 VIEW COMPARISON:  11/22/2020 FINDINGS: Diffuse gaseous distention of bowel, unchanged. No free air or organomegaly. NG tube remains in the stomach. IMPRESSION: Diffuse gaseous distention of bowel, unchanged. Electronically Signed   By: Rolm Baptise M.D.   On: 11/24/2020 07:16    Marzetta Board, MD, PhD [facility]  Between 7 am - 7 pm I am available, please contact me via Amion (for emergencies) or Securechat (non urgent messages)  Between 7 pm - 7 am I am not available, please contact night coverage MD/APP via Amion

## 2020-11-24 NOTE — Care Management Important Message (Signed)
Medicare IM printed for Social Work at [facility] to give to the patient 

## 2020-11-24 NOTE — Progress Notes (Signed)
[facility] Gastroenterology Progress Note  [name] 60 y.o. March 09, 1960  CC:  Colonic Distention, abdominal pain, suspected Ogilvie's syndrome  Subjective: Patient reports continued abdominal pain, as well as nausea but no vomiting.  Has not had a BM despite tap water enema yesterday.  ROS : Review of Systems  Cardiovascular:  Negative for chest pain and palpitations.  Gastrointestinal:  Positive for abdominal pain and nausea. Negative for blood in stool, constipation, diarrhea, heartburn, melena and vomiting.     Objective: Vital signs in last 24 hours: Vitals:   11/24/20 0556 11/24/20 0911  BP: (!) 175/97 (!) 194/107  Pulse: (!) 113 81  Resp: 16 18  Temp: 97.7 F (36.5 C) 98.9 F (37.2 C)  SpO2:  99%    Physical Exam:  General:  Alert, oriented no distress, NGT with dark brown output  Head:  Normocephalic, without obvious abnormality, atraumatic  Eyes:  Anicteric sclera, EOMs intact  Lungs:   Clear to auscultation bilaterally, respirations unlabored  Heart:  Regular rate and rhythm, S1, S2 normal  Abdomen:   Distended, hypoactive bowel sounds x all 4 quadrants, diffuse tenderness to palpation, no guarding or peritoneal signs    Lab Results: Recent Labs    11/23/20 1250 11/24/20 0731  NA 150* 158*  K 3.4* 3.5  CL 111 116*  CO2 28 33*  GLUCOSE 182* 182*  BUN 38* 38*  CREATININE 1.70* 1.69*  CALCIUM 8.8* 9.1  MG 2.4  --     No results for input(s): AST, ALT, ALKPHOS, BILITOT, PROT, ALBUMIN in the last 72 hours.  Recent Labs    11/23/20 1250 11/24/20 0731  WBC 5.3 4.9  HGB 10.9* 10.7*  HCT 35.6* 35.8*  MCV 81.1 82.9  PLT 184 183    No results for input(s): LABPROT, INR in the last 72 hours.    Assessment Colonic Distention, suspected Ogilvie's syndrome s/p colonic decompression 9/25 with persistent/recurrent of symptoms -Repeat abdominal x-ray 9/28: No significant change in appearance of gaseous distension of the colon consistent with Ogilvie  syndrome (colonic pseudo-obstruction). - Potassium 3.5  - No leukocytosis  Plan: Replete potassium to maintain it between 4-5.  Maintain Magnesium >2.  Rectal tube to aid in decompression.  Repeat abdominal films tomorrow.  He may need repeat colonic decompression if no improvement.  [facility] will follow.  Marquies Wanat PA-C 11/24/2020, 12:34 PM  Contact #  [phone]

## 2020-11-25 ENCOUNTER — Inpatient Hospital Stay (HOSPITAL_COMMUNITY): Payer: Medicare HMO

## 2020-11-25 DIAGNOSIS — R1084 Generalized abdominal pain: Secondary | ICD-10-CM | POA: Diagnosis not present

## 2020-11-25 DIAGNOSIS — N1831 Chronic kidney disease, stage 3a: Secondary | ICD-10-CM | POA: Diagnosis not present

## 2020-11-25 DIAGNOSIS — N179 Acute kidney failure, unspecified: Secondary | ICD-10-CM | POA: Diagnosis not present

## 2020-11-25 LAB — BASIC METABOLIC PANEL
Anion gap: 8 (ref 5–15)
BUN: 37 mg/dL — ABNORMAL HIGH (ref 6–20)
CO2: 31 mmol/L (ref 22–32)
Calcium: 8.2 mg/dL — ABNORMAL LOW (ref 8.9–10.3)
Chloride: 105 mmol/L (ref 98–111)
Creatinine, Ser: 1.86 mg/dL — ABNORMAL HIGH (ref 0.61–1.24)
GFR, Estimated: 41 mL/min — ABNORMAL LOW (ref >60)
Glucose, Bld: 196 mg/dL — ABNORMAL HIGH (ref 70–99)
Potassium: 3 mmol/L — ABNORMAL LOW (ref 3.5–5.1)
Sodium: 144 mmol/L (ref 135–145)

## 2020-11-25 LAB — CBC
HCT: 31.2 % — ABNORMAL LOW (ref 39.0–52.0)
Hemoglobin: 9.4 g/dL — ABNORMAL LOW (ref 13.0–17.0)
MCH: 24.9 pg — ABNORMAL LOW (ref 26.0–34.0)
MCHC: 30.1 g/dL (ref 30.0–36.0)
MCV: 82.8 fL (ref 80.0–100.0)
Platelets: 164 10*3/uL (ref 150–400)
RBC: 3.77 MIL/uL — ABNORMAL LOW (ref 4.22–5.81)
RDW: 17.7 % — ABNORMAL HIGH (ref 11.5–15.5)
WBC: 5.4 10*3/uL (ref 4.0–10.5)
nRBC: 0 % (ref 0.0–0.2)

## 2020-11-25 LAB — GLUCOSE, CAPILLARY
Glucose-Capillary: 176 mg/dL — ABNORMAL HIGH (ref 70–99)
Glucose-Capillary: 166 mg/dL — ABNORMAL HIGH (ref 70–99)
Glucose-Capillary: 174 mg/dL — ABNORMAL HIGH (ref 70–99)
Glucose-Capillary: 193 mg/dL — ABNORMAL HIGH (ref 70–99)
Glucose-Capillary: 174 mg/dL — ABNORMAL HIGH (ref 70–99)

## 2020-11-25 MED ORDER — POTASSIUM CHLORIDE 10 MEQ/100ML IV SOLN
10.0000 meq | INTRAVENOUS | Status: AC
  Administered 2020-11-25 (×6): 10 meq via INTRAVENOUS
  Filled 2020-11-25 (×6): qty 100

## 2020-11-25 MED ORDER — POLYETHYLENE GLYCOL 3350 17 G PO PACK
17.0000 g | PACK | Freq: Two times a day (BID) | ORAL | Status: DC
  Administered 2020-11-25 – 2020-12-11 (×29): 17 g via ORAL
  Filled 2020-11-25 (×30): qty 1

## 2020-11-25 NOTE — Progress Notes (Signed)
PROGRESS NOTE  [name] JYN:[id number] DOB: [birth year] DOA: 11/18/2020 PCP: [name], NP   LOS: 6 days   Brief Narrative / Interim history: Patient noted to have profound HAZEL WRINKLE is a 60 y.o. male with medical history significant for HFpEF, hypertension, CVA, type 2 diabetes with neuropathy, CKD stage IIIa, polysubstance abuse, chronic pain, depression and hyperlipidemia who presents concerns of worsening abdominal pain.  Found to have significant gastric distention in the colon on imaging, general surgery and GI following.  Of note patient has had bowel decompression in 2021 with Dr. Alessandra Bevels.  Subjective / 24h Interval events: Alert, tells me he is feeling a little bit better  Assessment & Plan: Principal Problem Ogilvie syndrome/colonic pseudo-obstruction, stable -Continue NG tube, starting to improve a little bit with rectal tube -Decompression 11/19/20 with GI, minimal improvement, appreciate follow-up -Continue rectal tube, will try to use some MiraLAX as suggested by GI, seems to be having more output now  Active Problems Hypokalemia, ongoing in the setting of poor p.o. intake -Continue to replete aggressively, continue potassium to IV fluids as well as additional rounds  Hypernatremia -Due to NG tube losses.  He is on D5W with K, sodium improving  AKI on CKD stage IIIa, improving -Creatinine improving with IV fluids, baseline of around 1.2    Hypertension -Remains on the high side, continue as needed's   HFpEF, not in acute exacerbation -Last echocardiogram 07/2018 with EF of 65 to 70% and grade 2 diastolic dysfunction with small pericardial effusion.  Mild aortic valve stenosis.   Type 2 diabetes -Placed on sensitive sliding scale while n.p.o.   History of CAD -Continue aspirin, statin when able to tolerate p.o. resume Coreg today, potentially will not absorb it but give it a try due to persistently high blood pressure   Depression -Continue  sertraline when able tolerate p.o.    BPH -Continue tamsulosin when able to take p.o.  Scheduled Meds:  carvedilol  12.5 mg Oral BID WC   chlorhexidine  15 mL Mouth Rinse BID   Chlorhexidine Gluconate Cloth  6 each Topical Daily   cloNIDine  0.3 mg Transdermal Weekly   enoxaparin (LOVENOX) injection  60 mg Subcutaneous Q24H   insulin aspart  0-6 Units Subcutaneous TID WC   mouth rinse  15 mL Mouth Rinse q12n4p   polyethylene glycol  17 g Oral BID   Continuous Infusions:  sodium chloride     dextrose 5 % with KCl 20 mEq / L 20 mEq (11/25/20 1020)   potassium chloride 10 mEq (11/25/20 1444)   PRN Meds:.acetaminophen, hydrALAZINE, ketorolac, labetalol, lip balm, ondansetron (ZOFRAN) IV, phenol  Diet Orders (From admission, onward)     Start     Ordered   11/20/20 1349  Diet clear liquid Room service appropriate? Yes; Fluid consistency: Thin  Diet effective now       Comments: Sips clear liquids  Question Answer Comment  Room service appropriate? Yes   Fluid consistency: Thin      11/20/20 1348            DVT prophylaxis: SCDs Start: 11/18/20 2040     Code Status: Full Code  Family Communication: no family at bedside   Status is: Inpatient  Remains inpatient appropriate because:Inpatient level of care appropriate due to severity of illness  Dispo: The patient is from: Home              Anticipated d/c is to: Home  Patient currently is not medically stable to d/c.   Difficult to place patient No  Level of care: Med-Surg  Consultants:  GI Surgery   Procedures:  none  Microbiology  none  Antimicrobials: none    Objective: Vitals:   11/25/20 0700 11/25/20 0907 11/25/20 0947 11/25/20 1126  BP: (!) 216/114 (!) 189/107 (!) 189/107 (!) 182/99  Pulse:  79 79 79  Resp:  16  16  Temp:  98.2 F (36.8 C)  97.8 F (36.6 C)  TempSrc:  Oral  Axillary  SpO2:  100%  100%  Weight:      Height:        Intake/Output Summary (Last 24 hours) at  11/25/2020 1445 Last data filed at 11/25/2020 1100 Gross per 24 hour  Intake 2783.33 ml  Output 1375 ml  Net 1408.33 ml    Filed Weights   11/19/20 1626  Weight: 127.8 kg    Examination:  Constitutional: No distress, appears comfortable Eyes: Anicteric ENMT: mmm Neck: normal, supple Respiratory: Air bilaterally, no wheezing, normal respiratory effort Cardiovascular: Regular rate and rhythm, no murmurs, trace edema Abdomen: Soft, mildly distended, no guarding or rebound Musculoskeletal: no clubbing / cyanosis.  Skin: No rashes seen Neurologic: No focal deficits  Data Reviewed: I have independently reviewed following labs and imaging studies   CBC: Recent Labs  Lab 11/18/20 1503 11/18/20 1627 11/21/20 0617 11/22/20 0403 11/23/20 1250 11/24/20 0731 11/25/20 0402  WBC 4.1   < > 4.6 5.5 5.3 4.9 5.4  NEUTROABS 2.7  --   --   --   --   --   --   HGB 11.0*   < > 10.8* 10.8* 10.9* 10.7* 9.4*  HCT 35.8*   < > 34.8* 34.8* 35.6* 35.8* 31.2*  MCV 80.1   < > 79.8* 79.8* 81.1 82.9 82.8  PLT 208   < > 183 186 184 183 164   < > = values in this interval not displayed.    Basic Metabolic Panel: Recent Labs  Lab 11/18/20 1503 11/18/20 1627 11/21/20 0617 11/22/20 0403 11/23/20 1250 11/24/20 0731 11/25/20 0402  NA 141   < > 149* 145 150* 158* 144  K 3.1*   < > 3.2* 3.3* 3.4* 3.5 3.0*  CL 106   < > 112* 108 111 116* 105  CO2 24   < > 25 25 28  33* 31  GLUCOSE 211*   < > 185* 159* 182* 182* 196*  BUN 42*   < > 41* 40* 38* 38* 37*  CREATININE 1.48*   < > 1.58* 1.82* 1.70* 1.69* 1.86*  CALCIUM 9.3   < > 9.3 8.9 8.8* 9.1 8.2*  MG 2.3  --  2.2  --  2.4  --   --    < > = values in this interval not displayed.    Liver Function Tests: Recent Labs  Lab 11/18/20 1503  AST 15  ALT 14  ALKPHOS 77  BILITOT 1.0  PROT 8.1  ALBUMIN 3.7    Coagulation Profile: No results for input(s): INR, PROTIME in the last 168 hours. HbA1C: No results for input(s): HGBA1C in the last 72  hours. CBG: Recent Labs  Lab 11/24/20 2021 11/24/20 2357 11/25/20 0508 11/25/20 0834 11/25/20 1135  GLUCAP 190* 178* 176* 166* 174*     Recent Results (from the past 240 hour(s))  Resp Panel by RT-PCR (Flu A&B, Covid) Nasopharyngeal Swab     Status: None   Collection Time: 11/17/20  6:15  PM   Specimen: Nasopharyngeal Swab; Nasopharyngeal(NP) swabs in vial transport medium  Result Value Ref Range Status   SARS Coronavirus 2 by RT PCR NEGATIVE NEGATIVE Final    Comment: (NOTE) SARS-CoV-2 target nucleic acids are NOT DETECTED.  The SARS-CoV-2 RNA is generally detectable in upper respiratory specimens during the acute phase of infection. The lowest concentration of SARS-CoV-2 viral copies this assay can detect is 138 copies/mL. A negative result does not preclude SARS-Cov-2 infection and should not be used as the sole basis for treatment or other patient management decisions. A negative result may occur with  improper specimen collection/handling, submission of specimen other than nasopharyngeal swab, presence of viral mutation(s) within the areas targeted by this assay, and inadequate number of viral copies(<138 copies/mL). A negative result must be combined with clinical observations, patient history, and epidemiological information. The expected result is Negative.  Fact Sheet for Patients:  EntrepreneurPulse.com.au  Fact Sheet for Healthcare Providers:  IncredibleEmployment.be  This test is no t yet approved or cleared by the [place] FDA and  has been authorized for detection and/or diagnosis of SARS-CoV-2 by FDA under an Emergency Use Authorization (EUA). This EUA will remain  in effect (meaning this test can be used) for the duration of the COVID-19 declaration under Section 564(b)(1) of the Act, 21 U.S.C.section 360bbb-3(b)(1), unless the authorization is terminated  or revoked sooner.       Influenza A by PCR NEGATIVE  NEGATIVE Final   Influenza B by PCR NEGATIVE NEGATIVE Final    Comment: (NOTE) The Xpert Xpress SARS-CoV-2/FLU/RSV plus assay is intended as an aid in the diagnosis of influenza from Nasopharyngeal swab specimens and should not be used as a sole basis for treatment. Nasal washings and aspirates are unacceptable for Xpert Xpress SARS-CoV-2/FLU/RSV testing.  Fact Sheet for Patients: EntrepreneurPulse.com.au  Fact Sheet for Healthcare Providers: IncredibleEmployment.be  This test is not yet approved or cleared by the [place] FDA and has been authorized for detection and/or diagnosis of SARS-CoV-2 by FDA under an Emergency Use Authorization (EUA). This EUA will remain in effect (meaning this test can be used) for the duration of the COVID-19 declaration under Section 564(b)(1) of the Act, 21 U.S.C. section 360bbb-3(b)(1), unless the authorization is terminated or revoked.  Performed at [facility], [place] [address], [place], [place] [address]   Resp Panel by RT-PCR (Flu A&B, Covid) Nasopharyngeal Swab     Status: None   Collection Time: 11/18/20  8:23 PM   Specimen: Nasopharyngeal Swab; Nasopharyngeal(NP) swabs in vial transport medium  Result Value Ref Range Status   SARS Coronavirus 2 by RT PCR NEGATIVE NEGATIVE Final    Comment: (NOTE) SARS-CoV-2 target nucleic acids are NOT DETECTED.  The SARS-CoV-2 RNA is generally detectable in upper respiratory specimens during the acute phase of infection. The lowest concentration of SARS-CoV-2 viral copies this assay can detect is 138 copies/mL. A negative result does not preclude SARS-Cov-2 infection and should not be used as the sole basis for treatment or other patient management decisions. A negative result may occur with  improper specimen collection/handling, submission of specimen other than nasopharyngeal swab, presence of viral mutation(s) within the areas targeted by  this assay, and inadequate number of viral copies(<138 copies/mL). A negative result must be combined with clinical observations, patient history, and epidemiological information. The expected result is Negative.  Fact Sheet for Patients:  EntrepreneurPulse.com.au  Fact Sheet for Healthcare Providers:  IncredibleEmployment.be  This test is no t yet approved  or cleared by the [place] and  has been authorized for detection and/or diagnosis of SARS-CoV-2 by FDA under an Emergency Use Authorization (EUA). This EUA will remain  in effect (meaning this test can be used) for the duration of the COVID-19 declaration under Section 564(b)(1) of the Act, 21 U.S.C.section 360bbb-3(b)(1), unless the authorization is terminated  or revoked sooner.       Influenza A by PCR NEGATIVE NEGATIVE Final   Influenza B by PCR NEGATIVE NEGATIVE Final    Comment: (NOTE) The Xpert Xpress SARS-CoV-2/FLU/RSV plus assay is intended as an aid in the diagnosis of influenza from Nasopharyngeal swab specimens and should not be used as a sole basis for treatment. Nasal washings and aspirates are unacceptable for Xpert Xpress SARS-CoV-2/FLU/RSV testing.  Fact Sheet for Patients: EntrepreneurPulse.com.au  Fact Sheet for Healthcare Providers: IncredibleEmployment.be  This test is not yet approved or cleared by the [place] FDA and has been authorized for detection and/or diagnosis of SARS-CoV-2 by FDA under an Emergency Use Authorization (EUA). This EUA will remain in effect (meaning this test can be used) for the duration of the COVID-19 declaration under Section 564(b)(1) of the Act, 21 U.S.C. section 360bbb-3(b)(1), unless the authorization is terminated or revoked.  Performed at [facility], [place] [address] [place], [place] [address]       Radiology Studies: DG Abd Portable 1V  Result Date:  11/25/2020 CLINICAL DATA:  Abdominal distention EXAM: PORTABLE ABDOMEN - 1 VIEW COMPARISON:  November 24, 2020 at 4:41 a.m. FINDINGS: Stable NG tube, terminating in the stomach. Air-filled prominent/dilated loops of bowel, primarily colon. The findings are similar in the interval. IMPRESSION: Continued diffuse gaseous distention of the bowel, unchanged. Stable NG tube, terminating in the stomach. Electronically Signed   By: Dorise Bullion III M.D.   On: 11/25/2020 08:01    Marzetta Board, MD, PhD [facility]  Between 7 am - 7 pm I am available, please contact me via Amion (for emergencies) or Securechat (non urgent messages)  Between 7 pm - 7 am I am not available, please contact night coverage MD/APP via Amion

## 2020-11-25 NOTE — Progress Notes (Signed)
[name] 10:53 AM  Subjective: Patient seen and examined and case discussed with my partner Dr. Paulita Fujita and x-rays reviewed in hospital computer chart reviewed and patient familiar to me from previous hospital stays and he wants to eat and his pain is not that bad and the rectal tube helps although he does not like it and has no new complaints  Objective: Vital signs stable afebrile abdomen is soft minimal discomfort no guarding or rebound x-ray about the same no small bowel or colonic dilation potassium even lower 3.0 I believe that is probably the main problem  Assessment: Chronic ileus worse with low potassium although lack of mobility and medicines including narcotics are playing a role  Plan: Increase potassium greater than 4 and preferably even 4.5 consider adding MiraLAX to his clear liquids or when better part of his daily routine and call me this weekend if I could be of any further assistance otherwise we will asked him to check on early next week  [facility]  office [phone] After 5PM or if no answer call [phone]

## 2020-11-26 DIAGNOSIS — R1084 Generalized abdominal pain: Secondary | ICD-10-CM | POA: Diagnosis not present

## 2020-11-26 DIAGNOSIS — N1831 Chronic kidney disease, stage 3a: Secondary | ICD-10-CM | POA: Diagnosis not present

## 2020-11-26 DIAGNOSIS — N179 Acute kidney failure, unspecified: Secondary | ICD-10-CM | POA: Diagnosis not present

## 2020-11-26 LAB — CBC
HCT: 31.5 % — ABNORMAL LOW (ref 39.0–52.0)
Hemoglobin: 9.8 g/dL — ABNORMAL LOW (ref 13.0–17.0)
MCH: 25.3 pg — ABNORMAL LOW (ref 26.0–34.0)
MCHC: 31.1 g/dL (ref 30.0–36.0)
MCV: 81.4 fL (ref 80.0–100.0)
Platelets: 168 10*3/uL (ref 150–400)
RBC: 3.87 MIL/uL — ABNORMAL LOW (ref 4.22–5.81)
RDW: 17.3 % — ABNORMAL HIGH (ref 11.5–15.5)
WBC: 4.8 10*3/uL (ref 4.0–10.5)
nRBC: 0 % (ref 0.0–0.2)

## 2020-11-26 LAB — GLUCOSE, CAPILLARY
Glucose-Capillary: 196 mg/dL — ABNORMAL HIGH (ref 70–99)
Glucose-Capillary: 193 mg/dL — ABNORMAL HIGH (ref 70–99)
Glucose-Capillary: 203 mg/dL — ABNORMAL HIGH (ref 70–99)
Glucose-Capillary: 164 mg/dL — ABNORMAL HIGH (ref 70–99)
Glucose-Capillary: 148 mg/dL — ABNORMAL HIGH (ref 70–99)

## 2020-11-26 LAB — BASIC METABOLIC PANEL
Anion gap: 10 (ref 5–15)
BUN: 32 mg/dL — ABNORMAL HIGH (ref 6–20)
CO2: 32 mmol/L (ref 22–32)
Calcium: 8.5 mg/dL — ABNORMAL LOW (ref 8.9–10.3)
Chloride: 99 mmol/L (ref 98–111)
Creatinine, Ser: 1.54 mg/dL — ABNORMAL HIGH (ref 0.61–1.24)
GFR, Estimated: 52 mL/min — ABNORMAL LOW (ref >60)
Glucose, Bld: 231 mg/dL — ABNORMAL HIGH (ref 70–99)
Potassium: 3.2 mmol/L — ABNORMAL LOW (ref 3.5–5.1)
Sodium: 141 mmol/L (ref 135–145)

## 2020-11-26 LAB — MAGNESIUM: Magnesium: 2 mg/dL (ref 1.7–2.4)

## 2020-11-26 LAB — PHOSPHORUS: Phosphorus: 2.8 mg/dL (ref 2.5–4.6)

## 2020-11-26 MED ORDER — POTASSIUM CHLORIDE 10 MEQ/100ML IV SOLN
10.0000 meq | INTRAVENOUS | Status: AC
  Administered 2020-11-26 (×5): 10 meq via INTRAVENOUS
  Filled 2020-11-26 (×5): qty 100

## 2020-11-26 MED ORDER — POTASSIUM CHLORIDE 20 MEQ PO PACK
40.0000 meq | PACK | Freq: Once | ORAL | Status: AC
  Administered 2020-11-26: 40 meq via ORAL
  Filled 2020-11-26: qty 2

## 2020-11-26 NOTE — Progress Notes (Signed)
[place] PROGRESS NOTE  [name] WCB:[id number] DOB: May 31, 1960 DOA: 11/18/2020 PCP: [name], NP   LOS: 7 days   Brief Narrative / Interim history: Patient noted to have profound [name] is a 60 y.o. male with medical history significant for HFpEF, hypertension, CVA, type 2 diabetes with neuropathy, CKD stage IIIa, polysubstance abuse, chronic pain, depression and hyperlipidemia who presents concerns of worsening abdominal pain.  Found to have significant gastric distention in the colon on imaging, general surgery and GI following.  Of note patient has had bowel decompression in 2021 with Dr. Alessandra Bevels.  Subjective / 24h Interval events: Alert, wants to eat some scrambled eggs this morning.  No nausea or vomiting.  Has abdominal pain but is bearable  Assessment & Plan: Principal Problem Ogilvie syndrome/colonic pseudo-obstruction, stable -Continue NG tube, he is having more output in his rectal tube -Decompression 11/19/20 with GI, minimal improvement, appreciate follow-up -Continue rectal tube, continue MiraLAX -Give a trial of 6 hours of clamping the NG tube and see how he feels, clinically appears slightly improved.  His NG tube output seems to be high but he is having ice chips/water and clear liquids which come right out through the suction  Active Problems Hypokalemia, ongoing in the setting of poor p.o. intake -Continue to replete aggressively today  Hypernatremia -Due to NG tube losses.  He is on D5W with K, sodium now normalized  AKI on CKD stage IIIa, improving -Creatinine improving with IV fluids, baseline of around 1.2    Hypertension -Remains on the high side, but overall better than few days ago   HFpEF, not in acute exacerbation -Last echocardiogram 07/2018 with EF of 65 to 70% and grade 2 diastolic dysfunction with small pericardial effusion.  Mild aortic valve stenosis.   Type 2 diabetes -Placed on sensitive sliding scale while n.p.o.   History  of CAD -Continue aspirin, statin when able to tolerate p.o. resume Coreg today, potentially will not absorb it but give it a try due to persistently high blood pressure   Depression -Continue sertraline when able tolerate p.o.    BPH -Continue tamsulosin when able to take p.o.  Scheduled Meds:  carvedilol  12.5 mg Oral BID WC   chlorhexidine  15 mL Mouth Rinse BID   Chlorhexidine Gluconate Cloth  6 each Topical Daily   cloNIDine  0.3 mg Transdermal Weekly   enoxaparin (LOVENOX) injection  60 mg Subcutaneous Q24H   insulin aspart  0-6 Units Subcutaneous TID WC   mouth rinse  15 mL Mouth Rinse q12n4p   polyethylene glycol  17 g Oral BID   Continuous Infusions:  sodium chloride     dextrose 5 % with KCl 20 mEq / L 100 mL/hr at 11/26/20 0647   potassium chloride 10 mEq (11/26/20 1126)   PRN Meds:.acetaminophen, hydrALAZINE, ketorolac, labetalol, lip balm, ondansetron (ZOFRAN) IV, phenol  Diet Orders (From admission, onward)     Start     Ordered   11/20/20 1349  Diet clear liquid Room service appropriate? Yes; Fluid consistency: Thin  Diet effective now       Comments: Sips clear liquids  Question Answer Comment  Room service appropriate? Yes   Fluid consistency: Thin      11/20/20 1348            DVT prophylaxis: SCDs Start: 11/18/20 2040     Code Status: Full Code  Family Communication: no family at bedside   Status is: Inpatient  Remains inpatient appropriate  because:Inpatient level of care appropriate due to severity of illness  Dispo: The patient is from: Home              Anticipated d/c is to: Home              Patient currently is not medically stable to d/c.   Difficult to place patient No  Level of care: Med-Surg  Consultants:  GI Surgery   Procedures:  none  Microbiology  none  Antimicrobials: none    Objective: Vitals:   11/26/20 0400 11/26/20 0500 11/26/20 0600 11/26/20 1013  BP: (!) 183/95 (!) 194/104 (!) 165/97 (!) 176/96   Pulse: 77   78  Resp: 18   16  Temp: 98.7 F (37.1 C)   98.2 F (36.8 C)  TempSrc: Oral   Oral  SpO2: 93%   100%  Weight:      Height:        Intake/Output Summary (Last 24 hours) at 11/26/2020 1131 Last data filed at 11/26/2020 7824 Gross per 24 hour  Intake 5768.15 ml  Output 3890 ml  Net 1878.15 ml    Filed Weights   11/19/20 1626  Weight: 127.8 kg    Examination:  Constitutional: NAD, comfortable Eyes: No scleral icterus ENMT: Moist mucous membranes Neck: normal, supple Respiratory: Clear bilaterally, no wheezing Cardiovascular: Regular rate and rhythm, trace edema Abdomen: Appears mildly distended, soft, nontender, no guarding or rebound Musculoskeletal: no clubbing / cyanosis.  Skin: No rashes seen Neurologic: No focal deficits  Data Reviewed: I have independently reviewed following labs and imaging studies   CBC: Recent Labs  Lab 11/22/20 0403 11/23/20 1250 11/24/20 0731 11/25/20 0402 11/26/20 0407  WBC 5.5 5.3 4.9 5.4 4.8  HGB 10.8* 10.9* 10.7* 9.4* 9.8*  HCT 34.8* 35.6* 35.8* 31.2* 31.5*  MCV 79.8* 81.1 82.9 82.8 81.4  PLT 186 184 183 164 235    Basic Metabolic Panel: Recent Labs  Lab 11/21/20 0617 11/22/20 0403 11/23/20 1250 11/24/20 0731 11/25/20 0402 11/26/20 0407  NA 149* 145 150* 158* 144 141  K 3.2* 3.3* 3.4* 3.5 3.0* 3.2*  CL 112* 108 111 116* 105 99  CO2 25 25 28  33* 31 32  GLUCOSE 185* 159* 182* 182* 196* 231*  BUN 41* 40* 38* 38* 37* 32*  CREATININE 1.58* 1.82* 1.70* 1.69* 1.86* 1.54*  CALCIUM 9.3 8.9 8.8* 9.1 8.2* 8.5*  MG 2.2  --  2.4  --   --  2.0  PHOS  --   --   --   --   --  2.8    Liver Function Tests: No results for input(s): AST, ALT, ALKPHOS, BILITOT, PROT, ALBUMIN in the last 168 hours.  Coagulation Profile: No results for input(s): INR, PROTIME in the last 168 hours. HbA1C: No results for input(s): HGBA1C in the last 72 hours. CBG: Recent Labs  Lab 11/25/20 0834 11/25/20 1135 11/25/20 1625  11/25/20 2011 11/26/20 0736  GLUCAP 166* 174* 193* 174* 196*     Recent Results (from the past 240 hour(s))  Resp Panel by RT-PCR (Flu A&B, Covid) Nasopharyngeal Swab     Status: None   Collection Time: 11/17/20  6:15 PM   Specimen: Nasopharyngeal Swab; Nasopharyngeal(NP) swabs in vial transport medium  Result Value Ref Range Status   SARS Coronavirus 2 by RT PCR NEGATIVE NEGATIVE Final    Comment: (NOTE) SARS-CoV-2 target nucleic acids are NOT DETECTED.  The SARS-CoV-2 RNA is generally detectable in upper respiratory specimens during the acute  phase of infection. The lowest concentration of SARS-CoV-2 viral copies this assay can detect is 138 copies/mL. A negative result does not preclude SARS-Cov-2 infection and should not be used as the sole basis for treatment or other patient management decisions. A negative result may occur with  improper specimen collection/handling, submission of specimen other than nasopharyngeal swab, presence of viral mutation(s) within the areas targeted by this assay, and inadequate number of viral copies(<138 copies/mL). A negative result must be combined with clinical observations, patient history, and epidemiological information. The expected result is Negative.  Fact Sheet for Patients:  EntrepreneurPulse.com.au  Fact Sheet for Healthcare Providers:  IncredibleEmployment.be  This test is no t yet approved or cleared by the [place] FDA and  has been authorized for detection and/or diagnosis of SARS-CoV-2 by FDA under an Emergency Use Authorization (EUA). This EUA will remain  in effect (meaning this test can be used) for the duration of the COVID-19 declaration under Section 564(b)(1) of the Act, 21 U.S.C.section 360bbb-3(b)(1), unless the authorization is terminated  or revoked sooner.       Influenza A by PCR NEGATIVE NEGATIVE Final   Influenza B by PCR NEGATIVE NEGATIVE Final    Comment:  (NOTE) The Xpert Xpress SARS-CoV-2/FLU/RSV plus assay is intended as an aid in the diagnosis of influenza from Nasopharyngeal swab specimens and should not be used as a sole basis for treatment. Nasal washings and aspirates are unacceptable for Xpert Xpress SARS-CoV-2/FLU/RSV testing.  Fact Sheet for Patients: EntrepreneurPulse.com.au  Fact Sheet for Healthcare Providers: IncredibleEmployment.be  This test is not yet approved or cleared by the [place] FDA and has been authorized for detection and/or diagnosis of SARS-CoV-2 by FDA under an Emergency Use Authorization (EUA). This EUA will remain in effect (meaning this test can be used) for the duration of the COVID-19 declaration under Section 564(b)(1) of the Act, 21 U.S.C. section 360bbb-3(b)(1), unless the authorization is terminated or revoked.  Performed at [facility], [place] [address] [place], [place] [address]   Resp Panel by RT-PCR (Flu A&B, Covid) Nasopharyngeal Swab     Status: None   Collection Time: 11/18/20  8:23 PM   Specimen: Nasopharyngeal Swab; Nasopharyngeal(NP) swabs in vial transport medium  Result Value Ref Range Status   SARS Coronavirus 2 by RT PCR NEGATIVE NEGATIVE Final    Comment: (NOTE) SARS-CoV-2 target nucleic acids are NOT DETECTED.  The SARS-CoV-2 RNA is generally detectable in upper respiratory specimens during the acute phase of infection. The lowest concentration of SARS-CoV-2 viral copies this assay can detect is 138 copies/mL. A negative result does not preclude SARS-Cov-2 infection and should not be used as the sole basis for treatment or other patient management decisions. A negative result may occur with  improper specimen collection/handling, submission of specimen other than nasopharyngeal swab, presence of viral mutation(s) within the areas targeted by this assay, and inadequate number of viral copies(<138 copies/mL). A  negative result must be combined with clinical observations, patient history, and epidemiological information. The expected result is Negative.  Fact Sheet for Patients:  EntrepreneurPulse.com.au  Fact Sheet for Healthcare Providers:  IncredibleEmployment.be  This test is no t yet approved or cleared by the [place] FDA and  has been authorized for detection and/or diagnosis of SARS-CoV-2 by FDA under an Emergency Use Authorization (EUA). This EUA will remain  in effect (meaning this test can be used) for the duration of the COVID-19 declaration under Section 564(b)(1) of the Act, 21 U.S.C.section 360bbb-3(b)(1),  unless the authorization is terminated  or revoked sooner.       Influenza A by PCR NEGATIVE NEGATIVE Final   Influenza B by PCR NEGATIVE NEGATIVE Final    Comment: (NOTE) The Xpert Xpress SARS-CoV-2/FLU/RSV plus assay is intended as an aid in the diagnosis of influenza from Nasopharyngeal swab specimens and should not be used as a sole basis for treatment. Nasal washings and aspirates are unacceptable for Xpert Xpress SARS-CoV-2/FLU/RSV testing.  Fact Sheet for Patients: EntrepreneurPulse.com.au  Fact Sheet for Healthcare Providers: IncredibleEmployment.be  This test is not yet approved or cleared by the [place] FDA and has been authorized for detection and/or diagnosis of SARS-CoV-2 by FDA under an Emergency Use Authorization (EUA). This EUA will remain in effect (meaning this test can be used) for the duration of the COVID-19 declaration under Section 564(b)(1) of the Act, 21 U.S.C. section 360bbb-3(b)(1), unless the authorization is terminated or revoked.  Performed at [facility], [place] [address], [place], [place] [address]       Radiology Studies: No results found.  Marzetta Board, MD, PhD [facility]  Between 7 am - 7 pm I am available,  please contact me via Amion (for emergencies) or Securechat (non urgent messages)  Between 7 pm - 7 am I am not available, please contact night coverage MD/APP via Amion

## 2020-11-26 NOTE — Consult Note (Signed)
WOC consulted last week for same heel wound, see consult note.  Prevalon boots ordered, skin care ordered for heel PI. Deep tissue pressure injuries; stable.  Cover with foam for protection.   Prevalon for offloading which will be key intervention.  Appropriate treatment in place   Re consult if needed, will not follow at this time. Thanks  Brayon Bielefeld [employer], RN,CWOCN, CNS, [place] [phone])

## 2020-11-27 ENCOUNTER — Inpatient Hospital Stay (HOSPITAL_COMMUNITY): Payer: Medicare HMO

## 2020-11-27 DIAGNOSIS — R1084 Generalized abdominal pain: Secondary | ICD-10-CM | POA: Diagnosis not present

## 2020-11-27 DIAGNOSIS — N179 Acute kidney failure, unspecified: Secondary | ICD-10-CM | POA: Diagnosis not present

## 2020-11-27 DIAGNOSIS — N1831 Chronic kidney disease, stage 3a: Secondary | ICD-10-CM | POA: Diagnosis not present

## 2020-11-27 LAB — CBC
HCT: 29 % — ABNORMAL LOW (ref 39.0–52.0)
Hemoglobin: 9.2 g/dL — ABNORMAL LOW (ref 13.0–17.0)
MCH: 25.3 pg — ABNORMAL LOW (ref 26.0–34.0)
MCHC: 31.7 g/dL (ref 30.0–36.0)
MCV: 79.9 fL — ABNORMAL LOW (ref 80.0–100.0)
Platelets: 149 10*3/uL — ABNORMAL LOW (ref 150–400)
RBC: 3.63 MIL/uL — ABNORMAL LOW (ref 4.22–5.81)
RDW: 17 % — ABNORMAL HIGH (ref 11.5–15.5)
WBC: 5.1 10*3/uL (ref 4.0–10.5)
nRBC: 0 % (ref 0.0–0.2)

## 2020-11-27 LAB — BASIC METABOLIC PANEL
Anion gap: 7 (ref 5–15)
BUN: 30 mg/dL — ABNORMAL HIGH (ref 6–20)
CO2: 30 mmol/L (ref 22–32)
Calcium: 7.9 mg/dL — ABNORMAL LOW (ref 8.9–10.3)
Chloride: 95 mmol/L — ABNORMAL LOW (ref 98–111)
Creatinine, Ser: 1.58 mg/dL — ABNORMAL HIGH (ref 0.61–1.24)
GFR, Estimated: 50 mL/min — ABNORMAL LOW (ref >60)
Glucose, Bld: 144 mg/dL — ABNORMAL HIGH (ref 70–99)
Potassium: 3.3 mmol/L — ABNORMAL LOW (ref 3.5–5.1)
Sodium: 132 mmol/L — ABNORMAL LOW (ref 135–145)

## 2020-11-27 LAB — GLUCOSE, CAPILLARY
Glucose-Capillary: 157 mg/dL — ABNORMAL HIGH (ref 70–99)
Glucose-Capillary: 209 mg/dL — ABNORMAL HIGH (ref 70–99)
Glucose-Capillary: 155 mg/dL — ABNORMAL HIGH (ref 70–99)
Glucose-Capillary: 162 mg/dL — ABNORMAL HIGH (ref 70–99)
Glucose-Capillary: 192 mg/dL — ABNORMAL HIGH (ref 70–99)

## 2020-11-27 MED ORDER — POTASSIUM CL IN DEXTROSE 5% 20 MEQ/L IV SOLN
20.0000 meq | INTRAVENOUS | Status: DC
  Administered 2020-11-27 – 2020-11-30 (×4): 20 meq via INTRAVENOUS
  Filled 2020-11-27 (×5): qty 1000

## 2020-11-27 MED ORDER — POTASSIUM CHLORIDE 10 MEQ/100ML IV SOLN
10.0000 meq | INTRAVENOUS | Status: AC
  Administered 2020-11-27 (×5): 10 meq via INTRAVENOUS
  Filled 2020-11-27 (×5): qty 100

## 2020-11-27 MED ORDER — INSULIN ASPART 100 UNIT/ML IJ SOLN
1.0000 [IU] | Freq: Once | INTRAMUSCULAR | Status: AC
  Administered 2020-11-27: 1 [IU] via SUBCUTANEOUS

## 2020-11-27 NOTE — Plan of Care (Signed)
  Problem: Coping: Goal: Level of anxiety will decrease Outcome: Progressing   Problem: Elimination: Goal: Will not experience complications related to bowel motility Outcome: Progressing   Problem: Pain Managment: Goal: General experience of comfort will improve Outcome: Progressing   Problem: Skin Integrity: Goal: Risk for impaired skin integrity will decrease Outcome: Progressing   

## 2020-11-27 NOTE — Progress Notes (Signed)
[facility] PROGRESS NOTE  [name] ENI:[id number] DOB: 1960/03/05 DOA: 11/18/2020 PCP: [name], NP   LOS: 8 days   Brief Narrative / Interim history: Patient noted to have profound [name] is a 60 y.o. male with medical history significant for HFpEF, hypertension, CVA, type 2 diabetes with neuropathy, CKD stage IIIa, polysubstance abuse, chronic pain, depression and hyperlipidemia who presents concerns of worsening abdominal pain.  Found to have significant gastric distention in the colon on imaging, general surgery and GI following.  Of note patient has had bowel decompression in 2021 with Dr. Alessandra Bevels.  Subjective / 24h Interval events: He is hungry today, he is sick and tired of clear liquid diet.  Assessment & Plan: Principal Problem Ogilvie syndrome/colonic pseudo-obstruction, stable -Patient was admitted to the hospital with Ogilvie syndrome.  Gastroenterology consulted and following patient.  Due to abdominal distention, nausea, he had an NG tube placed.  He underwent a colonoscopy with decompression 11/19/2020 with gastroenterology with minimal improvement.  He also had a rectal tube placed on 9/30 as well as placed on MiraLAX.  General surgery consulted but did not recommend any surgical intervention at this point.  With conservative management he appears to be slowly improving, has had increased output in his rectal tube.  Repeat abdominal x-ray today, defer to GI diet advancement / clamping / removal of NG tube.  Active Problems Hypokalemia, ongoing in the setting of poor p.o. intake -Continue repletion today  Hypernatremia -Due to NG tube losses.  He is on D5W with K, now slightly Hyponatremic, decrease the rate of fluids  AKI on CKD stage IIIa, improving -Creatinine improving with IV fluids, baseline of around 1.2    Hypertension -Remains on the high side, but overall better than few days ago   HFpEF, not in acute exacerbation -Last echocardiogram 07/2018  with EF of 65 to 70% and grade 2 diastolic dysfunction with small pericardial effusion.  Mild aortic valve stenosis.   Type 2 diabetes -Placed on sensitive sliding scale while n.p.o.   History of CAD -Continue aspirin, statin when able to tolerate p.o. resume Coreg today, potentially will not absorb it but give it a try due to persistently high blood pressure   Depression -Continue sertraline when able tolerate p.o.    BPH -Continue tamsulosin when able to take p.o.  Scheduled Meds:  carvedilol  12.5 mg Oral BID WC   chlorhexidine  15 mL Mouth Rinse BID   Chlorhexidine Gluconate Cloth  6 each Topical Daily   cloNIDine  0.3 mg Transdermal Weekly   enoxaparin (LOVENOX) injection  60 mg Subcutaneous Q24H   insulin aspart  0-6 Units Subcutaneous TID WC   mouth rinse  15 mL Mouth Rinse q12n4p   polyethylene glycol  17 g Oral BID   Continuous Infusions:  sodium chloride     dextrose 5 % with KCl 20 mEq / L 50 mL/hr at 11/27/20 1007   potassium chloride 10 mEq (11/27/20 1127)   PRN Meds:.acetaminophen, hydrALAZINE, labetalol, lip balm, ondansetron (ZOFRAN) IV, phenol  Diet Orders (From admission, onward)     Start     Ordered   11/20/20 1349  Diet clear liquid Room service appropriate? Yes; Fluid consistency: Thin  Diet effective now       Comments: Sips clear liquids  Question Answer Comment  Room service appropriate? Yes   Fluid consistency: Thin      11/20/20 1348            DVT  prophylaxis: SCDs Start: 11/18/20 2040     Code Status: Full Code  Family Communication: no family at bedside   Status is: Inpatient  Remains inpatient appropriate because:Inpatient level of care appropriate due to severity of illness  Dispo: The patient is from: Home              Anticipated d/c is to: Home              Patient currently is not medically stable to d/c.   Difficult to place patient No  Level of care: Med-Surg  Consultants:  GI Surgery   Procedures:   none  Microbiology  none  Antimicrobials: none    Objective: Vitals:   11/26/20 1956 11/27/20 0059 11/27/20 0619 11/27/20 1053  BP: (!) 144/75 (!) 173/94 (!) 195/115 (!) 171/102  Pulse: 74 78 77 77  Resp: 20 20 14    Temp: 99 F (37.2 C) 98.7 F (37.1 C) 98.5 F (36.9 C)   TempSrc: Oral     SpO2: 94% 93% 95% 100%  Weight:      Height:        Intake/Output Summary (Last 24 hours) at 11/27/2020 1223 Last data filed at 11/27/2020 1012 Gross per 24 hour  Intake 3746.84 ml  Output 3800 ml  Net -53.16 ml    Filed Weights   11/19/20 1626  Weight: 127.8 kg    Examination:  Constitutional: No distress, comfortable Eyes: Anicteric ENMT: mmm Neck: normal, supple Respiratory: Clear to auscultation bilaterally, no wheezing Cardiovascular: Regular rate and rhythm, trace edema Abdomen: Minimally distended, no tenderness, no guarding or rebound.  Distal bowel sounds Musculoskeletal: no clubbing / cyanosis.  Skin: No rashes seen Neurologic: Nonfocal, equal strength  Data Reviewed: I have independently reviewed following labs and imaging studies   CBC: Recent Labs  Lab 11/23/20 1250 11/24/20 0731 11/25/20 0402 11/26/20 0407 11/27/20 0345  WBC 5.3 4.9 5.4 4.8 5.1  HGB 10.9* 10.7* 9.4* 9.8* 9.2*  HCT 35.6* 35.8* 31.2* 31.5* 29.0*  MCV 81.1 82.9 82.8 81.4 79.9*  PLT 184 183 164 168 149*    Basic Metabolic Panel: Recent Labs  Lab 11/21/20 0617 11/22/20 0403 11/23/20 1250 11/24/20 0731 11/25/20 0402 11/26/20 0407 11/27/20 0345  NA 149*   < > 150* 158* 144 141 132*  K 3.2*   < > 3.4* 3.5 3.0* 3.2* 3.3*  CL 112*   < > 111 116* 105 99 95*  CO2 25   < > 28 33* 31 32 30  GLUCOSE 185*   < > 182* 182* 196* 231* 144*  BUN 41*   < > 38* 38* 37* 32* 30*  CREATININE 1.58*   < > 1.70* 1.69* 1.86* 1.54* 1.58*  CALCIUM 9.3   < > 8.8* 9.1 8.2* 8.5* 7.9*  MG 2.2  --  2.4  --   --  2.0  --   PHOS  --   --   --   --   --  2.8  --    < > = values in this interval not  displayed.    Liver Function Tests: No results for input(s): AST, ALT, ALKPHOS, BILITOT, PROT, ALBUMIN in the last 168 hours.  Coagulation Profile: No results for input(s): INR, PROTIME in the last 168 hours. HbA1C: No results for input(s): HGBA1C in the last 72 hours. CBG: Recent Labs  Lab 11/26/20 1425 11/26/20 1608 11/26/20 1954 11/27/20 0746 11/27/20 1155  GLUCAP 203* 164* 148* 157* 209*  Recent Results (from the past 240 hour(s))  Resp Panel by RT-PCR (Flu A&B, Covid) Nasopharyngeal Swab     Status: None   Collection Time: 11/17/20  6:15 PM   Specimen: Nasopharyngeal Swab; Nasopharyngeal(NP) swabs in vial transport medium  Result Value Ref Range Status   SARS Coronavirus 2 by RT PCR NEGATIVE NEGATIVE Final    Comment: (NOTE) SARS-CoV-2 target nucleic acids are NOT DETECTED.  The SARS-CoV-2 RNA is generally detectable in upper respiratory specimens during the acute phase of infection. The lowest concentration of SARS-CoV-2 viral copies this assay can detect is 138 copies/mL. A negative result does not preclude SARS-Cov-2 infection and should not be used as the sole basis for treatment or other patient management decisions. A negative result may occur with  improper specimen collection/handling, submission of specimen other than nasopharyngeal swab, presence of viral mutation(s) within the areas targeted by this assay, and inadequate number of viral copies(<138 copies/mL). A negative result must be combined with clinical observations, patient history, and epidemiological information. The expected result is Negative.  Fact Sheet for Patients:  EntrepreneurPulse.com.au  Fact Sheet for Healthcare Providers:  IncredibleEmployment.be  This test is no t yet approved or cleared by the [place] FDA and  has been authorized for detection and/or diagnosis of SARS-CoV-2 by FDA under an Emergency Use Authorization (EUA). This EUA  will remain  in effect (meaning this test can be used) for the duration of the COVID-19 declaration under Section 564(b)(1) of the Act, 21 U.S.C.section 360bbb-3(b)(1), unless the authorization is terminated  or revoked sooner.       Influenza A by PCR NEGATIVE NEGATIVE Final   Influenza B by PCR NEGATIVE NEGATIVE Final    Comment: (NOTE) The Xpert Xpress SARS-CoV-2/FLU/RSV plus assay is intended as an aid in the diagnosis of influenza from Nasopharyngeal swab specimens and should not be used as a sole basis for treatment. Nasal washings and aspirates are unacceptable for Xpert Xpress SARS-CoV-2/FLU/RSV testing.  Fact Sheet for Patients: EntrepreneurPulse.com.au  Fact Sheet for Healthcare Providers: IncredibleEmployment.be  This test is not yet approved or cleared by the [place] FDA and has been authorized for detection and/or diagnosis of SARS-CoV-2 by FDA under an Emergency Use Authorization (EUA). This EUA will remain in effect (meaning this test can be used) for the duration of the COVID-19 declaration under Section 564(b)(1) of the Act, 21 U.S.C. section 360bbb-3(b)(1), unless the authorization is terminated or revoked.  Performed at [facility], [place] [address] [place], [place] [address]   Resp Panel by RT-PCR (Flu A&B, Covid) Nasopharyngeal Swab     Status: None   Collection Time: 11/18/20  8:23 PM   Specimen: Nasopharyngeal Swab; Nasopharyngeal(NP) swabs in vial transport medium  Result Value Ref Range Status   SARS Coronavirus 2 by RT PCR NEGATIVE NEGATIVE Final    Comment: (NOTE) SARS-CoV-2 target nucleic acids are NOT DETECTED.  The SARS-CoV-2 RNA is generally detectable in upper respiratory specimens during the acute phase of infection. The lowest concentration of SARS-CoV-2 viral copies this assay can detect is 138 copies/mL. A negative result does not preclude SARS-Cov-2 infection and should not  be used as the sole basis for treatment or other patient management decisions. A negative result may occur with  improper specimen collection/handling, submission of specimen other than nasopharyngeal swab, presence of viral mutation(s) within the areas targeted by this assay, and inadequate number of viral copies(<138 copies/mL). A negative result must be combined with clinical observations, patient history, and  epidemiological information. The expected result is Negative.  Fact Sheet for Patients:  EntrepreneurPulse.com.au  Fact Sheet for Healthcare Providers:  IncredibleEmployment.be  This test is no t yet approved or cleared by the [place] FDA and  has been authorized for detection and/or diagnosis of SARS-CoV-2 by FDA under an Emergency Use Authorization (EUA). This EUA will remain  in effect (meaning this test can be used) for the duration of the COVID-19 declaration under Section 564(b)(1) of the Act, 21 U.S.C.section 360bbb-3(b)(1), unless the authorization is terminated  or revoked sooner.       Influenza A by PCR NEGATIVE NEGATIVE Final   Influenza B by PCR NEGATIVE NEGATIVE Final    Comment: (NOTE) The Xpert Xpress SARS-CoV-2/FLU/RSV plus assay is intended as an aid in the diagnosis of influenza from Nasopharyngeal swab specimens and should not be used as a sole basis for treatment. Nasal washings and aspirates are unacceptable for Xpert Xpress SARS-CoV-2/FLU/RSV testing.  Fact Sheet for Patients: EntrepreneurPulse.com.au  Fact Sheet for Healthcare Providers: IncredibleEmployment.be  This test is not yet approved or cleared by the [place] FDA and has been authorized for detection and/or diagnosis of SARS-CoV-2 by FDA under an Emergency Use Authorization (EUA). This EUA will remain in effect (meaning this test can be used) for the duration of the COVID-19 declaration under Section  564(b)(1) of the Act, 21 U.S.C. section 360bbb-3(b)(1), unless the authorization is terminated or revoked.  Performed at [facility], [place] [address] [place], [place] [address]       Radiology Studies: No results found.  Marzetta Board, MD, PhD [facility]  Between 7 am - 7 pm I am available, please contact me via Amion (for emergencies) or Securechat (non urgent messages)  Between 7 pm - 7 am I am not available, please contact night coverage MD/APP via Amion

## 2020-11-27 NOTE — Progress Notes (Signed)
[facility] Progress Note  [name] 60 y.o. 1960/06/23  CC:  Carita Pian syndrome/colonic pseudo-obstruction   Subjective: Patient states he is feeling better today than he was yesterday. States he is "starving" and wants food, not just clear liquids. Reports minimal amount of abdominal pain, with no nausea/vomiting. Per flowsheet, has had two Bms via rectal tube this morning.  ROS : Review of Systems  Cardiovascular:  Negative for chest pain and palpitations.  Gastrointestinal:  Positive for abdominal pain and constipation. Negative for blood in stool, diarrhea, heartburn, melena, nausea and vomiting.     Objective: Vital signs in last 24 hours: Vitals:   11/27/20 0059 11/27/20 0619  BP: (!) 173/94 (!) 195/115  Pulse: 78 77  Resp: 20 14  Temp: 98.7 F (37.1 C) 98.5 F (36.9 C)  SpO2: 93% 95%    Physical Exam:  General:  Alert, cooperative, no distress, appears stated age  Head:  Normocephalic, without obvious abnormality, atraumatic  Eyes:  Anicteric sclera, EOM's intact  Lungs:   Clear to auscultation bilaterally, respirations unlabored  Heart:  Regular rate and rhythm, S1, S2 normal  Abdomen:   Abdominal distention with hypoactive bowel sounds. Mild tenderness to diffuse palpation    Lab Results: Recent Labs    11/26/20 0407 11/27/20 0345  NA 141 132*  K 3.2* 3.3*  CL 99 95*  CO2 32 30  GLUCOSE 231* 144*  BUN 32* 30*  CREATININE 1.54* 1.58*  CALCIUM 8.5* 7.9*  MG 2.0  --   PHOS 2.8  --    No results for input(s): AST, ALT, ALKPHOS, BILITOT, PROT, ALBUMIN in the last 72 hours. Recent Labs    11/26/20 0407 11/27/20 0345  WBC 4.8 5.1  HGB 9.8* 9.2*  HCT 31.5* 29.0*  MCV 81.4 79.9*  PLT 168 149*   No results for input(s): LABPROT, INR in the last 72 hours.    Assessment Ogilvie syndrome/colonic pseudo-obstruction: s/p colonic decompression 9/25 with persistent/recurrent of symptoms -Repeat abdominal x-ray 10/1: Continued diffuse gaseous  distention of the bowel, unchanged. Stable NG tube, terminating in the stomach. - Magnesium 2.0, Phosphorus 2.8 10/2 - Potassium 3.3 (improved from 3.0 10/1)  Anemia - HGB 9.2 (decreased from 9.8 yesterday) - BUN 30 (improved from 37 10/1), Cr 1.58  Plan: Continue to maintain magnesium above 2 and potassium at 4-4.5.  Champ NG tube in place. Dulcolax suppository   Continue clear liquid diet  Will get repeat abdominal xray, if continued distention will keep NG tube in place. If improvement, can remove NG tube and possible advance diet as tolerated.  [facility] will follow  Garnette Scheuermann PA-C 11/27/2020, 8:21 AM  Contact #  [phone]

## 2020-11-28 ENCOUNTER — Inpatient Hospital Stay (HOSPITAL_COMMUNITY): Payer: Medicare HMO

## 2020-11-28 DIAGNOSIS — R1084 Generalized abdominal pain: Secondary | ICD-10-CM | POA: Diagnosis not present

## 2020-11-28 DIAGNOSIS — N179 Acute kidney failure, unspecified: Secondary | ICD-10-CM | POA: Diagnosis not present

## 2020-11-28 DIAGNOSIS — N1831 Chronic kidney disease, stage 3a: Secondary | ICD-10-CM | POA: Diagnosis not present

## 2020-11-28 LAB — CBC
HCT: 29.3 % — ABNORMAL LOW (ref 39.0–52.0)
Hemoglobin: 9.3 g/dL — ABNORMAL LOW (ref 13.0–17.0)
MCH: 25.1 pg — ABNORMAL LOW (ref 26.0–34.0)
MCHC: 31.7 g/dL (ref 30.0–36.0)
MCV: 79 fL — ABNORMAL LOW (ref 80.0–100.0)
Platelets: 152 10*3/uL (ref 150–400)
RBC: 3.71 MIL/uL — ABNORMAL LOW (ref 4.22–5.81)
RDW: 16.5 % — ABNORMAL HIGH (ref 11.5–15.5)
WBC: 4.4 10*3/uL (ref 4.0–10.5)
nRBC: 0 % (ref 0.0–0.2)

## 2020-11-28 LAB — BASIC METABOLIC PANEL
Anion gap: 7 (ref 5–15)
BUN: 27 mg/dL — ABNORMAL HIGH (ref 6–20)
CO2: 29 mmol/L (ref 22–32)
Calcium: 8.3 mg/dL — ABNORMAL LOW (ref 8.9–10.3)
Chloride: 101 mmol/L (ref 98–111)
Creatinine, Ser: 1.34 mg/dL — ABNORMAL HIGH (ref 0.61–1.24)
GFR, Estimated: >60 mL/min (ref >60)
Glucose, Bld: 175 mg/dL — ABNORMAL HIGH (ref 70–99)
Potassium: 3.8 mmol/L (ref 3.5–5.1)
Sodium: 137 mmol/L (ref 135–145)

## 2020-11-28 LAB — GLUCOSE, CAPILLARY
Glucose-Capillary: 144 mg/dL — ABNORMAL HIGH (ref 70–99)
Glucose-Capillary: 226 mg/dL — ABNORMAL HIGH (ref 70–99)
Glucose-Capillary: 147 mg/dL — ABNORMAL HIGH (ref 70–99)
Glucose-Capillary: 173 mg/dL — ABNORMAL HIGH (ref 70–99)

## 2020-11-28 MED ORDER — ASPIRIN 81 MG PO CHEW
81.0000 mg | CHEWABLE_TABLET | Freq: Every day | ORAL | Status: DC
  Administered 2020-11-28 – 2020-12-11 (×13): 81 mg via ORAL
  Filled 2020-11-28 (×13): qty 1

## 2020-11-28 MED ORDER — ATORVASTATIN CALCIUM 40 MG PO TABS
80.0000 mg | ORAL_TABLET | Freq: Every day | ORAL | Status: DC
  Administered 2020-11-28 – 2020-12-11 (×13): 80 mg via ORAL
  Filled 2020-11-28 (×13): qty 2

## 2020-11-28 MED ORDER — AMLODIPINE BESYLATE 10 MG PO TABS
10.0000 mg | ORAL_TABLET | Freq: Every day | ORAL | Status: DC
  Administered 2020-11-28 – 2020-12-11 (×12): 10 mg via ORAL
  Filled 2020-11-28 (×12): qty 1

## 2020-11-28 MED ORDER — INSULIN GLARGINE-YFGN 100 UNIT/ML ~~LOC~~ SOLN
5.0000 [IU] | Freq: Every day | SUBCUTANEOUS | Status: DC
  Administered 2020-11-28 – 2020-12-11 (×14): 5 [IU] via SUBCUTANEOUS
  Filled 2020-11-28 (×14): qty 0.05

## 2020-11-28 MED ORDER — EMPAGLIFLOZIN 10 MG PO TABS
10.0000 mg | ORAL_TABLET | Freq: Every day | ORAL | Status: DC
  Administered 2020-11-28 – 2020-12-11 (×13): 10 mg via ORAL
  Filled 2020-11-28 (×14): qty 1

## 2020-11-28 MED ORDER — SERTRALINE HCL 50 MG PO TABS
50.0000 mg | ORAL_TABLET | Freq: Every day | ORAL | Status: DC
  Administered 2020-11-28 – 2020-12-11 (×13): 50 mg via ORAL
  Filled 2020-11-28 (×13): qty 1

## 2020-11-28 MED ORDER — KETOROLAC TROMETHAMINE 15 MG/ML IJ SOLN
15.0000 mg | Freq: Once | INTRAMUSCULAR | Status: AC
  Administered 2020-11-28: 15 mg via INTRAVENOUS
  Filled 2020-11-28: qty 1

## 2020-11-28 MED ORDER — ISOSORB DINITRATE-HYDRALAZINE 20-37.5 MG PO TABS
1.0000 | ORAL_TABLET | Freq: Three times a day (TID) | ORAL | Status: DC
  Administered 2020-11-28 – 2020-12-06 (×25): 1 via ORAL
  Filled 2020-11-28 (×30): qty 1

## 2020-11-28 MED ORDER — TAMSULOSIN HCL 0.4 MG PO CAPS
0.8000 mg | ORAL_CAPSULE | Freq: Every day | ORAL | Status: DC
  Administered 2020-11-28 – 2020-12-11 (×13): 0.8 mg via ORAL
  Filled 2020-11-28 (×13): qty 2

## 2020-11-28 NOTE — Progress Notes (Signed)
[facility] PROGRESS NOTE  [name] ZOX:[id number] DOB: 04-05-1960 DOA: 11/18/2020 PCP: [name], NP   LOS: 9 days   Brief Narrative / Interim history: Patient noted to have profound [name] is a 60 y.o. male with medical history significant for HFpEF, hypertension, CVA, type 2 diabetes with neuropathy, CKD stage IIIa, polysubstance abuse, chronic pain, depression and hyperlipidemia who presents concerns of worsening abdominal pain.  Found to have significant gastric distention in the colon on imaging, general surgery and GI following.  Of note patient has had bowel decompression in 2021 with Dr. Alessandra Bevels.  He had decompression on 9/25 without significant improvement.  GI also recommended an NG tube on 9/25 which was eventually removed on 9/4.  He is very slow to improve, currently has a rectal tube in place, on MiraLAX  Subjective / 24h Interval events: Remains hungry, wants to eat solid food  Assessment & Plan: Principal Problem Ogilvie syndrome/colonic pseudo-obstruction, stable -Patient was admitted to the hospital with Ogilvie syndrome.  Gastroenterology consulted and following patient.  Due to abdominal distention, nausea, he had an NG tube placed.  He underwent a colonoscopy with decompression 11/19/2020 with minimal improvement.  He also had a rectal tube placed on 9/30 as well as placed on MiraLAX.  General surgery consulted but did not recommend any surgical intervention at this point.  With conservative management he appears to be slowly improving, has had increased output in his rectal tube.  Repeat abdominal x-ray without improvement but clinically he looks better.  Discontinue NG tube today, allow a full liquid diet  Active Problems Hypokalemia, ongoing in the setting of poor p.o. intake -continue to replete  Hypernatremia -Now improving, on D5W with potassium until p.o. intake is consistent  AKI on CKD stage IIIa, improving -Creatinine improving with IV fluids,  baseline of around 1.2, currently at 1.3   Hypertension -Remains on the high side, but overall better than few days ago.  He was placed on clonidine patch when he was strict n.p.o. and had the NG tube in place.  Now that the NG tube has been removed, resume carvedilol, amlodipine, BiDil.  Monitor blood pressure over the next day, and discontinue clonidine and resume Entresto if possible.  Continue to hold home torsemide for now   HFpEF, not in acute exacerbation -Last echocardiogram 07/2018 with EF of 65 to 70% and grade 2 diastolic dysfunction with small pericardial effusion.  Mild aortic valve stenosis. -On Coreg, BiDil, Norvasc for now.  Entresto to be resumed in the next day or so along with discontinuation of clonidine if blood pressure allows and he is able to consistently take p.o.   Type 2 diabetes -Continue sliding scale, his CBGs were okay when he was n.p.o. but now starting to rise, add back his home Lantus at a lower dose.  Resume Jardiance  CBG (last 3)  Recent Labs    11/27/20 2148 11/28/20 0737 11/28/20 1143  GLUCAP 192* 144* 226*    History of CAD -Resuming home meds today due to NG tube removal   Depression -Resume Zoloft today   BPH -Resume tamsulosin today, give a voiding trial today as well as he had retention on admission  Scheduled Meds:  amLODipine  10 mg Oral Daily   aspirin  81 mg Oral Daily   atorvastatin  80 mg Oral QHS   carvedilol  12.5 mg Oral BID WC   chlorhexidine  15 mL Mouth Rinse BID   Chlorhexidine Gluconate Cloth  6  each Topical Daily   cloNIDine  0.3 mg Transdermal Weekly   empagliflozin  10 mg Oral Daily   enoxaparin (LOVENOX) injection  60 mg Subcutaneous Q24H   insulin aspart  0-6 Units Subcutaneous TID WC   insulin glargine-yfgn  5 Units Subcutaneous Daily   isosorbide-hydrALAZINE  1 tablet Oral TID   mouth rinse  15 mL Mouth Rinse q12n4p   polyethylene glycol  17 g Oral BID   tamsulosin  0.8 mg Oral Daily   Continuous  Infusions:  sodium chloride     dextrose 5 % with KCl 20 mEq / L 20 mEq (11/27/20 2050)   PRN Meds:.acetaminophen, hydrALAZINE, labetalol, lip balm, ondansetron (ZOFRAN) IV, phenol  Diet Orders (From admission, onward)     Start     Ordered   11/27/20 1316  Diet full liquid Room service appropriate? Yes; Fluid consistency: Thin  Diet effective now       Question Answer Comment  Room service appropriate? Yes   Fluid consistency: Thin      11/27/20 1316            DVT prophylaxis: SCDs Start: 11/18/20 2040     Code Status: Full Code  Family Communication: no family at bedside   Status is: Inpatient  Remains inpatient appropriate because:Inpatient level of care appropriate due to severity of illness  Dispo: The patient is from: Home              Anticipated d/c is to: Home              Patient currently is not medically stable to d/c.   Difficult to place patient No  Level of care: Med-Surg  Consultants:  GI Surgery   Procedures:  none  Microbiology  none  Antimicrobials: none    Objective: Vitals:   11/28/20 0548 11/28/20 0950 11/28/20 1115 11/28/20 1244  BP: (!) 170/84 (!) 184/99 128/71 (!) 150/88  Pulse: 77 79 81 82  Resp: 20 16  18   Temp: 98.8 F (37.1 C) 98.8 F (37.1 C)  97.9 F (36.6 C)  TempSrc: Oral Oral  Oral  SpO2: 100% 100%  100%  Weight:      Height:        Intake/Output Summary (Last 24 hours) at 11/28/2020 1348 Last data filed at 11/28/2020 0600 Gross per 24 hour  Intake 1931.25 ml  Output 2075 ml  Net -143.75 ml    Filed Weights   11/19/20 1626  Weight: 127.8 kg    Examination:  Constitutional: No distress, comfortable Eyes: Anicteric ENMT: mmm Neck: normal, supple Respiratory: Clear bilaterally, no wheezing Cardiovascular: Regular rate and rhythm, trace edema Abdomen: Some distention noted soft, no guarding, no rebound, no tenderness, bowel sounds present Musculoskeletal: no clubbing / cyanosis.  Skin: No rashes  seen Neurologic: No focal deficits  Data Reviewed: I have independently reviewed following labs and imaging studies   CBC: Recent Labs  Lab 11/24/20 0731 11/25/20 0402 11/26/20 0407 11/27/20 0345 11/28/20 0353  WBC 4.9 5.4 4.8 5.1 4.4  HGB 10.7* 9.4* 9.8* 9.2* 9.3*  HCT 35.8* 31.2* 31.5* 29.0* 29.3*  MCV 82.9 82.8 81.4 79.9* 79.0*  PLT 183 164 168 149* 253    Basic Metabolic Panel: Recent Labs  Lab 11/23/20 1250 11/24/20 0731 11/25/20 0402 11/26/20 0407 11/27/20 0345 11/28/20 0353  NA 150* 158* 144 141 132* 137  K 3.4* 3.5 3.0* 3.2* 3.3* 3.8  CL 111 116* 105 99 95* 101  CO2 28 33* 31  32 30 29  GLUCOSE 182* 182* 196* 231* 144* 175*  BUN 38* 38* 37* 32* 30* 27*  CREATININE 1.70* 1.69* 1.86* 1.54* 1.58* 1.34*  CALCIUM 8.8* 9.1 8.2* 8.5* 7.9* 8.3*  MG 2.4  --   --  2.0  --   --   PHOS  --   --   --  2.8  --   --     Liver Function Tests: No results for input(s): AST, ALT, ALKPHOS, BILITOT, PROT, ALBUMIN in the last 168 hours.  Coagulation Profile: No results for input(s): INR, PROTIME in the last 168 hours. HbA1C: No results for input(s): HGBA1C in the last 72 hours. CBG: Recent Labs  Lab 11/27/20 1716 11/27/20 1734 11/27/20 2148 11/28/20 0737 11/28/20 1143  GLUCAP 155* 162* 192* 144* 226*     Recent Results (from the past 240 hour(s))  Resp Panel by RT-PCR (Flu A&B, Covid) Nasopharyngeal Swab     Status: None   Collection Time: 11/18/20  8:23 PM   Specimen: Nasopharyngeal Swab; Nasopharyngeal(NP) swabs in vial transport medium  Result Value Ref Range Status   SARS Coronavirus 2 by RT PCR NEGATIVE NEGATIVE Final    Comment: (NOTE) SARS-CoV-2 target nucleic acids are NOT DETECTED.  The SARS-CoV-2 RNA is generally detectable in upper respiratory specimens during the acute phase of infection. The lowest concentration of SARS-CoV-2 viral copies this assay can detect is 138 copies/mL. A negative result does not preclude SARS-Cov-2 infection and should  not be used as the sole basis for treatment or other patient management decisions. A negative result may occur with  improper specimen collection/handling, submission of specimen other than nasopharyngeal swab, presence of viral mutation(s) within the areas targeted by this assay, and inadequate number of viral copies(<138 copies/mL). A negative result must be combined with clinical observations, patient history, and epidemiological information. The expected result is Negative.  Fact Sheet for Patients:  EntrepreneurPulse.com.au  Fact Sheet for Healthcare Providers:  IncredibleEmployment.be  This test is no t yet approved or cleared by the [place] FDA and  has been authorized for detection and/or diagnosis of SARS-CoV-2 by FDA under an Emergency Use Authorization (EUA). This EUA will remain  in effect (meaning this test can be used) for the duration of the COVID-19 declaration under Section 564(b)(1) of the Act, 21 U.S.C.section 360bbb-3(b)(1), unless the authorization is terminated  or revoked sooner.       Influenza A by PCR NEGATIVE NEGATIVE Final   Influenza B by PCR NEGATIVE NEGATIVE Final    Comment: (NOTE) The Xpert Xpress SARS-CoV-2/FLU/RSV plus assay is intended as an aid in the diagnosis of influenza from Nasopharyngeal swab specimens and should not be used as a sole basis for treatment. Nasal washings and aspirates are unacceptable for Xpert Xpress SARS-CoV-2/FLU/RSV testing.  Fact Sheet for Patients: EntrepreneurPulse.com.au  Fact Sheet for Healthcare Providers: IncredibleEmployment.be  This test is not yet approved or cleared by the [place] FDA and has been authorized for detection and/or diagnosis of SARS-CoV-2 by FDA under an Emergency Use Authorization (EUA). This EUA will remain in effect (meaning this test can be used) for the duration of the COVID-19 declaration under  Section 564(b)(1) of the Act, 21 U.S.C. section 360bbb-3(b)(1), unless the authorization is terminated or revoked.  Performed at [facility], [place] [address] [place],  [address]       Radiology Studies: DG Abd 1 View  Result Date: 11/28/2020 CLINICAL DATA:  Follow-up ileus. EXAM: ABDOMEN - 1 VIEW  COMPARISON:  11/27/2020 and older studies. FINDINGS: Gaseous distention of colon appears increased from the previous day's exam, no current evidence of small bowel dilation. Nasal/orogastric tube is stable, tip projecting in the left upper quadrant. IMPRESSION: 1. Mild interval increase in gaseous distention of the colon compared to previous day's study. Electronically Signed   By: Lajean Manes M.D.   On: 11/28/2020 08:19    Marzetta Board, MD, PhD [facility]  Between 7 am - 7 pm I am available, please contact me via Amion (for emergencies) or Securechat (non urgent messages)  Between 7 pm - 7 am I am not available, please contact night coverage MD/APP via Amion

## 2020-11-28 NOTE — TOC Initial Note (Signed)
Transition of Care [facility]) - Initial/Assessment Note    Patient Details  Name: [name] MRN: [id number] Date of Birth: [birth year]  Transition of Care [facility]) CM/SW Contact:    Leeroy Cha, RN Phone Number: 11/28/2020, 9:24 AM  Clinical Narrative:                 60 y.o. male with medical history significant for HFpEF, hypertension, CVA, type 2 diabetes with neuropathy, CKD stage IIIa, polysubstance abuse, chronic pain, depression and hyperlipidemia who presents concerns of worsening abdominal pain.  Found to have significant gastric distention in the colon on imaging, general surgery and GI following.  Of note patient has had bowel decompression in 2021 with Dr. Alessandra Bevels.   Subjective / 24h Interval events: He is hungry today, he is sick and tired of clear liquid diet.   Assessment & Plan: Principal Problem Ogilvie syndrome/colonic pseudo-obstruction, stable -Patient was admitted to the hospital with Ogilvie syndrome.  Gastroenterology consulted and following patient.  Due to abdominal distention, nausea, he had an NG tube placed.  He underwent a colonoscopy with decompression 11/19/2020 with gastroenterology with minimal improvement.  He also had a rectal tube placed on 9/30 as well as placed on MiraLAX.  General surgery consulted but did not recommend any surgical intervention at this point.  With conservative management he appears to be slowly improving, has had increased output in his rectal tube.  Repeat abdominal x-ray today, defer to GI diet advancement / clamping / removal of NG tube. TOC PLAN OF CARE: following for progression and toc needs. Expected Discharge Plan: Home/Self Care Barriers to Discharge: Continued Medical Work up   Patient Goals and CMS Choice Patient states their goals for this hospitalization and ongoing recovery are:: to go home CMS Medicare.gov Compare Post Acute Care list provided to:: Patient    Expected Discharge Plan and Services Expected  Discharge Plan: Home/Self Care   Discharge Planning Services: CM Consult   Living arrangements for the past 2 months: Single Family Home                                      Prior Living Arrangements/Services Living arrangements for the past 2 months: Single Family Home Lives with:: Self Patient language and need for interpreter reviewed:: Yes Do you feel safe going back to the place where you live?: Yes            Criminal Activity/Legal Involvement Pertinent to Current Situation/Hospitalization: No - Comment as needed  Activities of Daily Living Home Assistive Devices/Equipment: None ADL Screening (condition at time of admission) Patient's cognitive ability adequate to safely complete daily activities?: Yes Is the patient deaf or have difficulty hearing?: No Does the patient have difficulty seeing, even when wearing glasses/contacts?: No Does the patient have difficulty concentrating, remembering, or making decisions?: No Patient able to express need for assistance with ADLs?: Yes Does the patient have difficulty dressing or bathing?: No Independently performs ADLs?: No Communication: Independent Dressing (OT): Dependent Is this a change from baseline?: Pre-admission baseline Grooming: Dependent Is this a change from baseline?: Pre-admission baseline Feeding: Needs assistance Is this a change from baseline?: Pre-admission baseline Bathing: Dependent Is this a change from baseline?: Pre-admission baseline Toileting: Dependent Is this a change from baseline?: Pre-admission baseline In/Out Bed: Dependent Is this a change from baseline?: Pre-admission baseline Walks in Home: Dependent (chair bound) Is this a change from baseline?:  Pre-admission baseline Does the patient have difficulty walking or climbing stairs?: Yes Weakness of Legs: Both Weakness of Arms/Hands: Both  Permission Sought/Granted                  Emotional Assessment Appearance:: Appears  stated age     Orientation: : Oriented to Self, Oriented to Place, Oriented to  Time, Oriented to Situation Alcohol / Substance Use: Not Applicable Psych Involvement: No (comment)  Admission diagnosis:  Ileus ([place]) [K56.7] Ogilvie syndrome [K59.81] Patient Active Problem List   Diagnosis Date Noted   Pressure injury of skin 11/19/2020   Abdominal pain, generalized 11/19/2020   Ileus ([place]) 11/18/2020   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   Depression 11/18/2020   Acute-on-chronic kidney injury ([place]) 11/18/2020   BPH (benign prostatic hyperplasia) 11/18/2020   AKI (acute kidney injury) ([place]) 08/22/2020   CVA ([place]) with residual right-sided weakness 08/22/2020   Pericardial effusion 08/22/2020   Acute on chronic diastolic CHF (congestive heart failure) ([place]) 08/22/2020   TIA (transient ischemic attack) 07/03/2020   Acute on chronic heart failure with preserved ejection fraction ([place])    Pulmonary artery hypertension ([place]) 06/25/2020   CKD (chronic kidney disease) 06/25/2020   History of cerebrovascular accident (CVA) with residual deficit 06/25/2020   CKD stage IIIa ([place]) 03/30/2020   Morbid (severe) obesity due to excess calories ([place]) 03/30/2020   Malnutrition of moderate degree 01/25/2020   Palliative care by specialist    Goals of care, counseling/discussion    Nephrotic syndrome 01/06/2020   Anasarca 01/06/2020   Hypokalemia due to loss of potassium 01/04/2020   Generalized body aches 01/01/2020   Non-traumatic rhabdomyolysis 01/01/2020   Grade I diastolic dysfunction 19/41/7408   Microcytic anemia 01/01/2020   Hypokalemia 12/31/2019   Elevated CK    Chronic diastolic CHF (congestive heart failure) ([place]) 12/31/2017   Diabetes mellitus type 2, uncontrolled    Pure hypercholesterolemia    Hyperglycemia 09/28/2017   Nonobstructive atherosclerosis of coronary artery 09/28/2017   MDD (major depressive disorder), recurrent episode, severe ([place]) 07/30/2017   Osteoarthritis  04/12/2015   Severe recurrent major depression with psychotic features ([place]) 01/04/2015   Substance-related disorder ([place]) 10/30/2014   Patient's noncompliance with other medical treatment and regimen 06/07/2014   Cocaine dependence, uncomplicated ([place]) 14/48/1856   Chronic pain 05/31/2014   Cocaine abuse ([place]) 05/31/2014   History of noncompliance with medical treatment 05/31/2014   Malingering 05/31/2014   Leukopenia 05/31/2014   Diabetic neuropathy associated with diabetes mellitus due to underlying condition ([place]) 05/31/2014   Diabetic neuropathy ([place]) 05/31/2014   Congestive heart failure with left ventricular systolic dysfunction ([place]) 02/22/2014   Depression, major, recurrent, severe with psychosis ([place]) 08/29/2013   Suicidal ideations 08/28/2013   Uncontrolled diabetes mellitus 06/30/2013   Atypical chest pain 12/09/2011   Essential hypertension 12/09/2011   Episodic mood disorder ([place]) 05/13/2011   PCP:  [name], NP Pharmacy:   [id number] [place], [place] [address] [place] [address] Phone: [phone] Fax: [phone]  [facility] - [place], [place] - [address] [place] [address] Phone: [phone] Fax: [phone]     Social Determinants of Health (SDOH) Interventions    Readmission Risk Interventions Readmission Risk Prevention Plan 01/12/2020  Transportation Screening Complete  Palliative Care Screening Not Applicable  Some recent data might be hidden

## 2020-11-28 NOTE — Plan of Care (Signed)
  Problem: Clinical Measurements: Goal: Diagnostic test results will improve Outcome: Progressing   Problem: Nutrition: Goal: Adequate nutrition will be maintained Outcome: Progressing   Problem: Coping: Goal: Level of anxiety will decrease Outcome: Progressing   Problem: Pain Managment: Goal: General experience of comfort will improve Outcome: Progressing   Problem: Skin Integrity: Goal: Risk for impaired skin integrity will decrease Outcome: Progressing

## 2020-11-28 NOTE — Progress Notes (Signed)
[facility] Gastroenterology Progress Note  [name] 60 y.o. 1960/05/12  CC:  Carita Pian syndrome/colonic pseudo-obstruction   Subjective: Patient states his abdominal pain is much improved. States he has no pain today. Denies nausea, vomiting. States all he wants right now is a loaded hot dog or a grilled cheese. Has been tolerating full liquids well, had baked potato soup yesterday. States his throat has been a little sore and he has some right shoulder pain from a fall that occurred at the facility he lives in prior to hospitalization.  ROS : Review of Systems  HENT:  Positive for sore throat.   Cardiovascular:  Negative for chest pain and palpitations.  Gastrointestinal:  Positive for constipation. Negative for abdominal pain, blood in stool, diarrhea, heartburn, melena, nausea and vomiting.     Objective: Vital signs in last 24 hours: Vitals:   11/27/20 2152 11/28/20 0548  BP: (!) 157/78 (!) 170/84  Pulse: 73 77  Resp: 18 20  Temp: 98.7 F (37.1 C) 98.8 F (37.1 C)  SpO2: 99% 100%    Physical Exam:  General:  Alert, cooperative, no distress, appears stated age  Head:  Normocephalic, without obvious abnormality, atraumatic  Eyes:  Anicteric sclera, EOM's intact  Lungs:   Clear to auscultation bilaterally, respirations unlabored  Heart:  Regular rate and rhythm, S1, S2 normal  Abdomen:   Minimal distention, no tenderness, guarding, or rebound. Distal bowel sounds.    Lab Results: Recent Labs    11/26/20 0407 11/27/20 0345 11/28/20 0353  NA 141 132* 137  K 3.2* 3.3* 3.8  CL 99 95* 101  CO2 32 30 29  GLUCOSE 231* 144* 175*  BUN 32* 30* 27*  CREATININE 1.54* 1.58* 1.34*  CALCIUM 8.5* 7.9* 8.3*  MG 2.0  --   --   PHOS 2.8  --   --    No results for input(s): AST, ALT, ALKPHOS, BILITOT, PROT, ALBUMIN in the last 72 hours. Recent Labs    11/27/20 0345 11/28/20 0353  WBC 5.1 4.4  HGB 9.2* 9.3*  HCT 29.0* 29.3*  MCV 79.9* 79.0*  PLT 149* 152   No results for  input(s): LABPROT, INR in the last 72 hours.    Assessment Ogilvie syndrome/colonic pseudo-obstruction: s/p colonic decompression 9/25 with persistent/recurrent of symptoms -Repeat abdominal x-ray 10/3: Mild interval increase in gaseous distention of the colon compared to previous day's study. - Magnesium 2.0, Phosphorus 2.8 10/2 - Potassium 3.8 (improved from 3.3 yesterday)   Anemia - HGB 9.3  - BUN 27 down trending, Cr 1.34   Plan: Continue to maintain magnesium above 2 and potassium at 4-4.5.   Remove NG tube. Can continue full liquids as tolerated. No improvement in repeat abdominal xray.  Continue Miralax  [facility] will follow  Garnette Scheuermann PA-C 11/28/2020, 8:10 AM  Contact #  [phone]

## 2020-11-29 ENCOUNTER — Inpatient Hospital Stay (HOSPITAL_COMMUNITY): Payer: Medicare HMO

## 2020-11-29 DIAGNOSIS — N179 Acute kidney failure, unspecified: Secondary | ICD-10-CM | POA: Diagnosis not present

## 2020-11-29 DIAGNOSIS — N400 Enlarged prostate without lower urinary tract symptoms: Secondary | ICD-10-CM | POA: Diagnosis not present

## 2020-11-29 DIAGNOSIS — D509 Iron deficiency anemia, unspecified: Secondary | ICD-10-CM

## 2020-11-29 DIAGNOSIS — F32A Depression, unspecified: Secondary | ICD-10-CM | POA: Diagnosis not present

## 2020-11-29 DIAGNOSIS — R1084 Generalized abdominal pain: Secondary | ICD-10-CM | POA: Diagnosis not present

## 2020-11-29 LAB — COMPREHENSIVE METABOLIC PANEL
ALT: 28 U/L (ref 0–44)
AST: 35 U/L (ref 15–41)
Albumin: 2.6 g/dL — ABNORMAL LOW (ref 3.5–5.0)
Alkaline Phosphatase: 166 U/L — ABNORMAL HIGH (ref 38–126)
Anion gap: 8 (ref 5–15)
BUN: 22 mg/dL — ABNORMAL HIGH (ref 6–20)
CO2: 28 mmol/L (ref 22–32)
Calcium: 8.1 mg/dL — ABNORMAL LOW (ref 8.9–10.3)
Chloride: 101 mmol/L (ref 98–111)
Creatinine, Ser: 1.46 mg/dL — ABNORMAL HIGH (ref 0.61–1.24)
GFR, Estimated: 55 mL/min — ABNORMAL LOW (ref >60)
Glucose, Bld: 143 mg/dL — ABNORMAL HIGH (ref 70–99)
Potassium: 3.9 mmol/L (ref 3.5–5.1)
Sodium: 137 mmol/L (ref 135–145)
Total Bilirubin: 0.6 mg/dL (ref 0.3–1.2)
Total Protein: 6 g/dL — ABNORMAL LOW (ref 6.5–8.1)

## 2020-11-29 LAB — GLUCOSE, CAPILLARY
Glucose-Capillary: 132 mg/dL — ABNORMAL HIGH (ref 70–99)
Glucose-Capillary: 161 mg/dL — ABNORMAL HIGH (ref 70–99)
Glucose-Capillary: 168 mg/dL — ABNORMAL HIGH (ref 70–99)
Glucose-Capillary: 155 mg/dL — ABNORMAL HIGH (ref 70–99)

## 2020-11-29 LAB — CBC
HCT: 27.9 % — ABNORMAL LOW (ref 39.0–52.0)
Hemoglobin: 8.9 g/dL — ABNORMAL LOW (ref 13.0–17.0)
MCH: 25.4 pg — ABNORMAL LOW (ref 26.0–34.0)
MCHC: 31.9 g/dL (ref 30.0–36.0)
MCV: 79.7 fL — ABNORMAL LOW (ref 80.0–100.0)
Platelets: 146 10*3/uL — ABNORMAL LOW (ref 150–400)
RBC: 3.5 MIL/uL — ABNORMAL LOW (ref 4.22–5.81)
RDW: 16.7 % — ABNORMAL HIGH (ref 11.5–15.5)
WBC: 3.6 10*3/uL — ABNORMAL LOW (ref 4.0–10.5)
nRBC: 0 % (ref 0.0–0.2)

## 2020-11-29 LAB — MAGNESIUM: Magnesium: 2 mg/dL (ref 1.7–2.4)

## 2020-11-29 MED ORDER — POTASSIUM CHLORIDE CRYS ER 20 MEQ PO TBCR
40.0000 meq | EXTENDED_RELEASE_TABLET | Freq: Once | ORAL | Status: AC
  Administered 2020-11-29: 40 meq via ORAL
  Filled 2020-11-29: qty 2

## 2020-11-29 MED ORDER — PSYLLIUM 95 % PO PACK
1.0000 | PACK | Freq: Two times a day (BID) | ORAL | Status: DC
  Administered 2020-11-29 – 2020-12-10 (×19): 1 via ORAL
  Filled 2020-11-29 (×27): qty 1

## 2020-11-29 MED ORDER — KETOROLAC TROMETHAMINE 15 MG/ML IJ SOLN
15.0000 mg | Freq: Once | INTRAMUSCULAR | Status: AC
  Administered 2020-11-29: 15 mg via INTRAVENOUS
  Filled 2020-11-29: qty 1

## 2020-11-29 NOTE — Care Management Important Message (Signed)
Important Message  Patient Details IM Letter given to the Patient. Name: [name] MRN: [id number] Date of Birth: 1960/03/15   Medicare Important Message Given:  Yes     Kerin Salen 11/29/2020, 1:24 PM

## 2020-11-29 NOTE — Progress Notes (Signed)
Pt didn't have urine output for 11 hours. Bladder scanned pt and showed 249 mL. NP Quillian Quince made aware. Will continue to monitor pt

## 2020-11-29 NOTE — Progress Notes (Signed)
[facility] Progress [facility]  [name] 60 y.o. October 30, 1960  CC:  Carita Pian syndrome/colonic pseudo-obstruction   Subjective: Patient was very somnolent during interview. Shook his head no when asked about pain, nausea, or vomiting. Per nurse report, patient has had 3 liquid stools yesterday noted in rectal pouch/bag  ROS : Review of Systems  Cardiovascular:  Negative for chest pain and palpitations.  Gastrointestinal:  Positive for constipation. Negative for abdominal pain, blood in stool, diarrhea, heartburn, melena, nausea and vomiting.     Objective: Vital signs in last 24 hours: Vitals:   11/28/20 2039 11/29/20 0449  BP: 119/76 123/77  Pulse: 78 71  Resp: 18 16  Temp: 99.1 F (37.3 C) 98.4 F (36.9 C)  SpO2: 100% 99%    Physical Exam:  General:  Somnolent, cooperative, no distress  Head:  Normocephalic, without obvious abnormality, atraumatic  Eyes:  Anicteric sclera, EOM's intact  Lungs:   Clear to auscultation bilaterally, respirations unlabored  Heart:  Regular rate and rhythm, S1, S2 normal  Abdomen:   Mild distention, improvement from previous exams. Distant bowel sounds. Nontender.  no masses,     Lab Results: Recent Labs    11/28/20 0353 11/29/20 0755  NA 137 137  K 3.8 3.9  CL 101 101  CO2 29 28  GLUCOSE 175* 143*  BUN 27* 22*  CREATININE 1.34* 1.46*  CALCIUM 8.3* 8.1*  MG  --  2.0   Recent Labs    11/29/20 0755  AST 35  ALT 28  ALKPHOS 166*  BILITOT 0.6  PROT 6.0*  ALBUMIN 2.6*   Recent Labs    11/28/20 0353 11/29/20 0755  WBC 4.4 3.6*  HGB 9.3* 8.9*  HCT 29.3* 27.9*  MCV 79.0* 79.7*  PLT 152 146*   No results for input(s): LABPROT, INR in the last 72 hours.    Assessment Ogilvie syndrome/colonic pseudo-obstruction: s/p colonic decompression 9/25 with persistent/recurrent of symptoms -Repeat abdominal x-ray 10/4: Mild interval increase in gaseous distention of the colon compared to previous day's study - Magnesium  2.0, Phosphorus 2.8  - Potassium 3.9 (improved from 3.3 10/3)     Plan: Continue to maintain magnesium above 2 and potassium at 4-4.5.    Can continue full liquids as tolerated. Although xray does not show improvement in gaseous distention, patient appears to be improving clinically.   Will get repeat xray.   Continue Miralax   [facility] will follow    Garnette Scheuermann PA-C 11/29/2020, 8:29 AM  Contact #  [phone]

## 2020-11-29 NOTE — Plan of Care (Signed)
  Problem: Clinical Measurements: Goal: Will remain free from infection Outcome: Progressing Goal: Diagnostic test results will improve Outcome: Progressing   Problem: Nutrition: Goal: Adequate nutrition will be maintained Outcome: Progressing   Problem: Elimination: Goal: Will not experience complications related to bowel motility Outcome: Progressing   Problem: Pain Managment: Goal: General experience of comfort will improve Outcome: Progressing   Problem: Skin Integrity: Goal: Risk for impaired skin integrity will decrease Outcome: Progressing

## 2020-11-29 NOTE — Progress Notes (Signed)
Pt refused lab draw at this time and told lab tech to come back later this morning.

## 2020-11-29 NOTE — Progress Notes (Signed)
PROGRESS NOTE    [name]  JZP:[id number] DOB: Feb 11, 1961 DOA: 11/18/2020 PCP: [name], NP   Brief Narrative:  The patient is a 60 year old chronically ill-appearing African-American morbidly obese male with a past medical history significant for but not limited to heart failure with preserved ejection fraction, hypertension, history of CVA, history of type 2 diabetes mellitus with neuropathy, CKD stage IIIa, polysubstance abuse, chronic pain, depression, hyperlipidemia as well as other comorbidities who presented with worsening abdominal pain.  He was found to have significant gastric distention in the colon on imaging and general surgery and GI were following.  Of note the patient has had bowel decompression in 2021 with Dr. Alessandra Bevels.  He decompression on 11/19/2020 without significant improvement.  GI also recommended NG tube for 11/19/2020 which eventually was removed on 11/28/2020.  He continues to be very slow to improve and currently has rectal tube in place and is on MiraLAX.  He currently remains hungry wants to eat solid food but GI is recommending continuing a liquid diet as they are elected to advance patient's diet to regular food as he does not have much improvement in his abdominal x-ray.  He cannot be mobilized due to his bedbound status and this adds a challenge of colonic ileus.  GI recommends continuing full liquid diet for now and potassium to be kept overnight for magnesium over 2 and continue MiraLAX twice daily as well as Metamucil twice a day and repeating a abdominal x-ray in the a.m.   Assessment & Plan:   Active Problems:   Essential hypertension   Diabetes mellitus type 2, uncontrolled   Hypokalemia   Grade I diastolic dysfunction   Ileus (HCC)   Ogilvie syndrome   Depression   Acute-on-chronic kidney injury ([place])   BPH (benign prostatic hyperplasia)   Pressure injury of skin   Abdominal pain, generalized   Ogilvie syndrome/colonic  pseudoobstruction -Slow to improve -Is admitted to the hospital with Ogilvie syndrome -GI has been consulted and following the patient -Due to his abdominal distention and nausea he had an NG tube placed which was removed on 05/2020 -He underwent a colonoscopy with decompression 11/19/2020 with minimal improvement -He has had a rectal tube in place since 11/24/2020 as well as been placed on twice daily MiraLAX -General surgery has been consulted but did not recommend any surgical intervention at this time -He appears to be improving very slowly and he is increased output from his rectal tube -KUB without significant improvement as he continues to show diffuse gaseous distention of the colon with no free intraperitoneal air clinically looks a little bit better -NG tube was discontinued yesterday and GI recommending allowing for full liquid diet -GI recommending not advancing past a full liquid diet and keeping his electrolytes with magnesium above 2 and a potassium above 4 as well as continuing MiraLAX twice daily dosing as well as adding Metamucil twice daily and repeating a KUB in the morning  Morbid Obesity -Complicates overall prognosis and Care -Estimated body mass index is 42.84 kg/m as calculated from the following:   Height as of this encounter: 5\' 8"  (1.727 m).   Weight as of this encounter: 127.8 kg. -Weight Loss and Dietary Counseling given   Hypokalemia -Potassium has been improving and today was 3.8 but will continue to replete have a goal above 4 -He has a poor p.o. intake and continues to have diarrhea so we will need to monitor carefully -Continue monitoring magnesium level as well -Repeat CMP  in a.m.  AKI on CKD stage IIIa -Improving with IV fluid hydration -He has a baseline creatinine of 1.2 and today his BUN/creatinine slightly went up and was 22/1.46 -He is currently on D5 with KCl 20 mEq at 50 MLS per hour continuous -Avoid further nephrotoxic medications, contrast  dyes, hypotension renally dose medication -Repeat CMP in a.m.  Heart failure with preserved ejection fraction chronic diastolic CHF currently not in exacerbation -Last echocardiogram done was in 07/2018 with EF of 65 to 70% and grade 2 diastolic dysfunction with a small pericardial effusion and mild aortic valve stenosis -He continues remain on Coreg, BiDil and amlodipine -We will need to resume his Entresto in the next day or so along with discontinuation of clonidine blood pressure allows and if he is able to consistently take p.o. -Continue monitor for signs and symptoms of volume overload as he is currently being rehydrated with D5 with 20 mEq of KCl at 50 MLS per hour  Diabetes mellitus type 2 -Continue sliding scale insulin -His CBGs were okay but he is n.p.o. but now they are starting to rise but -We will add back his Lantus at a lower dose and resume his Jardiance -We will need to continue monitor blood sugars per protocol and CBGs have been ranging from 132-173  Microcytic anemia/anemia chronic kidney disease -Hemoglobin/hematocrit went from 9.3/29.3 is now 8.9/27.9Is -Check anemia panel in the -Continue to monitor for signs and symptoms of bleeding; currently no overt bleeding -Repeat CBC in a.m.  Thrombocytopenia -Mild as patient's platelet count went from 152 is now 146 -Continue to monitor for signs and symptoms of bleeding; currently no overt bleeding noted -Repeat CBC in a.m.  History of CAD -His aspirin 81 mg p.o. daily has been resumed along with his atorvastatin 80 mg p.o. nightly as well as carvedilol 12.5 mg p.o. twice daily and also continue with isosorbide-hydralazine 1 tab p.o. 3 times daily  Depression and anxiety -Continue with his home sertraline 50 mg p.o. daily  BPH -Continue with tamsulosin 0.8 mg p.o. daily -Continue bladder scan and if necessary will need to reinsert Foley catheter as it has been removed  DVT prophylaxis: Enoxaparin 60 mg subcu every  24 Code Status: FULL CODE  Family Communication: No family currently at bedside Disposition Plan: Pending further clinical improvement and tolerance of diet and clearance by gastroenterology  Status is: Inpatient  Remains inpatient appropriate because:Unsafe d/c plan, IV treatments appropriate due to intensity of illness or inability to take PO, and Inpatient level of care appropriate due to severity of illness  Dispo: The patient is from: Home              Anticipated d/c is to:  TBD              Patient currently is not medically stable to d/c.   Difficult to place patient No  Consultants:  General surgery Gastroenterology  Procedures: Colonoscopy  Antimicrobials:  Anti-infectives (From admission, onward)    None        Subjective: Seen and examined at bedside and he was doing okay and had some mild abdominal distention and pain on palpation.  Wanting to eat.  Denies any chest pain or shortness of breath.  No other concerns or complaints at this time  Objective: Vitals:   11/28/20 2039 11/29/20 0449 11/29/20 1057 11/29/20 1345  BP: 119/76 123/77 (!) 157/95 134/80  Pulse: 78 71 76 76  Resp: 18 16  18   Temp: 99.1 F (37.3  C) 98.4 F (36.9 C)  98.5 F (36.9 C)  TempSrc: Oral Oral  Oral  SpO2: 100% 99%  98%  Weight:      Height:        Intake/Output Summary (Last 24 hours) at 11/29/2020 2028 Last data filed at 11/29/2020 0600 Gross per 24 hour  Intake 719.95 ml  Output 220 ml  Net 499.95 ml   Filed Weights   11/19/20 1626  Weight: 127.8 kg   Examination: Physical Exam:  Constitutional: WN/WD morbidly obese African-American male currently in no acute distress appears slightly uncomfortable and mildly agitated Eyes: Lids and conjunctivae normal, sclerae anicteric  ENMT: External Ears, Nose appear normal. Grossly normal hearing. Mucous membranes are moist. Neck: Appears normal, supple, no cervical masses, normal ROM, no appreciable thyromegaly; no appreciable  JVD Respiratory: Diminished to auscultation bilaterally, no wheezing, rales, rhonchi or crackles. Normal respiratory effort and patient is not tachypenic. No accessory muscle use.  Unlabored breathing Cardiovascular: RRR, no murmurs / rubs / gallops. S1 and S2 auscultated.  Minimal extremity edema Abdomen: Soft, tender to palpate, significantly distended.  Bowel sounds positive and slightly hyperactive GU: Deferred. Musculoskeletal: No clubbing / cyanosis of digits/nails. No joint deformity upper and lower extremities.  Skin: No rashes, lesions, ulcers on limited skin evaluation but bilateral legs are and Prevalon boots. No induration; Warm and dry.  Neurologic: CN 2-12 grossly intact with no focal deficits. Romberg sign and cerebellar reflexes not assessed.  Psychiatric: Normal judgment and insight. Alert and oriented x 3.  Mildly anxious mood and appropriate affect.   Data Reviewed: I have personally reviewed following labs and imaging studies  CBC: Recent Labs  Lab 11/25/20 0402 11/26/20 0407 11/27/20 0345 11/28/20 0353 11/29/20 0755  WBC 5.4 4.8 5.1 4.4 3.6*  HGB 9.4* 9.8* 9.2* 9.3* 8.9*  HCT 31.2* 31.5* 29.0* 29.3* 27.9*  MCV 82.8 81.4 79.9* 79.0* 79.7*  PLT 164 168 149* 152 397*   Basic Metabolic Panel: Recent Labs  Lab 11/23/20 1250 11/24/20 0731 11/25/20 0402 11/26/20 0407 11/27/20 0345 11/28/20 0353 11/29/20 0755  NA 150*   < > 144 141 132* 137 137  K 3.4*   < > 3.0* 3.2* 3.3* 3.8 3.9  CL 111   < > 105 99 95* 101 101  CO2 28   < > 31 32 30 29 28   GLUCOSE 182*   < > 196* 231* 144* 175* 143*  BUN 38*   < > 37* 32* 30* 27* 22*  CREATININE 1.70*   < > 1.86* 1.54* 1.58* 1.34* 1.46*  CALCIUM 8.8*   < > 8.2* 8.5* 7.9* 8.3* 8.1*  MG 2.4  --   --  2.0  --   --  2.0  PHOS  --   --   --  2.8  --   --   --    < > = values in this interval not displayed.   GFR: Estimated Creatinine Clearance: 71 mL/min (A) (by C-G formula based on SCr of 1.46 mg/dL (H)). Liver Function  Tests: Recent Labs  Lab 11/29/20 0755  AST 35  ALT 28  ALKPHOS 166*  BILITOT 0.6  PROT 6.0*  ALBUMIN 2.6*   No results for input(s): LIPASE, AMYLASE in the last 168 hours. No results for input(s): AMMONIA in the last 168 hours. Coagulation Profile: No results for input(s): INR, PROTIME in the last 168 hours. Cardiac Enzymes: No results for input(s): CKTOTAL, CKMB, CKMBINDEX, TROPONINI in the last 168 hours. BNP (last  3 results) No results for input(s): PROBNP in the last 8760 hours. HbA1C: No results for input(s): HGBA1C in the last 72 hours. CBG: Recent Labs  Lab 11/28/20 1708 11/28/20 2035 11/29/20 0746 11/29/20 1139 11/29/20 1650  GLUCAP 147* 173* 132* 161* 168*   Lipid Profile: No results for input(s): CHOL, HDL, LDLCALC, TRIG, CHOLHDL, LDLDIRECT in the last 72 hours. Thyroid Function Tests: No results for input(s): TSH, T4TOTAL, FREET4, T3FREE, THYROIDAB in the last 72 hours. Anemia Panel: No results for input(s): VITAMINB12, FOLATE, FERRITIN, TIBC, IRON, RETICCTPCT in the last 72 hours. Sepsis Labs: No results for input(s): PROCALCITON, LATICACIDVEN in the last 168 hours.  No results found for this or any previous visit (from the past 240 hour(s)).   RN Pressure Injury Documentation: Pressure Injury 11/18/20 Heel Right;Posterior Deep Tissue Pressure Injury - Purple or maroon localized area of discolored intact skin or blood-filled blister due to damage of underlying soft tissue from pressure and/or shear. (Active)  11/18/20 2300  Location: Heel  Location Orientation: Right;Posterior  Staging: Deep Tissue Pressure Injury - Purple or maroon localized area of discolored intact skin or blood-filled blister due to damage of underlying soft tissue from pressure and/or shear.  Wound Description (Comments):   Present on Admission: Yes     Pressure Injury 11/25/20 Heel Left Deep Tissue Pressure Injury - Purple or maroon localized area of discolored intact skin or  blood-filled blister due to damage of underlying soft tissue from pressure and/or shear. (Active)  11/25/20 2000  Location: Heel  Location Orientation: Left  Staging: Deep Tissue Pressure Injury - Purple or maroon localized area of discolored intact skin or blood-filled blister due to damage of underlying soft tissue from pressure and/or shear.  Wound Description (Comments):   Present on Admission:      Estimated body mass index is 42.84 kg/m as calculated from the following:   Height as of this encounter: 5\' 8"  (1.727 m).   Weight as of this encounter: 127.8 kg.  Malnutrition Type:  Malnutrition Characteristics:   Nutrition Interventions:   Radiology Studies: DG Abd 1 View  Result Date: 11/29/2020 CLINICAL DATA:  Ileus EXAM: ABDOMEN - 1 VIEW COMPARISON:  11/28/2020 FINDINGS: Enteric tube is no longer visualized and may be removed or retracted. Diffuse gaseous distension of the colon is grossly similar compared to the previous exam. No gross free intraperitoneal air on AP supine views. IMPRESSION: 1. Diffuse gaseous distension of the colon is grossly similar compared to the previous exam. 2. NG tube is no longer visualized. Electronically Signed   By: Davina Poke D.O.   On: 11/29/2020 11:29   DG Abd 1 View  Result Date: 11/28/2020 CLINICAL DATA:  Follow-up ileus. EXAM: ABDOMEN - 1 VIEW COMPARISON:  11/27/2020 and older studies. FINDINGS: Gaseous distention of colon appears increased from the previous day's exam, no current evidence of small bowel dilation. Nasal/orogastric tube is stable, tip projecting in the left upper quadrant. IMPRESSION: 1. Mild interval increase in gaseous distention of the colon compared to previous day's study. Electronically Signed   By: Lajean Manes M.D.   On: 11/28/2020 08:19    Scheduled Meds:  amLODipine  10 mg Oral Daily   aspirin  81 mg Oral Daily   atorvastatin  80 mg Oral QHS   carvedilol  12.5 mg Oral BID WC   chlorhexidine  15 mL Mouth Rinse  BID   Chlorhexidine Gluconate Cloth  6 each Topical Daily   cloNIDine  0.3 mg Transdermal Weekly  empagliflozin  10 mg Oral Daily   enoxaparin (LOVENOX) injection  60 mg Subcutaneous Q24H   insulin aspart  0-6 Units Subcutaneous TID WC   insulin glargine-yfgn  5 Units Subcutaneous Daily   isosorbide-hydrALAZINE  1 tablet Oral TID   mouth rinse  15 mL Mouth Rinse q12n4p   polyethylene glycol  17 g Oral BID   psyllium  1 packet Oral BID   sertraline  50 mg Oral Daily   tamsulosin  0.8 mg Oral Daily   Continuous Infusions:  sodium chloride     dextrose 5 % with KCl 20 mEq / L 20 mEq (11/29/20 1401)    LOS: 10 days   Kerney Elbe, DO [facility] PAGER is on AMION  If 7PM-7AM, please contact night-coverage [email or web address]

## 2020-11-30 ENCOUNTER — Inpatient Hospital Stay (HOSPITAL_COMMUNITY): Payer: Medicare HMO

## 2020-11-30 DIAGNOSIS — F32A Depression, unspecified: Secondary | ICD-10-CM | POA: Diagnosis not present

## 2020-11-30 DIAGNOSIS — N179 Acute kidney failure, unspecified: Secondary | ICD-10-CM | POA: Diagnosis not present

## 2020-11-30 DIAGNOSIS — N400 Enlarged prostate without lower urinary tract symptoms: Secondary | ICD-10-CM | POA: Diagnosis not present

## 2020-11-30 DIAGNOSIS — R1084 Generalized abdominal pain: Secondary | ICD-10-CM | POA: Diagnosis not present

## 2020-11-30 LAB — CBC WITH DIFFERENTIAL/PLATELET
Abs Immature Granulocytes: 0.02 10*3/uL (ref 0.00–0.07)
Basophils Absolute: 0 10*3/uL (ref 0.0–0.1)
Basophils Relative: 0 %
Eosinophils Absolute: 0.1 10*3/uL (ref 0.0–0.5)
Eosinophils Relative: 3 %
HCT: 28.9 % — ABNORMAL LOW (ref 39.0–52.0)
Hemoglobin: 9.2 g/dL — ABNORMAL LOW (ref 13.0–17.0)
Immature Granulocytes: 0 %
Lymphocytes Relative: 25 %
Lymphs Abs: 1.2 10*3/uL (ref 0.7–4.0)
MCH: 25 pg — ABNORMAL LOW (ref 26.0–34.0)
MCHC: 31.8 g/dL (ref 30.0–36.0)
MCV: 78.5 fL — ABNORMAL LOW (ref 80.0–100.0)
Monocytes Absolute: 0.7 10*3/uL (ref 0.1–1.0)
Monocytes Relative: 15 %
Neutro Abs: 2.8 10*3/uL (ref 1.7–7.7)
Neutrophils Relative %: 57 %
Platelets: 182 10*3/uL (ref 150–400)
RBC: 3.68 MIL/uL — ABNORMAL LOW (ref 4.22–5.81)
RDW: 16.5 % — ABNORMAL HIGH (ref 11.5–15.5)
WBC: 4.9 10*3/uL (ref 4.0–10.5)
nRBC: 0 % (ref 0.0–0.2)

## 2020-11-30 LAB — COMPREHENSIVE METABOLIC PANEL
ALT: 18 U/L (ref 0–44)
AST: 14 U/L — ABNORMAL LOW (ref 15–41)
Albumin: 2.9 g/dL — ABNORMAL LOW (ref 3.5–5.0)
Alkaline Phosphatase: 160 U/L — ABNORMAL HIGH (ref 38–126)
Anion gap: 5 (ref 5–15)
BUN: 23 mg/dL — ABNORMAL HIGH (ref 6–20)
CO2: 27 mmol/L (ref 22–32)
Calcium: 8.3 mg/dL — ABNORMAL LOW (ref 8.9–10.3)
Chloride: 99 mmol/L (ref 98–111)
Creatinine, Ser: 1.45 mg/dL — ABNORMAL HIGH (ref 0.61–1.24)
GFR, Estimated: 56 mL/min — ABNORMAL LOW (ref >60)
Glucose, Bld: 145 mg/dL — ABNORMAL HIGH (ref 70–99)
Potassium: 4.2 mmol/L (ref 3.5–5.1)
Sodium: 131 mmol/L — ABNORMAL LOW (ref 135–145)
Total Bilirubin: 0.8 mg/dL (ref 0.3–1.2)
Total Protein: 6.3 g/dL — ABNORMAL LOW (ref 6.5–8.1)

## 2020-11-30 LAB — MAGNESIUM: Magnesium: 2 mg/dL (ref 1.7–2.4)

## 2020-11-30 LAB — PHOSPHORUS: Phosphorus: 3.7 mg/dL (ref 2.5–4.6)

## 2020-11-30 LAB — GLUCOSE, CAPILLARY
Glucose-Capillary: 143 mg/dL — ABNORMAL HIGH (ref 70–99)
Glucose-Capillary: 130 mg/dL — ABNORMAL HIGH (ref 70–99)
Glucose-Capillary: 123 mg/dL — ABNORMAL HIGH (ref 70–99)
Glucose-Capillary: 167 mg/dL — ABNORMAL HIGH (ref 70–99)

## 2020-11-30 MED ORDER — KCL IN DEXTROSE-NACL 20-5-0.9 MEQ/L-%-% IV SOLN
INTRAVENOUS | Status: DC
  Filled 2020-11-30 (×12): qty 1000

## 2020-11-30 NOTE — Progress Notes (Signed)
PT Cancellation Note  Patient Details Name: [name] MRN: [id number] DOB: Mar 22, 1960   Cancelled Treatment:    Reason Eval/Treat Not Completed: PT screened, no needs identified, will sign off  Pt from [facility] facility and "bedbound".  Pt appears to use lift for OOB to motorized w/c.  PT to sign off.   Kati L Payson 11/30/2020, 1:07 PM Arlyce Dice, DPT Acute Rehabilitation Services Pager: [phone] Office: [phone]

## 2020-11-30 NOTE — Progress Notes (Signed)
OT Cancellation Note  Patient Details Name: [name] MRN: [id number] DOB: 12/23/1960   Cancelled Treatment:    Reason Eval/Treat Not Completed: OT screened, no needs identified, will sign off. Patient from facility. Per patient he hasn't "gotten out of bed for a long time" and requires assistance for ADLs at bed level. Reports "I need a lot of assistance for everything." Patient appears to be at his baseline - requiring 24/7 assistance for all bed mobility and self care tasks. No therapy needs at this time.  [name] [name] 11/30/2020, 1:06 PM

## 2020-11-30 NOTE — Progress Notes (Signed)
[facility] Gastroenterology Progress Note  AMROM ORE 60 y.o. 02/26/1960  CC:  Carita Pian syndrome/colonic pseudo-obstruction    Subjective: Patient states he is doing well today besides the pain from his right shoulder which happened during a fall at his facility. No nausea, vomiting, abdominal pain. Continues to have liquid stool noted in rectal pouch/bag.  ROS : Review of Systems  Cardiovascular:  Negative for chest pain and palpitations.  Gastrointestinal:  Negative for abdominal pain, blood in stool, constipation, diarrhea, heartburn, melena, nausea and vomiting.     Objective: Vital signs in last 24 hours: Vitals:   11/29/20 2045 11/30/20 0510  BP: 114/67 136/84  Pulse: 80 78  Resp: 16 16  Temp: 98.8 F (37.1 C) 98.6 F (37 C)  SpO2: 99% 98%    Physical Exam:  General:  Alert, cooperative, no distress, appears stated age  Head:  Normocephalic, without obvious abnormality, atraumatic  Eyes:  Anicteric sclera, EOM's intact  Lungs:   Clear to auscultation bilaterally, respirations unlabored  Heart:  Regular rate and rhythm, S1, S2 normal  Abdomen:   Minimal distention, no tenderness, guarding, or rebound. Slightly distant bowel sounds.    Lab Results: Recent Labs    11/29/20 0755 11/30/20 1015  NA 137 131*  K 3.9 4.2  CL 101 99  CO2 28 27  GLUCOSE 143* 145*  BUN 22* 23*  CREATININE 1.46* 1.45*  CALCIUM 8.1* 8.3*  MG 2.0 2.0  PHOS  --  3.7   Recent Labs    11/29/20 0755 11/30/20 1015  AST 35 14*  ALT 28 18  ALKPHOS 166* 160*  BILITOT 0.6 0.8  PROT 6.0* 6.3*  ALBUMIN 2.6* 2.9*   Recent Labs    11/29/20 0755 11/30/20 1015  WBC 3.6* 4.9  NEUTROABS  --  2.8  HGB 8.9* 9.2*  HCT 27.9* 28.9*  MCV 79.7* 78.5*  PLT 146* 182   No results for input(s): LABPROT, INR in the last 72 hours.    Assessment Ogilvie syndrome/colonic pseudo-obstruction s/p colonic decompression 9/25 with persistent/recurrent of symptoms -Repeat abdominal x-ray 10/6: Persistent  diffuse gaseous distention of the colon, similar to prior exam. - Magnesium 2.0, Phosphorus 3.7 - Potassium 4.2  Plan: Continue full liquid diet  Continue to maintain magnesium above 2 and potassium at 4-4.5.   Continue MiraLAX BID and metamucil BID.  [facility] will follow   Garnette Scheuermann PA-C 11/30/2020, 11:37 AM  Contact #  [phone]

## 2020-11-30 NOTE — Progress Notes (Signed)
PROGRESS NOTE    [name]  VPX:[id number] DOB: [birth year] DOA: 11/18/2020 PCP: [name], NP   Brief Narrative:  The patient is a 60 year old chronically ill-appearing African-American morbidly obese male with a past medical history significant for but not limited to heart failure with preserved ejection fraction, hypertension, history of CVA, history of type 2 diabetes mellitus with neuropathy, CKD stage IIIa, polysubstance abuse, chronic pain, depression, hyperlipidemia as well as other comorbidities who presented with worsening abdominal pain.  He was found to have significant gastric distention in the colon on imaging and general surgery and GI were following.  Of note the patient has had bowel decompression in 2021 with Dr. Alessandra Bevels.  He decompression on 11/19/2020 without significant improvement.  GI also recommended NG tube for 11/19/2020 which eventually was removed on 11/28/2020.  He continues to be very slow to improve and currently has rectal tube in place and is on MiraLAX.  He currently remains hungry wants to eat solid food but GI is recommending continuing a liquid diet as they are elected to advance patient's diet to regular food as he does not have much improvement in his abdominal x-ray.  He cannot be mobilized due to his bedbound status and this adds a challenge of colonic ileus.  GI recommends continuing full liquid diet for now and potassium to be kept overnight for magnesium over 2 and continue MiraLAX twice daily as well as Metamucil twice a day and repeating a abdominal x-ray in the a.m.  Repeat KUB today showed persistent gaseous distention of the colon similar to prior exam.  GI recommends continuing full liquid diet today continuing electrolyte supplementation as well as continue MiraLAX twice daily and Metamucil twice daily  Assessment & Plan:   Active Problems:   Essential hypertension   Diabetes mellitus type 2, uncontrolled   Hypokalemia   Grade I  diastolic dysfunction   Ileus (HCC)   Ogilvie syndrome   Depression   Acute-on-chronic kidney injury ([place])   BPH (benign prostatic hyperplasia)   Pressure injury of skin   Abdominal pain, generalized   Ogilvie syndrome/colonic pseudoobstruction -Slow to improve -Is admitted to the hospital with Ogilvie syndrome -GI has been consulted and following the patient -Due to his abdominal distention and nausea he had an NG tube placed which was removed on 05/2020 -He underwent a colonoscopy with decompression 11/19/2020 with minimal improvement -He has had a rectal tube in place since 11/24/2020 as well as been placed on twice daily MiraLAX -General surgery has been consulted but did not recommend any surgical intervention at this time -He appears to be improving very slowly and he is increased output from his rectal tube -KUB without significant improvement as he continues to show diffuse gaseous distention of the colon with no free intraperitoneal air clinically looks a little bit better -NG tube was discontinued yesterday and GI recommending allowing for full liquid diet -GI recommending not advancing past a full liquid diet and keeping his electrolytes with magnesium above 2 and a potassium above 4 as well as continuing MiraLAX twice daily dosing as well as adding Metamucil twice daily and repeating another KUB in the morning  Morbid Obesity -Complicates overall prognosis and Care -Estimated body mass index is 42.84 kg/m as calculated from the following:   Height as of this encounter: 5\' 8"  (1.727 m).   Weight as of this encounter: 127.8 kg. -Weight Loss and Dietary Counseling given   Hypokalemia -Potassium has been improving and today was 4.2  but will continue to replete have a goal above 4 -He has a poor p.o. intake and continues to have diarrhea so we will need to monitor carefully -Continue monitoring magnesium level as well -Repeat CMP in a.m.  AKI on CKD stage IIIa -Improving  with IV fluid hydration -He has a baseline creatinine of 1.2 and today his BUN/creatinine slightly went up and was 22/1.46 yesterday and today it is 23/1.45 -He is currently on D5 with KCl 20 mEq at 50 MLS per hour continuous -Avoid further nephrotoxic medications, contrast dyes, hypotension renally dose medication -Repeat CMP in a.m.  Heart failure with preserved ejection fraction chronic diastolic CHF currently not in exacerbation -Last echocardiogram done was in 07/2018 with EF of 65 to 70% and grade 2 diastolic dysfunction with a small pericardial effusion and mild aortic valve stenosis -He continues remain on Coreg, BiDil and amlodipine -We will need to resume his Entresto in the next day or so along with discontinuation of clonidine blood pressure allows and if he is able to consistently take p.o. -Continue monitor for signs and symptoms of volume overload as he is currently being rehydrated with D5 with 20 mEq of KCl at 50 MLS per hour but will add normal saline as well  Diabetes mellitus type 2 -Continue sliding scale insulin -His CBGs were okay but he is n.p.o. but now they are starting to rise but -We will add back his Lantus at a lower dose and resume his Jardiance -We will need to continue monitor blood sugars per protocol and CBGs have been ranging from 130-168  Microcytic anemia/anemia chronic kidney disease -Hemoglobin/hematocrit went from 9.3/29.3 is now 8.9/27.9 yesterday and today is back up to 9.2/20 point -Check anemia panel in the the a.m. -Continue to monitor for signs and symptoms of bleeding; currently no overt bleeding -Repeat CBC in a.m.  Thrombocytopenia -Mild as patient's platelet count went from 152 is now 146 yesterday and has improved to 182 today -Continue to monitor for signs and symptoms of bleeding; currently no overt bleeding noted -Repeat CBC in a.m.  History of CAD -His aspirin 81 mg p.o. daily has been resumed along with his atorvastatin 80 mg p.o.  nightly as well as carvedilol 12.5 mg p.o. twice daily and also continue with isosorbide-hydralazine 1 tab p.o. 3 times daily  Depression and anxiety -Continue with his home sertraline 50 mg p.o. daily  Hyponatremia -Mild.  Patient's sodium went from 137 is now trended down to 131 -Likely drop in the setting of D5; we will be adding normal saline in the D5 now -Continue monitor and trend and repeat CMP in a.m.  BPH -Continue with tamsulosin 0.8 mg p.o. daily -Continue bladder scan and if necessary will need to reinsert Foley catheter as it has been removed  DVT prophylaxis: Enoxaparin 60 mg subcu every 24 Code Status: FULL CODE  Family Communication: No family currently at bedside Disposition Plan: Pending further clinical improvement and tolerance of diet and clearance by gastroenterology  Status is: Inpatient  Remains inpatient appropriate because:Unsafe d/c plan, IV treatments appropriate due to intensity of illness or inability to take PO, and Inpatient level of care appropriate due to severity of illness  Dispo: The patient is from: Home              Anticipated d/c is to:  TBD              Patient currently is not medically stable to d/c.   Difficult to place patient  No  Consultants:  General surgery Gastroenterology  Procedures: Colonoscopy  Antimicrobials:  Anti-infectives (From admission, onward)    None        Subjective: Seen and examined at bedside and he was wanting to sleep and rest.  Had no issues but GI still feels his abdomen is still very distended and does not want to advance past a full liquid diet.  Denies any chest pain or shortness of breath.  Just wants to sleep.  No other concerns complaints at this time  Objective: Vitals:   11/29/20 1345 11/29/20 2045 11/30/20 0510 11/30/20 1307  BP: 134/80 114/67 136/84 139/82  Pulse: 76 80 78 81  Resp: 18 16 16 16   Temp: 98.5 F (36.9 C) 98.8 F (37.1 C) 98.6 F (37 C) 98.6 F (37 C)  TempSrc: Oral  Oral Oral Oral  SpO2: 98% 99% 98% 98%  Weight:      Height:        Intake/Output Summary (Last 24 hours) at 11/30/2020 1511 Last data filed at 11/30/2020 0930 Gross per 24 hour  Intake 1287.92 ml  Output 1650 ml  Net -362.08 ml    Filed Weights   11/19/20 1626  Weight: 127.8 kg   Examination: Physical Exam:  Constitutional: WN/WD morbidly obese African-American male currently in no acute distress appears comfortable and somnolent as he wants to sleep Eyes: Lids and conjunctivae normal, sclerae anicteric  ENMT: External Ears, Nose appear normal. Grossly normal hearing.  Neck: Appears normal, supple, no cervical masses, normal ROM, no appreciable thyromegaly; no JVD Respiratory: Mildly diminished to auscultation bilaterally with coarse breath sounds, no wheezing, rales, rhonchi or crackles. Normal respiratory effort and patient is not tachypenic. No accessory muscle use.  Unlabored breathing Cardiovascular: RRR, no murmurs / rubs / gallops. S1 and S2 auscultated.  Slight lower extremity edema Abdomen: Soft, a little tender to palpate, significant gaseous distention and is little hypertympanic to percussion.  Bowel sounds are present but hypoactive GU: Deferred. Musculoskeletal: No clubbing / cyanosis of digits/nails. Normal strength and muscle tone.  Skin: No rashes, lesions, ulcers on limited skin evaluation. No induration; Warm and dry.  Neurologic: CN 2-12 grossly intact with no focal deficits. Romberg sign and cerebellar reflexes not assessed.  Psychiatric: Normal judgment and insight.  He is somnolent and drowsy and wants to sleep.   Data Reviewed: I have personally reviewed following labs and imaging studies  CBC: Recent Labs  Lab 11/26/20 0407 11/27/20 0345 11/28/20 0353 11/29/20 0755 11/30/20 1015  WBC 4.8 5.1 4.4 3.6* 4.9  NEUTROABS  --   --   --   --  2.8  HGB 9.8* 9.2* 9.3* 8.9* 9.2*  HCT 31.5* 29.0* 29.3* 27.9* 28.9*  MCV 81.4 79.9* 79.0* 79.7* 78.5*  PLT 168  149* 152 146* 758    Basic Metabolic Panel: Recent Labs  Lab 11/26/20 0407 11/27/20 0345 11/28/20 0353 11/29/20 0755 11/30/20 1015  NA 141 132* 137 137 131*  K 3.2* 3.3* 3.8 3.9 4.2  CL 99 95* 101 101 99  CO2 32 30 29 28 27   GLUCOSE 231* 144* 175* 143* 145*  BUN 32* 30* 27* 22* 23*  CREATININE 1.54* 1.58* 1.34* 1.46* 1.45*  CALCIUM 8.5* 7.9* 8.3* 8.1* 8.3*  MG 2.0  --   --  2.0 2.0  PHOS 2.8  --   --   --  3.7    GFR: Estimated Creatinine Clearance: 71.5 mL/min (A) (by C-G formula based on SCr of 1.45 mg/dL (H)).  Liver Function Tests: Recent Labs  Lab 11/29/20 0755 11/30/20 1015  AST 35 14*  ALT 28 18  ALKPHOS 166* 160*  BILITOT 0.6 0.8  PROT 6.0* 6.3*  ALBUMIN 2.6* 2.9*    No results for input(s): LIPASE, AMYLASE in the last 168 hours. No results for input(s): AMMONIA in the last 168 hours. Coagulation Profile: No results for input(s): INR, PROTIME in the last 168 hours. Cardiac Enzymes: No results for input(s): CKTOTAL, CKMB, CKMBINDEX, TROPONINI in the last 168 hours. BNP (last 3 results) No results for input(s): PROBNP in the last 8760 hours. HbA1C: No results for input(s): HGBA1C in the last 72 hours. CBG: Recent Labs  Lab 11/29/20 1139 11/29/20 1650 11/29/20 2129 11/30/20 0733 11/30/20 1146  GLUCAP 161* 168* 155* 143* 130*    Lipid Profile: No results for input(s): CHOL, HDL, LDLCALC, TRIG, CHOLHDL, LDLDIRECT in the last 72 hours. Thyroid Function Tests: No results for input(s): TSH, T4TOTAL, FREET4, T3FREE, THYROIDAB in the last 72 hours. Anemia Panel: No results for input(s): VITAMINB12, FOLATE, FERRITIN, TIBC, IRON, RETICCTPCT in the last 72 hours. Sepsis Labs: No results for input(s): PROCALCITON, LATICACIDVEN in the last 168 hours.  No results found for this or any previous visit (from the past 240 hour(s)).   RN Pressure Injury Documentation: Pressure Injury 11/18/20 Heel Right;Posterior Deep Tissue Pressure Injury - Purple or maroon  localized area of discolored intact skin or blood-filled blister due to damage of underlying soft tissue from pressure and/or shear. (Active)  11/18/20 2300  Location: Heel  Location Orientation: Right;Posterior  Staging: Deep Tissue Pressure Injury - Purple or maroon localized area of discolored intact skin or blood-filled blister due to damage of underlying soft tissue from pressure and/or shear.  Wound Description (Comments):   Present on Admission: Yes     Pressure Injury 11/25/20 Heel Left Deep Tissue Pressure Injury - Purple or maroon localized area of discolored intact skin or blood-filled blister due to damage of underlying soft tissue from pressure and/or shear. (Active)  11/25/20 2000  Location: Heel  Location Orientation: Left  Staging: Deep Tissue Pressure Injury - Purple or maroon localized area of discolored intact skin or blood-filled blister due to damage of underlying soft tissue from pressure and/or shear.  Wound Description (Comments):   Present on Admission:      Estimated body mass index is 42.84 kg/m as calculated from the following:   Height as of this encounter: 5\' 8"  (1.727 m).   Weight as of this encounter: 127.8 kg.  Malnutrition Type:  Malnutrition Characteristics:   Nutrition Interventions:   Radiology Studies: DG Abd 1 View  Result Date: 11/30/2020 CLINICAL DATA:  Abdominal pain EXAM: ABDOMEN - 1 VIEW COMPARISON:  11/29/2020 FINDINGS: Persistent diffuse gaseous distention of the colon, similar prior exam. There are likely some distended loops of small bowel as well. IMPRESSION: Persistent diffuse gaseous distention of the colon, similar to prior exam. Electronically Signed   By: Maurine Simmering M.D.   On: 11/30/2020 09:17   DG Abd 1 View  Result Date: 11/29/2020 CLINICAL DATA:  Ileus EXAM: ABDOMEN - 1 VIEW COMPARISON:  11/28/2020 FINDINGS: Enteric tube is no longer visualized and may be removed or retracted. Diffuse gaseous distension of the colon is  grossly similar compared to the previous exam. No gross free intraperitoneal air on AP supine views. IMPRESSION: 1. Diffuse gaseous distension of the colon is grossly similar compared to the previous exam. 2. NG tube is no longer visualized. Electronically Signed  By: Davina Poke D.O.   On: 11/29/2020 11:29    Scheduled Meds:  amLODipine  10 mg Oral Daily   aspirin  81 mg Oral Daily   atorvastatin  80 mg Oral QHS   carvedilol  12.5 mg Oral BID WC   chlorhexidine  15 mL Mouth Rinse BID   cloNIDine  0.3 mg Transdermal Weekly   empagliflozin  10 mg Oral Daily   enoxaparin (LOVENOX) injection  60 mg Subcutaneous Q24H   insulin aspart  0-6 Units Subcutaneous TID WC   insulin glargine-yfgn  5 Units Subcutaneous Daily   isosorbide-hydrALAZINE  1 tablet Oral TID   mouth rinse  15 mL Mouth Rinse q12n4p   polyethylene glycol  17 g Oral BID   psyllium  1 packet Oral BID   sertraline  50 mg Oral Daily   tamsulosin  0.8 mg Oral Daily   Continuous Infusions:  sodium chloride     dextrose 5 % with KCl 20 mEq / L 20 mEq (11/30/20 0953)    LOS: 11 days   Kerney Elbe, DO [facility] PAGER is on AMION  If 7PM-7AM, please contact night-coverage [email or web address]

## 2020-12-01 ENCOUNTER — Inpatient Hospital Stay (HOSPITAL_COMMUNITY): Payer: Medicare HMO

## 2020-12-01 DIAGNOSIS — N400 Enlarged prostate without lower urinary tract symptoms: Secondary | ICD-10-CM | POA: Diagnosis not present

## 2020-12-01 DIAGNOSIS — R1084 Generalized abdominal pain: Secondary | ICD-10-CM | POA: Diagnosis not present

## 2020-12-01 DIAGNOSIS — N179 Acute kidney failure, unspecified: Secondary | ICD-10-CM | POA: Diagnosis not present

## 2020-12-01 DIAGNOSIS — F32A Depression, unspecified: Secondary | ICD-10-CM | POA: Diagnosis not present

## 2020-12-01 LAB — CBC WITH DIFFERENTIAL/PLATELET
Abs Immature Granulocytes: 0.02 10*3/uL (ref 0.00–0.07)
Basophils Absolute: 0 10*3/uL (ref 0.0–0.1)
Basophils Relative: 1 %
Eosinophils Absolute: 0.1 10*3/uL (ref 0.0–0.5)
Eosinophils Relative: 3 %
HCT: 27.5 % — ABNORMAL LOW (ref 39.0–52.0)
Hemoglobin: 8.8 g/dL — ABNORMAL LOW (ref 13.0–17.0)
Immature Granulocytes: 1 %
Lymphocytes Relative: 23 %
Lymphs Abs: 1 10*3/uL (ref 0.7–4.0)
MCH: 25.2 pg — ABNORMAL LOW (ref 26.0–34.0)
MCHC: 32 g/dL (ref 30.0–36.0)
MCV: 78.8 fL — ABNORMAL LOW (ref 80.0–100.0)
Monocytes Absolute: 0.6 10*3/uL (ref 0.1–1.0)
Monocytes Relative: 15 %
Neutro Abs: 2.5 10*3/uL (ref 1.7–7.7)
Neutrophils Relative %: 57 %
Platelets: 179 10*3/uL (ref 150–400)
RBC: 3.49 MIL/uL — ABNORMAL LOW (ref 4.22–5.81)
RDW: 16.3 % — ABNORMAL HIGH (ref 11.5–15.5)
WBC: 4.3 10*3/uL (ref 4.0–10.5)
nRBC: 0 % (ref 0.0–0.2)

## 2020-12-01 LAB — COMPREHENSIVE METABOLIC PANEL
ALT: 19 U/L (ref 0–44)
AST: 23 U/L (ref 15–41)
Albumin: 2.7 g/dL — ABNORMAL LOW (ref 3.5–5.0)
Alkaline Phosphatase: 205 U/L — ABNORMAL HIGH (ref 38–126)
Anion gap: 7 (ref 5–15)
BUN: 20 mg/dL (ref 6–20)
CO2: 25 mmol/L (ref 22–32)
Calcium: 8.6 mg/dL — ABNORMAL LOW (ref 8.9–10.3)
Chloride: 107 mmol/L (ref 98–111)
Creatinine, Ser: 1.31 mg/dL — ABNORMAL HIGH (ref 0.61–1.24)
GFR, Estimated: >60 mL/min (ref >60)
Glucose, Bld: 143 mg/dL — ABNORMAL HIGH (ref 70–99)
Potassium: 4.2 mmol/L (ref 3.5–5.1)
Sodium: 139 mmol/L (ref 135–145)
Total Bilirubin: 0.7 mg/dL (ref 0.3–1.2)
Total Protein: 6.5 g/dL (ref 6.5–8.1)

## 2020-12-01 LAB — GLUCOSE, CAPILLARY
Glucose-Capillary: 137 mg/dL — ABNORMAL HIGH (ref 70–99)
Glucose-Capillary: 170 mg/dL — ABNORMAL HIGH (ref 70–99)
Glucose-Capillary: 157 mg/dL — ABNORMAL HIGH (ref 70–99)
Glucose-Capillary: 131 mg/dL — ABNORMAL HIGH (ref 70–99)

## 2020-12-01 LAB — MAGNESIUM: Magnesium: 2 mg/dL (ref 1.7–2.4)

## 2020-12-01 LAB — PHOSPHORUS: Phosphorus: 3.3 mg/dL (ref 2.5–4.6)

## 2020-12-01 MED ORDER — KETOROLAC TROMETHAMINE 15 MG/ML IJ SOLN
15.0000 mg | Freq: Once | INTRAMUSCULAR | Status: AC
  Administered 2020-12-01: 15 mg via INTRAVENOUS
  Filled 2020-12-01: qty 1

## 2020-12-01 NOTE — Plan of Care (Signed)

## 2020-12-01 NOTE — Progress Notes (Signed)
PROGRESS NOTE    [name]  MCN:[id number] DOB: Jun 15, 1960 DOA: 11/18/2020 PCP: [name], NP   Brief Narrative:  The patient is a 60 year old chronically ill-appearing African-American morbidly obese male with a past medical history significant for but not limited to heart failure with preserved ejection fraction, hypertension, history of CVA, history of type 2 diabetes mellitus with neuropathy, CKD stage IIIa, polysubstance abuse, chronic pain, depression, hyperlipidemia as well as other comorbidities who presented with worsening abdominal pain.  He was found to have significant gastric distention in the colon on imaging and general surgery and GI were following.  Of note the patient has had bowel decompression in 2021 with Dr. Alessandra Bevels.  He decompression on 11/19/2020 without significant improvement.  GI also recommended NG tube for 11/19/2020 which eventually was removed on 11/28/2020.  He continues to be very slow to improve and currently has rectal tube in place and is on MiraLAX.  He currently remains hungry wants to eat solid food but GI is recommending continuing a liquid diet as they are elected to advance patient's diet to regular food as he does not have much improvement in his abdominal x-ray.  He cannot be mobilized due to his bedbound status and this adds a challenge of colonic ileus.  GI recommends continuing full liquid diet for now and potassium to be kept overnight for magnesium over 2 and continue MiraLAX twice daily as well as Metamucil twice a day and repeating a abdominal x-ray in the a.m.  Repeat KUB not officially read yet but continues to show persistent gaseous distention of the colon similar to prior exam.  GI recommends continuing full liquid diet today as well as continuing electrolyte supplementation and continuing MiraLAX twice daily and Metamucil twice daily.  Daily recommended advancing his diet when he has resolution of his nausea and abdominal x-ray is more  reassuring improved  Assessment & Plan:   Active Problems:   Essential hypertension   Diabetes mellitus type 2, uncontrolled   Hypokalemia   Grade I diastolic dysfunction   Ileus (HCC)   Ogilvie syndrome   Depression   Acute-on-chronic kidney injury ([facility])   BPH (benign prostatic hyperplasia)   Pressure injury of skin   Abdominal pain, generalized   Ogilvie syndrome/colonic pseudoobstruction -Slow to improve -Is admitted to the hospital with Ogilvie syndrome -GI has been consulted and following the patient -Due to his abdominal distention and nausea he had an NG tube placed which was removed on 05/2020 -He underwent a colonoscopy with decompression 11/19/2020 with minimal improvement -He has had a rectal tube in place since 11/24/2020 as well as been placed on twice daily MiraLAX -General surgery has been consulted but did not recommend any surgical intervention at this time -He appears to be improving very slowly and he is increased output from his rectal tube -KUB without significant improvement as he continues to show diffuse gaseous distention of the colon with no free intraperitoneal air clinically looks a little bit better -I spoke with GI and they recommended against narcotics -NG tube was discontinued yesterday and GI recommending allowing for full liquid diet -GI recommending not advancing past a full liquid diet and keeping his electrolytes with magnesium above 2 and a potassium above 4 as well as continuing MiraLAX twice daily dosing as well as adding Metamucil twice daily and repeating another KUB in the morning; official KUB read is still pending  Morbid Obesity -Complicates overall prognosis and Care -Estimated body mass index is 42.84 kg/m  as calculated from the following:   Height as of this encounter: 5\' 8"  (1.727 m).   Weight as of this encounter: 127.8 kg. -Weight Loss and Dietary Counseling given   Hypokalemia -Potassium has been improving and today was 4.2  again but will continue to replete have a goal above 4 -He has a poor p.o. intake and continues to have diarrhea so we will need to monitor carefully -Continue monitoring magnesium level as well -Repeat CMP in a.m.  AKI on CKD stage IIIa -Improving with IV fluid hydration -He has a baseline creatinine of 1.2; BUN/creatinine slightly went up and was 22/1.46 the day before yesterday and yesterday it was 23/1.45 and today it is 20/1.31 -He is currently on D5 with KCl 20 mEq at 50 MLS per hour but we added normal saline into the fluids yesterday -Avoid further nephrotoxic medications, contrast dyes, hypotension renally dose medication -Repeat CMP in a.m.  Heart failure with preserved ejection fraction chronic diastolic CHF currently not in exacerbation -Last echocardiogram done was in 07/2018 with EF of 65 to 70% and grade 2 diastolic dysfunction with a small pericardial effusion and mild aortic valve stenosis -He continues remain on Coreg, BiDil and amlodipine -We will need to resume his Entresto in the next day or so along with discontinuation of clonidine blood pressure allows and if he is able to consistently take p.o. -Patient is -1.220 Liters since admission -Continue monitor for signs and symptoms of volume overload as he is currently being rehydrated with D5 normal saline +20 mEq of KCl at 50 MLS per hour  Diabetes mellitus type 2 -Continue sliding scale insulin -His CBGs were okay but he is n.p.o. but now they are starting to rise but -We will add back his Lantus at a lower dose and resume his Jardiance -We will need to continue monitor blood sugars per protocol and CBGs have been ranging from 123-170  Microcytic anemia/anemia chronic kidney disease -Hemoglobin/hematocrit went from 9.3/29.3 -> 8.9/27.9 -> 9.2/28.9 -> 8.8/27.5 -Check anemia panel in the the a.m. -Continue to monitor for signs and symptoms of bleeding; currently no overt bleeding -Repeat CBC in  a.m.  Thrombocytopenia -Mild and improved. Last Platelet Count was 179 -Continue to monitor for signs and symptoms of bleeding; currently no overt bleeding noted -Repeat CBC in a.m.  History of CAD -His aspirin 81 mg p.o. daily has been resumed along with his atorvastatin 80 mg p.o. nightly as well as carvedilol 12.5 mg p.o. twice daily and also continue with isosorbide-hydralazine 1 tab p.o. 3 times daily  Depression and Anxiety -Continue with his home Sertraline 50 mg p.o. daily  Hyponatremia -Mild.  Patient's sodium went from 137 is now trended down to 131 but is now improved to 139 -Likely drop in the setting of D5 so fluids were changed to as above -Continue monitor and trend and repeat CMP in a.m.  BPH -Continue with tamsulosin 0.8 mg p.o. daily -Continue bladder scan and if necessary will need to reinsert Foley catheter as it has been removed  DVT prophylaxis: Enoxaparin 60 mg subcu every 24 Code Status: FULL CODE  Family Communication: No family currently at bedside Disposition Plan: Pending further clinical improvement and tolerance of diet and clearance by gastroenterology  Status is: Inpatient  Remains inpatient appropriate because:Unsafe d/c plan, IV treatments appropriate due to intensity of illness or inability to take PO, and Inpatient level of care appropriate due to severity of illness  Dispo: The patient is from: Home  Anticipated d/c is to:  TBD              Patient currently is not medically stable to d/c.   Difficult to place patient No  Consultants:  General surgery Gastroenterology  Procedures: Colonoscopy  Antimicrobials:  Anti-infectives (From admission, onward)    None        Subjective: Seen and examined at bedside and he states that he was in a lot of pain this morning from just laying in bed.  Has some abdominal pain as well.  Wanting to rest.  Still continues to have abdominal distention and his rectal tube is draining  yellow stools.  Denies any other concerns or complaints at this time but was asking about his pain regimen asking for his home Vicodin to be resumed.  Objective: Vitals:   11/30/20 0510 11/30/20 1307 11/30/20 2140 12/01/20 1114  BP: 136/84 139/82 130/75 138/83  Pulse: 78 81 78 78  Resp: 16 16 18 18   Temp: 98.6 F (37 C) 98.6 F (37 C) 99 F (37.2 C) 98 F (36.7 C)  TempSrc: Oral Oral Oral Axillary  SpO2: 98% 98% 98% 99%  Weight:      Height:        Intake/Output Summary (Last 24 hours) at 12/01/2020 1434 Last data filed at 12/01/2020 1254 Gross per 24 hour  Intake 2000.15 ml  Output 2650 ml  Net -649.85 ml    Filed Weights   11/19/20 1626  Weight: 127.8 kg   Examination: Physical Exam:  Constitutional: WN/WD morbidly obese African-American male, in mild distress appears uncomfortable Eyes:  Lids and conjunctivae normal, sclerae anicteric  ENMT: External Ears, Nose appear normal. Grossly normal hearing. Neck: Appears normal, supple, no cervical masses, normal ROM, no appreciable thyromegaly; no appreciable JVD Respiratory: Slightly diminished to auscultation bilaterally with coarse breath sounds, no wheezing, rales, rhonchi or crackles. Normal respiratory effort and patient is not tachypenic. No accessory muscle use.  Unlabored breathing and not wearing supplemental oxygen via nasal cannula Cardiovascular: RRR, no murmurs / rubs / gallops. S1 and S2 auscultated.  Slight lower extremity edema Abdomen: Soft, a little tender to palpate again, distended secondary body habitus. Bowel sounds positive but appeared a little bit more hypoactive today.  GU: Deferred. Musculoskeletal: No clubbing / cyanosis of digits/nails.  Bilateral lower extremities are in Prevalon boots Skin: No rashes, lesions, ulcers on limited skin evaluation. No induration; Warm and dry.  Neurologic: CN 2-12 grossly intact with no focal deficits. Romberg sign and cerebellar reflexes not assessed.  Psychiatric:  Normal judgment and insight. Alert and oriented x 3.  Anxious mood and appropriate affect.   Data Reviewed: I have personally reviewed following labs and imaging studies  CBC: Recent Labs  Lab 11/27/20 0345 11/28/20 0353 11/29/20 0755 11/30/20 1015 12/01/20 0837  WBC 5.1 4.4 3.6* 4.9 4.3  NEUTROABS  --   --   --  2.8 2.5  HGB 9.2* 9.3* 8.9* 9.2* 8.8*  HCT 29.0* 29.3* 27.9* 28.9* 27.5*  MCV 79.9* 79.0* 79.7* 78.5* 78.8*  PLT 149* 152 146* 182 810    Basic Metabolic Panel: Recent Labs  Lab 11/26/20 0407 11/27/20 0345 11/28/20 0353 11/29/20 0755 11/30/20 1015 12/01/20 0837  NA 141 132* 137 137 131* 139  K 3.2* 3.3* 3.8 3.9 4.2 4.2  CL 99 95* 101 101 99 107  CO2 32 30 29 28 27 25   GLUCOSE 231* 144* 175* 143* 145* 143*  BUN 32* 30* 27* 22* 23* 20  CREATININE 1.54* 1.58* 1.34* 1.46* 1.45* 1.31*  CALCIUM 8.5* 7.9* 8.3* 8.1* 8.3* 8.6*  MG 2.0  --   --  2.0 2.0 2.0  PHOS 2.8  --   --   --  3.7 3.3    GFR: Estimated Creatinine Clearance: 79.2 mL/min (A) (by C-G formula based on SCr of 1.31 mg/dL (H)). Liver Function Tests: Recent Labs  Lab 11/29/20 0755 11/30/20 1015 12/01/20 0837  AST 35 14* 23  ALT 28 18 19   ALKPHOS 166* 160* 205*  BILITOT 0.6 0.8 0.7  PROT 6.0* 6.3* 6.5  ALBUMIN 2.6* 2.9* 2.7*    No results for input(s): LIPASE, AMYLASE in the last 168 hours. No results for input(s): AMMONIA in the last 168 hours. Coagulation Profile: No results for input(s): INR, PROTIME in the last 168 hours. Cardiac Enzymes: No results for input(s): CKTOTAL, CKMB, CKMBINDEX, TROPONINI in the last 168 hours. BNP (last 3 results) No results for input(s): PROBNP in the last 8760 hours. HbA1C: No results for input(s): HGBA1C in the last 72 hours. CBG: Recent Labs  Lab 11/30/20 1146 11/30/20 1738 11/30/20 2138 12/01/20 0734 12/01/20 1113  GLUCAP 130* 123* 167* 137* 170*    Lipid Profile: No results for input(s): CHOL, HDL, LDLCALC, TRIG, CHOLHDL, LDLDIRECT in the  last 72 hours. Thyroid Function Tests: No results for input(s): TSH, T4TOTAL, FREET4, T3FREE, THYROIDAB in the last 72 hours. Anemia Panel: No results for input(s): VITAMINB12, FOLATE, FERRITIN, TIBC, IRON, RETICCTPCT in the last 72 hours. Sepsis Labs: No results for input(s): PROCALCITON, LATICACIDVEN in the last 168 hours.  No results found for this or any previous visit (from the past 240 hour(s)).   RN Pressure Injury Documentation: Pressure Injury 11/18/20 Heel Right;Posterior Deep Tissue Pressure Injury - Purple or maroon localized area of discolored intact skin or blood-filled blister due to damage of underlying soft tissue from pressure and/or shear. (Active)  11/18/20 2300  Location: Heel  Location Orientation: Right;Posterior  Staging: Deep Tissue Pressure Injury - Purple or maroon localized area of discolored intact skin or blood-filled blister due to damage of underlying soft tissue from pressure and/or shear.  Wound Description (Comments):   Present on Admission: Yes     Pressure Injury 11/25/20 Heel Left Deep Tissue Pressure Injury - Purple or maroon localized area of discolored intact skin or blood-filled blister due to damage of underlying soft tissue from pressure and/or shear. (Active)  11/25/20 2000  Location: Heel  Location Orientation: Left  Staging: Deep Tissue Pressure Injury - Purple or maroon localized area of discolored intact skin or blood-filled blister due to damage of underlying soft tissue from pressure and/or shear.  Wound Description (Comments):   Present on Admission:      Estimated body mass index is 42.84 kg/m as calculated from the following:   Height as of this encounter: 5\' 8"  (1.727 m).   Weight as of this encounter: 127.8 kg.  Malnutrition Type:  Malnutrition Characteristics:   Nutrition Interventions:   Radiology Studies: DG Abd 1 View  Result Date: 11/30/2020 CLINICAL DATA:  Abdominal pain EXAM: ABDOMEN - 1 VIEW COMPARISON:   11/29/2020 FINDINGS: Persistent diffuse gaseous distention of the colon, similar prior exam. There are likely some distended loops of small bowel as well. IMPRESSION: Persistent diffuse gaseous distention of the colon, similar to prior exam. Electronically Signed   By: Maurine Simmering M.D.   On: 11/30/2020 09:17    Scheduled Meds:  amLODipine  10 mg Oral Daily   aspirin  81 mg Oral Daily   atorvastatin  80 mg Oral QHS   carvedilol  12.5 mg Oral BID WC   chlorhexidine  15 mL Mouth Rinse BID   cloNIDine  0.3 mg Transdermal Weekly   empagliflozin  10 mg Oral Daily   enoxaparin (LOVENOX) injection  60 mg Subcutaneous Q24H   insulin aspart  0-6 Units Subcutaneous TID WC   insulin glargine-yfgn  5 Units Subcutaneous Daily   isosorbide-hydrALAZINE  1 tablet Oral TID   ketorolac  15 mg Intravenous Once   mouth rinse  15 mL Mouth Rinse q12n4p   polyethylene glycol  17 g Oral BID   psyllium  1 packet Oral BID   sertraline  50 mg Oral Daily   tamsulosin  0.8 mg Oral Daily   Continuous Infusions:  sodium chloride     dextrose 5 % and 0.9 % NaCl with KCl 20 mEq/L 50 mL/hr at 12/01/20 0700    LOS: 12 days   Kerney Elbe, DO [facility] PAGER is on AMION  If 7PM-7AM, please contact night-coverage [email or web address]

## 2020-12-01 NOTE — Progress Notes (Signed)
[facility] Gastroenterology Progress Note  [name] 60 y.o. Oct 17, 1960  CC:  Carita Pian syndrome/colonic pseudo-obstruction s/p colonic decompression   Subjective: Patient states he isn't feeling well today. States this morning he became very nauseous with a small amount of nonbloody emesis. Denies abdominal pain. States he is tolerating soup well, even though he is sick of it. Has not had rectal tube output since yesterday around 10am.  ROS : Review of Systems  Gastrointestinal:  Positive for nausea and vomiting. Negative for abdominal pain, blood in stool, constipation, diarrhea, heartburn and melena.     Objective: Vital signs in last 24 hours: Vitals:   11/30/20 1307 11/30/20 2140  BP: 139/82 130/75  Pulse: 81 78  Resp: 16 18  Temp: 98.6 F (37 C) 99 F (37.2 C)  SpO2: 98% 98%    Physical Exam:  General:  Alert, cooperative, no distress  Head:  Normocephalic, without obvious abnormality, atraumatic  Eyes:  Anicteric sclera, EOM's intact  Lungs:   Clear to auscultation bilaterally, respirations unlabored  Heart:  Regular rate and rhythm, S1, S2 normal  Abdomen:   Soft, non-tender, bowel sounds active all four quadrants,  no masses,     Lab Results: Recent Labs    11/29/20 0755 11/30/20 1015  NA 137 131*  K 3.9 4.2  CL 101 99  CO2 28 27  GLUCOSE 143* 145*  BUN 22* 23*  CREATININE 1.46* 1.45*  CALCIUM 8.1* 8.3*  MG 2.0 2.0  PHOS  --  3.7   Recent Labs    11/29/20 0755 11/30/20 1015  AST 35 14*  ALT 28 18  ALKPHOS 166* 160*  BILITOT 0.6 0.8  PROT 6.0* 6.3*  ALBUMIN 2.6* 2.9*   Recent Labs    11/29/20 0755 11/30/20 1015  WBC 3.6* 4.9  NEUTROABS  --  2.8  HGB 8.9* 9.2*  HCT 27.9* 28.9*  MCV 79.7* 78.5*  PLT 146* 182   No results for input(s): LABPROT, INR in the last 72 hours.    Assessment Ogilvie syndrome/colonic pseudo-obstruction s/p colonic decompression 9/25 with persistent/recurrent of symptoms -Repeat abdominal x-ray 10/6: Persistent  diffuse gaseous distention of the colon, similar to prior exam. - Magnesium 2.0, Phosphorus 3.7 - Potassium 4.2   Plan: With patient's nausea, may need to consider going back to clear liquids. But for now, can do full liquids as tolerate. No Improvement in abdominal xray, will get repeat Continue same treatment - Miralax BID and metamucil BID  [facility] will follow  Garnette Scheuermann PA-C 12/01/2020, 8:23 AM  Contact #  [phone]

## 2020-12-02 ENCOUNTER — Inpatient Hospital Stay (HOSPITAL_COMMUNITY): Payer: Medicare HMO

## 2020-12-02 DIAGNOSIS — N179 Acute kidney failure, unspecified: Secondary | ICD-10-CM | POA: Diagnosis not present

## 2020-12-02 DIAGNOSIS — R1084 Generalized abdominal pain: Secondary | ICD-10-CM | POA: Diagnosis not present

## 2020-12-02 DIAGNOSIS — F32A Depression, unspecified: Secondary | ICD-10-CM | POA: Diagnosis not present

## 2020-12-02 DIAGNOSIS — N400 Enlarged prostate without lower urinary tract symptoms: Secondary | ICD-10-CM | POA: Diagnosis not present

## 2020-12-02 LAB — CBC WITH DIFFERENTIAL/PLATELET
Abs Immature Granulocytes: 0.01 10*3/uL (ref 0.00–0.07)
Basophils Absolute: 0 10*3/uL (ref 0.0–0.1)
Basophils Relative: 0 %
Eosinophils Absolute: 0.1 10*3/uL (ref 0.0–0.5)
Eosinophils Relative: 2 %
HCT: 24.8 % — ABNORMAL LOW (ref 39.0–52.0)
Hemoglobin: 8 g/dL — ABNORMAL LOW (ref 13.0–17.0)
Immature Granulocytes: 0 %
Lymphocytes Relative: 23 %
Lymphs Abs: 0.9 10*3/uL (ref 0.7–4.0)
MCH: 25.2 pg — ABNORMAL LOW (ref 26.0–34.0)
MCHC: 32.3 g/dL (ref 30.0–36.0)
MCV: 78.2 fL — ABNORMAL LOW (ref 80.0–100.0)
Monocytes Absolute: 0.7 10*3/uL (ref 0.1–1.0)
Monocytes Relative: 20 %
Neutro Abs: 2.1 10*3/uL (ref 1.7–7.7)
Neutrophils Relative %: 55 %
Platelets: 189 10*3/uL (ref 150–400)
RBC: 3.17 MIL/uL — ABNORMAL LOW (ref 4.22–5.81)
RDW: 16.2 % — ABNORMAL HIGH (ref 11.5–15.5)
WBC: 3.8 10*3/uL — ABNORMAL LOW (ref 4.0–10.5)
nRBC: 0 % (ref 0.0–0.2)

## 2020-12-02 LAB — COMPREHENSIVE METABOLIC PANEL
ALT: 12 U/L (ref 0–44)
AST: 11 U/L — ABNORMAL LOW (ref 15–41)
Albumin: 2.6 g/dL — ABNORMAL LOW (ref 3.5–5.0)
Alkaline Phosphatase: 149 U/L — ABNORMAL HIGH (ref 38–126)
Anion gap: 5 (ref 5–15)
BUN: 20 mg/dL (ref 6–20)
CO2: 23 mmol/L (ref 22–32)
Calcium: 8 mg/dL — ABNORMAL LOW (ref 8.9–10.3)
Chloride: 105 mmol/L (ref 98–111)
Creatinine, Ser: 1.5 mg/dL — ABNORMAL HIGH (ref 0.61–1.24)
GFR, Estimated: 53 mL/min — ABNORMAL LOW (ref >60)
Glucose, Bld: 158 mg/dL — ABNORMAL HIGH (ref 70–99)
Potassium: 3.8 mmol/L (ref 3.5–5.1)
Sodium: 133 mmol/L — ABNORMAL LOW (ref 135–145)
Total Bilirubin: 0.8 mg/dL (ref 0.3–1.2)
Total Protein: 6 g/dL — ABNORMAL LOW (ref 6.5–8.1)

## 2020-12-02 LAB — MAGNESIUM: Magnesium: 1.8 mg/dL (ref 1.7–2.4)

## 2020-12-02 LAB — GLUCOSE, CAPILLARY
Glucose-Capillary: 157 mg/dL — ABNORMAL HIGH (ref 70–99)
Glucose-Capillary: 131 mg/dL — ABNORMAL HIGH (ref 70–99)
Glucose-Capillary: 135 mg/dL — ABNORMAL HIGH (ref 70–99)

## 2020-12-02 LAB — PHOSPHORUS: Phosphorus: 3.3 mg/dL (ref 2.5–4.6)

## 2020-12-02 MED ORDER — POTASSIUM CHLORIDE 10 MEQ/100ML IV SOLN
10.0000 meq | INTRAVENOUS | Status: AC
Start: 2020-12-02 — End: 2020-12-02
  Administered 2020-12-02 (×4): 10 meq via INTRAVENOUS
  Filled 2020-12-02 (×4): qty 100

## 2020-12-02 MED ORDER — MAGNESIUM SULFATE 2 GM/50ML IV SOLN
2.0000 g | Freq: Once | INTRAVENOUS | Status: AC
  Administered 2020-12-02: 2 g via INTRAVENOUS
  Filled 2020-12-02: qty 50

## 2020-12-02 NOTE — Plan of Care (Signed)
  Problem: Education: Goal: Knowledge of General Education information will improve Description: Including pain rating scale, medication(s)/side effects and non-pharmacologic comfort measures Outcome: Progressing   Problem: Clinical Measurements: Goal: Diagnostic test results will improve Outcome: Progressing   Problem: Nutrition: Goal: Adequate nutrition will be maintained Outcome: Progressing   

## 2020-12-02 NOTE — Progress Notes (Signed)
PROGRESS NOTE    [name]  QAS:[id number] DOB: 07-Feb-1961 DOA: 11/18/2020 PCP: [name], NP   Brief Narrative:  The patient is a 60 year old chronically ill-appearing African-American morbidly obese male with a past medical history significant for but not limited to heart failure with preserved ejection fraction, hypertension, history of CVA, history of type 2 diabetes mellitus with neuropathy, CKD stage IIIa, polysubstance abuse, chronic pain, depression, hyperlipidemia as well as other comorbidities who presented with worsening abdominal pain.  He was found to have significant gastric distention in the colon on imaging and general surgery and GI were following.  Of note the patient has had bowel decompression in 2021 with Dr. Alessandra Bevels.  He decompression on 11/19/2020 without significant improvement.  GI also recommended NG tube for 11/19/2020 which eventually was removed on 11/28/2020.  He continues to be very slow to improve and currently has rectal tube in place and is on MiraLAX.  He currently remains hungry wants to eat solid food but GI is recommending continuing a liquid diet as they are elected to advance patient's diet to regular food as he does not have much improvement in his abdominal x-ray.  He cannot be mobilized due to his bedbound status and this adds a challenge of colonic ileus.  GI recommends continuing full liquid diet for now and potassium to be kept overnight for magnesium over 2 and continue MiraLAX twice daily as well as Metamucil twice a day and repeating a abdominal x-ray in the a.m.  Repeat KUB today showed "Marked diffuse gaseous distention of the colon, most prominent in the transverse and sigmoid colon, worsened. No definite  small bowel dilatation. Findings suggest worsening adynamic ileus."  GI recommends changing to a clear liquid diet and keeping the patient n.p.o. at midnight as the plan is for repeat colonoscopy for decompression in the  morning.  Assessment & Plan:   Active Problems:   Essential hypertension   Diabetes mellitus type 2, uncontrolled   Hypokalemia   Grade I diastolic dysfunction   Ileus (HCC)   Ogilvie syndrome   Depression   Acute-on-chronic kidney injury ([place])   BPH (benign prostatic hyperplasia)   Pressure injury of skin   Abdominal pain, generalized   Ogilvie syndrome/colonic pseudoobstruction -Slow to improve -Is admitted to the hospital with Ogilvie syndrome -GI has been consulted and following the patient -Due to his abdominal distention and nausea he had an NG tube placed which was removed on 05/2020 -He underwent a colonoscopy with decompression 11/19/2020 with minimal improvement -He has had a rectal tube in place since 11/24/2020 as well as been placed on twice daily MiraLAX -General surgery has been consulted but did not recommend any surgical intervention at this time -He appears to be improving very slowly and he is increased output from his rectal tube -KUB  showing worsening as "Marked diffuse gaseous distention of the colon, most prominent in the transverse and sigmoid colon, worsened. No definite small bowel dilatation. Findings suggest worsening adynamic ileus." -I spoke with GI and they recommended against narcotics -NG tube has been discontinued and GI was recommending full liquid diet but now: Back down to clinical diet and was recommended n.p.o. at midnight as the plan is to repeat colonoscopy for decompression -Continue gentle IV fluids with normal saline -GI recommending keeping his electrolytes with magnesium above 2 and a potassium above 4 as well as continuing MiraLAX twice daily dosing as well as adding Metamucil twice daily and repeating another KUB in the morning;  official KUB read is still pending  Morbid Obesity -Complicates overall prognosis and Care -Estimated body mass index is 42.84 kg/m as calculated from the following:   Height as of this encounter: 5\' 8"  (1.727  m).   Weight as of this encounter: 127.8 kg. -Weight Loss and Dietary Counseling given   Hypokalemia -Potassium has been improving and today was 3.8 again but will continue to replete have a goal above 4 -Replete with IV Kcl 40 mEQ x1 -Mag Level was 1.8 so will replete with IV Mag Sulfate 2 grams -He has a poor p.o. intake and continues to have diarrhea so we will need to monitor carefully -Continue monitoring magnesium level as well -Repeat CMP in a.m.  AKI on CKD stage IIIa -Improving with IV fluid hydration -He has a baseline creatinine of 1.2; BUN/creatinine slightly went up and was 22/1.4 -> 23/1.45 -> 20/1.31 -> 20/1.50 -He is currently on D5 NS with KCl 20 mEq at 50 MLS per hour  -Avoid further nephrotoxic medications, contrast dyes, hypotension renally dose medication -Repeat CMP in a.m.  Heart failure with preserved ejection fraction chronic diastolic CHF currently not in exacerbation -Last echocardiogram done was in 07/2018 with EF of 65 to 70% and grade 2 diastolic dysfunction with a small pericardial effusion and mild aortic valve stenosis -He continues remain on Coreg, BiDil and amlodipine -We will need to resume his Entresto in the next day or so along with discontinuation of clonidine blood pressure allows and if he is able to consistently take p.o. -Patient is -1.591 Liters since admission -Continue monitor for signs and symptoms of volume overload as he is currently being rehydrated with D5 normal saline +20 mEq of KCl at 50 MLS per hour  Leukopenia -Mild. WBC went from 4.3 -> 3.8 -Continue to Monitor and Trend -Repeat CBC in the AM   Diabetes Mellitus Type 2 -Continue sliding scale insulin -His CBGs were okay but he is n.p.o. but now they are starting to rise but -We will add back his Lantus at a lower dose and resume his Jardiance -We will need to continue monitor blood sugars per protocol and CBGs have been ranging from 131-170  Microcytic anemia/anemia chronic  kidney disease -Hemoglobin/hematocrit went from 9.3/29.3 -> 8.9/27.9 -> 9.2/28.9 -> 8.8/27.5 -> 8.0/24.8 -Check Anemia Panel in the the a.m. -Continue to monitor for signs and symptoms of bleeding; currently no overt bleeding -Repeat CBC in a.m.  Thrombocytopenia -Mild and improved. Last Platelet Count was 179 and increased to 189 -Continue to monitor for signs and symptoms of bleeding; currently no overt bleeding noted -Repeat CBC in a.m.  History of CAD -His aspirin 81 mg p.o. daily has been resumed along with his atorvastatin 80 mg p.o. nightly as well as carvedilol 12.5 mg p.o. twice daily and also continue with isosorbide-hydralazine 1 tab p.o. 3 times daily  Depression and Anxiety -Continue with his home Sertraline 50 mg p.o. daily  Hyponatremia -Mild.  Patient's sodium went from 137  -> 131 -> 139 -> 133 -Likely drop in the setting of D5 so fluids were changed to as above -Continue monitor and trend and repeat CMP in a.m.  BPH -Continue with Tamsulosin 0.8 mg p.o. daily -Continue bladder scan and if necessary will need to reinsert Foley catheter as it has been removed  DVT prophylaxis: Enoxaparin 60 mg subcu every 24 Code Status: FULL CODE  Family Communication: No family currently at bedside Disposition Plan: Pending further clinical improvement and tolerance of diet and clearance  by gastroenterology  Status is: Inpatient  Remains inpatient appropriate because:Unsafe d/c plan, IV treatments appropriate due to intensity of illness or inability to take PO, and Inpatient level of care appropriate due to severity of illness  Dispo: The patient is from: Home              Anticipated d/c is to:  TBD              Patient currently is not medically stable to d/c.   Difficult to place patient No  Consultants:  General surgery Gastroenterology  Procedures: Colonoscopy with Repeat Colonoscopy to be done in the AM  Antimicrobials:  Anti-infectives (From admission, onward)     None        Subjective: Seen and examined at bedside and he is resting and understands he is going to go for repeat colonoscopy tomorrow.  Did not complain of any pain.  Watching television.  No other concerns or complaints at this time.  Objective: Vitals:   12/01/20 1114 12/01/20 2025 12/02/20 0533 12/02/20 1159  BP: 138/83 114/62 134/76 (!) 163/94  Pulse: 78 72 81 81  Resp: 18 18 18 16   Temp: 98 F (36.7 C) 98.4 F (36.9 C) 99.5 F (37.5 C) 98.6 F (37 C)  TempSrc: Axillary Oral Oral Oral  SpO2: 99% 95% 99% 100%  Weight:      Height:        Intake/Output Summary (Last 24 hours) at 12/02/2020 1553 Last data filed at 12/02/2020 1502 Gross per 24 hour  Intake 1791.35 ml  Output 1850 ml  Net -58.65 ml    Filed Weights   11/19/20 1626  Weight: 127.8 kg   Examination: Physical Exam:  Constitutional: WN/WD morbidly obese African-American male who is essentially bedbound in no acute distress appears slightly uncomfortable Eyes: Lids and conjunctivae normal, sclerae anicteric  ENMT: External Ears, Nose appear normal. Grossly normal hearing.  He has poor dentition and is missing several teeth Neck: Appears normal, supple, no cervical masses, normal ROM, no appreciable thyromegaly; no JVD Respiratory: Diminished to auscultation bilaterally, no wheezing, rales, rhonchi or crackles. Normal respiratory effort and patient is not tachypenic. No accessory muscle use.  Unlabored breathing Cardiovascular: RRR, no murmurs / rubs / gallops.  Abdomen slight lower extremity edema Abdomen: Soft, tender to palpate again, significantly distended secondary to body habitus and hypertympanic. Bowel sounds positive but are hypoactive GU: Deferred. Musculoskeletal: No clubbing / cyanosis of digits/nails.  Has heels have Mepilex pad in his legs are no longer in Prevalon boots today Skin: No rashes, lesions, ulcers on limited skin evaluation. No induration; Warm and dry.  Neurologic: CN 2-12  grossly intact with no focal deficits.  Romberg sign and cerebellar reflexes not assessed.  Psychiatric: Normal judgment and insight. Alert and oriented x 3. Normal mood and appropriate affect.   Data Reviewed: I have personally reviewed following labs and imaging studies  CBC: Recent Labs  Lab 11/28/20 0353 11/29/20 0755 11/30/20 1015 12/01/20 0837 12/02/20 1328  WBC 4.4 3.6* 4.9 4.3 3.8*  NEUTROABS  --   --  2.8 2.5 2.1  HGB 9.3* 8.9* 9.2* 8.8* 8.0*  HCT 29.3* 27.9* 28.9* 27.5* 24.8*  MCV 79.0* 79.7* 78.5* 78.8* 78.2*  PLT 152 146* 182 179 245    Basic Metabolic Panel: Recent Labs  Lab 11/26/20 0407 11/27/20 0345 11/28/20 0353 11/29/20 0755 11/30/20 1015 12/01/20 0837 12/02/20 1328  NA 141   < > 137 137 131* 139 133*  K 3.2*   < >  3.8 3.9 4.2 4.2 3.8  CL 99   < > 101 101 99 107 105  CO2 32   < > 29 28 27 25 23   GLUCOSE 231*   < > 175* 143* 145* 143* 158*  BUN 32*   < > 27* 22* 23* 20 20  CREATININE 1.54*   < > 1.34* 1.46* 1.45* 1.31* 1.50*  CALCIUM 8.5*   < > 8.3* 8.1* 8.3* 8.6* 8.0*  MG 2.0  --   --  2.0 2.0 2.0 1.8  PHOS 2.8  --   --   --  3.7 3.3 3.3   < > = values in this interval not displayed.    GFR: Estimated Creatinine Clearance: 69.2 mL/min (A) (by C-G formula based on SCr of 1.5 mg/dL (H)). Liver Function Tests: Recent Labs  Lab 11/29/20 0755 11/30/20 1015 12/01/20 0837 12/02/20 1328  AST 35 14* 23 11*  ALT 28 18 19 12   ALKPHOS 166* 160* 205* 149*  BILITOT 0.6 0.8 0.7 0.8  PROT 6.0* 6.3* 6.5 6.0*  ALBUMIN 2.6* 2.9* 2.7* 2.6*    No results for input(s): LIPASE, AMYLASE in the last 168 hours. No results for input(s): AMMONIA in the last 168 hours. Coagulation Profile: No results for input(s): INR, PROTIME in the last 168 hours. Cardiac Enzymes: No results for input(s): CKTOTAL, CKMB, CKMBINDEX, TROPONINI in the last 168 hours. BNP (last 3 results) No results for input(s): PROBNP in the last 8760 hours. HbA1C: No results for input(s):  HGBA1C in the last 72 hours. CBG: Recent Labs  Lab 12/01/20 1113 12/01/20 1601 12/01/20 2019 12/02/20 0727 12/02/20 1153  GLUCAP 170* 157* 131* 157* 131*    Lipid Profile: No results for input(s): CHOL, HDL, LDLCALC, TRIG, CHOLHDL, LDLDIRECT in the last 72 hours. Thyroid Function Tests: No results for input(s): TSH, T4TOTAL, FREET4, T3FREE, THYROIDAB in the last 72 hours. Anemia Panel: No results for input(s): VITAMINB12, FOLATE, FERRITIN, TIBC, IRON, RETICCTPCT in the last 72 hours. Sepsis Labs: No results for input(s): PROCALCITON, LATICACIDVEN in the last 168 hours.  No results found for this or any previous visit (from the past 240 hour(s)).   RN Pressure Injury Documentation: Pressure Injury 11/18/20 Heel Right;Posterior Deep Tissue Pressure Injury - Purple or maroon localized area of discolored intact skin or blood-filled blister due to damage of underlying soft tissue from pressure and/or shear. (Active)  11/18/20 2300  Location: Heel  Location Orientation: Right;Posterior  Staging: Deep Tissue Pressure Injury - Purple or maroon localized area of discolored intact skin or blood-filled blister due to damage of underlying soft tissue from pressure and/or shear.  Wound Description (Comments):   Present on Admission: Yes     Pressure Injury 11/25/20 Heel Left Deep Tissue Pressure Injury - Purple or maroon localized area of discolored intact skin or blood-filled blister due to damage of underlying soft tissue from pressure and/or shear. (Active)  11/25/20 2000  Location: Heel  Location Orientation: Left  Staging: Deep Tissue Pressure Injury - Purple or maroon localized area of discolored intact skin or blood-filled blister due to damage of underlying soft tissue from pressure and/or shear.  Wound Description (Comments):   Present on Admission:      Estimated body mass index is 42.84 kg/m as calculated from the following:   Height as of this encounter: 5\' 8"  (1.727 m).    Weight as of this encounter: 127.8 kg.  Malnutrition Type:  Malnutrition Characteristics:   Nutrition Interventions:   Radiology Studies: DG Abd  1 View  Result Date: 12/02/2020 CLINICAL DATA:  Recent bowel decompression, abdominal distention EXAM: ABDOMEN - 1 VIEW COMPARISON:  12/01/2020 abdominal radiograph FINDINGS: Marked diffuse gaseous distention of the colon, most prominent in the transverse colon measuring 11.6 cm diameter, increased from 8.8 cm diameter. Marked gaseous distention of the visualized sigmoid colon. The colonic distention appears worsened. No definite pneumatosis or pneumoperitoneum. No significant small bowel dilatation. No radiodense nephrolithiasis. IMPRESSION: Marked diffuse gaseous distention of the colon, most prominent in the transverse and sigmoid colon, worsened. No definite small bowel dilatation. Findings suggest worsening adynamic ileus. Electronically Signed   By: Ilona Sorrel M.D.   On: 12/02/2020 10:21   DG Abd 1 View  Result Date: 12/01/2020 CLINICAL DATA:  Abdominal distension EXAM: ABDOMEN - 1 VIEW COMPARISON:  11/30/2020, 11/29/2020 FINDINGS: Persistent diffuse gaseous distension of the colon, grossly similar compared to the previous exam. No gross free intraperitoneal air on AP supine view. IMPRESSION: Persistent diffuse gaseous distension of the colon, grossly similar compared to the previous exam. Electronically Signed   By: Davina Poke D.O.   On: 12/01/2020 14:55    Scheduled Meds:  amLODipine  10 mg Oral Daily   aspirin  81 mg Oral Daily   atorvastatin  80 mg Oral QHS   carvedilol  12.5 mg Oral BID WC   chlorhexidine  15 mL Mouth Rinse BID   cloNIDine  0.3 mg Transdermal Weekly   empagliflozin  10 mg Oral Daily   enoxaparin (LOVENOX) injection  60 mg Subcutaneous Q24H   insulin aspart  0-6 Units Subcutaneous TID WC   insulin glargine-yfgn  5 Units Subcutaneous Daily   isosorbide-hydrALAZINE  1 tablet Oral TID   mouth rinse  15 mL Mouth  Rinse q12n4p   polyethylene glycol  17 g Oral BID   psyllium  1 packet Oral BID   sertraline  50 mg Oral Daily   tamsulosin  0.8 mg Oral Daily   Continuous Infusions:  sodium chloride     dextrose 5 % and 0.9 % NaCl with KCl 20 mEq/L 50 mL/hr at 12/02/20 0532   magnesium sulfate bolus IVPB 2 g (12/02/20 1501)   potassium chloride 10 mEq (12/02/20 1502)    LOS: 13 days   Kerney Elbe, DO [facility] PAGER is on AMION  If 7PM-7AM, please contact night-coverage [email or web address]

## 2020-12-02 NOTE — H&P (View-Only) (Signed)
Subjective: Patient denies worsening abdominal pain. Rectal tube contains liquid watery yellow stool.  Objective: Vital signs in last 24 hours: Temp:  [98.4 F (36.9 C)-99.5 F (37.5 C)] 98.6 F (37 C) (10/08 1159) Pulse Rate:  [72-81] 81 (10/08 1159) Resp:  [16-18] 16 (10/08 1159) BP: (114-163)/(62-94) 163/94 (10/08 1159) SpO2:  [95 %-100 %] 100 % (10/08 1159) Weight change:  Last BM Date: 12/01/20  PE: Chronically ill-appearing, bedbound GENERAL: Pallor, no distress, able to speak in full sentences  ABDOMEN: Distended but soft, bowel sounds audible EXTREMITIES: Bilateral lower extremities with pressure support boots  Lab Results: Results for orders placed or performed during the hospital encounter of 11/18/20 (from the past 48 hour(s))  Glucose, capillary     Status: Abnormal   Collection Time: 11/30/20  5:38 PM  Result Value Ref Range   Glucose-Capillary 123 (H) 70 - 99 mg/dL    Comment: Glucose reference range applies only to samples taken after fasting for at least 8 hours.  Glucose, capillary     Status: Abnormal   Collection Time: 11/30/20  9:38 PM  Result Value Ref Range   Glucose-Capillary 167 (H) 70 - 99 mg/dL    Comment: Glucose reference range applies only to samples taken after fasting for at least 8 hours.  Glucose, capillary     Status: Abnormal   Collection Time: 12/01/20  7:34 AM  Result Value Ref Range   Glucose-Capillary 137 (H) 70 - 99 mg/dL    Comment: Glucose reference range applies only to samples taken after fasting for at least 8 hours.  CBC with Differential/Platelet     Status: Abnormal   Collection Time: 12/01/20  8:37 AM  Result Value Ref Range   WBC 4.3 4.0 - 10.5 K/uL   RBC 3.49 (L) 4.22 - 5.81 MIL/uL   Hemoglobin 8.8 (L) 13.0 - 17.0 g/dL   HCT 27.5 (L) 39.0 - 52.0 %   MCV 78.8 (L) 80.0 - 100.0 fL   MCH 25.2 (L) 26.0 - 34.0 pg   MCHC 32.0 30.0 - 36.0 g/dL   RDW 16.3 (H) 11.5 - 15.5 %   Platelets 179 150 - 400 K/uL   nRBC 0.0 0.0 - 0.2  %   Neutrophils Relative % 57 %   Neutro Abs 2.5 1.7 - 7.7 K/uL   Lymphocytes Relative 23 %   Lymphs Abs 1.0 0.7 - 4.0 K/uL   Monocytes Relative 15 %   Monocytes Absolute 0.6 0.1 - 1.0 K/uL   Eosinophils Relative 3 %   Eosinophils Absolute 0.1 0.0 - 0.5 K/uL   Basophils Relative 1 %   Basophils Absolute 0.0 0.0 - 0.1 K/uL   Immature Granulocytes 1 %   Abs Immature Granulocytes 0.02 0.00 - 0.07 K/uL    Comment: Performed at [facility], [place] [address] [place], [place] [address]  Comprehensive metabolic panel     Status: Abnormal   Collection Time: 12/01/20  8:37 AM  Result Value Ref Range   Sodium 139 135 - 145 mmol/L    Comment: DELTA CHECK NOTED   Potassium 4.2 3.5 - 5.1 mmol/L   Chloride 107 98 - 111 mmol/L   CO2 25 22 - 32 mmol/L   Glucose, Bld 143 (H) 70 - 99 mg/dL    Comment: Glucose reference range applies only to samples taken after fasting for at least 8 hours.   BUN 20 6 - 20 mg/dL   Creatinine, Ser 1.31 (H) 0.61 - 1.24 mg/dL  Calcium 8.6 (L) 8.9 - 10.3 mg/dL   Total Protein 6.5 6.5 - 8.1 g/dL   Albumin 2.7 (L) 3.5 - 5.0 g/dL   AST 23 15 - 41 U/L   ALT 19 0 - 44 U/L   Alkaline Phosphatase 205 (H) 38 - 126 U/L   Total Bilirubin 0.7 0.3 - 1.2 mg/dL   GFR, Estimated >60 >60 mL/min    Comment: (NOTE) Calculated using the CKD-EPI Creatinine Equation (2021)    Anion gap 7 5 - 15    Comment: Performed at [facility], [place] [address] [place], [place] [address]  Magnesium     Status: None   Collection Time: 12/01/20  8:37 AM  Result Value Ref Range   Magnesium 2.0 1.7 - 2.4 mg/dL    Comment: Performed at [facility], [place] [address], [place], [place] [address]  Phosphorus     Status: None   Collection Time: 12/01/20  8:37 AM  Result Value Ref Range   Phosphorus 3.3 2.5 - 4.6 mg/dL    Comment: Performed at [facility], [place] [address], [place], [place] [address]  Glucose, capillary      Status: Abnormal   Collection Time: 12/01/20 11:13 AM  Result Value Ref Range   Glucose-Capillary 170 (H) 70 - 99 mg/dL    Comment: Glucose reference range applies only to samples taken after fasting for at least 8 hours.  Glucose, capillary     Status: Abnormal   Collection Time: 12/01/20  4:01 PM  Result Value Ref Range   Glucose-Capillary 157 (H) 70 - 99 mg/dL    Comment: Glucose reference range applies only to samples taken after fasting for at least 8 hours.  Glucose, capillary     Status: Abnormal   Collection Time: 12/01/20  8:19 PM  Result Value Ref Range   Glucose-Capillary 131 (H) 70 - 99 mg/dL    Comment: Glucose reference range applies only to samples taken after fasting for at least 8 hours.  Glucose, capillary     Status: Abnormal   Collection Time: 12/02/20  7:27 AM  Result Value Ref Range   Glucose-Capillary 157 (H) 70 - 99 mg/dL    Comment: Glucose reference range applies only to samples taken after fasting for at least 8 hours.  Glucose, capillary     Status: Abnormal   Collection Time: 12/02/20 11:53 AM  Result Value Ref Range   Glucose-Capillary 131 (H) 70 - 99 mg/dL    Comment: Glucose reference range applies only to samples taken after fasting for at least 8 hours.    Studies/Results: DG Abd 1 View  Result Date: 12/02/2020 CLINICAL DATA:  Recent bowel decompression, abdominal distention EXAM: ABDOMEN - 1 VIEW COMPARISON:  12/01/2020 abdominal radiograph FINDINGS: Marked diffuse gaseous distention of the colon, most prominent in the transverse colon measuring 11.6 cm diameter, increased from 8.8 cm diameter. Marked gaseous distention of the visualized sigmoid colon. The colonic distention appears worsened. No definite pneumatosis or pneumoperitoneum. No significant small bowel dilatation. No radiodense nephrolithiasis. IMPRESSION: Marked diffuse gaseous distention of the colon, most prominent in the transverse and sigmoid colon, worsened. No definite small bowel  dilatation. Findings suggest worsening adynamic ileus. Electronically Signed   By: Ilona Sorrel M.D.   On: 12/02/2020 10:21   DG Abd 1 View  Result Date: 12/01/2020 CLINICAL DATA:  Abdominal distension EXAM: ABDOMEN - 1 VIEW COMPARISON:  11/30/2020, 11/29/2020 FINDINGS: Persistent diffuse gaseous distension of the colon, grossly  similar compared to the previous exam. No gross free intraperitoneal air on AP supine view. IMPRESSION: Persistent diffuse gaseous distension of the colon, grossly similar compared to the previous exam. Electronically Signed   By: Davina Poke D.O.   On: 12/01/2020 14:55    Medications: I have reviewed the patient's current medications.  Assessment: Adynamic ileus-abdominal x-ray today showed diffuse gaseous distention, prominent transverse colon measuring 11.6 cm, no pneumatosis or pneumoperitoneum, worsening adynamic ileus  Potassium 4.2, magnesium 2 Microcytic anemia, hemoglobin 8.8, MCV 78.8  Plan: Abdominal imaging appears worse than clinical presentation We will switch from full liquids to clear liquid, and keep patient n.p.o. after midnight Plan repeat colonoscopy for decompression in a.m. Continue IV fluids, Metamucil twice a day and MiraLAX twice a day. Discussed the same with the patient, who is agreeable for colonoscopy in a.m. tomorrow.  Ronnette Juniper, MD 12/02/2020, 12:05 PM

## 2020-12-02 NOTE — Progress Notes (Signed)
Subjective: Patient denies worsening abdominal pain. Rectal tube contains liquid watery yellow stool.  Objective: Vital signs in last 24 hours: Temp:  [98.4 F (36.9 C)-99.5 F (37.5 C)] 98.6 F (37 C) (10/08 1159) Pulse Rate:  [72-81] 81 (10/08 1159) Resp:  [16-18] 16 (10/08 1159) BP: (114-163)/(62-94) 163/94 (10/08 1159) SpO2:  [95 %-100 %] 100 % (10/08 1159) Weight change:  Last BM Date: 12/01/20  PE: Chronically ill-appearing, bedbound GENERAL: Pallor, no distress, able to speak in full sentences  ABDOMEN: Distended but soft, bowel sounds audible EXTREMITIES: Bilateral lower extremities with pressure support boots  Lab Results: Results for orders placed or performed during the hospital encounter of 11/18/20 (from the past 48 hour(s))  Glucose, capillary     Status: Abnormal   Collection Time: 11/30/20  5:38 PM  Result Value Ref Range   Glucose-Capillary 123 (H) 70 - 99 mg/dL    Comment: Glucose reference range applies only to samples taken after fasting for at least 8 hours.  Glucose, capillary     Status: Abnormal   Collection Time: 11/30/20  9:38 PM  Result Value Ref Range   Glucose-Capillary 167 (H) 70 - 99 mg/dL    Comment: Glucose reference range applies only to samples taken after fasting for at least 8 hours.  Glucose, capillary     Status: Abnormal   Collection Time: 12/01/20  7:34 AM  Result Value Ref Range   Glucose-Capillary 137 (H) 70 - 99 mg/dL    Comment: Glucose reference range applies only to samples taken after fasting for at least 8 hours.  CBC with Differential/Platelet     Status: Abnormal   Collection Time: 12/01/20  8:37 AM  Result Value Ref Range   WBC 4.3 4.0 - 10.5 K/uL   RBC 3.49 (L) 4.22 - 5.81 MIL/uL   Hemoglobin 8.8 (L) 13.0 - 17.0 g/dL   HCT 27.5 (L) 39.0 - 52.0 %   MCV 78.8 (L) 80.0 - 100.0 fL   MCH 25.2 (L) 26.0 - 34.0 pg   MCHC 32.0 30.0 - 36.0 g/dL   RDW 16.3 (H) 11.5 - 15.5 %   Platelets 179 150 - 400 K/uL   nRBC 0.0 0.0 - 0.2  %   Neutrophils Relative % 57 %   Neutro Abs 2.5 1.7 - 7.7 K/uL   Lymphocytes Relative 23 %   Lymphs Abs 1.0 0.7 - 4.0 K/uL   Monocytes Relative 15 %   Monocytes Absolute 0.6 0.1 - 1.0 K/uL   Eosinophils Relative 3 %   Eosinophils Absolute 0.1 0.0 - 0.5 K/uL   Basophils Relative 1 %   Basophils Absolute 0.0 0.0 - 0.1 K/uL   Immature Granulocytes 1 %   Abs Immature Granulocytes 0.02 0.00 - 0.07 K/uL    Comment: Performed at [facility], [place] [address] [place], [place] [address]  Comprehensive metabolic panel     Status: Abnormal   Collection Time: 12/01/20  8:37 AM  Result Value Ref Range   Sodium 139 135 - 145 mmol/L    Comment: DELTA CHECK NOTED   Potassium 4.2 3.5 - 5.1 mmol/L   Chloride 107 98 - 111 mmol/L   CO2 25 22 - 32 mmol/L   Glucose, Bld 143 (H) 70 - 99 mg/dL    Comment: Glucose reference range applies only to samples taken after fasting for at least 8 hours.   BUN 20 6 - 20 mg/dL   Creatinine, Ser 1.31 (H) 0.61 - 1.24 mg/dL  Calcium 8.6 (L) 8.9 - 10.3 mg/dL   Total Protein 6.5 6.5 - 8.1 g/dL   Albumin 2.7 (L) 3.5 - 5.0 g/dL   AST 23 15 - 41 U/L   ALT 19 0 - 44 U/L   Alkaline Phosphatase 205 (H) 38 - 126 U/L   Total Bilirubin 0.7 0.3 - 1.2 mg/dL   GFR, Estimated >60 >60 mL/min    Comment: (NOTE) Calculated using the CKD-EPI Creatinine Equation (2021)    Anion gap 7 5 - 15    Comment: Performed at [facility], [place] [address], [place], [place] [address]  Magnesium     Status: None   Collection Time: 12/01/20  8:37 AM  Result Value Ref Range   Magnesium 2.0 1.7 - 2.4 mg/dL    Comment: Performed at [facility], [place] [address], [place], [place] [address]  Phosphorus     Status: None   Collection Time: 12/01/20  8:37 AM  Result Value Ref Range   Phosphorus 3.3 2.5 - 4.6 mg/dL    Comment: Performed at [facility], [place] [address], [place], [place] [address]  Glucose, capillary      Status: Abnormal   Collection Time: 12/01/20 11:13 AM  Result Value Ref Range   Glucose-Capillary 170 (H) 70 - 99 mg/dL    Comment: Glucose reference range applies only to samples taken after fasting for at least 8 hours.  Glucose, capillary     Status: Abnormal   Collection Time: 12/01/20  4:01 PM  Result Value Ref Range   Glucose-Capillary 157 (H) 70 - 99 mg/dL    Comment: Glucose reference range applies only to samples taken after fasting for at least 8 hours.  Glucose, capillary     Status: Abnormal   Collection Time: 12/01/20  8:19 PM  Result Value Ref Range   Glucose-Capillary 131 (H) 70 - 99 mg/dL    Comment: Glucose reference range applies only to samples taken after fasting for at least 8 hours.  Glucose, capillary     Status: Abnormal   Collection Time: 12/02/20  7:27 AM  Result Value Ref Range   Glucose-Capillary 157 (H) 70 - 99 mg/dL    Comment: Glucose reference range applies only to samples taken after fasting for at least 8 hours.  Glucose, capillary     Status: Abnormal   Collection Time: 12/02/20 11:53 AM  Result Value Ref Range   Glucose-Capillary 131 (H) 70 - 99 mg/dL    Comment: Glucose reference range applies only to samples taken after fasting for at least 8 hours.    Studies/Results: DG Abd 1 View  Result Date: 12/02/2020 CLINICAL DATA:  Recent bowel decompression, abdominal distention EXAM: ABDOMEN - 1 VIEW COMPARISON:  12/01/2020 abdominal radiograph FINDINGS: Marked diffuse gaseous distention of the colon, most prominent in the transverse colon measuring 11.6 cm diameter, increased from 8.8 cm diameter. Marked gaseous distention of the visualized sigmoid colon. The colonic distention appears worsened. No definite pneumatosis or pneumoperitoneum. No significant small bowel dilatation. No radiodense nephrolithiasis. IMPRESSION: Marked diffuse gaseous distention of the colon, most prominent in the transverse and sigmoid colon, worsened. No definite small bowel  dilatation. Findings suggest worsening adynamic ileus. Electronically Signed   By: Ilona Sorrel M.D.   On: 12/02/2020 10:21   DG Abd 1 View  Result Date: 12/01/2020 CLINICAL DATA:  Abdominal distension EXAM: ABDOMEN - 1 VIEW COMPARISON:  11/30/2020, 11/29/2020 FINDINGS: Persistent diffuse gaseous distension of the colon, grossly  similar compared to the previous exam. No gross free intraperitoneal air on AP supine view. IMPRESSION: Persistent diffuse gaseous distension of the colon, grossly similar compared to the previous exam. Electronically Signed   By: Davina Poke D.O.   On: 12/01/2020 14:55    Medications: I have reviewed the patient's current medications.  Assessment: Adynamic ileus-abdominal x-ray today showed diffuse gaseous distention, prominent transverse colon measuring 11.6 cm, no pneumatosis or pneumoperitoneum, worsening adynamic ileus  Potassium 4.2, magnesium 2 Microcytic anemia, hemoglobin 8.8, MCV 78.8  Plan: Abdominal imaging appears worse than clinical presentation We will switch from full liquids to clear liquid, and keep patient n.p.o. after midnight Plan repeat colonoscopy for decompression in a.m. Continue IV fluids, Metamucil twice a day and MiraLAX twice a day. Discussed the same with the patient, who is agreeable for colonoscopy in a.m. tomorrow.  Ronnette Juniper, MD 12/02/2020, 12:05 PM

## 2020-12-03 ENCOUNTER — Inpatient Hospital Stay (HOSPITAL_COMMUNITY): Payer: Medicare HMO | Admitting: Certified Registered Nurse Anesthetist

## 2020-12-03 ENCOUNTER — Inpatient Hospital Stay (HOSPITAL_COMMUNITY): Payer: Medicare HMO

## 2020-12-03 ENCOUNTER — Encounter (HOSPITAL_COMMUNITY): Payer: Self-pay | Admitting: Internal Medicine

## 2020-12-03 ENCOUNTER — Encounter (HOSPITAL_COMMUNITY)
Admission: EM | Disposition: A | Discharge status: Skilled Nursing Facility | Payer: Self-pay | Source: Skilled Nursing Facility | Attending: Internal Medicine

## 2020-12-03 DIAGNOSIS — N179 Acute kidney failure, unspecified: Secondary | ICD-10-CM | POA: Diagnosis not present

## 2020-12-03 DIAGNOSIS — R1084 Generalized abdominal pain: Secondary | ICD-10-CM | POA: Diagnosis not present

## 2020-12-03 DIAGNOSIS — N400 Enlarged prostate without lower urinary tract symptoms: Secondary | ICD-10-CM | POA: Diagnosis not present

## 2020-12-03 DIAGNOSIS — F32A Depression, unspecified: Secondary | ICD-10-CM | POA: Diagnosis not present

## 2020-12-03 HISTORY — PX: BOWEL DECOMPRESSION: SHX5532

## 2020-12-03 HISTORY — PX: COLONOSCOPY WITH PROPOFOL: SHX5780

## 2020-12-03 LAB — CBC WITH DIFFERENTIAL/PLATELET
Abs Immature Granulocytes: 0.01 10*3/uL (ref 0.00–0.07)
Basophils Absolute: 0 10*3/uL (ref 0.0–0.1)
Basophils Relative: 0 %
Eosinophils Absolute: 0 10*3/uL (ref 0.0–0.5)
Eosinophils Relative: 0 %
HCT: 28.9 % — ABNORMAL LOW (ref 39.0–52.0)
Hemoglobin: 9.1 g/dL — ABNORMAL LOW (ref 13.0–17.0)
Immature Granulocytes: 0 %
Lymphocytes Relative: 19 %
Lymphs Abs: 0.7 10*3/uL (ref 0.7–4.0)
MCH: 25.3 pg — ABNORMAL LOW (ref 26.0–34.0)
MCHC: 31.5 g/dL (ref 30.0–36.0)
MCV: 80.3 fL (ref 80.0–100.0)
Monocytes Absolute: 0.3 10*3/uL (ref 0.1–1.0)
Monocytes Relative: 9 %
Neutro Abs: 2.5 10*3/uL (ref 1.7–7.7)
Neutrophils Relative %: 72 %
Platelets: 239 10*3/uL (ref 150–400)
RBC: 3.6 MIL/uL — ABNORMAL LOW (ref 4.22–5.81)
RDW: 16.1 % — ABNORMAL HIGH (ref 11.5–15.5)
WBC: 3.6 10*3/uL — ABNORMAL LOW (ref 4.0–10.5)
nRBC: 0 % (ref 0.0–0.2)

## 2020-12-03 LAB — COMPREHENSIVE METABOLIC PANEL
ALT: 20 U/L (ref 0–44)
AST: 36 U/L (ref 15–41)
Albumin: 2.8 g/dL — ABNORMAL LOW (ref 3.5–5.0)
Alkaline Phosphatase: 270 U/L — ABNORMAL HIGH (ref 38–126)
Anion gap: 7 (ref 5–15)
BUN: 19 mg/dL (ref 6–20)
CO2: 21 mmol/L — ABNORMAL LOW (ref 22–32)
Calcium: 8.2 mg/dL — ABNORMAL LOW (ref 8.9–10.3)
Chloride: 105 mmol/L (ref 98–111)
Creatinine, Ser: 1.25 mg/dL — ABNORMAL HIGH (ref 0.61–1.24)
GFR, Estimated: >60 mL/min (ref >60)
Glucose, Bld: 197 mg/dL — ABNORMAL HIGH (ref 70–99)
Potassium: 4.4 mmol/L (ref 3.5–5.1)
Sodium: 133 mmol/L — ABNORMAL LOW (ref 135–145)
Total Bilirubin: 1.2 mg/dL (ref 0.3–1.2)
Total Protein: 6.6 g/dL (ref 6.5–8.1)

## 2020-12-03 LAB — GLUCOSE, CAPILLARY
Glucose-Capillary: 129 mg/dL — ABNORMAL HIGH (ref 70–99)
Glucose-Capillary: 177 mg/dL — ABNORMAL HIGH (ref 70–99)
Glucose-Capillary: 218 mg/dL — ABNORMAL HIGH (ref 70–99)

## 2020-12-03 LAB — MAGNESIUM: Magnesium: 2.3 mg/dL (ref 1.7–2.4)

## 2020-12-03 LAB — PHOSPHORUS: Phosphorus: 3.5 mg/dL (ref 2.5–4.6)

## 2020-12-03 SURGERY — COLONOSCOPY WITH PROPOFOL
Anesthesia: General

## 2020-12-03 MED ORDER — ONDANSETRON HCL 4 MG/2ML IJ SOLN
INTRAMUSCULAR | Status: DC | PRN
  Administered 2020-12-03: 4 mg via INTRAVENOUS

## 2020-12-03 MED ORDER — KETOROLAC TROMETHAMINE 30 MG/ML IJ SOLN
30.0000 mg | Freq: Once | INTRAMUSCULAR | Status: DC | PRN
  Filled 2020-12-03: qty 1

## 2020-12-03 MED ORDER — ONDANSETRON HCL 4 MG/2ML IJ SOLN: 4.0000 mg | Freq: Once | INTRAMUSCULAR | Status: DC | PRN

## 2020-12-03 MED ORDER — FERROUS SULFATE 325 (65 FE) MG PO TABS
325.0000 mg | ORAL_TABLET | Freq: Every day | ORAL | Status: DC
  Administered 2020-12-03 – 2020-12-11 (×9): 325 mg via ORAL
  Filled 2020-12-03 (×9): qty 1

## 2020-12-03 MED ORDER — PROPOFOL 10 MG/ML IV BOLUS
INTRAVENOUS | Status: DC | PRN
  Administered 2020-12-03: 140 mg via INTRAVENOUS

## 2020-12-03 MED ORDER — PHENYLEPHRINE 40 MCG/ML (10ML) SYRINGE FOR IV PUSH (FOR BLOOD PRESSURE SUPPORT)
PREFILLED_SYRINGE | INTRAVENOUS | Status: DC | PRN
  Administered 2020-12-03: 120 ug via INTRAVENOUS
  Administered 2020-12-03: 80 ug via INTRAVENOUS
  Administered 2020-12-03 (×3): 120 ug via INTRAVENOUS
  Administered 2020-12-03: 80 ug via INTRAVENOUS
  Administered 2020-12-03 (×2): 120 ug via INTRAVENOUS

## 2020-12-03 MED ORDER — TORSEMIDE 20 MG PO TABS
40.0000 mg | ORAL_TABLET | Freq: Two times a day (BID) | ORAL | Status: DC
  Administered 2020-12-03 – 2020-12-06 (×6): 40 mg via ORAL
  Filled 2020-12-03 (×10): qty 2

## 2020-12-03 MED ORDER — INSULIN ASPART 100 UNIT/ML IJ SOLN
0.0000 [IU] | Freq: Three times a day (TID) | INTRAMUSCULAR | Status: DC
Start: 2020-12-03 — End: 2020-12-12
  Administered 2020-12-04 (×2): 2 [IU] via SUBCUTANEOUS
  Administered 2020-12-04: 3 [IU] via SUBCUTANEOUS
  Administered 2020-12-05 – 2020-12-06 (×5): 2 [IU] via SUBCUTANEOUS
  Administered 2020-12-07: 1 [IU] via SUBCUTANEOUS
  Administered 2020-12-07: 3 [IU] via SUBCUTANEOUS
  Administered 2020-12-07 – 2020-12-08 (×3): 2 [IU] via SUBCUTANEOUS
  Administered 2020-12-08: 3 [IU] via SUBCUTANEOUS
  Administered 2020-12-09: 2 [IU] via SUBCUTANEOUS
  Administered 2020-12-09: 3 [IU] via SUBCUTANEOUS
  Administered 2020-12-09 – 2020-12-10 (×2): 2 [IU] via SUBCUTANEOUS
  Administered 2020-12-10 (×2): 3 [IU] via SUBCUTANEOUS
  Administered 2020-12-11 (×3): 2 [IU] via SUBCUTANEOUS

## 2020-12-03 MED ORDER — INSULIN ASPART 100 UNIT/ML IJ SOLN
0.0000 [IU] | Freq: Every day | INTRAMUSCULAR | Status: DC
  Administered 2020-12-03 – 2020-12-11 (×6): 2 [IU] via SUBCUTANEOUS

## 2020-12-03 MED ORDER — FENTANYL CITRATE (PF) 100 MCG/2ML IJ SOLN: 25.0000 ug | INTRAMUSCULAR | Status: DC | PRN

## 2020-12-03 MED ORDER — MIDAZOLAM HCL 2 MG/2ML IJ SOLN
INTRAMUSCULAR | Status: AC
  Filled 2020-12-03: qty 2

## 2020-12-03 MED ORDER — PANTOPRAZOLE SODIUM 40 MG PO TBEC
40.0000 mg | DELAYED_RELEASE_TABLET | Freq: Every day | ORAL | Status: DC
  Administered 2020-12-03 – 2020-12-11 (×9): 40 mg via ORAL
  Filled 2020-12-03 (×9): qty 1

## 2020-12-03 MED ORDER — VITAMIN D (ERGOCALCIFEROL) 1.25 MG (50000 UNIT) PO CAPS
50000.0000 [IU] | ORAL_CAPSULE | Freq: Once | ORAL | Status: AC
  Administered 2020-12-03: 50000 [IU] via ORAL
  Filled 2020-12-03: qty 1

## 2020-12-03 MED ORDER — LACTATED RINGERS IV SOLN
INTRAVENOUS | Status: AC | PRN
  Administered 2020-12-03: 20 mL/h via INTRAVENOUS

## 2020-12-03 MED ORDER — PREGABALIN 75 MG PO CAPS
75.0000 mg | ORAL_CAPSULE | Freq: Two times a day (BID) | ORAL | Status: DC
  Administered 2020-12-03 – 2020-12-11 (×18): 75 mg via ORAL
  Filled 2020-12-03 (×18): qty 1

## 2020-12-03 MED ORDER — POLYETHYLENE GLYCOL 3350 17 G PO PACK: 17.0000 g | PACK | Freq: Two times a day (BID) | ORAL | Status: DC

## 2020-12-03 MED ORDER — SUCCINYLCHOLINE CHLORIDE 200 MG/10ML IV SOSY
PREFILLED_SYRINGE | INTRAVENOUS | Status: DC | PRN
  Administered 2020-12-03: 130 mg via INTRAVENOUS

## 2020-12-03 MED ORDER — INSULIN ASPART 100 UNIT/ML ~~LOC~~ SOLN: 2.0000 [IU] | SUBCUTANEOUS | Status: DC

## 2020-12-03 MED ORDER — FENTANYL CITRATE (PF) 100 MCG/2ML IJ SOLN
INTRAMUSCULAR | Status: DC | PRN
  Administered 2020-12-03 (×2): 50 ug via INTRAVENOUS

## 2020-12-03 MED ORDER — POTASSIUM CHLORIDE CRYS ER 20 MEQ PO TBCR
20.0000 meq | EXTENDED_RELEASE_TABLET | Freq: Two times a day (BID) | ORAL | Status: DC
  Administered 2020-12-03 – 2020-12-08 (×12): 20 meq via ORAL
  Filled 2020-12-03 (×13): qty 1

## 2020-12-03 MED ORDER — SERTRALINE HCL 50 MG PO TABS: 50.0000 mg | ORAL_TABLET | Freq: Every day | ORAL | Status: DC

## 2020-12-03 MED ORDER — SACUBITRIL-VALSARTAN 49-51 MG PO TABS
1.0000 | ORAL_TABLET | Freq: Two times a day (BID) | ORAL | Status: DC
  Administered 2020-12-03 – 2020-12-06 (×8): 1 via ORAL
  Filled 2020-12-03 (×9): qty 1

## 2020-12-03 MED ORDER — DEXAMETHASONE SODIUM PHOSPHATE 10 MG/ML IJ SOLN
INTRAMUSCULAR | Status: DC | PRN
  Administered 2020-12-03: 4 mg via INTRAVENOUS

## 2020-12-03 MED ORDER — PROPOFOL 10 MG/ML IV BOLUS
INTRAVENOUS | Status: AC
  Filled 2020-12-03: qty 20

## 2020-12-03 MED ORDER — SENNOSIDES-DOCUSATE SODIUM 8.6-50 MG PO TABS
1.0000 | ORAL_TABLET | Freq: Every day | ORAL | Status: DC
  Administered 2020-12-03 – 2020-12-11 (×9): 1 via ORAL
  Filled 2020-12-03 (×9): qty 1

## 2020-12-03 MED ORDER — ONDANSETRON HCL 4 MG PO TABS: 4.0000 mg | ORAL_TABLET | ORAL | Status: DC | PRN

## 2020-12-03 MED ORDER — SACCHAROMYCES BOULARDII 250 MG PO CAPS
250.0000 mg | ORAL_CAPSULE | Freq: Two times a day (BID) | ORAL | Status: DC
  Administered 2020-12-03 – 2020-12-11 (×18): 250 mg via ORAL
  Filled 2020-12-03 (×18): qty 1

## 2020-12-03 MED ORDER — FENTANYL CITRATE (PF) 100 MCG/2ML IJ SOLN
INTRAMUSCULAR | Status: AC
  Filled 2020-12-03: qty 2

## 2020-12-03 MED ORDER — ACETAMINOPHEN ER 650 MG PO TBCR: 650.0000 mg | EXTENDED_RELEASE_TABLET | ORAL | Status: DC | PRN

## 2020-12-03 MED ORDER — LIDOCAINE 2% (20 MG/ML) 5 ML SYRINGE
INTRAMUSCULAR | Status: DC | PRN
  Administered 2020-12-03: 100 mg via INTRAVENOUS

## 2020-12-03 SURGICAL SUPPLY — 22 items

## 2020-12-03 NOTE — Anesthesia Procedure Notes (Addendum)
Procedure Name: Intubation Date/Time: 12/03/2020 10:28 AM Performed by: Montel Clock, CRNA Pre-anesthesia Checklist: Patient identified, Emergency Drugs available, Suction available, Patient being monitored and Timeout performed Patient Re-evaluated:Patient Re-evaluated prior to induction Oxygen Delivery Method: Circle system utilized Preoxygenation: Pre-oxygenation with 100% oxygen Induction Type: IV induction, Rapid sequence and Cricoid Pressure applied Laryngoscope Size: Mac and 4 Grade View: Grade II Tube type: Oral Tube size: 7.5 mm Number of attempts: 1 Airway Equipment and Method: Stylet Placement Confirmation: ETT inserted through vocal cords under direct vision, positive ETCO2 and breath sounds checked- equal and bilateral Secured at: 23 cm Tube secured with: Tape Dental Injury: Teeth and Oropharynx as per pre-operative assessment

## 2020-12-03 NOTE — Op Note (Signed)
[facility] Patient Name: [name] Procedure Date: 12/03/2020 MRN: [id number] Attending MD: Ronnette Juniper , MD Date of Birth: 1960/12/28 CSN: [id number] Age: 60 Admit Type: Inpatient Procedure:                Colonoscopy Indications:              For therapy of megacolon Providers:                Ronnette Juniper, MD, Grace Isaac, RN, Tyna Jaksch                            Technician Referring MD:             [facility] Medicines:                Monitored Anesthesia Care Complications:            No immediate complications. Estimated Blood Loss:     Estimated blood loss: none. Procedure:                Pre-Anesthesia Assessment:                           - Prior to the procedure, a History and Physical                            was performed, and patient medications and                            allergies were reviewed. The patient's tolerance of                            previous anesthesia was also reviewed. The risks                            and benefits of the procedure and the sedation                            options and risks were discussed with the patient.                            All questions were answered, and informed consent                            was obtained. Prior Anticoagulants: The patient has                            taken no previous anticoagulant or antiplatelet                            agents. ASA Grade Assessment: III - A patient with                            severe systemic disease. After reviewing the risks  and benefits, the patient was deemed in                            satisfactory condition to undergo the procedure.                           After obtaining informed consent, the colonoscope                            was passed under direct vision. Throughout the                            procedure, the patient's blood pressure, pulse, and                            oxygen saturations  were monitored continuously. The                            [id number]-HQ190L ([id number]) Olympus colonoscope was                            introduced through the anus and advanced to the the                            ascending colon. The colonoscopy was performed                            without difficulty. The patient tolerated the                            procedure well. The quality of the bowel                            preparation was poor. Scope In: 10:36:53 AM Scope Out: 11:09:33 AM Total Procedure Duration: 0 hours 32 minutes 40 seconds  Findings:      The perianal and digital rectal examinations were normal.      Copious quantities of semi-liquid semi-solid solid stool was found in       the rectum, in the sigmoid colon, in the descending colon, at the       splenic flexure, in the transverse colon, at the hepatic flexure and in       the ascending colon, precluding visualization. Lavage of the area was       performed, resulting in incomplete clearance with continued poor       visualization.      The lumen of the sigmoid colon, descending colon, splenic flexure,       transverse colon, hepatic flexure and ascending colon was significantly       dilated. Decompression of the megacolon was attempted and was       successful, with complete decompression achieved.      The scope could not be advanced into the cecum due to poor visualization       and redundant colon. Impression:               - Preparation of the colon was poor.                           -  Stool in the rectum, in the sigmoid colon, in the                            descending colon, at the splenic flexure, in the                            transverse colon, at the hepatic flexure and in the                            ascending colon.                           - Dilated in the sigmoid colon, in the descending                            colon, at the splenic flexure, in the transverse                             colon, at the hepatic flexure and in the ascending                            colon, s/p decompression.                           - No specimens collected. Moderate Sedation:      Patient did not receive moderate sedation for this procedure, but       instead received monitored anesthesia care. Recommendation:           - Clear liquid diet.                           - Keep potassium above 4, magnesium above 2.                           - Avoid Narcotics.                           - Patient is bedbound and cannot be mobilized.                           - If megacolon doesn't resolve or colonic ileus is                            persistent, patient may need colectomy/ileostomy or                            cecostomy. Procedure Code(s):        --- Professional ---                           [phone], 15, Colonoscopy, flexible; with                            decompression (for pathologic distention) (eg,  volvulus, megacolon), including placement of                            decompression tube, when performed Diagnosis Code(s):        --- Professional ---                           K59.39, Other megacolon CPT copyright 2019 [employer]. All rights reserved. The codes documented in this report are preliminary and upon coder review may  be revised to meet current compliance requirements. Ronnette Juniper, MD 12/03/2020 11:20:13 AM This report has been signed electronically. Number of Addenda: 0

## 2020-12-03 NOTE — Anesthesia Preprocedure Evaluation (Signed)
Anesthesia Evaluation  Patient identified by MRN, date of birth, ID band Patient awake    Reviewed: Allergy & Precautions, NPO status , Patient's Chart, lab work & pertinent test results  Airway Mallampati: II  TM Distance: >3 FB Neck ROM: Full    Dental no notable dental hx.    Pulmonary neg pulmonary ROS, former smoker,    Pulmonary exam normal breath sounds clear to auscultation       Cardiovascular hypertension, Pt. on medications Normal cardiovascular exam Rhythm:Regular Rate:Normal     Neuro/Psych Bipolar Disorder TIACVA    GI/Hepatic negative GI ROS, Neg liver ROS,   Endo/Other  diabetes  Renal/GU Renal InsufficiencyRenal disease  negative genitourinary   Musculoskeletal negative musculoskeletal ROS (+)   Abdominal   Peds negative pediatric ROS (+)  Hematology  (+) anemia ,   Anesthesia Other Findings   Reproductive/Obstetrics negative OB ROS                             Anesthesia Physical Anesthesia Plan  ASA: 3 and emergent  Anesthesia Plan: General   Post-op Pain Management:    Induction: Intravenous and Rapid sequence  PONV Risk Score and Plan: 2 and Ondansetron, Dexamethasone and Treatment may vary due to age or medical condition  Airway Management Planned: Oral ETT  Additional Equipment:   Intra-op Plan:   Post-operative Plan: Extubation in OR  Informed Consent: I have reviewed the patients History and Physical, chart, labs and discussed the procedure including the risks, benefits and alternatives for the proposed anesthesia with the patient or authorized representative who has indicated his/her understanding and acceptance.     Dental advisory given  Plan Discussed with: CRNA and Surgeon  Anesthesia Plan Comments:         Anesthesia Quick Evaluation

## 2020-12-03 NOTE — Plan of Care (Signed)
  Problem: Education: Goal: Knowledge of General Education information will improve Description: Including pain rating scale, medication(s)/side effects and non-pharmacologic comfort measures Outcome: Progressing   Problem: Clinical Measurements: Goal: Will remain free from infection Outcome: Progressing Goal: Diagnostic test results will improve Outcome: Progressing   Problem: Nutrition: Goal: Adequate nutrition will be maintained Outcome: Progressing   Problem: Activity: Goal: Risk for activity intolerance will decrease Outcome: Not Progressing

## 2020-12-03 NOTE — Interval H&P Note (Signed)
History and Physical Interval Note: 60/male with megacolon for decompression via colonoscopy with propofol.  12/03/2020 10:07 AM  Jenny Reichmann Gildardo Griffes  has presented today for colonoscopy with propofol with the diagnosis of megacolon.  The various methods of treatment have been discussed with the patient and family. After consideration of risks, benefits and other options for treatment, the patient has consented to  Procedure(s): COLONOSCOPY WITH PROPOFOL (N/A) as a surgical intervention.  The patient's history has been reviewed, patient examined, no change in status, stable for surgery.  I have reviewed the patient's chart and labs.  Questions were answered to the patient's satisfaction.     Ronnette Juniper

## 2020-12-03 NOTE — Progress Notes (Signed)
PROGRESS NOTE    [name]  YYT:[id number] DOB: [birth year] DOA: 11/18/2020 PCP: [name], NP   Brief Narrative:  The patient is a 60 year old chronically ill-appearing African-American morbidly obese male with a past medical history significant for but not limited to heart failure with preserved ejection fraction, hypertension, history of CVA, history of type 2 diabetes mellitus with neuropathy, CKD stage IIIa, polysubstance abuse, chronic pain, depression, hyperlipidemia as well as other comorbidities who presented with worsening abdominal pain.  He was found to have significant gastric distention in the colon on imaging and general surgery and GI were following.  Of note the patient has had bowel decompression in 2021 with Dr. Alessandra Bevels.  He decompression on 11/19/2020 without significant improvement.  GI also recommended NG tube for 11/19/2020 which eventually was removed on 11/28/2020.  He continues to be very slow to improve and currently has rectal tube in place and is on MiraLAX.  He currently remains hungry wants to eat solid food but GI is recommending continuing a liquid diet as they are elected to advance patient's diet to regular food as he does not have much improvement in his abdominal x-ray.  He cannot be mobilized due to his bedbound status and this adds a challenge of colonic ileus.  GI recommends continuing full liquid diet for now and potassium to be kept overnight for magnesium over 2 and continue MiraLAX twice daily as well as Metamucil twice a day and repeating a abdominal x-ray in the a.m.  Repeat KUB yesterday showed "Marked diffuse gaseous distention of the colon, most prominent in the transverse and sigmoid colon, worsened. No definite  small bowel dilatation. Findings suggest worsening adynamic ileus."  GI recommended changing to a clear liquid diet and keeping the patient n.p.o. at midnight for repeat Colonoscopy for Depression.   Assessment & Plan:   Active  Problems:   Essential hypertension   Diabetes mellitus type 2, uncontrolled   Hypokalemia   Grade I diastolic dysfunction   Ileus (HCC)   Ogilvie syndrome   Depression   Acute-on-chronic kidney injury ([place])   BPH (benign prostatic hyperplasia)   Pressure injury of skin   Abdominal pain, generalized   Ogilvie syndrome/colonic pseudoobstruction -Slow to improve -Is admitted to the hospital with Ogilvie syndrome -GI has been consulted and following the patient -Due to his abdominal distention and nausea he had an NG tube placed which was removed on 05/2020 -He underwent a colonoscopy with decompression 11/19/2020 with minimal improvement -He has had a rectal tube in place since 11/24/2020 as well as been placed on twice daily MiraLAX -General surgery has been consulted but did not recommend any surgical intervention at this time -He appears to be improving very slowly and he is increased output from his rectal tube -KUB  showing worsening as "Marked diffuse gaseous distention of the colon, most prominent in the transverse and sigmoid colon, worsened. No definite small bowel dilatation. Findings suggest worsening adynamic ileus." -I spoke with GI and they recommended against narcotics -NG tube has been removed and GI recommending clear liquid diet; he underwent repeat colonic decompression with Colonoscopy on 12/03/20 and preparation of the colon was poor with stool in the rectum -GI recommending keeping his electrolytes with magnesium above 2 and a potassium above 4 as well as continuing MiraLAX twice daily dosing as well as adding Metamucil twice daily  -Repeat KUB in a.m.  Morbid Obesity -Complicates overall prognosis and Care -Estimated body mass index is 42.84 kg/m as calculated  from the following:   Height as of this encounter: 5\' 8"  (1.727 m).   Weight as of this encounter: 127.8 kg. -Weight Loss and Dietary Counseling given   Hypokalemia -Potassium has been improving and today  was 3.8 again but will continue to replete have a goal above 4 -Replete with IV Kcl 40 mEQ x1 yesterday and not repeated -Mag Level was 1.8 so will replete with IV Mag Sulfate 2 grams yesterday and not repeated  -He has a poor p.o. intake and continues to have diarrhea so we will need to monitor carefully -Continue monitoring magnesium level as well -Repeat CMP in a.m.  AKI on CKD stage IIIa -Improving with IV fluid hydration -He has a baseline creatinine of 1.2; BUN/creatinine slightly went up and was 22/1.4 -> 23/1.45 -> 20/1.31 -> 20/1.50 on last check and currently pending repeat this Afternoon -He is currently on D5 NS with KCl 20 mEq at 50 MLS per hour  -Avoid further nephrotoxic medications, contrast dyes, hypotension renally dose medication -Repeat CMP in a.m.  Heart failure with preserved ejection fraction chronic diastolic CHF currently not in exacerbation -Last echocardiogram done was in 07/2018 with EF of 65 to 70% and grade 2 diastolic dysfunction with a small pericardial effusion and mild aortic valve stenosis -He continues remain on Coreg, BiDil and amlodipine -We will need to resume his Entresto in the next day or so along with discontinuation of clonidine blood pressure allows and if he is able to consistently take p.o. -Patient is -0.523 Liters since admission -Continue monitor for signs and symptoms of volume overload as he is currently being rehydrated with D5 normal saline +20 mEq of KCl at 50 MLS per hour  Leukopenia -Mild. WBC went from 4.3 -> 3.8 on last check with repeat pending  -Continue to Monitor and Trend -Repeat CBC in the AM   Diabetes Mellitus Type 2 -Continue sliding scale insulin -His CBGs were okay but he is n.p.o. but now they are starting to rise but -We will add back his Lantus at a lower dose and resume his Jardiance -We will need to continue monitor blood sugars per protocol and CBGs have been ranging from 129-157  Microcytic anemia/anemia  chronic kidney disease -Hemoglobin/hematocrit went from 9.3/29.3 -> 8.9/27.9 -> 9.2/28.9 -> 8.8/27.5 -> 8.0/24.8 on last check and repeat is pending  -Check Anemia Panel in the the a.m. -Continue to monitor for signs and symptoms of bleeding; currently no overt bleeding -Repeat CBC in a.m.  Thrombocytopenia -Mild and improved. Last Platelet Count was 179 and increased to 189 on last check with repeat pending  -Continue to monitor for signs and symptoms of bleeding; currently no overt bleeding noted -Repeat CBC in a.m.  History of CAD -His aspirin 81 mg p.o. daily has been resumed along with his atorvastatin 80 mg p.o. nightly as well as carvedilol 12.5 mg p.o. twice daily and also continue with isosorbide-hydralazine 1 tab p.o. 3 times daily  Depression and Anxiety -Continue with his home Sertraline 50 mg p.o. daily  Hyponatremia -Mild.  Patient's sodium went from 137  -> 131 -> 139 -> 133 -Likely drop in the setting of D5 so fluids were changed to as above -Continue monitor and trend and repeat CMP in a.m.  BPH -Continue with Tamsulosin 0.8 mg p.o. daily -Continue bladder scan and if necessary will need to reinsert Foley catheter as it has been removed  DVT prophylaxis: Enoxaparin 60 mg subcu every 24 Code Status: FULL CODE  Family Communication:  No family currently at bedside Disposition Plan: Pending further clinical improvement and tolerance of diet and clearance by gastroenterology  Status is: Inpatient  Remains inpatient appropriate because:Unsafe d/c plan, IV treatments appropriate due to intensity of illness or inability to take PO, and Inpatient level of care appropriate due to severity of illness  Dispo: The patient is from: Home              Anticipated d/c is to:  TBD              Patient currently is not medically stable to d/c.   Difficult to place patient No  Consultants:  General surgery Gastroenterology  Procedures: Colonoscopy with Repeat Colonoscopy done  12/03/20 Findings:      The perianal and digital rectal examinations were normal.      Copious quantities of semi-liquid semi-solid solid stool was found in       the rectum, in the sigmoid colon, in the descending colon, at the       splenic flexure, in the transverse colon, at the hepatic flexure and in       the ascending colon, precluding visualization. Lavage of the area was       performed, resulting in incomplete clearance with continued poor       visualization.      The lumen of the sigmoid colon, descending colon, splenic flexure,       transverse colon, hepatic flexure and ascending colon was significantly       dilated. Decompression of the megacolon was attempted and was       successful, with complete decompression achieved.      The scope could not be advanced into the cecum due to poor visualization       and redundant colon. Impression:               - Preparation of the colon was poor.                           - Stool in the rectum, in the sigmoid colon, in the                            descending colon, at the splenic flexure, in the                            transverse colon, at the hepatic flexure and in the                            ascending colon.                           - Dilated in the sigmoid colon, in the descending                            colon, at the splenic flexure, in the transverse                            colon, at the hepatic flexure and in the ascending  colon, s/p decompression.                           - No specimens collected.  Antimicrobials:  Anti-infectives (From admission, onward)    None       Subjective: Seen and examined at bedside and thinks his abdomen is not as distended.  Wanting something to eat and states that he wants some eggs with cheese.  No nausea or vomiting.  A little frustrated and agitated.  No other concerns or complaints at this time.  Objective: Vitals:   12/03/20 1002 12/03/20  1134 12/03/20 1140 12/03/20 1150  BP: 123/67 125/78 125/68 129/72  Pulse: 75 69 69 70  Resp: 16 19 14 12   Temp:  98.4 F (36.9 C)    TempSrc:  Oral    SpO2: 100% 100% 100% 100%  Weight:      Height:        Intake/Output Summary (Last 24 hours) at 12/03/2020 1537 Last data filed at 12/03/2020 1124 Gross per 24 hour  Intake 2373.06 ml  Output 1575 ml  Net 798.06 ml    Filed Weights   11/19/20 1626  Weight: 127.8 kg   Examination: Physical Exam:  Constitutional: WN/WD morbidly obese African-American male who is essentially bedbound appears frustrated but in no acute distress Eyes: Lids and conjunctivae normal, sclerae anicteric  ENMT: External Ears, Nose appear normal. Grossly normal hearing.  Neck: Appears normal, supple, no cervical masses, normal ROM, no appreciable thyromegaly; no JVD Respiratory: Diminished to auscultation bilaterally, no wheezing, rales, rhonchi or crackles. Normal respiratory effort and patient is not tachypenic. No accessory muscle use.  Unlabored breathing Cardiovascular: RRR, no murmurs / rubs / gallops. S1 and S2 auscultated. No extremity edema. 2+ pedal pulses. No carotid bruits.  Abdomen: Soft, non-tender, slightly distended and not as distended as yesterday after his decompression.  Bowel sounds are positive but still hypoactive GU: Deferred. Musculoskeletal: No clubbing / cyanosis of digits/nails. No joint deformity upper and lower extremities on limited skin evaluation and legs from prolonged boots Skin: No rashes, lesions, ulcers on limited skin evaluation. No induration; Warm and dry.  Neurologic: CN 2-12 grossly intact with no focal deficits. Romberg sign and cerebellar reflexes not assessed.  Psychiatric: Normal judgment and insight. Alert and oriented x 3.  A little agitated mood and appropriate affect.   Data Reviewed: I have personally reviewed following labs and imaging studies  CBC: Recent Labs  Lab 11/28/20 0353 11/29/20 0755  11/30/20 1015 12/01/20 0837 12/02/20 1328  WBC 4.4 3.6* 4.9 4.3 3.8*  NEUTROABS  --   --  2.8 2.5 2.1  HGB 9.3* 8.9* 9.2* 8.8* 8.0*  HCT 29.3* 27.9* 28.9* 27.5* 24.8*  MCV 79.0* 79.7* 78.5* 78.8* 78.2*  PLT 152 146* 182 179 644    Basic Metabolic Panel: Recent Labs  Lab 11/28/20 0353 11/29/20 0755 11/30/20 1015 12/01/20 0837 12/02/20 1328  NA 137 137 131* 139 133*  K 3.8 3.9 4.2 4.2 3.8  CL 101 101 99 107 105  CO2 29 28 27 25 23   GLUCOSE 175* 143* 145* 143* 158*  BUN 27* 22* 23* 20 20  CREATININE 1.34* 1.46* 1.45* 1.31* 1.50*  CALCIUM 8.3* 8.1* 8.3* 8.6* 8.0*  MG  --  2.0 2.0 2.0 1.8  PHOS  --   --  3.7 3.3 3.3    GFR: Estimated Creatinine Clearance: 69.2 mL/min (A) (by C-G formula based on SCr of 1.5 mg/dL (  H)). Liver Function Tests: Recent Labs  Lab 11/29/20 0755 11/30/20 1015 12/01/20 0837 12/02/20 1328  AST 35 14* 23 11*  ALT 28 18 19 12   ALKPHOS 166* 160* 205* 149*  BILITOT 0.6 0.8 0.7 0.8  PROT 6.0* 6.3* 6.5 6.0*  ALBUMIN 2.6* 2.9* 2.7* 2.6*    No results for input(s): LIPASE, AMYLASE in the last 168 hours. No results for input(s): AMMONIA in the last 168 hours. Coagulation Profile: No results for input(s): INR, PROTIME in the last 168 hours. Cardiac Enzymes: No results for input(s): CKTOTAL, CKMB, CKMBINDEX, TROPONINI in the last 168 hours. BNP (last 3 results) No results for input(s): PROBNP in the last 8760 hours. HbA1C: No results for input(s): HGBA1C in the last 72 hours. CBG: Recent Labs  Lab 12/01/20 2019 12/02/20 0727 12/02/20 1153 12/02/20 2143 12/03/20 0725  GLUCAP 131* 157* 131* 135* 129*    Lipid Profile: No results for input(s): CHOL, HDL, LDLCALC, TRIG, CHOLHDL, LDLDIRECT in the last 72 hours. Thyroid Function Tests: No results for input(s): TSH, T4TOTAL, FREET4, T3FREE, THYROIDAB in the last 72 hours. Anemia Panel: No results for input(s): VITAMINB12, FOLATE, FERRITIN, TIBC, IRON, RETICCTPCT in the last 72 hours. Sepsis  Labs: No results for input(s): PROCALCITON, LATICACIDVEN in the last 168 hours.  No results found for this or any previous visit (from the past 240 hour(s)).   RN Pressure Injury Documentation: Pressure Injury 11/18/20 Heel Right;Posterior Deep Tissue Pressure Injury - Purple or maroon localized area of discolored intact skin or blood-filled blister due to damage of underlying soft tissue from pressure and/or shear. (Active)  11/18/20 2300  Location: Heel  Location Orientation: Right;Posterior  Staging: Deep Tissue Pressure Injury - Purple or maroon localized area of discolored intact skin or blood-filled blister due to damage of underlying soft tissue from pressure and/or shear.  Wound Description (Comments):   Present on Admission: Yes     Pressure Injury 11/25/20 Heel Left Deep Tissue Pressure Injury - Purple or maroon localized area of discolored intact skin or blood-filled blister due to damage of underlying soft tissue from pressure and/or shear. (Active)  11/25/20 2000  Location: Heel  Location Orientation: Left  Staging: Deep Tissue Pressure Injury - Purple or maroon localized area of discolored intact skin or blood-filled blister due to damage of underlying soft tissue from pressure and/or shear.  Wound Description (Comments):   Present on Admission:      Estimated body mass index is 42.84 kg/m as calculated from the following:   Height as of this encounter: 5\' 8"  (1.727 m).   Weight as of this encounter: 127.8 kg.  Malnutrition Type:  Malnutrition Characteristics:   Nutrition Interventions:   Radiology Studies: DG Abd 1 View  Result Date: 12/03/2020 CLINICAL DATA:  Abdominal distension EXAM: ABDOMEN - 1 VIEW COMPARISON:  Portable exam 0912 hours compared to 12/02/2020 as well as multiple earlier exams FINDINGS: Diffuse gaseous distention of the colon again seen. Transverse colon up to 11.9 cm transverse. On 1 image the sigmoid colon is significantly distended up to 16.5  cm diameter but appears decompressed on second image. Wall thickening of the sigmoid colon is identified, new. No gross evidence of free intraperitoneal air on supine portable exam. No definite rectal gas. This pattern of variable colonic distension as been present over numerous exams since 11/17/2020. 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 Chronic elevation of RIGHT diaphragm. Osseous structures unremarkable. IMPRESSION: Chronic gaseous distention of colon, sigmoid colon up to 16.5 cm on first image  but decompressed on second image, with mild sigmoid wall thickening identified. 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 Wall thickening of the sigmoid colon however is new since previous exams, question edema versus colitis; consider CT assessment. Electronically Signed   By: Lavonia Dana M.D.   On: 12/03/2020 11:19   DG Abd 1 View  Result Date: 12/02/2020 CLINICAL DATA:  Recent bowel decompression, abdominal distention EXAM: ABDOMEN - 1 VIEW COMPARISON:  12/01/2020 abdominal radiograph FINDINGS: Marked diffuse gaseous distention of the colon, most prominent in the transverse colon measuring 11.6 cm diameter, increased from 8.8 cm diameter. Marked gaseous distention of the visualized sigmoid colon. The colonic distention appears worsened. No definite pneumatosis or pneumoperitoneum. No significant small bowel dilatation. No radiodense nephrolithiasis. IMPRESSION: Marked diffuse gaseous distention of the colon, most prominent in the transverse and sigmoid colon, worsened. No definite small bowel dilatation. Findings suggest worsening adynamic ileus. Electronically Signed   By: Ilona Sorrel M.D.   On: 12/02/2020 10:21    Scheduled Meds:  amLODipine  10 mg Oral Daily   aspirin  81 mg Oral Daily   atorvastatin  80 mg Oral QHS   carvedilol  12.5 mg Oral BID WC   chlorhexidine  15 mL Mouth Rinse BID   cloNIDine  0.3 mg Transdermal Weekly   empagliflozin  10 mg Oral Daily    enoxaparin (LOVENOX) injection  60 mg Subcutaneous Q24H   ferrous sulfate  325 mg Oral Q breakfast   insulin aspart  0-5 Units Subcutaneous QHS   insulin aspart  0-6 Units Subcutaneous TID WC   insulin aspart  0-9 Units Subcutaneous TID WC   insulin glargine-yfgn  5 Units Subcutaneous Daily   isosorbide-hydrALAZINE  1 tablet Oral TID   mouth rinse  15 mL Mouth Rinse q12n4p   pantoprazole  40 mg Oral Daily   polyethylene glycol  17 g Oral BID   potassium chloride  20 mEq Oral BID   pregabalin  75 mg Oral BID   psyllium  1 packet Oral BID   saccharomyces boulardii  250 mg Oral BID   sacubitril-valsartan  1 tablet Oral BID   senna-docusate  1 tablet Oral Daily   sertraline  50 mg Oral Daily   tamsulosin  0.8 mg Oral Daily   torsemide  40 mg Oral BID   Continuous Infusions:  sodium chloride     dextrose 5 % and 0.9 % NaCl with KCl 20 mEq/L 50 mL/hr at 12/03/20 0133    LOS: 14 days   Kerney Elbe, DO [facility] PAGER is on AMION  If 7PM-7AM, please contact night-coverage [email or web address]

## 2020-12-03 NOTE — Anesthesia Postprocedure Evaluation (Signed)
Anesthesia Post Note  Patient: [name]  Procedure(s) Performed: COLONOSCOPY WITH PROPOFOL BOWEL DECOMPRESSION     Patient location during evaluation: PACU Anesthesia Type: General Level of consciousness: awake and alert Pain management: pain level controlled Vital Signs Assessment: post-procedure vital signs reviewed and stable Respiratory status: spontaneous breathing, nonlabored ventilation, respiratory function stable and patient connected to nasal cannula oxygen Cardiovascular status: blood pressure returned to baseline and stable Postop Assessment: no apparent nausea or vomiting Anesthetic complications: no   No notable events documented.  Last Vitals:  Vitals:   12/03/20 1134 12/03/20 1140  BP: 125/78 125/68  Pulse: 69 69  Resp: 19 14  Temp: 36.9 C   SpO2: 100% 100%    Last Pain:  Vitals:   12/03/20 1134  TempSrc: Oral  PainSc: 0-No pain                 Haunani Dickard S

## 2020-12-03 NOTE — Transfer of Care (Signed)
Immediate Anesthesia Transfer of Care Note  Patient: [name]  Procedure(s) Performed: COLONOSCOPY WITH PROPOFOL BOWEL DECOMPRESSION  Patient Location: Endoscopy Unit  Anesthesia Type:General  Level of Consciousness: drowsy and patient cooperative  Airway & Oxygen Therapy: Patient Spontanous Breathing and Patient connected to face mask oxygen  Post-op Assessment: Report given to RN and Post -op Vital signs reviewed and stable  Post vital signs: Reviewed and stable  Last Vitals:  Vitals Value Taken Time  BP 125/78 12/03/20 1135  Temp 36.9 C 12/03/20 1134  Pulse 69 12/03/20 1137  Resp 20 12/03/20 1137  SpO2 100 % 12/03/20 1137  Vitals shown include unvalidated device data.  Last Pain:  Vitals:   12/03/20 1134  TempSrc: Oral  PainSc: 0-No pain      Patients Stated Pain Goal: 3 (81/84/03 7543)  Complications: No notable events documented.

## 2020-12-04 ENCOUNTER — Inpatient Hospital Stay (HOSPITAL_COMMUNITY): Payer: Medicare HMO

## 2020-12-04 DIAGNOSIS — F32A Depression, unspecified: Secondary | ICD-10-CM | POA: Diagnosis not present

## 2020-12-04 DIAGNOSIS — R1084 Generalized abdominal pain: Secondary | ICD-10-CM | POA: Diagnosis not present

## 2020-12-04 DIAGNOSIS — N179 Acute kidney failure, unspecified: Secondary | ICD-10-CM | POA: Diagnosis not present

## 2020-12-04 DIAGNOSIS — N400 Enlarged prostate without lower urinary tract symptoms: Secondary | ICD-10-CM | POA: Diagnosis not present

## 2020-12-04 LAB — CBC WITH DIFFERENTIAL/PLATELET
Abs Immature Granulocytes: 0.02 10*3/uL (ref 0.00–0.07)
Basophils Absolute: 0 10*3/uL (ref 0.0–0.1)
Basophils Relative: 0 %
Eosinophils Absolute: 0.1 10*3/uL (ref 0.0–0.5)
Eosinophils Relative: 2 %
HCT: 29.6 % — ABNORMAL LOW (ref 39.0–52.0)
Hemoglobin: 9.3 g/dL — ABNORMAL LOW (ref 13.0–17.0)
Immature Granulocytes: 1 %
Lymphocytes Relative: 37 %
Lymphs Abs: 1.2 10*3/uL (ref 0.7–4.0)
MCH: 25.5 pg — ABNORMAL LOW (ref 26.0–34.0)
MCHC: 31.4 g/dL (ref 30.0–36.0)
MCV: 81.1 fL (ref 80.0–100.0)
Monocytes Absolute: 0.5 10*3/uL (ref 0.1–1.0)
Monocytes Relative: 17 %
Neutro Abs: 1.3 10*3/uL — ABNORMAL LOW (ref 1.7–7.7)
Neutrophils Relative %: 43 %
Platelets: 275 10*3/uL (ref 150–400)
RBC: 3.65 MIL/uL — ABNORMAL LOW (ref 4.22–5.81)
RDW: 15.9 % — ABNORMAL HIGH (ref 11.5–15.5)
WBC: 3.1 10*3/uL — ABNORMAL LOW (ref 4.0–10.5)
nRBC: 0 % (ref 0.0–0.2)

## 2020-12-04 LAB — COMPREHENSIVE METABOLIC PANEL
ALT: 24 U/L (ref 0–44)
AST: 21 U/L (ref 15–41)
Albumin: 2.6 g/dL — ABNORMAL LOW (ref 3.5–5.0)
Alkaline Phosphatase: 281 U/L — ABNORMAL HIGH (ref 38–126)
Anion gap: 6 (ref 5–15)
BUN: 18 mg/dL (ref 6–20)
CO2: 23 mmol/L (ref 22–32)
Calcium: 8 mg/dL — ABNORMAL LOW (ref 8.9–10.3)
Chloride: 104 mmol/L (ref 98–111)
Creatinine, Ser: 1.31 mg/dL — ABNORMAL HIGH (ref 0.61–1.24)
GFR, Estimated: >60 mL/min (ref >60)
Glucose, Bld: 209 mg/dL — ABNORMAL HIGH (ref 70–99)
Potassium: 4.1 mmol/L (ref 3.5–5.1)
Sodium: 133 mmol/L — ABNORMAL LOW (ref 135–145)
Total Bilirubin: 0.7 mg/dL (ref 0.3–1.2)
Total Protein: 6.7 g/dL (ref 6.5–8.1)

## 2020-12-04 LAB — GLUCOSE, CAPILLARY
Glucose-Capillary: 187 mg/dL — ABNORMAL HIGH (ref 70–99)
Glucose-Capillary: 213 mg/dL — ABNORMAL HIGH (ref 70–99)
Glucose-Capillary: 199 mg/dL — ABNORMAL HIGH (ref 70–99)
Glucose-Capillary: 213 mg/dL — ABNORMAL HIGH (ref 70–99)

## 2020-12-04 LAB — PHOSPHORUS: Phosphorus: 2.7 mg/dL (ref 2.5–4.6)

## 2020-12-04 LAB — MAGNESIUM: Magnesium: 2.1 mg/dL (ref 1.7–2.4)

## 2020-12-04 MED ORDER — PROSOURCE PLUS PO LIQD
30.0000 mL | Freq: Three times a day (TID) | ORAL | Status: DC
  Administered 2020-12-04 – 2020-12-07 (×8): 30 mL via ORAL
  Filled 2020-12-04 (×9): qty 30

## 2020-12-04 MED ORDER — ADULT MULTIVITAMIN W/MINERALS CH
1.0000 | ORAL_TABLET | Freq: Every day | ORAL | Status: DC
  Administered 2020-12-04 – 2020-12-11 (×8): 1 via ORAL
  Filled 2020-12-04 (×7): qty 1

## 2020-12-04 MED ORDER — BOOST / RESOURCE BREEZE PO LIQD CUSTOM
1.0000 | Freq: Three times a day (TID) | ORAL | Status: DC
  Administered 2020-12-04 – 2020-12-08 (×11): 1 via ORAL

## 2020-12-04 NOTE — Progress Notes (Signed)
Subjective: States he is feeling slightly better today states his spirits are better. Patient denies worsening abdominal pain.  Denies bowel movements. Patient cannot be mobilize due to bedbound status. Patient on liquid diet and remains hungry and requesting food. Denies nausea and vomiting.  Objective: Vital signs in last 24 hours: Temp:  [98.2 F (36.8 C)-98.4 F (36.9 C)] 98.2 F (36.8 C) (10/10 0530) Pulse Rate:  [66-70] 66 (10/10 0530) Resp:  [12-20] 20 (10/10 0530) BP: (121-143)/(68-81) 121/68 (10/10 0530) SpO2:  [99 %-100 %] 100 % (10/10 0530) Weight change:  Last BM Date: 12/03/20  PE: Chronically ill-appearing, bedbound GENERAL: no distress, able to speak in full sentences  ABDOMEN: Distended but soft, bowel sounds audible but sluggish, slight LLQ tenderness on palpation.  EXTREMITIES: Bilateral lower extremities with pressure support boots  Lab Results: Results for orders placed or performed during the hospital encounter of 11/18/20 (from the past 48 hour(s))  Glucose, capillary     Status: Abnormal   Collection Time: 12/02/20 11:53 AM  Result Value Ref Range   Glucose-Capillary 131 (H) 70 - 99 mg/dL    Comment: Glucose reference range applies only to samples taken after fasting for at least 8 hours.  CBC with Differential/Platelet     Status: Abnormal   Collection Time: 12/02/20  1:28 PM  Result Value Ref Range   WBC 3.8 (L) 4.0 - 10.5 K/uL   RBC 3.17 (L) 4.22 - 5.81 MIL/uL   Hemoglobin 8.0 (L) 13.0 - 17.0 g/dL   HCT 24.8 (L) 39.0 - 52.0 %   MCV 78.2 (L) 80.0 - 100.0 fL   MCH 25.2 (L) 26.0 - 34.0 pg   MCHC 32.3 30.0 - 36.0 g/dL   RDW 16.2 (H) 11.5 - 15.5 %   Platelets 189 150 - 400 K/uL   nRBC 0.0 0.0 - 0.2 %   Neutrophils Relative % 55 %   Neutro Abs 2.1 1.7 - 7.7 K/uL   Lymphocytes Relative 23 %   Lymphs Abs 0.9 0.7 - 4.0 K/uL   Monocytes Relative 20 %   Monocytes Absolute 0.7 0.1 - 1.0 K/uL   Eosinophils Relative 2 %   Eosinophils Absolute 0.1 0.0 -  0.5 K/uL   Basophils Relative 0 %   Basophils Absolute 0.0 0.0 - 0.1 K/uL   Immature Granulocytes 0 %   Abs Immature Granulocytes 0.01 0.00 - 0.07 K/uL    Comment: Performed at [facility], [place] [address] [place], [place] [address]  Comprehensive metabolic panel     Status: Abnormal   Collection Time: 12/02/20  1:28 PM  Result Value Ref Range   Sodium 133 (L) 135 - 145 mmol/L   Potassium 3.8 3.5 - 5.1 mmol/L   Chloride 105 98 - 111 mmol/L   CO2 23 22 - 32 mmol/L   Glucose, Bld 158 (H) 70 - 99 mg/dL    Comment: Glucose reference range applies only to samples taken after fasting for at least 8 hours.   BUN 20 6 - 20 mg/dL   Creatinine, Ser 1.50 (H) 0.61 - 1.24 mg/dL   Calcium 8.0 (L) 8.9 - 10.3 mg/dL   Total Protein 6.0 (L) 6.5 - 8.1 g/dL   Albumin 2.6 (L) 3.5 - 5.0 g/dL   AST 11 (L) 15 - 41 U/L   ALT 12 0 - 44 U/L   Alkaline Phosphatase 149 (H) 38 - 126 U/L   Total Bilirubin 0.8 0.3 - 1.2 mg/dL   GFR, Estimated 53 (  L) >60 mL/min    Comment: (NOTE) Calculated using the CKD-EPI Creatinine Equation (2021)    Anion gap 5 5 - 15    Comment: Performed at [facility], [place] [address] [place], [place] [address]  Phosphorus     Status: None   Collection Time: 12/02/20  1:28 PM  Result Value Ref Range   Phosphorus 3.3 2.5 - 4.6 mg/dL    Comment: Performed at [facility], [place] [address], [place], [place] [address]  Magnesium     Status: None   Collection Time: 12/02/20  1:28 PM  Result Value Ref Range   Magnesium 1.8 1.7 - 2.4 mg/dL    Comment: Performed at [facility], [place] [address], [place], [place] [address]  Glucose, capillary     Status: Abnormal   Collection Time: 12/02/20  9:43 PM  Result Value Ref Range   Glucose-Capillary 135 (H) 70 - 99 mg/dL    Comment: Glucose reference range applies only to samples taken after fasting for at least 8 hours.  Glucose, capillary     Status: Abnormal    Collection Time: 12/03/20  7:25 AM  Result Value Ref Range   Glucose-Capillary 129 (H) 70 - 99 mg/dL    Comment: Glucose reference range applies only to samples taken after fasting for at least 8 hours.  CBC with Differential/Platelet     Status: Abnormal   Collection Time: 12/03/20  4:10 PM  Result Value Ref Range   WBC 3.6 (L) 4.0 - 10.5 K/uL   RBC 3.60 (L) 4.22 - 5.81 MIL/uL   Hemoglobin 9.1 (L) 13.0 - 17.0 g/dL   HCT 28.9 (L) 39.0 - 52.0 %   MCV 80.3 80.0 - 100.0 fL   MCH 25.3 (L) 26.0 - 34.0 pg   MCHC 31.5 30.0 - 36.0 g/dL   RDW 16.1 (H) 11.5 - 15.5 %   Platelets 239 150 - 400 K/uL   nRBC 0.0 0.0 - 0.2 %   Neutrophils Relative % 72 %   Neutro Abs 2.5 1.7 - 7.7 K/uL   Lymphocytes Relative 19 %   Lymphs Abs 0.7 0.7 - 4.0 K/uL   Monocytes Relative 9 %   Monocytes Absolute 0.3 0.1 - 1.0 K/uL   Eosinophils Relative 0 %   Eosinophils Absolute 0.0 0.0 - 0.5 K/uL   Basophils Relative 0 %   Basophils Absolute 0.0 0.0 - 0.1 K/uL   Immature Granulocytes 0 %   Abs Immature Granulocytes 0.01 0.00 - 0.07 K/uL   Burr Cells PRESENT    Ovalocytes PRESENT     Comment: Performed at [facility], [place] [address] [place], [place] [address]  Comprehensive metabolic panel     Status: Abnormal   Collection Time: 12/03/20  4:10 PM  Result Value Ref Range   Sodium 133 (L) 135 - 145 mmol/L   Potassium 4.4 3.5 - 5.1 mmol/L   Chloride 105 98 - 111 mmol/L   CO2 21 (L) 22 - 32 mmol/L   Glucose, Bld 197 (H) 70 - 99 mg/dL    Comment: Glucose reference range applies only to samples taken after fasting for at least 8 hours.   BUN 19 6 - 20 mg/dL   Creatinine, Ser 1.25 (H) 0.61 - 1.24 mg/dL   Calcium 8.2 (L) 8.9 - 10.3 mg/dL   Total Protein 6.6 6.5 - 8.1 g/dL   Albumin 2.8 (L) 3.5 - 5.0 g/dL   AST 36 15 -  41 U/L   ALT 20 0 - 44 U/L   Alkaline Phosphatase 270 (H) 38 - 126 U/L   Total Bilirubin 1.2 0.3 - 1.2 mg/dL   GFR, Estimated >60 >60 mL/min    Comment: (NOTE) Calculated  using the CKD-EPI Creatinine Equation (2021)    Anion gap 7 5 - 15    Comment: Performed at [facility], [place] [address] [place], [place] [address]  Magnesium     Status: None   Collection Time: 12/03/20  4:10 PM  Result Value Ref Range   Magnesium 2.3 1.7 - 2.4 mg/dL    Comment: Performed at [facility], [place] [address] [place], [place] [address]  Phosphorus     Status: None   Collection Time: 12/03/20  4:10 PM  Result Value Ref Range   Phosphorus 3.5 2.5 - 4.6 mg/dL    Comment: Performed at [facility], [place] [address] [place], [place] [address]  Glucose, capillary     Status: Abnormal   Collection Time: 12/03/20  4:14 PM  Result Value Ref Range   Glucose-Capillary 177 (H) 70 - 99 mg/dL    Comment: Glucose reference range applies only to samples taken after fasting for at least 8 hours.  Glucose, capillary     Status: Abnormal   Collection Time: 12/03/20  9:59 PM  Result Value Ref Range   Glucose-Capillary 218 (H) 70 - 99 mg/dL    Comment: Glucose reference range applies only to samples taken after fasting for at least 8 hours.  Glucose, capillary     Status: Abnormal   Collection Time: 12/04/20  7:48 AM  Result Value Ref Range   Glucose-Capillary 187 (H) 70 - 99 mg/dL    Comment: Glucose reference range applies only to samples taken after fasting for at least 8 hours.    Studies/Results: DG Abd 1 View  Result Date: 12/03/2020 CLINICAL DATA:  Abdominal distension EXAM: ABDOMEN - 1 VIEW COMPARISON:  Portable exam 0912 hours compared to 12/02/2020 as well as multiple earlier exams FINDINGS: Diffuse gaseous distention of the colon again seen. Transverse colon up to 11.9 cm transverse. On 1 image the sigmoid colon is significantly distended up to 16.5 cm diameter but appears decompressed on second image. Wall thickening of the sigmoid colon is identified, new. No gross evidence of free intraperitoneal air on supine portable  exam. No definite rectal gas. This pattern of variable colonic distension as been present over numerous exams since 11/17/2020. 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 Chronic elevation of RIGHT diaphragm. Osseous structures unremarkable. IMPRESSION: Chronic gaseous distention of colon, sigmoid colon up to 16.5 cm on first image but decompressed on second image, with mild sigmoid wall thickening identified. 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 Wall thickening of the sigmoid colon however is new since previous exams, question edema versus colitis; consider CT assessment. Electronically Signed   By: Lavonia Dana M.D.   On: 12/03/2020 11:19    Medications: I have reviewed the patient's current medications.  Assessment: Adynamic ileus-abdominal x-ray today showed diffuse gaseous distention, prominent transverse colon measuring 11.6 cm, no pneumatosis or pneumoperitoneum, worsening adynamic ileus  Potassium 4.4, magnesium 2.3 Microcytic anemia, hemoglobin 9.1, MCV 80.3  Plan: Pending repeat Abdominal imaging today s/p colonic depression yesterday 12/03/2020 with Dr. Therisa Doyne- may want to consider CT AB and pelvis.  Continue IV fluids, Metamucil twice a day and MiraLAX twice a day. Continue to maintain magnesium above 2 and potassium  at 4-4.5.  Discussed with patient pending current imaging may have to consult general surgery for evaluation for diversion, he has had several episodes of ileus in the past with decompression without resolution, bed bound, complicated case.   Vladimir Crofts, PA-C 12/04/2020, 10:58 AM

## 2020-12-04 NOTE — Progress Notes (Signed)
Initial Nutrition Assessment  DOCUMENTATION CODES:   Not applicable  INTERVENTION:  - will order Boost Breeze TID, each supplement provides 250 kcal and 9 grams of protein. - will order 30 ml Prosource Plus TID, each supplement provides 100 kcal and 15 grams protein.  - will order 1 tablet multivitamin with minerals/day. - diet advancement as medically feasible. - re-weigh patient today.    NUTRITION DIAGNOSIS:   Inadequate oral intake related to acute illness (persistent adynamic ileus) as evidenced by other (comment) (need for liquid diet or NPO status since admission).  GOAL:   Patient will meet greater than or equal to 90% of their needs  MONITOR:   Diet advancement, PO intake, Supplement acceptance, Labs, Weight trends  REASON FOR ASSESSMENT:   Malnutrition Screening Tool  ASSESSMENT:   60 year old male with medical history of heart failure, HTN, CVA, type 2 DM with neuropathy, stage 3 CKD, polysubstance abuse, chronic pain, depression, and HLD. He presented to the ED due to worsening abdominal pain and significant gastric distention. He had gastric decompression on 11/19/20 which did not provide relief. NGT in place 9/25-10/4. Improvement has been very slow. Patient is bedbound. Most recent repeat KUB showed worsening adynamic ileus. Plan for repeat KUB on 10/10.  Patient laying in bed with no family or visitors present. NGT had been in place 9/25-10/4 and has been out since that date. FLD ordered on 10/3 and changed to NPO on 10/8 and advanced to CLD yesterday at 1215.  Patient disgruntled about inability to have any food. Requests several times to have potato soup. He is pending abdominal x-ray today. Will monitor for results and ability for diet advancement after that.   Unable to obtain additional information from patient at this time other than that he has no abdominal pain, pressure, or nausea.   He has not been weighed since admission date of 9/25 at which time he  weighed 282 lb. This was significantly up from 254 lb on 08/07/20. Mild pitting edema to BLE documented in the edema section of flow sheet.    Labs reviewed; CBGs: 187 and 213 mg/dl, Na: 133 mmol/l, creatinine: 1.25 mg/dl, Ca: 8.2 mg/dl, Alk Phos elevated.  Medications reviewed; 10 mg jardiance/day, 325 mg ferrous sulfate/day, sliding scale novolog, 5 units semglee/day, 40 mg oral protonix/day, 17 g miralax BID, 20 mEq Klor-Con BID, 1 packet metamucil BID, 250 mg florastor BID, 1 tablet senokot/day.    IVF; D5-NS-20 mEq KCl @ 50 ml/hr (204 kcal/24 hrs).     NUTRITION - FOCUSED PHYSICAL EXAM:  Unable to complete at this time.  Diet Order:   Diet Order             Diet clear liquid Room service appropriate? Yes; Fluid consistency: Thin  Diet effective now                   EDUCATION NEEDS:   Not appropriate for education at this time  Skin:  Skin Assessment: Skin Integrity Issues: Skin Integrity Issues:: DTI, Other (Comment) DTI: bilateral heels Other: skin tear to L buttocks  Last BM:  10/7 (medium amount of type 7)  Height:   Ht Readings from Last 1 Encounters:  11/19/20 5' 8"  (1.727 m)    Weight:   Wt Readings from Last 1 Encounters:  11/19/20 127.8 kg     Estimated Nutritional Needs:  Kcal:  1950-2200 kcal Protein:  95-110 grams Fluid:  >/= 2.2 L/day      Jarome Matin, MS, RD,  LDN, CNSC Inpatient Clinical Dietitian RD pager # available in [place]  After hours/weekend pager # available in [facility]

## 2020-12-04 NOTE — Progress Notes (Signed)
PROGRESS NOTE    [name]  JZP:[id number] DOB: [birth year] DOA: 11/18/2020 PCP: [name], NP   Brief Narrative:  The patient is a 60 year old chronically ill-appearing African-American morbidly obese male with a past medical history significant for but not limited to heart failure with preserved ejection fraction, hypertension, history of CVA, history of type 2 diabetes mellitus with neuropathy, CKD stage IIIa, polysubstance abuse, chronic pain, depression, hyperlipidemia as well as other comorbidities who presented with worsening abdominal pain.  He was found to have significant gastric distention in the colon on imaging and general surgery and GI were following.  Of note the patient has had bowel decompression in 2021 with Dr. Alessandra Bevels.  He decompression on 11/19/2020 without significant improvement.  GI also recommended NG tube for 11/19/2020 which eventually was removed on 11/28/2020.  He continues to be very slow to improve and currently has rectal tube in place and is on MiraLAX.  He currently remains hungry wants to eat solid food but GI is recommending continuing a liquid diet as they are elected to advance patient's diet to regular food as he does not have much improvement in his abdominal x-ray.  He cannot be mobilized due to his bedbound status and this adds a challenge of colonic ileus.  GI recommends continuing full liquid diet for now and potassium to be kept overnight for magnesium over 2 and continue MiraLAX twice daily as well as Metamucil twice a day and repeating a abdominal x-ray in the a.m.  Repeat KUB the day before yesterday showed "Marked diffuse gaseous distention of the colon, most prominent in the transverse and sigmoid colon, worsened. No definite  small bowel dilatation. Findings suggest worsening adynamic ileus."  GI recommended changing to a clear liquid diet and keeping the patient n.p.o. at midnight for repeat Colonoscopy for Depression which was done yesterday  AM. GI recommended continuing CLD and repeat KUB this AM still pending as well as Labs.  Assessment & Plan:   Active Problems:   Essential hypertension   Diabetes mellitus type 2, uncontrolled   Hypokalemia   Grade I diastolic dysfunction   Ileus (HCC)   Ogilvie syndrome   Depression   Acute-on-chronic kidney injury ([place])   BPH (benign prostatic hyperplasia)   Pressure injury of skin   Abdominal pain, generalized   Ogilvie syndrome/colonic pseudoobstruction -Slow to improve -Is admitted to the hospital with Ogilvie syndrome -GI has been consulted and following the patient -Due to his abdominal distention and nausea he had an NG tube placed which was removed on 05/2020 -He underwent a colonoscopy with decompression 11/19/2020 with minimal improvement and then repeat Colonoscopy with decompression on 12/03/20 -He has had a rectal tube in place since 11/24/2020 as well as been placed on twice daily MiraLAX -General surgery has been consulted but did not recommend any surgical intervention at this time -KUB 12/03/20 showing worsening as "Chronic gaseous distention of colon, sigmoid colon up to 16.5 cm on first image but decompressed on second image, with mild sigmoid wall thickening identified. 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 Wall thickening of the sigmoid colon however is new since previous exams, question edema versus colitis; consider CT assessment." -I spoke with GI and they recommended against narcotics -NG tube has been removed and GI recommending clear liquid diet; he underwent repeat colonic decompression with Colonoscopy on 12/03/20 and preparation of the colon was poor with stool in the rectum -GI recommending keeping his electrolytes with magnesium above 2 and a  potassium above 4 as well as continuing MiraLAX twice daily dosing as well as adding Metamucil twice daily  -Repeat KUB this AM is pending   Morbid Obesity -Complicates overall prognosis and  Care -Estimated body mass index is 42.84 kg/m as calculated from the following:   Height as of this encounter: 5\' 8"  (1.727 m).   Weight as of this encounter: 127.8 kg. -Weight Loss and Dietary Counseling given   Hypokalemia -Potassium has been improving and yesterday was 4.4 again but will continue to replete have a goal above 4; repeat this AM pending  --Mag Level was 2.3 yesterday with repeat pending  -He has a poor p.o. intake and continues to have diarrhea so we will need to monitor carefully -Continue monitoring magnesium level as well -Repeat CMP in a.m.  AKI on CKD stage IIIa -Improving with IV fluid hydration -He has a baseline creatinine of 1.2; BUN/creatinine slightly went up and was 22/1.4 -> 23/1.45 -> 20/1.31 -> 20/1.50 -> 19/1.25 on last check and currently pending repeat this AM -He is currently on D5 NS with KCl 20 mEq at 50 MLS per hour  -Avoid further nephrotoxic medications, contrast dyes, hypotension renally dose medication -Repeat CMP in a.m.  Heart failure with preserved ejection fraction chronic diastolic CHF currently not in exacerbation -Last echocardiogram done was in 07/2018 with EF of 65 to 70% and grade 2 diastolic dysfunction with a small pericardial effusion and mild aortic valve stenosis -He continues remain on Coreg, BiDil and amlodipine -We will need to resume his Entresto in the next day or so along with discontinuation of clonidine blood pressure allows and if he is able to consistently take p.o. -Patient is -2.345 Liters since admission -Continue monitor for signs and symptoms of volume overload as he is currently being rehydrated with D5 normal saline +20 mEq of KCl at 50 MLS per hour  Leukopenia -Mild. WBC went from 4.3 -> 3.8 -> 3.6 on last check with repeat pending  -Continue to Monitor and Trend -Repeat CBC in the AM   Diabetes Mellitus Type 2 -Continue sliding scale insulin -His CBGs were okay but he is n.p.o. but now they are starting to  rise but -We will add back his Lantus at a lower dose and resume his Jardiance -We will need to continue monitor blood sugars per protocol and CBGs have been ranging from 129-218  Microcytic anemia/anemia chronic kidney disease -Hemoglobin/hematocrit went from 9.3/29.3 -> 8.9/27.9 -> 9.2/28.9 -> 8.8/27.5 -> 8.0/24.8 -> 9.1/28.9 on last check and repeat is pending  -Check Anemia Panel in the the a.m. -Continue to monitor for signs and symptoms of bleeding; currently no overt bleeding -Repeat CBC in a.m.  Thrombocytopenia -Mild and improved. Last Platelet Count was 239 on last check with repeat pending  -Continue to monitor for signs and symptoms of bleeding; currently no overt bleeding noted -Repeat CBC in a.m.  History of CAD -His aspirin 81 mg p.o. daily has been resumed along with his atorvastatin 80 mg p.o. nightly as well as carvedilol 12.5 mg p.o. twice daily and also continue with isosorbide-hydralazine 1 tab p.o. 3 times daily  Depression and Anxiety -Continue with his home Sertraline 50 mg p.o. daily  Hyponatremia -Mild.  Patient's sodium went from 137  -> 131 -> 139 -> 133 x2  -Likely drop in the setting of D5 so fluids were changed to as above -Continue monitor and trend and repeat CMP in a.m.  BPH -Continue with Tamsulosin 0.8 mg p.o. daily -Continue  bladder scan and if necessary will need to reinsert Foley catheter as it has been removed  DVT prophylaxis: Enoxaparin 60 mg subcu every 24 Code Status: FULL CODE  Family Communication: No family currently at bedside Disposition Plan: Pending further clinical improvement and tolerance of diet and clearance by gastroenterology  Status is: Inpatient  Remains inpatient appropriate because:Unsafe d/c plan, IV treatments appropriate due to intensity of illness or inability to take PO, and Inpatient level of care appropriate due to severity of illness  Dispo: The patient is from: Home              Anticipated d/c is to:   TBD              Patient currently is not medically stable to d/c.   Difficult to place patient No  Consultants:  General surgery Gastroenterology  Procedures: Colonoscopy with Repeat Colonoscopy done 12/03/20 Findings:      The perianal and digital rectal examinations were normal.      Copious quantities of semi-liquid semi-solid solid stool was found in       the rectum, in the sigmoid colon, in the descending colon, at the       splenic flexure, in the transverse colon, at the hepatic flexure and in       the ascending colon, precluding visualization. Lavage of the area was       performed, resulting in incomplete clearance with continued poor       visualization.      The lumen of the sigmoid colon, descending colon, splenic flexure,       transverse colon, hepatic flexure and ascending colon was significantly       dilated. Decompression of the megacolon was attempted and was       successful, with complete decompression achieved.      The scope could not be advanced into the cecum due to poor visualization       and redundant colon. Impression:               - Preparation of the colon was poor.                           - Stool in the rectum, in the sigmoid colon, in the                            descending colon, at the splenic flexure, in the                            transverse colon, at the hepatic flexure and in the                            ascending colon.                           - Dilated in the sigmoid colon, in the descending                            colon, at the splenic flexure, in the transverse                            colon, at the hepatic  flexure and in the ascending                            colon, s/p decompression.                           - No specimens collected.  Antimicrobials:  Anti-infectives (From admission, onward)    None       Subjective: Seen and examined at bedside and was very somnolent and drowsy and wanting to sleep.  Did not  really want to interact and states that he was okay.  Denies any chest pain and just wanted to go back to sleep and did not really want to participate in the encounter.  Objective: Vitals:   12/03/20 1140 12/03/20 1150 12/03/20 2202 12/04/20 0530  BP: 125/68 129/72 (!) 143/81 121/68  Pulse: 69 70 70 66  Resp: 14 12 20 20   Temp:   98.3 F (36.8 C) 98.2 F (36.8 C)  TempSrc:   Oral Oral  SpO2: 100% 100% 99% 100%  Weight:      Height:        Intake/Output Summary (Last 24 hours) at 12/04/2020 0908 Last data filed at 12/04/2020 0600 Gross per 24 hour  Intake 1535.24 ml  Output 1920 ml  Net -384.76 ml    Filed Weights   11/19/20 1626  Weight: 127.8 kg   Examination: Physical Exam:  Constitutional: WN/WD morbidly obese chronically ill appearing AAM in NAD and appears calm and comfortable sleeping Eyes: Lids and conjunctivae normal, sclerae anicteric  ENMT: External Ears, Nose appear normal. Grossly normal hearing.  Neck: Appears normal, supple, no cervical masses, normal ROM, no appreciable thyromegaly; No appreciable JVD Respiratory: Slightly diminished to auscultation bilaterally with coarse breath sound, no wheezing, rales, rhonchi or crackles. Normal respiratory effort and patient is not tachypenic. No accessory muscle use.  Unlabored breathing Cardiovascular: RRR, no murmurs / rubs / gallops. S1 and S2 auscultated. No extremity edema. 2+ pedal pulses. No carotid bruits.  Abdomen: Soft, mildly tender, distended but not to the degree that he was. No masses palpated. No appreciable hepatosplenomegaly. Bowel sounds positive but still little diminished GU: Deferred. Musculoskeletal: No clubbing / cyanosis of digits/nails. No joint deformity upper and lower extremities but does have his legs in Prevalon boots Skin: No rashes, lesions, ulcers. No induration; Warm and dry.  Neurologic: He is asleep and somnolent and drowsy but when he did wake up his cranial nerves II through XII  grossly are intact. Romberg sign and cerebellar reflexes not assessed.  Psychiatric: Normal judgment and insight.  Somnolent and drowsy and briefly wakes up but wanting to rest  Data Reviewed: I have personally reviewed following labs and imaging studies  CBC: Recent Labs  Lab 11/29/20 0755 11/30/20 1015 12/01/20 0837 12/02/20 1328 12/03/20 1610  WBC 3.6* 4.9 4.3 3.8* 3.6*  NEUTROABS  --  2.8 2.5 2.1 2.5  HGB 8.9* 9.2* 8.8* 8.0* 9.1*  HCT 27.9* 28.9* 27.5* 24.8* 28.9*  MCV 79.7* 78.5* 78.8* 78.2* 80.3  PLT 146* 182 179 189 341    Basic Metabolic Panel: Recent Labs  Lab 11/29/20 0755 11/30/20 1015 12/01/20 0837 12/02/20 1328 12/03/20 1610  NA 137 131* 139 133* 133*  K 3.9 4.2 4.2 3.8 4.4  CL 101 99 107 105 105  CO2 28 27 25 23  21*  GLUCOSE 143* 145* 143* 158* 197*  BUN 22* 23* 20 20  19  CREATININE 1.46* 1.45* 1.31* 1.50* 1.25*  CALCIUM 8.1* 8.3* 8.6* 8.0* 8.2*  MG 2.0 2.0 2.0 1.8 2.3  PHOS  --  3.7 3.3 3.3 3.5    GFR: Estimated Creatinine Clearance: 83 mL/min (A) (by C-G formula based on SCr of 1.25 mg/dL (H)). Liver Function Tests: Recent Labs  Lab 11/29/20 0755 11/30/20 1015 12/01/20 0837 12/02/20 1328 12/03/20 1610  AST 35 14* 23 11* 36  ALT 28 18 19 12 20   ALKPHOS 166* 160* 205* 149* 270*  BILITOT 0.6 0.8 0.7 0.8 1.2  PROT 6.0* 6.3* 6.5 6.0* 6.6  ALBUMIN 2.6* 2.9* 2.7* 2.6* 2.8*    No results for input(s): LIPASE, AMYLASE in the last 168 hours. No results for input(s): AMMONIA in the last 168 hours. Coagulation Profile: No results for input(s): INR, PROTIME in the last 168 hours. Cardiac Enzymes: No results for input(s): CKTOTAL, CKMB, CKMBINDEX, TROPONINI in the last 168 hours. BNP (last 3 results) No results for input(s): PROBNP in the last 8760 hours. HbA1C: No results for input(s): HGBA1C in the last 72 hours. CBG: Recent Labs  Lab 12/02/20 2143 12/03/20 0725 12/03/20 1614 12/03/20 2159 12/04/20 0748  GLUCAP 135* 129* 177* 218* 187*     Lipid Profile: No results for input(s): CHOL, HDL, LDLCALC, TRIG, CHOLHDL, LDLDIRECT in the last 72 hours. Thyroid Function Tests: No results for input(s): TSH, T4TOTAL, FREET4, T3FREE, THYROIDAB in the last 72 hours. Anemia Panel: No results for input(s): VITAMINB12, FOLATE, FERRITIN, TIBC, IRON, RETICCTPCT in the last 72 hours. Sepsis Labs: No results for input(s): PROCALCITON, LATICACIDVEN in the last 168 hours.  No results found for this or any previous visit (from the past 240 hour(s)).   RN Pressure Injury Documentation: Pressure Injury 11/18/20 Heel Right;Posterior Deep Tissue Pressure Injury - Purple or maroon localized area of discolored intact skin or blood-filled blister due to damage of underlying soft tissue from pressure and/or shear. (Active)  11/18/20 2300  Location: Heel  Location Orientation: Right;Posterior  Staging: Deep Tissue Pressure Injury - Purple or maroon localized area of discolored intact skin or blood-filled blister due to damage of underlying soft tissue from pressure and/or shear.  Wound Description (Comments):   Present on Admission: Yes     Pressure Injury 11/25/20 Heel Left Deep Tissue Pressure Injury - Purple or maroon localized area of discolored intact skin or blood-filled blister due to damage of underlying soft tissue from pressure and/or shear. (Active)  11/25/20 2000  Location: Heel  Location Orientation: Left  Staging: Deep Tissue Pressure Injury - Purple or maroon localized area of discolored intact skin or blood-filled blister due to damage of underlying soft tissue from pressure and/or shear.  Wound Description (Comments):   Present on Admission:      Estimated body mass index is 42.84 kg/m as calculated from the following:   Height as of this encounter: 5\' 8"  (1.727 m).   Weight as of this encounter: 127.8 kg.  Malnutrition Type:  Malnutrition Characteristics:   Nutrition Interventions:   Radiology Studies: DG Abd 1  View  Result Date: 12/03/2020 CLINICAL DATA:  Abdominal distension EXAM: ABDOMEN - 1 VIEW COMPARISON:  Portable exam 0912 hours compared to 12/02/2020 as well as multiple earlier exams FINDINGS: Diffuse gaseous distention of the colon again seen. Transverse colon up to 11.9 cm transverse. On 1 image the sigmoid colon is significantly distended up to 16.5 cm diameter but appears decompressed on second image. Wall thickening of the sigmoid colon is identified, new. No  gross evidence of free intraperitoneal air on supine portable exam. No definite rectal gas. This pattern of variable colonic distension as been present over numerous exams since 11/17/2020. 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 Chronic elevation of RIGHT diaphragm. Osseous structures unremarkable. IMPRESSION: Chronic gaseous distention of colon, sigmoid colon up to 16.5 cm on first image but decompressed on second image, with mild sigmoid wall thickening identified. 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 Wall thickening of the sigmoid colon however is new since previous exams, question edema versus colitis; consider CT assessment. Electronically Signed   By: Lavonia Dana M.D.   On: 12/03/2020 11:19    Scheduled Meds:  amLODipine  10 mg Oral Daily   aspirin  81 mg Oral Daily   atorvastatin  80 mg Oral QHS   carvedilol  12.5 mg Oral BID WC   chlorhexidine  15 mL Mouth Rinse BID   cloNIDine  0.3 mg Transdermal Weekly   empagliflozin  10 mg Oral Daily   enoxaparin (LOVENOX) injection  60 mg Subcutaneous Q24H   ferrous sulfate  325 mg Oral Q breakfast   insulin aspart  0-5 Units Subcutaneous QHS   insulin aspart  0-9 Units Subcutaneous TID WC   insulin glargine-yfgn  5 Units Subcutaneous Daily   isosorbide-hydrALAZINE  1 tablet Oral TID   mouth rinse  15 mL Mouth Rinse q12n4p   pantoprazole  40 mg Oral Daily   polyethylene glycol  17 g Oral BID   potassium chloride  20 mEq Oral BID   pregabalin  75 mg Oral  BID   psyllium  1 packet Oral BID   saccharomyces boulardii  250 mg Oral BID   sacubitril-valsartan  1 tablet Oral BID   senna-docusate  1 tablet Oral Daily   sertraline  50 mg Oral Daily   tamsulosin  0.8 mg Oral Daily   torsemide  40 mg Oral BID   Continuous Infusions:  sodium chloride     dextrose 5 % and 0.9 % NaCl with KCl 20 mEq/L 50 mL/hr at 12/03/20 2256    LOS: 15 days   Kerney Elbe, DO [facility] PAGER is on AMION  If 7PM-7AM, please contact night-coverage [email or web address]

## 2020-12-04 NOTE — Progress Notes (Signed)
0800- CBG 187. Sliding scale insulin and scheduled insulin given per orders. Pt drowsy. Breath sounds diminished @ bases. Reports pain. Pt made aware of new scheduled pain medication included in regimen. Call bell within reach.  1130- Complete bed bath given and linens changed by Hansel Starling, NT. Assisted by this nurse. Call bell within reach.  1200- Radiology techs in for xray. Pt   1400- Pt refused blood work. Phlebotomy called and made this nurse aware and states they will attempt once more.  1730- Uneventful shift. Pt sitting up in bed eating dinner. Medications administered per orders. No complaints voiced. Will continue to monitor.

## 2020-12-05 ENCOUNTER — Encounter (HOSPITAL_COMMUNITY): Payer: Self-pay | Admitting: Gastroenterology

## 2020-12-05 DIAGNOSIS — K5981 Ogilvie syndrome: Secondary | ICD-10-CM | POA: Diagnosis not present

## 2020-12-05 DIAGNOSIS — I5032 Chronic diastolic (congestive) heart failure: Secondary | ICD-10-CM | POA: Diagnosis not present

## 2020-12-05 LAB — COMPREHENSIVE METABOLIC PANEL
ALT: 17 U/L (ref 0–44)
AST: 13 U/L — ABNORMAL LOW (ref 15–41)
Albumin: 2.6 g/dL — ABNORMAL LOW (ref 3.5–5.0)
Alkaline Phosphatase: 252 U/L — ABNORMAL HIGH (ref 38–126)
Anion gap: 5 (ref 5–15)
BUN: 17 mg/dL (ref 6–20)
CO2: 22 mmol/L (ref 22–32)
Calcium: 7.8 mg/dL — ABNORMAL LOW (ref 8.9–10.3)
Chloride: 105 mmol/L (ref 98–111)
Creatinine, Ser: 1.42 mg/dL — ABNORMAL HIGH (ref 0.61–1.24)
GFR, Estimated: 57 mL/min — ABNORMAL LOW (ref >60)
Glucose, Bld: 176 mg/dL — ABNORMAL HIGH (ref 70–99)
Potassium: 4.6 mmol/L (ref 3.5–5.1)
Sodium: 132 mmol/L — ABNORMAL LOW (ref 135–145)
Total Bilirubin: 0.4 mg/dL (ref 0.3–1.2)
Total Protein: 6.3 g/dL — ABNORMAL LOW (ref 6.5–8.1)

## 2020-12-05 LAB — CBC WITH DIFFERENTIAL/PLATELET
Abs Immature Granulocytes: 0.02 10*3/uL (ref 0.00–0.07)
Basophils Absolute: 0 10*3/uL (ref 0.0–0.1)
Basophils Relative: 0 %
Eosinophils Absolute: 0.1 10*3/uL (ref 0.0–0.5)
Eosinophils Relative: 3 %
HCT: 29.9 % — ABNORMAL LOW (ref 39.0–52.0)
Hemoglobin: 9.3 g/dL — ABNORMAL LOW (ref 13.0–17.0)
Immature Granulocytes: 1 %
Lymphocytes Relative: 33 %
Lymphs Abs: 1.1 10*3/uL (ref 0.7–4.0)
MCH: 24.8 pg — ABNORMAL LOW (ref 26.0–34.0)
MCHC: 31.1 g/dL (ref 30.0–36.0)
MCV: 79.7 fL — ABNORMAL LOW (ref 80.0–100.0)
Monocytes Absolute: 0.5 10*3/uL (ref 0.1–1.0)
Monocytes Relative: 16 %
Neutro Abs: 1.5 10*3/uL — ABNORMAL LOW (ref 1.7–7.7)
Neutrophils Relative %: 47 %
Platelets: 283 10*3/uL (ref 150–400)
RBC: 3.75 MIL/uL — ABNORMAL LOW (ref 4.22–5.81)
RDW: 15.9 % — ABNORMAL HIGH (ref 11.5–15.5)
WBC: 3.3 10*3/uL — ABNORMAL LOW (ref 4.0–10.5)
nRBC: 0 % (ref 0.0–0.2)

## 2020-12-05 LAB — GLUCOSE, CAPILLARY
Glucose-Capillary: 159 mg/dL — ABNORMAL HIGH (ref 70–99)
Glucose-Capillary: 186 mg/dL — ABNORMAL HIGH (ref 70–99)
Glucose-Capillary: 181 mg/dL — ABNORMAL HIGH (ref 70–99)
Glucose-Capillary: 154 mg/dL — ABNORMAL HIGH (ref 70–99)
Glucose-Capillary: 166 mg/dL — ABNORMAL HIGH (ref 70–99)

## 2020-12-05 LAB — PHOSPHORUS: Phosphorus: 2.5 mg/dL (ref 2.5–4.6)

## 2020-12-05 LAB — MAGNESIUM: Magnesium: 1.9 mg/dL (ref 1.7–2.4)

## 2020-12-05 MED ORDER — OXYCODONE HCL 5 MG PO TABS
5.0000 mg | ORAL_TABLET | ORAL | Status: DC | PRN
Start: 2020-12-05 — End: 2020-12-12
  Administered 2020-12-05 – 2020-12-11 (×11): 5 mg via ORAL
  Filled 2020-12-05 (×12): qty 1

## 2020-12-05 NOTE — Progress Notes (Signed)
Subjective: Patient was sleeping when I entered the room. She was started on soft diet yesterday, empty tray sits next to bed.  Patient denies nausea and vomiting.  States he is doing well Patient denies worsening abdominal pain.  Denies bowel movements. Patient cannot be mobilize due to bedbound status.   Objective: Vital signs in last 24 hours: Temp:  [97.7 F (36.5 C)-98.4 F (36.9 C)] 97.7 F (36.5 C) (10/11 0838) Pulse Rate:  [66-70] 70 (10/11 0838) Resp:  [17-18] 17 (10/11 0838) BP: (95-144)/(53-91) 95/66 (10/11 0838) SpO2:  [100 %] 100 % (10/11 0838) Weight change:  Last BM Date: 12/04/20 (rectal tube)  PE: Chronically ill-appearing, bedbound GENERAL: no distress, able to speak in full sentences  ABDOMEN: Distended but soft and improved from yesterday, bowel sounds audible but sluggish, nontender AB EXTREMITIES: Bilateral lower extremities with pressure support boots  Lab Results: Results for orders placed or performed during the hospital encounter of 11/18/20 (from the past 48 hour(s))  CBC with Differential/Platelet     Status: Abnormal   Collection Time: 12/03/20  4:10 PM  Result Value Ref Range   WBC 3.6 (L) 4.0 - 10.5 K/uL   RBC 3.60 (L) 4.22 - 5.81 MIL/uL   Hemoglobin 9.1 (L) 13.0 - 17.0 g/dL   HCT 28.9 (L) 39.0 - 52.0 %   MCV 80.3 80.0 - 100.0 fL   MCH 25.3 (L) 26.0 - 34.0 pg   MCHC 31.5 30.0 - 36.0 g/dL   RDW 16.1 (H) 11.5 - 15.5 %   Platelets 239 150 - 400 K/uL   nRBC 0.0 0.0 - 0.2 %   Neutrophils Relative % 72 %   Neutro Abs 2.5 1.7 - 7.7 K/uL   Lymphocytes Relative 19 %   Lymphs Abs 0.7 0.7 - 4.0 K/uL   Monocytes Relative 9 %   Monocytes Absolute 0.3 0.1 - 1.0 K/uL   Eosinophils Relative 0 %   Eosinophils Absolute 0.0 0.0 - 0.5 K/uL   Basophils Relative 0 %   Basophils Absolute 0.0 0.0 - 0.1 K/uL   Immature Granulocytes 0 %   Abs Immature Granulocytes 0.01 0.00 - 0.07 K/uL   Burr Cells PRESENT    Ovalocytes PRESENT     Comment: Performed at  [facility] [place] [address] [place], [place] [address]  Comprehensive metabolic panel     Status: Abnormal   Collection Time: 12/03/20  4:10 PM  Result Value Ref Range   Sodium 133 (L) 135 - 145 mmol/L   Potassium 4.4 3.5 - 5.1 mmol/L   Chloride 105 98 - 111 mmol/L   CO2 21 (L) 22 - 32 mmol/L   Glucose, Bld 197 (H) 70 - 99 mg/dL    Comment: Glucose reference range applies only to samples taken after fasting for at least 8 hours.   BUN 19 6 - 20 mg/dL   Creatinine, Ser 1.25 (H) 0.61 - 1.24 mg/dL   Calcium 8.2 (L) 8.9 - 10.3 mg/dL   Total Protein 6.6 6.5 - 8.1 g/dL   Albumin 2.8 (L) 3.5 - 5.0 g/dL   AST 36 15 - 41 U/L   ALT 20 0 - 44 U/L   Alkaline Phosphatase 270 (H) 38 - 126 U/L   Total Bilirubin 1.2 0.3 - 1.2 mg/dL   GFR, Estimated >60 >60 mL/min    Comment: (NOTE) Calculated using the CKD-EPI Creatinine Equation (2021)    Anion gap 7 5 - 15    Comment: Performed at [facility], [place] [address] [place], [place] [address]  Magnesium     Status: None   Collection Time: 12/03/20  4:10 PM  Result Value Ref Range   Magnesium 2.3 1.7 - 2.4 mg/dL    Comment: Performed at [facility], [place] [address] [place], [place] [address]  Phosphorus     Status: None   Collection Time: 12/03/20  4:10 PM  Result Value Ref Range   Phosphorus 3.5 2.5 - 4.6 mg/dL    Comment: Performed at [facility], [place] [address] [place], [place] [address]  Glucose, capillary     Status: Abnormal   Collection Time: 12/03/20  4:14 PM  Result Value Ref Range   Glucose-Capillary 177 (H) 70 - 99 mg/dL    Comment: Glucose reference range applies only to samples taken after fasting for at least 8 hours.  Glucose, capillary     Status: Abnormal   Collection Time: 12/03/20  9:59 PM  Result Value Ref Range   Glucose-Capillary 218 (H) 70 - 99 mg/dL    Comment: Glucose reference range applies only to samples taken after fasting for at least  8 hours.  Glucose, capillary     Status: Abnormal   Collection Time: 12/04/20  7:48 AM  Result Value Ref Range   Glucose-Capillary 187 (H) 70 - 99 mg/dL    Comment: Glucose reference range applies only to samples taken after fasting for at least 8 hours.  Glucose, capillary     Status: Abnormal   Collection Time: 12/04/20 11:46 AM  Result Value Ref Range   Glucose-Capillary 213 (H) 70 - 99 mg/dL    Comment: Glucose reference range applies only to samples taken after fasting for at least 8 hours.  Comprehensive metabolic panel     Status: Abnormal   Collection Time: 12/04/20  1:17 PM  Result Value Ref Range   Sodium 133 (L) 135 - 145 mmol/L   Potassium 4.1 3.5 - 5.1 mmol/L   Chloride 104 98 - 111 mmol/L   CO2 23 22 - 32 mmol/L   Glucose, Bld 209 (H) 70 - 99 mg/dL    Comment: Glucose reference range applies only to samples taken after fasting for at least 8 hours.   BUN 18 6 - 20 mg/dL   Creatinine, Ser 1.31 (H) 0.61 - 1.24 mg/dL   Calcium 8.0 (L) 8.9 - 10.3 mg/dL   Total Protein 6.7 6.5 - 8.1 g/dL   Albumin 2.6 (L) 3.5 - 5.0 g/dL   AST 21 15 - 41 U/L   ALT 24 0 - 44 U/L   Alkaline Phosphatase 281 (H) 38 - 126 U/L   Total Bilirubin 0.7 0.3 - 1.2 mg/dL   GFR, Estimated >60 >60 mL/min    Comment: (NOTE) Calculated using the CKD-EPI Creatinine Equation (2021)    Anion gap 6 5 - 15    Comment: Performed at [facility], [place] [address], [place],  [address]  CBC with Differential/Platelet     Status: Abnormal   Collection Time: 12/04/20  1:17 PM  Result Value Ref Range   WBC 3.1 (L) 4.0 - 10.5 K/uL   RBC 3.65 (L) 4.22 - 5.81 MIL/uL   Hemoglobin 9.3 (L) 13.0 - 17.0 g/dL   HCT 29.6 (L) 39.0 - 52.0 %   MCV 81.1 80.0 - 100.0 fL   MCH 25.5 (L) 26.0 - 34.0 pg   MCHC 31.4 30.0 - 36.0 g/dL   RDW 15.9 (H)  11.5 - 15.5 %   Platelets 275 150 - 400 K/uL   nRBC 0.0 0.0 - 0.2 %   Neutrophils Relative % 43 %   Neutro Abs 1.3 (L) 1.7 - 7.7 K/uL   Lymphocytes  Relative 37 %   Lymphs Abs 1.2 0.7 - 4.0 K/uL   Monocytes Relative 17 %   Monocytes Absolute 0.5 0.1 - 1.0 K/uL   Eosinophils Relative 2 %   Eosinophils Absolute 0.1 0.0 - 0.5 K/uL   Basophils Relative 0 %   Basophils Absolute 0.0 0.0 - 0.1 K/uL   Immature Granulocytes 1 %   Abs Immature Granulocytes 0.02 0.00 - 0.07 K/uL    Comment: Performed at [facility], [place] [address], [place], [place] [address]  Magnesium     Status: None   Collection Time: 12/04/20  1:17 PM  Result Value Ref Range   Magnesium 2.1 1.7 - 2.4 mg/dL    Comment: Performed at [facility], [place] [address], [place], [place] [address]  Phosphorus     Status: None   Collection Time: 12/04/20  1:17 PM  Result Value Ref Range   Phosphorus 2.7 2.5 - 4.6 mg/dL    Comment: Performed at [facility], [place] [address], [place], [place] [address]  Glucose, capillary     Status: Abnormal   Collection Time: 12/04/20  6:39 PM  Result Value Ref Range   Glucose-Capillary 199 (H) 70 - 99 mg/dL    Comment: Glucose reference range applies only to samples taken after fasting for at least 8 hours.  Glucose, capillary     Status: Abnormal   Collection Time: 12/04/20  9:52 PM  Result Value Ref Range   Glucose-Capillary 213 (H) 70 - 99 mg/dL    Comment: Glucose reference range applies only to samples taken after fasting for at least 8 hours.  Glucose, capillary     Status: Abnormal   Collection Time: 12/05/20  8:17 AM  Result Value Ref Range   Glucose-Capillary 159 (H) 70 - 99 mg/dL    Comment: Glucose reference range applies only to samples taken after fasting for at least 8 hours.  CBC with Differential/Platelet     Status: Abnormal   Collection Time: 12/05/20  8:32 AM  Result Value Ref Range   WBC 3.3 (L) 4.0 - 10.5 K/uL   RBC 3.75 (L) 4.22 - 5.81 MIL/uL   Hemoglobin 9.3 (L) 13.0 - 17.0 g/dL   HCT 29.9 (L) 39.0 - 52.0 %   MCV 79.7 (L) 80.0 - 100.0 fL   MCH 24.8 (L) 26.0  - 34.0 pg   MCHC 31.1 30.0 - 36.0 g/dL   RDW 15.9 (H) 11.5 - 15.5 %   Platelets 283 150 - 400 K/uL   nRBC 0.0 0.0 - 0.2 %   Neutrophils Relative % 47 %   Neutro Abs 1.5 (L) 1.7 - 7.7 K/uL   Lymphocytes Relative 33 %   Lymphs Abs 1.1 0.7 - 4.0 K/uL   Monocytes Relative 16 %   Monocytes Absolute 0.5 0.1 - 1.0 K/uL   Eosinophils Relative 3 %   Eosinophils Absolute 0.1 0.0 - 0.5 K/uL   Basophils Relative 0 %   Basophils Absolute 0.0 0.0 - 0.1 K/uL   Immature Granulocytes 1 %   Abs Immature Granulocytes 0.02 0.00 - 0.07 K/uL   Polychromasia PRESENT    Ovalocytes PRESENT     Comment: Performed at [facility], [place]  [address] [place], [place] [address]  Comprehensive metabolic panel     Status: Abnormal   Collection Time: 12/05/20  8:32 AM  Result Value Ref Range   Sodium 132 (L) 135 - 145 mmol/L   Potassium 4.6 3.5 - 5.1 mmol/L   Chloride 105 98 - 111 mmol/L   CO2 22 22 - 32 mmol/L   Glucose, Bld 176 (H) 70 - 99 mg/dL    Comment: Glucose reference range applies only to samples taken after fasting for at least 8 hours.   BUN 17 6 - 20 mg/dL   Creatinine, Ser 1.42 (H) 0.61 - 1.24 mg/dL   Calcium 7.8 (L) 8.9 - 10.3 mg/dL   Total Protein 6.3 (L) 6.5 - 8.1 g/dL   Albumin 2.6 (L) 3.5 - 5.0 g/dL   AST 13 (L) 15 - 41 U/L   ALT 17 0 - 44 U/L   Alkaline Phosphatase 252 (H) 38 - 126 U/L   Total Bilirubin 0.4 0.3 - 1.2 mg/dL   GFR, Estimated 57 (L) >60 mL/min    Comment: (NOTE) Calculated using the CKD-EPI Creatinine Equation (2021)    Anion gap 5 5 - 15    Comment: Performed at [facility], [place] [address], [place], [place] [address]  Magnesium     Status: None   Collection Time: 12/05/20  8:32 AM  Result Value Ref Range   Magnesium 1.9 1.7 - 2.4 mg/dL    Comment: Performed at [facility], [place] [address], [place], [place] [address]  Phosphorus     Status: None   Collection Time: 12/05/20  8:32 AM  Result Value Ref Range    Phosphorus 2.5 2.5 - 4.6 mg/dL    Comment: Performed at [facility], [place] [address], [place],  [address]    Studies/Results: DG Abd 1 View  Result Date: 12/04/2020 CLINICAL DATA:  Abdominal distension. EXAM: ABDOMEN - 1 VIEW COMPARISON:  December 03, 2020. FINDINGS: No small bowel dilatation is noted. No abnormal calcifications are noted. There appears to be mildly decreased gaseous distention of the colon. IMPRESSION: Mildly decreased gaseous distention of the colon is noted suggesting improving ileus. Electronically Signed   By: Marijo Conception M.D.   On: 12/04/2020 15:44    Medications: I have reviewed the patient's current medications.  Assessment: Adynamic ileus-abdominal x-ray today showed diffuse gaseous distention, prominent transverse colon measuring 11.6 cm, no pneumatosis or pneumoperitoneum, worsening adynamic ileus KUB 12/04/2020 Mildly decreased gaseous distention of the colon is noted suggesting improving ileus.AB softer today on exam.   Potassium 4.6, magnesium 1.9 Microcytic anemia, hemoglobin 9.3, MCV 79.7  Plan: s/p colonic depression yesterday 12/03/2020 with Dr. Therisa Doyne KUB 12/04/20 showed slight improvement in ileus Continue Metamucil twice a day and MiraLAX twice a day. Continue to maintain magnesium above 2 and potassium at 4-4.5.  May still benefit from consult general surgery for evaluation for diversion in the future, he has had several episodes of ileus in the past with decompression without resolution, bed bound, complicated case thought this seems to be his baseline at this point.  Continue soft diet as tolerated.    Vladimir Crofts, PA-C 12/05/2020, 9:43 AM

## 2020-12-05 NOTE — Assessment & Plan Note (Addendum)
-   Last echo 08/22/2020: EF 65 to 90%, grade 2 diastolic dysfunction -Continue aspirin, Lipitor, BiDil, Entresto, Jardiance - d/c coreg; discussed with cardiology  - no s/s exacerbation at this time

## 2020-12-05 NOTE — Assessment & Plan Note (Signed)
-   Last A1c 9.1% on 06/25/2020 - Continue SSI and CBG monitoring

## 2020-12-05 NOTE — NC FL2 (Signed)
[place] LEVEL OF CARE SCREENING TOOL     IDENTIFICATION  Patient Name: [name] Birthdate: 1960/04/13 Sex: male Admission Date (Current Location): 11/18/2020  [place] and [place] Number:  Kathleen Argue [id number] [place] and Address:  [facility],  [place], [place]      Provider Number: [id number]  Attending Physician Name and Address:  Dwyane Dee, MD  Relative Name and Phone Number:  Ronald, Londo   [phone]    Current Level of Care: Hospital Recommended Level of Care: [place] Prior Approval Number:    Date Approved/Denied:   PASRR Number: [id number]  Discharge Plan: SNF    Current Diagnoses: Patient Active Problem List   Diagnosis Date Noted   Pressure injury of skin 11/19/2020   Ileus ([place]) 11/18/2020   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   Depression 11/18/2020   Acute renal failure superimposed on stage 3a chronic kidney disease ([place]) 11/18/2020   BPH (benign prostatic hyperplasia) 11/18/2020   AKI (acute kidney injury) ([place]) 08/22/2020   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 08/22/2020   Pericardial effusion 08/22/2020   Acute on chronic diastolic CHF (congestive heart failure) ([place]) 08/22/2020   TIA (transient ischemic attack) 07/03/2020   Acute on chronic heart failure with preserved ejection fraction ([place])    Pulmonary artery hypertension ([place]) 06/25/2020   CKD (chronic kidney disease) 06/25/2020   History of cerebrovascular accident (CVA) with residual deficit 06/25/2020   CKD stage IIIa ([place]) 03/30/2020   Morbid (severe) obesity due to excess calories ([place]) 03/30/2020   Malnutrition of moderate degree 01/25/2020   Palliative care by specialist    Goals of care, counseling/discussion    Nephrotic syndrome 01/06/2020   Anasarca 01/06/2020   Hypokalemia due to loss of potassium 01/04/2020   Generalized body aches 01/01/2020   Non-traumatic rhabdomyolysis 01/01/2020   Chronic  diastolic CHF (congestive heart failure) ([place]) 01/01/2020   Microcytic anemia 01/01/2020   Hypokalemia 12/31/2019   Elevated CK    Diabetes mellitus type 2, uncontrolled    Pure hypercholesterolemia    Hyperglycemia 09/28/2017   Nonobstructive atherosclerosis of coronary artery 09/28/2017   MDD (major depressive disorder), recurrent episode, severe ([place]) 07/30/2017   Osteoarthritis 04/12/2015   Severe recurrent major depression with psychotic features ([place]) 01/04/2015   Substance-related disorder ([place]) 10/30/2014   Patient's noncompliance with other medical treatment and regimen 06/07/2014   Cocaine dependence, uncomplicated ([place]) 72/53/6644   Chronic pain 05/31/2014   Cocaine abuse ([place]) 05/31/2014   History of noncompliance with medical treatment 05/31/2014   Malingering 05/31/2014   Leukopenia 05/31/2014   Diabetic neuropathy associated with diabetes mellitus due to underlying condition ([place]) 05/31/2014   Diabetic neuropathy ([place]) 05/31/2014   Depression, major, recurrent, severe with psychosis ([place]) 08/29/2013   Suicidal ideations 08/28/2013   Uncontrolled diabetes mellitus 06/30/2013   Atypical chest pain 12/09/2011   Essential hypertension 12/09/2011   Episodic mood disorder ([place]) 05/13/2011    Orientation RESPIRATION BLADDER Height & Weight     Self, Time, Situation, Place  Normal Incontinent Weight: 281 lb 12 oz (127.8 kg) Height:  5\' 8"  (172.7 cm)  BEHAVIORAL SYMPTOMS/MOOD NEUROLOGICAL BOWEL NUTRITION STATUS      Incontinent Diet (Soft diet)  AMBULATORY STATUS COMMUNICATION OF NEEDS Skin   Limited Assist Verbally PU Stage and Appropriate Care                       Personal Care Assistance Level of Assistance  Bathing,  Feeding, Dressing Bathing Assistance: Limited assistance Feeding assistance: Independent Dressing Assistance: Limited assistance     Functional Limitations Info  Sight, Speech, Hearing Sight Info: Adequate Hearing Info: Adequate Speech  Info: Adequate    SPECIAL CARE FACTORS FREQUENCY  PT (By licensed PT), OT (By licensed OT)     PT Frequency: Minimum 5x a week OT Frequency: Minimum 5x a week            Contractures Contractures Info: Not present    Additional Factors Info  Allergies, Code Status, Insulin Sliding Scale Code Status Info: Full Code Allergies Info: Other   Insulin Sliding Scale Info: insulin aspart (novoLOG) injection 0-9 Units 3x a day with meals       Current Medications (12/05/2020):  This is the current hospital active medication list Current Facility-Administered Medications  Medication Dose Route Frequency Provider Last Rate Last Admin   (feeding supplement) PROSource Plus liquid 30 mL  30 mL Oral TID BM [name], [name], DO   30 mL at 12/05/20 1721   0.9 %  sodium chloride infusion   Intravenous Continuous Ronnette Juniper, MD       acetaminophen (TYLENOL) tablet 325 mg  325 mg Oral Q4H PRN Ronnette Juniper, MD   325 mg at 12/05/20 0549   amLODipine (NORVASC) tablet 10 mg  10 mg Oral Daily Ronnette Juniper, MD   10 mg at 12/05/20 1059   aspirin chewable tablet 81 mg  81 mg Oral Daily Ronnette Juniper, MD   81 mg at 12/05/20 1058   atorvastatin (LIPITOR) tablet 80 mg  80 mg Oral QHS Ronnette Juniper, MD   80 mg at 12/04/20 2208   carvedilol (COREG) tablet 12.5 mg  12.5 mg Oral BID WC Ronnette Juniper, MD   12.5 mg at 12/05/20 1727   chlorhexidine (PERIDEX) 0.12 % solution 15 mL  15 mL Mouth Rinse BID Ronnette Juniper, MD   15 mL at 12/05/20 1059   cloNIDine (CATAPRES - Dosed in mg/24 hr) patch 0.3 mg  0.3 mg Transdermal Weekly Ronnette Juniper, MD   0.3 mg at 12/01/20 1205   dextrose 5 % and 0.9 % NaCl with KCl 20 mEq/L infusion   Intravenous Continuous Ronnette Juniper, MD 50 mL/hr at 12/05/20 1518 New Bag at 12/05/20 1518   empagliflozin (JARDIANCE) tablet 10 mg  10 mg Oral Daily Ronnette Juniper, MD   10 mg at 12/05/20 1059   enoxaparin (LOVENOX) injection 60 mg  60 mg Subcutaneous Q24H Ronnette Juniper, MD   60 mg at 12/05/20 1310    feeding supplement (BOOST / RESOURCE BREEZE) liquid 1 Container  1 Container Oral TID BM [name], Omair [place], DO   1 Container at 12/05/20 1311   ferrous sulfate tablet 325 mg  325 mg Oral Q breakfast Ronnette Juniper, MD   325 mg at 12/05/20 0840   hydrALAZINE (APRESOLINE) injection 10 mg  10 mg Intravenous Q6H PRN Ronnette Juniper, MD   10 mg at 11/28/20 1029   insulin aspart (novoLOG) injection 0-5 Units  0-5 Units Subcutaneous QHS Raiford Noble [place], DO   2 Units at 12/04/20 2217   insulin aspart (novoLOG) injection 0-9 Units  0-9 Units Subcutaneous TID WC Raiford Noble [place], DO   2 Units at 12/05/20 1721   insulin glargine-yfgn (SEMGLEE) injection 5 Units  5 Units Subcutaneous Daily Ronnette Juniper, MD   5 Units at 12/05/20 1059   isosorbide-hydrALAZINE (BIDIL) 20-37.5 MG per tablet 1 tablet  1 tablet Oral TID Ronnette Juniper, MD  1 tablet at 12/05/20 1726   labetalol (NORMODYNE) injection 5 mg  5 mg Intravenous Q4H PRN Ronnette Juniper, MD   5 mg at 11/27/20 5830   lip balm (CARMEX) ointment   Topical PRN Ronnette Juniper, MD   75 application at 94/07/68 1528   MEDLINE mouth rinse  15 mL Mouth Rinse q12n4p Ronnette Juniper, MD   15 mL at 12/05/20 1722   multivitamin with minerals tablet 1 tablet  1 tablet Oral Daily Raiford Noble [place], DO   1 tablet at 12/05/20 1058   ondansetron (ZOFRAN) injection 4 mg  4 mg Intravenous Q8H PRN Ronnette Juniper, MD   4 mg at 12/01/20 1742   ondansetron (ZOFRAN) tablet 4 mg  4 mg Oral Q4H PRN Ronnette Juniper, MD       oxyCODONE (Oxy IR/ROXICODONE) immediate release tablet 5 mg  5 mg Oral Q4H PRN Dwyane Dee, MD       pantoprazole (PROTONIX) EC tablet 40 mg  40 mg Oral Daily Ronnette Juniper, MD   40 mg at 12/05/20 1059   phenol (CHLORASEPTIC) mouth spray 1 spray  1 spray Mouth/Throat PRN Ronnette Juniper, MD   1 spray at 11/27/20 2048   polyethylene glycol (MIRALAX / GLYCOLAX) packet 17 g  17 g Oral BID Ronnette Juniper, MD   17 g at 12/05/20 1059   potassium chloride SA (KLOR-CON) CR tablet 20 mEq  20 mEq  Oral BID Ronnette Juniper, MD   20 mEq at 12/05/20 1058   pregabalin (LYRICA) capsule 75 mg  75 mg Oral BID Ronnette Juniper, MD   75 mg at 12/05/20 1058   psyllium (HYDROCIL/METAMUCIL) 1 packet  1 packet Oral BID Ronnette Juniper, MD   1 packet at 12/05/20 1100   saccharomyces boulardii (FLORASTOR) capsule 250 mg  250 mg Oral BID Ronnette Juniper, MD   250 mg at 12/05/20 1058   sacubitril-valsartan (ENTRESTO) 49-51 mg per tablet  1 tablet Oral BID Ronnette Juniper, MD   1 tablet at 12/05/20 1059   senna-docusate (Senokot-S) tablet 1 tablet  1 tablet Oral Daily Ronnette Juniper, MD   1 tablet at 12/05/20 1058   sertraline (ZOLOFT) tablet 50 mg  50 mg Oral Daily Ronnette Juniper, MD   50 mg at 12/05/20 1059   tamsulosin (FLOMAX) capsule 0.8 mg  0.8 mg Oral Daily Ronnette Juniper, MD   0.8 mg at 12/05/20 1058   torsemide (DEMADEX) tablet 40 mg  40 mg Oral BID Ronnette Juniper, MD   40 mg at 12/05/20 1726     Discharge Medications: Please see discharge summary for a list of discharge medications.  Relevant Imaging Results:  Relevant Lab Results:   Additional Information SSN [id number]  Ross Ludwig, LCSW

## 2020-12-05 NOTE — Assessment & Plan Note (Addendum)
-   morning meds held on 10/13 due to severe hypoTN; now resolved - resumed on 10/14

## 2020-12-05 NOTE — Assessment & Plan Note (Signed)
-   Residual right-sided weakness from history of stroke

## 2020-12-05 NOTE — Progress Notes (Signed)
60 year old African-American male with  uncontrolled hypertension, type 2 diabetes mellitus, h/o stroke, CAD, HFpEF, with recurent ileus and Ogilvie syndrome.  General surgery is contemplating the use of neostigmine. I was asked to opine about his use of beta blocker. He is primarily on beta blocker for hypertension. Given his preserved LVEF, he does not have an absolute need to be on beta blocker form heart failure standpoint. Carvedilol can be held/stopped as long as needed. There is no contrindication for discontinuing this from cardiac standpoint. However, he may need altertnate antihyeprtensive medications, as needed.     Nigel Mormon, MD Pager: [phone] Office: [phone]

## 2020-12-05 NOTE — Progress Notes (Signed)
Progress Note    [name]   IXB:[id number]  DOB: Nov 14, 1960  DOA: 11/18/2020     16 Date of Service: 12/05/2020   [name] is a 60 yo male with PMH obesity, CHF, HTN, CVA (right sided residual weakness), DMII, neuropathy, CKD3a, polysubstance use, chronic pain, depression, HLD who initially presented to the hospital with abdominal pain. Workup showed significant gastric distention of the colon.  GI and general surgery were consulted on admission. He has a history of having undergone bowel decompression in 2021 as well. He underwent first decompression on 11/19/2020 followed by NG tube placement.  Due to minimal improvement, he underwent repeat decompression on 12/03/2020.  Subjective:  No events overnight. Denies N/V. Pain is minimal. Endorses some flatus.   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; electrolytes being replaced as indicated - s/p colonic decompression on 11/19/20 and 12/03/20 - colon remains dilated; he does endorse some flatus and BMs but mobility extremely limited still - GI and surgery following - continue laxatives as ordered; rectal tube in place - consideration of neostigmine recommended per surgery; his only relative contraindication I note is use of a BB (coreg) due to his history of dCHF; however, given his GDMT regimen, I will ask for formal cardiac clearance as well   Chronic diastolic CHF (congestive heart failure) ([name]) - Last echo 08/22/2020: EF 65 to 08%, grade 2 diastolic dysfunction -Continue aspirin, Lipitor, Coreg, BiDil, Entresto, Jardiance - no s/s exacerbation at this time   Hypokalemia - goal K>4  Pressure injury of skin - Residual right-sided weakness from history of stroke  BPH (benign prostatic hyperplasia) - Continue Flomax  Acute renal failure superimposed on stage 3a chronic kidney disease ([name]) - patient has history of CKD3a. Baseline creat ~ 1.6 - 1.7, eGFR 49-51 - patient presents with  increase in creat >0.3 mg/dL above baseline, creat increase >1.5x baseline presumed to have occurred within past 7 days PTA - creat peaked at 2.11 after admission - continues to improve - BMP daily   Depression - Continue Zoloft  History of cerebrovascular accident (CVA) with residual deficit - Residual right-sided weakness -Continue aspirin and statin  Microcytic anemia - hx IDA; low stores on 08/23/20 check - baseline Hgb 9-10 g/dL - also some contribution from CKD as well -Continue oral iron - currently at baseline   Diabetes mellitus type 2, uncontrolled - Last A1c 9.1% on 06/25/2020 - Continue SSI and CBG monitoring  Essential hypertension - continue current regimen   Abdominal pain, generalized-resolved as of 12/05/2020 - see Ogilvie   Objective Vital signs were reviewed and unremarkable.  Vitals:   12/04/20 2032 12/05/20 0430 12/05/20 0838 12/05/20 1057  BP: 103/67 (!) 107/53 95/66 109/68  Pulse: 66 68 70 72  Resp: _0 Temp: 97.7 F (36.5 C) 97.9 F (36.6 C) 97.7 F (36.5 C)   TempSrc: Oral Oral Oral   SpO2: 100% 100% 100%   Weight:      Height:       127.8 kg  Exam General appearance: alert, cooperative, and no distress Head: Normocephalic, without obvious abnormality, atraumatic Eyes:  EOMI Lungs: clear to auscultation bilaterally Heart: regular rate and rhythm and S1, S2 normal Abdomen:  distended and mildly firm; hypoactive BS; no TTP Extremities:  no edema Skin: mobility and turgor normal Neurologic: 4/5 right sided strength   Labs / Other Information My review of labs, imaging, notes and other tests shows no new significant  findings.    Time spent: Greater than 50% of the 35 minute visit was spent in counseling/coordination of care for the patient as laid out in the A&P.   , MD [facility] 12/05/2020, 1:06 PM    

## 2020-12-05 NOTE — Assessment & Plan Note (Signed)
-   Residual right-sided weakness -Continue aspirin and statin

## 2020-12-05 NOTE — TOC Progression Note (Signed)
Transition of Care [facility]) - Progression Note    Patient Details  Name: [name] MRN: [id number] Date of Birth: 08/23/1960  [facility] CM/SW Contact  Ross Ludwig, [place] Phone Number: 12/05/2020, 6:09 PM  Clinical Narrative:    Patient still not medically ready for discharge.  Plan to return back to [place] once medically ready for discharge.   Expected Discharge Plan: Home/Self Care Barriers to Discharge: Continued Medical Work up  Expected Discharge Plan and Services Expected Discharge Plan: Home/Self Care   Discharge Planning Services: CM Consult   Living arrangements for the past 2 months: Single Family Home                                       Social Determinants of Health (SDOH) Interventions    Readmission Risk Interventions Readmission Risk Prevention Plan 01/12/2020  Transportation Screening Complete  Palliative Care Screening Not Applicable  Some recent data might be hidden

## 2020-12-05 NOTE — Hospital Course (Signed)
Mr. Hlavacek is a 60 yo male with PMH obesity, CHF, HTN, CVA (right sided residual weakness), DMII, neuropathy, CKD3a, polysubstance use, chronic pain, depression, HLD who initially presented to the hospital with abdominal pain. Workup showed significant gastric distention of the colon.  GI and general surgery were consulted on admission. He has a history of having undergone bowel decompression in 2021 as well. He underwent first decompression on 11/19/2020 followed by NG tube placement.  Due to minimal improvement, he underwent repeat decompression on 12/03/2020.

## 2020-12-05 NOTE — Assessment & Plan Note (Signed)
-   hx IDA; low stores on 08/23/20 check - baseline Hgb 9-10 g/dL - also some contribution from CKD as well -Continue oral iron - currently at baseline

## 2020-12-05 NOTE — Assessment & Plan Note (Addendum)
-   goal K>4, Mg>2

## 2020-12-05 NOTE — Progress Notes (Signed)
   12/05/20 1724  Pain Assessment  Pain Scale 0-10  Pain Score 8  Pain Type Acute pain  Pain Location Buttocks  Pain Orientation Right  Pain Descriptors / Indicators Discomfort  Pain Frequency Intermittent  Pain Intervention(s) MD notified (Comment) (Secure Chat sent to Dr. Sabino Gasser)   Sent secure chat Epic message to Dr. Sabino Gasser for 8/10 pain and patient is requesting something stronger than tylenol.

## 2020-12-05 NOTE — Assessment & Plan Note (Signed)
-   see Carita Pian

## 2020-12-05 NOTE — Assessment & Plan Note (Signed)
Continue Zoloft 

## 2020-12-05 NOTE — Assessment & Plan Note (Addendum)
-  patient has history of CKD3a. Baseline creat ~ 1.6 - 1.7, eGFR 49-51 - patient presents with increase in creat >0.3 mg/dL above baseline, creat increase >1.5x baseline presumed to have occurred within past 7 days PTA - creat peaked at 2.11 after admission - continues to improve; creatinine 1.37 at time of discharge

## 2020-12-05 NOTE — Assessment & Plan Note (Addendum)
-   Initially had persistent bowel dilation on serial imaging; electrolytes being replaced as indicated - s/p colonic decompression on 11/19/20 and 12/03/20 -Underwent neostigmine administration on 12/06/2020.  Good response with several bowel movements and improvement in abdominal distention - GI and surgery followed - continue laxatives -High risk of recurrent pseudoobstruction due to bedbound/wheelchair-bound status.  Continue checking electrolytes intermittently and continue bowel regimen and avoiding constipation. -May still require surgical intervention in the future if recurs

## 2020-12-05 NOTE — Assessment & Plan Note (Signed)
-   Continue Flomax 

## 2020-12-05 NOTE — Progress Notes (Signed)
2 Days Post-Op   Subjective/Chief Complaint: Asked to reeval pt for atonic colon Pt has had decompression x2. Pt feeling less distended now and tol PO reg diet Pt con't with BMs   Objective: Vital signs in last 24 hours: Temp:  [97.7 F (36.5 C)-98.4 F (36.9 C)] 97.7 F (36.5 C) (10/11 0838) Pulse Rate:  [66-72] 72 (10/11 1057) Resp:  [17-18] 17 (10/11 0838) BP: (95-144)/(53-91) 109/68 (10/11 1057) SpO2:  [100 %] 100 % (10/11 0838) Last BM Date: 12/04/20 (rectal tube)  Intake/Output from previous day: 10/10 0701 - 10/11 0700 In: 1345.1 [P.O.:120; I.V.:1225.1] Out: 250 [Urine:250] Intake/Output this shift: No intake/output data recorded.  PE:  Constitutional: No acute distress, conversant, appears states age. Eyes: Anicteric sclerae, moist conjunctiva, no lid lag Lungs: Clear to auscultation bilaterally, normal respiratory effort CV: regular rate and rhythm, no murmurs, no peripheral edema, pedal pulses 2+ GI: Soft, no masses or hepatosplenomegaly, non-tender to palpation, distended. No rebound/guarding Skin: No rashes, palpation reveals normal turgor Psychiatric: appropriate judgment and insight, oriented to person, place, and time   Lab Results:  Recent Labs    12/04/20 1317 12/05/20 0832  WBC 3.1* 3.3*  HGB 9.3* 9.3*  HCT 29.6* 29.9*  PLT 275 283   BMET Recent Labs    12/04/20 1317 12/05/20 0832  NA 133* 132*  K 4.1 4.6  CL 104 105  CO2 23 22  GLUCOSE 209* 176*  BUN 18 17  CREATININE 1.31* 1.42*  CALCIUM 8.0* 7.8*   PT/INR No results for input(s): LABPROT, INR in the last 72 hours. ABG No results for input(s): PHART, HCO3 in the last 72 hours.  Invalid input(s): PCO2, PO2  Studies/Results: DG Abd 1 View  Result Date: 12/04/2020 CLINICAL DATA:  Abdominal distension. EXAM: ABDOMEN - 1 VIEW COMPARISON:  December 03, 2020. FINDINGS: No small bowel dilatation is noted. No abnormal calcifications are noted. There appears to be mildly decreased  gaseous distention of the colon. IMPRESSION: Mildly decreased gaseous distention of the colon is noted suggesting improving ileus. Electronically Signed   By: Marijo Conception M.D.   On: 12/04/2020 15:44       Assessment/Plan: Colonic Ogilvie's  -s/p c-scope decompression. -Pt currently Tol PO and con't to have BMs.  Pt has had 2 decompressions with GI.   Pt may benefit from neostigmine if no contraindications.  I think maximizing medical tx would be ideal in his situation prior to considering surgery.  His lack of mobility will likely limit his probability of resolution and contribute to his recurrence of ileus in the future.      FEN - Reg diet/IVFs VTE - ok for prophylaxis from our standpoint ID - none currently needed   CHF DM HTN H/O CVA CKD Polysubstance abuse   LOS: 16 days    Ralene Ok 12/05/2020

## 2020-12-06 ENCOUNTER — Inpatient Hospital Stay (HOSPITAL_COMMUNITY): Payer: Medicare HMO

## 2020-12-06 DIAGNOSIS — K5981 Ogilvie syndrome: Secondary | ICD-10-CM | POA: Diagnosis not present

## 2020-12-06 LAB — GLUCOSE, CAPILLARY
Glucose-Capillary: 154 mg/dL — ABNORMAL HIGH (ref 70–99)
Glucose-Capillary: 196 mg/dL — ABNORMAL HIGH (ref 70–99)
Glucose-Capillary: 161 mg/dL — ABNORMAL HIGH (ref 70–99)
Glucose-Capillary: 142 mg/dL — ABNORMAL HIGH (ref 70–99)

## 2020-12-06 LAB — MRSA NEXT GEN BY PCR, NASAL: MRSA by PCR Next Gen: NOT DETECTED

## 2020-12-06 MED ORDER — CHLORHEXIDINE GLUCONATE CLOTH 2 % EX PADS
6.0000 | MEDICATED_PAD | Freq: Every day | CUTANEOUS | Status: DC
  Administered 2020-12-06 – 2020-12-11 (×6): 6 via TOPICAL

## 2020-12-06 MED ORDER — ATROPINE SULFATE 1 MG/10ML IJ SOSY: 1.0000 mg | PREFILLED_SYRINGE | INTRAMUSCULAR | Status: DC | PRN

## 2020-12-06 MED ORDER — NEOSTIGMINE METHYLSULFATE 10 MG/10ML IV SOLN
5.0000 mg | Freq: Once | INTRAVENOUS | Status: AC
  Administered 2020-12-06: 5 mg via INTRAVENOUS
  Filled 2020-12-06: qty 5

## 2020-12-06 MED ORDER — ATROPINE SULFATE 1 MG/10ML IJ SOSY
PREFILLED_SYRINGE | INTRAMUSCULAR | Status: AC
  Filled 2020-12-06: qty 10

## 2020-12-06 MED ORDER — PROCHLORPERAZINE EDISYLATE 10 MG/2ML IJ SOLN
5.0000 mg | Freq: Once | INTRAMUSCULAR | Status: AC
  Administered 2020-12-06: 5 mg via INTRAVENOUS
  Filled 2020-12-06: qty 2

## 2020-12-06 MED ORDER — EPINEPHRINE 1 MG/10ML IJ SOSY: 1.0000 mg | PREFILLED_SYRINGE | INTRAMUSCULAR | Status: DC | PRN

## 2020-12-06 NOTE — Progress Notes (Signed)
Subjective: Patient siting in chair.  She was started on soft diet x 2 days  Patient had 1 episode of vomiting early this AM, but did eat egg and biscuit afterwards which he states he is doing fine now.  States he is doing well other than the chair is uncomfortable. Patient denies worsening abdominal pain.  Denies bowel movements but states he is passing gas.   Objective: Vital signs in last 24 hours: Temp:  [97.6 F (36.4 C)] 97.6 F (36.4 C) (10/12 0421) Pulse Rate:  [81-82] 81 (10/12 0421) Resp:  [18] 18 (10/12 0421) BP: (108-129)/(70-80) 123/77 (10/12 0421) SpO2:  [100 %] 100 % (10/12 0421) Weight change:  Last BM Date: 12/04/20  PE: Chronically ill-appearing, bedbound GENERAL: no distress, able to speak in full sentences  ABDOMEN: Distended but soft and improved, likely at baseline, bowel sounds audible but sluggish, nontender AB EXTREMITIES: Bilateral lower extremities with pressure support boots  Lab Results: Results for orders placed or performed during the hospital encounter of 11/18/20 (from the past 48 hour(s))  Glucose, capillary     Status: Abnormal   Collection Time: 12/04/20 11:46 AM  Result Value Ref Range   Glucose-Capillary 213 (H) 70 - 99 mg/dL    Comment: Glucose reference range applies only to samples taken after fasting for at least 8 hours.  Comprehensive metabolic panel     Status: Abnormal   Collection Time: 12/04/20  1:17 PM  Result Value Ref Range   Sodium 133 (L) 135 - 145 mmol/L   Potassium 4.1 3.5 - 5.1 mmol/L   Chloride 104 98 - 111 mmol/L   CO2 23 22 - 32 mmol/L   Glucose, Bld 209 (H) 70 - 99 mg/dL    Comment: Glucose reference range applies only to samples taken after fasting for at least 8 hours.   BUN 18 6 - 20 mg/dL   Creatinine, Ser 1.31 (H) 0.61 - 1.24 mg/dL   Calcium 8.0 (L) 8.9 - 10.3 mg/dL   Total Protein 6.7 6.5 - 8.1 g/dL   Albumin 2.6 (L) 3.5 - 5.0 g/dL   AST 21 15 - 41 U/L   ALT 24 0 - 44 U/L   Alkaline Phosphatase 281 (H) 38  - 126 U/L   Total Bilirubin 0.7 0.3 - 1.2 mg/dL   GFR, Estimated >60 >60 mL/min    Comment: (NOTE) Calculated using the CKD-EPI Creatinine Equation (2021)    Anion gap 6 5 - 15    Comment: Performed at [facility], [place] [address] [place], [place] [address]  CBC with Differential/Platelet     Status: Abnormal   Collection Time: 12/04/20  1:17 PM  Result Value Ref Range   WBC 3.1 (L) 4.0 - 10.5 K/uL   RBC 3.65 (L) 4.22 - 5.81 MIL/uL   Hemoglobin 9.3 (L) 13.0 - 17.0 g/dL   HCT 29.6 (L) 39.0 - 52.0 %   MCV 81.1 80.0 - 100.0 fL   MCH 25.5 (L) 26.0 - 34.0 pg   MCHC 31.4 30.0 - 36.0 g/dL   RDW 15.9 (H) 11.5 - 15.5 %   Platelets 275 150 - 400 K/uL   nRBC 0.0 0.0 - 0.2 %   Neutrophils Relative % 43 %   Neutro Abs 1.3 (L) 1.7 - 7.7 K/uL   Lymphocytes Relative 37 %   Lymphs Abs 1.2 0.7 - 4.0 K/uL   Monocytes Relative 17 %   Monocytes Absolute 0.5 0.1 - 1.0 K/uL   Eosinophils Relative  2 %   Eosinophils Absolute 0.1 0.0 - 0.5 K/uL   Basophils Relative 0 %   Basophils Absolute 0.0 0.0 - 0.1 K/uL   Immature Granulocytes 1 %   Abs Immature Granulocytes 0.02 0.00 - 0.07 K/uL    Comment: Performed at [facility], [place] [address], [place], [place] [address]  Magnesium     Status: None   Collection Time: 12/04/20  1:17 PM  Result Value Ref Range   Magnesium 2.1 1.7 - 2.4 mg/dL    Comment: Performed at [facility], [place] [address], [place], [place] [address]  Phosphorus     Status: None   Collection Time: 12/04/20  1:17 PM  Result Value Ref Range   Phosphorus 2.7 2.5 - 4.6 mg/dL    Comment: Performed at [facility], [place] [address], [place], [place] [address]  Glucose, capillary     Status: Abnormal   Collection Time: 12/04/20  6:39 PM  Result Value Ref Range   Glucose-Capillary 199 (H) 70 - 99 mg/dL    Comment: Glucose reference range applies only to samples taken after fasting for at least 8 hours.  Glucose,  capillary     Status: Abnormal   Collection Time: 12/04/20  9:52 PM  Result Value Ref Range   Glucose-Capillary 213 (H) 70 - 99 mg/dL    Comment: Glucose reference range applies only to samples taken after fasting for at least 8 hours.  Glucose, capillary     Status: Abnormal   Collection Time: 12/05/20  8:17 AM  Result Value Ref Range   Glucose-Capillary 159 (H) 70 - 99 mg/dL    Comment: Glucose reference range applies only to samples taken after fasting for at least 8 hours.  CBC with Differential/Platelet     Status: Abnormal   Collection Time: 12/05/20  8:32 AM  Result Value Ref Range   WBC 3.3 (L) 4.0 - 10.5 K/uL   RBC 3.75 (L) 4.22 - 5.81 MIL/uL   Hemoglobin 9.3 (L) 13.0 - 17.0 g/dL   HCT 29.9 (L) 39.0 - 52.0 %   MCV 79.7 (L) 80.0 - 100.0 fL   MCH 24.8 (L) 26.0 - 34.0 pg   MCHC 31.1 30.0 - 36.0 g/dL   RDW 15.9 (H) 11.5 - 15.5 %   Platelets 283 150 - 400 K/uL   nRBC 0.0 0.0 - 0.2 %   Neutrophils Relative % 47 %   Neutro Abs 1.5 (L) 1.7 - 7.7 K/uL   Lymphocytes Relative 33 %   Lymphs Abs 1.1 0.7 - 4.0 K/uL   Monocytes Relative 16 %   Monocytes Absolute 0.5 0.1 - 1.0 K/uL   Eosinophils Relative 3 %   Eosinophils Absolute 0.1 0.0 - 0.5 K/uL   Basophils Relative 0 %   Basophils Absolute 0.0 0.0 - 0.1 K/uL   Immature Granulocytes 1 %   Abs Immature Granulocytes 0.02 0.00 - 0.07 K/uL   Polychromasia PRESENT    Ovalocytes PRESENT     Comment: Performed at [facility], [place] [address] [place], [place] [address]  Comprehensive metabolic panel     Status: Abnormal   Collection Time: 12/05/20  8:32 AM  Result Value Ref Range   Sodium 132 (L) 135 - 145 mmol/L   Potassium 4.6 3.5 - 5.1 mmol/L   Chloride 105 98 - 111 mmol/L   CO2 22 22 - 32 mmol/L   Glucose, Bld 176 (H) 70 - 99 mg/dL  Comment: Glucose reference range applies only to samples taken after fasting for at least 8 hours.   BUN 17 6 - 20 mg/dL   Creatinine, Ser 1.42 (H) 0.61 - 1.24 mg/dL    Calcium 7.8 (L) 8.9 - 10.3 mg/dL   Total Protein 6.3 (L) 6.5 - 8.1 g/dL   Albumin 2.6 (L) 3.5 - 5.0 g/dL   AST 13 (L) 15 - 41 U/L   ALT 17 0 - 44 U/L   Alkaline Phosphatase 252 (H) 38 - 126 U/L   Total Bilirubin 0.4 0.3 - 1.2 mg/dL   GFR, Estimated 57 (L) >60 mL/min    Comment: (NOTE) Calculated using the CKD-EPI Creatinine Equation (2021)    Anion gap 5 5 - 15    Comment: Performed at [facility], [place] [address] [place], [place] [address]  Magnesium     Status: None   Collection Time: 12/05/20  8:32 AM  Result Value Ref Range   Magnesium 1.9 1.7 - 2.4 mg/dL    Comment: Performed at [facility], [place] [address], [place], [place] [address]  Phosphorus     Status: None   Collection Time: 12/05/20  8:32 AM  Result Value Ref Range   Phosphorus 2.5 2.5 - 4.6 mg/dL    Comment: Performed at [facility], [place] [address] [place], [place] [address]  Glucose, capillary     Status: Abnormal   Collection Time: 12/05/20 10:56 AM  Result Value Ref Range   Glucose-Capillary 186 (H) 70 - 99 mg/dL    Comment: Glucose reference range applies only to samples taken after fasting for at least 8 hours.  Glucose, capillary     Status: Abnormal   Collection Time: 12/05/20  1:06 PM  Result Value Ref Range   Glucose-Capillary 181 (H) 70 - 99 mg/dL    Comment: Glucose reference range applies only to samples taken after fasting for at least 8 hours.  Glucose, capillary     Status: Abnormal   Collection Time: 12/05/20  4:35 PM  Result Value Ref Range   Glucose-Capillary 154 (H) 70 - 99 mg/dL    Comment: Glucose reference range applies only to samples taken after fasting for at least 8 hours.  Glucose, capillary     Status: Abnormal   Collection Time: 12/05/20 10:09 PM  Result Value Ref Range   Glucose-Capillary 166 (H) 70 - 99 mg/dL    Comment: Glucose reference range applies only to samples taken after fasting for at least 8 hours.  Glucose,  capillary     Status: Abnormal   Collection Time: 12/06/20  7:54 AM  Result Value Ref Range   Glucose-Capillary 154 (H) 70 - 99 mg/dL    Comment: Glucose reference range applies only to samples taken after fasting for at least 8 hours.    Studies/Results: DG Abd 1 View  Result Date: 12/04/2020 CLINICAL DATA:  Abdominal distension. EXAM: ABDOMEN - 1 VIEW COMPARISON:  December 03, 2020. FINDINGS: No small bowel dilatation is noted. No abnormal calcifications are noted. There appears to be mildly decreased gaseous distention of the colon. IMPRESSION: Mildly decreased gaseous distention of the colon is noted suggesting improving ileus. Electronically Signed   By: Marijo Conception M.D.   On: 12/04/2020 15:44    Medications: I have reviewed the patient's current medications.  Assessment: Adynamic ileus-abdominal x-ray today showed diffuse gaseous distention, prominent transverse colon measuring 11.6 cm, no pneumatosis or pneumoperitoneum, worsening adynamic ileus KUB 12/04/2020  Mildly decreased gaseous distention of the colon is noted suggesting improving ileus.AB softer today on exam.   Potassium 4.6, magnesium 1.9 Microcytic anemia, hemoglobin 9.3, MCV 79.7  Plan: s/p colonic depression 12/03/2020 with Dr. Therisa Doyne KUB 12/04/20 showed slight improvement in ileus Continue Metamucil twice a day and MiraLAX twice a day. Continue to maintain magnesium above 2 and potassium at 4-4.5.  Appreciate Gen surgery input for patient with chronic adynamic ileus.  Continue soft diet as tolerated.    Vladimir Crofts, PA-C 12/06/2020, 10:59 AM

## 2020-12-06 NOTE — Plan of Care (Signed)
Patient transferred to SDU this afternoon to administer neostigmine.   Neostigmine 5 mg ordered.  Administered at 3:17 pm by Ander Purpura, RN.  I was present bedside during the 4-5 minute push.  Starting HR 81 bpm.  1505: Initial EKG NSR with HR 80.  1544: 2nd EKG NSR, HR 72. TWI V4-V6 1621: 3rd EKG NSR. Same TWI but improved/more subtle No CP complaints.  HR nadir mid 60s during the 30 minute post administration period. No atropine required.   Patient had a few vomiting episodes, excessive sweating, increased urination, and subjective sensation of 4 bowel movements but no stool seen in [place] (patient had not yet been inspected on his backside)  Abdominal xray obtained ~1 hour after neo but no significant improvement; xray to be repeated in am.   Dwyane Dee, MD [facility] 12/06/2020, 5:06 PM

## 2020-12-06 NOTE — Plan of Care (Signed)
Discussed with patient plan of care for the evening, pain management and IV fluids with some teach back displayed.  We talked about flex-I-sel and potassium levels.  Problem: Education: Goal: Knowledge of General Education information will improve Description: Including pain rating scale, medication(s)/side effects and non-pharmacologic comfort measures Outcome: Progressing

## 2020-12-06 NOTE — Progress Notes (Signed)
Patient ID: [name], male   DOB: 12/22/1960, 60 y.o.   MRN: [id number] [facility] Progress Note  3 Days Post-Op  Subjective: CC-  Up in chair. No complaints this morning. Denies abdominal pain, bloating, nausea, vomiting. Passing flatus, cannot remember when his last BM was. States that he did vomit once last night but thinks that he just overdid it with taking in liquids.   Objective: Vital signs in last 24 hours: Temp:  [97.6 F (36.4 C)] 97.6 F (36.4 C) (10/12 0421) Pulse Rate:  [72-82] 81 (10/12 0421) Resp:  [18] 18 (10/12 0421) BP: (108-129)/(68-80) 123/77 (10/12 0421) SpO2:  [100 %] 100 % (10/12 0421) Last BM Date: 12/04/20  Intake/Output from previous day: 10/11 0701 - 10/12 0700 In: 1304.6 [P.O.:480; I.V.:824.6] Out: 1300 [Urine:1300] Intake/Output this shift: Total I/O In: 480 [P.O.:480] Out: -   PE: Gen: Alert, NAD Lungs: rate and effort normal Abd: some distension, soft, nontender, hypoactive BS  Lab Results:  Recent Labs    12/04/20 1317 12/05/20 0832  WBC 3.1* 3.3*  HGB 9.3* 9.3*  HCT 29.6* 29.9*  PLT 275 283   BMET Recent Labs    12/04/20 1317 12/05/20 0832  NA 133* 132*  K 4.1 4.6  CL 104 105  CO2 23 22  GLUCOSE 209* 176*  BUN 18 17  CREATININE 1.31* 1.42*  CALCIUM 8.0* 7.8*   PT/INR No results for input(s): LABPROT, INR in the last 72 hours. CMP     Component Value Date/Time   NA 132 (L) 12/05/2020 0832   NA 135 (L) 12/31/2013 1336   K 4.6 12/05/2020 0832   K 3.8 12/31/2013 1336   CL 105 12/05/2020 0832   CL 101 12/31/2013 1336   CO2 22 12/05/2020 0832   CO2 25 12/31/2013 1336   GLUCOSE 176 (H) 12/05/2020 0832   GLUCOSE 249 (H) 12/31/2013 1336   BUN 17 12/05/2020 0832   BUN 8 12/31/2013 1336   CREATININE 1.42 (H) 12/05/2020 0832   CREATININE 0.88 12/31/2013 1336   CALCIUM 7.8 (L) 12/05/2020 0832   CALCIUM 8.6 12/31/2013 1336   PROT 6.3 (L) 12/05/2020 0832   PROT 7.8 12/31/2013 1336   ALBUMIN 2.6 (L)  12/05/2020 0832   ALBUMIN 3.4 12/31/2013 1336   AST 13 (L) 12/05/2020 0832   AST 23 12/31/2013 1336   ALT 17 12/05/2020 0832   ALT 20 12/31/2013 1336   ALKPHOS 252 (H) 12/05/2020 0832   ALKPHOS 76 12/31/2013 1336   BILITOT 0.4 12/05/2020 0832   BILITOT 0.6 12/31/2013 1336   GFRNONAA 57 (L) 12/05/2020 0832   GFRNONAA >60 12/31/2013 1336   GFRAA >60 01/29/2018 1705   GFRAA >60 12/31/2013 1336   Lipase     Component Value Date/Time   LIPASE 21 11/18/2020 1503       Studies/Results: DG Abd 1 View  Result Date: 12/04/2020 CLINICAL DATA:  Abdominal distension. EXAM: ABDOMEN - 1 VIEW COMPARISON:  December 03, 2020. FINDINGS: No small bowel dilatation is noted. No abnormal calcifications are noted. There appears to be mildly decreased gaseous distention of the colon. IMPRESSION: Mildly decreased gaseous distention of the colon is noted suggesting improving ileus. Electronically Signed   By: Marijo Conception M.D.   On: 12/04/2020 15:44    Anti-infectives: Anti-infectives (From admission, onward)    None        Assessment/Plan Colonic Ogilvie's  - s/p c-scope decompression x2 - We were asked to see again for consideration of  either diverting colostomy ileostomy partial colectomy. Patient is currently tolerating PO and having bowel function. GI is following and he is on a bowel regimen. Would recommend maximizing medical therapy prior to considering surgery. If not improving with current regimen would consider use of neostigmine - Cardiology has been asked to weight in regarding use of this.   FEN - soft diet VTE - lovenox ID - none currently needed   CHF DM HTN H/O CVA CKD Polysubstance abuse   LOS: 17 days    Wellington Hampshire, [facility] 12/06/2020, 10:47 AM Please see Amion for pager number during day hours 7:00am-4:30pm

## 2020-12-06 NOTE — Progress Notes (Signed)
Progress Note    [name]   SVX:[id number]  DOB: [birth year]  DOA: 11/18/2020     17 Date of Service: 12/06/2020   [name] is a 60 yo male with PMH obesity, CHF, HTN, CVA (right sided residual weakness), DMII, neuropathy, CKD3a, polysubstance use, chronic pain, depression, HLD who initially presented to the hospital with abdominal pain. Workup showed significant gastric distention of the colon.  GI and general surgery were consulted on admission. He has a history of having undergone bowel decompression in 2021 as well. He underwent first decompression on 11/19/2020 followed by NG tube placement.  Due to minimal improvement, he underwent repeat decompression on 12/03/2020.  Subjective:  No events overnight however he endorsed having an episode of vomiting this morning.  Abdomen remains still distended and he is somewhat uncomfortable laying in the recliner.  He is amenable for trial of neostigmine after transfer to stepdown unit.  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  Coreg discontinued, no indication to resume.  No other relative contraindications for neostigmine - plan for transfer to SDU then administration of neostigmine and assess response - depending on clinical course, might still warrant surgical intervention  Chronic diastolic CHF (congestive heart failure) ([place]) - Last echo 08/22/2020: EF 65 to 23%, grade 2 diastolic dysfunction -Continue aspirin, Lipitor, BiDil, Entresto, Jardiance - d/c coreg; discussed  with cardiology  - no s/s exacerbation at this time   Hypokalemia - goal K>4  Pressure injury of skin - Residual right-sided weakness from history of stroke  BPH (benign prostatic hyperplasia) - Continue Flomax  Acute renal failure superimposed on stage 3a chronic kidney disease ([place]) - patient has history of CKD3a. Baseline creat ~ 1.6 - 1.7, eGFR 49-51 - patient presents with increase in creat >0.3 mg/dL above baseline, creat increase >1.5x baseline presumed to have occurred within past 7 days PTA - creat peaked at 2.11 after admission - continues to improve - BMP daily   Depression - Continue Zoloft  History of cerebrovascular accident (CVA) with residual deficit - Residual right-sided weakness -Continue aspirin and statin  Microcytic anemia - hx IDA; low stores on 08/23/20 check - baseline Hgb 9-10 g/dL - also some contribution from CKD as well -Continue oral iron - currently at baseline   Diabetes mellitus type 2, uncontrolled - Last A1c 9.1% on 06/25/2020 - Continue SSI and CBG monitoring  Essential hypertension - continue current regimen   Abdominal pain, generalized-resolved as of 12/05/2020 - see Ogilvie    Objective Vital signs were reviewed and unremarkable.  Vitals:   12/05/20 1057 12/05/20 1724 12/05/20 2213 12/06/20 0421  BP: 109/68 129/80 108/70 123/77  Pulse: 72 82 82 81  Resp:   18 18  Temp:   97.6 F (36.4 C) 97.6 F (36.4 C)  TempSrc:   Oral Oral  SpO2:   100% 100%  Weight:      Height:       127.8 kg  Exam Physical Exam Constitutional:      Appearance: Normal appearance.  HENT:     Head: Normocephalic and atraumatic.  Mouth/Throat:     Mouth: Mucous membranes are moist.  Eyes:     Extraocular Movements: Extraocular movements intact.  Cardiovascular:     Rate and Rhythm: Normal rate and regular rhythm.     Heart sounds: Normal heart sounds.  Pulmonary:     Effort: Pulmonary effort is normal.     Breath sounds: Normal breath  sounds.  Abdominal:     General: Bowel sounds are decreased.     Tenderness: There is no abdominal tenderness. There is no guarding or rebound.     Comments: Abdomen still rather distended  Musculoskeletal:        General: Normal range of motion.     Cervical back: Normal range of motion and neck supple.  Skin:    General: Skin is warm and dry.  Neurological:     Mental Status: He is alert and oriented to person, place, and time.     Comments: 4/5 RUE/RLE strength  Psychiatric:        Mood and Affect: Mood normal.        Behavior: Behavior normal.     Labs / Other Information My review of labs, imaging, notes and other tests shows no new significant findings.     Time spent: Greater than 50% of the 35 minute visit was spent in counseling/coordination of care for the patient as laid out in the A&P.  Dwyane Dee, MD [facility] 12/06/2020, 2:33 PM

## 2020-12-06 NOTE — Progress Notes (Signed)
Pt dressing change to sacrum, heel off loading boots in place to BLE, heel assessments unchanged from previous assessment, SCDs in place.

## 2020-12-07 ENCOUNTER — Inpatient Hospital Stay (HOSPITAL_COMMUNITY): Payer: Medicare HMO

## 2020-12-07 DIAGNOSIS — K5981 Ogilvie syndrome: Secondary | ICD-10-CM | POA: Diagnosis not present

## 2020-12-07 DIAGNOSIS — I959 Hypotension, unspecified: Secondary | ICD-10-CM

## 2020-12-07 LAB — BASIC METABOLIC PANEL
Anion gap: 7 (ref 5–15)
BUN: 21 mg/dL — ABNORMAL HIGH (ref 6–20)
CO2: 19 mmol/L — ABNORMAL LOW (ref 22–32)
Calcium: 8.2 mg/dL — ABNORMAL LOW (ref 8.9–10.3)
Chloride: 111 mmol/L (ref 98–111)
Creatinine, Ser: 1.51 mg/dL — ABNORMAL HIGH (ref 0.61–1.24)
GFR, Estimated: 53 mL/min — ABNORMAL LOW (ref >60)
Glucose, Bld: 144 mg/dL — ABNORMAL HIGH (ref 70–99)
Potassium: 4.8 mmol/L (ref 3.5–5.1)
Sodium: 137 mmol/L (ref 135–145)

## 2020-12-07 LAB — CBC WITH DIFFERENTIAL/PLATELET
Abs Immature Granulocytes: 0.02 10*3/uL (ref 0.00–0.07)
Basophils Absolute: 0 10*3/uL (ref 0.0–0.1)
Basophils Relative: 0 %
Eosinophils Absolute: 0.1 10*3/uL (ref 0.0–0.5)
Eosinophils Relative: 3 %
HCT: 26.3 % — ABNORMAL LOW (ref 39.0–52.0)
Hemoglobin: 8.1 g/dL — ABNORMAL LOW (ref 13.0–17.0)
Immature Granulocytes: 1 %
Lymphocytes Relative: 32 %
Lymphs Abs: 1.1 10*3/uL (ref 0.7–4.0)
MCH: 25.3 pg — ABNORMAL LOW (ref 26.0–34.0)
MCHC: 30.8 g/dL (ref 30.0–36.0)
MCV: 82.2 fL (ref 80.0–100.0)
Monocytes Absolute: 0.5 10*3/uL (ref 0.1–1.0)
Monocytes Relative: 15 %
Neutro Abs: 1.7 10*3/uL (ref 1.7–7.7)
Neutrophils Relative %: 49 %
Platelets: 229 10*3/uL (ref 150–400)
RBC: 3.2 MIL/uL — ABNORMAL LOW (ref 4.22–5.81)
RDW: 16 % — ABNORMAL HIGH (ref 11.5–15.5)
WBC: 3.5 10*3/uL — ABNORMAL LOW (ref 4.0–10.5)
nRBC: 0 % (ref 0.0–0.2)

## 2020-12-07 LAB — GLUCOSE, CAPILLARY
Glucose-Capillary: 131 mg/dL — ABNORMAL HIGH (ref 70–99)
Glucose-Capillary: 188 mg/dL — ABNORMAL HIGH (ref 70–99)
Glucose-Capillary: 204 mg/dL — ABNORMAL HIGH (ref 70–99)
Glucose-Capillary: 239 mg/dL — ABNORMAL HIGH (ref 70–99)

## 2020-12-07 LAB — MAGNESIUM: Magnesium: 1.8 mg/dL (ref 1.7–2.4)

## 2020-12-07 MED ORDER — LACTATED RINGERS IV BOLUS
1000.0000 mL | Freq: Once | INTRAVENOUS | Status: AC
  Administered 2020-12-07: 1000 mL via INTRAVENOUS

## 2020-12-07 MED ORDER — SACUBITRIL-VALSARTAN 49-51 MG PO TABS
1.0000 | ORAL_TABLET | Freq: Two times a day (BID) | ORAL | Status: DC
  Administered 2020-12-08 – 2020-12-11 (×8): 1 via ORAL
  Filled 2020-12-07 (×8): qty 1

## 2020-12-07 MED ORDER — TORSEMIDE 20 MG PO TABS
40.0000 mg | ORAL_TABLET | Freq: Two times a day (BID) | ORAL | Status: DC
  Administered 2020-12-08 – 2020-12-11 (×9): 40 mg via ORAL
  Filled 2020-12-07 (×10): qty 2

## 2020-12-07 MED ORDER — MAGNESIUM SULFATE 2 GM/50ML IV SOLN
2.0000 g | Freq: Once | INTRAVENOUS | Status: AC
  Administered 2020-12-07: 2 g via INTRAVENOUS
  Filled 2020-12-07: qty 50

## 2020-12-07 MED ORDER — ISOSORB DINITRATE-HYDRALAZINE 20-37.5 MG PO TABS
1.0000 | ORAL_TABLET | Freq: Three times a day (TID) | ORAL | Status: DC
  Administered 2020-12-08 – 2020-12-11 (×12): 1 via ORAL
  Filled 2020-12-07 (×13): qty 1

## 2020-12-07 NOTE — Progress Notes (Signed)
Subjective: Patient siting in chair comfortably.  Multiple BM;s over night.  Soft diet still. No nausea or vomiting.  Doing well.   Objective: Vital signs in last 24 hours: Temp:  [97.6 F (36.4 C)-99.1 F (37.3 C)] 98 F (36.7 C) (10/13 0800) Resp:  [9-15] 11 (10/13 0900) BP: (82-140)/(33-80) 132/75 (10/13 1100) SpO2:  [98 %-100 %] 99 % (10/13 0300) Weight change:  Last BM Date: 12/07/20  PE: Chronically ill-appearing, bedbound GENERAL: no distress, able to speak in full sentences  ABDOMEN: Soft AB, non distended, non tender AB EXTREMITIES: Bilateral lower extremities with pressure support boots  Lab Results: Results for orders placed or performed during the hospital encounter of 11/18/20 (from the past 48 hour(s))  Glucose, capillary     Status: Abnormal   Collection Time: 12/05/20  1:06 PM  Result Value Ref Range   Glucose-Capillary 181 (H) 70 - 99 mg/dL    Comment: Glucose reference range applies only to samples taken after fasting for at least 8 hours.  Glucose, capillary     Status: Abnormal   Collection Time: 12/05/20  4:35 PM  Result Value Ref Range   Glucose-Capillary 154 (H) 70 - 99 mg/dL    Comment: Glucose reference range applies only to samples taken after fasting for at least 8 hours.  Glucose, capillary     Status: Abnormal   Collection Time: 12/05/20 10:09 PM  Result Value Ref Range   Glucose-Capillary 166 (H) 70 - 99 mg/dL    Comment: Glucose reference range applies only to samples taken after fasting for at least 8 hours.  Glucose, capillary     Status: Abnormal   Collection Time: 12/06/20  7:54 AM  Result Value Ref Range   Glucose-Capillary 154 (H) 70 - 99 mg/dL    Comment: Glucose reference range applies only to samples taken after fasting for at least 8 hours.  Glucose, capillary     Status: Abnormal   Collection Time: 12/06/20  1:17 PM  Result Value Ref Range   Glucose-Capillary 196 (H) 70 - 99 mg/dL    Comment: Glucose reference range applies  only to samples taken after fasting for at least 8 hours.  MRSA Next Gen by PCR, Nasal     Status: None   Collection Time: 12/06/20  2:53 PM   Specimen: Nasal Mucosa; Nasal Swab  Result Value Ref Range   MRSA by PCR Next Gen NOT DETECTED NOT DETECTED    Comment: (NOTE) The GeneXpert MRSA Assay (FDA approved for NASAL specimens only), is one component of a comprehensive MRSA colonization surveillance program. It is not intended to diagnose MRSA infection nor to guide or monitor treatment for MRSA infections. Test performance is not FDA approved in patients less than 46 years old. Performed at [facility], [place] [address], [place], [place] [address]   Glucose, capillary     Status: Abnormal   Collection Time: 12/06/20  4:33 PM  Result Value Ref Range   Glucose-Capillary 161 (H) 70 - 99 mg/dL    Comment: Glucose reference range applies only to samples taken after fasting for at least 8 hours.   Comment 1 Notify RN    Comment 2 Document in Chart   Glucose, capillary     Status: Abnormal   Collection Time: 12/06/20  9:29 PM  Result Value Ref Range   Glucose-Capillary 142 (H) 70 - 99 mg/dL    Comment: Glucose reference range applies only to samples taken after fasting for at least  8 hours.  Basic metabolic panel     Status: Abnormal   Collection Time: 12/07/20  2:54 AM  Result Value Ref Range   Sodium 137 135 - 145 mmol/L   Potassium 4.8 3.5 - 5.1 mmol/L   Chloride 111 98 - 111 mmol/L   CO2 19 (L) 22 - 32 mmol/L   Glucose, Bld 144 (H) 70 - 99 mg/dL    Comment: Glucose reference range applies only to samples taken after fasting for at least 8 hours.   BUN 21 (H) 6 - 20 mg/dL   Creatinine, Ser 1.51 (H) 0.61 - 1.24 mg/dL   Calcium 8.2 (L) 8.9 - 10.3 mg/dL   GFR, Estimated 53 (L) >60 mL/min    Comment: (NOTE) Calculated using the CKD-EPI Creatinine Equation (2021)    Anion gap 7 5 - 15    Comment: Performed at [facility], [place] [address]  [place], [place] [address]  CBC with Differential/Platelet     Status: Abnormal   Collection Time: 12/07/20  2:54 AM  Result Value Ref Range   WBC 3.5 (L) 4.0 - 10.5 K/uL   RBC 3.20 (L) 4.22 - 5.81 MIL/uL   Hemoglobin 8.1 (L) 13.0 - 17.0 g/dL   HCT 26.3 (L) 39.0 - 52.0 %   MCV 82.2 80.0 - 100.0 fL   MCH 25.3 (L) 26.0 - 34.0 pg   MCHC 30.8 30.0 - 36.0 g/dL   RDW 16.0 (H) 11.5 - 15.5 %   Platelets 229 150 - 400 K/uL   nRBC 0.0 0.0 - 0.2 %   Neutrophils Relative % 49 %   Neutro Abs 1.7 1.7 - 7.7 K/uL   Lymphocytes Relative 32 %   Lymphs Abs 1.1 0.7 - 4.0 K/uL   Monocytes Relative 15 %   Monocytes Absolute 0.5 0.1 - 1.0 K/uL   Eosinophils Relative 3 %   Eosinophils Absolute 0.1 0.0 - 0.5 K/uL   Basophils Relative 0 %   Basophils Absolute 0.0 0.0 - 0.1 K/uL   Immature Granulocytes 1 %   Abs Immature Granulocytes 0.02 0.00 - 0.07 K/uL    Comment: Performed at [facility], [place] [address], [place], [place] [address]  Magnesium     Status: None   Collection Time: 12/07/20  2:54 AM  Result Value Ref Range   Magnesium 1.8 1.7 - 2.4 mg/dL    Comment: Performed at [facility], [place] [address] [place], [place] [address]  Glucose, capillary     Status: Abnormal   Collection Time: 12/07/20  7:41 AM  Result Value Ref Range   Glucose-Capillary 131 (H) 70 - 99 mg/dL    Comment: Glucose reference range applies only to samples taken after fasting for at least 8 hours.    Studies/Results: DG Abd Portable 1V  Result Date: 12/07/2020 CLINICAL DATA:  Ogilvie's syndrome EXAM: PORTABLE ABDOMEN - 1 VIEW COMPARISON:  12/06/2020 FINDINGS: Diminished exam detail due to body habitus. Continued interval improvement in the appearance of gaseous distension of the transverse colon. No radio-opaque calculi or other significant radiographic abnormality are seen. IMPRESSION: Continued interval improvement in gaseous distension of the transverse colon. Electronically Signed   By:  Kerby Moors M.D.   On: 12/07/2020 07:57   DG Abd Portable 1V  Result Date: 12/06/2020 CLINICAL DATA:  Ogilvie syndrome EXAM: PORTABLE ABDOMEN - 1 VIEW COMPARISON:  12/04/2020 FINDINGS: Two supine frontal views of the abdomen and pelvis demonstrate persistent gaseous distension of the transverse  colon measuring up to 11.6 cm in diameter, previously having measured 15.5 cm. No abdominal masses or abnormal calcifications. No acute bony abnormalities. IMPRESSION: 1. Persistent but decreased gaseous distension of the transverse colon. Electronically Signed   By: Randa Ngo M.D.   On: 12/06/2020 18:49    Medications: I have reviewed the patient's current medications.  Assessment: Adynamic ileus Potassium 4.6, magnesium 1.9 Microcytic anemia, hemoglobin 9.3, MCV 79.7  Plan: s/p colonic depression 12/03/2020 with Dr. Therisa Doyne KUB 12/04/20 showed slight improvement in ileus neostigmine administered 12/06/2020 KUB 12/07/20 showed continued interval improvement Continue Metamucil twice a day and MiraLAX twice a day. Continue to maintain magnesium above 2 and potassium at 4-4.5.  Appreciate Gen surgery input for patient with chronic adynamic ileus.  Continue soft diet as tolerated.    Vladimir Crofts, PA-C 12/07/2020, 11:27 AM

## 2020-12-07 NOTE — Hospital Course (Addendum)
[name] is a 60 yo male with PMH obesity, CHF, HTN, CVA (right sided residual weakness), DMII, neuropathy, CKD3a, polysubstance use, chronic pain, depression, HLD who initially presented to the hospital with abdominal pain. Workup showed significant gastric distention of the colon.  GI and general surgery were consulted on admission. He has a history of having undergone bowel decompression in 2021 as well. He underwent first decompression on 11/19/2020 followed by NG tube placement.  Due to minimal improvement, he underwent repeat decompression on 12/03/2020. He continued to have persistent abdominal distention.  He underwent trial of neostigmine on 12/06/2020 resulting in multiple bowel movements and improvement in distention and bowel gas pattern on KUB. With ongoing bowel regimen he continued to have regular bowel movements. He tolerated a diet well with no N/V.   Overall his abdominal distention improved and electrolytes remained stable. His potassium dose was lowered slightly at discharge due to mildly elevated potassium, however he will need intermittent monitoring of electrolytes due to high risk of recurrent pseudoobstruction due to his poor mobility status.

## 2020-12-07 NOTE — Progress Notes (Addendum)
4 Days Post-Op   Subjective/Chief Complaint: Pt doing well this AM Mult BMs overnight KUB with normal bowel gas pattern and no distended colon  Objective: Vital signs in last 24 hours: Temp:  [97.6 F (36.4 C)-99.1 F (37.3 C)] 99.1 F (37.3 C) (10/13 0300) Resp:  [10-15] 11 (10/13 0700) BP: (82-140)/(44-80) 108/55 (10/13 0700) SpO2:  [98 %-100 %] 99 % (10/13 0300) Last BM Date: 12/06/20  Intake/Output from previous day: 10/12 0701 - 10/13 0700 In: 1753.7 [P.O.:600; I.V.:1153.7] Out: 2400 [Urine:1400; Stool:1000] Intake/Output this shift: No intake/output data recorded.  PE:  Constitutional: No acute distress, conversant, appears states age. Eyes: Anicteric sclerae, moist conjunctiva, no lid lag Lungs: Clear to auscultation bilaterally, normal respiratory effort CV: regular rate and rhythm, no murmurs, no peripheral edema, pedal pulses 2+ GI: Soft, no masses or hepatosplenomegaly, non-tender to palpation, non distended, no rebound/guarding Skin: No rashes, palpation reveals normal turgor Psychiatric: appropriate judgment and insight, oriented to person, place, and time   Lab Results:  Recent Labs    12/05/20 0832 12/07/20 0254  WBC 3.3* 3.5*  HGB 9.3* 8.1*  HCT 29.9* 26.3*  PLT 283 229   BMET Recent Labs    12/05/20 0832 12/07/20 0254  NA 132* 137  K 4.6 4.8  CL 105 111  CO2 22 19*  GLUCOSE 176* 144*  BUN 17 21*  CREATININE 1.42* 1.51*  CALCIUM 7.8* 8.2*   PT/INR No results for input(s): LABPROT, INR in the last 72 hours. ABG No results for input(s): PHART, HCO3 in the last 72 hours.  Invalid input(s): PCO2, PO2  Studies/Results: DG Abd Portable 1V  Result Date: 12/06/2020 CLINICAL DATA:  Ogilvie syndrome EXAM: PORTABLE ABDOMEN - 1 VIEW COMPARISON:  12/04/2020 FINDINGS: Two supine frontal views of the abdomen and pelvis demonstrate persistent gaseous distension of the transverse colon measuring up to 11.6 cm in diameter, previously having  measured 15.5 cm. No abdominal masses or abnormal calcifications. No acute bony abnormalities. IMPRESSION: 1. Persistent but decreased gaseous distension of the transverse colon. Electronically Signed   By: Randa Ngo M.D.   On: 12/06/2020 18:49    Anti-infectives: Anti-infectives (From admission, onward)    None       Assessment/Plan: Colonic Ogilvie's  - s/p c-scope decompression x2 - neostigmine administered.  Appears that he's had mult BMs and soft abd.  Hopefully can avoid surgery.   FEN - soft diet VTE - lovenox ID - none currently needed   CHF DM HTN H/O CVA CKD Polysubstance abuse   LOS: 18 days    Ralene Ok 12/07/2020

## 2020-12-07 NOTE — Assessment & Plan Note (Addendum)
-   No significant bradycardia or hypotension during neostigmine administration on 12/06/2020.  Morning of 12/07/2020, blood pressure significantly low, 80s over 40s.  1 L LR bolus given and blood pressure did respond and improve although downtrending some after bolus - held morning meds and BP did slowly recover during the rest of the day - as of 10/14, BP more stable; okay to resume home meds and transfer out of SDU

## 2020-12-07 NOTE — Progress Notes (Signed)
Progress Note    CALAN DOREN   [id number]  DOB: Mar 28, 1960  DOA: 11/18/2020     18 Date of Service: 12/07/2020   [name] is a 60 yo male with PMH obesity, CHF, HTN, CVA (right sided residual weakness), DMII, neuropathy, CKD3a, polysubstance use, chronic pain, depression, HLD who initially presented to the hospital with abdominal pain. Workup showed significant gastric distention of the colon.  GI and general surgery were consulted on admission. He has a history of having undergone bowel decompression in 2021 as well. He underwent first decompression on 11/19/2020 followed by NG tube placement.  Due to minimal improvement, he underwent repeat decompression on 12/03/2020. He continued to have persistent abdominal distention.  He underwent trial of neostigmine on 12/06/2020 resulting in multiple bowel movements and improvement in distention and bowel gas pattern on KUB.  Subjective:  Patient was administered neostigmine late yesterday afternoon with good response and several bowel movements.  This morning he feels weak.  Blood pressure was also noted to be significantly low and he was started on fluid bolus.  Objective Vitals:   12/07/20 1000 12/07/20 1100 12/07/20 1200 12/07/20 1300  BP: 129/78 132/75 135/69 109/63  Pulse:      Resp:   10 15  Temp:   97.8 F (36.6 C)   TempSrc:   Oral   SpO2:      Weight:      Height:       127.8 kg  Vital signs were reviewed and unremarkable.   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  Good response with several bowel movements and improvement in abdominal distention - GI and surgery following - continue laxatives as ordered; rectal tube in place -Surgery continuing to follow in case still in need of intervention  Hypotension - No significant  bradycardia or hypotension during neostigmine administration on 12/06/2020.  This morning, 12/07/2020, blood pressure significantly low, 80s over 40s.  1 L LR bolus given and blood pressure did respond and improve although downtrending some after bolus - Continue holding antihypertensives and diuretics - Keep in stepdown unit for now in case of refractory hypotension.  Do not think this is lingering effect of neostigmine nor occult infection.  Also appears to be no blood loss.  May be due to significant defecation since yesterday as well as some vagal response associated with this  Chronic diastolic CHF (congestive heart failure) ([place]) - Last echo 08/22/2020: EF 65 to 31%, grade 2 diastolic dysfunction -Continue aspirin, Lipitor, BiDil, Entresto, Jardiance - d/c coreg; discussed with cardiology  - no s/s exacerbation at this time   Hypokalemia - goal K>4, Mg>2  Pressure injury of skin - Residual right-sided weakness from history of stroke  BPH (benign prostatic hyperplasia) - Continue Flomax  Acute renal failure superimposed on stage 3a chronic kidney disease ([place]) - patient has history of CKD3a. Baseline creat ~ 1.6 - 1.7, eGFR 49-51 - patient presents with increase in creat >0.3 mg/dL above baseline, creat increase >1.5x baseline presumed to have occurred within past 7 days PTA - creat peaked at 2.11 after admission - continues to improve - BMP daily   Depression - Continue Zoloft  History of cerebrovascular accident (CVA) with residual deficit - Residual right-sided weakness -Continue aspirin and statin  Microcytic anemia - hx IDA; low stores on 08/23/20 check - baseline Hgb 9-10 g/dL -  also some contribution from CKD as well -Continue oral iron - currently at baseline   Diabetes mellitus type 2, uncontrolled - Last A1c 9.1% on 06/25/2020 - Continue SSI and CBG monitoring  Essential hypertension - morning meds held on 10/13 due to severe hypoTN - will resume back as  able  Abdominal pain, generalized-resolved as of 12/05/2020 - see Ogilvie      DVT prophylaxis: SCDs Start: 11/18/20 2040     Code Status: Full Code Family Communication:   Disposition Plan: Status is: Inpatient  Remains inpatient appropriate because: hypotension, need for IVF, ongoing treatment of Ogilvie  Exam Physical Exam Constitutional:      Appearance: He is not ill-appearing.     Comments: Lethargic but arouses easily to voice.  Follows commands  HENT:     Head: Normocephalic and atraumatic.     Mouth/Throat:     Mouth: Mucous membranes are moist.  Eyes:     Extraocular Movements: Extraocular movements intact.  Cardiovascular:     Rate and Rhythm: Normal rate and regular rhythm.     Heart sounds: Normal heart sounds.  Pulmonary:     Breath sounds: Normal breath sounds.  Abdominal:     Comments: Much less distended and more soft.  Bowel sounds more active.  Nontender to palpation.  Musculoskeletal:        General: Normal range of motion.     Cervical back: Neck supple.  Skin:    General: Skin is warm and dry.  Neurological:     Mental Status: Mental status is at baseline.     Comments: 4/5 strength in right upper and lower extremity  Psychiatric:        Mood and Affect: Mood normal.        Behavior: Behavior normal.     Labs / Other Information My review of labs, imaging, notes and other tests is significant for Improved bowel gas pattern on xray    Body mass index is 42.84 kg/m. Nutrition Problem: Inadequate oral intake Etiology: acute illness (persistent adynamic ileus)  Pressure Injury 11/18/20 Heel Right;Posterior Deep Tissue Pressure Injury - Purple or maroon localized area of discolored intact skin or blood-filled blister due to damage of underlying soft tissue from pressure and/or shear. (Active)  11/18/20 2300  Location: Heel  Location Orientation: Right;Posterior  Staging: Deep Tissue Pressure Injury - Purple or maroon localized area of  discolored intact skin or blood-filled blister due to damage of underlying soft tissue from pressure and/or shear.  Wound Description (Comments):   Present on Admission: Yes     Pressure Injury 11/25/20 Heel Left Deep Tissue Pressure Injury - Purple or maroon localized area of discolored intact skin or blood-filled blister due to damage of underlying soft tissue from pressure and/or shear. (Active)  11/25/20 2000  Location: Heel  Location Orientation: Left  Staging: Deep Tissue Pressure Injury - Purple or maroon localized area of discolored intact skin or blood-filled blister due to damage of underlying soft tissue from pressure and/or shear.  Wound Description (Comments):   Present on Admission:      Time spent: Greater than 50% of the 35 minute visit was spent in counseling/coordination of care for the patient as laid out in the A&P.  Dwyane Dee, MD [facility] 12/07/2020, 1:43 PM

## 2020-12-07 NOTE — Progress Notes (Signed)
[name] 12:15 PM  Subjective: Patient doing much better and the medicine worked although I am not sure how often we could use it any ate all of his breakfast and is passing gas has no new complaints  Objective: Vital signs stable afebrile no acute distress abdomen is soft nontender definitely less distended x-ray improved labs stable  Assessment: Chronic colonic ileus  Plan: Please let us know if we can be of any further assistance with this hospital stay and we will see how long this lasts and see if he eventually will need surgical options  [facility]  office [phone] After 5PM or if no answer call [phone]

## 2020-12-08 ENCOUNTER — Inpatient Hospital Stay (HOSPITAL_COMMUNITY): Payer: Medicare HMO

## 2020-12-08 DIAGNOSIS — K5981 Ogilvie syndrome: Secondary | ICD-10-CM | POA: Diagnosis not present

## 2020-12-08 LAB — GLUCOSE, CAPILLARY
Glucose-Capillary: 175 mg/dL — ABNORMAL HIGH (ref 70–99)
Glucose-Capillary: 202 mg/dL — ABNORMAL HIGH (ref 70–99)
Glucose-Capillary: 200 mg/dL — ABNORMAL HIGH (ref 70–99)
Glucose-Capillary: 233 mg/dL — ABNORMAL HIGH (ref 70–99)

## 2020-12-08 LAB — CBC WITH DIFFERENTIAL/PLATELET
Abs Immature Granulocytes: 0.06 10*3/uL (ref 0.00–0.07)
Basophils Absolute: 0 10*3/uL (ref 0.0–0.1)
Basophils Relative: 1 %
Eosinophils Absolute: 0.1 10*3/uL (ref 0.0–0.5)
Eosinophils Relative: 4 %
HCT: 27.8 % — ABNORMAL LOW (ref 39.0–52.0)
Hemoglobin: 8.7 g/dL — ABNORMAL LOW (ref 13.0–17.0)
Immature Granulocytes: 2 %
Lymphocytes Relative: 35 %
Lymphs Abs: 1.3 10*3/uL (ref 0.7–4.0)
MCH: 25.1 pg — ABNORMAL LOW (ref 26.0–34.0)
MCHC: 31.3 g/dL (ref 30.0–36.0)
MCV: 80.3 fL (ref 80.0–100.0)
Monocytes Absolute: 0.7 10*3/uL (ref 0.1–1.0)
Monocytes Relative: 20 %
Neutro Abs: 1.4 10*3/uL — ABNORMAL LOW (ref 1.7–7.7)
Neutrophils Relative %: 38 %
Platelets: 304 10*3/uL (ref 150–400)
RBC: 3.46 MIL/uL — ABNORMAL LOW (ref 4.22–5.81)
RDW: 16.2 % — ABNORMAL HIGH (ref 11.5–15.5)
WBC: 3.6 10*3/uL — ABNORMAL LOW (ref 4.0–10.5)
nRBC: 0.5 % — ABNORMAL HIGH (ref 0.0–0.2)

## 2020-12-08 LAB — MAGNESIUM: Magnesium: 2 mg/dL (ref 1.7–2.4)

## 2020-12-08 LAB — BASIC METABOLIC PANEL
Anion gap: 4 — ABNORMAL LOW (ref 5–15)
BUN: 24 mg/dL — ABNORMAL HIGH (ref 6–20)
CO2: 21 mmol/L — ABNORMAL LOW (ref 22–32)
Calcium: 8.1 mg/dL — ABNORMAL LOW (ref 8.9–10.3)
Chloride: 106 mmol/L (ref 98–111)
Creatinine, Ser: 1.08 mg/dL (ref 0.61–1.24)
GFR, Estimated: >60 mL/min (ref >60)
Glucose, Bld: 215 mg/dL — ABNORMAL HIGH (ref 70–99)
Potassium: 5 mmol/L (ref 3.5–5.1)
Sodium: 131 mmol/L — ABNORMAL LOW (ref 135–145)

## 2020-12-08 NOTE — Progress Notes (Signed)
5 Days Post-Op   Subjective/Chief Complaint: Pt with no abd pain Tol Po  Having BMs    Objective: Vital signs in last 24 hours: Temp:  [97.4 F (36.3 C)-98.1 F (36.7 C)] 98.1 F (36.7 C) (10/14 0823) Resp:  [10-18] 11 (10/14 1000) BP: (109-165)/(63-104) 152/85 (10/14 1000) SpO2:  [100 %] 100 % (10/13 2004) Last BM Date: 12/08/20  Intake/Output from previous day: 10/13 0701 - 10/14 0700 In: 1091.2 [I.V.:969.6; IV Piggyback:121.6] Out: 3175 [Urine:3075; Stool:100] Intake/Output this shift: Total I/O In: -  Out: 750 [Urine:750]  PE:  Constitutional: No acute distress, conversant, appears states age. Eyes: Anicteric sclerae, moist conjunctiva, no lid lag Lungs: Clear to auscultation bilaterally, normal respiratory effort CV: regular rate and rhythm, no murmurs, no peripheral edema, pedal pulses 2+ GI: Soft, no masses or hepatosplenomegaly, non-tender to palpation, min distended Skin: No rashes, palpation reveals normal turgor Psychiatric: appropriate judgment and insight, oriented to person, place, and time   Lab Results:  Recent Labs    12/07/20 0254 12/08/20 0743  WBC 3.5* 3.6*  HGB 8.1* 8.7*  HCT 26.3* 27.8*  PLT 229 304   BMET Recent Labs    12/07/20 0254 12/08/20 0743  NA 137 131*  K 4.8 5.0  CL 111 106  CO2 19* 21*  GLUCOSE 144* 215*  BUN 21* 24*  CREATININE 1.51* 1.08  CALCIUM 8.2* 8.1*  Studies/Results: DG Abd Portable 1V  Result Date: 12/08/2020 CLINICAL DATA:  Abdominal distension. EXAM: PORTABLE ABDOMEN - 1 VIEW COMPARISON:  12/07/2020 FINDINGS: There is increased, mild-to-moderate gaseous distension of the stomach. There is mild gaseous distension of the transverse colon which has further mildly improved. Gas is present throughout other loops of nondilated small and large bowel throughout the abdomen. The visualized lung bases are grossly clear. No acute osseous abnormality is seen. IMPRESSION: Increased gaseous distension of the stomach.  Mildly decreased gaseous distension of the transverse colon. Electronically Signed   By: Logan Bores M.D.   On: 12/08/2020 10:38   DG Abd Portable 1V  Result Date: 12/07/2020 CLINICAL DATA:  Ogilvie's syndrome EXAM: PORTABLE ABDOMEN - 1 VIEW COMPARISON:  12/06/2020 FINDINGS: Diminished exam detail due to body habitus. Continued interval improvement in the appearance of gaseous distension of the transverse colon. No radio-opaque calculi or other significant radiographic abnormality are seen. IMPRESSION: Continued interval improvement in gaseous distension of the transverse colon. Electronically Signed   By: Kerby Moors M.D.   On: 12/07/2020 07:57   DG Abd Portable 1V  Result Date: 12/06/2020 CLINICAL DATA:  Ogilvie syndrome EXAM: PORTABLE ABDOMEN - 1 VIEW COMPARISON:  12/04/2020 FINDINGS: Two supine frontal views of the abdomen and pelvis demonstrate persistent gaseous distension of the transverse colon measuring up to 11.6 cm in diameter, previously having measured 15.5 cm. No abdominal masses or abnormal calcifications. No acute bony abnormalities. IMPRESSION: 1. Persistent but decreased gaseous distension of the transverse colon. Electronically Signed   By: Randa Ngo M.D.   On: 12/06/2020 18:49     Assessment/Plan: Colonic Ogilvie's  - Pt appears to have improved after neostigmine.  Con't to have bowel fxn and tol PO KUB with significantly less distended colon.   I don't think the patient will need any surgery. Please call back if needed   FEN - soft diet VTE - lovenox ID - none currently needed   CHF DM HTN H/O CVA CKD Polysubstance abuse   LOS: 19 days    Ralene Ok 12/08/2020

## 2020-12-08 NOTE — Progress Notes (Signed)
Progress Note    [name]   [id number]  DOB: 02/25/1961  DOA: 11/18/2020     19 Date of Service: 12/08/2020   Clinical Course [name] is a 60 yo male with PMH obesity, CHF, HTN, CVA (right sided residual weakness), DMII, neuropathy, CKD3a, polysubstance use, chronic pain, depression, HLD who initially presented to the hospital with abdominal pain. Workup showed significant gastric distention of the colon.  GI and general surgery were consulted on admission. He has a history of having undergone bowel decompression in 2021 as well. He underwent first decompression on 11/19/2020 followed by NG tube placement.  Due to minimal improvement, he underwent repeat decompression on 12/03/2020. He continued to have persistent abdominal distention.  He underwent trial of neostigmine on 12/06/2020 resulting in multiple bowel movements and improvement in distention and bowel gas pattern on KUB.  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  Good response with several bowel movements and improvement in abdominal distention - GI and surgery following - continue laxatives as ordered; rectal tube in place -Surgery continuing to follow in case still in need of intervention - need to continue encouraging working with PT as much as able to  - currently he appears to be tolerating a diet and having stool output despite ongoing distension (for now, continue monitoring with serial exams another 1-2 days for stability)  Hypotension - No significant bradycardia or hypotension during neostigmine administration on 12/06/2020.  Morning of 12/07/2020, blood pressure significantly low, 80s over 40s.  1 L LR bolus given and blood pressure did respond and improve although downtrending some after bolus - held morning meds and BP  did slowly recover during the rest of the day - as of 10/14, BP more stable; okay to resume home meds and transfer out of SDU  Chronic diastolic CHF (congestive heart failure) ([place]) - Last echo 08/22/2020: EF 65 to 95%, grade 2 diastolic dysfunction -Continue aspirin, Lipitor, BiDil, Entresto, Jardiance - d/c coreg; discussed with cardiology  - no s/s exacerbation at this time   Hypokalemia - goal K>4, Mg>2  Pressure injury of skin - Residual right-sided weakness from history of stroke  BPH (benign prostatic hyperplasia) - Continue Flomax  Acute renal failure superimposed on stage 3a chronic kidney disease ([place]) - patient has history of CKD3a. Baseline creat ~ 1.6 - 1.7, eGFR 49-51 - patient presents with increase in creat >0.3 mg/dL above baseline, creat increase >1.5x baseline presumed to have occurred within past 7 days PTA - creat peaked at 2.11 after admission - continues to improve - BMP daily   Depression - Continue Zoloft  History of cerebrovascular accident (CVA) with residual deficit - Residual right-sided weakness -Continue aspirin and statin  Microcytic anemia - hx IDA; low stores on 08/23/20 check - baseline Hgb 9-10 g/dL - also some contribution from CKD as well -Continue oral iron - currently at baseline   Diabetes mellitus type 2, uncontrolled - Last A1c 9.1% on 06/25/2020 - Continue SSI and CBG monitoring  Essential hypertension - morning meds held on 10/13 due to severe hypoTN; now resolved - resumed on 10/14  Abdominal pain, generalized-resolved as of 12/05/2020 - see Ogilvie      Subjective:  No events overnight.  Patient still tolerating food fairly well and having bowel movements.  Abdomen was a little more distended on my exam this morning.  Blood pressure has also improved since yesterday.  Okay to transfer out of stepdown.  Objective Vitals:   12/08/20 0913 12/08/20 1000 12/08/20 1100 12/08/20 1200  BP: 137/73 (!) 152/85 (!) 143/80 (!)  147/73  Pulse:      Resp:  11 11 10   Temp:      TempSrc:      SpO2:      Weight:      Height:       127.8 kg  Vital signs were reviewed and unremarkable.   Exam Physical Exam Constitutional:      General: He is not in acute distress.    Appearance: Normal appearance.  HENT:     Head: Normocephalic and atraumatic.     Mouth/Throat:     Mouth: Mucous membranes are moist.  Eyes:     Extraocular Movements: Extraocular movements intact.  Cardiovascular:     Rate and Rhythm: Normal rate and regular rhythm.     Heart sounds: Normal heart sounds.  Pulmonary:     Effort: Pulmonary effort is normal.     Breath sounds: Normal breath sounds.  Abdominal:     Palpations: Abdomen is soft.     Comments: Slightly more distended than yesterday with still hypoactive bowel sounds but no tenderness, rebound, guarding  Musculoskeletal:        General: Normal range of motion.     Cervical back: Normal range of motion and neck supple.  Skin:    General: Skin is warm and dry.  Neurological:     General: No focal deficit present.     Mental Status: He is alert.  Psychiatric:        Mood and Affect: Mood normal.        Behavior: Behavior normal.     Labs / Other Information My review of labs, imaging, notes and other tests is significant for KUB today shows increased gaseous distension of stomach     Disposition Plan: Status is: Inpatient  Remains inpatient appropriate because: ongoing serial abd exams and imaging needed  Time spent: Greater than 50% of the 35 minute visit was spent in counseling/coordination of care for the patient as laid out in the A&P.  Dwyane Dee, MD [facility] 12/08/2020, 12:48 PM

## 2020-12-09 DIAGNOSIS — K5981 Ogilvie syndrome: Secondary | ICD-10-CM | POA: Diagnosis not present

## 2020-12-09 LAB — CBC WITH DIFFERENTIAL/PLATELET
Abs Immature Granulocytes: 0.06 10*3/uL (ref 0.00–0.07)
Basophils Absolute: 0 10*3/uL (ref 0.0–0.1)
Basophils Relative: 0 %
Eosinophils Absolute: 0.2 10*3/uL (ref 0.0–0.5)
Eosinophils Relative: 4 %
HCT: 26.2 % — ABNORMAL LOW (ref 39.0–52.0)
Hemoglobin: 8.1 g/dL — ABNORMAL LOW (ref 13.0–17.0)
Immature Granulocytes: 1 %
Lymphocytes Relative: 33 %
Lymphs Abs: 1.5 10*3/uL (ref 0.7–4.0)
MCH: 24.8 pg — ABNORMAL LOW (ref 26.0–34.0)
MCHC: 30.9 g/dL (ref 30.0–36.0)
MCV: 80.4 fL (ref 80.0–100.0)
Monocytes Absolute: 0.9 10*3/uL (ref 0.1–1.0)
Monocytes Relative: 19 %
Neutro Abs: 1.9 10*3/uL (ref 1.7–7.7)
Neutrophils Relative %: 43 %
Platelets: 299 10*3/uL (ref 150–400)
RBC: 3.26 MIL/uL — ABNORMAL LOW (ref 4.22–5.81)
RDW: 16.1 % — ABNORMAL HIGH (ref 11.5–15.5)
WBC: 4.5 10*3/uL (ref 4.0–10.5)
nRBC: 0 % (ref 0.0–0.2)

## 2020-12-09 LAB — BASIC METABOLIC PANEL
Anion gap: 5 (ref 5–15)
BUN: 26 mg/dL — ABNORMAL HIGH (ref 6–20)
CO2: 22 mmol/L (ref 22–32)
Calcium: 8.3 mg/dL — ABNORMAL LOW (ref 8.9–10.3)
Chloride: 104 mmol/L (ref 98–111)
Creatinine, Ser: 1.18 mg/dL (ref 0.61–1.24)
GFR, Estimated: >60 mL/min (ref >60)
Glucose, Bld: 212 mg/dL — ABNORMAL HIGH (ref 70–99)
Potassium: 5.5 mmol/L — ABNORMAL HIGH (ref 3.5–5.1)
Sodium: 131 mmol/L — ABNORMAL LOW (ref 135–145)

## 2020-12-09 LAB — MAGNESIUM: Magnesium: 1.9 mg/dL (ref 1.7–2.4)

## 2020-12-09 LAB — GLUCOSE, CAPILLARY
Glucose-Capillary: 196 mg/dL — ABNORMAL HIGH (ref 70–99)
Glucose-Capillary: 225 mg/dL — ABNORMAL HIGH (ref 70–99)
Glucose-Capillary: 166 mg/dL — ABNORMAL HIGH (ref 70–99)
Glucose-Capillary: 180 mg/dL — ABNORMAL HIGH (ref 70–99)

## 2020-12-09 NOTE — Progress Notes (Signed)
Progress Note    ARDON FRANKLIN   [id number]  DOB: 07-13-1960  DOA: 11/18/2020     20 Date of Service: 12/09/2020   Clinical Course Mr. Navarez is a 60 yo male with PMH obesity, CHF, HTN, CVA (right sided residual weakness), DMII, neuropathy, CKD3a, polysubstance use, chronic pain, depression, HLD who initially presented to the hospital with abdominal pain. Workup showed significant gastric distention of the colon.  GI and general surgery were consulted on admission. He has a history of having undergone bowel decompression in 2021 as well. He underwent first decompression on 11/19/2020 followed by NG tube placement.  Due to minimal improvement, he underwent repeat decompression on 12/03/2020. He continued to have persistent abdominal distention.  He underwent trial of neostigmine on 12/06/2020 resulting in multiple bowel movements and improvement in distention and bowel gas pattern on KUB.  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  Good response with several bowel movements and improvement in abdominal distention - GI and surgery following - continue laxatives as ordered; rectal tube in place -Surgery continuing to follow in case still in need of intervention - need to continue encouraging working with PT as much as able to  - currently he appears to be tolerating a diet and having stool output despite ongoing distension (for now, continue monitoring with serial exams another 1-2 days for stability)  Hypotension-resolved as of 12/09/2020 - No significant bradycardia or hypotension during neostigmine administration on 12/06/2020.  Morning of 12/07/2020, blood pressure significantly low, 80s over 40s.  1 L LR bolus given and blood pressure did respond and improve although downtrending some after bolus -  held morning meds and BP did slowly recover during the rest of the day - as of 10/14, BP more stable; okay to resume home meds and transfer out of SDU  Chronic diastolic CHF (congestive heart failure) ([place]) - Last echo 08/22/2020: EF 65 to 06%, grade 2 diastolic dysfunction -Continue aspirin, Lipitor, BiDil, Entresto, Jardiance - d/c coreg; discussed with cardiology  - no s/s exacerbation at this time   Hypokalemia - goal K>4, Mg>2  Pressure injury of skin - Residual right-sided weakness from history of stroke  BPH (benign prostatic hyperplasia) - Continue Flomax  Acute renal failure superimposed on stage 3a chronic kidney disease ([place]) - patient has history of CKD3a. Baseline creat ~ 1.6 - 1.7, eGFR 49-51 - patient presents with increase in creat >0.3 mg/dL above baseline, creat increase >1.5x baseline presumed to have occurred within past 7 days PTA - creat peaked at 2.11 after admission - continues to improve - BMP daily   Depression - Continue Zoloft  History of cerebrovascular accident (CVA) with residual deficit - Residual right-sided weakness -Continue aspirin and statin  Microcytic anemia - hx IDA; low stores on 08/23/20 check - baseline Hgb 9-10 g/dL - also some contribution from CKD as well -Continue oral iron - currently at baseline   Diabetes mellitus type 2, uncontrolled - Last A1c 9.1% on 06/25/2020 - Continue SSI and CBG monitoring  Essential hypertension - morning meds held on 10/13 due to severe hypoTN; now resolved - resumed on 10/14  Abdominal pain, generalized-resolved as of 12/05/2020 - see Ogilvie      Subjective:  No events overnight. Denies N/V or abd pain. Noted to have stool in rectal tube this am.   Objective Vitals:  12/08/20 2154 12/09/20 0150 12/09/20 0405 12/09/20 1313  BP: 119/68 114/72 (!) 145/85 134/72  Pulse: 80 81 81 80  Resp: 14 14 14 16   Temp: 99.7 F (37.6 C) 99.3 F (37.4 C) (!) 97.2 F (36.2 C) 99.3 F (37.4 C)   TempSrc: Oral Oral Oral Oral  SpO2: 100% 100% 100% 100%  Weight:      Height:       119.5 kg  Vital signs were reviewed and unremarkable.   Exam Physical Exam Constitutional:      Appearance: Normal appearance.  HENT:     Head: Normocephalic and atraumatic.     Mouth/Throat:     Mouth: Mucous membranes are moist.  Eyes:     Extraocular Movements: Extraocular movements intact.  Cardiovascular:     Rate and Rhythm: Normal rate and regular rhythm.     Heart sounds: Normal heart sounds.  Pulmonary:     Effort: Pulmonary effort is normal.     Breath sounds: Normal breath sounds.  Abdominal:     General: Bowel sounds are normal. There is distension.     Palpations: Abdomen is soft.     Tenderness: There is no abdominal tenderness.  Musculoskeletal:        General: Normal range of motion.     Cervical back: Normal range of motion and neck supple.  Skin:    General: Skin is warm and dry.  Neurological:     Mental Status: He is alert. Mental status is at baseline.     Comments: 4/5 RUE/RLE strength at baseline   Psychiatric:        Mood and Affect: Mood normal.        Behavior: Behavior normal.     Labs / Other Information My review of labs, imaging, notes and other tests shows no new significant findings.    Disposition Plan: Status is: Inpatient  Remains inpatient appropriate because: serial abd exams    Time spent: Greater than 50% of the 35 minute visit was spent in counseling/coordination of care for the patient as laid out in the A&P.  Dwyane Dee, MD [facility] 12/09/2020, 3:20 PM

## 2020-12-10 ENCOUNTER — Inpatient Hospital Stay (HOSPITAL_COMMUNITY): Payer: Medicare HMO

## 2020-12-10 DIAGNOSIS — K5981 Ogilvie syndrome: Secondary | ICD-10-CM | POA: Diagnosis not present

## 2020-12-10 LAB — GLUCOSE, CAPILLARY
Glucose-Capillary: 191 mg/dL — ABNORMAL HIGH (ref 70–99)
Glucose-Capillary: 206 mg/dL — ABNORMAL HIGH (ref 70–99)
Glucose-Capillary: 230 mg/dL — ABNORMAL HIGH (ref 70–99)
Glucose-Capillary: 239 mg/dL — ABNORMAL HIGH (ref 70–99)

## 2020-12-10 NOTE — Progress Notes (Signed)
Patient continues to refuse labs. Encouraged patient to get OOB with lift, but he declined.

## 2020-12-10 NOTE — Progress Notes (Signed)
Progress Note    TRYSTIN TERHUNE   QZE:[id number]  DOB: 01-19-1961  DOA: 11/18/2020     21 Date of Service: 12/10/2020   Clinical Course Mr. Simkin is a 60 yo male with PMH obesity, CHF, HTN, CVA (right sided residual weakness), DMII, neuropathy, CKD3a, polysubstance use, chronic pain, depression, HLD who initially presented to the hospital with abdominal pain. Workup showed significant gastric distention of the colon.  GI and general surgery were consulted on admission. He has a history of having undergone bowel decompression in 2021 as well. He underwent first decompression on 11/19/2020 followed by NG tube placement.  Due to minimal improvement, he underwent repeat decompression on 12/03/2020. He continued to have persistent abdominal distention.  He underwent trial of neostigmine on 12/06/2020 resulting in multiple bowel movements and improvement in distention and bowel gas pattern on KUB.   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  Good response with several bowel movements and improvement in abdominal distention - GI and surgery following - continue laxatives as ordered; rectal tube in place -Surgery continuing to follow in case still in need of intervention - need to continue encouraging working with PT as much as able to  - currently he appears to be tolerating a diet and having stool output despite ongoing distension (for now, continue monitoring with serial exams another 1-2 days for stability) - xray looks improved a little more today, 10/16  Hypotension-resolved as of 12/09/2020 - No significant bradycardia or hypotension during neostigmine administration on 12/06/2020.  Morning of 12/07/2020, blood pressure significantly low, 80s over 40s.  1 L LR bolus given and blood pressure did respond  and improve although downtrending some after bolus - held morning meds and BP did slowly recover during the rest of the day - as of 10/14, BP more stable; okay to resume home meds and transfer out of SDU  Chronic diastolic CHF (congestive heart failure) ([place]) - Last echo 08/22/2020: EF 65 to 22%, grade 2 diastolic dysfunction -Continue aspirin, Lipitor, BiDil, Entresto, Jardiance - d/c coreg; discussed with cardiology  - no s/s exacerbation at this time   Hypokalemia - goal K>4, Mg>2  Pressure injury of skin - Residual right-sided weakness from history of stroke  BPH (benign prostatic hyperplasia) - Continue Flomax  Acute renal failure superimposed on stage 3a chronic kidney disease ([place]) - patient has history of CKD3a. Baseline creat ~ 1.6 - 1.7, eGFR 49-51 - patient presents with increase in creat >0.3 mg/dL above baseline, creat increase >1.5x baseline presumed to have occurred within past 7 days PTA - creat peaked at 2.11 after admission - continues to improve - BMP daily   Depression - Continue Zoloft  History of cerebrovascular accident (CVA) with residual deficit - Residual right-sided weakness -Continue aspirin and statin  Microcytic anemia - hx IDA; low stores on 08/23/20 check - baseline Hgb 9-10 g/dL - also some contribution from CKD as well -Continue oral iron - currently at baseline   Diabetes mellitus type 2, uncontrolled - Last A1c 9.1% on 06/25/2020 - Continue SSI and CBG monitoring  Essential hypertension - morning meds held on 10/13 due to severe hypoTN; now resolved - resumed on 10/14  Abdominal pain, generalized-resolved as of 12/05/2020 - see Ogilvie      Subjective:  No events overnight.  Still sleepy in bed this morning but awakens easily  to have conversation goes back to bed.  Denied any nausea or vomiting.  Still having ongoing stool output noted in the rectal tube.  Objective Vitals:   12/09/20 0405 12/09/20 1313 12/09/20 2038 12/10/20  0420  BP: (!) 145/85 134/72 129/67 137/81  Pulse: 81 80 85 85  Resp: _0 Temp: (!) 97.2 F (36.2 C) 99.3 F (37.4 C) 98.8 F (37.1 C) 99.3 F (37.4 C)  TempSrc: Oral Oral Oral Oral  SpO2: 100% 100% 100% 100%  Weight:      Height:       119.5 kg  Vital signs were reviewed and unremarkable.   Exam Physical Exam Constitutional:      Appearance: Normal appearance.  HENT:     Head: Normocephalic and atraumatic.     Mouth/Throat:     Mouth: Mucous membranes are moist.  Eyes:     Extraocular Movements: Extraocular movements intact.  Cardiovascular:     Rate and Rhythm: Normal rate and regular rhythm.  Pulmonary:     Effort: Pulmonary effort is normal.     Breath sounds: Normal breath sounds. No wheezing.  Abdominal:     General: There is distension.     Palpations: Abdomen is soft.     Tenderness: There is no abdominal tenderness.     Comments: Still decreased bowel sounds but they are overall present just soft  Musculoskeletal:        General: Normal range of motion.     Cervical back: Normal range of motion and neck supple.  Skin:    General: Skin is warm and dry.  Neurological:     Mental Status: He is alert.     Comments: 4/5 RUE/RLE strength  Psychiatric:        Mood and Affect: Mood normal.        Behavior: Behavior normal.     Labs / Other Information My review of labs, imaging, notes and other tests shows no new significant findings.    Disposition Plan: Status is: Inpatient  Remains inpatient appropriate because: serial abd exams and watching bowel function     Time spent: Greater than 50% of the 35 minute visit was spent in counseling/coordination of care for the patient as laid out in the A&P.  Dwyane Dee, MD [facility] 12/10/2020, 12:17 PM

## 2020-12-11 DIAGNOSIS — D508 Other iron deficiency anemias: Secondary | ICD-10-CM | POA: Diagnosis not present

## 2020-12-11 DIAGNOSIS — I10 Essential (primary) hypertension: Secondary | ICD-10-CM | POA: Diagnosis not present

## 2020-12-11 DIAGNOSIS — I5032 Chronic diastolic (congestive) heart failure: Secondary | ICD-10-CM | POA: Diagnosis not present

## 2020-12-11 DIAGNOSIS — S3991XA Unspecified injury of abdomen, initial encounter: Secondary | ICD-10-CM | POA: Diagnosis not present

## 2020-12-11 DIAGNOSIS — D509 Iron deficiency anemia, unspecified: Secondary | ICD-10-CM | POA: Diagnosis not present

## 2020-12-11 DIAGNOSIS — K5981 Ogilvie syndrome: Secondary | ICD-10-CM | POA: Diagnosis not present

## 2020-12-11 DIAGNOSIS — N1831 Chronic kidney disease, stage 3a: Secondary | ICD-10-CM | POA: Diagnosis not present

## 2020-12-11 DIAGNOSIS — Z743 Need for continuous supervision: Secondary | ICD-10-CM | POA: Diagnosis not present

## 2020-12-11 DIAGNOSIS — D649 Anemia, unspecified: Secondary | ICD-10-CM | POA: Diagnosis not present

## 2020-12-11 DIAGNOSIS — R1312 Dysphagia, oropharyngeal phase: Secondary | ICD-10-CM | POA: Diagnosis not present

## 2020-12-11 DIAGNOSIS — N179 Acute kidney failure, unspecified: Secondary | ICD-10-CM | POA: Diagnosis not present

## 2020-12-11 DIAGNOSIS — R2689 Other abnormalities of gait and mobility: Secondary | ICD-10-CM | POA: Diagnosis not present

## 2020-12-11 DIAGNOSIS — E119 Type 2 diabetes mellitus without complications: Secondary | ICD-10-CM | POA: Diagnosis not present

## 2020-12-11 DIAGNOSIS — R41841 Cognitive communication deficit: Secondary | ICD-10-CM | POA: Diagnosis not present

## 2020-12-11 DIAGNOSIS — M6281 Muscle weakness (generalized): Secondary | ICD-10-CM | POA: Diagnosis not present

## 2020-12-11 DIAGNOSIS — R69 Illness, unspecified: Secondary | ICD-10-CM | POA: Diagnosis not present

## 2020-12-11 DIAGNOSIS — E559 Vitamin D deficiency, unspecified: Secondary | ICD-10-CM | POA: Diagnosis not present

## 2020-12-11 DIAGNOSIS — R1084 Generalized abdominal pain: Secondary | ICD-10-CM | POA: Diagnosis not present

## 2020-12-11 DIAGNOSIS — R4182 Altered mental status, unspecified: Secondary | ICD-10-CM | POA: Diagnosis not present

## 2020-12-11 DIAGNOSIS — E876 Hypokalemia: Secondary | ICD-10-CM | POA: Diagnosis not present

## 2020-12-11 LAB — GLUCOSE, CAPILLARY
Glucose-Capillary: 166 mg/dL — ABNORMAL HIGH (ref 70–99)
Glucose-Capillary: 173 mg/dL — ABNORMAL HIGH (ref 70–99)
Glucose-Capillary: 161 mg/dL — ABNORMAL HIGH (ref 70–99)
Glucose-Capillary: 237 mg/dL — ABNORMAL HIGH (ref 70–99)

## 2020-12-11 LAB — BASIC METABOLIC PANEL
Anion gap: 6 (ref 5–15)
BUN: 33 mg/dL — ABNORMAL HIGH (ref 6–20)
CO2: 25 mmol/L (ref 22–32)
Calcium: 8.6 mg/dL — ABNORMAL LOW (ref 8.9–10.3)
Chloride: 100 mmol/L (ref 98–111)
Creatinine, Ser: 1.37 mg/dL — ABNORMAL HIGH (ref 0.61–1.24)
GFR, Estimated: 59 mL/min — ABNORMAL LOW (ref >60)
Glucose, Bld: 160 mg/dL — ABNORMAL HIGH (ref 70–99)
Potassium: 5.2 mmol/L — ABNORMAL HIGH (ref 3.5–5.1)
Sodium: 131 mmol/L — ABNORMAL LOW (ref 135–145)

## 2020-12-11 LAB — RESP PANEL BY RT-PCR (FLU A&B, COVID) ARPGX2
Influenza A by PCR: NEGATIVE
Influenza B by PCR: NEGATIVE
SARS Coronavirus 2 by RT PCR: NEGATIVE

## 2020-12-11 LAB — MAGNESIUM: Magnesium: 1.9 mg/dL (ref 1.7–2.4)

## 2020-12-11 MED ORDER — POTASSIUM CHLORIDE CRYS ER 10 MEQ PO TBCR: 20.0000 meq | EXTENDED_RELEASE_TABLET | Freq: Every day | ORAL | Status: DC

## 2020-12-11 MED ORDER — ENSURE ENLIVE PO LIQD: 237.0000 mL | Freq: Two times a day (BID) | ORAL | Status: DC

## 2020-12-11 MED ORDER — LOPERAMIDE HCL 2 MG PO CAPS: 2.0000 mg | ORAL_CAPSULE | Freq: Four times a day (QID) | ORAL | 0 refills | Status: DC | PRN

## 2020-12-11 NOTE — Progress Notes (Signed)
Report called to nurse Katy Fitch at [facility]. Awaiting [facility] for transport.

## 2020-12-11 NOTE — Discharge Summary (Signed)
Physician Discharge Summary   Patient name: [name]  Admit date:     11/18/2020  Discharge date: 12/11/2020  Discharge Physician: Dwyane Dee   PCP: [name], NP   Recommendations at discharge:  Check magnesium and potassium approximately weekly.  Per surgery, still high risk for needing intervention in the future if recurrence due to poor mobility status  Discharge Diagnoses Principal Problem:   Ogilvie syndrome Active Problems:   Hypokalemia   Chronic diastolic CHF (congestive heart failure) (HCC)   Essential hypertension   Diabetes mellitus type 2, uncontrolled   Microcytic anemia   History of cerebrovascular accident (CVA) with residual deficit   Depression   Acute renal failure superimposed on stage 3a chronic kidney disease (HCC)   BPH (benign prostatic hyperplasia)   Pressure injury of skin   Resolved Diagnoses Resolved Problems:   Hypotension   Abdominal pain, generalized   Hospital Course   [name] is a 60 yo male with PMH obesity, CHF, HTN, CVA (right sided residual weakness), DMII, neuropathy, CKD3a, polysubstance use, chronic pain, depression, HLD who initially presented to the hospital with abdominal pain. Workup showed significant gastric distention of the colon.  GI and general surgery were consulted on admission. He has a history of having undergone bowel decompression in 2021 as well. He underwent first decompression on 11/19/2020 followed by NG tube placement.  Due to minimal improvement, he underwent repeat decompression on 12/03/2020. He continued to have persistent abdominal distention.  He underwent trial of neostigmine on 12/06/2020 resulting in multiple bowel movements and improvement in distention and bowel gas pattern on KUB. With ongoing bowel regimen he continued to have regular bowel movements. He tolerated a diet well with no N/V.   Overall his abdominal distention improved and electrolytes remained stable. His potassium  dose was lowered slightly at discharge due to mildly elevated potassium, however he will need intermittent monitoring of electrolytes due to high risk of recurrent pseudoobstruction due to his poor mobility status.   * Ogilvie syndrome - Initially had persistent bowel dilation on serial imaging; electrolytes being replaced as indicated - s/p colonic decompression on 11/19/20 and 12/03/20 -Underwent neostigmine administration on 12/06/2020.  Good response with several bowel movements and improvement in abdominal distention - GI and surgery followed - continue laxatives -High risk of recurrent pseudoobstruction due to bedbound/wheelchair-bound status.  Continue checking electrolytes intermittently and continue bowel regimen and avoiding constipation. -May still require surgical intervention in the future if recurs  Hypotension-resolved as of 12/09/2020 - No significant bradycardia or hypotension during neostigmine administration on 12/06/2020.  Morning of 12/07/2020, blood pressure significantly low, 80s over 40s.  1 L LR bolus given and blood pressure did respond and improve although downtrending some after bolus - held morning meds and BP did slowly recover during the rest of the day - as of 10/14, BP more stable; okay to resume home meds and transfer out of SDU  Chronic diastolic CHF (congestive heart failure) ([place]) - Last echo 08/22/2020: EF 65 to 27%, grade 2 diastolic dysfunction -Continue aspirin, Lipitor, BiDil, Entresto, Jardiance - d/c coreg; discussed with cardiology  - no s/s exacerbation at this time   Hypokalemia - goal K>4, Mg>2  Pressure injury of skin - Residual right-sided weakness from history of stroke  BPH (benign prostatic hyperplasia) - Continue Flomax  Acute renal failure superimposed on stage 3a chronic kidney disease ([place]) - patient has history of CKD3a. Baseline creat ~ 1.6 - 1.7, eGFR 49-51 - patient presents  with increase in creat >0.3 mg/dL above baseline,  creat increase >1.5x baseline presumed to have occurred within past 7 days PTA - creat peaked at 2.11 after admission - continues to improve; creatinine 1.37 at time of discharge  Depression - Continue Zoloft  History of cerebrovascular accident (CVA) with residual deficit - Residual right-sided weakness -Continue aspirin and statin  Microcytic anemia - hx IDA; low stores on 08/23/20 check - baseline Hgb 9-10 g/dL - also some contribution from CKD as well -Continue oral iron - currently at baseline   Diabetes mellitus type 2, uncontrolled - Last A1c 9.1% on 06/25/2020 - Continue SSI and CBG monitoring  Essential hypertension - morning meds held on 10/13 due to severe hypoTN; now resolved - resumed on 10/14  Abdominal pain, generalized-resolved as of 12/05/2020 - see Ogilvie       Procedures performed: Colonic decompression, 11/19/2020 and 12/03/2020  Condition at discharge: stable  Exam Physical Exam Constitutional:      Appearance: Normal appearance.  HENT:     Head: Normocephalic and atraumatic.     Mouth/Throat:     Mouth: Mucous membranes are moist.  Eyes:     Extraocular Movements: Extraocular movements intact.  Cardiovascular:     Rate and Rhythm: Normal rate and regular rhythm.     Heart sounds: Normal heart sounds.  Pulmonary:     Effort: Pulmonary effort is normal.     Breath sounds: Normal breath sounds. No wheezing.  Abdominal:     Comments: Mildly distended but remains soft, nontender.  Bowel sounds present in all 4 quadrants  Musculoskeletal:        General: Normal range of motion.     Cervical back: Normal range of motion and neck supple.  Skin:    General: Skin is warm and dry.  Neurological:     Mental Status: He is alert.     Comments: 4/5 strength in right upper and lower extremity at baseline  Psychiatric:        Mood and Affect: Mood normal.        Behavior: Behavior normal.     Disposition: Skilled nursing facility  Discharge time:  greater than 30 minutes.   Allergies as of 12/11/2020       Reactions   Other Anaphylaxis, Itching, Swelling   Allergy to GRITS , unknown exactly the specific ingredient he is allergic too        Medication List     STOP taking these medications    carvedilol 12.5 MG tablet Commonly known as: COREG       TAKE these medications    acetaminophen 650 MG CR tablet Commonly known as: TYLENOL Take 650 mg by mouth every 4 (four) hours as needed for pain (general discomfort).   amLODipine 10 MG tablet Commonly known as: NORVASC Take 10 mg by mouth daily.   aspirin 81 MG chewable tablet Chew 81 mg by mouth daily.   atorvastatin 80 MG tablet Commonly known as: LIPITOR Take 80 mg by mouth at bedtime.   empagliflozin 10 MG Tabs tablet Commonly known as: JARDIANCE Take 1 tablet (10 mg total) by mouth daily.   Entresto 49-51 MG Generic drug: sacubitril-valsartan Take 1 tablet by mouth 2 (two) times daily.   ferrous sulfate 325 (65 FE) MG tablet Take 1 tablet (325 mg total) by mouth daily.   insulin aspart 100 UNIT/ML injection Commonly known as: novoLOG Inject 2-12 Units into the skin See admin instructions. Check and record blood sugar  before meals and inject per ssi. 151-200 = 2 units  201-250 = 4 units  251-300 = 6 units  301-350 = 8 units  351-400 = 10 units  greater than 400 = 12 units and call MD.   insulin glargine 100 UNIT/ML injection Commonly known as: LANTUS Inject 0.15 mLs (15 Units total) into the skin at bedtime.   isosorbide-hydrALAZINE 20-37.5 MG tablet Commonly known as: BIDIL Take 1 tablet by mouth 3 (three) times daily.   loperamide 2 MG capsule Commonly known as: IMODIUM Take 1 capsule (2 mg total) by mouth every 6 (six) hours as needed for diarrhea or loose stools. What changed: when to take this   ondansetron 4 MG tablet Commonly known as: ZOFRAN Take 4 mg by mouth every 4 (four) hours as needed for nausea or vomiting.    pantoprazole 40 MG tablet Commonly known as: PROTONIX Take 40 mg by mouth daily.   polyethylene glycol 17 g packet Commonly known as: MIRALAX / GLYCOLAX Take 17 g by mouth daily as needed. What changed: reasons to take this   potassium chloride 10 MEQ tablet Commonly known as: KLOR-CON Take 2 tablets (20 mEq total) by mouth daily. What changed: when to take this   pregabalin 75 MG capsule Commonly known as: LYRICA Take 75 mg by mouth 2 (two) times daily.   saccharomyces boulardii 250 MG capsule Commonly known as: FLORASTOR Take 1 capsule (250 mg total) by mouth 2 (two) times daily.   senna-docusate 8.6-50 MG tablet Commonly known as: Senokot-S Take 1 tablet by mouth daily.   sertraline 50 MG tablet Commonly known as: ZOLOFT Take 50 mg by mouth daily.   tamsulosin 0.4 MG Caps capsule Commonly known as: FLOMAX Take 0.8 mg by mouth daily.   torsemide 20 MG tablet Commonly known as: DEMADEX Take 40 mg by mouth 2 (two) times daily.   Vitamin D (Ergocalciferol) 1.25 MG (50000 UNIT) Caps capsule Commonly known as: DRISDOL Take 50,000 Units by mouth once.               Discharge Care Instructions  (From admission, onward)           Start     Ordered   12/11/20 0000  Discharge wound care:       Comments: Apply zinc oxide barrier cream to bilateral shins, bilateral heels, and left lower buttock skin tear.  Place foam dressing over bilateral shin areas.   12/11/20 1303            DG Chest 1 View  Result Date: 11/17/2020 CLINICAL DATA:  Shortness of breath.  Abdominal pain and distension. EXAM: CHEST  1 VIEW COMPARISON:  Chest radiograph head CT 10/24/2020. FINDINGS: Patient is rotated. Stable cardiomegaly. Stable aortic tortuosity and atherosclerosis. No acute airspace disease, pleural effusion, or pneumothorax. Gaseous distention of stomach in the left upper quadrant is similar to prior exam. No acute osseous abnormalities are seen. IMPRESSION: No acute  findings. Stable low lung volumes with cardiomegaly and aortic tortuosity. Electronically Signed   By: Keith Rake M.D.   On: 11/17/2020 19:20   DG Elbow Complete Right  Result Date: 11/15/2020 CLINICAL DATA:  Status post fall. EXAM: RIGHT ELBOW - COMPLETE 3+ VIEW COMPARISON:  None. FINDINGS: The study is limited secondary to difficult patient positioning. There is no evidence of fracture, dislocation, or joint effusion. There is no evidence of arthropathy or other focal bone abnormality. Mild dorsal lateral soft tissue swelling is seen. IMPRESSION: Mild dorsal lateral  soft tissue swelling without evidence of acute fracture or dislocation. Electronically Signed   By: Virgina Norfolk M.D.   On: 11/15/2020 23:16   DG Wrist Complete Right  Result Date: 11/15/2020 CLINICAL DATA:  Status post fall. EXAM: RIGHT WRIST - COMPLETE 3+ VIEW COMPARISON:  None. FINDINGS: There is no evidence of fracture or dislocation. There is no evidence of arthropathy or other focal bone abnormality. There is moderate severity vascular calcification. Mild dorsal soft tissue swelling is seen. IMPRESSION: 1. Mild dorsal soft tissue swelling without an acute osseous abnormality. Electronically Signed   By: Virgina Norfolk M.D.   On: 11/15/2020 23:14   DG Abd 1 View  Result Date: 12/04/2020 CLINICAL DATA:  Abdominal distension. EXAM: ABDOMEN - 1 VIEW COMPARISON:  December 03, 2020. FINDINGS: No small bowel dilatation is noted. No abnormal calcifications are noted. There appears to be mildly decreased gaseous distention of the colon. IMPRESSION: Mildly decreased gaseous distention of the colon is noted suggesting improving ileus. Electronically Signed   By: Marijo Conception M.D.   On: 12/04/2020 15:44   DG Abd 1 View  Result Date: 12/03/2020 CLINICAL DATA:  Abdominal distension EXAM: ABDOMEN - 1 VIEW COMPARISON:  Portable exam 0912 hours compared to 12/02/2020 as well as multiple earlier exams FINDINGS: Diffuse gaseous  distention of the colon again seen. Transverse colon up to 11.9 cm transverse. On 1 image the sigmoid colon is significantly distended up to 16.5 cm diameter but appears decompressed on second image. Wall thickening of the sigmoid colon is identified, new. No gross evidence of free intraperitoneal air on supine portable exam. No definite rectal gas. This pattern of variable colonic distension as been present over numerous exams since 11/17/2020. 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 Chronic elevation of RIGHT diaphragm. Osseous structures unremarkable. IMPRESSION: Chronic gaseous distention of colon, sigmoid colon up to 16.5 cm on first image but decompressed on second image, with mild sigmoid wall thickening identified. 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 Wall thickening of the sigmoid colon however is new since previous exams, question edema versus colitis; consider CT assessment. Electronically Signed   By: Lavonia Dana M.D.   On: 12/03/2020 11:19   DG Abd 1 View  Result Date: 12/02/2020 CLINICAL DATA:  Recent bowel decompression, abdominal distention EXAM: ABDOMEN - 1 VIEW COMPARISON:  12/01/2020 abdominal radiograph FINDINGS: Marked diffuse gaseous distention of the colon, most prominent in the transverse colon measuring 11.6 cm diameter, increased from 8.8 cm diameter. Marked gaseous distention of the visualized sigmoid colon. The colonic distention appears worsened. No definite pneumatosis or pneumoperitoneum. No significant small bowel dilatation. No radiodense nephrolithiasis. IMPRESSION: Marked diffuse gaseous distention of the colon, most prominent in the transverse and sigmoid colon, worsened. No definite small bowel dilatation. Findings suggest worsening adynamic ileus. Electronically Signed   By: Ilona Sorrel M.D.   On: 12/02/2020 10:21   DG Abd 1 View  Result Date: 12/01/2020 CLINICAL DATA:  Abdominal distension EXAM: ABDOMEN - 1 VIEW COMPARISON:  11/30/2020,  11/29/2020 FINDINGS: Persistent diffuse gaseous distension of the colon, grossly similar compared to the previous exam. No gross free intraperitoneal air on AP supine view. IMPRESSION: Persistent diffuse gaseous distension of the colon, grossly similar compared to the previous exam. Electronically Signed   By: Davina Poke D.O.   On: 12/01/2020 14:55   DG Abd 1 View  Result Date: 11/30/2020 CLINICAL DATA:  Abdominal pain EXAM: ABDOMEN - 1 VIEW COMPARISON:  11/29/2020 FINDINGS: Persistent diffuse gaseous  distention of the colon, similar prior exam. There are likely some distended loops of small bowel as well. IMPRESSION: Persistent diffuse gaseous distention of the colon, similar to prior exam. Electronically Signed   By: Maurine Simmering M.D.   On: 11/30/2020 09:17   DG Abd 1 View  Result Date: 11/29/2020 CLINICAL DATA:  Ileus EXAM: ABDOMEN - 1 VIEW COMPARISON:  11/28/2020 FINDINGS: Enteric tube is no longer visualized and may be removed or retracted. Diffuse gaseous distension of the colon is grossly similar compared to the previous exam. No gross free intraperitoneal air on AP supine views. IMPRESSION: 1. Diffuse gaseous distension of the colon is grossly similar compared to the previous exam. 2. NG tube is no longer visualized. Electronically Signed   By: Davina Poke D.O.   On: 11/29/2020 11:29   DG Abd 1 View  Result Date: 11/28/2020 CLINICAL DATA:  Follow-up ileus. EXAM: ABDOMEN - 1 VIEW COMPARISON:  11/27/2020 and older studies. FINDINGS: Gaseous distention of colon appears increased from the previous day's exam, no current evidence of small bowel dilation. Nasal/orogastric tube is stable, tip projecting in the left upper quadrant. IMPRESSION: 1. Mild interval increase in gaseous distention of the colon compared to previous day's study. Electronically Signed   By: Lajean Manes M.D.   On: 11/28/2020 08:19   CT ABDOMEN PELVIS W CONTRAST  Result Date: 11/17/2020 CLINICAL DATA:  Distension  pain EXAM: CT ABDOMEN AND PELVIS WITH CONTRAST TECHNIQUE: Multidetector CT imaging of the abdomen and pelvis was performed using the standard protocol following bolus administration of intravenous contrast. CONTRAST:  31m OMNIPAQUE IOHEXOL 350 MG/ML SOLN COMPARISON:  CT 08/30/2020, radiograph 08/30/2020, CT 08/18/2020, CT 01/05/2020 FINDINGS: Lower chest: Lung bases demonstrate no acute consolidation or effusion. Small pericardial effusion measuring up to 18 mm in thickness. Borderline to mild cardiomegaly. Gynecomastia. Hepatobiliary: Gallstone. No biliary dilatation or focal hepatic abnormality. Pancreas: Unremarkable. No pancreatic ductal dilatation or surrounding inflammatory changes. Spleen: Normal in size without focal abnormality. Adrenals/Urinary Tract: Adrenal glands are unremarkable. Kidneys are normal, without renal calculi, focal lesion, or hydronephrosis. Bladder is unremarkable. Stomach/Bowel: The stomach is nonenlarged. No dilated small bowel. Considerable diffuse air distension of the colon without acute wall thickening. No definite mesenteric twisting to suggest volvulus. Moderate stool in the rectum Vascular/Lymphatic: Mild aortic atherosclerosis. No aneurysm. No suspicious nodes Reproductive: Prostate is unremarkable. Other: Negative for free air or effusion Musculoskeletal: No acute or suspicious osseous abnormality. IMPRESSION: 1. Considerable diffuse air distension of the colon but without evidence for volvulus, bowel wall thickening, or obstruction. 2. Gallstones Electronically Signed   By: KDonavan FoilM.D.   On: 11/17/2020 20:28   DG Shoulder Right Port  Result Date: 11/15/2020 CLINICAL DATA:  Pain after a fall. EXAM: PORTABLE RIGHT SHOULDER COMPARISON:  None. FINDINGS: Degenerative changes in the glenohumeral joint. No acute fracture or dislocation. Coracoclavicular and acromioclavicular spaces are maintained. Soft tissues are unremarkable. IMPRESSION: Degenerative changes.  No acute  displaced fractures identified. Electronically Signed   By: WLucienne CapersM.D.   On: 11/15/2020 19:22   DG Abd 2 Views  Result Date: 11/19/2020 CLINICAL DATA:  Follow-up rectosigmoid distention. EXAM: ABDOMEN - 2 VIEW COMPARISON:  11/18/2020 FINDINGS: Persistent gaseous distension of the colon. This appears unchanged from previous exam. No significant small bowel dilatation identified. IMPRESSION: No change in gaseous distension of the colon Electronically Signed   By: TKerby MoorsM.D.   On: 11/19/2020 06:21   DG Abd Acute W/Chest  Result Date: 11/18/2020 CLINICAL  DATA:  Ileus EXAM: DG ABDOMEN ACUTE WITH 1 VIEW CHEST COMPARISON:  CT 11/17/2020, 08/30/2020 FINDINGS: Single-view chest demonstrates low lung volumes. No focal opacity or pleural effusion. Stable cardiomegaly. No pneumothorax. Supine and upright views of the abdomen demonstrate no free air beneath the diaphragm. No significant change in moderate to marked diffuse air distension of the colon. Rectosigmoid colon distension up to 12.4 cm. Excreted contrast in the urinary bladder. IMPRESSION: 1. Low lung volumes.  Cardiomegaly. 2. No significant change in moderate to marked diffuse air distension of the colon with colon distension up to 12.4 cm. Electronically Signed   By: Donavan Foil M.D.   On: 11/18/2020 16:27   DG Abd Portable 1V  Result Date: 12/10/2020 CLINICAL DATA:  Ogilvie syndrome EXAM: PORTABLE ABDOMEN - 1 VIEW COMPARISON:  KUB 12/08/2020 FINDINGS: Compared to the study from 12/08/2020, gaseous distention of the stomach has decreased. Mild gaseous distention of the bowel throughout the remainder of the abdomen is overall not significantly changed. A large stool burden in the right colon is again seen. IMPRESSION: Decreased gaseous distention of the stomach since 12/08/2020. Otherwise, overall no significant interval change in degree of gaseous distension of the bowel throughout the remainder of the abdomen. Unchanged large right  colonic stool burden. Electronically Signed   By: Valetta Mole M.D.   On: 12/10/2020 10:01   DG Abd Portable 1V  Result Date: 12/08/2020 CLINICAL DATA:  Abdominal distension. EXAM: PORTABLE ABDOMEN - 1 VIEW COMPARISON:  12/07/2020 FINDINGS: There is increased, mild-to-moderate gaseous distension of the stomach. There is mild gaseous distension of the transverse colon which has further mildly improved. Gas is present throughout other loops of nondilated small and large bowel throughout the abdomen. The visualized lung bases are grossly clear. No acute osseous abnormality is seen. IMPRESSION: Increased gaseous distension of the stomach. Mildly decreased gaseous distension of the transverse colon. Electronically Signed   By: Logan Bores M.D.   On: 12/08/2020 10:38   DG Abd Portable 1V  Result Date: 12/07/2020 CLINICAL DATA:  Ogilvie's syndrome EXAM: PORTABLE ABDOMEN - 1 VIEW COMPARISON:  12/06/2020 FINDINGS: Diminished exam detail due to body habitus. Continued interval improvement in the appearance of gaseous distension of the transverse colon. No radio-opaque calculi or other significant radiographic abnormality are seen. IMPRESSION: Continued interval improvement in gaseous distension of the transverse colon. Electronically Signed   By: Kerby Moors M.D.   On: 12/07/2020 07:57   DG Abd Portable 1V  Result Date: 12/06/2020 CLINICAL DATA:  Ogilvie syndrome EXAM: PORTABLE ABDOMEN - 1 VIEW COMPARISON:  12/04/2020 FINDINGS: Two supine frontal views of the abdomen and pelvis demonstrate persistent gaseous distension of the transverse colon measuring up to 11.6 cm in diameter, previously having measured 15.5 cm. No abdominal masses or abnormal calcifications. No acute bony abnormalities. IMPRESSION: 1. Persistent but decreased gaseous distension of the transverse colon. Electronically Signed   By: Randa Ngo M.D.   On: 12/06/2020 18:49   DG Abd Portable 1V  Result Date: 11/27/2020 CLINICAL DATA:   Follow-up ileus. EXAM: PORTABLE ABDOMEN - 1 VIEW COMPARISON:  Abdominal x-ray dated November 25, 2020. FINDINGS: Unchanged enteric tube, incompletely visualized. Diffusely dilated air-filled small bowel and colon, similar to prior study. IMPRESSION: 1. Unchanged ileus. Electronically Signed   By: Titus Dubin M.D.   On: 11/27/2020 12:58   DG Abd Portable 1V  Result Date: 11/25/2020 CLINICAL DATA:  Abdominal distention EXAM: PORTABLE ABDOMEN - 1 VIEW COMPARISON:  November 24, 2020 at 4:41 a.m.  FINDINGS: Stable NG tube, terminating in the stomach. Air-filled prominent/dilated loops of bowel, primarily colon. The findings are similar in the interval. IMPRESSION: Continued diffuse gaseous distention of the bowel, unchanged. Stable NG tube, terminating in the stomach. Electronically Signed   By: Dorise Bullion III M.D.   On: 11/25/2020 08:01   DG Abd Portable 1V  Result Date: 11/24/2020 CLINICAL DATA:  Abdominal distention EXAM: PORTABLE ABDOMEN - 1 VIEW COMPARISON:  11/22/2020 FINDINGS: Diffuse gaseous distention of bowel, unchanged. No free air or organomegaly. NG tube remains in the stomach. IMPRESSION: Diffuse gaseous distention of bowel, unchanged. Electronically Signed   By: Rolm Baptise M.D.   On: 11/24/2020 07:16   DG Abd Portable 1V  Result Date: 11/22/2020 CLINICAL DATA:  Abdominal distension.  Known Ogilvie syndrome. EXAM: PORTABLE ABDOMEN - 1 VIEW COMPARISON:  11/21/2020 FINDINGS: Gaseous distention of the colon appears similar to prior radiographs and may be slightly more prominent compared to the prior x-ray. Findings are similar to a prior CT on 11/17/2020. No gross signs of free air. Nasogastric tube again demonstrated extending into the proximal stomach. IMPRESSION: No significant change in appearance of gaseous distension of the colon consistent with Ogilvie syndrome (colonic pseudo-obstruction). Electronically Signed   By: Aletta Edouard M.D.   On: 11/22/2020 14:03   DG Abd Portable  1V  Result Date: 11/21/2020 CLINICAL DATA:  Nasogastric tube placement EXAM: PORTABLE ABDOMEN - 1 VIEW COMPARISON:  11/19/2020 FINDINGS: Right flank and pelvis are excluded from view. The visualized abdominal gas pattern is nonobstructive. Nasogastric tube is seen with its tip position within the gastric fundus. IMPRESSION: Nasogastric tube tip within the gastric fundus. Electronically Signed   By: Fidela Salisbury M.D.   On: 11/21/2020 03:48   DG Hand Complete Right  Result Date: 11/15/2020 CLINICAL DATA:  Injury from a fall.  Right hand pain. EXAM: RIGHT HAND - COMPLETE 3+ VIEW COMPARISON:  None. FINDINGS: Mild degenerative changes in the interphalangeal joints. No evidence of acute fracture or dislocation. No focal bone lesions. Dorsal soft tissue swelling over the right hand. No radiopaque foreign bodies. Vascular calcifications. IMPRESSION: Degenerative changes.  Soft tissue swelling.  No acute fractures. Electronically Signed   By: Lucienne Capers M.D.   On: 11/15/2020 19:21   Results for orders placed or performed during the hospital encounter of 11/18/20  Resp Panel by RT-PCR (Flu A&B, Covid) Nasopharyngeal Swab     Status: None   Collection Time: 11/18/20  8:23 PM   Specimen: Nasopharyngeal Swab; Nasopharyngeal(NP) swabs in vial transport medium  Result Value Ref Range Status   SARS Coronavirus 2 by RT PCR NEGATIVE NEGATIVE Final    Comment: (NOTE) SARS-CoV-2 target nucleic acids are NOT DETECTED.  The SARS-CoV-2 RNA is generally detectable in upper respiratory specimens during the acute phase of infection. The lowest concentration of SARS-CoV-2 viral copies this assay can detect is 138 copies/mL. A negative result does not preclude SARS-Cov-2 infection and should not be used as the sole basis for treatment or other patient management decisions. A negative result may occur with  improper specimen collection/handling, submission of specimen other than nasopharyngeal swab, presence of  viral mutation(s) within the areas targeted by this assay, and inadequate number of viral copies(<138 copies/mL). A negative result must be combined with clinical observations, patient history, and epidemiological information. The expected result is Negative.  Fact Sheet for Patients:  EntrepreneurPulse.com.au  Fact Sheet for Healthcare Providers:  IncredibleEmployment.be  This test is no t yet approved or cleared  by the [place] and  has been authorized for detection and/or diagnosis of SARS-CoV-2 by FDA under an Emergency Use Authorization (EUA). This EUA will remain  in effect (meaning this test can be used) for the duration of the COVID-19 declaration under Section 564(b)(1) of the Act, 21 U.S.C.section 360bbb-3(b)(1), unless the authorization is terminated  or revoked sooner.       Influenza A by PCR NEGATIVE NEGATIVE Final   Influenza B by PCR NEGATIVE NEGATIVE Final    Comment: (NOTE) The Xpert Xpress SARS-CoV-2/FLU/RSV plus assay is intended as an aid in the diagnosis of influenza from Nasopharyngeal swab specimens and should not be used as a sole basis for treatment. Nasal washings and aspirates are unacceptable for Xpert Xpress SARS-CoV-2/FLU/RSV testing.  Fact Sheet for Patients: EntrepreneurPulse.com.au  Fact Sheet for Healthcare Providers: IncredibleEmployment.be  This test is not yet approved or cleared by the [place] FDA and has been authorized for detection and/or diagnosis of SARS-CoV-2 by FDA under an Emergency Use Authorization (EUA). This EUA will remain in effect (meaning this test can be used) for the duration of the COVID-19 declaration under Section 564(b)(1) of the Act, 21 U.S.C. section 360bbb-3(b)(1), unless the authorization is terminated or revoked.  Performed at [facility], [place] [address], [place], [place] [address]   MRSA Next Gen by  PCR, Nasal     Status: None   Collection Time: 12/06/20  2:53 PM   Specimen: Nasal Mucosa; Nasal Swab  Result Value Ref Range Status   MRSA by PCR Next Gen NOT DETECTED NOT DETECTED Final    Comment: (NOTE) The GeneXpert MRSA Assay (FDA approved for NASAL specimens only), is one component of a comprehensive MRSA colonization surveillance program. It is not intended to diagnose MRSA infection nor to guide or monitor treatment for MRSA infections. Test performance is not FDA approved in patients less than 62 years old. Performed at [facility], [place] [address] [place], [place] [address]   Resp Panel by RT-PCR (Flu A&B, Covid) Nasopharyngeal Swab     Status: None   Collection Time: 12/11/20 11:27 AM   Specimen: Nasopharyngeal Swab; Nasopharyngeal(NP) swabs in vial transport medium  Result Value Ref Range Status   SARS Coronavirus 2 by RT PCR NEGATIVE NEGATIVE Final    Comment: (NOTE) SARS-CoV-2 target nucleic acids are NOT DETECTED.  The SARS-CoV-2 RNA is generally detectable in upper respiratory specimens during the acute phase of infection. The lowest concentration of SARS-CoV-2 viral copies this assay can detect is 138 copies/mL. A negative result does not preclude SARS-Cov-2 infection and should not be used as the sole basis for treatment or other patient management decisions. A negative result may occur with  improper specimen collection/handling, submission of specimen other than nasopharyngeal swab, presence of viral mutation(s) within the areas targeted by this assay, and inadequate number of viral copies(<138 copies/mL). A negative result must be combined with clinical observations, patient history, and epidemiological information. The expected result is Negative.  Fact Sheet for Patients:  EntrepreneurPulse.com.au  Fact Sheet for Healthcare Providers:  IncredibleEmployment.be  This test is no t yet approved or  cleared by the [place] FDA and  has been authorized for detection and/or diagnosis of SARS-CoV-2 by FDA under an Emergency Use Authorization (EUA). This EUA will remain  in effect (meaning this test can be used) for the duration of the COVID-19 declaration under Section 564(b)(1) of the Act, 21 U.S.C.section 360bbb-3(b)(1), unless the authorization is terminated  or revoked sooner.       Influenza A by PCR NEGATIVE NEGATIVE Final   Influenza B by PCR NEGATIVE NEGATIVE Final    Comment: (NOTE) The Xpert Xpress SARS-CoV-2/FLU/RSV plus assay is intended as an aid in the diagnosis of influenza from Nasopharyngeal swab specimens and should not be used as a sole basis for treatment. Nasal washings and aspirates are unacceptable for Xpert Xpress SARS-CoV-2/FLU/RSV testing.  Fact Sheet for Patients: EntrepreneurPulse.com.au  Fact Sheet for Healthcare Providers: IncredibleEmployment.be  This test is not yet approved or cleared by the [place] FDA and has been authorized for detection and/or diagnosis of SARS-CoV-2 by FDA under an Emergency Use Authorization (EUA). This EUA will remain in effect (meaning this test can be used) for the duration of the COVID-19 declaration under Section 564(b)(1) of the Act, 21 U.S.C. section 360bbb-3(b)(1), unless the authorization is terminated or revoked.  Performed at [facility], [place] [address] [place], [place] [address]     Signed:  Dwyane Dee MD.  [facility] 12/11/2020, 1:11 PM

## 2020-12-11 NOTE — Progress Notes (Signed)
Nutrition Follow-up  INTERVENTION:   -Ensure Plus PO BID, each provides 350 kcals and 13g protein  -D/c Boost Breeze, Prosource Plus  -Multivitamin with minerals daily  NUTRITION DIAGNOSIS:   Inadequate oral intake related to acute illness (persistent adynamic ileus) as evidenced by other (comment) (need for liquid diet or NPO status since admission).  Improved.  GOAL:   Patient will meet greater than or equal to 90% of their needs  Progressing  MONITOR:   Diet advancement, PO intake, Supplement acceptance, Labs, Weight trends  ASSESSMENT:   60 year old male with medical history of heart failure, HTN, CVA, type 2 DM with neuropathy, stage 3 CKD, polysubstance abuse, chronic pain, depression, and HLD. He presented to the ED due to worsening abdominal pain and significant gastric distention. He had gastric decompression on 11/19/20 which did not provide relief. NGT in place 9/25-10/4. Improvement has been very slow. Patient is bedbound. Most recent repeat KUB showed worsening adynamic ileus. Plan for repeat KUB on 10/10.  Patient lethargic during visit. Pt didn't answer many of RD's questions. RN in room, providing medications. States pt ate a bacon egg and cheese biscuit for breakfast. Pt did report he thought Boost Breeze was "nasty". Will d/c that and Prosource. He was willing to try Ensure vanilla or chocolate flavor.   Noted in chart, pt to discharge to SNF today.  Admission weight: 281 lbs. Last recorded weight: 263 lbs  Medications: Ferrous sulfate, Multivitamin with minerals daily, Miralax, Florastor, Metamucil, Senokot  Labs reviewed:  CBGs: 166-239 Low Na Elevated K  NUTRITION - FOCUSED PHYSICAL EXAM:  No depletions noted.  Diet Order:   Diet Order             Diet - low sodium heart healthy           DIET SOFT Room service appropriate? Yes; Fluid consistency: Thin  Diet effective now                   EDUCATION NEEDS:   Not appropriate for  education at this time  Skin:  Skin Assessment: Skin Integrity Issues: Skin Integrity Issues:: Other (Comment) DTI: bilateral heels Other: skin tear to L buttocks, R buttocks, R thigh, penis  Last BM:  10/17 -type 7  Height:   Ht Readings from Last 1 Encounters:  12/08/20 5\' 8"  (1.727 m)    Weight:   Wt Readings from Last 1 Encounters:  12/08/20 119.5 kg    Ideal Body Weight:  70 kg  BMI:  Body mass index is 40.06 kg/m.  Estimated Nutritional Needs:   Kcal:  1950-2200 kcal  Protein:  95-110 grams  Fluid:  >/= 2.2 L/day   Clayton Bibles, MS, RD, LDN Inpatient Clinical Dietitian Contact information available via Amion

## 2020-12-11 NOTE — TOC Transition Note (Signed)
Transition of Care [facility]) - CM/SW Discharge Note   Patient Details  Name: [name] MRN: [id number] Date of Birth: 1960/09/12  Transition of Care [facility]) CM/SW Contact:  Lynnell Catalan, RN Phone Number: 12/11/2020, 1:41 PM   Clinical Narrative:     Pt to dc back to [facility] today. He will go to room 105a. [facility] contacted for transport. RN to call report to [phone].    Barriers to Discharge: Continued Medical Work up   Patient Goals and CMS Choice Patient states their goals for this hospitalization and ongoing recovery are:: to go home CMS Medicare.gov Compare Post Acute Care list provided to:: Patient  Discharge Plan and Services   Discharge Planning Services: CM Consult               Readmission Risk Interventions Readmission Risk Prevention Plan 01/12/2020  Transportation Screening Complete  Palliative Care Screening Not Applicable  Some recent data might be hidden

## 2020-12-12 DIAGNOSIS — I10 Essential (primary) hypertension: Secondary | ICD-10-CM | POA: Diagnosis not present

## 2020-12-12 DIAGNOSIS — D649 Anemia, unspecified: Secondary | ICD-10-CM | POA: Diagnosis not present

## 2020-12-12 DIAGNOSIS — E119 Type 2 diabetes mellitus without complications: Secondary | ICD-10-CM | POA: Diagnosis not present

## 2020-12-12 DIAGNOSIS — E559 Vitamin D deficiency, unspecified: Secondary | ICD-10-CM | POA: Diagnosis not present

## 2020-12-13 DIAGNOSIS — K5981 Ogilvie syndrome: Secondary | ICD-10-CM | POA: Diagnosis not present

## 2020-12-13 DIAGNOSIS — D508 Other iron deficiency anemias: Secondary | ICD-10-CM | POA: Diagnosis not present

## 2020-12-13 DIAGNOSIS — E876 Hypokalemia: Secondary | ICD-10-CM | POA: Diagnosis not present

## 2020-12-13 DIAGNOSIS — N179 Acute kidney failure, unspecified: Secondary | ICD-10-CM | POA: Diagnosis not present

## 2020-12-14 DIAGNOSIS — K5981 Ogilvie syndrome: Secondary | ICD-10-CM | POA: Diagnosis not present

## 2020-12-14 DIAGNOSIS — E876 Hypokalemia: Secondary | ICD-10-CM | POA: Diagnosis not present

## 2020-12-14 DIAGNOSIS — D508 Other iron deficiency anemias: Secondary | ICD-10-CM | POA: Diagnosis not present

## 2020-12-14 DIAGNOSIS — N179 Acute kidney failure, unspecified: Secondary | ICD-10-CM | POA: Diagnosis not present

## 2020-12-15 ENCOUNTER — Encounter (HOSPITAL_COMMUNITY): Payer: Self-pay | Admitting: Radiology

## 2020-12-18 ENCOUNTER — Emergency Department (HOSPITAL_COMMUNITY)
Admission: EM | Admit: 2020-12-18 | Discharge: 2020-12-19 | Disposition: A | Discharge status: Home / Self Care | Payer: Medicare HMO | Attending: Emergency Medicine | Admitting: Emergency Medicine

## 2020-12-18 DIAGNOSIS — H5710 Ocular pain, unspecified eye: Secondary | ICD-10-CM | POA: Insufficient documentation

## 2020-12-18 DIAGNOSIS — H547 Unspecified visual loss: Secondary | ICD-10-CM | POA: Diagnosis not present

## 2020-12-18 DIAGNOSIS — E1165 Type 2 diabetes mellitus with hyperglycemia: Secondary | ICD-10-CM | POA: Insufficient documentation

## 2020-12-18 DIAGNOSIS — H538 Other visual disturbances: Secondary | ICD-10-CM | POA: Insufficient documentation

## 2020-12-18 DIAGNOSIS — I6782 Cerebral ischemia: Secondary | ICD-10-CM | POA: Diagnosis not present

## 2020-12-18 DIAGNOSIS — H53133 Sudden visual loss, bilateral: Secondary | ICD-10-CM | POA: Diagnosis not present

## 2020-12-18 DIAGNOSIS — H53131 Sudden visual loss, right eye: Secondary | ICD-10-CM | POA: Insufficient documentation

## 2020-12-18 DIAGNOSIS — I5033 Acute on chronic diastolic (congestive) heart failure: Secondary | ICD-10-CM | POA: Insufficient documentation

## 2020-12-18 DIAGNOSIS — F331 Major depressive disorder, recurrent, moderate: Secondary | ICD-10-CM | POA: Insufficient documentation

## 2020-12-18 DIAGNOSIS — Z7984 Long term (current) use of oral hypoglycemic drugs: Secondary | ICD-10-CM | POA: Diagnosis not present

## 2020-12-18 DIAGNOSIS — R21 Rash and other nonspecific skin eruption: Secondary | ICD-10-CM | POA: Insufficient documentation

## 2020-12-18 DIAGNOSIS — N1831 Chronic kidney disease, stage 3a: Secondary | ICD-10-CM | POA: Diagnosis not present

## 2020-12-18 DIAGNOSIS — Z794 Long term (current) use of insulin: Secondary | ICD-10-CM | POA: Insufficient documentation

## 2020-12-18 DIAGNOSIS — Z20822 Contact with and (suspected) exposure to covid-19: Secondary | ICD-10-CM | POA: Insufficient documentation

## 2020-12-18 DIAGNOSIS — Z79899 Other long term (current) drug therapy: Secondary | ICD-10-CM | POA: Insufficient documentation

## 2020-12-18 DIAGNOSIS — Z87891 Personal history of nicotine dependence: Secondary | ICD-10-CM | POA: Diagnosis not present

## 2020-12-18 DIAGNOSIS — Z743 Need for continuous supervision: Secondary | ICD-10-CM | POA: Diagnosis not present

## 2020-12-18 DIAGNOSIS — Z7982 Long term (current) use of aspirin: Secondary | ICD-10-CM | POA: Insufficient documentation

## 2020-12-18 DIAGNOSIS — I130 Hypertensive heart and chronic kidney disease with heart failure and stage 1 through stage 4 chronic kidney disease, or unspecified chronic kidney disease: Secondary | ICD-10-CM | POA: Diagnosis not present

## 2020-12-18 DIAGNOSIS — H5461 Unqualified visual loss, right eye, normal vision left eye: Secondary | ICD-10-CM | POA: Diagnosis not present

## 2020-12-18 DIAGNOSIS — R6889 Other general symptoms and signs: Secondary | ICD-10-CM | POA: Diagnosis not present

## 2020-12-18 DIAGNOSIS — E1122 Type 2 diabetes mellitus with diabetic chronic kidney disease: Secondary | ICD-10-CM | POA: Insufficient documentation

## 2020-12-18 DIAGNOSIS — E1140 Type 2 diabetes mellitus with diabetic neuropathy, unspecified: Secondary | ICD-10-CM | POA: Diagnosis not present

## 2020-12-18 DIAGNOSIS — K5981 Ogilvie syndrome: Secondary | ICD-10-CM | POA: Diagnosis not present

## 2020-12-18 DIAGNOSIS — G319 Degenerative disease of nervous system, unspecified: Secondary | ICD-10-CM | POA: Diagnosis not present

## 2020-12-18 DIAGNOSIS — H40051 Ocular hypertension, right eye: Secondary | ICD-10-CM | POA: Diagnosis not present

## 2020-12-18 DIAGNOSIS — R443 Hallucinations, unspecified: Secondary | ICD-10-CM | POA: Diagnosis not present

## 2020-12-18 DIAGNOSIS — H543 Unqualified visual loss, both eyes: Secondary | ICD-10-CM | POA: Insufficient documentation

## 2020-12-18 MED ORDER — TETRACAINE HCL 0.5 % OP SOLN
1.0000 [drp] | Freq: Once | OPHTHALMIC | Status: AC
  Administered 2020-12-18: 1 [drp] via OPHTHALMIC
  Filled 2020-12-18: qty 4

## 2020-12-18 NOTE — ED Provider Notes (Signed)
[facility] EMERGENCY DEPARTMENT Provider Note   CSN: [id number] Arrival date & time: 12/18/20  2106     History Chief Complaint  Patient presents with   Eye Pain    [name] is a 61 y.o. male.  Patient with history of congestive heart failure, chronic kidney disease, stroke with right-sided deficits, bipolar, high blood pressure presents with worsening vision loss and eye pain for the past 2 to 3 weeks.  Patient said currently cannot see anything out of the right eye and blurry in the left eye.  Patient was admitted recently for Ogilvie syndrome.  Patient denies significant headache, temple pain, fevers chills or vomiting.  Patient has no chest pain or shortness of breath.  Patient lives at [facility] facility.  Patient denies known history of glaucoma or other eye pathology.  Patient has diabetes history which is uncontrolled.      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   Depression 11/18/2020   Acute renal failure superimposed on stage 3a chronic kidney disease ([place]) 11/18/2020   BPH (benign prostatic hyperplasia) 11/18/2020   AKI (acute kidney injury) ([place]) 08/22/2020   CVA ([place]) with residual right-sided weakness 08/22/2020   Pericardial effusion 08/22/2020   Acute on chronic diastolic CHF (congestive heart failure) ([place]) 08/22/2020   TIA (transient ischemic attack) 07/03/2020   Acute on chronic heart failure with preserved ejection fraction ([place])    Pulmonary artery hypertension ([place]) 06/25/2020   CKD (chronic kidney disease) 06/25/2020   History of cerebrovascular accident (CVA) with residual  deficit 06/25/2020   CKD stage IIIa ([place]) 03/30/2020   Morbid (severe) obesity due to excess calories ([place]) 03/30/2020   Malnutrition of moderate degree 01/25/2020   Palliative care by specialist    Goals of care, counseling/discussion    Nephrotic syndrome 01/06/2020   Anasarca 01/06/2020   Hypokalemia due to loss of potassium 01/04/2020   Generalized body aches 01/01/2020   Non-traumatic rhabdomyolysis 01/01/2020   Chronic diastolic CHF (congestive heart failure) ([place]) 01/01/2020   Microcytic anemia 01/01/2020   Hypokalemia 12/31/2019   Elevated CK    Diabetes mellitus type 2, uncontrolled    Pure hypercholesterolemia    Hyperglycemia 09/28/2017   Nonobstructive atherosclerosis of coronary artery 09/28/2017   MDD (major depressive disorder), recurrent episode, severe ([place]) 07/30/2017   Osteoarthritis 04/12/2015   Severe recurrent major depression with psychotic features ([place]) 01/04/2015   Substance-related disorder ([place]) 10/30/2014   Patient's noncompliance with other medical treatment and regimen 06/07/2014   Cocaine dependence, uncomplicated ([place]) 38/75/6433   Chronic pain 05/31/2014   Cocaine abuse ([place]) 05/31/2014   History of noncompliance with medical treatment 05/31/2014   Malingering 05/31/2014   Leukopenia 05/31/2014   Diabetic neuropathy associated with diabetes mellitus due to underlying condition ([place]) 05/31/2014   Diabetic neuropathy ([place]) 05/31/2014   Depression, major, recurrent, severe with psychosis ([place]) 08/29/2013   Suicidal ideations 08/28/2013   Uncontrolled diabetes mellitus 06/30/2013   Atypical chest pain 12/09/2011   Essential hypertension 12/09/2011   Episodic mood disorder ([place]) 05/13/2011  Past Surgical History:  Procedure Laterality Date   BOWEL DECOMPRESSION N/A 01/19/2020   Procedure: BOWEL DECOMPRESSION;  Surgeon: Otis Brace, MD;  Location: [facility];  Service: Gastroenterology;  Laterality: N/A;   BOWEL DECOMPRESSION N/A  11/19/2020   Procedure: BOWEL DECOMPRESSION;  Surgeon: Wilford Corner, MD;  Location: [facility];  Service: Endoscopy;  Laterality: N/A;   BOWEL DECOMPRESSION N/A 12/03/2020   Procedure: BOWEL DECOMPRESSION;  Surgeon: Ronnette Juniper, MD;  Location: [facility];  Service: Gastroenterology;  Laterality: N/A;   COLONOSCOPY WITH PROPOFOL N/A 01/19/2020   Procedure: COLONOSCOPY WITH PROPOFOL;  Surgeon: Otis Brace, MD;  Location: [facility];  Service: Gastroenterology;  Laterality: N/A;   COLONOSCOPY WITH PROPOFOL N/A 11/19/2020   Procedure: COLONOSCOPY WITH PROPOFOL;  Surgeon: Wilford Corner, MD;  [facility];  Service: Endoscopy;  Laterality: N/A;   COLONOSCOPY WITH PROPOFOL N/A 12/03/2020   Procedure: COLONOSCOPY WITH PROPOFOL;  Surgeon: Ronnette Juniper, MD;  [facility]: [facility];  Service: Gastroenterology;  Laterality: N/A;   KNEE SURGERY     b/l knees; pending total knee    RADIOLOGY WITH ANESTHESIA N/A 07/04/2020   Procedure: MRI WITH ANESTHESIA BRAIN WITHOUT CONTRAST AND HEAD WITHOUT CONTRAST;  Surgeon: Radiologist, Medication, MD;  Location: [facility];  Service: Radiology;  Laterality: N/A;       Family History  Problem Relation Age of Onset   Diabetes Other    Cancer Mother        uterus; deceased    Heart attack Mother 18   Hypertension Other    Heart attack Father 56   Stroke Sister        12/2012 age 67 y.o    Social History   Tobacco Use   Smoking status: Former   Smokeless tobacco: Never  Scientific laboratory technician Use: Never used  Substance Use Topics   Alcohol use: No   Drug use: Not Currently    Types: Cocaine    Comment: Last use 2019    Home Medications Prior to Admission medications   Medication Sig Start Date End Date Taking? Authorizing Provider  acetaminophen (TYLENOL) 650 MG CR tablet Take 650 mg by mouth every 4 (four) hours as needed for pain (general discomfort).    [provider]  amLODipine (NORVASC) 10 MG tablet Take 10 mg by  mouth daily.    [provider]  aspirin 81 MG chewable tablet Chew 81 mg by mouth daily.    [provider]  atorvastatin (LIPITOR) 80 MG tablet Take 80 mg by mouth at bedtime.    [provider]  empagliflozin (JARDIANCE) 10 MG TABS tablet Take 1 tablet (10 mg total) by mouth daily. 09/23/20   Raiford Noble Latif, DO  ferrous sulfate 325 (65 FE) MG tablet Take 1 tablet (325 mg total) by mouth daily. 09/23/20   [name], Georgina Quint Latif, DO  insulin aspart (NOVOLOG) 100 UNIT/ML injection Inject 2-12 Units into the skin See admin instructions. Check and record blood sugar before meals and inject per ssi. 151-200 = 2 units  201-250 = 4 units  251-300 = 6 units  301-350 = 8 units  351-400 = 10 units  greater than 400 = 12 units and call MD. 09/07/20   Antonieta Pert, MD  insulin glargine (LANTUS) 100 UNIT/ML injection Inject 0.15 mLs (15 Units total) into the skin at bedtime. 09/22/20   [name], [name], DO  isosorbide-hydrALAZINE (BIDIL) 20-37.5 MG tablet Take 1 tablet by mouth 3 (three) times daily. 09/07/20  Antonieta Pert, MD  loperamide (IMODIUM) 2 MG capsule Take 1 capsule (2 mg total) by mouth every 6 (six) hours as needed for diarrhea or loose stools. 12/11/20   Dwyane Dee, MD  ondansetron (ZOFRAN) 4 MG tablet Take 4 mg by mouth every 4 (four) hours as needed for nausea or vomiting.    [provider]  pantoprazole (PROTONIX) 40 MG tablet Take 40 mg by mouth daily.    [provider]  polyethylene glycol (MIRALAX / GLYCOLAX) 17 g packet Take 17 g by mouth daily as needed. Patient taking differently: Take 17 g by mouth daily as needed for mild constipation. 03/30/20   [name], NP  potassium chloride (KLOR-CON) 10 MEQ tablet Take 2 tablets (20 mEq total) by mouth daily. 12/11/20   Dwyane Dee, MD  pregabalin (LYRICA) 75 MG capsule Take 75 mg by mouth 2 (two) times daily.    [provider]  saccharomyces boulardii (FLORASTOR) 250 MG  capsule Take 1 capsule (250 mg total) by mouth 2 (two) times daily. 09/07/20   Antonieta Pert, MD  sacubitril-valsartan (ENTRESTO) 49-51 MG Take 1 tablet by mouth 2 (two) times daily.    [provider]  senna-docusate (SENOKOT-S) 8.6-50 MG tablet Take 1 tablet by mouth daily.    [provider]  sertraline (ZOLOFT) 50 MG tablet Take 50 mg by mouth daily.    [provider]  tamsulosin (FLOMAX) 0.4 MG CAPS capsule Take 0.8 mg by mouth daily.    [provider]  torsemide (DEMADEX) 20 MG tablet Take 40 mg by mouth 2 (two) times daily.    [provider]  Vitamin D, Ergocalciferol, (DRISDOL) 1.25 MG (50000 UNIT) CAPS capsule Take 50,000 Units by mouth once.    [provider]    Allergies    Other  Review of Systems   Review of Systems  Constitutional:  Negative for chills and fever.  HENT:  Negative for congestion.   Eyes:  Positive for visual disturbance.  Respiratory:  Negative for shortness of breath.   Cardiovascular:  Negative for chest pain.  Gastrointestinal:  Negative for abdominal pain and vomiting.  Genitourinary:  Negative for dysuria and flank pain.  Musculoskeletal:  Negative for back pain, neck pain and neck stiffness.  Skin:  Negative for rash.  Neurological:  Positive for weakness. Negative for light-headedness and headaches.   Physical Exam Updated Vital Signs BP 133/87   Pulse 87   Temp 98.6 F (37 C) (Oral)   Resp 18   SpO2 100%   Physical Exam Vitals and nursing note reviewed.  Constitutional:      General: He is not in acute distress.    Appearance: He is well-developed.  HENT:     Head: Normocephalic.     Mouth/Throat:     Mouth: Mucous membranes are moist.  Eyes:     General:        Right eye: No discharge.        Left eye: No discharge.     Conjunctiva/sclera: Conjunctivae normal.  Neck:     Trachea: No tracheal deviation.  Cardiovascular:     Rate and Rhythm: Normal rate.  Pulmonary:      Effort: Pulmonary effort is normal.  Abdominal:     General: There is no distension.     Palpations: Abdomen is soft.     Tenderness: There is no abdominal tenderness. There is no guarding.  Musculoskeletal:        General:  No swelling.     Cervical back: Normal range of motion and neck supple. No rigidity.  Skin:    General: Skin is warm.     Capillary Refill: Capillary refill takes less than 2 seconds.     Findings: Rash present.     Comments: Patient has multiple chronic wounds/ulcers lower extremities bilateral.  Neurological:     Mental Status: He is alert.     Cranial Nerves: Cranial nerve deficit present.     Comments: Patient has approximate 5 to 6 mm pupil on the right not responsive to light, 2 to 3 mm pupil on the left minimal responsive to light.  Patient has mild flinching to fingers on the left eye, none on the right.  Patient will track extraocular muscle function to command but not necessarily following fingers.  Patient can see blurry fingers in the left eye, unable to see/identify fingers in the right.  No temple tenderness.  Patient is weakness in the right extremities he says this is chronic.  Psychiatric:        Mood and Affect: Mood normal.    ED Results / Procedures / Treatments   Labs (all labs ordered are listed, but only abnormal results are displayed) Labs Reviewed  BASIC METABOLIC PANEL  CBC WITH DIFFERENTIAL/PLATELET  MAGNESIUM    EKG None  Radiology No results found.  Procedures Procedures   Medications Ordered in ED Medications  tetracaine (PONTOCAINE) 0.5 % ophthalmic solution 1-2 drop (1 drop Both Eyes Given by Other 12/18/20 2216)    ED Course  I have reviewed the triage vital signs and the nursing notes.  Pertinent labs & imaging results that were available during my care of the patient were reviewed by me and considered in my medical decision making (see chart for details).    MDM Rules/Calculators/A&P                            Patient presents with vision loss bilateral worse than the right with unequal pupils.  Reviewed medical records and recent admission and discharge summary for Ogilvie syndrome.  Unable to find detailed neuro exam to help compare cranial nerves/pupils/vision.  Differential includes diabetes related, glaucoma, aneurysmal, stroke, lens related, other.  Bedside Tono-Pen revealed elevated pressure 43 in the right and 13 in the left no baseline to compare. Neuro consult to help guide neuroimaging.  MRI per neuro for now, blood work pending.  Patient will be signed out to follow-up proximal, follow-up results and reassess. Consult optho after MRI results.    Final Clinical Impression(s) / ED Diagnoses Final diagnoses:  Acute loss of vision, bilateral    Rx / DC Orders ED Discharge Orders     None        Elnora Morrison, MD 12/19/20 0010

## 2020-12-18 NOTE — ED Triage Notes (Signed)
Pt bib ems from [place] at [facility] d/t eye pain and going blind. Pt states right eye is completely black and left eye is blurry. He states this has been going on for 2-3 weeks. Pt has a hx of DM. Pt states he had a flexi tube that was removed 3 days ago. Pt complains about bedsores on his bottom.

## 2020-12-18 NOTE — ED Notes (Signed)
EDP at the bedside.  ?

## 2020-12-19 ENCOUNTER — Other Ambulatory Visit: Payer: Self-pay

## 2020-12-19 ENCOUNTER — Emergency Department (HOSPITAL_COMMUNITY)
Admission: EM | Admit: 2020-12-19 | Discharge: 2020-12-20 | Disposition: A | Discharge status: Home / Self Care | Payer: Medicare HMO | Source: Home / Self Care | Attending: Emergency Medicine | Admitting: Emergency Medicine

## 2020-12-19 ENCOUNTER — Emergency Department (HOSPITAL_COMMUNITY): Payer: Medicare HMO

## 2020-12-19 DIAGNOSIS — Z794 Long term (current) use of insulin: Secondary | ICD-10-CM | POA: Insufficient documentation

## 2020-12-19 DIAGNOSIS — H547 Unspecified visual loss: Secondary | ICD-10-CM | POA: Diagnosis not present

## 2020-12-19 DIAGNOSIS — Z743 Need for continuous supervision: Secondary | ICD-10-CM | POA: Diagnosis not present

## 2020-12-19 DIAGNOSIS — Z79899 Other long term (current) drug therapy: Secondary | ICD-10-CM | POA: Insufficient documentation

## 2020-12-19 DIAGNOSIS — R531 Weakness: Secondary | ICD-10-CM | POA: Diagnosis not present

## 2020-12-19 DIAGNOSIS — H5461 Unqualified visual loss, right eye, normal vision left eye: Secondary | ICD-10-CM | POA: Diagnosis not present

## 2020-12-19 DIAGNOSIS — E1122 Type 2 diabetes mellitus with diabetic chronic kidney disease: Secondary | ICD-10-CM | POA: Insufficient documentation

## 2020-12-19 DIAGNOSIS — Z20822 Contact with and (suspected) exposure to covid-19: Secondary | ICD-10-CM | POA: Insufficient documentation

## 2020-12-19 DIAGNOSIS — Z7982 Long term (current) use of aspirin: Secondary | ICD-10-CM | POA: Insufficient documentation

## 2020-12-19 DIAGNOSIS — H538 Other visual disturbances: Secondary | ICD-10-CM | POA: Diagnosis not present

## 2020-12-19 DIAGNOSIS — G319 Degenerative disease of nervous system, unspecified: Secondary | ICD-10-CM | POA: Diagnosis not present

## 2020-12-19 DIAGNOSIS — H53131 Sudden visual loss, right eye: Secondary | ICD-10-CM | POA: Diagnosis not present

## 2020-12-19 DIAGNOSIS — I130 Hypertensive heart and chronic kidney disease with heart failure and stage 1 through stage 4 chronic kidney disease, or unspecified chronic kidney disease: Secondary | ICD-10-CM | POA: Insufficient documentation

## 2020-12-19 DIAGNOSIS — R443 Hallucinations, unspecified: Secondary | ICD-10-CM

## 2020-12-19 DIAGNOSIS — N1831 Chronic kidney disease, stage 3a: Secondary | ICD-10-CM | POA: Insufficient documentation

## 2020-12-19 DIAGNOSIS — Z87891 Personal history of nicotine dependence: Secondary | ICD-10-CM | POA: Insufficient documentation

## 2020-12-19 DIAGNOSIS — R69 Illness, unspecified: Secondary | ICD-10-CM | POA: Diagnosis not present

## 2020-12-19 DIAGNOSIS — R442 Other hallucinations: Secondary | ICD-10-CM | POA: Diagnosis not present

## 2020-12-19 DIAGNOSIS — H5710 Ocular pain, unspecified eye: Secondary | ICD-10-CM | POA: Diagnosis not present

## 2020-12-19 DIAGNOSIS — F331 Major depressive disorder, recurrent, moderate: Secondary | ICD-10-CM | POA: Insufficient documentation

## 2020-12-19 DIAGNOSIS — I5033 Acute on chronic diastolic (congestive) heart failure: Secondary | ICD-10-CM | POA: Insufficient documentation

## 2020-12-19 DIAGNOSIS — Z7401 Bed confinement status: Secondary | ICD-10-CM | POA: Diagnosis not present

## 2020-12-19 LAB — CBC WITH DIFFERENTIAL/PLATELET
Abs Immature Granulocytes: 0.06 10*3/uL (ref 0.00–0.07)
Basophils Absolute: 0 10*3/uL (ref 0.0–0.1)
Basophils Relative: 1 %
Eosinophils Absolute: 0.2 10*3/uL (ref 0.0–0.5)
Eosinophils Relative: 6 %
HCT: 28.4 % — ABNORMAL LOW (ref 39.0–52.0)
Hemoglobin: 8.9 g/dL — ABNORMAL LOW (ref 13.0–17.0)
Immature Granulocytes: 2 %
Lymphocytes Relative: 30 %
Lymphs Abs: 1.2 10*3/uL (ref 0.7–4.0)
MCH: 24.7 pg — ABNORMAL LOW (ref 26.0–34.0)
MCHC: 31.3 g/dL (ref 30.0–36.0)
MCV: 78.9 fL — ABNORMAL LOW (ref 80.0–100.0)
Monocytes Absolute: 0.5 10*3/uL (ref 0.1–1.0)
Monocytes Relative: 14 %
Neutro Abs: 1.8 10*3/uL (ref 1.7–7.7)
Neutrophils Relative %: 47 %
Platelets: 315 10*3/uL (ref 150–400)
RBC: 3.6 MIL/uL — ABNORMAL LOW (ref 4.22–5.81)
RDW: 15.2 % (ref 11.5–15.5)
WBC: 3.8 10*3/uL — ABNORMAL LOW (ref 4.0–10.5)
nRBC: 0 % (ref 0.0–0.2)

## 2020-12-19 LAB — URINALYSIS, ROUTINE W REFLEX MICROSCOPIC
Bacteria, UA: NONE SEEN
Bilirubin Urine: NEGATIVE
Glucose, UA: >=500 mg/dL — AB
Ketones, ur: NEGATIVE mg/dL
Nitrite: NEGATIVE
Protein, ur: 100 mg/dL — AB
Specific Gravity, Urine: 1.007 (ref 1.005–1.030)
WBC, UA: >50 WBC/hpf — ABNORMAL HIGH (ref 0–5)
pH: 5 (ref 5.0–8.0)

## 2020-12-19 LAB — MAGNESIUM: Magnesium: 1.9 mg/dL (ref 1.7–2.4)

## 2020-12-19 LAB — RESP PANEL BY RT-PCR (FLU A&B, COVID) ARPGX2
Influenza A by PCR: NEGATIVE
Influenza B by PCR: NEGATIVE
SARS Coronavirus 2 by RT PCR: NEGATIVE

## 2020-12-19 LAB — BASIC METABOLIC PANEL
Anion gap: 10 (ref 5–15)
BUN: 32 mg/dL — ABNORMAL HIGH (ref 6–20)
CO2: 25 mmol/L (ref 22–32)
Calcium: 8.4 mg/dL — ABNORMAL LOW (ref 8.9–10.3)
Chloride: 99 mmol/L (ref 98–111)
Creatinine, Ser: 1.25 mg/dL — ABNORMAL HIGH (ref 0.61–1.24)
GFR, Estimated: >60 mL/min (ref >60)
Glucose, Bld: 143 mg/dL — ABNORMAL HIGH (ref 70–99)
Potassium: 3.9 mmol/L (ref 3.5–5.1)
Sodium: 134 mmol/L — ABNORMAL LOW (ref 135–145)

## 2020-12-19 MED ORDER — DORZOLAMIDE HCL 2 % OP SOLN: 1.0000 [drp] | Freq: Two times a day (BID) | OPHTHALMIC | 12 refills | Status: DC

## 2020-12-19 MED ORDER — LORAZEPAM 2 MG/ML IJ SOLN
1.0000 mg | Freq: Once | INTRAMUSCULAR | Status: AC
  Administered 2020-12-19: 1 mg via INTRAVENOUS
  Filled 2020-12-19: qty 1

## 2020-12-19 MED ORDER — BRIMONIDINE TARTRATE 0.15 % OP SOLN
1.0000 [drp] | Freq: Two times a day (BID) | OPHTHALMIC | Status: DC
  Administered 2020-12-19 – 2020-12-20 (×2): 1 [drp] via OPHTHALMIC
  Filled 2020-12-19: qty 5

## 2020-12-19 MED ORDER — TETRACAINE HCL 0.5 % OP SOLN
2.0000 [drp] | Freq: Once | OPHTHALMIC | Status: AC
  Administered 2020-12-19: 2 [drp] via OPHTHALMIC
  Filled 2020-12-19: qty 4

## 2020-12-19 MED ORDER — DORZOLAMIDE HCL 2 % OP SOLN
1.0000 [drp] | Freq: Two times a day (BID) | OPHTHALMIC | Status: DC
  Administered 2020-12-19 – 2020-12-20 (×2): 1 [drp] via OPHTHALMIC
  Filled 2020-12-19: qty 10

## 2020-12-19 MED ORDER — BRIMONIDINE TARTRATE 0.2 % OP SOLN: 1.0000 [drp] | Freq: Two times a day (BID) | OPHTHALMIC | 12 refills | Status: DC

## 2020-12-19 NOTE — ED Notes (Signed)
Consult call placed for call back to [place] from [phone]

## 2020-12-19 NOTE — ED Triage Notes (Signed)
12/19/2020 1845 Patient c/o blurred vision and hallucinations.

## 2020-12-19 NOTE — ED Provider Notes (Signed)
I see the patient in signout from Dr. Reather Converse, briefly the patient is a 60 year old male with a chief complaints of vision loss going on for the past 2 to 3 weeks.  Worse on the right than the left.  On my exam the patient does not endorse any visualization of light on the rides though does have some photophobia, can visualize fingers but is unable to count them describes them as blurry as with the left eye.  Pressure was 12 on the left and was 29 and then 35 on the right.  I discussed this with ophthalmology, Dr. Nancy Fetter.  With the patient having symptoms for greater than a couple weeks she recommended an urgent appointment in the office and would hold off on any medications prior to her her evaluation.   Roy Etienne, DO 12/19/20 0400
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
## 2020-12-19 NOTE — ED Notes (Signed)
Report given to Dieterich at [facility].

## 2020-12-19 NOTE — ED Notes (Signed)
[facility] called  

## 2020-12-19 NOTE — ED Provider Notes (Signed)
[name] DEPT Provider Note   CSN: [id number] Arrival date & time: 12/19/20  1745     History Chief Complaint  Patient presents with   Medical Clearance    [name] is a 60 y.o. male.  HPI  Patient is a 60 year old male with a history of CHF, depression, bipolar disorder, diabetes mellitus, stroke with right-sided deficits, hypertension, who presents to the emergency department due to vision loss.  Patient was initially seen last night for this.  Neurology was consulted and MRI was obtained of the brain and orbits.  Findings noted below:  IMPRESSION: 1. Technically limited exam due to extensive motion artifact. 2. No acute intracranial abnormality. No obvious abnormality about either orbit. 3. Generalized cerebral atrophy with moderate chronic microvascular ischemic disease, with multiple scattered remote lacunar infarcts about the bilateral basal ganglia, left thalamus, and pons.  Tonometry performed by 2 different providers last night.  Revealed elevated pressure of 43 in the right and 13 the left.  This was repeated showing 12 on the left and 29 and then 35 in the right.  Patient was discussed with Dr. Nancy Fetter who is on-call with ophthalmology and recommended an urgent appointment in their office and holding off on medications prior to her evaluation.  Patient states he is nonambulatory and was transported back to [place] where he resides.  He was then never taken to see ophthalmology.  States his symptoms have persisted today.  Denies any SI/HI but does new onset visual hallucinations.     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   Depression 11/18/2020   Acute renal failure superimposed on stage 3a chronic kidney disease ([place]) 11/18/2020   BPH (benign prostatic hyperplasia) 11/18/2020   AKI (acute kidney injury) ([place]) 08/22/2020   CVA ([place]) with residual right-sided weakness 08/22/2020   Pericardial effusion 08/22/2020   Acute on chronic diastolic CHF (congestive heart failure) ([place]) 08/22/2020   TIA (transient ischemic attack) 07/03/2020   Acute on chronic heart failure with preserved ejection fraction (HCC)    Pulmonary artery hypertension ([place]) 06/25/2020   CKD (chronic kidney disease) 06/25/2020   History of cerebrovascular accident (CVA) with residual deficit 06/25/2020   CKD stage IIIa ([place]) 03/30/2020   Morbid (severe) obesity due to excess calories ([place]) 03/30/2020   Malnutrition of moderate degree 01/25/2020   Palliative care by specialist    Goals of care, counseling/discussion    Nephrotic syndrome 01/06/2020   Anasarca 01/06/2020   Hypokalemia due to loss of potassium 01/04/2020   Generalized body aches 01/01/2020   Non-traumatic rhabdomyolysis 01/01/2020   Chronic diastolic CHF (congestive heart failure) ([place]) 01/01/2020   Microcytic anemia 01/01/2020   Hypokalemia 12/31/2019   Elevated CK    Diabetes mellitus type 2, uncontrolled    Pure hypercholesterolemia    Hyperglycemia 09/28/2017   Nonobstructive atherosclerosis of coronary artery 09/28/2017   MDD (major depressive disorder), recurrent episode, severe ([place]) 07/30/2017   Osteoarthritis 04/12/2015   Severe recurrent major depression with psychotic features ([place]) 01/04/2015   Substance-related disorder ([place]) 10/30/2014  Patient's noncompliance with other medical treatment and regimen 06/07/2014   Cocaine dependence, uncomplicated ([place]) 69/48/5462   Chronic pain 05/31/2014   Cocaine abuse ([place]) 05/31/2014   History of noncompliance with medical treatment 05/31/2014   Malingering 05/31/2014    Leukopenia 05/31/2014   Diabetic neuropathy associated with diabetes mellitus due to underlying condition ([place]) 05/31/2014   Diabetic neuropathy ([place]) 05/31/2014   Depression, major, recurrent, severe with psychosis ([place]) 08/29/2013   Suicidal ideations 08/28/2013   Uncontrolled diabetes mellitus 06/30/2013   Atypical chest pain 12/09/2011   Essential hypertension 12/09/2011   Episodic mood disorder ([place]) 05/13/2011   Past Surgical History:  Procedure Laterality Date   BOWEL DECOMPRESSION N/A 01/19/2020   Procedure: BOWEL DECOMPRESSION;  Surgeon: Otis Brace, MD;  Location: [facility];  Service: Gastroenterology;  Laterality: N/A;   BOWEL DECOMPRESSION N/A 11/19/2020   Procedure: BOWEL DECOMPRESSION;  Surgeon: Wilford Corner, MD;  Location: [facility];  Service: Endoscopy;  Laterality: N/A;   BOWEL DECOMPRESSION N/A 12/03/2020   Procedure: BOWEL DECOMPRESSION;  Surgeon: Ronnette Juniper, MD;  Location: [facility];  Service: Gastroenterology;  Laterality: N/A;   COLONOSCOPY WITH PROPOFOL N/A 01/19/2020   Procedure: COLONOSCOPY WITH PROPOFOL;  Surgeon: Otis Brace, MD;  Location: [facility];  Service: Gastroenterology;  Laterality: N/A;   COLONOSCOPY WITH PROPOFOL N/A 11/19/2020   Procedure: COLONOSCOPY WITH PROPOFOL;  Surgeon: Wilford Corner, MD;  Location: [facility];  Service: Endoscopy;  Laterality: N/A;   COLONOSCOPY WITH PROPOFOL N/A 12/03/2020   Procedure: COLONOSCOPY WITH PROPOFOL;  Surgeon: Ronnette Juniper, MD;  [facility];  Service: Gastroenterology;  Laterality: N/A;   KNEE SURGERY     b/l knees; pending total knee    RADIOLOGY WITH ANESTHESIA N/A 07/04/2020   Procedure: MRI WITH ANESTHESIA BRAIN WITHOUT CONTRAST AND HEAD WITHOUT CONTRAST;  Surgeon: Radiologist, Medication, MD;  Location: [facility];  Service: Radiology;  Laterality: N/A;       Family History  Problem Relation Age of Onset   Diabetes Other    Cancer Mother        uterus; deceased     Heart attack Mother 42   Hypertension Other    Heart attack Father 19   Stroke Sister        12/2012 age 38 y.o    Social History   Tobacco Use   Smoking status: Former   Smokeless tobacco: Never  Scientific laboratory technician Use: Never used  Substance Use Topics   Alcohol use: No   Drug use: Not Currently    Types: Cocaine    Comment: Last use 2019    Home Medications Prior to Admission medications   Medication Sig Start Date End Date Taking? Authorizing Provider  acetaminophen (TYLENOL) 650 MG CR tablet Take 650 mg by mouth every 4 (four) hours as needed for pain (general discomfort).    [provider]  amLODipine (NORVASC) 10 MG tablet Take 10 mg by mouth daily.    [provider]  aspirin 81 MG chewable tablet Chew 81 mg by mouth daily.    [provider]  atorvastatin (LIPITOR) 80 MG tablet Take 80 mg by mouth at bedtime.    [provider]  empagliflozin (JARDIANCE) 10 MG TABS tablet Take 1 tablet (10 mg total) by mouth daily. 09/23/20   Raiford Noble Latif, DO  ferrous sulfate 325 (65 FE) MG tablet Take 1 tablet (325 mg total) by mouth daily. 09/23/20   [name], [name], DO  insulin aspart (NOVOLOG) 100  UNIT/ML injection Inject 2-12 Units into the skin See admin instructions. Check and record blood sugar before meals and inject per ssi. 151-200 = 2 units  201-250 = 4 units  251-300 = 6 units  301-350 = 8 units  351-400 = 10 units  greater than 400 = 12 units and call MD. 09/07/20   Antonieta Pert, MD  insulin glargine (LANTUS) 100 UNIT/ML injection Inject 0.15 mLs (15 Units total) into the skin at bedtime. 09/22/20   [name], [name], DO  isosorbide-hydrALAZINE (BIDIL) 20-37.5 MG tablet Take 1 tablet by mouth 3 (three) times daily. 09/07/20   Antonieta Pert, MD  loperamide (IMODIUM) 2 MG capsule Take 1 capsule (2 mg total) by mouth every 6 (six) hours as needed for diarrhea or loose stools. 12/11/20   Dwyane Dee, MD  ondansetron (ZOFRAN) 4 MG  tablet Take 4 mg by mouth every 4 (four) hours as needed for nausea or vomiting.    [provider]  pantoprazole (PROTONIX) 40 MG tablet Take 40 mg by mouth daily.    [provider]  polyethylene glycol (MIRALAX / GLYCOLAX) 17 g packet Take 17 g by mouth daily as needed. Patient taking differently: Take 17 g by mouth daily as needed for mild constipation. 03/30/20   [name], NP  potassium chloride (KLOR-CON) 10 MEQ tablet Take 2 tablets (20 mEq total) by mouth daily. 12/11/20   Dwyane Dee, MD  pregabalin (LYRICA) 75 MG capsule Take 75 mg by mouth 2 (two) times daily.    [provider]  saccharomyces boulardii (FLORASTOR) 250 MG capsule Take 1 capsule (250 mg total) by mouth 2 (two) times daily. 09/07/20   Antonieta Pert, MD  sacubitril-valsartan (ENTRESTO) 49-51 MG Take 1 tablet by mouth 2 (two) times daily.    [provider]  senna-docusate (SENOKOT-S) 8.6-50 MG tablet Take 1 tablet by mouth daily.    [provider]  sertraline (ZOLOFT) 50 MG tablet Take 50 mg by mouth daily.    [provider]  tamsulosin (FLOMAX) 0.4 MG CAPS capsule Take 0.8 mg by mouth daily.    [provider]  torsemide (DEMADEX) 20 MG tablet Take 40 mg by mouth 2 (two) times daily.    [provider]  Vitamin D, Ergocalciferol, (DRISDOL) 1.25 MG (50000 UNIT) CAPS capsule Take 50,000 Units by mouth once.    [provider]    Allergies    Other  Review of Systems   Review of Systems  All other systems reviewed and are negative. Ten systems reviewed and are negative for acute change, except as noted in the HPI.   Physical Exam Updated Vital Signs There were no vitals taken for this visit.  Physical Exam Vitals and nursing note reviewed.  Constitutional:      General: He is not in acute distress.    Appearance: Normal appearance. He is not ill-appearing, toxic-appearing or diaphoretic.  HENT:     Head: Normocephalic and  atraumatic.     Right Ear: External ear normal.     Left Ear: External ear normal.     Nose: Nose normal.     Mouth/Throat:     Mouth: Mucous membranes are moist.     Pharynx: Oropharynx is clear. No oropharyngeal exudate or posterior oropharyngeal erythema.  Eyes:     Comments: Right pupil is 6 mm and fixed.  Left pupil is 3 mm and minimally responsive to light.  Unable to assess extraocular movements.  Patient notes blurred  vision in the left eye and complete vision loss right eye.  IOP in the right eye is 48 mmHg. IOP in the left eye is 20 mmHg.  Cardiovascular:     Rate and Rhythm: Normal rate and regular rhythm.     Pulses: Normal pulses.     Heart sounds: Normal heart sounds. No murmur heard.   No friction rub. No gallop.  Pulmonary:     Effort: Pulmonary effort is normal. No respiratory distress.     Breath sounds: Normal breath sounds. No stridor. No wheezing, rhonchi or rales.  Abdominal:     General: Abdomen is flat.     Tenderness: There is no abdominal tenderness.  Musculoskeletal:        General: Normal range of motion.     Cervical back: Normal range of motion and neck supple. No tenderness.  Skin:    General: Skin is warm and dry.  Neurological:     General: No focal deficit present.     Mental Status: He is alert and oriented to person, place, and time.  Psychiatric:        Mood and Affect: Mood normal.        Behavior: Behavior normal.    ED Results / Procedures / Treatments   Labs (all labs ordered are listed, but only abnormal results are displayed) Labs Reviewed - No data to display  EKG None  Radiology MR BRAIN WO CONTRAST  Result Date: 12/19/2020 CLINICAL DATA:  Initial evaluation for acute binocular visual loss, right greater than left. EXAM: MRI HEAD AND ORBITS WITHOUT CONTRAST TECHNIQUE: Multiplanar, multi-echo pulse sequences of the brain and surrounding structures were acquired without intravenous contrast. Multiplanar, multi-echo pulse  sequences of the orbits and surrounding structures were acquired including fat saturation techniques, without intravenous contrast administration. COMPARISON:  Prior CT from 07/02/2020. FINDINGS: MRI HEAD FINDINGS Brain: Examination moderately to severely degraded by motion artifact. Mild diffuse prominence of the CSF containing spaces compatible generalized cerebral atrophy. Patchy and confluent T2/FLAIR hyperintensity involving the periventricular deep white matter both cerebral hemispheres as well as the pons, consistent with chronic microvascular ischemic disease, moderate in nature. Few scatter remote lacunar infarcts present about the bilateral basal ganglia, left thalamus, and left pons. No abnormal foci of restricted diffusion to suggest acute or subacute ischemia. Gray-white matter differentiation maintained. No encephalomalacia to suggest chronic cortical infarction. No definite foci of susceptibility artifact to suggest acute or chronic intracranial hemorrhage on this motion degraded exam. No visible mass lesion, mass effect or midline shift. No hydrocephalus or extra-axial fluid collection. Pituitary gland suprasellar region normal. Midline structures intact. Vascular: Major intracranial vascular flow voids are maintained at the skull base. Left vertebral artery hypoplastic. Skull and upper cervical spine: Craniocervical junction within normal limits. Postsurgical changes partially visualize within the upper cervical spine. Other: No mastoid effusion.  Inner ear structures grossly normal. MRI ORBITS FINDINGS Orbits: Examination severely degraded by motion artifact, markedly limiting assessment. Globes are grossly symmetric with normal appearance bilaterally. No obvious abnormality on this motion degraded exam. Optic nerves grossly symmetric without visible abnormality. No visible abnormality about the orbital apices or cavernous sinus. Optic chiasm normally situated within the suprasellar cistern on  corresponding brain MRI. Visualized optic radiations grossly normal. No visible abnormality about the cavernous sinus. Superior orbital veins grossly symmetric and within normal limits. Intraconal and extraconal fat maintained. Extra-ocular muscles symmetric and within normal limits. Lacrimal glands within normal limits. No acute abnormality. Visualized sinuses: Mild scattered mucosal thickening  noted within the sphenoid ethmoidal sinuses. Partially visualized paranasal sinuses are otherwise grossly clear. Soft tissues: Grossly unremarkable. IMPRESSION: 1. Technically limited exam due to extensive motion artifact. 2. No acute intracranial abnormality. No obvious abnormality about either orbit. 3. Generalized cerebral atrophy with moderate chronic microvascular ischemic disease, with multiple scattered remote lacunar infarcts about the bilateral basal ganglia, left thalamus, and pons. Electronically Signed   By: Jeannine Boga M.D.   On: 12/19/2020 03:38   MR ORBITS WO CONTRAST  Result Date: 12/19/2020 CLINICAL DATA:  Initial evaluation for acute binocular visual loss, right greater than left. EXAM: MRI HEAD AND ORBITS WITHOUT CONTRAST TECHNIQUE: Multiplanar, multi-echo pulse sequences of the brain and surrounding structures were acquired without intravenous contrast. Multiplanar, multi-echo pulse sequences of the orbits and surrounding structures were acquired including fat saturation techniques, without intravenous contrast administration. COMPARISON:  Prior CT from 07/02/2020. FINDINGS: MRI HEAD FINDINGS Brain: Examination moderately to severely degraded by motion artifact. Mild diffuse prominence of the CSF containing spaces compatible generalized cerebral atrophy. Patchy and confluent T2/FLAIR hyperintensity involving the periventricular deep white matter both cerebral hemispheres as well as the pons, consistent with chronic microvascular ischemic disease, moderate in nature. Few scatter remote  lacunar infarcts present about the bilateral basal ganglia, left thalamus, and left pons. No abnormal foci of restricted diffusion to suggest acute or subacute ischemia. Gray-white matter differentiation maintained. No encephalomalacia to suggest chronic cortical infarction. No definite foci of susceptibility artifact to suggest acute or chronic intracranial hemorrhage on this motion degraded exam. No visible mass lesion, mass effect or midline shift. No hydrocephalus or extra-axial fluid collection. Pituitary gland suprasellar region normal. Midline structures intact. Vascular: Major intracranial vascular flow voids are maintained at the skull base. Left vertebral artery hypoplastic. Skull and upper cervical spine: Craniocervical junction within normal limits. Postsurgical changes partially visualize within the upper cervical spine. Other: No mastoid effusion.  Inner ear structures grossly normal. MRI ORBITS FINDINGS Orbits: Examination severely degraded by motion artifact, markedly limiting assessment. Globes are grossly symmetric with normal appearance bilaterally. No obvious abnormality on this motion degraded exam. Optic nerves grossly symmetric without visible abnormality. No visible abnormality about the orbital apices or cavernous sinus. Optic chiasm normally situated within the suprasellar cistern on corresponding brain MRI. Visualized optic radiations grossly normal. No visible abnormality about the cavernous sinus. Superior orbital veins grossly symmetric and within normal limits. Intraconal and extraconal fat maintained. Extra-ocular muscles symmetric and within normal limits. Lacrimal glands within normal limits. No acute abnormality. Visualized sinuses: Mild scattered mucosal thickening noted within the sphenoid ethmoidal sinuses. Partially visualized paranasal sinuses are otherwise grossly clear. Soft tissues: Grossly unremarkable. IMPRESSION: 1. Technically limited exam due to extensive motion  artifact. 2. No acute intracranial abnormality. No obvious abnormality about either orbit. 3. Generalized cerebral atrophy with moderate chronic microvascular ischemic disease, with multiple scattered remote lacunar infarcts about the bilateral basal ganglia, left thalamus, and pons. Electronically Signed   By: Jeannine Boga M.D.   On: 12/19/2020 03:38    Procedures Procedures   Medications Ordered in ED Medications  brimonidine (ALPHAGAN) 0.15 % ophthalmic solution 1 drop (has no administration in time range)  dorzolamide (TRUSOPT) 2 % ophthalmic solution 1 drop (has no administration in time range)  tetracaine (PONTOCAINE) 0.5 % ophthalmic solution 2 drop (2 drops Both Eyes Given 12/19/20 1903)    ED Course  I have reviewed the triage vital signs and the nursing notes.  Pertinent labs & imaging results that were available during my  care of the patient were reviewed by me and considered in my medical decision making (see chart for details).  Clinical Course as of 12/19/20 2002  Tue Dec 19, 2020  1950 I spoke to Dr. Nancy Fetter with ophthalmology.  She was expecting to see the patient today after he was discharged this morning but his SNF facility never reached out to her or attempted to bring the patient to her office.  Based on my findings today she recommends that we start brimonidine as well as dorzolamide twice daily.  She states it is safe to use in both eyes.  Still recommends outpatient follow-up.   [LJ]    Clinical Course User Index [LJ] Roy Sexton, PA-C   MDM Rules/Calculators/A&P                          Pt is a 60 y.o. male who presents to the emergency department due to progressive vision loss as well as hallucinations.  Patient seen earlier today and discharged back to [facility] and recommended follow-up with ophthalmology.  [facility] apparently did not follow-up with ophthalmology and patient has come back to the emergency department with same complaint of  vision loss.  Now notes hallucinations.    Physical exam significant for a fixed dilated right pupil.  Increased IOP at 48 mmHg.  20 mmHg on the left.  Patient discussed again with Dr. Nancy Fetter who is on-call for ophthalmology.  Recommends patient be started on brimonidine as well as dorzolamide twice per day.  States it is safe to use in both eyes.  Still recommends outpatient follow-up.    Lab work obtained at his visit this morning.  Appears to be similar to his baseline values.  Feel the patient is medically cleared at this time.  Given his new onset hallucinations will have TTS evaluate the patient.  Disposition pending based on TTS recommendations.  Note: Portions of this report may have been transcribed using voice recognition software. Every effort was made to ensure accuracy; however, inadvertent computerized transcription errors may be present.    Final Clinical Impression(s) / ED Diagnoses Final diagnoses:  Vision loss  Hallucinations   Rx / DC Orders ED Discharge Orders     None        [name], [name] 12/19/20 2004    [name] Rasmussen, MD 12/20/20 1119

## 2020-12-19 NOTE — Discharge Instructions (Signed)
Please call the ophthalmology office this morning to try and schedule an urgent appointment so that your vision can be evaluated.

## 2020-12-19 NOTE — ED Notes (Signed)
Pt returned from MRI °

## 2020-12-19 NOTE — Discharge Instructions (Addendum)
Please reach out to Dr. Nancy Fetter who is a local ophthalmologist.  She needs to evaluate his eye as soon as possible. Her contact information is below.  He needs to be given two new eyedrops.  1 is called brimonidine and the other is called dorzolamide.  These can be placed in both eyes twice per day.  Please bring him back to the emergency department with any new or worsening symptoms.

## 2020-12-20 DIAGNOSIS — R52 Pain, unspecified: Secondary | ICD-10-CM | POA: Diagnosis not present

## 2020-12-20 DIAGNOSIS — E1142 Type 2 diabetes mellitus with diabetic polyneuropathy: Secondary | ICD-10-CM | POA: Diagnosis present

## 2020-12-20 DIAGNOSIS — H53131 Sudden visual loss, right eye: Secondary | ICD-10-CM | POA: Diagnosis not present

## 2020-12-20 DIAGNOSIS — I959 Hypotension, unspecified: Secondary | ICD-10-CM | POA: Diagnosis not present

## 2020-12-20 DIAGNOSIS — E11622 Type 2 diabetes mellitus with other skin ulcer: Secondary | ICD-10-CM | POA: Diagnosis not present

## 2020-12-20 DIAGNOSIS — R1312 Dysphagia, oropharyngeal phase: Secondary | ICD-10-CM | POA: Diagnosis not present

## 2020-12-20 DIAGNOSIS — E118 Type 2 diabetes mellitus with unspecified complications: Secondary | ICD-10-CM | POA: Diagnosis not present

## 2020-12-20 DIAGNOSIS — E440 Moderate protein-calorie malnutrition: Secondary | ICD-10-CM | POA: Diagnosis present

## 2020-12-20 DIAGNOSIS — M86172 Other acute osteomyelitis, left ankle and foot: Secondary | ICD-10-CM | POA: Diagnosis present

## 2020-12-20 DIAGNOSIS — Z8659 Personal history of other mental and behavioral disorders: Secondary | ICD-10-CM | POA: Diagnosis not present

## 2020-12-20 DIAGNOSIS — M6281 Muscle weakness (generalized): Secondary | ICD-10-CM | POA: Diagnosis not present

## 2020-12-20 DIAGNOSIS — Z7189 Other specified counseling: Secondary | ICD-10-CM | POA: Diagnosis not present

## 2020-12-20 DIAGNOSIS — I5030 Unspecified diastolic (congestive) heart failure: Secondary | ICD-10-CM | POA: Diagnosis not present

## 2020-12-20 DIAGNOSIS — W19XXXA Unspecified fall, initial encounter: Secondary | ICD-10-CM | POA: Diagnosis not present

## 2020-12-20 DIAGNOSIS — I10 Essential (primary) hypertension: Secondary | ICD-10-CM | POA: Diagnosis not present

## 2020-12-20 DIAGNOSIS — R0789 Other chest pain: Secondary | ICD-10-CM | POA: Diagnosis not present

## 2020-12-20 DIAGNOSIS — Z20822 Contact with and (suspected) exposure to covid-19: Secondary | ICD-10-CM | POA: Diagnosis present

## 2020-12-20 DIAGNOSIS — Z743 Need for continuous supervision: Secondary | ICD-10-CM | POA: Diagnosis not present

## 2020-12-20 DIAGNOSIS — D508 Other iron deficiency anemias: Secondary | ICD-10-CM | POA: Diagnosis not present

## 2020-12-20 DIAGNOSIS — Z7401 Bed confinement status: Secondary | ICD-10-CM | POA: Diagnosis not present

## 2020-12-20 DIAGNOSIS — E876 Hypokalemia: Secondary | ICD-10-CM | POA: Diagnosis not present

## 2020-12-20 DIAGNOSIS — H409 Unspecified glaucoma: Secondary | ICD-10-CM | POA: Diagnosis not present

## 2020-12-20 DIAGNOSIS — M25511 Pain in right shoulder: Secondary | ICD-10-CM | POA: Diagnosis present

## 2020-12-20 DIAGNOSIS — R41841 Cognitive communication deficit: Secondary | ICD-10-CM | POA: Diagnosis not present

## 2020-12-20 DIAGNOSIS — Z79899 Other long term (current) drug therapy: Secondary | ICD-10-CM | POA: Diagnosis not present

## 2020-12-20 DIAGNOSIS — M19072 Primary osteoarthritis, left ankle and foot: Secondary | ICD-10-CM | POA: Diagnosis not present

## 2020-12-20 DIAGNOSIS — G8929 Other chronic pain: Secondary | ICD-10-CM | POA: Diagnosis not present

## 2020-12-20 DIAGNOSIS — M19011 Primary osteoarthritis, right shoulder: Secondary | ICD-10-CM | POA: Diagnosis not present

## 2020-12-20 DIAGNOSIS — N183 Chronic kidney disease, stage 3 unspecified: Secondary | ICD-10-CM | POA: Diagnosis not present

## 2020-12-20 DIAGNOSIS — E1169 Type 2 diabetes mellitus with other specified complication: Secondary | ICD-10-CM | POA: Diagnosis present

## 2020-12-20 DIAGNOSIS — L97528 Non-pressure chronic ulcer of other part of left foot with other specified severity: Secondary | ICD-10-CM | POA: Diagnosis present

## 2020-12-20 DIAGNOSIS — E669 Obesity, unspecified: Secondary | ICD-10-CM | POA: Diagnosis present

## 2020-12-20 DIAGNOSIS — M25531 Pain in right wrist: Secondary | ICD-10-CM | POA: Diagnosis not present

## 2020-12-20 DIAGNOSIS — Z794 Long term (current) use of insulin: Secondary | ICD-10-CM | POA: Diagnosis not present

## 2020-12-20 DIAGNOSIS — Z7984 Long term (current) use of oral hypoglycemic drugs: Secondary | ICD-10-CM | POA: Diagnosis not present

## 2020-12-20 DIAGNOSIS — H543 Unqualified visual loss, both eyes: Secondary | ICD-10-CM | POA: Diagnosis not present

## 2020-12-20 DIAGNOSIS — I130 Hypertensive heart and chronic kidney disease with heart failure and stage 1 through stage 4 chronic kidney disease, or unspecified chronic kidney disease: Secondary | ICD-10-CM | POA: Diagnosis present

## 2020-12-20 DIAGNOSIS — R69 Illness, unspecified: Secondary | ICD-10-CM | POA: Diagnosis not present

## 2020-12-20 DIAGNOSIS — E11621 Type 2 diabetes mellitus with foot ulcer: Secondary | ICD-10-CM | POA: Diagnosis present

## 2020-12-20 DIAGNOSIS — R2689 Other abnormalities of gait and mobility: Secondary | ICD-10-CM | POA: Diagnosis not present

## 2020-12-20 DIAGNOSIS — L089 Local infection of the skin and subcutaneous tissue, unspecified: Secondary | ICD-10-CM | POA: Diagnosis not present

## 2020-12-20 DIAGNOSIS — E1140 Type 2 diabetes mellitus with diabetic neuropathy, unspecified: Secondary | ICD-10-CM | POA: Diagnosis not present

## 2020-12-20 DIAGNOSIS — E8721 Acute metabolic acidosis: Secondary | ICD-10-CM | POA: Diagnosis present

## 2020-12-20 DIAGNOSIS — N179 Acute kidney failure, unspecified: Secondary | ICD-10-CM | POA: Diagnosis not present

## 2020-12-20 DIAGNOSIS — H5710 Ocular pain, unspecified eye: Secondary | ICD-10-CM | POA: Diagnosis not present

## 2020-12-20 DIAGNOSIS — F319 Bipolar disorder, unspecified: Secondary | ICD-10-CM | POA: Diagnosis present

## 2020-12-20 DIAGNOSIS — E43 Unspecified severe protein-calorie malnutrition: Secondary | ICD-10-CM | POA: Diagnosis not present

## 2020-12-20 DIAGNOSIS — N1831 Chronic kidney disease, stage 3a: Secondary | ICD-10-CM | POA: Diagnosis not present

## 2020-12-20 DIAGNOSIS — M7732 Calcaneal spur, left foot: Secondary | ICD-10-CM | POA: Diagnosis not present

## 2020-12-20 DIAGNOSIS — E871 Hypo-osmolality and hyponatremia: Secondary | ICD-10-CM | POA: Diagnosis present

## 2020-12-20 DIAGNOSIS — M25519 Pain in unspecified shoulder: Secondary | ICD-10-CM | POA: Diagnosis not present

## 2020-12-20 DIAGNOSIS — S91302A Unspecified open wound, left foot, initial encounter: Secondary | ICD-10-CM | POA: Diagnosis not present

## 2020-12-20 DIAGNOSIS — M7989 Other specified soft tissue disorders: Secondary | ICD-10-CM | POA: Diagnosis not present

## 2020-12-20 DIAGNOSIS — H40051 Ocular hypertension, right eye: Secondary | ICD-10-CM | POA: Diagnosis not present

## 2020-12-20 DIAGNOSIS — R739 Hyperglycemia, unspecified: Secondary | ICD-10-CM | POA: Diagnosis not present

## 2020-12-20 DIAGNOSIS — R531 Weakness: Secondary | ICD-10-CM | POA: Diagnosis not present

## 2020-12-20 DIAGNOSIS — R21 Rash and other nonspecific skin eruption: Secondary | ICD-10-CM | POA: Diagnosis not present

## 2020-12-20 DIAGNOSIS — R5381 Other malaise: Secondary | ICD-10-CM | POA: Diagnosis not present

## 2020-12-20 DIAGNOSIS — D631 Anemia in chronic kidney disease: Secondary | ICD-10-CM | POA: Diagnosis present

## 2020-12-20 DIAGNOSIS — I69351 Hemiplegia and hemiparesis following cerebral infarction affecting right dominant side: Secondary | ICD-10-CM | POA: Diagnosis not present

## 2020-12-20 DIAGNOSIS — R269 Unspecified abnormalities of gait and mobility: Secondary | ICD-10-CM | POA: Diagnosis not present

## 2020-12-20 DIAGNOSIS — E1152 Type 2 diabetes mellitus with diabetic peripheral angiopathy with gangrene: Secondary | ICD-10-CM | POA: Diagnosis present

## 2020-12-20 DIAGNOSIS — D509 Iron deficiency anemia, unspecified: Secondary | ICD-10-CM | POA: Diagnosis not present

## 2020-12-20 DIAGNOSIS — F05 Delirium due to known physiological condition: Secondary | ICD-10-CM | POA: Diagnosis not present

## 2020-12-20 DIAGNOSIS — Z515 Encounter for palliative care: Secondary | ICD-10-CM | POA: Diagnosis not present

## 2020-12-20 DIAGNOSIS — E1122 Type 2 diabetes mellitus with diabetic chronic kidney disease: Secondary | ICD-10-CM | POA: Diagnosis present

## 2020-12-20 DIAGNOSIS — K5981 Ogilvie syndrome: Secondary | ICD-10-CM | POA: Diagnosis not present

## 2020-12-20 DIAGNOSIS — I70262 Atherosclerosis of native arteries of extremities with gangrene, left leg: Secondary | ICD-10-CM | POA: Diagnosis present

## 2020-12-20 DIAGNOSIS — R410 Disorientation, unspecified: Secondary | ICD-10-CM | POA: Diagnosis not present

## 2020-12-20 DIAGNOSIS — M79631 Pain in right forearm: Secondary | ICD-10-CM | POA: Diagnosis not present

## 2020-12-20 DIAGNOSIS — I5032 Chronic diastolic (congestive) heart failure: Secondary | ICD-10-CM | POA: Diagnosis not present

## 2020-12-20 DIAGNOSIS — Z87891 Personal history of nicotine dependence: Secondary | ICD-10-CM | POA: Diagnosis not present

## 2020-12-20 DIAGNOSIS — H538 Other visual disturbances: Secondary | ICD-10-CM | POA: Diagnosis not present

## 2020-12-20 DIAGNOSIS — E119 Type 2 diabetes mellitus without complications: Secondary | ICD-10-CM | POA: Diagnosis not present

## 2020-12-20 DIAGNOSIS — M25521 Pain in right elbow: Secondary | ICD-10-CM | POA: Diagnosis not present

## 2020-12-20 DIAGNOSIS — E1165 Type 2 diabetes mellitus with hyperglycemia: Secondary | ICD-10-CM | POA: Diagnosis not present

## 2020-12-20 DIAGNOSIS — I2721 Secondary pulmonary arterial hypertension: Secondary | ICD-10-CM | POA: Diagnosis present

## 2020-12-20 DIAGNOSIS — Z7982 Long term (current) use of aspirin: Secondary | ICD-10-CM | POA: Diagnosis not present

## 2020-12-20 DIAGNOSIS — E11628 Type 2 diabetes mellitus with other skin complications: Secondary | ICD-10-CM | POA: Diagnosis not present

## 2020-12-20 DIAGNOSIS — I5033 Acute on chronic diastolic (congestive) heart failure: Secondary | ICD-10-CM | POA: Diagnosis not present

## 2020-12-20 DIAGNOSIS — M19041 Primary osteoarthritis, right hand: Secondary | ICD-10-CM | POA: Diagnosis not present

## 2020-12-20 DIAGNOSIS — I739 Peripheral vascular disease, unspecified: Secondary | ICD-10-CM | POA: Diagnosis not present

## 2020-12-20 DIAGNOSIS — M79673 Pain in unspecified foot: Secondary | ICD-10-CM | POA: Diagnosis not present

## 2020-12-20 DIAGNOSIS — I2510 Atherosclerotic heart disease of native coronary artery without angina pectoris: Secondary | ICD-10-CM | POA: Diagnosis not present

## 2020-12-20 DIAGNOSIS — I509 Heart failure, unspecified: Secondary | ICD-10-CM | POA: Diagnosis not present

## 2020-12-20 DIAGNOSIS — I6782 Cerebral ischemia: Secondary | ICD-10-CM | POA: Diagnosis not present

## 2020-12-20 MED ORDER — DORZOLAMIDE HCL 2 % OP SOLN: 1.0000 [drp] | Freq: Two times a day (BID) | OPHTHALMIC | 12 refills | Status: DC

## 2020-12-20 MED ORDER — ONDANSETRON 4 MG PO TBDP
4.0000 mg | ORAL_TABLET | Freq: Once | ORAL | Status: AC
  Administered 2020-12-20: 4 mg via ORAL
  Filled 2020-12-20: qty 1

## 2020-12-20 MED ORDER — BRIMONIDINE TARTRATE 0.2 % OP SOLN: 1.0000 [drp] | Freq: Two times a day (BID) | OPHTHALMIC | 12 refills | Status: DC

## 2020-12-20 NOTE — ED Notes (Signed)
Attempted report to [facility]. No answer

## 2020-12-20 NOTE — ED Notes (Signed)
[facility] called  

## 2020-12-20 NOTE — Consult Note (Signed)
  Patient seen by this psychiatric nurse practitioner, in which he replies " I am here for my eyes. I am going blind." TTS consult was placed for hallucinations that are likely secondary to his visual distortions and blurred vision. Patient appears to become visibly frustrated when he is advised he is going to be returning back to [place] after he is medically stable. He appears to be apologetic and states " I am sorry for wasting your time. But if you want to send me back to a place I don't want to be then ok. " Patient minimizes his suicidal thoughts and or suicidal statements he made last night. He denies SI/HI/AVH.   At this time will psychiatrically clear this patient to return back to his facility. Recommend follow up with care coordination and or Medicaid to begin assistance with facility placement. -Patient has previous outpatient resources from his previous admissions. Will not provide new information at this time.

## 2020-12-20 NOTE — ED Notes (Signed)
TTS Consult in process at this time

## 2020-12-20 NOTE — ED Notes (Signed)
Attempted report to [place], had to leave message.

## 2020-12-20 NOTE — BH Assessment (Signed)
[facility] Assessment Progress Note   Per Sheran Fava, NP, this voluntary pt does not require psychiatric hospitalization at this time.  Pt is psychiatrically cleared.  No behavioral health referrals are indicated for pt at this time.  EDP Blanchie Dessert, MD and pt's nurse, I-Li, have been notified.  Jalene Mullet, [place] Triage Specialist [phone]

## 2020-12-20 NOTE — ED Provider Notes (Signed)
Pt was psychiatrically cleared this morning.  Pt was found to have elevated IOP over the last few days in the ER and Dr. Nancy Fetter was consulted who recommended outpt follow up and eye drops.  Pt resides at [facility] and will need to be seen in the next day by ophthamology.  Pt has been receiving alphagan and trusopt since yesterday and need to continue until seeing ophthamology.   Blanchie Dessert, MD 12/20/20 1055

## 2020-12-21 DIAGNOSIS — E876 Hypokalemia: Secondary | ICD-10-CM | POA: Diagnosis not present

## 2020-12-21 DIAGNOSIS — K5981 Ogilvie syndrome: Secondary | ICD-10-CM | POA: Diagnosis not present

## 2020-12-21 DIAGNOSIS — H543 Unqualified visual loss, both eyes: Secondary | ICD-10-CM | POA: Diagnosis not present

## 2020-12-21 DIAGNOSIS — N179 Acute kidney failure, unspecified: Secondary | ICD-10-CM | POA: Diagnosis not present

## 2020-12-22 DIAGNOSIS — I10 Essential (primary) hypertension: Secondary | ICD-10-CM | POA: Diagnosis not present

## 2020-12-22 DIAGNOSIS — E1140 Type 2 diabetes mellitus with diabetic neuropathy, unspecified: Secondary | ICD-10-CM | POA: Diagnosis not present

## 2020-12-22 DIAGNOSIS — I5030 Unspecified diastolic (congestive) heart failure: Secondary | ICD-10-CM | POA: Diagnosis not present

## 2020-12-22 DIAGNOSIS — E119 Type 2 diabetes mellitus without complications: Secondary | ICD-10-CM | POA: Diagnosis not present

## 2020-12-25 DIAGNOSIS — E118 Type 2 diabetes mellitus with unspecified complications: Secondary | ICD-10-CM | POA: Diagnosis not present

## 2020-12-25 DIAGNOSIS — E876 Hypokalemia: Secondary | ICD-10-CM | POA: Diagnosis not present

## 2020-12-26 DIAGNOSIS — M19041 Primary osteoarthritis, right hand: Secondary | ICD-10-CM | POA: Diagnosis not present

## 2020-12-26 DIAGNOSIS — E119 Type 2 diabetes mellitus without complications: Secondary | ICD-10-CM | POA: Diagnosis not present

## 2020-12-26 DIAGNOSIS — I2510 Atherosclerotic heart disease of native coronary artery without angina pectoris: Secondary | ICD-10-CM | POA: Diagnosis not present

## 2020-12-26 DIAGNOSIS — I5030 Unspecified diastolic (congestive) heart failure: Secondary | ICD-10-CM | POA: Diagnosis not present

## 2020-12-26 DIAGNOSIS — D509 Iron deficiency anemia, unspecified: Secondary | ICD-10-CM | POA: Diagnosis not present

## 2020-12-26 DIAGNOSIS — R41841 Cognitive communication deficit: Secondary | ICD-10-CM | POA: Diagnosis not present

## 2020-12-26 DIAGNOSIS — R1312 Dysphagia, oropharyngeal phase: Secondary | ICD-10-CM | POA: Diagnosis not present

## 2020-12-26 DIAGNOSIS — R2689 Other abnormalities of gait and mobility: Secondary | ICD-10-CM | POA: Diagnosis not present

## 2020-12-26 DIAGNOSIS — E876 Hypokalemia: Secondary | ICD-10-CM | POA: Diagnosis not present

## 2020-12-26 DIAGNOSIS — M6281 Muscle weakness (generalized): Secondary | ICD-10-CM | POA: Diagnosis not present

## 2020-12-26 DIAGNOSIS — N1831 Chronic kidney disease, stage 3a: Secondary | ICD-10-CM | POA: Diagnosis not present

## 2020-12-26 DIAGNOSIS — D508 Other iron deficiency anemias: Secondary | ICD-10-CM | POA: Diagnosis not present

## 2020-12-26 DIAGNOSIS — I10 Essential (primary) hypertension: Secondary | ICD-10-CM | POA: Diagnosis not present

## 2020-12-26 DIAGNOSIS — K5981 Ogilvie syndrome: Secondary | ICD-10-CM | POA: Diagnosis not present

## 2021-01-03 ENCOUNTER — Encounter (HOSPITAL_COMMUNITY): Payer: Self-pay | Admitting: Radiology

## 2021-01-08 DIAGNOSIS — M19041 Primary osteoarthritis, right hand: Secondary | ICD-10-CM | POA: Diagnosis not present

## 2021-01-09 DIAGNOSIS — M19041 Primary osteoarthritis, right hand: Secondary | ICD-10-CM | POA: Diagnosis not present

## 2021-01-19 DIAGNOSIS — I10 Essential (primary) hypertension: Secondary | ICD-10-CM | POA: Diagnosis not present

## 2021-01-19 DIAGNOSIS — D508 Other iron deficiency anemias: Secondary | ICD-10-CM | POA: Diagnosis not present

## 2021-01-19 DIAGNOSIS — I5030 Unspecified diastolic (congestive) heart failure: Secondary | ICD-10-CM | POA: Diagnosis not present

## 2021-01-19 DIAGNOSIS — E119 Type 2 diabetes mellitus without complications: Secondary | ICD-10-CM | POA: Diagnosis not present

## 2021-01-25 DIAGNOSIS — E119 Type 2 diabetes mellitus without complications: Secondary | ICD-10-CM | POA: Diagnosis not present

## 2021-01-25 DIAGNOSIS — I10 Essential (primary) hypertension: Secondary | ICD-10-CM | POA: Diagnosis not present

## 2021-01-25 DIAGNOSIS — E1140 Type 2 diabetes mellitus with diabetic neuropathy, unspecified: Secondary | ICD-10-CM | POA: Diagnosis not present

## 2021-01-25 DIAGNOSIS — I5030 Unspecified diastolic (congestive) heart failure: Secondary | ICD-10-CM | POA: Diagnosis not present

## 2021-02-02 ENCOUNTER — Emergency Department (HOSPITAL_COMMUNITY): Payer: Medicare HMO

## 2021-02-02 ENCOUNTER — Encounter (HOSPITAL_COMMUNITY): Payer: Self-pay | Admitting: Emergency Medicine

## 2021-02-02 ENCOUNTER — Other Ambulatory Visit: Payer: Self-pay

## 2021-02-02 ENCOUNTER — Emergency Department (HOSPITAL_COMMUNITY)
Admission: EM | Admit: 2021-02-02 | Discharge: 2021-02-03 | Disposition: A | Discharge status: Home / Self Care | Payer: Medicare HMO | Attending: Emergency Medicine | Admitting: Emergency Medicine

## 2021-02-02 DIAGNOSIS — Z7982 Long term (current) use of aspirin: Secondary | ICD-10-CM | POA: Insufficient documentation

## 2021-02-02 DIAGNOSIS — M19011 Primary osteoarthritis, right shoulder: Secondary | ICD-10-CM | POA: Diagnosis not present

## 2021-02-02 DIAGNOSIS — M25511 Pain in right shoulder: Secondary | ICD-10-CM | POA: Diagnosis not present

## 2021-02-02 DIAGNOSIS — M7989 Other specified soft tissue disorders: Secondary | ICD-10-CM | POA: Diagnosis not present

## 2021-02-02 DIAGNOSIS — Z794 Long term (current) use of insulin: Secondary | ICD-10-CM | POA: Diagnosis not present

## 2021-02-02 DIAGNOSIS — W19XXXA Unspecified fall, initial encounter: Secondary | ICD-10-CM | POA: Diagnosis not present

## 2021-02-02 DIAGNOSIS — M25519 Pain in unspecified shoulder: Secondary | ICD-10-CM | POA: Diagnosis not present

## 2021-02-02 DIAGNOSIS — I130 Hypertensive heart and chronic kidney disease with heart failure and stage 1 through stage 4 chronic kidney disease, or unspecified chronic kidney disease: Secondary | ICD-10-CM | POA: Diagnosis not present

## 2021-02-02 DIAGNOSIS — M25531 Pain in right wrist: Secondary | ICD-10-CM | POA: Insufficient documentation

## 2021-02-02 DIAGNOSIS — Z7984 Long term (current) use of oral hypoglycemic drugs: Secondary | ICD-10-CM | POA: Insufficient documentation

## 2021-02-02 DIAGNOSIS — I5033 Acute on chronic diastolic (congestive) heart failure: Secondary | ICD-10-CM | POA: Insufficient documentation

## 2021-02-02 DIAGNOSIS — Z79899 Other long term (current) drug therapy: Secondary | ICD-10-CM | POA: Insufficient documentation

## 2021-02-02 DIAGNOSIS — M79631 Pain in right forearm: Secondary | ICD-10-CM | POA: Insufficient documentation

## 2021-02-02 DIAGNOSIS — Z87891 Personal history of nicotine dependence: Secondary | ICD-10-CM | POA: Insufficient documentation

## 2021-02-02 DIAGNOSIS — M25521 Pain in right elbow: Secondary | ICD-10-CM | POA: Diagnosis not present

## 2021-02-02 DIAGNOSIS — E1140 Type 2 diabetes mellitus with diabetic neuropathy, unspecified: Secondary | ICD-10-CM | POA: Diagnosis not present

## 2021-02-02 DIAGNOSIS — I10 Essential (primary) hypertension: Secondary | ICD-10-CM | POA: Diagnosis not present

## 2021-02-02 DIAGNOSIS — N1831 Chronic kidney disease, stage 3a: Secondary | ICD-10-CM | POA: Insufficient documentation

## 2021-02-02 DIAGNOSIS — R52 Pain, unspecified: Secondary | ICD-10-CM | POA: Diagnosis not present

## 2021-02-02 DIAGNOSIS — Z743 Need for continuous supervision: Secondary | ICD-10-CM | POA: Diagnosis not present

## 2021-02-02 DIAGNOSIS — E1122 Type 2 diabetes mellitus with diabetic chronic kidney disease: Secondary | ICD-10-CM | POA: Diagnosis not present

## 2021-02-02 DIAGNOSIS — R0789 Other chest pain: Secondary | ICD-10-CM | POA: Insufficient documentation

## 2021-02-02 MED ORDER — OXYCODONE-ACETAMINOPHEN 5-325 MG PO TABS
1.0000 | ORAL_TABLET | Freq: Once | ORAL | Status: AC
  Administered 2021-02-03: 1 via ORAL
  Filled 2021-02-02: qty 1

## 2021-02-02 NOTE — ED Triage Notes (Signed)
Per EMS, patient from [facility], shoulder injury in July after fall. Shoulder pain radiating down right arm since staff used hoyer lift for patient today. Bed bound. Hx of stroke.

## 2021-02-02 NOTE — ED Provider Notes (Signed)
[facility] DEPT Provider Note   CSN: [id number] Arrival date & time: 02/02/21  2214     History Chief Complaint  Patient presents with   Shoulder Pain    [name] is a 60 y.o. male presents to the emergency department with his complaint of right upper extremity pain.  Patient reports that pain started earlier today while he was being transferred via [place].  Patient states that pain starts in his right shoulder and radiates throughout his entire arm and into anterior chest.  Pain has been constant since incident occurred earlier today.  Patient states that pain has gotten gradually worse over time.  Patient rates pain 10/10 on the pain scale.  Pain is worse with movement and touch.  Patient reports that he took his prescribed Lyrica with no improvement in his pain.  Patient Dors is baseline weakness to bilateral lower extremities due to previous CVA.  No new weakness since his injury earlier this morning.  Patient denies any numbness, weakness, neck pain, back pain.  Patient denies falling or hitting the ground.  Patient is right-hand dominant.   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   Depression 11/18/2020   Acute renal failure superimposed on stage 3a chronic kidney disease ([place]) 11/18/2020   BPH (benign prostatic hyperplasia) 11/18/2020   AKI (acute kidney injury) ([place]) 08/22/2020   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 08/22/2020   Pericardial effusion 08/22/2020   Acute on chronic diastolic CHF (congestive  heart failure) ([place]) 08/22/2020   TIA (transient ischemic attack) 07/03/2020   Acute on chronic heart failure with preserved ejection fraction ([place])    Pulmonary artery hypertension ([place]) 06/25/2020   CKD (chronic kidney disease) 06/25/2020   History of cerebrovascular accident (CVA) with residual deficit 06/25/2020   CKD stage IIIa ([place]) 03/30/2020   Morbid (severe) obesity due to excess calories ([place]) 03/30/2020   Malnutrition of moderate degree 01/25/2020   Palliative care by specialist    Goals of care, counseling/discussion    Nephrotic syndrome 01/06/2020   Anasarca 01/06/2020   Hypokalemia due to loss of potassium 01/04/2020   Generalized body aches 01/01/2020   Non-traumatic rhabdomyolysis 01/01/2020   Chronic diastolic CHF (congestive heart failure) ([place]) 01/01/2020   Microcytic anemia 01/01/2020   Hypokalemia 12/31/2019   Elevated CK    Diabetes mellitus type 2, uncontrolled    Pure hypercholesterolemia    Hyperglycemia 09/28/2017   Nonobstructive atherosclerosis of coronary artery 09/28/2017   MDD (major depressive disorder), recurrent episode, severe ([place]) 07/30/2017   Osteoarthritis 04/12/2015   Severe recurrent major depression with psychotic features ([place]) 01/04/2015   Substance-related disorder ([place]) 10/30/2014   Patient's noncompliance with other medical treatment and regimen 06/07/2014   Cocaine dependence, uncomplicated ([place]) 62/13/0865   Chronic pain 05/31/2014   Cocaine abuse ([place]) 05/31/2014   History of noncompliance with medical treatment 05/31/2014   Malingering 05/31/2014   Leukopenia 05/31/2014   Diabetic neuropathy  associated with diabetes mellitus due to underlying condition ([place]) 05/31/2014   Diabetic neuropathy ([place]) 05/31/2014   Depression, major, recurrent, severe with psychosis ([place]) 08/29/2013   Suicidal ideations 08/28/2013   Uncontrolled diabetes mellitus 06/30/2013   Atypical chest pain 12/09/2011   Essential hypertension 12/09/2011    Episodic mood disorder ([place]) 05/13/2011    Past Surgical History:  Procedure Laterality Date   BOWEL DECOMPRESSION N/A 01/19/2020   Procedure: BOWEL DECOMPRESSION;  Surgeon: Otis Brace, MD;  Location: [facility];  Service: Gastroenterology;  Laterality: N/A;   BOWEL DECOMPRESSION N/A 11/19/2020   Procedure: BOWEL DECOMPRESSION;  Surgeon: Wilford Corner, MD;  Location: [facility];  Service: Endoscopy;  Laterality: N/A;   BOWEL DECOMPRESSION N/A 12/03/2020   Procedure: BOWEL DECOMPRESSION;  Surgeon: Ronnette Juniper, MD;  Location: [facility];  Service: Gastroenterology;  Laterality: N/A;   COLONOSCOPY WITH PROPOFOL N/A 01/19/2020   Procedure: COLONOSCOPY WITH PROPOFOL;  Surgeon: Otis Brace, MD;  Location: [facility];  Service: Gastroenterology;  Laterality: N/A;   COLONOSCOPY WITH PROPOFOL N/A 11/19/2020   Procedure: COLONOSCOPY WITH PROPOFOL;  Surgeon: Wilford Corner, MD;  Location: [facility];  Service: Endoscopy;  Laterality: N/A;   COLONOSCOPY WITH PROPOFOL N/A 12/03/2020   Procedure: COLONOSCOPY WITH PROPOFOL;  Surgeon: Ronnette Juniper, MD;  Location: [facility];  Service: Gastroenterology;  Laterality: N/A;   KNEE SURGERY     b/l knees; pending total knee    RADIOLOGY WITH ANESTHESIA N/A 07/04/2020   Procedure: MRI WITH ANESTHESIA BRAIN WITHOUT CONTRAST AND HEAD WITHOUT CONTRAST;  Surgeon: Radiologist, Medication, MD;  Location: [facility];  Service: Radiology;  Laterality: N/A;       Family History  Problem Relation Age of Onset   Diabetes Other    Cancer Mother        uterus; deceased    Heart attack Mother 96   Hypertension Other    Heart attack Father 77   Stroke Sister        12/2012 age 83 y.o    Social History   Tobacco Use   Smoking status: Former   Smokeless tobacco: Never  Scientific laboratory technician Use: Never used  Substance Use Topics   Alcohol use: No   Drug use: Not Currently    Types: Cocaine    Comment: Last use 2019    Home  Medications Prior to Admission medications   Medication Sig Start Date End Date Taking? Authorizing Provider  acetaminophen (TYLENOL) 650 MG CR tablet Take 650 mg by mouth every 4 (four) hours as needed (for general discomfort).    [provider]  amLODipine (NORVASC) 10 MG tablet Take 10 mg by mouth daily.    [provider]  aspirin 81 MG chewable tablet Chew 81 mg by mouth daily.    [provider]  atorvastatin (LIPITOR) 80 MG tablet Take 80 mg by mouth at bedtime.    [provider]  brimonidine (ALPHAGAN) 0.2 % ophthalmic solution Place 1 drop into both eyes in the morning and at bedtime. 12/20/20   Blanchie Dessert, MD  dorzolamide (TRUSOPT) 2 % ophthalmic solution Place 1 drop into both eyes 2 (two) times daily. 12/20/20   Blanchie Dessert, MD  empagliflozin (JARDIANCE) 10 MG TABS tablet Take 1 tablet (10 mg total) by mouth daily. Patient not taking: Reported on 12/19/2020 09/23/20   Raiford Noble Latif, DO  ferrous sulfate 325 (65 FE) MG tablet Take 1 tablet (325 mg total) by mouth daily. 09/23/20   Raiford Noble [place],  DO  insulin aspart (NOVOLOG) 100 UNIT/ML injection Inject 2-12 Units into the skin See admin instructions. Check and record blood sugar before meals and inject per ssi. 151-200 = 2 units  201-250 = 4 units  251-300 = 6 units  301-350 = 8 units  351-400 = 10 units  greater than 400 = 12 units and call MD. Patient not taking: Reported on 12/19/2020 09/07/20   Antonieta Pert, MD  insulin glargine (LANTUS) 100 UNIT/ML injection Inject 0.15 mLs (15 Units total) into the skin at bedtime. Patient not taking: Reported on 12/19/2020 09/22/20   Raiford Noble Latif, DO  insulin glargine-yfgn (SEMGLEE, YFGN,) 100 UNIT/ML Pen Inject 20 Units into the skin at bedtime.    [provider]  insulin lispro (HUMALOG) 100 UNIT/ML KwikPen Inject 0-10 Units into the skin See admin instructions. Inject 0-10 units into the skin three times a day before  meals, per sliding scale: BGL 0-150 = give nothing; 151-200 = 2 units; 201-250 = 4 units; 251-300 = 6 units; 301-350 = 8 units; 351-400 = 10 units    [provider]  isosorbide-hydrALAZINE (BIDIL) 20-37.5 MG tablet Take 1 tablet by mouth 3 (three) times daily. 09/07/20   Antonieta Pert, MD  JARDIANCE 10 MG TABS tablet Take 10 mg by mouth in the morning.    [provider]  loperamide (IMODIUM) 2 MG capsule Take 1 capsule (2 mg total) by mouth every 6 (six) hours as needed for diarrhea or loose stools. 12/11/20   Dwyane Dee, MD  ondansetron (ZOFRAN) 4 MG tablet Take 4 mg by mouth every 4 (four) hours as needed for nausea or vomiting.    [provider]  pantoprazole (PROTONIX) 40 MG tablet Take 40 mg by mouth daily.    [provider]  polyethylene glycol (MIRALAX / GLYCOLAX) 17 g packet Take 17 g by mouth daily as needed. Patient taking differently: Take 17 g by mouth daily as needed (for constipation- mix and drink). 03/30/20   [name], NP  potassium chloride (KLOR-CON) 10 MEQ tablet Take 2 tablets (20 mEq total) by mouth daily. 12/11/20   Dwyane Dee, MD  pregabalin (LYRICA) 75 MG capsule Take 75 mg by mouth 2 (two) times daily.    [provider]  saccharomyces boulardii (FLORASTOR) 250 MG capsule Take 1 capsule (250 mg total) by mouth 2 (two) times daily. 09/07/20   Antonieta Pert, MD  sacubitril-valsartan (ENTRESTO) 49-51 MG Take 1 tablet by mouth 2 (two) times daily.    [provider]  senna-docusate (SENOKOT-S) 8.6-50 MG tablet Take 1 tablet by mouth daily.    [provider]  sertraline (ZOLOFT) 50 MG tablet Take 50 mg by mouth at bedtime.    [provider]  tamsulosin (FLOMAX) 0.4 MG CAPS capsule Take 0.8 mg by mouth daily.    [provider]  torsemide (DEMADEX) 20 MG tablet Take 40 mg by mouth 2 (two) times daily.    [provider]  Vitamin D, Ergocalciferol, (DRISDOL) 1.25 MG (50000 UNIT)  CAPS capsule Take 50,000 Units by mouth once. Patient not taking: Reported on 12/19/2020    [provider]    Allergies    Other  Review of Systems   Review of Systems  Constitutional:  Negative for chills and fever.  Eyes:  Negative for visual disturbance.  Respiratory:  Negative for shortness of breath.   Cardiovascular:  Negative for chest pain.  Gastrointestinal:  Negative for abdominal pain, nausea and vomiting.  Genitourinary:  Negative for difficulty urinating and dysuria.  Musculoskeletal:  Positive for arthralgias and myalgias. Negative for back pain and neck pain.  Skin:  Negative for color change and rash.  Neurological:  Positive for weakness (Baseline bilateral lower leg weakness). Negative for dizziness, syncope, light-headedness, numbness and headaches.  Psychiatric/Behavioral:  Negative for confusion.    Physical Exam Updated Vital Signs BP (!) 155/96 (BP Location: Left Arm)   Pulse 85   Temp 97.8 F (36.6 C) (Oral)   Resp 15   Ht 5\' 8"  (1.727 m)   Wt 98.9 kg   SpO2 100%   BMI 33.15 kg/m    Physical Exam Vitals and nursing note reviewed.  Constitutional:      General: He is not in acute distress.    Appearance: He is not ill-appearing, toxic-appearing or diaphoretic.  HENT:     Head: Normocephalic.  Eyes:     General: No scleral icterus.       Right eye: No discharge.        Left eye: No discharge.  Cardiovascular:     Rate and Rhythm: Normal rate.     Pulses:          Radial pulses are 2+ on the right side and 2+ on the left side.  Pulmonary:     Effort: Pulmonary effort is normal.  Chest:     Chest wall: Tenderness present. No mass, lacerations, deformity, swelling, crepitus or edema.     Comments: Tenderness to right upper anterior chest Musculoskeletal:     Right shoulder: Tenderness and bony tenderness present. No swelling, deformity, effusion, laceration or crepitus. Decreased range of motion.     Left shoulder: No swelling,  deformity, effusion, laceration, tenderness, bony tenderness or crepitus. Normal range of motion.     Right upper arm: Tenderness and bony tenderness present. No swelling, edema, deformity or lacerations.     Left upper arm: Normal.     Right elbow: No swelling, deformity, effusion or lacerations. Normal range of motion. Tenderness present.     Left elbow: Normal.     Right forearm: Tenderness and bony tenderness present. No swelling, edema, deformity or lacerations.     Left forearm: Normal.     Right wrist: Tenderness and bony tenderness present. No swelling, deformity, effusion, lacerations, snuff box tenderness or crepitus. Normal range of motion. Normal pulse.     Left wrist: Normal.     Right hand: No swelling, deformity, lacerations, tenderness or bony tenderness. Normal range of motion. Normal strength. Normal sensation. Normal capillary refill.     Left hand: No swelling, deformity, lacerations, tenderness or bony tenderness. Normal range of motion. Normal strength. Normal sensation. Normal capillary refill.     Cervical back: Normal range of motion and neck supple. No edema, erythema, signs of trauma, rigidity, torticollis or crepitus. No pain with movement, spinous process tenderness or muscular tenderness. Normal range of motion.     Comments: Diffuse tenderness throughout entire right upper extremity.  Tenderness is worse at right shoulder, right elbow, and right wrist.  Decreased range of motion to right shoulder secondary to complaints of pain.  Patient has full range of motion to right elbow and right wrist.    Skin:    General: Skin is warm and dry.  Neurological:     General: No focal deficit present.     Mental Status: He is alert.  Psychiatric:        Behavior: Behavior is cooperative.  ED Results / Procedures / Treatments   Labs (all labs ordered are listed, but only abnormal results are displayed) Labs Reviewed - No data to display  EKG None  Radiology No  results found.  Procedures Procedures   Medications Ordered in ED Medications  oxyCODONE-acetaminophen (PERCOCET/ROXICET) 5-325 MG per tablet 1 tablet (has no administration in time range)    ED Course  I have reviewed the triage vital signs and the nursing notes.  Pertinent labs & imaging results that were available during my care of the patient were reviewed by me and considered in my medical decision making (see chart for details).    MDM Rules/Calculators/A&P                           Alert 60 year old male in no acute distress, nontoxic-appearing.  Presents emergency department with chief complaint of pain to right upper extremity.  Pain began today after he was moved via [place].  Denies any falls.  Pain has been constant since this incident.  Pain is worse with movement and touch.  Denies any neck pain or back pain.  Patient has tenderness diffusely throughout entire right upper extremity and right anterior chest.  No midline tenderness or deformity to cervical neck.  Patient has full range of motion to neck without complaints of pain.  Will give patient Percocet for pain management.  Will obtain x-ray imaging of right upper extremity.  X-ray imaging pending at this time. If x-ray imaging is unremarkable plan to discharge back to nursing facility.  Patient care transferred to [facility] at the end of my shift. Patient presentation, ED course, and plan of care discussed with review of all pertinent labs and imaging. Please see his/her note for further details regarding further ED course and disposition.   Final Clinical Impression(s) / ED Diagnoses Final diagnoses:  None    Rx / DC Orders ED Discharge Orders     None        Dyann Ruddle 02/02/21 2351    Luna Fuse, MD 02/05/21 [phone]

## 2021-02-02 NOTE — ED Notes (Signed)
Pt did not want any ice for his shoulder.

## 2021-02-03 DIAGNOSIS — M25511 Pain in right shoulder: Secondary | ICD-10-CM | POA: Diagnosis not present

## 2021-02-03 DIAGNOSIS — Z743 Need for continuous supervision: Secondary | ICD-10-CM | POA: Diagnosis not present

## 2021-02-03 DIAGNOSIS — G8929 Other chronic pain: Secondary | ICD-10-CM | POA: Diagnosis not present

## 2021-02-03 NOTE — ED Notes (Addendum)
Will move patient to stretcher for comfort

## 2021-02-03 NOTE — ED Notes (Signed)
[facility] called for transport.  

## 2021-02-03 NOTE — ED Notes (Signed)
Patient resting quietly in hallway.  No complaints voiced.  Waiting for [facility] to transport home

## 2021-02-03 NOTE — Discharge Instructions (Addendum)
The x-rays performed in the emergency department tonight did not show any broken bones.  You should be evaluated by an orthopedic specialist to have further assessment of your pain.

## 2021-02-03 NOTE — ED Notes (Signed)
Report received from nightshift RN, Jonelle Sidle. Pt awaiting [facility] transportation back to [facility] at this time. Pt in direct line of sight of nurses station. Resting with eyes closed. Respirations even and unlabored. Pt attached to dinamap x2. O2 sats 100% on room air and HR of 77bpm.

## 2021-02-03 NOTE — ED Provider Notes (Signed)
Patient here with right arm pain after being transferred via [place].  Plain films are negative.  I do not see any obvious deformity or injury to the right arm.  He could have a tendon or ligament injury.  Will recommend orthopedic follow-up.  Will give sling for comfort.  Discussed plan with patient.   Montine Circle, PA-C 14/44/58 [id number]    Delora Fuel, MD 07/57/32 [phone]

## 2021-02-03 NOTE — ED Notes (Signed)
[facility] here to take pt  

## 2021-02-22 DIAGNOSIS — I5030 Unspecified diastolic (congestive) heart failure: Secondary | ICD-10-CM | POA: Diagnosis not present

## 2021-02-22 DIAGNOSIS — R52 Pain, unspecified: Secondary | ICD-10-CM | POA: Diagnosis not present

## 2021-02-22 DIAGNOSIS — I10 Essential (primary) hypertension: Secondary | ICD-10-CM | POA: Diagnosis not present

## 2021-02-22 DIAGNOSIS — I2510 Atherosclerotic heart disease of native coronary artery without angina pectoris: Secondary | ICD-10-CM | POA: Diagnosis not present

## 2021-02-28 ENCOUNTER — Encounter (HOSPITAL_COMMUNITY): Payer: Self-pay

## 2021-02-28 ENCOUNTER — Other Ambulatory Visit: Payer: Self-pay

## 2021-02-28 ENCOUNTER — Emergency Department (HOSPITAL_COMMUNITY): Payer: Medicare HMO

## 2021-02-28 ENCOUNTER — Inpatient Hospital Stay (HOSPITAL_COMMUNITY)
Admission: EM | Admit: 2021-02-28 | Discharge: 2021-03-12 | DRG: 240 | Disposition: A | Discharge status: Skilled Nursing Facility | Payer: Medicare HMO | Source: Skilled Nursing Facility | Attending: Internal Medicine | Admitting: Internal Medicine

## 2021-02-28 DIAGNOSIS — Z79899 Other long term (current) drug therapy: Secondary | ICD-10-CM

## 2021-02-28 DIAGNOSIS — M7732 Calcaneal spur, left foot: Secondary | ICD-10-CM | POA: Diagnosis not present

## 2021-02-28 DIAGNOSIS — M86172 Other acute osteomyelitis, left ankle and foot: Secondary | ICD-10-CM | POA: Diagnosis present

## 2021-02-28 DIAGNOSIS — E11628 Type 2 diabetes mellitus with other skin complications: Secondary | ICD-10-CM

## 2021-02-28 DIAGNOSIS — E1152 Type 2 diabetes mellitus with diabetic peripheral angiopathy with gangrene: Principal | ICD-10-CM | POA: Diagnosis present

## 2021-02-28 DIAGNOSIS — D631 Anemia in chronic kidney disease: Secondary | ICD-10-CM | POA: Diagnosis present

## 2021-02-28 DIAGNOSIS — M6281 Muscle weakness (generalized): Secondary | ICD-10-CM | POA: Diagnosis not present

## 2021-02-28 DIAGNOSIS — E1122 Type 2 diabetes mellitus with diabetic chronic kidney disease: Secondary | ICD-10-CM | POA: Diagnosis present

## 2021-02-28 DIAGNOSIS — R339 Retention of urine, unspecified: Secondary | ICD-10-CM | POA: Diagnosis not present

## 2021-02-28 DIAGNOSIS — Z87891 Personal history of nicotine dependence: Secondary | ICD-10-CM

## 2021-02-28 DIAGNOSIS — I10 Essential (primary) hypertension: Secondary | ICD-10-CM | POA: Diagnosis not present

## 2021-02-28 DIAGNOSIS — N1831 Chronic kidney disease, stage 3a: Secondary | ICD-10-CM | POA: Diagnosis present

## 2021-02-28 DIAGNOSIS — E1149 Type 2 diabetes mellitus with other diabetic neurological complication: Secondary | ICD-10-CM

## 2021-02-28 DIAGNOSIS — I70262 Atherosclerosis of native arteries of extremities with gangrene, left leg: Secondary | ICD-10-CM | POA: Diagnosis present

## 2021-02-28 DIAGNOSIS — R451 Restlessness and agitation: Secondary | ICD-10-CM | POA: Diagnosis not present

## 2021-02-28 DIAGNOSIS — M7989 Other specified soft tissue disorders: Secondary | ICD-10-CM | POA: Diagnosis not present

## 2021-02-28 DIAGNOSIS — S91302A Unspecified open wound, left foot, initial encounter: Secondary | ICD-10-CM | POA: Diagnosis not present

## 2021-02-28 DIAGNOSIS — I739 Peripheral vascular disease, unspecified: Secondary | ICD-10-CM | POA: Diagnosis present

## 2021-02-28 DIAGNOSIS — R269 Unspecified abnormalities of gait and mobility: Secondary | ICD-10-CM | POA: Diagnosis not present

## 2021-02-28 DIAGNOSIS — I69351 Hemiplegia and hemiparesis following cerebral infarction affecting right dominant side: Secondary | ICD-10-CM

## 2021-02-28 DIAGNOSIS — Z8249 Family history of ischemic heart disease and other diseases of the circulatory system: Secondary | ICD-10-CM

## 2021-02-28 DIAGNOSIS — L97528 Non-pressure chronic ulcer of other part of left foot with other specified severity: Secondary | ICD-10-CM | POA: Diagnosis present

## 2021-02-28 DIAGNOSIS — Z20822 Contact with and (suspected) exposure to covid-19: Secondary | ICD-10-CM | POA: Diagnosis present

## 2021-02-28 DIAGNOSIS — M79673 Pain in unspecified foot: Secondary | ICD-10-CM | POA: Diagnosis not present

## 2021-02-28 DIAGNOSIS — Z833 Family history of diabetes mellitus: Secondary | ICD-10-CM

## 2021-02-28 DIAGNOSIS — M19072 Primary osteoarthritis, left ankle and foot: Secondary | ICD-10-CM | POA: Diagnosis not present

## 2021-02-28 DIAGNOSIS — R739 Hyperglycemia, unspecified: Secondary | ICD-10-CM | POA: Diagnosis not present

## 2021-02-28 DIAGNOSIS — M869 Osteomyelitis, unspecified: Secondary | ICD-10-CM

## 2021-02-28 DIAGNOSIS — E440 Moderate protein-calorie malnutrition: Secondary | ICD-10-CM | POA: Diagnosis present

## 2021-02-28 DIAGNOSIS — E1169 Type 2 diabetes mellitus with other specified complication: Secondary | ICD-10-CM | POA: Diagnosis present

## 2021-02-28 DIAGNOSIS — Z7401 Bed confinement status: Secondary | ICD-10-CM

## 2021-02-28 DIAGNOSIS — I5033 Acute on chronic diastolic (congestive) heart failure: Secondary | ICD-10-CM

## 2021-02-28 DIAGNOSIS — N179 Acute kidney failure, unspecified: Secondary | ICD-10-CM | POA: Diagnosis present

## 2021-02-28 DIAGNOSIS — Z09 Encounter for follow-up examination after completed treatment for conditions other than malignant neoplasm: Secondary | ICD-10-CM

## 2021-02-28 DIAGNOSIS — I2721 Secondary pulmonary arterial hypertension: Secondary | ICD-10-CM | POA: Diagnosis present

## 2021-02-28 DIAGNOSIS — N400 Enlarged prostate without lower urinary tract symptoms: Secondary | ICD-10-CM | POA: Diagnosis present

## 2021-02-28 DIAGNOSIS — I959 Hypotension, unspecified: Secondary | ICD-10-CM | POA: Diagnosis not present

## 2021-02-28 DIAGNOSIS — I96 Gangrene, not elsewhere classified: Secondary | ICD-10-CM | POA: Diagnosis not present

## 2021-02-28 DIAGNOSIS — T797XXA Traumatic subcutaneous emphysema, initial encounter: Secondary | ICD-10-CM | POA: Diagnosis not present

## 2021-02-28 DIAGNOSIS — Z87820 Personal history of traumatic brain injury: Secondary | ICD-10-CM

## 2021-02-28 DIAGNOSIS — Z6834 Body mass index (BMI) 34.0-34.9, adult: Secondary | ICD-10-CM

## 2021-02-28 DIAGNOSIS — E876 Hypokalemia: Secondary | ICD-10-CM | POA: Diagnosis present

## 2021-02-28 DIAGNOSIS — E1165 Type 2 diabetes mellitus with hyperglycemia: Secondary | ICD-10-CM | POA: Diagnosis present

## 2021-02-28 DIAGNOSIS — E11621 Type 2 diabetes mellitus with foot ulcer: Secondary | ICD-10-CM | POA: Diagnosis present

## 2021-02-28 DIAGNOSIS — E11622 Type 2 diabetes mellitus with other skin ulcer: Secondary | ICD-10-CM | POA: Diagnosis not present

## 2021-02-28 DIAGNOSIS — H409 Unspecified glaucoma: Secondary | ICD-10-CM | POA: Diagnosis present

## 2021-02-28 DIAGNOSIS — E871 Hypo-osmolality and hyponatremia: Secondary | ICD-10-CM | POA: Diagnosis present

## 2021-02-28 DIAGNOSIS — Z7982 Long term (current) use of aspirin: Secondary | ICD-10-CM

## 2021-02-28 DIAGNOSIS — F40240 Claustrophobia: Secondary | ICD-10-CM | POA: Diagnosis present

## 2021-02-28 DIAGNOSIS — L089 Local infection of the skin and subcutaneous tissue, unspecified: Secondary | ICD-10-CM | POA: Diagnosis not present

## 2021-02-28 DIAGNOSIS — E1142 Type 2 diabetes mellitus with diabetic polyneuropathy: Secondary | ICD-10-CM | POA: Diagnosis present

## 2021-02-28 DIAGNOSIS — F333 Major depressive disorder, recurrent, severe with psychotic symptoms: Secondary | ICD-10-CM | POA: Diagnosis present

## 2021-02-28 DIAGNOSIS — Z794 Long term (current) use of insulin: Secondary | ICD-10-CM

## 2021-02-28 DIAGNOSIS — Z993 Dependence on wheelchair: Secondary | ICD-10-CM

## 2021-02-28 DIAGNOSIS — E8721 Acute metabolic acidosis: Secondary | ICD-10-CM | POA: Diagnosis present

## 2021-02-28 DIAGNOSIS — I130 Hypertensive heart and chronic kidney disease with heart failure and stage 1 through stage 4 chronic kidney disease, or unspecified chronic kidney disease: Secondary | ICD-10-CM | POA: Diagnosis present

## 2021-02-28 DIAGNOSIS — E669 Obesity, unspecified: Secondary | ICD-10-CM | POA: Diagnosis present

## 2021-02-28 DIAGNOSIS — Z7984 Long term (current) use of oral hypoglycemic drugs: Secondary | ICD-10-CM

## 2021-02-28 DIAGNOSIS — I5032 Chronic diastolic (congestive) heart failure: Secondary | ICD-10-CM | POA: Diagnosis present

## 2021-02-28 DIAGNOSIS — G8929 Other chronic pain: Secondary | ICD-10-CM | POA: Diagnosis present

## 2021-02-28 DIAGNOSIS — H548 Legal blindness, as defined in USA: Secondary | ICD-10-CM | POA: Diagnosis present

## 2021-02-28 DIAGNOSIS — Z743 Need for continuous supervision: Secondary | ICD-10-CM | POA: Diagnosis not present

## 2021-02-28 DIAGNOSIS — F05 Delirium due to known physiological condition: Secondary | ICD-10-CM | POA: Diagnosis not present

## 2021-02-28 DIAGNOSIS — M1990 Unspecified osteoarthritis, unspecified site: Secondary | ICD-10-CM | POA: Diagnosis present

## 2021-02-28 DIAGNOSIS — F319 Bipolar disorder, unspecified: Secondary | ICD-10-CM | POA: Diagnosis present

## 2021-02-28 NOTE — ED Triage Notes (Signed)
Pt bib [facility] from [facility] after wound care nurse noted necrotic toes and absence of pedal pulse on the L foot. Pt is axo x4, legally blind and bedbound. CBG 217, BP 118/64, O2 93% RA, HR 84

## 2021-02-28 NOTE — ED Provider Notes (Signed)
[facility] EMERGENCY DEPARTMENT Provider Note  CSN: [id number] Arrival date & time: 02/28/21 2225  Chief Complaint(s) Wound Check  HPI [name] is a 61 y.o. male with PMH CHF, HTN, CVA with residual right-sided weakness, T2DM, CKD 3, polysubstance abuse, blindness, previous admission for Ogilvie syndrome who presents to the emergency department for evaluation of a wound infection.  Patient arrives from his facility with concerns of a pulseless foot and an open wound on the left toes.  On arrival, the wound is foul-smelling and actively draining but there is a dopplerable pulse.  Patient denies chest pain, shortness of breath, abdominal pain, nausea, vomiting, fever or other systemic symptoms.   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   Depression 11/18/2020   Acute renal failure superimposed on stage 3a chronic kidney disease ([place]) 11/18/2020   BPH (benign prostatic hyperplasia) 11/18/2020   AKI (acute kidney injury) ([place]) 08/22/2020   CVA ([place]) with residual right-sided weakness 08/22/2020   Pericardial effusion 08/22/2020   Acute on chronic diastolic CHF (congestive heart failure) ([name]) 08/22/2020   TIA (transient ischemic attack) 07/03/2020   Acute on chronic heart failure with preserved ejection fraction ([place])    Pulmonary artery hypertension ([place]) 06/25/2020   CKD (chronic kidney disease) 06/25/2020   History of cerebrovascular accident (CVA) with residual deficit 06/25/2020   CKD stage IIIa ([place]) 03/30/2020   Morbid (severe) obesity due to excess calories ([place]) 03/30/2020    Malnutrition of moderate degree 01/25/2020   Palliative care by specialist    Goals of care, counseling/discussion    Nephrotic syndrome 01/06/2020   Anasarca 01/06/2020   Hypokalemia due to loss of potassium 01/04/2020   Generalized body aches 01/01/2020   Non-traumatic rhabdomyolysis 01/01/2020   Chronic diastolic CHF (congestive heart failure) ([place]) 01/01/2020   Microcytic anemia 01/01/2020   Hypokalemia 12/31/2019   Elevated CK    Diabetes mellitus type 2, uncontrolled    Pure hypercholesterolemia    Hyperglycemia 09/28/2017   Nonobstructive atherosclerosis of coronary artery 09/28/2017   MDD (major depressive disorder), recurrent episode, severe ([place]) 07/30/2017   Osteoarthritis 04/12/2015   Severe recurrent major depression with psychotic features ([place]) 01/04/2015   Substance-related disorder ([place]) 10/30/2014   Patient's noncompliance with other medical treatment and regimen 06/07/2014   Cocaine dependence, uncomplicated ([place]) 65/68/1275   Chronic pain 05/31/2014   Cocaine abuse ([place]) 05/31/2014   History of noncompliance with medical treatment 05/31/2014   Malingering 05/31/2014   Leukopenia 05/31/2014   Diabetic neuropathy associated with diabetes mellitus due to underlying condition ([place]) 05/31/2014   Diabetic neuropathy ([place]) 05/31/2014   Depression, major, recurrent, severe with psychosis ([place]) 08/29/2013   Suicidal ideations 08/28/2013   Uncontrolled diabetes mellitus 06/30/2013   Atypical chest pain 12/09/2011   Essential hypertension 12/09/2011   Episodic mood disorder ([place]) 05/13/2011   Home Medication(s) Prior to Admission medications   Medication Sig Start Date End Date Taking? Authorizing Provider  acetaminophen (TYLENOL) 650 MG CR tablet Take 650 mg by mouth every 4 (  four) hours as needed (for general discomfort).    [provider]  amLODipine (NORVASC) 10 MG tablet Take 10 mg by mouth daily.    [provider]  aspirin 81 MG chewable  tablet Chew 81 mg by mouth daily.    [provider]  atorvastatin (LIPITOR) 80 MG tablet Take 80 mg by mouth at bedtime.    [provider]  brimonidine (ALPHAGAN) 0.2 % ophthalmic solution Place 1 drop into both eyes in the morning and at bedtime. 12/20/20   Blanchie Dessert, MD  dorzolamide (TRUSOPT) 2 % ophthalmic solution Place 1 drop into both eyes 2 (two) times daily. 12/20/20   Blanchie Dessert, MD  empagliflozin (JARDIANCE) 10 MG TABS tablet Take 1 tablet (10 mg total) by mouth daily. Patient not taking: Reported on 12/19/2020 09/23/20   Raiford Noble Latif, DO  ferrous sulfate 325 (65 FE) MG tablet Take 1 tablet (325 mg total) by mouth daily. 09/23/20   [name], Georgina Quint Latif, DO  insulin aspart (NOVOLOG) 100 UNIT/ML injection Inject 2-12 Units into the skin See admin instructions. Check and record blood sugar before meals and inject per ssi. 151-200 = 2 units   201-250 = 4 units   251-300 = 6 units   301-350 = 8 units   351-400 = 10 units   greater than 400 = 12 units and call MD. Patient not taking: Reported on 12/19/2020 09/07/20   Antonieta Pert, MD  insulin glargine (LANTUS) 100 UNIT/ML injection Inject 0.15 mLs (15 Units total) into the skin at bedtime. Patient not taking: Reported on 12/19/2020 09/22/20   Raiford Noble Latif, DO  insulin glargine-yfgn (SEMGLEE, YFGN,) 100 UNIT/ML Pen Inject 20 Units into the skin at bedtime.    [provider]  insulin lispro (HUMALOG) 100 UNIT/ML KwikPen Inject 0-10 Units into the skin See admin instructions. Inject 0-10 units into the skin three times a day before meals, per sliding scale: BGL 0-150 = give nothing; 151-200 = 2 units; 201-250 = 4 units; 251-300 = 6 units; 301-350 = 8 units; 351-400 = 10 units    [provider]  isosorbide-hydrALAZINE (BIDIL) 20-37.5 MG tablet Take 1 tablet by mouth 3 (three) times daily. 09/07/20   Antonieta Pert, MD  JARDIANCE 10 MG TABS tablet Take 10 mg by mouth in the morning.    [provider]  loperamide (IMODIUM) 2 MG capsule Take 1 capsule (2 mg total) by mouth every 6 (six) hours as needed for diarrhea or loose stools. 12/11/20   Dwyane Dee, MD  ondansetron (ZOFRAN) 4 MG tablet Take 4 mg by mouth every 4 (four) hours as needed for nausea or vomiting.    [provider]  pantoprazole (PROTONIX) 40 MG tablet Take 40 mg by mouth daily.    [provider]  polyethylene glycol (MIRALAX / GLYCOLAX) 17 g packet Take 17 g by mouth daily as needed. Patient taking differently: Take 17 g by mouth daily as needed (for constipation- mix and drink). 03/30/20   [name], NP  potassium chloride (KLOR-CON) 10 MEQ tablet Take 2 tablets (20 mEq total) by mouth daily. 12/11/20   Dwyane Dee, MD  pregabalin (LYRICA) 75 MG capsule Take 75 mg by mouth 2 (two) times daily.    [provider]  saccharomyces boulardii (FLORASTOR) 250 MG capsule Take 1 capsule (250 mg total) by mouth 2 (two) times daily. 09/07/20   Antonieta Pert, MD  sacubitril-valsartan (ENTRESTO) 49-51 MG Take 1 tablet by mouth 2 (  two) times daily.    [provider]  senna-docusate (SENOKOT-S) 8.6-50 MG tablet Take 1 tablet by mouth daily.    [provider]  sertraline (ZOLOFT) 50 MG tablet Take 50 mg by mouth at bedtime.    [provider]  tamsulosin (FLOMAX) 0.4 MG CAPS capsule Take 0.8 mg by mouth daily.    [provider]  torsemide (DEMADEX) 20 MG tablet Take 40 mg by mouth 2 (two) times daily.    [provider]  Vitamin D, Ergocalciferol, (DRISDOL) 1.25 MG (50000 UNIT) CAPS capsule Take 50,000 Units by mouth once. Patient not taking: Reported on 12/19/2020    [provider]                                                                                                                                    Past Surgical History Past Surgical History:  Procedure Laterality Date   BOWEL DECOMPRESSION N/A 01/19/2020   Procedure:  BOWEL DECOMPRESSION;  Surgeon: Otis Brace, MD;  Location: [facility];  Service: Gastroenterology;  Laterality: N/A;   BOWEL DECOMPRESSION N/A 11/19/2020   Procedure: BOWEL DECOMPRESSION;  Surgeon: Wilford Corner, MD;  Location: [facility];  Service: Endoscopy;  Laterality: N/A;   BOWEL DECOMPRESSION N/A 12/03/2020   Procedure: BOWEL DECOMPRESSION;  Surgeon: Ronnette Juniper, MD;  Location: [facility];  Service: Gastroenterology;  Laterality: N/A;   COLONOSCOPY WITH PROPOFOL N/A 01/19/2020   Procedure: COLONOSCOPY WITH PROPOFOL;  Surgeon: Otis Brace, MD;  Location: [facility];  Service: Gastroenterology;  Laterality: N/A;   COLONOSCOPY WITH PROPOFOL N/A 11/19/2020   Procedure: COLONOSCOPY WITH PROPOFOL;  Surgeon: Wilford Corner, MD;  Location: [facility];  Service: Endoscopy;  Laterality: N/A;   COLONOSCOPY WITH PROPOFOL N/A 12/03/2020   Procedure: COLONOSCOPY WITH PROPOFOL;  Surgeon: Ronnette Juniper, MD;  Location: [facility];  Service: Gastroenterology;  Laterality: N/A;   KNEE SURGERY     b/l knees; pending total knee    RADIOLOGY WITH ANESTHESIA N/A 07/04/2020   Procedure: MRI WITH ANESTHESIA BRAIN WITHOUT CONTRAST AND HEAD WITHOUT CONTRAST;  Surgeon: Radiologist, Medication, MD;  Location: [facility];  Service: Radiology;  Laterality: N/A;   Family History Family History  Problem Relation Age of Onset   Diabetes Other    Cancer Mother        uterus; deceased    Heart attack Mother 53   Hypertension Other    Heart attack Father 73   Stroke Sister        12/2012 age 54 y.o    Social History Social History   Tobacco Use   Smoking status: Former   Smokeless tobacco: Never  Scientific laboratory technician Use: Never used  Substance Use Topics   Alcohol use: No   Drug use: Not Currently    Types: Cocaine    Comment: Last use 2019   Allergies Other  Review of Systems Review of Systems  Skin:  Positive for rash and wound.   Physical Exam Vital Signs  I have reviewed  the triage vital signs BP 106/69 (BP Location: Left Arm)    Pulse 82    Temp 98.9 F (37.2 C) (Oral)    Resp 16    Ht 5' 8" (1.727 m)    Wt 91.2 kg    SpO2 100%    BMI 30.56 kg/m   Physical Exam Vitals and nursing note reviewed.  Constitutional:      General: He is not in acute distress.    Appearance: He is well-developed.  HENT:     Head: Normocephalic and atraumatic.  Eyes:     Conjunctiva/sclera: Conjunctivae normal.  Cardiovascular:     Rate and Rhythm: Normal rate and regular rhythm.     Heart sounds: No murmur heard. Pulmonary:     Effort: Pulmonary effort is normal. No respiratory distress.     Breath sounds: Normal breath sounds.  Abdominal:     Palpations: Abdomen is soft.     Tenderness: There is no abdominal tenderness.  Musculoskeletal:        General: Swelling and tenderness present.     Cervical back: Neck supple.  Skin:    General: Skin is warm and dry.     Capillary Refill: Capillary refill takes less than 2 seconds.     Findings: Lesion present.  Neurological:     Mental Status: He is alert.  Psychiatric:        Mood and Affect: Mood normal.      ED Results and Treatments Labs (all labs ordered are listed, but only abnormal results are displayed) Labs Reviewed  CBC WITH DIFFERENTIAL/PLATELET  COMPREHENSIVE METABOLIC PANEL  SEDIMENTATION RATE  C-REACTIVE PROTEIN                                                                                                                         EKG  EKG Interpretation  Date/Time:    Ventricular Rate:    PR Interval:    QRS Duration:   QT Interval:    QTC Calculation:   R Axis:     Text Interpretation:         Radiology DG Foot Complete Left  Result Date: 02/28/2021 CLINICAL DATA:  Right left foot wound check, rule out gas formation. EXAM: LEFT FOOT - COMPLETE 3+ VIEW COMPARISON:  None. FINDINGS: Evaluation of the third and fourth digits is limited due to overlying bandage material. No acute fracture  or dislocation. Gas is identified the soft tissues adjacent about the third digit and head of the third metatarsal. There are questionable erosions along the medial aspect of the head of the fifth metatarsal. Mild calcaneal spurring is present. Vascular calcifications are noted in the soft tissues. Degenerative changes are noted in the midfoot and ankle. IMPRESSION: 1. Gas in the soft tissues about the third digit and head of the third metatarsal, compatible with history of infection. The possibility of  necrotizing fasciitis can not be excluded. 2. Lucency at the head of the fifth metatarsal. The possibility of underlying osteomyelitis can not be excluded. MRI or three-phase bone scan is recommended for further evaluation. Electronically Signed   By: Brett Fairy M.D.   On: 02/28/2021 23:51    Pertinent labs & imaging results that were available during my care of the patient were reviewed by me and considered in my medical decision making (see MDM for details).  Medications Ordered in ED Medications - No data to display                                                                                                                                   Procedures .Critical Care Performed by: Teressa Lower, MD Authorized by: Teressa Lower, MD   Critical care provider statement:    Critical care time (minutes):  30   Critical care was necessary to treat or prevent imminent or life-threatening deterioration of the following conditions:  Sepsis (nec fasc)   Critical care was time spent personally by me on the following activities:  Development of treatment plan with patient or surrogate, discussions with consultants, evaluation of patient's response to treatment, examination of patient, ordering and review of laboratory studies, ordering and review of radiographic studies, ordering and performing treatments and interventions, pulse oximetry, re-evaluation of patient's condition and review of old  charts  (including critical care time)  Medical Decision Making / ED Course  Patient seen the emergency department for evaluation of a necrotic foot wound.  Physical exam reveals foul-smelling foot wound at the base of the third and fourth digits on the left.  Laboratory evaluation with leukocytosis to 12.0, hemoglobin 8.0, hyponatremia to 129, CO2 of 16 but with a normal lactate of 0.6, creatinine elevated to 2.33, BUN 87.  Hypoalbuminemia to 2.1, CRP is elevated to 18.7, ESR 124.  X-ray imaging shows gas in the soft tissues around the third digit and the head of the third metatarsal as well as a lucency at the head of the fifth metatarsal.  The patient has a LRINEC score of 11 indicating a very high risk of necrotizing soft tissue infection (>93%).  Broad-spectrum antibiotics for necrotizing infection initiated with Vanco, Zosyn and clinda.  Spoke with podiatry who is recommending medical admission and likely surgical removal of the digits tomorrow.  Patient then admitted to medicine.        Final Clinical Impression(s) / ED Diagnoses Final diagnoses:  None     _0 @    Teressa Lower, MD 03/01/21 [phone]

## 2021-03-01 ENCOUNTER — Emergency Department (HOSPITAL_COMMUNITY): Payer: Medicare HMO

## 2021-03-01 ENCOUNTER — Inpatient Hospital Stay (HOSPITAL_COMMUNITY): Payer: Medicare HMO

## 2021-03-01 ENCOUNTER — Encounter (HOSPITAL_COMMUNITY): Payer: Self-pay | Admitting: Internal Medicine

## 2021-03-01 ENCOUNTER — Encounter (HOSPITAL_COMMUNITY)
Admission: EM | Disposition: A | Discharge status: Skilled Nursing Facility | Payer: Self-pay | Source: Skilled Nursing Facility | Attending: Internal Medicine

## 2021-03-01 ENCOUNTER — Inpatient Hospital Stay (HOSPITAL_COMMUNITY): Payer: Medicare HMO | Admitting: Anesthesiology

## 2021-03-01 DIAGNOSIS — L97528 Non-pressure chronic ulcer of other part of left foot with other specified severity: Secondary | ICD-10-CM | POA: Diagnosis present

## 2021-03-01 DIAGNOSIS — L0390 Cellulitis, unspecified: Secondary | ICD-10-CM

## 2021-03-01 DIAGNOSIS — M7989 Other specified soft tissue disorders: Secondary | ICD-10-CM | POA: Diagnosis not present

## 2021-03-01 DIAGNOSIS — N179 Acute kidney failure, unspecified: Secondary | ICD-10-CM | POA: Diagnosis not present

## 2021-03-01 DIAGNOSIS — I5032 Chronic diastolic (congestive) heart failure: Secondary | ICD-10-CM | POA: Diagnosis not present

## 2021-03-01 DIAGNOSIS — Z794 Long term (current) use of insulin: Secondary | ICD-10-CM | POA: Diagnosis not present

## 2021-03-01 DIAGNOSIS — I739 Peripheral vascular disease, unspecified: Secondary | ICD-10-CM | POA: Diagnosis not present

## 2021-03-01 DIAGNOSIS — R69 Illness, unspecified: Secondary | ICD-10-CM | POA: Diagnosis not present

## 2021-03-01 DIAGNOSIS — E1122 Type 2 diabetes mellitus with diabetic chronic kidney disease: Secondary | ICD-10-CM | POA: Diagnosis not present

## 2021-03-01 DIAGNOSIS — D631 Anemia in chronic kidney disease: Secondary | ICD-10-CM | POA: Diagnosis present

## 2021-03-01 DIAGNOSIS — L089 Local infection of the skin and subcutaneous tissue, unspecified: Secondary | ICD-10-CM | POA: Diagnosis present

## 2021-03-01 DIAGNOSIS — I10 Essential (primary) hypertension: Secondary | ICD-10-CM | POA: Diagnosis not present

## 2021-03-01 DIAGNOSIS — H543 Unqualified visual loss, both eyes: Secondary | ICD-10-CM | POA: Diagnosis not present

## 2021-03-01 DIAGNOSIS — I110 Hypertensive heart disease with heart failure: Secondary | ICD-10-CM | POA: Diagnosis not present

## 2021-03-01 DIAGNOSIS — E440 Moderate protein-calorie malnutrition: Secondary | ICD-10-CM | POA: Diagnosis not present

## 2021-03-01 DIAGNOSIS — Z20822 Contact with and (suspected) exposure to covid-19: Secondary | ICD-10-CM | POA: Diagnosis not present

## 2021-03-01 DIAGNOSIS — E1165 Type 2 diabetes mellitus with hyperglycemia: Secondary | ICD-10-CM | POA: Diagnosis not present

## 2021-03-01 DIAGNOSIS — E871 Hypo-osmolality and hyponatremia: Secondary | ICD-10-CM | POA: Diagnosis not present

## 2021-03-01 DIAGNOSIS — I70262 Atherosclerosis of native arteries of extremities with gangrene, left leg: Secondary | ICD-10-CM | POA: Diagnosis not present

## 2021-03-01 DIAGNOSIS — S98912A Complete traumatic amputation of left foot, level unspecified, initial encounter: Secondary | ICD-10-CM | POA: Diagnosis not present

## 2021-03-01 DIAGNOSIS — H409 Unspecified glaucoma: Secondary | ICD-10-CM | POA: Diagnosis not present

## 2021-03-01 DIAGNOSIS — F319 Bipolar disorder, unspecified: Secondary | ICD-10-CM | POA: Diagnosis present

## 2021-03-01 DIAGNOSIS — Z79899 Other long term (current) drug therapy: Secondary | ICD-10-CM | POA: Diagnosis not present

## 2021-03-01 DIAGNOSIS — Z8673 Personal history of transient ischemic attack (TIA), and cerebral infarction without residual deficits: Secondary | ICD-10-CM | POA: Diagnosis not present

## 2021-03-01 DIAGNOSIS — E1169 Type 2 diabetes mellitus with other specified complication: Secondary | ICD-10-CM | POA: Diagnosis present

## 2021-03-01 DIAGNOSIS — I69351 Hemiplegia and hemiparesis following cerebral infarction affecting right dominant side: Secondary | ICD-10-CM | POA: Diagnosis not present

## 2021-03-01 DIAGNOSIS — I130 Hypertensive heart and chronic kidney disease with heart failure and stage 1 through stage 4 chronic kidney disease, or unspecified chronic kidney disease: Secondary | ICD-10-CM | POA: Diagnosis not present

## 2021-03-01 DIAGNOSIS — R5381 Other malaise: Secondary | ICD-10-CM | POA: Diagnosis not present

## 2021-03-01 DIAGNOSIS — Z87891 Personal history of nicotine dependence: Secondary | ICD-10-CM | POA: Diagnosis not present

## 2021-03-01 DIAGNOSIS — I70222 Atherosclerosis of native arteries of extremities with rest pain, left leg: Secondary | ICD-10-CM | POA: Diagnosis not present

## 2021-03-01 DIAGNOSIS — E1152 Type 2 diabetes mellitus with diabetic peripheral angiopathy with gangrene: Secondary | ICD-10-CM | POA: Diagnosis not present

## 2021-03-01 DIAGNOSIS — E8721 Acute metabolic acidosis: Secondary | ICD-10-CM | POA: Diagnosis not present

## 2021-03-01 DIAGNOSIS — I2721 Secondary pulmonary arterial hypertension: Secondary | ICD-10-CM | POA: Diagnosis present

## 2021-03-01 DIAGNOSIS — N1831 Chronic kidney disease, stage 3a: Secondary | ICD-10-CM | POA: Diagnosis not present

## 2021-03-01 DIAGNOSIS — Z7189 Other specified counseling: Secondary | ICD-10-CM | POA: Diagnosis not present

## 2021-03-01 DIAGNOSIS — I96 Gangrene, not elsewhere classified: Secondary | ICD-10-CM | POA: Diagnosis not present

## 2021-03-01 DIAGNOSIS — L0889 Other specified local infections of the skin and subcutaneous tissue: Secondary | ICD-10-CM | POA: Diagnosis not present

## 2021-03-01 DIAGNOSIS — I959 Hypotension, unspecified: Secondary | ICD-10-CM | POA: Diagnosis not present

## 2021-03-01 DIAGNOSIS — E11621 Type 2 diabetes mellitus with foot ulcer: Secondary | ICD-10-CM | POA: Diagnosis present

## 2021-03-01 DIAGNOSIS — I509 Heart failure, unspecified: Secondary | ICD-10-CM | POA: Diagnosis not present

## 2021-03-01 DIAGNOSIS — E11628 Type 2 diabetes mellitus with other skin complications: Secondary | ICD-10-CM | POA: Diagnosis not present

## 2021-03-01 DIAGNOSIS — F05 Delirium due to known physiological condition: Secondary | ICD-10-CM | POA: Diagnosis not present

## 2021-03-01 DIAGNOSIS — I2510 Atherosclerotic heart disease of native coronary artery without angina pectoris: Secondary | ICD-10-CM | POA: Diagnosis not present

## 2021-03-01 DIAGNOSIS — M86172 Other acute osteomyelitis, left ankle and foot: Secondary | ICD-10-CM | POA: Diagnosis not present

## 2021-03-01 DIAGNOSIS — T797XXA Traumatic subcutaneous emphysema, initial encounter: Secondary | ICD-10-CM | POA: Diagnosis not present

## 2021-03-01 DIAGNOSIS — Z515 Encounter for palliative care: Secondary | ICD-10-CM | POA: Diagnosis not present

## 2021-03-01 DIAGNOSIS — E669 Obesity, unspecified: Secondary | ICD-10-CM | POA: Diagnosis present

## 2021-03-01 DIAGNOSIS — E1142 Type 2 diabetes mellitus with diabetic polyneuropathy: Secondary | ICD-10-CM | POA: Diagnosis present

## 2021-03-01 DIAGNOSIS — N183 Chronic kidney disease, stage 3 unspecified: Secondary | ICD-10-CM | POA: Diagnosis not present

## 2021-03-01 HISTORY — PX: TRANSMETATARSAL AMPUTATION: SHX6197

## 2021-03-01 LAB — CBC WITH DIFFERENTIAL/PLATELET
Abs Immature Granulocytes: 0.13 10*3/uL — ABNORMAL HIGH (ref 0.00–0.07)
Basophils Absolute: 0 10*3/uL (ref 0.0–0.1)
Basophils Relative: 0 %
Eosinophils Absolute: 0.1 10*3/uL (ref 0.0–0.5)
Eosinophils Relative: 1 %
HCT: 26 % — ABNORMAL LOW (ref 39.0–52.0)
Hemoglobin: 8 g/dL — ABNORMAL LOW (ref 13.0–17.0)
Immature Granulocytes: 1 %
Lymphocytes Relative: 15 %
Lymphs Abs: 1.8 10*3/uL (ref 0.7–4.0)
MCH: 23.2 pg — ABNORMAL LOW (ref 26.0–34.0)
MCHC: 30.8 g/dL (ref 30.0–36.0)
MCV: 75.4 fL — ABNORMAL LOW (ref 80.0–100.0)
Monocytes Absolute: 1 10*3/uL (ref 0.1–1.0)
Monocytes Relative: 9 %
Neutro Abs: 8.9 10*3/uL — ABNORMAL HIGH (ref 1.7–7.7)
Neutrophils Relative %: 74 %
Platelets: 391 10*3/uL (ref 150–400)
RBC: 3.45 MIL/uL — ABNORMAL LOW (ref 4.22–5.81)
RDW: 14.6 % (ref 11.5–15.5)
WBC: 12 10*3/uL — ABNORMAL HIGH (ref 4.0–10.5)
nRBC: 0 % (ref 0.0–0.2)

## 2021-03-01 LAB — COMPREHENSIVE METABOLIC PANEL
ALT: 14 U/L (ref 0–44)
AST: 11 U/L — ABNORMAL LOW (ref 15–41)
Albumin: 2.1 g/dL — ABNORMAL LOW (ref 3.5–5.0)
Alkaline Phosphatase: 107 U/L (ref 38–126)
Anion gap: 12 (ref 5–15)
BUN: 87 mg/dL — ABNORMAL HIGH (ref 6–20)
CO2: 16 mmol/L — ABNORMAL LOW (ref 22–32)
Calcium: 8.3 mg/dL — ABNORMAL LOW (ref 8.9–10.3)
Chloride: 101 mmol/L (ref 98–111)
Creatinine, Ser: 2.33 mg/dL — ABNORMAL HIGH (ref 0.61–1.24)
GFR, Estimated: 31 mL/min — ABNORMAL LOW (ref >60)
Glucose, Bld: 210 mg/dL — ABNORMAL HIGH (ref 70–99)
Potassium: 4.1 mmol/L (ref 3.5–5.1)
Sodium: 129 mmol/L — ABNORMAL LOW (ref 135–145)
Total Bilirubin: 0.9 mg/dL (ref 0.3–1.2)
Total Protein: 7.2 g/dL (ref 6.5–8.1)

## 2021-03-01 LAB — LACTIC ACID, PLASMA: Lactic Acid, Venous: 0.6 mmol/L (ref 0.5–1.9)

## 2021-03-01 LAB — RESP PANEL BY RT-PCR (FLU A&B, COVID) ARPGX2
Influenza A by PCR: NEGATIVE
Influenza B by PCR: NEGATIVE
SARS Coronavirus 2 by RT PCR: NEGATIVE

## 2021-03-01 LAB — SEDIMENTATION RATE: Sed Rate: 124 mm/hr — ABNORMAL HIGH (ref 0–16)

## 2021-03-01 LAB — HIV ANTIBODY (ROUTINE TESTING W REFLEX): HIV Screen 4th Generation wRfx: NONREACTIVE

## 2021-03-01 LAB — HEMOGLOBIN A1C
Hgb A1c MFr Bld: 8.4 % — ABNORMAL HIGH (ref 4.8–5.6)
Mean Plasma Glucose: 194.38 mg/dL

## 2021-03-01 LAB — GLUCOSE, CAPILLARY
Glucose-Capillary: 181 mg/dL — ABNORMAL HIGH (ref 70–99)
Glucose-Capillary: 163 mg/dL — ABNORMAL HIGH (ref 70–99)
Glucose-Capillary: 156 mg/dL — ABNORMAL HIGH (ref 70–99)
Glucose-Capillary: 141 mg/dL — ABNORMAL HIGH (ref 70–99)

## 2021-03-01 LAB — C-REACTIVE PROTEIN: CRP: 18.7 mg/dL — ABNORMAL HIGH (ref <1.0)

## 2021-03-01 LAB — PREALBUMIN: Prealbumin: 7.9 mg/dL — ABNORMAL LOW (ref 18–38)

## 2021-03-01 LAB — CBG MONITORING, ED: Glucose-Capillary: 177 mg/dL — ABNORMAL HIGH (ref 70–99)

## 2021-03-01 SURGERY — AMPUTATION, FOOT, TRANSMETATARSAL
Anesthesia: Monitor Anesthesia Care | Site: Toe | Laterality: Left

## 2021-03-01 MED ORDER — BRIMONIDINE TARTRATE 0.2 % OP SOLN
1.0000 [drp] | Freq: Two times a day (BID) | OPHTHALMIC | Status: DC
  Administered 2021-03-01 – 2021-03-12 (×22): 1 [drp] via OPHTHALMIC
  Filled 2021-03-01 (×2): qty 5

## 2021-03-01 MED ORDER — ISOSORB DINITRATE-HYDRALAZINE 20-37.5 MG PO TABS
1.0000 | ORAL_TABLET | Freq: Three times a day (TID) | ORAL | Status: DC
  Administered 2021-03-01 – 2021-03-02 (×3): 1 via ORAL
  Filled 2021-03-01 (×3): qty 1

## 2021-03-01 MED ORDER — ATORVASTATIN CALCIUM 80 MG PO TABS
80.0000 mg | ORAL_TABLET | Freq: Every day | ORAL | Status: DC
  Administered 2021-03-01 – 2021-03-12 (×12): 80 mg via ORAL
  Filled 2021-03-01 (×12): qty 1

## 2021-03-01 MED ORDER — PROPOFOL 500 MG/50ML IV EMUL
INTRAVENOUS | Status: DC | PRN
Start: 2021-03-01 — End: 2021-03-01
  Administered 2021-03-01: 75 ug/kg/min via INTRAVENOUS

## 2021-03-01 MED ORDER — ACETAMINOPHEN 160 MG/5ML PO SOLN: 1000.0000 mg | Freq: Once | ORAL | Status: DC | PRN

## 2021-03-01 MED ORDER — ONDANSETRON HCL 4 MG/2ML IJ SOLN
4.0000 mg | Freq: Four times a day (QID) | INTRAMUSCULAR | Status: DC | PRN
  Administered 2021-03-07: 4 mg via INTRAVENOUS

## 2021-03-01 MED ORDER — SODIUM CHLORIDE 0.9 % IV SOLN
2.0000 g | INTRAVENOUS | Status: AC
  Administered 2021-03-01 – 2021-03-06 (×5): 2 g via INTRAVENOUS
  Filled 2021-03-01 (×6): qty 20

## 2021-03-01 MED ORDER — PREGABALIN 75 MG PO CAPS
75.0000 mg | ORAL_CAPSULE | Freq: Two times a day (BID) | ORAL | Status: DC
  Administered 2021-03-01 – 2021-03-12 (×22): 75 mg via ORAL
  Filled 2021-03-01 (×23): qty 1

## 2021-03-01 MED ORDER — LORAZEPAM 2 MG/ML IJ SOLN
0.5000 mg | Freq: Once | INTRAMUSCULAR | Status: DC | PRN
  Filled 2021-03-01: qty 1

## 2021-03-01 MED ORDER — ACETAMINOPHEN 650 MG RE SUPP: 650.0000 mg | Freq: Four times a day (QID) | RECTAL | Status: DC | PRN

## 2021-03-01 MED ORDER — CLINDAMYCIN PHOSPHATE 900 MG/50ML IV SOLN
900.0000 mg | Freq: Once | INTRAVENOUS | Status: AC
  Administered 2021-03-01: 900 mg via INTRAVENOUS
  Filled 2021-03-01: qty 50

## 2021-03-01 MED ORDER — DORZOLAMIDE HCL 2 % OP SOLN
1.0000 [drp] | Freq: Two times a day (BID) | OPHTHALMIC | Status: DC
  Administered 2021-03-01 – 2021-03-12 (×22): 1 [drp] via OPHTHALMIC
  Filled 2021-03-01 (×2): qty 10

## 2021-03-01 MED ORDER — PROPOFOL 10 MG/ML IV BOLUS
INTRAVENOUS | Status: AC
  Filled 2021-03-01: qty 20

## 2021-03-01 MED ORDER — ONDANSETRON HCL 4 MG PO TABS: 4.0000 mg | ORAL_TABLET | Freq: Four times a day (QID) | ORAL | Status: DC | PRN

## 2021-03-01 MED ORDER — INSULIN ASPART 100 UNIT/ML IJ SOLN
0.0000 [IU] | Freq: Three times a day (TID) | INTRAMUSCULAR | Status: DC
  Administered 2021-03-01 – 2021-03-03 (×5): 2 [IU] via SUBCUTANEOUS
  Administered 2021-03-04 – 2021-03-05 (×6): 1 [IU] via SUBCUTANEOUS
  Administered 2021-03-06 (×2): 2 [IU] via SUBCUTANEOUS
  Administered 2021-03-06: 1 [IU] via SUBCUTANEOUS
  Administered 2021-03-07 – 2021-03-12 (×8): 2 [IU] via SUBCUTANEOUS

## 2021-03-01 MED ORDER — FENTANYL CITRATE (PF) 100 MCG/2ML IJ SOLN: 25.0000 ug | INTRAMUSCULAR | Status: DC | PRN

## 2021-03-01 MED ORDER — POLYETHYLENE GLYCOL 3350 17 G PO PACK: 17.0000 g | PACK | Freq: Every day | ORAL | Status: DC | PRN

## 2021-03-01 MED ORDER — METRONIDAZOLE 500 MG/100ML IV SOLN
500.0000 mg | Freq: Three times a day (TID) | INTRAVENOUS | Status: DC
  Administered 2021-03-01 – 2021-03-03 (×5): 500 mg via INTRAVENOUS
  Filled 2021-03-01 (×5): qty 100

## 2021-03-01 MED ORDER — MIDAZOLAM HCL 2 MG/2ML IJ SOLN
INTRAMUSCULAR | Status: AC
  Filled 2021-03-01: qty 2

## 2021-03-01 MED ORDER — FENTANYL CITRATE PF 50 MCG/ML IJ SOSY
50.0000 ug | PREFILLED_SYRINGE | Freq: Once | INTRAMUSCULAR | Status: AC
  Administered 2021-03-01: 50 ug via INTRAVENOUS

## 2021-03-01 MED ORDER — PIPERACILLIN-TAZOBACTAM 3.375 G IVPB 30 MIN
3.3750 g | Freq: Once | INTRAVENOUS | Status: AC
  Administered 2021-03-01: 3.375 g via INTRAVENOUS
  Filled 2021-03-01: qty 50

## 2021-03-01 MED ORDER — LACTATED RINGERS IV SOLN: INTRAVENOUS | Status: DC

## 2021-03-01 MED ORDER — OXYCODONE HCL 5 MG PO TABS
5.0000 mg | ORAL_TABLET | ORAL | Status: DC | PRN
  Administered 2021-03-02 – 2021-03-12 (×5): 5 mg via ORAL
  Filled 2021-03-01 (×5): qty 1

## 2021-03-01 MED ORDER — BUPIVACAINE HCL (PF) 0.5 % IJ SOLN
INTRAMUSCULAR | Status: AC
  Filled 2021-03-01: qty 30

## 2021-03-01 MED ORDER — SODIUM CHLORIDE 0.9 % IV SOLN
INTRAVENOUS | Status: DC
  Administered 2021-03-02: 10 mL/h via INTRAVENOUS

## 2021-03-01 MED ORDER — PROPOFOL 10 MG/ML IV BOLUS
INTRAVENOUS | Status: DC | PRN
  Administered 2021-03-01: 10 mg via INTRAVENOUS

## 2021-03-01 MED ORDER — TAMSULOSIN HCL 0.4 MG PO CAPS
0.8000 mg | ORAL_CAPSULE | Freq: Every day | ORAL | Status: DC
Start: 2021-03-02 — End: 2021-03-13
  Administered 2021-03-02 – 2021-03-12 (×10): 0.8 mg via ORAL
  Filled 2021-03-01 (×11): qty 2

## 2021-03-01 MED ORDER — FENTANYL CITRATE PF 50 MCG/ML IJ SOSY
12.5000 ug | PREFILLED_SYRINGE | INTRAMUSCULAR | Status: DC | PRN
  Administered 2021-03-05: 50 ug via INTRAVENOUS
  Filled 2021-03-01: qty 1

## 2021-03-01 MED ORDER — LIDOCAINE HCL 2 % IJ SOLN
INTRAMUSCULAR | Status: AC
  Filled 2021-03-01: qty 20

## 2021-03-01 MED ORDER — LACTATED RINGERS IV BOLUS
1000.0000 mL | Freq: Once | INTRAVENOUS | Status: AC
  Administered 2021-03-01: 1000 mL via INTRAVENOUS

## 2021-03-01 MED ORDER — SACCHAROMYCES BOULARDII 250 MG PO CAPS
250.0000 mg | ORAL_CAPSULE | Freq: Two times a day (BID) | ORAL | Status: DC
  Administered 2021-03-01 – 2021-03-12 (×22): 250 mg via ORAL
  Filled 2021-03-01 (×23): qty 1

## 2021-03-01 MED ORDER — FENTANYL CITRATE (PF) 250 MCG/5ML IJ SOLN
INTRAMUSCULAR | Status: AC
  Filled 2021-03-01: qty 5

## 2021-03-01 MED ORDER — ACETAMINOPHEN 325 MG PO TABS
650.0000 mg | ORAL_TABLET | Freq: Four times a day (QID) | ORAL | Status: DC | PRN
  Administered 2021-03-03 – 2021-03-12 (×3): 650 mg via ORAL
  Filled 2021-03-01 (×3): qty 2

## 2021-03-01 MED ORDER — 0.9 % SODIUM CHLORIDE (POUR BTL) OPTIME
TOPICAL | Status: DC | PRN
  Administered 2021-03-01: 1000 mL

## 2021-03-01 MED ORDER — HEPARIN SODIUM (PORCINE) 5000 UNIT/ML IJ SOLN
5000.0000 [IU] | Freq: Three times a day (TID) | INTRAMUSCULAR | Status: DC
  Administered 2021-03-01 – 2021-03-12 (×33): 5000 [IU] via SUBCUTANEOUS
  Filled 2021-03-01 (×32): qty 1

## 2021-03-01 MED ORDER — SODIUM CHLORIDE 0.9 % IV SOLN: INTRAVENOUS | Status: DC

## 2021-03-01 MED ORDER — AMLODIPINE BESYLATE 10 MG PO TABS
10.0000 mg | ORAL_TABLET | Freq: Every day | ORAL | Status: DC
  Administered 2021-03-02 – 2021-03-03 (×2): 10 mg via ORAL
  Filled 2021-03-01 (×2): qty 1

## 2021-03-01 MED ORDER — SERTRALINE HCL 50 MG PO TABS
50.0000 mg | ORAL_TABLET | Freq: Every day | ORAL | Status: DC
  Administered 2021-03-01 – 2021-03-12 (×12): 50 mg via ORAL
  Filled 2021-03-01 (×12): qty 1

## 2021-03-01 MED ORDER — OXYCODONE HCL 5 MG PO TABS: 5.0000 mg | ORAL_TABLET | Freq: Once | ORAL | Status: DC | PRN

## 2021-03-01 MED ORDER — ROPIVACAINE HCL 5 MG/ML IJ SOLN
INTRAMUSCULAR | Status: DC | PRN
  Administered 2021-03-01: 20 mL via PERINEURAL

## 2021-03-01 MED ORDER — ACETAMINOPHEN 10 MG/ML IV SOLN: 1000.0000 mg | Freq: Once | INTRAVENOUS | Status: DC | PRN

## 2021-03-01 MED ORDER — SENNOSIDES-DOCUSATE SODIUM 8.6-50 MG PO TABS
1.0000 | ORAL_TABLET | Freq: Every day | ORAL | Status: DC
  Administered 2021-03-02 – 2021-03-10 (×4): 1 via ORAL
  Filled 2021-03-01 (×7): qty 1

## 2021-03-01 MED ORDER — CHLORHEXIDINE GLUCONATE 0.12 % MT SOLN
15.0000 mL | Freq: Once | OROMUCOSAL | Status: AC
  Administered 2021-03-01: 15 mL via OROMUCOSAL

## 2021-03-01 MED ORDER — BISACODYL 5 MG PO TBEC: 5.0000 mg | DELAYED_RELEASE_TABLET | Freq: Every day | ORAL | Status: DC | PRN

## 2021-03-01 MED ORDER — HYDRALAZINE HCL 20 MG/ML IJ SOLN: 5.0000 mg | INTRAMUSCULAR | Status: DC | PRN

## 2021-03-01 MED ORDER — ACETAMINOPHEN 500 MG PO TABS: 1000.0000 mg | ORAL_TABLET | Freq: Once | ORAL | Status: DC | PRN

## 2021-03-01 MED ORDER — ALBUMIN HUMAN 5 % IV SOLN
12.5000 g | Freq: Once | INTRAVENOUS | Status: AC
  Administered 2021-03-01: 12.5 g via INTRAVENOUS

## 2021-03-01 MED ORDER — ORAL CARE MOUTH RINSE: 15.0000 mL | Freq: Once | OROMUCOSAL | Status: AC

## 2021-03-01 MED ORDER — VANCOMYCIN VARIABLE DOSE PER UNSTABLE RENAL FUNCTION (PHARMACIST DOSING): Status: DC

## 2021-03-01 MED ORDER — OXYCODONE HCL 5 MG/5ML PO SOLN: 5.0000 mg | Freq: Once | ORAL | Status: DC | PRN

## 2021-03-01 MED ORDER — PANTOPRAZOLE SODIUM 40 MG PO TBEC
40.0000 mg | DELAYED_RELEASE_TABLET | Freq: Every day | ORAL | Status: DC
  Administered 2021-03-02 – 2021-03-12 (×10): 40 mg via ORAL
  Filled 2021-03-01 (×11): qty 1

## 2021-03-01 MED ORDER — FENTANYL CITRATE (PF) 100 MCG/2ML IJ SOLN
INTRAMUSCULAR | Status: AC
  Filled 2021-03-01: qty 2

## 2021-03-01 MED ORDER — INSULIN ASPART 100 UNIT/ML IJ SOLN: 0.0000 [IU] | Freq: Every day | INTRAMUSCULAR | Status: DC

## 2021-03-01 MED ORDER — BUPIVACAINE-EPINEPHRINE (PF) 0.5% -1:200000 IJ SOLN
INTRAMUSCULAR | Status: DC | PRN
Start: 2021-03-01 — End: 2021-03-01
  Administered 2021-03-01: 30 mL via PERINEURAL

## 2021-03-01 MED ORDER — ALBUMIN HUMAN 5 % IV SOLN
INTRAVENOUS | Status: AC
  Filled 2021-03-01: qty 250

## 2021-03-01 MED ORDER — BUPIVACAINE-EPINEPHRINE (PF) 0.5% -1:200000 IJ SOLN: INTRAMUSCULAR | Status: DC | PRN

## 2021-03-01 MED ORDER — VANCOMYCIN HCL 2000 MG/400ML IV SOLN
2000.0000 mg | Freq: Once | INTRAVENOUS | Status: AC
  Administered 2021-03-01: 2000 mg via INTRAVENOUS
  Filled 2021-03-01: qty 400

## 2021-03-01 SURGICAL SUPPLY — 43 items
BAG COUNTER SPONGE SURGICOUNT (BAG) ×2 IMPLANT
BAG SPNG CNTER NS LX DISP (BAG) ×1
BLADE LONG MED 31X9 (MISCELLANEOUS) IMPLANT
BNDG ADH 5X3 H2O RPLNT NS (GAUZE/BANDAGES/DRESSINGS) ×1
BNDG CMPR 9X4 STRL LF SNTH (GAUZE/BANDAGES/DRESSINGS) ×1
BNDG COHESIVE 3X5 WHT NS (GAUZE/BANDAGES/DRESSINGS) ×1 IMPLANT
BNDG CONFORM 2 STRL LF (GAUZE/BANDAGES/DRESSINGS) ×2 IMPLANT
BNDG ELASTIC 3X5.8 VLCR STR LF (GAUZE/BANDAGES/DRESSINGS) ×2 IMPLANT
BNDG ESMARK 4X9 LF (GAUZE/BANDAGES/DRESSINGS) ×2 IMPLANT
BNDG GAUZE ELAST 4 BULKY (GAUZE/BANDAGES/DRESSINGS) ×2 IMPLANT
BRUSH SCRUB EZ PLAIN DRY (MISCELLANEOUS) ×2 IMPLANT
COVER SURGICAL LIGHT HANDLE (MISCELLANEOUS) ×1 IMPLANT
CUFF TOURN SGL QUICK 18X4 (TOURNIQUET CUFF) IMPLANT
DRAIN CHANNEL 19F RND (DRAIN) ×1 IMPLANT
DRSG EMULSION OIL 3X3 NADH (GAUZE/BANDAGES/DRESSINGS) ×2 IMPLANT
DURAPREP 26ML APPLICATOR (WOUND CARE) ×2 IMPLANT
ELECT REM PT RETURN 9FT ADLT (ELECTROSURGICAL) ×2
ELECTRODE REM PT RTRN 9FT ADLT (ELECTROSURGICAL) ×1 IMPLANT
EVACUATOR SILICONE 100CC (DRAIN) ×1 IMPLANT
GAUZE SPONGE 4X4 12PLY STRL (GAUZE/BANDAGES/DRESSINGS) ×2 IMPLANT
GLOVE SURG ENC MOIS LTX SZ8 (GLOVE) ×4 IMPLANT
GLOVE SURG UNDER POLY LF SZ6 (GLOVE) ×1 IMPLANT
GLOVE SURG UNDER POLY LF SZ6.5 (GLOVE) ×1 IMPLANT
GLOVE SURG UNDER POLY LF SZ7 (GLOVE) ×1 IMPLANT
GOWN STRL REUS W/ TWL LRG LVL3 (GOWN DISPOSABLE) ×1 IMPLANT
GOWN STRL REUS W/ TWL XL LVL3 (GOWN DISPOSABLE) ×1 IMPLANT
GOWN STRL REUS W/TWL LRG LVL3 (GOWN DISPOSABLE) ×2
GOWN STRL REUS W/TWL XL LVL3 (GOWN DISPOSABLE) ×2
KIT BASIN OR (CUSTOM PROCEDURE TRAY) ×2 IMPLANT
NDL HYPO 25X1 1.5 SAFETY (NEEDLE) ×1 IMPLANT
NEEDLE HYPO 25X1 1.5 SAFETY (NEEDLE) ×2 IMPLANT
NS IRRIG 1000ML POUR BTL (IV SOLUTION) IMPLANT
PACK ORTHO EXTREMITY (CUSTOM PROCEDURE TRAY) ×2 IMPLANT
SUCTION FRAZIER HANDLE 10FR (MISCELLANEOUS) ×2
SUCTION TUBE FRAZIER 10FR DISP (MISCELLANEOUS) ×1 IMPLANT
SUT ETHILON 2 0 FS 18 (SUTURE) ×1 IMPLANT
SUT PROLENE 3 0 PS 2 (SUTURE) ×4 IMPLANT
SWAB COLLECTION DEVICE MRSA (MISCELLANEOUS) ×1 IMPLANT
SWAB CULTURE ESWAB REG 1ML (MISCELLANEOUS) ×1 IMPLANT
SYR 10ML LL (SYRINGE) IMPLANT
TUBE CONNECTING 12X1/4 (SUCTIONS) ×2 IMPLANT
UNDERPAD 30X36 HEAVY ABSORB (UNDERPADS AND DIAPERS) ×2 IMPLANT
YANKAUER SUCT BULB TIP NO VENT (SUCTIONS) IMPLANT

## 2021-03-01 NOTE — Progress Notes (Signed)
Mobility Specialist Progress Note   03/01/21 1911  Mobility  Activity Repositioned in chair (Repositioned in bed*)  Level of Assistance +2 (takes two people)  Assistive Device None (HHA)  LLE Weight Bearing NWB  Mobility Response Tolerated fair  Mobility performed by Mobility specialist;Nurse  $Mobility charge 1 Mobility   Pt requesting to be reposition in bed. Assisted RN w/ repositioning,  pt tolerating fairly after grimacing w/ slight shifts in the bed. Left w/ RN in the room.  [name] Mobility Specialist Phone Number [phone]

## 2021-03-01 NOTE — Progress Notes (Signed)
Pharmacy Antibiotic Note  [name] is a 61 y.o. male admitted on 02/28/2021 with LE diabetic wound infection .  Pharmacy has been consulted for Vancomycin dosing. 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   s/p TMA 1/5. Drain in place. May still need to return for further I&D.   Acute renal failure on CKD stage 3a. SCr 2.33.   Vancomycin 2000 mg IV x1 was given this AM at 0523 AM.  No futher dose needed today.   Plan: Vanc 2000mg  IV x1 given at 0523 AM F/u bmet in AM for further vancomycin dosing  Height: 5\' 8"  (172.7 cm) Weight: 91.2 kg (201 lb) IBW/kg (Calculated) : 68.4  Temp (24hrs), Avg:98.2 F (36.8 C), Min:97.8 F (36.6 C), Max:98.9 F (37.2 C)  Recent Labs  Lab 02/28/21 2350 03/01/21 0021  WBC 12.0*  --   CREATININE 2.33*  --   LATICACIDVEN  --  0.6    Estimated Creatinine Clearance: 37.4 mL/min (A) (by C-G formula based on SCr of 2.33 mg/dL (H)).    Allergies  Allergen Reactions   Other Anaphylaxis, Itching, Swelling and Other (See Comments)    Allergy to GRITS- exact ingredient is not known    Antimicrobials this admission: Vanc 1/5 >> Zosyn 1/5 x1 Clindamycin 1/5 x1 Flagyl 1/5>>1/11 Ceftriaxone 1/5>1/11   Dose adjustments this admission:    Microbiology results:  1/5 blood>> 1/5 wound>> COVID/flu neg   Thank you for allowing pharmacy to be a part of this patients care.  Nicole Cella, RPh Clinical Pharmacist 03/01/2021 5:18 PM Please check AMION for all [place] phone numbers After 10:00 PM, call [facility] [phone]

## 2021-03-01 NOTE — Anesthesia Preprocedure Evaluation (Addendum)
Anesthesia Evaluation  Patient identified by MRN, date of birth, ID band Patient awake    Reviewed: Allergy & Precautions, NPO status , Patient's Chart, lab work & pertinent test results  Airway Mallampati: III  TM Distance: >3 FB Neck ROM: Full    Dental  (+) Dental Advisory Given   Pulmonary former smoker,    breath sounds clear to auscultation       Cardiovascular hypertension, + CAD and +CHF   Rhythm:Regular Rate:Normal     Neuro/Psych TIACVA    GI/Hepatic negative GI ROS, Neg liver ROS,   Endo/Other  diabetes, Type 2, Insulin Dependent  Renal/GU CRFRenal disease     Musculoskeletal   Abdominal   Peds  Hematology  (+) anemia ,   Anesthesia Other Findings   Reproductive/Obstetrics                            Anesthesia Physical Anesthesia Plan  ASA: 4  Anesthesia Plan: Regional and MAC   Post-op Pain Management: Regional block and Minimal or no pain anticipated   Induction: Intravenous  PONV Risk Score and Plan: 1 and Propofol infusion, Ondansetron and Treatment may vary due to age or medical condition  Airway Management Planned: Natural Airway and Simple Face Mask  Additional Equipment:   Intra-op Plan:   Post-operative Plan:   Informed Consent: I have reviewed the patients History and Physical, chart, labs and discussed the procedure including the risks, benefits and alternatives for the proposed anesthesia with the patient or authorized representative who has indicated his/her understanding and acceptance.       Plan Discussed with: CRNA  Anesthesia Plan Comments:         Anesthesia Quick Evaluation

## 2021-03-01 NOTE — Assessment & Plan Note (Addendum)
-  Appears to be compensated at this time -Would recommend ongoing monitoring for evidence of volume overload -Holding Demadex, Entresto for now -Continue Bidil

## 2021-03-01 NOTE — Transfer of Care (Signed)
Immediate Anesthesia Transfer of Care Note  Patient: [name]  Procedure(s) Performed: TRANSMETATARSAL AMPUTATION (Left: Toe)  Patient Location: PACU  Anesthesia Type:MAC combined with regional for post-op pain  Level of Consciousness: drowsy and patient cooperative  Airway & Oxygen Therapy: Patient Spontanous Breathing and Patient connected to nasal cannula oxygen  Post-op Assessment: Report given to RN and Post -op Vital signs reviewed and stable  Post vital signs: Reviewed and stable  Last Vitals:  Vitals Value Taken Time  BP 78/53 03/01/21 1338  Temp    Pulse 82 03/01/21 1339  Resp 16 03/01/21 1339  SpO2 100 % 03/01/21 1339  Vitals shown include unvalidated device data.  Last Pain:  Vitals:   03/01/21 1215  TempSrc:   PainSc: 0-No pain         Complications: No notable events documented.

## 2021-03-01 NOTE — ED Notes (Addendum)
Warm blanket given

## 2021-03-01 NOTE — Assessment & Plan Note (Addendum)
-  ABIs were performed and showed noncompressible RLE arteries and moderate LLE arterial disease -For aortogram tomorrow with Dr. Stanford Breed -Resume ASA when ok with vascular and podiatry

## 2021-03-01 NOTE — Anesthesia Procedure Notes (Addendum)
Anesthesia Regional Block: Popliteal block   Pre-Anesthetic Checklist: , timeout performed,  Correct Patient, Correct Site, Correct Laterality,  Correct Procedure, Correct Position, site marked,  Risks and benefits discussed,  Surgical consent,  Pre-op evaluation,  At surgeon's request and post-op pain management  Laterality: Left  Prep: chloraprep       Needles:  Injection technique: Single-shot  Needle Type: Echogenic Needle     Needle Length: 9cm  Needle Gauge: 21     Additional Needles:   Procedures:,,,, ultrasound used (permanent image in chart),,    Narrative:  Start time: 03/01/2021 12:08 PM End time: 03/01/2021 12:13 PM Injection made incrementally with aspirations every 5 mL.  Performed by: Personally  Anesthesiologist: Suzette Battiest, MD

## 2021-03-01 NOTE — ED Notes (Signed)
Patient is resting comfortably. 

## 2021-03-01 NOTE — Progress Notes (Signed)
VASCULAR LAB    ABIs have been performed.  See CV proc for preliminary results.   Bailie Christenbury, RVT 03/01/2021, 9:03 AM

## 2021-03-01 NOTE — H&P (Signed)
History and Physical    Patient: [name] ZYS:[id number] DOB: 05-06-1960 DOA: 02/28/2021 DOS: the patient was seen and examined on 03/01/2021 PCP: Jodi Marble, MD  Patient coming from: SNF - [facility]; NOK: Abhijay, [place], [phone]   Chief Complaint: Foot infection  HPI: ERNAN RUNKLES is a 61 y.o. male with medical history significant of bipolar d/o; DM; HTN; and chronic diastolic CHF presenting with a foot infection.  He was unaware of an issue with his feet and denies pain but also denies sensation loss.  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      ER Course:  Carryover, per Dr. Alcario Drought:  necrotic foot / nec fasc.  EDP spoke with ortho who is going to take to OR in AM.   Review of Systems: As mentioned in the history of present illness. All other systems reviewed and are negative.  Poor historian so ROS was limited.  Past Medical History:  Diagnosis Date   Arthritis    Bipolar affective ([place])    CHF (congestive heart failure) ([place])    Claustrophobia    Severe   Depression    Diabetes mellitus    uncontrolled    Heart murmur    Hypertension    Past Surgical History:  Procedure Laterality Date   BOWEL DECOMPRESSION N/A 01/19/2020   Procedure: BOWEL DECOMPRESSION;  Surgeon: Otis Brace, MD;  Location: [place];  Service: Gastroenterology;  Laterality: N/A;   BOWEL DECOMPRESSION N/A 11/19/2020   Procedure: BOWEL DECOMPRESSION;  Surgeon: Wilford Corner, MD;  Location: [facility];  Service: Endoscopy;  Laterality: N/A;   BOWEL DECOMPRESSION N/A 12/03/2020   Procedure: BOWEL DECOMPRESSION;  Surgeon: Ronnette Juniper, MD;  Location: [facility];  Service: Gastroenterology;  Laterality: N/A;   COLONOSCOPY WITH PROPOFOL N/A 01/19/2020   Procedure: COLONOSCOPY WITH PROPOFOL;  Surgeon: Otis Brace, MD;  Location: [place];  Service: Gastroenterology;  Laterality: N/A;   COLONOSCOPY WITH PROPOFOL N/A 11/19/2020    Procedure: COLONOSCOPY WITH PROPOFOL;  Surgeon: Wilford Corner, MD;  Location: [facility];  Service: Endoscopy;  Laterality: N/A;   COLONOSCOPY WITH PROPOFOL N/A 12/03/2020   Procedure: COLONOSCOPY WITH PROPOFOL;  Surgeon: Ronnette Juniper, MD;  Location: [facility];  Service: Gastroenterology;  Laterality: N/A;   KNEE SURGERY     b/l knees; pending total knee    RADIOLOGY WITH ANESTHESIA N/A 07/04/2020   Procedure: MRI WITH ANESTHESIA BRAIN WITHOUT CONTRAST AND HEAD WITHOUT CONTRAST;  Surgeon: Radiologist, Medication, MD;  Location: [place];  Service: Radiology;  Laterality: N/A;   Social History:  reports that he has quit smoking. He has never used smokeless tobacco. He reports that he does not currently use drugs after having used the following drugs: Cocaine. He reports that he does not drink alcohol.  Allergies  Allergen Reactions   Other Anaphylaxis, Itching, Swelling and Other (See Comments)    Allergy to GRITS- exact ingredient is not known    Family History  Problem Relation Age of Onset   Diabetes Other    Cancer Mother        uterus; deceased    Heart attack Mother 10   Hypertension Other    Heart attack Father 65   Stroke Sister        12/2012 age 96 y.o    Prior to Admission medications   Medication Sig Start Date End Date Taking? Authorizing Provider  acetaminophen (TYLENOL) 650 MG CR tablet Take 650 mg by mouth every 4 (  four) hours as needed (for general discomfort).   Yes [provider]  amLODipine (NORVASC) 10 MG tablet Take 10 mg by mouth daily.   Yes [provider]  aspirin 81 MG chewable tablet Chew 81 mg by mouth daily.   Yes [provider]  atorvastatin (LIPITOR) 80 MG tablet Take 80 mg by mouth at bedtime.   Yes [provider]  brimonidine (ALPHAGAN) 0.2 % ophthalmic solution Place 1 drop into both eyes in the morning and at bedtime. 12/20/20  Yes Plunkett, Loree Fee, MD  dorzolamide (TRUSOPT) 2 % ophthalmic solution Place  1 drop into both eyes 2 (two) times daily. 12/20/20  Yes Plunkett, Loree Fee, MD  empagliflozin (JARDIANCE) 25 MG TABS tablet Take 25 mg by mouth in the morning.   Yes [provider]  ferrous sulfate 325 (65 FE) MG tablet Take 1 tablet (325 mg total) by mouth daily. 09/23/20  Yes [name], [name], DO  insulin glargine-yfgn (SEMGLEE) 100 UNIT/ML Pen Inject 14 Units into the skin 2 (two) times daily.   Yes [provider]  insulin lispro (HUMALOG) 100 UNIT/ML KwikPen Inject 0-10 Units into the skin See admin instructions. Inject 0-10 units into the skin three times a day before meals, per sliding scale: BGL 0-150 = 0 units; 151-200 = 2 units; 201-250 = 4 units; 251-300 = 6 units; 301-350 = 8 units; 351-400 = 10 units   Yes [provider]  isosorbide-hydrALAZINE (BIDIL) 20-37.5 MG tablet Take 1 tablet by mouth 3 (three) times daily. 09/07/20  Yes Antonieta Pert, MD  loperamide (IMODIUM) 2 MG capsule Take 1 capsule (2 mg total) by mouth every 6 (six) hours as needed for diarrhea or loose stools. 12/11/20  Yes Dwyane Dee, MD  Multiple Vitamin (MULTIVITAMIN) tablet Take 1 tablet by mouth daily.   Yes [provider]  ondansetron (ZOFRAN) 4 MG tablet Take 4 mg by mouth every 4 (four) hours as needed for nausea or vomiting.   Yes [provider]  pantoprazole (PROTONIX) 40 MG tablet Take 40 mg by mouth daily.   Yes [provider]  potassium chloride (KLOR-CON) 10 MEQ tablet Take 2 tablets (20 mEq total) by mouth daily. 12/11/20  Yes Dwyane Dee, MD  pregabalin (LYRICA) 75 MG capsule Take 75 mg by mouth 2 (two) times daily.   Yes [provider]  saccharomyces boulardii (FLORASTOR) 250 MG capsule Take 1 capsule (250 mg total) by mouth 2 (two) times daily. 09/07/20  Yes Kc, Maren Beach, MD  sacubitril-valsartan (ENTRESTO) 49-51 MG Take 1 tablet by mouth 2 (two) times daily.   Yes [provider]  senna-docusate (SENOKOT-S) 8.6-50 MG tablet  Take 1 tablet by mouth daily.   Yes [provider]  sertraline (ZOLOFT) 50 MG tablet Take 50 mg by mouth at bedtime.   Yes [provider]  Skin Protectants, Misc. ([place] MOIST BARRIER/ZINC) CREA Apply 1 application topically See admin instructions. Apply to bilateral shin, heels,left lower buttock skin tear. Place foam dressing over bilateral shins every day and night shift for wound care   Yes [provider]  tamsulosin (FLOMAX) 0.4 MG CAPS capsule Take 0.8 mg by mouth daily.   Yes [provider]  torsemide (DEMADEX) 20 MG tablet Take 40 mg by mouth 2 (two) times daily.   Yes [provider]  Vitamin D, Ergocalciferol, (DRISDOL) 1.25 MG (50000 UNIT) CAPS capsule Take 50,000 Units by mouth every 30 (thirty) days. 17th of each month   Yes [provider]  empagliflozin (JARDIANCE) 10 MG TABS tablet Take 1 tablet (10 mg total) by mouth daily. Patient not taking: Reported on 12/19/2020 09/23/20   Raiford Noble Latif, DO  insulin aspart (NOVOLOG) 100 UNIT/ML injection Inject 2-12 Units into the skin See admin instructions. Check and record blood sugar before meals and inject per ssi. 151-200 = 2 units   201-250 = 4 units   251-300 = 6 units   301-350 = 8 units   351-400 = 10 units   greater than 400 = 12 units and call MD. Patient not taking: Reported on 12/19/2020 09/07/20   Antonieta Pert, MD  insulin glargine (LANTUS) 100 UNIT/ML injection Inject 0.15 mLs (15 Units total) into the skin at bedtime. Patient not taking: Reported on 12/19/2020 09/22/20   Raiford Noble Latif, DO  polyethylene glycol (MIRALAX / GLYCOLAX) 17 g packet Take 17 g by mouth daily as needed. Patient not taking: Reported on 03/01/2021 03/30/20   [name], NP    Physical Exam: Vitals:   03/01/21 1215 03/01/21 1220 03/01/21 1225 03/01/21 1340  BP: 133/78 117/69 108/62 (!) 78/53  Pulse: 86 85 86 82  Resp: (!) 9 (!) 9 12 11   Temp:    97.9 F (36.6 C)  TempSrc:       SpO2: 100% 100% 100% 99%  Weight:      Height:       General:  Appears chronically ill and older than stated age Eyes:  does not make eye contact, chronic visual impairment ENT:  grossly normal hearing, lips & tongue, mmm; mostly absent dentition Neck:  no LAD, masses or thyromegaly Cardiovascular:  RRR, no m/r/g. No LE edema.  Respiratory:   CTA bilaterally with no wheezes/rales/rhonchi.  Normal respiratory effort. Abdomen:  soft, NT, ND Skin:  L foot with obvious necrosis of 3/4 toes, poor circulation apparent to both feet with poor hygiene    Musculoskeletal:  decreased tone BUE/BLE Psychiatric:  flat/labile mood and affect, speech sparse but appropriate Neurologic:  unable to effectively perform   Radiological Exams on Admission: Independently reviewed - see discussion in A/P where applicable  CT Foot Left Wo Contrast  Result Date: 03/01/2021 CLINICAL DATA:  Left foot infection. EXAM: CT OF THE LEFT FOOT WITHOUT CONTRAST TECHNIQUE: Multidetector CT imaging of the left foot was performed according to the standard protocol. Multiplanar CT image reconstructions were also generated. COMPARISON:  MRI left foot from same day. Left foot x-rays from yesterday. FINDINGS: Bones/Joint/Cartilage Small foci of gas within the third distal phalanx and fourth middle and distal phalanges. No fracture or dislocation. Mild degenerative changes. No joint effusion. Osteopenia. Ligaments Ligaments are suboptimally evaluated by CT. Muscles and Tendons Grossly intact. Soft tissue Subcutaneous emphysema about the third and fourth toes. Mild diffuse soft tissue swelling. No fluid collection or hematoma. No soft tissue mass. IMPRESSION: 1. 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 2. Third and fourth toe gangrene.  No abscess. Electronically Signed   By: Titus Dubin M.D.   On: 03/01/2021 08:12   MR FOOT LEFT WO  CONTRAST  Result Date: 03/01/2021 CLINICAL DATA:  Foot osteomyelitis suspected.  Diabetes type 2, CKD. EXAM: MRI OF THE LEFT FOOT WITHOUT CONTRAST TECHNIQUE: Multiplanar, multisequence MR imaging of the left was performed. No intravenous contrast was administered. COMPARISON:  Radiographs dated February 28, 2021 FINDINGS: Bones/Joint/Cartilage There is heterogeneity of the marrow of the proximal phalanx of the first  digit as well as proximal and middle phalanges of the second and third digits. No corresponding signal abnormality on T1 or evidence of deep skin wound. Ligaments Grossly intact. Muscles and Tendons There is markedly increased intrasubstance signal of the plantar muscles without evidence of drainable fluid collection or abscess on this noncontrast enhanced examination. 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 Soft tissues Mild subcutaneous soft tissue edema about the third and fourth proximal phalanges. IMPRESSION: 1. No definite evidence of osteomyelitis. Heterogeneous marrow signal about the proximal phalanx of the first digit as well as proximal and middle phalanges of the second and third digits. Which in the absence of adjacent deep skin wound is a nonspecific finding and could be secondary to altered mechanics. 2. 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 No drainable fluid collection or abscess on this noncontrast enhanced examination. Electronically Signed   By: Keane Police D.O.   On: 03/01/2021 08:30   DG Foot Complete Left  Result Date: 02/28/2021 CLINICAL DATA:  Right left foot wound check, rule out gas formation. EXAM: LEFT FOOT - COMPLETE 3+ VIEW COMPARISON:  None. FINDINGS: Evaluation of the third and fourth digits is limited due to overlying bandage material. No acute fracture or dislocation. Gas is identified the soft tissues adjacent about  the third digit and head of the third metatarsal. There are questionable erosions along the medial aspect of the head of the fifth metatarsal. Mild calcaneal spurring is present. Vascular calcifications are noted in the soft tissues. Degenerative changes are noted in the midfoot and ankle. IMPRESSION: 1. Gas in the soft tissues about the third digit and head of the third metatarsal, compatible with history of infection. The possibility of necrotizing fasciitis can not be excluded. 2. Lucency at the head of the fifth metatarsal. The possibility of underlying osteomyelitis can not be excluded. MRI or three-phase bone scan is recommended for further evaluation. Electronically Signed   By: Brett Fairy M.D.   On: 02/28/2021 23:51   VAS [place] ABI WITH/WO TBI  Result Date: 03/01/2021  LOWER EXTREMITY DOPPLER STUDY Patient Name:  VIRGAL WARMUTH  Date of Exam:   03/01/2021 Medical Rec #: [id number]      Accession #:    [id number] Date of Birth: 1960/10/24       Patient Gender: M Patient Age:   30 years Exam Location:  [facility] Procedure:      VAS [place] ABI WITH/WO TBI Referring Phys: Celesta Gentile --------------------------------------------------------------------------------  Indications: Ulceration, and possible necrotizing fasciitis. High Risk Factors: Hypertension, Diabetes. Other Factors: Legally blind, CHF.  Comparison Study: No prior study on file Performing Technologist: Sharion Dove RVS  Examination Guidelines: A complete evaluation includes at minimum, Doppler waveform signals and systolic blood pressure reading at the level of bilateral brachial, anterior tibial, and posterior tibial arteries, when vessel segments are accessible. Bilateral testing is considered an integral part of a complete examination. Photoelectric Plethysmograph (PPG) waveforms and toe systolic pressure readings are included as required and additional duplex testing as needed. Limited examinations for reoccurring indications may be  performed as noted.  ABI Findings: +---------+------------------+-----+----------+--------+  Right     Rt Pressure (mmHg) Index Waveform   Comment   +---------+------------------+-----+----------+--------+  Brachial  151                      triphasic            +---------+------------------+-----+----------+--------+  PTA       254                1.68  monophasic           +---------+------------------+-----+----------+--------+  DP        179                1.19  monophasic           +---------+------------------+-----+----------+--------+  Great Toe 46                 0.30                       +---------+------------------+-----+----------+--------+ +---------+------------------+-----+----------+-------+  Left      Lt Pressure (mmHg) Index Waveform   Comment  +---------+------------------+-----+----------+-------+  Brachial  134                      triphasic           +---------+------------------+-----+----------+-------+  PTA       117                0.77  monophasic          +---------+------------------+-----+----------+-------+  DP        117                0.77  monophasic          +---------+------------------+-----+----------+-------+  Great Toe 19                 0.13                      +---------+------------------+-----+----------+-------+ +-------+-----------+-----------+------------+------------+  ABI/TBI Today's ABI Today's TBI Previous ABI Previous TBI  +-------+-----------+-----------+------------+------------+  Right   1.68        0.30                                   +-------+-----------+-----------+------------+------------+  Left    0.77        0.13                                   +-------+-----------+-----------+------------+------------+   Summary: Right: Resting right ankle-brachial index indicates noncompressible right lower extremity arteries. The right toe-brachial index is abnormal. Left: Resting left ankle-brachial index indicates moderate left lower extremity arterial disease.  The left toe-brachial index is abnormal.  *See table(s) above for measurements and observations.     Preliminary     EKG: Independently reviewed.  NSR with rate 86; nonspecific ST changes with no evidence of acute ischemia   Labs on Admission: I have personally reviewed the available labs and imaging studies at the time of the admission.  Pertinent labs:    Na++ 129 CO2 16 BUN 87/Creatinine 2.33/GFR 31; 32/1.25/60 appears to be baseline Albumin 2.1 CRP 18.7 Lactate 0.6 WBC 12.0 Hgb 8.0   Assessment/Plan * Diabetic foot infection (HCC)- (present on admission) -Patient was sent from nursing facility due to concern for critical limb ischemia with gangrene -Imaging was performed and showed osteo with concern for nec fasc -He was taken to the OR this AM for L TMA  -Will have angiogram tomorrow -He may need to return to the OR again, and he remains at high risk for further amputation -Negative lactate, no  current concerns for sepsis -Will treat with IV antibiotics (Rocephin, Flagyl, Vanc as per the lower extremity wound algorithm) -LE wound order set utilized including labs (CRP, ESR, A1c, prealbumin, HIV, and blood cultures) and consults (TOC team; diabetes coordinator; peripheral vascular navigator; and nutrition)  PAD (peripheral artery disease) ([place])- (present on admission) -ABIs were performed and showed noncompressible RLE arteries and moderate LLE arterial disease -For aortogram tomorrow with Dr. Stanford Breed -Resume ASA when ok with vascular and podiatry  Glaucoma (increased eye pressure)- (present on admission) -Continue dorzolamide, brimonidine  Acute renal failure superimposed on stage 3a chronic kidney disease ([place])- (present on admission) -Baseline stage 3a CKD with GFR about 60 -Currently with GFR 31 -Possible related to increased metabolic demands in the setting of infection -Will gently hydrate and follow -Based on long-standing uncontrolled HTN and DM as well as  apparent vasculopathy, he is at high risk for progressive CKD and eventually needing HD  Chronic diastolic CHF (congestive heart failure) ([place])- (present on admission) -Appears to be compensated at this time -Would recommend ongoing monitoring for evidence of volume overload -Holding Demadex, Entresto for now -Continue Bidil  Uncontrolled type 2 diabetes mellitus with hyperglycemia ([place])- (present on admission) -Prior A1c was 9.1, indicating poor control -hold Jardiance -Continue glargine -Cover with sensitive-scale SSI  Depression, major, recurrent, severe with psychosis ([place])- (present on admission) -Patient with reported h/o bipolar as well as depression -Continue Zoloft  Essential hypertension- (present on admission) -Continue Noravsc, Bidil -Hold Entresto due to AKI -Will cover with prn IV hydralazine    Advance Care Planning:  Full code  Consults: Vascular surgery; podiatry; TOC team; diabetes coordinator; peripheral vascular navigator; and nutrition  Family Communication: Dr. Jacqualyn Posey spoke with his sister at the time of admission  Severity of Illness: The appropriate patient status for this patient is INPATIENT. Inpatient status is judged to be reasonable and necessary in order to provide the required intensity of service to ensure the patient's safety. The patient's presenting symptoms, physical exam findings, and initial radiographic and laboratory data in the context of their chronic comorbidities is felt to place them at high risk for further clinical deterioration. Furthermore, it is not anticipated that the patient will be medically stable for discharge from the hospital within 2 midnights of admission.   * I certify that at the point of admission it is my clinical judgment that the patient will require inpatient hospital care spanning beyond 2 midnights from the point of admission due to high intensity of service, high risk for further deterioration and high frequency  of surveillance required.*  Author: Karmen Bongo, MD 03/01/2021 2:12 PM  For on call review [email or web address].

## 2021-03-01 NOTE — Assessment & Plan Note (Signed)
-  Continue dorzolamide, brimonidine

## 2021-03-01 NOTE — Assessment & Plan Note (Signed)
-  Patient was sent from nursing facility due to concern for critical limb ischemia with gangrene -Imaging was performed and showed osteo with concern for nec fasc -He was taken to the OR this AM for L TMA  -Will have angiogram tomorrow -He may need to return to the OR again, and he remains at high risk for further amputation -Negative lactate, no current concerns for sepsis -Will treat with IV antibiotics (Rocephin, Flagyl, Vanc as per the lower extremity wound algorithm) -LE wound order set utilized including labs (CRP, ESR, A1c, prealbumin, HIV, and blood cultures) and consults (TOC team; diabetes coordinator; peripheral vascular navigator; and nutrition)

## 2021-03-01 NOTE — Anesthesia Procedure Notes (Signed)
Anesthesia Regional Block: Adductor canal block   Pre-Anesthetic Checklist: , timeout performed,  Correct Patient, Correct Site, Correct Laterality,  Correct Procedure, Correct Position, site marked,  Risks and benefits discussed,  Surgical consent,  Pre-op evaluation,  At surgeon's request and post-op pain management  Laterality: Left  Prep: chloraprep       Needles:  Injection technique: Single-shot  Needle Type: Echogenic Needle     Needle Length: 9cm  Needle Gauge: 21     Additional Needles:   Procedures:,,,, ultrasound used (permanent image in chart),,    Narrative:  Start time: 03/01/2021 12:13 PM End time: 03/01/2021 12:17 PM Injection made incrementally with aspirations every 5 mL.  Performed by: Personally  Anesthesiologist: Suzette Battiest, MD

## 2021-03-01 NOTE — ED Notes (Signed)
Patient transported to CT 

## 2021-03-01 NOTE — Progress Notes (Signed)
I called and spoke to the patients sister at his request to let her know about his admission overnight and the plan. I spoke to [place] [phone]). I went over the plan for TMA today and vascular intervention likely tomorrow. She is going to talk to his dad. I let her know surgery is planned for 12pm today for TMA. She had no further questions.

## 2021-03-01 NOTE — Assessment & Plan Note (Signed)
-  Continue Noravsc, Bidil -Hold Entresto due to AKI -Will cover with prn IV hydralazine

## 2021-03-01 NOTE — Assessment & Plan Note (Signed)
-  Patient with reported h/o bipolar as well as depression -Continue Zoloft

## 2021-03-01 NOTE — Consult Note (Addendum)
Reason for Consult:Infection Referring Physician: Teressa Lower, MD  [name] is an 61 y.o. male.  HPI: 61 year old male with past medical history significant for congestive heart failure, uncontrolled diabetes, bipolar was admitted to the hospital overnight for infection in his left foot.  X-rays were performed which revealed soft tissue emphysema concerning for possible necrotizing fasciitis.  Podiatry was consulted.  Not sure how long he has been dealing with the left foot.  Past Medical History:  Diagnosis Date   Arthritis    Bipolar affective ([place])    CHF (congestive heart failure) ([place])    Claustrophobia    Severe   Depression    Diabetes mellitus    uncontrolled    Heart murmur    Hypertension     Past Surgical History:  Procedure Laterality Date   BOWEL DECOMPRESSION N/A 01/19/2020   Procedure: BOWEL DECOMPRESSION;  Surgeon: Otis Brace, MD;  Location: [facility];  Service: Gastroenterology;  Laterality: N/A;   BOWEL DECOMPRESSION N/A 11/19/2020   Procedure: BOWEL DECOMPRESSION;  Surgeon: Wilford Corner, MD;  Location: [facility];  Service: Endoscopy;  Laterality: N/A;   BOWEL DECOMPRESSION N/A 12/03/2020   Procedure: BOWEL DECOMPRESSION;  Surgeon: Ronnette Juniper, MD;  Location: [facility];  Service: Gastroenterology;  Laterality: N/A;   COLONOSCOPY WITH PROPOFOL N/A 01/19/2020   Procedure: COLONOSCOPY WITH PROPOFOL;  Surgeon: Otis Brace, MD;  Location: [facility];  Service: Gastroenterology;  Laterality: N/A;   COLONOSCOPY WITH PROPOFOL N/A 11/19/2020   Procedure: COLONOSCOPY WITH PROPOFOL;  Surgeon: Wilford Corner, MD;  Location: [facility];  Service: Endoscopy;  Laterality: N/A;   COLONOSCOPY WITH PROPOFOL N/A 12/03/2020   Procedure: COLONOSCOPY WITH PROPOFOL;  Surgeon: Ronnette Juniper, MD;  Location[facility];  Service: Gastroenterology;  Laterality: N/A;   KNEE SURGERY     b/l knees; pending total knee    RADIOLOGY WITH ANESTHESIA N/A  07/04/2020   Procedure: MRI WITH ANESTHESIA BRAIN WITHOUT CONTRAST AND HEAD WITHOUT CONTRAST;  Surgeon: Radiologist, Medication, MD;  Location: [facility];  Service: Radiology;  Laterality: N/A;    Family History  Problem Relation Age of Onset   Diabetes Other    Cancer Mother        uterus; deceased    Heart attack Mother 51   Hypertension Other    Heart attack Father 32   Stroke Sister        12/2012 age 72 y.o    Social History:  reports that he has quit smoking. He has never used smokeless tobacco. He reports that he does not currently use drugs after having used the following drugs: Cocaine. He reports that he does not drink alcohol.  Allergies:  Allergies  Allergen Reactions   Other Anaphylaxis, Itching, Swelling and Other (See Comments)    Allergy to GRITS- exact ingredient is not known    Medications: I have reviewed the patient's current medications.  Results for orders placed or performed during the hospital encounter of 02/28/21 (from the past 48 hour(s))  CBC with Differential     Status: Abnormal   Collection Time: 02/28/21 11:50 PM  Result Value Ref Range   WBC 12.0 (H) 4.0 - 10.5 K/uL   RBC 3.45 (L) 4.22 - 5.81 MIL/uL   Hemoglobin 8.0 (L) 13.0 - 17.0 g/dL    Comment: Reticulocyte Hemoglobin testing may be clinically indicated, consider ordering this additional test [id number]    HCT 26.0 (L) 39.0 - 52.0 %   MCV 75.4 (L) 80.0 - 100.0 fL  MCH 23.2 (L) 26.0 - 34.0 pg   MCHC 30.8 30.0 - 36.0 g/dL   RDW 14.6 11.5 - 15.5 %   Platelets 391 150 - 400 K/uL   nRBC 0.0 0.0 - 0.2 %   Neutrophils Relative % 74 %   Neutro Abs 8.9 (H) 1.7 - 7.7 K/uL   Lymphocytes Relative 15 %   Lymphs Abs 1.8 0.7 - 4.0 K/uL   Monocytes Relative 9 %   Monocytes Absolute 1.0 0.1 - 1.0 K/uL   Eosinophils Relative 1 %   Eosinophils Absolute 0.1 0.0 - 0.5 K/uL   Basophils Relative 0 %   Basophils Absolute 0.0 0.0 - 0.1 K/uL   Immature Granulocytes 1 %   Abs Immature Granulocytes 0.13 (H)  0.00 - 0.07 K/uL    Comment: Performed at [facility] [address], [place], [place] [address]  Comprehensive metabolic panel     Status: Abnormal   Collection Time: 02/28/21 11:50 PM  Result Value Ref Range   Sodium 129 (L) 135 - 145 mmol/L   Potassium 4.1 3.5 - 5.1 mmol/L   Chloride 101 98 - 111 mmol/L   CO2 16 (L) 22 - 32 mmol/L   Glucose, Bld 210 (H) 70 - 99 mg/dL    Comment: Glucose reference range applies only to samples taken after fasting for at least 8 hours.   BUN 87 (H) 6 - 20 mg/dL   Creatinine, Ser 2.33 (H) 0.61 - 1.24 mg/dL   Calcium 8.3 (L) 8.9 - 10.3 mg/dL   Total Protein 7.2 6.5 - 8.1 g/dL   Albumin 2.1 (L) 3.5 - 5.0 g/dL   AST 11 (L) 15 - 41 U/L   ALT 14 0 - 44 U/L   Alkaline Phosphatase 107 38 - 126 U/L   Total Bilirubin 0.9 0.3 - 1.2 mg/dL   GFR, Estimated 31 (L) >60 mL/min    Comment: (NOTE) Calculated using the CKD-EPI Creatinine Equation (2021)    Anion gap 12 5 - 15    Comment: Performed at [facility], [place] [address], [place], [place] [address]  Sedimentation rate     Status: Abnormal   Collection Time: 02/28/21 11:50 PM  Result Value Ref Range   Sed Rate 124 (H) 0 - 16 mm/hr    Comment: Performed at [facility] [address], [place], [place] [address]  C-reactive protein     Status: Abnormal   Collection Time: 02/28/21 11:50 PM  Result Value Ref Range   CRP 18.7 (H) <1.0 mg/dL    Comment: Performed at [facility] [address] [place], [place] [address]  Lactic acid, plasma     Status: None   Collection Time: 03/01/21 12:21 AM  Result Value Ref Range   Lactic Acid, Venous 0.6 0.5 - 1.9 mmol/L    Comment: Performed at [facility] [address] [place],  [address]    DG Foot Complete Left  Result Date: 02/28/2021 CLINICAL DATA:  Right left foot wound check, rule out gas formation. EXAM: LEFT FOOT - COMPLETE 3+ VIEW COMPARISON:  None. FINDINGS: Evaluation of the third and fourth digits is limited  due to overlying bandage material. No acute fracture or dislocation. Gas is identified the soft tissues adjacent about the third digit and head of the third metatarsal. There are questionable erosions along the medial aspect of the head of the fifth metatarsal. Mild calcaneal spurring is present. Vascular calcifications are noted  in the soft tissues. Degenerative changes are noted in the midfoot and ankle. IMPRESSION: 1. Gas in the soft tissues about the third digit and head of the third metatarsal, compatible with history of infection. The possibility of necrotizing fasciitis can not be excluded. 2. Lucency at the head of the fifth metatarsal. The possibility of underlying osteomyelitis can not be excluded. MRI or three-phase bone scan is recommended for further evaluation. Electronically Signed   By: Brett Fairy M.D.   On: 02/28/2021 23:51    Review of Systems Blood pressure 126/84, pulse 82, temperature 98.9 F (37.2 C), temperature source Oral, resp. rate 16, height 5\' 8"  (1.727 m), weight 91.2 kg, SpO2 98 %. Physical Exam General: AAO x3, NAD  Dermatological: Full-thickness ulcerations noted along the third and fourth digits.  Gangrenous changes present to the third and fourth digit as well.  There is no fluctuation.       Vascular: Unable to palpate pulses.  Neruologic: Sensation decreased.  Musculoskeletal: Mild discomfort on exam.   Assessment/Plan: Left foot infection, gangrene, PAD  White blood cell count elevated but lactic acid is normal.  Sed rate, CRP elevated.  Currently afebrile.  I discussed with the patient the severity of this infection and concern for peripheral arterial disease.  I have ordered ABIs and recommend vascular surgery consult.  I discussed with the patient likely plan for transmetatarsal amputation later today.  I discussed the surgery was postoperative course with the patient.  Discussed risks of surgery including loss of leg, delayed or nonhealing,  bleeding as well as general risks of surgery including stroke, heart attack, death.  Discussed that with concern of circulation issues and gangrene that he is at high risk of limb loss.  NPO.  *I spoke with Dr. Stanford Breed with Vascular Surgery- will plan for TMA today for source control. Likely angio Friday.   Trula Slade 03/01/2021, 7:02 AM

## 2021-03-01 NOTE — Progress Notes (Signed)
Patient seen in pre-op. We again discussed the surgery and the plan of care. We discussed LEFT TMA and what that means. We discussed when he wakes up he will not have any toes and part of the bone behind it will be gone. He asked me how I do it. He had no further questions or concerns and he signed the consent to the best of my abilities and was verified with the nursing staff. I have called his sister early today and will update her after as well.

## 2021-03-01 NOTE — Anesthesia Postprocedure Evaluation (Signed)
Anesthesia Post Note  Patient: [name]  Procedure(s) Performed: TRANSMETATARSAL AMPUTATION (Left: Toe)     Patient location during evaluation: PACU Anesthesia Type: Regional Level of consciousness: awake and alert Pain management: pain level controlled Vital Signs Assessment: post-procedure vital signs reviewed and stable Respiratory status: spontaneous breathing, nonlabored ventilation, respiratory function stable and patient connected to nasal cannula oxygen Cardiovascular status: stable and blood pressure returned to baseline Postop Assessment: no apparent nausea or vomiting Anesthetic complications: no   No notable events documented.  Last Vitals:  Vitals:   03/01/21 1410 03/01/21 1425  BP: (!) 112/59 111/60  Pulse: 82 81  Resp: 15 15  Temp:    SpO2: 100% 100%    Last Pain:  Vitals:   03/01/21 1410  TempSrc:   PainSc: Tyler Deis

## 2021-03-01 NOTE — Plan of Care (Signed)
Pt arrived on the unit and was received by RN, pt has had no complaints or discomfort at this time and wishes to be left alone to rest. Pt's skin was assessed by Juliann Pulse, RN and self. No skin break down was noted. Legs appeared dry and shiny, but no other anomalies noted at this time.    Problem: Education: Goal: Knowledge of General Education information will improve Description: Including pain rating scale, medication(s)/side effects and non-pharmacologic comfort measures Outcome: Progressing   Problem: Health Behavior/Discharge Planning: Goal: Ability to manage health-related needs will improve Outcome: Progressing   Problem: Clinical Measurements: Goal: Ability to maintain clinical measurements within normal limits will improve Outcome: Progressing Goal: Will remain free from infection Outcome: Progressing Goal: Diagnostic test results will improve Outcome: Progressing Goal: Respiratory complications will improve Outcome: Progressing Goal: Cardiovascular complication will be avoided Outcome: Progressing   Problem: Activity: Goal: Risk for activity intolerance will decrease Outcome: Progressing   Problem: Nutrition: Goal: Adequate nutrition will be maintained Outcome: Progressing   Problem: Coping: Goal: Level of anxiety will decrease Outcome: Progressing   Problem: Elimination: Goal: Will not experience complications related to bowel motility Outcome: Progressing Goal: Will not experience complications related to urinary retention Outcome: Progressing   Problem: Pain Managment: Goal: General experience of comfort will improve Outcome: Progressing   Problem: Safety: Goal: Ability to remain free from injury will improve Outcome: Progressing   Problem: Skin Integrity: Goal: Risk for impaired skin integrity will decrease Outcome: Progressing

## 2021-03-01 NOTE — Brief Op Note (Signed)
02/28/2021 - 03/01/2021  1:28 PM  PATIENT:  [name]  61 y.o. male  PRE-OPERATIVE DIAGNOSIS:  Infection  POST-OPERATIVE DIAGNOSIS:  Infection  PROCEDURE:  Procedure(s): TRANSMETATARSAL AMPUTATION (Left)  SURGEON:  Surgeon(s) and Role:    Trula Slade, DPM - Primary  PHYSICIAN ASSISTANT:   ASSISTANTS: none   ANESTHESIA:   none  EBL:  100 mL   BLOOD ADMINISTERED:none  DRAINS: JP drain left foot    LOCAL MEDICATIONS USED:  NONE  SPECIMEN:  wound culture; toes for pathology  DISPOSITION OF SPECIMEN:  PATHOLOGY  COUNTS:  YES  TOURNIQUET:  * Missing tourniquet times found for documented tourniquets in log: [id number] *  DICTATION: .Dragon Dictation  PLAN OF CARE: Admit to inpatient   PATIENT DISPOSITION:  PACU - hemodynamically stable.   Delay start of Pharmacological VTE agent (>24hrs) due to surgical blood loss or risk of bleeding: no  Intraoperative findings: S/p Left TMA. Necrosis and purulence from the toes. After TMA, debrided nonviable tissue from the flap. The tissue appeared healthy and no purulence so I did close the incision with a JP drain in place. He may still need to return to the OR for debridement, washout given the infection and will monitor lab work/vitals and also to see about possilbe vascular intervention tomorrow.

## 2021-03-01 NOTE — ED Notes (Signed)
Pt transported to [facility]. Report called to OR RN.

## 2021-03-01 NOTE — Consult Note (Addendum)
Hospital Consult    Reason for Consult:  CLI with left foot gangrene Requesting Physician:  . Dr. Jacqualyn Posey MRN #:  [id number]  History of Present Illness: This is a 61 y.o. male with PMH significant for CHF, DM, bipolar disorder, HTN, CKD 3 (creatinine 2.3 yesterday), hx CVA with residual right sided weakness, polysubstance abuse and blindness and resides in a SNF.  He presented to the hospital with wound on the toes of the left foot and was foul smelling.  Xrays were concerning for possible necrotizing fascitits and podiatry was consulted.  Plan is for left TMA today for source control.  Vascular surgery is consulted for evaluation of arterial flow.  ABI's obtained today revealed a toe pressure of 19 on the left, which is not adequate for wound healing.    He states he does not walk.  He states that he does have right sided weakness but can do what you ask him with the right side.  Pt is blind.  Statin:  Yes.   Beta Blocker:  No. Aspirin:  Yes.   ACEI:  No. ARB:  Yes.   CCB use:  Yes Other antiplatelets/anticoagulants:  No.   Past Medical History:  Diagnosis Date   Arthritis    Bipolar affective ([place])    CHF (congestive heart failure) (Uncertain)    Claustrophobia    Severe   Depression    Diabetes mellitus    uncontrolled    Heart murmur    Hypertension     Past Surgical History:  Procedure Laterality Date   BOWEL DECOMPRESSION N/A 01/19/2020   Procedure: BOWEL DECOMPRESSION;  Surgeon: Otis Brace, MD;  Location: [facility];  Service: Gastroenterology;  Laterality: N/A;   BOWEL DECOMPRESSION N/A 11/19/2020   Procedure: BOWEL DECOMPRESSION;  Surgeon: Wilford Corner, MD;  Location: [facility];  Service: Endoscopy;  Laterality: N/A;   BOWEL DECOMPRESSION N/A 12/03/2020   Procedure: BOWEL DECOMPRESSION;  Surgeon: Ronnette Juniper, MD;  Location: [facility];  Service: Gastroenterology;  Laterality: N/A;   COLONOSCOPY WITH PROPOFOL N/A 01/19/2020   Procedure: COLONOSCOPY WITH  PROPOFOL;  Surgeon: Otis Brace, MD;  Location: [facility];  Service: Gastroenterology;  Laterality: N/A;   COLONOSCOPY WITH PROPOFOL N/A 11/19/2020   Procedure: COLONOSCOPY WITH PROPOFOL;  Surgeon: Wilford Corner, MD;  Location: [facility];  Service: Endoscopy;  Laterality: N/A;   COLONOSCOPY WITH PROPOFOL N/A 12/03/2020   Procedure: COLONOSCOPY WITH PROPOFOL;  Surgeon: Ronnette Juniper, MD;  Location: [facility];  Service: Gastroenterology;  Laterality: N/A;   KNEE SURGERY     b/l knees; pending total knee    RADIOLOGY WITH ANESTHESIA N/A 07/04/2020   Procedure: MRI WITH ANESTHESIA BRAIN WITHOUT CONTRAST AND HEAD WITHOUT CONTRAST;  Surgeon: Radiologist, Medication, MD;  Location: [facility];  Service: Radiology;  Laterality: N/A;    Allergies  Allergen Reactions   Other Anaphylaxis, Itching, Swelling and Other (See Comments)    Allergy to GRITS- exact ingredient is not known    Prior to Admission medications   Medication Sig Start Date End Date Taking? Authorizing Provider  acetaminophen (TYLENOL) 650 MG CR tablet Take 650 mg by mouth every 4 (four) hours as needed (for general discomfort).   Yes [provider]  amLODipine (NORVASC) 10 MG tablet Take 10 mg by mouth daily.   Yes [provider]  aspirin 81 MG chewable tablet Chew 81 mg by mouth daily.   Yes [provider]  atorvastatin (LIPITOR) 80 MG tablet Take 80 mg by mouth at bedtime.  Yes [provider]  brimonidine (ALPHAGAN) 0.2 % ophthalmic solution Place 1 drop into both eyes in the morning and at bedtime. 12/20/20  Yes Plunkett, Loree Fee, MD  dorzolamide (TRUSOPT) 2 % ophthalmic solution Place 1 drop into both eyes 2 (two) times daily. 12/20/20  Yes Plunkett, Loree Fee, MD  empagliflozin (JARDIANCE) 25 MG TABS tablet Take 25 mg by mouth in the morning.   Yes [provider]  ferrous sulfate 325 (65 FE) MG tablet Take 1 tablet (325 mg total) by mouth daily. 09/23/20  Yes [name],  [name], DO  insulin glargine-yfgn (SEMGLEE) 100 UNIT/ML Pen Inject 14 Units into the skin 2 (two) times daily.   Yes [provider]  insulin lispro (HUMALOG) 100 UNIT/ML KwikPen Inject 0-10 Units into the skin See admin instructions. Inject 0-10 units into the skin three times a day before meals, per sliding scale: BGL 0-150 = 0 units; 151-200 = 2 units; 201-250 = 4 units; 251-300 = 6 units; 301-350 = 8 units; 351-400 = 10 units   Yes [provider]  isosorbide-hydrALAZINE (BIDIL) 20-37.5 MG tablet Take 1 tablet by mouth 3 (three) times daily. 09/07/20  Yes Antonieta Pert, MD  loperamide (IMODIUM) 2 MG capsule Take 1 capsule (2 mg total) by mouth every 6 (six) hours as needed for diarrhea or loose stools. 12/11/20  Yes Dwyane Dee, MD  Multiple Vitamin (MULTIVITAMIN) tablet Take 1 tablet by mouth daily.   Yes [provider]  ondansetron (ZOFRAN) 4 MG tablet Take 4 mg by mouth every 4 (four) hours as needed for nausea or vomiting.   Yes [provider]  pantoprazole (PROTONIX) 40 MG tablet Take 40 mg by mouth daily.   Yes [provider]  potassium chloride (KLOR-CON) 10 MEQ tablet Take 2 tablets (20 mEq total) by mouth daily. 12/11/20  Yes Dwyane Dee, MD  pregabalin (LYRICA) 75 MG capsule Take 75 mg by mouth 2 (two) times daily.   Yes [provider]  saccharomyces boulardii (FLORASTOR) 250 MG capsule Take 1 capsule (250 mg total) by mouth 2 (two) times daily. 09/07/20  Yes Kc, Maren Beach, MD  sacubitril-valsartan (ENTRESTO) 49-51 MG Take 1 tablet by mouth 2 (two) times daily.   Yes [provider]  senna-docusate (SENOKOT-S) 8.6-50 MG tablet Take 1 tablet by mouth daily.   Yes [provider]  sertraline (ZOLOFT) 50 MG tablet Take 50 mg by mouth at bedtime.   Yes [provider]  Skin Protectants, Misc. (CARR[place] MOIST BARRIER/ZINC) CREA Apply 1 application topically See admin instructions. Apply to bilateral shin,  heels,left lower buttock skin tear. Place foam dressing over bilateral shins every day and night shift for wound care   Yes [provider]  tamsulosin (FLOMAX) 0.4 MG CAPS capsule Take 0.8 mg by mouth daily.   Yes [provider]  torsemide (DEMADEX) 20 MG tablet Take 40 mg by mouth 2 (two) times daily.   Yes [provider]  Vitamin D, Ergocalciferol, (DRISDOL) 1.25 MG (50000 UNIT) CAPS capsule Take 50,000 Units by mouth every 30 (thirty) days. 17th of each month   Yes [provider]  empagliflozin (JARDIANCE) 10 MG TABS tablet Take 1 tablet (10 mg total) by mouth daily. Patient not taking: Reported on 12/19/2020 09/23/20   Raiford Noble Latif, DO  insulin aspart (NOVOLOG) 100 UNIT/ML injection Inject 2-12 Units into the skin See admin instructions. Check and record blood sugar before meals and inject per ssi. 151-200 = 2 units   201-250 =  4 units   251-300 = 6 units   301-350 = 8 units   351-400 = 10 units   greater than 400 = 12 units and call MD. Patient not taking: Reported on 12/19/2020 09/07/20   Antonieta Pert, MD  insulin glargine (LANTUS) 100 UNIT/ML injection Inject 0.15 mLs (15 Units total) into the skin at bedtime. Patient not taking: Reported on 12/19/2020 09/22/20   Raiford Noble Latif, DO  polyethylene glycol (MIRALAX / GLYCOLAX) 17 g packet Take 17 g by mouth daily as needed. Patient not taking: Reported on 03/01/2021 03/30/20   [name], NP    Social History   Socioeconomic History   Marital status: Single    Spouse name: Not on file   Number of children: Not on file   Years of education: Not on file   Highest education level: Not on file  Occupational History   Not on file  Tobacco Use   Smoking status: Former   Smokeless tobacco: Never  Vaping Use   Vaping Use: Never used  Substance and Sexual Activity   Alcohol use: No   Drug use: Not Currently    Types: Cocaine    Comment: Last use 2019   Sexual activity: Yes    Birth  control/protection: Condom  Other Topics Concern   Not on file  Social History Narrative   Moved from [place]   Denies smoking, drinking   +cocaine history (denies recent use)   Originally from [place]    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[employer]   No kids, married                      Social Determinants of Radio broadcast assistant Strain: Not on file  Food Insecurity: No Food Insecurity   Worried About Charity fundraiser in the Last Year: Never true   Arboriculturist in the Last Year: Never true  Transportation Needs: No Transportation Needs   Lack of Transportation (Medical): No   Lack of Transportation (Non-Medical): No  Physical Activity: Not on file  Stress: Not on file  Social Connections: Not on file  Intimate Partner Violence: Not on file     Family History  Problem Relation Age of Onset   Diabetes Other    Cancer Mother        uterus; deceased    Heart attack Mother 89   Hypertension Other    Heart attack Father 73   Stroke Sister        12/2012 age 60 y.o    ROS: Otherwise negative unless mentioned in HPI  Physical Examination  Vitals:   03/01/21 0524 03/01/21 0726  BP: 126/84 136/81  Pulse: 82 84  Resp: 16 11  Temp:    SpO2: 98% 98%   Body mass index is 30.56 kg/m.  General:  WDWN in NAD Gait: Not observed HENT: WNL, normocephalic Pulmonary: normal non-labored breathing Cardiac: regular, Abdomen:  soft, NT/ND Skin: without rashes Vascular Exam/Pulses:  Palpable bilateral radial and femoral pulses; pedal pulses are not palpable Extremities:     Musculoskeletal: no muscle wasting or atrophy  Neurologic: A&O X 3;  No focal weakness or paresthesias are detected; speech is fluent/normal Psychiatric:  The pt has Normal affect. Lymph:  Unremarkable  CBC    Component Value Date/Time   WBC 12.0 (H) 02/28/2021 2350   RBC 3.45 (L) 02/28/2021 2350   HGB 8.0 (  L) 02/28/2021 2350   HGB  13.9 12/31/2013 1336   HCT 26.0 (L) 02/28/2021 2350   HCT 43.0 12/31/2013 1336   PLT 391 02/28/2021 2350   PLT 184 12/31/2013 1336   MCV 75.4 (L) 02/28/2021 2350   MCV 81 12/31/2013 1336   MCH 23.2 (L) 02/28/2021 2350   MCHC 30.8 02/28/2021 2350   RDW 14.6 02/28/2021 2350   RDW 14.4 12/31/2013 1336   LYMPHSABS 1.8 02/28/2021 2350   MONOABS 1.0 02/28/2021 2350   EOSABS 0.1 02/28/2021 2350   BASOSABS 0.0 02/28/2021 2350    BMET    Component Value Date/Time   NA 129 (L) 02/28/2021 2350   NA 135 (L) 12/31/2013 1336   K 4.1 02/28/2021 2350   K 3.8 12/31/2013 1336   CL 101 02/28/2021 2350   CL 101 12/31/2013 1336   CO2 16 (L) 02/28/2021 2350   CO2 25 12/31/2013 1336   GLUCOSE 210 (H) 02/28/2021 2350   GLUCOSE 249 (H) 12/31/2013 1336   BUN 87 (H) 02/28/2021 2350   BUN 8 12/31/2013 1336   CREATININE 2.33 (H) 02/28/2021 2350   CREATININE 0.88 12/31/2013 1336   CALCIUM 8.3 (L) 02/28/2021 2350   CALCIUM 8.6 12/31/2013 1336   GFRNONAA 31 (L) 02/28/2021 2350   GFRNONAA >60 12/31/2013 1336   GFRAA >60 01/29/2018 1705   GFRAA >60 12/31/2013 1336      Non-Invasive Vascular Imaging:   ABI 03/01/2021 +-------+-----------+-----------+------------+------------+   ABI/TBI Today's ABI Today's TBI Previous ABI Previous TBI   +-------+-----------+-----------+------------+------------+   Right   1.68        0.30                                    +-------+-----------+-----------+------------+------------+   Left    0.77        0.13                                    +-------+-----------+-----------+------------+------------+  Right great toe pressure 46 Left great toe pressure 19  CT LLE 03/01/2021 IMPRESSION: 1. 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 2. Third and fourth toe gangrene.  No abscess.  MRI 03/01/2021: IMPRESSION: 1. No definite evidence of osteomyelitis. Heterogeneous  marrow signal about the proximal phalanx of the first digit as well as proximal and middle phalanges of the second and third digits. Which in the absence of adjacent deep skin wound is a nonspecific finding and could be secondary to altered mechanics.   2. 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 No drainable fluid collection or abscess on this noncontrast enhanced examination.    ASSESSMENT/PLAN: This is a 61 y.o. male with gangrene of left foot and possible underlying osteomyelitis.  Radiographs concerning for possible necrotizing fasciitis and osteomyelitis.  Podiatry is taking patient to OR today for TMA for source control of infection Non invasive arterial studies have been ordered and toe pressure on the left is not adequate for wound healing.  The plan will be for Angiogram on Friday with Dr. Stanford Breed for further evaluation of LLE perfusion with possible intervention.  Pt with CKD 3 and BUN/creatinine elevated today at 87/2.33 respectively.  Will need  to use CO2 during angiogram tomorrow.  -continue asa/statin   Leontine Locket PA-C [facility] [phone] 03/01/2021 11:17 AM  VASCULAR STAFF ADDENDUM: I agree with the above.  Patient in OR at moment. Plan angiogram tomorrow with me in cath lab. Will review risk / benefit / alternative with patient tomorrow pre-procedure.  [name]. Stanford Breed, MD [facility] Phone Number: [phone] 03/01/2021 1:21 PM

## 2021-03-01 NOTE — Anesthesia Procedure Notes (Signed)
Procedure Name: MAC Date/Time: 03/01/2021 12:40 PM Performed by: Moshe Salisbury, CRNA Pre-anesthesia Checklist: Patient identified, Emergency Drugs available, Suction available and Patient being monitored Oxygen Delivery Method: Nasal cannula Placement Confirmation: positive ETCO2 Dental Injury: Teeth and Oropharynx as per pre-operative assessment

## 2021-03-01 NOTE — ED Notes (Signed)
Patient transported to MRI 

## 2021-03-01 NOTE — Assessment & Plan Note (Signed)
-  Baseline stage 3a CKD with GFR about 60 -Currently with GFR 31 -Possible related to increased metabolic demands in the setting of infection -Will gently hydrate and follow -Based on long-standing uncontrolled HTN and DM as well as apparent vasculopathy, he is at high risk for progressive CKD and eventually needing HD

## 2021-03-01 NOTE — ED Notes (Signed)
Attempted to have patient sign consent for surgery. Pt verbalizes understanding that he is going to have surgery, but believes his right foot is the one being operated on. He also has questions for the surgery team about how much of his foot is being removed. Consent not obtained at this time. Will notify surgery team of patient's questions and concerns.

## 2021-03-01 NOTE — ED Notes (Signed)
Pt transported to vascular at this time via stretcher.

## 2021-03-01 NOTE — Assessment & Plan Note (Addendum)
-  Prior A1c was 9.1, indicating poor control -hold Jardiance -Continue glargine -Cover with sensitive-scale SSI

## 2021-03-02 ENCOUNTER — Encounter (HOSPITAL_COMMUNITY)
Admission: EM | Disposition: A | Discharge status: Skilled Nursing Facility | Payer: Self-pay | Source: Skilled Nursing Facility | Attending: Internal Medicine

## 2021-03-02 ENCOUNTER — Ambulatory Visit (HOSPITAL_COMMUNITY): Admission: RE | Admit: 2021-03-02 | Payer: Medicare HMO | Source: Home / Self Care | Admitting: Vascular Surgery

## 2021-03-02 ENCOUNTER — Encounter (HOSPITAL_COMMUNITY): Payer: Self-pay | Admitting: Podiatry

## 2021-03-02 DIAGNOSIS — Z7189 Other specified counseling: Secondary | ICD-10-CM

## 2021-03-02 DIAGNOSIS — N179 Acute kidney failure, unspecified: Secondary | ICD-10-CM | POA: Diagnosis not present

## 2021-03-02 DIAGNOSIS — I10 Essential (primary) hypertension: Secondary | ICD-10-CM

## 2021-03-02 DIAGNOSIS — E11628 Type 2 diabetes mellitus with other skin complications: Secondary | ICD-10-CM | POA: Diagnosis not present

## 2021-03-02 DIAGNOSIS — Z8659 Personal history of other mental and behavioral disorders: Secondary | ICD-10-CM

## 2021-03-02 DIAGNOSIS — H543 Unqualified visual loss, both eyes: Secondary | ICD-10-CM

## 2021-03-02 DIAGNOSIS — N1831 Chronic kidney disease, stage 3a: Secondary | ICD-10-CM

## 2021-03-02 DIAGNOSIS — N189 Chronic kidney disease, unspecified: Secondary | ICD-10-CM

## 2021-03-02 DIAGNOSIS — R5381 Other malaise: Secondary | ICD-10-CM

## 2021-03-02 DIAGNOSIS — H409 Unspecified glaucoma: Secondary | ICD-10-CM

## 2021-03-02 DIAGNOSIS — I959 Hypotension, unspecified: Secondary | ICD-10-CM

## 2021-03-02 DIAGNOSIS — D631 Anemia in chronic kidney disease: Secondary | ICD-10-CM

## 2021-03-02 DIAGNOSIS — I5032 Chronic diastolic (congestive) heart failure: Secondary | ICD-10-CM | POA: Diagnosis not present

## 2021-03-02 DIAGNOSIS — E871 Hypo-osmolality and hyponatremia: Secondary | ICD-10-CM

## 2021-03-02 DIAGNOSIS — E1165 Type 2 diabetes mellitus with hyperglycemia: Secondary | ICD-10-CM

## 2021-03-02 DIAGNOSIS — E43 Unspecified severe protein-calorie malnutrition: Secondary | ICD-10-CM

## 2021-03-02 DIAGNOSIS — I739 Peripheral vascular disease, unspecified: Secondary | ICD-10-CM

## 2021-03-02 HISTORY — PX: ABDOMINAL AORTOGRAM W/LOWER EXTREMITY: CATH118223

## 2021-03-02 LAB — BASIC METABOLIC PANEL
Anion gap: 14 (ref 5–15)
BUN: 91 mg/dL — ABNORMAL HIGH (ref 6–20)
CO2: 14 mmol/L — ABNORMAL LOW (ref 22–32)
Calcium: 8.2 mg/dL — ABNORMAL LOW (ref 8.9–10.3)
Chloride: 102 mmol/L (ref 98–111)
Creatinine, Ser: 2.9 mg/dL — ABNORMAL HIGH (ref 0.61–1.24)
GFR, Estimated: 24 mL/min — ABNORMAL LOW (ref >60)
Glucose, Bld: 182 mg/dL — ABNORMAL HIGH (ref 70–99)
Potassium: 4.4 mmol/L (ref 3.5–5.1)
Sodium: 130 mmol/L — ABNORMAL LOW (ref 135–145)

## 2021-03-02 LAB — CBC
HCT: 22.8 % — ABNORMAL LOW (ref 39.0–52.0)
Hemoglobin: 7.1 g/dL — ABNORMAL LOW (ref 13.0–17.0)
MCH: 23.7 pg — ABNORMAL LOW (ref 26.0–34.0)
MCHC: 31.1 g/dL (ref 30.0–36.0)
MCV: 76 fL — ABNORMAL LOW (ref 80.0–100.0)
Platelets: 355 10*3/uL (ref 150–400)
RBC: 3 MIL/uL — ABNORMAL LOW (ref 4.22–5.81)
RDW: 14.6 % (ref 11.5–15.5)
WBC: 9.5 10*3/uL (ref 4.0–10.5)
nRBC: 0 % (ref 0.0–0.2)

## 2021-03-02 LAB — GLUCOSE, CAPILLARY
Glucose-Capillary: 161 mg/dL — ABNORMAL HIGH (ref 70–99)
Glucose-Capillary: 173 mg/dL — ABNORMAL HIGH (ref 70–99)
Glucose-Capillary: 172 mg/dL — ABNORMAL HIGH (ref 70–99)
Glucose-Capillary: 184 mg/dL — ABNORMAL HIGH (ref 70–99)

## 2021-03-02 LAB — HEMOGLOBIN AND HEMATOCRIT, BLOOD
HCT: 22.9 % — ABNORMAL LOW (ref 39.0–52.0)
Hemoglobin: 7 g/dL — ABNORMAL LOW (ref 13.0–17.0)

## 2021-03-02 LAB — IRON AND TIBC
Iron: 14 ug/dL — ABNORMAL LOW (ref 45–182)
Saturation Ratios: 8 % — ABNORMAL LOW (ref 17.9–39.5)
TIBC: 167 ug/dL — ABNORMAL LOW (ref 250–450)
UIBC: 153 ug/dL

## 2021-03-02 LAB — FOLATE: Folate: 7.2 ng/mL (ref >5.9)

## 2021-03-02 LAB — VITAMIN B12: Vitamin B-12: 471 pg/mL (ref 180–914)

## 2021-03-02 LAB — RETICULOCYTES
Immature Retic Fract: 23.2 % — ABNORMAL HIGH (ref 2.3–15.9)
RBC.: 3.01 MIL/uL — ABNORMAL LOW (ref 4.22–5.81)
Retic Count, Absolute: 33.1 10*3/uL (ref 19.0–186.0)
Retic Ct Pct: 1.1 % (ref 0.4–3.1)

## 2021-03-02 LAB — FERRITIN: Ferritin: 365 ng/mL — ABNORMAL HIGH (ref 24–336)

## 2021-03-02 SURGERY — ABDOMINAL AORTOGRAM W/LOWER EXTREMITY
Anesthesia: LOCAL | Laterality: Left

## 2021-03-02 MED ORDER — ENSURE ENLIVE PO LIQD
237.0000 mL | Freq: Two times a day (BID) | ORAL | Status: DC
  Administered 2021-03-03 – 2021-03-04 (×4): 237 mL via ORAL

## 2021-03-02 MED ORDER — MIDAZOLAM HCL 2 MG/2ML IJ SOLN
INTRAMUSCULAR | Status: AC
  Filled 2021-03-02: qty 2

## 2021-03-02 MED ORDER — LIDOCAINE HCL (PF) 1 % IJ SOLN
INTRAMUSCULAR | Status: AC
  Filled 2021-03-02: qty 30

## 2021-03-02 MED ORDER — HEPARIN (PORCINE) IN NACL 1000-0.9 UT/500ML-% IV SOLN
INTRAVENOUS | Status: AC
  Filled 2021-03-02: qty 1000

## 2021-03-02 MED ORDER — FENTANYL CITRATE (PF) 100 MCG/2ML IJ SOLN
INTRAMUSCULAR | Status: AC
  Filled 2021-03-02: qty 2

## 2021-03-02 MED ORDER — HEPARIN (PORCINE) IN NACL 1000-0.9 UT/500ML-% IV SOLN
INTRAVENOUS | Status: DC | PRN
  Administered 2021-03-02 (×2): 500 mL

## 2021-03-02 MED ORDER — MIDAZOLAM HCL 2 MG/2ML IJ SOLN
INTRAMUSCULAR | Status: DC | PRN
  Administered 2021-03-02: 1 mg via INTRAVENOUS

## 2021-03-02 MED ORDER — IODIXANOL 320 MG/ML IV SOLN
INTRAVENOUS | Status: DC | PRN
  Administered 2021-03-02: 15 mL

## 2021-03-02 MED ORDER — LINEZOLID 600 MG/300ML IV SOLN
600.0000 mg | Freq: Two times a day (BID) | INTRAVENOUS | Status: DC
  Administered 2021-03-02 – 2021-03-03 (×3): 600 mg via INTRAVENOUS
  Filled 2021-03-02 (×5): qty 300

## 2021-03-02 MED ORDER — LIDOCAINE HCL (PF) 1 % IJ SOLN
INTRAMUSCULAR | Status: DC | PRN
  Administered 2021-03-02: 18 mL

## 2021-03-02 MED ORDER — FENTANYL CITRATE (PF) 100 MCG/2ML IJ SOLN
INTRAMUSCULAR | Status: DC | PRN
  Administered 2021-03-02: 25 ug via INTRAVENOUS

## 2021-03-02 SURGICAL SUPPLY — 12 items
CATH OMNI FLUSH 5F 65CM (CATHETERS) ×1 IMPLANT
CATH TEMPO AQUA 5F 100CM (CATHETERS) ×1 IMPLANT
DEVICE TORQUE .025-.038 (MISCELLANEOUS) ×1 IMPLANT
GUIDEWIRE ANGLED .035X150CM (WIRE) ×1 IMPLANT
KIT MICROPUNCTURE NIT STIFF (SHEATH) ×1 IMPLANT
KIT PV (KITS) ×2 IMPLANT
SHEATH PINNACLE 5F 10CM (SHEATH) ×1 IMPLANT
SHEATH PROBE COVER 6X72 (BAG) ×1 IMPLANT
SYR MEDRAD MARK V 150ML (SYRINGE) ×1 IMPLANT
TRANSDUCER W/STOPCOCK (MISCELLANEOUS) ×2 IMPLANT
TRAY PV CATH (CUSTOM PROCEDURE TRAY) ×2 IMPLANT
WIRE BENTSON .035X145CM (WIRE) ×1 IMPLANT

## 2021-03-02 NOTE — Op Note (Signed)
DATE OF SERVICE: 03/02/2021  PATIENT:  [name]  61 y.o. male  PRE-OPERATIVE DIAGNOSIS:  Atherosclerosis of native arteries of left lower extremity; necrotizing left foot infection s/p TMA  POST-OPERATIVE DIAGNOSIS:  Same  PROCEDURE:   1) US guided right common femoral artery access 2) Aortogram 3) Left lower extremity angiogram with third order cannulation (23mL total contrast) 4) Conscious sedation (35 minutes)  SURGEON:  [name]. Stanford Breed, MD  ASSISTANT: none  ANESTHESIA:   local and IV sedation  ESTIMATED BLOOD LOSS: minimal  LOCAL MEDICATIONS USED:  LIDOCAINE   COUNTS: confirmed correct.  PATIENT DISPOSITION:  PACU - hemodynamically stable.   Delay start of Pharmacological VTE agent (>24hrs) due to surgical blood loss or risk of bleeding: no  INDICATION FOR PROCEDURE: [name] is a 61 y.o. male with severe peripheral arterial disease by noninvasive testing.  He presented to the [facility], ER with a necrotizing soft tissue infection of his foot.  He was taken urgently to the operating room by the podiatry team for transmetatarsal amputation. After careful discussion of risks, benefits, and alternatives the patient was offered angiography. The patient  understood and wished to proceed.  OPERATIVE FINDINGS:  Terminal aorta and iliac arteries: Widely patent without flow-limiting stenosis  Left lower extremity: Common femoral artery: Widely patent without flow-limiting stenosis Profunda femoris artery: Widely patent without flow-limiting stenosis  Superficial femoral artery: Widely patent without flow-limiting stenosis Popliteal artery: Widely patent without flow-limiting stenosis Anterior tibial artery: Patent at its origin, occludes in the proximal calf Tibioperoneal trunk: patent Peroneal artery: Patent at its origin, occludes in the proximal calf; the small island reconstitutes in the distal calf, but then occludes again. Posterior tibial artery: Patent at its  origin, occludes in the proximal calf Pedal circulation: Virtually no flow seen angiographically  DESCRIPTION OF PROCEDURE: After identification of the patient in the pre-operative holding area, the patient was transferred to the operating room. The patient was positioned supine on the operating room table.  Anesthesia was induced. The groins was prepped and draped in standard fashion. A surgical pause was performed confirming correct patient, procedure, and operative location.  The right groin was anesthetized with subcutaneous injection of 1% lidocaine. Using ultrasound guidance, the right common femoral artery was accessed with micropuncture technique. Fluoroscopy was used to confirm cannulation over the femoral head. The 96F sheath was upsized to 50F.   A Benson wire was advanced into the distal aorta. Over the wire an omni flush catheter was advanced to the level of L2. Aortogram was performed - see above for details.   The left common iliac artery was selected with a Glidewire guidewire. The wire was advanced into the common femoral artery. Over the wire the omni flush catheter was advanced into the external iliac artery. Selective angiography was performed - see above for details.  The Glidewire was then navigated into the popliteal artery and a straight flush catheter advanced over the wire into the popliteal artery to improve visualization of the tibial vessels and allow dilute contrast to be used to best visualize the problem area.  See above for findings.  The sheath was left in place to be removed in the recovery area.  Conscious sedation was administered with the use of IV fentanyl and midazolam under continuous physician and nurse monitoring.  Heart rate, blood pressure, and oxygen saturation were continuously monitored.  Total sedation time was 35 minutes  Upon completion of the case instrument and sharps counts were confirmed correct.  The patient was transferred to the PACU in good  condition. I was present for all portions of the procedure.  PLAN: No options for revascularization.  Needs left below-knee amputation.  Will discuss with family.  [name]. Stanford Breed, MD [facility] Phone Number: [phone] 03/02/2021 2:37 PM

## 2021-03-02 NOTE — Progress Notes (Signed)
Site area: Right groin a 5 french arterial sheath was removed  Site Prior to Removal:  Level 0  Pressure Applied For 20 MINUTES    Bedrest Beginning at 1510pm X 4 hours  Manual:   Yes.    Patient Status During Pull:  stable  Post Pull Groin Site:  Level 0  Post Pull Instructions Given:  Yes.    Post Pull Pulses Present:  Yes.    Dressing Applied:  Yes.    Comments:

## 2021-03-02 NOTE — Progress Notes (Signed)
PROGRESS NOTE  [name] GSU:[id number] DOB: 18-Jan-1961   PCP: Roy Marble, MD  Patient is from: Nursing home.  Legally blind. Bedbound at baseline.  DOA: 02/28/2021 LOS: 1  Chief complaints:  Chief Complaint  Patient presents with   Wound Check     Brief Narrative / Interim history: 61 year old M with PMH of DM-2, diastolic CHF, OPF-2T, glaucoma, HTN, depression, bipolar disorder, blindness and debility/bedbound at baseline brought to ED with diabetic left foot wound infection.  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  Started on IV vancomycin, ceftriaxone and Flagyl.  Podiatry and vascular surgery consulted.  Patient underwent left TMA amputation by Dr. Jacqualyn [name] on 03/01/2021.  ABI with noncompressible RLE arteries and moderate LLE PAD.  Plan for lower extremity angiography on 03/02/2021.  Subjective: Seen and examined earlier this morning.  No major events overnight of this morning.  No complaints but patient is not a great historian.  He is somewhat slow and sluggish.  He is oriented to self and place.  He denies pain, shortness of breath, nausea or vomiting.  Objective: Vitals:   03/01/21 1440 03/01/21 1528 03/01/21 2128 03/02/21 0745  BP: 106/60 130/69 (!) 100/59 104/69  Pulse:  81 86 97  Resp: _0 Temp: 97.9 F (36.6 C)  97.8 F (36.6 C) 99.4 F (37.4 C)  TempSrc:   Oral Oral  SpO2: 99% 98% 100% 100%  Weight:      Height:        Examination:  GENERAL: No apparent distress.  Nontoxic. HEENT: MMM.  Legally blind. NECK: Supple.  No apparent JVD.  RESP: 100% on RA.  No IWOB.  Fair aeration bilaterally. CVS:  RRR. Heart sounds normal.  ABD/GI/GU: BS+. Abd soft, NTND.  MSK/EXT:  Moves extremities.  Dressing over left foot DCI.  DP pulse 2+ in right foot.  Hypertrophic  toenails SKIN: Dressing over left foot NEURO: Awake but not quite alert.  Oriented to self and place.  Follows commands.  No apparent focal neuro deficit. PSYCH: Calm.  Somewhat flat affect.  No distress or agitation.  Procedures:  03/01/2021-left transmetatarsal amputation by Dr. Jacqualyn [name]  Microbiology summarized: COVID-19 and influenza PCR nonreactive. Blood cultures NGTD. Surgical tissue culture pending.  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  Per OR note, I do not think full source control has been achieved yet -Changed IV vancomycin to IV Zyvox given AKI.  Discussed with ID, Dr. Lucianne Lei [name] -Continue IV ceftriaxone and IV Flagyl pending surgical tissue culture -Consult dietitian to optimize nutrition -Plan for angiography of lower extremity today -Continue statin -Appropriate antiplatelet after angiography   AKI on CKD-3A: POA Recent Labs    12/03/20 1610 12/04/20 1317 12/05/20 0832 12/07/20 0254 12/08/20 0743 12/09/20 0734 12/11/20 0531 12/19/20 0006 02/28/21 2350 03/02/21 0242  BUN _1 21* 24* 26* 33* 32* 87* 91*  CREATININE 1.25* 1.31* 1.42* 1.51* 1.08 1.18 1.37* 1.25* 2.33* 2.90*  -Changed vancomycin to IV Zosyn -Continue IV fluid -Monitor urine output and renal function closely   Chronic diastolic CHF: Stable.  Appears euvolemic on exam.  Slightly hypotensive. -  Monitor fluid and respiratory status while on IV fluid -Continue holding Demadex and Entresto given AKI -Discontinued BiDil due to hypotension.   Uncontrolled DM-2 with hyperglycemia, PAD and neuropathy: A1c 8.4%. Recent Labs  Lab 03/01/21 1119 03/01/21 1341 03/01/21 1629 03/01/21 2127 03/02/21 0823  GLUCAP 181* 163* 156* 141* 161*  -Continue current insulin regimen -Continue statin    History of depression and bipolar disorder: Stable. -Continue Zoloft   Hypotension/history of essential hypertension: Hypotensive to 70s/50s overnight but was not tachycardic. -Continue IV fluid  -Discontinue BiDil -Continue holding Entresto and Demadex. -We will discontinue Norvasc if BP remains soft.   Hyponatremia: Likely from AKI and CKD. -Continue monitoring  Anemia of renal disease: Hgb down to 7.1 this morning.  Surgical blood loss?  There could be some element of hemodilution. Recent Labs    12/02/20 1328 12/03/20 1610 12/04/20 1317 12/05/20 0832 12/07/20 0254 12/08/20 0743 12/09/20 0734 12/19/20 0006 02/28/21 2350 03/02/21 0242  HGB 8.0* 9.1* 9.3* 9.3* 8.1* 8.7* 8.1* 8.9* 8.0* 7.1*  -Check anemia panel -Recheck H&H -Transfuse for Hgb <7.0.  Glaucoma/blindness -Continue dorzolamide, brimonidine  Debility: Reportedly bedbound at baseline.  He is from nursing home.  Goals of care counseling: Not able to talk this with patient as he is not quite alert and not forthcoming.  Talked to patient's brother, [name] who helps with medical decision making. [name] says that they never had discussion about goal of care including CPR, intubation and blood transfusion... [name] will be coming to visit later today.  He says he will talk to him to find out about his wishes and preferences.  I have discussed about [name] medical condition, fragility and pros and cons of CPR or intubation.  I offered palliative care consult to facilitate further discussion.  He appreciated the consult. -Palliative care consulted.  Class I obesity/malnutrition: Prealbumin 7.9 Body mass index is 30.56 kg/m.        DVT prophylaxis:  heparin injection 5,000 Units Start: 03/01/21 2200  Code Status: Full code Family Communication: Updated patient's brother over the phone. Level of care: Med-Surg Status is: Inpatient  Remains inpatient appropriate because: Diabetic wound infection of left foot requiring  IV antibiotics and further surgical intervention, PAD with gangrene requiring further procedure for evaluation       Consultants:  Podiatry Vascular surgery Palliative medicine   Sch Meds:  Scheduled Meds:  amLODipine  10 mg Oral Daily   atorvastatin  80 mg Oral QHS   brimonidine  1 drop Both Eyes BID   dorzolamide  1 drop Both Eyes BID   heparin  5,000 Units Subcutaneous Q8H   insulin aspart  0-5 Units Subcutaneous QHS   insulin aspart  0-9 Units Subcutaneous TID WC   isosorbide-hydrALAZINE  1 tablet Oral TID   pantoprazole  40 mg Oral Daily   pregabalin  75 mg Oral BID   saccharomyces boulardii  250 mg Oral BID   senna-docusate  1 tablet Oral Daily   sertraline  50 mg Oral QHS   tamsulosin  0.8 mg Oral Daily   Continuous Infusions:  sodium chloride 75 mL/hr at 03/01/21 2216   sodium chloride Stopped (03/01/21 2219)   cefTRIAXone (ROCEPHIN)  IV 2 g (03/01/21 1735)   linezolid (ZYVOX) IV 600 mg (03/02/21 1059)   metronidazole 500 mg (03/02/21 0849)   PRN Meds:.acetaminophen **OR** acetaminophen, bisacodyl, fentaNYL (SUBLIMAZE) injection, hydrALAZINE, LORazepam, ondansetron **OR** ondansetron (ZOFRAN) IV, oxyCODONE, polyethylene glycol  Antimicrobials: Anti-infectives (From admission, onward)    Start  Dose/Rate Route Frequency Ordered Stop   03/02/21 1000  linezolid (ZYVOX) IVPB 600 mg        600 mg 300 mL/hr over 60 Minutes Intravenous Every 12 hours 03/02/21 0717     03/01/21 1600  cefTRIAXone (ROCEPHIN) 2 g in sodium chloride 0.9 % 100 mL IVPB        2 g 200 mL/hr over 30 Minutes Intravenous Every 24 hours 03/01/21 1507 03/08/21 1559   03/01/21 1600  metroNIDAZOLE (FLAGYL) IVPB 500 mg        500 mg 100 mL/hr over 60 Minutes Intravenous Every 8 hours 03/01/21 1507 03/08/21 1559   03/01/21 1529  vancomycin variable dose per unstable renal function (pharmacist dosing)  Status:  Discontinued         Does not apply See admin instructions 03/01/21 1529 03/02/21  0717   03/01/21 0015  vancomycin (VANCOREADY) IVPB 2000 mg/400 mL        2,000 mg 200 mL/hr over 120 Minutes Intravenous  Once 03/01/21 0001 03/01/21 0725   03/01/21 0015  clindamycin (CLEOCIN) IVPB 900 mg        900 mg 100 mL/hr over 30 Minutes Intravenous  Once 03/01/21 0010 03/01/21 0104   03/01/21 0015  piperacillin-tazobactam (ZOSYN) IVPB 3.375 g        3.375 g 100 mL/hr over 30 Minutes Intravenous  Once 03/01/21 0012 03/01/21 0216        I have personally reviewed the following labs and images: CBC: Recent Labs  Lab 02/28/21 2350 03/02/21 0242  WBC 12.0* 9.5  NEUTROABS 8.9*  --   HGB 8.0* 7.1*  HCT 26.0* 22.8*  MCV 75.4* 76.0*  PLT 391 355   BMP &GFR Recent Labs  Lab 02/28/21 2350 03/02/21 0242  NA 129* 130*  K 4.1 4.4  CL 101 102  CO2 16* 14*  GLUCOSE 210* 182*  BUN 87* 91*  CREATININE 2.33* 2.90*  CALCIUM 8.3* 8.2*   Estimated Creatinine Clearance: 30.1 mL/min (A) (by C-G formula based on SCr of 2.9 mg/dL (H)). Liver & Pancreas: Recent Labs  Lab 02/28/21 2350  AST 11*  ALT 14  ALKPHOS 107  BILITOT 0.9  PROT 7.2  ALBUMIN 2.1*   No results for input(s): LIPASE, AMYLASE in the last 168 hours. No results for input(s): AMMONIA in the last 168 hours. Diabetic: Recent Labs    03/01/21 1552  HGBA1C 8.4*   Recent Labs  Lab 03/01/21 1119 03/01/21 1341 03/01/21 1629 03/01/21 2127 03/02/21 0823  GLUCAP 181* 163* 156* 141* 161*   Cardiac Enzymes: No results for input(s): CKTOTAL, CKMB, CKMBINDEX, TROPONINI in the last 168 hours. No results for input(s): PROBNP in the last 8760 hours. Coagulation Profile: No results for input(s): INR, PROTIME in the last 168 hours. Thyroid Function Tests: No results for input(s): TSH, T4TOTAL, FREET4, T3FREE, THYROIDAB in the last 72 hours. Lipid Profile: No results for input(s): CHOL, HDL, LDLCALC, TRIG, CHOLHDL, LDLDIRECT in the last 72 hours. Anemia Panel: No results for input(s): VITAMINB12, FOLATE,  FERRITIN, TIBC, IRON, RETICCTPCT in the last 72 hours. Urine analysis:    Component Value Date/Time   COLORURINE YELLOW 12/19/2020 0103   APPEARANCEUR CLOUDY (A) 12/19/2020 0103   APPEARANCEUR Clear 12/31/2013 2116   LABSPEC 1.007 12/19/2020 0103   LABSPEC 1.032 12/31/2013 2116   PHURINE 5.0 12/19/2020 0103   GLUCOSEU >=500 (A) 12/19/2020 0103   GLUCOSEU >=500 12/31/2013 2116   HGBUR SMALL (A) 12/19/2020 0103   BILIRUBINUR NEGATIVE 12/19/2020 0103  BILIRUBINUR Negative 12/31/2013 2116   KETONESUR NEGATIVE 12/19/2020 0103   PROTEINUR 100 (A) 12/19/2020 0103   UROBILINOGEN 1.0 08/28/2013 1709   NITRITE NEGATIVE 12/19/2020 0103   LEUKOCYTESUR LARGE (A) 12/19/2020 0103   LEUKOCYTESUR Negative 12/31/2013 2116   Sepsis Labs: Invalid input(s): PROCALCITONIN, [place]  Microbiology: Recent Results (from the past 240 hour(s))  Resp Panel by RT-PCR (Flu A&B, Covid) Nasopharyngeal Swab     Status: None   Collection Time: 03/01/21  8:20 AM   Specimen: Nasopharyngeal Swab; Nasopharyngeal(NP) swabs in vial transport medium  Result Value Ref Range Status   SARS Coronavirus 2 by RT PCR NEGATIVE NEGATIVE Final    Comment: (NOTE) SARS-CoV-2 target nucleic acids are NOT DETECTED.  The SARS-CoV-2 RNA is generally detectable in upper respiratory specimens during the acute phase of infection. The lowest concentration of SARS-CoV-2 viral copies this assay can detect is 138 copies/mL. A negative result does not preclude SARS-Cov-2 infection and should not be used as the sole basis for treatment or other patient management decisions. A negative result may occur with  improper specimen collection/handling, submission of specimen other than nasopharyngeal swab, presence of viral mutation(s) within the areas targeted by this assay, and inadequate number of viral copies(<138 copies/mL). A negative result must be combined with clinical observations, patient history, and  epidemiological information. The expected result is Negative.  Fact Sheet for Patients:  EntrepreneurPulse.com.au  Fact Sheet for Healthcare Providers:  IncredibleEmployment.be  This test is no t yet approved or cleared by the [place] FDA and  has been authorized for detection and/or diagnosis of SARS-CoV-2 by FDA under an Emergency Use Authorization (EUA). This EUA will remain  in effect (meaning this test can be used) for the duration of the COVID-19 declaration under Section 564(b)(1) of the Act, 21 U.S.C.section 360bbb-3(b)(1), unless the authorization is terminated  or revoked sooner.       Influenza A by PCR NEGATIVE NEGATIVE Final   Influenza B by PCR NEGATIVE NEGATIVE Final    Comment: (NOTE) The Xpert Xpress SARS-CoV-2/FLU/RSV plus assay is intended as an aid in the diagnosis of influenza from Nasopharyngeal swab specimens and should not be used as a sole basis for treatment. Nasal washings and aspirates are unacceptable for Xpert Xpress SARS-CoV-2/FLU/RSV testing.  Fact Sheet for Patients: EntrepreneurPulse.com.au  Fact Sheet for Healthcare Providers: IncredibleEmployment.be  This test is not yet approved or cleared by the [place] FDA and has been authorized for detection and/or diagnosis of SARS-CoV-2 by FDA under an Emergency Use Authorization (EUA). This EUA will remain in effect (meaning this test can be used) for the duration of the COVID-19 declaration under Section 564(b)(1) of the Act, 21 U.S.C. section 360bbb-3(b)(1), unless the authorization is terminated or revoked.  Performed at [facility], [place] [address], [place], [place] [address]   Blood Cultures x 2 sites     Status: None (Preliminary result)   Collection Time: 03/01/21  3:45 PM   Specimen: BLOOD LEFT HAND  Result Value Ref Range Status   Specimen Description BLOOD LEFT HAND  Final   Special Requests    Final    BOTTLES DRAWN AEROBIC AND ANAEROBIC Blood Culture adequate volume   Culture   Final    NO GROWTH < 24 HOURS Performed at [facility], [place] [address] [place], [place] [address]    Report Status PENDING  Incomplete  Blood Cultures x 2 sites     Status: None (Preliminary result)   Collection  Time: 03/01/21  3:53 PM   Specimen: BLOOD RIGHT ARM  Result Value Ref Range Status   Specimen Description BLOOD RIGHT ARM  Final   Special Requests   Final    BOTTLES DRAWN AEROBIC AND ANAEROBIC Blood Culture results may not be optimal due to an excessive volume of blood received in culture bottles   Culture   Final    NO GROWTH < 24 HOURS Performed at [facility], [address], [place], [place] [address]    Report Status PENDING  Incomplete    Radiology Studies: DG Foot Complete Left  Result Date: 03/01/2021 CLINICAL DATA:  Postop, wound check. EXAM: LEFT FOOT - COMPLETE 3+ VIEW COMPARISON:  None. FINDINGS: Status post transmetatarsal amputation. Surgical drain in place. Diffuse osteopenia. Prominent vascular calcifications. IMPRESSION: Status post transmetatarsal amputation of the forefoot with surgical drain in place. Electronically Signed   By: Keane Police D.O.   On: 03/01/2021 16:13      Ayushi Pla T. [place]  If 7PM-7AM, please contact night-coverage [email or web address] 03/02/2021, 11:05 AM

## 2021-03-02 NOTE — Progress Notes (Signed)
[facility] [place] PROGRESS NOTE  ASSESSMENT / PLAN: [name] is a 61 y.o. male status post left TMA for necrotizing infection. Preoperative non-invasive studies show significant arterial disease.  Angiogram is indicated for attempt at limb salvage.  SUBJECTIVE: No complaints.  Explained rationale for procedure today.  OBJECTIVE: BP 120/76    Pulse 90    Temp 99.4 F (37.4 C) (Oral)    Resp (!) 4    Ht 5\' 8"  (1.727 m)    Wt 91.2 kg    SpO2 100%    BMI 30.56 kg/m   Intake/Output Summary (Last 24 hours) at 03/02/2021 1436 Last data filed at 03/02/2021 0409 Gross per 24 hour  Intake 816.87 ml  Output 0 ml  Net 816.87 ml    No distress Left transmetatarsal amputation bandage clean dry JP with serosanguineous drainage.  CBC Latest Ref Rng & Units 03/02/2021 03/02/2021 02/28/2021  WBC 4.0 - 10.5 K/uL - 9.5 12.0(H)  Hemoglobin 13.0 - 17.0 g/dL 7.0(L) 7.1(L) 8.0(L)  Hematocrit 39.0 - 52.0 % 22.9(L) 22.8(L) 26.0(L)  Platelets 150 - 400 K/uL - 355 391     CMP Latest Ref Rng & Units 03/02/2021 02/28/2021 12/19/2020  Glucose 70 - 99 mg/dL 182(H) 210(H) 143(H)  BUN 6 - 20 mg/dL 91(H) 87(H) 32(H)  Creatinine 0.61 - 1.24 mg/dL 2.90(H) 2.33(H) 1.25(H)  Sodium 135 - 145 mmol/L 130(L) 129(L) 134(L)  Potassium 3.5 - 5.1 mmol/L 4.4 4.1 3.9  Chloride 98 - 111 mmol/L 102 101 99  CO2 22 - 32 mmol/L 14(L) 16(L) 25  Calcium 8.9 - 10.3 mg/dL 8.2(L) 8.3(L) 8.4(L)  Total Protein 6.5 - 8.1 g/dL - 7.2 -  Total Bilirubin 0.3 - 1.2 mg/dL - 0.9 -  Alkaline Phos 38 - 126 U/L - 107 -  AST 15 - 41 U/L - 11(L) -  ALT 0 - 44 U/L - 14 -    Estimated Creatinine Clearance: 30.1 mL/min (A) (by C-G formula based on SCr of 2.9 mg/dL (H)).  [name]. Stanford Breed, MD [facility] Phone Number: [phone] 03/02/2021 2:36 PM

## 2021-03-02 NOTE — Progress Notes (Signed)
Initial Nutrition Assessment  DOCUMENTATION CODES:   Not applicable  INTERVENTION:  -Ensure Enlive po BID, each supplement provides 350 kcal and 20 grams of protein  NUTRITION DIAGNOSIS:   Increased nutrient needs related to wound healing as evidenced by estimated needs.  GOAL:   Patient will meet greater than or equal to 90% of their needs  MONITOR:   PO intake, Supplement acceptance, Labs, Weight trends, Skin  REASON FOR ASSESSMENT:   Consult Wound healing  ASSESSMENT:   61 year old M with PMH of DM-2, diastolic CHF, AQT-6A, glaucoma, HTN, depression, bipolar disorder, blindness and debility/bedbound at baseline brought to ED with diabetic left foot wound infection.  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  Started on IV vancomycin, ceftriaxone and Flagyl.  Podiatry and vascular surgery consulted.  Patient underwent left TMA amputation by Dr. Jacqualyn Posey on 03/01/2021.  ABI with noncompressible RLE arteries and moderate LLE PAD.  Plan for lower extremity angiography on 03/02/2021.  1/05 - s/p L transmetatarsal amputation   Pt out of room at time of visit. Per RN, pt in cath lab.   Weight history reviewed. Pt weighed 91.2 kg on admit and 119.5 kg on 12/08/20. This indicates a clinically significant 23.7% weight loss x 3 months. Suspect pt meets criteria for malnutrition but unable to diagnose at this time without detailed diet history and/or nutrition focused physical exam.   No PO Intake documented   Medications: Scheduled Meds:  [MAR Hold] amLODipine  10 mg Oral Daily   [MAR Hold] atorvastatin  80 mg Oral QHS   [MAR Hold] brimonidine  1 drop Both Eyes BID   [MAR Hold] dorzolamide  1 drop Both Eyes BID   [MAR Hold] heparin  5,000 Units Subcutaneous Q8H   [MAR Hold] insulin aspart  0-5  Units Subcutaneous QHS   [MAR Hold] insulin aspart  0-9 Units Subcutaneous TID WC   [MAR Hold] pantoprazole  40 mg Oral Daily   [MAR Hold] pregabalin  75 mg Oral BID   [MAR Hold] saccharomyces boulardii  250 mg Oral BID   [MAR Hold] senna-docusate  1 tablet Oral Daily   [MAR Hold] sertraline  50 mg Oral QHS   [MAR Hold] tamsulosin  0.8 mg Oral Daily  Continuous Infusions:  sodium chloride 75 mL/hr at 03/01/21 2216   sodium chloride 10 mL/hr (03/02/21 1315)   [MAR Hold] cefTRIAXone (ROCEPHIN)  IV 2 g (03/01/21 1735)   [MAR Hold] linezolid (ZYVOX) IV 600 mg (03/02/21 1059)   [MAR Hold] metronidazole 500 mg (03/02/21 0849)    Labs: Recent Labs  Lab 02/28/21 2350 03/02/21 0242  NA 129* 130*  K 4.1 4.4  CL 101 102  CO2 16* 14*  BUN 87* 91*  CREATININE 2.33* 2.90*  CALCIUM 8.3* 8.2*  GLUCOSE 210* 182*  CBGs: 141-173 x24 hours    NUTRITION - FOCUSED PHYSICAL EXAM: Unable to complete at this time. Will attempt at follow-up.   Diet Order:   Diet Order             Diet NPO time specified Except for: Sips with Meds  Diet effective now                   EDUCATION NEEDS:   No education needs have been identified at this time  Skin:  Last BM:     Height:   Ht Readings from Last 1 Encounters:  03/01/21 5\' 8"  (1.727 m)    Weight:   Wt Readings from Last 10 Encounters:  03/01/21 91.2 kg  02/02/21 98.9 kg  12/08/20 119.5 kg  09/22/20 127.8 kg  08/17/20 115.7 kg  08/07/20 115.7 kg  08/05/20 114.8 kg  07/26/20 114.8 kg  07/24/20 114.8 kg  07/11/20 115 kg        BMI:  Body mass index is 30.56 kg/m.  Estimated Nutritional Needs:   Kcal:  2200-2400  Protein:  110-120 grams  Fluid:  >2.2L     Theone Stanley., MS, RD, LDN (she/her/hers) RD pager number and weekend/on-call pager number located in [facility] [place].

## 2021-03-02 NOTE — Plan of Care (Signed)
°  Problem: Education: Goal: Knowledge of General Education information will improve Description: Including pain rating scale, medication(s)/side effects and non-pharmacologic comfort measures Outcome: Not Progressing   Problem: Health Behavior/Discharge Planning: Goal: Ability to manage health-related needs will improve Outcome: Not Progressing   Problem: Clinical Measurements: Goal: Ability to maintain clinical measurements within normal limits will improve Outcome: Not Progressing Goal: Will remain free from infection Outcome: Not Progressing Goal: Diagnostic test results will improve Outcome: Not Progressing Goal: Respiratory complications will improve Outcome: Not Progressing Goal: Cardiovascular complication will be avoided Outcome: Not Progressing   Problem: Nutrition: Goal: Adequate nutrition will be maintained Outcome: Not Progressing   Problem: Elimination: Goal: Will not experience complications related to bowel motility Outcome: Not Progressing Goal: Will not experience complications related to urinary retention Outcome: Not Progressing   Problem: Pain Managment: Goal: General experience of comfort will improve Outcome: Not Progressing   Problem: Safety: Goal: Ability to remain free from injury will improve Outcome: Not Progressing   Problem: Skin Integrity: Goal: Risk for impaired skin integrity will decrease Outcome: Not Progressing

## 2021-03-02 NOTE — Progress Notes (Signed)
Pt admitted to [facility] from cath lab.  Pt is A&O X4.  Femoral site is clean dry and intact and level 0 with no signs of bleeding or hematoma.  Pt is currently comfortable and not in pain.  Vitals taken and within normal range. Pt has a stage 2 pressure ulcer on sacrum upon admission foam dressing applied.

## 2021-03-02 NOTE — Progress Notes (Signed)
Patient in cath lab. Will plan to see patient tomorrow.

## 2021-03-03 DIAGNOSIS — N179 Acute kidney failure, unspecified: Secondary | ICD-10-CM | POA: Diagnosis not present

## 2021-03-03 DIAGNOSIS — I5032 Chronic diastolic (congestive) heart failure: Secondary | ICD-10-CM | POA: Diagnosis not present

## 2021-03-03 DIAGNOSIS — Z7189 Other specified counseling: Secondary | ICD-10-CM | POA: Diagnosis not present

## 2021-03-03 DIAGNOSIS — Z515 Encounter for palliative care: Secondary | ICD-10-CM

## 2021-03-03 DIAGNOSIS — E11628 Type 2 diabetes mellitus with other skin complications: Secondary | ICD-10-CM | POA: Diagnosis not present

## 2021-03-03 LAB — GLUCOSE, CAPILLARY
Glucose-Capillary: 167 mg/dL — ABNORMAL HIGH (ref 70–99)
Glucose-Capillary: 188 mg/dL — ABNORMAL HIGH (ref 70–99)
Glucose-Capillary: 181 mg/dL — ABNORMAL HIGH (ref 70–99)
Glucose-Capillary: 156 mg/dL — ABNORMAL HIGH (ref 70–99)

## 2021-03-03 LAB — RENAL FUNCTION PANEL
Albumin: 2 g/dL — ABNORMAL LOW (ref 3.5–5.0)
Anion gap: 12 (ref 5–15)
BUN: 95 mg/dL — ABNORMAL HIGH (ref 6–20)
CO2: 15 mmol/L — ABNORMAL LOW (ref 22–32)
Calcium: 8.2 mg/dL — ABNORMAL LOW (ref 8.9–10.3)
Chloride: 107 mmol/L (ref 98–111)
Creatinine, Ser: 2.91 mg/dL — ABNORMAL HIGH (ref 0.61–1.24)
GFR, Estimated: 24 mL/min — ABNORMAL LOW (ref >60)
Glucose, Bld: 176 mg/dL — ABNORMAL HIGH (ref 70–99)
Phosphorus: 6.3 mg/dL — ABNORMAL HIGH (ref 2.5–4.6)
Potassium: 4 mmol/L (ref 3.5–5.1)
Sodium: 134 mmol/L — ABNORMAL LOW (ref 135–145)

## 2021-03-03 LAB — CBC
HCT: 22.8 % — ABNORMAL LOW (ref 39.0–52.0)
Hemoglobin: 6.9 g/dL — CL (ref 13.0–17.0)
MCH: 22.8 pg — ABNORMAL LOW (ref 26.0–34.0)
MCHC: 30.3 g/dL (ref 30.0–36.0)
MCV: 75.5 fL — ABNORMAL LOW (ref 80.0–100.0)
Platelets: 356 10*3/uL (ref 150–400)
RBC: 3.02 MIL/uL — ABNORMAL LOW (ref 4.22–5.81)
RDW: 14.8 % (ref 11.5–15.5)
WBC: 7.2 10*3/uL (ref 4.0–10.5)
nRBC: 0 % (ref 0.0–0.2)

## 2021-03-03 LAB — HEMOGLOBIN AND HEMATOCRIT, BLOOD
HCT: 26 % — ABNORMAL LOW (ref 39.0–52.0)
Hemoglobin: 8.2 g/dL — ABNORMAL LOW (ref 13.0–17.0)

## 2021-03-03 LAB — PREPARE RBC (CROSSMATCH)

## 2021-03-03 LAB — MAGNESIUM: Magnesium: 2.2 mg/dL (ref 1.7–2.4)

## 2021-03-03 MED ORDER — SODIUM CHLORIDE 0.9% IV SOLUTION: Freq: Once | INTRAVENOUS | Status: DC

## 2021-03-03 MED ORDER — INSULIN GLARGINE-YFGN 100 UNIT/ML ~~LOC~~ SOLN
3.0000 [IU] | Freq: Every day | SUBCUTANEOUS | Status: DC
  Administered 2021-03-03 – 2021-03-12 (×9): 3 [IU] via SUBCUTANEOUS
  Filled 2021-03-03 (×11): qty 0.03

## 2021-03-03 MED ORDER — AMLODIPINE BESYLATE 5 MG PO TABS
5.0000 mg | ORAL_TABLET | Freq: Every day | ORAL | Status: DC
  Administered 2021-03-04 – 2021-03-12 (×8): 5 mg via ORAL
  Filled 2021-03-03 (×9): qty 1

## 2021-03-03 NOTE — TOC Initial Note (Signed)
Transition of Care [facility]) - Initial/Assessment Note    Patient Details  Name: [name] MRN: [id number] Date of Birth: 1960-02-27  Transition of [facility]) CM/SW Contact:    Vinie Sill, LCSW Phone Number: 03/03/2021, 11:58 AM  Clinical Narrative:                  CSW met with patient at bedside. CSW introduced self and explained role. Patient confirmed he is from [place]. Patient is agreeable to returning back to [facility] once medially stable. Patient states staff does not get along there- CSW inquired if there are any specific concerns that CSW can discuss with facility. Patient states "it wont make any difference". Patient states no concerns or questions at this time.  Thurmond Butts, MSW, LCSW Clinical Social Worker    Expected Discharge Plan: Skilled Nursing Facility Barriers to Discharge: Continued Medical Work up   Patient Goals and CMS Choice        Expected Discharge Plan and Services Expected Discharge Plan: [place] In-house Referral: Clinical Social Work     Living arrangements for the past 2 months: [place]                                      Prior Living Arrangements/Services Living arrangements for the past 2 months: [facility] Lives with:: Facility Resident Patient language and need for interpreter reviewed:: No        Need for Family Participation in Patient Care: Yes (Comment)     Criminal Activity/Legal Involvement Pertinent to Current Situation/Hospitalization: No - Comment as needed  Activities of Daily Living Home Assistive Devices/Equipment: Cane (specify quad or straight) (straight) ADL Screening (condition at time of admission) Patient's cognitive ability adequate to safely complete daily activities?: Yes Is the patient deaf or have difficulty hearing?: No Does the patient have difficulty seeing, even when wearing glasses/contacts?: Yes (legally blind) Does the  patient have difficulty concentrating, remembering, or making decisions?: Yes Patient able to express need for assistance with ADLs?: Yes Does the patient have difficulty dressing or bathing?: Yes Independently performs ADLs?: No Communication: Independent, Appropriate for developmental age Dressing (OT): Needs assistance Is this a change from baseline?: Pre-admission baseline Grooming: Needs assistance Is this a change from baseline?: Pre-admission baseline Feeding: Needs assistance Is this a change from baseline?: Pre-admission baseline Bathing: Needs assistance Is this a change from baseline?: Pre-admission baseline Toileting: Needs assistance Is this a change from baseline?: Pre-admission baseline In/Out Bed: Needs assistance Is this a change from baseline?: Pre-admission baseline Walks in Home:  (bed bound) Does the patient have difficulty walking or climbing stairs?: Yes Weakness of Legs: Both Weakness of Arms/Hands: Both  Permission Sought/Granted Permission sought to share information with : Family Supports Permission granted to share information with : Yes, Verbal Permission Granted  Share Information with NAME: Ellwyn Ergle     Permission granted to share info w Relationship: brother  Permission granted to share info w Contact Information: [phone]  Emotional Assessment   Attitude/Demeanor/Rapport:  (sleepy) Affect (typically observed): Appropriate Orientation: : Oriented to Self, Oriented to Place, Oriented to  Time, Oriented to Situation Alcohol / Substance Use: Not Applicable Psych Involvement: No (comment)  Admission diagnosis:  Diabetic foot infection ([place]) [H84.696, L08.9] Osteomyelitis of left foot, unspecified type [facility]) [M86.9] Patient Active Problem List   Diagnosis Date Noted   Diabetic foot infection (  [place]) 03/01/2021   PAD (peripheral artery disease) ([place]) 03/01/2021   Glaucoma (increased eye pressure) 03/01/2021   Pressure injury of skin  11/19/2020   Ileus ([place]) 11/18/2020   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   Depression 11/18/2020   Acute renal failure superimposed on stage 3a chronic kidney disease ([place]) 11/18/2020   BPH (benign prostatic hyperplasia) 11/18/2020   AKI (acute kidney injury) ([place]) 08/22/2020   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 08/22/2020   Pericardial effusion 08/22/2020   Acute on chronic diastolic CHF (congestive heart failure) ([place]) 08/22/2020   TIA (transient ischemic attack) 07/03/2020   Acute on chronic heart failure with preserved ejection fraction ([place])    Pulmonary artery hypertension ([place]) 06/25/2020   History of cerebrovascular accident (CVA) with residual deficit 06/25/2020   CKD stage IIIa ([place]) 03/30/2020   Morbid (severe) obesity due to excess calories ([place]) 03/30/2020   Malnutrition of moderate degree 01/25/2020   Palliative care by specialist    Goals of care, counseling/discussion    Nephrotic syndrome 01/06/2020   Anasarca 01/06/2020   Hypokalemia due to loss of potassium 01/04/2020   Generalized body aches 01/01/2020   Non-traumatic rhabdomyolysis 01/01/2020   Chronic diastolic CHF (congestive heart failure) ([place]) 01/01/2020   Microcytic anemia 01/01/2020   Hypokalemia 12/31/2019   Elevated CK    Uncontrolled type 2 diabetes mellitus with hyperglycemia ([place])    Pure hypercholesterolemia    Hyperglycemia 09/28/2017   Nonobstructive atherosclerosis of coronary artery 09/28/2017   MDD (major depressive disorder), recurrent episode, severe ([place]) 07/30/2017   Osteoarthritis 04/12/2015   Severe recurrent major depression with psychotic features ([place]) 01/04/2015   Substance-related disorder ([place]) 10/30/2014   Patient's noncompliance with other medical treatment and regimen 06/07/2014   Cocaine dependence, uncomplicated ([place]) 31/51/7616   Chronic pain 05/31/2014   Cocaine abuse ([place]) 05/31/2014   History of noncompliance with medical treatment 05/31/2014   Malingering  05/31/2014   Leukopenia 05/31/2014   Diabetic neuropathy associated with diabetes mellitus due to underlying condition ([place]) 05/31/2014   Diabetic neuropathy ([place]) 05/31/2014   Depression, major, recurrent, severe with psychosis ([place]) 08/29/2013   Suicidal ideations 08/28/2013   Uncontrolled diabetes mellitus 06/30/2013   Atypical chest pain 12/09/2011   Essential hypertension 12/09/2011   Episodic mood disorder ([place]) 05/13/2011   PCP:  Jodi Marble, MD Pharmacy:   [place], [place] - [address] [place] [address] Phone: [phone] Fax: [phone]     Social Determinants of Health (SDOH) Interventions    Readmission Risk Interventions Readmission Risk Prevention Plan 01/12/2020  Transportation Screening Complete  Palliative Care Screening Not Applicable  Some recent data might be hidden

## 2021-03-03 NOTE — Progress Notes (Signed)
PROGRESS NOTE  [name] RNH:[id number] DOB: 31-Oct-1960   PCP: Jodi Marble, MD  Patient is from: Nursing home.  Legally blind. Bedbound at baseline.  DOA: 02/28/2021 LOS: 2  Chief complaints:  Chief Complaint  Patient presents with   Wound Check     Brief Narrative / Interim history: 61 year old M with PMH of DM-2, diastolic CHF, XOV-2N, glaucoma, HTN, depression, bipolar disorder, blindness and debility/bedbound at baseline brought to ED with diabetic left foot wound infection.  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  Started on IV vancomycin, ceftriaxone and Flagyl.  Podiatry and vascular surgery consulted.  Patient underwent left TMA amputation by Dr. Jacqualyn Posey on 03/01/2021.  ABI with noncompressible RLE arteries and moderate LLE PAD.  Underwent LLE angiography with no visible pedal circulation to revascularize.  Vascular surgery recommended BKA, likely next week.  Palliative medicine consulted  Subjective: Seen and examined earlier this morning.  No major events overnight of this morning.  No complaints but not a great historian.  He states he is too early to know how he is feeling.  He was agreeable to blood transfusion after some agitation.  He denies pain, shortness of breath, nausea or vomiting.  Objective: Vitals:   03/03/21 0320 03/03/21 0830 03/03/21 0852 03/03/21 1136  BP: 109/69 114/73  100/68  Pulse: 85 84 82 81  Resp: 17 15 11 14   Temp: 97.8 F (36.6 C) (!) 97.5 F (36.4 C)  97.8 F (36.6 C)  TempSrc: Oral Oral  Oral  SpO2: 100% 99% 100% 100%  Weight: 91.7 kg     Height:        Examination:  GENERAL: No apparent distress.  Nontoxic. HEENT: MMM.  Legally blind. NECK: Supple.  No apparent JVD.  RESP: Under percent on RA.  No IWOB.  Fair aeration bilaterally. CVS:   RRR. Heart sounds normal.  ABD/GI/GU: BS+. Abd soft, NTND.  MSK/EXT:  Moves extremities.  Significant muscle mass and subcu fat loss. SKIN: Dressing over LLE DCI.  Drain in place.  Hypertrophic toenails. NEURO: Awake and alert. Oriented fairly with fair insight.  No apparent focal neuro deficit. PSYCH: Calm.  Somewhat flat affect.  Procedures:  03/01/2021-left transmetatarsal amputation by Dr. Jacqualyn Posey 03/02/2021-LLE angiogram with no visible pedal circulation  Microbiology summarized: COVID-19 and influenza PCR nonreactive. Blood cultures NGTD. Surgical tissue culture Proteus mirabilis.  Assessment & Plan: Diabetic wound infection of left foot  Osteomyelitis of left foot -CRP and ESR elevated. 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  Per OR note, I do not think full source control has been achieved yet -Discontinue Zyvox and Flagyl -Continue IV ceftriaxone -Consult dietitian to optimize nutrition -Manage PAD/gangrene as below.  Lower extremity PAD with gangrene -ABI with noncompressible arteries in RLE and mod PAD in LLE.   -LE angiogram with no visible pedal circulation to revascularize. -Vascular surgery recommended left BKA-likely next week. -Continue statin and aspirin  AKI/azotemia on CKD-3A: POA. Cr seems to have plateaued. Recent Labs    12/04/20 1317 12/05/20 0832 12/07/20 0254 12/08/20 0743 12/09/20 0734 12/11/20 0531 12/19/20 0006 02/28/21 2350 03/02/21 0242 03/03/21 0818  BUN 18 17 21* 24* 26* 33* 32* 87* 91* 95*  CREATININE 1.31* 1.42* 1.51* 1.08 1.18 1.37* 1.25* 2.33* 2.90* 2.91*  -Continue IV fluid -Monitor urine output and renal function  closely   Chronic diastolic CHF: Stable.  Appears euvolemic on exam.  Slightly hypotensive. -Monitor fluid and respiratory status while on IV fluid -Continue holding Demadex and Entresto given AKI -Discontinued BiDil due to hypotension.   Uncontrolled DM-2 with hyperglycemia, PAD and neuropathy: A1c 8.4%. Recent  Labs  Lab 03/02/21 1134 03/02/21 1453 03/02/21 2030 03/03/21 0622 03/03/21 1135  GLUCAP 173* 172* 184* 167* 188*  -Continue SSI-sensitive -Add basal insulin 3 units daily -Continue statin   History of depression and bipolar disorder: Stable. -Continue Zoloft   Hypotension/history of essential hypertension: Resolved. -Continue IV fluid  -Decrease amlodipine to 5 mg daily -Continue holding Entresto, Demadex and BiDil.   Hyponatremia: Likely from AKI and CKD. -Continue monitoring  Anemia of renal disease: Hgb down to 7.1 this morning.  May be some surgical blood loss and hemodilution. Recent Labs    12/04/20 1317 12/05/20 0832 12/07/20 0254 12/08/20 0743 12/09/20 0734 12/19/20 0006 02/28/21 2350 03/02/21 0242 03/02/21 1151 03/03/21 0818  HGB 9.3* 9.3* 8.1* 8.7* 8.1* 8.9* 8.0* 7.1* 7.0* 6.9*  -Transfuse 1 units-verbally consented -Transfuse for Hgb <7.0.  Glaucoma/blindness -Continue dorzolamide, brimonidine  Debility: Reportedly bedbound at baseline.  He is from nursing home.  Goals of care counseling: Remains full code -Palliative care consulted.  Class I obesity/malnutrition: Prealbumin 7.9 Body mass index is 30.74 kg/m. Nutrition Problem: Increased nutrient needs Etiology: wound healing Signs/Symptoms: estimated needs Interventions: Refer to RD note for recommendations  DVT prophylaxis:  heparin injection 5,000 Units Start: 03/01/21 2200  Code Status: Full code Family Communication: Updated patient's brother over the phone on 1/6.  No answer today.. Level of care: Progressive Cardiac Status is: Inpatient  Remains inpatient appropriate because: Diabetic wound infection of left foot requiring IV antibiotics and further surgical intervention, PAD with gangrene requiring further surgical intervention.   Final disposition: TBD    Consultants:  Podiatry Vascular surgery Palliative medicine   Sch Meds:  Scheduled Meds:  sodium chloride    Intravenous Once   amLODipine  10 mg Oral Daily   atorvastatin  80 mg Oral QHS   brimonidine  1 drop Both Eyes BID   dorzolamide  1 drop Both Eyes BID   feeding supplement  237 mL Oral BID BM   heparin  5,000 Units Subcutaneous Q8H   insulin aspart  0-5 Units Subcutaneous QHS   insulin aspart  0-9 Units Subcutaneous TID WC   pantoprazole  40 mg Oral Daily   pregabalin  75 mg Oral BID   saccharomyces boulardii  250 mg Oral BID   senna-docusate  1 tablet Oral Daily   sertraline  50 mg Oral QHS   tamsulosin  0.8 mg Oral Daily   Continuous Infusions:  sodium chloride 75 mL/hr at 03/02/21 2315   sodium chloride Stopped (03/03/21 0100)   cefTRIAXone (ROCEPHIN)  IV Stopped (03/01/21 1808)   PRN Meds:.acetaminophen **OR** acetaminophen, bisacodyl, fentaNYL (SUBLIMAZE) injection, hydrALAZINE, LORazepam, ondansetron **OR** ondansetron (ZOFRAN) IV, oxyCODONE, polyethylene glycol  Antimicrobials: Anti-infectives (From admission, onward)    Start     Dose/Rate Route Frequency Ordered Stop   03/02/21 1000  linezolid (ZYVOX) IVPB 600 mg  Status:  Discontinued        600 mg 300 mL/hr over 60 Minutes Intravenous Every 12 hours 03/02/21 0717 03/03/21 1218   03/01/21 1600  cefTRIAXone (ROCEPHIN) 2 g in sodium chloride 0.9 % 100 mL IVPB        2 g 200 mL/hr over 30 Minutes Intravenous Every 24 hours 03/01/21  1507 03/08/21 1559   03/01/21 1600  metroNIDAZOLE (FLAGYL) IVPB 500 mg  Status:  Discontinued        500 mg 100 mL/hr over 60 Minutes Intravenous Every 8 hours 03/01/21 1507 03/03/21 1218   03/01/21 1529  vancomycin variable dose per unstable renal function (pharmacist dosing)  Status:  Discontinued         Does not apply See admin instructions 03/01/21 1529 03/02/21 0717   03/01/21 0015  vancomycin (VANCOREADY) IVPB 2000 mg/400 mL        2,000 mg 200 mL/hr over 120 Minutes Intravenous  Once 03/01/21 0001 03/01/21 0725   03/01/21 0015  clindamycin (CLEOCIN) IVPB 900 mg        900 mg 100  mL/hr over 30 Minutes Intravenous  Once 03/01/21 0010 03/01/21 0104   03/01/21 0015  piperacillin-tazobactam (ZOSYN) IVPB 3.375 g        3.375 g 100 mL/hr over 30 Minutes Intravenous  Once 03/01/21 0012 03/01/21 0216        I have personally reviewed the following labs and images: CBC: Recent Labs  Lab 02/28/21 2350 03/02/21 0242 03/02/21 1151 03/03/21 0818  WBC 12.0* 9.5  --  7.2  NEUTROABS 8.9*  --   --   --   HGB 8.0* 7.1* 7.0* 6.9*  HCT 26.0* 22.8* 22.9* 22.8*  MCV 75.4* 76.0*  --  75.5*  PLT 391 355  --  356   BMP &GFR Recent Labs  Lab 02/28/21 2350 03/02/21 0242 03/03/21 0818  NA 129* 130* 134*  K 4.1 4.4 4.0  CL 101 102 107  CO2 16* 14* 15*  GLUCOSE 210* 182* 176*  BUN 87* 91* 95*  CREATININE 2.33* 2.90* 2.91*  CALCIUM 8.3* 8.2* 8.2*  MG  --   --  2.2  PHOS  --   --  6.3*   Estimated Creatinine Clearance: 30 mL/min (A) (by C-G formula based on SCr of 2.91 mg/dL (H)). Liver & Pancreas: Recent Labs  Lab 02/28/21 2350 03/03/21 0818  AST 11*  --   ALT 14  --   ALKPHOS 107  --   BILITOT 0.9  --   PROT 7.2  --   ALBUMIN 2.1* 2.0*   No results for input(s): LIPASE, AMYLASE in the last 168 hours. No results for input(s): AMMONIA in the last 168 hours. Diabetic: Recent Labs    03/01/21 1552  HGBA1C 8.4*   Recent Labs  Lab 03/02/21 1134 03/02/21 1453 03/02/21 2030 03/03/21 0622 03/03/21 1135  GLUCAP 173* 172* 184* 167* 188*   Cardiac Enzymes: No results for input(s): CKTOTAL, CKMB, CKMBINDEX, TROPONINI in the last 168 hours. No results for input(s): PROBNP in the last 8760 hours. Coagulation Profile: No results for input(s): INR, PROTIME in the last 168 hours. Thyroid Function Tests: No results for input(s): TSH, T4TOTAL, FREET4, T3FREE, THYROIDAB in the last 72 hours. Lipid Profile: No results for input(s): CHOL, HDL, LDLCALC, TRIG, CHOLHDL, LDLDIRECT in the last 72 hours. Anemia Panel: Recent Labs    03/02/21 1151  VITAMINB12 471   FOLATE 7.2  FERRITIN 365*  TIBC 167*  IRON 14*  RETICCTPCT 1.1   Urine analysis:    Component Value Date/Time   COLORURINE YELLOW 12/19/2020 0103   APPEARANCEUR CLOUDY (A) 12/19/2020 0103   APPEARANCEUR Clear 12/31/2013 2116   LABSPEC 1.007 12/19/2020 0103   LABSPEC 1.032 12/31/2013 2116   PHURINE 5.0 12/19/2020 0103   GLUCOSEU >=500 (A) 12/19/2020 0103   GLUCOSEU >=500 12/31/2013  2116   HGBUR SMALL (A) 12/19/2020 0103   BILIRUBINUR NEGATIVE 12/19/2020 0103   BILIRUBINUR Negative 12/31/2013 2116   KETONESUR NEGATIVE 12/19/2020 0103   PROTEINUR 100 (A) 12/19/2020 0103   UROBILINOGEN 1.0 08/28/2013 1709   NITRITE NEGATIVE 12/19/2020 0103   LEUKOCYTESUR LARGE (A) 12/19/2020 0103   LEUKOCYTESUR Negative 12/31/2013 2116   Sepsis Labs: Invalid input(s): PROCALCITONIN, [place]  Microbiology: Recent Results (from the past 240 hour(s))  Resp Panel by RT-PCR (Flu A&B, Covid) Nasopharyngeal Swab     Status: None   Collection Time: 03/01/21  8:20 AM   Specimen: Nasopharyngeal Swab; Nasopharyngeal(NP) swabs in vial transport medium  Result Value Ref Range Status   SARS Coronavirus 2 by RT PCR NEGATIVE NEGATIVE Final    Comment: (NOTE) SARS-CoV-2 target nucleic acids are NOT DETECTED.  The SARS-CoV-2 RNA is generally detectable in upper respiratory specimens during the acute phase of infection. The lowest concentration of SARS-CoV-2 viral copies this assay can detect is 138 copies/mL. A negative result does not preclude SARS-Cov-2 infection and should not be used as the sole basis for treatment or other patient management decisions. A negative result may occur with  improper specimen collection/handling, submission of specimen other than nasopharyngeal swab, presence of viral mutation(s) within the areas targeted by this assay, and inadequate number of viral copies(<138 copies/mL). A negative result must be combined with clinical observations, patient history, and  epidemiological information. The expected result is Negative.  Fact Sheet for Patients:  EntrepreneurPulse.com.au  Fact Sheet for Healthcare Providers:  IncredibleEmployment.be  This test is no t yet approved or cleared by the [place] FDA and  has been authorized for detection and/or diagnosis of SARS-CoV-2 by FDA under an Emergency Use Authorization (EUA). This EUA will remain  in effect (meaning this test can be used) for the duration of the COVID-19 declaration under Section 564(b)(1) of the Act, 21 U.S.C.section 360bbb-3(b)(1), unless the authorization is terminated  or revoked sooner.       Influenza A by PCR NEGATIVE NEGATIVE Final   Influenza B by PCR NEGATIVE NEGATIVE Final    Comment: (NOTE) The Xpert Xpress SARS-CoV-2/FLU/RSV plus assay is intended as an aid in the diagnosis of influenza from Nasopharyngeal swab specimens and should not be used as a sole basis for treatment. Nasal washings and aspirates are unacceptable for Xpert Xpress SARS-CoV-2/FLU/RSV testing.  Fact Sheet for Patients: EntrepreneurPulse.com.au  Fact Sheet for Healthcare Providers: IncredibleEmployment.be  This test is not yet approved or cleared by the [place] FDA and has been authorized for detection and/or diagnosis of SARS-CoV-2 by FDA under an Emergency Use Authorization (EUA). This EUA will remain in effect (meaning this test can be used) for the duration of the COVID-19 declaration under Section 564(b)(1) of the Act, 21 U.S.C. section 360bbb-3(b)(1), unless the authorization is terminated or revoked.  Performed at [facility], [place] [address], [place], [place] [address]   Aerobic/Anaerobic Culture w Gram Stain (surgical/deep wound)     Status: None (Preliminary result)   Collection Time: 03/01/21  1:05 PM   Specimen: Foot, Left; Wound  Result Value Ref Range Status   Specimen Description WOUND   Final   Special Requests LEFT FOOT SPEC A  Final   Gram Stain   Final    NO WBC SEEN Performed at [facility], [address], [place], [place] [id number]    Culture FEW PROTEUS MIRABILIS  Final   Report Status PENDING  Incomplete   Organism  ID, Bacteria PROTEUS MIRABILIS  Final      Susceptibility   Proteus mirabilis - MIC*    AMPICILLIN >=32 RESISTANT Resistant     CEFAZOLIN >=64 RESISTANT Resistant     CEFEPIME <=0.12 SENSITIVE Sensitive     CEFTAZIDIME 2 SENSITIVE Sensitive     CEFTRIAXONE 1 SENSITIVE Sensitive     CIPROFLOXACIN <=0.25 SENSITIVE Sensitive     GENTAMICIN <=1 SENSITIVE Sensitive     IMIPENEM 2 SENSITIVE Sensitive     TRIMETH/SULFA <=20 SENSITIVE Sensitive     AMPICILLIN/SULBACTAM >=32 RESISTANT Resistant     PIP/TAZO <=4 SENSITIVE Sensitive     * FEW PROTEUS MIRABILIS  Blood Cultures x 2 sites     Status: None (Preliminary result)   Collection Time: 03/01/21  3:45 PM   Specimen: BLOOD LEFT HAND  Result Value Ref Range Status   Specimen Description BLOOD LEFT HAND  Final   Special Requests   Final    BOTTLES DRAWN AEROBIC AND ANAEROBIC Blood Culture adequate volume   Culture   Final    NO GROWTH 2 DAYS Performed at [facility], [address], [place], [place] [address]    Report Status PENDING  Incomplete  Blood Cultures x 2 sites     Status: None (Preliminary result)   Collection Time: 03/01/21  3:53 PM   Specimen: BLOOD RIGHT ARM  Result Value Ref Range Status   Specimen Description BLOOD RIGHT ARM  Final   Special Requests   Final    BOTTLES DRAWN AEROBIC AND ANAEROBIC Blood Culture results may not be optimal due to an excessive volume of blood received in culture bottles   Culture   Final    NO GROWTH 2 DAYS Performed at [facility], [place] [address] [place], [place] [address]    Report Status PENDING  Incomplete    Radiology Studies: PERIPHERAL VASCULAR CATHETERIZATION  Result Date: 03/02/2021 DATE OF SERVICE: 03/02/2021   PATIENT:  [name]  61 y.o. male  PRE-OPERATIVE DIAGNOSIS:  Atherosclerosis of native arteries of left lower extremity; necrotizing left foot infection s/p TMA  POST-OPERATIVE DIAGNOSIS:  Same  PROCEDURE:  1) US guided right common femoral artery access 2) Aortogram 3) Left lower extremity angiogram with third order cannulation (33m total contrast) 4) Conscious sedation (35 minutes)  SURGEON:  TYevonne Aline HStanford Breed MD  ASSISTANT: none  ANESTHESIA:   local and IV sedation  ESTIMATED BLOOD LOSS: minimal  LOCAL MEDICATIONS USED:  LIDOCAINE  COUNTS: confirmed correct.  PATIENT DISPOSITION:  PACU - hemodynamically stable.  Delay start of Pharmacological VTE agent (>24hrs) due to surgical blood loss or risk of bleeding: no  INDICATION FOR PROCEDURE: JCRYSTIAN FRITHis a 61y.o. male with severe peripheral arterial disease by noninvasive testing.  [facility] with a necrotizing soft tissue infection of his foot.  He was taken urgently to the operating room by the podiatry team for transmetatarsal amputation. After careful discussion of risks, benefits, and alternatives the patient was offered angiography. The patient  understood and wished to proceed.  OPERATIVE FINDINGS: Terminal aorta and iliac arteries: Widely patent without flow-limiting stenosis  Left lower extremity: Common femoral artery: Widely patent without flow-limiting stenosis Profunda femoris artery: Widely patent without flow-limiting stenosis Superficial femoral artery: Widely patent without flow-limiting stenosis Popliteal artery: Widely patent without flow-limiting stenosis Anterior tibial artery: Patent at its origin, occludes in the proximal calf Tibioperoneal trunk: patent Peroneal artery: Patent at its  origin, occludes in the proximal calf; the small island reconstitutes in the distal calf, but then occludes again. Posterior tibial artery: Patent at its origin, occludes in the proximal calf Pedal circulation: Virtually no flow  seen angiographically  DESCRIPTION OF PROCEDURE: After identification of the patient in the pre-operative holding area, the patient was transferred to the operating room. The patient was positioned supine on the operating room table.  Anesthesia was induced. The groins was prepped and draped in standard fashion. A surgical pause was performed confirming correct patient, procedure, and operative location.  The right groin was anesthetized with subcutaneous injection of 1% lidocaine. Using ultrasound guidance, the right common femoral artery was accessed with micropuncture technique. Fluoroscopy was used to confirm cannulation over the femoral head. The 61F sheath was upsized to 23F.  A Benson wire was advanced into the distal aorta. Over the wire an omni flush catheter was advanced to the level of L2. Aortogram was performed - see above for details.  The left common iliac artery was selected with a Glidewire guidewire. The wire was advanced into the common femoral artery. Over the wire the omni flush catheter was advanced into the external iliac artery. Selective angiography was performed - see above for details.  The Glidewire was then navigated into the popliteal artery and a straight flush catheter advanced over the wire into the popliteal artery to improve visualization of the tibial vessels and allow dilute contrast to be used to best visualize the problem area.  See above for findings.  The sheath was left in place to be removed in the recovery area.  Conscious sedation was administered with the use of IV fentanyl and midazolam under continuous physician and nurse monitoring.  Heart rate, blood pressure, and oxygen saturation were continuously monitored.  Total sedation time was 35 minutes  Upon completion of the case instrument and sharps counts were confirmed correct. The patient was transferred to the PACU in good condition. I was present for all portions of the procedure.  PLAN: No options for  revascularization.  Needs left below-knee amputation.  Will discuss with family.  [name]. Stanford Breed, MD [facility] Phone Number: [phone] 03/02/2021 2:37 PM      Gladstone Rosas T. [place]  If 7PM-7AM, please contact night-coverage [email or web address] 03/03/2021, 12:20 PM

## 2021-03-03 NOTE — Progress Notes (Signed)
Subjective: POD #2 s/p LEFT TMA.  Some mild discomfort to the left foot but he has no other concerns today.  Denies any fevers or chills.   Objective: NAD Status post left transmetatarsal amputation with drain intact with well sutures. Hyperpigmented changes present just proximal to the ankle to the foot.  No significant pain on exam. Nails in the right foot are significant hypertrophic and dystrophic. No pain with calf compression, swelling, warmth, erythema         Assessment: POD #2 s/p LEFT TMA  Plan: Recently went angio yesterday no options for revascularization recommended below-knee amputation of the left side.  Palliative care has also been consulted.  At this point will defer to vascular surgery for further surgical plans.  Hemoglobin down to 6.9 and 1 unit has been ordered.   Celesta Gentile, DPM

## 2021-03-03 NOTE — Progress Notes (Addendum)
Patient c/o headache 5/10 within 5 minutes of blood transfusion inittiated. Transfusion paused and md notified.VS remain the same. No other symptoms reported. Dr. Cyndia Skeeters notified. Md ordered to give tylenol PO and continue transfusion Will continue to monitor.

## 2021-03-03 NOTE — Evaluation (Signed)
Physical Therapy Evaluation Patient Details Name: [name] MRN: [id number] DOB: 1960-05-01 Today's Date: 03/03/2021  History of Present Illness  The pt is a 61 yo male presenting 1/4 from [place] due to necrotic L foot wound. S/p L transmet amputation 1/5 and angiogram 1/6 with no options for revascularization. Vascular team recommends BKA. 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   Clinical Impression  Pt in bed upon arrival of PT, agreeable to evaluation at this time. Prior to admission the pt was completely dependent on staff to complete all rolling and repositioning in the bed, and staff used a hoyer lift to move the pt OOB for any transfers. The pt reports that he is not able to complete any mobility or self-care for himself other than feeding, and therefore is likely at his baseline mobility at this time. He tolerated more PROM to L UE and LLE, but reported pain with passive movement of R-sided extremities at this time. Required totalA to attempt to reposition or pull to sitting in bed. No further acute PT needs, will be safe to return to facility with no follow up therapies when medically cleared.        Recommendations for follow up therapy are one component of a multi-disciplinary discharge planning process, led by the attending physician.  Recommendations may be updated based on patient status, additional functional criteria and insurance authorization.  Follow Up Recommendations Long-term institutional care without follow-up therapy    Assistance Recommended at Discharge Frequent or constant Supervision/Assistance  Patient can return home with the following   (pt returning to SNF with totalA)    Equipment Recommendations None recommended by PT  Recommendations for Other Services       Functional Status Assessment Patient  has not had a recent decline in their functional status     Precautions / Restrictions Precautions Precautions: Other (comment) Precaution Comments: pt bedbound with use of lift at baseline Restrictions Weight Bearing Restrictions: Yes LLE Weight Bearing: Non weight bearing      Mobility  Bed Mobility Overal bed mobility: Needs Assistance Bed Mobility: Rolling;Supine to Sit Rolling: Total assist   Supine to sit: Total assist     General bed mobility comments: pt unable to initiate any movement with trunk in bed without totalA from therapist. unable to rotate to assist with rolling or pull forward away from [name] support. pt reports assist +2 at SNF to roll    Transfers                   General transfer comment: pt uses hoyer at baseline           Pertinent Vitals/Pain Pain Assessment: Faces Faces Pain Scale: Hurts even more Pain Location: R knee with PROM, L foot/leg Pain Descriptors / Indicators: Discomfort;Grimacing Pain Intervention(s): Limited activity within patient's tolerance;Monitored during session;Repositioned    Home Living Family/patient expects to be discharged to:: Skilled nursing facility                   Additional Comments: resides at [place]    Prior Function Prior Level of Function : Needs assist       Physical Assist : Mobility (physical);ADLs (physical) Mobility (physical): Bed mobility;Transfers ADLs (physical): Grooming;Bathing;Toileting;Dressing Mobility Comments: pt reports staff assists with any mobility, uses hoyer lift to get out of bed ADLs Comments: pt reports he  is able to feed himself, but is otherwise dependent on staff for any ADLs     Hand Dominance   Dominant Hand: Right (uses Left after stroke affecting R side)    Extremity/Trunk Assessment   Upper Extremity Assessment Upper Extremity Assessment: Generalized weakness;RUE deficits/detail RUE Deficits / Details: impaired from CVA, sensitive to  touch. trace movement when ased to grip, no active extension at fingers or any other active ROM at elbow or shoulder RUE Sensation: decreased light touch RUE Coordination: decreased fine motor;decreased gross motor    Lower Extremity Assessment Lower Extremity Assessment: Generalized weakness;RLE deficits/detail;LLE deficits/detail RLE Deficits / Details: no active movement to command, limited ROM at ankle, painful with any ROM at knee. unable to accurately sense touch RLE Sensation: decreased light touch;decreased proprioception RLE Coordination: decreased fine motor;decreased gross motor LLE Deficits / Details: new transmet amp. no active movement against gravity, but able to tolerate PROM at knee and hip LLE Sensation: decreased light touch    Cervical / Trunk Assessment Cervical / Trunk Assessment: Kyphotic  Communication   Communication: No difficulties  Cognition Arousal/Alertness: Lethargic Behavior During Therapy: Flat affect Overall Cognitive Status: No family/caregiver present to determine baseline cognitive functioning                                 General Comments: pt with flat affect but answering simple questions with increased time. pt with some inconsistencies such as stating he has normal sensation in LE but also unable to verbalize where I was touchign his LE        General Comments General comments (skin integrity, edema, etc.): BP stable despite low Hgb (6.9). 107/69 (81) with HOB at 62 deg in chair position. Pt remained lethargic but answering simple questions.    Exercises Other Exercises Other Exercises: PROM to BLE with pt able to tolerate greater ROM and mroe reps on LLE. Other Exercises: PROM/AAROM to UE. pt only able to assist with LUE   Assessment/Plan    PT Assessment Patient does not need any further PT services         PT Goals (Current goals can be found in the Care Plan section)  Acute Rehab PT Goals Patient Stated Goal:  reduce pain PT Goal Formulation: All assessment and education complete, DC therapy Time For Goal Achievement: 03/17/21 Potential to Achieve Goals: Fair     AM-PAC PT "6 Clicks" Mobility  Outcome Measure Help needed turning from your back to your side while in a flat bed without using bedrails?: Total Help needed moving from lying on your back to sitting on the side of a flat bed without using bedrails?: Total Help needed moving to and from a bed to a chair (including a wheelchair)?: Total Help needed standing up from a chair using your arms (e.g., wheelchair or bedside chair)?: Total Help needed to walk in hospital room?: Total Help needed climbing 3-5 steps with a railing? : Total 6 Click Score: 6    End of Session   Activity Tolerance: Patient tolerated treatment well Patient left: in bed;with call bell/phone within reach Nurse Communication: Mobility status PT Visit Diagnosis: Other abnormalities of gait and mobility (R26.89)    Time: 2542-7062 PT Time Calculation (min) (ACUTE ONLY): 14 min   Charges:   PT Evaluation $PT Eval Low Complexity: 1 Low          West Carbo, PT, DPT   Acute Rehabilitation Department Pager #: (  [phone]  Sandra Cockayne 03/03/2021, 11:13 AM

## 2021-03-03 NOTE — Progress Notes (Addendum)
°  Progress Note    03/03/2021 9:10 AM 1 Day Post-Op  Subjective:  No complaints   Vitals:   03/03/21 0830 03/03/21 0852  BP: 114/73   Pulse: 84 82  Resp: 15 11  Temp: (!) 97.5 F (36.4 C)   SpO2: 99% 100%   Physical Exam: Lungs:  non labored Incisions:  L groin cath site without hematoma Extremities:  R foot dressing left in place Neurologic: A&O  CBC    Component Value Date/Time   WBC 7.2 03/03/2021 0818   RBC 3.02 (L) 03/03/2021 0818   HGB 6.9 (LL) 03/03/2021 0818   HGB 13.9 12/31/2013 1336   HCT 22.8 (L) 03/03/2021 0818   HCT 43.0 12/31/2013 1336   PLT 356 03/03/2021 0818   PLT 184 12/31/2013 1336   MCV 75.5 (L) 03/03/2021 0818   MCV 81 12/31/2013 1336   MCH 22.8 (L) 03/03/2021 0818   MCHC 30.3 03/03/2021 0818   RDW 14.8 03/03/2021 0818   RDW 14.4 12/31/2013 1336   LYMPHSABS 1.8 02/28/2021 2350   MONOABS 1.0 02/28/2021 2350   EOSABS 0.1 02/28/2021 2350   BASOSABS 0.0 02/28/2021 2350    BMET    Component Value Date/Time   NA 134 (L) 03/03/2021 0818   NA 135 (L) 12/31/2013 1336   K 4.0 03/03/2021 0818   K 3.8 12/31/2013 1336   CL 107 03/03/2021 0818   CL 101 12/31/2013 1336   CO2 15 (L) 03/03/2021 0818   CO2 25 12/31/2013 1336   GLUCOSE 176 (H) 03/03/2021 0818   GLUCOSE 249 (H) 12/31/2013 1336   BUN 95 (H) 03/03/2021 0818   BUN 8 12/31/2013 1336   CREATININE 2.91 (H) 03/03/2021 0818   CREATININE 0.88 12/31/2013 1336   CALCIUM 8.2 (L) 03/03/2021 0818   CALCIUM 8.6 12/31/2013 1336   GFRNONAA 24 (L) 03/03/2021 0818   GFRNONAA >60 12/31/2013 1336   GFRAA >60 01/29/2018 1705   GFRAA >60 12/31/2013 1336    INR No results found for: INR   Intake/Output Summary (Last 24 hours) at 03/03/2021 0910 Last data filed at 03/03/2021 0900 Gross per 24 hour  Intake 380 ml  Output 460 ml  Net -80 ml     Assessment/Plan:  61 y.o. [name] is s/p R leg angiography 1 Day Post-Op   L groin cath site without hematoma No options for revascularization of RLE  based on angiography Patient will require a R BKA next week   Dagoberto Ligas, PA-C [facility] [phone] 03/03/2021 9:10 AM  VASCULAR STAFF ADDENDUM: I have independently interviewed and examined the patient. I agree with the above.    Cassandria Santee, MD [facility] Phone Number: [phone] 03/03/2021 10:47 AM

## 2021-03-03 NOTE — Consult Note (Addendum)
Palliative Medicine Inpatient Consult Note  Consulting Provider: Mercy Riding, MD  Reason for consult:    Answer  [place] Palliative Medicine Consult  Reason for Consult? goal of care discussion   HPI:  Per intake H&P --> [name] is a 61 y.o. male with medical history significant of bipolar d/o; DM; HTN; and chronic diastolic CHF presenting with a foot infection.  He was unaware of an issue with his feet and denies pain but also denies sensation loss.  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    The Palliative care team saw [name] one year ago in the setting of volume overload. At that time he was made a DNR as per conversations with he, his brother, and father. It appears that he is now Full Code.   Palliative care has been consulted to further discuss goals of care and code status in the setting of having severe PAD requiring a left BKA.   Clinical Assessment/Goals of Care:  *Please note that this is a verbal dictation therefore any spelling or grammatical errors are due to the "[employer]rad One" system interpretation.  I have reviewed medical records including EPIC notes, labs and imaging, received report from bedside RN, assessed the patient who is lying in bed in NAD. [name] is aware of where we are though is not aware of the date time, or complexity of his health situation. He presently appears to have poor insight and judgement.    I called patients brother, [name]  to further discuss diagnosis prognosis, GOC, EOL wishes, disposition and options.   I introduced Palliative Medicine as specialized medical care for people living with serious illness. It focuses on providing relief from the symptoms and stress of a serious illness. The goal is to improve quality of life for both the patient and the family.  Medical History Review and Understanding: Patients brother shares with me that [name] has been in and out of various facilities over the  past year. He states that for a while his health status seemed to improve though when he transitioned to [place] he deteriorated.   We reviewed [name] multiple chronic disease processes inclusive of his diabetes (very poorly controlled), heart failure, peripheral arterial disease, visual impairment from glaucoma, and hypertension. We reviewed that he has consistently been in and out of the hospital.   [name] discussed the reality that his brother is not improving and that he realizes this. He knows that a decision needs to be make regarding a right BKA. I asked if he felt his brother would want a limb removed which he was not sure of. He states that he is willing to meet in person for further conversation.   Social History: [name] states to me that he lives in [place].He is presently living with his brother, [name] and his father, [name]. 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 He is married - he and his wife, [name] have been married for nineteen years though are separated. He is not overtly religious though has practiced within the [place].    Functional and Nutritional State: From a functional standpoint [name] had been wheelchair dependent though within the last three months has become bed-bound while living at [facility]. He requires full assistance for all bADLs.  Advance Directives: There are not advance directives on file.   Code Status: Concepts specific to code status, artifical feeding and hydration, continued IV antibiotics and rehospitalization was  had.  Reviewed the trauma that can be endured in the setting of cardiopulmonary resuscitation and that this intervention although possible keeping someone alive will not enhance their quality of life or improve their underlying chronic disease processes.  Discussed the importance of continued conversation with family and their  medical providers regarding overall plan of care and treatment options, ensuring  decisions are within the context of the patients values and GOCs.  _______________________________ Addendum:  Patients RN, [name] shared that brother came to bedside. When I went to meet with him he had already left.  I called [name] to discuss with him [name]'s poor health state. We reviewed again his acute on chronic disease processes. [name] \\shares  with me that he plans on continuing all interventions for [name] to prolong life.  I asked [name] if he truly understood the gravity of [name]'s illess(s) which he shares he does not but know per prior conversations that his brother "wants to live". I asked if they had ever reviewed what quality of life would be acceptable to [name] which he shares that had not. I reviewed the cycle of illness and re-hospitalizations that [name] has had. I shared these are likely to continue despite any and all interventions as he slowly failing. He said "hmm". He though about this for a period of time. [name] shares that he now wants to continue with present agressive measures.   Decision Maker: [name] (Brother) [phone]  SUMMARY OF RECOMMENDATIONS   Full Code -> Have explained the reality of CPR in [name] to his brother and what the long term would look like. He plans to think more about this.  Plan to meet with patients brother, [name] to review code status and patients overall health state. [name] cannot commit to a time though states he will be in later in the day.  Patient quite declined since he was seen one year ago, no longer able to propel in a wheelchair. Is now bed-bound. Long term prognosis is very worrisome considering his declined state  Ongoing incremental Palliative support  Code Status/Advance Care Planning: FULL CODE   Palliative Prophylaxis:  Aspiration, Bowel Regimen, Delirium Protocol, Frequent Pain Assessment, Oral Care, Palliative Wound Care, and Turn Reposition  Additional Recommendations (Limitations, Scope, Preferences): Continue  current scope of care for the time being.  Psycho-social/Spiritual:  Desire for further Chaplaincy support: No Additional Recommendations: Education on peripheral arterial disease.    Prognosis: Exceptionally poor, patient is appropriate for hospice considerations given his bed-bound state, lack of PO's, hypoalbuminemia, and overall multi-system failure.   Discharge Planning: Discharge to [facility] when medically optimized.  Vitals:   03/02/21 2339 03/03/21 0320  BP: 115/69 109/69  Pulse: 85 85  Resp: 17 17  Temp: (!) 97.2 F (36.2 C) 97.8 F (36.6 C)  SpO2: 100% 100%    Intake/Output Summary (Last 24 hours) at 03/03/2021 0658 Last data filed at 03/03/2021 0600 Gross per 24 hour  Intake 140 ml  Output 460 ml  Net -320 ml   Last Weight  Most recent update: 03/03/2021  3:21 AM    Weight  91.7 kg (202 lb 2.6 oz)            Gen:  AA M in NAD HEENT: Dry mucous membranes, very poor oral hygiene CV: Regular rate and rhythm  PULM: clear to auscultation bilaterally  ABD: Distended, high pitch bowel sounds, tender to deep palpation EXT: No edema  Neuro: Alert and oriented x2  PPS: 30%   This conversation/these  recommendations were discussed with patient primary care team, Dr. Cyndia Skeeters  Time In: 1100 Time Out: 1217 Total Time: 77  MDM High in the setting of complex decision making for additional surgery (BKA). Escalation or de-escalation of care. ______________________________________________________ [place] Team Team Cell Phone: [phone] Please utilize secure chat with additional questions, if there is no response within 30 minutes please call the above phone number  Palliative Medicine Team providers are available by phone from 7am to 7pm daily and can be reached through the team cell phone.  Should this patient require assistance outside of these hours, please call the patient's attending physician.

## 2021-03-04 DIAGNOSIS — N179 Acute kidney failure, unspecified: Secondary | ICD-10-CM | POA: Diagnosis not present

## 2021-03-04 DIAGNOSIS — Z7189 Other specified counseling: Secondary | ICD-10-CM | POA: Diagnosis not present

## 2021-03-04 DIAGNOSIS — E11628 Type 2 diabetes mellitus with other skin complications: Secondary | ICD-10-CM | POA: Diagnosis not present

## 2021-03-04 DIAGNOSIS — I5032 Chronic diastolic (congestive) heart failure: Secondary | ICD-10-CM | POA: Diagnosis not present

## 2021-03-04 LAB — GLUCOSE, CAPILLARY
Glucose-Capillary: 141 mg/dL — ABNORMAL HIGH (ref 70–99)
Glucose-Capillary: 142 mg/dL — ABNORMAL HIGH (ref 70–99)
Glucose-Capillary: 145 mg/dL — ABNORMAL HIGH (ref 70–99)
Glucose-Capillary: 135 mg/dL — ABNORMAL HIGH (ref 70–99)
Glucose-Capillary: 163 mg/dL — ABNORMAL HIGH (ref 70–99)

## 2021-03-04 LAB — CBC
HCT: 27.5 % — ABNORMAL LOW (ref 39.0–52.0)
Hemoglobin: 8.8 g/dL — ABNORMAL LOW (ref 13.0–17.0)
MCH: 24.3 pg — ABNORMAL LOW (ref 26.0–34.0)
MCHC: 32 g/dL (ref 30.0–36.0)
MCV: 76 fL — ABNORMAL LOW (ref 80.0–100.0)
Platelets: 356 10*3/uL (ref 150–400)
RBC: 3.62 MIL/uL — ABNORMAL LOW (ref 4.22–5.81)
RDW: 15.2 % (ref 11.5–15.5)
WBC: 7.8 10*3/uL (ref 4.0–10.5)
nRBC: 0 % (ref 0.0–0.2)

## 2021-03-04 LAB — RENAL FUNCTION PANEL
Albumin: 2 g/dL — ABNORMAL LOW (ref 3.5–5.0)
Anion gap: 13 (ref 5–15)
BUN: 94 mg/dL — ABNORMAL HIGH (ref 6–20)
CO2: 14 mmol/L — ABNORMAL LOW (ref 22–32)
Calcium: 8 mg/dL — ABNORMAL LOW (ref 8.9–10.3)
Chloride: 105 mmol/L (ref 98–111)
Creatinine, Ser: 2.85 mg/dL — ABNORMAL HIGH (ref 0.61–1.24)
GFR, Estimated: 25 mL/min — ABNORMAL LOW (ref >60)
Glucose, Bld: 145 mg/dL — ABNORMAL HIGH (ref 70–99)
Phosphorus: 6.1 mg/dL — ABNORMAL HIGH (ref 2.5–4.6)
Potassium: 3.9 mmol/L (ref 3.5–5.1)
Sodium: 132 mmol/L — ABNORMAL LOW (ref 135–145)

## 2021-03-04 LAB — MAGNESIUM: Magnesium: 2 mg/dL (ref 1.7–2.4)

## 2021-03-04 MED ORDER — STERILE WATER FOR INJECTION IV SOLN
INTRAVENOUS | Status: DC
  Filled 2021-03-04 (×11): qty 1000

## 2021-03-04 NOTE — Progress Notes (Signed)
PROGRESS NOTE  [name] OEV:[id number] DOB: 1961/01/10   PCP: Jodi Marble, MD  Patient is from: Nursing home.  Legally blind. Bedbound at baseline.  DOA: 02/28/2021 LOS: 3  Chief complaints:  Chief Complaint  Patient presents with   Wound Check     Brief Narrative / Interim history: 61 year old M with PMH of DM-2, diastolic CHF, WEX-9B, glaucoma, HTN, depression, bipolar disorder, blindness and debility/bedbound at baseline brought to ED with diabetic left foot wound infection.  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  Started on IV vancomycin, ceftriaxone and Flagyl.  Podiatry and vascular surgery consulted.  Patient underwent left TMA amputation by Dr. Jacqualyn Posey on 03/01/2021.  ABI with noncompressible RLE arteries and moderate LLE PAD.  Underwent LLE angiography with no visible pedal circulation to revascularize.  Vascular surgery recommended BKA, likely next week.  Palliative medicine consulted  Subjective: Seen and examined earlier this morning.  No major events overnight of this morning.  He says he is hungry.  He did not like to eat hospital food.  No other complaints.  Objective: Vitals:   03/04/21 0343 03/04/21 0500 03/04/21 0824 03/04/21 1126  BP: 115/74  119/85 127/88  Pulse: 80  84 85  Resp: _0 Temp: 98.5 F (36.9 C)  97.7 F (36.5 C) (!) 97.5 F (36.4 C)  TempSrc: Oral  Oral Axillary  SpO2: 99%  100% 100%  Weight:  97.8 kg    Height:        Examination:  GENERAL: No apparent distress.  Nontoxic. HEENT: MMM.  Vision and hearing grossly intact.  NECK: Supple.  No apparent JVD.  RESP: 100% on RA.  No IWOB.  Fair aeration bilaterally. CVS:  RRR. Heart sounds normal.  ABD/GI/GU: BS+. Abd soft, NTND.  MSK/EXT:  Moves extremities.  Significant muscle mass and  subcu fat loss. SKIN: Dressing over LLE DCI.  Drain in place.  Hypertrophic toenails.  See picture under media for more NEURO: Awake and alert. Oriented fairly.  No apparent focal neuro deficit. PSYCH: Calm.  Flat affect.  Procedures:  03/01/2021-left transmetatarsal amputation by Dr. Jacqualyn Posey 03/02/2021-LLE angiogram with no visible pedal circulation  Microbiology summarized: COVID-19 and influenza PCR nonreactive. Blood cultures NGTD. Surgical tissue culture Proteus mirabilis.  Assessment & Plan: Diabetic wound infection of left foot  Osteomyelitis of left foot -CRP and ESR elevated. 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  Per OR note, I do not think full source control has been achieved yet -Discontinue Zyvox and Flagyl -Continue IV ceftriaxone -Appreciate input by dietitian -Manage PAD/gangrene as below.  Lower extremity PAD with gangrene -ABI with noncompressible arteries in RLE and mod PAD in LLE.   -LE angiogram with no visible pedal circulation to revascularize. -Vascular surgery recommended left BKA-likely next week. -Continue statin and aspirin  AKI/azotemia on CKD-3A: POA. Cr seems to have plateaued. Metabolic acidosis-likely due to renal failure. Recent Labs    12/05/20 0832 12/07/20 0254 12/08/20 0743 12/09/20 0734 12/11/20 0531 12/19/20 0006 02/28/21 2350 03/02/21 0242 03/03/21 0818 03/04/21 0730  BUN 17 21* 24* 26* 33* 32* 87* 91* 95* 94*  CREATININE 1.42* 1.51* 1.08 1.18 1.37* 1.25* 2.33* 2.90* 2.91* 2.85*  -Change IV fluids to sodium bicarb -Monitor urine output and renal function closely   Chronic diastolic CHF: Stable.  Appears euvolemic on exam.  Slightly hypotensive. -Monitor fluid and respiratory status while on IV fluid -Continue holding Demadex and Entresto given AKI -Discontinued BiDil due to hypotension.   Uncontrolled DM-2 with hyperglycemia, PAD and neuropathy: A1c 8.4%. Recent Labs  Lab 03/03/21 0622 03/03/21 1135 03/03/21 1809  03/03/21 2028 03/04/21 0609  GLUCAP 167* 188* 181* 156* 141*  -Continue SSI-sensitive -Continue basal insulin 3 units daily -Continue statin   History of depression and bipolar disorder: Stable. -Continue Zoloft   Hypotension/history of essential hypertension: Resolved. -Continue IV fluid  -Decrease amlodipine to 5 mg daily -Continue holding Entresto, Demadex and BiDil.   Hyponatremia: Likely from AKI and CKD. -Continue monitoring  Anemia of renal disease: Hgb down to 7.1 this morning.  May be some surgical blood loss and hemodilution.  Transfused 1 unit with appropriate response. Recent Labs    12/07/20 0254 12/08/20 0743 12/09/20 0734 12/19/20 0006 02/28/21 2350 03/02/21 0242 03/02/21 1151 03/03/21 0818 03/03/21 2021 03/04/21 0730  HGB 8.1* 8.7* 8.1* 8.9* 8.0* 7.1* 7.0* 6.9* 8.2* 8.8*  -Transfuse for Hgb <7.0.  Glaucoma/blindness -Continue dorzolamide, brimonidine  Debility: Reportedly bedbound at baseline.  He is from nursing home.  Goals of care counseling: Remains full code -Appreciate input by palliative medicine  Class I obesity/malnutrition: Prealbumin 7.9 Body mass index is 32.78 kg/m. Nutrition Problem: Increased nutrient needs Etiology: wound healing Signs/Symptoms: estimated needs Interventions: Refer to RD note for recommendations  DVT prophylaxis:  heparin injection 5,000 Units Start: 03/01/21 2200  Code Status: Full code Family Communication: Updated patient's brother over the phone on 1/7. Level of care: Progressive Cardiac.  Change to MedSurg Status is: Inpatient  Remains inpatient appropriate because: Diabetic wound infection of left foot requiring IV antibiotics and further surgical intervention, PAD with gangrene requiring further surgical intervention.   Final disposition: TBD    Consultants:  Podiatry Vascular surgery Palliative medicine   Sch Meds:  Scheduled Meds:  sodium chloride   Intravenous Once   amLODipine  5 mg  Oral Daily   atorvastatin  80 mg Oral QHS   brimonidine  1 drop Both Eyes BID   dorzolamide  1 drop Both Eyes BID   feeding supplement  237 mL Oral BID BM   heparin  5,000 Units Subcutaneous Q8H   insulin aspart  0-5 Units Subcutaneous QHS   insulin aspart  0-9 Units Subcutaneous TID WC   insulin glargine-yfgn  3 Units Subcutaneous Daily   pantoprazole  40 mg Oral Daily   pregabalin  75 mg Oral BID   saccharomyces boulardii  250 mg Oral BID   senna-docusate  1 tablet Oral Daily   sertraline  50 mg Oral QHS   tamsulosin  0.8 mg Oral Daily   Continuous Infusions:  sodium chloride Stopped (03/03/21 0100)   cefTRIAXone (ROCEPHIN)  IV 200 mL/hr at 03/03/21 2323    sodium bicarbonate (isotonic) infusion in sterile water 125 mL/hr at 03/04/21 1205   PRN Meds:.acetaminophen **OR** acetaminophen, bisacodyl, fentaNYL (SUBLIMAZE) injection, hydrALAZINE, LORazepam, ondansetron **OR** ondansetron (ZOFRAN) IV, oxyCODONE, polyethylene glycol  Antimicrobials: Anti-infectives (From admission, onward)    Start     Dose/Rate Route Frequency Ordered Stop   03/02/21 1000  linezolid (ZYVOX) IVPB 600 mg  Status:  Discontinued        600 mg 300 mL/hr over 60 Minutes Intravenous Every 12 hours 03/02/21 0717 03/03/21 1218   03/01/21 1600  cefTRIAXone (ROCEPHIN) 2 g in sodium chloride 0.9 % 100 mL IVPB  2 g 200 mL/hr over 30 Minutes Intravenous Every 24 hours 03/01/21 1507 03/07/21 2159   03/01/21 1600  metroNIDAZOLE (FLAGYL) IVPB 500 mg  Status:  Discontinued        500 mg 100 mL/hr over 60 Minutes Intravenous Every 8 hours 03/01/21 1507 03/03/21 1218   03/01/21 1529  vancomycin variable dose per unstable renal function (pharmacist dosing)  Status:  Discontinued         Does not apply See admin instructions 03/01/21 1529 03/02/21 0717   03/01/21 0015  vancomycin (VANCOREADY) IVPB 2000 mg/400 mL        2,000 mg 200 mL/hr over 120 Minutes Intravenous  Once 03/01/21 0001 03/01/21 0725   03/01/21  0015  clindamycin (CLEOCIN) IVPB 900 mg        900 mg 100 mL/hr over 30 Minutes Intravenous  Once 03/01/21 0010 03/01/21 0104   03/01/21 0015  piperacillin-tazobactam (ZOSYN) IVPB 3.375 g        3.375 g 100 mL/hr over 30 Minutes Intravenous  Once 03/01/21 0012 03/01/21 0216        I have personally reviewed the following labs and images: CBC: Recent Labs  Lab 02/28/21 2350 03/02/21 0242 03/02/21 1151 03/03/21 0818 03/03/21 2021 03/04/21 0730  WBC 12.0* 9.5  --  7.2  --  7.8  NEUTROABS 8.9*  --   --   --   --   --   HGB 8.0* 7.1* 7.0* 6.9* 8.2* 8.8*  HCT 26.0* 22.8* 22.9* 22.8* 26.0* 27.5*  MCV 75.4* 76.0*  --  75.5*  --  76.0*  PLT 391 355  --  356  --  356   BMP &GFR Recent Labs  Lab 02/28/21 2350 03/02/21 0242 03/03/21 0818 03/04/21 0730  NA 129* 130* 134* 132*  K 4.1 4.4 4.0 3.9  CL 101 102 107 105  CO2 16* 14* 15* 14*  GLUCOSE 210* 182* 176* 145*  BUN 87* 91* 95* 94*  CREATININE 2.33* 2.90* 2.91* 2.85*  CALCIUM 8.3* 8.2* 8.2* 8.0*  MG  --   --  2.2 2.0  PHOS  --   --  6.3* 6.1*   Estimated Creatinine Clearance: 31.7 mL/min (A) (by C-G formula based on SCr of 2.85 mg/dL (H)). Liver & Pancreas: Recent Labs  Lab 02/28/21 2350 03/03/21 0818 03/04/21 0730  AST 11*  --   --   ALT 14  --   --   ALKPHOS 107  --   --   BILITOT 0.9  --   --   PROT 7.2  --   --   ALBUMIN 2.1* 2.0* 2.0*   No results for input(s): LIPASE, AMYLASE in the last 168 hours. No results for input(s): AMMONIA in the last 168 hours. Diabetic: Recent Labs    03/01/21 1552  HGBA1C 8.4*   Recent Labs  Lab 03/03/21 0622 03/03/21 1135 03/03/21 1809 03/03/21 2028 03/04/21 0609  GLUCAP 167* 188* 181* 156* 141*   Cardiac Enzymes: No results for input(s): CKTOTAL, CKMB, CKMBINDEX, TROPONINI in the last 168 hours. No results for input(s): PROBNP in the last 8760 hours. Coagulation Profile: No results for input(s): INR, PROTIME in the last 168 hours. Thyroid Function Tests: No  results for input(s): TSH, T4TOTAL, FREET4, T3FREE, THYROIDAB in the last 72 hours. Lipid Profile: No results for input(s): CHOL, HDL, LDLCALC, TRIG, CHOLHDL, LDLDIRECT in the last 72 hours. Anemia Panel: Recent Labs    03/02/21 1151  VITAMINB12 471  FOLATE 7.2  FERRITIN 365*  TIBC 167*  IRON 14*  RETICCTPCT 1.1   Urine analysis:    Component Value Date/Time   COLORURINE YELLOW 12/19/2020 0103   APPEARANCEUR CLOUDY (A) 12/19/2020 0103   APPEARANCEUR Clear 12/31/2013 2116   LABSPEC 1.007 12/19/2020 0103   LABSPEC 1.032 12/31/2013 2116   PHURINE 5.0 12/19/2020 0103   GLUCOSEU >=500 (A) 12/19/2020 0103   GLUCOSEU >=500 12/31/2013 2116   HGBUR SMALL (A) 12/19/2020 0103   BILIRUBINUR NEGATIVE 12/19/2020 0103   BILIRUBINUR Negative 12/31/2013 2116   KETONESUR NEGATIVE 12/19/2020 0103   PROTEINUR 100 (A) 12/19/2020 0103   UROBILINOGEN 1.0 08/28/2013 1709   NITRITE NEGATIVE 12/19/2020 0103   LEUKOCYTESUR LARGE (A) 12/19/2020 0103   LEUKOCYTESUR Negative 12/31/2013 2116   Sepsis Labs: Invalid input(s): PROCALCITONIN, [place]  Microbiology: Recent Results (from the past 240 hour(s))  Resp Panel by RT-PCR (Flu A&B, Covid) Nasopharyngeal Swab     Status: None   Collection Time: 03/01/21  8:20 AM   Specimen: Nasopharyngeal Swab; Nasopharyngeal(NP) swabs in vial transport medium  Result Value Ref Range Status   SARS Coronavirus 2 by RT PCR NEGATIVE NEGATIVE Final    Comment: (NOTE) SARS-CoV-2 target nucleic acids are NOT DETECTED.  The SARS-CoV-2 RNA is generally detectable in upper respiratory specimens during the acute phase of infection. The lowest concentration of SARS-CoV-2 viral copies this assay can detect is 138 copies/mL. A negative result does not preclude SARS-Cov-2 infection and should not be used as the sole basis for treatment or other patient management decisions. A negative result may occur with  improper specimen collection/handling, submission of specimen  other than nasopharyngeal swab, presence of viral mutation(s) within the areas targeted by this assay, and inadequate number of viral copies(<138 copies/mL). A negative result must be combined with clinical observations, patient history, and epidemiological information. The expected result is Negative.  Fact Sheet for Patients:  EntrepreneurPulse.com.au  Fact Sheet for Healthcare Providers:  IncredibleEmployment.be  This test is no t yet approved or cleared by the [place] FDA and  has been authorized for detection and/or diagnosis of SARS-CoV-2 by FDA under an Emergency Use Authorization (EUA). This EUA will remain  in effect (meaning this test can be used) for the duration of the COVID-19 declaration under Section 564(b)(1) of the Act, 21 U.S.C.section 360bbb-3(b)(1), unless the authorization is terminated  or revoked sooner.       Influenza A by PCR NEGATIVE NEGATIVE Final   Influenza B by PCR NEGATIVE NEGATIVE Final    Comment: (NOTE) The Xpert Xpress SARS-CoV-2/FLU/RSV plus assay is intended as an aid in the diagnosis of influenza from Nasopharyngeal swab specimens and should not be used as a sole basis for treatment. Nasal washings and aspirates are unacceptable for Xpert Xpress SARS-CoV-2/FLU/RSV testing.  Fact Sheet for Patients: EntrepreneurPulse.com.au  Fact Sheet for Healthcare Providers: IncredibleEmployment.be  This test is not yet approved or cleared by the [place] FDA and has been authorized for detection and/or diagnosis of SARS-CoV-2 by FDA under an Emergency Use Authorization (EUA). This EUA will remain in effect (meaning this test can be used) for the duration of the COVID-19 declaration under Section 564(b)(1) of the Act, 21 U.S.C. section 360bbb-3(b)(1), unless the authorization is terminated or revoked.  Performed at [facility], [place] [address], [place],  [place] [address]   Aerobic/Anaerobic Culture w Gram Stain (surgical/deep wound)     Status: None (Preliminary result)   Collection Time: 03/01/21  1:05 PM   Specimen: Foot, Left; Wound  Result Value Ref Range Status   Specimen Description WOUND  Final   Special Requests LEFT FOOT SPEC A  Final   Gram Stain   Final    NO WBC SEEN Performed at [facility], [address], [place], [place] [address]    Culture   Final    FEW PROTEUS MIRABILIS NO ANAEROBES ISOLATED; CULTURE IN PROGRESS FOR 5 DAYS    Report Status PENDING  Incomplete   Organism ID, Bacteria PROTEUS MIRABILIS  Final      Susceptibility   Proteus mirabilis - MIC*    AMPICILLIN >=32 RESISTANT Resistant     CEFAZOLIN >=64 RESISTANT Resistant     CEFEPIME <=0.12 SENSITIVE Sensitive     CEFTAZIDIME 2 SENSITIVE Sensitive     CEFTRIAXONE 1 SENSITIVE Sensitive     CIPROFLOXACIN <=0.25 SENSITIVE Sensitive     GENTAMICIN <=1 SENSITIVE Sensitive     IMIPENEM 2 SENSITIVE Sensitive     TRIMETH/SULFA <=20 SENSITIVE Sensitive     AMPICILLIN/SULBACTAM >=32 RESISTANT Resistant     PIP/TAZO <=4 SENSITIVE Sensitive     * FEW PROTEUS MIRABILIS  Blood Cultures x 2 sites     Status: None (Preliminary result)   Collection Time: 03/01/21  3:45 PM   Specimen: BLOOD LEFT HAND  Result Value Ref Range Status   Specimen Description BLOOD LEFT HAND  Final   Special Requests   Final    BOTTLES DRAWN AEROBIC AND ANAEROBIC Blood Culture adequate volume   Culture   Final    NO GROWTH 3 DAYS Performed at [facility], [address] [place], [place] [address]    Report Status PENDING  Incomplete  Blood Cultures x 2 sites     Status: None (Preliminary result)   Collection Time: 03/01/21  3:53 PM   Specimen: BLOOD RIGHT ARM  Result Value Ref Range Status   Specimen Description BLOOD RIGHT ARM  Final   Special Requests   Final    BOTTLES DRAWN AEROBIC AND ANAEROBIC Blood Culture results may not be optimal due to an excessive volume of  blood received in culture bottles   Culture   Final    NO GROWTH 3 DAYS Performed at [facility], [place] [address], [place], [place] [address]    Report Status PENDING  Incomplete    Radiology Studies: No results found.    Andee Chivers T. Melfa  If 7PM-7AM, please contact night-coverage [email or web address] 03/04/2021, 1:47 PM

## 2021-03-04 NOTE — Progress Notes (Signed)
Subjective: POD #3 s/p LEFT TMA.  Still having intermittent discomfort to the foot. He states he is hungry but no other concerns this morning. No fevers/chills.    Objective: NAD Status post left transmetatarsal amputation  with sutures intact. Drain in place. Hyperpigmented changes present just proximal to the ankle to the foot.  No significant pain on exam. Overall, unchanged.  Nails in the right foot are significant hypertrophic and dystrophic. No pain with calf compression, swelling, warmth, erythema        Assessment: POD #3 s/p LEFT TMA  Plan: Dressing changed today. See pictures above. Recently went angio yesterday no options for revascularization recommended below-knee amputation of the left side.  Palliative care has also been consulted and discussed with the patients brother.    Celesta Gentile, DPM

## 2021-03-04 NOTE — Op Note (Signed)
PATIENT:  [name]  61 y.o. male   PRE-OPERATIVE DIAGNOSIS:  Infection   POST-OPERATIVE DIAGNOSIS:  Infection   PROCEDURE:  Procedure(s): TRANSMETATARSAL AMPUTATION (Left)   SURGEON:  Surgeon(s) and Role:    * Trula Slade, DPM - Primary   PHYSICIAN ASSISTANT:    ASSISTANTS: none    ANESTHESIA:   none   EBL:  100 mL    BLOOD ADMINISTERED:none   DRAINS: JP drain left foot     LOCAL MEDICATIONS USED:  NONE   SPECIMEN:  wound culture; toes for pathology   DISPOSITION OF SPECIMEN:  PATHOLOGY   COUNTS:  YES   TOURNIQUET:  * Missing tourniquet times found for documented tourniquets in log: [id number] *   DICTATION: .Dragon Dictation   PLAN OF CARE: Admit to inpatient    PATIENT DISPOSITION:  PACU - hemodynamically stable.   Delay start of Pharmacological VTE agent (>24hrs) due to surgical blood loss or risk of bleeding: no  Indications for surgery: 61 year old male was admitted to the hospital overnight for infection to the left foot.  X-rays were concerning for soft tissue emphysema and CT and MRI was subsequently ordered.  Upon evaluation of the patient signs of gangrenous changes present to the third and fourth digits.  There was purulence noted from the wound today during evaluation preoperatively.  Due to this I discussed with him need for transmetatarsal amputation for source control the infection.  After discussion the patient agrees to proceed and consent was signed.  He did sign it with his mark which witnessed by the nursing staff.  I also updated the patient's sister at his request.  All alternatives, risks, complications discussed.  Discussed with him that he has a very high risk of limb loss given concern for circulation status.  Procedure in detail: The patient was both verbally and visually identified by myself, nursing staff, the anesthesia staff preoperatively.  He was then transferred the operating stretcher placed operative table in supine position.   After adequate plane of anesthesia was obtained left lower extremities and scrubbed, prepped, draped in normal sterile fashion.  Timeout was performed.  At this time an incision was made just proximal to the metatarsal heads proximal to the area of the wounds that were present on the MPJs dorsally and a flap was carried plantarly.  Incision was made with a 10 blade scalpel from skin to bone on the dorsal aspect of foot.  There was minimal bleeding noted.  Soft tissue structures are free from the metatarsals.  I then utilized a sagittal saw to cut the metatarsals 1 through 5 on the left side.  Once the metatarsals were cut I did utilize a #10 blade scalpel in order to disarticulate the forefoot as was passed off the table.  I then debrided any nonviable tissue with a 15 with scalpel as well as a rongeur.  After debridement tissue to be to be healthy although there was no significant bleeding noted during the procedure.  Copiously irrigated the wound with saline and hemostasis achieved.  I did could not close the wound at this point with nylon I placed a JP drain.  He was awoken anesthesia and found to tolerate the procedure well any complications.  He is transferred PACU vital signs stable and vascular status intact.  Scheduled for angio tomorrow.  We will need to assess healing potential based on angio.  If the limb is salvageable he may need to return the operating for further debridement.

## 2021-03-05 ENCOUNTER — Encounter (HOSPITAL_COMMUNITY): Payer: Self-pay | Admitting: Vascular Surgery

## 2021-03-05 DIAGNOSIS — R410 Disorientation, unspecified: Secondary | ICD-10-CM

## 2021-03-05 DIAGNOSIS — E11628 Type 2 diabetes mellitus with other skin complications: Secondary | ICD-10-CM | POA: Diagnosis not present

## 2021-03-05 DIAGNOSIS — I5032 Chronic diastolic (congestive) heart failure: Secondary | ICD-10-CM | POA: Diagnosis not present

## 2021-03-05 DIAGNOSIS — N179 Acute kidney failure, unspecified: Secondary | ICD-10-CM | POA: Diagnosis not present

## 2021-03-05 DIAGNOSIS — R441 Visual hallucinations: Secondary | ICD-10-CM

## 2021-03-05 DIAGNOSIS — Z7189 Other specified counseling: Secondary | ICD-10-CM | POA: Diagnosis not present

## 2021-03-05 LAB — GLUCOSE, CAPILLARY
Glucose-Capillary: 135 mg/dL — ABNORMAL HIGH (ref 70–99)
Glucose-Capillary: 130 mg/dL — ABNORMAL HIGH (ref 70–99)
Glucose-Capillary: 136 mg/dL — ABNORMAL HIGH (ref 70–99)
Glucose-Capillary: 121 mg/dL — ABNORMAL HIGH (ref 70–99)

## 2021-03-05 LAB — HEMOGLOBIN AND HEMATOCRIT, BLOOD
HCT: 27.1 % — ABNORMAL LOW (ref 39.0–52.0)
Hemoglobin: 8.9 g/dL — ABNORMAL LOW (ref 13.0–17.0)

## 2021-03-05 LAB — MAGNESIUM: Magnesium: 1.8 mg/dL (ref 1.7–2.4)

## 2021-03-05 LAB — RENAL FUNCTION PANEL
Albumin: 1.9 g/dL — ABNORMAL LOW (ref 3.5–5.0)
Anion gap: 13 (ref 5–15)
BUN: 88 mg/dL — ABNORMAL HIGH (ref 6–20)
CO2: 20 mmol/L — ABNORMAL LOW (ref 22–32)
Calcium: 7.7 mg/dL — ABNORMAL LOW (ref 8.9–10.3)
Chloride: 100 mmol/L (ref 98–111)
Creatinine, Ser: 2.38 mg/dL — ABNORMAL HIGH (ref 0.61–1.24)
GFR, Estimated: 31 mL/min — ABNORMAL LOW (ref >60)
Glucose, Bld: 140 mg/dL — ABNORMAL HIGH (ref 70–99)
Phosphorus: 4.8 mg/dL — ABNORMAL HIGH (ref 2.5–4.6)
Potassium: 3.5 mmol/L (ref 3.5–5.1)
Sodium: 133 mmol/L — ABNORMAL LOW (ref 135–145)

## 2021-03-05 LAB — SURGICAL PATHOLOGY

## 2021-03-05 MED ORDER — FENTANYL CITRATE PF 50 MCG/ML IJ SOSY
25.0000 ug | PREFILLED_SYRINGE | INTRAMUSCULAR | Status: DC | PRN
  Administered 2021-03-07 – 2021-03-12 (×3): 25 ug via INTRAVENOUS
  Filled 2021-03-05 (×3): qty 1

## 2021-03-05 NOTE — Progress Notes (Signed)
[facility]  Subjective  - Patient answers yes no questions and appears comfortable.  No new complaints   Objective 118/85 88 98.4 F (36.9 C) (Oral) 13 100%  Intake/Output Summary (Last 24 hours) at 03/05/2021 0747 Last data filed at 03/05/2021 0500 Gross per 24 hour  Intake 2166.4 ml  Output 1055 ml  Net 1111.4 ml   Left groin soft without hematoma Left foot dressing clean and dry Lungs non labored breathing General no acute distress   Assessment/Planning: POD # 2 61 y.o. male is s/p R leg angiography  Left groin site stable without hematoma and soft No options for revascularization of RLE based on angiography Plan will be for right BKA pending date. RN states patients Brother is coming to visit possibly today.  Roxy Horseman 03/05/2021 7:47 AM --  Laboratory Lab Results: Recent Labs    03/03/21 0818 03/03/21 2021 03/04/21 0730 03/05/21 0609  WBC 7.2  --  7.8  --   HGB 6.9*   < > 8.8* 8.9*  HCT 22.8*   < > 27.5* 27.1*  PLT 356  --  356  --    < > = values in this interval not displayed.   BMET Recent Labs    03/03/21 0818 03/04/21 0730  NA 134* 132*  K 4.0 3.9  CL 107 105  CO2 15* 14*  GLUCOSE 176* 145*  BUN 95* 94*  CREATININE 2.91* 2.85*  CALCIUM 8.2* 8.0*    COAG No results found for: INR, PROTIME No results found for: PTT

## 2021-03-05 NOTE — Consult Note (Signed)
° °  [facility] CM Inpatient Consult   03/05/2021  [name] [birth year] [id number]  [facility] [[facility]] Patient: [name] Medicare  Primary Care Provider:  Jodi Marble, MD  Review reveals patient is long term care at a skilled nursing facility level of care.  Brief review of inpatient [facility] team notes for transition.  Plan:  Will sign off at transition.  For questions or referrals, please contact:   Natividad Brood, RN BSN [facility] Liaison  [phone] business mobile phone Toll free office [phone]  Fax number: [phone] [email or web address]

## 2021-03-05 NOTE — Progress Notes (Signed)
Subjective: POD #4 s/p LEFT TMA.  States he is doing well this morning. He states his pain is controlled and currently no pain. No fevers/chills or other concerns.   Objective: NAD Status post left transmetatarsal amputation  with sutures intact. Drain in place. Hyperpigmented changes present just proximal to the ankle to the foot.  No significant pain on exam. Appears to be stable.  Nails in the right foot are significant hypertrophic and dystrophic. No pain with calf compression, swelling, warmth, erythema               Assessment: POD #4 s/p LEFT TMA  Plan: Dressing changed today. See pictures above. Recently went angio with  no options for revascularization recommended below-knee amputation of the left side.  Palliative care has also been consulted and discussed with the patients brother. BKA pending date. Podiatry will sign off but if we can be of assistance, please let us know.     Celesta Gentile, DPM

## 2021-03-05 NOTE — Care Management Important Message (Signed)
Important Message  Patient Details  Name: [name] MRN: [id number] Date of Birth: Jan 05, 1961   Medicare Important Message Given:  Yes     Shelda Altes 03/05/2021, 10:23 AM

## 2021-03-05 NOTE — Progress Notes (Signed)
[facility] [place] PROGRESS NOTE  ASSESSMENT / PLAN: [name] is a 61 y.o. male with non-salvageable left lower extremity. No options for vascular reconstruction. Will need left below knee amputation, later this week.  SUBJECTIVE: Delirious. I explained need for amputation to him.   OBJECTIVE: BP 126/77 (BP Location: Right Arm)    Pulse 88    Temp 98.2 F (36.8 C) (Oral)    Resp 13    Ht 5\' 8"  (1.727 m)    Wt 97.8 kg    SpO2 98%    BMI 32.78 kg/m   Intake/Output Summary (Last 24 hours) at 03/05/2021 1024 Last data filed at 03/05/2021 0810 Gross per 24 hour  Intake 2116.4 ml  Output 1255 ml  Net 861.4 ml    No distress Blind Regular rate and rhythm Unlabored breathing Soft abdomen Clean L foot dressing  CBC Latest Ref Rng & Units 03/05/2021 03/04/2021 03/03/2021  WBC 4.0 - 10.5 K/uL - 7.8 -  Hemoglobin 13.0 - 17.0 g/dL 8.9(L) 8.8(L) 8.2(L)  Hematocrit 39.0 - 52.0 % 27.1(L) 27.5(L) 26.0(L)  Platelets 150 - 400 K/uL - 356 -     CMP Latest Ref Rng & Units 03/05/2021 03/04/2021 03/03/2021  Glucose 70 - 99 mg/dL 140(H) 145(H) 176(H)  BUN 6 - 20 mg/dL 88(H) 94(H) 95(H)  Creatinine 0.61 - 1.24 mg/dL 2.38(H) 2.85(H) 2.91(H)  Sodium 135 - 145 mmol/L 133(L) 132(L) 134(L)  Potassium 3.5 - 5.1 mmol/L 3.5 3.9 4.0  Chloride 98 - 111 mmol/L 100 105 107  CO2 22 - 32 mmol/L 20(L) 14(L) 15(L)  Calcium 8.9 - 10.3 mg/dL 7.7(L) 8.0(L) 8.2(L)  Total Protein 6.5 - 8.1 g/dL - - -  Total Bilirubin 0.3 - 1.2 mg/dL - - -  Alkaline Phos 38 - 126 U/L - - -  AST 15 - 41 U/L - - -  ALT 0 - 44 U/L - - -    Estimated Creatinine Clearance: 37.9 mL/min (A) (by C-G formula based on SCr of 2.38 mg/dL (H)).  [name]. Roy Breed, MD [facility] Phone Number: [phone] 03/05/2021 10:24 AM

## 2021-03-05 NOTE — Progress Notes (Signed)
PROGRESS NOTE  LYFE MONGER BHA:[id number] DOB: 26-Jun-1960   PCP: Jodi Marble, MD  Patient is from: Nursing home.  Legally blind. Bedbound at baseline.  DOA: 02/28/2021 LOS: 4  Chief complaints:  Chief Complaint  Patient presents with   Wound Check     Brief Narrative / Interim history: 61 year old M with PMH of DM-2, diastolic CHF, XBD-5H, glaucoma, HTN, depression, bipolar disorder, blindness and debility/bedbound at baseline brought to ED with diabetic left foot wound infection.  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  Started on IV vancomycin, ceftriaxone and Flagyl.  Podiatry and vascular surgery consulted.  Patient underwent left TMA amputation by Dr. Jacqualyn Posey on 03/01/2021.  ABI with noncompressible RLE arteries and moderate LLE PAD.  Underwent LLE angiography with no visible pedal circulation to revascularize.  Vascular surgery planning for left BKA later this week.  Palliative medicine consulted and following.  Subjective: Seen and examined earlier this morning.  Patient reports visual hallucination last night.  He is legally blind.  Not able to tell me what he specifically sees.  He denies auditory hallucination.  Denies prior history of hallucination.  He says he is anxious when asked about surgery.  Denies pain, shortness of breath, GI or UTI symptoms.  Oriented to self and place only.  Follows commands.  Objective: Vitals:   03/04/21 2317 03/05/21 0509 03/05/21 0810 03/05/21 1147  BP: 120/78 118/85 126/77 118/86  Pulse: 88  88 80  Resp: _0 Temp: 98.2 F (36.8 C) 98.4 F (36.9 C) 98.2 F (36.8 C) 98 F (36.7 C)  TempSrc: Oral Oral Oral Oral  SpO2: 100%  98% 97%  Weight:      Height:        Examination:  GENERAL: Frail looking male.  No apparent distress. HEENT:  MMM.  Vision and hearing grossly intact.  NECK: Supple.  No apparent JVD.  RESP: 97% on RA.  No IWOB.  Fair aeration bilaterally. CVS:  RRR. Heart sounds normal.  ABD/GI/GU: BS+. Abd soft, NTND.  MSK/EXT:  Moves extremities.  Status post left TMA amputation.  Dressing DCI. SKIN: As above. NEURO: Awake and alert. Oriented to self, place and situation.  Follows command.  No apparent focal neuro deficit. PSYCH: Calm.  Somewhat flat affect.  Reports visual hallucination.  Procedures:  03/01/2021-left transmetatarsal amputation by Dr. Jacqualyn Posey 03/02/2021-LLE angiogram with no visible pedal circulation  Microbiology summarized: COVID-19 and influenza PCR nonreactive. Blood cultures NGTD. Surgical tissue culture Proteus mirabilis.  Assessment & Plan: Diabetic wound infection of left foot  Osteomyelitis of left foot -CRP and ESR elevated. 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  Per OR note, I do not think full source control has been achieved yet -Discontinue Zyvox and Flagyl -Continue IV ceftriaxone -Appreciate input by dietitian -Manage PAD/gangrene as below.  Lower extremity PAD with gangrene -ABI with noncompressible arteries in RLE and mod PAD in LLE.   -LE angiogram with no visible pedal circulation to revascularize. -Vascular surgery planning left BKA later this week. -Continue statin and aspirin  AKI/azotemia on CKD-3A: POA.  Improving. Metabolic acidosis-likely due to renal failure.  Improving. Recent Labs    12/07/20 0254 12/08/20 0743 12/09/20 0734 12/11/20 0531 12/19/20 0006 02/28/21 2350 03/02/21 0242 03/03/21 0818 03/04/21 0730 03/05/21 0609  BUN 21* 24* 26*  33* 32* 87* 91* 95* 94* 88*  CREATININE 1.51* 1.08 1.18 1.37* 1.25* 2.33* 2.90* 2.91* 2.85* 2.38*  -Continue IV sodium bicarbonate -Monitor urine output and renal function closely  Visual hallucination/history of bipolar disorder/depression: Suspect delirium.  Not in distress or agitation.  Looks calm.   Could be from uremia and/or iatrogenic from pain meds.  Add IV fentanyl about 2:30 AM this morning and oxycodone about 12:20 PM yesterday -Delirium precaution -Manage AKI as above -Minimize sedating medications. -Continue home Zoloft.   Chronic diastolic CHF: Stable.  Appears euvolemic on exam.  Slightly hypotensive. -Monitor fluid and respiratory status while on IV fluid -Continue holding Demadex and Entresto given AKI -Discontinued BiDil due to hypotension.   Uncontrolled DM-2 with hyperglycemia, PAD and neuropathy: A1c 8.4%. Recent Labs  Lab 03/04/21 1226 03/04/21 1647 03/04/21 2107 03/05/21 0607 03/05/21 1154  GLUCAP 142* 135* 163* 135* 130*  -Continue SSI-sensitive -Continue basal insulin 3 units daily -Continue statin   Hypotension/history of essential hypertension: Resolved. -Continue IV fluid  -Decreased amlodipine to 5 mg daily -Continue holding Entresto, Demadex and BiDil.   Hyponatremia: Likely from AKI and CKD.  Stable. -Continue monitoring  Anemia of renal disease: Hgb down to 7.1 this morning.  May be some surgical blood loss and hemodilution.  Transfused 1 unit with appropriate response. Recent Labs    12/08/20 0743 12/09/20 0734 12/19/20 0006 02/28/21 2350 03/02/21 0242 03/02/21 1151 03/03/21 0818 03/03/21 2021 03/04/21 0730 03/05/21 0609  HGB 8.7* 8.1* 8.9* 8.0* 7.1* 7.0* 6.9* 8.2* 8.8* 8.9*  -Transfuse for Hgb <7.0.  Glaucoma/blindness -Continue dorzolamide, brimonidine  Debility: Reportedly bedbound at baseline.  He is from nursing home.  Goals of care counseling: Remains full code -Appreciate input by palliative medicine  Class I obesity/malnutrition: Prealbumin 7.9 Body mass index is 32.78 kg/m. Nutrition Problem: Increased nutrient needs Etiology: wound healing Signs/Symptoms: estimated needs Interventions: Refer to RD note for recommendations  DVT prophylaxis:  heparin injection 5,000 Units Start: 03/01/21 2200  Code Status:  Full code Family Communication: Updated patient's brother over the phone on 1/7.  None at bedside today Level of care: Med-Surg.   Status is: Inpatient  Remains inpatient appropriate because: Diabetic wound infection of left foot requiring IV antibiotics and further surgical intervention, PAD with gangrene requiring further surgical intervention.   Final disposition: TBD    Consultants:  Podiatry Vascular surgery Palliative medicine   Sch Meds:  Scheduled Meds:  sodium chloride   Intravenous Once   amLODipine  5 mg Oral Daily   atorvastatin  80 mg Oral QHS   brimonidine  1 drop Both Eyes BID   dorzolamide  1 drop Both Eyes BID   feeding supplement  237 mL Oral BID BM   heparin  5,000 Units Subcutaneous Q8H   insulin aspart  0-5 Units Subcutaneous QHS   insulin aspart  0-9 Units Subcutaneous TID WC   insulin glargine-yfgn  3 Units Subcutaneous Daily   pantoprazole  40 mg Oral Daily   pregabalin  75 mg Oral BID   saccharomyces boulardii  250 mg Oral BID   senna-docusate  1 tablet Oral Daily   sertraline  50 mg Oral QHS   tamsulosin  0.8 mg Oral Daily   Continuous Infusions:  sodium chloride Stopped (03/03/21 0100)   cefTRIAXone (ROCEPHIN)  IV 2 g (03/04/21 2336)    sodium bicarbonate (isotonic) infusion in sterile water 125 mL/hr at 03/05/21 0445   PRN Meds:.acetaminophen **OR** acetaminophen, bisacodyl, fentaNYL (SUBLIMAZE) injection, hydrALAZINE, LORazepam, ondansetron **  OR** ondansetron (ZOFRAN) IV, oxyCODONE, polyethylene glycol  Antimicrobials: Anti-infectives (From admission, onward)    Start     Dose/Rate Route Frequency Ordered Stop   03/02/21 1000  linezolid (ZYVOX) IVPB 600 mg  Status:  Discontinued        600 mg 300 mL/hr over 60 Minutes Intravenous Every 12 hours 03/02/21 0717 03/03/21 1218   03/01/21 1600  cefTRIAXone (ROCEPHIN) 2 g in sodium chloride 0.9 % 100 mL IVPB        2 g 200 mL/hr over 30 Minutes Intravenous Every 24 hours 03/01/21 1507  03/07/21 2159   03/01/21 1600  metroNIDAZOLE (FLAGYL) IVPB 500 mg  Status:  Discontinued        500 mg 100 mL/hr over 60 Minutes Intravenous Every 8 hours 03/01/21 1507 03/03/21 1218   03/01/21 1529  vancomycin variable dose per unstable renal function (pharmacist dosing)  Status:  Discontinued         Does not apply See admin instructions 03/01/21 1529 03/02/21 0717   03/01/21 0015  vancomycin (VANCOREADY) IVPB 2000 mg/400 mL        2,000 mg 200 mL/hr over 120 Minutes Intravenous  Once 03/01/21 0001 03/01/21 0725   03/01/21 0015  clindamycin (CLEOCIN) IVPB 900 mg        900 mg 100 mL/hr over 30 Minutes Intravenous  Once 03/01/21 0010 03/01/21 0104   03/01/21 0015  piperacillin-tazobactam (ZOSYN) IVPB 3.375 g        3.375 g 100 mL/hr over 30 Minutes Intravenous  Once 03/01/21 0012 03/01/21 0216        I have personally reviewed the following labs and images: CBC: Recent Labs  Lab 02/28/21 2350 03/02/21 0242 03/02/21 1151 03/03/21 0818 03/03/21 2021 03/04/21 0730 03/05/21 0609  WBC 12.0* 9.5  --  7.2  --  7.8  --   NEUTROABS 8.9*  --   --   --   --   --   --   HGB 8.0* 7.1* 7.0* 6.9* 8.2* 8.8* 8.9*  HCT 26.0* 22.8* 22.9* 22.8* 26.0* 27.5* 27.1*  MCV 75.4* 76.0*  --  75.5*  --  76.0*  --   PLT 391 355  --  356  --  356  --    BMP &GFR Recent Labs  Lab 02/28/21 2350 03/02/21 0242 03/03/21 0818 03/04/21 0730 03/05/21 0609  NA 129* 130* 134* 132* 133*  K 4.1 4.4 4.0 3.9 3.5  CL 101 102 107 105 100  CO2 16* 14* 15* 14* 20*  GLUCOSE 210* 182* 176* 145* 140*  BUN 87* 91* 95* 94* 88*  CREATININE 2.33* 2.90* 2.91* 2.85* 2.38*  CALCIUM 8.3* 8.2* 8.2* 8.0* 7.7*  MG  --   --  2.2 2.0 1.8  PHOS  --   --  6.3* 6.1* 4.8*   Estimated Creatinine Clearance: 37.9 mL/min (A) (by C-G formula based on SCr of 2.38 mg/dL (H)). Liver & Pancreas: Recent Labs  Lab 02/28/21 2350 03/03/21 0818 03/04/21 0730 03/05/21 0609  AST 11*  --   --   --   ALT 14  --   --   --   ALKPHOS 107   --   --   --   BILITOT 0.9  --   --   --   PROT 7.2  --   --   --   ALBUMIN 2.1* 2.0* 2.0* 1.9*   No results for input(s): LIPASE, AMYLASE in the last 168 hours. No results for input(s):  AMMONIA in the last 168 hours. Diabetic: No results for input(s): HGBA1C in the last 72 hours.  Recent Labs  Lab 03/04/21 1226 03/04/21 1647 03/04/21 2107 03/05/21 0607 03/05/21 1154  GLUCAP 142* 135* 163* 135* 130*   Cardiac Enzymes: No results for input(s): CKTOTAL, CKMB, CKMBINDEX, TROPONINI in the last 168 hours. No results for input(s): PROBNP in the last 8760 hours. Coagulation Profile: No results for input(s): INR, PROTIME in the last 168 hours. Thyroid Function Tests: No results for input(s): TSH, T4TOTAL, FREET4, T3FREE, THYROIDAB in the last 72 hours. Lipid Profile: No results for input(s): CHOL, HDL, LDLCALC, TRIG, CHOLHDL, LDLDIRECT in the last 72 hours. Anemia Panel: No results for input(s): VITAMINB12, FOLATE, FERRITIN, TIBC, IRON, RETICCTPCT in the last 72 hours.  Urine analysis:    Component Value Date/Time   COLORURINE YELLOW 12/19/2020 0103   APPEARANCEUR CLOUDY (A) 12/19/2020 0103   APPEARANCEUR Clear 12/31/2013 2116   LABSPEC 1.007 12/19/2020 0103   LABSPEC 1.032 12/31/2013 2116   PHURINE 5.0 12/19/2020 0103   GLUCOSEU >=500 (A) 12/19/2020 0103   GLUCOSEU >=500 12/31/2013 2116   HGBUR SMALL (A) 12/19/2020 0103   BILIRUBINUR NEGATIVE 12/19/2020 0103   BILIRUBINUR Negative 12/31/2013 2116   KETONESUR NEGATIVE 12/19/2020 0103   PROTEINUR 100 (A) 12/19/2020 0103   UROBILINOGEN 1.0 08/28/2013 1709   NITRITE NEGATIVE 12/19/2020 0103   LEUKOCYTESUR LARGE (A) 12/19/2020 0103   LEUKOCYTESUR Negative 12/31/2013 2116   Sepsis Labs: Invalid input(s): PROCALCITONIN, [place]  Microbiology: Recent Results (from the past 240 hour(s))  Resp Panel by RT-PCR (Flu A&B, Covid) Nasopharyngeal Swab     Status: None   Collection Time: 03/01/21  8:20 AM   Specimen:  Nasopharyngeal Swab; Nasopharyngeal(NP) swabs in vial transport medium  Result Value Ref Range Status   SARS Coronavirus 2 by RT PCR NEGATIVE NEGATIVE Final    Comment: (NOTE) SARS-CoV-2 target nucleic acids are NOT DETECTED.  The SARS-CoV-2 RNA is generally detectable in upper respiratory specimens during the acute phase of infection. The lowest concentration of SARS-CoV-2 viral copies this assay can detect is 138 copies/mL. A negative result does not preclude SARS-Cov-2 infection and should not be used as the sole basis for treatment or other patient management decisions. A negative result may occur with  improper specimen collection/handling, submission of specimen other than nasopharyngeal swab, presence of viral mutation(s) within the areas targeted by this assay, and inadequate number of viral copies(<138 copies/mL). A negative result must be combined with clinical observations, patient history, and epidemiological information. The expected result is Negative.  Fact Sheet for Patients:  EntrepreneurPulse.com.au  Fact Sheet for Healthcare Providers:  IncredibleEmployment.be  This test is no t yet approved or cleared by the [place] FDA and  has been authorized for detection and/or diagnosis of SARS-CoV-2 by FDA under an Emergency Use Authorization (EUA). This EUA will remain  in effect (meaning this test can be used) for the duration of the COVID-19 declaration under Section 564(b)(1) of the Act, 21 U.S.C.section 360bbb-3(b)(1), unless the authorization is terminated  or revoked sooner.       Influenza A by PCR NEGATIVE NEGATIVE Final   Influenza B by PCR NEGATIVE NEGATIVE Final    Comment: (NOTE) The Xpert Xpress SARS-CoV-2/FLU/RSV plus assay is intended as an aid in the diagnosis of influenza from Nasopharyngeal swab specimens and should not be used as a sole basis for treatment. Nasal washings and aspirates are unacceptable for  Xpert Xpress SARS-CoV-2/FLU/RSV testing.  Fact Sheet for Patients: EntrepreneurPulse.com.au  Fact Sheet for Healthcare Providers: IncredibleEmployment.be  This test is not yet approved or cleared by the [place] FDA and has been authorized for detection and/or diagnosis of SARS-CoV-2 by FDA under an Emergency Use Authorization (EUA). This EUA will remain in effect (meaning this test can be used) for the duration of the COVID-19 declaration under Section 564(b)(1) of the Act, 21 U.S.C. section 360bbb-3(b)(1), unless the authorization is terminated or revoked.  Performed at [facility], [place] [address], [place], [place] [address]   Aerobic/Anaerobic Culture w Gram Stain (surgical/deep wound)     Status: None (Preliminary result)   Collection Time: 03/01/21  1:05 PM   Specimen: Foot, Left; Wound  Result Value Ref Range Status   Specimen Description WOUND  Final   Special Requests LEFT FOOT SPEC A  Final   Gram Stain   Final    NO WBC SEEN NO ORGANISMS SEEN Performed at [facility], [address] [place], [place] [address]    Culture   Final    FEW PROTEUS MIRABILIS NO ANAEROBES ISOLATED; CULTURE IN PROGRESS FOR 5 DAYS    Report Status PENDING  Incomplete   Organism ID, Bacteria PROTEUS MIRABILIS  Final      Susceptibility   Proteus mirabilis - MIC*    AMPICILLIN >=32 RESISTANT Resistant     CEFAZOLIN >=64 RESISTANT Resistant     CEFEPIME <=0.12 SENSITIVE Sensitive     CEFTAZIDIME 2 SENSITIVE Sensitive     CEFTRIAXONE 1 SENSITIVE Sensitive     CIPROFLOXACIN <=0.25 SENSITIVE Sensitive     GENTAMICIN <=1 SENSITIVE Sensitive     IMIPENEM 2 SENSITIVE Sensitive     TRIMETH/SULFA <=20 SENSITIVE Sensitive     AMPICILLIN/SULBACTAM >=32 RESISTANT Resistant     PIP/TAZO <=4 SENSITIVE Sensitive     * FEW PROTEUS MIRABILIS  Blood Cultures x 2 sites     Status: None (Preliminary result)   Collection Time: 03/01/21  3:45 PM    Specimen: BLOOD LEFT HAND  Result Value Ref Range Status   Specimen Description BLOOD LEFT HAND  Final   Special Requests   Final    BOTTLES DRAWN AEROBIC AND ANAEROBIC Blood Culture adequate volume   Culture   Final    NO GROWTH 4 DAYS Performed at [facility], [address] [place], [place] [address]    Report Status PENDING  Incomplete  Blood Cultures x 2 sites     Status: None (Preliminary result)   Collection Time: 03/01/21  3:53 PM   Specimen: BLOOD RIGHT ARM  Result Value Ref Range Status   Specimen Description BLOOD RIGHT ARM  Final   Special Requests   Final    BOTTLES DRAWN AEROBIC AND ANAEROBIC Blood Culture results may not be optimal due to an excessive volume of blood received in culture bottles   Culture   Final    NO GROWTH 4 DAYS Performed at [facility], [place] [address] [place], [place] [address]    Report Status PENDING  Incomplete    Radiology Studies: No results found.    Delores Edelstein T. [place]  If 7PM-7AM, please contact night-coverage [email or web address] 03/05/2021, 2:22 PM

## 2021-03-06 DIAGNOSIS — Z7189 Other specified counseling: Secondary | ICD-10-CM | POA: Diagnosis not present

## 2021-03-06 DIAGNOSIS — E11628 Type 2 diabetes mellitus with other skin complications: Secondary | ICD-10-CM | POA: Diagnosis not present

## 2021-03-06 DIAGNOSIS — I5032 Chronic diastolic (congestive) heart failure: Secondary | ICD-10-CM | POA: Diagnosis not present

## 2021-03-06 DIAGNOSIS — R338 Other retention of urine: Secondary | ICD-10-CM

## 2021-03-06 DIAGNOSIS — Z515 Encounter for palliative care: Secondary | ICD-10-CM | POA: Diagnosis not present

## 2021-03-06 DIAGNOSIS — E876 Hypokalemia: Secondary | ICD-10-CM

## 2021-03-06 DIAGNOSIS — N179 Acute kidney failure, unspecified: Secondary | ICD-10-CM | POA: Diagnosis not present

## 2021-03-06 LAB — CULTURE, BLOOD (ROUTINE X 2)
Culture: NO GROWTH
Culture: NO GROWTH
Special Requests: ADEQUATE

## 2021-03-06 LAB — RENAL FUNCTION PANEL
Albumin: 1.9 g/dL — ABNORMAL LOW (ref 3.5–5.0)
Anion gap: 14 (ref 5–15)
BUN: 82 mg/dL — ABNORMAL HIGH (ref 6–20)
CO2: 22 mmol/L (ref 22–32)
Calcium: 7.7 mg/dL — ABNORMAL LOW (ref 8.9–10.3)
Chloride: 98 mmol/L (ref 98–111)
Creatinine, Ser: 1.93 mg/dL — ABNORMAL HIGH (ref 0.61–1.24)
GFR, Estimated: 39 mL/min — ABNORMAL LOW (ref >60)
Glucose, Bld: 142 mg/dL — ABNORMAL HIGH (ref 70–99)
Phosphorus: 4.6 mg/dL (ref 2.5–4.6)
Potassium: 3 mmol/L — ABNORMAL LOW (ref 3.5–5.1)
Sodium: 134 mmol/L — ABNORMAL LOW (ref 135–145)

## 2021-03-06 LAB — URINALYSIS, COMPLETE (UACMP) WITH MICROSCOPIC
Bilirubin Urine: NEGATIVE
Glucose, UA: 100 mg/dL — AB
Ketones, ur: NEGATIVE mg/dL
Nitrite: NEGATIVE
Protein, ur: 30 mg/dL — AB
Specific Gravity, Urine: 1.025 (ref 1.005–1.030)
WBC, UA: >50 WBC/hpf (ref 0–5)
pH: 5.5 (ref 5.0–8.0)

## 2021-03-06 LAB — HEMOGLOBIN AND HEMATOCRIT, BLOOD
HCT: 27.6 % — ABNORMAL LOW (ref 39.0–52.0)
Hemoglobin: 8.8 g/dL — ABNORMAL LOW (ref 13.0–17.0)

## 2021-03-06 LAB — AEROBIC/ANAEROBIC CULTURE W GRAM STAIN (SURGICAL/DEEP WOUND): Gram Stain: NONE SEEN

## 2021-03-06 LAB — GLUCOSE, CAPILLARY
Glucose-Capillary: 143 mg/dL — ABNORMAL HIGH (ref 70–99)
Glucose-Capillary: 187 mg/dL — ABNORMAL HIGH (ref 70–99)
Glucose-Capillary: 181 mg/dL — ABNORMAL HIGH (ref 70–99)
Glucose-Capillary: 160 mg/dL — ABNORMAL HIGH (ref 70–99)

## 2021-03-06 LAB — MAGNESIUM: Magnesium: 1.9 mg/dL (ref 1.7–2.4)

## 2021-03-06 MED ORDER — QUETIAPINE FUMARATE 25 MG PO TABS
25.0000 mg | ORAL_TABLET | Freq: Every day | ORAL | Status: DC
  Administered 2021-03-06 – 2021-03-08 (×3): 25 mg via ORAL
  Filled 2021-03-06 (×4): qty 1

## 2021-03-06 MED ORDER — ADULT MULTIVITAMIN W/MINERALS CH
1.0000 | ORAL_TABLET | Freq: Every day | ORAL | Status: DC
  Administered 2021-03-06 – 2021-03-12 (×4): 1 via ORAL
  Filled 2021-03-06 (×6): qty 1

## 2021-03-06 MED ORDER — POTASSIUM CHLORIDE CRYS ER 20 MEQ PO TBCR
40.0000 meq | EXTENDED_RELEASE_TABLET | ORAL | Status: DC
  Administered 2021-03-06: 20 meq via ORAL
  Filled 2021-03-06: qty 2

## 2021-03-06 MED ORDER — POTASSIUM CHLORIDE 10 MEQ/100ML IV SOLN
10.0000 meq | INTRAVENOUS | Status: AC
  Administered 2021-03-06 (×4): 10 meq via INTRAVENOUS
  Filled 2021-03-06 (×2): qty 100

## 2021-03-06 MED ORDER — CHLORHEXIDINE GLUCONATE CLOTH 2 % EX PADS
6.0000 | MEDICATED_PAD | Freq: Every day | CUTANEOUS | Status: DC
  Administered 2021-03-06 – 2021-03-12 (×6): 6 via TOPICAL

## 2021-03-06 NOTE — Progress Notes (Signed)
Dr. Bridgett Larsson text paged .

## 2021-03-06 NOTE — Progress Notes (Addendum)
Palliative Medicine Inpatient Follow Up Note  Consulting Provider: Mercy Riding, MD   Reason for consult:     Answer  [place] Palliative Medicine Consult  Reason for Consult? goal of care discussion    HPI:  Per intake H&P --> [name] is a 61 y.o. male with medical history significant of bipolar d/o; DM; HTN; and chronic diastolic CHF presenting with a foot infection.  He was unaware of an issue with his feet and denies pain but also denies sensation loss.  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     The Palliative care team saw [name] one year ago in the setting of volume overload. At that time he was made a DNR as per conversations with he, his brother, and father. It appears that he is now Full Code.    Palliative care has been consulted to further discuss goals of care and code status in the setting of having severe PAD requiring a left BKA.   Today's Discussion (03/06/2021):  *Please note that this is a verbal dictation therefore any spelling or grammatical errors are due to the "[place] [employer]" system interpretation.  Chart reviewed inclusive of progress notes, laboratory results, and diagnostic images.   I met with [name] at bedside. He is presently awake and telling me that he wants, " a hot meal." I asked him to clarify what he would like and he continues to perseverate on getting a hot meal. I asked him if he understood why he was in the hospital which he said he did not. We spoke about his left lower extremity. We reviewed the plan for an amputation. I asked him if he would want this and he says, "no, would you?" I shared I do not think anyone would wish to lose a limb. We reviewed the reasoning why this is the only option for him. He did not seem to clearly understand this though. He was in agreement with my reaching out to his brother [name].  I called patients brother though he did not respond therefore a message was  left.  ________________________  A secure chat was sent to the patients primary medical attending and vascular surgery. Per their conversation with family plan for amputation tomorrow. Goals are for full aggressive care.  Objective Assessment: Vital Signs Vitals:   03/06/21 0836 03/06/21 1145  BP: 118/82 109/75  Pulse: 80 85  Resp: 20 20  Temp: 98.2 F (36.8 C) 98 F (36.7 C)  SpO2: 98% 97%    Intake/Output Summary (Last 24 hours) at 03/06/2021 1323 Last data filed at 03/06/2021 1150 Gross per 24 hour  Intake 340 ml  Output 1765 ml  Net -1425 ml   Last Weight  Most recent update: 03/06/2021  6:16 AM    Weight  97.8 kg (215 lb 9.8 oz)            Gen:  AA M in NAD HEENT: Dry mucous membranes, very poor oral hygiene CV: Regular rate and rhythm  PULM: on RA, clear to auscultation bilaterally  ABD: Soft EXT: (+) LLE ace wrap on foot with JP drain Neuro: Alert and oriented x2  SUMMARY OF RECOMMENDATIONS   Full Code/ Full scope of care  Plan for left BKA tomorrow  Patient quite declined since he was seen one year ago, no longer able to propel in a wheelchair. Is now bed-bound. Long term prognosis is very worrisome considering his declined state with or  without amputation.    Ongoing incremental Palliative support  MDM High in the setting of complex decision making for additional surgery (BKA). Escalation or de-escalation of care. ______________________________________________________________________________________ [place] Team Team Cell Phone: [phone] Please utilize secure chat with additional questions, if there is no response within 30 minutes please call the above phone number  Palliative Medicine Team providers are available by phone from 7am to 7pm daily and can be reached through the team cell phone.  Should this patient require assistance outside of these hours, please call the patient's attending  physician.

## 2021-03-06 NOTE — Progress Notes (Addendum)
Nutrition Follow-up  DOCUMENTATION CODES:   Non-severe (moderate) malnutrition in context of chronic illness  INTERVENTION:   D/C Ensure Enlive, patient is not drinking.  Add MVI with minerals daily.  Allow double entree portions with meals.  Change meal service to room service with assistance.   Add Vital Cuisine Shake BID with meals, each supplement provides 520 kcal and 22 grams of protein.  NUTRITION DIAGNOSIS:   Moderate Malnutrition related to chronic illness (CHF, CKD) as evidenced by mild muscle depletion, moderate muscle depletion, moderate fat depletion.  Ongoing   GOAL:   Patient will meet greater than or equal to 90% of their needs  Progressing   MONITOR:   PO intake, Supplement acceptance, Labs, Skin  REASON FOR ASSESSMENT:   Consult Wound healing  ASSESSMENT:   61 year old M with PMH of DM-2, diastolic CHF, PJK-9T, glaucoma, HTN, depression, bipolar disorder, blindness and debility/bedbound at baseline brought to ED with diabetic left foot wound infection.  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  Started on IV vancomycin, ceftriaxone and Flagyl.  Podiatry and vascular surgery consulted.  Patient underwent left TMA amputation by Dr. Jacqualyn Posey on 03/01/2021.  ABI with noncompressible RLE arteries and moderate LLE PAD.  Plan for lower extremity angiography on 03/02/2021.  Plans for left BKA later this week.  Patient slow to respond to questions. He says he is not getting enough to eat, then clarifies that he is not getting what he wants to eat. He wants a hot dog. Encouraged patient to call and order his meals and he should be able to receive a hot dog. Nurse tech is assisting patient with ordering meals. He does not like the Ensure, agreed to receive double entree portions  with meals.   Currently on a regular diet. Meal intakes recorded at 0-85%.   Labs reviewed. NA 134, K 3 CBG: 143-187  Medications reviewed and include Novolog, Semglee, Protonix, Florastor, Flomax, KCl, sodium bicarb.  Admission weight 91.2 kg Current weight 97.8 kg Weight increase r/t edema.  Patient meets criteria for moderate malnutrition with mild-moderate depletion of muscle and subcutaneous fat mass.   NUTRITION - FOCUSED PHYSICAL EXAM:  Flowsheet Row Most Recent Value  Orbital Region Severe depletion  Upper Arm Region Moderate depletion  Thoracic and Lumbar Region Moderate depletion  Buccal Region Moderate depletion  Temple Region Severe depletion  Clavicle Bone Region Moderate depletion  Clavicle and Acromion Bone Region Moderate depletion  Scapular Bone Region Moderate depletion  Dorsal Hand Moderate depletion  Patellar Region Mild depletion  Anterior Thigh Region Mild depletion  Posterior Calf Region Mild depletion  Edema (RD Assessment) Mild  Hair Reviewed  Eyes Reviewed  Mouth Reviewed  [poor dentition,  multiple missing teeth]  Skin Reviewed  Nails Reviewed       Diet Order:   Diet Order             Diet regular Room service appropriate? Yes with Assist; Fluid consistency: Thin  Diet effective now                   EDUCATION NEEDS:   No education needs have been identified at this time  Skin:  Skin Assessment: Skin Integrity Issues: Skin Integrity Issues:: Incisions, Stage II, Other (Comment) Stage II: penis (MARSI) Incisions: L foot TMA Other: MASD scrotum; pressure injury  coccyx (not staged)  Last BM:  1/9  Height:   Ht Readings from Last 1 Encounters:  03/01/21 5\' 8"  (1.727 m)    Weight:   Wt Readings from Last 1 Encounters:  03/06/21 97.8 kg    BMI:  Body mass index is 32.78 kg/m.  Estimated Nutritional Needs:   Kcal:  2200-2400  Protein:  110-120 grams  Fluid:  >2.2L    Lucas Mallow RD, LDN, CNSC Please refer  to Amion for contact information.

## 2021-03-06 NOTE — Progress Notes (Signed)
PROGRESS NOTE  [name] JJH:[id number] DOB: 03-11-1960   PCP: Jodi Marble, MD  Patient is from: Nursing home.  Legally blind. Bedbound at baseline.  DOA: 02/28/2021 LOS: 5  Chief complaints:  Chief Complaint  Patient presents with   Wound Check     Brief Narrative / Interim history: 61 year old M with PMH of DM-2, diastolic CHF, YJE-5U, glaucoma, HTN, depression, bipolar disorder, blindness and debility/bedbound at baseline brought to ED with diabetic left foot wound infection.  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  Started on IV vancomycin, ceftriaxone and Flagyl.  Podiatry and vascular surgery consulted.  Patient underwent left TMA amputation by Dr. Jacqualyn Posey on 03/01/2021.  ABI with noncompressible RLE arteries and moderate LLE PAD.  Underwent LLE angiography with no visible pedal circulation to revascularize.  Vascular surgery planning for left BKA on 03/07/2021.  Palliative medicine consulted and following.  Subjective: Seen and examined earlier this morning.  He had a urinary retention with bladder scan showing 942 cc.  Foley catheter placed with removal of about 1 L urine.  Continues to endorse visual hallucination that he thinks is related to medications.  Pain fairly controlled.  He denies nausea or vomiting.  Anxious about surgery. Objective: Vitals:   03/06/21 0616 03/06/21 0800 03/06/21 0836 03/06/21 1145  BP:  118/82 118/82 109/75  Pulse:  68 80 85  Resp:  18 20 20   Temp:  98.2 F (36.8 C) 98.2 F (36.8 C) 98 F (36.7 C)  TempSrc:  Oral Oral Oral  SpO2:  99% 98% 97%  Weight: 97.8 kg     Height:        Examination:  GENERAL: Frail looking male.  No apparent distress. HEENT: MMM.  Legally blind.  Hearing grossly intact. NECK: Supple.  No apparent JVD.  RESP: 97% on  RA.  No IWOB.  Fair aeration bilaterally. CVS:  RRR. Heart sounds normal.  ABD/GI/GU: BS+. Abd soft, NTND.  MSK/EXT:  Moves extremities. No apparent deformity. No edema.  SKIN: Status post left TMA amputation.  Dressing DCI. NEURO: Awake and alert. Oriented to self, place, family and situation.  Follows commands.  No apparent focal neuro deficit. PSYCH: Calm.  Reports visual hallucination.  Procedures:  03/01/2021-left transmetatarsal amputation by Dr. Jacqualyn Posey 03/02/2021-LLE angiogram with no visible pedal circulation  Microbiology summarized: COVID-19 and influenza PCR nonreactive. Blood cultures NGTD. Surgical tissue culture Proteus mirabilis.  Assessment & Plan: Diabetic wound infection of left foot  Osteomyelitis of left foot -CRP and ESR elevated. 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  Per OR note, I do not think full source control has been achieved yet -Discontinue Zyvox and Flagyl -Continue IV ceftriaxone -Appreciate input by dietitian -Plan for left BKA on 1/11.  Lower extremity PAD with gangrene -ABI with noncompressible arteries in RLE and mod PAD in LLE.   -LE angiogram with no visible pedal circulation to revascularize. -Vascular surgery planning left BKA on 1/11. -Continue statin and aspirin  AKI/azotemia on CKD-3A: POA.  Improving. Metabolic acidosis-likely due to renal failure.  Improving. Recent Labs    12/08/20 0743 12/09/20 0734 12/11/20 0531 12/19/20 0006 02/28/21 2350 03/02/21 0242 03/03/21 0818 03/04/21 0730 03/05/21 0609 03/06/21 0353  BUN 24* 26* 33* 32* 87* 91* 95* 94* 88* 82*  CREATININE 1.08 1.18 1.37* 1.25* 2.33* 2.90* 2.91*  2.85* 2.38* 1.93*  -Continue IV sodium bicarbonate -Monitor urine output and renal function closely  Acute urinary retention: Bladder scan 943 cc on 1/9.  Indwelling Foley inserted.  -Continue indwelling Foley catheter -Voiding trial in 5 to 7 days.  Visual hallucination/history of bipolar disorder/depression:  Suspect delirium.  Not in distress or agitation.  Looks calm.  Could be from uremia and/or iatrogenic from pain meds.   -Delirium precaution -Manage AKI as above -Minimize sedating medications. -Continue home Zoloft. -Added low-dose Seroquel at night   Chronic diastolic CHF: Stable.  Appears euvolemic on exam.  Slightly hypotensive. -Monitor fluid and respiratory status while on IV fluid -Continue holding Demadex and Entresto given AKI -Discontinued BiDil due to hypotension.   Uncontrolled DM-2 with hyperglycemia, PAD and neuropathy: A1c 8.4%. Recent Labs  Lab 03/05/21 1154 03/05/21 1531 03/05/21 2115 03/06/21 0614 03/06/21 1142  GLUCAP 130* 136* 121* 143* 187*  -Continue SSI-sensitive -Continue basal insulin 3 units daily -Continue statin   Hypotension/history of essential hypertension: Resolved. -Decreased amlodipine to 5 mg daily -Continue holding Entresto, Demadex and BiDil.   Hyponatremia: Likely from AKI and CKD.  Stable. -Continue monitoring  Hypokalemia: K3.0.  Refused p.o. KCl after 20 mEq. -IV KCl 40x1  Anemia of renal disease: Hgb down to 7.1 this morning.  May be some surgical blood loss and hemodilution.  Transfused 1 unit with appropriate response. Recent Labs    12/09/20 0734 12/19/20 0006 02/28/21 2350 03/02/21 0242 03/02/21 1151 03/03/21 0818 03/03/21 2021 03/04/21 0730 03/05/21 0609 03/06/21 0353  HGB 8.1* 8.9* 8.0* 7.1* 7.0* 6.9* 8.2* 8.8* 8.9* 8.8*  -Transfuse for Hgb <7.0.  Glaucoma/blindness -Continue dorzolamide, brimonidine  Debility: Reportedly bedbound at baseline.  He is from nursing home.  Goals of care counseling: Remains full code -Appreciate input by palliative medicine  Class I obesity/malnutrition: Prealbumin 7.9 Body mass index is 32.78 kg/m. Nutrition Problem: Moderate Malnutrition Etiology: chronic illness (CHF, CKD) Signs/Symptoms: mild muscle depletion, moderate muscle depletion, moderate fat  depletion Interventions: MVI, Hormel Shake, Liberalize Diet  DVT prophylaxis:  heparin injection 5,000 Units Start: 03/01/21 2200  Code Status: Full code Family Communication: Updated patient's brother at bedside. Level of care: Med-Surg.   Status is: Inpatient  Remains inpatient appropriate because: Diabetic wound infection of left foot requiring IV antibiotics and further surgical intervention, PAD with gangrene requiring further surgical intervention.   Final disposition: TBD    Consultants:  Podiatry Vascular surgery Palliative medicine   Sch Meds:  Scheduled Meds:  sodium chloride   Intravenous Once   amLODipine  5 mg Oral Daily   atorvastatin  80 mg Oral QHS   brimonidine  1 drop Both Eyes BID   Chlorhexidine Gluconate Cloth  6 each Topical Daily   dorzolamide  1 drop Both Eyes BID   heparin  5,000 Units Subcutaneous Q8H   insulin aspart  0-5 Units Subcutaneous QHS   insulin aspart  0-9 Units Subcutaneous TID WC   insulin glargine-yfgn  3 Units Subcutaneous Daily   multivitamin with minerals  1 tablet Oral Daily   pantoprazole  40 mg Oral Daily   pregabalin  75 mg Oral BID   QUEtiapine  25 mg Oral QHS   saccharomyces boulardii  250 mg Oral BID   senna-docusate  1 tablet Oral Daily   sertraline  50 mg Oral QHS   tamsulosin  0.8 mg Oral Daily   Continuous Infusions:  sodium chloride 10 mL/hr at 03/06/21 0220   cefTRIAXone (ROCEPHIN)  IV 2 g (  03/05/21 2114)   potassium chloride 10 mEq (03/06/21 1241)    sodium bicarbonate (isotonic) infusion in sterile water 125 mL/hr at 03/06/21 0321   PRN Meds:.acetaminophen **OR** acetaminophen, bisacodyl, fentaNYL (SUBLIMAZE) injection, hydrALAZINE, LORazepam, ondansetron **OR** ondansetron (ZOFRAN) IV, oxyCODONE, polyethylene glycol  Antimicrobials: Anti-infectives (From admission, onward)    Start     Dose/Rate Route Frequency Ordered Stop   03/02/21 1000  linezolid (ZYVOX) IVPB 600 mg  Status:  Discontinued         600 mg 300 mL/hr over 60 Minutes Intravenous Every 12 hours 03/02/21 0717 03/03/21 1218   03/01/21 1600  cefTRIAXone (ROCEPHIN) 2 g in sodium chloride 0.9 % 100 mL IVPB        2 g 200 mL/hr over 30 Minutes Intravenous Every 24 hours 03/01/21 1507 03/07/21 2159   03/01/21 1600  metroNIDAZOLE (FLAGYL) IVPB 500 mg  Status:  Discontinued        500 mg 100 mL/hr over 60 Minutes Intravenous Every 8 hours 03/01/21 1507 03/03/21 1218   03/01/21 1529  vancomycin variable dose per unstable renal function (pharmacist dosing)  Status:  Discontinued         Does not apply See admin instructions 03/01/21 1529 03/02/21 0717   03/01/21 0015  vancomycin (VANCOREADY) IVPB 2000 mg/400 mL        2,000 mg 200 mL/hr over 120 Minutes Intravenous  Once 03/01/21 0001 03/01/21 0725   03/01/21 0015  clindamycin (CLEOCIN) IVPB 900 mg        900 mg 100 mL/hr over 30 Minutes Intravenous  Once 03/01/21 0010 03/01/21 0104   03/01/21 0015  piperacillin-tazobactam (ZOSYN) IVPB 3.375 g        3.375 g 100 mL/hr over 30 Minutes Intravenous  Once 03/01/21 0012 03/01/21 0216        I have personally reviewed the following labs and images: CBC: Recent Labs  Lab 02/28/21 2350 03/02/21 0242 03/02/21 1151 03/03/21 0818 03/03/21 2021 03/04/21 0730 03/05/21 0609 03/06/21 0353  WBC 12.0* 9.5  --  7.2  --  7.8  --   --   NEUTROABS 8.9*  --   --   --   --   --   --   --   HGB 8.0* 7.1*   < > 6.9* 8.2* 8.8* 8.9* 8.8*  HCT 26.0* 22.8*   < > 22.8* 26.0* 27.5* 27.1* 27.6*  MCV 75.4* 76.0*  --  75.5*  --  76.0*  --   --   PLT 391 355  --  356  --  356  --   --    < > = values in this interval not displayed.   BMP &GFR Recent Labs  Lab 03/02/21 0242 03/03/21 0818 03/04/21 0730 03/05/21 0609 03/06/21 0353  NA 130* 134* 132* 133* 134*  K 4.4 4.0 3.9 3.5 3.0*  CL 102 107 105 100 98  CO2 14* 15* 14* 20* 22  GLUCOSE 182* 176* 145* 140* 142*  BUN 91* 95* 94* 88* 82*  CREATININE 2.90* 2.91* 2.85* 2.38* 1.93*  CALCIUM  8.2* 8.2* 8.0* 7.7* 7.7*  MG  --  2.2 2.0 1.8 1.9  PHOS  --  6.3* 6.1* 4.8* 4.6   Estimated Creatinine Clearance: 46.7 mL/min (A) (by C-G formula based on SCr of 1.93 mg/dL (H)). Liver & Pancreas: Recent Labs  Lab 02/28/21 2350 03/03/21 0818 03/04/21 0730 03/05/21 0609 03/06/21 0353  AST 11*  --   --   --   --  ALT 14  --   --   --   --   ALKPHOS 107  --   --   --   --   BILITOT 0.9  --   --   --   --   PROT 7.2  --   --   --   --   ALBUMIN 2.1* 2.0* 2.0* 1.9* 1.9*   No results for input(s): LIPASE, AMYLASE in the last 168 hours. No results for input(s): AMMONIA in the last 168 hours. Diabetic: No results for input(s): HGBA1C in the last 72 hours.  Recent Labs  Lab 03/05/21 1154 03/05/21 1531 03/05/21 2115 03/06/21 0614 03/06/21 1142  GLUCAP 130* 136* 121* 143* 187*   Cardiac Enzymes: No results for input(s): CKTOTAL, CKMB, CKMBINDEX, TROPONINI in the last 168 hours. No results for input(s): PROBNP in the last 8760 hours. Coagulation Profile: No results for input(s): INR, PROTIME in the last 168 hours. Thyroid Function Tests: No results for input(s): TSH, T4TOTAL, FREET4, T3FREE, THYROIDAB in the last 72 hours. Lipid Profile: No results for input(s): CHOL, HDL, LDLCALC, TRIG, CHOLHDL, LDLDIRECT in the last 72 hours. Anemia Panel: No results for input(s): VITAMINB12, FOLATE, FERRITIN, TIBC, IRON, RETICCTPCT in the last 72 hours.  Urine analysis:    Component Value Date/Time   COLORURINE YELLOW 03/06/2021 0702   APPEARANCEUR CLEAR 03/06/2021 0702   APPEARANCEUR Clear 12/31/2013 2116   LABSPEC 1.025 03/06/2021 0702   LABSPEC 1.032 12/31/2013 2116   PHURINE 5.5 03/06/2021 0702   GLUCOSEU 100 (A) 03/06/2021 0702   GLUCOSEU >=500 12/31/2013 2116   HGBUR TRACE (A) 03/06/2021 0702   BILIRUBINUR NEGATIVE 03/06/2021 0702   BILIRUBINUR Negative 12/31/2013 2116   [place] NEGATIVE 03/06/2021 0702   PROTEINUR 30 (A) 03/06/2021 0702   UROBILINOGEN 1.0 08/28/2013 1709    NITRITE NEGATIVE 03/06/2021 0702   LEUKOCYTESUR MODERATE (A) 03/06/2021 0702   LEUKOCYTESUR Negative 12/31/2013 2116   Sepsis Labs: Invalid input(s): PROCALCITONIN, [place]  Microbiology: Recent Results (from the past 240 hour(s))  Resp Panel by RT-PCR (Flu A&B, Covid) Nasopharyngeal Swab     Status: None   Collection Time: 03/01/21  8:20 AM   Specimen: Nasopharyngeal Swab; Nasopharyngeal(NP) swabs in vial transport medium  Result Value Ref Range Status   SARS Coronavirus 2 by RT PCR NEGATIVE NEGATIVE Final    Comment: (NOTE) SARS-CoV-2 target nucleic acids are NOT DETECTED.  The SARS-CoV-2 RNA is generally detectable in upper respiratory specimens during the acute phase of infection. The lowest concentration of SARS-CoV-2 viral copies this assay can detect is 138 copies/mL. A negative result does not preclude SARS-Cov-2 infection and should not be used as the sole basis for treatment or other patient management decisions. A negative result may occur with  improper specimen collection/handling, submission of specimen other than nasopharyngeal swab, presence of viral mutation(s) within the areas targeted by this assay, and inadequate number of viral copies(<138 copies/mL). A negative result must be combined with clinical observations, patient history, and epidemiological information. The expected result is Negative.  Fact Sheet for Patients:  EntrepreneurPulse.com.au  Fact Sheet for Healthcare Providers:  IncredibleEmployment.be  This test is no t yet approved or cleared by the [place] FDA and  has been authorized for detection and/or diagnosis of SARS-CoV-2 by FDA under an Emergency Use Authorization (EUA). This EUA will remain  in effect (meaning this test can be used) for the duration of the COVID-19 declaration under Section 564(b)(1) of the Act, 21 U.S.C.section 360bbb-3(b)(1), unless the authorization  is terminated  or  revoked sooner.       Influenza A by PCR NEGATIVE NEGATIVE Final   Influenza B by PCR NEGATIVE NEGATIVE Final    Comment: (NOTE) The Xpert Xpress SARS-CoV-2/FLU/RSV plus assay is intended as an aid in the diagnosis of influenza from Nasopharyngeal swab specimens and should not be used as a sole basis for treatment. Nasal washings and aspirates are unacceptable for Xpert Xpress SARS-CoV-2/FLU/RSV testing.  Fact Sheet for Patients: EntrepreneurPulse.com.au  Fact Sheet for Healthcare Providers: IncredibleEmployment.be  This test is not yet approved or cleared by the [place] FDA and has been authorized for detection and/or diagnosis of SARS-CoV-2 by FDA under an Emergency Use Authorization (EUA). This EUA will remain in effect (meaning this test can be used) for the duration of the COVID-19 declaration under Section 564(b)(1) of the Act, 21 U.S.C. section 360bbb-3(b)(1), unless the authorization is terminated or revoked.  Performed at [facility], [place] [address], [place], [place] [address]   Aerobic/Anaerobic Culture w Gram Stain (surgical/deep wound)     Status: None   Collection Time: 03/01/21  1:05 PM   Specimen: Foot, Left; Wound  Result Value Ref Range Status   Specimen Description WOUND  Final   Special Requests LEFT FOOT SPEC A  Final   Gram Stain NO WBC SEEN NO ORGANISMS SEEN   Final   Culture   Final    FEW PROTEUS MIRABILIS NO ANAEROBES ISOLATED Performed at [facility], [address], [place], [place] [address]    Report Status 03/06/2021 FINAL  Final   Organism ID, Bacteria PROTEUS MIRABILIS  Final      Susceptibility   Proteus mirabilis - MIC*    AMPICILLIN >=32 RESISTANT Resistant     CEFAZOLIN >=64 RESISTANT Resistant     CEFEPIME <=0.12 SENSITIVE Sensitive     CEFTAZIDIME 2 SENSITIVE Sensitive     CEFTRIAXONE 1 SENSITIVE Sensitive     CIPROFLOXACIN <=0.25 SENSITIVE Sensitive     GENTAMICIN <=1  SENSITIVE Sensitive     IMIPENEM 2 SENSITIVE Sensitive     TRIMETH/SULFA <=20 SENSITIVE Sensitive     AMPICILLIN/SULBACTAM >=32 RESISTANT Resistant     PIP/TAZO <=4 SENSITIVE Sensitive     * FEW PROTEUS MIRABILIS  Blood Cultures x 2 sites     Status: None   Collection Time: 03/01/21  3:45 PM   Specimen: BLOOD LEFT HAND  Result Value Ref Range Status   Specimen Description BLOOD LEFT HAND  Final   Special Requests   Final    BOTTLES DRAWN AEROBIC AND ANAEROBIC Blood Culture adequate volume   Culture   Final    NO GROWTH 5 DAYS Performed at [facility], [address] [place], [place] [address]    Report Status 03/06/2021 FINAL  Final  Blood Cultures x 2 sites     Status: None   Collection Time: 03/01/21  3:53 PM   Specimen: BLOOD RIGHT ARM  Result Value Ref Range Status   Specimen Description BLOOD RIGHT ARM  Final   Special Requests   Final    BOTTLES DRAWN AEROBIC AND ANAEROBIC Blood Culture results may not be optimal due to an excessive volume of blood received in culture bottles   Culture   Final    NO GROWTH 5 DAYS Performed at [facility], [place] [address] [place], [place] [address]    Report Status 03/06/2021 FINAL  Final    Radiology Studies: No  results found.    Orren Pietsch T. Eads  If 7PM-7AM, please contact night-coverage [email or web address] 03/06/2021, 2:44 PM

## 2021-03-06 NOTE — Progress Notes (Signed)
Patient has been incont. Of 3 liq stools tonight and has not voideb since 8 p.m. bladder scan 942. Ivin Booty R.N. aware Dr. Bridgett Larsson text paged

## 2021-03-06 NOTE — Progress Notes (Signed)
Plan below knee amputation tomorrow in OR with me. NPO after midnight. Orders for consent form written.  [name]. Stanford Breed, MD [facility] Phone Number: [phone] 03/06/2021 1:11 PM

## 2021-03-07 ENCOUNTER — Encounter (HOSPITAL_COMMUNITY)
Admission: EM | Disposition: A | Discharge status: Skilled Nursing Facility | Payer: Self-pay | Source: Skilled Nursing Facility | Attending: Internal Medicine

## 2021-03-07 ENCOUNTER — Encounter (HOSPITAL_COMMUNITY): Payer: Self-pay | Admitting: Internal Medicine

## 2021-03-07 ENCOUNTER — Inpatient Hospital Stay (HOSPITAL_COMMUNITY): Payer: Medicare HMO | Admitting: Anesthesiology

## 2021-03-07 DIAGNOSIS — I10 Essential (primary) hypertension: Secondary | ICD-10-CM | POA: Diagnosis not present

## 2021-03-07 DIAGNOSIS — E11628 Type 2 diabetes mellitus with other skin complications: Secondary | ICD-10-CM | POA: Diagnosis not present

## 2021-03-07 DIAGNOSIS — N179 Acute kidney failure, unspecified: Secondary | ICD-10-CM | POA: Diagnosis not present

## 2021-03-07 DIAGNOSIS — I5032 Chronic diastolic (congestive) heart failure: Secondary | ICD-10-CM | POA: Diagnosis not present

## 2021-03-07 HISTORY — PX: APPLICATION OF WOUND VAC: SHX5189

## 2021-03-07 HISTORY — PX: AMPUTATION: SHX166

## 2021-03-07 LAB — BPAM RBC
Blood Product Expiration Date: 202302012359
Blood Product Expiration Date: 202302022359
ISSUE DATE / TIME: 202301071507
Unit Type and Rh: 5100
Unit Type and Rh: 5100

## 2021-03-07 LAB — TYPE AND SCREEN
ABO/RH(D): O POS
Antibody Screen: POSITIVE
DAT, IgG: NEGATIVE
Unit division: 0
Unit division: 0

## 2021-03-07 LAB — CBC WITH DIFFERENTIAL/PLATELET
Abs Immature Granulocytes: 0.12 10*3/uL — ABNORMAL HIGH (ref 0.00–0.07)
Basophils Absolute: 0 10*3/uL (ref 0.0–0.1)
Basophils Relative: 0 %
Eosinophils Absolute: 0.3 10*3/uL (ref 0.0–0.5)
Eosinophils Relative: 4 %
HCT: 26.9 % — ABNORMAL LOW (ref 39.0–52.0)
Hemoglobin: 8.9 g/dL — ABNORMAL LOW (ref 13.0–17.0)
Immature Granulocytes: 2 %
Lymphocytes Relative: 23 %
Lymphs Abs: 1.8 10*3/uL (ref 0.7–4.0)
MCH: 24.5 pg — ABNORMAL LOW (ref 26.0–34.0)
MCHC: 33.1 g/dL (ref 30.0–36.0)
MCV: 73.9 fL — ABNORMAL LOW (ref 80.0–100.0)
Monocytes Absolute: 0.7 10*3/uL (ref 0.1–1.0)
Monocytes Relative: 9 %
Neutro Abs: 5 10*3/uL (ref 1.7–7.7)
Neutrophils Relative %: 62 %
Platelets: 314 10*3/uL (ref 150–400)
RBC: 3.64 MIL/uL — ABNORMAL LOW (ref 4.22–5.81)
RDW: 15.4 % (ref 11.5–15.5)
WBC: 8 10*3/uL (ref 4.0–10.5)
nRBC: 0 % (ref 0.0–0.2)

## 2021-03-07 LAB — GLUCOSE, CAPILLARY
Glucose-Capillary: 168 mg/dL — ABNORMAL HIGH (ref 70–99)
Glucose-Capillary: 161 mg/dL — ABNORMAL HIGH (ref 70–99)
Glucose-Capillary: 120 mg/dL — ABNORMAL HIGH (ref 70–99)
Glucose-Capillary: 120 mg/dL — ABNORMAL HIGH (ref 70–99)
Glucose-Capillary: 151 mg/dL — ABNORMAL HIGH (ref 70–99)

## 2021-03-07 LAB — BASIC METABOLIC PANEL
Anion gap: 11 (ref 5–15)
BUN: 74 mg/dL — ABNORMAL HIGH (ref 6–20)
CO2: 28 mmol/L (ref 22–32)
Calcium: 7.4 mg/dL — ABNORMAL LOW (ref 8.9–10.3)
Chloride: 91 mmol/L — ABNORMAL LOW (ref 98–111)
Creatinine, Ser: 1.69 mg/dL — ABNORMAL HIGH (ref 0.61–1.24)
GFR, Estimated: 46 mL/min — ABNORMAL LOW (ref >60)
Glucose, Bld: 154 mg/dL — ABNORMAL HIGH (ref 70–99)
Potassium: 3.1 mmol/L — ABNORMAL LOW (ref 3.5–5.1)
Sodium: 130 mmol/L — ABNORMAL LOW (ref 135–145)

## 2021-03-07 LAB — MAGNESIUM: Magnesium: 1.8 mg/dL (ref 1.7–2.4)

## 2021-03-07 SURGERY — AMPUTATION BELOW KNEE
Anesthesia: General | Site: Leg Lower | Laterality: Left

## 2021-03-07 MED ORDER — SUGAMMADEX SODIUM 200 MG/2ML IV SOLN
INTRAVENOUS | Status: DC | PRN
  Administered 2021-03-07: 200 mg via INTRAVENOUS

## 2021-03-07 MED ORDER — MIDAZOLAM HCL 2 MG/2ML IJ SOLN
INTRAMUSCULAR | Status: AC
  Filled 2021-03-07: qty 2

## 2021-03-07 MED ORDER — ORAL CARE MOUTH RINSE: 15.0000 mL | Freq: Once | OROMUCOSAL | Status: DC

## 2021-03-07 MED ORDER — PROPOFOL 10 MG/ML IV BOLUS
INTRAVENOUS | Status: DC | PRN
Start: 2021-03-07 — End: 2021-03-07
  Administered 2021-03-07: 140 mg via INTRAVENOUS

## 2021-03-07 MED ORDER — SODIUM CHLORIDE 0.9 % IV SOLN: 2.0000 g | INTRAVENOUS | Status: DC

## 2021-03-07 MED ORDER — FENTANYL CITRATE (PF) 250 MCG/5ML IJ SOLN
INTRAMUSCULAR | Status: DC | PRN
  Administered 2021-03-07: 50 ug via INTRAVENOUS
  Administered 2021-03-07: 100 ug via INTRAVENOUS
  Administered 2021-03-07: 25 ug via INTRAVENOUS
  Administered 2021-03-07: 75 ug via INTRAVENOUS

## 2021-03-07 MED ORDER — SODIUM CHLORIDE 0.9 % IV SOLN: INTRAVENOUS | Status: DC

## 2021-03-07 MED ORDER — PHENYLEPHRINE HCL-NACL 20-0.9 MG/250ML-% IV SOLN
INTRAVENOUS | Status: DC | PRN
  Administered 2021-03-07: 20 ug/min via INTRAVENOUS

## 2021-03-07 MED ORDER — ROCURONIUM BROMIDE 10 MG/ML (PF) SYRINGE
PREFILLED_SYRINGE | INTRAVENOUS | Status: DC | PRN
Start: 2021-03-07 — End: 2021-03-07
  Administered 2021-03-07: 30 mg via INTRAVENOUS
  Administered 2021-03-07: 40 mg via INTRAVENOUS

## 2021-03-07 MED ORDER — LIDOCAINE 2% (20 MG/ML) 5 ML SYRINGE
INTRAMUSCULAR | Status: DC | PRN
  Administered 2021-03-07: 100 mg via INTRAVENOUS

## 2021-03-07 MED ORDER — CHLORHEXIDINE GLUCONATE 0.12 % MT SOLN: 15.0000 mL | Freq: Once | OROMUCOSAL | Status: DC

## 2021-03-07 MED ORDER — FENTANYL CITRATE (PF) 100 MCG/2ML IJ SOLN
INTRAMUSCULAR | Status: AC
  Filled 2021-03-07: qty 2

## 2021-03-07 MED ORDER — 0.9 % SODIUM CHLORIDE (POUR BTL) OPTIME
TOPICAL | Status: DC | PRN
  Administered 2021-03-07: 1000 mL

## 2021-03-07 MED ORDER — POTASSIUM CHLORIDE IN NACL 40-0.9 MEQ/L-% IV SOLN
INTRAVENOUS | Status: DC
  Filled 2021-03-07 (×4): qty 1000

## 2021-03-07 MED ORDER — SODIUM CHLORIDE 0.9 % IV SOLN
2.0000 g | INTRAVENOUS | Status: DC
  Administered 2021-03-07 – 2021-03-08 (×2): 2 g via INTRAVENOUS
  Filled 2021-03-07 (×3): qty 20

## 2021-03-07 MED ORDER — ONDANSETRON HCL 4 MG/2ML IJ SOLN
INTRAMUSCULAR | Status: DC | PRN
  Administered 2021-03-07: 4 mg via INTRAVENOUS

## 2021-03-07 MED ORDER — FENTANYL CITRATE (PF) 250 MCG/5ML IJ SOLN
INTRAMUSCULAR | Status: AC
  Filled 2021-03-07: qty 5

## 2021-03-07 MED ORDER — CHLORHEXIDINE GLUCONATE 0.12 % MT SOLN
OROMUCOSAL | Status: AC
  Filled 2021-03-07: qty 15

## 2021-03-07 MED ORDER — BACITRACIN ZINC 500 UNIT/GM EX OINT
TOPICAL_OINTMENT | CUTANEOUS | Status: AC
  Filled 2021-03-07: qty 28.35

## 2021-03-07 MED ORDER — FENTANYL CITRATE (PF) 100 MCG/2ML IJ SOLN
25.0000 ug | INTRAMUSCULAR | Status: DC | PRN
  Administered 2021-03-07 (×2): 25 ug via INTRAVENOUS

## 2021-03-07 MED ORDER — PHENYLEPHRINE 40 MCG/ML (10ML) SYRINGE FOR IV PUSH (FOR BLOOD PRESSURE SUPPORT)
PREFILLED_SYRINGE | INTRAVENOUS | Status: DC | PRN
Start: 2021-03-07 — End: 2021-03-07
  Administered 2021-03-07 (×2): 40 ug via INTRAVENOUS
  Administered 2021-03-07 (×2): 80 ug via INTRAVENOUS

## 2021-03-07 MED ORDER — ONDANSETRON HCL 4 MG/2ML IJ SOLN
INTRAMUSCULAR | Status: AC
  Filled 2021-03-07: qty 2

## 2021-03-07 SURGICAL SUPPLY — 71 items
APL PRP STRL LF DISP 70% ISPRP (MISCELLANEOUS) ×2
BAG COUNTER SPONGE SURGICOUNT (BAG) ×3 IMPLANT
BAG SPNG CNTER NS LX DISP (BAG) ×2
BANDAGE ESMARK 6X9 LF (GAUZE/BANDAGES/DRESSINGS) IMPLANT
BLADE LONG MED 31X9 (MISCELLANEOUS) IMPLANT
BLADE SAGITTAL (BLADE)
BLADE SAGITTAL 25.0X1.19X90 (BLADE) ×1 IMPLANT
BLADE SAW GIGLI 510 (BLADE) ×2 IMPLANT
BLADE SAW THK.89X75X18XSGTL (BLADE) IMPLANT
BLADE SURG 21 STRL SS (BLADE) ×3 IMPLANT
BNDG CMPR 9X6 STRL LF SNTH (GAUZE/BANDAGES/DRESSINGS) ×2
BNDG COHESIVE 6X5 TAN STRL LF (GAUZE/BANDAGES/DRESSINGS) ×2 IMPLANT
BNDG ELASTIC 4X5.8 VLCR STR LF (GAUZE/BANDAGES/DRESSINGS) IMPLANT
BNDG ELASTIC 6X5.8 VLCR STR LF (GAUZE/BANDAGES/DRESSINGS) IMPLANT
BNDG ESMARK 6X9 LF (GAUZE/BANDAGES/DRESSINGS) ×3
BNDG GAUZE ELAST 4 BULKY (GAUZE/BANDAGES/DRESSINGS) IMPLANT
CANISTER SUCT 3000ML PPV (MISCELLANEOUS) ×3 IMPLANT
CANISTER WOUNDNEG PRESSURE 500 (CANNISTER) ×1 IMPLANT
CHLORAPREP W/TINT 26 (MISCELLANEOUS) ×3 IMPLANT
CLIP LIGATING EXTRA MED SLVR (CLIP) ×1 IMPLANT
COVER SURGICAL LIGHT HANDLE (MISCELLANEOUS) ×3 IMPLANT
DRAIN CHANNEL 19F RND (DRAIN) IMPLANT
DRAPE DERMATAC (DRAPES) ×1 IMPLANT
DRAPE HALF SHEET 40X57 (DRAPES) ×3 IMPLANT
DRAPE INCISE IOBAN 66X45 STRL (DRAPES) ×1 IMPLANT
DRAPE INCISE IOBAN 85X60 (DRAPES) ×1 IMPLANT
DRAPE ORTHO SPLIT 77X108 STRL (DRAPES) ×6
DRAPE SURG ORHT 6 SPLT 77X108 (DRAPES) ×4 IMPLANT
DRAPE U-SHAPE 47X51 STRL (DRAPES) ×1 IMPLANT
DRESSING PREVENA PLUS CUSTOM (GAUZE/BANDAGES/DRESSINGS) IMPLANT
DRSG PREVENA PLUS CUSTOM (GAUZE/BANDAGES/DRESSINGS)
DRSG VAC ATS MED SENSATRAC (GAUZE/BANDAGES/DRESSINGS) ×1 IMPLANT
ELECT REM PT RETURN 9FT ADLT (ELECTROSURGICAL) ×3
ELECTRODE REM PT RTRN 9FT ADLT (ELECTROSURGICAL) ×2 IMPLANT
EVACUATOR SILICONE 100CC (DRAIN) IMPLANT
GAUZE SPONGE 4X4 12PLY STRL (GAUZE/BANDAGES/DRESSINGS) ×3 IMPLANT
GAUZE XEROFORM 5X9 LF (GAUZE/BANDAGES/DRESSINGS) ×3 IMPLANT
GLOVE SURG POLYISO LF SZ8 (GLOVE) ×3 IMPLANT
GOWN STRL REUS W/ TWL LRG LVL3 (GOWN DISPOSABLE) ×4 IMPLANT
GOWN STRL REUS W/ TWL XL LVL3 (GOWN DISPOSABLE) ×2 IMPLANT
GOWN STRL REUS W/TWL LRG LVL3 (GOWN DISPOSABLE) ×6
GOWN STRL REUS W/TWL XL LVL3 (GOWN DISPOSABLE) ×3
KIT BASIN OR (CUSTOM PROCEDURE TRAY) ×3 IMPLANT
KIT TURNOVER KIT B (KITS) ×3 IMPLANT
NS IRRIG 1000ML POUR BTL (IV SOLUTION) ×3 IMPLANT
PACK GENERAL/GYN (CUSTOM PROCEDURE TRAY) ×3 IMPLANT
PAD ARMBOARD 7.5X6 YLW CONV (MISCELLANEOUS) ×6 IMPLANT
PENCIL SMOKE EVACUATOR (MISCELLANEOUS) ×3 IMPLANT
PREVENA RESTOR ARTHOFORM 46X30 (CANNISTER) IMPLANT
PREVENA RESTOR AXIOFORM 29X28 (GAUZE/BANDAGES/DRESSINGS) ×1 IMPLANT
STAPLER SKIN 35 REG (STAPLE) ×3 IMPLANT
STAPLER VISISTAT 35W (STAPLE) ×3 IMPLANT
STOCKINETTE IMPERVIOUS LG (DRAPES) ×3 IMPLANT
SUT BONE WAX W31G (SUTURE) IMPLANT
SUT ETHILON 2 0 PSLX (SUTURE) ×6 IMPLANT
SUT ETHILON 3 0 PS 1 (SUTURE) IMPLANT
SUT FIBERWIRE #2 38 REV NDL BL (SUTURE) ×3
SUT FIBERWIRE #5 38 CONV BLUE (SUTURE) ×3
SUT SILK 0 TIES 10X30 (SUTURE) ×3 IMPLANT
SUT SILK 2 0 (SUTURE) ×6
SUT SILK 2 0 SH CR/8 (SUTURE) ×4 IMPLANT
SUT SILK 2-0 18XBRD TIE 12 (SUTURE) ×2 IMPLANT
SUT SILK 3 0 (SUTURE) ×3
SUT SILK 3-0 18XBRD TIE 12 (SUTURE) IMPLANT
SUT VIC AB 0 CT1 18XCR BRD 8 (SUTURE) ×4 IMPLANT
SUT VIC AB 0 CT1 8-18 (SUTURE) ×6
SUTURE FIBERWR #5 38 CONV BLUE (SUTURE) IMPLANT
SUTURE FIBERWR#2 38 REV NDL BL (SUTURE) IMPLANT
TOWEL GREEN STERILE (TOWEL DISPOSABLE) ×6 IMPLANT
UNDERPAD 30X36 HEAVY ABSORB (UNDERPADS AND DIAPERS) ×3 IMPLANT
WATER STERILE IRR 1000ML POUR (IV SOLUTION) ×3 IMPLANT

## 2021-03-07 NOTE — Progress Notes (Signed)
Patient ID: [name], male   DOB: 1960-04-17, 61 y.o.   MRN: [id number]  PROGRESS NOTE    [name]  PQZ:[id number] DOB: 12-14-1960 DOA: 02/28/2021 PCP: Jodi Marble, MD   Brief Narrative:  61 year old M with past medical history of diabetes mellitus type 2, chronic diastolic CHF, CKD stage IIIa,  glaucoma, HTN, depression, bipolar disorder, blindness and debility/bedbound at baseline brought to ED with diabetic left foot wound infection.  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  Started on IV vancomycin, ceftriaxone and Flagyl.  Podiatry and vascular surgery consulted.  Patient underwent left TMA amputation by Dr. Jacqualyn Posey on 03/01/2021.  ABI with noncompressible RLE arteries and moderate LLE PAD.  Underwent LLE angiography with no visible pedal circulation to revascularize.  Vascular surgery planning for left BKA on 03/07/2021.  Palliative medicine consulted and following.  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 -ABI with noncompressible RLE arteries and moderate LLE PAD.  Underwent LLE angiography with no visible pedal circulation to revascularize.  Vascular surgery planning for left BKA on 03/07/2021.   -Initially on Rocephin, Zyvox and Flagyl.  Currently on Rocephin alone.  Will DC Rocephin 24 hours after BKA. -Continue aspirin and statin.  AKI on CKD stage IIIa Acute metabolic acidosis -Creatinine improving.  1.69 today.  Acidosis is resolved.  Switch bicarb drip to normal saline at 75 cc an hour.  Repeat a.m. labs.  Acute urinary retention -Status post Foley catheter placement on 03/05/2021.  Might need voiding trial in 5 to 7 days.  We will continue  Flomax  Visual hallucinations/history of bipolar disorder/depression -Unclear cause.  Continue home Zoloft.  Patient has been started on low-dose Seroquel at night.  Fall precautions.  Delirium precautions.  If symptoms do not improve, will need psychiatry evaluation  Chronic diastolic CHF -Currently euvolemic.  Demadex and Entresto on hold because of AKI.  BiDil discontinued due to hypotension.  Strict input and output.  Daily weights.  Diabetes mellitus type 2 uncontrolled with hyperglycemia -A1c 8.4.  Current DM long-acting insulin along with CBGs with SSI  Hypotension History of essential hypertension -Blood pressure currently on the lower side but stable.  Continue amlodipine, low-dose.  Entresto, Demadex and BiDil on hold.  Hyponatremia -Monitor.  IV fluids plan as above.  Hypokalemia -Replace.  Repeat a.m. labs  Anemia of chronic disease -Status post 1 unit packed red cell transfusion on 03/03/2021.  Transfuse if hemoglobin is less than 7.  Glucoma/blindness -Continue dorzolamide, brimonidine  Debility -Reportedly bedbound at baseline.  From SNF.  Goals of care -Remains full code.Overall prognosis is poor.  Palliative care following.  Obesity -Outpatient follow-up  DVT prophylaxis: Heparin subcutaneous Code Status: Full Family Communication: None at bedside Disposition Plan: Status is: Inpatient  Remains inpatient appropriate because: Of need for surgical intervention  Consultants: Podiatry/vascular surgery/palliative care  Procedures:  03/01/2021-left transmetatarsal amputation by Dr. Jacqualyn Posey 03/02/2021-LLE angiogram with no visible pedal circulation  Antimicrobials:  Anti-infectives (From admission, onward)    Start     Dose/Rate Route Frequency Ordered Stop   03/07/21 0830  cefTRIAXone (ROCEPHIN) 2 g in sodium chloride 0.9 % 100 mL IVPB  Status:  Discontinued        2  g 200 mL/hr over 30 Minutes Intravenous Every 24 hours 03/07/21 0744 03/07/21 0744    03/07/21 0830  cefTRIAXone (ROCEPHIN) 2 g in sodium chloride 0.9 % 100 mL IVPB        2 g 200 mL/hr over 30 Minutes Intravenous Every 24 hours 03/07/21 0744     03/02/21 1000  linezolid (ZYVOX) IVPB 600 mg  Status:  Discontinued        600 mg 300 mL/hr over 60 Minutes Intravenous Every 12 hours 03/02/21 0717 03/03/21 1218   03/01/21 1600  cefTRIAXone (ROCEPHIN) 2 g in sodium chloride 0.9 % 100 mL IVPB        2 g 200 mL/hr over 30 Minutes Intravenous Every 24 hours 03/01/21 1507 03/07/21 2159   03/01/21 1600  metroNIDAZOLE (FLAGYL) IVPB 500 mg  Status:  Discontinued        500 mg 100 mL/hr over 60 Minutes Intravenous Every 8 hours 03/01/21 1507 03/03/21 1218   03/01/21 1529  vancomycin variable dose per unstable renal function (pharmacist dosing)  Status:  Discontinued         Does not apply See admin instructions 03/01/21 1529 03/02/21 0717   03/01/21 0015  vancomycin (VANCOREADY) IVPB 2000 mg/400 mL        2,000 mg 200 mL/hr over 120 Minutes Intravenous  Once 03/01/21 0001 03/01/21 0725   03/01/21 0015  clindamycin (CLEOCIN) IVPB 900 mg        900 mg 100 mL/hr over 30 Minutes Intravenous  Once 03/01/21 0010 03/01/21 0104   03/01/21 0015  piperacillin-tazobactam (ZOSYN) IVPB 3.375 g        3.375 g 100 mL/hr over 30 Minutes Intravenous  Once 03/01/21 0012 03/01/21 0216        Subjective: Patient seen and examined in person.  Sleepy, wakes up slightly, poor historian.  No overnight fever, vomiting, seizures reported.  Nursing staff reports that patient has been very sleepy this morning.  Objective: Vitals:   03/06/21 2015 03/06/21 2334 03/07/21 0334 03/07/21 0832  BP: 134/78 110/78 106/78 125/77  Pulse: 72   82  Resp: 18 17 18 20   Temp: 97.7 F (36.5 C) 98.1 F (36.7 C) 98.5 F (36.9 C) 98 F (36.7 C)  TempSrc: Oral Oral Axillary Axillary  SpO2: 100% (!) 88% 94% 95%  Weight:      Height:        Intake/Output Summary (Last 24 hours) at 03/07/2021 1042 Last data filed at  03/07/2021 0830 Gross per 24 hour  Intake 2100 ml  Output 110 ml  Net 1990 ml   Filed Weights   03/03/21 0320 03/04/21 0500 03/06/21 0616  Weight: 91.7 kg 97.8 kg 97.8 kg    Examination:  General exam: Appears calm and comfortable.  Currently on room air.  Looks chronically ill Respiratory system: Bilateral decreased breath sounds at bases with some scattered crackles Cardiovascular system: S1 & S2 heard, Rate controlled Gastrointestinal system: Abdomen is nondistended, soft and nontender. Normal bowel sounds heard. Extremities: No cyanosis, clubbing; trace lower extreme edema present.  Left TMA with dressing present Central nervous system: Sleepy, wakes up slightly, very slow to respond.  No focal neurological deficits. Moving extremities Skin: No obvious ecchymosis/lesions  psychiatry: Extremely flat affect.  Does not participate in conversation much.   Data Reviewed: I have personally reviewed following labs and imaging studies  CBC: Recent Labs  Lab 02/28/21 2350 03/02/21 0242 03/02/21 1151 03/03/21 0818 03/03/21 2021 03/04/21 0730 03/05/21 2774 03/06/21 0353 03/07/21  0809  WBC 12.0* 9.5  --  7.2  --  7.8  --   --  8.0  NEUTROABS 8.9*  --   --   --   --   --   --   --  5.0  HGB 8.0* 7.1*   < > 6.9* 8.2* 8.8* 8.9* 8.8* 8.9*  HCT 26.0* 22.8*   < > 22.8* 26.0* 27.5* 27.1* 27.6* 26.9*  MCV 75.4* 76.0*  --  75.5*  --  76.0*  --   --  73.9*  PLT 391 355  --  356  --  356  --   --  314   < > = values in this interval not displayed.   Basic Metabolic Panel: Recent Labs  Lab 03/03/21 0818 03/04/21 0730 03/05/21 0609 03/06/21 0353 03/07/21 0809  NA 134* 132* 133* 134* 130*  K 4.0 3.9 3.5 3.0* 3.1*  CL 107 105 100 98 91*  CO2 15* 14* 20* 22 28  GLUCOSE 176* 145* 140* 142* 154*  BUN 95* 94* 88* 82* 74*  CREATININE 2.91* 2.85* 2.38* 1.93* 1.69*  CALCIUM 8.2* 8.0* 7.7* 7.7* 7.4*  MG 2.2 2.0 1.8 1.9 1.8  PHOS 6.3* 6.1* 4.8* 4.6  --    GFR: Estimated Creatinine  Clearance: 53.4 mL/min (A) (by C-G formula based on SCr of 1.69 mg/dL (H)). Liver Function Tests: Recent Labs  Lab 02/28/21 2350 03/03/21 0818 03/04/21 0730 03/05/21 0609 03/06/21 0353  AST 11*  --   --   --   --   ALT 14  --   --   --   --   ALKPHOS 107  --   --   --   --   BILITOT 0.9  --   --   --   --   PROT 7.2  --   --   --   --   ALBUMIN 2.1* 2.0* 2.0* 1.9* 1.9*   No results for input(s): LIPASE, AMYLASE in the last 168 hours. No results for input(s): AMMONIA in the last 168 hours. Coagulation Profile: No results for input(s): INR, PROTIME in the last 168 hours. Cardiac Enzymes: No results for input(s): CKTOTAL, CKMB, CKMBINDEX, TROPONINI in the last 168 hours. BNP (last 3 results) No results for input(s): PROBNP in the last 8760 hours. HbA1C: No results for input(s): HGBA1C in the last 72 hours. CBG: Recent Labs  Lab 03/06/21 0614 03/06/21 1142 03/06/21 1634 03/06/21 2153 03/07/21 0627  GLUCAP 143* 187* 181* 160* 168*   Lipid Profile: No results for input(s): CHOL, HDL, LDLCALC, TRIG, CHOLHDL, LDLDIRECT in the last 72 hours. Thyroid Function Tests: No results for input(s): TSH, T4TOTAL, FREET4, T3FREE, THYROIDAB in the last 72 hours. Anemia Panel: No results for input(s): VITAMINB12, FOLATE, FERRITIN, TIBC, IRON, RETICCTPCT in the last 72 hours. Sepsis Labs: Recent Labs  Lab 03/01/21 0021  LATICACIDVEN 0.6    Recent Results (from the past 240 hour(s))  Resp Panel by RT-PCR (Flu A&B, Covid) Nasopharyngeal Swab     Status: None   Collection Time: 03/01/21  8:20 AM   Specimen: Nasopharyngeal Swab; Nasopharyngeal(NP) swabs in vial transport medium  Result Value Ref Range Status   SARS Coronavirus 2 by RT PCR NEGATIVE NEGATIVE Final    Comment: (NOTE) SARS-CoV-2 target nucleic acids are NOT DETECTED.  The SARS-CoV-2 RNA is generally detectable in upper respiratory specimens during the acute phase of infection. The lowest concentration of SARS-CoV-2 viral  copies this assay can detect is 138 copies/mL.  A negative result does not preclude SARS-Cov-2 infection and should not be used as the sole basis for treatment or other patient management decisions. A negative result may occur with  improper specimen collection/handling, submission of specimen other than nasopharyngeal swab, presence of viral mutation(s) within the areas targeted by this assay, and inadequate number of viral copies(<138 copies/mL). A negative result must be combined with clinical observations, patient history, and epidemiological information. The expected result is Negative.  Fact Sheet for Patients:  EntrepreneurPulse.com.au  Fact Sheet for Healthcare Providers:  IncredibleEmployment.be  This test is no t yet approved or cleared by the [place] FDA and  has been authorized for detection and/or diagnosis of SARS-CoV-2 by FDA under an Emergency Use Authorization (EUA). This EUA will remain  in effect (meaning this test can be used) for the duration of the COVID-19 declaration under Section 564(b)(1) of the Act, 21 U.S.C.section 360bbb-3(b)(1), unless the authorization is terminated  or revoked sooner.       Influenza A by PCR NEGATIVE NEGATIVE Final   Influenza B by PCR NEGATIVE NEGATIVE Final    Comment: (NOTE) The Xpert Xpress SARS-CoV-2/FLU/RSV plus assay is intended as an aid in the diagnosis of influenza from Nasopharyngeal swab specimens and should not be used as a sole basis for treatment. Nasal washings and aspirates are unacceptable for Xpert Xpress SARS-CoV-2/FLU/RSV testing.  Fact Sheet for Patients: EntrepreneurPulse.com.au  Fact Sheet for Healthcare Providers: IncredibleEmployment.be  This test is not yet approved or cleared by the [place] FDA and has been authorized for detection and/or diagnosis of SARS-CoV-2 by FDA under an Emergency Use Authorization (EUA). This  EUA will remain in effect (meaning this test can be used) for the duration of the COVID-19 declaration under Section 564(b)(1) of the Act, 21 U.S.C. section 360bbb-3(b)(1), unless the authorization is terminated or revoked.  Performed at [facility], [place] [address] [place], [place] [address]   Aerobic/Anaerobic Culture w Gram Stain (surgical/deep wound)     Status: None   Collection Time: 03/01/21  1:05 PM   Specimen: Foot, Left; Wound  Result Value Ref Range Status   Specimen Description WOUND  Final   Special Requests LEFT FOOT SPEC A  Final   Gram Stain NO WBC SEEN NO ORGANISMS SEEN   Final   Culture   Final    FEW PROTEUS MIRABILIS NO ANAEROBES ISOLATED Performed at [facility], [address], [place], [place] [address]    Report Status 03/06/2021 FINAL  Final   Organism ID, Bacteria PROTEUS MIRABILIS  Final      Susceptibility   Proteus mirabilis - MIC*    AMPICILLIN >=32 RESISTANT Resistant     CEFAZOLIN >=64 RESISTANT Resistant     CEFEPIME <=0.12 SENSITIVE Sensitive     CEFTAZIDIME 2 SENSITIVE Sensitive     CEFTRIAXONE 1 SENSITIVE Sensitive     CIPROFLOXACIN <=0.25 SENSITIVE Sensitive     GENTAMICIN <=1 SENSITIVE Sensitive     IMIPENEM 2 SENSITIVE Sensitive     TRIMETH/SULFA <=20 SENSITIVE Sensitive     AMPICILLIN/SULBACTAM >=32 RESISTANT Resistant     PIP/TAZO <=4 SENSITIVE Sensitive     * FEW PROTEUS MIRABILIS  Blood Cultures x 2 sites     Status: None   Collection Time: 03/01/21  3:45 PM   Specimen: BLOOD LEFT HAND  Result Value Ref Range Status   Specimen Description BLOOD LEFT HAND  Final   Special Requests   Final    BOTTLES  DRAWN AEROBIC AND ANAEROBIC Blood Culture adequate volume   Culture   Final    NO GROWTH 5 DAYS Performed at [facility], [place] [address], [place], [place] [address]    Report Status 03/06/2021 FINAL  Final  Blood Cultures x 2 sites     Status: None   Collection Time: 03/01/21  3:53 PM   Specimen: BLOOD  RIGHT ARM  Result Value Ref Range Status   Specimen Description BLOOD RIGHT ARM  Final   Special Requests   Final    BOTTLES DRAWN AEROBIC AND ANAEROBIC Blood Culture results may not be optimal due to an excessive volume of blood received in culture bottles   Culture   Final    NO GROWTH 5 DAYS Performed at [facility], [place] [address], [place], [place] [address]    Report Status 03/06/2021 FINAL  Final         Radiology Studies: No results found.      Scheduled Meds:  sodium chloride   Intravenous Once   amLODipine  5 mg Oral Daily   atorvastatin  80 mg Oral QHS   brimonidine  1 drop Both Eyes BID   Chlorhexidine Gluconate Cloth  6 each Topical Daily   dorzolamide  1 drop Both Eyes BID   heparin  5,000 Units Subcutaneous Q8H   insulin aspart  0-5 Units Subcutaneous QHS   insulin aspart  0-9 Units Subcutaneous TID WC   insulin glargine-yfgn  3 Units Subcutaneous Daily   multivitamin with minerals  1 tablet Oral Daily   pantoprazole  40 mg Oral Daily   pregabalin  75 mg Oral BID   QUEtiapine  25 mg Oral QHS   saccharomyces boulardii  250 mg Oral BID   senna-docusate  1 tablet Oral Daily   sertraline  50 mg Oral QHS   tamsulosin  0.8 mg Oral Daily   Continuous Infusions:  sodium chloride 10 mL/hr at 03/06/21 0220   cefTRIAXone (ROCEPHIN)  IV 2 g (03/06/21 2213)   cefTRIAXone (ROCEPHIN)  IV 2 g (03/07/21 0904)    sodium bicarbonate (isotonic) infusion in sterile water 125 mL/hr at 03/07/21 0701          Meilani Edmundson, MD [facility] 03/07/2021, 10:42 AM

## 2021-03-07 NOTE — Progress Notes (Signed)
[facility] [place] PROGRESS NOTE  ASSESSMENT / PLAN: RONEY YOUTZ is a 61 y.o. male with non-salvageable left lower extremity critical limb ischemia. Plan left below knee amputation today in OR.   SUBJECTIVE: Confused. Updated brother via telephone.  OBJECTIVE: BP (!) 141/94    Pulse 77    Temp 97.6 F (36.4 C) (Oral)    Resp 16    Ht 5\' 8"  (1.727 m)    Wt 97.8 kg    SpO2 97%    BMI 32.78 kg/m   Intake/Output Summary (Last 24 hours) at 03/07/2021 1514 Last data filed at 03/07/2021 1300 Gross per 24 hour  Intake 1620 ml  Output 360 ml  Net 1260 ml    In no acute distress.  Regular rate and rhythm Unlabored breathing Left foot unchanged.  CBC Latest Ref Rng & Units 03/07/2021 03/06/2021 03/05/2021  WBC 4.0 - 10.5 K/uL 8.0 - -  Hemoglobin 13.0 - 17.0 g/dL 8.9(L) 8.8(L) 8.9(L)  Hematocrit 39.0 - 52.0 % 26.9(L) 27.6(L) 27.1(L)  Platelets 150 - 400 K/uL 314 - -     CMP Latest Ref Rng & Units 03/07/2021 03/06/2021 03/05/2021  Glucose 70 - 99 mg/dL 154(H) 142(H) 140(H)  BUN 6 - 20 mg/dL 74(H) 82(H) 88(H)  Creatinine 0.61 - 1.24 mg/dL 1.69(H) 1.93(H) 2.38(H)  Sodium 135 - 145 mmol/L 130(L) 134(L) 133(L)  Potassium 3.5 - 5.1 mmol/L 3.1(L) 3.0(L) 3.5  Chloride 98 - 111 mmol/L 91(L) 98 100  CO2 22 - 32 mmol/L 28 22 20(L)  Calcium 8.9 - 10.3 mg/dL 7.4(L) 7.7(L) 7.7(L)  Total Protein 6.5 - 8.1 g/dL - - -  Total Bilirubin 0.3 - 1.2 mg/dL - - -  Alkaline Phos 38 - 126 U/L - - -  AST 15 - 41 U/L - - -  ALT 0 - 44 U/L - - -    Estimated Creatinine Clearance: 53.4 mL/min (A) (by C-G formula based on SCr of 1.69 mg/dL (H)).  [name]. Stanford Breed, MD [facility] Phone Number: [phone] 03/07/2021 3:14 PM

## 2021-03-07 NOTE — Op Note (Signed)
DATE OF SERVICE: 03/07/2021  PATIENT:  [name]  61 y.o. male  PRE-OPERATIVE DIAGNOSIS:  left lower extremity chronic limb threatening ischemia with no options for reconstruction  POST-OPERATIVE DIAGNOSIS:  Same  PROCEDURE:   left below knee amputation  SURGEON:  Surgeon(s) and Role:    * Cherre Robins, MD - Primary  ASSISTANT: Paulo Fruit, PA-C  An assistant was required to facilitate exposure and expedite the case.  ANESTHESIA:   general  EBL: 148mL  BLOOD ADMINISTERED:none  DRAINS: none   LOCAL MEDICATIONS USED:  NONE  SPECIMEN:  residual left leg  COUNTS: confirmed correct.  TOURNIQUET:    Total Tourniquet Time Documented: Thigh (Left) - 2 minutes Total: Thigh (Left) - 2 minutes  PATIENT DISPOSITION:  PACU - hemodynamically stable.   Delay start of Pharmacological VTE agent (>24hrs) due to surgical blood loss or risk of bleeding: no  INDICATION FOR PROCEDURE: [name] is a 61 y.o. male with CLTI with no options for reconstruction. After careful discussion of risks, benefits, and alternatives the patient was offered left below knee amputation. The patient's family understood and wished to proceed.  OPERATIVE FINDINGS: unremarkable below knee amputation.  DESCRIPTION OF PROCEDURE: After identification of the patient in the pre-operative holding area, the patient was transferred to the operating room. The patient was positioned supine on the operating room table. Anesthesia was induced. The left leg was prepped and draped in standard fashion. A surgical pause was performed confirming correct patient, procedure, and operative location.  A tourniquet was placed on the left thigh. The skin of the leg was marked to plan the anterior flap 10 cm distal to the tibial tuberosity. The anterior flap measured two thirds the circumference of the calf. The posterior flap was measured to be one third of the circumference of the calf in length.   The leg was  exsanguinated with an Esmarch tourniquet. The pneumatic tourniquet was inflated to 250 mm Hg. The flaps were created using pre-made marks using a 21 blade. The incision was carried down through subcutaneous tissue, fascia, and muscle anteriorly. The periosteal tissue was elevated anteriorly so that the tibia was about 3 cm shorter than the anterior skin flap.  The tibia was transected with a power saw. An anterior wedge was created with the power saw.  Then I smoothed out the rough edges with a rasp.  In a similar fashion, I cut back the fibula about two centimeters higher than the level of the tibia with an angled bone cutter.  The posterior flap was completed with an amputation knife.  The specimen was passed off the field. All visible arteries and veins were clamped and ligated using silk suture.  The tourniquet was deflated.  Hemostasis was achieved in the flaps using electrocautery and silk suture.  The stump was washed with sterile normal saline and no further active bleeding was noted.    A myodesis was created using a powered drill to create a small hole through the anterior cortex of the tibia.  A #5 FiberWire was delivered through the osteotomy and through the aponeurosis of the gastrocnemius.  I reapproximated the anterior and posterior fascia  with interrupted stitches of 2-0 Vicryl.  This was completed along the entire length of anterior and posterior fascia until there was no more loose space in the fascial line.  The skin was then reapproximated with staples.  The stump was washed off and dried.  A Prevena VAC system was applied to the  stump.  This was secured with derma tack and Ioban.  A good seal was achieved.  A stump shrinker was applied.  A stump protector was applied.  Upon completion of the case instrument and sharps counts were confirmed correct. The patient was transferred to the PACU in good condition. I was present for all portions of the procedure.  [name]. Roy Breed, MD [facility] Phone Number: [phone] 03/07/2021 4:43 PM

## 2021-03-07 NOTE — Anesthesia Postprocedure Evaluation (Signed)
Anesthesia Post Note  Patient: [name]  Procedure(s) Performed: LEFT BELOW KNEE AMPUTATION (Left: Knee) APPLICATION OF WOUND VAC (Left: Leg Lower)     Anesthesia Post Evaluation No notable events documented.  Last Vitals:  Vitals:   03/07/21 1750 03/07/21 1815  BP: 100/63 105/65  Pulse: 80 87  Resp: 12 11  Temp:  37.1 C  SpO2: 99% 99%    Last Pain:  Vitals:   03/07/21 1817  TempSrc:   PainSc: Asleep                 [name]

## 2021-03-07 NOTE — Anesthesia Procedure Notes (Signed)
Procedure Name: Intubation Date/Time: 03/07/2021 3:32 PM Performed by: Vonna Drafts, CRNA Pre-anesthesia Checklist: Patient identified, Emergency Drugs available, Suction available and Patient being monitored Patient Re-evaluated:Patient Re-evaluated prior to induction Oxygen Delivery Method: Circle system utilized Preoxygenation: Pre-oxygenation with 100% oxygen Induction Type: IV induction Ventilation: Mask ventilation without difficulty Laryngoscope Size: Mac and 4 Grade View: Grade I Tube type: Oral Number of attempts: 1 Airway Equipment and Method: Stylet and Oral airway Placement Confirmation: ETT inserted through vocal cords under direct vision, positive ETCO2 and breath sounds checked- equal and bilateral Secured at: 23 cm Tube secured with: Tape Dental Injury: Teeth and Oropharynx as per pre-operative assessment

## 2021-03-07 NOTE — Transfer of Care (Signed)
Immediate Anesthesia Transfer of Care Note  Patient: [name]  Procedure(s) Performed: LEFT BELOW KNEE AMPUTATION (Left: Knee) APPLICATION OF WOUND VAC (Left: Leg Lower)  Patient Location: PACU  Anesthesia Type:General  Level of Consciousness: drowsy  Airway & Oxygen Therapy: Patient Spontanous Breathing  Post-op Assessment: Report given to RN and Post -op Vital signs reviewed and stable  Post vital signs: Reviewed and stable  Last Vitals:  Vitals Value Taken Time  BP 90/63 03/07/21 1650  Temp    Pulse 80 03/07/21 1653  Resp 8 03/07/21 1653  SpO2 97 % 03/07/21 1653  Vitals shown include unvalidated device data.  Last Pain:  Vitals:   03/07/21 1505  TempSrc: Oral  PainSc: 5       Patients Stated Pain Goal: 0 (70/92/95 7473)  Complications: No notable events documented.

## 2021-03-07 NOTE — Progress Notes (Addendum)
Pt refused medications this am, and is unwilling to have surgery, currently talking to brother about situation. AAOx3 but very lethargic. Consent not signed but @bedside  ready.   Chrisandra Carota, RN 03/07/2021 9:20 AM    Pt is now willing for surgery and consent has been signed  Chrisandra Carota, RN 03/07/2021 9:36 AM

## 2021-03-07 NOTE — Anesthesia Preprocedure Evaluation (Addendum)
Anesthesia Evaluation  Patient identified by MRN, date of birth, ID band Patient awake    Airway Mallampati: II  TM Distance: >3 FB     Dental   Pulmonary former smoker,    breath sounds clear to auscultation       Cardiovascular hypertension, + CAD, + Peripheral Vascular Disease and +CHF   Rhythm:Regular Rate:Normal  History noted Dr. Nyoka Cowden   Neuro/Psych TIA Neuromuscular disease CVA    GI/Hepatic negative GI ROS, Neg liver ROS,   Endo/Other  diabetes  Renal/GU Renal disease     Musculoskeletal   Abdominal   Peds  Hematology   Anesthesia Other Findings   Reproductive/Obstetrics                            Anesthesia Physical Anesthesia Plan  ASA: 3  Anesthesia Plan: General   Post-op Pain Management:    Induction: Intravenous  PONV Risk Score and Plan: 2 and Ondansetron  Airway Management Planned: Oral ETT  Additional Equipment:   Intra-op Plan:   Post-operative Plan: Possible Post-op intubation/ventilation  Informed Consent: I have reviewed the patients History and Physical, chart, labs and discussed the procedure including the risks, benefits and alternatives for the proposed anesthesia with the patient or authorized representative who has indicated his/her understanding and acceptance.     Dental advisory given  Plan Discussed with: CRNA, Anesthesiologist and Surgeon  Anesthesia Plan Comments:        Anesthesia Quick Evaluation

## 2021-03-07 NOTE — Progress Notes (Signed)
Orthopedic Tech Progress Note Patient Details:  [name] Jun 29, 1960 [id number]  Patient ID: [name], male   DOB: 07-26-1960, 61 y.o.   MRN: [id number] Placed stat order with [facility] for skrinker.  Vernona Rieger 03/07/2021, 6:56 PM

## 2021-03-07 NOTE — Progress Notes (Signed)
Pt back from PACU, VSS, asleep, incisions clean dry and intact.   Chrisandra Carota, RN 03/07/2021 6:26 PM

## 2021-03-08 ENCOUNTER — Encounter (HOSPITAL_COMMUNITY): Payer: Self-pay | Admitting: Vascular Surgery

## 2021-03-08 DIAGNOSIS — E11628 Type 2 diabetes mellitus with other skin complications: Secondary | ICD-10-CM | POA: Diagnosis not present

## 2021-03-08 DIAGNOSIS — I5032 Chronic diastolic (congestive) heart failure: Secondary | ICD-10-CM | POA: Diagnosis not present

## 2021-03-08 DIAGNOSIS — I10 Essential (primary) hypertension: Secondary | ICD-10-CM | POA: Diagnosis not present

## 2021-03-08 DIAGNOSIS — N179 Acute kidney failure, unspecified: Secondary | ICD-10-CM | POA: Diagnosis not present

## 2021-03-08 LAB — CBC WITH DIFFERENTIAL/PLATELET
Abs Immature Granulocytes: 0.1 10*3/uL — ABNORMAL HIGH (ref 0.00–0.07)
Basophils Absolute: 0 10*3/uL (ref 0.0–0.1)
Basophils Relative: 0 %
Eosinophils Absolute: 0.4 10*3/uL (ref 0.0–0.5)
Eosinophils Relative: 4 %
HCT: 26.9 % — ABNORMAL LOW (ref 39.0–52.0)
Hemoglobin: 8.6 g/dL — ABNORMAL LOW (ref 13.0–17.0)
Immature Granulocytes: 1 %
Lymphocytes Relative: 16 %
Lymphs Abs: 1.5 10*3/uL (ref 0.7–4.0)
MCH: 24.1 pg — ABNORMAL LOW (ref 26.0–34.0)
MCHC: 32 g/dL (ref 30.0–36.0)
MCV: 75.4 fL — ABNORMAL LOW (ref 80.0–100.0)
Monocytes Absolute: 0.7 10*3/uL (ref 0.1–1.0)
Monocytes Relative: 8 %
Neutro Abs: 6.8 10*3/uL (ref 1.7–7.7)
Neutrophils Relative %: 71 %
Platelets: 322 10*3/uL (ref 150–400)
RBC: 3.57 MIL/uL — ABNORMAL LOW (ref 4.22–5.81)
RDW: 15.4 % (ref 11.5–15.5)
WBC: 9.6 10*3/uL (ref 4.0–10.5)
nRBC: 0 % (ref 0.0–0.2)

## 2021-03-08 LAB — BASIC METABOLIC PANEL
Anion gap: 11 (ref 5–15)
BUN: 65 mg/dL — ABNORMAL HIGH (ref 6–20)
CO2: 27 mmol/L (ref 22–32)
Calcium: 7.5 mg/dL — ABNORMAL LOW (ref 8.9–10.3)
Chloride: 98 mmol/L (ref 98–111)
Creatinine, Ser: 1.46 mg/dL — ABNORMAL HIGH (ref 0.61–1.24)
GFR, Estimated: 55 mL/min — ABNORMAL LOW (ref >60)
Glucose, Bld: 158 mg/dL — ABNORMAL HIGH (ref 70–99)
Potassium: 3.9 mmol/L (ref 3.5–5.1)
Sodium: 136 mmol/L (ref 135–145)

## 2021-03-08 LAB — URINE CULTURE: Culture: NO GROWTH

## 2021-03-08 LAB — GLUCOSE, CAPILLARY
Glucose-Capillary: 156 mg/dL — ABNORMAL HIGH (ref 70–99)
Glucose-Capillary: 169 mg/dL — ABNORMAL HIGH (ref 70–99)
Glucose-Capillary: 134 mg/dL — ABNORMAL HIGH (ref 70–99)
Glucose-Capillary: 123 mg/dL — ABNORMAL HIGH (ref 70–99)

## 2021-03-08 LAB — MAGNESIUM: Magnesium: 1.7 mg/dL (ref 1.7–2.4)

## 2021-03-08 NOTE — Care Management Important Message (Signed)
Important Message  Patient Details  Name: [name] MRN: [id number] Date of Birth: 1960-10-19   Medicare Important Message Given:  Yes     Shelda Altes 03/08/2021, 7:54 AM

## 2021-03-08 NOTE — Progress Notes (Signed)
Patient ID: [name], male   DOB: Jun 17, 1960, 61 y.o.   MRN: [id number]  PROGRESS NOTE    [name]  EYC:[id number] DOB: 11-24-1960 DOA: 02/28/2021 PCP: Jodi Marble, MD   Brief Narrative:  61 year old M with past medical history of diabetes mellitus type 2, chronic diastolic CHF, CKD stage IIIa,  glaucoma, HTN, depression, bipolar disorder, blindness and debility/bedbound at baseline brought to ED with diabetic left foot wound infection.  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  Started on IV vancomycin, ceftriaxone and Flagyl.  Podiatry and vascular surgery consulted.  Patient underwent left TMA amputation by Dr. Jacqualyn Posey on 03/01/2021.  ABI with noncompressible RLE arteries and moderate LLE PAD.  Underwent LLE angiography with no visible pedal circulation to revascularize.  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  Palliative medicine consulted and following.  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 -ABI with noncompressible RLE arteries and moderate LLE PAD.  Underwent LLE angiography with no visible pedal circulation to revascularize.   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  Wound care as per vascular surgery recommendations. -Initially on Rocephin, Zyvox and Flagyl.  Currently on Rocephin alone.  Will DC Rocephin today if okay with vascular surgery. -Continue aspirin and statin.  AKI on CKD stage IIIa Acute metabolic acidosis -Creatinine improving.  1.46 today.  Acidosis is resolved.  Decrease normal saline to 50 cc an hour.  Repeat a.m. labs.  Acute urinary retention -Status post Foley catheter placement on 03/05/2021.  Might need voiding trial in 5 to 7  days. C improved ontinue Flomax  Visual hallucinations/history of bipolar disorder/depression -Unclear cause.  Continue home Zoloft.  Patient has been started on low-dose Seroquel at night.  Fall precautions.  Delirium precautions.  If symptoms do not improve, will need psychiatry evaluation  Chronic diastolic CHF -Currently euvolemic.  Demadex and Entresto on hold because of AKI.  BiDil discontinued due to hypotension.  Strict input and output.  Daily weights.  Diabetes mellitus type 2 uncontrolled with hyperglycemia -A1c 8.4.  Current DM long-acting insulin along with CBGs with SSI  Hypotension History of essential hypertension -Blood pressure currently on the lower side but stable.  Continue amlodipine, low-dose.  Entresto, Demadex and BiDil on hold.  Hyponatremia -Improved.  IV fluids plan as above.  Hypokalemia -Improved.  Repeat a.m. labs  Anemia of chronic disease -Status post 1 unit packed red cell transfusion on 03/03/2021.  Transfuse if hemoglobin is less than 7.  Hemoglobin 8.6 today.  Glucoma/blindness -Continue dorzolamide, brimonidine  Debility -Reportedly bedbound at baseline.  From SNF.  Goals of care -Remains full code.Overall prognosis is poor.  Palliative care following.  Obesity -Outpatient follow-up  DVT prophylaxis: Heparin subcutaneous Code Status: Full Family Communication: None at bedside Disposition Plan: Status is: Inpatient  Remains inpatient appropriate because: Of need for SNF placement  Consultants: Podiatry/vascular surgery/palliative care  Procedures:  03/01/2021-left transmetatarsal amputation by Dr. Jacqualyn Posey 03/02/2021-LLE angiogram with no visible pedal circulation  Antimicrobials:  Anti-infectives (From admission, onward)    Start     Dose/Rate Route Frequency Ordered Stop   03/07/21 0830  cefTRIAXone (ROCEPHIN) 2 g in sodium chloride 0.9 % 100 mL IVPB  Status:  Discontinued  2 g 200 mL/hr over 30 Minutes Intravenous Every 24  hours 03/07/21 0744 03/07/21 0744   03/07/21 0830  cefTRIAXone (ROCEPHIN) 2 g in sodium chloride 0.9 % 100 mL IVPB        2 g 200 mL/hr over 30 Minutes Intravenous Every 24 hours 03/07/21 0744     03/02/21 1000  linezolid (ZYVOX) IVPB 600 mg  Status:  Discontinued        600 mg 300 mL/hr over 60 Minutes Intravenous Every 12 hours 03/02/21 0717 03/03/21 1218   03/01/21 1600  cefTRIAXone (ROCEPHIN) 2 g in sodium chloride 0.9 % 100 mL IVPB        2 g 200 mL/hr over 30 Minutes Intravenous Every 24 hours 03/01/21 1507 03/07/21 2159   03/01/21 1600  metroNIDAZOLE (FLAGYL) IVPB 500 mg  Status:  Discontinued        500 mg 100 mL/hr over 60 Minutes Intravenous Every 8 hours 03/01/21 1507 03/03/21 1218   03/01/21 1529  vancomycin variable dose per unstable renal function (pharmacist dosing)  Status:  Discontinued         Does not apply See admin instructions 03/01/21 1529 03/02/21 0717   03/01/21 0015  vancomycin (VANCOREADY) IVPB 2000 mg/400 mL        2,000 mg 200 mL/hr over 120 Minutes Intravenous  Once 03/01/21 0001 03/01/21 0725   03/01/21 0015  clindamycin (CLEOCIN) IVPB 900 mg        900 mg 100 mL/hr over 30 Minutes Intravenous  Once 03/01/21 0010 03/01/21 0104   03/01/21 0015  piperacillin-tazobactam (ZOSYN) IVPB 3.375 g        3.375 g 100 mL/hr over 30 Minutes Intravenous  Once 03/01/21 0012 03/01/21 0216        Subjective: Patient seen and examined at bedside.  Poor historian.  No fever, seizures, agitation or vomiting reported.  Objective: Vitals:   03/08/21 0044 03/08/21 0244 03/08/21 0444 03/08/21 0803  BP:   (!) 141/80 (!) 150/84  Pulse: 85 97 96 90  Resp:   18 19  Temp:   97.9 F (36.6 C) (!) 97.4 F (36.3 C)  TempSrc:   Oral Oral  SpO2: 100% 100% 100% 100%  Weight:      Height:        Intake/Output Summary (Last 24 hours) at 03/08/2021 0812 Last data filed at 03/08/2021 0804 Gross per 24 hour  Intake 2246.36 ml  Output 1890 ml  Net 356.36 ml    Filed Weights    03/03/21 0320 03/04/21 0500 03/06/21 0616  Weight: 91.7 kg 97.8 kg 97.8 kg    Examination:  General exam: On room air currently.  No distress.  Looks chronically ill and deconditioned Respiratory system: Decreased breath sounds at bases bilaterally with some crackles  cardiovascular system: Rate controlled; S1-S2 heard gastrointestinal system: Abdomen is distended slightly; soft and nontender.  Bowel sounds are heard Extremities: No cyanosis, clubbing; trace right lower extreme edema present.  Left BKA with dressing present  Central nervous system: Still very slow to respond.  No focal neurological deficits.  Moves extremities  skin: No obvious petechiae/rashes  psychiatry: Hardly participates in any conversation.  Affect is very flat.  Data Reviewed: I have personally reviewed following labs and imaging studies  CBC: Recent Labs  Lab 03/02/21 0242 03/02/21 1151 03/03/21 0818 03/03/21 2021 03/04/21 0730 03/05/21 0609 03/06/21 0353 03/07/21 0809 03/08/21 0115  WBC 9.5  --  7.2  --  7.8  --   --  8.0 9.6  NEUTROABS  --   --   --   --   --   --   --  5.0 6.8  HGB 7.1*   < > 6.9*   < > 8.8* 8.9* 8.8* 8.9* 8.6*  HCT 22.8*   < > 22.8*   < > 27.5* 27.1* 27.6* 26.9* 26.9*  MCV 76.0*  --  75.5*  --  76.0*  --   --  73.9* 75.4*  PLT 355  --  356  --  356  --   --  314 322   < > = values in this interval not displayed.    Basic Metabolic Panel: Recent Labs  Lab 03/03/21 0818 03/04/21 0730 03/05/21 0609 03/06/21 0353 03/07/21 0809 03/08/21 0115  NA 134* 132* 133* 134* 130* 136  K 4.0 3.9 3.5 3.0* 3.1* 3.9  CL 107 105 100 98 91* 98  CO2 15* 14* 20* 22 28 27   GLUCOSE 176* 145* 140* 142* 154* 158*  BUN 95* 94* 88* 82* 74* 65*  CREATININE 2.91* 2.85* 2.38* 1.93* 1.69* 1.46*  CALCIUM 8.2* 8.0* 7.7* 7.7* 7.4* 7.5*  MG 2.2 2.0 1.8 1.9 1.8 1.7  PHOS 6.3* 6.1* 4.8* 4.6  --   --     GFR: Estimated Creatinine Clearance: 61.8 mL/min (A) (by C-G formula based on SCr of 1.46 mg/dL  (H)). Liver Function Tests: Recent Labs  Lab 03/03/21 0818 03/04/21 0730 03/05/21 0609 03/06/21 0353  ALBUMIN 2.0* 2.0* 1.9* 1.9*    No results for input(s): LIPASE, AMYLASE in the last 168 hours. No results for input(s): AMMONIA in the last 168 hours. Coagulation Profile: No results for input(s): INR, PROTIME in the last 168 hours. Cardiac Enzymes: No results for input(s): CKTOTAL, CKMB, CKMBINDEX, TROPONINI in the last 168 hours. BNP (last 3 results) No results for input(s): PROBNP in the last 8760 hours. HbA1C: No results for input(s): HGBA1C in the last 72 hours. CBG: Recent Labs  Lab 03/07/21 1119 03/07/21 1425 03/07/21 1655 03/07/21 2135 03/08/21 0636  GLUCAP 161* 120* 120* 151* 156*    Lipid Profile: No results for input(s): CHOL, HDL, LDLCALC, TRIG, CHOLHDL, LDLDIRECT in the last 72 hours. Thyroid Function Tests: No results for input(s): TSH, T4TOTAL, FREET4, T3FREE, THYROIDAB in the last 72 hours. Anemia Panel: No results for input(s): VITAMINB12, FOLATE, FERRITIN, TIBC, IRON, RETICCTPCT in the last 72 hours. Sepsis Labs: No results for input(s): PROCALCITON, LATICACIDVEN in the last 168 hours.   Recent Results (from the past 240 hour(s))  Resp Panel by RT-PCR (Flu A&B, Covid) Nasopharyngeal Swab     Status: None   Collection Time: 03/01/21  8:20 AM   Specimen: Nasopharyngeal Swab; Nasopharyngeal(NP) swabs in vial transport medium  Result Value Ref Range Status   SARS Coronavirus 2 by RT PCR NEGATIVE NEGATIVE Final    Comment: (NOTE) SARS-CoV-2 target nucleic acids are NOT DETECTED.  The SARS-CoV-2 RNA is generally detectable in upper respiratory specimens during the acute phase of infection. The lowest concentration of SARS-CoV-2 viral copies this assay can detect is 138 copies/mL. A negative result does not preclude SARS-Cov-2 infection and should not be used as the sole basis for treatment or other patient management decisions. A negative result may  occur with  improper specimen collection/handling, submission of specimen other than nasopharyngeal swab, presence of viral mutation(s) within the areas targeted by this assay, and inadequate number of viral copies(<138 copies/mL). A negative result must be combined with clinical observations, patient history, and  epidemiological information. The expected result is Negative.  Fact Sheet for Patients:  EntrepreneurPulse.com.au  Fact Sheet for Healthcare Providers:  IncredibleEmployment.be  This test is no t yet approved or cleared by the [place] FDA and  has been authorized for detection and/or diagnosis of SARS-CoV-2 by FDA under an Emergency Use Authorization (EUA). This EUA will remain  in effect (meaning this test can be used) for the duration of the COVID-19 declaration under Section 564(b)(1) of the Act, 21 U.S.C.section 360bbb-3(b)(1), unless the authorization is terminated  or revoked sooner.       Influenza A by PCR NEGATIVE NEGATIVE Final   Influenza B by PCR NEGATIVE NEGATIVE Final    Comment: (NOTE) The Xpert Xpress SARS-CoV-2/FLU/RSV plus assay is intended as an aid in the diagnosis of influenza from Nasopharyngeal swab specimens and should not be used as a sole basis for treatment. Nasal washings and aspirates are unacceptable for Xpert Xpress SARS-CoV-2/FLU/RSV testing.  Fact Sheet for Patients: EntrepreneurPulse.com.au  Fact Sheet for Healthcare Providers: IncredibleEmployment.be  This test is not yet approved or cleared by the [place] FDA and has been authorized for detection and/or diagnosis of SARS-CoV-2 by FDA under an Emergency Use Authorization (EUA). This EUA will remain in effect (meaning this test can be used) for the duration of the COVID-19 declaration under Section 564(b)(1) of the Act, 21 U.S.C. section 360bbb-3(b)(1), unless the authorization is terminated  or revoked.  Performed at [facility], [place] [address], [place], [place] [address]   Aerobic/Anaerobic Culture w Gram Stain (surgical/deep wound)     Status: None   Collection Time: 03/01/21  1:05 PM   Specimen: Foot, Left; Wound  Result Value Ref Range Status   Specimen Description WOUND  Final   Special Requests LEFT FOOT SPEC A  Final   Gram Stain NO WBC SEEN NO ORGANISMS SEEN   Final   Culture   Final    FEW PROTEUS MIRABILIS NO ANAEROBES ISOLATED Performed at [facility], [address], [place], [place] [address]    Report Status 03/06/2021 FINAL  Final   Organism ID, Bacteria PROTEUS MIRABILIS  Final      Susceptibility   Proteus mirabilis - MIC*    AMPICILLIN >=32 RESISTANT Resistant     CEFAZOLIN >=64 RESISTANT Resistant     CEFEPIME <=0.12 SENSITIVE Sensitive     CEFTAZIDIME 2 SENSITIVE Sensitive     CEFTRIAXONE 1 SENSITIVE Sensitive     CIPROFLOXACIN <=0.25 SENSITIVE Sensitive     GENTAMICIN <=1 SENSITIVE Sensitive     IMIPENEM 2 SENSITIVE Sensitive     TRIMETH/SULFA <=20 SENSITIVE Sensitive     AMPICILLIN/SULBACTAM >=32 RESISTANT Resistant     PIP/TAZO <=4 SENSITIVE Sensitive     * FEW PROTEUS MIRABILIS  Blood Cultures x 2 sites     Status: None   Collection Time: 03/01/21  3:45 PM   Specimen: BLOOD LEFT HAND  Result Value Ref Range Status   Specimen Description BLOOD LEFT HAND  Final   Special Requests   Final    BOTTLES DRAWN AEROBIC AND ANAEROBIC Blood Culture adequate volume   Culture   Final    NO GROWTH 5 DAYS Performed at [facility], [address] [place], [place] [address]    Report Status 03/06/2021 FINAL  Final  Blood Cultures x 2 sites     Status: None   Collection Time: 03/01/21  3:53 PM   Specimen: BLOOD RIGHT ARM  Result  Value Ref Range Status   Specimen Description BLOOD RIGHT ARM  Final   Special Requests   Final    BOTTLES DRAWN AEROBIC AND ANAEROBIC Blood Culture results may not be optimal due to an excessive  volume of blood received in culture bottles   Culture   Final    NO GROWTH 5 DAYS Performed at [facility] [address], [place], [place] [address]    Report Status 03/06/2021 FINAL  Final          Radiology Studies: No results found.      Scheduled Meds:  sodium chloride   Intravenous Once   amLODipine  5 mg Oral Daily   atorvastatin  80 mg Oral QHS   brimonidine  1 drop Both Eyes BID   Chlorhexidine Gluconate Cloth  6 each Topical Daily   dorzolamide  1 drop Both Eyes BID   heparin  5,000 Units Subcutaneous Q8H   insulin aspart  0-5 Units Subcutaneous QHS   insulin aspart  0-9 Units Subcutaneous TID WC   insulin glargine-yfgn  3 Units Subcutaneous Daily   multivitamin with minerals  1 tablet Oral Daily   pantoprazole  40 mg Oral Daily   pregabalin  75 mg Oral BID   QUEtiapine  25 mg Oral QHS   saccharomyces boulardii  250 mg Oral BID   senna-docusate  1 tablet Oral Daily   sertraline  50 mg Oral QHS   tamsulosin  0.8 mg Oral Daily   Continuous Infusions:  0.9 % NaCl with KCl 40 mEq / L 75 mL/hr at 03/08/21 0636   cefTRIAXone (ROCEPHIN)  IV Stopped (03/07/21 6815)          Aline August, MD [facility] 03/08/2021, 8:12 AM

## 2021-03-08 NOTE — Progress Notes (Signed)
[facility] [place] PROGRESS NOTE  ASSESSMENT / PLAN: [name] is a 61 y.o. male status post left below knee amputation. Pain seems to be well controlled, but difficult to assess given delirium. Mobilize as able with PT / OT / OOB. Will need SNF at discharge.    SUBJECTIVE: Confused. Reviewed details of surgery yesterday.  OBJECTIVE: BP (!) 150/84 (BP Location: Right Arm)    Pulse 90    Temp (!) 97.4 F (36.3 C) (Oral)    Resp 19    Ht 5\' 8"  (1.727 m)    Wt 97.8 kg    SpO2 100%    BMI 32.78 kg/m   Intake/Output Summary (Last 24 hours) at 03/08/2021 0848 Last data filed at 03/08/2021 0804 Gross per 24 hour  Intake 2246.36 ml  Output 1890 ml  Net 356.36 ml    No acute distress VAC to amputation site with good seal. Stump protector in place.  CBC Latest Ref Rng & Units 03/08/2021 03/07/2021 03/06/2021  WBC 4.0 - 10.5 K/uL 9.6 8.0 -  Hemoglobin 13.0 - 17.0 g/dL 8.6(L) 8.9(L) 8.8(L)  Hematocrit 39.0 - 52.0 % 26.9(L) 26.9(L) 27.6(L)  Platelets 150 - 400 K/uL 322 314 -     CMP Latest Ref Rng & Units 03/08/2021 03/07/2021 03/06/2021  Glucose 70 - 99 mg/dL 158(H) 154(H) 142(H)  BUN 6 - 20 mg/dL 65(H) 74(H) 82(H)  Creatinine 0.61 - 1.24 mg/dL 1.46(H) 1.69(H) 1.93(H)  Sodium 135 - 145 mmol/L 136 130(L) 134(L)  Potassium 3.5 - 5.1 mmol/L 3.9 3.1(L) 3.0(L)  Chloride 98 - 111 mmol/L 98 91(L) 98  CO2 22 - 32 mmol/L 27 28 22   Calcium 8.9 - 10.3 mg/dL 7.5(L) 7.4(L) 7.7(L)  Total Protein 6.5 - 8.1 g/dL - - -  Total Bilirubin 0.3 - 1.2 mg/dL - - -  Alkaline Phos 38 - 126 U/L - - -  AST 15 - 41 U/L - - -  ALT 0 - 44 U/L - - -    Estimated Creatinine Clearance: 61.8 mL/min (A) (by C-G formula based on SCr of 1.46 mg/dL (H)).   [name]. Stanford Breed, MD [facility] Phone Number: [phone] 03/08/2021 8:48 AM

## 2021-03-09 DIAGNOSIS — N179 Acute kidney failure, unspecified: Secondary | ICD-10-CM | POA: Diagnosis not present

## 2021-03-09 DIAGNOSIS — I10 Essential (primary) hypertension: Secondary | ICD-10-CM | POA: Diagnosis not present

## 2021-03-09 DIAGNOSIS — I5032 Chronic diastolic (congestive) heart failure: Secondary | ICD-10-CM | POA: Diagnosis not present

## 2021-03-09 DIAGNOSIS — Z89512 Acquired absence of left leg below knee: Secondary | ICD-10-CM

## 2021-03-09 DIAGNOSIS — E11628 Type 2 diabetes mellitus with other skin complications: Secondary | ICD-10-CM | POA: Diagnosis not present

## 2021-03-09 LAB — CBC WITH DIFFERENTIAL/PLATELET
Abs Immature Granulocytes: 0.12 10*3/uL — ABNORMAL HIGH (ref 0.00–0.07)
Basophils Absolute: 0 10*3/uL (ref 0.0–0.1)
Basophils Relative: 0 %
Eosinophils Absolute: 0.4 10*3/uL (ref 0.0–0.5)
Eosinophils Relative: 3 %
HCT: 27.5 % — ABNORMAL LOW (ref 39.0–52.0)
Hemoglobin: 8.4 g/dL — ABNORMAL LOW (ref 13.0–17.0)
Immature Granulocytes: 1 %
Lymphocytes Relative: 16 %
Lymphs Abs: 1.7 10*3/uL (ref 0.7–4.0)
MCH: 23.7 pg — ABNORMAL LOW (ref 26.0–34.0)
MCHC: 30.5 g/dL (ref 30.0–36.0)
MCV: 77.5 fL — ABNORMAL LOW (ref 80.0–100.0)
Monocytes Absolute: 0.8 10*3/uL (ref 0.1–1.0)
Monocytes Relative: 8 %
Neutro Abs: 7.5 10*3/uL (ref 1.7–7.7)
Neutrophils Relative %: 72 %
Platelets: 336 10*3/uL (ref 150–400)
RBC: 3.55 MIL/uL — ABNORMAL LOW (ref 4.22–5.81)
RDW: 15.7 % — ABNORMAL HIGH (ref 11.5–15.5)
WBC: 10.5 10*3/uL (ref 4.0–10.5)
nRBC: 0 % (ref 0.0–0.2)

## 2021-03-09 LAB — GLUCOSE, CAPILLARY
Glucose-Capillary: 130 mg/dL — ABNORMAL HIGH (ref 70–99)
Glucose-Capillary: 168 mg/dL — ABNORMAL HIGH (ref 70–99)
Glucose-Capillary: 166 mg/dL — ABNORMAL HIGH (ref 70–99)
Glucose-Capillary: 151 mg/dL — ABNORMAL HIGH (ref 70–99)

## 2021-03-09 LAB — BASIC METABOLIC PANEL
Anion gap: 8 (ref 5–15)
BUN: 53 mg/dL — ABNORMAL HIGH (ref 6–20)
CO2: 28 mmol/L (ref 22–32)
Calcium: 7.8 mg/dL — ABNORMAL LOW (ref 8.9–10.3)
Chloride: 104 mmol/L (ref 98–111)
Creatinine, Ser: 1.21 mg/dL (ref 0.61–1.24)
GFR, Estimated: >60 mL/min (ref >60)
Glucose, Bld: 128 mg/dL — ABNORMAL HIGH (ref 70–99)
Potassium: 4.5 mmol/L (ref 3.5–5.1)
Sodium: 140 mmol/L (ref 135–145)

## 2021-03-09 LAB — SURGICAL PATHOLOGY

## 2021-03-09 LAB — MAGNESIUM: Magnesium: 1.9 mg/dL (ref 1.7–2.4)

## 2021-03-09 NOTE — TOC Progression Note (Addendum)
Transition of Care [facility]) - Progression Note    Patient Details  Name: [name] MRN: [id number] Date of Birth: 1960/03/14  Transition of Care [facility]) CM/SW [place], [place] Phone Number: 03/09/2021, 5:04 PM  Clinical Narrative:     CSW met with patient at bedside. Patient states he does not wants to return to [place] but is willing to return, understanding that he has SNF limited options. CSW encouraged the patient to express any concerns and CSW will follow up. Patient states understanding.   CSW spoke with brother,[name]- CSW introduce self and explained role. Explained once patient is medically stable he will return to [place]. Patient's brother states or concerns at this time.   Informed patient has expressed he did not want to return but  advised he has limited LTC SNF options and if he wants to go to another facility he will need to coordinate request with SNF. He states understanding.   TOC  will continue to follow and assist with discharge planning.  Thurmond Butts, MSW, LCSW Clinical Social Worker    Expected Discharge Plan: Skilled Nursing Facility Barriers to Discharge: Continued Medical Work up  Expected Discharge Plan and Services Expected Discharge Plan: [place] In-house Referral: Clinical Social Work     Living arrangements for the past 2 months: [place]                                       Social Determinants of Health (SDOH) Interventions    Readmission Risk Interventions Readmission Risk Prevention Plan 01/12/2020  Transportation Screening Complete  Palliative Care Screening Not Applicable  Some recent data might be hidden

## 2021-03-09 NOTE — Progress Notes (Signed)
Patient ID: [name], male   DOB: Jan 02, 1961, 61 y.o.   MRN: [id number]  PROGRESS NOTE    [name]  LAG:[id number] DOB: 04-May-1960 DOA: 02/28/2021 PCP: Jodi Marble, MD   Brief Narrative:  61 year old M with past medical history of diabetes mellitus type 2, chronic diastolic CHF, CKD stage IIIa,  glaucoma, HTN, depression, bipolar disorder, blindness and debility/bedbound at baseline brought to ED with diabetic left foot wound infection.  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  Started on IV vancomycin, ceftriaxone and Flagyl.  Podiatry and vascular surgery consulted.  Patient underwent left TMA amputation by Dr. Jacqualyn Posey on 03/01/2021.  ABI with noncompressible RLE arteries and moderate LLE PAD.  Underwent LLE angiography with no visible pedal circulation to revascularize.  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  Palliative medicine consulted and following.  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 -ABI with noncompressible RLE arteries and moderate LLE PAD.  Underwent LLE angiography with no visible pedal circulation to revascularize.   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  Wound care as per vascular surgery recommendations. -Initially on Rocephin, Zyvox and Flagyl: Subsequently Zyvox and Flagyl discontinued.  Rocephin also discontinued on 03/08/2021 after discussion with vascular surgery. -Continue aspirin and statin.  AKI on CKD stage IIIa Acute metabolic acidosis -Creatinine improving.  1.21 today.  Acidosis has resolved.  DC IV fluids.  Repeat a.m. labs.  Acute urinary retention -Status post Foley catheter placement on 03/05/2021.  Might need voiding trial  in 5 to 7 days.  Continue Flomax  Visual hallucinations/history of bipolar disorder/depression -Unclear cause.  Continue home Zoloft.  Patient has been started on low-dose Seroquel at night.  Fall precautions.  Delirium precautions.  If symptoms do not improve, will need psychiatry evaluation  Chronic diastolic CHF -Currently euvolemic.  Demadex and Entresto on hold because of AKI.  BiDil discontinued due to hypotension.  Strict input and output.  Daily weights.  Diabetes mellitus type 2 uncontrolled with hyperglycemia -A1c 8.4.  Continue long-acting insulin along with CBGs with SSI  Hypotension History of essential hypertension -Blood pressure currently stable.  Continue amlodipine, low-dose.  Entresto, Demadex and BiDil on hold.  Hyponatremia -Improved.   Hypokalemia -Improved.   Anemia of chronic disease -Status post 1 unit packed red cell transfusion on 03/03/2021.  Transfuse if hemoglobin is less than 7.  Hemoglobin 8.4 today.  Glucoma/blindness -Continue dorzolamide, brimonidine  Debility -Reportedly bedbound at baseline.  From SNF.  Goals of care -Remains full code.Overall prognosis is poor.  Palliative care following.  Obesity -Outpatient follow-up  DVT prophylaxis: Heparin subcutaneous Code Status: Full Family Communication: None at bedside Disposition Plan: Status is: Inpatient  Remains inpatient appropriate because: Of need for SNF placement  Consultants: Podiatry/vascular surgery/palliative care  Procedures:  03/01/2021-left transmetatarsal amputation by Dr. Jacqualyn Posey 03/02/2021-LLE angiogram with no visible pedal circulation  Antimicrobials:  Anti-infectives (From admission, onward)    Start     Dose/Rate Route Frequency Ordered Stop   03/07/21 0830  cefTRIAXone (ROCEPHIN) 2 g in sodium chloride 0.9 % 100 mL IVPB  Status:  Discontinued        2 g 200 mL/hr over 30 Minutes Intravenous Every 24 hours 03/07/21 0744 03/07/21 0744  03/07/21 0830  cefTRIAXone  (ROCEPHIN) 2 g in sodium chloride 0.9 % 100 mL IVPB  Status:  Discontinued        2 g 200 mL/hr over 30 Minutes Intravenous Every 24 hours 03/07/21 0744 03/08/21 1026   03/02/21 1000  linezolid (ZYVOX) IVPB 600 mg  Status:  Discontinued        600 mg 300 mL/hr over 60 Minutes Intravenous Every 12 hours 03/02/21 0717 03/03/21 1218   03/01/21 1600  cefTRIAXone (ROCEPHIN) 2 g in sodium chloride 0.9 % 100 mL IVPB        2 g 200 mL/hr over 30 Minutes Intravenous Every 24 hours 03/01/21 1507 03/07/21 2159   03/01/21 1600  metroNIDAZOLE (FLAGYL) IVPB 500 mg  Status:  Discontinued        500 mg 100 mL/hr over 60 Minutes Intravenous Every 8 hours 03/01/21 1507 03/03/21 1218   03/01/21 1529  vancomycin variable dose per unstable renal function (pharmacist dosing)  Status:  Discontinued         Does not apply See admin instructions 03/01/21 1529 03/02/21 0717   03/01/21 0015  vancomycin (VANCOREADY) IVPB 2000 mg/400 mL        2,000 mg 200 mL/hr over 120 Minutes Intravenous  Once 03/01/21 0001 03/01/21 0725   03/01/21 0015  clindamycin (CLEOCIN) IVPB 900 mg        900 mg 100 mL/hr over 30 Minutes Intravenous  Once 03/01/21 0010 03/01/21 0104   03/01/21 0015  piperacillin-tazobactam (ZOSYN) IVPB 3.375 g        3.375 g 100 mL/hr over 30 Minutes Intravenous  Once 03/01/21 0012 03/01/21 0216        Subjective: Patient seen and examined at bedside.  Poor historian.  No agitation, vomiting, fever or seizures reported. Objective: Vitals:   03/08/21 2344 03/09/21 0024 03/09/21 0224 03/09/21 0424  BP: 128/77   128/78  Pulse: 85 87  86  Resp: 20   19  Temp: 97.8 F (36.6 C)   97.9 F (36.6 C)  TempSrc: Oral   Oral  SpO2: 98% 95% 98% (!) 84%  Weight:    102 kg  Height:        Intake/Output Summary (Last 24 hours) at 03/09/2021 0740 Last data filed at 03/09/2021 0502 Gross per 24 hour  Intake --  Output 1700 ml  Net -1700 ml    Filed Weights   03/04/21 0500 03/06/21 0616 03/09/21 0424   Weight: 97.8 kg 97.8 kg 102 kg    Examination:  General exam: No acute distress.  Currently on room air.  Looks chronically ill and deconditioned Respiratory system: Bilateral decreased breath sounds at bases with scattered crackles  cardiovascular system: S1-S2 heard; currently rate controlled gastrointestinal system: Abdomen is mildly distended; soft and nontender.  Normal bowel sounds heard  extremities: Mild right lower extremity edema present with no clubbing.  Left BKA with dressing present  Central nervous system: Extremely slow to respond.  Poor historian.  No focal neurological deficits.  Moving extremities skin: No obvious ecchymosis/lesions psychiatry: Affect is extremely flat.  Does not participate in conversation much.  Data Reviewed: I have personally reviewed following labs and imaging studies  CBC: Recent Labs  Lab 03/03/21 0818 03/03/21 2021 03/04/21 0730 03/05/21 0609 03/06/21 0353 03/07/21 0809 03/08/21 0115 03/09/21 0433  WBC 7.2  --  7.8  --   --  8.0 9.6 10.5  NEUTROABS  --   --   --   --   --  5.0 6.8 7.5  HGB 6.9*   < > 8.8* 8.9* 8.8* 8.9* 8.6* 8.4*  HCT 22.8*   < > 27.5* 27.1* 27.6* 26.9* 26.9* 27.5*  MCV 75.5*  --  76.0*  --   --  73.9* 75.4* 77.5*  PLT 356  --  356  --   --  314 322 336   < > = values in this interval not displayed.    Basic Metabolic Panel: Recent Labs  Lab 03/03/21 0818 03/04/21 0730 03/05/21 0609 03/06/21 0353 03/07/21 0809 03/08/21 0115 03/09/21 0433  NA 134* 132* 133* 134* 130* 136 140  K 4.0 3.9 3.5 3.0* 3.1* 3.9 4.5  CL 107 105 100 98 91* 98 104  CO2 15* 14* 20* 22 28 27 28   GLUCOSE 176* 145* 140* 142* 154* 158* 128*  BUN 95* 94* 88* 82* 74* 65* 53*  CREATININE 2.91* 2.85* 2.38* 1.93* 1.69* 1.46* 1.21  CALCIUM 8.2* 8.0* 7.7* 7.7* 7.4* 7.5* 7.8*  MG 2.2 2.0 1.8 1.9 1.8 1.7 1.9  PHOS 6.3* 6.1* 4.8* 4.6  --   --   --     GFR: Estimated Creatinine Clearance: 76.1 mL/min (by C-G formula based on SCr of 1.21  mg/dL). Liver Function Tests: Recent Labs  Lab 03/03/21 0818 03/04/21 0730 03/05/21 0609 03/06/21 0353  ALBUMIN 2.0* 2.0* 1.9* 1.9*    No results for input(s): LIPASE, AMYLASE in the last 168 hours. No results for input(s): AMMONIA in the last 168 hours. Coagulation Profile: No results for input(s): INR, PROTIME in the last 168 hours. Cardiac Enzymes: No results for input(s): CKTOTAL, CKMB, CKMBINDEX, TROPONINI in the last 168 hours. BNP (last 3 results) No results for input(s): PROBNP in the last 8760 hours. HbA1C: No results for input(s): HGBA1C in the last 72 hours. CBG: Recent Labs  Lab 03/08/21 0636 03/08/21 1150 03/08/21 1706 03/08/21 2126 03/09/21 0621  GLUCAP 156* 169* 134* 123* 130*    Lipid Profile: No results for input(s): CHOL, HDL, LDLCALC, TRIG, CHOLHDL, LDLDIRECT in the last 72 hours. Thyroid Function Tests: No results for input(s): TSH, T4TOTAL, FREET4, T3FREE, THYROIDAB in the last 72 hours. Anemia Panel: No results for input(s): VITAMINB12, FOLATE, FERRITIN, TIBC, IRON, RETICCTPCT in the last 72 hours. Sepsis Labs: No results for input(s): PROCALCITON, LATICACIDVEN in the last 168 hours.   Recent Results (from the past 240 hour(s))  Resp Panel by RT-PCR (Flu A&B, Covid) Nasopharyngeal Swab     Status: None   Collection Time: 03/01/21  8:20 AM   Specimen: Nasopharyngeal Swab; Nasopharyngeal(NP) swabs in vial transport medium  Result Value Ref Range Status   SARS Coronavirus 2 by RT PCR NEGATIVE NEGATIVE Final    Comment: (NOTE) SARS-CoV-2 target nucleic acids are NOT DETECTED.  The SARS-CoV-2 RNA is generally detectable in upper respiratory specimens during the acute phase of infection. The lowest concentration of SARS-CoV-2 viral copies this assay can detect is 138 copies/mL. A negative result does not preclude SARS-Cov-2 infection and should not be used as the sole basis for treatment or other patient management decisions. A negative result  may occur with  improper specimen collection/handling, submission of specimen other than nasopharyngeal swab, presence of viral mutation(s) within the areas targeted by this assay, and inadequate number of viral copies(<138 copies/mL). A negative result must be combined with clinical observations, patient history, and epidemiological information. The expected result is Negative.  Fact Sheet for Patients:  EntrepreneurPulse.com.au  Fact Sheet for Healthcare Providers:  IncredibleEmployment.be  This test is  no t yet approved or cleared by the [place] and  has been authorized for detection and/or diagnosis of SARS-CoV-2 by FDA under an Emergency Use Authorization (EUA). This EUA will remain  in effect (meaning this test can be used) for the duration of the COVID-19 declaration under Section 564(b)(1) of the Act, 21 U.S.C.section 360bbb-3(b)(1), unless the authorization is terminated  or revoked sooner.       Influenza A by PCR NEGATIVE NEGATIVE Final   Influenza B by PCR NEGATIVE NEGATIVE Final    Comment: (NOTE) The Xpert Xpress SARS-CoV-2/FLU/RSV plus assay is intended as an aid in the diagnosis of influenza from Nasopharyngeal swab specimens and should not be used as a sole basis for treatment. Nasal washings and aspirates are unacceptable for Xpert Xpress SARS-CoV-2/FLU/RSV testing.  Fact Sheet for Patients: EntrepreneurPulse.com.au  Fact Sheet for Healthcare Providers: IncredibleEmployment.be  This test is not yet approved or cleared by the [place] FDA and has been authorized for detection and/or diagnosis of SARS-CoV-2 by FDA under an Emergency Use Authorization (EUA). This EUA will remain in effect (meaning this test can be used) for the duration of the COVID-19 declaration under Section 564(b)(1) of the Act, 21 U.S.C. section 360bbb-3(b)(1), unless the authorization is terminated  or revoked.  Performed at [facility], [place] [address], [place], [place] [address]   Aerobic/Anaerobic Culture w Gram Stain (surgical/deep wound)     Status: None   Collection Time: 03/01/21  1:05 PM   Specimen: Foot, Left; Wound  Result Value Ref Range Status   Specimen Description WOUND  Final   Special Requests LEFT FOOT SPEC A  Final   Gram Stain NO WBC SEEN NO ORGANISMS SEEN   Final   Culture   Final    FEW PROTEUS MIRABILIS NO ANAEROBES ISOLATED Performed at [facility], [address] [place], [place] [address]    Report Status 03/06/2021 FINAL  Final   Organism ID, Bacteria PROTEUS MIRABILIS  Final      Susceptibility   Proteus mirabilis - MIC*    AMPICILLIN >=32 RESISTANT Resistant     CEFAZOLIN >=64 RESISTANT Resistant     CEFEPIME <=0.12 SENSITIVE Sensitive     CEFTAZIDIME 2 SENSITIVE Sensitive     CEFTRIAXONE 1 SENSITIVE Sensitive     CIPROFLOXACIN <=0.25 SENSITIVE Sensitive     GENTAMICIN <=1 SENSITIVE Sensitive     IMIPENEM 2 SENSITIVE Sensitive     TRIMETH/SULFA <=20 SENSITIVE Sensitive     AMPICILLIN/SULBACTAM >=32 RESISTANT Resistant     PIP/TAZO <=4 SENSITIVE Sensitive     * FEW PROTEUS MIRABILIS  Blood Cultures x 2 [name]     Status: None   Collection Time: 03/01/21  3:45 PM   Specimen: BLOOD LEFT HAND  Result Value Ref Range Status   Specimen Description BLOOD LEFT HAND  Final   Special Requests   Final    BOTTLES DRAWN AEROBIC AND ANAEROBIC Blood Culture adequate volume   Culture   Final    NO GROWTH 5 DAYS Performed at [facility], [address], [place], [place] [address]    Report Status 03/06/2021 FINAL  Final  Blood Cultures x 2 [name]     Status: None   Collection Time: 03/01/21  3:53 PM   Specimen: BLOOD RIGHT ARM  Result Value Ref Range Status   Specimen Description BLOOD RIGHT ARM  Final   Special Requests   Final    BOTTLES DRAWN AEROBIC  AND ANAEROBIC Blood Culture results may not be optimal due to an excessive  volume of blood received in culture bottles   Culture   Final    NO GROWTH 5 DAYS Performed at [facility] [address], [place], [place] [address]    Report Status 03/06/2021 FINAL  Final  Urine Culture     Status: None   Collection Time: 03/06/21  7:02 AM   Specimen: Urine, Catheterized  Result Value Ref Range Status   Specimen Description URINE, CATHETERIZED  Final   Special Requests NONE  Final   Culture   Final    NO GROWTH Performed at [facility] [address] [place], [place] [address]    Report Status 03/08/2021 FINAL  Final          Radiology Studies: No results found.      Scheduled Meds:  sodium chloride   Intravenous Once   amLODipine  5 mg Oral Daily   atorvastatin  80 mg Oral QHS   brimonidine  1 drop Both Eyes BID   Chlorhexidine Gluconate Cloth  6 each Topical Daily   dorzolamide  1 drop Both Eyes BID   heparin  5,000 Units Subcutaneous Q8H   insulin aspart  0-5 Units Subcutaneous QHS   insulin aspart  0-9 Units Subcutaneous TID WC   insulin glargine-yfgn  3 Units Subcutaneous Daily   multivitamin with minerals  1 tablet Oral Daily   pantoprazole  40 mg Oral Daily   pregabalin  75 mg Oral BID   QUEtiapine  25 mg Oral QHS   saccharomyces boulardii  250 mg Oral BID   senna-docusate  1 tablet Oral Daily   sertraline  50 mg Oral QHS   tamsulosin  0.8 mg Oral Daily   Continuous Infusions:  0.9 % NaCl with KCl 40 mEq / L 50 mL/hr at 03/08/21 2025          Aline August, MD [facility] 03/09/2021, 7:40 AM

## 2021-03-09 NOTE — Progress Notes (Addendum)
[facility] PROGRESS NOTE  ASSESSMENT / PLAN: [name] is a 61 y.o. male status post left below knee amputation. Pain seems to be well controlled, but difficult to assess given delirium. Mobilize as able with PT / OT / OOB. Will need SNF at discharge.    SUBJECTIVE: Sleeping comfortably.  OBJECTIVE: BP 128/78 (BP Location: Right Arm)    Pulse 86    Temp 97.9 F (36.6 C) (Oral)    Resp 19    Ht 5\' 8"  (1.727 m)    Wt 102 kg    SpO2 98%    BMI 34.19 kg/m   Intake/Output Summary (Last 24 hours) at 03/09/2021 1043 Last data filed at 03/09/2021 0502 Gross per 24 hour  Intake --  Output 1200 ml  Net -1200 ml     No acute distress VAC to amputation site with good seal. Stump protector in place.  CBC Latest Ref Rng & Units 03/09/2021 03/08/2021 03/07/2021  WBC 4.0 - 10.5 K/uL 10.5 9.6 8.0  Hemoglobin 13.0 - 17.0 g/dL 8.4(L) 8.6(L) 8.9(L)  Hematocrit 39.0 - 52.0 % 27.5(L) 26.9(L) 26.9(L)  Platelets 150 - 400 K/uL 336 322 314     CMP Latest Ref Rng & Units 03/09/2021 03/08/2021 03/07/2021  Glucose 70 - 99 mg/dL 128(H) 158(H) 154(H)  BUN 6 - 20 mg/dL 53(H) 65(H) 74(H)  Creatinine 0.61 - 1.24 mg/dL 1.21 1.46(H) 1.69(H)  Sodium 135 - 145 mmol/L 140 136 130(L)  Potassium 3.5 - 5.1 mmol/L 4.5 3.9 3.1(L)  Chloride 98 - 111 mmol/L 104 98 91(L)  CO2 22 - 32 mmol/L 28 27 28   Calcium 8.9 - 10.3 mg/dL 7.8(L) 7.5(L) 7.4(L)  Total Protein 6.5 - 8.1 g/dL - - -  Total Bilirubin 0.3 - 1.2 mg/dL - - -  Alkaline Phos 38 - 126 U/L - - -  AST 15 - 41 U/L - - -  ALT 0 - 44 U/L - - -    Estimated Creatinine Clearance: 76.1 mL/min (by C-G formula based on SCr of 1.21 mg/dL).   [name]. Stanford Breed, MD [facility] Phone Number: [phone] 03/09/2021 10:43 AM

## 2021-03-09 NOTE — Plan of Care (Signed)

## 2021-03-10 DIAGNOSIS — I10 Essential (primary) hypertension: Secondary | ICD-10-CM | POA: Diagnosis not present

## 2021-03-10 DIAGNOSIS — I5032 Chronic diastolic (congestive) heart failure: Secondary | ICD-10-CM | POA: Diagnosis not present

## 2021-03-10 DIAGNOSIS — N179 Acute kidney failure, unspecified: Secondary | ICD-10-CM | POA: Diagnosis not present

## 2021-03-10 DIAGNOSIS — E11628 Type 2 diabetes mellitus with other skin complications: Secondary | ICD-10-CM | POA: Diagnosis not present

## 2021-03-10 LAB — GLUCOSE, CAPILLARY
Glucose-Capillary: 142 mg/dL — ABNORMAL HIGH (ref 70–99)
Glucose-Capillary: 138 mg/dL — ABNORMAL HIGH (ref 70–99)
Glucose-Capillary: 134 mg/dL — ABNORMAL HIGH (ref 70–99)
Glucose-Capillary: 135 mg/dL — ABNORMAL HIGH (ref 70–99)

## 2021-03-10 MED ORDER — QUETIAPINE FUMARATE 25 MG PO TABS
25.0000 mg | ORAL_TABLET | Freq: Every evening | ORAL | Status: DC | PRN
  Administered 2021-03-10: 25 mg via ORAL
  Filled 2021-03-10: qty 1

## 2021-03-10 NOTE — Progress Notes (Signed)
Patient ID: [name], male   DOB: December 20, 1960, 61 y.o.   MRN: [id number]  PROGRESS NOTE    HEZEKIAH VELTRE  ZCH:[id number] DOB: June 13, 1960 DOA: 02/28/2021 PCP: Jodi Marble, MD   Brief Narrative:  61 year old M with past medical history of diabetes mellitus type 2, chronic diastolic CHF, CKD stage IIIa,  glaucoma, HTN, depression, bipolar disorder, blindness and debility/bedbound at baseline brought to ED with diabetic left foot wound infection.  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  Started on IV vancomycin, ceftriaxone and Flagyl.  Podiatry and vascular surgery consulted.  Patient underwent left TMA amputation by Dr. Jacqualyn Posey on 03/01/2021.  ABI with noncompressible RLE arteries and moderate LLE PAD.  Underwent LLE angiography with no visible pedal circulation to revascularize.  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  Palliative medicine consulted and following.  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 -ABI with noncompressible RLE arteries and moderate LLE PAD.  Underwent LLE angiography with no visible pedal circulation to revascularize.   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  Wound care as per vascular surgery recommendations. -Initially on Rocephin, Zyvox and Flagyl: Subsequently Zyvox and Flagyl discontinued.  Rocephin also discontinued on 03/08/2021 after discussion with vascular surgery. -Continue aspirin and statin.  AKI on CKD stage IIIa Acute metabolic acidosis -Creatinine improving.  1.21 on 03/09/2021.  No labs today.  Acidosis has resolved.  DC'd IV fluids on 03/09/2021.  Repeat a.m. labs.  Acute urinary retention -Status post Foley catheter placement  on 03/05/2021.  Might need voiding trial soon.  Continue Flomax  Visual hallucinations/history of bipolar disorder/depression -Unclear cause.  Continue home Zoloft.  Patient has been started on low-dose Seroquel at night.  Since then, he has been very sleepy during the daytime.  Will DC Seroquel today and monitor.  Fall precautions.  Delirium precautions.  If symptoms do not improve, will need psychiatry evaluation  Chronic diastolic CHF -Currently euvolemic.  Demadex and Entresto on hold because of AKI.  BiDil discontinued due to hypotension.  Strict input and output.  Daily weights.  Diabetes mellitus type 2 uncontrolled with hyperglycemia -A1c 8.4.  Continue long-acting insulin along with CBGs with SSI  Hypotension History of essential hypertension -Blood pressure currently stable.  Continue amlodipine, low-dose.  Entresto, Demadex and BiDil on hold.  Hyponatremia -Improved.   Hypokalemia -Improved.   Anemia of chronic disease -Status post 1 unit packed red cell transfusion on 03/03/2021.  Transfuse if hemoglobin is less than 7.  Hemoglobin 8.4 on 03/09/2021.  Monitor intermittently.  Glucoma/blindness -Continue dorzolamide, brimonidine  Debility -Reportedly bedbound at baseline.  From SNF.  Goals of care -Remains full code.Overall prognosis is poor.  Palliative care following intermittently.  Obesity -Outpatient follow-up  DVT prophylaxis: Heparin subcutaneous Code Status: Full Family Communication: None at bedside Disposition Plan: Status is: Inpatient  Remains inpatient appropriate because: Of need for SNF placement.  Currently medically stable for discharge.  Consultants: Podiatry/vascular surgery/palliative care  Procedures:  03/01/2021-left transmetatarsal amputation by Dr. Jacqualyn Posey 03/02/2021-LLE angiogram with no visible pedal circulation  Antimicrobials:  Anti-infectives (From admission, onward)    Start     Dose/Rate Route Frequency Ordered Stop   03/07/21  0830  cefTRIAXone (ROCEPHIN) 2 g in  sodium chloride 0.9 % 100 mL IVPB  Status:  Discontinued        2 g 200 mL/hr over 30 Minutes Intravenous Every 24 hours 03/07/21 0744 03/07/21 0744   03/07/21 0830  cefTRIAXone (ROCEPHIN) 2 g in sodium chloride 0.9 % 100 mL IVPB  Status:  Discontinued        2 g 200 mL/hr over 30 Minutes Intravenous Every 24 hours 03/07/21 0744 03/08/21 1026   03/02/21 1000  linezolid (ZYVOX) IVPB 600 mg  Status:  Discontinued        600 mg 300 mL/hr over 60 Minutes Intravenous Every 12 hours 03/02/21 0717 03/03/21 1218   03/01/21 1600  cefTRIAXone (ROCEPHIN) 2 g in sodium chloride 0.9 % 100 mL IVPB        2 g 200 mL/hr over 30 Minutes Intravenous Every 24 hours 03/01/21 1507 03/07/21 2159   03/01/21 1600  metroNIDAZOLE (FLAGYL) IVPB 500 mg  Status:  Discontinued        500 mg 100 mL/hr over 60 Minutes Intravenous Every 8 hours 03/01/21 1507 03/03/21 1218   03/01/21 1529  vancomycin variable dose per unstable renal function (pharmacist dosing)  Status:  Discontinued         Does not apply See admin instructions 03/01/21 1529 03/02/21 0717   03/01/21 0015  vancomycin (VANCOREADY) IVPB 2000 mg/400 mL        2,000 mg 200 mL/hr over 120 Minutes Intravenous  Once 03/01/21 0001 03/01/21 0725   03/01/21 0015  clindamycin (CLEOCIN) IVPB 900 mg        900 mg 100 mL/hr over 30 Minutes Intravenous  Once 03/01/21 0010 03/01/21 0104   03/01/21 0015  piperacillin-tazobactam (ZOSYN) IVPB 3.375 g        3.375 g 100 mL/hr over 30 Minutes Intravenous  Once 03/01/21 0012 03/01/21 0216        Subjective: Patient seen and examined at bedside.  Poor historian.  No fever, agitation, vomiting reported.   Objective: Vitals:   03/09/21 2000 03/09/21 2328 03/10/21 0353 03/10/21 0500  BP: 123/80 112/78 120/79   Pulse: 86 88 97   Resp: 17 17 16    Temp: (!) 97.5 F (36.4 C) 98.9 F (37.2 C) 98.9 F (37.2 C)   TempSrc: Oral Oral Oral   SpO2: 96% 98% 91%   Weight:    96.4 kg   Height:        Intake/Output Summary (Last 24 hours) at 03/10/2021 0800 Last data filed at 03/10/2021 0600 Gross per 24 hour  Intake 1070 ml  Output 1350 ml  Net -280 ml    Filed Weights   03/06/21 0616 03/09/21 0424 03/10/21 0500  Weight: 97.8 kg 102 kg 96.4 kg    Examination:  General exam: On room air currently.  No distress.  Looks chronically ill and deconditioned Respiratory system: Decreased breath sounds at bases bilaterally with some crackles  cardiovascular system: Rate controlled; S1-S2 heard gastrointestinal system: Abdomen is distended slightly; soft and nontender.  Bowel sounds are heard  extremities: Trace right lower extremity edema present with no cyanosis.  Left BKA with dressing present  Central nervous system: Still very slow to respond.  Poor historian.  No focal neurological deficits.  Moves extremities skin: No obvious petechiae/rashes psychiatry: Very flat affect.  Hardly participates in any conversation.  Data Reviewed: I have personally reviewed following labs and imaging studies  CBC: Recent Labs  Lab 03/03/21 0818 03/03/21 2021 03/04/21 0730 03/05/21 7517 03/06/21 0353 03/07/21  0809 03/08/21 0115 03/09/21 0433  WBC 7.2  --  7.8  --   --  8.0 9.6 10.5  NEUTROABS  --   --   --   --   --  5.0 6.8 7.5  HGB 6.9*   < > 8.8* 8.9* 8.8* 8.9* 8.6* 8.4*  HCT 22.8*   < > 27.5* 27.1* 27.6* 26.9* 26.9* 27.5*  MCV 75.5*  --  76.0*  --   --  73.9* 75.4* 77.5*  PLT 356  --  356  --   --  314 322 336   < > = values in this interval not displayed.    Basic Metabolic Panel: Recent Labs  Lab 03/03/21 0818 03/04/21 0730 03/05/21 0609 03/06/21 0353 03/07/21 0809 03/08/21 0115 03/09/21 0433  NA 134* 132* 133* 134* 130* 136 140  K 4.0 3.9 3.5 3.0* 3.1* 3.9 4.5  CL 107 105 100 98 91* 98 104  CO2 15* 14* 20* 22 28 27 28   GLUCOSE 176* 145* 140* 142* 154* 158* 128*  BUN 95* 94* 88* 82* 74* 65* 53*  CREATININE 2.91* 2.85* 2.38* 1.93* 1.69* 1.46* 1.21   CALCIUM 8.2* 8.0* 7.7* 7.7* 7.4* 7.5* 7.8*  MG 2.2 2.0 1.8 1.9 1.8 1.7 1.9  PHOS 6.3* 6.1* 4.8* 4.6  --   --   --     GFR: Estimated Creatinine Clearance: 74 mL/min (by C-G formula based on SCr of 1.21 mg/dL). Liver Function Tests: Recent Labs  Lab 03/03/21 0818 03/04/21 0730 03/05/21 0609 03/06/21 0353  ALBUMIN 2.0* 2.0* 1.9* 1.9*    No results for input(s): LIPASE, AMYLASE in the last 168 hours. No results for input(s): AMMONIA in the last 168 hours. Coagulation Profile: No results for input(s): INR, PROTIME in the last 168 hours. Cardiac Enzymes: No results for input(s): CKTOTAL, CKMB, CKMBINDEX, TROPONINI in the last 168 hours. BNP (last 3 results) No results for input(s): PROBNP in the last 8760 hours. HbA1C: No results for input(s): HGBA1C in the last 72 hours. CBG: Recent Labs  Lab 03/09/21 0621 03/09/21 1215 03/09/21 1753 03/09/21 2121 03/10/21 0619  GLUCAP 130* 168* 166* 151* 142*    Lipid Profile: No results for input(s): CHOL, HDL, LDLCALC, TRIG, CHOLHDL, LDLDIRECT in the last 72 hours. Thyroid Function Tests: No results for input(s): TSH, T4TOTAL, FREET4, T3FREE, THYROIDAB in the last 72 hours. Anemia Panel: No results for input(s): VITAMINB12, FOLATE, FERRITIN, TIBC, IRON, RETICCTPCT in the last 72 hours. Sepsis Labs: No results for input(s): PROCALCITON, LATICACIDVEN in the last 168 hours.   Recent Results (from the past 240 hour(s))  Resp Panel by RT-PCR (Flu A&B, Covid) Nasopharyngeal Swab     Status: None   Collection Time: 03/01/21  8:20 AM   Specimen: Nasopharyngeal Swab; Nasopharyngeal(NP) swabs in vial transport medium  Result Value Ref Range Status   SARS Coronavirus 2 by RT PCR NEGATIVE NEGATIVE Final    Comment: (NOTE) SARS-CoV-2 target nucleic acids are NOT DETECTED.  The SARS-CoV-2 RNA is generally detectable in upper respiratory specimens during the acute phase of infection. The lowest concentration of SARS-CoV-2 viral copies  this assay can detect is 138 copies/mL. A negative result does not preclude SARS-Cov-2 infection and should not be used as the sole basis for treatment or other patient management decisions. A negative result may occur with  improper specimen collection/handling, submission of specimen other than nasopharyngeal swab, presence of viral mutation(s) within the areas targeted by this assay, and inadequate number of viral copies(<138 copies/mL).  A negative result must be combined with clinical observations, patient history, and epidemiological information. The expected result is Negative.  Fact Sheet for Patients:  EntrepreneurPulse.com.au  Fact Sheet for Healthcare Providers:  IncredibleEmployment.be  This test is no t yet approved or cleared by the [place] FDA and  has been authorized for detection and/or diagnosis of SARS-CoV-2 by FDA under an Emergency Use Authorization (EUA). This EUA will remain  in effect (meaning this test can be used) for the duration of the COVID-19 declaration under Section 564(b)(1) of the Act, 21 U.S.C.section 360bbb-3(b)(1), unless the authorization is terminated  or revoked sooner.       Influenza A by PCR NEGATIVE NEGATIVE Final   Influenza B by PCR NEGATIVE NEGATIVE Final    Comment: (NOTE) The Xpert Xpress SARS-CoV-2/FLU/RSV plus assay is intended as an aid in the diagnosis of influenza from Nasopharyngeal swab specimens and should not be used as a sole basis for treatment. Nasal washings and aspirates are unacceptable for Xpert Xpress SARS-CoV-2/FLU/RSV testing.  Fact Sheet for Patients: EntrepreneurPulse.com.au  Fact Sheet for Healthcare Providers: IncredibleEmployment.be  This test is not yet approved or cleared by the [place] FDA and has been authorized for detection and/or diagnosis of SARS-CoV-2 by FDA under an Emergency Use Authorization (EUA). This EUA  will remain in effect (meaning this test can be used) for the duration of the COVID-19 declaration under Section 564(b)(1) of the Act, 21 U.S.C. section 360bbb-3(b)(1), unless the authorization is terminated or revoked.  Performed at [facility], [place] [address], [place], [place] [address]   Aerobic/Anaerobic Culture w Gram Stain (surgical/deep wound)     Status: None   Collection Time: 03/01/21  1:05 PM   Specimen: Foot, Left; Wound  Result Value Ref Range Status   Specimen Description WOUND  Final   Special Requests LEFT FOOT SPEC A  Final   Gram Stain NO WBC SEEN NO ORGANISMS SEEN   Final   Culture   Final    FEW PROTEUS MIRABILIS NO ANAEROBES ISOLATED Performed at [facility], [address], [place], [place] [address]    Report Status 03/06/2021 FINAL  Final   Organism ID, Bacteria PROTEUS MIRABILIS  Final      Susceptibility   Proteus mirabilis - MIC*    AMPICILLIN >=32 RESISTANT Resistant     CEFAZOLIN >=64 RESISTANT Resistant     CEFEPIME <=0.12 SENSITIVE Sensitive     CEFTAZIDIME 2 SENSITIVE Sensitive     CEFTRIAXONE 1 SENSITIVE Sensitive     CIPROFLOXACIN <=0.25 SENSITIVE Sensitive     GENTAMICIN <=1 SENSITIVE Sensitive     IMIPENEM 2 SENSITIVE Sensitive     TRIMETH/SULFA <=20 SENSITIVE Sensitive     AMPICILLIN/SULBACTAM >=32 RESISTANT Resistant     PIP/TAZO <=4 SENSITIVE Sensitive     * FEW PROTEUS MIRABILIS  Blood Cultures x 2 sites     Status: None   Collection Time: 03/01/21  3:45 PM   Specimen: BLOOD LEFT HAND  Result Value Ref Range Status   Specimen Description BLOOD LEFT HAND  Final   Special Requests   Final    BOTTLES DRAWN AEROBIC AND ANAEROBIC Blood Culture adequate volume   Culture   Final    NO GROWTH 5 DAYS Performed at [facility], [address] [place], [place] [address]    Report Status 03/06/2021 FINAL  Final  Blood Cultures x 2 sites     Status: None   Collection Time:  03/01/21  3:53 PM   Specimen: BLOOD RIGHT  ARM  Result Value Ref Range Status   Specimen Description BLOOD RIGHT ARM  Final   Special Requests   Final    BOTTLES DRAWN AEROBIC AND ANAEROBIC Blood Culture results may not be optimal due to an excessive volume of blood received in culture bottles   Culture   Final    NO GROWTH 5 DAYS Performed at [facility], [place] [address], [place], [place] [address]    Report Status 03/06/2021 FINAL  Final  Urine Culture     Status: None   Collection Time: 03/06/21  7:02 AM   Specimen: Urine, Catheterized  Result Value Ref Range Status   Specimen Description URINE, CATHETERIZED  Final   Special Requests NONE  Final   Culture   Final    NO GROWTH Performed at [facility] [address], [place], [place] [address]    Report Status 03/08/2021 FINAL  Final          Radiology Studies: No results found.      Scheduled Meds:  sodium chloride   Intravenous Once   amLODipine  5 mg Oral Daily   atorvastatin  80 mg Oral QHS   brimonidine  1 drop Both Eyes BID   Chlorhexidine Gluconate Cloth  6 each Topical Daily   dorzolamide  1 drop Both Eyes BID   heparin  5,000 Units Subcutaneous Q8H   insulin aspart  0-5 Units Subcutaneous QHS   insulin aspart  0-9 Units Subcutaneous TID WC   insulin glargine-yfgn  3 Units Subcutaneous Daily   multivitamin with minerals  1 tablet Oral Daily   pantoprazole  40 mg Oral Daily   pregabalin  75 mg Oral BID   QUEtiapine  25 mg Oral QHS   saccharomyces boulardii  250 mg Oral BID   senna-docusate  1 tablet Oral Daily   sertraline  50 mg Oral QHS   tamsulosin  0.8 mg Oral Daily   Continuous Infusions:          Aline August, MD [facility] 03/10/2021, 8:00 AM

## 2021-03-10 NOTE — Progress Notes (Signed)
PT Cancellation Note  Patient Details Name: [name] MRN: [id number] DOB: December 11, 1960   Cancelled Treatment:    Reason Eval/Treat Not Completed: PT screened, no needs identified, will sign off. This patient has been evaluated by PT on 1/7, please see that evaluation for further detail. However, pt is bedbound and totalA at baseline and therefore has no acute PT needs.   [place] Carbo, PT, DPT   Acute Rehabilitation Department Pager #: [phone]   Sandra Cockayne 03/10/2021, 1:47 PM

## 2021-03-10 NOTE — Progress Notes (Signed)
OT Cancellation Note and Discharge  Patient Details Name: [name] MRN: [id number] DOB: Jul 12, 1960   Cancelled Treatment:    Reason Eval/Treat Not Completed: Other (comment). See PT's note from 11/7---no skilled OT needs, pt was total care for basic ADLs pta.  [place], OTR/L Acute Rehab Services Pager [phone] Office [phone]    Almon Register 03/10/2021, 1:42 PM

## 2021-03-11 DIAGNOSIS — N179 Acute kidney failure, unspecified: Secondary | ICD-10-CM | POA: Diagnosis not present

## 2021-03-11 DIAGNOSIS — I5032 Chronic diastolic (congestive) heart failure: Secondary | ICD-10-CM | POA: Diagnosis not present

## 2021-03-11 DIAGNOSIS — E11628 Type 2 diabetes mellitus with other skin complications: Secondary | ICD-10-CM | POA: Diagnosis not present

## 2021-03-11 DIAGNOSIS — E1165 Type 2 diabetes mellitus with hyperglycemia: Secondary | ICD-10-CM | POA: Diagnosis not present

## 2021-03-11 LAB — GLUCOSE, CAPILLARY
Glucose-Capillary: 102 mg/dL — ABNORMAL HIGH (ref 70–99)
Glucose-Capillary: 142 mg/dL — ABNORMAL HIGH (ref 70–99)
Glucose-Capillary: 164 mg/dL — ABNORMAL HIGH (ref 70–99)
Glucose-Capillary: 141 mg/dL — ABNORMAL HIGH (ref 70–99)

## 2021-03-11 LAB — TYPE AND SCREEN
ABO/RH(D): O POS
Antibody Screen: NEGATIVE
Unit division: 0
Unit division: 0

## 2021-03-11 LAB — BPAM RBC
Blood Product Expiration Date: 202302032359
Blood Product Expiration Date: 202302032359
Unit Type and Rh: 5100
Unit Type and Rh: 5100

## 2021-03-11 NOTE — Progress Notes (Signed)
Patient ID: [name], male   DOB: 06-06-1960, 61 y.o.   MRN: [id number]  PROGRESS NOTE    [name]  NWG:[id number] DOB: 1961/01/23 DOA: 02/28/2021 PCP: Jodi Marble, MD   Brief Narrative:  61 year old M with past medical history of diabetes mellitus type 2, chronic diastolic CHF, CKD stage IIIa,  glaucoma, HTN, depression, bipolar disorder, blindness and debility/bedbound at baseline brought to ED with diabetic left foot wound infection.  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  Started on IV vancomycin, ceftriaxone and Flagyl.  Podiatry and vascular surgery consulted.  Patient underwent left TMA amputation by Dr. Jacqualyn Posey on 03/01/2021.  ABI with noncompressible RLE arteries and moderate LLE PAD.  Underwent LLE angiography with no visible pedal circulation to revascularize.  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  Palliative medicine consulted and following.  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 -ABI with noncompressible RLE arteries and moderate LLE PAD.  Underwent LLE angiography with no visible pedal circulation to revascularize.   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  Wound care as per vascular surgery recommendations. -Continue aspirin and statin.  AKI on CKD stage IIIa Acute metabolic acidosis -Creatinine improving.  1.21 on 03/09/2021.  No labs today.  Acidosis has resolved.  DC'd IV fluids on 03/09/2021.    Acute urinary retention -Status post Foley catheter placement on 03/05/2021.  Might need voiding trial soon.  Continue Flomax  Visual hallucinations/history of bipolar disorder/depression -Unclear cause.  Continue home Zoloft.  Patient has been  started on low-dose Seroquel at night.  Since then, he has been very sleepy during the daytime. Dc'd Seroquel on 03/10/2021.  Fall precautions.  Delirium precautions.  If symptoms do not improve, will need psychiatry evaluation  Chronic diastolic CHF -Currently euvolemic.  Demadex and Entresto on hold because of AKI.  BiDil discontinued due to hypotension.  Strict input and output.  Daily weights.  Diabetes mellitus type 2 uncontrolled with hyperglycemia -A1c 8.4.  Continue long-acting insulin along with CBGs with SSI  Hypotension History of essential hypertension -Blood pressure currently stable.  Continue amlodipine, low-dose.  Entresto, Demadex and BiDil on hold.  Hyponatremia -Improved.   Hypokalemia -Improved.   Anemia of chronic disease -Status post 1 unit packed red cell transfusion on 03/03/2021.  Transfuse if hemoglobin is less than 7.  Hemoglobin 8.4 on 03/09/2021.  Monitor intermittently.  Glucoma/blindness -Continue dorzolamide, brimonidine  Debility -Reportedly bedbound at baseline.  From SNF.  Goals of care -Remains full code.Overall prognosis is poor.  Palliative care following intermittently.  Obesity -Outpatient follow-up  DVT prophylaxis: Heparin subcutaneous Code Status: Full Family Communication: None at bedside Disposition Plan: Status is: Inpatient  Remains inpatient appropriate because: Of need for SNF placement.  Currently medically stable for discharge.  Consultants: Podiatry/vascular surgery/palliative care  Procedures:  03/01/2021-left transmetatarsal amputation by Dr. Jacqualyn Posey 03/02/2021-LLE angiogram with no visible pedal circulation  Antimicrobials:  Anti-infectives (From admission, onward)    Start     Dose/Rate Route Frequency Ordered Stop   03/07/21 0830  cefTRIAXone (ROCEPHIN) 2 g in sodium chloride 0.9 % 100 mL IVPB  Status:  Discontinued        2 g 200 mL/hr over 30 Minutes Intravenous Every 24  hours 03/07/21 0744 03/07/21 0744    03/07/21 0830  cefTRIAXone (ROCEPHIN) 2 g in sodium chloride 0.9 % 100 mL IVPB  Status:  Discontinued        2 g 200 mL/hr over 30 Minutes Intravenous Every 24 hours 03/07/21 0744 03/08/21 1026   03/02/21 1000  linezolid (ZYVOX) IVPB 600 mg  Status:  Discontinued        600 mg 300 mL/hr over 60 Minutes Intravenous Every 12 hours 03/02/21 0717 03/03/21 1218   03/01/21 1600  cefTRIAXone (ROCEPHIN) 2 g in sodium chloride 0.9 % 100 mL IVPB        2 g 200 mL/hr over 30 Minutes Intravenous Every 24 hours 03/01/21 1507 03/07/21 2159   03/01/21 1600  metroNIDAZOLE (FLAGYL) IVPB 500 mg  Status:  Discontinued        500 mg 100 mL/hr over 60 Minutes Intravenous Every 8 hours 03/01/21 1507 03/03/21 1218   03/01/21 1529  vancomycin variable dose per unstable renal function (pharmacist dosing)  Status:  Discontinued         Does not apply See admin instructions 03/01/21 1529 03/02/21 0717   03/01/21 0015  vancomycin (VANCOREADY) IVPB 2000 mg/400 mL        2,000 mg 200 mL/hr over 120 Minutes Intravenous  Once 03/01/21 0001 03/01/21 0725   03/01/21 0015  clindamycin (CLEOCIN) IVPB 900 mg        900 mg 100 mL/hr over 30 Minutes Intravenous  Once 03/01/21 0010 03/01/21 0104   03/01/21 0015  piperacillin-tazobactam (ZOSYN) IVPB 3.375 g        3.375 g 100 mL/hr over 30 Minutes Intravenous  Once 03/01/21 0012 03/01/21 0216        Subjective: Patient seen and examined at bedside.  Poor historian.  Patient had to be given Seroquel overnight for agitation as per RN staff.  No fever, vomiting, worsening shortness breath reported.   Objective: Vitals:   03/10/21 1938 03/10/21 2349 03/11/21 0416 03/11/21 0718  BP: 121/76 125/77 137/83 130/78  Pulse: 89 80 80 79  Resp: 17 17 16 16   Temp: 99.2 F (37.3 C) 99.3 F (37.4 C) 98.9 F (37.2 C) 98.9 F (37.2 C)  TempSrc: Oral Axillary Axillary Axillary  SpO2: 97% 96% 99% 100%  Weight:      Height:        Intake/Output Summary (Last 24 hours) at 03/11/2021  0744 Last data filed at 03/11/2021 0721 Gross per 24 hour  Intake 720 ml  Output 1625 ml  Net -905 ml    Filed Weights   03/06/21 0616 03/09/21 0424 03/10/21 0500  Weight: 97.8 kg 102 kg 96.4 kg    Examination:  General exam: No acute distress.  Currently on room air.  Looks chronically ill and deconditioned Respiratory system: Bilateral decreased breath sounds at bases with scattered crackles  cardiovascular system: S1-S2 heard; currently rate controlled gastrointestinal system: Abdomen is mildly distended; soft and nontender.  Normal bowel sounds heard  extremities: No clubbing; mild right lower extremity edema present.  Left BKA with dressing present  Central nervous system: More awake this morning but still slow to respond.  Poor historian.  No focal neurological deficits.  Moving extremities. skin: No obvious ecchymosis/lesions  psychiatry: Does not participate in any meaningful conversation; affect is very flat.  Data Reviewed: I have personally reviewed following labs and imaging studies  CBC: Recent Labs  Lab 03/05/21 0609 03/06/21 0353 03/07/21 0809 03/08/21 0115 03/09/21 0433  WBC  --   --  8.0 9.6 10.5  NEUTROABS  --   --  5.0 6.8 7.5  HGB 8.9* 8.8* 8.9* 8.6* 8.4*  HCT 27.1* 27.6* 26.9* 26.9* 27.5*  MCV  --   --  73.9* 75.4* 77.5*  PLT  --   --  314 322 161    Basic Metabolic Panel: Recent Labs  Lab 03/05/21 0609 03/06/21 0353 03/07/21 0809 03/08/21 0115 03/09/21 0433  NA 133* 134* 130* 136 140  K 3.5 3.0* 3.1* 3.9 4.5  CL 100 98 91* 98 104  CO2 20* 22 28 27 28   GLUCOSE 140* 142* 154* 158* 128*  BUN 88* 82* 74* 65* 53*  CREATININE 2.38* 1.93* 1.69* 1.46* 1.21  CALCIUM 7.7* 7.7* 7.4* 7.5* 7.8*  MG 1.8 1.9 1.8 1.7 1.9  PHOS 4.8* 4.6  --   --   --     GFR: Estimated Creatinine Clearance: 74 mL/min (by C-G formula based on SCr of 1.21 mg/dL). Liver Function Tests: Recent Labs  Lab 03/05/21 0609 03/06/21 0353  ALBUMIN 1.9* 1.9*    No results  for input(s): LIPASE, AMYLASE in the last 168 hours. No results for input(s): AMMONIA in the last 168 hours. Coagulation Profile: No results for input(s): INR, PROTIME in the last 168 hours. Cardiac Enzymes: No results for input(s): CKTOTAL, CKMB, CKMBINDEX, TROPONINI in the last 168 hours. BNP (last 3 results) No results for input(s): PROBNP in the last 8760 hours. HbA1C: No results for input(s): HGBA1C in the last 72 hours. CBG: Recent Labs  Lab 03/10/21 0619 03/10/21 1117 03/10/21 1715 03/10/21 2109 03/11/21 0631  GLUCAP 142* 138* 134* 135* 102*    Lipid Profile: No results for input(s): CHOL, HDL, LDLCALC, TRIG, CHOLHDL, LDLDIRECT in the last 72 hours. Thyroid Function Tests: No results for input(s): TSH, T4TOTAL, FREET4, T3FREE, THYROIDAB in the last 72 hours. Anemia Panel: No results for input(s): VITAMINB12, FOLATE, FERRITIN, TIBC, IRON, RETICCTPCT in the last 72 hours. Sepsis Labs: No results for input(s): PROCALCITON, LATICACIDVEN in the last 168 hours.   Recent Results (from the past 240 hour(s))  Resp Panel by RT-PCR (Flu A&B, Covid) Nasopharyngeal Swab     Status: None   Collection Time: 03/01/21  8:20 AM   Specimen: Nasopharyngeal Swab; Nasopharyngeal(NP) swabs in vial transport medium  Result Value Ref Range Status   SARS Coronavirus 2 by RT PCR NEGATIVE NEGATIVE Final    Comment: (NOTE) SARS-CoV-2 target nucleic acids are NOT DETECTED.  The SARS-CoV-2 RNA is generally detectable in upper respiratory specimens during the acute phase of infection. The lowest concentration of SARS-CoV-2 viral copies this assay can detect is 138 copies/mL. A negative result does not preclude SARS-Cov-2 infection and should not be used as the sole basis for treatment or other patient management decisions. A negative result may occur with  improper specimen collection/handling, submission of specimen other than nasopharyngeal swab, presence of viral mutation(s) within  the areas targeted by this assay, and inadequate number of viral copies(<138 copies/mL). A negative result must be combined with clinical observations, patient history, and epidemiological information. The expected result is Negative.  Fact Sheet for Patients:  EntrepreneurPulse.com.au  Fact Sheet for Healthcare Providers:  IncredibleEmployment.be  This test is no t yet approved or cleared by the [place] FDA and  has been authorized for detection and/or diagnosis of SARS-CoV-2 by FDA under an Emergency Use Authorization (EUA). This EUA will remain  in effect (meaning this test can be used) for the duration of the COVID-19 declaration under Section  564(b)(1) of the Act, 21 U.S.C.section 360bbb-3(b)(1), unless the authorization is terminated  or revoked sooner.       Influenza A by PCR NEGATIVE NEGATIVE Final   Influenza B by PCR NEGATIVE NEGATIVE Final    Comment: (NOTE) The Xpert Xpress SARS-CoV-2/FLU/RSV plus assay is intended as an aid in the diagnosis of influenza from Nasopharyngeal swab specimens and should not be used as a sole basis for treatment. Nasal washings and aspirates are unacceptable for Xpert Xpress SARS-CoV-2/FLU/RSV testing.  Fact Sheet for Patients: EntrepreneurPulse.com.au  Fact Sheet for Healthcare Providers: IncredibleEmployment.be  This test is not yet approved or cleared by the [place] FDA and has been authorized for detection and/or diagnosis of SARS-CoV-2 by FDA under an Emergency Use Authorization (EUA). This EUA will remain in effect (meaning this test can be used) for the duration of the COVID-19 declaration under Section 564(b)(1) of the Act, 21 U.S.C. section 360bbb-3(b)(1), unless the authorization is terminated or revoked.  Performed at [facility], [place] [address], [place], [place] [address]   Aerobic/Anaerobic Culture w Gram Stain (surgical/deep  wound)     Status: None   Collection Time: 03/01/21  1:05 PM   Specimen: Foot, Left; Wound  Result Value Ref Range Status   Specimen Description WOUND  Final   Special Requests LEFT FOOT SPEC A  Final   Gram Stain NO WBC SEEN NO ORGANISMS SEEN   Final   Culture   Final    FEW PROTEUS MIRABILIS NO ANAEROBES ISOLATED Performed at [facility], [address] [place], [place] [address]    Report Status 03/06/2021 FINAL  Final   Organism ID, Bacteria PROTEUS MIRABILIS  Final      Susceptibility   Proteus mirabilis - MIC*    AMPICILLIN >=32 RESISTANT Resistant     CEFAZOLIN >=64 RESISTANT Resistant     CEFEPIME <=0.12 SENSITIVE Sensitive     CEFTAZIDIME 2 SENSITIVE Sensitive     CEFTRIAXONE 1 SENSITIVE Sensitive     CIPROFLOXACIN <=0.25 SENSITIVE Sensitive     GENTAMICIN <=1 SENSITIVE Sensitive     IMIPENEM 2 SENSITIVE Sensitive     TRIMETH/SULFA <=20 SENSITIVE Sensitive     AMPICILLIN/SULBACTAM >=32 RESISTANT Resistant     PIP/TAZO <=4 SENSITIVE Sensitive     * FEW PROTEUS MIRABILIS  Blood Cultures x 2 sites     Status: None   Collection Time: 03/01/21  3:45 PM   Specimen: BLOOD LEFT HAND  Result Value Ref Range Status   Specimen Description BLOOD LEFT HAND  Final   Special Requests   Final    BOTTLES DRAWN AEROBIC AND ANAEROBIC Blood Culture adequate volume   Culture   Final    NO GROWTH 5 DAYS Performed at [facility], [address], [place], [place] [address]    Report Status 03/06/2021 FINAL  Final  Blood Cultures x 2 sites     Status: None   Collection Time: 03/01/21  3:53 PM   Specimen: BLOOD RIGHT ARM  Result Value Ref Range Status   Specimen Description BLOOD RIGHT ARM  Final   Special Requests   Final    BOTTLES DRAWN AEROBIC AND ANAEROBIC Blood Culture results may not be optimal due to an excessive volume of blood received in culture bottles   Culture   Final    NO GROWTH 5 DAYS Performed at [facility], [place] [address], [place],   [address]    Report Status  03/06/2021 FINAL  Final  Urine Culture     Status: None   Collection Time: 03/06/21  7:02 AM   Specimen: Urine, Catheterized  Result Value Ref Range Status   Specimen Description URINE, CATHETERIZED  Final   Special Requests NONE  Final   Culture   Final    NO GROWTH Performed at [facility], [address] [place], [place] [address]    Report Status 03/08/2021 FINAL  Final          Radiology Studies: No results found.      Scheduled Meds:  sodium chloride   Intravenous Once   amLODipine  5 mg Oral Daily   atorvastatin  80 mg Oral QHS   brimonidine  1 drop Both Eyes BID   Chlorhexidine Gluconate Cloth  6 each Topical Daily   dorzolamide  1 drop Both Eyes BID   heparin  5,000 Units Subcutaneous Q8H   insulin aspart  0-5 Units Subcutaneous QHS   insulin aspart  0-9 Units Subcutaneous TID WC   insulin glargine-yfgn  3 Units Subcutaneous Daily   multivitamin with minerals  1 tablet Oral Daily   pantoprazole  40 mg Oral Daily   pregabalin  75 mg Oral BID   saccharomyces boulardii  250 mg Oral BID   senna-docusate  1 tablet Oral Daily   sertraline  50 mg Oral QHS   tamsulosin  0.8 mg Oral Daily   Continuous Infusions:          Aline August, MD [facility] 03/11/2021, 7:44 AM

## 2021-03-11 NOTE — Progress Notes (Signed)
Foley catheter removed per MD order without difficulty.   

## 2021-03-12 DIAGNOSIS — H47092 Other disorders of optic nerve, not elsewhere classified, left eye: Secondary | ICD-10-CM | POA: Diagnosis not present

## 2021-03-12 DIAGNOSIS — Z7401 Bed confinement status: Secondary | ICD-10-CM | POA: Diagnosis not present

## 2021-03-12 DIAGNOSIS — E113512 Type 2 diabetes mellitus with proliferative diabetic retinopathy with macular edema, left eye: Secondary | ICD-10-CM | POA: Diagnosis not present

## 2021-03-12 DIAGNOSIS — M869 Osteomyelitis, unspecified: Secondary | ICD-10-CM | POA: Diagnosis not present

## 2021-03-12 DIAGNOSIS — I739 Peripheral vascular disease, unspecified: Secondary | ICD-10-CM | POA: Diagnosis not present

## 2021-03-12 DIAGNOSIS — E118 Type 2 diabetes mellitus with unspecified complications: Secondary | ICD-10-CM | POA: Diagnosis not present

## 2021-03-12 DIAGNOSIS — N179 Acute kidney failure, unspecified: Secondary | ICD-10-CM | POA: Diagnosis not present

## 2021-03-12 DIAGNOSIS — I5033 Acute on chronic diastolic (congestive) heart failure: Secondary | ICD-10-CM | POA: Diagnosis not present

## 2021-03-12 DIAGNOSIS — I130 Hypertensive heart and chronic kidney disease with heart failure and stage 1 through stage 4 chronic kidney disease, or unspecified chronic kidney disease: Secondary | ICD-10-CM | POA: Diagnosis not present

## 2021-03-12 DIAGNOSIS — M6281 Muscle weakness (generalized): Secondary | ICD-10-CM | POA: Diagnosis not present

## 2021-03-12 DIAGNOSIS — E1165 Type 2 diabetes mellitus with hyperglycemia: Secondary | ICD-10-CM | POA: Diagnosis not present

## 2021-03-12 DIAGNOSIS — L8990 Pressure ulcer of unspecified site, unspecified stage: Secondary | ICD-10-CM | POA: Diagnosis not present

## 2021-03-12 DIAGNOSIS — L89313 Pressure ulcer of right buttock, stage 3: Secondary | ICD-10-CM | POA: Diagnosis not present

## 2021-03-12 DIAGNOSIS — E119 Type 2 diabetes mellitus without complications: Secondary | ICD-10-CM | POA: Diagnosis not present

## 2021-03-12 DIAGNOSIS — H4311 Vitreous hemorrhage, right eye: Secondary | ICD-10-CM | POA: Diagnosis not present

## 2021-03-12 DIAGNOSIS — I5032 Chronic diastolic (congestive) heart failure: Secondary | ICD-10-CM | POA: Diagnosis not present

## 2021-03-12 DIAGNOSIS — N1831 Chronic kidney disease, stage 3a: Secondary | ICD-10-CM | POA: Diagnosis not present

## 2021-03-12 DIAGNOSIS — L089 Local infection of the skin and subcutaneous tissue, unspecified: Secondary | ICD-10-CM | POA: Diagnosis not present

## 2021-03-12 DIAGNOSIS — K5981 Ogilvie syndrome: Secondary | ICD-10-CM | POA: Diagnosis not present

## 2021-03-12 DIAGNOSIS — R338 Other retention of urine: Secondary | ICD-10-CM | POA: Diagnosis not present

## 2021-03-12 DIAGNOSIS — T8789 Other complications of amputation stump: Secondary | ICD-10-CM | POA: Diagnosis not present

## 2021-03-12 DIAGNOSIS — R69 Illness, unspecified: Secondary | ICD-10-CM | POA: Diagnosis not present

## 2021-03-12 DIAGNOSIS — L97821 Non-pressure chronic ulcer of other part of left lower leg limited to breakdown of skin: Secondary | ICD-10-CM | POA: Diagnosis not present

## 2021-03-12 DIAGNOSIS — R55 Syncope and collapse: Secondary | ICD-10-CM | POA: Diagnosis not present

## 2021-03-12 DIAGNOSIS — R404 Transient alteration of awareness: Secondary | ICD-10-CM | POA: Diagnosis not present

## 2021-03-12 DIAGNOSIS — M2550 Pain in unspecified joint: Secondary | ICD-10-CM | POA: Diagnosis not present

## 2021-03-12 DIAGNOSIS — Z89512 Acquired absence of left leg below knee: Secondary | ICD-10-CM | POA: Diagnosis not present

## 2021-03-12 DIAGNOSIS — M7989 Other specified soft tissue disorders: Secondary | ICD-10-CM | POA: Diagnosis not present

## 2021-03-12 DIAGNOSIS — Z79899 Other long term (current) drug therapy: Secondary | ICD-10-CM | POA: Diagnosis not present

## 2021-03-12 DIAGNOSIS — E11628 Type 2 diabetes mellitus with other skin complications: Secondary | ICD-10-CM | POA: Diagnosis not present

## 2021-03-12 DIAGNOSIS — H547 Unspecified visual loss: Secondary | ICD-10-CM | POA: Diagnosis not present

## 2021-03-12 DIAGNOSIS — E1122 Type 2 diabetes mellitus with diabetic chronic kidney disease: Secondary | ICD-10-CM | POA: Diagnosis not present

## 2021-03-12 DIAGNOSIS — H401124 Primary open-angle glaucoma, left eye, indeterminate stage: Secondary | ICD-10-CM | POA: Diagnosis not present

## 2021-03-12 DIAGNOSIS — R42 Dizziness and giddiness: Secondary | ICD-10-CM | POA: Diagnosis present

## 2021-03-12 DIAGNOSIS — E11622 Type 2 diabetes mellitus with other skin ulcer: Secondary | ICD-10-CM | POA: Diagnosis not present

## 2021-03-12 DIAGNOSIS — H35372 Puckering of macula, left eye: Secondary | ICD-10-CM | POA: Diagnosis not present

## 2021-03-12 DIAGNOSIS — N390 Urinary tract infection, site not specified: Secondary | ICD-10-CM | POA: Diagnosis not present

## 2021-03-12 DIAGNOSIS — H2512 Age-related nuclear cataract, left eye: Secondary | ICD-10-CM | POA: Diagnosis not present

## 2021-03-12 DIAGNOSIS — R279 Unspecified lack of coordination: Secondary | ICD-10-CM | POA: Diagnosis not present

## 2021-03-12 DIAGNOSIS — L89153 Pressure ulcer of sacral region, stage 3: Secondary | ICD-10-CM | POA: Diagnosis not present

## 2021-03-12 DIAGNOSIS — E876 Hypokalemia: Secondary | ICD-10-CM | POA: Diagnosis not present

## 2021-03-12 DIAGNOSIS — R41841 Cognitive communication deficit: Secondary | ICD-10-CM | POA: Diagnosis not present

## 2021-03-12 DIAGNOSIS — I10 Essential (primary) hypertension: Secondary | ICD-10-CM | POA: Diagnosis not present

## 2021-03-12 DIAGNOSIS — R52 Pain, unspecified: Secondary | ICD-10-CM | POA: Diagnosis not present

## 2021-03-12 DIAGNOSIS — R2231 Localized swelling, mass and lump, right upper limb: Secondary | ICD-10-CM | POA: Diagnosis not present

## 2021-03-12 DIAGNOSIS — H43812 Vitreous degeneration, left eye: Secondary | ICD-10-CM | POA: Diagnosis not present

## 2021-03-12 DIAGNOSIS — E11319 Type 2 diabetes mellitus with unspecified diabetic retinopathy without macular edema: Secondary | ICD-10-CM | POA: Diagnosis not present

## 2021-03-12 DIAGNOSIS — R269 Unspecified abnormalities of gait and mobility: Secondary | ICD-10-CM | POA: Diagnosis not present

## 2021-03-12 DIAGNOSIS — Z743 Need for continuous supervision: Secondary | ICD-10-CM | POA: Diagnosis not present

## 2021-03-12 DIAGNOSIS — H4051X3 Glaucoma secondary to other eye disorders, right eye, severe stage: Secondary | ICD-10-CM | POA: Diagnosis not present

## 2021-03-12 DIAGNOSIS — E113591 Type 2 diabetes mellitus with proliferative diabetic retinopathy without macular edema, right eye: Secondary | ICD-10-CM | POA: Diagnosis not present

## 2021-03-12 DIAGNOSIS — R1312 Dysphagia, oropharyngeal phase: Secondary | ICD-10-CM | POA: Diagnosis not present

## 2021-03-12 DIAGNOSIS — H401113 Primary open-angle glaucoma, right eye, severe stage: Secondary | ICD-10-CM | POA: Diagnosis not present

## 2021-03-12 DIAGNOSIS — L89893 Pressure ulcer of other site, stage 3: Secondary | ICD-10-CM | POA: Diagnosis not present

## 2021-03-12 DIAGNOSIS — F29 Unspecified psychosis not due to a substance or known physiological condition: Secondary | ICD-10-CM | POA: Diagnosis not present

## 2021-03-12 DIAGNOSIS — I5030 Unspecified diastolic (congestive) heart failure: Secondary | ICD-10-CM | POA: Diagnosis not present

## 2021-03-12 DIAGNOSIS — R278 Other lack of coordination: Secondary | ICD-10-CM | POA: Diagnosis not present

## 2021-03-12 DIAGNOSIS — R2689 Other abnormalities of gait and mobility: Secondary | ICD-10-CM | POA: Diagnosis not present

## 2021-03-12 LAB — GLUCOSE, CAPILLARY
Glucose-Capillary: 129 mg/dL — ABNORMAL HIGH (ref 70–99)
Glucose-Capillary: 159 mg/dL — ABNORMAL HIGH (ref 70–99)
Glucose-Capillary: 118 mg/dL — ABNORMAL HIGH (ref 70–99)
Glucose-Capillary: 124 mg/dL — ABNORMAL HIGH (ref 70–99)

## 2021-03-12 LAB — RESP PANEL BY RT-PCR (FLU A&B, COVID) ARPGX2
Influenza A by PCR: NEGATIVE
Influenza B by PCR: NEGATIVE
SARS Coronavirus 2 by RT PCR: NEGATIVE

## 2021-03-12 MED ORDER — AMLODIPINE BESYLATE 10 MG PO TABS: 5.0000 mg | ORAL_TABLET | Freq: Every day | ORAL | Status: DC

## 2021-03-12 MED ORDER — INSULIN GLARGINE-YFGN 100 UNIT/ML ~~LOC~~ SOPN: 3.0000 [IU] | PEN_INJECTOR | Freq: Every day | SUBCUTANEOUS | Status: DC

## 2021-03-12 NOTE — Progress Notes (Addendum)
°  Progress Note    03/12/2021 7:47 AM 5 Days Post-Op  Subjective:  sitting up in bed attempting to eat. Asking for assistance with is breakfast. Asks that I come back to look at his leg later so that his food does not get cold   Vitals:   03/11/21 1718 03/11/21 2215  BP: 125/78 139/85  Pulse: 81 79  Resp: 18   Temp: 98.5 F (36.9 C) 99.7 F (37.6 C)  SpO2: 99% 100%   Physical Exam: Cardiac:  regular Lungs:  non labored Incisions:  left BKA with Prevena VAC. Good seal. Shrinker in place and Ampushield Extremities:warm and well perfused Neurologic: alert  CBC    Component Value Date/Time   WBC 10.5 03/09/2021 0433   RBC 3.55 (L) 03/09/2021 0433   HGB 8.4 (L) 03/09/2021 0433   HGB 13.9 12/31/2013 1336   HCT 27.5 (L) 03/09/2021 0433   HCT 43.0 12/31/2013 1336   PLT 336 03/09/2021 0433   PLT 184 12/31/2013 1336   MCV 77.5 (L) 03/09/2021 0433   MCV 81 12/31/2013 1336   MCH 23.7 (L) 03/09/2021 0433   MCHC 30.5 03/09/2021 0433   RDW 15.7 (H) 03/09/2021 0433   RDW 14.4 12/31/2013 1336   LYMPHSABS 1.7 03/09/2021 0433   MONOABS 0.8 03/09/2021 0433   EOSABS 0.4 03/09/2021 0433   BASOSABS 0.0 03/09/2021 0433    BMET    Component Value Date/Time   NA 140 03/09/2021 0433   NA 135 (L) 12/31/2013 1336   K 4.5 03/09/2021 0433   K 3.8 12/31/2013 1336   CL 104 03/09/2021 0433   CL 101 12/31/2013 1336   CO2 28 03/09/2021 0433   CO2 25 12/31/2013 1336   GLUCOSE 128 (H) 03/09/2021 0433   GLUCOSE 249 (H) 12/31/2013 1336   BUN 53 (H) 03/09/2021 0433   BUN 8 12/31/2013 1336   CREATININE 1.21 03/09/2021 0433   CREATININE 0.88 12/31/2013 1336   CALCIUM 7.8 (L) 03/09/2021 0433   CALCIUM 8.6 12/31/2013 1336   GFRNONAA >60 03/09/2021 0433   GFRNONAA >60 12/31/2013 1336   GFRAA >60 01/29/2018 1705   GFRAA >60 12/31/2013 1336    INR No results found for: INR   Intake/Output Summary (Last 24 hours) at 03/12/2021 0747 Last data filed at 03/12/2021 0636 Gross per 24 hour   Intake 720 ml  Output 1000 ml  Net -280 ml     Assessment/Plan:  61 y.o. male is s/p Left BKA 5 Days Post-Op   Pain well controlled Pt is bed bound requiring full assist per PT/ OT with no therapy recs Continue stump stocking and Ampushield to L BKA Prevena VAC will stay on until 1/18 [facility]   Karoline Caldwell, [facility] [phone] 03/12/2021 7:47 AM  VASCULAR STAFF ADDENDUM: I have independently interviewed and examined the patient. I agree with the above.   [name]. Stanford Breed, MD [facility] Phone Number: [phone] 03/12/2021 10:30 AM

## 2021-03-12 NOTE — Progress Notes (Signed)
Patient ID: [name], male   DOB: [birth year], 61 y.o.   MRN: [id number]  PROGRESS NOTE    [name]  [id number]:[id number] DOB: 1961-01-04 DOA: 02/28/2021 PCP: Jodi Marble, MD   Brief Narrative:  61 year old M with past medical history of diabetes mellitus type 2, chronic diastolic CHF, CKD stage IIIa,  glaucoma, HTN, depression, bipolar disorder, blindness and debility/bedbound at baseline brought to ED with diabetic left foot wound infection.  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  Started on IV vancomycin, ceftriaxone and Flagyl.  Podiatry and vascular surgery consulted.  Patient underwent left TMA amputation by Dr. Jacqualyn Posey on 03/01/2021.  ABI with noncompressible RLE arteries and moderate LLE PAD.  Underwent LLE angiography with no visible pedal circulation to revascularize.  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  Palliative medicine consulted and following.  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 -ABI with noncompressible RLE arteries and moderate LLE PAD.  Underwent LLE angiography with no visible pedal circulation to revascularize.   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  Wound care as per vascular surgery recommendations. -Continue aspirin and statin.  AKI on CKD stage IIIa Acute metabolic acidosis -Creatinine improving.  1.21 on 03/09/2021.  No labs today.  Acidosis has resolved.  DC'd IV fluids on 03/09/2021.    Acute urinary retention -Status post Foley catheter placement on 03/05/2021.  S/P removal on 03/11/21.  Continue Flomax  Visual hallucinations/history of bipolar disorder/depression -Unclear cause.  Continue home Zoloft.  Patient has been started  on low-dose Seroquel at night.  Since then, he has been very sleepy during the daytime. Dc'd Seroquel on 03/10/2021.  Fall precautions.  Delirium precautions.  If symptoms do not improve, will need psychiatry evaluation  Chronic diastolic CHF -Currently euvolemic.  Demadex and Entresto on hold because of AKI.  BiDil discontinued due to hypotension.  Strict input and output.  Daily weights.  Diabetes mellitus type 2 uncontrolled with hyperglycemia -A1c 8.4.  Continue long-acting insulin along with CBGs with SSI  Hypotension History of essential hypertension -Blood pressure currently stable.  Continue amlodipine, low-dose.  Entresto, Demadex and BiDil on hold.  Hyponatremia -Improved.   Hypokalemia -Improved.   Anemia of chronic disease -Status post 1 unit packed red cell transfusion on 03/03/2021.  Transfuse if hemoglobin is less than 7.  Hemoglobin 8.4 on 03/09/2021.  Monitor intermittently.  Glucoma/blindness -Continue dorzolamide, brimonidine  Debility -Reportedly bedbound at baseline.  From SNF.  Goals of care -Remains full code.Overall prognosis is poor.  Palliative care following intermittently.  Obesity -Outpatient follow-up  DVT prophylaxis: Heparin subcutaneous Code Status: Full Family Communication: None at bedside Disposition Plan: Status is: Inpatient  Remains inpatient appropriate because: Of need for SNF placement.  Currently medically stable for discharge.  Consultants: Podiatry/vascular surgery/palliative care  Procedures:  03/01/2021-left transmetatarsal amputation by Dr. Jacqualyn Posey 03/02/2021-LLE angiogram with no visible pedal circulation  Antimicrobials:  Anti-infectives (From admission, onward)    Start     Dose/Rate Route Frequency Ordered Stop   03/07/21 0830  cefTRIAXone (ROCEPHIN) 2 g in sodium chloride 0.9 % 100 mL IVPB  Status:  Discontinued        2 g 200 mL/hr over 30 Minutes Intravenous Every 24 hours  03/07/21 0744 03/07/21 0744   03/07/21 0830   cefTRIAXone (ROCEPHIN) 2 g in sodium chloride 0.9 % 100 mL IVPB  Status:  Discontinued        2 g 200 mL/hr over 30 Minutes Intravenous Every 24 hours 03/07/21 0744 03/08/21 1026   03/02/21 1000  linezolid (ZYVOX) IVPB 600 mg  Status:  Discontinued        600 mg 300 mL/hr over 60 Minutes Intravenous Every 12 hours 03/02/21 0717 03/03/21 1218   03/01/21 1600  cefTRIAXone (ROCEPHIN) 2 g in sodium chloride 0.9 % 100 mL IVPB        2 g 200 mL/hr over 30 Minutes Intravenous Every 24 hours 03/01/21 1507 03/07/21 2159   03/01/21 1600  metroNIDAZOLE (FLAGYL) IVPB 500 mg  Status:  Discontinued        500 mg 100 mL/hr over 60 Minutes Intravenous Every 8 hours 03/01/21 1507 03/03/21 1218   03/01/21 1529  vancomycin variable dose per unstable renal function (pharmacist dosing)  Status:  Discontinued         Does not apply See admin instructions 03/01/21 1529 03/02/21 0717   03/01/21 0015  vancomycin (VANCOREADY) IVPB 2000 mg/400 mL        2,000 mg 200 mL/hr over 120 Minutes Intravenous  Once 03/01/21 0001 03/01/21 0725   03/01/21 0015  clindamycin (CLEOCIN) IVPB 900 mg        900 mg 100 mL/hr over 30 Minutes Intravenous  Once 03/01/21 0010 03/01/21 0104   03/01/21 0015  piperacillin-tazobactam (ZOSYN) IVPB 3.375 g        3.375 g 100 mL/hr over 30 Minutes Intravenous  Once 03/01/21 0012 03/01/21 0216        Subjective: Patient seen and examined at bedside.  Poor historian.  No overnight agitation, fever, vomiting reported.   Objective: Vitals:   03/11/21 1049 03/11/21 1718 03/11/21 2215 03/12/21 0500  BP: (!) 143/95 125/78 139/85   Pulse: 79 81 79   Resp: 17 18    Temp: 98.7 F (37.1 C) 98.5 F (36.9 C) 99.7 F (37.6 C)   TempSrc: Axillary Oral Oral   SpO2: 99% 99% 100%   Weight:    97.3 kg  Height:        Intake/Output Summary (Last 24 hours) at 03/12/2021 0747 Last data filed at 03/12/2021 0636 Gross per 24 hour  Intake 720 ml  Output 1000 ml  Net -280 ml    Filed Weights    03/09/21 0424 03/10/21 0500 03/12/21 0500  Weight: 102 kg 96.4 kg 97.3 kg    Examination:  General exam: On room air currently.  No distress.  Looks chronically ill and deconditioned.  Extremely poor historian and hardly participates in any conversation. Respiratory system: Decreased breath sounds at bases bilaterally with some crackles  cardiovascular system: Rate controlled; S1-S2 heard gastrointestinal system: Abdomen is distended slightly; soft and nontender.  Bowel sounds are heard  extremities: Trace right lower extremity edema present; no cyanosis.  Left BKA with dressing present    Data Reviewed: I have personally reviewed following labs and imaging studies  CBC: Recent Labs  Lab 03/06/21 0353 03/07/21 0809 03/08/21 0115 03/09/21 0433  WBC  --  8.0 9.6 10.5  NEUTROABS  --  5.0 6.8 7.5  HGB 8.8* 8.9* 8.6* 8.4*  HCT 27.6* 26.9* 26.9* 27.5*  MCV  --  73.9* 75.4* 77.5*  PLT  --  314 322 675    Basic Metabolic Panel: Recent Labs  Lab 03/06/21 0353 03/07/21 0809 03/08/21 0115 03/09/21 0433  NA 134* 130* 136 140  K 3.0* 3.1* 3.9 4.5  CL 98 91* 98 104  CO2 22 28 27 28   GLUCOSE 142* 154* 158* 128*  BUN 82* 74* 65* 53*  CREATININE 1.93* 1.69* 1.46* 1.21  CALCIUM 7.7* 7.4* 7.5* 7.8*  MG 1.9 1.8 1.7 1.9  PHOS 4.6  --   --   --     GFR: Estimated Creatinine Clearance: 74.4 mL/min (by C-G formula based on SCr of 1.21 mg/dL). Liver Function Tests: Recent Labs  Lab 03/06/21 0353  ALBUMIN 1.9*    No results for input(s): LIPASE, AMYLASE in the last 168 hours. No results for input(s): AMMONIA in the last 168 hours. Coagulation Profile: No results for input(s): INR, PROTIME in the last 168 hours. Cardiac Enzymes: No results for input(s): CKTOTAL, CKMB, CKMBINDEX, TROPONINI in the last 168 hours. BNP (last 3 results) No results for input(s): PROBNP in the last 8760 hours. HbA1C: No results for input(s): HGBA1C in the last 72 hours. CBG: Recent Labs  Lab  03/11/21 0631 03/11/21 1048 03/11/21 1716 03/11/21 2029 03/12/21 0622  GLUCAP 102* 142* 164* 141* 129*    Lipid Profile: No results for input(s): CHOL, HDL, LDLCALC, TRIG, CHOLHDL, LDLDIRECT in the last 72 hours. Thyroid Function Tests: No results for input(s): TSH, T4TOTAL, FREET4, T3FREE, THYROIDAB in the last 72 hours. Anemia Panel: No results for input(s): VITAMINB12, FOLATE, FERRITIN, TIBC, IRON, RETICCTPCT in the last 72 hours. Sepsis Labs: No results for input(s): PROCALCITON, LATICACIDVEN in the last 168 hours.   Recent Results (from the past 240 hour(s))  Urine Culture     Status: None   Collection Time: 03/06/21  7:02 AM   Specimen: Urine, Catheterized  Result Value Ref Range Status   Specimen Description URINE, CATHETERIZED  Final   Special Requests NONE  Final   Culture   Final    NO GROWTH Performed at [facility], [address] [place], [place] [address]    Report Status 03/08/2021 FINAL  Final          Radiology Studies: No results found.      Scheduled Meds:  sodium chloride   Intravenous Once   amLODipine  5 mg Oral Daily   atorvastatin  80 mg Oral QHS   brimonidine  1 drop Both Eyes BID   Chlorhexidine Gluconate Cloth  6 each Topical Daily   dorzolamide  1 drop Both Eyes BID   heparin  5,000 Units Subcutaneous Q8H   insulin aspart  0-5 Units Subcutaneous QHS   insulin aspart  0-9 Units Subcutaneous TID WC   insulin glargine-yfgn  3 Units Subcutaneous Daily   multivitamin with minerals  1 tablet Oral Daily   pantoprazole  40 mg Oral Daily   pregabalin  75 mg Oral BID   saccharomyces boulardii  250 mg Oral BID   senna-docusate  1 tablet Oral Daily   sertraline  50 mg Oral QHS   tamsulosin  0.8 mg Oral Daily   Continuous Infusions:          Aline August, MD [facility] 03/12/2021, 7:47 AM

## 2021-03-12 NOTE — TOC Transition Note (Signed)
Transition of Care [facility]) - CM/SW Discharge Note   Patient Details  Name: [name] MRN: [id number] Date of Birth: 03-28-1960  Transition of Care [facility]) CM/SW Contact:  Coralee Pesa, [place] Phone Number: 03/12/2021, 4:12 PM   Clinical Narrative:    Pt to be transported to [facility] via [place]. Nurse to call report to [phone].   Final next level of care: [place] Barriers to Discharge: Barriers Resolved   Patient Goals and CMS Choice        Discharge Placement              Patient chooses bed at:  [facility]) Patient to be transferred to facility by: [place] Name of family member notified: [name] Patient and family notified of of transfer: 03/12/21  Discharge Plan and Services In-house Referral: Clinical Social Work                                   Social Determinants of Health ([place]) Interventions     Readmission Risk Interventions Readmission Risk Prevention Plan 01/12/2020  Transportation Screening Complete  Palliative Care Screening Not Applicable  Some recent data might be hidden

## 2021-03-12 NOTE — Progress Notes (Signed)
Pt to be discharged from 4E to [facility] via [place]. D/c paperwork and medication information sent to facility. IV and tele removed. Report called to nurse Loma Sousa at facility.  Raelyn Number, RN

## 2021-03-12 NOTE — Discharge Instructions (Signed)
[facility]  Discharge instructions  Lower Extremity Amputation  Please refer to the following instruction for your post-procedure care. Your surgeon or physician assistant will discuss any changes with you.  Activity  You are encouraged to walk as much as you can. You can slowly return to normal activities during the month after your surgery. Avoid strenuous activity and heavy lifting until your doctor tells you it's OK. Avoid activities such as vacuuming or swinging a golf club. Do not drive until your doctor give the OK and you are no longer taking prescription pain medications. It is also normal to have difficulty with sleep habits, eating and bowel movement after surgery. These will go away with time.  Bathing/Showering  Shower daily after you go home. Do not soak in a bathtub, hot tub, or swim until the incision heals completely.  Incision Care  Clean your incision with mild soap and water. Shower every day. Pat the area dry with a clean towel. You do not need a bandage unless otherwise instructed. Do not apply any ointments or creams to your incision. If you have open wounds you will be instructed how to care for them or a visiting nurse may be arranged for you. If you have staples or sutures along your incision they will be removed at your post-op appointment. You may have skin glue on your incision. Do not peel it off. It will come off on its own in about one week.  Diet  Resume your normal diet. There are no special food restrictions following this procedure. A low fat/ low cholesterol diet is recommended for all patients with vascular disease. In order to heal from your surgery, it is CRITICAL to get adequate nutrition. Your body requires vitamins, minerals, and protein. Vegetables are the best source of vitamins and minerals. Vegetables also provide the perfect balance of protein. Processed food has little nutritional value, so try to avoid  this.  Medications  Resume taking all your medications unless your doctor or physician assistant tells you not to. If your incision is causing pain, you may take over-the-counter pain relievers such as acetaminophen (Tylenol). If you were prescribed a stronger pain medication, please aware these medication can cause nausea and constipation. Prevent nausea by taking the medication with a snack or meal. Avoid constipation by drinking plenty of fluids and eating foods with high amount of fiber, such as fruits, vegetables, and grains. Take Colace 100 mg (an over-the-counter stool softener) twice a day as needed for constipation.  Do not take Tylenol if you are taking prescription pain medications.  Follow Up  Our office will schedule a follow up appointment 4 weeks following discharge.  Please call us immediately for any of the following conditions  Increase pain, redness, warmth, or drainage (pus) from your incision site(s) Fever of 101 degree or higher The swelling in your leg with the amputation suddenly worsens and becomes more painful than when you were in the hospital  Leg swelling is common after amputation surgery.  The swelling should improve over a few months following surgery. To improve the swelling, you may elevate your legs above the level of your heart while you are sitting or resting. Your surgeon or physician assistant may ask you to apply an ACE wrap or wear compression (TED) stockings to help to reduce swelling.  Reduce your risk of vascular disease  Stop smoking. If you would like help call [facility] at 1-800-QUIT-NOW (1-800-784-8669) or [place] at 336-586-4000.  Manage   your cholesterol Maintain a desired weight Control your diabetes weight Control your diabetes Keep your blood pressure down  If you have any questions, please call the office at [phone]  [name] will need daily dressing changes to left BKA. Wash with mild soap and water or wound cleanser.  Can then apply Kerlix wrap and ACE followed by stump stocking and then Amp[place]ield to keep leg protected. Once no further drainage from staple line can just place stump stocking and Amp[place]ield protector.

## 2021-03-12 NOTE — Discharge Summary (Signed)
Physician Discharge Summary  [name] ERD:[id number] DOB: Apr 25, 1960 DOA: 02/28/2021  PCP: Jodi Marble, MD  Admit date: 02/28/2021 Discharge date: 03/12/2021  Admitted From: SNF Disposition: SNF  Recommendations for Outpatient Follow-up:  Follow up with SNF provider at earliest convenience with repeat CBC/BMP in the next few days Outpatient follow-up with vascular surgery.  Wound/wound VAC care as per vascular surgery  Outpatient follow-up with cardiology Consider outpatient evaluation and follow-up by palliative care follow up in ED if symptoms worsen or new appear   Home Health: No Equipment/Devices: None  Discharge Condition: Guarded CODE STATUS: Full Diet recommendation: Heart healthy/carb modified/fluid restriction of up to 1500 cc a day  Brief/Interim Summary: 61 year old M with past medical history of diabetes mellitus type 2, chronic diastolic CHF, CKD stage IIIa,  glaucoma, HTN, depression, bipolar disorder, blindness and debility/bedbound at baseline brought to ED with diabetic left foot wound infection.  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  Started on IV vancomycin, ceftriaxone and Flagyl.  Podiatry and vascular surgery consulted.  Patient underwent left TMA amputation by Dr. Jacqualyn Posey on 03/01/2021.  ABI with noncompressible RLE arteries and moderate LLE PAD.  Underwent LLE angiography with no visible pedal circulation to revascularize.  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  Palliative medicine consulted and following.  He is currently hemodynamically stable.  PT recommended SNF placement.  He will be discharged to SNF once bed is available.  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 -ABI with noncompressible RLE arteries and moderate LLE PAD.  Underwent LLE angiography with no visible pedal circulation to revascularize.   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  Wound/wound [facility] care as per vascular surgery recommendations.  Follow-up with vascular surgery as an outpatient. -Continue aspirin and statin.   AKI on CKD stage IIIa Acute metabolic acidosis -Creatinine improving.  1.21 on 03/09/2021.  No labs today.  Acidosis has resolved.  DC'd IV fluids on 03/09/2021.  Outpatient follow-up of BMP.   Acute urinary retention -Status post Foley catheter placement on 03/05/2021.  S/P removal on 03/11/21.  Continue Flomax   Visual hallucinations/history of bipolar disorder/depression -Unclear cause.  Continue home Zoloft.  Patient has been started on low-dose Seroquel at night.  Since then, he has been very sleepy during the daytime. Dc'd Seroquel on 03/10/2021.   -Outpatient follow-up with psychiatry  Chronic diastolic CHF -Currently euvolemic.  Demadex and Entresto on hold because of AKI.  BiDil discontinued due to hypotension.   -Continue fluid and diet restriction.  Outpatient follow-up with cardiology.  Diabetes mellitus type 2 uncontrolled with hyperglycemia -A1c 8.4.  Continue low-dose long-acting insulin.  Carb modified diet.  Hypotension History of essential hypertension -Blood pressure currently stable.  Continue amlodipine, low-dose.  Entresto, Demadex and BiDil on hold.   Hyponatremia -Improved.    Hypokalemia -Improved.   Anemia of chronic disease -Status post 1 unit packed red cell transfusion on 03/03/2021.  Transfuse if hemoglobin is less than 7.  Hemoglobin 8.4 on 03/09/2021.  Follow-up as an outpatient.  Glucoma/blindness -Continue dorzolamide, brimonidine   Debility -Reportedly bedbound at baseline.  From SNF.   Goals of care -Remains full code.Overall prognosis  is poor.  Palliative  care following intermittently.  Outpatient follow-up with palliative care.   Obesity -Outpatient follow-up  Discharge Instructions  Discharge Instructions     Diet - low sodium heart healthy   Complete by: As directed    Diet Carb Modified   Complete by: As directed    Discharge wound care:   Complete by: As directed    Wound care/wound VAC care as per vascular surgery recommendations   Increase activity slowly   Complete by: As directed       Allergies as of 03/12/2021       Reactions   Other Anaphylaxis, Itching, Swelling, Other (See Comments)   Allergy to GRITS- exact ingredient is not known        Medication List     STOP taking these medications    empagliflozin 25 MG Tabs tablet Commonly known as: JARDIANCE   Entresto 49-51 MG Generic drug: sacubitril-valsartan   insulin lispro 100 UNIT/ML KwikPen Commonly known as: HUMALOG   isosorbide-hydrALAZINE 20-37.5 MG tablet Commonly known as: BIDIL   loperamide 2 MG capsule Commonly known as: IMODIUM   potassium chloride 10 MEQ tablet Commonly known as: KLOR-CON M   torsemide 20 MG tablet Commonly known as: DEMADEX       TAKE these medications    acetaminophen 650 MG CR tablet Commonly known as: TYLENOL Take 650 mg by mouth every 4 (four) hours as needed (for general discomfort).   amLODipine 10 MG tablet Commonly known as: NORVASC Take 0.5 tablets (5 mg total) by mouth daily. What changed: how much to take   aspirin 81 MG chewable tablet Chew 81 mg by mouth daily.   atorvastatin 80 MG tablet Commonly known as: LIPITOR Take 80 mg by mouth at bedtime.   brimonidine 0.2 % ophthalmic solution Commonly known as: ALPHAGAN Place 1 drop into both eyes in the morning and at bedtime.   [place] Moist Barrier/Zinc Crea Apply 1 application topically See admin instructions. Apply to bilateral shin, heels,left lower buttock skin tear. Place foam dressing over bilateral shins  every day and night shift for wound care   dorzolamide 2 % ophthalmic solution Commonly known as: TRUSOPT Place 1 drop into both eyes 2 (two) times daily.   ferrous sulfate 325 (65 FE) MG tablet Take 1 tablet (325 mg total) by mouth daily.   insulin glargine-yfgn 100 UNIT/ML Pen Commonly known as: SEMGLEE Inject 3 Units into the skin daily. What changed:  how much to take when to take this   multivitamin tablet Take 1 tablet by mouth daily.   ondansetron 4 MG tablet Commonly known as: ZOFRAN Take 4 mg by mouth every 4 (four) hours as needed for nausea or vomiting.   pantoprazole 40 MG tablet Commonly known as: PROTONIX Take 40 mg by mouth daily.   pregabalin 75 MG capsule Commonly known as: LYRICA Take 75 mg by mouth 2 (two) times daily.   saccharomyces boulardii 250 MG capsule Commonly known as: FLORASTOR Take 1 capsule (250 mg total) by mouth 2 (two) times daily.   senna-docusate 8.6-50 MG tablet Commonly known as: Senokot-S Take 1 tablet by mouth daily.   sertraline 50 MG tablet Commonly known as: ZOLOFT Take 50 mg by mouth at bedtime.   tamsulosin 0.4 MG Caps capsule Commonly known as: FLOMAX Take 0.8 mg by mouth daily.   Vitamin D (Ergocalciferol) 1.25 MG (50000 UNIT) Caps capsule Commonly known as: DRISDOL Take 50,000 Units by mouth every 30 (thirty) days. 17th of each month  Discharge Care Instructions  (From admission, onward)           Start     Ordered   03/12/21 0000  Discharge wound care:       Comments: Wound care/wound VAC care as per vascular surgery recommendations   03/12/21 1154            Allergies  Allergen Reactions   Other Anaphylaxis, Itching, Swelling and Other (See Comments)    Allergy to GRITS- exact ingredient is not known    Consultations: Podiatry/vascular surgery/palliative care   Procedures/Studies: CT Foot Left Wo Contrast  Result Date: 03/01/2021 CLINICAL DATA:  Left foot infection.  EXAM: CT OF THE LEFT FOOT WITHOUT CONTRAST TECHNIQUE: Multidetector CT imaging of the left foot was performed according to the standard protocol. Multiplanar CT image reconstructions were also generated. COMPARISON:  MRI left foot from same day. Left foot x-rays from yesterday. FINDINGS: Bones/Joint/Cartilage Small foci of gas within the third distal phalanx and fourth middle and distal phalanges. No fracture or dislocation. Mild degenerative changes. No joint effusion. Osteopenia. Ligaments Ligaments are suboptimally evaluated by CT. Muscles and Tendons Grossly intact. Soft tissue Subcutaneous emphysema about the third and fourth toes. Mild diffuse soft tissue swelling. No fluid collection or hematoma. No soft tissue mass. IMPRESSION: 1. 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 2. Third and fourth toe gangrene.  No abscess. Electronically Signed   By: Titus Dubin M.D.   On: 03/01/2021 08:12   MR FOOT LEFT WO CONTRAST  Result Date: 03/01/2021 CLINICAL DATA:  Foot osteomyelitis suspected.  Diabetes type 2, CKD. EXAM: MRI OF THE LEFT FOOT WITHOUT CONTRAST TECHNIQUE: Multiplanar, multisequence MR imaging of the left was performed. No intravenous contrast was administered. COMPARISON:  Radiographs dated February 28, 2021 FINDINGS: Bones/Joint/Cartilage There is heterogeneity of the marrow of the proximal phalanx of the first digit as well as proximal and middle phalanges of the second and third digits. No corresponding signal abnormality on T1 or evidence of deep skin wound. Ligaments Grossly intact. Muscles and Tendons There is markedly increased intrasubstance signal of the plantar muscles without evidence of drainable fluid collection or abscess on this noncontrast enhanced examination. 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 Soft tissues Mild subcutaneous soft tissue edema about the third and fourth  proximal phalanges. IMPRESSION: 1. No definite evidence of osteomyelitis. Heterogeneous marrow signal about the proximal phalanx of the first digit as well as proximal and middle phalanges of the second and third digits. Which in the absence of adjacent deep skin wound is a nonspecific finding and could be secondary to altered mechanics. 2. 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 No drainable fluid collection or abscess on this noncontrast enhanced examination. Electronically Signed   By: Keane Police D.O.   On: 03/01/2021 08:30   PERIPHERAL VASCULAR CATHETERIZATION  Result Date: 03/02/2021 DATE OF SERVICE: 03/02/2021  PATIENT:  [name]  61 y.o. male  PRE-OPERATIVE DIAGNOSIS:  Atherosclerosis of native arteries of left lower extremity; necrotizing left foot infection s/p TMA  POST-OPERATIVE DIAGNOSIS:  Same  PROCEDURE:  1) US guided right common femoral artery access 2) Aortogram 3) Left lower extremity angiogram with third order cannulation (52mL total contrast) 4) Conscious sedation (35 minutes)  SURGEON:  [name]. Stanford Breed, MD  ASSISTANT: none  ANESTHESIA:   local  and IV sedation  ESTIMATED BLOOD LOSS: minimal  LOCAL MEDICATIONS USED:  LIDOCAINE  COUNTS: confirmed correct.  PATIENT DISPOSITION:  PACU - hemodynamically stable.  Delay start of Pharmacological VTE agent (>24hrs) due to surgical blood loss or risk of bleeding: no  INDICATION FOR PROCEDURE: HITOSHI WERTS is a 61 y.o. male with severe peripheral arterial disease by noninvasive testing.  He presented to the [facility] ER with a necrotizing soft tissue infection of his foot.  He was taken urgently to the operating room by the podiatry team for transmetatarsal amputation. After careful discussion of risks, benefits, and alternatives the patient was offered angiography. The patient  understood and wished to proceed.   OPERATIVE FINDINGS: Terminal aorta and iliac arteries: Widely patent without flow-limiting stenosis  Left lower extremity: Common femoral artery: Widely patent without flow-limiting stenosis Profunda femoris artery: Widely patent without flow-limiting stenosis Superficial femoral artery: Widely patent without flow-limiting stenosis Popliteal artery: Widely patent without flow-limiting stenosis Anterior tibial artery: Patent at its origin, occludes in the proximal calf Tibioperoneal trunk: patent Peroneal artery: Patent at its origin, occludes in the proximal calf; the small island reconstitutes in the distal calf, but then occludes again. Posterior tibial artery: Patent at its origin, occludes in the proximal calf Pedal circulation: Virtually no flow seen angiographically  DESCRIPTION OF PROCEDURE: After identification of the patient in the pre-operative holding area, the patient was transferred to the operating room. The patient was positioned supine on the operating room table.  Anesthesia was induced. The groins was prepped and draped in standard fashion. A surgical pause was performed confirming correct patient, procedure, and operative location.  The right groin was anesthetized with subcutaneous injection of 1% lidocaine. Using ultrasound guidance, the right common femoral artery was accessed with micropuncture technique. Fluoroscopy was used to confirm cannulation over the femoral head. The 59F sheath was upsized to 72F.  A Benson wire was advanced into the distal aorta. Over the wire an omni flush catheter was advanced to the level of L2. Aortogram was performed - see above for details.  The left common iliac artery was selected with a Glidewire guidewire. The wire was advanced into the common femoral artery. Over the wire the omni flush catheter was advanced into the external iliac artery. Selective angiography was performed - see above for details.  The Glidewire was then navigated into the popliteal artery  and a straight flush catheter advanced over the wire into the popliteal artery to improve visualization of the tibial vessels and allow dilute contrast to be used to best visualize the problem area.  See above for findings.  The sheath was left in place to be removed in the recovery area.  Conscious sedation was administered with the use of IV fentanyl and midazolam under continuous physician and nurse monitoring.  Heart rate, blood pressure, and oxygen saturation were continuously monitored.  Total sedation time was 35 minutes  Upon completion of the case instrument and sharps counts were confirmed correct. The patient was transferred to the PACU in good condition. I was present for all portions of the procedure.  PLAN: No options for revascularization.  Needs left below-knee amputation.  Will discuss with family.  [name]. Stanford Breed, MD [facility] Phone Number: [phone] 03/02/2021 2:37 PM   DG Foot Complete Left  Result Date: 03/01/2021 CLINICAL DATA:  Postop, wound check. EXAM: LEFT FOOT - COMPLETE 3+ VIEW COMPARISON:  None. FINDINGS: Status post transmetatarsal amputation. Surgical drain in  place. Diffuse osteopenia. Prominent vascular calcifications. IMPRESSION: Status post transmetatarsal amputation of the forefoot with surgical drain in place. Electronically Signed   By: Keane Police D.O.   On: 03/01/2021 16:13   DG Foot Complete Left  Result Date: 02/28/2021 CLINICAL DATA:  Right left foot wound check, rule out gas formation. EXAM: LEFT FOOT - COMPLETE 3+ VIEW COMPARISON:  None. FINDINGS: Evaluation of the third and fourth digits is limited due to overlying bandage material. No acute fracture or dislocation. Gas is identified the soft tissues adjacent about the third digit and head of the third metatarsal. There are questionable erosions along the medial aspect of the head of the fifth metatarsal. Mild calcaneal spurring is present. Vascular calcifications are  noted in the soft tissues. Degenerative changes are noted in the midfoot and ankle. IMPRESSION: 1. Gas in the soft tissues about the third digit and head of the third metatarsal, compatible with history of infection. The possibility of necrotizing fasciitis can not be excluded. 2. Lucency at the head of the fifth metatarsal. The possibility of underlying osteomyelitis can not be excluded. MRI or three-phase bone scan is recommended for further evaluation. Electronically Signed   By: Brett Fairy M.D.   On: 02/28/2021 23:51   VAS [place] ABI WITH/WO TBI  Result Date: 03/01/2021  LOWER EXTREMITY DOPPLER STUDY Patient Name:  CAMARION WEIER  Date of Exam:   03/01/2021 Medical Rec #: [id number]      Accession #:    [id number] Date of Birth: 1960-04-13       Patient Gender: M Patient Age:   61 years Exam Location:  [name] Procedure:      VAS [place] ABI WITH/WO TBI Referring Phys: Celesta Gentile --------------------------------------------------------------------------------  Indications: Ulceration, and possible necrotizing fasciitis. High Risk Factors: Hypertension, Diabetes. Other Factors: Legally blind, CHF.  Comparison Study: No prior study on file Performing Technologist: Sharion Dove RVS  Examination Guidelines: A complete evaluation includes at minimum, Doppler waveform signals and systolic blood pressure reading at the level of bilateral brachial, anterior tibial, and posterior tibial arteries, when vessel segments are accessible. Bilateral testing is considered an integral part of a complete examination. Photoelectric Plethysmograph (PPG) waveforms and toe systolic pressure readings are included as required and additional duplex testing as needed. Limited examinations for reoccurring indications may be performed as noted.  ABI Findings: +---------+------------------+-----+----------+--------+  Right     Rt Pressure (mmHg) Index Waveform   Comment   +---------+------------------+-----+----------+--------+   Brachial  151                      triphasic            +---------+------------------+-----+----------+--------+  PTA       254                1.68  monophasic           +---------+------------------+-----+----------+--------+  DP        179                1.19  monophasic           +---------+------------------+-----+----------+--------+  Great Toe 46                 0.30                       +---------+------------------+-----+----------+--------+ +---------+------------------+-----+----------+-------+  Left      Lt Pressure (mmHg) Index Waveform   Comment  +---------+------------------+-----+----------+-------+  Brachial  134                      triphasic           +---------+------------------+-----+----------+-------+  PTA       117                0.77  monophasic          +---------+------------------+-----+----------+-------+  DP        117                0.77  monophasic          +---------+------------------+-----+----------+-------+  Great Toe 19                 0.13                      +---------+------------------+-----+----------+-------+ +-------+-----------+-----------+------------+------------+  ABI/TBI Today's ABI Today's TBI Previous ABI Previous TBI  +-------+-----------+-----------+------------+------------+  Right   1.68        0.30                                   +-------+-----------+-----------+------------+------------+  Left    0.77        0.13                                   +-------+-----------+-----------+------------+------------+  Summary: Right: Resting right ankle-brachial index indicates noncompressible right lower extremity arteries. The right toe-brachial index is abnormal. Left: Resting left ankle-brachial index indicates moderate left lower extremity arterial disease. The left toe-brachial index is abnormal.  *See table(s) above for measurements and observations.  Electronically signed by Jamelle Haring on 03/01/2021 at 3:58:12 PM.    Final       Subjective: Patient  seen and examined at bedside.  Poor historian.  No overnight agitation, fever, vomiting reported.   Discharge Exam: Vitals:   03/11/21 1718 03/11/21 2215  BP: 125/78 139/85  Pulse: 81 79  Resp: 18   Temp: 98.5 F (36.9 C) 99.7 F (37.6 C)  SpO2: 99% 100%    General exam: On room air currently.  No distress.  Looks chronically ill and deconditioned.  Extremely poor historian and hardly participates in any conversation. Respiratory system: Decreased breath sounds at bases bilaterally with some crackles  cardiovascular system: Rate controlled; S1-S2 heard  gastrointestinal system: Abdomen is distended slightly; soft and nontender.  Bowel sounds are heard  extremities: Trace right lower extremity edema present; no cyanosis.  Left BKA with dressing present       The results of significant diagnostics from this hospitalization (including imaging, microbiology, ancillary and laboratory) are listed below for reference.     Microbiology: Recent Results (from the past 240 hour(s))  Urine Culture     Status: None   Collection Time: 03/06/21  7:02 AM   Specimen: Urine, Catheterized  Result Value Ref Range Status   Specimen Description URINE, CATHETERIZED  Final   Special Requests NONE  Final   Culture   Final    NO GROWTH Performed at [facility], [address] [place], [place] [address]    Report Status 03/08/2021 FINAL  Final     Labs: BNP (last 3 results) Recent Labs    08/21/20 1801 09/18/20 1018 10/24/20 1159  BNP 226.2* 122.1* 36.3  Basic Metabolic Panel: Recent Labs  Lab 03/06/21 0353 03/07/21 0809 03/08/21 0115 03/09/21 0433  NA 134* 130* 136 140  K 3.0* 3.1* 3.9 4.5  CL 98 91* 98 104  CO2 22 28 27 28   GLUCOSE 142* 154* 158* 128*  BUN 82* 74* 65* 53*  CREATININE 1.93* 1.69* 1.46* 1.21  CALCIUM 7.7* 7.4* 7.5* 7.8*  MG 1.9 1.8 1.7 1.9  PHOS 4.6  --   --   --    Liver Function Tests: Recent Labs  Lab 03/06/21 0353  ALBUMIN 1.9*   No results  for input(s): LIPASE, AMYLASE in the last 168 hours. No results for input(s): AMMONIA in the last 168 hours. CBC: Recent Labs  Lab 03/06/21 0353 03/07/21 0809 03/08/21 0115 03/09/21 0433  WBC  --  8.0 9.6 10.5  NEUTROABS  --  5.0 6.8 7.5  HGB 8.8* 8.9* 8.6* 8.4*  HCT 27.6* 26.9* 26.9* 27.5*  MCV  --  73.9* 75.4* 77.5*  PLT  --  314 322 336   Cardiac Enzymes: No results for input(s): CKTOTAL, CKMB, CKMBINDEX, TROPONINI in the last 168 hours. BNP: Invalid input(s): POCBNP CBG: Recent Labs  Lab 03/11/21 0631 03/11/21 1048 03/11/21 1716 03/11/21 2029 03/12/21 0622  GLUCAP 102* 142* 164* 141* 129*   D-Dimer No results for input(s): DDIMER in the last 72 hours. Hgb A1c No results for input(s): HGBA1C in the last 72 hours. Lipid Profile No results for input(s): CHOL, HDL, LDLCALC, TRIG, CHOLHDL, LDLDIRECT in the last 72 hours. Thyroid function studies No results for input(s): TSH, T4TOTAL, T3FREE, THYROIDAB in the last 72 hours.  Invalid input(s): FREET3 Anemia work up No results for input(s): VITAMINB12, FOLATE, FERRITIN, TIBC, IRON, RETICCTPCT in the last 72 hours. Urinalysis    Component Value Date/Time   COLORURINE YELLOW 03/06/2021 0702   APPEARANCEUR CLEAR 03/06/2021 0702   APPEARANCEUR Clear 12/31/2013 2116   LABSPEC 1.025 03/06/2021 0702   LABSPEC 1.032 12/31/2013 2116   PHURINE 5.5 03/06/2021 0702   GLUCOSEU 100 (A) 03/06/2021 0702   GLUCOSEU >=500 12/31/2013 2116   HGBUR TRACE (A) 03/06/2021 0702   BILIRUBINUR NEGATIVE 03/06/2021 0702   BILIRUBINUR Negative 12/31/2013 2116   KETONESUR NEGATIVE 03/06/2021 0702   PROTEINUR 30 (A) 03/06/2021 0702   UROBILINOGEN 1.0 08/28/2013 1709   NITRITE NEGATIVE 03/06/2021 0702   LEUKOCYTESUR MODERATE (A) 03/06/2021 0702   LEUKOCYTESUR Negative 12/31/2013 2116   Sepsis Labs Invalid input(s): PROCALCITONIN,  WBC,  LACTICIDVEN Microbiology Recent Results (from the past 240 hour(s))  Urine Culture     Status: None    Collection Time: 03/06/21  7:02 AM   Specimen: Urine, Catheterized  Result Value Ref Range Status   Specimen Description URINE, CATHETERIZED  Final   Special Requests NONE  Final   Culture   Final    NO GROWTH Performed at [facility], [address], [place], [place] [address]    Report Status 03/08/2021 FINAL  Final     Time coordinating discharge: 35 minutes  SIGNED:   Aline August, MD  [facility] 03/12/2021, 11:55 AM

## 2021-03-14 DIAGNOSIS — E11622 Type 2 diabetes mellitus with other skin ulcer: Secondary | ICD-10-CM | POA: Diagnosis not present

## 2021-03-14 DIAGNOSIS — E119 Type 2 diabetes mellitus without complications: Secondary | ICD-10-CM | POA: Diagnosis not present

## 2021-03-14 DIAGNOSIS — M869 Osteomyelitis, unspecified: Secondary | ICD-10-CM | POA: Diagnosis not present

## 2021-03-14 DIAGNOSIS — R338 Other retention of urine: Secondary | ICD-10-CM | POA: Diagnosis not present

## 2021-03-14 DIAGNOSIS — R269 Unspecified abnormalities of gait and mobility: Secondary | ICD-10-CM | POA: Diagnosis not present

## 2021-03-14 DIAGNOSIS — I739 Peripheral vascular disease, unspecified: Secondary | ICD-10-CM | POA: Diagnosis not present

## 2021-03-14 DIAGNOSIS — M6281 Muscle weakness (generalized): Secondary | ICD-10-CM | POA: Diagnosis not present

## 2021-03-21 DIAGNOSIS — L89313 Pressure ulcer of right buttock, stage 3: Secondary | ICD-10-CM | POA: Diagnosis not present

## 2021-03-21 DIAGNOSIS — L89153 Pressure ulcer of sacral region, stage 3: Secondary | ICD-10-CM | POA: Diagnosis not present

## 2021-03-21 DIAGNOSIS — T8789 Other complications of amputation stump: Secondary | ICD-10-CM | POA: Diagnosis not present

## 2021-03-21 DIAGNOSIS — E11622 Type 2 diabetes mellitus with other skin ulcer: Secondary | ICD-10-CM | POA: Diagnosis not present

## 2021-03-28 DIAGNOSIS — L89313 Pressure ulcer of right buttock, stage 3: Secondary | ICD-10-CM | POA: Diagnosis not present

## 2021-03-28 DIAGNOSIS — R279 Unspecified lack of coordination: Secondary | ICD-10-CM | POA: Diagnosis not present

## 2021-03-28 DIAGNOSIS — L89153 Pressure ulcer of sacral region, stage 3: Secondary | ICD-10-CM | POA: Diagnosis not present

## 2021-03-28 DIAGNOSIS — M6281 Muscle weakness (generalized): Secondary | ICD-10-CM | POA: Diagnosis not present

## 2021-03-28 DIAGNOSIS — K5981 Ogilvie syndrome: Secondary | ICD-10-CM | POA: Diagnosis not present

## 2021-03-28 DIAGNOSIS — R278 Other lack of coordination: Secondary | ICD-10-CM | POA: Diagnosis not present

## 2021-03-28 DIAGNOSIS — T8789 Other complications of amputation stump: Secondary | ICD-10-CM | POA: Diagnosis not present

## 2021-03-28 DIAGNOSIS — I5030 Unspecified diastolic (congestive) heart failure: Secondary | ICD-10-CM | POA: Diagnosis not present

## 2021-03-28 DIAGNOSIS — Z89512 Acquired absence of left leg below knee: Secondary | ICD-10-CM | POA: Diagnosis not present

## 2021-03-28 DIAGNOSIS — E118 Type 2 diabetes mellitus with unspecified complications: Secondary | ICD-10-CM | POA: Diagnosis not present

## 2021-03-28 DIAGNOSIS — E11622 Type 2 diabetes mellitus with other skin ulcer: Secondary | ICD-10-CM | POA: Diagnosis not present

## 2021-03-29 DIAGNOSIS — R279 Unspecified lack of coordination: Secondary | ICD-10-CM | POA: Diagnosis not present

## 2021-03-29 DIAGNOSIS — R278 Other lack of coordination: Secondary | ICD-10-CM | POA: Diagnosis not present

## 2021-03-29 DIAGNOSIS — I739 Peripheral vascular disease, unspecified: Secondary | ICD-10-CM | POA: Diagnosis not present

## 2021-03-29 DIAGNOSIS — K5981 Ogilvie syndrome: Secondary | ICD-10-CM | POA: Diagnosis not present

## 2021-03-29 DIAGNOSIS — M6281 Muscle weakness (generalized): Secondary | ICD-10-CM | POA: Diagnosis not present

## 2021-03-29 DIAGNOSIS — E119 Type 2 diabetes mellitus without complications: Secondary | ICD-10-CM | POA: Diagnosis not present

## 2021-03-29 DIAGNOSIS — R338 Other retention of urine: Secondary | ICD-10-CM | POA: Diagnosis not present

## 2021-03-29 DIAGNOSIS — I5030 Unspecified diastolic (congestive) heart failure: Secondary | ICD-10-CM | POA: Diagnosis not present

## 2021-03-29 DIAGNOSIS — E118 Type 2 diabetes mellitus with unspecified complications: Secondary | ICD-10-CM | POA: Diagnosis not present

## 2021-03-29 DIAGNOSIS — Z89512 Acquired absence of left leg below knee: Secondary | ICD-10-CM | POA: Diagnosis not present

## 2021-03-30 DIAGNOSIS — E118 Type 2 diabetes mellitus with unspecified complications: Secondary | ICD-10-CM | POA: Diagnosis not present

## 2021-03-30 DIAGNOSIS — R279 Unspecified lack of coordination: Secondary | ICD-10-CM | POA: Diagnosis not present

## 2021-03-30 DIAGNOSIS — Z89512 Acquired absence of left leg below knee: Secondary | ICD-10-CM | POA: Diagnosis not present

## 2021-03-30 DIAGNOSIS — I5030 Unspecified diastolic (congestive) heart failure: Secondary | ICD-10-CM | POA: Diagnosis not present

## 2021-03-30 DIAGNOSIS — R278 Other lack of coordination: Secondary | ICD-10-CM | POA: Diagnosis not present

## 2021-03-30 DIAGNOSIS — M6281 Muscle weakness (generalized): Secondary | ICD-10-CM | POA: Diagnosis not present

## 2021-03-30 DIAGNOSIS — K5981 Ogilvie syndrome: Secondary | ICD-10-CM | POA: Diagnosis not present

## 2021-04-02 DIAGNOSIS — Z89512 Acquired absence of left leg below knee: Secondary | ICD-10-CM | POA: Diagnosis not present

## 2021-04-02 DIAGNOSIS — K5981 Ogilvie syndrome: Secondary | ICD-10-CM | POA: Diagnosis not present

## 2021-04-02 DIAGNOSIS — E118 Type 2 diabetes mellitus with unspecified complications: Secondary | ICD-10-CM | POA: Diagnosis not present

## 2021-04-02 DIAGNOSIS — R279 Unspecified lack of coordination: Secondary | ICD-10-CM | POA: Diagnosis not present

## 2021-04-02 DIAGNOSIS — M6281 Muscle weakness (generalized): Secondary | ICD-10-CM | POA: Diagnosis not present

## 2021-04-02 DIAGNOSIS — I5030 Unspecified diastolic (congestive) heart failure: Secondary | ICD-10-CM | POA: Diagnosis not present

## 2021-04-02 DIAGNOSIS — R278 Other lack of coordination: Secondary | ICD-10-CM | POA: Diagnosis not present

## 2021-04-03 DIAGNOSIS — Z89512 Acquired absence of left leg below knee: Secondary | ICD-10-CM | POA: Diagnosis not present

## 2021-04-03 DIAGNOSIS — L89313 Pressure ulcer of right buttock, stage 3: Secondary | ICD-10-CM | POA: Diagnosis not present

## 2021-04-03 DIAGNOSIS — L89153 Pressure ulcer of sacral region, stage 3: Secondary | ICD-10-CM | POA: Diagnosis not present

## 2021-04-03 DIAGNOSIS — I5030 Unspecified diastolic (congestive) heart failure: Secondary | ICD-10-CM | POA: Diagnosis not present

## 2021-04-03 DIAGNOSIS — E118 Type 2 diabetes mellitus with unspecified complications: Secondary | ICD-10-CM | POA: Diagnosis not present

## 2021-04-03 DIAGNOSIS — K5981 Ogilvie syndrome: Secondary | ICD-10-CM | POA: Diagnosis not present

## 2021-04-03 DIAGNOSIS — M6281 Muscle weakness (generalized): Secondary | ICD-10-CM | POA: Diagnosis not present

## 2021-04-03 DIAGNOSIS — R279 Unspecified lack of coordination: Secondary | ICD-10-CM | POA: Diagnosis not present

## 2021-04-03 DIAGNOSIS — L97821 Non-pressure chronic ulcer of other part of left lower leg limited to breakdown of skin: Secondary | ICD-10-CM | POA: Diagnosis not present

## 2021-04-03 DIAGNOSIS — R278 Other lack of coordination: Secondary | ICD-10-CM | POA: Diagnosis not present

## 2021-04-04 DIAGNOSIS — E118 Type 2 diabetes mellitus with unspecified complications: Secondary | ICD-10-CM | POA: Diagnosis not present

## 2021-04-04 DIAGNOSIS — R278 Other lack of coordination: Secondary | ICD-10-CM | POA: Diagnosis not present

## 2021-04-04 DIAGNOSIS — M6281 Muscle weakness (generalized): Secondary | ICD-10-CM | POA: Diagnosis not present

## 2021-04-04 DIAGNOSIS — Z89512 Acquired absence of left leg below knee: Secondary | ICD-10-CM | POA: Diagnosis not present

## 2021-04-04 DIAGNOSIS — I5030 Unspecified diastolic (congestive) heart failure: Secondary | ICD-10-CM | POA: Diagnosis not present

## 2021-04-04 DIAGNOSIS — R279 Unspecified lack of coordination: Secondary | ICD-10-CM | POA: Diagnosis not present

## 2021-04-04 DIAGNOSIS — K5981 Ogilvie syndrome: Secondary | ICD-10-CM | POA: Diagnosis not present

## 2021-04-05 DIAGNOSIS — Z89512 Acquired absence of left leg below knee: Secondary | ICD-10-CM | POA: Diagnosis not present

## 2021-04-05 DIAGNOSIS — E118 Type 2 diabetes mellitus with unspecified complications: Secondary | ICD-10-CM | POA: Diagnosis not present

## 2021-04-05 DIAGNOSIS — K5981 Ogilvie syndrome: Secondary | ICD-10-CM | POA: Diagnosis not present

## 2021-04-05 DIAGNOSIS — I5030 Unspecified diastolic (congestive) heart failure: Secondary | ICD-10-CM | POA: Diagnosis not present

## 2021-04-05 DIAGNOSIS — M6281 Muscle weakness (generalized): Secondary | ICD-10-CM | POA: Diagnosis not present

## 2021-04-05 DIAGNOSIS — R279 Unspecified lack of coordination: Secondary | ICD-10-CM | POA: Diagnosis not present

## 2021-04-05 DIAGNOSIS — R278 Other lack of coordination: Secondary | ICD-10-CM | POA: Diagnosis not present

## 2021-04-06 DIAGNOSIS — M6281 Muscle weakness (generalized): Secondary | ICD-10-CM | POA: Diagnosis not present

## 2021-04-06 DIAGNOSIS — K5981 Ogilvie syndrome: Secondary | ICD-10-CM | POA: Diagnosis not present

## 2021-04-06 DIAGNOSIS — I5030 Unspecified diastolic (congestive) heart failure: Secondary | ICD-10-CM | POA: Diagnosis not present

## 2021-04-06 DIAGNOSIS — Z89512 Acquired absence of left leg below knee: Secondary | ICD-10-CM | POA: Diagnosis not present

## 2021-04-06 DIAGNOSIS — R278 Other lack of coordination: Secondary | ICD-10-CM | POA: Diagnosis not present

## 2021-04-06 DIAGNOSIS — E118 Type 2 diabetes mellitus with unspecified complications: Secondary | ICD-10-CM | POA: Diagnosis not present

## 2021-04-06 DIAGNOSIS — R279 Unspecified lack of coordination: Secondary | ICD-10-CM | POA: Diagnosis not present

## 2021-04-09 DIAGNOSIS — I5030 Unspecified diastolic (congestive) heart failure: Secondary | ICD-10-CM | POA: Diagnosis not present

## 2021-04-09 DIAGNOSIS — M6281 Muscle weakness (generalized): Secondary | ICD-10-CM | POA: Diagnosis not present

## 2021-04-09 DIAGNOSIS — R278 Other lack of coordination: Secondary | ICD-10-CM | POA: Diagnosis not present

## 2021-04-09 DIAGNOSIS — E118 Type 2 diabetes mellitus with unspecified complications: Secondary | ICD-10-CM | POA: Diagnosis not present

## 2021-04-09 DIAGNOSIS — K5981 Ogilvie syndrome: Secondary | ICD-10-CM | POA: Diagnosis not present

## 2021-04-09 DIAGNOSIS — R279 Unspecified lack of coordination: Secondary | ICD-10-CM | POA: Diagnosis not present

## 2021-04-09 DIAGNOSIS — Z89512 Acquired absence of left leg below knee: Secondary | ICD-10-CM | POA: Diagnosis not present

## 2021-04-10 DIAGNOSIS — M6281 Muscle weakness (generalized): Secondary | ICD-10-CM | POA: Diagnosis not present

## 2021-04-10 DIAGNOSIS — Z89512 Acquired absence of left leg below knee: Secondary | ICD-10-CM | POA: Diagnosis not present

## 2021-04-10 DIAGNOSIS — I5030 Unspecified diastolic (congestive) heart failure: Secondary | ICD-10-CM | POA: Diagnosis not present

## 2021-04-10 DIAGNOSIS — E118 Type 2 diabetes mellitus with unspecified complications: Secondary | ICD-10-CM | POA: Diagnosis not present

## 2021-04-10 DIAGNOSIS — R278 Other lack of coordination: Secondary | ICD-10-CM | POA: Diagnosis not present

## 2021-04-10 DIAGNOSIS — K5981 Ogilvie syndrome: Secondary | ICD-10-CM | POA: Diagnosis not present

## 2021-04-10 DIAGNOSIS — R279 Unspecified lack of coordination: Secondary | ICD-10-CM | POA: Diagnosis not present

## 2021-04-11 DIAGNOSIS — T8789 Other complications of amputation stump: Secondary | ICD-10-CM | POA: Diagnosis not present

## 2021-04-11 DIAGNOSIS — E118 Type 2 diabetes mellitus with unspecified complications: Secondary | ICD-10-CM | POA: Diagnosis not present

## 2021-04-11 DIAGNOSIS — M6281 Muscle weakness (generalized): Secondary | ICD-10-CM | POA: Diagnosis not present

## 2021-04-11 DIAGNOSIS — H547 Unspecified visual loss: Secondary | ICD-10-CM | POA: Diagnosis not present

## 2021-04-11 DIAGNOSIS — K5981 Ogilvie syndrome: Secondary | ICD-10-CM | POA: Diagnosis not present

## 2021-04-11 DIAGNOSIS — L89313 Pressure ulcer of right buttock, stage 3: Secondary | ICD-10-CM | POA: Diagnosis not present

## 2021-04-11 DIAGNOSIS — I5030 Unspecified diastolic (congestive) heart failure: Secondary | ICD-10-CM | POA: Diagnosis not present

## 2021-04-11 DIAGNOSIS — I739 Peripheral vascular disease, unspecified: Secondary | ICD-10-CM | POA: Diagnosis not present

## 2021-04-11 DIAGNOSIS — L8990 Pressure ulcer of unspecified site, unspecified stage: Secondary | ICD-10-CM | POA: Diagnosis not present

## 2021-04-11 DIAGNOSIS — Z89512 Acquired absence of left leg below knee: Secondary | ICD-10-CM | POA: Diagnosis not present

## 2021-04-11 DIAGNOSIS — L89153 Pressure ulcer of sacral region, stage 3: Secondary | ICD-10-CM | POA: Diagnosis not present

## 2021-04-11 DIAGNOSIS — R279 Unspecified lack of coordination: Secondary | ICD-10-CM | POA: Diagnosis not present

## 2021-04-11 DIAGNOSIS — R278 Other lack of coordination: Secondary | ICD-10-CM | POA: Diagnosis not present

## 2021-04-11 DIAGNOSIS — M7989 Other specified soft tissue disorders: Secondary | ICD-10-CM | POA: Diagnosis not present

## 2021-04-11 DIAGNOSIS — R52 Pain, unspecified: Secondary | ICD-10-CM | POA: Diagnosis not present

## 2021-04-12 ENCOUNTER — Emergency Department (HOSPITAL_COMMUNITY): Payer: Medicare HMO

## 2021-04-12 ENCOUNTER — Emergency Department (HOSPITAL_COMMUNITY)
Admission: EM | Admit: 2021-04-12 | Discharge: 2021-04-12 | Disposition: A | Discharge status: Skilled Nursing Facility | Payer: Medicare HMO | Attending: Emergency Medicine | Admitting: Emergency Medicine

## 2021-04-12 ENCOUNTER — Other Ambulatory Visit: Payer: Self-pay

## 2021-04-12 DIAGNOSIS — I5033 Acute on chronic diastolic (congestive) heart failure: Secondary | ICD-10-CM | POA: Diagnosis not present

## 2021-04-12 DIAGNOSIS — E1122 Type 2 diabetes mellitus with diabetic chronic kidney disease: Secondary | ICD-10-CM | POA: Insufficient documentation

## 2021-04-12 DIAGNOSIS — R42 Dizziness and giddiness: Secondary | ICD-10-CM | POA: Insufficient documentation

## 2021-04-12 DIAGNOSIS — I10 Essential (primary) hypertension: Secondary | ICD-10-CM | POA: Diagnosis not present

## 2021-04-12 DIAGNOSIS — N390 Urinary tract infection, site not specified: Secondary | ICD-10-CM

## 2021-04-12 DIAGNOSIS — R55 Syncope and collapse: Secondary | ICD-10-CM | POA: Diagnosis not present

## 2021-04-12 DIAGNOSIS — I130 Hypertensive heart and chronic kidney disease with heart failure and stage 1 through stage 4 chronic kidney disease, or unspecified chronic kidney disease: Secondary | ICD-10-CM | POA: Diagnosis not present

## 2021-04-12 DIAGNOSIS — N1831 Chronic kidney disease, stage 3a: Secondary | ICD-10-CM | POA: Insufficient documentation

## 2021-04-12 DIAGNOSIS — Z743 Need for continuous supervision: Secondary | ICD-10-CM | POA: Diagnosis not present

## 2021-04-12 DIAGNOSIS — R404 Transient alteration of awareness: Secondary | ICD-10-CM | POA: Diagnosis not present

## 2021-04-12 DIAGNOSIS — W19XXXA Unspecified fall, initial encounter: Secondary | ICD-10-CM

## 2021-04-12 DIAGNOSIS — Z8673 Personal history of transient ischemic attack (TIA), and cerebral infarction without residual deficits: Secondary | ICD-10-CM

## 2021-04-12 LAB — CBC WITH DIFFERENTIAL/PLATELET
Abs Immature Granulocytes: 0.02 10*3/uL (ref 0.00–0.07)
Basophils Absolute: 0 10*3/uL (ref 0.0–0.1)
Basophils Relative: 1 %
Eosinophils Absolute: 0.4 10*3/uL (ref 0.0–0.5)
Eosinophils Relative: 8 %
HCT: 24 % — ABNORMAL LOW (ref 39.0–52.0)
Hemoglobin: 7.4 g/dL — ABNORMAL LOW (ref 13.0–17.0)
Immature Granulocytes: 1 %
Lymphocytes Relative: 43 %
Lymphs Abs: 1.9 10*3/uL (ref 0.7–4.0)
MCH: 24.6 pg — ABNORMAL LOW (ref 26.0–34.0)
MCHC: 30.8 g/dL (ref 30.0–36.0)
MCV: 79.7 fL — ABNORMAL LOW (ref 80.0–100.0)
Monocytes Absolute: 0.5 10*3/uL (ref 0.1–1.0)
Monocytes Relative: 10 %
Neutro Abs: 1.6 10*3/uL — ABNORMAL LOW (ref 1.7–7.7)
Neutrophils Relative %: 37 %
Platelets: 232 10*3/uL (ref 150–400)
RBC: 3.01 MIL/uL — ABNORMAL LOW (ref 4.22–5.81)
RDW: 20.3 % — ABNORMAL HIGH (ref 11.5–15.5)
WBC: 4.4 10*3/uL (ref 4.0–10.5)
nRBC: 0 % (ref 0.0–0.2)

## 2021-04-12 LAB — URINALYSIS, ROUTINE W REFLEX MICROSCOPIC
Bilirubin Urine: NEGATIVE
Glucose, UA: 50 mg/dL — AB
Hgb urine dipstick: NEGATIVE
Ketones, ur: NEGATIVE mg/dL
Leukocytes,Ua: NEGATIVE
Nitrite: NEGATIVE
Protein, ur: >=300 mg/dL — AB
Specific Gravity, Urine: 1.015 (ref 1.005–1.030)
pH: 5 (ref 5.0–8.0)

## 2021-04-12 LAB — COMPREHENSIVE METABOLIC PANEL
ALT: 12 U/L (ref 0–44)
AST: 11 U/L — ABNORMAL LOW (ref 15–41)
Albumin: 2.2 g/dL — ABNORMAL LOW (ref 3.5–5.0)
Alkaline Phosphatase: 90 U/L (ref 38–126)
Anion gap: 10 (ref 5–15)
BUN: 18 mg/dL (ref 6–20)
CO2: 19 mmol/L — ABNORMAL LOW (ref 22–32)
Calcium: 8.2 mg/dL — ABNORMAL LOW (ref 8.9–10.3)
Chloride: 111 mmol/L (ref 98–111)
Creatinine, Ser: 0.74 mg/dL (ref 0.61–1.24)
GFR, Estimated: >60 mL/min (ref >60)
Glucose, Bld: 121 mg/dL — ABNORMAL HIGH (ref 70–99)
Potassium: 3.1 mmol/L — ABNORMAL LOW (ref 3.5–5.1)
Sodium: 140 mmol/L (ref 135–145)
Total Bilirubin: 0.2 mg/dL — ABNORMAL LOW (ref 0.3–1.2)
Total Protein: 6.2 g/dL — ABNORMAL LOW (ref 6.5–8.1)

## 2021-04-12 LAB — CBG MONITORING, ED
Glucose-Capillary: 114 mg/dL — ABNORMAL HIGH (ref 70–99)
Glucose-Capillary: 117 mg/dL — ABNORMAL HIGH (ref 70–99)

## 2021-04-12 LAB — BRAIN NATRIURETIC PEPTIDE: B Natriuretic Peptide: 276.2 pg/mL — ABNORMAL HIGH (ref 0.0–100.0)

## 2021-04-12 MED ORDER — HYDROCODONE-ACETAMINOPHEN 5-325 MG PO TABS
1.0000 | ORAL_TABLET | Freq: Once | ORAL | Status: AC
  Administered 2021-04-12: 1 via ORAL
  Filled 2021-04-12: qty 1

## 2021-04-12 MED ORDER — POTASSIUM CHLORIDE CRYS ER 20 MEQ PO TBCR
40.0000 meq | EXTENDED_RELEASE_TABLET | Freq: Two times a day (BID) | ORAL | Status: DC
  Administered 2021-04-12 (×2): 40 meq via ORAL
  Filled 2021-04-12 (×2): qty 2

## 2021-04-12 MED ORDER — AMLODIPINE BESYLATE 5 MG PO TABS
5.0000 mg | ORAL_TABLET | Freq: Once | ORAL | Status: AC
  Administered 2021-04-12: 5 mg via ORAL
  Filled 2021-04-12: qty 1

## 2021-04-12 MED ORDER — CEPHALEXIN 500 MG PO CAPS
500.0000 mg | ORAL_CAPSULE | Freq: Three times a day (TID) | ORAL | 0 refills | Status: AC
Start: 2021-04-12 — End: 2021-04-22

## 2021-04-12 NOTE — ED Provider Notes (Signed)
[facility] EMERGENCY DEPARTMENT Provider Note  CSN: [id number] Arrival date & time: 04/12/21 9833  Chief Complaint(s) Delusional and Dizziness (Lightheadedness)  HPI [name] is a 61 y.o. 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  Patient reports that he got out of bed on his own this evening and as he stood up on his right leg he felt lightheaded and fell back onto the bed.  He was able to get himself situated on the bed again.  Reports that this felt like the prior strokes from the past.  Denied any fall onto the floor, head trauma, loss of consciousness.  Patient brought in by EMS.  EMS reported that staff was not aware that patient had fallen.  They report that the patient told him he had his prosthetics on his left leg but no prostatic was found in the room.    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   Depression 11/18/2020   Acute renal failure superimposed on stage 3a chronic kidney disease ([place]) 11/18/2020   BPH (benign prostatic hyperplasia) 11/18/2020   AKI (acute kidney injury) ([place]) 08/22/2020   CVA ([place]) with residual right-sided weakness  08/22/2020   Pericardial effusion 08/22/2020   Acute on chronic diastolic CHF (congestive heart failure) ([place]) 08/22/2020   TIA (transient ischemic attack) 07/03/2020   Acute on chronic heart failure with preserved ejection fraction ([place])    Pulmonary artery hypertension ([place]) 06/25/2020   History of cerebrovascular accident (CVA) with residual deficit 06/25/2020   CKD stage IIIa ([place]) 03/30/2020   Morbid (severe) obesity due to excess calories ([place]) 03/30/2020   Malnutrition of moderate degree 01/25/2020   Palliative care by specialist    Goals of care, counseling/discussion    Nephrotic syndrome 01/06/2020   Anasarca 01/06/2020   Hypokalemia due to loss of potassium 01/04/2020   Generalized body aches 01/01/2020   Non-traumatic rhabdomyolysis 01/01/2020   Chronic diastolic CHF (congestive heart failure) ([place]) 01/01/2020   Microcytic anemia 01/01/2020   Hypokalemia 12/31/2019   Elevated CK    Uncontrolled type 2 diabetes mellitus with hyperglycemia ([place])    Pure hypercholesterolemia    Hyperglycemia 09/28/2017   Nonobstructive atherosclerosis of coronary artery 09/28/2017   MDD (major depressive disorder), recurrent episode, severe ([place]) 07/30/2017   Osteoarthritis 04/12/2015   Severe recurrent major depression with psychotic features ([place]) 01/04/2015   Substance-related disorder ([place]) 10/30/2014   Patient's noncompliance with other medical treatment and regimen 06/07/2014   Cocaine dependence, uncomplicated ([place]) 82/50/5397   Chronic pain 05/31/2014   Cocaine abuse ([employer]) 05/31/2014   History of noncompliance with medical treatment 05/31/2014   Malingering 05/31/2014   Leukopenia 05/31/2014   Diabetic neuropathy  associated with diabetes mellitus due to underlying condition ([place]) 05/31/2014   Diabetic neuropathy ([place]) 05/31/2014   Depression, major, recurrent, severe with psychosis ([place]) 08/29/2013   Suicidal ideations 08/28/2013   Uncontrolled diabetes mellitus 06/30/2013    Atypical chest pain 12/09/2011   Essential hypertension 12/09/2011   Episodic mood disorder ([place]) 05/13/2011   Home Medication(s) Prior to Admission medications   Medication Sig Start Date End Date Taking? Authorizing Provider  acetaminophen (TYLENOL) 650 MG CR tablet Take 650 mg by mouth every 4 (four) hours as needed (for general discomfort).    [provider]  amLODipine (NORVASC) 10 MG tablet Take 0.5 tablets (5 mg total) by mouth daily. 03/12/21   Aline August, MD  aspirin 81 MG chewable tablet Chew 81 mg by mouth daily.    [provider]  atorvastatin (LIPITOR) 80 MG tablet Take 80 mg by mouth at bedtime.    [provider]  brimonidine (ALPHAGAN) 0.2 % ophthalmic solution Place 1 drop into both eyes in the morning and at bedtime. 12/20/20   Blanchie Dessert, MD  dorzolamide (TRUSOPT) 2 % ophthalmic solution Place 1 drop into both eyes 2 (two) times daily. 12/20/20   Blanchie Dessert, MD  ferrous sulfate 325 (65 FE) MG tablet Take 1 tablet (325 mg total) by mouth daily. 09/23/20   [name], [name], DO  insulin glargine-yfgn (SEMGLEE) 100 UNIT/ML Pen Inject 3 Units into the skin daily. 03/12/21   Aline August, MD  Multiple Vitamin (MULTIVITAMIN) tablet Take 1 tablet by mouth daily.    [provider]  ondansetron (ZOFRAN) 4 MG tablet Take 4 mg by mouth every 4 (four) hours as needed for nausea or vomiting.    [provider]  pantoprazole (PROTONIX) 40 MG tablet Take 40 mg by mouth daily.    [provider]  pregabalin (LYRICA) 75 MG capsule Take 75 mg by mouth 2 (two) times daily.    [provider]  saccharomyces boulardii (FLORASTOR) 250 MG capsule Take 1 capsule (250 mg total) by mouth 2 (two) times daily. 09/07/20   Antonieta Pert, MD  senna-docusate (SENOKOT-S) 8.6-50 MG tablet Take 1 tablet by mouth daily.    [provider]  sertraline (ZOLOFT) 50 MG tablet Take 50 mg by mouth at bedtime.    [provider]  Skin Protectants, Misc. ([place] MOIST BARRIER/ZINC) CREA Apply 1 application topically See admin instructions. Apply to bilateral shin, heels,left lower buttock skin tear. Place foam dressing over bilateral shins every day and night shift for wound care    [provider]  tamsulosin (FLOMAX) 0.4 MG CAPS capsule Take 0.8 mg by mouth daily.    [provider]  Vitamin D, Ergocalciferol, (DRISDOL) 1.25 MG (50000 UNIT) CAPS capsule Take 50,000 Units by mouth every 30 (thirty) days. 17th of each month    [provider]  Allergies Other  Review of Systems Review of Systems  Neurological:  Positive for dizziness.  As noted in HPI  Physical Exam Vital Signs  I have reviewed the triage vital signs BP (!) 170/94    Pulse 78    Temp 97.6 F (36.4 C) (Oral)    Resp 15    SpO2 97%   Physical Exam Vitals reviewed.  Constitutional:      General: He is not in acute distress.    Appearance: He is well-developed. He is not diaphoretic.  HENT:     Head: Normocephalic and atraumatic.     Nose: Nose normal.  Eyes:     General: No scleral icterus.       Right eye: No discharge.        Left eye: No discharge.     Conjunctiva/sclera: Conjunctivae normal.     Pupils: Pupils are equal, round, and reactive to light.  Cardiovascular:     Rate and Rhythm: Normal rate and regular rhythm.     Heart sounds: No murmur heard.   No friction rub. No gallop.     Comments: anasarca Pulmonary:     Effort: Pulmonary effort is normal. No respiratory distress.     Breath sounds: Normal breath sounds. No stridor. No rales.  Abdominal:     General: There is no distension.     Palpations: Abdomen is soft.     Tenderness: There is no abdominal tenderness.  Musculoskeletal:        General: No tenderness.     Cervical back: Normal range of  motion and neck supple.       Legs:  Skin:    General: Skin is warm and dry.     Findings: No erythema or rash.  Neurological:     Mental Status: He is alert and oriented to person, place, and time.     Comments: Mental Status:  Alert and oriented to person, place, and time.  Attention and concentration normal.  Speech clear.   Cranial Nerves:  II Visual Fields: diminished (reports this is chronic from prior stroke) III, IV, VI: Pupils equal and reactive to light and near.  V Facial Sensation: Normal. No weakness of masticatory muscles  VII: No facial weakness or asymmetry  VIII Auditory Acuity: Grossly normal  IX/X: The uvula is midline; the palate elevates symmetrically  XI: Normal sternocleidomastoid and trapezius strength  XII: The tongue is midline. No atrophy or fasciculations.   Motor System: Muscle Strength: 3/5 to RUE. 4/5 to LUE. 1/5to RLE. 4/5 to LLE.  Muscle Tone: Tone and muscle bulk are normal in the upper and lower extremities.  Reflexes: DTRs: No Clonus Coordination: No tremor.  Sensation: Intact to light touch Gait: deferred     ED Results and Treatments Labs (all labs ordered are listed, but only abnormal results are displayed) Labs Reviewed  CBC WITH DIFFERENTIAL/PLATELET - Abnormal; Notable for the following components:      Result Value   RBC 3.01 (*)    Hemoglobin 7.4 (*)    HCT 24.0 (*)    MCV 79.7 (*)    MCH 24.6 (*)    RDW 20.3 (*)    Neutro Abs 1.6 (*)    All other components within normal limits  COMPREHENSIVE METABOLIC PANEL - Abnormal; Notable for the following components:   Potassium 3.1 (*)    CO2 19 (*)    Glucose, Bld 121 (*)    Calcium 8.2 (*)    Total  Protein 6.2 (*)    Albumin 2.2 (*)    AST 11 (*)    Total Bilirubin 0.2 (*)    All other components within normal limits  BRAIN NATRIURETIC PEPTIDE - Abnormal; Notable for the following components:   B Natriuretic Peptide 276.2 (*)    All other components within normal limits   CBG MONITORING, ED - Abnormal; Notable for the following components:   Glucose-Capillary 114 (*)    All other components within normal limits  URINALYSIS, ROUTINE W REFLEX MICROSCOPIC                                                                                                                         EKG  EKG Interpretation  Date/Time:  Thursday April 12 2021 05:42:47 EST Ventricular Rate:  76 PR Interval:  175 QRS Duration: 89 QT Interval:  431 QTC Calculation: 485 R Axis:   15 Text Interpretation: Sinus rhythm Nonspecific T abnormalities, lateral leads Borderline prolonged QT interval Confirmed by Addison Lank [phone]) on 04/12/2021 5:53:28 AM       Radiology CT Head Wo Contrast  Result Date: 04/12/2021 CLINICAL DATA:  Dizziness.  Delusional. EXAM: CT HEAD WITHOUT CONTRAST TECHNIQUE: Contiguous axial images were obtained from the base of the skull through the vertex without intravenous contrast. RADIATION DOSE REDUCTION: This exam was performed according to the departmental dose-optimization program which includes automated exposure control, adjustment of the mA and/or kV according to patient size and/or use of iterative reconstruction technique. COMPARISON:  MRI brain 12/19/2020 FINDINGS: Brain: No evidence of acute infarction, hemorrhage, hydrocephalus, extra-axial collection or mass lesion/mass effect. There is mild diffuse low-attenuation within the subcortical and periventricular white matter compatible with chronic microvascular disease. Prominence of the sulci and ventricles compatible with brain atrophy. Vascular: No hyperdense vessel or unexpected calcification. Skull: Normal. Negative for fracture or focal lesion. Sinuses/Orbits: No acute abnormality Other: None IMPRESSION: 1. No acute intracranial abnormalities. 2. Chronic small vessel ischemic disease and brain atrophy. Electronically Signed   By: Kerby Moors M.D.   On: 04/12/2021 07:06    Pertinent labs & imaging  results that were available during my care of the patient were reviewed by me and considered in my medical decision making (see MDM for details).  Medications Ordered in ED Medications  HYDROcodone-acetaminophen (NORCO/VICODIN) 5-325 MG per tablet 1 tablet (has no administration in time range)  Procedures Procedures  (including critical care time)  Medical Decision Making / ED Course        lightheadedness Patient is concerned for stroke. History details seem to have fluctuated. Given the patient's inability to move or shift in bed, I question his ability to stand.  On review of record from recent admission, PT noted: Clinical Impression  Pt in bed upon arrival of PT, agreeable to evaluation at this time. Prior to admission the pt was completely dependent on staff to complete all rolling and repositioning in the bed, and staff used a hoyer lift to move the pt OOB for any transfers. The pt reports that he is not able to complete any mobility or self-care for himself other than feeding, and therefore is likely at his baseline mobility at this time. He tolerated more PROM to L UE and LLE, but reported pain with passive movement of R-sided extremities at this time. Required totalA to attempt to reposition or pull to sitting in bed. No further acute PT needs, will be safe to return to facility with no follow up therapies when medically cleared.      Clinic evaluation is complicated due to patient's PMH. Will get labs and CT head. Will rule out UTI. Low suspicion for CVA.  Work-up ordered to assess concerns above. Labs and imaging Independently interpreted by me and noted below: CBC without leukocytosis.  Anemia of chronic disease down from last month postoperatively. Metabolic panel with mild hypokalemia.  No renal insufficiency.  Evidence of malnutrition with  decreased protein and albumin. CT head negative for ICH. UA pending  Management: monitor   Patient care turned over to oncoming provider. Patient case and results discussed in detail; please see their note for further ED managment.     Final Clinical Impression(s) / ED Diagnoses Final diagnoses:  None           This chart was dictated using voice recognition software.  Despite best efforts to proofread,  errors can occur which can change the documentation meaning.    Fatima Blank, MD 04/12/21 [phone]

## 2021-04-12 NOTE — ED Notes (Signed)
RN gave pt shasta and water.

## 2021-04-12 NOTE — ED Notes (Signed)
Pt is 9th on PTAR list

## 2021-04-12 NOTE — ED Notes (Signed)
Pt is 5th on PTAR list

## 2021-04-12 NOTE — ED Provider Notes (Signed)
°  Physical Exam  BP (!) 170/94    Pulse 78    Temp 97.6 F (36.4 C) (Oral)    Resp 15    SpO2 97%   Physical Exam  Procedures  Procedures  ED Course / MDM    Medical Decision Making Amount and/or Complexity of Data Reviewed Labs: ordered. Radiology: ordered.  Risk Prescription drug management.   Received care of patient from Dr. Leonette Monarch.  Please see his note for prior history, physical and care.  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  He reports that he has not been able to stand up or been mobile at baseline, is not supposed to get up on his own, however had called out several times for someone to come help him to go to the bathroom and then attempted to get up on his own.  He reports he stood up on his right leg, which is chronically weak since his stroke and then fell back onto the bed.  Reports that this reminded him of when he had his stroke, and he developed weakness that made him fall to the floor, but notes that he has had chronic weakness in this right lower extremity that is not any worse today.  The event, however reminded him of the memory of when he first had the stroke.  Reports that this scared him.  He denies any other new numbness, weakness, difficulty walking, visual changes or known facial droop.   Labs completed show mild hypokalemia, anemia slightly worse than prior.  Denies facility telling him he had black or bloody stools, and he had a bowel movement in the emergency department that was also not noted to be black or bloody by staff.  Urinalysis pending at this time.  Feel he is stable for continued outpatient care at his skilled nursing facility.  UA shows concern for UTI. Will give rx for  keflex, may be discharged without outpatient follow up.           Gareth Morgan, MD 04/12/21 1228

## 2021-04-12 NOTE — ED Notes (Signed)
Pt cleaned up. Fresh linens placed and new brief. Urinal at bedside with call light within reach.

## 2021-04-12 NOTE — ED Notes (Signed)
Informed pt of needed UA sample. Pt verbalized understanding and stated he has a brief on and can not always tell when he has to urinate.

## 2021-04-12 NOTE — ED Notes (Signed)
Attempted to call [facility] x2

## 2021-04-12 NOTE — ED Triage Notes (Signed)
Pt arrives to ED BIB [facility] from [place] due to "Stroke Symptoms" Per EMS pt states " I had a stroke because I was standing on the side of the bed and I fell" Per EMS the staff at [facility] state that the pt has not fallen and that the pt has not been able to get out of bed. Hx of Stoke in 2021 with Rt sided deficit and recent Lf BKA with staples still present, appears to be healing well and no signs of infection. Per EMS pt was asked how he was able to stand and he stated he "With my prosthetic leg" Per the facility pt is completely bed bound, does not have prosthetic leg and that the pt never got out of bed nor did he stand up. Per the pt he believes that he is still able to use his RT leg, He has a prosthetic leg and that he is able to stand. Hx HTN, CHF, Diabetes, Blind RT eye.

## 2021-04-12 NOTE — ED Notes (Signed)
Attempted to call [facility] x3

## 2021-04-12 NOTE — ED Notes (Signed)
Attempted to call [facility]

## 2021-04-13 DIAGNOSIS — M6281 Muscle weakness (generalized): Secondary | ICD-10-CM | POA: Diagnosis not present

## 2021-04-13 DIAGNOSIS — I5030 Unspecified diastolic (congestive) heart failure: Secondary | ICD-10-CM | POA: Diagnosis not present

## 2021-04-13 DIAGNOSIS — Z89512 Acquired absence of left leg below knee: Secondary | ICD-10-CM | POA: Diagnosis not present

## 2021-04-13 DIAGNOSIS — K5981 Ogilvie syndrome: Secondary | ICD-10-CM | POA: Diagnosis not present

## 2021-04-13 DIAGNOSIS — E118 Type 2 diabetes mellitus with unspecified complications: Secondary | ICD-10-CM | POA: Diagnosis not present

## 2021-04-13 DIAGNOSIS — R279 Unspecified lack of coordination: Secondary | ICD-10-CM | POA: Diagnosis not present

## 2021-04-13 DIAGNOSIS — R278 Other lack of coordination: Secondary | ICD-10-CM | POA: Diagnosis not present

## 2021-04-14 DIAGNOSIS — Z89512 Acquired absence of left leg below knee: Secondary | ICD-10-CM | POA: Diagnosis not present

## 2021-04-14 DIAGNOSIS — R278 Other lack of coordination: Secondary | ICD-10-CM | POA: Diagnosis not present

## 2021-04-14 DIAGNOSIS — R279 Unspecified lack of coordination: Secondary | ICD-10-CM | POA: Diagnosis not present

## 2021-04-14 DIAGNOSIS — I5030 Unspecified diastolic (congestive) heart failure: Secondary | ICD-10-CM | POA: Diagnosis not present

## 2021-04-14 DIAGNOSIS — E118 Type 2 diabetes mellitus with unspecified complications: Secondary | ICD-10-CM | POA: Diagnosis not present

## 2021-04-14 DIAGNOSIS — M6281 Muscle weakness (generalized): Secondary | ICD-10-CM | POA: Diagnosis not present

## 2021-04-14 DIAGNOSIS — K5981 Ogilvie syndrome: Secondary | ICD-10-CM | POA: Diagnosis not present

## 2021-04-16 DIAGNOSIS — Z89512 Acquired absence of left leg below knee: Secondary | ICD-10-CM | POA: Diagnosis not present

## 2021-04-16 DIAGNOSIS — M6281 Muscle weakness (generalized): Secondary | ICD-10-CM | POA: Diagnosis not present

## 2021-04-16 DIAGNOSIS — R278 Other lack of coordination: Secondary | ICD-10-CM | POA: Diagnosis not present

## 2021-04-16 DIAGNOSIS — R279 Unspecified lack of coordination: Secondary | ICD-10-CM | POA: Diagnosis not present

## 2021-04-16 DIAGNOSIS — K5981 Ogilvie syndrome: Secondary | ICD-10-CM | POA: Diagnosis not present

## 2021-04-16 DIAGNOSIS — E118 Type 2 diabetes mellitus with unspecified complications: Secondary | ICD-10-CM | POA: Diagnosis not present

## 2021-04-16 DIAGNOSIS — I5030 Unspecified diastolic (congestive) heart failure: Secondary | ICD-10-CM | POA: Diagnosis not present

## 2021-04-17 DIAGNOSIS — M6281 Muscle weakness (generalized): Secondary | ICD-10-CM | POA: Diagnosis not present

## 2021-04-17 DIAGNOSIS — K5981 Ogilvie syndrome: Secondary | ICD-10-CM | POA: Diagnosis not present

## 2021-04-17 DIAGNOSIS — I5030 Unspecified diastolic (congestive) heart failure: Secondary | ICD-10-CM | POA: Diagnosis not present

## 2021-04-17 DIAGNOSIS — R278 Other lack of coordination: Secondary | ICD-10-CM | POA: Diagnosis not present

## 2021-04-17 DIAGNOSIS — E118 Type 2 diabetes mellitus with unspecified complications: Secondary | ICD-10-CM | POA: Diagnosis not present

## 2021-04-17 DIAGNOSIS — Z89512 Acquired absence of left leg below knee: Secondary | ICD-10-CM | POA: Diagnosis not present

## 2021-04-17 DIAGNOSIS — R279 Unspecified lack of coordination: Secondary | ICD-10-CM | POA: Diagnosis not present

## 2021-04-18 DIAGNOSIS — Z89512 Acquired absence of left leg below knee: Secondary | ICD-10-CM | POA: Diagnosis not present

## 2021-04-18 DIAGNOSIS — M6281 Muscle weakness (generalized): Secondary | ICD-10-CM | POA: Diagnosis not present

## 2021-04-18 DIAGNOSIS — L89153 Pressure ulcer of sacral region, stage 3: Secondary | ICD-10-CM | POA: Diagnosis not present

## 2021-04-18 DIAGNOSIS — R278 Other lack of coordination: Secondary | ICD-10-CM | POA: Diagnosis not present

## 2021-04-18 DIAGNOSIS — I5030 Unspecified diastolic (congestive) heart failure: Secondary | ICD-10-CM | POA: Diagnosis not present

## 2021-04-18 DIAGNOSIS — E118 Type 2 diabetes mellitus with unspecified complications: Secondary | ICD-10-CM | POA: Diagnosis not present

## 2021-04-18 DIAGNOSIS — R279 Unspecified lack of coordination: Secondary | ICD-10-CM | POA: Diagnosis not present

## 2021-04-18 DIAGNOSIS — L89313 Pressure ulcer of right buttock, stage 3: Secondary | ICD-10-CM | POA: Diagnosis not present

## 2021-04-18 DIAGNOSIS — L8990 Pressure ulcer of unspecified site, unspecified stage: Secondary | ICD-10-CM | POA: Diagnosis not present

## 2021-04-18 DIAGNOSIS — K5981 Ogilvie syndrome: Secondary | ICD-10-CM | POA: Diagnosis not present

## 2021-04-18 DIAGNOSIS — T8789 Other complications of amputation stump: Secondary | ICD-10-CM | POA: Diagnosis not present

## 2021-04-19 DIAGNOSIS — M6281 Muscle weakness (generalized): Secondary | ICD-10-CM | POA: Diagnosis not present

## 2021-04-19 DIAGNOSIS — Z89512 Acquired absence of left leg below knee: Secondary | ICD-10-CM | POA: Diagnosis not present

## 2021-04-19 DIAGNOSIS — R279 Unspecified lack of coordination: Secondary | ICD-10-CM | POA: Diagnosis not present

## 2021-04-19 DIAGNOSIS — I5030 Unspecified diastolic (congestive) heart failure: Secondary | ICD-10-CM | POA: Diagnosis not present

## 2021-04-19 DIAGNOSIS — E118 Type 2 diabetes mellitus with unspecified complications: Secondary | ICD-10-CM | POA: Diagnosis not present

## 2021-04-19 DIAGNOSIS — K5981 Ogilvie syndrome: Secondary | ICD-10-CM | POA: Diagnosis not present

## 2021-04-19 DIAGNOSIS — R278 Other lack of coordination: Secondary | ICD-10-CM | POA: Diagnosis not present

## 2021-04-20 DIAGNOSIS — R278 Other lack of coordination: Secondary | ICD-10-CM | POA: Diagnosis not present

## 2021-04-20 DIAGNOSIS — E118 Type 2 diabetes mellitus with unspecified complications: Secondary | ICD-10-CM | POA: Diagnosis not present

## 2021-04-20 DIAGNOSIS — R279 Unspecified lack of coordination: Secondary | ICD-10-CM | POA: Diagnosis not present

## 2021-04-20 DIAGNOSIS — K5981 Ogilvie syndrome: Secondary | ICD-10-CM | POA: Diagnosis not present

## 2021-04-20 DIAGNOSIS — I5030 Unspecified diastolic (congestive) heart failure: Secondary | ICD-10-CM | POA: Diagnosis not present

## 2021-04-20 DIAGNOSIS — Z89512 Acquired absence of left leg below knee: Secondary | ICD-10-CM | POA: Diagnosis not present

## 2021-04-20 DIAGNOSIS — M6281 Muscle weakness (generalized): Secondary | ICD-10-CM | POA: Diagnosis not present

## 2021-04-22 DIAGNOSIS — Z89512 Acquired absence of left leg below knee: Secondary | ICD-10-CM | POA: Diagnosis not present

## 2021-04-22 DIAGNOSIS — K5981 Ogilvie syndrome: Secondary | ICD-10-CM | POA: Diagnosis not present

## 2021-04-22 DIAGNOSIS — E118 Type 2 diabetes mellitus with unspecified complications: Secondary | ICD-10-CM | POA: Diagnosis not present

## 2021-04-22 DIAGNOSIS — I5030 Unspecified diastolic (congestive) heart failure: Secondary | ICD-10-CM | POA: Diagnosis not present

## 2021-04-22 DIAGNOSIS — M6281 Muscle weakness (generalized): Secondary | ICD-10-CM | POA: Diagnosis not present

## 2021-04-22 DIAGNOSIS — R279 Unspecified lack of coordination: Secondary | ICD-10-CM | POA: Diagnosis not present

## 2021-04-22 DIAGNOSIS — R278 Other lack of coordination: Secondary | ICD-10-CM | POA: Diagnosis not present

## 2021-04-23 DIAGNOSIS — M6281 Muscle weakness (generalized): Secondary | ICD-10-CM | POA: Diagnosis not present

## 2021-04-23 DIAGNOSIS — I5030 Unspecified diastolic (congestive) heart failure: Secondary | ICD-10-CM | POA: Diagnosis not present

## 2021-04-23 DIAGNOSIS — R278 Other lack of coordination: Secondary | ICD-10-CM | POA: Diagnosis not present

## 2021-04-23 DIAGNOSIS — Z89512 Acquired absence of left leg below knee: Secondary | ICD-10-CM | POA: Diagnosis not present

## 2021-04-23 DIAGNOSIS — K5981 Ogilvie syndrome: Secondary | ICD-10-CM | POA: Diagnosis not present

## 2021-04-23 DIAGNOSIS — R279 Unspecified lack of coordination: Secondary | ICD-10-CM | POA: Diagnosis not present

## 2021-04-23 DIAGNOSIS — E118 Type 2 diabetes mellitus with unspecified complications: Secondary | ICD-10-CM | POA: Diagnosis not present

## 2021-04-24 DIAGNOSIS — M6281 Muscle weakness (generalized): Secondary | ICD-10-CM | POA: Diagnosis not present

## 2021-04-24 DIAGNOSIS — K5981 Ogilvie syndrome: Secondary | ICD-10-CM | POA: Diagnosis not present

## 2021-04-24 DIAGNOSIS — L89153 Pressure ulcer of sacral region, stage 3: Secondary | ICD-10-CM | POA: Diagnosis not present

## 2021-04-24 DIAGNOSIS — R279 Unspecified lack of coordination: Secondary | ICD-10-CM | POA: Diagnosis not present

## 2021-04-24 DIAGNOSIS — R278 Other lack of coordination: Secondary | ICD-10-CM | POA: Diagnosis not present

## 2021-04-24 DIAGNOSIS — I5030 Unspecified diastolic (congestive) heart failure: Secondary | ICD-10-CM | POA: Diagnosis not present

## 2021-04-24 DIAGNOSIS — E118 Type 2 diabetes mellitus with unspecified complications: Secondary | ICD-10-CM | POA: Diagnosis not present

## 2021-04-24 DIAGNOSIS — L89313 Pressure ulcer of right buttock, stage 3: Secondary | ICD-10-CM | POA: Diagnosis not present

## 2021-04-24 DIAGNOSIS — Z89512 Acquired absence of left leg below knee: Secondary | ICD-10-CM | POA: Diagnosis not present

## 2021-04-24 DIAGNOSIS — R2231 Localized swelling, mass and lump, right upper limb: Secondary | ICD-10-CM | POA: Diagnosis not present

## 2021-04-24 DIAGNOSIS — T8789 Other complications of amputation stump: Secondary | ICD-10-CM | POA: Diagnosis not present

## 2021-04-24 DIAGNOSIS — L8990 Pressure ulcer of unspecified site, unspecified stage: Secondary | ICD-10-CM | POA: Diagnosis not present

## 2021-04-25 DIAGNOSIS — R69 Illness, unspecified: Secondary | ICD-10-CM | POA: Diagnosis not present

## 2021-04-25 DIAGNOSIS — F321 Major depressive disorder, single episode, moderate: Secondary | ICD-10-CM | POA: Diagnosis not present

## 2021-04-25 DIAGNOSIS — M2550 Pain in unspecified joint: Secondary | ICD-10-CM | POA: Diagnosis not present

## 2021-04-25 DIAGNOSIS — H4051X3 Glaucoma secondary to other eye disorders, right eye, severe stage: Secondary | ICD-10-CM | POA: Diagnosis not present

## 2021-04-25 DIAGNOSIS — R279 Unspecified lack of coordination: Secondary | ICD-10-CM | POA: Diagnosis not present

## 2021-04-25 DIAGNOSIS — R2689 Other abnormalities of gait and mobility: Secondary | ICD-10-CM | POA: Diagnosis not present

## 2021-04-25 DIAGNOSIS — E113591 Type 2 diabetes mellitus with proliferative diabetic retinopathy without macular edema, right eye: Secondary | ICD-10-CM | POA: Diagnosis not present

## 2021-04-25 DIAGNOSIS — R1312 Dysphagia, oropharyngeal phase: Secondary | ICD-10-CM | POA: Diagnosis not present

## 2021-04-25 DIAGNOSIS — H547 Unspecified visual loss: Secondary | ICD-10-CM | POA: Diagnosis not present

## 2021-04-25 DIAGNOSIS — M6281 Muscle weakness (generalized): Secondary | ICD-10-CM | POA: Diagnosis not present

## 2021-04-25 DIAGNOSIS — H2512 Age-related nuclear cataract, left eye: Secondary | ICD-10-CM | POA: Diagnosis not present

## 2021-04-25 DIAGNOSIS — E11319 Type 2 diabetes mellitus with unspecified diabetic retinopathy without macular edema: Secondary | ICD-10-CM | POA: Diagnosis not present

## 2021-04-25 DIAGNOSIS — E113512 Type 2 diabetes mellitus with proliferative diabetic retinopathy with macular edema, left eye: Secondary | ICD-10-CM | POA: Diagnosis not present

## 2021-04-25 DIAGNOSIS — Z89512 Acquired absence of left leg below knee: Secondary | ICD-10-CM | POA: Diagnosis not present

## 2021-04-25 DIAGNOSIS — M7989 Other specified soft tissue disorders: Secondary | ICD-10-CM | POA: Diagnosis not present

## 2021-04-25 DIAGNOSIS — H43812 Vitreous degeneration, left eye: Secondary | ICD-10-CM | POA: Diagnosis not present

## 2021-04-25 DIAGNOSIS — E876 Hypokalemia: Secondary | ICD-10-CM | POA: Diagnosis not present

## 2021-04-25 DIAGNOSIS — R52 Pain, unspecified: Secondary | ICD-10-CM | POA: Diagnosis not present

## 2021-04-25 DIAGNOSIS — I739 Peripheral vascular disease, unspecified: Secondary | ICD-10-CM | POA: Diagnosis not present

## 2021-04-25 DIAGNOSIS — E118 Type 2 diabetes mellitus with unspecified complications: Secondary | ICD-10-CM | POA: Diagnosis not present

## 2021-04-25 DIAGNOSIS — K5981 Ogilvie syndrome: Secondary | ICD-10-CM | POA: Diagnosis not present

## 2021-04-25 DIAGNOSIS — T8789 Other complications of amputation stump: Secondary | ICD-10-CM | POA: Diagnosis not present

## 2021-04-25 DIAGNOSIS — H35372 Puckering of macula, left eye: Secondary | ICD-10-CM | POA: Diagnosis not present

## 2021-04-25 DIAGNOSIS — L89153 Pressure ulcer of sacral region, stage 3: Secondary | ICD-10-CM | POA: Diagnosis not present

## 2021-04-25 DIAGNOSIS — H401124 Primary open-angle glaucoma, left eye, indeterminate stage: Secondary | ICD-10-CM | POA: Diagnosis not present

## 2021-04-25 DIAGNOSIS — H401113 Primary open-angle glaucoma, right eye, severe stage: Secondary | ICD-10-CM | POA: Diagnosis not present

## 2021-04-25 DIAGNOSIS — H47092 Other disorders of optic nerve, not elsewhere classified, left eye: Secondary | ICD-10-CM | POA: Diagnosis not present

## 2021-04-25 DIAGNOSIS — R41841 Cognitive communication deficit: Secondary | ICD-10-CM | POA: Diagnosis not present

## 2021-04-25 DIAGNOSIS — L89313 Pressure ulcer of right buttock, stage 3: Secondary | ICD-10-CM | POA: Diagnosis not present

## 2021-04-25 DIAGNOSIS — L8990 Pressure ulcer of unspecified site, unspecified stage: Secondary | ICD-10-CM | POA: Diagnosis not present

## 2021-04-25 DIAGNOSIS — I5030 Unspecified diastolic (congestive) heart failure: Secondary | ICD-10-CM | POA: Diagnosis not present

## 2021-04-25 DIAGNOSIS — R278 Other lack of coordination: Secondary | ICD-10-CM | POA: Diagnosis not present

## 2021-04-25 DIAGNOSIS — H4311 Vitreous hemorrhage, right eye: Secondary | ICD-10-CM | POA: Diagnosis not present

## 2021-04-26 DIAGNOSIS — R279 Unspecified lack of coordination: Secondary | ICD-10-CM | POA: Diagnosis not present

## 2021-04-26 DIAGNOSIS — M6281 Muscle weakness (generalized): Secondary | ICD-10-CM | POA: Diagnosis not present

## 2021-04-26 DIAGNOSIS — I5030 Unspecified diastolic (congestive) heart failure: Secondary | ICD-10-CM | POA: Diagnosis not present

## 2021-04-26 DIAGNOSIS — E118 Type 2 diabetes mellitus with unspecified complications: Secondary | ICD-10-CM | POA: Diagnosis not present

## 2021-04-26 DIAGNOSIS — R278 Other lack of coordination: Secondary | ICD-10-CM | POA: Diagnosis not present

## 2021-04-26 DIAGNOSIS — Z89512 Acquired absence of left leg below knee: Secondary | ICD-10-CM | POA: Diagnosis not present

## 2021-04-26 DIAGNOSIS — K5981 Ogilvie syndrome: Secondary | ICD-10-CM | POA: Diagnosis not present

## 2021-04-27 DIAGNOSIS — R279 Unspecified lack of coordination: Secondary | ICD-10-CM | POA: Diagnosis not present

## 2021-04-27 DIAGNOSIS — Z89512 Acquired absence of left leg below knee: Secondary | ICD-10-CM | POA: Diagnosis not present

## 2021-04-27 DIAGNOSIS — K5981 Ogilvie syndrome: Secondary | ICD-10-CM | POA: Diagnosis not present

## 2021-04-27 DIAGNOSIS — E118 Type 2 diabetes mellitus with unspecified complications: Secondary | ICD-10-CM | POA: Diagnosis not present

## 2021-04-27 DIAGNOSIS — R278 Other lack of coordination: Secondary | ICD-10-CM | POA: Diagnosis not present

## 2021-04-27 DIAGNOSIS — I5030 Unspecified diastolic (congestive) heart failure: Secondary | ICD-10-CM | POA: Diagnosis not present

## 2021-04-27 DIAGNOSIS — M6281 Muscle weakness (generalized): Secondary | ICD-10-CM | POA: Diagnosis not present

## 2021-04-30 DIAGNOSIS — M6281 Muscle weakness (generalized): Secondary | ICD-10-CM | POA: Diagnosis not present

## 2021-04-30 DIAGNOSIS — I5030 Unspecified diastolic (congestive) heart failure: Secondary | ICD-10-CM | POA: Diagnosis not present

## 2021-04-30 DIAGNOSIS — R278 Other lack of coordination: Secondary | ICD-10-CM | POA: Diagnosis not present

## 2021-04-30 DIAGNOSIS — K5981 Ogilvie syndrome: Secondary | ICD-10-CM | POA: Diagnosis not present

## 2021-04-30 DIAGNOSIS — Z89512 Acquired absence of left leg below knee: Secondary | ICD-10-CM | POA: Diagnosis not present

## 2021-04-30 DIAGNOSIS — E118 Type 2 diabetes mellitus with unspecified complications: Secondary | ICD-10-CM | POA: Diagnosis not present

## 2021-04-30 DIAGNOSIS — R279 Unspecified lack of coordination: Secondary | ICD-10-CM | POA: Diagnosis not present

## 2021-04-30 NOTE — Progress Notes (Signed)
[facility] ?PROGRESS NOTE ? ?ASSESSMENT / PLAN: ?[name] is a 61 y.o. male status post left below knee amputation 03/07/21. Staples out today. Will refer to [facility].  Follow up PRN. ? ?SUBJECTIVE: ?In nursing home. Doing OK. Mobilizing with PT. Staples not yet removed. ? ?OBJECTIVE: ?There were no vitals taken for this visit. ? ?Constitutional: chronically ill appearing. no acute distress. ?Vascular: L BKA healing appropriately. Staples and sutures removed. ? ?CBC Latest Ref Rng & Units 04/12/2021 03/09/2021 03/08/2021  ?WBC 4.0 - 10.5 K/uL 4.4 10.5 9.6  ?Hemoglobin 13.0 - 17.0 g/dL 7.4(L) 8.4(L) 8.6(L)  ?Hematocrit 39.0 - 52.0 % 24.0(L) 27.5(L) 26.9(L)  ?Platelets 150 - 400 K/uL 232 336 322  ?  ? ?CMP Latest Ref Rng & Units 04/12/2021 03/09/2021 03/08/2021  ?Glucose 70 - 99 mg/dL 121(H) 128(H) 158(H)  ?BUN 6 - 20 mg/dL 18 53(H) 65(H)  ?Creatinine 0.61 - 1.24 mg/dL 0.74 1.21 1.46(H)  ?Sodium 135 - 145 mmol/L 140 140 136  ?Potassium 3.5 - 5.1 mmol/L 3.1(L) 4.5 3.9  ?Chloride 98 - 111 mmol/L 111 104 98  ?CO2 22 - 32 mmol/L 19(L) 28 27  ?Calcium 8.9 - 10.3 mg/dL 8.2(L) 7.8(L) 7.5(L)  ?Total Protein 6.5 - 8.1 g/dL 6.2(L) - -  ?Total Bilirubin 0.3 - 1.2 mg/dL 0.2(L) - -  ?Alkaline Phos 38 - 126 U/L 90 - -  ?AST 15 - 41 U/L 11(L) - -  ?ALT 0 - 44 U/L 12 - -  ? ? ?CrCl cannot be calculated (Unknown ideal weight.). ? ?The patient has a left Below Knee Amputation. The patient is well motivated to return to their prior functional status by utilizing a prosthesis to perform ADL's and maintain a healthy lifestyle. The patient has the physical and cognitive capacity to function with a prosthesis.  ? ?Functional Level: ?K1 Household Ambulator: Has the ability or potential to use prosthesis for transfers/ambulation on level surfaces at a fixed cadence ? ?Residual Limb History: ?The skin condition of the residual limb is good. The patient will continue to monitor the skin of the residual limb and  follow hygiene instructions.  ?The patient is experiencing no pain related to amputation ? ?Prosthetic Prescription Plan: ?Counseling and education regarding prosthetic management will be provided to the patient via a certified prosthetist. ?A multi-discipline team, including physical therapy, will manage the prosthetic fabrication, fitting and prosthetic gait training.  ? ? ?[name]. Stanford Breed, MD ?[facility] ?Office Phone Number: [phone] ?04/30/2021 4:47 PM ? ? ? ?

## 2021-05-01 ENCOUNTER — Other Ambulatory Visit: Payer: Self-pay

## 2021-05-01 ENCOUNTER — Encounter: Payer: Self-pay | Admitting: Vascular Surgery

## 2021-05-01 ENCOUNTER — Ambulatory Visit (INDEPENDENT_AMBULATORY_CARE_PROVIDER_SITE_OTHER): Payer: Medicare HMO | Admitting: Vascular Surgery

## 2021-05-01 VITALS — BP 134/72 | HR 67 | Temp 98.1°F | Resp 18 | Ht 68.0 in | Wt 222.0 lb

## 2021-05-01 DIAGNOSIS — Z89512 Acquired absence of left leg below knee: Secondary | ICD-10-CM

## 2021-05-01 DIAGNOSIS — L89153 Pressure ulcer of sacral region, stage 3: Secondary | ICD-10-CM | POA: Diagnosis not present

## 2021-05-01 DIAGNOSIS — R279 Unspecified lack of coordination: Secondary | ICD-10-CM | POA: Diagnosis not present

## 2021-05-01 DIAGNOSIS — E118 Type 2 diabetes mellitus with unspecified complications: Secondary | ICD-10-CM | POA: Diagnosis not present

## 2021-05-01 DIAGNOSIS — K5981 Ogilvie syndrome: Secondary | ICD-10-CM | POA: Diagnosis not present

## 2021-05-01 DIAGNOSIS — I5030 Unspecified diastolic (congestive) heart failure: Secondary | ICD-10-CM | POA: Diagnosis not present

## 2021-05-01 DIAGNOSIS — M6281 Muscle weakness (generalized): Secondary | ICD-10-CM | POA: Diagnosis not present

## 2021-05-01 DIAGNOSIS — L89893 Pressure ulcer of other site, stage 3: Secondary | ICD-10-CM | POA: Diagnosis not present

## 2021-05-01 DIAGNOSIS — R278 Other lack of coordination: Secondary | ICD-10-CM | POA: Diagnosis not present

## 2021-05-01 DIAGNOSIS — L89313 Pressure ulcer of right buttock, stage 3: Secondary | ICD-10-CM | POA: Diagnosis not present

## 2021-05-01 DIAGNOSIS — T8789 Other complications of amputation stump: Secondary | ICD-10-CM | POA: Diagnosis not present

## 2021-05-01 DIAGNOSIS — L97821 Non-pressure chronic ulcer of other part of left lower leg limited to breakdown of skin: Secondary | ICD-10-CM | POA: Diagnosis not present

## 2021-05-01 DIAGNOSIS — L8990 Pressure ulcer of unspecified site, unspecified stage: Secondary | ICD-10-CM | POA: Diagnosis not present

## 2021-05-02 DIAGNOSIS — R279 Unspecified lack of coordination: Secondary | ICD-10-CM | POA: Diagnosis not present

## 2021-05-02 DIAGNOSIS — I5030 Unspecified diastolic (congestive) heart failure: Secondary | ICD-10-CM | POA: Diagnosis not present

## 2021-05-02 DIAGNOSIS — R278 Other lack of coordination: Secondary | ICD-10-CM | POA: Diagnosis not present

## 2021-05-02 DIAGNOSIS — M6281 Muscle weakness (generalized): Secondary | ICD-10-CM | POA: Diagnosis not present

## 2021-05-02 DIAGNOSIS — Z89512 Acquired absence of left leg below knee: Secondary | ICD-10-CM | POA: Diagnosis not present

## 2021-05-02 DIAGNOSIS — K5981 Ogilvie syndrome: Secondary | ICD-10-CM | POA: Diagnosis not present

## 2021-05-02 DIAGNOSIS — E118 Type 2 diabetes mellitus with unspecified complications: Secondary | ICD-10-CM | POA: Diagnosis not present

## 2021-05-03 DIAGNOSIS — H547 Unspecified visual loss: Secondary | ICD-10-CM | POA: Diagnosis not present

## 2021-05-03 DIAGNOSIS — M7989 Other specified soft tissue disorders: Secondary | ICD-10-CM | POA: Diagnosis not present

## 2021-05-03 DIAGNOSIS — I5030 Unspecified diastolic (congestive) heart failure: Secondary | ICD-10-CM | POA: Diagnosis not present

## 2021-05-03 DIAGNOSIS — R279 Unspecified lack of coordination: Secondary | ICD-10-CM | POA: Diagnosis not present

## 2021-05-03 DIAGNOSIS — R52 Pain, unspecified: Secondary | ICD-10-CM | POA: Diagnosis not present

## 2021-05-03 DIAGNOSIS — R278 Other lack of coordination: Secondary | ICD-10-CM | POA: Diagnosis not present

## 2021-05-03 DIAGNOSIS — K5981 Ogilvie syndrome: Secondary | ICD-10-CM | POA: Diagnosis not present

## 2021-05-03 DIAGNOSIS — E118 Type 2 diabetes mellitus with unspecified complications: Secondary | ICD-10-CM | POA: Diagnosis not present

## 2021-05-03 DIAGNOSIS — I739 Peripheral vascular disease, unspecified: Secondary | ICD-10-CM | POA: Diagnosis not present

## 2021-05-03 DIAGNOSIS — Z89512 Acquired absence of left leg below knee: Secondary | ICD-10-CM | POA: Diagnosis not present

## 2021-05-03 DIAGNOSIS — M6281 Muscle weakness (generalized): Secondary | ICD-10-CM | POA: Diagnosis not present

## 2021-05-04 DIAGNOSIS — Z89512 Acquired absence of left leg below knee: Secondary | ICD-10-CM | POA: Diagnosis not present

## 2021-05-04 DIAGNOSIS — I5030 Unspecified diastolic (congestive) heart failure: Secondary | ICD-10-CM | POA: Diagnosis not present

## 2021-05-04 DIAGNOSIS — M6281 Muscle weakness (generalized): Secondary | ICD-10-CM | POA: Diagnosis not present

## 2021-05-04 DIAGNOSIS — R278 Other lack of coordination: Secondary | ICD-10-CM | POA: Diagnosis not present

## 2021-05-04 DIAGNOSIS — K5981 Ogilvie syndrome: Secondary | ICD-10-CM | POA: Diagnosis not present

## 2021-05-04 DIAGNOSIS — R279 Unspecified lack of coordination: Secondary | ICD-10-CM | POA: Diagnosis not present

## 2021-05-04 DIAGNOSIS — E118 Type 2 diabetes mellitus with unspecified complications: Secondary | ICD-10-CM | POA: Diagnosis not present

## 2021-05-07 DIAGNOSIS — E118 Type 2 diabetes mellitus with unspecified complications: Secondary | ICD-10-CM | POA: Diagnosis not present

## 2021-05-07 DIAGNOSIS — Z89512 Acquired absence of left leg below knee: Secondary | ICD-10-CM | POA: Diagnosis not present

## 2021-05-07 DIAGNOSIS — I5030 Unspecified diastolic (congestive) heart failure: Secondary | ICD-10-CM | POA: Diagnosis not present

## 2021-05-07 DIAGNOSIS — K5981 Ogilvie syndrome: Secondary | ICD-10-CM | POA: Diagnosis not present

## 2021-05-07 DIAGNOSIS — R278 Other lack of coordination: Secondary | ICD-10-CM | POA: Diagnosis not present

## 2021-05-07 DIAGNOSIS — M6281 Muscle weakness (generalized): Secondary | ICD-10-CM | POA: Diagnosis not present

## 2021-05-07 DIAGNOSIS — R279 Unspecified lack of coordination: Secondary | ICD-10-CM | POA: Diagnosis not present

## 2021-05-14 DIAGNOSIS — H43812 Vitreous degeneration, left eye: Secondary | ICD-10-CM | POA: Diagnosis not present

## 2021-05-14 DIAGNOSIS — E113512 Type 2 diabetes mellitus with proliferative diabetic retinopathy with macular edema, left eye: Secondary | ICD-10-CM | POA: Diagnosis not present

## 2021-05-14 DIAGNOSIS — E113591 Type 2 diabetes mellitus with proliferative diabetic retinopathy without macular edema, right eye: Secondary | ICD-10-CM | POA: Diagnosis not present

## 2021-05-14 DIAGNOSIS — H4311 Vitreous hemorrhage, right eye: Secondary | ICD-10-CM | POA: Diagnosis not present

## 2021-05-14 DIAGNOSIS — H4051X3 Glaucoma secondary to other eye disorders, right eye, severe stage: Secondary | ICD-10-CM | POA: Diagnosis not present

## 2021-05-14 DIAGNOSIS — H35372 Puckering of macula, left eye: Secondary | ICD-10-CM | POA: Diagnosis not present

## 2021-05-16 DIAGNOSIS — L8990 Pressure ulcer of unspecified site, unspecified stage: Secondary | ICD-10-CM | POA: Diagnosis not present

## 2021-05-16 DIAGNOSIS — T8789 Other complications of amputation stump: Secondary | ICD-10-CM | POA: Diagnosis not present

## 2021-05-16 DIAGNOSIS — L89313 Pressure ulcer of right buttock, stage 3: Secondary | ICD-10-CM | POA: Diagnosis not present

## 2021-05-16 DIAGNOSIS — L89153 Pressure ulcer of sacral region, stage 3: Secondary | ICD-10-CM | POA: Diagnosis not present

## 2021-05-23 DIAGNOSIS — L8990 Pressure ulcer of unspecified site, unspecified stage: Secondary | ICD-10-CM | POA: Diagnosis not present

## 2021-05-23 DIAGNOSIS — L89313 Pressure ulcer of right buttock, stage 3: Secondary | ICD-10-CM | POA: Diagnosis not present

## 2021-05-23 DIAGNOSIS — R69 Illness, unspecified: Secondary | ICD-10-CM | POA: Diagnosis not present

## 2021-05-23 DIAGNOSIS — T8789 Other complications of amputation stump: Secondary | ICD-10-CM | POA: Diagnosis not present

## 2021-05-23 DIAGNOSIS — L89153 Pressure ulcer of sacral region, stage 3: Secondary | ICD-10-CM | POA: Diagnosis not present

## 2021-05-28 DIAGNOSIS — E113512 Type 2 diabetes mellitus with proliferative diabetic retinopathy with macular edema, left eye: Secondary | ICD-10-CM | POA: Diagnosis not present

## 2021-05-30 DIAGNOSIS — L89313 Pressure ulcer of right buttock, stage 3: Secondary | ICD-10-CM | POA: Diagnosis not present

## 2021-05-30 DIAGNOSIS — L8990 Pressure ulcer of unspecified site, unspecified stage: Secondary | ICD-10-CM | POA: Diagnosis not present

## 2021-05-30 DIAGNOSIS — L89153 Pressure ulcer of sacral region, stage 3: Secondary | ICD-10-CM | POA: Diagnosis not present

## 2021-05-30 DIAGNOSIS — E11622 Type 2 diabetes mellitus with other skin ulcer: Secondary | ICD-10-CM | POA: Diagnosis not present

## 2021-05-31 DIAGNOSIS — I739 Peripheral vascular disease, unspecified: Secondary | ICD-10-CM | POA: Diagnosis not present

## 2021-05-31 DIAGNOSIS — R52 Pain, unspecified: Secondary | ICD-10-CM | POA: Diagnosis not present

## 2021-05-31 DIAGNOSIS — I5030 Unspecified diastolic (congestive) heart failure: Secondary | ICD-10-CM | POA: Diagnosis not present

## 2021-05-31 DIAGNOSIS — M7989 Other specified soft tissue disorders: Secondary | ICD-10-CM | POA: Diagnosis not present

## 2021-06-06 DIAGNOSIS — R69 Illness, unspecified: Secondary | ICD-10-CM | POA: Diagnosis not present

## 2021-06-06 DIAGNOSIS — L8990 Pressure ulcer of unspecified site, unspecified stage: Secondary | ICD-10-CM | POA: Diagnosis not present

## 2021-06-06 DIAGNOSIS — T8789 Other complications of amputation stump: Secondary | ICD-10-CM | POA: Diagnosis not present

## 2021-06-06 DIAGNOSIS — E11622 Type 2 diabetes mellitus with other skin ulcer: Secondary | ICD-10-CM | POA: Diagnosis not present

## 2021-06-06 DIAGNOSIS — F321 Major depressive disorder, single episode, moderate: Secondary | ICD-10-CM | POA: Diagnosis not present

## 2021-06-06 DIAGNOSIS — R269 Unspecified abnormalities of gait and mobility: Secondary | ICD-10-CM | POA: Diagnosis not present

## 2021-06-12 DIAGNOSIS — R269 Unspecified abnormalities of gait and mobility: Secondary | ICD-10-CM | POA: Diagnosis not present

## 2021-06-12 DIAGNOSIS — I130 Hypertensive heart and chronic kidney disease with heart failure and stage 1 through stage 4 chronic kidney disease, or unspecified chronic kidney disease: Secondary | ICD-10-CM | POA: Diagnosis not present

## 2021-06-12 DIAGNOSIS — E1122 Type 2 diabetes mellitus with diabetic chronic kidney disease: Secondary | ICD-10-CM | POA: Diagnosis not present

## 2021-06-12 DIAGNOSIS — E11622 Type 2 diabetes mellitus with other skin ulcer: Secondary | ICD-10-CM | POA: Diagnosis not present

## 2021-06-12 DIAGNOSIS — R69 Illness, unspecified: Secondary | ICD-10-CM | POA: Diagnosis not present

## 2021-06-12 DIAGNOSIS — T8789 Other complications of amputation stump: Secondary | ICD-10-CM | POA: Diagnosis not present

## 2021-06-12 DIAGNOSIS — E1140 Type 2 diabetes mellitus with diabetic neuropathy, unspecified: Secondary | ICD-10-CM | POA: Diagnosis not present

## 2021-06-12 DIAGNOSIS — L8990 Pressure ulcer of unspecified site, unspecified stage: Secondary | ICD-10-CM | POA: Diagnosis not present

## 2021-06-13 DIAGNOSIS — D508 Other iron deficiency anemias: Secondary | ICD-10-CM | POA: Diagnosis not present

## 2021-06-13 DIAGNOSIS — E119 Type 2 diabetes mellitus without complications: Secondary | ICD-10-CM | POA: Diagnosis not present

## 2021-06-13 DIAGNOSIS — I5030 Unspecified diastolic (congestive) heart failure: Secondary | ICD-10-CM | POA: Diagnosis not present

## 2021-06-13 DIAGNOSIS — N183 Chronic kidney disease, stage 3 unspecified: Secondary | ICD-10-CM | POA: Diagnosis not present

## 2021-06-15 ENCOUNTER — Encounter (INDEPENDENT_AMBULATORY_CARE_PROVIDER_SITE_OTHER): Payer: Medicare HMO | Admitting: Primary Care

## 2021-06-18 DIAGNOSIS — I10 Essential (primary) hypertension: Secondary | ICD-10-CM | POA: Diagnosis not present

## 2021-06-18 DIAGNOSIS — E118 Type 2 diabetes mellitus with unspecified complications: Secondary | ICD-10-CM | POA: Diagnosis not present

## 2021-06-19 DIAGNOSIS — R69 Illness, unspecified: Secondary | ICD-10-CM | POA: Diagnosis not present

## 2021-06-19 DIAGNOSIS — F321 Major depressive disorder, single episode, moderate: Secondary | ICD-10-CM | POA: Diagnosis not present

## 2021-06-20 DIAGNOSIS — L8990 Pressure ulcer of unspecified site, unspecified stage: Secondary | ICD-10-CM | POA: Diagnosis not present

## 2021-06-20 DIAGNOSIS — E11622 Type 2 diabetes mellitus with other skin ulcer: Secondary | ICD-10-CM | POA: Diagnosis not present

## 2021-06-20 DIAGNOSIS — R269 Unspecified abnormalities of gait and mobility: Secondary | ICD-10-CM | POA: Diagnosis not present

## 2021-06-20 DIAGNOSIS — T8789 Other complications of amputation stump: Secondary | ICD-10-CM | POA: Diagnosis not present

## 2021-06-27 DIAGNOSIS — L8990 Pressure ulcer of unspecified site, unspecified stage: Secondary | ICD-10-CM | POA: Diagnosis not present

## 2021-06-27 DIAGNOSIS — R269 Unspecified abnormalities of gait and mobility: Secondary | ICD-10-CM | POA: Diagnosis not present

## 2021-06-27 DIAGNOSIS — E11622 Type 2 diabetes mellitus with other skin ulcer: Secondary | ICD-10-CM | POA: Diagnosis not present

## 2021-06-27 DIAGNOSIS — T8789 Other complications of amputation stump: Secondary | ICD-10-CM | POA: Diagnosis not present

## 2021-07-02 DIAGNOSIS — R69 Illness, unspecified: Secondary | ICD-10-CM | POA: Diagnosis not present

## 2021-07-02 DIAGNOSIS — F321 Major depressive disorder, single episode, moderate: Secondary | ICD-10-CM | POA: Diagnosis not present

## 2021-07-05 DIAGNOSIS — I5030 Unspecified diastolic (congestive) heart failure: Secondary | ICD-10-CM | POA: Diagnosis not present

## 2021-07-05 DIAGNOSIS — E119 Type 2 diabetes mellitus without complications: Secondary | ICD-10-CM | POA: Diagnosis not present

## 2021-07-05 DIAGNOSIS — E1140 Type 2 diabetes mellitus with diabetic neuropathy, unspecified: Secondary | ICD-10-CM | POA: Diagnosis not present

## 2021-07-05 DIAGNOSIS — D508 Other iron deficiency anemias: Secondary | ICD-10-CM | POA: Diagnosis not present

## 2021-07-07 DIAGNOSIS — I509 Heart failure, unspecified: Secondary | ICD-10-CM | POA: Diagnosis not present

## 2021-07-07 DIAGNOSIS — I10 Essential (primary) hypertension: Secondary | ICD-10-CM | POA: Diagnosis not present

## 2021-07-09 DIAGNOSIS — H4311 Vitreous hemorrhage, right eye: Secondary | ICD-10-CM | POA: Diagnosis not present

## 2021-07-09 DIAGNOSIS — E113512 Type 2 diabetes mellitus with proliferative diabetic retinopathy with macular edema, left eye: Secondary | ICD-10-CM | POA: Diagnosis not present

## 2021-07-09 DIAGNOSIS — E113591 Type 2 diabetes mellitus with proliferative diabetic retinopathy without macular edema, right eye: Secondary | ICD-10-CM | POA: Diagnosis not present

## 2021-07-09 DIAGNOSIS — R2231 Localized swelling, mass and lump, right upper limb: Secondary | ICD-10-CM | POA: Diagnosis not present

## 2021-07-09 DIAGNOSIS — H4051X3 Glaucoma secondary to other eye disorders, right eye, severe stage: Secondary | ICD-10-CM | POA: Diagnosis not present

## 2021-07-10 DIAGNOSIS — R609 Edema, unspecified: Secondary | ICD-10-CM | POA: Diagnosis not present

## 2021-07-10 DIAGNOSIS — R69 Illness, unspecified: Secondary | ICD-10-CM | POA: Diagnosis not present

## 2021-07-10 DIAGNOSIS — I509 Heart failure, unspecified: Secondary | ICD-10-CM | POA: Diagnosis not present

## 2021-07-10 DIAGNOSIS — I10 Essential (primary) hypertension: Secondary | ICD-10-CM | POA: Diagnosis not present

## 2021-07-12 DIAGNOSIS — E118 Type 2 diabetes mellitus with unspecified complications: Secondary | ICD-10-CM | POA: Diagnosis not present

## 2021-07-12 DIAGNOSIS — I5030 Unspecified diastolic (congestive) heart failure: Secondary | ICD-10-CM | POA: Diagnosis not present

## 2021-07-12 DIAGNOSIS — I10 Essential (primary) hypertension: Secondary | ICD-10-CM | POA: Diagnosis not present

## 2021-07-17 DIAGNOSIS — L603 Nail dystrophy: Secondary | ICD-10-CM | POA: Diagnosis not present

## 2021-07-17 DIAGNOSIS — I739 Peripheral vascular disease, unspecified: Secondary | ICD-10-CM | POA: Diagnosis not present

## 2021-07-17 DIAGNOSIS — E1140 Type 2 diabetes mellitus with diabetic neuropathy, unspecified: Secondary | ICD-10-CM | POA: Diagnosis not present

## 2021-07-24 DIAGNOSIS — E113591 Type 2 diabetes mellitus with proliferative diabetic retinopathy without macular edema, right eye: Secondary | ICD-10-CM | POA: Diagnosis not present

## 2021-07-24 DIAGNOSIS — H4311 Vitreous hemorrhage, right eye: Secondary | ICD-10-CM | POA: Diagnosis not present

## 2021-07-24 DIAGNOSIS — H4053X3 Glaucoma secondary to other eye disorders, bilateral, severe stage: Secondary | ICD-10-CM | POA: Diagnosis not present

## 2021-07-24 DIAGNOSIS — E113512 Type 2 diabetes mellitus with proliferative diabetic retinopathy with macular edema, left eye: Secondary | ICD-10-CM | POA: Diagnosis not present

## 2021-07-26 DIAGNOSIS — I5030 Unspecified diastolic (congestive) heart failure: Secondary | ICD-10-CM | POA: Diagnosis not present

## 2021-07-26 DIAGNOSIS — M6281 Muscle weakness (generalized): Secondary | ICD-10-CM | POA: Diagnosis not present

## 2021-07-26 DIAGNOSIS — R5381 Other malaise: Secondary | ICD-10-CM | POA: Diagnosis not present

## 2021-07-26 DIAGNOSIS — N1831 Chronic kidney disease, stage 3a: Secondary | ICD-10-CM | POA: Diagnosis not present

## 2021-07-26 DIAGNOSIS — K5981 Ogilvie syndrome: Secondary | ICD-10-CM | POA: Diagnosis not present

## 2021-07-26 DIAGNOSIS — E118 Type 2 diabetes mellitus with unspecified complications: Secondary | ICD-10-CM | POA: Diagnosis not present

## 2021-08-02 DIAGNOSIS — R252 Cramp and spasm: Secondary | ICD-10-CM | POA: Diagnosis not present

## 2021-08-10 DIAGNOSIS — E119 Type 2 diabetes mellitus without complications: Secondary | ICD-10-CM | POA: Diagnosis not present

## 2021-08-13 DIAGNOSIS — E11319 Type 2 diabetes mellitus with unspecified diabetic retinopathy without macular edema: Secondary | ICD-10-CM | POA: Diagnosis not present

## 2021-08-13 DIAGNOSIS — H2512 Age-related nuclear cataract, left eye: Secondary | ICD-10-CM | POA: Diagnosis not present

## 2021-08-13 DIAGNOSIS — H401113 Primary open-angle glaucoma, right eye, severe stage: Secondary | ICD-10-CM | POA: Diagnosis not present

## 2021-08-13 DIAGNOSIS — H47092 Other disorders of optic nerve, not elsewhere classified, left eye: Secondary | ICD-10-CM | POA: Diagnosis not present

## 2021-08-13 DIAGNOSIS — H401124 Primary open-angle glaucoma, left eye, indeterminate stage: Secondary | ICD-10-CM | POA: Diagnosis not present

## 2021-08-13 DIAGNOSIS — E118 Type 2 diabetes mellitus with unspecified complications: Secondary | ICD-10-CM | POA: Diagnosis not present

## 2021-08-13 DIAGNOSIS — I10 Essential (primary) hypertension: Secondary | ICD-10-CM | POA: Diagnosis not present

## 2021-08-14 DIAGNOSIS — N183 Chronic kidney disease, stage 3 unspecified: Secondary | ICD-10-CM | POA: Diagnosis not present

## 2021-08-14 DIAGNOSIS — E119 Type 2 diabetes mellitus without complications: Secondary | ICD-10-CM | POA: Diagnosis not present

## 2021-08-14 DIAGNOSIS — I10 Essential (primary) hypertension: Secondary | ICD-10-CM | POA: Diagnosis not present

## 2021-08-14 DIAGNOSIS — I5030 Unspecified diastolic (congestive) heart failure: Secondary | ICD-10-CM | POA: Diagnosis not present

## 2021-08-20 DIAGNOSIS — R69 Illness, unspecified: Secondary | ICD-10-CM | POA: Diagnosis not present

## 2021-08-20 DIAGNOSIS — F321 Major depressive disorder, single episode, moderate: Secondary | ICD-10-CM | POA: Diagnosis not present

## 2021-08-21 IMAGING — CT CT ANGIO CHEST
2 of 6 series · 18 of 36 positions shown · IV contrast (omnipaque)
Comparison: 09/28/2017

CLINICAL DATA: Left side chest pain

EXAM:
CT ANGIOGRAPHY CHEST WITH CONTRAST
TECHNIQUE: Multidetector CT imaging of the chest was performed using the
standard protocol during bolus administration of intravenous
contrast. Multiplanar CT image reconstructions and MIPs were
obtained to evaluate the vascular anatomy.
CONTRAST:  75mL OMNIPAQUE IOHEXOL 350 MG/ML SOLN

[Series 7: pe thins · axial · 0.98mm/px · z∈[-98,+114]mm · 17 of 337 slices shown]
[im 17/337  lung]
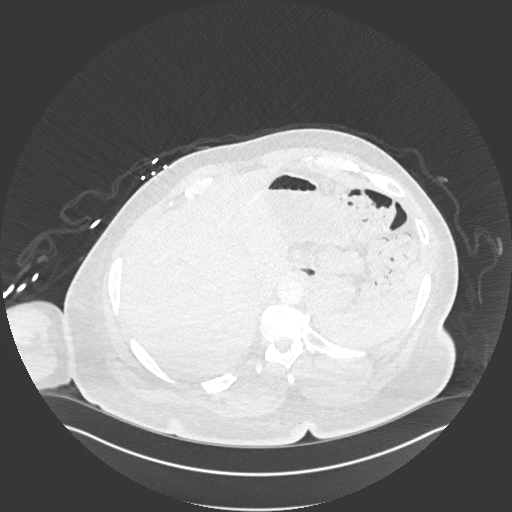
[im 34/337  mediastinal]
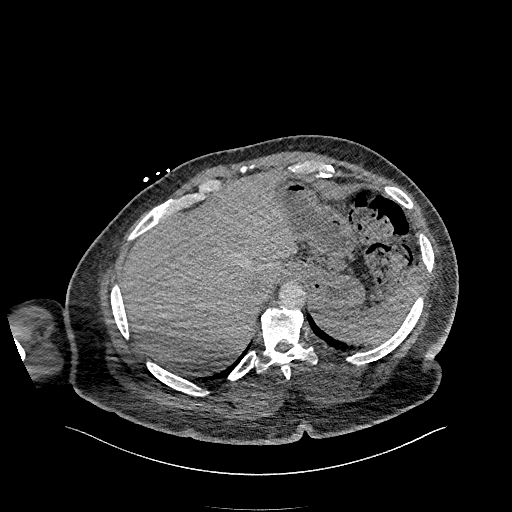
[im 51/337  lung]
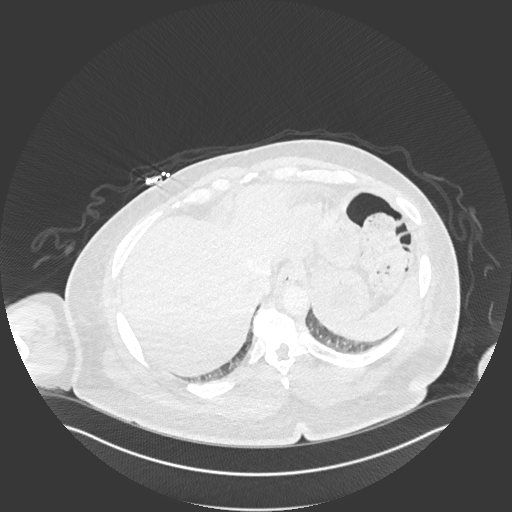
[im 68/337  mediastinal]
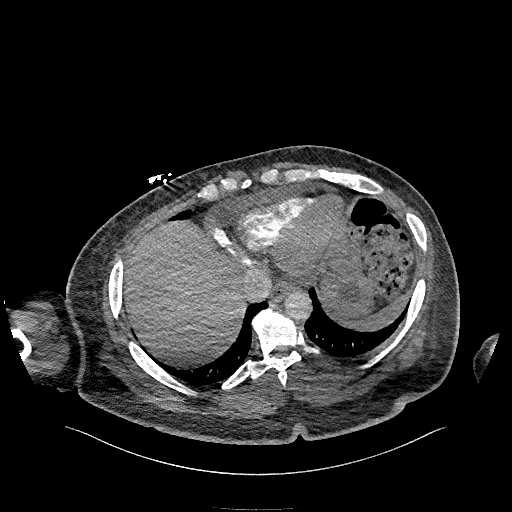
[im 101/337  lung]
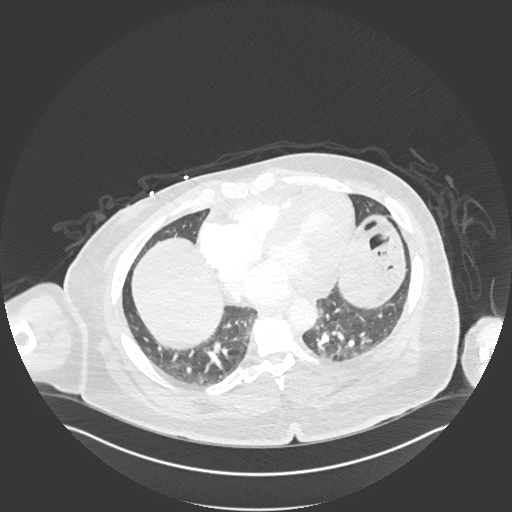
[im 118/337  mediastinal]
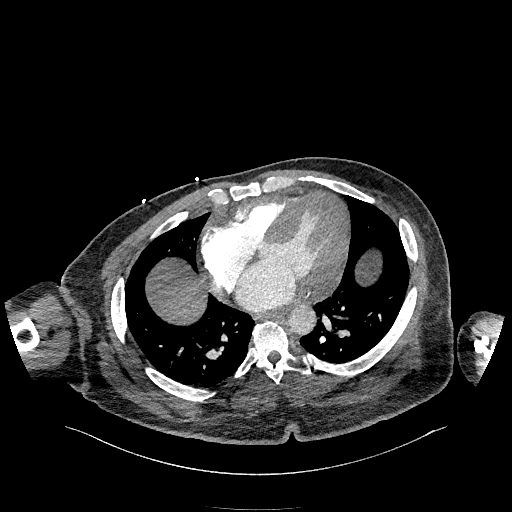
[im 135/337  lung]
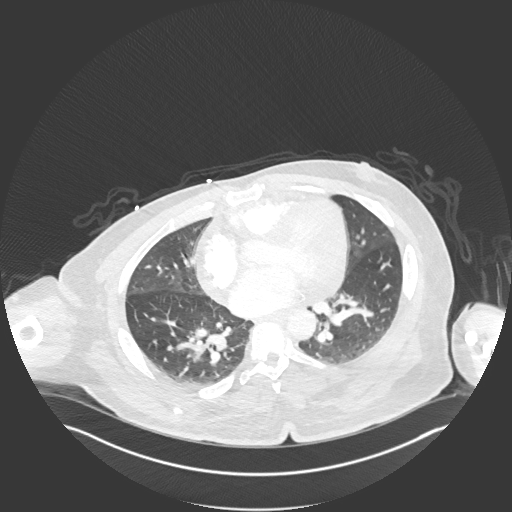
[im 152/337  mediastinal]
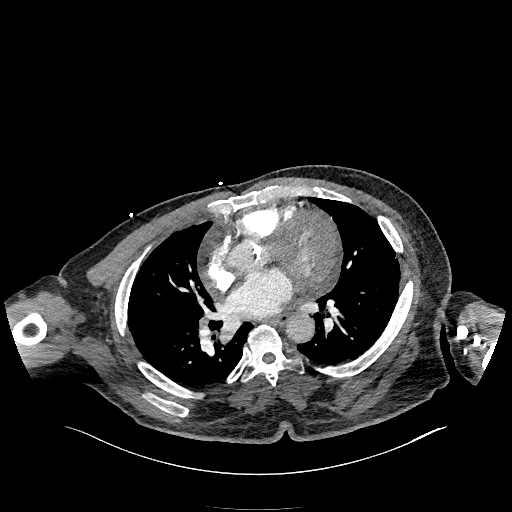
[im 169/337  lung]
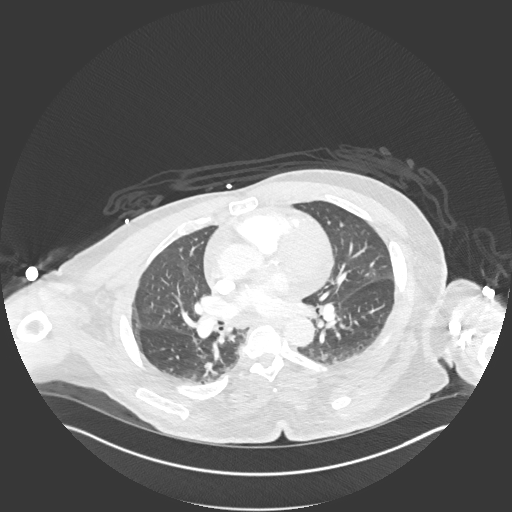
[im 185/337  mediastinal]
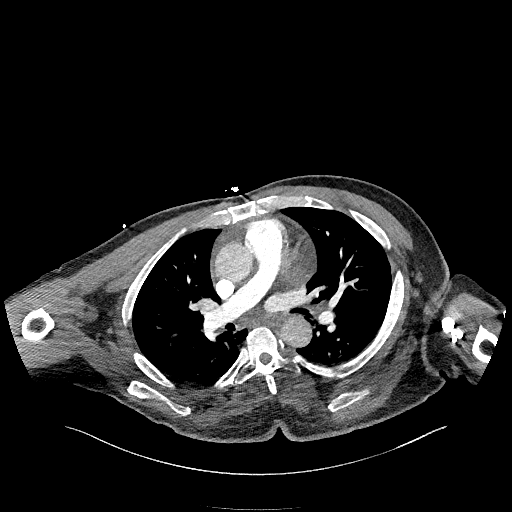
[im 202/337  lung]
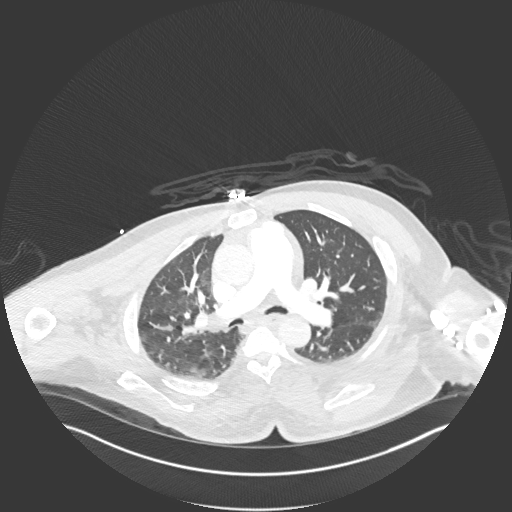
[im 219/337  mediastinal]
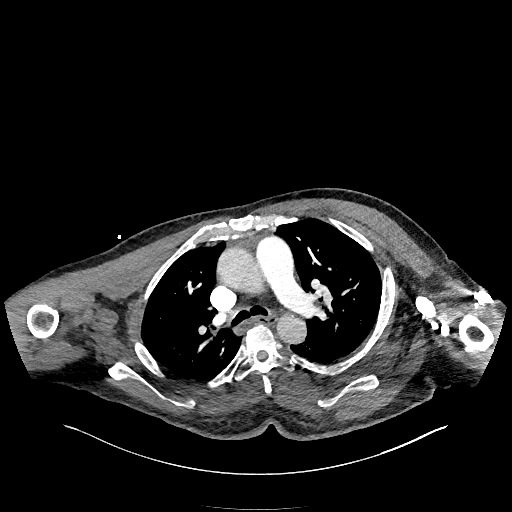
[im 236/337  lung]
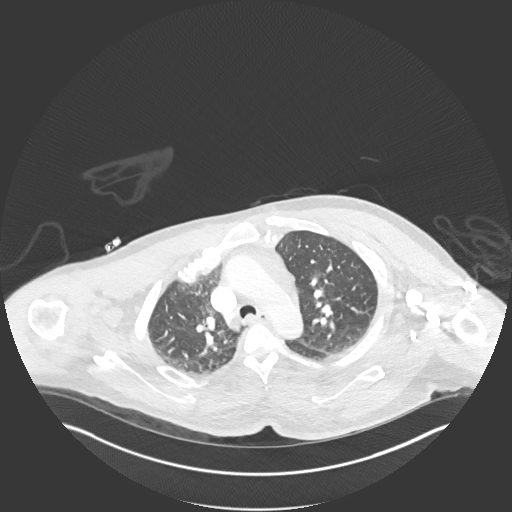
[im 269/337  mediastinal]
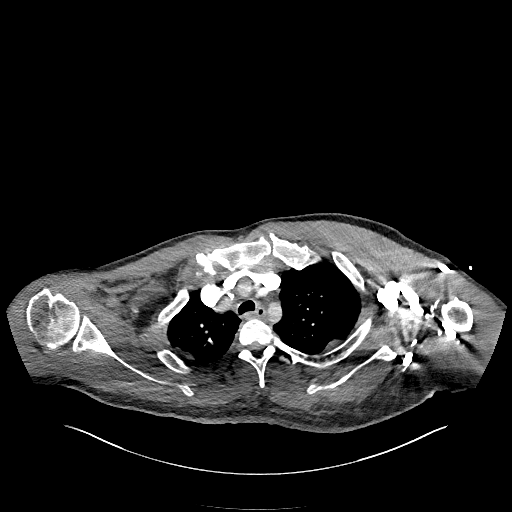
[im 286/337  lung]
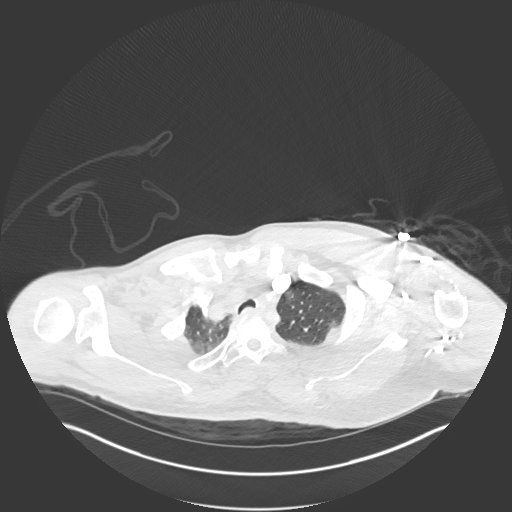
[im 303/337  mediastinal]
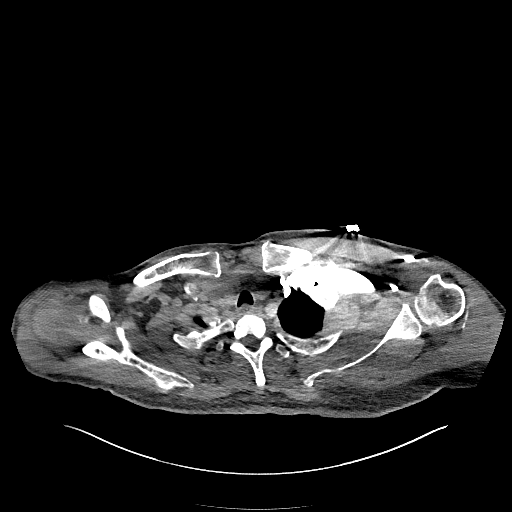
[im 320/337  lung]
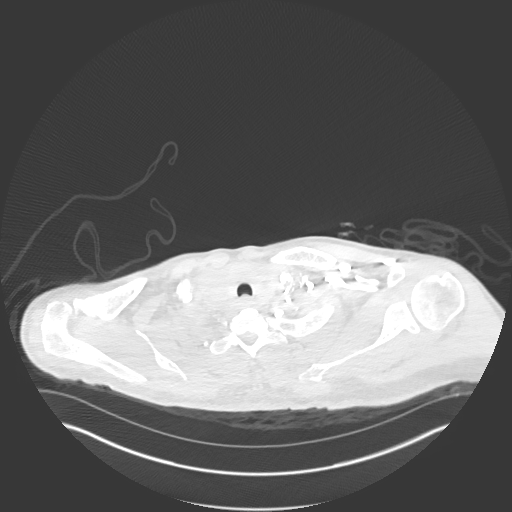

[Series 8: pe 2mm cor · coronal · 0.47mm/px · 1 of 127 slices shown]
[im 64/127  mediastinal]
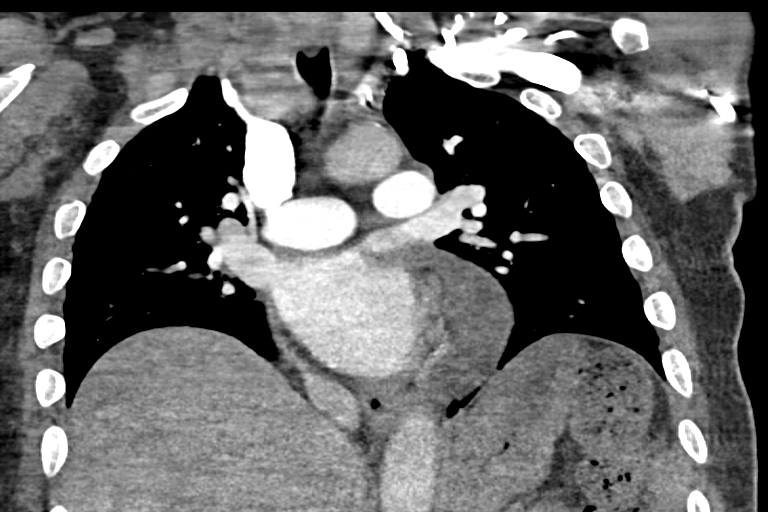

[18 of 36 positions shown; findings below may reference images not displayed]

FINDINGS: Cardiovascular: Linear apparent filling defects and right lower lobe
pulmonary vessels felt to be artifactual. No evidence of pulmonary
embolus. Small pericardial effusion. Mild cardiomegaly. Coronary
artery and aortic calcifications. No aneurysm.

Mediastinum/Nodes: No mediastinal, hilar, or axillary adenopathy.
Trachea and esophagus are unremarkable. Thyroid enlarged and
multinodular. Largest nodule in the right thyroid lobe measures
cm.

Lungs/Pleura: No confluent opacities or effusions.

Upper Abdomen: Imaging into the upper abdomen demonstrates no acute
findings.

Musculoskeletal: Mild bilateral gynecomastia. No acute bony
abnormality.

Review of the MIP images confirms the above findings.
IMPRESSION: No evidence of pulmonary embolus.

Mild cardiomegaly.  Small pericardial effusion.

Aortic atherosclerosis crash that coronary artery disease.

Enlarged multinodular thyroid. Recommend thyroid US (ref: [HOSPITAL]. [DATE]): 143-50).

Aortic Atherosclerosis ([id number]-0MS.S).

## 2021-08-21 IMAGING — CR DG CHEST 2V
2 series · 2 of 2 positions shown · non-contrast
Comparison: December 31, 2017

CLINICAL DATA: Chest pain.  History of high blood pressure.

EXAM:
CHEST - 2 VIEW

[chest lat]
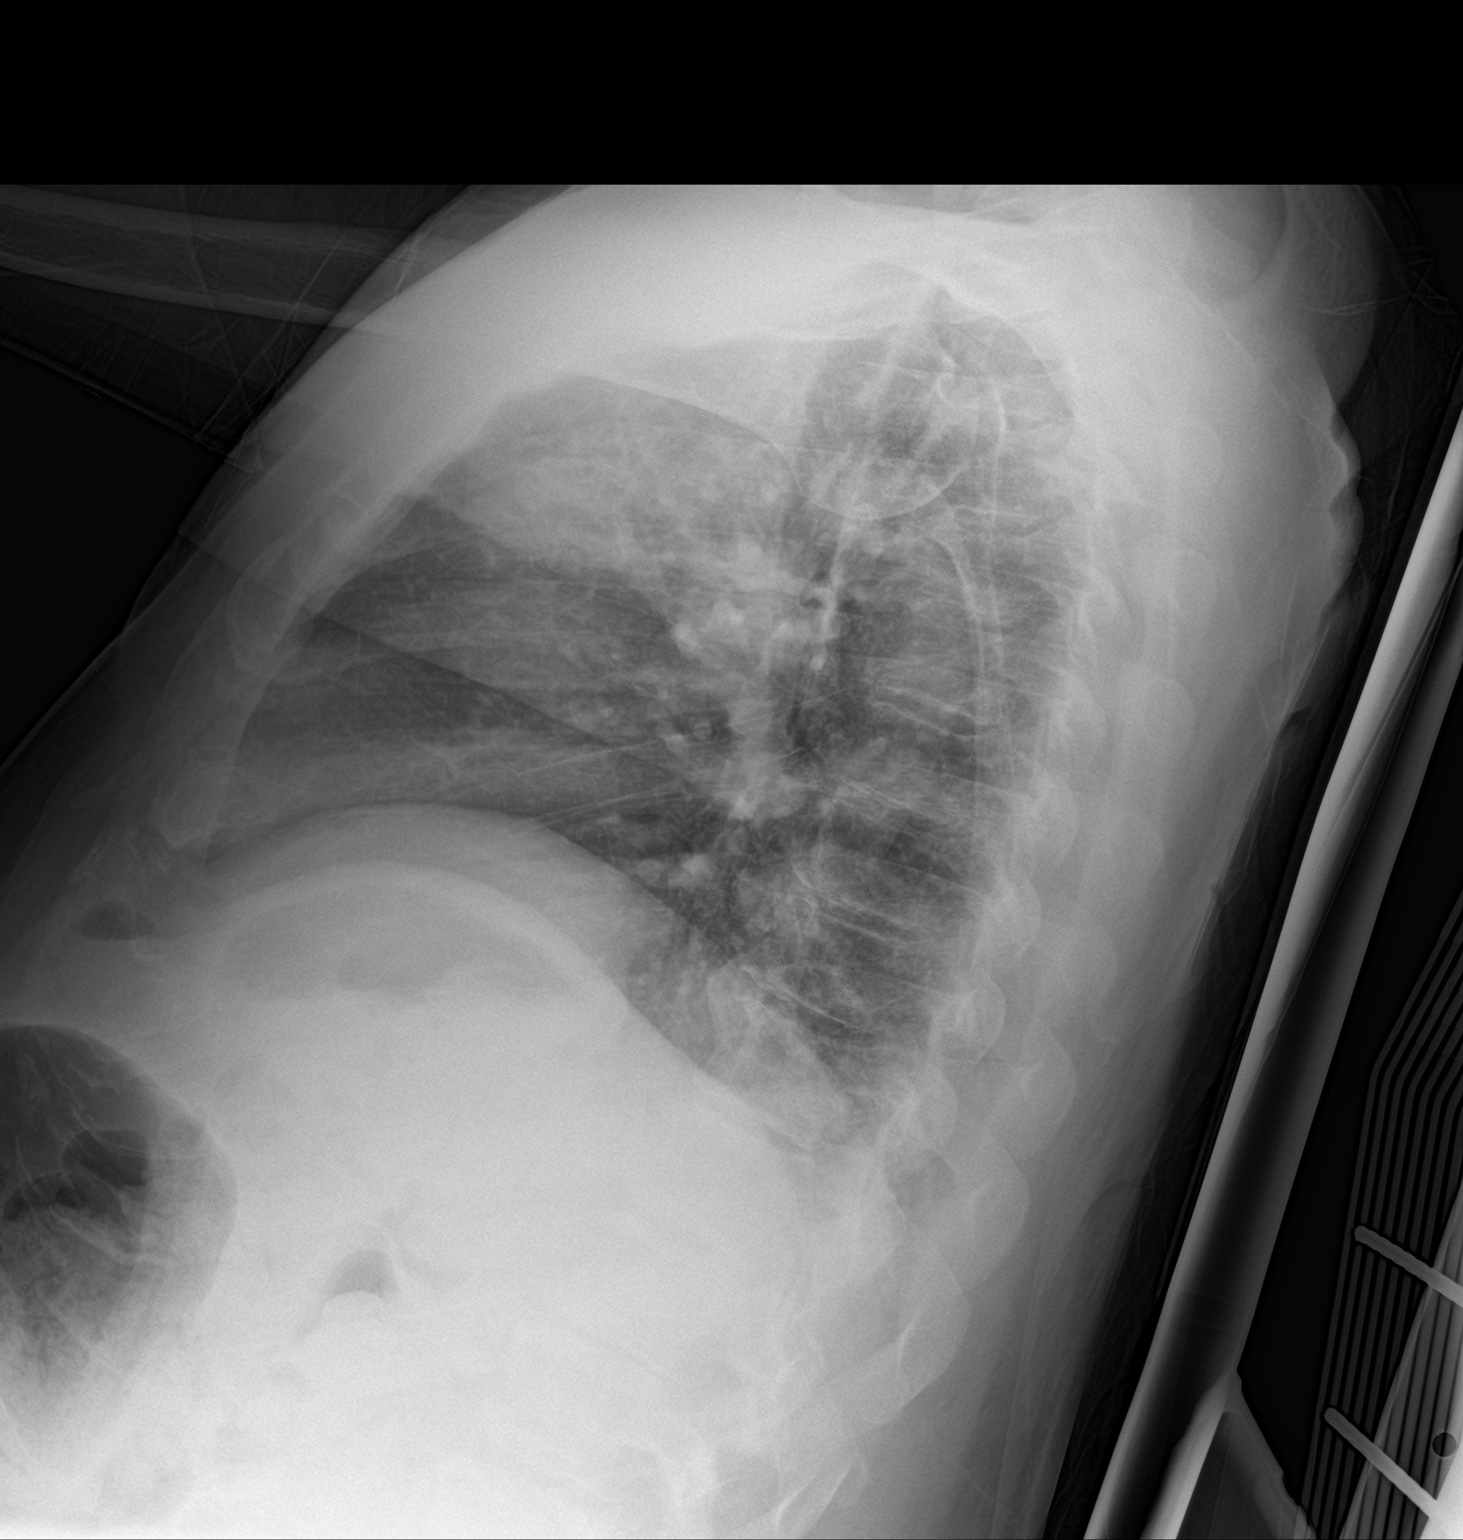

[chest ap]
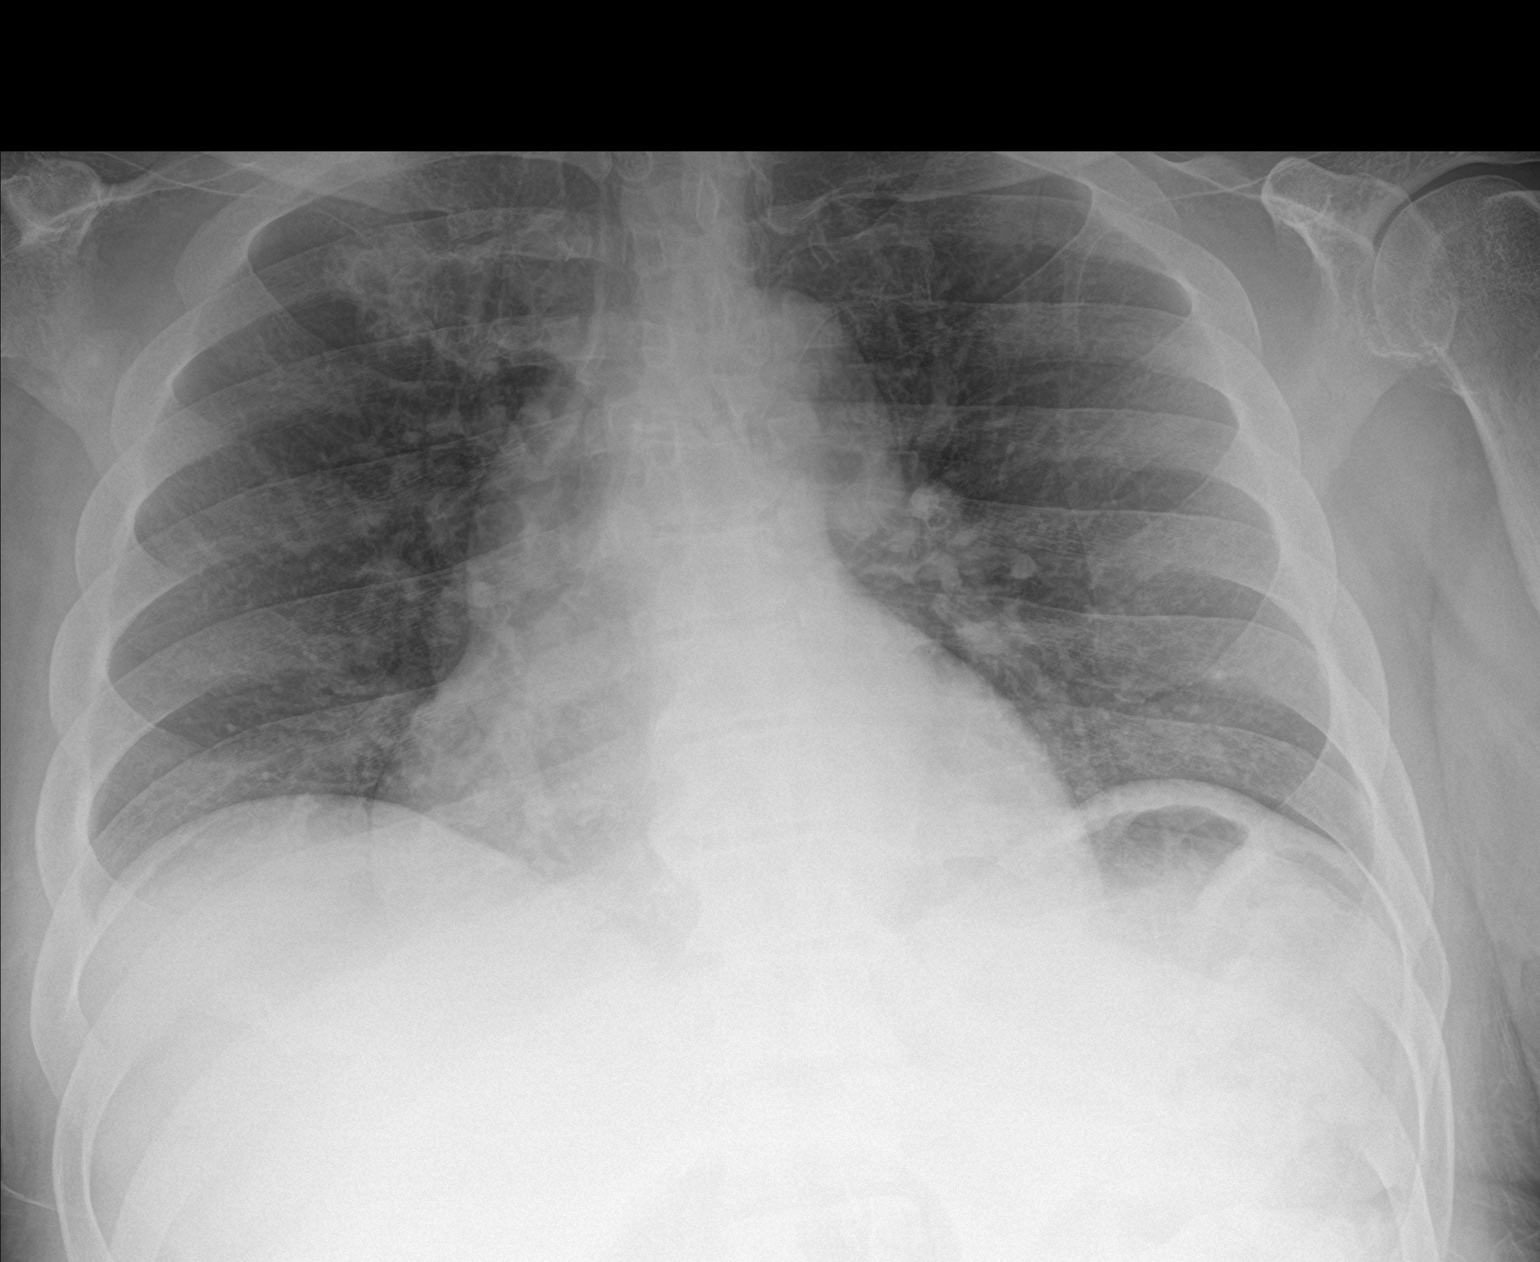

[2 of 2 positions shown; findings below may reference images not displayed]

FINDINGS: Stable cardiomegaly. A tortuous thoracic aorta is again identified.
The hila and mediastinum are otherwise normal. No pneumothorax. No
nodules or masses. No focal infiltrates or overt edema.
IMPRESSION: No active cardiopulmonary disease.

## 2021-08-25 IMAGING — CR DG CHEST 2V
2 series · 2 of 2 positions shown · non-contrast
Comparison: 12/12/2019

CLINICAL DATA: Shortness of breath and BILATERAL lower extremity
edema for 1 week; history CHF, diabetes mellitus, hypertension,
recent stroke

EXAM:
CHEST - 2 VIEW

[chest lat]
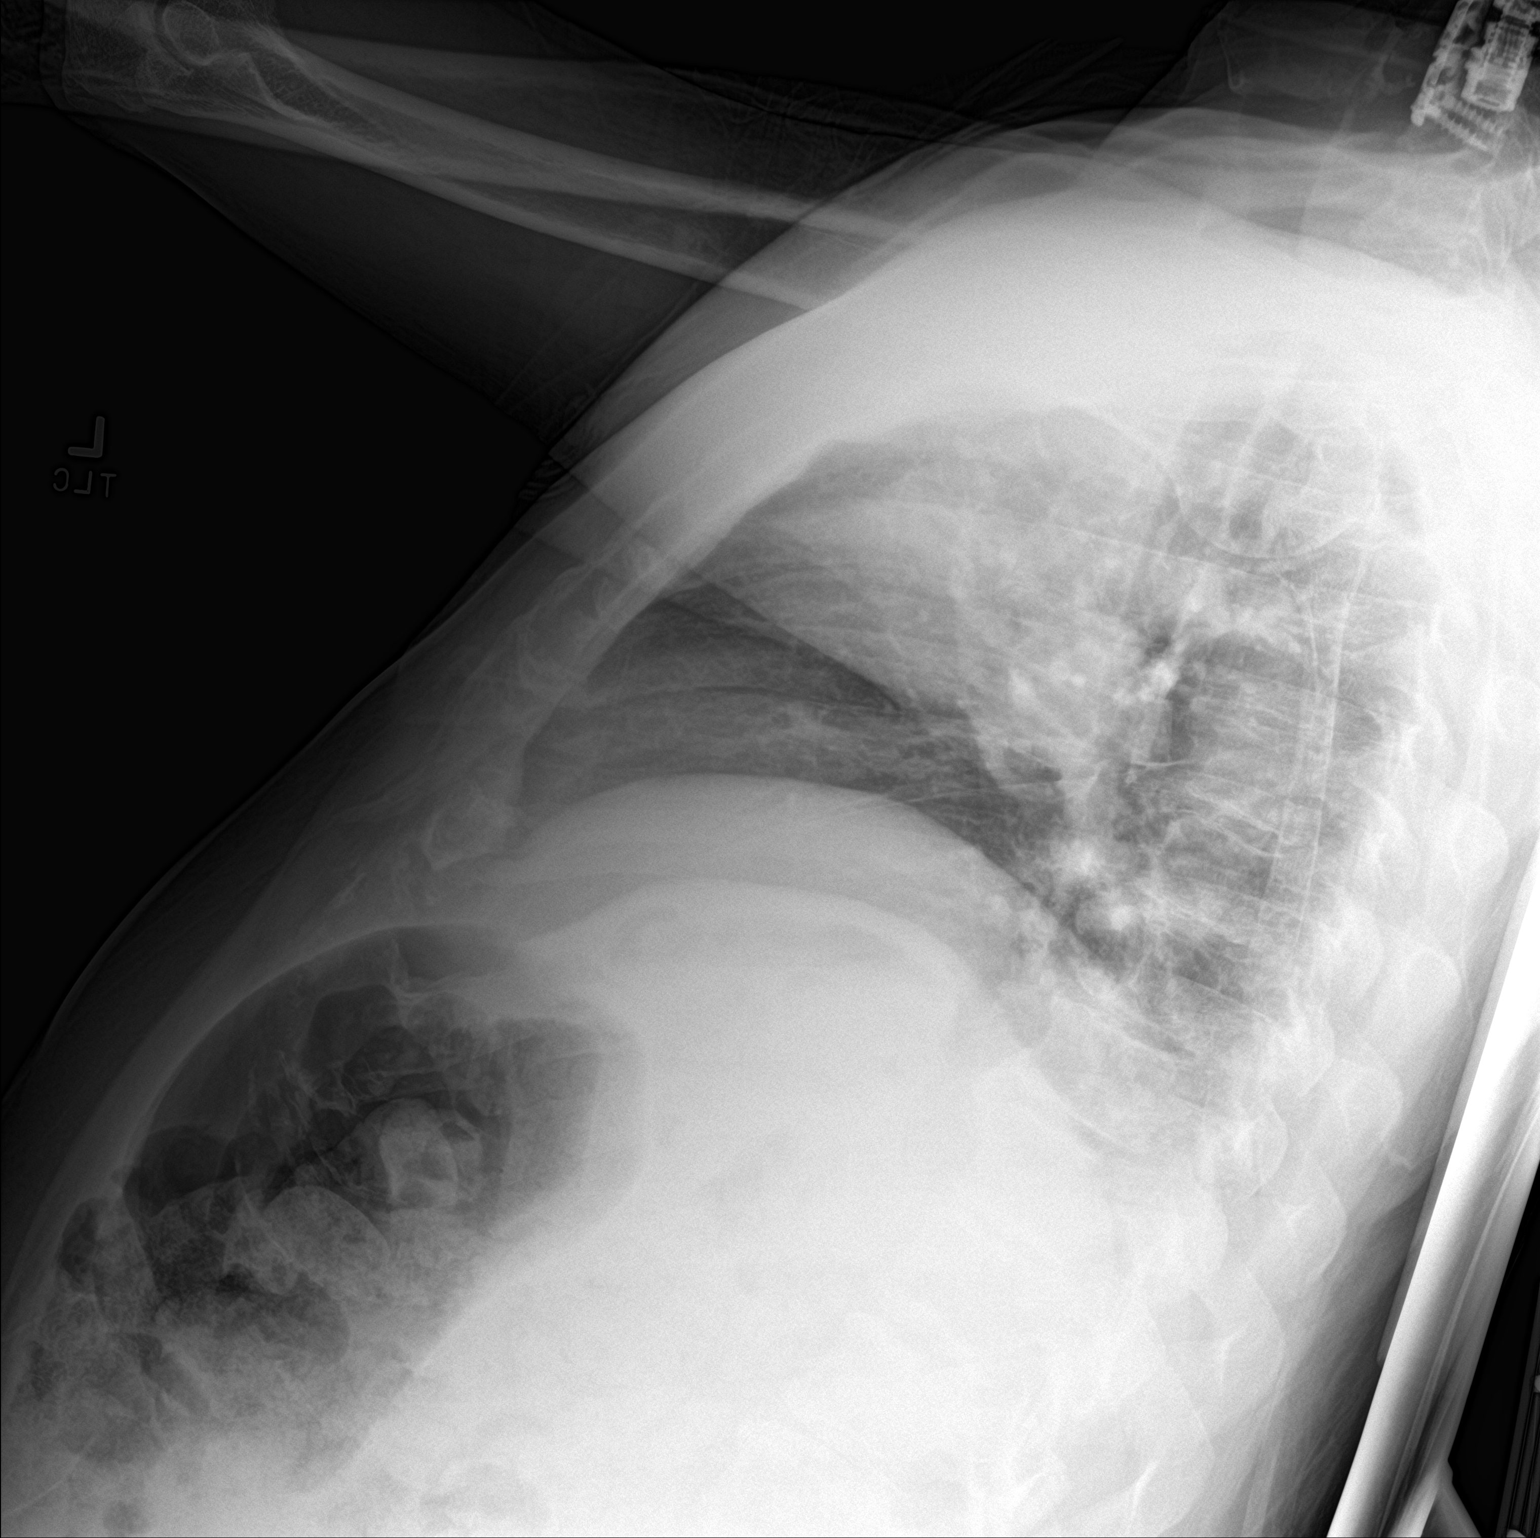

[chest ap]
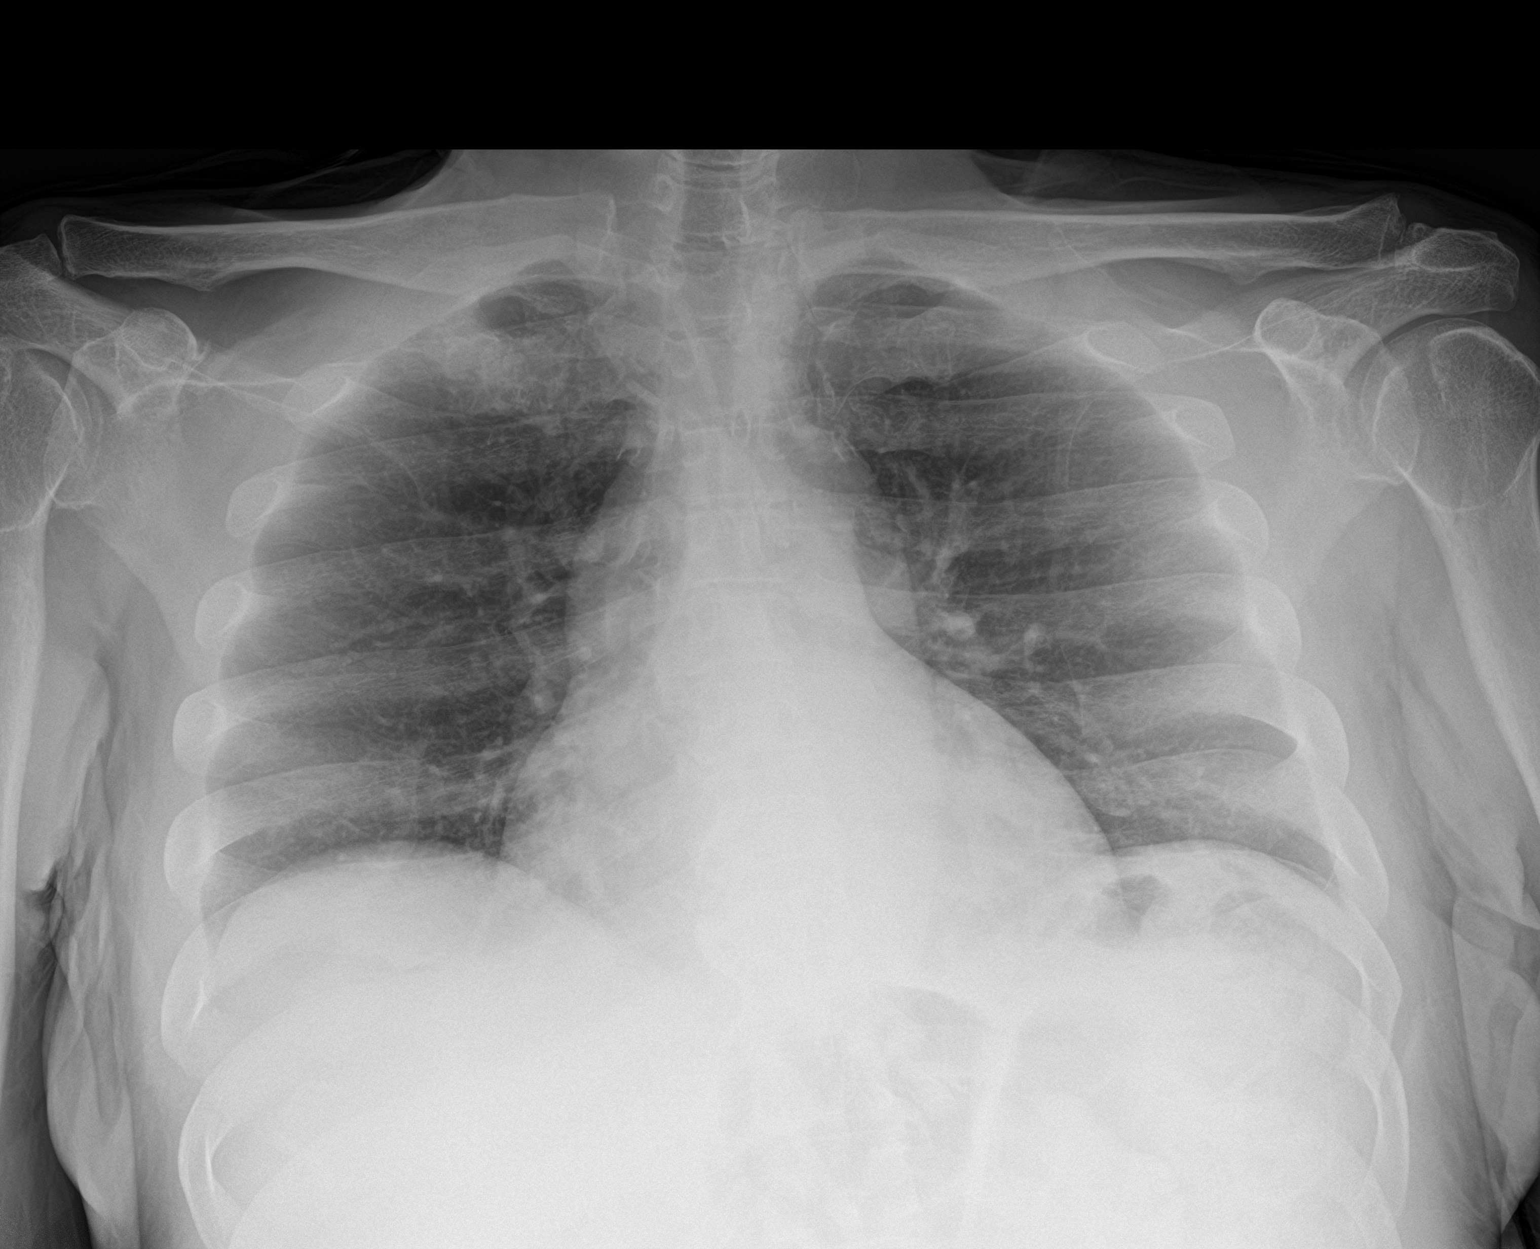

[2 of 2 positions shown; findings below may reference images not displayed]

FINDINGS: Upper normal heart size.

Mediastinal contours and pulmonary vascularity normal.

Lungs clear.

No infiltrate, pleural effusion, or pneumothorax.

Prior cervical fusion.
IMPRESSION: No acute abnormalities.

## 2021-08-27 DIAGNOSIS — R69 Illness, unspecified: Secondary | ICD-10-CM | POA: Diagnosis not present

## 2021-08-27 DIAGNOSIS — F321 Major depressive disorder, single episode, moderate: Secondary | ICD-10-CM | POA: Diagnosis not present

## 2021-08-30 DIAGNOSIS — N183 Chronic kidney disease, stage 3 unspecified: Secondary | ICD-10-CM | POA: Diagnosis not present

## 2021-08-30 DIAGNOSIS — I10 Essential (primary) hypertension: Secondary | ICD-10-CM | POA: Diagnosis not present

## 2021-08-30 DIAGNOSIS — I5030 Unspecified diastolic (congestive) heart failure: Secondary | ICD-10-CM | POA: Diagnosis not present

## 2021-08-30 DIAGNOSIS — E119 Type 2 diabetes mellitus without complications: Secondary | ICD-10-CM | POA: Diagnosis not present

## 2021-09-03 IMAGING — CR DG KNEE COMPLETE 4+V*L*
4 series · 4 of 4 positions shown · non-contrast
Comparison: None.

CLINICAL DATA: Fluid overload, bilateral leg swelling

EXAM:
LEFT KNEE - COMPLETE 4+ VIEW

[x knee ap left (1 of 3)]
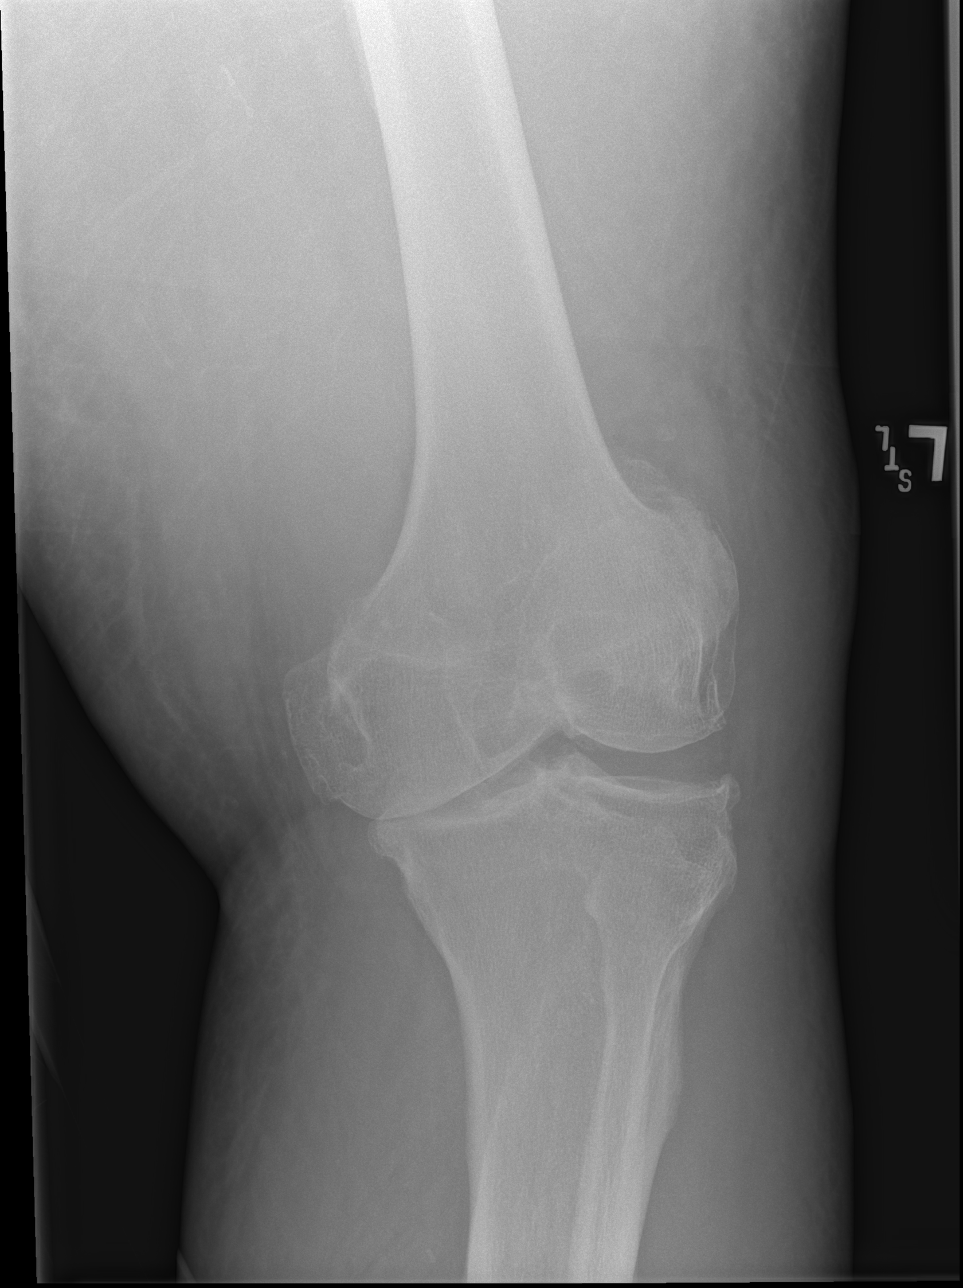

[x knee ap left (2 of 3)]
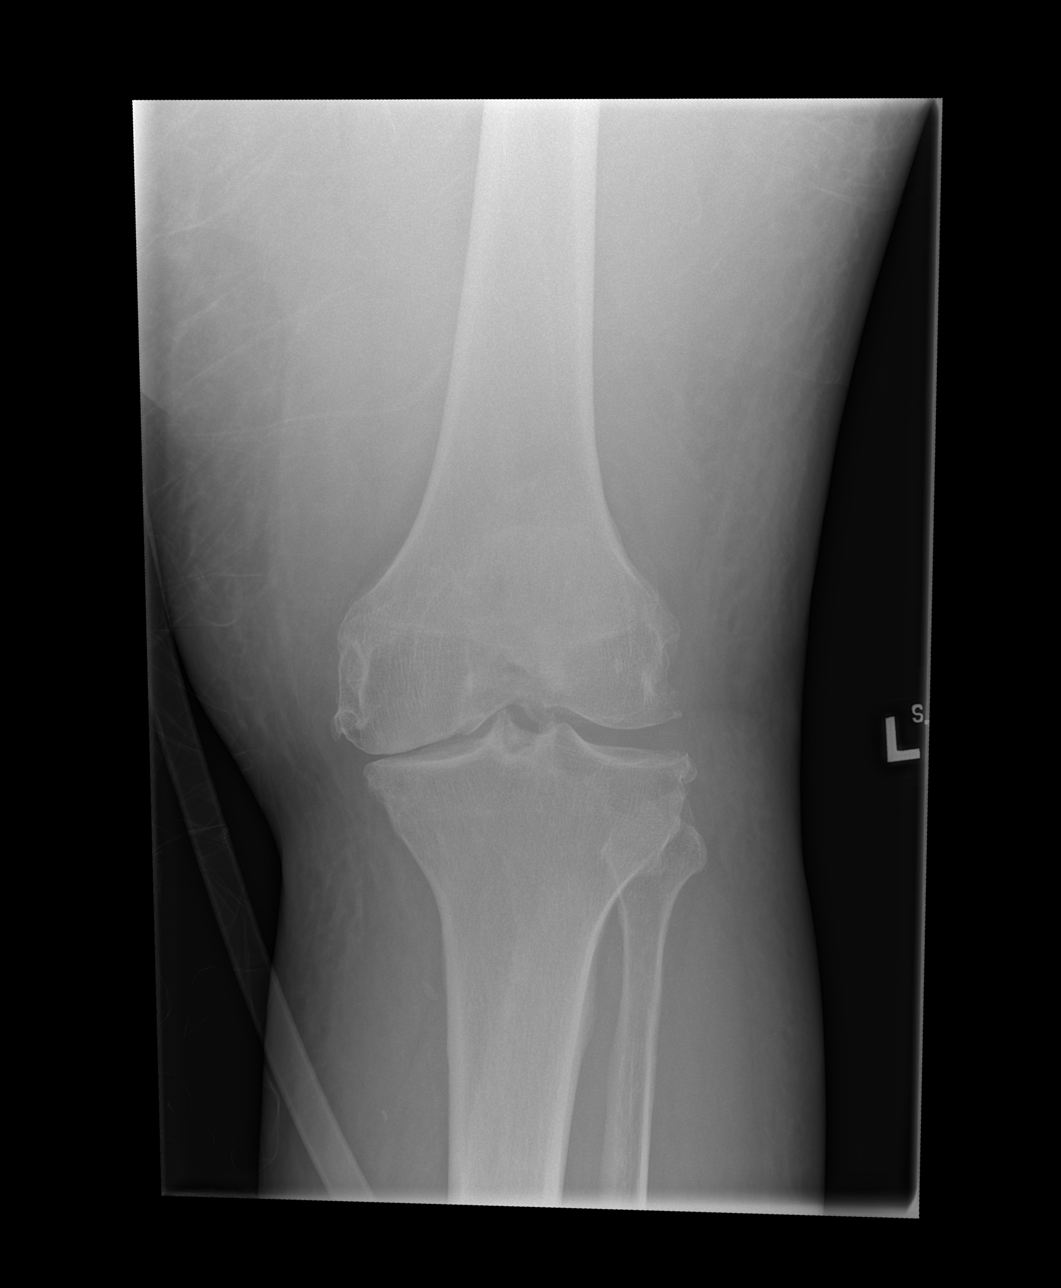

[x knee ap left (3 of 3)]
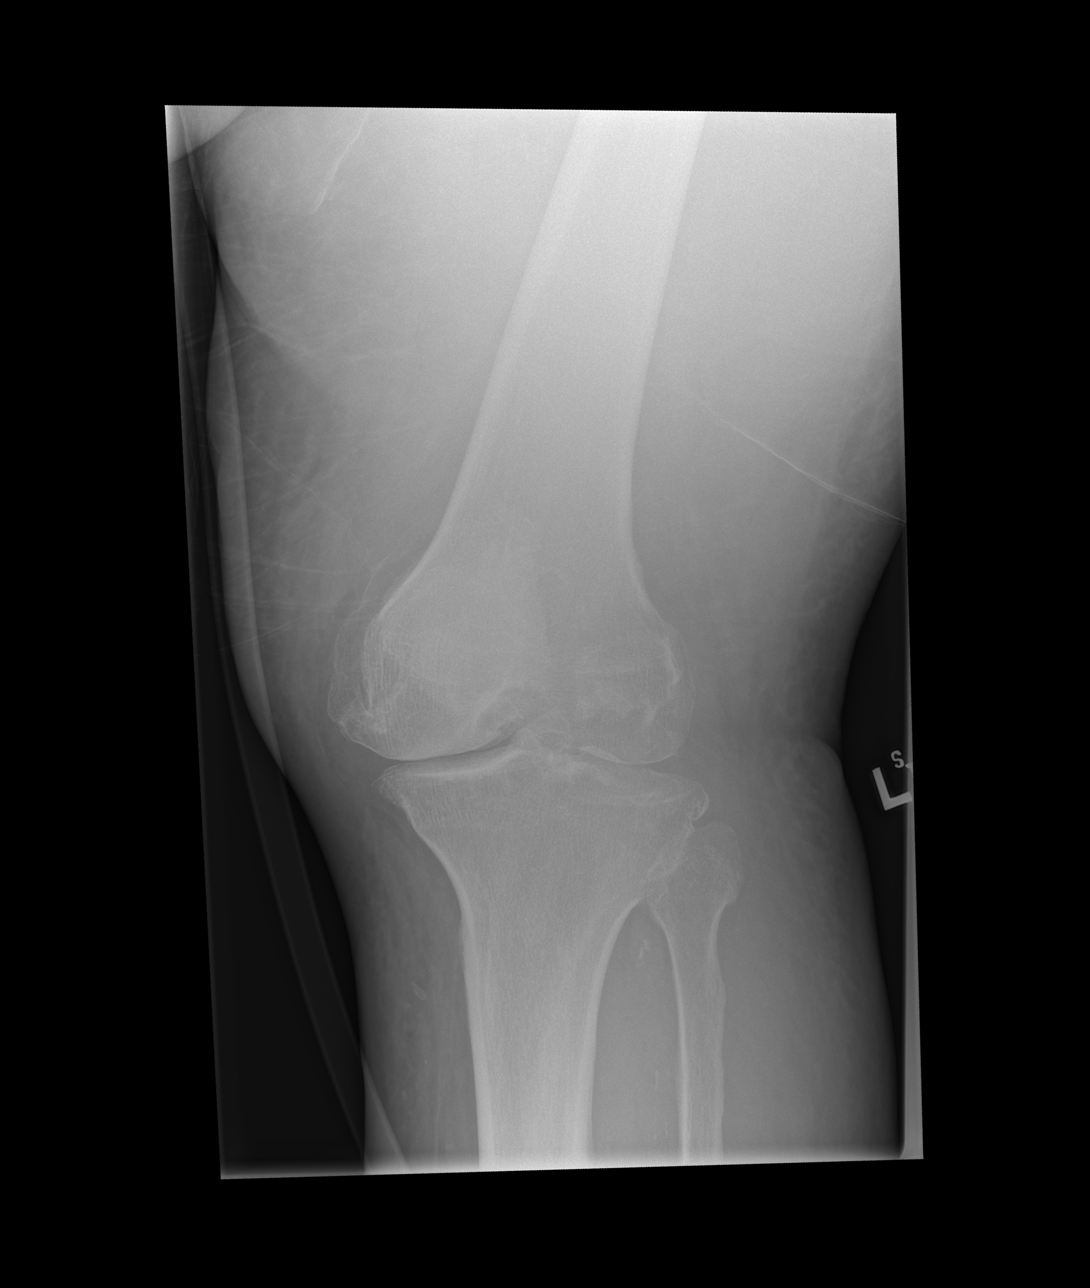

[x knee lat left]
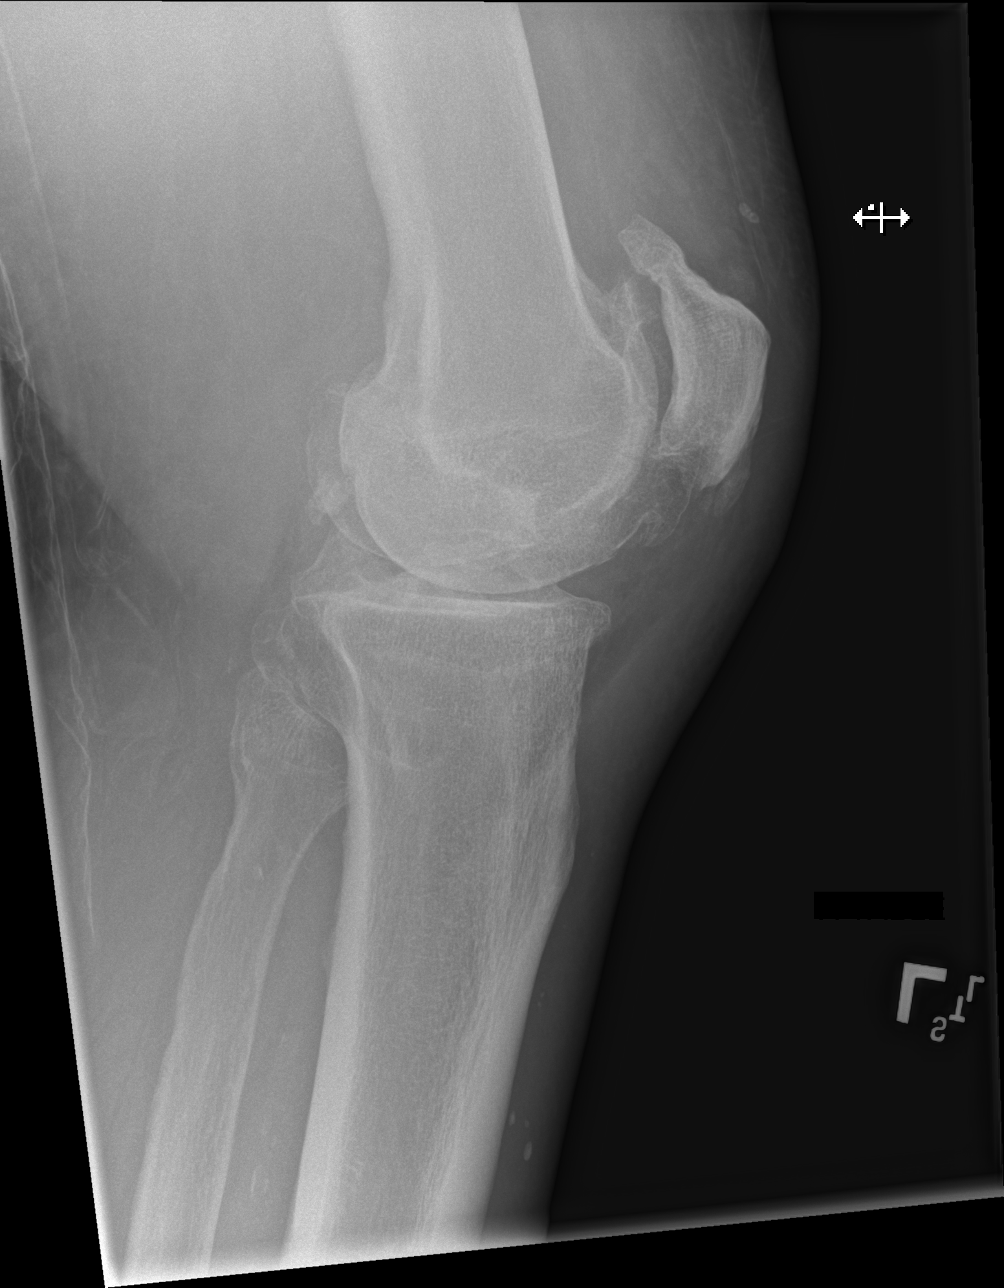

[4 of 4 positions shown; findings below may reference images not displayed]

FINDINGS: Tricompartment degenerative changes, most pronounced in the medial
and patellofemoral compartments. No joint effusion. No acute bony
abnormality. Specifically, no fracture, subluxation, or dislocation.
IMPRESSION: Moderate tricompartment degenerative changes. No acute bony
abnormality.

## 2021-09-03 IMAGING — CR DG CHEST 1V PORT
1 series · 1 of 1 positions shown · non-contrast
Comparison: 12/16/2019

CLINICAL DATA: Fluid overload, crackles

EXAM:
PORTABLE CHEST 1 VIEW

[x chest ap]
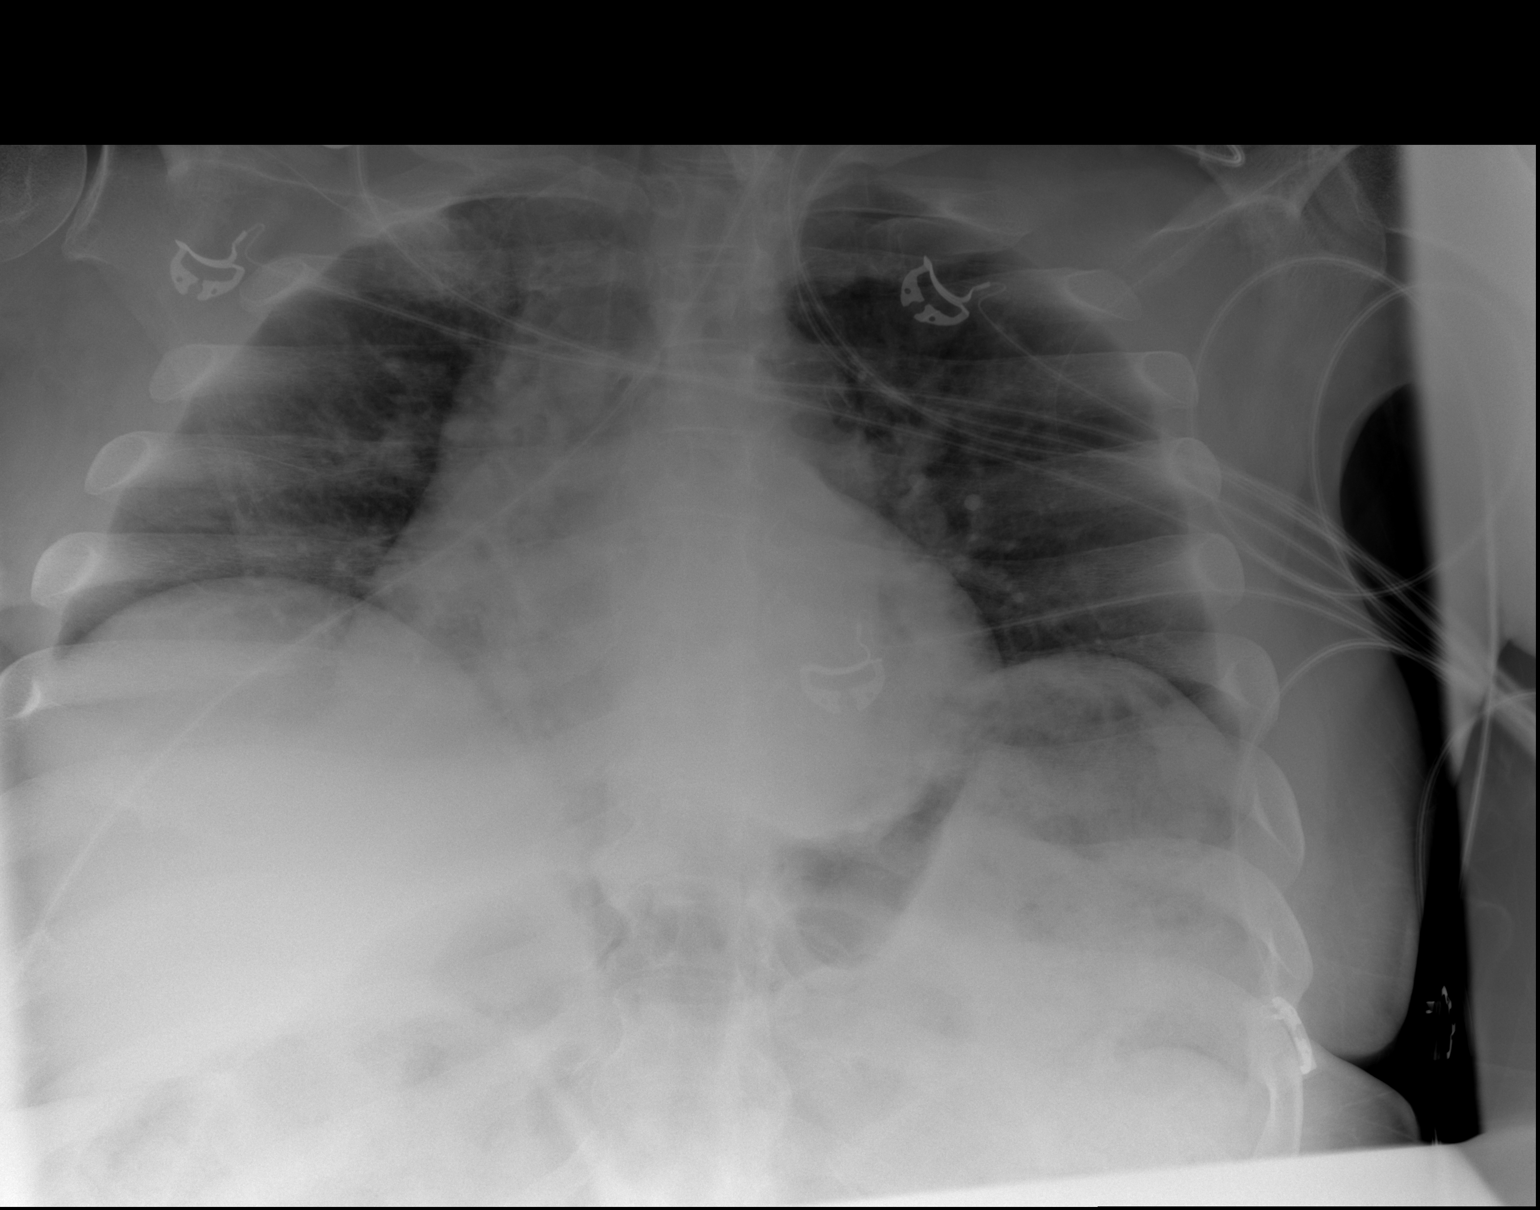

[1 of 1 positions shown; findings below may reference images not displayed]

FINDINGS: Very low lung volumes. Cardiomegaly with vascular congestion.
Bibasilar atelectasis. No overt edema or effusions. No acute bony
abnormality.
IMPRESSION: Very low lung volumes.  Bibasilar atelectasis.

Cardiomegaly, vascular congestion.

## 2021-09-03 IMAGING — CR DG KNEE COMPLETE 4+V*R*
4 series · 4 of 4 positions shown · non-contrast
Comparison: None.

CLINICAL DATA: Fluid overload.  Bilateral leg swelling.

EXAM:
RIGHT KNEE - COMPLETE 4+ VIEW

[x knee ap right (1 of 3)]
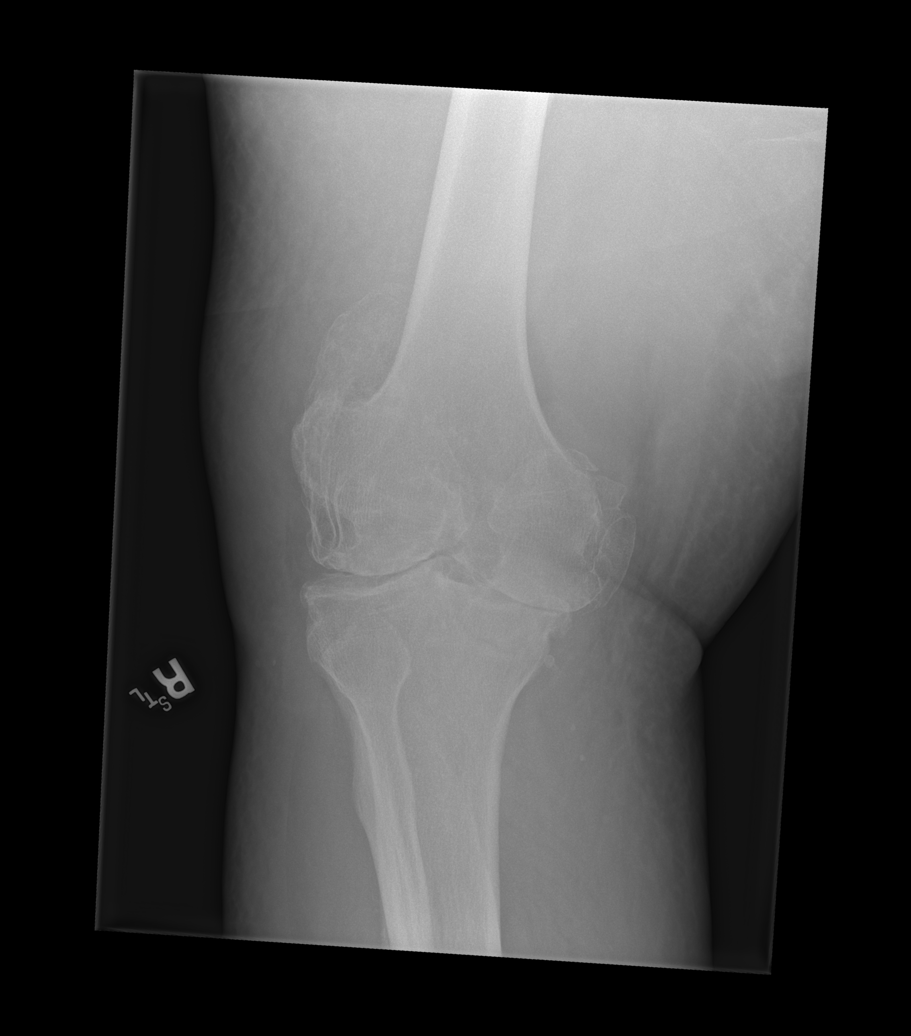

[x knee ap right (2 of 3)]
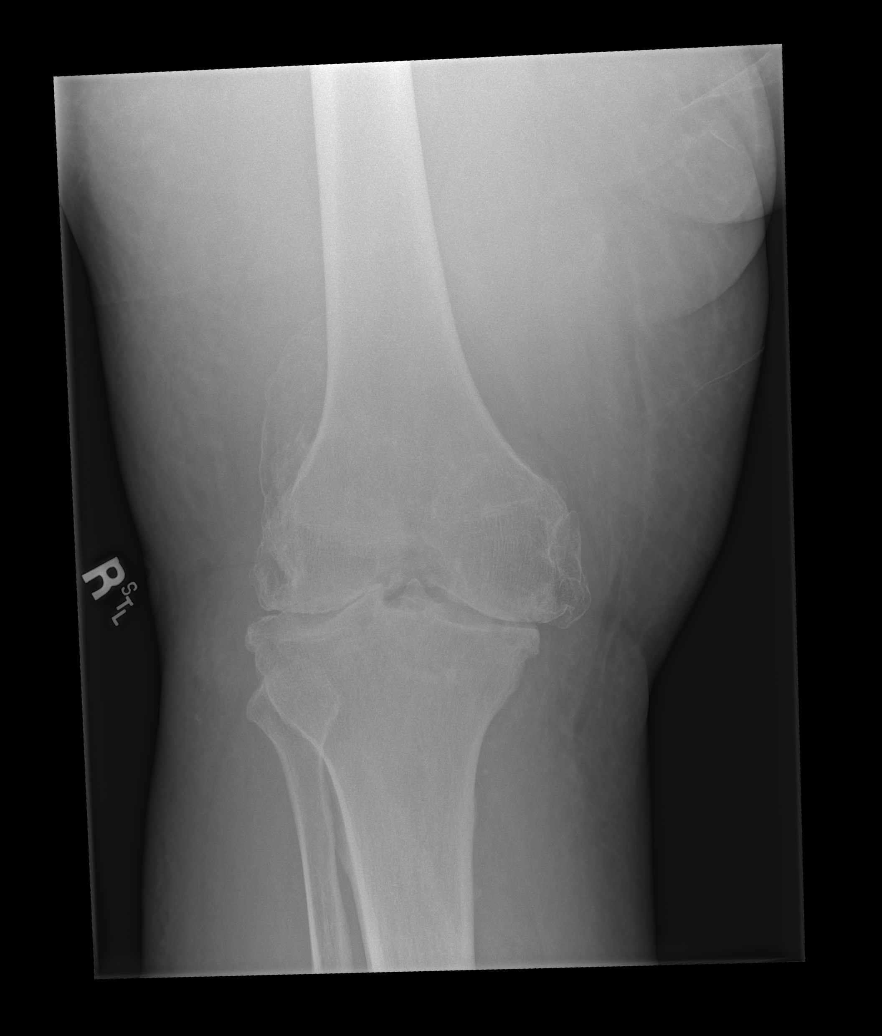

[x knee ap right (3 of 3)]
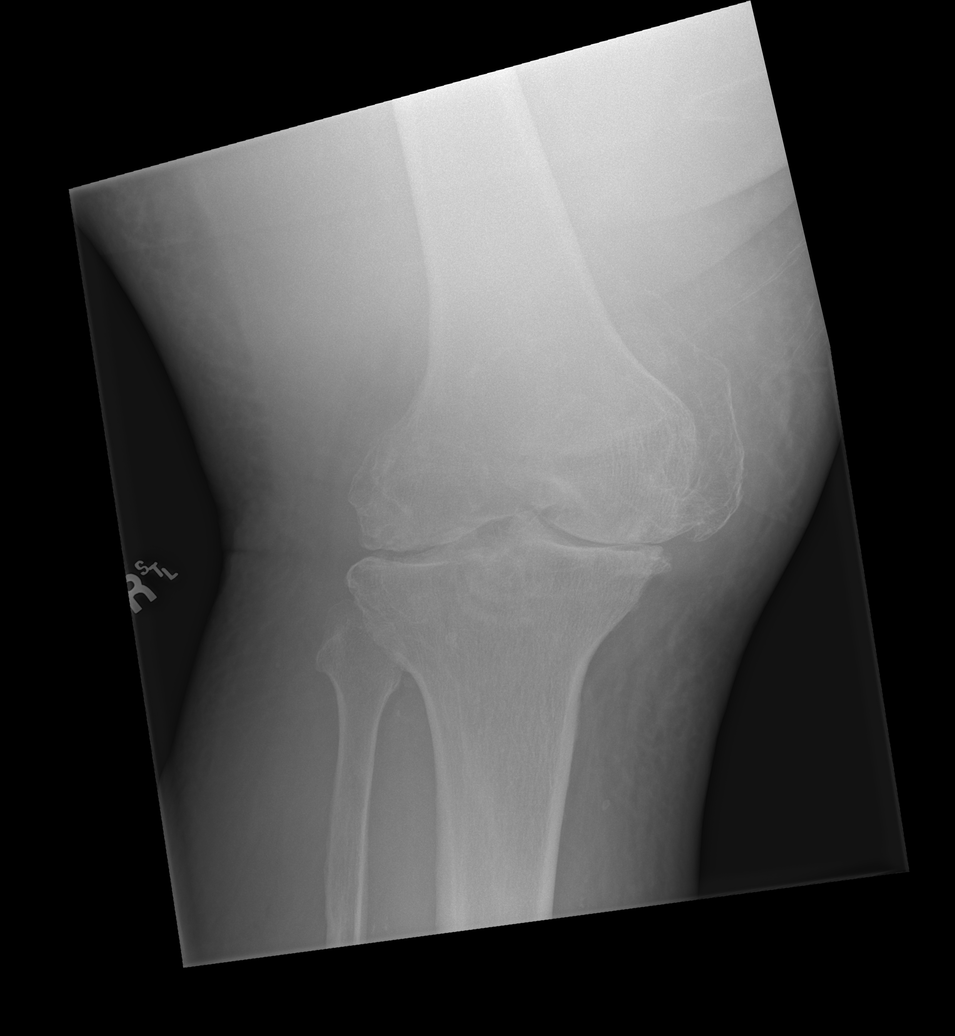

[x knee lat right]
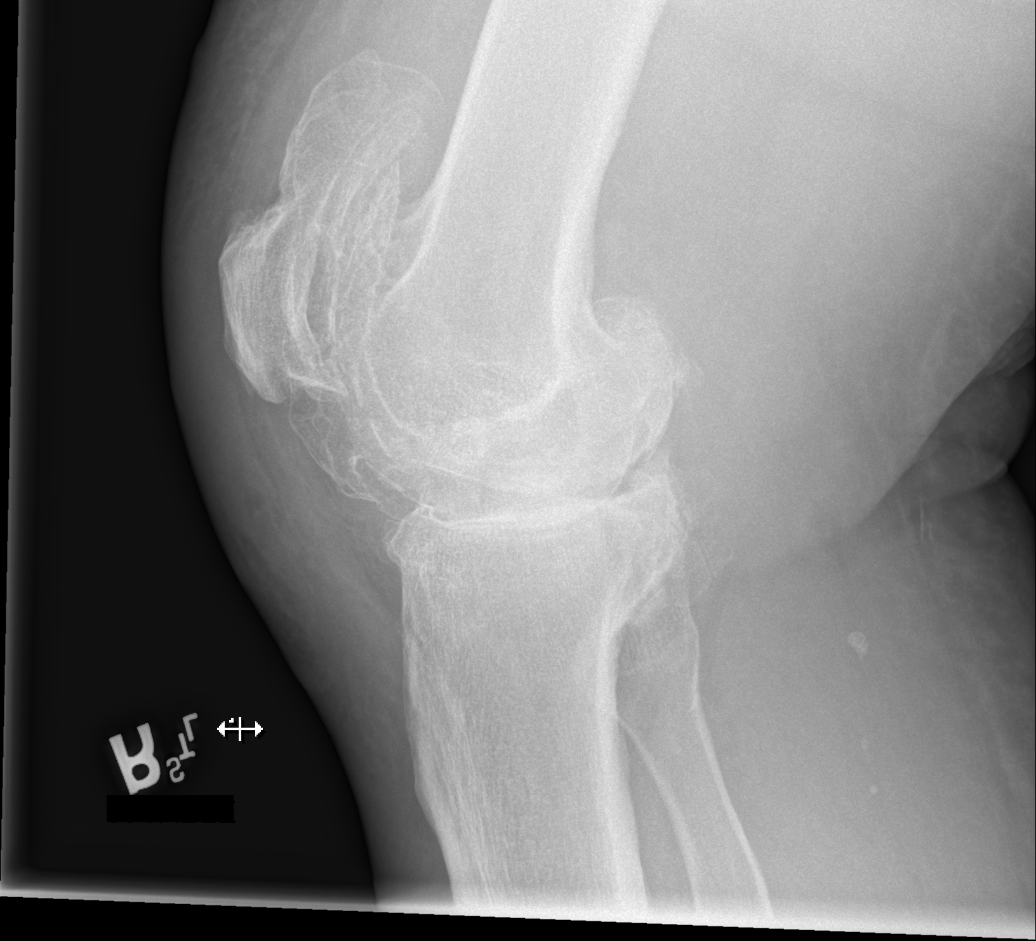

[4 of 4 positions shown; findings below may reference images not displayed]

FINDINGS: Advanced tricompartment degenerative changes. Large osteophytes seen
in the patellofemoral compartment. No acute bony abnormality.
Specifically, no fracture, subluxation, or dislocation.
IMPRESSION: Advanced degenerative changes.  No acute bony abnormality.

## 2021-09-05 DIAGNOSIS — Z89512 Acquired absence of left leg below knee: Secondary | ICD-10-CM | POA: Diagnosis not present

## 2021-09-09 DIAGNOSIS — K5981 Ogilvie syndrome: Secondary | ICD-10-CM | POA: Diagnosis not present

## 2021-09-09 DIAGNOSIS — I10 Essential (primary) hypertension: Secondary | ICD-10-CM | POA: Diagnosis not present

## 2021-09-09 DIAGNOSIS — M6281 Muscle weakness (generalized): Secondary | ICD-10-CM | POA: Diagnosis not present

## 2021-09-09 DIAGNOSIS — Z89512 Acquired absence of left leg below knee: Secondary | ICD-10-CM | POA: Diagnosis not present

## 2021-09-09 DIAGNOSIS — N1831 Chronic kidney disease, stage 3a: Secondary | ICD-10-CM | POA: Diagnosis not present

## 2021-09-09 IMAGING — CR DG CHEST 2V
2 series · 2 of 2 positions shown · non-contrast
Comparison: Radiograph 12/25/2019.  CT 12/12/2019

CLINICAL DATA: Shortness of breath.  Generalized body aches.

EXAM:
CHEST - 2 VIEW

[chest lat]
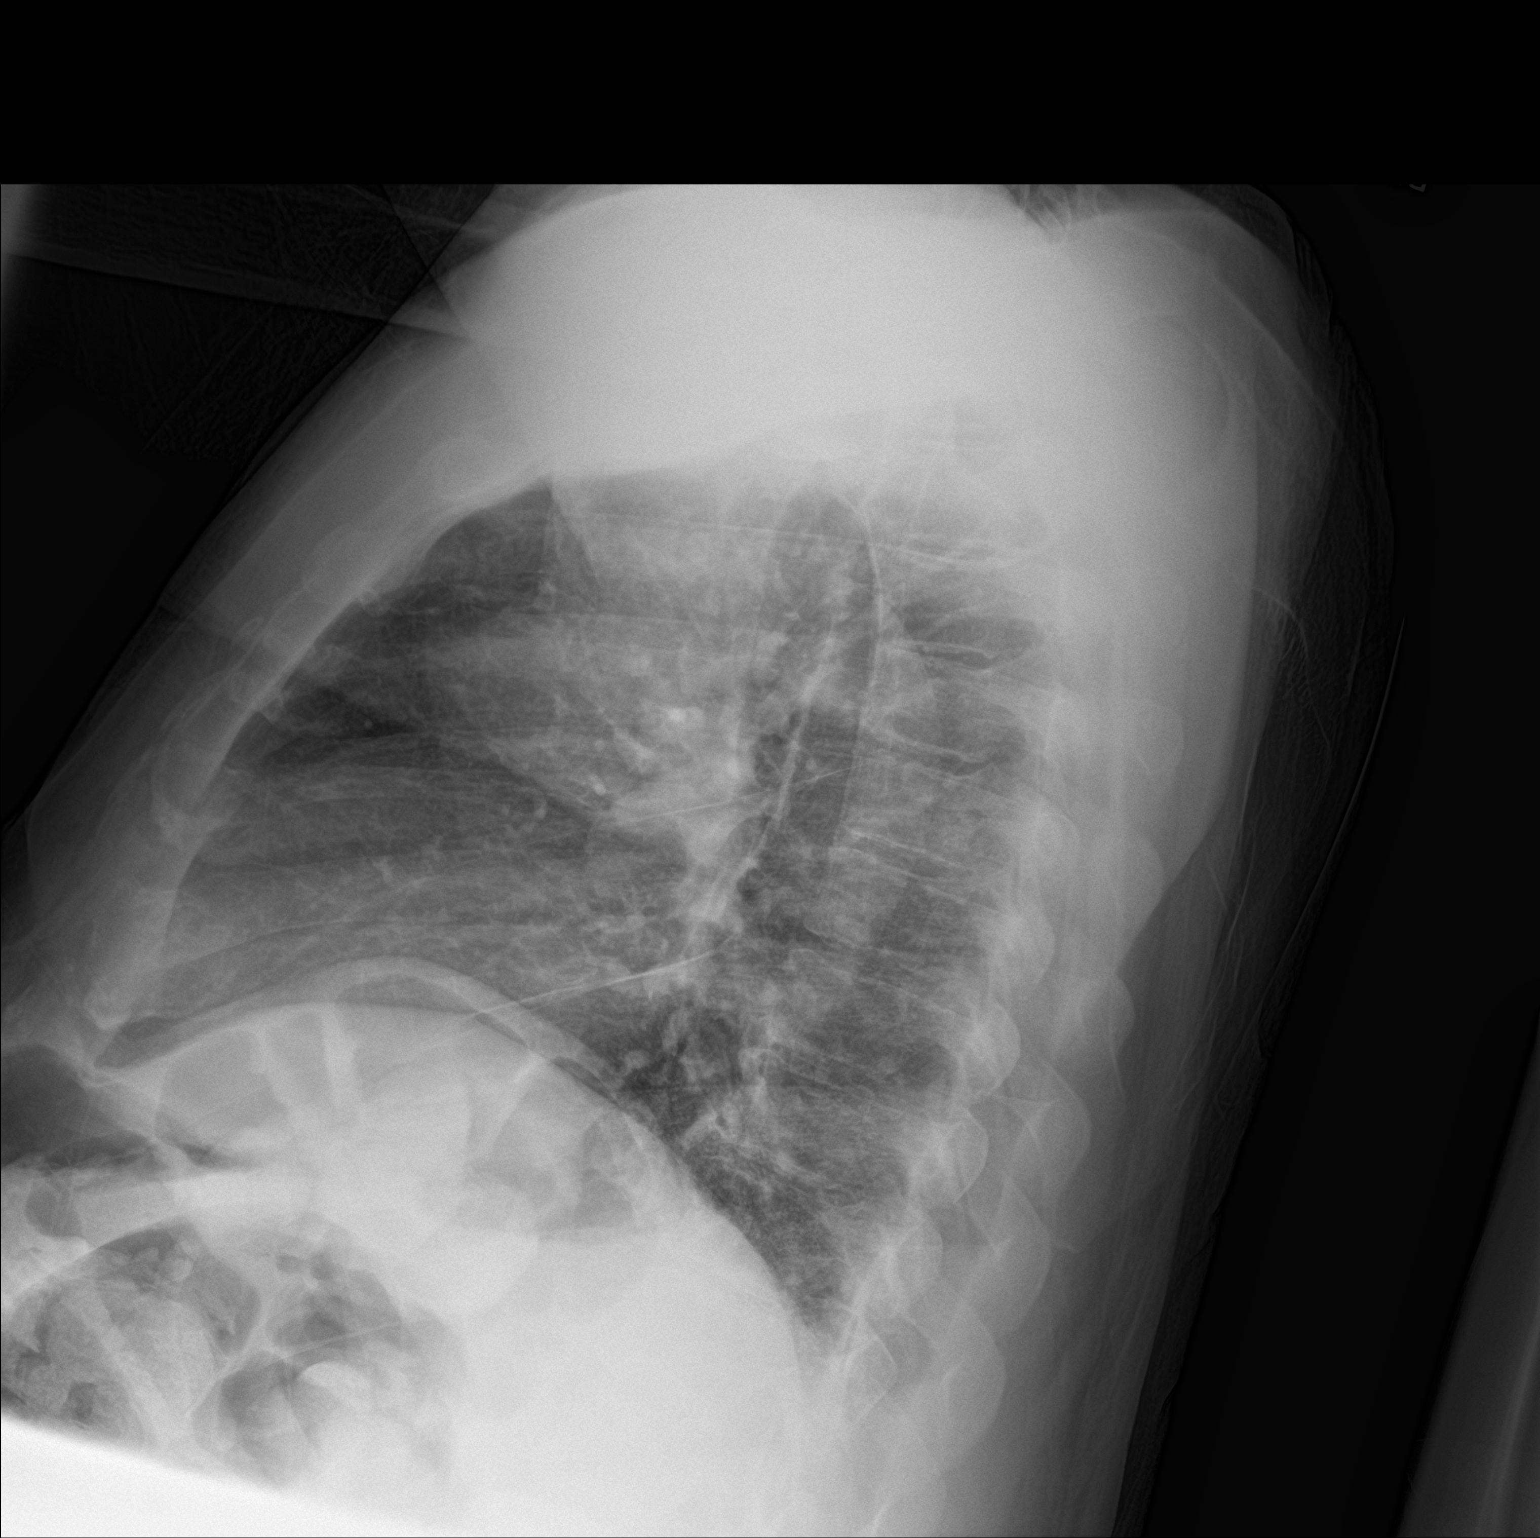

[chest ap]
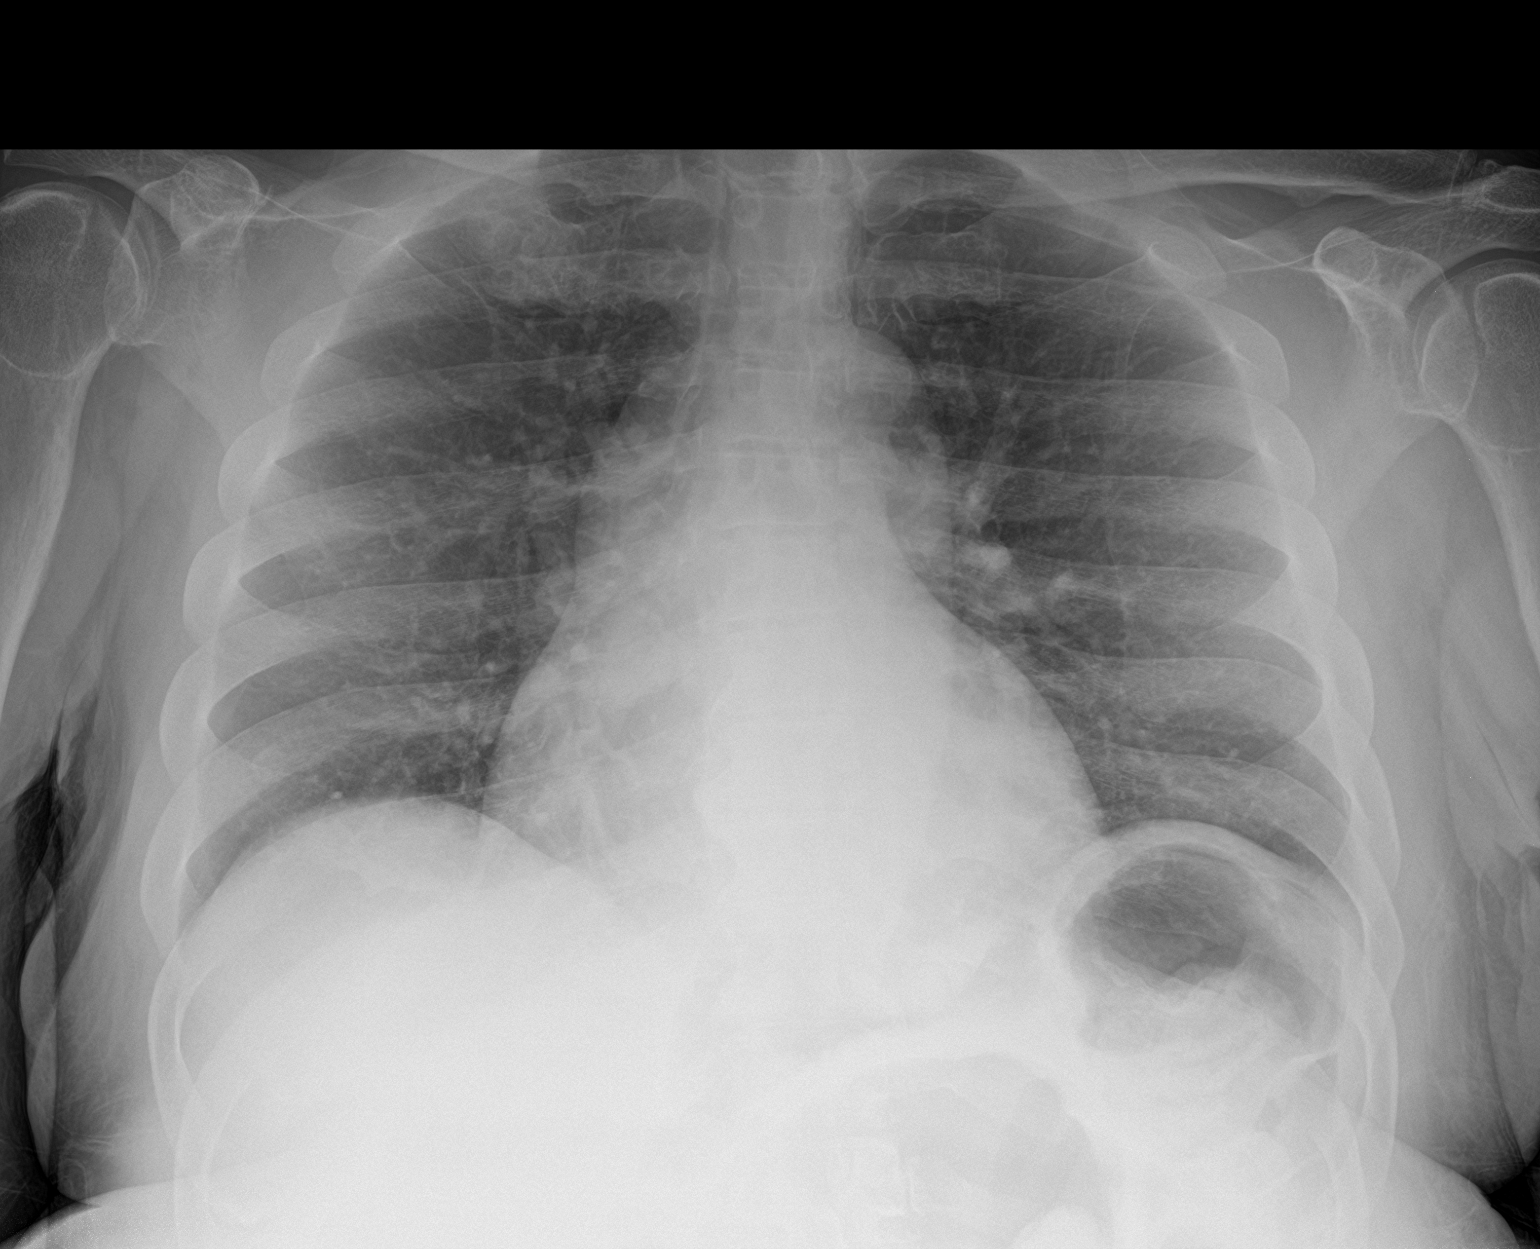

[2 of 2 positions shown; findings below may reference images not displayed]

FINDINGS: Improved lung volumes from prior exam. Chronic cardiomegaly. No
pulmonary edema. No focal airspace disease. Trace fluid in the
fissures without significant sub pulmonic effusion. No confluent
consolidation. No pneumothorax. No acute osseous abnormalities are
seen.
IMPRESSION: Chronic cardiomegaly. No acute chest findings.

## 2021-09-12 DIAGNOSIS — I10 Essential (primary) hypertension: Secondary | ICD-10-CM | POA: Diagnosis not present

## 2021-09-12 DIAGNOSIS — E1140 Type 2 diabetes mellitus with diabetic neuropathy, unspecified: Secondary | ICD-10-CM | POA: Diagnosis not present

## 2021-09-12 DIAGNOSIS — I5030 Unspecified diastolic (congestive) heart failure: Secondary | ICD-10-CM | POA: Diagnosis not present

## 2021-09-12 DIAGNOSIS — I639 Cerebral infarction, unspecified: Secondary | ICD-10-CM | POA: Diagnosis not present

## 2021-09-13 DIAGNOSIS — E118 Type 2 diabetes mellitus with unspecified complications: Secondary | ICD-10-CM | POA: Diagnosis not present

## 2021-09-13 DIAGNOSIS — I10 Essential (primary) hypertension: Secondary | ICD-10-CM | POA: Diagnosis not present

## 2021-09-13 IMAGING — US US RENAL
1 series · 14 of 25 positions shown · non-contrast
Comparison: None.

CLINICAL DATA: Acute kidney injury

EXAM:
RENAL / URINARY TRACT ULTRASOUND COMPLETE

[Series 1: us renal · 14 of 35 slices shown]
[im 1/35]
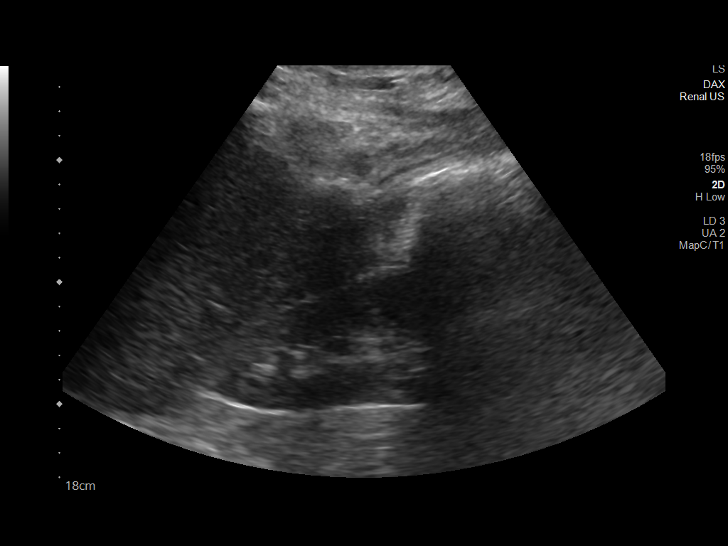
[im 3/35]
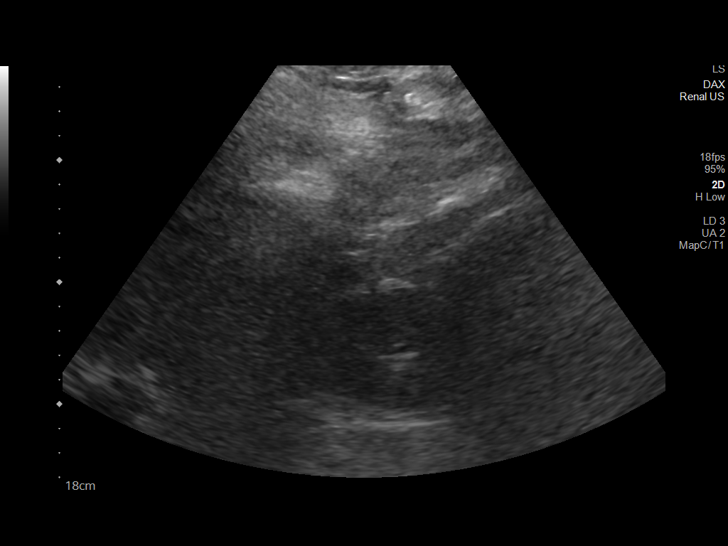
[im 6/35]
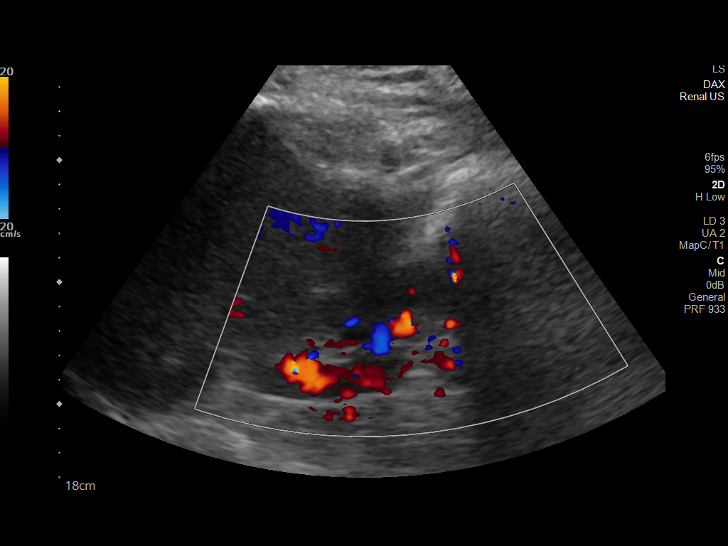
[im 9/35]
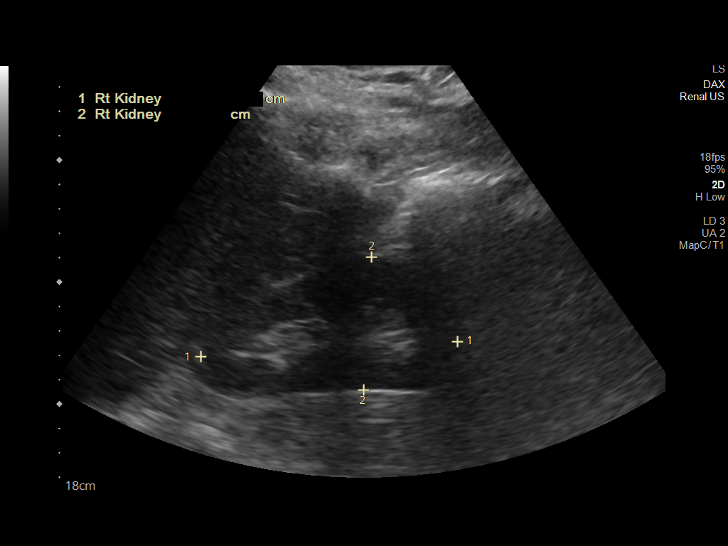
[im 12/35]
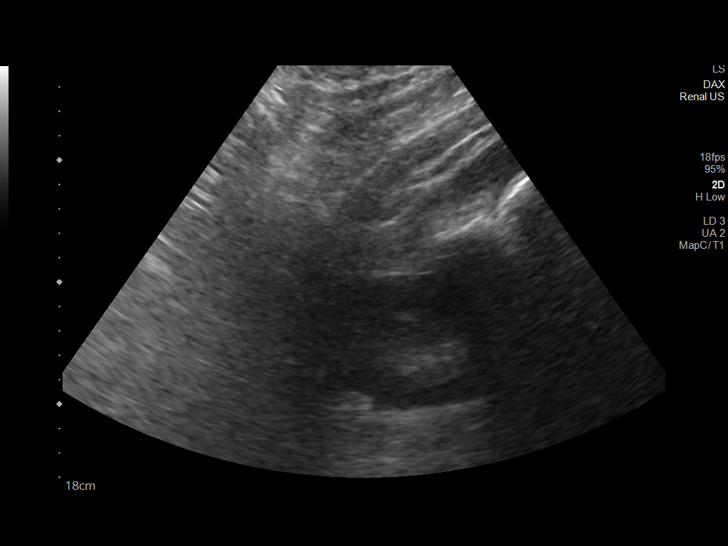
[im 13/35]
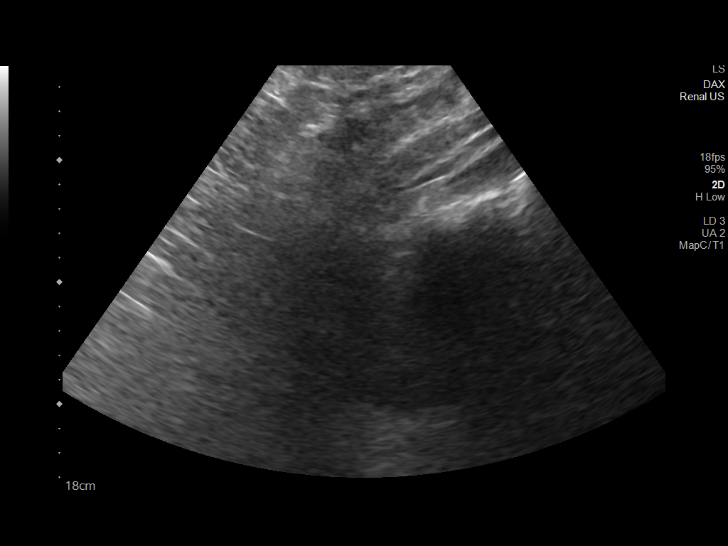
[im 16/35]
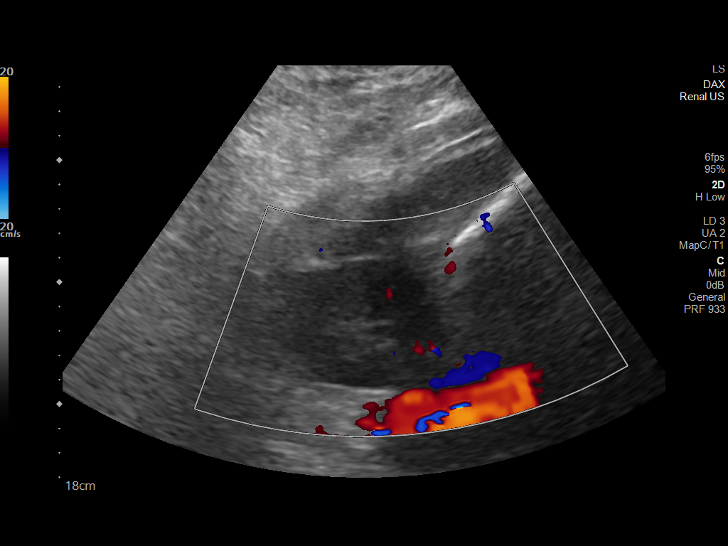
[im 19/35]
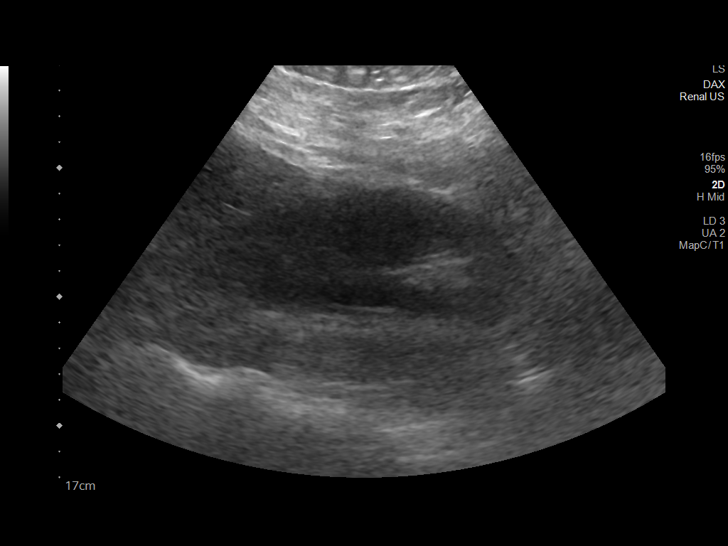
[im 22/35]
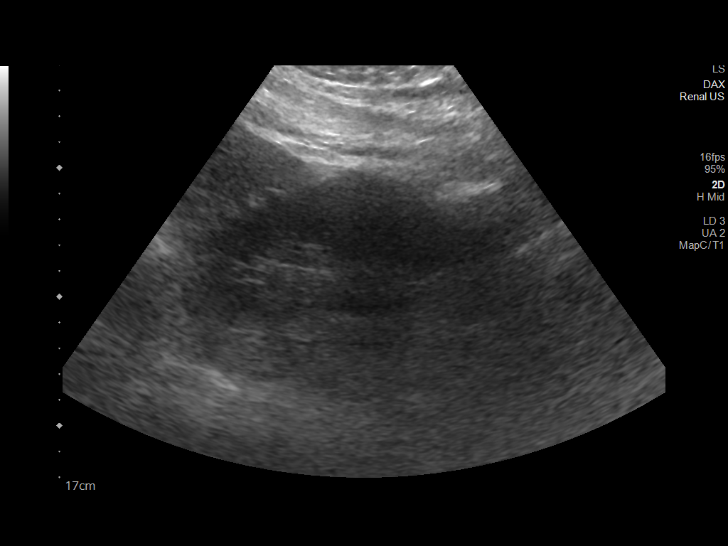
[im 23/35]
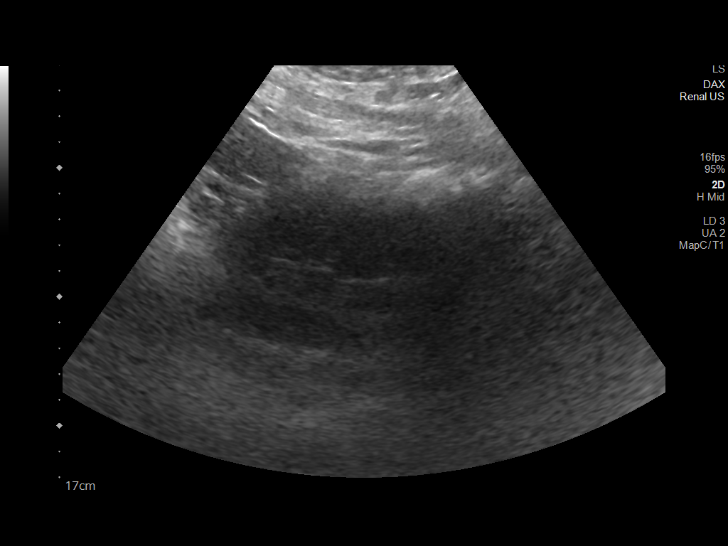
[im 26/35]
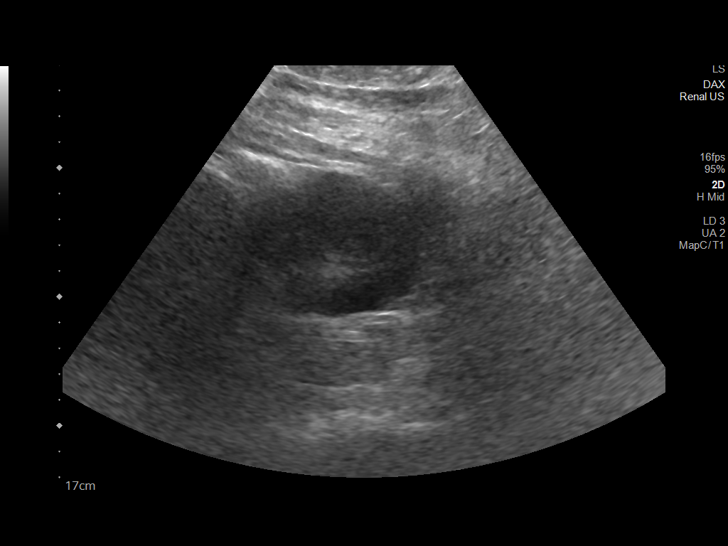
[im 29/35]
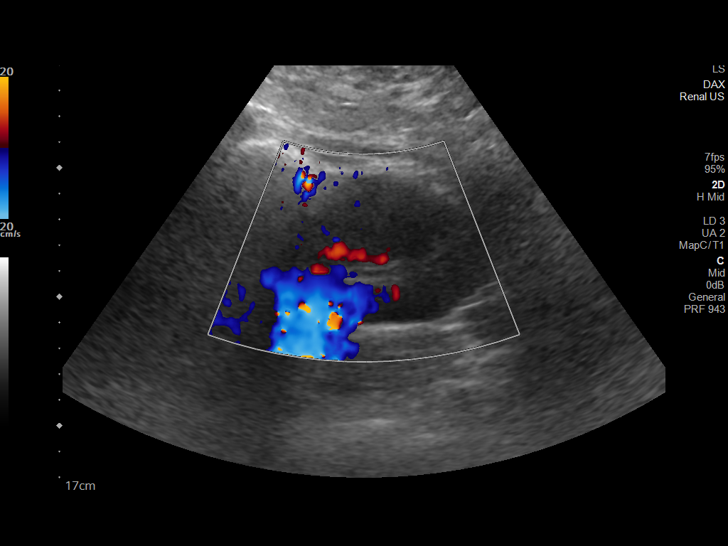
[im 32/35]
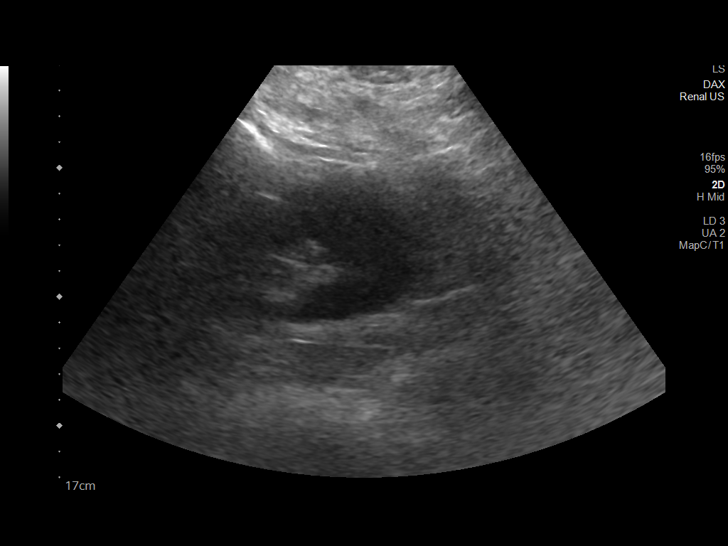
[im 35/35]
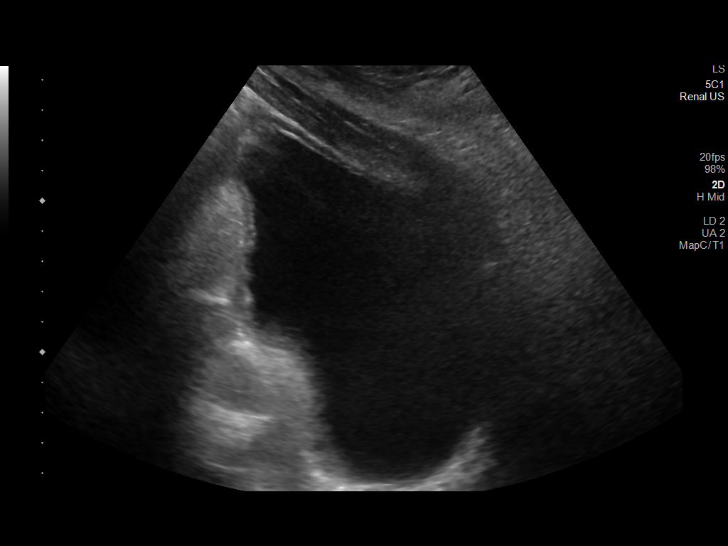

[14 of 25 positions shown; findings below may reference images not displayed]

FINDINGS: Right Kidney:

Renal measurements: 10.5 x 5.5 x 5.7 cm = volume: 173.0 mL.
Echogenicity and renal cortical thickness are within normal limits.
No mass, perinephric fluid, or hydronephrosis visualized. No
sonographically demonstrable calculus or ureterectasis.

Left Kidney:

Renal measurements: 12.6 x 5.5 x 5.2 cm = volume: 188.4 mL.
Echogenicity and renal cortical thickness are within normal limits.
No mass, perinephric fluid, or hydronephrosis visualized. No
sonographically demonstrable calculus or ureterectasis.

Bladder:

Urinary bladder appears somewhat distended. Appears normal for
degree of bladder distention.

Other:

None.
IMPRESSION: Urinary bladder appears somewhat distended. No lesion reparable to
the urinary bladder is seen on this study. Kidneys appear within
normal limits bilaterally.

## 2021-09-14 DIAGNOSIS — N1831 Chronic kidney disease, stage 3a: Secondary | ICD-10-CM | POA: Diagnosis not present

## 2021-09-14 DIAGNOSIS — M6281 Muscle weakness (generalized): Secondary | ICD-10-CM | POA: Diagnosis not present

## 2021-09-14 DIAGNOSIS — Z89512 Acquired absence of left leg below knee: Secondary | ICD-10-CM | POA: Diagnosis not present

## 2021-09-14 DIAGNOSIS — I10 Essential (primary) hypertension: Secondary | ICD-10-CM | POA: Diagnosis not present

## 2021-09-14 DIAGNOSIS — K5981 Ogilvie syndrome: Secondary | ICD-10-CM | POA: Diagnosis not present

## 2021-09-14 IMAGING — CT CT ABD-PELV W/ CM
2 of 5 series · 16 of 46 positions shown, 18 images · IV contrast (APPLIED)
Comparison: CT of the chest abdomen pelvis dated 09/28/2017.

CLINICAL DATA: [age] year male with abdominal distension.
Concern for bowel obstruction.

EXAM:
CT ABDOMEN AND PELVIS WITH CONTRAST
TECHNIQUE: Multidetector CT imaging of the abdomen and pelvis was performed
using the standard protocol following bolus administration of
intravenous contrast.
CONTRAST:  100mL OMNIPAQUE IOHEXOL 300 MG/ML  SOLN

[Series 3: abd/ pelvis 5.0 i30f 2 · axial · 0.98mm/px · z∈[+862,+1302]mm · 13 of 100 slices shown, 15 images]
[im 6/100  soft-tissue]
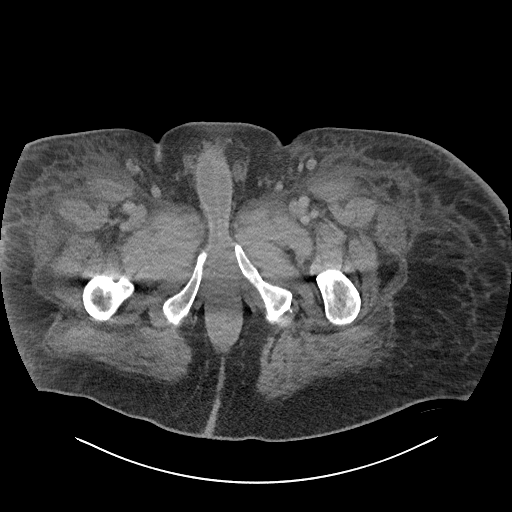
[im 6/100  bone]
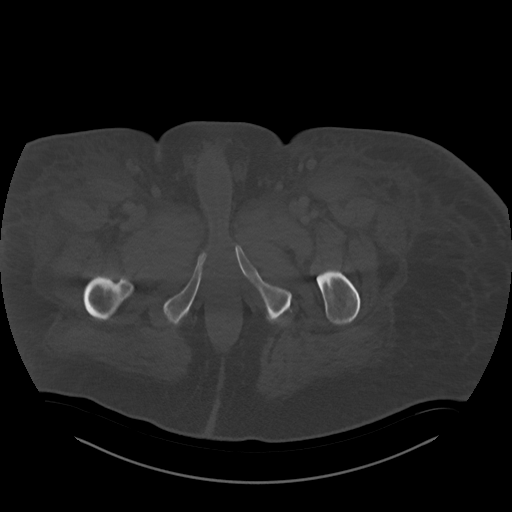
[im 16/100  soft-tissue]
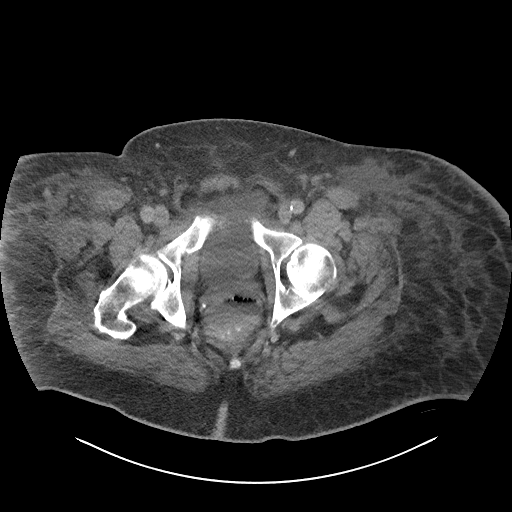
[im 21/100  soft-tissue]
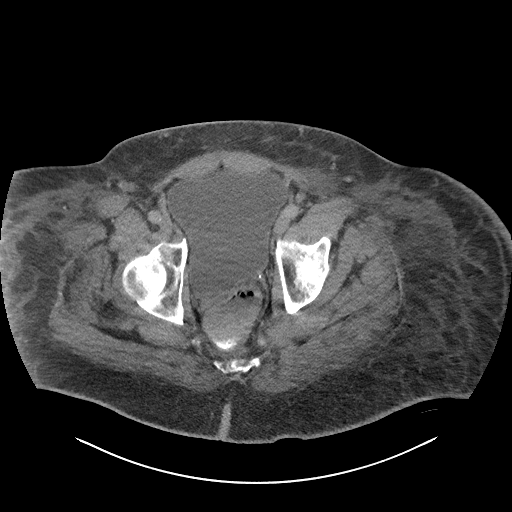
[im 27/100  soft-tissue]
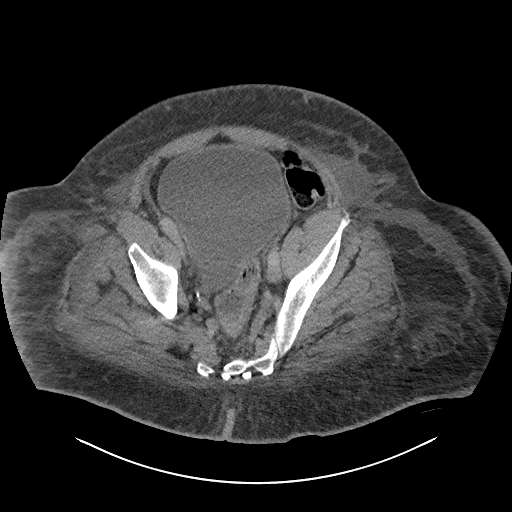
[im 37/100  soft-tissue]
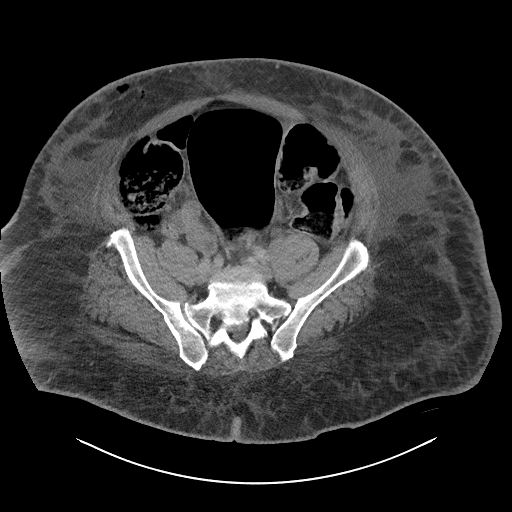
[im 42/100  soft-tissue]
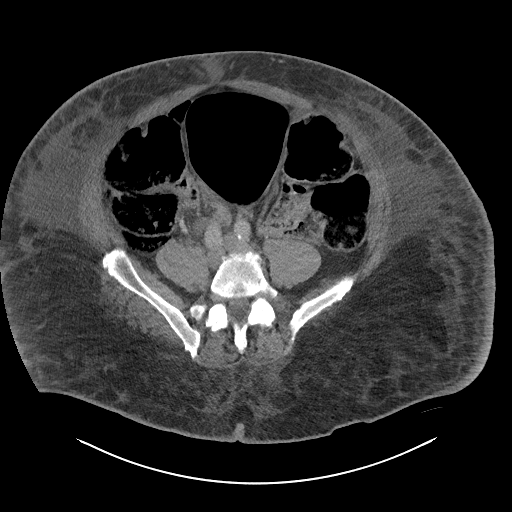
[im 53/100  soft-tissue]
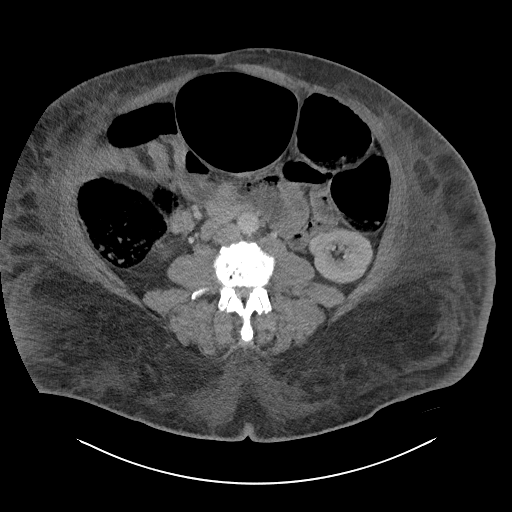
[im 58/100  soft-tissue]
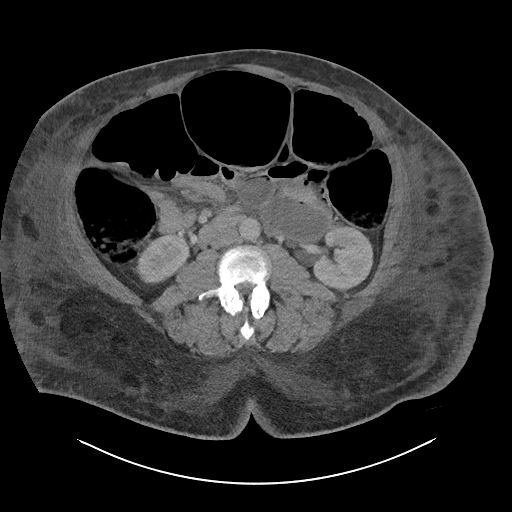
[im 63/100  soft-tissue]
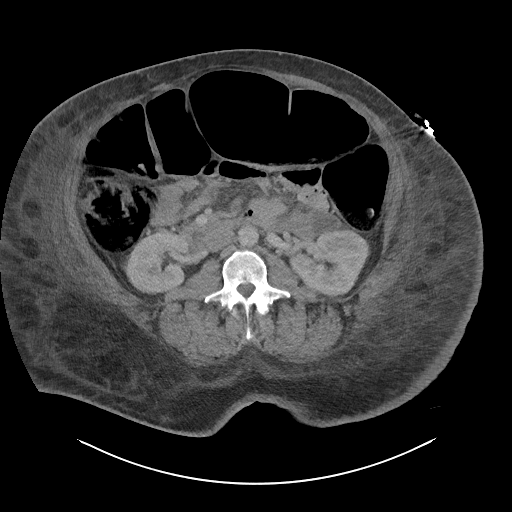
[im 63/100  bone]
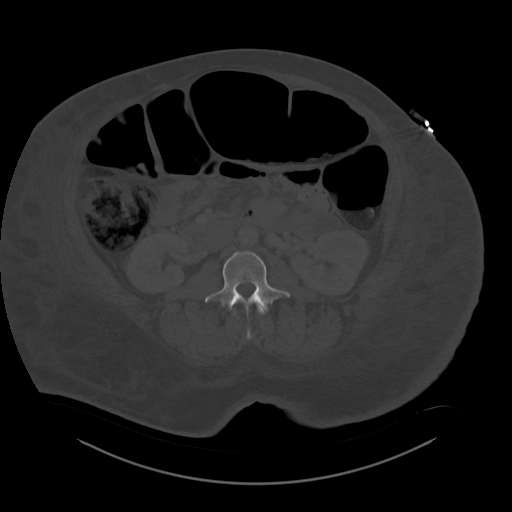
[im 73/100  soft-tissue]
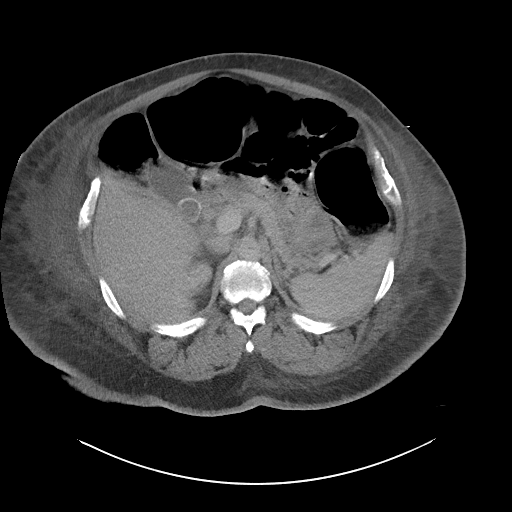
[im 79/100  soft-tissue]
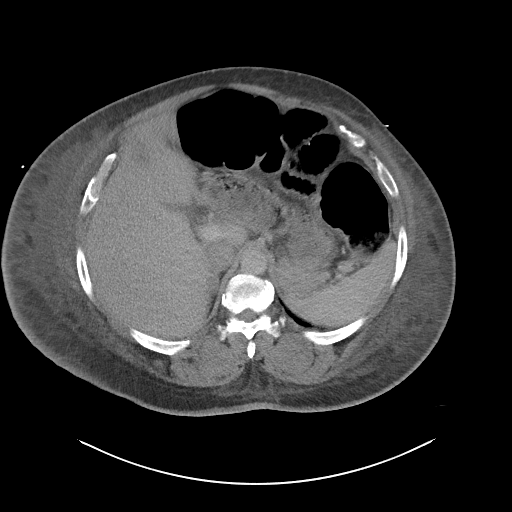
[im 84/100  soft-tissue]
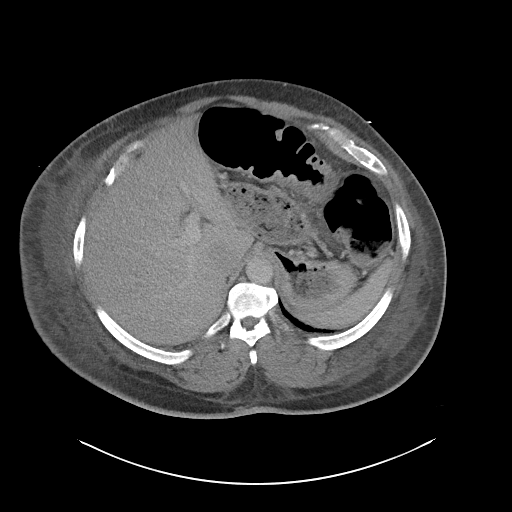
[im 94/100  soft-tissue]
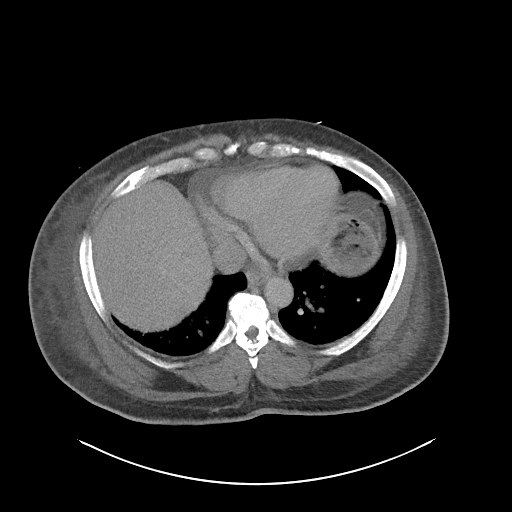

[Series 6: coronal soft tissue · coronal · 0.95mm/px · 3 of 137 slices shown]
[im 46/137  soft-tissue]
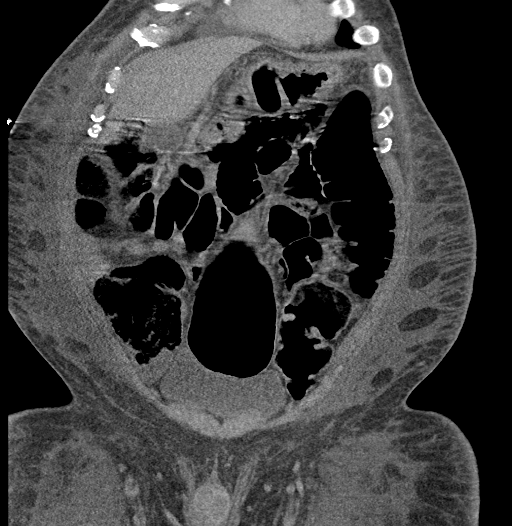
[im 61/137  soft-tissue]
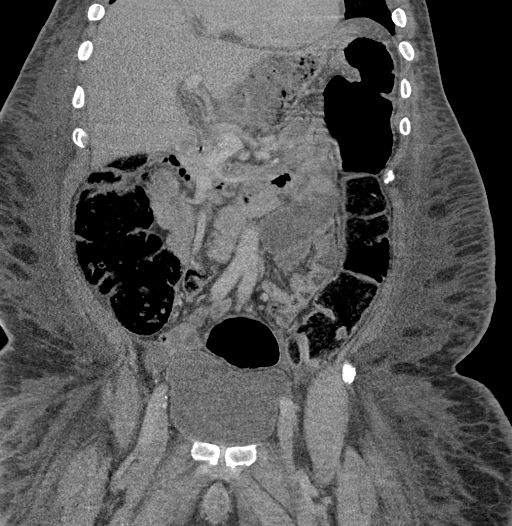
[im 76/137  soft-tissue]
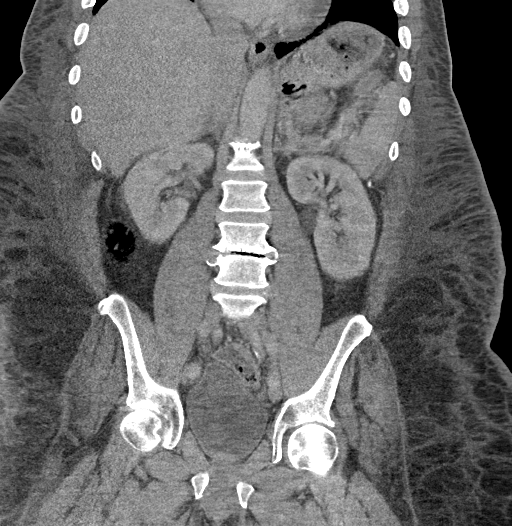

[16 of 46 positions shown; findings below may reference images not displayed]

FINDINGS: Evaluation of this exam is limited due to respiratory motion
artifact.

Lower chest: Partially visualized small pericardial effusion. There
is mild dilatation of the visualized pulmonary vasculature, likely
mild congestion.

No intra-abdominal free air or free fluid.

Hepatobiliary: Probable mild fatty liver. No intrahepatic biliary
duct dilatation. There is a 15 mm stone in the neck of the
gallbladder. No pericholecystic fluid or evidence of acute
cholecystitis by CT. Ultrasound may provide better evaluation if
there is high clinical concern for acute cholecystitis.

Pancreas: There is moderate atrophy of the pancreas. No active
inflammatory changes. No dilatation of the main pancreatic duct.

Spleen: Normal in size without focal abnormality.

Adrenals/Urinary Tract: The adrenal glands unremarkable. There is no
hydronephrosis on either side. There is symmetric enhancement and
excretion of contrast by both kidneys. The visualized ureters and
urinary bladder appear unremarkable.

Stomach/Bowel: There is air distended colon without evidence of
obstruction or twisting. There is segmental thickening of the distal
transverse colon ([DATE]) which may be related to underdistention.
Mild focal colitis is less likely. Clinical correlation is
recommended. There is apparent soft tissue thickening with possible
shouldering within the lumen of this segment of the colon concerning
for a mass. Further evaluation with colonoscopy after resolution of
acute symptoms recommended. There is no bowel obstruction. The
appendix is normal.

Vascular/Lymphatic: The abdominal aorta and IVC are unremarkable. No
portal venous gas. There is no adenopathy.

Reproductive: The prostate and seminal vesicles are grossly
unremarkable as visualized.

Other: There is diffuse subcutaneous edema and anasarca.

Musculoskeletal: Degenerative changes of spine. No acute osseous
pathology.
IMPRESSION: 1. Air distended colon without evidence of obstruction or twisting.
Normal appendix.
2. Apparent soft tissue thickening of the distal transverse colon
concerning for a mass. Further evaluation with colonoscopy after
resolution of acute symptoms recommended.
3. Cholelithiasis.
4. Small pericardial effusion and findings of mild pulmonary
congestion.
5. Diffuse subcutaneous edema and anasarca.

## 2021-09-14 IMAGING — DX DG ABD PORTABLE 1V
1 series · 1 of 1 positions shown · non-contrast
Comparison: CT Abdomen and Pelvis 09/28/2017.

CLINICAL DATA: 59 year old male with abdominal distension.

EXAM:
PORTABLE ABDOMEN - 1 VIEW

[abdomen kub]
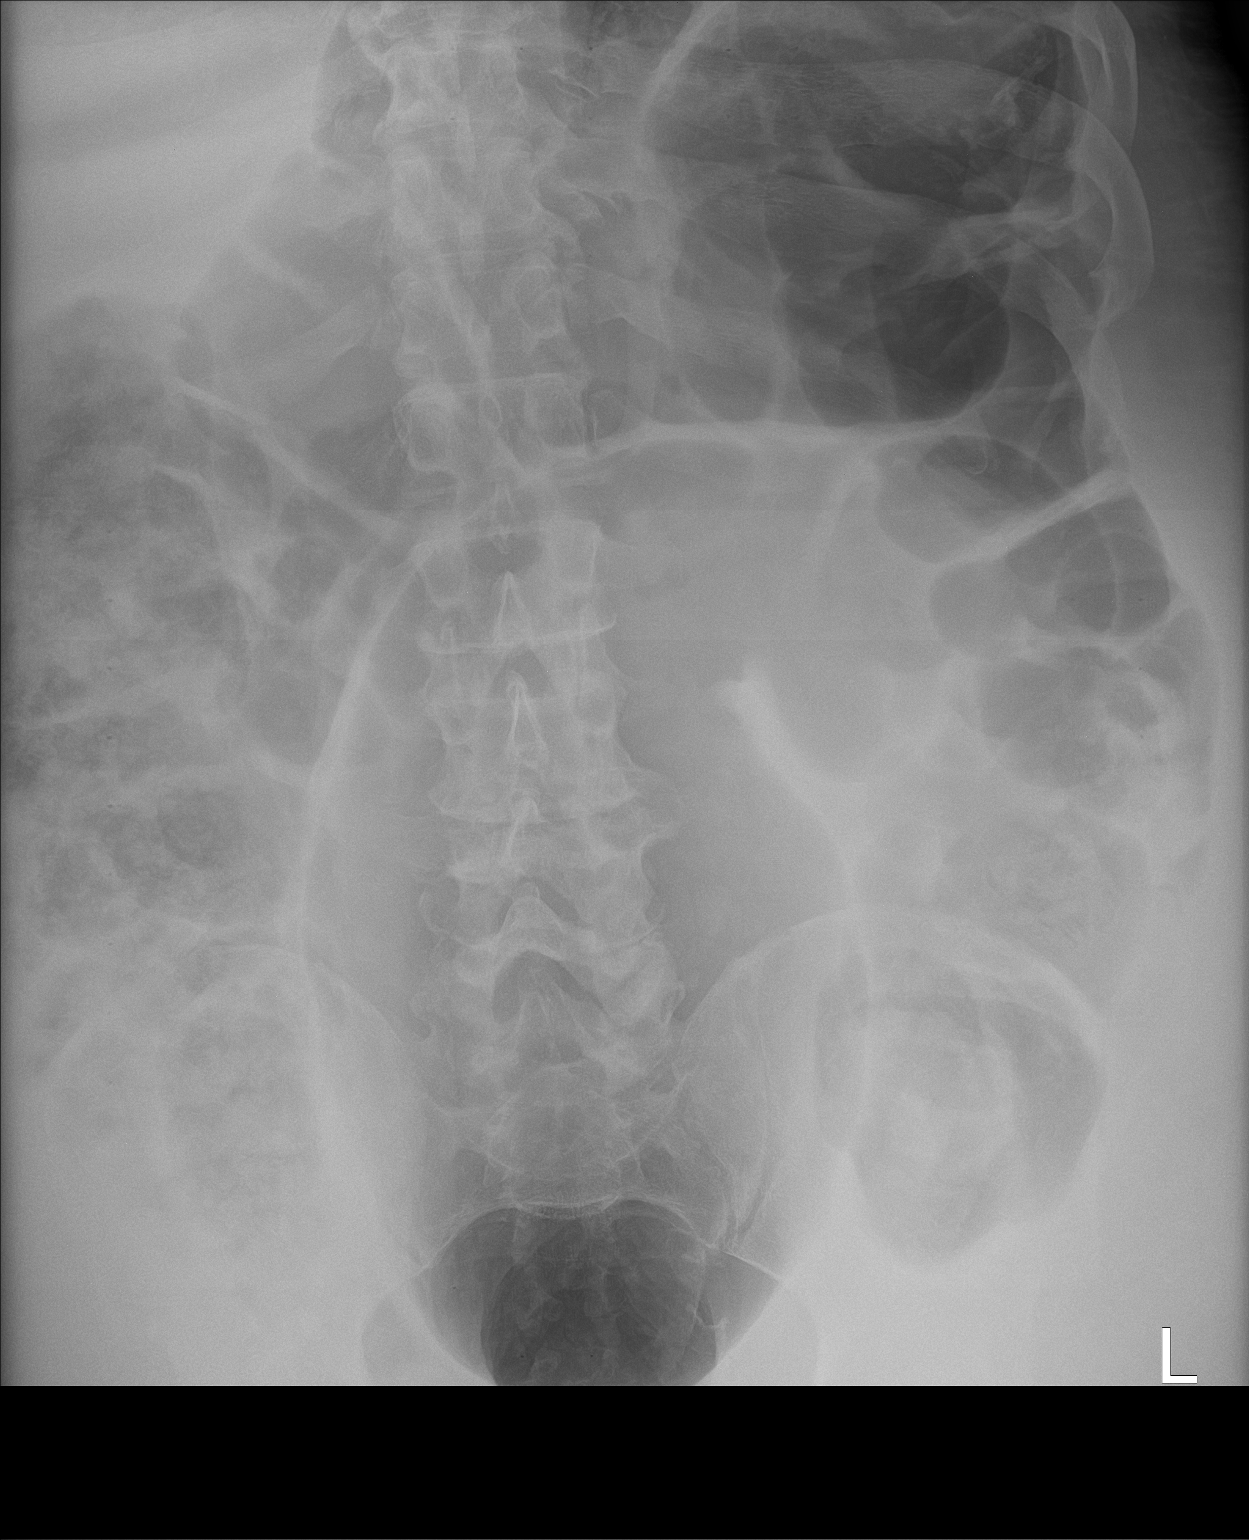

[1 of 1 positions shown; findings below may reference images not displayed]

FINDINGS: Portable AP supine view at 3738 hours. Abnormally gas distended
sigmoid colon projecting in the central lower abdomen and upper
pelvis is dilated to 15 mm, with upstream gas dilatation of the
large bowel. The configuration is suspicious for developing sigmoid
volvulus.

Superimposed retained stool in the large bowel. No dilated small
bowel loops. No pneumoperitoneum is evident on this supine view. No
acute osseous abnormality identified.
IMPRESSION: Dilated sigmoid colon with upstream gas-filled large bowel
suspicious for Sigmoid Volvulus.
CT Abdomen and Pelvis (oral and IV contrast preferred) should be
confirmatory.

These results will be called to the ordering clinician or
representative by the Radiologist Assistant, and communication
documented in the PACS or [REDACTED].

## 2021-09-18 IMAGING — US US RENAL
1 series · 14 of 24 positions shown · non-contrast
Comparison: Renal ultrasound January 04, 2020; CT abdomen and
pelvis January 05, 2020

CLINICAL DATA: Recent acute kidney injury with abdominal pain

EXAM:
RENAL / URINARY TRACT ULTRASOUND COMPLETE

[Series 1: us renal · 14 of 24 slices shown]
[im 1/24]
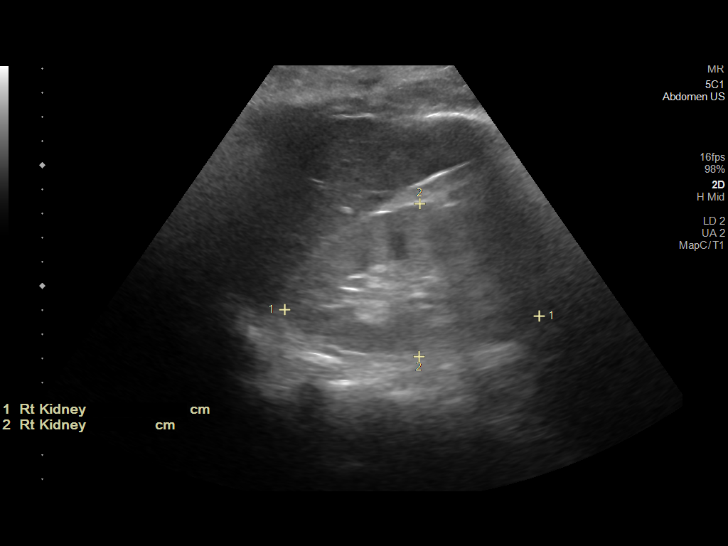
[im 3/24]
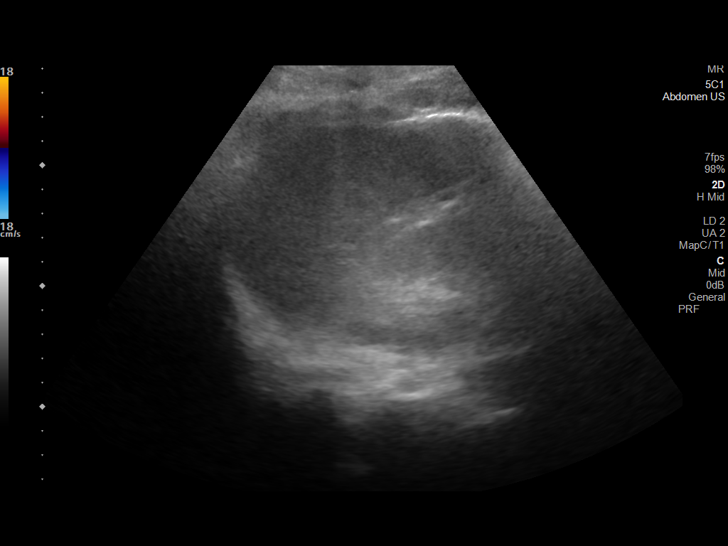
[im 5/24]
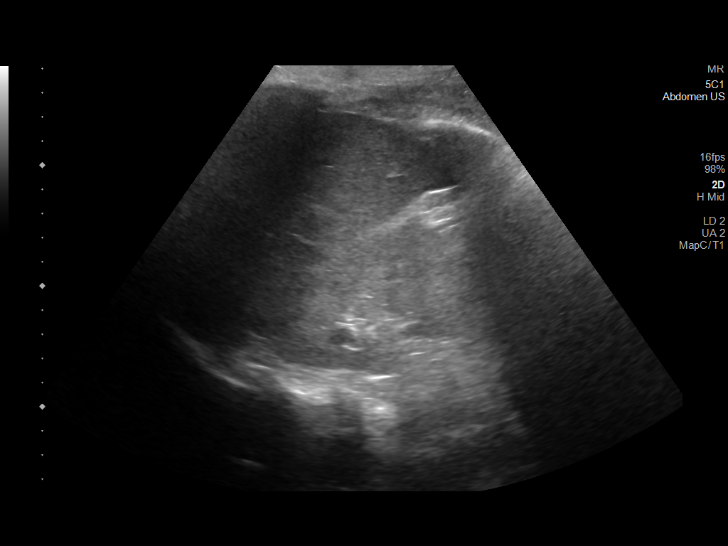
[im 7/24]
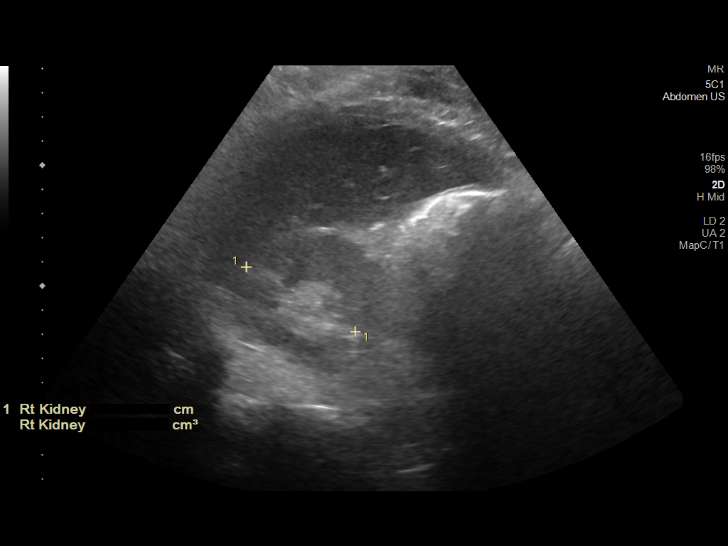
[im 8/24]
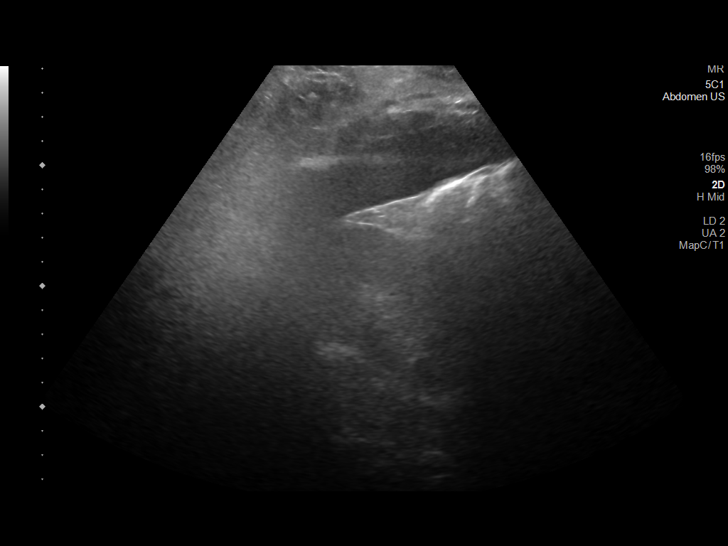
[im 10/24]
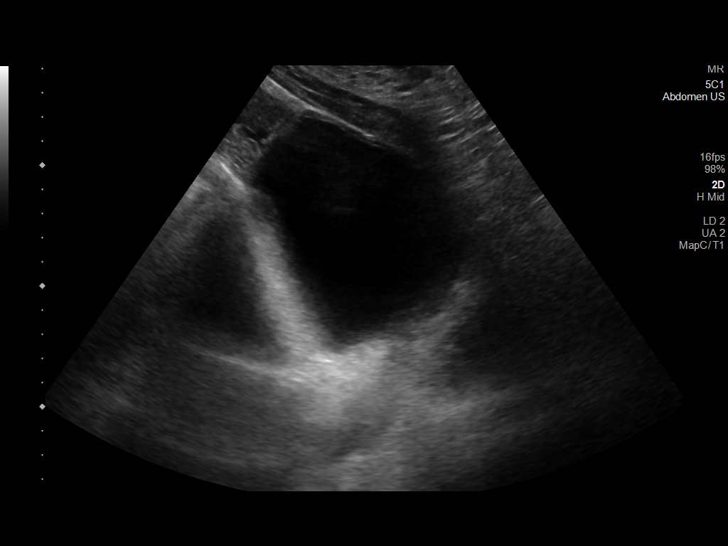
[im 12/24]
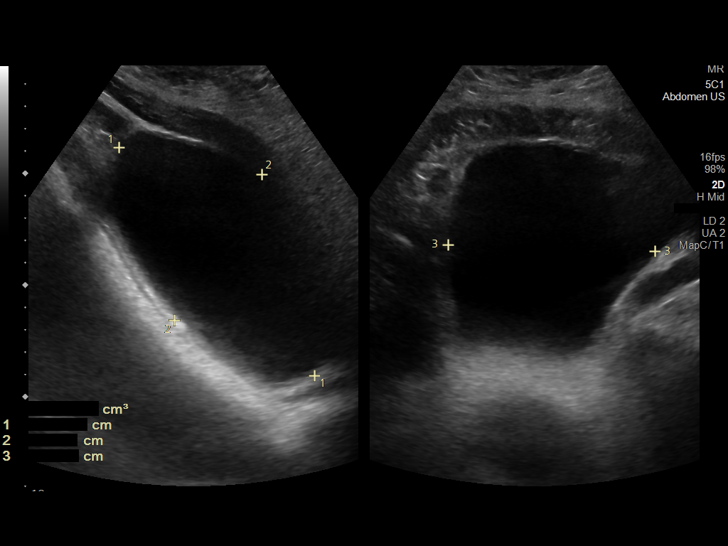
[im 13/24]
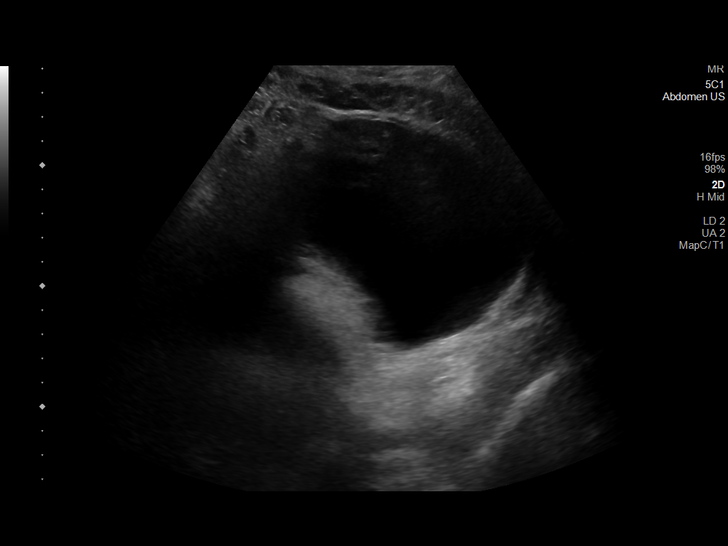
[im 15/24]
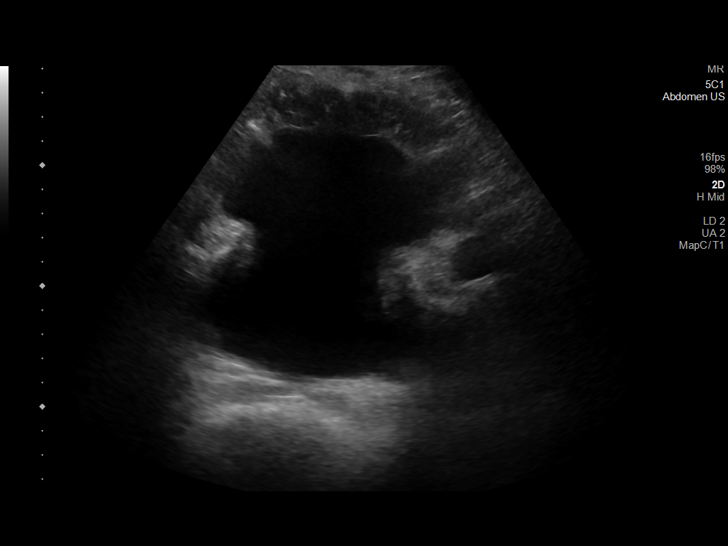
[im 17/24]
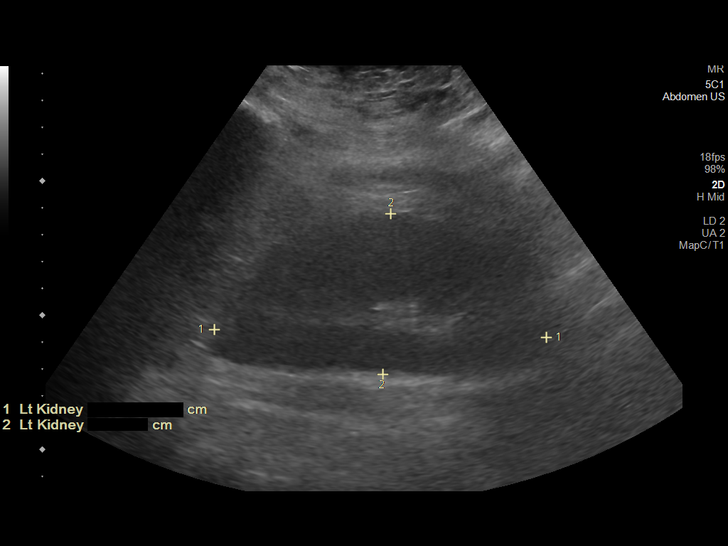
[im 19/24]
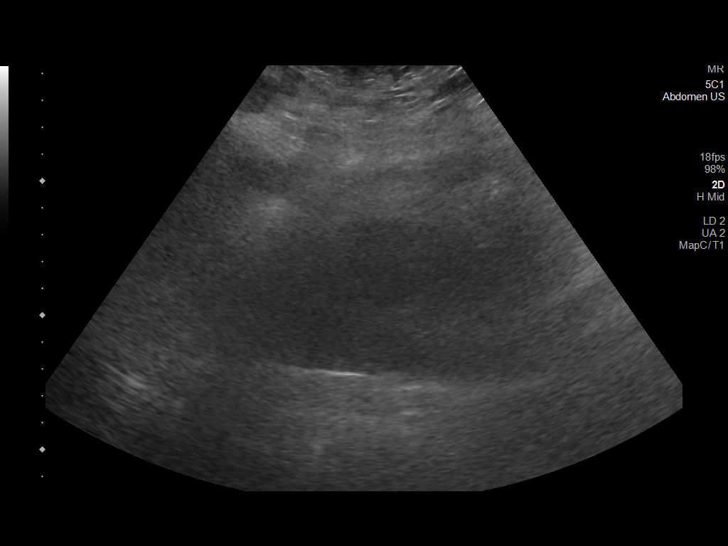
[im 20/24]
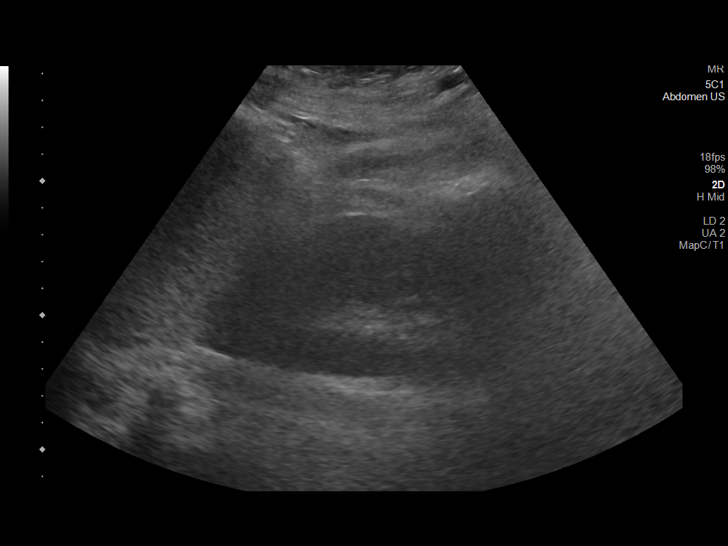
[im 22/24]
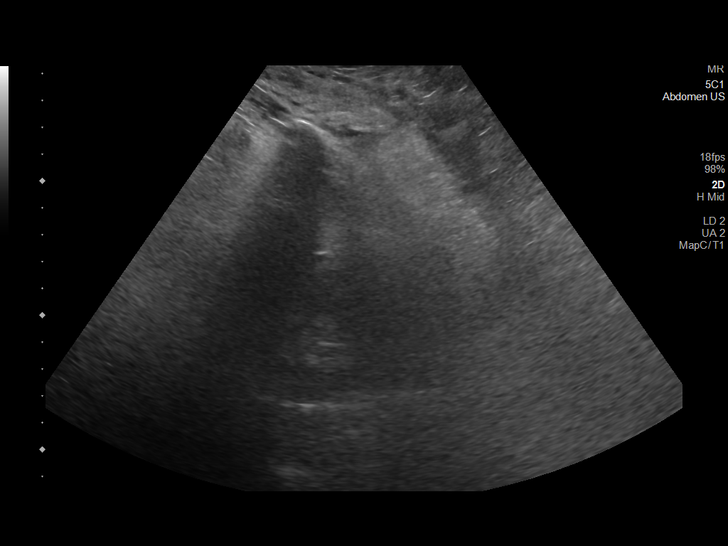
[im 24/24]
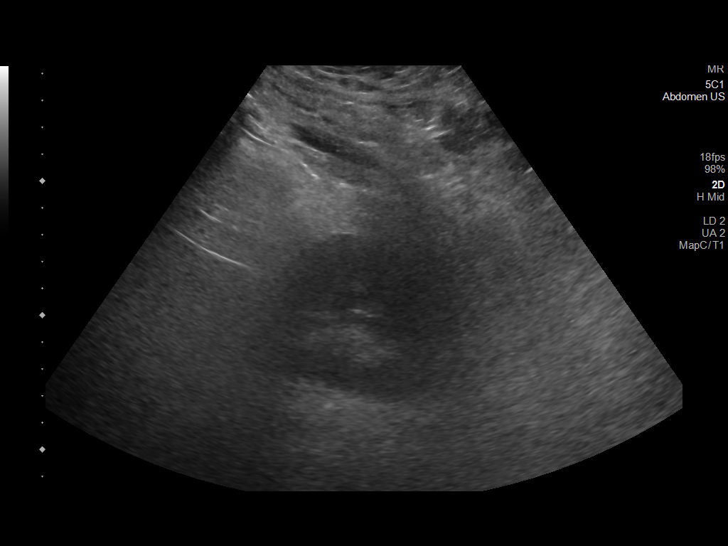

[14 of 24 positions shown; findings below may reference images not displayed]

FINDINGS: Right Kidney:

Renal measurements: 10.6 x 6.3 x 5.2 cm = volume: 183 mL.
Echogenicity and renal cortical thickness are within normal limits.
No mass, perinephric fluid, or hydronephrosis visualized. No
sonographically demonstrable calculus or ureterectasis.

Left Kidney:

Renal measurements: 12.4 x 6.0 x 5.3 cm = volume: 207 mL.
Echogenicity and renal cortical thickness are within normal limits.
No mass, perinephric fluid, or hydronephrosis visualized. No
sonographically demonstrable calculus or ureterectasis.

Bladder:

Urinary bladder again appears somewhat distended with a measured
urinary bladder volume of 497 mL. Urinary bladder wall thickness
appears normal.

Other:

None.
IMPRESSION: Persistent relative distension of the urinary bladder, raising
concern for a degree of bladder outlet obstruction. Study otherwise
unremarkable.

## 2021-09-18 IMAGING — DX DG ABDOMEN 1V
4 series · 4 of 4 positions shown · non-contrast
Comparison: January 05, 2020 CT abdomen and pelvis and abdominal
radiograph

CLINICAL DATA: Abdominal distension and pain

EXAM:
ABDOMEN - 1 VIEW

[abdomen kub (1 of 4)]
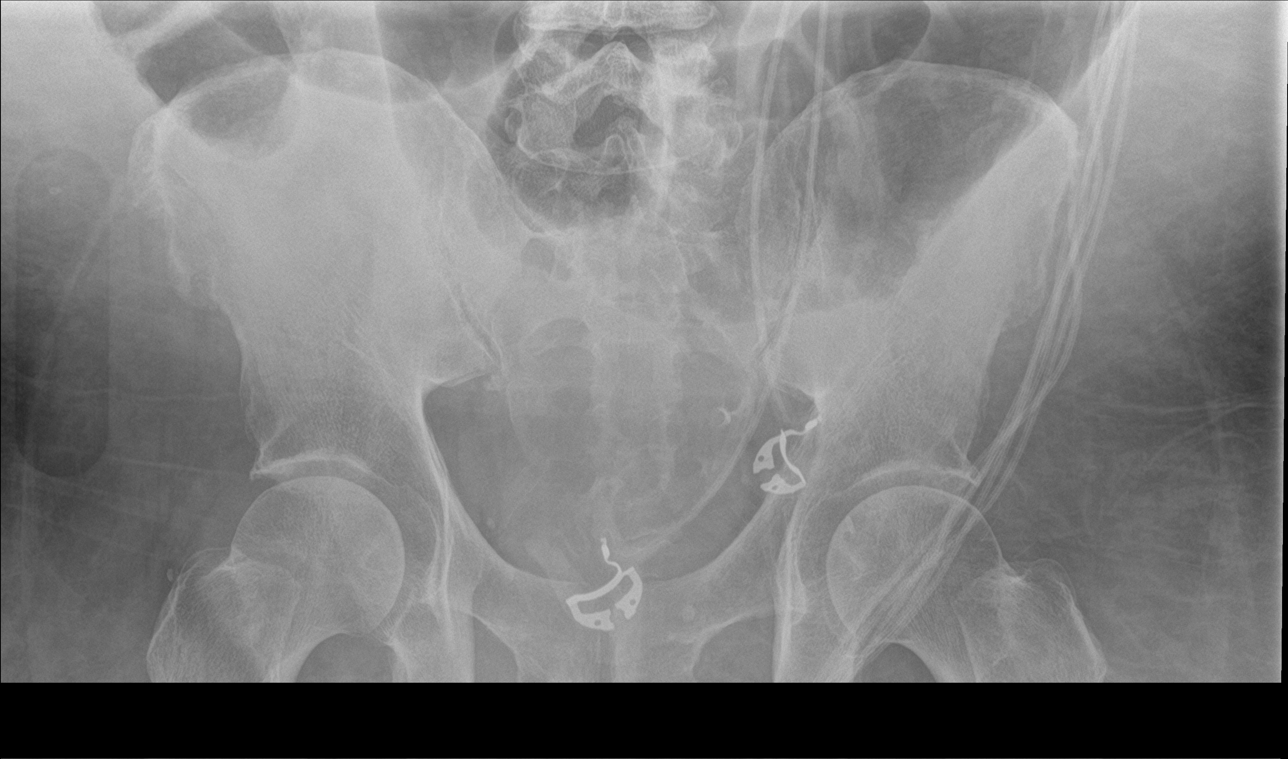

[abdomen kub (2 of 4)]
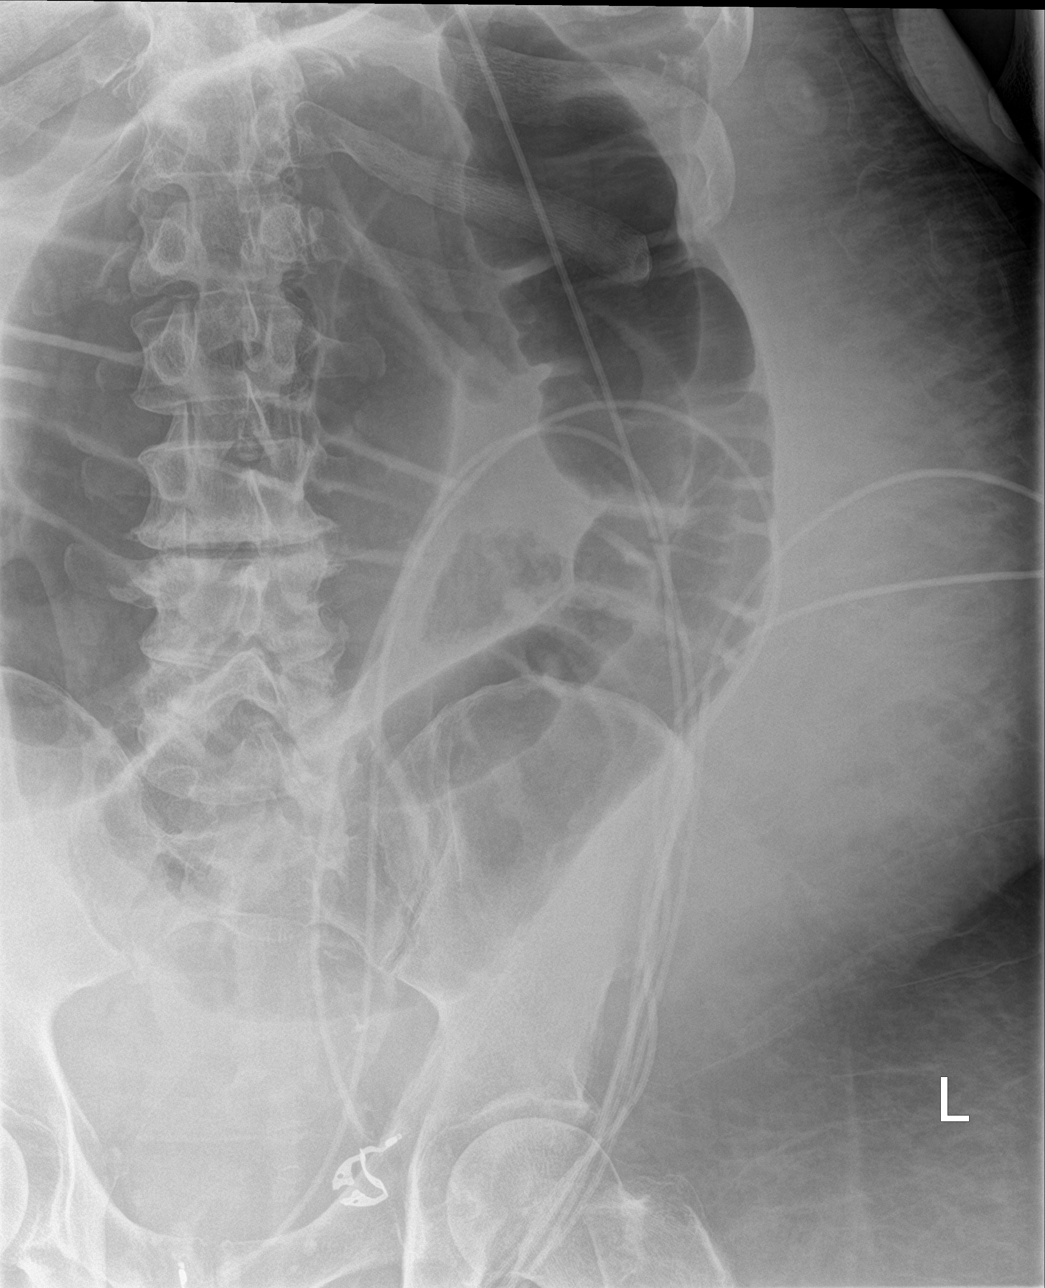

[abdomen kub (3 of 4)]
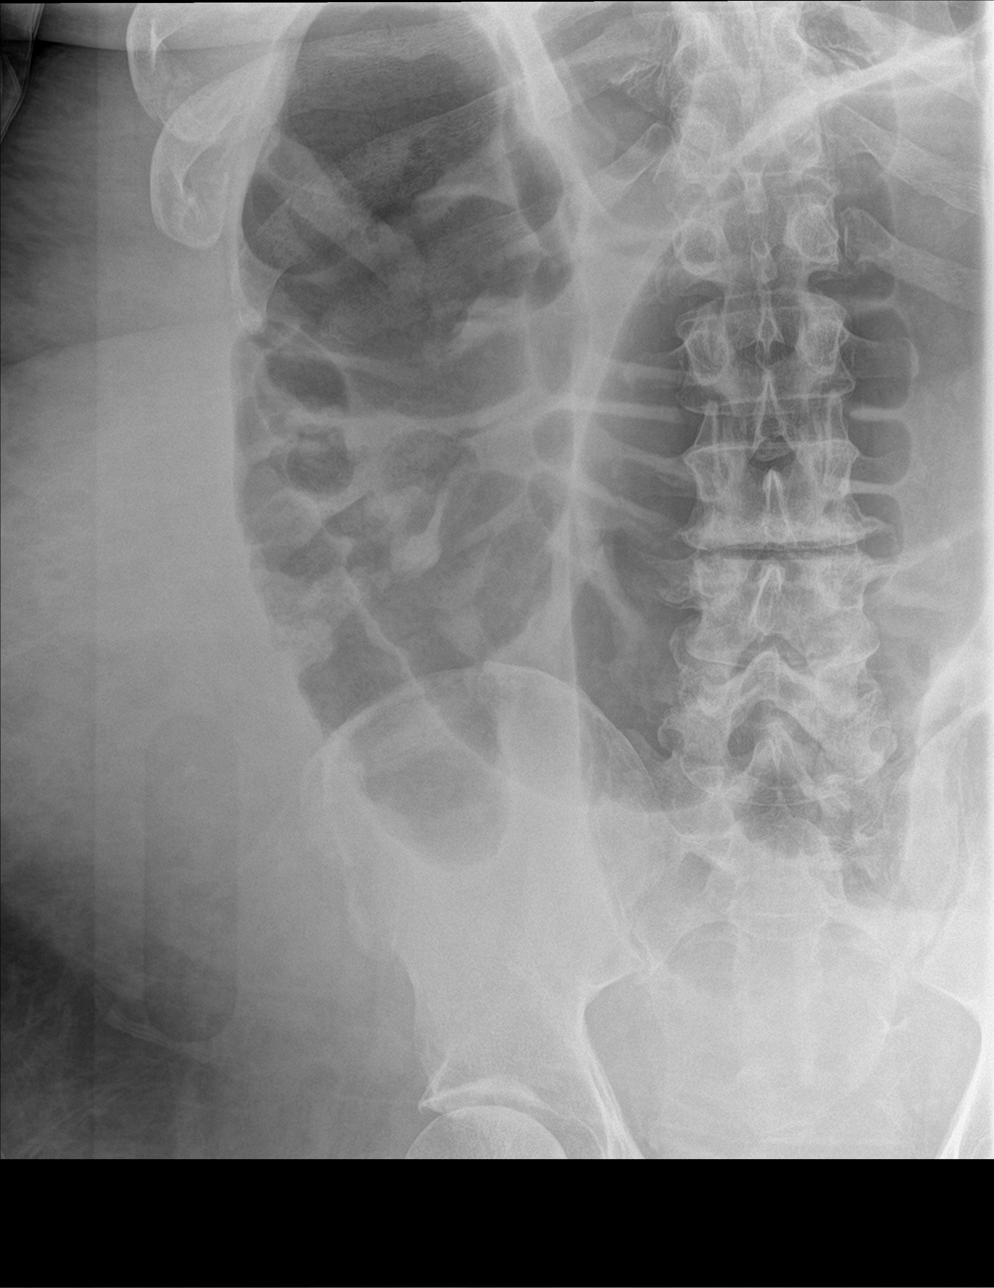

[abdomen kub (4 of 4)]
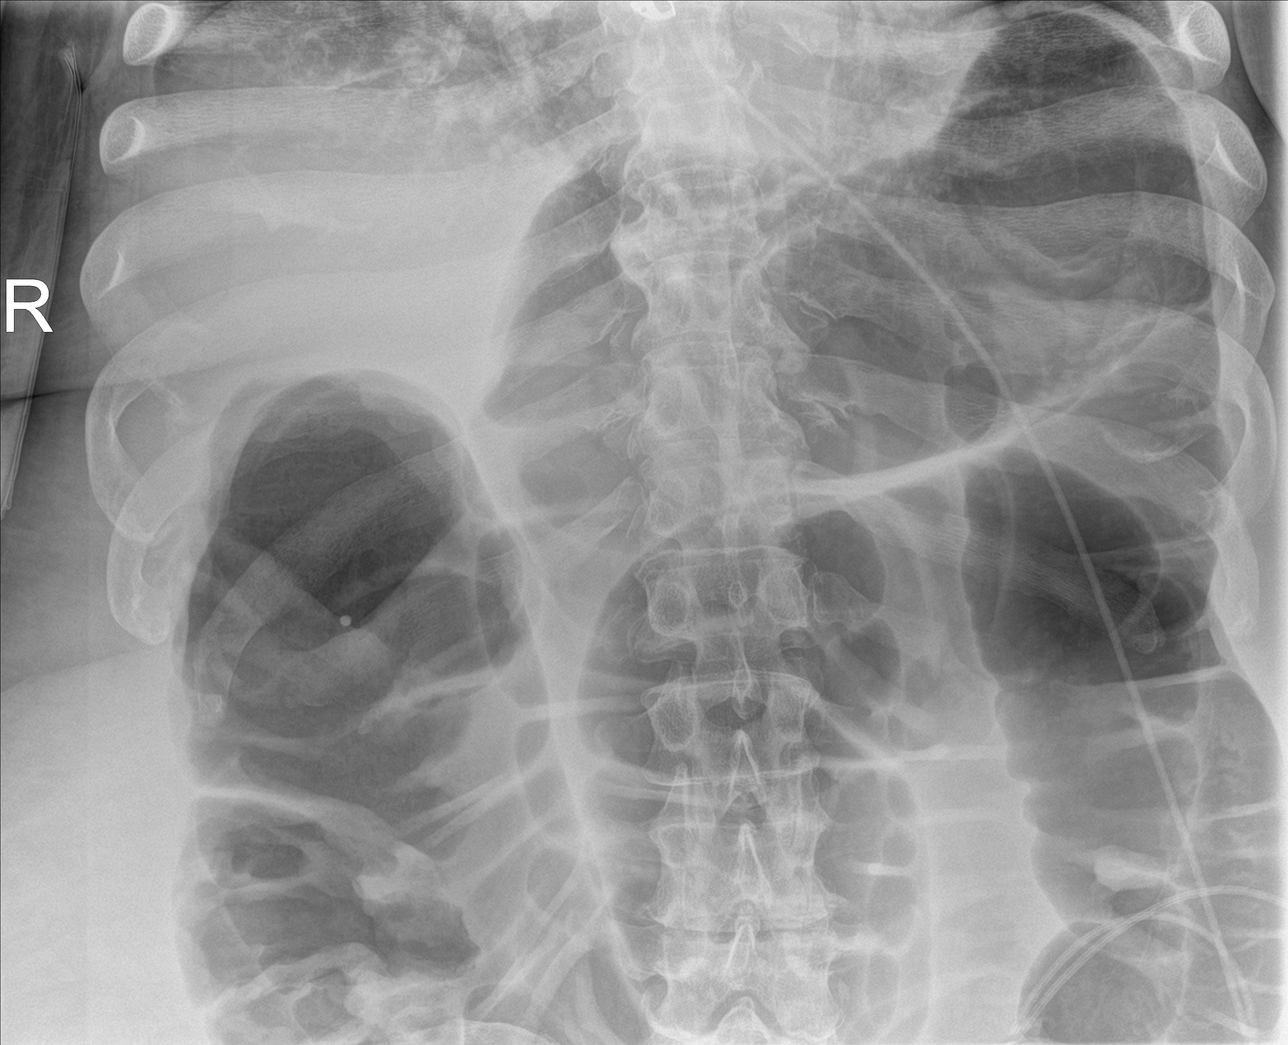

[4 of 4 positions shown; findings below may reference images not displayed]

FINDINGS: There is colonic dilatation throughout much of the colon. No
appreciable small bowel dilatation. No air-fluid levels. No free air
evident on supine examination. Small pelvic calcifications most
likely represent phleboliths.
IMPRESSION: Suspect colonic ileus. Distal colonic obstruction potentially could
present in this manner. No distal colonic obstruction is seen on
recent CT. No appreciable small bowel dilatation. No air-fluid
levels. No evident free air on supine examination

## 2021-09-19 DIAGNOSIS — N1831 Chronic kidney disease, stage 3a: Secondary | ICD-10-CM | POA: Diagnosis not present

## 2021-09-19 DIAGNOSIS — I10 Essential (primary) hypertension: Secondary | ICD-10-CM | POA: Diagnosis not present

## 2021-09-19 DIAGNOSIS — K5981 Ogilvie syndrome: Secondary | ICD-10-CM | POA: Diagnosis not present

## 2021-09-19 DIAGNOSIS — Z89512 Acquired absence of left leg below knee: Secondary | ICD-10-CM | POA: Diagnosis not present

## 2021-09-19 DIAGNOSIS — M6281 Muscle weakness (generalized): Secondary | ICD-10-CM | POA: Diagnosis not present

## 2021-09-20 DIAGNOSIS — F321 Major depressive disorder, single episode, moderate: Secondary | ICD-10-CM | POA: Diagnosis not present

## 2021-09-20 DIAGNOSIS — R69 Illness, unspecified: Secondary | ICD-10-CM | POA: Diagnosis not present

## 2021-09-21 DIAGNOSIS — K5981 Ogilvie syndrome: Secondary | ICD-10-CM | POA: Diagnosis not present

## 2021-09-21 DIAGNOSIS — I10 Essential (primary) hypertension: Secondary | ICD-10-CM | POA: Diagnosis not present

## 2021-09-21 DIAGNOSIS — M6281 Muscle weakness (generalized): Secondary | ICD-10-CM | POA: Diagnosis not present

## 2021-09-21 DIAGNOSIS — N1831 Chronic kidney disease, stage 3a: Secondary | ICD-10-CM | POA: Diagnosis not present

## 2021-09-21 DIAGNOSIS — Z89512 Acquired absence of left leg below knee: Secondary | ICD-10-CM | POA: Diagnosis not present

## 2021-09-24 DIAGNOSIS — R1310 Dysphagia, unspecified: Secondary | ICD-10-CM | POA: Diagnosis not present

## 2021-09-24 DIAGNOSIS — I10 Essential (primary) hypertension: Secondary | ICD-10-CM | POA: Diagnosis not present

## 2021-09-24 DIAGNOSIS — R0989 Other specified symptoms and signs involving the circulatory and respiratory systems: Secondary | ICD-10-CM | POA: Diagnosis not present

## 2021-09-24 DIAGNOSIS — N1831 Chronic kidney disease, stage 3a: Secondary | ICD-10-CM | POA: Diagnosis not present

## 2021-09-24 DIAGNOSIS — R0600 Dyspnea, unspecified: Secondary | ICD-10-CM | POA: Diagnosis not present

## 2021-09-24 DIAGNOSIS — M6281 Muscle weakness (generalized): Secondary | ICD-10-CM | POA: Diagnosis not present

## 2021-09-24 DIAGNOSIS — K5981 Ogilvie syndrome: Secondary | ICD-10-CM | POA: Diagnosis not present

## 2021-09-24 DIAGNOSIS — Z89512 Acquired absence of left leg below knee: Secondary | ICD-10-CM | POA: Diagnosis not present

## 2021-09-25 DIAGNOSIS — K5981 Ogilvie syndrome: Secondary | ICD-10-CM | POA: Diagnosis not present

## 2021-09-25 DIAGNOSIS — N1831 Chronic kidney disease, stage 3a: Secondary | ICD-10-CM | POA: Diagnosis not present

## 2021-09-25 DIAGNOSIS — R0989 Other specified symptoms and signs involving the circulatory and respiratory systems: Secondary | ICD-10-CM | POA: Diagnosis not present

## 2021-09-25 DIAGNOSIS — M6281 Muscle weakness (generalized): Secondary | ICD-10-CM | POA: Diagnosis not present

## 2021-09-25 DIAGNOSIS — I10 Essential (primary) hypertension: Secondary | ICD-10-CM | POA: Diagnosis not present

## 2021-09-25 DIAGNOSIS — Z89512 Acquired absence of left leg below knee: Secondary | ICD-10-CM | POA: Diagnosis not present

## 2021-09-25 IMAGING — CT CT ABD-PELV W/ CM
2 of 5 series · 16 of 46 positions shown, 18 images · IV contrast (Omni 300)
Comparison: 01/05/2020

CLINICAL DATA: Suspect bowel obstruction. Abdominal pain and
distension.

EXAM:
CT ABDOMEN AND PELVIS WITH CONTRAST
TECHNIQUE: Multidetector CT imaging of the abdomen and pelvis was performed
using the standard protocol following bolus administration of
intravenous contrast. Unable to move patient completely into scanner
to cover the very superior aspect of the liver due to patient's
severe claustrophobia.
CONTRAST:  100mL OMNIPAQUE IOHEXOL 300 MG/ML  SOLN

[Series 3: a/p w/ 5mm · axial · 0.98mm/px · z∈[+853,+1293]mm · 13 of 100 slices shown, 15 images]
[im 6/100  soft-tissue]
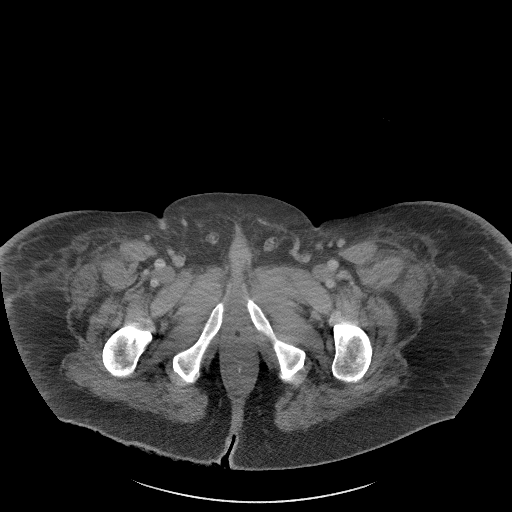
[im 6/100  bone]
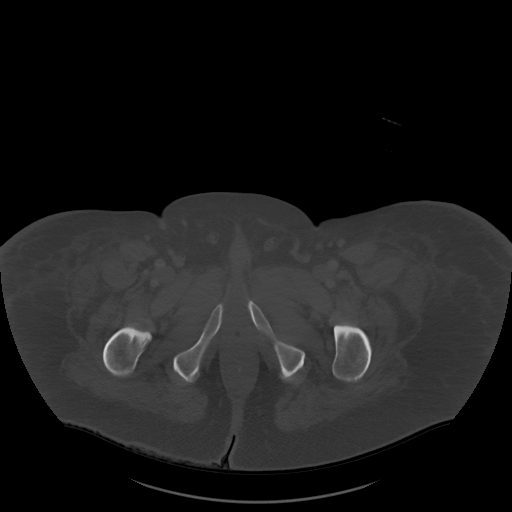
[im 12/100  soft-tissue]
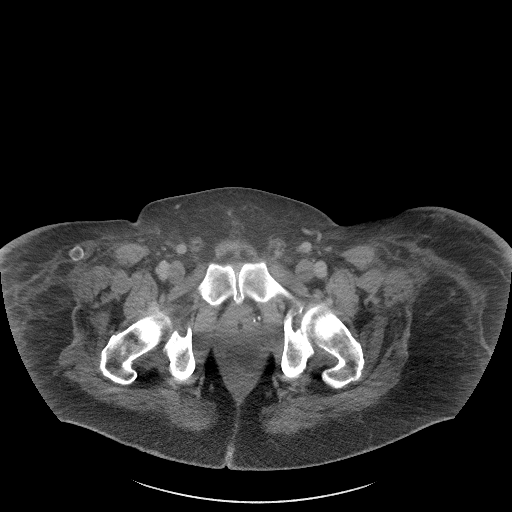
[im 23/100  soft-tissue]
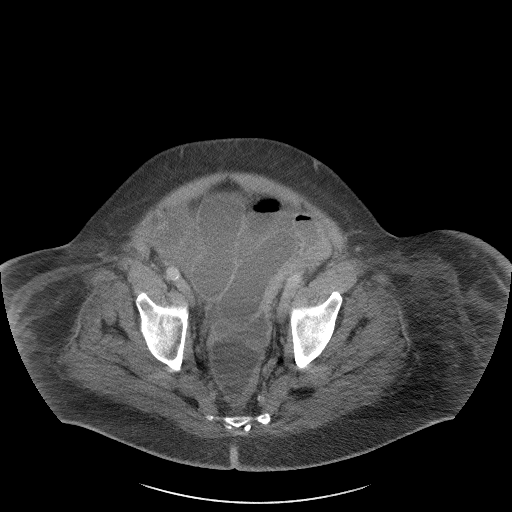
[im 28/100  soft-tissue]
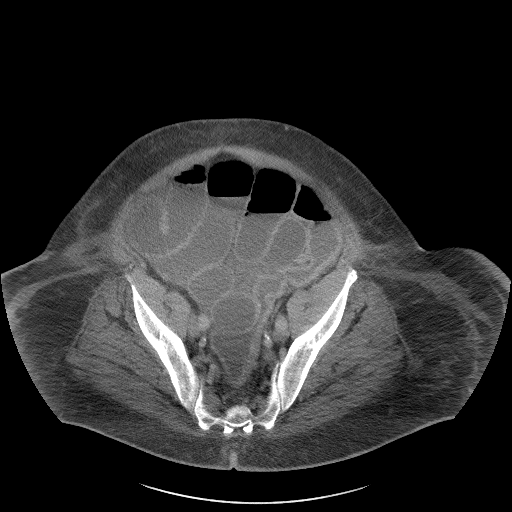
[im 34/100  soft-tissue]
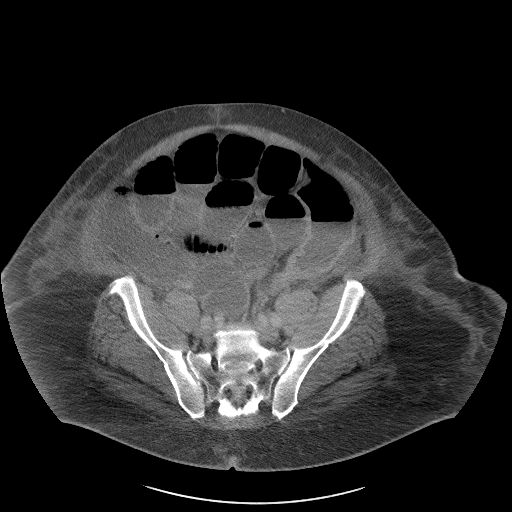
[im 45/100  soft-tissue]
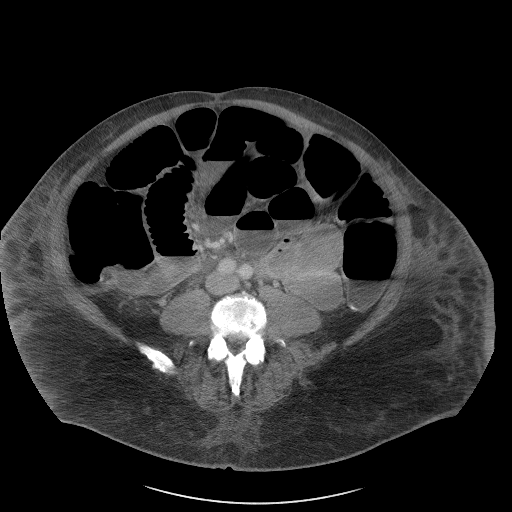
[im 50/100  soft-tissue]
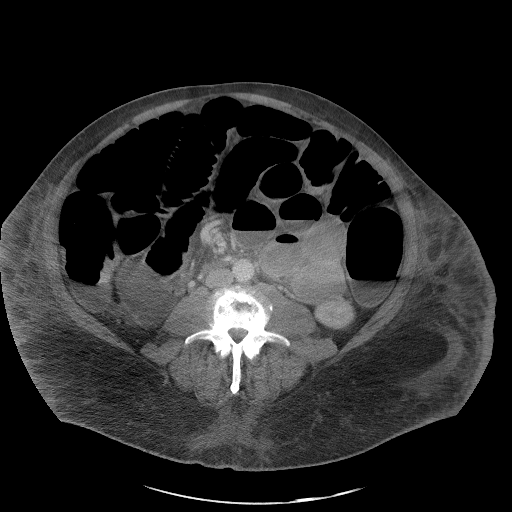
[im 56/100  soft-tissue]
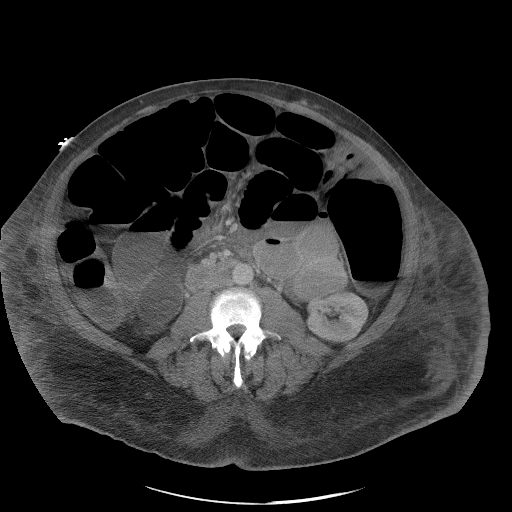
[im 67/100  soft-tissue]
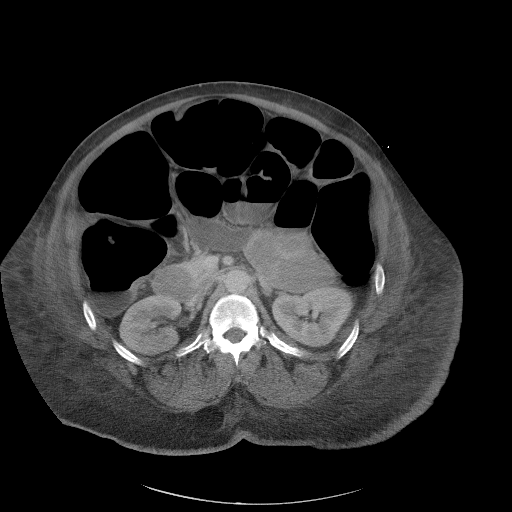
[im 67/100  bone]
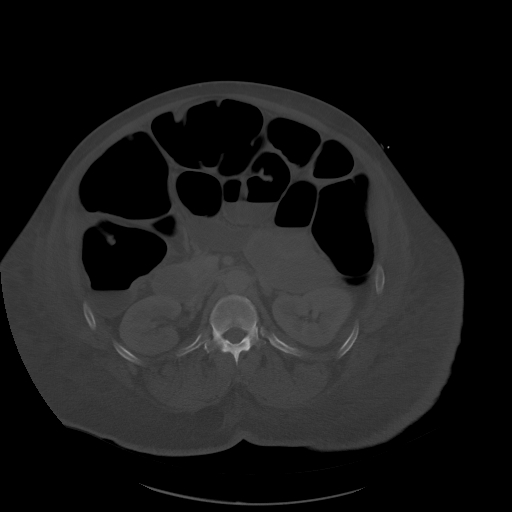
[im 72/100  soft-tissue]
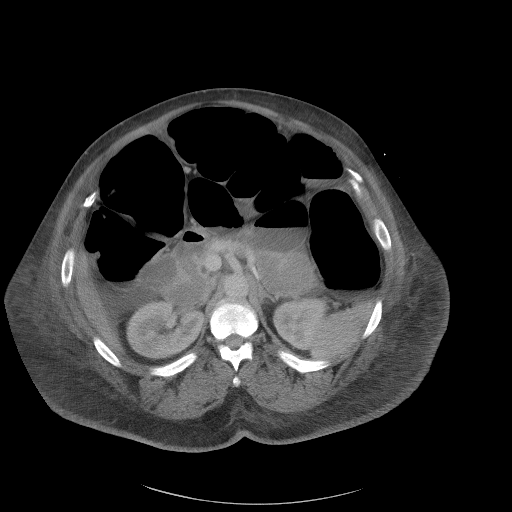
[im 78/100  soft-tissue]
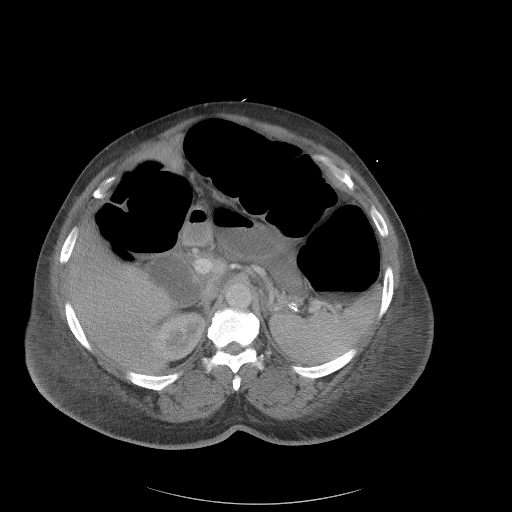
[im 89/100  soft-tissue]
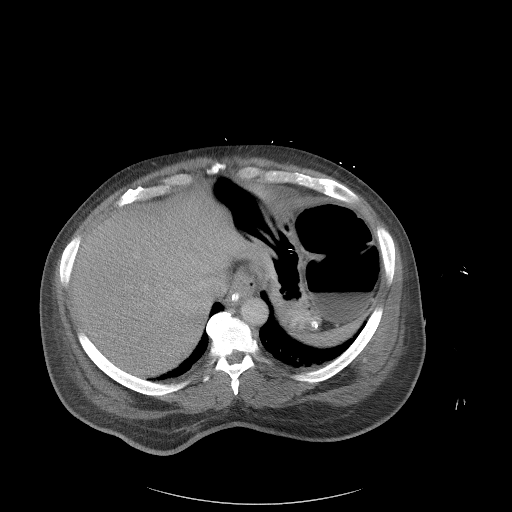
[im 94/100  soft-tissue]
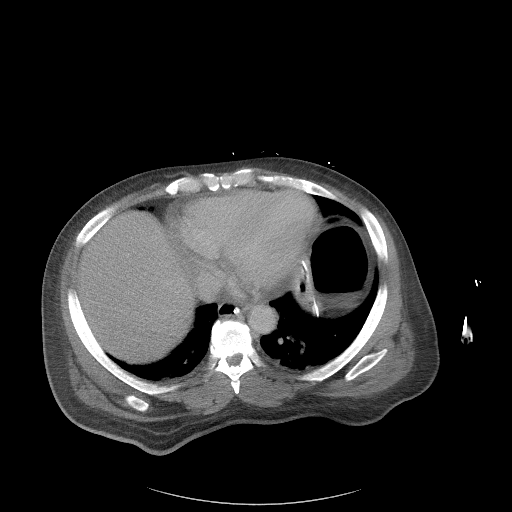

[Series 6: a/p w/ cor · coronal · 1.02mm/px · 3 of 180 slices shown]
[im 60/180  soft-tissue]
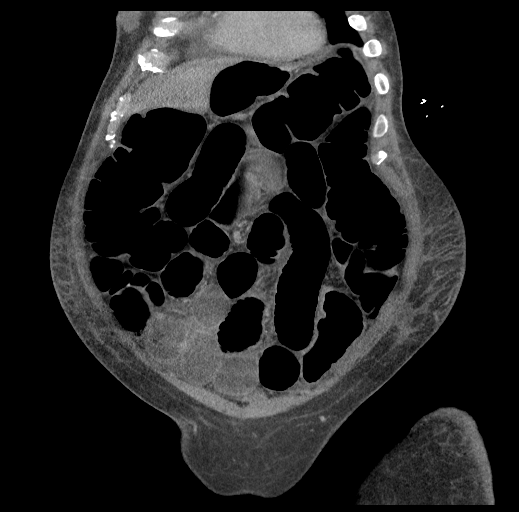
[im 80/180  soft-tissue]
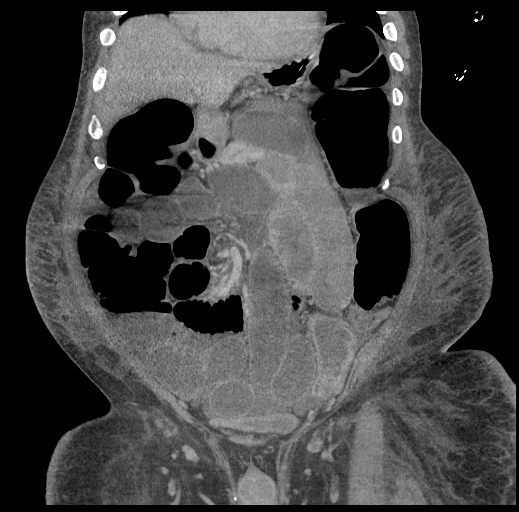
[im 100/180  soft-tissue]
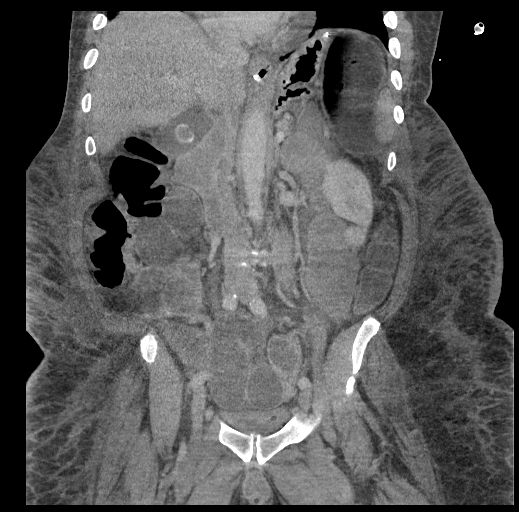

[16 of 46 positions shown; findings below may reference images not displayed]

FINDINGS: Lower chest: Lung bases are clear.  Mild cardiomegaly.

Hepatobiliary: Liver and biliary tree are normal. There is a 2.4 cm
stone over the gallbladder.

Pancreas: Normal.

Spleen: Normal.

Adrenals/Urinary Tract: Adrenal glands are normal. Kidneys are
normal in size without hydronephrosis or focal mass. No evidence of
nephrolithiasis. Ureters are normal. Foley catheter is present
within a decompressed bladder.

Stomach/Bowel: Nasogastric tube is present with tip over the
posterior gastric fundus. Air is present throughout the colon with
fluid over the rectosigmoid colon. There are numerous air and
fluid-filled dilated small bowel loops measuring up to 4.5 cm in
diameter. No evidence of pneumatosis and no evidence of free
peritoneal air. Findings suggest a distal early/partial small bowel
obstruction and less likely ileus. No definite transition point
visualized. Appendix not visualized.

Vascular/Lymphatic: Abdominal aorta is normal caliber. No
significant adenopathy.

Reproductive: Normal.

Other: Tiny amount of free peritoneal fluid. Mild to moderate
subcutaneous edema over the abdominal wall.

Musculoskeletal: Degenerative change of the spine.
IMPRESSION: 1. Numerous air and fluid-filled dilated small bowel loops measuring
up to 4.5 cm in diameter with air throughout the colon. No definite
transition point visualized. No evidence of pneumatosis or free
peritoneal air. Findings suggest a distal early/partial small bowel
obstruction and less likely ileus.
2. 2.4 cm gallstone unchanged.
3. Tubes and lines as described.
4. Moderate subcutaneous edema over the abdomen/pelvis.

## 2021-09-25 IMAGING — CR DG ABDOMEN 2V
5 series · 5 of 5 positions shown · non-contrast
Comparison: Multiple study since January 05, 2020

CLINICAL DATA: Worsening abdominal pain and distention.

EXAM:
ABDOMEN - 2 VIEW

[abdomen erect]
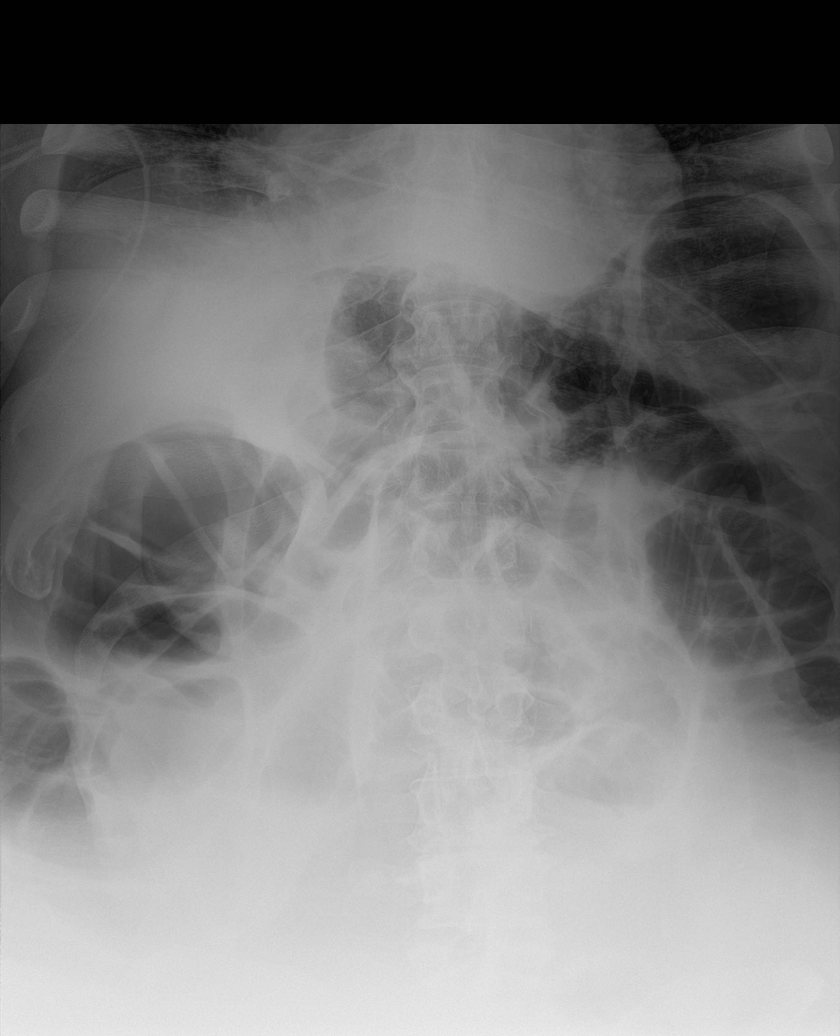

[abdomen supine (1 of 4)]
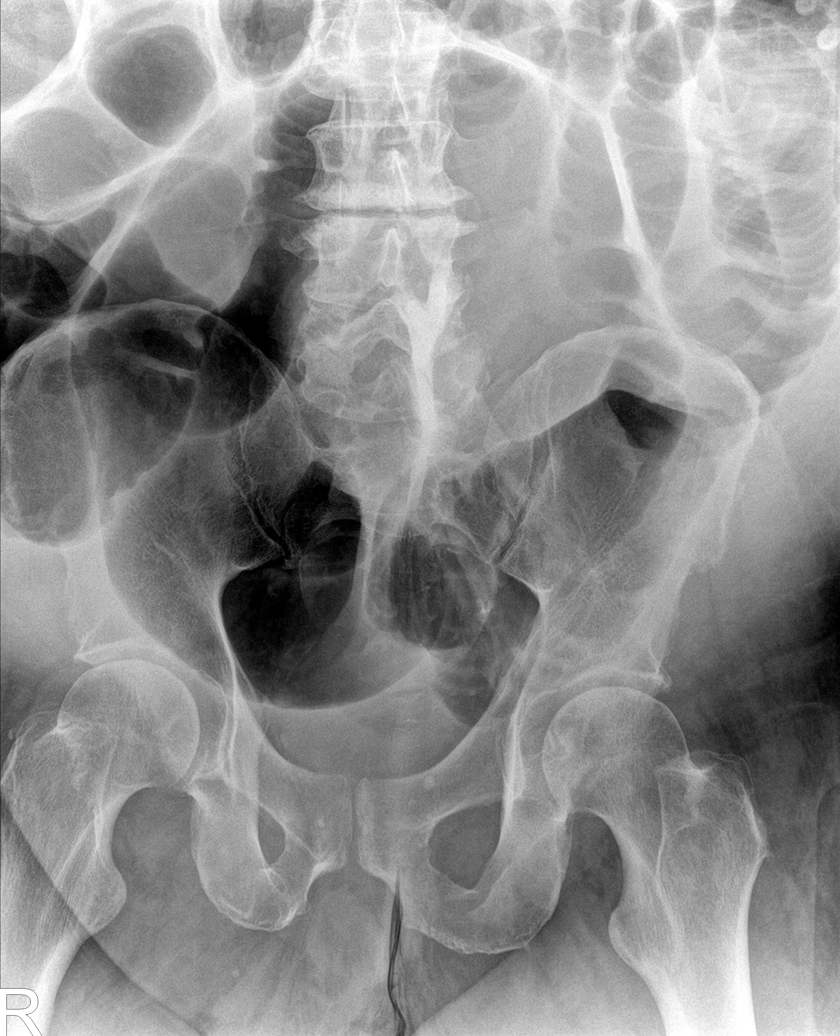

[abdomen supine (2 of 4)]
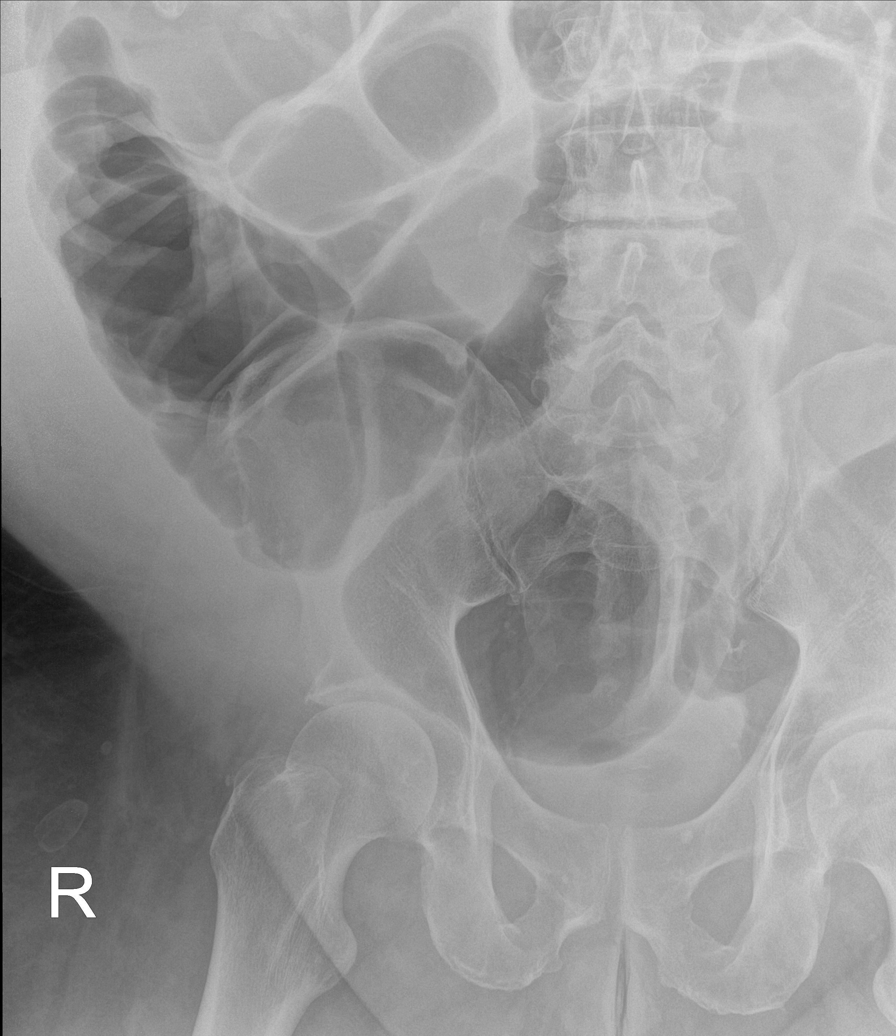

[abdomen supine (3 of 4)]
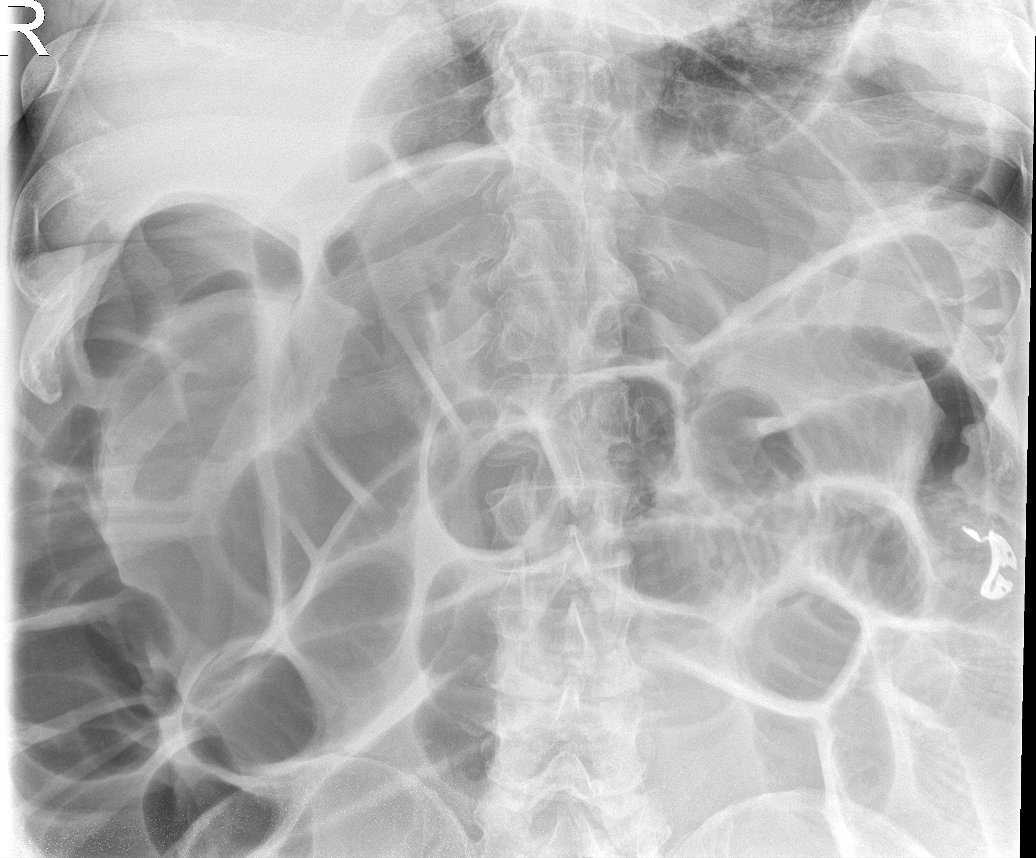

[abdomen supine (4 of 4)]
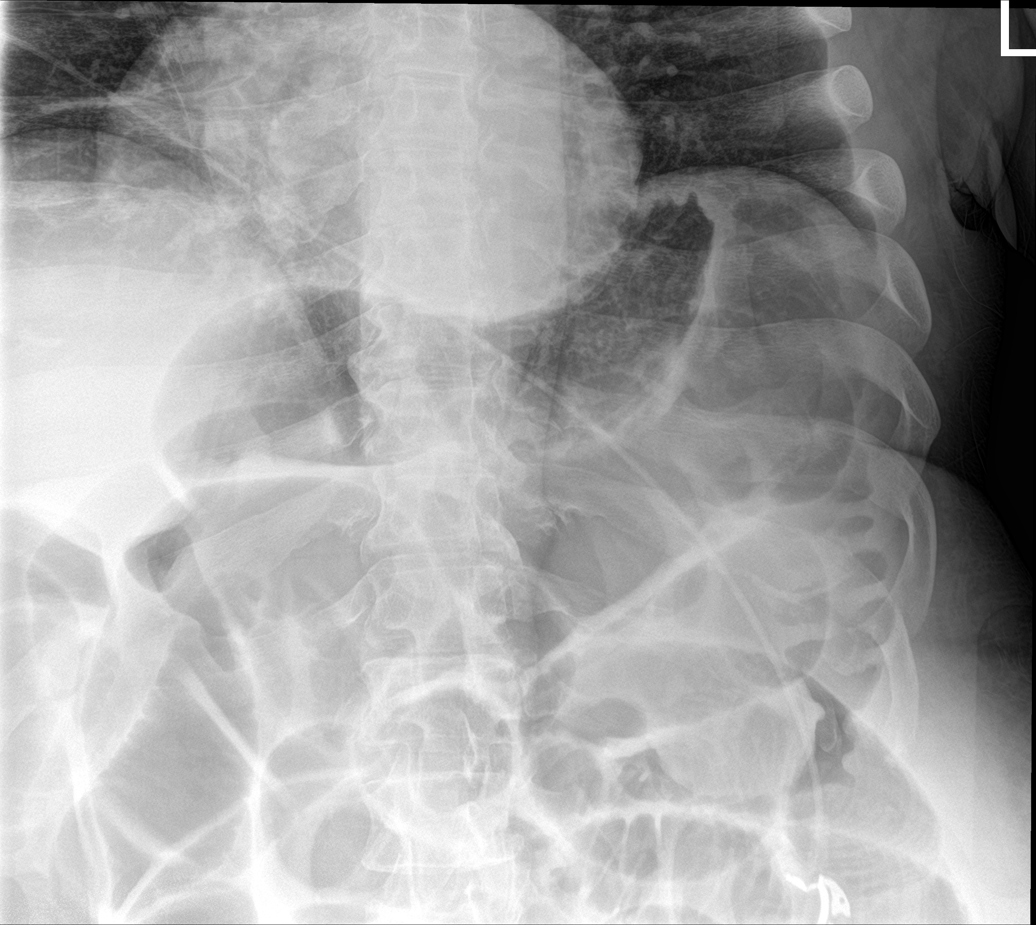

[5 of 5 positions shown; findings below may reference images not displayed]

FINDINGS: Diffuse colonic dilatation is identified, similar in the interval.
There are now dilated loops of small bowel, particularly in the left
side of the abdomen. The small bowel dilatation is apparently new.
No free air, portal venous gas, or pneumatosis identified.
IMPRESSION: 1. Diffuse colonic dilatation remains. There is now small bowel
dilatation as well, which is a new finding, particular in the left
side of the abdomen. The findings could represent ileus or distal
colonic obstruction.

## 2021-09-26 DIAGNOSIS — N1831 Chronic kidney disease, stage 3a: Secondary | ICD-10-CM | POA: Diagnosis not present

## 2021-09-26 DIAGNOSIS — Z89512 Acquired absence of left leg below knee: Secondary | ICD-10-CM | POA: Diagnosis not present

## 2021-09-26 DIAGNOSIS — M6281 Muscle weakness (generalized): Secondary | ICD-10-CM | POA: Diagnosis not present

## 2021-09-26 DIAGNOSIS — I10 Essential (primary) hypertension: Secondary | ICD-10-CM | POA: Diagnosis not present

## 2021-09-26 DIAGNOSIS — J189 Pneumonia, unspecified organism: Secondary | ICD-10-CM | POA: Diagnosis not present

## 2021-09-26 DIAGNOSIS — K5981 Ogilvie syndrome: Secondary | ICD-10-CM | POA: Diagnosis not present

## 2021-09-27 IMAGING — DX DG ABDOMEN 2V
3 series · 3 of 3 positions shown · non-contrast
Comparison: None.

CLINICAL DATA: Abdominal pain, distention

EXAM:
ABDOMEN - 2 VIEW

[x abdomen erect]
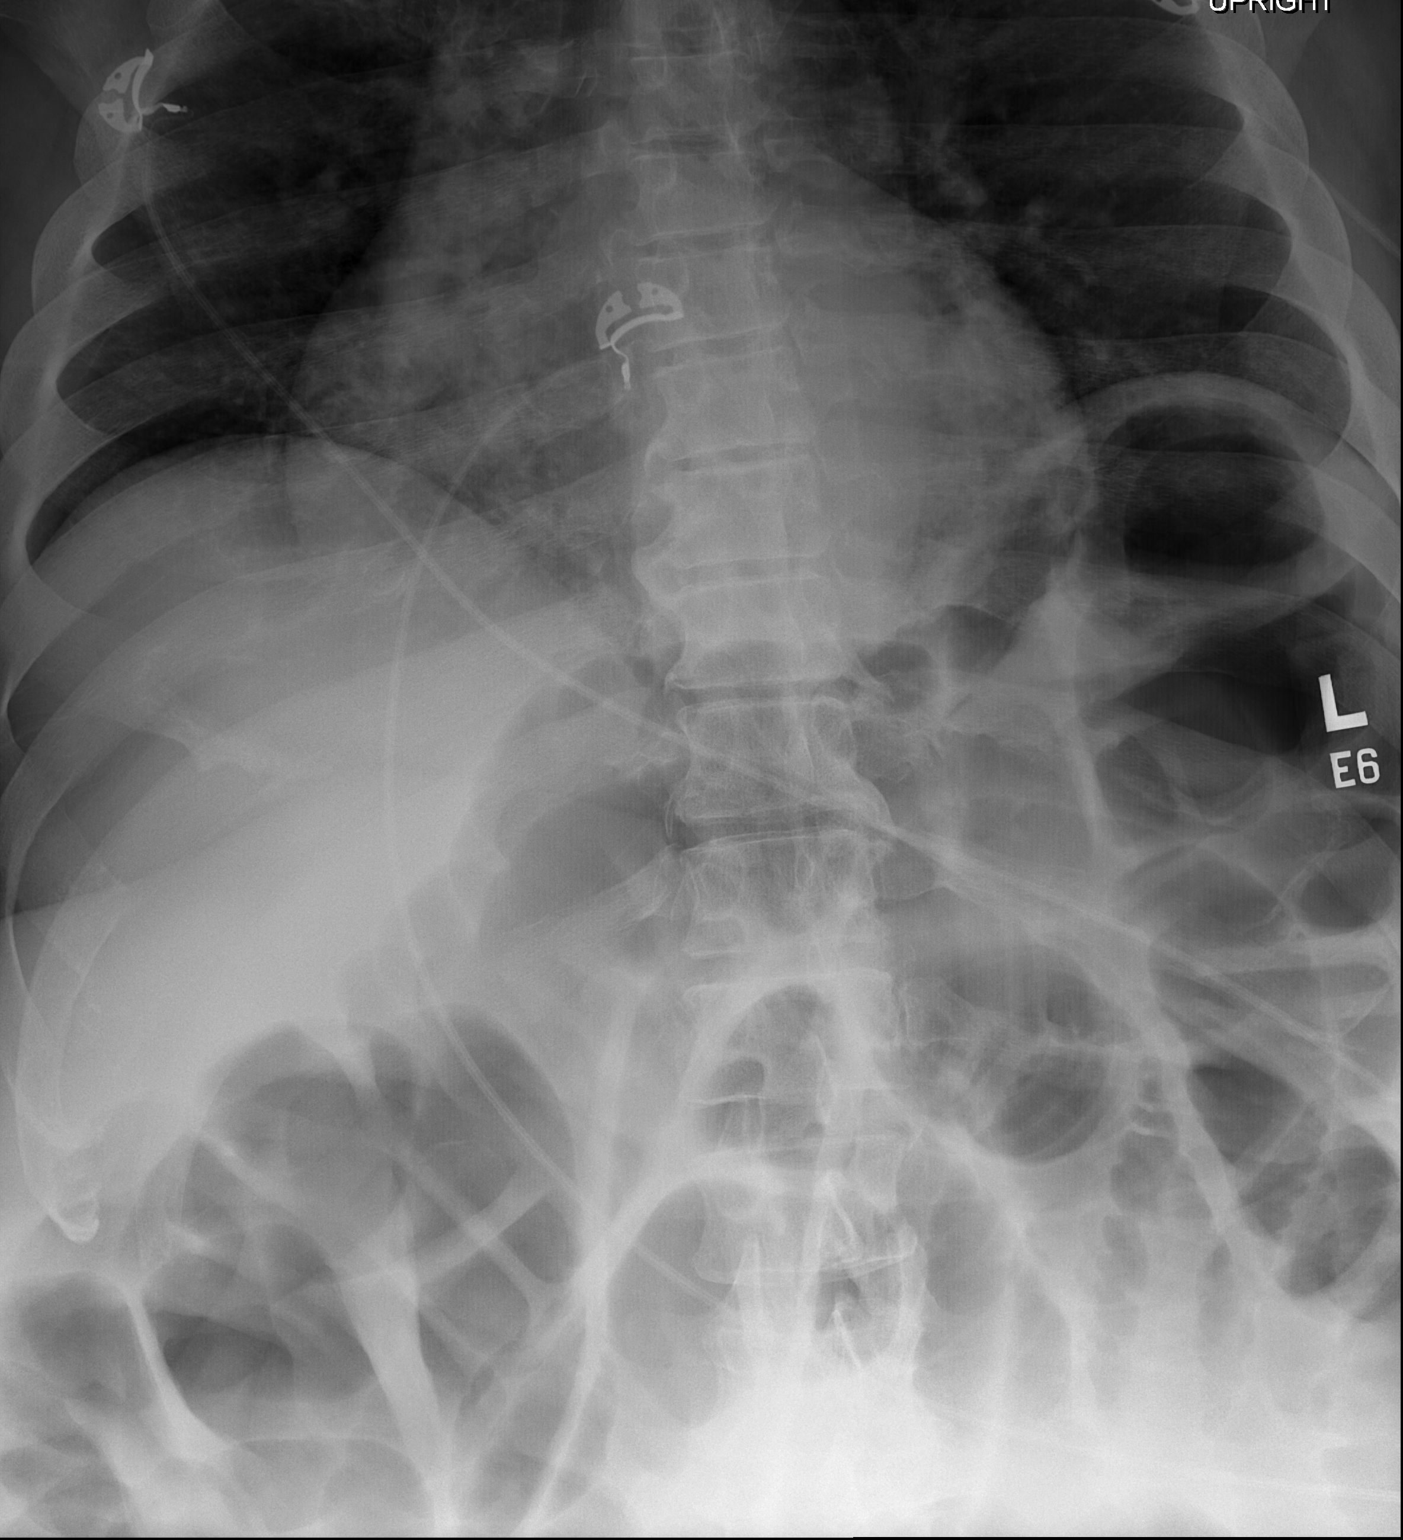

[t abdomen supine (1 of 2)]
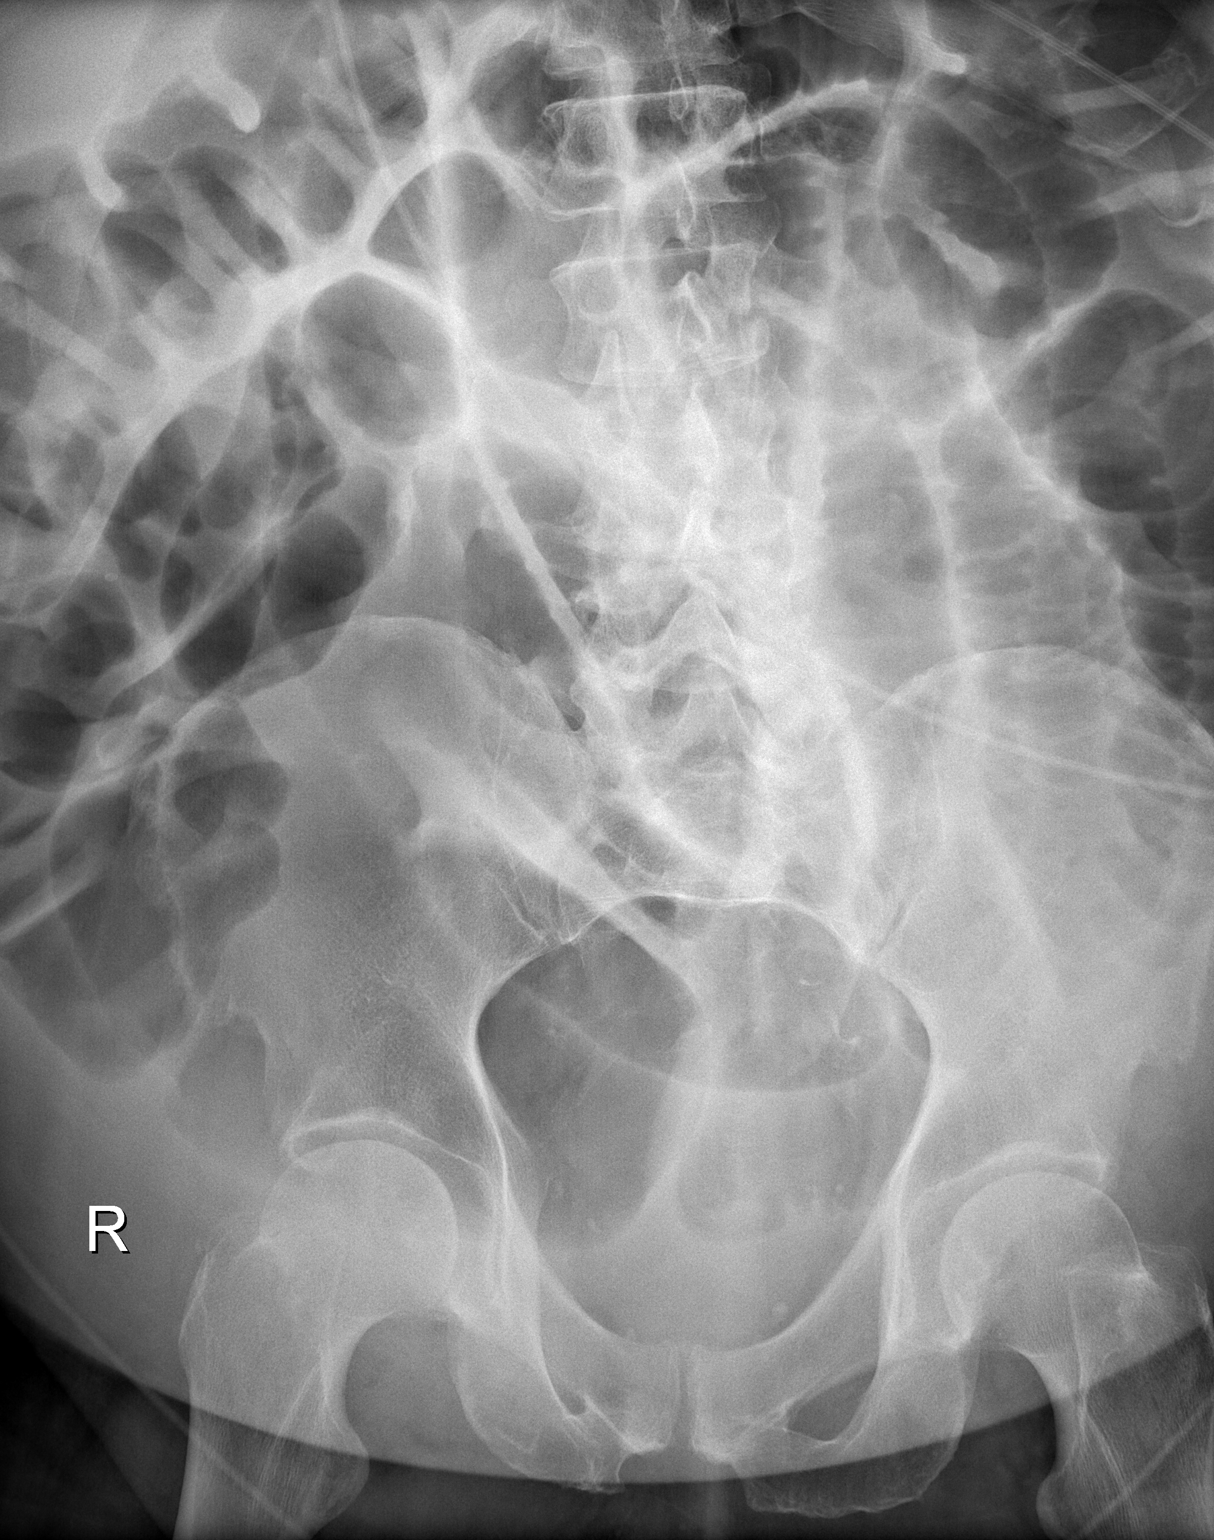

[t abdomen supine (2 of 2)]
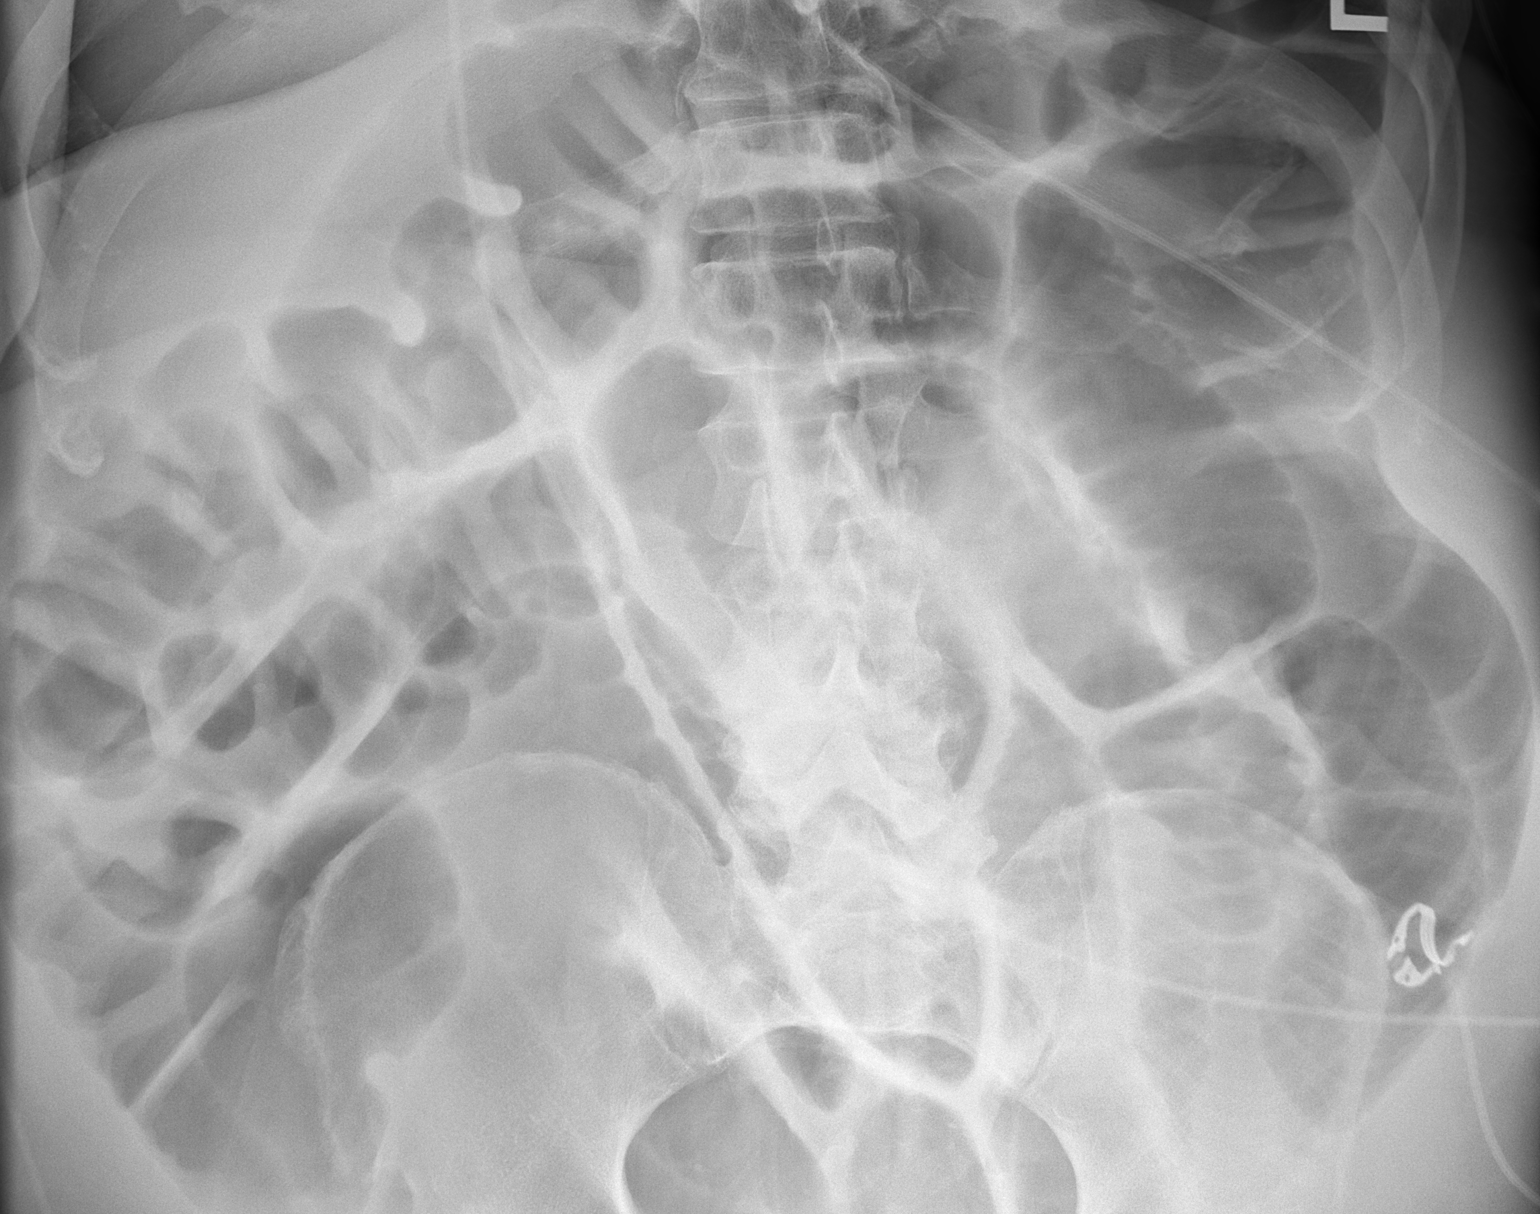

[3 of 3 positions shown; findings below may reference images not displayed]

FINDINGS: Diffuse gaseous distention of bowel compatible with ileus. No real
change since prior study. No organomegaly or free air.
IMPRESSION: Diffuse gaseous distention of bowel compatible with ileus. No
change.

## 2021-09-28 IMAGING — CR DG ABDOMEN 2V
2 series · 2 of 2 positions shown · non-contrast
Comparison: January 19, 2020 study obtained earlier in the day

CLINICAL DATA: Abdominal distension and pain

EXAM:
ABDOMEN - 2 VIEW

[abdomen erect]
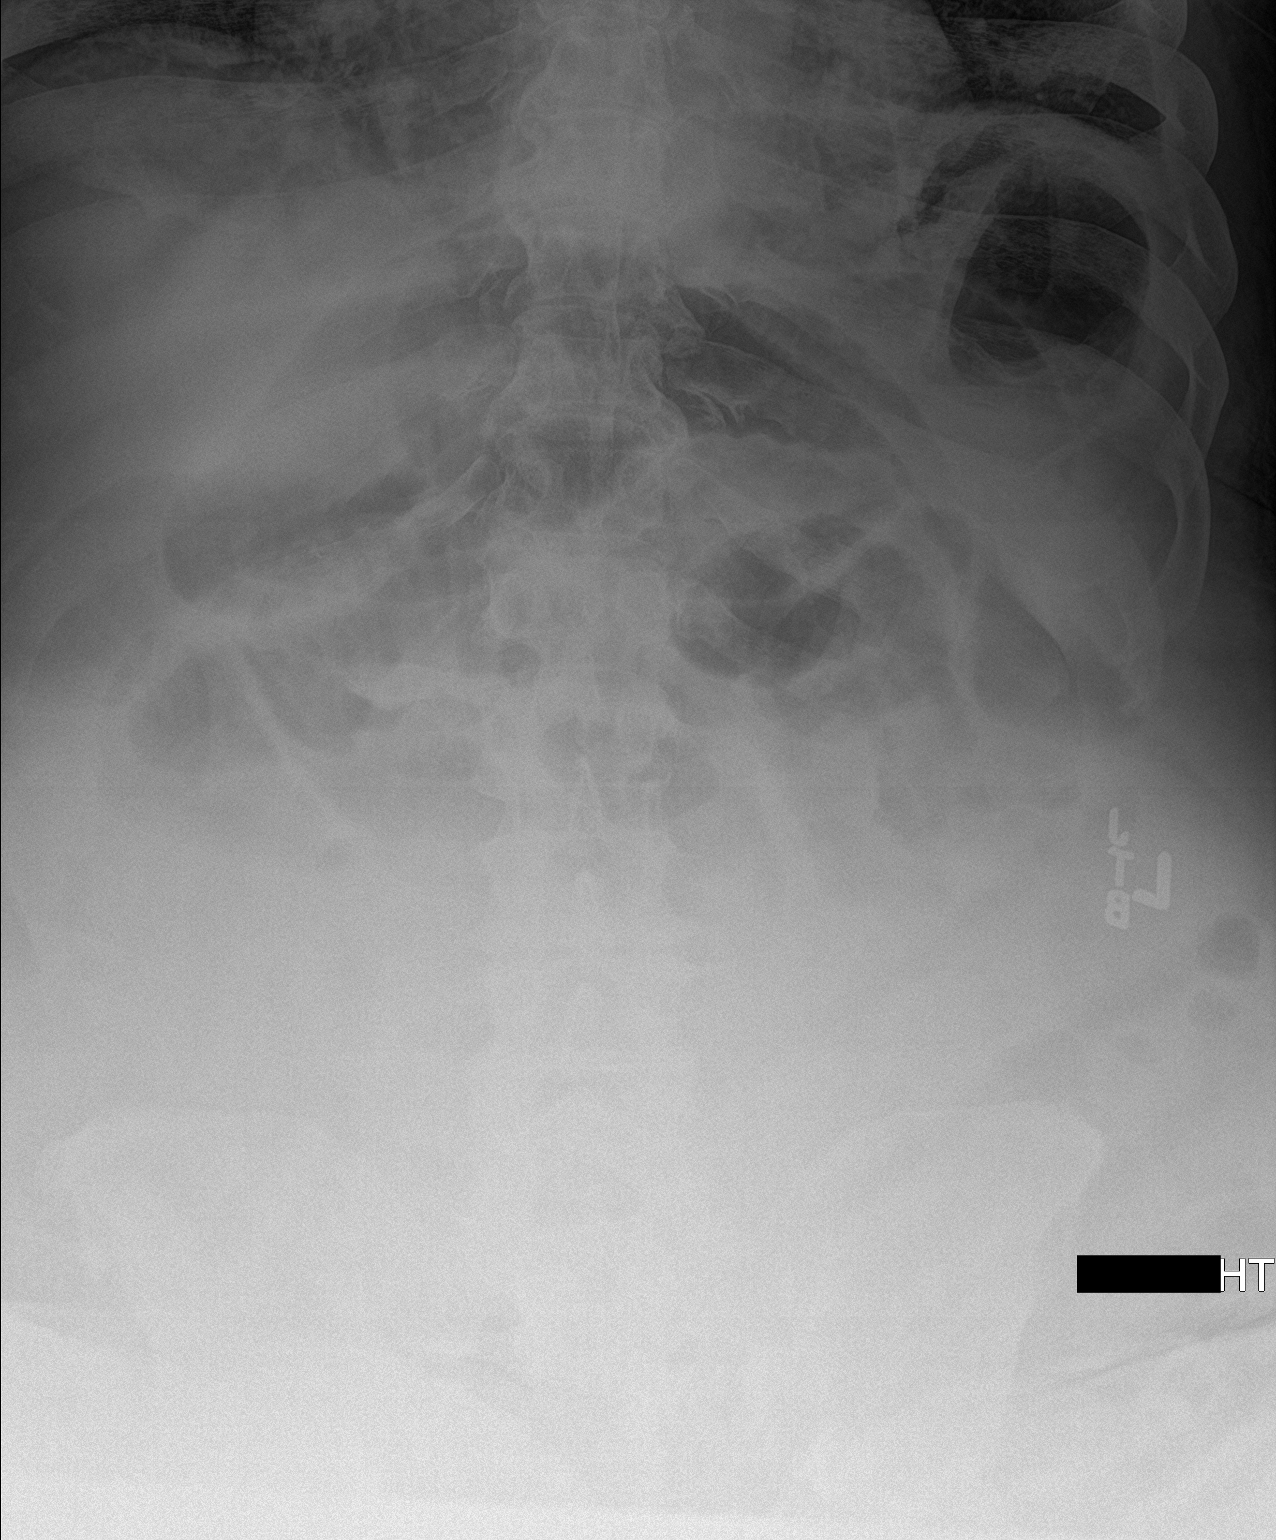

[abdomen supine]
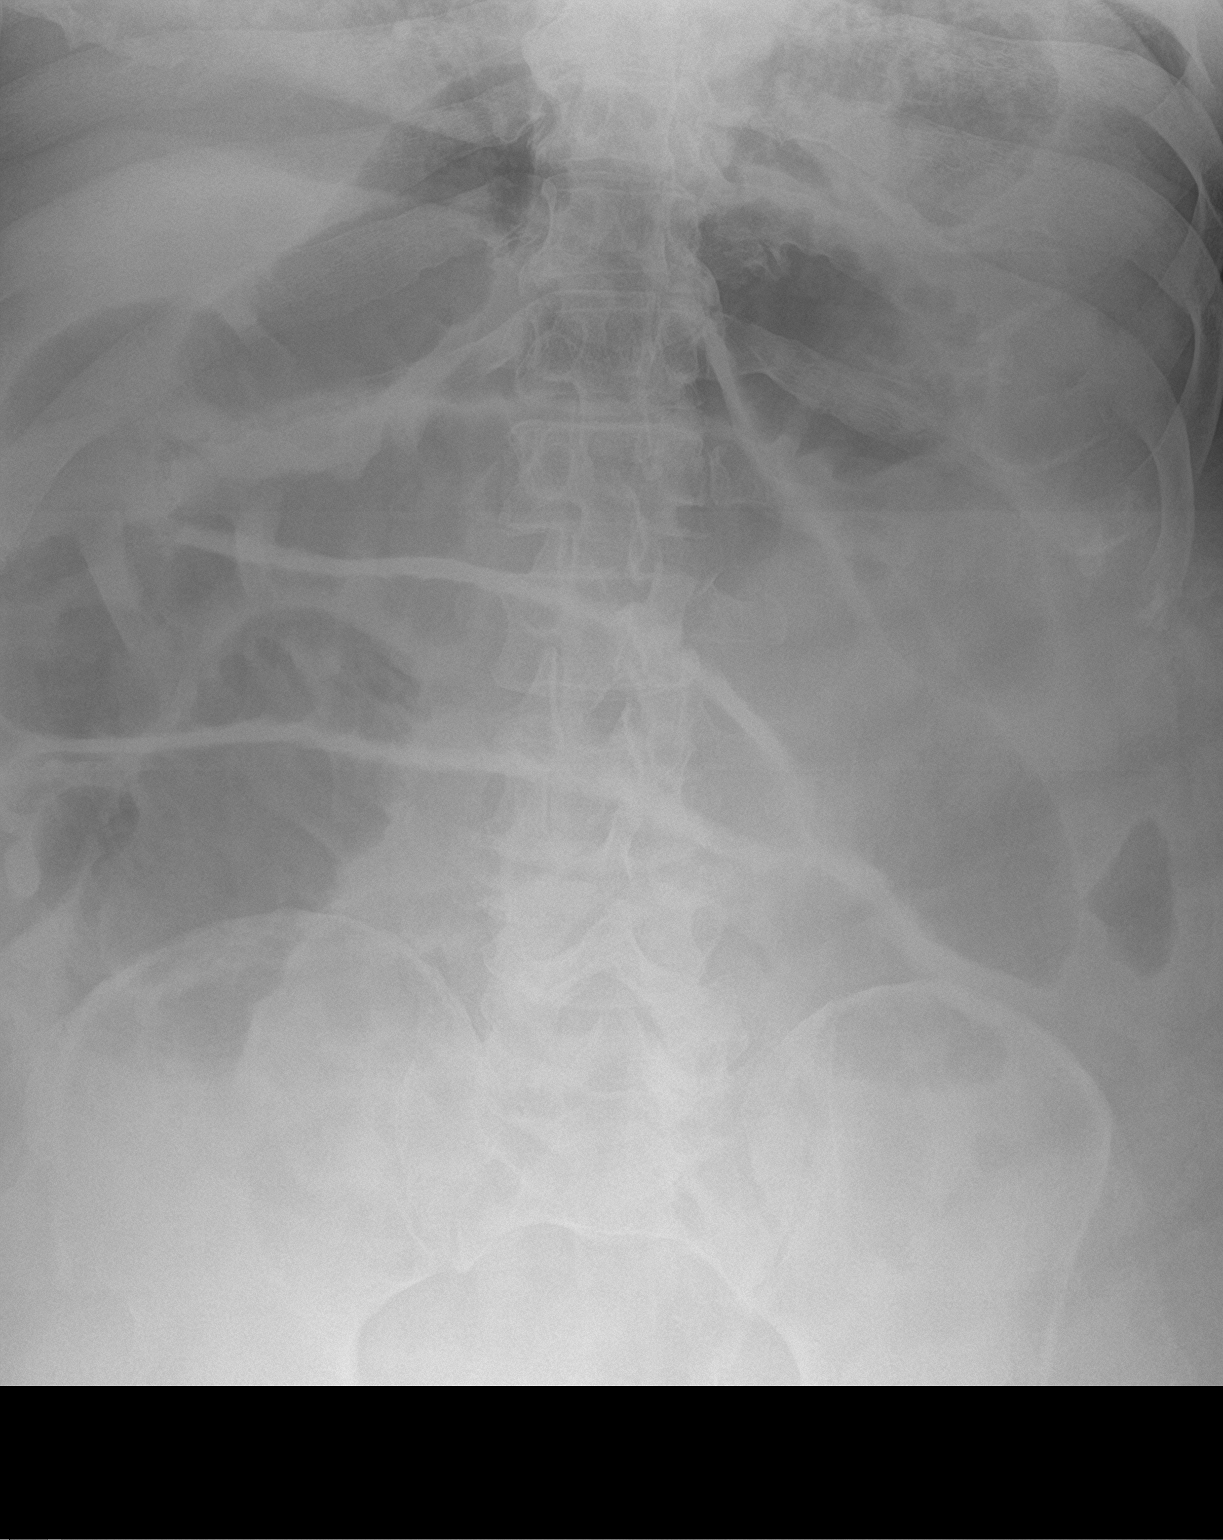

[2 of 2 positions shown; findings below may reference images not displayed]

FINDINGS: Supine and upright images were obtained. There remain multiple loops
of dilated small bowel without appreciable air-fluid levels. No
appreciable free air. Visualized lung bases clear. No abnormal
calcifications.
IMPRESSION: Persistent dilatation of multiple small bowel loops. No free air.
Appearance consistent with ileus, although a degree of small bowel
obstruction cannot be excluded.

## 2021-09-28 IMAGING — CR DG ABDOMEN 2V
3 series · 3 of 3 positions shown · non-contrast
Comparison: 01/18/2020

CLINICAL DATA: Abdominal distension

EXAM:
ABDOMEN - 2 VIEW

[abdomen supine (1 of 3)]
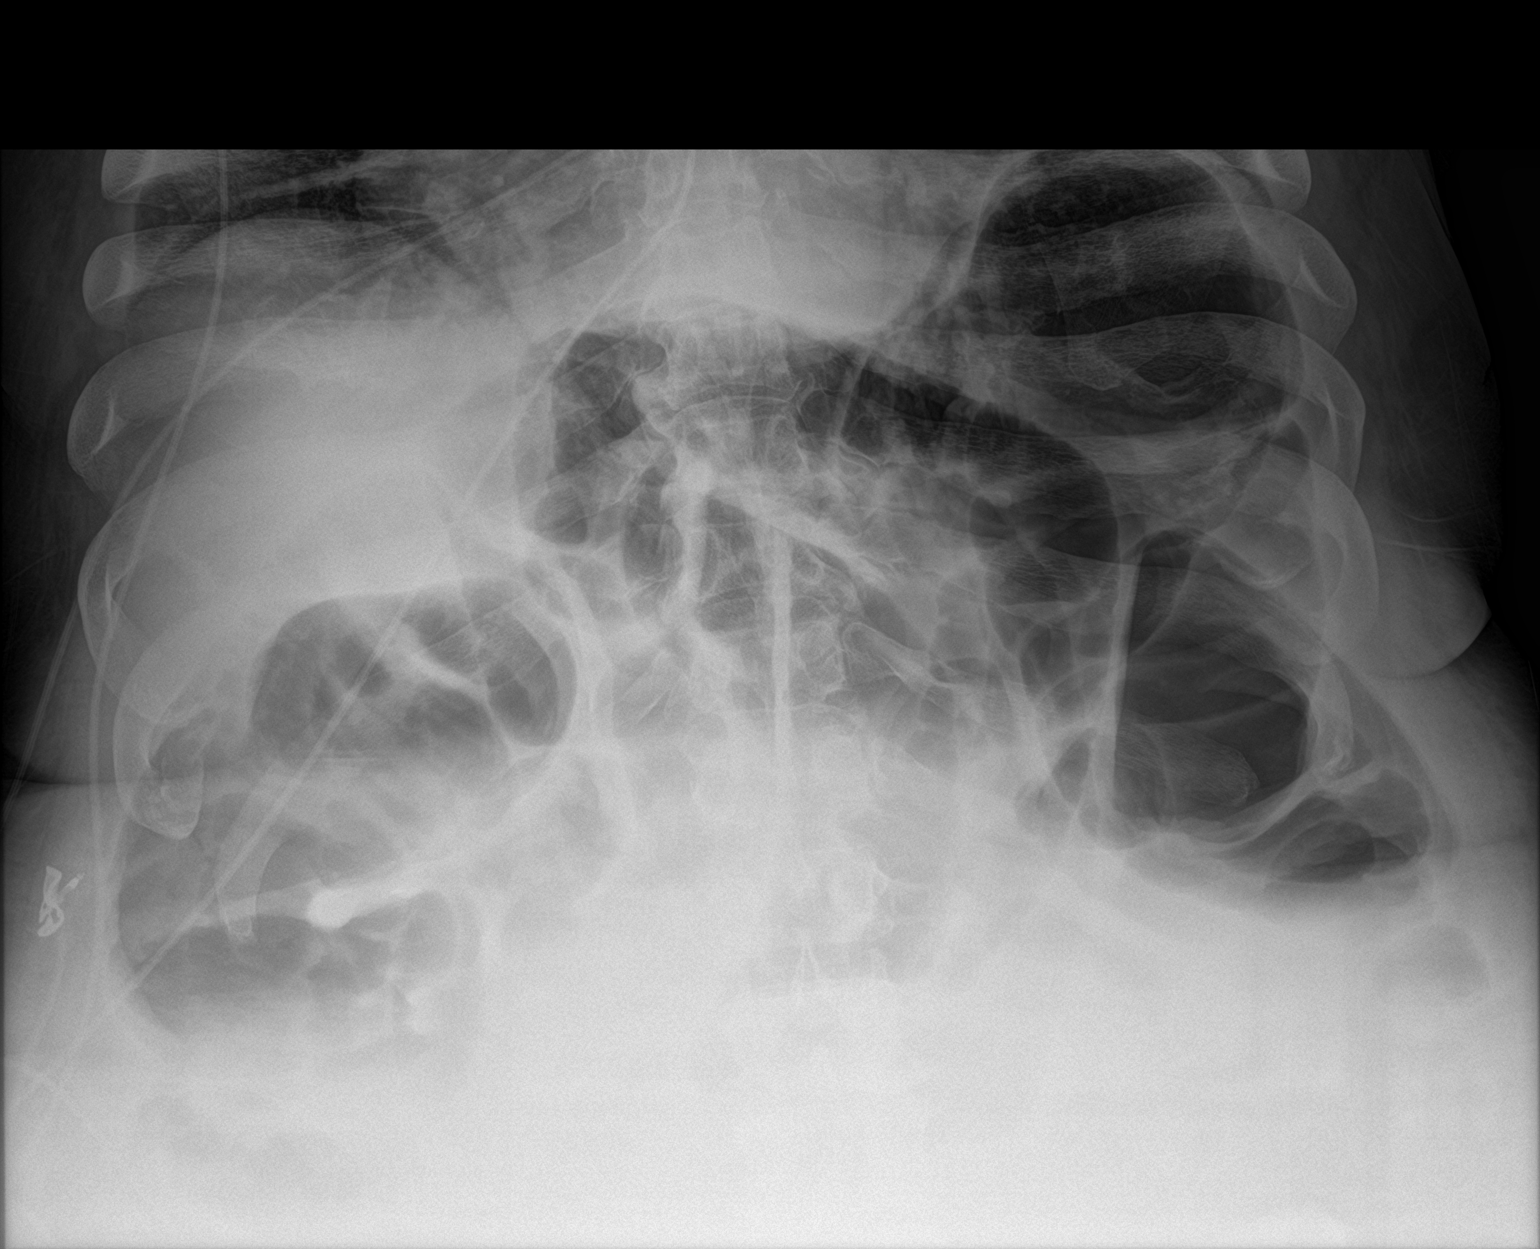

[abdomen supine (2 of 3)]
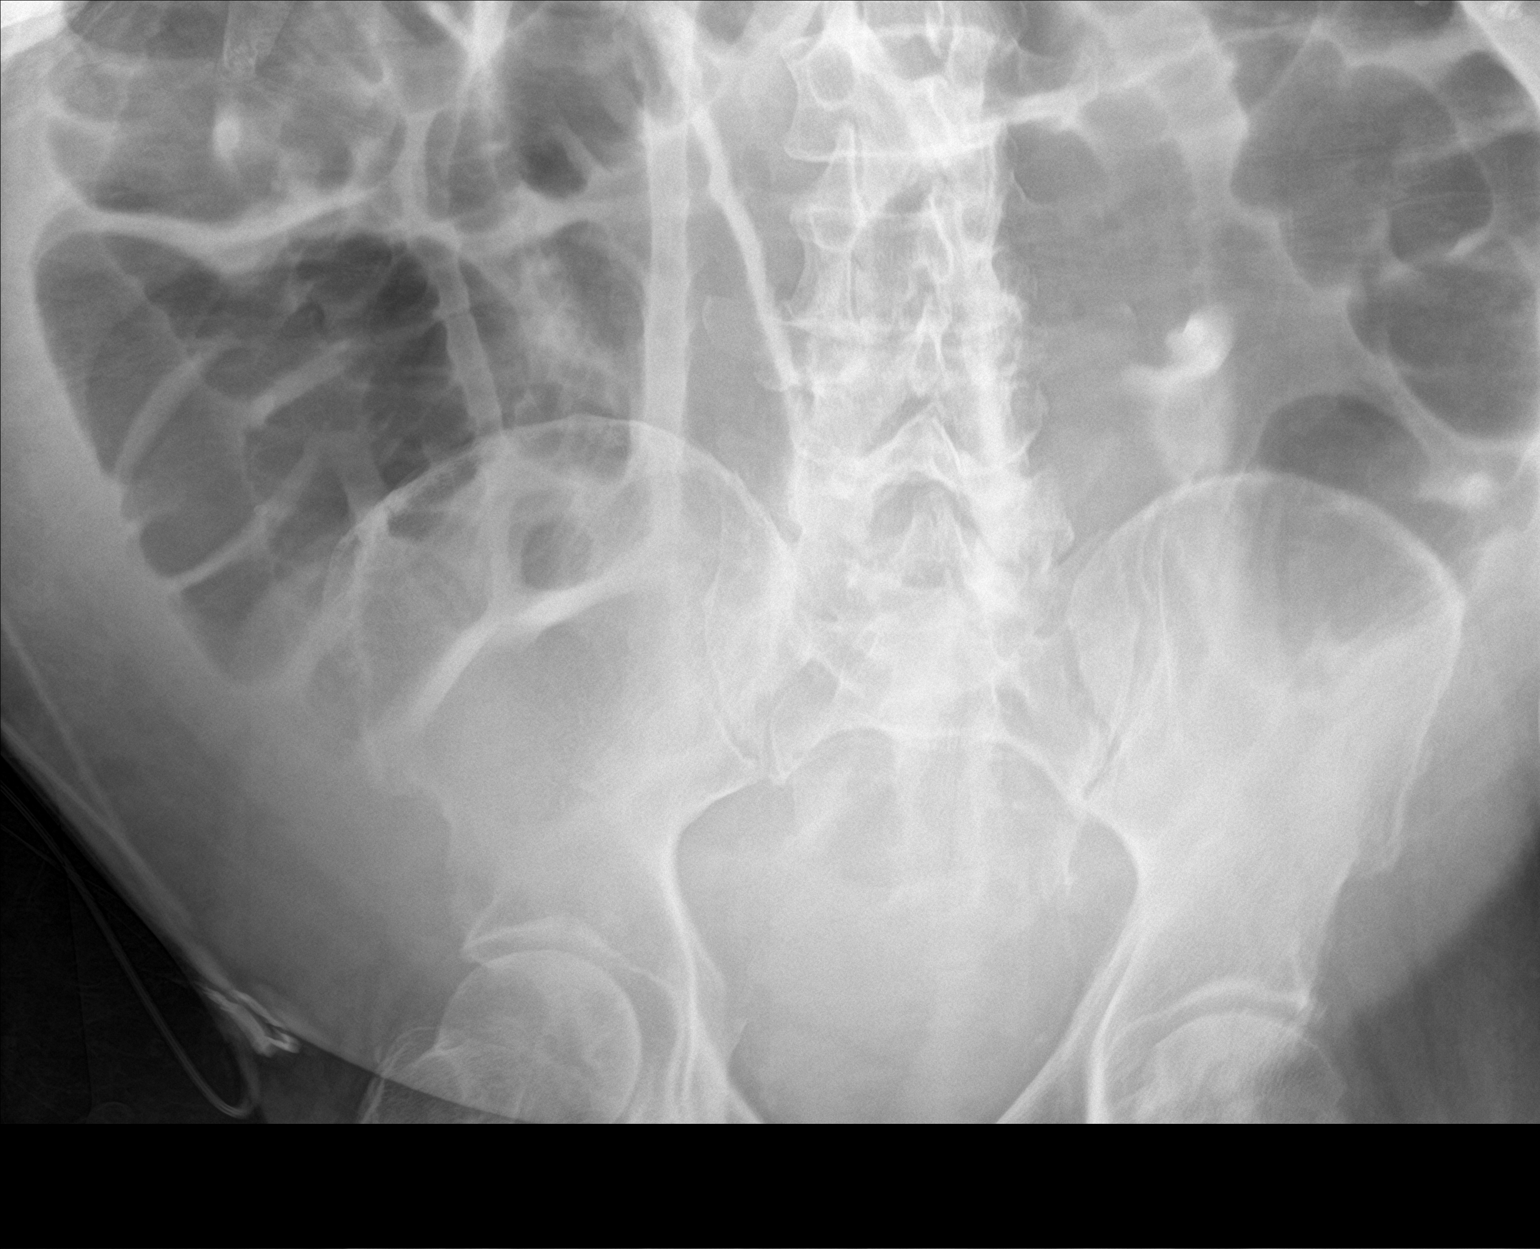

[abdomen supine (3 of 3)]
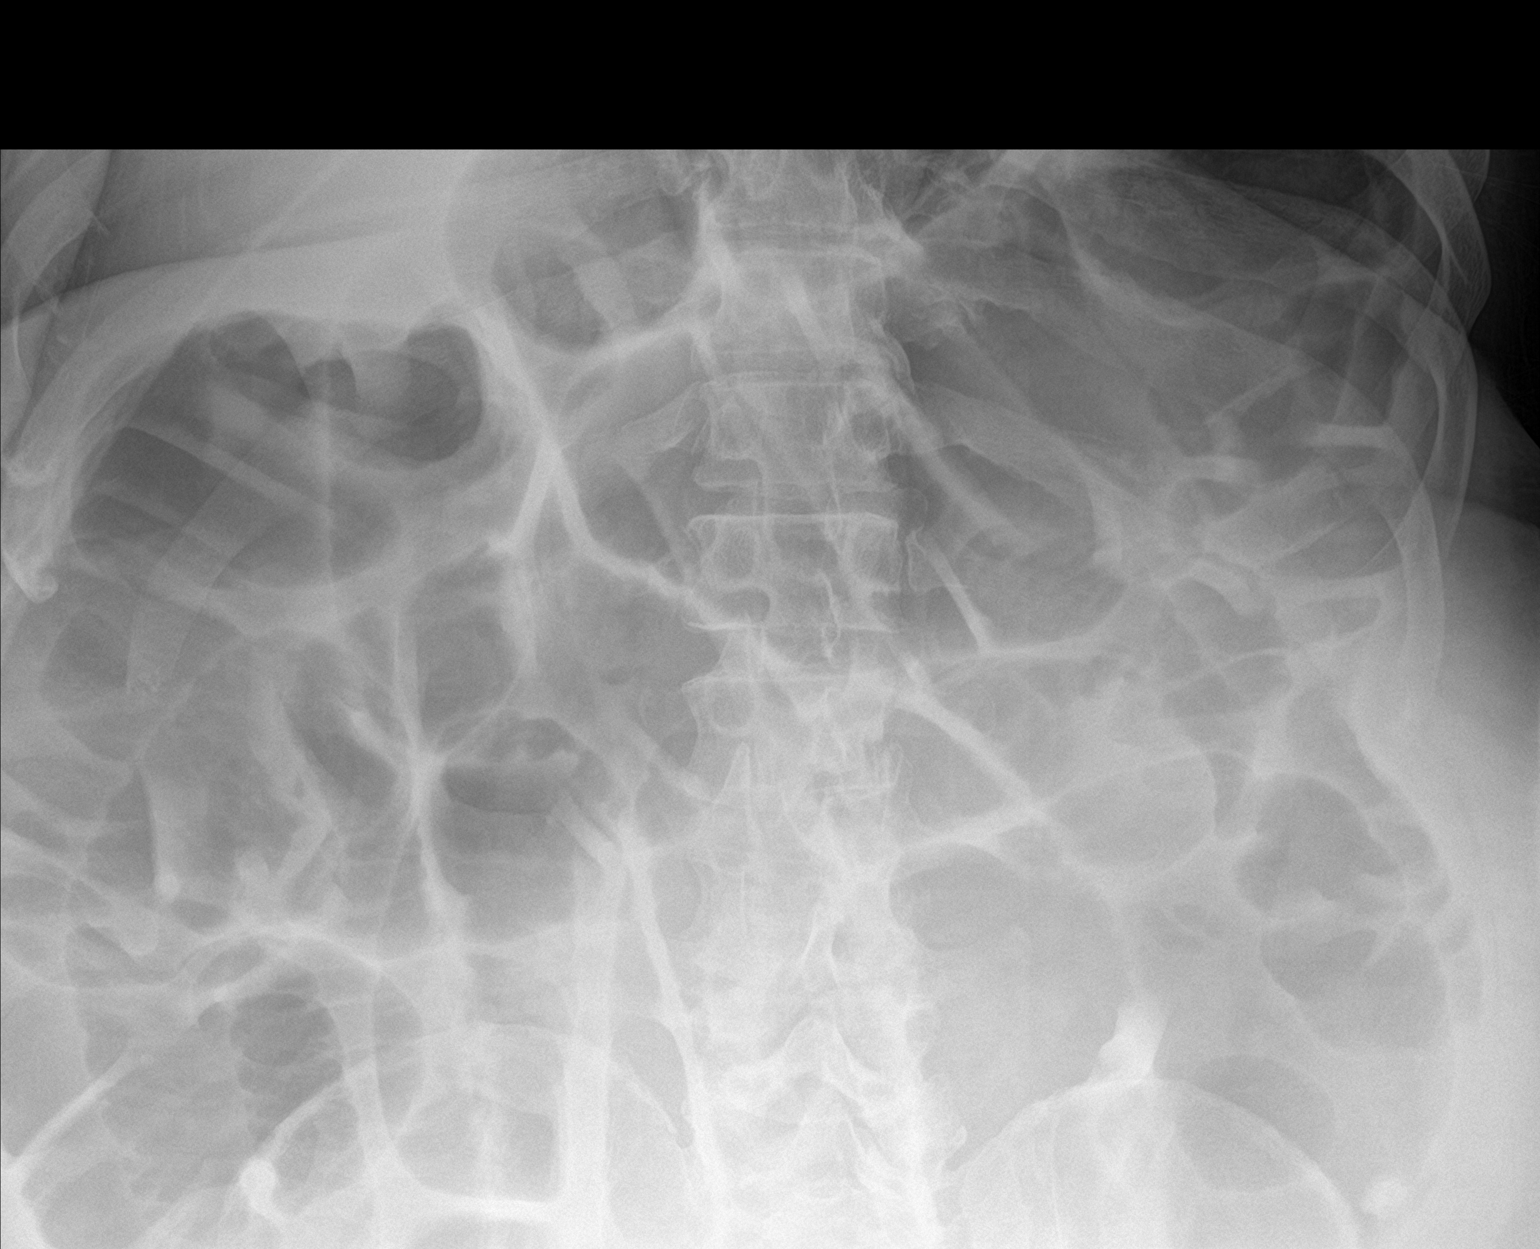

[3 of 3 positions shown; findings below may reference images not displayed]

FINDINGS: Extensive gaseous distention of large and small bowel loops
throughout the abdomen, similar in appearance to prior. No gross
free intraperitoneal air.
IMPRESSION: Similar extensive gaseous distention of large and small bowel loops
throughout the abdomen.

## 2021-09-30 DIAGNOSIS — N1831 Chronic kidney disease, stage 3a: Secondary | ICD-10-CM | POA: Diagnosis not present

## 2021-09-30 DIAGNOSIS — M6281 Muscle weakness (generalized): Secondary | ICD-10-CM | POA: Diagnosis not present

## 2021-09-30 DIAGNOSIS — I10 Essential (primary) hypertension: Secondary | ICD-10-CM | POA: Diagnosis not present

## 2021-09-30 DIAGNOSIS — Z89512 Acquired absence of left leg below knee: Secondary | ICD-10-CM | POA: Diagnosis not present

## 2021-09-30 DIAGNOSIS — K5981 Ogilvie syndrome: Secondary | ICD-10-CM | POA: Diagnosis not present

## 2021-09-30 IMAGING — CR DG ABDOMEN 2V
3 series · 3 of 3 positions shown · non-contrast
Comparison: 01/19/2020 radiographs and earlier, CT Abdomen and
Pelvis 01/16/2020.

CLINICAL DATA: 59-year-old male with abdominal pain. Bowel
obstruction versus ileus.

EXAM:
ABDOMEN - 2 VIEW

[abdomen erect]
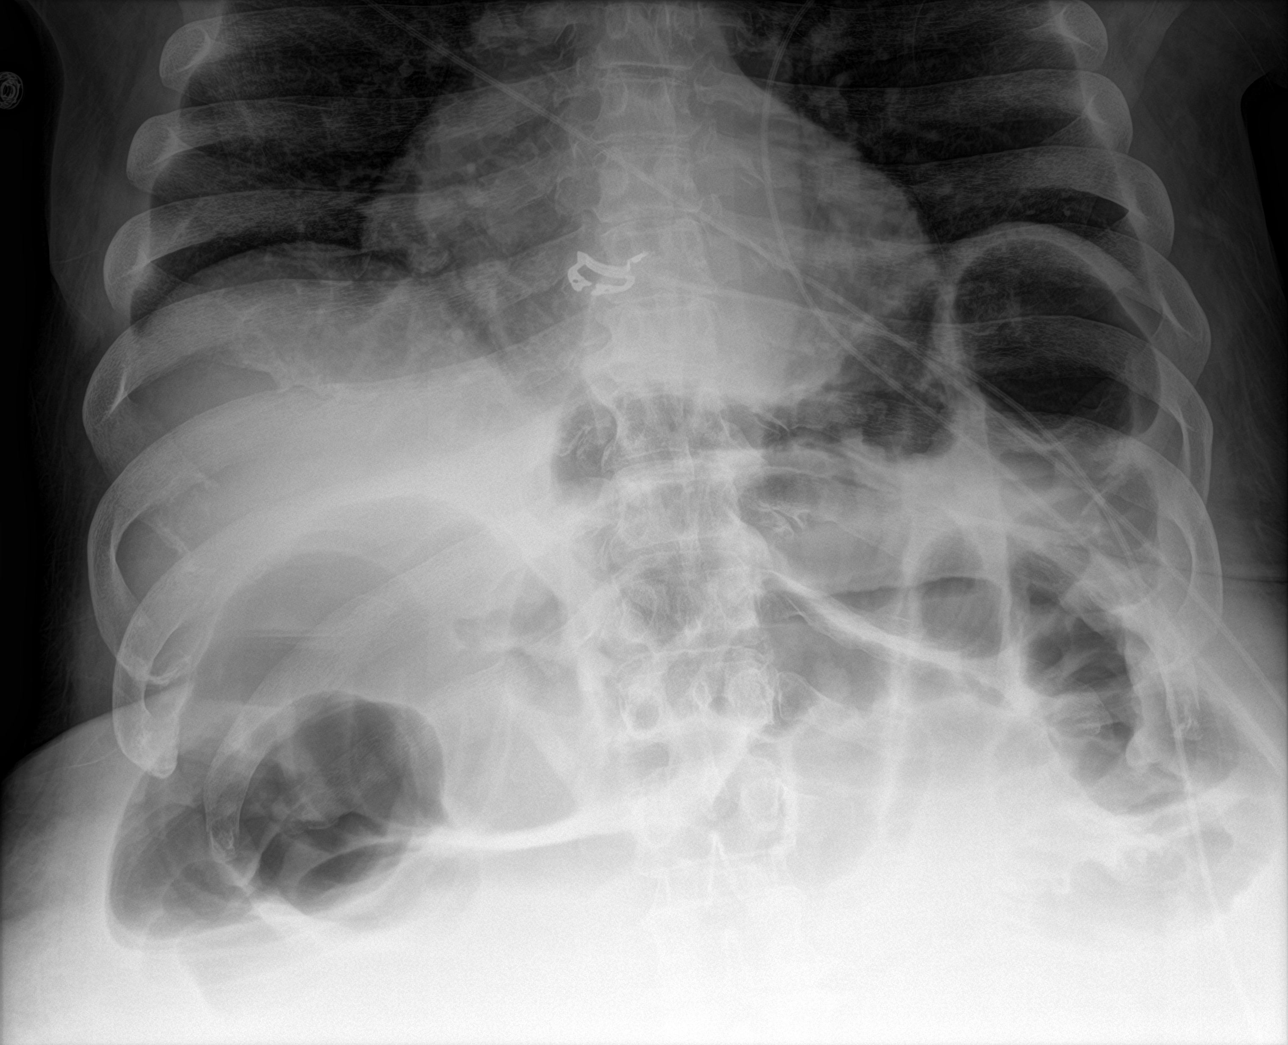

[abdomen supine (1 of 2)]
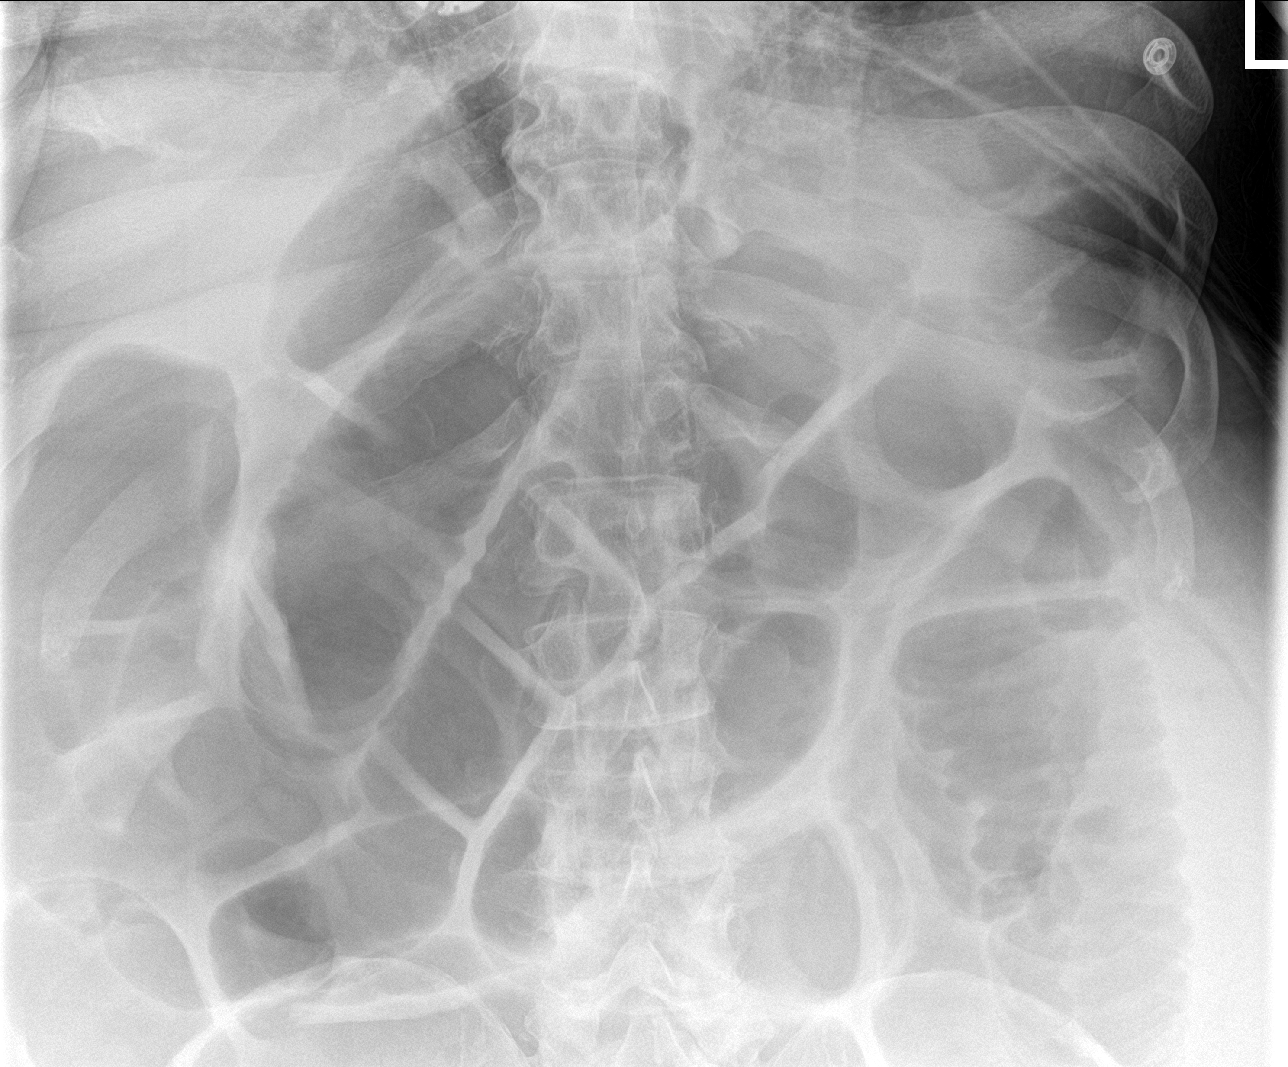

[abdomen supine (2 of 2)]
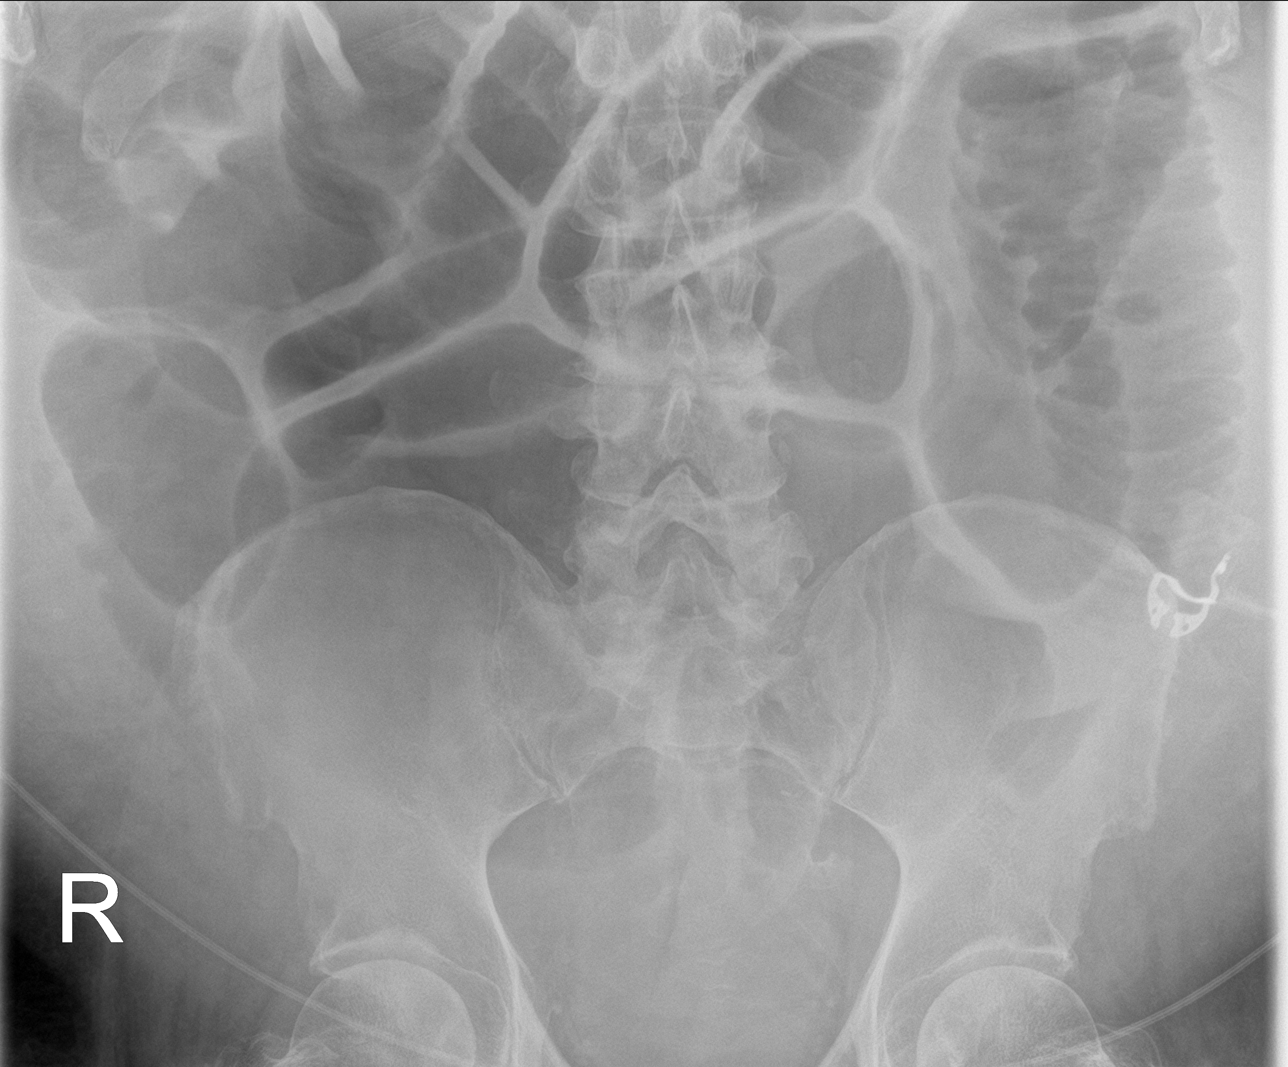

[3 of 3 positions shown; findings below may reference images not displayed]

FINDINGS: Upright and supine views. Negative lung bases. No pneumoperitoneum
identified.

Gas-filled small bowel loops throughout the abdomen and pelvis over
this series of exams this month, with regressed dilatation of the
sigmoid colon since 01/09/2020. Today the bowel gas pattern has not
significantly changed since 01/19/2020, with increased small bowel
gas since 01/09/2020.

No acute osseous abnormality identified.
IMPRESSION: 1. Bowel-gas pattern has been abnormal this month, with largely
resolved dilatation of the sigmoid colon since 01/05/2020, but
otherwise no improvement. Versus a relatively normal gas pattern
evident on chest CTA in [REDACTED] this year.
2. The constellation favors persistent severe ileus over a
mechanical obstruction. No pneumoperitoneum is identified.
3. Lung bases appear negative.

## 2021-10-01 IMAGING — US US ABDOMEN LIMITED
1 series · 14 of 19 positions shown · non-contrast
Comparison: CT dated January 16, 2020

CLINICAL DATA: Ascites

EXAM:
LIMITED ABDOMEN ULTRASOUND FOR ASCITES
TECHNIQUE: Limited ultrasound survey for ascites was performed in all four
abdominal quadrants.

[Series 1: us abdomen limited · 14 of 19 slices shown]
[im 1/19]
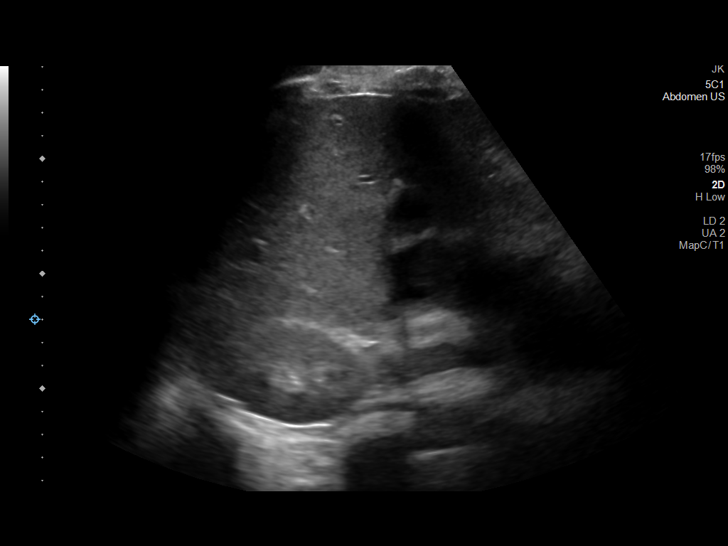
[im 3/19]
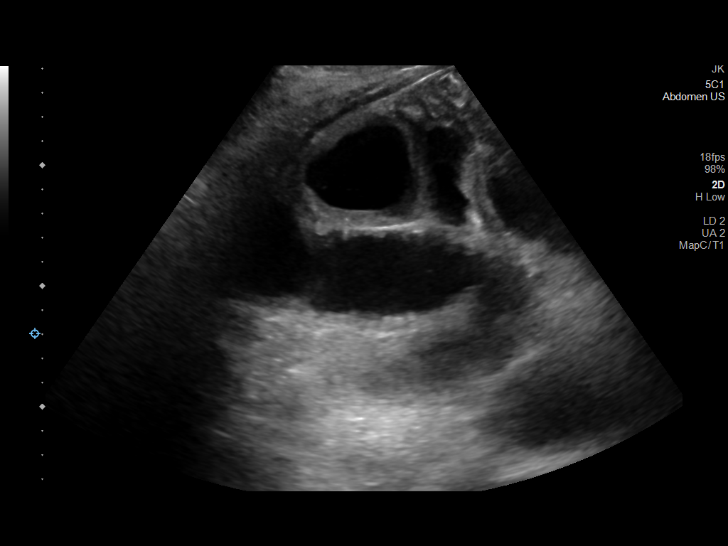
[im 4/19]
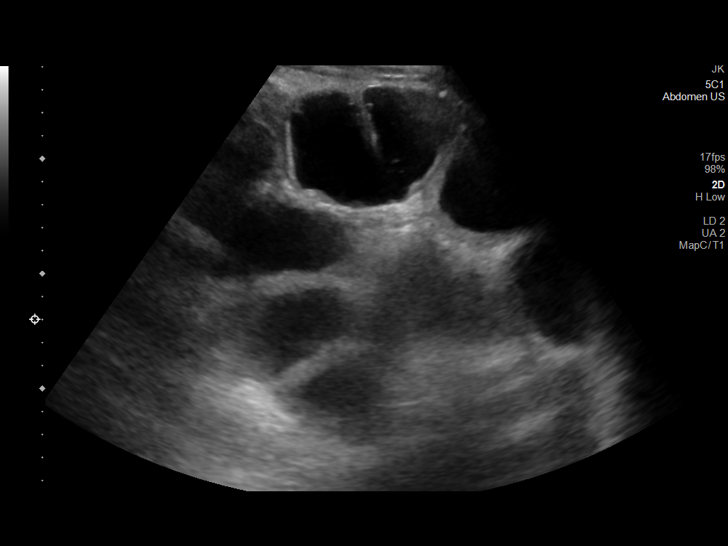
[im 5/19]
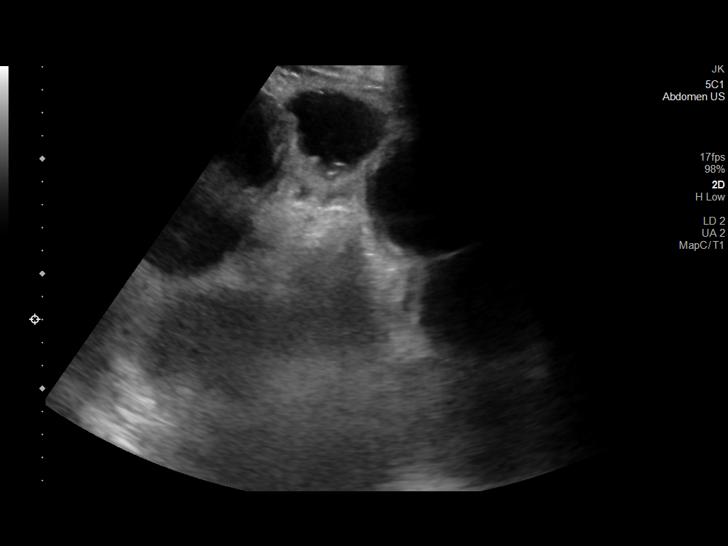
[im 7/19]
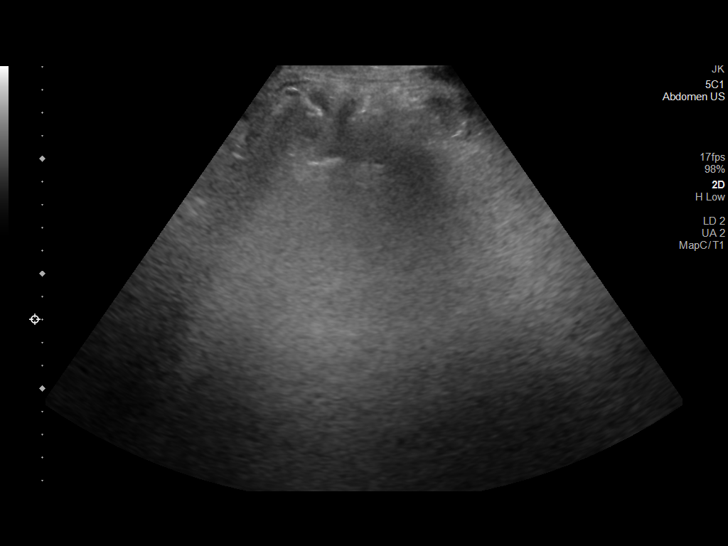
[im 8/19]
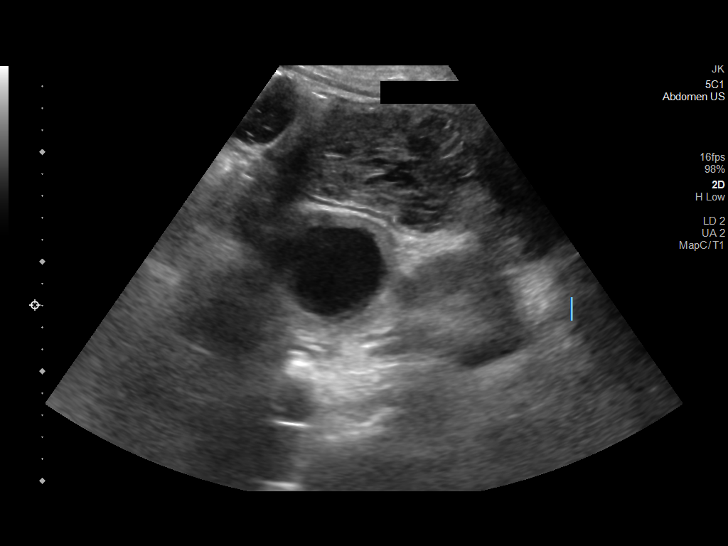
[im 9/19]
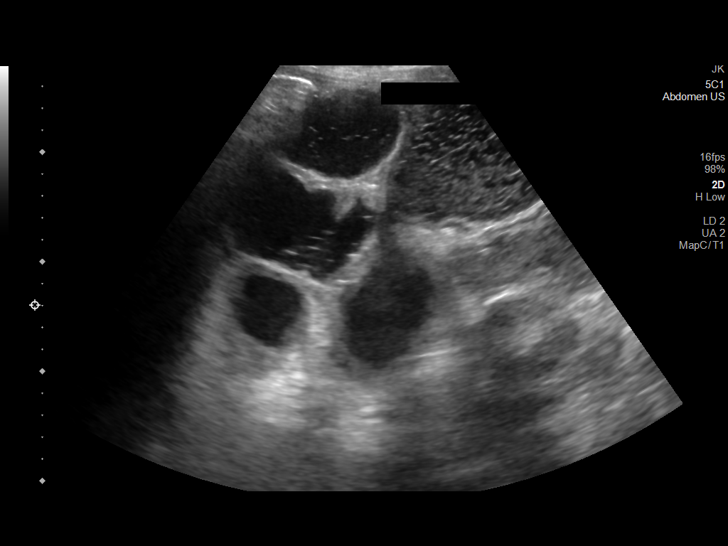
[im 11/19]
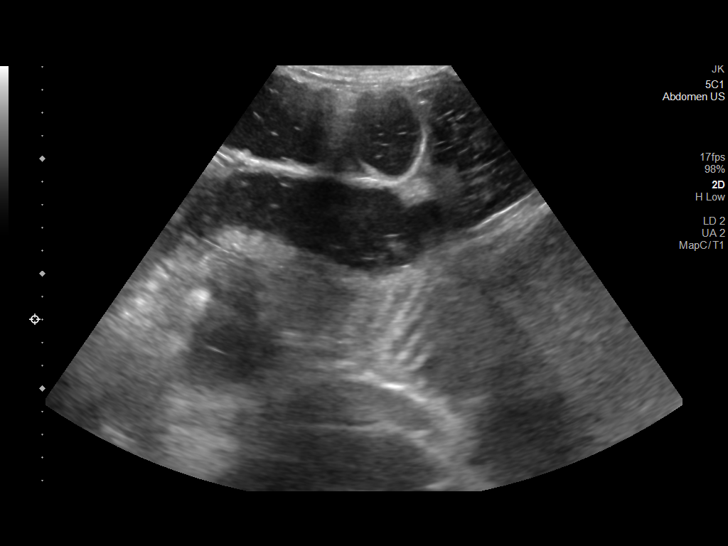
[im 12/19]
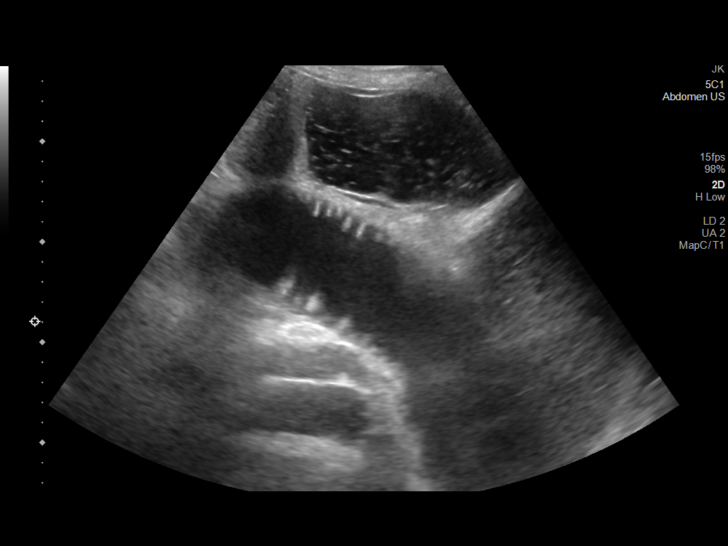
[im 13/19]
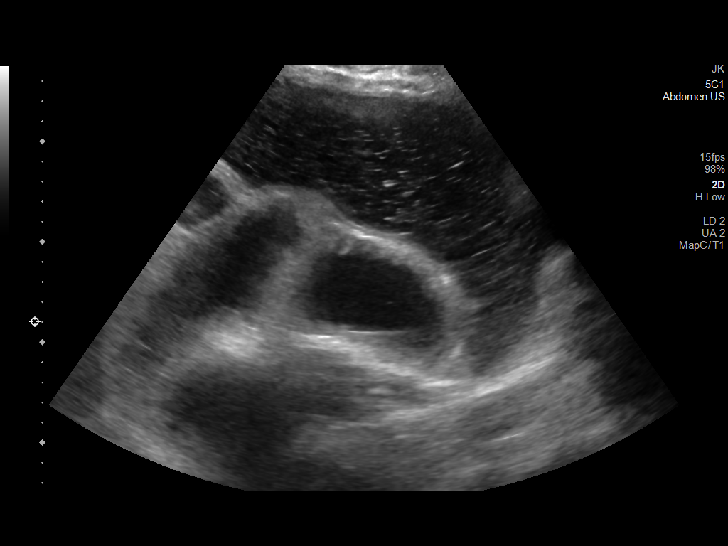
[im 15/19]
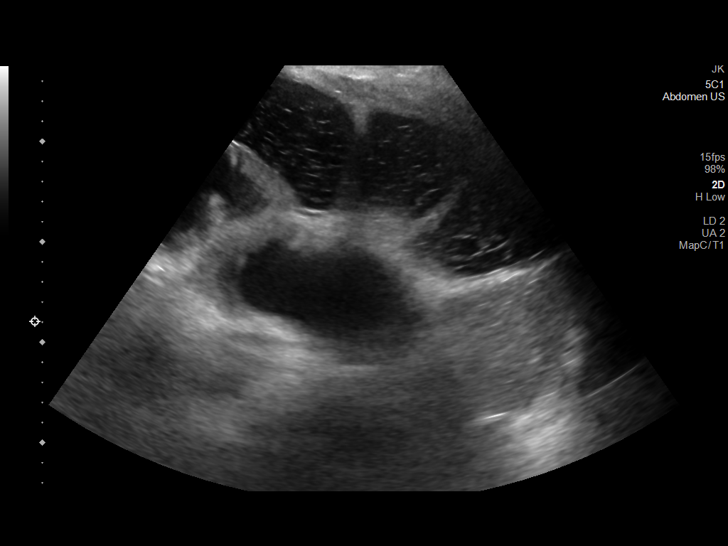
[im 16/19]
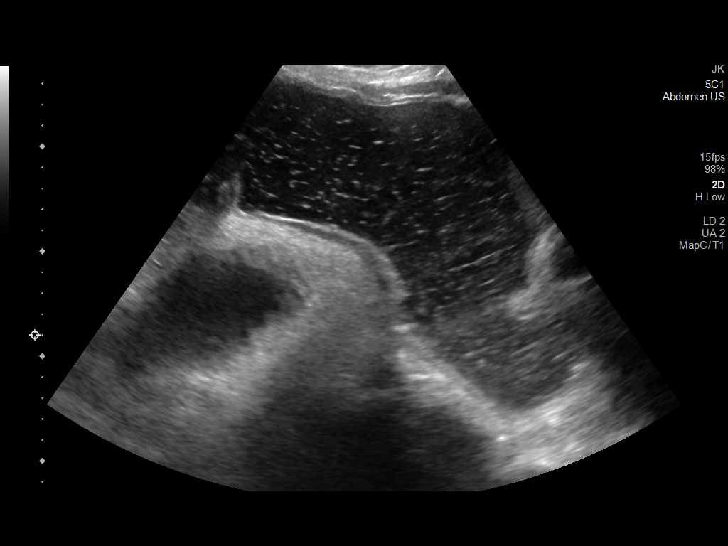
[im 17/19]
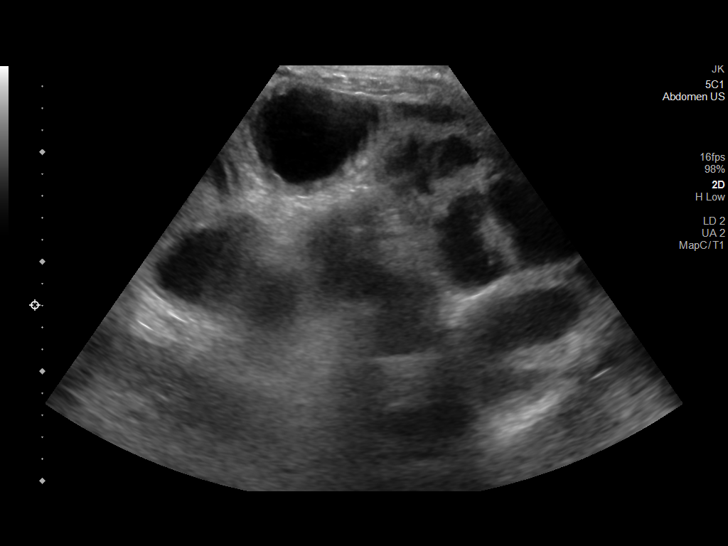
[im 19/19]
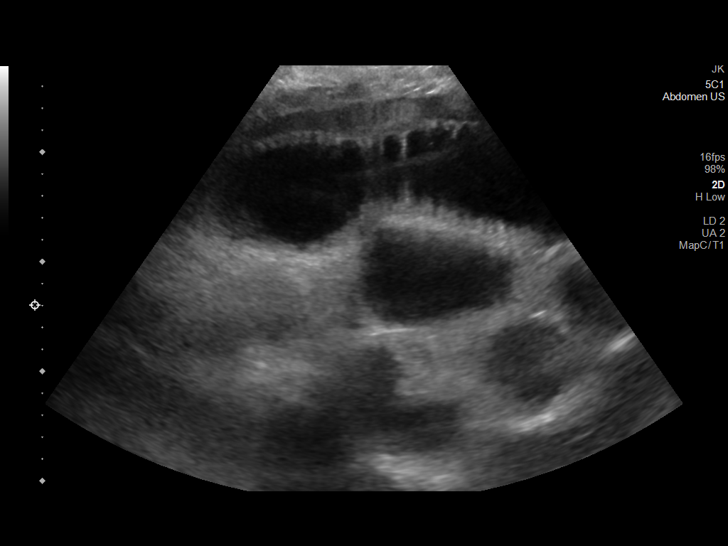

[14 of 19 positions shown; findings below may reference images not displayed]

FINDINGS: There is no significant ascites in the abdomen. There are dilated
loops of small bowel and colon throughout the abdomen, several which
appear to contain significant amounts of fluid.
IMPRESSION: 1. No significant ascites.
2. Dilated loops of small bowel and colon are again noted.

## 2021-10-01 IMAGING — DX DG ABD PORTABLE 1V
1 series · 2 of 2 positions shown · non-contrast
Comparison: Prior radiograph from 01/21/2020

CLINICAL DATA: Evaluate bowel gas pattern/ileus.

EXAM:
PORTABLE ABDOMEN - 1 VIEW

[Series 1: abdomen · 0.14mm/px · 2 of 2 slices shown]
[im 1/2]
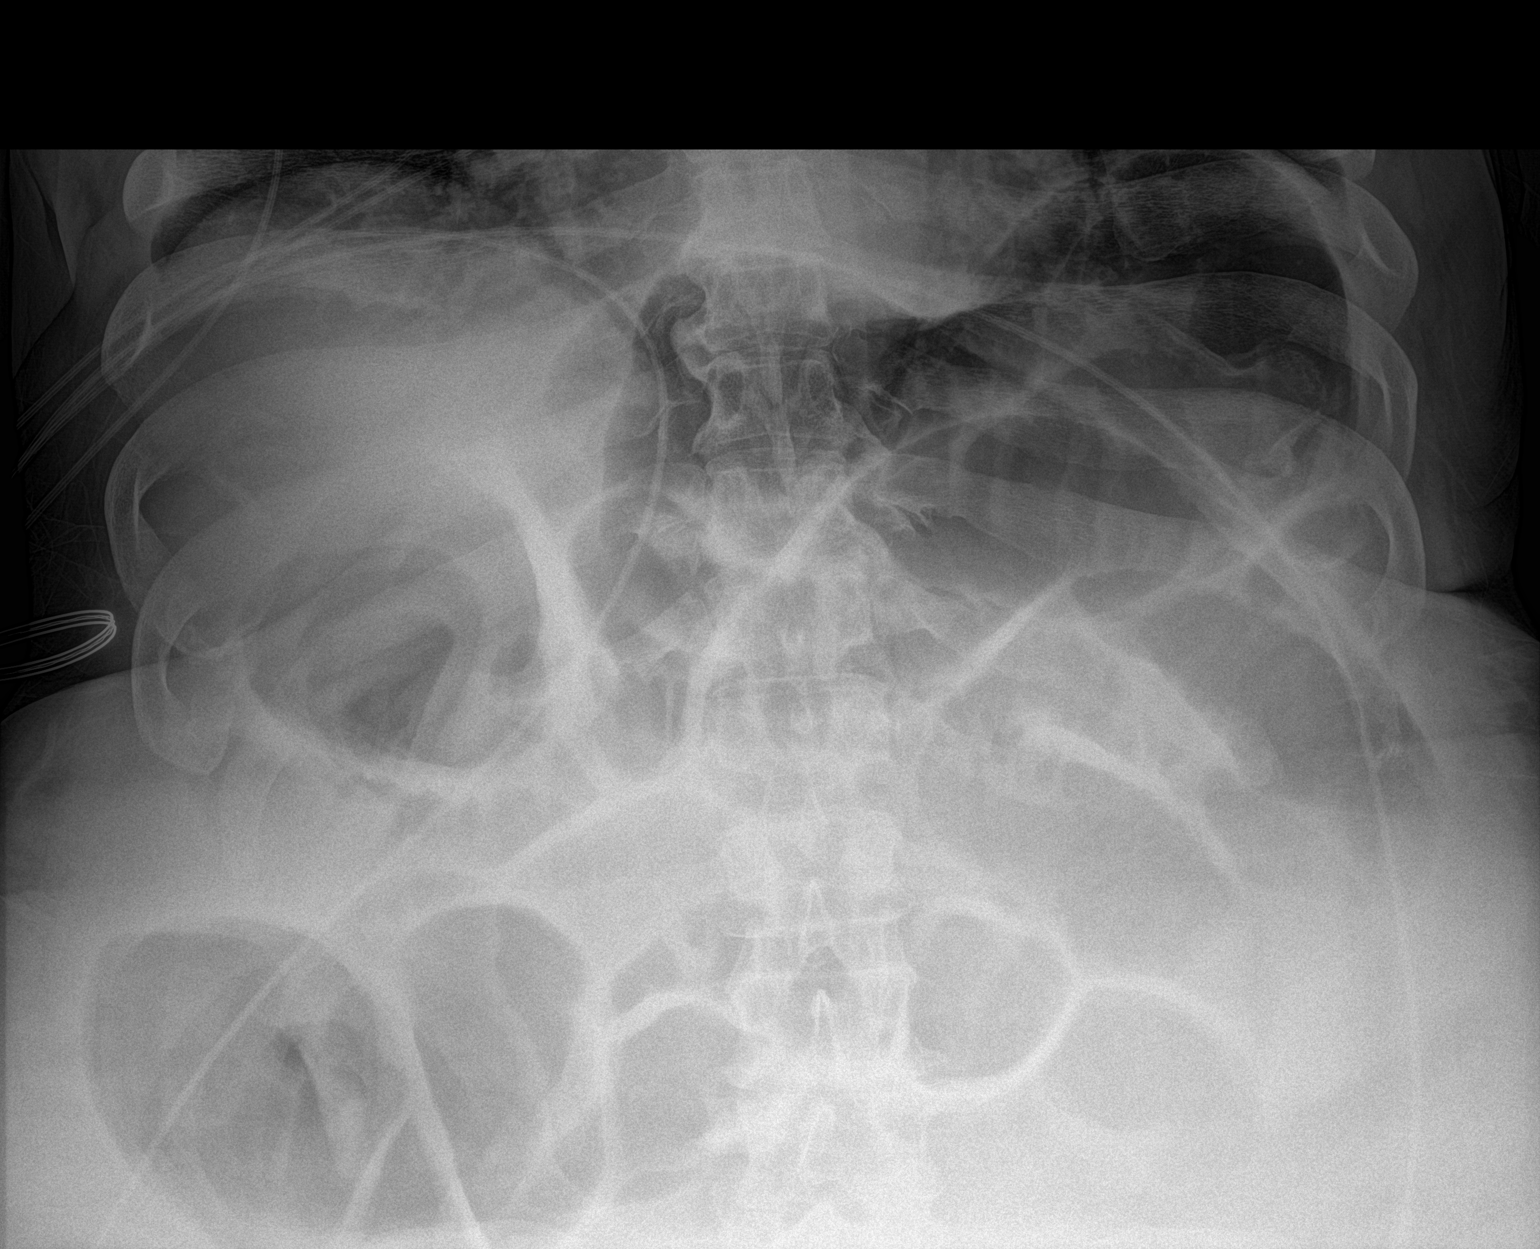
[im 2/2]
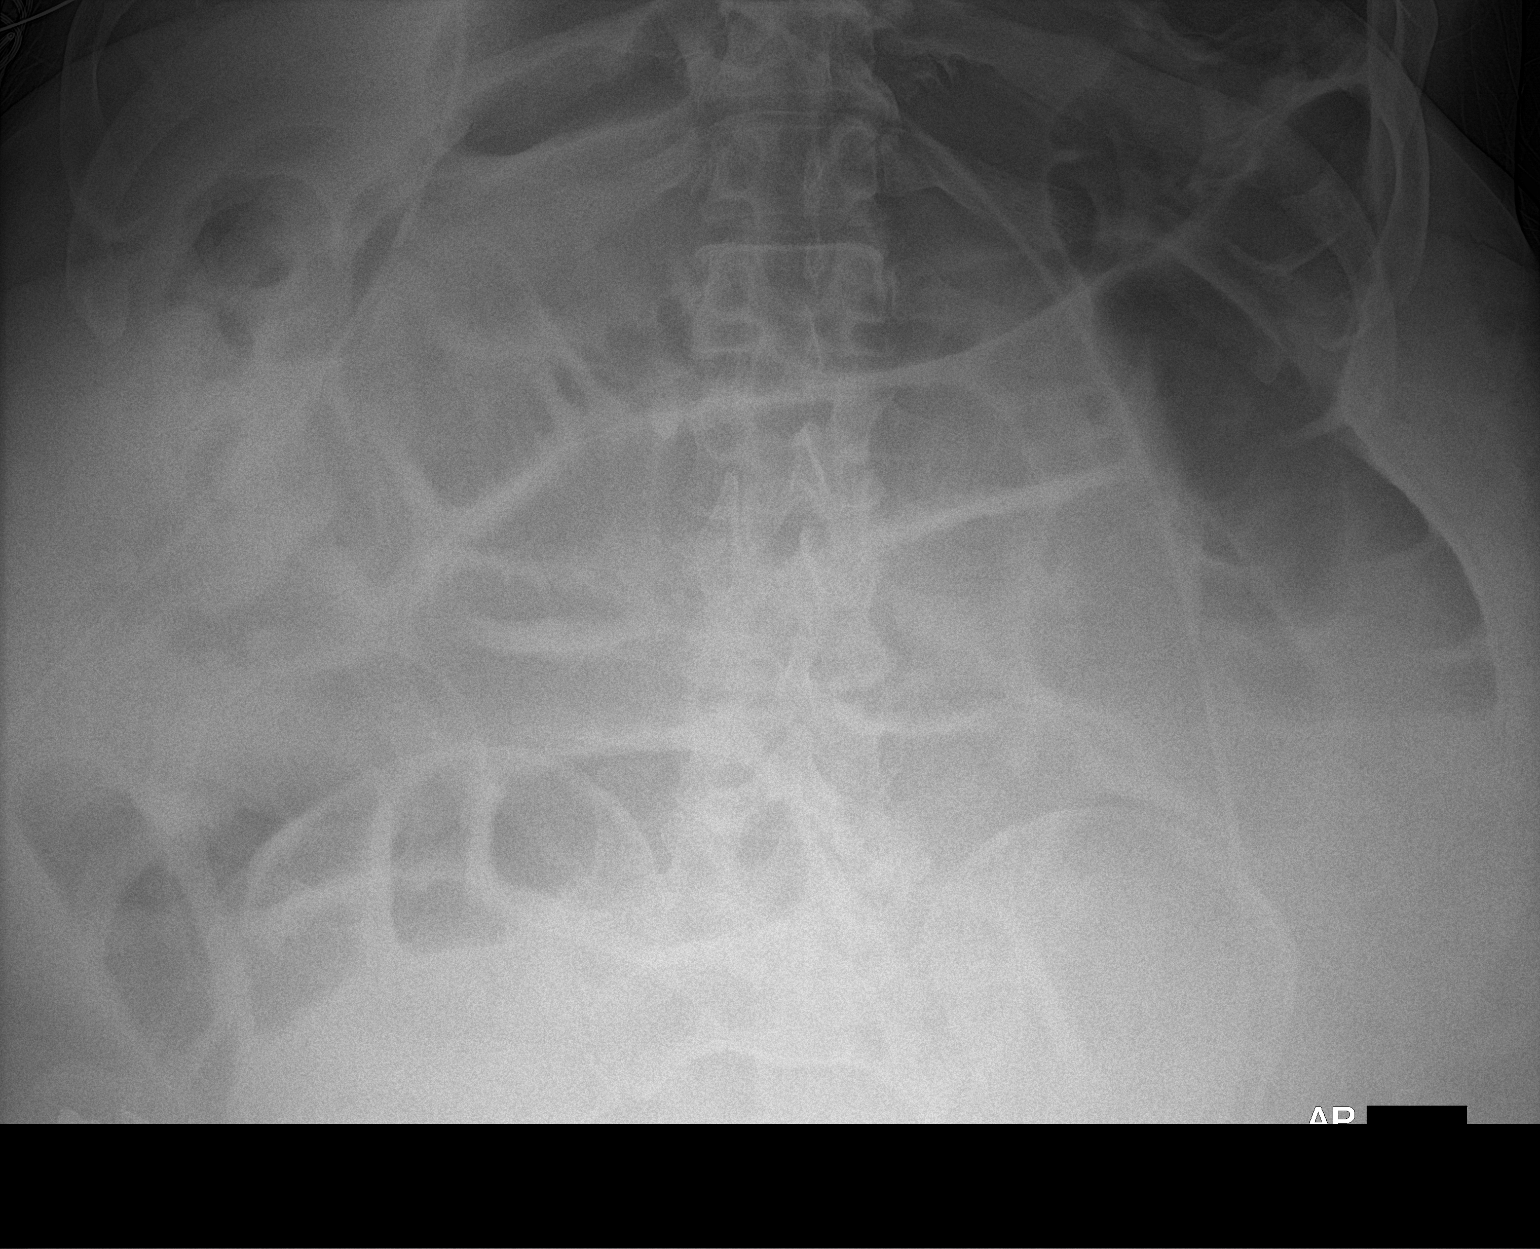

[2 of 2 positions shown; findings below may reference images not displayed]

FINDINGS: Again seen are multiple prominent gas-filled loops of bowel
throughout the abdomen and pelvis, including both gaseous distension
of small and large bowel. Gas lucency overlying the left upper
quadrant felt to reflect gas within the gastric fundus. In
comparison with prior radiograph from 1 day earlier, overall
appearance is perhaps mildly improved, suggesting improving ileus.
No visible pneumoperitoneum or other complication.

Visualized lung bases are clear.

Visualized osseous structures are unchanged.
IMPRESSION: Persistent diffuse gaseous distention of small and large bowel, most
suggestive of ileus. Overall, appearance is perhaps mildly improved
in comparison with prior radiograph from 1 day earlier, suggesting
mild improvement.

## 2021-10-02 IMAGING — DX DG ABDOMEN 1V
1 series · 2 of 2 positions shown · non-contrast
Comparison: January 23, 2020

CLINICAL DATA: Status post NG tube placement.

EXAM:
ABDOMEN - 1 VIEW

[Series 1: abdomen kub · 0.14mm/px · 2 of 2 slices shown]
[im 1/2]
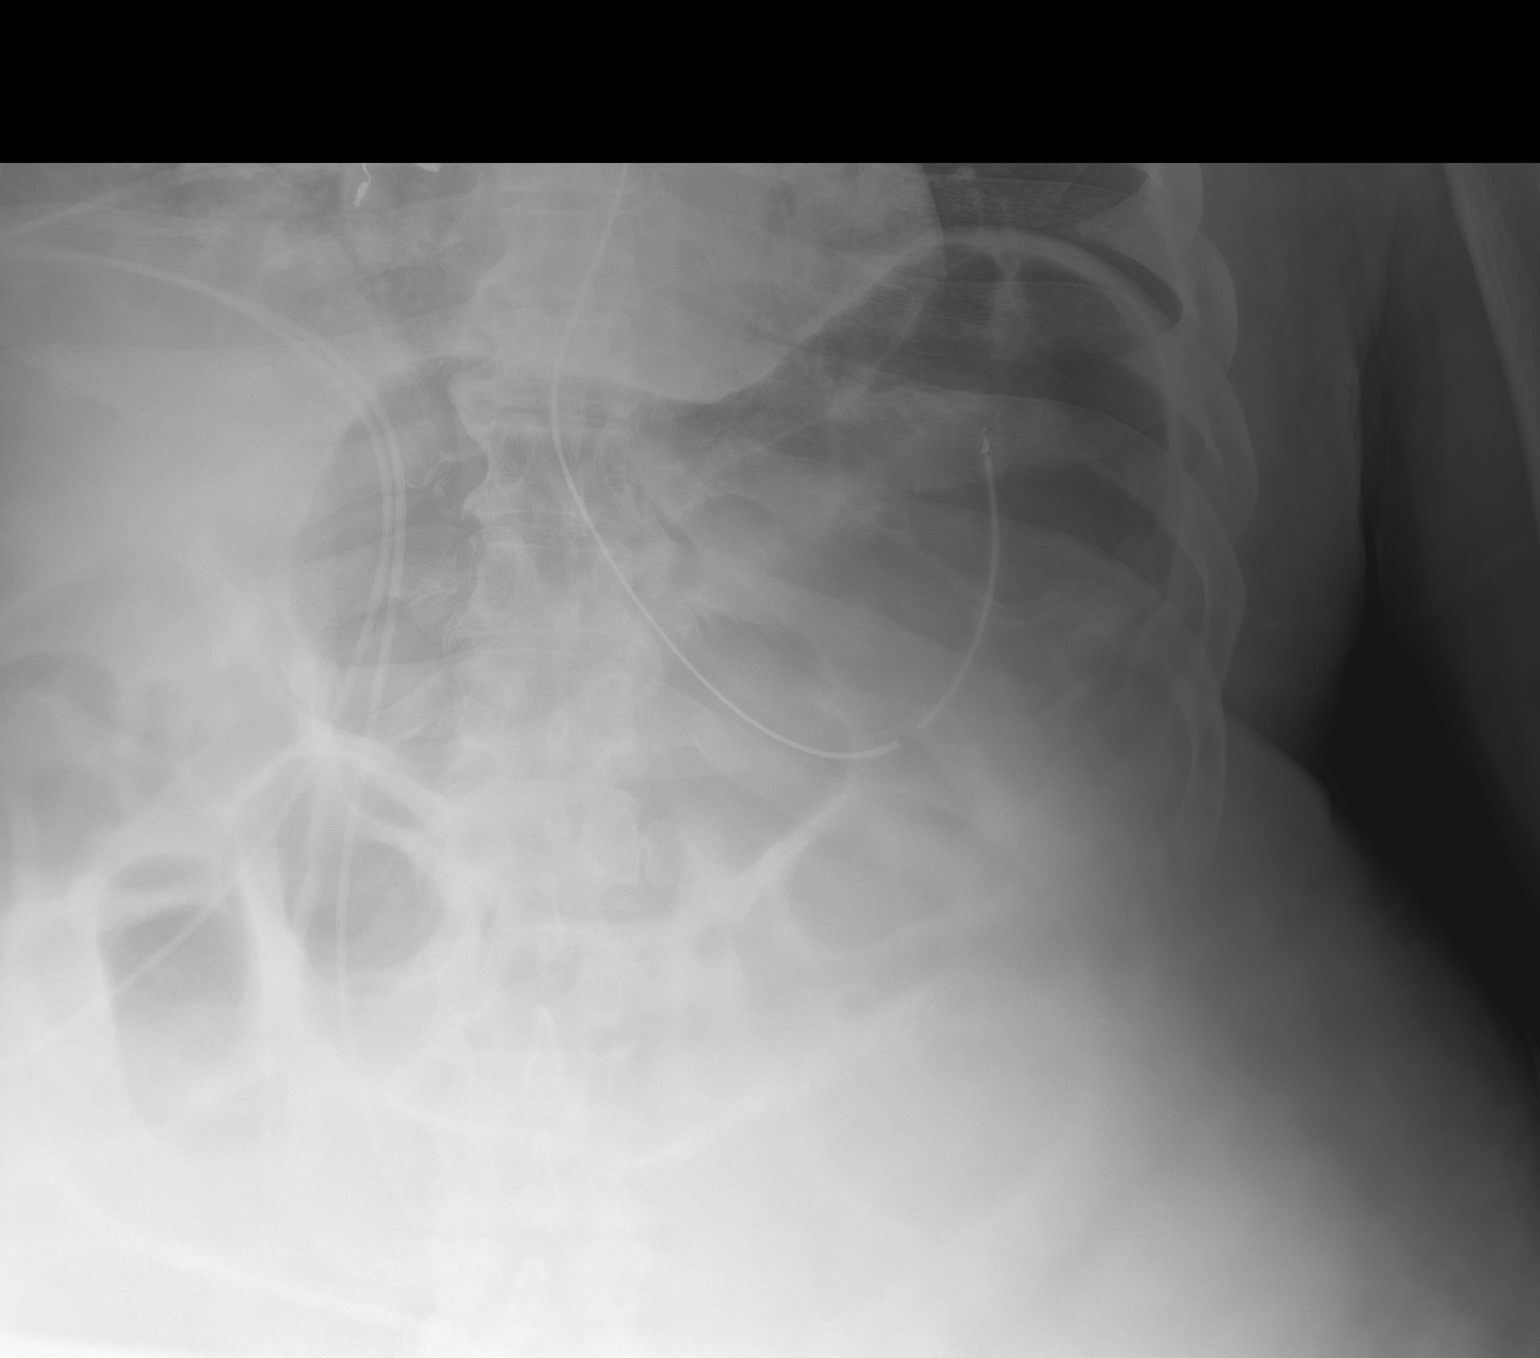
[im 2/2]
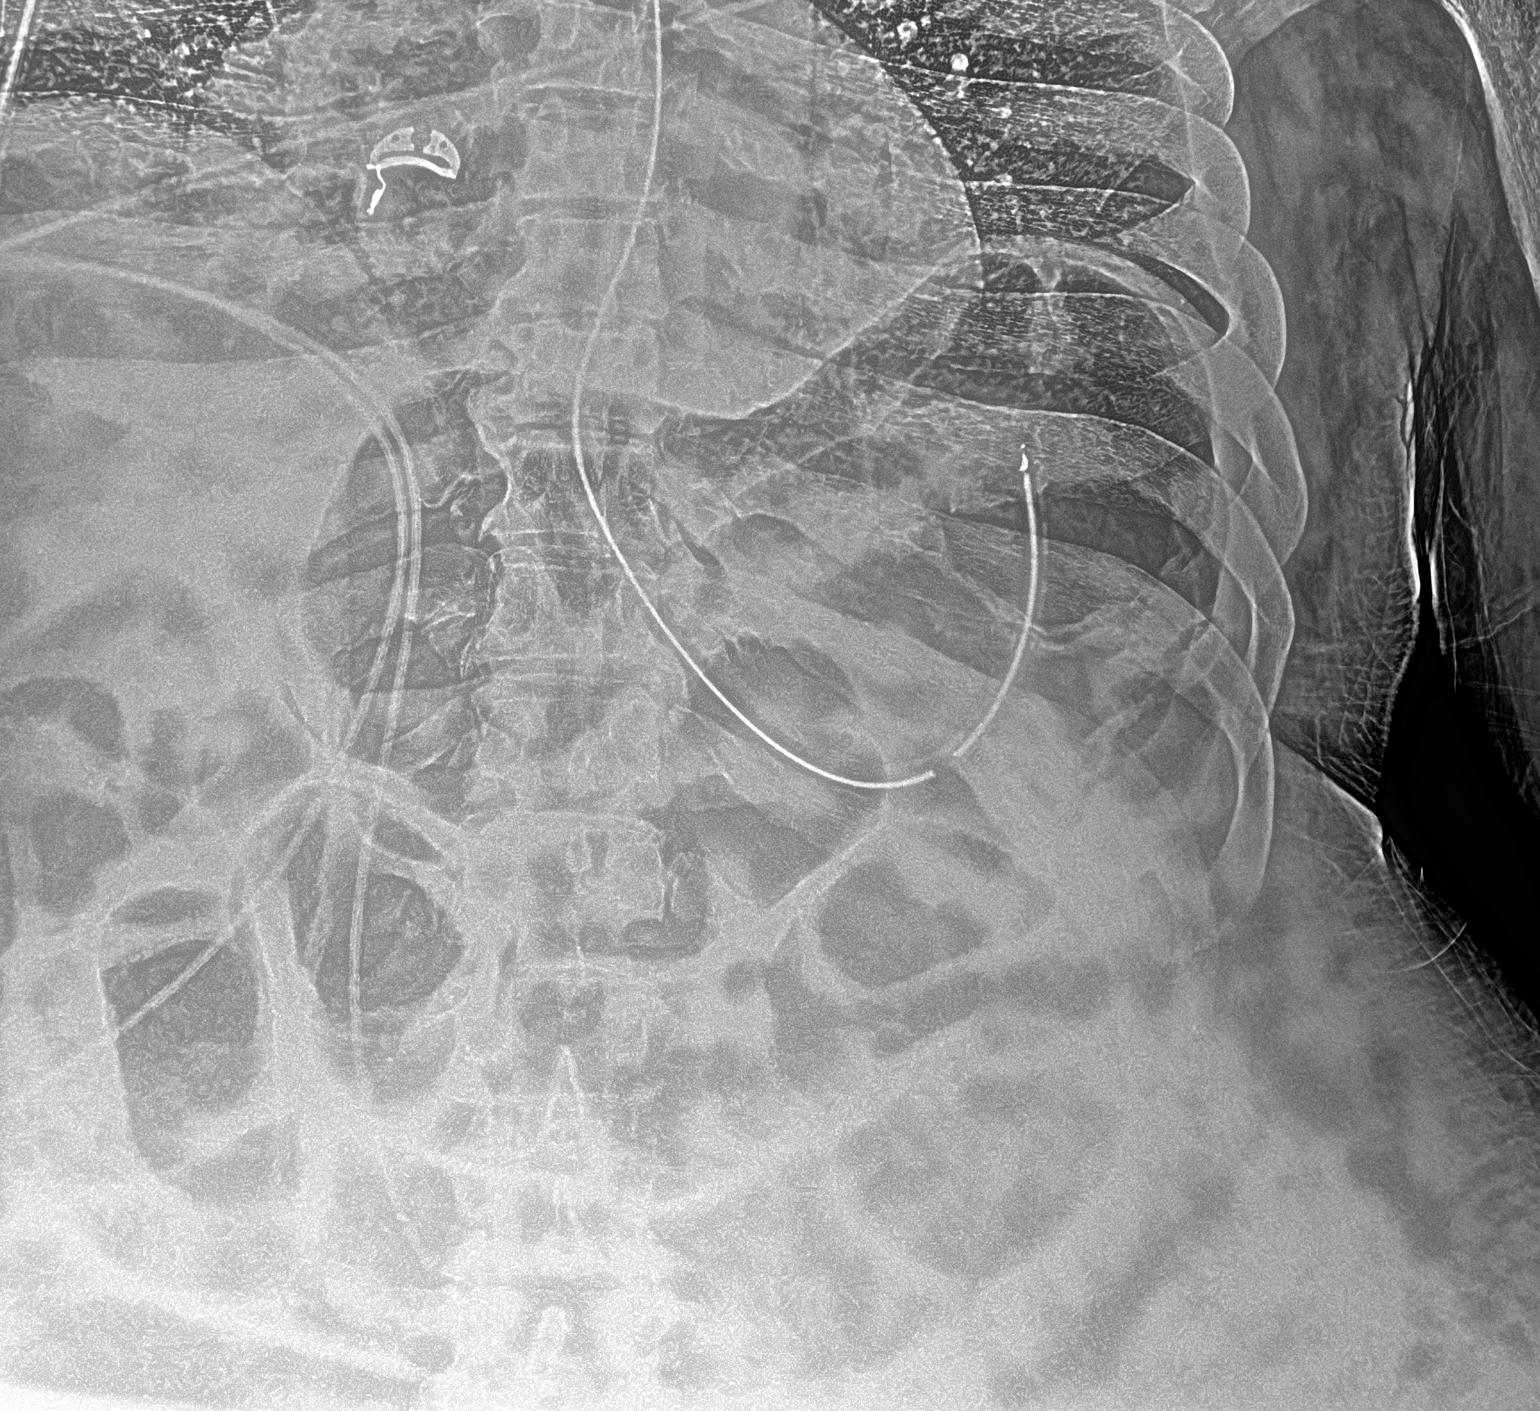

[2 of 2 positions shown; findings below may reference images not displayed]

FINDINGS: There has been an interval placement of enteric catheter with tip
overlying the expected location of gastric cardia.

Gaseous distension of the abdomen, incompletely visualized.
IMPRESSION: 1. Enteric catheter with tip overlying the expected location of
gastric cardia.
2. Gaseous distension of the abdomen, incompletely visualized.

## 2021-10-02 IMAGING — DX DG ABDOMEN 1V
1 series · 1 of 1 positions shown · non-contrast
Comparison: 01/22/2020

CLINICAL DATA: Ileus

EXAM:
ABDOMEN - 1 VIEW

[abdomen]
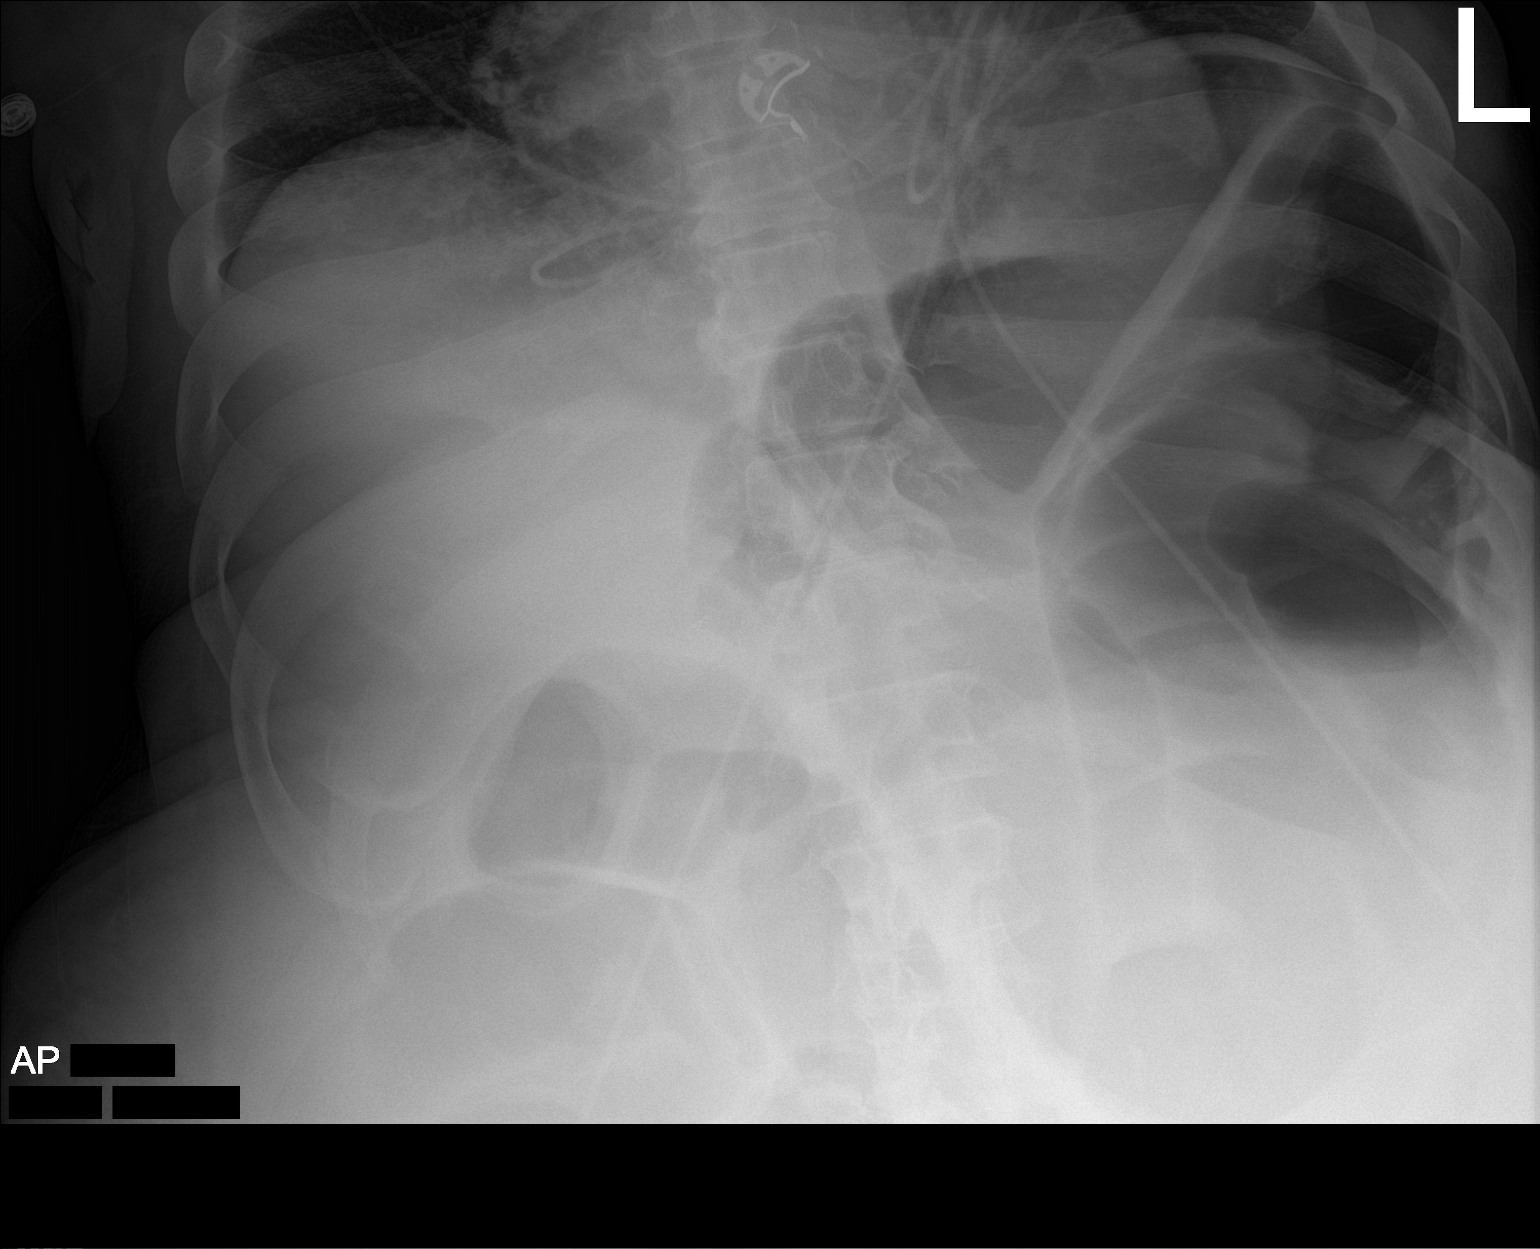

[1 of 1 positions shown; findings below may reference images not displayed]

FINDINGS: Limited portable semi upright view of the abdomen was obtained. The
entirety of the abdomen was not included within the field of view
secondary to habitus. There is persistent gaseous distension of both
large and small bowel loops within the visualized abdomen. Overall,
little interval change from the previous study. No gross free
intraperitoneal air.
IMPRESSION: Persistent gaseous distension of both large and small bowel loops,
not significantly changed from the previous study.

## 2021-10-03 DIAGNOSIS — I10 Essential (primary) hypertension: Secondary | ICD-10-CM | POA: Diagnosis not present

## 2021-10-03 DIAGNOSIS — K5981 Ogilvie syndrome: Secondary | ICD-10-CM | POA: Diagnosis not present

## 2021-10-03 DIAGNOSIS — M6281 Muscle weakness (generalized): Secondary | ICD-10-CM | POA: Diagnosis not present

## 2021-10-03 DIAGNOSIS — Z89512 Acquired absence of left leg below knee: Secondary | ICD-10-CM | POA: Diagnosis not present

## 2021-10-03 DIAGNOSIS — N1831 Chronic kidney disease, stage 3a: Secondary | ICD-10-CM | POA: Diagnosis not present

## 2021-10-03 IMAGING — DX DG ABD PORTABLE 1V
1 series · 3 of 3 positions shown · non-contrast
Comparison: 01/23/2020

CLINICAL DATA: Follow-up ileus.

EXAM:
PORTABLE ABDOMEN - 1 VIEW

[Series 1: abdomen · 0.14mm/px · 3 of 3 slices shown]
[im 1/3]
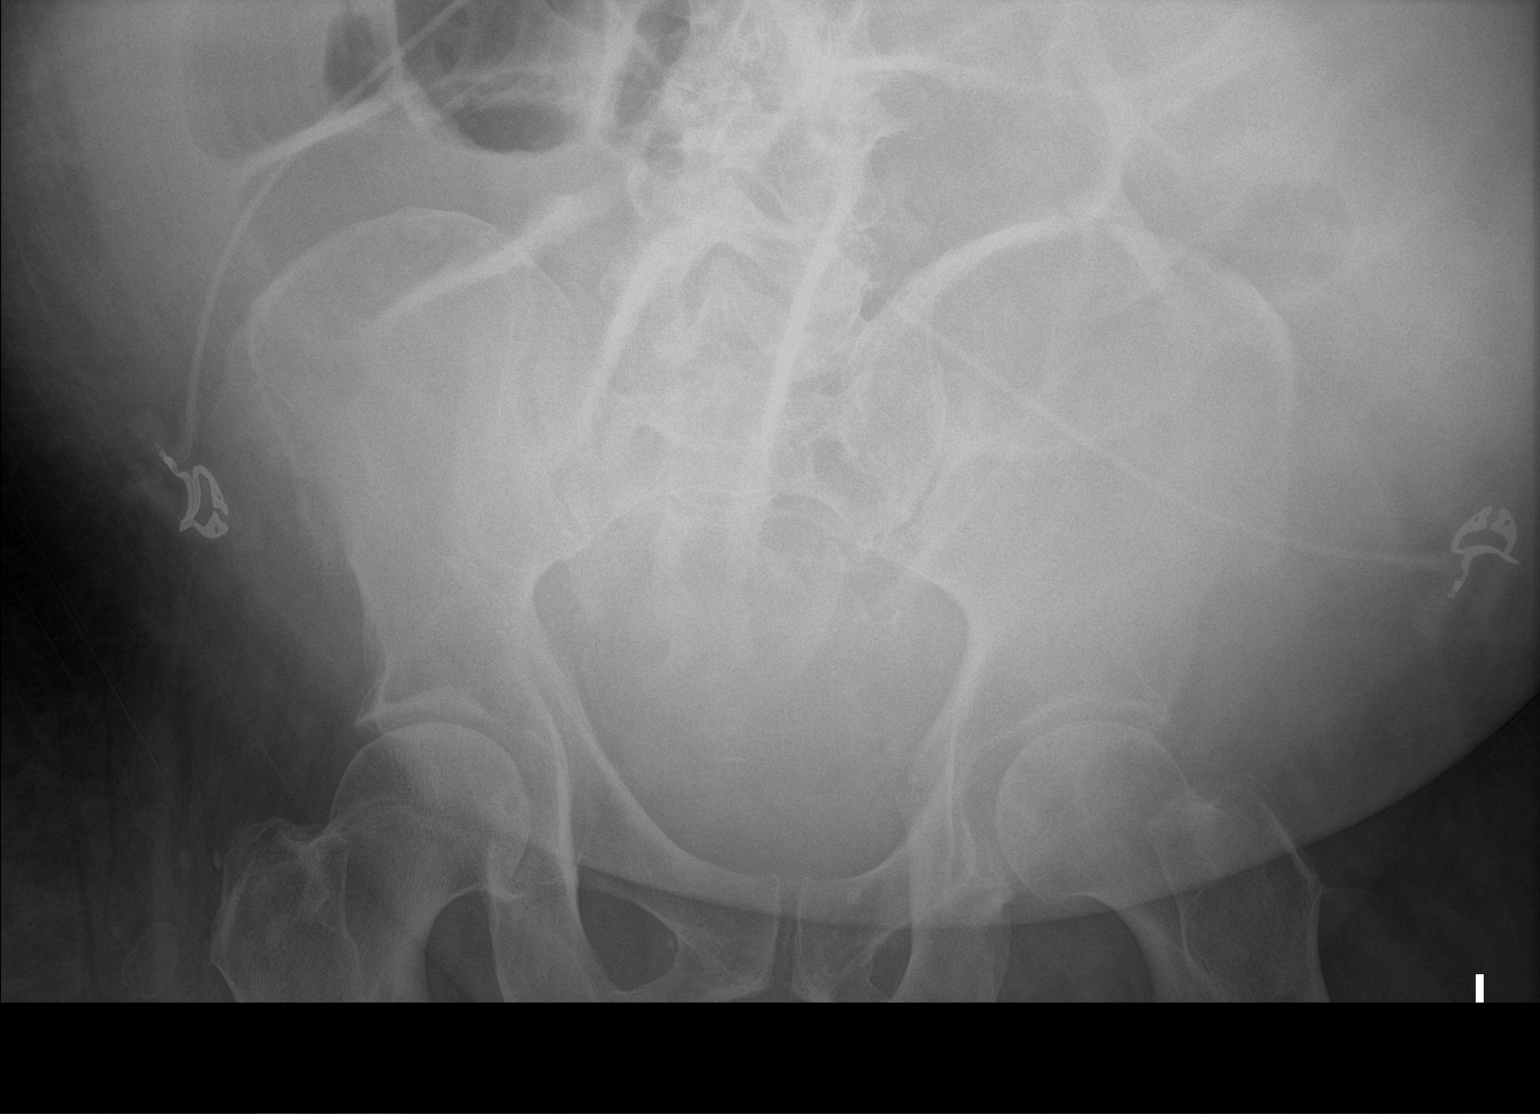
[im 2/3]
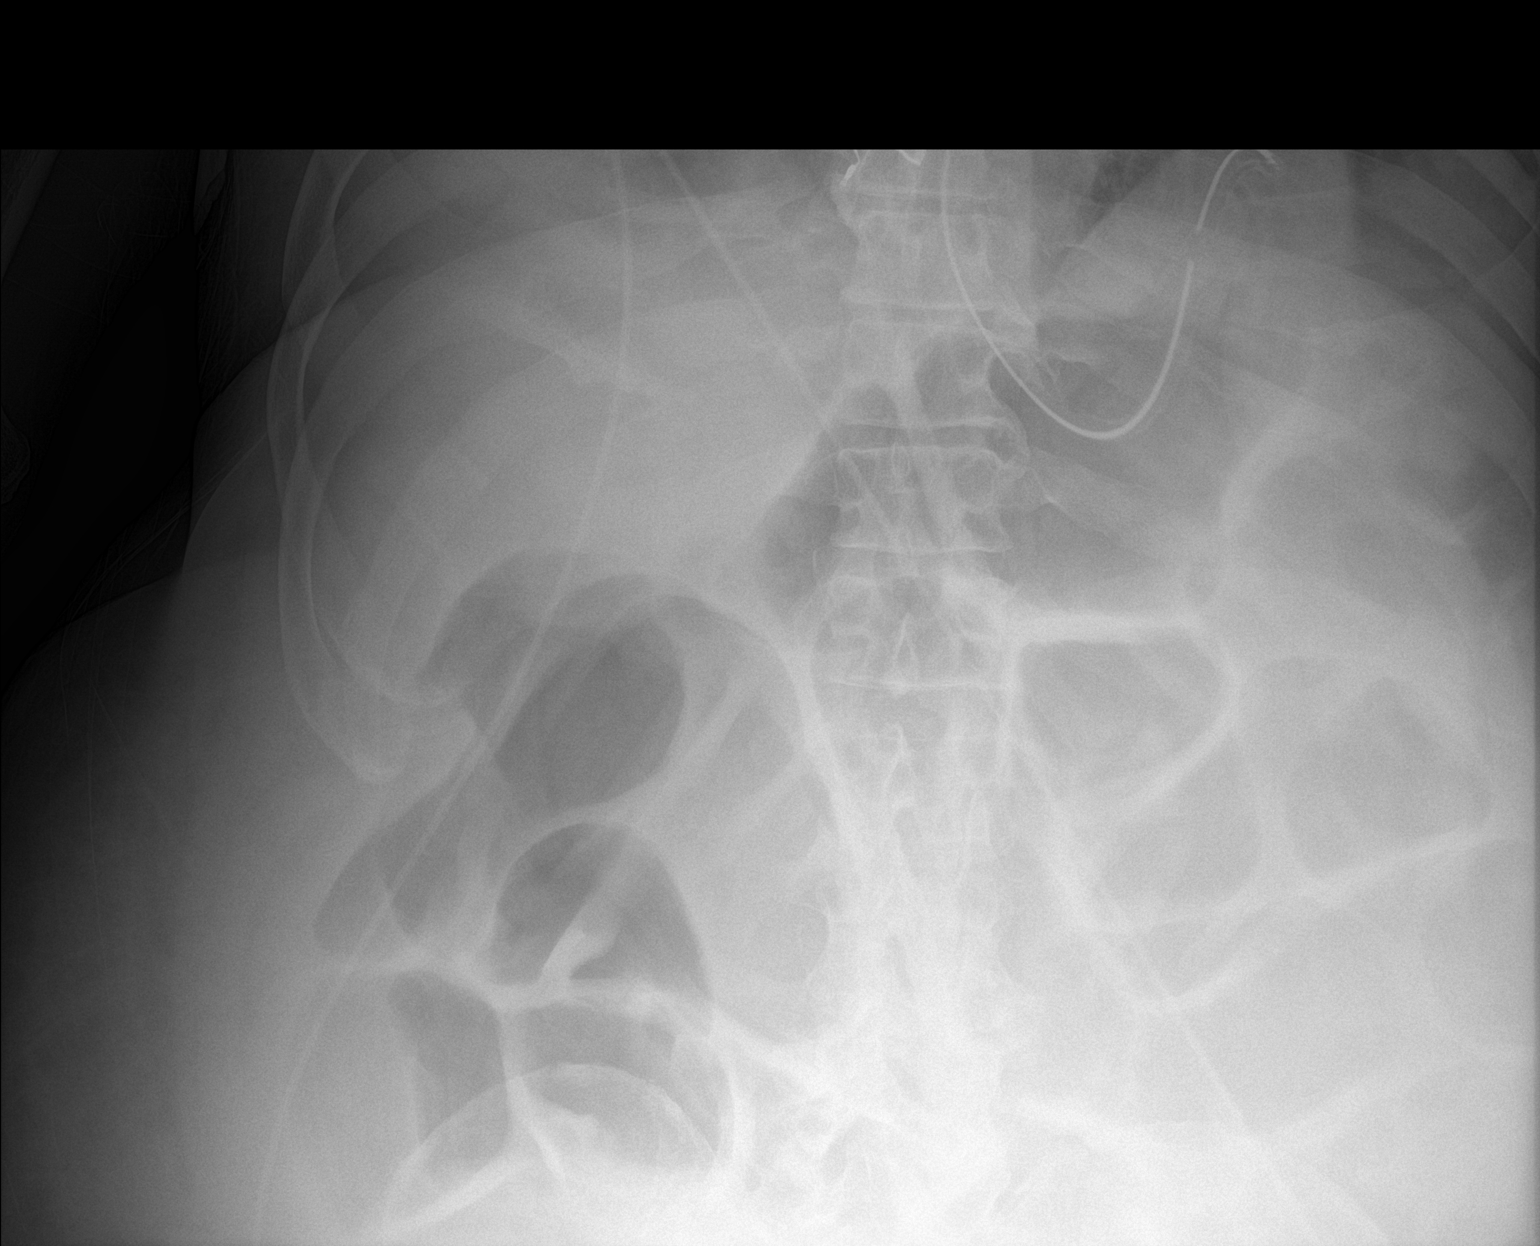
[im 3/3]
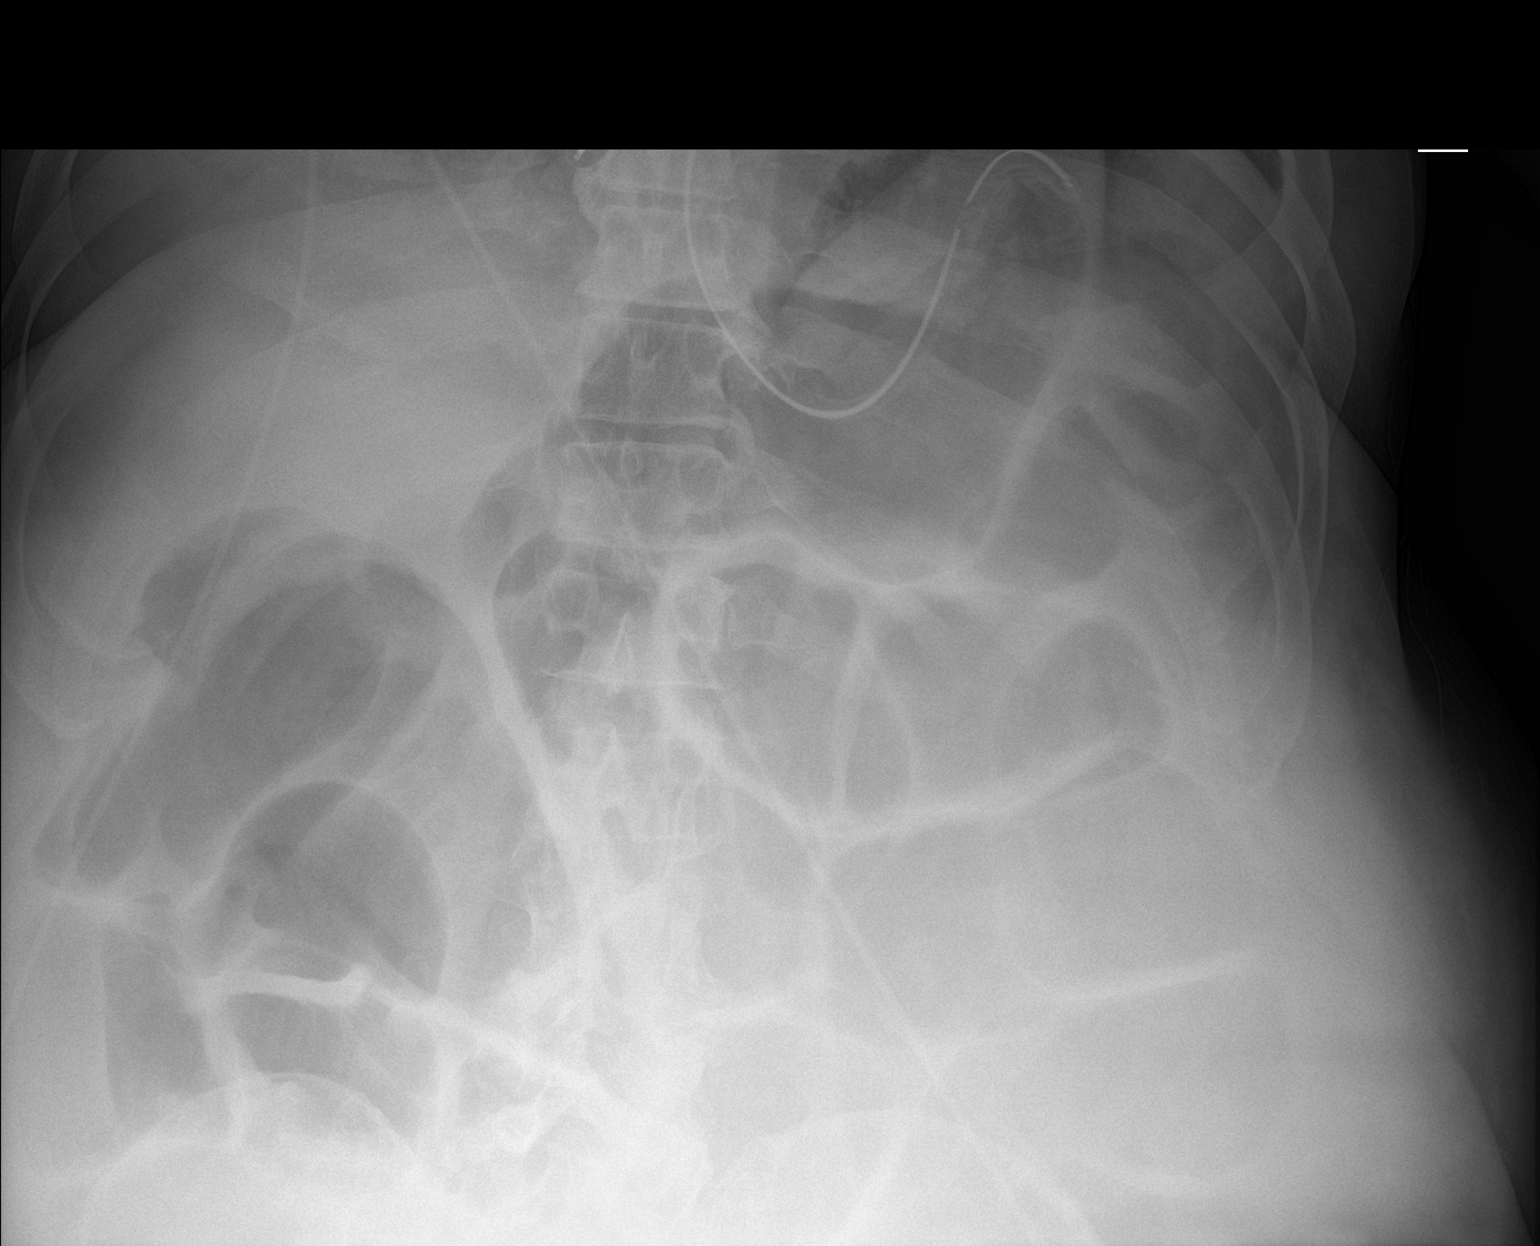

[3 of 3 positions shown; findings below may reference images not displayed]

FINDINGS: Nasogastric tube remains within the stomach. Diffuse gaseous
distention of colon shows no significant change, most consistent
with colonic ileus.
IMPRESSION: Colonic ileus pattern, without significant change.

## 2021-10-04 DIAGNOSIS — K5981 Ogilvie syndrome: Secondary | ICD-10-CM | POA: Diagnosis not present

## 2021-10-04 DIAGNOSIS — N1831 Chronic kidney disease, stage 3a: Secondary | ICD-10-CM | POA: Diagnosis not present

## 2021-10-04 DIAGNOSIS — M6281 Muscle weakness (generalized): Secondary | ICD-10-CM | POA: Diagnosis not present

## 2021-10-04 DIAGNOSIS — Z89512 Acquired absence of left leg below knee: Secondary | ICD-10-CM | POA: Diagnosis not present

## 2021-10-04 DIAGNOSIS — E1151 Type 2 diabetes mellitus with diabetic peripheral angiopathy without gangrene: Secondary | ICD-10-CM | POA: Diagnosis not present

## 2021-10-04 DIAGNOSIS — B351 Tinea unguium: Secondary | ICD-10-CM | POA: Diagnosis not present

## 2021-10-04 DIAGNOSIS — I10 Essential (primary) hypertension: Secondary | ICD-10-CM | POA: Diagnosis not present

## 2021-10-04 DIAGNOSIS — Z794 Long term (current) use of insulin: Secondary | ICD-10-CM | POA: Diagnosis not present

## 2021-10-05 DIAGNOSIS — I10 Essential (primary) hypertension: Secondary | ICD-10-CM | POA: Diagnosis not present

## 2021-10-05 DIAGNOSIS — K5981 Ogilvie syndrome: Secondary | ICD-10-CM | POA: Diagnosis not present

## 2021-10-05 DIAGNOSIS — Z89512 Acquired absence of left leg below knee: Secondary | ICD-10-CM | POA: Diagnosis not present

## 2021-10-05 DIAGNOSIS — E118 Type 2 diabetes mellitus with unspecified complications: Secondary | ICD-10-CM | POA: Diagnosis not present

## 2021-10-05 DIAGNOSIS — N1831 Chronic kidney disease, stage 3a: Secondary | ICD-10-CM | POA: Diagnosis not present

## 2021-10-05 DIAGNOSIS — M6281 Muscle weakness (generalized): Secondary | ICD-10-CM | POA: Diagnosis not present

## 2021-10-05 IMAGING — DX DG ABDOMEN 1V
2 series · 2 of 2 positions shown · non-contrast
Comparison: January 24, 2020

CLINICAL DATA: Ileus

EXAM:
ABDOMEN - 1 VIEW

[abdomen kub (1 of 2)]
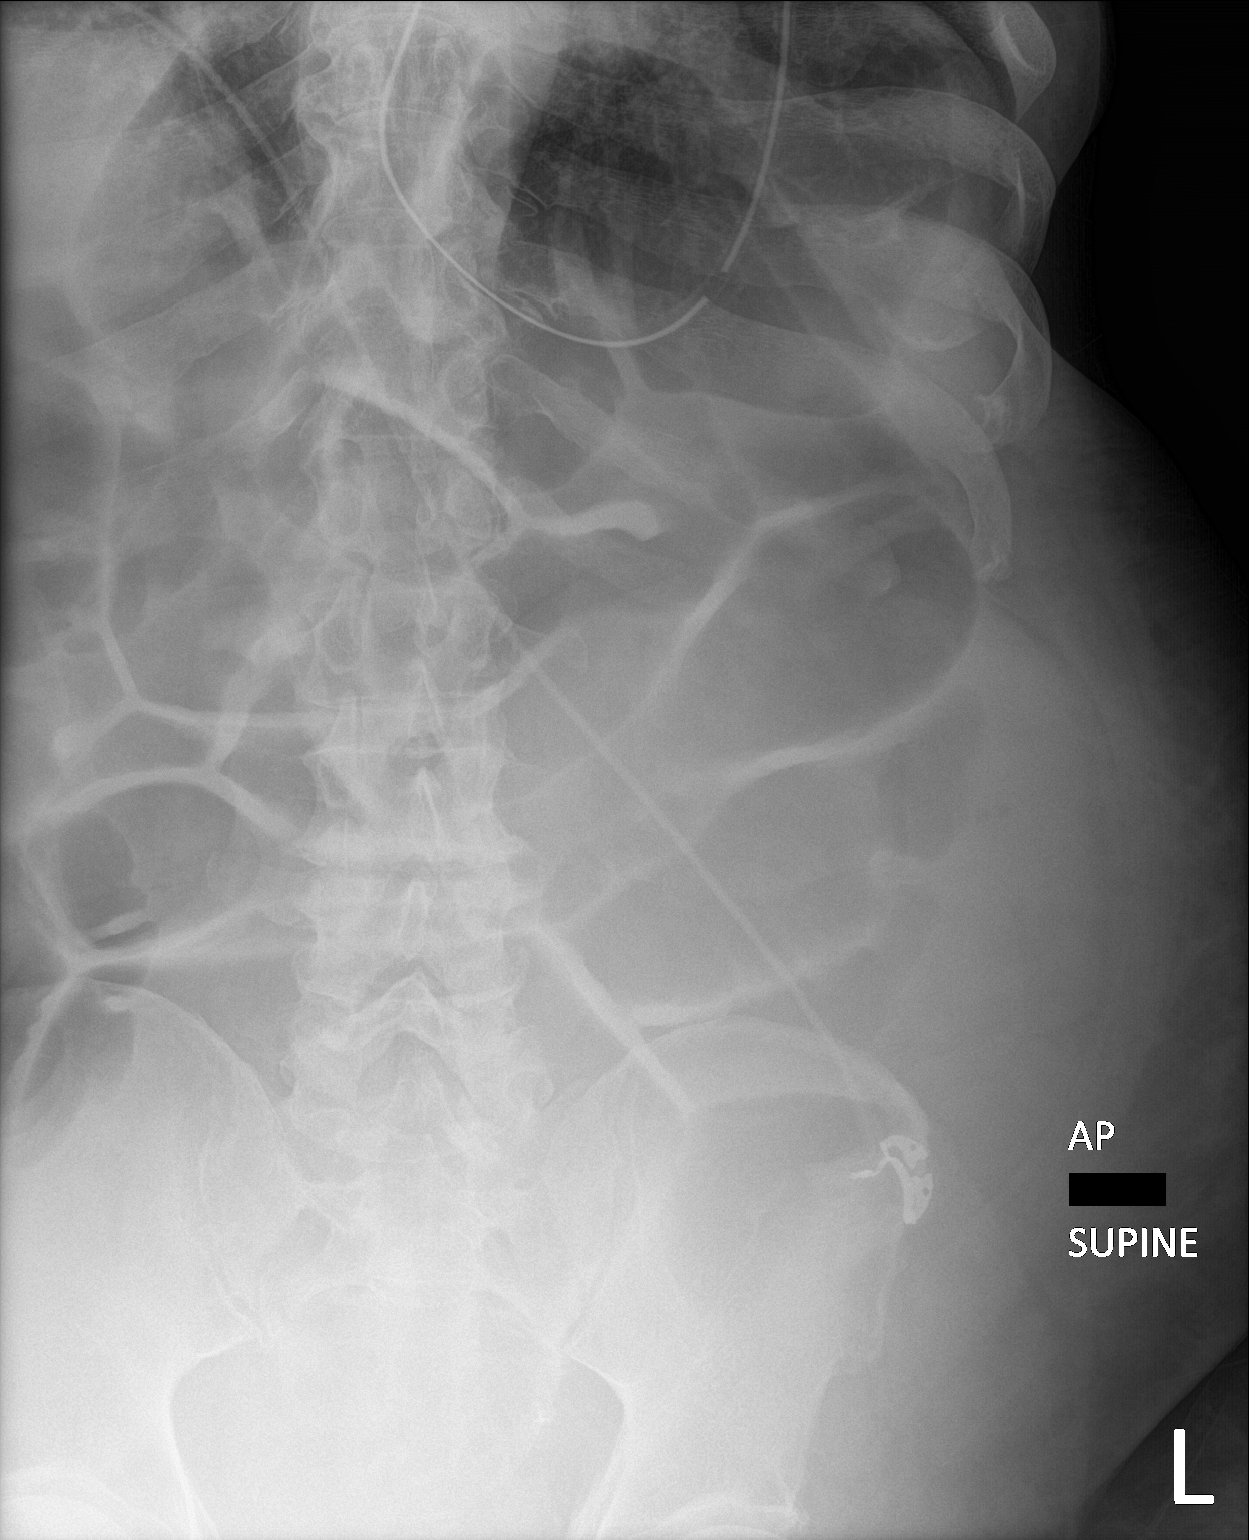

[abdomen kub (2 of 2)]
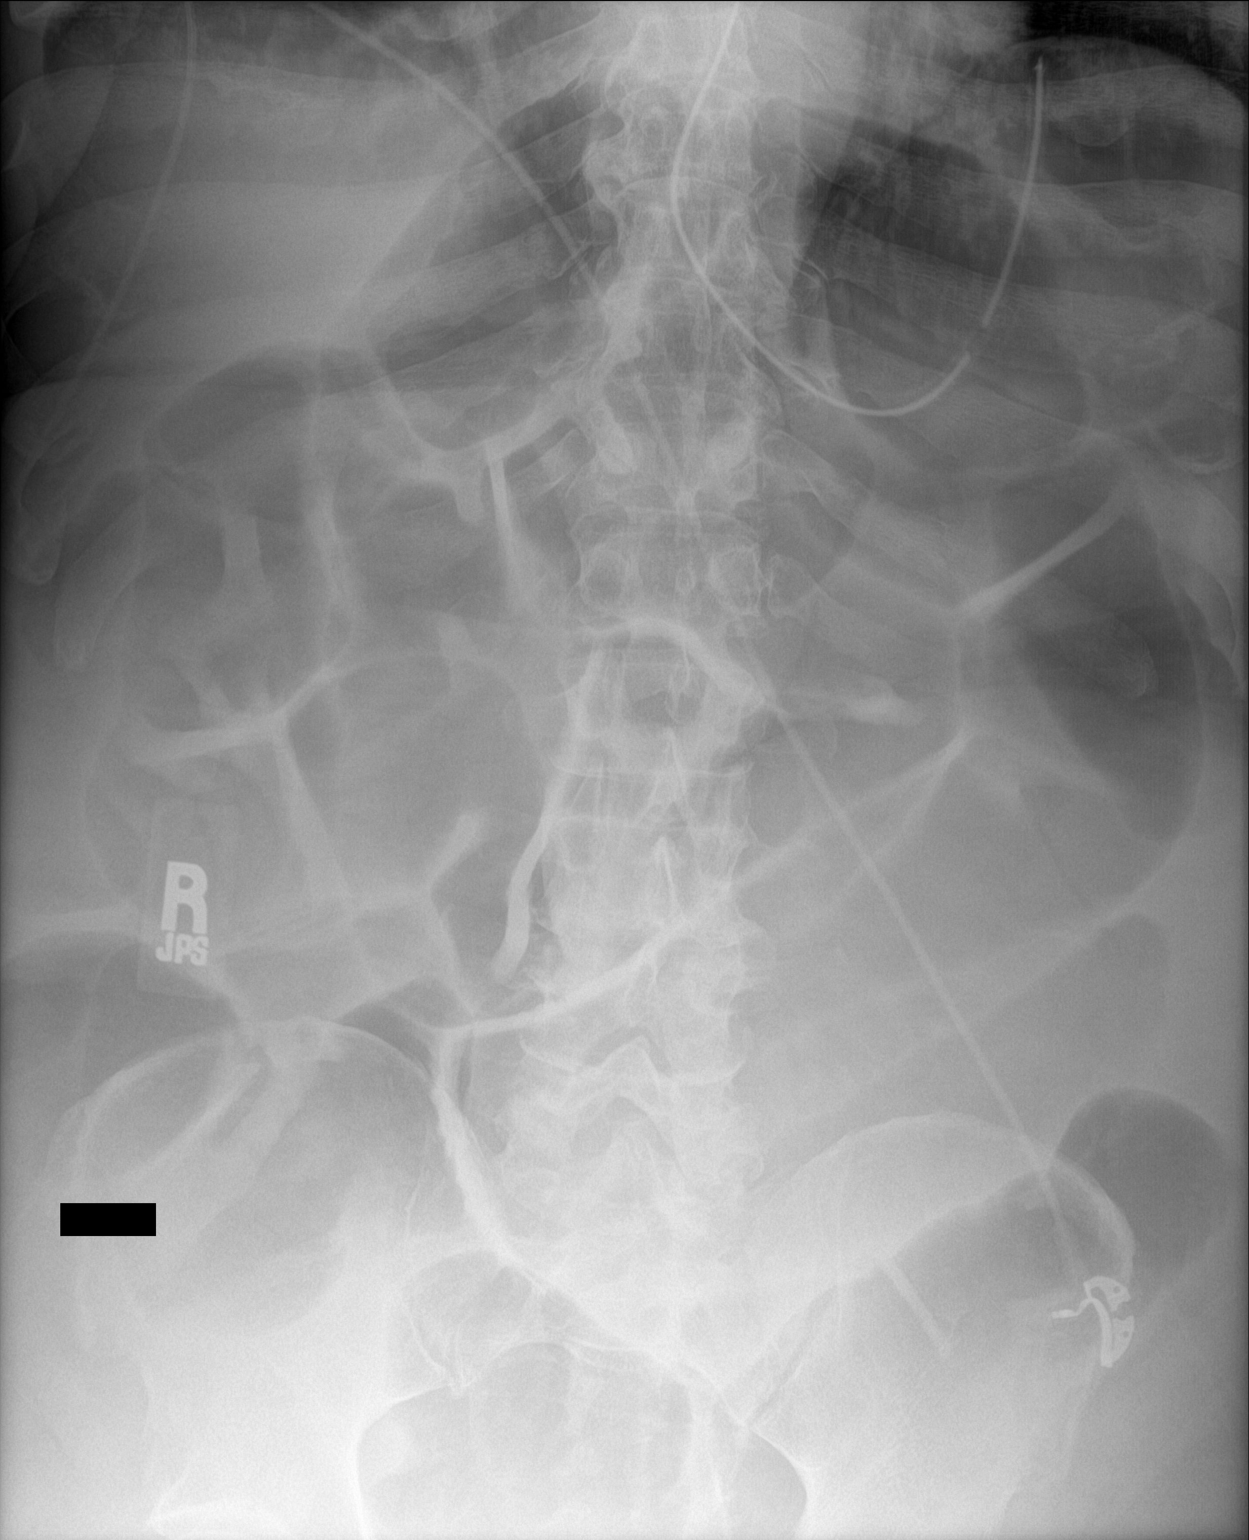

[2 of 2 positions shown; findings below may reference images not displayed]

FINDINGS: Nasogastric tube tip and side port are in the stomach. There is
persistent bowel dilatation without air-fluid levels. No free air
evident. Visualized left lung base clear.
IMPRESSION: Persistent bowel dilatation, likely ileus. No free air evident.
Nasogastric tube tip and side port in stomach.

## 2021-10-06 IMAGING — DX DG ABDOMEN 1V
2 series · 2 of 2 positions shown · non-contrast
Comparison: January 26, 2020.

CLINICAL DATA: Ileus.

EXAM:
ABDOMEN - 1 VIEW

[x abdomen supine (1 of 2)]
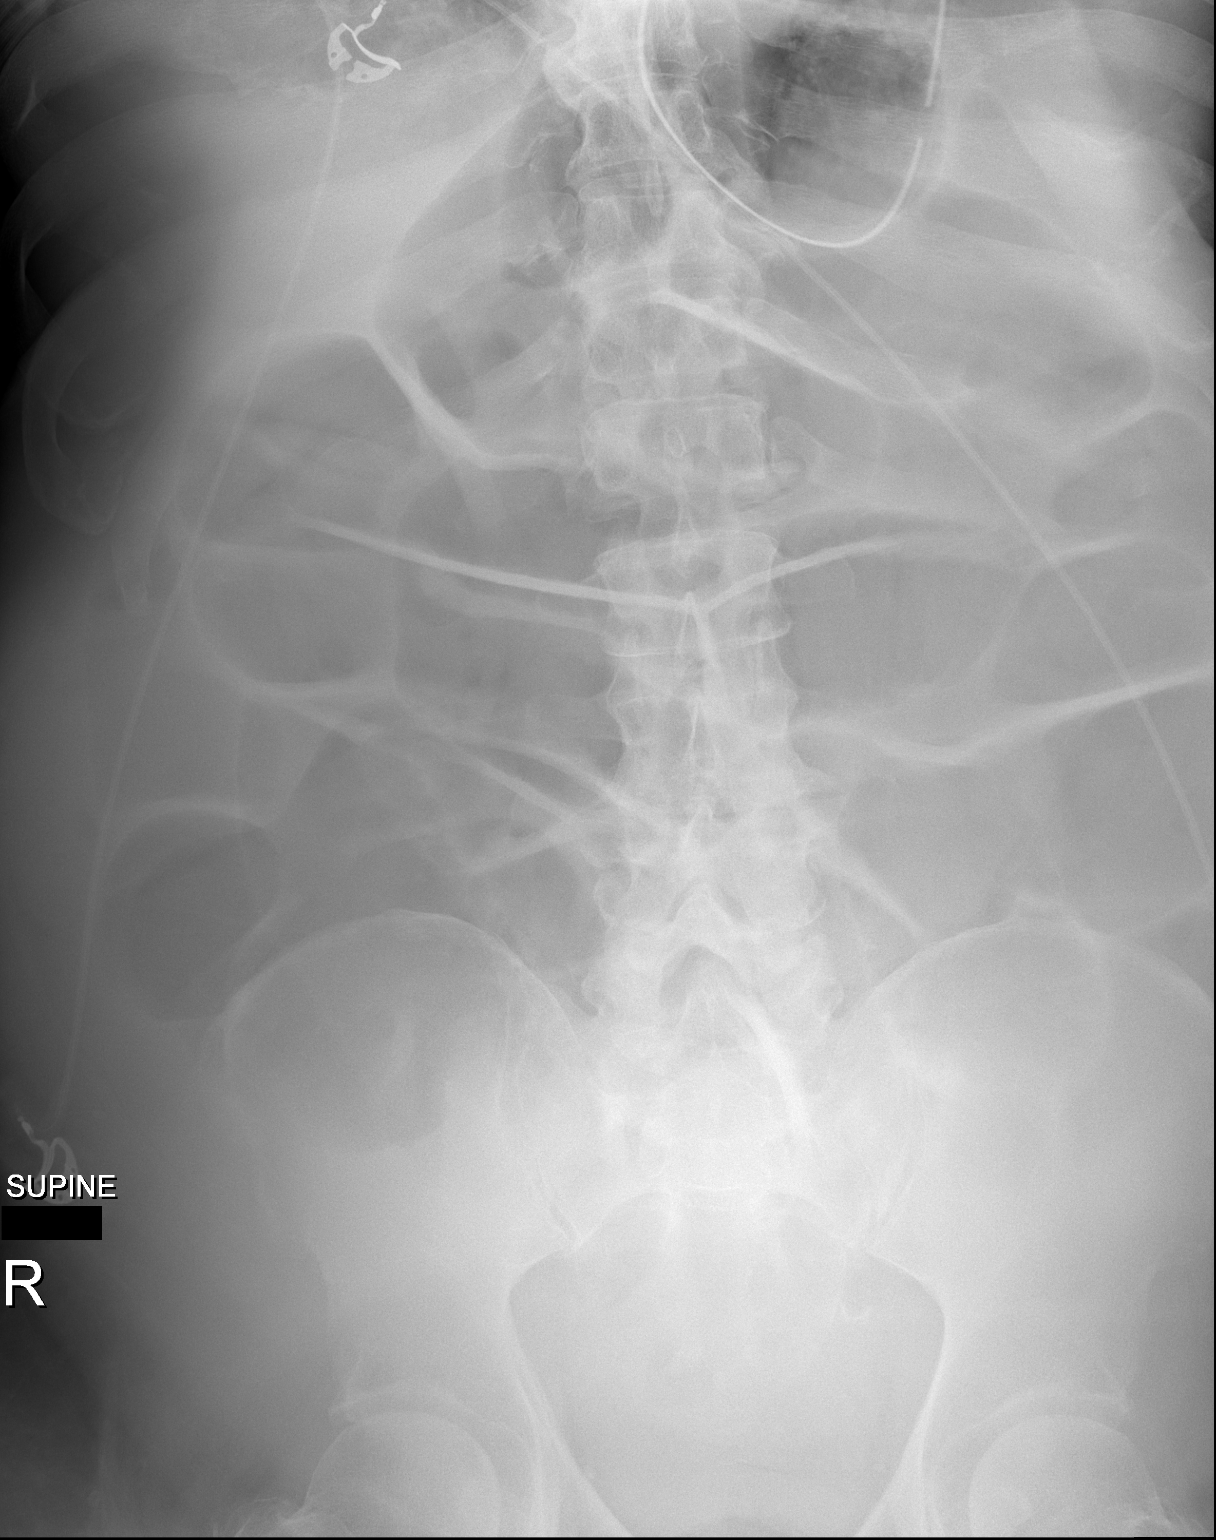

[x abdomen supine (2 of 2)]
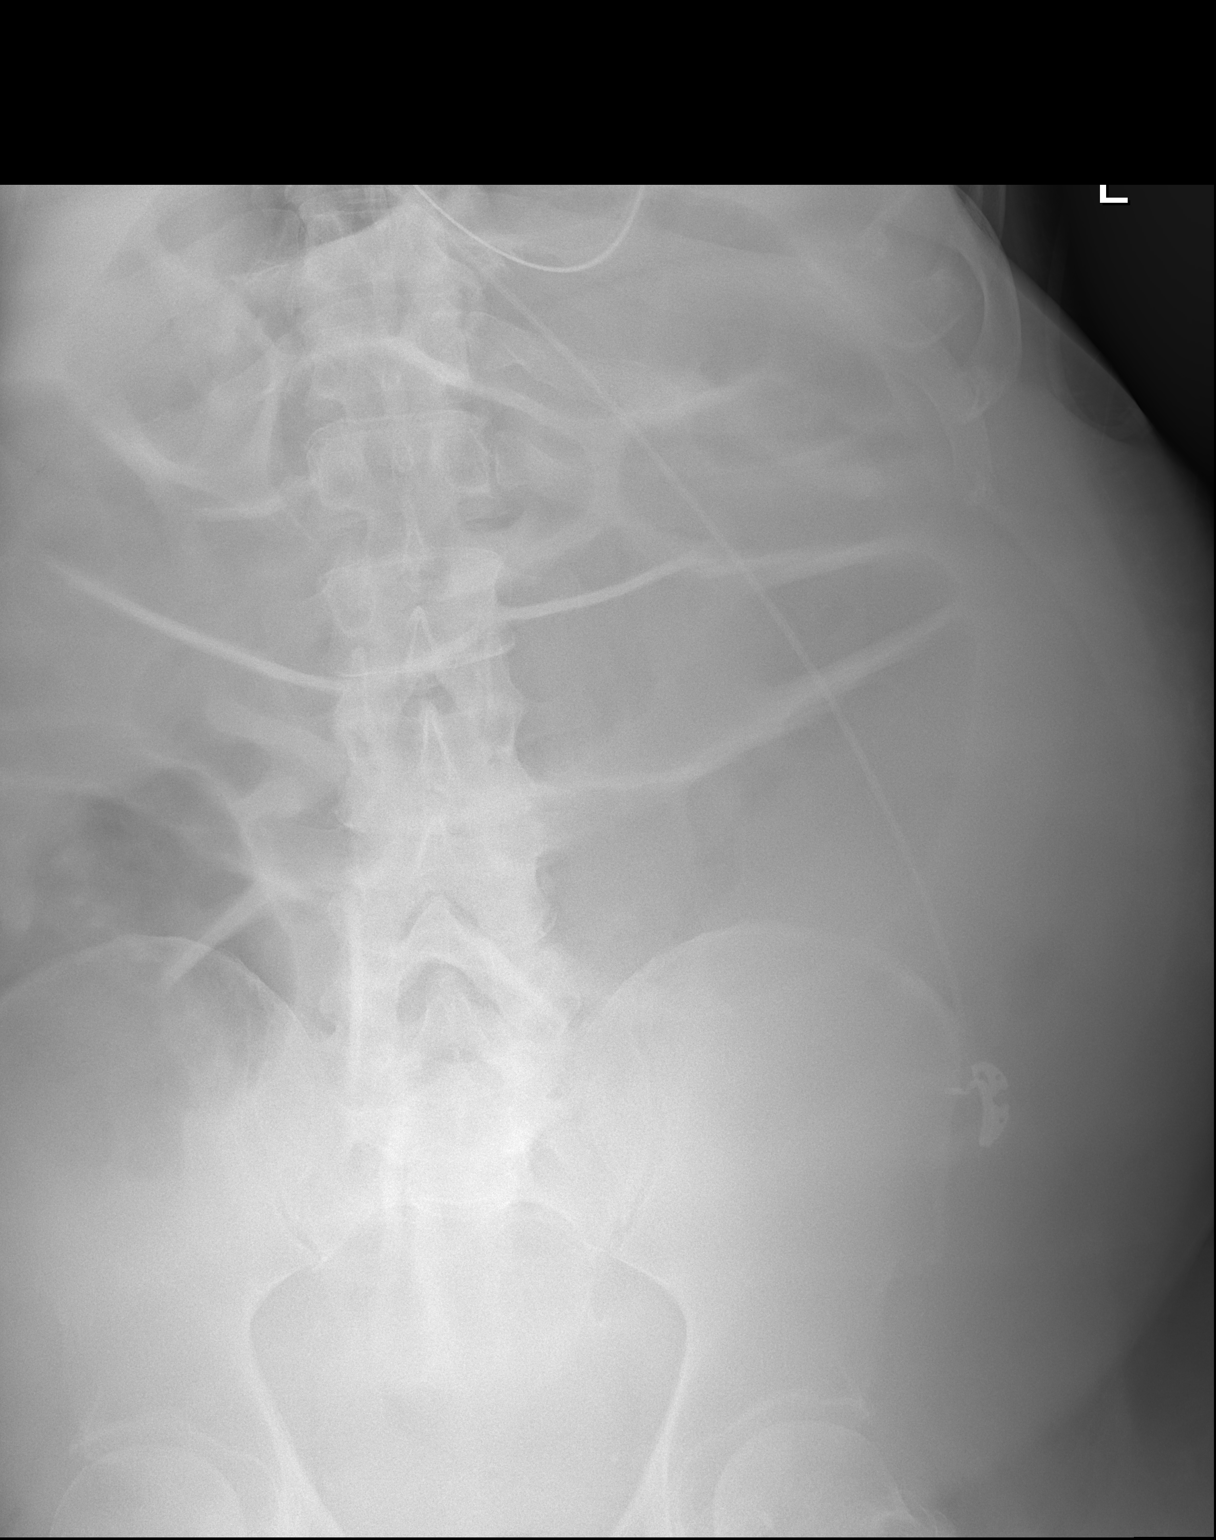

[2 of 2 positions shown; findings below may reference images not displayed]

FINDINGS: Stable small bowel dilatation is noted concerning for ileus.
Nasogastric tube is seen in proximal stomach. No radio-opaque
calculi or other significant radiographic abnormality are seen.
IMPRESSION: Stable small bowel dilatation is noted concerning for ileus.

## 2021-10-07 DIAGNOSIS — M6281 Muscle weakness (generalized): Secondary | ICD-10-CM | POA: Diagnosis not present

## 2021-10-07 DIAGNOSIS — N1831 Chronic kidney disease, stage 3a: Secondary | ICD-10-CM | POA: Diagnosis not present

## 2021-10-07 DIAGNOSIS — I10 Essential (primary) hypertension: Secondary | ICD-10-CM | POA: Diagnosis not present

## 2021-10-07 DIAGNOSIS — Z89512 Acquired absence of left leg below knee: Secondary | ICD-10-CM | POA: Diagnosis not present

## 2021-10-07 DIAGNOSIS — K5981 Ogilvie syndrome: Secondary | ICD-10-CM | POA: Diagnosis not present

## 2021-10-08 DIAGNOSIS — Z89512 Acquired absence of left leg below knee: Secondary | ICD-10-CM | POA: Diagnosis not present

## 2021-10-08 DIAGNOSIS — N1831 Chronic kidney disease, stage 3a: Secondary | ICD-10-CM | POA: Diagnosis not present

## 2021-10-08 DIAGNOSIS — M6281 Muscle weakness (generalized): Secondary | ICD-10-CM | POA: Diagnosis not present

## 2021-10-08 DIAGNOSIS — K5981 Ogilvie syndrome: Secondary | ICD-10-CM | POA: Diagnosis not present

## 2021-10-08 DIAGNOSIS — I10 Essential (primary) hypertension: Secondary | ICD-10-CM | POA: Diagnosis not present

## 2021-10-10 DIAGNOSIS — R69 Illness, unspecified: Secondary | ICD-10-CM | POA: Diagnosis not present

## 2021-10-10 DIAGNOSIS — Z89512 Acquired absence of left leg below knee: Secondary | ICD-10-CM | POA: Diagnosis not present

## 2021-10-10 DIAGNOSIS — K5981 Ogilvie syndrome: Secondary | ICD-10-CM | POA: Diagnosis not present

## 2021-10-10 DIAGNOSIS — F321 Major depressive disorder, single episode, moderate: Secondary | ICD-10-CM | POA: Diagnosis not present

## 2021-10-10 DIAGNOSIS — N1831 Chronic kidney disease, stage 3a: Secondary | ICD-10-CM | POA: Diagnosis not present

## 2021-10-10 DIAGNOSIS — I10 Essential (primary) hypertension: Secondary | ICD-10-CM | POA: Diagnosis not present

## 2021-10-10 DIAGNOSIS — M6281 Muscle weakness (generalized): Secondary | ICD-10-CM | POA: Diagnosis not present

## 2021-10-12 ENCOUNTER — Other Ambulatory Visit: Payer: Self-pay

## 2021-10-12 ENCOUNTER — Emergency Department (HOSPITAL_COMMUNITY)
Admission: EM | Admit: 2021-10-12 | Discharge: 2021-10-12 | Payer: Medicare HMO | Attending: Emergency Medicine | Admitting: Emergency Medicine

## 2021-10-12 ENCOUNTER — Encounter (HOSPITAL_COMMUNITY): Payer: Self-pay | Admitting: Emergency Medicine

## 2021-10-12 DIAGNOSIS — H5789 Other specified disorders of eye and adnexa: Secondary | ICD-10-CM | POA: Insufficient documentation

## 2021-10-12 DIAGNOSIS — E113599 Type 2 diabetes mellitus with proliferative diabetic retinopathy without macular edema, unspecified eye: Secondary | ICD-10-CM | POA: Diagnosis not present

## 2021-10-12 DIAGNOSIS — E119 Type 2 diabetes mellitus without complications: Secondary | ICD-10-CM | POA: Diagnosis not present

## 2021-10-12 DIAGNOSIS — Z7401 Bed confinement status: Secondary | ICD-10-CM | POA: Diagnosis not present

## 2021-10-12 DIAGNOSIS — Z7982 Long term (current) use of aspirin: Secondary | ICD-10-CM | POA: Insufficient documentation

## 2021-10-12 DIAGNOSIS — Z743 Need for continuous supervision: Secondary | ICD-10-CM | POA: Diagnosis not present

## 2021-10-12 DIAGNOSIS — R531 Weakness: Secondary | ICD-10-CM | POA: Diagnosis not present

## 2021-10-12 DIAGNOSIS — H4312 Vitreous hemorrhage, left eye: Secondary | ICD-10-CM | POA: Diagnosis not present

## 2021-10-12 DIAGNOSIS — I10 Essential (primary) hypertension: Secondary | ICD-10-CM | POA: Diagnosis not present

## 2021-10-12 DIAGNOSIS — H539 Unspecified visual disturbance: Secondary | ICD-10-CM

## 2021-10-12 DIAGNOSIS — H4310 Vitreous hemorrhage, unspecified eye: Secondary | ICD-10-CM | POA: Diagnosis not present

## 2021-10-12 NOTE — Discharge Instructions (Addendum)
Please go directly to [facility] so that our eye specialist on-call can look at you - when you arrive, tell them that we discussed your case with Dr Tobe Sos  Also follow up with your primary care doctor regarding your blood pressure, which is mildly high today.

## 2021-10-12 NOTE — ED Triage Notes (Signed)
BIBA Per EMS: Pt coming from [place] w/ c/o left eye issue. Pt reports unable to see/seeing red in that eye. 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

## 2021-10-12 NOTE — ED Provider Notes (Signed)
[facility] DEPT Provider Note   CSN: [id number] Arrival date & time: 10/12/21  1018     History  Chief Complaint  Patient presents with   Eye Issue    [name] is a 61 y.o. male.  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 Denies eye pain. At baseline he indicates vision is poor/blurry with left, but  now it is red and somewhat more blurry (from prior ED visit 11/2020, pt noted to be blind in right eye and able to see shapes/fingers w left but unable to count fingers with left).  Pt indicates he can see my form/shape, and see my hand, but cannot make out features, or count fingers, and that everything appears red.  No headache. No new numbness/weakness. No change in speech. No anticoag use.   The history is provided by the patient, medical records and the EMS personnel.       Home Medications Prior to Admission medications   Medication Sig Start Date End Date Taking? Authorizing Provider  acetaminophen (TYLENOL) 650 MG CR tablet Take 650 mg by mouth every 4 (four) hours as needed (for general discomfort).    [provider]  amLODipine (NORVASC) 10 MG tablet Take 0.5 tablets (5 mg total) by mouth daily. 03/12/21   Aline August, MD  aspirin 81 MG chewable tablet Chew 81 mg by mouth daily.    [provider]  atorvastatin (LIPITOR) 80 MG tablet Take 80 mg by mouth at bedtime.    [provider]  brimonidine (ALPHAGAN) 0.2 % ophthalmic solution Place 1 drop into both eyes in the morning and at bedtime. 12/20/20   Blanchie Dessert, MD  dorzolamide (TRUSOPT) 2 % ophthalmic solution Place 1 drop into both eyes 2 (two) times daily. 12/20/20   Blanchie Dessert, MD  ferrous sulfate 325 (65 FE) MG tablet Take 1 tablet (325 mg total) by mouth daily. 09/23/20   [name], [name], DO  insulin glargine-yfgn (SEMGLEE) 100  UNIT/ML Pen Inject 3 Units into the skin daily. 03/12/21   Aline August, MD  Multiple Vitamin (MULTIVITAMIN) tablet Take 1 tablet by mouth daily.    [provider]  ondansetron (ZOFRAN) 4 MG tablet Take 4 mg by mouth every 4 (four) hours as needed for nausea or vomiting.    [provider]  pantoprazole (PROTONIX) 40 MG tablet Take 40 mg by mouth daily.    [provider]  pregabalin (LYRICA) 75 MG capsule Take 75 mg by mouth 2 (two) times daily.    [provider]  saccharomyces boulardii (FLORASTOR) 250 MG capsule Take 1 capsule (250 mg total) by mouth 2 (two) times daily. 09/07/20   Antonieta Pert, MD  senna-docusate (SENOKOT-S) 8.6-50 MG tablet Take 1 tablet by mouth daily.    [provider]  sertraline (ZOLOFT) 50 MG tablet Take 50 mg by mouth at bedtime.    [provider]  Skin Protectants, Misc. (C[place] MOIST BARRIER/ZINC) CREA Apply 1 application topically See admin instructions. Apply to bilateral shin, heels,left lower buttock skin tear. Place foam dressing over bilateral shins every day and night shift for wound care    [provider]  tamsulosin (FLOMAX) 0.4 MG CAPS capsule Take 0.8 mg by mouth daily.    [provider]  Vitamin D, Ergocalciferol, (DRISDOL) 1.25 MG (50000 UNIT) CAPS capsule Take 50,000 Units by mouth  every 30 (thirty) days. 17th of each month    [provider]      Allergies    Other    Review of Systems   Review of Systems  Constitutional:  Negative for fever.  Eyes:  Negative for pain.  Gastrointestinal:  Negative for nausea and vomiting.  Neurological:  Negative for speech difficulty, weakness, numbness and headaches.  Hematological:  Does not bruise/bleed easily.    Physical Exam Updated Vital Signs BP (!) 160/96 (BP Location: Left Arm)   Pulse 78   Temp 97.8 F (36.6 C) (Oral)   Resp 20   SpO2 97%  Physical Exam Vitals and nursing note reviewed.  Constitutional:       Appearance: Normal appearance. He is well-developed.  HENT:     Head: Atraumatic.     Nose: Nose normal.     Mouth/Throat:     Mouth: Mucous membranes are moist.     Pharynx: Oropharynx is clear.  Eyes:     General: No scleral icterus.    Conjunctiva/sclera: Conjunctivae normal.     Comments: Right pupil ~ 5 mm, not reactive. Left pupils 3 mm, reactive.   Neck:     Vascular: No carotid bruit.     Trachea: No tracheal deviation.  Cardiovascular:     Rate and Rhythm: Normal rate and regular rhythm.     Pulses: Normal pulses.  Pulmonary:     Effort: Pulmonary effort is normal. No accessory muscle usage or respiratory distress.     Breath sounds: Normal breath sounds.  Abdominal:     General: There is no distension.     Tenderness: There is no abdominal tenderness.  Genitourinary:    Comments: No cva tenderness. Musculoskeletal:        General: No swelling.     Cervical back: Neck supple. No rigidity.  Skin:    General: Skin is warm and dry.     Findings: No rash.  Neurological:     Mental Status: He is alert.     Comments: Alert, speech clear. Motor/sens grossly intact bil.   Psychiatric:        Mood and Affect: Mood normal.     ED Results / Procedures / Treatments   Labs (all labs ordered are listed, but only abnormal results are displayed) Labs Reviewed - No data to display  EKG None  Radiology No results found.  Procedures Procedures    Medications Ordered in ED Medications - No data to display  ED Course/ Medical Decision Making/ A&P                           Medical Decision Making Problems Addressed: Essential hypertension: chronic illness or injury with exacerbation, progression, or side effects of treatment that poses a threat to life or bodily functions Insulin dependent type 2 diabetes mellitus ([place]): chronic illness or injury with exacerbation, progression, or side effects of treatment that poses a threat to life or bodily functions Visual  disturbance: acute illness or injury  Amount and/or Complexity of Data Reviewed Independent Historian: EMS    Details: hx   Iv ns. Continuous pulse ox and cardiac monitoring.   Reviewed nursing notes and prior charts for additional history. External reports reviewed. Additional history from: EMS.   Cardiac monitor: sinus rhythm, rate 78.   ?possible vitreous hemorrhage. Will consult/discuss w ophthalmology.  Discussed pt with Dr Tobe Sos - he requests we send to their office so  that they can evaluate him.  Will see there now.              Final Clinical Impression(s) / ED Diagnoses Final diagnoses:  None    Rx / DC Orders ED Discharge Orders     None         Lajean Saver, MD 10/12/21 1127

## 2021-10-12 NOTE — ED Notes (Signed)
[facility] called, and transport arranged.

## 2021-10-15 DIAGNOSIS — E113512 Type 2 diabetes mellitus with proliferative diabetic retinopathy with macular edema, left eye: Secondary | ICD-10-CM | POA: Diagnosis not present

## 2021-10-15 DIAGNOSIS — E113513 Type 2 diabetes mellitus with proliferative diabetic retinopathy with macular edema, bilateral: Secondary | ICD-10-CM | POA: Diagnosis not present

## 2021-10-15 DIAGNOSIS — H401123 Primary open-angle glaucoma, left eye, severe stage: Secondary | ICD-10-CM | POA: Diagnosis not present

## 2021-10-15 DIAGNOSIS — H5462 Unqualified visual loss, left eye, normal vision right eye: Secondary | ICD-10-CM | POA: Diagnosis not present

## 2021-10-15 DIAGNOSIS — H4313 Vitreous hemorrhage, bilateral: Secondary | ICD-10-CM | POA: Diagnosis not present

## 2021-10-15 DIAGNOSIS — H4312 Vitreous hemorrhage, left eye: Secondary | ICD-10-CM | POA: Diagnosis not present

## 2021-10-15 DIAGNOSIS — H4051X3 Glaucoma secondary to other eye disorders, right eye, severe stage: Secondary | ICD-10-CM | POA: Diagnosis not present

## 2021-10-15 DIAGNOSIS — I69898 Other sequelae of other cerebrovascular disease: Secondary | ICD-10-CM | POA: Diagnosis not present

## 2021-10-15 DIAGNOSIS — H2513 Age-related nuclear cataract, bilateral: Secondary | ICD-10-CM | POA: Diagnosis not present

## 2021-10-15 DIAGNOSIS — E113511 Type 2 diabetes mellitus with proliferative diabetic retinopathy with macular edema, right eye: Secondary | ICD-10-CM | POA: Diagnosis not present

## 2021-10-15 DIAGNOSIS — H35372 Puckering of macula, left eye: Secondary | ICD-10-CM | POA: Diagnosis not present

## 2021-10-15 DIAGNOSIS — Z7984 Long term (current) use of oral hypoglycemic drugs: Secondary | ICD-10-CM | POA: Diagnosis not present

## 2021-10-15 DIAGNOSIS — H538 Other visual disturbances: Secondary | ICD-10-CM | POA: Diagnosis not present

## 2021-10-16 DIAGNOSIS — I5030 Unspecified diastolic (congestive) heart failure: Secondary | ICD-10-CM | POA: Diagnosis not present

## 2021-10-16 DIAGNOSIS — Z89512 Acquired absence of left leg below knee: Secondary | ICD-10-CM | POA: Diagnosis not present

## 2021-10-16 DIAGNOSIS — K219 Gastro-esophageal reflux disease without esophagitis: Secondary | ICD-10-CM | POA: Diagnosis not present

## 2021-10-16 DIAGNOSIS — M6281 Muscle weakness (generalized): Secondary | ICD-10-CM | POA: Diagnosis not present

## 2021-10-16 DIAGNOSIS — N1831 Chronic kidney disease, stage 3a: Secondary | ICD-10-CM | POA: Diagnosis not present

## 2021-10-16 DIAGNOSIS — K5981 Ogilvie syndrome: Secondary | ICD-10-CM | POA: Diagnosis not present

## 2021-10-16 DIAGNOSIS — I2510 Atherosclerotic heart disease of native coronary artery without angina pectoris: Secondary | ICD-10-CM | POA: Diagnosis not present

## 2021-10-16 DIAGNOSIS — I10 Essential (primary) hypertension: Secondary | ICD-10-CM | POA: Diagnosis not present

## 2021-10-17 DIAGNOSIS — M6281 Muscle weakness (generalized): Secondary | ICD-10-CM | POA: Diagnosis not present

## 2021-10-17 DIAGNOSIS — K5981 Ogilvie syndrome: Secondary | ICD-10-CM | POA: Diagnosis not present

## 2021-10-17 DIAGNOSIS — N1831 Chronic kidney disease, stage 3a: Secondary | ICD-10-CM | POA: Diagnosis not present

## 2021-10-17 DIAGNOSIS — Z89512 Acquired absence of left leg below knee: Secondary | ICD-10-CM | POA: Diagnosis not present

## 2021-10-17 DIAGNOSIS — I10 Essential (primary) hypertension: Secondary | ICD-10-CM | POA: Diagnosis not present

## 2021-10-18 DIAGNOSIS — Z89512 Acquired absence of left leg below knee: Secondary | ICD-10-CM | POA: Diagnosis not present

## 2021-10-18 DIAGNOSIS — N1831 Chronic kidney disease, stage 3a: Secondary | ICD-10-CM | POA: Diagnosis not present

## 2021-10-18 DIAGNOSIS — M6281 Muscle weakness (generalized): Secondary | ICD-10-CM | POA: Diagnosis not present

## 2021-10-18 DIAGNOSIS — I10 Essential (primary) hypertension: Secondary | ICD-10-CM | POA: Diagnosis not present

## 2021-10-18 DIAGNOSIS — K5981 Ogilvie syndrome: Secondary | ICD-10-CM | POA: Diagnosis not present

## 2021-10-19 ENCOUNTER — Other Ambulatory Visit: Payer: Self-pay

## 2021-10-19 ENCOUNTER — Emergency Department (HOSPITAL_COMMUNITY): Payer: Medicare HMO

## 2021-10-19 ENCOUNTER — Encounter (HOSPITAL_COMMUNITY): Payer: Self-pay | Admitting: Emergency Medicine

## 2021-10-19 ENCOUNTER — Inpatient Hospital Stay (HOSPITAL_COMMUNITY)
Admission: EM | Admit: 2021-10-19 | Discharge: 2021-11-01 | DRG: 871 | Disposition: A | Discharge status: Skilled Nursing Facility | Payer: Medicare HMO | Attending: Family Medicine | Admitting: Family Medicine

## 2021-10-19 DIAGNOSIS — Z833 Family history of diabetes mellitus: Secondary | ICD-10-CM

## 2021-10-19 DIAGNOSIS — I10 Essential (primary) hypertension: Secondary | ICD-10-CM | POA: Diagnosis not present

## 2021-10-19 DIAGNOSIS — M7989 Other specified soft tissue disorders: Secondary | ICD-10-CM

## 2021-10-19 DIAGNOSIS — E876 Hypokalemia: Secondary | ICD-10-CM | POA: Diagnosis present

## 2021-10-19 DIAGNOSIS — N401 Enlarged prostate with lower urinary tract symptoms: Secondary | ICD-10-CM | POA: Diagnosis present

## 2021-10-19 DIAGNOSIS — R042 Hemoptysis: Secondary | ICD-10-CM | POA: Diagnosis present

## 2021-10-19 DIAGNOSIS — R509 Fever, unspecified: Secondary | ICD-10-CM | POA: Diagnosis not present

## 2021-10-19 DIAGNOSIS — N400 Enlarged prostate without lower urinary tract symptoms: Secondary | ICD-10-CM | POA: Diagnosis present

## 2021-10-19 DIAGNOSIS — Z7189 Other specified counseling: Secondary | ICD-10-CM | POA: Diagnosis not present

## 2021-10-19 DIAGNOSIS — L8990 Pressure ulcer of unspecified site, unspecified stage: Secondary | ICD-10-CM | POA: Diagnosis present

## 2021-10-19 DIAGNOSIS — F332 Major depressive disorder, recurrent severe without psychotic features: Secondary | ICD-10-CM | POA: Diagnosis present

## 2021-10-19 DIAGNOSIS — R000 Tachycardia, unspecified: Secondary | ICD-10-CM | POA: Diagnosis not present

## 2021-10-19 DIAGNOSIS — R413 Other amnesia: Secondary | ICD-10-CM | POA: Diagnosis not present

## 2021-10-19 DIAGNOSIS — J9601 Acute respiratory failure with hypoxia: Secondary | ICD-10-CM | POA: Diagnosis present

## 2021-10-19 DIAGNOSIS — E1140 Type 2 diabetes mellitus with diabetic neuropathy, unspecified: Secondary | ICD-10-CM | POA: Diagnosis present

## 2021-10-19 DIAGNOSIS — I509 Heart failure, unspecified: Secondary | ICD-10-CM | POA: Diagnosis not present

## 2021-10-19 DIAGNOSIS — J90 Pleural effusion, not elsewhere classified: Secondary | ICD-10-CM | POA: Diagnosis not present

## 2021-10-19 DIAGNOSIS — I352 Nonrheumatic aortic (valve) stenosis with insufficiency: Secondary | ICD-10-CM | POA: Diagnosis not present

## 2021-10-19 DIAGNOSIS — Y95 Nosocomial condition: Secondary | ICD-10-CM | POA: Diagnosis not present

## 2021-10-19 DIAGNOSIS — J189 Pneumonia, unspecified organism: Secondary | ICD-10-CM

## 2021-10-19 DIAGNOSIS — Z6841 Body Mass Index (BMI) 40.0 and over, adult: Secondary | ICD-10-CM

## 2021-10-19 DIAGNOSIS — D509 Iron deficiency anemia, unspecified: Secondary | ICD-10-CM | POA: Insufficient documentation

## 2021-10-19 DIAGNOSIS — N183 Chronic kidney disease, stage 3 unspecified: Secondary | ICD-10-CM | POA: Diagnosis not present

## 2021-10-19 DIAGNOSIS — E1169 Type 2 diabetes mellitus with other specified complication: Secondary | ICD-10-CM

## 2021-10-19 DIAGNOSIS — N1831 Chronic kidney disease, stage 3a: Secondary | ICD-10-CM | POA: Diagnosis not present

## 2021-10-19 DIAGNOSIS — I69351 Hemiplegia and hemiparesis following cerebral infarction affecting right dominant side: Secondary | ICD-10-CM | POA: Diagnosis not present

## 2021-10-19 DIAGNOSIS — E1151 Type 2 diabetes mellitus with diabetic peripheral angiopathy without gangrene: Secondary | ICD-10-CM | POA: Diagnosis present

## 2021-10-19 DIAGNOSIS — E042 Nontoxic multinodular goiter: Secondary | ICD-10-CM | POA: Diagnosis not present

## 2021-10-19 DIAGNOSIS — R808 Other proteinuria: Secondary | ICD-10-CM | POA: Diagnosis present

## 2021-10-19 DIAGNOSIS — I5032 Chronic diastolic (congestive) heart failure: Secondary | ICD-10-CM

## 2021-10-19 DIAGNOSIS — T502X5A Adverse effect of carbonic-anhydrase inhibitors, benzothiadiazides and other diuretics, initial encounter: Secondary | ICD-10-CM | POA: Diagnosis not present

## 2021-10-19 DIAGNOSIS — E119 Type 2 diabetes mellitus without complications: Secondary | ICD-10-CM

## 2021-10-19 DIAGNOSIS — L98429 Non-pressure chronic ulcer of back with unspecified severity: Secondary | ICD-10-CM | POA: Diagnosis present

## 2021-10-19 DIAGNOSIS — A419 Sepsis, unspecified organism: Secondary | ICD-10-CM | POA: Diagnosis not present

## 2021-10-19 DIAGNOSIS — E1122 Type 2 diabetes mellitus with diabetic chronic kidney disease: Secondary | ICD-10-CM | POA: Diagnosis present

## 2021-10-19 DIAGNOSIS — R34 Anuria and oliguria: Secondary | ICD-10-CM | POA: Diagnosis not present

## 2021-10-19 DIAGNOSIS — E872 Acidosis, unspecified: Secondary | ICD-10-CM

## 2021-10-19 DIAGNOSIS — Z515 Encounter for palliative care: Secondary | ICD-10-CM

## 2021-10-19 DIAGNOSIS — R0902 Hypoxemia: Secondary | ICD-10-CM | POA: Diagnosis not present

## 2021-10-19 DIAGNOSIS — D649 Anemia, unspecified: Secondary | ICD-10-CM

## 2021-10-19 DIAGNOSIS — Z794 Long term (current) use of insulin: Secondary | ICD-10-CM

## 2021-10-19 DIAGNOSIS — Z20822 Contact with and (suspected) exposure to covid-19: Secondary | ICD-10-CM | POA: Diagnosis present

## 2021-10-19 DIAGNOSIS — I3139 Other pericardial effusion (noninflammatory): Secondary | ICD-10-CM | POA: Diagnosis not present

## 2021-10-19 DIAGNOSIS — Z7982 Long term (current) use of aspirin: Secondary | ICD-10-CM

## 2021-10-19 DIAGNOSIS — F40240 Claustrophobia: Secondary | ICD-10-CM | POA: Diagnosis present

## 2021-10-19 DIAGNOSIS — I110 Hypertensive heart disease with heart failure: Secondary | ICD-10-CM | POA: Diagnosis not present

## 2021-10-19 DIAGNOSIS — E1149 Type 2 diabetes mellitus with other diabetic neurological complication: Secondary | ICD-10-CM | POA: Diagnosis not present

## 2021-10-19 DIAGNOSIS — I130 Hypertensive heart and chronic kidney disease with heart failure and stage 1 through stage 4 chronic kidney disease, or unspecified chronic kidney disease: Secondary | ICD-10-CM | POA: Diagnosis not present

## 2021-10-19 DIAGNOSIS — Z789 Other specified health status: Secondary | ICD-10-CM | POA: Diagnosis not present

## 2021-10-19 DIAGNOSIS — J9600 Acute respiratory failure, unspecified whether with hypoxia or hypercapnia: Secondary | ICD-10-CM | POA: Diagnosis not present

## 2021-10-19 DIAGNOSIS — R54 Age-related physical debility: Secondary | ICD-10-CM | POA: Diagnosis present

## 2021-10-19 DIAGNOSIS — E875 Hyperkalemia: Secondary | ICD-10-CM | POA: Diagnosis present

## 2021-10-19 DIAGNOSIS — R109 Unspecified abdominal pain: Secondary | ICD-10-CM

## 2021-10-19 DIAGNOSIS — J45991 Cough variant asthma: Secondary | ICD-10-CM | POA: Diagnosis not present

## 2021-10-19 DIAGNOSIS — I5030 Unspecified diastolic (congestive) heart failure: Secondary | ICD-10-CM | POA: Diagnosis not present

## 2021-10-19 DIAGNOSIS — R0602 Shortness of breath: Secondary | ICD-10-CM | POA: Diagnosis not present

## 2021-10-19 DIAGNOSIS — I739 Peripheral vascular disease, unspecified: Secondary | ICD-10-CM | POA: Diagnosis present

## 2021-10-19 DIAGNOSIS — J440 Chronic obstructive pulmonary disease with acute lower respiratory infection: Secondary | ICD-10-CM | POA: Diagnosis not present

## 2021-10-19 DIAGNOSIS — I959 Hypotension, unspecified: Secondary | ICD-10-CM | POA: Diagnosis not present

## 2021-10-19 DIAGNOSIS — F321 Major depressive disorder, single episode, moderate: Secondary | ICD-10-CM | POA: Diagnosis not present

## 2021-10-19 DIAGNOSIS — I2510 Atherosclerotic heart disease of native coronary artery without angina pectoris: Secondary | ICD-10-CM | POA: Diagnosis present

## 2021-10-19 DIAGNOSIS — N179 Acute kidney failure, unspecified: Secondary | ICD-10-CM | POA: Diagnosis not present

## 2021-10-19 DIAGNOSIS — I272 Pulmonary hypertension, unspecified: Secondary | ICD-10-CM | POA: Diagnosis not present

## 2021-10-19 DIAGNOSIS — Z743 Need for continuous supervision: Secondary | ICD-10-CM | POA: Diagnosis not present

## 2021-10-19 DIAGNOSIS — Z79899 Other long term (current) drug therapy: Secondary | ICD-10-CM

## 2021-10-19 DIAGNOSIS — E1110 Type 2 diabetes mellitus with ketoacidosis without coma: Secondary | ICD-10-CM | POA: Diagnosis not present

## 2021-10-19 DIAGNOSIS — Z7401 Bed confinement status: Secondary | ICD-10-CM | POA: Diagnosis not present

## 2021-10-19 DIAGNOSIS — Z87891 Personal history of nicotine dependence: Secondary | ICD-10-CM

## 2021-10-19 DIAGNOSIS — E782 Mixed hyperlipidemia: Secondary | ICD-10-CM | POA: Diagnosis not present

## 2021-10-19 DIAGNOSIS — Z823 Family history of stroke: Secondary | ICD-10-CM

## 2021-10-19 DIAGNOSIS — Z8249 Family history of ischemic heart disease and other diseases of the circulatory system: Secondary | ICD-10-CM

## 2021-10-19 DIAGNOSIS — F319 Bipolar disorder, unspecified: Secondary | ICD-10-CM | POA: Diagnosis not present

## 2021-10-19 DIAGNOSIS — R69 Illness, unspecified: Secondary | ICD-10-CM | POA: Diagnosis not present

## 2021-10-19 DIAGNOSIS — R338 Other retention of urine: Secondary | ICD-10-CM | POA: Diagnosis present

## 2021-10-19 DIAGNOSIS — I5033 Acute on chronic diastolic (congestive) heart failure: Secondary | ICD-10-CM | POA: Diagnosis present

## 2021-10-19 DIAGNOSIS — I129 Hypertensive chronic kidney disease with stage 1 through stage 4 chronic kidney disease, or unspecified chronic kidney disease: Secondary | ICD-10-CM

## 2021-10-19 DIAGNOSIS — Z89512 Acquired absence of left leg below knee: Secondary | ICD-10-CM

## 2021-10-19 DIAGNOSIS — Z86718 Personal history of other venous thrombosis and embolism: Secondary | ICD-10-CM

## 2021-10-19 DIAGNOSIS — E1165 Type 2 diabetes mellitus with hyperglycemia: Secondary | ICD-10-CM | POA: Diagnosis not present

## 2021-10-19 LAB — BRAIN NATRIURETIC PEPTIDE: B Natriuretic Peptide: 547.9 pg/mL — ABNORMAL HIGH (ref 0.0–100.0)

## 2021-10-19 LAB — URINALYSIS, ROUTINE W REFLEX MICROSCOPIC
Bilirubin Urine: NEGATIVE
Glucose, UA: 50 mg/dL — AB
Hgb urine dipstick: NEGATIVE
Ketones, ur: NEGATIVE mg/dL
Leukocytes,Ua: NEGATIVE
Nitrite: NEGATIVE
Protein, ur: 100 mg/dL — AB
Specific Gravity, Urine: 1.012 (ref 1.005–1.030)
pH: 5 (ref 5.0–8.0)

## 2021-10-19 LAB — CBC WITH DIFFERENTIAL/PLATELET
Abs Immature Granulocytes: 0.04 10*3/uL (ref 0.00–0.07)
Basophils Absolute: 0 10*3/uL (ref 0.0–0.1)
Basophils Relative: 0 %
Eosinophils Absolute: 0 10*3/uL (ref 0.0–0.5)
Eosinophils Relative: 1 %
HCT: 30 % — ABNORMAL LOW (ref 39.0–52.0)
Hemoglobin: 8.7 g/dL — ABNORMAL LOW (ref 13.0–17.0)
Immature Granulocytes: 1 %
Lymphocytes Relative: 9 %
Lymphs Abs: 0.5 10*3/uL — ABNORMAL LOW (ref 0.7–4.0)
MCH: 24.6 pg — ABNORMAL LOW (ref 26.0–34.0)
MCHC: 29 g/dL — ABNORMAL LOW (ref 30.0–36.0)
MCV: 85 fL (ref 80.0–100.0)
Monocytes Absolute: 0.5 10*3/uL (ref 0.1–1.0)
Monocytes Relative: 9 %
Neutro Abs: 4.4 10*3/uL (ref 1.7–7.7)
Neutrophils Relative %: 80 %
Platelets: 247 10*3/uL (ref 150–400)
RBC: 3.53 MIL/uL — ABNORMAL LOW (ref 4.22–5.81)
RDW: 17.7 % — ABNORMAL HIGH (ref 11.5–15.5)
WBC: 5.5 10*3/uL (ref 4.0–10.5)
nRBC: 0 % (ref 0.0–0.2)

## 2021-10-19 LAB — COMPREHENSIVE METABOLIC PANEL
ALT: 20 U/L (ref 0–44)
AST: 16 U/L (ref 15–41)
Albumin: 2.8 g/dL — ABNORMAL LOW (ref 3.5–5.0)
Alkaline Phosphatase: 126 U/L (ref 38–126)
Anion gap: 9 (ref 5–15)
BUN: 40 mg/dL — ABNORMAL HIGH (ref 6–20)
CO2: 16 mmol/L — ABNORMAL LOW (ref 22–32)
Calcium: 8.9 mg/dL (ref 8.9–10.3)
Chloride: 115 mmol/L — ABNORMAL HIGH (ref 98–111)
Creatinine, Ser: 2.03 mg/dL — ABNORMAL HIGH (ref 0.61–1.24)
GFR, Estimated: 37 mL/min — ABNORMAL LOW (ref >60)
Glucose, Bld: 122 mg/dL — ABNORMAL HIGH (ref 70–99)
Potassium: 5.8 mmol/L — ABNORMAL HIGH (ref 3.5–5.1)
Sodium: 140 mmol/L (ref 135–145)
Total Bilirubin: 0.8 mg/dL (ref 0.3–1.2)
Total Protein: 7.5 g/dL (ref 6.5–8.1)

## 2021-10-19 LAB — BASIC METABOLIC PANEL
Anion gap: 6 (ref 5–15)
BUN: 42 mg/dL — ABNORMAL HIGH (ref 6–20)
CO2: 17 mmol/L — ABNORMAL LOW (ref 22–32)
Calcium: 8.4 mg/dL — ABNORMAL LOW (ref 8.9–10.3)
Chloride: 116 mmol/L — ABNORMAL HIGH (ref 98–111)
Creatinine, Ser: 1.97 mg/dL — ABNORMAL HIGH (ref 0.61–1.24)
GFR, Estimated: 38 mL/min — ABNORMAL LOW (ref >60)
Glucose, Bld: 116 mg/dL — ABNORMAL HIGH (ref 70–99)
Potassium: 5.4 mmol/L — ABNORMAL HIGH (ref 3.5–5.1)
Sodium: 139 mmol/L (ref 135–145)

## 2021-10-19 LAB — TROPONIN I (HIGH SENSITIVITY)
Troponin I (High Sensitivity): 20 ng/L — ABNORMAL HIGH (ref <18)
Troponin I (High Sensitivity): 35 ng/L — ABNORMAL HIGH (ref <18)
Troponin I (High Sensitivity): 38 ng/L — ABNORMAL HIGH (ref <18)

## 2021-10-19 LAB — CBG MONITORING, ED: Glucose-Capillary: 120 mg/dL — ABNORMAL HIGH (ref 70–99)

## 2021-10-19 LAB — TSH: TSH: 1.541 u[IU]/mL (ref 0.350–4.500)

## 2021-10-19 LAB — SARS CORONAVIRUS 2 BY RT PCR: SARS Coronavirus 2 by RT PCR: NEGATIVE

## 2021-10-19 LAB — LIPASE, BLOOD: Lipase: 20 U/L (ref 11–51)

## 2021-10-19 LAB — LACTIC ACID, PLASMA: Lactic Acid, Venous: 1.1 mmol/L (ref 0.5–1.9)

## 2021-10-19 LAB — GLUCOSE, CAPILLARY: Glucose-Capillary: 121 mg/dL — ABNORMAL HIGH (ref 70–99)

## 2021-10-19 MED ORDER — SODIUM CHLORIDE 0.9 % IV SOLN
2.0000 g | INTRAVENOUS | Status: DC
  Administered 2021-10-20 – 2021-10-24 (×5): 2 g via INTRAVENOUS
  Filled 2021-10-19 (×5): qty 20

## 2021-10-19 MED ORDER — SERTRALINE HCL 50 MG PO TABS
50.0000 mg | ORAL_TABLET | Freq: Every day | ORAL | Status: DC
  Administered 2021-10-19 – 2021-10-25 (×7): 50 mg via ORAL
  Filled 2021-10-19 (×11): qty 1

## 2021-10-19 MED ORDER — ENOXAPARIN SODIUM 30 MG/0.3ML IJ SOSY
30.0000 mg | PREFILLED_SYRINGE | INTRAMUSCULAR | Status: DC
  Administered 2021-10-20: 30 mg via SUBCUTANEOUS
  Filled 2021-10-19: qty 0.3

## 2021-10-19 MED ORDER — SODIUM CHLORIDE 0.9 % IV SOLN
500.0000 mg | Freq: Once | INTRAVENOUS | Status: AC
  Administered 2021-10-19: 500 mg via INTRAVENOUS
  Filled 2021-10-19: qty 5

## 2021-10-19 MED ORDER — ASPIRIN 81 MG PO CHEW
81.0000 mg | CHEWABLE_TABLET | Freq: Every day | ORAL | Status: DC
  Administered 2021-10-20 – 2021-11-01 (×13): 81 mg via ORAL
  Filled 2021-10-19 (×13): qty 1

## 2021-10-19 MED ORDER — FUROSEMIDE 10 MG/ML IJ SOLN
40.0000 mg | Freq: Four times a day (QID) | INTRAMUSCULAR | Status: AC
  Administered 2021-10-19 (×2): 40 mg via INTRAVENOUS
  Filled 2021-10-19 (×2): qty 4

## 2021-10-19 MED ORDER — TAMSULOSIN HCL 0.4 MG PO CAPS
0.8000 mg | ORAL_CAPSULE | Freq: Every day | ORAL | Status: DC
  Administered 2021-10-19 – 2021-11-01 (×14): 0.8 mg via ORAL
  Filled 2021-10-19 (×14): qty 2

## 2021-10-19 MED ORDER — SODIUM CHLORIDE 0.9 % IV SOLN
1.0000 g | Freq: Once | INTRAVENOUS | Status: AC
  Administered 2021-10-19: 1 g via INTRAVENOUS
  Filled 2021-10-19: qty 10

## 2021-10-19 MED ORDER — SODIUM CHLORIDE 0.9 % IV SOLN
500.0000 mg | INTRAVENOUS | Status: AC
  Administered 2021-10-20 – 2021-10-21 (×2): 500 mg via INTRAVENOUS
  Filled 2021-10-19 (×2): qty 5

## 2021-10-19 MED ORDER — IOHEXOL 350 MG/ML SOLN
65.0000 mL | Freq: Once | INTRAVENOUS | Status: AC | PRN
  Administered 2021-10-19: 65 mL via INTRAVENOUS

## 2021-10-19 MED ORDER — VANCOMYCIN VARIABLE DOSE PER UNSTABLE RENAL FUNCTION (PHARMACIST DOSING): Status: DC

## 2021-10-19 MED ORDER — VANCOMYCIN HCL 2000 MG/400ML IV SOLN
2000.0000 mg | Freq: Once | INTRAVENOUS | Status: AC
  Administered 2021-10-19: 2000 mg via INTRAVENOUS
  Filled 2021-10-19: qty 400

## 2021-10-19 MED ORDER — ATORVASTATIN CALCIUM 80 MG PO TABS
80.0000 mg | ORAL_TABLET | Freq: Every day | ORAL | Status: DC
  Administered 2021-10-19 – 2021-10-31 (×13): 80 mg via ORAL
  Filled 2021-10-19 (×13): qty 1

## 2021-10-19 MED ORDER — AMLODIPINE BESYLATE 10 MG PO TABS
10.0000 mg | ORAL_TABLET | Freq: Every day | ORAL | Status: DC
  Administered 2021-10-19 – 2021-11-01 (×14): 10 mg via ORAL
  Filled 2021-10-19 (×8): qty 1
  Filled 2021-10-19: qty 2
  Filled 2021-10-19 (×5): qty 1

## 2021-10-19 MED ORDER — PREGABALIN 75 MG PO CAPS
75.0000 mg | ORAL_CAPSULE | Freq: Two times a day (BID) | ORAL | Status: DC
Start: 2021-10-19 — End: 2021-10-23
  Administered 2021-10-19 – 2021-10-22 (×7): 75 mg via ORAL
  Filled 2021-10-19 (×7): qty 1

## 2021-10-19 NOTE — Progress Notes (Signed)
I have evaluated this patient and discussed the care plan with the residents. Full H&P note to follow. 

## 2021-10-19 NOTE — ED Triage Notes (Signed)
BIB EMS from [facility] for call out "coughing up blood"  Just completed anbx for pna.  Since last night more shob.  Room air sats 80% with EMS.  Placed on 5-6 Liters with EMS.  Rhonchi throughout.  Does not normally wear O2.  Blood streaked sputum but no frank blood per EMS   20G LFA

## 2021-10-19 NOTE — Assessment & Plan Note (Addendum)
Fever of 100.8 in ED, likely pneumonia.  -UDS, UA and urine culture pending in addition to sputum and blood cultures, MRSA swab, and TB quantiferon gold pending.

## 2021-10-19 NOTE — ED Notes (Signed)
   Was going to place condom cath for accurate measurement of output d/t giving IV lasix and PO flomax, pt's penis inverted and unable to place condom cath. Pt did use urinal with only 60cc output, states he denies feeling like he needs to urinate further.

## 2021-10-19 NOTE — Assessment & Plan Note (Signed)
Chronic. On home Lyrica 75 mg daily

## 2021-10-19 NOTE — Assessment & Plan Note (Addendum)
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   - Nephrology following, appreciate care and recommendations  - Continue Lasix 80 mg PO BID - Continue phosphate binder, Phoslo 667 mg  - Continue to trend daily BMP  - Avoid nephrotoxic agents - Strict I/Os and daily weights   - Per palliative, pt is not interested in HD at this time

## 2021-10-19 NOTE — Progress Notes (Addendum)
Pharmacy Antibiotic Note  [name] is a 61 y.o. male for which pharmacy has been consulted for vancomycin dosing for pneumonia.  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 Patient presenting with hemoptysis / SOB.  SCr 2.03 - 0.74 in Feb of this year WBC 5.5; LA 1.1; T 100.1>99.4; HR 91; RR 18  Plan: Azith 500 mg x 3 days Ceftriaxone 2g q24hr Vancomycin 2000 mg once, subsequent dosing as indicated per random vancomycin level until renal function stable and/or improved, at which time scheduled dosing can be considered Trend WBC, Fever, Renal function, & Clinical course F/u cultures, clinical course, WBC, fever De-escalate when able F/u MRSA PCR     Temp (24hrs), Avg:100.5 F (38.1 C), Min:100.1 F (37.8 C), Max:100.8 F (38.2 C)  Recent Labs  Lab 10/19/21 1232  WBC 5.5  CREATININE 2.03*  LATICACIDVEN 1.1    CrCl cannot be calculated (Unknown ideal weight.).    Allergies  Allergen Reactions   Other Anaphylaxis, Itching, Swelling and Other (See Comments)    Allergy to GRITS- exact ingredient is not known    Antimicrobials this admission: vancomycin 8/25 >>  rocephin 8/25 >>  azith 8/25 >> (8/27)  Microbiology results: Pending  Thank you for allowing pharmacy to be a part of this patient's care.  Lorelei Pont, PharmD, BCPS 10/19/2021 4:23 PM ED Clinical Pharmacist -  [phone]

## 2021-10-19 NOTE — Assessment & Plan Note (Addendum)
Sacral ulcer present per SNF documentation. Unable to visualize due to inability to turn patient without multiple staff assistance. - Wound care nurse consulted, appreciate care and recs

## 2021-10-19 NOTE — Assessment & Plan Note (Signed)
Chronic  -On home amlodipine 10 mg PO daily

## 2021-10-19 NOTE — ED Provider Notes (Signed)
[name] Provider Note   CSN: [id number] Arrival date & time: 10/19/21  1216     History  Chief Complaint  Patient presents with   Shortness of Breath    [name] is a 61 y.o. male.  The history is provided by the patient and medical records. No language interpreter was used.  Shortness of Breath Severity:  Moderate Onset quality:  Gradual Duration:  2 days Timing:  Constant Progression:  Waxing and waning Chronicity:  Recurrent Context: URI   Relieved by:  Nothing Worsened by:  Nothing Ineffective treatments:  None tried Associated symptoms: cough, fever, hemoptysis, sputum production and wheezing   Associated symptoms: no abdominal pain, no chest pain, no headaches, no neck pain, no rash and no vomiting   Risk factors: hx of PE/DVT (per pt report but not seen in chart)        Home Medications Prior to Admission medications   Medication Sig Start Date End Date Taking? Authorizing Provider  acetaminophen (TYLENOL) 650 MG CR tablet Take 650 mg by mouth every 4 (four) hours as needed (for general discomfort).    [provider]  amLODipine (NORVASC) 10 MG tablet Take 0.5 tablets (5 mg total) by mouth daily. 03/12/21   Aline August, MD  aspirin 81 MG chewable tablet Chew 81 mg by mouth daily.    [provider]  atorvastatin (LIPITOR) 80 MG tablet Take 80 mg by mouth at bedtime.    [provider]  brimonidine (ALPHAGAN) 0.2 % ophthalmic solution Place 1 drop into both eyes in the morning and at bedtime. 12/20/20   Blanchie Dessert, MD  dorzolamide (TRUSOPT) 2 % ophthalmic solution Place 1 drop into both eyes 2 (two) times daily. 12/20/20   Blanchie Dessert, MD  ferrous sulfate 325 (65 FE) MG tablet Take 1 tablet (325 mg total) by mouth daily. 09/23/20   [name], [name], DO  insulin glargine-yfgn (SEMGLEE) 100 UNIT/ML Pen Inject 3 Units into the skin daily. 03/12/21   Aline August, MD   Multiple Vitamin (MULTIVITAMIN) tablet Take 1 tablet by mouth daily.    [provider]  ondansetron (ZOFRAN) 4 MG tablet Take 4 mg by mouth every 4 (four) hours as needed for nausea or vomiting.    [provider]  pantoprazole (PROTONIX) 40 MG tablet Take 40 mg by mouth daily.    [provider]  pregabalin (LYRICA) 75 MG capsule Take 75 mg by mouth 2 (two) times daily.    [provider]  saccharomyces boulardii (FLORASTOR) 250 MG capsule Take 1 capsule (250 mg total) by mouth 2 (two) times daily. 09/07/20   Antonieta Pert, MD  senna-docusate (SENOKOT-S) 8.6-50 MG tablet Take 1 tablet by mouth daily.    [provider]  sertraline (ZOLOFT) 50 MG tablet Take 50 mg by mouth at bedtime.    [provider]  Skin Protectants, Misc. ([place] MOIST BARRIER/ZINC) CREA Apply 1 application topically See admin instructions. Apply to bilateral shin, heels,left lower buttock skin tear. Place foam dressing over bilateral shins every day and night shift for wound care    [provider]  tamsulosin (FLOMAX) 0.4 MG CAPS capsule Take 0.8 mg by mouth daily.    [provider]  Vitamin D, Ergocalciferol, (DRISDOL) 1.25 MG (50000 UNIT) CAPS capsule Take 50,000 Units by mouth every 30 (thirty) days. 17th of each month    [provider]      Allergies    Other  Review of Systems   Review of Systems  Constitutional:  Positive for chills, fatigue and fever.  HENT:  Positive for congestion.   Respiratory:  Positive for cough, hemoptysis, sputum production, chest tightness, shortness of breath and wheezing. Negative for stridor.   Cardiovascular:  Negative for chest pain, palpitations and leg swelling.  Gastrointestinal:  Negative for abdominal pain, constipation, diarrhea, nausea and vomiting.  Genitourinary:  Negative for dysuria.  Musculoskeletal:  Negative for back pain, neck pain and neck stiffness.  Skin:  Negative for rash.   Neurological:  Negative for seizures, light-headedness, numbness and headaches.  Psychiatric/Behavioral:  Negative for agitation.   All other systems reviewed and are negative.   Physical Exam Updated Vital Signs BP (!) 176/90   Pulse 95   Temp (!) 100.8 F (38.2 C) (Oral)   Resp 17   SpO2 97%  Physical Exam Vitals and nursing note reviewed.  Constitutional:      General: He is not in acute distress.    Appearance: He is well-developed. He is ill-appearing. He is not toxic-appearing or diaphoretic.  HENT:     Head: Normocephalic and atraumatic.  Eyes:     Conjunctiva/sclera: Conjunctivae normal.  Cardiovascular:     Rate and Rhythm: Normal rate and regular rhythm.     Heart sounds: Murmur heard.  Pulmonary:     Effort: Pulmonary effort is normal. No tachypnea or respiratory distress.     Breath sounds: Rhonchi and rales present. No decreased breath sounds or wheezing.  Chest:     Chest wall: No tenderness.  Abdominal:     Palpations: Abdomen is soft.     Tenderness: There is no abdominal tenderness.  Musculoskeletal:        General: No swelling.     Cervical back: Neck supple.     Right lower leg: No tenderness. Edema (at his baseline he reports) present.     Left lower leg: No tenderness. Edema (at baseline per pt) present.  Skin:    General: Skin is warm and dry.     Capillary Refill: Capillary refill takes less than 2 seconds.  Neurological:     Mental Status: He is alert.  Psychiatric:        Mood and Affect: Mood normal.     ED Results / Procedures / Treatments   Labs (all labs ordered are listed, but only abnormal results are displayed) Labs Reviewed  CBC WITH DIFFERENTIAL/PLATELET - Abnormal; Notable for the following components:      Result Value   RBC 3.53 (*)    Hemoglobin 8.7 (*)    HCT 30.0 (*)    MCH 24.6 (*)    MCHC 29.0 (*)    RDW 17.7 (*)    Lymphs Abs 0.5 (*)    All other components within normal limits  COMPREHENSIVE METABOLIC PANEL -  Abnormal; Notable for the following components:   Potassium 5.8 (*)    Chloride 115 (*)    CO2 16 (*)    Glucose, Bld 122 (*)    BUN 40 (*)    Creatinine, Ser 2.03 (*)    Albumin 2.8 (*)    GFR, Estimated 37 (*)    All other components within normal limits  BRAIN NATRIURETIC PEPTIDE - Abnormal; Notable for the following components:   B Natriuretic Peptide 547.9 (*)    All other components within normal limits  TROPONIN I (HIGH SENSITIVITY) - Abnormal; Notable for the following components:   Troponin I (High Sensitivity)  20 (*)    All other components within normal limits  SARS CORONAVIRUS 2 BY RT PCR  CULTURE, BLOOD (ROUTINE X 2)  CULTURE, BLOOD (ROUTINE X 2)  URINE CULTURE  LACTIC ACID, PLASMA  LIPASE, BLOOD  LACTIC ACID, PLASMA  URINALYSIS, ROUTINE W REFLEX MICROSCOPIC  RAPID URINE DRUG SCREEN, HOSP PERFORMED  TROPONIN I (HIGH SENSITIVITY)    EKG EKG Interpretation  Date/Time:  Friday October 19 2021 12:24:58 EDT Ventricular Rate:  99 PR Interval:  172 QRS Duration: 105 QT Interval:  363 QTC Calculation: 466 R Axis:   0 Text Interpretation: Sinus rhythm Probable left atrial enlargement Anterior infarct, old Nonspecific T abnormalities, lateral leads Artifact in lead(s) I II III aVR aVL aVF V1 V2 when compared to prior, more artifact.  faster rate. No STEMI Confirmed by Antony Blackbird [phone]) on 10/19/2021 12:39:43 PM  Radiology CT Angio Chest PE W and/or Wo Contrast  Result Date: 10/19/2021 CLINICAL DATA:  New hypoxia, shortness of breath and hemoptysis. History of pulmonary embolism. Recent pneumonia. EXAM: CT ANGIOGRAPHY CHEST WITH CONTRAST TECHNIQUE: Multidetector CT imaging of the chest was performed using the standard protocol during bolus administration of intravenous contrast. Multiplanar CT image reconstructions and MIPs were obtained to evaluate the vascular anatomy. RADIATION DOSE REDUCTION: This exam was performed according to the departmental dose-optimization  program which includes automated exposure control, adjustment of the mA and/or kV according to patient size and/or use of iterative reconstruction technique. CONTRAST:  73m OMNIPAQUE IOHEXOL 350 MG/ML SOLN COMPARISON:  Portable chest obtained earlier today. Chest CTA dated 10/24/2020. FINDINGS: Cardiovascular: Enlarged heart. Normally opacified pulmonary arteries to the level of the lung bases. At the lung bases, there are extensive motion artifacts making it difficult to assess for true filling defects. No definite pulmonary emboli seen. Extensive coronary artery calcifications. Minimal thoracic aortic calcification. Mediastinum/Nodes: Heterogeneous thyroid gland with a more confluent nodule in the right lobe, measuring 2.7 cm in maximum diameter, previously 2.1 cm. A left lobe nodule is currently demonstrated measuring 1.8 cm and a left isthmus nodule measures 1.6 cm. No enlarged lymph nodes.  Unremarkable esophagus. Lungs/Pleura: Interval extensive dense, patchy airspace opacity in both lungs. This is most pronounced in the right upper lobe, followed by both lower lobes and left upper lobe. These are all somewhat dependent in distribution. There is also a small right pleural effusion and minimal left pleural effusion. Upper Abdomen: Unremarkable. Scratch at reflux of contrast into the inferior vena cava and hepatic veins. These are mildly dilated. Musculoskeletal: Extensive diffuse subcutaneous edema. Thoracic spine degenerative changes scratch at thoracic and lower cervical spine degenerative changes. Review of the MIP images confirms the above findings. IMPRESSION: 1. Motion artifacts at both lung bases with no definite pulmonary emboli seen. 2. Cardiomegaly and changes of acute congestive heart failure. 3. Extensive dense airspace opacity in both lungs, predominantly in a dependent distribution with a small right pleural effusion and minimal left pleural effusion. Differential considerations include dense  alveolar edema and pleural effusions associated with acute CHF and extensive bilateral pneumonia with associated pleural effusions. 4. Multiple thyroid nodules measuring up to 2.1 cm in maximum diameter each, with progression. Recommend thyroid [place](ref: J Am Coll Radiol. 2015 Feb;12(2): 143-50). 5.  Calcific coronary artery and aortic atherosclerosis. Aortic Atherosclerosis (ICD10-I70.0). Electronically Signed   By: SClaudie ReveringM.D.   On: 10/19/2021 14:49   DG Chest Portable 1 View  Result Date: 10/19/2021 CLINICAL DATA:  Recent pneumonia Mia, history of blood clots, it hypoxia,  shortness of breath, hemoptysis EXAM: PORTABLE CHEST 1 VIEW COMPARISON:  Portable exam 1241 hours compared to 11/18/2020 FINDINGS: Rotated to the RIGHT. Normal heart size and mediastinal contours. Patchy BILATERAL pulmonary infiltrates question pneumonia or edema, a filler hemorrhage not excluded. Small RIGHT pleural effusion. No pneumothorax. Prior cervical spine fusion. IMPRESSION: Patchy BILATERAL pulmonary infiltrates which could reflect pneumonia, pulmonary edema or alveolar hemorrhage. Small RIGHT pleural effusion. Electronically Signed   By: Lavonia Dana M.D.   On: 10/19/2021 12:51    Procedures Procedures    CRITICAL CARE Performed by: Gwenyth Allegra Karisma Meiser Total critical care time: 40 minutes Critical care time was exclusive of separately billable procedures and treating other patients. Critical care was necessary to treat or prevent imminent or life-threatening deterioration. Critical care was time spent personally by me on the following activities: development of treatment plan with patient and/or surrogate as well as nursing, discussions with consultants, evaluation of patient's response to treatment, examination of patient, obtaining history from patient or surrogate, ordering and performing treatments and interventions, ordering and review of laboratory studies, ordering and review of radiographic studies, pulse  oximetry and re-evaluation of patient's condition.   Medications Ordered in ED Medications  cefTRIAXone (ROCEPHIN) 1 g in sodium chloride 0.9 % 100 mL IVPB (has no administration in time range)  azithromycin (ZITHROMAX) 500 mg in sodium chloride 0.9 % 250 mL IVPB (has no administration in time range)  iohexol (OMNIPAQUE) 350 MG/ML injection 65 mL (65 mLs Intravenous Contrast Given 10/19/21 1429)    ED Course/ Medical Decision Making/ A&P                           Medical Decision Making Amount and/or Complexity of Data Reviewed Labs: ordered. Radiology: ordered.  Risk Prescription drug management. Decision regarding hospitalization.    [name] is a 61 y.o. male with a complex past medical history including hypertension, glaucoma, previous cocaine abuse, diabetes, hypercholesterolemia, CHF, obesity, TIA and stroke, peripheral arterial disease, left BKA, and report of recent pneumonia status post antibiotic completion who presents with fevers, chills, worsened cough, hemoptysis, shortness of breath, fatigue, and hypoxia.  According to patient, he was at [facility] and EMS was called for him having hemoptysis and worsening shortness of breath.  He reports he just completed antibiotics but is unclear what antibiotic he completed.  Oxygen saturation was 80% on room air and he does not take oxygen normally.  He required 6 L of oxygen to maintain oxygen saturations and was brought in.  Patient is denying any chest pain or abdominal pain at this time but does feel short of breath.  He does agree he has had worsened cough the last few days.  He was found to be febrile on arrival but is not tachycardic or tachypneic initially.  He denies any new leg pain or leg swelling but reports he has had a blood clot in his left leg he thinks.  He does not take any blood thinners.  He denies any nausea, vomiting, constipation, diarrhea, or urinary changes.  On my initial evaluation, patient does have  significant rhonchi and rales in lung fields.  He does have a murmur.  Chest abdomen nontender on my exam.  Pulses present in extremities and he has a left BKA.  Patient does not think he is more edematous than baseline.  EKG does not show STEMI, just a faster rate than prior with artifact.  Clinically I am concerned about  recurrent pneumonia, CHF exacerbation with his rales, rhonchi and hypoxia, or even a pulm embolism given the hemoptysis and reported previous blood clot on blood thinners.  We will get labs, imaging, and the CT PE study if his kidneys can tolerate.  Anticipate admission for his new hypoxia.  X-ray does show evidence of pneumonia.  We will still await CT PE study but will start antibiotics and he will need admission after.  BNP is also rising, will hold on significant fluid resuscitation as he is not hypoxic and at this time does not appear to meet sepsis criteria.  He does not have a white count elevation either.  Will trend troponin.  Soon as CT PE study is completed, will admit.  2:39 PM I personally viewed the images and started to interpret as pneumonia however I called and spoke with radiology who agreed they do not see evidence of acute pulmonary embolism at this time.  They agree with pneumonia with some alveolar edema likely.  We will call for medicine admission for the pneumonia.        Final Clinical Impression(s) / ED Diagnoses Final diagnoses:  Hypoxia  Community acquired pneumonia, unspecified laterality  Hemoptysis    Clinical Impression: 1. Hypoxia   2. Community acquired pneumonia, unspecified laterality   3. Hemoptysis     Disposition: Admit  This note was prepared with assistance of Systems analyst. Occasional wrong-word or sound-a-like substitutions may have occurred due to the inherent limitations of voice recognition software.     Modest Draeger, Gwenyth Allegra, MD 10/19/21 [phone]

## 2021-10-19 NOTE — ED Notes (Signed)
Per report given to this nurse, provider okay'd only 1 set of blood culture collection and D/C repeat lactate. Per previous nurse, ED provider aware of pt's low grade temp and no tyl ordered

## 2021-10-19 NOTE — Assessment & Plan Note (Addendum)
Stable without insulin.  - CBG checks qAC HS - Consider starting sliding scale insulin if sugars persistently >180  - Encourage nutrition with diabetic diet

## 2021-10-19 NOTE — Assessment & Plan Note (Addendum)
Resolved with K of 4.9 on 10/20/21, potassium was 5.8 on admission. Takes potassium supplement at home for history of hypokalemia.. - Continue to monitor with daily BMP

## 2021-10-19 NOTE — Assessment & Plan Note (Addendum)
Echo 10/21/21 revealed EF of 60-65% and Grade 2 diastolic dysfunction with moderate pulmonary hypertension, moderate aortic stenosis, and small pericardial effusion. Having good UOP. - Strict I/Os and daily weights  - Diurese per nephrology

## 2021-10-19 NOTE — ED Notes (Addendum)
This nurse in room while admitting provider in room. This nurse took temp while provider in room, temp 99.4 orally. Provider aware

## 2021-10-19 NOTE — Assessment & Plan Note (Addendum)
home Zoloft 60 mg

## 2021-10-19 NOTE — ED Notes (Signed)
This nurse checked micromedex. Azithromycin and vancomycin compatible to go through same IV, both started since pt only has 1 IV. All blood, urine, and resp swab obtained at this time

## 2021-10-19 NOTE — Assessment & Plan Note (Signed)
Chronic, lead to left BKA on 03/07/21.  - On home atorvastatin 80 mg  - [facility] at [facility], PT/OT consult placed, appreciate recs

## 2021-10-19 NOTE — Hospital Course (Addendum)
[name] is a 61 y.o. 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 His hospital course is detailed below:   Acute hypoxic respiratory failure, secondary to pneumonia and heart failure Patient presented in sepsis with pulmonary source, found to have pneumonia on CXR and chest CT angio. Blood cultures were collected and had no growth. Patient completed a day course of azithromycin and 7 day course of ceftriaxone. . Patient was also noted to have fluid overload on admission. Echo 10/21/21 revealed EF of 60-65% and Grade 2 diastolic dysfunction with moderate pulmonary HTN and small pericardial effusion. Patient required aggressive diuresis, which was managed by nephrology with Lasix and metolazone. Patient was able to transition to oral diuresis and was monitored with significant improvement in symptoms.    AKI (acute kidney injury) [facility]) Patient presented with AKI, likely from fluid overload. Patient was aggressively diuresed with IV lasix with assistance from nephrology given tenuous kidney status. Patient was not requiring dialysis while here but did require IV albumin and was placed on phosphate binder and sodium bicarb. Renal US was unremarkable. Patient required a foley due to urinary retention. Patient transitioned to oral lasix BID, which was well tolerated. Patient was able to pass a void trial without further retention prior to discharge.   Iron deficiency anemia Labs significant for iron deficiency anemia. Patient required 1 unit of PRBC. Patient received Aranesp injection and was given 4 doses of IV iron. Given concern for GI origin, patient was also started on protonix. No further transfusions were required during hospitalization. Hemoglobin stable  upon discharge  Type 2 Diabetes Patient's home insulin was discontinued upon admission as his sugars were overall well controlled. CBGs were monitored and overall well controlled.   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  Diabetes: Glucose remained between 120-180 and did not require insulin. Lyrica for diabetic neuropathy HTN: amlodipine  PAD: atorvastatin BPH: flomax  MDD: zoloft    Items for PCP Follow-Up Recommend outpatient GI follow-up for EGD and/or Colonoscopy given iron deficiency anemia and high likelihood of chronic GI bleed.  Abnormal finding on CT chest angio showing multiple thyroid nodules. Needs outpatient thyroid ultrasound. Recommend wound care for chronic sacral pressure ulcer. Palliative consult [place] discussion baseline disability and multiple acute processes. Patient was not ready for hospice and is uncertain on wishes for HD. Agreeable to outpatient palliative care referral.  Monitor CBGs as patient's home insulin was discontinued and not required during hospitalization.  Repeat BMP to monitor potassium and creatinine Will discharge on potassium 40 meq PO BID x 2 days then reduce to potassium chloride 20 meq BID after that

## 2021-10-19 NOTE — Assessment & Plan Note (Addendum)
Likely 2/2 pneumonia superimposed on HFpEF exacerbation. Imaging findings, fever, and sputum production raised concern for PNA. Patient also frankly volume overloaded with pitting edema to nipple line. Not on O2 at home, on 2L [name]. - Continue ceftriaxone Day 6/10.  - Flutter valve and incentive spirometry - Wean O2 support as tolerated and monitor closely  - PT/OT consulted for mobility evaluation and incentive, appreciate recs   - f/u blood culture x 2

## 2021-10-19 NOTE — Assessment & Plan Note (Addendum)
Pt has new LUQ pain, which could be c/f GI bleed in setting of IDA. Will CTM. - Continue IV iron x 4 doses (last dose today) - Continue 40 mg Protonix  - Nutrition consult placed  - Trend daily H&H  - s/p 1uPRBC 8/27. Threshold for transfusion <7 - Consider workup for chronic GI bleed if he requires another transfusion  - FOBT not collected yet. Hgb stable, will re-consider if Hgb drops.

## 2021-10-19 NOTE — Assessment & Plan Note (Signed)
Chronic, home Flomax ordered

## 2021-10-19 NOTE — ED Notes (Signed)
Pt had bed upstairs and this nurse gave report at 1738, was asked if pt TB rule out. Receiving nurse contacted MD and pt is TB r/o and this nurse placed pt on airborne precautions and pt will be placed instead into neg pressure room on unit. This nurse asked receiving nurse at 1754 if she was the nurse for the negative pressure room. Received reply at 1833 saying receiving nurse leaves at 7. This nurse informed charge Deneise Lever who stated to send pt d/t already giving report and having clean room. This nurse notified receiving nurse via secure chat that transport was placed per charge instructions.

## 2021-10-19 NOTE — Assessment & Plan Note (Addendum)
Recently completed a course of antibiotics for community-acquired pneumonia. Increased oxygen requirement, Chest X ray and CT confirmed bilateral patchy infiltrates with pleural effusions without pulmonary embolism.  - Started on azithromycin 500 mg IV and ceftriaxone 1g IV - Started flutter valve and incentive spirometry - Pending BMP 9pm, lactic acid  - Pending TB quantiferon gold, patient placed on airborne and contact precautions for TB rule out  - Pending lactic acid - Pending blood culture x 2, sputum culture   - Pending repeat troponin  - Pending MRSA swab, if positive switch abx to vancomycin, pharmacy consult placed

## 2021-10-19 NOTE — H&P (Addendum)
Hospital Admission History and Physical Service Pager: [phone]  Patient name: [name] Medical record number: [id number] Date of Birth: March 03, 1960 Age: 61 y.o. Gender: male  Primary Care Provider: Jodi Marble, MD Consultants: none Code Status: FULL  Preferred Emergency Contact: Brother Orlene Plum   Chief Complaint: Hemoptysis, shortness of breath   Assessment and Plan: [name] is a 61 y.o. male presenting with bilateral pneumonia superimposed on acute exacerbation of HFpEF. Differential for this patient's presentation of this includes PE, COPD exacerbation, hemothorax, GI source of hemoptysis but these are less likely due to because patient has recently been treated for pneumonia and CXR/chest CT angio findings confirm pneumonia without pulmonary embolism, and patient is significantly fluid overloaded on exam.   * Acute respiratory failure with hypoxia ([place]) Community acquired pneumonia Likely 2/2 pneumonia superimposed on HFpEF exacerbation. Imaging findings, fever, and sputum production raise concern for PNA. Patient also frankly volume overloaded with pitting edema to nipple line. Not on O2 at home. Found saturating at 80% at SNF. Placed on 6L [place] weaned down to 4 with 100% saturation.  - Treat pneumonia with azithromycin and ceftriaxone  - Adding vanc for MRSA coverage given recent abx exposure and living at SNF - MRSA swab - Flutter valve and incentive spirometry - currently requiring 4L [place] saturating at 100%, wean as tolerated and keep O2 sat greater than 92% - Pending TB quantiferon gold, patient placed on airborne and contact precautions for TB rule out  - Pending blood culture x 2, sputum culture   - PT/OT consulted for mobility evaluation and incentive, appreciate recs   (HFpEF) heart failure with preserved ejection fraction (HCC) Echo on 08/22/20 revealed EF 65-70%, left ventricular hypertrophy, Grade II diastolic dysfunction, elevated LA pressure and  dilation of LA, small pericardial effusion near right ventricle - On Lasix 40 mg at home, treat fluid overload with Lasix 40 mg IV x2, reassess in am - Repeat Echo pending  - Consider initiation of SGLT2 inhibitor to decrease future risk of hospitalization and cardiac death   AKI (acute kidney injury) ([place]) Creatinine 2.03 and GFR 37 on admission, last recorded Cr 0.74 and GFR >60 in Feb 2023. History of CKD 3a. Most likely prerenal from fluid backup into lungs and lack of perfusion to kidneys.  -Diuresis as above -Pending BMP 9 pm  Type 2 diabetes mellitus (HCC) Chronic problem. Glucose 122. On sliding scale lispro at home.  -Ordered HbA1c -Ordered BMP for 9 pm -Ordered blood glucose checks AC HS, depending on levels will start  sliding scale insulin   Hyperkalemia Potassium was 5.8 on admission. Takes potassium supplement at home for history of hypokalemia. EKG shows NSR without ST changes. - Repeat BMP 9 pm  - Repeat EKG in morning   Multiple thyroid nodules Chest CT angio revealed "multiple thyroid nodules measuring up to 2.1 cm in maximum diameter each, with progression."  -TSH pending  -Recommend follow up outpatient for thyroid ultrasound on d/c  Fever Fever of 100.8 in ED, likely 2/2 pneumonia as above.  -UDS, UA and urine culture pending in addition to sputum and blood cultures, MRSA swab, and TB quantiferon gold pending.   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  - On home atorvastatin 80 mg  - Mobile at [facility], PT/OT consult placed, appreciate recs   Pressure injury of skin Sacral ulcer present per SNF documentation. Unable to visualize due to inability to turn patient without  multiple staff assistance. - Wound care nurse consulted   BPH (benign prostatic hyperplasia) Chronic, home Flomax ordered  Anemia Hx of microcytic anemia. Hb 8.7 and Hct 30.  -Iron and TIBC, reticulocytes, ferritin, folate, vitamin B12 pending   Diabetic  neuropathy (HCC) Chronic.  -On home Lyrica 75 mg BID   MDD (major depressive disorder), recurrent episode, severe (HCC) Mood and affect currently appropriate to situation  -On home Zoloft 60 mg   Hypertension Chronic  -On home amlodipine 10 mg PO daily    FEN/GI: Thin fluid diet  VTE Prophylaxis: Lovenox 40 mg   Disposition: 3-4 days pending resolution of pneumonia and return to baseline oxygen requirement   History of Present Illness:  [name] is a 61 y.o. male presenting with hemoptysis and SOB with increasing O2 requirement. Patient came in with shortness of breath x 2 days with cough productive of blood-tinged sputum. He was brought in by EMS after being found to saturate at 80% on RA. He is not on O2 at [facility] where he stays. He reports recently being treated for pneumonia and completing an antibiotic course, unknown antibiotic.   In the ED, he was put on 6 L [place] which was weaned down to 4 L, saturating 100%. He was given 1g ceftriaxone and 500 mg azithromycin. CXR revealed bilateral patchy pulmonary infiltrates, and CT angio of the chest revealed cardiomegaly and extensive dense airspace opacities and pleural effusions bilaterally. He had a fever of 100.8 but WBC 5.5 so did not meat SIRS criteria. CBC also revealed Hb 8.7 and Hct 30. CMP revealed K 5.8, CO2 16, Glucose 122, BUN 40, Creatinine 2.03 and GFR 37. Albumin 2.8, Troponin 20, BNP 547.9. Lipase unremarkable.   Review Of Systems: Per HPI with the following additions:  Negative fever or chills, chest pain, abdominal pain, N/V/D, leg pain.  Positive SOB, cough with blood-tinged sputum, R arm swelling and edema.   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   Pertinent Past Surgical History: Right BKA Jan 2023    Remainder reviewed in history tab.  Pertinent Social History: Tobacco - none now, Former  Alcohol- none Drugs - hx of cocaine, last used 2019  Lives at [place] [facility]   Pertinent Family History: Mother died of cancer of uncertain variety Father alive and in 44s, stroke and heart disease hx   Remainder reviewed in history tab.   Important Outpatient Medications: Lasix 40 mg BID  Aspirin 81 mg once daily  Atorvastatin 80 mg once daily  Amlodipine 10 mg BID  Lispro sliding scale  Oxycodone 5 mg q 6h Lyrica 75 mg BID  Tylenol q6 PRN   Flomax once daily   Zoloft 50 mg evening once daily  Vitamin K BID  Vitamin C BID  Vitamin D once daily   Three eyedrops  Multivitamin  Zinc once daily  Stool softener once daily    Remainder reviewed in medication history.   Objective: BP (!) 159/99   Pulse 91   Temp 99.4 F (37.4 C) (Oral)   Resp 16   SpO2 100%  Exam: General: Alert and oriented x4, R sided hemiplegia  Eyes: Intact extraocular movements bilaterally. Cataracts visible in right  eye. Cardiovascular: RRR S1 S2 present, no rubs murmurs or gallops. Significantly fluid overloaded, with 2+ pitting edema in R arm and throughout trunk up to mid-chest.  Respiratory: Rhonchi and rales bilaterally in all lobes, unable to speak in full sentences due to SOB  Gastrointestinal: Abdomen soft, mildly distended, apparent body wall edema  MSK: Left BKA, R arm with 2+ pitting edema, no warmth, erythema, or streaking noted Derm: Chronic venous stasis changes on right lower leg  Neuro: 3-4 out of 5 strength on right side as post-stroke residual. No focal neurologic deficits.  Psych: Mood and affect appropriate to situation  Labs:  CBC BMET  Recent Labs  Lab 10/19/21 1232  WBC 5.5  HGB 8.7*  HCT 30.0*  PLT 247   Recent Labs  Lab 10/19/21 1232  NA 140  K 5.8*  CL 115*  CO2 16*  BUN 40*   CREATININE 2.03*  GLUCOSE 122*  CALCIUM 8.9    Pertinent additional labs  Albumin 2.8, Troponin 20, BNP 547.9. Lipase unremarkable.  EKG: My own interpretation (not copied from electronic read) Sinus rhythm without ST changes.  QT c 466  Imaging Studies Performed:  Imaging Study   Chest XRay IMPRESSION: Patchy BILATERAL pulmonary infiltrates which could reflect pneumonia, pulmonary edema or alveolar hemorrhage. My impression: Bilateral fluffy pulmonary infiltrates likely reflecting bilateral pneumonia   CT Angio Chest w/ and w/o contrast IMPRESSION: 1. Motion artifacts at both lung bases with no definite pulmonary emboli seen. 2. Cardiomegaly and changes of acute congestive heart failure. 3. Extensive dense airspace opacity in both lungs, predominantly in a dependent distribution with a small right pleural effusion and minimal left pleural effusion. Differential considerations include dense alveolar edema and pleural effusions associated with acute CHF and extensive bilateral pneumonia with associated pleural effusions. 4. Multiple thyroid nodules measuring up to 2.1 cm in maximum diameter each, with progression. Recommend thyroid US (ref: J Am Coll Radiol. 2015 Feb;12(2): 143-50). 5.  Calcific coronary artery and aortic atherosclerosis.  Lupita Raider, Medical Student 10/19/2021, 5:59 PM [place] Intern pager: [phone], text pages welcome Secure chat group [place]    I have evaluated this patient along with Student Dr. Orlena Sheldon and reviewed the above note, making necessary revisions.  Pearla Dubonnet, MD 10/19/2021, 6:11 PM PGY-2, [place]

## 2021-10-19 NOTE — Assessment & Plan Note (Signed)
Chest CT angio revealed "multiple thyroid nodules measuring up to 2.1 cm in maximum diameter each, with progression.  -TSH pending  -Recommend follow up outpatient for thyroid ultrasound on d/c

## 2021-10-20 ENCOUNTER — Inpatient Hospital Stay (HOSPITAL_COMMUNITY): Payer: Medicare HMO

## 2021-10-20 DIAGNOSIS — J189 Pneumonia, unspecified organism: Secondary | ICD-10-CM | POA: Diagnosis not present

## 2021-10-20 DIAGNOSIS — I5030 Unspecified diastolic (congestive) heart failure: Secondary | ICD-10-CM | POA: Diagnosis not present

## 2021-10-20 DIAGNOSIS — N179 Acute kidney failure, unspecified: Secondary | ICD-10-CM

## 2021-10-20 DIAGNOSIS — A419 Sepsis, unspecified organism: Secondary | ICD-10-CM | POA: Diagnosis not present

## 2021-10-20 LAB — BLOOD CULTURE ID PANEL (REFLEXED) - BCID2

## 2021-10-20 LAB — IRON AND TIBC
Iron: 12 ug/dL — ABNORMAL LOW (ref 45–182)
Saturation Ratios: 5 % — ABNORMAL LOW (ref 17.9–39.5)
TIBC: 227 ug/dL — ABNORMAL LOW (ref 250–450)
UIBC: 215 ug/dL

## 2021-10-20 LAB — BASIC METABOLIC PANEL
Anion gap: 7 (ref 5–15)
BUN: 43 mg/dL — ABNORMAL HIGH (ref 6–20)
CO2: 16 mmol/L — ABNORMAL LOW (ref 22–32)
Calcium: 8.3 mg/dL — ABNORMAL LOW (ref 8.9–10.3)
Chloride: 117 mmol/L — ABNORMAL HIGH (ref 98–111)
Creatinine, Ser: 2.2 mg/dL — ABNORMAL HIGH (ref 0.61–1.24)
GFR, Estimated: 33 mL/min — ABNORMAL LOW (ref >60)
Glucose, Bld: 151 mg/dL — ABNORMAL HIGH (ref 70–99)
Potassium: 5 mmol/L (ref 3.5–5.1)
Sodium: 140 mmol/L (ref 135–145)

## 2021-10-20 LAB — COMPREHENSIVE METABOLIC PANEL
ALT: 15 U/L (ref 0–44)
AST: 10 U/L — ABNORMAL LOW (ref 15–41)
Albumin: 2 g/dL — ABNORMAL LOW (ref 3.5–5.0)
Alkaline Phosphatase: 91 U/L (ref 38–126)
Anion gap: 6 (ref 5–15)
BUN: 42 mg/dL — ABNORMAL HIGH (ref 6–20)
CO2: 16 mmol/L — ABNORMAL LOW (ref 22–32)
Calcium: 8.4 mg/dL — ABNORMAL LOW (ref 8.9–10.3)
Chloride: 118 mmol/L — ABNORMAL HIGH (ref 98–111)
Creatinine, Ser: 2.06 mg/dL — ABNORMAL HIGH (ref 0.61–1.24)
GFR, Estimated: 36 mL/min — ABNORMAL LOW (ref >60)
Glucose, Bld: 96 mg/dL (ref 70–99)
Potassium: 4.9 mmol/L (ref 3.5–5.1)
Sodium: 140 mmol/L (ref 135–145)
Total Bilirubin: 0.5 mg/dL (ref 0.3–1.2)
Total Protein: 6 g/dL — ABNORMAL LOW (ref 6.5–8.1)

## 2021-10-20 LAB — FOLATE: Folate: 6.9 ng/mL (ref >5.9)

## 2021-10-20 LAB — RETICULOCYTES
Immature Retic Fract: 25 % — ABNORMAL HIGH (ref 2.3–15.9)
RBC.: 2.57 MIL/uL — ABNORMAL LOW (ref 4.22–5.81)
Retic Count, Absolute: 67.8 10*3/uL (ref 19.0–186.0)
Retic Ct Pct: 2.6 % (ref 0.4–3.1)

## 2021-10-20 LAB — GLUCOSE, CAPILLARY
Glucose-Capillary: 84 mg/dL (ref 70–99)
Glucose-Capillary: 134 mg/dL — ABNORMAL HIGH (ref 70–99)
Glucose-Capillary: 151 mg/dL — ABNORMAL HIGH (ref 70–99)
Glucose-Capillary: 124 mg/dL — ABNORMAL HIGH (ref 70–99)

## 2021-10-20 LAB — FERRITIN: Ferritin: 164 ng/mL (ref 24–336)

## 2021-10-20 LAB — VITAMIN B12: Vitamin B-12: 439 pg/mL (ref 180–914)

## 2021-10-20 LAB — TROPONIN I (HIGH SENSITIVITY): Troponin I (High Sensitivity): 35 ng/L — ABNORMAL HIGH (ref <18)

## 2021-10-20 LAB — MAGNESIUM: Magnesium: 1.9 mg/dL (ref 1.7–2.4)

## 2021-10-20 MED ORDER — VANCOMYCIN HCL IN DEXTROSE 1-5 GM/200ML-% IV SOLN
1000.0000 mg | INTRAVENOUS | Status: DC
  Filled 2021-10-20: qty 200

## 2021-10-20 MED ORDER — FUROSEMIDE 10 MG/ML IJ SOLN
40.0000 mg | Freq: Four times a day (QID) | INTRAMUSCULAR | Status: AC
  Administered 2021-10-20 (×2): 40 mg via INTRAVENOUS
  Filled 2021-10-20 (×2): qty 4

## 2021-10-20 MED ORDER — LINEZOLID 600 MG/300ML IV SOLN
600.0000 mg | Freq: Two times a day (BID) | INTRAVENOUS | Status: DC
  Administered 2021-10-20 – 2021-10-21 (×4): 600 mg via INTRAVENOUS
  Filled 2021-10-20 (×5): qty 300

## 2021-10-20 NOTE — Evaluation (Addendum)
Physical Therapy Evaluation & Discharge Patient Details Name: [name] MRN: [id number] DOB: November 25, 1960 Today's Date: 10/20/2021  History of Present Illness  61 y/o male presented to ED on 10/19/21 for coughing up blood and SOB. Admitted for acute respiratory failure 2/2 PNA. 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  Clinical Impression  Patient admitted with the above. PTA, patient living at [facility] and total care with use of hoyer lift for OOB. Patient requires totalA+2 for bed mobility. Patient is currently at baseline. No further skilled PT needs identified acutely. Recommend return to LTC with no follow up therapy. PT will sign off.        Recommendations for follow up therapy are one component of a multi-disciplinary discharge planning process, led by the attending physician.  Recommendations may be updated based on patient status, additional functional criteria and insurance authorization.  Follow Up Recommendations Long-term institutional care without follow-up therapy Can patient physically be transported by private vehicle: No    Assistance Recommended at Discharge Frequent or constant Supervision/Assistance  Patient can return home with the following       Equipment Recommendations None recommended by PT  Recommendations for Other Services       Functional Status Assessment Patient has not had a recent decline in their functional status     Precautions / Restrictions Precautions Precautions: Fall Precaution Comments: hx of L BKA      Mobility  Bed Mobility Overal bed mobility: Needs Assistance Bed Mobility: Rolling Rolling: Total assist, +2 for physical assistance, +2 for safety/equipment         General bed mobility comments: totalA+2 for all aspects    Transfers                        Ambulation/Gait                   Stairs            Wheelchair Mobility    Modified Rankin (Stroke Patients Only)       Balance                                             Pertinent Vitals/Pain Pain Assessment Pain Assessment: No/denies pain    Home Living Family/patient expects to be discharged to:: Skilled nursing facility                   Additional Comments: resides at [facility]    Prior Function Prior Level of Function : Needs assist             Mobility Comments: pt reports staff assists with any mobility, uses hoyer lift to get out of bed ADLs Comments: pt reports he is able to feed himself, but is otherwise dependent on staff for any ADLs     Hand Dominance        Extremity/Trunk Assessment   Upper Extremity Assessment Upper Extremity Assessment: Defer to OT evaluation    Lower Extremity Assessment Lower Extremity Assessment: Generalized weakness;LLE deficits/detail (grossly 1/5) LLE Deficits / Details: L BKA       Communication   Communication: No difficulties  Cognition Arousal/Alertness: Awake/alert Behavior During Therapy: WFL for tasks assessed/performed Overall Cognitive Status:  No family/caregiver present to determine baseline cognitive functioning                                          General Comments      Exercises     Assessment/Plan    PT Assessment Patient does not need any further PT services  PT Problem List         PT Treatment Interventions      PT Goals (Current goals can be found in the Care Plan section)  Acute Rehab PT Goals Patient Stated Goal: did not state PT Goal Formulation: All assessment and education complete, DC therapy    Frequency       Co-evaluation PT/OT/SLP Co-Evaluation/Treatment: Yes Reason for Co-Treatment: To address functional/ADL transfers;For patient/therapist safety;Complexity of the patient's impairments (multi-system involvement) PT  goals addressed during session: Mobility/safety with mobility         AM-PAC PT "6 Clicks" Mobility  Outcome Measure Help needed turning from your back to your side while in a flat bed without using bedrails?: Total Help needed moving from lying on your back to sitting on the side of a flat bed without using bedrails?: Total Help needed moving to and from a bed to a chair (including a wheelchair)?: Total Help needed standing up from a chair using your arms (e.g., wheelchair or bedside chair)?: Total Help needed to walk in hospital room?: Total Help needed climbing 3-5 steps with a railing? : Total 6 Click Score: 6    End of Session Equipment Utilized During Treatment: Oxygen Activity Tolerance: Patient tolerated treatment well Patient left: in bed;with call bell/phone within reach;with bed alarm set Nurse Communication: Mobility status PT Visit Diagnosis: Muscle weakness (generalized) (M62.81)    Time: 9476-5465 PT Time Calculation (min) (ACUTE ONLY): 16 min   Charges:   PT Evaluation $PT Eval Moderate Complexity: 1 Mod          Alajiah Dutkiewicz A. Gilford Rile PT, DPT Acute Rehabilitation Services Office [phone]   Linna Hoff 10/20/2021, 10:25 AM

## 2021-10-20 NOTE — Progress Notes (Addendum)
Daily Progress Note Intern Pager: [phone]  Patient name: [name] Medical record number: [id number] Date of birth: 02-Sep-1960 Age: 61 y.o. Gender: male  Primary Care Provider: Jodi Marble, MD Consultants: none Code Status: FULL  Pt Overview and Major Events to Date:  8/25 - admitted   Assessment and Plan: [name] is a 61 y.o. male with a PMH of CHF, DM2, HTN, TIA, hx DVT and left BKA, bipolar disorder, and MDD admitted for bilateral pneumonia superimposed on acute exacerbation of HFpEF. Patient is significantly fluid overloaded on exam so we are diuresing while treating pneumonia.   * Sepsis ([place]) 2/2 bacteremia and pneumonia. Mets SIRS criteria with HR >90 and fever 100.8 yesterday and suspected source of infection as blood cultures came back positive for staphylococcus species. Also has murmur 1/6 systolic on cardiac exam.  - Pending TTE, consider TEE based on results to investigate for endocarditis given fever, murmur, and staph bacteremia  - Continue Azithro + CTX + vancomycin coverage for staph species  - Repeat cultures ordered, awaiting speciation and susceptibilities from initial cultures - UA unremarkable; UDS and urine culture pending in addition to sputum cultures, MRSA swab, and TB quantiferon gold pending.   (HFpEF) heart failure with preserved ejection fraction ([place]) Echo on 08/22/20 revealed EF 65-70%, left ventricular hypertrophy, Grade II diastolic dysfunction, elevated LA pressure and dilation of LA, small pericardial effusion near right ventricle. EKG showed NSR without ST changes, Troponin 20 > 38 > flat at 35. BNP 547 elevated from baseline.  - On Lasix 40 mg at home, continue fluid overload with Lasix 40 mg IV x2, will redose x2 today - Repeat electrolytes 5 pm - Repeat Echo pending  - Strict I/Os and daily weights  - Consider initiation of SGLT2 inhibitor to decrease future risk of hospitalization and cardiac death   Acute respiratory  failure with hypoxia ([place]) 2/2 pneumonia superimposed on HFpEF exacerbation. Imaging findings, fever, and sputum production raise concern for PNA. Patient also frankly volume overloaded with pitting edema to nipple line. Not on O2 at home. Found saturating at 80% at SNF. Placed on 5-6L [place] weaned [name] to 2 with 100% saturation.  - Continue azithromycin and ceftriaxone  - Continue vanc for MRSA coverage given recent abx exposure and living at SNF - MRSA swab pending - Flutter valve and incentive spirometry - currently requiring 2L [place] saturating at 100%, wean as tolerated and monitor closely - Pending TB quantiferon gold, patient placed on airborne and contact precautions for TB rule out  - Pending blood culture x 2, sputum culture   - PT/OT consulted for mobility evaluation and incentive, appreciate recs   Hyperkalemia Resolved with K of 4.9 on 10/20/21, potassium was 5.8 on admission. Takes potassium supplement at home for history of hypokalemia. EKG shows NSR without ST changes. - Continue to monitor with daily BMP - BMP at 5p today  Pressure injury of skin Appears to be stage I, photo in media tab - Wound care nurse consulted   AKI (acute kidney injury) ([place]) Acute on chronic kidney disease 3a. Creatinine 2.03 > 2.06 and GFR 37 > 36, last recorded Cr 0.74 and GFR >60 in Feb 2023. History of CKD 3a. Most likely prerenal from fluid backup into lungs and lack of perfusion to kidneys.  -Diuresis as above -Continue to trend daily BMP  - Avoid nephrotoxic agents  - Strict I/Os and daily weights   Iron deficiency anemia Hx of microcytic anemia.  Hb 8.7 and Hct 30.  - Iron 12, TIBC 227, reticulocytes WNL, ferritin 164 WNL, folate 6.9 WNL, vitamin B12 439 WNL  - Restart iron supplementation at discharge - Threshold for transfusion <7  Type 2 diabetes mellitus (HCC) Chronic problem. Glucose 122 > 96 > 84. On sliding scale lispro at home.  -Pending HbA1c -Ordered blood glucose checks AC HS,  depending on levels will start sliding scale insulin, currently holding because levels <90  - Encourage nutrition with diabetic diet   Multiple thyroid nodules Chest CT angio revealed "multiple thyroid nodules measuring up to 2.1 cm in maximum diameter each, with progression.  -TSH 1.541 WNL  -Recommend follow up outpatient for thyroid ultrasound on d/c  Chronic Stable Conditions PAD- continue atorva 80 HTN- continue amlodipine 10 BPH- continue flomax Neuropathy- continue lyrica MDD- Zoloft   FEN/GI: Regular PPx: Lovenox 40 mg  Dispo:SNF in 2-3 days. Barriers include fluid overload and pneumonia IV treatment.   Subjective:  Patient was seen at bedside. He is still complaining of SOB but I noticed his nasal cannula was off to the side of his nose and I adjusted it. He is on 2L [place] saturating 100%. He is still fluid overloaded 2+ pitting edema in extremities and up to nipples. On lung ascultation, rales and rhonchi are improving, not as audible as they were yesterday. He denies chest pain, abdominal pain, N/V/D.   Objective: Temp:  [97.9 F (36.6 C)-100.8 F (38.2 C)] 97.9 F (36.6 C) (08/26 0827) Pulse Rate:  [69-97] 80 (08/26 0827) Resp:  [14-19] 18 (08/26 0827) BP: (130-179)/(76-133) 156/89 (08/26 0827) SpO2:  [97 %-100 %] 100 % (08/26 0827) Weight:  [938.1 kg] 119.2 kg (08/26 0500)  Physical Exam: General: Alert and oriented x4, R sided hemiplegia, afebrile overnight  Eyes: Intact extraocular movements bilaterally. Cataracts visible in right eye, right eye visual impairment.  Cardiovascular: RRR S1 S2 present, murmur 1/6 systolic, significantly fluid overloaded, with 2+ pitting edema in R arm and throughout trunk up to mid-chest.  Respiratory: Rhonchi and rales bilaterally in all lobes, unable to speak in full sentences due to SOB  MSK: Left BKA, R arm with 2+ pitting edema, no warmth, erythema, or streaking noted  Laboratory: Most recent CBC Lab Results  Component Value  Date   WBC 5.5 10/19/2021   HGB 8.7 (L) 10/19/2021   HCT 30.0 (L) 10/19/2021   MCV 85.0 10/19/2021   PLT 247 10/19/2021   Most recent BMP    Latest Ref Rng & Units 10/20/2021    4:49 AM  BMP  Glucose 70 - 99 mg/dL 96   BUN 6 - 20 mg/dL 42   Creatinine 0.61 - 1.24 mg/dL 2.06   Sodium 135 - 145 mmol/L 140   Potassium 3.5 - 5.1 mmol/L 4.9   Chloride 98 - 111 mmol/L 118   CO2 22 - 32 mmol/L 16   Calcium 8.9 - 10.3 mg/dL 8.4    Albumin 2.8, Troponin 20 > 35, BNP 547.9. Lipase unremarkable. TSH,  Imaging/Diagnostic Tests:  EKG Sinus rhythm without ST changes QT c 466   Chest XRay IMPRESSION: Patchy BILATERAL pulmonary infiltrates which could reflect pneumonia, pulmonary edema or alveolar hemorrhage. My impression: Bilateral fluffy pulmonary infiltrates likely reflecting bilateral pneumonia    CT Angio Chest w/ and w/o contrast IMPRESSION: 1. Motion artifacts at both lung bases with no definite pulmonary emboli seen. 2. Cardiomegaly and changes of acute congestive heart failure. 3. Extensive dense airspace opacity in both lungs, predominantly in  a dependent distribution with a small right pleural effusion and minimal left pleural effusion. Differential considerations include dense alveolar edema and pleural effusions associated with acute CHF and extensive bilateral pneumonia with associated pleural effusions. 4. Multiple thyroid nodules measuring up to 2.1 cm in maximum diameter each, with progression. Recommend thyroid US (ref: J Am Coll Radiol. 2015 Feb;12(2): 143-50). 5.  Calcific coronary artery and aortic atherosclerosis.   Joselyn Glassman, Medical Student 10/20/2021, 11:52 AM  [place] Intern pager: [phone], text pages welcome Secure chat group [place]   I have evaluated this patient along with Student Dr. Orlena Sheldon and reviewed the above note, making necessary revisions.  Pearla Dubonnet, MD 10/20/2021,  11:56 AM PGY-2, [place]

## 2021-10-20 NOTE — Assessment & Plan Note (Addendum)
S/p Azithro x3day and CTX x7days.

## 2021-10-20 NOTE — Progress Notes (Signed)
PHARMACY - PHYSICIAN COMMUNICATION CRITICAL VALUE ALERT - BLOOD CULTURE IDENTIFICATION (BCID)  [name] is an 61 y.o. male who presented to [facility] on 10/19/2021  Assessment:  2/2 (same set) GPC, BCID staph species - could be contaminant.   Name of physician (or Provider) Contacted:   Current antibiotics: Vancomycin, ceftriaxone, azithromycin  Changes to prescribed antibiotics recommended:  Patient is on recommended antibiotics - No changes needed  No results found for this or any previous visit.  Cristela Felt, PharmD, BCPS Clinical Pharmacist 10/20/2021 10:34 AM

## 2021-10-20 NOTE — Progress Notes (Addendum)
Pharmacy Antibiotic Note  [name] is a 61 y.o. male admitted on 10/19/2021 with pneumonia.  Pharmacy has been consulted for Vancomycin dosing. Received vancomycin '2000mg'$  IV x1 8/25. AKI on admission, Scr continues to trend up now at 2.2 (bsl ~0.7-1.2). Per discussion with FM team, will transition to linezolid to avoid further nephrotoxicity.   Plan: DC vancomycin, start linezolid '600mg'$  IV Q12h  Ceftriaxone 2g Q24h, Azithromycin '500mg'$  Q24h also continued Trend WBC, fever, renal function F/u cultures, clinical progress, levels as indicated De-escalate when able   Weight: 119.2 kg (262 lb 12.6 oz)  Temp (24hrs), Avg:99.1 F (37.3 C), Min:97.9 F (36.6 C), Max:100.1 F (37.8 C)  Recent Labs  Lab 10/19/21 1232 10/19/21 1840 10/20/21 0449  WBC 5.5  --   --   CREATININE 2.03* 1.97* 2.06*  LATICACIDVEN 1.1  --   --     Estimated Creatinine Clearance: 47.8 mL/min (A) (by C-G formula based on SCr of 2.06 mg/dL (H)).    Allergies  Allergen Reactions   Other Anaphylaxis, Itching, Swelling and Other (See Comments)    Allergy to GRITS- exact ingredient is not known    Antimicrobials this admission: Ceftriaxone 8/25 >>  Azithromycin 8/25  >>  Vancomycin 8/25 >>   Microbiology results: 8/25 BCx: 2/2 GPCs (staph species) - prelim 8/25 UCx: pending  8/25 MRSA: pending   Thank you for allowing pharmacy to be a part of this patient's care.  Ardyth Harps, PharmD Clinical Pharmacist

## 2021-10-20 NOTE — Evaluation (Signed)
Occupational Therapy Evaluation Patient Details Name: [name] MRN: [id number] DOB: 1960-06-15 Today's Date: 10/20/2021   History of Present Illness 61 y/o male presented to ED on 10/19/21 for coughing up blood and SOB. Admitted for acute respiratory failure 2/2 PNA. 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   Clinical Impression   Patient admitted for the diagnosis above.  PTA he resides at a local SNF, and requires total assist for mobility at w/c level, and ADL completion except for self feeding and light grooming.  No functional changes noted compared to baseline.  No acute needs identified.  Recommend a return to SNF with assist as needed.        Recommendations for follow up therapy are one component of a multi-disciplinary discharge planning process, led by the attending physician.  Recommendations may be updated based on patient status, additional functional criteria and insurance authorization.   Follow Up Recommendations  Long-term institutional care without follow-up therapy    Assistance Recommended at Discharge Frequent or constant Supervision/Assistance  Patient can return home with the following A lot of help with bathing/dressing/bathroom;Direct supervision/assist for medications management;Assistance with feeding    Functional Status Assessment  Patient has not had a recent decline in their functional status  Equipment Recommendations  None recommended by OT    Recommendations for Other Services       Precautions / Restrictions Precautions Precautions: Fall Precaution Comments: hx of L BKA Restrictions Weight Bearing Restrictions: No      Mobility Bed Mobility Overal bed mobility: Needs Assistance Bed Mobility: Rolling Rolling: Total assist, +2 for physical assistance, +2 for safety/equipment              Transfers                    General transfer comment: hoyer lift at baseline      Balance Overall balance assessment: History of Falls                                         ADL either performed or assessed with clinical judgement   ADL Overall ADL's : At baseline                                             Vision Baseline Vision/History: 2 Legally blind Patient Visual Report: No change from baseline       Perception Perception Perception: Not tested   Praxis Praxis Praxis: Not tested    Pertinent Vitals/Pain Pain Assessment Pain Assessment: No/denies pain     Hand Dominance Right   Extremity/Trunk Assessment Upper Extremity Assessment Upper Extremity Assessment: RUE deficits/detail RUE Deficits / Details: hemiparesis to R arm, very swollen.  Trace AROM noted elbow distal, unable to use the arm functionally. RUE: Subluxation noted RUE Sensation: decreased light touch RUE Coordination: decreased fine motor;decreased gross motor   Lower Extremity Assessment Lower Extremity Assessment: Defer to PT evaluation LLE Deficits / Details: L BKA   Cervical / Trunk Assessment Cervical / Trunk Assessment: Kyphotic   Communication Communication Communication: No difficulties   Cognition Arousal/Alertness: Awake/alert Behavior During Therapy: Flat affect Overall Cognitive Status: No family/caregiver present to  determine baseline cognitive functioning                                       General Comments   VSS    Exercises     Shoulder Instructions      Home Living Family/patient expects to be discharged to:: Skilled nursing facility                                 Additional Comments: resides at [name]      Prior Functioning/Environment Prior Level of Function : Needs assist             Mobility Comments: pt reports staff assists with any mobility, uses hoyer lift to get out of  bed ADLs Comments: pt reports he is able to feed himself, but is otherwise dependent on staff for any ADLs        OT Problem List: Decreased strength;Decreased range of motion;Impaired balance (sitting and/or standing);Impaired UE functional use;Increased edema      OT Treatment/Interventions:      OT Goals(Current goals can be found in the care plan section) Acute Rehab OT Goals Patient Stated Goal: Return to SNF OT Goal Formulation: With patient Time For Goal Achievement: 10/22/21 Potential to Achieve Goals: Good  OT Frequency:      Co-evaluation PT/OT/SLP Co-Evaluation/Treatment: Yes Reason for Co-Treatment: Complexity of the patient's impairments (multi-system involvement);For patient/therapist safety PT goals addressed during session: Mobility/safety with mobility OT goals addressed during session: ADL's and self-care;Strengthening/ROM      AM-PAC OT "6 Clicks" Daily Activity     Outcome Measure Help from another person eating meals?: A Lot Help from another person taking care of personal grooming?: A Lot Help from another person toileting, which includes using toliet, bedpan, or urinal?: Total Help from another person bathing (including washing, rinsing, drying)?: Total Help from another person to put on and taking off regular upper body clothing?: Total Help from another person to put on and taking off regular lower body clothing?: Total 6 Click Score: 8   End of Session Equipment Utilized During Treatment: Oxygen Nurse Communication: Mobility status  Activity Tolerance: Patient tolerated treatment well Patient left: in bed;with call bell/phone within reach  OT Visit Diagnosis: Hemiplegia and hemiparesis Hemiplegia - Right/Left: Right Hemiplegia - dominant/non-dominant: Dominant Hemiplegia - caused by: Cerebral infarction                Time: 7616-0737 OT Time Calculation (min): 17 min Charges:  OT General Charges $OT Visit: 1 Visit OT Evaluation $OT Eval  Moderate Complexity: 1 Mod  10/20/2021  RP, OTR/L  Acute Rehabilitation Services  Office:  [phone]   [name] 10/20/2021, 10:51 AM

## 2021-10-21 ENCOUNTER — Inpatient Hospital Stay (HOSPITAL_COMMUNITY): Payer: Medicare HMO

## 2021-10-21 DIAGNOSIS — D509 Iron deficiency anemia, unspecified: Secondary | ICD-10-CM | POA: Diagnosis not present

## 2021-10-21 DIAGNOSIS — I5030 Unspecified diastolic (congestive) heart failure: Secondary | ICD-10-CM | POA: Diagnosis not present

## 2021-10-21 DIAGNOSIS — I5033 Acute on chronic diastolic (congestive) heart failure: Secondary | ICD-10-CM | POA: Diagnosis not present

## 2021-10-21 DIAGNOSIS — A419 Sepsis, unspecified organism: Secondary | ICD-10-CM | POA: Diagnosis not present

## 2021-10-21 LAB — CBC
HCT: 20.8 % — ABNORMAL LOW (ref 39.0–52.0)
Hemoglobin: 6.4 g/dL — CL (ref 13.0–17.0)
MCH: 25.3 pg — ABNORMAL LOW (ref 26.0–34.0)
MCHC: 30.8 g/dL (ref 30.0–36.0)
MCV: 82.2 fL (ref 80.0–100.0)
Platelets: 195 10*3/uL (ref 150–400)
RBC: 2.53 MIL/uL — ABNORMAL LOW (ref 4.22–5.81)
RDW: 17.4 % — ABNORMAL HIGH (ref 11.5–15.5)
WBC: 4.2 10*3/uL (ref 4.0–10.5)
nRBC: 0 % (ref 0.0–0.2)

## 2021-10-21 LAB — HEMOGLOBIN AND HEMATOCRIT, BLOOD
HCT: 21.5 % — ABNORMAL LOW (ref 39.0–52.0)
HCT: 23.7 % — ABNORMAL LOW (ref 39.0–52.0)
Hemoglobin: 6.6 g/dL — CL (ref 13.0–17.0)
Hemoglobin: 7.4 g/dL — ABNORMAL LOW (ref 13.0–17.0)

## 2021-10-21 LAB — BASIC METABOLIC PANEL
Anion gap: 7 (ref 5–15)
Anion gap: 8 (ref 5–15)
BUN: 45 mg/dL — ABNORMAL HIGH (ref 6–20)
BUN: 44 mg/dL — ABNORMAL HIGH (ref 6–20)
CO2: 16 mmol/L — ABNORMAL LOW (ref 22–32)
CO2: 15 mmol/L — ABNORMAL LOW (ref 22–32)
Calcium: 7.9 mg/dL — ABNORMAL LOW (ref 8.9–10.3)
Calcium: 7.8 mg/dL — ABNORMAL LOW (ref 8.9–10.3)
Chloride: 116 mmol/L — ABNORMAL HIGH (ref 98–111)
Chloride: 113 mmol/L — ABNORMAL HIGH (ref 98–111)
Creatinine, Ser: 2.19 mg/dL — ABNORMAL HIGH (ref 0.61–1.24)
Creatinine, Ser: 2.42 mg/dL — ABNORMAL HIGH (ref 0.61–1.24)
GFR, Estimated: 34 mL/min — ABNORMAL LOW (ref >60)
GFR, Estimated: 30 mL/min — ABNORMAL LOW (ref >60)
Glucose, Bld: 124 mg/dL — ABNORMAL HIGH (ref 70–99)
Glucose, Bld: 163 mg/dL — ABNORMAL HIGH (ref 70–99)
Potassium: 4.6 mmol/L (ref 3.5–5.1)
Potassium: 4.7 mmol/L (ref 3.5–5.1)
Sodium: 139 mmol/L (ref 135–145)
Sodium: 136 mmol/L (ref 135–145)

## 2021-10-21 LAB — ECHOCARDIOGRAM COMPLETE
AR max vel: 1.19 cm2
AV Area VTI: 1.27 cm2
AV Area mean vel: 1.21 cm2
AV Mean grad: 17 mmHg
AV Peak grad: 31.6 mmHg
Ao pk vel: 2.81 m/s
Area-P 1/2: 3.72 cm2
S' Lateral: 3.3 cm
Weight: 4243.41 oz

## 2021-10-21 LAB — URINE CULTURE

## 2021-10-21 LAB — HEMOGLOBIN A1C
Hgb A1c MFr Bld: 5.8 % — ABNORMAL HIGH (ref 4.8–5.6)
Mean Plasma Glucose: 119.76 mg/dL

## 2021-10-21 LAB — GLUCOSE, CAPILLARY
Glucose-Capillary: 105 mg/dL — ABNORMAL HIGH (ref 70–99)
Glucose-Capillary: 132 mg/dL — ABNORMAL HIGH (ref 70–99)
Glucose-Capillary: 140 mg/dL — ABNORMAL HIGH (ref 70–99)
Glucose-Capillary: 132 mg/dL — ABNORMAL HIGH (ref 70–99)

## 2021-10-21 LAB — MAGNESIUM: Magnesium: 1.9 mg/dL (ref 1.7–2.4)

## 2021-10-21 LAB — PREPARE RBC (CROSSMATCH)

## 2021-10-21 MED ORDER — SODIUM CHLORIDE 0.9% IV SOLUTION: Freq: Once | INTRAVENOUS | Status: AC

## 2021-10-21 MED ORDER — FERROUS SULFATE 325 (65 FE) MG PO TABS
325.0000 mg | ORAL_TABLET | ORAL | Status: DC
  Administered 2021-10-21 – 2021-10-25 (×3): 325 mg via ORAL
  Filled 2021-10-21 (×5): qty 1

## 2021-10-21 MED ORDER — EMPAGLIFLOZIN 10 MG PO TABS
10.0000 mg | ORAL_TABLET | Freq: Every day | ORAL | Status: DC
  Administered 2021-10-21 – 2021-10-22 (×2): 10 mg via ORAL
  Filled 2021-10-21 (×2): qty 1

## 2021-10-21 MED ORDER — FUROSEMIDE 10 MG/ML IJ SOLN
40.0000 mg | Freq: Two times a day (BID) | INTRAMUSCULAR | Status: DC
  Administered 2021-10-21: 40 mg via INTRAVENOUS
  Filled 2021-10-21: qty 4

## 2021-10-21 MED ORDER — FUROSEMIDE 10 MG/ML IJ SOLN
40.0000 mg | Freq: Two times a day (BID) | INTRAMUSCULAR | Status: DC
  Administered 2021-10-22: 40 mg via INTRAVENOUS
  Filled 2021-10-21: qty 4

## 2021-10-21 MED ORDER — FUROSEMIDE 10 MG/ML IJ SOLN
80.0000 mg | Freq: Once | INTRAMUSCULAR | Status: AC
  Administered 2021-10-21: 80 mg via INTRAVENOUS
  Filled 2021-10-21: qty 8

## 2021-10-21 NOTE — Progress Notes (Signed)
Daily Progress Note Intern Pager: [phone]  Patient name: [name] Medical record number: [id number] Date of birth: 1960/06/12 Age: 61 y.o. Gender: male  Primary Care Provider: Jodi Marble, MD Consultants: Cardiology Code Status: Full  Pt Overview and Major Events to Date:  8/25 - admitted   Assessment and Plan:  [name] is a 61 y.o. male with a PMH of CHF, DM2, HTN, TIA, hx DVT and left BKA, bipolar disorder, and MDD admitted for bilateral pneumonia superimposed on acute exacerbation of HFpEF. Patient is significantly fluid overloaded on exam so we are diuresing while treating pneumonia.   * Sepsis ([place]) 2/2 bacteremia and pneumonia, mets SIRS criteria, blood cultures came back positive for staphylococcus species. Also has murmur 1/6 systolic on cardiac exam. UA unremarkable, Ucx w/ multiple species concerning for contaminant. MRSA swab, sputum culture, quantiferon still pending. TTE scheduled for today.  - F/u TTE, consider TEE based on results to investigate for endocarditis given fever, murmur, and staph bacteremia  - Continue Azithro + CTX + linezolid  - F/u repeat cultures, awaiting susceptibilities from initial cultures - recollect urine culture **-**  -f/u sputum culture, quantiferon, MRSA swab  (HFpEF) heart failure with preserved ejection fraction (HCC) Echo on 08/22/20 revealed EF 65-70%, left ventricular hypertrophy, Grade II diastolic dysfunction, elevated LA pressure and dilation of LA, small pericardial effusion near right ventricle. EKG showed NSR without ST changes, Troponin 20 > 38 > flat at 35. BNP 547 elevated from baseline. 1.57 L UOP.   - Consult Cardiology, appreciate recs - On Lasix 40 mg at home, continue Lasix 40 mg IV x 2 - Repeat electrolytes 2 pm - Repeat Echo pending  - Strict I/Os and daily weights  - Consider initiation of SGLT2 inhibitor to decrease future risk of hospitalization and cardiac death   Acute respiratory failure  with hypoxia ([place]) Likely 2/2 pneumonia superimposed on HFpEF exacerbation. Imaging findings, fever, and sputum production raise concern for PNA. Patient also frankly volume overloaded with pitting edema to nipple line. Not on O2 at home, found saturating at 80% at [facility]. On 3L LFNC, afebrile overnight.  - Continue azithromycin, ceftriaxone, linezolid  - f/u MRSA swab - Flutter valve and incentive spirometry - Wean O2 support as tolerated and monitor closely, Sats > 92% - f/u TB quantiferon gold, patient placed on airborne and contact precautions for TB rule out  - f/u blood culture x 2, sputum culture   - PT/OT consulted for mobility evaluation and incentive, appreciate recs   Hyperkalemia Resolved with K of 4.9 on 10/20/21, potassium was 5.8 on admission. Takes potassium supplement at home for history of hypokalemia. K 4.8 today. - Continue to monitor with daily BMP  Pressure injury of skin Sacral ulcer present per SNF documentation. Unable to visualize due to inability to turn patient without multiple staff assistance. - Wound care nurse consulted   AKI (acute kidney injury) ([place]) Acute on chronic kidney disease 3a. Baseline last recorded Cr 0.74 and GFR >60 in Feb 2023. Creatinine stable today at 2.19 (2.20), GFR 34 (33). Output 1.57 L yesterday, positive ~1 L from admission. Most likely prerenal from HF and lack of perfusion to kidneys. Continue to diurese.  -Diuresis as above -Continue to trend daily BMP  - Avoid nephrotoxic agents  - Strict I/Os and daily weights   Iron deficiency anemia Hx of microcytic anemia. Hb 8.7 and Hct 30 at baseline, 6.4 this am, asymptomatic. - 1 unit PRBC  -  post transfusion h/h - Begin every other day iron 325 mg PO with breakfast, restart home vitamin C  - Threshold for transfusion <7  Type 2 diabetes mellitus (HCC) Chronic problem.On sliding scale lispro at home. Glucose rang 84-151 yesterday. A1c 8.4 03/01/21. -Pending HbA1c -CBG checks AC HS,  -  Consider starting sliding scale insulin, if sugars inappropriate  - Encourage nutrition with diabetic diet   Multiple thyroid nodules Chest CT angio revealed "multiple thyroid nodules measuring up to 2.1 cm in maximum diameter each, with progression.  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  - On home atorvastatin 80 mg  - Mobile at SNF, PT/OT consult placed, appreciate recs   BPH (benign prostatic hyperplasia) Chronic, home Flomax ordered  Diabetic neuropathy (HCC) Chronic.  -On home Lyrica 75 mg BID   MDD (major depressive disorder), recurrent episode, severe (HCC) Mood and affect currently appropriate to situation  -On home Zoloft 60 mg   Hypertension Chronic  -On home amlodipine 10 mg PO daily   Fever-resolved as of 10/20/2021 Fever of 100.8 in ED, likely pneumonia.      FEN/GI: Regular PPx: Lovenox 40 mg Dispo:SNF in 2-3 days. Barriers include fluid overload and pneumonia IV treatment.   Subjective:  Patient reports SOB this morning, but has no complaints of pain and feels ok.  Objective: Temp:  [98.3 F (36.8 C)-98.9 F (37.2 C)] 98.3 F (36.8 C) (08/27 1147) Pulse Rate:  [72-81] 78 (08/27 1147) Resp:  [14-18] 14 (08/27 1147) BP: (133-148)/(77-88) 142/78 (08/27 1147) SpO2:  [98 %-100 %] 98 % (08/27 1147) Weight:  [120.3 kg] 120.3 kg (08/27 0545) Physical Exam: General: Ill appearing, man, laying in bed, NAD Cardiovascular: RRR, NRG, Systolic murmur, 2+ pitting edema halfway up right arm/chest Respiratory: CTABL, coarse breath sounds in base of lungs Abdomen: Soft, NTTP, non-distended MSK: LBKA, 2+ pitting edema in BLE  Laboratory: Most recent CBC Lab Results  Component Value Date   WBC 4.2 10/21/2021   HGB 6.6 (LL) 10/21/2021   HCT 21.5 (L) 10/21/2021   MCV 82.2 10/21/2021   PLT 195 10/21/2021   Most recent BMP    Latest Ref Rng  & Units 10/21/2021    2:34 AM  BMP  Glucose 70 - 99 mg/dL 124   BUN 6 - 20 mg/dL 45   Creatinine 0.61 - 1.24 mg/dL 2.19   Sodium 135 - 145 mmol/L 139   Potassium 3.5 - 5.1 mmol/L 4.6   Chloride 98 - 111 mmol/L 116   CO2 22 - 32 mmol/L 16   Calcium 8.9 - 10.3 mg/dL 7.9      Holley Bouche, MD 10/21/2021, 2:21 PM  PGY-2, [place] Intern pager: [phone], text pages welcome Secure chat group [place]

## 2021-10-21 NOTE — Consult Note (Signed)
Rule Nurse Consult Note: Reason for Consult:Stage 2 pressure injury.  Patient with healed area of pressure injury to sacrum with no return of pigment to area. Remains at increased risk for injury to this area due to decreased tensile strength of tissue plus moisture (perspiration). The photo placed in the EHR is appreciated. Wound type:Pressure Pressure Injury POA: Yes Measurement:N/A Wound bed:N/A Drainage (amount, consistency, odor) N/A Periwound:maceration Dressing procedure/placement/frequency: Patient with increased risk for skin injury in the area of a previously healed pressure injury, left LE amputation alters seated posture. I have provided a mattress replacement with low air loss feature for decreased friction and microclimate management as well as provided Nursing with guidance for the placement of a silicone foam dressing to the affected area. Turning and repositioning is in place and will continue; I have provided guidance for minimizing time in the supine position. A pressure redistribution heel boot is provided for the right LE.  A pressure redistributing chair cushion is provided for the patient's use when OOB in the chair, both here in house and and after discharge.  [place] nursing team will not follow, but will remain available to this patient, the nursing and medical teams.  Please re-consult if needed.  Thank you for inviting [place] to participate in this patient's Plan of Care.  Maudie Flakes, MSN, RN, CNS, [place], Serita Grammes, [employer], [employer] phone:  [phone]

## 2021-10-21 NOTE — Consult Note (Addendum)
CARDIOLOGY CONSULT NOTE  Patient ID: [name] MRN: [id number] DOB/AGE: Jan 27, 1961 61 y.o.  Admit date: 10/19/2021 Referring Physician: Family medicine Reason for Consultation:  Heart failure  HPI:   61 year old African-American male with hypertension, type 2 diabetes mellitus, h/o stroke, CAD, HFpEF, COPD, h/o left DVT, h/o Lt BKA following necrotizing infection and nom healing wound, bipolar disorder, admitted with acute hypoxic respiratory failure.  Patient has been seen by our practice several times inpatient, but he has never been seen outpatient. He is currently at a nursing facility. He was admitted on 10/19/2021 with worsening shortness of breath, hemoptysis. He was found to be in acute hypoxic respiratory failure. He is being treated for possible pneumonia, also being worked up for atypical infections, including TB. Cardiology consulted for management of possible congestive heart failure.     Past Medical History:  Diagnosis Date   Arthritis    Bipolar affective ([place])    CHF (congestive heart failure) ([place])    Claustrophobia    Severe   Depression    Diabetes mellitus    uncontrolled    Heart murmur    Hypertension      Past Surgical History:  Procedure Laterality Date   ABDOMINAL AORTOGRAM W/LOWER EXTREMITY Left 03/02/2021   Procedure: ABDOMINAL AORTOGRAM W/LOWER EXTREMITY;  Surgeon: Cherre Robins, MD;  Location: [facility];  Service: Cardiovascular;  Laterality: Left;   AMPUTATION Left 03/07/2021   Procedure: LEFT BELOW KNEE AMPUTATION;  Surgeon: Cherre Robins, MD;  Location: [place];  Service: Vascular;  Laterality: Left;   APPLICATION OF WOUND VAC Left 03/07/2021   Procedure: APPLICATION OF WOUND VAC;  Surgeon: Cherre Robins, MD;  Location: [place];  Service: Vascular;  Laterality: Left;   BOWEL DECOMPRESSION N/A 01/19/2020   Procedure: BOWEL DECOMPRESSION;  Surgeon: Otis Brace, MD;  Location: [place];  Service: Gastroenterology;  Laterality:  N/A;   BOWEL DECOMPRESSION N/A 11/19/2020   Procedure: BOWEL DECOMPRESSION;  Surgeon: Wilford Corner, MD;  Location: [facility];  Service: Endoscopy;  Laterality: N/A;   BOWEL DECOMPRESSION N/A 12/03/2020   Procedure: BOWEL DECOMPRESSION;  Surgeon: Ronnette Juniper, MD;  Location: [facility];  Service: Gastroenterology;  Laterality: N/A;   COLONOSCOPY WITH PROPOFOL N/A 01/19/2020   Procedure: COLONOSCOPY WITH PROPOFOL;  Surgeon: Otis Brace, MD;  Location: [place];  Service: Gastroenterology;  Laterality: N/A;   COLONOSCOPY WITH PROPOFOL N/A 11/19/2020   Procedure: COLONOSCOPY WITH PROPOFOL;  Surgeon: Wilford Corner, MD;  Location: [facility];  Service: Endoscopy;  Laterality: N/A;   COLONOSCOPY WITH PROPOFOL N/A 12/03/2020   Procedure: COLONOSCOPY WITH PROPOFOL;  Surgeon: Ronnette Juniper, MD;  Location: [facility];  Service: Gastroenterology;  Laterality: N/A;   KNEE SURGERY     b/l knees; pending total knee    RADIOLOGY WITH ANESTHESIA N/A 07/04/2020   Procedure: MRI WITH ANESTHESIA BRAIN WITHOUT CONTRAST AND HEAD WITHOUT CONTRAST;  Surgeon: Radiologist, Medication, MD;  Location: [facility];  Service: Radiology;  Laterality: N/A;   TRANSMETATARSAL AMPUTATION Left 03/01/2021   Procedure: TRANSMETATARSAL AMPUTATION;  Surgeon: Trula Slade, DPM;  Location: [facility];  Service: Podiatry;  Laterality: Left;      Family History  Problem Relation Age of Onset   Diabetes Other    Cancer Mother        uterus; deceased    Heart attack Mother 34   Hypertension Other    Heart attack Father 23   Stroke Sister        12/2012 age  47 y.o     Social History: Social History   Socioeconomic History   Marital status: Single    Spouse name: Not on file   Number of children: Not on file   Years of education: Not on file   Highest education level: Not on file  Occupational History   Not on file  Tobacco Use   Smoking status: Former   Smokeless tobacco: Never  Vaping Use   Vaping Use:  Never used  Substance and Sexual Activity   Alcohol use: No   Drug use: Not Currently    Types: Cocaine    Comment: Last use 2019   Sexual activity: Yes    Birth control/protection: Condom  Other Topics Concern   Not on file  Social History Narrative   Moved from [place]   Denies smoking, drinking   +cocaine history (denies recent use)   Originally from [place]    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[employer]   No kids, married                      Social Determinants of Radio broadcast assistant Strain: Not on file  Food Insecurity: No Food Insecurity (07/10/2020)   Hunger Vital Sign    Worried About [place] in the Last Year: Never true    [place] in the Last Year: Never true  Transportation Needs: No Transportation Needs (07/10/2020)   PRAPARE - Hydrologist (Medical): No    Lack of Transportation (Non-Medical): No  Physical Activity: Not on file  Stress: Not on file  Social Connections: Not on file  Intimate Partner Violence: Not on file     Medications Prior to Admission  Medication Sig Dispense Refill Last Dose   acetaminophen (TYLENOL) 650 MG CR tablet Take 650 mg by mouth every 4 (four) hours as needed (for general discomfort).      amLODipine (NORVASC) 10 MG tablet Take 0.5 tablets (5 mg total) by mouth daily.      aspirin 81 MG chewable tablet Chew 81 mg by mouth daily.      atorvastatin (LIPITOR) 80 MG tablet Take 80 mg by mouth at bedtime.      brimonidine (ALPHAGAN) 0.2 % ophthalmic solution Place 1 drop into both eyes in the morning and at bedtime. 5 mL 12    dorzolamide (TRUSOPT) 2 % ophthalmic solution Place 1 drop into both eyes 2 (two) times daily. 10 mL 12    ferrous sulfate 325 (65 FE) MG tablet Take 1 tablet (325 mg total) by mouth daily.  3    insulin glargine-yfgn (SEMGLEE) 100 UNIT/ML Pen Inject 3 Units into the skin daily.      Multiple Vitamin  (MULTIVITAMIN) tablet Take 1 tablet by mouth daily.      ondansetron (ZOFRAN) 4 MG tablet Take 4 mg by mouth every 4 (four) hours as needed for nausea or vomiting.      pantoprazole (PROTONIX) 40 MG tablet Take 40 mg by mouth daily.      pregabalin (LYRICA) 75 MG capsule Take 75 mg by mouth 2 (two) times daily.      saccharomyces boulardii (FLORASTOR) 250 MG capsule Take 1 capsule (250 mg total) by mouth 2 (two) times daily.      senna-docusate (SENOKOT-S) 8.6-50 MG tablet Take 1 tablet by mouth daily.      sertraline (ZOLOFT) 50  MG tablet Take 50 mg by mouth at bedtime.      Skin Protectants, Misc. ([place] MOIST BARRIER/ZINC) CREA Apply 1 application topically See admin instructions. Apply to bilateral shin, heels,left lower buttock skin tear. Place foam dressing over bilateral shins every day and night shift for wound care      tamsulosin (FLOMAX) 0.4 MG CAPS capsule Take 0.8 mg by mouth daily.      Vitamin D, Ergocalciferol, (DRISDOL) 1.25 MG (50000 UNIT) CAPS capsule Take 50,000 Units by mouth every 30 (thirty) days. 17th of each month       Review of Systems  Cardiovascular:  Positive for dyspnea on exertion and leg swelling. Negative for chest pain, palpitations and syncope.      Physical Exam: Physical Exam Vitals and nursing note reviewed.  Constitutional:      General: He is not in acute distress. Neck:     Vascular: No JVD.  Cardiovascular:     Rate and Rhythm: Normal rate and regular rhythm.     Heart sounds: Normal heart sounds. No murmur heard. Pulmonary:     Effort: Pulmonary effort is normal. No respiratory distress.     Breath sounds: Normal breath sounds. No wheezing or rales.  Musculoskeletal:     Comments: Left BKA Rt leg 2+ edema Nonhealing wound back of Rt calf Chronic skin changes Rt leg due to edema  Skin:    Comments: Nonhealing wound Rt calf        Imaging/tests reviewed and independently interpreted: Lab Results: CT chest 10/19/2021: 1. Motion  artifacts at both lung bases with no definite pulmonary emboli seen. 2. Cardiomegaly and changes of acute congestive heart failure. 3. Extensive dense airspace opacity in both lungs, predominantly in a dependent distribution with a small right pleural effusion and minimal left pleural effusion. Differential considerations include dense alveolar edema and pleural effusions associated with acute CHF and extensive bilateral pneumonia with associated pleural effusions. 4. Multiple thyroid nodules measuring up to 2.1 cm in maximum diameter each, with progression. Recommend thyroid US (ref: J Am Coll Radiol. 2015 Feb;12(2): 143-50). 5.  Calcific coronary artery and aortic atherosclerosis.   Aortic Atherosclerosis (ICD10-I70.0).  Cardiac Studies:  Telemetry 10/21/2021: Brief SVT No other significant arrhythmia   EKG 10/19/2021: Sinus rhythm Probable left atrial enlargement Anterior infarct, old Nonspecific T abnormalities, lateral leads  Echocardiogram 10/21/2021:  1. Left ventricular ejection fraction, by estimation, is 60 to 65%. The  left ventricle has normal function. The left ventricle has no regional  wall motion abnormalities. Left ventricular diastolic parameters are  consistent with Grade II diastolic dysfunction (pseudonormalization).  Elevated left ventricular end-diastolic pressure. The E/e' is 7.   2. Right ventricular systolic function is normal. The right ventricular  size is normal. There is moderately elevated pulmonary artery systolic  pressure. The estimated right ventricular systolic pressure is 32.9 mmHg.   3. Left atrial size was moderately dilated.   4. A small pericardial effusion is present. The pericardial effusion is  circumferential. There is no evidence of cardiac tamponade.   5. The mitral valve is abnormal. Mild mitral valve regurgitation.   6. The aortic valve is tricuspid. Aortic valve regurgitation is trivial.  Moderate aortic valve stenosis. Aortic  valve area, by VTI measures 1.27  cm. Aortic valve mean gradient measures 17.0 mmHg. Aortic valve Vmax  measures 2.81 m/s.   7. The inferior vena cava is dilated in size with <50% respiratory  variability, suggesting right atrial pressure of 15 mmHg.  Comparison(s): Changes from prior study are noted. 08/22/2020: LVEF 65-70%,  grade 2 DD, moderate LAE, small pericardial effusion - the effusion  appears unchanged.    Assessment & Recommendations:  61 year old African-American male with hypertension, type 2 diabetes mellitus, h/o stroke, CAD, anemia, HFpEF, h/o left DVT, h/o lt BKA, bipolar disorder, admitted with acute hypoxic respiratory failure.  Acute hypoxic respiratory failure: Multifactorial, including acute on chronic HFpEF Etiology likely hypertension, type 2 DM, amyloidosis less likely. Management remains primarily diuresis at this time.  Worsening renal function will be a hindrance.  Recommend cautions addition of SGLT2i-Jardiance 10 mg. Unable to add ARNI/MRA due to renal dysfunction. Resume IV lasix at 40 mg bid.  Strict I/O, daily weights Overall prognosis remains guarded given HFpEF with multiple comorbidities  Management of rest of medical comorbidities as per the primary team.   Discussed interpretation of tests and management recommendations with the primary team     Nigel Mormon, MD Pager: [phone] Office: [phone]

## 2021-10-21 NOTE — Progress Notes (Signed)
FMTS Interim Progress Note  S: Patient denies feeling lightheaded this morning.   O: BP 135/80 (BP Location: Left Arm)   Pulse 81   Temp 98.5 F (36.9 C) (Oral)   Resp 18   Wt 119.2 kg   SpO2 99%   BMI 39.96 kg/m   General: 61 year old male, lying in bed, appears tired Neck: JVD present Cardio: RRR, normal S1/S2 Lungs: Rhonchorous sounds throughout, breathing comfortably on 2 L Extremities: Left BKA, right LE with pitting edema present  A/P: Patient's hemoglobin this morning low at 6.4, repeat of 6.6.  Transfusion threshold 7 however is worried about fluid overload as patient is admitted for pneumonia with exacerbation of HFpEF and appears fluid overloaded on exam. Will transfuse and give lasix BID. -1 unit pRBCs -Lasix 40 mg IV BID today -afternoon BMP -Will ask day team to place cardiology consult  Precious Gilding, DO 10/21/2021, 5:58 AM PGY-2, [facility] pager [phone]

## 2021-10-21 NOTE — Progress Notes (Signed)
   10/21/21 0357  Provider Notification  Provider Name/Title Dr. Adah Salvage  Date Provider Notified 10/21/21  Time Provider Notified [phone]  Method of Notification Page  Notification Reason Critical result  Test performed and critical result hgb 6.4  hct 30.8  Date Critical Result Received 10/21/21  Provider response Other (Comment);See new orders (Will hold on transfusing PRBC as this time -)  Date of Provider Response 10/21/21  Time of Provider Response 0414   Will do repeat H&H stat.  Will continue to monitor patient.  Earleen Reaper RN

## 2021-10-21 NOTE — Progress Notes (Signed)
  Echocardiogram 2D Echocardiogram has been performed.  [name] 10/21/2021, 12:30 PM

## 2021-10-22 DIAGNOSIS — N179 Acute kidney failure, unspecified: Secondary | ICD-10-CM | POA: Diagnosis not present

## 2021-10-22 DIAGNOSIS — A419 Sepsis, unspecified organism: Secondary | ICD-10-CM | POA: Diagnosis not present

## 2021-10-22 DIAGNOSIS — I5030 Unspecified diastolic (congestive) heart failure: Secondary | ICD-10-CM | POA: Diagnosis not present

## 2021-10-22 LAB — CBC
HCT: 24 % — ABNORMAL LOW (ref 39.0–52.0)
Hemoglobin: 7.6 g/dL — ABNORMAL LOW (ref 13.0–17.0)
MCH: 25.7 pg — ABNORMAL LOW (ref 26.0–34.0)
MCHC: 31.7 g/dL (ref 30.0–36.0)
MCV: 81.1 fL (ref 80.0–100.0)
Platelets: 201 10*3/uL (ref 150–400)
RBC: 2.96 MIL/uL — ABNORMAL LOW (ref 4.22–5.81)
RDW: 16.9 % — ABNORMAL HIGH (ref 11.5–15.5)
WBC: 3.9 10*3/uL — ABNORMAL LOW (ref 4.0–10.5)
nRBC: 0 % (ref 0.0–0.2)

## 2021-10-22 LAB — BASIC METABOLIC PANEL
Anion gap: 9 (ref 5–15)
BUN: 45 mg/dL — ABNORMAL HIGH (ref 6–20)
CO2: 15 mmol/L — ABNORMAL LOW (ref 22–32)
Calcium: 7.8 mg/dL — ABNORMAL LOW (ref 8.9–10.3)
Chloride: 115 mmol/L — ABNORMAL HIGH (ref 98–111)
Creatinine, Ser: 2.87 mg/dL — ABNORMAL HIGH (ref 0.61–1.24)
GFR, Estimated: 24 mL/min — ABNORMAL LOW (ref >60)
Glucose, Bld: 121 mg/dL — ABNORMAL HIGH (ref 70–99)
Potassium: 4.2 mmol/L (ref 3.5–5.1)
Sodium: 139 mmol/L (ref 135–145)

## 2021-10-22 LAB — BPAM RBC
Blood Product Expiration Date: 202309302359
ISSUE DATE / TIME: 202308270837
Unit Type and Rh: 5100

## 2021-10-22 LAB — TYPE AND SCREEN
ABO/RH(D): O POS
Antibody Screen: NEGATIVE
Unit division: 0

## 2021-10-22 LAB — CULTURE, BLOOD (ROUTINE X 2): Special Requests: ADEQUATE

## 2021-10-22 LAB — HEMOGLOBIN A1C
Hgb A1c MFr Bld: 6.2 % — ABNORMAL HIGH (ref 4.8–5.6)
Mean Plasma Glucose: 131 mg/dL

## 2021-10-22 LAB — GLUCOSE, CAPILLARY
Glucose-Capillary: 122 mg/dL — ABNORMAL HIGH (ref 70–99)
Glucose-Capillary: 116 mg/dL — ABNORMAL HIGH (ref 70–99)
Glucose-Capillary: 170 mg/dL — ABNORMAL HIGH (ref 70–99)
Glucose-Capillary: 181 mg/dL — ABNORMAL HIGH (ref 70–99)

## 2021-10-22 MED ORDER — BRIMONIDINE TARTRATE 0.2 % OP SOLN
1.0000 [drp] | Freq: Two times a day (BID) | OPHTHALMIC | Status: DC
  Administered 2021-10-22 – 2021-11-01 (×20): 1 [drp] via OPHTHALMIC
  Filled 2021-10-22: qty 5

## 2021-10-22 MED ORDER — ALBUMIN HUMAN 25 % IV SOLN
25.0000 g | Freq: Four times a day (QID) | INTRAVENOUS | Status: AC
  Administered 2021-10-22 – 2021-10-23 (×3): 25 g via INTRAVENOUS
  Filled 2021-10-22 (×2): qty 100

## 2021-10-22 MED ORDER — DORZOLAMIDE HCL-TIMOLOL MAL 2-0.5 % OP SOLN
1.0000 [drp] | Freq: Two times a day (BID) | OPHTHALMIC | Status: DC
  Administered 2021-10-22 – 2021-11-01 (×20): 1 [drp] via OPHTHALMIC
  Filled 2021-10-22: qty 10

## 2021-10-22 MED ORDER — METOLAZONE 5 MG PO TABS
5.0000 mg | ORAL_TABLET | Freq: Every day | ORAL | Status: DC
Start: 2021-10-22 — End: 2021-10-25
  Administered 2021-10-22 – 2021-10-24 (×3): 5 mg via ORAL
  Filled 2021-10-22 (×4): qty 1

## 2021-10-22 MED ORDER — SODIUM BICARBONATE 650 MG PO TABS
1300.0000 mg | ORAL_TABLET | Freq: Two times a day (BID) | ORAL | Status: DC
  Administered 2021-10-22 – 2021-11-01 (×18): 1300 mg via ORAL
  Filled 2021-10-22 (×21): qty 2

## 2021-10-22 MED ORDER — SODIUM BICARBONATE 650 MG PO TABS
650.0000 mg | ORAL_TABLET | Freq: Two times a day (BID) | ORAL | Status: DC
  Administered 2021-10-22: 650 mg via ORAL
  Filled 2021-10-22: qty 1

## 2021-10-22 MED ORDER — VITAMIN C 500 MG PO TABS
500.0000 mg | ORAL_TABLET | Freq: Two times a day (BID) | ORAL | Status: DC
Start: 2021-10-22 — End: 2021-11-01
  Administered 2021-10-22 – 2021-11-01 (×21): 500 mg via ORAL
  Filled 2021-10-22 (×21): qty 1

## 2021-10-22 MED ORDER — LATANOPROST 0.005 % OP SOLN
1.0000 [drp] | Freq: Every day | OPHTHALMIC | Status: DC
  Administered 2021-10-22 – 2021-10-31 (×10): 1 [drp] via OPHTHALMIC
  Filled 2021-10-22: qty 2.5

## 2021-10-22 MED ORDER — DEXTROSE 5 % IV SOLN
100.0000 mg | Freq: Two times a day (BID) | INTRAVENOUS | Status: DC
Start: 2021-10-22 — End: 2021-10-24
  Administered 2021-10-22 – 2021-10-24 (×4): 100 mg via INTRAVENOUS
  Filled 2021-10-22 (×5): qty 10

## 2021-10-22 NOTE — Consult Note (Signed)
[facility]phrology Consultation Note  Requesting MD: Dr. Andrena Mews Reason for consult: AKI  HPI:  [name] is a 61 y.o. male with past medical history of hypertension, diabetes, TIA, DVT, peripheral vascular disease with left BKA, bipolar mood disorder, CHF who was admitted on 8/25 for shortness of breath and fluid overload seen as a consultation for the management of acute kidney injury.  The patient was thought to have pneumonia as well as acute CHF exacerbation.  He has been receiving IV diuretics without much response.  He is on ceftriaxone and azithromycin for the management of pneumonia.  Currently on IV Lasix 40 mg twice a day with urine output of around 290 cc in 24 hours.  He is net positive by 2 L.  It seems like he has fluctuation in serum creatinine level with multiple episode of AKI in the past.  It seems like his baseline serum creatinine level is around 1.6 however he has had episodes of we will lower creatinine level.  On admission the creatinine level was 2.03 which was gradually increased to 2.87 today.  He is currently requiring around 2 L of oxygen with not much improvement in his breathing. He is also acidotic. Seen by cardiologist thought to be fluid overload and continued on IV Lasix.  The patient received IV contrast on 8/25 with CT angio.  The echocardiogram showed a EF of 60 to 76%, grade 2 diastolic dysfunction and pulmonary hypertension. The home medications include iron, furosemide 80 mg twice a day, amlodipine and Flomax.  The patient is complaining of shortness of breath, fatigue and increased cough.  He denies nausea, vomiting, dysgeusia, chest pain.  PMHx:   Past Medical History:  Diagnosis Date   Arthritis    Bipolar affective ([place])    CHF (congestive heart failure) ([place])    Claustrophobia    Severe   Depression    Diabetes mellitus    uncontrolled    Heart murmur    Hypertension     Past Surgical History:  Procedure  Laterality Date   ABDOMINAL AORTOGRAM W/LOWER EXTREMITY Left 03/02/2021   Procedure: ABDOMINAL AORTOGRAM W/LOWER EXTREMITY;  Surgeon: Cherre Robins, MD;  Location: [facility];  Service: Cardiovascular;  Laterality: Left;   AMPUTATION Left 03/07/2021   Procedure: LEFT BELOW KNEE AMPUTATION;  Surgeon: Cherre Robins, MD;  Location: [facility];  Service: Vascular;  Laterality: Left;   APPLICATION OF WOUND VAC Left 03/07/2021   Procedure: APPLICATION OF WOUND VAC;  Surgeon: Cherre Robins, MD;  Location: [facility];  Service: Vascular;  Laterality: Left;   BOWEL DECOMPRESSION N/A 01/19/2020   Procedure: BOWEL DECOMPRESSION;  Surgeon: Otis Brace, MD;  Location: [facility];  Service: Gastroenterology;  Laterality: N/A;   BOWEL DECOMPRESSION N/A 11/19/2020   Procedure: BOWEL DECOMPRESSION;  Surgeon: Wilford Corner, MD;  Location: [facility];  Service: Endoscopy;  Laterality: N/A;   BOWEL DECOMPRESSION N/A 12/03/2020   Procedure: BOWEL DECOMPRESSION;  Surgeon: Ronnette Juniper, MD;  Location: [facility];  Service: Gastroenterology;  Laterality: N/A;   COLONOSCOPY WITH PROPOFOL N/A 01/19/2020   Procedure: COLONOSCOPY WITH PROPOFOL;  Surgeon: Otis Brace, MD;  Location: [facility];  Service: Gastroenterology;  Laterality: N/A;   COLONOSCOPY WITH PROPOFOL N/A 11/19/2020   Procedure: COLONOSCOPY WITH PROPOFOL;  Surgeon: Wilford Corner, MD;  Location[facility];  Service: Endoscopy;  Laterality: N/A;   COLONOSCOPY WITH PROPOFOL N/A 12/03/2020   Procedure: COLONOSCOPY WITH PROPOFOL;  Surgeon: Ronnette Juniper, MD;  [facility];  Service: Gastroenterology;  Laterality: N/A;   KNEE SURGERY     b/l knees; pending total knee    RADIOLOGY WITH ANESTHESIA N/A 07/04/2020   Procedure: MRI WITH ANESTHESIA BRAIN WITHOUT CONTRAST AND HEAD WITHOUT CONTRAST;  Surgeon: Radiologist, Medication, MD;  Location: [facility];  Service: Radiology;  Laterality: N/A;   TRANSMETATARSAL AMPUTATION Left  03/01/2021   Procedure: TRANSMETATARSAL AMPUTATION;  Surgeon: Trula Slade, DPM;  Location: [facility];  Service: Podiatry;  Laterality: Left;    Family Hx:  Family History  Problem Relation Age of Onset   Diabetes Other    Cancer Mother        uterus; deceased    Heart attack Mother 69   Hypertension Other    Heart attack Father 61   Stroke Sister        12/2012 age 44 y.o    Social History:  reports that he has quit smoking. He has never used smokeless tobacco. He reports that he does not currently use drugs after having used the following drugs: Cocaine. He reports that he does not drink alcohol.  Allergies:  Allergies  Allergen Reactions   Other Anaphylaxis, Itching, Swelling and Other (See Comments)    Grits - swelling    Medications: Prior to Admission medications   Medication Sig Start Date End Date Taking? Authorizing Provider  acetaminophen (TYLENOL) 650 MG CR tablet Take 650 mg by mouth every 4 (four) hours as needed (general discomfort).   Yes [provider]  albuterol (VENTOLIN HFA) 108 (90 Base) MCG/ACT inhaler Inhale 2 puffs into the lungs every 6 (six) hours as needed for wheezing or shortness of breath.   Yes [provider]  amLODipine (NORVASC) 10 MG tablet Take 0.5 tablets (5 mg total) by mouth daily. Patient taking differently: Take 10 mg by mouth 2 (two) times daily. 03/12/21  Yes Aline August, MD  ascorbic acid (VITAMIN C) 500 MG tablet Take 500 mg by mouth 2 (two) times daily.   Yes [provider]  aspirin 81 MG chewable tablet Chew 81 mg by mouth daily.   Yes [provider]  atorvastatin (LIPITOR) 80 MG tablet Take 80 mg by mouth at bedtime.   Yes [provider]  brimonidine (ALPHAGAN) 0.15 % ophthalmic solution Place 1 drop into both eyes 2 (two) times daily.   Yes [provider]  brimonidine (ALPHAGAN) 0.2 % ophthalmic solution Place 1 drop into both eyes 2 (two) times daily.   Yes [provider]  docusate sodium (COLACE) 100 MG capsule Take 100 mg by mouth at bedtime.   Yes [provider]  dorzolamide (TRUSOPT) 2 % ophthalmic solution Place 1 drop into both eyes 2 (two) times daily. 12/20/20  Yes Plunkett, Loree Fee, MD  dorzolamide-timolol (COSOPT) 22.3-6.8 MG/ML ophthalmic solution Place 1 drop into both eyes 2 (two) times daily.   Yes [provider]  ferrous sulfate 325 (65 FE) MG tablet Take 1 tablet (325 mg total) by mouth daily. 09/23/20  Yes [name], [name], DO  furosemide (LASIX) 40 MG tablet Take 40 mg by mouth 2 (two) times daily.   Yes [provider]  Insulin Glargine (BASAGLAR KWIKPEN) 100 UNIT/ML Inject 3 Units into the skin 2 (two) times daily.   Yes [provider]  insulin lispro (HUMALOG) 100 UNIT/ML KwikPen Inject 0-10 Units into the skin See admin instructions. Inject 0-10 units three times daily before meals per sliding scale: CBG 70-130  : 0  units CBG 131-180: 2 units CBG 181-240: 4 units CBG 241-300: 6 units CBG 301-350: 8 units CBG 351-400: 10 units   Yes [provider]  latanoprost (XALATAN) 0.005 % ophthalmic solution Place 1 drop into both eyes at bedtime.   Yes [provider]  Multiple Vitamin (MULTIVITAMIN) tablet Take 1 tablet by mouth daily.   Yes [provider]  ondansetron (ZOFRAN) 4 MG tablet Take 4 mg by mouth every 4 (four) hours as needed for nausea or vomiting.   Yes [provider]  oxyCODONE (OXY IR/ROXICODONE) 5 MG immediate release tablet Take 5 mg by mouth every 6 (six) hours as needed (left leg pain).   Yes [provider]  potassium chloride SA (KLOR-CON M) 20 MEQ tablet Take 20 mEq by mouth 2 (two) times daily.   Yes [provider]  pregabalin (LYRICA) 75 MG capsule Take 75 mg by mouth 2 (two) times daily.   Yes [provider]  PROTEIN PO Take 30 mLs by mouth 2 (two) times daily.   Yes [provider]   saccharomyces boulardii (FLORASTOR) 250 MG capsule Take 1 capsule (250 mg total) by mouth 2 (two) times daily. 09/07/20  Yes Antonieta Pert, MD  sertraline (ZOLOFT) 50 MG tablet Take 50 mg by mouth every evening.   Yes [provider]  tamsulosin (FLOMAX) 0.4 MG CAPS capsule Take 0.8 mg by mouth every evening.   Yes [provider]  Vitamin D, Ergocalciferol, (DRISDOL) 1.25 MG (50000 UNIT) CAPS capsule Take 50,000 Units by mouth every 30 (thirty) days. 17th of each month   Yes [provider]  zinc sulfate 220 (50 Zn) MG capsule Take 220 mg by mouth daily.   Yes [provider]    I have reviewed the patient's current medications.  Labs: Renal Panel: Recent Labs    11/30/20 1015 12/01/20 0837 12/02/20 1328 12/03/20 1610 12/04/20 1317 12/05/20 0832 12/07/20 0254 12/19/20 0006 02/28/21 2350 03/02/21 0242 03/03/21 0818 03/04/21 0730 03/05/21 0609 03/06/21 0353 03/07/21 0809 03/08/21 0115 03/09/21 0433 04/12/21 0551 10/19/21 1232 10/19/21 1840 10/20/21 0449 10/20/21 1259 10/21/21 0234 10/21/21 1501 10/22/21 0751  NA 131* 139 133* 133* 133* 132*   < > 134* 129*   < > 134* 132* 133* 134* 130* '136 140 140 140 139 140 140 139 136 139 '$  K 4.2 4.2 3.8 4.4 4.1 4.6   < > 3.9 4.1   < > 4.0 3.9 3.5 3.0* 3.1* 3.9 4.5 3.1* 5.8* 5.4* 4.9 5.0 4.6 4.7 4.2  CL 99 107 105 105 104 105   < > 99 101   < > 107 105 100 98 91* 98 104 111 115* 116* 118* 117* 116* 113* 115*  CO2 '27 25 23 '$ 21* 23 22   < > 25 16*   < > 15* 14* 20* '22 28 27 28 '$ 19* 16* 17* 16* 16* 16* 15* 15*  GLUCOSE 145* 143* 158* 197* 209* 176*   < > 143* 210*   < > 176* 145* 140* 142* 154* 158* 128* 121* 122* 116* 96 151* 124* 163* 121*  BUN 23* '20 20 19 18 17   '$ < > 32* 87*   < > 95* 94* 88* 82* 74* 65* 53* 18 40* 42* 42* 43* 45* 44* 45*  CREATININE 1.45* 1.31* 1.50* 1.25* 1.31* 1.42*   < > 1.25* 2.33*   < > 2.91* 2.85* 2.38* 1.93* 1.69* 1.46* 1.21 0.74 2.03* 1.97* 2.06* 2.20* 2.19* 2.42* 2.87*  CALCIUM  8.3* 8.6* 8.0* 8.2* 8.0* 7.8*   < > 8.4* 8.3*   < > 8.2* 8.0* 7.7* 7.7* 7.4* 7.5* 7.8* 8.2* 8.9 8.4* 8.4* 8.3* 7.9* 7.8* 7.8*  MG 2.0 2.0 1.8 2.3 2.1 1.9   < > 1.9  --   --  2.2 2.0 1.8 1.9 1.8 1.7 1.9  --   --   --   --  1.9 1.9  --   --   PHOS 3.7 3.3 3.3 3.5 2.7 2.5  --   --   --   --  6.3* 6.1* 4.8* 4.6  --   --   --   --   --   --   --   --   --   --   --   ALBUMIN 2.9* 2.7* 2.6* 2.8* 2.6* 2.6*  --   --  2.1*  --  2.0* 2.0* 1.9* 1.9*  --   --   --  2.2* 2.8*  --  2.0*  --   --   --   --    < > = values in this interval not displayed.     CBC:    Latest Ref Rng & Units 10/22/2021    7:51 AM 10/21/2021    3:01 PM 10/21/2021    4:56 AM  CBC  WBC 4.0 - 10.5 K/uL 3.9     Hemoglobin 13.0 - 17.0 g/dL 7.6  7.4  6.6   Hematocrit 39.0 - 52.0 % 24.0  23.7  21.5   Platelets 150 - 400 K/uL 201        Anemia Panel:  Recent Labs    03/02/21 1151 03/03/21 0818 10/19/21 1232 10/20/21 0449 10/21/21 0234 10/21/21 0456 10/21/21 1501 10/22/21 0751  HGB 7.0*   < > 8.7*  --  6.4* 6.6* 7.4* 7.6*  MCV  --    < > 85.0  --  82.2  --   --  81.1  VITAMINB12 471  --   --  439  --   --   --   --   FOLATE 7.2  --   --  6.9  --   --   --   --   FERRITIN 365*  --   --  164  --   --   --   --   TIBC 167*  --   --  227*  --   --   --   --   IRON 14*  --   --  12*  --   --   --   --   RETICCTPCT 1.1  --   --  2.6  --   --   --   --    < > = values in this interval not displayed.    Recent Labs  Lab 10/19/21 1232 10/20/21 0449  AST 16 10*  ALT 20 15  ALKPHOS 126 91  BILITOT 0.8 0.5  PROT 7.5 6.0*  ALBUMIN 2.8* 2.0*    Lab Results  Component Value Date   HGBA1C 5.8 (H) 10/21/2021    ROS:  Pertinent items noted in HPI and remainder of comprehensive ROS otherwise negative.  Physical Exam: Vitals:   10/22/21 0616 10/22/21 0934  BP: 138/86 (!) 163/85  Pulse: 80 82  Resp: 18 16  Temp: 98.8 F (37.1 C) 98.6 F (37 C)  SpO2: 100% 96%     General exam: Sitting on bed, coughing, not in  distress Respiratory system:  Basal crackles, respiratory effort normal, no wheezing Cardiovascular system: S1 & S2 heard, RRR.  Pedal edema++ Gastrointestinal system: Abdomen is nondistended, soft and nontender. Normal bowel sounds heard. Central nervous system: Alert and oriented. No focal neurological deficits. Extremities: Left BKA, stump site was erythematous, right lower extremity pitting edema++ Skin: No rashes, lesions or ulcers Psychiatry: Judgement and insight appear normal. Mood & affect appropriate.   Assessment/Plan:  #Acute kidney injury on CKD stage III: Multifactorial etiology including IV contrast on 8/25,CHF exacerbation/PNA and severe anemia.  UA with chronic proteinuria without RBC. I will check bladder scan, kidney ultrasound to rule out obstruction. He is clearly fluid overload and not responding well with current dose of Lasix.  I will increase Lasix to 100 mg IV twice a day, add metolazone 5 mg and IV albumin to augment renal perfusion. I had a frank discussion with the patient that if no improvement in renal function with these measures then he may need dialysis.  He is willing to do dialysis if needed. Continue with strict ins and out, daily lab.  #Acute on chronic diastolic CHF: Fluid overload therefore increasing diuretics as above.  Echo with grade 2 diastolic dysfunction.  Cardiology is following.  #Metabolic acidosis: Increase oral sodium bicarbonate.  Monitor serum CO2 level.  #Acute hypoxic respiratory failure due to HCAP and CHF exacerbation: On antibiotics for pneumonia per primary team.  #Iron deficiency anemia: Recommend IV iron and evaluation for GI bleed.  Received blood transfusion.    # HTN: Currently on amlodipine.  Diuretics as above to manage volume.  Thank you for the consult, we will continue to follow.  Carlos Quackenbush Tanna Furry 10/22/2021, 3:22 PM  [facility].

## 2021-10-22 NOTE — TOC Progression Note (Addendum)
Transition of Care [facility]) - Initial/Assessment Note    Patient Details  Name: [name] MRN: [id number] Date of Birth: May 03, 1960  Transition of Care [facility]) CM/SW Contact:    Milinda Antis, [place] Phone Number: 10/22/2021, 10:32 AM  Clinical Narrative:                 CSW contacted [facility], [place] were the patient is reportedly a resident, to inquire about the patient's return when medically ready.  There was no answer.  CSW is awaiting a returned call.    11:00-  CSW received a returned call from admissions with [facility].  The facility can accept the patient back when medically ready.   TOC will continue to follow.          Patient Goals and CMS Choice        Expected Discharge Plan and Services                                                Prior Living Arrangements/Services                       Activities of Daily Living Home Assistive Devices/Equipment: None ADL Screening (condition at time of admission) Patient's cognitive ability adequate to safely complete daily activities?: Yes Is the patient deaf or have difficulty hearing?: No Does the patient have difficulty seeing, even when wearing glasses/contacts?: No Does the patient have difficulty concentrating, remembering, or making decisions?: Yes Patient able to express need for assistance with ADLs?: Yes Does the patient have difficulty dressing or bathing?: Yes Independently performs ADLs?: No Communication: Independent Dressing (OT): Needs assistance Is this a change from baseline?: Pre-admission baseline Grooming: Needs assistance Is this a change from baseline?: Pre-admission baseline Feeding: Needs assistance Is this a change from baseline?: Pre-admission baseline Bathing: Needs assistance Is this a change from baseline?: Pre-admission baseline Toileting: Needs assistance Is this a change from baseline?: Pre-admission baseline In/Out Bed: Needs assistance Is this a  change from baseline?: Pre-admission baseline Walks in Home: Needs assistance Is this a change from baseline?: Pre-admission baseline Does the patient have difficulty walking or climbing stairs?: Yes Weakness of Legs: Both Weakness of Arms/Hands: None  Permission Sought/Granted                  Emotional Assessment              Admission diagnosis:  Community acquired pneumonia [J18.9] Hypoxia [R09.02] Hemoptysis [R04.2] Community acquired pneumonia, unspecified laterality [J18.9] Patient Active Problem List   Diagnosis Date Noted   Sepsis ([place]) 10/20/2021   Community acquired pneumonia    Acute respiratory failure with hypoxia ([place]) 10/19/2021   Multiple thyroid nodules 10/19/2021   Hyperkalemia 10/19/2021   (HFpEF) heart failure with preserved ejection fraction ([place]) 10/19/2021   Diabetic foot infection ([place]) 03/01/2021   PAD (peripheral artery disease) ([place]) 03/01/2021   Glaucoma (increased eye pressure) 03/01/2021   Pressure injury of skin 11/19/2020   Ileus ([place]) 11/18/2020   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   Depression 11/18/2020   Acute renal failure superimposed on stage 3a chronic kidney disease ([place]) 11/18/2020   BPH (benign prostatic hyperplasia) 11/18/2020   AKI (acute kidney injury) ([place]) 08/22/2020   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 08/22/2020   Pericardial effusion 08/22/2020   Acute on chronic diastolic  CHF (congestive heart failure) ([place]) 08/22/2020   TIA (transient ischemic attack) 07/03/2020   Acute on chronic heart failure with preserved ejection fraction ([place])    Pulmonary artery hypertension ([place]) 06/25/2020   History of cerebrovascular accident (CVA) with residual deficit 06/25/2020   CKD stage IIIa ([place]) 03/30/2020   Morbid (severe) obesity due to excess calories ([place]) 03/30/2020   Malnutrition of moderate degree 01/25/2020   Palliative care by specialist    Goals of care, counseling/discussion    Nephrotic syndrome 01/06/2020    Anasarca 01/06/2020   Hypokalemia due to loss of potassium 01/04/2020   Generalized body aches 01/01/2020   Non-traumatic rhabdomyolysis 01/01/2020   Chronic diastolic CHF (congestive heart failure) ([place]) 01/01/2020   Iron deficiency anemia 01/01/2020   Hypokalemia 12/31/2019   Elevated CK    Uncontrolled type 2 diabetes mellitus with hyperglycemia ([place])    Pure hypercholesterolemia    Hyperglycemia 09/28/2017   Nonobstructive atherosclerosis of coronary artery 09/28/2017   MDD (major depressive disorder), recurrent episode, severe ([place]) 07/30/2017   Osteoarthritis 04/12/2015   Severe recurrent major depression with psychotic features ([place]) 01/04/2015   Substance-related disorder ([place]) 10/30/2014   Patient's noncompliance with other medical treatment and regimen 06/07/2014   Cocaine dependence, uncomplicated ([place]) 36/14/4315   Chronic pain 05/31/2014   Cocaine abuse ([place]) 05/31/2014   History of noncompliance with medical treatment 05/31/2014   Malingering 05/31/2014   Leukopenia 05/31/2014   Diabetic neuropathy associated with diabetes mellitus due to underlying condition ([place]) 05/31/2014   Diabetic neuropathy ([place]) 05/31/2014   Depression, major, recurrent, severe with psychosis ([place]) 08/29/2013   Suicidal ideations 08/28/2013   Type 2 diabetes mellitus ([place]) 06/30/2013   Atypical chest pain 12/09/2011   Hypertension 12/09/2011   Episodic mood disorder ([place]) 05/13/2011   PCP:  Jodi Marble, MD Pharmacy:   [place], [place] - [address] [place] [address] Phone: [phone] Fax: [phone]     Social Determinants of Health (SDOH) Interventions    Readmission Risk Interventions    01/12/2020    3:39 PM  Readmission Risk Prevention Plan  Transportation Screening Complete  Palliative Care Screening Not Applicable

## 2021-10-22 NOTE — Progress Notes (Signed)
Refused AM labs at this time. Stated he wanted someone to come back at a later time

## 2021-10-22 NOTE — Progress Notes (Addendum)
Daily Progress Note Intern Pager: [phone]  Patient name: [name] Medical record number: [id number] Date of birth: [birth year] Age: 61 y.o. Gender: male  Primary Care Provider: Jodi Marble, MD Consultants: Cardiology, Nephrology  Code Status: FULL  Pt Overview and Major Events to Date:  8/25 - admitted   Assessment and Plan: [name] is a 61 y.o. male with a PMH of CHF, DM2, HTN, TIA, hx DVT and left BKA, bipolar disorder, and MDD admitted for bilateral pneumonia superimposed on acute exacerbation of HFpEF. Patient is significantly fluid overloaded on exam so we are diuresing while treating pneumonia, cards consulted. On Sunday, anemia 6.4, transfused 1 unit pRBC, now 7.6. Nephro consulted due to worsening AKI and oliguria.   * Sepsis ([place]) 2/2 pneumonia, mets SIRS criteria. Has murmur 1/6 systolic on cardiac exam. Blood cultures positive for staph species, two samples drawn from same location, likely contaminated with skin flora, thus discontinue Linezolid. Echo showed no vegetations. Patient not meeting [facility] criteria for infective endocarditis at this time. Completed Azithro 3 day course.  - Continue CTX - F/u repeat cultures, awaiting susceptibilities from initial cultures - F/u sputum culture, quantiferon, MRSA swab  (HFpEF) heart failure with preserved ejection fraction (HCC) Echo 10/21/21 revealed EF of 60-65% and Grade 2 diastolic dysfunction with moderate pulmonary hypertension and small pericardial effusion. EKG showed NSR without ST changes. BNP 547 elevated from baseline. Output 0.3 mL/kg/hr so oliguric.  - Discontinue Jardiance in setting of oliguria. - Consult Cardiology, appreciate recs - On Lasix 40 mg PO at home, received 80 mg Lasix late in the day yesterday, consulted nephrology for poor diuresis in the setting of AKI, appreciate recs  - Strict I/Os and daily weights    Acute respiratory failure with hypoxia ([place]) Likely 2/2 pneumonia superimposed  on HFpEF exacerbation. Imaging findings, fever, and sputum production raise concern for PNA. Patient also frankly volume overloaded with pitting edema to nipple line. Not on O2 at home, on 3L [facility], afebrile overnight.  - Continue ceftriaxone  - f/u MRSA swab - f/u TB quantiferon gold, patient placed on airborne and contact precautions for TB rule out  - Will need negative AFB sputum smears to definitively r/o TB - f/u blood culture x 2, sputum culture  - Flutter valve and incentive spirometry - Wean O2 support as tolerated and monitor closely  - PT/OT consulted for mobility evaluation and incentive, appreciate recs   AKI (acute kidney injury) ([place]) Acute on chronic kidney disease 3a. Baseline last recorded Cr 0.74 and GFR >60 in Feb 2023. Creatinine 2.42 and GFR 30 today (2.19 and 34 baseline). Most likely prerenal from HF and lack of perfusion to kidneys. Output 0.3 mL/kg/hr raising concern for oliguric AKI - Diuresis as above - Continue to trend daily BMP  - Avoid nephrotoxic agents - Strict I/Os and daily weights  - Nephrology consult placed due to worsening AKI and oliguria.  - After nephro consult, consider palliative consult due to multiple acute on chronic illnesses, pt has met with palliative in the past  Iron deficiency anemia Hx of microcytic anemia. Hb 8.7 and Hct 30 at baseline, 6.4 on morning of 10/21/21. 1 unit PRBC given and post transfusion h/h 7.4 and 23.7. 8/28 morning h/h 7.6 and 24.0.  - Begin every other day iron 325 mg PO with breakfast, restart home vitamin C   - Trend daily H&H - Threshold for transfusion <7 - If requires another transfusion, consider FOBT to workup  GI source of occult bleeding   Hyperkalemia Resolved with K of 4.9 on 10/20/21, potassium was 5.8 on admission. Takes potassium supplement at home for history of hypokalemia. K 4.8 today. - Continue to monitor with daily BMP  Pressure injury of skin Sacral ulcer present per SNF documentation. Unable to  visualize due to inability to turn patient without multiple staff assistance. - Wound care nurse consulted, appreciate care and recs   Type 2 diabetes mellitus ([place]) Chronic problem. On sliding scale lispro at home. Glucose range 120-180s. Hb A1c 5.8. - CBG checks AC HS - Consider starting sliding scale insulin if sugars >180  - Encourage nutrition with diabetic diet   Multiple thyroid nodules Chest CT angio revealed "multiple thyroid nodules measuring up to 2.1 cm in maximum diameter each, with progression.  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  - On home atorvastatin 80 mg  - [place] at SNF, PT/OT consult placed, appreciate recs   BPH (benign prostatic hyperplasia) Chronic, home Flomax ordered  Diabetic neuropathy (HCC) Chronic.  -On home Lyrica 75 mg BID   MDD (major depressive disorder), recurrent episode, severe (HCC) Mood and affect currently appropriate to situation  -On home Zoloft 60 mg   Hypertension Chronic  -On home amlodipine 10 mg PO daily    FEN/GI: Regular PPx: SCDs   Dispo:SNF pending clinical improvement . Barriers include fluid overload, pneumonia IV treatment, worsening AKI, anemia requiring transfusion.   Subjective:  Patient was seen at bedside resting comfortably. Still appears acutely ill and increased work of breathing, struggling to speak in full sentences due to SOB. On 2L [place] which was on side of face and readjusted to fit face better. Denies chest or abdominal pain currently. Thirsty for ice water.   Objective: Temp:  [98 F (36.7 C)-98.8 F (37.1 C)] 98.6 F (37 C) (08/28 0934) Pulse Rate:  [78-82] 82 (08/28 0934) Resp:  [16-18] 16 (08/28 0934) BP: (138-163)/(80-86) 163/85 (08/28 0934) SpO2:  [96 %-100 %] 96 % (08/28 0934) Weight:  [125.8 kg] 125.8 kg (08/28 0500) Physical Exam: General: Alert and oriented x 4, right  sided hemiplegia, ill-appearing Cardiovascular: RRR S1 S2 present, murmur 8-5/6 systolic, significantly fluid overloaded with 2+ pitting edema in right arm and throughout trunk  Respiratory: Rhonchi and rales bilaterally, on 2L [place] which keeps falling to side and needs continual replacement. Increased WOB. Oropharynx filled with sputum and blood-tinged sputum on napkin.   Extremities: Left BKA, R arm with 2+ pitting edema, no warmth, erythema, or streaking noted   Laboratory: Most recent CBC Lab Results  Component Value Date   WBC 3.9 (L) 10/22/2021   HGB 7.6 (L) 10/22/2021   HCT 24.0 (L) 10/22/2021   MCV 81.1 10/22/2021   PLT 201 10/22/2021   Most recent BMP    Latest Ref Rng & Units 10/22/2021    7:51 AM  BMP  Glucose 70 - 99 mg/dL 121   BUN 6 - 20 mg/dL 45   Creatinine 0.61 - 1.24 mg/dL 2.87   Sodium 135 - 145 mmol/L 139   Potassium 3.5 - 5.1 mmol/L 4.2   Chloride 98 - 111 mmol/L 115   CO2 22 - 32 mmol/L 15   Calcium 8.9 - 10.3 mg/dL 7.8      Imaging/Diagnostic Tests: EKG Sinus rhythm without ST changes QT c 466    Chest XRay IMPRESSION: Patchy BILATERAL  pulmonary infiltrates which could reflect pneumonia, pulmonary edema or alveolar hemorrhage. My impression: Bilateral fluffy pulmonary infiltrates likely reflecting bilateral pneumonia    CT Angio Chest w/ and w/o contrast IMPRESSION: 1. Motion artifacts at both lung bases with no definite pulmonary emboli seen. 2. Cardiomegaly and changes of acute congestive heart failure. 3. Extensive dense airspace opacity in both lungs, predominantly in a dependent distribution with a small right pleural effusion and minimal left pleural effusion. Differential considerations include dense alveolar edema and pleural effusions associated with acute CHF and extensive bilateral pneumonia with associated pleural effusions. 4. Multiple thyroid nodules measuring up to 2.1 cm in maximum diameter each, with progression. Recommend thyroid  US (ref: J Am Coll Radiol. 2015 Feb;12(2): 143-50). 5.  Calcific coronary artery and aortic atherosclerosis.  Lupita Raider, Medical Student 10/22/2021, 1:27 PM [place] Intern pager: [phone], text pages welcome Secure chat group [place]    I have evaluated this patient along with Student Dr. Orlena Sheldon and reviewed the above note, making necessary revisions.  Pearla Dubonnet, MD 10/22/2021, 3:33 PM PGY-2, [place]

## 2021-10-23 ENCOUNTER — Inpatient Hospital Stay (HOSPITAL_COMMUNITY): Payer: Medicare HMO

## 2021-10-23 DIAGNOSIS — E872 Acidosis, unspecified: Secondary | ICD-10-CM

## 2021-10-23 DIAGNOSIS — I5030 Unspecified diastolic (congestive) heart failure: Secondary | ICD-10-CM | POA: Diagnosis not present

## 2021-10-23 DIAGNOSIS — N179 Acute kidney failure, unspecified: Secondary | ICD-10-CM | POA: Diagnosis not present

## 2021-10-23 HISTORY — DX: Acidosis, unspecified: E87.20

## 2021-10-23 LAB — RENAL FUNCTION PANEL
Albumin: 2.3 g/dL — ABNORMAL LOW (ref 3.5–5.0)
Anion gap: 9 (ref 5–15)
BUN: 48 mg/dL — ABNORMAL HIGH (ref 6–20)
CO2: 16 mmol/L — ABNORMAL LOW (ref 22–32)
Calcium: 7.9 mg/dL — ABNORMAL LOW (ref 8.9–10.3)
Chloride: 113 mmol/L — ABNORMAL HIGH (ref 98–111)
Creatinine, Ser: 3.37 mg/dL — ABNORMAL HIGH (ref 0.61–1.24)
GFR, Estimated: 20 mL/min — ABNORMAL LOW (ref >60)
Glucose, Bld: 144 mg/dL — ABNORMAL HIGH (ref 70–99)
Phosphorus: 5.9 mg/dL — ABNORMAL HIGH (ref 2.5–4.6)
Potassium: 3.8 mmol/L (ref 3.5–5.1)
Sodium: 138 mmol/L (ref 135–145)

## 2021-10-23 LAB — CBC
HCT: 25.8 % — ABNORMAL LOW (ref 39.0–52.0)
Hemoglobin: 8.2 g/dL — ABNORMAL LOW (ref 13.0–17.0)
MCH: 25.7 pg — ABNORMAL LOW (ref 26.0–34.0)
MCHC: 31.8 g/dL (ref 30.0–36.0)
MCV: 80.9 fL (ref 80.0–100.0)
Platelets: 206 10*3/uL (ref 150–400)
RBC: 3.19 MIL/uL — ABNORMAL LOW (ref 4.22–5.81)
RDW: 16.7 % — ABNORMAL HIGH (ref 11.5–15.5)
WBC: 3.8 10*3/uL — ABNORMAL LOW (ref 4.0–10.5)
nRBC: 0 % (ref 0.0–0.2)

## 2021-10-23 LAB — GLUCOSE, CAPILLARY
Glucose-Capillary: 137 mg/dL — ABNORMAL HIGH (ref 70–99)
Glucose-Capillary: 143 mg/dL — ABNORMAL HIGH (ref 70–99)
Glucose-Capillary: 160 mg/dL — ABNORMAL HIGH (ref 70–99)

## 2021-10-23 MED ORDER — DARBEPOETIN ALFA 60 MCG/0.3ML IJ SOSY
60.0000 ug | PREFILLED_SYRINGE | Freq: Once | INTRAMUSCULAR | Status: AC
  Administered 2021-10-23: 60 ug via SUBCUTANEOUS
  Filled 2021-10-23 (×2): qty 0.3

## 2021-10-23 MED ORDER — SODIUM CHLORIDE 0.9 % IV SOLN: 250.0000 mg | Freq: Every day | INTRAVENOUS | Status: DC

## 2021-10-23 MED ORDER — PREGABALIN 75 MG PO CAPS
75.0000 mg | ORAL_CAPSULE | Freq: Every day | ORAL | Status: DC
  Administered 2021-10-23 – 2021-11-01 (×10): 75 mg via ORAL
  Filled 2021-10-23 (×10): qty 1

## 2021-10-23 NOTE — Progress Notes (Addendum)
[name]  Assessment/ Plan: Pt is a 61 y.o. yo male  with past medical history of hypertension, diabetes, TIA, DVT, peripheral vascular disease with left BKA, bipolar mood disorder, CHF who was admitted on 8/25 for shortness of breath and fluid overload seen as a consultation for the management of acute kidney injury.   #Acute kidney injury on CKD stage III: Multifactorial etiology including IV contrast on 8/25,CHF exacerbation/PNA and severe anemia and +BOO.  UA with chronic proteinuria without RBC. Bladder scan with acute urinary retention requiring intermittent catheterization.  Kidney ultrasound unremarkable. He is clearly fluid overload therefore I increased Lasix to 100 mg IV twice a day, added metolazone 5 mg and IV albumin to augment renal perfusion.  The urine output is slightly gone up.  I am waiting lab results from today. I had a frank discussion with the patient that if no improvement in renal function with these measures then he may need dialysis.  He is willing to do dialysis if needed. Continue with strict ins and out, daily lab.   Requiring intermittent cath therefore may benefit from indwelling Foley catheter for acute urinary retention and Foley strict ins and out.   #Acute on chronic diastolic CHF: Fluid overload therefore increasing diuretics as above.  Echo with grade 2 diastolic dysfunction.  Cardiology is following.   #Metabolic acidosis: Increase oral sodium bicarbonate.  Monitor serum CO2 level.   #Acute hypoxic respiratory failure due to HCAP and CHF exacerbation: On antibiotics for pneumonia per primary team.   #Iron deficiency anemia: Iron saturation is only 5%, hold IV iron now because of him being on antibiotics.  Recommend evaluation for GI bleed.  Received blood transfusion.  Order Aranesp.   # HTN: Currently on amlodipine.  Diuretics as above to manage volume.  Follow lab results from today.   Subjective: Seen and  examined at bedside.  As per nurse there is urinary retention requiring intermittent catheterization.  He is clinically not much changed from yesterday.  Still having cough but denies shortness of breath or chest pain.  Urine output is recorded only 1.2 L.  Unfortunately the lab results not available yet as patient refused to draw the first time. Objective Vital signs in last 24 hours: Vitals:   10/22/21 1644 10/22/21 2314 10/23/21 0500 10/23/21 0939  BP: (!) 157/88 (!) 155/101 (!) 163/99 (!) 166/93  Pulse: 73 73 80 77  Resp: '17 18 18   '$ Temp: 99 F (37.2 C) 97.8 F (36.6 C) 98 F (36.7 C)   TempSrc:  Oral Tympanic   SpO2: 94% 98% 98% 100%  Weight:       Weight change:   Intake/Output Summary (Last 24 hours) at 10/23/2021 1111 Last data filed at 10/23/2021 0814 Gross per 24 hour  Intake 1340 ml  Output 750 ml  Net 590 ml       Labs: RENAL PANEL Recent Labs    11/30/20 1015 12/01/20 0837 12/02/20 1328 12/03/20 1610 12/04/20 1317 12/05/20 0832 12/07/20 0254 12/19/20 0006 02/28/21 2350 03/02/21 0242 03/03/21 0818 03/04/21 0730 03/05/21 0354 03/06/21 0353 03/07/21 0809 03/08/21 0115 03/09/21 0433 04/12/21 0551 10/19/21 1232 10/19/21 1840 10/20/21 0449 10/20/21 1259 10/21/21 0234 10/21/21 1501 10/22/21 0751  NA 131* 139 133* 133* 133* 132*   < > 134* 129*   < > 134* 132* 133* 134* 130* '136 140 140 140 139 140 140 139 136 139 '$  K 4.2 4.2 3.8 4.4 4.1 4.6   < > 3.9  4.1   < > 4.0 3.9 3.5 3.0* 3.1* 3.9 4.5 3.1* 5.8* 5.4* 4.9 5.0 4.6 4.7 4.2  CL 99 107 105 105 104 105   < > 99 101   < > 107 105 100 98 91* 98 104 111 115* 116* 118* 117* 116* 113* 115*  CO2 '27 25 23 '$ 21* 23 22   < > 25 16*   < > 15* 14* 20* '22 28 27 28 '$ 19* 16* 17* 16* 16* 16* 15* 15*  GLUCOSE 145* 143* 158* 197* 209* 176*   < > 143* 210*   < > 176* 145* 140* 142* 154* 158* 128* 121* 122* 116* 96 151* 124* 163* 121*  BUN 23* '20 20 19 18 17   '$ < > 32* 87*   < > 95* 94* 88* 82* 74* 65* 53* 18 40* 42* 42* 43*  45* 44* 45*  CREATININE 1.45* 1.31* 1.50* 1.25* 1.31* 1.42*   < > 1.25* 2.33*   < > 2.91* 2.85* 2.38* 1.93* 1.69* 1.46* 1.21 0.74 2.03* 1.97* 2.06* 2.20* 2.19* 2.42* 2.87*  CALCIUM 8.3* 8.6* 8.0* 8.2* 8.0* 7.8*   < > 8.4* 8.3*   < > 8.2* 8.0* 7.7* 7.7* 7.4* 7.5* 7.8* 8.2* 8.9 8.4* 8.4* 8.3* 7.9* 7.8* 7.8*  MG 2.0 2.0 1.8 2.3 2.1 1.9   < > 1.9  --   --  2.2 2.0 1.8 1.9 1.8 1.7 1.9  --   --   --   --  1.9 1.9  --   --   PHOS 3.7 3.3 3.3 3.5 2.7 2.5  --   --   --   --  6.3* 6.1* 4.8* 4.6  --   --   --   --   --   --   --   --   --   --   --   ALBUMIN 2.9* 2.7* 2.6* 2.8* 2.6* 2.6*  --   --  2.1*  --  2.0* 2.0* 1.9* 1.9*  --   --   --  2.2* 2.8*  --  2.0*  --   --   --   --    < > = values in this interval not displayed.     Liver Function Tests: Recent Labs  Lab 10/19/21 1232 10/20/21 0449  AST 16 10*  ALT 20 15  ALKPHOS 126 91  BILITOT 0.8 0.5  PROT 7.5 6.0*  ALBUMIN 2.8* 2.0*   Recent Labs  Lab 10/19/21 1232  LIPASE 20   No results for input(s): "AMMONIA" in the last 168 hours. CBC: Recent Labs    03/02/21 1151 03/03/21 0818 10/20/21 0449 10/21/21 0234 10/21/21 0456 10/21/21 1501 10/22/21 0751 10/23/21 0947  HGB 7.0*   < >  --  6.4* 6.6* 7.4* 7.6* 8.2*  MCV  --    < >  --  82.2  --   --  81.1 80.9  VITAMINB12 471  --  439  --   --   --   --   --   FOLATE 7.2  --  6.9  --   --   --   --   --   FERRITIN 365*  --  164  --   --   --   --   --   TIBC 167*  --  227*  --   --   --   --   --   IRON 14*  --  12*  --   --   --   --   --  RETICCTPCT 1.1  --  2.6  --   --   --   --   --    < > = values in this interval not displayed.    Cardiac Enzymes: No results for input(s): "CKTOTAL", "CKMB", "CKMBINDEX", "TROPONINI" in the last 168 hours. CBG: Recent Labs  Lab 10/22/21 0740 10/22/21 1124 10/22/21 1646 10/22/21 2315 10/23/21 0723  GLUCAP 122* 116* 170* 181* 137*    Iron Studies: No results for input(s): "IRON", "TIBC", "TRANSFERRIN", "FERRITIN" in the last 72  hours. Studies/Results: US RENAL  Result Date: 10/23/2021 CLINICAL DATA:  Acute kidney injury EXAM: RENAL / URINARY TRACT ULTRASOUND COMPLETE COMPARISON:  01/09/2020 FINDINGS: Right Kidney: Renal measurements: 12.0 x 5.6 x 6.4 cm = volume: 187 mL. Echogenicity within normal limits. No mass or hydronephrosis visualized. Limited visualization of the upper pole. Left Kidney: Renal measurements: 12.7 x 6.2 x 5.3 cm = volume: 219 mL. Echogenicity within normal limits. No mass or hydronephrosis visualized. Bladder: Appears normal for degree of bladder distention. Other: None. IMPRESSION: No acute sonographic abnormality of the kidneys. Electronically Signed   By: Miachel Roux M.D.   On: 10/23/2021 09:03   ECHOCARDIOGRAM COMPLETE  Result Date: 10/21/2021    ECHOCARDIOGRAM REPORT   Patient Name:   [name] Date of Exam: 10/21/2021 Medical Rec #:  [id number]     Height:       68.0 in Accession #:    [id number]    Weight:       265.2 lb Date of Birth:  1960-03-15      BSA:          2.304 m Patient Age:    89 years      BP:           136/88 mmHg Patient Gender: M             HR:           80 bpm. Exam Location:  Inpatient Procedure: 2D Echo Indications:    congestive heart failure  History:        Patient has prior history of Echocardiogram examinations, most                 recent 08/22/2020. PAD; Risk Factors:Hypertension and Diabetes.  Sonographer:    Johny Chess RDCS Referring Phys: [id number] [place]  1. Left ventricular ejection fraction, by estimation, is 60 to 65%. The left ventricle has normal function. The left ventricle has no regional wall motion abnormalities. Left ventricular diastolic parameters are consistent with Grade II diastolic dysfunction (pseudonormalization). Elevated left ventricular end-diastolic pressure. The E/e' is 24.  2. Right ventricular systolic function is normal. The right ventricular size is normal. There is moderately elevated pulmonary artery systolic  pressure. The estimated right ventricular systolic pressure is 00.7 mmHg.  3. Left atrial size was moderately dilated.  4. A small pericardial effusion is present. The pericardial effusion is circumferential. There is no evidence of cardiac tamponade.  5. The mitral valve is abnormal. Mild mitral valve regurgitation.  6. The aortic valve is tricuspid. Aortic valve regurgitation is trivial. Moderate aortic valve stenosis. Aortic valve area, by VTI measures 1.27 cm. Aortic valve mean gradient measures 17.0 mmHg. Aortic valve Vmax measures 2.81 m/s.  7. The inferior vena cava is dilated in size with <50% respiratory variability, suggesting right atrial pressure of 15 mmHg. Comparison(s): Changes from prior study are noted. 08/22/2020: LVEF 65-70%, grade 2 DD, moderate LAE, small pericardial effusion -  the effusion appears unchanged. FINDINGS  Left Ventricle: Left ventricular ejection fraction, by estimation, is 60 to 65%. The left ventricle has normal function. The left ventricle has no regional wall motion abnormalities. The left ventricular internal cavity size was normal in size. There is  no left ventricular hypertrophy. Left ventricular diastolic parameters are consistent with Grade II diastolic dysfunction (pseudonormalization). Elevated left ventricular end-diastolic pressure. The E/e' is 54. Right Ventricle: The right ventricular size is normal. No increase in right ventricular wall thickness. Right ventricular systolic function is normal. There is moderately elevated pulmonary artery systolic pressure. The tricuspid regurgitant velocity is 2.82 m/s, and with an assumed right atrial pressure of 15 mmHg, the estimated right ventricular systolic pressure is 07.8 mmHg. Left Atrium: Left atrial size was moderately dilated. Right Atrium: Right atrial size was normal in size. Pericardium: A small pericardial effusion is present. The pericardial effusion is circumferential. There is no evidence of cardiac tamponade.  Mitral Valve: The mitral valve is abnormal. There is mild calcification of the posterior mitral valve leaflet(s). Mild to moderate mitral annular calcification. Mild mitral valve regurgitation. Tricuspid Valve: The tricuspid valve is grossly normal. Tricuspid valve regurgitation is trivial. Aortic Valve: The aortic valve is tricuspid. Aortic valve regurgitation is trivial. Moderate aortic stenosis is present. Aortic valve mean gradient measures 17.0 mmHg. Aortic valve peak gradient measures 31.6 mmHg. Aortic valve area, by VTI measures 1.27  cm. Pulmonic Valve: The pulmonic valve was grossly normal. Pulmonic valve regurgitation is trivial. Aorta: The aortic root and ascending aorta are structurally normal, with no evidence of dilitation. Venous: The inferior vena cava is dilated in size with less than 50% respiratory variability, suggesting right atrial pressure of 15 mmHg. IAS/Shunts: No atrial level shunt detected by color flow Doppler.  LEFT VENTRICLE PLAX 2D LVIDd:         4.90 cm   Diastology LVIDs:         3.30 cm   LV e' medial:    4.79 cm/s LV PW:         1.40 cm   LV E/e' medial:  17.7 LV IVS:        1.00 cm   LV e' lateral:   7.51 cm/s LVOT diam:     2.10 cm   LV E/e' lateral: 11.3 LV SV:         67 LV SV Index:   29 LVOT Area:     3.46 cm  RIGHT VENTRICLE RV S prime:     12.90 cm/s TAPSE (M-mode): 2.4 cm LEFT ATRIUM             Index        RIGHT ATRIUM           Index LA diam:        4.80 cm 2.08 cm/m   RA Area:     15.40 cm LA Vol (A2C):   96.1 ml 41.71 ml/m  RA Volume:   37.60 ml  16.32 ml/m LA Vol (A4C):   84.5 ml 36.67 ml/m LA Biplane Vol: 92.9 ml 40.32 ml/m  AORTIC VALVE AV Area (Vmax):    1.19 cm AV Area (Vmean):   1.21 cm AV Area (VTI):     1.27 cm AV Vmax:           281.00 cm/s AV Vmean:          186.000 cm/s AV VTI:            0.526 m AV  Peak Grad:      31.6 mmHg AV Mean Grad:      17.0 mmHg LVOT Vmax:         96.40 cm/s LVOT Vmean:        65.200 cm/s LVOT VTI:          0.193 m  LVOT/AV VTI ratio: 0.37  AORTA Ao Root diam: 3.00 cm Ao Asc diam:  3.50 cm MITRAL VALVE               TRICUSPID VALVE MV Area (PHT): 3.72 cm    TR Peak grad:   31.8 mmHg MV Decel Time: 204 msec    TR Vmax:        282.00 cm/s MV E velocity: 84.60 cm/s MV A velocity: 71.70 cm/s  SHUNTS MV E/A ratio:  1.18        Systemic VTI:  0.19 m                            Systemic Diam: 2.10 cm Lyman Bishop MD Electronically signed by Lyman Bishop MD Signature Date/Time: 10/21/2021/12:38:20 PM    Final     Medications: Infusions:  cefTRIAXone (ROCEPHIN)  IV 2 g (10/23/21 0941)   furosemide 100 mg (10/23/21 0515)    Scheduled Medications:  amLODipine  10 mg Oral Daily   ascorbic acid  500 mg Oral BID   aspirin  81 mg Oral Daily   atorvastatin  80 mg Oral QHS   brimonidine  1 drop Both Eyes BID   dorzolamide-timolol  1 drop Both Eyes BID   ferrous sulfate  325 mg Oral QODAY   latanoprost  1 drop Both Eyes QHS   metolazone  5 mg Oral Daily   pregabalin  75 mg Oral Daily   sertraline  50 mg Oral QHS   sodium bicarbonate  1,300 mg Oral BID   tamsulosin  0.8 mg Oral Daily    have reviewed scheduled and prn medications.  Physical Exam: General:NAD, comfortable Heart:RRR, s1s2 nl, no rubs Lungs: Bilateral rhonchi, some crackles, no wheezing Abdomen:soft, Non-tender, non-distended Extremities: b/l legs pitting edema ++ Neurology: Alert, awake, nonfocal and no asterixis  Phillippa Straub Prasad Dajanay Northrup 10/23/2021,11:11 AM  LOS: 4 days

## 2021-10-23 NOTE — Progress Notes (Signed)
Called and updated patient's brother [name] per patient's request.

## 2021-10-23 NOTE — Progress Notes (Signed)
Made Dr. Carolin Sicks aware that bladder scan result is > 628 cc. MD gave order to insert foley catheter for urinary retention.

## 2021-10-23 NOTE — Progress Notes (Signed)
Largest part of right forearm circumference measures 15 inches (38 cm).

## 2021-10-23 NOTE — Progress Notes (Addendum)
Daily Progress Note Intern Pager: [phone]  Patient name: [name] Medical record number: [id number] Date of birth: 09/29/1960 Age: 61 y.o. Gender: male  Primary Care Provider: Jodi Marble, MD Consultants: Cardiology, Nephrology Code Status: FULL  Pt Overview and Major Events to Date:  8/25 - admitted  Assessment and Plan: [name] is a 61 y.o. 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 Patient is significantly fluid overloaded on exam so we are diuresing while treating pneumonia, cards consulted. On Sunday, anemia 6.4, transfused 1 unit pRBC, now 7.6. Nephro consulted due to worsening AKI and oliguria, may progress to requiring HD. Palliative consulted for [place] discussion given baseline disability and multiple acute processes.   * AKI (acute kidney injury) ([place]) Acute on chronic kidney disease IIIa. Baseline last recorded Cr 0.74 and GFR >60 in Feb 2023. Most recent Creatinine 2.87 and GFR 24 today. Per renal, multifactorial etiology including IV contrast on admission, CHF exacerbation/PNA (prerenal) and severe anemia. Bladder scan yielded 626 ccs. Renal ultrasound showed no acute abnormality. -Nephrology following, appreciate care and recommendations  - Increase Lasix to 100 mg IV BID - Start metolazone 5 mg  - Start IV albumin - Continue to trend daily BMP  - Avoid nephrotoxic agents - Strict I/Os and daily weights  - Bladder scan q8h (so far 626 and 325)  - Renal function panel pending  - Consider HD if no improvement in renal function  - Placed palliative consult  Pneumonia Originally septic with fever, had murmur 1/6 systolic on cardiac exam. Echo showed no vegetations. According to [facility] criteria, patient does not require further workup for infective endocarditis. Blood cultures positive for staph  species, two samples drawn from same location, likely contaminated with skin flora. Repeat blood cultures NG3D. Completed Azithro 3 day course for PNA which was confirmed with imaging findings, fever, and sputum production. - Continue CTX day 5/7 IV  - F/u sputum culture, quantiferon, MRSA swab -Continue airborne precautions until TB ruled out  (HFpEF) heart failure with preserved ejection fraction ([place]) Echo 10/21/21 revealed EF of 60-65% and Grade 2 diastolic dysfunction with moderate pulmonary hypertension and small pericardial effusion. EKG showed NSR without ST changes. BNP 547 elevated from baseline. Output 0.3 mL/kg/hr so oliguric.  - Strict I/Os and daily weights  - Diurese per nephrology  - Cardiology consulted, appreciate recs   Acute respiratory failure with hypoxia ([place]) Likely 2/2 pneumonia superimposed on HFpEF exacerbation. Imaging findings, fever, and sputum production raised concern for PNA. Patient also frankly volume overloaded with pitting edema to nipple line. Not on O2 at home, on 2L [place]. - Continue ceftriaxone  - Flutter valve and incentive spirometry - Wean O2 support as tolerated and monitor closely  - PT/OT consulted for mobility evaluation and incentive, appreciate recs  - f/u MRSA swab - f/u TB quantiferon gold, patient placed on airborne and contact precautions for TB rule out  - f/u blood culture x 2, sputum culture    Iron deficiency anemia Hx of microcytic anemia. Hb 8.7 and Hct 30 at baseline, 6.4 on morning of 10/21/21. 1 unit PRBC given and post transfusion h/h 7.4 and 23.7. - Begin every other day iron 325 mg PO with breakfast, restart home vitamin C   - Trend daily H&H - Threshold for transfusion <7 - FOBT  pending - If requires another transfusion, workup possible source of bleeding further    Metabolic acidosis CO2 15. Most likely secondary to acute PNA on chronic CHF with exacerbation in addition to CKD stage IIIa.  - Increase sodium bicarbonate  tablet to 1,300 mg PO BID   Hyperkalemia Resolved with K of 4.9 on 10/20/21, potassium was 5.8 on admission. Takes potassium supplement at home for history of hypokalemia. K 4.8 today. - Continue to monitor with daily BMP  Pressure injury of skin Sacral ulcer present per SNF documentation. Unable to visualize due to inability to turn patient without multiple staff assistance. - Wound care nurse consulted, appreciate care and recs   Type 2 diabetes mellitus ([place]) Chronic problem. On sliding scale lispro at home. Glucose range 120-180s. Hb A1c 5.8. - CBG checks AC HS - Consider starting sliding scale insulin if sugars >180  - Encourage nutrition with diabetic diet   Multiple thyroid nodules Chest CT angio revealed "multiple thyroid nodules measuring up to 2.1 cm in maximum diameter each, with progression.  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  - On home atorvastatin 80 mg  - [place] at SNF, PT/OT consult placed, appreciate recs   BPH (benign prostatic hyperplasia) Chronic, home Flomax ordered  Diabetic neuropathy (HCC) Chronic.  -On home Lyrica 75 mg daily  MDD (major depressive disorder), recurrent episode, severe (HCC) Mood and affect currently appropriate to situation  -On home Zoloft 60 mg   Hypertension Chronic  -On home amlodipine 10 mg PO daily   Fever-resolved as of 10/20/2021 Fever of 100.8 in ED, likely pneumonia.     FEN/GI: Regular PPx: SCDs Dispo:SNF pending clinical improvement . Barriers include fluid overload, pneumonia IV treatment, worsening AKI, anemia requiring transfusion.   Subjective:  Patient was seen at bedside resting comfortably. Looks slightly better today. Rhonchi are not as audible in conversation with him. Still has increased work of breathing. Denies blood-tinged sputum. Denies chest pain or abdominal pain. Still fluid  overloaded on exam. Denies pain in his R arm which is swollen and immobile. 2L [place] placed appropriately. Discussed placing a palliative consult with him to help with pain management and optimizing his quality of life. He did not remember meeting with them in the past, but agreed to meet with them during this admission.   Objective: Temp:  [97.8 F (36.6 C)-99 F (37.2 C)] 98 F (36.7 C) (08/29 0500) Pulse Rate:  [73-80] 77 (08/29 0939) Resp:  [17-18] 18 (08/29 0500) BP: (155-166)/(88-101) 166/93 (08/29 0939) SpO2:  [94 %-100 %] 100 % (08/29 0939) Physical Exam: General: Alert and oriented x 4, right sided hemiplegia, ill-appearing Cardiovascular: RRR S1 S2 present, murmur 6-2/6 systolic, significantly fluid overloaded with 2+ pitting edema in right arm and throughout trunk and lower extremities   Respiratory: Rhonchi bilaterally only on exhalation. Rales have improved. On 2L [place], slightly increased WOB. Less sputum in oropharynx.  Extremities: Left BKA, R arm with 2+ pitting edema, warmth noted, but no erythema or streaking.   Laboratory: Most recent CBC Lab Results  Component Value Date   WBC 3.8 (L) 10/23/2021   HGB 8.2 (L) 10/23/2021   HCT 25.8 (L) 10/23/2021   MCV 80.9 10/23/2021   PLT 206 10/23/2021   Most recent BMP    Latest Ref Rng & Units 10/23/2021    9:47 AM  BMP  Glucose 70 - 99  mg/dL 144   BUN 6 - 20 mg/dL 48   Creatinine 0.61 - 1.24 mg/dL 3.37   Sodium 135 - 145 mmol/L 138   Potassium 3.5 - 5.1 mmol/L 3.8   Chloride 98 - 111 mmol/L 113   CO2 22 - 32 mmol/L 16   Calcium 8.9 - 10.3 mg/dL 7.9     Imaging/Diagnostic Tests: EKG Sinus rhythm without ST changes QT c 466    Chest XRay IMPRESSION: Patchy BILATERAL pulmonary infiltrates which could reflect pneumonia, pulmonary edema or alveolar hemorrhage. My impression: Bilateral fluffy pulmonary infiltrates likely reflecting bilateral pneumonia    CT Angio Chest w/ and w/o contrast IMPRESSION: 1. Motion artifacts  at both lung bases with no definite pulmonary emboli seen. 2. Cardiomegaly and changes of acute congestive heart failure. 3. Extensive dense airspace opacity in both lungs, predominantly in a dependent distribution with a small right pleural effusion and minimal left pleural effusion. Differential considerations include dense alveolar edema and pleural effusions associated with acute CHF and extensive bilateral pneumonia with associated pleural effusions. 4. Multiple thyroid nodules measuring up to 2.1 cm in maximum diameter each, with progression. Recommend thyroid US (ref: J Am Coll Radiol. 2015 Feb;12(2): 143-50). 5.  Calcific coronary artery and aortic atherosclerosis.  Renal Ultrasound IMPRESSION:  No acute sonographic abnormality of the kidneys.   Lupita Raider, Medical Student 10/23/2021, 11:53 AM [place] Intern pager: [phone], text pages welcome Secure chat group [place] Upper-Level Resident Addendum   I have independently interviewed and examined the patient. I have discussed the above with the original author and agree with their documentation. My edits for correction/addition/clarification are included where appropriate. Please see also any attending notes.   Sharion Settler, DO PGY-3, [facility] 10/23/2021 12:22 PM  [place] Service pager: [phone] (text pages welcome through [facility])

## 2021-10-23 NOTE — Assessment & Plan Note (Addendum)
CO2 stably low.  - Continue sodium bicarbonate tablet 1,300 mg PO BID

## 2021-10-23 NOTE — Progress Notes (Signed)
Patient has not been able to produce any more sputum today for acid fast specimen.

## 2021-10-24 ENCOUNTER — Inpatient Hospital Stay (HOSPITAL_BASED_OUTPATIENT_CLINIC_OR_DEPARTMENT_OTHER): Payer: Medicare HMO

## 2021-10-24 ENCOUNTER — Encounter (HOSPITAL_COMMUNITY): Payer: Self-pay | Admitting: Student

## 2021-10-24 DIAGNOSIS — I5030 Unspecified diastolic (congestive) heart failure: Secondary | ICD-10-CM | POA: Diagnosis not present

## 2021-10-24 DIAGNOSIS — M7989 Other specified soft tissue disorders: Secondary | ICD-10-CM

## 2021-10-24 DIAGNOSIS — N179 Acute kidney failure, unspecified: Secondary | ICD-10-CM | POA: Diagnosis not present

## 2021-10-24 LAB — GLUCOSE, CAPILLARY
Glucose-Capillary: 182 mg/dL — ABNORMAL HIGH (ref 70–99)
Glucose-Capillary: 143 mg/dL — ABNORMAL HIGH (ref 70–99)
Glucose-Capillary: 147 mg/dL — ABNORMAL HIGH (ref 70–99)
Glucose-Capillary: 164 mg/dL — ABNORMAL HIGH (ref 70–99)

## 2021-10-24 LAB — CBC
HCT: 24.6 % — ABNORMAL LOW (ref 39.0–52.0)
Hemoglobin: 7.5 g/dL — ABNORMAL LOW (ref 13.0–17.0)
MCH: 25 pg — ABNORMAL LOW (ref 26.0–34.0)
MCHC: 30.5 g/dL (ref 30.0–36.0)
MCV: 82 fL (ref 80.0–100.0)
Platelets: 204 10*3/uL (ref 150–400)
RBC: 3 MIL/uL — ABNORMAL LOW (ref 4.22–5.81)
RDW: 16.6 % — ABNORMAL HIGH (ref 11.5–15.5)
WBC: 3.4 10*3/uL — ABNORMAL LOW (ref 4.0–10.5)
nRBC: 0 % (ref 0.0–0.2)

## 2021-10-24 LAB — RENAL FUNCTION PANEL
Albumin: 2 g/dL — ABNORMAL LOW (ref 3.5–5.0)
Anion gap: 8 (ref 5–15)
BUN: 47 mg/dL — ABNORMAL HIGH (ref 6–20)
CO2: 17 mmol/L — ABNORMAL LOW (ref 22–32)
Calcium: 7.7 mg/dL — ABNORMAL LOW (ref 8.9–10.3)
Chloride: 113 mmol/L — ABNORMAL HIGH (ref 98–111)
Creatinine, Ser: 3.58 mg/dL — ABNORMAL HIGH (ref 0.61–1.24)
GFR, Estimated: 19 mL/min — ABNORMAL LOW (ref >60)
Glucose, Bld: 149 mg/dL — ABNORMAL HIGH (ref 70–99)
Phosphorus: 6.1 mg/dL — ABNORMAL HIGH (ref 2.5–4.6)
Potassium: 4.1 mmol/L (ref 3.5–5.1)
Sodium: 138 mmol/L (ref 135–145)

## 2021-10-24 LAB — ACID FAST SMEAR (AFB, MYCOBACTERIA): Acid Fast Smear: NEGATIVE

## 2021-10-24 MED ORDER — FUROSEMIDE 40 MG PO TABS
80.0000 mg | ORAL_TABLET | Freq: Two times a day (BID) | ORAL | Status: DC
Start: 2021-10-24 — End: 2021-11-01
  Administered 2021-10-24 – 2021-11-01 (×16): 80 mg via ORAL
  Filled 2021-10-24 (×15): qty 2

## 2021-10-24 NOTE — Progress Notes (Signed)
Upper extremity venous bilateral study completed.   Please see CV Proc for preliminary results.   Darlin Coco, RDMS, RVT

## 2021-10-24 NOTE — Progress Notes (Addendum)
Daily Progress Note Intern Pager: [phone]  Patient name: [name] Medical record number: [id number] Date of birth: 02-Mar-1960 Age: 61 y.o. Gender: male  Primary Care Provider: Jodi Marble, MD Consultants: Cardiology, Nephrology, Palliative  Code Status: FULL  Pt Overview and Major Events to Date:  8/25 - admitted   Assessment and Plan: [name] is a 61 y.o. 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 We are monitoring his fluid status while diuresing, treating pneumonia, and consulting palliative for [place] discussion given baseline disability and multiple acute processes.   * AKI (acute kidney injury) ([place]) Acute on chronic kidney disease IIIa. Baseline last recorded Cr 0.74 and GFR >60 in Feb 2023. Most recent Creatinine 3.37 and GFR 20. AM chemistry from today is still pending. Per renal, multifactorial etiology including IV contrast on admission, CHF exacerbation/PNA (prerenal) and severe anemia. Renal ultrasound showed no acute abnormality. Most recent output 0.8 mL/kg/hr.  - Nephrology following, appreciate care and recommendations  - Nephro transitioning IV diuretic to PO today - Continue metolazone 5 mg  - Completed course of IV albumin - Continue to trend daily BMP  - Avoid nephrotoxic agents - Strict I/Os and daily weights  - Bladder scan q8h (so far 626, 325, >629 cc). Foley placed.  - Placed palliative consult  Pneumonia Originally septic with fever. Blood cultures positive for staph species, two samples drawn from same location, likely contaminated with skin flora. Repeat blood cultures NG4D. Completed Azithro 3 day course for PNA which was confirmed with imaging findings, fever, and sputum production. - Continue CTX, day 6/10 abx  - Airborne precautions d/c'd today. See Dr. Adin Hector  separate note - Flutter valve and incentive spirometer  (HFpEF) heart failure with preserved ejection fraction ([place]) Echo 10/21/21 revealed EF of 60-65% and Grade 2 diastolic dysfunction with moderate pulmonary hypertension and small pericardial effusion. EKG showed NSR without ST changes. BNP 547 elevated from baseline. - Strict I/Os and daily weights  - Diurese per nephrology  - Cardiology consulted, appreciate recs  Iron deficiency anemia Hx of microcytic anemia. Hb 8.7 and Hct 30 at baseline, 6.4 on morning of 10/21/21. 1 unit PRBC given and post transfusion h/h 7.4 and 23.7. - Continue every other day iron 325 mg PO with breakfast, continue home vitamin C   - Per nephro, given 60 mcg injection of Aranesp  - Trend daily H&H (most recent 8.2)  - Threshold for transfusion <7 - Consider FOBT if H&H lowers again to look for source of bleeding    Swelling of arm Right sided weakness from prior CVA. Right arm swollen at baseline due to immobility and fluid overload. Currently no redness or streaking noted. Monitor for development of DVT symptoms. Subjectively much improved today.  - Monitor right arm circumference: currently 15 inches   Metabolic acidosis Most likely secondary to acute PNA on chronic CHF with exacerbation in addition to CKD stage IIIa.  - Continue sodium bicarbonate tablet 1,300 mg PO BID   FEN/GI: Regular  PPx: SCDs Dispo:SNF pending clinical improvement . Barriers include diuresing for fluid overload, worsening AKI, and 10 day course of CTX for pneumonia.   Subjective:  Patient was seen at bedside, more  currently no pain although   Objective: Temp:  [97.6 F (36.4 C)-98.3 F (36.8 C)] 98.3 F (36.8  C) (08/30 0943) Pulse Rate:  [72-80] 80 (08/30 0943) Resp:  [16-18] 17 (08/30 0943) BP: (147-157)/(89-107) 154/89 (08/30 0943) SpO2:  [95 %-100 %] 95 % (08/30 0943) Physical Exam: General: Alert and oriented x 4, right sided hemiplegia, pleasant and conversational   Cardiovascular: RRR S1 S2 present, murmur 3-8/4 systolic, fluid overloaded with 2+ pitting edema in right arm and lower extremities  Respiratory: Rhonchi bilaterally heard only on exhalation. Rales still improving. Increased WOB. Not producing sputum. Extremities: Left BKA, R arm with 2+ pitting edema with no streaking or erythema. Per nursing, largest part of right forearm circumference measures 15 inches/38 cm.   Laboratory: Most recent CBC Lab Results  Component Value Date   WBC 3.8 (L) 10/23/2021   HGB 8.2 (L) 10/23/2021   HCT 25.8 (L) 10/23/2021   MCV 80.9 10/23/2021   PLT 206 10/23/2021   Most recent BMP    Latest Ref Rng & Units 10/23/2021    9:47 AM  BMP  Glucose 70 - 99 mg/dL 144   BUN 6 - 20 mg/dL 48   Creatinine 0.61 - 1.24 mg/dL 3.37   Sodium 135 - 145 mmol/L 138   Potassium 3.5 - 5.1 mmol/L 3.8   Chloride 98 - 111 mmol/L 113   CO2 22 - 32 mmol/L 16   Calcium 8.9 - 10.3 mg/dL 7.9    Imaging/Diagnostic Tests: EKG Sinus rhythm without ST changes QT c 466    Chest XRay IMPRESSION: Patchy BILATERAL pulmonary infiltrates which could reflect pneumonia, pulmonary edema or alveolar hemorrhage. My impression: Bilateral fluffy pulmonary infiltrates likely reflecting bilateral pneumonia    CT Angio Chest w/ and w/o contrast IMPRESSION: 1. Motion artifacts at both lung bases with no definite pulmonary emboli seen. 2. Cardiomegaly and changes of acute congestive heart failure. 3. Extensive dense airspace opacity in both lungs, predominantly in a dependent distribution with a small right pleural effusion and minimal left pleural effusion. Differential considerations include dense alveolar edema and pleural effusions associated with acute CHF and extensive bilateral pneumonia with associated pleural effusions. 4. Multiple thyroid nodules measuring up to 2.1 cm in maximum diameter each, with progression. Recommend thyroid US (ref: J Am Coll Radiol. 2015 Feb;12(2):  143-50). 5.  Calcific coronary artery and aortic atherosclerosis.   Renal Ultrasound IMPRESSION:  No acute sonographic abnormality of the kidneys.    [name], Medical Student 10/24/2021, 11:02 AM  [place] Intern pager: [phone], text pages welcome Secure chat group [place]    I have evaluated this patient along with Student Dr. Orlena Sheldon and reviewed the above note, making necessary revisions.  Pearla Dubonnet, MD 10/24/2021, 11:46 AM PGY-2, [place]

## 2021-10-24 NOTE — Progress Notes (Signed)
Patient right arm circumference measured at 46 cm (approx 18.1 inches)

## 2021-10-24 NOTE — Care Management Important Message (Signed)
Important Message  Patient Details  Name: [name] MRN: [id number] Date of Birth: 12-Apr-1960   Medicare Important Message Given:  Yes     Orbie Pyo 10/24/2021, 2:57 PM

## 2021-10-24 NOTE — Assessment & Plan Note (Addendum)
Right sided weakness from prior CVA. Right arm swollen at baseline due to immobility and fluid overload.  - Monitor right arm circumference: 15cm 9/4 (stable)

## 2021-10-24 NOTE — Consult Note (Signed)
Consultation Note Date: 10/24/2021   Patient Name: [name]  DOB: October 11, 1960  MRN: [id number]  Age / Sex: 61 y.o., male  PCP: Roy Marble, MD Referring Physician: Kinnie Feil, MD  Reason for Consultation: Establishing goals of care  HPI/Patient Profile: 61 y.o. male  with past medical history of  CHF, Bipolar, MDD, DM2, HTN, TIA, hx of DVT, CKD3, and left BKA admitted on 10/19/2021 with fever, chills, worsening dyspnea.   Patient recently admitted for PNA. Now admitted for acute hypoxic respiratory failure due to PNA and CHF exacerbation, as well as AKI. PMT has been consulted to assist with goals of care conversation.  Clinical Assessment and Goals of Care:  I have reviewed medical records including EPIC notes, labs and imaging, assessed the patient and then had a phone conversation with patient's brother [name] to discuss diagnosis prognosis, [place], EOL wishes, disposition and options.  I introduced Palliative Medicine as specialized medical care for people living with serious illness. It focuses on providing relief from the symptoms and stress of a serious illness. The goal is to improve quality of life for both the patient and the family.  We discussed a brief life review of the patient and then focused on their current illness.   I attempted to elicit values and goals of care important to the patient.    Medical History Review and Understanding:  Reviewed patient's medical history in detail including several current acute illnesses and chronic comorbidities.  Social History: Patient resides at [facility]).  He has family support from his brother, sister, and father.  Functional and Nutritional State: He needs total assist for ADLs and mobilizes with [facility] lift.  Palliative Symptoms: Cough, depression  Advance Directives: A detailed discussion regarding advanced  directives was had.  No HCPOA on file.  Code Status: Concepts specific to code status, artifical feeding and hydration, and rehospitalization were considered and discussed.   Discussion: Met the bedside for goals of care conversation.  Created space and opportunity for patient's thoughts and feelings on his current illness.  He is appreciative of the good care he has received, noting that his has been a very difficult time for him and he is not sure what else can be done to improve his quality of life.  He has changed himself, telling me he used to be "hard headed" in the past about his health and actions. However, he feels it has "all been going downhill" since he started living at his current facility.  He seems to have some understanding of the severity of his illness, stating that he realizes "God can get me at any time" and that it is hard to think about whether he will make it through each night.  He endorses feeling depressed about his situation, although he tells me he is not currently taking any medication for this (takes Zoloft per [facility]).  [name] also shares that he took it the hardest when his mother died, as they previously spoke daily.  He is not afraid of end-of-life, but rather worries more about his sister and how she will take it when he does die.   He states he recently spoke to his father and informed him "I will do what ever they tell me to do."  Encourage patient to consider his own preferences, values, and shared decision making that prioritizes his specific goals and quality of life rather than agreeing to any available options presented to him. Reviewed the risks and benefits  of dialysis with the patient.  He tells me "I do not want to go through that" but that he also needs to think about it more.  Reassured patient that dialysis does not seem to be indicated at this time.  Counseled on the importance of anticipatory care planning.  Encouraged patient to involve his family as he is faced  with potential difficult decision making.  Also emphasized it is important to share when his treatments become more burdensome than helpful, as this is different for each person.  He initially is hesitant to include his family, stating he does not want to burden them, but then agrees it would be beneficial to have the extra support.  Called patient's to him who is agreeable to a family meeting and tells me Saturday would work best for him.  He is uncertain of exactly what time.   The difference between aggressive medical intervention and comfort care was considered in light of the patient's goals of care.   Discussed the importance of continued conversation with family and the medical providers regarding overall plan of care and treatment options, ensuring decisions are within the context of the patient's values and GOCs.   Questions and concerns were addressed. The family was encouraged to call with questions or concerns.  PMT will continue to support holistically.   SUMMARY OF RECOMMENDATIONS   -Continue full code/full scope treatment -Patient is uncertain whether he would proceed with HD if indicated -Patient's brother is available for family meeting to further discuss [place] on Saturday 9/2, will call PMT with a preferred time -Psychosocial and emotional support provided -PMT will continue to follow and support  Prognosis:  Guarded  Discharge Planning: To Be Determined      Primary Diagnoses: Present on Admission:  AKI (acute kidney injury) ([place])  PAD (peripheral artery disease) (HCC)  Pressure injury of skin  BPH (benign prostatic hyperplasia)  MDD (major depressive disorder), recurrent episode, severe ([place])  Diabetic neuropathy ([place])   Physical Exam Vitals and nursing note reviewed.  Constitutional:      General: He is not in acute distress.    Appearance: He is ill-appearing.     Comments: Frequent coughing  Cardiovascular:     Rate and Rhythm: Normal rate.  Pulmonary:      Effort: Pulmonary effort is normal.  Neurological:     Mental Status: He is alert.  Psychiatric:        Mood and Affect: Mood normal.        Speech: Speech is delayed.        Behavior: Behavior is cooperative.        Thought Content: Thought content normal.    Vital Signs: BP (!) 154/89 (BP Location: Left Arm)   Pulse 80   Temp 98.3 F (36.8 C) (Oral)   Resp 17   Ht 5' 8" (1.727 m)   Wt 125.8 kg   SpO2 95%   BMI 42.17 kg/m  Pain Scale: 0-10   Pain Score: 0-No pain   SpO2: SpO2: 95 % O2 Device:SpO2: 95 % O2 Flow Rate: .O2 Flow Rate (L/min): 2 L/min   Palliative Assessment/Data: 30% at best    MDM: high    Johnnette Litter, PA-C  Palliative Medicine Team Team phone # [phone]  Thank you for allowing the Palliative Medicine Team to assist in the care of this patient. Please utilize secure chat with additional questions, if there is no response within 30 minutes please call the above phone number.  Palliative Medicine Team providers are available by phone from 7am to 7pm daily and can be reached through the team cell phone.  Should this patient require assistance outside of these hours, please call the patient's attending physician.

## 2021-10-24 NOTE — Progress Notes (Signed)
Right arm circumference : 15 inches

## 2021-10-24 NOTE — Progress Notes (Signed)
Patient was admitted with acute hypoxic respiratory failure with fever and chills, also markedly volume overloaded, suspicious for community acquired pneumonia superimposed on a CHF exacerbation. Given that he lives in a SNF, TB was initially on the differential. Patient was placed on airborne precautions and QuantGold + 3x AFB Smears were ordered to rule out TB.  QuantGold sample was collected, but appears to have been lost somewhere along the line. His first AFB smear returned negative and second sample is in process. However, he has had marked improvement in his symptoms with Ceftriaxone and diuresis. He has been attempting to produce sputum for his AFB smear, but as he is no longer producing sputum s/p CTX, he has been unable to fill the third sample. He has been afebrile since day 1 of hospitalization. Given his rapid improvement with CTX and the fact that he remains afebrile and he is no longer producing sputum, we feel confident that we have an alternate explanation for his symptoms and can discontinue his TB precautions.  - D/c airborne precautions - Alternative diagnosis is: Community Acquired Pneumonia and HFpEF exacerbation. - He is stable on room air at this time - Remains volume overloaded, continue diuresis. Grateful for nephro's input.   Pearla Dubonnet, MD

## 2021-10-24 NOTE — Progress Notes (Addendum)
[facility] NEPHROLOGY PROGRESS NOTE  Assessment/ Plan: Pt is a 61 y.o. yo male  with past medical history of hypertension, diabetes, TIA, DVT, peripheral vascular disease with left BKA, bipolar mood disorder, CHF who was admitted on 8/25 for shortness of breath and fluid overload seen as a consultation for the management of acute kidney injury.   #Acute kidney injury on CKD stage III: Multifactorial etiology including IV contrast on 8/25,CHF exacerbation/PNA and severe anemia and +BOO.  UA with chronic proteinuria without RBC. Bladder scan with acute urinary retention requiring intermittent catheterization.  Kidney ultrasound unremarkable. Treated with IV Lasix, metolazone and albumin with increased urine output. The respiratory status is much better.  Noted the BUN and creatinine level went up yesterday therefore I am switching diuretics to p.o. Lasix.  Hopefully we can avoid dialysis and manage his volume with diuretics as an outpatient.  He has no signs or symptoms of uremia therefore no need for HD at this time.  Pending lab results from today. Continue with strict ins and out, daily lab.     #Acute on chronic diastolic CHF:  Echo with grade 2 diastolic dysfunction.  Breathing is much better but he still has some peripheral edema.  Switching to oral diuretics.  Cardiology is following.   #Metabolic acidosis: Increased oral sodium bicarbonate.  Monitor serum CO2 level.   #Acute hypoxic respiratory failure due to HCAP and CHF exacerbation: On antibiotics for pneumonia per primary team. Diuretics as above. Aspiratory status much improved and currently on room air.   #Iron deficiency anemia: Iron saturation is only 5%, hold IV iron now because of him being on antibiotics.  Recommend evaluation for GI bleed.  Received blood transfusion.  Received Aranesp on 8/29.   # HTN: Currently on amlodipine.  Diuretics as above to manage volume.  Follow lab results from  today.   Subjective: Seen and examined at bedside.  Urine output of around 2.3 L in 24 hours.  He reports that his breathing is much better and denies chest pain, shortness of breath, nausea, vomiting, dysgeusia.  Objective Vital signs in last 24 hours: Vitals:   10/23/21 2248 10/24/21 0000 10/24/21 0534 10/24/21 0943  BP: (!) 155/92  (!) 151/91 (!) 154/89  Pulse: 76  74 80  Resp: '18  16 17  '$ Temp: 97.6 F (36.4 C)  97.7 F (36.5 C) 98.3 F (36.8 C)  TempSrc: Oral  Oral Oral  SpO2: 99%  99% 95%  Weight:      Height:  '5\' 8"'$  (1.727 m)     Weight change:   Intake/Output Summary (Last 24 hours) at 10/24/2021 1107 Last data filed at 10/24/2021 0601 Gross per 24 hour  Intake 880 ml  Output 2300 ml  Net -1420 ml        Labs: RENAL PANEL Recent Labs    12/01/20 0837 12/02/20 1328 12/03/20 1610 12/04/20 1317 12/05/20 0832 12/07/20 0254 12/19/20 0006 02/28/21 2350 03/02/21 0242 03/03/21 0818 03/04/21 0730 03/05/21 6606 03/06/21 0353 03/07/21 0809 03/08/21 0115 03/09/21 0433 04/12/21 0551 10/19/21 1232 10/19/21 1840 10/20/21 0449 10/20/21 1259 10/21/21 0234 10/21/21 1501 10/22/21 0751 10/23/21 0947  NA 139 133* 133* 133* 132*   < > 134* 129*   < > 134* 132* 133* 134* 130* '136 140 140 140 139 140 140 139 136 139 '$ 138  K 4.2 3.8 4.4 4.1 4.6   < > 3.9 4.1   < > 4.0 3.9 3.5 3.0* 3.1* 3.9 4.5 3.1* 5.8* 5.4* 4.9  5.0 4.6 4.7 4.2 3.8  CL 107 105 105 104 105   < > 99 101   < > 107 105 100 98 91* 98 104 111 115* 116* 118* 117* 116* 113* 115* 113*  CO2 25 23 21* 23 22   < > 25 16*   < > 15* 14* 20* '22 28 27 28 '$ 19* 16* 17* 16* 16* 16* 15* 15* 16*  GLUCOSE 143* 158* 197* 209* 176*   < > 143* 210*   < > 176* 145* 140* 142* 154* 158* 128* 121* 122* 116* 96 151* 124* 163* 121* 144*  BUN '20 20 19 18 17   '$ < > 32* 87*   < > 95* 94* 88* 82* 74* 65* 53* 18 40* 42* 42* 43* 45* 44* 45* 48*  CREATININE 1.31* 1.50* 1.25* 1.31* 1.42*   < > 1.25* 2.33*   < > 2.91* 2.85* 2.38* 1.93* 1.69*  1.46* 1.21 0.74 2.03* 1.97* 2.06* 2.20* 2.19* 2.42* 2.87* 3.37*  CALCIUM 8.6* 8.0* 8.2* 8.0* 7.8*   < > 8.4* 8.3*   < > 8.2* 8.0* 7.7* 7.7* 7.4* 7.5* 7.8* 8.2* 8.9 8.4* 8.4* 8.3* 7.9* 7.8* 7.8* 7.9*  MG 2.0 1.8 2.3 2.1 1.9   < > 1.9  --   --  2.2 2.0 1.8 1.9 1.8 1.7 1.9  --   --   --   --  1.9 1.9  --   --   --   PHOS 3.3 3.3 3.5 2.7 2.5  --   --   --   --  6.3* 6.1* 4.8* 4.6  --   --   --   --   --   --   --   --   --   --   --  5.9*  ALBUMIN 2.7* 2.6* 2.8* 2.6* 2.6*  --   --  2.1*  --  2.0* 2.0* 1.9* 1.9*  --   --   --  2.2* 2.8*  --  2.0*  --   --   --   --  2.3*   < > = values in this interval not displayed.      Liver Function Tests: Recent Labs  Lab 10/19/21 1232 10/20/21 0449 10/23/21 0947  AST 16 10*  --   ALT 20 15  --   ALKPHOS 126 91  --   BILITOT 0.8 0.5  --   PROT 7.5 6.0*  --   ALBUMIN 2.8* 2.0* 2.3*    Recent Labs  Lab 10/19/21 1232  LIPASE 20    No results for input(s): "AMMONIA" in the last 168 hours. CBC: Recent Labs    03/02/21 1151 03/03/21 0818 10/20/21 0449 10/21/21 0234 10/21/21 0456 10/21/21 1501 10/22/21 0751 10/23/21 0947  HGB 7.0*   < >  --  6.4* 6.6* 7.4* 7.6* 8.2*  MCV  --    < >  --  82.2  --   --  81.1 80.9  VITAMINB12 471  --  439  --   --   --   --   --   FOLATE 7.2  --  6.9  --   --   --   --   --   FERRITIN 365*  --  164  --   --   --   --   --   TIBC 167*  --  227*  --   --   --   --   --   IRON  14*  --  12*  --   --   --   --   --   RETICCTPCT 1.1  --  2.6  --   --   --   --   --    < > = values in this interval not displayed.     Cardiac Enzymes: No results for input(s): "CKTOTAL", "CKMB", "CKMBINDEX", "TROPONINI" in the last 168 hours. CBG: Recent Labs  Lab 10/22/21 2315 10/23/21 0723 10/23/21 1156 10/23/21 1700 10/24/21 0759  GLUCAP 181* 137* 143* 160* 182*     Iron Studies: No results for input(s): "IRON", "TIBC", "TRANSFERRIN", "FERRITIN" in the last 72 hours. Studies/Results: US RENAL  Result Date:  10/23/2021 CLINICAL DATA:  Acute kidney injury EXAM: RENAL / URINARY TRACT ULTRASOUND COMPLETE COMPARISON:  01/09/2020 FINDINGS: Right Kidney: Renal measurements: 12.0 x 5.6 x 6.4 cm = volume: 187 mL. Echogenicity within normal limits. No mass or hydronephrosis visualized. Limited visualization of the upper pole. Left Kidney: Renal measurements: 12.7 x 6.2 x 5.3 cm = volume: 219 mL. Echogenicity within normal limits. No mass or hydronephrosis visualized. Bladder: Appears normal for degree of bladder distention. Other: None. IMPRESSION: No acute sonographic abnormality of the kidneys. Electronically Signed   By: Miachel Roux M.D.   On: 10/23/2021 09:03    Medications: Infusions:  cefTRIAXone (ROCEPHIN)  IV 2 g (10/24/21 0851)   furosemide 100 mg (10/24/21 0601)    Scheduled Medications:  amLODipine  10 mg Oral Daily   ascorbic acid  500 mg Oral BID   aspirin  81 mg Oral Daily   atorvastatin  80 mg Oral QHS   brimonidine  1 drop Both Eyes BID   dorzolamide-timolol  1 drop Both Eyes BID   ferrous sulfate  325 mg Oral QODAY   latanoprost  1 drop Both Eyes QHS   metolazone  5 mg Oral Daily   pregabalin  75 mg Oral Daily   sertraline  50 mg Oral QHS   sodium bicarbonate  1,300 mg Oral BID   tamsulosin  0.8 mg Oral Daily    have reviewed scheduled and prn medications.  Physical Exam: General:NAD, comfortable Heart:RRR, s1s2 nl, no rubs Lungs: Clear bilateral, no increased work of breathing. Abdomen:soft, Non-tender, non-distended Extremities: b/l legs pitting edema+, looks chronic Neurology: Alert, awake, nonfocal and no asterixis  Hadia Minier Prasad Kaloni Bisaillon 10/24/2021,11:07 AM  LOS: 5 days

## 2021-10-25 DIAGNOSIS — I5033 Acute on chronic diastolic (congestive) heart failure: Secondary | ICD-10-CM

## 2021-10-25 DIAGNOSIS — E1149 Type 2 diabetes mellitus with other diabetic neurological complication: Secondary | ICD-10-CM

## 2021-10-25 DIAGNOSIS — J9601 Acute respiratory failure with hypoxia: Secondary | ICD-10-CM | POA: Diagnosis not present

## 2021-10-25 DIAGNOSIS — J189 Pneumonia, unspecified organism: Secondary | ICD-10-CM | POA: Diagnosis not present

## 2021-10-25 DIAGNOSIS — I10 Essential (primary) hypertension: Secondary | ICD-10-CM

## 2021-10-25 DIAGNOSIS — I739 Peripheral vascular disease, unspecified: Secondary | ICD-10-CM

## 2021-10-25 DIAGNOSIS — N179 Acute kidney failure, unspecified: Secondary | ICD-10-CM | POA: Diagnosis not present

## 2021-10-25 LAB — CBC
HCT: 24.9 % — ABNORMAL LOW (ref 39.0–52.0)
Hemoglobin: 7.8 g/dL — ABNORMAL LOW (ref 13.0–17.0)
MCH: 25.3 pg — ABNORMAL LOW (ref 26.0–34.0)
MCHC: 31.3 g/dL (ref 30.0–36.0)
MCV: 80.8 fL (ref 80.0–100.0)
Platelets: 211 10*3/uL (ref 150–400)
RBC: 3.08 MIL/uL — ABNORMAL LOW (ref 4.22–5.81)
RDW: 16.9 % — ABNORMAL HIGH (ref 11.5–15.5)
WBC: 3.8 10*3/uL — ABNORMAL LOW (ref 4.0–10.5)
nRBC: 0 % (ref 0.0–0.2)

## 2021-10-25 LAB — RENAL FUNCTION PANEL
Albumin: 2 g/dL — ABNORMAL LOW (ref 3.5–5.0)
Anion gap: 8 (ref 5–15)
BUN: 47 mg/dL — ABNORMAL HIGH (ref 6–20)
CO2: 18 mmol/L — ABNORMAL LOW (ref 22–32)
Calcium: 7.7 mg/dL — ABNORMAL LOW (ref 8.9–10.3)
Chloride: 113 mmol/L — ABNORMAL HIGH (ref 98–111)
Creatinine, Ser: 3.74 mg/dL — ABNORMAL HIGH (ref 0.61–1.24)
GFR, Estimated: 18 mL/min — ABNORMAL LOW (ref >60)
Glucose, Bld: 138 mg/dL — ABNORMAL HIGH (ref 70–99)
Phosphorus: 6 mg/dL — ABNORMAL HIGH (ref 2.5–4.6)
Potassium: 3.8 mmol/L (ref 3.5–5.1)
Sodium: 139 mmol/L (ref 135–145)

## 2021-10-25 LAB — GLUCOSE, CAPILLARY
Glucose-Capillary: 180 mg/dL — ABNORMAL HIGH (ref 70–99)
Glucose-Capillary: 157 mg/dL — ABNORMAL HIGH (ref 70–99)
Glucose-Capillary: 162 mg/dL — ABNORMAL HIGH (ref 70–99)

## 2021-10-25 LAB — CULTURE, BLOOD (ROUTINE X 2)
Culture: NO GROWTH
Culture: NO GROWTH
Special Requests: ADEQUATE
Special Requests: ADEQUATE

## 2021-10-25 LAB — MAGNESIUM: Magnesium: 1.9 mg/dL (ref 1.7–2.4)

## 2021-10-25 MED ORDER — PANTOPRAZOLE 2 MG/ML SUSPENSION
40.0000 mg | Freq: Every day | ORAL | Status: DC
Start: 2021-10-25 — End: 2021-10-26
  Administered 2021-10-25: 40 mg
  Filled 2021-10-25 (×2): qty 20

## 2021-10-25 MED ORDER — CALCIUM ACETATE (PHOS BINDER) 667 MG PO CAPS
667.0000 mg | ORAL_CAPSULE | Freq: Three times a day (TID) | ORAL | Status: DC
  Administered 2021-10-25 – 2021-11-01 (×20): 667 mg via ORAL
  Filled 2021-10-25 (×21): qty 1

## 2021-10-25 MED ORDER — SODIUM CHLORIDE 0.9 % IV SOLN
2.0000 g | INTRAVENOUS | Status: AC
  Administered 2021-10-25: 2 g via INTRAVENOUS
  Filled 2021-10-25: qty 20

## 2021-10-25 MED ORDER — HEPARIN SODIUM (PORCINE) 5000 UNIT/ML IJ SOLN
5000.0000 [IU] | Freq: Three times a day (TID) | INTRAMUSCULAR | Status: DC
  Administered 2021-10-25 – 2021-11-01 (×21): 5000 [IU] via SUBCUTANEOUS
  Filled 2021-10-25 (×22): qty 1

## 2021-10-25 MED ORDER — NA FERRIC GLUC CPLX IN SUCROSE 12.5 MG/ML IV SOLN
250.0000 mg | Freq: Every day | INTRAVENOUS | Status: AC
  Administered 2021-10-25 – 2021-10-28 (×4): 250 mg via INTRAVENOUS
  Filled 2021-10-25 (×4): qty 20

## 2021-10-25 MED ORDER — CHLORHEXIDINE GLUCONATE CLOTH 2 % EX PADS
6.0000 | MEDICATED_PAD | Freq: Every day | CUTANEOUS | Status: DC
  Administered 2021-10-25 – 2021-10-31 (×7): 6 via TOPICAL

## 2021-10-25 NOTE — Progress Notes (Addendum)
Daily Progress Note Intern Pager: [phone]  Patient name: [name] Medical record number: [id number] Date of birth: 09-28-1960 Age: 61 y.o. Gender: male  Primary Care Provider: Jodi Marble, MD Consultants: Cardiology, Nephrology, Palliative, Nutrition Code Status: FULL  Pt Overview and Major Events to Date:  8/25 - admitted  Assessment and Plan: [name] is a 61 y.o. 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 We are monitoring his fluid status while diuresing, treating pneumonia, managing AKI, and consulting palliative for GOD discussion given baseline disability and multiple acute processes. Plan for family meeting with palliative on 10/27/21.   * AKI (acute kidney injury) ([place]) Acute on chronic kidney disease IIIa. Baseline last recorded Cr 0.74 and GFR >60 in Feb 2023. Most recent Creatinine 3.74 and GFR 18. Per renal, multifactorial etiology including IV contrast on admission, CHF exacerbation/PNA (prerenal) and severe anemia. Renal ultrasound showed no acute abnormality. Most recent output 0.9 mL/kg/hr. Completed IV albumin per nephro. Renal function panel revealed elevated phosphorous 6.1 and low albumin of 2.0.Corrected calcium 9.3.   - Nephrology following, appreciate care and recommendations  - Continue Lasix to 80 mg PO BID  - Discontinue metolazone 5 mg  - Start phosphate binder - Placed nutrition consult due to low albumin  - Continue to trend daily BMP  - Avoid nephrotoxic agents - Strict I/Os and daily weights  - Foley in place; consider d/c when appropriate  - Consider HD if no improvement in renal function  - Placed palliative consult, plan for family meeting to discuss [place] 9/2  Pneumonia Originally septic with fever. Blood cultures positive for  staph species, two samples drawn from same location, likely contaminated with skin flora. Repeat blood cultures NG5D. Completed Azithro 3 day course for PNA which was confirmed with imaging findings, fever, and sputum production, finishing CTX today. - Continue CTX day 7/7 - Flutter valve and incentive spirometry  Iron deficiency anemia Hx of microcytic anemia. Iron and TIBC panel on 8/26 revealed low TIBC and 5% saturation ratio, pointing to an IDA picture. Hb 8.7 and Hct 30 at baseline, 6.4 on morning of 10/21/21. 1 unit PRBC given and post transfusion h/h 7.4 and 23.7. Per nephro, given 60 mcg injection of Aranesp.  - Continue every other day iron 325 mg PO with breakfast, continue home vitamin C. Plan to switch to IV iron after completes course of CTX before d/c - Nutrition consult placed  - Trend daily H&H (most recently at baseline Hb 7.8)  - Threshold for transfusion <7 - Start IV iron x 4 doses - Consider workup for chronic GI bleed if he requires another transfusion  - Start 40 mg Protonix    Swelling of arm Right sided weakness from prior CVA. Right arm swollen at baseline due to immobility and fluid overload. Currently no redness or streaking noted. No hx of DVT. - Monitor right arm circumference: 15 inches - US showed no DVT    Metabolic acidosis CO2 18 today. Most likely secondary to acute PNA on chronic CHF with exacerbation in addition to CKD stage IIIa.  - Continue sodium bicarbonate tablet 1,300 mg PO BID, plan to stop in over next few days to balance with goal of diuresing sodium   (HFpEF) heart failure with preserved ejection fraction (  [place]) Echo 10/21/21 revealed EF of 60-65% and Grade 2 diastolic dysfunction with moderate pulmonary hypertension and small pericardial effusion. BNP 547 elevated from baseline. - Strict I/Os and daily weights  - Diurese per nephrology     Type 2 diabetes mellitus (HCC) Chronic problem. On sliding scale lispro at home. Glucose range  120-180s. Hb A1c 5.8. - CBG checks AC HS - Consider starting sliding scale insulin if sugars persistently >180  - Encourage nutrition with diabetic diet   Multiple thyroid nodules Chest CT angio revealed "multiple thyroid nodules measuring up to 2.1 cm in maximum diameter each, with progression.  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  - On home atorvastatin 80 mg  - [facility] at [facility], PT/OT consult placed, appreciate recs   Pressure injury of skin Sacral ulcer present per SNF documentation. Unable to visualize due to inability to turn patient without multiple staff assistance. - Wound care nurse consulted, appreciate care and recs   BPH (benign prostatic hyperplasia) Chronic, home Flomax ordered  Diabetic neuropathy (HCC) Chronic. On home Lyrica 75 mg daily  MDD (major depressive disorder), recurrent episode, severe (HCC) Mood and affect currently appropriate to situation, on home Zoloft 60 mg   Hypertension Chronic  -On home amlodipine 10 mg PO daily   Fever-resolved as of 10/20/2021 Fever of 100.8 in ED, likely pneumonia.     FEN/GI: Regular  PPx: Heparin 5,000 units q8h Dispo:SNF pending clinical improvement . Barriers include diuresing for fluid overload, worsening AKI, and 10 day course of CTX for pneumonia.   Subjective:  Patient was seen at bedside, resting comfortably. He does not remember meeting with nephro or palliative, he says he has "met a lot of people." Seems to have some short-term memory loss. Denies chest pain, abdominal pain, pain anywhere. Asked for right leg to be adjusted so it seems to be bothering him.   Objective: Temp:  [98.3 F (36.8 C)-98.7 F (37.1 C)] 98.7 F (37.1 C) (08/31 1100) Pulse Rate:  [74-79] 77 (08/31 1100) Resp:  [16-20] 20 (08/31 1100) BP: (148-164)/(89-96) 164/92 (08/31 1100) SpO2:  [97 %-100 %] 100 % (08/31  1100) Weight:  [127.7 kg] 127.7 kg (08/31 0500)  Physical Exam: General: Alert and oriented x 4, right sided hemiplegia, pleasant and conversational Cardiovascular: RRR S1 S2 present, murmur 4-3/1 systolic, fluid overloaded with 2+ pitting edema in right arm and lower extremities Respiratory: Rales bilaterally heard only on exhalation. Lungs sounding better every day. Slightly increased WOB. Not producing sputum.  Abdomen: Soft, not tender to palpation. Extremities: Left BKA, R arm with 2+ pitting edema with no streaking or erythema.    Laboratory: Most recent CBC Lab Results  Component Value Date   WBC 3.8 (L) 10/25/2021   HGB 7.8 (L) 10/25/2021   HCT 24.9 (L) 10/25/2021   MCV 80.8 10/25/2021   PLT 211 10/25/2021   Most recent BMP    Latest Ref Rng & Units 10/25/2021    4:51 AM  BMP  Glucose 70 - 99 mg/dL 138   BUN 6 - 20 mg/dL 47   Creatinine 0.61 - 1.24 mg/dL 3.74   Sodium 135 - 145 mmol/L 139   Potassium 3.5 - 5.1 mmol/L 3.8   Chloride 98 - 111 mmol/L 113   CO2 22 - 32 mmol/L 18   Calcium 8.9 - 10.3 mg/dL 7.7    EKG Sinus rhythm without  ST changes QT c 466    Chest XRay IMPRESSION: Patchy BILATERAL pulmonary infiltrates which could reflect pneumonia, pulmonary edema or alveolar hemorrhage. My impression: Bilateral fluffy pulmonary infiltrates likely reflecting bilateral pneumonia    CT Angio Chest w/ and w/o contrast IMPRESSION: 1. Motion artifacts at both lung bases with no definite pulmonary emboli seen. 2. Cardiomegaly and changes of acute congestive heart failure. 3. Extensive dense airspace opacity in both lungs, predominantly in a dependent distribution with a small right pleural effusion and minimal left pleural effusion. Differential considerations include dense alveolar edema and pleural effusions associated with acute CHF and extensive bilateral pneumonia with associated pleural effusions. 4. Multiple thyroid nodules measuring up to 2.1 cm in  maximum diameter each, with progression. Recommend thyroid US (ref: J Am Coll Radiol. 2015 Feb;12(2): 143-50). 5.  Calcific coronary artery and aortic atherosclerosis.   Renal Ultrasound IMPRESSION:  No acute sonographic abnormality of the kidneys.   Bilateral Vascular US Upper Extremities:  No DVT in upper extremity on right and left side, however significant subcutaneous edema throughout right UE.    Lupita Raider, Medical Student 10/25/2021, 11:51 AM  [facility] Intern pager: [phone], text pages welcome Secure chat group [facility] Upper-Level Resident Addendum   I have independently interviewed and examined the patient. I have discussed the above with the original author and agree with their documentation. My edits for correction/addition/clarification are included where appropriate. Please see also any attending notes.   Sharion Settler, DO PGY-3, [facility] 10/25/2021 2:46 PM  [facility] Service pager: [phone] (text pages welcome through [facility])

## 2021-10-25 NOTE — Progress Notes (Signed)
[facility] NEPHROLOGY PROGRESS NOTE  Assessment/ Plan: Pt is a 61 y.o. yo male  with past medical history of hypertension, diabetes, TIA, DVT, peripheral vascular disease with left BKA, bipolar mood disorder, CHF who was admitted on 8/25 for shortness of breath and fluid overload seen as a consultation for the management of acute kidney injury.   #Acute kidney injury on CKD stage III: Multifactorial etiology including IV contrast on 8/25,CHF exacerbation/PNA and severe anemia and +BOO.  UA with chronic proteinuria without RBC. Bladder scan with acute urinary retention requiring intermittent catheterization.  Kidney ultrasound unremarkable. Treated with IV Lasix, metolazone and albumin with increased urine output. The respiratory status is much better.  Switched to oral Lasix 80 mg twice a day.  Given rising creatinine level I will discontinue metolazone. Hopefully we can avoid dialysis and manage his volume with diuretics as an outpatient.  Fortunately, no need of HD at this time.  Continue with strict ins and out, daily lab.     #Acute on chronic diastolic CHF:  Echo with grade 2 diastolic dysfunction.  Breathing is much better but he still has some peripheral edema which seems to be chronic.  Continue diuretics.  Cardiology is following.   #Metabolic acidosis: Increased oral sodium bicarbonate.  Monitor serum CO2 level.   #Acute hypoxic respiratory failure due to HCAP and CHF exacerbation: On antibiotics for pneumonia per primary team. Diuretics as above. Respiratory status much improved and currently on room air.   #Iron deficiency anemia: Iron saturation is only 5%, hold IV iron now because of him being on antibiotics.  Recommend evaluation for GI bleed.  Received blood transfusion.  Received Aranesp on 8/29.   # HTN: Currently on amlodipine.  Diuretics as above to manage volume.  Subjective: Seen and examined at bedside.  Urine output is around 2.7 L in 24 hours.  No new  event.  He is on room air and denies chest pain or shortness of breath.  No uremic symptoms. Objective Vital signs in last 24 hours: Vitals:   10/24/21 1649 10/24/21 2120 10/25/21 0500 10/25/21 0515  BP: (!) 164/96 (!) 154/89  (!) 148/91  Pulse: 79 74  74  Resp: '17 16  18  '$ Temp: 98.3 F (36.8 C) 98.5 F (36.9 C)  98.4 F (36.9 C)  TempSrc:  Oral  Oral  SpO2: 100% 98%  97%  Weight:   127.7 kg   Height:       Weight change:   Intake/Output Summary (Last 24 hours) at 10/25/2021 1024 Last data filed at 10/25/2021 0515 Gross per 24 hour  Intake 460 ml  Output 2700 ml  Net -2240 ml        Labs: RENAL PANEL Recent Labs    12/03/20 1610 12/04/20 1317 12/05/20 0832 12/07/20 0254 03/03/21 0818 03/04/21 0730 03/05/21 8315 03/06/21 0353 03/07/21 0809 03/08/21 0115 03/09/21 0433 04/12/21 0551 10/19/21 1232 10/19/21 1840 10/20/21 0449 10/20/21 1259 10/21/21 0234 10/21/21 1501 10/22/21 0751 10/23/21 0947 10/24/21 1051 10/25/21 0451  NA 133* 133* 132*   < > 134* 132* 133* 134* 130* '136 140 140 140 139 140 140 139 136 139 '$ 138 138 139  K 4.4 4.1 4.6   < > 4.0 3.9 3.5 3.0* 3.1* 3.9 4.5 3.1* 5.8* 5.4* 4.9 5.0 4.6 4.7 4.2 3.8 4.1 3.8  CL 105 104 105   < > 107 105 100 98 91* 98 104 111 115* 116* 118* 117* 116* 113* 115* 113* 113* 113*  CO2 21*  23 22   < > 15* 14* 20* '22 28 27 28 '$ 19* 16* 17* 16* 16* 16* 15* 15* 16* 17* 18*  GLUCOSE 197* 209* 176*   < > 176* 145* 140* 142* 154* 158* 128* 121* 122* 116* 96 151* 124* 163* 121* 144* 149* 138*  BUN '19 18 17   '$ < > 95* 94* 88* 82* 74* 65* 53* 18 40* 42* 42* 43* 45* 44* 45* 48* 47* 47*  CREATININE 1.25* 1.31* 1.42*   < > 2.91* 2.85* 2.38* 1.93* 1.69* 1.46* 1.21 0.74 2.03* 1.97* 2.06* 2.20* 2.19* 2.42* 2.87* 3.37* 3.58* 3.74*  CALCIUM 8.2* 8.0* 7.8*   < > 8.2* 8.0* 7.7* 7.7* 7.4* 7.5* 7.8* 8.2* 8.9 8.4* 8.4* 8.3* 7.9* 7.8* 7.8* 7.9* 7.7* 7.7*  MG 2.3 2.1 1.9   < > 2.2 2.0 1.8 1.9 1.8 1.7 1.9  --   --   --   --  1.9 1.9  --   --   --    --  1.9  PHOS 3.5 2.7 2.5  --  6.3* 6.1* 4.8* 4.6  --   --   --   --   --   --   --   --   --   --   --  5.9* 6.1* 6.0*  ALBUMIN 2.8* 2.6* 2.6*   < > 2.0* 2.0* 1.9* 1.9*  --   --   --  2.2* 2.8*  --  2.0*  --   --   --   --  2.3* 2.0* 2.0*   < > = values in this interval not displayed.      Liver Function Tests: Recent Labs  Lab 10/19/21 1232 10/20/21 0449 10/23/21 0947 10/24/21 1051 10/25/21 0451  AST 16 10*  --   --   --   ALT 20 15  --   --   --   ALKPHOS 126 91  --   --   --   BILITOT 0.8 0.5  --   --   --   PROT 7.5 6.0*  --   --   --   ALBUMIN 2.8* 2.0* 2.3* 2.0* 2.0*    Recent Labs  Lab 10/19/21 1232  LIPASE 20    No results for input(s): "AMMONIA" in the last 168 hours. CBC: Recent Labs    03/02/21 1151 03/03/21 0818 10/20/21 0449 10/21/21 0234 10/21/21 1501 10/22/21 0751 10/23/21 0947 10/24/21 1051 10/25/21 0451  HGB 7.0*   < >  --    < > 7.4* 7.6* 8.2* 7.5* 7.8*  MCV  --    < >  --    < >  --  81.1 80.9 82.0 80.8  VITAMINB12 471  --  439  --   --   --   --   --   --   FOLATE 7.2  --  6.9  --   --   --   --   --   --   FERRITIN 365*  --  164  --   --   --   --   --   --   TIBC 167*  --  227*  --   --   --   --   --   --   IRON 14*  --  12*  --   --   --   --   --   --   RETICCTPCT 1.1  --  2.6  --   --   --   --   --   --    < > =  values in this interval not displayed.     Cardiac Enzymes: No results for input(s): "CKTOTAL", "CKMB", "CKMBINDEX", "TROPONINI" in the last 168 hours. CBG: Recent Labs  Lab 10/23/21 1700 10/24/21 0759 10/24/21 1129 10/24/21 1650 10/24/21 2120  GLUCAP 160* 182* 143* 147* 164*     Iron Studies: No results for input(s): "IRON", "TIBC", "TRANSFERRIN", "FERRITIN" in the last 72 hours. Studies/Results: VAS [place] UPPER EXTREMITY VENOUS DUPLEX  Result Date: 10/24/2021 UPPER VENOUS STUDY  Patient Name:  FINNICK OROSZ  Date of Exam:   10/24/2021 Medical Rec #: [id number]      Accession #:    [id number] Date of Birth:  1960-08-21       Patient Gender: M Patient Age:   65 years Exam Location:  [facility] Procedure:      VAS [place] UPPER EXTREMITY VENOUS DUPLEX Referring Phys: Andrena Mews --------------------------------------------------------------------------------  Indications: Swelling RT>LT, per patient- no history of DVT, patient acknowledges multiple prior negative examinations Comparison       08-22-2020 Prior upper extremity venous right was negative for Study:           DVT. Performing Technologist: Darlin Coco RDMS, RVT  Examination Guidelines: A complete evaluation includes B-mode imaging, spectral Doppler, color Doppler, and power Doppler as needed of all accessible portions of each vessel. Bilateral testing is considered an integral part of a complete examination. Limited examinations for reoccurring indications may be performed as noted.  Right Findings: +----------+------------+---------+-----------+----------+-------+ RIGHT     CompressiblePhasicitySpontaneousPropertiesSummary +----------+------------+---------+-----------+----------+-------+ IJV           Full       Yes       Yes                      +----------+------------+---------+-----------+----------+-------+ Subclavian    Full       Yes       Yes                      +----------+------------+---------+-----------+----------+-------+ Axillary      Full       Yes       Yes                      +----------+------------+---------+-----------+----------+-------+ Brachial      Full                                          +----------+------------+---------+-----------+----------+-------+ Radial        Full                                          +----------+------------+---------+-----------+----------+-------+ Ulnar         Full                                          +----------+------------+---------+-----------+----------+-------+ Cephalic      Full                                           +----------+------------+---------+-----------+----------+-------+ Basilic  Full                                          +----------+------------+---------+-----------+----------+-------+  Left Findings: +----------+------------+---------+-----------+----------+-------+ LEFT      CompressiblePhasicitySpontaneousPropertiesSummary +----------+------------+---------+-----------+----------+-------+ IJV           Full       Yes       Yes                      +----------+------------+---------+-----------+----------+-------+ Subclavian    Full       Yes       Yes                      +----------+------------+---------+-----------+----------+-------+ Axillary      Full                                          +----------+------------+---------+-----------+----------+-------+ Brachial      Full                                          +----------+------------+---------+-----------+----------+-------+ Radial        Full                                          +----------+------------+---------+-----------+----------+-------+ Ulnar         Full                                          +----------+------------+---------+-----------+----------+-------+ Cephalic      Full                                          +----------+------------+---------+-----------+----------+-------+ Basilic       Full                                          +----------+------------+---------+-----------+----------+-------+  Summary:  Right: No evidence of deep vein thrombosis in the upper extremity. No evidence of superficial vein thrombosis in the upper extremity. Significant subcutaneous edema noted throughout the right upper extremity.  Left: No evidence of deep vein thrombosis in the upper extremity. No evidence of superficial vein thrombosis in the upper extremity.  *See table(s) above for measurements and observations.  Diagnosing physician: Jamelle Haring  Electronically signed by Jamelle Haring on 10/24/2021 at 4:54:12 PM.    Final     Medications: Infusions:  cefTRIAXone (ROCEPHIN)  IV Stopped (10/24/21 8003)    Scheduled Medications:  amLODipine  10 mg Oral Daily   ascorbic acid  500 mg Oral BID   aspirin  81 mg Oral Daily   atorvastatin  80 mg Oral QHS   brimonidine  1 drop Both Eyes BID   Chlorhexidine Gluconate Cloth  6 each Topical Daily   dorzolamide-timolol  1 drop Both Eyes BID   ferrous sulfate  325 mg Oral QODAY   furosemide  80 mg Oral BID   latanoprost  1 drop Both Eyes QHS   pregabalin  75 mg Oral Daily   sertraline  50 mg Oral QHS   sodium bicarbonate  1,300 mg Oral BID   tamsulosin  0.8 mg Oral Daily    have reviewed scheduled and prn medications.  Physical Exam: General:NAD, comfortable Heart:RRR, s1s2 nl, no rubs Lungs: Clear bilateral, no increased work of breathing. Abdomen:soft, Non-tender, non-distended Extremities: b/l legs pitting edema+, looks chronic Neurology: Alert, awake, nonfocal and no asterixis  Eldra Word Prasad Corgan Mormile 10/25/2021,10:24 AM  LOS: 6 days

## 2021-10-25 NOTE — Progress Notes (Signed)
Daily Progress Note   Patient Name: [name]       Date: 10/25/2021 DOB: [birth year]  Age: 61 y.o. MRN#: [id number] Attending Physician: Leeanne Rio, MD Primary Care Physician: Jodi Marble, MD Admit Date: 10/19/2021  Reason for Consultation/Follow-up: Establishing goals of care  Subjective: Medical records reviewed including progress notes, labs. Patient assessed at the bedside. Discussed with RN.  No family present during my visit.  Created space and opportunity for patient's thoughts and feelings on his current illness, as well as provided updates on his significant AKI and rising creatinine.  He is disappointed to hear this, tells me that he will do dialysis as a "last resort" although he does not really want to.  Reassured him that it is not indicated at this time and also emphasized the importance of planning in advanced for all possible outcomes.  Encouraged him to continue to consider this potential decision with his goals in mind.  His main goal is to see his father's face in person again.  He turned 89 in June and patient has been unable to spend time with him.  Also provided patient with update that his brother was agreeable to goals of care discussion on Saturday tentatively.  Patient at tells me it is difficult to have previously been such a "tough" guy and now having difficulty with his ability to control his life and have independence.  We discussed the years he enjoyed in [place].  Emotional support and therapeutic listening was provided.  He is Patent attorney.  Questions and concerns addressed. PMT will continue to support holistically.   Length of Stay: 6  Physical Exam Vitals and nursing note reviewed.  Constitutional:      General: He is not in acute  distress.    Appearance: He is ill-appearing.  Cardiovascular:     Rate and Rhythm: Normal rate.  Pulmonary:     Effort: Pulmonary effort is normal.  Neurological:     Mental Status: He is alert.  Psychiatric:        Mood and Affect: Mood is depressed.        Behavior: Behavior is cooperative.            Vital Signs: BP (!) 156/96   Pulse 76   Temp 98.6 F (37 C)   Resp  19   Ht '5\' 8"'$  (1.727 m)   Wt 127.7 kg   SpO2 100%   BMI 42.81 kg/m  SpO2: SpO2: 100 % O2 Device: O2 Device: Room Air O2 Flow Rate: O2 Flow Rate (L/min): 2 L/min      Palliative Assessment/Data: 30%    Palliative Care Assessment & Plan   Patient Profile: 61 y.o. male  with past medical history of  CHF, Bipolar, MDD, DM2, HTN, TIA, hx of DVT, CKD3, and left BKA admitted on 10/19/2021 with fever, chills, worsening dyspnea.    Patient recently admitted for PNA. Now admitted for acute hypoxic respiratory failure due to PNA and CHF exacerbation, as well as AKI. PMT has been consulted to assist with goals of care conversation.  Assessment: Goals of care conversation AKI Pneumonia Acute respiratory failure CHF Major depressive disorder  Recommendations/Plan: Continue full code/full scope treatment for now, ongoing discussions Psychosocial emotional support provided At this time patient feels like he would consider dialysis as a last resort Tentative family meeting with patient and his brother on Saturday 9/2 PMT will continue to follow and support   Prognosis: Poor  Discharge Planning: To Be Determined  Care plan was discussed with patient   MDM high         Curran Lenderman Johnnette Litter, PA-C  Palliative Medicine Team Team phone # [phone]  Thank you for allowing the Palliative Medicine Team to assist in the care of this patient. Please utilize secure chat with additional questions, if there is no response within 30 minutes please call the above phone number.  Palliative Medicine Team  providers are available by phone from 7am to 7pm daily and can be reached through the team cell phone.  Should this patient require assistance outside of these hours, please call the patient's attending physician.

## 2021-10-25 NOTE — Progress Notes (Signed)
I have been peripherally following patient's progress and agree with continued diuresis, as much as tolerated by kidneys. No other recommendations from cardiac standpoint at this time.   Nigel Mormon, MD Pager: [phone] Office: [phone]

## 2021-10-26 DIAGNOSIS — I10 Essential (primary) hypertension: Secondary | ICD-10-CM | POA: Diagnosis not present

## 2021-10-26 DIAGNOSIS — J45991 Cough variant asthma: Secondary | ICD-10-CM | POA: Diagnosis not present

## 2021-10-26 DIAGNOSIS — E1165 Type 2 diabetes mellitus with hyperglycemia: Secondary | ICD-10-CM | POA: Diagnosis not present

## 2021-10-26 DIAGNOSIS — N179 Acute kidney failure, unspecified: Secondary | ICD-10-CM | POA: Diagnosis not present

## 2021-10-26 DIAGNOSIS — E782 Mixed hyperlipidemia: Secondary | ICD-10-CM | POA: Diagnosis not present

## 2021-10-26 LAB — RENAL FUNCTION PANEL
Albumin: 2.1 g/dL — ABNORMAL LOW (ref 3.5–5.0)
Anion gap: 8 (ref 5–15)
BUN: 48 mg/dL — ABNORMAL HIGH (ref 6–20)
CO2: 18 mmol/L — ABNORMAL LOW (ref 22–32)
Calcium: 7.8 mg/dL — ABNORMAL LOW (ref 8.9–10.3)
Chloride: 112 mmol/L — ABNORMAL HIGH (ref 98–111)
Creatinine, Ser: 3.79 mg/dL — ABNORMAL HIGH (ref 0.61–1.24)
GFR, Estimated: 17 mL/min — ABNORMAL LOW (ref >60)
Glucose, Bld: 136 mg/dL — ABNORMAL HIGH (ref 70–99)
Phosphorus: 5.9 mg/dL — ABNORMAL HIGH (ref 2.5–4.6)
Potassium: 3.5 mmol/L (ref 3.5–5.1)
Sodium: 138 mmol/L (ref 135–145)

## 2021-10-26 LAB — CBC
HCT: 25.7 % — ABNORMAL LOW (ref 39.0–52.0)
Hemoglobin: 8.2 g/dL — ABNORMAL LOW (ref 13.0–17.0)
MCH: 25.4 pg — ABNORMAL LOW (ref 26.0–34.0)
MCHC: 31.9 g/dL (ref 30.0–36.0)
MCV: 79.6 fL — ABNORMAL LOW (ref 80.0–100.0)
Platelets: 240 10*3/uL (ref 150–400)
RBC: 3.23 MIL/uL — ABNORMAL LOW (ref 4.22–5.81)
RDW: 16.9 % — ABNORMAL HIGH (ref 11.5–15.5)
WBC: 4.2 10*3/uL (ref 4.0–10.5)
nRBC: 0 % (ref 0.0–0.2)

## 2021-10-26 LAB — GLUCOSE, CAPILLARY
Glucose-Capillary: 109 mg/dL — ABNORMAL HIGH (ref 70–99)
Glucose-Capillary: 144 mg/dL — ABNORMAL HIGH (ref 70–99)
Glucose-Capillary: 152 mg/dL — ABNORMAL HIGH (ref 70–99)
Glucose-Capillary: 150 mg/dL — ABNORMAL HIGH (ref 70–99)
Glucose-Capillary: 223 mg/dL — ABNORMAL HIGH (ref 70–99)

## 2021-10-26 MED ORDER — RENA-VITE PO TABS
1.0000 | ORAL_TABLET | Freq: Every day | ORAL | Status: DC
  Administered 2021-10-26 – 2021-10-31 (×6): 1 via ORAL
  Filled 2021-10-26 (×6): qty 1

## 2021-10-26 MED ORDER — NEPRO/CARBSTEADY PO LIQD
237.0000 mL | Freq: Every day | ORAL | Status: DC
  Administered 2021-10-26 – 2021-10-31 (×4): 237 mL via ORAL

## 2021-10-26 MED ORDER — PANTOPRAZOLE SODIUM 40 MG PO TBEC
40.0000 mg | DELAYED_RELEASE_TABLET | Freq: Every day | ORAL | Status: DC
  Administered 2021-10-26 – 2021-10-31 (×6): 40 mg via ORAL
  Filled 2021-10-26 (×7): qty 1

## 2021-10-26 NOTE — Progress Notes (Signed)
[facility] NEPHROLOGY PROGRESS NOTE  Assessment/ Plan: Pt is a 61 y.o. yo male  with past medical history of hypertension, diabetes, TIA, DVT, peripheral vascular disease with left BKA, bipolar mood disorder, CHF who was admitted on 8/25 for shortness of breath and fluid overload seen as a consultation for the management of acute kidney injury.   #Acute kidney injury on CKD stage III: Multifactorial etiology including IV contrast on 8/25,CHF exacerbation/PNA and severe anemia and +BOO.  UA with chronic proteinuria without RBC. Bladder scan with acute urinary retention requiring intermittent catheterization.  Kidney ultrasound unremarkable. Initially treated with IV Lasix, metolazone and albumin with good response.  After the respiratory status improved with switch to oral Lasix 80 mg twice a day.  Continue to have decent urine output and it seems like serum creatinine level is now plateaued.  No acute need for dialysis, hopefully we can avoid it.  Seen by palliative care team, he would consider dialysis as a last resort and noted family meeting planned on 9/2. Daily lab, strict ins and out.   #Acute on chronic diastolic CHF:  Echo with grade 2 diastolic dysfunction.  Breathing is much better but he still has some peripheral edema which seems to be chronic.  Continue diuretics.  Cardiology is following.   #Metabolic acidosis: Continue sodium bicarbonate.  Monitor serum CO2 level.   #Acute hypoxic respiratory failure due to HCAP and CHF exacerbation: Completed antibiotics for pneumonia, diuretics as above. Respiratory status much improved and currently on room air.   #Iron deficiency anemia: Iron saturation is only 5%, receiving IV iron.  Received Aranesp on 8/29.   # HTN: Currently on amlodipine.  Diuretics as above to manage volume.  #Right upper extremity swelling, localized, ultrasound with no DVT.  No sign of infection.  Subjective: Seen and examined at bedside.  No new  event.  Denies nausea, vomiting, chest pain, shortness of breath. Vital signs in last 24 hours: Vitals:   10/25/21 1638 10/25/21 2111 10/26/21 0732 10/26/21 0930  BP: (!) 153/95 (!) 155/89  (!) 181/91  Pulse: 74 71  70  Resp: 18     Temp: 97.8 F (36.6 C) 98.5 F (36.9 C) 97.8 F (36.6 C)   TempSrc: Oral Oral Oral   SpO2: 100% 99%  100%  Weight:      Height:       Weight change:   Intake/Output Summary (Last 24 hours) at 10/26/2021 1148 Last data filed at 10/26/2021 0932 Gross per 24 hour  Intake 960 ml  Output 2926 ml  Net -1966 ml        Labs: RENAL PANEL Recent Labs    12/04/20 1317 12/05/20 0832 12/07/20 0254 03/03/21 0818 03/04/21 0730 03/05/21 0609 03/06/21 0353 03/07/21 0809 03/08/21 0115 03/09/21 0433 04/12/21 0551 10/19/21 1232 10/19/21 1840 10/20/21 0449 10/20/21 1259 10/21/21 0234 10/21/21 1501 10/22/21 0751 10/23/21 0947 10/24/21 1051 10/25/21 0451 10/26/21 0905  NA 133* 132*   < > 134* 132* 133* 134* 130* '136 140 140 140 139 140 140 139 136 139 '$ 138 138 139 138  K 4.1 4.6   < > 4.0 3.9 3.5 3.0* 3.1* 3.9 4.5 3.1* 5.8* 5.4* 4.9 5.0 4.6 4.7 4.2 3.8 4.1 3.8 3.5  CL 104 105   < > 107 105 100 98 91* 98 104 111 115* 116* 118* 117* 116* 113* 115* 113* 113* 113* 112*  CO2 23 22   < > 15* 14* 20* '22 28 27 28 '$ 19* 16* 17*  16* 16* 16* 15* 15* 16* 17* 18* 18*  GLUCOSE 209* 176*   < > 176* 145* 140* 142* 154* 158* 128* 121* 122* 116* 96 151* 124* 163* 121* 144* 149* 138* 136*  BUN 18 17   < > 95* 94* 88* 82* 74* 65* 53* 18 40* 42* 42* 43* 45* 44* 45* 48* 47* 47* 48*  CREATININE 1.31* 1.42*   < > 2.91* 2.85* 2.38* 1.93* 1.69* 1.46* 1.21 0.74 2.03* 1.97* 2.06* 2.20* 2.19* 2.42* 2.87* 3.37* 3.58* 3.74* 3.79*  CALCIUM 8.0* 7.8*   < > 8.2* 8.0* 7.7* 7.7* 7.4* 7.5* 7.8* 8.2* 8.9 8.4* 8.4* 8.3* 7.9* 7.8* 7.8* 7.9* 7.7* 7.7* 7.8*  MG 2.1 1.9   < > 2.2 2.0 1.8 1.9 1.8 1.7 1.9  --   --   --   --  1.9 1.9  --   --   --   --  1.9  --   PHOS 2.7 2.5  --  6.3* 6.1* 4.8*  4.6  --   --   --   --   --   --   --   --   --   --   --  5.9* 6.1* 6.0* 5.9*  ALBUMIN 2.6* 2.6*   < > 2.0* 2.0* 1.9* 1.9*  --   --   --  2.2* 2.8*  --  2.0*  --   --   --   --  2.3* 2.0* 2.0* 2.1*   < > = values in this interval not displayed.      Liver Function Tests: Recent Labs  Lab 10/19/21 1232 10/20/21 0449 10/23/21 0947 10/24/21 1051 10/25/21 0451 10/26/21 0905  AST 16 10*  --   --   --   --   ALT 20 15  --   --   --   --   ALKPHOS 126 91  --   --   --   --   BILITOT 0.8 0.5  --   --   --   --   PROT 7.5 6.0*  --   --   --   --   ALBUMIN 2.8* 2.0*   < > 2.0* 2.0* 2.1*   < > = values in this interval not displayed.    Recent Labs  Lab 10/19/21 1232  LIPASE 20    No results for input(s): "AMMONIA" in the last 168 hours. CBC: Recent Labs    03/02/21 1151 03/03/21 0818 10/20/21 0449 10/21/21 0234 10/22/21 0751 10/23/21 0947 10/24/21 1051 10/25/21 0451 10/26/21 0905  HGB 7.0*   < >  --    < > 7.6* 8.2* 7.5* 7.8* 8.2*  MCV  --    < >  --    < > 81.1 80.9 82.0 80.8 79.6*  VITAMINB12 471  --  439  --   --   --   --   --   --   FOLATE 7.2  --  6.9  --   --   --   --   --   --   FERRITIN 365*  --  164  --   --   --   --   --   --   TIBC 167*  --  227*  --   --   --   --   --   --   IRON 14*  --  12*  --   --   --   --   --   --  RETICCTPCT 1.1  --  2.6  --   --   --   --   --   --    < > = values in this interval not displayed.     Cardiac Enzymes: No results for input(s): "CKTOTAL", "CKMB", "CKMBINDEX", "TROPONINI" in the last 168 hours. CBG: Recent Labs  Lab 10/25/21 1153 10/25/21 1712 10/25/21 2131 10/26/21 0734 10/26/21 1101  GLUCAP 180* 157* 162* 109* 144*     Iron Studies: No results for input(s): "IRON", "TIBC", "TRANSFERRIN", "FERRITIN" in the last 72 hours. Studies/Results: VAS [place] UPPER EXTREMITY VENOUS DUPLEX  Result Date: 10/24/2021 UPPER VENOUS STUDY  Patient Name:  ATREYU MAK  Date of Exam:   10/24/2021 Medical Rec #: [id number]       Accession #:    [id number] Date of Birth: 1960/03/27       Patient Gender: M Patient Age:   52 years Exam Location:  [facility] Procedure:      VAS [place] UPPER EXTREMITY VENOUS DUPLEX Referring Phys: Andrena Mews --------------------------------------------------------------------------------  Indications: Swelling RT>LT, per patient- no history of DVT, patient acknowledges multiple prior negative examinations Comparison       08-22-2020 Prior upper extremity venous right was negative for Study:           DVT. Performing Technologist: Darlin Coco RDMS, RVT  Examination Guidelines: A complete evaluation includes B-mode imaging, spectral Doppler, color Doppler, and power Doppler as needed of all accessible portions of each vessel. Bilateral testing is considered an integral part of a complete examination. Limited examinations for reoccurring indications may be performed as noted.  Right Findings: +----------+------------+---------+-----------+----------+-------+ RIGHT     CompressiblePhasicitySpontaneousPropertiesSummary +----------+------------+---------+-----------+----------+-------+ IJV           Full       Yes       Yes                      +----------+------------+---------+-----------+----------+-------+ Subclavian    Full       Yes       Yes                      +----------+------------+---------+-----------+----------+-------+ Axillary      Full       Yes       Yes                      +----------+------------+---------+-----------+----------+-------+ Brachial      Full                                          +----------+------------+---------+-----------+----------+-------+ Radial        Full                                          +----------+------------+---------+-----------+----------+-------+ Ulnar         Full                                          +----------+------------+---------+-----------+----------+-------+ Cephalic      Full                                           +----------+------------+---------+-----------+----------+-------+  Basilic       Full                                          +----------+------------+---------+-----------+----------+-------+  Left Findings: +----------+------------+---------+-----------+----------+-------+ LEFT      CompressiblePhasicitySpontaneousPropertiesSummary +----------+------------+---------+-----------+----------+-------+ IJV           Full       Yes       Yes                      +----------+------------+---------+-----------+----------+-------+ Subclavian    Full       Yes       Yes                      +----------+------------+---------+-----------+----------+-------+ Axillary      Full                                          +----------+------------+---------+-----------+----------+-------+ Brachial      Full                                          +----------+------------+---------+-----------+----------+-------+ Radial        Full                                          +----------+------------+---------+-----------+----------+-------+ Ulnar         Full                                          +----------+------------+---------+-----------+----------+-------+ Cephalic      Full                                          +----------+------------+---------+-----------+----------+-------+ Basilic       Full                                          +----------+------------+---------+-----------+----------+-------+  Summary:  Right: No evidence of deep vein thrombosis in the upper extremity. No evidence of superficial vein thrombosis in the upper extremity. Significant subcutaneous edema noted throughout the right upper extremity.  Left: No evidence of deep vein thrombosis in the upper extremity. No evidence of superficial vein thrombosis in the upper extremity.  *See table(s) above for measurements and observations.   Diagnosing physician: Jamelle Haring Electronically signed by Jamelle Haring on 10/24/2021 at 4:54:12 PM.    Final     Medications: Infusions:  ferric gluconate (FERRLECIT) IVPB 250 mg (10/26/21 0932)    Scheduled Medications:  amLODipine  10 mg Oral Daily   ascorbic acid  500 mg Oral BID   aspirin  81 mg Oral Daily   atorvastatin  80 mg Oral QHS   brimonidine  1 drop Both Eyes BID   calcium acetate  667 mg Oral TID WC   Chlorhexidine Gluconate Cloth  6 each Topical Daily   dorzolamide-timolol  1 drop Both Eyes BID   furosemide  80 mg Oral BID   heparin injection (subcutaneous)  5,000 Units Subcutaneous Q8H   latanoprost  1 drop Both Eyes QHS   pantoprazole  40 mg Oral Daily   pregabalin  75 mg Oral Daily   sertraline  50 mg Oral QHS   sodium bicarbonate  1,300 mg Oral BID   tamsulosin  0.8 mg Oral Daily    have reviewed scheduled and prn medications.  Physical Exam: General:NAD, comfortable Heart:RRR, s1s2 nl, no rubs Lungs: Clear bilateral, no increased work of breathing. Abdomen:soft, Non-tender, non-distended Extremities: Left BKA, peripheral edema present+,  Looks chronic Neurology: Alert, awake, nonfocal and no asterixis  Julyanna Scholle Prasad Nicolo Tomko 10/26/2021,11:48 AM  LOS: 7 days

## 2021-10-26 NOTE — Progress Notes (Addendum)
Daily Progress Note Intern Pager: [phone]  Patient name: [name] Medical record number: [id number] Date of birth: 09-Sep-1960 Age: 61 y.o. Gender: male  Primary Care Provider: Jodi Marble, MD Consultants: Cardiology, Nephrology, Palliative, Nutrition Code Status: FULL  Pt Overview and Major Events to Date:  8/25 - admitted   Assessment and Plan: [name] is a 61 y.o. 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 We are monitoring his fluid status while diuresing, treating pneumonia, managing AKI, and consulting palliative for [name] discussion given baseline disability and multiple acute processes. Plan for family meeting with palliative on 10/27/21.   * AKI (acute kidney injury) ([place]) Acute on chronic kidney disease IIIa. Baseline last recorded Cr 0.74 and GFR >60 in Feb 2023. Most recent Creatinine 3.79 and GFR 17. Per renal, multifactorial etiology including IV contrast on admission, CHF exacerbation/PNA (prerenal) and severe anemia. Renal ultrasound showed no acute abnormality. Most recent output 0.9 mL/kg/hr. Completed IV albumin per nephro. Renal function panel revealed elevated phosphorous 5.9 and low albumin of 2.1. - Nephrology following, appreciate care and recommendations  - Continue Lasix to 80 mg PO BID - Continue phosphate binder, Phoslo 667 mg  - Nutrition consult due to low albumin, appreciate recs   - Continue to trend daily BMP  - Avoid nephrotoxic agents - Strict I/Os and daily weights  - Foley in place; consider d/c when appropriate  - Consider HD if no improvement in renal function  - Placed palliative consult, plan for family meeting to discuss [place] 9/2  Pneumonia Originally septic with fever. Blood cultures NG5D. Completed Azithro 3 day course and  CTX 7 day course for PNA which was confirmed with imaging findings, fever, and sputum production. - Flutter valve and incentive spirometry  Iron deficiency anemia Hx of microcytic anemia. Iron and TIBC panel on 8/26 revealed low TIBC and 5% saturation ratio, pointing to an IDA picture. Hb 8.7 and Hct 30 at baseline, 6.4 on morning of 10/21/21. 1 unit PRBC given and post transfusion h/h 7.4 and 23.7. Per nephro, given 60 mcg injection of Aranesp.  - Continue IV iron x 4 doses - Continue 40 mg Protonix  - Nutrition consult placed  - Trend daily H&H (most recently at baseline Hb 8.2)  - Threshold for transfusion <7 - Consider workup for chronic GI bleed if he requires another transfusion   Swelling of arm Right sided weakness from prior CVA. Right arm swollen at baseline due to immobility and fluid overload. Currently no redness or streaking noted. US showed no DVT.  - Monitor right arm circumference: 15 inches  Metabolic acidosis CO2 18 today. Most likely secondary to acute PNA on chronic CHF with exacerbation in addition to CKD stage IIIa.  - Continue sodium bicarbonate tablet 1,300 mg PO BID  (HFpEF) heart failure with preserved ejection fraction ([place]) Echo 10/21/21 revealed EF of 60-65% and Grade 2 diastolic dysfunction with moderate pulmonary hypertension, moderate aortic stenosis, and small pericardial effusion. BNP 547 elevated from baseline. - Strict I/Os and daily weights  - Diurese per nephrology   Type 2 diabetes mellitus (HCC) Chronic problem. On sliding scale lispro at home. Glucose range 120-180s. Hb A1c 5.8. - CBG checks AC HS - Consider starting sliding scale insulin if sugars persistently >180  -  Encourage nutrition with diabetic diet   Multiple thyroid nodules Chest CT angio revealed "multiple thyroid nodules measuring up to 2.1 cm in maximum diameter each, with progression.  -TSH 1.541 WNL  -Recommend follow up outpatient for thyroid ultrasound on d/c  Pressure  injury of skin Sacral ulcer present per SNF documentation. Unable to visualize due to inability to turn patient without multiple staff assistance. - Wound care nurse consulted, appreciate care and recs   Chronic problems: Amlodipine for HTN, atorvastatin for PAD, Flomax for BPH, Lyrica for diabetic neuropathy, Zoloft for MDD.  FEN/GI: Regular  PPx: Heparin 5,000 units q8h Dispo:SNF pending clinical improvement . Barriers include diuresing for fluid overload, worsening AKI, family meeting with palliative to discuss [place].  Subjective:  Patient was seen at bedside resting comfortably. I adjusted his left leg   Objective: Temp:  [97.8 F (36.6 C)-98.6 F (37 C)] 97.8 F (36.6 C) (09/01 0732) Pulse Rate:  [70-76] 70 (09/01 0930) Resp:  [18-19] 18 (08/31 1638) BP: (153-181)/(89-96) 181/91 (09/01 0930) SpO2:  [99 %-100 %] 100 % (09/01 0930)  Physical Exam: General: Alert and oriented x 4, right sided hemiplegia, pleasant and conversational Cardiovascular: RRR S1 S2 present, murmur 3/6 systolic likely from moderate aortic stenosis, fluid overloaded with 2+ pitting edema in right arm and lower extremities Respiratory: Mild rales, mostly clear to ascultation bilaterally. Slightly increased WOB. Not producing sputum.  Abdomen: Soft, not tender to palpation. Extremities: Left BKA, R arm with 2+ pitting edema with no streaking or erythema.     Laboratory: Most recent CBC Lab Results  Component Value Date   WBC 4.2 10/26/2021   HGB 8.2 (L) 10/26/2021   HCT 25.7 (L) 10/26/2021   MCV 79.6 (L) 10/26/2021   PLT 240 10/26/2021   Most recent BMP    Latest Ref Rng & Units 10/26/2021    9:05 AM  BMP  Glucose 70 - 99 mg/dL 136   BUN 6 - 20 mg/dL 48   Creatinine 0.61 - 1.24 mg/dL 3.79   Sodium 135 - 145 mmol/L 138   Potassium 3.5 - 5.1 mmol/L 3.5   Chloride 98 - 111 mmol/L 112   CO2 22 - 32 mmol/L 18   Calcium 8.9 - 10.3 mg/dL 7.8    None new.   Lupita Raider, Medical  Student 10/26/2021, 2:06 PM [place] Intern pager: [phone], text pages welcome Secure chat group [place] Upper-Level Resident Addendum   I have independently interviewed and examined the patient. I have discussed the above with the original author and agree with their documentation. My edits for correction/addition/clarification are in within the document. Please see also any attending notes.   Rise Patience, DO  PGY-3, [facility] 10/26/2021 2:22 PM  FPTS Service pager: [phone] (text pages welcome through [facility])

## 2021-10-26 NOTE — Progress Notes (Signed)
Initial Nutrition Assessment  DOCUMENTATION CODES:   Obesity unspecified  INTERVENTION:   Nepro Shake po daily, each supplement provides 425 kcal and 19 grams protein  Renal MVI daily   NUTRITION DIAGNOSIS:   Inadequate oral intake related to acute illness as evidenced by per patient/family report.  GOAL:   Patient will meet greater than or equal to 90% of their needs  MONITOR:   PO intake, Supplement acceptance, Labs, Weight trends  REASON FOR ASSESSMENT:   Consult Assessment of nutrition requirement/status  ASSESSMENT:   61 yo male admitted with AKI on CKD 3, acute on chronic CHF with acute respiratory failure. PMH includes HTN, DM, PVD with L BKA, CHF, bipolar disorder  Palliative following for GOC. Noted pt would consider dialysis as a last resort, no acute indication for iHD per Nephrology.   Recorded po intake 0-100% of meals on Regular diet  Pt with healed PI site seen by [facility] RN. High risk for re-breakdown  Pt with moderate edema present. Current wt 127.7 kg weight has trended up since admission, admit wt 119.2 kg.  Pt started on Phosphorus binder; follow phosphorus trend, po intake.   Labs: phosphorus 5.8 (H), CBGs 109-180 Meds: Vitamin C, PhosLo with meals, ferric gluconate, lasix, sodium bicarb    Diet Order:   Diet Order             Diet regular Room service appropriate? Yes; Fluid consistency: Thin  Diet effective now                   EDUCATION NEEDS:   Education needs have been addressed  Skin:  Skin Assessment: Reviewed RN Assessment Skin Integrity Issues:: Other (Comment)  Last BM:  8/30  Height:   Ht Readings from Last 1 Encounters:  10/24/21 '5\' 8"'$  (1.727 m)    Weight:   Wt Readings from Last 1 Encounters:  10/25/21 127.7 kg    BMI:  Body mass index is 42.81 kg/m.  Estimated Nutritional Needs:   Kcal:  2000-2200 kcals  Protein:  90-110 g  Fluid:  >/= 2L    Kerman Passey MS, RDN, LDN, CNSC Registered  Dietitian 3 Clinical Nutrition RD Pager and On-Call Pager Number Located in [place]

## 2021-10-27 DIAGNOSIS — E1165 Type 2 diabetes mellitus with hyperglycemia: Secondary | ICD-10-CM | POA: Diagnosis not present

## 2021-10-27 DIAGNOSIS — J189 Pneumonia, unspecified organism: Secondary | ICD-10-CM | POA: Diagnosis not present

## 2021-10-27 DIAGNOSIS — E782 Mixed hyperlipidemia: Secondary | ICD-10-CM | POA: Diagnosis not present

## 2021-10-27 DIAGNOSIS — M7989 Other specified soft tissue disorders: Secondary | ICD-10-CM

## 2021-10-27 DIAGNOSIS — I10 Essential (primary) hypertension: Secondary | ICD-10-CM | POA: Diagnosis not present

## 2021-10-27 DIAGNOSIS — F332 Major depressive disorder, recurrent severe without psychotic features: Secondary | ICD-10-CM | POA: Diagnosis not present

## 2021-10-27 DIAGNOSIS — E042 Nontoxic multinodular goiter: Secondary | ICD-10-CM

## 2021-10-27 DIAGNOSIS — Z515 Encounter for palliative care: Secondary | ICD-10-CM

## 2021-10-27 DIAGNOSIS — Z7189 Other specified counseling: Secondary | ICD-10-CM

## 2021-10-27 DIAGNOSIS — Z789 Other specified health status: Secondary | ICD-10-CM

## 2021-10-27 DIAGNOSIS — J45991 Cough variant asthma: Secondary | ICD-10-CM | POA: Diagnosis not present

## 2021-10-27 DIAGNOSIS — N179 Acute kidney failure, unspecified: Secondary | ICD-10-CM | POA: Diagnosis not present

## 2021-10-27 DIAGNOSIS — I5033 Acute on chronic diastolic (congestive) heart failure: Secondary | ICD-10-CM | POA: Diagnosis not present

## 2021-10-27 LAB — CBC
HCT: 25.3 % — ABNORMAL LOW (ref 39.0–52.0)
Hemoglobin: 7.9 g/dL — ABNORMAL LOW (ref 13.0–17.0)
MCH: 25.1 pg — ABNORMAL LOW (ref 26.0–34.0)
MCHC: 31.2 g/dL (ref 30.0–36.0)
MCV: 80.3 fL (ref 80.0–100.0)
Platelets: 229 10*3/uL (ref 150–400)
RBC: 3.15 MIL/uL — ABNORMAL LOW (ref 4.22–5.81)
RDW: 17.2 % — ABNORMAL HIGH (ref 11.5–15.5)
WBC: 4.9 10*3/uL (ref 4.0–10.5)
nRBC: 0 % (ref 0.0–0.2)

## 2021-10-27 LAB — RENAL FUNCTION PANEL
Albumin: 2 g/dL — ABNORMAL LOW (ref 3.5–5.0)
Anion gap: 6 (ref 5–15)
BUN: 49 mg/dL — ABNORMAL HIGH (ref 6–20)
CO2: 21 mmol/L — ABNORMAL LOW (ref 22–32)
Calcium: 7.9 mg/dL — ABNORMAL LOW (ref 8.9–10.3)
Chloride: 112 mmol/L — ABNORMAL HIGH (ref 98–111)
Creatinine, Ser: 3.69 mg/dL — ABNORMAL HIGH (ref 0.61–1.24)
GFR, Estimated: 18 mL/min — ABNORMAL LOW (ref >60)
Glucose, Bld: 130 mg/dL — ABNORMAL HIGH (ref 70–99)
Phosphorus: 5.8 mg/dL — ABNORMAL HIGH (ref 2.5–4.6)
Potassium: 3.5 mmol/L (ref 3.5–5.1)
Sodium: 139 mmol/L (ref 135–145)

## 2021-10-27 LAB — GLUCOSE, CAPILLARY
Glucose-Capillary: 131 mg/dL — ABNORMAL HIGH (ref 70–99)
Glucose-Capillary: 172 mg/dL — ABNORMAL HIGH (ref 70–99)
Glucose-Capillary: 241 mg/dL — ABNORMAL HIGH (ref 70–99)
Glucose-Capillary: 160 mg/dL — ABNORMAL HIGH (ref 70–99)

## 2021-10-27 NOTE — Progress Notes (Signed)
Daily Progress Note   Patient Name: [name]       Date: 10/27/2021 DOB: May 16, 1960  Age: 61 y.o. MRN#: [id number] Attending Physician: Leeanne Rio, MD Primary Care Physician: Jodi Marble, MD Admit Date: 10/19/2021  Reason for Consultation/Follow-up: Establishing goals of care  Subjective: 8:30 AM Called patient's brother/[name] to schedule family meeting - he requests today at 3p.  3:00 PM Chart review performed. Received report from primary RN - no acute concerns. Reports patient has been drowsy today but with good appetite and oral intake.   Went to visit patient and family at bedside for scheduled family meeting. Patient was lying in bed asleep - no family present.   3:23 PM No family present for meeting. Called brother/[name] to confirm meeting for today vs rescheduling - he states he and family are walking into the hospital now.  3:40 PM Family arrive to unit - patient is receiving a bath - unfortunately, unable to meet in patient's room with patient for family meeting.  Met with patient's brother/[name], SIL/[name], father/[name], sister/[name]. Patient's interval history since admission was reviewed in detail. Education provided CKD and CHF are non-curable, progressive diseases underlying patient's current acute medical situation. Reviewed concern of his increasing creatinine and provided updates this lab did show slight decrease this morning. Reviewed, pending labs and clinical course, patient may be faced with hard decisions around initiating dialysis; though we are hopeful to try and avoid this at this time. Reviewed previous PMT conversations with patient about this topic - family express understanding. Education provided on hemodialysis procedure including anticipated side  effects. [name] received dialysis for several years before his kidney transplant - allowed space and time for him to review how difficult receiving hemodialysis is. Family would understand and support patient if he decided he would not want to pursue dialysis, but would also support him if he would like to try. Education provided on delicate balance between managing CKD in context of CHF as well as chronic illnesses with patient's overall frailty.  We did discuss that with the patient's chronic, progressive diseases, we as the medical team can see a time coming down the road where the patient's diseases will reach end stages and comfort care would be appropriate at that time; discussed how it is important to prepare for that time. Family expressed  understanding.   Family understand patient is at high risk for rehospitalization and may be faced with hard decisions in the future regarding dialysis vs comfort care/hospice. Hospice philosophy reviewed. Family are supportive of patient and his wishes. Encouraged them to continue [place] conversations with patient to ensure decisions are made with his goals and quality of life in mind.     Family express appreciation for meeting today.  All questions and concerns addressed. Encouraged to call with questions and/or concerns. PMT card provided.  Length of Stay: 8  Current Medications: Scheduled Meds:   amLODipine  10 mg Oral Daily   ascorbic acid  500 mg Oral BID   aspirin  81 mg Oral Daily   atorvastatin  80 mg Oral QHS   brimonidine  1 drop Both Eyes BID   calcium acetate  667 mg Oral TID WC   Chlorhexidine Gluconate Cloth  6 each Topical Daily   dorzolamide-timolol  1 drop Both Eyes BID   feeding supplement (NEPRO CARB STEADY)  237 mL Oral QHS   furosemide  80 mg Oral BID   heparin injection (subcutaneous)  5,000 Units Subcutaneous Q8H   latanoprost  1 drop Both Eyes QHS   multivitamin  1 tablet Oral QHS   pantoprazole  40 mg Oral Daily   pregabalin   75 mg Oral Daily   sertraline  50 mg Oral QHS   sodium bicarbonate  1,300 mg Oral BID   tamsulosin  0.8 mg Oral Daily    Continuous Infusions:  ferric gluconate (FERRLECIT) IVPB 250 mg (10/27/21 0928)    PRN Meds:   Physical Exam Vitals and nursing note reviewed.  Constitutional:      General: He is not in acute distress. Pulmonary:     Effort: No respiratory distress.  Skin:    General: Skin is warm and dry.  Neurological:     Motor: Weakness present.             Vital Signs: BP (!) 147/85 (BP Location: Left Arm)   Pulse 77   Temp 97.7 F (36.5 C) (Oral)   Resp 14   Ht 5' 8"  (1.727 m)   Wt 122.7 kg   SpO2 100%   BMI 41.13 kg/m  SpO2: SpO2: 100 % O2 Device: O2 Device: Room Air O2 Flow Rate: O2 Flow Rate (L/min): 2 L/min  Intake/output summary:  Intake/Output Summary (Last 24 hours) at 10/27/2021 1501 Last data filed at 10/27/2021 1457 Gross per 24 hour  Intake 597 ml  Output 3975 ml  Net -3378 ml   LBM: Last BM Date : 10/26/21 Baseline Weight: Weight: 119.2 kg Most recent weight: Weight: 122.7 kg       Palliative Assessment/Data: PPS 40%      Patient Active Problem List   Diagnosis Date Noted   Swelling of arm 76/14/7092   Metabolic acidosis 95/74/7340   Pneumonia 10/20/2021   Community acquired pneumonia    Multiple thyroid nodules 10/19/2021   (HFpEF) heart failure with preserved ejection fraction (HCC) 10/19/2021   Diabetic foot infection ([place]) 03/01/2021   PAD (peripheral artery disease) ([place]) 03/01/2021   Glaucoma (increased eye pressure) 03/01/2021   Pressure injury of skin 11/19/2020   Ileus ([place]) 11/18/2020   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   Depression 11/18/2020   Acute renal failure superimposed on stage 3a chronic kidney disease ([place]) 11/18/2020   BPH (benign prostatic hyperplasia) 11/18/2020   AKI (acute kidney injury) ([place]) 08/22/2020   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  08/22/2020   Pericardial effusion 08/22/2020   Acute  on chronic diastolic CHF (congestive heart failure) ([place]) 08/22/2020   TIA (transient ischemic attack) 07/03/2020   Acute on chronic heart failure with preserved ejection fraction ([place])    Pulmonary artery hypertension ([place]) 06/25/2020   History of cerebrovascular accident (CVA) with residual deficit 06/25/2020   CKD stage IIIa ([place]) 03/30/2020   Morbid (severe) obesity due to excess calories ([place]) 03/30/2020   Malnutrition of moderate degree 01/25/2020   Palliative care by specialist    Goals of care, counseling/discussion    Nephrotic syndrome 01/06/2020   Anasarca 01/06/2020   Hypokalemia due to loss of potassium 01/04/2020   Generalized body aches 01/01/2020   Non-traumatic rhabdomyolysis 01/01/2020   Chronic diastolic CHF (congestive heart failure) ([place]) 01/01/2020   Iron deficiency anemia 01/01/2020   Hypokalemia 12/31/2019   Elevated CK    Uncontrolled type 2 diabetes mellitus with hyperglycemia ([place])    Pure hypercholesterolemia    Hyperglycemia 09/28/2017   Nonobstructive atherosclerosis of coronary artery 09/28/2017   MDD (major depressive disorder), recurrent episode, severe ([place]) 07/30/2017   Osteoarthritis 04/12/2015   Severe recurrent major depression with psychotic features ([place]) 01/04/2015   Substance-related disorder ([place]) 10/30/2014   Patient's noncompliance with other medical treatment and regimen 06/07/2014   Cocaine dependence, uncomplicated ([place]) 63/02/6008   Chronic pain 05/31/2014   Cocaine abuse ([place]) 05/31/2014   History of noncompliance with medical treatment 05/31/2014   Malingering 05/31/2014   Leukopenia 05/31/2014   Diabetic neuropathy associated with diabetes mellitus due to underlying condition ([place]) 05/31/2014   Diabetic neuropathy ([place]) 05/31/2014   Depression, major, recurrent, severe with psychosis ([place]) 08/29/2013   Suicidal ideations 08/28/2013   Type 2 diabetes mellitus ([place]) 06/30/2013   Atypical chest pain 12/09/2011   Hypertension  12/09/2011   Episodic mood disorder ([place]) 05/13/2011    Palliative Care Assessment & Plan   Patient Profile: 61 y.o. male  with past medical history of  CHF, Bipolar, MDD, DM2, HTN, TIA, hx of DVT, CKD3, and left BKA admitted on 10/19/2021 with fever, chills, worsening dyspnea.    Patient recently admitted for PNA. Now admitted for acute hypoxic respiratory failure due to PNA and CHF exacerbation, as well as AKI. PMT has been consulted to assist with goals of care conversation.  Assessment: Principal Problem:   AKI (acute kidney injury) ([place]) Active Problems:   Hypertension   Type 2 diabetes mellitus ([place])   MDD (major depressive disorder), recurrent episode, severe (HCC)   Diabetic neuropathy (HCC)   Iron deficiency anemia   BPH (benign prostatic hyperplasia)   Pressure injury of skin   PAD (peripheral artery disease) (HCC)   Multiple thyroid nodules   (HFpEF) heart failure with preserved ejection fraction (HCC)   Pneumonia   Community acquired pneumonia   Metabolic acidosis   Swelling of arm   Recommendations/Plan: Continue full code/full scope as previously documented Ongoing GOC pending patient's clinical course PMT will continue to follow and support holistically  Goals of Care and Additional Recommendations: Limitations on Scope of Treatment: Full Scope Treatment  Code Status:    Code Status Orders  (From admission, onward)           Start     Ordered   10/19/21 1555  Full code  Continuous        10/19/21 1602           Code Status History     Date Active Date Inactive Code Status Order  ID Comments User Context   03/01/2021 1507 03/13/2021 0408 Full Code [id number]  Karmen Bongo, MD Inpatient   11/18/2020 2041 12/12/2020 0309 Full Code [id number]  Orene Desanctis, DO ED   08/22/2020 0531 09/22/2020 1954 Full Code [id number]  Etta Quill, DO ED   06/30/2020 1243 07/05/2020 2345 Full Code [id number]  Geradine Girt, DO ED   06/25/2020 0944 06/28/2020 2024 Full  Code [id number]  Norval Morton, MD ED   01/17/2020 1708 01/31/2020 1753 DNR [id number]  Rosezella Rumpf, NP Inpatient   12/31/2019 2311 01/17/2020 1708 Full Code [id number]  [name], MD ED   12/31/2017 1739 01/02/2018 1315 Full Code [id number]  [name], [name], MD Inpatient   09/28/2017 1434 10/01/2017 1948 Full Code [id number]  [name], [place], DO ED   07/30/2017 2111 08/04/2017 1746 Full Code [id number]  Rankin, Keener B, NP Inpatient   07/29/2017 2347 07/30/2017 2033 Full Code [id number]  Recardo Evangelist, PA-C ED   01/08/2015 2013 01/09/2015 2017 Full Code [id number]  [name], MD Inpatient   01/04/2015 1915 01/08/2015 2013 Full Code [id number]  [name], MD Inpatient   12/01/2014 1735 12/02/2014 1242 Full Code [id number]  [name], MD ED   08/28/2013 1748 08/31/2013 1627 Full Code [id number]  [name], NP Inpatient   08/28/2013 1301 08/28/2013 1748 Full Code [id number]  [name], MD ED   05/10/2011 2141 05/12/2011 1747 Full Code [id number]  [name], PA-C ED       Prognosis:  Unable to determine  Discharge Planning: [facility] for rehab with Palliative care service follow-up  Care plan was discussed with primary RN, patient's family  Thank you for allowing the Palliative Medicine Team to assist in the care of this patient.   Total Time 60 minutes Prolonged Time Billed  no       Greater than 50%  of this time was spent counseling and coordinating care related to the above assessment and plan.  Lin Landsman, NP  Please contact Palliative Medicine Team phone at [phone] for questions and concerns.   *Portions of this note are a verbal dictation therefore any spelling and/or grammatical errors are due to the "[place] [employer]" system interpretation.

## 2021-10-27 NOTE — Progress Notes (Signed)
[name] NEPHROLOGY PROGRESS NOTE  Assessment/ Plan: Pt is a 61 y.o. yo male  with past medical history of hypertension, diabetes, TIA, DVT, peripheral vascular disease with left BKA, bipolar mood disorder, CHF who was admitted on 8/25 for shortness of breath and fluid overload seen as a consultation for the management of acute kidney injury.   #Acute kidney injury on CKD stage III: Multifactorial etiology including IV contrast on 8/25,CHF exacerbation/PNA and severe anemia and +BOO.  UA with chronic proteinuria without RBC. Bladder scan with acute urinary retention requiring intermittent catheterization.  Kidney ultrasound unremarkable. Initially treated with IV Lasix, metolazone and albumin with good response.  After the respiratory status improved with switch to oral Lasix 80 mg twice a day.  Continue to have good urine output and it seems like serum creatinine level is improving.  No acute need for dialysis, hopefully we can avoid it.  Seen by palliative care team, he would consider dialysis as a last resort and noted family meeting planned for today. Daily lab, strict ins and out.   #Acute on chronic diastolic CHF:  Echo with grade 2 diastolic dysfunction.  Breathing is much better but he still has some peripheral edema which seems to be chronic.  Continue diuretics.  Cardiology is following.   #Metabolic acidosis: Continue sodium bicarbonate.  Monitor serum CO2 level.   #Acute hypoxic respiratory failure due to HCAP and CHF exacerbation: Completed antibiotics for pneumonia, diuretics as above. Respiratory status much improved and currently on room air.   #Iron deficiency anemia: Iron saturation is only 5%, receiving IV iron.  Received Aranesp on 8/29.   # HTN: Currently on amlodipine.  Diuretics as above to manage volume.  #Right upper extremity swelling, localized, ultrasound with no DVT.  No sign of infection.  Subjective: Seen and examined at bedside.  Urine output  of around 5.3 L documented.  He denies nausea, vomiting, chest pain, shortness of breath.  No new event.  Vital signs in last 24 hours: Vitals:   10/26/21 2157 10/27/21 0500 10/27/21 0631 10/27/21 0852  BP: (!) 143/89  135/81 (!) 147/85  Pulse: 79  76 77  Resp: '16  17 14  '$ Temp: 98.4 F (36.9 C)  98.8 F (37.1 C) 97.7 F (36.5 C)  TempSrc:   Oral Oral  SpO2: 98%  96% 100%  Weight:  122.7 kg    Height:       Weight change:   Intake/Output Summary (Last 24 hours) at 10/27/2021 1033 Last data filed at 10/27/2021 9509 Gross per 24 hour  Intake 1375.44 ml  Output 3650 ml  Net -2274.56 ml        Labs: RENAL PANEL Recent Labs    12/05/20 3267 12/07/20 0254 03/03/21 0818 03/04/21 0730 03/05/21 0609 03/06/21 0353 03/07/21 0809 03/08/21 0115 03/09/21 0433 04/12/21 0551 10/19/21 1232 10/19/21 1840 10/20/21 0449 10/20/21 1259 10/21/21 0234 10/21/21 1501 10/22/21 0751 10/23/21 0947 10/24/21 1051 10/25/21 0451 10/26/21 0905 10/27/21 0819  NA 132*   < > 134* 132* 133* 134* 130* 136 140 140 140   < > '140 140 139 136 139 138 138 139 138 139 '$  K 4.6   < > 4.0 3.9 3.5 3.0* 3.1* 3.9 4.5 3.1* 5.8*   < > 4.9 5.0 4.6 4.7 4.2 3.8 4.1 3.8 3.5 3.5  CL 105   < > 107 105 100 98 91* 98 104 111 115*   < > 118* 117* 116* 113* 115* 113* 113* 113* 112* 112*  CO2 22   < > 15* 14* 20* '22 28 27 28 '$ 19* 16*   < > 16* 16* 16* 15* 15* 16* 17* 18* 18* 21*  GLUCOSE 176*   < > 176* 145* 140* 142* 154* 158* 128* 121* 122*   < > 96 151* 124* 163* 121* 144* 149* 138* 136* 130*  BUN 17   < > 95* 94* 88* 82* 74* 65* 53* 18 40*   < > 42* 43* 45* 44* 45* 48* 47* 47* 48* 49*  CREATININE 1.42*   < > 2.91* 2.85* 2.38* 1.93* 1.69* 1.46* 1.21 0.74 2.03*   < > 2.06* 2.20* 2.19* 2.42* 2.87* 3.37* 3.58* 3.74* 3.79* 3.69*  CALCIUM 7.8*   < > 8.2* 8.0* 7.7* 7.7* 7.4* 7.5* 7.8* 8.2* 8.9   < > 8.4* 8.3* 7.9* 7.8* 7.8* 7.9* 7.7* 7.7* 7.8* 7.9*  MG 1.9   < > 2.2 2.0 1.8 1.9 1.8 1.7 1.9  --   --   --   --  1.9 1.9  --    --   --   --  1.9  --   --   PHOS 2.5  --  6.3* 6.1* 4.8* 4.6  --   --   --   --   --   --   --   --   --   --   --  5.9* 6.1* 6.0* 5.9* 5.8*  ALBUMIN 2.6*   < > 2.0* 2.0* 1.9* 1.9*  --   --   --  2.2* 2.8*  --  2.0*  --   --   --   --  2.3* 2.0* 2.0* 2.1* 2.0*   < > = values in this interval not displayed.      Liver Function Tests: Recent Labs  Lab 10/25/21 0451 10/26/21 0905 10/27/21 0819  ALBUMIN 2.0* 2.1* 2.0*    No results for input(s): "LIPASE", "AMYLASE" in the last 168 hours.  No results for input(s): "AMMONIA" in the last 168 hours. CBC: Recent Labs    03/02/21 1151 03/03/21 0818 10/20/21 0449 10/21/21 0234 10/23/21 0947 10/24/21 1051 10/25/21 0451 10/26/21 0905 10/27/21 0819  HGB 7.0*   < >  --    < > 8.2* 7.5* 7.8* 8.2* 7.9*  MCV  --    < >  --    < > 80.9 82.0 80.8 79.6* 80.3  VITAMINB12 471  --  439  --   --   --   --   --   --   FOLATE 7.2  --  6.9  --   --   --   --   --   --   FERRITIN 365*  --  164  --   --   --   --   --   --   TIBC 167*  --  227*  --   --   --   --   --   --   IRON 14*  --  12*  --   --   --   --   --   --   RETICCTPCT 1.1  --  2.6  --   --   --   --   --   --    < > = values in this interval not displayed.     Cardiac Enzymes: No results for input(s): "CKTOTAL", "CKMB", "CKMBINDEX", "TROPONINI" in the last 168 hours. CBG: Recent Labs  Lab 10/26/21 1101 10/26/21 1610  10/26/21 1710 10/26/21 2154 10/27/21 0744  GLUCAP 144* 152* 150* 223* 131*     Iron Studies: No results for input(s): "IRON", "TIBC", "TRANSFERRIN", "FERRITIN" in the last 72 hours. Studies/Results: No results found.  Medications: Infusions:  ferric gluconate (FERRLECIT) IVPB 250 mg (10/27/21 0928)    Scheduled Medications:  amLODipine  10 mg Oral Daily   ascorbic acid  500 mg Oral BID   aspirin  81 mg Oral Daily   atorvastatin  80 mg Oral QHS   brimonidine  1 drop Both Eyes BID   calcium acetate  667 mg Oral TID WC   Chlorhexidine Gluconate  Cloth  6 each Topical Daily   dorzolamide-timolol  1 drop Both Eyes BID   feeding supplement (NEPRO CARB STEADY)  237 mL Oral QHS   furosemide  80 mg Oral BID   heparin injection (subcutaneous)  5,000 Units Subcutaneous Q8H   latanoprost  1 drop Both Eyes QHS   multivitamin  1 tablet Oral QHS   pantoprazole  40 mg Oral Daily   pregabalin  75 mg Oral Daily   sertraline  50 mg Oral QHS   sodium bicarbonate  1,300 mg Oral BID   tamsulosin  0.8 mg Oral Daily    have reviewed scheduled and prn medications.  Physical Exam: General:NAD, comfortable, trying to eat breakfast Heart:RRR, s1s2 nl, no rubs Lungs: Clear bilateral, no increased work of breathing. Abdomen:soft, Non-tender, non-distended Extremities: Left BKA, peripheral edema present+,  Looks chronic Neurology: Alert, awake, nonfocal and no asterixis  Marston Mccadden Prasad Blenda Wisecup 10/27/2021,10:33 AM  LOS: 8 days

## 2021-10-27 NOTE — Plan of Care (Signed)
  Problem: Clinical Measurements: Goal: Will remain free from infection Outcome: Progressing   Problem: Clinical Measurements: Goal: Diagnostic test results will improve Outcome: Progressing   Problem: Pain Managment: Goal: General experience of comfort will improve Outcome: Progressing

## 2021-10-27 NOTE — Progress Notes (Addendum)
Daily Progress Note Intern Pager: [phone]  Patient name: [name] Medical record number: [id number] Date of birth: [birth year] Age: 61 y.o. Gender: male  Primary Care Provider: Jodi Marble, MD Consultants: nephrology, palliative  Code Status: FULL   Pt Overview and Major Events to Date:  10/19/21: Admitted to FPTS   Assessment and Plan:  [name] is a 61 y.o. male with a PMH of CHF, DM2, HTN, TIA, large CVA resulting in R sided hemiparesis, hx DVT and left BKA, bipolar disorder, and MDD admitted for acute hypoxic respiratory failure 2/2  bilateral pneumonia superimposed on acute exacerbation of HFpEF but currently on RA. Currently in process of diuresis while treating PNA with GOC discussion pending.  * AKI (acute kidney injury) ([place]) Acute on chronic kidney disease IIIa. Baseline last recorded Cr 0.74 and GFR >60 in Feb 2023. UA shows chronic proteinuria. Most recent Creatinine 3.69 and GFR 18. Per renal, multifactorial etiology including IV contrast on admission, CHF exacerbation/PNA (prerenal) and severe anemia. Renal ultrasound showed no acute abnormality. Most recent output 5.3L since admission. Completed IV Lasix, albumin and metolazone per nephro.  - Nephrology following, appreciate care and recommendations  - Continue Lasix 80 mg PO BID - Continue phosphate binder, Phoslo 667 mg  - Continue to trend daily BMP  - Avoid nephrotoxic agents - Strict I/Os and daily weights  - Foley in place; consider d/c when appropriate  - Consider HD if no improvement in renal function  -  plan for family meeting to discuss Elim 9/2 today  Pneumonia Originally septic with fever. Blood cultures NG5D. Completed Azithro 3 day course and CTX 7 day course for PNA which was confirmed with imaging findings, fever, and sputum production. - Flutter valve and incentive spirometry  Iron deficiency anemia Hx of microcytic anemia>likely IDA. Hb 8.7 and Hct 30 at baseline, 6.4 on morning  of 10/21/21 requiring 1 uPRBCs. Pending AM Hgb  - Continue IV iron x 4 doses - Continue 40 mg Protonix  - Nutrition consult placed  - Trend daily H&H  - Threshold for transfusion <7 - Consider workup for chronic GI bleed if he requires another transfusion    Swelling of arm Right sided weakness from prior CVA. Right arm swollen at baseline due to immobility and fluid overload. Currently no redness or streaking noted. US showed no DVT.  - Monitor right arm circumference: 15 inches  Metabolic acidosis CO2 21 today. Most likely secondary to acute PNA on chronic CHF with exacerbation in addition to CKD stage IIIa.  - Continue sodium bicarbonate tablet 1,300 mg PO BID  (HFpEF) heart failure with preserved ejection fraction ([place]) Echo 10/21/21 revealed EF of 60-65% and Grade 2 diastolic dysfunction with moderate pulmonary hypertension, moderate aortic stenosis, and small pericardial effusion.  - Strict I/Os and daily weights  - Diurese per nephrology     Type 2 diabetes mellitus (HCC) Chronic problem. On sliding scale lispro at home. Glucose at 220s x1 check last night. Hb A1c 5.8. Await AM check, may need SSI - CBG checks AC HS - Consider starting sliding scale insulin if sugars persistently >180  - Encourage nutrition with diabetic diet   Multiple thyroid nodules Chest CT angio revealed "multiple thyroid nodules measuring up to 2.1 cm in maximum diameter each, with progression.  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  - On  home atorvastatin 80 mg  - Mobile at [facility], PT/OT consult placed, appreciate recs   Pressure injury of skin Sacral ulcer present per SNF documentation. Unable to visualize due to inability to turn patient without multiple staff assistance. - Wound care nurse consulted, appreciate care and recs   BPH (benign prostatic hyperplasia) Chronic, home  Flomax ordered  Diabetic neuropathy (HCC) Chronic. On home Lyrica 75 mg daily  MDD (major depressive disorder), recurrent episode, severe (HCC) Mood and affect currently appropriate to situation, on home Zoloft 60 mg   Hypertension Chronic  -On home amlodipine 10 mg PO daily   Fever-resolved as of 10/20/2021 Fever of 100.8 in ED, likely pneumonia.         FEN/GI: Regular  PPx: SQ hep  Dispo:SNF pending clinical improvement . Barriers include GOC discussion.   Subjective:  Patient expresses concern with multiple sugar checks and wants to decide when to have his finger pricked. Otherwise, he is ready for his [place] meeting today.   Objective: Temp:  [97.7 F (36.5 C)-98.8 F (37.1 C)] 97.7 F (36.5 C) (09/02 0852) Pulse Rate:  [70-79] 77 (09/02 0852) Resp:  [14-17] 14 (09/02 0852) BP: (135-181)/(81-91) 147/85 (09/02 0852) SpO2:  [96 %-100 %] 100 % (09/02 0852) Weight:  [122.7 kg] 122.7 kg (09/02 0500) Physical Exam: General: right sided hemiplegia, appropriately responsive this AM  Cardiovascular: RRR with 3/6 systolic murmur  Respiratory: Normal WOB, scant rales throughout with otherwise clear findings  Abdomen: NTTP, soft and nondistended  Extremities: BKA on left with right sided 1+ pitting edema, DP on right palpable    Right arm 2+ pitting edema, NTTP without erythema noted   Laboratory: Most recent CBC Lab Results  Component Value Date   WBC 4.9 10/27/2021   HGB 7.9 (L) 10/27/2021   HCT 25.3 (L) 10/27/2021   MCV 80.3 10/27/2021   PLT 229 10/27/2021   Most recent BMP    Latest Ref Rng & Units 10/27/2021    8:19 AM  BMP  Glucose 70 - 99 mg/dL 130   BUN 6 - 20 mg/dL 49   Creatinine 0.61 - 1.24 mg/dL 3.69   Sodium 135 - 145 mmol/L 139   Potassium 3.5 - 5.1 mmol/L 3.5   Chloride 98 - 111 mmol/L 112   CO2 22 - 32 mmol/L 21   Calcium 8.9 - 10.3 mg/dL 7.9      Roy Emery, MD 10/27/2021, 9:15 AM  PGY-2, [place] Intern pager:  [phone], text pages welcome Secure chat group [place]

## 2021-10-28 DIAGNOSIS — R109 Unspecified abdominal pain: Secondary | ICD-10-CM

## 2021-10-28 DIAGNOSIS — N179 Acute kidney failure, unspecified: Secondary | ICD-10-CM | POA: Diagnosis not present

## 2021-10-28 LAB — RENAL FUNCTION PANEL
Albumin: 1.9 g/dL — ABNORMAL LOW (ref 3.5–5.0)
Anion gap: 8 (ref 5–15)
BUN: 50 mg/dL — ABNORMAL HIGH (ref 6–20)
CO2: 19 mmol/L — ABNORMAL LOW (ref 22–32)
Calcium: 7.8 mg/dL — ABNORMAL LOW (ref 8.9–10.3)
Chloride: 111 mmol/L (ref 98–111)
Creatinine, Ser: 3.45 mg/dL — ABNORMAL HIGH (ref 0.61–1.24)
GFR, Estimated: 19 mL/min — ABNORMAL LOW (ref >60)
Glucose, Bld: 146 mg/dL — ABNORMAL HIGH (ref 70–99)
Phosphorus: 5.6 mg/dL — ABNORMAL HIGH (ref 2.5–4.6)
Potassium: 3.2 mmol/L — ABNORMAL LOW (ref 3.5–5.1)
Sodium: 138 mmol/L (ref 135–145)

## 2021-10-28 LAB — CBC
HCT: 24.3 % — ABNORMAL LOW (ref 39.0–52.0)
Hemoglobin: 7.7 g/dL — ABNORMAL LOW (ref 13.0–17.0)
MCH: 25.2 pg — ABNORMAL LOW (ref 26.0–34.0)
MCHC: 31.7 g/dL (ref 30.0–36.0)
MCV: 79.4 fL — ABNORMAL LOW (ref 80.0–100.0)
Platelets: 219 10*3/uL (ref 150–400)
RBC: 3.06 MIL/uL — ABNORMAL LOW (ref 4.22–5.81)
RDW: 17.5 % — ABNORMAL HIGH (ref 11.5–15.5)
WBC: 4.8 10*3/uL (ref 4.0–10.5)
nRBC: 0 % (ref 0.0–0.2)

## 2021-10-28 LAB — GLUCOSE, CAPILLARY
Glucose-Capillary: 135 mg/dL — ABNORMAL HIGH (ref 70–99)
Glucose-Capillary: 126 mg/dL — ABNORMAL HIGH (ref 70–99)
Glucose-Capillary: 133 mg/dL — ABNORMAL HIGH (ref 70–99)
Glucose-Capillary: 145 mg/dL — ABNORMAL HIGH (ref 70–99)

## 2021-10-28 MED ORDER — POTASSIUM CHLORIDE CRYS ER 20 MEQ PO TBCR
40.0000 meq | EXTENDED_RELEASE_TABLET | Freq: Once | ORAL | Status: DC
  Filled 2021-10-28: qty 2

## 2021-10-28 NOTE — Progress Notes (Addendum)
Daily Progress Note Intern Pager: [phone]  Patient name: [name] Medical record number: [id number] Date of birth: 1960/11/15 Age: 61 y.o. Gender: male  Primary Care Provider: Jodi Marble, MD Consultants: nephrology, palliative Code Status: Full  Pt Overview and Major Events to Date:  10/19/21: Admitted to FPTS  Assessment and Plan: [name] is a 61 y.o. 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 PNA now resolved. Diuresing per nephro recs.  * AKI (acute kidney injury) ([place]) Acute on chronic kidney disease IIIa.  - Nephrology following, appreciate care and recommendations  - Continue Lasix 80 mg PO BID - Continue phosphate binder, Phoslo 667 mg  - Continue to trend daily BMP  - Avoid nephrotoxic agents - Strict I/Os and daily weights  - Foley in place; consider d/c when appropriate  - Consider HD if no improvement in renal function   Iron deficiency anemia Pt has new LUQ pain, which could be c/f GI bleed in setting of IDA. Will CTM. - Continue IV iron x 4 doses (last dose today) - Continue 40 mg Protonix  - Nutrition consult placed  - Trend daily H&H  - s/p 1uPRBC 8/27. Threshold for transfusion <7 - Consider workup for chronic GI bleed if he requires another transfusion  - FOBT not collected yet. Hgb stable, will re-consider if Hgb drops.  Pneumonia-resolved as of 10/28/2021 S/p Azithro x3day and CTX x7days.  Swelling of arm Right sided weakness from prior CVA. Right arm swollen at baseline due to immobility and fluid overload. Exam not c/f infection. - Monitor right arm circumference  Metabolic acidosis CO2 19 today, stably low. Most likely secondary to acute PNA on chronic CHF with exacerbation in addition to CKD stage IIIa.  - Continue sodium  bicarbonate tablet 1,300 mg PO BID  (HFpEF) heart failure with preserved ejection fraction ([place]) Echo 10/21/21 revealed EF of 60-65% and Grade 2 diastolic dysfunction with moderate pulmonary hypertension, moderate aortic stenosis, and small pericardial effusion.  - Strict I/Os and daily weights  - Diurese per nephrology   Type 2 diabetes mellitus (HCC) Stable without insulin.  - CBG checks qAC HS - Consider starting sliding scale insulin if sugars persistently >180  - Encourage nutrition with diabetic diet   Abdominal pain Reports new L-sided abm pain that started ON. On exam, mild tenderness to palpation in LUQ. Given concurrent IDA, c/f potential GI ulcer. Pt is not very bothered by this pain and is still POing well. - CTM  Multiple thyroid nodules Chest CT angio revealed "multiple thyroid nodules measuring up to 2.1 cm in maximum diameter each, with progression.  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  - On home atorvastatin 80 mg  - Mobile at [facility], PT/OT consult placed, appreciate recs   Pressure injury of skin Sacral ulcer present per SNF documentation. Unable to visualize due to inability to turn patient without multiple staff assistance. - Wound care nurse consulted, appreciate care and recs   BPH (benign prostatic hyperplasia) Chronic, home Flomax ordered  Hypokalemia K low to 3.2 today, will replete - 22mQ K PO  Diabetic neuropathy (HCC) Chronic. On home Lyrica 75 mg daily  MDD (major  depressive disorder), recurrent episode, severe ([place]) home Zoloft 60 mg   Hypertension Chronic  -On home amlodipine 10 mg PO daily   Fever-resolved as of 10/20/2021 Fever of 100.8 in ED, likely pneumonia.     FEN/GI: Regular PPx: SQ hep Dispo:SNF pending clinical improvement . Barriers include need for close monitoring of fluid status.   Subjective:  NAEO.  Pt reports new L-sided abm discomofrt this morning.  Objective: Temp:  [97.6 F (36.4 C)-98.4 F (36.9 C)] 98.4 F (36.9 C) (09/03 1005) Pulse Rate:  [76-81] 80 (09/03 1005) Resp:  [16-18] 18 (09/03 1005) BP: (146-152)/(81-88) 147/88 (09/03 1005) SpO2:  [96 %-100 %] 96 % (09/03 1005) Physical Exam: General: Chronically-ill appearing man sitting up in bed and eating breakfast. NAD. Cardiovascular: RRR.  Respiratory: CTAB. Normal WOB on RA. Abdomen: Tender to palpation in RUQ. Soft, no guarding, no rebound. Normal BS.  Extremities: 2+ pitting edema in L LE. Thickened skin on L LE. R UE swollen, but nonerythematous and not tense.   Laboratory: Most recent CBC Lab Results  Component Value Date   WBC 4.8 10/28/2021   HGB 7.7 (L) 10/28/2021   HCT 24.3 (L) 10/28/2021   MCV 79.4 (L) 10/28/2021   PLT 219 10/28/2021   Most recent BMP    Latest Ref Rng & Units 10/28/2021    6:59 AM  BMP  Glucose 70 - 99 mg/dL 146   BUN 6 - 20 mg/dL 50   Creatinine 0.61 - 1.24 mg/dL 3.45   Sodium 135 - 145 mmol/L 138   Potassium 3.5 - 5.1 mmol/L 3.2   Chloride 98 - 111 mmol/L 111   CO2 22 - 32 mmol/L 19   Calcium 8.9 - 10.3 mg/dL 7.8     Other pertinent labs none   Imaging/Diagnostic Tests: none  Arlyce Dice, MD 10/28/2021, 1:54 PM  PGY-1, [place] Intern pager: [phone], text pages welcome Secure chat group [facility]

## 2021-10-28 NOTE — Assessment & Plan Note (Signed)
Patient will need to restart his supplements at SNF. K was 3.3 yesterday and repleted.

## 2021-10-28 NOTE — Progress Notes (Addendum)
[facility] NEPHROLOGY PROGRESS NOTE  Assessment/ Plan: Pt is a 61 y.o. yo male  with past medical history of hypertension, diabetes, TIA, DVT, peripheral vascular disease with left BKA, bipolar mood disorder, CHF who was admitted on 8/25 for shortness of breath and fluid overload seen as a consultation for the management of acute kidney injury.   #Acute kidney injury on CKD stage III: Multifactorial etiology including IV contrast on 8/25,CHF exacerbation/PNA and severe anemia and +BOO.  UA with chronic proteinuria without RBC. Bladder scan with acute urinary retention requiring intermittent catheterization.  Kidney ultrasound unremarkable. Initially treated with IV Lasix, metolazone and albumin with good response.  After the respiratory status improved with switch to oral Lasix 80 mg twice a day.  Continue to have good urine output and it seems like serum creatinine level is improving.  No acute need for dialysis, hopefully we can avoid it.  Seen by palliative care team, he would consider dialysis as a last resort and noted family meeting was done yesterday, no note from the meeting available to review.  Daily lab, strict ins and out.   #Acute on chronic diastolic CHF:  Echo with grade 2 diastolic dysfunction.  Breathing is much better but he still has some peripheral edema which seems to be chronic.  Continue diuretics.  Cardiology has seen the patient.   #Metabolic acidosis: Continue sodium bicarbonate.  Monitor serum CO2 level.  #Hypokalemia due to diuretics: Replete potassium chloride.   #Acute hypoxic respiratory failure due to HCAP and CHF exacerbation: Completed antibiotics for pneumonia, diuretics as above. Respiratory status much improved and currently on room air.   #Iron deficiency anemia: Iron saturation is only 5%, receiving IV iron.  Received Aranesp on 8/29.   # HTN: Currently on amlodipine.  Diuretics as above to manage volume.  #Right upper extremity swelling,  localized, ultrasound with no DVT.  No sign of infection.  Subjective: Seen and examined at bedside.  Urine output of 3.2 L.  He denies nausea, vomiting, chest pain, shortness of breath.  No new event. Vital signs in last 24 hours: Vitals:   10/27/21 1708 10/27/21 2133 10/28/21 0506 10/28/21 1005  BP: (!) 152/82 (!) 146/81 (!) 151/87 (!) 147/88  Pulse: 77 81 76 80  Resp: '16 18 18 18  '$ Temp: 97.6 F (36.4 C) 97.6 F (36.4 C) 98.4 F (36.9 C) 98.4 F (36.9 C)  TempSrc: Oral Oral  Oral  SpO2: 100% 99% 97% 96%  Weight:      Height:       Weight change:   Intake/Output Summary (Last 24 hours) at 10/28/2021 1027 Last data filed at 10/28/2021 0900 Gross per 24 hour  Intake 1600.05 ml  Output 2875 ml  Net -1274.95 ml        Labs: RENAL PANEL Recent Labs    03/03/21 0818 03/04/21 0730 03/05/21 0609 03/06/21 0353 03/07/21 0809 03/08/21 0115 03/09/21 0433 04/12/21 0551 10/19/21 1232 10/19/21 1840 10/20/21 0449 10/20/21 1259 10/21/21 0234 10/21/21 1501 10/22/21 0751 10/23/21 0947 10/24/21 1051 10/25/21 0451 10/26/21 0905 10/27/21 0819 10/28/21 0659  NA 134* 132* 133* 134* 130* 136 140 140 140   < > '140 140 139 136 139 138 138 139 138 139 '$ 138  K 4.0 3.9 3.5 3.0* 3.1* 3.9 4.5 3.1* 5.8*   < > 4.9 5.0 4.6 4.7 4.2 3.8 4.1 3.8 3.5 3.5 3.2*  CL 107 105 100 98 91* 98 104 111 115*   < > 118* 117* 116* 113* 115*  113* 113* 113* 112* 112* 111  CO2 15* 14* 20* '22 28 27 28 '$ 19* 16*   < > 16* 16* 16* 15* 15* 16* 17* 18* 18* 21* 19*  GLUCOSE 176* 145* 140* 142* 154* 158* 128* 121* 122*   < > 96 151* 124* 163* 121* 144* 149* 138* 136* 130* 146*  BUN 95* 94* 88* 82* 74* 65* 53* 18 40*   < > 42* 43* 45* 44* 45* 48* 47* 47* 48* 49* 50*  CREATININE 2.91* 2.85* 2.38* 1.93* 1.69* 1.46* 1.21 0.74 2.03*   < > 2.06* 2.20* 2.19* 2.42* 2.87* 3.37* 3.58* 3.74* 3.79* 3.69* 3.45*  CALCIUM 8.2* 8.0* 7.7* 7.7* 7.4* 7.5* 7.8* 8.2* 8.9   < > 8.4* 8.3* 7.9* 7.8* 7.8* 7.9* 7.7* 7.7* 7.8* 7.9* 7.8*  MG 2.2  2.0 1.8 1.9 1.8 1.7 1.9  --   --   --   --  1.9 1.9  --   --   --   --  1.9  --   --   --   PHOS 6.3* 6.1* 4.8* 4.6  --   --   --   --   --   --   --   --   --   --   --  5.9* 6.1* 6.0* 5.9* 5.8* 5.6*  ALBUMIN 2.0* 2.0* 1.9* 1.9*  --   --   --  2.2* 2.8*  --  2.0*  --   --   --   --  2.3* 2.0* 2.0* 2.1* 2.0* 1.9*   < > = values in this interval not displayed.      Liver Function Tests: Recent Labs  Lab 10/26/21 0905 10/27/21 0819 10/28/21 0659  ALBUMIN 2.1* 2.0* 1.9*    No results for input(s): "LIPASE", "AMYLASE" in the last 168 hours.  No results for input(s): "AMMONIA" in the last 168 hours. CBC: Recent Labs    03/02/21 1151 03/03/21 0818 10/20/21 0449 10/21/21 0234 10/24/21 1051 10/25/21 0451 10/26/21 0905 10/27/21 0819 10/28/21 0659  HGB 7.0*   < >  --    < > 7.5* 7.8* 8.2* 7.9* 7.7*  MCV  --    < >  --    < > 82.0 80.8 79.6* 80.3 79.4*  VITAMINB12 471  --  439  --   --   --   --   --   --   FOLATE 7.2  --  6.9  --   --   --   --   --   --   FERRITIN 365*  --  164  --   --   --   --   --   --   TIBC 167*  --  227*  --   --   --   --   --   --   IRON 14*  --  12*  --   --   --   --   --   --   RETICCTPCT 1.1  --  2.6  --   --   --   --   --   --    < > = values in this interval not displayed.     Cardiac Enzymes: No results for input(s): "CKTOTAL", "CKMB", "CKMBINDEX", "TROPONINI" in the last 168 hours. CBG: Recent Labs  Lab 10/27/21 0744 10/27/21 1130 10/27/21 1635 10/27/21 2134 10/28/21 0729  GLUCAP 131* 172* 241* 160* 135*     Iron Studies: No results for  input(s): "IRON", "TIBC", "TRANSFERRIN", "FERRITIN" in the last 72 hours. Studies/Results: No results found.  Medications: Infusions:  ferric gluconate (FERRLECIT) IVPB 250 mg (10/27/21 0928)    Scheduled Medications:  amLODipine  10 mg Oral Daily   ascorbic acid  500 mg Oral BID   aspirin  81 mg Oral Daily   atorvastatin  80 mg Oral QHS   brimonidine  1 drop Both Eyes BID   calcium  acetate  667 mg Oral TID WC   Chlorhexidine Gluconate Cloth  6 each Topical Daily   dorzolamide-timolol  1 drop Both Eyes BID   feeding supplement (NEPRO CARB STEADY)  237 mL Oral QHS   furosemide  80 mg Oral BID   heparin injection (subcutaneous)  5,000 Units Subcutaneous Q8H   latanoprost  1 drop Both Eyes QHS   multivitamin  1 tablet Oral QHS   pantoprazole  40 mg Oral Daily   potassium chloride  40 mEq Oral Once   pregabalin  75 mg Oral Daily   sertraline  50 mg Oral QHS   sodium bicarbonate  1,300 mg Oral BID   tamsulosin  0.8 mg Oral Daily    have reviewed scheduled and prn medications.  Physical Exam: General:NAD, comfortable,  Heart:RRR, s1s2 nl, no rubs Lungs: Clear bilateral, no increased work of breathing. Abdomen:soft, Non-tender, non-distended Extremities: Left BKA, peripheral edema present+,  Looks chronic Neurology: Alert, awake, nonfocal and no asterixis  [name] 10/28/2021,10:27 AM  LOS: 9 days

## 2021-10-28 NOTE — Assessment & Plan Note (Signed)
Reports new L-sided abm pain that started ON. On exam, mild tenderness to palpation in LUQ. Given concurrent IDA, c/f potential GI ulcer. Pt is not very bothered by this pain and is still POing well. - CTM

## 2021-10-28 NOTE — Progress Notes (Signed)
Daily Progress Note   Patient Name: [name]       Date: 10/28/2021 DOB: November 21, 1960  Age: 61 y.o. MRN#: [id number] Attending Physician: Leeanne Rio, MD Primary Care Physician: Jodi Marble, MD Admit Date: 10/19/2021  Reason for Consultation/Follow-up: Establishing goals of care  Subjective: Medical records reviewed including progress notes, labs. Patient assessed at the bedside.  No family present during my visit.  Discussed yesterday's family meeting and explored patient's interactions with his family afterwards.  He tells me his brother has gone through dialysis in the past and would not want him to suffer as he has, although they do ultimately support his decisions.  [name] states that if asked today, he would not agree with dialysis.  He does not feel like he is as strong as his brother to tolerate this.  He also states that he is not sure if this is how he will still feel in the future when it becomes a matter of life and death.  He very much wants to live.  We discussed that he would likely qualify for hospice if he were to be ready for end-of-life.  He again reiterates that he does not want hospice, his mother died on hospice and this is not something he agrees with.  We discussed CODE STATUS and he states "I do not want to hear what people's opinions are on what I should do."  He tells me that he would want to receive CPR, to be placed on the ventilator, and all other interventions that would prolong his life.  Outpatient palliative care was explained and offered.  Encouraged patient to consider referral for ongoing discussions based on his quality of life.  He is agreeable to referral being placed.  Questions and concerns addressed.  PMT will continue to support holistically.    Length of Stay: 9  Physical Exam Vitals and nursing note reviewed.  Constitutional:      General: He is not in acute distress.    Appearance: He is ill-appearing.  Cardiovascular:     Rate and Rhythm: Normal rate.  Pulmonary:     Effort: Pulmonary effort is normal.  Neurological:     Mental Status: He is alert.  Psychiatric:        Mood and Affect: Mood normal.  Behavior: Behavior is cooperative.            Vital Signs: BP (!) 147/88 (BP Location: Left Arm)   Pulse 80   Temp 98.4 F (36.9 C) (Oral)   Resp 18   Ht '5\' 8"'$  (1.727 m)   Wt 122.7 kg   SpO2 96%   BMI 41.13 kg/m  SpO2: SpO2: 96 % O2 Device: O2 Device: Room Air O2 Flow Rate: O2 Flow Rate (L/min): 2 L/min      Palliative Assessment/Data: 30%    Palliative Care Assessment & Plan   Patient Profile: 61 y.o. male  with past medical history of  CHF, Bipolar, MDD, DM2, HTN, TIA, hx of DVT, CKD3, and left BKA admitted on 10/19/2021 with fever, chills, worsening dyspnea.    Patient recently admitted for PNA. Now admitted for acute hypoxic respiratory failure due to PNA and CHF exacerbation, as well as AKI. PMT has been consulted to assist with goals of care conversation.  Assessment: Goals of care conversation AKI Pneumonia Acute respiratory failure CHF Major depressive disorder  Recommendations/Plan: Continue full code Continue full scope treatment - patient is not ready for hospice and notes he would currently decline HD, but he is uncertain about his wishes on this in the future Patient agreeable to outpatient palliative care referral, will consult TOC for assistance Psychosocial emotional support provided PMT will continue to follow and support as needed   Prognosis: Poor  Discharge Planning: SNF with outpatient palliative care follow up  Care plan was discussed with patient   MDM high         [place], PA-C  Palliative Medicine Team Team phone # [phone]  Thank you  for allowing the Palliative Medicine Team to assist in the care of this patient. Please utilize secure chat with additional questions, if there is no response within 30 minutes please call the above phone number.  Palliative Medicine Team providers are available by phone from 7am to 7pm daily and can be reached through the team cell phone.  Should this patient require assistance outside of these hours, please call the patient's attending physician.

## 2021-10-29 DIAGNOSIS — N179 Acute kidney failure, unspecified: Secondary | ICD-10-CM | POA: Diagnosis not present

## 2021-10-29 LAB — CBC
HCT: 26.2 % — ABNORMAL LOW (ref 39.0–52.0)
Hemoglobin: 8.3 g/dL — ABNORMAL LOW (ref 13.0–17.0)
MCH: 25.3 pg — ABNORMAL LOW (ref 26.0–34.0)
MCHC: 31.7 g/dL (ref 30.0–36.0)
MCV: 79.9 fL — ABNORMAL LOW (ref 80.0–100.0)
Platelets: 256 10*3/uL (ref 150–400)
RBC: 3.28 MIL/uL — ABNORMAL LOW (ref 4.22–5.81)
RDW: 17.7 % — ABNORMAL HIGH (ref 11.5–15.5)
WBC: 5.2 10*3/uL (ref 4.0–10.5)
nRBC: 0.4 % — ABNORMAL HIGH (ref 0.0–0.2)

## 2021-10-29 LAB — RENAL FUNCTION PANEL
Albumin: 2 g/dL — ABNORMAL LOW (ref 3.5–5.0)
Anion gap: 10 (ref 5–15)
BUN: 48 mg/dL — ABNORMAL HIGH (ref 6–20)
CO2: 20 mmol/L — ABNORMAL LOW (ref 22–32)
Calcium: 8.1 mg/dL — ABNORMAL LOW (ref 8.9–10.3)
Chloride: 109 mmol/L (ref 98–111)
Creatinine, Ser: 3.33 mg/dL — ABNORMAL HIGH (ref 0.61–1.24)
GFR, Estimated: 20 mL/min — ABNORMAL LOW (ref >60)
Glucose, Bld: 130 mg/dL — ABNORMAL HIGH (ref 70–99)
Phosphorus: 5.5 mg/dL — ABNORMAL HIGH (ref 2.5–4.6)
Potassium: 3 mmol/L — ABNORMAL LOW (ref 3.5–5.1)
Sodium: 139 mmol/L (ref 135–145)

## 2021-10-29 LAB — MAGNESIUM: Magnesium: 1.7 mg/dL (ref 1.7–2.4)

## 2021-10-29 LAB — GLUCOSE, CAPILLARY
Glucose-Capillary: 147 mg/dL — ABNORMAL HIGH (ref 70–99)
Glucose-Capillary: 190 mg/dL — ABNORMAL HIGH (ref 70–99)
Glucose-Capillary: 201 mg/dL — ABNORMAL HIGH (ref 70–99)
Glucose-Capillary: 189 mg/dL — ABNORMAL HIGH (ref 70–99)

## 2021-10-29 MED ORDER — DARBEPOETIN ALFA 100 MCG/0.5ML IJ SOSY
100.0000 ug | PREFILLED_SYRINGE | INTRAMUSCULAR | Status: DC
  Administered 2021-10-30: 100 ug via SUBCUTANEOUS
  Filled 2021-10-29: qty 0.5

## 2021-10-29 MED ORDER — POTASSIUM CHLORIDE 20 MEQ PO PACK: 60.0000 meq | PACK | Freq: Once | ORAL | Status: DC

## 2021-10-29 MED ORDER — POTASSIUM CHLORIDE CRYS ER 20 MEQ PO TBCR
40.0000 meq | EXTENDED_RELEASE_TABLET | Freq: Two times a day (BID) | ORAL | Status: DC
  Administered 2021-10-29: 40 meq via ORAL
  Filled 2021-10-29: qty 2

## 2021-10-29 MED ORDER — POTASSIUM CHLORIDE CRYS ER 20 MEQ PO TBCR
40.0000 meq | EXTENDED_RELEASE_TABLET | Freq: Two times a day (BID) | ORAL | Status: DC
Start: 2021-10-29 — End: 2021-10-29
  Administered 2021-10-29: 40 meq via ORAL
  Filled 2021-10-29: qty 2

## 2021-10-29 NOTE — Progress Notes (Signed)
[facility] Progress Note  Name: [name] MRN: [id number] DOB: 1960/06/27   Subjective:  He had 4.0 L uop over 9/3 charted.  He has already had 1.7 liters UOP over 9/4 thus far per charting.  States he's been given conflicting messages about labs and fluids - I let him know AKI was better but kidney function is abnormal.  I have asked that he restrict fluids   Review of systems:  Denies n/v  Denies shortness of breath    Intake/Output Summary (Last 24 hours) at 10/29/2021 1146 Last data filed at 10/29/2021 1000 Gross per 24 hour  Intake 1590 ml  Output 5625 ml  Net -4035 ml    Vitals:  Vitals:   10/28/21 1641 10/28/21 2200 10/29/21 0603 10/29/21 0922  BP: (!) 146/95 (!) 154/93 (!) 146/80 (!) 145/85  Pulse: 80 80 76 75  Resp: '18 17 18 18  '$ Temp: 98.2 F (36.8 C) 97.8 F (36.6 C) 98.4 F (36.9 C) 98 F (36.7 C)  TempSrc:  Oral  Oral  SpO2:  98% 98% 98%  Weight:      Height:         Physical Exam:  General adult male in bed in no acute distress HEENT normocephalic atraumatic extraocular movements intact sclera anicteric Neck supple trachea midline Lungs clear to auscultation bilaterally normal work of breathing at rest  Heart S1S2 no rub Abdomen soft nontender nondistended Extremities 1+ edema right leg and left BKA Psych normal mood and affect   Medications reviewed   Labs:     Latest Ref Rng & Units 10/29/2021    2:51 AM 10/28/2021    6:59 AM 10/27/2021    8:19 AM  BMP  Glucose 70 - 99 mg/dL 130  146  130   BUN 6 - 20 mg/dL 48  50  49   Creatinine 0.61 - 1.24 mg/dL 3.33  3.45  3.69   Sodium 135 - 145 mmol/L 139  138  139   Potassium 3.5 - 5.1 mmol/L 3.0  3.2  3.5   Chloride 98 - 111 mmol/L 109  111  112   CO2 22 - 32 mmol/L '20  19  21   '$ Calcium 8.9 - 10.3 mg/dL 8.1  7.8  7.9      Assessment/Plan:   Pt is a 61 y.o. yo male  with past medical history of hypertension, diabetes, TIA, DVT, peripheral vascular disease with left BKA, bipolar  mood disorder, CHF who was admitted on 8/25 for shortness of breath and fluid overload seen as a consultation for the management of acute kidney injury.    #Acute kidney injury on CKD stage III: Multifactorial etiology including IV contrast on 8/25, CHF exacerbation/PNA and severe anemia and +bladder outlet obstruction.  UA with chronic proteinuria without RBC. Bladder scan with acute urinary retention requiring intermittent catheterization.  Kidney ultrasound unremarkable.  Initially treated with IV Lasix, metolazone and albumin with good response.   - continue lasix PO at current regimen   - Continue to have good urine output and cr improving.  No acute need for dialysis, hopefully we can avoid it.  He is deconditioned and from SNF    # Urinary retention - has received in/out cath previously  - check post-void residual bladder scan and place foley if over 250 mL retained    #Acute on chronic diastolic CHF:  Echo with grade 2 diastolic dysfunction.  breathing better; some peripheral edema which seems to be chronic.  Continue diuretics as above - need daily weights   #Metabolic acidosis: Continue sodium bicarbonate.   #Hypokalemia due to diuretics: he has gotten two doses of potassium chloride and appears ordered for a third - this order was discontinued    #Acute hypoxic respiratory failure due to HCAP and CHF exacerbation: Completed antibiotics for pneumonia, diuretics as above. Respiratory status much improved per charting and currently on room air.   #Iron deficiency anemia: Iron saturation is only 5%, s/p IV iron x 4 doses.  Received Aranesp 60 mcg on 8/29 - redose at 100 mcg every Tuesday for now    # HTN: continue current regimen    #Right upper extremity swelling, localized, ultrasound with no DVT.  No sign of infection.  Disposition - continue inpatient monitoring - hopeful for discharge back to SNF soon.     Claudia Desanctis, MD 10/29/2021 12:07 PM

## 2021-10-29 NOTE — Progress Notes (Signed)
Daily Progress Note Intern Pager: [phone]  Patient name: [name] Medical record number: [id number] Date of birth: [birth year] Age: 61 y.o. 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 Diuresing per nephro recs.  * AKI (acute kidney injury) ([place]) Acute on chronic kidney disease IIIa.  - Nephrology following, appreciate care and recommendations  - Continue Lasix 80 mg PO BID - Continue phosphate binder, Phoslo 667 mg  - Continue to trend daily BMP  - Avoid nephrotoxic agents - Strict I/Os and daily weights  - Foley in place; consider d/c when appropriate  - Per palliative, pt is not interested in HD at this time  Iron deficiency anemia Hgb improving s/p iron infusions. - Continue 40 mg Protonix  - Trend daily H&H  - Nutrition consulted - s/p 1uPRBC 8/27. Threshold for transfusion <7  Hypokalemia K continues to downtrend, pt was not taking supplements. Discussed with pt and he is willing to try today. - 16mQ K PO x2 today  Swelling of arm Right sided weakness from prior CVA. Right arm swollen at baseline due to immobility and fluid overload.  - Monitor right arm circumference: 15cm 9/4 (stable)  Metabolic acidosis CO2 stably low.  - Continue sodium bicarbonate tablet 1,300 mg PO BID  (HFpEF) heart failure with preserved ejection fraction ([place] Echo 10/21/21 revealed EF of 60-65% and Grade 2 diastolic dysfunction with moderate pulmonary hypertension, moderate aortic stenosis, and small pericardial effusion. Having good UOP. - Strict I/Os and daily weights  - Diurese per nephrology   Type 2 diabetes  mellitus (HCC) Stable without insulin.  - CBG checks qAC HS  Hypertension Chronic  -On home amlodipine 10 mg PO daily   Multiple thyroid nodules Chest CT angio revealed "multiple thyroid nodules measuring up to 2.1 cm in maximum diameter each, with progression.  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  - On home atorvastatin 80 mg  [facility], appreciate recs   Pressure injury of skin Sacral ulcer present per SNF documentation. Unable to visualize due to inability to turn patient without multiple staff assistance. - Wound care nurse consulted, appreciate care and recs   BPH (benign prostatic hyperplasia) Chronic, home Flomax ordered  Diabetic neuropathy (HCC) Chronic. On home Lyrica 75 mg daily  MDD (major depressive disorder), recurrent episode, severe ([facility] home Zoloft 60 mg   Abdominal pain-resolved as of 10/29/2021 Pt reports abm has now resolved. Nontender to palpation on exam.  Pneumonia-resolved as of 10/28/2021 S/p Azithro x3day and CTX x7days.  Fever-resolved as of 10/20/2021 Fever of 100.8 in ED, likely pneumonia.     FEN/GI: Regular diet PPx: heparin Dispo:SNF pending clinical improvement . Barriers include need for diuresis.   Subjective:  NAEO. Pt has not been taking his K supplements yesterday. Discussed importance of this med and pt is willing to try today.   Objective: Temp:  [97.8 F (36.6 C)-98.4 F (36.9 C)] 98 F (36.7 C) (09/04 0922) Pulse Rate:  [75-80] 75 (  09/04 7829) Resp:  [17-18] 18 (09/04 0922) BP: (145-154)/(80-95) 145/85 (09/04 0922) SpO2:  [98 %] 98 % (09/04 5621) Physical Exam: General: Sleeping, but easily arousable. NAD. Cardiovascular: 3/6 systolic murmur best heard at RUS border. RRR.  Respiratory: Normal WOB on RA. Abdomen: Nontender, nondistended, soft. Normal BS.    Laboratory: Most recent CBC Lab Results  Component Value Date   WBC 5.2 10/29/2021   HGB 8.3 (L) 10/29/2021   HCT 26.2 (L) 10/29/2021   MCV 79.9 (L) 10/29/2021   PLT 256 10/29/2021   Most recent BMP    Latest Ref Rng & Units 10/29/2021    2:51 AM  BMP  Glucose 70 - 99 mg/dL 130   BUN 6 - 20 mg/dL 48   Creatinine 0.61 - 1.24 mg/dL 3.33   Sodium 135 - 145 mmol/L 139   Potassium 3.5 - 5.1 mmol/L 3.0   Chloride 98 - 111 mmol/L 109   CO2 22 - 32 mmol/L 20   Calcium 8.9 - 10.3 mg/dL 8.1     Other pertinent labs None   Imaging/Diagnostic Tests: None  Arlyce Dice, MD 10/29/2021, 11:55 AM  PGY-1, [place] Intern pager: [phone], text pages welcome Secure chat group [place]

## 2021-10-30 DIAGNOSIS — Z20822 Contact with and (suspected) exposure to covid-19: Secondary | ICD-10-CM | POA: Diagnosis not present

## 2021-10-30 DIAGNOSIS — A419 Sepsis, unspecified organism: Secondary | ICD-10-CM | POA: Diagnosis not present

## 2021-10-30 DIAGNOSIS — Z515 Encounter for palliative care: Secondary | ICD-10-CM | POA: Diagnosis not present

## 2021-10-30 DIAGNOSIS — N179 Acute kidney failure, unspecified: Secondary | ICD-10-CM | POA: Diagnosis not present

## 2021-10-30 DIAGNOSIS — I5033 Acute on chronic diastolic (congestive) heart failure: Secondary | ICD-10-CM | POA: Diagnosis not present

## 2021-10-30 LAB — RENAL FUNCTION PANEL
Albumin: 2 g/dL — ABNORMAL LOW (ref 3.5–5.0)
Anion gap: 12 (ref 5–15)
BUN: 49 mg/dL — ABNORMAL HIGH (ref 6–20)
CO2: 22 mmol/L (ref 22–32)
Calcium: 8.2 mg/dL — ABNORMAL LOW (ref 8.9–10.3)
Chloride: 105 mmol/L (ref 98–111)
Creatinine, Ser: 3.14 mg/dL — ABNORMAL HIGH (ref 0.61–1.24)
GFR, Estimated: 22 mL/min — ABNORMAL LOW (ref >60)
Glucose, Bld: 185 mg/dL — ABNORMAL HIGH (ref 70–99)
Phosphorus: 4.7 mg/dL — ABNORMAL HIGH (ref 2.5–4.6)
Potassium: 3.3 mmol/L — ABNORMAL LOW (ref 3.5–5.1)
Sodium: 139 mmol/L (ref 135–145)

## 2021-10-30 LAB — GLUCOSE, CAPILLARY
Glucose-Capillary: 171 mg/dL — ABNORMAL HIGH (ref 70–99)
Glucose-Capillary: 161 mg/dL — ABNORMAL HIGH (ref 70–99)
Glucose-Capillary: 198 mg/dL — ABNORMAL HIGH (ref 70–99)
Glucose-Capillary: 222 mg/dL — ABNORMAL HIGH (ref 70–99)

## 2021-10-30 MED ORDER — ORAL CARE MOUTH RINSE
15.0000 mL | OROMUCOSAL | Status: DC | PRN
Start: 2021-10-30 — End: 2021-11-01
  Administered 2021-11-01: 15 mL via OROMUCOSAL

## 2021-10-30 MED ORDER — POTASSIUM CHLORIDE 20 MEQ PO PACK
40.0000 meq | PACK | Freq: Once | ORAL | Status: AC
  Administered 2021-10-30: 40 meq via ORAL
  Filled 2021-10-30: qty 2

## 2021-10-30 MED ORDER — POTASSIUM CHLORIDE 20 MEQ PO PACK: 40.0000 meq | PACK | Freq: Two times a day (BID) | ORAL | Status: DC

## 2021-10-30 MED ORDER — POTASSIUM CHLORIDE CRYS ER 20 MEQ PO TBCR
40.0000 meq | EXTENDED_RELEASE_TABLET | Freq: Once | ORAL | Status: DC
Start: 2021-10-30 — End: 2021-10-30
  Filled 2021-10-30: qty 2

## 2021-10-30 MED ORDER — POTASSIUM CHLORIDE 20 MEQ PO PACK: 40.0000 meq | PACK | Freq: Once | ORAL | Status: DC

## 2021-10-30 MED ORDER — POTASSIUM CHLORIDE 20 MEQ PO PACK
40.0000 meq | PACK | Freq: Once | ORAL | Status: AC
Start: 2021-10-30 — End: 2021-10-30
  Administered 2021-10-30: 40 meq via ORAL
  Filled 2021-10-30: qty 2

## 2021-10-30 NOTE — Progress Notes (Signed)
[facility] Progress Note  Name: [name] MRN: [id number] DOB: 10-23-1960   Subjective:  He had 4.3 L uop over 9/4 charted.  His foley was removed earlier today.  Feels ok.  He confirms that he will be going back to his SNF - he does want to follow-up with nephrology in clinic after discharge though.   Review of systems:   Denies n/v  Denies shortness of breath  Denies chest pain  Foley out  Intake/Output Summary (Last 24 hours) at 10/30/2021 1208 Last data filed at 10/30/2021 8676 Gross per 24 hour  Intake 720 ml  Output 2676 ml  Net -1956 ml    Vitals:  Vitals:   10/29/21 1709 10/29/21 2151 10/30/21 0626 10/30/21 0935  BP: 133/73 134/80 (!) 148/81 (!) 146/84  Pulse: 73 74 75 76  Resp: '19 19 17 18  '$ Temp: 98.3 F (36.8 C) 98.3 F (36.8 C) 98.8 F (37.1 C) 98 F (36.7 C)  TempSrc:   Oral   SpO2: 100% 98% 100% 100%  Weight:      Height:         Physical Exam:    General adult male in bed in no acute distress HEENT normocephalic atraumatic extraocular movements intact sclera anicteric Neck supple trachea midline Lungs clear to auscultation bilaterally normal work of breathing at rest  Heart S1S2 no rub Abdomen soft nontender nondistended Extremities 1-2+ edema right leg and left BKA Psych normal mood and affect GU foley is out   Medications reviewed   Labs:     Latest Ref Rng & Units 10/30/2021    2:20 AM 10/29/2021    2:51 AM 10/28/2021    6:59 AM  BMP  Glucose 70 - 99 mg/dL 185  130  146   BUN 6 - 20 mg/dL 49  48  50   Creatinine 0.61 - 1.24 mg/dL 3.14  3.33  3.45   Sodium 135 - 145 mmol/L 139  139  138   Potassium 3.5 - 5.1 mmol/L 3.3  3.0  3.2   Chloride 98 - 111 mmol/L 105  109  111   CO2 22 - 32 mmol/L '22  20  19   '$ Calcium 8.9 - 10.3 mg/dL 8.2  8.1  7.8      Assessment/Plan:   Pt is a 61 y.o. yo male  with past medical history of hypertension, diabetes, TIA, DVT, peripheral vascular disease with left BKA, bipolar mood disorder, CHF  who was admitted on 8/25 for shortness of breath and fluid overload seen as a consultation for the management of acute kidney injury.    #Acute kidney injury on CKD stage III: Multifactorial etiology including IV contrast on 8/25, CHF exacerbation/PNA and severe anemia and +bladder outlet obstruction.  UA with chronic proteinuria without RBC. Bladder scan with acute urinary retention requiring intermittent catheterization.  Kidney ultrasound unremarkable.  Initially treated with IV Lasix, metolazone and albumin with good response.   - continue lasix PO at current regimen - lasix 80 mg PO BID   - Continues to have good urine output and cr improving    # Urinary retention - has received in/out cath previously and then foley was placed - discussed with team - ok for voiding trial. Monitor for retention per nursing protocol after foley removal   #Acute on chronic diastolic CHF:  Echo with grade 2 diastolic dysfunction.  breathing better; some peripheral edema which seems to be chronic.  Continue diuretics as above - need  daily weights - re-ordered same   #Metabolic acidosis: Continue sodium bicarbonate.   #Hypokalemia due to diuretics: repleted.  Will give one additional dose of potassium 40 meq once today    #Acute hypoxic respiratory failure due to HCAP and CHF exacerbation: Completed antibiotics for pneumonia, diuretics as above. Respiratory status much improved per charting    #Iron deficiency anemia: Iron saturation is only 5%, s/p IV iron x 4 doses.  Received Aranesp 60 mcg on 8/29 - redose at 100 mcg every Tuesday for now    # HTN: continue current regimen    #Right upper extremity swelling, localized, ultrasound with no DVT.  No sign of infection.  Disposition - disposition per primary team.  Will need nephrology follow-up when discharged back to SNF.  I will set up    Claudia Desanctis, MD 10/30/2021 12:28 PM

## 2021-10-30 NOTE — Progress Notes (Addendum)
Daily Progress Note Intern Pager: [phone]  Patient name: [name] Medical record number: [id number] Date of birth: [birth year] Age: 61 y.o. Gender: male  Primary Care Provider: Jodi Marble, MD Consultants: nephrology, palliative  Code Status: FULL  Pt Overview and Major Events to Date:  10/19/21: Admitted to FPTS   Assessment and Plan: JOVANE FOUTZ is a 61 y.o. 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 PNA now resolved. Diuresing per nephro recs. Ongoing [place] discussions with palliative - patient would like all measures to prolong life and avoid dialysis.    * AKI (acute kidney injury) ([place]) Acute on chronic kidney disease IIIa.  - Nephrology following, appreciate care and recommendations  - Continue Lasix 80 mg PO BID - Continue phosphate binder, Phoslo 667 mg  - Foley removed for a void trial today - Continue to trend daily BMP  - Avoid nephrotoxic agents - Strict I/Os and daily weights   - Per palliative, pt is not interested in HD at this time  Iron deficiency anemia Hgb improving s/p iron infusions. - Continue 40 mg Protonix  - Trend daily H&H  - Nutrition consulted - s/p 1uPRBC 8/27. Threshold for transfusion <7  Hypokalemia K continues to downtrend, pt was not taking supplements. Discussed with pt and he is willing to try today. - 3.3 overnight, was repleted 40 mEQ x 2 doses   Swelling of arm Right sided weakness from prior CVA. Right arm swollen at baseline due to immobility and fluid overload.  - Monitor right arm circumference: 15cm 9/4 (stable)  Metabolic acidosis CO2 stably low.  - Continue sodium bicarbonate tablet 1,300 mg PO BID  (HFpEF) heart failure with preserved ejection fraction ([place]) Echo 10/21/21 revealed EF of  60-65% and Grade 2 diastolic dysfunction with moderate pulmonary hypertension, moderate aortic stenosis, and small pericardial effusion. Having good UOP. - Strict I/Os and daily weights  - Diurese per nephrology   Type 2 diabetes mellitus (HCC) Stable without insulin.  - CBG checks qAC HS  Multiple thyroid nodules Chest CT angio revealed "multiple thyroid nodules measuring up to 2.1 cm in maximum diameter each, with progression.  -TSH 1.541 WNL  -Recommend follow up outpatient for thyroid ultrasound on d/c  Pressure injury of skin Sacral ulcer present per SNF documentation. Unable to visualize due to inability to turn patient without multiple staff assistance. - Wound care nurse consulted, appreciate care and recs   Chronic problems: Amlodipine for HTN, atorvastatin for PAD, Flomax for BPH, Lyrica for diabetic neuropathy, Zoloft for MDD.  FEN/GI: Regular diet  PPx: Heparin Dispo:SNF pending clinical improvement . Barriers include diuresis.   Subjective:  Patient seen at bedside resting comfortably and eating his breakfast. He denies chest pain, shortness of breath, or pain anywhere. He says that his right arm is slightly uncomfortable because he cannot move it. He describes that his family meeting went well over the weekend. He denies any urinary pain or discomfort in groin.  Objective: Temp:  [98 F (36.7 C)-98.8 F (37.1 C)] 98 F (36.7 C) (09/05 0935) Pulse Rate:  [73-76] 76 (09/05 0935) Resp:  [17-19] 18 (09/05 0935) BP: (133-148)/(73-84) 146/84 (09/05 0935) SpO2:  [98 %-100 %] 100 % (09/05 0935) Physical Exam: General: Alert, oriented, in no apparent distress  Cardiovascular: 3/6 systolic continuous murmur, RRR S1 S2 present. No rubs or gallops.  Respiratory: CTA bilaterally, no significant increased work of breathing. No longer pausing to take breaths while speaking.  Extremities: Right upper extremity 2+ pitting edema  Laboratory: Most recent CBC Lab Results   Component Value Date   WBC 5.2 10/29/2021   HGB 8.3 (L) 10/29/2021   HCT 26.2 (L) 10/29/2021   MCV 79.9 (L) 10/29/2021   PLT 256 10/29/2021   Most recent BMP    Latest Ref Rng & Units 10/30/2021    2:20 AM  BMP  Glucose 70 - 99 mg/dL 185   BUN 6 - 20 mg/dL 49   Creatinine 0.61 - 1.24 mg/dL 3.14   Sodium 135 - 145 mmol/L 139   Potassium 3.5 - 5.1 mmol/L 3.3   Chloride 98 - 111 mmol/L 105   CO2 22 - 32 mmol/L 22   Calcium 8.9 - 10.3 mg/dL 8.2    EKG 10/30/21: Sinus rhythm with 1st degree AV block, No ST elevation in 2 contiguous leads, QT c 456  Eshraghi, Sampson Si, Medical Student 10/30/2021, 1:21 PM  [place] Intern pager: [phone], text pages welcome Secure chat group [place] Upper-Level Resident Addendum   I have independently interviewed and examined the patient. I have discussed the above with the original author and agree with their documentation. My edits for correction/addition/clarification are in within the document. Please see also any attending notes.   Rise Patience, DO  PGY-3, [facility] 10/30/2021 1:35 PM  FPTS Service pager: [phone] (text pages welcome through [facility])

## 2021-10-30 NOTE — TOC Progression Note (Addendum)
Transition of Care [facility]) - Initial/Assessment Note    Patient Details  Name: [name] MRN: [id number] Date of Birth: 1960-06-08  [facility] CM/SW Contact:    Milinda Antis, [place] Phone Number: 10/30/2021, 11:21 AM  Clinical Narrative:                 Patient to discharge to [facility] with outpatient palliative when medically ready.    TOC will continue to follow.   Patient Goals and CMS Choice        Expected Discharge Plan and Services                                                Prior Living Arrangements/Services                       Activities of Daily Living Home Assistive Devices/Equipment: None ADL Screening (condition at time of admission) Patient's cognitive ability adequate to safely complete daily activities?: Yes Is the patient deaf or have difficulty hearing?: No Does the patient have difficulty seeing, even when wearing glasses/contacts?: No Does the patient have difficulty concentrating, remembering, or making decisions?: Yes Patient able to express need for assistance with ADLs?: Yes Does the patient have difficulty dressing or bathing?: Yes Independently performs ADLs?: No Communication: Independent Dressing (OT): Needs assistance Is this a change from baseline?: Pre-admission baseline Grooming: Needs assistance Is this a change from baseline?: Pre-admission baseline Feeding: Needs assistance Is this a change from baseline?: Pre-admission baseline Bathing: Needs assistance Is this a change from baseline?: Pre-admission baseline Toileting: Needs assistance Is this a change from baseline?: Pre-admission baseline In/Out Bed: Needs assistance Is this a change from baseline?: Pre-admission baseline Walks in Home: Needs assistance Is this a change from baseline?: Pre-admission baseline Does the patient have difficulty walking or climbing stairs?: Yes Weakness of Legs: Both Weakness of Arms/Hands:  None  Permission Sought/Granted                  Emotional Assessment              Admission diagnosis:  Community acquired pneumonia [J18.9] Hypoxia [R09.02] Hemoptysis [R04.2] Community acquired pneumonia, unspecified laterality [J18.9] Patient Active Problem List   Diagnosis Date Noted   Swelling of arm 81/82/9937   Metabolic acidosis 16/96/7893   Community acquired pneumonia    Multiple thyroid nodules 10/19/2021   (HFpEF) heart failure with preserved ejection fraction ([place]) 10/19/2021   Diabetic foot infection ([place]) 03/01/2021   PAD (peripheral artery disease) ([place]) 03/01/2021   Glaucoma (increased eye pressure) 03/01/2021   Pressure injury of skin 11/19/2020   Ileus ([place]) 11/18/2020   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   Depression 11/18/2020   Acute renal failure superimposed on stage 3a chronic kidney disease ([place]) 11/18/2020   BPH (benign prostatic hyperplasia) 11/18/2020   AKI (acute kidney injury) ([place]) 08/22/2020   CVA ([place]) with residual right-sided weakness 08/22/2020   Pericardial effusion 08/22/2020   Acute on chronic diastolic CHF (congestive heart failure) ([place]) 08/22/2020   TIA (transient ischemic attack) 07/03/2020   Acute on chronic heart failure with preserved ejection fraction ([place])    Pulmonary artery hypertension ([place]) 06/25/2020   History of cerebrovascular accident (CVA) with residual deficit 06/25/2020   CKD stage IIIa ([place]) 03/30/2020   Morbid (severe) obesity due to excess calories (  [place]) 03/30/2020   Malnutrition of moderate degree 01/25/2020   Palliative care by specialist    Goals of care, counseling/discussion    Nephrotic syndrome 01/06/2020   Anasarca 01/06/2020   Hypokalemia due to loss of potassium 01/04/2020   Generalized body aches 01/01/2020   Non-traumatic rhabdomyolysis 01/01/2020   Chronic diastolic CHF (congestive heart failure) ([place]) 01/01/2020   Iron deficiency anemia 01/01/2020   Hypokalemia 12/31/2019   Elevated CK     Uncontrolled type 2 diabetes mellitus with hyperglycemia ([place])    Pure hypercholesterolemia    Hyperglycemia 09/28/2017   Nonobstructive atherosclerosis of coronary artery 09/28/2017   MDD (major depressive disorder), recurrent episode, severe ([place]) 07/30/2017   Osteoarthritis 04/12/2015   Severe recurrent major depression with psychotic features ([place]) 01/04/2015   Substance-related disorder ([place]) 10/30/2014   Patient's noncompliance with other medical treatment and regimen 06/07/2014   Cocaine dependence, uncomplicated ([place]) 09/62/8366   Chronic pain 05/31/2014   Cocaine abuse ([place]) 05/31/2014   History of noncompliance with medical treatment 05/31/2014   Malingering 05/31/2014   Leukopenia 05/31/2014   Diabetic neuropathy associated with diabetes mellitus due to underlying condition ([place]) 05/31/2014   Diabetic neuropathy ([place]) 05/31/2014   Depression, major, recurrent, severe with psychosis ([place]) 08/29/2013   Suicidal ideations 08/28/2013   Type 2 diabetes mellitus ([place]) 06/30/2013   Atypical chest pain 12/09/2011   Hypertension 12/09/2011   Episodic mood disorder ([place]) 05/13/2011   PCP:  Jodi Marble, MD Pharmacy:   [place], [place] - [address] [place] [address] Phone: [phone] Fax: [phone]     Social Determinants of Health (SDOH) Interventions    Readmission Risk Interventions    01/12/2020    3:39 PM  Readmission Risk Prevention Plan  Transportation Screening Complete  Palliative Care Screening Not Applicable

## 2021-10-31 DIAGNOSIS — N1831 Chronic kidney disease, stage 3a: Secondary | ICD-10-CM | POA: Diagnosis not present

## 2021-10-31 DIAGNOSIS — N179 Acute kidney failure, unspecified: Secondary | ICD-10-CM | POA: Diagnosis not present

## 2021-10-31 DIAGNOSIS — J189 Pneumonia, unspecified organism: Secondary | ICD-10-CM | POA: Diagnosis not present

## 2021-10-31 DIAGNOSIS — I5033 Acute on chronic diastolic (congestive) heart failure: Secondary | ICD-10-CM | POA: Diagnosis not present

## 2021-10-31 LAB — CBC
HCT: 25.5 % — ABNORMAL LOW (ref 39.0–52.0)
Hemoglobin: 7.8 g/dL — ABNORMAL LOW (ref 13.0–17.0)
MCH: 25.3 pg — ABNORMAL LOW (ref 26.0–34.0)
MCHC: 30.6 g/dL (ref 30.0–36.0)
MCV: 82.8 fL (ref 80.0–100.0)
Platelets: 246 10*3/uL (ref 150–400)
RBC: 3.08 MIL/uL — ABNORMAL LOW (ref 4.22–5.81)
RDW: 18.9 % — ABNORMAL HIGH (ref 11.5–15.5)
WBC: 5.3 10*3/uL (ref 4.0–10.5)
nRBC: 0 % (ref 0.0–0.2)

## 2021-10-31 LAB — GLUCOSE, CAPILLARY
Glucose-Capillary: 181 mg/dL — ABNORMAL HIGH (ref 70–99)
Glucose-Capillary: 204 mg/dL — ABNORMAL HIGH (ref 70–99)
Glucose-Capillary: 179 mg/dL — ABNORMAL HIGH (ref 70–99)
Glucose-Capillary: 169 mg/dL — ABNORMAL HIGH (ref 70–99)

## 2021-10-31 LAB — RENAL FUNCTION PANEL
Albumin: 2 g/dL — ABNORMAL LOW (ref 3.5–5.0)
Anion gap: 11 (ref 5–15)
BUN: 53 mg/dL — ABNORMAL HIGH (ref 6–20)
CO2: 21 mmol/L — ABNORMAL LOW (ref 22–32)
Calcium: 8.2 mg/dL — ABNORMAL LOW (ref 8.9–10.3)
Chloride: 106 mmol/L (ref 98–111)
Creatinine, Ser: 3.02 mg/dL — ABNORMAL HIGH (ref 0.61–1.24)
GFR, Estimated: 23 mL/min — ABNORMAL LOW (ref >60)
Glucose, Bld: 167 mg/dL — ABNORMAL HIGH (ref 70–99)
Phosphorus: 4.5 mg/dL (ref 2.5–4.6)
Potassium: 3.3 mmol/L — ABNORMAL LOW (ref 3.5–5.1)
Sodium: 138 mmol/L (ref 135–145)

## 2021-10-31 LAB — MAGNESIUM: Magnesium: 1.6 mg/dL — ABNORMAL LOW (ref 1.7–2.4)

## 2021-10-31 MED ORDER — FUROSEMIDE 80 MG PO TABS: 80.0000 mg | ORAL_TABLET | Freq: Two times a day (BID) | ORAL | Status: DC

## 2021-10-31 MED ORDER — RENA-VITE PO TABS: 1.0000 | ORAL_TABLET | Freq: Every day | ORAL | 0 refills | Status: DC

## 2021-10-31 MED ORDER — PREGABALIN 75 MG PO CAPS: 75.0000 mg | ORAL_CAPSULE | Freq: Every day | ORAL | Status: DC

## 2021-10-31 MED ORDER — NEPRO/CARBSTEADY PO LIQD: 237.0000 mL | Freq: Every day | ORAL | 0 refills | Status: DC

## 2021-10-31 MED ORDER — CALCIUM ACETATE (PHOS BINDER) 667 MG PO CAPS: 667.0000 mg | ORAL_CAPSULE | Freq: Three times a day (TID) | ORAL | Status: DC

## 2021-10-31 MED ORDER — PANTOPRAZOLE SODIUM 40 MG PO TBEC: 40.0000 mg | DELAYED_RELEASE_TABLET | Freq: Every day | ORAL | Status: DC

## 2021-10-31 MED ORDER — SODIUM BICARBONATE 650 MG PO TABS: 1300.0000 mg | ORAL_TABLET | Freq: Two times a day (BID) | ORAL | Status: DC

## 2021-10-31 NOTE — NC FL2 (Signed)
[place] LEVEL OF CARE SCREENING TOOL     IDENTIFICATION  Patient Name: [name] Birthdate: [birth year] Sex: male Admission Date (Current Location): 10/19/2021  [place] and [place] Number:  Kathleen Argue [id number] [place] and Address:  [facility], [place] [address] [place], [place] [address]      Provider Number: [id number]  Attending Physician Name and Address:  Leeanne Rio, MD  Relative Name and Phone Number:  Leib, Elahi (Brother)   [phone]    Current Level of Care: Hospital Recommended Level of Care: [place] Prior Approval Number:    Date Approved/Denied:   PASRR Number: [id number]  Discharge Plan: SNF    Current Diagnoses: Patient Active Problem List   Diagnosis Date Noted   Swelling of arm 76/72/0947   Metabolic acidosis 09/62/8366   Community acquired pneumonia    Multiple thyroid nodules 10/19/2021   (HFpEF) heart failure with preserved ejection fraction (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 08/22/2020   Pericardial effusion 08/22/2020   Acute on chronic diastolic CHF (congestive heart failure) ([place]) 08/22/2020   TIA (transient ischemic attack) 07/03/2020   Acute on chronic heart failure with preserved ejection fraction ([place])    Pulmonary artery hypertension ([place]) 06/25/2020   History of cerebrovascular accident (CVA) with residual deficit 06/25/2020   CKD stage IIIa ([place]) 03/30/2020   Morbid (severe) obesity due to excess  calories ([place]) 03/30/2020   Malnutrition of moderate degree 01/25/2020   Palliative care by specialist    Goals of care, counseling/discussion    Nephrotic syndrome 01/06/2020   Anasarca 01/06/2020   Hypokalemia due to loss of potassium 01/04/2020   Generalized body aches 01/01/2020   Non-traumatic rhabdomyolysis 01/01/2020   Chronic diastolic CHF (congestive heart failure) ([place]) 01/01/2020   Iron deficiency anemia 01/01/2020   Hypokalemia 12/31/2019   Elevated CK    Uncontrolled type 2 diabetes mellitus with hyperglycemia ([place])    Pure hypercholesterolemia    Hyperglycemia 09/28/2017   Nonobstructive atherosclerosis of coronary artery 09/28/2017   MDD (major depressive disorder), recurrent episode, severe ([place]) 07/30/2017   Osteoarthritis 04/12/2015   Severe recurrent major depression with psychotic features ([place]) 01/04/2015   Substance-related disorder ([place]) 10/30/2014   Patient's noncompliance with other medical treatment and regimen 06/07/2014   Cocaine dependence, uncomplicated ([place]) 29/47/6546   Chronic pain 05/31/2014   Cocaine abuse ([place]) 05/31/2014   History of noncompliance with medical treatment 05/31/2014   Malingering 05/31/2014   Leukopenia 05/31/2014   Diabetic neuropathy associated with diabetes mellitus due to underlying condition ([place]) 05/31/2014   Diabetic neuropathy ([place]) 05/31/2014   Depression, major, recurrent, severe with psychosis ([employer]) 08/29/2013   Suicidal ideations 08/28/2013   Type 2 diabetes mellitus ([place]) 06/30/2013   Atypical chest pain 12/09/2011   Hypertension 12/09/2011   Episodic mood disorder ([place]) 05/13/2011    Orientation RESPIRATION BLADDER Height & Weight     Place, Situation, Time, Self  Normal Incontinent Weight: 271 lb 2.7 oz (123 kg) Height:  '5\' 8"'$  (172.7 cm)  BEHAVIORAL SYMPTOMS/MOOD NEUROLOGICAL BOWEL  NUTRITION STATUS      Incontinent Diet (see d/c summary)  AMBULATORY STATUS COMMUNICATION OF NEEDS Skin   Total Care Verbally  Normal                       Personal Care Assistance Level of Assistance  Bathing, Dressing, Total care, Feeding Bathing Assistance: Maximum assistance Feeding assistance: Maximum assistance Dressing Assistance: Maximum assistance Total Care Assistance: Maximum assistance   Functional Limitations Info  Sight, Hearing, Speech Sight Info: Impaired Hearing Info: Adequate Speech Info: Adequate    SPECIAL CARE FACTORS FREQUENCY                       Contractures Contractures Info: Not present    Additional Factors Info  Code Status, Allergies, Psychotropic Code Status Info: Full Allergies Info: Grits Psychotropic Info: zoloft         Current Medications (10/31/2021):  This is the current hospital active medication list Current Facility-Administered Medications  Medication Dose Route Frequency Provider Last Rate Last Admin   amLODipine (NORVASC) tablet 10 mg  10 mg Oral Daily Jim Like B, MD   10 mg at 10/31/21 1010   ascorbic acid (VITAMIN C) tablet 500 mg  500 mg Oral BID Jim Like B, MD   500 mg at 10/31/21 1011   aspirin chewable tablet 81 mg  81 mg Oral Daily Jim Like B, MD   81 mg at 10/31/21 1011   atorvastatin (LIPITOR) tablet 80 mg  80 mg Oral QHS Jim Like B, MD   80 mg at 10/30/21 2228   brimonidine (ALPHAGAN) 0.2 % ophthalmic solution 1 drop  1 drop Both Eyes BID Jim Like B, MD   1 drop at 10/31/21 1011   calcium acetate (PHOSLO) capsule 667 mg  667 mg Oral TID WC Espinoza, Alejandra, DO   667 mg at 10/31/21 6073   Chlorhexidine Gluconate Cloth 2 % PADS 6 each  6 each Topical Daily Leeanne Rio, MD   6 each at 10/31/21 1012   Darbepoetin Alfa (ARANESP) injection 100 mcg  100 mcg Subcutaneous Q Tue-1800 Harrie Jeans C, MD   100 mcg at 10/30/21 1806   dorzolamide-timolol (COSOPT) 22.3-6.8 MG/ML ophthalmic solution 1 drop  1 drop Both Eyes BID Jim Like B, MD   1 drop at 10/31/21 1012   feeding supplement (NEPRO CARB  STEADY) liquid 237 mL  237 mL Oral QHS Leeanne Rio, MD   237 mL at 10/29/21 2256   furosemide (LASIX) tablet 80 mg  80 mg Oral BID Rosita Fire, MD   80 mg at 10/31/21 7106   heparin injection 5,000 Units  5,000 Units Subcutaneous Q8H Espinoza, Alejandra, DO   5,000 Units at 10/31/21 0542   latanoprost (XALATAN) 0.005 % ophthalmic solution 1 drop  1 drop Both Eyes QHS Eppie Gibson, MD   1 drop at 10/30/21 2229   multivitamin (RENA-VIT) tablet 1 tablet  1 tablet Oral QHS Leeanne Rio, MD   1 tablet at 10/30/21 2228   Oral care mouth rinse  15 mL Mouth Rinse PRN Leeanne Rio, MD       pantoprazole (PROTONIX) EC tablet 40 mg  40 mg Oral Daily Leeanne Rio, MD   40 mg at 10/31/21 1011   pregabalin (LYRICA) capsule 75 mg  75 mg Oral Daily Espinoza, Alejandra, DO   75 mg at 10/31/21 1011   sertraline (ZOLOFT) tablet 50 mg  50 mg Oral QHS Jim Like B, MD   50 mg at 10/25/21 2106   sodium bicarbonate tablet 1,300 mg  1,300 mg Oral BID Rosita Fire, MD   1,300 mg at 10/31/21 1010   tamsulosin (FLOMAX) capsule 0.8 mg  0.8 mg Oral Daily Eppie Gibson, MD   0.8 mg at 10/31/21 1011     Discharge Medications: Please see discharge summary for a list of discharge medications.  Relevant Imaging Results:  Relevant Lab Results:   Additional Information SSN [id number]  Milinda Antis, LCSWA

## 2021-10-31 NOTE — Progress Notes (Signed)
[facility] Progress Note  Name: SAMY RYNER MRN: [id number] DOB: [birth year]   Subjective:  He had 2.2 L uop over 9/5 charted.  His foley was removed on 9/5.  He's been resting today.  He was moved to a room with a larger bed.  He and RN state that plans are still for discharge today to SNF and his d/c summary is in.   Review of systems:   Denies n/v  Denies shortness of breath  Denies chest pain    Intake/Output Summary (Last 24 hours) at 10/31/2021 1207 Last data filed at 10/31/2021 1000 Gross per 24 hour  Intake 240 ml  Output 2250 ml  Net -2010 ml    Vitals:  Vitals:   10/30/21 1637 10/30/21 2100 10/31/21 0500 10/31/21 0523  BP: (!) 140/82 (!) 154/92  (!) 140/76  Pulse: 74 77  74  Resp: '18 19  18  '$ Temp: 98.2 F (36.8 C) 98.6 F (37 C)  98.3 F (36.8 C)  TempSrc:      SpO2: 99% 100%  95%  Weight:   123 kg   Height:         Physical Exam:     General adult male in bed in no acute distress HEENT normocephalic atraumatic extraocular movements intact sclera anicteric Neck supple trachea midline Lungs clear to auscultation bilaterally normal work of breathing at rest  Heart S1S2 no rub Abdomen soft nontender nondistended Extremities 1-2+ edema right leg and left BKA Psych normal mood and affect GU foley is out   Medications reviewed   Labs:     Latest Ref Rng & Units 10/30/2021    2:20 AM 10/29/2021    2:51 AM 10/28/2021    6:59 AM  BMP  Glucose 70 - 99 mg/dL 185  130  146   BUN 6 - 20 mg/dL 49  48  50   Creatinine 0.61 - 1.24 mg/dL 3.14  3.33  3.45   Sodium 135 - 145 mmol/L 139  139  138   Potassium 3.5 - 5.1 mmol/L 3.3  3.0  3.2   Chloride 98 - 111 mmol/L 105  109  111   CO2 22 - 32 mmol/L '22  20  19   '$ Calcium 8.9 - 10.3 mg/dL 8.2  8.1  7.8      Assessment/Plan:   Pt is a 61 y.o. yo male  with past medical history of hypertension, diabetes, TIA, DVT, peripheral vascular disease with left BKA, bipolar mood disorder, CHF who was admitted on  8/25 for shortness of breath and fluid overload seen as a consultation for the management of acute kidney injury.    #Acute kidney injury on CKD stage III: Multifactorial etiology including IV contrast on 8/25, CHF exacerbation/PNA and severe anemia and +bladder outlet obstruction.  UA with chronic proteinuria without RBC. Bladder scan with acute urinary retention requiring intermittent catheterization.  Kidney ultrasound unremarkable.  Initially treated with IV Lasix, metolazone and albumin with good response.   - continue lasix at current regimen - lasix 80 mg PO BID   - Continues to have good urine output and cr improving on last check   # Urinary retention - has received in/out cath previously and then foley was placed.  Now removed and bladder scans acceptable (not sure when they were relative to voiding - multiple scans appear to have been done). Last scan was 100 mL and nadir scan was 40 mL   #Acute on chronic diastolic CHF:  Echo with grade 2 diastolic dysfunction.  breathing better; some peripheral edema which seems to be chronic.  Continue diuretics as above - need daily weights while here   #Metabolic acidosis: Continue sodium bicarbonate.   #Hypokalemia due to diuretics: repleted.  Would discharge on potassium 20 meq PO BID.  Would repeat a BMP and magnesium at the SNF with results to SNF physician within one week    #Acute hypoxic respiratory failure due to HCAP and CHF exacerbation: Completed antibiotics for pneumonia, diuretics as above. Respiratory status much improved per charting    #Iron deficiency anemia: Iron saturation is only 5%, s/p IV iron x 4 doses.  Received Aranesp 60 mcg on 8/29 - redose at 100 mcg every Tuesday for now    # HTN: continue current regimen    #Right upper extremity swelling, localized, ultrasound with no DVT.  No sign of infection.  Disposition - disposition per primary team.  I will set up nephrology follow-up for two weeks from now     Claudia Desanctis, MD 10/31/2021 12:20 PM

## 2021-10-31 NOTE — Discharge Summary (Signed)
[facility] Discharge Summary  Patient name: [name] Medical record number: [id number] Date of birth: August 23, 1960 Age: 61 y.o. Gender: male Date of Admission: 10/19/2021  Date of Discharge: 10/31/21 Admitting Physician: Roy Gibson, MD  Primary Care Provider: Jodi Marble, MD Consultants: nephrology, palliative   Indication for Hospitalization: acute hypoxic respiratory failure   Discharge Diagnoses/Problem List:  Principal Problem:   AKI (acute kidney injury) [facility]) Active Problems:   Hypokalemia   Iron deficiency anemia   Hypertension   Type 2 diabetes mellitus ([place])   (HFpEF) heart failure with preserved ejection fraction (HCC)   Metabolic acidosis   Swelling of arm   MDD (major depressive disorder), recurrent episode, severe (HCC)   Diabetic neuropathy (HCC)   BPH (benign prostatic hyperplasia)   Pressure injury of skin   PAD (peripheral artery disease) ([place])   Multiple thyroid nodules   Community acquired pneumonia    Disposition: SNF  Discharge Condition: Stable   Discharge Exam:  General: NAD, alert and conversant  CV: 3/6 systolic continuous murmur. S1/S2 present, RRR, no rubs or gallops appreciated Respiratory: CTAB, intermittent transmitted upper airway sounds   Brief Hospital Course:   [name] is a 61 y.o. 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 His hospital course is detailed below:   Acute hypoxic respiratory failure, secondary to pneumonia and heart failure Patient presented in sepsis with pulmonary source, found to have pneumonia on CXR and chest CT angio. Blood cultures were collected and had no growth. Patient completed a day course of azithromycin and 7 day  course of ceftriaxone. . Patient was also noted to have fluid overload on admission. Echo 10/21/21 revealed EF of 60-65% and Grade 2 diastolic dysfunction with moderate pulmonary HTN and small pericardial effusion. Patient required aggressive diuresis, which was managed by nephrology with Lasix and metolazone. Patient was able to transition to oral diuresis and was monitored with significant improvement in symptoms.    AKI (acute kidney injury) [facility]) Patient presented with AKI, likely from fluid overload. Patient was aggressively diuresed with IV lasix with assistance from nephrology given tenuous kidney status. Patient was not requiring dialysis while here but did require IV albumin and was placed on phosphate binder and sodium bicarb. Renal US was unremarkable. Patient required a foley due to urinary retention. Patient transitioned to oral lasix BID, which was well tolerated. Patient was able to pass a void trial without further retention prior to discharge.   Iron deficiency anemia Labs significant for iron deficiency anemia. Patient required 1 unit of PRBC. Patient received Aranesp injection and was given 4 doses of IV iron. Given concern for GI origin, patient was also started on protonix. No further transfusions were required during hospitalization. Hemoglobin stable upon discharge  Type 2 Diabetes Patient's home insulin was discontinued upon admission as his sugars were overall well controlled. CBGs were monitored and overall well controlled.   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  Diabetes: Glucose remained between 120-180 and did not require insulin. Lyrica for diabetic neuropathy HTN: amlodipine  PAD: atorvastatin BPH: flomax  MDD: zoloft    Items for PCP Follow-Up Recommend outpatient GI follow-up  for EGD and/or Colonoscopy given iron deficiency anemia and high  likelihood of chronic GI bleed.  Abnormal finding on CT chest angio showing multiple thyroid nodules. Needs outpatient thyroid ultrasound. Recommend wound care for chronic sacral pressure ulcer. Palliative consult [place] discussion baseline disability and multiple acute processes. Patient was not ready for hospice and is uncertain on wishes for HD. Agreeable to outpatient palliative care referral.  Monitor CBGs as patient's home insulin was discontinued and not required during hospitalization.  Repeat BMP in 1 day to monitor potassium and creatinine, patient restarted on home potassium BID but may need adjustments  Significant Procedures: None  Significant Labs and Imaging:  Recent Labs  Lab 10/27/21 0819 10/28/21 0659 10/29/21 0251  WBC 4.9 4.8 5.2  HGB 7.9* 7.7* 8.3*  HCT 25.3* 24.3* 26.2*  PLT 229 219 256   Recent Labs  Lab 10/25/21 0451 10/26/21 0905 10/27/21 0819 10/28/21 0659 10/29/21 0251 10/30/21 0220  NA 139 138 139 138 139 139  K 3.8 3.5 3.5 3.2* 3.0* 3.3*  CL 113* 112* 112* 111 109 105  CO2 18* 18* 21* 19* 20* 22  GLUCOSE 138* 136* 130* 146* 130* 185*  BUN 47* 48* 49* 50* 48* 49*  CREATININE 3.74* 3.79* 3.69* 3.45* 3.33* 3.14*  CALCIUM 7.7* 7.8* 7.9* 7.8* 8.1* 8.2*  MG 1.9  --   --   --  1.7  --   PHOS 6.0* 5.9* 5.8* 5.6* 5.5* 4.7*  ALBUMIN 2.0* 2.1* 2.0* 1.9* 2.0* 2.0*     Results/Tests Pending at Time of Discharge:   VAS [place] UPPER EXTREMITY VENOUS DUPLEX: Right: No evidence of deep vein thrombosis in the upper extremity. No evidence of superficial vein thrombosis in the upper extremity. Significant subcutaneous edema noted throughout the right upper extremity.  Left: No evidence of deep vein thrombosis in the upper extremity. No evidence of superficial vein thrombosis in the upper extremity.   US RENAL: No acute sonographic abnormality of the kidneys.   ECHOCARDIOGRAM COMPLETE:EF 60-65%, G2DD, mod elevated PA pressure. Small pericardial effusion.   CT Angio  Chest PE W and/or Wo Contrast:1. Motion artifacts at both lung bases with no definite pulmonary emboli seen. 2. Cardiomegaly and changes of acute congestive heart failure. 3. Extensive dense airspace opacity in both lungs, predominantly in a dependent distribution with a small right pleural effusion and minimal left pleural effusion. Differential considerations include dense alveolar edema and pleural effusions associated with acute CHF and extensive bilateral pneumonia with associated pleural effusions. 4. Multiple thyroid nodules measuring up to 2.1 cm in maximum diameter each, with progression.   Chest XR: Patchy BILATERAL pulmonary infiltrates which could reflect pneumonia, pulmonary edema or alveolar hemorrhage. Small RIGHT pleural effusion.    Discharge Medications:  Allergies as of 10/31/2021       Reactions   Other Anaphylaxis, Itching, Swelling, Other (See Comments)   Grits - swelling        Medication List     STOP taking these medications    Basaglar KwikPen 100 UNIT/ML   dorzolamide 2 % ophthalmic solution Commonly known as: TRUSOPT   insulin lispro 100 UNIT/ML KwikPen Commonly known as: HUMALOG   oxyCODONE 5 MG immediate release tablet Commonly known as: Oxy IR/ROXICODONE       TAKE these medications    acetaminophen 650 MG CR tablet Commonly known as: TYLENOL Take 650 mg by mouth every 4 (four) hours as needed (general discomfort).   albuterol 108 (90 Base) MCG/ACT inhaler Commonly known as: VENTOLIN  HFA Inhale 2 puffs into the lungs every 6 (six) hours as needed for wheezing or shortness of breath.   amLODipine 10 MG tablet Commonly known as: NORVASC Take 0.5 tablets (5 mg total) by mouth daily. What changed:  how much to take when to take this   ascorbic acid 500 MG tablet Commonly known as: VITAMIN C Take 500 mg by mouth 2 (two) times daily.   aspirin 81 MG chewable tablet Chew 81 mg by mouth daily.   atorvastatin 80 MG tablet Commonly known  as: LIPITOR Take 80 mg by mouth at bedtime.   brimonidine 0.2 % ophthalmic solution Commonly known as: ALPHAGAN Place 1 drop into both eyes 2 (two) times daily. What changed: Another medication with the same name was removed. Continue taking this medication, and follow the directions you see here.   calcium acetate 667 MG capsule Commonly known as: PHOSLO Take 1 capsule (667 mg total) by mouth 3 (three) times daily with meals.   docusate sodium 100 MG capsule Commonly known as: COLACE Take 100 mg by mouth at bedtime.   dorzolamide-timolol 22.3-6.8 MG/ML ophthalmic solution Commonly known as: COSOPT Place 1 drop into both eyes 2 (two) times daily.   feeding supplement (NEPRO CARB STEADY) Liqd Take 237 mLs by mouth at bedtime.   ferrous sulfate 325 (65 FE) MG tablet Take 1 tablet (325 mg total) by mouth daily.   furosemide 80 MG tablet Commonly known as: LASIX Take 1 tablet (80 mg total) by mouth 2 (two) times daily. What changed:  medication strength how much to take when to take this   latanoprost 0.005 % ophthalmic solution Commonly known as: XALATAN Place 1 drop into both eyes at bedtime.   multivitamin tablet Take 1 tablet by mouth daily.   multivitamin Tabs tablet Take 1 tablet by mouth at bedtime.   ondansetron 4 MG tablet Commonly known as: ZOFRAN Take 4 mg by mouth every 4 (four) hours as needed for nausea or vomiting.   pantoprazole 40 MG tablet Commonly known as: PROTONIX Take 1 tablet (40 mg total) by mouth daily.   potassium chloride SA 20 MEQ tablet Commonly known as: KLOR-CON M Take 20 mEq by mouth 2 (two) times daily.   pregabalin 75 MG capsule Commonly known as: LYRICA Take 1 capsule (75 mg total) by mouth daily. What changed: when to take this   PROTEIN PO Take 30 mLs by mouth 2 (two) times daily.   saccharomyces boulardii 250 MG capsule Commonly known as: FLORASTOR Take 1 capsule (250 mg total) by mouth 2 (two) times daily.    sertraline 50 MG tablet Commonly known as: ZOLOFT Take 50 mg by mouth every evening.   sodium bicarbonate 650 MG tablet Take 2 tablets (1,300 mg total) by mouth 2 (two) times daily.   tamsulosin 0.4 MG Caps capsule Commonly known as: FLOMAX Take 0.8 mg by mouth every evening.   Vitamin D (Ergocalciferol) 1.25 MG (50000 UNIT) Caps capsule Commonly known as: DRISDOL Take 50,000 Units by mouth every 30 (thirty) days. 17th of each month   zinc sulfate 220 (50 Zn) MG capsule Take 220 mg by mouth daily.        Discharge Instructions: Please refer to Patient Instructions section of EMR for full details.  Patient was counseled important signs and symptoms that should prompt return to medical care, changes in medications, dietary instructions, activity restrictions, and follow up appointments.   Follow-Up Appointments:  Contact information for after-discharge care  Destination     [facility] .   Service: Skilled Nursing Contact information: [address] [address] [place], [place], DO 10/31/2021, 11:57 AM PGY-3, [facility]

## 2021-10-31 NOTE — TOC Progression Note (Addendum)
Transition of Care [facility]) - Initial/Assessment Note    Patient Details  Name: [name] MRN: [id number] Date of Birth: [birth year]  Transition of Care [facility] CM/SW Contact:    Milinda Antis, [place] Phone Number: 10/31/2021, 9:10 AM  Clinical Narrative:                 CSW completed chart review and read that patient can discharge back to facility today.  CSW contacted [facility] where patient is a LTC resident and informed admissions pending discharge and is awaiting a response.  10:20-  Facility is requesting FL2 to attempt to complete insurance auth prior to patient return.  Pending insurance auth.  15:20-  CSW contacted facility to inquire about insurance auth.  Insurance Josem Kaufmann is still pending.    16:15-  CSW contacted facility to inquire about the status of insurance auth.  Insurance Josem Kaufmann is still pending   TOC will continue to follow.          Patient Goals and CMS Choice        Expected Discharge Plan and Services                                                Prior Living Arrangements/Services                       Activities of Daily Living Home Assistive Devices/Equipment: None ADL Screening (condition at time of admission) Patient's cognitive ability adequate to safely complete daily activities?: Yes Is the patient deaf or have difficulty hearing?: No Does the patient have difficulty seeing, even when wearing glasses/contacts?: No Does the patient have difficulty concentrating, remembering, or making decisions?: Yes Patient able to express need for assistance with ADLs?: Yes Does the patient have difficulty dressing or bathing?: Yes Independently performs ADLs?: No Communication: Independent Dressing (OT): Needs assistance Is this a change from baseline?: Pre-admission baseline Grooming: Needs assistance Is this a change from baseline?: Pre-admission baseline Feeding: Needs assistance Is this a change from baseline?:  Pre-admission baseline Bathing: Needs assistance Is this a change from baseline?: Pre-admission baseline Toileting: Needs assistance Is this a change from baseline?: Pre-admission baseline In/Out Bed: Needs assistance Is this a change from baseline?: Pre-admission baseline Walks in Home: Needs assistance Is this a change from baseline?: Pre-admission baseline Does the patient have difficulty walking or climbing stairs?: Yes Weakness of Legs: Both Weakness of Arms/Hands: None  Permission Sought/Granted                  Emotional Assessment              Admission diagnosis:  Community acquired pneumonia [J18.9] Hypoxia [R09.02] Hemoptysis [R04.2] Community acquired pneumonia, unspecified laterality [J18.9] Patient Active Problem List   Diagnosis Date Noted   Swelling of arm 14/43/1540   Metabolic acidosis 08/67/6195   Community acquired pneumonia    Multiple thyroid nodules 10/19/2021   (HFpEF) heart failure with preserved ejection fraction ([place]) 10/19/2021   Diabetic foot infection ([place]) 03/01/2021   PAD (peripheral artery disease) ([place]) 03/01/2021   Glaucoma (increased eye pressure) 03/01/2021   Pressure injury of skin 11/19/2020   Ileus ([place]) 11/18/2020   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   Depression 11/18/2020   Acute renal failure superimposed on stage 3a chronic kidney disease ([place]) 11/18/2020   BPH (benign prostatic hyperplasia)  11/18/2020   AKI (acute kidney injury) ([place]) 08/22/2020   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 08/22/2020   Pericardial effusion 08/22/2020   Acute on chronic diastolic CHF (congestive heart failure) ([place]) 08/22/2020   TIA (transient ischemic attack) 07/03/2020   Acute on chronic heart failure with preserved ejection fraction ([place])    Pulmonary artery hypertension ([place]) 06/25/2020   History of cerebrovascular accident (CVA) with residual deficit 06/25/2020   CKD stage IIIa ([place]) 03/30/2020   Morbid (severe) obesity due to  excess calories ([place]) 03/30/2020   Malnutrition of moderate degree 01/25/2020   Palliative care by specialist    Goals of care, counseling/discussion    Nephrotic syndrome 01/06/2020   Anasarca 01/06/2020   Hypokalemia due to loss of potassium 01/04/2020   Generalized body aches 01/01/2020   Non-traumatic rhabdomyolysis 01/01/2020   Chronic diastolic CHF (congestive heart failure) ([place]) 01/01/2020   Iron deficiency anemia 01/01/2020   Hypokalemia 12/31/2019   Elevated CK    Uncontrolled type 2 diabetes mellitus with hyperglycemia ([place])    Pure hypercholesterolemia    Hyperglycemia 09/28/2017   Nonobstructive atherosclerosis of coronary artery 09/28/2017   MDD (major depressive disorder), recurrent episode, severe ([place]) 07/30/2017   Osteoarthritis 04/12/2015   Severe recurrent major depression with psychotic features ([place]) 01/04/2015   Substance-related disorder ([place]) 10/30/2014   Patient's noncompliance with other medical treatment and regimen 06/07/2014   Cocaine dependence, uncomplicated ([place]) 19/62/2297   Chronic pain 05/31/2014   Cocaine abuse ([place]) 05/31/2014   History of noncompliance with medical treatment 05/31/2014   Malingering 05/31/2014   Leukopenia 05/31/2014   Diabetic neuropathy associated with diabetes mellitus due to underlying condition ([place]) 05/31/2014   Diabetic neuropathy ([place]) 05/31/2014   Depression, major, recurrent, severe with psychosis ([place]) 08/29/2013   Suicidal ideations 08/28/2013   Type 2 diabetes mellitus ([place]) 06/30/2013   Atypical chest pain 12/09/2011   Hypertension 12/09/2011   Episodic mood disorder ([place]) 05/13/2011   PCP:  Jodi Marble, MD Pharmacy:   [place], [place] - [address] [place] [address] Phone: [phone] Fax: [phone]     Social Determinants of Health (SDOH) Interventions    Readmission Risk Interventions    01/12/2020    3:39 PM  Readmission Risk  Prevention Plan  Transportation Screening Complete  Palliative Care Screening Not Applicable

## 2021-10-31 NOTE — Plan of Care (Signed)
?  Problem: Clinical Measurements: ?Goal: Will remain free from infection ?Outcome: Progressing ?  ?Problem: Clinical Measurements: ?Goal: Diagnostic test results will improve ?Outcome: Progressing ?  ?

## 2021-10-31 NOTE — Progress Notes (Signed)
Daily Progress Note   Patient Name: [name]       Date: 10/31/2021 DOB: 1960-12-09  Age: 62 y.o. MRN#: [id number] Attending Physician: Leeanne Rio, MD Primary Care Physician: Jodi Marble, MD Admit Date: 10/19/2021  Reason for Consultation/Follow-up: Establishing goals of care  Subjective: 1:42 PM Received notification patient's brother/[name] and SIL/Tejuana called PMT phone with request to return call.  Chart review performed - plan for discharge today. Spoke with primary RN - no acute concerns.   Went to visit patient at bedside - no family/visitors present. Patient was lying in bed awake, alert, oriented, and able to participate in conversation. He expresses concern about his discharge today due to his right arm weakness/still being swollen. He does tell me that swelling has improved. Reviewed discharge plan to continue lasix for fluid management. He expresses understanding and is glad to hear this. He also is hopeful to continue physical therapies at facility for his legs. He is still agreeable to outpatient Palliative Care to follow. Emotional support provided - all questions answered.  2:21 PM Returned [name]'s call. Provided updates per his request. He specifically asks about creatinine, GFR, and updates from nephrology. Reviewed and education provided on labs/trends and that, at this time, dialysis was not indicated due to decreasing creatinine. Questions about patient's CHF were answered. Reviewed the challenge of managing CKD and CHF in context of each other. Notified him that patient was getting discharged back to [facility] today. [name] expressed appreciation for updates and information.  All questions and concerns addressed. Encouraged to call with questions and/or  concerns. PMT card previously provided.  Length of Stay: 12  Current Medications: Scheduled Meds:   amLODipine  10 mg Oral Daily   ascorbic acid  500 mg Oral BID   aspirin  81 mg Oral Daily   atorvastatin  80 mg Oral QHS   brimonidine  1 drop Both Eyes BID   calcium acetate  667 mg Oral TID WC   Chlorhexidine Gluconate Cloth  6 each Topical Daily   darbepoetin (ARANESP) injection - NON-DIALYSIS  100 mcg Subcutaneous Q Tue-1800   dorzolamide-timolol  1 drop Both Eyes BID   feeding supplement (NEPRO CARB STEADY)  237 mL Oral QHS   furosemide  80 mg Oral BID   heparin injection (subcutaneous)  5,000 Units  Subcutaneous Q8H   latanoprost  1 drop Both Eyes QHS   multivitamin  1 tablet Oral QHS   pantoprazole  40 mg Oral Daily   pregabalin  75 mg Oral Daily   sertraline  50 mg Oral QHS   sodium bicarbonate  1,300 mg Oral BID   tamsulosin  0.8 mg Oral Daily    Continuous Infusions:   PRN Meds: mouth rinse  Physical Exam Vitals and nursing note reviewed.  Constitutional:      General: He is not in acute distress. Pulmonary:     Effort: No respiratory distress.  Skin:    General: Skin is warm and dry.  Neurological:     Mental Status: He is alert and oriented to person, place, and time.     Motor: Weakness present.  Psychiatric:        Attention and Perception: Attention normal.        Behavior: Behavior is cooperative.        Cognition and Memory: Cognition and memory normal.             Vital Signs: BP (!) 142/74 (BP Location: Left Arm)   Pulse 76   Temp 98.1 F (36.7 C)   Resp 18   Ht '5\' 8"'$  (1.727 m)   Wt 123 kg   SpO2 95%   BMI 41.23 kg/m  SpO2: SpO2: 95 % O2 Device: O2 Device: Room Air O2 Flow Rate: O2 Flow Rate (L/min): 2 L/min  Intake/output summary:  Intake/Output Summary (Last 24 hours) at 10/31/2021 1451 Last data filed at 10/31/2021 1300 Gross per 24 hour  Intake 930 ml  Output 2250 ml  Net -1320 ml   LBM: Last BM Date : 10/31/21 Baseline Weight:  Weight: 119.2 kg Most recent weight: Weight: 123 kg       Palliative Assessment/Data: PPS 40%      Patient Active Problem List   Diagnosis Date Noted   Swelling of arm 15/40/0867   Metabolic acidosis 61/95/0932   Community acquired pneumonia    Multiple thyroid nodules 10/19/2021   (HFpEF) heart failure with preserved ejection fraction (HCC) 10/19/2021   Diabetic foot infection ([place]) 03/01/2021   PAD (peripheral artery disease) ([place]) 03/01/2021   Glaucoma (increased eye pressure) 03/01/2021   Pressure injury of skin 11/19/2020   Ileus ([place]) 11/18/2020   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   Depression 11/18/2020   Acute renal failure superimposed on stage 3a chronic kidney disease ([place]) 11/18/2020   BPH (benign prostatic hyperplasia) 11/18/2020   AKI (acute kidney injury) ([place]) 08/22/2020   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 08/22/2020   Pericardial effusion 08/22/2020   Acute on chronic diastolic CHF (congestive heart failure) ([place]) 08/22/2020   TIA (transient ischemic attack) 07/03/2020   Acute on chronic heart failure with preserved ejection fraction (HCC)    Pulmonary artery hypertension ([place]) 06/25/2020   History of cerebrovascular accident (CVA) with residual deficit 06/25/2020   CKD stage IIIa ([place]) 03/30/2020   Morbid (severe) obesity due to excess calories ([place]) 03/30/2020   Malnutrition of moderate degree 01/25/2020   Palliative care by specialist    Goals of care, counseling/discussion    Nephrotic syndrome 01/06/2020   Anasarca 01/06/2020   Hypokalemia due to loss of potassium 01/04/2020   Generalized body aches 01/01/2020   Non-traumatic rhabdomyolysis 01/01/2020   Chronic diastolic CHF (congestive heart failure) ([place]) 01/01/2020   Iron deficiency anemia 01/01/2020   Hypokalemia 12/31/2019   Elevated CK    Uncontrolled type 2  diabetes mellitus with hyperglycemia ([place])    Pure hypercholesterolemia    Hyperglycemia 09/28/2017   Nonobstructive  atherosclerosis of coronary artery 09/28/2017   MDD (major depressive disorder), recurrent episode, severe ([place]) 07/30/2017   Osteoarthritis 04/12/2015   Severe recurrent major depression with psychotic features ([place]) 01/04/2015   Substance-related disorder ([place]) 10/30/2014   Patient's noncompliance with other medical treatment and regimen 06/07/2014   Cocaine dependence, uncomplicated ([place]) 16/11/9602   Chronic pain 05/31/2014   Cocaine abuse ([place]) 05/31/2014   History of noncompliance with medical treatment 05/31/2014   Malingering 05/31/2014   Leukopenia 05/31/2014   Diabetic neuropathy associated with diabetes mellitus due to underlying condition ([place]) 05/31/2014   Diabetic neuropathy ([place]) 05/31/2014   Depression, major, recurrent, severe with psychosis ([place]) 08/29/2013   Suicidal ideations 08/28/2013   Type 2 diabetes mellitus ([place]) 06/30/2013   Atypical chest pain 12/09/2011   Hypertension 12/09/2011   Episodic mood disorder ([place]) 05/13/2011    Palliative Care Assessment & Plan   Patient Profile: 61 y.o. male  with past medical history of  CHF, Bipolar, MDD, DM2, HTN, TIA, hx of DVT, CKD3, and left BKA admitted on 10/19/2021 with fever, chills, worsening dyspnea.    Patient recently admitted for PNA. Now admitted for acute hypoxic respiratory failure due to PNA and CHF exacerbation, as well as AKI. PMT has been consulted to assist with goals of care conversation.  Assessment: Principal Problem:   AKI (acute kidney injury) ([place]) Active Problems:   Hypertension   Type 2 diabetes mellitus ([place])   MDD (major depressive disorder), recurrent episode, severe (HCC)   Diabetic neuropathy (HCC)   Hypokalemia   Iron deficiency anemia   BPH (benign prostatic hyperplasia)   Pressure injury of skin   PAD (peripheral artery disease) (HCC)   Multiple thyroid nodules   (HFpEF) heart failure with preserved ejection fraction ([place])   Community acquired pneumonia   Metabolic acidosis    Swelling of arm   Recommendations/Plan: Continue full code/full scope Discharge back to [place] with outpatient Palliative Care to follow PMT will continue to follow peripherally. If there are any imminent needs please call the service directly  Goals of Care and Additional Recommendations: Limitations on Scope of Treatment: Full Scope Treatment  Code Status:    Code Status Orders  (From admission, onward)           Start     Ordered   10/19/21 1555  Full code  Continuous        10/19/21 1602           Code Status History     Date Active Date Inactive Code Status Order ID Comments User Context   03/01/2021 1507 03/13/2021 0408 Full Code [id number]  Karmen Bongo, MD Inpatient   11/18/2020 2041 12/12/2020 0309 Full Code [id number]  Orene Desanctis, DO ED   08/22/2020 0531 09/22/2020 1954 Full Code [id number]  Etta Quill, DO ED   06/30/2020 1243 07/05/2020 2345 Full Code [id number]  Geradine Girt, DO ED   06/25/2020 [place] 06/28/2020 2024 Full Code [id number]  Norval Morton, MD ED   01/17/2020 1708 01/31/2020 1753 DNR [id number]  Rosezella Rumpf, NP Inpatient   12/31/2019 2311 01/17/2020 1708 Full Code [id number]  [name], MD ED   12/31/2017 1739 01/02/2018 1315 Full Code [id number]  [name], [name], MD Inpatient   09/28/2017 1434 10/01/2017 1948 Full Code [id number]  [name], [place], DO ED   07/30/2017 2111 08/04/2017 1746 Full  Code [id number]  [name], NP Inpatient   07/29/2017 2347 07/30/2017 2033 Full Code [id number]  [name] ED   01/08/2015 2013 01/09/2015 2017 Full Code [id number]  [name], MD Inpatient   01/04/2015 1915 01/08/2015 2013 Full Code [id number]  [name], MD Inpatient   12/01/2014 1735 12/02/2014 1242 Full Code [id number]  [name], MD ED   08/28/2013 1748 08/31/2013 1627 Full Code [id number]  [name], NP Inpatient   08/28/2013 1301 08/28/2013 1748 Full Code [id number]  [name], MD ED   05/10/2011 2141  05/12/2011 1747 Full Code [id number]  [name], PA-C ED       Prognosis:  Long term poor  Discharge Planning: [place] for rehab with Palliative care service follow-up  Care plan was discussed with primary RN, patient, patient's brother  Thank you for allowing the Palliative Medicine Team to assist in the care of this patient.   Total Time 35 minutes Prolonged Time Billed  no       Greater than 50%  of this time was spent counseling and coordinating care related to the above assessment and plan.  Lin Landsman, NP  Please contact Palliative Medicine Team phone at [phone] for questions and concerns.   *Portions of this note are a verbal dictation therefore any spelling and/or grammatical errors are due to the "[employer]" system interpretation.

## 2021-10-31 NOTE — Progress Notes (Addendum)
Daily Progress Note Intern Pager: [phone]  Patient name: [name] Medical record number: [id number] Date of birth: 26-Dec-1960 Age: 61 y.o. Gender: male  Primary Care Provider: Jodi Marble, MD Consultants: nephrology, palliative  Code Status: FULL  Pt Overview and Major Events to Date:  10/19/21: Admitted to FPTS  Assessment and Plan: KAMAAL CAST is a 61 y.o. 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 PNA now resolved. Diuresing per nephro recs. Ongoing [place] discussions with palliative - patient would like all measures to prolong life and avoid dialysis.   * AKI (acute kidney injury) ([place]) Acute on chronic kidney disease IIIa. Void trial on 9/5 yielded >2L urine.  - Nephrology following, appreciate care and recommendations  - Continue Lasix 80 mg PO BID - Continue phosphate binder, Phoslo 667 mg  - Continue to trend daily BMP  - Avoid nephrotoxic agents - Strict I/Os and daily weights   - Per palliative, pt is not interested in HD at this time  Iron deficiency anemia Hgb improving s/p iron infusions. - Continue 40 mg Protonix  - Trend daily H&H  - Nutrition consulted - s/p 1uPRBC 8/27. Threshold for transfusion <7  Hypokalemia Patient will need to restart his supplements at SNF. K was 3.3 yesterday and repleted.   Swelling of arm Right sided weakness from prior CVA. Right arm swollen at baseline due to immobility and fluid overload.  - Monitor right arm circumference: 15cm 9/4 (stable)  Metabolic acidosis CO2 stably low.  - Continue sodium bicarbonate tablet 1,300 mg PO BID  (HFpEF) heart failure with preserved ejection fraction ([place]) Echo 10/21/21 revealed EF of 60-65% and Grade 2 diastolic dysfunction with moderate pulmonary  hypertension, moderate aortic stenosis, and small pericardial effusion. Having good UOP. - Strict I/Os and daily weights  - Diurese per nephrology   Type 2 diabetes mellitus (HCC) Stable without insulin.  - CBG checks qAC HS  Multiple thyroid nodules Chest CT angio revealed "multiple thyroid nodules measuring up to 2.1 cm in maximum diameter each, with progression.  -TSH 1.541 WNL  -Recommend follow up outpatient for thyroid ultrasound on d/c  Pressure injury of skin Sacral ulcer present per SNF documentation. Unable to visualize due to inability to turn patient without multiple staff assistance. - Wound care nurse consulted, appreciate care and recs   Chronic problems: Amlodipine for HTN, atorvastatin for PAD, Flomax for BPH, Lyrica for diabetic neuropathy, Zoloft for MDD.  FEN/GI: Regular diet  PPx: Heparin Dispo:SNF today.   Subjective:  Patient was seen at bedside sleeping comfortably. I woke him up and discussed with him his ability to go back to SNF today. He is an agreement. He denies chest pain, SOB, abdominal pain. He had a BM yesterday.   Objective: Temp:  [98 F (36.7 C)-98.6 F (37 C)] 98.3 F (36.8 C) (09/06 0523) Pulse Rate:  [74-77] 74 (09/06 0523) Resp:  [18-19] 18 (09/06 0523) BP: (140-154)/(76-92) 140/76 (09/06 0523) SpO2:  [95 %-100 %] 95 % (09/06 0523) Weight:  [185 kg] 123 kg (09/06 0500) Physical Exam: General: Alert and oriented, in no apparent distress  Cardiovascular: 3/6 systolic continuous murmur. S1 S2 present, RRR. No rubs or gallops.  Respiratory: CTA bilaterally, besides minor rhonchorous sounds sometimes  Abdomen: Soft, no tenderness  to palpation, bowel sounds present  Extremities: 2+ pitting edema in right arm   Laboratory: Most recent CBC Lab Results  Component Value Date   WBC 5.2 10/29/2021   HGB 8.3 (L) 10/29/2021   HCT 26.2 (L) 10/29/2021   MCV 79.9 (L) 10/29/2021   PLT 256 10/29/2021   Most recent BMP    Latest Ref Rng &  Units 10/30/2021    2:20 AM  BMP  Glucose 70 - 99 mg/dL 185   BUN 6 - 20 mg/dL 49   Creatinine 0.61 - 1.24 mg/dL 3.14   Sodium 135 - 145 mmol/L 139   Potassium 3.5 - 5.1 mmol/L 3.3   Chloride 98 - 111 mmol/L 105   CO2 22 - 32 mmol/L 22   Calcium 8.9 - 10.3 mg/dL 8.2    None new   Lupita Raider, Medical Student 10/31/2021, 8:32 AM  [place] Intern pager: [phone], text pages welcome Secure chat group [facility] Teaching Service    Upper Level Addendum:  I have seen and evaluated this patient along with Student Dr. Orlena Sheldon and reviewed the above note, making necessary revisions as appropriate.  I agree with the medical decision making and physical exam as noted above.  Medically stable for SNF   Erskine Emery, MD PGY-3 [place] [facility] Residency

## 2021-11-01 DIAGNOSIS — I272 Pulmonary hypertension, unspecified: Secondary | ICD-10-CM | POA: Diagnosis not present

## 2021-11-01 DIAGNOSIS — E1110 Type 2 diabetes mellitus with ketoacidosis without coma: Secondary | ICD-10-CM | POA: Diagnosis not present

## 2021-11-01 DIAGNOSIS — I130 Hypertensive heart and chronic kidney disease with heart failure and stage 1 through stage 4 chronic kidney disease, or unspecified chronic kidney disease: Secondary | ICD-10-CM | POA: Diagnosis not present

## 2021-11-01 DIAGNOSIS — I10 Essential (primary) hypertension: Secondary | ICD-10-CM | POA: Diagnosis not present

## 2021-11-01 DIAGNOSIS — E1149 Type 2 diabetes mellitus with other diabetic neurological complication: Secondary | ICD-10-CM | POA: Diagnosis not present

## 2021-11-01 DIAGNOSIS — K5981 Ogilvie syndrome: Secondary | ICD-10-CM | POA: Diagnosis not present

## 2021-11-01 DIAGNOSIS — A419 Sepsis, unspecified organism: Secondary | ICD-10-CM | POA: Diagnosis not present

## 2021-11-01 DIAGNOSIS — F321 Major depressive disorder, single episode, moderate: Secondary | ICD-10-CM | POA: Diagnosis not present

## 2021-11-01 DIAGNOSIS — R0902 Hypoxemia: Secondary | ICD-10-CM | POA: Diagnosis not present

## 2021-11-01 DIAGNOSIS — J9600 Acute respiratory failure, unspecified whether with hypoxia or hypercapnia: Secondary | ICD-10-CM | POA: Diagnosis not present

## 2021-11-01 DIAGNOSIS — R1312 Dysphagia, oropharyngeal phase: Secondary | ICD-10-CM | POA: Diagnosis not present

## 2021-11-01 DIAGNOSIS — J9601 Acute respiratory failure with hypoxia: Secondary | ICD-10-CM | POA: Diagnosis not present

## 2021-11-01 DIAGNOSIS — R2689 Other abnormalities of gait and mobility: Secondary | ICD-10-CM | POA: Diagnosis not present

## 2021-11-01 DIAGNOSIS — Z89512 Acquired absence of left leg below knee: Secondary | ICD-10-CM | POA: Diagnosis not present

## 2021-11-01 DIAGNOSIS — Z743 Need for continuous supervision: Secondary | ICD-10-CM | POA: Diagnosis not present

## 2021-11-01 DIAGNOSIS — I3139 Other pericardial effusion (noninflammatory): Secondary | ICD-10-CM | POA: Diagnosis not present

## 2021-11-01 DIAGNOSIS — D509 Iron deficiency anemia, unspecified: Secondary | ICD-10-CM | POA: Diagnosis not present

## 2021-11-01 DIAGNOSIS — Z7401 Bed confinement status: Secondary | ICD-10-CM | POA: Diagnosis not present

## 2021-11-01 DIAGNOSIS — D649 Anemia, unspecified: Secondary | ICD-10-CM | POA: Diagnosis not present

## 2021-11-01 DIAGNOSIS — J189 Pneumonia, unspecified organism: Secondary | ICD-10-CM | POA: Diagnosis not present

## 2021-11-01 DIAGNOSIS — M6281 Muscle weakness (generalized): Secondary | ICD-10-CM | POA: Diagnosis not present

## 2021-11-01 DIAGNOSIS — Z20822 Contact with and (suspected) exposure to covid-19: Secondary | ICD-10-CM | POA: Diagnosis not present

## 2021-11-01 DIAGNOSIS — R41841 Cognitive communication deficit: Secondary | ICD-10-CM | POA: Diagnosis not present

## 2021-11-01 DIAGNOSIS — N179 Acute kidney failure, unspecified: Secondary | ICD-10-CM | POA: Diagnosis not present

## 2021-11-01 DIAGNOSIS — M2550 Pain in unspecified joint: Secondary | ICD-10-CM | POA: Diagnosis not present

## 2021-11-01 DIAGNOSIS — E118 Type 2 diabetes mellitus with unspecified complications: Secondary | ICD-10-CM | POA: Diagnosis not present

## 2021-11-01 DIAGNOSIS — I509 Heart failure, unspecified: Secondary | ICD-10-CM | POA: Diagnosis not present

## 2021-11-01 DIAGNOSIS — E876 Hypokalemia: Secondary | ICD-10-CM | POA: Diagnosis not present

## 2021-11-01 DIAGNOSIS — J440 Chronic obstructive pulmonary disease with acute lower respiratory infection: Secondary | ICD-10-CM | POA: Diagnosis not present

## 2021-11-01 DIAGNOSIS — I69351 Hemiplegia and hemiparesis following cerebral infarction affecting right dominant side: Secondary | ICD-10-CM | POA: Diagnosis not present

## 2021-11-01 DIAGNOSIS — R69 Illness, unspecified: Secondary | ICD-10-CM | POA: Diagnosis not present

## 2021-11-01 DIAGNOSIS — R0602 Shortness of breath: Secondary | ICD-10-CM | POA: Diagnosis not present

## 2021-11-01 DIAGNOSIS — I5033 Acute on chronic diastolic (congestive) heart failure: Secondary | ICD-10-CM | POA: Diagnosis not present

## 2021-11-01 DIAGNOSIS — N183 Chronic kidney disease, stage 3 unspecified: Secondary | ICD-10-CM | POA: Diagnosis not present

## 2021-11-01 DIAGNOSIS — R609 Edema, unspecified: Secondary | ICD-10-CM | POA: Diagnosis not present

## 2021-11-01 DIAGNOSIS — R34 Anuria and oliguria: Secondary | ICD-10-CM | POA: Diagnosis not present

## 2021-11-01 DIAGNOSIS — Z515 Encounter for palliative care: Secondary | ICD-10-CM | POA: Diagnosis not present

## 2021-11-01 LAB — GLUCOSE, CAPILLARY
Glucose-Capillary: 140 mg/dL — ABNORMAL HIGH (ref 70–99)
Glucose-Capillary: 129 mg/dL — ABNORMAL HIGH (ref 70–99)

## 2021-11-01 LAB — BASIC METABOLIC PANEL
Anion gap: 10 (ref 5–15)
BUN: 55 mg/dL — ABNORMAL HIGH (ref 6–20)
CO2: 23 mmol/L (ref 22–32)
Calcium: 8.4 mg/dL — ABNORMAL LOW (ref 8.9–10.3)
Chloride: 106 mmol/L (ref 98–111)
Creatinine, Ser: 2.89 mg/dL — ABNORMAL HIGH (ref 0.61–1.24)
GFR, Estimated: 24 mL/min — ABNORMAL LOW (ref >60)
Glucose, Bld: 113 mg/dL — ABNORMAL HIGH (ref 70–99)
Potassium: 3.1 mmol/L — ABNORMAL LOW (ref 3.5–5.1)
Sodium: 139 mmol/L (ref 135–145)

## 2021-11-01 LAB — MAGNESIUM: Magnesium: 1.6 mg/dL — ABNORMAL LOW (ref 1.7–2.4)

## 2021-11-01 MED ORDER — SODIUM BICARBONATE 650 MG PO TABS: 650.0000 mg | ORAL_TABLET | Freq: Two times a day (BID) | ORAL | Status: DC

## 2021-11-01 MED ORDER — POTASSIUM CHLORIDE CRYS ER 20 MEQ PO TBCR: 40.0000 meq | EXTENDED_RELEASE_TABLET | Freq: Two times a day (BID) | ORAL | 0 refills | Status: DC

## 2021-11-01 MED ORDER — POTASSIUM CHLORIDE CRYS ER 20 MEQ PO TBCR: 20.0000 meq | EXTENDED_RELEASE_TABLET | Freq: Two times a day (BID) | ORAL | 0 refills | Status: DC

## 2021-11-01 MED ORDER — POTASSIUM CHLORIDE 20 MEQ PO PACK
40.0000 meq | PACK | Freq: Two times a day (BID) | ORAL | Status: DC
  Administered 2021-11-01: 40 meq via ORAL
  Filled 2021-11-01: qty 2

## 2021-11-01 MED ORDER — MAGNESIUM OXIDE 400 MG PO CAPS: 400.0000 mg | ORAL_CAPSULE | Freq: Every day | ORAL | 0 refills | Status: DC

## 2021-11-01 MED ORDER — MAGNESIUM SULFATE 2 GM/50ML IV SOLN: 2.0000 g | Freq: Once | INTRAVENOUS | Status: DC

## 2021-11-01 NOTE — Discharge Summary (Addendum)
[facility] Discharge Summary  Patient name: [name] Medical record number: [id number] Date of birth: 15-Apr-1960 Age: 61 y.o. Gender: male Date of Admission: 10/19/2021  Date of Discharge: 11/02/2021 Admitting Physician: Eppie Gibson, MD  Primary Care Provider: Jodi Marble, MD Consultants: nephrology, palliative   Indication for Hospitalization: acute hypoxic respiratory failure   Discharge Diagnoses/Problem List:  Principal Problem:   AKI (acute kidney injury) [facility]) Active Problems:   Hypokalemia   Iron deficiency anemia   Hypertension   Type 2 diabetes mellitus ([place])   (HFpEF) heart failure with preserved ejection fraction (HCC)   Metabolic acidosis   Swelling of arm   MDD (major depressive disorder), recurrent episode, severe (HCC)   Diabetic neuropathy (HCC)   BPH (benign prostatic hyperplasia)   Pressure injury of skin   PAD (peripheral artery disease) ([place])   Multiple thyroid nodules   Community acquired pneumonia  Disposition: SNF  Discharge Condition: Stable  Discharge Exam: performed by Dr. Markus Jarvis on day of d/c General: Alert and oriented, in no apparent distress  Cardiovascular: 3/6 systolic continuous murmur. S1 S2 present, RRR. No rubs or gallops.  Respiratory: CTAB Abdomen: Soft, bowel sounds present, mildly distended and mild tenderness to palpation most likely due to gas  Extremities: 2+ pitting edema in right arm   Brief Hospital Course:  [name] is a 61 y.o. 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 His hospital course is detailed below:   Acute hypoxic respiratory failure, secondary to pneumonia and heart failure Patient presented in sepsis with pulmonary  source, found to have pneumonia on CXR and chest CT angio. Blood cultures were collected and had no growth. Patient completed a day course of azithromycin and 7 day course of ceftriaxone. . Patient was also noted to have fluid overload on admission. Echo 10/21/21 revealed EF of 60-65% and Grade 2 diastolic dysfunction with moderate pulmonary HTN and small pericardial effusion. Patient required aggressive diuresis, which was managed by nephrology with Lasix and metolazone. Patient was able to transition to oral diuresis and was monitored with significant improvement in symptoms.    AKI (acute kidney injury) [facility]) Patient presented with AKI, likely from fluid overload. Patient was aggressively diuresed with IV lasix with assistance from nephrology given tenuous kidney status. Patient was not requiring dialysis while here but did require IV albumin and was placed on phosphate binder and sodium bicarb. Renal US was unremarkable. Patient required a foley due to urinary retention. Patient transitioned to oral lasix BID, which was well tolerated. Patient was able to pass a void trial without further retention prior to discharge.   Iron deficiency anemia Labs significant for iron deficiency anemia. Patient required 1 unit of PRBC. Patient received Aranesp injection and was given 4 doses of IV iron. Given concern for GI origin, patient was also started on protonix. No further transfusions were required during hospitalization. Hemoglobin stable upon discharge  Type 2 Diabetes Patient's home insulin was discontinued upon admission as his sugars were overall well controlled. CBGs were monitored and overall well controlled.   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  Diabetes: Glucose remained between 120-180 and did not  require insulin. Lyrica for diabetic neuropathy HTN: amlodipine  PAD:  atorvastatin BPH: flomax  MDD: zoloft    Items for PCP Follow-Up Recommend outpatient GI follow-up for EGD and/or Colonoscopy given iron deficiency anemia and high likelihood of chronic GI bleed.  Abnormal finding on CT chest angio showing multiple thyroid nodules. Needs outpatient thyroid ultrasound. Recommend wound care for chronic sacral pressure ulcer. Palliative consult [place] discussion baseline disability and multiple acute processes. Patient was not ready for hospice and is uncertain on wishes for HD. Agreeable to outpatient palliative care referral.  Monitor CBGs as patient's home insulin was discontinued and not required during hospitalization.  Repeat BMP to monitor potassium and creatinine Will discharge on potassium 40 meq PO BID x 2 days then reduce to potassium chloride 20 meq BID after that Starting magnesium oxide '400mg'$  daily   Significant Procedures: none  Significant Labs and Imaging:  Recent Labs  Lab 10/28/21 0659 10/29/21 0251 10/31/21 1249  WBC 4.8 5.2 5.3  HGB 7.7* 8.3* 7.8*  HCT 24.3* 26.2* 25.5*  PLT 219 256 246   Recent Labs  Lab 10/27/21 0819 10/28/21 0659 10/29/21 0251 10/30/21 0220 10/31/21 1249 11/01/21 0952  NA 139 138 139 139 138 139  K 3.5 3.2* 3.0* 3.3* 3.3* 3.1*  CL 112* 111 109 105 106 106  CO2 21* 19* 20* 22 21* 23  GLUCOSE 130* 146* 130* 185* 167* 113*  BUN 49* 50* 48* 49* 53* 55*  CREATININE 3.69* 3.45* 3.33* 3.14* 3.02* 2.89*  CALCIUM 7.9* 7.8* 8.1* 8.2* 8.2* 8.4*  MG  --   --  1.7  --  1.6* 1.6*  PHOS 5.8* 5.6* 5.5* 4.7* 4.5  --   ALBUMIN 2.0* 1.9* 2.0* 2.0* 2.0*  --     VAS [place] UPPER EXTREMITY VENOUS DUPLEX  Result Date: 10/24/2021 Summary:  Right: No evidence of deep vein thrombosis in the upper extremity. No evidence of superficial vein thrombosis in the upper extremity. Significant subcutaneous edema noted throughout the right upper extremity.  Left: No evidence of deep vein thrombosis in the upper extremity. No evidence of  superficial vein thrombosis in the upper extremity.  *See table(s) above for measurements and observations.  Diagnosing physician: Jamelle Haring Electronically signed by Jamelle Haring on 10/24/2021 at 19:54:12 PM.    Final    US RENAL Result Date: 10/23/2021 IMPRESSION: No acute sonographic abnormality of the kidneys. Electronically Signed   By: Miachel Roux M.D.   On: 10/23/2021 09:03   ECHOCARDIOGRAM COMPLETE Result Date: 10/21/2021 IMPRESSIONS  1. Left ventricular ejection fraction, by estimation, is 60 to 65%. The left ventricle has normal function. The left ventricle has no regional wall motion abnormalities. Left ventricular diastolic parameters are consistent with Grade II diastolic dysfunction (pseudonormalization). Elevated left ventricular end-diastolic pressure. The E/e' is 87.  2. Right ventricular systolic function is normal. The right ventricular size is normal. There is moderately elevated pulmonary artery systolic pressure. The estimated right ventricular systolic pressure is 01.6 mmHg.  3. Left atrial size was moderately dilated.  4. A small pericardial effusion is present. The pericardial effusion is circumferential. There is no evidence of cardiac tamponade.  5. The mitral valve is abnormal. Mild mitral valve regurgitation.  6. The aortic valve is tricuspid. Aortic valve regurgitation is trivial. Moderate aortic valve stenosis. Aortic valve area, by VTI measures 1.27 cm. Aortic valve mean gradient measures 17.0 mmHg. Aortic valve Vmax measures 2.81 m/s.  7. The inferior vena cava is dilated in size with <50%  respiratory variability, suggesting right atrial pressure of 15 mmHg. Comparison(s): Changes from prior study are noted. 08/22/2020: LVEF 65-70%, grade 2 DD, moderate LAE, small pericardial effusion - the effusion appears unchanged. Electronically signed by Lyman Bishop MD Signature Date/Time: 10/21/2021/12:38:20 PM    Final    CT Angio Chest PE W and/or Wo Contrast Result Date:  10/19/2021 IMPRESSION: 1. Motion artifacts at both lung bases with no definite pulmonary emboli seen. 2. Cardiomegaly and changes of acute congestive heart failure. 3. Extensive dense airspace opacity in both lungs, predominantly in a dependent distribution with a small right pleural effusion and minimal left pleural effusion. Differential considerations include dense alveolar edema and pleural effusions associated with acute CHF and extensive bilateral pneumonia with associated pleural effusions. 4. Multiple thyroid nodules measuring up to 2.1 cm in maximum diameter each, with progression. Recommend thyroid US (ref: J Am Coll Radiol. 2015 Feb;12(2): 143-50). 5.  Calcific coronary artery and aortic atherosclerosis. Aortic Atherosclerosis (ICD10-I70.0). Electronically Signed   By: Claudie Revering M.D.   On: 10/19/2021 14:49   DG Chest Portable 1 View Result Date: 10/19/2021 IMPRESSION: Patchy BILATERAL pulmonary infiltrates which could reflect pneumonia, pulmonary edema or alveolar hemorrhage. Small RIGHT pleural effusion. Electronically Signed   By: Lavonia Dana M.D.   On: 10/19/2021 12:51     Results/Tests Pending at Time of Discharge: none  Discharge Medications:  Allergies as of 11/01/2021       Reactions   Other Anaphylaxis, Itching, Swelling, Other (See Comments)   Grits - swelling        Medication List     STOP taking these medications    Basaglar KwikPen 100 UNIT/ML   dorzolamide 2 % ophthalmic solution Commonly known as: TRUSOPT   insulin lispro 100 UNIT/ML KwikPen Commonly known as: HUMALOG   oxyCODONE 5 MG immediate release tablet Commonly known as: Oxy IR/ROXICODONE       TAKE these medications    acetaminophen 650 MG CR tablet Commonly known as: TYLENOL Take 650 mg by mouth every 4 (four) hours as needed (general discomfort).   albuterol 108 (90 Base) MCG/ACT inhaler Commonly known as: VENTOLIN HFA Inhale 2 puffs into the lungs every 6 (six) hours as needed for  wheezing or shortness of breath.   amLODipine 10 MG tablet Commonly known as: NORVASC Take 0.5 tablets (5 mg total) by mouth daily. What changed:  how much to take when to take this   ascorbic acid 500 MG tablet Commonly known as: VITAMIN C Take 500 mg by mouth 2 (two) times daily.   aspirin 81 MG chewable tablet Chew 81 mg by mouth daily.   atorvastatin 80 MG tablet Commonly known as: LIPITOR Take 80 mg by mouth at bedtime.   brimonidine 0.2 % ophthalmic solution Commonly known as: ALPHAGAN Place 1 drop into both eyes 2 (two) times daily. What changed: Another medication with the same name was removed. Continue taking this medication, and follow the directions you see here.   calcium acetate 667 MG capsule Commonly known as: PHOSLO Take 1 capsule (667 mg total) by mouth 3 (three) times daily with meals.   docusate sodium 100 MG capsule Commonly known as: COLACE Take 100 mg by mouth at bedtime.   dorzolamide-timolol 22.3-6.8 MG/ML ophthalmic solution Commonly known as: COSOPT Place 1 drop into both eyes 2 (two) times daily.   feeding supplement (NEPRO CARB STEADY) Liqd Take 237 mLs by mouth at bedtime.   ferrous sulfate 325 (65 FE) MG tablet Take 1  tablet (325 mg total) by mouth daily.   furosemide 80 MG tablet Commonly known as: LASIX Take 1 tablet (80 mg total) by mouth 2 (two) times daily. What changed:  medication strength how much to take when to take this   latanoprost 0.005 % ophthalmic solution Commonly known as: XALATAN Place 1 drop into both eyes at bedtime.   Magnesium Oxide 400 MG Caps Take 1 capsule (400 mg total) by mouth daily.   multivitamin tablet Take 1 tablet by mouth daily.   multivitamin Tabs tablet Take 1 tablet by mouth at bedtime.   ondansetron 4 MG tablet Commonly known as: ZOFRAN Take 4 mg by mouth every 4 (four) hours as needed for nausea or vomiting.   pantoprazole 40 MG tablet Commonly known as: PROTONIX Take 1 tablet  (40 mg total) by mouth daily.   potassium chloride SA 20 MEQ tablet Commonly known as: KLOR-CON M Take 2 tablets (40 mEq total) by mouth 2 (two) times daily for 2 days. What changed: You were already taking a medication with the same name, and this prescription was added. Make sure you understand how and when to take each.   potassium chloride SA 20 MEQ tablet Commonly known as: KLOR-CON M Take 1 tablet (20 mEq total) by mouth 2 (two) times daily. Start taking on: November 04, 2021 What changed: These instructions start on November 04, 2021. If you are unsure what to do until then, ask your doctor or other care provider.   pregabalin 75 MG capsule Commonly known as: LYRICA Take 1 capsule (75 mg total) by mouth daily. What changed: when to take this   PROTEIN PO Take 30 mLs by mouth 2 (two) times daily.   saccharomyces boulardii 250 MG capsule Commonly known as: FLORASTOR Take 1 capsule (250 mg total) by mouth 2 (two) times daily.   sertraline 50 MG tablet Commonly known as: ZOLOFT Take 50 mg by mouth every evening.   sodium bicarbonate 650 MG tablet Take 2 tablets (1,300 mg total) by mouth 2 (two) times daily.   tamsulosin 0.4 MG Caps capsule Commonly known as: FLOMAX Take 0.8 mg by mouth every evening.   Vitamin D (Ergocalciferol) 1.25 MG (50000 UNIT) Caps capsule Commonly known as: DRISDOL Take 50,000 Units by mouth every 30 (thirty) days. 17th of each month   zinc sulfate 220 (50 Zn) MG capsule Take 220 mg by mouth daily.               Discharge Care Instructions  (From admission, onward)           Start     Ordered   11/01/21 0000  Discharge wound care:       Comments: Per wound care instructions   11/01/21 1238            Discharge Instructions: Please refer to Patient Instructions section of EMR for full details.  Patient was counseled important signs and symptoms that should prompt return to medical care, changes in medications, dietary  instructions, activity restrictions, and follow up appointments.   Follow-Up Appointments:  Contact information for after-discharge care     Destination     [facility] .   Service: Skilled Nursing Contact information: [address] [address] [place], [place], DO 11/01/2021, 2:41 PM PGY-3, [place]

## 2021-11-01 NOTE — Plan of Care (Signed)
d/c pending insurance authorization. Pt alert and oriented x 4.  Problem: Health Behavior/Discharge Planning: Goal: Ability to manage health-related needs will improve Outcome: Progressing   Problem: Clinical Measurements: Goal: Ability to maintain clinical measurements within normal limits will improve Outcome: Progressing Goal: Will remain free from infection Outcome: Progressing Goal: Diagnostic test results will improve Outcome: Progressing Goal: Respiratory complications will improve Outcome: Progressing Goal: Cardiovascular complication will be avoided Outcome: Progressing   Problem: Activity: Goal: Risk for activity intolerance will decrease Outcome: Progressing   Problem: Nutrition: Goal: Adequate nutrition will be maintained Outcome: Progressing   Problem: Coping: Goal: Level of anxiety will decrease Outcome: Progressing   Problem: Elimination: Goal: Will not experience complications related to bowel motility Outcome: Progressing Goal: Will not experience complications related to urinary retention Outcome: Progressing   Problem: Pain Managment: Goal: General experience of comfort will improve Outcome: Progressing   Problem: Safety: Goal: Ability to remain free from injury will improve Outcome: Progressing   Problem: Skin Integrity: Goal: Risk for impaired skin integrity will decrease Outcome: Progressing

## 2021-11-01 NOTE — Progress Notes (Addendum)
Daily Progress Note Intern Pager: [phone]  Patient name: [name] Medical record number: [id number] Date of birth: 1960-05-27 Age: 61 y.o. Gender: male  Primary Care Provider: Jodi Marble, MD Consultants: nephrology, palliative Code Status: FULL  Pt Overview and Major Events to Date:  10/19/21: Admitted to FPTS  Assessment and Plan: [name] is a 61 y.o. 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 PNA now resolved. Diuresing per nephro recs. Ongoing [place] discussions with palliative - patient would like all measures to prolong life and avoid dialysis.   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  4L yesterday!!  - Nephrology following, appreciate care and recommendations  - Continue Lasix 80 mg PO BID - Continue phosphate binder, Phoslo 667 mg  - Continue to trend daily BMP  - Avoid nephrotoxic agents - Strict I/Os and daily weights   - Per palliative, pt is not interested in HD at this time  Iron deficiency anemia Hgb improving s/p iron infusions. - Continue 40 mg Protonix  - Trend daily H&H  - Nutrition consulted - s/p 1uPRBC 8/27. Threshold for transfusion <7  Hypokalemia Patient will need to restart his supplements at SNF. K was 3.3 yesterday and repleted.   Swelling of arm Right sided weakness from prior CVA. Right arm swollen at baseline due to immobility and fluid overload.  - Monitor right arm circumference: 15cm 9/4 (stable)  Metabolic acidosis CO2 stably low.  - Continue sodium bicarbonate tablet 1,300 mg PO BID  (HFpEF) heart failure with preserved ejection fraction ([place]) Echo 10/21/21 revealed EF of 60-65% and Grade 2 diastolic dysfunction with moderate pulmonary  hypertension, moderate aortic stenosis, and small pericardial effusion. Having good UOP. - Strict I/Os and daily weights  - Diurese per nephrology   Type 2 diabetes mellitus (HCC) Stable without insulin.  - CBG checks qAC HS  Multiple thyroid nodules Chest CT angio revealed "multiple thyroid nodules measuring up to 2.1 cm in maximum diameter each, with progression.  -TSH 1.541 WNL  -Recommend follow up outpatient for thyroid ultrasound on d/c  Pressure injury of skin Sacral ulcer present per SNF documentation. Unable to visualize due to inability to turn patient without multiple staff assistance. - Wound care nurse consulted, appreciate care and recs   Chronic problems: Amlodipine for HTN, atorvastatin for PAD, Flomax for BPH, Lyrica for diabetic neuropathy, Zoloft for MDD.  FEN/GI: Regular diet  PPx: Heparin Dispo:SNF pending insurance.   Subjective:  Patient was seen this morning resting at bedside comfortably. No complaints. Describes mild chest pain that happened once on R side of chest, not reproducible with palpation. Says it's resolved now. Mildly distended/TTP abdomen - most likely constipation/gas causing mild chest and abdominal pain. Last BM was yesterday. Denies resting SOB.   Objective: Temp:  [97.6 F (36.4 C)-98.2 F (36.8 C)] 97.6 F (36.4 C) (09/06 2132) Pulse Rate:  [76-78] 77 (09/07 0526) Resp:  [18-20] 18 (09/07 0526) BP: (142-159)/(74-98) 145/93 (09/07 0526) SpO2:  [95 %-100 %] 100 % (09/07 0526) Physical Exam: General: Alert and oriented, in no apparent distress  Cardiovascular: 3/6 systolic continuous murmur. S1 S2 present, RRR. No rubs or gallops.  Respiratory: CTAB Abdomen: Soft, bowel sounds present, mildly  distended and mild tenderness to palpation most likely due to gas  Extremities: 2+ pitting edema in right arm   Laboratory: Most recent CBC Lab Results  Component Value Date   WBC 5.3 10/31/2021   HGB 7.8 (L) 10/31/2021   HCT 25.5 (L)  10/31/2021   MCV 82.8 10/31/2021   PLT 246 10/31/2021   Most recent BMP    Latest Ref Rng & Units 10/31/2021   12:49 PM  BMP  Glucose 70 - 99 mg/dL 167   BUN 6 - 20 mg/dL 53   Creatinine 0.61 - 1.24 mg/dL 3.02   Sodium 135 - 145 mmol/L 138   Potassium 3.5 - 5.1 mmol/L 3.3   Chloride 98 - 111 mmol/L 106   CO2 22 - 32 mmol/L 21   Calcium 8.9 - 10.3 mg/dL 8.2    EKG shows NSR with 1st degree AV block, QT c 436   Eshraghi, Sampson Si, Medical Student 11/01/2021, 7:15 AM [place] Intern pager: [phone], text pages welcome Secure chat group [place] Upper-Level Resident Addendum   I have independently interviewed and examined the patient. I have discussed the above with the original author and agree with their documentation. My edits for correction/addition/clarification are in within the document. Please see also any attending notes.   Rise Patience, DO  PGY-3, [facility] 11/01/2021 3:40 PM  FPTS Service pager: [phone] (text pages welcome through [facility])

## 2021-11-01 NOTE — TOC Transition Note (Signed)
Transition of Care [facility]) - CM/SW Discharge Note   Patient Details  Name: RUDOLF BLIZARD MRN: [id number] Date of Birth: 1960-05-03  Transition of Care [facility]) CM/SW Contact:  Milinda Antis, [place] Phone Number: 11/01/2021, 2:28 PM   Clinical Narrative:    Patient will DC to:  [facility] Anticipated DC date: 11/01/2021 Transport by: Corey Harold   Per MD patient ready for DC to SNF. RN to call report prior to discharge [phone] room 224A. RN, patient, patient's family, and facility notified of DC. Discharge Summary and FL2 sent to facility. DC packet on chart. Ambulance transport requested for patient.   CSW will sign off for now as social work intervention is no longer needed. Please consult [place] again if new needs arise.   Final next level of care: [place]     Patient Goals and CMS Choice        Discharge Placement              Patient chooses bed at:  [facility]) Patient to be transferred to facility by: [facility] Name of family member notified: Remberto Lienhard Patient and family notified of of transfer: 11/01/21  Discharge Plan and Services                                     Social Determinants of Health (SDOH) Interventions     Readmission Risk Interventions    01/12/2020    3:39 PM  Readmission Risk Prevention Plan  Transportation Screening Complete  Palliative Care Screening Not Applicable

## 2021-11-01 NOTE — Progress Notes (Signed)
[facility] Progress Note  Name: [name] MRN: [id number] DOB: 1960-11-05   Subjective:  He had 4.1L uop over 9/6 charted.  He has been here waiting on approval for discharge back to SNF.  Per CSW he is going today.  He's glad to hear about going out.   Review of systems:    Denies n/v  Denies shortness of breath  Denies chest pain    Intake/Output Summary (Last 24 hours) at 11/01/2021 1426 Last data filed at 11/01/2021 1300 Gross per 24 hour  Intake 250 ml  Output 3200 ml  Net -2950 ml    Vitals:  Vitals:   10/31/21 2132 11/01/21 0526 11/01/21 0800 11/01/21 0915  BP:  (!) 145/93 (!) 144/82 (!) 162/92  Pulse: 78 77 78 81  Resp: '18 18 19   '$ Temp: 97.6 F (36.4 C)     TempSrc: Oral     SpO2: 97% 100% 96%   Weight:      Height:         Physical Exam:   General adult male in bed in no acute distress HEENT normocephalic atraumatic extraocular movements intact sclera anicteric Neck supple trachea midline Lungs clear to auscultation bilaterally normal work of breathing at rest  Heart S1S2 no rub Abdomen soft nontender nondistended Extremities 1-2+ edema right leg and left BKA Psych normal mood and affect GU foley is out   Medications reviewed   Labs:     Latest Ref Rng & Units 11/01/2021    9:52 AM 10/31/2021   12:49 PM 10/30/2021    2:20 AM  BMP  Glucose 70 - 99 mg/dL 113  167  185   BUN 6 - 20 mg/dL 55  53  49   Creatinine 0.61 - 1.24 mg/dL 2.89  3.02  3.14   Sodium 135 - 145 mmol/L 139  138  139   Potassium 3.5 - 5.1 mmol/L 3.1  3.3  3.3   Chloride 98 - 111 mmol/L 106  106  105   CO2 22 - 32 mmol/L '23  21  22   '$ Calcium 8.9 - 10.3 mg/dL 8.4  8.2  8.2      Assessment/Plan:   Pt is a 61 y.o. yo male  with past medical history of hypertension, diabetes, TIA, DVT, peripheral vascular disease with left BKA, bipolar mood disorder, CHF who was admitted on 8/25 for shortness of breath and fluid overload seen as a consultation for the management of  acute kidney injury.    #Acute kidney injury on CKD stage III: Multifactorial etiology including IV contrast on 8/25, CHF exacerbation/PNA and severe anemia and +bladder outlet obstruction.  UA with chronic proteinuria without RBC. Bladder scan with acute urinary retention requiring intermittent catheterization.  Kidney ultrasound unremarkable.  Initially treated with IV Lasix, metolazone and albumin with good response.   - continue lasix at current regimen - lasix 80 mg PO BID   - Continues to have good urine output and cr improving on last check   # Urinary retention - has received in/out cath previously and then foley was placed.  Foley is out. Monitor for retention   #Acute on chronic diastolic CHF:  Echo with grade 2 diastolic dysfunction.  breathing better; some peripheral edema which seems to be chronic.  Continue diuretics as above - need daily weights while here   #Metabolic acidosis: reduce sodium bicarbonate to 650 mg BID    #Hypokalemia due to diuretics: repleted.  Would discharge on potassium  40 meq PO BID x 2 days then reduce to potassium chloride 20 meq BID.  Would repeat a BMP and magnesium at the SNF with results to SNF physician within one week.  Would replete mag  - mag oxide 400 mg daily.    #Acute hypoxic respiratory failure due to HCAP and CHF exacerbation: Completed antibiotics for pneumonia, diuretics as above. Respiratory status much improved per charting    #Iron deficiency anemia: Iron saturation is only 5%, s/p IV iron x 4 doses.  Received Aranesp 60 mcg on 8/29 - redose at 100 mcg every Tuesday for now    # HTN: continue current regimen    #Right upper extremity swelling, localized, ultrasound with no DVT.  No sign of infection.  Disposition - disposition per primary team.  I will set up nephrology follow-up for two weeks from now     Claudia Desanctis, MD 11/01/2021 2:37 PM

## 2021-11-01 NOTE — Progress Notes (Signed)
Patient ID: [name], male   DOB: 07/16/1960, 61 y.o.   MRN: [id number]    11/01/21 0900  ECG Monitoring  Ectopy  (svt, 12- 14 BEATS)   Per CCMD BP (!) 162/92 (BP Location: Left Arm)   Pulse 81   Temp 97.6 F (36.4 C) (Oral)   Resp 19   Ht '5\' 8"'$  (1.727 m)   Wt 123 kg   SpO2 96%   BMI 41.23 kg/m   Patient asymptomatic.  K+ level 3.1. MD notified and aware.    Haydee Salter, RN

## 2021-11-01 NOTE — Progress Notes (Signed)
Assessment and vitals taken by Jarold Song, nursing student. RN agrees with D. Prewette assessment.   Loraine Grip, RN, BSN, RNC-OB

## 2021-11-01 NOTE — Plan of Care (Signed)
Patient ID: [name], male   DOB: [birth year], 61 y.o.   MRN: [id number]  Problem: Health Behavior/Discharge Planning: Goal: Ability to manage health-related needs will improve Outcome: Adequate for Discharge   Problem: Clinical Measurements: Goal: Ability to maintain clinical measurements within normal limits will improve Outcome: Adequate for Discharge Goal: Will remain free from infection Outcome: Adequate for Discharge Goal: Diagnostic test results will improve Outcome: Adequate for Discharge Goal: Respiratory complications will improve Outcome: Adequate for Discharge Goal: Cardiovascular complication will be avoided Outcome: Adequate for Discharge   Problem: Activity: Goal: Risk for activity intolerance will decrease Outcome: Adequate for Discharge   Problem: Nutrition: Goal: Adequate nutrition will be maintained Outcome: Adequate for Discharge   Problem: Coping: Goal: Level of anxiety will decrease Outcome: Adequate for Discharge   Problem: Elimination: Goal: Will not experience complications related to bowel motility Outcome: Adequate for Discharge Goal: Will not experience complications related to urinary retention Outcome: Adequate for Discharge   Problem: Pain Managment: Goal: General experience of comfort will improve Outcome: Adequate for Discharge   Problem: Safety: Goal: Ability to remain free from injury will improve Outcome: Adequate for Discharge   Problem: Skin Integrity: Goal: Risk for impaired skin integrity will decrease Outcome: Adequate for Discharge  Haydee Salter, RN

## 2021-11-01 NOTE — Progress Notes (Signed)
Patient ID: [name], male   DOB: 03/08/1960, 61 y.o.   MRN: [id number] Report given to [place] at [facility]. Awaiting [facility] transport.  Haydee Salter, RN

## 2021-11-01 NOTE — Discharge Instructions (Signed)
Wound care to healed areas of previous pressure injury to sacrum: Cleanse with soap and water, pat dry. Cover with silicone foam for sacrum. Place dressing with "tip" oriented pointing away from anus.  Turn side to side.

## 2021-11-02 DIAGNOSIS — I10 Essential (primary) hypertension: Secondary | ICD-10-CM | POA: Diagnosis not present

## 2021-11-02 DIAGNOSIS — E118 Type 2 diabetes mellitus with unspecified complications: Secondary | ICD-10-CM | POA: Diagnosis not present

## 2021-11-07 DIAGNOSIS — J189 Pneumonia, unspecified organism: Secondary | ICD-10-CM | POA: Diagnosis not present

## 2021-11-07 DIAGNOSIS — I509 Heart failure, unspecified: Secondary | ICD-10-CM | POA: Diagnosis not present

## 2021-11-12 DIAGNOSIS — E118 Type 2 diabetes mellitus with unspecified complications: Secondary | ICD-10-CM | POA: Diagnosis not present

## 2021-11-12 DIAGNOSIS — M6281 Muscle weakness (generalized): Secondary | ICD-10-CM | POA: Diagnosis not present

## 2021-11-13 DIAGNOSIS — E118 Type 2 diabetes mellitus with unspecified complications: Secondary | ICD-10-CM | POA: Diagnosis not present

## 2021-11-13 DIAGNOSIS — M6281 Muscle weakness (generalized): Secondary | ICD-10-CM | POA: Diagnosis not present

## 2021-11-14 DIAGNOSIS — E118 Type 2 diabetes mellitus with unspecified complications: Secondary | ICD-10-CM | POA: Diagnosis not present

## 2021-11-14 DIAGNOSIS — M6281 Muscle weakness (generalized): Secondary | ICD-10-CM | POA: Diagnosis not present

## 2021-11-15 DIAGNOSIS — E119 Type 2 diabetes mellitus without complications: Secondary | ICD-10-CM | POA: Diagnosis not present

## 2021-11-15 DIAGNOSIS — M6281 Muscle weakness (generalized): Secondary | ICD-10-CM | POA: Diagnosis not present

## 2021-11-15 DIAGNOSIS — E118 Type 2 diabetes mellitus with unspecified complications: Secondary | ICD-10-CM | POA: Diagnosis not present

## 2021-11-15 DIAGNOSIS — I509 Heart failure, unspecified: Secondary | ICD-10-CM | POA: Diagnosis not present

## 2021-11-15 DIAGNOSIS — E1140 Type 2 diabetes mellitus with diabetic neuropathy, unspecified: Secondary | ICD-10-CM | POA: Diagnosis not present

## 2021-11-15 DIAGNOSIS — R69 Illness, unspecified: Secondary | ICD-10-CM | POA: Diagnosis not present

## 2021-11-15 DIAGNOSIS — F321 Major depressive disorder, single episode, moderate: Secondary | ICD-10-CM | POA: Diagnosis not present

## 2021-11-15 DIAGNOSIS — J189 Pneumonia, unspecified organism: Secondary | ICD-10-CM | POA: Diagnosis not present

## 2021-11-16 IMAGING — CT CT ABD-PELV W/ CM
2 of 5 series · 17 of 46 positions shown, 19 images · IV contrast (omnipaque)
Comparison: 01/16/2020

CLINICAL DATA: Abdominal pain, fever

EXAM:
CT ABDOMEN AND PELVIS WITH CONTRAST
TECHNIQUE: Multidetector CT imaging of the abdomen and pelvis was performed
using the standard protocol following bolus administration of
intravenous contrast.
CONTRAST:  100mL OMNIPAQUE IOHEXOL 300 MG/ML  SOLN

[Series 2: axial st · axial · 0.84mm/px · z∈[-447,-47]mm · 14 of 94 slices shown, 16 images]
[im 7/94  soft-tissue]
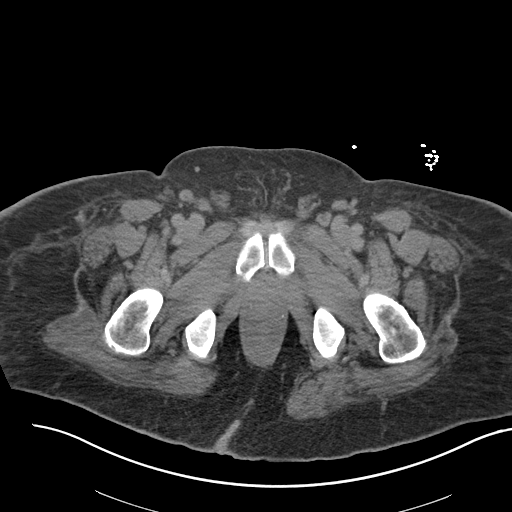
[im 7/94  bone]
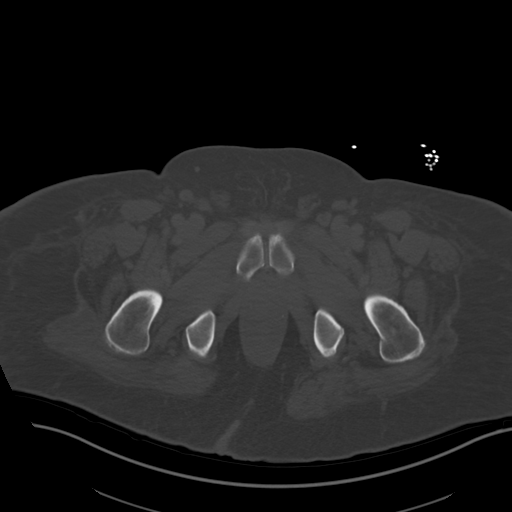
[im 13/94  soft-tissue]
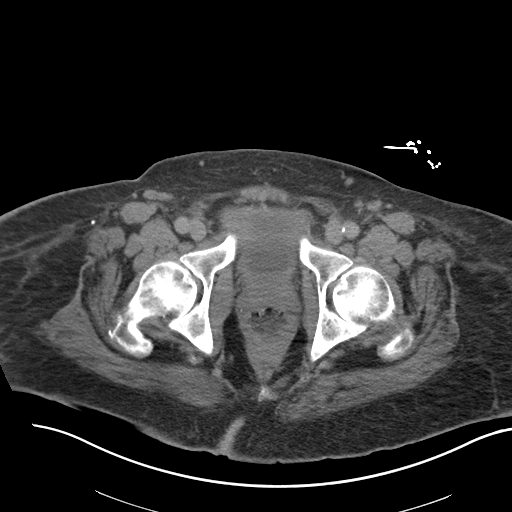
[im 19/94  soft-tissue]
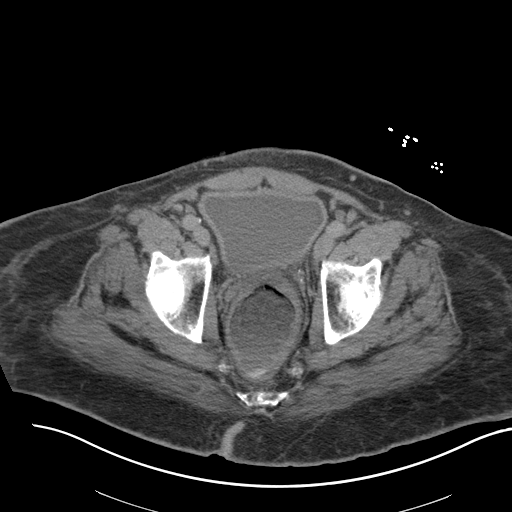
[im 25/94  soft-tissue]
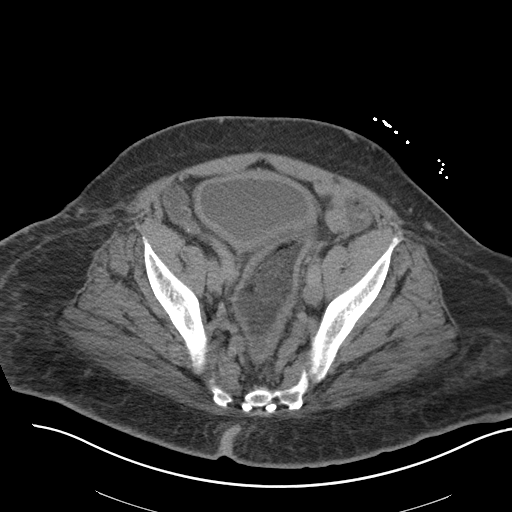
[im 32/94  soft-tissue]
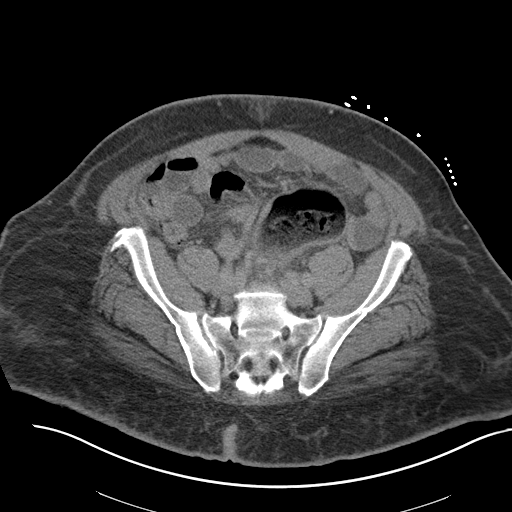
[im 38/94  soft-tissue]
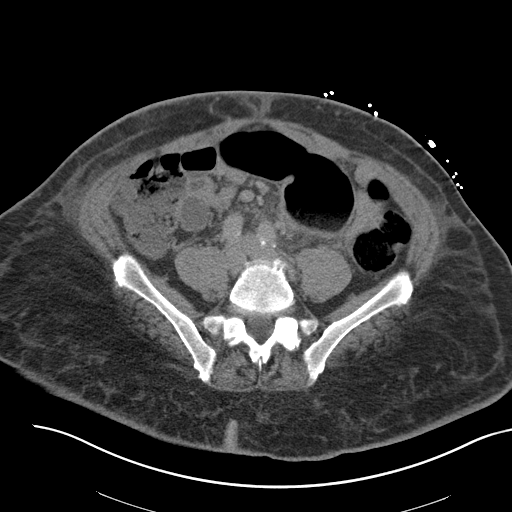
[im 44/94  soft-tissue]
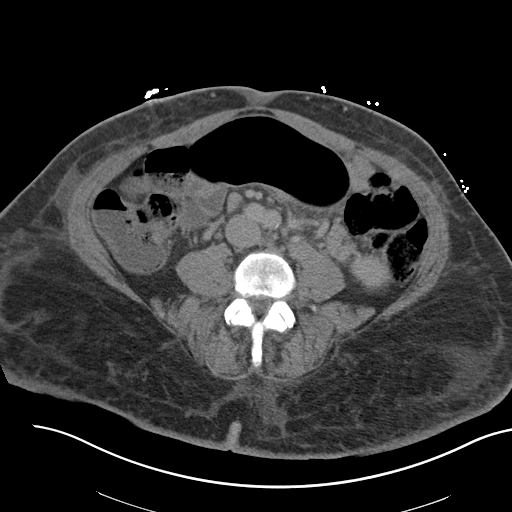
[im 50/94  soft-tissue]
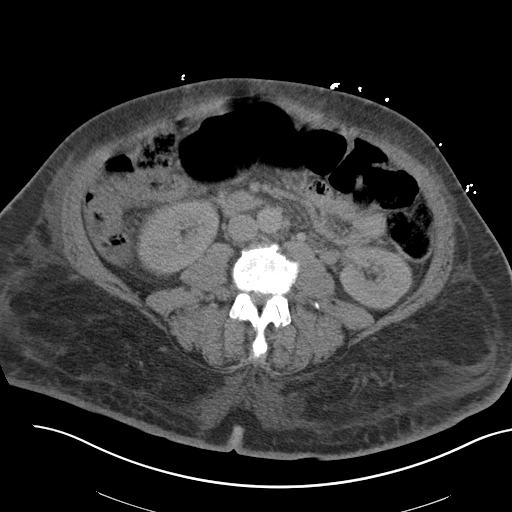
[im 56/94  soft-tissue]
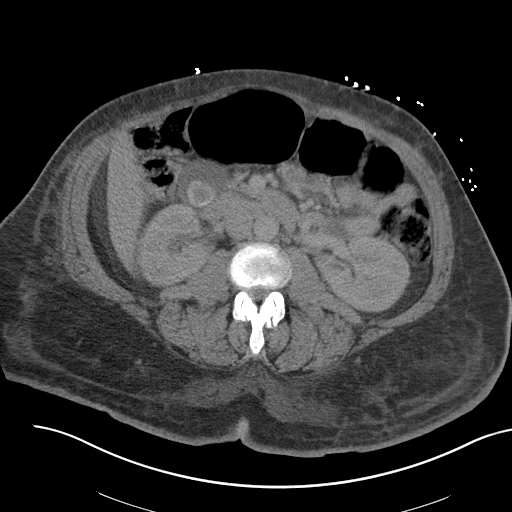
[im 56/94  bone]
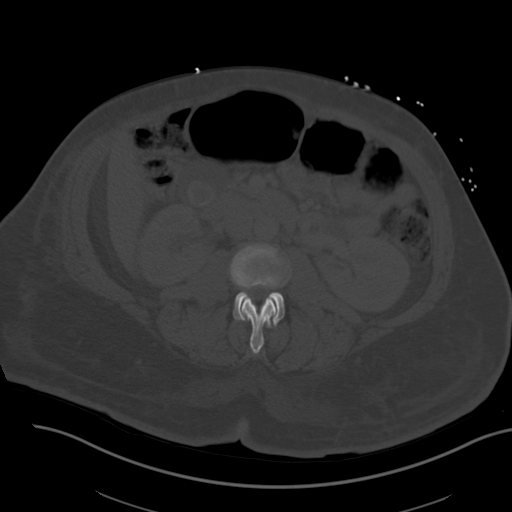
[im 63/94  soft-tissue]
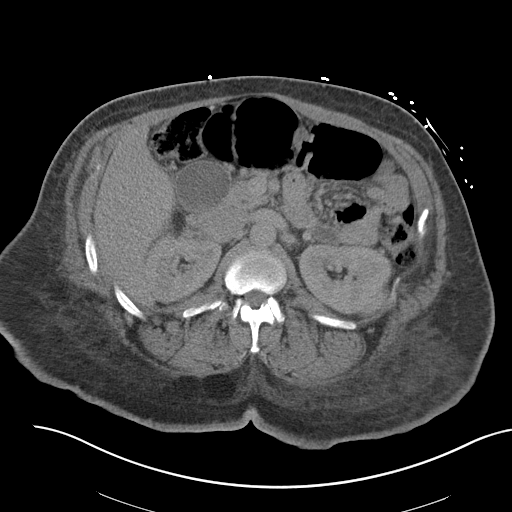
[im 69/94  soft-tissue]
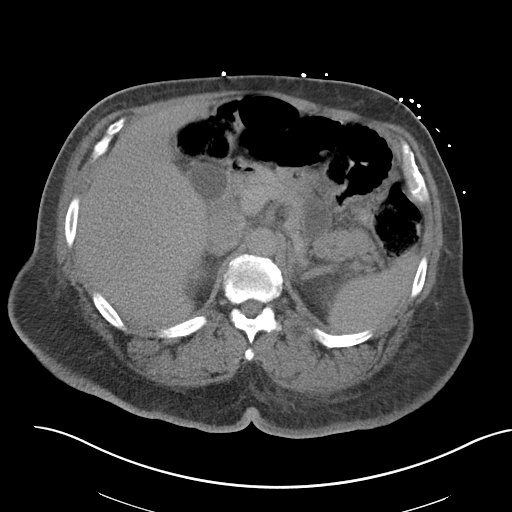
[im 75/94  soft-tissue]
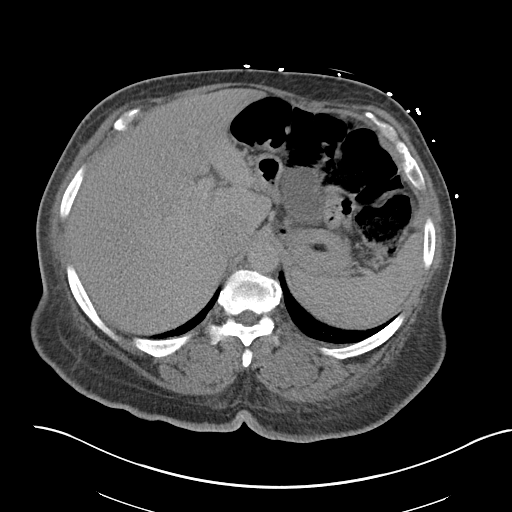
[im 81/94  soft-tissue]
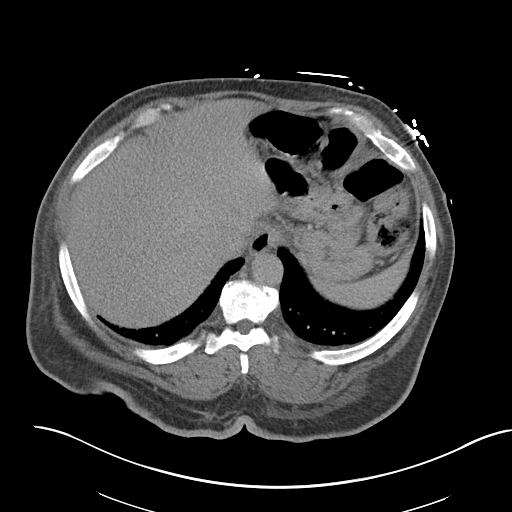
[im 87/94  soft-tissue]
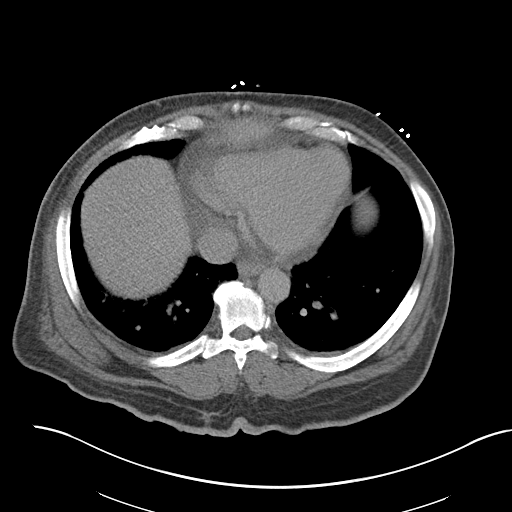

[Series 5: coronal st · coronal · 0.89mm/px · 3 of 164 slices shown]
[im 55/164  soft-tissue]
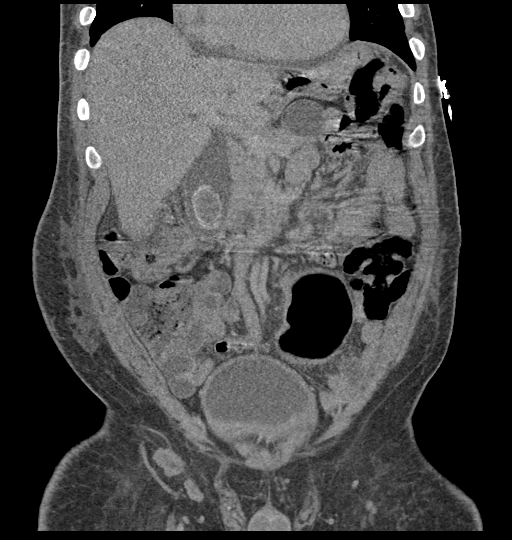
[im 73/164  soft-tissue]
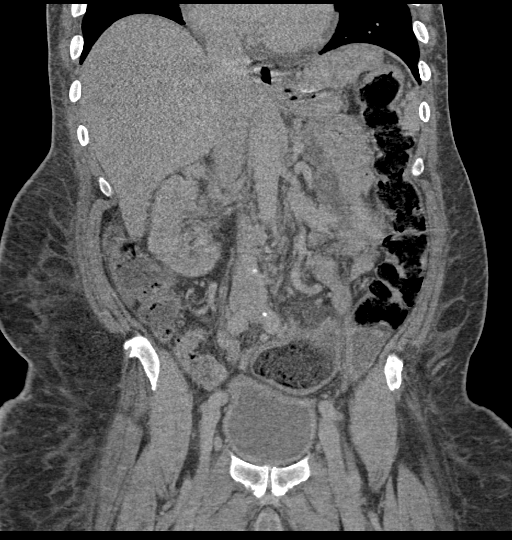
[im 91/164  soft-tissue]
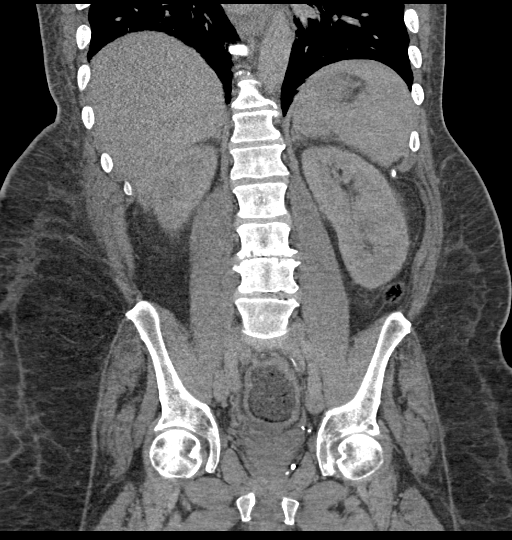

[17 of 46 positions shown; findings below may reference images not displayed]

FINDINGS: Lower chest: Mild cardiomegaly.  Small pericardial effusion, stable.

Hepatobiliary: 2.4 cm gallstone within the gallbladder fundus. No
focal hepatic abnormality.

Pancreas: No focal abnormality or ductal dilatation.

Spleen: No focal abnormality.  Normal size.

Adrenals/Urinary Tract: No adrenal abnormality. No focal renal
abnormality. No stones or hydronephrosis. Urinary bladder is
unremarkable.

Stomach/Bowel: Gaseous distention of bowel, predominantly colon.
This may reflect mild ileus. Small bowel decompressed. Large stool
burden in the rectosigmoid colon.

Vascular/Lymphatic: No evidence of aneurysm or adenopathy.

Reproductive: No visible focal abnormality.

Other: No free fluid or free air.

Musculoskeletal: No acute bony abnormality.
IMPRESSION: Moderate stool burden in the rectosigmoid colon with mild gaseous
distention of the colon which may reflect mild ileus.

Cholelithiasis.

Small pericardial effusion, similar to prior study.

## 2021-11-16 IMAGING — DX DG CHEST 1V PORT
1 series · 1 of 1 positions shown · non-contrast
Comparison: 12/31/2019

CLINICAL DATA: Fever, cough

EXAM:
PORTABLE CHEST 1 VIEW

[chest ap]
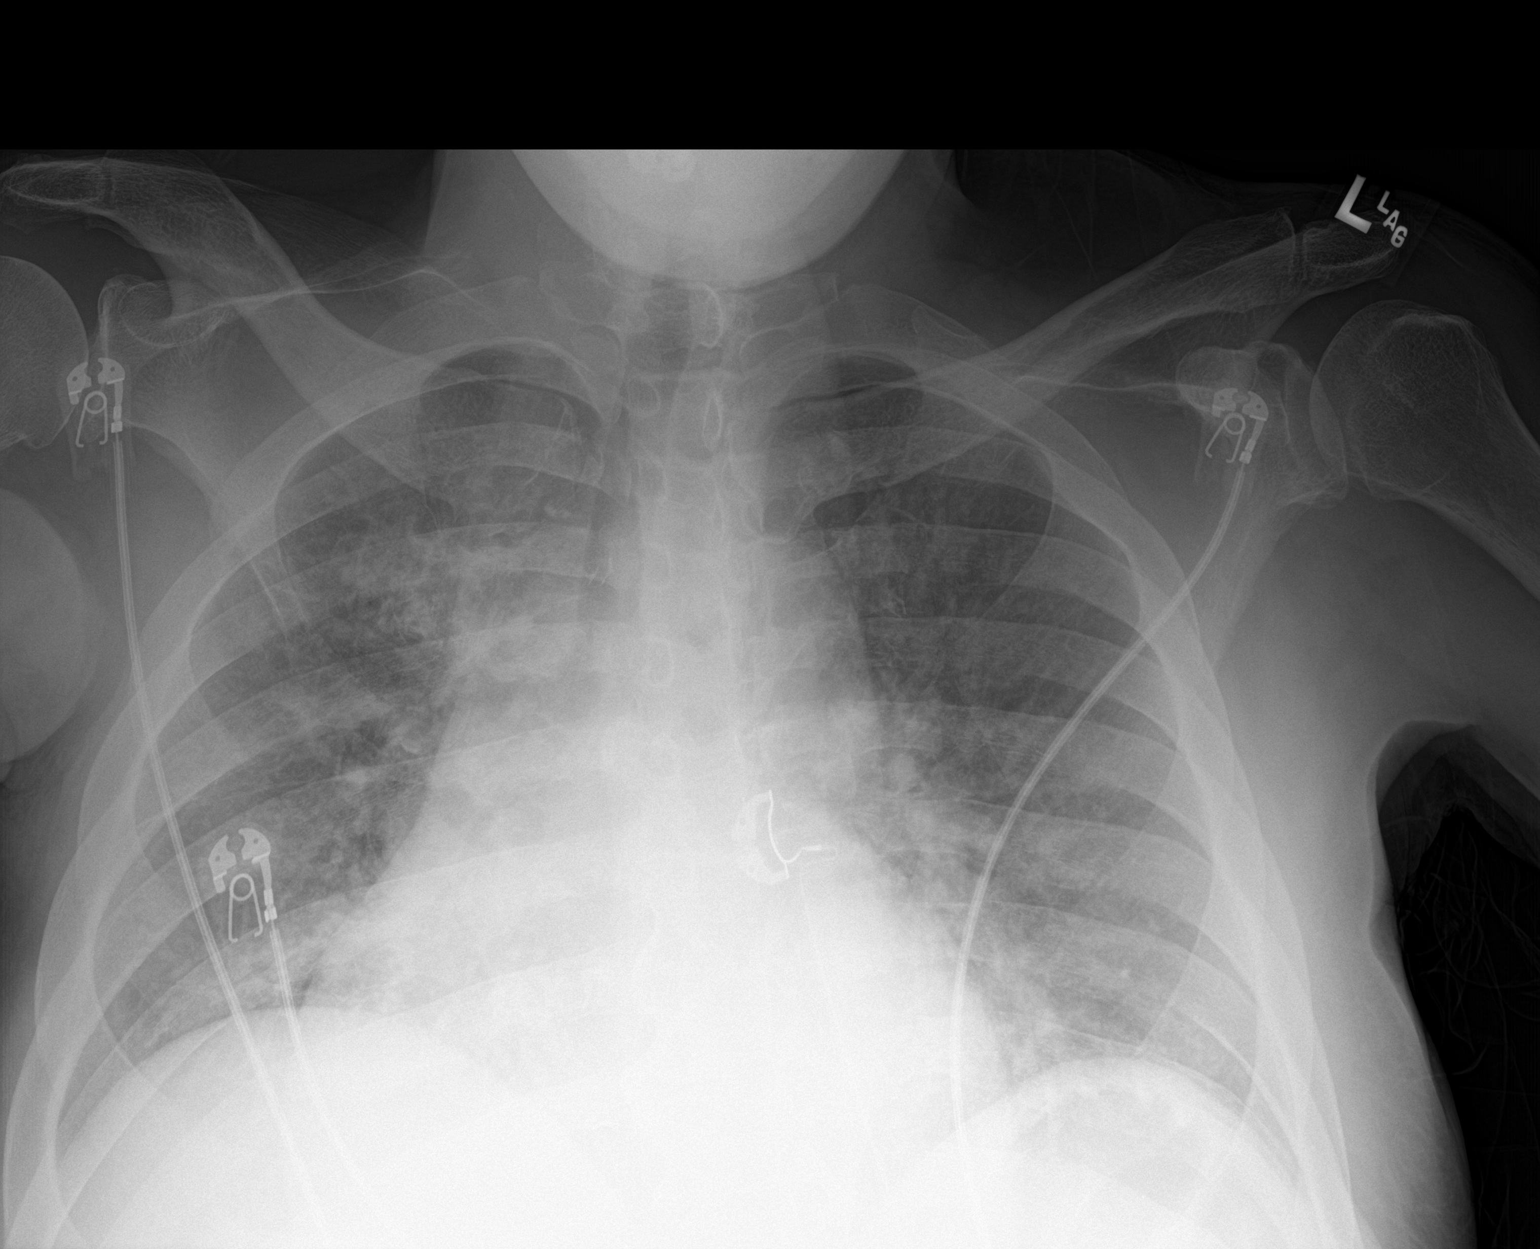

[1 of 1 positions shown; findings below may reference images not displayed]

FINDINGS: Cardiomegaly with vascular congestion. Bilateral interstitial
prominence. Patchy airspace disease in the right lung and left base.
Findings could reflect edema/CHF or infection. No effusions or
pneumothorax. No acute bony abnormality.
IMPRESSION: Cardiomegaly with vascular congestion, interstitial prominence and
patchy bilateral airspace disease, edema versus infection.

## 2021-11-17 DIAGNOSIS — E118 Type 2 diabetes mellitus with unspecified complications: Secondary | ICD-10-CM | POA: Diagnosis not present

## 2021-11-17 DIAGNOSIS — M6281 Muscle weakness (generalized): Secondary | ICD-10-CM | POA: Diagnosis not present

## 2021-11-19 DIAGNOSIS — E118 Type 2 diabetes mellitus with unspecified complications: Secondary | ICD-10-CM | POA: Diagnosis not present

## 2021-11-19 DIAGNOSIS — M6281 Muscle weakness (generalized): Secondary | ICD-10-CM | POA: Diagnosis not present

## 2021-11-20 DIAGNOSIS — E118 Type 2 diabetes mellitus with unspecified complications: Secondary | ICD-10-CM | POA: Diagnosis not present

## 2021-11-20 DIAGNOSIS — M6281 Muscle weakness (generalized): Secondary | ICD-10-CM | POA: Diagnosis not present

## 2021-11-21 DIAGNOSIS — E118 Type 2 diabetes mellitus with unspecified complications: Secondary | ICD-10-CM | POA: Diagnosis not present

## 2021-11-21 DIAGNOSIS — M6281 Muscle weakness (generalized): Secondary | ICD-10-CM | POA: Diagnosis not present

## 2021-11-21 DIAGNOSIS — R69 Illness, unspecified: Secondary | ICD-10-CM | POA: Diagnosis not present

## 2021-11-21 DIAGNOSIS — F321 Major depressive disorder, single episode, moderate: Secondary | ICD-10-CM | POA: Diagnosis not present

## 2021-11-22 DIAGNOSIS — E118 Type 2 diabetes mellitus with unspecified complications: Secondary | ICD-10-CM | POA: Diagnosis not present

## 2021-11-22 DIAGNOSIS — M6281 Muscle weakness (generalized): Secondary | ICD-10-CM | POA: Diagnosis not present

## 2021-11-23 DIAGNOSIS — E118 Type 2 diabetes mellitus with unspecified complications: Secondary | ICD-10-CM | POA: Diagnosis not present

## 2021-11-23 DIAGNOSIS — M6281 Muscle weakness (generalized): Secondary | ICD-10-CM | POA: Diagnosis not present

## 2021-11-26 DIAGNOSIS — R29810 Facial weakness: Secondary | ICD-10-CM | POA: Diagnosis not present

## 2021-11-26 DIAGNOSIS — E118 Type 2 diabetes mellitus with unspecified complications: Secondary | ICD-10-CM | POA: Diagnosis not present

## 2021-11-26 DIAGNOSIS — M6281 Muscle weakness (generalized): Secondary | ICD-10-CM | POA: Diagnosis not present

## 2021-11-26 DIAGNOSIS — H401123 Primary open-angle glaucoma, left eye, severe stage: Secondary | ICD-10-CM | POA: Diagnosis not present

## 2021-11-26 DIAGNOSIS — E113513 Type 2 diabetes mellitus with proliferative diabetic retinopathy with macular edema, bilateral: Secondary | ICD-10-CM | POA: Diagnosis not present

## 2021-11-26 DIAGNOSIS — H2512 Age-related nuclear cataract, left eye: Secondary | ICD-10-CM | POA: Diagnosis not present

## 2021-11-26 DIAGNOSIS — E113593 Type 2 diabetes mellitus with proliferative diabetic retinopathy without macular edema, bilateral: Secondary | ICD-10-CM | POA: Diagnosis not present

## 2021-11-26 DIAGNOSIS — H2513 Age-related nuclear cataract, bilateral: Secondary | ICD-10-CM | POA: Diagnosis not present

## 2021-11-26 DIAGNOSIS — Z794 Long term (current) use of insulin: Secondary | ICD-10-CM | POA: Diagnosis not present

## 2021-11-26 DIAGNOSIS — H4313 Vitreous hemorrhage, bilateral: Secondary | ICD-10-CM | POA: Diagnosis not present

## 2021-11-26 DIAGNOSIS — Z888 Allergy status to other drugs, medicaments and biological substances status: Secondary | ICD-10-CM | POA: Diagnosis not present

## 2021-11-26 DIAGNOSIS — E1136 Type 2 diabetes mellitus with diabetic cataract: Secondary | ICD-10-CM | POA: Diagnosis not present

## 2021-11-26 DIAGNOSIS — H4051X3 Glaucoma secondary to other eye disorders, right eye, severe stage: Secondary | ICD-10-CM | POA: Diagnosis not present

## 2021-11-26 DIAGNOSIS — H35372 Puckering of macula, left eye: Secondary | ICD-10-CM | POA: Diagnosis not present

## 2021-11-26 DIAGNOSIS — H4312 Vitreous hemorrhage, left eye: Secondary | ICD-10-CM | POA: Diagnosis not present

## 2021-11-26 DIAGNOSIS — E113512 Type 2 diabetes mellitus with proliferative diabetic retinopathy with macular edema, left eye: Secondary | ICD-10-CM | POA: Diagnosis not present

## 2021-11-27 DIAGNOSIS — I10 Essential (primary) hypertension: Secondary | ICD-10-CM | POA: Diagnosis not present

## 2021-11-27 DIAGNOSIS — E118 Type 2 diabetes mellitus with unspecified complications: Secondary | ICD-10-CM | POA: Diagnosis not present

## 2021-11-27 DIAGNOSIS — J45991 Cough variant asthma: Secondary | ICD-10-CM | POA: Diagnosis not present

## 2021-11-27 DIAGNOSIS — M6281 Muscle weakness (generalized): Secondary | ICD-10-CM | POA: Diagnosis not present

## 2021-11-27 DIAGNOSIS — E782 Mixed hyperlipidemia: Secondary | ICD-10-CM | POA: Diagnosis not present

## 2021-11-27 DIAGNOSIS — E1165 Type 2 diabetes mellitus with hyperglycemia: Secondary | ICD-10-CM | POA: Diagnosis not present

## 2021-11-28 DIAGNOSIS — E118 Type 2 diabetes mellitus with unspecified complications: Secondary | ICD-10-CM | POA: Diagnosis not present

## 2021-11-28 DIAGNOSIS — M6281 Muscle weakness (generalized): Secondary | ICD-10-CM | POA: Diagnosis not present

## 2021-11-28 DIAGNOSIS — R69 Illness, unspecified: Secondary | ICD-10-CM | POA: Diagnosis not present

## 2021-11-28 DIAGNOSIS — F321 Major depressive disorder, single episode, moderate: Secondary | ICD-10-CM | POA: Diagnosis not present

## 2021-11-29 DIAGNOSIS — E118 Type 2 diabetes mellitus with unspecified complications: Secondary | ICD-10-CM | POA: Diagnosis not present

## 2021-11-29 DIAGNOSIS — M6281 Muscle weakness (generalized): Secondary | ICD-10-CM | POA: Diagnosis not present

## 2021-11-29 DIAGNOSIS — I10 Essential (primary) hypertension: Secondary | ICD-10-CM | POA: Diagnosis not present

## 2021-11-30 DIAGNOSIS — M6281 Muscle weakness (generalized): Secondary | ICD-10-CM | POA: Diagnosis not present

## 2021-11-30 DIAGNOSIS — E118 Type 2 diabetes mellitus with unspecified complications: Secondary | ICD-10-CM | POA: Diagnosis not present

## 2021-12-03 DIAGNOSIS — M6281 Muscle weakness (generalized): Secondary | ICD-10-CM | POA: Diagnosis not present

## 2021-12-03 DIAGNOSIS — E118 Type 2 diabetes mellitus with unspecified complications: Secondary | ICD-10-CM | POA: Diagnosis not present

## 2021-12-04 DIAGNOSIS — E118 Type 2 diabetes mellitus with unspecified complications: Secondary | ICD-10-CM | POA: Diagnosis not present

## 2021-12-04 DIAGNOSIS — M6281 Muscle weakness (generalized): Secondary | ICD-10-CM | POA: Diagnosis not present

## 2021-12-05 DIAGNOSIS — M6281 Muscle weakness (generalized): Secondary | ICD-10-CM | POA: Diagnosis not present

## 2021-12-05 DIAGNOSIS — E118 Type 2 diabetes mellitus with unspecified complications: Secondary | ICD-10-CM | POA: Diagnosis not present

## 2021-12-05 LAB — ACID FAST CULTURE WITH REFLEXED SENSITIVITIES (MYCOBACTERIA): Acid Fast Culture: NEGATIVE

## 2021-12-06 DIAGNOSIS — R69 Illness, unspecified: Secondary | ICD-10-CM | POA: Diagnosis not present

## 2021-12-06 DIAGNOSIS — E118 Type 2 diabetes mellitus with unspecified complications: Secondary | ICD-10-CM | POA: Diagnosis not present

## 2021-12-06 DIAGNOSIS — M6281 Muscle weakness (generalized): Secondary | ICD-10-CM | POA: Diagnosis not present

## 2021-12-06 DIAGNOSIS — F321 Major depressive disorder, single episode, moderate: Secondary | ICD-10-CM | POA: Diagnosis not present

## 2021-12-08 ENCOUNTER — Other Ambulatory Visit: Payer: Self-pay

## 2021-12-08 ENCOUNTER — Emergency Department (HOSPITAL_COMMUNITY): Payer: Medicare HMO

## 2021-12-08 ENCOUNTER — Inpatient Hospital Stay (HOSPITAL_COMMUNITY)
Admission: EM | Admit: 2021-12-08 | Discharge: 2021-12-11 | DRG: 872 | Disposition: A | Discharge status: Skilled Nursing Facility | Payer: Medicare HMO | Source: Skilled Nursing Facility | Attending: Internal Medicine | Admitting: Internal Medicine

## 2021-12-08 DIAGNOSIS — Z87891 Personal history of nicotine dependence: Secondary | ICD-10-CM

## 2021-12-08 DIAGNOSIS — Z1152 Encounter for screening for COVID-19: Secondary | ICD-10-CM

## 2021-12-08 DIAGNOSIS — Z86718 Personal history of other venous thrombosis and embolism: Secondary | ICD-10-CM

## 2021-12-08 DIAGNOSIS — I129 Hypertensive chronic kidney disease with stage 1 through stage 4 chronic kidney disease, or unspecified chronic kidney disease: Secondary | ICD-10-CM

## 2021-12-08 DIAGNOSIS — E785 Hyperlipidemia, unspecified: Secondary | ICD-10-CM | POA: Diagnosis present

## 2021-12-08 DIAGNOSIS — I5032 Chronic diastolic (congestive) heart failure: Secondary | ICD-10-CM | POA: Diagnosis present

## 2021-12-08 DIAGNOSIS — Z743 Need for continuous supervision: Secondary | ICD-10-CM | POA: Diagnosis not present

## 2021-12-08 DIAGNOSIS — J189 Pneumonia, unspecified organism: Secondary | ICD-10-CM | POA: Diagnosis present

## 2021-12-08 DIAGNOSIS — E1169 Type 2 diabetes mellitus with other specified complication: Secondary | ICD-10-CM

## 2021-12-08 DIAGNOSIS — E1151 Type 2 diabetes mellitus with diabetic peripheral angiopathy without gangrene: Secondary | ICD-10-CM | POA: Diagnosis present

## 2021-12-08 DIAGNOSIS — R000 Tachycardia, unspecified: Secondary | ICD-10-CM | POA: Diagnosis not present

## 2021-12-08 DIAGNOSIS — F32A Depression, unspecified: Secondary | ICD-10-CM | POA: Diagnosis present

## 2021-12-08 DIAGNOSIS — R7881 Bacteremia: Secondary | ICD-10-CM | POA: Diagnosis not present

## 2021-12-08 DIAGNOSIS — I2721 Secondary pulmonary arterial hypertension: Secondary | ICD-10-CM | POA: Diagnosis present

## 2021-12-08 DIAGNOSIS — I130 Hypertensive heart and chronic kidney disease with heart failure and stage 1 through stage 4 chronic kidney disease, or unspecified chronic kidney disease: Secondary | ICD-10-CM | POA: Diagnosis present

## 2021-12-08 DIAGNOSIS — I10 Essential (primary) hypertension: Secondary | ICD-10-CM | POA: Diagnosis present

## 2021-12-08 DIAGNOSIS — J811 Chronic pulmonary edema: Secondary | ICD-10-CM | POA: Diagnosis not present

## 2021-12-08 DIAGNOSIS — Z794 Long term (current) use of insulin: Secondary | ICD-10-CM

## 2021-12-08 DIAGNOSIS — L89890 Pressure ulcer of other site, unstageable: Secondary | ICD-10-CM | POA: Diagnosis present

## 2021-12-08 DIAGNOSIS — E119 Type 2 diabetes mellitus without complications: Secondary | ICD-10-CM

## 2021-12-08 DIAGNOSIS — I739 Peripheral vascular disease, unspecified: Secondary | ICD-10-CM | POA: Diagnosis present

## 2021-12-08 DIAGNOSIS — Z89512 Acquired absence of left leg below knee: Secondary | ICD-10-CM

## 2021-12-08 DIAGNOSIS — I69351 Hemiplegia and hemiparesis following cerebral infarction affecting right dominant side: Secondary | ICD-10-CM

## 2021-12-08 DIAGNOSIS — R112 Nausea with vomiting, unspecified: Secondary | ICD-10-CM | POA: Diagnosis not present

## 2021-12-08 DIAGNOSIS — Z79899 Other long term (current) drug therapy: Secondary | ICD-10-CM

## 2021-12-08 DIAGNOSIS — B952 Enterococcus as the cause of diseases classified elsewhere: Secondary | ICD-10-CM | POA: Diagnosis present

## 2021-12-08 DIAGNOSIS — Z91018 Allergy to other foods: Secondary | ICD-10-CM

## 2021-12-08 DIAGNOSIS — E1122 Type 2 diabetes mellitus with diabetic chronic kidney disease: Secondary | ICD-10-CM | POA: Diagnosis present

## 2021-12-08 DIAGNOSIS — N400 Enlarged prostate without lower urinary tract symptoms: Secondary | ICD-10-CM | POA: Diagnosis present

## 2021-12-08 DIAGNOSIS — N184 Chronic kidney disease, stage 4 (severe): Secondary | ICD-10-CM | POA: Diagnosis present

## 2021-12-08 DIAGNOSIS — I6930 Unspecified sequelae of cerebral infarction: Secondary | ICD-10-CM

## 2021-12-08 DIAGNOSIS — J168 Pneumonia due to other specified infectious organisms: Secondary | ICD-10-CM | POA: Diagnosis not present

## 2021-12-08 DIAGNOSIS — Z7982 Long term (current) use of aspirin: Secondary | ICD-10-CM

## 2021-12-08 DIAGNOSIS — F319 Bipolar disorder, unspecified: Secondary | ICD-10-CM | POA: Diagnosis present

## 2021-12-08 DIAGNOSIS — F418 Other specified anxiety disorders: Secondary | ICD-10-CM | POA: Diagnosis present

## 2021-12-08 DIAGNOSIS — R509 Fever, unspecified: Secondary | ICD-10-CM | POA: Diagnosis not present

## 2021-12-08 LAB — CBC WITH DIFFERENTIAL/PLATELET
Abs Immature Granulocytes: 0.05 10*3/uL (ref 0.00–0.07)
Basophils Absolute: 0 10*3/uL (ref 0.0–0.1)
Basophils Relative: 0 %
Eosinophils Absolute: 0 10*3/uL (ref 0.0–0.5)
Eosinophils Relative: 0 %
HCT: 33 % — ABNORMAL LOW (ref 39.0–52.0)
Hemoglobin: 10.1 g/dL — ABNORMAL LOW (ref 13.0–17.0)
Immature Granulocytes: 1 %
Lymphocytes Relative: 6 %
Lymphs Abs: 0.6 10*3/uL — ABNORMAL LOW (ref 0.7–4.0)
MCH: 25.2 pg — ABNORMAL LOW (ref 26.0–34.0)
MCHC: 30.6 g/dL (ref 30.0–36.0)
MCV: 82.3 fL (ref 80.0–100.0)
Monocytes Absolute: 0.4 10*3/uL (ref 0.1–1.0)
Monocytes Relative: 4 %
Neutro Abs: 8.7 10*3/uL — ABNORMAL HIGH (ref 1.7–7.7)
Neutrophils Relative %: 89 %
Platelets: 188 10*3/uL (ref 150–400)
RBC: 4.01 MIL/uL — ABNORMAL LOW (ref 4.22–5.81)
RDW: 17 % — ABNORMAL HIGH (ref 11.5–15.5)
WBC: 9.8 10*3/uL (ref 4.0–10.5)
nRBC: 0 % (ref 0.0–0.2)

## 2021-12-08 LAB — COMPREHENSIVE METABOLIC PANEL
ALT: 23 U/L (ref 0–44)
AST: 23 U/L (ref 15–41)
Albumin: 2.6 g/dL — ABNORMAL LOW (ref 3.5–5.0)
Alkaline Phosphatase: 124 U/L (ref 38–126)
Anion gap: 4 — ABNORMAL LOW (ref 5–15)
BUN: 39 mg/dL — ABNORMAL HIGH (ref 6–20)
CO2: 20 mmol/L — ABNORMAL LOW (ref 22–32)
Calcium: 8.4 mg/dL — ABNORMAL LOW (ref 8.9–10.3)
Chloride: 115 mmol/L — ABNORMAL HIGH (ref 98–111)
Creatinine, Ser: 2.53 mg/dL — ABNORMAL HIGH (ref 0.61–1.24)
GFR, Estimated: 28 mL/min — ABNORMAL LOW (ref >60)
Glucose, Bld: 126 mg/dL — ABNORMAL HIGH (ref 70–99)
Potassium: 5.1 mmol/L (ref 3.5–5.1)
Sodium: 139 mmol/L (ref 135–145)
Total Bilirubin: 0.6 mg/dL (ref 0.3–1.2)
Total Protein: 7.1 g/dL (ref 6.5–8.1)

## 2021-12-08 LAB — BRAIN NATRIURETIC PEPTIDE: B Natriuretic Peptide: 401.2 pg/mL — ABNORMAL HIGH (ref 0.0–100.0)

## 2021-12-08 LAB — PROTIME-INR
INR: 1.3 — ABNORMAL HIGH (ref 0.8–1.2)
Prothrombin Time: 15.7 seconds — ABNORMAL HIGH (ref 11.4–15.2)

## 2021-12-08 LAB — LACTIC ACID, PLASMA
Lactic Acid, Venous: 1 mmol/L (ref 0.5–1.9)
Lactic Acid, Venous: 0.8 mmol/L (ref 0.5–1.9)

## 2021-12-08 LAB — CBG MONITORING, ED: Glucose-Capillary: 118 mg/dL — ABNORMAL HIGH (ref 70–99)

## 2021-12-08 LAB — SARS CORONAVIRUS 2 BY RT PCR: SARS Coronavirus 2 by RT PCR: NEGATIVE

## 2021-12-08 MED ORDER — SODIUM CHLORIDE 0.9 % IV SOLN
2.0000 g | INTRAVENOUS | Status: DC
  Administered 2021-12-09: 2 g via INTRAVENOUS
  Filled 2021-12-08: qty 20

## 2021-12-08 MED ORDER — LACTATED RINGERS IV BOLUS
500.0000 mL | Freq: Once | INTRAVENOUS | Status: AC
  Administered 2021-12-08: 500 mL via INTRAVENOUS

## 2021-12-08 MED ORDER — SENNOSIDES-DOCUSATE SODIUM 8.6-50 MG PO TABS: 1.0000 | ORAL_TABLET | Freq: Every evening | ORAL | Status: DC | PRN

## 2021-12-08 MED ORDER — ACETAMINOPHEN 500 MG PO TABS
1000.0000 mg | ORAL_TABLET | Freq: Once | ORAL | Status: AC
  Administered 2021-12-08: 1000 mg via ORAL
  Filled 2021-12-08: qty 2

## 2021-12-08 MED ORDER — SODIUM BICARBONATE 650 MG PO TABS
1300.0000 mg | ORAL_TABLET | Freq: Two times a day (BID) | ORAL | Status: DC
  Administered 2021-12-09 – 2021-12-11 (×5): 1300 mg via ORAL
  Filled 2021-12-08 (×5): qty 2

## 2021-12-08 MED ORDER — ONDANSETRON HCL 4 MG PO TABS: 4.0000 mg | ORAL_TABLET | Freq: Four times a day (QID) | ORAL | Status: DC | PRN

## 2021-12-08 MED ORDER — DOCUSATE SODIUM 100 MG PO CAPS
100.0000 mg | ORAL_CAPSULE | Freq: Every day | ORAL | Status: DC
  Administered 2021-12-10: 100 mg via ORAL
  Filled 2021-12-08 (×3): qty 1

## 2021-12-08 MED ORDER — ASPIRIN 81 MG PO CHEW
81.0000 mg | CHEWABLE_TABLET | Freq: Every day | ORAL | Status: DC
  Administered 2021-12-09 – 2021-12-11 (×3): 81 mg via ORAL
  Filled 2021-12-08 (×3): qty 1

## 2021-12-08 MED ORDER — FUROSEMIDE 40 MG PO TABS
80.0000 mg | ORAL_TABLET | Freq: Two times a day (BID) | ORAL | Status: DC
  Administered 2021-12-09 – 2021-12-11 (×5): 80 mg via ORAL
  Filled 2021-12-08 (×5): qty 2

## 2021-12-08 MED ORDER — AMLODIPINE BESYLATE 5 MG PO TABS
5.0000 mg | ORAL_TABLET | Freq: Every day | ORAL | Status: DC
  Administered 2021-12-09 – 2021-12-10 (×3): 5 mg via ORAL
  Filled 2021-12-08 (×3): qty 1

## 2021-12-08 MED ORDER — PANTOPRAZOLE SODIUM 40 MG PO TBEC
40.0000 mg | DELAYED_RELEASE_TABLET | Freq: Every day | ORAL | Status: DC
  Administered 2021-12-09 – 2021-12-11 (×3): 40 mg via ORAL
  Filled 2021-12-08 (×3): qty 1

## 2021-12-08 MED ORDER — SODIUM CHLORIDE 0.9 % IV SOLN
2.0000 g | Freq: Two times a day (BID) | INTRAVENOUS | Status: DC
  Administered 2021-12-08: 2 g via INTRAVENOUS
  Filled 2021-12-08: qty 12.5

## 2021-12-08 MED ORDER — AMLODIPINE BESYLATE 5 MG PO TABS: 5.0000 mg | ORAL_TABLET | Freq: Every day | ORAL | Status: DC

## 2021-12-08 MED ORDER — ACETAMINOPHEN 325 MG PO TABS
650.0000 mg | ORAL_TABLET | Freq: Four times a day (QID) | ORAL | Status: DC | PRN
  Administered 2021-12-09 – 2021-12-11 (×3): 650 mg via ORAL
  Filled 2021-12-08 (×3): qty 2

## 2021-12-08 MED ORDER — ATORVASTATIN CALCIUM 40 MG PO TABS
80.0000 mg | ORAL_TABLET | Freq: Every day | ORAL | Status: DC
  Administered 2021-12-09 – 2021-12-10 (×3): 80 mg via ORAL
  Filled 2021-12-08 (×3): qty 2

## 2021-12-08 MED ORDER — SODIUM CHLORIDE 0.9 % IV SOLN
500.0000 mg | INTRAVENOUS | Status: DC
  Administered 2021-12-09: 500 mg via INTRAVENOUS
  Filled 2021-12-08: qty 5

## 2021-12-08 MED ORDER — TAMSULOSIN HCL 0.4 MG PO CAPS
0.8000 mg | ORAL_CAPSULE | Freq: Every evening | ORAL | Status: DC
  Administered 2021-12-09 – 2021-12-10 (×3): 0.8 mg via ORAL
  Filled 2021-12-08 (×3): qty 2

## 2021-12-08 MED ORDER — HEPARIN SODIUM (PORCINE) 5000 UNIT/ML IJ SOLN
5000.0000 [IU] | Freq: Three times a day (TID) | INTRAMUSCULAR | Status: DC
  Administered 2021-12-09 – 2021-12-11 (×7): 5000 [IU] via SUBCUTANEOUS
  Filled 2021-12-08 (×7): qty 1

## 2021-12-08 MED ORDER — HYDRALAZINE HCL 20 MG/ML IJ SOLN: 10.0000 mg | INTRAMUSCULAR | Status: DC | PRN

## 2021-12-08 MED ORDER — ACETAMINOPHEN 650 MG RE SUPP: 650.0000 mg | Freq: Four times a day (QID) | RECTAL | Status: DC | PRN

## 2021-12-08 MED ORDER — SERTRALINE HCL 50 MG PO TABS
50.0000 mg | ORAL_TABLET | Freq: Every evening | ORAL | Status: DC
  Administered 2021-12-09 – 2021-12-10 (×2): 50 mg via ORAL
  Filled 2021-12-08 (×2): qty 1

## 2021-12-08 MED ORDER — INSULIN ASPART 100 UNIT/ML IJ SOLN
0.0000 [IU] | Freq: Three times a day (TID) | INTRAMUSCULAR | Status: DC
  Administered 2021-12-09: 2 [IU] via SUBCUTANEOUS
  Administered 2021-12-10: 1 [IU] via SUBCUTANEOUS
  Administered 2021-12-10 – 2021-12-11 (×2): 2 [IU] via SUBCUTANEOUS
  Filled 2021-12-08: qty 0.09

## 2021-12-08 MED ORDER — ONDANSETRON HCL 4 MG/2ML IJ SOLN: 4.0000 mg | Freq: Four times a day (QID) | INTRAMUSCULAR | Status: DC | PRN

## 2021-12-08 MED ORDER — SODIUM CHLORIDE 0.9% FLUSH
3.0000 mL | Freq: Two times a day (BID) | INTRAVENOUS | Status: DC
  Administered 2021-12-09 – 2021-12-11 (×6): 3 mL via INTRAVENOUS

## 2021-12-08 NOTE — Assessment & Plan Note (Signed)
Patient diaphoretic with chills.  CXR suggestive of RUL pneumonia.  COVID is negative.  Slightly elevated pulse and respiratory rate on arrival but not with subsequent readings.  Sepsis not ruled in on admission. -IV ceftriaxone and azithromycin -Supplemental oxygen as needed -Follow blood cultures

## 2021-12-08 NOTE — H&P (Signed)
History and Physical    [name] KNL:[id number] DOB: [birth year] DOA: 12/08/2021  PCP: Jodi Marble, MD  Patient coming from: [facility] facility  I have personally briefly reviewed patient's old medical records in [place]  Chief Complaint: Fever and chills  HPI: [name] is a 61 y.o. male with medical history significant for HFpEF (EF 60-65%, G2DD by TTE 10/21/2021), CKD stage IV, history of CVA with right hemiparesis, IDT2DM, HTN, HLD, PAD s/p left BKA, iron deficiency anemia, depression/anxiety who presented to the ED from SNF for evaluation of fever and chills.  Patient reports new onset of chills and diaphoresis beginning yesterday.  He had an episode of nausea with vomiting but is not sure if he aspirated.  He is not sure if he has had any fevers.  He denies any chest pain, dyspnea, cough, abdominal pain.  He reports good urine output.  He reports chronic right-sided weakness due to prior stroke which is unchanged.  ED Course  Labs/Imaging on admission: I have personally reviewed following labs and imaging studies.  Initial vitals showed BP 179/94, pulse 105, RR 22, temp 99.6 F, SPO2 98% on room air.  Next  Labs show WBC 9.8, hemoglobin 10.1, platelets 188,000, sodium 139, potassium 5.1, bicarb 20, BUN 39, creatinine 2.53, serum glucose 126, LFTs within normal limits, lactic acid 1.0.  SARS-CoV-2 PCR negative.  Blood cultures in process.  Urinalysis pending collection.  2 view chest x-ray showed cardiomegaly with central pulmonary vascular congestion and patchy airspace opacities in the right upper lobe.  Patient was given 500 cc LR and IV cefepime.  The hospitalist service was consulted to admit for further evaluation and management.  Review of Systems: All systems reviewed and are negative except as documented in history of present illness above.   Past Medical History:  Diagnosis Date   Arthritis    Bipolar affective ([place])    CHF  (congestive heart failure) ([place])    Claustrophobia    Severe   Depression    Diabetes mellitus    uncontrolled    Heart murmur    Hypertension     Past Surgical History:  Procedure Laterality Date   ABDOMINAL AORTOGRAM W/LOWER EXTREMITY Left 03/02/2021   Procedure: ABDOMINAL AORTOGRAM W/LOWER EXTREMITY;  Surgeon: Cherre Robins, MD;  Location: [facility];  Service: Cardiovascular;  Laterality: Left;   AMPUTATION Left 03/07/2021   Procedure: LEFT BELOW KNEE AMPUTATION;  Surgeon: Cherre Robins, MD;  Location: [facility];  Service: Vascular;  Laterality: Left;   APPLICATION OF WOUND VAC Left 03/07/2021   Procedure: APPLICATION OF WOUND VAC;  Surgeon: Cherre Robins, MD;  Location: [facility];  Service: Vascular;  Laterality: Left;   BOWEL DECOMPRESSION N/A 01/19/2020   Procedure: BOWEL DECOMPRESSION;  Surgeon: Otis Brace, MD;  Location: [facility];  Service: Gastroenterology;  Laterality: N/A;   BOWEL DECOMPRESSION N/A 11/19/2020   Procedure: BOWEL DECOMPRESSION;  Surgeon: Wilford Corner, MD;  Location: [facility];  Service: Endoscopy;  Laterality: N/A;   BOWEL DECOMPRESSION N/A 12/03/2020   Procedure: BOWEL DECOMPRESSION;  Surgeon: Ronnette Juniper, MD;  Location: [facility];  Service: Gastroenterology;  Laterality: N/A;   COLONOSCOPY WITH PROPOFOL N/A 01/19/2020   Procedure: COLONOSCOPY WITH PROPOFOL;  Surgeon: Otis Brace, MD;  Location: [facility];  Service: Gastroenterology;  Laterality: N/A;   COLONOSCOPY WITH PROPOFOL N/A 11/19/2020   Procedure: COLONOSCOPY WITH PROPOFOL;  Surgeon: Wilford Corner, MD;  Location: [facility];  Service: Endoscopy;  Laterality: N/A;   COLONOSCOPY WITH PROPOFOL N/A 12/03/2020   Procedure: COLONOSCOPY WITH PROPOFOL;  Surgeon: Ronnette Juniper, MD;  Location: [facility];  Service: Gastroenterology;  Laterality: N/A;   KNEE SURGERY     b/l knees; pending total knee    RADIOLOGY WITH ANESTHESIA N/A 07/04/2020   Procedure: MRI WITH  ANESTHESIA BRAIN WITHOUT CONTRAST AND HEAD WITHOUT CONTRAST;  Surgeon: Radiologist, Medication, MD;  Location: [facility];  Service: Radiology;  Laterality: N/A;   TRANSMETATARSAL AMPUTATION Left 03/01/2021   Procedure: TRANSMETATARSAL AMPUTATION;  Surgeon: Trula Slade, DPM;  Location: [facility];  Service: Podiatry;  Laterality: Left;    Social History:  reports that he has quit smoking. He has never used smokeless tobacco. He reports that he does not currently use drugs after having used the following drugs: Cocaine. He reports that he does not drink alcohol.  Allergies  Allergen Reactions   Other Anaphylaxis, Itching, Swelling and Other (See Comments)    Grits - swelling    Family History  Problem Relation Age of Onset   Diabetes Other    Cancer Mother        uterus; deceased    Heart attack Mother 31   Hypertension Other    Heart attack Father 81   Stroke Sister        12/2012 age 60 y.o     Prior to Admission medications   Medication Sig Start Date End Date Taking? Authorizing Provider  acetaminophen (TYLENOL) 650 MG CR tablet Take 650 mg by mouth every 4 (four) hours as needed (general discomfort).    [provider]  albuterol (VENTOLIN HFA) 108 (90 Base) MCG/ACT inhaler Inhale 2 puffs into the lungs every 6 (six) hours as needed for wheezing or shortness of breath.    [provider]  amLODipine (NORVASC) 10 MG tablet Take 0.5 tablets (5 mg total) by mouth daily. Patient taking differently: Take 10 mg by mouth 2 (two) times daily. 03/12/21   Aline August, MD  ascorbic acid (VITAMIN C) 500 MG tablet Take 500 mg by mouth 2 (two) times daily.    [provider]  aspirin 81 MG chewable tablet Chew 81 mg by mouth daily.    [provider]  atorvastatin (LIPITOR) 80 MG tablet Take 80 mg by mouth at bedtime.    [provider]  brimonidine (ALPHAGAN) 0.2 % ophthalmic solution Place 1 drop into both eyes 2 (two) times daily.    [provider]  calcium acetate (PHOSLO) 667 MG capsule Take 1 capsule (667 mg total) by mouth 3 (three) times daily with meals. 10/31/21   Erskine Emery, MD  docusate sodium (COLACE) 100 MG capsule Take 100 mg by mouth at bedtime.    [provider]  dorzolamide-timolol (COSOPT) 22.3-6.8 MG/ML ophthalmic solution Place 1 drop into both eyes 2 (two) times daily.    [provider]  ferrous sulfate 325 (65 FE) MG tablet Take 1 tablet (325 mg total) by mouth daily. 09/23/20   Raiford Noble Latif, DO  furosemide (LASIX) 80 MG tablet Take 1 tablet (80 mg total) by mouth 2 (two) times daily. 10/31/21   Erskine Emery, MD  latanoprost (XALATAN) 0.005 % ophthalmic solution Place 1 drop into both eyes at bedtime.    [provider]  Magnesium Oxide 400 MG CAPS Take 1 capsule (400 mg total) by mouth daily. 11/01/21   Erskine Emery, MD  Multiple Vitamin (MULTIVITAMIN) tablet Take 1 tablet by mouth daily.  [provider]  multivitamin (RENA-VIT) TABS tablet Take 1 tablet by mouth at bedtime. 10/31/21   Erskine Emery, MD  Nutritional Supplements (FEEDING SUPPLEMENT, NEPRO CARB STEADY,) LIQD Take 237 mLs by mouth at bedtime. 10/31/21   Erskine Emery, MD  ondansetron (ZOFRAN) 4 MG tablet Take 4 mg by mouth every 4 (four) hours as needed for nausea or vomiting.    [provider]  pantoprazole (PROTONIX) 40 MG tablet Take 1 tablet (40 mg total) by mouth daily. 10/31/21   Erskine Emery, MD  potassium chloride SA (KLOR-CON M) 20 MEQ tablet Take 1 tablet (20 mEq total) by mouth 2 (two) times daily. 11/04/21   Erskine Emery, MD  potassium chloride SA (KLOR-CON M) 20 MEQ tablet Take 2 tablets (40 mEq total) by mouth 2 (two) times daily for 2 days. 11/01/21 11/03/21  Erskine Emery, MD  pregabalin (LYRICA) 75 MG capsule Take 1 capsule (75 mg total) by mouth daily. 10/31/21   Erskine Emery, MD  PROTEIN PO Take 30 mLs by mouth 2 (two) times daily.    [provider]   saccharomyces boulardii (FLORASTOR) 250 MG capsule Take 1 capsule (250 mg total) by mouth 2 (two) times daily. 09/07/20   Antonieta Pert, MD  sertraline (ZOLOFT) 50 MG tablet Take 50 mg by mouth every evening.    [provider]  sodium bicarbonate 650 MG tablet Take 2 tablets (1,300 mg total) by mouth 2 (two) times daily. 10/31/21   Erskine Emery, MD  tamsulosin (FLOMAX) 0.4 MG CAPS capsule Take 0.8 mg by mouth every evening.    [provider]  Vitamin D, Ergocalciferol, (DRISDOL) 1.25 MG (50000 UNIT) CAPS capsule Take 50,000 Units by mouth every 30 (thirty) days. 17th of each month    [provider]  zinc sulfate 220 (50 Zn) MG capsule Take 220 mg by mouth daily.    [provider]    Physical Exam: Vitals:   12/08/21 1839 12/08/21 1840 12/08/21 1925 12/08/21 2145  BP: (!) 179/94  (!) 164/85 (!) 165/87  Pulse: (!) 105  (!) 102 83  Resp: (!) 22  11 (!) 9  Temp: 99.6 F (37.6 C)     TempSrc: Oral     SpO2: 98%  93% 97%  Weight:  81.6 kg    Height:  '5\' 8"'$  (1.727 m)     Constitutional: Chronically ill-appearing man resting in bed with head elevated, lethargic Eyes: EOMI, lids and conjunctivae normal ENMT: Mucous membranes are moist. Posterior pharynx clear of any exudate or lesions.poor dentition.  Neck: normal, supple, no masses. Respiratory: clear to auscultation anteriorly. Normal respiratory effort. No accessory muscle use.  Cardiovascular: Regular rate and rhythm, no murmurs / rubs / gallops.  +1 right lower extremity edema with chronic stasis dermatitis changes. Abdomen: no tenderness, no masses palpated.  Musculoskeletal: S/p left BKA.  No clubbing / cyanosis. No joint deformity upper and lower extremities.  ROM diminished right upper and lower extremities due to prior stroke. Skin: Stasis dermatitis changes RLE. Neurologic:  Sensation intact. Strength 3/5 RUE, 1/5 RLE, 5/5 LUE and LLE. Psychiatric:  Alert and oriented x 3. Normal mood.   EKG:  Personally reviewed. Sinus tachycardia, rate 106, nonspecific T wave changes lateral leads.  Rate is faster when compared to prior.  Assessment/Plan Principal Problem:   Right upper lobe pneumonia Active Problems:   Chronic heart failure with preserved ejection fraction (HFpEF) (HCC)   CKD (chronic kidney disease) stage 4, GFR 15-29 ml/min (HCC)  Hypertension   Type 2 diabetes mellitus ([place])   History of cerebrovascular accident (CVA) with residual deficit   Depression with anxiety   BPH (benign prostatic hyperplasia)   PAD (peripheral artery disease) ([place])   BRAND SIEVER is a 61 y.o. male with medical history significant for HFpEF (EF 60-65%, G2DD by TTE 10/21/2021), CKD stage IV, history of CVA with right hemiparesis, IDT2DM, HTN, HLD, PAD s/p left BKA, iron deficiency anemia, depression/anxiety who is admitted with right upper lobe pneumonia.  Assessment and Plan: * Right upper lobe pneumonia Patient diaphoretic with chills.  CXR suggestive of RUL pneumonia.  COVID is negative.  Slightly elevated pulse and respiratory rate on arrival but not with subsequent readings.  Sepsis not ruled in on admission. -IV ceftriaxone and azithromycin -Supplemental oxygen as needed -Follow blood cultures  Chronic heart failure with preserved ejection fraction (HFpEF) ([place]) Some pulmonary vascular congestion on CXR.  EF is 60-65% by TTE 10/21/2021.  Currently saturating well on room air. -Resume home Lasix 80 mg BID in a.m. -Strict I/O's and daily weights -Hold K supplement due to borderline high level  CKD (chronic kidney disease) stage 4, GFR 15-29 ml/min (HCC) Renal function slightly improved from recent baseline.  Continue to monitor.  Hypertension Continue amlodipine and Lasix.  Type 2 diabetes mellitus ([place]) Placed on SSI.  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  Continue aspirin, statin.  PAD (peripheral artery  disease) (HCC) S/p left BKA.  Continue aspirin and statin.  BPH (benign prostatic hyperplasia) Continue Flomax.  Depression with anxiety Continue sertraline.  DVT prophylaxis: heparin injection 5,000 Units Start: 12/09/21 0600 Code Status: Full code, confirmed with patient Family Communication: Discussed with patient, he has discussed with family Disposition Plan: From [facility], [place] pending clinical progress Consults called: None Severity of Illness: The appropriate patient status for this patient is OBSERVATION. Observation status is judged to be reasonable and necessary in order to provide the required intensity of service to ensure the patient's safety. The patient's presenting symptoms, physical exam findings, and initial radiographic and laboratory data in the context of their medical condition is felt to place them at decreased risk for further clinical deterioration. Furthermore, it is anticipated that the patient will be medically stable for discharge from the hospital within 2 midnights of admission.   Zada Finders MD [facility]  If 7PM-7AM, please contact night-coverage [email or web address]  12/08/2021, 10:13 PM

## 2021-12-08 NOTE — Assessment & Plan Note (Signed)
Some pulmonary vascular congestion on CXR.  EF is 60-65% by TTE 10/21/2021.  Currently saturating well on room air. -Resume home Lasix 80 mg BID in a.m. -Strict I/O's and daily weights -Hold K supplement due to borderline high level

## 2021-12-08 NOTE — Assessment & Plan Note (Signed)
Continue amlodipine and Lasix. 

## 2021-12-08 NOTE — Assessment & Plan Note (Signed)
Continue sertraline 

## 2021-12-08 NOTE — ED Provider Notes (Signed)
[facility] DEPT Provider Note   CSN: [id number] Arrival date & time: 12/08/21  1800     History  Chief Complaint  Patient presents with   Fever    URIJAH [name] is a 61 y.o. male with history of CVA with residual right-sided weakness, pericardial effusion, history DVT and left BKA, pulmonary arterial hypertension, CKD stage III, IDA, HFpEF, ileus, T2DM, HTN, episodic mood disorder, MDD, substance use presents brought in by EMS from [facility] with concern for pneumonia.   Patient reports fevers and chills associated with headache and general malaise that began yesterday.  He states that this is how he felt the last time he had pneumonia. Per chart review, patient was recently admitted from 10/19/2021 to 11/02/2021 for acute hypoxic respiratory failure secondary to bilateral pneumonia superimposed on HFpEF exacerbation.  Patient completed azithromycin and ceftriaxone.  Also managed with aggressive diuresis with Lasix and metolazone managed by nephrology.  Currently patient denies any shortness of breath, DOE, chest pain, cough, however patient states that he did not have any other symptoms when he had pneumonia last night either.  He denies any abdominal pain lower extremity edema that is new.  Denies any dysuria/hematuria, diarrhea/constipation.  Does endorse a mild headache that is all over, denies any light sensitivity.  Endorses neck pain and muscle spasms that are common for him because he sleeps leaned over to the right side.  Patient endorses mild left-sided abdominal pain that he states is always present, nothing new or changed.  HPI     Home Medications Prior to Admission medications   Medication Sig Start Date End Date Taking? Authorizing Provider  acetaminophen (TYLENOL) 650 MG CR tablet Take 650 mg by mouth every 4 (four) hours as needed (general discomfort).    [provider]  albuterol (VENTOLIN HFA) 108 (90 Base) MCG/ACT inhaler  Inhale 2 puffs into the lungs every 6 (six) hours as needed for wheezing or shortness of breath.    [provider]  amLODipine (NORVASC) 10 MG tablet Take 0.5 tablets (5 mg total) by mouth daily. Patient taking differently: Take 10 mg by mouth 2 (two) times daily. 03/12/21   Aline August, MD  ascorbic acid (VITAMIN C) 500 MG tablet Take 500 mg by mouth 2 (two) times daily.    [provider]  aspirin 81 MG chewable tablet Chew 81 mg by mouth daily.    [provider]  atorvastatin (LIPITOR) 80 MG tablet Take 80 mg by mouth at bedtime.    [provider]  brimonidine (ALPHAGAN) 0.2 % ophthalmic solution Place 1 drop into both eyes 2 (two) times daily.    [provider]  calcium acetate (PHOSLO) 667 MG capsule Take 1 capsule (667 mg total) by mouth 3 (three) times daily with meals. 10/31/21   Erskine Emery, MD  docusate sodium (COLACE) 100 MG capsule Take 100 mg by mouth at bedtime.    [provider]  dorzolamide-timolol (COSOPT) 22.3-6.8 MG/ML ophthalmic solution Place 1 drop into both eyes 2 (two) times daily.    [provider]  ferrous sulfate 325 (65 FE) MG tablet Take 1 tablet (325 mg total) by mouth daily. 09/23/20   Raiford Noble Latif, DO  furosemide (LASIX) 80 MG tablet Take 1 tablet (80 mg total) by mouth 2 (two) times daily. 10/31/21   Erskine Emery, MD  latanoprost (XALATAN) 0.005 % ophthalmic solution Place 1 drop into both eyes at bedtime.    [provider]  Magnesium Oxide 400 MG CAPS Take 1 capsule (400 mg total) by mouth daily. 11/01/21   Erskine Emery, MD  Multiple Vitamin (MULTIVITAMIN) tablet Take 1 tablet by mouth daily.    [provider]  multivitamin (RENA-VIT) TABS tablet Take 1 tablet by mouth at bedtime. 10/31/21   Erskine Emery, MD  Nutritional Supplements (FEEDING SUPPLEMENT, NEPRO CARB STEADY,) LIQD Take 237 mLs by mouth at bedtime. 10/31/21   Erskine Emery, MD  ondansetron (ZOFRAN) 4 MG  tablet Take 4 mg by mouth every 4 (four) hours as needed for nausea or vomiting.    [provider]  pantoprazole (PROTONIX) 40 MG tablet Take 1 tablet (40 mg total) by mouth daily. 10/31/21   Erskine Emery, MD  potassium chloride SA (KLOR-CON M) 20 MEQ tablet Take 1 tablet (20 mEq total) by mouth 2 (two) times daily. 11/04/21   Erskine Emery, MD  potassium chloride SA (KLOR-CON M) 20 MEQ tablet Take 2 tablets (40 mEq total) by mouth 2 (two) times daily for 2 days. 11/01/21 11/03/21  Erskine Emery, MD  pregabalin (LYRICA) 75 MG capsule Take 1 capsule (75 mg total) by mouth daily. 10/31/21   Erskine Emery, MD  PROTEIN PO Take 30 mLs by mouth 2 (two) times daily.    [provider]  saccharomyces boulardii (FLORASTOR) 250 MG capsule Take 1 capsule (250 mg total) by mouth 2 (two) times daily. 09/07/20   Antonieta Pert, MD  sertraline (ZOLOFT) 50 MG tablet Take 50 mg by mouth every evening.    [provider]  sodium bicarbonate 650 MG tablet Take 2 tablets (1,300 mg total) by mouth 2 (two) times daily. 10/31/21   Erskine Emery, MD  tamsulosin (FLOMAX) 0.4 MG CAPS capsule Take 0.8 mg by mouth every evening.    [provider]  Vitamin D, Ergocalciferol, (DRISDOL) 1.25 MG (50000 UNIT) CAPS capsule Take 50,000 Units by mouth every 30 (thirty) days. 17th of each month    [provider]  zinc sulfate 220 (50 Zn) MG capsule Take 220 mg by mouth daily.    [provider]      Allergies    Other    Review of Systems   Review of Systems Review of systems positive for fevers chills.  A 10 point review of systems was performed and is negative unless otherwise reported in HPI.  Physical Exam Updated Vital Signs BP (!) 164/85   Pulse (!) 102   Temp 99.6 F (37.6 C) (Oral)   Resp 11   Ht '5\' 8"'$  (1.727 m)   Wt 81.6 kg   SpO2 93%   BMI 27.37 kg/m  Physical Exam General: Chronically ill-appearing male, lying in bed.  HEENT: Sclera anicteric, MMM, trachea  midline. Cardiology: RRR, no murmurs/rubs/gallops. BL radial and DP pulses equal bilaterally.  Resp: Normal respiratory rate and effort. CTAB, no wheezes, rhonchi, crackles.  Abd: Soft, non-tender, non-distended. No rebound tenderness or guarding.  GU: Deferred. MSK: Status post left BKA.  Chronic venous stasis changes to right lower extremity with hardened skin.  No peripheral signs of trauma. Extremities without erythema or TTP. No cyanosis or clubbing. Skin: warm, dry. No rashes or lesions. Neuro: A&Ox4, CNs II-XII grossly intact. MAEs. Sensation grossly intact.  Psych: Normal mood and affect.   ED Results / Procedures / Treatments   Labs (all labs ordered are listed, but only abnormal results are displayed) Labs Reviewed  CBC WITH DIFFERENTIAL/PLATELET - Abnormal; Notable for the following components:  Result Value   RBC 4.01 (*)    Hemoglobin 10.1 (*)    HCT 33.0 (*)    MCH 25.2 (*)    RDW 17.0 (*)    All other components within normal limits  CBG MONITORING, ED - Abnormal; Notable for the following components:   Glucose-Capillary 118 (*)    All other components within normal limits  SARS CORONAVIRUS 2 BY RT PCR  CULTURE, BLOOD (ROUTINE X 2)  CULTURE, BLOOD (ROUTINE X 2)  LACTIC ACID, PLASMA  COMPREHENSIVE METABOLIC PANEL  LACTIC ACID, PLASMA  PROTIME-INR  URINALYSIS, ROUTINE W REFLEX MICROSCOPIC    EKG None  Radiology DG Chest 2 View  Result Date: 12/08/2021 CLINICAL DATA:  Suspected sepsis EXAM: CHEST - 2 VIEW COMPARISON:  Chest x-ray 10/19/2021 FINDINGS: The heart is enlarged. There is central pulmonary vascular congestion. There are patchy airspace opacities in the right upper lobe. No pleural effusion or pneumothorax identified. No acute fractures are seen. IMPRESSION: 1. Cardiomegaly with central pulmonary vascular congestion. 2. Patchy airspace opacities in the right upper lobe which may represent infection or asymmetric edema. Electronically Signed   By: Ronney Asters M.D.   On: 12/08/2021 19:16    Procedures Procedures    Medications Ordered in ED Medications  ceFEPIme (MAXIPIME) 2 g in sodium chloride 0.9 % 100 mL IVPB (2 g Intravenous New Bag/Given 12/08/21 1952)  lactated ringers bolus 500 mL (500 mLs Intravenous New Bag/Given 12/08/21 1935)  acetaminophen (TYLENOL) tablet 1,000 mg (1,000 mg Oral Given 12/08/21 1955)    ED Course/ Medical Decision Making/ A&P                          Medical Decision Making Amount and/or Complexity of Data Reviewed Labs: ordered. Decision-making details documented in ED Course. Radiology: ordered. Decision-making details documented in ED Course.  Risk OTC drugs. Decision regarding hospitalization.    Patient is mildly tachycardic into the 100s and fever on arrival 100.5 F.  He is given Tylenol.  Satting 94% on room air without requiring the oxygen.  No hypotension or tachypnea.  Patient with normal work of breathing.  For patient's fever chills consider infectious process such as pneumonia given the patient states he felt the same as it last time when he was admitted.  Will evaluate chest x-ray.  Also consider other source infection such as intra-abdominal given patient's nausea vomiting but he states that he is always sore in his belly, nothing new.  Abdominal exam is benign without any areas of focal tenderness, no rebound tenderness or guarding, low concern for acute abdomen or acute peritonitis.  Consider, source infection such as UA.  No skin wounds erythema to suggest cellulitis.  Given patient's vital signs and concern for pneumonia will consider sepsis/bacteremia and obtain blood cultures.  COVID is negative.  Chest x-ray does demonstrate concern for right upper lobe pneumonia as well as pulmonary vascular congestion.  Patient be given IV cefepime given recent hospitalization and concern for Pseudomonas risk factors and will be consulted for admission to the hospital.  I have personally reviewed  and interpreted all labs and imaging.   Clinical Course as of 12/08/21 2030  Sat Dec 08, 2021  1920 DG Chest 2 View 1. Cardiomegaly with central pulmonary vascular congestion. 2. Patchy airspace opacities in the right upper lobe which may represent infection or asymmetric edema. [HN]  2028 Lactic Acid, Venous: 1.0 [HN]  2028 SARS Coronavirus 2 by RT PCR:  NEGATIVE [HN]    Clinical Course User Index [HN] Audley Hose, MD          Final Clinical Impression(s) / ED Diagnoses Final diagnoses:  Pneumonia of right upper lobe due to infectious organism    Rx / DC Orders ED Discharge Orders     None        This note was created using dictation software, which may contain spelling or grammatical errors.    Audley Hose, MD 12/08/21 2133

## 2021-12-08 NOTE — Hospital Course (Signed)
[name] is a 61 y.o. male with medical history significant for HFpEF (EF 60-65%, G2DD by TTE 10/21/2021), CKD stage IV, history of CVA with right hemiparesis, IDT2DM, HTN, HLD, PAD s/p left BKA, iron deficiency anemia, depression/anxiety who is admitted with right upper lobe pneumonia.

## 2021-12-08 NOTE — Assessment & Plan Note (Signed)
Placed on SSI. 

## 2021-12-08 NOTE — ED Triage Notes (Signed)
Pt BIBA from [place]. C/o fever and chills- states he feels similar to the last time he had pneumonia. Fever 100.5 f  Given 650 Tylenol  Aox4  BP: 180/106 HR: 108 RR: 18 CBG: 126 SPO2: 94 RA

## 2021-12-08 NOTE — Assessment & Plan Note (Signed)
S/p left BKA.  Continue aspirin and statin.

## 2021-12-08 NOTE — Assessment & Plan Note (Signed)
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  Continue aspirin, statin.

## 2021-12-08 NOTE — Assessment & Plan Note (Signed)
Renal function slightly improved from recent baseline.  Continue to monitor.

## 2021-12-08 NOTE — Assessment & Plan Note (Signed)
-   Continue Flomax 

## 2021-12-09 ENCOUNTER — Encounter (HOSPITAL_COMMUNITY): Payer: Self-pay | Admitting: Internal Medicine

## 2021-12-09 DIAGNOSIS — F319 Bipolar disorder, unspecified: Secondary | ICD-10-CM | POA: Diagnosis not present

## 2021-12-09 DIAGNOSIS — I6930 Unspecified sequelae of cerebral infarction: Secondary | ICD-10-CM | POA: Diagnosis not present

## 2021-12-09 DIAGNOSIS — Z7982 Long term (current) use of aspirin: Secondary | ICD-10-CM | POA: Diagnosis not present

## 2021-12-09 DIAGNOSIS — I739 Peripheral vascular disease, unspecified: Secondary | ICD-10-CM | POA: Diagnosis not present

## 2021-12-09 DIAGNOSIS — Z87891 Personal history of nicotine dependence: Secondary | ICD-10-CM | POA: Diagnosis not present

## 2021-12-09 DIAGNOSIS — I5032 Chronic diastolic (congestive) heart failure: Secondary | ICD-10-CM | POA: Diagnosis not present

## 2021-12-09 DIAGNOSIS — N400 Enlarged prostate without lower urinary tract symptoms: Secondary | ICD-10-CM | POA: Diagnosis not present

## 2021-12-09 DIAGNOSIS — I10 Essential (primary) hypertension: Secondary | ICD-10-CM

## 2021-12-09 DIAGNOSIS — E785 Hyperlipidemia, unspecified: Secondary | ICD-10-CM | POA: Diagnosis not present

## 2021-12-09 DIAGNOSIS — I130 Hypertensive heart and chronic kidney disease with heart failure and stage 1 through stage 4 chronic kidney disease, or unspecified chronic kidney disease: Secondary | ICD-10-CM | POA: Diagnosis not present

## 2021-12-09 DIAGNOSIS — J189 Pneumonia, unspecified organism: Secondary | ICD-10-CM | POA: Diagnosis not present

## 2021-12-09 DIAGNOSIS — Z91018 Allergy to other foods: Secondary | ICD-10-CM | POA: Diagnosis not present

## 2021-12-09 DIAGNOSIS — I2721 Secondary pulmonary arterial hypertension: Secondary | ICD-10-CM | POA: Diagnosis not present

## 2021-12-09 DIAGNOSIS — Z1152 Encounter for screening for COVID-19: Secondary | ICD-10-CM | POA: Diagnosis not present

## 2021-12-09 DIAGNOSIS — Z79899 Other long term (current) drug therapy: Secondary | ICD-10-CM | POA: Diagnosis not present

## 2021-12-09 DIAGNOSIS — Z794 Long term (current) use of insulin: Secondary | ICD-10-CM | POA: Diagnosis not present

## 2021-12-09 DIAGNOSIS — R7881 Bacteremia: Secondary | ICD-10-CM

## 2021-12-09 DIAGNOSIS — N184 Chronic kidney disease, stage 4 (severe): Secondary | ICD-10-CM

## 2021-12-09 DIAGNOSIS — I69351 Hemiplegia and hemiparesis following cerebral infarction affecting right dominant side: Secondary | ICD-10-CM | POA: Diagnosis not present

## 2021-12-09 DIAGNOSIS — E1122 Type 2 diabetes mellitus with diabetic chronic kidney disease: Secondary | ICD-10-CM | POA: Diagnosis not present

## 2021-12-09 DIAGNOSIS — B952 Enterococcus as the cause of diseases classified elsewhere: Secondary | ICD-10-CM | POA: Diagnosis not present

## 2021-12-09 DIAGNOSIS — L89890 Pressure ulcer of other site, unstageable: Secondary | ICD-10-CM | POA: Diagnosis not present

## 2021-12-09 DIAGNOSIS — Z89512 Acquired absence of left leg below knee: Secondary | ICD-10-CM | POA: Diagnosis not present

## 2021-12-09 DIAGNOSIS — Z86718 Personal history of other venous thrombosis and embolism: Secondary | ICD-10-CM | POA: Diagnosis not present

## 2021-12-09 DIAGNOSIS — E1151 Type 2 diabetes mellitus with diabetic peripheral angiopathy without gangrene: Secondary | ICD-10-CM | POA: Diagnosis not present

## 2021-12-09 DIAGNOSIS — R69 Illness, unspecified: Secondary | ICD-10-CM | POA: Diagnosis not present

## 2021-12-09 DIAGNOSIS — F418 Other specified anxiety disorders: Secondary | ICD-10-CM

## 2021-12-09 HISTORY — DX: Enterococcus as the cause of diseases classified elsewhere: B95.2

## 2021-12-09 LAB — BLOOD CULTURE ID PANEL (REFLEXED) - BCID2

## 2021-12-09 LAB — BASIC METABOLIC PANEL
Anion gap: 5 (ref 5–15)
BUN: 41 mg/dL — ABNORMAL HIGH (ref 6–20)
CO2: 20 mmol/L — ABNORMAL LOW (ref 22–32)
Calcium: 8.3 mg/dL — ABNORMAL LOW (ref 8.9–10.3)
Chloride: 115 mmol/L — ABNORMAL HIGH (ref 98–111)
Creatinine, Ser: 2.51 mg/dL — ABNORMAL HIGH (ref 0.61–1.24)
GFR, Estimated: 29 mL/min — ABNORMAL LOW (ref >60)
Glucose, Bld: 133 mg/dL — ABNORMAL HIGH (ref 70–99)
Potassium: 4.6 mmol/L (ref 3.5–5.1)
Sodium: 140 mmol/L (ref 135–145)

## 2021-12-09 LAB — CBC
HCT: 30.1 % — ABNORMAL LOW (ref 39.0–52.0)
Hemoglobin: 9.2 g/dL — ABNORMAL LOW (ref 13.0–17.0)
MCH: 25.4 pg — ABNORMAL LOW (ref 26.0–34.0)
MCHC: 30.6 g/dL (ref 30.0–36.0)
MCV: 83.1 fL (ref 80.0–100.0)
Platelets: 171 10*3/uL (ref 150–400)
RBC: 3.62 MIL/uL — ABNORMAL LOW (ref 4.22–5.81)
RDW: 17.1 % — ABNORMAL HIGH (ref 11.5–15.5)
WBC: 9.5 10*3/uL (ref 4.0–10.5)
nRBC: 0 % (ref 0.0–0.2)

## 2021-12-09 LAB — GLUCOSE, CAPILLARY
Glucose-Capillary: 107 mg/dL — ABNORMAL HIGH (ref 70–99)
Glucose-Capillary: 129 mg/dL — ABNORMAL HIGH (ref 70–99)
Glucose-Capillary: 176 mg/dL — ABNORMAL HIGH (ref 70–99)
Glucose-Capillary: 164 mg/dL — ABNORMAL HIGH (ref 70–99)

## 2021-12-09 MED ORDER — LATANOPROST 0.005 % OP SOLN
1.0000 [drp] | Freq: Every day | OPHTHALMIC | Status: DC
  Administered 2021-12-09 – 2021-12-10 (×2): 1 [drp] via OPHTHALMIC
  Filled 2021-12-09: qty 2.5

## 2021-12-09 MED ORDER — SODIUM CHLORIDE 0.9 % IV SOLN
2.0000 g | INTRAVENOUS | Status: DC
  Filled 2021-12-09 (×2): qty 2000

## 2021-12-09 MED ORDER — BRIMONIDINE TARTRATE 0.2 % OP SOLN
1.0000 [drp] | Freq: Two times a day (BID) | OPHTHALMIC | Status: DC
  Administered 2021-12-09 – 2021-12-11 (×5): 1 [drp] via OPHTHALMIC
  Filled 2021-12-09: qty 5

## 2021-12-09 MED ORDER — DORZOLAMIDE HCL-TIMOLOL MAL 2-0.5 % OP SOLN
1.0000 [drp] | Freq: Two times a day (BID) | OPHTHALMIC | Status: DC
  Administered 2021-12-09 – 2021-12-11 (×5): 1 [drp] via OPHTHALMIC
  Filled 2021-12-09: qty 10

## 2021-12-09 MED ORDER — SODIUM CHLORIDE 0.9 % IV SOLN
2.0000 g | Freq: Four times a day (QID) | INTRAVENOUS | Status: DC
  Administered 2021-12-09 – 2021-12-10 (×4): 2 g via INTRAVENOUS
  Filled 2021-12-09 (×4): qty 2000

## 2021-12-09 NOTE — Consult Note (Signed)
[facility]                                                                                       Patient Identification: Patient Name: [name] MRN: [id number] [place] Date: 12/08/2021  6:07 PM Today's Date: 12/09/2021 Reason for consult: bacteremia  Requesting provider: CHAMP auto consult   Principal Problem:   Right upper lobe pneumonia Active Problems:   Hypertension   Type 2 diabetes mellitus ([place])   CKD (chronic kidney disease) stage 4, GFR 15-29 ml/min (HCC)   History of cerebrovascular accident (CVA) with residual deficit   Depression with anxiety   BPH (benign prostatic hyperplasia)   PAD (peripheral artery disease) ([place])   Chronic heart failure with preserved ejection fraction (HFpEF) (HCC)   Antibiotics:  Azithromycin 10/14 Cefepime 10/14 Ceftriaxone 10/15-c  Lines/Hardware:  Assessment 61 year old male with a PMH of CVA with residual right-sided weakness, DVT and left BKA, pulmonary arterial hypertension, CKD, IDA, CHF, arthritis, bipolar disorder/depression, DM, hypertension,substance abuse who presented to the ED from [place] with concern for fevers and chills generalized weakness for 1 day. ID engaged for E faecalis bacteremia.   Unclear source of bacteremia. No frank GI or GU symptoms except some mild nausea and vomiting. Doubt this is PNA although chest Xray with some concerns as he has zero respiratory symptoms. The wound in the left BKA site does not appear to be infected.   Recommendations  Antibiotics already appropriately de-escalated to IV ampicillin only, will continue  I will order 2 sets of peripheral blood cx for 10/16 TTE  WOC for wound at the Left BKA ordered Fu pending microbiologic data  Dr Linus Salmons to follow from tomorrow.   Rest of the management as per the primary team. Please call with questions or concerns.  Thank you for the  consult  Rosiland Oz, MD Infectious Disease Physician [facility] for Infectious Disease [address] [place], [place] [address] Phone: [phone]  Fax: [phone]  __________________________________________________________________________________________________________ HPI and Hospital Course: 61 year old male with a PMH of CVA with residual right-sided weakness, DVT and left BKA, pulmonary arterial hypertension, CKD, IDA, CHF, arthritis, bipolar disorder/depression, DM, hypertension,substance abuse who presented to the ED from [facility] with concern for fevers and chills generalized weakness for 1 day. Patient is a poor historian. Had an episode of nausea with nonbilious nonbloody vomiting but unclear if he aspirated.  Denies any chest pain, dyspnea, cough or abdominal pain or diarrhea.  Of note, patient was recently admitted August 25 to September 28 for acute hypoxic respiratory failure secondary to bilateral pneumonia superimposed on heart failure exacerbation and received a course of azithromycin and ceftriaxone + managed with diuretics.  At ED, Afebrile, mildly tachycardic, satting 94% on room air without requiring oxygen Chest x-ray with right upper lobe pneumonia as well as pulmonary vascular congestion Patient was was started on IV cefepime for concerns of HAP Blood cx 10/14 1st sets with GPC and 2nd set pending    ROS: as above although limited and patient does not answer most of the questions.   Past Medical History:  Diagnosis Date   Arthritis    Bipolar affective ([place])    CHF (congestive heart failure) ([place])    Claustrophobia    Severe   Depression    Diabetes mellitus    uncontrolled    Heart murmur    Hypertension    Past Surgical History:  Procedure Laterality Date   ABDOMINAL AORTOGRAM W/LOWER EXTREMITY Left 03/02/2021   Procedure: ABDOMINAL AORTOGRAM W/LOWER EXTREMITY;  Surgeon: Cherre Robins, MD;  Location: [facility];  Service: Cardiovascular;  Laterality: Left;   AMPUTATION Left 03/07/2021   Procedure: LEFT BELOW KNEE AMPUTATION;  Surgeon: Cherre Robins, MD;  Location: [facility];  Service: Vascular;  Laterality: Left;   APPLICATION OF WOUND VAC Left 03/07/2021   Procedure: APPLICATION OF WOUND VAC;  Surgeon: Cherre Robins, MD;  Location: [facility];  Service: Vascular;  Laterality: Left;   BOWEL DECOMPRESSION N/A 01/19/2020   Procedure: BOWEL DECOMPRESSION;  Surgeon: Otis Brace, MD;  Location: [facility];  Service: Gastroenterology;  Laterality: N/A;   BOWEL DECOMPRESSION N/A 11/19/2020   Procedure: BOWEL DECOMPRESSION;  Surgeon: Wilford Corner, MD;  Location: [facility];  Service: Endoscopy;  Laterality: N/A;   BOWEL DECOMPRESSION N/A 12/03/2020   Procedure: BOWEL DECOMPRESSION;  Surgeon: Ronnette Juniper, MD;  Location: [facility];  Service: Gastroenterology;  Laterality: N/A;   COLONOSCOPY WITH PROPOFOL N/A 01/19/2020   Procedure: COLONOSCOPY WITH PROPOFOL;  Surgeon: Otis Brace, MD;  Location: [facility];  Service: Gastroenterology;  Laterality: N/A;   COLONOSCOPY WITH PROPOFOL N/A 11/19/2020   Procedure: COLONOSCOPY WITH PROPOFOL;  Surgeon: Wilford Corner, MD;  Location: [facility];  Service: Endoscopy;  Laterality: N/A;   COLONOSCOPY WITH PROPOFOL N/A 12/03/2020   Procedure: COLONOSCOPY WITH PROPOFOL;  Surgeon: Ronnette Juniper, MD;  Location: [facility];  Service: Gastroenterology;  Laterality: N/A;   KNEE SURGERY     b/l knees; pending total knee    RADIOLOGY WITH ANESTHESIA N/A 07/04/2020   Procedure: MRI WITH ANESTHESIA BRAIN WITHOUT CONTRAST AND HEAD WITHOUT CONTRAST;  Surgeon: Radiologist, Medication, MD;  Location: [place];  Service: Radiology;  Laterality: N/A;   TRANSMETATARSAL AMPUTATION Left 03/01/2021   Procedure: TRANSMETATARSAL AMPUTATION;  Surgeon: Trula Slade, DPM;  Location: [facility];  Service: Podiatry;  Laterality: Left;     Scheduled Meds:   amLODipine  5 mg Oral Daily   aspirin  81 mg Oral Daily   atorvastatin  80 mg Oral QHS   brimonidine  1 drop Both Eyes BID   docusate sodium  100 mg Oral QHS   dorzolamide-timolol  1 drop Both Eyes BID   furosemide  80 mg Oral BID   heparin  5,000 Units Subcutaneous Q8H   insulin aspart  0-9 Units Subcutaneous TID WC   latanoprost  1 drop Both Eyes QHS   pantoprazole  40 mg Oral Daily   sertraline  50 mg Oral QPM   sodium bicarbonate  1,300 mg Oral BID   sodium chloride flush  3 mL Intravenous Q12H   tamsulosin  0.8 mg Oral QPM   Continuous Infusions:  azithromycin 500 mg (12/09/21 0005)   cefTRIAXone (ROCEPHIN)  IV 2 g (12/09/21 1120)   PRN Meds:.acetaminophen **OR** acetaminophen, hydrALAZINE, ondansetron **OR** ondansetron (ZOFRAN) IV, senna-docusate  Allergies  Allergen Reactions   Other Anaphylaxis, Itching, Swelling and Other (See Comments)    Grits - swelling   Social History   Socioeconomic History   Marital status: Single    Spouse name: Not on file  Number of children: Not on file   Years of education: Not on file   Highest education level: Not on file  Occupational History   Not on file  Tobacco Use   Smoking status: Former   Smokeless tobacco: Never  Vaping Use   Vaping Use: Never used  Substance and Sexual Activity   Alcohol use: No   Drug use: Not Currently    Types: Cocaine    Comment: Last use 2019   Sexual activity: Yes    Birth control/protection: Condom  Other Topics Concern   Not on file  Social History Narrative   Moved from [place]   Denies smoking, drinking   +cocaine history (denies recent use)   Originally from [place]    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[employer]   No kids, married                      Social Determinants of Radio broadcast assistant Strain: Not on file  Food Insecurity: No [place] (12/09/2021)   Hunger Vital Sign    Worried About Running Out of Food  in the Last Year: Never true    [place] in the Last Year: Never true  Transportation Needs: No Transportation Needs (12/09/2021)   PRAPARE - Hydrologist (Medical): No    Lack of Transportation (Non-Medical): No  Physical Activity: Not on file  Stress: Not on file  Social Connections: Not on file  Intimate Partner Violence: Not At Risk (12/09/2021)   Humiliation, Afraid, Rape, and Kick questionnaire    Fear of Current or Ex-Partner: No    Emotionally Abused: No    Physically Abused: No    Sexually Abused: No   Family History  Problem Relation Age of Onset   Diabetes Other    Cancer Mother        uterus; deceased    Heart attack Mother 63   Hypertension Other    Heart attack Father 2   Stroke Sister        12/2012 age 45 y.o    Vitals BP (!) 176/105 (BP Location: Left Wrist)   Pulse 82   Temp 98.1 F (36.7 C) (Oral)   Resp 17   Ht '5\' 8"'$  (1.727 m)   Wt 96.2 kg   SpO2 100%   BMI 32.25 kg/m   Physical Exam Constitutional:  sitting up in bed and appears very comfortable in room air without any respiratory distress    Comments:   Cardiovascular:     Rate and Rhythm: Normal rate and regular rhythm.     Heart sounds:  Pulmonary:     Effort: Pulmonary effort is normal on room air    Comments: Normal breath sounds with some rales.   Abdominal:     Palpations: Abdomen is soft.     Tenderness: non distended and non tender   Musculoskeletal:        General: No swelling or tenderness. Left BKA with small wound at the stump with no signs of acute infection/cellulitis or active drainage.   Skin:    Comments: as above  Neurological:     General: awake, alert and oriented, Chronic rt sided weaknes  Psychiatric:        Mood and Affect: somewhat irritable   Pertinent Microbiology Results for orders placed or performed during the hospital encounter of 12/08/21  SARS Coronavirus 2  by RT PCR (hospital order, performed in [facility]  hospital lab) *cepheid single result test* Anterior Nasal Swab     Status: None   Collection Time: 12/08/21  7:12 PM   Specimen: Anterior Nasal Swab  Result Value Ref Range Status   SARS Coronavirus 2 by RT PCR NEGATIVE NEGATIVE Final    Comment: (NOTE) SARS-CoV-2 target nucleic acids are NOT DETECTED.  The SARS-CoV-2 RNA is generally detectable in upper and lower respiratory specimens during the acute phase of infection. The lowest concentration of SARS-CoV-2 viral copies this assay can detect is 250 copies / mL. A negative result does not preclude SARS-CoV-2 infection and should not be used as the sole basis for treatment or other patient management decisions.  A negative result may occur with improper specimen collection / handling, submission of specimen other than nasopharyngeal swab, presence of viral mutation(s) within the areas targeted by this assay, and inadequate number of viral copies (<250 copies / mL). A negative result must be combined with clinical observations, patient history, and epidemiological information.  Fact Sheet for Patients:   https://www.patel.info/  Fact Sheet for Healthcare Providers: https://hall.com/  This test is not yet approved or  cleared by the [place] FDA and has been authorized for detection and/or diagnosis of SARS-CoV-2 by FDA under an Emergency Use Authorization (EUA).  This EUA will remain in effect (meaning this test can be used) for the duration of the COVID-19 declaration under Section 564(b)(1) of the Act, 21 U.S.C. section 360bbb-3(b)(1), unless the authorization is terminated or revoked sooner.  Performed at [facility], [place] [address] [place], [place] [address]   Culture, blood (Routine x 2)     Status: None (Preliminary result)   Collection Time: 12/08/21  7:19 PM   Specimen: BLOOD LEFT FOREARM  Result Value Ref Range Status   Specimen Description   Final     BLOOD LEFT FOREARM Performed at [place] [address], [place], [place] [address]    Special Requests   Final    BOTTLES DRAWN AEROBIC AND ANAEROBIC Blood Culture results may not be optimal due to an excessive volume of blood received in culture bottles Performed at [place] [address], [place], [place] [address]    Culture  Setup Time   Final    GRAM POSITIVE COCCI IN CHAINS ANAEROBIC BOTTLE ONLY CRITICAL RESULT CALLED TO, READ BACK BY AND VERIFIED WITH: [place].  [phone] FCP Performed at [facility], [address], [place], [place] [address]    Culture GRAM POSITIVE COCCI  Final   Report Status PENDING  Incomplete  Blood Culture ID Panel (Reflexed)     Status: Abnormal   Collection Time: 12/08/21  7:19 PM  Result Value Ref Range Status   Enterococcus faecalis DETECTED (A) NOT DETECTED Final    Comment: CRITICAL RESULT CALLED TO, READ BACK BY AND VERIFIED WITH: PHARMD DREW W.  [phone] FCP    Enterococcus Faecium NOT DETECTED NOT DETECTED Final   Listeria monocytogenes NOT DETECTED NOT DETECTED Final   Staphylococcus species NOT DETECTED NOT DETECTED Final   Staphylococcus aureus (BCID) NOT DETECTED NOT DETECTED Final   Staphylococcus epidermidis NOT DETECTED NOT DETECTED Final   Staphylococcus lugdunensis NOT DETECTED NOT DETECTED Final   Streptococcus species NOT DETECTED NOT DETECTED Final   Streptococcus agalactiae NOT DETECTED NOT DETECTED Final   Streptococcus pneumoniae NOT DETECTED NOT DETECTED Final   Streptococcus pyogenes  NOT DETECTED NOT DETECTED Final   A.calcoaceticus-baumannii NOT DETECTED NOT DETECTED Final   Bacteroides fragilis NOT DETECTED NOT DETECTED Final   Enterobacterales NOT DETECTED NOT DETECTED Final   Enterobacter cloacae complex NOT DETECTED NOT DETECTED Final   Escherichia coli NOT DETECTED NOT DETECTED Final   Klebsiella aerogenes NOT DETECTED NOT DETECTED Final    Klebsiella oxytoca NOT DETECTED NOT DETECTED Final   Klebsiella pneumoniae NOT DETECTED NOT DETECTED Final   Proteus species NOT DETECTED NOT DETECTED Final   Salmonella species NOT DETECTED NOT DETECTED Final   Serratia marcescens NOT DETECTED NOT DETECTED Final   Haemophilus influenzae NOT DETECTED NOT DETECTED Final   Neisseria meningitidis NOT DETECTED NOT DETECTED Final   Pseudomonas aeruginosa NOT DETECTED NOT DETECTED Final   Stenotrophomonas maltophilia NOT DETECTED NOT DETECTED Final   Candida albicans NOT DETECTED NOT DETECTED Final   Candida auris NOT DETECTED NOT DETECTED Final   Candida glabrata NOT DETECTED NOT DETECTED Final   Candida krusei NOT DETECTED NOT DETECTED Final   Candida parapsilosis NOT DETECTED NOT DETECTED Final   Candida tropicalis NOT DETECTED NOT DETECTED Final   Cryptococcus neoformans/gattii NOT DETECTED NOT DETECTED Final   Vancomycin resistance NOT DETECTED NOT DETECTED Final    Comment: Performed at [facility], [address], [place], [place] [address]  Culture, blood (Routine x 2)     Status: None (Preliminary result)   Collection Time: 12/08/21  7:40 PM   Specimen: Left Antecubital; Blood  Result Value Ref Range Status   Specimen Description   Final    LEFT ANTECUBITAL BLOOD Performed at [facility], [place] [address], [place], [place] [address]    Special Requests   Final    BOTTLES DRAWN AEROBIC AND ANAEROBIC Blood Culture results may not be optimal due to an excessive volume of blood received in culture bottles Performed at [place] [address], [place], [place] [address]    Culture PENDING  Incomplete   Report Status PENDING  Incomplete    Pertinent Lab seen by me:    Latest Ref Rng & Units 12/09/2021    5:26 AM 12/08/2021    7:19 PM 10/31/2021   12:49 PM  CBC  WBC 4.0 - 10.5 K/uL 9.5  9.8  5.3   Hemoglobin 13.0 - 17.0 g/dL 9.2  10.1  7.8   Hematocrit 39.0 - 52.0 % 30.1  33.0  25.5    Platelets 150 - 400 K/uL 171  188  246       Latest Ref Rng & Units 12/09/2021    5:26 AM 12/08/2021    7:19 PM 11/01/2021    9:52 AM  CMP  Glucose 70 - 99 mg/dL 133  126  113   BUN 6 - 20 mg/dL 41  39  55   Creatinine 0.61 - 1.24 mg/dL 2.51  2.53  2.89   Sodium 135 - 145 mmol/L 140  139  139   Potassium 3.5 - 5.1 mmol/L 4.6  5.1  3.1   Chloride 98 - 111 mmol/L 115  115  106   CO2 22 - 32 mmol/L '20  20  23   '$ Calcium 8.9 - 10.3 mg/dL 8.3  8.4  8.4   Total Protein 6.5 - 8.1 g/dL  7.1    Total Bilirubin 0.3 - 1.2 mg/dL  0.6    Alkaline Phos 38 - 126 U/L  124    AST 15 - 41 U/L  23    ALT 0 - 44 U/L  23      Pertinent Imagings/Other Imagings Plain films and CT images have been personally visualized and interpreted; radiology reports have been reviewed. Decision making incorporated into the Impression / Recommendations.  DG Chest 2 View  Result Date: 12/08/2021 CLINICAL DATA:  Suspected sepsis EXAM: CHEST - 2 VIEW COMPARISON:  Chest x-ray 10/19/2021 FINDINGS: The heart is enlarged. There is central pulmonary vascular congestion. There are patchy airspace opacities in the right upper lobe. No pleural effusion or pneumothorax identified. No acute fractures are seen. IMPRESSION: 1. Cardiomegaly with central pulmonary vascular congestion. 2. Patchy airspace opacities in the right upper lobe which may represent infection or asymmetric edema. Electronically Signed   By: Ronney Asters M.D.   On: 12/08/2021 19:16    I spent 110 minutes for this patient encounter including review of prior medical records/discussing diagnostics and treatment plan with the patient/family/coordinate care with primary/other specialits with greater than 50% of time in face to face encounter.   Electronically signed by:   Rosiland Oz, MD Infectious Disease Physician [facility] for Infectious Disease Pager: [phone]

## 2021-12-09 NOTE — Progress Notes (Addendum)
Report was received from unit charge nurse. Patient received to the unit by stretcher. Patient oriented x4. Patient oriented to unit. A sandwich and drink was also provided to the patient. Call light and side table within reach of patient. Bed rails x 3 up and bed in the lowest position. No further questions from patient at this time. Will continue to monitor patient.

## 2021-12-09 NOTE — Progress Notes (Signed)
PHARMACY - PHYSICIAN COMMUNICATION CRITICAL VALUE ALERT - BLOOD CULTURE IDENTIFICATION (BCID)  [name] is an 61 y.o. male who presented to [facility] on 12/08/2021 with a chief complaint of fever, chills, diaphoresis, n/v. No respiratory symptoms, but initially treated for CAP with patchy RUL opacities noted on CXR.  Assessment:  1/4 BCx bottles growing enterococcus faecalis, no resistance detected.  Name of physician (or Provider) Contacted: [place]  Current antibiotics: Rocephin, Azithromycin  Changes to prescribed antibiotics recommended: Narrow abx to Ampicillin (2g IV q6 hr) alone. Without respiratory symptoms, likely can hold off on CAP treatment for now. Recommendations accepted by provider  Results for orders placed or performed during the hospital encounter of 12/08/21  Blood Culture ID Panel (Reflexed) (Collected: 12/08/2021  7:19 PM)  Result Value Ref Range   Enterococcus faecalis DETECTED (A) NOT DETECTED   Enterococcus Faecium NOT DETECTED NOT DETECTED   Listeria monocytogenes NOT DETECTED NOT DETECTED   Staphylococcus species NOT DETECTED NOT DETECTED   Staphylococcus aureus (BCID) NOT DETECTED NOT DETECTED   Staphylococcus epidermidis NOT DETECTED NOT DETECTED   Staphylococcus lugdunensis NOT DETECTED NOT DETECTED   Streptococcus species NOT DETECTED NOT DETECTED   Streptococcus agalactiae NOT DETECTED NOT DETECTED   Streptococcus pneumoniae NOT DETECTED NOT DETECTED   Streptococcus pyogenes NOT DETECTED NOT DETECTED   A.calcoaceticus-baumannii NOT DETECTED NOT DETECTED   Bacteroides fragilis NOT DETECTED NOT DETECTED   Enterobacterales NOT DETECTED NOT DETECTED   Enterobacter cloacae complex NOT DETECTED NOT DETECTED   Escherichia coli NOT DETECTED NOT DETECTED   Klebsiella aerogenes NOT DETECTED NOT DETECTED   Klebsiella oxytoca NOT DETECTED NOT DETECTED   Klebsiella pneumoniae NOT DETECTED NOT DETECTED   Proteus species NOT DETECTED NOT DETECTED    Salmonella species NOT DETECTED NOT DETECTED   Serratia marcescens NOT DETECTED NOT DETECTED   Haemophilus influenzae NOT DETECTED NOT DETECTED   Neisseria meningitidis NOT DETECTED NOT DETECTED   Pseudomonas aeruginosa NOT DETECTED NOT DETECTED   Stenotrophomonas maltophilia NOT DETECTED NOT DETECTED   Candida albicans NOT DETECTED NOT DETECTED   Candida auris NOT DETECTED NOT DETECTED   Candida glabrata NOT DETECTED NOT DETECTED   Candida krusei NOT DETECTED NOT DETECTED   Candida parapsilosis NOT DETECTED NOT DETECTED   Candida tropicalis NOT DETECTED NOT DETECTED   Cryptococcus neoformans/gattii NOT DETECTED NOT DETECTED   Vancomycin resistance NOT DETECTED NOT DETECTED    [name] A 12/09/2021  3:43 PM

## 2021-12-09 NOTE — Progress Notes (Signed)
PROGRESS NOTE    [name]  WLN:[id number] DOB: 04-11-1960 DOA: 12/08/2021 PCP: Jodi Marble, MD    Brief Narrative:   [name] is a 61 y.o. male with past medical history significant for HFpEF (EF 60-65%, G2DD by TTE 10/21/2021), CKD stage IV, history of CVA with right hemiparesis, IDT2DM, HTN, HLD, PAD s/p left BKA, iron deficiency anemia, depression/anxiety who presented to [name] on 10[facility]14 from [facility] for evaluation of fever and chills. Patient reports new onset of chills and diaphoresis beginning yesterday.  He had an episode of nausea with vomiting but is not sure if he aspirated.  He is not sure if he has had any fevers.  He denies any chest pain, dyspnea, cough, abdominal pain.  He reports good urine output.  He reports chronic right-sided weakness due to prior stroke which is unchanged.   In the ED, BP 179/94, pulse 105, RR 22, temp 99.6 F, SPO2 98% on room air. WBC 9.8, hemoglobin 10.1, platelets 188,000, sodium 139, potassium 5.1, bicarb 20, BUN 39, creatinine 2.53, serum glucose 126, LFTs within normal limits, lactic acid 1.0. SARS-CoV-2 PCR negative.  Blood cultures and Urinalysis pending collection. 2 view chest x-ray showed cardiomegaly with central pulmonary vascular congestion and patchy airspace opacities in the right upper lobe. Patient was given 500 cc LR and IV cefepime.  The hospitalist service was consulted to admit for further evaluation and management.  Assessment & Plan:   Right upper lobe pneumonia Patient diaphoretic with chills.  CXR suggestive of RUL pneumonia.  COVID is negative.  Slightly elevated pulse and respiratory rate on arrival but not with subsequent readings.  Sepsis not ruled in on admission. --Blood cultures x2: Pending --Strep pneumo urinary antigen: Pending --Azithromycin 500 mg IV q24h, plan 5-day course --Ceftriaxone 2 g IV q24h, plan 5 day course --Oxygenating well on room air --If remains afebrile next 24 hours with no  oxygen requirements anticipate discharge back to SNF with oral antibiotics to complete course for pneumonia   Chronic heart failure with preserved ejection fraction (HFpEF) ([place]) Some pulmonary vascular congestion on CXR.  EF is 60-65% by TTE 10/21/2021.  Currently saturating well on room air. --Resume home Lasix 80 mg BID in a.m. --Strict I/O's and daily weights --Hold K supplement due to borderline high level --Repeat BMP in a.m.   CKD (chronic kidney disease) stage 4, GFR 15-29 ml/min (HCC) Renal function slightly improved from recent baseline.   --Cr 2.53>2.51 --Sodium bicarbonate 1300 mg p.o. twice daily --BMP daily   Hypertension Continue amlodipine 5 mg p.o. daily and Lasix 80 mg p.o. twice daily.   Type 2 diabetes mellitus (HCC) On Lantus 15 units immensely daily at baseline.  Hemoglobin A1c 5.8 in 10/21/2021, well controlled. --Hold long-acting insulin for now --Sensitive SSI for coverage --CBGs qAC/HS.   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  Continue aspirin, statin.   PAD (peripheral artery disease) (HCC) S/p left BKA.  Continue aspirin and statin.   BPH (benign prostatic hyperplasia) Continue Flomax.   Depression with anxiety Continue sertraline.   DVT prophylaxis: heparin injection 5,000 Units Start: 12/09/21 0600    Code Status: Full Code Family Communication: No family present at bedside this morning  Disposition Plan:  Level of care: Telemetry Status is: Observation The patient remains OBS appropriate and will d/c before 2 midnights.    Consultants:  None  Procedures:  None  Antimicrobials:  Cefepime 10/14 -  10/14 Azithromycin 10/14>> Ceftriaxone 10/15>>   Subjective: Patient seen examined bedside, resting comfortably.  Lying in bed sleeping but easy arousable.  States "I am tired and would like to sleep".  No specific complaints at this time.  Currently afebrile and no  oxygen need.  Continues on IV antibiotics.  No other questions or concerns at this time.  Denies headache, no dizziness, no chest pain, no palpitations, no shortness of breath, no fever/chills/night sweats, no nausea/vomiting/diarrhea, no abdominal pain.  No acute events overnight per nursing staff.  Objective: Vitals:   12/08/21 2321 12/09/21 0051 12/09/21 0500 12/09/21 0506  BP: (!) 184/102 (!) 150/91  (!) 156/91  Pulse: 82 81  80  Resp:    14  Temp: 98.5 F (36.9 C)   98.4 F (36.9 C)  TempSrc: Oral   Oral  SpO2: 100% 100%  100%  Weight:   96.2 kg   Height:        Intake/Output Summary (Last 24 hours) at 12/09/2021 1119 Last data filed at 12/09/2021 0600 Gross per 24 hour  Intake 1830 ml  Output 850 ml  Net 980 ml   Filed Weights   12/08/21 1840 12/09/21 0500  Weight: 81.6 kg 96.2 kg    Examination:  Physical Exam: GEN: NAD, alert and oriented x 3, chronically ill in appearance, appears older than stated age HEENT: NCAT, PERRL, EOMI, sclera clear, MMM, poor dentition PULM: CTAB w/o wheezes/crackles, normal respiratory effort CV: RRR w/o M/G/R GI: abd soft, NTND, NABS, no R/G/M MSK: 1+ right lower extremity peripheral edema, noted left BKA, diminished muscle strength right upper/lower extremity due to prior CVA NEURO: CN II-XII intact, no focal deficits, sensation to light touch intact PSYCH: Depressed mood, flat affect Integumentary: dry/intact, no rashes or wounds    Data Reviewed: I have personally reviewed following labs and imaging studies  CBC: Recent Labs  Lab 12/08/21 1919 12/09/21 0526  WBC 9.8 9.5  NEUTROABS 8.7*  --   HGB 10.1* 9.2*  HCT 33.0* 30.1*  MCV 82.3 83.1  PLT 188 701   Basic Metabolic Panel: Recent Labs  Lab 12/08/21 1919 12/09/21 0526  NA 139 140  K 5.1 4.6  CL 115* 115*  CO2 20* 20*  GLUCOSE 126* 133*  BUN 39* 41*  CREATININE 2.53* 2.51*  CALCIUM 8.4* 8.3*   GFR: Estimated Creatinine Clearance: 35.2 mL/min (A) (by C-G  formula based on SCr of 2.51 mg/dL (H)). Liver Function Tests: Recent Labs  Lab 12/08/21 1919  AST 23  ALT 23  ALKPHOS 124  BILITOT 0.6  PROT 7.1  ALBUMIN 2.6*   No results for input(s): "LIPASE", "AMYLASE" in the last 168 hours. No results for input(s): "AMMONIA" in the last 168 hours. Coagulation Profile: Recent Labs  Lab 12/08/21 1919  INR 1.3*   Cardiac Enzymes: No results for input(s): "CKTOTAL", "CKMB", "CKMBINDEX", "TROPONINI" in the last 168 hours. BNP (last 3 results) No results for input(s): "PROBNP" in the last 8760 hours. HbA1C: No results for input(s): "HGBA1C" in the last 72 hours. CBG: Recent Labs  Lab 12/08/21 1846 12/09/21 0756  GLUCAP 118* 107*   Lipid Profile: No results for input(s): "CHOL", "HDL", "LDLCALC", "TRIG", "CHOLHDL", "LDLDIRECT" in the last 72 hours. Thyroid Function Tests: No results for input(s): "TSH", "T4TOTAL", "FREET4", "T3FREE", "THYROIDAB" in the last 72 hours. Anemia Panel: No results for input(s): "VITAMINB12", "FOLATE", "FERRITIN", "TIBC", "IRON", "RETICCTPCT" in the last 72 hours. Sepsis Labs: Recent Labs  Lab 12/08/21 1919 12/08/21 2130  LATICACIDVEN  1.0 0.8    Recent Results (from the past 240 hour(s))  SARS Coronavirus 2 by RT PCR ([name] order, performed in [name] lab) *cepheid single result test* Anterior Nasal Swab     Status: None   Collection Time: 12/08/21  7:12 PM   Specimen: Anterior Nasal Swab  Result Value Ref Range Status   SARS Coronavirus 2 by RT PCR NEGATIVE NEGATIVE Final    Comment: (NOTE) SARS-CoV-2 target nucleic acids are NOT DETECTED.  The SARS-CoV-2 RNA is generally detectable in upper and lower respiratory specimens during the acute phase of infection. The lowest concentration of SARS-CoV-2 viral copies this assay can detect is 250 copies / mL. A negative result does not preclude SARS-CoV-2 infection and should not be used as the sole basis for treatment or other patient  management decisions.  A negative result may occur with improper specimen collection / handling, submission of specimen other than nasopharyngeal swab, presence of viral mutation(s) within the areas targeted by this assay, and inadequate number of viral copies (<250 copies / mL). A negative result must be combined with clinical observations, patient history, and epidemiological information.  Fact Sheet for Patients:   https://www.patel.info/  Fact Sheet for Healthcare Providers: https://hall.com/  This test is not yet approved or  cleared by the [place] FDA and has been authorized for detection and/or diagnosis of SARS-CoV-2 by FDA under an Emergency Use Authorization (EUA).  This EUA will remain in effect (meaning this test can be used) for the duration of the COVID-19 declaration under Section 564(b)(1) of the Act, 21 U.S.C. section 360bbb-3(b)(1), unless the authorization is terminated or revoked sooner.  Performed at [facility], [place] [address], [place], [place] [address]   Culture, blood (Routine x 2)     Status: None (Preliminary result)   Collection Time: 12/08/21  7:40 PM   Specimen: Left Antecubital; Blood  Result Value Ref Range Status   Specimen Description   Final    LEFT ANTECUBITAL BLOOD Performed at [facility] [address], [place], [place] [address]    Special Requests   Final    BOTTLES DRAWN AEROBIC AND ANAEROBIC Blood Culture results may not be optimal due to an excessive volume of blood received in culture bottles Performed at [facility] [address], [place], [place] [address]    Culture PENDING  Incomplete   Report Status PENDING  Incomplete         Radiology Studies: DG Chest 2 View  Result Date: 12/08/2021 CLINICAL DATA:  Suspected sepsis EXAM: CHEST - 2 VIEW COMPARISON:  Chest x-ray 10/19/2021 FINDINGS: The heart is enlarged. There is central  pulmonary vascular congestion. There are patchy airspace opacities in the right upper lobe. No pleural effusion or pneumothorax identified. No acute fractures are seen. IMPRESSION: 1. Cardiomegaly with central pulmonary vascular congestion. 2. Patchy airspace opacities in the right upper lobe which may represent infection or asymmetric edema. Electronically Signed   By: Ronney Asters M.D.   On: 12/08/2021 19:16        Scheduled Meds:  amLODipine  5 mg Oral Daily   aspirin  81 mg Oral Daily   atorvastatin  80 mg Oral QHS   docusate sodium  100 mg Oral QHS   furosemide  80 mg Oral BID   heparin  5,000 Units Subcutaneous Q8H   insulin aspart  0-9 Units Subcutaneous TID WC   pantoprazole  40 mg Oral Daily  sertraline  50 mg Oral QPM   sodium bicarbonate  1,300 mg Oral BID   sodium chloride flush  3 mL Intravenous Q12H   tamsulosin  0.8 mg Oral QPM   Continuous Infusions:  azithromycin 500 mg (12/09/21 0005)   cefTRIAXone (ROCEPHIN)  IV       LOS: 0 days    Time spent: 51 minutes spent on chart review, discussion with nursing staff, consultants, updating family and interview/physical exam; more than 50% of that time was spent in counseling and/or coordination of care.    Dorothey Oetken J [place] DO [facility] Available via Epic secure chat 7am-7pm After these hours, please refer to coverage provider listed on [email or web address] 12/09/2021, 11:19 AM

## 2021-12-10 ENCOUNTER — Inpatient Hospital Stay (HOSPITAL_COMMUNITY): Payer: Medicare HMO

## 2021-12-10 DIAGNOSIS — R7881 Bacteremia: Secondary | ICD-10-CM

## 2021-12-10 DIAGNOSIS — B952 Enterococcus as the cause of diseases classified elsewhere: Secondary | ICD-10-CM | POA: Diagnosis not present

## 2021-12-10 DIAGNOSIS — J189 Pneumonia, unspecified organism: Secondary | ICD-10-CM | POA: Diagnosis not present

## 2021-12-10 DIAGNOSIS — N400 Enlarged prostate without lower urinary tract symptoms: Secondary | ICD-10-CM | POA: Diagnosis not present

## 2021-12-10 DIAGNOSIS — I5032 Chronic diastolic (congestive) heart failure: Secondary | ICD-10-CM | POA: Diagnosis not present

## 2021-12-10 LAB — ECHOCARDIOGRAM COMPLETE
AR max vel: 0.93 cm2
AV Area VTI: 0.87 cm2
AV Area mean vel: 0.83 cm2
AV Mean grad: 17 mmHg
AV Peak grad: 22.7 mmHg
Ao pk vel: 2.38 m/s
Area-P 1/2: 2.99 cm2
Height: 68 in
S' Lateral: 3 cm
Weight: 3763.69 oz

## 2021-12-10 LAB — BASIC METABOLIC PANEL
Anion gap: 7 (ref 5–15)
BUN: 44 mg/dL — ABNORMAL HIGH (ref 6–20)
CO2: 19 mmol/L — ABNORMAL LOW (ref 22–32)
Calcium: 8 mg/dL — ABNORMAL LOW (ref 8.9–10.3)
Chloride: 111 mmol/L (ref 98–111)
Creatinine, Ser: 2.61 mg/dL — ABNORMAL HIGH (ref 0.61–1.24)
GFR, Estimated: 27 mL/min — ABNORMAL LOW (ref >60)
Glucose, Bld: 136 mg/dL — ABNORMAL HIGH (ref 70–99)
Potassium: 3.7 mmol/L (ref 3.5–5.1)
Sodium: 137 mmol/L (ref 135–145)

## 2021-12-10 LAB — GLUCOSE, CAPILLARY
Glucose-Capillary: 141 mg/dL — ABNORMAL HIGH (ref 70–99)
Glucose-Capillary: 161 mg/dL — ABNORMAL HIGH (ref 70–99)
Glucose-Capillary: 183 mg/dL — ABNORMAL HIGH (ref 70–99)

## 2021-12-10 MED ORDER — HYDRALAZINE HCL 25 MG PO TABS: 25.0000 mg | ORAL_TABLET | Freq: Four times a day (QID) | ORAL | Status: DC | PRN

## 2021-12-10 NOTE — Consult Note (Signed)
WOC Nurse Consult Note: Reason for Consult:full thickness wound to left lateral stump Wound type:full thickness Pressure Injury POA: N/A Measurement:2cm x 1.5cm x 0.5cm Wound bed:red, moist Drainage (amount, consistency, odor) moderate serous to light yellow exudate Periwound:intact Dressing procedure/placement/frequency: I have provided Nursing with guidance for topicl care using a calcium alginate dressing once daily for absorption and provision of optimal wound healing environment. PI prevention measures are provided such as turning and repositioning guidance to minimize time in the supine position, a pressure redistribution heel boot and a sacral prophylactic foam dressing.Cleansing and moisturizing of the RLE is to be performed daily.  [place] nursing team will not follow, but will remain available to this patient, the nursing and medical teams.  Please re-consult if needed.  Thank you for inviting [place] to participate in this patient's Plan of Care.  Maudie Flakes, MSN, RN, CNS, [place], Serita Grammes, [employer], [employer] phone:  [phone]

## 2021-12-10 NOTE — TOC Initial Note (Signed)
Transition of Care [facility]) - Initial/Assessment Note    Patient Details  Name: [name] MRN: [id number] Date of Birth: 02/11/1961  Transition of [facility]) CM/SW Contact:    Vassie Moselle, LCSW Phone Number: 12/10/2021, 8:44 AM  Clinical Narrative:                 Met with pt and confirmed plan for him to return to [facility] at discharge. CSW spoke with [facility] and confirmed pt is a long term care resident at their facility and is able to return once medically clear.   Expected Discharge Plan: Assisted Living Barriers to Discharge: Continued Medical Work up   Patient Goals and CMS Choice Patient states their goals for this hospitalization and ongoing recovery are:: To [email or web address] Compare Post Acute Care list provided to:: Patient Choice offered to / list presented to : Patient  Expected Discharge Plan and Services Expected Discharge Plan: Assisted Living In-house Referral: NA Discharge Planning Services: CM Consult Post Acute Care Choice: Nursing Home Living arrangements for the past 2 months: [place]                 DME Arranged: N/A DME Agency: NA                  Prior Living Arrangements/Services Living arrangements for the past 2 months: [place] Lives with:: Facility Resident Patient language and need for interpreter reviewed:: Yes Do you feel safe going back to the place where you live?: Yes      Need for Family Participation in Patient Care: No (Comment) Care giver support system in place?: Yes (comment) Current home services: DME Criminal Activity/Legal Involvement Pertinent to Current Situation/Hospitalization: No - Comment as needed  Activities of Daily Living Home Assistive Devices/Equipment: Wheelchair ADL Screening (condition at time of admission) Patient's cognitive ability adequate to safely complete daily activities?: Yes Is the patient deaf or have  difficulty hearing?: No Does the patient have difficulty seeing, even when wearing glasses/contacts?: Yes Does the patient have difficulty concentrating, remembering, or making decisions?: No Patient able to express need for assistance with ADLs?: Yes Does the patient have difficulty dressing or bathing?: Yes Independently performs ADLs?: No Communication: Independent Dressing (OT): Needs assistance Is this a change from baseline?: Pre-admission baseline Grooming: Needs assistance Is this a change from baseline?: Pre-admission baseline Feeding: Needs assistance Is this a change from baseline?: Pre-admission baseline Bathing: Needs assistance Is this a change from baseline?: Pre-admission baseline Toileting: Needs assistance Is this a change from baseline?: Pre-admission baseline In/Out Bed: Needs assistance Is this a change from baseline?: Pre-admission baseline Walks in Home: Needs assistance Is this a change from baseline?: Pre-admission baseline Does the patient have difficulty walking or climbing stairs?: Yes Weakness of Legs: Right Weakness of Arms/Hands: Right  Permission Sought/Granted Permission sought to share information with : Chartered certified accountant granted to share information with : Yes, Verbal Permission Granted     Permission granted to share info w AGENCY: [facility]        Emotional Assessment Appearance:: Appears stated age Attitude/Demeanor/Rapport: Engaged Affect (typically observed): Accepting Orientation: : Oriented to Self, Oriented to Place, Oriented to  Time, Oriented to Situation Alcohol / Substance Use: Not Applicable Psych Involvement: No (comment)  Admission diagnosis:  Right upper lobe pneumonia [J18.9] Pneumonia of right upper lobe due to infectious organism [J18.9] Bacteremia [R78.81] Patient Active Problem List   Diagnosis Date  Noted   Bacteremia 12/09/2021   Right upper lobe pneumonia 12/08/2021   Swelling of  arm 15/06/6977   Metabolic acidosis 48/02/6551   Community acquired pneumonia    Multiple thyroid nodules 10/19/2021   Chronic heart failure with preserved ejection fraction (HFpEF) ([place]) 10/19/2021   Diabetic foot infection ([place]) 03/01/2021   PAD (peripheral artery disease) ([place]) 03/01/2021   Glaucoma (increased eye pressure) 03/01/2021   Pressure injury of skin 11/19/2020   Ileus ([place]) 11/18/2020   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   Depression with anxiety 11/18/2020   Acute renal failure superimposed on stage 3a chronic kidney disease ([place]) 11/18/2020   BPH (benign prostatic hyperplasia) 11/18/2020   CVA ([place]) with residual right-sided weakness 08/22/2020   Pericardial effusion 08/22/2020   Acute on chronic diastolic CHF (congestive heart failure) ([place]) 08/22/2020   TIA (transient ischemic attack) 07/03/2020   Acute on chronic heart failure with preserved ejection fraction ([place])    Pulmonary artery hypertension ([place]) 06/25/2020   CKD (chronic kidney disease) stage 4, GFR 15-29 ml/min ([place]) 06/25/2020   History of cerebrovascular accident (CVA) with residual deficit 06/25/2020   CKD stage IIIa ([place]) 03/30/2020   Morbid (severe) obesity due to excess calories ([place]) 03/30/2020   Malnutrition of moderate degree 01/25/2020   Palliative care by specialist    Goals of care, counseling/discussion    Nephrotic syndrome 01/06/2020   Anasarca 01/06/2020   Hypokalemia due to loss of potassium 01/04/2020   Generalized body aches 01/01/2020   Non-traumatic rhabdomyolysis 01/01/2020   Chronic diastolic CHF (congestive heart failure) ([place]) 01/01/2020   Iron deficiency anemia 01/01/2020   Elevated CK    Uncontrolled type 2 diabetes mellitus with hyperglycemia ([place])    Pure hypercholesterolemia    Hyperglycemia 09/28/2017   Nonobstructive atherosclerosis of coronary artery 09/28/2017   MDD (major depressive disorder), recurrent episode, severe ([place]) 07/30/2017   Osteoarthritis 04/12/2015    Severe recurrent major depression with psychotic features ([place]) 01/04/2015   Substance-related disorder ([place]) 10/30/2014   Patient's noncompliance with other medical treatment and regimen 06/07/2014   Cocaine dependence, uncomplicated ([place]) 74/82/7078   Chronic pain 05/31/2014   Cocaine abuse ([place]) 05/31/2014   History of noncompliance with medical treatment 05/31/2014   Malingering 05/31/2014   Leukopenia 05/31/2014   Diabetic neuropathy associated with diabetes mellitus due to underlying condition ([place]) 05/31/2014   Diabetic neuropathy ([place]) 05/31/2014   Depression, major, recurrent, severe with psychosis ([place]) 08/29/2013   Suicidal ideations 08/28/2013   Type 2 diabetes mellitus ([place]) 06/30/2013   Atypical chest pain 12/09/2011   Hypertension 12/09/2011   Episodic mood disorder ([place]) 05/13/2011   PCP:  Jodi Marble, MD Pharmacy:   [place], [place] - [address] [place] [address] Phone: [phone] Fax: [phone]     Social Determinants of Health ([place]) Interventions    Readmission Risk Interventions    12/10/2021    8:41 AM 01/12/2020    3:39 PM  Readmission Risk Prevention Plan  Transportation Screening Complete Complete  Palliative Care Screening  Not Applicable  Medication Review (RN Care Manager) Complete   PCP or Specialist appointment within 3-5 days of discharge Complete   HRI or Home Care Consult Complete   SW Recovery Care/Counseling Consult Complete   Palliative Care Screening Not Applicable   Skilled Nursing Facility Complete

## 2021-12-10 NOTE — Progress Notes (Signed)
  Echocardiogram 2D Echocardiogram has been performed.  [name] M 12/10/2021, 1:34 PM

## 2021-12-10 NOTE — Progress Notes (Signed)
PROGRESS NOTE    [name]  SJG:[id number] DOB: [birth year] DOA: 12/08/2021 PCP: Jodi Marble, MD    Brief Narrative:   [name] is a 61 y.o. male with past medical history significant for HFpEF (EF 60-65%, G2DD by TTE 10/21/2021), CKD stage IV, history of CVA with right hemiparesis, IDT2DM, HTN, HLD, PAD s/p left BKA, iron deficiency anemia, depression/anxiety who presented to [facility] ED on 10/14 from [place] [facility] for evaluation of fever and chills. Patient reports new onset of chills and diaphoresis beginning yesterday.  He had an episode of nausea with vomiting but is not sure if he aspirated.  He is not sure if he has had any fevers.  He denies any chest pain, dyspnea, cough, abdominal pain.  He reports good urine output.  He reports chronic right-sided weakness due to prior stroke which is unchanged.   In the ED, BP 179/94, pulse 105, RR 22, temp 99.6 F, SPO2 98% on room air. WBC 9.8, hemoglobin 10.1, platelets 188,000, sodium 139, potassium 5.1, bicarb 20, BUN 39, creatinine 2.53, serum glucose 126, LFTs within normal limits, lactic acid 1.0. SARS-CoV-2 PCR negative.  Blood cultures and Urinalysis pending collection. 2 view chest x-ray showed cardiomegaly with central pulmonary vascular congestion and patchy airspace opacities in the right upper lobe. Patient was given 500 cc LR and IV cefepime.  The hospitalist service was consulted to admit for further evaluation and management.  Assessment & Plan:   Enterococcus faecalis septicemia Patient presenting with chills, blood cultures 1 out of 4 positive for Enterococcus faecalis. -- ID following, appreciate assistance -- Blood cultures x2: 1/4 + Enterococcus faecalis -- Repeat blood cultures 10/16: Pending -- Continue ampicillin 2 g IV q6h -- TTE: pending  Right upper lobe pneumonia, ruled out Patient diaphoretic with chills.  CXR suggestive of RUL pneumonia.  COVID is negative.  Slightly elevated pulse and respiratory rate  on arrival but not with subsequent readings.  Sepsis not ruled in on admission.  Pneumonia ruled out as his symptoms on presentation more likely related to his Enterococcus septicemia as above.  Was initially started on azithromycin and ceftriaxone, now de-escalated to ampicillin as above.   Chronic heart failure with preserved ejection fraction (HFpEF) (HCC) Some pulmonary vascular congestion on CXR.  EF is 60-65% by TTE 10/21/2021. Currently saturating well on room air. --Resumed home Lasix 80 mg BID in a.m. --Strict I/O's and daily weights --Hold K supplement due to borderline high level --Repeat BMP in a.m.   CKD (chronic kidney disease) stage 4, GFR 15-29 ml/min (HCC) Renal function slightly improved from recent baseline, 3.0 - 3.8 --Cr 2.53>2.51>2.61 --Sodium bicarbonate 1300 mg p.o. twice daily --BMP daily   Hypertension Continue amlodipine 5 mg p.o. daily and Lasix 80 mg p.o. twice daily.   Type 2 diabetes mellitus (HCC) On Lantus 15 units immensely daily at baseline.  Hemoglobin A1c 5.8 in 10/21/2021, well controlled. --Hold long-acting insulin for now --Sensitive SSI for coverage --CBGs qAC/HS.   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  Continue aspirin, statin.   PAD (peripheral artery disease) (HCC) S/p left BKA.  Continue aspirin and statin.   BPH (benign prostatic hyperplasia) Continue Flomax.   Depression with anxiety Continue sertraline.  Left BKA stump pressure injury, POA Pressure Injury 12/08/21 Other (Comment) Left;Posterior Unstageable - Full thickness tissue loss in which the base of the injury is covered by slough (yellow, tan, gray, green or brown) and/or  eschar (tan, brown or black) in the wound bed. (Active)  12/08/21 2300  Location: Other (Comment) (Left BKA Stump)  Location Orientation: Left;Posterior  Staging: Unstageable - Full thickness tissue loss in which the base of the  injury is covered by slough (yellow, tan, gray, green or brown) and/or eschar (tan, brown or black) in the wound bed.  Wound Description (Comments):   Present on Admission: Yes  --Wound RN consulted, continue local wound care, offloading --Wound care to left lateral stump full thickness wound:  Cleanse with soap and water, rinse and dry. Fill defect with size appropriate piece of calcium alginate dressing, top with dry gauze and secure with silicone foam dressing. Change daily.   DVT prophylaxis: heparin injection 5,000 Units Start: 12/09/21 0600    Code Status: Full Code Family Communication: No family present at bedside this morning  Disposition Plan:  Level of care: Telemetry Status is: Inpatient Remains inpatient appropriate because: IV antibiotics, [place] medically [place] and ID signed off      Consultants:  Infectious disease  Procedures:  TTE: Pending  Antimicrobials:  Cefepime 10/14 - 10/14 Azithromycin 10/14 - 10/15 Ceftriaxone 10/15 - 10/15 Ampicillin 10/15>>   Subjective: Patient seen examined bedside, resting comfortably.  Sleeping but easily arousable.  Does not want to be disturbed and would like to go back to sleep.  No other specific questions or concerns at this time.  Denies fever, no headache, no chest pain, no shortness of breath, no abdominal pain.  No acute events overnight per nursing staff.    Objective: Vitals:   12/09/21 1354 12/09/21 2038 12/10/21 0454 12/10/21 0458  BP: (!) 176/105 (!) 143/92 (!) 161/83   Pulse: 82 77 82   Resp: '17 18 15   '$ Temp: 98.1 F (36.7 C) 99 F (37.2 C) 98.6 F (37 C)   TempSrc: Oral  Oral   SpO2: 100% 99% 100%   Weight:    106.7 kg  Height:        Intake/Output Summary (Last 24 hours) at 12/10/2021 1153 Last data filed at 12/10/2021 0515 Gross per 24 hour  Intake 1020 ml  Output 1450 ml  Net -430 ml   Filed Weights   12/08/21 1840 12/09/21 0500 12/10/21 0458  Weight:  81.6 kg 96.2 kg 106.7 kg    Examination:  Physical Exam: GEN: NAD, alert and oriented x 3, chronically ill in appearance, appears older than stated age, irritable HEENT: NCAT, PERRL, EOMI, sclera clear, MMM, poor dentition PULM: CTAB w/o wheezes/crackles, normal respiratory effort CV: RRR w/o M/G/R GI: abd soft, NTND, NABS, no R/G/M MSK: 1+ right lower extremity peripheral edema, noted left BKA, diminished muscle strength right upper/lower extremity due to prior CVA NEURO: CN II-XII intact, no focal deficits, sensation to light touch intact PSYCH: Depressed mood, flat affect, irritable Integumentary: Left posterior stump wound noted, otherwise no other concerning rashes/lesions/wounds noted on exposed skin surfaces.     Data Reviewed: I have personally reviewed following labs and imaging studies  CBC: Recent Labs  Lab 12/08/21 1919 12/09/21 0526  WBC 9.8 9.5  NEUTROABS 8.7*  --   HGB 10.1* 9.2*  HCT 33.0* 30.1*  MCV 82.3 83.1  PLT 188 347   Basic Metabolic Panel: Recent Labs  Lab 12/08/21 1919 12/09/21 0526 12/10/21 1004  NA 139 140 137  K 5.1 4.6 3.7  CL 115* 115* 111  CO2 20* 20* 19*  GLUCOSE 126* 133* 136*  BUN 39* 41*  44*  CREATININE 2.53* 2.51* 2.61*  CALCIUM 8.4* 8.3* 8.0*   GFR: Estimated Creatinine Clearance: 35.6 mL/min (A) (by C-G formula based on SCr of 2.61 mg/dL (H)). Liver Function Tests: Recent Labs  Lab 12/08/21 1919  AST 23  ALT 23  ALKPHOS 124  BILITOT 0.6  PROT 7.1  ALBUMIN 2.6*   No results for input(s): "LIPASE", "AMYLASE" in the last 168 hours. No results for input(s): "AMMONIA" in the last 168 hours. Coagulation Profile: Recent Labs  Lab 12/08/21 1919  INR 1.3*   Cardiac Enzymes: No results for input(s): "CKTOTAL", "CKMB", "CKMBINDEX", "TROPONINI" in the last 168 hours. BNP (last 3 results) No results for input(s): "PROBNP" in the last 8760 hours. HbA1C: No results for input(s): "HGBA1C" in the last 72  hours. CBG: Recent Labs  Lab 12/09/21 0756 12/09/21 1154 12/09/21 1701 12/09/21 2035 12/10/21 1142  GLUCAP 107* 129* 176* 164* 141*   Lipid Profile: No results for input(s): "CHOL", "HDL", "LDLCALC", "TRIG", "CHOLHDL", "LDLDIRECT" in the last 72 hours. Thyroid Function Tests: No results for input(s): "TSH", "T4TOTAL", "FREET4", "T3FREE", "THYROIDAB" in the last 72 hours. Anemia Panel: No results for input(s): "VITAMINB12", "FOLATE", "FERRITIN", "TIBC", "IRON", "RETICCTPCT" in the last 72 hours. Sepsis Labs: Recent Labs  Lab 12/08/21 1919 12/08/21 2130  LATICACIDVEN 1.0 0.8    Recent Results (from the past 240 hour(s))  SARS Coronavirus 2 by RT PCR (hospital order, performed in [facility] hospital lab) *cepheid single result test* Anterior Nasal Swab     Status: None   Collection Time: 12/08/21  7:12 PM   Specimen: Anterior Nasal Swab  Result Value Ref Range Status   SARS Coronavirus 2 by RT PCR NEGATIVE NEGATIVE Final    Comment: (NOTE) SARS-CoV-2 target nucleic acids are NOT DETECTED.  The SARS-CoV-2 RNA is generally detectable in upper and lower respiratory specimens during the acute phase of infection. The lowest concentration of SARS-CoV-2 viral copies this assay can detect is 250 copies / mL. A negative result does not preclude SARS-CoV-2 infection and should not be used as the sole basis for treatment or other patient management decisions.  A negative result may occur with improper specimen collection / handling, submission of specimen other than nasopharyngeal swab, presence of viral mutation(s) within the areas targeted by this assay, and inadequate number of viral copies (<250 copies / mL). A negative result must be combined with clinical observations, patient history, and epidemiological information.  Fact Sheet for Patients:   https://www.patel.info/  Fact Sheet for Healthcare Providers: https://hall.com/  This  test is not yet approved or  cleared by the [place] FDA and has been authorized for detection and/or diagnosis of SARS-CoV-2 by FDA under an Emergency Use Authorization (EUA).  This EUA will remain in effect (meaning this test can be used) for the duration of the COVID-19 declaration under Section 564(b)(1) of the Act, 21 U.S.C. section 360bbb-3(b)(1), unless the authorization is terminated or revoked sooner.  Performed at [facility], [place] [address] [place], [place] [address]   Culture, blood (Routine x 2)     Status: Abnormal (Preliminary result)   Collection Time: 12/08/21  7:19 PM   Specimen: BLOOD LEFT FOREARM  Result Value Ref Range Status   Specimen Description   Final    BLOOD LEFT FOREARM Performed at [place] [address], [place], [place] [address]    Special Requests   Final    BOTTLES DRAWN AEROBIC AND ANAEROBIC Blood Culture results may not  be optimal due to an excessive volume of blood received in culture bottles Performed at [place] [address], [place], [place] [address]    Culture  Setup Time   Final    GRAM POSITIVE COCCI IN CHAINS ANAEROBIC BOTTLE ONLY CRITICAL RESULT CALLED TO, READ BACK BY AND VERIFIED WITH: PHARMD DREW W.  [id number] FCP    Culture (A)  Final    ENTEROCOCCUS FAECALIS SUSCEPTIBILITIES TO FOLLOW Performed at [facility], [place] [address] [place], [place] [address]    Report Status PENDING  Incomplete  Blood Culture ID Panel (Reflexed)     Status: Abnormal   Collection Time: 12/08/21  7:19 PM  Result Value Ref Range Status   Enterococcus faecalis DETECTED (A) NOT DETECTED Final    Comment: CRITICAL RESULT CALLED TO, READ BACK BY AND VERIFIED WITH: PHARMD DREW W.  [id number] FCP    Enterococcus Faecium NOT DETECTED NOT DETECTED Final   Listeria monocytogenes NOT DETECTED NOT DETECTED Final   Staphylococcus species NOT DETECTED NOT DETECTED Final    Staphylococcus aureus (BCID) NOT DETECTED NOT DETECTED Final   Staphylococcus epidermidis NOT DETECTED NOT DETECTED Final   Staphylococcus lugdunensis NOT DETECTED NOT DETECTED Final   Streptococcus species NOT DETECTED NOT DETECTED Final   Streptococcus agalactiae NOT DETECTED NOT DETECTED Final   Streptococcus pneumoniae NOT DETECTED NOT DETECTED Final   Streptococcus pyogenes NOT DETECTED NOT DETECTED Final   A.calcoaceticus-baumannii NOT DETECTED NOT DETECTED Final   Bacteroides fragilis NOT DETECTED NOT DETECTED Final   Enterobacterales NOT DETECTED NOT DETECTED Final   Enterobacter cloacae complex NOT DETECTED NOT DETECTED Final   Escherichia coli NOT DETECTED NOT DETECTED Final   Klebsiella aerogenes NOT DETECTED NOT DETECTED Final   Klebsiella oxytoca NOT DETECTED NOT DETECTED Final   Klebsiella pneumoniae NOT DETECTED NOT DETECTED Final   Proteus species NOT DETECTED NOT DETECTED Final   Salmonella species NOT DETECTED NOT DETECTED Final   Serratia marcescens NOT DETECTED NOT DETECTED Final   Haemophilus influenzae NOT DETECTED NOT DETECTED Final   Neisseria meningitidis NOT DETECTED NOT DETECTED Final   Pseudomonas aeruginosa NOT DETECTED NOT DETECTED Final   Stenotrophomonas maltophilia NOT DETECTED NOT DETECTED Final   Candida albicans NOT DETECTED NOT DETECTED Final   Candida auris NOT DETECTED NOT DETECTED Final   Candida glabrata NOT DETECTED NOT DETECTED Final   Candida krusei NOT DETECTED NOT DETECTED Final   Candida parapsilosis NOT DETECTED NOT DETECTED Final   Candida tropicalis NOT DETECTED NOT DETECTED Final   Cryptococcus neoformans/gattii NOT DETECTED NOT DETECTED Final   Vancomycin resistance NOT DETECTED NOT DETECTED Final    Comment: Performed at [facility], [address] [place], [place] [address]  Culture, blood (Routine x 2)     Status: None (Preliminary result)   Collection Time: 12/08/21  7:40 PM   Specimen: Left Antecubital; Blood  Result  Value Ref Range Status   Specimen Description   Final    LEFT ANTECUBITAL BLOOD Performed at [facility], [place] [address] [place], [place] [address]    Special Requests   Final    BOTTLES DRAWN AEROBIC AND ANAEROBIC Blood Culture results may not be optimal due to an excessive volume of blood received in culture bottles Performed at [place] [address], [place], [place] [address]    Culture   Final    NO GROWTH 2 DAYS Performed at [facility],  [address], [place], [place] [address]    Report Status PENDING  Incomplete         Radiology Studies: DG Chest 2 View  Result Date: 12/08/2021 CLINICAL DATA:  Suspected sepsis EXAM: CHEST - 2 VIEW COMPARISON:  Chest x-ray 10/19/2021 FINDINGS: The heart is enlarged. There is central pulmonary vascular congestion. There are patchy airspace opacities in the right upper lobe. No pleural effusion or pneumothorax identified. No acute fractures are seen. IMPRESSION: 1. Cardiomegaly with central pulmonary vascular congestion. 2. Patchy airspace opacities in the right upper lobe which may represent infection or asymmetric edema. Electronically Signed   By: Ronney Asters M.D.   On: 12/08/2021 19:16        Scheduled Meds:  amLODipine  5 mg Oral Daily   aspirin  81 mg Oral Daily   atorvastatin  80 mg Oral QHS   brimonidine  1 drop Both Eyes BID   docusate sodium  100 mg Oral QHS   dorzolamide-timolol  1 drop Both Eyes BID   furosemide  80 mg Oral BID   heparin  5,000 Units Subcutaneous Q8H   insulin aspart  0-9 Units Subcutaneous TID WC   latanoprost  1 drop Both Eyes QHS   pantoprazole  40 mg Oral Daily   sertraline  50 mg Oral QPM   sodium bicarbonate  1,300 mg Oral BID   sodium chloride flush  3 mL Intravenous Q12H   tamsulosin  0.8 mg Oral QPM   Continuous Infusions:  ampicillin (OMNIPEN) IV 2 g (12/10/21 1058)     LOS: 1 day    Time spent: 52 minutes spent on chart review, discussion  with nursing staff, consultants, updating family and interview/physical exam; more than 50% of that time was spent in counseling and/or coordination of care.    Avedis Bevis J [place] DO [facility] Available via Epic secure chat 7am-7pm After these hours, please refer to coverage provider listed on [email or web address] 12/10/2021, 11:53 AM

## 2021-12-10 NOTE — NC FL2 (Signed)
[place] LEVEL OF CARE SCREENING TOOL     IDENTIFICATION  Patient Name: [name] Birthdate: 1960/06/29 Sex: male Admission Date (Current Location): 12/08/2021  [place] and [place] Number:  Kathleen Argue [phone] [place] and Address:  [facility],  [place], [place]      Provider Number: [id number]  Attending Physician Name and Address:  [place] Eric J, DO  Relative Name and Phone Number:  Emmerich, Cryer [phone]    Current Level of Care: Hospital Recommended Level of Care: [place] Prior Approval Number:    Date Approved/Denied:   PASRR Number:    Discharge Plan: Other (Comment) (ALF)    Current Diagnoses: Patient Active Problem List   Diagnosis Date Noted   Bacteremia 12/09/2021   Right upper lobe pneumonia 12/08/2021   Swelling of arm 56/81/2751   Metabolic acidosis 70/02/7492   Community acquired pneumonia    Multiple thyroid nodules 10/19/2021   Chronic heart failure with preserved ejection fraction (HFpEF) ([place]) 10/19/2021   Diabetic foot infection ([place]) 03/01/2021   PAD (peripheral artery disease) ([place]) 03/01/2021   Glaucoma (increased eye pressure) 03/01/2021   Pressure injury of skin 11/19/2020   Ileus ([place]) 11/18/2020   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   Depression with anxiety 11/18/2020   Acute renal failure superimposed on stage 3a chronic kidney disease ([place]) 11/18/2020   BPH (benign prostatic hyperplasia) 11/18/2020   CVA ([place]) with residual right-sided weakness 08/22/2020   Pericardial effusion 08/22/2020   Acute on chronic diastolic CHF (congestive heart failure) ([place]) 08/22/2020   TIA (transient ischemic attack) 07/03/2020   Acute on chronic heart failure with preserved ejection fraction ([place])    Pulmonary artery hypertension ([place]) 06/25/2020   CKD (chronic kidney disease) stage 4, GFR 15-29 ml/min ([place]) 06/25/2020   History of cerebrovascular accident (CVA) with residual  deficit 06/25/2020   CKD stage IIIa ([place]) 03/30/2020   Morbid (severe) obesity due to excess calories ([place]) 03/30/2020   Malnutrition of moderate degree 01/25/2020   Palliative care by specialist    Goals of care, counseling/discussion    Nephrotic syndrome 01/06/2020   Anasarca 01/06/2020   Hypokalemia due to loss of potassium 01/04/2020   Generalized body aches 01/01/2020   Non-traumatic rhabdomyolysis 01/01/2020   Chronic diastolic CHF (congestive heart failure) ([place]) 01/01/2020   Iron deficiency anemia 01/01/2020   Elevated CK    Uncontrolled type 2 diabetes mellitus with hyperglycemia ([place])    Pure hypercholesterolemia    Hyperglycemia 09/28/2017   Nonobstructive atherosclerosis of coronary artery 09/28/2017   MDD (major depressive disorder), recurrent episode, severe ([place]) 07/30/2017   Osteoarthritis 04/12/2015   Severe recurrent major depression with psychotic features ([place]) 01/04/2015   Substance-related disorder ([place]) 10/30/2014   Patient's noncompliance with other medical treatment and regimen 06/07/2014   Cocaine dependence, uncomplicated ([place]) 49/67/5916   Chronic pain 05/31/2014   Cocaine abuse ([place]) 05/31/2014   History of noncompliance with medical treatment 05/31/2014   Malingering 05/31/2014   Leukopenia 05/31/2014   Diabetic neuropathy associated with diabetes mellitus due to underlying condition ([place]) 05/31/2014   Diabetic neuropathy ([place]) 05/31/2014   Depression, major, recurrent, severe with psychosis ([place]) 08/29/2013   Suicidal ideations 08/28/2013   Type 2 diabetes mellitus ([place]) 06/30/2013   Atypical chest pain 12/09/2011   Hypertension 12/09/2011   Episodic mood disorder ([place]) 05/13/2011    Orientation RESPIRATION BLADDER Height & Weight     Self, Time, Situation, Place  Normal Continent Weight: 235 lb 3.7 oz (  106.7 kg) Height:  '5\' 8"'$  (172.7 cm)  BEHAVIORAL SYMPTOMS/MOOD NEUROLOGICAL BOWEL NUTRITION STATUS      Continent Diet (Regular)   AMBULATORY STATUS COMMUNICATION OF NEEDS Skin   Limited Assist   Normal                       Personal Care Assistance Level of Assistance  Bathing, Feeding, Dressing Bathing Assistance: Limited assistance Feeding assistance: Limited assistance Dressing Assistance: Limited assistance     Functional Limitations Info  Sight, Hearing, Speech Sight Info: Impaired Hearing Info: Adequate Speech Info: Adequate    SPECIAL CARE FACTORS FREQUENCY  PT (By licensed PT), OT (By licensed OT)                    Contractures Contractures Info: Not present    Additional Factors Info  Code Status, Allergies Code Status Info: FULL Allergies Info: Grits           Current Medications (12/10/2021):  This is the current hospital active medication list Current Facility-Administered Medications  Medication Dose Route Frequency Provider Last Rate Last Admin   acetaminophen (TYLENOL) tablet 650 mg  650 mg Oral Q6H PRN Lenore Cordia, MD   650 mg at 12/09/21 0546   Or   acetaminophen (TYLENOL) suppository 650 mg  650 mg Rectal Q6H PRN Lenore Cordia, MD       amLODipine (NORVASC) tablet 5 mg  5 mg Oral Daily Zada Finders R, MD   5 mg at 12/09/21 1110   ampicillin (OMNIPEN) 2 g in sodium chloride 0.9 % 100 mL IVPB  2 g Intravenous Q6H [place] Eric J, DO 300 mL/hr at 12/10/21 0458 2 g at 12/10/21 0458   aspirin chewable tablet 81 mg  81 mg Oral Daily Zada Finders R, MD   81 mg at 12/09/21 1110   atorvastatin (LIPITOR) tablet 80 mg  80 mg Oral QHS Zada Finders R, MD   80 mg at 12/09/21 2041   brimonidine (ALPHAGAN) 0.2 % ophthalmic solution 1 drop  1 drop Both Eyes BID [place] Eric J, DO   1 drop at 12/09/21 2040   docusate sodium (COLACE) capsule 100 mg  100 mg Oral QHS Patel, Vishal R, MD       dorzolamide-timolol (COSOPT) 2-0.5 % ophthalmic solution 1 drop  1 drop Both Eyes BID [place] Donnamarie Poag, DO   1 drop at 12/09/21 2039   furosemide (LASIX) tablet 80 mg  80 mg Oral BID Zada Finders R, MD   80 mg at 12/09/21 1740   heparin injection 5,000 Units  5,000 Units Subcutaneous Q8H Zada Finders R, MD   5,000 Units at 12/10/21 0458   hydrALAZINE (APRESOLINE) injection 10 mg  10 mg Intravenous Q4H PRN Zada Finders R, MD       insulin aspart (novoLOG) injection 0-9 Units  0-9 Units Subcutaneous TID WC Zada Finders R, MD   2 Units at 12/09/21 1740   latanoprost (XALATAN) 0.005 % ophthalmic solution 1 drop  1 drop Both Eyes QHS [place] Donnamarie Poag, DO   1 drop at 12/09/21 2040   ondansetron (ZOFRAN) tablet 4 mg  4 mg Oral Q6H PRN Lenore Cordia, MD       Or   ondansetron (ZOFRAN) injection 4 mg  4 mg Intravenous Q6H PRN Lenore Cordia, MD       pantoprazole (PROTONIX) EC tablet 40 mg  40 mg Oral Daily Lenore Cordia, MD  40 mg at 12/09/21 1110   senna-docusate (Senokot-S) tablet 1 tablet  1 tablet Oral QHS PRN Lenore Cordia, MD       sertraline (ZOLOFT) tablet 50 mg  50 mg Oral QPM Zada Finders R, MD   50 mg at 12/09/21 1740   sodium bicarbonate tablet 1,300 mg  1,300 mg Oral BID Zada Finders R, MD   1,300 mg at 12/09/21 2041   sodium chloride flush (NS) 0.9 % injection 3 mL  3 mL Intravenous Q12H Zada Finders R, MD   3 mL at 12/09/21 2042   tamsulosin (FLOMAX) capsule 0.8 mg  0.8 mg Oral QPM Lenore Cordia, MD   0.8 mg at 12/09/21 1740     Discharge Medications: Please see discharge summary for a list of discharge medications.  Relevant Imaging Results:  Relevant Lab Results:   Additional Information SSN [id number]  Vassie Moselle, LCSW

## 2021-12-10 NOTE — Progress Notes (Signed)
[facility]   Reason for visit: Follow up on positive blood culture  Interval History: 1/3 bottles positive for Enterococcus faecalis; repeat blood culture sent. Remains afebrile and WBC wnl.   He reports sweating at night  Physical Exam: Constitutional:  Vitals:   12/09/21 2038 12/10/21 0454  BP: (!) 143/92 (!) 161/83  Pulse: 77 82  Resp: 18 15  Temp: 99 F (37.2 C) 98.6 F (37 C)  SpO2: 99% 100%   patient appears in NAD Respiratory: Normal respiratory effort  Review of Systems: Constitutional: negative for fevers and chills  Lab Results  Component Value Date   WBC 9.5 12/09/2021   HGB 9.2 (L) 12/09/2021   HCT 30.1 (L) 12/09/2021   MCV 83.1 12/09/2021   PLT 171 12/09/2021    Lab Results  Component Value Date   CREATININE 2.61 (H) 12/10/2021   BUN 44 (H) 12/10/2021   NA 137 12/10/2021   K 3.7 12/10/2021   CL 111 12/10/2021   CO2 19 (L) 12/10/2021    Lab Results  Component Value Date   ALT 23 12/08/2021   AST 23 12/08/2021   ALKPHOS 124 12/08/2021     Microbiology: Recent Results (from the past 240 hour(s))  SARS Coronavirus 2 by RT PCR (hospital order, performed in [facility] hospital lab) *cepheid single result test* Anterior Nasal Swab     Status: None   Collection Time: 12/08/21  7:12 PM   Specimen: Anterior Nasal Swab  Result Value Ref Range Status   SARS Coronavirus 2 by RT PCR NEGATIVE NEGATIVE Final    Comment: (NOTE) SARS-CoV-2 target nucleic acids are NOT DETECTED.  The SARS-CoV-2 RNA is generally detectable in upper and lower respiratory specimens during the acute phase of infection. The lowest concentration of SARS-CoV-2 viral copies this assay can detect is 250 copies / mL. A negative result does not preclude SARS-CoV-2 infection and should not be used as the sole basis for treatment or other patient management decisions.  A negative result may occur with improper specimen collection / handling, submission of  specimen other than nasopharyngeal swab, presence of viral mutation(s) within the areas targeted by this assay, and inadequate number of viral copies (<250 copies / mL). A negative result must be combined with clinical observations, patient history, and epidemiological information.  Fact Sheet for Patients:   https://www.patel.info/  Fact Sheet for Healthcare Providers: https://hall.com/  This test is not yet approved or  cleared by the [place] FDA and has been authorized for detection and/or diagnosis of SARS-CoV-2 by FDA under an Emergency Use Authorization (EUA).  This EUA will remain in effect (meaning this test can be used) for the duration of the COVID-19 declaration under Section 564(b)(1) of the Act, 21 U.S.C. section 360bbb-3(b)(1), unless the authorization is terminated or revoked sooner.  Performed at [facility], [place] [address], [place], [place] [address]   Culture, blood (Routine x 2)     Status: Abnormal (Preliminary result)   Collection Time: 12/08/21  7:19 PM   Specimen: BLOOD LEFT FOREARM  Result Value Ref Range Status   Specimen Description   Final    BLOOD LEFT FOREARM Performed at [facility] [address], [place], [place] [address]    Special Requests   Final    BOTTLES DRAWN AEROBIC AND ANAEROBIC Blood Culture results may not be optimal due to an excessive volume of blood received in culture bottles Performed at [facility], [place] [address], [place], [place] [address]    Culture  Setup Time   Final    GRAM POSITIVE COCCI IN CHAINS ANAEROBIC BOTTLE ONLY CRITICAL RESULT CALLED TO, READ BACK BY AND VERIFIED WITH: PHARMD DREW W.  [phone] [id number] FCP    Culture (A)  Final    ENTEROCOCCUS FAECALIS SUSCEPTIBILITIES TO FOLLOW Performed at [facility], [place] [address] [place], [place] [address]    Report Status PENDING  Incomplete  Blood Culture  ID Panel (Reflexed)     Status: Abnormal   Collection Time: 12/08/21  7:19 PM  Result Value Ref Range Status   Enterococcus faecalis DETECTED (A) NOT DETECTED Final    Comment: CRITICAL RESULT CALLED TO, READ BACK BY AND VERIFIED WITH: PHARMD DREW W.  [phone] FCP    Enterococcus Faecium NOT DETECTED NOT DETECTED Final   Listeria monocytogenes NOT DETECTED NOT DETECTED Final   Staphylococcus species NOT DETECTED NOT DETECTED Final   Staphylococcus aureus (BCID) NOT DETECTED NOT DETECTED Final   Staphylococcus epidermidis NOT DETECTED NOT DETECTED Final   Staphylococcus lugdunensis NOT DETECTED NOT DETECTED Final   Streptococcus species NOT DETECTED NOT DETECTED Final   Streptococcus agalactiae NOT DETECTED NOT DETECTED Final   Streptococcus pneumoniae NOT DETECTED NOT DETECTED Final   Streptococcus pyogenes NOT DETECTED NOT DETECTED Final   A.calcoaceticus-baumannii NOT DETECTED NOT DETECTED Final   Bacteroides fragilis NOT DETECTED NOT DETECTED Final   Enterobacterales NOT DETECTED NOT DETECTED Final   Enterobacter cloacae complex NOT DETECTED NOT DETECTED Final   Escherichia coli NOT DETECTED NOT DETECTED Final   Klebsiella aerogenes NOT DETECTED NOT DETECTED Final   Klebsiella oxytoca NOT DETECTED NOT DETECTED Final   Klebsiella pneumoniae NOT DETECTED NOT DETECTED Final   Proteus species NOT DETECTED NOT DETECTED Final   Salmonella species NOT DETECTED NOT DETECTED Final   Serratia marcescens NOT DETECTED NOT DETECTED Final   Haemophilus influenzae NOT DETECTED NOT DETECTED Final   Neisseria meningitidis NOT DETECTED NOT DETECTED Final   Pseudomonas aeruginosa NOT DETECTED NOT DETECTED Final   Stenotrophomonas maltophilia NOT DETECTED NOT DETECTED Final   Candida albicans NOT DETECTED NOT DETECTED Final   Candida auris NOT DETECTED NOT DETECTED Final   Candida glabrata NOT DETECTED NOT DETECTED Final   Candida krusei NOT DETECTED NOT DETECTED Final   Candida parapsilosis  NOT DETECTED NOT DETECTED Final   Candida tropicalis NOT DETECTED NOT DETECTED Final   Cryptococcus neoformans/gattii NOT DETECTED NOT DETECTED Final   Vancomycin resistance NOT DETECTED NOT DETECTED Final    Comment: Performed at [facility], [address], [place], [place] [address]  Culture, blood (Routine x 2)     Status: None (Preliminary result)   Collection Time: 12/08/21  7:40 PM   Specimen: Left Antecubital; Blood  Result Value Ref Range Status   Specimen Description   Final    LEFT ANTECUBITAL BLOOD Performed at [facility], [place] [address] [place], [place] [address]    Special Requests   Final    BOTTLES DRAWN AEROBIC AND ANAEROBIC Blood Culture results may not be optimal due to an excessive volume of blood received in culture bottles Performed at [place] [address] [place], [place] [address]    Culture   Final    NO GROWTH 2 DAYS Performed at [facility] [address] [place], [place] [address]    Report Status PENDING  Incomplete  Impression/Plan:  1. Positive blood culture - one bottle with E faecalis and no signs of active infection. TTE to be done today.  Will monitor TTE results.  He has remained afebrile, has no leukocytosis.  Most c/w contaminate and will stop ampicillin.    2.  Right upper lobe opacity - as above, no leukocytosis, no fever, + vascular congestion and elevated BNP, no hypoxia.  Not c/w infection.   3.  Sweats - as above, no signs of infection. Most likely secondary to pulmonary edema.

## 2021-12-11 DIAGNOSIS — R0602 Shortness of breath: Secondary | ICD-10-CM | POA: Diagnosis not present

## 2021-12-11 DIAGNOSIS — R7881 Bacteremia: Secondary | ICD-10-CM | POA: Diagnosis not present

## 2021-12-11 DIAGNOSIS — B952 Enterococcus as the cause of diseases classified elsewhere: Secondary | ICD-10-CM | POA: Diagnosis not present

## 2021-12-11 DIAGNOSIS — Z7401 Bed confinement status: Secondary | ICD-10-CM | POA: Diagnosis not present

## 2021-12-11 DIAGNOSIS — Z743 Need for continuous supervision: Secondary | ICD-10-CM | POA: Diagnosis not present

## 2021-12-11 LAB — CULTURE, BLOOD (ROUTINE X 2)

## 2021-12-11 LAB — GLUCOSE, CAPILLARY: Glucose-Capillary: 170 mg/dL — ABNORMAL HIGH (ref 70–99)

## 2021-12-11 MED ORDER — POTASSIUM CHLORIDE CRYS ER 20 MEQ PO TBCR: 20.0000 meq | EXTENDED_RELEASE_TABLET | Freq: Every day | ORAL | 0 refills | Status: DC

## 2021-12-11 MED ORDER — AMLODIPINE BESYLATE 10 MG PO TABS: 10.0000 mg | ORAL_TABLET | Freq: Every day | ORAL | Status: DC

## 2021-12-11 MED ORDER — AMLODIPINE BESYLATE 10 MG PO TABS
10.0000 mg | ORAL_TABLET | Freq: Every day | ORAL | Status: DC
  Administered 2021-12-11: 10 mg via ORAL
  Filled 2021-12-11: qty 1

## 2021-12-11 NOTE — Progress Notes (Signed)
[facility] and spoke with Tyler Pita, RN to give report. AVS reviewed with Erlene Quan. Patient transporting to [facility] via [place].

## 2021-12-11 NOTE — Progress Notes (Addendum)
[place] to give report on [facility] discharging twice and unable to reach a Therapist, sports.called [phone] did reach secretary but unable to reach anyone once transferred.

## 2021-12-11 NOTE — Discharge Summary (Signed)
Physician Discharge Summary  [name]:[id number] DOB: August 27, 1960 DOA: 12/08/2021  PCP: Jodi Marble, MD  Admit date: 12/08/2021 Discharge date: 12/11/2021  [facility]  Recommendations for Outpatient Follow-up:  Follow up with PCP in 1-2 weeks Increase amlodipine to 10 mg p.o. daily Decrease potassium supplement to once daily Continue to monitor blood pressure, daily weights May benefit from close nephrology follow-up given his advanced renal disease Needs outpatient goals of care discussion given overall poor prognosis with unlikely candidate for hemodialysis  Discharge Condition: Stable, but overall prognosis long-term remains poor CODE STATUS: Full code Diet recommendation: Heart healthy/renal diet  History of present illness:  [name] is a 61 y.o. male with past medical history significant for HFpEF (EF 60-65%, G2DD by TTE 10/21/2021), CKD stage IV, history of CVA with right hemiparesis, IDT2DM, HTN, HLD, PAD s/p left BKA, iron deficiency anemia, depression/anxiety who presented to [facility] ED on 10/14 from [place] [facility] for evaluation of fever and chills. Patient reports new onset of chills and diaphoresis beginning yesterday.  He had an episode of nausea with vomiting but is not sure if he aspirated.  He is not sure if he has had any fevers.  He denies any chest pain, dyspnea, cough, abdominal pain.  He reports good urine output.  He reports chronic right-sided weakness due to prior stroke which is unchanged.   In the ED, BP 179/94, pulse 105, RR 22, temp 99.6 F, SPO2 98% on room air. WBC 9.8, hemoglobin 10.1, platelets 188,000, sodium 139, potassium 5.1, bicarb 20, BUN 39, creatinine 2.53, serum glucose 126, LFTs within normal limits, lactic acid 1.0. SARS-CoV-2 PCR negative.  Blood cultures and Urinalysis pending collection. 2 view chest x-ray showed cardiomegaly with central pulmonary vascular congestion  and patchy airspace opacities in the right upper lobe. Patient was given 500 cc LR and IV cefepime.  The hospitalist service was consulted to admit for further evaluation and management.  Hospital course:  Positive blood culture likely secondary to contaminant Patient presenting with chills, blood cultures 1 out of 4 positive for Enterococcus faecalis.  Initially started on antibiotics with ceftriaxone followed by ampicillin.  Seen by infectious disease.  Only 1 out of 4 blood cultures positive for Enterococcus faecalis, ID believes this is a contaminant and antibiotics were discontinued.  TTE with no vegetations noted on valves.   Right upper lobe pneumonia, ruled out Patient diaphoretic with chills.  CXR suggestive of RUL pneumonia.  COVID is negative.  Slightly elevated pulse and respiratory rate on arrival but not with subsequent readings.  Sepsis not ruled in on admission.  Pneumonia ruled out as his symptoms on presentation more likely related to his Enterococcus septicemia as above.  Was initially started on azithromycin and ceftriaxone, now de-escalated to ampicillin as above.   Chronic heart failure with preserved ejection fraction (HFpEF) (HCC) Some pulmonary vascular congestion on CXR.  EF is 60-65% by TTE 10/21/2021. Currently saturating well on room air.  Continue Lasix 80 mg p.o. twice daily.  Recommend close monitoring of weights.   CKD (chronic kidney disease) stage 4, GFR 15-29 ml/min (HCC) Renal function slightly improved from recent baseline, 3.0 - 3.8.  Creatinine 2.61 at time of discharge.  Continue sodium bicarb and 1300 mg p.o. twice daily.  Consideration of outpatient follow-up with nephrology.  Unlikely dialysis candidate given his comorbidities and will likely need further goals of care discussions as he has overall poor prognosis.  Hypertension Amlodipine increased to 10 mg p.o. daily, continue Lasix 80 mg p.o. twice daily.   Type 2 diabetes mellitus (HCC) On Lantus 15  units immensely daily at baseline.  Hemoglobin A1c 5.8 in 10/21/2021, well controlled.   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  Continue aspirin, statin.   PAD (peripheral artery disease) (HCC) S/p left BKA.  Continue aspirin and statin.   BPH (benign prostatic hyperplasia) Continue Flomax.   Depression with anxiety Continue sertraline.   Left BKA stump pressure injury, POA Pressure Injury 12/08/21 Other (Comment) Left;Posterior Unstageable - Full thickness tissue loss in which the base of the injury is covered by slough (yellow, tan, gray, green or brown) and/or eschar (tan, brown or black) in the wound bed. (Active)  12/08/21 2300  Location: Other (Comment) (Left BKA Stump)  Location Orientation: Left;Posterior  Staging: Unstageable - Full thickness tissue loss in which the base of the injury is covered by slough (yellow, tan, gray, green or brown) and/or eschar (tan, brown or black) in the wound bed.  Wound Description (Comments):   Present on Admission: Yes  --Wound RN consulted, continue local wound care, offloading --Wound care to left lateral stump full thickness wound:  Cleanse with soap and water, rinse and dry. Fill defect with size appropriate piece of calcium alginate dressing, top with dry gauze and secure with silicone foam dressing. Change daily.  Discharge Diagnoses:  Principal Problem:   Bacteremia due to Enterococcus Active Problems:   Right upper lobe pneumonia   Chronic heart failure with preserved ejection fraction (HFpEF) (HCC)   CKD (chronic kidney disease) stage 4, GFR 15-29 ml/min (HCC)   Hypertension   Type 2 diabetes mellitus (HCC)   History of cerebrovascular accident (CVA) with residual deficit   Depression with anxiety   BPH (benign prostatic hyperplasia)   PAD (peripheral artery disease) ([place])    Discharge Instructions  Discharge Instructions     Call MD for:  difficulty  breathing, headache or visual disturbances   Complete by: As directed    Call MD for:  extreme fatigue   Complete by: As directed    Call MD for:  persistant dizziness or light-headedness   Complete by: As directed    Call MD for:  persistant nausea and vomiting   Complete by: As directed    Call MD for:  severe uncontrolled pain   Complete by: As directed    Call MD for:  temperature >100.4   Complete by: As directed    Diet - low sodium heart healthy   Complete by: As directed    Discharge wound care:   Complete by: As directed    Wound care to left lateral stump full thickness wound:  Cleanse with soap and water, rinse and dry. Fill defect with size appropriate piece of calcium alginate dressing, top with dry gauze and secure with silicone foam dressing. Change daily.   Increase activity slowly   Complete by: As directed       Allergies as of 12/11/2021       Reactions   Other Anaphylaxis, Itching, Swelling, Other (See Comments)   Grits - swelling        Medication List     STOP taking these medications    calcium acetate 667 MG capsule Commonly known as: PHOSLO   multivitamin Tabs tablet       TAKE these medications    amLODipine 10 MG tablet Commonly known  as: NORVASC Take 1 tablet (10 mg total) by mouth daily. What changed: how much to take   ascorbic acid 500 MG tablet Commonly known as: VITAMIN C Take 500 mg by mouth 2 (two) times daily.   aspirin 81 MG chewable tablet Chew 81 mg by mouth daily.   atorvastatin 80 MG tablet Commonly known as: LIPITOR Take 80 mg by mouth at bedtime.   brimonidine 0.2 % ophthalmic solution Commonly known as: ALPHAGAN Place 1 drop into both eyes 2 (two) times daily.   docusate sodium 100 MG capsule Commonly known as: COLACE Take 100 mg by mouth at bedtime.   dorzolamide-timolol 2-0.5 % ophthalmic solution Commonly known as: COSOPT Place 1 drop into both eyes 2 (two) times daily.   feeding supplement (NEPRO  CARB STEADY) Liqd Take 237 mLs by mouth at bedtime.   ferrous sulfate 325 (65 FE) MG tablet Take 1 tablet (325 mg total) by mouth daily.   furosemide 80 MG tablet Commonly known as: LASIX Take 1 tablet (80 mg total) by mouth 2 (two) times daily.   insulin glargine 100 UNIT/ML injection Commonly known as: LANTUS Inject 15 Units into the skin daily.   latanoprost 0.005 % ophthalmic solution Commonly known as: XALATAN Place 1 drop into both eyes at bedtime.   Magnesium Oxide 400 MG Caps Take 1 capsule (400 mg total) by mouth daily.   multivitamin tablet Take 1 tablet by mouth 2 (two) times daily.   pantoprazole 40 MG tablet Commonly known as: PROTONIX Take 1 tablet (40 mg total) by mouth daily.   potassium chloride SA 20 MEQ tablet Commonly known as: KLOR-CON M Take 1 tablet (20 mEq total) by mouth daily. What changed:  when to take this Another medication with the same name was removed. Continue taking this medication, and follow the directions you see here.   pregabalin 75 MG capsule Commonly known as: LYRICA Take 1 capsule (75 mg total) by mouth daily.   PROTEIN PO Take 30 mLs by mouth 2 (two) times daily.   saccharomyces boulardii 250 MG capsule Commonly known as: FLORASTOR Take 1 capsule (250 mg total) by mouth 2 (two) times daily.   sertraline 50 MG tablet Commonly known as: ZOLOFT Take 50 mg by mouth every evening.   sodium bicarbonate 650 MG tablet Take 2 tablets (1,300 mg total) by mouth 2 (two) times daily.   tamsulosin 0.4 MG Caps capsule Commonly known as: FLOMAX Take 0.8 mg by mouth every evening.   Vitamin D (Ergocalciferol) 1.25 MG (50000 UNIT) Caps capsule Commonly known as: DRISDOL Take 50,000 Units by mouth every 30 (thirty) days. 17th of each month   zinc sulfate 220 (50 Zn) MG capsule Take 220 mg by mouth daily.               Discharge Care Instructions  (From admission, onward)           Start     Ordered   12/11/21  0000  Discharge wound care:       Comments: Wound care to left lateral stump full thickness wound:  Cleanse with soap and water, rinse and dry. Fill defect with size appropriate piece of calcium alginate dressing, top with dry gauze and secure with silicone foam dressing. Change daily.   12/11/21 0937            Allergies  Allergen Reactions   Other Anaphylaxis, Itching, Swelling and Other (See Comments)    Grits - swelling  Consultations: Infectious disease   Procedures/Studies: ECHOCARDIOGRAM COMPLETE  Result Date: 12/10/2021    ECHOCARDIOGRAM REPORT   Patient Name:   [name] Date of Exam: 12/10/2021 Medical Rec #:  [id number]     Height:       68.0 in Accession #:    [id number]    Weight:       235.2 lb Date of Birth:  08-06-1960      BSA:          2.190 m Patient Age:    47 years      BP:           172/93 mmHg Patient Gender: M             HR:           76 bpm. Exam Location:  Inpatient Procedure: 2D Echo, Cardiac Doppler and Color Doppler Indications:    Bacteremia R78.81  History:        Patient has prior history of Echocardiogram examinations, most                 recent 10/21/2021. CHF, PAD and Stroke; Risk                 Factors:Hypertension, Diabetes and Dyslipidemia. Chronic kidney                 disease. Enterococcus faecalis septicemia SBE.  Sonographer:    Darlina Sicilian RDCS Referring Phys: [id number] [place]  1. Left ventricular ejection fraction, by estimation, is 60 to 65%. The left ventricle has normal function. The left ventricle has no regional wall motion abnormalities. There is mild concentric left ventricular hypertrophy. Left ventricular diastolic parameters are consistent with Grade I diastolic dysfunction (impaired relaxation).  2. Right ventricular systolic function is normal. The right ventricular size is normal. Tricuspid regurgitation signal is inadequate for assessing PA pressure.  3. Left atrial size was mildly dilated.  4. A small to  moderate pericardial effusion is present. The pericardial effusion is circumferential. There is no evidence of cardiac tamponade.  5. The mitral valve is normal in structure. Trivial mitral valve regurgitation.  6. The aortic valve is tricuspid. There is There is moderate calcification of the aortic valve leaflets most notable on the RCC of the aortic valve. There is moderate thickening of the aortic valve. Aortic valve regurgitation is mild. Moderate aortic valve stenosis. Aortic valve mean gradient measures 17.0 mmHg. Aortic valve Vmax measures 2.38 m/s. AVA by continuity is 1.1cm2, DI 0.37.  7. The inferior vena cava is dilated in size with <50% respiratory variability, suggesting right atrial pressure of 15 mmHg. Comparison(s): Prior images reviewed side by side. Compared to prior stuyd on 10/21/21, there is no significant change. There continues to be moderate AS. There is a small pericardial effusion that is stable in size. Conclusion(s)/Recommendation(s): No evidence of valvular vegetations on this transthoracic echocardiogram. Consider a transesophageal echocardiogram to exclude infective endocarditis if clinically indicated. FINDINGS  Left Ventricle: Left ventricular ejection fraction, by estimation, is 60 to 65%. The left ventricle has normal function. The left ventricle has no regional wall motion abnormalities. The left ventricular internal cavity size was normal in size. There is  mild concentric left ventricular hypertrophy. Left ventricular diastolic parameters are consistent with Grade I diastolic dysfunction (impaired relaxation). Right Ventricle: The right ventricular size is normal. Right vetricular wall thickness was not well visualized. Right ventricular systolic function is normal. Tricuspid regurgitation signal is inadequate for assessing  PA pressure. Left Atrium: Left atrial size was mildly dilated. Right Atrium: Right atrial size was normal in size. Pericardium: A small to moderate  pericardial effusion is present. The pericardial effusion is circumferential. There is no evidence of cardiac tamponade. Mitral Valve: The mitral valve is normal in structure. Mild to moderate mitral annular calcification. Trivial mitral valve regurgitation. Tricuspid Valve: The tricuspid valve is normal in structure. Tricuspid valve regurgitation is trivial. There is no evidence of tricuspid valve vegetation. Aortic Valve: AVA by continuity is 1.1cm2, DI 0.37. The aortic valve is tricuspid. There is There is moderate calcification of the aortic valve leaflets most notable on the RCC of the aortic valve. There is moderate thickening of the aortic valve. Aortic  valve regurgitation is mild. Moderate aortic stenosis is present. Aortic valve mean gradient measures 17.0 mmHg. Aortic valve peak gradient measures 22.7 mmHg. Aortic valve area, by VTI measures 0.87 cm. Pulmonic Valve: The pulmonic valve was normal in structure. Pulmonic valve regurgitation is trivial. Aorta: The aortic root is normal in size and structure. Venous: The inferior vena cava is dilated in size with less than 50% respiratory variability, suggesting right atrial pressure of 15 mmHg. IAS/Shunts: The atrial septum is grossly normal.  LEFT VENTRICLE PLAX 2D LVIDd:         4.35 cm   Diastology LVIDs:         3.00 cm   LV e' medial:    2.61 cm/s LV PW:         1.30 cm   LV E/e' medial:  20.2 LV IVS:        1.45 cm   LV e' lateral:   3.30 cm/s LVOT diam:     1.90 cm   LV E/e' lateral: 16.0 LV SV:         43 LV SV Index:   19 LVOT Area:     2.84 cm  RIGHT VENTRICLE RV S prime:     13.20 cm/s TAPSE (M-mode): 1.7 cm LEFT ATRIUM             Index LA Vol (A2C):   46.2 ml 21.10 ml/m LA Vol (A4C):   67.6 ml 30.87 ml/m LA Biplane Vol: 60.1 ml 27.45 ml/m  AORTIC VALVE AV Area (Vmax):    0.93 cm AV Area (Vmean):   0.83 cm AV Area (VTI):     0.87 cm AV Vmax:           238.00 cm/s AV Vmean:          197.000 cm/s AV VTI:            0.491 m AV Peak Grad:       22.7 mmHg AV Mean Grad:      17.0 mmHg LVOT Vmax:         77.70 cm/s LVOT Vmean:        57.900 cm/s LVOT VTI:          0.150 m LVOT/AV VTI ratio: 0.31  AORTA Ao Root diam: 3.00 cm Ao Asc diam:  2.60 cm MITRAL VALVE MV Area (PHT): 2.99 cm    SHUNTS MV Decel Time: 254 msec    Systemic VTI:  0.15 m MV E velocity: 52.70 cm/s  Systemic Diam: 1.90 cm MV A velocity: 61.70 cm/s MV E/A ratio:  0.85 Gwyndolyn Kaufman MD Electronically signed by Gwyndolyn Kaufman MD Signature Date/Time: 12/10/2021/2:03:48 PM    Final    DG Chest 2 View  Result Date: 12/08/2021 CLINICAL  DATA:  Suspected sepsis EXAM: CHEST - 2 VIEW COMPARISON:  Chest x-ray 10/19/2021 FINDINGS: The heart is enlarged. There is central pulmonary vascular congestion. There are patchy airspace opacities in the right upper lobe. No pleural effusion or pneumothorax identified. No acute fractures are seen. IMPRESSION: 1. Cardiomegaly with central pulmonary vascular congestion. 2. Patchy airspace opacities in the right upper lobe which may represent infection or asymmetric edema. Electronically Signed   By: Ronney Asters M.D.   On: 12/08/2021 19:16     Subjective: Patient seen examined bedside, resting comfortably.  Sleeping but easily arousable.  No specific complaints this morning.  Discharging back to SNF.  ID stopped antibiotics yesterday; as thought that blood culture 1 out of 4 positivity was contaminant.  No other questions or concerns at this time.  Denies headache, no fever/chills/night sweats, no nausea/vomiting/diarrhea, no dizziness, no chest pain, no shortness of breath, abdominal pain.  No acute events overnight per nursing staff.  Discharge Exam: Vitals:   12/10/21 1945 12/11/21 0409  BP: (!) 160/91 (!) 177/97  Pulse: 74 71  Resp: 20 20  Temp: 97.8 F (36.6 C) 98 F (36.7 C)  SpO2: 100% 94%   Vitals:   12/10/21 1319 12/10/21 1945 12/11/21 0409 12/11/21 0500  BP: (!) 172/93 (!) 160/91 (!) 177/97   Pulse: 76 74 71   Resp: '18 20 20    '$ Temp: 98.9 F (37.2 C) 97.8 F (36.6 C) 98 F (36.7 C)   TempSrc: Oral Oral Oral   SpO2: 100% 100% 94%   Weight:    106.8 kg  Height:        Physical Exam: GEN: NAD, alert and oriented x 3, chronically ill appearance, appears older than stated age, irritable HEENT: NCAT, PERRL, EOMI, sclera clear, MMM, poor dentition PULM: CTAB w/o wheezes/crackles, normal respiratory effort, on room air CV: RRR w/o M/G/R GI: abd soft, NTND, NABS, no R/G/M MSK: 1+ right lower extremity peripheral edema, noted left BKA with posterior pressure wound, no peripheral edema, diminished muscle strength right upper/lower extremity due to prior CVA with residual weakness  PSYCH: Depressed mood, flat affect, irritable Integumentary:  Left posterior stump wound noted, otherwise no other concerning rashes/lesions/wounds noted on exposed skin surfaces.     The results of significant diagnostics from this hospitalization (including imaging, microbiology, ancillary and laboratory) are listed below for reference.     Microbiology: Recent Results (from the past 240 hour(s))  SARS Coronavirus 2 by RT PCR (hospital order, performed in [facility] hospital lab) *cepheid single result test* Anterior Nasal Swab     Status: None   Collection Time: 12/08/21  7:12 PM   Specimen: Anterior Nasal Swab  Result Value Ref Range Status   SARS Coronavirus 2 by RT PCR NEGATIVE NEGATIVE Final    Comment: (NOTE) SARS-CoV-2 target nucleic acids are NOT DETECTED.  The SARS-CoV-2 RNA is generally detectable in upper and lower respiratory specimens during the acute phase of infection. The lowest concentration of SARS-CoV-2 viral copies this assay can detect is 250 copies / mL. A negative result does not preclude SARS-CoV-2 infection and should not be used as the sole basis for treatment or other patient management decisions.  A negative result may occur with improper specimen collection / handling, submission of specimen  other than nasopharyngeal swab, presence of viral mutation(s) within the areas targeted by this assay, and inadequate number of viral copies (<250 copies / mL). A negative result must be combined with clinical observations, patient history, and  epidemiological information.  Fact Sheet for Patients:   https://www.patel.info/  Fact Sheet for Healthcare Providers: https://hall.com/  This test is not yet approved or  cleared by the [place] FDA and has been authorized for detection and/or diagnosis of SARS-CoV-2 by FDA under an Emergency Use Authorization (EUA).  This EUA will remain in effect (meaning this test can be used) for the duration of the COVID-19 declaration under Section 564(b)(1) of the Act, 21 U.S.C. section 360bbb-3(b)(1), unless the authorization is terminated or revoked sooner.  Performed at [facility], [place] [address] [place], [place] [address]   Culture, blood (Routine x 2)     Status: Abnormal   Collection Time: 12/08/21  7:19 PM   Specimen: BLOOD LEFT FOREARM  Result Value Ref Range Status   Specimen Description   Final    BLOOD LEFT FOREARM Performed at [place] [address] [place], [place] [address]    Special Requests   Final    BOTTLES DRAWN AEROBIC AND ANAEROBIC Blood Culture results may not be optimal due to an excessive volume of blood received in culture bottles Performed at [place] [facility] [place] [address] [place], [place] [address]    Culture  Setup Time   Final    GRAM POSITIVE COCCI IN CHAINS ANAEROBIC BOTTLE ONLY CRITICAL RESULT CALLED TO, READ BACK BY AND VERIFIED WITH: [place].  [id number] FCP Performed at [facility], [address], [place], [place] [address]    Culture ENTEROCOCCUS FAECALIS (A)  Final   Report Status 12/11/2021 FINAL  Final   Organism ID, Bacteria ENTEROCOCCUS FAECALIS  Final      Susceptibility    Enterococcus faecalis - MIC*    AMPICILLIN <=2 SENSITIVE Sensitive     VANCOMYCIN 2 SENSITIVE Sensitive     GENTAMICIN SYNERGY SENSITIVE Sensitive     * ENTEROCOCCUS FAECALIS  Blood Culture ID Panel (Reflexed)     Status: Abnormal   Collection Time: 12/08/21  7:19 PM  Result Value Ref Range Status   Enterococcus faecalis DETECTED (A) NOT DETECTED Final    Comment: CRITICAL RESULT CALLED TO, READ BACK BY AND VERIFIED WITH: PHARMD DREW W.  [id number] FCP    Enterococcus Faecium NOT DETECTED NOT DETECTED Final   Listeria monocytogenes NOT DETECTED NOT DETECTED Final   Staphylococcus species NOT DETECTED NOT DETECTED Final   Staphylococcus aureus (BCID) NOT DETECTED NOT DETECTED Final   Staphylococcus epidermidis NOT DETECTED NOT DETECTED Final   Staphylococcus lugdunensis NOT DETECTED NOT DETECTED Final   Streptococcus species NOT DETECTED NOT DETECTED Final   Streptococcus agalactiae NOT DETECTED NOT DETECTED Final   Streptococcus pneumoniae NOT DETECTED NOT DETECTED Final   Streptococcus pyogenes NOT DETECTED NOT DETECTED Final   A.calcoaceticus-baumannii NOT DETECTED NOT DETECTED Final   Bacteroides fragilis NOT DETECTED NOT DETECTED Final   Enterobacterales NOT DETECTED NOT DETECTED Final   Enterobacter cloacae complex NOT DETECTED NOT DETECTED Final   Escherichia coli NOT DETECTED NOT DETECTED Final   Klebsiella aerogenes NOT DETECTED NOT DETECTED Final   Klebsiella oxytoca NOT DETECTED NOT DETECTED Final   Klebsiella pneumoniae NOT DETECTED NOT DETECTED Final   Proteus species NOT DETECTED NOT DETECTED Final   Salmonella species NOT DETECTED NOT DETECTED Final   Serratia marcescens NOT DETECTED NOT DETECTED Final   Haemophilus influenzae NOT DETECTED NOT DETECTED Final   Neisseria meningitidis NOT DETECTED NOT DETECTED Final   Pseudomonas aeruginosa NOT DETECTED NOT DETECTED  Final   Stenotrophomonas maltophilia NOT DETECTED NOT DETECTED Final   Candida albicans NOT DETECTED  NOT DETECTED Final   Candida auris NOT DETECTED NOT DETECTED Final   Candida glabrata NOT DETECTED NOT DETECTED Final   Candida krusei NOT DETECTED NOT DETECTED Final   Candida parapsilosis NOT DETECTED NOT DETECTED Final   Candida tropicalis NOT DETECTED NOT DETECTED Final   Cryptococcus neoformans/gattii NOT DETECTED NOT DETECTED Final   Vancomycin resistance NOT DETECTED NOT DETECTED Final    Comment: Performed at [facility], [place] [address] [place], [place] [address]  Culture, blood (Routine x 2)     Status: None (Preliminary result)   Collection Time: 12/08/21  7:40 PM   Specimen: Left Antecubital; Blood  Result Value Ref Range Status   Specimen Description   Final    LEFT ANTECUBITAL BLOOD Performed at [facility], [place] [address] [place], [place] [address]    Special Requests   Final    BOTTLES DRAWN AEROBIC AND ANAEROBIC Blood Culture results may not be optimal due to an excessive volume of blood received in culture bottles Performed at [facility] [address], [place], [place] [address]    Culture   Final    NO GROWTH 3 DAYS Performed at [facility], [place] [address], [place], [place] [address]    Report Status PENDING  Incomplete  Culture, blood (Routine X 2) w Reflex to ID Panel     Status: None (Preliminary result)   Collection Time: 12/10/21 10:03 AM   Specimen: BLOOD  Result Value Ref Range Status   Specimen Description   Final    BLOOD SITE NOT SPECIFIED Performed at [facility] [address] [place], [place] [address]    Special Requests   Final    BOTTLES DRAWN AEROBIC AND ANAEROBIC Blood Culture results may not be optimal due to an inadequate volume of blood received in culture bottles Performed at [facility] [address], [place], [place] [address]    Culture   Final    NO GROWTH < 24 HOURS Performed at [facility] [address], [place], [place] [address]     Report Status PENDING  Incomplete  Culture, blood (Routine X 2) w Reflex to ID Panel     Status: None (Preliminary result)   Collection Time: 12/10/21  3:37 PM   Specimen: BLOOD  Result Value Ref Range Status   Specimen Description   Final    BLOOD BLOOD LEFT ARM Performed at [facility] [address] [place], [place] [address]    Special Requests   Final    BOTTLES DRAWN AEROBIC ONLY Blood Culture adequate volume Performed at [facility] [address] [place], [place] [address]    Culture   Final    NO GROWTH < 12 HOURS Performed at [facility] [address], [place], [place] [address]    Report Status PENDING  Incomplete     Labs: BNP (last 3 results) Recent Labs    04/12/21 0551 10/19/21 1232 12/08/21 2130  BNP 276.2* 547.9* 646.8*   Basic Metabolic Panel: Recent Labs  Lab 12/08/21 1919 12/09/21 0526 12/10/21 1004  NA 139 140 137  K 5.1 4.6 3.7  CL 115* 115* 111  CO2 20* 20* 19*  GLUCOSE 126* 133* 136*  BUN 39* 41* 44*  CREATININE 2.53* 2.51* 2.61*  CALCIUM 8.4* 8.3*  8.0*   Liver Function Tests: Recent Labs  Lab 12/08/21 1919  AST 23  ALT 23  ALKPHOS 124  BILITOT 0.6  PROT 7.1  ALBUMIN 2.6*   No results for input(s): "LIPASE", "AMYLASE" in the last 168 hours. No results for input(s): "AMMONIA" in the last 168 hours. CBC: Recent Labs  Lab 12/08/21 1919 12/09/21 0526  WBC 9.8 9.5  NEUTROABS 8.7*  --   HGB 10.1* 9.2*  HCT 33.0* 30.1*  MCV 82.3 83.1  PLT 188 171   Cardiac Enzymes: No results for input(s): "CKTOTAL", "CKMB", "CKMBINDEX", "TROPONINI" in the last 168 hours. BNP: Invalid input(s): "POCBNP" CBG: Recent Labs  Lab 12/09/21 1701 12/09/21 2035 12/10/21 1142 12/10/21 1649 12/10/21 2154  GLUCAP 176* 164* 141* 161* 183*   D-Dimer No results for input(s): "DDIMER" in the last 72 hours. Hgb A1c No results for input(s): "HGBA1C" in the last 72 hours. Lipid Profile No  results for input(s): "CHOL", "HDL", "LDLCALC", "TRIG", "CHOLHDL", "LDLDIRECT" in the last 72 hours. Thyroid function studies No results for input(s): "TSH", "T4TOTAL", "T3FREE", "THYROIDAB" in the last 72 hours.  Invalid input(s): "FREET3" Anemia work up No results for input(s): "VITAMINB12", "FOLATE", "FERRITIN", "TIBC", "IRON", "RETICCTPCT" in the last 72 hours. Urinalysis    Component Value Date/Time   COLORURINE YELLOW 10/19/2021 1840   APPEARANCEUR CLOUDY (A) 10/19/2021 1840   APPEARANCEUR Clear 12/31/2013 2116   LABSPEC 1.012 10/19/2021 1840   LABSPEC 1.032 12/31/2013 2116   PHURINE 5.0 10/19/2021 1840   GLUCOSEU 50 (A) 10/19/2021 1840   GLUCOSEU >=500 12/31/2013 2116   HGBUR NEGATIVE 10/19/2021 1840   BILIRUBINUR NEGATIVE 10/19/2021 1840   BILIRUBINUR Negative 12/31/2013 2116   KETONESUR NEGATIVE 10/19/2021 1840   PROTEINUR 100 (A) 10/19/2021 1840   UROBILINOGEN 1.0 08/28/2013 1709   NITRITE NEGATIVE 10/19/2021 1840   LEUKOCYTESUR NEGATIVE 10/19/2021 1840   LEUKOCYTESUR Negative 12/31/2013 2116   Sepsis Labs Recent Labs  Lab 12/08/21 1919 12/09/21 0526  WBC 9.8 9.5   Microbiology Recent Results (from the past 240 hour(s))  SARS Coronavirus 2 by RT PCR (hospital order, performed in [facility] hospital lab) *cepheid single result test* Anterior Nasal Swab     Status: None   Collection Time: 12/08/21  7:12 PM   Specimen: Anterior Nasal Swab  Result Value Ref Range Status   SARS Coronavirus 2 by RT PCR NEGATIVE NEGATIVE Final    Comment: (NOTE) SARS-CoV-2 target nucleic acids are NOT DETECTED.  The SARS-CoV-2 RNA is generally detectable in upper and lower respiratory specimens during the acute phase of infection. The lowest concentration of SARS-CoV-2 viral copies this assay can detect is 250 copies / mL. A negative result does not preclude SARS-CoV-2 infection and should not be used as the sole basis for treatment or other patient management decisions.  A  negative result may occur with improper specimen collection / handling, submission of specimen other than nasopharyngeal swab, presence of viral mutation(s) within the areas targeted by this assay, and inadequate number of viral copies (<250 copies / mL). A negative result must be combined with clinical observations, patient history, and epidemiological information.  Fact Sheet for Patients:   https://www.patel.info/  Fact Sheet for Healthcare Providers: https://hall.com/  This test is not yet approved or  cleared by the [place] FDA and has been authorized for detection and/or diagnosis of SARS-CoV-2 by FDA under an Emergency Use Authorization (EUA).  This EUA will remain in effect (meaning this test can be used) for the duration of  the COVID-19 declaration under Section 564(b)(1) of the Act, 21 U.S.C. section 360bbb-3(b)(1), unless the authorization is terminated or revoked sooner.  Performed at [facility], [place] [address] [place], [place] [address]   Culture, blood (Routine x 2)     Status: Abnormal   Collection Time: 12/08/21  7:19 PM   Specimen: BLOOD LEFT FOREARM  Result Value Ref Range Status   Specimen Description   Final    BLOOD LEFT FOREARM Performed at [facility] [address], [place], [place] [address]    Special Requests   Final    BOTTLES DRAWN AEROBIC AND ANAEROBIC Blood Culture results may not be optimal due to an excessive volume of blood received in culture bottles Performed at [facility] [address], [place], [place] [address]    Culture  Setup Time   Final    GRAM POSITIVE COCCI IN CHAINS ANAEROBIC BOTTLE ONLY CRITICAL RESULT CALLED TO, READ BACK BY AND VERIFIED WITH: [place].  [id number] FCP Performed at [facility], [address], [place],  [address]    Culture ENTEROCOCCUS FAECALIS (A)  Final   Report Status  12/11/2021 FINAL  Final   Organism ID, Bacteria ENTEROCOCCUS FAECALIS  Final      Susceptibility   Enterococcus faecalis - MIC*    AMPICILLIN <=2 SENSITIVE Sensitive     VANCOMYCIN 2 SENSITIVE Sensitive     GENTAMICIN SYNERGY SENSITIVE Sensitive     * ENTEROCOCCUS FAECALIS  Blood Culture ID Panel (Reflexed)     Status: Abnormal   Collection Time: 12/08/21  7:19 PM  Result Value Ref Range Status   Enterococcus faecalis DETECTED (A) NOT DETECTED Final    Comment: CRITICAL RESULT CALLED TO, READ BACK BY AND VERIFIED WITH: PHARMD DREW W.  [phone] FCP    Enterococcus Faecium NOT DETECTED NOT DETECTED Final   Listeria monocytogenes NOT DETECTED NOT DETECTED Final   Staphylococcus species NOT DETECTED NOT DETECTED Final   Staphylococcus aureus (BCID) NOT DETECTED NOT DETECTED Final   Staphylococcus epidermidis NOT DETECTED NOT DETECTED Final   Staphylococcus lugdunensis NOT DETECTED NOT DETECTED Final   Streptococcus species NOT DETECTED NOT DETECTED Final   Streptococcus agalactiae NOT DETECTED NOT DETECTED Final   Streptococcus pneumoniae NOT DETECTED NOT DETECTED Final   Streptococcus pyogenes NOT DETECTED NOT DETECTED Final   A.calcoaceticus-baumannii NOT DETECTED NOT DETECTED Final   Bacteroides fragilis NOT DETECTED NOT DETECTED Final   Enterobacterales NOT DETECTED NOT DETECTED Final   Enterobacter cloacae complex NOT DETECTED NOT DETECTED Final   Escherichia coli NOT DETECTED NOT DETECTED Final   Klebsiella aerogenes NOT DETECTED NOT DETECTED Final   Klebsiella oxytoca NOT DETECTED NOT DETECTED Final   Klebsiella pneumoniae NOT DETECTED NOT DETECTED Final   Proteus species NOT DETECTED NOT DETECTED Final   Salmonella species NOT DETECTED NOT DETECTED Final   Serratia marcescens NOT DETECTED NOT DETECTED Final   Haemophilus influenzae NOT DETECTED NOT DETECTED Final   Neisseria meningitidis NOT DETECTED NOT DETECTED Final   Pseudomonas aeruginosa NOT DETECTED NOT DETECTED  Final   Stenotrophomonas maltophilia NOT DETECTED NOT DETECTED Final   Candida albicans NOT DETECTED NOT DETECTED Final   Candida auris NOT DETECTED NOT DETECTED Final   Candida glabrata NOT DETECTED NOT DETECTED Final   Candida krusei NOT DETECTED NOT DETECTED Final   Candida parapsilosis NOT DETECTED NOT DETECTED Final   Candida tropicalis NOT DETECTED NOT DETECTED Final  Cryptococcus neoformans/gattii NOT DETECTED NOT DETECTED Final   Vancomycin resistance NOT DETECTED NOT DETECTED Final    Comment: Performed at [facility], [place] [address], [place], [place] [address]  Culture, blood (Routine x 2)     Status: None (Preliminary result)   Collection Time: 12/08/21  7:40 PM   Specimen: Left Antecubital; Blood  Result Value Ref Range Status   Specimen Description   Final    LEFT ANTECUBITAL BLOOD Performed at [facility], [place] [address], [place], [place] [address]    Special Requests   Final    BOTTLES DRAWN AEROBIC AND ANAEROBIC Blood Culture results may not be optimal due to an excessive volume of blood received in culture bottles Performed at [facility] [address] [place], [place] [address]    Culture   Final    NO GROWTH 3 DAYS Performed at [facility], [place] [address], [place], [place] [address]    Report Status PENDING  Incomplete  Culture, blood (Routine X 2) w Reflex to ID Panel     Status: None (Preliminary result)   Collection Time: 12/10/21 10:03 AM   Specimen: BLOOD  Result Value Ref Range Status   Specimen Description   Final    BLOOD SITE NOT SPECIFIED Performed at [facility] [address] [place], [place] [address]    Special Requests   Final    BOTTLES DRAWN AEROBIC AND ANAEROBIC Blood Culture results may not be optimal due to an inadequate volume of blood received in culture bottles Performed at [facility] [address] [place], [place] [address]    Culture   Final     NO GROWTH < 24 HOURS Performed at [name] [address], [place], [place] [address]    Report Status PENDING  Incomplete  Culture, blood (Routine X 2) w Reflex to ID Panel     Status: None (Preliminary result)   Collection Time: 12/10/21  3:37 PM   Specimen: BLOOD  Result Value Ref Range Status   Specimen Description   Final    BLOOD BLOOD LEFT ARM Performed at [facility] [address] [place], [place] [address]    Special Requests   Final    BOTTLES DRAWN AEROBIC ONLY Blood Culture adequate volume Performed at [facility] [address] [place], [place] [address]    Culture   Final    NO GROWTH < 12 HOURS Performed at [facility] [address], [place], [place] [address]    Report Status PENDING  Incomplete     Time coordinating discharge: Over 30 minutes  SIGNED:   Lisle Skillman J [place] DO  [facility] 12/11/2021, 9:37 AM

## 2021-12-11 NOTE — Plan of Care (Signed)

## 2021-12-11 NOTE — TOC Transition Note (Signed)
Transition of Care [facility]) - CM/SW Discharge Note   Patient Details  Name: [name] MRN: [id number] Date of Birth: 05-07-1960  Transition of Care [facility]) CM/SW Contact:  Vassie Moselle, [place] Phone Number: 12/11/2021, 10:14 AM   Clinical Narrative:    Pt is to return to [facility] and will be going to room 224A. RN to call report to [phone].  Pt will be transported to facility via PTAR.    Final next level of care: Assisted Living Barriers to Discharge: Barriers Resolved   Patient Goals and CMS Choice Patient states their goals for this hospitalization and ongoing recovery are:: To return to [facility].gov Compare Post Acute Care list provided to:: Patient Choice offered to / list presented to : Patient  Discharge Placement   Existing PASRR number confirmed : 12/10/21          Patient chooses bed at: Other - please specify in the comment section below: [facility]) Patient to be transferred to facility by: [facility] Name of family member notified: Patient Patient and family notified of of transfer: 12/11/21  Discharge Plan and Services In-house Referral: NA Discharge Planning Services: CM Consult Post Acute Care Choice: Nursing Home          DME Arranged: N/A DME Agency: NA                  Social Determinants of Health ([place]) Interventions     Readmission Risk Interventions    12/10/2021    8:41 AM 01/12/2020    3:39 PM  Readmission Risk Prevention Plan  Transportation Screening Complete Complete  Palliative Care Screening  Not Applicable  Medication Review (RN Care Manager) Complete   PCP or Specialist appointment within 3-5 days of discharge Complete   HRI or Home Care Consult Complete   SW Recovery Care/Counseling Consult Complete   Palliative Care Screening Not [place] Complete

## 2021-12-12 DIAGNOSIS — E11622 Type 2 diabetes mellitus with other skin ulcer: Secondary | ICD-10-CM | POA: Diagnosis not present

## 2021-12-12 DIAGNOSIS — L8990 Pressure ulcer of unspecified site, unspecified stage: Secondary | ICD-10-CM | POA: Diagnosis not present

## 2021-12-12 DIAGNOSIS — L97821 Non-pressure chronic ulcer of other part of left lower leg limited to breakdown of skin: Secondary | ICD-10-CM | POA: Diagnosis not present

## 2021-12-12 DIAGNOSIS — R269 Unspecified abnormalities of gait and mobility: Secondary | ICD-10-CM | POA: Diagnosis not present

## 2021-12-13 DIAGNOSIS — I509 Heart failure, unspecified: Secondary | ICD-10-CM | POA: Diagnosis not present

## 2021-12-13 DIAGNOSIS — E119 Type 2 diabetes mellitus without complications: Secondary | ICD-10-CM | POA: Diagnosis not present

## 2021-12-13 DIAGNOSIS — J189 Pneumonia, unspecified organism: Secondary | ICD-10-CM | POA: Diagnosis not present

## 2021-12-13 DIAGNOSIS — E1140 Type 2 diabetes mellitus with diabetic neuropathy, unspecified: Secondary | ICD-10-CM | POA: Diagnosis not present

## 2021-12-13 LAB — CULTURE, BLOOD (ROUTINE X 2): Culture: NO GROWTH

## 2021-12-14 DIAGNOSIS — I5030 Unspecified diastolic (congestive) heart failure: Secondary | ICD-10-CM | POA: Diagnosis not present

## 2021-12-14 DIAGNOSIS — N183 Chronic kidney disease, stage 3 unspecified: Secondary | ICD-10-CM | POA: Diagnosis not present

## 2021-12-14 DIAGNOSIS — J189 Pneumonia, unspecified organism: Secondary | ICD-10-CM | POA: Diagnosis not present

## 2021-12-15 LAB — CULTURE, BLOOD (ROUTINE X 2)
Culture: NO GROWTH
Culture: NO GROWTH
Special Requests: ADEQUATE

## 2021-12-17 DIAGNOSIS — E1151 Type 2 diabetes mellitus with diabetic peripheral angiopathy without gangrene: Secondary | ICD-10-CM | POA: Diagnosis not present

## 2021-12-17 DIAGNOSIS — Z89511 Acquired absence of right leg below knee: Secondary | ICD-10-CM | POA: Diagnosis not present

## 2021-12-17 DIAGNOSIS — Z794 Long term (current) use of insulin: Secondary | ICD-10-CM | POA: Diagnosis not present

## 2021-12-19 DIAGNOSIS — L8990 Pressure ulcer of unspecified site, unspecified stage: Secondary | ICD-10-CM | POA: Diagnosis not present

## 2021-12-19 DIAGNOSIS — R69 Illness, unspecified: Secondary | ICD-10-CM | POA: Diagnosis not present

## 2021-12-19 DIAGNOSIS — F321 Major depressive disorder, single episode, moderate: Secondary | ICD-10-CM | POA: Diagnosis not present

## 2021-12-19 DIAGNOSIS — L97821 Non-pressure chronic ulcer of other part of left lower leg limited to breakdown of skin: Secondary | ICD-10-CM | POA: Diagnosis not present

## 2021-12-19 DIAGNOSIS — T8789 Other complications of amputation stump: Secondary | ICD-10-CM | POA: Diagnosis not present

## 2021-12-19 DIAGNOSIS — E11622 Type 2 diabetes mellitus with other skin ulcer: Secondary | ICD-10-CM | POA: Diagnosis not present

## 2021-12-21 DIAGNOSIS — Z794 Long term (current) use of insulin: Secondary | ICD-10-CM | POA: Diagnosis not present

## 2021-12-21 DIAGNOSIS — H5461 Unqualified visual loss, right eye, normal vision left eye: Secondary | ICD-10-CM | POA: Diagnosis not present

## 2021-12-21 DIAGNOSIS — E113512 Type 2 diabetes mellitus with proliferative diabetic retinopathy with macular edema, left eye: Secondary | ICD-10-CM | POA: Diagnosis not present

## 2021-12-24 IMAGING — CR DG FOOT COMPLETE 3+V*R*
3 series · 3 of 3 positions shown · non-contrast
Comparison: None.

CLINICAL DATA: Foot injury, laceration of the plantar

EXAM:
RIGHT FOOT COMPLETE - 3+ VIEW

[x foot lat right]
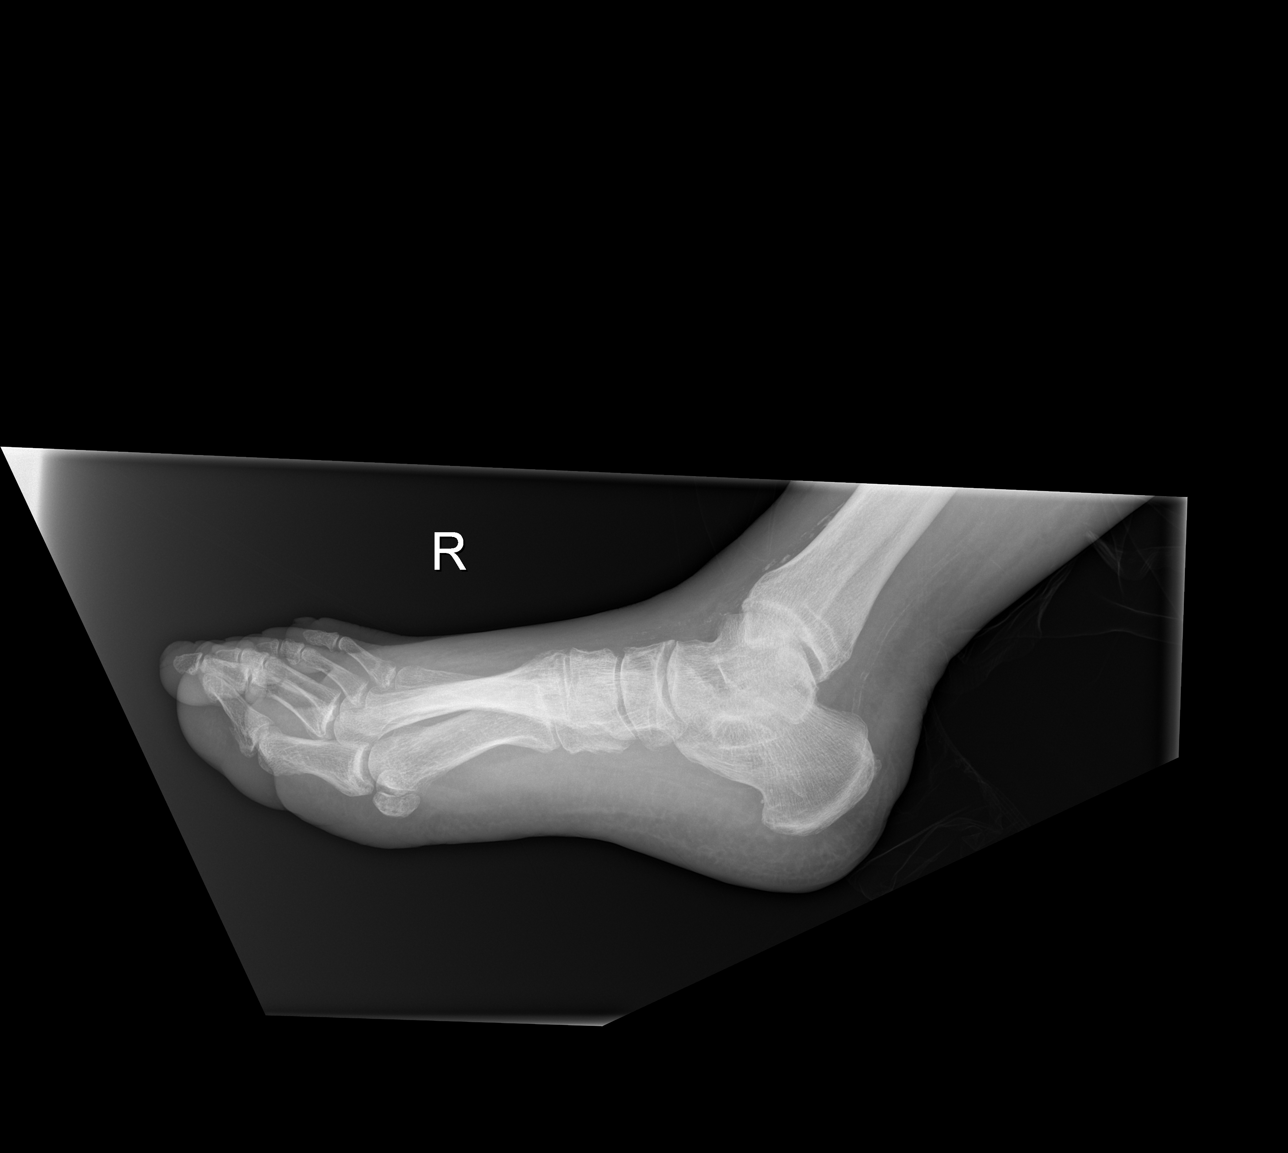

[x foot obl right]
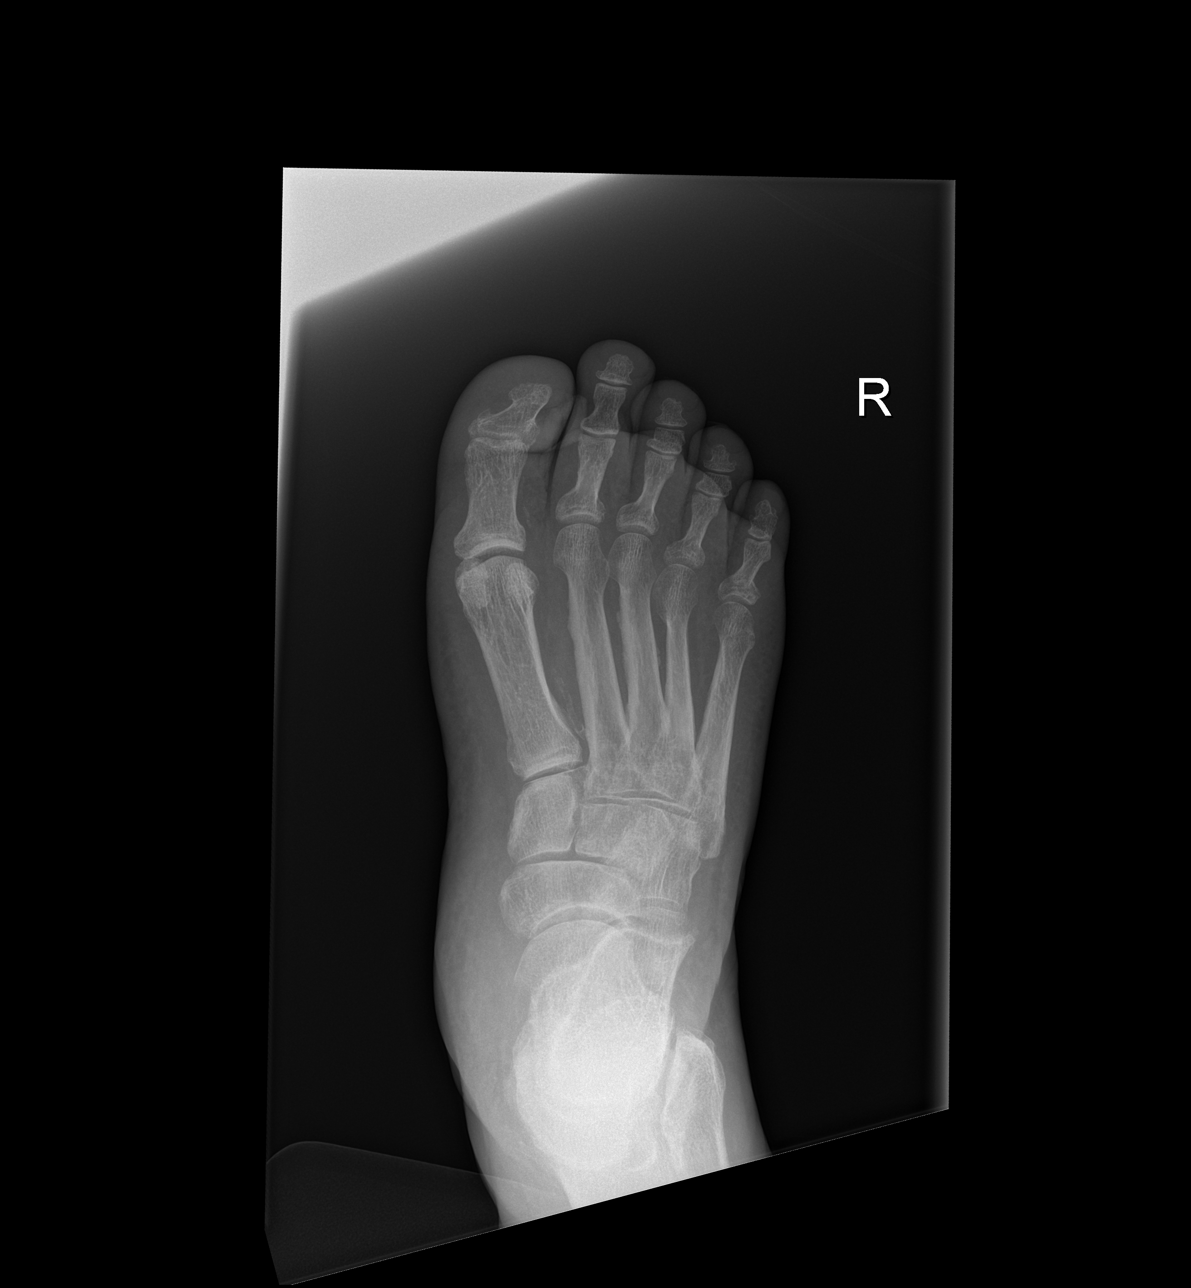

[x foot right 0-3yrs]
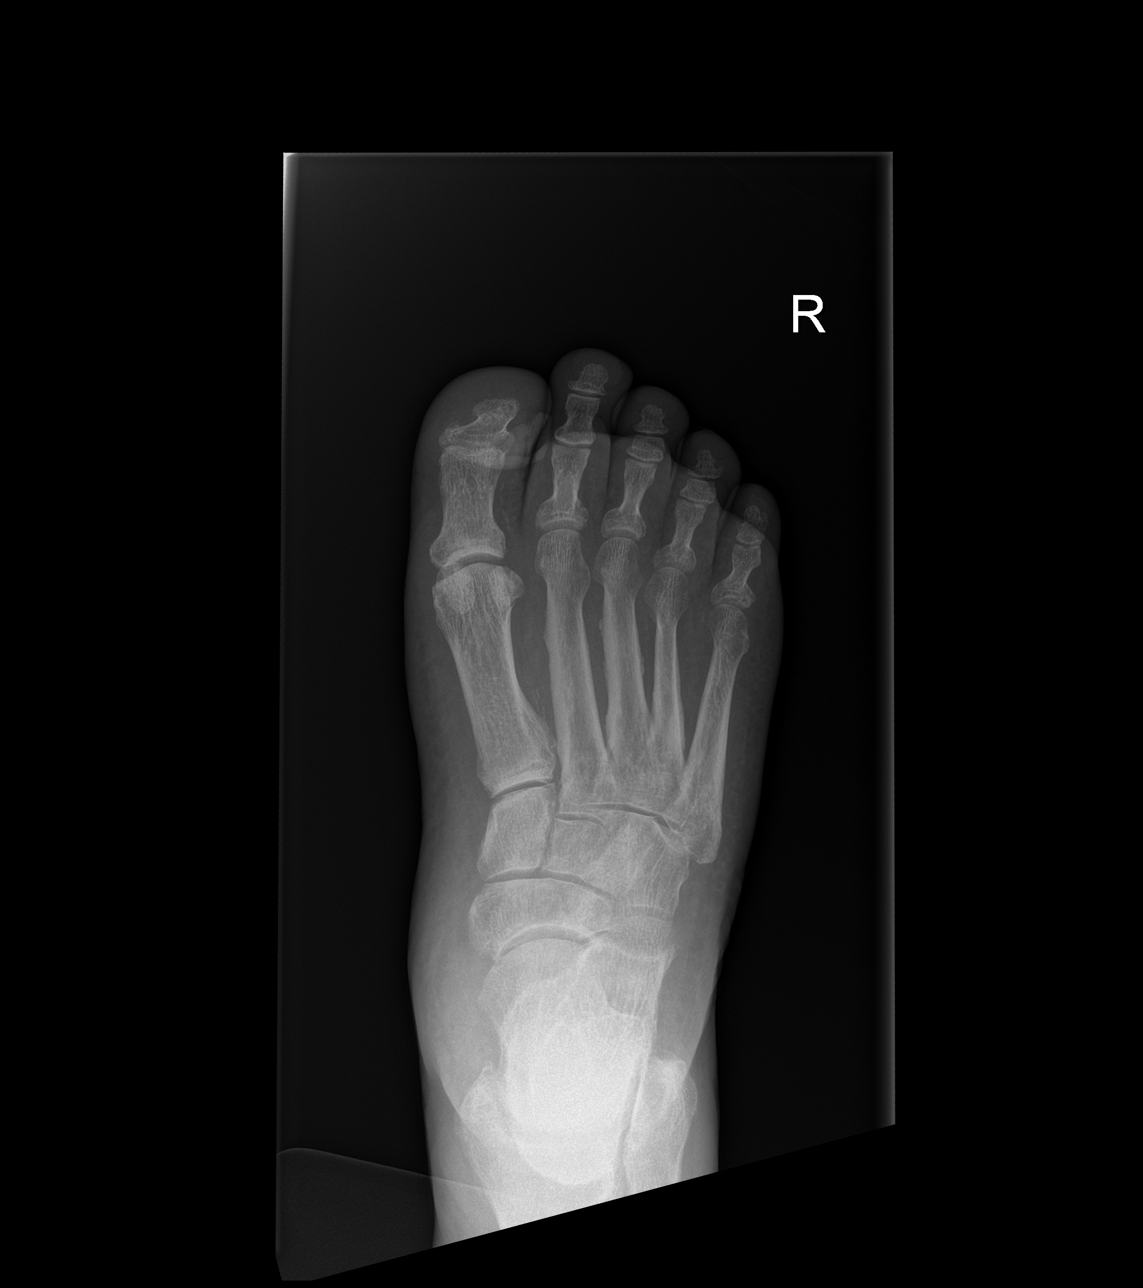

[3 of 3 positions shown; findings below may reference images not displayed]

FINDINGS: Reported site of laceration along the plantar tissues is poorly
visualized on radiography. No soft tissue gas or foreign body is
seen. No associated osseous defect is evident. Subacute appearing
deformity involving the proximal metadiaphysis of the fifth proximal
phalanx. Correlate with point tenderness to exclude more acute
injury. No other discernible fracture or malalignment within
limitations of this nonweightbearing exam. Congenital fusion of the
fifth middle and distal phalanx. No conspicuous osseous lesions.
Degenerative changes throughout the foot and imaged ankle. Mild
edematous changes of the lower extremity with extensive vascular
calcifications.
IMPRESSION: 1. Reported site of laceration along the plantar tissues is poorly
visualized on radiography. No soft tissue gas or foreign body.
2. Subacute appearing fracture deformity involving the proximal
metadiaphysis of the fifth proximal phalanx. Correlate with point
tenderness to exclude more acute injury.
3. Background of diffuse edematous changes of the lower extremity
with extensive vascular calcifications.

## 2021-12-26 DIAGNOSIS — T8789 Other complications of amputation stump: Secondary | ICD-10-CM | POA: Diagnosis not present

## 2021-12-26 DIAGNOSIS — L97821 Non-pressure chronic ulcer of other part of left lower leg limited to breakdown of skin: Secondary | ICD-10-CM | POA: Diagnosis not present

## 2021-12-26 DIAGNOSIS — E11622 Type 2 diabetes mellitus with other skin ulcer: Secondary | ICD-10-CM | POA: Diagnosis not present

## 2021-12-26 DIAGNOSIS — L8990 Pressure ulcer of unspecified site, unspecified stage: Secondary | ICD-10-CM | POA: Diagnosis not present

## 2021-12-27 DIAGNOSIS — D509 Iron deficiency anemia, unspecified: Secondary | ICD-10-CM | POA: Diagnosis not present

## 2021-12-27 DIAGNOSIS — H4313 Vitreous hemorrhage, bilateral: Secondary | ICD-10-CM | POA: Diagnosis not present

## 2021-12-27 DIAGNOSIS — E113522 Type 2 diabetes mellitus with proliferative diabetic retinopathy with traction retinal detachment involving the macula, left eye: Secondary | ICD-10-CM | POA: Diagnosis not present

## 2021-12-27 DIAGNOSIS — I5030 Unspecified diastolic (congestive) heart failure: Secondary | ICD-10-CM | POA: Diagnosis not present

## 2021-12-27 DIAGNOSIS — K219 Gastro-esophageal reflux disease without esophagitis: Secondary | ICD-10-CM | POA: Diagnosis not present

## 2021-12-27 DIAGNOSIS — R69 Illness, unspecified: Secondary | ICD-10-CM | POA: Diagnosis not present

## 2021-12-27 DIAGNOSIS — Z794 Long term (current) use of insulin: Secondary | ICD-10-CM | POA: Diagnosis not present

## 2021-12-27 DIAGNOSIS — I739 Peripheral vascular disease, unspecified: Secondary | ICD-10-CM | POA: Diagnosis not present

## 2021-12-27 DIAGNOSIS — E113592 Type 2 diabetes mellitus with proliferative diabetic retinopathy without macular edema, left eye: Secondary | ICD-10-CM | POA: Diagnosis not present

## 2021-12-27 DIAGNOSIS — I10 Essential (primary) hypertension: Secondary | ICD-10-CM | POA: Diagnosis not present

## 2021-12-27 DIAGNOSIS — Z79899 Other long term (current) drug therapy: Secondary | ICD-10-CM | POA: Diagnosis not present

## 2021-12-27 DIAGNOSIS — E118 Type 2 diabetes mellitus with unspecified complications: Secondary | ICD-10-CM | POA: Diagnosis not present

## 2021-12-27 DIAGNOSIS — H4312 Vitreous hemorrhage, left eye: Secondary | ICD-10-CM | POA: Diagnosis not present

## 2021-12-27 DIAGNOSIS — Z7982 Long term (current) use of aspirin: Secondary | ICD-10-CM | POA: Diagnosis not present

## 2021-12-27 DIAGNOSIS — Z8673 Personal history of transient ischemic attack (TIA), and cerebral infarction without residual deficits: Secondary | ICD-10-CM | POA: Diagnosis not present

## 2021-12-27 DIAGNOSIS — E785 Hyperlipidemia, unspecified: Secondary | ICD-10-CM | POA: Diagnosis not present

## 2021-12-27 DIAGNOSIS — F321 Major depressive disorder, single episode, moderate: Secondary | ICD-10-CM | POA: Diagnosis not present

## 2021-12-27 DIAGNOSIS — H2512 Age-related nuclear cataract, left eye: Secondary | ICD-10-CM | POA: Diagnosis not present

## 2021-12-28 DIAGNOSIS — Z48810 Encounter for surgical aftercare following surgery on the sense organs: Secondary | ICD-10-CM | POA: Diagnosis not present

## 2021-12-28 DIAGNOSIS — H35372 Puckering of macula, left eye: Secondary | ICD-10-CM | POA: Diagnosis not present

## 2021-12-28 DIAGNOSIS — I10 Essential (primary) hypertension: Secondary | ICD-10-CM | POA: Diagnosis not present

## 2021-12-28 DIAGNOSIS — H4312 Vitreous hemorrhage, left eye: Secondary | ICD-10-CM | POA: Diagnosis not present

## 2021-12-28 DIAGNOSIS — H401123 Primary open-angle glaucoma, left eye, severe stage: Secondary | ICD-10-CM | POA: Diagnosis not present

## 2021-12-28 DIAGNOSIS — E1165 Type 2 diabetes mellitus with hyperglycemia: Secondary | ICD-10-CM | POA: Diagnosis not present

## 2021-12-28 DIAGNOSIS — J45991 Cough variant asthma: Secondary | ICD-10-CM | POA: Diagnosis not present

## 2021-12-28 DIAGNOSIS — H2512 Age-related nuclear cataract, left eye: Secondary | ICD-10-CM | POA: Diagnosis not present

## 2021-12-28 DIAGNOSIS — E113513 Type 2 diabetes mellitus with proliferative diabetic retinopathy with macular edema, bilateral: Secondary | ICD-10-CM | POA: Diagnosis not present

## 2021-12-28 DIAGNOSIS — H4051X3 Glaucoma secondary to other eye disorders, right eye, severe stage: Secondary | ICD-10-CM | POA: Diagnosis not present

## 2021-12-28 DIAGNOSIS — E782 Mixed hyperlipidemia: Secondary | ICD-10-CM | POA: Diagnosis not present

## 2021-12-28 DIAGNOSIS — Z794 Long term (current) use of insulin: Secondary | ICD-10-CM | POA: Diagnosis not present

## 2021-12-29 DIAGNOSIS — I10 Essential (primary) hypertension: Secondary | ICD-10-CM | POA: Diagnosis not present

## 2021-12-29 DIAGNOSIS — J45991 Cough variant asthma: Secondary | ICD-10-CM | POA: Diagnosis not present

## 2021-12-29 DIAGNOSIS — E782 Mixed hyperlipidemia: Secondary | ICD-10-CM | POA: Diagnosis not present

## 2021-12-29 DIAGNOSIS — E1165 Type 2 diabetes mellitus with hyperglycemia: Secondary | ICD-10-CM | POA: Diagnosis not present

## 2021-12-31 DIAGNOSIS — F321 Major depressive disorder, single episode, moderate: Secondary | ICD-10-CM | POA: Diagnosis not present

## 2021-12-31 DIAGNOSIS — R69 Illness, unspecified: Secondary | ICD-10-CM | POA: Diagnosis not present

## 2022-01-04 DIAGNOSIS — E1136 Type 2 diabetes mellitus with diabetic cataract: Secondary | ICD-10-CM | POA: Diagnosis not present

## 2022-01-04 DIAGNOSIS — H4312 Vitreous hemorrhage, left eye: Secondary | ICD-10-CM | POA: Diagnosis not present

## 2022-01-04 DIAGNOSIS — H2512 Age-related nuclear cataract, left eye: Secondary | ICD-10-CM | POA: Diagnosis not present

## 2022-01-04 DIAGNOSIS — H35372 Puckering of macula, left eye: Secondary | ICD-10-CM | POA: Diagnosis not present

## 2022-01-04 DIAGNOSIS — H401123 Primary open-angle glaucoma, left eye, severe stage: Secondary | ICD-10-CM | POA: Diagnosis not present

## 2022-01-04 DIAGNOSIS — E113513 Type 2 diabetes mellitus with proliferative diabetic retinopathy with macular edema, bilateral: Secondary | ICD-10-CM | POA: Diagnosis not present

## 2022-01-04 DIAGNOSIS — Z794 Long term (current) use of insulin: Secondary | ICD-10-CM | POA: Diagnosis not present

## 2022-01-04 DIAGNOSIS — H4051X3 Glaucoma secondary to other eye disorders, right eye, severe stage: Secondary | ICD-10-CM | POA: Diagnosis not present

## 2022-01-04 DIAGNOSIS — Z48810 Encounter for surgical aftercare following surgery on the sense organs: Secondary | ICD-10-CM | POA: Diagnosis not present

## 2022-01-08 DIAGNOSIS — E118 Type 2 diabetes mellitus with unspecified complications: Secondary | ICD-10-CM | POA: Diagnosis not present

## 2022-01-08 DIAGNOSIS — M6281 Muscle weakness (generalized): Secondary | ICD-10-CM | POA: Diagnosis not present

## 2022-01-08 DIAGNOSIS — M2550 Pain in unspecified joint: Secondary | ICD-10-CM | POA: Diagnosis not present

## 2022-01-08 DIAGNOSIS — N1831 Chronic kidney disease, stage 3a: Secondary | ICD-10-CM | POA: Diagnosis not present

## 2022-01-09 DIAGNOSIS — M2550 Pain in unspecified joint: Secondary | ICD-10-CM | POA: Diagnosis not present

## 2022-01-09 DIAGNOSIS — L97821 Non-pressure chronic ulcer of other part of left lower leg limited to breakdown of skin: Secondary | ICD-10-CM | POA: Diagnosis not present

## 2022-01-09 DIAGNOSIS — E11622 Type 2 diabetes mellitus with other skin ulcer: Secondary | ICD-10-CM | POA: Diagnosis not present

## 2022-01-09 DIAGNOSIS — R69 Illness, unspecified: Secondary | ICD-10-CM | POA: Diagnosis not present

## 2022-01-09 DIAGNOSIS — E118 Type 2 diabetes mellitus with unspecified complications: Secondary | ICD-10-CM | POA: Diagnosis not present

## 2022-01-09 DIAGNOSIS — N1831 Chronic kidney disease, stage 3a: Secondary | ICD-10-CM | POA: Diagnosis not present

## 2022-01-09 DIAGNOSIS — T8789 Other complications of amputation stump: Secondary | ICD-10-CM | POA: Diagnosis not present

## 2022-01-09 DIAGNOSIS — M6281 Muscle weakness (generalized): Secondary | ICD-10-CM | POA: Diagnosis not present

## 2022-01-09 DIAGNOSIS — L8990 Pressure ulcer of unspecified site, unspecified stage: Secondary | ICD-10-CM | POA: Diagnosis not present

## 2022-01-09 DIAGNOSIS — F321 Major depressive disorder, single episode, moderate: Secondary | ICD-10-CM | POA: Diagnosis not present

## 2022-01-10 DIAGNOSIS — M2550 Pain in unspecified joint: Secondary | ICD-10-CM | POA: Diagnosis not present

## 2022-01-10 DIAGNOSIS — E118 Type 2 diabetes mellitus with unspecified complications: Secondary | ICD-10-CM | POA: Diagnosis not present

## 2022-01-10 DIAGNOSIS — M6281 Muscle weakness (generalized): Secondary | ICD-10-CM | POA: Diagnosis not present

## 2022-01-10 DIAGNOSIS — N1831 Chronic kidney disease, stage 3a: Secondary | ICD-10-CM | POA: Diagnosis not present

## 2022-01-11 DIAGNOSIS — N1831 Chronic kidney disease, stage 3a: Secondary | ICD-10-CM | POA: Diagnosis not present

## 2022-01-11 DIAGNOSIS — E118 Type 2 diabetes mellitus with unspecified complications: Secondary | ICD-10-CM | POA: Diagnosis not present

## 2022-01-11 DIAGNOSIS — M2550 Pain in unspecified joint: Secondary | ICD-10-CM | POA: Diagnosis not present

## 2022-01-11 DIAGNOSIS — M6281 Muscle weakness (generalized): Secondary | ICD-10-CM | POA: Diagnosis not present

## 2022-01-14 DIAGNOSIS — E118 Type 2 diabetes mellitus with unspecified complications: Secondary | ICD-10-CM | POA: Diagnosis not present

## 2022-01-14 DIAGNOSIS — I5030 Unspecified diastolic (congestive) heart failure: Secondary | ICD-10-CM | POA: Diagnosis not present

## 2022-01-14 DIAGNOSIS — F321 Major depressive disorder, single episode, moderate: Secondary | ICD-10-CM | POA: Diagnosis not present

## 2022-01-14 DIAGNOSIS — R69 Illness, unspecified: Secondary | ICD-10-CM | POA: Diagnosis not present

## 2022-01-14 DIAGNOSIS — I10 Essential (primary) hypertension: Secondary | ICD-10-CM | POA: Diagnosis not present

## 2022-01-14 DIAGNOSIS — M6281 Muscle weakness (generalized): Secondary | ICD-10-CM | POA: Diagnosis not present

## 2022-01-14 DIAGNOSIS — E119 Type 2 diabetes mellitus without complications: Secondary | ICD-10-CM | POA: Diagnosis not present

## 2022-01-14 DIAGNOSIS — M2550 Pain in unspecified joint: Secondary | ICD-10-CM | POA: Diagnosis not present

## 2022-01-14 DIAGNOSIS — E1140 Type 2 diabetes mellitus with diabetic neuropathy, unspecified: Secondary | ICD-10-CM | POA: Diagnosis not present

## 2022-01-14 DIAGNOSIS — N1831 Chronic kidney disease, stage 3a: Secondary | ICD-10-CM | POA: Diagnosis not present

## 2022-01-15 DIAGNOSIS — M6281 Muscle weakness (generalized): Secondary | ICD-10-CM | POA: Diagnosis not present

## 2022-01-15 DIAGNOSIS — E11622 Type 2 diabetes mellitus with other skin ulcer: Secondary | ICD-10-CM | POA: Diagnosis not present

## 2022-01-15 DIAGNOSIS — N1831 Chronic kidney disease, stage 3a: Secondary | ICD-10-CM | POA: Diagnosis not present

## 2022-01-15 DIAGNOSIS — M2550 Pain in unspecified joint: Secondary | ICD-10-CM | POA: Diagnosis not present

## 2022-01-15 DIAGNOSIS — T8789 Other complications of amputation stump: Secondary | ICD-10-CM | POA: Diagnosis not present

## 2022-01-15 DIAGNOSIS — E118 Type 2 diabetes mellitus with unspecified complications: Secondary | ICD-10-CM | POA: Diagnosis not present

## 2022-01-15 DIAGNOSIS — L8990 Pressure ulcer of unspecified site, unspecified stage: Secondary | ICD-10-CM | POA: Diagnosis not present

## 2022-01-15 DIAGNOSIS — L97821 Non-pressure chronic ulcer of other part of left lower leg limited to breakdown of skin: Secondary | ICD-10-CM | POA: Diagnosis not present

## 2022-01-16 DIAGNOSIS — N183 Chronic kidney disease, stage 3 unspecified: Secondary | ICD-10-CM | POA: Diagnosis not present

## 2022-01-16 DIAGNOSIS — I5030 Unspecified diastolic (congestive) heart failure: Secondary | ICD-10-CM | POA: Diagnosis not present

## 2022-01-16 DIAGNOSIS — M2550 Pain in unspecified joint: Secondary | ICD-10-CM | POA: Diagnosis not present

## 2022-01-16 DIAGNOSIS — I639 Cerebral infarction, unspecified: Secondary | ICD-10-CM | POA: Diagnosis not present

## 2022-01-16 DIAGNOSIS — J189 Pneumonia, unspecified organism: Secondary | ICD-10-CM | POA: Diagnosis not present

## 2022-01-16 DIAGNOSIS — M6281 Muscle weakness (generalized): Secondary | ICD-10-CM | POA: Diagnosis not present

## 2022-01-16 DIAGNOSIS — N1831 Chronic kidney disease, stage 3a: Secondary | ICD-10-CM | POA: Diagnosis not present

## 2022-01-16 DIAGNOSIS — E118 Type 2 diabetes mellitus with unspecified complications: Secondary | ICD-10-CM | POA: Diagnosis not present

## 2022-01-17 DIAGNOSIS — M2550 Pain in unspecified joint: Secondary | ICD-10-CM | POA: Diagnosis not present

## 2022-01-17 DIAGNOSIS — M6281 Muscle weakness (generalized): Secondary | ICD-10-CM | POA: Diagnosis not present

## 2022-01-17 DIAGNOSIS — E118 Type 2 diabetes mellitus with unspecified complications: Secondary | ICD-10-CM | POA: Diagnosis not present

## 2022-01-17 DIAGNOSIS — N1831 Chronic kidney disease, stage 3a: Secondary | ICD-10-CM | POA: Diagnosis not present

## 2022-01-18 DIAGNOSIS — M2550 Pain in unspecified joint: Secondary | ICD-10-CM | POA: Diagnosis not present

## 2022-01-18 DIAGNOSIS — M6281 Muscle weakness (generalized): Secondary | ICD-10-CM | POA: Diagnosis not present

## 2022-01-18 DIAGNOSIS — N1831 Chronic kidney disease, stage 3a: Secondary | ICD-10-CM | POA: Diagnosis not present

## 2022-01-18 DIAGNOSIS — E118 Type 2 diabetes mellitus with unspecified complications: Secondary | ICD-10-CM | POA: Diagnosis not present

## 2022-01-21 DIAGNOSIS — M6281 Muscle weakness (generalized): Secondary | ICD-10-CM | POA: Diagnosis not present

## 2022-01-21 DIAGNOSIS — N1831 Chronic kidney disease, stage 3a: Secondary | ICD-10-CM | POA: Diagnosis not present

## 2022-01-21 DIAGNOSIS — E118 Type 2 diabetes mellitus with unspecified complications: Secondary | ICD-10-CM | POA: Diagnosis not present

## 2022-01-21 DIAGNOSIS — M2550 Pain in unspecified joint: Secondary | ICD-10-CM | POA: Diagnosis not present

## 2022-01-22 DIAGNOSIS — L97821 Non-pressure chronic ulcer of other part of left lower leg limited to breakdown of skin: Secondary | ICD-10-CM | POA: Diagnosis not present

## 2022-01-22 DIAGNOSIS — L89313 Pressure ulcer of right buttock, stage 3: Secondary | ICD-10-CM | POA: Diagnosis not present

## 2022-01-22 DIAGNOSIS — N1831 Chronic kidney disease, stage 3a: Secondary | ICD-10-CM | POA: Diagnosis not present

## 2022-01-22 DIAGNOSIS — M6281 Muscle weakness (generalized): Secondary | ICD-10-CM | POA: Diagnosis not present

## 2022-01-22 DIAGNOSIS — L89893 Pressure ulcer of other site, stage 3: Secondary | ICD-10-CM | POA: Diagnosis not present

## 2022-01-22 DIAGNOSIS — E118 Type 2 diabetes mellitus with unspecified complications: Secondary | ICD-10-CM | POA: Diagnosis not present

## 2022-01-22 DIAGNOSIS — M2550 Pain in unspecified joint: Secondary | ICD-10-CM | POA: Diagnosis not present

## 2022-01-23 DIAGNOSIS — E118 Type 2 diabetes mellitus with unspecified complications: Secondary | ICD-10-CM | POA: Diagnosis not present

## 2022-01-23 DIAGNOSIS — E119 Type 2 diabetes mellitus without complications: Secondary | ICD-10-CM | POA: Diagnosis not present

## 2022-01-23 DIAGNOSIS — E11622 Type 2 diabetes mellitus with other skin ulcer: Secondary | ICD-10-CM | POA: Diagnosis not present

## 2022-01-23 DIAGNOSIS — I10 Essential (primary) hypertension: Secondary | ICD-10-CM | POA: Diagnosis not present

## 2022-01-23 DIAGNOSIS — L97821 Non-pressure chronic ulcer of other part of left lower leg limited to breakdown of skin: Secondary | ICD-10-CM | POA: Diagnosis not present

## 2022-01-23 DIAGNOSIS — I509 Heart failure, unspecified: Secondary | ICD-10-CM | POA: Diagnosis not present

## 2022-01-23 DIAGNOSIS — N1831 Chronic kidney disease, stage 3a: Secondary | ICD-10-CM | POA: Diagnosis not present

## 2022-01-23 DIAGNOSIS — L8990 Pressure ulcer of unspecified site, unspecified stage: Secondary | ICD-10-CM | POA: Diagnosis not present

## 2022-01-23 DIAGNOSIS — M2550 Pain in unspecified joint: Secondary | ICD-10-CM | POA: Diagnosis not present

## 2022-01-23 DIAGNOSIS — R269 Unspecified abnormalities of gait and mobility: Secondary | ICD-10-CM | POA: Diagnosis not present

## 2022-01-23 DIAGNOSIS — M6281 Muscle weakness (generalized): Secondary | ICD-10-CM | POA: Diagnosis not present

## 2022-01-24 DIAGNOSIS — N1831 Chronic kidney disease, stage 3a: Secondary | ICD-10-CM | POA: Diagnosis not present

## 2022-01-24 DIAGNOSIS — M6281 Muscle weakness (generalized): Secondary | ICD-10-CM | POA: Diagnosis not present

## 2022-01-24 DIAGNOSIS — J189 Pneumonia, unspecified organism: Secondary | ICD-10-CM | POA: Diagnosis not present

## 2022-01-24 DIAGNOSIS — I639 Cerebral infarction, unspecified: Secondary | ICD-10-CM | POA: Diagnosis not present

## 2022-01-24 DIAGNOSIS — E118 Type 2 diabetes mellitus with unspecified complications: Secondary | ICD-10-CM | POA: Diagnosis not present

## 2022-01-24 DIAGNOSIS — N183 Chronic kidney disease, stage 3 unspecified: Secondary | ICD-10-CM | POA: Diagnosis not present

## 2022-01-24 DIAGNOSIS — I5030 Unspecified diastolic (congestive) heart failure: Secondary | ICD-10-CM | POA: Diagnosis not present

## 2022-01-24 DIAGNOSIS — M2550 Pain in unspecified joint: Secondary | ICD-10-CM | POA: Diagnosis not present

## 2022-02-21 ENCOUNTER — Non-Acute Institutional Stay: Payer: Medicare HMO | Admitting: Family Medicine

## 2022-02-21 ENCOUNTER — Encounter: Payer: Self-pay | Admitting: Family Medicine

## 2022-02-21 VITALS — BP 148/108 | HR 76 | Temp 98.4°F | Resp 18

## 2022-02-21 DIAGNOSIS — Z91199 Patient's noncompliance with other medical treatment and regimen due to unspecified reason: Secondary | ICD-10-CM

## 2022-02-21 DIAGNOSIS — I10 Essential (primary) hypertension: Secondary | ICD-10-CM

## 2022-02-21 NOTE — Progress Notes (Signed)
[facility] Consult Note Telephone: [phone]  Fax: [phone]    Date of encounter: 02/21/22 4:38 PM PATIENT NAME: [name]   [phone] (home)  DOB: 23-Feb-1961 MRN: [id number] PRIMARY CARE PROVIDER:    Jodi Marble, MD,  [place] [address] [phone]  REFERRING PROVIDER:   Jodi Marble, MD [address] [place],  [place] [address] [phone]  RESPONSIBLE PARTY:    Contact Information     Name Relation Home Work Mobile   Mickael, Mcnutt   [phone]        I met face to face with patient in [facility] facility. Palliative Care was asked to follow this patient by consultation request of  Jodi Marble, MD to address advance care planning and complex medical decision making. This is a follow up visit   ASSESSMENT , SYMPTOM MANAGEMENT AND PLAN / RECOMMENDATIONS:  Hypertension Poorly controlled today but staff indicated pt refused.  Not a candidate for ACE-I/ARB due to CKD. Agree with Amlodipine due to [facility] Recommend changing alpha blocker-has 2 with Clonidine and Apresoline.  Either would ordinarily be a good choice with CKD but wonder if pt would be more compliant if started on Catapres patch.  Hx of non-compliance with medication regimen Verbalizes taking too many meds, reports hallucinations with Zoloft, denies BPH/GERD. Recommend psychiatric evaluation to see if pt would benefit from medication specific for Bipolar disease or addition of mood stabilizer. Recommend simplification of medication regimen as much as possible to attempt improved compliance.  Advance Care Planning/Goals of Care:  CODE STATUS: Prior code status has been full code Address with pt at next follow up     Follow up Palliative Care Visit: Palliative care will continue to follow for complex medical decision making,  advance care planning, and clarification of goals. Return 4-6 weeks or prn.    This visit was coded based on medical decision making (MDM).  PPS: 50%  HOSPICE ELIGIBILITY/DIAGNOSIS: TBD  Chief Complaint:  Palliative Care is continuing to follow patient for chronic medical management in setting of chronic congestive heart failure and to assist with advanced care planning, refining and defining goals of care.   HISTORY OF PRESENT ILLNESS:  [name] is a 61 y.o. year old male with chronic CHF with preserved EF, hx of CVA with right sided weakness, BKA of left leg, IDDM and peripheral neuropathy, hx of ileus and Ogilvie syndrome, Bipolar Disorder with psychotic episodes, hx of medical non-compliance and cocaine use, PAD, CKD, glaucoma and pulmonary arterial hypertension.  Pt denies indigestion or GERD, thinks he takes too many meds.  States Zoloft causes him to hallucinate and that he does not need Tamsulosin for prostate.  He also denies a need for Colace. States he has tremors in the right side of his neck that come and go for about the last month.  He hasn't noted any association with time of day or activity, nothing that makes it better or worse. He states he doesn't like being awakened at night because he has a hard time getting to sleep and doesn't like it when staff touches him.  Once awake at 3 or 4 he cannot go back to sleep and has no interest in watching tv. He says he has already taken his medicine for his BP today.  Facility staff indicates that pt has refused his  meds today.                                                   History obtained from review of EMR, discussion with facility staff and/or [name].   Hgb A1c 10/21/21: Component Ref Range & Units 4 mo ago (10/21/21) 4 mo ago (10/19/21) 12 mo ago (03/01/21) 1 yr ago (06/25/20) 1 yr ago (03/30/20) 2 yr ago (12/29/19) 4 yr ago (01/01/18)  Hgb A1c MFr Bld 4.8 - 5.6 % 5.8 High  6.2 High  CM 8.4 High  CM 9.1 High  CM 8.2 Abnormal  R  10.6 Abnormal  R 10.3 High        Latest Ref Rng & Units 12/09/2021    5:26 AM 12/08/2021    7:19 PM 10/31/2021   12:49 PM  CBC  WBC 4.0 - 10.5 K/uL 9.5  9.8  5.3   Hemoglobin 13.0 - 17.0 g/dL 9.2  10.1  7.8   Hematocrit 39.0 - 52.0 % 30.1  33.0  25.5   Platelets 150 - 400 K/uL 171  188  246        Latest Ref Rng & Units 12/10/2021   10:04 AM 12/09/2021    5:26 AM 12/08/2021    7:19 PM  CMP  Glucose 70 - 99 mg/dL 136  133  126   BUN 6 - 20 mg/dL 44  41  39   Creatinine 0.61 - 1.24 mg/dL 2.61  2.51  2.53   Sodium 135 - 145 mmol/L 137  140  139   Potassium 3.5 - 5.1 mmol/L 3.7  4.6  5.1   Chloride 98 - 111 mmol/L 111  115  115   CO2 22 - 32 mmol/L _0 Calcium 8.9 - 10.3 mg/dL 8.0  8.3  8.4   Total Protein 6.5 - 8.1 g/dL   7.1   Total Bilirubin 0.3 - 1.2 mg/dL   0.6   Alkaline Phos 38 - 126 U/L   124   AST 15 - 41 U/L   23   ALT 0 - 44 U/L   23        Latest Ref Rng & Units 12/08/2021    7:19 PM 10/31/2021   12:49 PM 10/30/2021    2:20 AM  Hepatic Function  Total Protein 6.5 - 8.1 g/dL 7.1     Albumin 3.5 - 5.0 g/dL 2.6  2.0  2.0   AST 15 - 41 U/L 23     ALT 0 - 44 U/L 23     Alk Phosphatase 38 - 126 U/L 124     Total Bilirubin 0.3 - 1.2 mg/dL 0.6      12/08/21 CXR: IMPRESSION: 1. Cardiomegaly with central pulmonary vascular congestion. 2. Patchy airspace opacities in the right upper lobe which may represent infection or asymmetric edema.  2d echo 10/21/21: LV EF 60-65%.  LV normal function and no wall motion abnormalities. Grade II diastolic dysfunction.  RV size and systolic function are normal.  Moderately elevated pulmonary artery systolic pressure. Estimated RVSP 46.8 mm Hg.  Small, circumferential pericardial effusion noted without tamponade. Mild MR, moderate aortic valve stenosis.  I reviewed EMR for available labs, medications, imaging, studies and related documents.  Records reviewed and summarized above.   ROS General: NAD Cardiovascular: denies  chest pain, denies DOE Pulmonary: denies cough, denies SOB Abdomen: endorses fair appetite, denies constipation GU: denies dysuria MSK:  denies increased weakness, no falls reported, left leg BKA Skin: denies rashes or wounds Neurological: denies pain, endorses difficulty with insomnia particularly after being woken up during the night. Endorses "tremors" of right side of his neck intermittently Psych: does not like being touched Heme/lymph/immuno: denies bruises, abnormal bleeding  Physical Exam: Current and past weights: 02/18/22 230 lbs, 235 lbs 7.2 oz on 12/11/21 Constitutional: NAD, appears unkempt General: WN disheveled EYES: anicteric sclera, lids intact, no discharge  ENMT: intact hearing, oral mucous membranes moist, missing dentition CV: E7M0, RRR with systolic ejection click and mild early systolic murmur, RLE 2+ edema Pulmonary: CTAB, no increased work of breathing, no cough, room air Abdomen: hypo-active BS + 4 quadrants, soft and non tender, no ascites GU: deferred MSK: no sarcopenia, moves all extremities, seated in WC, no visible tremors noted Skin: warm and dry, no rashes or wounds on visible skin Neuro:  no generalized weakness,  no cognitive impairment Psych: non-anxious affect, A and O x 3 Hem/lymph/immuno: no widespread bruising   Thank you for the opportunity to participate in the care of [name].  The palliative care team will continue to follow. Please call our office at [phone] if we can be of additional assistance.   Marijo Conception, [name] -C  COVID-19 PATIENT SCREENING TOOL Asked and negative response unless otherwise noted:   Have you had symptoms of covid, tested positive or been in contact with someone with symptoms/positive test in the past 5-10 days?  Unknown, Covid outbreak at facility 01/29/22

## 2022-02-25 ENCOUNTER — Encounter: Payer: Self-pay | Admitting: Family Medicine

## 2022-02-25 DIAGNOSIS — Z89512 Acquired absence of left leg below knee: Secondary | ICD-10-CM | POA: Insufficient documentation

## 2022-03-05 IMAGING — CT CT HEAD W/O CM
4 series · 16 of 47 positions shown, 18 images · non-contrast
Comparison: Head CT 09/28/2017.

CLINICAL DATA: 59-year-old male with headache and chest pain.

EXAM:
CT HEAD WITHOUT CONTRAST
TECHNIQUE: Contiguous axial images were obtained from the base of the skull
through the vertex without intravenous contrast.

[Series 2: head wo · axial · 0.47mm/px · z∈[-116,-12]mm · 7 of 29 slices shown, 9 images]
[im 4/29  brain]
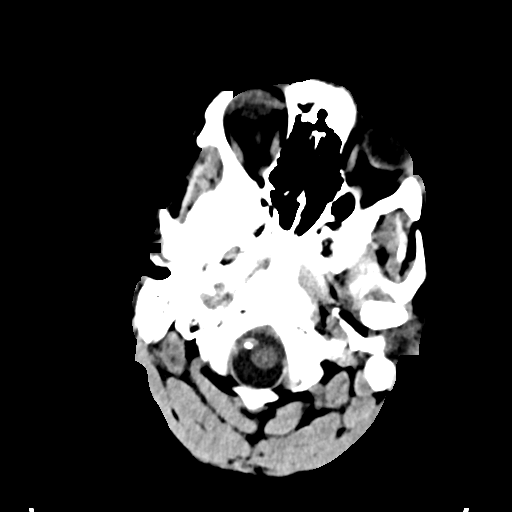
[im 4/29  bone]
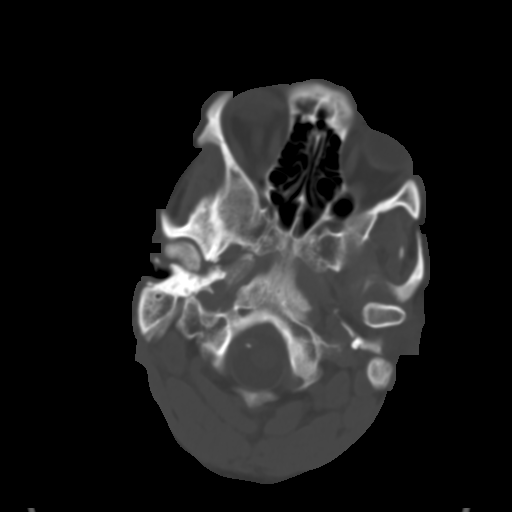
[im 8/29  brain]
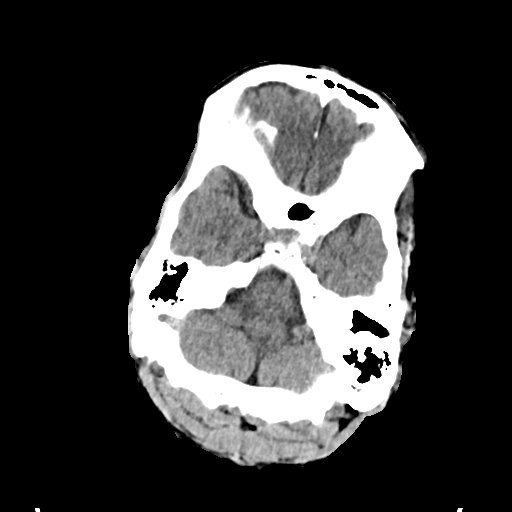
[im 11/29  brain]
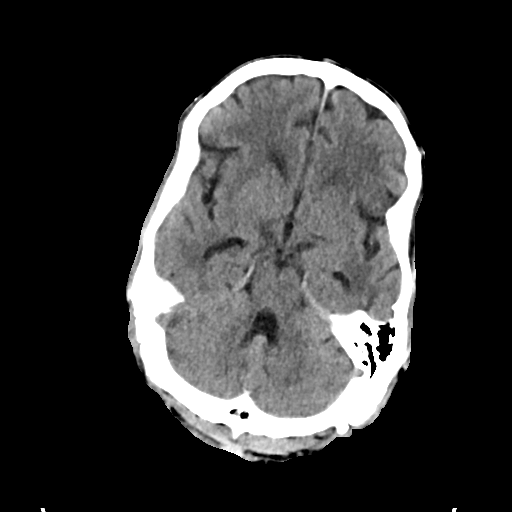
[im 15/29  brain]
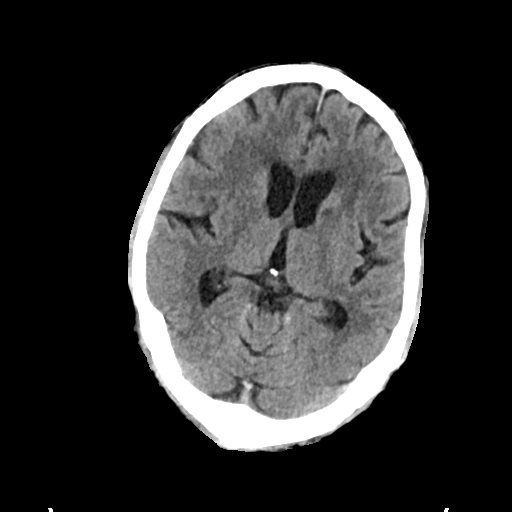
[im 18/29  brain]
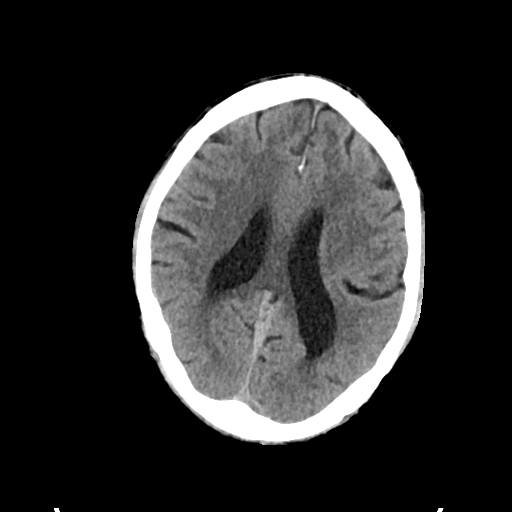
[im 18/29  bone]
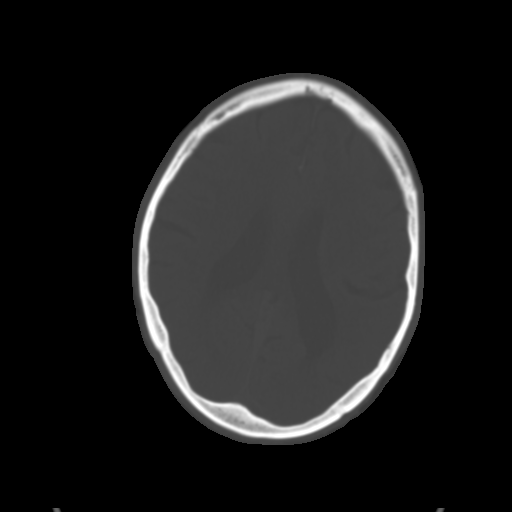
[im 22/29  brain]
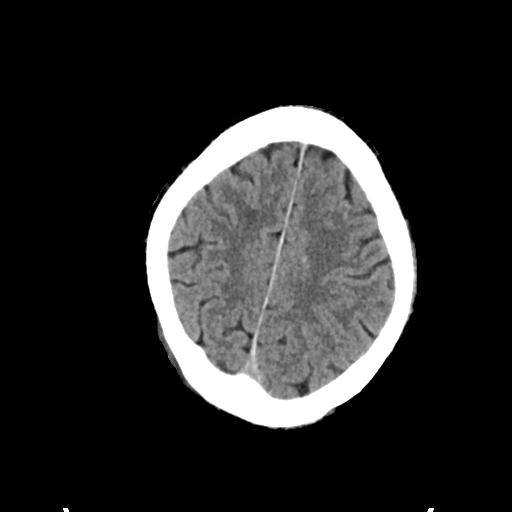
[im 25/29  brain]
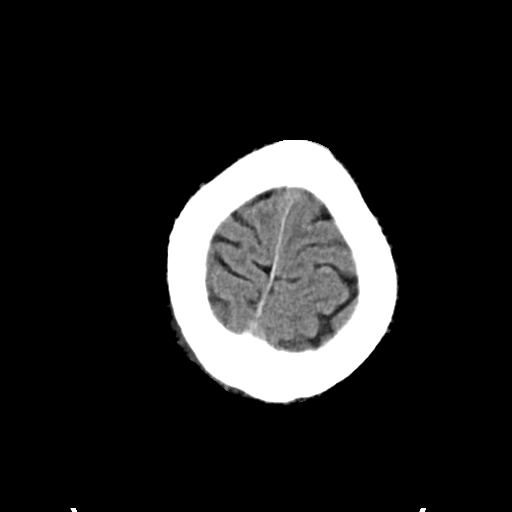

[Series 3: head bone · axial · 0.47mm/px · z∈[-118,-90]mm · 3 of 72 slices shown]
[im 8/72  bone]
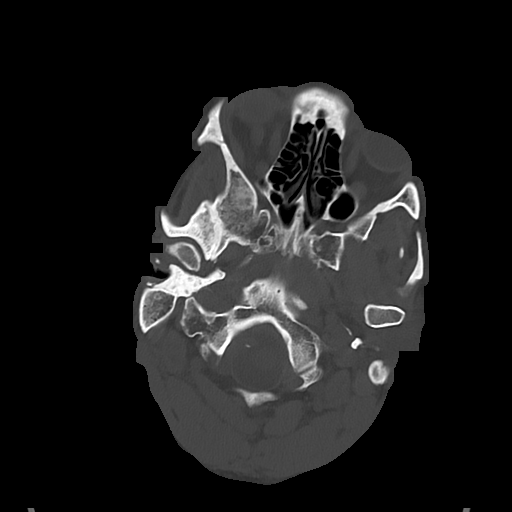
[im 15/72  bone]
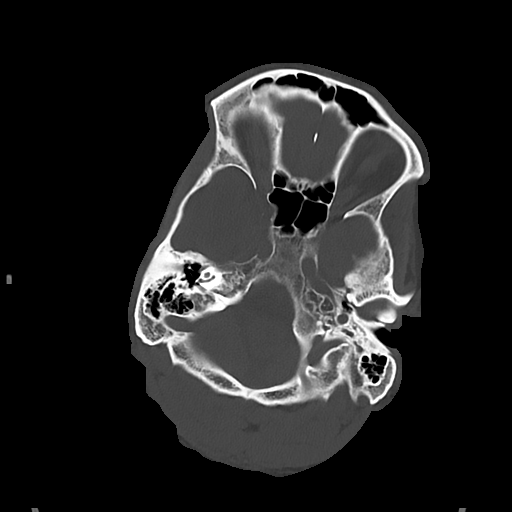
[im 22/72  bone]
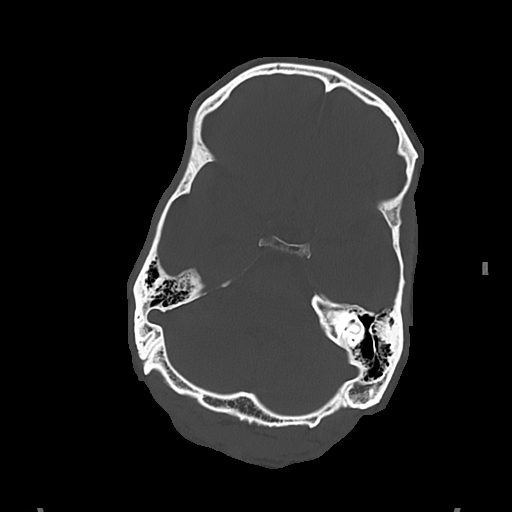

[Series 4: cor soft · coronal · 0.32mm/px · 3 of 67 slices shown]
[im 23/67  brain]
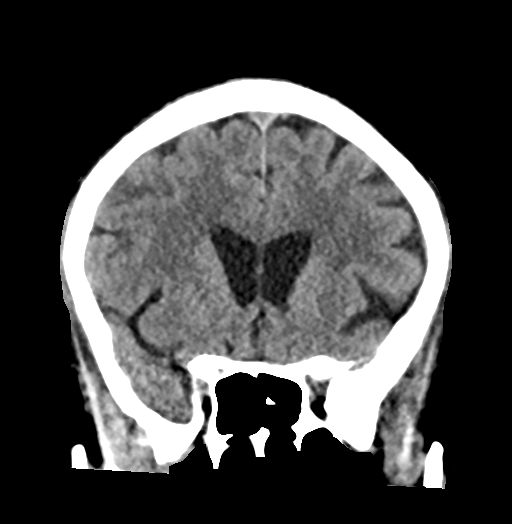
[im 30/67  brain]
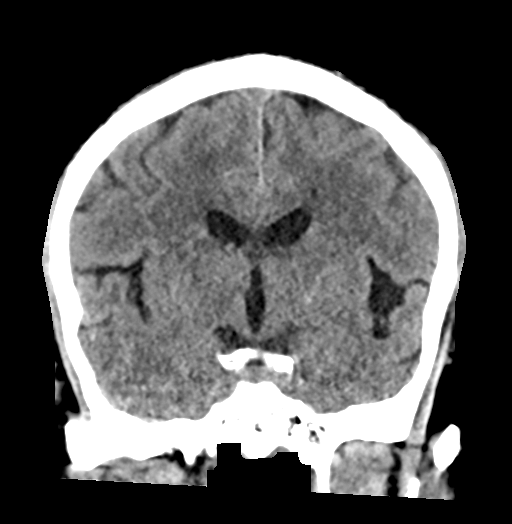
[im 37/67  brain]
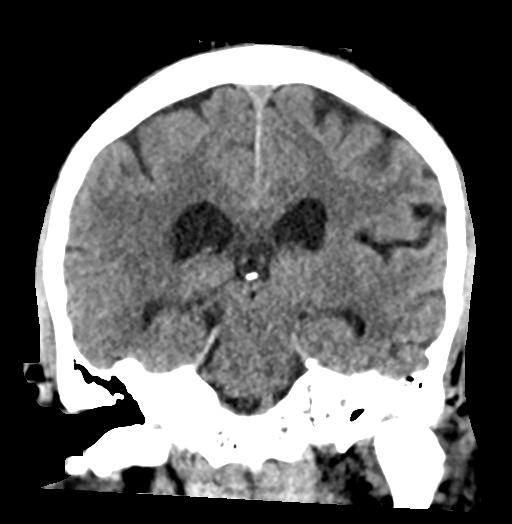

[Series 5: sag soft · sagittal · 0.33mm/px · 3 of 55 slices shown]
[im 19/55  brain]
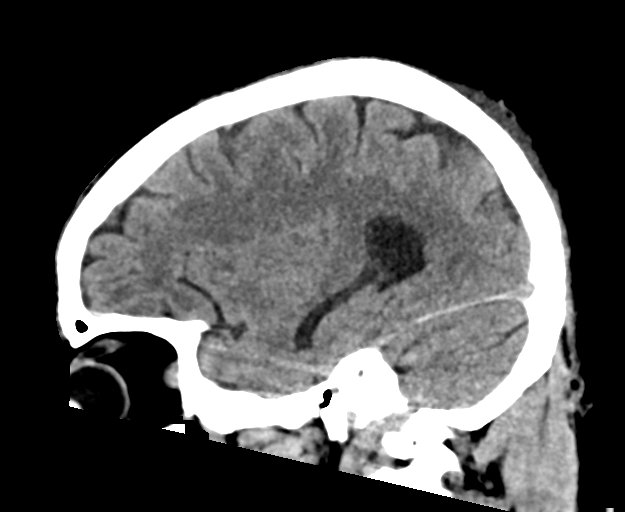
[im 28/55  brain]
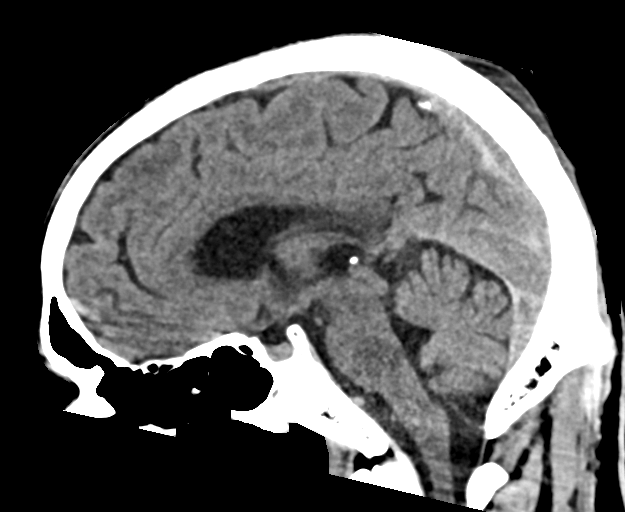
[im 37/55  brain]
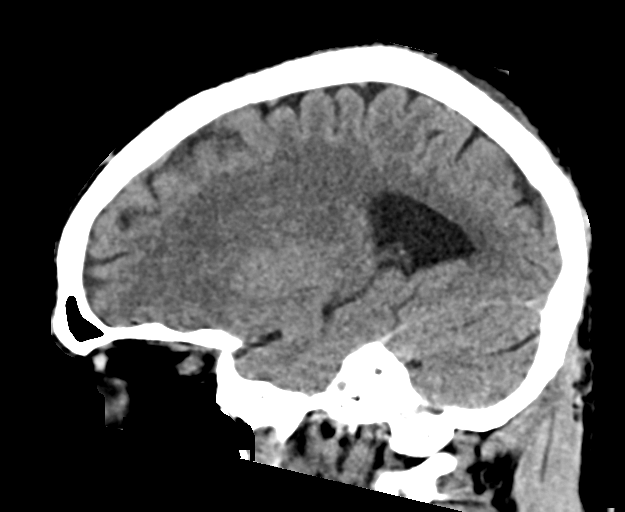

[16 of 47 positions shown; findings below may reference images not displayed]

FINDINGS: Brain: Small new hypodensity in the left caudate on series 2, image
15. Subtle dystrophic bilateral basal ganglia calcifications.

Generalized cerebral volume loss since 9361. No midline shift,
ventriculomegaly, mass effect, evidence of mass lesion, intracranial
hemorrhage or evidence of cortically based acute infarction. Outside
of the left caudate gray-white matter differentiation appears stable
and within normal limits.

Vascular: Calcified atherosclerosis at the skull base. No suspicious
intracranial vascular hyperdensity.

Skull: No acute osseous abnormality identified.

Sinuses/Orbits: Visualized paranasal sinuses and mastoids are stable
and well aerated.

Other: There is a broad-based right vertex scalp fluid collection
most resembling hematoma on series 3, image 67. Underlying calvarium
appears stable and intact. Otherwise stable and negative visible
orbit and scalp soft tissues.
IMPRESSION: 1. Right vertex scalp abnormality most resembling a small scalp
hematoma. Underlying calvarium intact.
2. Age indeterminate lacunar infarct of the left caudate nucleus,
new since 9361. Query right side weakness.
3. No other acute intracranial abnormality identified.

## 2022-03-05 IMAGING — DX DG CHEST 1V PORT
1 series · 1 of 1 positions shown · non-contrast
Comparison: Portable chest 03/08/2020 and earlier.

CLINICAL DATA: 59-year-old male with chest pain and shortness of
breath.

EXAM:
PORTABLE CHEST 1 VIEW

[chest]
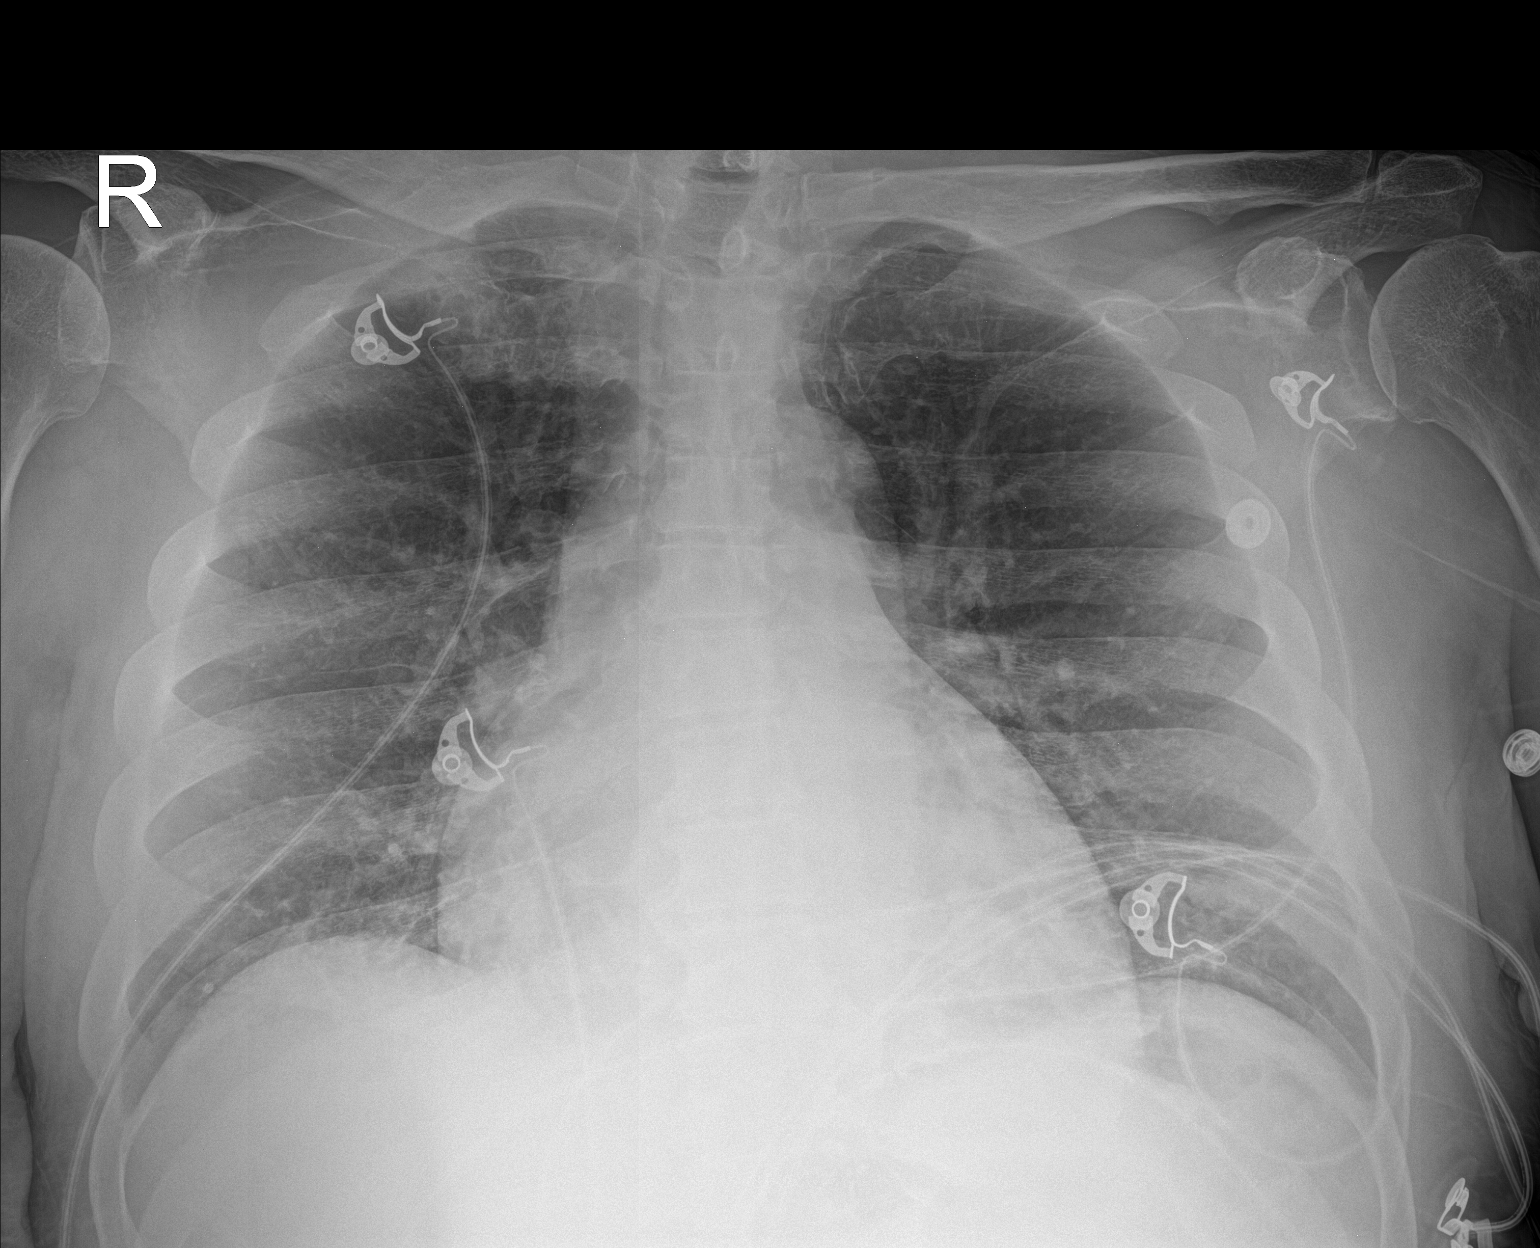

[1 of 1 positions shown; findings below may reference images not displayed]

FINDINGS: Portable AP upright view at 4748 hours. Improved lung volumes and
ventilation compared to [place]. Normalized pulmonary vascularity.
Visualized tracheal air column is within normal limits. No
pneumothorax, pleural effusion or confluent pulmonary opacity.
Stable mild cardiomegaly and other mediastinal contours.

No acute osseous abnormality identified. Prior cervical ACDF.
Paucity of bowel gas in the upper abdomen.
IMPRESSION: Resolved pulmonary edema since [REDACTED]. No acute cardiopulmonary
abnormality.

## 2022-03-06 IMAGING — DX DG ABDOMEN 1V
1 series · 1 of 1 positions shown · non-contrast
Comparison: Single-view of the abdomen 01/27/2020.

CLINICAL DATA: Onset abdominal pain and vomiting yesterday.

EXAM:
ABDOMEN - 1 VIEW

[t abdomen supine]
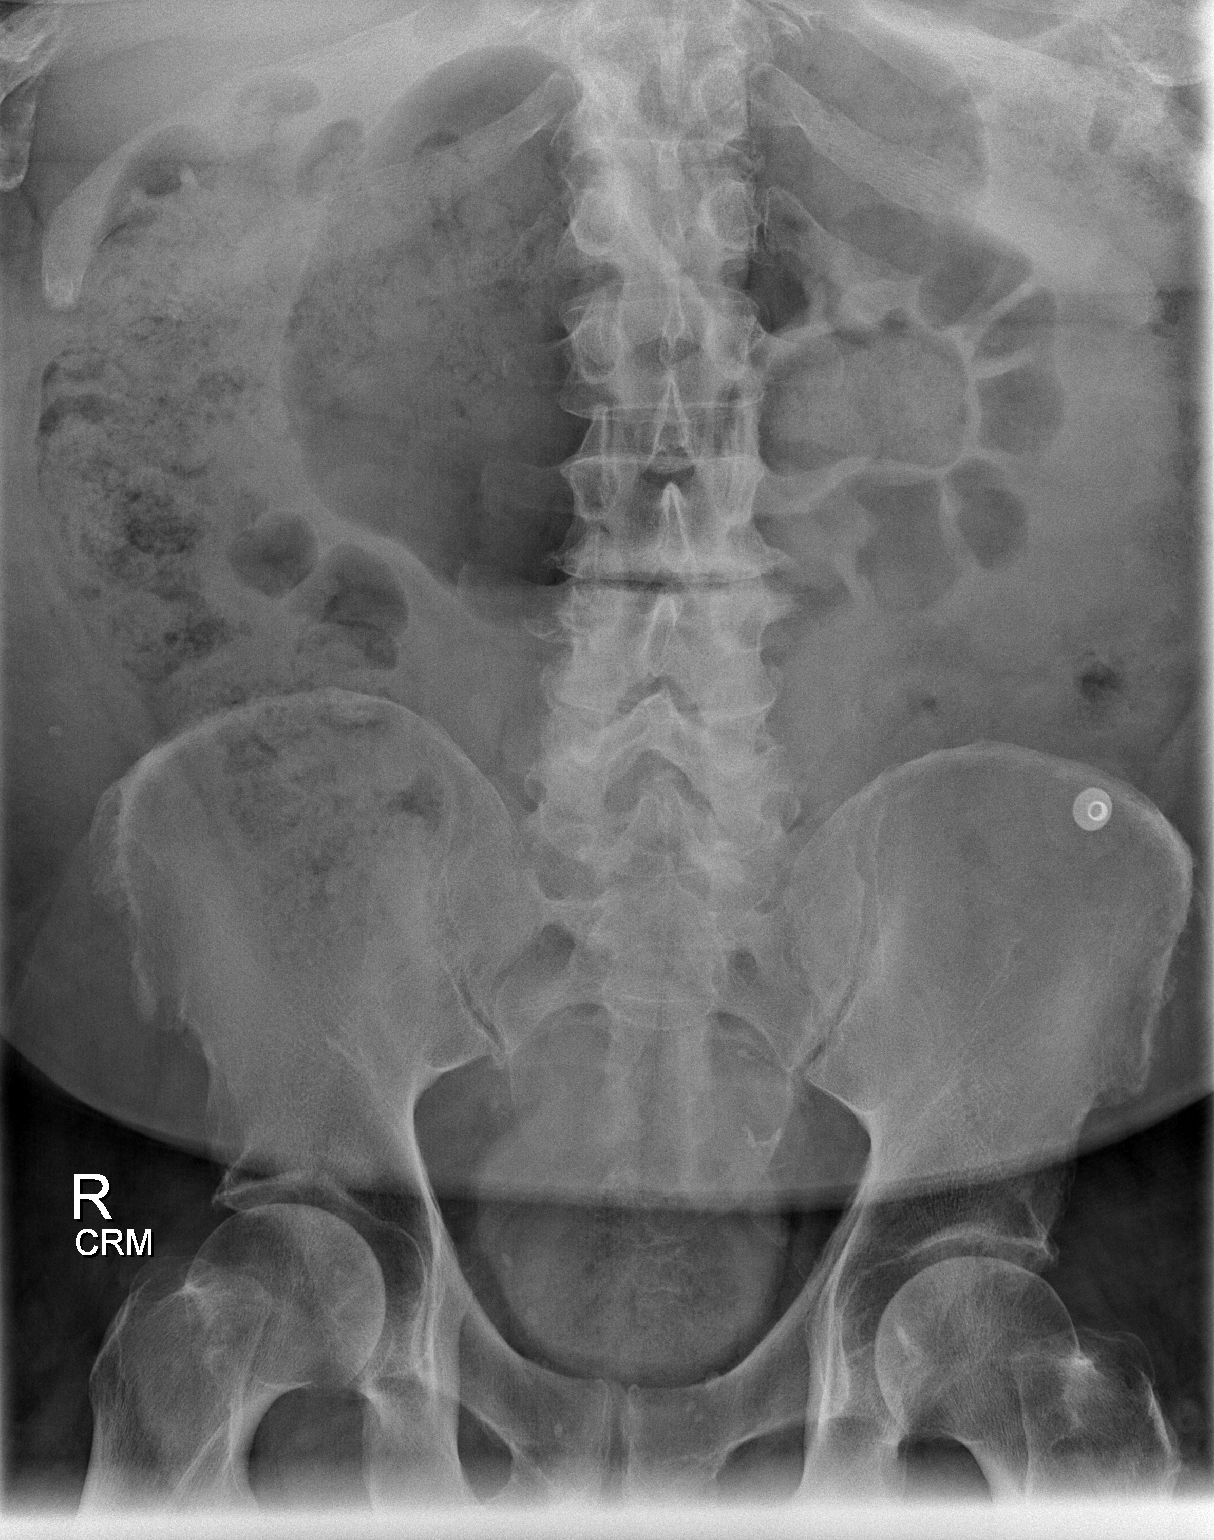

[1 of 1 positions shown; findings below may reference images not displayed]

FINDINGS: Mild gaseous distention of the stomach. No small bowel dilatation.
Large stool burden in the ascending and transverse colon noted. No
unexpected abdominal calcification.
IMPRESSION: Mild gaseous distention of the stomach is likely incidental.

Large stool burden ascending and transverse colon.

## 2022-03-10 IMAGING — CR DG CHEST 2V
2 series · 2 of 2 positions shown · non-contrast
Comparison: June 25, 2020

CLINICAL DATA: Chest pain.

EXAM:
CHEST - 2 VIEW

[chest lat]
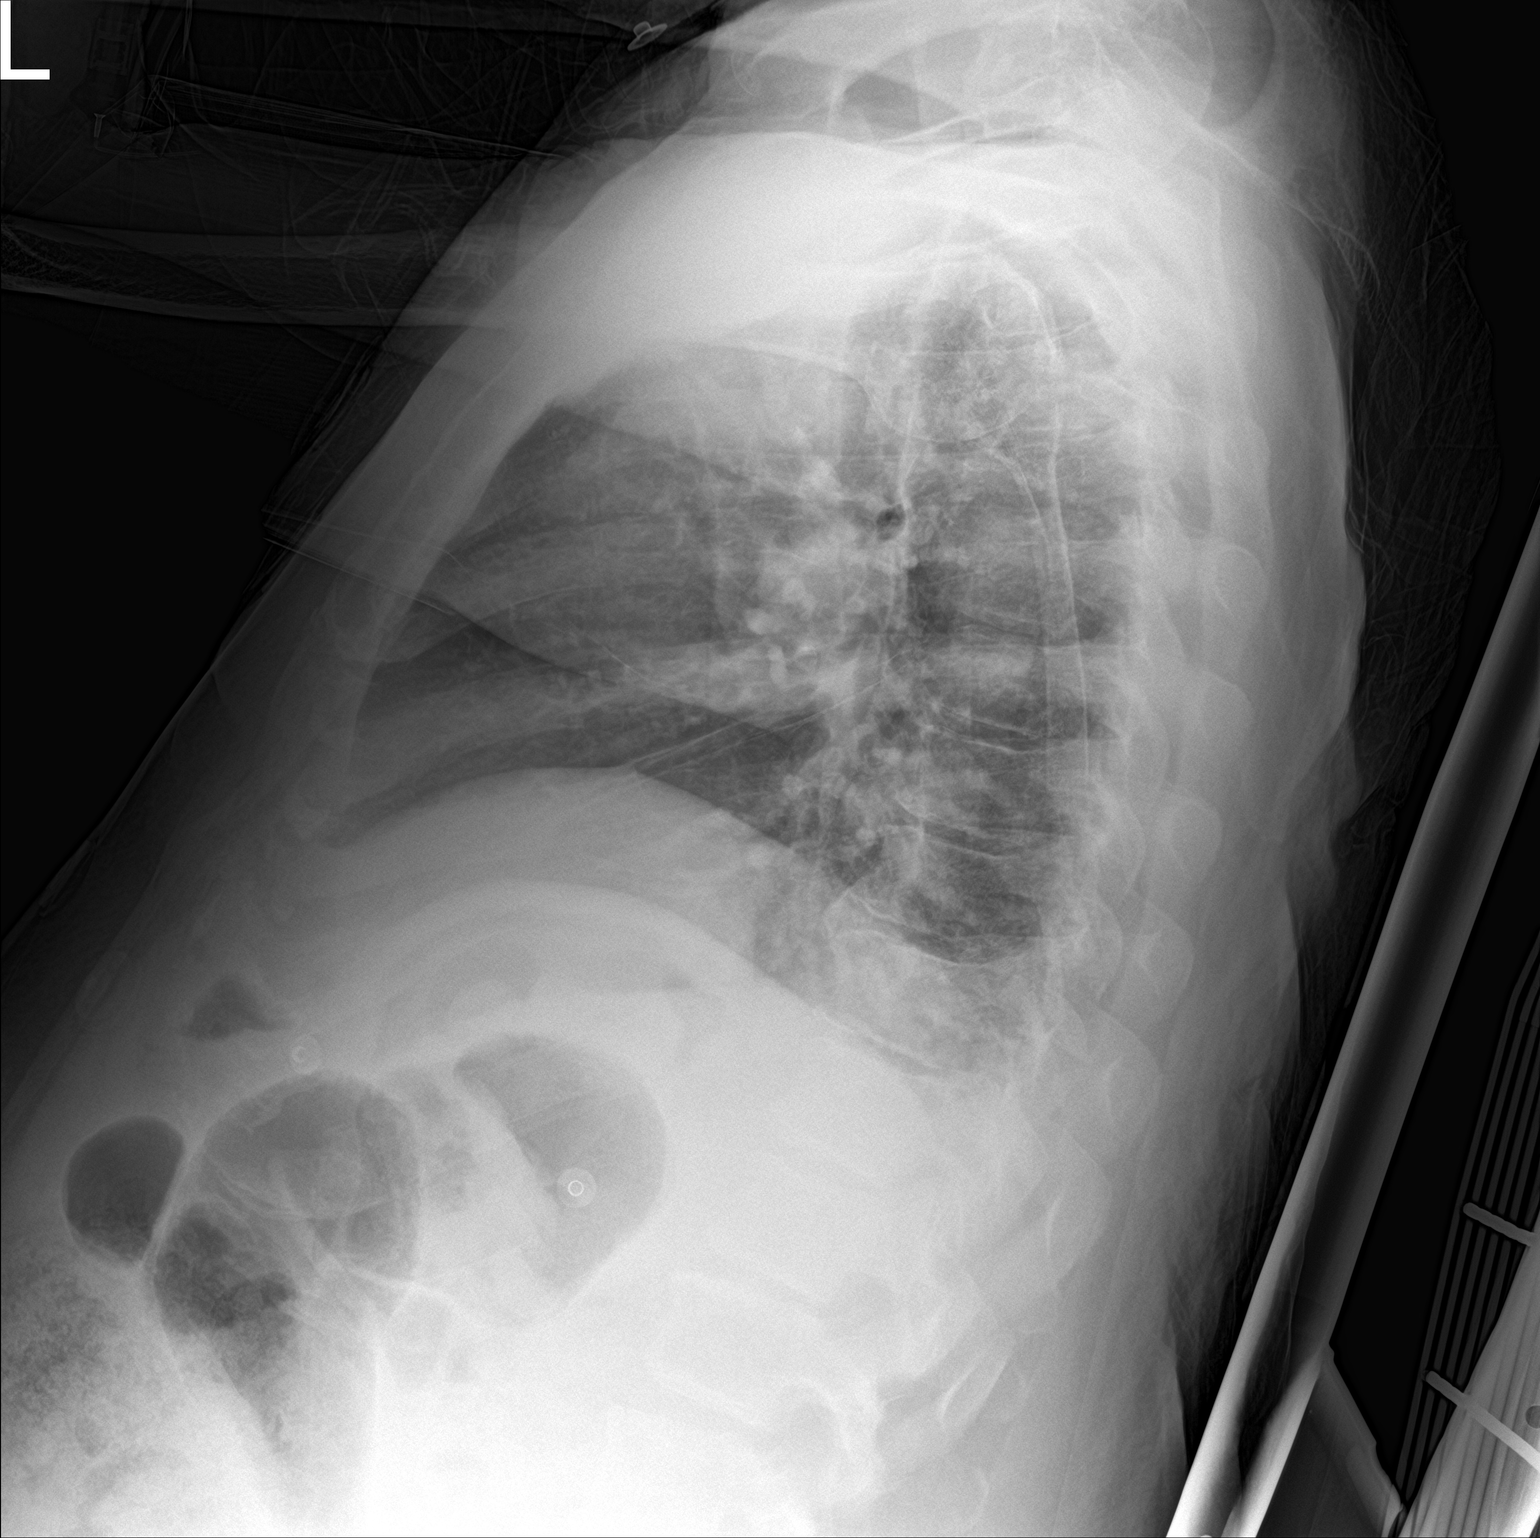

[chest ap]
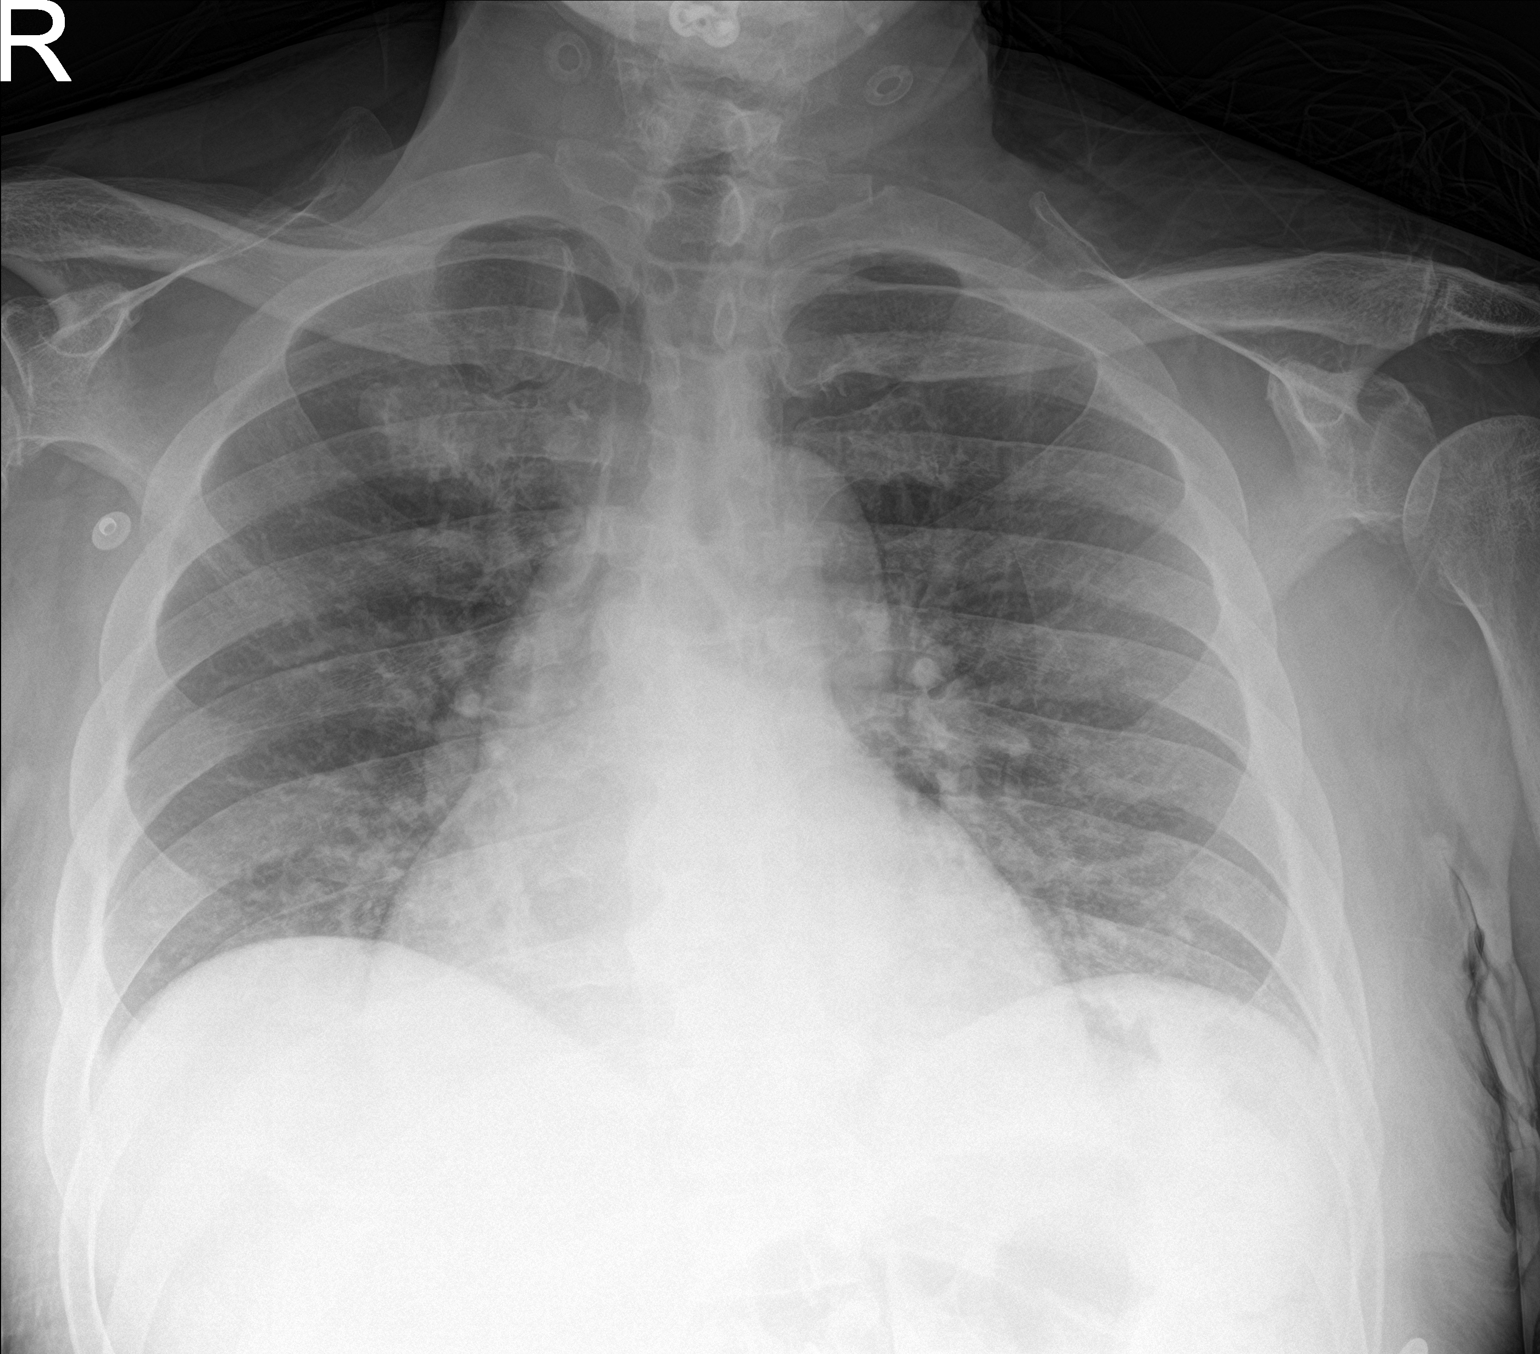

[2 of 2 positions shown; findings below may reference images not displayed]

FINDINGS: The cardiac silhouette is mildly enlarged. Both lungs are clear. The
visualized skeletal structures are unremarkable.
IMPRESSION: Stable cardiomegaly without active cardiopulmonary disease.

## 2022-03-14 IMAGING — MR MR MRA HEAD W/O CM
7 of 11 series · 22 of 48 positions shown · non-contrast
Comparison: No pertinent prior exam.
COMPARISON: Head CT 07/02/2020

CLINICAL DATA: Transient ischemic attack.  Right-sided weakness.

EXAM:
MRI HEAD WITHOUT CONTRAST
MRA HEAD WITHOUT CONTRAST
TECHNIQUE: Multiplanar, multi-echo pulse sequences of the brain and surrounding
structures were acquired without intravenous contrast. Angiographic
images of the Circle of Willis were acquired using MRA technique
without intravenous contrast.

[Series 3: DWI · axial · 3.0mm · 0.94mm/px · z∈[-86,+50]mm · 6 of 91 slices shown (1 of 2)]
[im 1/91]
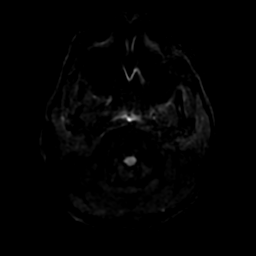
[im 19/91]
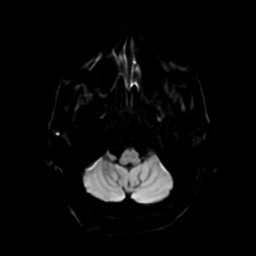
[im 37/91]
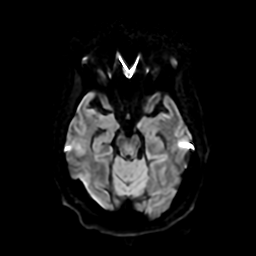
[im 55/91]
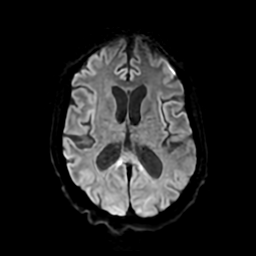
[im 73/91]
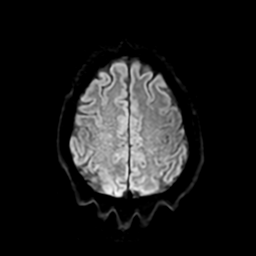
[im 91/91]
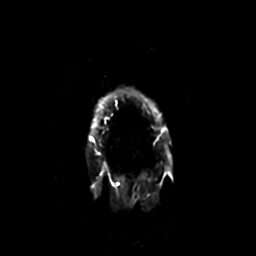

[Series 5: DWI · coronal · 4.0mm · 0.94mm/px · 5 of 70 slices shown (2 of 2)]
[im 1/70]
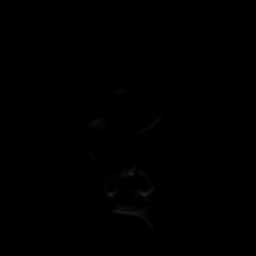
[im 18/70]
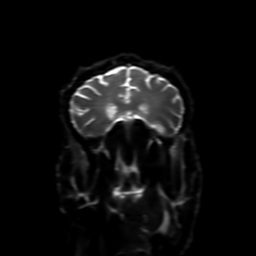
[im 35/70]
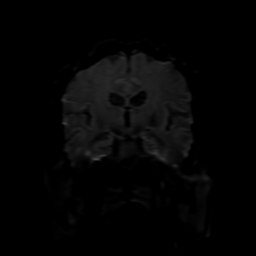
[im 52/70]
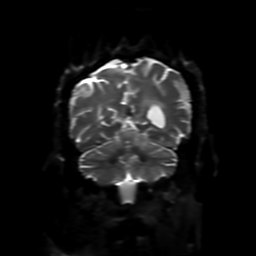
[im 70/70]
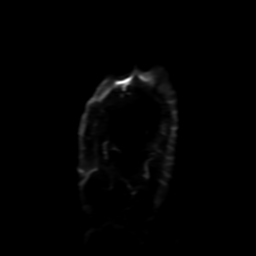

[Series 6: FLAIR · sagittal · 5.0mm · 0.23mm/px · 2 of 23 slices shown (1 of 2)]
[im 1/23]
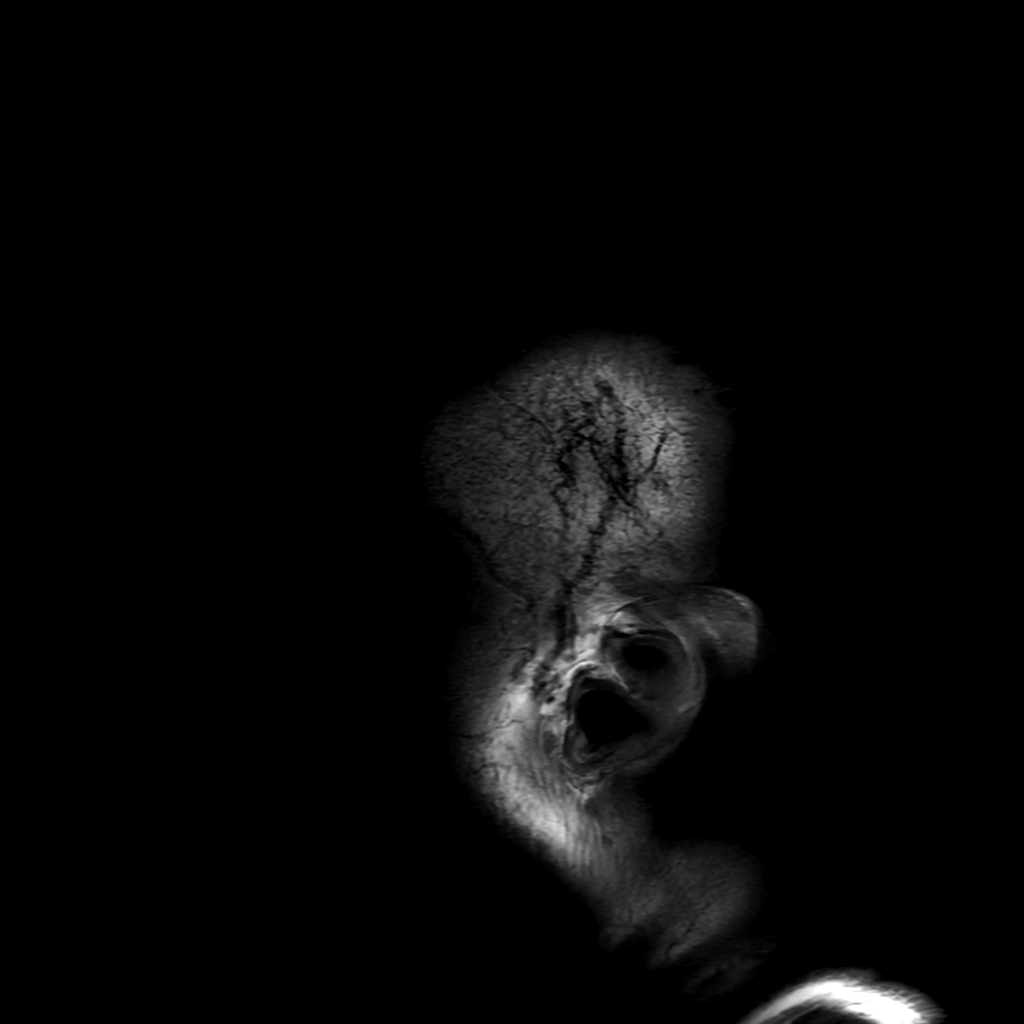
[im 23/23]
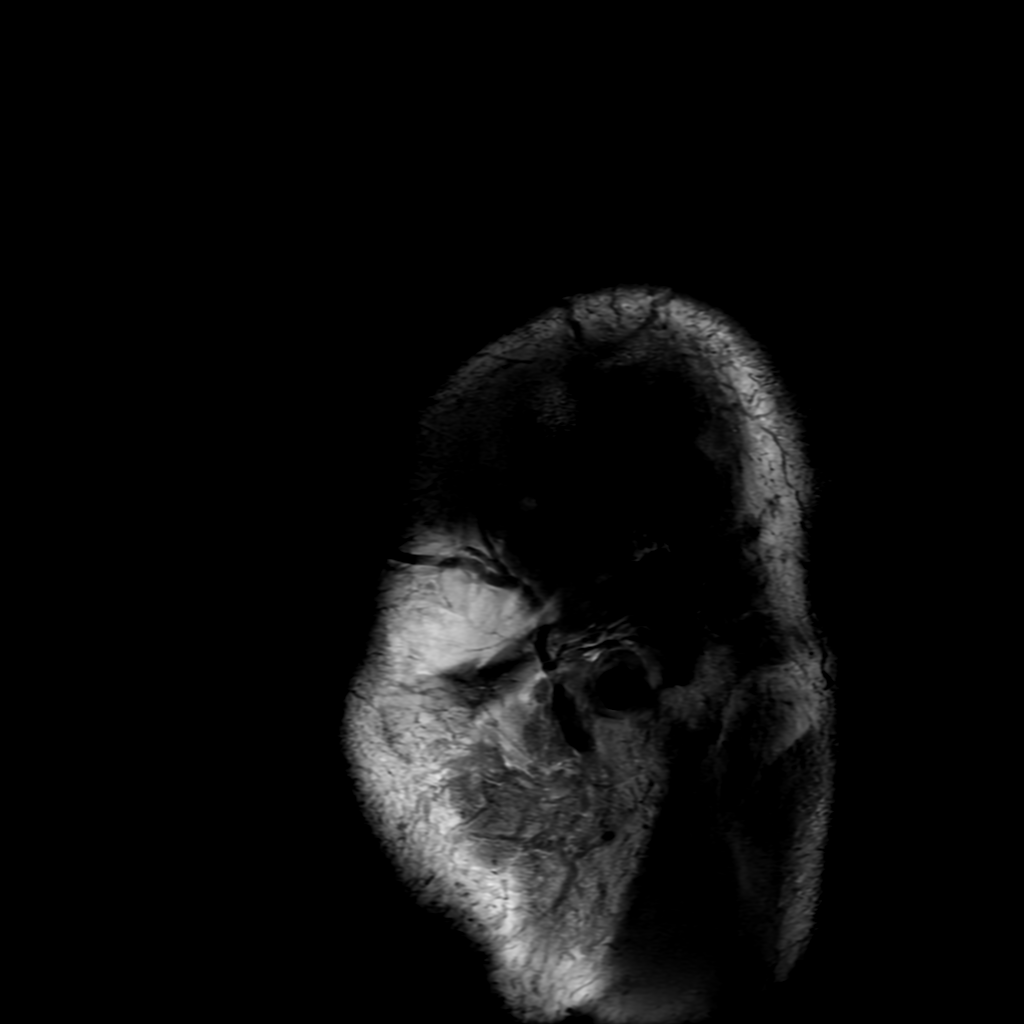

[Series 7: T2 · axial · 5.0mm · 0.23mm/px · 1 of 24 slices shown]
[im 1/24]
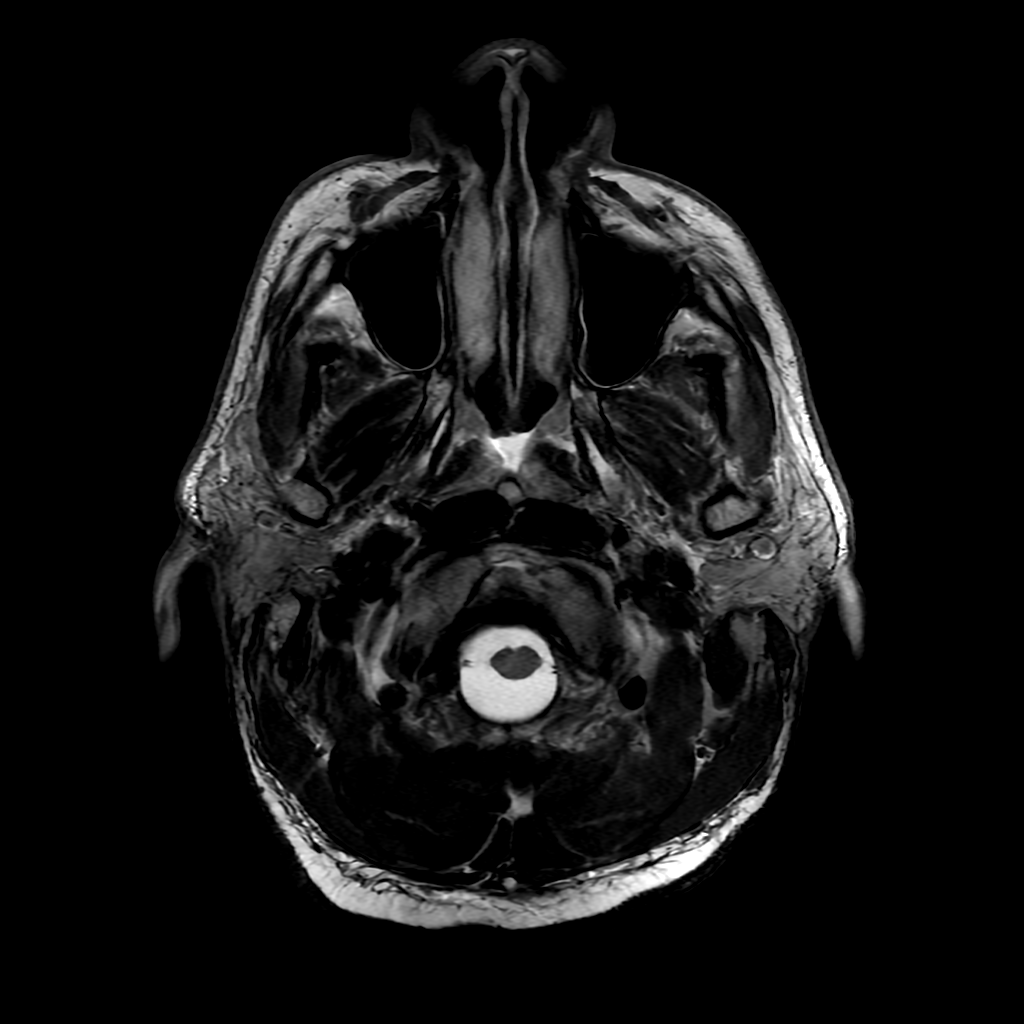

[Series 8: FLAIR · axial · 3.0mm · 0.45mm/px · z∈[-77,+53]mm · 2 of 23 slices shown (2 of 2)]
[im 1/23]
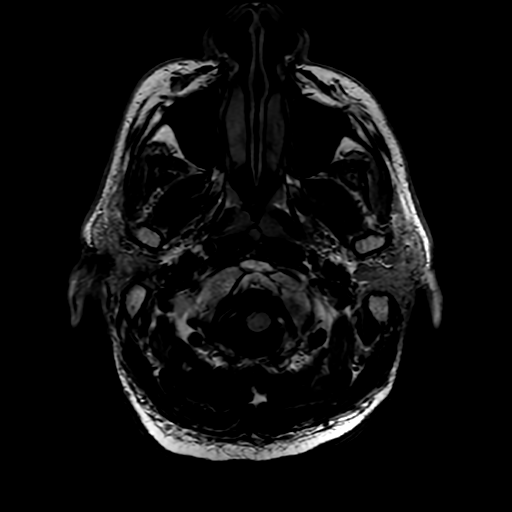
[im 23/23]
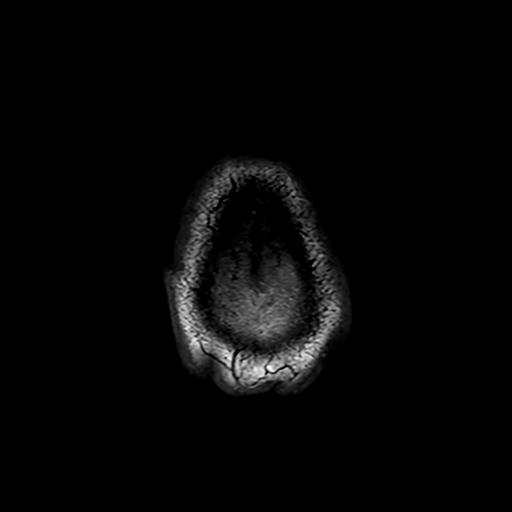

[Series 350: ADC · axial · 3.0mm · 0.94mm/px · z∈[-86,+50]mm · 3 of 46 slices shown (1 of 2)]
[im 1/46]
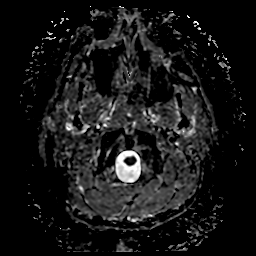
[im 23/46]
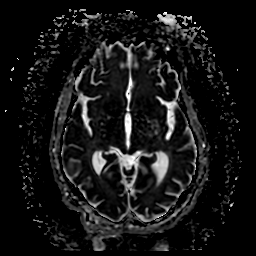
[im 46/46]
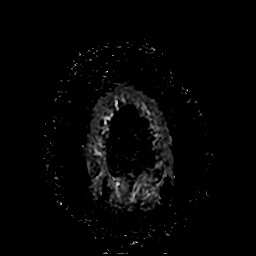

[Series 550: ADC · coronal · 4.0mm · 0.94mm/px · 3 of 35 slices shown (2 of 2)]
[im 1/35]
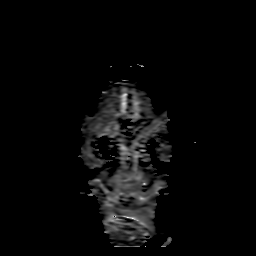
[im 18/35]
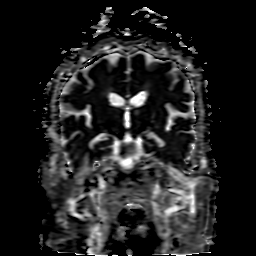
[im 35/35]
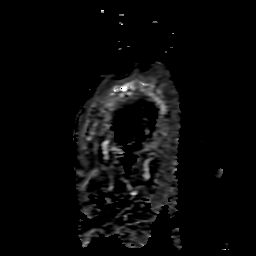

[22 of 48 positions shown; findings below may reference images not displayed]

FINDINGS: MRI HEAD FINDINGS

Brain: Chronic small vessel ischemic type changes of the pons, with
hemosiderin deposition. No sign of acute infarction in that
location. No focal cerebellar finding. Cerebral hemispheres show
extensive abnormal T2 and FLAIR signal within the cerebral
hemispheric deep white matter and splenium of the corpus callosum.
The pattern could represent small vessel disease, demyelinating
disease or a combination of both. No cortical or large vessel
territory infarction. Diffusion imaging suggests minimal restricted
diffusion in the right splenium of the corpus callosum and right
frontal deep white matter, but these could possibly be areas of T2
shine through. No mass lesion, acute hemorrhage, hydrocephalus or
extra-axial collection.

Vascular: Major vessels at the base of the brain show flow.

Skull and upper cervical spine: Negative

Sinuses/Orbits: Clear/normal

Other: None

MRA HEAD FINDINGS

Anterior circulation: Both internal carotid arteries are widely
patent through the skull base and siphon regions. The anterior and
middle cerebral vessels are patent. No large or medium vessel
occlusion. No apparent correctable proximal stenosis. No aneurysm or
vascular malformation.

Posterior circulation: Left vertebral artery terminates in PICA.
Right vertebral artery is widely patent to the basilar. No basilar
stenosis. Posterior circulation branch vessels appear normal. Right
PCA receives most of it supply from the anterior circulation.

Anatomic variants: None other significant.
IMPRESSION: No intracranial large or medium vessel occlusion or correctable
proximal stenosis identified.

Abnormal brain MRI showing abnormal signal within the pons and
hemispheric deep and subcortical white matter, including the
splenium of the corpus callosum. Hemosiderin deposition within the
areas of prior pontine insult. Findings could be due entirely to
chronic small vessel disease, but the possibility of coexisting
demyelinating disease is not excluded. Additionally, diffusion
imaging suggests low level restricted diffusion in the splenium of
the corpus callosum and right frontal white matter, though this
could possibly be T2 shine through. It does raise the possibility of
either subacute ischemic changes or active demyelinating disease.

## 2022-03-17 ENCOUNTER — Inpatient Hospital Stay (HOSPITAL_COMMUNITY)
Admission: EM | Admit: 2022-03-17 | Discharge: 2022-04-02 | DRG: 291 | Disposition: A | Discharge status: Skilled Nursing Facility | Payer: Medicare (Managed Care) | Source: Skilled Nursing Facility | Attending: Internal Medicine | Admitting: Internal Medicine

## 2022-03-17 ENCOUNTER — Emergency Department (HOSPITAL_COMMUNITY): Payer: Medicare (Managed Care)

## 2022-03-17 ENCOUNTER — Other Ambulatory Visit: Payer: Self-pay

## 2022-03-17 ENCOUNTER — Encounter (HOSPITAL_COMMUNITY): Payer: Self-pay | Admitting: Internal Medicine

## 2022-03-17 DIAGNOSIS — I130 Hypertensive heart and chronic kidney disease with heart failure and stage 1 through stage 4 chronic kidney disease, or unspecified chronic kidney disease: Secondary | ICD-10-CM | POA: Diagnosis not present

## 2022-03-17 DIAGNOSIS — Z66 Do not resuscitate: Secondary | ICD-10-CM | POA: Diagnosis present

## 2022-03-17 DIAGNOSIS — E1169 Type 2 diabetes mellitus with other specified complication: Secondary | ICD-10-CM | POA: Insufficient documentation

## 2022-03-17 DIAGNOSIS — N179 Acute kidney failure, unspecified: Secondary | ICD-10-CM | POA: Diagnosis present

## 2022-03-17 DIAGNOSIS — Z833 Family history of diabetes mellitus: Secondary | ICD-10-CM

## 2022-03-17 DIAGNOSIS — I5033 Acute on chronic diastolic (congestive) heart failure: Secondary | ICD-10-CM

## 2022-03-17 DIAGNOSIS — E119 Type 2 diabetes mellitus without complications: Secondary | ICD-10-CM | POA: Insufficient documentation

## 2022-03-17 DIAGNOSIS — Z1152 Encounter for screening for COVID-19: Secondary | ICD-10-CM

## 2022-03-17 DIAGNOSIS — Z794 Long term (current) use of insulin: Secondary | ICD-10-CM

## 2022-03-17 DIAGNOSIS — R4589 Other symptoms and signs involving emotional state: Secondary | ICD-10-CM

## 2022-03-17 DIAGNOSIS — Z8673 Personal history of transient ischemic attack (TIA), and cerebral infarction without residual deficits: Secondary | ICD-10-CM

## 2022-03-17 DIAGNOSIS — Z7189 Other specified counseling: Secondary | ICD-10-CM

## 2022-03-17 DIAGNOSIS — J9601 Acute respiratory failure with hypoxia: Secondary | ICD-10-CM

## 2022-03-17 DIAGNOSIS — Z7401 Bed confinement status: Secondary | ICD-10-CM

## 2022-03-17 DIAGNOSIS — E876 Hypokalemia: Secondary | ICD-10-CM | POA: Diagnosis present

## 2022-03-17 DIAGNOSIS — E042 Nontoxic multinodular goiter: Secondary | ICD-10-CM | POA: Diagnosis present

## 2022-03-17 DIAGNOSIS — N184 Chronic kidney disease, stage 4 (severe): Secondary | ICD-10-CM | POA: Diagnosis not present

## 2022-03-17 DIAGNOSIS — D72819 Decreased white blood cell count, unspecified: Secondary | ICD-10-CM | POA: Diagnosis present

## 2022-03-17 DIAGNOSIS — I10 Essential (primary) hypertension: Secondary | ICD-10-CM

## 2022-03-17 DIAGNOSIS — F319 Bipolar disorder, unspecified: Secondary | ICD-10-CM | POA: Diagnosis present

## 2022-03-17 DIAGNOSIS — I69351 Hemiplegia and hemiparesis following cerebral infarction affecting right dominant side: Secondary | ICD-10-CM

## 2022-03-17 DIAGNOSIS — K767 Hepatorenal syndrome: Secondary | ICD-10-CM | POA: Diagnosis present

## 2022-03-17 DIAGNOSIS — Z8249 Family history of ischemic heart disease and other diseases of the circulatory system: Secondary | ICD-10-CM

## 2022-03-17 DIAGNOSIS — L89159 Pressure ulcer of sacral region, unspecified stage: Secondary | ICD-10-CM | POA: Diagnosis present

## 2022-03-17 DIAGNOSIS — J189 Pneumonia, unspecified organism: Secondary | ICD-10-CM

## 2022-03-17 DIAGNOSIS — I639 Cerebral infarction, unspecified: Secondary | ICD-10-CM | POA: Diagnosis present

## 2022-03-17 DIAGNOSIS — F40240 Claustrophobia: Secondary | ICD-10-CM | POA: Diagnosis present

## 2022-03-17 DIAGNOSIS — N401 Enlarged prostate with lower urinary tract symptoms: Secondary | ICD-10-CM | POA: Diagnosis present

## 2022-03-17 DIAGNOSIS — R0602 Shortness of breath: Secondary | ICD-10-CM | POA: Diagnosis not present

## 2022-03-17 DIAGNOSIS — D631 Anemia in chronic kidney disease: Secondary | ICD-10-CM | POA: Diagnosis present

## 2022-03-17 DIAGNOSIS — Z89512 Acquired absence of left leg below knee: Secondary | ICD-10-CM

## 2022-03-17 DIAGNOSIS — Z7982 Long term (current) use of aspirin: Secondary | ICD-10-CM

## 2022-03-17 DIAGNOSIS — E1122 Type 2 diabetes mellitus with diabetic chronic kidney disease: Secondary | ICD-10-CM

## 2022-03-17 DIAGNOSIS — Z515 Encounter for palliative care: Secondary | ICD-10-CM

## 2022-03-17 DIAGNOSIS — Z823 Family history of stroke: Secondary | ICD-10-CM

## 2022-03-17 DIAGNOSIS — Z809 Family history of malignant neoplasm, unspecified: Secondary | ICD-10-CM

## 2022-03-17 DIAGNOSIS — Z79899 Other long term (current) drug therapy: Secondary | ICD-10-CM

## 2022-03-17 DIAGNOSIS — I129 Hypertensive chronic kidney disease with stage 1 through stage 4 chronic kidney disease, or unspecified chronic kidney disease: Secondary | ICD-10-CM

## 2022-03-17 DIAGNOSIS — R338 Other retention of urine: Secondary | ICD-10-CM | POA: Diagnosis present

## 2022-03-17 DIAGNOSIS — E872 Acidosis, unspecified: Secondary | ICD-10-CM | POA: Diagnosis present

## 2022-03-17 DIAGNOSIS — I493 Ventricular premature depolarization: Secondary | ICD-10-CM | POA: Diagnosis present

## 2022-03-17 DIAGNOSIS — E1151 Type 2 diabetes mellitus with diabetic peripheral angiopathy without gangrene: Secondary | ICD-10-CM | POA: Diagnosis present

## 2022-03-17 LAB — RESP PANEL BY RT-PCR (RSV, FLU A&B, COVID)  RVPGX2
Influenza A by PCR: NEGATIVE
Influenza B by PCR: NEGATIVE
Resp Syncytial Virus by PCR: NEGATIVE
SARS Coronavirus 2 by RT PCR: NEGATIVE

## 2022-03-17 LAB — CBC WITH DIFFERENTIAL/PLATELET
Abs Immature Granulocytes: 0.03 10*3/uL (ref 0.00–0.07)
Basophils Absolute: 0 10*3/uL (ref 0.0–0.1)
Basophils Relative: 0 %
Eosinophils Absolute: 0.1 10*3/uL (ref 0.0–0.5)
Eosinophils Relative: 2 %
HCT: 26.6 % — ABNORMAL LOW (ref 39.0–52.0)
Hemoglobin: 8.1 g/dL — ABNORMAL LOW (ref 13.0–17.0)
Immature Granulocytes: 1 %
Lymphocytes Relative: 13 %
Lymphs Abs: 0.8 10*3/uL (ref 0.7–4.0)
MCH: 25.6 pg — ABNORMAL LOW (ref 26.0–34.0)
MCHC: 30.5 g/dL (ref 30.0–36.0)
MCV: 83.9 fL (ref 80.0–100.0)
Monocytes Absolute: 0.4 10*3/uL (ref 0.1–1.0)
Monocytes Relative: 6 %
Neutro Abs: 4.9 10*3/uL (ref 1.7–7.7)
Neutrophils Relative %: 78 %
Platelets: 174 10*3/uL (ref 150–400)
RBC: 3.17 MIL/uL — ABNORMAL LOW (ref 4.22–5.81)
RDW: 17.2 % — ABNORMAL HIGH (ref 11.5–15.5)
WBC: 6.3 10*3/uL (ref 4.0–10.5)
nRBC: 0 % (ref 0.0–0.2)

## 2022-03-17 LAB — BRAIN NATRIURETIC PEPTIDE: B Natriuretic Peptide: 370.1 pg/mL — ABNORMAL HIGH (ref 0.0–100.0)

## 2022-03-17 LAB — COMPREHENSIVE METABOLIC PANEL
ALT: 18 U/L (ref 0–44)
AST: 16 U/L (ref 15–41)
Albumin: 2.7 g/dL — ABNORMAL LOW (ref 3.5–5.0)
Alkaline Phosphatase: 115 U/L (ref 38–126)
Anion gap: 5 (ref 5–15)
BUN: 57 mg/dL — ABNORMAL HIGH (ref 6–20)
CO2: 17 mmol/L — ABNORMAL LOW (ref 22–32)
Calcium: 7.8 mg/dL — ABNORMAL LOW (ref 8.9–10.3)
Chloride: 112 mmol/L — ABNORMAL HIGH (ref 98–111)
Creatinine, Ser: 3.22 mg/dL — ABNORMAL HIGH (ref 0.61–1.24)
GFR, Estimated: 21 mL/min — ABNORMAL LOW (ref >60)
Glucose, Bld: 171 mg/dL — ABNORMAL HIGH (ref 70–99)
Potassium: 4.3 mmol/L (ref 3.5–5.1)
Sodium: 134 mmol/L — ABNORMAL LOW (ref 135–145)
Total Bilirubin: 0.5 mg/dL (ref 0.3–1.2)
Total Protein: 7 g/dL (ref 6.5–8.1)

## 2022-03-17 LAB — TROPONIN I (HIGH SENSITIVITY)
Troponin I (High Sensitivity): 25 ng/L — ABNORMAL HIGH (ref <18)
Troponin I (High Sensitivity): 38 ng/L — ABNORMAL HIGH (ref <18)

## 2022-03-17 LAB — BLOOD GAS, VENOUS
Acid-base deficit: 6.1 mmol/L — ABNORMAL HIGH (ref 0.0–2.0)
Bicarbonate: 18.9 mmol/L — ABNORMAL LOW (ref 20.0–28.0)
O2 Saturation: 97.2 %
Patient temperature: 37
pCO2, Ven: 35 mmHg — ABNORMAL LOW (ref 44–60)
pH, Ven: 7.34 (ref 7.25–7.43)
pO2, Ven: 71 mmHg — ABNORMAL HIGH (ref 32–45)

## 2022-03-17 MED ORDER — CLONIDINE HCL 0.1 MG PO TABS
0.1000 mg | ORAL_TABLET | Freq: Two times a day (BID) | ORAL | Status: DC
  Administered 2022-03-17: 0.1 mg via ORAL
  Filled 2022-03-17: qty 1

## 2022-03-17 MED ORDER — SODIUM CHLORIDE 0.9 % IV SOLN
500.0000 mg | Freq: Once | INTRAVENOUS | Status: AC
  Administered 2022-03-17: 500 mg via INTRAVENOUS
  Filled 2022-03-17: qty 5

## 2022-03-17 MED ORDER — SODIUM CHLORIDE 0.9 % IV SOLN
1.0000 g | Freq: Once | INTRAVENOUS | Status: AC
  Administered 2022-03-17: 1 g via INTRAVENOUS
  Filled 2022-03-17: qty 10

## 2022-03-17 MED ORDER — ALBUMIN HUMAN 25 % IV SOLN
25.0000 g | Freq: Once | INTRAVENOUS | Status: AC
  Administered 2022-03-17: 25 g via INTRAVENOUS
  Filled 2022-03-17: qty 100

## 2022-03-17 MED ORDER — FUROSEMIDE 10 MG/ML IJ SOLN
80.0000 mg | Freq: Once | INTRAMUSCULAR | Status: AC
  Administered 2022-03-17: 80 mg via INTRAVENOUS
  Filled 2022-03-17: qty 8

## 2022-03-17 NOTE — H&P (Signed)
History and Physical    LENIN KUHNLE ZOX:[id number] DOB: 1960-07-23 DOA: 03/17/2022  DOS: the patient was seen and examined on 03/17/2022  PCP: Jodi Marble, MD   Patient coming from: SNF [facility]  I have personally briefly reviewed patient's old medical records in [place]  CC: SOB, wheezing HPI: 62 year old African-American male lives at [facility] with a history of chronic diastolic heart failure EF of 60%, CKD stage IV baseline creatinine 2.6-3.0, type 2 diabetes on insulin, peripheral vascular disease status post left below-knee amputation, history of CVA with residual right-sided hemiparesis, hypertension, BPH who presents to the ER today with shortness of breath and hypoxia.  Apparently EMS was called out to [place] due to shortness of breath.  He was noted to be in respiratory distress and started on CPAP and oxygen at the facility when EMS arrived.  O2 saturations were 88%.  On arrival to the ER, temp 98.1 heart rate 104 blood pressure 164/98.  Labs showed a BNP of 370  COVID-negative, influenza negative, RSV negative  Sodium 134, bicarb 17, BUN of 58, creatinine 3.2 albumin 2.7  White count 6.3, hemoglobin 8.1, platelets of 174  CT chest demonstrated diffuse groundglass opacities throughout both lungs mostly in the upper lobes.  Trace bilateral pleural effusions.  Body wall edema.  [facility] hospitalist contacted for admission.   ED Course: BNP 370(baseline 200), WBC 6.3  Review of Systems:  Review of Systems  Constitutional: Negative.   HENT: Negative.    Eyes: Negative.   Respiratory:  Positive for cough and shortness of breath.   Cardiovascular:  Positive for leg swelling.  Gastrointestinal: Negative.   Genitourinary: Negative.   Musculoskeletal: Negative.   Skin: Negative.   Neurological:  Positive for weakness.       Chronic weakness of right UE/right LE  Endo/Heme/Allergies: Negative.   Psychiatric/Behavioral:  Negative.    All other systems reviewed and are negative.   Past Medical History:  Diagnosis Date   Acute on chronic diastolic CHF (congestive heart failure) ([place]) 08/22/2020   Acute on chronic heart failure with preserved ejection fraction (HCC)    Acute renal failure superimposed on stage 3a chronic kidney disease ([place]) 11/18/2020   Arthritis    Bacteremia due to Enterococcus 12/09/2021   Bipolar affective ([place])    CHF (congestive heart failure) ([place])    Claustrophobia    Severe   Cocaine abuse ([place]) 05/31/2014   Cocaine dependence, uncomplicated ([place]) 81/19/1478   Depression    Diabetes mellitus    uncontrolled    Elevated CK    Heart murmur    Hypertension    Ileus ([place]) 29/56/2130   Metabolic acidosis 86/57/8469   Non-traumatic rhabdomyolysis 01/01/2020   Pericardial effusion 08/22/2020   Severe recurrent major depression with psychotic features ([place]) 01/04/2015   Suicidal ideations 08/28/2013    Past Surgical History:  Procedure Laterality Date   ABDOMINAL AORTOGRAM W/LOWER EXTREMITY Left 03/02/2021   Procedure: ABDOMINAL AORTOGRAM W/LOWER EXTREMITY;  Surgeon: Cherre Robins, MD;  Location: [facility];  Service: Cardiovascular;  Laterality: Left;   AMPUTATION Left 03/07/2021   Procedure: LEFT BELOW KNEE AMPUTATION;  Surgeon: Cherre Robins, MD;  Location: [facility];  Service: Vascular;  Laterality: Left;   APPLICATION OF WOUND VAC Left 03/07/2021   Procedure: APPLICATION OF WOUND VAC;  Surgeon: Cherre Robins, MD;  Location: [facility];  Service: Vascular;  Laterality: Left;   BOWEL DECOMPRESSION N/A 01/19/2020  Procedure: BOWEL DECOMPRESSION;  Surgeon: Otis Brace, MD;  Location: [facility];  Service: Gastroenterology;  Laterality: N/A;   BOWEL DECOMPRESSION N/A 11/19/2020   Procedure: BOWEL DECOMPRESSION;  Surgeon: Wilford Corner, MD;  Location: [facility];  Service: Endoscopy;  Laterality: N/A;   BOWEL DECOMPRESSION N/A 12/03/2020   Procedure: BOWEL  DECOMPRESSION;  Surgeon: Ronnette Juniper, MD;  Location: [facility];  Service: Gastroenterology;  Laterality: N/A;   COLONOSCOPY WITH PROPOFOL N/A 01/19/2020   Procedure: COLONOSCOPY WITH PROPOFOL;  Surgeon: Otis Brace, MD;  Location: [facility];  Service: Gastroenterology;  Laterality: N/A;   COLONOSCOPY WITH PROPOFOL N/A 11/19/2020   Procedure: COLONOSCOPY WITH PROPOFOL;  Surgeon: Wilford Corner, MD;  [facility];  Service: Endoscopy;  Laterality: N/A;   COLONOSCOPY WITH PROPOFOL N/A 12/03/2020   Procedure: COLONOSCOPY WITH PROPOFOL;  Surgeon: Ronnette Juniper, MD;  [facility];  Service: Gastroenterology;  Laterality: N/A;   KNEE SURGERY     b/l knees; pending total knee    RADIOLOGY WITH ANESTHESIA N/A 07/04/2020   Procedure: MRI WITH ANESTHESIA BRAIN WITHOUT CONTRAST AND HEAD WITHOUT CONTRAST;  Surgeon: Radiologist, Medication, MD;  Location: [facility];  Service: Radiology;  Laterality: N/A;   TRANSMETATARSAL AMPUTATION Left 03/01/2021   Procedure: TRANSMETATARSAL AMPUTATION;  Surgeon: Trula Slade, DPM;  Location: [facility];  Service: Podiatry;  Laterality: Left;     reports that he has quit smoking. He has never used smokeless tobacco. He reports that he does not currently use drugs after having used the following drugs: Cocaine. He reports that he does not drink alcohol.  Allergies  Allergen Reactions   Other Anaphylaxis, Itching, Swelling and Other (See Comments)    Grits - swelling    Family History  Problem Relation Age of Onset   Diabetes Other    Cancer Mother        uterus; deceased    Heart attack Mother 46   Hypertension Other    Heart attack Father 59   Stroke Sister        12/2012 age 67 y.o    Prior to Admission medications   Medication Sig Start Date End Date Taking? Authorizing Provider  albuterol (VENTOLIN HFA) 108 (90 Base) MCG/ACT inhaler Inhale 2 puffs into the lungs every 6 (six) hours as needed for wheezing or shortness of breath.     [provider]  amLODipine (NORVASC) 10 MG tablet Take 1 tablet (10 mg total) by mouth daily. 12/11/21   [place] Donnamarie Poag, DO  ascorbic acid (VITAMIN C) 500 MG tablet Take 500 mg by mouth 2 (two) times daily.    [provider]  aspirin 81 MG chewable tablet Chew 81 mg by mouth daily.    [provider]  atorvastatin (LIPITOR) 80 MG tablet Take 80 mg by mouth at bedtime.    [provider]  b complex vitamins capsule Take 1 capsule by mouth daily.    [provider]  brimonidine (ALPHAGAN) 0.2 % ophthalmic solution Place 1 drop into both eyes 2 (two) times daily.    [provider]  cloNIDine (CATAPRES) 0.1 MG tablet Take 0.1 mg by mouth 2 (two) times daily.    [provider]  collagenase (SANTYL) 250 UNIT/GM ointment Apply 1 Application topically daily. To left stump    [provider]  docusate sodium (COLACE) 100 MG capsule Take 100 mg by mouth at bedtime.    [provider]  dorzolamide (TRUSOPT) 2 % ophthalmic solution Place 1 drop into  both eyes 3 (three) times daily.    [provider]  ferrous sulfate 325 (65 FE) MG tablet Take 1 tablet (325 mg total) by mouth daily. 09/23/20   Raiford Noble Latif, DO  furosemide (LASIX) 80 MG tablet Take 1 tablet (80 mg total) by mouth 2 (two) times daily. 10/31/21   Erskine Emery, MD  hydrALAZINE (APRESOLINE) 25 MG tablet Take 25 mg by mouth 3 (three) times daily.    [provider]  insulin aspart (NOVOLOG FLEXPEN) 100 UNIT/ML FlexPen Inject 2-10 Units into the skin 3 (three) times daily with meals. Inject per sliding scale    [provider]  Insulin Glargine (BASAGLAR KWIKPEN) 100 UNIT/ML Inject 15 Units into the skin daily.    [provider]  latanoprost (XALATAN) 0.005 % ophthalmic solution Place 1 drop into both eyes at bedtime.    [provider]  Magnesium Oxide 400 MG CAPS Take 1 capsule (400 mg total) by mouth daily.  11/01/21   Erskine Emery, MD  Multiple Vitamin (MULTIVITAMIN) tablet Take 1 tablet by mouth 2 (two) times daily.    [provider]  Nutritional Supplements (FEEDING SUPPLEMENT, NEPRO CARB STEADY,) LIQD Take 237 mLs by mouth at bedtime. 10/31/21   Erskine Emery, MD  pantoprazole (PROTONIX) 40 MG tablet Take 1 tablet (40 mg total) by mouth daily. 10/31/21   Erskine Emery, MD  potassium chloride SA (KLOR-CON M) 20 MEQ tablet Take 1 tablet (20 mEq total) by mouth daily. 12/11/21   [place] Chadley Dziedzic J, DO  pregabalin (LYRICA) 75 MG capsule Take 1 capsule (75 mg total) by mouth daily. 10/31/21   Erskine Emery, MD  PROTEIN PO Take 30 mLs by mouth 2 (two) times daily.    [provider]  saccharomyces boulardii (FLORASTOR) 250 MG capsule Take 1 capsule (250 mg total) by mouth 2 (two) times daily. 09/07/20   Antonieta Pert, MD  sertraline (ZOLOFT) 50 MG tablet Take 50 mg by mouth every evening.    [provider]  sodium bicarbonate 650 MG tablet Take 2 tablets (1,300 mg total) by mouth 2 (two) times daily. 10/31/21   Erskine Emery, MD  tamsulosin (FLOMAX) 0.4 MG CAPS capsule Take 0.8 mg by mouth every evening.    [provider]  Vitamin D, Ergocalciferol, (DRISDOL) 1.25 MG (50000 UNIT) CAPS capsule Take 50,000 Units by mouth every 30 (thirty) days. 17th of each month    [provider]  zinc sulfate 220 (50 Zn) MG capsule Take 220 mg by mouth daily.    [provider]    Physical Exam: Vitals:   03/17/22 1830 03/17/22 1900 03/17/22 1944 03/17/22 2030  BP: (!) 159/87 (!) 160/96  (!) 159/98  Pulse: 86 85  81  Resp: '18 20  18  '$ Temp: 98.1 F (36.7 C)  97.6 F (36.4 C)   TempSrc:   Oral   SpO2: 100% 100%  100%    Physical Exam Vitals reviewed.  Constitutional:      General: He is not in acute distress.    Appearance: He is obese. He is not toxic-appearing or diaphoretic.     Comments: Chronically ill appearing  HENT:     Head: Normocephalic and  atraumatic.     Nose: Nose normal.  Cardiovascular:     Rate and Rhythm: Normal rate and regular rhythm.     Pulses: Normal pulses.     Comments: +5 cm JVD Pulmonary:     Breath sounds: Examination of the right-upper  field reveals decreased breath sounds. Examination of the left-upper field reveals decreased breath sounds. Examination of the right-lower field reveals rales. Examination of the left-lower field reveals rales. Decreased breath sounds and rales present.  Abdominal:     General: Abdomen is protuberant. There is no distension.     Tenderness: There is no abdominal tenderness. There is no guarding or rebound.  Musculoskeletal:     Comments: +2 pitting bilateral flank edema +2 pitting bilateral thigh edema +2 pitting right pretibial edema +2 pitting right ankle/pedal edema  + left BKA  Skin:    General: Skin is warm and dry.     Capillary Refill: Capillary refill takes less than 2 seconds.  Neurological:     Mental Status: He is alert and oriented to person, place, and time.     Comments: Right side  hemiplegia UE/LE      Labs on Admission: I have personally reviewed following labs and imaging studies  CBC: Recent Labs  Lab 03/17/22 1440  WBC 6.3  NEUTROABS 4.9  HGB 8.1*  HCT 26.6*  MCV 83.9  PLT 093   Basic Metabolic Panel: Recent Labs  Lab 03/17/22 1440  NA 134*  K 4.3  CL 112*  CO2 17*  GLUCOSE 171*  BUN 57*  CREATININE 3.22*  CALCIUM 7.8*   GFR: CrCl cannot be calculated (Unknown ideal weight.). Liver Function Tests: Recent Labs  Lab 03/17/22 1440  AST 16  ALT 18  ALKPHOS 115  BILITOT 0.5  PROT 7.0  ALBUMIN 2.7*   No results for input(s): "LIPASE", "AMYLASE" in the last 168 hours. No results for input(s): "AMMONIA" in the last 168 hours. Coagulation Profile: No results for input(s): "INR", "PROTIME" in the last 168 hours. Cardiac Enzymes: Recent Labs  Lab 03/17/22 1440 03/17/22 1640  TROPONINIHS 25* 38*   BNP (last 3  results) No results for input(s): "PROBNP" in the last 8760 hours. HbA1C: No results for input(s): "HGBA1C" in the last 72 hours. CBG: No results for input(s): "GLUCAP" in the last 168 hours. Lipid Profile: No results for input(s): "CHOL", "HDL", "LDLCALC", "TRIG", "CHOLHDL", "LDLDIRECT" in the last 72 hours. Thyroid Function Tests: No results for input(s): "TSH", "T4TOTAL", "FREET4", "T3FREE", "THYROIDAB" in the last 72 hours. Anemia Panel: No results for input(s): "VITAMINB12", "FOLATE", "FERRITIN", "TIBC", "IRON", "RETICCTPCT" in the last 72 hours. Urine analysis:    Component Value Date/Time   COLORURINE YELLOW 10/19/2021 1840   APPEARANCEUR CLOUDY (A) 10/19/2021 1840   APPEARANCEUR Clear 12/31/2013 2116   LABSPEC 1.012 10/19/2021 1840   LABSPEC 1.032 12/31/2013 2116   PHURINE 5.0 10/19/2021 1840   GLUCOSEU 50 (A) 10/19/2021 1840   GLUCOSEU >=500 12/31/2013 2116   HGBUR NEGATIVE 10/19/2021 1840   BILIRUBINUR NEGATIVE 10/19/2021 1840   BILIRUBINUR Negative 12/31/2013 2116   KETONESUR NEGATIVE 10/19/2021 1840   PROTEINUR 100 (A) 10/19/2021 1840   UROBILINOGEN 1.0 08/28/2013 1709   NITRITE NEGATIVE 10/19/2021 1840   LEUKOCYTESUR NEGATIVE 10/19/2021 1840   LEUKOCYTESUR Negative 12/31/2013 2116    Radiological Exams on Admission: I have personally reviewed images CT Chest Wo Contrast  Result Date: 03/17/2022 CLINICAL DATA:  Pneumonia. EXAM: CT CHEST WITHOUT CONTRAST TECHNIQUE: Multidetector CT imaging of the chest was performed following the standard protocol without IV contrast. RADIATION DOSE REDUCTION: This exam was performed according to the departmental dose-optimization program which includes automated exposure control, adjustment of the mA and/or kV according to patient size and/or use of iterative reconstruction technique. COMPARISON:  Chest x-ray same day.  CT angiogram chest 10/19/2021. FINDINGS: Cardiovascular: Heart is mildly enlarged. There is a small pericardial  effusion. Aorta is normal in size. There are atherosclerotic calcifications of the aorta. Mediastinum/Nodes: Multiple thyroid nodules are present bilaterally measuring up to 2.2 cm on the left image 2/17, similar to prior. No definite enlarged mediastinal, hilar or axillary lymph nodes allowing for lack of intravenous contrast. The esophagus is nondilated. Lungs/Pleura: There are diffuse patchy ground-glass opacities throughout both lungs most significant in the bilateral upper lobes. Additional upper lobe airspace opacities are present. There is some smooth interlobular septal thickening predominantly in the upper lobes. There are trace bilateral pleural effusions. There is a 6 mm nodular density in the left upper lobe image 7/71, new from prior. No pneumothorax. Trachea and central airways patent as can be seen. Upper Abdomen: Gallstone present. Musculoskeletal: No acute fractures are seen. There is bilateral gynecomastia and mild body wall edema. IMPRESSION: 1. Diffuse patchy ground-glass opacities throughout both lungs most significant in the bilateral upper lobes. There is some smooth interlobular septal thickening in the upper lobes. Findings are favored as infectious/inflammatory process. 2. Trace bilateral pleural effusions. 3. 6 mm left solid pulmonary nodule within the upper lobe. Per Fleischner Society Guidelines, recommend a non-contrast Chest CT at 6-12 months. If patient is high risk for malignancy, consider an additional non-contrast Chest CT at 18-24 months. If patient is low risk for malignancy, non-contrast Chest CT at 18-24 months is optional. These guidelines do not apply to immunocompromised patients and patients with cancer. Follow up in patients with significant comorbidities as clinically warranted. For lung cancer screening, adhere to Lung-RADS guidelines. Reference: Radiology. 2017; 284(1):228-43. 4. Mild cardiomegaly with small pericardial effusion. 5. Body wall edema. 6. Cholelithiasis.  7. Thyroid nodules measuring up to 2.2 cm. Follow-up ultrasound recommended. Aortic Atherosclerosis (ICD10-I70.0). Electronically Signed   By: Ronney Asters M.D.   On: 03/17/2022 16:26   DG Chest Portable 1 View  Result Date: 03/17/2022 CLINICAL DATA:  Shortness of breath. EXAM: PORTABLE CHEST 1 VIEW COMPARISON:  12/08/2021 FINDINGS: 1459 hours. Low volume film. The cardio pericardial silhouette is enlarged. Diffuse hazy bilateral airspace opacity noted with some sparing at the right base. The visualized bony structures of the thorax are unremarkable. Telemetry leads overlie the chest. IMPRESSION: Low volume film with diffuse hazy bilateral airspace opacity. Imaging features suggest pulmonary edema although diffuse infection not excluded. Electronically Signed   By: Misty Stanley M.D.   On: 03/17/2022 15:09    EKG: My personal interpretation of EKG shows: sinus tachycardia    Assessment/Plan Principal Problem:   Acute on chronic diastolic CHF (congestive heart failure) (HCC) Active Problems:   Acute respiratory failure with hypoxia (HCC)   Type 2 diabetes mellitus with stage 4 chronic kidney disease and hypertension (HCC)   Morbid (severe) obesity due to excess calories (HCC)   CKD (chronic kidney disease) stage 4, GFR 15-29 ml/min (HCC)   CVA (HCC) with residual right-sided weakness   History of left below knee amputation ([place])   Essential hypertension    Assessment and Plan: * Acute on chronic diastolic CHF (congestive heart failure) ([place]) Admit to med/tele bed. No leukocytosis. No fevers. Pt with JVD. When he was admitted to hospital in 09-2021, he was discharged to SNF on lasix 80 mg bid. Currently only on 40 mg lasix daily. I do not think he has pneumoina. I think his SOB, respiratory failure with hypoxia is due to volume overload from acute CHF. Pt with difficulty with diuresis  in 09-2021 admission. He required augmented diuresis with IV albumin and metolazone. Will give 25 g IV albumin  and 80 mg IV lasix and monitor his urine output. Pt had been seen by both nephrology and palliative care during 09-2021 admission. He may need nephrology consult again. He is a poor dialysis candidate in my opinion.  Acute respiratory failure with hypoxia (HCC) Likely due to pulmonary edema. Check procalcitonin. Doubt his respiratory failure is from pneumonia. He did receive 1 dose of Rocephin/zithromax in ER.  If SOB does not improve with diuresis, can consider restarting abx tomorrow.  History of left below knee amputation (HCC) Chronic.  CVA ([place]) with residual right-sided weakness Chronic.  CKD (chronic kidney disease) stage 4, GFR 15-29 ml/min (HCC) Acutely worse. Likely due to acute on chronic CHF. Baseline Scr 2.6-3.0.  hopefully diuresis with improve renal perfusion. Not a candidate for ARB/ACEI due to CKD stage 4  Morbid (severe) obesity due to excess calories (HCC) Chronic. Pt is bedbound. Will need accurate weight. But his BMI in >35  Type 2 diabetes mellitus with stage 4 chronic kidney disease and hypertension ([place]) Continue with SSI and lantus. Check A1c.  Essential hypertension Continue with hydralazine for afterload reduction. Hold clonidine. Unclear why he is not on betablocker.   DVT prophylaxis: SQ Heparin Code Status: Full Code Family Communication: no family at bedside  Disposition Plan: return to SNF  Consults called: none  Admission status: Observation, Telemetry bed   Kristopher Oppenheim, DO [facility] 03/17/2022, 9:25 PM

## 2022-03-17 NOTE — Assessment & Plan Note (Signed)
Continue with SSI and lantus. Check A1c.

## 2022-03-17 NOTE — Progress Notes (Signed)
Rt left bipap on standby in room. Pt is on 3L o2 sats are 98%. Mild work of breathing. Lungs have crackles. Pt speaking in sentences.

## 2022-03-17 NOTE — Assessment & Plan Note (Signed)
Acutely worse. Likely due to acute on chronic CHF. Baseline Scr 2.6-3.0.  hopefully diuresis with improve renal perfusion. Not a candidate for ARB/ACEI due to CKD stage 4

## 2022-03-17 NOTE — Discharge Instructions (Signed)
6 mm left solid pulmonary nodule within the upper lobe. Per  Fleischner Society Guidelines, recommend a non-contrast Chest CT at  6-12 months. If patient is high risk for malignancy, consider an  additional non-contrast Chest CT at 18-24 months. If patient is low  risk for malignancy, non-contrast Chest CT at 18-24 months is  optional.  These guidelines do not apply to immunocompromised patients and  patients with cancer. Follow up in patients with significant  comorbidities as clinically warranted. For lung cancer screening,  adhere to Lung-RADS guidelines. Reference: Radiology. 2017;  284(1):228-43.

## 2022-03-17 NOTE — Assessment & Plan Note (Signed)
Chronic. Pt is bedbound. Will need accurate weight. But his BMI in >35

## 2022-03-17 NOTE — Assessment & Plan Note (Signed)
Chronic. 

## 2022-03-17 NOTE — Assessment & Plan Note (Signed)
Admit to med/tele bed. No leukocytosis. No fevers. Pt with JVD. When he was admitted to hospital in 09-2021, he was discharged to SNF on lasix 80 mg bid. Currently only on 40 mg lasix daily. I do not think he has pneumoina. I think his SOB, respiratory failure with hypoxia is due to volume overload from acute CHF. Pt with difficulty with diuresis in 09-2021 admission. He required augmented diuresis with IV albumin and metolazone. Will give 25 g IV albumin and 80 mg IV lasix and monitor his urine output. Pt had been seen by both nephrology and palliative care during 09-2021 admission. He may need nephrology consult again. He is a poor dialysis candidate in my opinion.

## 2022-03-17 NOTE — ED Triage Notes (Signed)
EMS reports from [facility], called out for shortness of breath since yesterday, worsened acutely x 1 hour, reported rails all fields. Hx of CHF. Also noted increased swelling right arm and leg. Arrived on C-pap with improvement.  BP 168/94 HR 102 RR 24 Sp02 88 RA at facility.   0.4 Nitro sublingual enroute.  18 ga L forearm

## 2022-03-17 NOTE — Assessment & Plan Note (Signed)
Likely due to pulmonary edema. Check procalcitonin. Doubt his respiratory failure is from pneumonia. He did receive 1 dose of Rocephin/zithromax in ER.  If SOB does not improve with diuresis, can consider restarting abx tomorrow.

## 2022-03-17 NOTE — Subjective & Objective (Signed)
CC: SOB, wheezing HPI: 62 year old African-American male lives at [facility] with a history of chronic diastolic heart failure EF of 60%, CKD stage IV baseline creatinine 2.6-3.0, type 2 diabetes on insulin, peripheral vascular disease status post left below-knee amputation, history of CVA with residual right-sided hemiparesis, hypertension, BPH who presents to the ER today with shortness of breath and hypoxia.  Apparently EMS was called out to [place] due to shortness of breath.  He was noted to be in respiratory distress and started on CPAP and oxygen at the facility when EMS arrived.  O2 saturations were 88%.  On arrival to the ER, temp 98.1 heart rate 104 blood pressure 164/98.  Labs showed a BNP of 370  COVID-negative, influenza negative, RSV negative  Sodium 134, bicarb 17, BUN of 58, creatinine 3.2 albumin 2.7  White count 6.3, hemoglobin 8.1, platelets of 174  CT chest demonstrated diffuse groundglass opacities throughout both lungs mostly in the upper lobes.  Trace bilateral pleural effusions.  Body wall edema.  [facility] hospitalist contacted for admission.

## 2022-03-17 NOTE — Assessment & Plan Note (Signed)
Continue with hydralazine for afterload reduction. Hold clonidine. Unclear why he is not on betablocker.

## 2022-03-17 NOTE — ED Provider Notes (Signed)
[facility] Provider Note   CSN: [id number] Arrival date & time: 03/17/22  1434     History Chief Complaint  Patient presents with   Shortness of Breath    HPI [name] is a 62 y.o. male presenting for chief complaint of shortness of breath.  He is a 62 year old male with extensive medical history lives at a skilled nursing facility. Polysubstance abuse, diabetes, hypertension, heart failure, recurrent pneumonia, heart failure exacerbations..   Patient's recorded medical, surgical, social, medication list and allergies were reviewed in the Snapshot window as part of the initial history.   Review of Systems   Review of Systems  Constitutional:  Negative for chills and fever.  HENT:  Negative for ear pain and sore throat.   Eyes:  Negative for pain and visual disturbance.  Respiratory:  Positive for shortness of breath. Negative for cough.   Cardiovascular:  Negative for chest pain and palpitations.  Gastrointestinal:  Negative for abdominal pain and vomiting.  Genitourinary:  Negative for dysuria and hematuria.  Musculoskeletal:  Negative for arthralgias and back pain.  Skin:  Negative for color change and rash.  Neurological:  Negative for seizures and syncope.  All other systems reviewed and are negative.   Physical Exam Updated Vital Signs BP (!) 159/98   Pulse 81   Temp 97.6 F (36.4 C) (Oral)   Resp 18   SpO2 100%  Physical Exam Vitals and nursing note reviewed.  Constitutional:      General: He is not in acute distress.    Appearance: He is well-developed.  HENT:     Head: Normocephalic and atraumatic.  Eyes:     Conjunctiva/sclera: Conjunctivae normal.  Cardiovascular:     Rate and Rhythm: Normal rate and regular rhythm.     Heart sounds: No murmur heard. Pulmonary:     Effort: Respiratory distress present.     Breath sounds: Rhonchi present.  Abdominal:     Palpations: Abdomen is soft.      Tenderness: There is no abdominal tenderness.  Musculoskeletal:        General: No swelling.     Cervical back: Neck supple.  Skin:    General: Skin is warm and dry.     Capillary Refill: Capillary refill takes less than 2 seconds.  Neurological:     Mental Status: He is alert.  Psychiatric:        Mood and Affect: Mood normal.      ED Course/ Medical Decision Making/ A&P    Procedures .Critical Care  Performed by: Tretha Sciara, MD Authorized by: Tretha Sciara, MD   Critical care provider statement:    Critical care time (minutes):  30   Critical care was necessary to treat or prevent imminent or life-threatening deterioration of the following conditions:  Respiratory failure and sepsis   Critical care was time spent personally by me on the following activities:  Development of treatment plan with patient or surrogate, discussions with consultants, evaluation of patient's response to treatment, examination of patient, ordering and review of laboratory studies, ordering and review of radiographic studies, ordering and performing treatments and interventions, pulse oximetry, re-evaluation of patient's condition and review of old charts   Care discussed with: admitting provider      Medications Ordered in ED Medications  cefTRIAXone (ROCEPHIN) 1 g in sodium chloride 0.9 % 100 mL IVPB (0 g Intravenous Stopped 03/17/22 2129)  azithromycin (ZITHROMAX) 500 mg in sodium chloride 0.9 %  250 mL IVPB (500 mg Intravenous New Bag/Given 03/17/22 2111)  furosemide (LASIX) injection 80 mg (80 mg Intravenous Given 03/17/22 2114)  albumin human 25 % solution 25 g (25 g Intravenous New Bag/Given 03/17/22 2207)    Medical Decision Making:    [name] is a 62 y.o. male who presented to the ED today with shortness of breath detailed above.     Patient's presentation is complicated by their history of multiple comorbid medical conditions.  Patient placed on continuous vitals and  telemetry monitoring while in ED which was reviewed periodically.   Complete initial physical exam performed, notably the patient  was hemodynamically stable in no acute distress.  He is grossly tachypneic and dyspneic requiring 3 L nasal cannula to maintain oxygen saturations.      Reviewed and confirmed nursing documentation for past medical history, family history, social history.    Initial Assessment:   With the patient's presentation of shortness of breath, most likely diagnosis is pneumonia versus heart failure exacerbation given history of similar. Other This is most consistent with an acute life/limb threatening illness complicated by underlying chronic conditions.  Initial Plan:  Screening labs including CBC and Metabolic panel to evaluate for infectious or metabolic etiology of disease.  Urinalysis with reflex culture ordered to evaluate for UTI or relevant urologic/nephrologic pathology.  CXR to evaluate for structural/infectious intrathoracic pathology.  Ultimately nondiagnostic.  Proceeded to CT chest without contrast for further diagnostic evaluation Troponin/BNP/EKG to evaluate for cardiac pathology. Objective evaluation as below reviewed with plan for close reassessment  Initial Study Results:   Laboratory  Multifocal abnormalities including troponin elevation, AKI, anemia, BNP elevation  EKG EKG was reviewed independently. Rate, rhythm, axis, intervals all examined and without medically relevant abnormality. ST segments without concerns for elevations.    Radiology  All images reviewed independently. Agree with radiology report at this time.   CT Chest Wo Contrast  Result Date: 03/17/2022 CLINICAL DATA:  Pneumonia. EXAM: CT CHEST WITHOUT CONTRAST TECHNIQUE: Multidetector CT imaging of the chest was performed following the standard protocol without IV contrast. RADIATION DOSE REDUCTION: This exam was performed according to the departmental dose-optimization program which  includes automated exposure control, adjustment of the mA and/or kV according to patient size and/or use of iterative reconstruction technique. COMPARISON:  Chest x-ray same day.  CT angiogram chest 10/19/2021. FINDINGS: Cardiovascular: Heart is mildly enlarged. There is a small pericardial effusion. Aorta is normal in size. There are atherosclerotic calcifications of the aorta. Mediastinum/Nodes: Multiple thyroid nodules are present bilaterally measuring up to 2.2 cm on the left image 2/17, similar to prior. No definite enlarged mediastinal, hilar or axillary lymph nodes allowing for lack of intravenous contrast. The esophagus is nondilated. Lungs/Pleura: There are diffuse patchy ground-glass opacities throughout both lungs most significant in the bilateral upper lobes. Additional upper lobe airspace opacities are present. There is some smooth interlobular septal thickening predominantly in the upper lobes. There are trace bilateral pleural effusions. There is a 6 mm nodular density in the left upper lobe image 7/71, new from prior. No pneumothorax. Trachea and central airways patent as can be seen. Upper Abdomen: Gallstone present. Musculoskeletal: No acute fractures are seen. There is bilateral gynecomastia and mild body wall edema. IMPRESSION: 1. Diffuse patchy ground-glass opacities throughout both lungs most significant in the bilateral upper lobes. There is some smooth interlobular septal thickening in the upper lobes. Findings are favored as infectious/inflammatory process. 2. Trace bilateral pleural effusions. 3. 6 mm left solid pulmonary  nodule within the upper lobe. Per Fleischner Society Guidelines, recommend a non-contrast Chest CT at 6-12 months. If patient is high risk for malignancy, consider an additional non-contrast Chest CT at 18-24 months. If patient is low risk for malignancy, non-contrast Chest CT at 18-24 months is optional. These guidelines do not apply to immunocompromised patients and  patients with cancer. Follow up in patients with significant comorbidities as clinically warranted. For lung cancer screening, adhere to Lung-RADS guidelines. Reference: Radiology. 2017; 284(1):228-43. 4. Mild cardiomegaly with small pericardial effusion. 5. Body wall edema. 6. Cholelithiasis. 7. Thyroid nodules measuring up to 2.2 cm. Follow-up ultrasound recommended. Aortic Atherosclerosis (ICD10-I70.0). Electronically Signed   By: Ronney Asters M.D.   On: 03/17/2022 16:26   DG Chest Portable 1 View  Result Date: 03/17/2022 CLINICAL DATA:  Shortness of breath. EXAM: PORTABLE CHEST 1 VIEW COMPARISON:  12/08/2021 FINDINGS: 1459 hours. Low volume film. The cardio pericardial silhouette is enlarged. Diffuse hazy bilateral airspace opacity noted with some sparing at the right base. The visualized bony structures of the thorax are unremarkable. Telemetry leads overlie the chest. IMPRESSION: Low volume film with diffuse hazy bilateral airspace opacity. Imaging features suggest pulmonary edema although diffuse infection not excluded. Electronically Signed   By: Misty Stanley M.D.   On: 03/17/2022 15:09     Consults:  Case discussed with hospitalist.   Final Assessment and Plan:   Patient's history of present on his physicals and findings are more consistent with pneumonia rather than heart failure exacerbation at this time though clinical overlap remains.  Will trial antibiotics to start and with plan for reevaluation for need for diuresis.  Discussed case with hospitalist who agreed with need for admission.  Patient admitted with no further acute events. Notably, sepsis fluids were held due to patient's history of heart failure.   Disposition:  I have considered need for hospitalization, however, considering all of the above, I believe this patient is stable for discharge at this time.  Patient/family educated about specific return precautions for given chief complaint and symptoms.  Patient/family  educated about follow-up with PCP.   Patient/family expressed understanding of return precautions and need for follow-up. Patient spoken to regarding all imaging and laboratory results and appropriate follow up for these results. All education provided in verbal form with additional information in written form. Time was allowed for answering of patient questions. Patient discharged.    Emergency Department Medication Summary:   Medications  cefTRIAXone (ROCEPHIN) 1 g in sodium chloride 0.9 % 100 mL IVPB (0 g Intravenous Stopped 03/17/22 2129)  azithromycin (ZITHROMAX) 500 mg in sodium chloride 0.9 % 250 mL IVPB (500 mg Intravenous New Bag/Given 03/17/22 2111)  furosemide (LASIX) injection 80 mg (80 mg Intravenous Given 03/17/22 2114)  albumin human 25 % solution 25 g (25 g Intravenous New Bag/Given 03/17/22 2207)         Clinical Impression:  1. SOB (shortness of breath)   2. Community acquired pneumonia, unspecified laterality      Admit   Final Clinical Impression(s) / ED Diagnoses Final diagnoses:  SOB (shortness of breath)  Community acquired pneumonia, unspecified laterality    Rx / DC Orders ED Discharge Orders     None         Tretha Sciara, MD 03/17/22 2255

## 2022-03-18 ENCOUNTER — Observation Stay (HOSPITAL_COMMUNITY)
Admit: 2022-03-18 | Discharge: 2022-03-18 | Disposition: A | Discharge status: Home / Self Care | Payer: Medicare (Managed Care) | Attending: Family Medicine | Admitting: Family Medicine

## 2022-03-18 ENCOUNTER — Observation Stay (HOSPITAL_COMMUNITY): Payer: Medicare (Managed Care)

## 2022-03-18 DIAGNOSIS — I5031 Acute diastolic (congestive) heart failure: Secondary | ICD-10-CM

## 2022-03-18 DIAGNOSIS — E872 Acidosis, unspecified: Secondary | ICD-10-CM | POA: Diagnosis present

## 2022-03-18 DIAGNOSIS — R0602 Shortness of breath: Secondary | ICD-10-CM | POA: Diagnosis present

## 2022-03-18 DIAGNOSIS — E042 Nontoxic multinodular goiter: Secondary | ICD-10-CM | POA: Diagnosis present

## 2022-03-18 DIAGNOSIS — M7989 Other specified soft tissue disorders: Secondary | ICD-10-CM

## 2022-03-18 DIAGNOSIS — Z8673 Personal history of transient ischemic attack (TIA), and cerebral infarction without residual deficits: Secondary | ICD-10-CM | POA: Diagnosis not present

## 2022-03-18 DIAGNOSIS — N184 Chronic kidney disease, stage 4 (severe): Secondary | ICD-10-CM | POA: Diagnosis present

## 2022-03-18 DIAGNOSIS — D631 Anemia in chronic kidney disease: Secondary | ICD-10-CM | POA: Diagnosis present

## 2022-03-18 DIAGNOSIS — L89159 Pressure ulcer of sacral region, unspecified stage: Secondary | ICD-10-CM | POA: Diagnosis present

## 2022-03-18 DIAGNOSIS — Z8249 Family history of ischemic heart disease and other diseases of the circulatory system: Secondary | ICD-10-CM | POA: Diagnosis not present

## 2022-03-18 DIAGNOSIS — J189 Pneumonia, unspecified organism: Secondary | ICD-10-CM | POA: Diagnosis not present

## 2022-03-18 DIAGNOSIS — E1122 Type 2 diabetes mellitus with diabetic chronic kidney disease: Secondary | ICD-10-CM | POA: Diagnosis present

## 2022-03-18 DIAGNOSIS — Z794 Long term (current) use of insulin: Secondary | ICD-10-CM | POA: Diagnosis not present

## 2022-03-18 DIAGNOSIS — Z66 Do not resuscitate: Secondary | ICD-10-CM | POA: Diagnosis present

## 2022-03-18 DIAGNOSIS — F319 Bipolar disorder, unspecified: Secondary | ICD-10-CM | POA: Diagnosis present

## 2022-03-18 DIAGNOSIS — J9601 Acute respiratory failure with hypoxia: Secondary | ICD-10-CM | POA: Diagnosis present

## 2022-03-18 DIAGNOSIS — I130 Hypertensive heart and chronic kidney disease with heart failure and stage 1 through stage 4 chronic kidney disease, or unspecified chronic kidney disease: Secondary | ICD-10-CM | POA: Diagnosis present

## 2022-03-18 DIAGNOSIS — Z79899 Other long term (current) drug therapy: Secondary | ICD-10-CM | POA: Diagnosis not present

## 2022-03-18 DIAGNOSIS — Z515 Encounter for palliative care: Secondary | ICD-10-CM | POA: Diagnosis not present

## 2022-03-18 DIAGNOSIS — N179 Acute kidney failure, unspecified: Secondary | ICD-10-CM | POA: Diagnosis present

## 2022-03-18 DIAGNOSIS — E1151 Type 2 diabetes mellitus with diabetic peripheral angiopathy without gangrene: Secondary | ICD-10-CM | POA: Diagnosis present

## 2022-03-18 DIAGNOSIS — K767 Hepatorenal syndrome: Secondary | ICD-10-CM | POA: Diagnosis present

## 2022-03-18 DIAGNOSIS — Z7189 Other specified counseling: Secondary | ICD-10-CM | POA: Diagnosis not present

## 2022-03-18 DIAGNOSIS — E876 Hypokalemia: Secondary | ICD-10-CM | POA: Diagnosis present

## 2022-03-18 DIAGNOSIS — R338 Other retention of urine: Secondary | ICD-10-CM | POA: Diagnosis present

## 2022-03-18 DIAGNOSIS — Z89512 Acquired absence of left leg below knee: Secondary | ICD-10-CM | POA: Diagnosis not present

## 2022-03-18 DIAGNOSIS — I5033 Acute on chronic diastolic (congestive) heart failure: Secondary | ICD-10-CM | POA: Diagnosis present

## 2022-03-18 DIAGNOSIS — D72819 Decreased white blood cell count, unspecified: Secondary | ICD-10-CM | POA: Diagnosis present

## 2022-03-18 DIAGNOSIS — Z1152 Encounter for screening for COVID-19: Secondary | ICD-10-CM | POA: Diagnosis not present

## 2022-03-18 DIAGNOSIS — I69351 Hemiplegia and hemiparesis following cerebral infarction affecting right dominant side: Secondary | ICD-10-CM | POA: Diagnosis not present

## 2022-03-18 LAB — CBC WITH DIFFERENTIAL/PLATELET
Abs Immature Granulocytes: 0.02 10*3/uL (ref 0.00–0.07)
Basophils Absolute: 0 10*3/uL (ref 0.0–0.1)
Basophils Relative: 1 %
Eosinophils Absolute: 0.2 10*3/uL (ref 0.0–0.5)
Eosinophils Relative: 3 %
HCT: 24.8 % — ABNORMAL LOW (ref 39.0–52.0)
Hemoglobin: 7.4 g/dL — ABNORMAL LOW (ref 13.0–17.0)
Immature Granulocytes: 0 %
Lymphocytes Relative: 23 %
Lymphs Abs: 1.2 10*3/uL (ref 0.7–4.0)
MCH: 25.1 pg — ABNORMAL LOW (ref 26.0–34.0)
MCHC: 29.8 g/dL — ABNORMAL LOW (ref 30.0–36.0)
MCV: 84.1 fL (ref 80.0–100.0)
Monocytes Absolute: 0.4 10*3/uL (ref 0.1–1.0)
Monocytes Relative: 8 %
Neutro Abs: 3.3 10*3/uL (ref 1.7–7.7)
Neutrophils Relative %: 65 %
Platelets: 162 10*3/uL (ref 150–400)
RBC: 2.95 MIL/uL — ABNORMAL LOW (ref 4.22–5.81)
RDW: 16.9 % — ABNORMAL HIGH (ref 11.5–15.5)
WBC: 5.2 10*3/uL (ref 4.0–10.5)
nRBC: 0 % (ref 0.0–0.2)

## 2022-03-18 LAB — COMPREHENSIVE METABOLIC PANEL
ALT: 15 U/L (ref 0–44)
AST: 10 U/L — ABNORMAL LOW (ref 15–41)
Albumin: 2.5 g/dL — ABNORMAL LOW (ref 3.5–5.0)
Alkaline Phosphatase: 91 U/L (ref 38–126)
Anion gap: 6 (ref 5–15)
BUN: 53 mg/dL — ABNORMAL HIGH (ref 6–20)
CO2: 20 mmol/L — ABNORMAL LOW (ref 22–32)
Calcium: 8 mg/dL — ABNORMAL LOW (ref 8.9–10.3)
Chloride: 113 mmol/L — ABNORMAL HIGH (ref 98–111)
Creatinine, Ser: 3.17 mg/dL — ABNORMAL HIGH (ref 0.61–1.24)
GFR, Estimated: 22 mL/min — ABNORMAL LOW (ref >60)
Glucose, Bld: 104 mg/dL — ABNORMAL HIGH (ref 70–99)
Potassium: 4.1 mmol/L (ref 3.5–5.1)
Sodium: 139 mmol/L (ref 135–145)
Total Bilirubin: 0.4 mg/dL (ref 0.3–1.2)
Total Protein: 6.4 g/dL — ABNORMAL LOW (ref 6.5–8.1)

## 2022-03-18 LAB — ECHOCARDIOGRAM COMPLETE
AR max vel: 1.1 cm2
AV Area VTI: 1.06 cm2
AV Area mean vel: 1.12 cm2
AV Mean grad: 11.3 mmHg
AV Peak grad: 20 mmHg
Ao pk vel: 2.23 m/s
Area-P 1/2: 3.27 cm2
Calc EF: 55.9 %
S' Lateral: 3 cm
Single Plane A2C EF: 61.2 %
Single Plane A4C EF: 54.3 %

## 2022-03-18 LAB — BRAIN NATRIURETIC PEPTIDE: B Natriuretic Peptide: 575.9 pg/mL — ABNORMAL HIGH (ref 0.0–100.0)

## 2022-03-18 LAB — CBG MONITORING, ED
Glucose-Capillary: 93 mg/dL (ref 70–99)
Glucose-Capillary: 128 mg/dL — ABNORMAL HIGH (ref 70–99)

## 2022-03-18 LAB — HIV ANTIBODY (ROUTINE TESTING W REFLEX): HIV Screen 4th Generation wRfx: NONREACTIVE

## 2022-03-18 LAB — HEMOGLOBIN A1C
Hgb A1c MFr Bld: 6.1 % — ABNORMAL HIGH (ref 4.8–5.6)
Mean Plasma Glucose: 128.37 mg/dL

## 2022-03-18 LAB — CREATININE, URINE, RANDOM: Creatinine, Urine: 51 mg/dL

## 2022-03-18 LAB — GLUCOSE, CAPILLARY: Glucose-Capillary: 141 mg/dL — ABNORMAL HIGH (ref 70–99)

## 2022-03-18 LAB — PROCALCITONIN: Procalcitonin: <0.1 ng/mL

## 2022-03-18 LAB — LACTIC ACID, PLASMA: Lactic Acid, Venous: 0.7 mmol/L (ref 0.5–1.9)

## 2022-03-18 MED ORDER — ONDANSETRON HCL 4 MG PO TABS: 4.0000 mg | ORAL_TABLET | Freq: Four times a day (QID) | ORAL | Status: DC | PRN

## 2022-03-18 MED ORDER — ACETAMINOPHEN 650 MG RE SUPP: 650.0000 mg | Freq: Four times a day (QID) | RECTAL | Status: DC | PRN

## 2022-03-18 MED ORDER — ASPIRIN 81 MG PO CHEW
81.0000 mg | CHEWABLE_TABLET | Freq: Every day | ORAL | Status: DC
  Administered 2022-03-18 – 2022-04-02 (×16): 81 mg via ORAL
  Filled 2022-03-18 (×16): qty 1

## 2022-03-18 MED ORDER — FUROSEMIDE 10 MG/ML IJ SOLN
60.0000 mg | Freq: Two times a day (BID) | INTRAMUSCULAR | Status: DC
  Administered 2022-03-18 – 2022-03-19 (×4): 60 mg via INTRAVENOUS
  Filled 2022-03-18: qty 6
  Filled 2022-03-18: qty 8
  Filled 2022-03-18 (×3): qty 6

## 2022-03-18 MED ORDER — ACETAMINOPHEN 325 MG PO TABS
650.0000 mg | ORAL_TABLET | Freq: Four times a day (QID) | ORAL | Status: DC | PRN
  Administered 2022-03-22: 650 mg via ORAL
  Filled 2022-03-18: qty 2

## 2022-03-18 MED ORDER — TAMSULOSIN HCL 0.4 MG PO CAPS
0.8000 mg | ORAL_CAPSULE | Freq: Every evening | ORAL | Status: DC
  Administered 2022-03-18 – 2022-04-01 (×15): 0.8 mg via ORAL
  Filled 2022-03-18 (×15): qty 2

## 2022-03-18 MED ORDER — INSULIN ASPART 100 UNIT/ML IJ SOLN
0.0000 [IU] | Freq: Three times a day (TID) | INTRAMUSCULAR | Status: DC
  Administered 2022-03-19 – 2022-03-21 (×4): 1 [IU] via SUBCUTANEOUS
  Administered 2022-03-21: 2 [IU] via SUBCUTANEOUS
  Administered 2022-03-21: 1 [IU] via SUBCUTANEOUS
  Administered 2022-03-22: 9 [IU] via SUBCUTANEOUS
  Administered 2022-03-22 – 2022-03-23 (×4): 1 [IU] via SUBCUTANEOUS
  Administered 2022-03-24: 2 [IU] via SUBCUTANEOUS
  Administered 2022-03-24 – 2022-03-26 (×5): 1 [IU] via SUBCUTANEOUS
  Administered 2022-03-27: 2 [IU] via SUBCUTANEOUS
  Administered 2022-03-27 – 2022-04-01 (×10): 1 [IU] via SUBCUTANEOUS
  Administered 2022-04-01: 2 [IU] via SUBCUTANEOUS
  Administered 2022-04-02: 1 [IU] via SUBCUTANEOUS
  Filled 2022-03-18: qty 0.09

## 2022-03-18 MED ORDER — METOPROLOL TARTRATE 5 MG/5ML IV SOLN
5.0000 mg | Freq: Four times a day (QID) | INTRAVENOUS | Status: DC | PRN
  Administered 2022-03-18 – 2022-03-23 (×8): 5 mg via INTRAVENOUS
  Filled 2022-03-18 (×8): qty 5

## 2022-03-18 MED ORDER — INSULIN GLARGINE-YFGN 100 UNIT/ML ~~LOC~~ SOLN
10.0000 [IU] | Freq: Every day | SUBCUTANEOUS | Status: DC
  Administered 2022-03-18 – 2022-04-01 (×15): 10 [IU] via SUBCUTANEOUS
  Filled 2022-03-18 (×16): qty 0.1

## 2022-03-18 MED ORDER — HEPARIN SODIUM (PORCINE) 5000 UNIT/ML IJ SOLN
5000.0000 [IU] | Freq: Three times a day (TID) | INTRAMUSCULAR | Status: DC
  Administered 2022-03-18 – 2022-04-02 (×46): 5000 [IU] via SUBCUTANEOUS
  Filled 2022-03-18 (×47): qty 1

## 2022-03-18 MED ORDER — INSULIN ASPART 100 UNIT/ML IJ SOLN
0.0000 [IU] | Freq: Every day | INTRAMUSCULAR | Status: DC
  Filled 2022-03-18: qty 0.05

## 2022-03-18 MED ORDER — ONDANSETRON HCL 4 MG/2ML IJ SOLN: 4.0000 mg | Freq: Four times a day (QID) | INTRAMUSCULAR | Status: DC | PRN

## 2022-03-18 MED ORDER — HYDRALAZINE HCL 25 MG PO TABS: 25.0000 mg | ORAL_TABLET | Freq: Three times a day (TID) | ORAL | Status: DC

## 2022-03-18 MED ORDER — PANTOPRAZOLE SODIUM 40 MG PO TBEC
40.0000 mg | DELAYED_RELEASE_TABLET | Freq: Every day | ORAL | Status: DC
  Administered 2022-03-18 – 2022-04-02 (×8): 40 mg via ORAL
  Filled 2022-03-18 (×14): qty 1

## 2022-03-18 MED ORDER — CARVEDILOL 3.125 MG PO TABS
3.1250 mg | ORAL_TABLET | Freq: Two times a day (BID) | ORAL | Status: DC
  Administered 2022-03-18 – 2022-03-21 (×7): 3.125 mg via ORAL
  Filled 2022-03-18 (×7): qty 1

## 2022-03-18 NOTE — Consult Note (Signed)
WOC Nurse Consult Note: Reason for Consult: Consult requested for left stump wound Wound type: Left posterior stump with healing full thickness wound; .5X1X.2cm, red and dry.  Pt states it is where his prosthesis has been rubbing.  Encouraged patient to have the prosthesis refitted after he is discharged by his medical orthotics supplier.  Dressing procedure/placement/frequency: Topical treatment orders provided for bedside nurses to perform as follows to protect from further injury: Foam dressing to left stump wound; change Q 3 days or PRN soiling.  Apply stump shrinker over the dressing. Please re-consult if further assistance is needed.  Thank-you,  Julien Girt MSN, [place], [place], [place], [place]

## 2022-03-18 NOTE — Progress Notes (Signed)
Right upper extremity venous duplex has been completed. Preliminary results can be found in CV Proc through chart review.  Results were given to Dr. Verlon Au.  03/18/22 10:03 AM [name] RVT

## 2022-03-18 NOTE — Progress Notes (Signed)
Heart Failure Navigator Progress Note  Assessed for Heart & Vascular TOC clinic readiness.  Patient EF 60-65%, per MD note patient admission of Pneumonia vs Acute Heart Failure. .   Navigator will sign off at this time.    Earnestine Leys, BSN, Clinical cytogeneticist Only

## 2022-03-18 NOTE — Progress Notes (Signed)
2D Echocardiogram has been performed.  [name] 03/18/2022, 3:35 PM

## 2022-03-18 NOTE — Progress Notes (Signed)
PROGRESS NOTE   [name]  KGU:[id number] DOB: 05-May-1960 DOA: 03/17/2022 PCP: Jodi Marble, MD  Brief Narrative:  62 year old black male [facility] resident HFpEF EF 60% CKD 4 baseline. 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 Bilateral thyroid nodules Bipolar  Brought over from nursing facility with SOB respiratory distress O2 sats 88% Workup showed BNP 378 COVID influenza RSV all negative CT chest groundglass opacities mostly in lower lobes trace bilateral pleural effusions Admitted for HFpEF in the setting of super morbid obesity  Hospital-Problem based course  Acute hypoxic respiratory failure - Secondary to edema procalcitonin is negative Hold antibiotics  Decompensated HFpEF in the setting of probable hepatorenal syndrome Underlying BPH - Usually is on Lasix 80 twice daily at facility -Will need some diuresis so we will continue on IV Lasix at 60 IV twice daily - Continue Flomax 0.8 make sure bladder scan is done - Strict I/O bladder scan urine urea and urine creatinine to be obtained to ensure no other etiology - May need discussion regarding HD and may need discussion with nephrology - Continue aspirin 81  CKD 3, hepatorenal syndrome, metabolic acidosis secondary to renal disease - Allow slightly higher pressure to allow for diuresis hold hydralazine 25 3 times daily holding amlodipine 10 -Continue clonidine 0.1 every 6 if chronic med  Peripheral vascular disease DM TY 2 Prior amputation - Strict glycemic control, continue statin continue sliding scale, continue Semglee 10 units (home dose 15) - Sugars less than 120 currently currently - Continue Lyrica in the morning if mental status remains the same need to be dosed renally probably - He has a wound at the bottom of his stump and I will ask wound care to come and evaluate this  Normocytic normochromic anemia - Get iron studies probably has anemia of chronic  disease - Probably needs 1 unit of blood his baseline is in the 9-10 range and he is probably not producing blood because of his renal insufficiency  Previous CVA 10/2019 - Secondary prevention with statin etc.    DVT prophylaxis: SCD Code Status: Presumed full Family Communication: None Disposition:  Status is: Observation The patient will require care spanning > 2 midnights and should be moved to inpatient because: Will need diuresis     Subjective: Awake coherent not short of breath off oxygen sats remained above 92 No chest pain no fever Tells me always has a chronic right arm swelling and cannot use it Seems to be hungry Does not seem to be in distress He is talking full sentences  Objective: Vitals:   03/18/22 0011 03/18/22 0330 03/18/22 0401 03/18/22 0630  BP: (!) 179/102 (!) 166/97  (!) 176/98  Pulse: 81 75  73  Resp: '20 18  20  '$ Temp:   98.3 F (36.8 C)   TempSrc:   Oral   SpO2: 99% 99%  99%    Intake/Output Summary (Last 24 hours) at 03/18/2022 0740 Last data filed at 03/18/2022 0118 Gross per 24 hour  Intake 84.45 ml  Output --  Net 84.45 ml   There were no vitals filed for this visit.  Examination:  EOMI NCAT no focal deficit Chest is relatively clear Cannot appreciate JVD Abdomen is obese nontender nondistended no rebound Wound noted at bottom of his stump on the left side. He has brawny edema and anasarca in lower extremities so we will need to diurese Overall orientation is intact  Data Reviewed: personally reviewed  CBC    Component Value Date/Time   WBC 5.2 03/18/2022 0404   RBC 2.95 (L) 03/18/2022 0404   HGB 7.4 (L) 03/18/2022 0404   HGB 13.9 12/31/2013 1336   HCT 24.8 (L) 03/18/2022 0404   HCT 43.0 12/31/2013 1336   PLT 162 03/18/2022 0404   PLT 184 12/31/2013 1336   MCV 84.1 03/18/2022 0404   MCV 81 12/31/2013 1336   MCH 25.1 (L) 03/18/2022 0404   MCHC 29.8 (L) 03/18/2022 0404   RDW 16.9 (H) 03/18/2022 0404   RDW 14.4  12/31/2013 1336   LYMPHSABS 1.2 03/18/2022 0404   MONOABS 0.4 03/18/2022 0404   EOSABS 0.2 03/18/2022 0404   BASOSABS 0.0 03/18/2022 0404      Latest Ref Rng & Units 03/18/2022    4:04 AM 03/17/2022    2:40 PM 12/10/2021   10:04 AM  CMP  Glucose 70 - 99 mg/dL 104  171  136   BUN 6 - 20 mg/dL 53  57  44   Creatinine 0.61 - 1.24 mg/dL 3.17  3.22  2.61   Sodium 135 - 145 mmol/L 139  134  137   Potassium 3.5 - 5.1 mmol/L 4.1  4.3  3.7   Chloride 98 - 111 mmol/L 113  112  111   CO2 22 - 32 mmol/L '20  17  19   '$ Calcium 8.9 - 10.3 mg/dL 8.0  7.8  8.0   Total Protein 6.5 - 8.1 g/dL 6.4  7.0    Total Bilirubin 0.3 - 1.2 mg/dL 0.4  0.5    Alkaline Phos 38 - 126 U/L 91  115    AST 15 - 41 U/L 10  16    ALT 0 - 44 U/L 15  18       Radiology Studies: CT Chest Wo Contrast  Result Date: 03/17/2022 CLINICAL DATA:  Pneumonia. EXAM: CT CHEST WITHOUT CONTRAST TECHNIQUE: Multidetector CT imaging of the chest was performed following the standard protocol without IV contrast. RADIATION DOSE REDUCTION: This exam was performed according to the departmental dose-optimization program which includes automated exposure control, adjustment of the mA and/or kV according to patient size and/or use of iterative reconstruction technique. COMPARISON:  Chest x-ray same day.  CT angiogram chest 10/19/2021. FINDINGS: Cardiovascular: Heart is mildly enlarged. There is a small pericardial effusion. Aorta is normal in size. There are atherosclerotic calcifications of the aorta. Mediastinum/Nodes: Multiple thyroid nodules are present bilaterally measuring up to 2.2 cm on the left image 2/17, similar to prior. No definite enlarged mediastinal, hilar or axillary lymph nodes allowing for lack of intravenous contrast. The esophagus is nondilated. Lungs/Pleura: There are diffuse patchy ground-glass opacities throughout both lungs most significant in the bilateral upper lobes. Additional upper lobe airspace opacities are present.  There is some smooth interlobular septal thickening predominantly in the upper lobes. There are trace bilateral pleural effusions. There is a 6 mm nodular density in the left upper lobe image 7/71, new from prior. No pneumothorax. Trachea and central airways patent as can be seen. Upper Abdomen: Gallstone present. Musculoskeletal: No acute fractures are seen. There is bilateral gynecomastia and mild body wall edema. IMPRESSION: 1. Diffuse patchy ground-glass opacities throughout both lungs most significant in the bilateral upper lobes. There is some smooth interlobular septal thickening in the upper lobes. Findings are favored as infectious/inflammatory process. 2. Trace bilateral pleural effusions. 3. 6 mm left solid pulmonary nodule within the upper lobe. Per Fleischner Society Guidelines, recommend a non-contrast Chest CT  at 6-12 months. If patient is high risk for malignancy, consider an additional non-contrast Chest CT at 18-24 months. If patient is low risk for malignancy, non-contrast Chest CT at 18-24 months is optional. These guidelines do not apply to immunocompromised patients and patients with cancer. Follow up in patients with significant comorbidities as clinically warranted. For lung cancer screening, adhere to Lung-RADS guidelines. Reference: Radiology. 2017; 284(1):228-43. 4. Mild cardiomegaly with small pericardial effusion. 5. Body wall edema. 6. Cholelithiasis. 7. Thyroid nodules measuring up to 2.2 cm. Follow-up ultrasound recommended. Aortic Atherosclerosis (ICD10-I70.0). Electronically Signed   By: Ronney Asters M.D.   On: 03/17/2022 16:26   DG Chest Portable 1 View  Result Date: 03/17/2022 CLINICAL DATA:  Shortness of breath. EXAM: PORTABLE CHEST 1 VIEW COMPARISON:  12/08/2021 FINDINGS: 1459 hours. Low volume film. The cardio pericardial silhouette is enlarged. Diffuse hazy bilateral airspace opacity noted with some sparing at the right base. The visualized bony structures of the thorax  are unremarkable. Telemetry leads overlie the chest. IMPRESSION: Low volume film with diffuse hazy bilateral airspace opacity. Imaging features suggest pulmonary edema although diffuse infection not excluded. Electronically Signed   By: Misty Stanley M.D.   On: 03/17/2022 15:09     Scheduled Meds:  aspirin  81 mg Oral Daily   carvedilol  3.125 mg Oral BID WC   heparin  5,000 Units Subcutaneous Q8H   hydrALAZINE  25 mg Oral TID   insulin aspart  0-5 Units Subcutaneous QHS   insulin aspart  0-9 Units Subcutaneous TID WC   insulin glargine-yfgn  10 Units Subcutaneous QHS   pantoprazole  40 mg Oral Daily   tamsulosin  0.8 mg Oral QPM   Continuous Infusions:   LOS: 0 days   Time spent: [place], MD [facility] To contact the attending provider between 7A-7P or the covering provider during after hours 7P-7A, please log into the web site [email or web address] and access using universal [place] password for that web site. If you do not have the password, please call the hospital operator.  03/18/2022, 7:40 AM

## 2022-03-18 NOTE — ED Notes (Signed)
ED TO INPATIENT HANDOFF REPORT  ED Nurse Name and Phone #: Danise Edge Name/Age/Gender [name] 62 y.o. male Room/Bed: WA16/WA16  Code Status   Code Status: Full Code  Home/SNF/Other Skilled nursing facility Patient oriented to: self, place, time, and situation Is this baseline? Yes   Triage Complete: Triage complete  Chief Complaint Acute on chronic diastolic CHF (congestive heart failure) ([place]) [I50.33]  Triage Note EMS reports from [facility], called out for shortness of breath since yesterday, worsened acutely x 1 hour, reported rails all fields. Hx of CHF. Also noted increased swelling right arm and leg. Arrived on C-pap with improvement.  BP 168/94 HR 102 RR 24 Sp02 88 RA at facility.   0.4 Nitro sublingual enroute.  18 ga L forearm   Allergies Allergies  Allergen Reactions   Other Anaphylaxis, Itching, Swelling and Other (See Comments)    Grits - swelling    Level of Care/Admitting Diagnosis ED Disposition     ED Disposition  Admit   Condition  --   Comment  Hospital Area: [facility] [[id number]]  Level of Care: Telemetry [5]  Admit to tele based on following criteria: Acute CHF  May admit patient to [facility] or [facility] if equivalent level of care is available:: No  Covid Evaluation: Asymptomatic - no recent exposure (last 10 days) testing not required  Diagnosis: Acute on chronic diastolic CHF (congestive heart failure) [facility]) [[id number]]  Admitting Physician: [place], [place]  Attending Physician: Nita Sells [[id number]]  Certification:: I certify this patient will need inpatient services for at least 2 midnights  Estimated Length of Stay: 2          B Medical/Surgery History Past Medical History:  Diagnosis Date   Acute on chronic diastolic CHF (congestive heart failure) ([place]) 08/22/2020   Acute on chronic heart failure with preserved ejection fraction ([place])    Acute renal failure superimposed on  stage 3a chronic kidney disease ([place]) 11/18/2020   Arthritis    Bacteremia due to Enterococcus 12/09/2021   Bipolar affective ([place])    CHF (congestive heart failure) ([place])    Claustrophobia    Severe   Cocaine abuse ([place]) 05/31/2014   Cocaine dependence, uncomplicated ([place]) 24/10/7351   Depression    Diabetes mellitus    uncontrolled    Elevated CK    Heart murmur    Hypertension    Ileus ([place]) 29/92/4268   Metabolic acidosis 34/19/6222   Non-traumatic rhabdomyolysis 01/01/2020   Pericardial effusion 08/22/2020   Severe recurrent major depression with psychotic features ([place]) 01/04/2015   Suicidal ideations 08/28/2013   Past Surgical History:  Procedure Laterality Date   ABDOMINAL AORTOGRAM W/LOWER EXTREMITY Left 03/02/2021   Procedure: ABDOMINAL AORTOGRAM W/LOWER EXTREMITY;  Surgeon: Cherre Robins, MD;  Location: [facility];  Service: Cardiovascular;  Laterality: Left;   AMPUTATION Left 03/07/2021   Procedure: LEFT BELOW KNEE AMPUTATION;  Surgeon: Cherre Robins, MD;  Location: [facility];  Service: Vascular;  Laterality: Left;   APPLICATION OF WOUND VAC Left 03/07/2021   Procedure: APPLICATION OF WOUND VAC;  Surgeon: Cherre Robins, MD;  Location: [place];  Service: Vascular;  Laterality: Left;   BOWEL DECOMPRESSION N/A 01/19/2020   Procedure: BOWEL DECOMPRESSION;  Surgeon: Otis Brace, MD;  Location: [place];  Service: Gastroenterology;  Laterality: N/A;   BOWEL DECOMPRESSION N/A 11/19/2020   Procedure: BOWEL DECOMPRESSION;  Surgeon: Wilford Corner, MD;  [facility];  Service: Endoscopy;  Laterality:  N/A;   BOWEL DECOMPRESSION N/A 12/03/2020   Procedure: BOWEL DECOMPRESSION;  Surgeon: Ronnette Juniper, MD;  [facility];  Service: Gastroenterology;  Laterality: N/A;   COLONOSCOPY WITH PROPOFOL N/A 01/19/2020   Procedure: COLONOSCOPY WITH PROPOFOL;  Surgeon: Otis Brace, MD;  Location: [place];  Service: Gastroenterology;  Laterality:  N/A;   COLONOSCOPY WITH PROPOFOL N/A 11/19/2020   Procedure: COLONOSCOPY WITH PROPOFOL;  Surgeon: Wilford Corner, MD;  [facility];  Service: Endoscopy;  Laterality: N/A;   COLONOSCOPY WITH PROPOFOL N/A 12/03/2020   Procedure: COLONOSCOPY WITH PROPOFOL;  Surgeon: Ronnette Juniper, MD;  [facility];  Service: Gastroenterology;  Laterality: N/A;   KNEE SURGERY     b/l knees; pending total knee    RADIOLOGY WITH ANESTHESIA N/A 07/04/2020   Procedure: MRI WITH ANESTHESIA BRAIN WITHOUT CONTRAST AND HEAD WITHOUT CONTRAST;  Surgeon: Radiologist, Medication, MD;  Location: [place];  Service: Radiology;  Laterality: N/A;   TRANSMETATARSAL AMPUTATION Left 03/01/2021   Procedure: TRANSMETATARSAL AMPUTATION;  Surgeon: Trula Slade, DPM;  Location: [place];  Service: Podiatry;  Laterality: Left;     A IV Location/Drains/Wounds Patient Lines/Drains/Airways Status     Active Line/Drains/Airways     Name Placement date Placement time Site Days   Peripheral IV 03/17/22 20 G Anterior;Left Forearm 03/17/22  1442  Forearm  1   Peripheral IV 03/17/22 20 G Anterior;Left Forearm 03/17/22  2207  Forearm  1   Incision (Closed) 03/07/21 Leg Left 03/07/21  1606  -- 376   Pressure Injury 03/02/21 Coccyx 03/02/21  1740  -- 381   Pressure Injury 12/08/21 Other (Comment) Left;Posterior Unstageable - Full thickness tissue loss in which the base of the injury is covered by slough (yellow, tan, gray, green or brown) and/or eschar (tan, brown or black) in the wound bed. 12/08/21  2300  -- 100   Wound / Incision (Open or Dehisced) 03/12/21 Other (Comment) Tibial Posterior;Right blister like wound on posterior calf, leaking serous fluid. Covered with gauze and wrapped in kerlix 03/12/21  1650  Tibial  371   Wound / Incision (Open or Dehisced) 03/18/22 Other (Comment) Leg Left 03/18/22  --  Leg  less than 1            Intake/Output Last 24 hours  Intake/Output Summary (Last 24 hours) at 03/18/2022  1417 Last data filed at 03/18/2022 0118 Gross per 24 hour  Intake 84.45 ml  Output --  Net 84.45 ml    Labs/Imaging Results for orders placed or performed during the hospital encounter of 03/17/22 (from the past 48 hour(s))  CBC with Differential     Status: Abnormal   Collection Time: 03/17/22  2:40 PM  Result Value Ref Range   WBC 6.3 4.0 - 10.5 K/uL   RBC 3.17 (L) 4.22 - 5.81 MIL/uL   Hemoglobin 8.1 (L) 13.0 - 17.0 g/dL   HCT 26.6 (L) 39.0 - 52.0 %   MCV 83.9 80.0 - 100.0 fL   MCH 25.6 (L) 26.0 - 34.0 pg   MCHC 30.5 30.0 - 36.0 g/dL   RDW 17.2 (H) 11.5 - 15.5 %   Platelets 174 150 - 400 K/uL   nRBC 0.0 0.0 - 0.2 %   Neutrophils Relative % 78 %   Neutro Abs 4.9 1.7 - 7.7 K/uL   Lymphocytes Relative 13 %   Lymphs Abs 0.8 0.7 - 4.0 K/uL   Monocytes Relative 6 %   Monocytes Absolute 0.4 0.1 - 1.0 K/uL  Eosinophils Relative 2 %   Eosinophils Absolute 0.1 0.0 - 0.5 K/uL   Basophils Relative 0 %   Basophils Absolute 0.0 0.0 - 0.1 K/uL   Immature Granulocytes 1 %   Abs Immature Granulocytes 0.03 0.00 - 0.07 K/uL    Comment: Performed at [facility], [place] [address], [place], [place] [address]  Comprehensive metabolic panel     Status: Abnormal   Collection Time: 03/17/22  2:40 PM  Result Value Ref Range   Sodium 134 (L) 135 - 145 mmol/L   Potassium 4.3 3.5 - 5.1 mmol/L   Chloride 112 (H) 98 - 111 mmol/L   CO2 17 (L) 22 - 32 mmol/L   Glucose, Bld 171 (H) 70 - 99 mg/dL    Comment: Glucose reference range applies only to samples taken after fasting for at least 8 hours.   BUN 57 (H) 6 - 20 mg/dL   Creatinine, Ser 3.22 (H) 0.61 - 1.24 mg/dL   Calcium 7.8 (L) 8.9 - 10.3 mg/dL   Total Protein 7.0 6.5 - 8.1 g/dL   Albumin 2.7 (L) 3.5 - 5.0 g/dL   AST 16 15 - 41 U/L   ALT 18 0 - 44 U/L   Alkaline Phosphatase 115 38 - 126 U/L   Total Bilirubin 0.5 0.3 - 1.2 mg/dL   GFR, Estimated 21 (L) >60 mL/min    Comment: (NOTE) Calculated using the CKD-EPI Creatinine  Equation (2021)    Anion gap 5 5 - 15    Comment: Performed at [facility], [place] [address] [place], [place] [address]  Blood gas, venous (at [facility])     Status: Abnormal   Collection Time: 03/17/22  2:40 PM  Result Value Ref Range   pH, Ven 7.34 7.25 - 7.43   pCO2, Ven 35 (L) 44 - 60 mmHg   pO2, Ven 71 (H) 32 - 45 mmHg   Bicarbonate 18.9 (L) 20.0 - 28.0 mmol/L   Acid-base deficit 6.1 (H) 0.0 - 2.0 mmol/L   O2 Saturation 97.2 %   Patient temperature 37.0     Comment: Performed at [facility], [place] [address], [place], [place] [address]  Troponin I (High Sensitivity)     Status: Abnormal   Collection Time: 03/17/22  2:40 PM  Result Value Ref Range   Troponin I (High Sensitivity) 25 (H) <18 ng/L    Comment: (NOTE) Elevated high sensitivity troponin I (hsTnI) values and significant  changes across serial measurements may suggest ACS but many other  chronic and acute conditions are known to elevate hsTnI results.  Refer to the "Links" section for chest pain algorithms and additional  guidance. Performed at [facility], [place] [address] [place], [place] [address]   Brain natriuretic peptide     Status: Abnormal   Collection Time: 03/17/22  2:40 PM  Result Value Ref Range   B Natriuretic Peptide 370.1 (H) 0.0 - 100.0 pg/mL    Comment: Performed at [facility], [place] [address], [place], [place] [address]  Resp panel by RT-PCR (RSV, Flu A&B, Covid) Anterior Nasal Swab     Status: None   Collection Time: 03/17/22  3:00 PM   Specimen: Anterior Nasal Swab  Result Value Ref Range   SARS Coronavirus 2 by RT PCR NEGATIVE NEGATIVE    Comment: (NOTE) SARS-CoV-2 target nucleic acids are NOT DETECTED.  The SARS-CoV-2 RNA is generally detectable in upper respiratory specimens during the acute  phase of infection. The lowest concentration of SARS-CoV-2 viral copies this assay can detect is 138 copies/mL. A negative  result does not preclude SARS-Cov-2 infection and should not be used as the sole basis for treatment or other patient management decisions. A negative result may occur with  improper specimen collection/handling, submission of specimen other than nasopharyngeal swab, presence of viral mutation(s) within the areas targeted by this assay, and inadequate number of viral copies(<138 copies/mL). A negative result must be combined with clinical observations, patient history, and epidemiological information. The expected result is Negative.  Fact Sheet for Patients:  EntrepreneurPulse.com.au  Fact Sheet for Healthcare Providers:  IncredibleEmployment.be  This test is no t yet approved or cleared by the [place] FDA and  has been authorized for detection and/or diagnosis of SARS-CoV-2 by FDA under an Emergency Use Authorization (EUA). This EUA will remain  in effect (meaning this test can be used) for the duration of the COVID-19 declaration under Section 564(b)(1) of the Act, 21 U.S.C.section 360bbb-3(b)(1), unless the authorization is terminated  or revoked sooner.       Influenza A by PCR NEGATIVE NEGATIVE   Influenza B by PCR NEGATIVE NEGATIVE    Comment: (NOTE) The Xpert Xpress SARS-CoV-2/FLU/RSV plus assay is intended as an aid in the diagnosis of influenza from Nasopharyngeal swab specimens and should not be used as a sole basis for treatment. Nasal washings and aspirates are unacceptable for Xpert Xpress SARS-CoV-2/FLU/RSV testing.  Fact Sheet for Patients: EntrepreneurPulse.com.au  Fact Sheet for Healthcare Providers: IncredibleEmployment.be  This test is not yet approved or cleared by the [place] FDA and has been authorized for detection and/or diagnosis of SARS-CoV-2 by FDA under an Emergency Use Authorization (EUA). This EUA will remain in effect (meaning this test can be used) for the  duration of the COVID-19 declaration under Section 564(b)(1) of the Act, 21 U.S.C. section 360bbb-3(b)(1), unless the authorization is terminated or revoked.     Resp Syncytial Virus by PCR NEGATIVE NEGATIVE    Comment: (NOTE) Fact Sheet for Patients: EntrepreneurPulse.com.au  Fact Sheet for Healthcare Providers: IncredibleEmployment.be  This test is not yet approved or cleared by the [place] FDA and has been authorized for detection and/or diagnosis of SARS-CoV-2 by FDA under an Emergency Use Authorization (EUA). This EUA will remain in effect (meaning this test can be used) for the duration of the COVID-19 declaration under Section 564(b)(1) of the Act, 21 U.S.C. section 360bbb-3(b)(1), unless the authorization is terminated or revoked.  Performed at [facility], [place] [address] [place], [place] [address]   Troponin I (High Sensitivity)     Status: Abnormal   Collection Time: 03/17/22  4:40 PM  Result Value Ref Range   Troponin I (High Sensitivity) 38 (H) <18 ng/L    Comment: (NOTE) Elevated high sensitivity troponin I (hsTnI) values and significant  changes across serial measurements may suggest ACS but many other  chronic and acute conditions are known to elevate hsTnI results.  Refer to the "Links" section for chest pain algorithms and additional  guidance. Performed at [facility], [place] [address] [place], [place] [address]   Brain natriuretic peptide     Status: Abnormal   Collection Time: 03/18/22  4:04 AM  Result Value Ref Range   B Natriuretic Peptide 575.9 (H) 0.0 - 100.0 pg/mL    Comment: Performed at [facility], [place] [address], [place], [place] [address]  CBC with Differential/Platelet  Status: Abnormal   Collection Time: 03/18/22  4:04 AM  Result Value Ref Range   WBC 5.2 4.0 - 10.5 K/uL   RBC 2.95 (L) 4.22 - 5.81 MIL/uL   Hemoglobin 7.4 (L) 13.0 -  17.0 g/dL   HCT 24.8 (L) 39.0 - 52.0 %   MCV 84.1 80.0 - 100.0 fL   MCH 25.1 (L) 26.0 - 34.0 pg   MCHC 29.8 (L) 30.0 - 36.0 g/dL   RDW 16.9 (H) 11.5 - 15.5 %   Platelets 162 150 - 400 K/uL   nRBC 0.0 0.0 - 0.2 %   Neutrophils Relative % 65 %   Neutro Abs 3.3 1.7 - 7.7 K/uL   Lymphocytes Relative 23 %   Lymphs Abs 1.2 0.7 - 4.0 K/uL   Monocytes Relative 8 %   Monocytes Absolute 0.4 0.1 - 1.0 K/uL   Eosinophils Relative 3 %   Eosinophils Absolute 0.2 0.0 - 0.5 K/uL   Basophils Relative 1 %   Basophils Absolute 0.0 0.0 - 0.1 K/uL   Immature Granulocytes 0 %   Abs Immature Granulocytes 0.02 0.00 - 0.07 K/uL    Comment: Performed at [facility], [place] [address] [place], [place] [address]  Comprehensive metabolic panel     Status: Abnormal   Collection Time: 03/18/22  4:04 AM  Result Value Ref Range   Sodium 139 135 - 145 mmol/L   Potassium 4.1 3.5 - 5.1 mmol/L   Chloride 113 (H) 98 - 111 mmol/L   CO2 20 (L) 22 - 32 mmol/L   Glucose, Bld 104 (H) 70 - 99 mg/dL    Comment: Glucose reference range applies only to samples taken after fasting for at least 8 hours.   BUN 53 (H) 6 - 20 mg/dL   Creatinine, Ser 3.17 (H) 0.61 - 1.24 mg/dL   Calcium 8.0 (L) 8.9 - 10.3 mg/dL   Total Protein 6.4 (L) 6.5 - 8.1 g/dL   Albumin 2.5 (L) 3.5 - 5.0 g/dL   AST 10 (L) 15 - 41 U/L   ALT 15 0 - 44 U/L   Alkaline Phosphatase 91 38 - 126 U/L   Total Bilirubin 0.4 0.3 - 1.2 mg/dL   GFR, Estimated 22 (L) >60 mL/min    Comment: (NOTE) Calculated using the CKD-EPI Creatinine Equation (2021)    Anion gap 6 5 - 15    Comment: Performed at [facility], [place] [address], [place], [place] [address]  Procalcitonin - Baseline     Status: None   Collection Time: 03/18/22  4:04 AM  Result Value Ref Range   Procalcitonin <0.10 ng/mL    Comment:        Interpretation: PCT (Procalcitonin) <= 0.5 ng/mL: Systemic infection (sepsis) is not likely. Local bacterial infection is  possible. (NOTE)       Sepsis PCT Algorithm           Lower Respiratory Tract                                      Infection PCT Algorithm    ----------------------------     ----------------------------         PCT < 0.25 ng/mL                PCT < 0.10 ng/mL          Strongly encourage  Strongly discourage   discontinuation of antibiotics    initiation of antibiotics    ----------------------------     -----------------------------       PCT 0.25 - 0.50 ng/mL            PCT 0.10 - 0.25 ng/mL               OR       >80% decrease in PCT            Discourage initiation of                                            antibiotics      Encourage discontinuation           of antibiotics    ----------------------------     -----------------------------         PCT >= 0.50 ng/mL              PCT 0.26 - 0.50 ng/mL               AND        <80% decrease in PCT             Encourage initiation of                                             antibiotics       Encourage continuation           of antibiotics    ----------------------------     -----------------------------        PCT >= 0.50 ng/mL                  PCT > 0.50 ng/mL               AND         increase in PCT                  Strongly encourage                                      initiation of antibiotics    Strongly encourage escalation           of antibiotics                                     -----------------------------                                           PCT <= 0.25 ng/mL                                                 OR                                        >  80% decrease in PCT                                      Discontinue / Do not initiate                                             antibiotics  Performed at [place] [address], [place], [place] [address]   CBG monitoring, ED     Status: None   Collection Time: 03/18/22  8:14 AM  Result Value Ref Range    Glucose-Capillary 93 70 - 99 mg/dL    Comment: Glucose reference range applies only to samples taken after fasting for at least 8 hours.  CBG monitoring, ED     Status: Abnormal   Collection Time: 03/18/22 11:51 AM  Result Value Ref Range   Glucose-Capillary 128 (H) 70 - 99 mg/dL    Comment: Glucose reference range applies only to samples taken after fasting for at least 8 hours.   VAS [place] UPPER EXTREMITY VENOUS DUPLEX  Result Date: 03/18/2022 UPPER VENOUS STUDY  Patient Name:  [name]  Date of Exam:   03/18/2022 Medical Rec #: [id number]      Accession #:    [id number] Date of Birth: 1960-08-30       Patient Gender: M Patient Age:   15 years Exam Location:  [facility] Procedure:      VAS [place] UPPER EXTREMITY VENOUS DUPLEX Referring Phys: Nita Sells --------------------------------------------------------------------------------  Indications: Swelling Risk Factors: None identified. Limitations: Poor ultrasound/tissue interface. Comparison Study: No prior studies. Performing Technologist: Oliver Hum RVT  Examination Guidelines: A complete evaluation includes B-mode imaging, spectral Doppler, color Doppler, and power Doppler as needed of all accessible portions of each vessel. Bilateral testing is considered an integral part of a complete examination. Limited examinations for reoccurring indications may be performed as noted.  Right Findings: +----------+------------+---------+-----------+----------+-------+ RIGHT     CompressiblePhasicitySpontaneousPropertiesSummary +----------+------------+---------+-----------+----------+-------+ IJV           Full       Yes       Yes                      +----------+------------+---------+-----------+----------+-------+ Subclavian    Full       Yes       Yes                      +----------+------------+---------+-----------+----------+-------+ Axillary      Full       Yes       Yes                       +----------+------------+---------+-----------+----------+-------+ Brachial      Full       Yes       Yes                      +----------+------------+---------+-----------+----------+-------+ Radial        Full                                          +----------+------------+---------+-----------+----------+-------+ Ulnar  Full                                          +----------+------------+---------+-----------+----------+-------+ Cephalic      Full                                          +----------+------------+---------+-----------+----------+-------+ Basilic       Full                                          +----------+------------+---------+-----------+----------+-------+  Left Findings: +----------+------------+---------+-----------+----------+-------+ LEFT      CompressiblePhasicitySpontaneousPropertiesSummary +----------+------------+---------+-----------+----------+-------+ Subclavian    Full       Yes       Yes                      +----------+------------+---------+-----------+----------+-------+  Summary:  Right: No evidence of deep vein thrombosis in the upper extremity. No evidence of superficial vein thrombosis in the upper extremity.  Left: No evidence of thrombosis in the subclavian.  *See table(s) above for measurements and observations.  Diagnosing physician: Harold Barban MD Electronically signed by Harold Barban MD on 03/18/2022 at 11:33:14 AM.    Final    CT Chest Wo Contrast  Result Date: 03/17/2022 CLINICAL DATA:  Pneumonia. EXAM: CT CHEST WITHOUT CONTRAST TECHNIQUE: Multidetector CT imaging of the chest was performed following the standard protocol without IV contrast. RADIATION DOSE REDUCTION: This exam was performed according to the departmental dose-optimization program which includes automated exposure control, adjustment of the mA and/or kV according to patient size and/or use of iterative reconstruction technique.  COMPARISON:  Chest x-ray same day.  CT angiogram chest 10/19/2021. FINDINGS: Cardiovascular: Heart is mildly enlarged. There is a small pericardial effusion. Aorta is normal in size. There are atherosclerotic calcifications of the aorta. Mediastinum/Nodes: Multiple thyroid nodules are present bilaterally measuring up to 2.2 cm on the left image 2/17, similar to prior. No definite enlarged mediastinal, hilar or axillary lymph nodes allowing for lack of intravenous contrast. The esophagus is nondilated. Lungs/Pleura: There are diffuse patchy ground-glass opacities throughout both lungs most significant in the bilateral upper lobes. Additional upper lobe airspace opacities are present. There is some smooth interlobular septal thickening predominantly in the upper lobes. There are trace bilateral pleural effusions. There is a 6 mm nodular density in the left upper lobe image 7/71, new from prior. No pneumothorax. Trachea and central airways patent as can be seen. Upper Abdomen: Gallstone present. Musculoskeletal: No acute fractures are seen. There is bilateral gynecomastia and mild body wall edema. IMPRESSION: 1. Diffuse patchy ground-glass opacities throughout both lungs most significant in the bilateral upper lobes. There is some smooth interlobular septal thickening in the upper lobes. Findings are favored as infectious/inflammatory process. 2. Trace bilateral pleural effusions. 3. 6 mm left solid pulmonary nodule within the upper lobe. Per Fleischner Society Guidelines, recommend a non-contrast Chest CT at 6-12 months. If patient is high risk for malignancy, consider an additional non-contrast Chest CT at 18-24 months. If patient is low risk for malignancy, non-contrast Chest CT at 18-24 months is optional. These guidelines do not apply to immunocompromised patients and patients with cancer. Follow up in patients with  significant comorbidities as clinically warranted. For lung cancer screening, adhere to Lung-RADS  guidelines. Reference: Radiology. 2017; 284(1):228-43. 4. Mild cardiomegaly with small pericardial effusion. 5. Body wall edema. 6. Cholelithiasis. 7. Thyroid nodules measuring up to 2.2 cm. Follow-up ultrasound recommended. Aortic Atherosclerosis (ICD10-I70.0). Electronically Signed   By: Ronney Asters M.D.   On: 03/17/2022 16:26   DG Chest Portable 1 View  Result Date: 03/17/2022 CLINICAL DATA:  Shortness of breath. EXAM: PORTABLE CHEST 1 VIEW COMPARISON:  12/08/2021 FINDINGS: 1459 hours. Low volume film. The cardio pericardial silhouette is enlarged. Diffuse hazy bilateral airspace opacity noted with some sparing at the right base. The visualized bony structures of the thorax are unremarkable. Telemetry leads overlie the chest. IMPRESSION: Low volume film with diffuse hazy bilateral airspace opacity. Imaging features suggest pulmonary edema although diffuse infection not excluded. Electronically Signed   By: Misty Stanley M.D.   On: 03/17/2022 15:09    Pending Labs Unresulted Labs (From admission, onward)     Start     Ordered   03/18/22 0233  HIV Antibody (routine testing w rflx)  (HIV Antibody (Routine testing w reflex) panel)  Add-on,   AD        03/18/22 0232   03/18/22 0233  Hemoglobin A1c  Add-on,   AD       Comments: To assess prior glycemic control    03/18/22 0232   03/17/22 2050  Lactic acid, plasma  Add-on,   AD        03/17/22 2049   03/17/22 2041  Respiratory (~20 pathogens) panel by PCR  (Respiratory panel by PCR (~20 pathogens, ~24 hr TAT)  w precautions)  Once,   URGENT        03/17/22 2040   03/17/22 1802  Blood culture (routine x 2)  BLOOD CULTURE X 2,   R      03/17/22 1855            Vitals/Pain Today's Vitals   03/18/22 0751 03/18/22 1054 03/18/22 1154 03/18/22 1300  BP: (!) 170/87 (!) 145/85  (!) 173/97  Pulse: 80 75  74  Resp: 16 16    Temp: 97.8 F (36.6 C)  98 F (36.7 C)   TempSrc: Oral  Oral   SpO2: 99% 95%  96%  PainSc:        Isolation  Precautions Droplet precaution  Medications Medications  aspirin chewable tablet 81 mg (81 mg Oral Given 03/18/22 1038)  pantoprazole (PROTONIX) EC tablet 40 mg (40 mg Oral Given 03/18/22 1038)  tamsulosin (FLOMAX) capsule 0.8 mg (has no administration in time range)  carvedilol (COREG) tablet 3.125 mg (3.125 mg Oral Given 03/18/22 1038)  heparin injection 5,000 Units (5,000 Units Subcutaneous Given 03/18/22 0629)  acetaminophen (TYLENOL) tablet 650 mg (has no administration in time range)    Or  acetaminophen (TYLENOL) suppository 650 mg (has no administration in time range)  ondansetron (ZOFRAN) tablet 4 mg (has no administration in time range)    Or  ondansetron (ZOFRAN) injection 4 mg (has no administration in time range)  insulin aspart (novoLOG) injection 0-9 Units ( Subcutaneous Not Given 03/18/22 1344)  insulin aspart (novoLOG) injection 0-5 Units (has no administration in time range)  insulin glargine-yfgn (SEMGLEE) injection 10 Units (has no administration in time range)  furosemide (LASIX) injection 60 mg (60 mg Intravenous Given 03/18/22 1039)  cefTRIAXone (ROCEPHIN) 1 g in sodium chloride 0.9 % 100 mL IVPB (0 g Intravenous Stopped 03/17/22 2129)  azithromycin (ZITHROMAX) 500  mg in sodium chloride 0.9 % 250 mL IVPB (0 mg Intravenous Stopped 03/18/22 0023)  furosemide (LASIX) injection 80 mg (80 mg Intravenous Given 03/17/22 2114)  albumin human 25 % solution 25 g (0 g Intravenous Stopped 03/18/22 0118)    Mobility non-ambulatory     Focused Assessments     R Recommendations: See Admitting Provider Note  Report given to:   Additional Notes:

## 2022-03-19 ENCOUNTER — Encounter (HOSPITAL_COMMUNITY): Payer: Self-pay | Admitting: Family Medicine

## 2022-03-19 DIAGNOSIS — N184 Chronic kidney disease, stage 4 (severe): Secondary | ICD-10-CM | POA: Diagnosis not present

## 2022-03-19 DIAGNOSIS — Z89512 Acquired absence of left leg below knee: Secondary | ICD-10-CM | POA: Diagnosis not present

## 2022-03-19 DIAGNOSIS — Z515 Encounter for palliative care: Secondary | ICD-10-CM

## 2022-03-19 DIAGNOSIS — Z7189 Other specified counseling: Secondary | ICD-10-CM

## 2022-03-19 DIAGNOSIS — I5033 Acute on chronic diastolic (congestive) heart failure: Secondary | ICD-10-CM | POA: Diagnosis not present

## 2022-03-19 DIAGNOSIS — J9601 Acute respiratory failure with hypoxia: Secondary | ICD-10-CM | POA: Diagnosis not present

## 2022-03-19 LAB — CBC
HCT: 24.9 % — ABNORMAL LOW (ref 39.0–52.0)
Hemoglobin: 7.5 g/dL — ABNORMAL LOW (ref 13.0–17.0)
MCH: 25.3 pg — ABNORMAL LOW (ref 26.0–34.0)
MCHC: 30.1 g/dL (ref 30.0–36.0)
MCV: 83.8 fL (ref 80.0–100.0)
Platelets: 162 10*3/uL (ref 150–400)
RBC: 2.97 MIL/uL — ABNORMAL LOW (ref 4.22–5.81)
RDW: 17 % — ABNORMAL HIGH (ref 11.5–15.5)
WBC: 4.1 10*3/uL (ref 4.0–10.5)
nRBC: 0 % (ref 0.0–0.2)

## 2022-03-19 LAB — RETICULOCYTES
Immature Retic Fract: 15.4 % (ref 2.3–15.9)
RBC.: 2.87 MIL/uL — ABNORMAL LOW (ref 4.22–5.81)
Retic Count, Absolute: 41.9 10*3/uL (ref 19.0–186.0)
Retic Ct Pct: 1.5 % (ref 0.4–3.1)

## 2022-03-19 LAB — BASIC METABOLIC PANEL
Anion gap: 7 (ref 5–15)
BUN: 60 mg/dL — ABNORMAL HIGH (ref 6–20)
CO2: 21 mmol/L — ABNORMAL LOW (ref 22–32)
Calcium: 8 mg/dL — ABNORMAL LOW (ref 8.9–10.3)
Chloride: 111 mmol/L (ref 98–111)
Creatinine, Ser: 3.32 mg/dL — ABNORMAL HIGH (ref 0.61–1.24)
GFR, Estimated: 20 mL/min — ABNORMAL LOW (ref >60)
Glucose, Bld: 113 mg/dL — ABNORMAL HIGH (ref 70–99)
Potassium: 4.1 mmol/L (ref 3.5–5.1)
Sodium: 139 mmol/L (ref 135–145)

## 2022-03-19 LAB — VITAMIN B12: Vitamin B-12: 321 pg/mL (ref 180–914)

## 2022-03-19 LAB — IRON AND TIBC
Iron: 26 ug/dL — ABNORMAL LOW (ref 45–182)
Saturation Ratios: 12 % — ABNORMAL LOW (ref 17.9–39.5)
TIBC: 220 ug/dL — ABNORMAL LOW (ref 250–450)
UIBC: 194 ug/dL

## 2022-03-19 LAB — GLUCOSE, CAPILLARY
Glucose-Capillary: 124 mg/dL — ABNORMAL HIGH (ref 70–99)
Glucose-Capillary: 122 mg/dL — ABNORMAL HIGH (ref 70–99)
Glucose-Capillary: 148 mg/dL — ABNORMAL HIGH (ref 70–99)
Glucose-Capillary: 176 mg/dL — ABNORMAL HIGH (ref 70–99)

## 2022-03-19 LAB — UREA NITROGEN, URINE: Urea Nitrogen, Ur: 330 mg/dL

## 2022-03-19 LAB — FOLATE: Folate: 9.3 ng/mL (ref >5.9)

## 2022-03-19 LAB — FERRITIN: Ferritin: 235 ng/mL (ref 24–336)

## 2022-03-19 MED ORDER — ALUM & MAG HYDROXIDE-SIMETH 200-200-20 MG/5ML PO SUSP
30.0000 mL | Freq: Once | ORAL | Status: AC
  Administered 2022-03-19: 30 mL via ORAL
  Filled 2022-03-19: qty 30

## 2022-03-19 MED ORDER — LATANOPROST 0.005 % OP SOLN
1.0000 [drp] | Freq: Every day | OPHTHALMIC | Status: DC
  Administered 2022-03-19 – 2022-04-01 (×14): 1 [drp] via OPHTHALMIC
  Filled 2022-03-19: qty 2.5

## 2022-03-19 MED ORDER — SERTRALINE HCL 50 MG PO TABS
50.0000 mg | ORAL_TABLET | ORAL | Status: DC
  Administered 2022-03-19 – 2022-04-02 (×4): 50 mg via ORAL
  Filled 2022-03-19 (×8): qty 1

## 2022-03-19 MED ORDER — PREGABALIN 75 MG PO CAPS
75.0000 mg | ORAL_CAPSULE | Freq: Every day | ORAL | Status: DC
  Administered 2022-03-19 – 2022-04-02 (×15): 75 mg via ORAL
  Filled 2022-03-19 (×15): qty 1

## 2022-03-19 MED ORDER — DORZOLAMIDE HCL 2 % OP SOLN
1.0000 [drp] | Freq: Two times a day (BID) | OPHTHALMIC | Status: DC
  Administered 2022-03-19 – 2022-04-02 (×28): 1 [drp] via OPHTHALMIC
  Filled 2022-03-19: qty 10

## 2022-03-19 MED ORDER — SODIUM BICARBONATE 650 MG PO TABS
1300.0000 mg | ORAL_TABLET | Freq: Two times a day (BID) | ORAL | Status: DC
  Administered 2022-03-19 – 2022-04-02 (×24): 1300 mg via ORAL
  Filled 2022-03-19 (×28): qty 2

## 2022-03-19 MED ORDER — BRIMONIDINE TARTRATE 0.2 % OP SOLN
1.0000 [drp] | Freq: Two times a day (BID) | OPHTHALMIC | Status: DC
  Administered 2022-03-19 – 2022-04-02 (×28): 1 [drp] via OPHTHALMIC
  Filled 2022-03-19: qty 5

## 2022-03-19 MED ORDER — HYOSCYAMINE SULFATE 0.125 MG SL SUBL
0.2500 mg | SUBLINGUAL_TABLET | Freq: Once | SUBLINGUAL | Status: AC
  Administered 2022-03-19: 0.25 mg via SUBLINGUAL
  Filled 2022-03-19: qty 2

## 2022-03-19 MED ORDER — SODIUM CHLORIDE 0.9 % IV SOLN
125.0000 mg | Freq: Once | INTRAVENOUS | Status: AC
  Administered 2022-03-19: 125 mg via INTRAVENOUS
  Filled 2022-03-19: qty 10

## 2022-03-19 MED ORDER — NITROGLYCERIN 0.4 MG SL SUBL: 0.4000 mg | SUBLINGUAL_TABLET | SUBLINGUAL | Status: DC | PRN

## 2022-03-19 NOTE — TOC Progression Note (Addendum)
Transition of Care [facility]) - Progression Note    Patient Details  Name: KEIDEN DESKIN MRN: [id number] Date of Birth: Jan 03, 1961  Transition of Care [facility]) CM/SW Contact  Purcell Mouton, RN Phone Number: 03/19/2022, 12:35 PM  Clinical Narrative:     Pt is from [facility] and plan to return.   Expected Discharge Plan: [place] Barriers to Discharge: No Barriers Identified  Expected Discharge Plan and Services     Post Acute Care Choice: [place] Living arrangements for the past 2 months: [place]                                       Social Determinants of Health (SDOH) Interventions SDOH Screenings   Food Insecurity: No Food Insecurity (03/18/2022)  Housing: Low Risk  (03/18/2022)  Transportation Needs: No Transportation Needs (03/18/2022)  Utilities: Not At Risk (03/18/2022)  Alcohol Screen: Low Risk  (07/30/2017)  Depression (PHQ2-9): Low Risk  (07/10/2020)  Tobacco Use: Medium Risk (03/17/2022)    Readmission Risk Interventions    12/10/2021    8:41 AM 01/12/2020    3:39 PM  Readmission Risk Prevention Plan  Transportation Screening Complete Complete  Palliative Care Screening  Not Applicable  Medication Review ([place]) Complete   PCP or Specialist appointment within 3-5 days of discharge Complete   HRI or [place] Complete   SW Recovery Care/Counseling Consult Complete   Palliative Care Screening Not [place] Complete

## 2022-03-19 NOTE — Progress Notes (Signed)
PROGRESS NOTE   [name]  UYQ:[id number] DOB: 1960/07/22 DOA: 03/17/2022 PCP: Jodi Marble, MD  Brief Narrative:  62 year old black male [place] resident HFpEF EF 60% CKD 4 baseline. 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 Bilateral thyroid nodules Bipolar  Brought over from nursing facility with SOB respiratory distress O2 sats 88% Workup showed BNP 378 COVID influenza RSV all negative CT chest groundglass opacities mostly in lower lobes trace bilateral pleural effusions Admitted for HFpEF in the setting of super morbid obesity  Hospital-Problem based course  Acute hypoxic respiratory failure - Secondary to edema procalcitonin is negative Hold antibiotics  Decompensated HFpEF in the setting of probable hepatorenal syndrome Underlying BPH - Usually is on Lasix 80 twice daily at facility -  diuresis now with IV Lasix at 60 twice daily - Continue Flomax 0.8 make sure bladder scan is done - Strict I/O, urine studies were not appropriately done so we will discard - await nephrology - Continue aspirin 81  Chest pain - Unlikely cardiac - Trial nitroglycerin x 1 and then if no better trial GI cocktail (would only use 1 dose given renal insufficiency) - If no better would get troponin and cycle  CKD 3, hepatorenal syndrome, metabolic acidosis secondary to renal disease - Will resume hydralazine 25 3 times daily and lower dose amlodipine as may be having some underlying angina causing chest pain as above -Continue clonidine 0.1 every 6  -resumed Bicarb 1,300 bid orally  Peripheral vascular disease DM TY 2 A1c 6.1 Prior amputation with decubitus ulce ron it - Strict glycemic control, continue statin continue sliding scale, continue Semglee 10 units (home dose 15) - Sugars less than 120-140 - Continue Lyrica  75 qd - appreciate wound care comment  Normocytic normochromic anemia - iron low sat low, start ferreheme - hold  transfusion at this time  Previous CVA 10/2019 - Secondary prevention with statin etc.    DVT prophylaxis: SCD Code Status: Presumed full Family Communication: None Disposition:  Status is: Observation The patient will require care spanning > 2 midnights and should be moved to inpatient because: Will need diuresis     Subjective: Some chest pain earlier today-radiates to the back on the right side EKG done reviewed by myself shows no ST-T wave changes but some PVCs  Objective: Vitals:   03/18/22 2144 03/19/22 0058 03/19/22 0406 03/19/22 0431  BP: (!) 181/106 (!) 145/87 (!) 156/85   Pulse:  80 80   Resp: '18 18 17   '$ Temp:  98 F (36.7 C) 98.6 F (37 C)   TempSrc:  Oral Oral   SpO2:  98% 94%   Weight:    116.1 kg    Intake/Output Summary (Last 24 hours) at 03/19/2022 1219 Last data filed at 03/19/2022 0100 Gross per 24 hour  Intake 180 ml  Output 1100 ml  Net -920 ml    Filed Weights   03/19/22 0431  Weight: 116.1 kg    Examination:  Coherent pleasant no distress Abdomen is soft Chest is clear no wheeze rales rhonchi Bilateral lower extremity pitting edema ROM is intact Neuro is intact   Data Reviewed: personally reviewed   CBC    Component Value Date/Time   WBC 4.1 03/19/2022 0754   RBC 2.97 (L) 03/19/2022 0754   RBC 2.87 (L) 03/19/2022 0754   HGB 7.5 (L) 03/19/2022 0754   HGB 13.9 12/31/2013 1336   HCT 24.9 (L) 03/19/2022 6387  HCT 43.0 12/31/2013 1336   PLT 162 03/19/2022 0754   PLT 184 12/31/2013 1336   MCV 83.8 03/19/2022 0754   MCV 81 12/31/2013 1336   MCH 25.3 (L) 03/19/2022 0754   MCHC 30.1 03/19/2022 0754   RDW 17.0 (H) 03/19/2022 0754   RDW 14.4 12/31/2013 1336   LYMPHSABS 1.2 03/18/2022 0404   MONOABS 0.4 03/18/2022 0404   EOSABS 0.2 03/18/2022 0404   BASOSABS 0.0 03/18/2022 0404      Latest Ref Rng & Units 03/19/2022    7:54 AM 03/18/2022    4:04 AM 03/17/2022    2:40 PM  CMP  Glucose 70 - 99 mg/dL 113  104  171   BUN 6 - 20  mg/dL 60  53  57   Creatinine 0.61 - 1.24 mg/dL 3.32  3.17  3.22   Sodium 135 - 145 mmol/L 139  139  134   Potassium 3.5 - 5.1 mmol/L 4.1  4.1  4.3   Chloride 98 - 111 mmol/L 111  113  112   CO2 22 - 32 mmol/L '21  20  17   '$ Calcium 8.9 - 10.3 mg/dL 8.0  8.0  7.8   Total Protein 6.5 - 8.1 g/dL  6.4  7.0   Total Bilirubin 0.3 - 1.2 mg/dL  0.4  0.5   Alkaline Phos 38 - 126 U/L  91  115   AST 15 - 41 U/L  10  16   ALT 0 - 44 U/L  15  18      Radiology Studies: ECHOCARDIOGRAM COMPLETE  Result Date: 03/18/2022    ECHOCARDIOGRAM REPORT   Patient Name:   [name] Date of Exam: 03/18/2022 Medical Rec #:  [id number]     Height:       68.0 in Accession #:    [id number]    Weight:       235.4 lb Date of Birth:  January 03, 1961      BSA:          2.191 m Patient Age:    61 years      BP:           182/101 mmHg Patient Gender: M             HR:           77 bpm. Exam Location:  Inpatient Procedure: 2D Echo, Cardiac Doppler and Color Doppler Indications:    R48.54 Acute diastolic (congestive) heart failure  History:        Patient has prior history of Echocardiogram examinations. CHF;                 Risk Factors:Diabetes and Hypertension.  Sonographer:    Phineas Douglas Referring Phys: [phone] [place]  1. Left ventricular ejection fraction, by estimation, is 50 to 55%. The left ventricle has low normal function. The left ventricle has no regional wall motion abnormalities. There is moderate concentric left ventricular hypertrophy. Left ventricular diastolic parameters are consistent with Grade II diastolic dysfunction (pseudonormalization). Elevated left ventricular end-diastolic pressure.  2. Right ventricular systolic function is normal. The right ventricular size is normal. There is normal pulmonary artery systolic pressure.  3. The mitral valve is normal in structure. Trivial mitral valve regurgitation. No evidence of mitral stenosis.  4. The aortic valve is calcified. There is mild calcification of  the aortic valve. There is mild thickening of the aortic valve. Aortic valve regurgitation is mild. Mild aortic valve stenosis.  Aortic valve area, by VTI measures 1.06 cm. Aortic valve mean gradient measures 11.3 mmHg. Aortic valve Vmax measures 2.23 m/s.  5. The inferior vena cava is normal in size with greater than 50% respiratory variability, suggesting right atrial pressure of 3 mmHg. FINDINGS  Left Ventricle: Left ventricular ejection fraction, by estimation, is 50 to 55%. The left ventricle has low normal function. The left ventricle has no regional wall motion abnormalities. The left ventricular internal cavity size was normal in size. There is moderate concentric left ventricular hypertrophy. Left ventricular diastolic parameters are consistent with Grade II diastolic dysfunction (pseudonormalization). Elevated left ventricular end-diastolic pressure. Right Ventricle: The right ventricular size is normal. No increase in right ventricular wall thickness. Right ventricular systolic function is normal. There is normal pulmonary artery systolic pressure. The tricuspid regurgitant velocity is 2.71 m/s, and  with an assumed right atrial pressure of 3 mmHg, the estimated right ventricular systolic pressure is 63.8 mmHg. Left Atrium: Left atrial size was normal in size. Right Atrium: Right atrial size was normal in size. Pericardium: There is no evidence of pericardial effusion. Mitral Valve: The mitral valve is normal in structure. Trivial mitral valve regurgitation. No evidence of mitral valve stenosis. Tricuspid Valve: The tricuspid valve is normal in structure. Tricuspid valve regurgitation is trivial. No evidence of tricuspid stenosis. Aortic Valve: The aortic valve is calcified. There is mild calcification of the aortic valve. There is mild thickening of the aortic valve. Aortic valve regurgitation is mild. Mild aortic stenosis is present. Aortic valve mean gradient measures 11.3 mmHg. Aortic valve peak  gradient measures 20.0 mmHg. Aortic valve area, by VTI measures 1.06 cm. Pulmonic Valve: The pulmonic valve was normal in structure. Pulmonic valve regurgitation is not visualized. No evidence of pulmonic stenosis. Aorta: The aortic root is normal in size and structure. Venous: The inferior vena cava is normal in size with greater than 50% respiratory variability, suggesting right atrial pressure of 3 mmHg. IAS/Shunts: No atrial level shunt detected by color flow Doppler.  LEFT VENTRICLE PLAX 2D LVIDd:         4.10 cm      Diastology LVIDs:         3.00 cm      LV e' medial:    4.30 cm/s LV PW:         1.20 cm      LV E/e' medial:  24.9 LV IVS:        1.40 cm      LV e' lateral:   6.81 cm/s LVOT diam:     2.00 cm      LV E/e' lateral: 15.7 LV SV:         51 LV SV Index:   23 LVOT Area:     3.14 cm  LV Volumes (MOD) LV vol d, MOD A2C: 121.0 ml LV vol d, MOD A4C: 132.0 ml LV vol s, MOD A2C: 47.0 ml LV vol s, MOD A4C: 60.3 ml LV SV MOD A2C:     74.0 ml LV SV MOD A4C:     132.0 ml LV SV MOD BP:      74.0 ml RIGHT VENTRICLE RV S prime:     10.10 cm/s TAPSE (M-mode): 2.0 cm LEFT ATRIUM             Index LA diam:        4.70 cm 2.15 cm/m LA Vol (A2C):   44.4 ml 20.27 ml/m LA Vol (A4C):   68.7 ml  31.36 ml/m LA Biplane Vol: 58.8 ml 26.84 ml/m  AORTIC VALVE AV Area (Vmax):    1.10 cm AV Area (Vmean):   1.12 cm AV Area (VTI):     1.06 cm AV Vmax:           223.33 cm/s AV Vmean:          155.000 cm/s AV VTI:            0.481 m AV Peak Grad:      20.0 mmHg AV Mean Grad:      11.3 mmHg LVOT Vmax:         78.30 cm/s LVOT Vmean:        55.400 cm/s LVOT VTI:          0.163 m LVOT/AV VTI ratio: 0.34  AORTA Ao Root diam: 3.20 cm Ao Asc diam:  3.30 cm MITRAL VALVE                TRICUSPID VALVE MV Area (PHT): 3.27 cm     TR Peak grad:   29.4 mmHg MV Decel Time: 232 msec     TR Vmax:        271.00 cm/s MV E velocity: 107.00 cm/s MV A velocity: 102.00 cm/s  SHUNTS MV E/A ratio:  1.05         Systemic VTI:  0.16 m                              Systemic Diam: 2.00 cm Skeet Latch MD Electronically signed by Skeet Latch MD Signature Date/Time: 03/18/2022/11:08:06 PM    Final    VAS [place] UPPER EXTREMITY VENOUS DUPLEX  Result Date: 03/18/2022 UPPER VENOUS STUDY  Patient Name:  [name]  Date of Exam:   03/18/2022 Medical Rec #: [id number]      Accession #:    [id number] Date of Birth: 11/17/1960       Patient Gender: M Patient Age:   21 years Exam Location:  [facility] Procedure:      VAS [place] UPPER EXTREMITY VENOUS DUPLEX Referring Phys: Nita Sells --------------------------------------------------------------------------------  Indications: Swelling Risk Factors: None identified. Limitations: Poor ultrasound/tissue interface. Comparison Study: No prior studies. Performing Technologist: Oliver Hum RVT  Examination Guidelines: A complete evaluation includes B-mode imaging, spectral Doppler, color Doppler, and power Doppler as needed of all accessible portions of each vessel. Bilateral testing is considered an integral part of a complete examination. Limited examinations for reoccurring indications may be performed as noted.  Right Findings: +----------+------------+---------+-----------+----------+-------+ RIGHT     CompressiblePhasicitySpontaneousPropertiesSummary +----------+------------+---------+-----------+----------+-------+ IJV           Full       Yes       Yes                      +----------+------------+---------+-----------+----------+-------+ Subclavian    Full       Yes       Yes                      +----------+------------+---------+-----------+----------+-------+ Axillary      Full       Yes       Yes                      +----------+------------+---------+-----------+----------+-------+ Brachial      Full       Yes  Yes                      +----------+------------+---------+-----------+----------+-------+ Radial        Full                                           +----------+------------+---------+-----------+----------+-------+ Ulnar         Full                                          +----------+------------+---------+-----------+----------+-------+ Cephalic      Full                                          +----------+------------+---------+-----------+----------+-------+ Basilic       Full                                          +----------+------------+---------+-----------+----------+-------+  Left Findings: +----------+------------+---------+-----------+----------+-------+ LEFT      CompressiblePhasicitySpontaneousPropertiesSummary +----------+------------+---------+-----------+----------+-------+ Subclavian    Full       Yes       Yes                      +----------+------------+---------+-----------+----------+-------+  Summary:  Right: No evidence of deep vein thrombosis in the upper extremity. No evidence of superficial vein thrombosis in the upper extremity.  Left: No evidence of thrombosis in the subclavian.  *See table(s) above for measurements and observations.  Diagnosing physician: Harold Barban MD Electronically signed by Harold Barban MD on 03/18/2022 at 11:33:14 AM.    Final    CT Chest Wo Contrast  Result Date: 03/17/2022 CLINICAL DATA:  Pneumonia. EXAM: CT CHEST WITHOUT CONTRAST TECHNIQUE: Multidetector CT imaging of the chest was performed following the standard protocol without IV contrast. RADIATION DOSE REDUCTION: This exam was performed according to the departmental dose-optimization program which includes automated exposure control, adjustment of the mA and/or kV according to patient size and/or use of iterative reconstruction technique. COMPARISON:  Chest x-ray same day.  CT angiogram chest 10/19/2021. FINDINGS: Cardiovascular: Heart is mildly enlarged. There is a small pericardial effusion. Aorta is normal in size. There are atherosclerotic calcifications of the aorta. Mediastinum/Nodes:  Multiple thyroid nodules are present bilaterally measuring up to 2.2 cm on the left image 2/17, similar to prior. No definite enlarged mediastinal, hilar or axillary lymph nodes allowing for lack of intravenous contrast. The esophagus is nondilated. Lungs/Pleura: There are diffuse patchy ground-glass opacities throughout both lungs most significant in the bilateral upper lobes. Additional upper lobe airspace opacities are present. There is some smooth interlobular septal thickening predominantly in the upper lobes. There are trace bilateral pleural effusions. There is a 6 mm nodular density in the left upper lobe image 7/71, new from prior. No pneumothorax. Trachea and central airways patent as can be seen. Upper Abdomen: Gallstone present. Musculoskeletal: No acute fractures are seen. There is bilateral gynecomastia and mild body wall edema. IMPRESSION: 1. Diffuse patchy ground-glass opacities throughout both lungs most significant in the bilateral upper lobes. There is some smooth interlobular septal thickening in the  upper lobes. Findings are favored as infectious/inflammatory process. 2. Trace bilateral pleural effusions. 3. 6 mm left solid pulmonary nodule within the upper lobe. Per Fleischner Society Guidelines, recommend a non-contrast Chest CT at 6-12 months. If patient is high risk for malignancy, consider an additional non-contrast Chest CT at 18-24 months. If patient is low risk for malignancy, non-contrast Chest CT at 18-24 months is optional. These guidelines do not apply to immunocompromised patients and patients with cancer. Follow up in patients with significant comorbidities as clinically warranted. For lung cancer screening, adhere to Lung-RADS guidelines. Reference: Radiology. 2017; 284(1):228-43. 4. Mild cardiomegaly with small pericardial effusion. 5. Body wall edema. 6. Cholelithiasis. 7. Thyroid nodules measuring up to 2.2 cm. Follow-up ultrasound recommended. Aortic Atherosclerosis  (ICD10-I70.0). Electronically Signed   By: Ronney Asters M.D.   On: 03/17/2022 16:26   DG Chest Portable 1 View  Result Date: 03/17/2022 CLINICAL DATA:  Shortness of breath. EXAM: PORTABLE CHEST 1 VIEW COMPARISON:  12/08/2021 FINDINGS: 1459 hours. Low volume film. The cardio pericardial silhouette is enlarged. Diffuse hazy bilateral airspace opacity noted with some sparing at the right base. The visualized bony structures of the thorax are unremarkable. Telemetry leads overlie the chest. IMPRESSION: Low volume film with diffuse hazy bilateral airspace opacity. Imaging features suggest pulmonary edema although diffuse infection not excluded. Electronically Signed   By: Misty Stanley M.D.   On: 03/17/2022 15:09     Scheduled Meds:  aspirin  81 mg Oral Daily   carvedilol  3.125 mg Oral BID WC   furosemide  60 mg Intravenous Q12H   heparin  5,000 Units Subcutaneous Q8H   insulin aspart  0-5 Units Subcutaneous QHS   insulin aspart  0-9 Units Subcutaneous TID WC   insulin glargine-yfgn  10 Units Subcutaneous QHS   pantoprazole  40 mg Oral Daily   tamsulosin  0.8 mg Oral QPM   Continuous Infusions:   LOS: 1 day   Time spent: 40  Nita Sells, MD [facility] To contact the attending provider between 7A-7P or the covering provider during after hours 7P-7A, please log into the web site [email or web address] and access using universal [place] password for that web site. If you do not have the password, please call the hospital operator.  03/19/2022, 12:19 PM

## 2022-03-19 NOTE — NC FL2 (Signed)
[place] LEVEL OF CARE FORM     IDENTIFICATION  Patient Name: [name] Birthdate: [birth year] Sex: male Admission Date (Current Location): 03/17/2022  [facility] and [place] Number:  Herbalist and Address:  [facility],  [place], [place]      Provider Number: [id number]  Attending Physician Name and Address:  Nita Sells, MD  Relative Name and Phone Number:  [place], Schroll   [phone]    Current Level of Care: Hospital Recommended Level of Care: [place] Prior Approval Number:    Date Approved/Denied:   PASRR Number: [id number]  Discharge Plan: SNF    Current Diagnoses: Patient Active Problem List   Diagnosis Date Noted   Acute on chronic diastolic CHF (congestive heart failure) ([place]) 03/17/2022   Acute respiratory failure with hypoxia ([place]) 03/17/2022   History of left below knee amputation ([place]) 02/25/2022   Swelling of arm 10/24/2021   Multiple thyroid nodules 10/19/2021   Chronic heart failure with preserved ejection fraction (HFpEF) ([place]) 10/19/2021   Diabetic foot infection ([place]) 03/01/2021   PAD (peripheral artery disease) ([place]) 03/01/2021   Glaucoma (increased eye pressure) 03/01/2021   Pressure injury of skin 11/19/2020   Depression with anxiety 11/18/2020   BPH (benign prostatic hyperplasia) 11/18/2020   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 08/22/2020   TIA (transient ischemic attack) 07/03/2020   Pulmonary artery hypertension ([place]) 06/25/2020   CKD (chronic kidney disease) stage 4, GFR 15-29 ml/min ([place]) 06/25/2020   History of cerebrovascular accident (CVA) with residual deficit 06/25/2020   Morbid (severe) obesity due to excess calories ([place]) 03/30/2020   Malnutrition of moderate degree 01/25/2020   Palliative care by specialist    Goals of care, counseling/discussion    Nephrotic syndrome 01/06/2020   Anasarca 01/06/2020   Generalized body aches  01/01/2020   Chronic diastolic CHF (congestive heart failure) ([place]) 01/01/2020   Iron deficiency anemia 01/01/2020   Uncontrolled type 2 diabetes mellitus with hyperglycemia ([place])    Pure hypercholesterolemia    Nonobstructive atherosclerosis of coronary artery 09/28/2017   MDD (major depressive disorder), recurrent episode, severe ([place]) 07/30/2017   Osteoarthritis 04/12/2015   Substance-related disorder ([place]) 10/30/2014   Patient's noncompliance with other medical treatment and regimen 06/07/2014   Chronic pain 05/31/2014   History of noncompliance with medical treatment 05/31/2014   Malingering 05/31/2014   Diabetic neuropathy associated with diabetes mellitus due to underlying condition ([place]) 05/31/2014   Depression, major, recurrent, severe with psychosis ([place]) 08/29/2013   Type 2 diabetes mellitus with stage 4 chronic kidney disease and hypertension ([place]) 06/30/2013   Essential hypertension 12/09/2011    Orientation RESPIRATION BLADDER Height & Weight     Situation, Place, Time, Self  Normal Continent Weight: 116.1 kg Height:     BEHAVIORAL SYMPTOMS/MOOD NEUROLOGICAL BOWEL NUTRITION STATUS      Continent Diet (Renal/Carb modified)  AMBULATORY STATUS COMMUNICATION OF NEEDS Skin   Limited Assist Verbally Surgical wounds (left leg)                       Personal Care Assistance Level of Assistance  Bathing, Feeding, Dressing Bathing Assistance: Limited assistance Feeding assistance: Limited assistance Dressing Assistance: Limited assistance     Functional Limitations Info  Sight, Hearing, Speech Sight Info: Impaired Hearing Info: Adequate Speech Info: Adequate    SPECIAL CARE FACTORS FREQUENCY  PT (By licensed PT), OT (By licensed OT)     PT Frequency:  Eval and treat OT Frequency: Eval and treat            Contractures Contractures Info: Not present    Additional Factors Info  Code Status, Allergies Code Status Info: FULL Allergies Info: Other            Current Medications (03/19/2022):  This is the current hospital active medication list Current Facility-Administered Medications  Medication Dose Route Frequency Provider Last Rate Last Admin   acetaminophen (TYLENOL) tablet 650 mg  650 mg Oral Q6H PRN Kristopher Oppenheim, DO       Or   acetaminophen (TYLENOL) suppository 650 mg  650 mg Rectal Q6H PRN Kristopher Oppenheim, DO       alum & mag hydroxide-simeth (MAALOX/MYLANTA) 200-200-20 MG/5ML suspension 30 mL  30 mL Oral Once Nita Sells, MD       aspirin chewable tablet 81 mg  81 mg Oral Daily Kristopher Oppenheim, DO   81 mg at 03/19/22 0956   brimonidine (ALPHAGAN) 0.2 % ophthalmic solution 1 drop  1 drop Both Eyes BID Nita Sells, MD       carvedilol (COREG) tablet 3.125 mg  3.125 mg Oral BID WC Kristopher Oppenheim, DO   3.125 mg at 03/19/22 0957   dorzolamide (TRUSOPT) 2 % ophthalmic solution 1 drop  1 drop Both Eyes BID Nita Sells, MD       ferric gluconate (FERRLECIT) 125 mg in sodium chloride 0.9 % 100 mL IVPB  125 mg Intravenous Once Nita Sells, MD       furosemide (LASIX) injection 60 mg  60 mg Intravenous Q12H Nita Sells, MD   60 mg at 03/19/22 0956   heparin injection 5,000 Units  5,000 Units Subcutaneous Q8H Kristopher Oppenheim, DO   5,000 Units at 03/19/22 6761   hyoscyamine (LEVSIN SL) SL tablet 0.25 mg  0.25 mg Sublingual Once Nita Sells, MD       insulin aspart (novoLOG) injection 0-5 Units  0-5 Units Subcutaneous QHS Kristopher Oppenheim, DO       insulin aspart (novoLOG) injection 0-9 Units  0-9 Units Subcutaneous TID WC Kristopher Oppenheim, DO   1 Units at 03/19/22 1156   insulin glargine-yfgn (SEMGLEE) injection 10 Units  10 Units Subcutaneous QHS Kristopher Oppenheim, DO   10 Units at 03/18/22 2147   latanoprost (XALATAN) 0.005 % ophthalmic solution 1 drop  1 drop Both Eyes QHS Nita Sells, MD       metoprolol tartrate (LOPRESSOR) injection 5 mg  5 mg Intravenous Q6H PRN Nita Sells, MD   5 mg at 03/18/22 1803    nitroGLYCERIN (NITROSTAT) SL tablet 0.4 mg  0.4 mg Sublingual Q5 min PRN Nita Sells, MD       ondansetron (ZOFRAN) tablet 4 mg  4 mg Oral Q6H PRN Kristopher Oppenheim, DO       Or   ondansetron [facility]) injection 4 mg  4 mg Intravenous Q6H PRN Kristopher Oppenheim, DO       pantoprazole (PROTONIX) EC tablet 40 mg  40 mg Oral Daily Kristopher Oppenheim, DO   40 mg at 03/19/22 0956   pregabalin (LYRICA) capsule 75 mg  75 mg Oral Daily Nita Sells, MD       sertraline (ZOLOFT) tablet 50 mg  50 mg Oral QODAY Samtani, Jai-Gurmukh, MD       sodium bicarbonate tablet 1,300 mg  1,300 mg Oral BID Nita Sells, MD       tamsulosin (FLOMAX) capsule 0.8 mg  0.8 mg Oral QPM Kristopher Oppenheim,  DO   0.8 mg at 03/18/22 3142     Discharge Medications: Please see discharge summary for a list of discharge medications.  Relevant Imaging Results:  Relevant Lab Results:   Additional Information SS#[id number]  Purcell Mouton, RN

## 2022-03-19 NOTE — Consult Note (Signed)
Palliative Care Consult Note                                  Date: 03/19/2022   Patient Name: [name]  DOB: 1960-11-15  MRN: [id number]  Age / Sex: 62 y.o., male  PCP: Jodi Marble, MD Referring Physician: Nita Sells, MD  Reason for Consultation: Establishing goals of care  HPI/Patient Profile: 62 y.o. male  with past medical history of HFpEF, CKD stage 4, peripheral vascular disease s/p Left BKA, previous CVA 10/2019, and anemia who presented to the ED on 03/17/2022 with shortness of breath. He was admitted for acute hypoxic respiratory failure and decompensated HFpEF in the setting of probable hepatorenal syndrome.   Palliative Medicine was consulted for goals of care.    Past Medical History:  Diagnosis Date   Acute on chronic heart failure with preserved ejection fraction (HCC)    Acute renal failure superimposed on stage 3a chronic kidney disease ([place]) 11/18/2020   Arthritis    Bacteremia due to Enterococcus 12/09/2021   Bipolar affective ([place])    CHF (congestive heart failure) ([place])    Claustrophobia    Severe   Cocaine abuse ([place]) 05/31/2014   Cocaine dependence, uncomplicated ([place]) 35/57/3220   Depression    Diabetes mellitus    uncontrolled    Elevated CK    Heart murmur    Hypertension    Ileus ([place]) 25/42/7062   Metabolic acidosis 37/62/8315   Non-traumatic rhabdomyolysis 01/01/2020   Pericardial effusion 08/22/2020   Severe recurrent major depression with psychotic features ([place]) 01/04/2015   Suicidal ideations 08/28/2013    Subjective:   I have reviewed medical records including progress notes, labs and imaging, and met with patient to discuss diagnosis, prognosis, and goals of care.  Patient is known to PMT from previous hospitalizations in January 2023 and August 2023. I re-introduced Palliative Medicine as specialized medical care for people living with serious illness. It focuses on  providing relief from the symptoms and stress of a serious illness.   A brief life review was discussed. [name] is still legally married to his wife for 36 years, but they have been separated for years. They did not have children together but he helped raise her 4 children from a previous relationship.   [name] tells me he has been a resident of [facility] since July 2021. He states "this is when my life changed". He uses a lift to transfer from bed to wheelchair, but then is able to wheel himself around the facility.   We discussed his current illness and what it means in the larger context of his ongoing co-morbidities. Discussed his multiple medical problems including heart failure, CKD, and peripheral vascular disease. Offered education on the natural trajectory of chronic illness. Bonnie does not seem to be understanding that his chronic illnesses will get worse over time.   Created space and opportunity for patient to express thoughts regarding his current medical situation. Values and goals of care were attempted to be elicited.  [name] states that his goal is to stabilize and improve. He hopes to be able to walk again one day. He is motivated to work with PT and was learning to use a prosthesis, but this has been put on hold due to a wound on the Left BKA stump. His short-term goal is to be able to see his father for his 57th birthday in  June. His long-term goal is live independently again someday. [name] shares that his greatest worry is "being at the nursing home forever".  A discussion was had today regarding advanced directives. [name] would want his brother [name] to make medical decisions for him if he were unable to make those decisions for himself. We did discuss code status. I encouraged consideration of DNR/DNI status, understanding prognosis would be poor in the event of cardiac arrest. [name] wishes to remain full code and is interested in any available and offered medical interventions  to prolong life.   Questions and concerns addressed. Patient/family encouraged to call with questions or concerns.     Review of Systems  Respiratory:  Positive for shortness of breath.     Objective:   Primary Diagnoses: Present on Admission:  Acute on chronic diastolic CHF (congestive heart failure) (HCC)  CVA (HCC) with residual right-sided weakness  CKD (chronic kidney disease) stage 4, GFR 15-29 ml/min (HCC)  Morbid (severe) obesity due to excess calories [facility])  Essential hypertension   Physical Exam Vitals reviewed.  Constitutional:      General: He is not in acute distress.    Comments: Chronically ill-appearing  Pulmonary:     Effort: Pulmonary effort is normal.  Musculoskeletal:     Left Lower Extremity: Left leg is amputated below knee.  Neurological:     Mental Status: He is alert and oriented to person, place, and time.  Psychiatric:        Behavior: Behavior normal.     Vital Signs:  BP (!) 156/85 (BP Location: Left Arm)   Pulse 80   Temp 98.6 F (37 C) (Oral)   Resp 17   Wt 116.1 kg   SpO2 94%   BMI 38.92 kg/m   Palliative Assessment/Data: PPS 40-50%     Assessment & Plan:   SUMMARY OF RECOMMENDATIONS   Full scope interventions Goal of care is for medical stabilization and improvement Ongoing education about the trajectory of chronic illness Spiritual care consult for assistance with advanced directives PMT will continue to follow  Primary Decision Maker: PATIENT He designates his brother Andrei Mccook as health agent  Code Status/Advance Care Planning: Full code  Prognosis:  Unable to determine  Discharge Planning:  SNF   Thank you for allowing [place] to participate in the care of [name]  MDM - High  Signed by: Elie Confer, NP Palliative Medicine Team  Team Phone # [phone]  For individual providers, please see AMION

## 2022-03-20 ENCOUNTER — Encounter (HOSPITAL_COMMUNITY): Payer: Self-pay | Admitting: Family Medicine

## 2022-03-20 ENCOUNTER — Inpatient Hospital Stay (HOSPITAL_COMMUNITY): Payer: Medicare (Managed Care)

## 2022-03-20 DIAGNOSIS — J9601 Acute respiratory failure with hypoxia: Secondary | ICD-10-CM | POA: Diagnosis not present

## 2022-03-20 DIAGNOSIS — I5033 Acute on chronic diastolic (congestive) heart failure: Secondary | ICD-10-CM | POA: Diagnosis not present

## 2022-03-20 LAB — BASIC METABOLIC PANEL
Anion gap: 7 (ref 5–15)
BUN: 56 mg/dL — ABNORMAL HIGH (ref 6–20)
CO2: 22 mmol/L (ref 22–32)
Calcium: 7.9 mg/dL — ABNORMAL LOW (ref 8.9–10.3)
Chloride: 109 mmol/L (ref 98–111)
Creatinine, Ser: 3.01 mg/dL — ABNORMAL HIGH (ref 0.61–1.24)
GFR, Estimated: 23 mL/min — ABNORMAL LOW (ref >60)
Glucose, Bld: 100 mg/dL — ABNORMAL HIGH (ref 70–99)
Potassium: 4.1 mmol/L (ref 3.5–5.1)
Sodium: 138 mmol/L (ref 135–145)

## 2022-03-20 LAB — CBC
HCT: 25.4 % — ABNORMAL LOW (ref 39.0–52.0)
Hemoglobin: 7.9 g/dL — ABNORMAL LOW (ref 13.0–17.0)
MCH: 25.7 pg — ABNORMAL LOW (ref 26.0–34.0)
MCHC: 31.1 g/dL (ref 30.0–36.0)
MCV: 82.7 fL (ref 80.0–100.0)
Platelets: 167 10*3/uL (ref 150–400)
RBC: 3.07 MIL/uL — ABNORMAL LOW (ref 4.22–5.81)
RDW: 16.5 % — ABNORMAL HIGH (ref 11.5–15.5)
WBC: 4 10*3/uL (ref 4.0–10.5)
nRBC: 0 % (ref 0.0–0.2)

## 2022-03-20 LAB — URINALYSIS, ROUTINE W REFLEX MICROSCOPIC
Bacteria, UA: NONE SEEN
Bilirubin Urine: NEGATIVE
Glucose, UA: 50 mg/dL — AB
Hgb urine dipstick: NEGATIVE
Ketones, ur: NEGATIVE mg/dL
Leukocytes,Ua: NEGATIVE
Nitrite: NEGATIVE
Protein, ur: >=300 mg/dL — AB
Specific Gravity, Urine: 1.009 (ref 1.005–1.030)
pH: 5 (ref 5.0–8.0)

## 2022-03-20 LAB — SODIUM, URINE, RANDOM: Sodium, Ur: 99 mmol/L

## 2022-03-20 LAB — GLUCOSE, CAPILLARY
Glucose-Capillary: 110 mg/dL — ABNORMAL HIGH (ref 70–99)
Glucose-Capillary: 117 mg/dL — ABNORMAL HIGH (ref 70–99)
Glucose-Capillary: 150 mg/dL — ABNORMAL HIGH (ref 70–99)
Glucose-Capillary: 139 mg/dL — ABNORMAL HIGH (ref 70–99)

## 2022-03-20 MED ORDER — FUROSEMIDE 10 MG/ML IJ SOLN
80.0000 mg | Freq: Three times a day (TID) | INTRAMUSCULAR | Status: DC
  Administered 2022-03-20 – 2022-03-22 (×6): 80 mg via INTRAVENOUS
  Filled 2022-03-20 (×6): qty 8

## 2022-03-20 MED ORDER — SODIUM CHLORIDE 0.9 % IV SOLN
250.0000 mg | INTRAVENOUS | Status: AC
  Administered 2022-03-21 – 2022-03-23 (×2): 250 mg via INTRAVENOUS
  Filled 2022-03-20 (×2): qty 20

## 2022-03-20 MED ORDER — AMLODIPINE BESYLATE 5 MG PO TABS
5.0000 mg | ORAL_TABLET | Freq: Every day | ORAL | Status: DC
  Administered 2022-03-20 – 2022-03-23 (×4): 5 mg via ORAL
  Filled 2022-03-20 (×4): qty 1

## 2022-03-20 MED ORDER — HYDRALAZINE HCL 10 MG PO TABS: 10.0000 mg | ORAL_TABLET | Freq: Four times a day (QID) | ORAL | Status: DC | PRN

## 2022-03-20 NOTE — Progress Notes (Signed)
[facility] Progress Note  Subjective: seen in room, no c/o  Vitals:   03/19/22 0431 03/19/22 1927 03/19/22 2034 03/20/22 0431  BP:  (!) 193/99 (!) 172/95 (!) 176/91  Pulse:  83 76 73  Resp:  '16 16 18  '$ Temp:  (!) 97.5 F (36.4 C) 97.7 F (36.5 C) 97.9 F (36.6 C)  TempSrc:  Oral Oral Oral  SpO2:  99% 100% 99%  Weight: 116.1 kg       Exam: Gen alert, no distress No jvd or bruits Chest bilat basilar rales RRR no MRG Abd soft ntnd no mass or ascites +bs Ext diffuse 2-3+ pitting hip/ abd wall/ stump edema Neuro is alert, Ox 3 , R hemiparesis chronic       Home meds include - albuterol, norvasc 10, aspirin, lipitor, catapres patch 0.1, colace, lasix 80 bid, hydralazine 25 tid, insulin aspart/ glargine, nepro, protonix, klorcon 20, lyrica, zoloft, sod bicarb      Date                          Creat               eGFR    2021                         1.10- 2.31    2022                         1.25- 2.07 Mar 2021                  2.90 >> 1.21    Aug- sept 2023        1.97- 3.79    Oct 2023                  2.53- 2.61        27- 29 ml/min      1/21/ 24                    3.22     03/18/22                    3.17     UA > 300 prot, 0-5 rbc/ wbc   UNa 99, UCr 51    CXR - pulm edema, CM    Renal US - pend     Assessment/ Plan: AKI on CKD 4 - b/l creat 2.5- 2.6 in Oct 2023, eGFR 27-29 ml/min. Creat here 3.1 on admission in setting of decomp CHF w/ anasarca and pulm edema. AKI due to decomp CHF vs progression of CKD. D#3 of IV lasix, will ^ to '80mg'$  tid and titrate further as needed. No uremic sxs and no need for RRT at this time. Get renal US. Will follow.  PAD/  bilat LE amputee - gets around in a wheelchair Vol overload/ decomp diast CHF - as above Anemia - due in part to CKD most likely. Tsat 12%, will give additional IV iron '250mg'$  x 2 while here AHRF - cont diuresing DM2 H/o CVA - w/ R hemiparesis     Rob Edra Riccardi 03/20/2022, 11:18 AM   Recent Labs   Lab 03/17/22 1440 03/18/22 0404 03/19/22 0754  HGB 8.1* 7.4* 7.5*  ALBUMIN 2.7* 2.5*  --   CALCIUM 7.8* 8.0* 8.0*  CREATININE 3.22* 3.17* 3.32*  K 4.3  4.1 4.1   Recent Labs  Lab 03/19/22 0754  IRON 26*  TIBC 220*  FERRITIN 235   Inpatient medications:  aspirin  81 mg Oral Daily   brimonidine  1 drop Both Eyes BID   carvedilol  3.125 mg Oral BID WC   dorzolamide  1 drop Both Eyes BID   furosemide  80 mg Intravenous Q8H   heparin  5,000 Units Subcutaneous Q8H   insulin aspart  0-5 Units Subcutaneous QHS   insulin aspart  0-9 Units Subcutaneous TID WC   insulin glargine-yfgn  10 Units Subcutaneous QHS   latanoprost  1 drop Both Eyes QHS   pantoprazole  40 mg Oral Daily   pregabalin  75 mg Oral Daily   sertraline  50 mg Oral QODAY   sodium bicarbonate  1,300 mg Oral BID   tamsulosin  0.8 mg Oral QPM    [START ON 03/21/2022] ferric gluconate (FERRLECIT) IVPB     acetaminophen **OR** acetaminophen, metoprolol tartrate, nitroGLYCERIN, ondansetron **OR** ondansetron (ZOFRAN) IV

## 2022-03-20 NOTE — TOC Progression Note (Signed)
Transition of Care [facility]) - Progression Note    Patient Details  Name: [name] MRN: [id number] Date of Birth: [birth year]  Transition of Care [facility]) CM/SW Contact  Malaya Cagley, Juliann Pulse, RN Phone Number: 03/20/2022, 12:18 PM  Clinical Narrative:  From [place] rep Chelsea(care coordinator) following for return. Await medical stability.     Expected Discharge Plan: Long Term Nursing Home Barriers to Discharge: Continued Medical Work up  Expected Discharge Plan and Services     Post Acute Care Choice: [place] Living arrangements for the past 2 months: [place]                                       Social Determinants of Health (SDOH) Interventions SDOH Screenings   Food Insecurity: No Food Insecurity (03/18/2022)  Housing: Low Risk  (03/18/2022)  Transportation Needs: No Transportation Needs (03/18/2022)  Utilities: Not At Risk (03/18/2022)  Alcohol Screen: Low Risk  (07/30/2017)  Depression (PHQ2-9): Low Risk  (07/10/2020)  Tobacco Use: Medium Risk (03/20/2022)    Readmission Risk Interventions    12/10/2021    8:41 AM 01/12/2020    3:39 PM  Readmission Risk Prevention Plan  Transportation Screening Complete Complete  Palliative Care Screening  Not Applicable  Medication Review ([place]) Complete   PCP or Specialist appointment within 3-5 days of discharge Complete   HRI or [place] Complete   SW Recovery Care/Counseling Consult Complete   Palliative Care Screening Not [place] Complete

## 2022-03-20 NOTE — Progress Notes (Signed)
Chaplain tried to engage in an initial visit with [name] to go over [name], Advanced Directive document as consulted.  He was sleeping at time of visit.  Chaplain will follow-up tomorrow.     03/20/22 1400  Spiritual Encounters  Type of Visit Initial  Care provided to: Pt not available

## 2022-03-20 NOTE — Consult Note (Addendum)
Renal Service Consult Note [facility]  [name] 03/20/2022 Sol Blazing, MD Requesting Physician: Dr Aileen Fass  Reason for Consult: Renal failure HPI: The patient is a 62 y.o. year-old w/ PMH as below who presented to ED 1/21 with c/o SOB and leg/ arm swelling. 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 Lives in SNF. CT chest showed pulm edema and body wall edema. Pt admitted and started on IV lasix. Creat is higher than usual so we are asked to see for renal failure.   Pt seen in room, main c/o is leg/ abd wall swelling, SOB somewhat better now. /O are net negative 2 L since admit. Pt states he moved to [place] from [place] area. Says he knows about his kidney failure but denies having a kidney doctor either in [place] or in [facility].    ROS - denies CP, no joint pain, no HA, no blurry vision, no rash, no diarrhea, no nausea/ vomiting, no dysuria, no difficulty voiding   Past Medical History  Past Medical History:  Diagnosis Date   Acute on chronic diastolic CHF (congestive heart failure) ([place]) 08/22/2020   Acute on chronic heart failure with preserved ejection fraction (H[facility])    Acute renal failure superimposed on stage 3a chronic kidney disease ([place]) 11/18/2020   Arthritis    Bacteremia due to Enterococcus 12/09/2021   Bipolar affective ([place])    CHF (congestive heart failure) ([place])    Claustrophobia    Severe   Cocaine abuse ([place]) 05/31/2014   Cocaine dependence, uncomplicated ([place]) 45/36/4680   Depression    Diabetes mellitus    uncontrolled    Elevated CK    Heart murmur    Hypertension    Ileus ([place]) 32/01/2481   Metabolic acidosis 50/04/7046   Non-traumatic rhabdomyolysis 01/01/2020   Pericardial effusion 08/22/2020   Severe recurrent major depression with psychotic features ([place]) 01/04/2015   Suicidal ideations 08/28/2013   Past Surgical History  Past Surgical History:  Procedure Laterality Date   ABDOMINAL AORTOGRAM W/LOWER EXTREMITY Left 03/02/2021    Procedure: ABDOMINAL AORTOGRAM W/LOWER EXTREMITY;  Surgeon: Cherre Robins, MD;  Location: [facility];  Service: Cardiovascular;  Laterality: Left;   AMPUTATION Left 03/07/2021   Procedure: LEFT BELOW KNEE AMPUTATION;  Surgeon: Cherre Robins, MD;  Location: [facility];  Service: Vascular;  Laterality: Left;   APPLICATION OF WOUND VAC Left 03/07/2021   Procedure: APPLICATION OF WOUND VAC;  Surgeon: Cherre Robins, MD;  Location: [facility];  Service: Vascular;  Laterality: Left;   BOWEL DECOMPRESSION N/A 01/19/2020   Procedure: BOWEL DECOMPRESSION;  Surgeon: Otis Brace, MD;  Location: [facility];  Service: Gastroenterology;  Laterality: N/A;   BOWEL DECOMPRESSION N/A 11/19/2020   Procedure: BOWEL DECOMPRESSION;  Surgeon: Wilford Corner, MD;  Location: [facility];  Service: Endoscopy;  Laterality: N/A;   BOWEL DECOMPRESSION N/A 12/03/2020   Procedure: BOWEL DECOMPRESSION;  Surgeon: Ronnette Juniper, MD;  Location: [facility];  Service: Gastroenterology;  Laterality: N/A;   COLONOSCOPY WITH PROPOFOL N/A 01/19/2020   Procedure: COLONOSCOPY WITH PROPOFOL;  Surgeon: Otis Brace, MD;  Location: [facility];  Service: Gastroenterology;  Laterality: N/A;   COLONOSCOPY WITH PROPOFOL N/A 11/19/2020   Procedure: COLONOSCOPY WITH PROPOFOL;  Surgeon: Wilford Corner, MD;  [facility]: [facility];  Service: Endoscopy;  Laterality: N/A;   COLONOSCOPY WITH PROPOFOL N/A 12/03/2020   Procedure: COLONOSCOPY WITH PROPOFOL;  Surgeon: Ronnette Juniper, MD;  [facility];  Service: Gastroenterology;  Laterality: N/A;  KNEE SURGERY     b/l knees; pending total knee    RADIOLOGY WITH ANESTHESIA N/A 07/04/2020   Procedure: MRI WITH ANESTHESIA BRAIN WITHOUT CONTRAST AND HEAD WITHOUT CONTRAST;  Surgeon: Radiologist, Medication, MD;  Location: [facility];  Service: Radiology;  Laterality: N/A;   TRANSMETATARSAL AMPUTATION Left 03/01/2021   Procedure: TRANSMETATARSAL AMPUTATION;  Surgeon: Trula Slade, DPM;  Location: [facility];  Service: Podiatry;  Laterality: Left;   Family History  Family History  Problem Relation Age of Onset   Diabetes Other    Cancer Mother        uterus; deceased    Heart attack Mother 19   Hypertension Other    Heart attack Father 81   Stroke Sister        12/2012 age 42 y.o   Social History  reports that he has quit smoking. He has never used smokeless tobacco. He reports that he does not currently use drugs after having used the following drugs: Cocaine. He reports that he does not drink alcohol. Allergies  Allergies  Allergen Reactions   Other Anaphylaxis, Itching, Swelling and Other (See Comments)    Grits - swelling   Home medications Prior to Admission medications   Medication Sig Start Date End Date Taking? Authorizing Provider  acetaminophen (TYLENOL) 650 MG CR tablet Take 650 mg by mouth every 4 (four) hours as needed for pain.   Yes [provider]  albuterol (VENTOLIN HFA) 108 (90 Base) MCG/ACT inhaler Inhale 2 puffs into the lungs every 6 (six) hours as needed for wheezing or shortness of breath.   Yes [provider]  amLODipine (NORVASC) 10 MG tablet Take 1 tablet (10 mg total) by mouth daily. 12/11/21  Yes [place] Donnamarie Poag, DO  ascorbic acid (VITAMIN C) 500 MG tablet Take 500 mg by mouth 2 (two) times daily.   Yes [provider]  aspirin 81 MG chewable tablet Chew 81 mg by mouth daily.   Yes [provider]  atorvastatin (LIPITOR) 80 MG tablet Take 80 mg by mouth at bedtime.   Yes [provider]  brimonidine (ALPHAGAN) 0.2 % ophthalmic solution Place 1 drop into both eyes 2 (two) times daily.   Yes [provider]  cloNIDine (CATAPRES) 0.1 MG tablet Take 0.1 mg by mouth every 6 (six) hours as needed (BP>160/90).   Yes [provider]  docusate sodium (COLACE) 100 MG capsule Take 100 mg by mouth at bedtime.   Yes [provider]  dorzolamide (TRUSOPT) 2 % ophthalmic  solution Place 1 drop into both eyes 2 (two) times daily.   Yes [provider]  ferrous sulfate 325 (65 FE) MG tablet Take 1 tablet (325 mg total) by mouth daily. 09/23/20  Yes [name], [name], DO  furosemide (LASIX) 80 MG tablet Take 1 tablet (80 mg total) by mouth 2 (two) times daily. 10/31/21  Yes Erskine Emery, MD  hydrALAZINE (APRESOLINE) 25 MG tablet Take 25 mg by mouth 3 (three) times daily.   Yes [provider]  insulin aspart (NOVOLOG FLEXPEN) 100 UNIT/ML FlexPen Inject 2-10 Units into the skin 3 (three) times daily with meals. Inject per sliding scale   Yes [provider]  Insulin Glargine (BASAGLAR KWIKPEN) 100 UNIT/ML Inject 15 Units into the skin daily.   Yes [provider]  latanoprost (XALATAN) 0.005 % ophthalmic solution Place 1 drop into both eyes at bedtime.   Yes [provider]  Magnesium Oxide 400 MG CAPS Take  1 capsule (400 mg total) by mouth daily. 11/01/21  Yes Erskine Emery, MD  Multiple Vitamin (MULTIVITAMIN) tablet Take 1 tablet by mouth daily.   Yes [provider]  Nutritional Supplements (FEEDING SUPPLEMENT, NEPRO CARB STEADY,) LIQD Take 237 mLs by mouth at bedtime. 10/31/21  Yes Erskine Emery, MD  ondansetron (ZOFRAN) 4 MG tablet Take 4 mg by mouth every 4 (four) hours as needed for nausea or vomiting.   Yes [provider]  pantoprazole (PROTONIX) 40 MG tablet Take 1 tablet (40 mg total) by mouth daily. 10/31/21  Yes Maxwell, Allee, MD  potassium chloride SA (KLOR-CON M) 20 MEQ tablet Take 1 tablet (20 mEq total) by mouth daily. 12/11/21  Yes [place] Eric J, DO  pregabalin (LYRICA) 75 MG capsule Take 1 capsule (75 mg total) by mouth daily. 10/31/21  Yes Maxwell, Allee, MD  PROTEIN PO Take 30 mLs by mouth 2 (two) times daily.   Yes [provider]  saccharomyces boulardii (FLORASTOR) 250 MG capsule Take 1 capsule (250 mg total) by mouth 2 (two) times daily. 09/07/20  Yes Antonieta Pert, MD  sertraline  (ZOLOFT) 50 MG tablet Take 50 mg by mouth every other day.   Yes [provider]  sodium bicarbonate 650 MG tablet Take 2 tablets (1,300 mg total) by mouth 2 (two) times daily. 10/31/21  Yes Erskine Emery, MD  tamsulosin (FLOMAX) 0.4 MG CAPS capsule Take 0.8 mg by mouth every evening.   Yes [provider]  Vitamin D, Ergocalciferol, (DRISDOL) 1.25 MG (50000 UNIT) CAPS capsule Take 50,000 Units by mouth every 30 (thirty) days. 17th of each month   Yes [provider]  zinc sulfate 220 (50 Zn) MG capsule Take 220 mg by mouth daily.   Yes [provider]     Vitals:   03/19/22 0431 03/19/22 1927 03/19/22 2034 03/20/22 0431  BP:  (!) 193/99 (!) 172/95 (!) 176/91  Pulse:  83 76 73  Resp:  '16 16 18  '$ Temp:  (!) 97.5 F (36.4 C) 97.7 F (36.5 C) 97.9 F (36.6 C)  TempSrc:  Oral Oral Oral  SpO2:  99% 100% 99%  Weight: 116.1 kg      Exam Gen alert, no distress No rash, cyanosis or gangrene Sclera anicteric, throat clear  No jvd or bruits Chest bilat basilar rales, no wheezing RRR no MRG Abd soft ntnd no mass or ascites +bs GU normal male MS no joint effusions or deformity Ext diffuse 2-3+ pitting hip/ abd wall/ stump edema Neuro is alert, Ox 3 , R hemiparesis chronic      Home meds include - albuterol, norvasc 10, aspirin, lipitor, catapres patch 0.1, colace, lasix 80 bid, hydralazine 25 tid, insulin aspart/ glargine, nepro, protonix, klorcon 20, lyrica, zoloft, sod bicarb     Date   Creat  eGFR    2021   1.10- 2.31    2022   1.25- 2.07 Mar 2021  2.90 >> 1.21    Aug- sept 2023 1.97- 3.79    Oct 2023  2.53- 2.61 27- 29 ml/min     1/21/ 24  3.22     03/18/22  3.17    UA pending   UCr 51, UNa pend    CXR - pulm edema, CM     Assessment/ Plan: AKI on CKD 4 - b/l creat 2.5- 2.6 in Oct 2023, eGFR 27-29 ml/min. Creat here 3.1 on admission in setting of decomp CHF w/ anasarca and pulm edema  getting diuresis w/ IV lasix. AKI due to decomp CHF  vs progression of CKD. Will ^IV lasix to '80mg'$  tid and titrate further as needed. No uremic sxs and no need for RRT at this time. Get renal US, urine lytes and UA. Will follow.  PAD/  bilat LE amputee - gets around in a wheelchair Vol overload/ decomp diast CHF - as above Anemia - due in part to CKD most likely. Tsat 12%, will give additional IV iron '250mg'$  x 2 while here AHRF - cont diuresing DM2 H/o CVA - w/ R hemiparesis    Roy Jaxan Michel  MD 03/20/2022, 6:48 AM Recent Labs  Lab 03/17/22 1440 03/18/22 0404 03/19/22 0754  HGB 8.1* 7.4* 7.5*  ALBUMIN 2.7* 2.5*  --   CALCIUM 7.8* 8.0* 8.0*  CREATININE 3.22* 3.17* 3.32*  K 4.3 4.1 4.1   Inpatient medications:  aspirin  81 mg Oral Daily   brimonidine  1 drop Both Eyes BID   carvedilol  3.125 mg Oral BID WC   dorzolamide  1 drop Both Eyes BID   furosemide  60 mg Intravenous Q12H   heparin  5,000 Units Subcutaneous Q8H   insulin aspart  0-5 Units Subcutaneous QHS   insulin aspart  0-9 Units Subcutaneous TID WC   insulin glargine-yfgn  10 Units Subcutaneous QHS   latanoprost  1 drop Both Eyes QHS   pantoprazole  40 mg Oral Daily   pregabalin  75 mg Oral Daily   sertraline  50 mg Oral QODAY   sodium bicarbonate  1,300 mg Oral BID   tamsulosin  0.8 mg Oral QPM    acetaminophen **OR** acetaminophen, metoprolol tartrate, nitroGLYCERIN, ondansetron **OR** ondansetron (ZOFRAN) IV

## 2022-03-20 NOTE — Progress Notes (Signed)
[facility] PROGRESS NOTE    Progress Note  [name]  TKW:[id number] DOB: Mar 15, 1960 DOA: 03/17/2022 PCP: Jodi Marble, MD     Brief Narrative:   [name] is an 62 y.o. male past medical history of chronic diastolic heart failure, chronic kidney disease stage IV, diabetes mellitus type 2, peripheral vascular disease with a BKA brought in for shortness of breath, CT of the chest showed groundglass opacities   Assessment/Plan:   Acute respiratory failure with hypoxia secondary to pulmonary edema/chronic diastolic heart failure: In the setting of that chronic diastolic heart failure. Nephrology was consulted will start on IV Lasix continue on Flomax. Negative about 2 L, recommended to continue IV Lasix. Check renal ultrasound. No signs of uremia.  Chronic kidney disease stage IV: Resume hydralazine and amlodipine. Also resume bicarbonate tablets.  Chest pain: Unlikely cardiac improved with GI cocktail.  Peripheral vascular disease/diabetes mellitus type 2: With an A1c of 6.1. Prior imitation continue Lyrica. Continue long-acting insulin per sliding scale, blood glucose fairly controlled.  Normocytic anemia: Continue to monitor.  Significant results are present on admission unstageable RN Pressure Injury Documentation: Pressure Injury 03/02/21 Coccyx (Active)  03/02/21 1740  Location: Coccyx  Location Orientation:   Staging:   Wound Description (Comments):   Present on Admission:      Pressure Injury 12/08/21 Other (Comment) Left;Posterior Unstageable - Full thickness tissue loss in which the base of the injury is covered by slough (yellow, tan, gray, green or brown) and/or eschar (tan, brown or black) in the wound bed. (Active)  12/08/21 2300  Location: Other (Comment)  Location Orientation: Left;Posterior  Staging: Unstageable - Full thickness tissue loss in which the base of the injury is covered by slough (yellow, tan, gray, green or brown)  and/or eschar (tan, brown or black) in the wound bed.  Wound Description (Comments):   Present on Admission: Yes      DVT prophylaxis: lovenox Family Communication:none Status is: Inpatient Remains inpatient appropriate because: Remains inpatient for heart failure    Code Status:     Code Status Orders  (From admission, onward)           Start     Ordered   03/17/22 2133  Full code  Continuous       Question:  By:  Answer:  Consent: discussion documented in EHR   03/17/22 2134           Code Status History     Date Active Date Inactive Code Status Order ID Comments User Context   03/17/2022 2101 03/17/2022 2134 Full Code [id number]  Kristopher Oppenheim, DO ED   12/08/2021 2207 12/11/2021 1757 Full Code [id number]  Lenore Cordia, MD ED   10/19/2021 1602 11/01/2021 2202 Full Code [id number]  Eppie Gibson, MD ED   03/01/2021 1507 03/13/2021 0408 Full Code [id number]  Karmen Bongo, MD Inpatient   11/18/2020 2041 12/12/2020 0309 Full Code [id number]  Orene Desanctis, DO ED   08/22/2020 0531 09/22/2020 1954 Full Code [id number]  Etta Quill, DO ED   06/30/2020 1243 07/05/2020 2345 Full Code [id number]  Geradine Girt, DO ED   06/25/2020 [place] 06/28/2020 2024 Full Code [id number]  Norval Morton, MD ED   01/17/2020 1708 01/31/2020 1753 DNR [id number]  Rosezella Rumpf, NP Inpatient   12/31/2019 2311 01/17/2020 1708 Full Code [id number]  [name], MD ED   12/31/2017 1739 01/02/2018 1315 Full Code [id number]  [name], [name], MD Inpatient  09/28/2017 1434 10/01/2017 1948 Full Code [id number]  MoltRomelle Starcher, DO ED   07/30/2017 2111 08/04/2017 1746 Full Code [id number]  [name], NP Inpatient   07/29/2017 2347 07/30/2017 2033 Full Code [id number]  Recardo Evangelist, PA-C ED   01/08/2015 2013 01/09/2015 2017 Full Code [id number]  [name], MD Inpatient   01/04/2015 1915 01/08/2015 2013 Full Code [id number]  [name], MD Inpatient   12/01/2014 1735 12/02/2014 1242 Full Code  [id number]  [name], MD ED   08/28/2013 1748 08/31/2013 1627 Full Code [id number]  [name], NP Inpatient   08/28/2013 1301 08/28/2013 1748 Full Code [id number]  [name], MD ED   05/10/2011 2141 05/12/2011 1747 Full Code [id number]  [name], PA-C ED         IV Access:   Peripheral IV   Procedures and diagnostic studies:   ECHOCARDIOGRAM COMPLETE  Result Date: 03/18/2022    ECHOCARDIOGRAM REPORT   Patient Name:   [name] Date of Exam: 03/18/2022 Medical Rec #:  [id number]     Height:       68.0 in Accession #:    [id number]    Weight:       235.4 lb Date of Birth:  December 30, 1960      BSA:          2.191 m Patient Age:    23 years      BP:           182/101 mmHg Patient Gender: M             HR:           77 bpm. Exam Location:  Inpatient Procedure: 2D Echo, Cardiac Doppler and Color Doppler Indications:    U38.45 Acute diastolic (congestive) heart failure  History:        Patient has prior history of Echocardiogram examinations. CHF;                 Risk Factors:Diabetes and Hypertension.  Sonographer:    Phineas Douglas Referring Phys: [phone] [place]  1. Left ventricular ejection fraction, by estimation, is 50 to 55%. The left ventricle has low normal function. The left ventricle has no regional wall motion abnormalities. There is moderate concentric left ventricular hypertrophy. Left ventricular diastolic parameters are consistent with Grade II diastolic dysfunction (pseudonormalization). Elevated left ventricular end-diastolic pressure.  2. Right ventricular systolic function is normal. The right ventricular size is normal. There is normal pulmonary artery systolic pressure.  3. The mitral valve is normal in structure. Trivial mitral valve regurgitation. No evidence of mitral stenosis.  4. The aortic valve is calcified. There is mild calcification of the aortic valve. There is mild thickening of the aortic valve. Aortic valve regurgitation is mild. Mild aortic valve  stenosis. Aortic valve area, by VTI measures 1.06 cm. Aortic valve mean gradient measures 11.3 mmHg. Aortic valve Vmax measures 2.23 m/s.  5. The inferior vena cava is normal in size with greater than 50% respiratory variability, suggesting right atrial pressure of 3 mmHg. FINDINGS  Left Ventricle: Left ventricular ejection fraction, by estimation, is 50 to 55%. The left ventricle has low normal function. The left ventricle has no regional wall motion abnormalities. The left ventricular internal cavity size was normal in size. There is moderate concentric left ventricular hypertrophy. Left ventricular diastolic parameters are consistent with Grade II diastolic dysfunction (pseudonormalization). Elevated left ventricular end-diastolic pressure. Right Ventricle: The right ventricular size is normal. No  increase in right ventricular wall thickness. Right ventricular systolic function is normal. There is normal pulmonary artery systolic pressure. The tricuspid regurgitant velocity is 2.71 m/s, and  with an assumed right atrial pressure of 3 mmHg, the estimated right ventricular systolic pressure is 02.6 mmHg. Left Atrium: Left atrial size was normal in size. Right Atrium: Right atrial size was normal in size. Pericardium: There is no evidence of pericardial effusion. Mitral Valve: The mitral valve is normal in structure. Trivial mitral valve regurgitation. No evidence of mitral valve stenosis. Tricuspid Valve: The tricuspid valve is normal in structure. Tricuspid valve regurgitation is trivial. No evidence of tricuspid stenosis. Aortic Valve: The aortic valve is calcified. There is mild calcification of the aortic valve. There is mild thickening of the aortic valve. Aortic valve regurgitation is mild. Mild aortic stenosis is present. Aortic valve mean gradient measures 11.3 mmHg. Aortic valve peak gradient measures 20.0 mmHg. Aortic valve area, by VTI measures 1.06 cm. Pulmonic Valve: The pulmonic valve was normal in  structure. Pulmonic valve regurgitation is not visualized. No evidence of pulmonic stenosis. Aorta: The aortic root is normal in size and structure. Venous: The inferior vena cava is normal in size with greater than 50% respiratory variability, suggesting right atrial pressure of 3 mmHg. IAS/Shunts: No atrial level shunt detected by color flow Doppler.  LEFT VENTRICLE PLAX 2D LVIDd:         4.10 cm      Diastology LVIDs:         3.00 cm      LV e' medial:    4.30 cm/s LV PW:         1.20 cm      LV E/e' medial:  24.9 LV IVS:        1.40 cm      LV e' lateral:   6.81 cm/s LVOT diam:     2.00 cm      LV E/e' lateral: 15.7 LV SV:         51 LV SV Index:   23 LVOT Area:     3.14 cm  LV Volumes (MOD) LV vol d, MOD A2C: 121.0 ml LV vol d, MOD A4C: 132.0 ml LV vol s, MOD A2C: 47.0 ml LV vol s, MOD A4C: 60.3 ml LV SV MOD A2C:     74.0 ml LV SV MOD A4C:     132.0 ml LV SV MOD BP:      74.0 ml RIGHT VENTRICLE RV S prime:     10.10 cm/s TAPSE (M-mode): 2.0 cm LEFT ATRIUM             Index LA diam:        4.70 cm 2.15 cm/m LA Vol (A2C):   44.4 ml 20.27 ml/m LA Vol (A4C):   68.7 ml 31.36 ml/m LA Biplane Vol: 58.8 ml 26.84 ml/m  AORTIC VALVE AV Area (Vmax):    1.10 cm AV Area (Vmean):   1.12 cm AV Area (VTI):     1.06 cm AV Vmax:           223.33 cm/s AV Vmean:          155.000 cm/s AV VTI:            0.481 m AV Peak Grad:      20.0 mmHg AV Mean Grad:      11.3 mmHg LVOT Vmax:         78.30 cm/s LVOT Vmean:  55.400 cm/s LVOT VTI:          0.163 m LVOT/AV VTI ratio: 0.34  AORTA Ao Root diam: 3.20 cm Ao Asc diam:  3.30 cm MITRAL VALVE                TRICUSPID VALVE MV Area (PHT): 3.27 cm     TR Peak grad:   29.4 mmHg MV Decel Time: 232 msec     TR Vmax:        271.00 cm/s MV E velocity: 107.00 cm/s MV A velocity: 102.00 cm/s  SHUNTS MV E/A ratio:  1.05         Systemic VTI:  0.16 m                             Systemic Diam: 2.00 cm Skeet Latch MD Electronically signed by Skeet Latch MD Signature Date/Time:  03/18/2022/11:08:06 PM    Final      Medical Consultants:   None.   Subjective:    [name] relates his breathing is better than yesterday.  Objective:    Vitals:   03/19/22 0431 03/19/22 1927 03/19/22 2034 03/20/22 0431  BP:  (!) 193/99 (!) 172/95 (!) 176/91  Pulse:  83 76 73  Resp:  '16 16 18  '$ Temp:  (!) 97.5 F (36.4 C) 97.7 F (36.5 C) 97.9 F (36.6 C)  TempSrc:  Oral Oral Oral  SpO2:  99% 100% 99%  Weight: 116.1 kg      SpO2: 99 % O2 Flow Rate (L/min): 2 L/min   Intake/Output Summary (Last 24 hours) at 03/20/2022 1127 Last data filed at 03/20/2022 4010 Gross per 24 hour  Intake 645.36 ml  Output 1700 ml  Net -1054.64 ml   Filed Weights   03/19/22 0431  Weight: 116.1 kg    Exam: General exam: In no acute distress. Respiratory system: Good air movement and clear to auscultation. Cardiovascular system: S1 & S2 heard, RRR.  Positive JVD Gastrointestinal system: Abdomen is nondistended, soft and nontender.  Extremities: No pedal edema. Skin: No rashes, lesions or ulcers Psychiatry: Judgement and insight appear normal. Mood & affect appropriate.    Data Reviewed:    Labs: Basic Metabolic Panel: Recent Labs  Lab 03/17/22 1440 03/18/22 0404 03/19/22 0754  NA 134* 139 139  K 4.3 4.1 4.1  CL 112* 113* 111  CO2 17* 20* 21*  GLUCOSE 171* 104* 113*  BUN 57* 53* 60*  CREATININE 3.22* 3.17* 3.32*  CALCIUM 7.8* 8.0* 8.0*   GFR Estimated Creatinine Clearance: 29.3 mL/min (A) (by C-G formula based on SCr of 3.32 mg/dL (H)). Liver Function Tests: Recent Labs  Lab 03/17/22 1440 03/18/22 0404  AST 16 10*  ALT 18 15  ALKPHOS 115 91  BILITOT 0.5 0.4  PROT 7.0 6.4*  ALBUMIN 2.7* 2.5*   No results for input(s): "LIPASE", "AMYLASE" in the last 168 hours. No results for input(s): "AMMONIA" in the last 168 hours. Coagulation profile No results for input(s): "INR", "PROTIME" in the last 168 hours. COVID-19 Labs  Recent Labs    03/19/22 0754   FERRITIN 235    Lab Results  Component Value Date   SARSCOV2NAA NEGATIVE 03/17/2022   SARSCOV2NAA NEGATIVE 12/08/2021   SARSCOV2NAA NEGATIVE 10/19/2021   [facility] NEGATIVE 03/12/2021    CBC: Recent Labs  Lab 03/17/22 1440 03/18/22 0404 03/19/22 0754  WBC 6.3 5.2 4.1  NEUTROABS 4.9 3.3  --  HGB 8.1* 7.4* 7.5*  HCT 26.6* 24.8* 24.9*  MCV 83.9 84.1 83.8  PLT 174 162 162   Cardiac Enzymes: No results for input(s): "CKTOTAL", "CKMB", "CKMBINDEX", "TROPONINI" in the last 168 hours. BNP (last 3 results) No results for input(s): "PROBNP" in the last 8760 hours. CBG: Recent Labs  Lab 03/19/22 0749 03/19/22 1139 03/19/22 1653 03/19/22 2031 03/20/22 0718  GLUCAP 124* 122* 148* 176* 110*   D-Dimer: No results for input(s): "DDIMER" in the last 72 hours. Hgb A1c: Recent Labs    03/18/22 1700  HGBA1C 6.1*   Lipid Profile: No results for input(s): "CHOL", "HDL", "LDLCALC", "TRIG", "CHOLHDL", "LDLDIRECT" in the last 72 hours. Thyroid function studies: No results for input(s): "TSH", "T4TOTAL", "T3FREE", "THYROIDAB" in the last 72 hours.  Invalid input(s): "FREET3" Anemia work up: Recent Labs    03/19/22 0754  VITAMINB12 321  FOLATE 9.3  FERRITIN 235  TIBC 220*  IRON 26*  RETICCTPCT 1.5   Sepsis Labs: Recent Labs  Lab 03/17/22 1440 03/18/22 0404 03/18/22 1700 03/19/22 0754  PROCALCITON  --  <0.10  --   --   WBC 6.3 5.2  --  4.1  LATICACIDVEN  --   --  0.7  --    Microbiology Recent Results (from the past 240 hour(s))  Resp panel by RT-PCR (RSV, Flu A&B, Covid) Anterior Nasal Swab     Status: None   Collection Time: 03/17/22  3:00 PM   Specimen: Anterior Nasal Swab  Result Value Ref Range Status   SARS Coronavirus 2 by RT PCR NEGATIVE NEGATIVE Final    Comment: (NOTE) SARS-CoV-2 target nucleic acids are NOT DETECTED.  The SARS-CoV-2 RNA is generally detectable in upper respiratory specimens during the acute phase of infection. The  lowest concentration of SARS-CoV-2 viral copies this assay can detect is 138 copies/mL. A negative result does not preclude SARS-Cov-2 infection and should not be used as the sole basis for treatment or other patient management decisions. A negative result may occur with  improper specimen collection/handling, submission of specimen other than nasopharyngeal swab, presence of viral mutation(s) within the areas targeted by this assay, and inadequate number of viral copies(<138 copies/mL). A negative result must be combined with clinical observations, patient history, and epidemiological information. The expected result is Negative.  Fact Sheet for Patients:  EntrepreneurPulse.com.au  Fact Sheet for Healthcare Providers:  IncredibleEmployment.be  This test is no t yet approved or cleared by the [place] FDA and  has been authorized for detection and/or diagnosis of SARS-CoV-2 by FDA under an Emergency Use Authorization (EUA). This EUA will remain  in effect (meaning this test can be used) for the duration of the COVID-19 declaration under Section 564(b)(1) of the Act, 21 U.S.C.section 360bbb-3(b)(1), unless the authorization is terminated  or revoked sooner.       Influenza A by PCR NEGATIVE NEGATIVE Final   Influenza B by PCR NEGATIVE NEGATIVE Final    Comment: (NOTE) The Xpert Xpress SARS-CoV-2/FLU/RSV plus assay is intended as an aid in the diagnosis of influenza from Nasopharyngeal swab specimens and should not be used as a sole basis for treatment. Nasal washings and aspirates are unacceptable for Xpert Xpress SARS-CoV-2/FLU/RSV testing.  Fact Sheet for Patients: EntrepreneurPulse.com.au  Fact Sheet for Healthcare Providers: IncredibleEmployment.be  This test is not yet approved or cleared by the [place] FDA and has been authorized for detection and/or diagnosis of SARS-CoV-2 by FDA under  an Emergency Use Authorization (EUA). This EUA will remain in  effect (meaning this test can be used) for the duration of the COVID-19 declaration under Section 564(b)(1) of the Act, 21 U.S.C. section 360bbb-3(b)(1), unless the authorization is terminated or revoked.     Resp Syncytial Virus by PCR NEGATIVE NEGATIVE Final    Comment: (NOTE) Fact Sheet for Patients: EntrepreneurPulse.com.au  Fact Sheet for Healthcare Providers: IncredibleEmployment.be  This test is not yet approved or cleared by the [place] FDA and has been authorized for detection and/or diagnosis of SARS-CoV-2 by FDA under an Emergency Use Authorization (EUA). This EUA will remain in effect (meaning this test can be used) for the duration of the COVID-19 declaration under Section 564(b)(1) of the Act, 21 U.S.C. section 360bbb-3(b)(1), unless the authorization is terminated or revoked.  Performed at [facility], [place] [address], [place], [place] [address]   Blood culture (routine x 2)     Status: None (Preliminary result)   Collection Time: 03/17/22 10:14 PM   Specimen: BLOOD  Result Value Ref Range Status   Specimen Description   Final    BLOOD BLOOD LEFT HAND Performed at [facility] [address], [place], [place] [address]    Special Requests   Final    BOTTLES DRAWN AEROBIC AND ANAEROBIC Blood Culture results may not be optimal due to an excessive volume of blood received in culture bottles Performed at [facility] [address], [place], [place] [address]    Culture   Final    NO GROWTH 2 DAYS Performed at [facility] [address], [place], [place] [address]    Report Status PENDING  Incomplete  Blood culture (routine x 2)     Status: None (Preliminary result)   Collection Time: 03/17/22 10:18 PM   Specimen: BLOOD  Result Value Ref Range Status   Specimen Description   Final    BLOOD  BLOOD LEFT FOREARM Performed at [facility] [address] [place], [place] [address]    Special Requests   Final    BOTTLES DRAWN AEROBIC AND ANAEROBIC Blood Culture results may not be optimal due to an excessive volume of blood received in culture bottles Performed at [facility] [address] [place], [place] [address]    Culture   Final    NO GROWTH 2 DAYS Performed at [facility] [address], [place], [place] [address]    Report Status PENDING  Incomplete     Medications:    aspirin  81 mg Oral Daily   brimonidine  1 drop Both Eyes BID   carvedilol  3.125 mg Oral BID WC   dorzolamide  1 drop Both Eyes BID   furosemide  80 mg Intravenous Q8H   heparin  5,000 Units Subcutaneous Q8H   insulin aspart  0-5 Units Subcutaneous QHS   insulin aspart  0-9 Units Subcutaneous TID WC   insulin glargine-yfgn  10 Units Subcutaneous QHS   latanoprost  1 drop Both Eyes QHS   pantoprazole  40 mg Oral Daily   pregabalin  75 mg Oral Daily   sertraline  50 mg Oral QODAY   sodium bicarbonate  1,300 mg Oral BID   tamsulosin  0.8 mg Oral QPM   Continuous Infusions:  [START ON 03/21/2022] ferric gluconate (FERRLECIT) IVPB        LOS: 2 days   Charlynne Cousins  [facility]  03/20/2022, 11:27 AM

## 2022-03-21 DIAGNOSIS — R0602 Shortness of breath: Secondary | ICD-10-CM | POA: Diagnosis not present

## 2022-03-21 DIAGNOSIS — N184 Chronic kidney disease, stage 4 (severe): Secondary | ICD-10-CM | POA: Diagnosis not present

## 2022-03-21 DIAGNOSIS — J189 Pneumonia, unspecified organism: Secondary | ICD-10-CM | POA: Diagnosis not present

## 2022-03-21 DIAGNOSIS — I5033 Acute on chronic diastolic (congestive) heart failure: Secondary | ICD-10-CM | POA: Diagnosis not present

## 2022-03-21 LAB — CBC
HCT: 24.7 % — ABNORMAL LOW (ref 39.0–52.0)
Hemoglobin: 7.6 g/dL — ABNORMAL LOW (ref 13.0–17.0)
MCH: 25.5 pg — ABNORMAL LOW (ref 26.0–34.0)
MCHC: 30.8 g/dL (ref 30.0–36.0)
MCV: 82.9 fL (ref 80.0–100.0)
Platelets: 175 10*3/uL (ref 150–400)
RBC: 2.98 MIL/uL — ABNORMAL LOW (ref 4.22–5.81)
RDW: 16.4 % — ABNORMAL HIGH (ref 11.5–15.5)
WBC: 3.3 10*3/uL — ABNORMAL LOW (ref 4.0–10.5)
nRBC: 0 % (ref 0.0–0.2)

## 2022-03-21 LAB — GLUCOSE, CAPILLARY
Glucose-Capillary: 197 mg/dL — ABNORMAL HIGH (ref 70–99)
Glucose-Capillary: 141 mg/dL — ABNORMAL HIGH (ref 70–99)
Glucose-Capillary: 131 mg/dL — ABNORMAL HIGH (ref 70–99)
Glucose-Capillary: 134 mg/dL — ABNORMAL HIGH (ref 70–99)

## 2022-03-21 LAB — BASIC METABOLIC PANEL
Anion gap: 8 (ref 5–15)
BUN: 58 mg/dL — ABNORMAL HIGH (ref 6–20)
CO2: 21 mmol/L — ABNORMAL LOW (ref 22–32)
Calcium: 8.1 mg/dL — ABNORMAL LOW (ref 8.9–10.3)
Chloride: 110 mmol/L (ref 98–111)
Creatinine, Ser: 2.87 mg/dL — ABNORMAL HIGH (ref 0.61–1.24)
GFR, Estimated: 24 mL/min — ABNORMAL LOW (ref >60)
Glucose, Bld: 136 mg/dL — ABNORMAL HIGH (ref 70–99)
Potassium: 3.7 mmol/L (ref 3.5–5.1)
Sodium: 139 mmol/L (ref 135–145)

## 2022-03-21 IMAGING — CT CT ABD-PELV W/O CM
2 of 4 series · 16 of 46 positions shown, 18 images · non-contrast
Comparison: 03/08/2020

CLINICAL DATA: Vomiting and diarrhea.

EXAM:
CT ABDOMEN AND PELVIS WITHOUT CONTRAST
TECHNIQUE: Multidetector CT imaging of the abdomen and pelvis was performed
following the standard protocol without IV contrast.

[Series 2: axial st · axial · 0.84mm/px · z∈[+1104,+1549]mm · 13 of 99 slices shown, 15 images]
[im 5/99  soft-tissue]
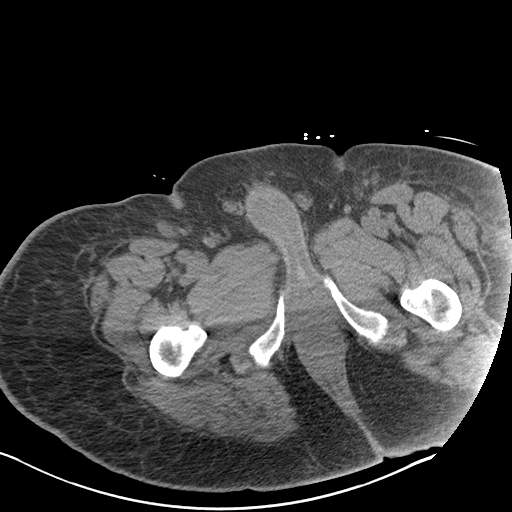
[im 5/99  bone]
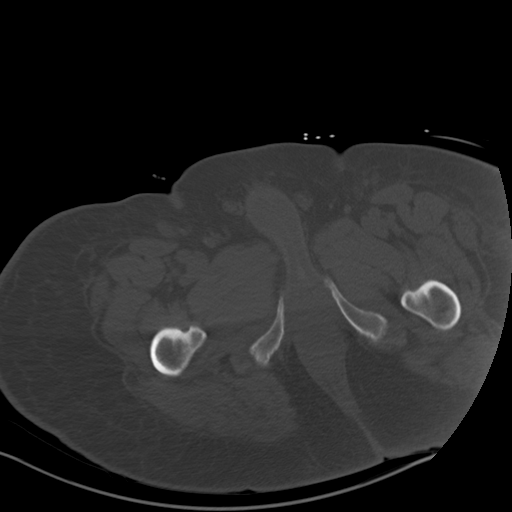
[im 15/99  soft-tissue]
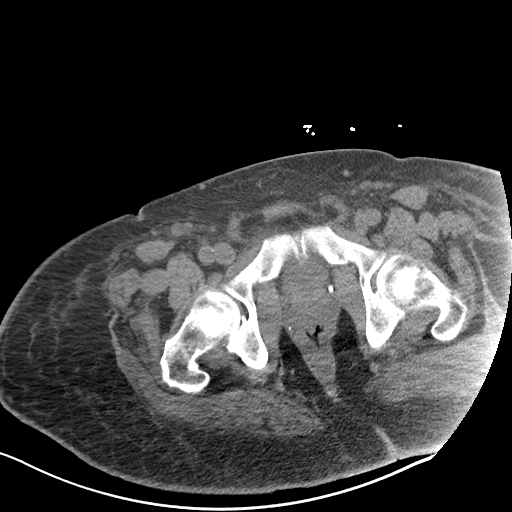
[im 20/99  soft-tissue]
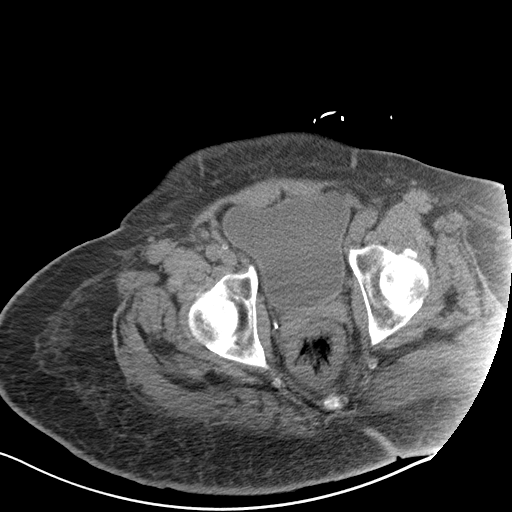
[im 30/99  soft-tissue]
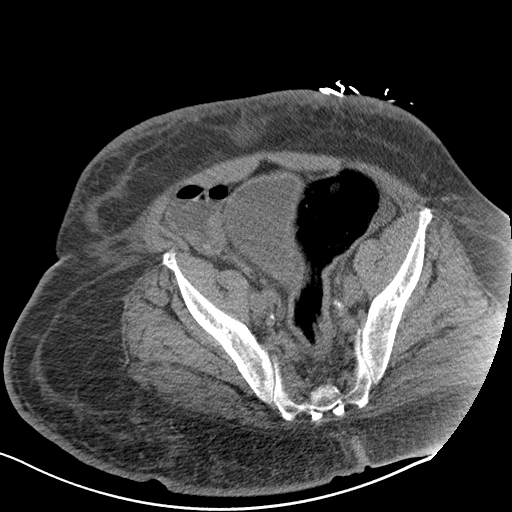
[im 35/99  soft-tissue]
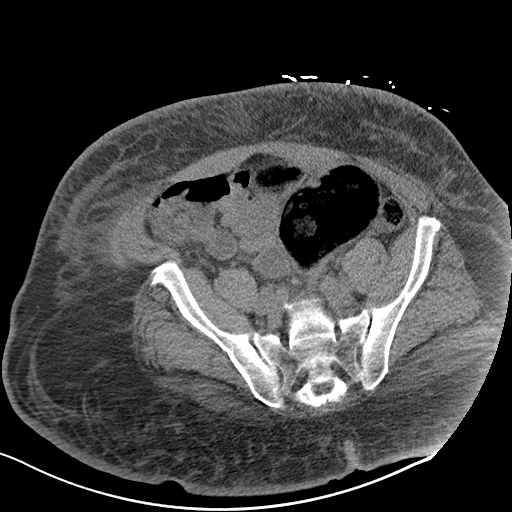
[im 45/99  soft-tissue]
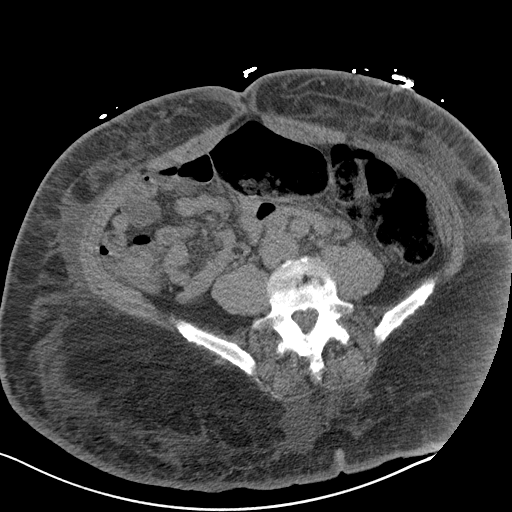
[im 50/99  soft-tissue]
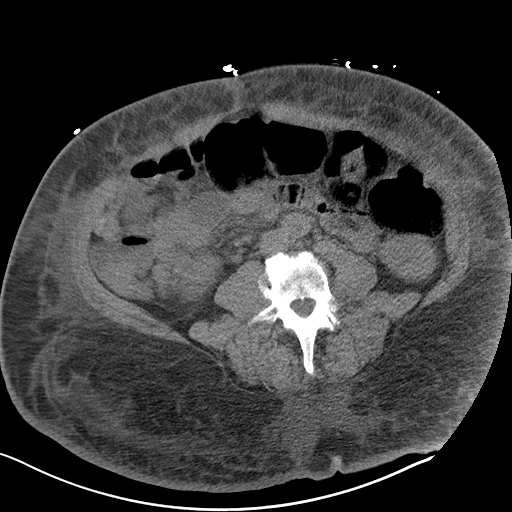
[im 54/99  soft-tissue]
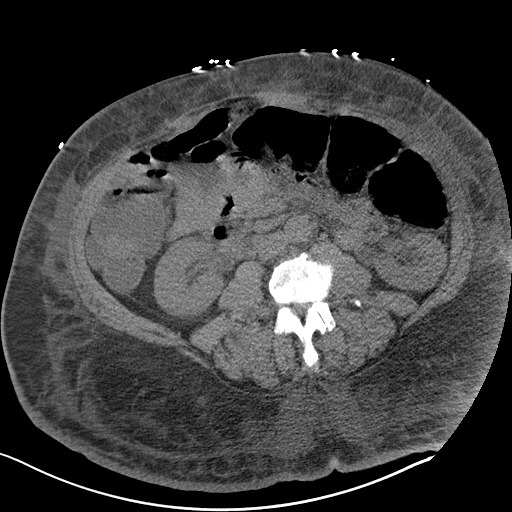
[im 64/99  soft-tissue]
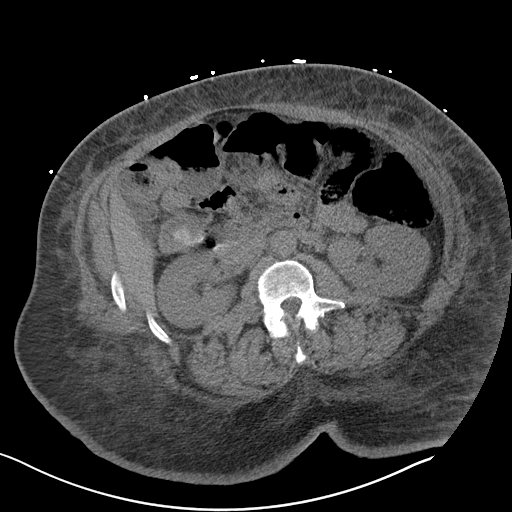
[im 64/99  bone]
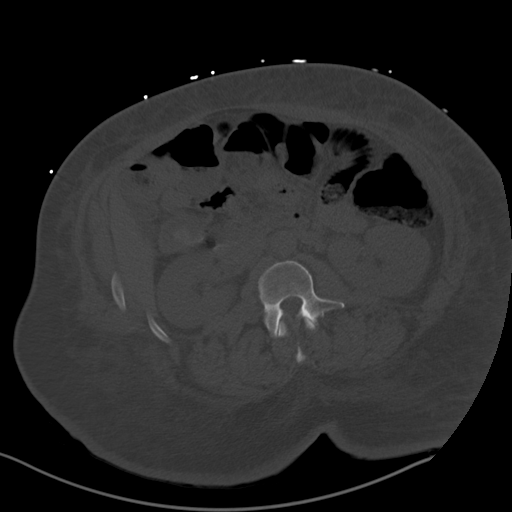
[im 69/99  soft-tissue]
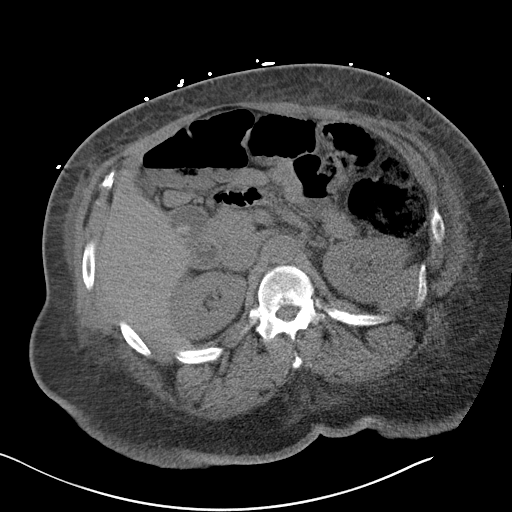
[im 79/99  soft-tissue]
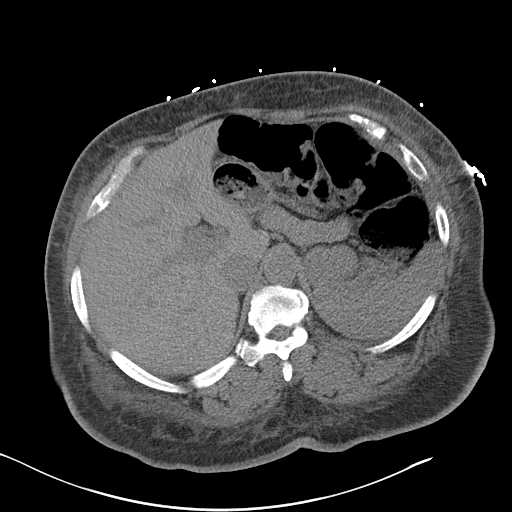
[im 84/99  soft-tissue]
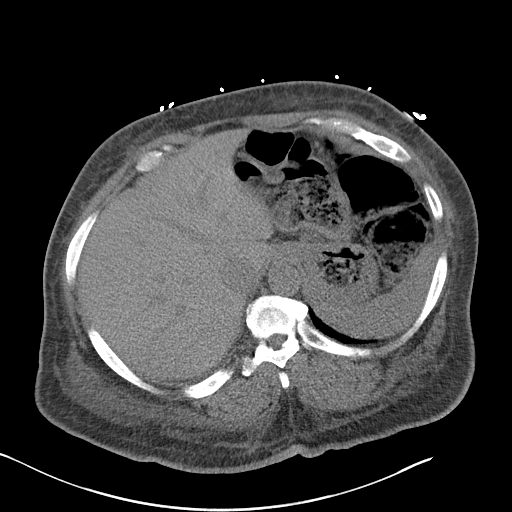
[im 94/99  soft-tissue]
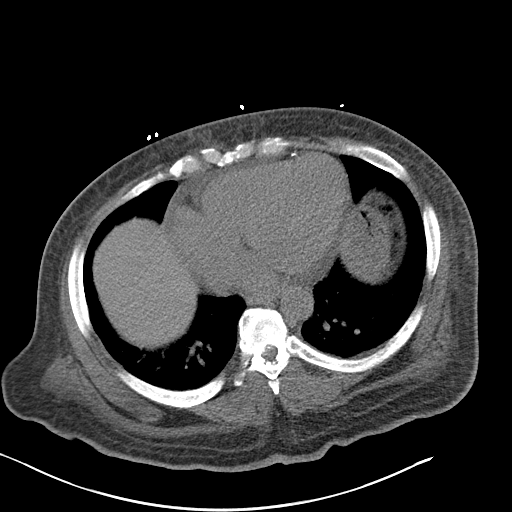

[Series 5: coronal st · coronal · 0.87mm/px · 3 of 177 slices shown]
[im 59/177  soft-tissue]
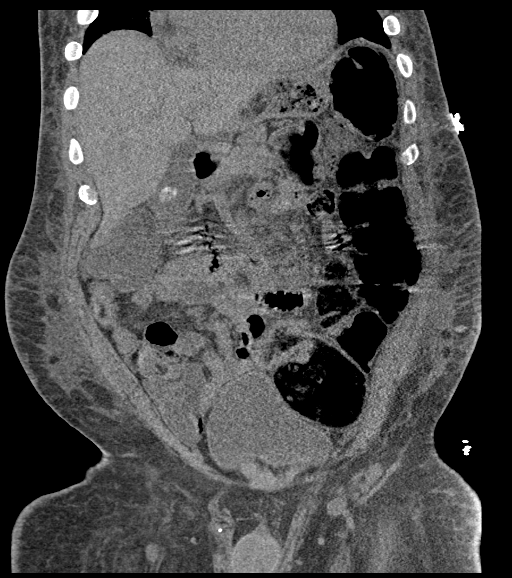
[im 79/177  soft-tissue]
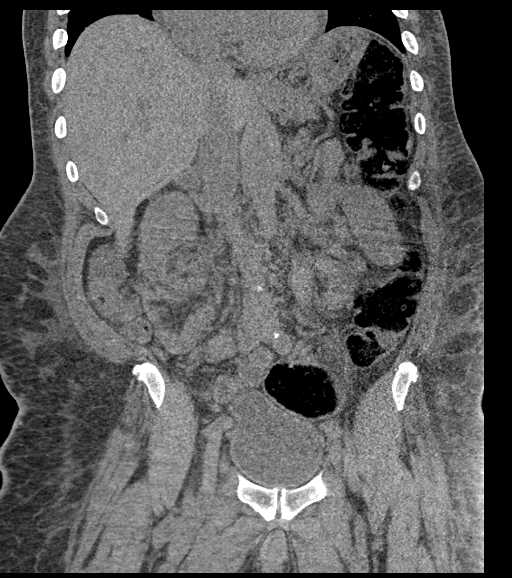
[im 98/177  soft-tissue]
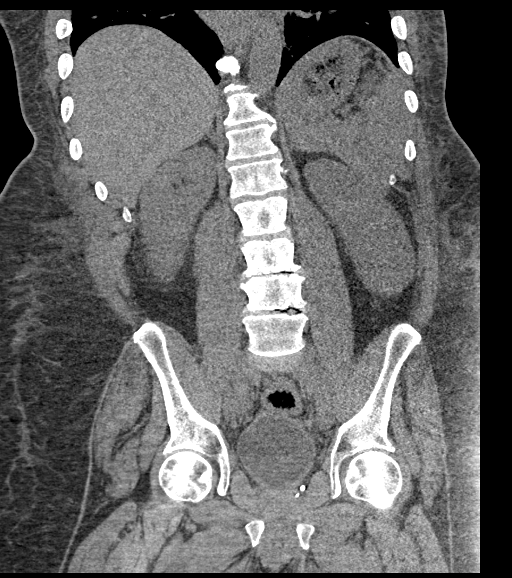

[16 of 46 positions shown; findings below may reference images not displayed]

FINDINGS: Lower chest: Moderate pericardial effusion, mildly increased from
prior. Circumflex coronary artery atherosclerotic calcification.
Mild cardiomegaly. Gynecomastia on the right. Subcutaneous edema
along the lower chest. Mild airway thickening in the lower lobes.

Hepatobiliary: 2.6 cm gallstone in the gallbladder. No biliary
dilatation.

Pancreas: Unremarkable

Spleen: Unremarkable

Adrenals/Urinary Tract: Unremarkable

Stomach/Bowel: Frothy fluid in the distal colon corresponding to the
patient's reported diarrheal process. Borderline rectal wall
thickening. No dilated small bowel. Low-grade mesenteric edema
although not disproportionate to the diffuse subcutaneous edema.

Vascular/Lymphatic: Mild aortoiliac atherosclerotic vascular
calcification. Multiple upper normal likely reactive inguinal lymph
nodes.

Reproductive: Unremarkable

Other: Diffuse subcutaneous and mesenteric edema.

Musculoskeletal: Lumbar spondylosis and degenerative disc disease
with impingement at L2-3, L3-4, L4-5, and L5-S1.
IMPRESSION: 1. Frothy fluid in the distal colon compatible with patient's
reported diarrheal process. Borderline rectal wall thickening could
reflect a low-grade distal colitis.
2. Diffuse subcutaneous and mesenteric edema suggesting third
spacing of fluid, correlate with any
hypoproteinemia/hypoalbuminemia.
3. Moderate pericardial effusion, mildly increased from prior.
4. Other imaging findings of potential clinical significance: Aortic
Atherosclerosis ([id number]). Coronary atherosclerosis. Mild
cardiomegaly. Airway thickening is present, suggesting bronchitis or
reactive airways disease. Cholelithiasis. Multilevel lumbar
impingement due to spondylosis and degenerative disc disease.

## 2022-03-21 MED ORDER — CARVEDILOL 6.25 MG PO TABS
6.2500 mg | ORAL_TABLET | Freq: Two times a day (BID) | ORAL | Status: DC
  Administered 2022-03-21 – 2022-03-23 (×5): 6.25 mg via ORAL
  Filled 2022-03-21 (×5): qty 1

## 2022-03-21 MED ORDER — INSULIN ASPART 100 UNIT/ML IJ SOLN
2.0000 [IU] | Freq: Three times a day (TID) | INTRAMUSCULAR | Status: DC
  Administered 2022-03-21 – 2022-04-02 (×35): 2 [IU] via SUBCUTANEOUS

## 2022-03-21 NOTE — Progress Notes (Signed)
[facility] PROGRESS NOTE    Progress Note  [name]  PPJ:[id number] DOB: [birth year] DOA: 03/17/2022 PCP: Jodi Marble, MD     Brief Narrative:   [name] is an 62 y.o. male past medical history of chronic diastolic heart failure, chronic kidney disease stage IV, diabetes mellitus type 2, peripheral vascular disease with a BKA brought in for shortness of breath, CT of the chest showed groundglass opacities  Assessment/Plan:   Acute respiratory failure with hypoxia secondary to pulmonary edema/chronic diastolic heart failure: In the setting of that chronic diastolic heart failure. Nephrology was consulted will start on IV Lasix continue on Flomax. Negative about 4-1/2 L, renal ultrasound chronic renal disease bilaterally no hydronephrosis. Blood pressure is improving with diuresis. Creatinine continues to improve with diuresis.  Chronic kidney disease stage IV: Continue hydralazine and amlodipine. Also resume bicarbonate tablets.  Essential hypertension: Currently on Norvasc, Coreg IV Lasix. Blood pressure is elevated but is improving with diuresis.  Chest pain: Unlikely cardiac improved with GI cocktail.  Peripheral vascular disease/diabetes mellitus type 2: With an A1c of 6.1. Continue Lyrica. Continue long-acting insulin per sliding scale, blood glucose fairly controlled.  Normocytic anemia: Continue to monitor.  Leukopenia: Borderline we will need to follow-up as an outpatient with PCP.  Significant results are present on admission unstageable RN Pressure Injury Documentation: Pressure Injury 03/02/21 Coccyx (Active)  03/02/21 1740  Location: Coccyx  Location Orientation:   Staging:   Wound Description (Comments):   Present on Admission:      Pressure Injury 12/08/21 Other (Comment) Left;Posterior Unstageable - Full thickness tissue loss in which the base of the injury is covered by slough (yellow, tan, gray, green or brown) and/or eschar  (tan, brown or black) in the wound bed. (Active)  12/08/21 2300  Location: Other (Comment)  Location Orientation: Left;Posterior  Staging: Unstageable - Full thickness tissue loss in which the base of the injury is covered by slough (yellow, tan, gray, green or brown) and/or eschar (tan, brown or black) in the wound bed.  Wound Description (Comments):   Present on Admission: Yes      DVT prophylaxis: lovenox Family Communication:none Status is: Inpatient Remains inpatient appropriate because: Remains inpatient for heart failure    Code Status:     Code Status Orders  (From admission, onward)           Start     Ordered   03/17/22 2133  Full code  Continuous       Question:  By:  Answer:  Consent: discussion documented in EHR   03/17/22 2134           Code Status History     Date Active Date Inactive Code Status Order ID Comments User Context   03/17/2022 2101 03/17/2022 2134 Full Code [id number]  Kristopher Oppenheim, DO ED   12/08/2021 2207 12/11/2021 1757 Full Code [id number]  Lenore Cordia, MD ED   10/19/2021 1602 11/01/2021 2202 Full Code [id number]  Eppie Gibson, MD ED   03/01/2021 1507 03/13/2021 0408 Full Code [id number]  Karmen Bongo, MD Inpatient   11/18/2020 2041 12/12/2020 0309 Full Code [id number]  Orene Desanctis, DO ED   08/22/2020 0531 09/22/2020 1954 Full Code [id number]  Etta Quill, DO ED   06/30/2020 1243 07/05/2020 2345 Full Code [id number]  Geradine Girt, DO ED   06/25/2020 [place] 06/28/2020 2024 Full Code [id number]  Norval Morton, MD ED   01/17/2020 1708 01/31/2020 1753 DNR [id number]  Rosezella Rumpf, NP Inpatient   12/31/2019 2311 01/17/2020 1708 Full Code [id number]  [name], MD ED   12/31/2017 1739 01/02/2018 1315 Full Code [id number]  [name], [name], MD Inpatient   09/28/2017 1434 10/01/2017 1948 Full Code [id number]  [name], [place], DO ED   07/30/2017 2111 08/04/2017 1746 Full Code [id number]  [name], NP Inpatient   07/29/2017 2347 07/30/2017 2033  Full Code [id number]  Recardo Evangelist, PA-C ED   01/08/2015 2013 01/09/2015 2017 Full Code [id number]  [name], MD Inpatient   01/04/2015 1915 01/08/2015 2013 Full Code [id number]  [name], MD Inpatient   12/01/2014 1735 12/02/2014 1242 Full Code [id number]  [name], MD ED   08/28/2013 1748 08/31/2013 1627 Full Code [id number]  [name], NP Inpatient   08/28/2013 1301 08/28/2013 1748 Full Code [id number]  [name], MD ED   05/10/2011 2141 05/12/2011 1747 Full Code [id number]  [name], PA-C ED         IV Access:   Peripheral IV   Procedures and diagnostic studies:   US RENAL  Result Date: 03/20/2022 CLINICAL DATA:  Acute renal failure superimposed on chronic kidney disease. EXAM: RENAL / URINARY TRACT ULTRASOUND COMPLETE COMPARISON:  Renal ultrasound 10/23/2021 FINDINGS: Right Kidney: Renal measurements: 11.5 x 5.9 x 6.1 cm = volume: 216 mL. Echogenicity is mildly increased. No mass or hydronephrosis visualized. Left Kidney: Renal measurements: 11.3 x 6.6 x 6.6 cm = volume: 260 mL. Echogenicity is mildly increased. No mass or hydronephrosis visualized. Bladder: Appears normal for degree of bladder distention. Bilateral ureteral jets are visualized. Other: None. IMPRESSION: Mildly increased echogenicity of the bilateral kidneys, as can be seen in medical renal disease. No hydronephrosis. Electronically Signed   By: Ronney Asters M.D.   On: 03/20/2022 15:32     Medical Consultants:   None.   Subjective:    [name] continues to improve feels better.  Objective:    Vitals:   03/20/22 1847 03/20/22 2013 03/21/22 0426 03/21/22 0429  BP: (!) 185/100 (!) 172/92 (!) 158/85   Pulse:  73 72   Resp:  19    Temp:  (!) 97.5 F (36.4 C) 97.8 F (36.6 C)   TempSrc:  Oral Oral   SpO2:  99% 100%   Weight:    116.4 kg   SpO2: 100 % O2 Flow Rate (L/min): 2 L/min   Intake/Output Summary (Last 24 hours) at 03/21/2022 0926 Last data filed at  03/21/2022 0851 Gross per 24 hour  Intake 480 ml  Output 3225 ml  Net -2745 ml    Filed Weights   03/19/22 0431 03/21/22 0429  Weight: 116.1 kg 116.4 kg    Exam: General exam: In no acute distress. Respiratory system: Good air movement and clear to auscultation. Cardiovascular system: S1 & S2 heard, RRR. No JVD. Gastrointestinal system: Abdomen is nondistended, soft and nontender.  Extremities: No pedal edema. Skin: No rashes, lesions or ulcers Psychiatry: Judgement and insight appear normal. Mood & affect appropriate.  Data Reviewed:    Labs: Basic Metabolic Panel: Recent Labs  Lab 03/17/22 1440 03/18/22 0404 03/19/22 0754 03/20/22 1116 03/21/22 0751  NA 134* 139 139 138 139  K 4.3 4.1 4.1 4.1 3.7  CL 112* 113* 111 109 110  CO2 17* 20* 21* 22 21*  GLUCOSE 171* 104* 113* 100* 136*  BUN 57* 53* 60* 56* 58*  CREATININE 3.22* 3.17* 3.32* 3.01* 2.87*  CALCIUM 7.8* 8.0* 8.0*  7.9* 8.1*    GFR Estimated Creatinine Clearance: 33.9 mL/min (A) (by C-G formula based on SCr of 2.87 mg/dL (H)). Liver Function Tests: Recent Labs  Lab 03/17/22 1440 03/18/22 0404  AST 16 10*  ALT 18 15  ALKPHOS 115 91  BILITOT 0.5 0.4  PROT 7.0 6.4*  ALBUMIN 2.7* 2.5*    No results for input(s): "LIPASE", "AMYLASE" in the last 168 hours. No results for input(s): "AMMONIA" in the last 168 hours. Coagulation profile No results for input(s): "INR", "PROTIME" in the last 168 hours. COVID-19 Labs  Recent Labs    03/19/22 0754  FERRITIN 235     Lab Results  Component Value Date   SARSCOV2NAA NEGATIVE 03/17/2022   [place] NEGATIVE 12/08/2021   [place] NEGATIVE 10/19/2021   [place] NEGATIVE 03/12/2021    CBC: Recent Labs  Lab 03/17/22 1440 03/18/22 0404 03/19/22 0754 03/20/22 1116 03/21/22 0751  WBC 6.3 5.2 4.1 4.0 3.3*  NEUTROABS 4.9 3.3  --   --   --   HGB 8.1* 7.4* 7.5* 7.9* 7.6*  HCT 26.6* 24.8* 24.9* 25.4* 24.7*  MCV 83.9 84.1 83.8 82.7 82.9  PLT 174  162 162 167 175    Cardiac Enzymes: No results for input(s): "CKTOTAL", "CKMB", "CKMBINDEX", "TROPONINI" in the last 168 hours. BNP (last 3 results) No results for input(s): "PROBNP" in the last 8760 hours. CBG: Recent Labs  Lab 03/20/22 0718 03/20/22 1204 03/20/22 1642 03/20/22 2021 03/21/22 0750  GLUCAP 110* 117* 150* 139* 197*    D-Dimer: No results for input(s): "DDIMER" in the last 72 hours. Hgb A1c: Recent Labs    03/18/22 1700  HGBA1C 6.1*    Lipid Profile: No results for input(s): "CHOL", "HDL", "LDLCALC", "TRIG", "CHOLHDL", "LDLDIRECT" in the last 72 hours. Thyroid function studies: No results for input(s): "TSH", "T4TOTAL", "T3FREE", "THYROIDAB" in the last 72 hours.  Invalid input(s): "FREET3" Anemia work up: Recent Labs    03/19/22 0754  VITAMINB12 321  FOLATE 9.3  FERRITIN 235  TIBC 220*  IRON 26*  RETICCTPCT 1.5    Sepsis Labs: Recent Labs  Lab 03/18/22 0404 03/18/22 1700 03/19/22 0754 03/20/22 1116 03/21/22 0751  PROCALCITON <0.10  --   --   --   --   WBC 5.2  --  4.1 4.0 3.3*  LATICACIDVEN  --  0.7  --   --   --     Microbiology Recent Results (from the past 240 hour(s))  Resp panel by RT-PCR (RSV, Flu A&B, Covid) Anterior Nasal Swab     Status: None   Collection Time: 03/17/22  3:00 PM   Specimen: Anterior Nasal Swab  Result Value Ref Range Status   SARS Coronavirus 2 by RT PCR NEGATIVE NEGATIVE Final    Comment: (NOTE) SARS-CoV-2 target nucleic acids are NOT DETECTED.  The SARS-CoV-2 RNA is generally detectable in upper respiratory specimens during the acute phase of infection. The lowest concentration of SARS-CoV-2 viral copies this assay can detect is 138 copies/mL. A negative result does not preclude SARS-Cov-2 infection and should not be used as the sole basis for treatment or other patient management decisions. A negative result may occur with  improper specimen collection/handling, submission of specimen other than  nasopharyngeal swab, presence of viral mutation(s) within the areas targeted by this assay, and inadequate number of viral copies(<138 copies/mL). A negative result must be combined with clinical observations, patient history, and epidemiological information. The expected result is Negative.  Fact Sheet for Patients:  EntrepreneurPulse.com.au  Fact Sheet for Healthcare Providers:  IncredibleEmployment.be  This test is no t yet approved or cleared by the [place] FDA and  has been authorized for detection and/or diagnosis of SARS-CoV-2 by FDA under an Emergency Use Authorization (EUA). This EUA will remain  in effect (meaning this test can be used) for the duration of the COVID-19 declaration under Section 564(b)(1) of the Act, 21 U.S.C.section 360bbb-3(b)(1), unless the authorization is terminated  or revoked sooner.       Influenza A by PCR NEGATIVE NEGATIVE Final   Influenza B by PCR NEGATIVE NEGATIVE Final    Comment: (NOTE) The Xpert Xpress SARS-CoV-2/FLU/RSV plus assay is intended as an aid in the diagnosis of influenza from Nasopharyngeal swab specimens and should not be used as a sole basis for treatment. Nasal washings and aspirates are unacceptable for Xpert Xpress SARS-CoV-2/FLU/RSV testing.  Fact Sheet for Patients: EntrepreneurPulse.com.au  Fact Sheet for Healthcare Providers: IncredibleEmployment.be  This test is not yet approved or cleared by the [place] FDA and has been authorized for detection and/or diagnosis of SARS-CoV-2 by FDA under an Emergency Use Authorization (EUA). This EUA will remain in effect (meaning this test can be used) for the duration of the COVID-19 declaration under Section 564(b)(1) of the Act, 21 U.S.C. section 360bbb-3(b)(1), unless the authorization is terminated or revoked.     Resp Syncytial Virus by PCR NEGATIVE NEGATIVE Final    Comment:  (NOTE) Fact Sheet for Patients: EntrepreneurPulse.com.au  Fact Sheet for Healthcare Providers: IncredibleEmployment.be  This test is not yet approved or cleared by the [place] FDA and has been authorized for detection and/or diagnosis of SARS-CoV-2 by FDA under an Emergency Use Authorization (EUA). This EUA will remain in effect (meaning this test can be used) for the duration of the COVID-19 declaration under Section 564(b)(1) of the Act, 21 U.S.C. section 360bbb-3(b)(1), unless the authorization is terminated or revoked.  Performed at [facility], [place] [address], [place], [place] [address]   Blood culture (routine x 2)     Status: None (Preliminary result)   Collection Time: 03/17/22 10:14 PM   Specimen: BLOOD  Result Value Ref Range Status   Specimen Description   Final    BLOOD BLOOD LEFT HAND Performed at [place] [address] [place], [place] [address]    Special Requests   Final    BOTTLES DRAWN AEROBIC AND ANAEROBIC Blood Culture results may not be optimal due to an excessive volume of blood received in culture bottles Performed at [place] [address] [place], [place] [address]    Culture   Final    NO GROWTH 3 DAYS Performed at [facility], [place] [address] [place], [place] [address]    Report Status PENDING  Incomplete  Blood culture (routine x 2)     Status: None (Preliminary result)   Collection Time: 03/17/22 10:18 PM   Specimen: BLOOD  Result Value Ref Range Status   Specimen Description   Final    BLOOD BLOOD LEFT FOREARM Performed at [place] [address], [place], [place] [address]    Special Requests   Final    BOTTLES DRAWN AEROBIC AND ANAEROBIC Blood Culture results may not be optimal due to an excessive volume of blood received in culture bottles Performed at [place]  [address], [place],  [address]    Culture   Final    NO GROWTH  3 DAYS Performed at [facility], [place] [address] [place], [place] [address]    Report Status PENDING  Incomplete     Medications:    amLODipine  5 mg Oral Daily   aspirin  81 mg Oral Daily   brimonidine  1 drop Both Eyes BID   carvedilol  3.125 mg Oral BID WC   dorzolamide  1 drop Both Eyes BID   furosemide  80 mg Intravenous Q8H   heparin  5,000 Units Subcutaneous Q8H   insulin aspart  0-5 Units Subcutaneous QHS   insulin aspart  0-9 Units Subcutaneous TID WC   insulin glargine-yfgn  10 Units Subcutaneous QHS   latanoprost  1 drop Both Eyes QHS   pantoprazole  40 mg Oral Daily   pregabalin  75 mg Oral Daily   sertraline  50 mg Oral QODAY   sodium bicarbonate  1,300 mg Oral BID   tamsulosin  0.8 mg Oral QPM   Continuous Infusions:  ferric gluconate (FERRLECIT) IVPB 250 mg (03/21/22 0910)      LOS: 3 days   Charlynne Cousins  [facility]  03/21/2022, 9:26 AM

## 2022-03-21 NOTE — Care Management Important Message (Signed)
Important Message  Patient Details IM Letter given. Name: [name] MRN: [id number] Date of Birth: 08/19/1960   Medicare Important Message Given:  Yes     Kerin Salen 03/21/2022, 3:16 PM

## 2022-03-21 NOTE — Progress Notes (Addendum)
[facility] Progress Note  Subjective: no c/o's, sitting up in bed eating. No SOB. 2325 cc UOP yest which is better than the day prior of 1500 cc per 24h.    Vitals:   03/20/22 1847 03/20/22 2013 03/21/22 0426 03/21/22 0429  BP: (!) 185/100 (!) 172/92 (!) 158/85   Pulse:  73 72   Resp:  19    Temp:  (!) 97.5 F (36.4 C) 97.8 F (36.6 C)   TempSrc:  Oral Oral   SpO2:  99% 100%   Weight:    116.4 kg    Exam: Gen alert, no distress No jvd or bruits Chest bilat basilar rales RRR no MRG Abd soft ntnd no mass or ascites +bs Ext diffuse 2-3+ pitting hip/ abd wall/ stump edema Neuro is alert, Ox 3 , R hemiparesis chronic       Home meds include - albuterol, norvasc 10, aspirin, lipitor, catapres patch 0.1, colace, lasix 80 bid, hydralazine 25 tid, insulin aspart/ glargine, nepro, protonix, klorcon 20, lyrica, zoloft, sod bicarb      Date                          Creat               eGFR    2021                         1.10- 2.31    2022                         1.25- 2.07 Mar 2021                  2.90 >> 1.21    Aug- sept 2023        1.97- 3.79    Oct 2023                  2.53- 2.61        27- 29 ml/min      1/21/ 24                    3.22     03/18/22                    3.17     UA > 300 prot, 0-5 rbc/ wbc   UNa 99, UCr 51    CXR - pulm edema, CM    Renal US - 11.5/ 11.3 kidneys w/o hydro, +^echo bilat     I/O net = 1.6 L in and 6.0 L out = - 4.4 L    Wts 116 --> 116, no change     Assessment/ Plan: AKI on CKD 4 - b/l creat 2.5- 2.6 in Oct 2023, eGFR 27-29 ml/min. Creat here 3.1 on admission in setting of decomp CHF w/ anasarca and pulm edema. AKI due to decomp CHF vs progression of CKD.  Has been getting IV lasix, we increased to '80mg'$  tid and UOP is picking up at 2500 cc yesterday. Creat has a dropped a bit w/ diuresis. No uremic sxs and no need for RRT at this time. Get renal US. Will follow.  HTN - bp's stable here on norvasc and coreg.  PAD/  bilat LE  amputee - gets around in a wheelchair, lives in SNF Vol overload/ decomp diast CHF - as  above Anemia - due in part to CKD most likely. Tsat 12%, will give additional IV iron '250mg'$  x 2 while here AHRF - 3 L [place] on admission now on RA DM2 H/o CVA - w/ R hemiparesis     Rob Vina Byrd 03/21/2022, 11:21 AM   Recent Labs  Lab 03/17/22 1440 03/18/22 0404 03/19/22 0754 03/20/22 1116 03/21/22 0751  HGB 8.1* 7.4*   < > 7.9* 7.6*  ALBUMIN 2.7* 2.5*  --   --   --   CALCIUM 7.8* 8.0*   < > 7.9* 8.1*  CREATININE 3.22* 3.17*   < > 3.01* 2.87*  K 4.3 4.1   < > 4.1 3.7   < > = values in this interval not displayed.    Recent Labs  Lab 03/19/22 0754  IRON 26*  TIBC 220*  FERRITIN 235    Inpatient medications:  amLODipine  5 mg Oral Daily   aspirin  81 mg Oral Daily   brimonidine  1 drop Both Eyes BID   carvedilol  6.25 mg Oral BID WC   dorzolamide  1 drop Both Eyes BID   furosemide  80 mg Intravenous Q8H   heparin  5,000 Units Subcutaneous Q8H   insulin aspart  0-5 Units Subcutaneous QHS   insulin aspart  0-9 Units Subcutaneous TID WC   insulin aspart  2 Units Subcutaneous TID WC   insulin glargine-yfgn  10 Units Subcutaneous QHS   latanoprost  1 drop Both Eyes QHS   pantoprazole  40 mg Oral Daily   pregabalin  75 mg Oral Daily   sertraline  50 mg Oral QODAY   sodium bicarbonate  1,300 mg Oral BID   tamsulosin  0.8 mg Oral QPM    ferric gluconate (FERRLECIT) IVPB 250 mg (03/21/22 0910)   acetaminophen **OR** acetaminophen, hydrALAZINE, metoprolol tartrate, nitroGLYCERIN, ondansetron **OR** ondansetron (ZOFRAN) IV

## 2022-03-22 DIAGNOSIS — J189 Pneumonia, unspecified organism: Secondary | ICD-10-CM | POA: Diagnosis not present

## 2022-03-22 DIAGNOSIS — R0602 Shortness of breath: Secondary | ICD-10-CM | POA: Diagnosis not present

## 2022-03-22 DIAGNOSIS — I5033 Acute on chronic diastolic (congestive) heart failure: Secondary | ICD-10-CM | POA: Diagnosis not present

## 2022-03-22 DIAGNOSIS — N184 Chronic kidney disease, stage 4 (severe): Secondary | ICD-10-CM | POA: Diagnosis not present

## 2022-03-22 LAB — GLUCOSE, CAPILLARY
Glucose-Capillary: 146 mg/dL — ABNORMAL HIGH (ref 70–99)
Glucose-Capillary: 130 mg/dL — ABNORMAL HIGH (ref 70–99)
Glucose-Capillary: 428 mg/dL — ABNORMAL HIGH (ref 70–99)
Glucose-Capillary: 176 mg/dL — ABNORMAL HIGH (ref 70–99)
Glucose-Capillary: 101 mg/dL — ABNORMAL HIGH (ref 70–99)

## 2022-03-22 LAB — BASIC METABOLIC PANEL
Anion gap: 9 (ref 5–15)
BUN: 64 mg/dL — ABNORMAL HIGH (ref 6–20)
CO2: 23 mmol/L (ref 22–32)
Calcium: 8.2 mg/dL — ABNORMAL LOW (ref 8.9–10.3)
Chloride: 106 mmol/L (ref 98–111)
Creatinine, Ser: 3.05 mg/dL — ABNORMAL HIGH (ref 0.61–1.24)
GFR, Estimated: 23 mL/min — ABNORMAL LOW (ref >60)
Glucose, Bld: 134 mg/dL — ABNORMAL HIGH (ref 70–99)
Potassium: 3.5 mmol/L (ref 3.5–5.1)
Sodium: 138 mmol/L (ref 135–145)

## 2022-03-22 MED ORDER — FUROSEMIDE 10 MG/ML IJ SOLN
60.0000 mg | Freq: Three times a day (TID) | INTRAMUSCULAR | Status: DC
  Administered 2022-03-22 – 2022-03-24 (×5): 60 mg via INTRAVENOUS
  Filled 2022-03-22 (×5): qty 6

## 2022-03-22 MED ORDER — HYDRALAZINE HCL 10 MG PO TABS
10.0000 mg | ORAL_TABLET | Freq: Three times a day (TID) | ORAL | Status: DC
  Administered 2022-03-22 – 2022-03-23 (×4): 10 mg via ORAL
  Filled 2022-03-22 (×4): qty 1

## 2022-03-22 MED ORDER — HYDRALAZINE HCL 10 MG PO TABS: 10.0000 mg | ORAL_TABLET | Freq: Four times a day (QID) | ORAL | Status: DC | PRN

## 2022-03-22 NOTE — Progress Notes (Signed)
[name]  Subjective: no c/o's, UOP 3950 yesterday. BP's remain high. Creat 3.0 today.   Vitals:   03/22/22 0150 03/22/22 0400 03/22/22 0500 03/22/22 1116  BP: (!) 172/91 (!) 173/92  (!) 172/95  Pulse: 71 70  71  Resp: '18 18  20  '$ Temp: 98.3 F (36.8 C) 98.1 F (36.7 C)  98.3 F (36.8 C)  TempSrc: Oral Oral  Oral  SpO2: 95% 100%  100%  Weight:   115.2 kg     Exam: Gen alert, no distress, bedbound No jvd or bruits Chest mostly clear RRR no MRG Abd soft ntnd no mass or ascites +bs Ext diffuse 2-3+ pitting hip/ abd wall/ stump edema Neuro is alert, Ox 3 , R hemiparesis chronic       Home meds include - albuterol, norvasc 10, aspirin, lipitor, catapres patch 0.1, colace, lasix 80 bid, hydralazine 25 tid, insulin aspart/ glargine, nepro, protonix, klorcon 20, lyrica, zoloft, sod bicarb      Date                          Creat               eGFR    2021                         1.10- 2.31    2022                         1.25- 2.07 Mar 2021                  2.90 >> 1.21    Aug- sept 2023        1.97- 3.79    Oct 2023                  2.53- 2.61        27- 29 ml/min      1/21/ 24                    3.22     03/18/22                    3.17     UA > 300 prot, 0-5 rbc/ wbc   UNa 99, UCr 51    CXR - pulm edema, CM    Renal US - 11.5/ 11.3 kidneys w/o hydro, +^echo bilat     I/O net = 1.6 L in and 6.0 L out = - 4.4 L    Wts 116 --> 116, no change     Assessment/ Plan: AKI on CKD 4 - b/l creat 2.5- 2.6 in Oct 2023, eGFR 27-29 ml/min. Creat here 3.1 on admission in setting of decomp CHF w/ anasarca and pulm edema. AKI due to decomp CHF and/or progression of CKD.  Has been getting IV lasix, we increased to '80mg'$  tid and diuresed improved to 2500 cc 1/24 then 3900 cc yesterday. Creat went down then up a bit today w/ diuresis. Still has marked amount of anasarca. Will lower lasix to IV '60mg'$  tid. No uremic sxs and no need for RRT at this time. Will  follow.  HTN - bp's on the high side still, on 3 bp lowering meds   PAD/  bilat LE amputee - gets around in a wheelchair, lives in SNF Vol  overload/ decomp diast CHF - as above Anemia - due in part to CKD most likely. Tsat 12%, will give additional IV iron '250mg'$  x 2 while here AHRF - 3 L [place] on admission now on RA DM2 H/o CVA - w/ R hemiparesis     [name] 03/22/2022, 3:18 PM   Recent Labs  Lab 03/17/22 1440 03/18/22 0404 03/19/22 0754 03/20/22 1116 03/21/22 0751 03/22/22 0956  HGB 8.1* 7.4*   < > 7.9* 7.6*  --   ALBUMIN 2.7* 2.5*  --   --   --   --   CALCIUM 7.8* 8.0*   < > 7.9* 8.1* 8.2*  CREATININE 3.22* 3.17*   < > 3.01* 2.87* 3.05*  K 4.3 4.1   < > 4.1 3.7 3.5   < > = values in this interval not displayed.    Recent Labs  Lab 03/19/22 0754  IRON 26*  TIBC 220*  FERRITIN 235    Inpatient medications:  amLODipine  5 mg Oral Daily   aspirin  81 mg Oral Daily   brimonidine  1 drop Both Eyes BID   carvedilol  6.25 mg Oral BID WC   dorzolamide  1 drop Both Eyes BID   furosemide  80 mg Intravenous Q8H   heparin  5,000 Units Subcutaneous Q8H   hydrALAZINE  10 mg Oral Q8H   insulin aspart  0-5 Units Subcutaneous QHS   insulin aspart  0-9 Units Subcutaneous TID WC   insulin aspart  2 Units Subcutaneous TID WC   insulin glargine-yfgn  10 Units Subcutaneous QHS   latanoprost  1 drop Both Eyes QHS   pantoprazole  40 mg Oral Daily   pregabalin  75 mg Oral Daily   sertraline  50 mg Oral QODAY   sodium bicarbonate  1,300 mg Oral BID   tamsulosin  0.8 mg Oral QPM    ferric gluconate (FERRLECIT) IVPB 250 mg (03/21/22 0910)   acetaminophen **OR** acetaminophen, hydrALAZINE, metoprolol tartrate, nitroGLYCERIN, ondansetron **OR** ondansetron (ZOFRAN) IV

## 2022-03-22 NOTE — Progress Notes (Signed)
[facility] PROGRESS NOTE    Progress Note  [name]  FOY:[id number] DOB: 1960/10/20 DOA: 03/17/2022 PCP: Jodi Marble, MD     Brief Narrative:   [name] is an 62 y.o. male past medical history of chronic diastolic heart failure, chronic kidney disease stage IV, diabetes mellitus type 2, peripheral vascular disease with a BKA brought in for shortness of breath, CT of the chest showed groundglass opacities  Assessment/Plan:   Acute respiratory failure with hypoxia secondary to pulmonary edema/chronic diastolic heart failure: In the setting of that chronic diastolic heart failure. Nephrology was consulted he is negative about 6 L continue IV Lasix, continue Flomax.  Blood pressure is improving with diuresis. Creatinine continues to improve with diuresis. Basic metabolic panels pending this morning.  Chronic kidney disease stage IV: Continue hydralazine and amlodipine. Also resume bicarbonate tablets.  Essential hypertension: Currently on Norvasc, Coreg IV Lasix. Continues to be elevated, add hydralazine. Continue hydralazine oral prn.  Chest pain: Unlikely cardiac improved with GI cocktail.  Peripheral vascular disease/diabetes mellitus type 2: With an A1c of 6.1. Continue Lyrica. Continue long-acting insulin per sliding scale, blood glucose fairly controlled.  Normocytic anemia: Continue to monitor.  Leukopenia: Borderline we will need to follow-up as an outpatient with PCP.  Significant results are present on admission unstageable RN Pressure Injury Documentation: Pressure Injury 03/02/21 Coccyx (Active)  03/02/21 1740  Location: Coccyx  Location Orientation:   Staging:   Wound Description (Comments):   Present on Admission:      Pressure Injury 12/08/21 Other (Comment) Left;Posterior Unstageable - Full thickness tissue loss in which the base of the injury is covered by slough (yellow, tan, gray, green or brown) and/or eschar (tan, brown or  black) in the wound bed. (Active)  12/08/21 2300  Location: Other (Comment)  Location Orientation: Left;Posterior  Staging: Unstageable - Full thickness tissue loss in which the base of the injury is covered by slough (yellow, tan, gray, green or brown) and/or eschar (tan, brown or black) in the wound bed.  Wound Description (Comments):   Present on Admission: Yes      DVT prophylaxis: lovenox Family Communication:none Status is: Inpatient Remains inpatient appropriate because: Remains inpatient for heart failure    Code Status:     Code Status Orders  (From admission, onward)           Start     Ordered   03/17/22 2133  Full code  Continuous       Question:  By:  Answer:  Consent: discussion documented in EHR   03/17/22 2134           Code Status History     Date Active Date Inactive Code Status Order ID Comments User Context   03/17/2022 2101 03/17/2022 2134 Full Code [id number]  Kristopher Oppenheim, DO ED   12/08/2021 2207 12/11/2021 1757 Full Code [id number]  Lenore Cordia, MD ED   10/19/2021 1602 11/01/2021 2202 Full Code [id number]  Eppie Gibson, MD ED   03/01/2021 1507 03/13/2021 0408 Full Code [id number]  Karmen Bongo, MD Inpatient   11/18/2020 2041 12/12/2020 0309 Full Code [id number]  Orene Desanctis, DO ED   08/22/2020 0531 09/22/2020 1954 Full Code [id number]  Etta Quill, DO ED   06/30/2020 1243 07/05/2020 2345 Full Code [id number]  Geradine Girt, DO ED   06/25/2020 [place] 06/28/2020 2024 Full Code [id number]  Norval Morton, MD ED   01/17/2020 1708 01/31/2020 1753 DNR [id number]  Drucilla Chalet,  Freada Bergeron, NP Inpatient   12/31/2019 2311 01/17/2020 1708 Full Code [id number]  [name], MD ED   12/31/2017 1739 01/02/2018 1315 Full Code [id number]  [name], [name], MD Inpatient   09/28/2017 1434 10/01/2017 1948 Full Code [id number]  [name], Yale, DO ED   07/30/2017 2111 08/04/2017 1746 Full Code [id number]  [name], NP Inpatient   07/29/2017 2347 07/30/2017 2033 Full Code  [id number]  Recardo Evangelist, PA-C ED   01/08/2015 2013 01/09/2015 2017 Full Code [id number]  [name], MD Inpatient   01/04/2015 1915 01/08/2015 2013 Full Code [id number]  [name], MD Inpatient   12/01/2014 1735 12/02/2014 1242 Full Code [id number]  [name], MD ED   08/28/2013 1748 08/31/2013 1627 Full Code [id number]  [name], NP Inpatient   08/28/2013 1301 08/28/2013 1748 Full Code [id number]  [name], MD ED   05/10/2011 2141 05/12/2011 1747 Full Code [id number]  [name], PA-C ED         IV Access:   Peripheral IV   Procedures and diagnostic studies:   US RENAL  Result Date: 03/20/2022 CLINICAL DATA:  Acute renal failure superimposed on chronic kidney disease. EXAM: RENAL / URINARY TRACT ULTRASOUND COMPLETE COMPARISON:  Renal ultrasound 10/23/2021 FINDINGS: Right Kidney: Renal measurements: 11.5 x 5.9 x 6.1 cm = volume: 216 mL. Echogenicity is mildly increased. No mass or hydronephrosis visualized. Left Kidney: Renal measurements: 11.3 x 6.6 x 6.6 cm = volume: 260 mL. Echogenicity is mildly increased. No mass or hydronephrosis visualized. Bladder: Appears normal for degree of bladder distention. Bilateral ureteral jets are visualized. Other: None. IMPRESSION: Mildly increased echogenicity of the bilateral kidneys, as can be seen in medical renal disease. No hydronephrosis. Electronically Signed   By: Ronney Asters M.D.   On: 03/20/2022 15:32     Medical Consultants:   None.   Subjective:    [name] relates his breathing is improved has shoulder pain Objective:    Vitals:   03/21/22 2308 03/22/22 0150 03/22/22 0400 03/22/22 0500  BP: (!) 177/93 (!) 172/91 (!) 173/92   Pulse: 73 71 70   Resp: '18 18 18   '$ Temp: 98 F (36.7 C) 98.3 F (36.8 C) 98.1 F (36.7 C)   TempSrc: Oral Oral Oral   SpO2: 100% 95% 100%   Weight:    115.2 kg   SpO2: 100 % O2 Flow Rate (L/min): 2 L/min   Intake/Output Summary (Last 24 hours) at 03/22/2022  0920 Last data filed at 03/22/2022 0657 Gross per 24 hour  Intake 970.75 ml  Output 3100 ml  Net -2129.25 ml    Filed Weights   03/19/22 0431 03/21/22 0429 03/22/22 0500  Weight: 116.1 kg 116.4 kg 115.2 kg    Exam: General exam: In no acute distress. Respiratory system: Good air movement and clear to auscultation. Cardiovascular system: S1 & S2 heard, RRR. No JVD. Gastrointestinal system: Abdomen is nondistended, soft and nontender.  Extremities: Right shoulder pain reproducible by palpation. Skin: No rashes, lesions or ulcers Psychiatry: Judgement and insight appear normal. Mood & affect appropriate.  Data Reviewed:    Labs: Basic Metabolic Panel: Recent Labs  Lab 03/17/22 1440 03/18/22 0404 03/19/22 0754 03/20/22 1116 03/21/22 0751  NA 134* 139 139 138 139  K 4.3 4.1 4.1 4.1 3.7  CL 112* 113* 111 109 110  CO2 17* 20* 21* 22 21*  GLUCOSE 171* 104* 113* 100* 136*  BUN 57* 53* 60* 56* 58*  CREATININE  3.22* 3.17* 3.32* 3.01* 2.87*  CALCIUM 7.8* 8.0* 8.0* 7.9* 8.1*    GFR Estimated Creatinine Clearance: 33.7 mL/min (A) (by C-G formula based on SCr of 2.87 mg/dL (H)). Liver Function Tests: Recent Labs  Lab 03/17/22 1440 03/18/22 0404  AST 16 10*  ALT 18 15  ALKPHOS 115 91  BILITOT 0.5 0.4  PROT 7.0 6.4*  ALBUMIN 2.7* 2.5*    No results for input(s): "LIPASE", "AMYLASE" in the last 168 hours. No results for input(s): "AMMONIA" in the last 168 hours. Coagulation profile No results for input(s): "INR", "PROTIME" in the last 168 hours. COVID-19 Labs  No results for input(s): "DDIMER", "FERRITIN", "LDH", "CRP" in the last 72 hours.   Lab Results  Component Value Date   SARSCOV2NAA NEGATIVE 03/17/2022   [place] NEGATIVE 12/08/2021   [place] NEGATIVE 10/19/2021   [place] NEGATIVE 03/12/2021    CBC: Recent Labs  Lab 03/17/22 1440 03/18/22 0404 03/19/22 0754 03/20/22 1116 03/21/22 0751  WBC 6.3 5.2 4.1 4.0 3.3*  NEUTROABS 4.9 3.3  --    --   --   HGB 8.1* 7.4* 7.5* 7.9* 7.6*  HCT 26.6* 24.8* 24.9* 25.4* 24.7*  MCV 83.9 84.1 83.8 82.7 82.9  PLT 174 162 162 167 175    Cardiac Enzymes: No results for input(s): "CKTOTAL", "CKMB", "CKMBINDEX", "TROPONINI" in the last 168 hours. BNP (last 3 results) No results for input(s): "PROBNP" in the last 8760 hours. CBG: Recent Labs  Lab 03/21/22 0750 03/21/22 1216 03/21/22 1638 03/21/22 2014 03/22/22 0806  GLUCAP 197* 141* 131* 134* 146*    D-Dimer: No results for input(s): "DDIMER" in the last 72 hours. Hgb A1c: No results for input(s): "HGBA1C" in the last 72 hours.  Lipid Profile: No results for input(s): "CHOL", "HDL", "LDLCALC", "TRIG", "CHOLHDL", "LDLDIRECT" in the last 72 hours. Thyroid function studies: No results for input(s): "TSH", "T4TOTAL", "T3FREE", "THYROIDAB" in the last 72 hours.  Invalid input(s): "FREET3" Anemia work up: No results for input(s): "VITAMINB12", "FOLATE", "FERRITIN", "TIBC", "IRON", "RETICCTPCT" in the last 72 hours.  Sepsis Labs: Recent Labs  Lab 03/18/22 0404 03/18/22 1700 03/19/22 0754 03/20/22 1116 03/21/22 0751  PROCALCITON <0.10  --   --   --   --   WBC 5.2  --  4.1 4.0 3.3*  LATICACIDVEN  --  0.7  --   --   --     Microbiology Recent Results (from the past 240 hour(s))  Resp panel by RT-PCR (RSV, Flu A&B, Covid) Anterior Nasal Swab     Status: None   Collection Time: 03/17/22  3:00 PM   Specimen: Anterior Nasal Swab  Result Value Ref Range Status   SARS Coronavirus 2 by RT PCR NEGATIVE NEGATIVE Final    Comment: (NOTE) SARS-CoV-2 target nucleic acids are NOT DETECTED.  The SARS-CoV-2 RNA is generally detectable in upper respiratory specimens during the acute phase of infection. The lowest concentration of SARS-CoV-2 viral copies this assay can detect is 138 copies/mL. A negative result does not preclude SARS-Cov-2 infection and should not be used as the sole basis for treatment or other patient management  decisions. A negative result may occur with  improper specimen collection/handling, submission of specimen other than nasopharyngeal swab, presence of viral mutation(s) within the areas targeted by this assay, and inadequate number of viral copies(<138 copies/mL). A negative result must be combined with clinical observations, patient history, and epidemiological information. The expected result is Negative.  Fact Sheet for Patients:  EntrepreneurPulse.com.au  Fact Sheet for  Healthcare Providers:  IncredibleEmployment.be  This test is no t yet approved or cleared by the [place] and  has been authorized for detection and/or diagnosis of SARS-CoV-2 by FDA under an Emergency Use Authorization (EUA). This EUA will remain  in effect (meaning this test can be used) for the duration of the COVID-19 declaration under Section 564(b)(1) of the Act, 21 U.S.C.section 360bbb-3(b)(1), unless the authorization is terminated  or revoked sooner.       Influenza A by PCR NEGATIVE NEGATIVE Final   Influenza B by PCR NEGATIVE NEGATIVE Final    Comment: (NOTE) The Xpert Xpress SARS-CoV-2/FLU/RSV plus assay is intended as an aid in the diagnosis of influenza from Nasopharyngeal swab specimens and should not be used as a sole basis for treatment. Nasal washings and aspirates are unacceptable for Xpert Xpress SARS-CoV-2/FLU/RSV testing.  Fact Sheet for Patients: EntrepreneurPulse.com.au  Fact Sheet for Healthcare Providers: IncredibleEmployment.be  This test is not yet approved or cleared by the [place] FDA and has been authorized for detection and/or diagnosis of SARS-CoV-2 by FDA under an Emergency Use Authorization (EUA). This EUA will remain in effect (meaning this test can be used) for the duration of the COVID-19 declaration under Section 564(b)(1) of the Act, 21 U.S.C. section 360bbb-3(b)(1), unless the  authorization is terminated or revoked.     Resp Syncytial Virus by PCR NEGATIVE NEGATIVE Final    Comment: (NOTE) Fact Sheet for Patients: EntrepreneurPulse.com.au  Fact Sheet for Healthcare Providers: IncredibleEmployment.be  This test is not yet approved or cleared by the [place] FDA and has been authorized for detection and/or diagnosis of SARS-CoV-2 by FDA under an Emergency Use Authorization (EUA). This EUA will remain in effect (meaning this test can be used) for the duration of the COVID-19 declaration under Section 564(b)(1) of the Act, 21 U.S.C. section 360bbb-3(b)(1), unless the authorization is terminated or revoked.  Performed at [facility], [place] [address] [place], [place] [address]   Blood culture (routine x 2)     Status: None (Preliminary result)   Collection Time: 03/17/22 10:14 PM   Specimen: BLOOD  Result Value Ref Range Status   Specimen Description   Final    BLOOD BLOOD LEFT HAND Performed at [facility] [address] [place], [place] [address]    Special Requests   Final    BOTTLES DRAWN AEROBIC AND ANAEROBIC Blood Culture results may not be optimal due to an excessive volume of blood received in culture bottles Performed at [facility] [address], [place], [place] [address]    Culture   Final    NO GROWTH 3 DAYS Performed at [facility], [place] [address] [place], [place] [address]    Report Status PENDING  Incomplete  Blood culture (routine x 2)     Status: None (Preliminary result)   Collection Time: 03/17/22 10:18 PM   Specimen: BLOOD  Result Value Ref Range Status   Specimen Description   Final    BLOOD BLOOD LEFT FOREARM Performed at [facility] [address] [place], [place] [address]    Special Requests   Final    BOTTLES DRAWN AEROBIC AND ANAEROBIC Blood Culture results may not be optimal due to an  excessive volume of blood received in culture bottles Performed at [facility] [address], [place], [place] [address]    Culture   Final    NO GROWTH 3 DAYS Performed  at [facility], [place] [address] [place], [place] [address]    Report Status PENDING  Incomplete     Medications:    amLODipine  5 mg Oral Daily   aspirin  81 mg Oral Daily   brimonidine  1 drop Both Eyes BID   carvedilol  6.25 mg Oral BID WC   dorzolamide  1 drop Both Eyes BID   furosemide  80 mg Intravenous Q8H   heparin  5,000 Units Subcutaneous Q8H   insulin aspart  0-5 Units Subcutaneous QHS   insulin aspart  0-9 Units Subcutaneous TID WC   insulin aspart  2 Units Subcutaneous TID WC   insulin glargine-yfgn  10 Units Subcutaneous QHS   latanoprost  1 drop Both Eyes QHS   pantoprazole  40 mg Oral Daily   pregabalin  75 mg Oral Daily   sertraline  50 mg Oral QODAY   sodium bicarbonate  1,300 mg Oral BID   tamsulosin  0.8 mg Oral QPM   Continuous Infusions:  ferric gluconate (FERRLECIT) IVPB 250 mg (03/21/22 0910)      LOS: 4 days   Charlynne Cousins  [facility]  03/22/2022, 9:20 AM

## 2022-03-23 ENCOUNTER — Encounter (HOSPITAL_COMMUNITY): Payer: Self-pay | Admitting: Family Medicine

## 2022-03-23 ENCOUNTER — Inpatient Hospital Stay (HOSPITAL_COMMUNITY): Payer: Medicare (Managed Care)

## 2022-03-23 DIAGNOSIS — I5033 Acute on chronic diastolic (congestive) heart failure: Secondary | ICD-10-CM | POA: Diagnosis not present

## 2022-03-23 DIAGNOSIS — J189 Pneumonia, unspecified organism: Secondary | ICD-10-CM | POA: Diagnosis not present

## 2022-03-23 DIAGNOSIS — R0602 Shortness of breath: Secondary | ICD-10-CM | POA: Diagnosis not present

## 2022-03-23 DIAGNOSIS — N184 Chronic kidney disease, stage 4 (severe): Secondary | ICD-10-CM | POA: Diagnosis not present

## 2022-03-23 LAB — GLUCOSE, CAPILLARY
Glucose-Capillary: 115 mg/dL — ABNORMAL HIGH (ref 70–99)
Glucose-Capillary: 130 mg/dL — ABNORMAL HIGH (ref 70–99)
Glucose-Capillary: 127 mg/dL — ABNORMAL HIGH (ref 70–99)
Glucose-Capillary: 157 mg/dL — ABNORMAL HIGH (ref 70–99)

## 2022-03-23 LAB — CULTURE, BLOOD (ROUTINE X 2)
Culture: NO GROWTH
Culture: NO GROWTH

## 2022-03-23 LAB — BASIC METABOLIC PANEL
Anion gap: 8 (ref 5–15)
BUN: 64 mg/dL — ABNORMAL HIGH (ref 6–20)
CO2: 23 mmol/L (ref 22–32)
Calcium: 8 mg/dL — ABNORMAL LOW (ref 8.9–10.3)
Chloride: 106 mmol/L (ref 98–111)
Creatinine, Ser: 3.13 mg/dL — ABNORMAL HIGH (ref 0.61–1.24)
GFR, Estimated: 22 mL/min — ABNORMAL LOW (ref >60)
Glucose, Bld: 117 mg/dL — ABNORMAL HIGH (ref 70–99)
Potassium: 3.5 mmol/L (ref 3.5–5.1)
Sodium: 137 mmol/L (ref 135–145)

## 2022-03-23 MED ORDER — HYDRALAZINE HCL 25 MG PO TABS: 25.0000 mg | ORAL_TABLET | Freq: Three times a day (TID) | ORAL | Status: DC | PRN

## 2022-03-23 MED ORDER — AMLODIPINE BESYLATE 10 MG PO TABS
10.0000 mg | ORAL_TABLET | Freq: Every day | ORAL | Status: DC
  Administered 2022-03-24 – 2022-04-02 (×10): 10 mg via ORAL
  Filled 2022-03-23 (×10): qty 1

## 2022-03-23 MED ORDER — HYDRALAZINE HCL 25 MG PO TABS
25.0000 mg | ORAL_TABLET | Freq: Three times a day (TID) | ORAL | Status: DC
  Administered 2022-03-23 – 2022-03-24 (×2): 25 mg via ORAL
  Filled 2022-03-23 (×2): qty 1

## 2022-03-23 MED ORDER — CARVEDILOL 12.5 MG PO TABS
12.5000 mg | ORAL_TABLET | Freq: Two times a day (BID) | ORAL | Status: DC
  Administered 2022-03-24 – 2022-04-02 (×19): 12.5 mg via ORAL
  Filled 2022-03-23 (×19): qty 1

## 2022-03-23 NOTE — Progress Notes (Signed)
[facility] PROGRESS NOTE    Progress Note  JIYAAN STEINHAUSER  UXN:[id number] DOB: [birth year] DOA: 03/17/2022 PCP: Jodi Marble, MD     Brief Narrative:   [name] is an 62 y.o. male past medical history of chronic diastolic heart failure, chronic kidney disease stage IV, diabetes mellitus type 2, peripheral vascular disease with a BKA brought in for shortness of breath, CT of the chest showed groundglass opacities  Assessment/Plan:   Acute respiratory failure with hypoxia secondary to pulmonary edema/chronic diastolic heart failure: In the setting of that chronic diastolic heart failure. Nephrology was consulted he is negative about 8.5 L continue IV Lasix, continue Flomax.  Blood pressure remains high. Creatinine continues to improve with diuresis. Basic metabolic panels pending this morning.  Chronic kidney disease stage IV: Cont bicarbonate tablets.  Essential hypertension: Currently on Norvasc, Coreg IV Lasix. Continues to be elevated, increase hydralazine. Continue hydralazine oral prn.  Chest pain: Unlikely cardiac improved with GI cocktail.  Peripheral vascular disease/diabetes mellitus type 2: With an A1c of 6.1. Continue Lyrica. Continue long-acting insulin per sliding scale, blood glucose fairly controlled.  Normocytic anemia: Continue to monitor.  Leukopenia: Borderline we will need to follow-up as an outpatient with PCP.  Significant results are present on admission unstageable RN Pressure Injury Documentation: Pressure Injury 03/02/21 Coccyx (Active)  03/02/21 1740  Location: Coccyx  Location Orientation:   Staging:   Wound Description (Comments):   Present on Admission:      Pressure Injury 12/08/21 Other (Comment) Left;Posterior Unstageable - Full thickness tissue loss in which the base of the injury is covered by slough (yellow, tan, gray, green or brown) and/or eschar (tan, brown or black) in the wound bed. (Active)  12/08/21 2300   Location: Other (Comment)  Location Orientation: Left;Posterior  Staging: Unstageable - Full thickness tissue loss in which the base of the injury is covered by slough (yellow, tan, gray, green or brown) and/or eschar (tan, brown or black) in the wound bed.  Wound Description (Comments):   Present on Admission: Yes      DVT prophylaxis: lovenox Family Communication:none Status is: Inpatient Remains inpatient appropriate because: Remains inpatient for heart failure    Code Status:     Code Status Orders  (From admission, onward)           Start     Ordered   03/17/22 2133  Full code  Continuous       Question:  By:  Answer:  Consent: discussion documented in EHR   03/17/22 2134           Code Status History     Date Active Date Inactive Code Status Order ID Comments User Context   03/17/2022 2101 03/17/2022 2134 Full Code [id number]  Kristopher Oppenheim, DO ED   12/08/2021 2207 12/11/2021 1757 Full Code [id number]  Lenore Cordia, MD ED   10/19/2021 1602 11/01/2021 2202 Full Code [id number]  Eppie Gibson, MD ED   03/01/2021 1507 03/13/2021 0408 Full Code [id number]  Karmen Bongo, MD Inpatient   11/18/2020 2041 12/12/2020 0309 Full Code [id number]  Orene Desanctis, DO ED   08/22/2020 0531 09/22/2020 1954 Full Code [id number]  Etta Quill, DO ED   06/30/2020 1243 07/05/2020 2345 Full Code [id number]  Geradine Girt, DO ED   06/25/2020 0944 06/28/2020 2024 Full Code [id number]  Norval Morton, MD ED   01/17/2020 1708 01/31/2020 1753 DNR [id number]  Rosezella Rumpf, NP Inpatient   12/31/2019  2311 01/17/2020 1708 Full Code [id number]  [name], MD ED   12/31/2017 1739 01/02/2018 1315 Full Code [id number]  [name], [name], MD Inpatient   09/28/2017 1434 10/01/2017 1948 Full Code [id number]  [name], Henryetta, DO ED   07/30/2017 2111 08/04/2017 1746 Full Code [id number]  [name], NP Inpatient   07/29/2017 2347 07/30/2017 2033 Full Code [id number]  Recardo Evangelist, PA-C ED   01/08/2015 2013  01/09/2015 2017 Full Code [id number]  [name], MD Inpatient   01/04/2015 1915 01/08/2015 2013 Full Code [id number]  [name], MD Inpatient   12/01/2014 1735 12/02/2014 1242 Full Code [id number]  [name], MD ED   08/28/2013 1748 08/31/2013 1627 Full Code [id number]  [name], NP Inpatient   08/28/2013 1301 08/28/2013 1748 Full Code [id number]  [name], MD ED   05/10/2011 2141 05/12/2011 1747 Full Code [id number]  [name], PA-C ED         IV Access:   Peripheral IV   Procedures and diagnostic studies:   DG CHEST PORT 1 VIEW  Result Date: 03/23/2022 CLINICAL DATA:  Follow-up exam.  Anasarca.  Chronic renal disease. EXAM: PORTABLE CHEST 1 VIEW COMPARISON:  March 17, 2022 FINDINGS: Improving bilateral pulmonary infiltrates. Cardiomegaly. The hila and mediastinum are unchanged. No pneumothorax. No other acute abnormalities. IMPRESSION: Improving bilateral pulmonary opacities thought to represent pulmonary edema on the March 17, 2022 CT scan of the chest. Electronically Signed   By: Dorise Bullion III M.D.   On: 03/23/2022 13:18     Medical Consultants:   None.   Subjective:    JAESON MOLSTAD relates he cont to improve Objective:    Vitals:   03/23/22 0639 03/23/22 1202 03/23/22 1640 03/23/22 1720  BP:  (!) 187/93 (!) 177/99 (!) 177/101  Pulse:  73 74   Resp:  20    Temp:  97.6 F (36.4 C)    TempSrc:  Oral    SpO2:  100%    Weight: 113.9 kg     Height: '5\' 8"'$  (1.727 m)      SpO2: 100 % O2 Flow Rate (L/min): 2 L/min   Intake/Output Summary (Last 24 hours) at 03/23/2022 1734 Last data filed at 03/23/2022 1646 Gross per 24 hour  Intake 1289.25 ml  Output 2600 ml  Net -1310.75 ml    Filed Weights   03/21/22 0429 03/22/22 0500 03/23/22 0639  Weight: 116.4 kg 115.2 kg 113.9 kg    Exam: General exam: In no acute distress. Respiratory system: Good air movement and clear to auscultation. Cardiovascular system: S1 & S2 heard, RRR.  No JVD. Gastrointestinal system: Abdomen is nondistended, soft and nontender.  Extremities: Right shoulder pain reproducible by palpation. Skin: No rashes, lesions or ulcers Psychiatry: Judgement and insight appear normal. Mood & affect appropriate.  Data Reviewed:    Labs: Basic Metabolic Panel: Recent Labs  Lab 03/19/22 0754 03/20/22 1116 03/21/22 0751 03/22/22 0956 03/23/22 0755  NA 139 138 139 138 137  K 4.1 4.1 3.7 3.5 3.5  CL 111 109 110 106 106  CO2 21* 22 21* 23 23  GLUCOSE 113* 100* 136* 134* 117*  BUN 60* 56* 58* 64* 64*  CREATININE 3.32* 3.01* 2.87* 3.05* 3.13*  CALCIUM 8.0* 7.9* 8.1* 8.2* 8.0*    GFR Estimated Creatinine Clearance: 30.7 mL/min (A) (by C-G formula based on SCr of 3.13 mg/dL (H)). Liver Function Tests: Recent Labs  Lab 03/17/22 1440 03/18/22 0404  AST 16 10*  ALT 18 15  ALKPHOS 115 91  BILITOT 0.5 0.4  PROT 7.0 6.4*  ALBUMIN 2.7* 2.5*    No results for input(s): "LIPASE", "AMYLASE" in the last 168 hours. No results for input(s): "AMMONIA" in the last 168 hours. Coagulation profile No results for input(s): "INR", "PROTIME" in the last 168 hours. COVID-19 Labs  No results for input(s): "DDIMER", "FERRITIN", "LDH", "CRP" in the last 72 hours.   Lab Results  Component Value Date   SARSCOV2NAA NEGATIVE 03/17/2022   [place] NEGATIVE 12/08/2021   [place] NEGATIVE 10/19/2021   [place] NEGATIVE 03/12/2021    CBC: Recent Labs  Lab 03/17/22 1440 03/18/22 0404 03/19/22 0754 03/20/22 1116 03/21/22 0751  WBC 6.3 5.2 4.1 4.0 3.3*  NEUTROABS 4.9 3.3  --   --   --   HGB 8.1* 7.4* 7.5* 7.9* 7.6*  HCT 26.6* 24.8* 24.9* 25.4* 24.7*  MCV 83.9 84.1 83.8 82.7 82.9  PLT 174 162 162 167 175    Cardiac Enzymes: No results for input(s): "CKTOTAL", "CKMB", "CKMBINDEX", "TROPONINI" in the last 168 hours. BNP (last 3 results) No results for input(s): "PROBNP" in the last 8760 hours. CBG: Recent Labs  Lab 03/22/22 1758  03/22/22 1936 03/23/22 0844 03/23/22 1158 03/23/22 1624  GLUCAP 176* 101* 115* 130* 127*    D-Dimer: No results for input(s): "DDIMER" in the last 72 hours. Hgb A1c: No results for input(s): "HGBA1C" in the last 72 hours.  Lipid Profile: No results for input(s): "CHOL", "HDL", "LDLCALC", "TRIG", "CHOLHDL", "LDLDIRECT" in the last 72 hours. Thyroid function studies: No results for input(s): "TSH", "T4TOTAL", "T3FREE", "THYROIDAB" in the last 72 hours.  Invalid input(s): "FREET3" Anemia work up: No results for input(s): "VITAMINB12", "FOLATE", "FERRITIN", "TIBC", "IRON", "RETICCTPCT" in the last 72 hours.  Sepsis Labs: Recent Labs  Lab 03/18/22 0404 03/18/22 1700 03/19/22 0754 03/20/22 1116 03/21/22 0751  PROCALCITON <0.10  --   --   --   --   WBC 5.2  --  4.1 4.0 3.3*  LATICACIDVEN  --  0.7  --   --   --     Microbiology Recent Results (from the past 240 hour(s))  Resp panel by RT-PCR (RSV, Flu A&B, Covid) Anterior Nasal Swab     Status: None   Collection Time: 03/17/22  3:00 PM   Specimen: Anterior Nasal Swab  Result Value Ref Range Status   SARS Coronavirus 2 by RT PCR NEGATIVE NEGATIVE Final    Comment: (NOTE) SARS-CoV-2 target nucleic acids are NOT DETECTED.  The SARS-CoV-2 RNA is generally detectable in upper respiratory specimens during the acute phase of infection. The lowest concentration of SARS-CoV-2 viral copies this assay can detect is 138 copies/mL. A negative result does not preclude SARS-Cov-2 infection and should not be used as the sole basis for treatment or other patient management decisions. A negative result may occur with  improper specimen collection/handling, submission of specimen other than nasopharyngeal swab, presence of viral mutation(s) within the areas targeted by this assay, and inadequate number of viral copies(<138 copies/mL). A negative result must be combined with clinical observations, patient history, and  epidemiological information. The expected result is Negative.  Fact Sheet for Patients:  EntrepreneurPulse.com.au  Fact Sheet for Healthcare Providers:  IncredibleEmployment.be  This test is no t yet approved or cleared by the [place] FDA and  has been authorized for detection and/or diagnosis of SARS-CoV-2 by FDA under an Emergency Use Authorization (EUA). This EUA will remain  in effect (meaning this test  can be used) for the duration of the COVID-19 declaration under Section 564(b)(1) of the Act, 21 U.S.C.section 360bbb-3(b)(1), unless the authorization is terminated  or revoked sooner.       Influenza A by PCR NEGATIVE NEGATIVE Final   Influenza B by PCR NEGATIVE NEGATIVE Final    Comment: (NOTE) The Xpert Xpress SARS-CoV-2/FLU/RSV plus assay is intended as an aid in the diagnosis of influenza from Nasopharyngeal swab specimens and should not be used as a sole basis for treatment. Nasal washings and aspirates are unacceptable for Xpert Xpress SARS-CoV-2/FLU/RSV testing.  Fact Sheet for Patients: EntrepreneurPulse.com.au  Fact Sheet for Healthcare Providers: IncredibleEmployment.be  This test is not yet approved or cleared by the [place] FDA and has been authorized for detection and/or diagnosis of SARS-CoV-2 by FDA under an Emergency Use Authorization (EUA). This EUA will remain in effect (meaning this test can be used) for the duration of the COVID-19 declaration under Section 564(b)(1) of the Act, 21 U.S.C. section 360bbb-3(b)(1), unless the authorization is terminated or revoked.     Resp Syncytial Virus by PCR NEGATIVE NEGATIVE Final    Comment: (NOTE) Fact Sheet for Patients: EntrepreneurPulse.com.au  Fact Sheet for Healthcare Providers: IncredibleEmployment.be  This test is not yet approved or cleared by the [place] FDA and has been  authorized for detection and/or diagnosis of SARS-CoV-2 by FDA under an Emergency Use Authorization (EUA). This EUA will remain in effect (meaning this test can be used) for the duration of the COVID-19 declaration under Section 564(b)(1) of the Act, 21 U.S.C. section 360bbb-3(b)(1), unless the authorization is terminated or revoked.  Performed at [facility], [place] [address], [place], [place] [address]   Blood culture (routine x 2)     Status: None   Collection Time: 03/17/22 10:14 PM   Specimen: BLOOD  Result Value Ref Range Status   Specimen Description   Final    BLOOD BLOOD LEFT HAND Performed at [facility] [address], [place], [place] [address]    Special Requests   Final    BOTTLES DRAWN AEROBIC AND ANAEROBIC Blood Culture results may not be optimal due to an excessive volume of blood received in culture bottles Performed at [facility] [address] [place], [place] [address]    Culture   Final    NO GROWTH 5 DAYS Performed at [facility], [place] [address], [place], [place] [address]    Report Status 03/23/2022 FINAL  Final  Blood culture (routine x 2)     Status: None   Collection Time: 03/17/22 10:18 PM   Specimen: BLOOD  Result Value Ref Range Status   Specimen Description   Final    BLOOD BLOOD LEFT FOREARM Performed at [facility] [address] [place], [place] [address]    Special Requests   Final    BOTTLES DRAWN AEROBIC AND ANAEROBIC Blood Culture results may not be optimal due to an excessive volume of blood received in culture bottles Performed at [facility] [address] [place], [place] [address]    Culture   Final    NO GROWTH 5 DAYS Performed at [facility], [place] [address], [place], [place] [address]    Report Status 03/23/2022 FINAL  Final     Medications:    [START ON 03/24/2022] amLODipine  10 mg Oral Daily   aspirin   81 mg Oral Daily   brimonidine  1  drop Both Eyes BID   [START ON 03/24/2022] carvedilol  12.5 mg Oral BID WC   dorzolamide  1 drop Both Eyes BID   furosemide  60 mg Intravenous Q8H   heparin  5,000 Units Subcutaneous Q8H   hydrALAZINE  10 mg Oral Q8H   insulin aspart  0-5 Units Subcutaneous QHS   insulin aspart  0-9 Units Subcutaneous TID WC   insulin aspart  2 Units Subcutaneous TID WC   insulin glargine-yfgn  10 Units Subcutaneous QHS   latanoprost  1 drop Both Eyes QHS   pantoprazole  40 mg Oral Daily   pregabalin  75 mg Oral Daily   sertraline  50 mg Oral QODAY   sodium bicarbonate  1,300 mg Oral BID   tamsulosin  0.8 mg Oral QPM   Continuous Infusions:      LOS: 5 days   Charlynne Cousins  [facility]  03/23/2022, 5:34 PM

## 2022-03-23 NOTE — Progress Notes (Signed)
[facility] Progress Note  Subjective: seen in room, 2.5 L UOP yesterday. Creat 3.0 today.   Vitals:   03/22/22 1939 03/22/22 2155 03/23/22 0607 03/23/22 0639  BP: 122/77 136/84 (!) 176/97   Pulse: 73 73 72   Resp: 20  16   Temp: 98.4 F (36.9 C)  98.3 F (36.8 C)   TempSrc: Oral  Oral   SpO2: 99%  100%   Weight:    113.9 kg  Height:    '5\' 8"'$  (1.727 m)    Exam: Gen alert, no distress, bedbound No jvd or bruits Chest mostly clear RRR no MRG Abd soft ntnd no mass or ascites +bs Ext diffuse 2-3+ pitting hip/ abd wall/ stump edema Neuro is alert, Ox 3 , R hemiparesis chronic       Home meds include - albuterol, norvasc 10, aspirin, lipitor, catapres patch 0.1, colace, lasix 80 bid, hydralazine 25 tid, insulin aspart/ glargine, nepro, protonix, klorcon 20, lyrica, zoloft, sod bicarb      Date                          Creat               eGFR    2021                         1.10- 2.31    2022                         1.25- 2.07 Mar 2021                  2.90 >> 1.21    Aug- sept 2023        1.97- 3.79    Oct 2023                  2.53- 2.61        27- 29 ml/min      1/21/ 24                    3.22     03/18/22                    3.17     UA > 300 prot, 0-5 rbc/ wbc   UNa 99, UCr 51    CXR - pulm edema, CM    Renal US - 11.5/ 11.3 kidneys w/o hydro, +^echo bilat     I/O net = 1.6 L in and 6.0 L out = - 4.4 L    Wts 116 --> 116, no change     Assessment/ Plan: AKI on CKD 4 - b/l creat 2.5- 2.6 in Oct 2023, eGFR 27-29 ml/min. Creat here 3.1 on admission in setting of decomp CHF w/ anasarca and pulm edema. AKI due to decomp CHF and/or progression of CKD.  Has been getting IV lasix. Creat went down then up a bit. Still has marked amount of anasarca. Lowered lasix to IV '60mg'$  tid and will continue for now as his LE edema is still quite prominent. Will follow.  HTN - bp's normal to a bit high, on 3 bp lowering meds   PAD/  bilat LE amputee - gets around in a  wheelchair, lives in SNF Vol overload/ decomp diast CHF - as above Anemia - due in part to CKD most  likely. Tsat 12%, giving additional IV iron '250mg'$  x 2 while here AHRF - 3 L [place] on admission now on RA DM2 H/o CVA - w/ R hemiparesis     [name] 03/23/2022, 11:57 AM   Recent Labs  Lab 03/17/22 1440 03/18/22 0404 03/19/22 0754 03/20/22 1116 03/21/22 0751 03/22/22 0956 03/23/22 0755  HGB 8.1* 7.4*   < > 7.9* 7.6*  --   --   ALBUMIN 2.7* 2.5*  --   --   --   --   --   CALCIUM 7.8* 8.0*   < > 7.9* 8.1* 8.2* 8.0*  CREATININE 3.22* 3.17*   < > 3.01* 2.87* 3.05* 3.13*  K 4.3 4.1   < > 4.1 3.7 3.5 3.5   < > = values in this interval not displayed.    Recent Labs  Lab 03/19/22 0754  IRON 26*  TIBC 220*  FERRITIN 235    Inpatient medications:  amLODipine  5 mg Oral Daily   aspirin  81 mg Oral Daily   brimonidine  1 drop Both Eyes BID   carvedilol  6.25 mg Oral BID WC   dorzolamide  1 drop Both Eyes BID   furosemide  60 mg Intravenous Q8H   heparin  5,000 Units Subcutaneous Q8H   hydrALAZINE  10 mg Oral Q8H   insulin aspart  0-5 Units Subcutaneous QHS   insulin aspart  0-9 Units Subcutaneous TID WC   insulin aspart  2 Units Subcutaneous TID WC   insulin glargine-yfgn  10 Units Subcutaneous QHS   latanoprost  1 drop Both Eyes QHS   pantoprazole  40 mg Oral Daily   pregabalin  75 mg Oral Daily   sertraline  50 mg Oral QODAY   sodium bicarbonate  1,300 mg Oral BID   tamsulosin  0.8 mg Oral QPM    ferric gluconate (FERRLECIT) IVPB 250 mg (03/23/22 1039)   acetaminophen **OR** acetaminophen, hydrALAZINE, metoprolol tartrate, nitroGLYCERIN, ondansetron **OR** ondansetron (ZOFRAN) IV

## 2022-03-24 DIAGNOSIS — J189 Pneumonia, unspecified organism: Secondary | ICD-10-CM | POA: Diagnosis not present

## 2022-03-24 DIAGNOSIS — R0602 Shortness of breath: Secondary | ICD-10-CM | POA: Diagnosis not present

## 2022-03-24 DIAGNOSIS — N184 Chronic kidney disease, stage 4 (severe): Secondary | ICD-10-CM | POA: Diagnosis not present

## 2022-03-24 DIAGNOSIS — I5033 Acute on chronic diastolic (congestive) heart failure: Secondary | ICD-10-CM | POA: Diagnosis not present

## 2022-03-24 LAB — GLUCOSE, CAPILLARY
Glucose-Capillary: 96 mg/dL (ref 70–99)
Glucose-Capillary: 123 mg/dL — ABNORMAL HIGH (ref 70–99)
Glucose-Capillary: 156 mg/dL — ABNORMAL HIGH (ref 70–99)
Glucose-Capillary: 160 mg/dL — ABNORMAL HIGH (ref 70–99)

## 2022-03-24 LAB — BASIC METABOLIC PANEL
Anion gap: 9 (ref 5–15)
BUN: 64 mg/dL — ABNORMAL HIGH (ref 6–20)
CO2: 23 mmol/L (ref 22–32)
Calcium: 8.2 mg/dL — ABNORMAL LOW (ref 8.9–10.3)
Chloride: 105 mmol/L (ref 98–111)
Creatinine, Ser: 3 mg/dL — ABNORMAL HIGH (ref 0.61–1.24)
GFR, Estimated: 23 mL/min — ABNORMAL LOW (ref >60)
Glucose, Bld: 101 mg/dL — ABNORMAL HIGH (ref 70–99)
Potassium: 3.3 mmol/L — ABNORMAL LOW (ref 3.5–5.1)
Sodium: 137 mmol/L (ref 135–145)

## 2022-03-24 MED ORDER — FUROSEMIDE 10 MG/ML IJ SOLN
80.0000 mg | Freq: Three times a day (TID) | INTRAMUSCULAR | Status: DC
  Administered 2022-03-24 – 2022-03-25 (×3): 80 mg via INTRAVENOUS
  Filled 2022-03-24 (×3): qty 8

## 2022-03-24 MED ORDER — HYDRALAZINE HCL 50 MG PO TABS
50.0000 mg | ORAL_TABLET | Freq: Three times a day (TID) | ORAL | Status: DC
  Administered 2022-03-24 – 2022-04-02 (×27): 50 mg via ORAL
  Filled 2022-03-24 (×28): qty 1

## 2022-03-24 NOTE — Progress Notes (Signed)
[facility] PROGRESS NOTE    Progress Note  [name]  ZOX:[id number] DOB: Jan 09, 1961 DOA: 03/17/2022 PCP: Jodi Marble, MD     Brief Narrative:   [name] is an 62 y.o. male past medical history of chronic diastolic heart failure, chronic kidney disease stage IV, diabetes mellitus type 2, peripheral vascular disease with a BKA brought in for shortness of breath, CT of the chest showed groundglass opacities  Assessment/Plan:   Acute respiratory failure with hypoxia secondary to pulmonary edema/chronic diastolic heart failure: In the setting of that chronic diastolic heart failure. Continues to be negative about 8 L, diuretic management per nephrology. Blood pressure is stabilized. Creatinine has remained around 3.  Chronic kidney disease stage IV: Cont bicarbonate tablets.  Essential hypertension: Currently on Norvasc, Coreg IV Lasix. Increase hydralazine continue titrate as antihypertensive medications as needed.  Chest pain: Unlikely cardiac improved with GI cocktail.  Peripheral vascular disease/diabetes mellitus type 2: With an A1c of 6.1. Continue Lyrica. Continue long-acting insulin per sliding scale, blood glucose fairly controlled.  Normocytic anemia: Continue to monitor.  Leukopenia: Borderline we will need to follow-up as an outpatient with PCP.  Significant results are present on admission unstageable RN Pressure Injury Documentation: Pressure Injury 03/02/21 Coccyx (Active)  03/02/21 1740  Location: Coccyx  Location Orientation:   Staging:   Wound Description (Comments):   Present on Admission:      Pressure Injury 12/08/21 Other (Comment) Left;Posterior Unstageable - Full thickness tissue loss in which the base of the injury is covered by slough (yellow, tan, gray, green or brown) and/or eschar (tan, brown or black) in the wound bed. (Active)  12/08/21 2300  Location: Other (Comment)  Location Orientation: Left;Posterior   Staging: Unstageable - Full thickness tissue loss in which the base of the injury is covered by slough (yellow, tan, gray, green or brown) and/or eschar (tan, brown or black) in the wound bed.  Wound Description (Comments):   Present on Admission: Yes      DVT prophylaxis: lovenox Family Communication:none Status is: Inpatient Remains inpatient appropriate because: Remains inpatient for heart failure    Code Status:     Code Status Orders  (From admission, onward)           Start     Ordered   03/17/22 2133  Full code  Continuous       Question:  By:  Answer:  Consent: discussion documented in EHR   03/17/22 2134           Code Status History     Date Active Date Inactive Code Status Order ID Comments User Context   03/17/2022 2101 03/17/2022 2134 Full Code [id number]  Kristopher Oppenheim, DO ED   12/08/2021 2207 12/11/2021 1757 Full Code [id number]  Lenore Cordia, MD ED   10/19/2021 1602 11/01/2021 2202 Full Code [id number]  Eppie Gibson, MD ED   03/01/2021 1507 03/13/2021 0408 Full Code [id number]  Karmen Bongo, MD Inpatient   11/18/2020 2041 12/12/2020 0309 Full Code [id number]  Orene Desanctis, DO ED   08/22/2020 0531 09/22/2020 1954 Full Code [id number]  Etta Quill, DO ED   06/30/2020 1243 07/05/2020 2345 Full Code [id number]  Geradine Girt, DO ED   06/25/2020 [place] 06/28/2020 2024 Full Code [id number]  Norval Morton, MD ED   01/17/2020 1708 01/31/2020 1753 DNR [id number]  Rosezella Rumpf, NP Inpatient   12/31/2019 2311 01/17/2020 1708 Full Code [id number]  Opyd, Ilene Qua, MD ED  12/31/2017 1739 01/02/2018 1315 Full Code [id number]  Albertine Patricia, MD Inpatient   09/28/2017 1434 10/01/2017 1948 Full Code [id number]  MoltRomelle Starcher, DO ED   07/30/2017 2111 08/04/2017 1746 Full Code [id number]  [name], NP Inpatient   07/29/2017 2347 07/30/2017 2033 Full Code [id number]  Recardo Evangelist, PA-C ED   01/08/2015 2013 01/09/2015 2017 Full Code [id number]  [name], MD  Inpatient   01/04/2015 1915 01/08/2015 2013 Full Code [id number]  [name], MD Inpatient   12/01/2014 1735 12/02/2014 1242 Full Code [id number]  [name], MD ED   08/28/2013 1748 08/31/2013 1627 Full Code [id number]  [name], NP Inpatient   08/28/2013 1301 08/28/2013 1748 Full Code [id number]  [name], MD ED   05/10/2011 2141 05/12/2011 1747 Full Code [id number]  [name], PA-C ED         IV Access:   Peripheral IV   Procedures and diagnostic studies:   DG CHEST PORT 1 VIEW  Result Date: 03/23/2022 CLINICAL DATA:  Follow-up exam.  Anasarca.  Chronic renal disease. EXAM: PORTABLE CHEST 1 VIEW COMPARISON:  March 17, 2022 FINDINGS: Improving bilateral pulmonary infiltrates. Cardiomegaly. The hila and mediastinum are unchanged. No pneumothorax. No other acute abnormalities. IMPRESSION: Improving bilateral pulmonary opacities thought to represent pulmonary edema on the March 17, 2022 CT scan of the chest. Electronically Signed   By: Dorise Bullion III M.D.   On: 03/23/2022 13:18     Medical Consultants:   None.   Subjective:    [name] 89 this morning. Objective:    Vitals:   03/23/22 1720 03/23/22 1816 03/23/22 2128 03/24/22 0440  BP: (!) 177/101 (!) 140/82  (!) 157/96  Pulse:   73 68  Resp:   20 20  Temp:   98.3 F (36.8 C) 97.9 F (36.6 C)  TempSrc:   Oral Oral  SpO2:   96% 100%  Weight:    112 kg  Height:       SpO2: 100 % O2 Flow Rate (L/min): 2 L/min   Intake/Output Summary (Last 24 hours) at 03/24/2022 0826 Last data filed at 03/24/2022 0740 Gross per 24 hour  Intake 1049.25 ml  Output 1750 ml  Net -700.75 ml    Filed Weights   03/22/22 0500 03/23/22 0639 03/24/22 0440  Weight: 115.2 kg 113.9 kg 112 kg    Exam: General exam: In no acute distress. Respiratory system: Good air movement and clear to auscultation. Cardiovascular system: S1 & S2 heard, RRR. No JVD. Gastrointestinal system: Abdomen is nondistended, soft and  nontender.  Extremities: 3+ edema. Skin: No rashes, lesions or ulcers Psychiatry: Judgement and insight appear normal. Mood & affect appropriate.  Data Reviewed:    Labs: Basic Metabolic Panel: Recent Labs  Lab 03/20/22 1116 03/21/22 0751 03/22/22 0956 03/23/22 0755 03/24/22 0534  NA 138 139 138 137 137  K 4.1 3.7 3.5 3.5 3.3*  CL 109 110 106 106 105  CO2 22 21* '23 23 23  '$ GLUCOSE 100* 136* 134* 117* 101*  BUN 56* 58* 64* 64* 64*  CREATININE 3.01* 2.87* 3.05* 3.13* 3.00*  CALCIUM 7.9* 8.1* 8.2* 8.0* 8.2*    GFR Estimated Creatinine Clearance: 31.8 mL/min (A) (by C-G formula based on SCr of 3 mg/dL (H)). Liver Function Tests: Recent Labs  Lab 03/17/22 1440 03/18/22 0404  AST 16 10*  ALT 18 15  ALKPHOS 115 91  BILITOT 0.5 0.4  PROT 7.0 6.4*  ALBUMIN 2.7* 2.5*  No results for input(s): "LIPASE", "AMYLASE" in the last 168 hours. No results for input(s): "AMMONIA" in the last 168 hours. Coagulation profile No results for input(s): "INR", "PROTIME" in the last 168 hours. COVID-19 Labs  No results for input(s): "DDIMER", "FERRITIN", "LDH", "CRP" in the last 72 hours.   Lab Results  Component Value Date   SARSCOV2NAA NEGATIVE 03/17/2022   [place] NEGATIVE 12/08/2021   [place] NEGATIVE 10/19/2021   [place] NEGATIVE 03/12/2021    CBC: Recent Labs  Lab 03/17/22 1440 03/18/22 0404 03/19/22 0754 03/20/22 1116 03/21/22 0751  WBC 6.3 5.2 4.1 4.0 3.3*  NEUTROABS 4.9 3.3  --   --   --   HGB 8.1* 7.4* 7.5* 7.9* 7.6*  HCT 26.6* 24.8* 24.9* 25.4* 24.7*  MCV 83.9 84.1 83.8 82.7 82.9  PLT 174 162 162 167 175    Cardiac Enzymes: No results for input(s): "CKTOTAL", "CKMB", "CKMBINDEX", "TROPONINI" in the last 168 hours. BNP (last 3 results) No results for input(s): "PROBNP" in the last 8760 hours. CBG: Recent Labs  Lab 03/23/22 0844 03/23/22 1158 03/23/22 1624 03/23/22 2025 03/24/22 0739  GLUCAP 115* 130* 127* 157* 96    D-Dimer: No  results for input(s): "DDIMER" in the last 72 hours. Hgb A1c: No results for input(s): "HGBA1C" in the last 72 hours.  Lipid Profile: No results for input(s): "CHOL", "HDL", "LDLCALC", "TRIG", "CHOLHDL", "LDLDIRECT" in the last 72 hours. Thyroid function studies: No results for input(s): "TSH", "T4TOTAL", "T3FREE", "THYROIDAB" in the last 72 hours.  Invalid input(s): "FREET3" Anemia work up: No results for input(s): "VITAMINB12", "FOLATE", "FERRITIN", "TIBC", "IRON", "RETICCTPCT" in the last 72 hours.  Sepsis Labs: Recent Labs  Lab 03/18/22 0404 03/18/22 1700 03/19/22 0754 03/20/22 1116 03/21/22 0751  PROCALCITON <0.10  --   --   --   --   WBC 5.2  --  4.1 4.0 3.3*  LATICACIDVEN  --  0.7  --   --   --     Microbiology Recent Results (from the past 240 hour(s))  Resp panel by RT-PCR (RSV, Flu A&B, Covid) Anterior Nasal Swab     Status: None   Collection Time: 03/17/22  3:00 PM   Specimen: Anterior Nasal Swab  Result Value Ref Range Status   SARS Coronavirus 2 by RT PCR NEGATIVE NEGATIVE Final    Comment: (NOTE) SARS-CoV-2 target nucleic acids are NOT DETECTED.  The SARS-CoV-2 RNA is generally detectable in upper respiratory specimens during the acute phase of infection. The lowest concentration of SARS-CoV-2 viral copies this assay can detect is 138 copies/mL. A negative result does not preclude SARS-Cov-2 infection and should not be used as the sole basis for treatment or other patient management decisions. A negative result may occur with  improper specimen collection/handling, submission of specimen other than nasopharyngeal swab, presence of viral mutation(s) within the areas targeted by this assay, and inadequate number of viral copies(<138 copies/mL). A negative result must be combined with clinical observations, patient history, and epidemiological information. The expected result is Negative.  Fact Sheet for Patients:   EntrepreneurPulse.com.au  Fact Sheet for Healthcare Providers:  IncredibleEmployment.be  This test is no t yet approved or cleared by the [place] FDA and  has been authorized for detection and/or diagnosis of SARS-CoV-2 by FDA under an Emergency Use Authorization (EUA). This EUA will remain  in effect (meaning this test can be used) for the duration of the COVID-19 declaration under Section 564(b)(1) of the Act, 21 U.S.C.section 360bbb-3(b)(1), unless the authorization  is terminated  or revoked sooner.       Influenza A by PCR NEGATIVE NEGATIVE Final   Influenza B by PCR NEGATIVE NEGATIVE Final    Comment: (NOTE) The Xpert Xpress SARS-CoV-2/FLU/RSV plus assay is intended as an aid in the diagnosis of influenza from Nasopharyngeal swab specimens and should not be used as a sole basis for treatment. Nasal washings and aspirates are unacceptable for Xpert Xpress SARS-CoV-2/FLU/RSV testing.  Fact Sheet for Patients: EntrepreneurPulse.com.au  Fact Sheet for Healthcare Providers: IncredibleEmployment.be  This test is not yet approved or cleared by the [place] FDA and has been authorized for detection and/or diagnosis of SARS-CoV-2 by FDA under an Emergency Use Authorization (EUA). This EUA will remain in effect (meaning this test can be used) for the duration of the COVID-19 declaration under Section 564(b)(1) of the Act, 21 U.S.C. section 360bbb-3(b)(1), unless the authorization is terminated or revoked.     Resp Syncytial Virus by PCR NEGATIVE NEGATIVE Final    Comment: (NOTE) Fact Sheet for Patients: EntrepreneurPulse.com.au  Fact Sheet for Healthcare Providers: IncredibleEmployment.be  This test is not yet approved or cleared by the [place] FDA and has been authorized for detection and/or diagnosis of SARS-CoV-2 by FDA under an Emergency Use  Authorization (EUA). This EUA will remain in effect (meaning this test can be used) for the duration of the COVID-19 declaration under Section 564(b)(1) of the Act, 21 U.S.C. section 360bbb-3(b)(1), unless the authorization is terminated or revoked.  Performed at [facility], [place] [address], [place], [place] [address]   Blood culture (routine x 2)     Status: None   Collection Time: 03/17/22 10:14 PM   Specimen: BLOOD  Result Value Ref Range Status   Specimen Description   Final    BLOOD BLOOD LEFT HAND Performed at [facility] [address], [place], [place] [address]    Special Requests   Final    BOTTLES DRAWN AEROBIC AND ANAEROBIC Blood Culture results may not be optimal due to an excessive volume of blood received in culture bottles Performed at [facility] [address] [place], [place] [address]    Culture   Final    NO GROWTH 5 DAYS Performed at [facility], [place] [address], [place], [place] [address]    Report Status 03/23/2022 FINAL  Final  Blood culture (routine x 2)     Status: None   Collection Time: 03/17/22 10:18 PM   Specimen: BLOOD  Result Value Ref Range Status   Specimen Description   Final    BLOOD BLOOD LEFT FOREARM Performed at [facility] [address] [place], [place] [address]    Special Requests   Final    BOTTLES DRAWN AEROBIC AND ANAEROBIC Blood Culture results may not be optimal due to an excessive volume of blood received in culture bottles Performed at [facility] [address], [place], [place] [address]    Culture   Final    NO GROWTH 5 DAYS Performed at [facility], [place] [address], [place], [place] [address]    Report Status 03/23/2022 FINAL  Final     Medications:    amLODipine  10 mg Oral Daily   aspirin  81 mg Oral Daily   brimonidine  1 drop Both Eyes BID   carvedilol  12.5 mg Oral BID WC   dorzolamide  1  drop Both Eyes BID   furosemide  80 mg Intravenous Q8H   heparin  5,000 Units Subcutaneous Q8H   hydrALAZINE  25 mg Oral Q8H   insulin aspart  0-5 Units Subcutaneous QHS   insulin aspart  0-9 Units Subcutaneous TID WC   insulin aspart  2 Units Subcutaneous TID WC   insulin glargine-yfgn  10 Units Subcutaneous QHS   latanoprost  1 drop Both Eyes QHS   pantoprazole  40 mg Oral Daily   pregabalin  75 mg Oral Daily   sertraline  50 mg Oral QODAY   sodium bicarbonate  1,300 mg Oral BID   tamsulosin  0.8 mg Oral QPM   Continuous Infusions:      LOS: 6 days   Charlynne Cousins  [facility]  03/24/2022, 8:26 AM

## 2022-03-24 NOTE — Progress Notes (Signed)
[facility] Progress Note  Subjective: seen in room, creat stable 3.0, UOP 2400 yest.   Vitals:   03/23/22 2128 03/24/22 0440 03/24/22 0913 03/24/22 1222  BP:  (!) 157/96 (!) 142/82 137/75  Pulse: 73 68 74 72  Resp: '20 20 20 18  '$ Temp: 98.3 F (36.8 C) 97.9 F (36.6 C) 98.3 F (36.8 C) 97.8 F (36.6 C)  TempSrc: Oral Oral Oral Oral  SpO2: 96% 100% 99% 100%  Weight:  112 kg    Height:        Exam: Gen alert, no distress, bedbound No jvd or bruits Chest mostly clear RRR no MRG Abd soft ntnd no mass or ascites +bs Ext diffuse 2-3+ pitting hip/ abd wall/ stump edema Neuro is alert, Ox 3 , R hemiparesis chronic       Home meds include - albuterol, norvasc 10, aspirin, lipitor, catapres patch 0.1, colace, lasix 80 bid, hydralazine 25 tid, insulin aspart/ glargine, nepro, protonix, klorcon 20, lyrica, zoloft, sod bicarb      Date                          Creat               eGFR    2021                         1.10- 2.31    2022                         1.25- 2.07 Mar 2021                  2.90 >> 1.21    Aug- sept 2023        1.97- 3.79    Oct 2023                  2.53- 2.61        27- 29 ml/min      1/21/ 24                    3.22     03/18/22                    3.17     UA > 300 prot, 0-5 rbc/ wbc   UNa 99, UCr 51    CXR - pulm edema, CM    Renal US - 11.5/ 11.3 kidneys w/o hydro, +^echo bilat     I/O net = 1.6 L in and 6.0 L out = - 4.4 L    Wts 116 --> 116, no change     Assessment/ Plan: AKI on CKD 4 - b/l creat 2.5- 2.6 in Oct 2023, eGFR 27-29 ml/min. Creat here 3.1 on admission in setting of decomp CHF w/ anasarca and pulm edema. AKI due to decomp CHF and/or progression of CKD.  Has been getting IV lasix. Creat went down then up a bit. Still has marked amount of anasarca. Creat stable 3.0 today, will ^ lasix slightly to '80mg'$  tid IV, hoping for closer to 3 L UOP per day. Will ^fluid restriction to 1000 cc. F/U CXR yest showed mostly resolved CHF  pattern. Cont diuresis.  HTN - bp's normal or slightly high, on 3 bp lowering meds   PAD/  bilat LE amputee - gets around in a wheelchair, lives  in SNF Vol overload/ decomp diast CHF - as above Anemia - due in part to CKD most likely. Tsat 12%, giving additional IV iron '250mg'$  x 2 while here AHRF - 3 L [place] on admission now on RA DM2 H/o CVA - w/ R hemiparesis     Rob Marabelle Cushman 03/24/2022, 12:51 PM   Recent Labs  Lab 03/17/22 1440 03/18/22 0404 03/19/22 0754 03/20/22 1116 03/21/22 0751 03/22/22 0956 03/23/22 0755 03/24/22 0534  HGB 8.1* 7.4*   < > 7.9* 7.6*  --   --   --   ALBUMIN 2.7* 2.5*  --   --   --   --   --   --   CALCIUM 7.8* 8.0*   < > 7.9* 8.1*   < > 8.0* 8.2*  CREATININE 3.22* 3.17*   < > 3.01* 2.87*   < > 3.13* 3.00*  K 4.3 4.1   < > 4.1 3.7   < > 3.5 3.3*   < > = values in this interval not displayed.    Recent Labs  Lab 03/19/22 0754  IRON 26*  TIBC 220*  FERRITIN 235    Inpatient medications:  amLODipine  10 mg Oral Daily   aspirin  81 mg Oral Daily   brimonidine  1 drop Both Eyes BID   carvedilol  12.5 mg Oral BID WC   dorzolamide  1 drop Both Eyes BID   furosemide  80 mg Intravenous Q8H   heparin  5,000 Units Subcutaneous Q8H   hydrALAZINE  50 mg Oral Q8H   insulin aspart  0-5 Units Subcutaneous QHS   insulin aspart  0-9 Units Subcutaneous TID WC   insulin aspart  2 Units Subcutaneous TID WC   insulin glargine-yfgn  10 Units Subcutaneous QHS   latanoprost  1 drop Both Eyes QHS   pantoprazole  40 mg Oral Daily   pregabalin  75 mg Oral Daily   sertraline  50 mg Oral QODAY   sodium bicarbonate  1,300 mg Oral BID   tamsulosin  0.8 mg Oral QPM     acetaminophen **OR** acetaminophen, hydrALAZINE, metoprolol tartrate, nitroGLYCERIN, ondansetron **OR** ondansetron (ZOFRAN) IV

## 2022-03-25 DIAGNOSIS — N184 Chronic kidney disease, stage 4 (severe): Secondary | ICD-10-CM | POA: Diagnosis not present

## 2022-03-25 DIAGNOSIS — I5033 Acute on chronic diastolic (congestive) heart failure: Secondary | ICD-10-CM | POA: Diagnosis not present

## 2022-03-25 DIAGNOSIS — R0602 Shortness of breath: Secondary | ICD-10-CM | POA: Diagnosis not present

## 2022-03-25 DIAGNOSIS — J189 Pneumonia, unspecified organism: Secondary | ICD-10-CM | POA: Diagnosis not present

## 2022-03-25 LAB — GLUCOSE, CAPILLARY
Glucose-Capillary: 111 mg/dL — ABNORMAL HIGH (ref 70–99)
Glucose-Capillary: 123 mg/dL — ABNORMAL HIGH (ref 70–99)
Glucose-Capillary: 141 mg/dL — ABNORMAL HIGH (ref 70–99)
Glucose-Capillary: 176 mg/dL — ABNORMAL HIGH (ref 70–99)

## 2022-03-25 LAB — BASIC METABOLIC PANEL
Anion gap: 7 (ref 5–15)
BUN: 76 mg/dL — ABNORMAL HIGH (ref 6–20)
CO2: 22 mmol/L (ref 22–32)
Calcium: 7.9 mg/dL — ABNORMAL LOW (ref 8.9–10.3)
Chloride: 105 mmol/L (ref 98–111)
Creatinine, Ser: 2.91 mg/dL — ABNORMAL HIGH (ref 0.61–1.24)
GFR, Estimated: 24 mL/min — ABNORMAL LOW (ref >60)
Glucose, Bld: 131 mg/dL — ABNORMAL HIGH (ref 70–99)
Potassium: 3.4 mmol/L — ABNORMAL LOW (ref 3.5–5.1)
Sodium: 134 mmol/L — ABNORMAL LOW (ref 135–145)

## 2022-03-25 LAB — ALBUMIN: Albumin: 2.4 g/dL — ABNORMAL LOW (ref 3.5–5.0)

## 2022-03-25 MED ORDER — FUROSEMIDE 10 MG/ML IJ SOLN
80.0000 mg | Freq: Two times a day (BID) | INTRAMUSCULAR | Status: DC
  Administered 2022-03-25: 80 mg via INTRAVENOUS
  Filled 2022-03-25: qty 8

## 2022-03-25 MED ORDER — POTASSIUM CHLORIDE CRYS ER 20 MEQ PO TBCR
20.0000 meq | EXTENDED_RELEASE_TABLET | Freq: Two times a day (BID) | ORAL | Status: AC
  Administered 2022-03-25 (×2): 20 meq via ORAL
  Filled 2022-03-25 (×2): qty 1

## 2022-03-25 MED ORDER — ALBUMIN HUMAN 25 % IV SOLN
25.0000 g | Freq: Four times a day (QID) | INTRAVENOUS | Status: AC
  Administered 2022-03-25 – 2022-03-26 (×4): 25 g via INTRAVENOUS
  Filled 2022-03-25 (×4): qty 100

## 2022-03-25 NOTE — TOC Progression Note (Addendum)
Transition of Care [facility]) - Progression Note    Patient Details  Name: [name] MRN: [id number] Date of Birth: 03/03/1960  [facility] CM/SW Contact  Oliana Gowens, Juliann Pulse, RN Phone Number: 03/25/2022, 11:03 AM  Clinical Narrative:spoke to patient about d/c plans-plans to return to [facility] had no further CM questions or concerns.Taylor aware of d/c in am or Wednesday.       Expected Discharge Plan: Long Term Nursing Home Barriers to Discharge: Continued Medical Work up  Expected Discharge Plan and Services     Post Acute Care Choice: [place] Living arrangements for the past 2 months: [place]                                       Social Determinants of Health (SDOH) Interventions SDOH Screenings   Food Insecurity: No Food Insecurity (03/18/2022)  Housing: Low Risk  (03/18/2022)  Transportation Needs: No Transportation Needs (03/18/2022)  Utilities: Not At Risk (03/18/2022)  Alcohol Screen: Low Risk  (07/30/2017)  Depression (PHQ2-9): Low Risk  (07/10/2020)  Tobacco Use: Medium Risk (03/23/2022)    Readmission Risk Interventions    03/20/2022   12:20 PM 12/10/2021    8:41 AM 01/12/2020    3:39 PM  Readmission Risk Prevention Plan  Transportation Screening Complete Complete Complete  Palliative Care Screening   Not Applicable  Medication Review (RN Care Manager) Complete Complete   PCP or Specialist appointment within 3-5 days of discharge Complete Complete   HRI or Home Care Consult Complete Complete   SW Recovery Care/Counseling Consult Complete Complete   Palliative Care Screening Not Applicable Not Applicable   Skilled Nursing Facility Complete Complete

## 2022-03-25 NOTE — Progress Notes (Signed)
[facility] NEPHROLOGY PROGRESS NOTE  Assessment/ Plan: Pt is a 62 y.o. yo male with history of CHF, DM, peripheral vascular disease status post left BKA, stroke, HTN, BPH, CKD stage IV, presented with shortness of breath and hypoxia seen as a consultation for AKI on CKD.  # Acute kidney injury on CKD stage IV with baseline creatinine level 2.5-2.6.  AKI thought to be due to decompensated CHF with anasarca/pulm edema.  He is receiving IV Lasix with a stable creatinine level however noted BUN started going up.  He is on room air however still has some dyspnea on exertion therefore I will continue diuretics..  I will change Lasix to twice a day and add IV albumin.  Strict ins and out and daily lab.  No need for dialysis.  # Hypokalemia: Replete potassium chloride.  Monitor lab.  # Hypertension: Blood pressure acceptable.  Continue current antihypertensive medication diuretics as above.  # Acute respiratory failure with hypoxia due to acute pulmonary edema/CHF.  Management as above.  Currently on room air.  # Metabolic acidosis: On sodium bicarbonate.  Monitor serum CO2 level.  # Anemia partly due to CKD.  Received IV iron twice.  I will order a dose of Aranesp.  Subjective: I have seen and examined at bedside.  Patient stated he still has some shortness of breath especially with exertion.  Denies nausea, vomiting, chest pain. Objective Vital signs in last 24 hours: Vitals:   03/24/22 0913 03/24/22 1222 03/24/22 2107 03/25/22 0448  BP: (!) 142/82 137/75 (!) 140/80 (!) 140/75  Pulse: 74 72 72 69  Resp: '20 18 19 19  '$ Temp: 98.3 F (36.8 C) 97.8 F (36.6 C) 97.9 F (36.6 C) 98.5 F (36.9 C)  TempSrc: Oral Oral Oral Oral  SpO2: 99% 100% 98% 98%  Weight:    116 kg  Height:       Weight change: 4 kg  Intake/Output Summary (Last 24 hours) at 03/25/2022 1207 Last data filed at 03/25/2022 0100 Gross per 24 hour  Intake 680 ml  Output 400 ml  Net 280 ml        Labs: RENAL PANEL Recent Labs    10/20/21 1259 10/21/21 0234 10/21/21 1501 10/23/21 0947 10/24/21 1051 10/25/21 0451 10/26/21 0905 10/27/21 0819 10/28/21 0659 10/29/21 0251 10/30/21 0220 10/31/21 1249 11/01/21 0952 12/08/21 1919 12/09/21 0526 12/10/21 1004 03/17/22 1440 03/18/22 0404 03/19/22 0754 03/20/22 1116 03/21/22 0751 03/22/22 0956 03/23/22 0755 03/24/22 0534 03/25/22 0935  NA 140 139   < > '138 138 139 138 139 138 139 139 138 139 '$ 139   < > 137 134* 139 139 138 139 138 137 137 134*  K 5.0 4.6   < > 3.8 4.1 3.8 3.5 3.5 3.2* 3.0* 3.3* 3.3* 3.1* 5.1   < > 3.7 4.3 4.1 4.1 4.1 3.7 3.5 3.5 3.3* 3.4*  CL 117* 116*   < > 113* 113* 113* 112* 112* 111 109 105 106 106 115*   < > 111 112* 113* 111 109 110 106 106 105 105  CO2 16* 16*   < > 16* 17* 18* 18* 21* 19* 20* 22 21* 23 20*   < > 19* 17* 20* 21* 22 21* '23 23 23 22  '$ GLUCOSE 151* 124*   < > 144* 149* 138* 136* 130* 146* 130* 185* 167* 113* 126*   < > 136* 171* 104* 113* 100* 136* 134* 117* 101* 131*  BUN 43* 45*   < > 48* 47* 47*  48* 49* 50* 48* 49* 53* 55* 39*   < > 44* 57* 53* 60* 56* 58* 64* 64* 64* 76*  CREATININE 2.20* 2.19*   < > 3.37* 3.58* 3.74* 3.79* 3.69* 3.45* 3.33* 3.14* 3.02* 2.89* 2.53*   < > 2.61* 3.22* 3.17* 3.32* 3.01* 2.87* 3.05* 3.13* 3.00* 2.91*  CALCIUM 8.3* 7.9*   < > 7.9* 7.7* 7.7* 7.8* 7.9* 7.8* 8.1* 8.2* 8.2* 8.4* 8.4*   < > 8.0* 7.8* 8.0* 8.0* 7.9* 8.1* 8.2* 8.0* 8.2* 7.9*  MG 1.9 1.9  --   --   --  1.9  --   --   --  1.7  --  1.6* 1.6*  --   --   --   --   --   --   --   --   --   --   --   --   PHOS  --   --   --  5.9* 6.1* 6.0* 5.9* 5.8* 5.6* 5.5* 4.7* 4.5  --   --   --   --   --   --   --   --   --   --   --   --   --   ALBUMIN  --   --   --  2.3* 2.0* 2.0* 2.1* 2.0* 1.9* 2.0* 2.0* 2.0*  --  2.6*  --   --  2.7* 2.5*  --   --   --   --   --   --   --    < > = values in this interval not displayed.     Liver Function Tests: No results for input(s): "AST", "ALT", "ALKPHOS", "BILITOT",  "PROT", "ALBUMIN" in the last 168 hours. No results for input(s): "LIPASE", "AMYLASE" in the last 168 hours. No results for input(s): "AMMONIA" in the last 168 hours. CBC: Recent Labs    10/20/21 0449 10/21/21 0234 03/17/22 1440 03/18/22 0404 03/19/22 0754 03/20/22 1116 03/21/22 0751  HGB  --    < > 8.1* 7.4* 7.5* 7.9* 7.6*  MCV  --    < > 83.9 84.1 83.8 82.7 82.9  VITAMINB12 439  --   --   --  321  --   --   FOLATE 6.9  --   --   --  9.3  --   --   FERRITIN 164  --   --   --  235  --   --   TIBC 227*  --   --   --  220*  --   --   IRON 12*  --   --   --  26*  --   --   RETICCTPCT 2.6  --   --   --  1.5  --   --    < > = values in this interval not displayed.    Cardiac Enzymes: No results for input(s): "CKTOTAL", "CKMB", "CKMBINDEX", "TROPONINI" in the last 168 hours. CBG: Recent Labs  Lab 03/24/22 1117 03/24/22 1642 03/24/22 2107 03/25/22 0734 03/25/22 1159  GLUCAP 123* 156* 160* 111* 123*    Iron Studies: No results for input(s): "IRON", "TIBC", "TRANSFERRIN", "FERRITIN" in the last 72 hours. Studies/Results: DG CHEST PORT 1 VIEW  Result Date: 03/23/2022 CLINICAL DATA:  Follow-up exam.  Anasarca.  Chronic renal disease. EXAM: PORTABLE CHEST 1 VIEW COMPARISON:  March 17, 2022 FINDINGS: Improving bilateral pulmonary infiltrates. Cardiomegaly. The hila and mediastinum are unchanged. No pneumothorax. No other acute abnormalities.  IMPRESSION: Improving bilateral pulmonary opacities thought to represent pulmonary edema on the March 17, 2022 CT scan of the chest. Electronically Signed   By: Dorise Bullion III M.D.   On: 03/23/2022 13:18    Medications: Infusions:   Scheduled Medications:  amLODipine  10 mg Oral Daily   aspirin  81 mg Oral Daily   brimonidine  1 drop Both Eyes BID   carvedilol  12.5 mg Oral BID WC   dorzolamide  1 drop Both Eyes BID   furosemide  80 mg Intravenous Q8H   heparin  5,000 Units Subcutaneous Q8H   hydrALAZINE  50 mg Oral Q8H    insulin aspart  0-5 Units Subcutaneous QHS   insulin aspart  0-9 Units Subcutaneous TID WC   insulin aspart  2 Units Subcutaneous TID WC   insulin glargine-yfgn  10 Units Subcutaneous QHS   latanoprost  1 drop Both Eyes QHS   pantoprazole  40 mg Oral Daily   potassium chloride  20 mEq Oral BID   pregabalin  75 mg Oral Daily   sertraline  50 mg Oral QODAY   sodium bicarbonate  1,300 mg Oral BID   tamsulosin  0.8 mg Oral QPM    have reviewed scheduled and prn medications.  Physical Exam: General:NAD, comfortable Heart:RRR, s1s2 nl Lungs:clear b/l, no crackle Abdomen:soft, Non-tender, non-distended Extremities: Lower extremity pitting edema, chronic skin changes, stump edema. Neurology: Alert awake and following commands.  Arianah Torgeson Tanna Furry 03/25/2022,12:07 PM  LOS: 7 days

## 2022-03-25 NOTE — Progress Notes (Signed)
[facility] PROGRESS NOTE    Progress Note  [name]  QPY:[id number] DOB: Nov 29, 1960 DOA: 03/17/2022 PCP: Jodi Marble, MD     Brief Narrative:   [name] is an 62 y.o. male past medical history of chronic diastolic heart failure, chronic kidney disease stage IV, diabetes mellitus type 2, peripheral vascular disease with a BKA brought in for shortness of breath, CT of the chest showed groundglass opacities  Assessment/Plan:   Acute respiratory failure with hypoxia secondary to pulmonary edema/chronic diastolic heart failure: In the setting of that chronic diastolic heart failure. Continues to be negative about 9-1/2 L, diuretic management per nephrology. Blood pressure is stabilized. Creatinine is pending this morning.  Chronic kidney disease stage IV: Cont bicarbonate tablets.  Essential hypertension: Currently on Norvasc, Coreg IV Lasix. Increase hydralazine continue titrate as antihypertensive medications as needed.  Chest pain: Unlikely cardiac improved with GI cocktail.  Peripheral vascular disease/diabetes mellitus type 2: With an A1c of 6.1. Continue Lyrica. Continue long-acting insulin per sliding scale, blood glucose fairly controlled.  Normocytic anemia: Continue to monitor.  Leukopenia: Borderline we will need to follow-up as an outpatient with PCP.  Significant results are present on admission unstageable RN Pressure Injury Documentation: Pressure Injury 03/02/21 Coccyx (Active)  03/02/21 1740  Location: Coccyx  Location Orientation:   Staging:   Wound Description (Comments):   Present on Admission:      Pressure Injury 12/08/21 Other (Comment) Left;Posterior Unstageable - Full thickness tissue loss in which the base of the injury is covered by slough (yellow, tan, gray, green or brown) and/or eschar (tan, brown or black) in the wound bed. (Active)  12/08/21 2300  Location: Other (Comment)  Location Orientation: Left;Posterior   Staging: Unstageable - Full thickness tissue loss in which the base of the injury is covered by slough (yellow, tan, gray, green or brown) and/or eschar (tan, brown or black) in the wound bed.  Wound Description (Comments):   Present on Admission: Yes      DVT prophylaxis: lovenox Family Communication:none Status is: Inpatient Remains inpatient appropriate because: Remains inpatient for heart failure    Code Status:     Code Status Orders  (From admission, onward)           Start     Ordered   03/17/22 2133  Full code  Continuous       Question:  By:  Answer:  Consent: discussion documented in EHR   03/17/22 2134           Code Status History     Date Active Date Inactive Code Status Order ID Comments User Context   03/17/2022 2101 03/17/2022 2134 Full Code [id number]  Kristopher Oppenheim, DO ED   12/08/2021 2207 12/11/2021 1757 Full Code [id number]  Lenore Cordia, MD ED   10/19/2021 1602 11/01/2021 2202 Full Code [id number]  Eppie Gibson, MD ED   03/01/2021 1507 03/13/2021 0408 Full Code [id number]  Karmen Bongo, MD Inpatient   11/18/2020 2041 12/12/2020 0309 Full Code [id number]  Orene Desanctis, DO ED   08/22/2020 0531 09/22/2020 1954 Full Code [id number]  Etta Quill, DO ED   06/30/2020 1243 07/05/2020 2345 Full Code [id number]  Geradine Girt, DO ED   06/25/2020 [place] 06/28/2020 2024 Full Code [id number]  Norval Morton, MD ED   01/17/2020 1708 01/31/2020 1753 DNR [id number]  Rosezella Rumpf, NP Inpatient   12/31/2019 2311 01/17/2020 1708 Full Code [id number]  Opyd, Ilene Qua, MD ED  12/31/2017 1739 01/02/2018 1315 Full Code [id number]  Albertine Patricia, MD Inpatient   09/28/2017 1434 10/01/2017 1948 Full Code [id number]  MoltRomelle Starcher, DO ED   07/30/2017 2111 08/04/2017 1746 Full Code [id number]  [name], NP Inpatient   07/29/2017 2347 07/30/2017 2033 Full Code [id number]  Recardo Evangelist, PA-C ED   01/08/2015 2013 01/09/2015 2017 Full Code [id number]  [name], MD  Inpatient   01/04/2015 1915 01/08/2015 2013 Full Code [id number]  [name], MD Inpatient   12/01/2014 1735 12/02/2014 1242 Full Code [id number]  [name], MD ED   08/28/2013 1748 08/31/2013 1627 Full Code [id number]  [name], NP Inpatient   08/28/2013 1301 08/28/2013 1748 Full Code [id number]  [name], MD ED   05/10/2011 2141 05/12/2011 1747 Full Code [id number]  [name], PA-C ED         IV Access:   Peripheral IV   Procedures and diagnostic studies:   DG CHEST PORT 1 VIEW  Result Date: 03/23/2022 CLINICAL DATA:  Follow-up exam.  Anasarca.  Chronic renal disease. EXAM: PORTABLE CHEST 1 VIEW COMPARISON:  March 17, 2022 FINDINGS: Improving bilateral pulmonary infiltrates. Cardiomegaly. The hila and mediastinum are unchanged. No pneumothorax. No other acute abnormalities. IMPRESSION: Improving bilateral pulmonary opacities thought to represent pulmonary edema on the March 17, 2022 CT scan of the chest. Electronically Signed   By: Dorise Bullion III M.D.   On: 03/23/2022 13:18     Medical Consultants:   None.   Subjective:    [name],. Objective:    Vitals:   03/24/22 0913 03/24/22 1222 03/24/22 2107 03/25/22 0448  BP: (!) 142/82 137/75 (!) 140/80 (!) 140/75  Pulse: 74 72 72 69  Resp: '20 18 19 19  '$ Temp: 98.3 F (36.8 C) 97.8 F (36.6 C) 97.9 F (36.6 C) 98.5 F (36.9 C)  TempSrc: Oral Oral Oral Oral  SpO2: 99% 100% 98% 98%  Weight:    116 kg  Height:       SpO2: 98 % O2 Flow Rate (L/min): 2 L/min   Intake/Output Summary (Last 24 hours) at 03/25/2022 0848 Last data filed at 03/25/2022 0100 Gross per 24 hour  Intake 740 ml  Output 1200 ml  Net -460 ml    Filed Weights   03/23/22 0639 03/24/22 0440 03/25/22 0448  Weight: 113.9 kg 112 kg 116 kg    Exam: General exam: In no acute distress. Respiratory system: Good air movement and clear to auscultation. Cardiovascular system: S1 & S2 heard, RRR. No JVD. Gastrointestinal  system: Abdomen is nondistended, soft and nontender.  Extremities: 3+ edema. Skin: No rashes, lesions or ulcers Psychiatry: Judgement and insight appear normal. Mood & affect appropriate.  Data Reviewed:    Labs: Basic Metabolic Panel: Recent Labs  Lab 03/20/22 1116 03/21/22 0751 03/22/22 0956 03/23/22 0755 03/24/22 0534  NA 138 139 138 137 137  K 4.1 3.7 3.5 3.5 3.3*  CL 109 110 106 106 105  CO2 22 21* '23 23 23  '$ GLUCOSE 100* 136* 134* 117* 101*  BUN 56* 58* 64* 64* 64*  CREATININE 3.01* 2.87* 3.05* 3.13* 3.00*  CALCIUM 7.9* 8.1* 8.2* 8.0* 8.2*    GFR Estimated Creatinine Clearance: 32.4 mL/min (A) (by C-G formula based on SCr of 3 mg/dL (H)). Liver Function Tests: No results for input(s): "AST", "ALT", "ALKPHOS", "BILITOT", "PROT", "ALBUMIN" in the last 168 hours.  No results for input(s): "LIPASE", "AMYLASE" in the last 168 hours. No  results for input(s): "AMMONIA" in the last 168 hours. Coagulation profile No results for input(s): "INR", "PROTIME" in the last 168 hours. COVID-19 Labs  No results for input(s): "DDIMER", "FERRITIN", "LDH", "CRP" in the last 72 hours.   Lab Results  Component Value Date   SARSCOV2NAA NEGATIVE 03/17/2022   [place] NEGATIVE 12/08/2021   [place] NEGATIVE 10/19/2021   [place] NEGATIVE 03/12/2021    CBC: Recent Labs  Lab 03/19/22 0754 03/20/22 1116 03/21/22 0751  WBC 4.1 4.0 3.3*  HGB 7.5* 7.9* 7.6*  HCT 24.9* 25.4* 24.7*  MCV 83.8 82.7 82.9  PLT 162 167 175    Cardiac Enzymes: No results for input(s): "CKTOTAL", "CKMB", "CKMBINDEX", "TROPONINI" in the last 168 hours. BNP (last 3 results) No results for input(s): "PROBNP" in the last 8760 hours. CBG: Recent Labs  Lab 03/24/22 0739 03/24/22 1117 03/24/22 1642 03/24/22 2107 03/25/22 0734  GLUCAP 96 123* 156* 160* 111*    D-Dimer: No results for input(s): "DDIMER" in the last 72 hours. Hgb A1c: No results for input(s): "HGBA1C" in the last 72  hours.  Lipid Profile: No results for input(s): "CHOL", "HDL", "LDLCALC", "TRIG", "CHOLHDL", "LDLDIRECT" in the last 72 hours. Thyroid function studies: No results for input(s): "TSH", "T4TOTAL", "T3FREE", "THYROIDAB" in the last 72 hours.  Invalid input(s): "FREET3" Anemia work up: No results for input(s): "VITAMINB12", "FOLATE", "FERRITIN", "TIBC", "IRON", "RETICCTPCT" in the last 72 hours.  Sepsis Labs: Recent Labs  Lab 03/18/22 1700 03/19/22 0754 03/20/22 1116 03/21/22 0751  WBC  --  4.1 4.0 3.3*  LATICACIDVEN 0.7  --   --   --     Microbiology Recent Results (from the past 240 hour(s))  Resp panel by RT-PCR (RSV, Flu A&B, Covid) Anterior Nasal Swab     Status: None   Collection Time: 03/17/22  3:00 PM   Specimen: Anterior Nasal Swab  Result Value Ref Range Status   SARS Coronavirus 2 by RT PCR NEGATIVE NEGATIVE Final    Comment: (NOTE) SARS-CoV-2 target nucleic acids are NOT DETECTED.  The SARS-CoV-2 RNA is generally detectable in upper respiratory specimens during the acute phase of infection. The lowest concentration of SARS-CoV-2 viral copies this assay can detect is 138 copies/mL. A negative result does not preclude SARS-Cov-2 infection and should not be used as the sole basis for treatment or other patient management decisions. A negative result may occur with  improper specimen collection/handling, submission of specimen other than nasopharyngeal swab, presence of viral mutation(s) within the areas targeted by this assay, and inadequate number of viral copies(<138 copies/mL). A negative result must be combined with clinical observations, patient history, and epidemiological information. The expected result is Negative.  Fact Sheet for Patients:  EntrepreneurPulse.com.au  Fact Sheet for Healthcare Providers:  IncredibleEmployment.be  This test is no t yet approved or cleared by the [place] FDA and  has been  authorized for detection and/or diagnosis of SARS-CoV-2 by FDA under an Emergency Use Authorization (EUA). This EUA will remain  in effect (meaning this test can be used) for the duration of the COVID-19 declaration under Section 564(b)(1) of the Act, 21 U.S.C.section 360bbb-3(b)(1), unless the authorization is terminated  or revoked sooner.       Influenza A by PCR NEGATIVE NEGATIVE Final   Influenza B by PCR NEGATIVE NEGATIVE Final    Comment: (NOTE) The Xpert Xpress SARS-CoV-2/FLU/RSV plus assay is intended as an aid in the diagnosis of influenza from Nasopharyngeal swab specimens and should not be used as a  sole basis for treatment. Nasal washings and aspirates are unacceptable for Xpert Xpress SARS-CoV-2/FLU/RSV testing.  Fact Sheet for Patients: EntrepreneurPulse.com.au  Fact Sheet for Healthcare Providers: IncredibleEmployment.be  This test is not yet approved or cleared by the [place] FDA and has been authorized for detection and/or diagnosis of SARS-CoV-2 by FDA under an Emergency Use Authorization (EUA). This EUA will remain in effect (meaning this test can be used) for the duration of the COVID-19 declaration under Section 564(b)(1) of the Act, 21 U.S.C. section 360bbb-3(b)(1), unless the authorization is terminated or revoked.     Resp Syncytial Virus by PCR NEGATIVE NEGATIVE Final    Comment: (NOTE) Fact Sheet for Patients: EntrepreneurPulse.com.au  Fact Sheet for Healthcare Providers: IncredibleEmployment.be  This test is not yet approved or cleared by the [place] FDA and has been authorized for detection and/or diagnosis of SARS-CoV-2 by FDA under an Emergency Use Authorization (EUA). This EUA will remain in effect (meaning this test can be used) for the duration of the COVID-19 declaration under Section 564(b)(1) of the Act, 21 U.S.C. section 360bbb-3(b)(1), unless the  authorization is terminated or revoked.  Performed at [facility], [place] [address] [place], [place] [address]   Blood culture (routine x 2)     Status: None   Collection Time: 03/17/22 10:14 PM   Specimen: BLOOD  Result Value Ref Range Status   Specimen Description   Final    BLOOD BLOOD LEFT HAND Performed at [facility] [address] [place], [place] [address]    Special Requests   Final    BOTTLES DRAWN AEROBIC AND ANAEROBIC Blood Culture results may not be optimal due to an excessive volume of blood received in culture bottles Performed at [facility] [address], [place], [place] [address]    Culture   Final    NO GROWTH 5 DAYS Performed at [facility], [place] [address] [place], [place] [address]    Report Status 03/23/2022 FINAL  Final  Blood culture (routine x 2)     Status: None   Collection Time: 03/17/22 10:18 PM   Specimen: BLOOD  Result Value Ref Range Status   Specimen Description   Final    BLOOD BLOOD LEFT FOREARM Performed at [facility] [address], [place], [place] [address]    Special Requests   Final    BOTTLES DRAWN AEROBIC AND ANAEROBIC Blood Culture results may not be optimal due to an excessive volume of blood received in culture bottles Performed at [facility] [address], [place], [place] [address]    Culture   Final    NO GROWTH 5 DAYS Performed at [facility], [place] [address] [place], [place] [address]    Report Status 03/23/2022 FINAL  Final     Medications:    amLODipine  10 mg Oral Daily   aspirin  81 mg Oral Daily   brimonidine  1 drop Both Eyes BID   carvedilol  12.5 mg Oral BID WC   dorzolamide  1 drop Both Eyes BID   furosemide  80 mg Intravenous Q8H   heparin  5,000 Units Subcutaneous Q8H   hydrALAZINE  50 mg Oral Q8H   insulin aspart  0-5 Units Subcutaneous QHS   insulin aspart  0-9 Units  Subcutaneous TID WC   insulin aspart  2 Units Subcutaneous TID WC   insulin glargine-yfgn  10 Units Subcutaneous QHS  latanoprost  1 drop Both Eyes QHS   pantoprazole  40 mg Oral Daily   pregabalin  75 mg Oral Daily   sertraline  50 mg Oral QODAY   sodium bicarbonate  1,300 mg Oral BID   tamsulosin  0.8 mg Oral QPM   Continuous Infusions:      LOS: 7 days   Charlynne Cousins  [facility]  03/25/2022, 8:48 AM

## 2022-03-25 NOTE — Care Management Important Message (Signed)
Important Message  Patient Details IM Letter given. Name: JEHAD BISONO MRN: [id number] Date of Birth: 1960-10-13   Medicare Important Message Given:  Yes     Kerin Salen 03/25/2022, 2:07 PM

## 2022-03-26 DIAGNOSIS — J189 Pneumonia, unspecified organism: Secondary | ICD-10-CM | POA: Diagnosis not present

## 2022-03-26 DIAGNOSIS — I5033 Acute on chronic diastolic (congestive) heart failure: Secondary | ICD-10-CM | POA: Diagnosis not present

## 2022-03-26 DIAGNOSIS — R0602 Shortness of breath: Secondary | ICD-10-CM | POA: Diagnosis not present

## 2022-03-26 DIAGNOSIS — N184 Chronic kidney disease, stage 4 (severe): Secondary | ICD-10-CM | POA: Diagnosis not present

## 2022-03-26 LAB — GLUCOSE, CAPILLARY
Glucose-Capillary: 82 mg/dL (ref 70–99)
Glucose-Capillary: 147 mg/dL — ABNORMAL HIGH (ref 70–99)
Glucose-Capillary: 131 mg/dL — ABNORMAL HIGH (ref 70–99)
Glucose-Capillary: 148 mg/dL — ABNORMAL HIGH (ref 70–99)

## 2022-03-26 LAB — BASIC METABOLIC PANEL
Anion gap: 12 (ref 5–15)
BUN: 75 mg/dL — ABNORMAL HIGH (ref 6–20)
CO2: 18 mmol/L — ABNORMAL LOW (ref 22–32)
Calcium: 8.2 mg/dL — ABNORMAL LOW (ref 8.9–10.3)
Chloride: 105 mmol/L (ref 98–111)
Creatinine, Ser: 3.49 mg/dL — ABNORMAL HIGH (ref 0.61–1.24)
GFR, Estimated: 19 mL/min — ABNORMAL LOW (ref >60)
Glucose, Bld: 115 mg/dL — ABNORMAL HIGH (ref 70–99)
Potassium: 3.8 mmol/L (ref 3.5–5.1)
Sodium: 135 mmol/L (ref 135–145)

## 2022-03-26 LAB — ALBUMIN: Albumin: 3 g/dL — ABNORMAL LOW (ref 3.5–5.0)

## 2022-03-26 MED ORDER — DARBEPOETIN ALFA 100 MCG/0.5ML IJ SOSY
100.0000 ug | PREFILLED_SYRINGE | Freq: Once | INTRAMUSCULAR | Status: AC
  Administered 2022-03-26: 100 ug via SUBCUTANEOUS
  Filled 2022-03-26: qty 0.5

## 2022-03-26 NOTE — Progress Notes (Signed)
[facility] PROGRESS NOTE    Progress Note  HONOR [name]  PXT:[id number] DOB: 02-21-1961 DOA: 03/17/2022 PCP: Jodi Marble, MD     Brief Narrative:   [name] is an 62 y.o. male past medical history of chronic diastolic heart failure, chronic kidney disease stage IV, diabetes mellitus type 2, peripheral vascular disease with a BKA brought in for shortness of breath, CT of the chest showed groundglass opacities  Assessment/Plan:   Acute respiratory failure with hypoxia secondary to pulmonary edema/chronic diastolic heart failure: In the setting of that chronic diastolic heart failure. Continues to be negative about 9-1/2 L, diuretic management per nephrology. blood pressure is well-controlled. Creatinine is rising with holding diuretic therapy.  Chronic kidney disease stage IV: Cont bicarbonate tablets.  Essential hypertension: Currently on Norvasc and Coreg, blood pressures relatively controlled Continue hydralazine as needed.    Chest pain: Unlikely cardiac improved with GI cocktail.  Peripheral vascular disease/diabetes mellitus type 2: With an A1c of 6.1. Continue Lyrica. Continue long-acting insulin per sliding scale, blood glucose fairly controlled.  Normocytic anemia: Continue to monitor.  Leukopenia: Borderline we will need to follow-up as an outpatient with PCP.  Significant results are present on admission unstageable RN Pressure Injury Documentation: Pressure Injury 03/02/21 Coccyx (Active)  03/02/21 1740  Location: Coccyx  Location Orientation:   Staging:   Wound Description (Comments):   Present on Admission:      Pressure Injury 12/08/21 Other (Comment) Left;Posterior Unstageable - Full thickness tissue loss in which the base of the injury is covered by slough (yellow, tan, gray, green or brown) and/or eschar (tan, brown or black) in the wound bed. (Active)  12/08/21 2300  Location: Other (Comment)  Location Orientation:  Left;Posterior  Staging: Unstageable - Full thickness tissue loss in which the base of the injury is covered by slough (yellow, tan, gray, green or brown) and/or eschar (tan, brown or black) in the wound bed.  Wound Description (Comments):   Present on Admission: Yes      DVT prophylaxis: lovenox Family Communication:none Status is: Inpatient Remains inpatient appropriate because: Remains inpatient for heart failure    Code Status:     Code Status Orders  (From admission, onward)           Start     Ordered   03/17/22 2133  Full code  Continuous       Question:  By:  Answer:  Consent: discussion documented in EHR   03/17/22 2134           Code Status History     Date Active Date Inactive Code Status Order ID Comments User Context   03/17/2022 2101 03/17/2022 2134 Full Code [id number]  Kristopher Oppenheim, DO ED   12/08/2021 2207 12/11/2021 1757 Full Code [id number]  Lenore Cordia, MD ED   10/19/2021 1602 11/01/2021 2202 Full Code [id number]  Eppie Gibson, MD ED   03/01/2021 1507 03/13/2021 0408 Full Code [id number]  Karmen Bongo, MD Inpatient   11/18/2020 2041 12/12/2020 0309 Full Code [id number]  Orene Desanctis, DO ED   08/22/2020 0531 09/22/2020 1954 Full Code [id number]  Etta Quill, DO ED   06/30/2020 1243 07/05/2020 2345 Full Code [id number]  Geradine Girt, DO ED   06/25/2020 0944 06/28/2020 2024 Full Code [id number]  Norval Morton, MD ED   01/17/2020 1708 01/31/2020 1753 DNR [id number]  Rosezella Rumpf, NP Inpatient   12/31/2019 2311 01/17/2020 1708 Full Code [id number]  Opyd, Ilene Qua,  MD ED   12/31/2017 1739 01/02/2018 1315 Full Code [id number]  [name], [name], MD Inpatient   09/28/2017 1434 10/01/2017 1948 Full Code [id number]  [name], Romelle Starcher, DO ED   07/30/2017 2111 08/04/2017 1746 Full Code [id number]  [name], NP Inpatient   07/29/2017 2347 07/30/2017 2033 Full Code [id number]  Recardo Evangelist, PA-C ED   01/08/2015 2013 01/09/2015 2017 Full Code [id number]  [name], MD Inpatient   01/04/2015 1915 01/08/2015 2013 Full Code [id number]  [name], MD Inpatient   12/01/2014 1735 12/02/2014 1242 Full Code [id number]  [name], MD ED   08/28/2013 1748 08/31/2013 1627 Full Code [id number]  [name], NP Inpatient   08/28/2013 1301 08/28/2013 1748 Full Code [id number]  [name], MD ED   05/10/2011 2141 05/12/2011 1747 Full Code [id number]  [name], PA-C ED         IV Access:   Peripheral IV   Procedures and diagnostic studies:   No results found.   Medical Consultants:   None.   Subjective:    QURAN VASCO no complaints Objective:    Vitals:   03/25/22 0448 03/25/22 1408 03/25/22 2026 03/26/22 0516  BP: (!) 140/75 (!) 147/88 130/77 134/84  Pulse: 69 71 72 70  Resp: '19 20 18 19  '$ Temp: 98.5 F (36.9 C) 98.6 F (37 C) 97.9 F (36.6 C) 97.8 F (36.6 C)  TempSrc: Oral   Oral  SpO2: 98% 97% 99% 99%  Weight: 116 kg   116.8 kg  Height:       SpO2: 99 % O2 Flow Rate (L/min): 2 L/min   Intake/Output Summary (Last 24 hours) at 03/26/2022 0852 Last data filed at 03/26/2022 0500 Gross per 24 hour  Intake 446.88 ml  Output 300 ml  Net 146.88 ml    Filed Weights   03/24/22 0440 03/25/22 0448 03/26/22 0516  Weight: 112 kg 116 kg 116.8 kg    Exam: General exam: In no acute distress. Respiratory system: Good air movement and clear to auscultation. Cardiovascular system: S1 & S2 heard, RRR. No JVD. Gastrointestinal system: Abdomen is nondistended, soft and nontender.  Extremities: 3+ edema Skin: No rashes, lesions or ulcers Psychiatry: Judgement and insight appear normal. Mood & affect appropriate.  Data Reviewed:    Labs: Basic Metabolic Panel: Recent Labs  Lab 03/22/22 0956 03/23/22 0755 03/24/22 0534 03/25/22 0935 03/26/22 0500  NA 138 137 137 134* 135  K 3.5 3.5 3.3* 3.4* 3.8  CL 106 106 105 105 105  CO2 '23 23 23 22 '$ 18*  GLUCOSE 134* 117* 101* 131* 115*  BUN 64* 64* 64* 76* 75*   CREATININE 3.05* 3.13* 3.00* 2.91* 3.49*  CALCIUM 8.2* 8.0* 8.2* 7.9* 8.2*    GFR Estimated Creatinine Clearance: 28 mL/min (A) (by C-G formula based on SCr of 3.49 mg/dL (H)). Liver Function Tests: Recent Labs  Lab 03/25/22 0935 03/26/22 0500  ALBUMIN 2.4* 3.0*    No results for input(s): "LIPASE", "AMYLASE" in the last 168 hours. No results for input(s): "AMMONIA" in the last 168 hours. Coagulation profile No results for input(s): "INR", "PROTIME" in the last 168 hours. COVID-19 Labs  No results for input(s): "DDIMER", "FERRITIN", "LDH", "CRP" in the last 72 hours.   Lab Results  Component Value Date   SARSCOV2NAA NEGATIVE 03/17/2022   [place] NEGATIVE 12/08/2021   [place] NEGATIVE 10/19/2021   [place] NEGATIVE 03/12/2021    CBC: Recent Labs  Lab 03/20/22 1116 03/21/22 0751  WBC 4.0 3.3*  HGB 7.9* 7.6*  HCT 25.4* 24.7*  MCV 82.7 82.9  PLT 167 175    Cardiac Enzymes: No results for input(s): "CKTOTAL", "CKMB", "CKMBINDEX", "TROPONINI" in the last 168 hours. BNP (last 3 results) No results for input(s): "PROBNP" in the last 8760 hours. CBG: Recent Labs  Lab 03/25/22 0734 03/25/22 1159 03/25/22 1645 03/25/22 2103 03/26/22 0748  GLUCAP 111* 123* 141* 176* 82    D-Dimer: No results for input(s): "DDIMER" in the last 72 hours. Hgb A1c: No results for input(s): "HGBA1C" in the last 72 hours.  Lipid Profile: No results for input(s): "CHOL", "HDL", "LDLCALC", "TRIG", "CHOLHDL", "LDLDIRECT" in the last 72 hours. Thyroid function studies: No results for input(s): "TSH", "T4TOTAL", "T3FREE", "THYROIDAB" in the last 72 hours.  Invalid input(s): "FREET3" Anemia work up: No results for input(s): "VITAMINB12", "FOLATE", "FERRITIN", "TIBC", "IRON", "RETICCTPCT" in the last 72 hours.  Sepsis Labs: Recent Labs  Lab 03/20/22 1116 03/21/22 0751  WBC 4.0 3.3*    Microbiology Recent Results (from the past 240 hour(s))  Resp panel by RT-PCR  (RSV, Flu A&B, Covid) Anterior Nasal Swab     Status: None   Collection Time: 03/17/22  3:00 PM   Specimen: Anterior Nasal Swab  Result Value Ref Range Status   SARS Coronavirus 2 by RT PCR NEGATIVE NEGATIVE Final    Comment: (NOTE) SARS-CoV-2 target nucleic acids are NOT DETECTED.  The SARS-CoV-2 RNA is generally detectable in upper respiratory specimens during the acute phase of infection. The lowest concentration of SARS-CoV-2 viral copies this assay can detect is 138 copies/mL. A negative result does not preclude SARS-Cov-2 infection and should not be used as the sole basis for treatment or other patient management decisions. A negative result may occur with  improper specimen collection/handling, submission of specimen other than nasopharyngeal swab, presence of viral mutation(s) within the areas targeted by this assay, and inadequate number of viral copies(<138 copies/mL). A negative result must be combined with clinical observations, patient history, and epidemiological information. The expected result is Negative.  Fact Sheet for Patients:  EntrepreneurPulse.com.au  Fact Sheet for Healthcare Providers:  IncredibleEmployment.be  This test is no t yet approved or cleared by the [place] FDA and  has been authorized for detection and/or diagnosis of SARS-CoV-2 by FDA under an Emergency Use Authorization (EUA). This EUA will remain  in effect (meaning this test can be used) for the duration of the COVID-19 declaration under Section 564(b)(1) of the Act, 21 U.S.C.section 360bbb-3(b)(1), unless the authorization is terminated  or revoked sooner.       Influenza A by PCR NEGATIVE NEGATIVE Final   Influenza B by PCR NEGATIVE NEGATIVE Final    Comment: (NOTE) The Xpert Xpress SARS-CoV-2/FLU/RSV plus assay is intended as an aid in the diagnosis of influenza from Nasopharyngeal swab specimens and should not be used as a sole basis for  treatment. Nasal washings and aspirates are unacceptable for Xpert Xpress SARS-CoV-2/FLU/RSV testing.  Fact Sheet for Patients: EntrepreneurPulse.com.au  Fact Sheet for Healthcare Providers: IncredibleEmployment.be  This test is not yet approved or cleared by the [place] FDA and has been authorized for detection and/or diagnosis of SARS-CoV-2 by FDA under an Emergency Use Authorization (EUA). This EUA will remain in effect (meaning this test can be used) for the duration of the COVID-19 declaration under Section 564(b)(1) of the Act, 21 U.S.C. section 360bbb-3(b)(1), unless the authorization is terminated or revoked.     Resp Syncytial Virus by PCR NEGATIVE  NEGATIVE Final    Comment: (NOTE) Fact Sheet for Patients: EntrepreneurPulse.com.au  Fact Sheet for Healthcare Providers: IncredibleEmployment.be  This test is not yet approved or cleared by the [place] FDA and has been authorized for detection and/or diagnosis of SARS-CoV-2 by FDA under an Emergency Use Authorization (EUA). This EUA will remain in effect (meaning this test can be used) for the duration of the COVID-19 declaration under Section 564(b)(1) of the Act, 21 U.S.C. section 360bbb-3(b)(1), unless the authorization is terminated or revoked.  Performed at [facility], [place] [address] [place], [place] [address]   Blood culture (routine x 2)     Status: None   Collection Time: 03/17/22 10:14 PM   Specimen: BLOOD  Result Value Ref Range Status   Specimen Description   Final    BLOOD BLOOD LEFT HAND Performed at [place] [address], [place], [place] [address]    Special Requests   Final    BOTTLES DRAWN AEROBIC AND ANAEROBIC Blood Culture results may not be optimal due to an excessive volume of blood received in culture bottles Performed at [place]  [address], [place], [place] [address]    Culture   Final    NO GROWTH 5 DAYS Performed at [facility], [place] [address], [place], [place] [address]    Report Status 03/23/2022 FINAL  Final  Blood culture (routine x 2)     Status: None   Collection Time: 03/17/22 10:18 PM   Specimen: BLOOD  Result Value Ref Range Status   Specimen Description   Final    BLOOD BLOOD LEFT FOREARM Performed at [facility] [address], [place], [place] [address]    Special Requests   Final    BOTTLES DRAWN AEROBIC AND ANAEROBIC Blood Culture results may not be optimal due to an excessive volume of blood received in culture bottles Performed at [facility] [address] [place], [place] [address]    Culture   Final    NO GROWTH 5 DAYS Performed at [facility], [place] [address] [place], [place] [address]    Report Status 03/23/2022 FINAL  Final     Medications:    amLODipine  10 mg Oral Daily   aspirin  81 mg Oral Daily   brimonidine  1 drop Both Eyes BID   carvedilol  12.5 mg Oral BID WC   darbepoetin (ARANESP) injection - NON-DIALYSIS  100 mcg Subcutaneous Once   dorzolamide  1 drop Both Eyes BID   heparin  5,000 Units Subcutaneous Q8H   hydrALAZINE  50 mg Oral Q8H   insulin aspart  0-5 Units Subcutaneous QHS   insulin aspart  0-9 Units Subcutaneous TID WC   insulin aspart  2 Units Subcutaneous TID WC   insulin glargine-yfgn  10 Units Subcutaneous QHS   latanoprost  1 drop Both Eyes QHS   pantoprazole  40 mg Oral Daily   pregabalin  75 mg Oral Daily   sertraline  50 mg Oral QODAY   sodium bicarbonate  1,300 mg Oral BID   tamsulosin  0.8 mg Oral QPM   Continuous Infusions:  albumin human 25 g (03/26/22 0223)       LOS: 8 days   Charlynne Cousins  [facility]  03/26/2022, 8:52 AM

## 2022-03-26 NOTE — Progress Notes (Signed)
[facility] NEPHROLOGY PROGRESS NOTE  Assessment/ Plan: Pt is a 62 y.o. yo male with history of CHF, DM, peripheral vascular disease status post left BKA, stroke, HTN, BPH, CKD stage IV, presented with shortness of breath and hypoxia seen as a consultation for AKI on CKD.  # Acute kidney injury on CKD stage IV with baseline creatinine level 2.5-2.6.  AKI thought to be due to decompensated CHF with anasarca/pulm edema.  Received IV Lasix and albumin to increase diuresis.  His breathing is much better and currently on room air.  However both BUN and creatinine level trending up therefore I will hold Lasix further.  The dose was already reduced yesterday.  I will continue to monitor renal function by strict ins and out, daily lab.  No need for dialysis.  I have discussed this with the patient.  # Hypokalemia: Replete potassium chloride.  Monitor lab.  # Hypertension: Blood pressure acceptable.  Continue current antihypertensive medication diuretics as above.  # Acute respiratory failure with hypoxia due to acute pulmonary edema/CHF.  Management as above.  Currently on room air.  # Metabolic acidosis: On sodium bicarbonate.  Monitor serum CO2 level.  # Anemia partly due to CKD.  Received IV iron twice.  I will order a dose of Aranesp.  Subjective: I have seen and examined at bedside.  With albumin and Lasix urine output was 1 L.  Denies chest pain, shortness of breath, nausea or vomiting. Objective Vital signs in last 24 hours: Vitals:   03/25/22 0448 03/25/22 1408 03/25/22 2026 03/26/22 0516  BP: (!) 140/75 (!) 147/88 130/77 134/84  Pulse: 69 71 72 70  Resp: '19 20 18 19  '$ Temp: 98.5 F (36.9 C) 98.6 F (37 C) 97.9 F (36.6 C) 97.8 F (36.6 C)  TempSrc: Oral   Oral  SpO2: 98% 97% 99% 99%  Weight: 116 kg   116.8 kg  Height:       Weight change: 0.8 kg  Intake/Output Summary (Last 24 hours) at 03/26/2022 0744 Last data filed at 03/26/2022 0500 Gross per 24 hour  Intake  446.88 ml  Output 1000 ml  Net -553.12 ml        Labs: RENAL PANEL Recent Labs    10/20/21 1259 10/21/21 0234 10/21/21 1501 10/23/21 0947 10/24/21 1051 10/25/21 0451 10/26/21 0905 10/27/21 0819 10/28/21 0659 10/29/21 0251 10/30/21 0220 10/31/21 1249 11/01/21 0952 12/08/21 1919 12/09/21 0526 03/17/22 1440 03/18/22 0404 03/19/22 0754 03/20/22 1116 03/21/22 0751 03/22/22 0956 03/23/22 0755 03/24/22 0534 03/25/22 0935 03/26/22 0500  NA 140 139   < > '138 138 139 138 139 138 139 139 138 139 '$ 139   < > 134* 139 139 138 139 138 137 137 134* 135  K 5.0 4.6   < > 3.8 4.1 3.8 3.5 3.5 3.2* 3.0* 3.3* 3.3* 3.1* 5.1   < > 4.3 4.1 4.1 4.1 3.7 3.5 3.5 3.3* 3.4* 3.8  CL 117* 116*   < > 113* 113* 113* 112* 112* 111 109 105 106 106 115*   < > 112* 113* 111 109 110 106 106 105 105 105  CO2 16* 16*   < > 16* 17* 18* 18* 21* 19* 20* 22 21* 23 20*   < > 17* 20* 21* 22 21* '23 23 23 22 '$ 18*  GLUCOSE 151* 124*   < > 144* 149* 138* 136* 130* 146* 130* 185* 167* 113* 126*   < > 171* 104* 113* 100* 136* 134* 117* 101* 131* 115*  BUN 43* 45*   < > 48* 47* 47* 48* 49* 50* 48* 49* 53* 55* 39*   < > 57* 53* 60* 56* 58* 64* 64* 64* 76* 75*  CREATININE 2.20* 2.19*   < > 3.37* 3.58* 3.74* 3.79* 3.69* 3.45* 3.33* 3.14* 3.02* 2.89* 2.53*   < > 3.22* 3.17* 3.32* 3.01* 2.87* 3.05* 3.13* 3.00* 2.91* 3.49*  CALCIUM 8.3* 7.9*   < > 7.9* 7.7* 7.7* 7.8* 7.9* 7.8* 8.1* 8.2* 8.2* 8.4* 8.4*   < > 7.8* 8.0* 8.0* 7.9* 8.1* 8.2* 8.0* 8.2* 7.9* 8.2*  MG 1.9 1.9  --   --   --  1.9  --   --   --  1.7  --  1.6* 1.6*  --   --   --   --   --   --   --   --   --   --   --   --   PHOS  --   --   --  5.9* 6.1* 6.0* 5.9* 5.8* 5.6* 5.5* 4.7* 4.5  --   --   --   --   --   --   --   --   --   --   --   --   --   ALBUMIN  --   --   --  2.3* 2.0* 2.0* 2.1* 2.0* 1.9* 2.0* 2.0* 2.0*  --  2.6*  --  2.7* 2.5*  --   --   --   --   --   --  2.4* 3.0*   < > = values in this interval not displayed.      Liver Function Tests: Recent Labs   Lab 03/25/22 0935 03/26/22 0500  ALBUMIN 2.4* 3.0*   No results for input(s): "LIPASE", "AMYLASE" in the last 168 hours. No results for input(s): "AMMONIA" in the last 168 hours. CBC: Recent Labs    10/20/21 0449 10/21/21 0234 03/17/22 1440 03/18/22 0404 03/19/22 0754 03/20/22 1116 03/21/22 0751  HGB  --    < > 8.1* 7.4* 7.5* 7.9* 7.6*  MCV  --    < > 83.9 84.1 83.8 82.7 82.9  VITAMINB12 439  --   --   --  321  --   --   FOLATE 6.9  --   --   --  9.3  --   --   FERRITIN 164  --   --   --  235  --   --   TIBC 227*  --   --   --  220*  --   --   IRON 12*  --   --   --  26*  --   --   RETICCTPCT 2.6  --   --   --  1.5  --   --    < > = values in this interval not displayed.     Cardiac Enzymes: No results for input(s): "CKTOTAL", "CKMB", "CKMBINDEX", "TROPONINI" in the last 168 hours. CBG: Recent Labs  Lab 03/24/22 2107 03/25/22 0734 03/25/22 1159 03/25/22 1645 03/25/22 2103  GLUCAP 160* 111* 123* 141* 176*     Iron Studies: No results for input(s): "IRON", "TIBC", "TRANSFERRIN", "FERRITIN" in the last 72 hours. Studies/Results: No results found.  Medications: Infusions:  albumin human 25 g (03/26/22 0223)    Scheduled Medications:  amLODipine  10 mg Oral Daily   aspirin  81 mg Oral Daily   brimonidine  1 drop Both  Eyes BID   carvedilol  12.5 mg Oral BID WC   dorzolamide  1 drop Both Eyes BID   heparin  5,000 Units Subcutaneous Q8H   hydrALAZINE  50 mg Oral Q8H   insulin aspart  0-5 Units Subcutaneous QHS   insulin aspart  0-9 Units Subcutaneous TID WC   insulin aspart  2 Units Subcutaneous TID WC   insulin glargine-yfgn  10 Units Subcutaneous QHS   latanoprost  1 drop Both Eyes QHS   pantoprazole  40 mg Oral Daily   pregabalin  75 mg Oral Daily   sertraline  50 mg Oral QODAY   sodium bicarbonate  1,300 mg Oral BID   tamsulosin  0.8 mg Oral QPM    have reviewed scheduled and prn medications.  Physical Exam: General:NAD, comfortable Heart:RRR,  s1s2 nl Lungs:clear b/l, no crackle Abdomen:soft, Non-tender, non-distended Extremities: Lower extremity pitting edema, chronic skin changes, stump edema. Neurology: Alert awake and following commands.  Kano Heckmann Prasad Sahmir Weatherbee 03/26/2022,7:44 AM  LOS: 8 days

## 2022-03-27 DIAGNOSIS — I5033 Acute on chronic diastolic (congestive) heart failure: Secondary | ICD-10-CM | POA: Diagnosis not present

## 2022-03-27 LAB — GLUCOSE, CAPILLARY
Glucose-Capillary: 113 mg/dL — ABNORMAL HIGH (ref 70–99)
Glucose-Capillary: 145 mg/dL — ABNORMAL HIGH (ref 70–99)
Glucose-Capillary: 154 mg/dL — ABNORMAL HIGH (ref 70–99)
Glucose-Capillary: 135 mg/dL — ABNORMAL HIGH (ref 70–99)

## 2022-03-27 LAB — BASIC METABOLIC PANEL
Anion gap: 9 (ref 5–15)
BUN: 87 mg/dL — ABNORMAL HIGH (ref 6–20)
CO2: 20 mmol/L — ABNORMAL LOW (ref 22–32)
Calcium: 7.8 mg/dL — ABNORMAL LOW (ref 8.9–10.3)
Chloride: 104 mmol/L (ref 98–111)
Creatinine, Ser: 3.57 mg/dL — ABNORMAL HIGH (ref 0.61–1.24)
GFR, Estimated: 19 mL/min — ABNORMAL LOW (ref >60)
Glucose, Bld: 111 mg/dL — ABNORMAL HIGH (ref 70–99)
Potassium: 3.4 mmol/L — ABNORMAL LOW (ref 3.5–5.1)
Sodium: 133 mmol/L — ABNORMAL LOW (ref 135–145)

## 2022-03-27 LAB — CBC
HCT: 23.4 % — ABNORMAL LOW (ref 39.0–52.0)
Hemoglobin: 7.2 g/dL — ABNORMAL LOW (ref 13.0–17.0)
MCH: 25.5 pg — ABNORMAL LOW (ref 26.0–34.0)
MCHC: 30.8 g/dL (ref 30.0–36.0)
MCV: 83 fL (ref 80.0–100.0)
Platelets: 184 10*3/uL (ref 150–400)
RBC: 2.82 MIL/uL — ABNORMAL LOW (ref 4.22–5.81)
RDW: 16.7 % — ABNORMAL HIGH (ref 11.5–15.5)
WBC: 3.2 10*3/uL — ABNORMAL LOW (ref 4.0–10.5)
nRBC: 0 % (ref 0.0–0.2)

## 2022-03-27 LAB — BRAIN NATRIURETIC PEPTIDE: B Natriuretic Peptide: 242 pg/mL — ABNORMAL HIGH (ref 0.0–100.0)

## 2022-03-27 LAB — ALBUMIN: Albumin: 3 g/dL — ABNORMAL LOW (ref 3.5–5.0)

## 2022-03-27 MED ORDER — METOPROLOL TARTRATE 5 MG/5ML IV SOLN: 5.0000 mg | INTRAVENOUS | Status: DC | PRN

## 2022-03-27 MED ORDER — TRAZODONE HCL 50 MG PO TABS: 50.0000 mg | ORAL_TABLET | Freq: Every evening | ORAL | Status: DC | PRN

## 2022-03-27 MED ORDER — SENNOSIDES-DOCUSATE SODIUM 8.6-50 MG PO TABS: 1.0000 | ORAL_TABLET | Freq: Every evening | ORAL | Status: DC | PRN

## 2022-03-27 MED ORDER — POTASSIUM CHLORIDE CRYS ER 20 MEQ PO TBCR
20.0000 meq | EXTENDED_RELEASE_TABLET | Freq: Once | ORAL | Status: AC
  Administered 2022-03-27: 20 meq via ORAL
  Filled 2022-03-27: qty 1

## 2022-03-27 MED ORDER — IPRATROPIUM-ALBUTEROL 0.5-2.5 (3) MG/3ML IN SOLN: 3.0000 mL | RESPIRATORY_TRACT | Status: DC | PRN

## 2022-03-27 MED ORDER — GUAIFENESIN 100 MG/5ML PO LIQD
5.0000 mL | ORAL | Status: DC | PRN
  Administered 2022-04-02: 5 mL via ORAL
  Filled 2022-03-27: qty 10

## 2022-03-27 MED ORDER — HYDRALAZINE HCL 20 MG/ML IJ SOLN: 10.0000 mg | INTRAMUSCULAR | Status: DC | PRN

## 2022-03-27 NOTE — Progress Notes (Signed)
[facility] NEPHROLOGY PROGRESS NOTE  Assessment/ Plan: Pt is a 62 y.o. yo male with history of CHF, DM, peripheral vascular disease status post left BKA, stroke, HTN, BPH, CKD stage IV, presented with shortness of breath and hypoxia seen as a consultation for AKI on CKD.  # Acute kidney injury on CKD stage IV with baseline creatinine level 2.5-2.6.  AKI thought to be due to decompensated CHF with anasarca/pulm edema.  Received IV Lasix and albumin to increase diuresis.  His breathing is much better and currently on room air.  The diuretics was held on 1/30 because of rising BUN and creatinine level.  He remains nonoliguric, follow-up lab results from today. I will continue to monitor renal function by strict ins and out, daily lab.  No need for dialysis.  I have discussed this with the patient.  # Hypokalemia: Replete potassium chloride.  Monitor lab.  # Hypertension: Blood pressure acceptable.  Continue current antihypertensive medication diuretics as above.  # Acute respiratory failure with hypoxia due to acute pulmonary edema/CHF.  Management as above.  Currently on room air.  # Metabolic acidosis: On sodium bicarbonate.  Monitor serum CO2 level.  # Anemia partly due to CKD.  Received IV iron twice.  Received Aranesp on 1/30.  Monitor CBC..  Subjective: I have seen and examined at bedside.  No major event.  Urine output is recorded only 800 cc.  Labs are pending from today.  He denies nausea vomiting chest pain or shortness of breath. Objective Vital signs in last 24 hours: Vitals:   03/26/22 1240 03/26/22 2136 03/27/22 0442 03/27/22 0522  BP: 125/72 (!) 127/95 130/78   Pulse: 70 69 68   Resp: '18 15 17   '$ Temp: (!) 97.5 F (36.4 C) 98.5 F (36.9 C) 97.8 F (36.6 C)   TempSrc: Oral Oral Oral   SpO2: 100% 100% 98%   Weight:    115.3 kg  Height:       Weight change: -1.496 kg  Intake/Output Summary (Last 24 hours) at 03/27/2022 0824 Last data filed at 03/27/2022  0300 Gross per 24 hour  Intake 1220 ml  Output 800 ml  Net 420 ml        Labs: RENAL PANEL Recent Labs    10/20/21 1259 10/21/21 0234 10/21/21 1501 10/23/21 0947 10/24/21 1051 10/25/21 0451 10/26/21 0905 10/27/21 0819 10/28/21 0659 10/29/21 0251 10/30/21 0220 10/31/21 1249 11/01/21 0952 12/08/21 1919 12/09/21 0526 03/17/22 1440 03/18/22 0404 03/19/22 0754 03/20/22 1116 03/21/22 0751 03/22/22 0956 03/23/22 0755 03/24/22 0534 03/25/22 0935 03/26/22 0500  NA 140 139   < > '138 138 139 138 139 138 139 139 138 139 '$ 139   < > 134* 139 139 138 139 138 137 137 134* 135  K 5.0 4.6   < > 3.8 4.1 3.8 3.5 3.5 3.2* 3.0* 3.3* 3.3* 3.1* 5.1   < > 4.3 4.1 4.1 4.1 3.7 3.5 3.5 3.3* 3.4* 3.8  CL 117* 116*   < > 113* 113* 113* 112* 112* 111 109 105 106 106 115*   < > 112* 113* 111 109 110 106 106 105 105 105  CO2 16* 16*   < > 16* 17* 18* 18* 21* 19* 20* 22 21* 23 20*   < > 17* 20* 21* 22 21* '23 23 23 22 '$ 18*  GLUCOSE 151* 124*   < > 144* 149* 138* 136* 130* 146* 130* 185* 167* 113* 126*   < > 171* 104* 113* 100* 136*  134* 117* 101* 131* 115*  BUN 43* 45*   < > 48* 47* 47* 48* 49* 50* 48* 49* 53* 55* 39*   < > 57* 53* 60* 56* 58* 64* 64* 64* 76* 75*  CREATININE 2.20* 2.19*   < > 3.37* 3.58* 3.74* 3.79* 3.69* 3.45* 3.33* 3.14* 3.02* 2.89* 2.53*   < > 3.22* 3.17* 3.32* 3.01* 2.87* 3.05* 3.13* 3.00* 2.91* 3.49*  CALCIUM 8.3* 7.9*   < > 7.9* 7.7* 7.7* 7.8* 7.9* 7.8* 8.1* 8.2* 8.2* 8.4* 8.4*   < > 7.8* 8.0* 8.0* 7.9* 8.1* 8.2* 8.0* 8.2* 7.9* 8.2*  MG 1.9 1.9  --   --   --  1.9  --   --   --  1.7  --  1.6* 1.6*  --   --   --   --   --   --   --   --   --   --   --   --   PHOS  --   --   --  5.9* 6.1* 6.0* 5.9* 5.8* 5.6* 5.5* 4.7* 4.5  --   --   --   --   --   --   --   --   --   --   --   --   --   ALBUMIN  --   --   --  2.3* 2.0* 2.0* 2.1* 2.0* 1.9* 2.0* 2.0* 2.0*  --  2.6*  --  2.7* 2.5*  --   --   --   --   --   --  2.4* 3.0*   < > = values in this interval not displayed.      Liver  Function Tests: Recent Labs  Lab 03/25/22 0935 03/26/22 0500  ALBUMIN 2.4* 3.0*    No results for input(s): "LIPASE", "AMYLASE" in the last 168 hours. No results for input(s): "AMMONIA" in the last 168 hours. CBC: Recent Labs    10/20/21 0449 10/21/21 0234 03/17/22 1440 03/18/22 0404 03/19/22 0754 03/20/22 1116 03/21/22 0751  HGB  --    < > 8.1* 7.4* 7.5* 7.9* 7.6*  MCV  --    < > 83.9 84.1 83.8 82.7 82.9  VITAMINB12 439  --   --   --  321  --   --   FOLATE 6.9  --   --   --  9.3  --   --   FERRITIN 164  --   --   --  235  --   --   TIBC 227*  --   --   --  220*  --   --   IRON 12*  --   --   --  26*  --   --   RETICCTPCT 2.6  --   --   --  1.5  --   --    < > = values in this interval not displayed.     Cardiac Enzymes: No results for input(s): "CKTOTAL", "CKMB", "CKMBINDEX", "TROPONINI" in the last 168 hours. CBG: Recent Labs  Lab 03/26/22 0748 03/26/22 1157 03/26/22 1635 03/26/22 2134 03/27/22 0720  GLUCAP 82 147* 131* 148* 113*     Iron Studies: No results for input(s): "IRON", "TIBC", "TRANSFERRIN", "FERRITIN" in the last 72 hours. Studies/Results: No results found.  Medications: Infusions:    Scheduled Medications:  amLODipine  10 mg Oral Daily   aspirin  81 mg Oral Daily   brimonidine  1 drop Both  Eyes BID   carvedilol  12.5 mg Oral BID WC   dorzolamide  1 drop Both Eyes BID   heparin  5,000 Units Subcutaneous Q8H   hydrALAZINE  50 mg Oral Q8H   insulin aspart  0-5 Units Subcutaneous QHS   insulin aspart  0-9 Units Subcutaneous TID WC   insulin aspart  2 Units Subcutaneous TID WC   insulin glargine-yfgn  10 Units Subcutaneous QHS   latanoprost  1 drop Both Eyes QHS   pantoprazole  40 mg Oral Daily   pregabalin  75 mg Oral Daily   sertraline  50 mg Oral QODAY   sodium bicarbonate  1,300 mg Oral BID   tamsulosin  0.8 mg Oral QPM    have reviewed scheduled and prn medications.  Physical Exam: General:NAD, comfortable Heart:RRR, s1s2  nl Lungs:clear b/l, no crackle Abdomen:soft, Non-tender, non-distended Extremities: Lower extremity pitting edema, chronic skin changes, stump edema. Neurology: Alert awake and following commands.  [name] 03/27/2022,8:24 AM  LOS: 9 days

## 2022-03-27 NOTE — Progress Notes (Signed)
PROGRESS NOTE    [name]  QMV:[id number] DOB: 06/14/1960 DOA: 03/17/2022 PCP: Jodi Marble, MD   Brief Narrative:   62 y.o. male past medical history of chronic diastolic heart failure, chronic kidney disease stage IV, diabetes mellitus type 2, peripheral vascular disease with a BKA brought in for shortness of breath, CT of the chest showed groundglass opacitie.  Patient was found to have pulmonary edema requiring diuresis which has been managed by nephrology team.   Assessment & Plan:  Principal Problem:   Acute on chronic diastolic CHF (congestive heart failure) ([place]) Active Problems:   Acute respiratory failure with hypoxia ([place])   Type 2 diabetes mellitus with stage 4 chronic kidney disease and hypertension (HCC)   Morbid (severe) obesity due to excess calories (HCC)   CKD (chronic kidney disease) stage 4, GFR 15-29 ml/min (HCC)   CVA (HCC) with residual right-sided weakness   History of left below knee amputation ([place])   Essential hypertension       Acute respiratory failure with hypoxia secondary to pulmonary edema/chronic diastolic heart failure: This is in setting of decompensated CHF.  Seen by nephrology team, initially received IV Lasix and albumin to help promote diuresis.  BNP 242   Acute kidney injury chronic kidney disease stage IV Metabolic acidosis Creatinine is around 3.0-3.5.  Cont bicarbonate tablets.  Nephrology team following and guiding diuresis Renal US- medical dz.    Essential hypertension: On Norvasc, Coreg.  IV as needed   Chest pain: Resolved Improved with GI cocktail   Peripheral vascular disease/diabetes mellitus type 2: With an A1c of 6.1. Long-acting.  Sliding scale Accu-Chek.  Adjust as necessary. Continue Lyrica   Normocytic anemia: Continue to monitor.  Received Aranesp   Leukopenia: Borderline we will need to follow-up as an outpatient with PCP.    DVT prophylaxis: Subcu heparin Code Status: Full code Family  Communication:    Status is: Inpatient Remains inpatient appropriate because: Continue hospital stay for management of acute kidney injury and fluid overload.  Nephrology following       Subjective: Seen and examined at bedside, does not have any complaints but overall does feel tired.   Examination:  General exam: Appears calm and comfortable  Respiratory system: Clear to auscultation. Respiratory effort normal. Cardiovascular system: S1 & S2 heard, RRR. No JVD, murmurs, rubs, gallops or clicks. No pedal edema. Gastrointestinal system: Abdomen is nondistended, soft and nontender. No organomegaly or masses felt. Normal bowel sounds heard. Central nervous system: Alert and oriented. No focal neurological deficits. Extremities: Left BKA noted Skin: Bilateral lower extremity chronic skin changes Psychiatry: Judgement and insight appear normal. Mood & affect appropriate.   External Foley catheter in place  Objective: Vitals:   03/26/22 1240 03/26/22 2136 03/27/22 0442 03/27/22 0522  BP: 125/72 (!) 127/95 130/78   Pulse: 70 69 68   Resp: '18 15 17   '$ Temp: (!) 97.5 F (36.4 C) 98.5 F (36.9 C) 97.8 F (36.6 C)   TempSrc: Oral Oral Oral   SpO2: 100% 100% 98%   Weight:    115.3 kg  Height:        Intake/Output Summary (Last 24 hours) at 03/27/2022 0840 Last data filed at 03/27/2022 0300 Gross per 24 hour  Intake 1220 ml  Output 800 ml  Net 420 ml   Filed Weights   03/25/22 0448 03/26/22 0516 03/27/22 0522  Weight: 116 kg 116.8 kg 115.3 kg     Data Reviewed:   CBC: Recent Labs  Lab 03/20/22 1116 03/21/22 0751  WBC 4.0 3.3*  HGB 7.9* 7.6*  HCT 25.4* 24.7*  MCV 82.7 82.9  PLT 167 035   Basic Metabolic Panel: Recent Labs  Lab 03/22/22 0956 03/23/22 0755 03/24/22 0534 03/25/22 0935 03/26/22 0500  NA 138 137 137 134* 135  K 3.5 3.5 3.3* 3.4* 3.8  CL 106 106 105 105 105  CO2 '23 23 23 22 '$ 18*  GLUCOSE 134* 117* 101* 131* 115*  BUN 64* 64* 64* 76* 75*   CREATININE 3.05* 3.13* 3.00* 2.91* 3.49*  CALCIUM 8.2* 8.0* 8.2* 7.9* 8.2*   GFR: Estimated Creatinine Clearance: 27.8 mL/min (A) (by C-G formula based on SCr of 3.49 mg/dL (H)). Liver Function Tests: Recent Labs  Lab 03/25/22 0935 03/26/22 0500  ALBUMIN 2.4* 3.0*   No results for input(s): "LIPASE", "AMYLASE" in the last 168 hours. No results for input(s): "AMMONIA" in the last 168 hours. Coagulation Profile: No results for input(s): "INR", "PROTIME" in the last 168 hours. Cardiac Enzymes: No results for input(s): "CKTOTAL", "CKMB", "CKMBINDEX", "TROPONINI" in the last 168 hours. BNP (last 3 results) No results for input(s): "PROBNP" in the last 8760 hours. HbA1C: No results for input(s): "HGBA1C" in the last 72 hours. CBG: Recent Labs  Lab 03/26/22 0748 03/26/22 1157 03/26/22 1635 03/26/22 2134 03/27/22 0720  GLUCAP 82 147* 131* 148* 113*   Lipid Profile: No results for input(s): "CHOL", "HDL", "LDLCALC", "TRIG", "CHOLHDL", "LDLDIRECT" in the last 72 hours. Thyroid Function Tests: No results for input(s): "TSH", "T4TOTAL", "FREET4", "T3FREE", "THYROIDAB" in the last 72 hours. Anemia Panel: No results for input(s): "VITAMINB12", "FOLATE", "FERRITIN", "TIBC", "IRON", "RETICCTPCT" in the last 72 hours. Sepsis Labs: No results for input(s): "PROCALCITON", "LATICACIDVEN" in the last 168 hours.  Recent Results (from the past 240 hour(s))  Resp panel by RT-PCR (RSV, Flu A&B, Covid) Anterior Nasal Swab     Status: None   Collection Time: 03/17/22  3:00 PM   Specimen: Anterior Nasal Swab  Result Value Ref Range Status   SARS Coronavirus 2 by RT PCR NEGATIVE NEGATIVE Final    Comment: (NOTE) SARS-CoV-2 target nucleic acids are NOT DETECTED.  The SARS-CoV-2 RNA is generally detectable in upper respiratory specimens during the acute phase of infection. The lowest concentration of SARS-CoV-2 viral copies this assay can detect is 138 copies/mL. A negative result does not  preclude SARS-Cov-2 infection and should not be used as the sole basis for treatment or other patient management decisions. A negative result may occur with  improper specimen collection/handling, submission of specimen other than nasopharyngeal swab, presence of viral mutation(s) within the areas targeted by this assay, and inadequate number of viral copies(<138 copies/mL). A negative result must be combined with clinical observations, patient history, and epidemiological information. The expected result is Negative.  Fact Sheet for Patients:  EntrepreneurPulse.com.au  Fact Sheet for Healthcare Providers:  IncredibleEmployment.be  This test is no t yet approved or cleared by the [place] FDA and  has been authorized for detection and/or diagnosis of SARS-CoV-2 by FDA under an Emergency Use Authorization (EUA). This EUA will remain  in effect (meaning this test can be used) for the duration of the COVID-19 declaration under Section 564(b)(1) of the Act, 21 U.S.C.section 360bbb-3(b)(1), unless the authorization is terminated  or revoked sooner.       Influenza A by PCR NEGATIVE NEGATIVE Final   Influenza B by PCR NEGATIVE NEGATIVE Final    Comment: (NOTE) The Xpert Xpress SARS-CoV-2/FLU/RSV plus assay is intended  as an aid in the diagnosis of influenza from Nasopharyngeal swab specimens and should not be used as a sole basis for treatment. Nasal washings and aspirates are unacceptable for Xpert Xpress SARS-CoV-2/FLU/RSV testing.  Fact Sheet for Patients: EntrepreneurPulse.com.au  Fact Sheet for Healthcare Providers: IncredibleEmployment.be  This test is not yet approved or cleared by the [place] FDA and has been authorized for detection and/or diagnosis of SARS-CoV-2 by FDA under an Emergency Use Authorization (EUA). This EUA will remain in effect (meaning this test can be used) for the  duration of the COVID-19 declaration under Section 564(b)(1) of the Act, 21 U.S.C. section 360bbb-3(b)(1), unless the authorization is terminated or revoked.     Resp Syncytial Virus by PCR NEGATIVE NEGATIVE Final    Comment: (NOTE) Fact Sheet for Patients: EntrepreneurPulse.com.au  Fact Sheet for Healthcare Providers: IncredibleEmployment.be  This test is not yet approved or cleared by the [place] FDA and has been authorized for detection and/or diagnosis of SARS-CoV-2 by FDA under an Emergency Use Authorization (EUA). This EUA will remain in effect (meaning this test can be used) for the duration of the COVID-19 declaration under Section 564(b)(1) of the Act, 21 U.S.C. section 360bbb-3(b)(1), unless the authorization is terminated or revoked.  Performed at [facility], [place] [address], [place], [place] [address]   Blood culture (routine x 2)     Status: None   Collection Time: 03/17/22 10:14 PM   Specimen: BLOOD  Result Value Ref Range Status   Specimen Description   Final    BLOOD BLOOD LEFT HAND Performed at [facility] [address], [place], [place] [address]    Special Requests   Final    BOTTLES DRAWN AEROBIC AND ANAEROBIC Blood Culture results may not be optimal due to an excessive volume of blood received in culture bottles Performed at [facility] [address] [place], [place] [address]    Culture   Final    NO GROWTH 5 DAYS Performed at [facility], [place] [address], [place], [place] [address]    Report Status 03/23/2022 FINAL  Final  Blood culture (routine x 2)     Status: None   Collection Time: 03/17/22 10:18 PM   Specimen: BLOOD  Result Value Ref Range Status   Specimen Description   Final    BLOOD BLOOD LEFT FOREARM Performed at [facility] [address] [place], [place] [address]    Special Requests   Final     BOTTLES DRAWN AEROBIC AND ANAEROBIC Blood Culture results may not be optimal due to an excessive volume of blood received in culture bottles Performed at [facility] [address], [place], [place] [address]    Culture   Final    NO GROWTH 5 DAYS Performed at [facility], [place] [address] [place], [place] [address]    Report Status 03/23/2022 FINAL  Final         Radiology Studies: No results found.      Scheduled Meds:  amLODipine  10 mg Oral Daily   aspirin  81 mg Oral Daily   brimonidine  1 drop Both Eyes BID   carvedilol  12.5 mg Oral BID WC   dorzolamide  1 drop Both Eyes BID   heparin  5,000 Units Subcutaneous Q8H   hydrALAZINE  50 mg Oral Q8H   insulin aspart  0-5 Units Subcutaneous QHS   insulin aspart  0-9  Units Subcutaneous TID WC   insulin aspart  2 Units Subcutaneous TID WC   insulin glargine-yfgn  10 Units Subcutaneous QHS   latanoprost  1 drop Both Eyes QHS   pantoprazole  40 mg Oral Daily   pregabalin  75 mg Oral Daily   sertraline  50 mg Oral QODAY   sodium bicarbonate  1,300 mg Oral BID   tamsulosin  0.8 mg Oral QPM   Continuous Infusions:   LOS: 9 days   Time spent= 35 mins    [name] Arsenio Loader, MD [facility]  If 7PM-7AM, please contact night-coverage  03/27/2022, 8:40 AM

## 2022-03-28 DIAGNOSIS — I5033 Acute on chronic diastolic (congestive) heart failure: Secondary | ICD-10-CM | POA: Diagnosis not present

## 2022-03-28 LAB — BASIC METABOLIC PANEL
Anion gap: 12 (ref 5–15)
BUN: 88 mg/dL — ABNORMAL HIGH (ref 6–20)
CO2: 21 mmol/L — ABNORMAL LOW (ref 22–32)
Calcium: 8.3 mg/dL — ABNORMAL LOW (ref 8.9–10.3)
Chloride: 104 mmol/L (ref 98–111)
Creatinine, Ser: 3.78 mg/dL — ABNORMAL HIGH (ref 0.61–1.24)
GFR, Estimated: 17 mL/min — ABNORMAL LOW (ref >60)
Glucose, Bld: 103 mg/dL — ABNORMAL HIGH (ref 70–99)
Potassium: 3.8 mmol/L (ref 3.5–5.1)
Sodium: 137 mmol/L (ref 135–145)

## 2022-03-28 LAB — MAGNESIUM: Magnesium: 2.2 mg/dL (ref 1.7–2.4)

## 2022-03-28 LAB — GLUCOSE, CAPILLARY
Glucose-Capillary: 145 mg/dL — ABNORMAL HIGH (ref 70–99)
Glucose-Capillary: 119 mg/dL — ABNORMAL HIGH (ref 70–99)
Glucose-Capillary: 146 mg/dL — ABNORMAL HIGH (ref 70–99)
Glucose-Capillary: 123 mg/dL — ABNORMAL HIGH (ref 70–99)

## 2022-03-28 LAB — ALBUMIN: Albumin: 3.2 g/dL — ABNORMAL LOW (ref 3.5–5.0)

## 2022-03-28 MED ORDER — SODIUM CHLORIDE 0.9 % IV SOLN: INTRAVENOUS | Status: AC

## 2022-03-28 NOTE — Progress Notes (Addendum)
[facility] NEPHROLOGY PROGRESS NOTE  Assessment/ Plan: Pt is a 62 y.o. yo male with history of CHF, DM, peripheral vascular disease status post left BKA, stroke, HTN, BPH, CKD stage IV, presented with shortness of breath and hypoxia seen as a consultation for AKI on CKD.  # Acute kidney injury on CKD stage IV with baseline creatinine level 2.5-2.6.  AKI thought to be due to decompensated CHF with anasarca/pulm edema.  Received IV Lasix and albumin to maintain euvolemia.  The Lasix was held on 1/30 because of elevated BUN and creatinine level.  He is fortunately nonoliguric now and no sign or symptoms of uremia.  Creatinine seems to be plateaued.  Follow-up lab results from today.  He will ultimately needs furosemide to manage his volume once renal function stabilizes.  No urgent indication for dialysis however I discussed with the patient that it may be in near future.  I discussed with the nursing staff to get him out of bed and increase activity.  # Hypokalemia: Replete potassium chloride.  Monitor lab.  # Hypertension: Blood pressure acceptable.  Continue current antihypertensive medication diuretics as above.  # Acute respiratory failure with hypoxia due to acute pulmonary edema/CHF.  Management as above.  Currently on room air.  # Metabolic acidosis: On sodium bicarbonate.  Monitor serum CO2 level.  # Anemia partly due to CKD.  Received IV iron twice.  Received Aranesp on 1/30.  Monitor CBC..  Subjective: I have seen and examined at bedside.  Urine output is around 1 L in 24 hours.  Patient refused blood draw in earlier time but agreed now.  Reports he feels much better and denies any chest pain, shortness of breath, nausea, vomiting.  He is not getting out of the bed.  Objective Vital signs in last 24 hours: Vitals:   03/27/22 1255 03/27/22 2148 03/28/22 0429 03/28/22 0500  BP: (!) 141/77 (!) 146/82 132/75   Pulse: 68 71 67   Resp: '18 20 17   '$ Temp: 97.8 F (36.6 C)  98.1 F (36.7 C) 97.9 F (36.6 C)   TempSrc: Oral Oral Oral   SpO2: 100% 100% 99%   Weight:    116.2 kg  Height:       Weight change: 0.896 kg  Intake/Output Summary (Last 24 hours) at 03/28/2022 0718 Last data filed at 03/28/2022 0300 Gross per 24 hour  Intake 720 ml  Output 1050 ml  Net -330 ml        Labs: RENAL PANEL Recent Labs    10/20/21 1259 10/21/21 0234 10/21/21 1501 10/23/21 0947 10/24/21 1051 10/25/21 0451 10/26/21 0905 10/27/21 0819 10/28/21 0659 10/29/21 0251 10/30/21 0220 10/31/21 1249 11/01/21 0952 12/08/21 1919 12/09/21 0526 03/17/22 1440 03/18/22 0404 03/19/22 0754 03/20/22 1116 03/21/22 0751 03/22/22 0956 03/23/22 0755 03/24/22 0534 03/25/22 0935 03/26/22 0500 03/27/22 0805  NA 140 139   < > '138 138 139 138 139 138 139 139 138 139 '$ 139   < > 134* 139 139 138 139 138 137 137 134* 135 133*  K 5.0 4.6   < > 3.8 4.1 3.8 3.5 3.5 3.2* 3.0* 3.3* 3.3* 3.1* 5.1   < > 4.3 4.1 4.1 4.1 3.7 3.5 3.5 3.3* 3.4* 3.8 3.4*  CL 117* 116*   < > 113* 113* 113* 112* 112* 111 109 105 106 106 115*   < > 112* 113* 111 109 110 106 106 105 105 105 104  CO2 16* 16*   < > 16* 17* 18*  18* 21* 19* 20* 22 21* 23 20*   < > 17* 20* 21* 22 21* '23 23 23 22 '$ 18* 20*  GLUCOSE 151* 124*   < > 144* 149* 138* 136* 130* 146* 130* 185* 167* 113* 126*   < > 171* 104* 113* 100* 136* 134* 117* 101* 131* 115* 111*  BUN 43* 45*   < > 48* 47* 47* 48* 49* 50* 48* 49* 53* 55* 39*   < > 57* 53* 60* 56* 58* 64* 64* 64* 76* 75* 87*  CREATININE 2.20* 2.19*   < > 3.37* 3.58* 3.74* 3.79* 3.69* 3.45* 3.33* 3.14* 3.02* 2.89* 2.53*   < > 3.22* 3.17* 3.32* 3.01* 2.87* 3.05* 3.13* 3.00* 2.91* 3.49* 3.57*  CALCIUM 8.3* 7.9*   < > 7.9* 7.7* 7.7* 7.8* 7.9* 7.8* 8.1* 8.2* 8.2* 8.4* 8.4*   < > 7.8* 8.0* 8.0* 7.9* 8.1* 8.2* 8.0* 8.2* 7.9* 8.2* 7.8*  MG 1.9 1.9  --   --   --  1.9  --   --   --  1.7  --  1.6* 1.6*  --   --   --   --   --   --   --   --   --   --   --   --   --   PHOS  --   --   --  5.9* 6.1* 6.0*  5.9* 5.8* 5.6* 5.5* 4.7* 4.5  --   --   --   --   --   --   --   --   --   --   --   --   --   --   ALBUMIN  --   --   --  2.3* 2.0* 2.0* 2.1* 2.0* 1.9* 2.0* 2.0* 2.0*  --  2.6*  --  2.7* 2.5*  --   --   --   --   --   --  2.4* 3.0* 3.0*   < > = values in this interval not displayed.      Liver Function Tests: Recent Labs  Lab 03/25/22 0935 03/26/22 0500 03/27/22 0805  ALBUMIN 2.4* 3.0* 3.0*    No results for input(s): "LIPASE", "AMYLASE" in the last 168 hours. No results for input(s): "AMMONIA" in the last 168 hours. CBC: Recent Labs    10/20/21 0449 10/21/21 0234 03/18/22 0404 03/19/22 0754 03/20/22 1116 03/21/22 0751 03/27/22 0805  HGB  --    < > 7.4* 7.5* 7.9* 7.6* 7.2*  MCV  --    < > 84.1 83.8 82.7 82.9 83.0  VITAMINB12 439  --   --  321  --   --   --   FOLATE 6.9  --   --  9.3  --   --   --   FERRITIN 164  --   --  235  --   --   --   TIBC 227*  --   --  220*  --   --   --   IRON 12*  --   --  26*  --   --   --   RETICCTPCT 2.6  --   --  1.5  --   --   --    < > = values in this interval not displayed.     Cardiac Enzymes: No results for input(s): "CKTOTAL", "CKMB", "CKMBINDEX", "TROPONINI" in the last 168 hours. CBG: Recent Labs  Lab 03/26/22 2134 03/27/22 0720 03/27/22  1136 03/27/22 1709 03/27/22 2143  GLUCAP 148* 113* 145* 154* 135*     Iron Studies: No results for input(s): "IRON", "TIBC", "TRANSFERRIN", "FERRITIN" in the last 72 hours. Studies/Results: No results found.  Medications: Infusions:    Scheduled Medications:  amLODipine  10 mg Oral Daily   aspirin  81 mg Oral Daily   brimonidine  1 drop Both Eyes BID   carvedilol  12.5 mg Oral BID WC   dorzolamide  1 drop Both Eyes BID   heparin  5,000 Units Subcutaneous Q8H   hydrALAZINE  50 mg Oral Q8H   insulin aspart  0-5 Units Subcutaneous QHS   insulin aspart  0-9 Units Subcutaneous TID WC   insulin aspart  2 Units Subcutaneous TID WC   insulin glargine-yfgn  10 Units Subcutaneous  QHS   latanoprost  1 drop Both Eyes QHS   pantoprazole  40 mg Oral Daily   pregabalin  75 mg Oral Daily   sertraline  50 mg Oral QODAY   sodium bicarbonate  1,300 mg Oral BID   tamsulosin  0.8 mg Oral QPM    have reviewed scheduled and prn medications.  Physical Exam: General:NAD, comfortable Heart:RRR, s1s2 nl Lungs: Clear b/l, no crackle Abdomen:soft, Non-tender, non-distended Extremities: Lower extremity pitting edema, chronic skin changes, stump edema. Neurology: Alert awake and following commands.  Ekaterini Capitano Prasad Andra Matsuo 03/28/2022,7:18 AM  LOS: 10 days

## 2022-03-28 NOTE — Plan of Care (Signed)
  Problem: Coping: Goal: Ability to adjust to condition or change in health will improve Outcome: Progressing   Problem: Nutritional: Goal: Maintenance of adequate nutrition will improve Outcome: Adequate for Discharge   Problem: Skin Integrity: Goal: Risk for impaired skin integrity will decrease Outcome: Adequate for Discharge

## 2022-03-28 NOTE — Evaluation (Signed)
Occupational Therapy Evaluation Patient Details Name: [name] MRN: [id number] DOB: Jun 25, 1960 Today's Date: 03/28/2022   History of Present Illness 62 y.o. male past medical history of chronic diastolic heart failure, chronic kidney disease stage IV, diabetes mellitus type 2, peripheral vascular disease with a BKA brought in for shortness of breath, CT of the chest showed groundglass opacitie.  Patient was found to have pulmonary edema requiring diuresis.   Clinical Impression   [name] is a 62 year old man who presents with right sided hemiparesis, a left BKA and generalized weakness. He is a local SNF resident and he does not ambulate, He is hoyered to wheelchair and once in wheelchair can maneuver himself around. He does report he is able to use his RUE to propel his wheelchair. From a functional standpoint he is able to use his left arm to feed himself, perform grooming and assist with UB ADLs otherwise is dependent for care. He is at his baseline. He reports no increased weakness. He does report working with therapy currently at facility - OT and PT. Recommend return to facility and continue therapy there.      Recommendations for follow up therapy are one component of a multi-disciplinary discharge planning process, led by the attending physician.  Recommendations may be updated based on patient status, additional functional criteria and insurance authorization.   Follow Up Recommendations  Skilled nursing-short term rehab (<3 hours/day)     Assistance Recommended at Discharge Frequent or constant Supervision/Assistance  Patient can return home with the following A lot of help with walking and/or transfers;A lot of help with bathing/dressing/bathroom;Assistance with cooking/housework;Direct supervision/assist for medications management;Assist for transportation;Help with stairs or ramp for entrance;Direct supervision/assist for financial management    Functional Status  Assessment  Patient has not had a recent decline in their functional status  Equipment Recommendations  None recommended by OT    Recommendations for Other Services       Precautions / Restrictions Precautions Precautions: Fall Precaution Comments: Left BKA, R side hemiparesis Restrictions Weight Bearing Restrictions: No      Mobility Bed Mobility Overal bed mobility: Needs Assistance Bed Mobility: Rolling Rolling: Max assist              Transfers                   General transfer comment: Harrel Lemon for transfers is his baseline. Can manuever his wheelchair      Balance                                           ADL either performed or assessed with clinical judgement   ADL Overall ADL's : At baseline                                       General ADL Comments: Able to feed himself and assist with grooming and UB ADLs with one hand but other otherwise dependent for care. Reports he is at his baseline. Does report receiving therapy again at facility.     Vision Patient Visual Report: No change from baseline       Perception     Praxis      Pertinent Vitals/Pain Pain Assessment Pain Assessment: No/denies pain     Hand Dominance Right  Extremity/Trunk Assessment Upper Extremity Assessment Upper Extremity Assessment: RUE deficits/detail RUE Deficits / Details: Hemiparetic but exhibits gross ROM except for wrist that maintains in a flexed position but can be passively ranged to neutral, Grossly 3+/5 in wrist and elbow, wrist 3-/5, grip/figners 3-/5 RUE Sensation: decreased light touch RUE Coordination: decreased gross motor;decreased fine motor           Communication Communication Communication: No difficulties   Cognition Arousal/Alertness: Awake/alert Behavior During Therapy: WFL for tasks assessed/performed Overall Cognitive Status: Within Functional Limits for tasks assessed                                        General Comments       Exercises     Shoulder Instructions      Home Living Family/patient expects to be discharged to:: Skilled nursing facility                                 Additional Comments: resides at [facility]      Prior Functioning/Environment Prior Level of Function : Needs assist             Mobility Comments: Harrel Lemon to chair but reports he mobilizes using wc, says he can sit upright unassisted - has been receiving PT at SNF ADLs Comments: pt reports he is able to feed himself, but is otherwise dependent on staff for any ADLs - reports getting OT at SNF to help with his reach and that he uses his R arm to manage his wc        OT Problem List: Decreased strength;Decreased range of motion;Decreased activity tolerance;Impaired UE functional use      OT Treatment/Interventions:      OT Goals(Current goals can be found in the care plan section) Acute Rehab OT Goals OT Goal Formulation: All assessment and education complete, DC therapy  OT Frequency:      Co-evaluation              AM-PAC OT "6 Clicks" Daily Activity     Outcome Measure Help from another person eating meals?: A Little Help from another person taking care of personal grooming?: A Little Help from another person toileting, which includes using toliet, bedpan, or urinal?: Total Help from another person bathing (including washing, rinsing, drying)?: A Lot Help from another person to put on and taking off regular upper body clothing?: A Lot Help from another person to put on and taking off regular lower body clothing?: Total 6 Click Score: 12   End of Session Nurse Communication: Other (comment) (okay to see)  Activity Tolerance: Patient tolerated treatment well Patient left: in bed;with call bell/phone within reach;with bed alarm set  OT Visit Diagnosis: Hemiplegia and hemiparesis Hemiplegia - Right/Left: Right Hemiplegia -  dominant/non-dominant: Dominant Hemiplegia - caused by: Cerebral infarction                Time: 1051-1101 OT Time Calculation (min): 10 min Charges:  OT General Charges $OT Visit: 1 Visit OT Evaluation $OT Eval Low Complexity: 1 Low  Gustavo Lah, OTR/L [place]  Office [phone]   [name] 03/28/2022, 12:49 PM

## 2022-03-28 NOTE — Progress Notes (Signed)
PT Cancellation Note  Patient Details Name: JONMARC BODKIN MRN: [id number] DOB: [birth year]   Cancelled Treatment:    Reason Eval/Treat Not Completed: PT screened, no needs identified, will sign off Pt is long term care SNF resident.  Baseline mobility is OOB via lift to w/c.  OT has seen and evaluated pt and reports pt is at baseline.  Acute PT to sign off.   Myrtis Hopping [place] 03/28/2022, 2:21 PM Arlyce Dice, DPT Physical Therapist Acute Rehabilitation Services Preferred contact method: Secure Chat Weekend Pager Only: [phone] Office: [phone]

## 2022-03-28 NOTE — Progress Notes (Signed)
PROGRESS NOTE    [name]  ZHY:[id number] DOB: June 01, 1960 DOA: 03/17/2022 PCP: Jodi Marble, MD   Brief Narrative:   62 y.o. male past medical history of chronic diastolic heart failure, chronic kidney disease stage IV, diabetes mellitus type 2, peripheral vascular disease with a BKA brought in for shortness of breath, CT of the chest showed groundglass opacitie.  Patient was found to have pulmonary edema requiring diuresis which has been managed by nephrology team.   Assessment & Plan:  Principal Problem:   Acute on chronic diastolic CHF (congestive heart failure) ([place]) Active Problems:   Acute respiratory failure with hypoxia ([place])   Type 2 diabetes mellitus with stage 4 chronic kidney disease and hypertension (HCC)   Morbid (severe) obesity due to excess calories (HCC)   CKD (chronic kidney disease) stage 4, GFR 15-29 ml/min (HCC)   CVA (HCC) with residual right-sided weakness   History of left below knee amputation ([place])   Essential hypertension       Acute respiratory failure with hypoxia secondary to pulmonary edema/chronic diastolic heart failure: This is in setting of decompensated CHF.  Seen by nephrology team, initially received IV Lasix and albumin to help promote diuresis.  BNP 242.  Overall respiratory symptoms have improved.   Acute kidney injury chronic kidney disease stage IV Metabolic acidosis Creatinine is around 3.0-3.5.  Cont bicarbonate tablets.  Nephrology team following and guiding diuresis, at this time there is no indication for urgent dialysis.  Gentle hydration Renal US- medical dz.   Creatinine continues to rise, bladder scan showed urinary retention with greater than 500 cc.  Patient is refusing straight cath or Foley catheter placement.  I explained to him this can further worsen his kidney failure and he still does not want any further intervention.   Essential hypertension: On Norvasc, Coreg.  IV as needed   Chest pain:  Resolved Improved with GI cocktail   Peripheral vascular disease/diabetes mellitus type 2: With an A1c of 6.1. Long-acting.  Sliding scale Accu-Chek.  Adjust as necessary. Continue Lyrica   Normocytic anemia: Continue to monitor.  Received Aranesp   Leukopenia: Borderline we will need to follow-up as an outpatient with PCP.  PT/OT  DVT prophylaxis: Subcu heparin Code Status: Full code Family Communication:    Status is: Inpatient Remains inpatient appropriate because: Volume status appears to be better.  Awaiting nephrology clearance   Subjective: Seen and examined at bedside.  Did not have any complaints.  Examination: Constitutional: Not in acute distress Respiratory: Clear to auscultation bilaterally Cardiovascular: Normal sinus rhythm, no rubs Abdomen: Nontender nondistended good bowel sounds Musculoskeletal: Left-sided BKA Skin: Bilateral lower extreme chronic skin changes Neurologic: CN 2-12 grossly intact.  And nonfocal Psychiatric: Normal judgment and insight. Alert and oriented x 3. Normal mood.   External Foley catheter in place  Objective: Vitals:   03/27/22 1255 03/27/22 2148 03/28/22 0429 03/28/22 0500  BP: (!) 141/77 (!) 146/82 132/75   Pulse: 68 71 67   Resp: '18 20 17   '$ Temp: 97.8 F (36.6 C) 98.1 F (36.7 C) 97.9 F (36.6 C)   TempSrc: Oral Oral Oral   SpO2: 100% 100% 99%   Weight:    116.2 kg  Height:        Intake/Output Summary (Last 24 hours) at 03/28/2022 0837 Last data filed at 03/28/2022 0300 Gross per 24 hour  Intake 720 ml  Output 1050 ml  Net -330 ml   Filed Weights   03/26/22 0516 03/27/22  0522 03/28/22 0500  Weight: 116.8 kg 115.3 kg 116.2 kg     Data Reviewed:   CBC: Recent Labs  Lab 03/27/22 0805  WBC 3.2*  HGB 7.2*  HCT 23.4*  MCV 83.0  PLT 161   Basic Metabolic Panel: Recent Labs  Lab 03/23/22 0755 03/24/22 0534 03/25/22 0935 03/26/22 0500 03/27/22 0805  NA 137 137 134* 135 133*  K 3.5 3.3* 3.4* 3.8  3.4*  CL 106 105 105 105 104  CO2 '23 23 22 '$ 18* 20*  GLUCOSE 117* 101* 131* 115* 111*  BUN 64* 64* 76* 75* 87*  CREATININE 3.13* 3.00* 2.91* 3.49* 3.57*  CALCIUM 8.0* 8.2* 7.9* 8.2* 7.8*   GFR: Estimated Creatinine Clearance: 27.2 mL/min (A) (by C-G formula based on SCr of 3.57 mg/dL (H)). Liver Function Tests: Recent Labs  Lab 03/25/22 0935 03/26/22 0500 03/27/22 0805  ALBUMIN 2.4* 3.0* 3.0*   No results for input(s): "LIPASE", "AMYLASE" in the last 168 hours. No results for input(s): "AMMONIA" in the last 168 hours. Coagulation Profile: No results for input(s): "INR", "PROTIME" in the last 168 hours. Cardiac Enzymes: No results for input(s): "CKTOTAL", "CKMB", "CKMBINDEX", "TROPONINI" in the last 168 hours. BNP (last 3 results) No results for input(s): "PROBNP" in the last 8760 hours. HbA1C: No results for input(s): "HGBA1C" in the last 72 hours. CBG: Recent Labs  Lab 03/27/22 0720 03/27/22 1136 03/27/22 1709 03/27/22 2143 03/28/22 0728  GLUCAP 113* 145* 154* 135* 145*   Lipid Profile: No results for input(s): "CHOL", "HDL", "LDLCALC", "TRIG", "CHOLHDL", "LDLDIRECT" in the last 72 hours. Thyroid Function Tests: No results for input(s): "TSH", "T4TOTAL", "FREET4", "T3FREE", "THYROIDAB" in the last 72 hours. Anemia Panel: No results for input(s): "VITAMINB12", "FOLATE", "FERRITIN", "TIBC", "IRON", "RETICCTPCT" in the last 72 hours. Sepsis Labs: No results for input(s): "PROCALCITON", "LATICACIDVEN" in the last 168 hours.  No results found for this or any previous visit (from the past 240 hour(s)).        Radiology Studies: No results found.      Scheduled Meds:  amLODipine  10 mg Oral Daily   aspirin  81 mg Oral Daily   brimonidine  1 drop Both Eyes BID   carvedilol  12.5 mg Oral BID WC   dorzolamide  1 drop Both Eyes BID   heparin  5,000 Units Subcutaneous Q8H   hydrALAZINE  50 mg Oral Q8H   insulin aspart  0-5 Units Subcutaneous QHS   insulin  aspart  0-9 Units Subcutaneous TID WC   insulin aspart  2 Units Subcutaneous TID WC   insulin glargine-yfgn  10 Units Subcutaneous QHS   latanoprost  1 drop Both Eyes QHS   pantoprazole  40 mg Oral Daily   pregabalin  75 mg Oral Daily   sertraline  50 mg Oral QODAY   sodium bicarbonate  1,300 mg Oral BID   tamsulosin  0.8 mg Oral QPM   Continuous Infusions:   LOS: 10 days   Time spent= 35 mins    Ramanda Paules Arsenio Loader, MD [facility]  If 7PM-7AM, please contact night-coverage  03/28/2022, 8:37 AM

## 2022-03-28 NOTE — Progress Notes (Signed)
   03/28/22 1235  Provider Notification  Provider Name/Title Dr. Reesa Chew  Date Provider Notified 03/28/22  Time Provider Notified 1235  Method of Notification Page  Notification Reason Other (Comment) (pt refusing in and out catheter)  Provider response Evaluate remotely  Date of Provider Response 03/28/22  Time of Provider Response [id number]

## 2022-03-29 DIAGNOSIS — I5033 Acute on chronic diastolic (congestive) heart failure: Secondary | ICD-10-CM | POA: Diagnosis not present

## 2022-03-29 LAB — BASIC METABOLIC PANEL
Anion gap: 11 (ref 5–15)
BUN: 88 mg/dL — ABNORMAL HIGH (ref 6–20)
CO2: 20 mmol/L — ABNORMAL LOW (ref 22–32)
Calcium: 8.4 mg/dL — ABNORMAL LOW (ref 8.9–10.3)
Chloride: 108 mmol/L (ref 98–111)
Creatinine, Ser: 3.91 mg/dL — ABNORMAL HIGH (ref 0.61–1.24)
GFR, Estimated: 17 mL/min — ABNORMAL LOW (ref >60)
Glucose, Bld: 116 mg/dL — ABNORMAL HIGH (ref 70–99)
Potassium: 3.7 mmol/L (ref 3.5–5.1)
Sodium: 139 mmol/L (ref 135–145)

## 2022-03-29 LAB — GLUCOSE, CAPILLARY
Glucose-Capillary: 117 mg/dL — ABNORMAL HIGH (ref 70–99)
Glucose-Capillary: 130 mg/dL — ABNORMAL HIGH (ref 70–99)
Glucose-Capillary: 127 mg/dL — ABNORMAL HIGH (ref 70–99)
Glucose-Capillary: 150 mg/dL — ABNORMAL HIGH (ref 70–99)

## 2022-03-29 LAB — MAGNESIUM: Magnesium: 2.1 mg/dL (ref 1.7–2.4)

## 2022-03-29 NOTE — Progress Notes (Signed)
8270 Bladder scan showed 529 mL. Pt educated and refused in and out catheterization. MD made aware.  1230 Pt voided 400 into urine canister and small amount in bed. Bladder scan then showed 398. Educated about need for in and out cath again and refused again. MD made aware.

## 2022-03-29 NOTE — TOC Progression Note (Signed)
Transition of Care [facility]) - Progression Note    Patient Details  Name: [name] MRN: [id number] Date of Birth: 02/05/1961  Transition of [facility]) CM/SW Contact  Garren Greenman, Juliann Pulse, RN Phone Number: 03/29/2022, 2:22 PM  Clinical Narrative: d/c plan to return back to [place] aware.Nephrology following.     Expected Discharge Plan: Long Term Nursing Home Barriers to Discharge: Continued Medical Work up  Expected Discharge Plan and Services     Post Acute Care Choice: [place] Living arrangements for the past 2 months: [place]                                       Social Determinants of Health (SDOH) Interventions SDOH Screenings   Food Insecurity: No Food Insecurity (03/18/2022)  Housing: Low Risk  (03/18/2022)  Transportation Needs: No Transportation Needs (03/18/2022)  Utilities: Not At Risk (03/18/2022)  Alcohol Screen: Low Risk  (07/30/2017)  Depression (PHQ2-9): Low Risk  (07/10/2020)  Tobacco Use: Medium Risk (03/23/2022)    Readmission Risk Interventions    03/20/2022   12:20 PM 12/10/2021    8:41 AM 01/12/2020    3:39 PM  Readmission Risk Prevention Plan  Transportation Screening Complete Complete Complete  Palliative Care Screening   Not Applicable  Medication Review (RN Care Manager) Complete Complete   PCP or Specialist appointment within 3-5 days of discharge Complete Complete   HRI or Home Care Consult Complete Complete   SW Recovery Care/Counseling Consult Complete Complete   Palliative Care Screening Not Applicable Not Applicable   Skilled Nursing Facility Complete Complete

## 2022-03-29 NOTE — Progress Notes (Signed)
[facility] NEPHROLOGY PROGRESS NOTE  Assessment/ Plan: Pt is a 62 y.o. yo male with history of CHF, DM, peripheral vascular disease status post left BKA, stroke, HTN, BPH, CKD stage IV, presented with shortness of breath and hypoxia seen as a consultation for AKI on CKD.  # Acute kidney injury on CKD stage IV with baseline creatinine level 2.5-2.6.  AKI thought to be due to decompensated CHF with anasarca/pulm edema.  He was initially treated with IV diuretics however the BUN and creatinine level elevated.  The diuretics was held for few days and then received about a liter of fluid on 2/1.  Noted he has acute urinary retention but refusing Foley catheter.  Today the lab looks worsen this is probably exacerbated by persistent acute urinary retention.  Recommend Foley catheter insertion.  Holding both IV fluid and Lasix today.  The goal is to stabilize kidney function in the next few days and then possibly we can discharge him. He will ultimately needs furosemide to manage his volume once renal function stabilizes.  No urgent indication for dialysis however I discussed with the patient that it may be in near future.   # Hypokalemia: Replete potassium chloride.  Monitor lab.  # Hypertension: Blood pressure acceptable.  Continue current antihypertensive medication diuretics as above.  # Acute respiratory failure with hypoxia due to acute pulmonary edema/CHF.  Management as above.  Currently on room air.  # Metabolic acidosis: On sodium bicarbonate.  Monitor serum CO2 level.  # Anemia partly due to CKD.  Received IV iron twice.  Received Aranesp on 1/30.  Monitor CBC.  Transfuse as needed.  # Acute urinary retention: Recommend Foley catheter insertion.  Reported the bladder scan showed more than 500 cc of urine.  Discussed with primary team.  Subjective: I have seen and examined at bedside.  Urine output is recorded only 1.2 L.  Denies nausea, vomiting, chest pain, shortness of  breath. Objective Vital signs in last 24 hours: Vitals:   03/28/22 0500 03/28/22 1230 03/28/22 2058 03/29/22 0611  BP:  132/86 (!) 145/101 131/72  Pulse:  70 69 71  Resp:  '18 18 16  '$ Temp:  98.3 F (36.8 C) 98.1 F (36.7 C) 98.3 F (36.8 C)  TempSrc:  Oral Oral Oral  SpO2:  95% 100% 100%  Weight: 116.2 kg     Height:       Weight change:   Intake/Output Summary (Last 24 hours) at 03/29/2022 0750 Last data filed at 03/29/2022 0630 Gross per 24 hour  Intake 1135.68 ml  Output 1250 ml  Net -114.32 ml        Labs: RENAL PANEL Recent Labs    10/20/21 1259 10/21/21 0234 10/21/21 1501 10/23/21 0947 10/24/21 1051 10/25/21 0451 10/26/21 0905 10/27/21 0819 10/28/21 0659 10/29/21 0251 10/30/21 0220 10/31/21 1249 11/01/21 0952 12/08/21 1919 12/09/21 0526 03/17/22 1440 03/18/22 0404 03/19/22 0754 03/20/22 1116 03/21/22 0751 03/22/22 0956 03/23/22 0755 03/24/22 0534 03/25/22 0935 03/26/22 0500 03/27/22 0805 03/28/22 0800 03/29/22 0709  NA 140 139   < > '138 138 139 138 139 138 139 139 138 139 '$ 139   < > 134* 139   < > 138 139 138 137 137 134* 135 133* 137 139  K 5.0 4.6   < > 3.8 4.1 3.8 3.5 3.5 3.2* 3.0* 3.3* 3.3* 3.1* 5.1   < > 4.3 4.1   < > 4.1 3.7 3.5 3.5 3.3* 3.4* 3.8 3.4* 3.8 3.7  CL 117* 116*   < >  113* 113* 113* 112* 112* 111 109 105 106 106 115*   < > 112* 113*   < > '109 110 106 106 105 105 105 104 104 108 '$  CO2 16* 16*   < > 16* 17* 18* 18* 21* 19* 20* 22 21* 23 20*   < > 17* 20*   < > 22 21* '23 23 23 22 '$ 18* 20* 21* 20*  GLUCOSE 151* 124*   < > 144* 149* 138* 136* 130* 146* 130* 185* 167* 113* 126*   < > 171* 104*   < > 100* 136* 134* 117* 101* 131* 115* 111* 103* 116*  BUN 43* 45*   < > 48* 47* 47* 48* 49* 50* 48* 49* 53* 55* 39*   < > 57* 53*   < > 56* 58* 64* 64* 64* 76* 75* 87* 88* 88*  CREATININE 2.20* 2.19*   < > 3.37* 3.58* 3.74* 3.79* 3.69* 3.45* 3.33* 3.14* 3.02* 2.89* 2.53*   < > 3.22* 3.17*   < > 3.01* 2.87* 3.05* 3.13* 3.00* 2.91* 3.49* 3.57* 3.78*  3.91*  CALCIUM 8.3* 7.9*   < > 7.9* 7.7* 7.7* 7.8* 7.9* 7.8* 8.1* 8.2* 8.2* 8.4* 8.4*   < > 7.8* 8.0*   < > 7.9* 8.1* 8.2* 8.0* 8.2* 7.9* 8.2* 7.8* 8.3* 8.4*  MG 1.9 1.9  --   --   --  1.9  --   --   --  1.7  --  1.6* 1.6*  --   --   --   --   --   --   --   --   --   --   --   --   --  2.2 2.1  PHOS  --   --   --  5.9* 6.1* 6.0* 5.9* 5.8* 5.6* 5.5* 4.7* 4.5  --   --   --   --   --   --   --   --   --   --   --   --   --   --   --   --   ALBUMIN  --   --   --  2.3* 2.0* 2.0* 2.1* 2.0* 1.9* 2.0* 2.0* 2.0*  --  2.6*  --  2.7* 2.5*  --   --   --   --   --   --  2.4* 3.0* 3.0* 3.2*  --    < > = values in this interval not displayed.      Liver Function Tests: Recent Labs  Lab 03/26/22 0500 03/27/22 0805 03/28/22 0800  ALBUMIN 3.0* 3.0* 3.2*    No results for input(s): "LIPASE", "AMYLASE" in the last 168 hours. No results for input(s): "AMMONIA" in the last 168 hours. CBC: Recent Labs    10/20/21 0449 10/21/21 0234 03/18/22 0404 03/19/22 0754 03/20/22 1116 03/21/22 0751 03/27/22 0805  HGB  --    < > 7.4* 7.5* 7.9* 7.6* 7.2*  MCV  --    < > 84.1 83.8 82.7 82.9 83.0  VITAMINB12 439  --   --  321  --   --   --   FOLATE 6.9  --   --  9.3  --   --   --   FERRITIN 164  --   --  235  --   --   --   TIBC 227*  --   --  220*  --   --   --  IRON 12*  --   --  26*  --   --   --   RETICCTPCT 2.6  --   --  1.5  --   --   --    < > = values in this interval not displayed.     Cardiac Enzymes: No results for input(s): "CKTOTAL", "CKMB", "CKMBINDEX", "TROPONINI" in the last 168 hours. CBG: Recent Labs  Lab 03/27/22 2143 03/28/22 0728 03/28/22 1143 03/28/22 1643 03/28/22 2056  GLUCAP 135* 145* 119* 146* 123*     Iron Studies: No results for input(s): "IRON", "TIBC", "TRANSFERRIN", "FERRITIN" in the last 72 hours. Studies/Results: No results found.  Medications: Infusions:    Scheduled Medications:  amLODipine  10 mg Oral Daily   aspirin  81 mg Oral Daily   brimonidine  1  drop Both Eyes BID   carvedilol  12.5 mg Oral BID WC   dorzolamide  1 drop Both Eyes BID   heparin  5,000 Units Subcutaneous Q8H   hydrALAZINE  50 mg Oral Q8H   insulin aspart  0-5 Units Subcutaneous QHS   insulin aspart  0-9 Units Subcutaneous TID WC   insulin aspart  2 Units Subcutaneous TID WC   insulin glargine-yfgn  10 Units Subcutaneous QHS   latanoprost  1 drop Both Eyes QHS   pantoprazole  40 mg Oral Daily   pregabalin  75 mg Oral Daily   sertraline  50 mg Oral QODAY   sodium bicarbonate  1,300 mg Oral BID   tamsulosin  0.8 mg Oral QPM    have reviewed scheduled and prn medications.  Physical Exam: General:NAD, comfortable Heart:RRR, s1s2 nl Lungs: Clear b/l, no crackle Abdomen:soft, Non-tender, non-distended Extremities: Lower extremity pitting edema, chronic skin changes, trace stump edema. Neurology: Alert awake and following commands.  [name] 03/29/2022,7:50 AM  LOS: 11 days

## 2022-03-29 NOTE — Care Management Important Message (Signed)
Important Message  Patient Details IM Letter given. Name: [name] MRN: [id number] Date of Birth: Oct 01, 1960   Medicare Important Message Given:  Yes     Kerin Salen 03/29/2022, 10:50 AM

## 2022-03-29 NOTE — Progress Notes (Addendum)
PROGRESS NOTE    [name]  UMP:[id number] DOB: 02/02/1961 DOA: 03/17/2022 PCP: Jodi Marble, MD   Brief Narrative:   62 y.o. male past medical history of chronic diastolic heart failure, chronic kidney disease stage IV, diabetes mellitus type 2, peripheral vascular disease with a BKA brought in for shortness of breath, CT of the chest showed groundglass opacitie.  Patient was found to have pulmonary edema requiring diuresis which has been managed by nephrology team.  Patient is retaining quite a bit of urine but adamantly refusing any straight cath or Foley catheter placement despite of me explaining him the benefit of this.   Assessment & Plan:  Principal Problem:   Acute on chronic diastolic CHF (congestive heart failure) (HCC) Active Problems:   Acute respiratory failure with hypoxia (HCC)   Type 2 diabetes mellitus with stage 4 chronic kidney disease and hypertension (HCC)   Morbid (severe) obesity due to excess calories (HCC)   CKD (chronic kidney disease) stage 4, GFR 15-29 ml/min (HCC)   CVA (HCC) with residual right-sided weakness   History of left below knee amputation ([place])   Essential hypertension       Acute respiratory failure with hypoxia secondary to pulmonary edema/chronic diastolic heart failure: This is in setting of decompensated CHF.  Seen by nephrology team, initially received IV Lasix and albumin to help promote diuresis.  BNP 242.  Overall respiratory symptoms have improved.   Acute kidney injury chronic kidney disease stage IV Metabolic acidosis Creatinine is around 3.0-3.5.  Cont bicarbonate tablets.  Nephrology team following and guiding diuresis, at this time there is no indication for urgent dialysis.  Gentle hydration Renal US- medical dz.  BladderScan again this morning is showing significant urinary retention.  I explained to patient that his renal function continues to worsen daily and he is at risk of failure.  He continues to refuse straight  cath or Foley catheter placement.  He understands that this disease or renal failure can become irreversible. I spoke with his brother as well regarding the important of foley and he will speak with the patient.    Essential hypertension: On Norvasc, Coreg.  IV as needed   Chest pain: Resolved Improved with GI cocktail   Peripheral vascular disease/diabetes mellitus type 2: With an A1c of 6.1. Long-acting.  Sliding scale Accu-Chek.  Adjust as necessary. Continue Lyrica   Normocytic anemia: Continue to monitor.  Received Aranesp   Leukopenia: Borderline we will need to follow-up as an outpatient with PCP.  PT/OT  DVT prophylaxis: Subcu heparin Code Status: Full code Family Communication: Called his brother- [name].   Status is: Inpatient Remains inpatient appropriate because: Volume status appears to be better.  Awaiting nephrology clearance   Subjective: Seen and examined at bedside.  No complaints.  Adamantly refusing straight cath or Foley catheter placement despite of my extensive conversation with him.  Examination: Constitutional: Not in acute distress Respiratory: Clear to auscultation bilaterally Cardiovascular: Normal sinus rhythm, no rubs Abdomen: Nontender nondistended good bowel sounds Musculoskeletal: No edema noted Skin: Chronic skin changes Neurologic: CN 2-12 grossly intact.  And nonfocal Psychiatric: Normal judgment and insight. Alert and oriented x 3. Normal mood. Left-sided BKA External Foley catheter in place  Objective: Vitals:   03/28/22 2058 03/29/22 0611 03/29/22 0930 03/29/22 1226  BP: (!) 145/101 131/72 (!) 151/74 (!) 143/76  Pulse: 69 71  70  Resp: '18 16  18  '$ Temp: 98.1 F (36.7 C) 98.3 F (36.8 C)  98.6 F (  37 C)  TempSrc: Oral Oral  Oral  SpO2: 100% 100%  100%  Weight:      Height:        Intake/Output Summary (Last 24 hours) at 03/29/2022 1228 Last data filed at 03/29/2022 0940 Gross per 24 hour  Intake 1133.68 ml  Output 800 ml   Net 333.68 ml   Filed Weights   03/26/22 0516 03/27/22 0522 03/28/22 0500  Weight: 116.8 kg 115.3 kg 116.2 kg     Data Reviewed:   CBC: Recent Labs  Lab 03/27/22 0805  WBC 3.2*  HGB 7.2*  HCT 23.4*  MCV 83.0  PLT 676   Basic Metabolic Panel: Recent Labs  Lab 03/25/22 0935 03/26/22 0500 03/27/22 0805 03/28/22 0800 03/29/22 0709  NA 134* 135 133* 137 139  K 3.4* 3.8 3.4* 3.8 3.7  CL 105 105 104 104 108  CO2 22 18* 20* 21* 20*  GLUCOSE 131* 115* 111* 103* 116*  BUN 76* 75* 87* 88* 88*  CREATININE 2.91* 3.49* 3.57* 3.78* 3.91*  CALCIUM 7.9* 8.2* 7.8* 8.3* 8.4*  MG  --   --   --  2.2 2.1   GFR: Estimated Creatinine Clearance: 24.9 mL/min (A) (by C-G formula based on SCr of 3.91 mg/dL (H)). Liver Function Tests: Recent Labs  Lab 03/25/22 0935 03/26/22 0500 03/27/22 0805 03/28/22 0800  ALBUMIN 2.4* 3.0* 3.0* 3.2*   No results for input(s): "LIPASE", "AMYLASE" in the last 168 hours. No results for input(s): "AMMONIA" in the last 168 hours. Coagulation Profile: No results for input(s): "INR", "PROTIME" in the last 168 hours. Cardiac Enzymes: No results for input(s): "CKTOTAL", "CKMB", "CKMBINDEX", "TROPONINI" in the last 168 hours. BNP (last 3 results) No results for input(s): "PROBNP" in the last 8760 hours. HbA1C: No results for input(s): "HGBA1C" in the last 72 hours. CBG: Recent Labs  Lab 03/28/22 1143 03/28/22 1643 03/28/22 2056 03/29/22 0809 03/29/22 1151  GLUCAP 119* 146* 123* 117* 130*   Lipid Profile: No results for input(s): "CHOL", "HDL", "LDLCALC", "TRIG", "CHOLHDL", "LDLDIRECT" in the last 72 hours. Thyroid Function Tests: No results for input(s): "TSH", "T4TOTAL", "FREET4", "T3FREE", "THYROIDAB" in the last 72 hours. Anemia Panel: No results for input(s): "VITAMINB12", "FOLATE", "FERRITIN", "TIBC", "IRON", "RETICCTPCT" in the last 72 hours. Sepsis Labs: No results for input(s): "PROCALCITON", "LATICACIDVEN" in the last 168  hours.  No results found for this or any previous visit (from the past 240 hour(s)).        Radiology Studies: No results found.      Scheduled Meds:  amLODipine  10 mg Oral Daily   aspirin  81 mg Oral Daily   brimonidine  1 drop Both Eyes BID   carvedilol  12.5 mg Oral BID WC   dorzolamide  1 drop Both Eyes BID   heparin  5,000 Units Subcutaneous Q8H   hydrALAZINE  50 mg Oral Q8H   insulin aspart  0-5 Units Subcutaneous QHS   insulin aspart  0-9 Units Subcutaneous TID WC   insulin aspart  2 Units Subcutaneous TID WC   insulin glargine-yfgn  10 Units Subcutaneous QHS   latanoprost  1 drop Both Eyes QHS   pantoprazole  40 mg Oral Daily   pregabalin  75 mg Oral Daily   sertraline  50 mg Oral QODAY   sodium bicarbonate  1,300 mg Oral BID   tamsulosin  0.8 mg Oral QPM   Continuous Infusions:   LOS: 11 days   Time spent= 35 mins  Cortasia Screws Arsenio Loader, MD [facility]  If 7PM-7AM, please contact night-coverage  03/29/2022, 12:28 PM

## 2022-03-29 NOTE — Progress Notes (Signed)
Received report from offgoing nurse. Agree with previous assessment. Pt in bed, call light within reach, bed alarmed engaged.

## 2022-03-30 DIAGNOSIS — N184 Chronic kidney disease, stage 4 (severe): Secondary | ICD-10-CM | POA: Diagnosis not present

## 2022-03-30 DIAGNOSIS — Z7189 Other specified counseling: Secondary | ICD-10-CM

## 2022-03-30 DIAGNOSIS — J9601 Acute respiratory failure with hypoxia: Secondary | ICD-10-CM | POA: Diagnosis not present

## 2022-03-30 DIAGNOSIS — I5033 Acute on chronic diastolic (congestive) heart failure: Secondary | ICD-10-CM | POA: Diagnosis not present

## 2022-03-30 DIAGNOSIS — Z515 Encounter for palliative care: Secondary | ICD-10-CM | POA: Diagnosis not present

## 2022-03-30 DIAGNOSIS — Z8673 Personal history of transient ischemic attack (TIA), and cerebral infarction without residual deficits: Secondary | ICD-10-CM

## 2022-03-30 DIAGNOSIS — R4589 Other symptoms and signs involving emotional state: Secondary | ICD-10-CM

## 2022-03-30 LAB — BASIC METABOLIC PANEL
Anion gap: 11 (ref 5–15)
BUN: 94 mg/dL — ABNORMAL HIGH (ref 6–20)
CO2: 20 mmol/L — ABNORMAL LOW (ref 22–32)
Calcium: 8.2 mg/dL — ABNORMAL LOW (ref 8.9–10.3)
Chloride: 108 mmol/L (ref 98–111)
Creatinine, Ser: 4.12 mg/dL — ABNORMAL HIGH (ref 0.61–1.24)
GFR, Estimated: 16 mL/min — ABNORMAL LOW (ref >60)
Glucose, Bld: 137 mg/dL — ABNORMAL HIGH (ref 70–99)
Potassium: 3.5 mmol/L (ref 3.5–5.1)
Sodium: 139 mmol/L (ref 135–145)

## 2022-03-30 LAB — CBC
HCT: 24.7 % — ABNORMAL LOW (ref 39.0–52.0)
Hemoglobin: 7.4 g/dL — ABNORMAL LOW (ref 13.0–17.0)
MCH: 25.8 pg — ABNORMAL LOW (ref 26.0–34.0)
MCHC: 30 g/dL (ref 30.0–36.0)
MCV: 86.1 fL (ref 80.0–100.0)
Platelets: 203 10*3/uL (ref 150–400)
RBC: 2.87 MIL/uL — ABNORMAL LOW (ref 4.22–5.81)
RDW: 17.4 % — ABNORMAL HIGH (ref 11.5–15.5)
WBC: 3.9 10*3/uL — ABNORMAL LOW (ref 4.0–10.5)
nRBC: 0 % (ref 0.0–0.2)

## 2022-03-30 LAB — PREPARE RBC (CROSSMATCH)

## 2022-03-30 LAB — GLUCOSE, CAPILLARY
Glucose-Capillary: 131 mg/dL — ABNORMAL HIGH (ref 70–99)
Glucose-Capillary: 129 mg/dL — ABNORMAL HIGH (ref 70–99)
Glucose-Capillary: 130 mg/dL — ABNORMAL HIGH (ref 70–99)
Glucose-Capillary: 142 mg/dL — ABNORMAL HIGH (ref 70–99)

## 2022-03-30 LAB — MAGNESIUM: Magnesium: 2 mg/dL (ref 1.7–2.4)

## 2022-03-30 MED ORDER — SODIUM CHLORIDE 0.9% IV SOLUTION: Freq: Once | INTRAVENOUS | Status: AC

## 2022-03-30 NOTE — Progress Notes (Signed)
PROGRESS NOTE    [name]  CLE:[id number] DOB: 1961-02-09 DOA: 03/17/2022 PCP: Jodi Marble, MD   Brief Narrative:   62 y.o. male past medical history of chronic diastolic heart failure, chronic kidney disease stage IV, diabetes mellitus type 2, peripheral vascular disease with a BKA brought in for shortness of breath, CT of the chest showed groundglass opacitie.  Patient was found to have pulmonary edema requiring diuresis which has been managed by nephrology team.  Patient is retaining quite a bit of urine but adamantly refusing any straight cath or Foley catheter placement despite of me explaining him the benefit of this.  At this time patient's kidney continues to worsen, he has been seen by palliative care service as well.   Assessment & Plan:  Principal Problem:   Acute on chronic diastolic CHF (congestive heart failure) (HCC) Active Problems:   Acute respiratory failure with hypoxia (HCC)   Type 2 diabetes mellitus with stage 4 chronic kidney disease and hypertension (HCC)   Morbid (severe) obesity due to excess calories (HCC)   CKD (chronic kidney disease) stage 4, GFR 15-29 ml/min (HCC)   CVA (HCC) with residual right-sided weakness   History of left below knee amputation ([place])   Essential hypertension       Acute respiratory failure with hypoxia secondary to pulmonary edema/chronic diastolic heart failure: Initially required diuresis.  Nephrology is following.   Acute kidney injury chronic kidney disease stage IV Metabolic acidosis Uremia Creatinine is around 3.0-3.5.  Cont bicarbonate tablets.  Nephrology team following and guiding diuresis, at this time there is no indication for urgent dialysis.  Gentle hydration Renal US- medical dz.  Patient continues to have significant urinary retention for at least last 3 days.  During this time I have had multiple conversations with him and his brother.  Nephrology and palliative care service also spoke with him.  Patient  is adamant and refusing straight catheterization and Foley catheter.  I have even offered him pain medication to help with his pain but patient continues to decline this.  He understands if this continues to worsen his kidneys will fail and eventually requiring dialysis.   Essential hypertension: On Norvasc, Coreg.  IV as needed   Chest pain: Resolved Improved with GI cocktail   Peripheral vascular disease/diabetes mellitus type 2: With an A1c of 6.1. Long-acting.  Sliding scale Accu-Chek.  Adjust as necessary. Continue Lyrica   Normocytic anemia: Continue to monitor.  Received Aranesp.  Hemoglobin 7.4, 1 unit PRBC transfusion   Leukopenia: Borderline we will need to follow-up as an outpatient with PCP.  Given patient's advanced medical condition, acute and chronic comorbidities and his resistance to medical care, palliative care team consulted.  Appreciate their input.  PT/OT  DVT prophylaxis: Subcu heparin Code Status: Full code Family Communication:   Status is: Inpatient Remains inpatient appropriate because: Volume status appears to be better.  Awaiting nephrology clearance   Subjective: Seen and examined at bedside.  RN present in the room as well with me during my interaction with the patient.  Patient states he feels okay but does not like Foley catheter/straight catheter insertion in his penis and does not want periodic blood draws.  Later he did allow blood draw.  My extensive explanation to him regarding the necessity to relieve pressure from his kidneys by performing straight cath or Foley catheter placement, he continues to refuse this.  He understands that this can lead to permanent renal failure, multiple electrolyte derangements and increasing  volume overload eventually leading to death.  Examination: Constitutional: Not in acute distress Respiratory: Clear to auscultation bilaterally Cardiovascular: Normal sinus rhythm, no rubs Abdomen: Nontender nondistended  good bowel sounds Musculoskeletal: No edema noted Skin: No rashes seen Neurologic: CN 2-12 grossly intact.  And nonfocal Psychiatric: Normal judgment and insight. Alert and oriented x 3. Normal mood. Left-sided BKA External Foley catheter in place  Objective: Vitals:   03/29/22 2016 03/30/22 0452 03/30/22 0931 03/30/22 1310  BP: 129/75 (!) 141/78 (!) 143/88 (!) 107/57  Pulse: 72 73 73 71  Resp: '19 19 18 18  '$ Temp: 97.8 F (36.6 C) 98 F (36.7 C)  98.3 F (36.8 C)  TempSrc: Oral Oral  Oral  SpO2: 100% 97% 98% 100%  Weight:  104.2 kg    Height:        Intake/Output Summary (Last 24 hours) at 03/30/2022 1345 Last data filed at 03/30/2022 1235 Gross per 24 hour  Intake 800 ml  Output 1100 ml  Net -300 ml   Filed Weights   03/27/22 0522 03/28/22 0500 03/30/22 0452  Weight: 115.3 kg 116.2 kg 104.2 kg     Data Reviewed:   CBC: Recent Labs  Lab 03/27/22 0805 03/30/22 1152  WBC 3.2* 3.9*  HGB 7.2* 7.4*  HCT 23.4* 24.7*  MCV 83.0 86.1  PLT 184 094   Basic Metabolic Panel: Recent Labs  Lab 03/26/22 0500 03/27/22 0805 03/28/22 0800 03/29/22 0709 03/30/22 1152  NA 135 133* 137 139 139  K 3.8 3.4* 3.8 3.7 3.5  CL 105 104 104 108 108  CO2 18* 20* 21* 20* 20*  GLUCOSE 115* 111* 103* 116* 137*  BUN 75* 87* 88* 88* 94*  CREATININE 3.49* 3.57* 3.78* 3.91* 4.12*  CALCIUM 8.2* 7.8* 8.3* 8.4* 8.2*  MG  --   --  2.2 2.1 2.0   GFR: Estimated Creatinine Clearance: 22.3 mL/min (A) (by C-G formula based on SCr of 4.12 mg/dL (H)). Liver Function Tests: Recent Labs  Lab 03/25/22 0935 03/26/22 0500 03/27/22 0805 03/28/22 0800  ALBUMIN 2.4* 3.0* 3.0* 3.2*   No results for input(s): "LIPASE", "AMYLASE" in the last 168 hours. No results for input(s): "AMMONIA" in the last 168 hours. Coagulation Profile: No results for input(s): "INR", "PROTIME" in the last 168 hours. Cardiac Enzymes: No results for input(s): "CKTOTAL", "CKMB", "CKMBINDEX", "TROPONINI" in the last 168  hours. BNP (last 3 results) No results for input(s): "PROBNP" in the last 8760 hours. HbA1C: No results for input(s): "HGBA1C" in the last 72 hours. CBG: Recent Labs  Lab 03/29/22 1151 03/29/22 1640 03/29/22 2048 03/30/22 0723 03/30/22 1149  GLUCAP 130* 127* 150* 131* 129*   Lipid Profile: No results for input(s): "CHOL", "HDL", "LDLCALC", "TRIG", "CHOLHDL", "LDLDIRECT" in the last 72 hours. Thyroid Function Tests: No results for input(s): "TSH", "T4TOTAL", "FREET4", "T3FREE", "THYROIDAB" in the last 72 hours. Anemia Panel: No results for input(s): "VITAMINB12", "FOLATE", "FERRITIN", "TIBC", "IRON", "RETICCTPCT" in the last 72 hours. Sepsis Labs: No results for input(s): "PROCALCITON", "LATICACIDVEN" in the last 168 hours.  No results found for this or any previous visit (from the past 240 hour(s)).        Radiology Studies: No results found.      Scheduled Meds:  sodium chloride   Intravenous Once   amLODipine  10 mg Oral Daily   aspirin  81 mg Oral Daily   brimonidine  1 drop Both Eyes BID   carvedilol  12.5 mg Oral BID WC   dorzolamide  1 drop Both Eyes BID   heparin  5,000 Units Subcutaneous Q8H   hydrALAZINE  50 mg Oral Q8H   insulin aspart  0-5 Units Subcutaneous QHS   insulin aspart  0-9 Units Subcutaneous TID WC   insulin aspart  2 Units Subcutaneous TID WC   insulin glargine-yfgn  10 Units Subcutaneous QHS   latanoprost  1 drop Both Eyes QHS   pantoprazole  40 mg Oral Daily   pregabalin  75 mg Oral Daily   sertraline  50 mg Oral QODAY   sodium bicarbonate  1,300 mg Oral BID   tamsulosin  0.8 mg Oral QPM   Continuous Infusions:   LOS: 12 days   Time spent= 35 mins    [name] Arsenio Loader, MD [facility]  If 7PM-7AM, please contact night-coverage  03/30/2022, 1:45 PM

## 2022-03-30 NOTE — Progress Notes (Signed)
Daily Progress Note   Patient Name: [name]       Date: 03/30/2022 DOB: 18-Nov-1960  Age: 62 y.o. MRN#: [id number] Attending Physician: Damita Lack, MD Primary Care Physician: Jodi Marble, MD Admit Date: 03/17/2022 Length of Stay: 12 days  Reason for Consultation/Follow-up: Establishing goals of care  Subjective:   CC: Patient noted concern about pain associated with Foley placement.  Following up regarding complex medical decision making.  Subjective:  Patient last seen by palliative medicine provider Elie Confer, NP on 03/19/22.  Discussed care with hospitalist today.  Being re involved due to patient's worsening kidney function.  Extensive review of EMR prior to seeing patient.  Presented to bedside and introduced myself as a provider on the palliative medicine team.  Again introduced our role in patient's care.  Patient seen laying in bed enjoying frozen ice.  Patient able to join in conversation and update me regarding his current medical situation.  Patient has heard from multiple providers that his kidney function is worsening.  Patient states "I am peeing though" and so he feels his kidney function should be getting better.  Again discussed that despite being, renal function can continue to deteriorate.  Patient has heard he needs a Foley placed though states that is incredibly painful and so he is not wanting to have that done.  Acknowledges this and inquired if we could discuss potential pathways for medical care moving forward which patient agreed with.  Discussed 1 pathway for medical care included continuing with aggressive medical management.  This would include having Foley placed, continuing lab work, and continuing further workup for his worsening renal function.  Patient stating that even if he has the newly placed, what is to say his kidney function will continue to deteriorate anyway.  Acknowledged this and that it could.  Patient noted fear about ending up  on dialysis.  His brother went through dialysis and got a kidney transplant a couple years ago.  Discussed that patient very well could end up on dialysis and again patient has a say and whether he would want to go through that procedure or not.  Discussed second pathway for medical care included instead focusing on patient's comfort at the end of life.  Discussed patient's kidney status could continue to worsen which would lead to confusion, lethargy, and eventually death.  Patient acknowledged that he has been hearing this.  Patient knows that he has lots of medical illnesses and so has been debating if this would be a more reasonable pathway for him.  Spent time providing emotional support for patient.  Discussed patient's overarching goals for medical care.  Patient again stated that his goal is to attend his father's 91st birthday in June.  Expressed great worry that based on all his current medical conditions and if they continue to worsen, patient will die before then.  Patient acknowledged hearing this.  Patient still debating interventions he would want done.  Inquired with patient who best supports him during these difficult decision making times and he mentioned his brother.  Offered to call his brother while in the room with patient and patient did not want this.  Patient wanted to speak to brother separately a little later on.  Acknowledged patient's wishes and also that as time goes by without him making a decision, his body is continuing to deteriorate so time is of the essence regarding his decisions has something to do cannot "come back" from.  With permission, also able  to address patient's CODE STATUS.  Discussed with patient that with his current medical illnesses and him reluctant to have further medical interventions, full CODE STATUS is not appropriate.  Took time to explain the difference between full code and DNR.  Expressed worry that should patient be sick enough that his heart  physically stops or he stops breathing, interventions such as CPR and later support are not going to allow him to return to a quality of life he would see if it.  Noted 1 can be DNR and continue appropriate medical therapies at the same time which means continued IV fluids and such, just not receiving CPR or mechanical intubation if he so sick his heart stops or he stops breathing.  Patient acknowledged hearing this as well and wanted some time to think about it.  All questions answered at that time.  Thanked patient for allowing this provider to visit today.  Noted would continue to follow along support conversations moving forward.  Updated hospitalist regarding conversation with patient today.   Objective:   Vital Signs:  BP 135/74 (BP Location: Left Arm)   Pulse 69   Temp 98.6 F (37 C) (Oral)   Resp 16   Ht '5\' 8"'$  (1.727 m)   Wt 104.2 kg   SpO2 100%   BMI 34.93 kg/m   Physical Exam: General: NAD, alert, chronically ill appearing Eyes: no drainage noted HENT:  moist mucous membranes Cardiovascular: RRR, no edema in LE b/l Respiratory: no increased work of breathing noted, not in respiratory distress Abdomen: not distended Extremities: L BKA Skin: no rashes or lesions on visible skin Neuro: A&Ox4, following commands easily Psych: appropriately answers all questions  Imaging:  I personally reviewed recent imaging.   Assessment & Plan:   Assessment:  Patient is a 62 year old male with a past medical history of chronic diastolic heart failure, chronic kidney disease stage IV, diabetes mellitus type 2, CVA with right sided residual effects, and peripheral vascular disease status post left BKA who was admitted on 03/17/2022 for management of shortness of breath.  Imaging upon admission showed groundglass opacities.  Patient was found to have pulmonary edema requiring diuresis managed by nephrology.  Patient has had complications of worsening renal status in the setting and urinary  retention.  Palliative medicine team consulted to assist with complex medical decision making.  Recommendations/Plan: # Complex medical decision making/goals of care:  - Extensive conversation with patient at bedside today as described in detail above in HPI.  Patient voicing wanting to be able to attend his father's 91st birthday in June though has been refusing multiple appropriate medical interventions.  Discussed pathways for medical care with patient moving forward including aggressive medical management versus transitioning to comfort focused care.  Patient considering these options and plans to discuss with his brother.  Did emphasize importance of time not being on patient's side to make decisions so encouraged to continue conversations as soon as able.  -  Code Status: Full Code   -Discussed with patient worried that due to all his chronic medical condition should he be sick enough that his heart stop or he stops breathing, interventions such as chest compressions mechanical ventilation would not allow him to return to the quality of life he would see fit.  Recommended DNR status.  Noted patient can continue with appropriate medical interventions with DNR status in place.  Patient to further consider this.  # Symptom management:  -As per primary team  # Psychosocial Support:  -Brother-  didn't want me to call today  # Discharge Planning: TBD  Discussed with: patient, hospitalist  Thank you for allowing the palliative care team to participate in the care [name].  Chelsea Aus, DO Palliative Care Provider PMT # [phone]  If patient remains symptomatic despite maximum doses, please call PMT at [phone] between 0700 and 1900. Outside of these hours, please call attending, as PMT does not have night coverage.  This provider spent a total of 56 minutes providing patient's care.  Includes review of EMR, discussing care with other staff members involved in patient's medical  care, obtaining relevant history and information from patient and/or patient's family, and personal review of imaging and lab work. Greater than 50% of the time was spent counseling and coordinating care related to the above assessment and plan.

## 2022-03-30 NOTE — Progress Notes (Signed)
[facility] NEPHROLOGY PROGRESS NOTE  Assessment/ Plan: Pt is a 62 y.o. yo male with history of CHF, DM, peripheral vascular disease status post left BKA, stroke, HTN, BPH, CKD stage IV, presented with shortness of breath and hypoxia seen as a consultation for AKI on CKD.  # Acute kidney injury on CKD stage IV with baseline creatinine level 2.5-2.6.  AKI thought to be due to decompensated CHF with anasarca/pulm edema.  He was initially treated with IV diuretics which was later discontinued because of continue rising BUN and creatinine level.  Also noted acute urinary retention for which he is refusing intraurethral Foley catheter.  The lab from 2/2 looks worsening GFR.  If the lab continue to worsen today then I may challenge him with gentle IV hydration.  The goal is to stabilize kidney function so that we can discharge him with outpatient follow-up.  He will ultimately need diuretics to manage edema and I am worried that he may be heading towards requiring dialysis soon.  In that case he may need to transfer to [facility].  There is no urgent indication to start dialysis at the moment.  I will continue to follow closely.   # Acute urinary retention: I strongly encouraged him to have Foley catheter.  Patient is adamant about not having intraurethral catheter.  He understands that he may be heading toward requiring dialysis if no improvement in renal function.  # Hypokalemia: Replete potassium chloride.  Monitor lab.  # Hypertension: Blood pressure acceptable.  Continue current antihypertensive medication diuretics as above.  # Acute respiratory failure with hypoxia due to acute pulmonary edema/CHF.  Management as above.  Currently on room air.  # Metabolic acidosis: Continue sodium bicarbonate.  Monitor serum CO2 level.  # Anemia partly due to CKD.  Received IV iron twice.  Received Aranesp on 1/30.  Given cardiac patient with persistent low hemoglobin, I recommend transfusing a  unit of blood.  I will discuss with the primary team.  Subjective: I have seen and examined at bedside.  Quite somnolent today.  Patient reported that it was too early to wake up.  Noted he has been refusing insertion of Foley catheter with bladder scan showing acute urinary retention.  The urine output is recorded around 850 cc.  He is on room air and denies any new complaint or concern.  No lab results available from this morning.  Objective Vital signs in last 24 hours: Vitals:   03/29/22 0930 03/29/22 1226 03/29/22 2016 03/30/22 0452  BP: (!) 151/74 (!) 143/76 129/75 (!) 141/78  Pulse:  70 72 73  Resp:  '18 19 19  '$ Temp:  98.6 F (37 C) 97.8 F (36.6 C) 98 F (36.7 C)  TempSrc:  Oral Oral Oral  SpO2:  100% 100% 97%  Weight:    104.2 kg  Height:       Weight change:   Intake/Output Summary (Last 24 hours) at 03/30/2022 0812 Last data filed at 03/30/2022 0400 Gross per 24 hour  Intake 718 ml  Output 850 ml  Net -132 ml        Labs: RENAL PANEL Recent Labs    10/20/21 1259 10/21/21 0234 10/21/21 1501 10/23/21 0947 10/24/21 1051 10/25/21 0451 10/26/21 0905 10/27/21 8119 10/28/21 1478 10/29/21 0251 10/30/21 0220 10/31/21 1249 11/01/21 2956 12/08/21 1919 12/09/21 0526 03/17/22 1440 03/18/22 0404 03/19/22 0754 03/20/22 1116 03/21/22 0751 03/22/22 2130 03/23/22 0755 03/24/22 0534 03/25/22 0935 03/26/22 0500 03/27/22 0805 03/28/22 0800 03/29/22 8657  NA 140 139   < > '138 138 139 138 139 138 139 139 138 139 '$ 139   < > 134* 139   < > 138 139 138 137 137 134* 135 133* 137 139  K 5.0 4.6   < > 3.8 4.1 3.8 3.5 3.5 3.2* 3.0* 3.3* 3.3* 3.1* 5.1   < > 4.3 4.1   < > 4.1 3.7 3.5 3.5 3.3* 3.4* 3.8 3.4* 3.8 3.7  CL 117* 116*   < > 113* 113* 113* 112* 112* 111 109 105 106 106 115*   < > 112* 113*   < > '109 110 106 106 105 105 105 104 104 108 '$  CO2 16* 16*   < > 16* 17* 18* 18* 21* 19* 20* 22 21* 23 20*   < > 17* 20*   < > 22 21* '23 23 23 22 '$ 18* 20* 21* 20*  GLUCOSE 151*  124*   < > 144* 149* 138* 136* 130* 146* 130* 185* 167* 113* 126*   < > 171* 104*   < > 100* 136* 134* 117* 101* 131* 115* 111* 103* 116*  BUN 43* 45*   < > 48* 47* 47* 48* 49* 50* 48* 49* 53* 55* 39*   < > 57* 53*   < > 56* 58* 64* 64* 64* 76* 75* 87* 88* 88*  CREATININE 2.20* 2.19*   < > 3.37* 3.58* 3.74* 3.79* 3.69* 3.45* 3.33* 3.14* 3.02* 2.89* 2.53*   < > 3.22* 3.17*   < > 3.01* 2.87* 3.05* 3.13* 3.00* 2.91* 3.49* 3.57* 3.78* 3.91*  CALCIUM 8.3* 7.9*   < > 7.9* 7.7* 7.7* 7.8* 7.9* 7.8* 8.1* 8.2* 8.2* 8.4* 8.4*   < > 7.8* 8.0*   < > 7.9* 8.1* 8.2* 8.0* 8.2* 7.9* 8.2* 7.8* 8.3* 8.4*  MG 1.9 1.9  --   --   --  1.9  --   --   --  1.7  --  1.6* 1.6*  --   --   --   --   --   --   --   --   --   --   --   --   --  2.2 2.1  PHOS  --   --   --  5.9* 6.1* 6.0* 5.9* 5.8* 5.6* 5.5* 4.7* 4.5  --   --   --   --   --   --   --   --   --   --   --   --   --   --   --   --   ALBUMIN  --   --   --  2.3* 2.0* 2.0* 2.1* 2.0* 1.9* 2.0* 2.0* 2.0*  --  2.6*  --  2.7* 2.5*  --   --   --   --   --   --  2.4* 3.0* 3.0* 3.2*  --    < > = values in this interval not displayed.      Liver Function Tests: Recent Labs  Lab 03/26/22 0500 03/27/22 0805 03/28/22 0800  ALBUMIN 3.0* 3.0* 3.2*    No results for input(s): "LIPASE", "AMYLASE" in the last 168 hours. No results for input(s): "AMMONIA" in the last 168 hours. CBC: Recent Labs    10/20/21 0449 10/21/21 0234 03/18/22 0404 03/19/22 0754 03/20/22 1116 03/21/22 0751 03/27/22 0805  HGB  --    < > 7.4* 7.5* 7.9* 7.6* 7.2*  MCV  --    < >  84.1 83.8 82.7 82.9 83.0  VITAMINB12 439  --   --  321  --   --   --   FOLATE 6.9  --   --  9.3  --   --   --   FERRITIN 164  --   --  235  --   --   --   TIBC 227*  --   --  220*  --   --   --   IRON 12*  --   --  26*  --   --   --   RETICCTPCT 2.6  --   --  1.5  --   --   --    < > = values in this interval not displayed.     Cardiac Enzymes: No results for input(s): "CKTOTAL", "CKMB", "CKMBINDEX", "TROPONINI" in  the last 168 hours. CBG: Recent Labs  Lab 03/29/22 0809 03/29/22 1151 03/29/22 1640 03/29/22 2048 03/30/22 0723  GLUCAP 117* 130* 127* 150* 131*     Iron Studies: No results for input(s): "IRON", "TIBC", "TRANSFERRIN", "FERRITIN" in the last 72 hours. Studies/Results: No results found.  Medications: Infusions:    Scheduled Medications:  amLODipine  10 mg Oral Daily   aspirin  81 mg Oral Daily   brimonidine  1 drop Both Eyes BID   carvedilol  12.5 mg Oral BID WC   dorzolamide  1 drop Both Eyes BID   heparin  5,000 Units Subcutaneous Q8H   hydrALAZINE  50 mg Oral Q8H   insulin aspart  0-5 Units Subcutaneous QHS   insulin aspart  0-9 Units Subcutaneous TID WC   insulin aspart  2 Units Subcutaneous TID WC   insulin glargine-yfgn  10 Units Subcutaneous QHS   latanoprost  1 drop Both Eyes QHS   pantoprazole  40 mg Oral Daily   pregabalin  75 mg Oral Daily   sertraline  50 mg Oral QODAY   sodium bicarbonate  1,300 mg Oral BID   tamsulosin  0.8 mg Oral QPM    have reviewed scheduled and prn medications.  Physical Exam: General:NAD, comfortable, somewhat somnolent this morning Heart:RRR, s1s2 nl Lungs: Clear b/l, no crackle Abdomen:soft, Non-tender, non-distended Extremities: Lower extremity pitting edema, chronic skin changes, trace stump edema. Neurology: Alert awake and following commands.  Laramie Meissner Tanna Furry 03/30/2022,8:12 AM  LOS: 12 days

## 2022-03-31 DIAGNOSIS — Z66 Do not resuscitate: Secondary | ICD-10-CM

## 2022-03-31 DIAGNOSIS — I5033 Acute on chronic diastolic (congestive) heart failure: Secondary | ICD-10-CM | POA: Diagnosis not present

## 2022-03-31 DIAGNOSIS — Z7189 Other specified counseling: Secondary | ICD-10-CM | POA: Diagnosis not present

## 2022-03-31 DIAGNOSIS — Z8673 Personal history of transient ischemic attack (TIA), and cerebral infarction without residual deficits: Secondary | ICD-10-CM | POA: Diagnosis not present

## 2022-03-31 DIAGNOSIS — Z515 Encounter for palliative care: Secondary | ICD-10-CM | POA: Diagnosis not present

## 2022-03-31 LAB — BPAM RBC
Blood Product Expiration Date: 202403062359
ISSUE DATE / TIME: 202402031447
Unit Type and Rh: 5100

## 2022-03-31 LAB — TYPE AND SCREEN
ABO/RH(D): O POS
Antibody Screen: NEGATIVE
Unit division: 0

## 2022-03-31 LAB — BASIC METABOLIC PANEL
Anion gap: 10 (ref 5–15)
BUN: 100 mg/dL — ABNORMAL HIGH (ref 6–20)
CO2: 20 mmol/L — ABNORMAL LOW (ref 22–32)
Calcium: 8.4 mg/dL — ABNORMAL LOW (ref 8.9–10.3)
Chloride: 108 mmol/L (ref 98–111)
Creatinine, Ser: 4.08 mg/dL — ABNORMAL HIGH (ref 0.61–1.24)
GFR, Estimated: 16 mL/min — ABNORMAL LOW (ref >60)
Glucose, Bld: 113 mg/dL — ABNORMAL HIGH (ref 70–99)
Potassium: 3.3 mmol/L — ABNORMAL LOW (ref 3.5–5.1)
Sodium: 138 mmol/L (ref 135–145)

## 2022-03-31 LAB — GLUCOSE, CAPILLARY
Glucose-Capillary: 102 mg/dL — ABNORMAL HIGH (ref 70–99)
Glucose-Capillary: 141 mg/dL — ABNORMAL HIGH (ref 70–99)
Glucose-Capillary: 103 mg/dL — ABNORMAL HIGH (ref 70–99)
Glucose-Capillary: 147 mg/dL — ABNORMAL HIGH (ref 70–99)

## 2022-03-31 LAB — MAGNESIUM: Magnesium: 2 mg/dL (ref 1.7–2.4)

## 2022-03-31 MED ORDER — POTASSIUM CHLORIDE CRYS ER 20 MEQ PO TBCR
20.0000 meq | EXTENDED_RELEASE_TABLET | Freq: Once | ORAL | Status: AC
  Administered 2022-03-31: 20 meq via ORAL
  Filled 2022-03-31: qty 1

## 2022-03-31 NOTE — Plan of Care (Signed)
  Problem: Education: Goal: Ability to verbalize understanding of medication therapies will improve Outcome: Progressing   Problem: Coping: Goal: Ability to adjust to condition or change in health will improve Outcome: Progressing   Problem: Safety: Goal: Ability to remain free from injury will improve Outcome: Progressing   Problem: Elimination: Goal: Will not experience complications related to urinary retention Outcome: Progressing   Problem: Skin Integrity: Goal: Risk for impaired skin integrity will decrease Outcome: Progressing

## 2022-03-31 NOTE — Progress Notes (Signed)
PROGRESS NOTE    [name]  OIN:[id number] DOB: 02/07/1961 DOA: 03/17/2022 PCP: Jodi Marble, MD   Brief Narrative:   62 y.o. male past medical history of chronic diastolic heart failure, chronic kidney disease stage IV, diabetes mellitus type 2, peripheral vascular disease with a BKA brought in for shortness of breath, CT of the chest showed groundglass opacitie.  Patient was found to have pulmonary edema requiring diuresis which has been managed by nephrology team.  Patient is retaining quite a bit of urine but adamantly refusing any straight cath or Foley catheter placement despite of me explaining him the benefit of this.  At this time patient's kidney continues to worsen, he has been seen by palliative care service as well.   Assessment & Plan:  Principal Problem:   Acute on chronic diastolic CHF (congestive heart failure) (HCC) Active Problems:   Acute respiratory failure with hypoxia (HCC)   Type 2 diabetes mellitus with stage 4 chronic kidney disease and hypertension (HCC)   Morbid (severe) obesity due to excess calories (HCC)   CKD (chronic kidney disease) stage 4, GFR 15-29 ml/min (HCC)   CVA (HCC) with residual right-sided weakness   History of left below knee amputation ([place])   Essential hypertension   Need for emotional support   Counseling and coordination of care   History of CVA (cerebrovascular accident)   Palliative care encounter       Acute respiratory failure with hypoxia secondary to pulmonary edema/chronic diastolic heart failure: Initially required diuresis.  Nephrology is following.   Acute kidney injury chronic kidney disease stage IV Metabolic acidosis Uremia Creatinine is around 3.0-3.5.  Cont bicarbonate tablets.  Nephrology team following and guiding diuresis, at this time there is no indication for urgent dialysis.  Gentle hydration Renal US- medical dz.  Patient has had persistent urinary retention for which she has refused Foley catheter  and creatinine continues to rise.  Today it appears his creatinine is slightly better.  As per patient request we can hold off on Foley catheter placement but if he continues to decline he eventually may go into renal failure requiring dialysis.  His BUN is also elevated but awake during my evaluation.  He is not showing any severe signs of uremia at this point.   Essential hypertension: On Norvasc, Coreg.  IV as needed   Chest pain: Resolved Improved with GI cocktail   Peripheral vascular disease/diabetes mellitus type 2: With an A1c of 6.1. Long-acting.  Sliding scale Accu-Chek.  Adjust as necessary. Continue Lyrica   Normocytic anemia: Continue to monitor.  Received Aranesp.  Hemoglobin 7.4, 1 unit PRBC transfusion   Leukopenia: Borderline we will need to follow-up as an outpatient with PCP.  Patient has been seen by palliative care team given his comorbidities.  Appreciate their input.  Patient is now DNR/DNI.  PT/OT  DVT prophylaxis: Subcu heparin Code Status: Full code Family Communication:   Status is: Inpatient Remains inpatient appropriate because: Volume status slightly better.  Renal function still remains elevated.   Subjective: Patient is awake eating his breakfast.  Does not have any complaints  Examination: Constitutional: Not in acute distress Respiratory: Clear to auscultation bilaterally Cardiovascular: Normal sinus rhythm, no rubs Abdomen: Nontender nondistended good bowel sounds Musculoskeletal: No edema noted Skin: No rashes seen Neurologic: CN 2-12 grossly intact.  And nonfocal Psychiatric: Normal judgment and insight. Alert and oriented x 3. Normal mood. Left-sided BKA External Foley catheter in place  Objective: Vitals:   03/30/22 2116  03/31/22 0422 03/31/22 0454 03/31/22 0850  BP: (!) 141/79 130/79  (!) 148/75  Pulse: 70 71  74  Resp: (!) 22 (!) 22  18  Temp: 98.2 F (36.8 C) 98.3 F (36.8 C)    TempSrc: Oral Oral    SpO2: 98% 99%  97%   Weight:   106.1 kg   Height:        Intake/Output Summary (Last 24 hours) at 03/31/2022 1134 Last data filed at 03/31/2022 0902 Gross per 24 hour  Intake 1417.5 ml  Output 650 ml  Net 767.5 ml   Filed Weights   03/28/22 0500 03/30/22 0452 03/31/22 0454  Weight: 116.2 kg 104.2 kg 106.1 kg     Data Reviewed:   CBC: Recent Labs  Lab 03/27/22 0805 03/30/22 1152  WBC 3.2* 3.9*  HGB 7.2* 7.4*  HCT 23.4* 24.7*  MCV 83.0 86.1  PLT 184 010   Basic Metabolic Panel: Recent Labs  Lab 03/27/22 0805 03/28/22 0800 03/29/22 0709 03/30/22 1152 03/31/22 0453  NA 133* 137 139 139 138  K 3.4* 3.8 3.7 3.5 3.3*  CL 104 104 108 108 108  CO2 20* 21* 20* 20* 20*  GLUCOSE 111* 103* 116* 137* 113*  BUN 87* 88* 88* 94* 100*  CREATININE 3.57* 3.78* 3.91* 4.12* 4.08*  CALCIUM 7.8* 8.3* 8.4* 8.2* 8.4*  MG  --  2.2 2.1 2.0 2.0   GFR: Estimated Creatinine Clearance: 22.7 mL/min (A) (by C-G formula based on SCr of 4.08 mg/dL (H)). Liver Function Tests: Recent Labs  Lab 03/25/22 0935 03/26/22 0500 03/27/22 0805 03/28/22 0800  ALBUMIN 2.4* 3.0* 3.0* 3.2*   No results for input(s): "LIPASE", "AMYLASE" in the last 168 hours. No results for input(s): "AMMONIA" in the last 168 hours. Coagulation Profile: No results for input(s): "INR", "PROTIME" in the last 168 hours. Cardiac Enzymes: No results for input(s): "CKTOTAL", "CKMB", "CKMBINDEX", "TROPONINI" in the last 168 hours. BNP (last 3 results) No results for input(s): "PROBNP" in the last 8760 hours. HbA1C: No results for input(s): "HGBA1C" in the last 72 hours. CBG: Recent Labs  Lab 03/30/22 1149 03/30/22 1646 03/30/22 2114 03/31/22 0733 03/31/22 1127  GLUCAP 129* 130* 142* 102* 141*   Lipid Profile: No results for input(s): "CHOL", "HDL", "LDLCALC", "TRIG", "CHOLHDL", "LDLDIRECT" in the last 72 hours. Thyroid Function Tests: No results for input(s): "TSH", "T4TOTAL", "FREET4", "T3FREE", "THYROIDAB" in the last 72  hours. Anemia Panel: No results for input(s): "VITAMINB12", "FOLATE", "FERRITIN", "TIBC", "IRON", "RETICCTPCT" in the last 72 hours. Sepsis Labs: No results for input(s): "PROCALCITON", "LATICACIDVEN" in the last 168 hours.  No results found for this or any previous visit (from the past 240 hour(s)).        Radiology Studies: No results found.      Scheduled Meds:  amLODipine  10 mg Oral Daily   aspirin  81 mg Oral Daily   brimonidine  1 drop Both Eyes BID   carvedilol  12.5 mg Oral BID WC   dorzolamide  1 drop Both Eyes BID   heparin  5,000 Units Subcutaneous Q8H   hydrALAZINE  50 mg Oral Q8H   insulin aspart  0-5 Units Subcutaneous QHS   insulin aspart  0-9 Units Subcutaneous TID WC   insulin aspart  2 Units Subcutaneous TID WC   insulin glargine-yfgn  10 Units Subcutaneous QHS   latanoprost  1 drop Both Eyes QHS   pantoprazole  40 mg Oral Daily   pregabalin  75 mg Oral Daily  sertraline  50 mg Oral QODAY   sodium bicarbonate  1,300 mg Oral BID   tamsulosin  0.8 mg Oral QPM   Continuous Infusions:   LOS: 13 days   Time spent= 35 mins    [name] Arsenio Loader, MD [facility]  If 7PM-7AM, please contact night-coverage  03/31/2022, 11:34 AM

## 2022-03-31 NOTE — Progress Notes (Addendum)
[name] NEPHROLOGY PROGRESS NOTE  Assessment/ Plan: Pt is a 62 y.o. yo male with history of CHF, DM, peripheral vascular disease status post left BKA, stroke, HTN, BPH, CKD stage IV, presented with shortness of breath and hypoxia seen as a consultation for AKI on CKD.  # Acute kidney injury on CKD stage IV with baseline creatinine level 2.5-2.6.  AKI thought to be due to decompensated CHF with anasarca/pulm edema.  He was initially treated with IV diuretics which was later discontinued because of continue rising BUN and creatinine level.  He has persistent urinary retention for which he is refusing Foley catheter this is exacerbating worsening renal function. He has peripheral edema up to his thigh therefore he will need diuretics.  I will not restart diuretics as it will worsen the urine retention. He was quite somnolent this morning, I am concerned that he is becoming uremic.  Noted he was seen by palliative care team to discuss about continuing medical care versus comfort care with plans to talk to his brother.   I have discussed with the patient that if he does not have Foley catheter then the kidney function will continue to decline and he will need dialysis.  He tells me that he is still thinking about it.  Fortunately, there is no urgent need to start dialysis today.  If he decides to pursue dialysis then recommend to transfer him to [facility].  # Acute urinary retention: Again, I emphasized him to get Foley catheter in order to relieve bladder obstruction. Patient is adamant about not having intraurethral catheter.  He understands that he may be heading toward requiring dialysis if no improvement in renal function.  # Hypokalemia: Replete potassium chloride.  Monitor lab.  # Hypertension: Blood pressure acceptable.  Continue current antihypertensive medication diuretics as above.  # Acute respiratory failure with hypoxia due to acute pulmonary edema/CHF.  Management  as above.  Currently on room air.  # Metabolic acidosis: Continue sodium bicarbonate.  Monitor serum CO2 level.  # Anemia partly due to CKD.  Received IV iron twice.  Received Aranesp on 1/30.  PRBC transfusion on 2/3.  Discussed with the primary team..  Subjective: I have seen and examined at bedside.  Quite somnolent today however able to answer questions.  He denies chest pain, shortness of breath, nausea, vomiting, dysgeusia.  Urine output is recorded 950 cc.  Still refusing insertion of Foley catheter.  Reports thinking about further medical care including dialysis and has not decided yet.  Objective Vital signs in last 24 hours: Vitals:   03/30/22 1732 03/30/22 2116 03/31/22 0422 03/31/22 0454  BP: (!) 140/84 (!) 141/79 130/79   Pulse: 69 70 71   Resp: 18 (!) 22 (!) 22   Temp: 98.2 F (36.8 C) 98.2 F (36.8 C) 98.3 F (36.8 C)   TempSrc: Oral Oral Oral   SpO2: 99% 98% 99%   Weight:    106.1 kg  Height:       Weight change: 1.9 kg  Intake/Output Summary (Last 24 hours) at 03/31/2022 0818 Last data filed at 03/31/2022 0405 Gross per 24 hour  Intake 1377.5 ml  Output 950 ml  Net 427.5 ml        Labs: RENAL PANEL Recent Labs    10/20/21 1259 10/21/21 0234 10/21/21 1501 10/23/21 0947 10/24/21 1051 10/25/21 0451 10/26/21 9937 10/27/21 1696 10/28/21 7893 10/29/21 0251 10/30/21 0220 10/31/21 1249 11/01/21 8101 12/08/21 1919 12/09/21 0526 03/17/22 1440 03/18/22 0404 03/19/22 7510  03/22/22 0956 03/23/22 0755 03/24/22 0534 03/25/22 0935 03/26/22 0500 03/27/22 0805 03/28/22 0800 03/29/22 0709 03/30/22 1152 03/31/22 0453  NA 140 139   < > '138 138 139 138 139 138 139 139 138 139 '$ 139   < > 134* 139   < > 138 137 137 134* 135 133* 137 139 139 138  K 5.0 4.6   < > 3.8 4.1 3.8 3.5 3.5 3.2* 3.0* 3.3* 3.3* 3.1* 5.1   < > 4.3 4.1   < > 3.5 3.5 3.3* 3.4* 3.8 3.4* 3.8 3.7 3.5 3.3*  CL 117* 116*   < > 113* 113* 113* 112* 112* 111 109 105 106 106 115*   < > 112*  113*   < > '106 106 105 105 105 104 104 108 108 108 '$  CO2 16* 16*   < > 16* 17* 18* 18* 21* 19* 20* 22 21* 23 20*   < > 17* 20*   < > '23 23 23 22 '$ 18* 20* 21* 20* 20* 20*  GLUCOSE 151* 124*   < > 144* 149* 138* 136* 130* 146* 130* 185* 167* 113* 126*   < > 171* 104*   < > 134* 117* 101* 131* 115* 111* 103* 116* 137* 113*  BUN 43* 45*   < > 48* 47* 47* 48* 49* 50* 48* 49* 53* 55* 39*   < > 57* 53*   < > 64* 64* 64* 76* 75* 87* 88* 88* 94* 100*  CREATININE 2.20* 2.19*   < > 3.37* 3.58* 3.74* 3.79* 3.69* 3.45* 3.33* 3.14* 3.02* 2.89* 2.53*   < > 3.22* 3.17*   < > 3.05* 3.13* 3.00* 2.91* 3.49* 3.57* 3.78* 3.91* 4.12* 4.08*  CALCIUM 8.3* 7.9*   < > 7.9* 7.7* 7.7* 7.8* 7.9* 7.8* 8.1* 8.2* 8.2* 8.4* 8.4*   < > 7.8* 8.0*   < > 8.2* 8.0* 8.2* 7.9* 8.2* 7.8* 8.3* 8.4* 8.2* 8.4*  MG 1.9 1.9  --   --   --  1.9  --   --   --  1.7  --  1.6* 1.6*  --   --   --   --   --   --   --   --   --   --   --  2.2 2.1 2.0 2.0  PHOS  --   --   --  5.9* 6.1* 6.0* 5.9* 5.8* 5.6* 5.5* 4.7* 4.5  --   --   --   --   --   --   --   --   --   --   --   --   --   --   --   --   ALBUMIN  --   --   --  2.3* 2.0* 2.0* 2.1* 2.0* 1.9* 2.0* 2.0* 2.0*  --  2.6*  --  2.7* 2.5*  --   --   --   --  2.4* 3.0* 3.0* 3.2*  --   --   --    < > = values in this interval not displayed.      Liver Function Tests: Recent Labs  Lab 03/26/22 0500 03/27/22 0805 03/28/22 0800  ALBUMIN 3.0* 3.0* 3.2*    No results for input(s): "LIPASE", "AMYLASE" in the last 168 hours. No results for input(s): "AMMONIA" in the last 168 hours. CBC: Recent Labs    10/20/21 0449 10/21/21 0234 03/19/22 0754 03/20/22 1116 03/21/22 0751 03/27/22 0805 03/30/22 1152  HGB  --    < > 7.5* 7.9* 7.6* 7.2* 7.4*  MCV  --    < > 83.8 82.7 82.9 83.0 86.1  VITAMINB12 439  --  321  --   --   --   --   FOLATE 6.9  --  9.3  --   --   --   --   FERRITIN 164  --  235  --   --   --   --   TIBC 227*  --  220*  --   --   --   --   IRON 12*  --  26*  --   --   --   --    RETICCTPCT 2.6  --  1.5  --   --   --   --    < > = values in this interval not displayed.     Cardiac Enzymes: No results for input(s): "CKTOTAL", "CKMB", "CKMBINDEX", "TROPONINI" in the last 168 hours. CBG: Recent Labs  Lab 03/30/22 0723 03/30/22 1149 03/30/22 1646 03/30/22 2114 03/31/22 0733  GLUCAP 131* 129* 130* 142* 102*     Iron Studies: No results for input(s): "IRON", "TIBC", "TRANSFERRIN", "FERRITIN" in the last 72 hours. Studies/Results: No results found.  Medications: Infusions:    Scheduled Medications:  amLODipine  10 mg Oral Daily   aspirin  81 mg Oral Daily   brimonidine  1 drop Both Eyes BID   carvedilol  12.5 mg Oral BID WC   dorzolamide  1 drop Both Eyes BID   heparin  5,000 Units Subcutaneous Q8H   hydrALAZINE  50 mg Oral Q8H   insulin aspart  0-5 Units Subcutaneous QHS   insulin aspart  0-9 Units Subcutaneous TID WC   insulin aspart  2 Units Subcutaneous TID WC   insulin glargine-yfgn  10 Units Subcutaneous QHS   latanoprost  1 drop Both Eyes QHS   pantoprazole  40 mg Oral Daily   pregabalin  75 mg Oral Daily   sertraline  50 mg Oral QODAY   sodium bicarbonate  1,300 mg Oral BID   tamsulosin  0.8 mg Oral QPM    have reviewed scheduled and prn medications.  Physical Exam: General:NAD, looks comfortable but somewhat somnolent. Heart:RRR, s1s2 nl Lungs: Clear b/l, no crackle Abdomen:soft, Non-tender, non-distended Extremities: Lower extremity pitting edema presented up to thigh, chronic skin changes, trace stump edema. Neurology: Alert awake and following commands.  [name] 03/31/2022,8:18 AM  LOS: 13 days

## 2022-03-31 NOTE — Progress Notes (Signed)
Daily Progress Note   Patient Name: [name]       Date: 03/31/2022 DOB: 1960-04-01  Age: 62 y.o. MRN#: [id number] Attending Physician: Damita Lack, MD Primary Care Physician: Jodi Marble, MD Admit Date: 03/17/2022 Length of Stay: 13 days  Reason for Consultation/Follow-up: Establishing goals of care  Subjective:   CC: Patient in better mood since heard this morning doesn't  currently are dialysis or Foley catheter insertion.  Following up regarding complex medical decision making.  Subjective:  Presented to bedside to check on patient today.  Seen laying comfortably in bed and welcoming visit.  Patient was feeling today.  Patient noted he is in a better mood today because he has heard that he does not currently require dialysis and his kidney function is slightly improved this morning so he does not require Foley insertion at this time.  Patient very happy about this.  Noted would follow-up with hospitalist regarding this. Spent time inquiring if patient had talked further about our extensive discussions yesterday about pathways moving forward for medical care.  Patient noted that he had and he still conflicted.  Patient stated that he did not get a chance to talk with his brother yesterday as his brother yesterday though he plans to discuss this with him.  Again offered to call brother though patient did not want me to at this time. Spent time providing emotional support.  Patient noted that he did speak with his 86 year old son yesterday and this was difficult for him.  Had asked his son why he had been up to visit and his son had told him he "did not want to see him this way".  That was hard for the patient to hear and he still processing this today.  Inquired about other emotional supports for son and other family support.  Patient noted that he is married though his wife is likely currently in church so he did not want her called.  He noted that he has 4 children total is why  he is debating so much about his medical care.  He wants to be able to be there for them.  His oldest son is 52 yo, his daughter is 52yo, his third son is 84yo, and his youngest son is 89yo.  Inquired if he had discussed his medical care with the rest of his children he noted he had not yet.  It has been hard for him to process all of this and talk about things.  Acknowledged this and noted providers are available to support him if needed for discussions.  Also inquired with patient why children's and oral and wife's contact information was not provided in demographics.  Patient noted he "wanted it that way" because he wants his brother to make medical decisions if he is unable to do so.  We discussed the laws of [place] and the need for completion of HCPOA documentation.  Patient has this paperwork at bedside though it has not been completed.  Again enforced importance of this and that would reach out to chaplain to assist with completion based on his stated wishes that he wants his brother to be his decision maker not his wife or children over the age of 57.  Acknowledged this and willingness to complete.  With permission, also able to discuss patient's CODE STATUS again.  Again discussed full code versus DNR.  Patient noted he has been thinking hard about this and it is just hard for him to cope  with ideas of death.  We discussed that if patient's goal is to have a good quality of life and be as interactive with his family and children as he is able to, if he was sick enough that his heart were to stop or he were to stop breathing, mechanical ventilation and chest compressions would not allow him to return to this good quality of life and time with family.  Patient did acknowledge this.  We discussed changing CODE STATUS to DNR, continue all appropriate medical therapies at this time.  Again confirmed that DNR only comes into place should patient's heart stop or he stops breathing, then would allow a  natural death.  Patient agreeing with CODE STATUS changed to DNR, continue appropriate medical interventions at this time.  Again readdressed dialysis as this has been a big concern for patient.  Inquired that if the point comes where he ever requires dialysis, would he even want it.  Patient said "Nah. I wouldn't want to suffer that way".  Acknowledged this and importance of continued conversations and stating to providers his wishes regarding medical care.  Again acknowledged patient is grieving his loss of independence and his mortality.  Spent time answering questions and providing emotional support.  Patient welcomed continued visits from provider.  Noted would plan to check on him tomorrow.  Discussed care with bedside RN and hospitalist after visit.  Objective:   Vital Signs:  BP (!) 148/75 (BP Location: Right Arm)   Pulse 74   Temp 98.3 F (36.8 C) (Oral)   Resp 18   Ht '5\' 8"'$  (1.727 m)   Wt 106.1 kg   SpO2 97%   BMI 35.57 kg/m   Physical Exam: General: NAD, alert, does not, chronically ill appearing Eyes: no drainage noted HENT:  moist mucous membranes Cardiovascular: RRR, no edema in LE b/l Respiratory: no increased work of breathing noted, not in respiratory distress Abdomen: not distended Extremities: L BKA Skin: no rashes or lesions on visible skin Neuro: A&Ox4, following commands easily Psych: appropriately answers all questions  Imaging:  I personally reviewed recent imaging.   Assessment & Plan:   Assessment:  Patient is a 62 year old male with a past medical history of chronic diastolic heart failure, chronic kidney disease stage IV, diabetes mellitus type 2, CVA with right sided residual effects, and peripheral vascular disease status post left BKA who was admitted on 03/17/2022 for management of shortness of breath.  Imaging upon admission showed groundglass opacities.  Patient was found to have pulmonary edema requiring diuresis managed by nephrology.   Patient has had complications of worsening renal status in the setting and urinary retention.  Palliative medicine team consulted to assist with complex medical decision making.  Recommendations/Plan: # Complex medical decision making/goals of care:  - Extensive conversation with patient at bedside again today.  Patient is very grateful he heard today that he did not need dialysis at this time and that his kidney function is slightly improved so he does not need a Foley catheter placed currently.  Patient willing to continue with appropriate medical interventions at this time with hope he will continue to see kidney function improvement.   -When inquiring about dialysis if eventually needed in the future, patient responded "Nah. I wouldn't want to suffer that way". Again emphasized importance conversations with providers moving forward guarding medical care patient would find appropriate for him.  -Patient informed this provider that he is married with 4 children, 3 of which over the age of  35 (ages 54yo, 61yo, 61yo, and 61yo).  Explained importance of completing HCPOA paperwork that was at bedside.  Noted would involve chaplain to assist with completion as patient has stated he would want his brother, him Graves, to make medical decisions for him if he was unable to make his own medical decisions.  Patient noted this is why he has not given contact information for his other family members.    Code Status: DNR   -Extensive conversation regarding CODE STATUS as described above in HPI.  Should patient be sick enough that his heart were to stop or he were to stop breathing, would not want to receive CPR or mechanical ventilation.  Patient would want to be allowed to have a natural death.  Patient's quality of life and spending time with his children is important to him in regards to medical decisions moving forward.  Patient willing to continue other appropriate medical interventions at this time.  # Symptom  management:  -As per primary team  # Psychosocial Support:  -Brother- again didn't want me to call today. Patient stating he is married with 4 children, 3 of which are over 78.   -Messaged chaplain about completion of HCPOA documentation. Patient wanting brother, Sheryl Towell, to make medical decisions if he is unable to so this needs to be completed and paperwork since he is married with adult children.  # Discharge Planning: TBD  Discussed with: patient, hospitalist, bedside RN  Thank you for allowing the palliative care team to participate in the care [name].  Chelsea Aus, DO Palliative Care Provider PMT # [phone]  If patient remains symptomatic despite maximum doses, please call PMT at [phone] between 0700 and 1900. Outside of these hours, please call attending, as PMT does not have night coverage.

## 2022-03-31 NOTE — Progress Notes (Signed)
  Daily Progress Note   Patient Name: [name]       Date: 03/31/2022 DOB: 19-Jul-1960  Age: 62 y.o. MRN#: [id number] Attending Physician: Damita Lack, MD Primary Care Physician: Jodi Marble, MD Admit Date: 03/17/2022 Length of Stay: 13 days  Will place full progress note as soon as able. Good discussion with patient today to support processing his grief in the setting of his multiple medical conditions. While he wants to continue with appropriate medical care, he agrees that if he was sick enough his heart were to stop or he were stop stop breathing he would not want chest compression or mechanical ventilation because would not lead back to good quality of life, only suffering. He would want to be allowed to have a natural death. Code status appropriately updated to DNR, continue appropraite medical interventions.   Chelsea Aus, DO Palliative Care Provider PMT # [phone]

## 2022-04-01 DIAGNOSIS — J9601 Acute respiratory failure with hypoxia: Secondary | ICD-10-CM | POA: Diagnosis not present

## 2022-04-01 DIAGNOSIS — I5033 Acute on chronic diastolic (congestive) heart failure: Secondary | ICD-10-CM | POA: Diagnosis not present

## 2022-04-01 DIAGNOSIS — N179 Acute kidney failure, unspecified: Secondary | ICD-10-CM

## 2022-04-01 DIAGNOSIS — Z8673 Personal history of transient ischemic attack (TIA), and cerebral infarction without residual deficits: Secondary | ICD-10-CM | POA: Diagnosis not present

## 2022-04-01 DIAGNOSIS — Z89512 Acquired absence of left leg below knee: Secondary | ICD-10-CM | POA: Diagnosis not present

## 2022-04-01 LAB — BASIC METABOLIC PANEL
Anion gap: 12 (ref 5–15)
BUN: 91 mg/dL — ABNORMAL HIGH (ref 6–20)
CO2: 19 mmol/L — ABNORMAL LOW (ref 22–32)
Calcium: 8.5 mg/dL — ABNORMAL LOW (ref 8.9–10.3)
Chloride: 106 mmol/L (ref 98–111)
Creatinine, Ser: 4.01 mg/dL — ABNORMAL HIGH (ref 0.61–1.24)
GFR, Estimated: 16 mL/min — ABNORMAL LOW (ref >60)
Glucose, Bld: 96 mg/dL (ref 70–99)
Potassium: 3.6 mmol/L (ref 3.5–5.1)
Sodium: 137 mmol/L (ref 135–145)

## 2022-04-01 LAB — GLUCOSE, CAPILLARY
Glucose-Capillary: 92 mg/dL (ref 70–99)
Glucose-Capillary: 130 mg/dL — ABNORMAL HIGH (ref 70–99)
Glucose-Capillary: 155 mg/dL — ABNORMAL HIGH (ref 70–99)
Glucose-Capillary: 133 mg/dL — ABNORMAL HIGH (ref 70–99)

## 2022-04-01 LAB — MAGNESIUM: Magnesium: 2.2 mg/dL (ref 1.7–2.4)

## 2022-04-01 NOTE — Progress Notes (Signed)
[facility] NEPHROLOGY PROGRESS NOTE   Subjective: 1000 cc UOP yesterday, creat 4.01 today , unchanged.    Summary- Pt is a 62 y.o. yo male with history of CHF, DM, peripheral vascular disease status post left BKA, stroke, HTN, BPH, CKD stage IV, presented with shortness of breath and hypoxia seen as a consultation for AKI on CKD.   Objective Vital signs in last 24 hours: Vitals:   03/31/22 0850 03/31/22 1323 03/31/22 2128 04/01/22 0613  BP: (!) 148/75 (!) 141/78 125/83 129/75  Pulse: 74 68 65 65  Resp: 18 17 (!) 22 16  Temp:  97.7 F (36.5 C) 97.8 F (36.6 C) 98 F (36.7 C)  TempSrc:  Oral Oral Oral  SpO2: 97% 100% 98% 97%  Weight:      Height:       Physical Exam: General:NAD, looks comfortable but somewhat somnolent. Heart:RRR, s1s2 nl Lungs: Clear b/l, no crackle Abdomen:soft, Non-tender, non-distended Extremities: Lower extremity pitting edema presented up to thigh, chronic skin changes, trace stump edema. Neurology: Alert awake and following commands.    Assessment/ Plan: # AKI on CKD IV - w/ baseline creatinine level 2.5-2.6.  AKI thought to be due to decompensated CHF with anasarca/pulm edema.  He was initially treated with IV diuretics which was later discontinued (1/30) because of continue rising BUN and creatinine level.  He now has persistent urinary retention for which he is refusing Foley catheter this is exacerbating worsening renal function. He has peripheral edema up to his thigh and needs diuretics but we cannot restart diuretics as it will worsen the urine retention. He is more lethargic,there is concern that he may be becoming uremic.  We have discussed w/ pt that if he does not have Foley catheter then the kidney function will continue to decline. Pt meeting w/ palliative care currently to discuss goals of care. Will follow.   # Acute urinary retention: Again, we have emphasized w/ him to get Foley catheter in order to relieve bladder  obstruction. Patient is adamant about not having intraurethral catheter.  He understands that he may be heading for end-stage kidney failure if no improvement in renal function.  # Hypokalemia: Replete potassium chloride.  Monitor lab.  # Hypertension: Blood pressure acceptable.  Continue current antihypertensive medication diuretics as above.  # Acute respiratory failure with hypoxia due to acute pulmonary edema/CHF - on admission. This was resolved w/ initial round of diuretics. Currently on room air.  # Metabolic acidosis: Continue sodium bicarbonate.  Monitor serum CO2 level.  # Anemia partly due to CKD.  Received IV iron twice.  Received Aranesp on 1/30.  PRBC transfusion on 2/3.  Kelly Splinter, MD 04/01/2022, 8:06 AM  Recent Labs  Lab 03/27/22 0805 03/28/22 0800 03/29/22 0709 03/30/22 1152 03/31/22 0453  HGB 7.2*  --   --  7.4*  --   ALBUMIN 3.0* 3.2*  --   --   --   CALCIUM 7.8* 8.3*   < > 8.2* 8.4*  CREATININE 3.57* 3.78*   < > 4.12* 4.08*  K 3.4* 3.8   < > 3.5 3.3*   < > = values in this interval not displayed.    Inpatient medications:  amLODipine  10 mg Oral Daily   aspirin  81 mg Oral Daily   brimonidine  1 drop Both Eyes BID   carvedilol  12.5 mg Oral BID WC   dorzolamide  1 drop Both Eyes BID   heparin  5,000 Units Subcutaneous Q8H  hydrALAZINE  50 mg Oral Q8H   insulin aspart  0-5 Units Subcutaneous QHS   insulin aspart  0-9 Units Subcutaneous TID WC   insulin aspart  2 Units Subcutaneous TID WC   insulin glargine-yfgn  10 Units Subcutaneous QHS   latanoprost  1 drop Both Eyes QHS   pantoprazole  40 mg Oral Daily   pregabalin  75 mg Oral Daily   sertraline  50 mg Oral QODAY   sodium bicarbonate  1,300 mg Oral BID   tamsulosin  0.8 mg Oral QPM    acetaminophen **OR** acetaminophen, guaiFENesin, hydrALAZINE, ipratropium-albuterol, metoprolol tartrate, nitroGLYCERIN, ondansetron **OR** ondansetron (ZOFRAN) IV, senna-docusate, traZODone

## 2022-04-01 NOTE — Progress Notes (Signed)
Chaplain engaged in a follow-up visit with [name].  Chaplain helped [name] complete the Scientist, physiological, Healthcare POA document.  Chaplain could not find enough witnesses at this time to complete Part C, but Chaplain will follow-up later today or tomorrow morning to get it done.  [name] was agreeable to this.    Chaplain offered education, support, compassion, and listening during this time.  [name] would like to assign his brother, [name], as his healthcare agent.    [name], MDiv  04/01/22 2589  Spiritual Encounters  Type of Visit Follow up  Care provided to: Patient  Reason for visit Advance directives

## 2022-04-01 NOTE — TOC Progression Note (Signed)
Transition of Care [facility]) - Progression Note    Patient Details  Name: [name] MRN: [id number] Date of Birth: 06/18/1960  Transition of Care [facility]) CM/SW Contact  Anja Neuzil, Juliann Pulse, RN Phone Number: 04/01/2022, 12:16 PM  Clinical Narrative:   Nephrology following-renal fxn issues-awaiting medical stability for return back to [facility].     Expected Discharge Plan: Long Term Nursing Home Barriers to Discharge: Continued Medical Work up  Expected Discharge Plan and Services     Post Acute Care Choice: [place] Living arrangements for the past 2 months: [place]                                       Social Determinants of Health (SDOH) Interventions SDOH Screenings   Food Insecurity: No Food Insecurity (03/18/2022)  Housing: Low Risk  (03/18/2022)  Transportation Needs: No Transportation Needs (03/18/2022)  Utilities: Not At Risk (03/18/2022)  Alcohol Screen: Low Risk  (07/30/2017)  Depression (PHQ2-9): Low Risk  (07/10/2020)  Tobacco Use: Medium Risk (03/23/2022)    Readmission Risk Interventions    03/20/2022   12:20 PM 12/10/2021    8:41 AM 01/12/2020    3:39 PM  Readmission Risk Prevention Plan  Transportation Screening Complete Complete Complete  Palliative Care Screening   Not Applicable  Medication Review (RN Care Manager) Complete Complete   PCP or Specialist appointment within 3-5 days of discharge Complete Complete   HRI or Home Care Consult Complete Complete   SW Recovery Care/Counseling Consult Complete Complete   Palliative Care Screening Not Applicable Not Applicable   Skilled Nursing Facility Complete Complete

## 2022-04-01 NOTE — Progress Notes (Signed)
Spiritual Care Note  Ensured that Mr Termini' Advance Directives paperwork was witnessed, notarized, copied, and provided to unit nursing secretary to be scanned into his electronic medical record. Returned original and two copies to Mr Zajkowski.  [address] Shelva Majestic, [facility] Pager [phone]    04/01/22 1508  Spiritual Encounters  Type of Visit Follow up  Care provided to: Patient  Referral source Chaplain team  Reason for visit Advance directives  OnCall Visit No  Advance Directives (For Healthcare)  Does Patient Have a Medical Advance Directive? Yes  Type of Paramedic of [place];Living will

## 2022-04-01 NOTE — Progress Notes (Signed)
PROGRESS NOTE    [name]  YHC:[id number] DOB: 02/01/1961 DOA: 03/17/2022 PCP: Jodi Marble, MD   Brief Narrative:   62 y.o. male past medical history of chronic diastolic heart failure, chronic kidney disease stage IV, diabetes mellitus type 2, peripheral vascular disease with a BKA brought in for shortness of breath, CT of the chest showed groundglass opacitie.  Patient was found to have pulmonary edema requiring diuresis which has been managed by nephrology team.  Patient is retaining quite a bit of urine but adamantly refusing any straight cath or Foley catheter placement despite of me explaining him the benefit of this.  At this time patient's kidney continues to worsen, he has been seen by palliative care service as well.  Patient was made DNR.   Assessment & Plan:  Principal Problem:   Acute on chronic diastolic CHF (congestive heart failure) (HCC) Active Problems:   Acute respiratory failure with hypoxia (HCC)   Type 2 diabetes mellitus with stage 4 chronic kidney disease and hypertension (HCC)   Morbid (severe) obesity due to excess calories (HCC)   CKD (chronic kidney disease) stage 4, GFR 15-29 ml/min (HCC)   CVA (HCC) with residual right-sided weakness   History of left below knee amputation ([place])   Essential hypertension   Need for emotional support   Counseling and coordination of care   History of CVA (cerebrovascular accident)   Palliative care encounter   DNR (do not resuscitate)       Acute respiratory failure with hypoxia secondary to pulmonary edema/chronic diastolic heart failure: Initially required diuresis.  Nephrology is following.   Acute kidney injury chronic kidney disease stage IV Metabolic acidosis Uremia Creatinine is around 3.0-3.5.  Cont bicarbonate tablets.  Nephrology following.  Creatinine peaked at 4.12, slowly trending down.  No emergent indication for dialysis. Renal US- medical dz.  Patient is having urinary retention but has  adamantly refused Foley catheter or straight cath   Essential hypertension: On Norvasc, Coreg.  IV as needed   Chest pain: Resolved Improved with GI cocktail   Peripheral vascular disease/diabetes mellitus type 2: With an A1c of 6.1. Long-acting.  Sliding scale Accu-Chek.  Adjust as necessary. Continue Lyrica   Normocytic anemia: Continue to monitor.  Received Aranesp.  Hemoglobin 7.4, 1 unit PRBC transfusion   Leukopenia: Borderline we will need to follow-up as an outpatient with PCP.  Patient has been seen by palliative care team given his comorbidities.  Appreciate their input.  Patient is now DNR/DNI.  PT/OT  DVT prophylaxis: Subcu heparin Code Status: Full code Family Communication: Brother updated periodically  Status is: Inpatient Remains inpatient appropriate because: Volume status slightly better.  Renal function still remains elevated.   Subjective: Seen and examined at bedside, eating his breakfast.  No complaints  Examination: Constitutional: Not in acute distress Respiratory: Clear to auscultation bilaterally Cardiovascular: Normal sinus rhythm, no rubs Abdomen: Nontender nondistended good bowel sounds Musculoskeletal: No edema noted Skin: No rashes seen Neurologic: CN 2-12 grossly intact.  And nonfocal Psychiatric: Normal judgment and insight. Alert and oriented x 3. Normal mood. Left-sided BKA External Foley catheter in place  Objective: Vitals:   03/31/22 0850 03/31/22 1323 03/31/22 2128 04/01/22 0613  BP: (!) 148/75 (!) 141/78 125/83 129/75  Pulse: 74 68 65 65  Resp: 18 17 (!) 22 16  Temp:  97.7 F (36.5 C) 97.8 F (36.6 C) 98 F (36.7 C)  TempSrc:  Oral Oral Oral  SpO2: 97% 100% 98% 97%  Weight:  Height:        Intake/Output Summary (Last 24 hours) at 04/01/2022 1126 Last data filed at 04/01/2022 1610 Gross per 24 hour  Intake 1050 ml  Output 800 ml  Net 250 ml   Filed Weights   03/28/22 0500 03/30/22 0452 03/31/22 0454  Weight:  116.2 kg 104.2 kg 106.1 kg     Data Reviewed:   CBC: Recent Labs  Lab 03/27/22 0805 03/30/22 1152  WBC 3.2* 3.9*  HGB 7.2* 7.4*  HCT 23.4* 24.7*  MCV 83.0 86.1  PLT 184 960   Basic Metabolic Panel: Recent Labs  Lab 03/28/22 0800 03/29/22 0709 03/30/22 1152 03/31/22 0453 04/01/22 0719  NA 137 139 139 138 137  K 3.8 3.7 3.5 3.3* 3.6  CL 104 108 108 108 106  CO2 21* 20* 20* 20* 19*  GLUCOSE 103* 116* 137* 113* 96  BUN 88* 88* 94* 100* 91*  CREATININE 3.78* 3.91* 4.12* 4.08* 4.01*  CALCIUM 8.3* 8.4* 8.2* 8.4* 8.5*  MG 2.2 2.1 2.0 2.0 2.2   GFR: Estimated Creatinine Clearance: 23.1 mL/min (A) (by C-G formula based on SCr of 4.01 mg/dL (H)). Liver Function Tests: Recent Labs  Lab 03/26/22 0500 03/27/22 0805 03/28/22 0800  ALBUMIN 3.0* 3.0* 3.2*   No results for input(s): "LIPASE", "AMYLASE" in the last 168 hours. No results for input(s): "AMMONIA" in the last 168 hours. Coagulation Profile: No results for input(s): "INR", "PROTIME" in the last 168 hours. Cardiac Enzymes: No results for input(s): "CKTOTAL", "CKMB", "CKMBINDEX", "TROPONINI" in the last 168 hours. BNP (last 3 results) No results for input(s): "PROBNP" in the last 8760 hours. HbA1C: No results for input(s): "HGBA1C" in the last 72 hours. CBG: Recent Labs  Lab 03/31/22 1127 03/31/22 1621 03/31/22 2125 04/01/22 0739 04/01/22 1122  GLUCAP 141* 103* 147* 92 130*   Lipid Profile: No results for input(s): "CHOL", "HDL", "LDLCALC", "TRIG", "CHOLHDL", "LDLDIRECT" in the last 72 hours. Thyroid Function Tests: No results for input(s): "TSH", "T4TOTAL", "FREET4", "T3FREE", "THYROIDAB" in the last 72 hours. Anemia Panel: No results for input(s): "VITAMINB12", "FOLATE", "FERRITIN", "TIBC", "IRON", "RETICCTPCT" in the last 72 hours. Sepsis Labs: No results for input(s): "PROCALCITON", "LATICACIDVEN" in the last 168 hours.  No results found for this or any previous visit (from the past 240 hour(s)).         Radiology Studies: No results found.      Scheduled Meds:  amLODipine  10 mg Oral Daily   aspirin  81 mg Oral Daily   brimonidine  1 drop Both Eyes BID   carvedilol  12.5 mg Oral BID WC   dorzolamide  1 drop Both Eyes BID   heparin  5,000 Units Subcutaneous Q8H   hydrALAZINE  50 mg Oral Q8H   insulin aspart  0-5 Units Subcutaneous QHS   insulin aspart  0-9 Units Subcutaneous TID WC   insulin aspart  2 Units Subcutaneous TID WC   insulin glargine-yfgn  10 Units Subcutaneous QHS   latanoprost  1 drop Both Eyes QHS   pantoprazole  40 mg Oral Daily   pregabalin  75 mg Oral Daily   sertraline  50 mg Oral QODAY   sodium bicarbonate  1,300 mg Oral BID   tamsulosin  0.8 mg Oral QPM   Continuous Infusions:   LOS: 14 days   Time spent= 35 mins    Ammy Lienhard Arsenio Loader, MD [facility]  If 7PM-7AM, please contact night-coverage  04/01/2022, 11:26 AM

## 2022-04-01 NOTE — Progress Notes (Signed)
Palliative Medicine Progress Note   Patient Name: [name]       Date: 04/01/2022 DOB: [birth year]  Age: 62 y.o. MRN#: [id number] Attending Physician: Damita Lack, MD Primary Care Physician: Jodi Marble, MD Admit Date: 03/17/2022  Reason for Consultation/Follow-up: {Reason for Consult:23484}  HPI/Patient Profile: 62 y.o. male  with past medical history of HFpEF, CKD stage 4, peripheral vascular disease s/p Left BKA, previous CVA 10/2019, and anemia who presented to the ED on 03/17/2022 with shortness of breath. He was admitted for acute hypoxic respiratory failure and decompensated HFpEF in the setting of probable hepatorenal syndrome.   Subjective: ***  Objective:  Physical Exam          Vital Signs: BP 137/79 (BP Location: Left Arm)   Pulse 68   Temp (!) 97.4 F (36.3 C) (Oral)   Resp 18   Ht '5\' 8"'$  (1.727 m)   Wt 106.1 kg   SpO2 100%   BMI 35.57 kg/m  SpO2: SpO2: 100 % O2 Device: O2 Device: Room Air    LBM: Last BM Date : 03/31/22     Palliative Assessment/Data: ***     Palliative Medicine Assessment & Plan   Assessment: Principal Problem:   Acute on chronic diastolic CHF (congestive heart failure) (HCC) Active Problems:   Essential hypertension   Type 2 diabetes mellitus with stage 4 chronic kidney disease and hypertension (HCC)   Morbid (severe) obesity due to excess calories (HCC)   CKD (chronic kidney disease) stage 4, GFR 15-29 ml/min (HCC)   CVA (HCC) with residual right-sided weakness   History of left below knee amputation ([place])   Acute respiratory failure with hypoxia (HCC)   Need for emotional support   Counseling and coordination of care   History of CVA (cerebrovascular accident)   Palliative care encounter   DNR (do not  resuscitate)    Recommendations/Plan: ***  Goals of Care and Additional Recommendations: Limitations on Scope of Treatment: {Recommended Scope and Preferences:21019}  Code Status:   Prognosis:  {Palliative Care Prognosis:23504}  Discharge Planning: {Palliative dispostion:23505}  Care plan was discussed with ***  Thank you for allowing the Palliative Medicine Team to assist in the care of this patient.   ***   Lavena Bullion, NP   Please contact Palliative Medicine Team phone at  [phone] for questions and concerns.  For individual providers, please see AMION.

## 2022-04-02 DIAGNOSIS — I5033 Acute on chronic diastolic (congestive) heart failure: Secondary | ICD-10-CM | POA: Diagnosis not present

## 2022-04-02 LAB — MAGNESIUM: Magnesium: 2.2 mg/dL (ref 1.7–2.4)

## 2022-04-02 LAB — BASIC METABOLIC PANEL
Anion gap: 11 (ref 5–15)
BUN: 99 mg/dL — ABNORMAL HIGH (ref 6–20)
CO2: 18 mmol/L — ABNORMAL LOW (ref 22–32)
Calcium: 8.2 mg/dL — ABNORMAL LOW (ref 8.9–10.3)
Chloride: 106 mmol/L (ref 98–111)
Creatinine, Ser: 4.14 mg/dL — ABNORMAL HIGH (ref 0.61–1.24)
GFR, Estimated: 16 mL/min — ABNORMAL LOW (ref >60)
Glucose, Bld: 118 mg/dL — ABNORMAL HIGH (ref 70–99)
Potassium: 3.3 mmol/L — ABNORMAL LOW (ref 3.5–5.1)
Sodium: 135 mmol/L (ref 135–145)

## 2022-04-02 LAB — CBC
HCT: 26.7 % — ABNORMAL LOW (ref 39.0–52.0)
Hemoglobin: 8.1 g/dL — ABNORMAL LOW (ref 13.0–17.0)
MCH: 26.2 pg (ref 26.0–34.0)
MCHC: 30.3 g/dL (ref 30.0–36.0)
MCV: 86.4 fL (ref 80.0–100.0)
Platelets: 234 10*3/uL (ref 150–400)
RBC: 3.09 MIL/uL — ABNORMAL LOW (ref 4.22–5.81)
RDW: 18.6 % — ABNORMAL HIGH (ref 11.5–15.5)
WBC: 3.8 10*3/uL — ABNORMAL LOW (ref 4.0–10.5)
nRBC: 0 % (ref 0.0–0.2)

## 2022-04-02 LAB — GLUCOSE, CAPILLARY
Glucose-Capillary: 117 mg/dL — ABNORMAL HIGH (ref 70–99)
Glucose-Capillary: 137 mg/dL — ABNORMAL HIGH (ref 70–99)
Glucose-Capillary: 140 mg/dL — ABNORMAL HIGH (ref 70–99)

## 2022-04-02 MED ORDER — CARVEDILOL 12.5 MG PO TABS: 12.5000 mg | ORAL_TABLET | Freq: Two times a day (BID) | ORAL | Status: DC

## 2022-04-02 MED ORDER — FUROSEMIDE 40 MG PO TABS: 40.0000 mg | ORAL_TABLET | Freq: Two times a day (BID) | ORAL | 11 refills | Status: DC | PRN

## 2022-04-02 MED ORDER — BASAGLAR KWIKPEN 100 UNIT/ML ~~LOC~~ SOPN: 10.0000 [IU] | PEN_INJECTOR | Freq: Every day | SUBCUTANEOUS | Status: DC

## 2022-04-02 MED ORDER — HYDRALAZINE HCL 50 MG PO TABS: 50.0000 mg | ORAL_TABLET | Freq: Three times a day (TID) | ORAL | Status: DC

## 2022-04-02 NOTE — Progress Notes (Signed)
[facility] NEPHROLOGY PROGRESS NOTE   Subjective:  pt met w/ palliative care yesterday, would not want dialysis if kidney failure progressed. Pt was made DNR yesterday.   UOP 1 L yest, net negative 140 ml for the day.    Summary- Pt is a 62 y.o. yo male with history of CHF, DM, peripheral vascular disease status post left BKA, stroke, HTN, BPH, CKD stage IV, presented with shortness of breath and hypoxia seen as a consultation for AKI on CKD.   Objective Vital signs in last 24 hours: Vitals:   04/01/22 1352 04/01/22 2055 04/02/22 0447 04/02/22 0500  BP: 137/79 137/82 130/83   Pulse: 68 68 70   Resp: '18 19 18   '$ Temp: (!) 97.4 F (36.3 C) 98.7 F (37.1 C) 98.1 F (36.7 C)   TempSrc: Oral Oral Oral   SpO2: 100% 100% 98%   Weight:    121.5 kg  Height:       Physical Exam: General:NAD, looks comfortable but somewhat somnolent. Heart:RRR, s1s2 nl Lungs: Clear b/l, no crackle Abdomen:soft, Non-tender, non-distended Extremities: Lower extremity pitting edema presented up to thigh, chronic skin changes, trace stump edema. Neurology: Alert awake and following commands.    Assessment/ Plan: # AKI on CKD IV - w/ baseline creatinine level 2.5-2.6.  AKI thought to be due to decompensated CHF with anasarca/pulm edema.  He was initially treated with IV diuretics which was later discontinued (1/30) because of continue rising BUN and creatinine level.  He now has persistent urinary retention for which he is refusing Foley catheter this is exacerbating worsening renal function. He has peripheral edema up to his thigh and needs diuretics but we did not restart diuretics as it will worsen the urine retention. Pt aware that w/o Foley catheter the kidney function could continue to decline. Pt met w/ palliative care yesterday, pt would not want dialysis if his kidneys continued to fail. Pt was made full DNR as well. Creatinine has leveled off around 4.0 and UOP is good. Overall  prognosis is very poor from a renal standpoint, but there is nothing further to be done from our standpoint. If renal failure progresses, hospice care should be used. [place] for discharge, would use prn '40mg'$  po lasix bid PRN for swelling/ edema. Have d/w pmd.   # Acute urinary retention - patient adamant about not having intraurethral catheter.    # Hypertension: Blood pressure acceptable  # Acute respiratory failure with hypoxia due to acute pulmonary edema/CHF - on admission. This resolved w/ initial round of diuretics. Currently on room air  # Anemia partly due to CKD.  Received IV iron twice.  Received Aranesp on 1/30.  PRBC transfusion on 2/3.  Kelly Splinter, MD 04/02/2022, 8:21 AM  Recent Labs  Lab 03/27/22 0805 03/28/22 0800 03/29/22 0709 03/30/22 1152 03/31/22 0453 04/01/22 0719  HGB 7.2*  --   --  7.4*  --   --   ALBUMIN 3.0* 3.2*  --   --   --   --   CALCIUM 7.8* 8.3*   < > 8.2* 8.4* 8.5*  CREATININE 3.57* 3.78*   < > 4.12* 4.08* 4.01*  K 3.4* 3.8   < > 3.5 3.3* 3.6   < > = values in this interval not displayed.     Inpatient medications:  amLODipine  10 mg Oral Daily   aspirin  81 mg Oral Daily   brimonidine  1 drop Both Eyes BID   carvedilol  12.5 mg  Oral BID WC   dorzolamide  1 drop Both Eyes BID   heparin  5,000 Units Subcutaneous Q8H   hydrALAZINE  50 mg Oral Q8H   insulin aspart  0-5 Units Subcutaneous QHS   insulin aspart  0-9 Units Subcutaneous TID WC   insulin aspart  2 Units Subcutaneous TID WC   insulin glargine-yfgn  10 Units Subcutaneous QHS   latanoprost  1 drop Both Eyes QHS   pantoprazole  40 mg Oral Daily   pregabalin  75 mg Oral Daily   sertraline  50 mg Oral QODAY   sodium bicarbonate  1,300 mg Oral BID   tamsulosin  0.8 mg Oral QPM    acetaminophen **OR** acetaminophen, guaiFENesin, hydrALAZINE, ipratropium-albuterol, metoprolol tartrate, nitroGLYCERIN, ondansetron **OR** ondansetron (ZOFRAN) IV, senna-docusate, traZODone

## 2022-04-02 NOTE — Discharge Summary (Signed)
Physician Discharge Summary  ALUCARD FEARNOW ZMO:[id number] DOB: 03/17/1960 DOA: 03/17/2022  PCP: Jodi Marble, MD  Admit date: 03/17/2022 Discharge date: 04/02/2022  Admitted From: [facility] Disposition: [facility]  Recommendations for Outpatient Follow-up:  Follow up with PCP in 1-2 weeks Please obtain BMP/CBC in one week  Patient started on Coreg 12.5 mg twice daily.  Discontinue clonidine Long-acting insulin reduced to 10 units daily Discontinue potassium and magnesium supplements.  Patient will be on Lasix 40 mg twice daily as needed for increasing swelling and shortness of breath. Hydralazine increased to 50 mg 3 times daily   Discharge Condition: Stable CODE STATUS: DNR Diet recommendation: Renal  Brief/Interim Summary: 62 y.o. male past medical history of chronic diastolic heart failure, chronic kidney disease stage IV, diabetes mellitus type 2, peripheral vascular disease with a BKA brought in for shortness of breath, CT of the chest showed groundglass opacitie.  Patient was found to have pulmonary edema requiring diuresis which has been managed by nephrology team.  Patient is retaining quite a bit of urine but adamantly refusing any straight cath or Foley catheter placement despite of me explaining him the benefit of this.  At this time patient's kidney continues to worsen, he has been seen by palliative care service as well.  Patient was made DNR.  Eventually creatinine stabilized around 4.1.  He was cleared by nephrology for discharge with recommendations as stated above.  He will be on Lasix 40 mg twice daily to be taken as needed for increasing swelling/edema and shortness of breath. Follow-up outpatient PCP and nephrology.     Assessment & Plan:  Principal Problem:   Acute on chronic diastolic CHF (congestive heart failure) (HCC) Active Problems:   Acute respiratory failure with hypoxia (HCC)   Type 2 diabetes mellitus with stage 4 chronic kidney disease  and hypertension (HCC)   Morbid (severe) obesity due to excess calories (HCC)   CKD (chronic kidney disease) stage 4, GFR 15-29 ml/min (HCC)   CVA (HCC) with residual right-sided weakness   History of left below knee amputation ([place])   Essential hypertension   Need for emotional support   Counseling and coordination of care   History of CVA (cerebrovascular accident)   Palliative care encounter   DNR (do not resuscitate)         Acute respiratory failure with hypoxia secondary to pulmonary edema/chronic diastolic heart failure: Resolved, stable initially required diuresis.  Nephrology is following.   Acute kidney injury chronic kidney disease stage IV Metabolic acidosis Uremia Creatinine was around 3.0-3.5.  Cont bicarbonate tablets.  Nephrology following.  Creatinine appears to have stabilized around 4.1.  This is likely new baseline.  No urgent indication for dialysis.  Continue bicarb.  Cleared by nephrology for discharge.  Lasix 40 mg twice daily as needed to be taken Renal US- medical dz.  Patient is having urinary retention but has adamantly refused Foley catheter or straight cath   Essential hypertension: On Norvasc, Coreg.  Discontinued clonidine   Chest pain: Resolved Resolved   Peripheral vascular disease/diabetes mellitus type 2: With an A1c of 6.1. Long-acting.  Sliding scale Accu-Chek.  His long-acting insulin has been reduced to 10 units daily. Continue Lyrica   Normocytic anemia: Continue to monitor.  Received Aranesp.  Hemoglobin 7.4, 1 unit PRBC transfusion.  Hb now stable at Stable 8.1.   Leukopenia: Borderline we will need to follow-up as an outpatient with PCP.  Appears to be stable for now   Patient has  been seen by palliative care team given his comorbidities.  Appreciate their input.  Patient is now DNR/DNI.      Discharge Diagnoses:       Consultations: Nephrology Palliative care  Subjective: No complaints doing okay.  Patient would  like to be discharged  Discharge Exam: Vitals:   04/02/22 1211 04/02/22 1346  BP: (!) 145/85 (!) 162/91  Pulse: 71 74  Resp: 18 18  Temp: 98.3 F (36.8 C) (!) 97.4 F (36.3 C)  SpO2: 98% 100%   Vitals:   04/02/22 0447 04/02/22 0500 04/02/22 1211 04/02/22 1346  BP: 130/83  (!) 145/85 (!) 162/91  Pulse: 70  71 74  Resp: '18  18 18  '$ Temp: 98.1 F (36.7 C)  98.3 F (36.8 C) (!) 97.4 F (36.3 C)  TempSrc: Oral  Oral Oral  SpO2: 98%  98% 100%  Weight:  121.5 kg    Height:        General: Pt is alert, awake, not in acute distress Cardiovascular: RRR, S1/S2 +, no rubs, no gallops Respiratory: CTA bilaterally, no wheezing, no rhonchi Abdominal: Soft, NT, ND, bowel sounds + Extremities: Chronic lower extremity edema Left-sided BKA  Discharge Instructions   Allergies as of 04/02/2022       Reactions   Other Anaphylaxis, Itching, Swelling, Other (See Comments)   Grits - swelling        Medication List     STOP taking these medications    cloNIDine 0.1 MG tablet Commonly known as: CATAPRES   Magnesium Oxide 400 MG Caps   potassium chloride SA 20 MEQ tablet Commonly known as: KLOR-CON M       TAKE these medications    acetaminophen 650 MG CR tablet Commonly known as: TYLENOL Take 650 mg by mouth every 4 (four) hours as needed for pain.   albuterol 108 (90 Base) MCG/ACT inhaler Commonly known as: VENTOLIN HFA Inhale 2 puffs into the lungs every 6 (six) hours as needed for wheezing or shortness of breath.   amLODipine 10 MG tablet Commonly known as: NORVASC Take 1 tablet (10 mg total) by mouth daily.   ascorbic acid 500 MG tablet Commonly known as: VITAMIN C Take 500 mg by mouth 2 (two) times daily.   aspirin 81 MG chewable tablet Chew 81 mg by mouth daily.   atorvastatin 80 MG tablet Commonly known as: LIPITOR Take 80 mg by mouth at bedtime.   Basaglar KwikPen 100 UNIT/ML Inject 10 Units into the skin daily. What changed: how much to take    brimonidine 0.2 % ophthalmic solution Commonly known as: ALPHAGAN Place 1 drop into both eyes 2 (two) times daily.   carvedilol 12.5 MG tablet Commonly known as: COREG Take 1 tablet (12.5 mg total) by mouth 2 (two) times daily with a meal.   docusate sodium 100 MG capsule Commonly known as: COLACE Take 100 mg by mouth at bedtime.   dorzolamide 2 % ophthalmic solution Commonly known as: TRUSOPT Place 1 drop into both eyes 2 (two) times daily.   feeding supplement (NEPRO CARB STEADY) Liqd Take 237 mLs by mouth at bedtime.   ferrous sulfate 325 (65 FE) MG tablet Take 1 tablet (325 mg total) by mouth daily.   furosemide 80 MG tablet Commonly known as: LASIX Take 1 tablet (80 mg total) by mouth 2 (two) times daily. What changed: Another medication with the same name was added. Make sure you understand how and when to take each.   furosemide 40  MG tablet Commonly known as: Lasix Take 1 tablet (40 mg total) by mouth 2 (two) times daily as needed for edema or fluid. What changed: You were already taking a medication with the same name, and this prescription was added. Make sure you understand how and when to take each.   hydrALAZINE 50 MG tablet Commonly known as: APRESOLINE Take 1 tablet (50 mg total) by mouth every 8 (eight) hours. What changed:  medication strength how much to take when to take this   latanoprost 0.005 % ophthalmic solution Commonly known as: XALATAN Place 1 drop into both eyes at bedtime.   multivitamin tablet Take 1 tablet by mouth daily.   NovoLOG FlexPen 100 UNIT/ML FlexPen Generic drug: insulin aspart Inject 2-10 Units into the skin 3 (three) times daily with meals. Inject per sliding scale   ondansetron 4 MG tablet Commonly known as: ZOFRAN Take 4 mg by mouth every 4 (four) hours as needed for nausea or vomiting.   pantoprazole 40 MG tablet Commonly known as: PROTONIX Take 1 tablet (40 mg total) by mouth daily.   pregabalin 75 MG  capsule Commonly known as: LYRICA Take 1 capsule (75 mg total) by mouth daily.   PROTEIN PO Take 30 mLs by mouth 2 (two) times daily.   saccharomyces boulardii 250 MG capsule Commonly known as: FLORASTOR Take 1 capsule (250 mg total) by mouth 2 (two) times daily.   sertraline 50 MG tablet Commonly known as: ZOLOFT Take 50 mg by mouth every other day.   sodium bicarbonate 650 MG tablet Take 2 tablets (1,300 mg total) by mouth 2 (two) times daily.   tamsulosin 0.4 MG Caps capsule Commonly known as: FLOMAX Take 0.8 mg by mouth every evening.   Vitamin D (Ergocalciferol) 1.25 MG (50000 UNIT) Caps capsule Commonly known as: DRISDOL Take 50,000 Units by mouth every 30 (thirty) days. 17th of each month   zinc sulfate 220 (50 Zn) MG capsule Take 220 mg by mouth daily.        Follow-up Information     Jodi Marble, MD Follow up in 1 week(s).   Specialty: Internal Medicine Contact information: [place] [address] [phone]                Allergies  Allergen Reactions   Other Anaphylaxis, Itching, Swelling and Other (See Comments)    Grits - swelling    You were cared for by a hospitalist during your hospital stay. If you have any questions about your discharge medications or the care you received while you were in the hospital after you are discharged, you can call the unit and asked to speak with the hospitalist on call if the hospitalist that took care of you is not available. Once you are discharged, your primary care physician will handle any further medical issues. Please note that no refills for any discharge medications will be authorized once you are discharged, as it is imperative that you return to your primary care physician (or establish a relationship with a primary care physician if you do not have one) for your aftercare needs so that they can reassess your need for medications and monitor your lab  values.   Procedures/Studies: DG CHEST PORT 1 VIEW  Result Date: 03/23/2022 CLINICAL DATA:  Follow-up exam.  Anasarca.  Chronic renal disease. EXAM: PORTABLE CHEST 1 VIEW COMPARISON:  March 17, 2022 FINDINGS: Improving bilateral pulmonary infiltrates. Cardiomegaly. The hila and mediastinum are unchanged. No pneumothorax. No other  acute abnormalities. IMPRESSION: Improving bilateral pulmonary opacities thought to represent pulmonary edema on the March 17, 2022 CT scan of the chest. Electronically Signed   By: Dorise Bullion III M.D.   On: 03/23/2022 13:18   US RENAL  Result Date: 03/20/2022 CLINICAL DATA:  Acute renal failure superimposed on chronic kidney disease. EXAM: RENAL / URINARY TRACT ULTRASOUND COMPLETE COMPARISON:  Renal ultrasound 10/23/2021 FINDINGS: Right Kidney: Renal measurements: 11.5 x 5.9 x 6.1 cm = volume: 216 mL. Echogenicity is mildly increased. No mass or hydronephrosis visualized. Left Kidney: Renal measurements: 11.3 x 6.6 x 6.6 cm = volume: 260 mL. Echogenicity is mildly increased. No mass or hydronephrosis visualized. Bladder: Appears normal for degree of bladder distention. Bilateral ureteral jets are visualized. Other: None. IMPRESSION: Mildly increased echogenicity of the bilateral kidneys, as can be seen in medical renal disease. No hydronephrosis. Electronically Signed   By: Ronney Asters M.D.   On: 03/20/2022 15:32   ECHOCARDIOGRAM COMPLETE  Result Date: 03/18/2022    ECHOCARDIOGRAM REPORT   Patient Name:   [name] Date of Exam: 03/18/2022 Medical Rec #:  [id number]     Height:       68.0 in Accession #:    [id number]    Weight:       235.4 lb Date of Birth:  Jun 29, 1960      BSA:          2.191 m Patient Age:    62 years      BP:           182/101 mmHg Patient Gender: M             HR:           77 bpm. Exam Location:  Inpatient Procedure: 2D Echo, Cardiac Doppler and Color Doppler Indications:    J62.83 Acute diastolic (congestive) heart failure  History:         Patient has prior history of Echocardiogram examinations. CHF;                 Risk Factors:Diabetes and Hypertension.  Sonographer:    Phineas Douglas Referring Phys: [phone] [place]  1. Left ventricular ejection fraction, by estimation, is 50 to 55%. The left ventricle has low normal function. The left ventricle has no regional wall motion abnormalities. There is moderate concentric left ventricular hypertrophy. Left ventricular diastolic parameters are consistent with Grade II diastolic dysfunction (pseudonormalization). Elevated left ventricular end-diastolic pressure.  2. Right ventricular systolic function is normal. The right ventricular size is normal. There is normal pulmonary artery systolic pressure.  3. The mitral valve is normal in structure. Trivial mitral valve regurgitation. No evidence of mitral stenosis.  4. The aortic valve is calcified. There is mild calcification of the aortic valve. There is mild thickening of the aortic valve. Aortic valve regurgitation is mild. Mild aortic valve stenosis. Aortic valve area, by VTI measures 1.06 cm. Aortic valve mean gradient measures 11.3 mmHg. Aortic valve Vmax measures 2.23 m/s.  5. The inferior vena cava is normal in size with greater than 50% respiratory variability, suggesting right atrial pressure of 3 mmHg. FINDINGS  Left Ventricle: Left ventricular ejection fraction, by estimation, is 50 to 55%. The left ventricle has low normal function. The left ventricle has no regional wall motion abnormalities. The left ventricular internal cavity size was normal in size. There is moderate concentric left ventricular hypertrophy. Left ventricular diastolic parameters are consistent with Grade II diastolic dysfunction (pseudonormalization). Elevated left  ventricular end-diastolic pressure. Right Ventricle: The right ventricular size is normal. No increase in right ventricular wall thickness. Right ventricular systolic function is normal. There is normal  pulmonary artery systolic pressure. The tricuspid regurgitant velocity is 2.71 m/s, and  with an assumed right atrial pressure of 3 mmHg, the estimated right ventricular systolic pressure is 05.3 mmHg. Left Atrium: Left atrial size was normal in size. Right Atrium: Right atrial size was normal in size. Pericardium: There is no evidence of pericardial effusion. Mitral Valve: The mitral valve is normal in structure. Trivial mitral valve regurgitation. No evidence of mitral valve stenosis. Tricuspid Valve: The tricuspid valve is normal in structure. Tricuspid valve regurgitation is trivial. No evidence of tricuspid stenosis. Aortic Valve: The aortic valve is calcified. There is mild calcification of the aortic valve. There is mild thickening of the aortic valve. Aortic valve regurgitation is mild. Mild aortic stenosis is present. Aortic valve mean gradient measures 11.3 mmHg. Aortic valve peak gradient measures 20.0 mmHg. Aortic valve area, by VTI measures 1.06 cm. Pulmonic Valve: The pulmonic valve was normal in structure. Pulmonic valve regurgitation is not visualized. No evidence of pulmonic stenosis. Aorta: The aortic root is normal in size and structure. Venous: The inferior vena cava is normal in size with greater than 50% respiratory variability, suggesting right atrial pressure of 3 mmHg. IAS/Shunts: No atrial level shunt detected by color flow Doppler.  LEFT VENTRICLE PLAX 2D LVIDd:         4.10 cm      Diastology LVIDs:         3.00 cm      LV e' medial:    4.30 cm/s LV PW:         1.20 cm      LV E/e' medial:  24.9 LV IVS:        1.40 cm      LV e' lateral:   6.81 cm/s LVOT diam:     2.00 cm      LV E/e' lateral: 15.7 LV SV:         51 LV SV Index:   23 LVOT Area:     3.14 cm  LV Volumes (MOD) LV vol d, MOD A2C: 121.0 ml LV vol d, MOD A4C: 132.0 ml LV vol s, MOD A2C: 47.0 ml LV vol s, MOD A4C: 60.3 ml LV SV MOD A2C:     74.0 ml LV SV MOD A4C:     132.0 ml LV SV MOD BP:      74.0 ml RIGHT VENTRICLE RV S  prime:     10.10 cm/s TAPSE (M-mode): 2.0 cm LEFT ATRIUM             Index LA diam:        4.70 cm 2.15 cm/m LA Vol (A2C):   44.4 ml 20.27 ml/m LA Vol (A4C):   68.7 ml 31.36 ml/m LA Biplane Vol: 58.8 ml 26.84 ml/m  AORTIC VALVE AV Area (Vmax):    1.10 cm AV Area (Vmean):   1.12 cm AV Area (VTI):     1.06 cm AV Vmax:           223.33 cm/s AV Vmean:          155.000 cm/s AV VTI:            0.481 m AV Peak Grad:      20.0 mmHg AV Mean Grad:      11.3 mmHg LVOT Vmax:  78.30 cm/s LVOT Vmean:        55.400 cm/s LVOT VTI:          0.163 m LVOT/AV VTI ratio: 0.34  AORTA Ao Root diam: 3.20 cm Ao Asc diam:  3.30 cm MITRAL VALVE                TRICUSPID VALVE MV Area (PHT): 3.27 cm     TR Peak grad:   29.4 mmHg MV Decel Time: 232 msec     TR Vmax:        271.00 cm/s MV E velocity: 107.00 cm/s MV A velocity: 102.00 cm/s  SHUNTS MV E/A ratio:  1.05         Systemic VTI:  0.16 m                             Systemic Diam: 2.00 cm Skeet Latch MD Electronically signed by Skeet Latch MD Signature Date/Time: 03/18/2022/11:08:06 PM    Final    VAS [place] UPPER EXTREMITY VENOUS DUPLEX  Result Date: 03/18/2022 UPPER VENOUS STUDY  Patient Name:  TABITHA TUPPER  Date of Exam:   03/18/2022 Medical Rec #: [id number]      Accession #:    [id number] Date of Birth: September 24, 1960       Patient Gender: M Patient Age:   57 years Exam Location:  [facility] Procedure:      VAS [place] UPPER EXTREMITY VENOUS DUPLEX Referring Phys: Nita Sells --------------------------------------------------------------------------------  Indications: Swelling Risk Factors: None identified. Limitations: Poor ultrasound/tissue interface. Comparison Study: No prior studies. Performing Technologist: Oliver Hum RVT  Examination Guidelines: A complete evaluation includes B-mode imaging, spectral Doppler, color Doppler, and power Doppler as needed of all accessible portions of each vessel. Bilateral testing is considered an integral part  of a complete examination. Limited examinations for reoccurring indications may be performed as noted.  Right Findings: +----------+------------+---------+-----------+----------+-------+ RIGHT     CompressiblePhasicitySpontaneousPropertiesSummary +----------+------------+---------+-----------+----------+-------+ IJV           Full       Yes       Yes                      +----------+------------+---------+-----------+----------+-------+ Subclavian    Full       Yes       Yes                      +----------+------------+---------+-----------+----------+-------+ Axillary      Full       Yes       Yes                      +----------+------------+---------+-----------+----------+-------+ Brachial      Full       Yes       Yes                      +----------+------------+---------+-----------+----------+-------+ Radial        Full                                          +----------+------------+---------+-----------+----------+-------+ Ulnar         Full                                          +----------+------------+---------+-----------+----------+-------+  Cephalic      Full                                          +----------+------------+---------+-----------+----------+-------+ Basilic       Full                                          +----------+------------+---------+-----------+----------+-------+  Left Findings: +----------+------------+---------+-----------+----------+-------+ LEFT      CompressiblePhasicitySpontaneousPropertiesSummary +----------+------------+---------+-----------+----------+-------+ Subclavian    Full       Yes       Yes                      +----------+------------+---------+-----------+----------+-------+  Summary:  Right: No evidence of deep vein thrombosis in the upper extremity. No evidence of superficial vein thrombosis in the upper extremity.  Left: No evidence of thrombosis in the subclavian.  *See  table(s) above for measurements and observations.  Diagnosing physician: Harold Barban MD Electronically signed by Harold Barban MD on 03/18/2022 at 11:33:14 AM.    Final    CT Chest Wo Contrast  Result Date: 03/17/2022 CLINICAL DATA:  Pneumonia. EXAM: CT CHEST WITHOUT CONTRAST TECHNIQUE: Multidetector CT imaging of the chest was performed following the standard protocol without IV contrast. RADIATION DOSE REDUCTION: This exam was performed according to the departmental dose-optimization program which includes automated exposure control, adjustment of the mA and/or kV according to patient size and/or use of iterative reconstruction technique. COMPARISON:  Chest x-ray same day.  CT angiogram chest 10/19/2021. FINDINGS: Cardiovascular: Heart is mildly enlarged. There is a small pericardial effusion. Aorta is normal in size. There are atherosclerotic calcifications of the aorta. Mediastinum/Nodes: Multiple thyroid nodules are present bilaterally measuring up to 2.2 cm on the left image 2/17, similar to prior. No definite enlarged mediastinal, hilar or axillary lymph nodes allowing for lack of intravenous contrast. The esophagus is nondilated. Lungs/Pleura: There are diffuse patchy ground-glass opacities throughout both lungs most significant in the bilateral upper lobes. Additional upper lobe airspace opacities are present. There is some smooth interlobular septal thickening predominantly in the upper lobes. There are trace bilateral pleural effusions. There is a 6 mm nodular density in the left upper lobe image 7/71, new from prior. No pneumothorax. Trachea and central airways patent as can be seen. Upper Abdomen: Gallstone present. Musculoskeletal: No acute fractures are seen. There is bilateral gynecomastia and mild body wall edema. IMPRESSION: 1. Diffuse patchy ground-glass opacities throughout both lungs most significant in the bilateral upper lobes. There is some smooth interlobular septal thickening in the  upper lobes. Findings are favored as infectious/inflammatory process. 2. Trace bilateral pleural effusions. 3. 6 mm left solid pulmonary nodule within the upper lobe. Per Fleischner Society Guidelines, recommend a non-contrast Chest CT at 6-12 months. If patient is high risk for malignancy, consider an additional non-contrast Chest CT at 18-24 months. If patient is low risk for malignancy, non-contrast Chest CT at 18-24 months is optional. These guidelines do not apply to immunocompromised patients and patients with cancer. Follow up in patients with significant comorbidities as clinically warranted. For lung cancer screening, adhere to Lung-RADS guidelines. Reference: Radiology. 2017; 284(1):228-43. 4. Mild cardiomegaly with small pericardial effusion. 5. Body wall edema. 6. Cholelithiasis. 7. Thyroid nodules measuring up to 2.2 cm. Follow-up ultrasound recommended. Aortic  Atherosclerosis (ICD10-I70.0). Electronically Signed   By: Ronney Asters M.D.   On: 03/17/2022 16:26   DG Chest Portable 1 View  Result Date: 03/17/2022 CLINICAL DATA:  Shortness of breath. EXAM: PORTABLE CHEST 1 VIEW COMPARISON:  12/08/2021 FINDINGS: 1459 hours. Low volume film. The cardio pericardial silhouette is enlarged. Diffuse hazy bilateral airspace opacity noted with some sparing at the right base. The visualized bony structures of the thorax are unremarkable. Telemetry leads overlie the chest. IMPRESSION: Low volume film with diffuse hazy bilateral airspace opacity. Imaging features suggest pulmonary edema although diffuse infection not excluded. Electronically Signed   By: Misty Stanley M.D.   On: 03/17/2022 15:09     The results of significant diagnostics from this hospitalization (including imaging, microbiology, ancillary and laboratory) are listed below for reference.     Microbiology: No results found for this or any previous visit (from the past 240 hour(s)).   Labs: BNP (last 3 results) Recent Labs     03/17/22 1440 03/18/22 0404 03/27/22 0805  BNP 370.1* 575.9* 244.0*   Basic Metabolic Panel: Recent Labs  Lab 03/29/22 0709 03/30/22 1152 03/31/22 0453 04/01/22 0719 04/02/22 0811  NA 139 139 138 137 135  K 3.7 3.5 3.3* 3.6 3.3*  CL 108 108 108 106 106  CO2 20* 20* 20* 19* 18*  GLUCOSE 116* 137* 113* 96 118*  BUN 88* 94* 100* 91* 99*  CREATININE 3.91* 4.12* 4.08* 4.01* 4.14*  CALCIUM 8.4* 8.2* 8.4* 8.5* 8.2*  MG 2.1 2.0 2.0 2.2 2.2   Liver Function Tests: Recent Labs  Lab 03/27/22 0805 03/28/22 0800  ALBUMIN 3.0* 3.2*   No results for input(s): "LIPASE", "AMYLASE" in the last 168 hours. No results for input(s): "AMMONIA" in the last 168 hours. CBC: Recent Labs  Lab 03/27/22 0805 03/30/22 1152 04/02/22 0811  WBC 3.2* 3.9* 3.8*  HGB 7.2* 7.4* 8.1*  HCT 23.4* 24.7* 26.7*  MCV 83.0 86.1 86.4  PLT 184 203 234   Cardiac Enzymes: No results for input(s): "CKTOTAL", "CKMB", "CKMBINDEX", "TROPONINI" in the last 168 hours. BNP: Invalid input(s): "POCBNP" CBG: Recent Labs  Lab 04/01/22 1122 04/01/22 1636 04/01/22 2145 04/02/22 0742 04/02/22 1224  GLUCAP 130* 155* 133* 117* 137*   D-Dimer No results for input(s): "DDIMER" in the last 72 hours. Hgb A1c No results for input(s): "HGBA1C" in the last 72 hours. Lipid Profile No results for input(s): "CHOL", "HDL", "LDLCALC", "TRIG", "CHOLHDL", "LDLDIRECT" in the last 72 hours. Thyroid function studies No results for input(s): "TSH", "T4TOTAL", "T3FREE", "THYROIDAB" in the last 72 hours.  Invalid input(s): "FREET3" Anemia work up No results for input(s): "VITAMINB12", "FOLATE", "FERRITIN", "TIBC", "IRON", "RETICCTPCT" in the last 72 hours. Urinalysis    Component Value Date/Time   COLORURINE STRAW (A) 03/20/2022 0817   APPEARANCEUR CLEAR 03/20/2022 0817   APPEARANCEUR Clear 12/31/2013 2116   LABSPEC 1.009 03/20/2022 0817   LABSPEC 1.032 12/31/2013 2116   PHURINE 5.0 03/20/2022 0817   GLUCOSEU 50 (A)  03/20/2022 0817   GLUCOSEU >=500 12/31/2013 2116   HGBUR NEGATIVE 03/20/2022 0817   BILIRUBINUR NEGATIVE 03/20/2022 0817   BILIRUBINUR Negative 12/31/2013 2116   KETONESUR NEGATIVE 03/20/2022 0817   PROTEINUR >=300 (A) 03/20/2022 0817   UROBILINOGEN 1.0 08/28/2013 1709   NITRITE NEGATIVE 03/20/2022 0817   LEUKOCYTESUR NEGATIVE 03/20/2022 0817   LEUKOCYTESUR Negative 12/31/2013 2116   Sepsis Labs Recent Labs  Lab 03/27/22 0805 03/30/22 1152 04/02/22 0811  WBC 3.2* 3.9* 3.8*   Microbiology No results found for  this or any previous visit (from the past 240 hour(s)).   Time coordinating discharge:  I have spent 35 minutes face to face with the patient and on the ward discussing the patients care, assessment, plan and disposition with other care givers. >50% of the time was devoted counseling the patient about the risks and benefits of treatment/Discharge disposition and coordinating care.   SIGNED:   Damita Lack, MD  [facility] 04/02/2022, 3:18 PM   If 7PM-7AM, please contact night-coverage

## 2022-04-02 NOTE — Progress Notes (Signed)
PROGRESS NOTE    [name]  VQM:[id number] DOB: 1960-07-03 DOA: 03/17/2022 PCP: Jodi Marble, MD   Brief Narrative:   62 y.o. male past medical history of chronic diastolic heart failure, chronic kidney disease stage IV, diabetes mellitus type 2, peripheral vascular disease with a BKA brought in for shortness of breath, CT of the chest showed groundglass opacitie.  Patient was found to have pulmonary edema requiring diuresis which has been managed by nephrology team.  Patient is retaining quite a bit of urine but adamantly refusing any straight cath or Foley catheter placement despite of me explaining him the benefit of this.  At this time patient's kidney continues to worsen, he has been seen by palliative care service as well.  Patient was made DNR.   Assessment & Plan:  Principal Problem:   Acute on chronic diastolic CHF (congestive heart failure) (HCC) Active Problems:   Acute respiratory failure with hypoxia (HCC)   Type 2 diabetes mellitus with stage 4 chronic kidney disease and hypertension (HCC)   Morbid (severe) obesity due to excess calories (HCC)   CKD (chronic kidney disease) stage 4, GFR 15-29 ml/min (HCC)   CVA (HCC) with residual right-sided weakness   History of left below knee amputation ([place])   Essential hypertension   Need for emotional support   Counseling and coordination of care   History of CVA (cerebrovascular accident)   Palliative care encounter   DNR (do not resuscitate)       Acute respiratory failure with hypoxia secondary to pulmonary edema/chronic diastolic heart failure: Initially required diuresis as guided by nephrology.   Acute kidney injury chronic kidney disease stage IV Metabolic acidosis Uremia Creatinine is around 3.0-3.5.  Cont bicarbonate tablets.  Nephrology following.  Cr 4.14, may have platued Renal US- medical dz.  Having urinary retention but adamantly has refusing Foley catheter or straight cath despite of worsening renal  function   Essential hypertension: On Norvasc, Coreg.  IV as needed   Chest pain: Resolved Improved with GI cocktail   Peripheral vascular disease/diabetes mellitus type 2: With an A1c of 6.1. Long-acting.  Sliding scale Accu-Chek.  Adjust as necessary. Continue Lyrica   Normocytic anemia: Continue to monitor.  Received Aranesp received a unit of PRBC.  Hemoglobin today is 8.1 and stable   Leukopenia: Borderline we will need to follow-up as an outpatient with PCP.  Patient has been seen by palliative care team given his comorbidities.  Appreciate their input.  Patient is now DNR/DNI.  PT/OT  DVT prophylaxis: Subcu heparin Code Status: Full code Family Communication: Brother updated periodically  Status is: Inpatient Remains inpatient appropriate because: Volume status slightly better.  Renal function still remains elevated.   Subjective: Seen and examined at bedside.  Ate his breakfast.  Did not have any complaints but just very frustrated with his own renal function.  Examination: Constitutional: Not in acute distress Respiratory: Clear to auscultation bilaterally Cardiovascular: Normal sinus rhythm, no rubs Abdomen: Nontender nondistended good bowel sounds Musculoskeletal: No edema noted Skin: No rashes seen Neurologic: CN 2-12 grossly intact.  And nonfocal Psychiatric: Normal judgment and insight. Alert and oriented x 3. Normal mood. Left-sided BKA External Foley catheter in place  Objective: Vitals:   04/01/22 1352 04/01/22 2055 04/02/22 0447 04/02/22 0500  BP: 137/79 137/82 130/83   Pulse: 68 68 70   Resp: '18 19 18   '$ Temp: (!) 97.4 F (36.3 C) 98.7 F (37.1 C) 98.1 F (36.7 C)   TempSrc: Oral  Oral Oral   SpO2: 100% 100% 98%   Weight:    121.5 kg  Height:        Intake/Output Summary (Last 24 hours) at 04/02/2022 1107 Last data filed at 04/02/2022 0900 Gross per 24 hour  Intake 1100 ml  Output 1201 ml  Net -101 ml   Filed Weights   03/30/22 0452  03/31/22 0454 04/02/22 0500  Weight: 104.2 kg 106.1 kg 121.5 kg     Data Reviewed:   CBC: Recent Labs  Lab 03/27/22 0805 03/30/22 1152 04/02/22 0811  WBC 3.2* 3.9* 3.8*  HGB 7.2* 7.4* 8.1*  HCT 23.4* 24.7* 26.7*  MCV 83.0 86.1 86.4  PLT 184 203 953   Basic Metabolic Panel: Recent Labs  Lab 03/29/22 0709 03/30/22 1152 03/31/22 0453 04/01/22 0719 04/02/22 0811  NA 139 139 138 137 135  K 3.7 3.5 3.3* 3.6 3.3*  CL 108 108 108 106 106  CO2 20* 20* 20* 19* 18*  GLUCOSE 116* 137* 113* 96 118*  BUN 88* 94* 100* 91* 99*  CREATININE 3.91* 4.12* 4.08* 4.01* 4.14*  CALCIUM 8.4* 8.2* 8.4* 8.5* 8.2*  MG 2.1 2.0 2.0 2.2 2.2   GFR: Estimated Creatinine Clearance: 24 mL/min (A) (by C-G formula based on SCr of 4.14 mg/dL (H)). Liver Function Tests: Recent Labs  Lab 03/27/22 0805 03/28/22 0800  ALBUMIN 3.0* 3.2*   No results for input(s): "LIPASE", "AMYLASE" in the last 168 hours. No results for input(s): "AMMONIA" in the last 168 hours. Coagulation Profile: No results for input(s): "INR", "PROTIME" in the last 168 hours. Cardiac Enzymes: No results for input(s): "CKTOTAL", "CKMB", "CKMBINDEX", "TROPONINI" in the last 168 hours. BNP (last 3 results) No results for input(s): "PROBNP" in the last 8760 hours. HbA1C: No results for input(s): "HGBA1C" in the last 72 hours. CBG: Recent Labs  Lab 04/01/22 0739 04/01/22 1122 04/01/22 1636 04/01/22 2145 04/02/22 0742  GLUCAP 92 130* 155* 133* 117*   Lipid Profile: No results for input(s): "CHOL", "HDL", "LDLCALC", "TRIG", "CHOLHDL", "LDLDIRECT" in the last 72 hours. Thyroid Function Tests: No results for input(s): "TSH", "T4TOTAL", "FREET4", "T3FREE", "THYROIDAB" in the last 72 hours. Anemia Panel: No results for input(s): "VITAMINB12", "FOLATE", "FERRITIN", "TIBC", "IRON", "RETICCTPCT" in the last 72 hours. Sepsis Labs: No results for input(s): "PROCALCITON", "LATICACIDVEN" in the last 168 hours.  No results found for  this or any previous visit (from the past 240 hour(s)).        Radiology Studies: No results found.      Scheduled Meds:  amLODipine  10 mg Oral Daily   aspirin  81 mg Oral Daily   brimonidine  1 drop Both Eyes BID   carvedilol  12.5 mg Oral BID WC   dorzolamide  1 drop Both Eyes BID   heparin  5,000 Units Subcutaneous Q8H   hydrALAZINE  50 mg Oral Q8H   insulin aspart  0-5 Units Subcutaneous QHS   insulin aspart  0-9 Units Subcutaneous TID WC   insulin aspart  2 Units Subcutaneous TID WC   insulin glargine-yfgn  10 Units Subcutaneous QHS   latanoprost  1 drop Both Eyes QHS   pantoprazole  40 mg Oral Daily   pregabalin  75 mg Oral Daily   sertraline  50 mg Oral QODAY   sodium bicarbonate  1,300 mg Oral BID   tamsulosin  0.8 mg Oral QPM   Continuous Infusions:   LOS: 15 days   Time spent= 35 mins    Yesha Muchow  Arsenio Loader, MD [facility]  If 7PM-7AM, please contact night-coverage  04/02/2022, 11:07 AM

## 2022-04-02 NOTE — Progress Notes (Signed)
[facility] arrived to transport patient. No changes from am assessment. Pt is alert oriented x4. Stable. Pt transported with belongings. A copy of DC instructions provided also. Report called to Tish at [facility]. She denied questions, or concerns.

## 2022-04-02 NOTE — TOC Transition Note (Addendum)
Transition of Care [facility]) - CM/SW Discharge Note   Patient Details  Name: [name] MRN: [id number] Date of Birth: 15-Mar-1960  [facility]) CM/SW Contact:  Vassie Moselle, [place] Phone Number: 04/02/2022, 3:22 PM   Clinical Narrative:    Pt to discharge and return to [facility] for [place]. Pt will be going to room 224A. RN to call report to [phone]. [facility] will be arranged for transportation. [facility] called at 3:50pm.    Final next level of care: Long Term Nursing Home Barriers to Discharge: Barriers Resolved   Patient Goals and CMS Choice CMS Medicare.gov Compare Post Acute Care list provided to:: Patient Choice offered to / list presented to : Patient  Discharge Placement                Patient chooses bed at: Other - please specify in the comment section below: [facility]) Patient to be transferred to facility by: [place] Name of family member notified: Patient Patient and family notified of of transfer: 04/02/22  Discharge Plan and Services Additional resources added to the After Visit Summary for       Post Acute Care Choice: [place]          DME Arranged: N/A DME Agency: NA                  Social Determinants of Health ([place]) Interventions SDOH Screenings   Food Insecurity: No Food Insecurity (03/18/2022)  Housing: Low Risk  (03/18/2022)  Transportation Needs: No Transportation Needs (03/18/2022)  Utilities: Not At Risk (03/18/2022)  Alcohol Screen: Low Risk  (07/30/2017)  Depression (PHQ2-9): Low Risk  (07/10/2020)  Tobacco Use: Medium Risk (03/23/2022)     Readmission Risk Interventions    04/02/2022    3:21 PM 03/20/2022   12:20 PM 12/10/2021    8:41 AM  Readmission Risk Prevention Plan  Transportation Screening Complete Complete Complete  Medication Review [employer] photographer) Complete Complete Complete  PCP or Specialist appointment within 3-5 days of discharge Complete Complete Complete  HRI or Home Care Consult  Complete Complete Complete  SW Recovery Care/Counseling Consult Complete Complete Complete  Palliative Care Screening Not Applicable Not Applicable Not Applicable  Skilled Nursing Facility Complete Complete Complete

## 2022-04-03 IMAGING — CR DG KNEE COMPLETE 4+V*R*
4 series · 4 of 4 positions shown · non-contrast
Comparison: None.

CLINICAL DATA: Fall, right knee swelling

EXAM:
RIGHT KNEE - COMPLETE 4+ VIEW

[knee ap]
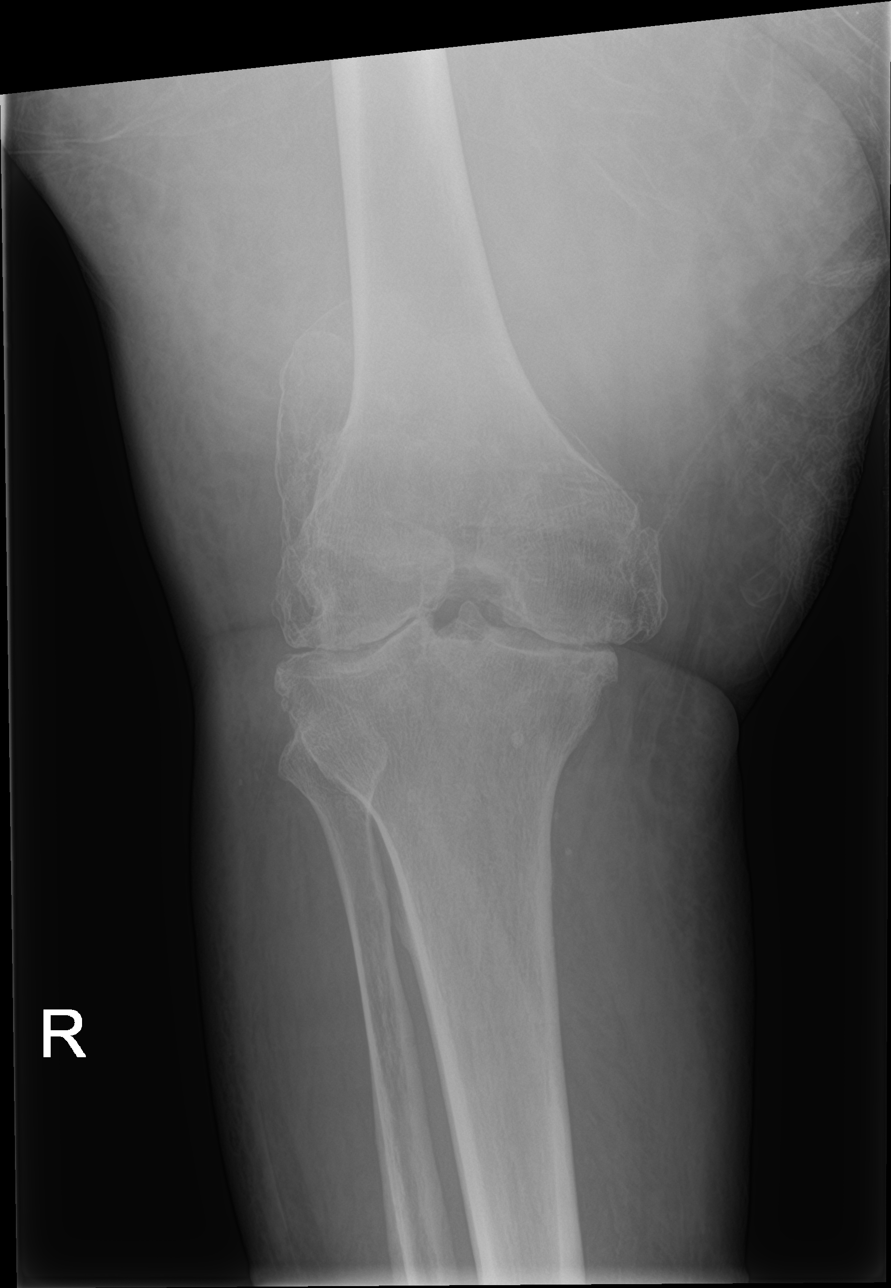

[knee lat]
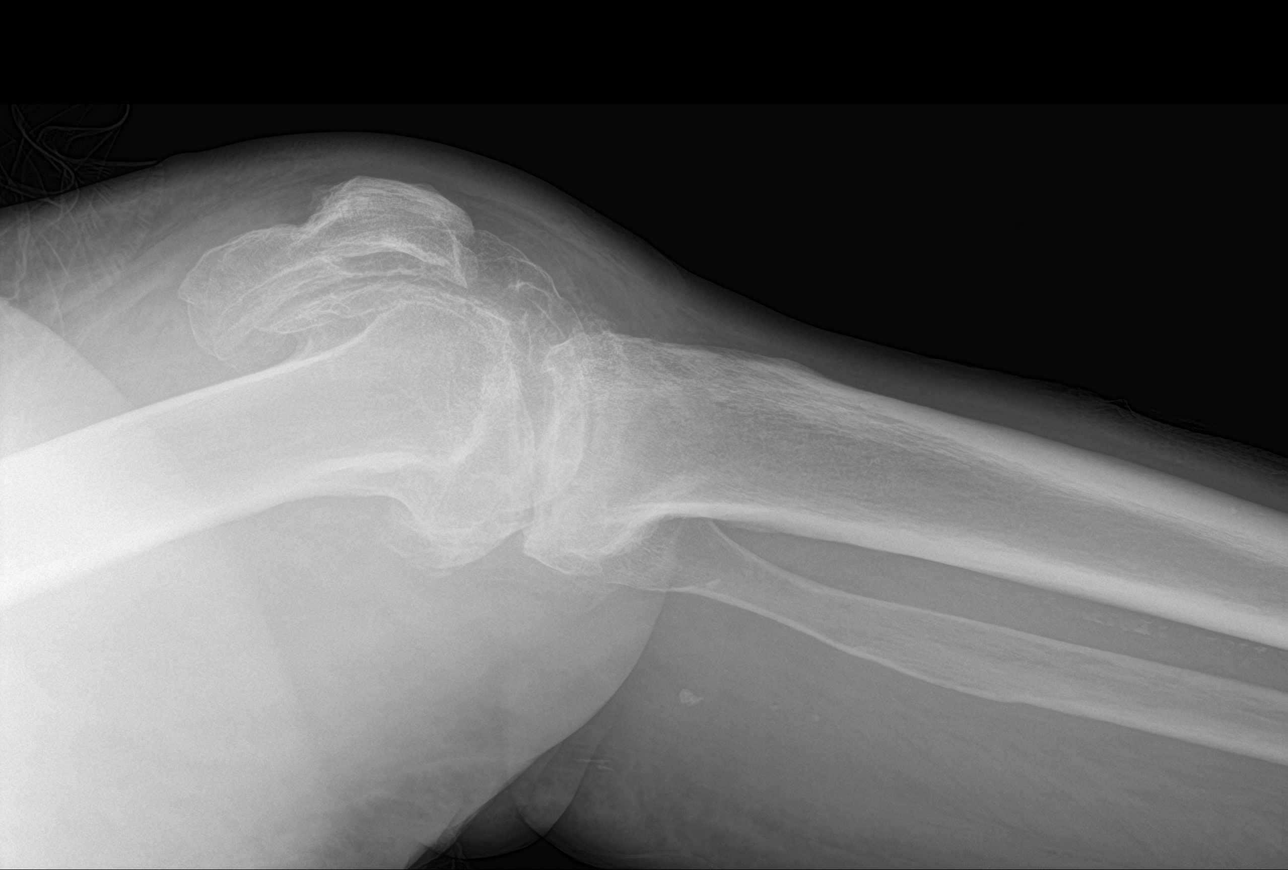

[knee obl (1 of 2)]
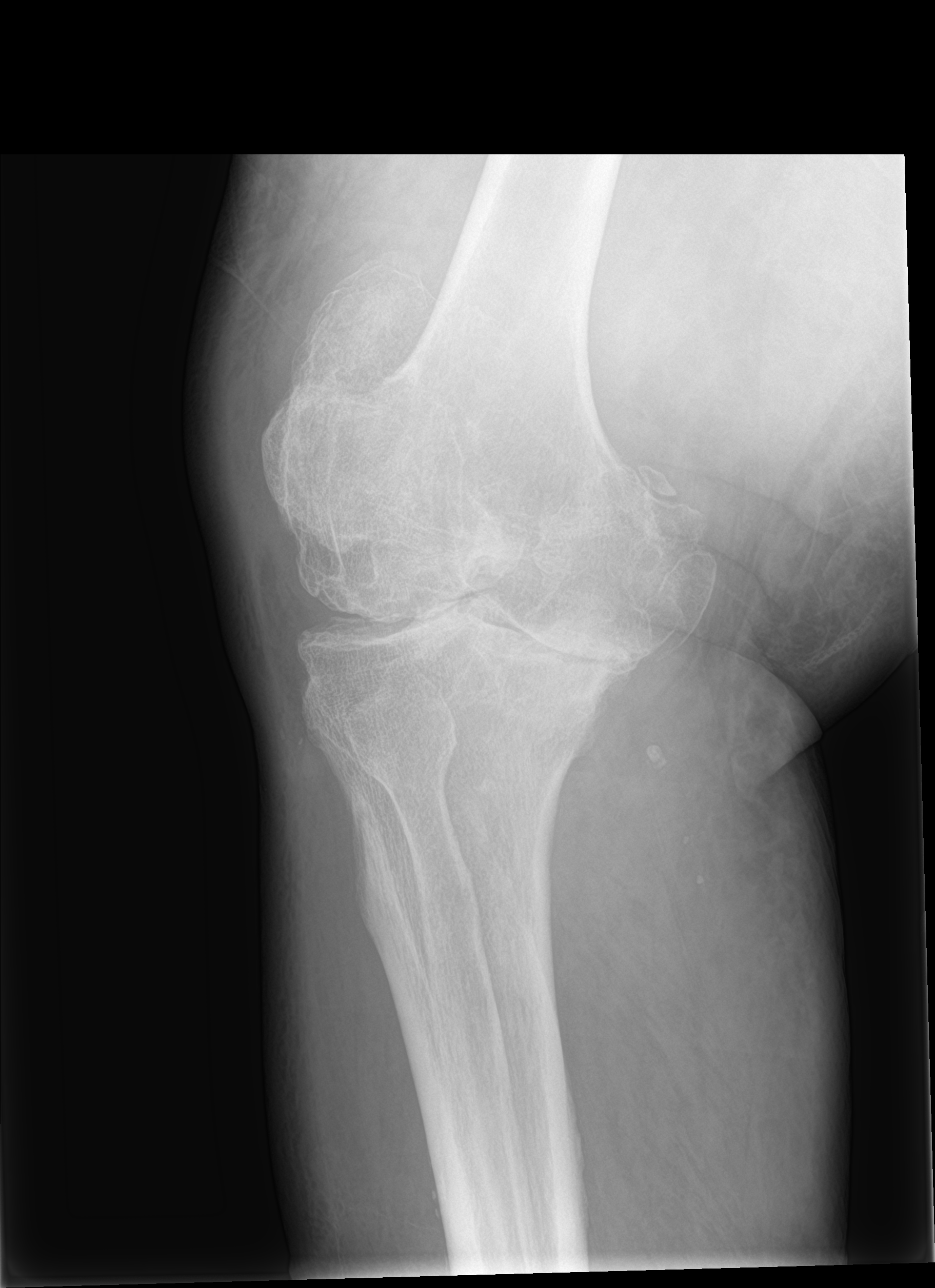

[knee obl (2 of 2)]
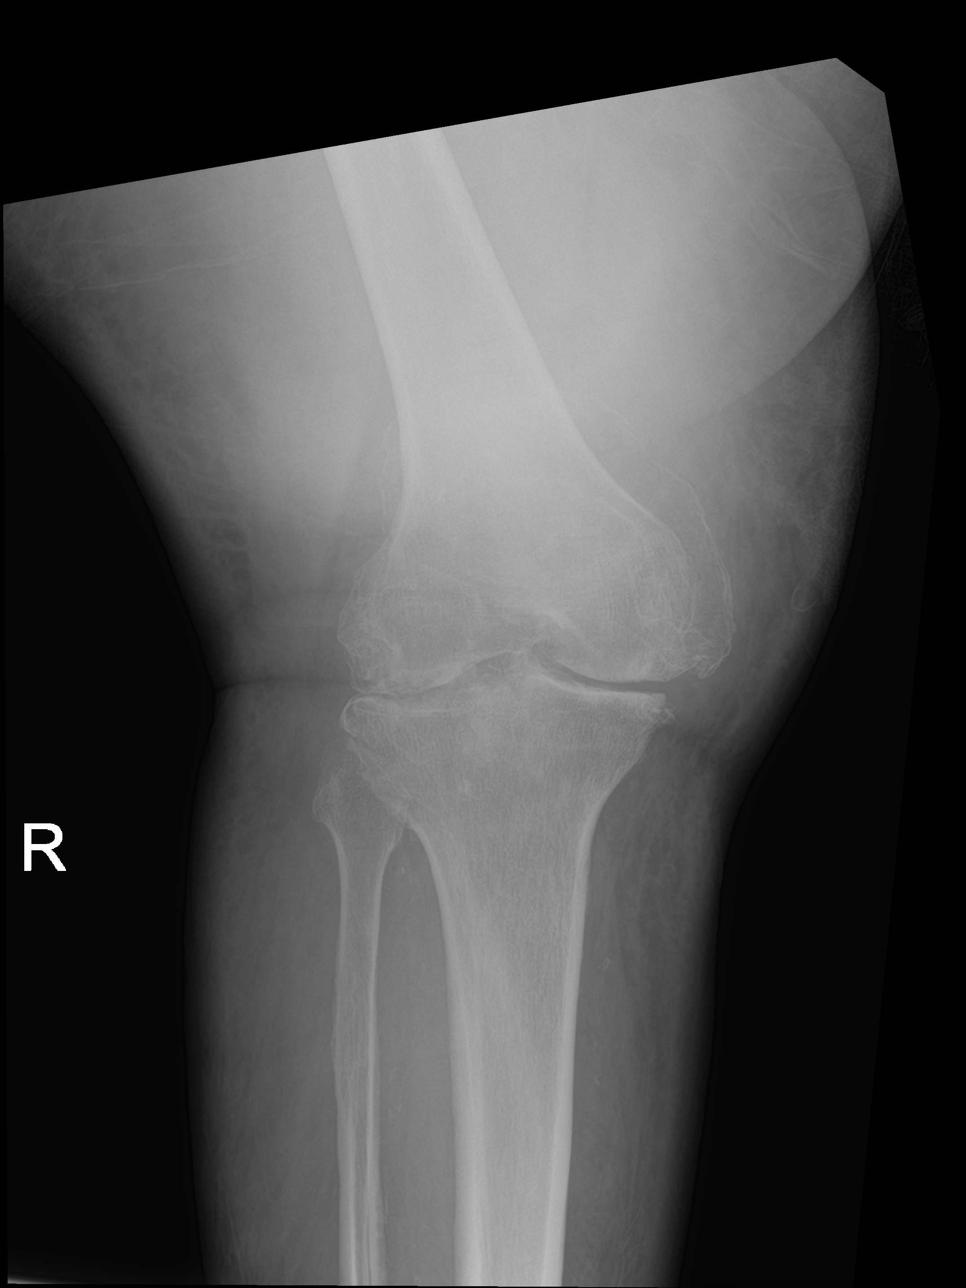

[4 of 4 positions shown; findings below may reference images not displayed]

FINDINGS: No fracture or dislocation is seen.

Severe tricompartmental degenerative changes.

Old fracture deformity of the patella.

Mild prepatellar soft tissue swelling.
IMPRESSION: Mild prepatellar soft tissue swelling.

No fracture or dislocation is seen.

Severe tricompartmental degenerative changes.

## 2022-04-15 IMAGING — DX DG CHEST 1V PORT
1 series · 1 of 1 positions shown · non-contrast
Comparison: 07/26/2020

CLINICAL DATA: Chest pain

EXAM:
PORTABLE CHEST 1 VIEW

[chest ap]
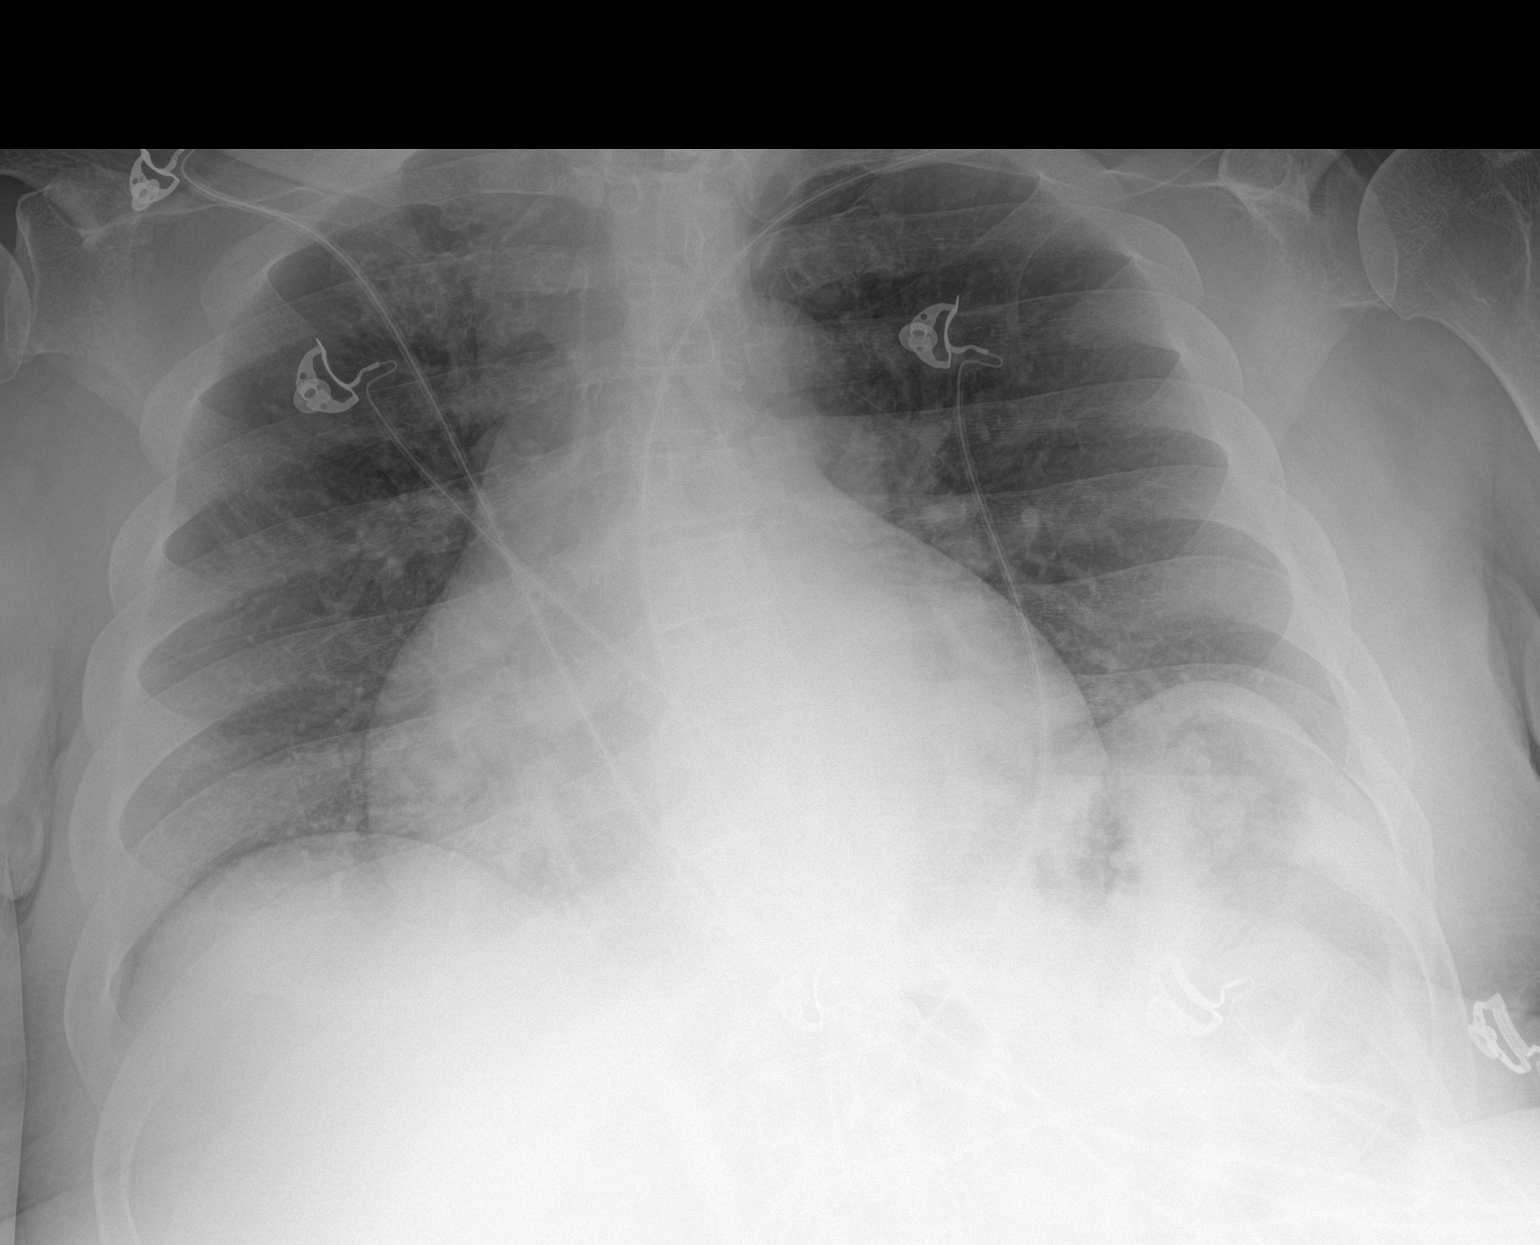

[1 of 1 positions shown; findings below may reference images not displayed]

FINDINGS: Cardiomegaly. No confluent opacities, effusions or edema. No acute
bony abnormality.
IMPRESSION: Cardiomegaly.  No active disease.

## 2022-04-26 IMAGING — DX DG CHEST 1V PORT
1 series · 1 of 1 positions shown · non-contrast
Comparison: 08/05/2020

CLINICAL DATA: Shortness of breath

EXAM:
PORTABLE CHEST 1 VIEW

[chest]
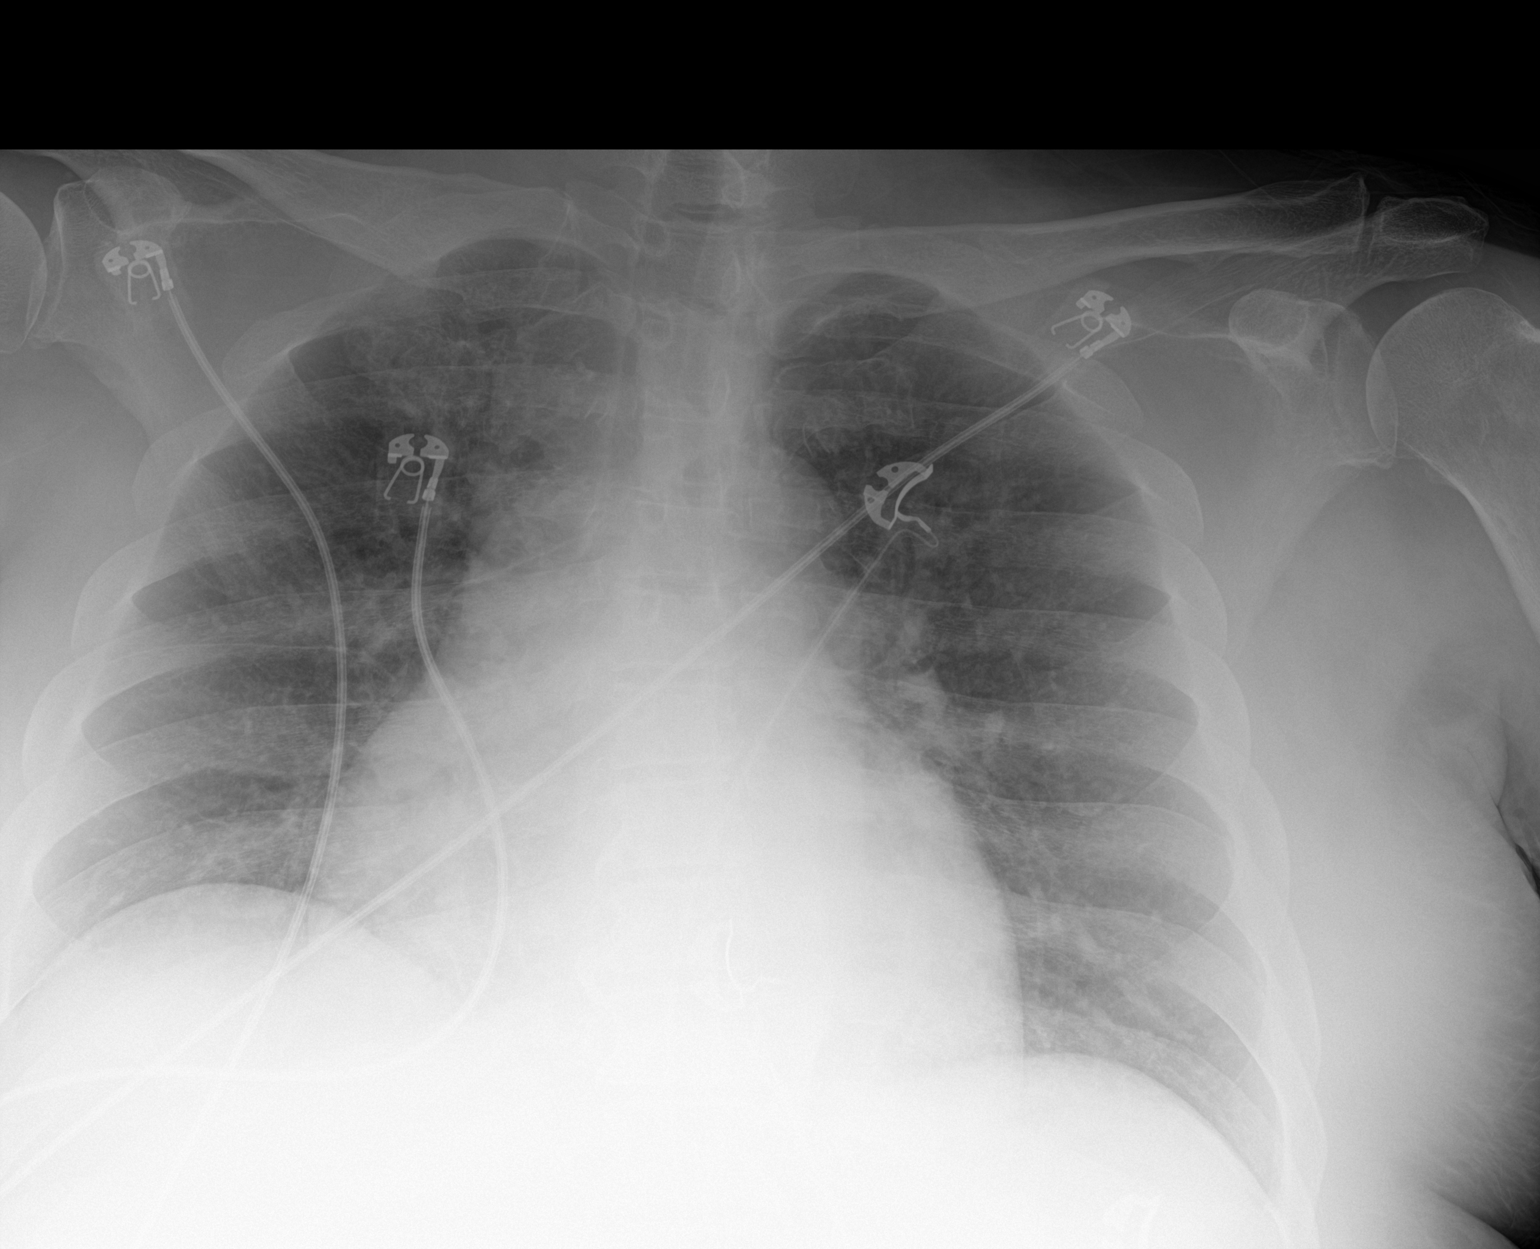

[1 of 1 positions shown; findings below may reference images not displayed]

FINDINGS: Stable cardiomegaly. Mild streaky bibasilar opacities, right
slightly greater than left. No overt edema. No significant pleural
fluid collection evident on semi upright view. No pneumothorax.
IMPRESSION: Cardiomegaly with mild streaky bibasilar opacities, right slightly
greater than left, may reflect atelectasis versus infiltrate.

## 2022-04-28 IMAGING — CR DG CHEST 1V
2 series · 2 of 2 positions shown · non-contrast
Comparison: 08/16/2020

CLINICAL DATA: Dyspnea

EXAM:
CHEST  1 VIEW

[x chest ap (1 of 2)]
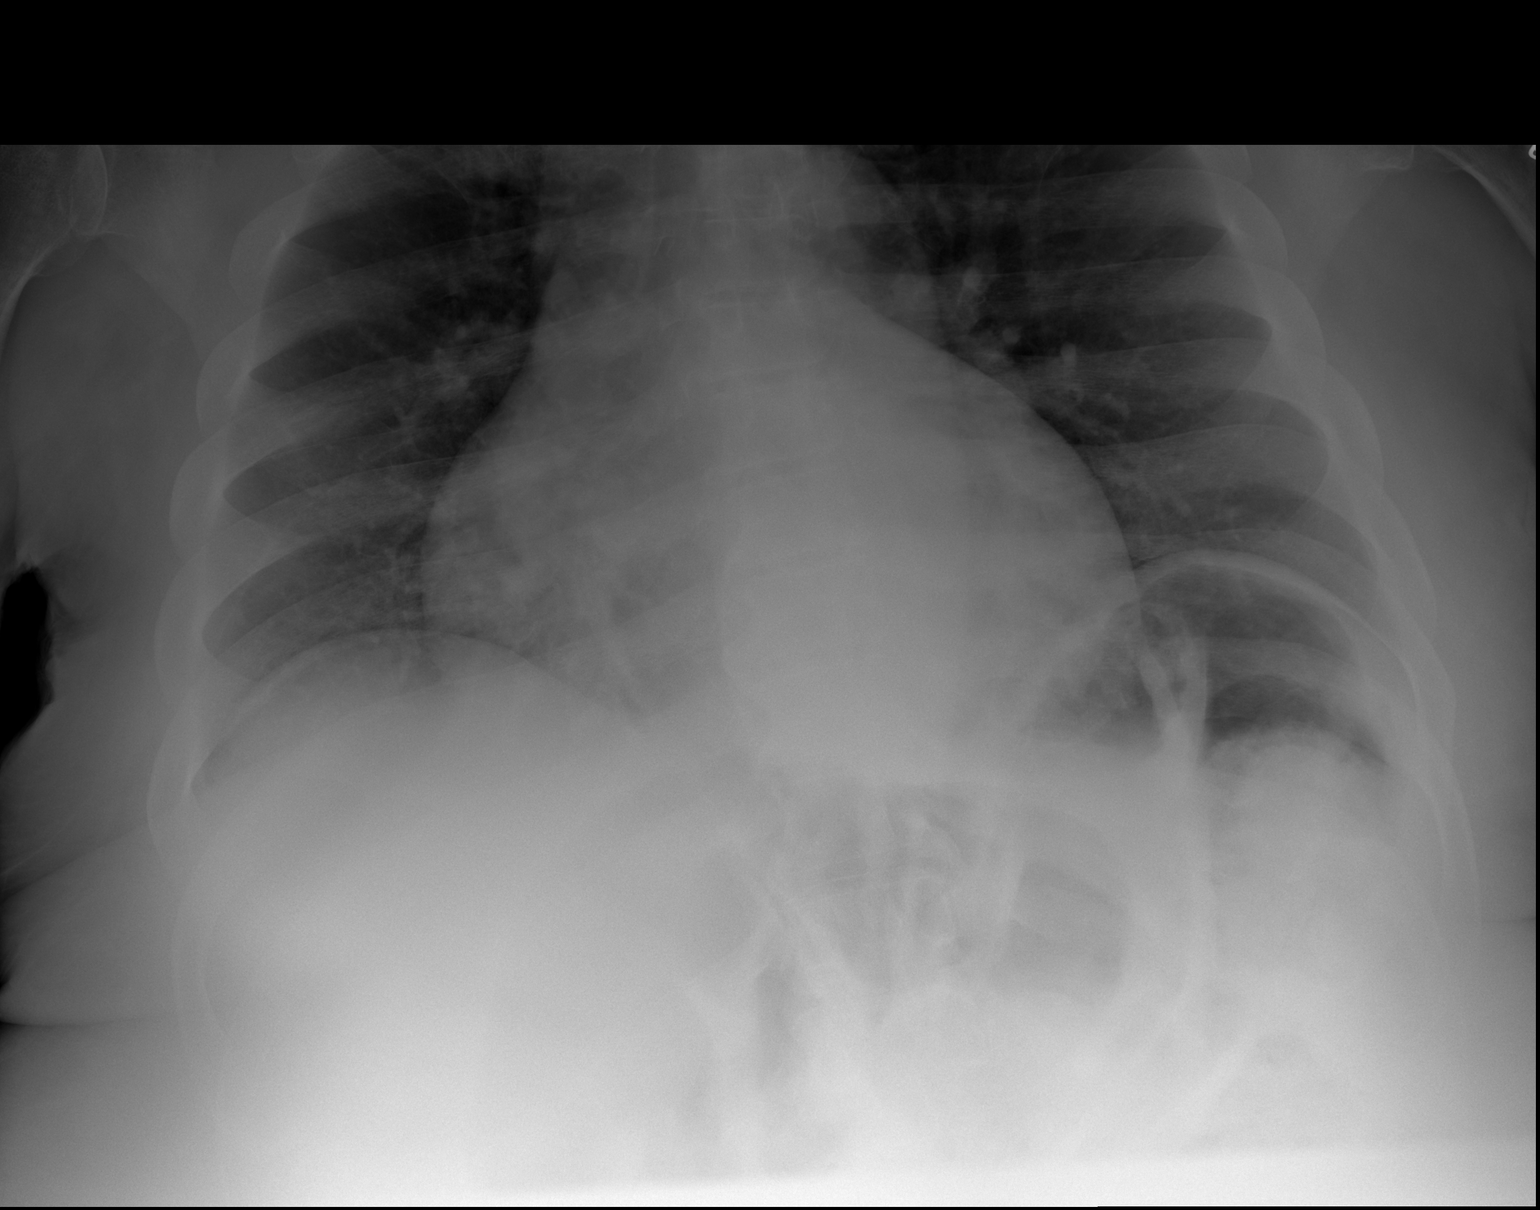

[x chest ap (2 of 2)]
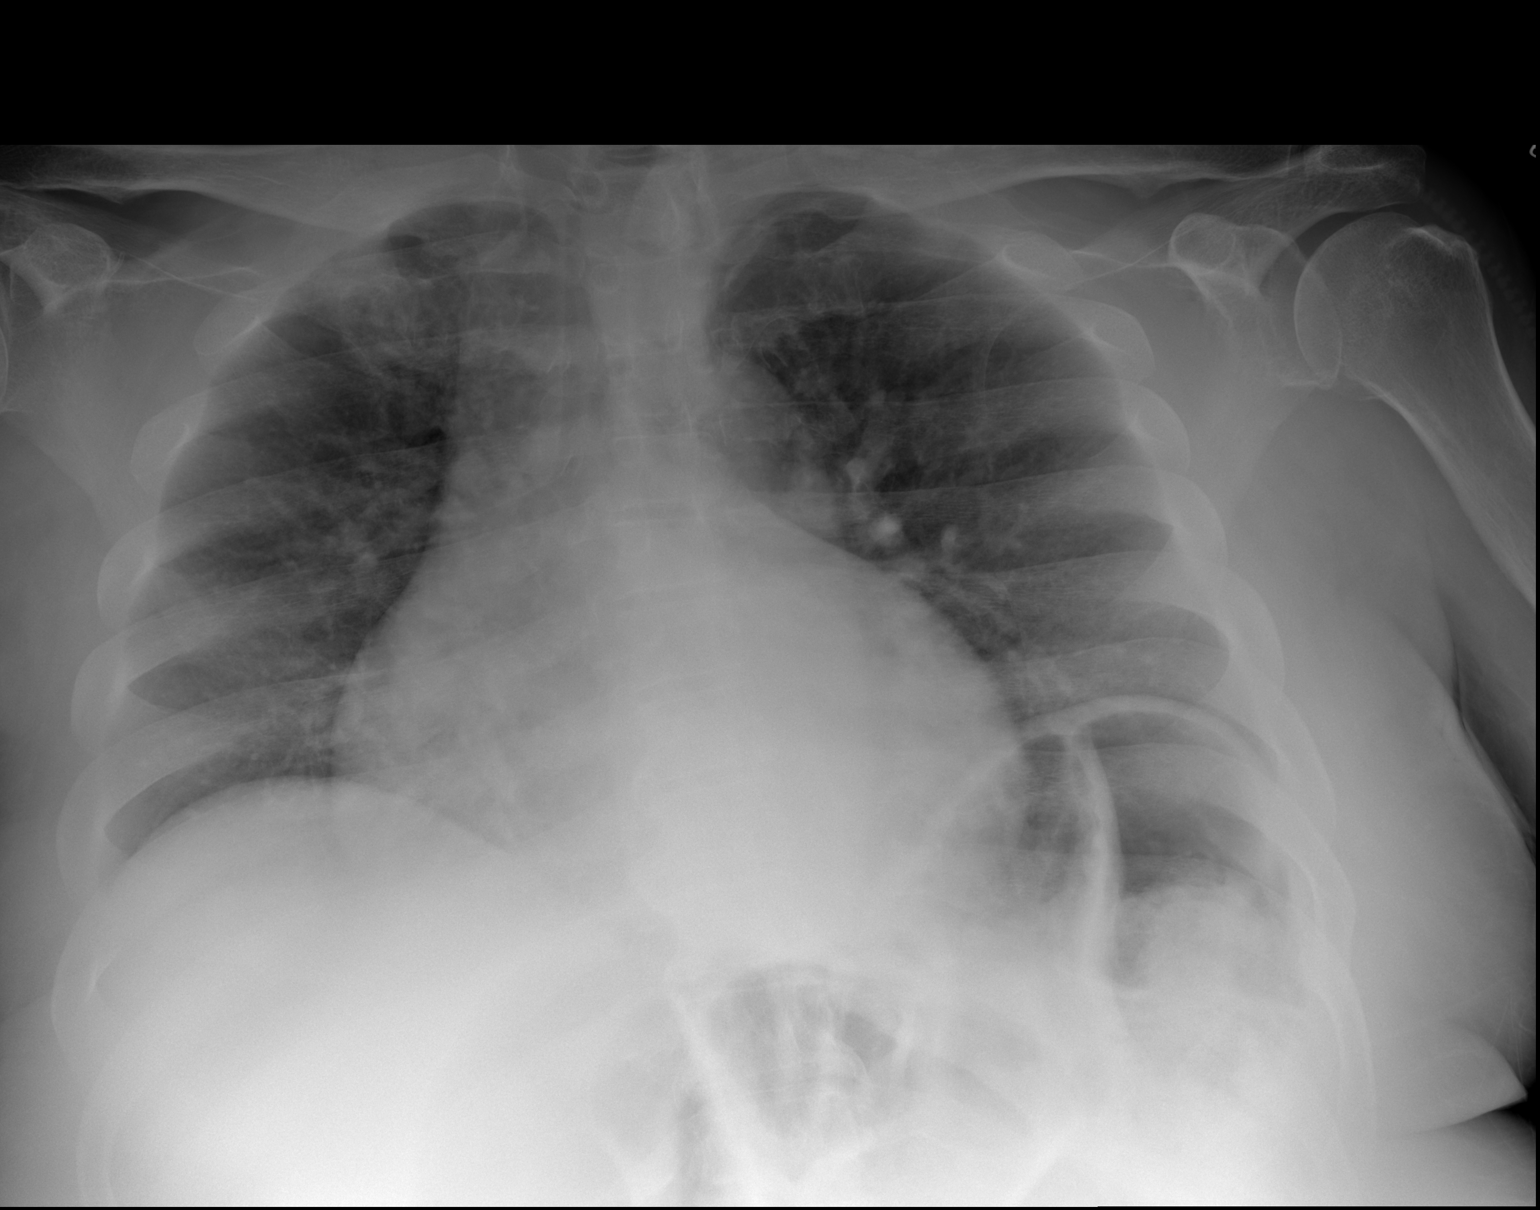

[2 of 2 positions shown; findings below may reference images not displayed]

FINDINGS: Lungs volumes are small, but are symmetric and are clear. No
pneumothorax or pleural effusion. Stable mild cardiomegaly.
Pulmonary vascularity is normal. Osseous structures are
age-appropriate. No acute bone abnormality.
IMPRESSION: Pulmonary hypoinflation.  Stable cardiomegaly.

## 2022-04-28 IMAGING — CT CT ABD-PELV W/ CM
2 of 5 series · 16 of 46 positions shown, 18 images · IV contrast (omnipaque)
Comparison: 07/11/2020

CLINICAL DATA: Abdomen and groin pain and groin swelling.

EXAM:
CT ABDOMEN AND PELVIS WITH CONTRAST
TECHNIQUE: Multidetector CT imaging of the abdomen and pelvis was performed
using the standard protocol following bolus administration of
intravenous contrast.
CONTRAST:  100mL OMNIPAQUE IOHEXOL 300 MG/ML  SOLN

[Series 2: axial st · axial · 0.96mm/px · z∈[+929,+1424]mm · 13 of 117 slices shown, 15 images]
[im 9/117  soft-tissue]
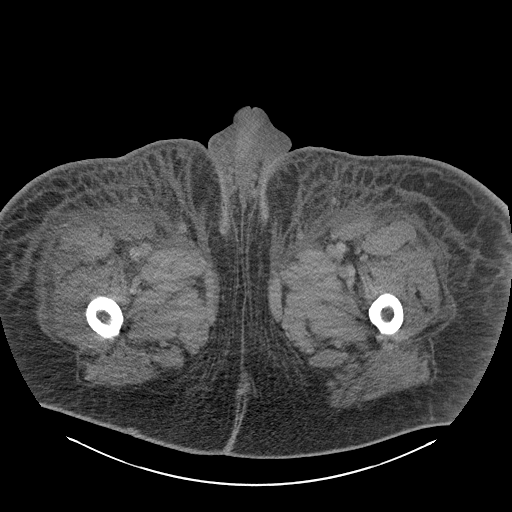
[im 9/117  bone]
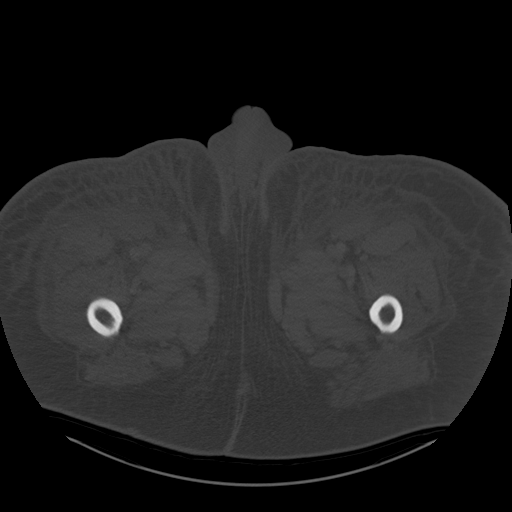
[im 17/117  soft-tissue]
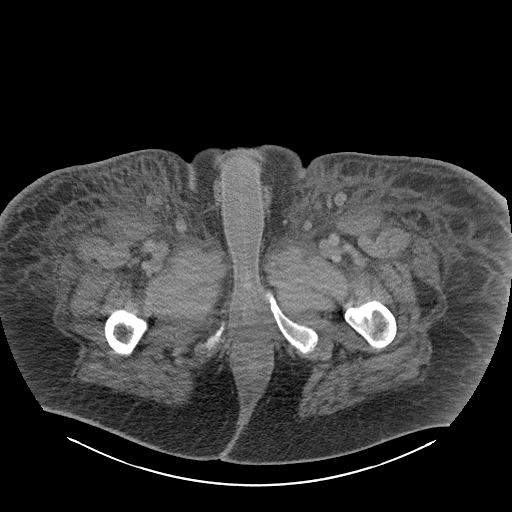
[im 25/117  soft-tissue]
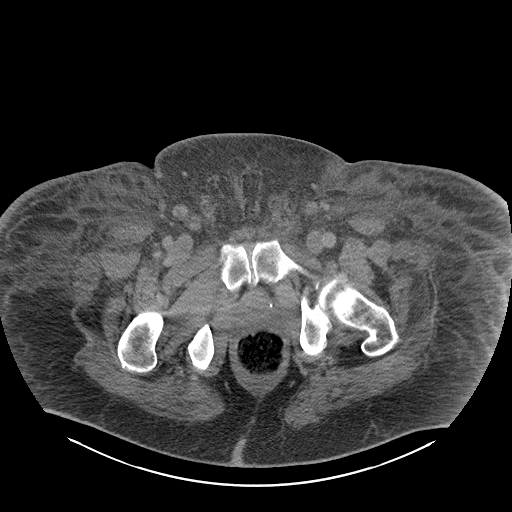
[im 34/117  soft-tissue]
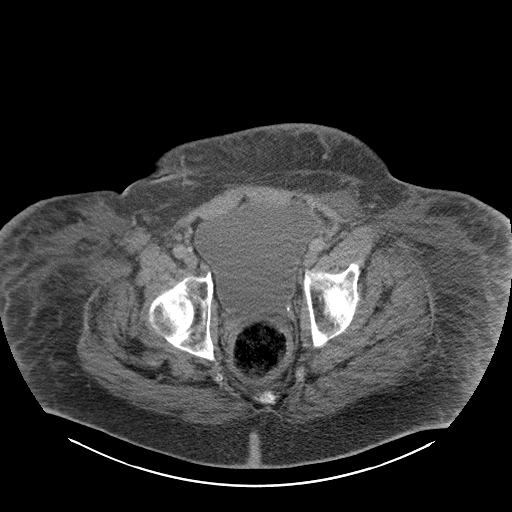
[im 42/117  soft-tissue]
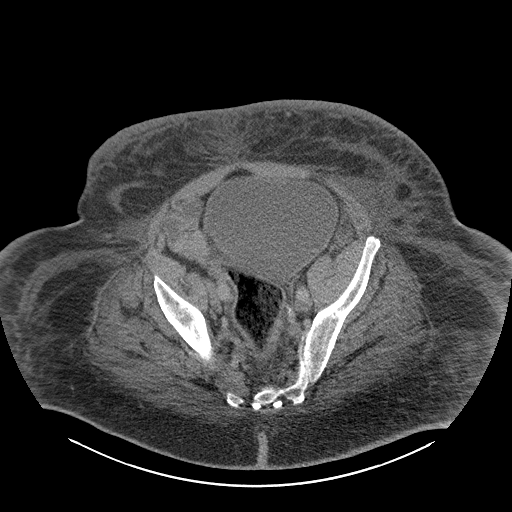
[im 50/117  soft-tissue]
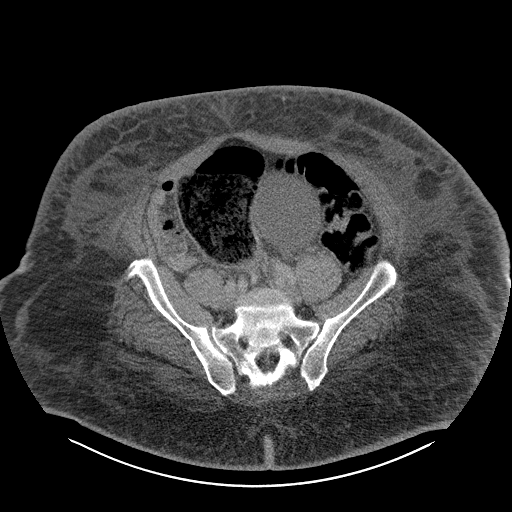
[im 59/117  soft-tissue]
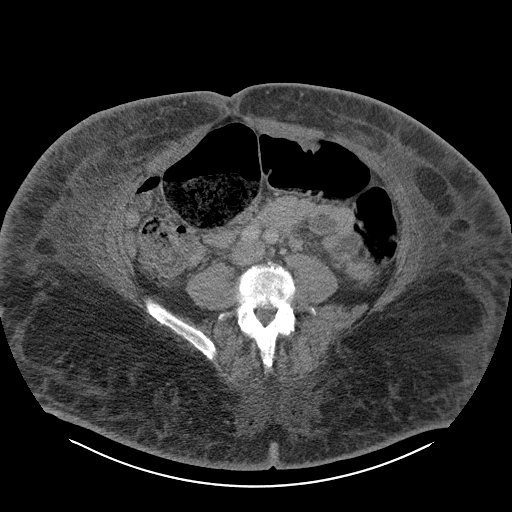
[im 67/117  soft-tissue]
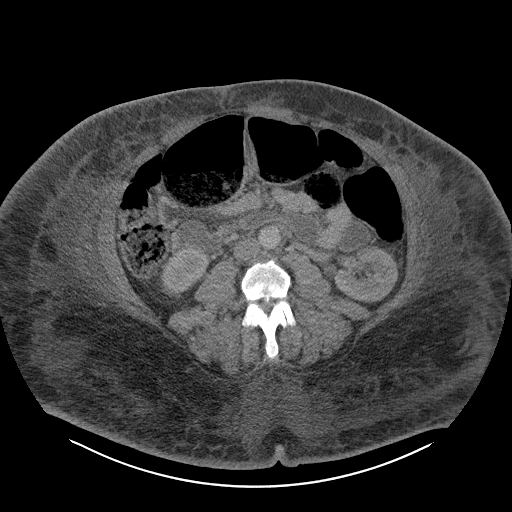
[im 75/117  soft-tissue]
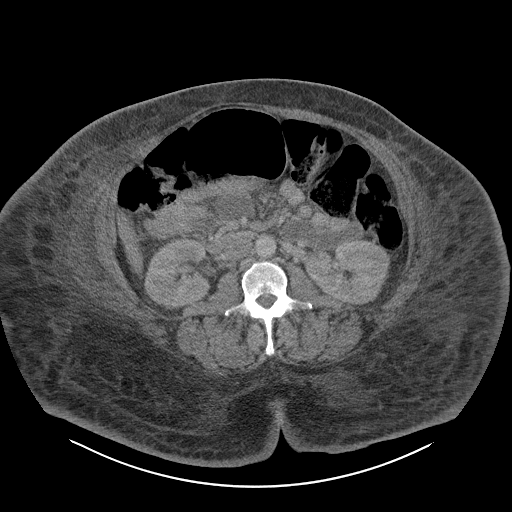
[im 75/117  bone]
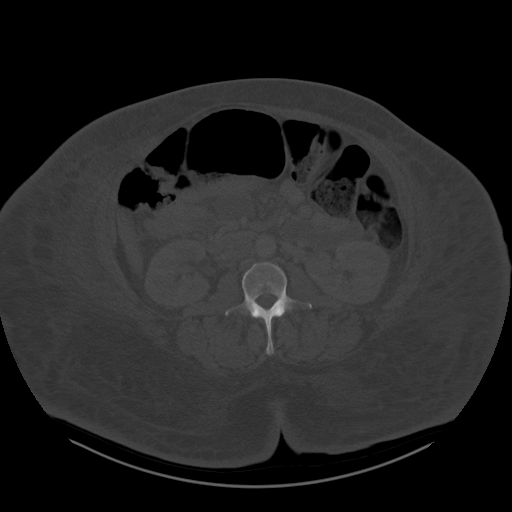
[im 83/117  soft-tissue]
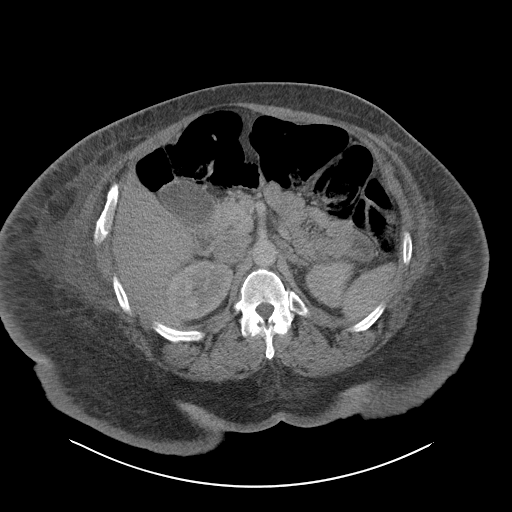
[im 92/117  soft-tissue]
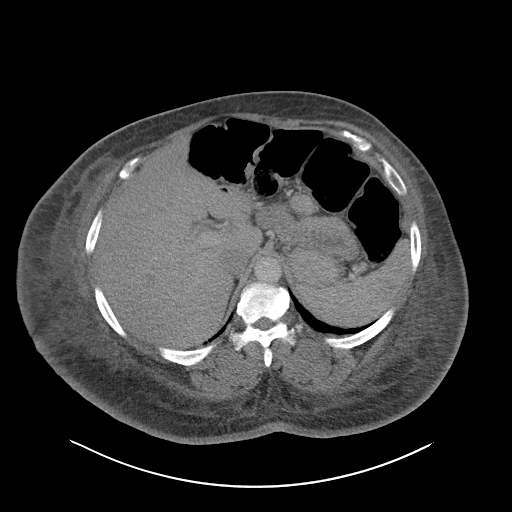
[im 100/117  soft-tissue]
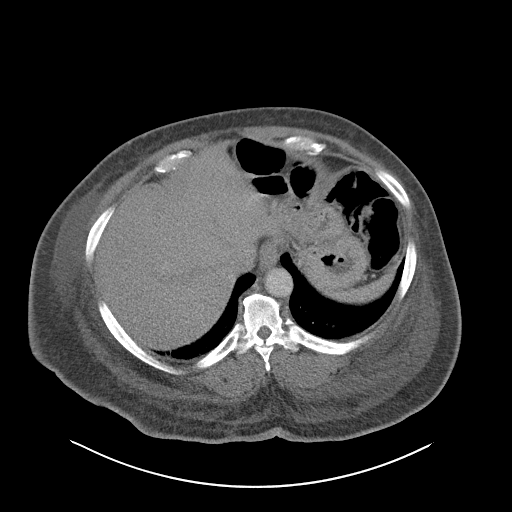
[im 108/117  soft-tissue]
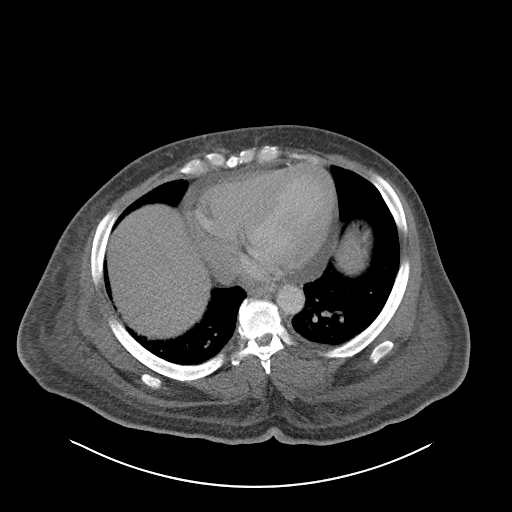

[Series 5: coronal st · coronal · 1.11mm/px · 3 of 161 slices shown]
[im 54/161  soft-tissue]
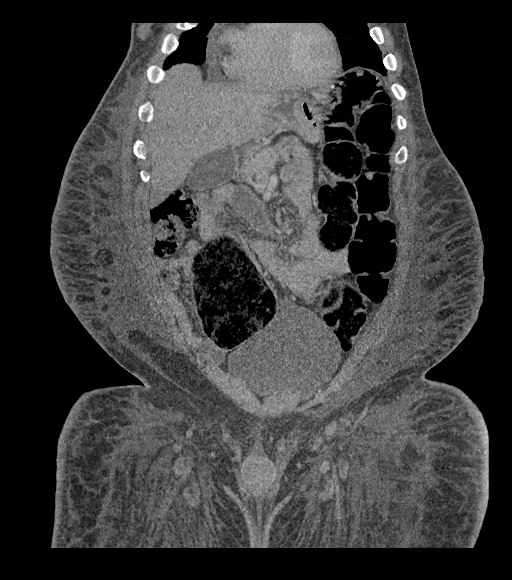
[im 72/161  soft-tissue]
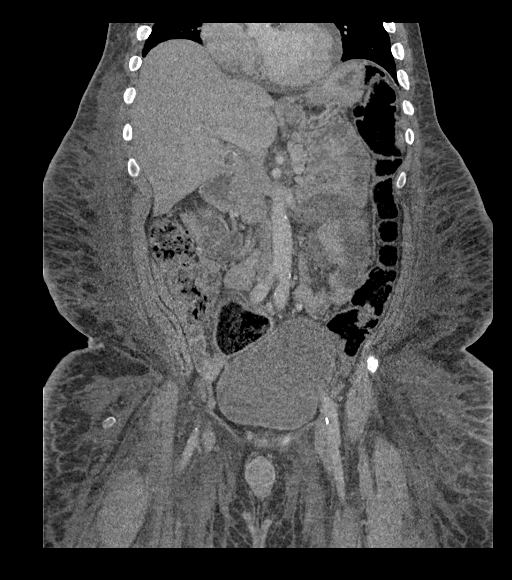
[im 89/161  soft-tissue]
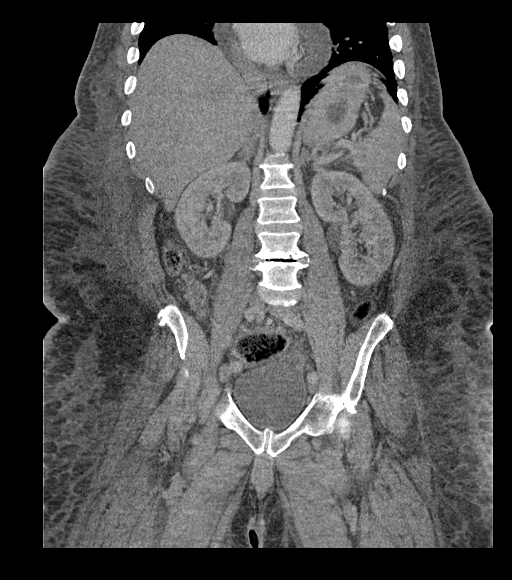

[16 of 46 positions shown; findings below may reference images not displayed]

FINDINGS: Lower chest: Interval increase in size of a pericardial effusion,
currently measuring 2.2 cm in maximum thickness. The heart is mildly
enlarged without significant change. Prominent pulmonary vasculature
is again demonstrated at the lung bases. No pleural fluid.

Hepatobiliary: 2.7 cm gallstone in the gallbladder without
gallbladder wall thickening or pericholecystic fluid. Unremarkable
liver.

Pancreas: Unremarkable. No pancreatic ductal dilatation or
surrounding inflammatory changes.

Spleen: Normal in size without focal abnormality.

Adrenals/Urinary Tract: Adrenal glands are unremarkable. Kidneys are
normal, without renal calculi, focal lesion, or hydronephrosis.
Bladder is unremarkable.

Stomach/Bowel: Dilated rectum and sigmoid colon with an interval
increase in caliber as well as prominent stool in the rectum and
distal sigmoid colon. The remainder of the colon remains borderline
dilated. Unremarkable stomach, small bowel and appendix.

Vascular/Lymphatic: No significant vascular findings are present. No
enlarged abdominal or pelvic lymph nodes.

Reproductive: Prostate is unremarkable. Partially included bilateral
hydroceles.

Other: Extensive diffuse subcutaneous edema with mild progression.
No free peritoneal fluid or hernia seen.

Musculoskeletal: Lumbar and lower thoracic spine degenerative
changes.
IMPRESSION: 1. Extensive diffuse subcutaneous edema, mildly increased.
2. Partially included bilateral scrotal hydroceles.
3. Moderate-sized pericardial effusion, increased.
4. Increased stool in the rectum and distal sigmoid colon, causing
partial functional obstruction of the colon.
5. Cholelithiasis without evidence of cholecystitis.

## 2022-05-01 IMAGING — DX DG CHEST 1V PORT
1 series · 1 of 1 positions shown · non-contrast
Comparison: Chest x-ray dated August 17, 2020.

CLINICAL DATA: Central chest pressure.

EXAM:
PORTABLE CHEST 1 VIEW

[chest ap]
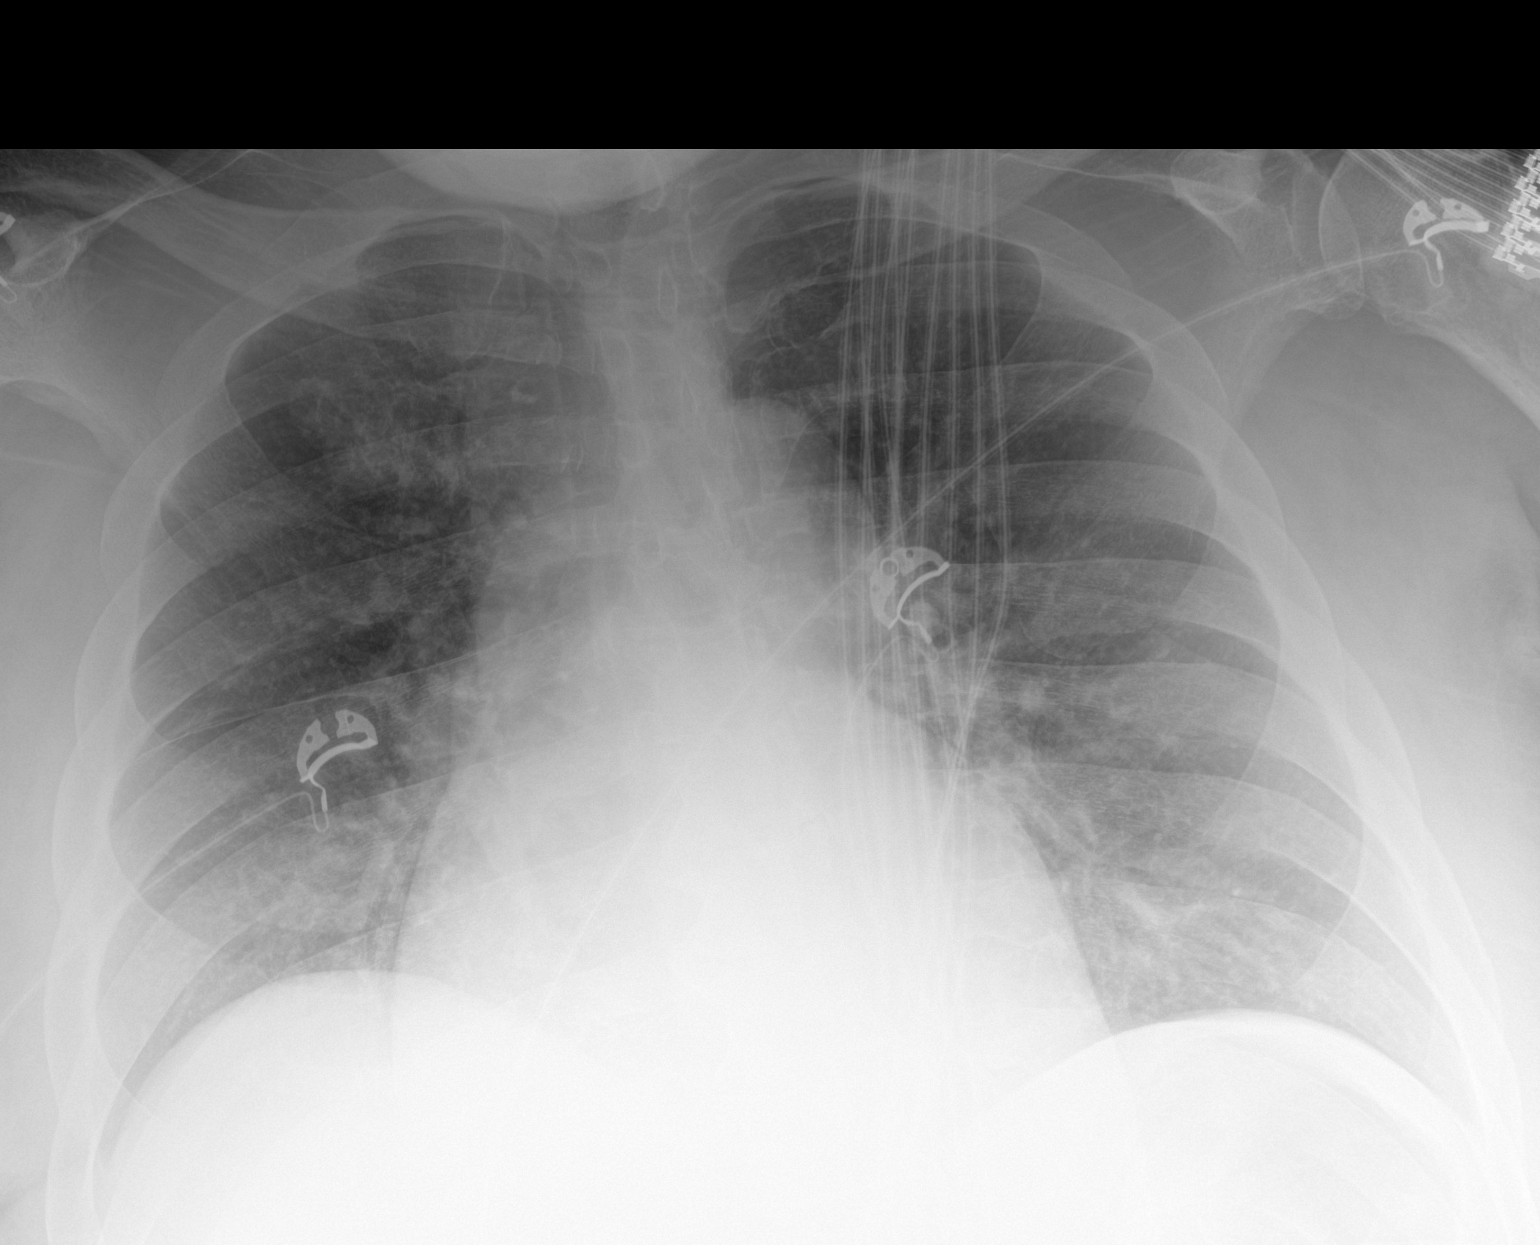

[1 of 1 positions shown; findings below may reference images not displayed]

FINDINGS: The heart size and mediastinal contours are within normal limits.
Both lungs are clear. The visualized skeletal structures are
unremarkable.
IMPRESSION: No active disease.

## 2022-05-02 ENCOUNTER — Other Ambulatory Visit: Payer: Self-pay

## 2022-05-02 DIAGNOSIS — E041 Nontoxic single thyroid nodule: Secondary | ICD-10-CM

## 2022-05-09 IMAGING — DX DG ABDOMEN 1V
2 series · 2 of 2 positions shown · non-contrast
Comparison: June 26, 2020.

CLINICAL DATA: Abdominal pain.

EXAM:
ABDOMEN - 1 VIEW

[abdomen kub (1 of 2)]
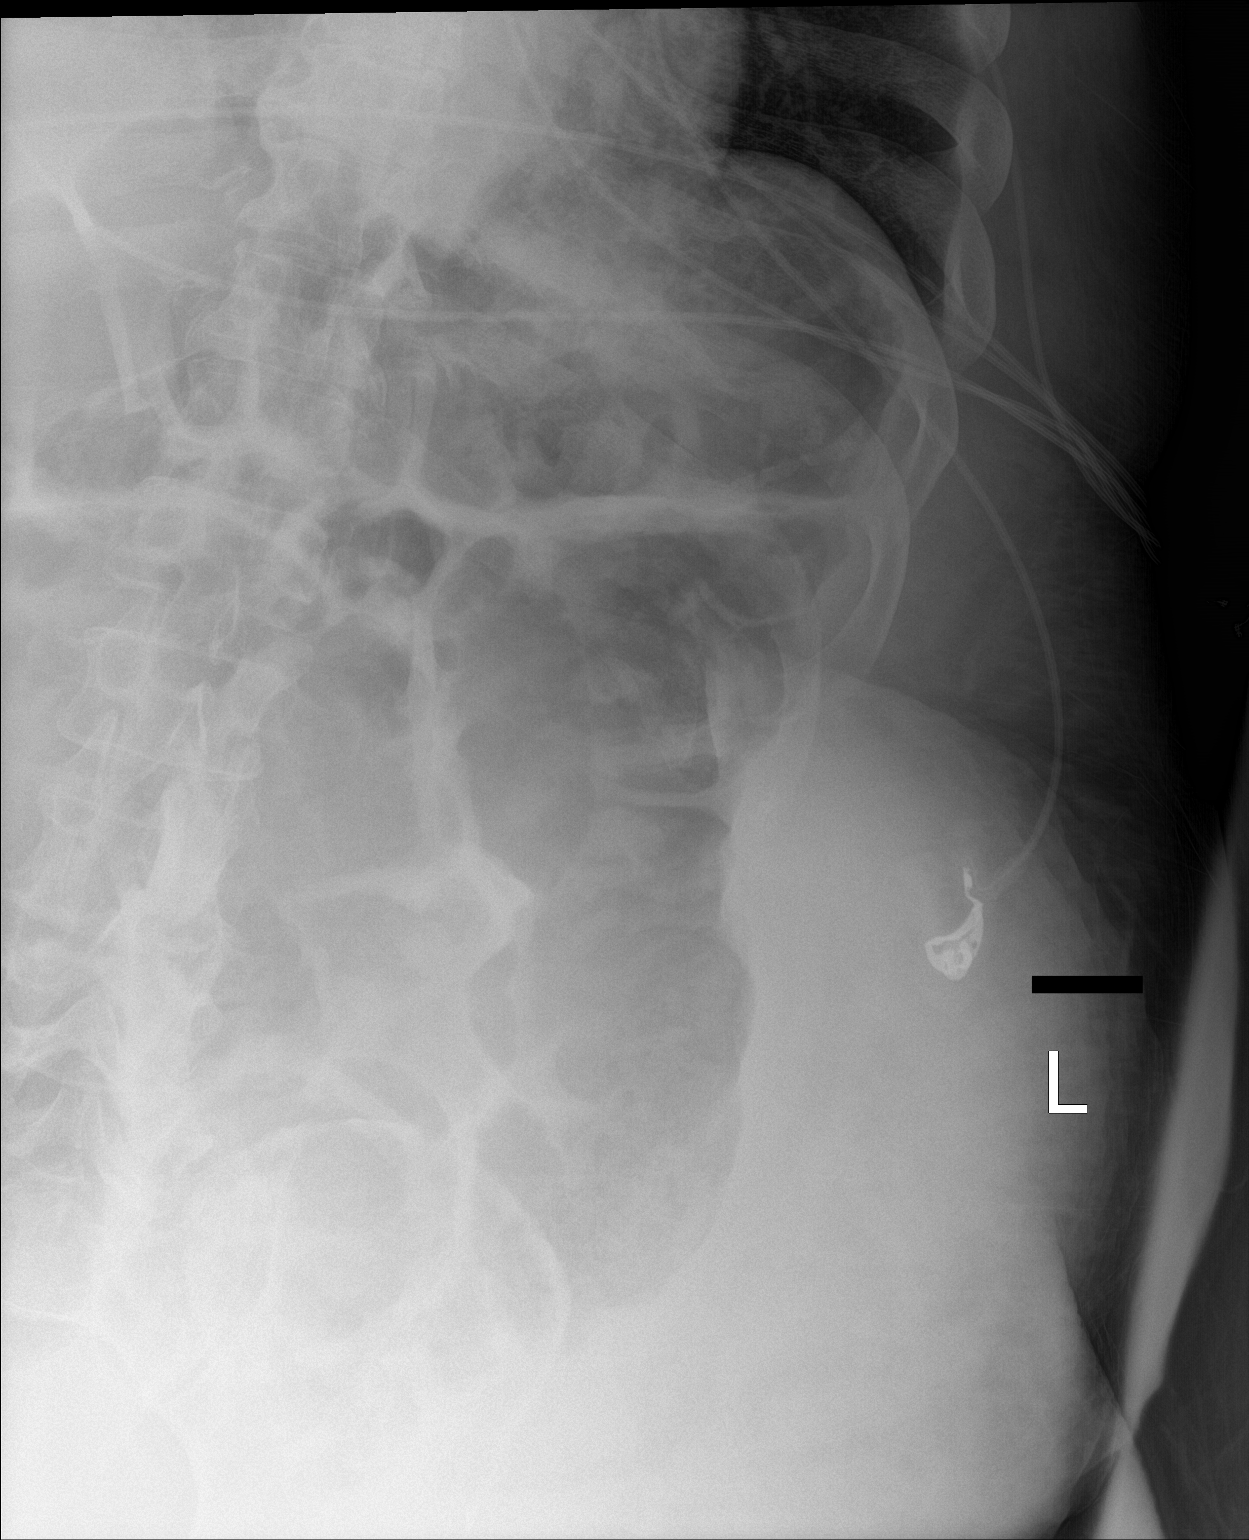

[abdomen kub (2 of 2)]
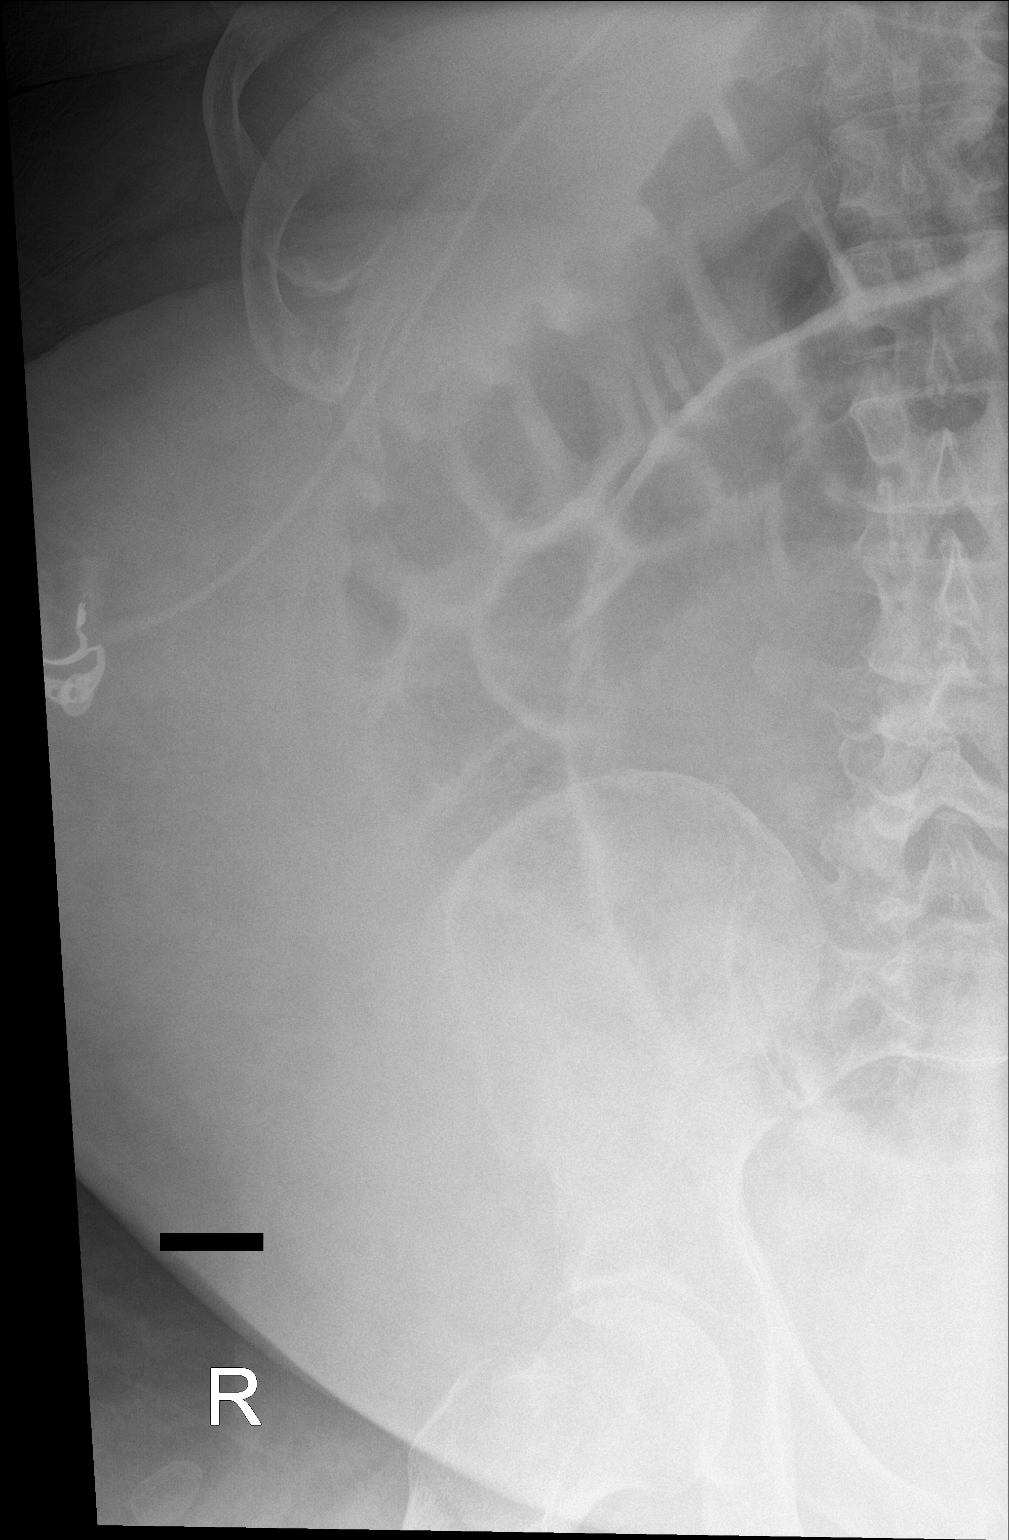

[2 of 2 positions shown; findings below may reference images not displayed]

FINDINGS: Mild gaseous distension of large and small bowel is noted suggesting
ileus or less likely obstruction. No calcifications are noted
IMPRESSION: Mild gaseous distention of large and small bowel is noted suggesting
ileus or less likely obstruction. Follow-up radiographs recommended.

## 2022-05-10 IMAGING — DX DG ABDOMEN 1V
1 series · 2 of 2 positions shown · non-contrast
Comparison: 08/29/2020.  CT 08/18/2020.

CLINICAL DATA: Abdominal distention.  Pain.

EXAM:
ABDOMEN - 1 VIEW

[Series 1: abdomen kub · 0.14mm/px · 2 of 2 slices shown]
[im 1/2]
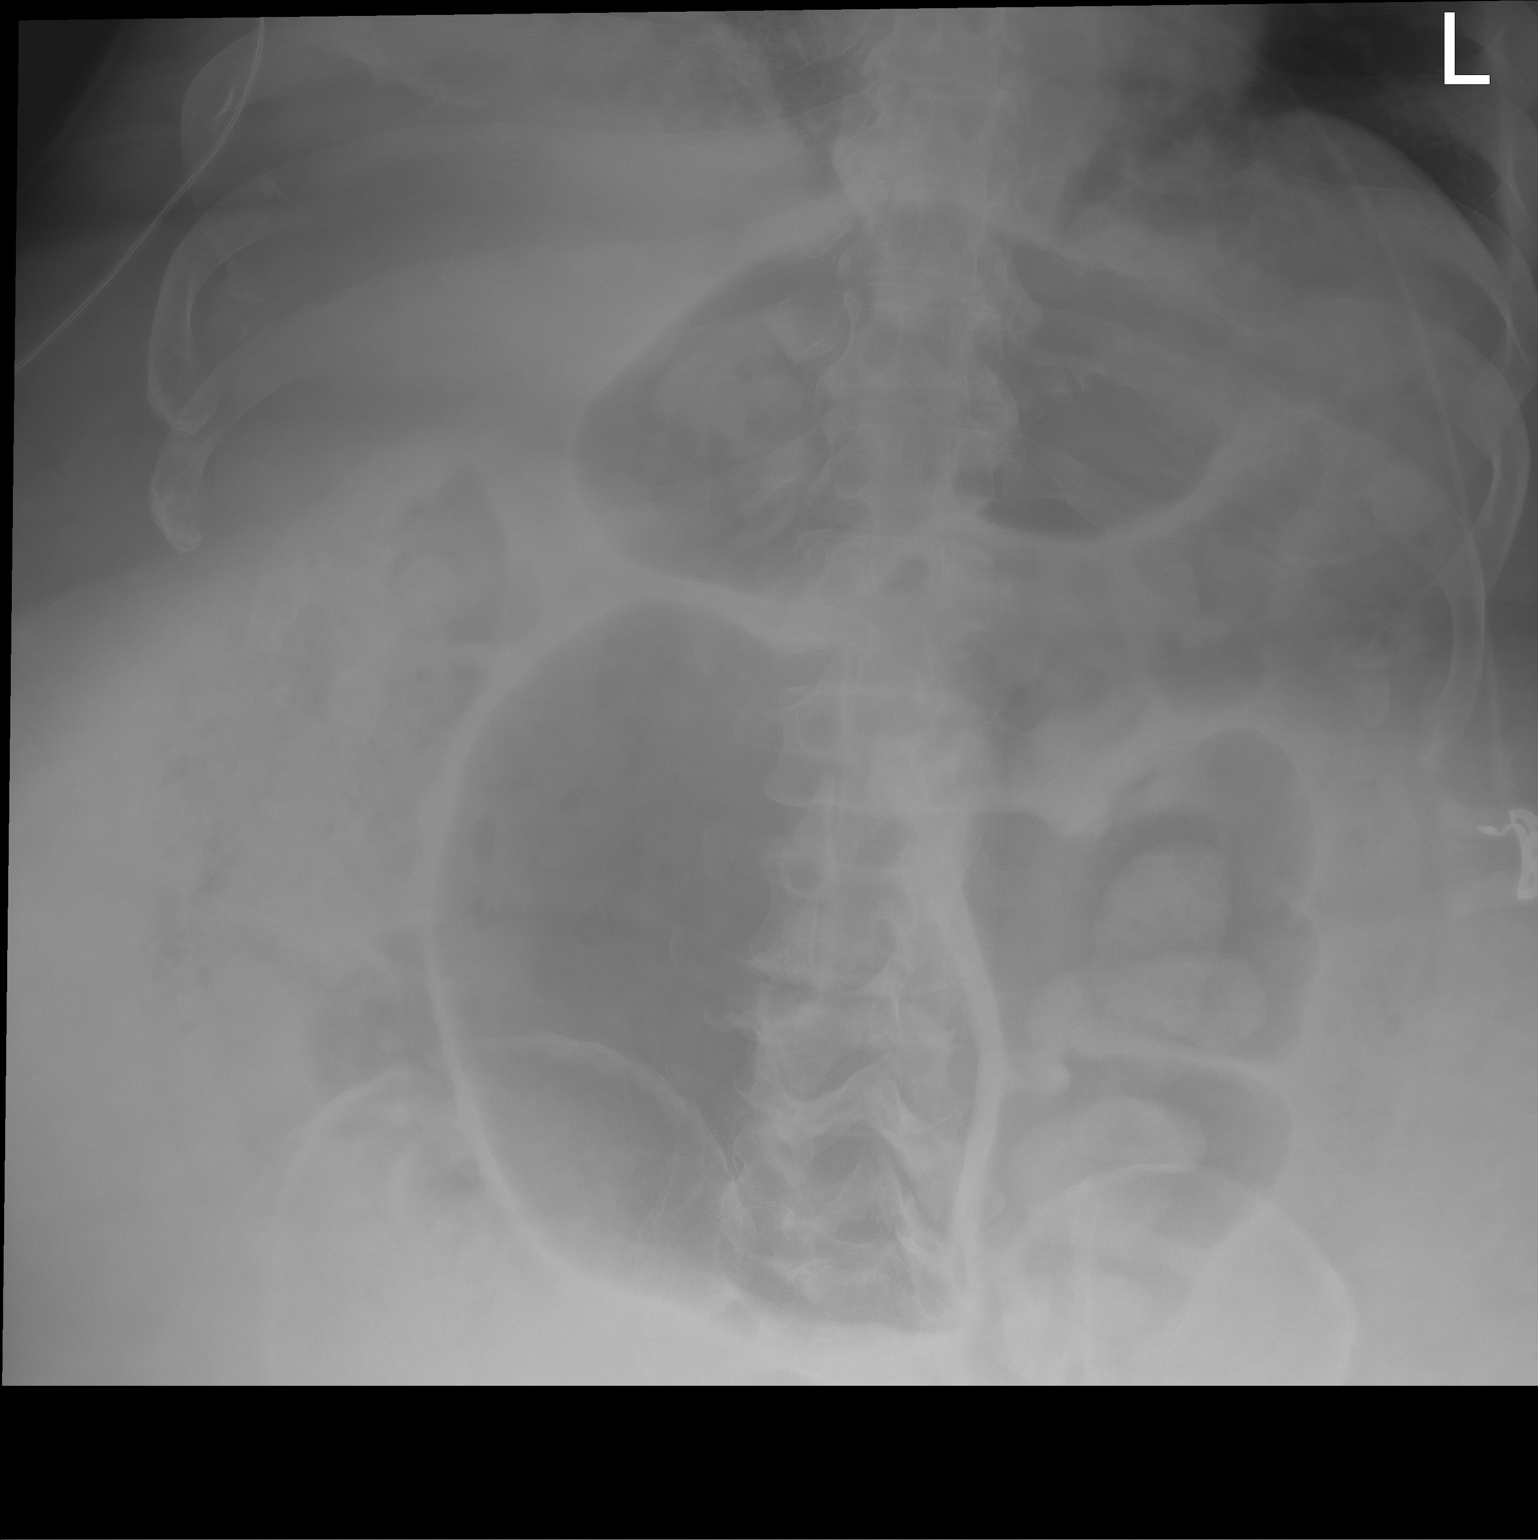
[im 2/2]
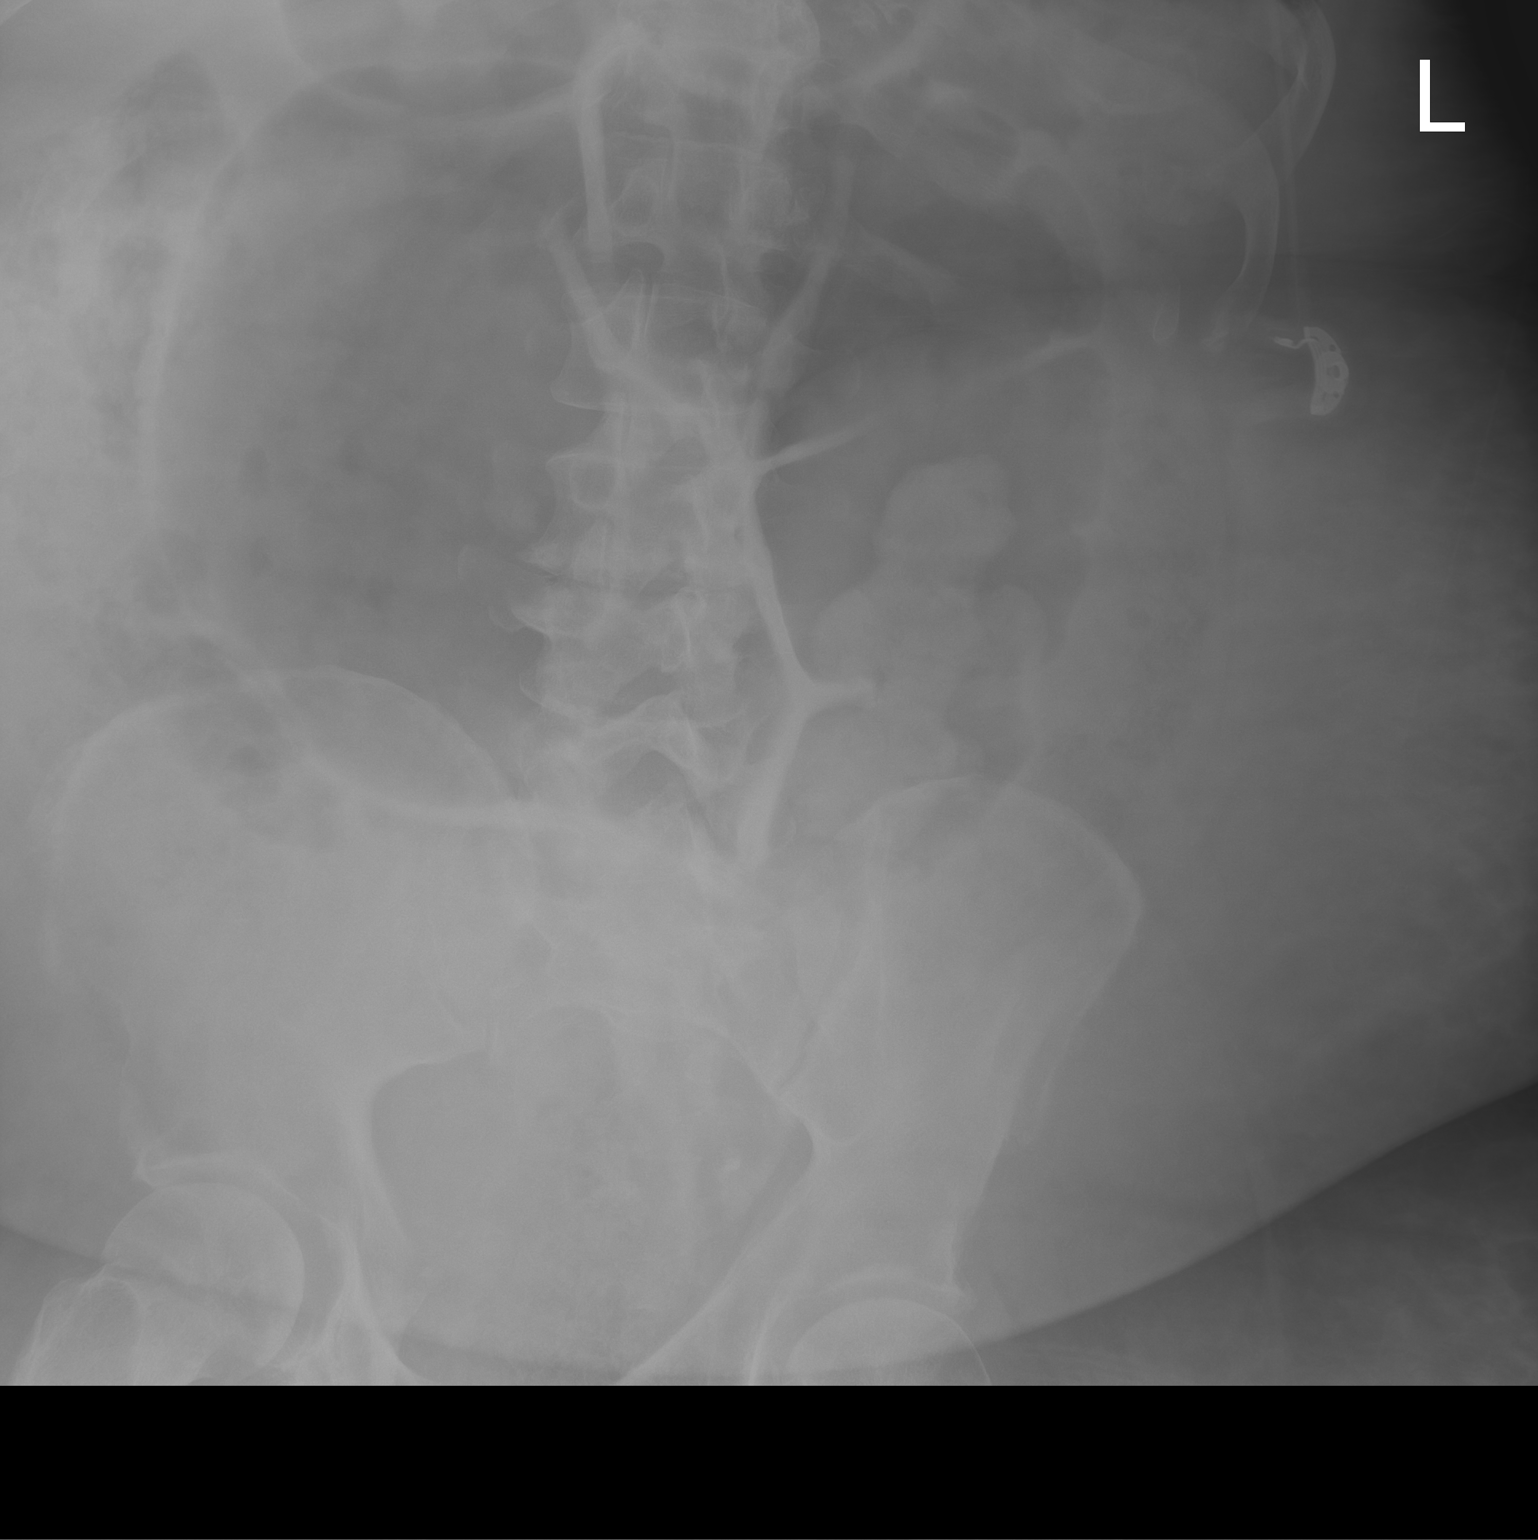

[2 of 2 positions shown; findings below may reference images not displayed]

FINDINGS: Colonic distention again noted. Similar finding noted on prior
studies including CT of 08/18/2020. The possibility of sigmoid
volvulus cannot be excluded on today's exam and repeat CT of the
abdomen and pelvis suggested. No free air identified. Right
hemidiaphragm incompletely imaged. No acute bony abnormality.
IMPRESSION: Colonic distention again noted. Similar findings noted on prior
study including CT of 08/18/2020. The possibly of the sigmoid
volvulus cannot be excluded on today's exam and repeat CT of the
abdomen and pelvis suggested.

These results will be called to the ordering clinician or
representative by the Radiologist Assistant, and communication
documented in the PACS or [REDACTED].

## 2022-05-10 IMAGING — CT CT ABD-PELV W/O CM
2 of 4 series · 16 of 46 positions shown, 18 images · non-contrast
Comparison: 08/18/2020

CLINICAL DATA: Abdominal swelling. Query sigmoid volvulus.
Follow-up scan from [DATE].

EXAM:
CT ABDOMEN AND PELVIS WITHOUT CONTRAST
TECHNIQUE: Multidetector CT imaging of the abdomen and pelvis was performed
following the standard protocol without IV contrast.

[Series 2: axial st · axial · 0.98mm/px · z∈[-771,-306]mm · 13 of 105 slices shown, 15 images]
[im 6/105  soft-tissue]
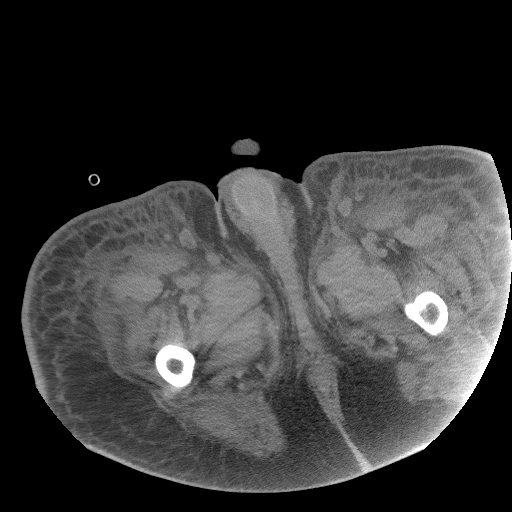
[im 6/105  bone]
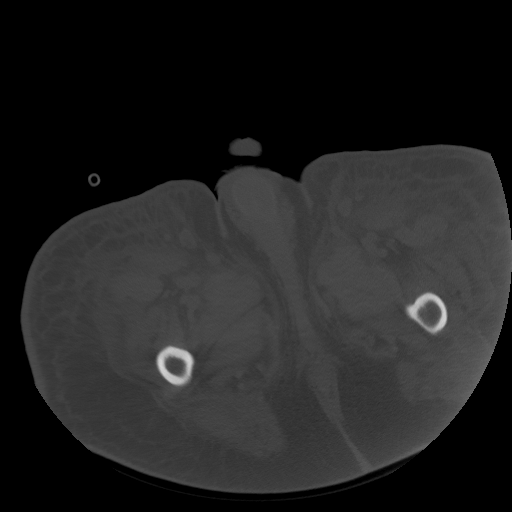
[im 17/105  soft-tissue]
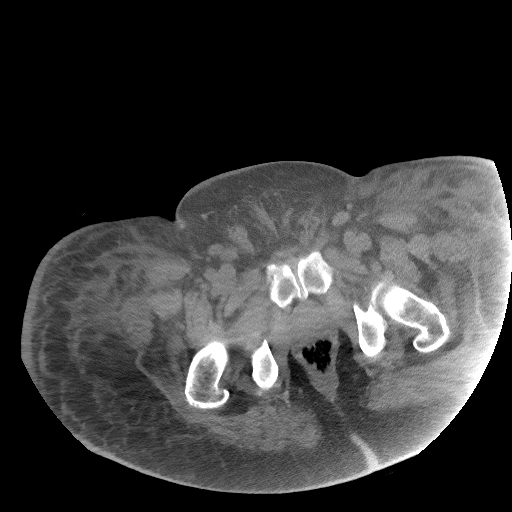
[im 22/105  soft-tissue]
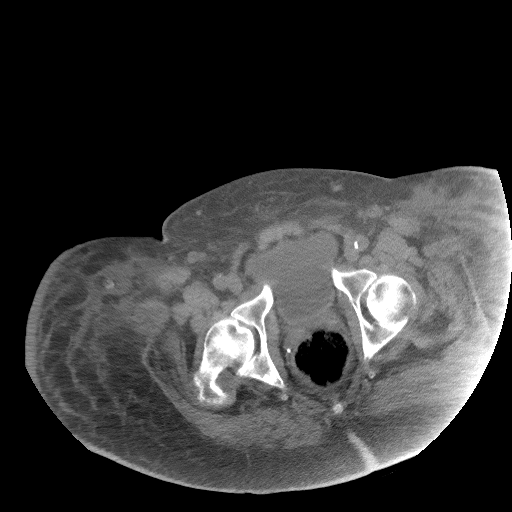
[im 28/105  soft-tissue]
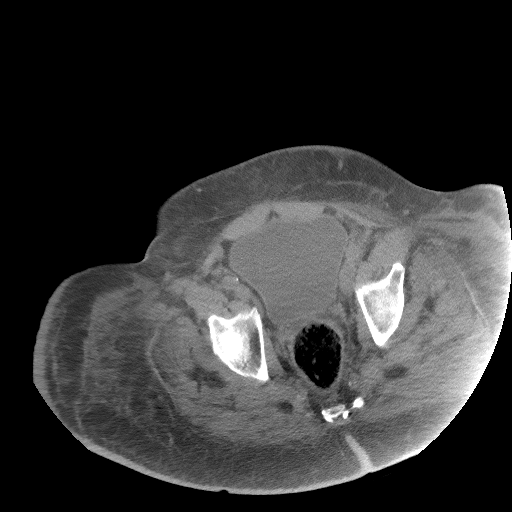
[im 39/105  soft-tissue]
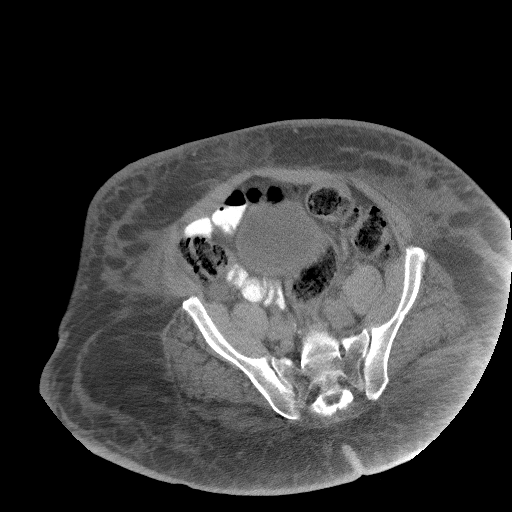
[im 44/105  soft-tissue]
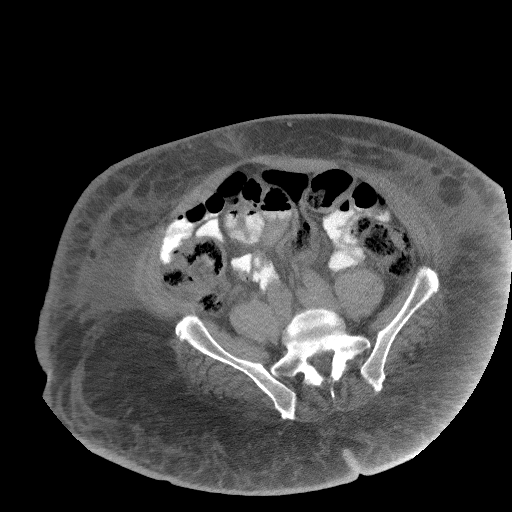
[im 55/105  soft-tissue]
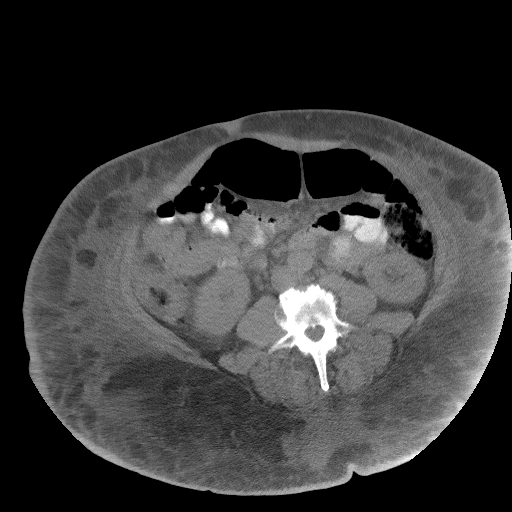
[im 61/105  soft-tissue]
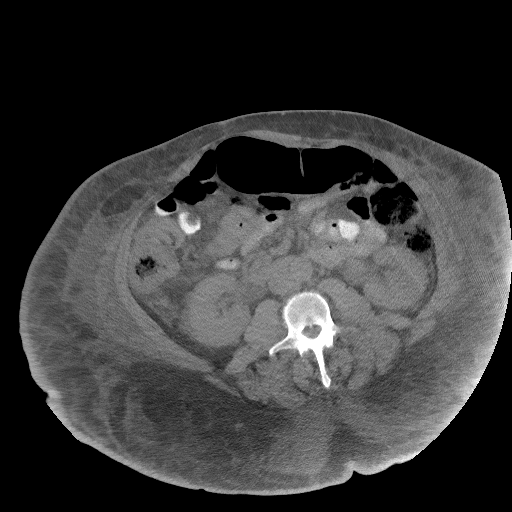
[im 66/105  soft-tissue]
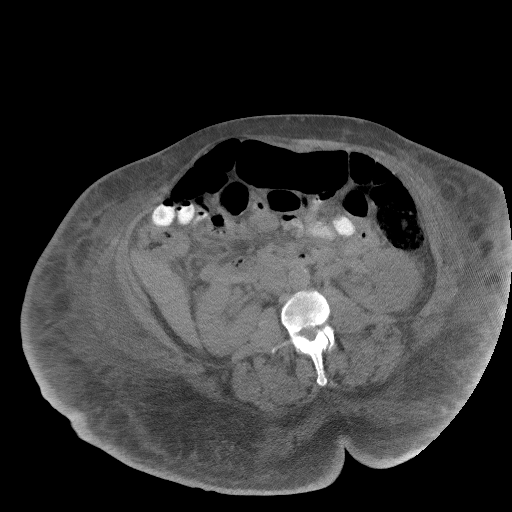
[im 66/105  bone]
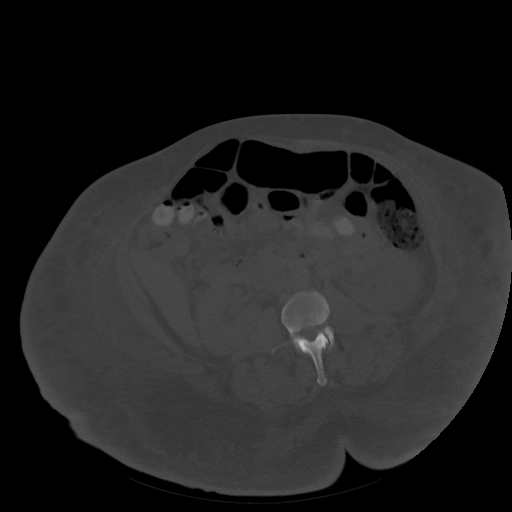
[im 77/105  soft-tissue]
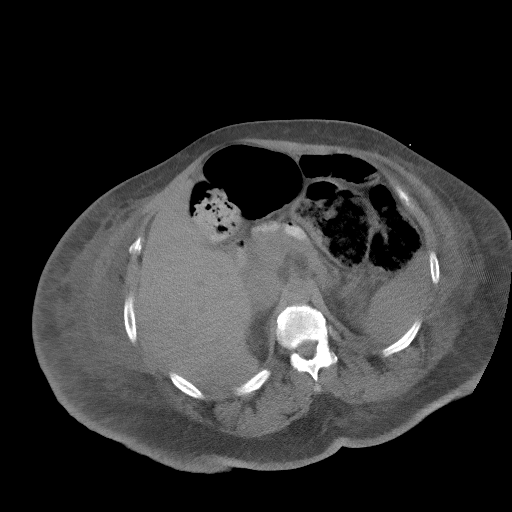
[im 83/105  soft-tissue]
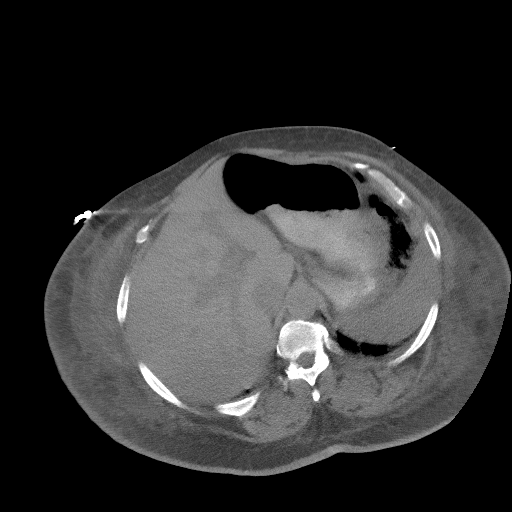
[im 88/105  soft-tissue]
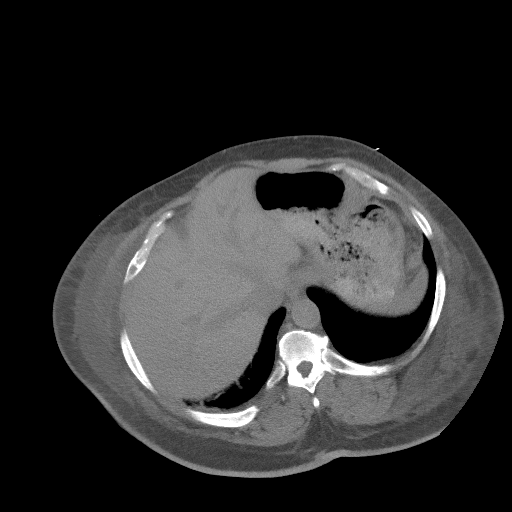
[im 99/105  soft-tissue]
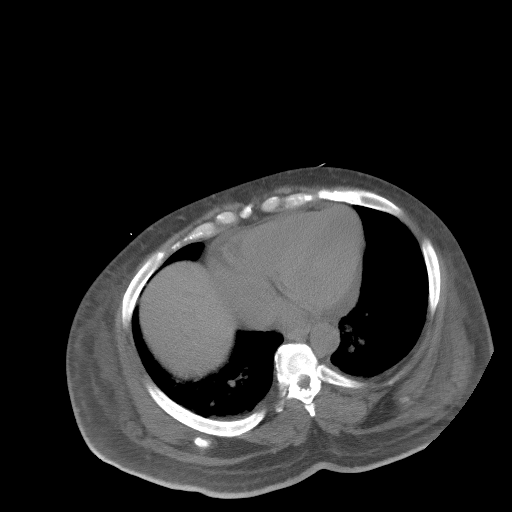

[Series 5: coronal st · coronal · 0.98mm/px · 3 of 112 slices shown]
[im 38/112  soft-tissue]
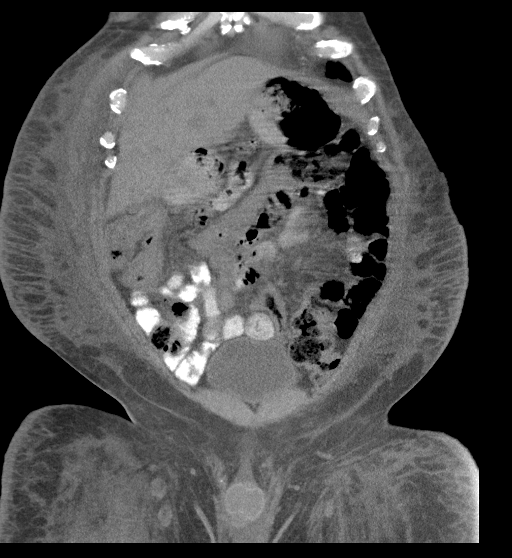
[im 50/112  soft-tissue]
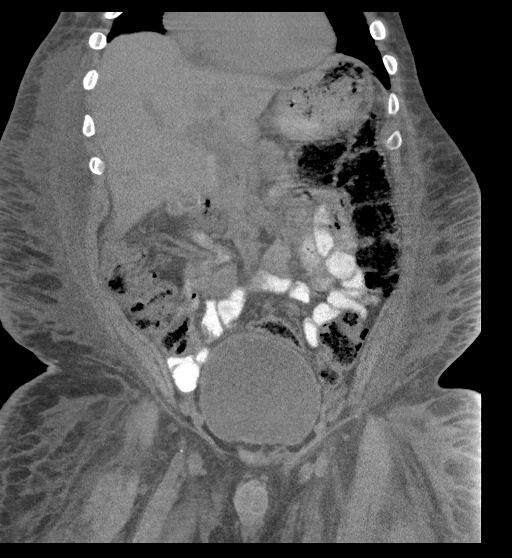
[im 62/112  soft-tissue]
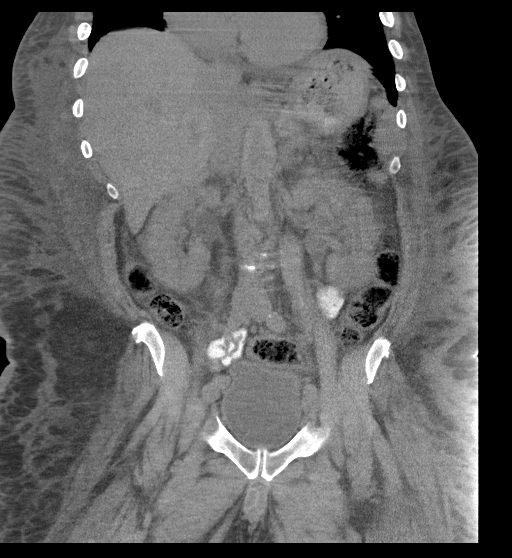

[16 of 46 positions shown; findings below may reference images not displayed]

FINDINGS: Lower chest: Minimal bilateral pleural effusions. Motion artifact
limits evaluation of the lungs but there appears to be some airspace
disease, likely edema. Cardiac enlargement with small pericardial
effusion.

Hepatobiliary: Cholelithiasis with large stone in the gallbladder
neck, unchanged. No focal liver lesions.

Pancreas: Unremarkable. No pancreatic ductal dilatation or
surrounding inflammatory changes.

Spleen: Normal in size without focal abnormality.

Adrenals/Urinary Tract: Adrenal glands are unremarkable. Kidneys are
normal, without renal calculi, focal lesion, or hydronephrosis.
Bladder is unremarkable.

Stomach/Bowel: Stomach, small bowel, and colon are not abnormally
distended. There is gas and stool in the colon but no evidence of
sigmoid volvulus currently. Appendix is not identified.

Vascular/Lymphatic: Aortic atherosclerosis. No enlarged abdominal or
pelvic lymph nodes.

Reproductive: Prostate is unremarkable.

Other: Small amount of free fluid in the pelvis, likely ascites. No
free air. Extensive edema throughout the subcutaneous fat.

Musculoskeletal: Degenerative changes in the spine. No destructive
bone lesions.

Examination is technically limited due to body habitus of the
patient.
IMPRESSION: 1. No evidence of bowel obstruction or sigmoid volvulus.
2. Cholelithiasis.
3. Small bilateral pleural effusions with probable edema in the lung
bases. Cardiac enlargement with small pericardial effusion.
4. Diffuse soft tissue edema throughout the subcutaneous fat.
5. Aortic atherosclerosis.

## 2022-05-29 ENCOUNTER — Ambulatory Visit
Admission: RE | Admit: 2022-05-29 | Discharge: 2022-05-29 | Disposition: A | Discharge status: Home / Self Care | Payer: Medicare (Managed Care) | Source: Ambulatory Visit | Attending: Internal Medicine | Admitting: Internal Medicine

## 2022-05-29 DIAGNOSIS — E041 Nontoxic single thyroid nodule: Secondary | ICD-10-CM

## 2022-06-06 ENCOUNTER — Ambulatory Visit (HOSPITAL_COMMUNITY)
Admission: RE | Admit: 2022-06-06 | Discharge: 2022-06-06 | Disposition: A | Discharge status: Home / Self Care | Payer: Medicare (Managed Care) | Source: Ambulatory Visit | Attending: Internal Medicine | Admitting: Internal Medicine

## 2022-06-06 DIAGNOSIS — E041 Nontoxic single thyroid nodule: Secondary | ICD-10-CM | POA: Insufficient documentation

## 2022-06-19 ENCOUNTER — Non-Acute Institutional Stay: Payer: Medicare (Managed Care) | Admitting: Family Medicine

## 2022-06-19 ENCOUNTER — Encounter: Payer: Self-pay | Admitting: Family Medicine

## 2022-06-19 VITALS — BP 150/90 | HR 78 | Temp 97.8°F | Resp 20

## 2022-06-19 DIAGNOSIS — L8990 Pressure ulcer of unspecified site, unspecified stage: Secondary | ICD-10-CM

## 2022-06-19 DIAGNOSIS — I739 Peripheral vascular disease, unspecified: Secondary | ICD-10-CM

## 2022-06-19 DIAGNOSIS — N184 Chronic kidney disease, stage 4 (severe): Secondary | ICD-10-CM

## 2022-06-19 NOTE — Progress Notes (Signed)
Therapist, nutritional Palliative Care Consult Note Telephone: [phone]  Fax: [phone]   Date of encounter: 06/19/22 1:16 PM PATIENT NAME: [name] [facility] [address] [place] [address]   [phone] (home)  DOB: 1961-01-26 MRN: [id number] PRIMARY CARE PROVIDER:    [name], MD,  [address] [place] [address] [phone]  REFERRING PROVIDER:   [name], MD [address] [place],  [place] [address] [phone]  Emergency Contact:    Contact Information     Name Relation Home Work [address]   [place], [place]   [phone]       Health Care Agent:  Amarrion Pastorino, brother   I met face to face with patient in [facility]. Palliative Care was asked to follow this patient by consultation request of [name], MD to address advance care planning and complex medical decision making. This is a follow up visit.    CODE STATUS: Full code  ASSESSMENT AND / RECOMMENDATIONS:  PPS: 50%  PAD Optimize BP control. Blood sugar control is good for now. Continue Norvasc 10 mg daily, ASA 81 mg daily, Atorvastatin 80 mg nightly. Agree with protein supplement BID to improve wound healing as last albumin 3.3   Pressure ulcer of left BKA stump, unspecified          Stage Needs follow up with wound care to prevent breakdown of stump. Await stump healing before attempting prosthesis trials.   CKD stage 4 Continue follow up with Nephrology as scheduled May need to change Amlodipine to pm administration if pt not having an overnight drop.  Continue Amlodipine 10mg  daily, Carvedilol 12.5 mg BID And Hydralazine-question if need to increase to 75 mg TID for better control. Continue to maintain optimal blood sugar.   Follow up Palliative Care Visit:  Palliative Care continuing to follow up by monitoring for changes in appetite, weight, functional and cognitive  status for chronic disease progression and management in agreement with patient's stated goals of care. Next visit in 3-4 weeks or prn.  This visit was coded based on medical decision making (MDM).  Chief Complaint  Palliative Care continues to follow pt for medical management of chronic diastolic CHF with c/o resting chest pressure and dyspnea as well as hallucinations "from my depression medicine."  HISTORY OF PRESENT ILLNESS: [name]  is a 62 y.o. year old male with chronic diastolic heart failure, DM and HTN. Pt states he is having "chest pressure" that occurs in the evenings and will become short of breath.  He does not have any indigestion symptoms and does not notice any relationship between timing of pressure to eating.  If he lays down and gets quiet then the pain and dyspnea subside.  He does not notice this with exertion. He reports having an area of skin breakdown "on the back of my nub" referring to his amputated leg.  Denies constipation, falls but says he gets very tired of sitting in the chair all day and waiting to be put back in the bed.  Last HGB A1c on 03/18/22 was 6.1%.   ACTIVITIES OF DAILY LIVING: CONTINENT OF BLADDER/BOWEL? Yes BATHING/DRESSING/FEEDING: Requires minimal assistance with bathing and dressing (primarily for hard to reach areas)  MOBILITY:   WHEELCHAIR or bedbound  APPETITE? good WEIGHT:  no recent weight   CURRENT PROBLEM LIST:  Patient Active Problem List   Diagnosis Date Noted  DNR (do not resuscitate) 03/31/2022   Need for emotional support 03/30/2022   Counseling and coordination of care 03/30/2022   History of CVA (cerebrovascular accident) 03/30/2022   Palliative care encounter 03/30/2022   Acute on chronic diastolic CHF (congestive heart failure) 03/17/2022   Acute respiratory failure with hypoxia 03/17/2022   History of left below knee amputation 02/25/2022   Swelling of arm 10/24/2021   Multiple thyroid nodules 10/19/2021   Chronic  heart failure with preserved ejection fraction (HFpEF) 10/19/2021   Diabetic foot infection 03/01/2021   PAD (peripheral artery disease) 03/01/2021   Glaucoma (increased eye pressure) 03/01/2021   Pressure injury of skin 11/19/2020   Depression with anxiety 11/18/2020   BPH (benign prostatic hyperplasia) 11/18/2020   CVA (HCC) with residual right-sided weakness 08/22/2020   TIA (transient ischemic attack) 07/03/2020   Pulmonary artery hypertension 06/25/2020   CKD (chronic kidney disease) stage 4, GFR 15-29 ml/min 06/25/2020   History of cerebrovascular accident (CVA) with residual deficit 06/25/2020   Morbid (severe) obesity due to excess calories 03/30/2020   Malnutrition of moderate degree 01/25/2020   Palliative care by specialist    Goals of care, counseling/discussion    Nephrotic syndrome 01/06/2020   Anasarca 01/06/2020   Generalized body aches 01/01/2020   Chronic diastolic CHF (congestive heart failure) 01/01/2020   Iron deficiency anemia 01/01/2020   Uncontrolled type 2 diabetes mellitus with hyperglycemia    Pure hypercholesterolemia    Nonobstructive atherosclerosis of coronary artery 09/28/2017   MDD (major depressive disorder), recurrent episode, severe 07/30/2017   Osteoarthritis 04/12/2015   Substance-related disorder 10/30/2014   Patient's noncompliance with other medical treatment and regimen 06/07/2014   Chronic pain 05/31/2014   History of noncompliance with medical treatment 05/31/2014   Malingering 05/31/2014   Diabetic neuropathy associated with diabetes mellitus due to underlying condition 05/31/2014   Depression, major, recurrent, severe with psychosis (HCC) 08/29/2013   Type 2 diabetes mellitus with stage 4 chronic kidney disease and hypertension 06/30/2013   Essential hypertension 12/09/2011   PAST MEDICAL HISTORY:  Active Ambulatory Problems    Diagnosis Date Noted   Essential hypertension 12/09/2011   Type 2 diabetes mellitus with stage 4 chronic  kidney disease and hypertension 06/30/2013   Depression, major, recurrent, severe with psychosis (HCC) 08/29/2013   MDD (major depressive disorder), recurrent episode, severe 07/30/2017   Nonobstructive atherosclerosis of coronary artery 09/28/2017   Chronic pain 05/31/2014   History of noncompliance with medical treatment 05/31/2014   Malingering 05/31/2014   Osteoarthritis 04/12/2015   Patient's noncompliance with other medical treatment and regimen 06/07/2014   Substance-related disorder 10/30/2014   Diabetic neuropathy associated with diabetes mellitus due to underlying condition 05/31/2014   Pure hypercholesterolemia    Uncontrolled type 2 diabetes mellitus with hyperglycemia    Generalized body aches 01/01/2020   Chronic diastolic CHF (congestive heart failure) 01/01/2020   Iron deficiency anemia 01/01/2020   Nephrotic syndrome 01/06/2020   Anasarca 01/06/2020   Palliative care by specialist    Goals of care, counseling/discussion    Malnutrition of moderate degree 01/25/2020   Morbid (severe) obesity due to excess calories 03/30/2020   Pulmonary artery hypertension 06/25/2020   CKD (chronic kidney disease) stage 4, GFR 15-29 ml/min 06/25/2020   History of cerebrovascular accident (CVA) with residual deficit 06/25/2020   TIA (transient ischemic attack) 07/03/2020   CVA (HCC) with residual right-sided weakness 08/22/2020   Depression with anxiety 11/18/2020   BPH (benign prostatic hyperplasia) 11/18/2020   Pressure injury of  skin 11/19/2020   Diabetic foot infection 03/01/2021   PAD (peripheral artery disease) 03/01/2021   Glaucoma (increased eye pressure) 03/01/2021   Multiple thyroid nodules 10/19/2021   Chronic heart failure with preserved ejection fraction (HFpEF) 10/19/2021   Swelling of arm 10/24/2021   History of left below knee amputation 02/25/2022   Acute on chronic diastolic CHF (congestive heart failure) 03/17/2022   Acute respiratory failure with hypoxia  03/17/2022   Need for emotional support 03/30/2022   Counseling and coordination of care 03/30/2022   History of CVA (cerebrovascular accident) 03/30/2022   Palliative care encounter 03/30/2022   DNR (do not resuscitate) 03/31/2022   Resolved Ambulatory Problems    Diagnosis Date Noted   Episodic mood disorder 05/13/2011   Atypical chest pain 12/09/2011   Recurrent major depression-severe 08/28/2013   Suicidal ideations 08/28/2013   Cocaine dependence, uncomplicated 05/31/2014   Severe recurrent major depression with psychotic features 01/04/2015   Hyperglycemia 09/28/2017   Cocaine abuse 05/31/2014   Leukopenia 05/31/2014   Diabetic neuropathy 05/31/2014   Hypertensive urgency 12/31/2019   Hypokalemia 12/31/2019   Elevated CK    Non-traumatic rhabdomyolysis 01/01/2020   Hypokalemia due to loss of potassium 01/04/2020   CKD stage IIIa (HCC) 03/30/2020   Chest pain 06/25/2020   Acute on chronic heart failure with preserved ejection fraction    Acute on chronic diastolic CHF (congestive heart failure) 08/22/2020   AKI (acute kidney injury) 08/22/2020   Pericardial effusion 08/22/2020   Acute on chronic diastolic CHF (congestive heart failure) 08/22/2020   Ileus 11/18/2020   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   Acute renal failure superimposed on stage 3a chronic kidney disease 11/18/2020   Abdominal pain, generalized 11/19/2020   Hypotension 12/07/2020   Fever 10/19/2021   Pneumonia 10/20/2021   Community acquired pneumonia    Metabolic acidosis 10/23/2021   Abdominal pain 10/28/2021   Right upper lobe pneumonia 12/08/2021   Bacteremia due to Enterococcus 12/09/2021   Past Medical History:  Diagnosis Date   Arthritis    Bipolar affective    CHF (congestive heart failure)    Claustrophobia    Depression    Diabetes mellitus    Heart murmur    Hypertension    SOCIAL HX:  Social History   Tobacco Use   Smoking status: Former   Smokeless tobacco: Never  Substance Use  Topics   Alcohol use: No   FAMILY HX:  Family History  Problem Relation Age of Onset   Diabetes Other    Cancer Mother        uterus; deceased    Heart attack Mother 47   Hypertension Other    Heart attack Father 1   Stroke Sister        12/2012 age 55 y.o       Preferred Pharmacy: ALLERGIES:  Allergies  Allergen Reactions   Other Anaphylaxis, Itching, Swelling and Other (See Comments)    Grits - swelling     PERTINENT MEDICATIONS:  Outpatient Encounter Medications as of 06/19/2022  Medication Sig   acetaminophen (TYLENOL) 650 MG CR tablet Take 650 mg by mouth every 4 (four) hours as needed for pain.   albuterol (VENTOLIN HFA) 108 (90 Base) MCG/ACT inhaler Inhale 2 puffs into the lungs every 6 (six) hours as needed for wheezing or shortness of breath.   amLODipine (NORVASC) 10 MG tablet Take 1 tablet (10 mg total) by mouth daily.   ascorbic acid (VITAMIN C) 500 MG tablet Take 500 mg by mouth  2 (two) times daily.   aspirin 81 MG chewable tablet Chew 81 mg by mouth daily.   atorvastatin (LIPITOR) 80 MG tablet Take 80 mg by mouth at bedtime.   brimonidine (ALPHAGAN) 0.2 % ophthalmic solution Place 1 drop into both eyes 2 (two) times daily.   carvedilol (COREG) 12.5 MG tablet Take 1 tablet (12.5 mg total) by mouth 2 (two) times daily with a meal.   docusate sodium (COLACE) 100 MG capsule Take 100 mg by mouth at bedtime.   dorzolamide (TRUSOPT) 2 % ophthalmic solution Place 1 drop into both eyes 2 (two) times daily.   ferrous sulfate 325 (65 FE) MG tablet Take 1 tablet (325 mg total) by mouth daily.   furosemide (LASIX) 40 MG tablet Take 1 tablet (40 mg total) by mouth 2 (two) times daily as needed for edema or fluid.   furosemide (LASIX) 80 MG tablet Take 1 tablet (80 mg total) by mouth 2 (two) times daily.   hydrALAZINE (APRESOLINE) 50 MG tablet Take 1 tablet (50 mg total) by mouth every 8 (eight) hours.   insulin aspart (NOVOLOG FLEXPEN) 100 UNIT/ML FlexPen Inject 2-10 Units  into the skin 3 (three) times daily with meals. Inject per sliding scale   Insulin Glargine (BASAGLAR KWIKPEN) 100 UNIT/ML Inject 10 Units into the skin daily.   latanoprost (XALATAN) 0.005 % ophthalmic solution Place 1 drop into both eyes at bedtime.   Multiple Vitamin (MULTIVITAMIN) tablet Take 1 tablet by mouth daily.   Nutritional Supplements (FEEDING SUPPLEMENT, NEPRO CARB STEADY,) LIQD Take 237 mLs by mouth at bedtime.   ondansetron (ZOFRAN) 4 MG tablet Take 4 mg by mouth every 4 (four) hours as needed for nausea or vomiting.   pantoprazole (PROTONIX) 40 MG tablet Take 1 tablet (40 mg total) by mouth daily.   pregabalin (LYRICA) 75 MG capsule Take 1 capsule (75 mg total) by mouth daily.   PROTEIN PO Take 30 mLs by mouth 2 (two) times daily.   saccharomyces boulardii (FLORASTOR) 250 MG capsule Take 1 capsule (250 mg total) by mouth 2 (two) times daily.   sertraline (ZOLOFT) 50 MG tablet Take 50 mg by mouth every other day.   sodium bicarbonate 650 MG tablet Take 2 tablets (1,300 mg total) by mouth 2 (two) times daily.   tamsulosin (FLOMAX) 0.4 MG CAPS capsule Take 0.8 mg by mouth every evening.   Vitamin D, Ergocalciferol, (DRISDOL) 1.25 MG (50000 UNIT) CAPS capsule Take 50,000 Units by mouth every 30 (thirty) days. 17th of each month   zinc sulfate 220 (50 Zn) MG capsule Take 220 mg by mouth daily.   No facility-administered encounter medications on file as of 06/19/2022.    History obtained from review of EMR, discussion with facility staff/caregiver and/or patient.   CBC    Component Value Date/Time   WBC 3.8 (L) 04/02/2022 0811   RBC 3.09 (L) 04/02/2022 0811   HGB 8.1 (L) 04/02/2022 0811   HGB 13.9 12/31/2013 1336   HCT 26.7 (L) 04/02/2022 0811   HCT 43.0 12/31/2013 1336   PLT 234 04/02/2022 0811   PLT 184 12/31/2013 1336   MCV 86.4 04/02/2022 0811   MCV 81 12/31/2013 1336   MCH 26.2 04/02/2022 0811   MCHC 30.3 04/02/2022 0811   RDW 18.6 (H) 04/02/2022 0811   RDW 14.4  12/31/2013 1336   LYMPHSABS 1.2 03/18/2022 0404   MONOABS 0.4 03/18/2022 0404   EOSABS 0.2 03/18/2022 0404   BASOSABS 0.0 03/18/2022 0404    CMP  Latest Ref Rng & Units 04/02/2022    8:11 AM 04/01/2022    7:19 AM 03/31/2022    4:53 AM  CMP  Glucose 70 - 99 mg/dL 409  96  811   BUN 6 - 20 mg/dL 99  91  914   Creatinine 0.61 - 1.24 mg/dL 7.82  9.56  2.13   Sodium 135 - 145 mmol/L 135  137  138   Potassium 3.5 - 5.1 mmol/L 3.3  3.6  3.3   Chloride 98 - 111 mmol/L 106  106  108   CO2 22 - 32 mmol/L 18  19  20    Calcium 8.9 - 10.3 mg/dL 8.2  8.5  8.4     LFTs    Latest Ref Rng & Units 03/28/2022    8:00 AM 03/27/2022    8:05 AM 03/26/2022    5:00 AM  Hepatic Function  Albumin 3.5 - 5.0 g/dL 3.2  3.0  3.0     Urinalysis    Component Value Date/Time   COLORURINE STRAW (A) 03/20/2022 0817   APPEARANCEUR CLEAR 03/20/2022 0817   APPEARANCEUR Clear 12/31/2013 2116   LABSPEC 1.009 03/20/2022 0817   LABSPEC 1.032 12/31/2013 2116   PHURINE 5.0 03/20/2022 0817   GLUCOSEU 50 (A) 03/20/2022 0817   GLUCOSEU >=500 12/31/2013 2116   HGBUR NEGATIVE 03/20/2022 0817   BILIRUBINUR NEGATIVE 03/20/2022 0817   BILIRUBINUR Negative 12/31/2013 2116   KETONESUR NEGATIVE 03/20/2022 0817   PROTEINUR >=300 (A) 03/20/2022 0817   UROBILINOGEN 1.0 08/28/2013 1709   NITRITE NEGATIVE 03/20/2022 0817   LEUKOCYTESUR NEGATIVE 03/20/2022 0817   LEUKOCYTESUR Negative 12/31/2013 2116  \  BNP (last 3 results) Recent Labs    03/17/22 1440 03/18/22 0404 03/27/22 0805  BNP 370.1* 575.9* 242.0*     I reviewed available labs, medications, imaging, studies and related documents from the EMR.  Records reviewed and summarized above.   Physical Exam: GENERAL: NAD EENT:  Missing most of his front upper and lower teeth.  Not LUNGS: CTAB, no increased work of breathing, room air CARDIAC:  S1S2, RRR with no MRG, Noted 2+ edema right hand, No cyanosis ABD:  Normo-active BS x 4 quads, soft,  non-tender EXTREMITIES: Normal ROM except right hand contracture, has left BKA, strength equal, No muscle atrophy/subcutaneous fat loss NEURO:  No weakness or cognitive impairment, aphasia-expressive/receptive  PSYCH:  non-anxious affect, A & O x 3  Thank you for the opportunity to participate in the care of Britney Captain. Please call our main office at [phone] if we can be of additional assistance.    Joycelyn Man [name]-C  [email or web address] .[facility]  Phone:  [phone]

## 2022-07-04 IMAGING — CT CT ANGIO CHEST
2 of 6 series · 19 of 36 positions shown · IV contrast (omnipaque)
Comparison: June 25, 2020.

CLINICAL DATA: Chest pain.

EXAM:
CT ANGIOGRAPHY CHEST WITH CONTRAST
TECHNIQUE: Multidetector CT imaging of the chest was performed using the
standard protocol during bolus administration of intravenous
contrast. Multiplanar CT image reconstructions and MIPs were
obtained to evaluate the vascular anatomy.
CONTRAST:  65mL OMNIPAQUE IOHEXOL 350 MG/ML SOLN

[Series 7: pe thins · axial · 0.88mm/px · z∈[+1078,+1322]mm · 18 of 388 slices shown]
[im 20/388  lung]
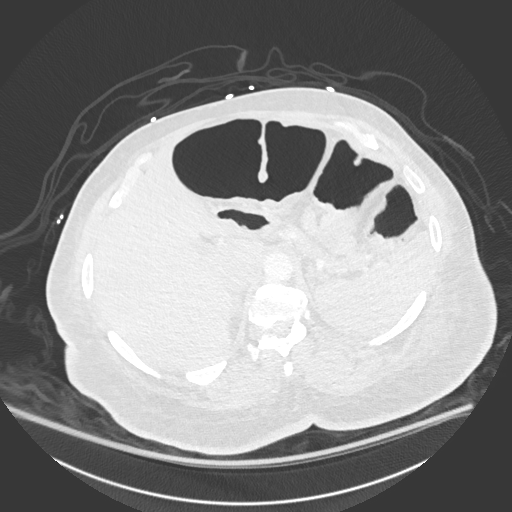
[im 39/388  mediastinal]
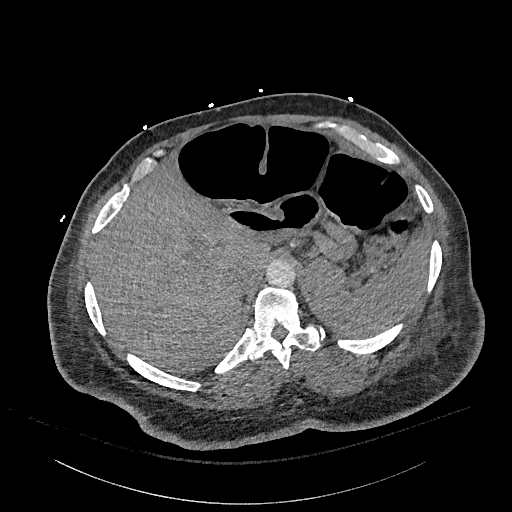
[im 59/388  lung]
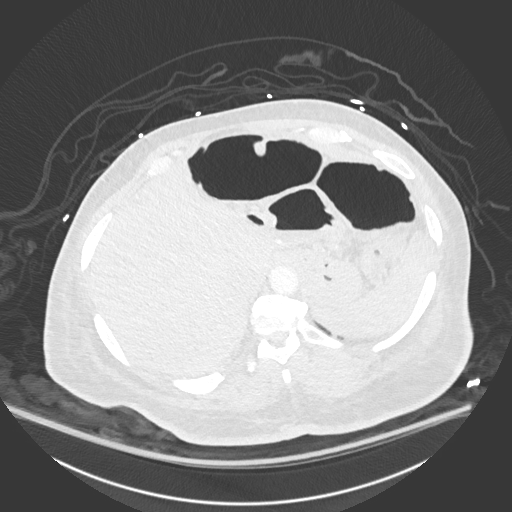
[im 78/388  mediastinal]
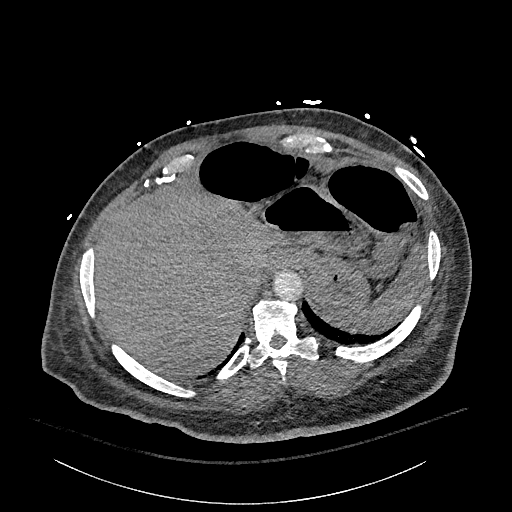
[im 97/388  lung]
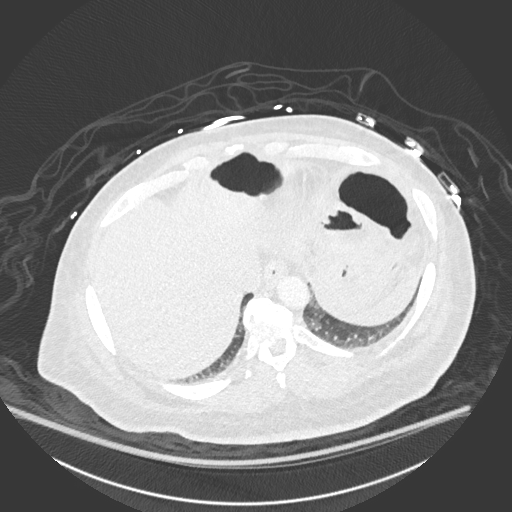
[im 117/388  mediastinal]
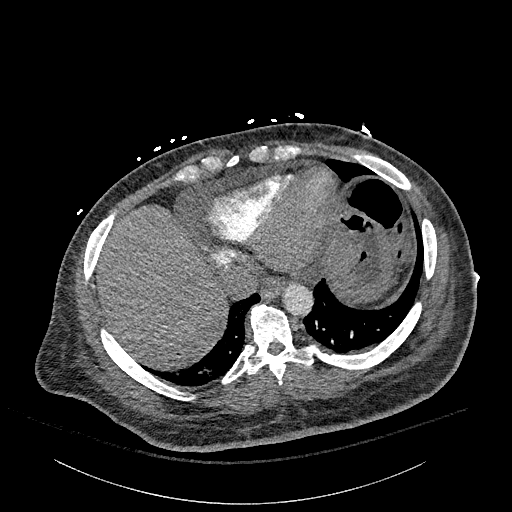
[im 136/388  lung]
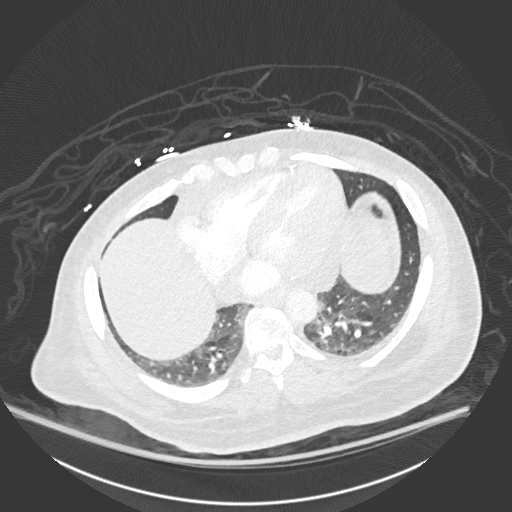
[im 155/388  mediastinal]
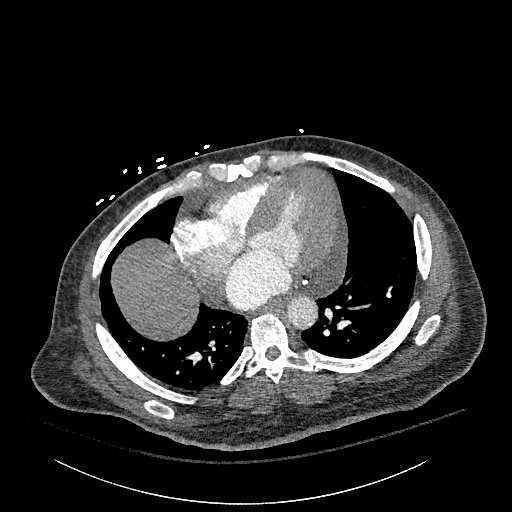
[im 175/388  lung]
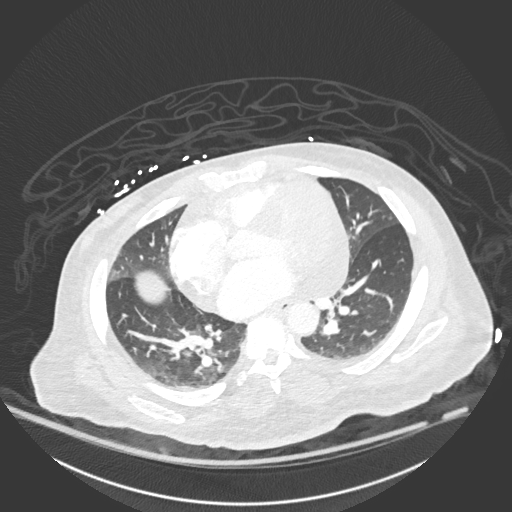
[im 213/388  mediastinal]
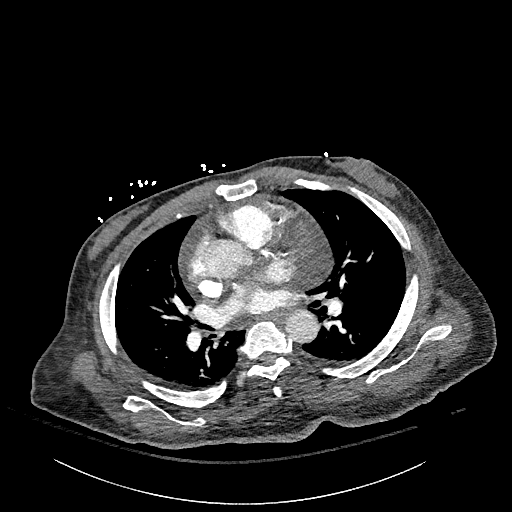
[im 233/388  lung]
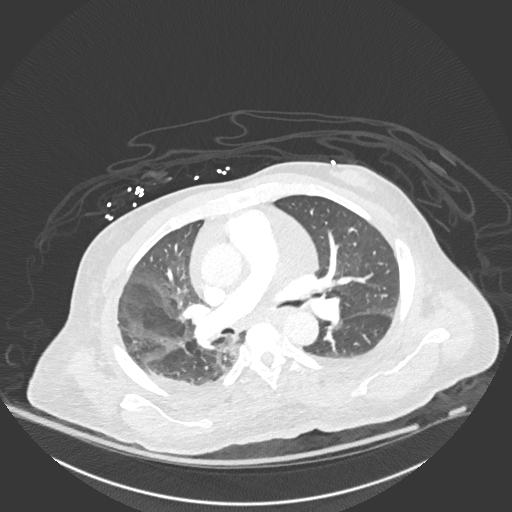
[im 252/388  mediastinal]
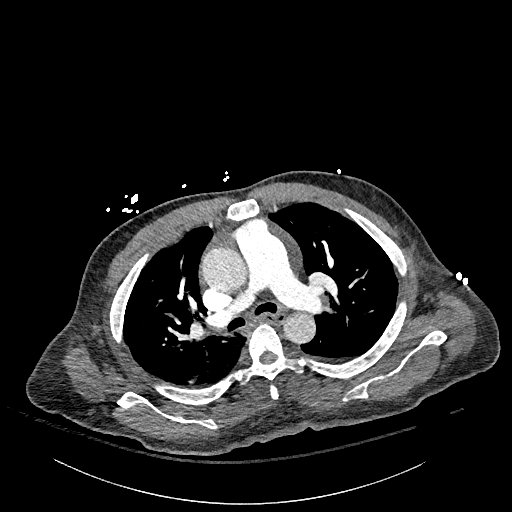
[im 271/388  lung]
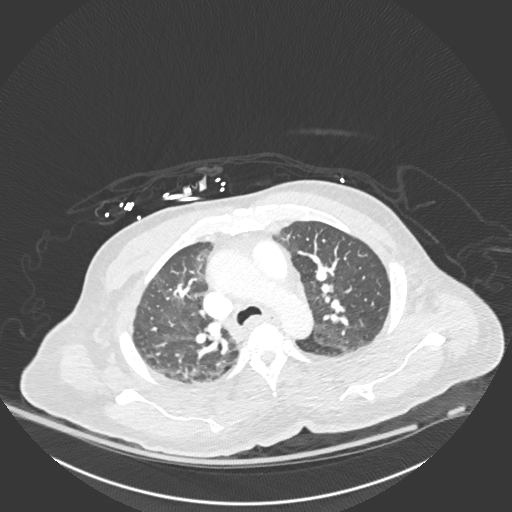
[im 291/388  mediastinal]
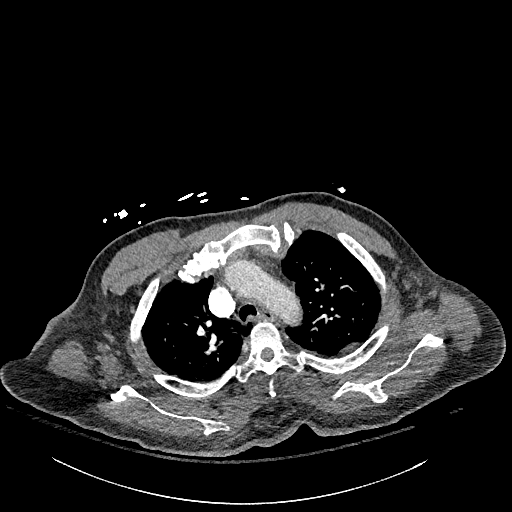
[im 310/388  lung]
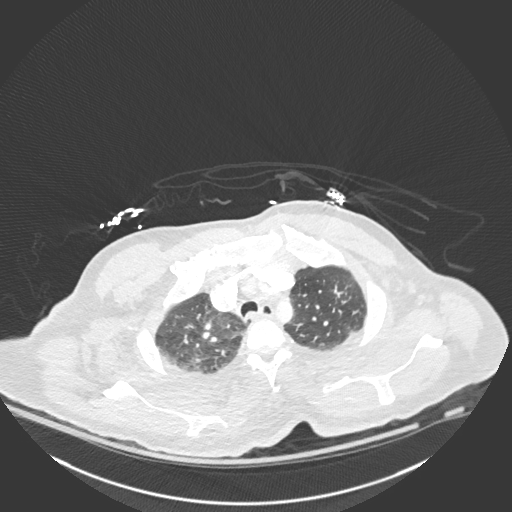
[im 329/388  mediastinal]
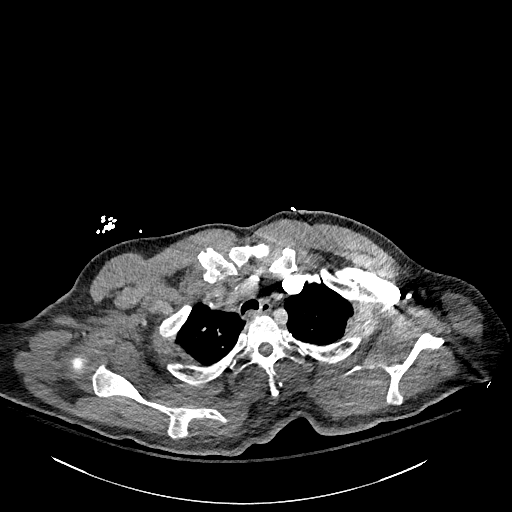
[im 349/388  lung]
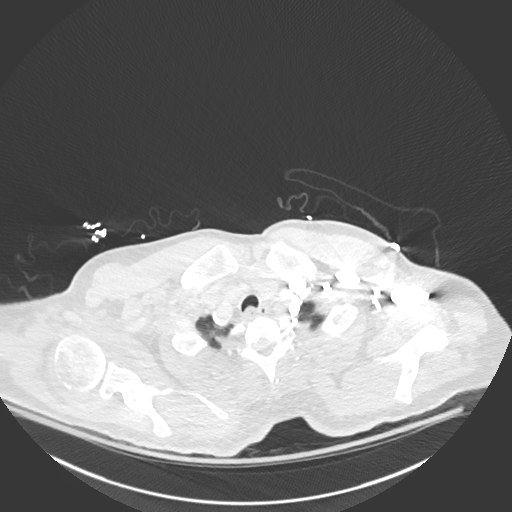
[im 368/388  mediastinal]
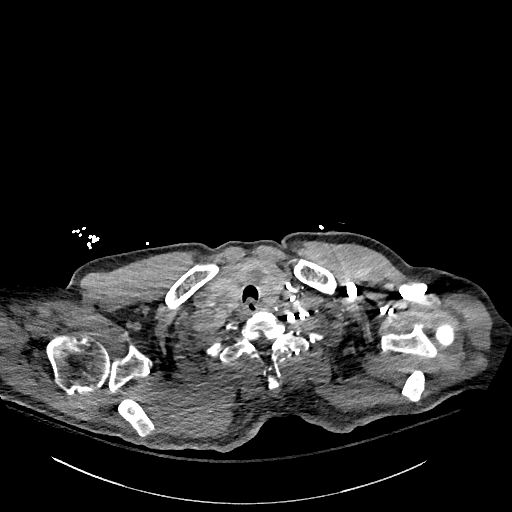

[Series 8: pe 2mm cor · coronal · 0.53mm/px · 1 of 158 slices shown]
[im 79/158  mediastinal]
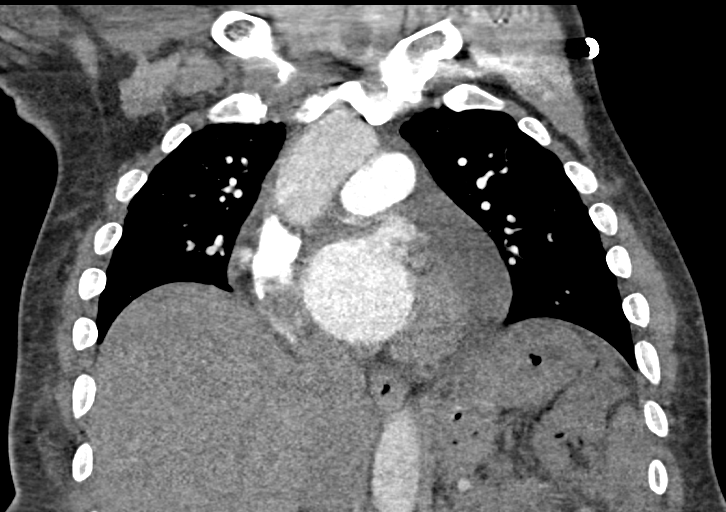

[19 of 36 positions shown; findings below may reference images not displayed]

FINDINGS: Cardiovascular: Satisfactory opacification of the pulmonary arteries
to the segmental level. No evidence of pulmonary embolism. Normal
heart size. Mild pericardial effusion is noted. Mild coronary artery
calcifications are noted. Atherosclerosis of thoracic aorta is
noted.

Mediastinum/Nodes: Multiple thyroid nodules are noted, the largest
measuring 2.1 cm in right thyroid lobe. Esophagus is unremarkable.
No adenopathy is noted.

Lungs/Pleura: No pneumothorax or pleural effusion is noted. Minimal
bibasilar subsegmental atelectasis is noted.

Upper Abdomen: No acute abnormality.

Musculoskeletal: No chest wall abnormality. No acute or significant
osseous findings.

Review of the MIP images confirms the above findings.
IMPRESSION: No definite evidence of pulmonary embolus.

Mild pericardial effusion is noted.

Multiple thyroid nodules are noted, the largest measuring 2.1 cm
right thyroid lobe. Recommend thyroid US. (Ref: [HOSPITAL].
[DATE]): 143-50).

Mild coronary artery calcifications are noted.

Aortic Atherosclerosis ([id number]).

## 2022-07-04 IMAGING — DX DG CHEST 1V PORT
1 series · 1 of 1 positions shown · non-contrast
Comparison: None.

CLINICAL DATA: Chest pain

EXAM:
PORTABLE CHEST 1 VIEW

[chest ap]
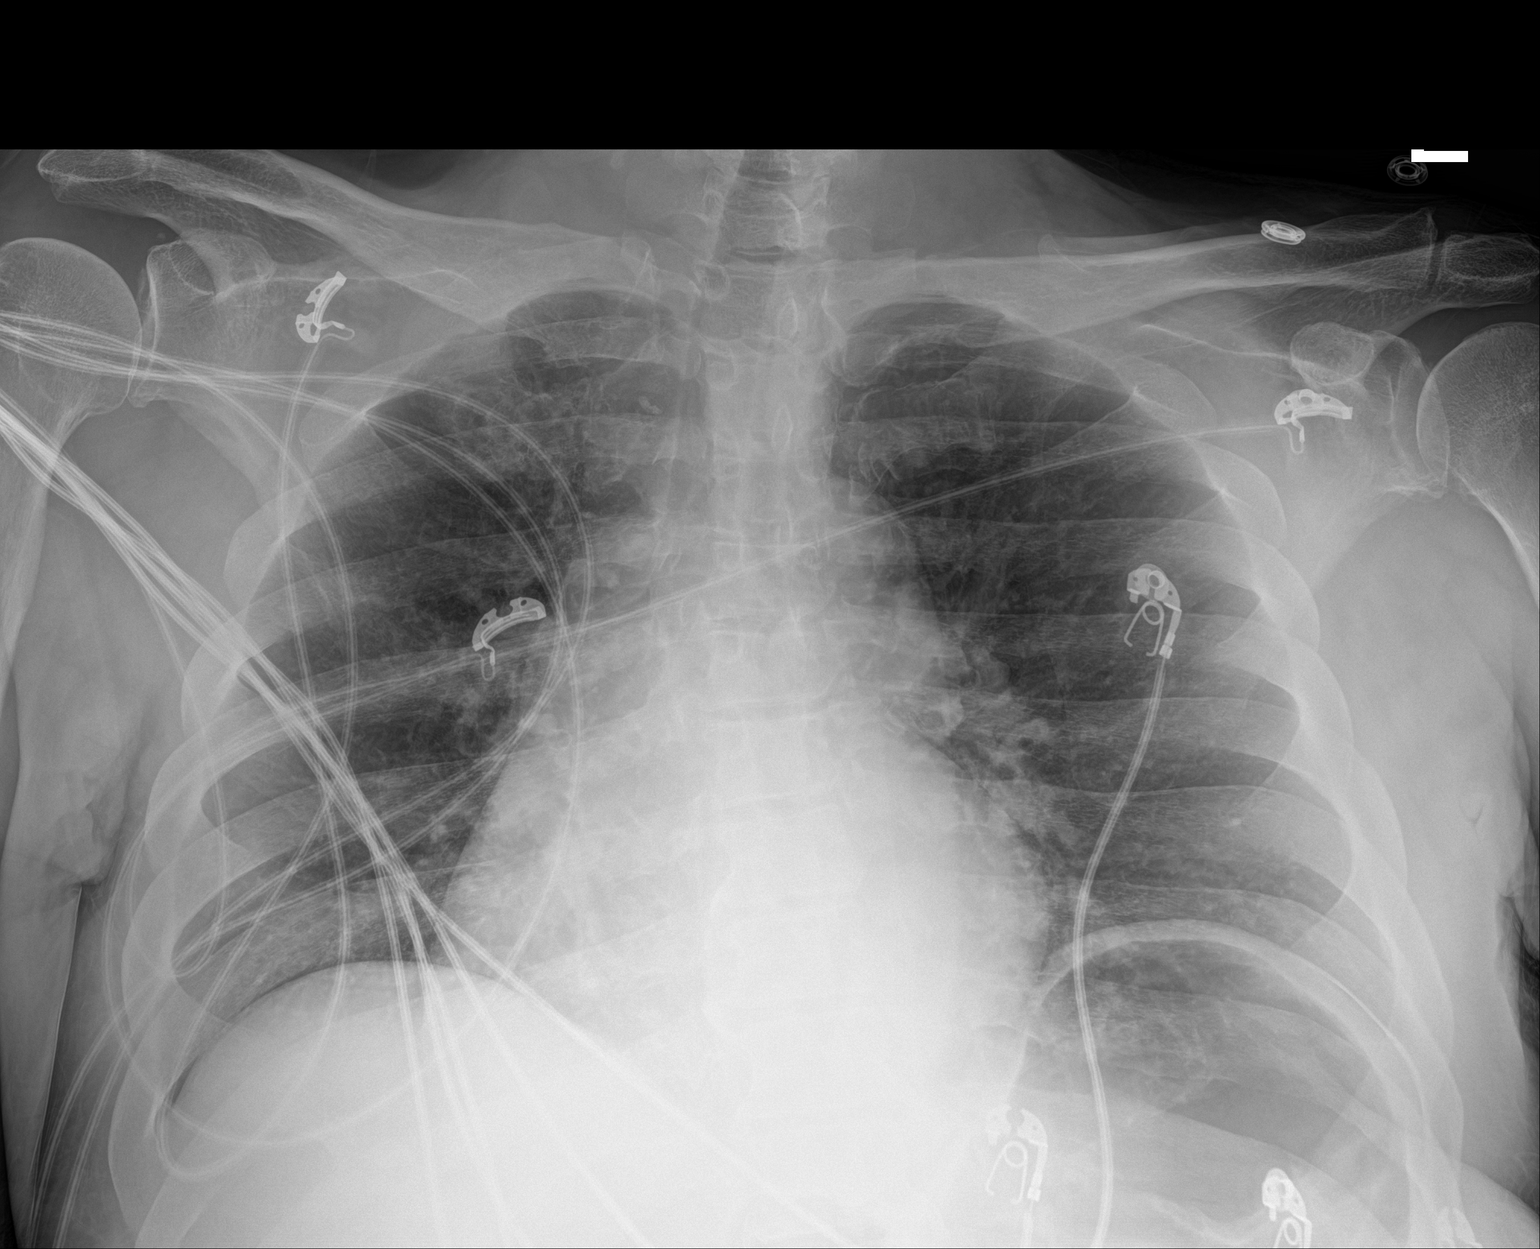

[1 of 1 positions shown; findings below may reference images not displayed]

FINDINGS: Inspiratory volumes are very low. The heart size and mediastinal
contours are within normal limits. Both lungs are clear. The
visualized skeletal structures are unremarkable.
IMPRESSION: Low inspiratory volumes. No large effusion, pneumothorax or focal
airspace opacity.

## 2022-07-17 ENCOUNTER — Inpatient Hospital Stay (HOSPITAL_COMMUNITY)
Admission: EM | Admit: 2022-07-17 | Discharge: 2022-07-20 | DRG: 377 | Disposition: A | Discharge status: Skilled Nursing Facility | Payer: Medicare (Managed Care) | Source: Skilled Nursing Facility | Attending: Internal Medicine | Admitting: Internal Medicine

## 2022-07-17 ENCOUNTER — Emergency Department (HOSPITAL_COMMUNITY): Payer: Medicare (Managed Care)

## 2022-07-17 ENCOUNTER — Other Ambulatory Visit: Payer: Self-pay

## 2022-07-17 ENCOUNTER — Encounter (HOSPITAL_COMMUNITY): Payer: Self-pay | Admitting: Emergency Medicine

## 2022-07-17 DIAGNOSIS — I69351 Hemiplegia and hemiparesis following cerebral infarction affecting right dominant side: Secondary | ICD-10-CM

## 2022-07-17 DIAGNOSIS — F319 Bipolar disorder, unspecified: Secondary | ICD-10-CM | POA: Diagnosis present

## 2022-07-17 DIAGNOSIS — E1169 Type 2 diabetes mellitus with other specified complication: Secondary | ICD-10-CM

## 2022-07-17 DIAGNOSIS — Z833 Family history of diabetes mellitus: Secondary | ICD-10-CM

## 2022-07-17 DIAGNOSIS — I5033 Acute on chronic diastolic (congestive) heart failure: Secondary | ICD-10-CM | POA: Diagnosis not present

## 2022-07-17 DIAGNOSIS — I509 Heart failure, unspecified: Secondary | ICD-10-CM

## 2022-07-17 DIAGNOSIS — D61818 Other pancytopenia: Secondary | ICD-10-CM | POA: Diagnosis present

## 2022-07-17 DIAGNOSIS — I130 Hypertensive heart and chronic kidney disease with heart failure and stage 1 through stage 4 chronic kidney disease, or unspecified chronic kidney disease: Secondary | ICD-10-CM | POA: Diagnosis present

## 2022-07-17 DIAGNOSIS — K922 Gastrointestinal hemorrhage, unspecified: Secondary | ICD-10-CM | POA: Diagnosis not present

## 2022-07-17 DIAGNOSIS — Z794 Long term (current) use of insulin: Secondary | ICD-10-CM

## 2022-07-17 DIAGNOSIS — R0902 Hypoxemia: Secondary | ICD-10-CM | POA: Diagnosis present

## 2022-07-17 DIAGNOSIS — Z79899 Other long term (current) drug therapy: Secondary | ICD-10-CM

## 2022-07-17 DIAGNOSIS — L97829 Non-pressure chronic ulcer of other part of left lower leg with unspecified severity: Secondary | ICD-10-CM | POA: Diagnosis present

## 2022-07-17 DIAGNOSIS — E1151 Type 2 diabetes mellitus with diabetic peripheral angiopathy without gangrene: Secondary | ICD-10-CM | POA: Diagnosis present

## 2022-07-17 DIAGNOSIS — N400 Enlarged prostate without lower urinary tract symptoms: Secondary | ICD-10-CM | POA: Diagnosis present

## 2022-07-17 DIAGNOSIS — D649 Anemia, unspecified: Principal | ICD-10-CM

## 2022-07-17 DIAGNOSIS — Z8249 Family history of ischemic heart disease and other diseases of the circulatory system: Secondary | ICD-10-CM

## 2022-07-17 DIAGNOSIS — Z7982 Long term (current) use of aspirin: Secondary | ICD-10-CM

## 2022-07-17 DIAGNOSIS — E1122 Type 2 diabetes mellitus with diabetic chronic kidney disease: Secondary | ICD-10-CM | POA: Diagnosis present

## 2022-07-17 DIAGNOSIS — R079 Chest pain, unspecified: Secondary | ICD-10-CM | POA: Diagnosis present

## 2022-07-17 DIAGNOSIS — N184 Chronic kidney disease, stage 4 (severe): Secondary | ICD-10-CM | POA: Insufficient documentation

## 2022-07-17 DIAGNOSIS — E8722 Chronic metabolic acidosis: Secondary | ICD-10-CM | POA: Diagnosis present

## 2022-07-17 DIAGNOSIS — F40240 Claustrophobia: Secondary | ICD-10-CM | POA: Diagnosis present

## 2022-07-17 DIAGNOSIS — Z89512 Acquired absence of left leg below knee: Secondary | ICD-10-CM

## 2022-07-17 DIAGNOSIS — Z6841 Body Mass Index (BMI) 40.0 and over, adult: Secondary | ICD-10-CM

## 2022-07-17 DIAGNOSIS — D469 Myelodysplastic syndrome, unspecified: Secondary | ICD-10-CM | POA: Insufficient documentation

## 2022-07-17 DIAGNOSIS — Z823 Family history of stroke: Secondary | ICD-10-CM

## 2022-07-17 DIAGNOSIS — E119 Type 2 diabetes mellitus without complications: Secondary | ICD-10-CM

## 2022-07-17 DIAGNOSIS — D62 Acute posthemorrhagic anemia: Secondary | ICD-10-CM

## 2022-07-17 DIAGNOSIS — Z87891 Personal history of nicotine dependence: Secondary | ICD-10-CM

## 2022-07-17 DIAGNOSIS — E785 Hyperlipidemia, unspecified: Secondary | ICD-10-CM | POA: Diagnosis present

## 2022-07-17 LAB — COMPREHENSIVE METABOLIC PANEL
ALT: 18 U/L (ref 0–44)
AST: 19 U/L (ref 15–41)
Albumin: 3.1 g/dL — ABNORMAL LOW (ref 3.5–5.0)
Alkaline Phosphatase: 94 U/L (ref 38–126)
Anion gap: 11 (ref 5–15)
BUN: 68 mg/dL — ABNORMAL HIGH (ref 6–20)
CO2: 19 mmol/L — ABNORMAL LOW (ref 22–32)
Calcium: 8.2 mg/dL — ABNORMAL LOW (ref 8.9–10.3)
Chloride: 108 mmol/L (ref 98–111)
Creatinine, Ser: 3.8 mg/dL — ABNORMAL HIGH (ref 0.61–1.24)
GFR, Estimated: 17 mL/min — ABNORMAL LOW (ref >60)
Glucose, Bld: 125 mg/dL — ABNORMAL HIGH (ref 70–99)
Potassium: 4.6 mmol/L (ref 3.5–5.1)
Sodium: 138 mmol/L (ref 135–145)
Total Bilirubin: 0.7 mg/dL (ref 0.3–1.2)
Total Protein: 8.2 g/dL — ABNORMAL HIGH (ref 6.5–8.1)

## 2022-07-17 LAB — CBC WITH DIFFERENTIAL/PLATELET
Abs Immature Granulocytes: 0.15 10*3/uL — ABNORMAL HIGH (ref 0.00–0.07)
Basophils Absolute: 0 10*3/uL (ref 0.0–0.1)
Basophils Relative: 1 %
Eosinophils Absolute: 0.1 10*3/uL (ref 0.0–0.5)
Eosinophils Relative: 6 %
HCT: 19.9 % — ABNORMAL LOW (ref 39.0–52.0)
Hemoglobin: 6 g/dL — CL (ref 13.0–17.0)
Immature Granulocytes: 8 %
Lymphocytes Relative: 30 %
Lymphs Abs: 0.5 10*3/uL — ABNORMAL LOW (ref 0.7–4.0)
MCH: 26.2 pg (ref 26.0–34.0)
MCHC: 30.2 g/dL (ref 30.0–36.0)
MCV: 86.9 fL (ref 80.0–100.0)
Monocytes Absolute: 0.1 10*3/uL (ref 0.1–1.0)
Monocytes Relative: 6 %
Neutro Abs: 0.9 10*3/uL — ABNORMAL LOW (ref 1.7–7.7)
Neutrophils Relative %: 49 %
Platelets: 85 10*3/uL — ABNORMAL LOW (ref 150–400)
RBC: 2.29 MIL/uL — ABNORMAL LOW (ref 4.22–5.81)
RDW: 20.8 % — ABNORMAL HIGH (ref 11.5–15.5)
WBC: 1.8 10*3/uL — ABNORMAL LOW (ref 4.0–10.5)
nRBC: 0 % (ref 0.0–0.2)

## 2022-07-17 LAB — CBG MONITORING, ED: Glucose-Capillary: 123 mg/dL — ABNORMAL HIGH (ref 70–99)

## 2022-07-17 LAB — TROPONIN I (HIGH SENSITIVITY)
Troponin I (High Sensitivity): 25 ng/L — ABNORMAL HIGH (ref <18)
Troponin I (High Sensitivity): 11 ng/L (ref <18)

## 2022-07-17 LAB — TYPE AND SCREEN: Antibody Screen: NEGATIVE

## 2022-07-17 LAB — LACTIC ACID, PLASMA
Lactic Acid, Venous: 0.6 mmol/L (ref 0.5–1.9)
Lactic Acid, Venous: <0.3 mmol/L — ABNORMAL LOW (ref 0.5–1.9)

## 2022-07-17 LAB — BPAM RBC: Blood Product Expiration Date: 202406262359

## 2022-07-17 LAB — POC OCCULT BLOOD, ED: Fecal Occult Bld: POSITIVE — AB

## 2022-07-17 LAB — BRAIN NATRIURETIC PEPTIDE: B Natriuretic Peptide: 562.6 pg/mL — ABNORMAL HIGH (ref 0.0–100.0)

## 2022-07-17 LAB — PREPARE RBC (CROSSMATCH)

## 2022-07-17 MED ORDER — SODIUM CHLORIDE 0.9 % IV BOLUS: 1000.0000 mL | Freq: Once | INTRAVENOUS | Status: DC

## 2022-07-17 MED ORDER — INSULIN ASPART 100 UNIT/ML IJ SOLN
0.0000 [IU] | INTRAMUSCULAR | Status: DC
  Administered 2022-07-17: 1 [IU] via SUBCUTANEOUS
  Filled 2022-07-17: qty 0.09

## 2022-07-17 MED ORDER — FUROSEMIDE 10 MG/ML IJ SOLN
40.0000 mg | Freq: Once | INTRAMUSCULAR | Status: AC
  Administered 2022-07-17: 40 mg via INTRAVENOUS
  Filled 2022-07-17: qty 4

## 2022-07-17 MED ORDER — ACETAMINOPHEN 325 MG PO TABS
650.0000 mg | ORAL_TABLET | Freq: Four times a day (QID) | ORAL | Status: DC | PRN
  Administered 2022-07-18 – 2022-07-19 (×3): 650 mg via ORAL
  Filled 2022-07-17 (×4): qty 2

## 2022-07-17 MED ORDER — SODIUM CHLORIDE 0.9% IV SOLUTION: Freq: Once | INTRAVENOUS | Status: DC

## 2022-07-17 MED ORDER — SODIUM CHLORIDE 0.9 % IV SOLN
2.0000 g | Freq: Once | INTRAVENOUS | Status: AC
  Administered 2022-07-17: 2 g via INTRAVENOUS
  Filled 2022-07-17: qty 20

## 2022-07-17 MED ORDER — ACETAMINOPHEN 650 MG RE SUPP: 650.0000 mg | Freq: Four times a day (QID) | RECTAL | Status: DC | PRN

## 2022-07-17 NOTE — H&P (Signed)
History and Physical    [name] NWG:[id number] DOB: [birth year] DOA: 07/17/2022  PCP: [name], MD  Patient coming from: [facility]  Chief Complaint: Shortness of breath  HPI: [name] is a 62 y.o. male with medical history significant of chronic HFpEF, CKD stage IV, insulin-dependent type 2 diabetes, PVD, history of left BKA, CVA with residual right-sided weakness, hypertension, hyperlipidemia, bipolar disorder, cocaine abuse, depression, BPH, morbid obesity.  Patient presents to the ED today via EMS complaining of shortness of breath x 2-3 weeks and also complained of chest pain.  Reportedly diagnosed with bronchitis recently and started on azithromycin and Augmentin.  On arrival to ED, he had increased work of breathing and was placed on 3 L O2 via [place].  He does not use home oxygen.  Afebrile.  Labs showing WBC 1.8 (was 2.0 on labs done 07/09/2022), hemoglobin 6.0 (was 7.6 on labs done 07/09/2022 and baseline 7-8 range), MCV 86.9, platelet count 85k (was 117k on labs done 07/09/2022 but previously normal), sodium 138, potassium 4.6, chloride 108, bicarb 19, BUN 68, creatinine 3.8 (stable), glucose 125, calcium 8.2, albumin 3.1, initial troponin 25 and repeat pending, BNP 562, lactic acid negative x 2, blood cultures collected.  Brown liquid stool noted on rectal exam but FOBT positive.  C. difficile PCR pending.  Chest x-ray showing cardiomegaly with vascular congestion and edema.  Patient received IV Lasix 40 mg, ceftriaxone, and 1 L normal saline bolus.  1 unit PRBCs ordered.  Dr. Marca Ancona with [facility] consulted.    Patient reports 2-week history of shortness of breath.  He is also complaining of substernal nonexertional chest pain.  States he received medications at his nursing facility but they have not helped with his symptoms.  He is not sure which medications he receives at his facility but thinks he has been receiving Lasix.  He is concerned that he has too much fluid in his  body.  States he normally does not use oxygen but for the past few days they have been giving him supplemental oxygen at his facility.  He denies hematemesis, hematochezia, or melena.  No other complaints.  Review of Systems:  Review of Systems  All other systems reviewed and are negative.   Past Medical History:  Diagnosis Date   Acute on chronic diastolic CHF (congestive heart failure) (HCC) 08/22/2020   Acute on chronic heart failure with preserved ejection fraction (HCC)    Acute renal failure superimposed on stage 3a chronic kidney disease (HCC) 11/18/2020   Arthritis    Bacteremia due to Enterococcus 12/09/2021   Bipolar affective (HCC)    CHF (congestive heart failure) (HCC)    Claustrophobia    Severe   Cocaine abuse (HCC) 05/31/2014   Cocaine dependence, uncomplicated (HCC) 05/31/2014   Depression    Diabetes mellitus    uncontrolled    Elevated CK    Heart murmur    Hypertension    Ileus (HCC) 11/18/2020   Metabolic acidosis 10/23/2021   Non-traumatic rhabdomyolysis 01/01/2020   Pericardial effusion 08/22/2020   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   Suicidal ideations 08/28/2013    Past Surgical History:  Procedure Laterality Date   ABDOMINAL AORTOGRAM W/LOWER EXTREMITY Left 03/02/2021   Procedure: ABDOMINAL AORTOGRAM W/LOWER EXTREMITY;  Surgeon: Leonie Douglas, MD;  Location: [facility];  Service: Cardiovascular;  Laterality: Left;   AMPUTATION Left 03/07/2021   Procedure: LEFT BELOW KNEE AMPUTATION;  Surgeon: Heath Lark  N, MD;  Location: [facility] OR;  Service: Vascular;  Laterality: Left;   APPLICATION OF WOUND VAC Left 03/07/2021   Procedure: APPLICATION OF WOUND VAC;  Surgeon: Leonie Douglas, MD;  Location: [facility] OR;  Service: Vascular;  Laterality: Left;   BOWEL DECOMPRESSION N/A 01/19/2020   Procedure: BOWEL DECOMPRESSION;  Surgeon: Kathi Der, MD;  Location: [facility];  Service: Gastroenterology;   Laterality: N/A;   BOWEL DECOMPRESSION N/A 11/19/2020   Procedure: BOWEL DECOMPRESSION;  Surgeon: Charlott Rakes, MD;  Location: [facility];  Service: Endoscopy;  Laterality: N/A;   BOWEL DECOMPRESSION N/A 12/03/2020   Procedure: BOWEL DECOMPRESSION;  Surgeon: Kerin Salen, MD;  Location: [facility];  Service: Gastroenterology;  Laterality: N/A;   COLONOSCOPY WITH PROPOFOL N/A 01/19/2020   Procedure: COLONOSCOPY WITH PROPOFOL;  Surgeon: Kathi Der, MD;  Location: [facility];  Service: Gastroenterology;  Laterality: N/A;   COLONOSCOPY WITH PROPOFOL N/A 11/19/2020   Procedure: COLONOSCOPY WITH PROPOFOL;  Surgeon: Charlott Rakes, MD;  Location: [facility];  Service: Endoscopy;  Laterality: N/A;   COLONOSCOPY WITH PROPOFOL N/A 12/03/2020   Procedure: COLONOSCOPY WITH PROPOFOL;  Surgeon: Kerin Salen, MD;  Location: [facility];  Service: Gastroenterology;  Laterality: N/A;   KNEE SURGERY     b/l knees; pending total knee    RADIOLOGY WITH ANESTHESIA N/A 07/04/2020   Procedure: MRI WITH ANESTHESIA BRAIN WITHOUT CONTRAST AND HEAD WITHOUT CONTRAST;  Surgeon: Radiologist, Medication, MD;  Location: [facility] OR;  Service: Radiology;  Laterality: N/A;   TRANSMETATARSAL AMPUTATION Left 03/01/2021   Procedure: TRANSMETATARSAL AMPUTATION;  Surgeon: Vivi Barrack, DPM;  Location: [facility] OR;  Service: Podiatry;  Laterality: Left;     reports that he has quit smoking. He has never used smokeless tobacco. He reports that he does not currently use drugs after having used the following drugs: Cocaine. He reports that he does not drink alcohol.  Allergies  Allergen Reactions   Other Anaphylaxis, Itching, Swelling and Other (See Comments)    Grits - swelling    Family History  Problem Relation Age of Onset   Diabetes Other    Cancer Mother        uterus; deceased    Heart attack Mother 8   Hypertension Other    Heart attack Father 91   Stroke Sister        12/2012 age 16 y.o    Prior to  Admission medications   Medication Sig Start Date End Date Taking? Authorizing Provider  acetaminophen (TYLENOL) 650 MG CR tablet Take 650 mg by mouth every 4 (four) hours as needed for pain.    [provider]  albuterol (VENTOLIN HFA) 108 (90 Base) MCG/ACT inhaler Inhale 2 puffs into the lungs every 6 (six) hours as needed for wheezing or shortness of breath.    [provider]  amLODipine (NORVASC) 10 MG tablet Take 1 tablet (10 mg total) by mouth daily. 12/11/21   [name], [name], DO  ascorbic acid (VITAMIN C) 500 MG tablet Take 500 mg by mouth 2 (two) times daily.    [provider]  aspirin 81 MG chewable tablet Chew 81 mg by mouth daily.    [provider]  atorvastatin (LIPITOR) 80 MG tablet Take 80 mg by mouth at bedtime.    [provider]  brimonidine (ALPHAGAN) 0.2 % ophthalmic solution Place 1 drop into both eyes 2 (two) times daily.    [provider]  carvedilol (COREG) 12.5 MG tablet Take 1 tablet (12.5 mg total)  by mouth 2 (two) times daily with a meal. 04/02/22   [name], Loura Halt, MD  docusate sodium (COLACE) 100 MG capsule Take 100 mg by mouth at bedtime.    [provider]  dorzolamide (TRUSOPT) 2 % ophthalmic solution Place 1 drop into both eyes 2 (two) times daily.    [provider]  ferrous sulfate 325 (65 FE) MG tablet Take 1 tablet (325 mg total) by mouth daily. 09/23/20   Marguerita Merles Latif, DO  furosemide (LASIX) 40 MG tablet Take 1 tablet (40 mg total) by mouth 2 (two) times daily as needed for edema or fluid. 04/02/22 04/02/23  Dimple Nanas, MD  furosemide (LASIX) 80 MG tablet Take 1 tablet (80 mg total) by mouth 2 (two) times daily. 10/31/21   Alfredo Martinez, MD  hydrALAZINE (APRESOLINE) 50 MG tablet Take 1 tablet (50 mg total) by mouth every 8 (eight) hours. 04/02/22   [name], [name], MD  insulin aspart (NOVOLOG FLEXPEN) 100 UNIT/ML FlexPen Inject 2-10 Units into the skin 3 (three) times daily  with meals. Inject per sliding scale    [provider]  Insulin Glargine (BASAGLAR KWIKPEN) 100 UNIT/ML Inject 10 Units into the skin daily. 04/02/22   [name], [name], MD  latanoprost (XALATAN) 0.005 % ophthalmic solution Place 1 drop into both eyes at bedtime.    [provider]  Multiple Vitamin (MULTIVITAMIN) tablet Take 1 tablet by mouth daily.    [provider]  Nutritional Supplements (FEEDING SUPPLEMENT, NEPRO CARB STEADY,) LIQD Take 237 mLs by mouth at bedtime. 10/31/21   Alfredo Martinez, MD  ondansetron (ZOFRAN) 4 MG tablet Take 4 mg by mouth every 4 (four) hours as needed for nausea or vomiting.    [provider]  pantoprazole (PROTONIX) 40 MG tablet Take 1 tablet (40 mg total) by mouth daily. 10/31/21   Alfredo Martinez, MD  pregabalin (LYRICA) 75 MG capsule Take 1 capsule (75 mg total) by mouth daily. 10/31/21   Alfredo Martinez, MD  PROTEIN PO Take 30 mLs by mouth 2 (two) times daily.    [provider]  saccharomyces boulardii (FLORASTOR) 250 MG capsule Take 1 capsule (250 mg total) by mouth 2 (two) times daily. 09/07/20   Lanae Boast, MD  sertraline (ZOLOFT) 50 MG tablet Take 50 mg by mouth every other day.    [provider]  sodium bicarbonate 650 MG tablet Take 2 tablets (1,300 mg total) by mouth 2 (two) times daily. 10/31/21   Alfredo Martinez, MD  tamsulosin (FLOMAX) 0.4 MG CAPS capsule Take 0.8 mg by mouth every evening.    [provider]  Vitamin D, Ergocalciferol, (DRISDOL) 1.25 MG (50000 UNIT) CAPS capsule Take 50,000 Units by mouth every 30 (thirty) days. 17th of each month    [provider]  zinc sulfate 220 (50 Zn) MG capsule Take 220 mg by mouth daily.    [provider]    Physical Exam: Vitals:   07/17/22 2000 07/17/22 2030 07/17/22 2100 07/17/22 2135  BP: (!) 165/102 (!) 173/114 (!) 171/108 (!) 163/140  Pulse: 69 65 65 69  Resp:   20 19  Temp:    98 F (36.7 C)  TempSrc:    Oral  SpO2: 97%  99% 99% 96%    Physical Exam Vitals reviewed.  Constitutional:      General: He is not in acute distress. HENT:     Head: Normocephalic and atraumatic.  Eyes:     Extraocular Movements: Extraocular  movements intact.  Cardiovascular:     Rate and Rhythm: Normal rate and regular rhythm.     Pulses: Normal pulses.  Pulmonary:     Effort: Pulmonary effort is normal. No respiratory distress.     Breath sounds: No wheezing or rales.  Abdominal:     General: Bowel sounds are normal.     Palpations: Abdomen is soft.     Tenderness: There is no abdominal tenderness. There is no guarding.  Musculoskeletal:     Cervical back: Normal range of motion.     Right lower leg: Edema present.     Left lower leg: Edema present.     Comments: Left BKA  Skin:    General: Skin is warm and dry.  Neurological:     General: No focal deficit present.     Mental Status: He is alert and oriented to person, place, and time.     Labs on Admission: I have personally reviewed following labs and imaging studies  CBC: Recent Labs  Lab 07/17/22 1815  WBC 1.8*  NEUTROABS 0.9*  HGB 6.0*  HCT 19.9*  MCV 86.9  PLT 85*   Basic Metabolic Panel: Recent Labs  Lab 07/17/22 1815  NA 138  K 4.6  CL 108  CO2 19*  GLUCOSE 125*  BUN 68*  CREATININE 3.80*  CALCIUM 8.2*   GFR: CrCl cannot be calculated (Unknown ideal weight.). Liver Function Tests: Recent Labs  Lab 07/17/22 1815  AST 19  ALT 18  ALKPHOS 94  BILITOT 0.7  PROT 8.2*  ALBUMIN 3.1*   No results for input(s): "LIPASE", "AMYLASE" in the last 168 hours. No results for input(s): "AMMONIA" in the last 168 hours. Coagulation Profile: No results for input(s): "INR", "PROTIME" in the last 168 hours. Cardiac Enzymes: No results for input(s): "CKTOTAL", "CKMB", "CKMBINDEX", "TROPONINI" in the last 168 hours. BNP (last 3 results) No results for input(s): "PROBNP" in the last 8760 hours. HbA1C: No results for input(s): "HGBA1C" in the  last 72 hours. CBG: No results for input(s): "GLUCAP" in the last 168 hours. Lipid Profile: No results for input(s): "CHOL", "HDL", "LDLCALC", "TRIG", "CHOLHDL", "LDLDIRECT" in the last 72 hours. Thyroid Function Tests: No results for input(s): "TSH", "T4TOTAL", "FREET4", "T3FREE", "THYROIDAB" in the last 72 hours. Anemia Panel: No results for input(s): "VITAMINB12", "FOLATE", "FERRITIN", "TIBC", "IRON", "RETICCTPCT" in the last 72 hours. Urine analysis:    Component Value Date/Time   COLORURINE STRAW (A) 03/20/2022 0817   APPEARANCEUR CLEAR 03/20/2022 0817   APPEARANCEUR Clear 12/31/2013 2116   LABSPEC 1.009 03/20/2022 0817   LABSPEC 1.032 12/31/2013 2116   PHURINE 5.0 03/20/2022 0817   GLUCOSEU 50 (A) 03/20/2022 0817   GLUCOSEU >=500 12/31/2013 2116   HGBUR NEGATIVE 03/20/2022 0817   BILIRUBINUR NEGATIVE 03/20/2022 0817   BILIRUBINUR Negative 12/31/2013 2116   KETONESUR NEGATIVE 03/20/2022 0817   PROTEINUR >=300 (A) 03/20/2022 0817   UROBILINOGEN 1.0 08/28/2013 1709   NITRITE NEGATIVE 03/20/2022 0817   LEUKOCYTESUR NEGATIVE 03/20/2022 0817   LEUKOCYTESUR Negative 12/31/2013 2116    Radiological Exams on Admission: DG Chest Port 1 View  Result Date: 07/17/2022 CLINICAL DATA:  Shortness of breath. EXAM: PORTABLE CHEST 1 VIEW COMPARISON:  Chest radiograph dated 03/23/2022. FINDINGS: There is cardiomegaly with vascular congestion and edema. Pneumonia is not excluded. No pleural effusion pneumothorax the degenerative changes of the acute osseous pathology. IMPRESSION: Cardiomegaly with vascular congestion and edema. Electronically Signed   By: Elgie Collard M.D.   On: 07/17/2022 19:43  Assessment and Plan  Acute on chronic HFpEF Patient was placed on 3 L supplemental oxygen on arrival to ED due to increased work of breathing, unclear whether he was hypoxic.  Not on home oxygen.  Appears volume overloaded on exam and BNP evaded at 562. Chest x-ray showing cardiomegaly with  vascular congestion and edema.  Echo done in January 2024 showing EF 50 to 55%, grade 2 diastolic dysfunction, mild AVR, mild AVS. Lasix 40 mg twice daily PRN listed in his home meds but unclear whether he is receiving it at his nursing facility.  Patient was given IV Lasix 40 mg in the ED.  Repeat labs in the morning to check renal function and order additional doses of Lasix accordingly.  Monitor intake and output, daily weights.  Acute blood loss anemia secondary to suspected occult GI bleed Hemoglobin currently 6.0 and MCV 86.9.  Hemoglobin was previously 7.6 on labs done 07/09/2022 and baseline appears to be in the 7-8 range. Brown liquid stool noted on rectal exam done in the ED but FOBT positive.  C. difficile PCR pending, will also check GI pathogen panel.  Prior colonoscopy done in October 2022 was limited due to poor bowel prep.  [facility] consulted and keep n.p.o. after midnight.  No tachycardia or hypotension.  1 unit PRBCs ordered in the ED, follow-up posttransfusion H&H.  Continue to transfuse if hemoglobin less than 7.  Hold home aspirin.  Chest pain Stat EKG ordered and currently pending.  Troponin 25 > 11 and not consistent with ACS.   UDS ordered given prior history of cocaine abuse.  PE less likely given no tachycardia and chest pain is not pleuritic.  Pancytopenia WBC 1.8 (absolute neutrophil count and absolute lymphocyte count low).  WBC count was 2.0 on labs done 07/09/2022 and was slightly low on previous labs done in January/February 2024).  No fever or signs of infection.  Check procalcitonin level, blood cultures pending.  Also anemic and platelet count is low.  Platelet count currently 85k and was 117k on labs done 07/09/2022 but previously normal.  Continue to monitor CBC.  CKD stage IV Mild chronic metabolic acidosis Creatinine and bicarb stable.  Continue to monitor BMP.  Avoid nephrotoxic agents/contrast.  Insulin-dependent type 2 diabetes A1c 6.1 on 03/18/2022.  Pharmacy  med rec currently pending.  Sensitive sliding scale insulin ordered.  PVD: Hold aspirin given concern for GI bleed. History of CVA with residual right-sided weakness: Hold aspirin given concern for GI bleed. Hypertension: Blood pressure elevated. IV hydralazine PRN SBP >180.  Hyperlipidemia Bipolar disorder, depression BPH Pharmacy med rec pending.  DVT prophylaxis: SCDs Code Status: Discussed CODE STATUS with the patient and he wishes to be full code. Family Communication: No family available at this time. Level of care: Progressive Care Unit Admission status: It is my clinical opinion that referral for OBSERVATION is reasonable and necessary in this patient based on the above information provided. The aforementioned taken together are felt to place the patient at high risk for further clinical deterioration. However, it is anticipated that the patient may be medically stable for discharge from the hospital within 24 to 48 hours.   Dvon Giovanni MD [facility]  If 7PM-7AM, please contact night-coverage [email or web address]  07/17/2022, 9:42 PM

## 2022-07-17 NOTE — ED Triage Notes (Signed)
Pt BIB EMS from [facility]. Pt c/o SOB off and on x 2 weeks. Recently dx with Bronchitis yesterday, started on Azithromycin and Amoxicillin. Increased work of breathing and placed on 3 liters O2 via nasal cannula not at baseline.    BP 156/90 P 70 spO2 98% 3 liters RR 24

## 2022-07-17 NOTE — ED Provider Notes (Signed)
[facility] AT [facility] Provider Note   CSN: [id number] Arrival date & time: 07/17/22  1742     History  Chief Complaint  Patient presents with   Shortness of Breath    [name] is a 62 y.o. male, history of diabetes, CKD 4, CHF, who presents to the ED secondary to shortness of breath has been going on for the last 3 weeks.  He has been on Augmentin, azithromycin, for bronchitis without any relief.  He states he has been more short of breath, and now is requiring 3 L O2.  At baseline does not wear any oxygen.  Also endorses some chest pain, that is pressurized in the middle of his chest, and 7 out of 10.  He states it is squeezing as well. Home Medications Prior to Admission medications   Medication Sig Start Date End Date Taking? Authorizing Provider  acetaminophen (TYLENOL) 650 MG CR tablet Take 650 mg by mouth every 4 (four) hours as needed for pain.    [provider]  albuterol (VENTOLIN HFA) 108 (90 Base) MCG/ACT inhaler Inhale 2 puffs into the lungs every 6 (six) hours as needed for wheezing or shortness of breath.    [provider]  amLODipine (NORVASC) 10 MG tablet Take 1 tablet (10 mg total) by mouth daily. 12/11/21   [name], [name], DO  ascorbic acid (VITAMIN C) 500 MG tablet Take 500 mg by mouth 2 (two) times daily.    [provider]  aspirin 81 MG chewable tablet Chew 81 mg by mouth daily.    [provider]  atorvastatin (LIPITOR) 80 MG tablet Take 80 mg by mouth at bedtime.    [provider]  brimonidine (ALPHAGAN) 0.2 % ophthalmic solution Place 1 drop into both eyes 2 (two) times daily.    [provider]  carvedilol (COREG) 12.5 MG tablet Take 1 tablet (12.5 mg total) by mouth 2 (two) times daily with a meal. 04/02/22   [name], Loura Halt, MD  docusate sodium (COLACE) 100 MG capsule Take 100 mg by mouth at bedtime.    [provider]  dorzolamide (TRUSOPT) 2 %  ophthalmic solution Place 1 drop into both eyes 2 (two) times daily.    [provider]  ferrous sulfate 325 (65 FE) MG tablet Take 1 tablet (325 mg total) by mouth daily. 09/23/20   Marguerita Merles Latif, DO  furosemide (LASIX) 40 MG tablet Take 1 tablet (40 mg total) by mouth 2 (two) times daily as needed for edema or fluid. 04/02/22 04/02/23  Dimple Nanas, MD  furosemide (LASIX) 80 MG tablet Take 1 tablet (80 mg total) by mouth 2 (two) times daily. 10/31/21   Alfredo Martinez, MD  hydrALAZINE (APRESOLINE) 50 MG tablet Take 1 tablet (50 mg total) by mouth every 8 (eight) hours. 04/02/22   [name], [name], MD  insulin aspart (NOVOLOG FLEXPEN) 100 UNIT/ML FlexPen Inject 2-10 Units into the skin 3 (three) times daily with meals. Inject per sliding scale    [provider]  Insulin Glargine (BASAGLAR KWIKPEN) 100 UNIT/ML Inject 10 Units into the skin daily. 04/02/22   [name], [name], MD  latanoprost (XALATAN) 0.005 % ophthalmic solution Place 1 drop into both eyes at bedtime.    [provider]  Multiple Vitamin (MULTIVITAMIN) tablet Take 1 tablet by mouth daily.    [provider]  Nutritional Supplements (FEEDING SUPPLEMENT, NEPRO CARB STEADY,) LIQD Take 237 mLs by mouth at  bedtime. 10/31/21   Alfredo Martinez, MD  ondansetron (ZOFRAN) 4 MG tablet Take 4 mg by mouth every 4 (four) hours as needed for nausea or vomiting.    [provider]  pantoprazole (PROTONIX) 40 MG tablet Take 1 tablet (40 mg total) by mouth daily. 10/31/21   Alfredo Martinez, MD  pregabalin (LYRICA) 75 MG capsule Take 1 capsule (75 mg total) by mouth daily. 10/31/21   Alfredo Martinez, MD  PROTEIN PO Take 30 mLs by mouth 2 (two) times daily.    [provider]  saccharomyces boulardii (FLORASTOR) 250 MG capsule Take 1 capsule (250 mg total) by mouth 2 (two) times daily. 09/07/20   Lanae Boast, MD  sertraline (ZOLOFT) 50 MG tablet Take 50 mg by mouth every other day.    [provider]  sodium bicarbonate 650 MG tablet Take 2 tablets (1,300 mg total) by mouth 2 (two) times daily. 10/31/21   Alfredo Martinez, MD  tamsulosin (FLOMAX) 0.4 MG CAPS capsule Take 0.8 mg by mouth every evening.    [provider]  Vitamin D, Ergocalciferol, (DRISDOL) 1.25 MG (50000 UNIT) CAPS capsule Take 50,000 Units by mouth every 30 (thirty) days. 17th of each month    [provider]  zinc sulfate 220 (50 Zn) MG capsule Take 220 mg by mouth daily.    [provider]      Allergies    Other    Review of Systems   Review of Systems  Constitutional:  Negative for fever.  Respiratory:  Positive for shortness of breath.   Cardiovascular:  Positive for chest pain.    Physical Exam Updated Vital Signs BP (!) 163/140   Pulse 69   Temp 98 F (36.7 C) (Oral)   Resp 19   SpO2 96%  Physical Exam Vitals and nursing note reviewed.  Constitutional:      General: He is not in acute distress.    Appearance: He is well-developed.  HENT:     Head: Normocephalic and atraumatic.  Eyes:     Conjunctiva/sclera: Conjunctivae normal.  Cardiovascular:     Rate and Rhythm: Normal rate and regular rhythm.     Heart sounds: No murmur heard. Pulmonary:     Effort: Pulmonary effort is normal. No respiratory distress.     Comments: +coarse breath sounds throughout Abdominal:     Palpations: Abdomen is soft.     Tenderness: There is no abdominal tenderness.  Musculoskeletal:        General: No swelling.     Cervical back: Neck supple.     Right lower leg: 1+ Pitting Edema present.     Left lower leg: 1+ Pitting Edema present.     Comments: Left BKA   Skin:    General: Skin is warm and dry.     Capillary Refill: Capillary refill takes less than 2 seconds.  Neurological:     Mental Status: He is alert.  Psychiatric:        Mood and Affect: Mood normal.     ED Results / Procedures / Treatments   Labs (all labs ordered are listed, but only abnormal results are  displayed) Labs Reviewed  CBC WITH DIFFERENTIAL/PLATELET - Abnormal; Notable for the following components:      Result Value   WBC 1.8 (*)    RBC 2.29 (*)    Hemoglobin 6.0 (*)    HCT 19.9 (*)    RDW 20.8 (*)    Platelets 85 (*)  Neutro Abs 0.9 (*)    Lymphs Abs 0.5 (*)    Abs Immature Granulocytes 0.15 (*)    All other components within normal limits  COMPREHENSIVE METABOLIC PANEL - Abnormal; Notable for the following components:   CO2 19 (*)    Glucose, Bld 125 (*)    BUN 68 (*)    Creatinine, Ser 3.80 (*)    Calcium 8.2 (*)    Total Protein 8.2 (*)    Albumin 3.1 (*)    GFR, Estimated 17 (*)    All other components within normal limits  BRAIN NATRIURETIC PEPTIDE - Abnormal; Notable for the following components:   B Natriuretic Peptide 562.6 (*)    All other components within normal limits  LACTIC ACID, PLASMA - Abnormal; Notable for the following components:   Lactic Acid, Venous <0.3 (*)    All other components within normal limits  POC OCCULT BLOOD, ED - Abnormal; Notable for the following components:   Fecal Occult Bld POSITIVE (*)    All other components within normal limits  TROPONIN I (HIGH SENSITIVITY) - Abnormal; Notable for the following components:   Troponin I (High Sensitivity) 25 (*)    All other components within normal limits  CULTURE, BLOOD (ROUTINE X 2)  CULTURE, BLOOD (ROUTINE X 2)  C DIFFICILE QUICK SCREEN W PCR REFLEX    LACTIC ACID, PLASMA  CBC  BASIC METABOLIC PANEL  TYPE AND SCREEN  PREPARE RBC (CROSSMATCH)  TROPONIN I (HIGH SENSITIVITY)    EKG None  Radiology DG Chest Port 1 View  Result Date: 07/17/2022 CLINICAL DATA:  Shortness of breath. EXAM: PORTABLE CHEST 1 VIEW COMPARISON:  Chest radiograph dated 03/23/2022. FINDINGS: There is cardiomegaly with vascular congestion and edema. Pneumonia is not excluded. No pleural effusion pneumothorax the degenerative changes of the acute osseous pathology. IMPRESSION: Cardiomegaly with vascular  congestion and edema. Electronically Signed   By: Elgie Collard M.D.   On: 07/17/2022 19:43    Procedures Procedures    Medications Ordered in ED Medications  0.9 %  sodium chloride infusion (Manually program via Guardrails IV Fluids) (has no administration in time range)  acetaminophen (TYLENOL) tablet 650 mg (has no administration in time range)    Or  acetaminophen (TYLENOL) suppository 650 mg (has no administration in time range)  insulin aspart (novoLOG) injection 0-9 Units (has no administration in time range)  cefTRIAXone (ROCEPHIN) 2 g in sodium chloride 0.9 % 100 mL IVPB (0 g Intravenous Stopped 07/17/22 2011)  furosemide (LASIX) injection 40 mg (40 mg Intravenous Given 07/17/22 1940)    ED Course/ Medical Decision Making/ A&P                             Medical Decision Making Patient is a 62 year old male, here for shortness of breath, chest tightness, shortness of breath has been going on for the last 3 weeks, and now he is requiring 3 L of O2.  He states that he does sometimes use O2 at home, but cannot say how much.  Additionally states that he has having some chest tightness, is poor historian thus limited.  We obtained a chest x-ray, blood work, troponin, for further evaluation.  I am also suspicious for possible heart failure exacerbation given his coarse breath sounds, and pitting edema.  Ceftriaxone given new on set hypoxia, and coarse breath sounds in case of pneumonia  Amount and/or Complexity of Data Reviewed Labs: ordered.  Details: Labs show elevated BNP, troponin mildly elevated, Positive Hemoccult, hemoglobin is 6.0 Radiology: ordered.    Details: Chest x-ray shows cardiomegaly with vascular congestion and edema Discussion of management or test interpretation with external provider(s): Discussed with Dr. Suezanne Jacquet, patient with positive Hemoccult, anemia, thus believed to have a GI bleed causing anemia.  Given 1 unit of blood, discussed with patient and he  excepted risk associated with blood intake, given to symptomatic anemia.  He will likely need scope, GI consulted.  Additionally in CHF exacerbation, with coarse breath sounds, and evidence of edema.  Given 1 dose of furosemide, will need further trending.  I believe that his troponin is likely elevated secondary to the CHF exacerbation versus GI bleed.  He will need admission, discussed with Dr. Loney Loh, she accepts admission.  Risk Prescription drug management. Decision regarding hospitalization.    Final Clinical Impression(s) / ED Diagnoses Final diagnoses:  Anemia, unspecified type  Acute on chronic congestive heart failure, unspecified heart failure type (HCC)  Hypoxia  Gastrointestinal hemorrhage, unspecified gastrointestinal hemorrhage type    Rx / DC Orders ED Discharge Orders     None         Schyler Butikofer, Harley Alto, PA 07/17/22 2154    Lonell Grandchild, MD 07/18/22 1616

## 2022-07-18 DIAGNOSIS — E8722 Chronic metabolic acidosis: Secondary | ICD-10-CM | POA: Diagnosis present

## 2022-07-18 DIAGNOSIS — Z89512 Acquired absence of left leg below knee: Secondary | ICD-10-CM | POA: Diagnosis not present

## 2022-07-18 DIAGNOSIS — I130 Hypertensive heart and chronic kidney disease with heart failure and stage 1 through stage 4 chronic kidney disease, or unspecified chronic kidney disease: Secondary | ICD-10-CM | POA: Diagnosis present

## 2022-07-18 DIAGNOSIS — F319 Bipolar disorder, unspecified: Secondary | ICD-10-CM | POA: Diagnosis present

## 2022-07-18 DIAGNOSIS — Z87891 Personal history of nicotine dependence: Secondary | ICD-10-CM | POA: Diagnosis not present

## 2022-07-18 DIAGNOSIS — Z794 Long term (current) use of insulin: Secondary | ICD-10-CM | POA: Diagnosis not present

## 2022-07-18 DIAGNOSIS — Z8249 Family history of ischemic heart disease and other diseases of the circulatory system: Secondary | ICD-10-CM | POA: Diagnosis not present

## 2022-07-18 DIAGNOSIS — Z7982 Long term (current) use of aspirin: Secondary | ICD-10-CM | POA: Diagnosis not present

## 2022-07-18 DIAGNOSIS — N184 Chronic kidney disease, stage 4 (severe): Secondary | ICD-10-CM | POA: Diagnosis present

## 2022-07-18 DIAGNOSIS — Z833 Family history of diabetes mellitus: Secondary | ICD-10-CM | POA: Diagnosis not present

## 2022-07-18 DIAGNOSIS — Z6841 Body Mass Index (BMI) 40.0 and over, adult: Secondary | ICD-10-CM | POA: Diagnosis not present

## 2022-07-18 DIAGNOSIS — D62 Acute posthemorrhagic anemia: Secondary | ICD-10-CM | POA: Diagnosis present

## 2022-07-18 DIAGNOSIS — K922 Gastrointestinal hemorrhage, unspecified: Secondary | ICD-10-CM | POA: Diagnosis present

## 2022-07-18 DIAGNOSIS — R0902 Hypoxemia: Secondary | ICD-10-CM | POA: Diagnosis present

## 2022-07-18 DIAGNOSIS — E785 Hyperlipidemia, unspecified: Secondary | ICD-10-CM | POA: Diagnosis present

## 2022-07-18 DIAGNOSIS — Z823 Family history of stroke: Secondary | ICD-10-CM | POA: Diagnosis not present

## 2022-07-18 DIAGNOSIS — N400 Enlarged prostate without lower urinary tract symptoms: Secondary | ICD-10-CM | POA: Diagnosis present

## 2022-07-18 DIAGNOSIS — I69351 Hemiplegia and hemiparesis following cerebral infarction affecting right dominant side: Secondary | ICD-10-CM | POA: Diagnosis not present

## 2022-07-18 DIAGNOSIS — E1122 Type 2 diabetes mellitus with diabetic chronic kidney disease: Secondary | ICD-10-CM | POA: Diagnosis present

## 2022-07-18 DIAGNOSIS — D61818 Other pancytopenia: Secondary | ICD-10-CM | POA: Diagnosis present

## 2022-07-18 DIAGNOSIS — I5033 Acute on chronic diastolic (congestive) heart failure: Secondary | ICD-10-CM | POA: Diagnosis present

## 2022-07-18 DIAGNOSIS — E1151 Type 2 diabetes mellitus with diabetic peripheral angiopathy without gangrene: Secondary | ICD-10-CM | POA: Diagnosis present

## 2022-07-18 DIAGNOSIS — L97829 Non-pressure chronic ulcer of other part of left lower leg with unspecified severity: Secondary | ICD-10-CM | POA: Diagnosis present

## 2022-07-18 DIAGNOSIS — Z79899 Other long term (current) drug therapy: Secondary | ICD-10-CM | POA: Diagnosis not present

## 2022-07-18 LAB — BASIC METABOLIC PANEL
Anion gap: 10 (ref 5–15)
BUN: 63 mg/dL — ABNORMAL HIGH (ref 6–20)
CO2: 18 mmol/L — ABNORMAL LOW (ref 22–32)
Calcium: 8 mg/dL — ABNORMAL LOW (ref 8.9–10.3)
Chloride: 112 mmol/L — ABNORMAL HIGH (ref 98–111)
Creatinine, Ser: 3.85 mg/dL — ABNORMAL HIGH (ref 0.61–1.24)
GFR, Estimated: 17 mL/min — ABNORMAL LOW (ref >60)
Glucose, Bld: 88 mg/dL (ref 70–99)
Potassium: 4.4 mmol/L (ref 3.5–5.1)
Sodium: 140 mmol/L (ref 135–145)

## 2022-07-18 LAB — GLUCOSE, CAPILLARY
Glucose-Capillary: 108 mg/dL — ABNORMAL HIGH (ref 70–99)
Glucose-Capillary: 119 mg/dL — ABNORMAL HIGH (ref 70–99)
Glucose-Capillary: 133 mg/dL — ABNORMAL HIGH (ref 70–99)
Glucose-Capillary: 147 mg/dL — ABNORMAL HIGH (ref 70–99)
Glucose-Capillary: 73 mg/dL (ref 70–99)
Glucose-Capillary: 93 mg/dL (ref 70–99)

## 2022-07-18 LAB — TYPE AND SCREEN
ABO/RH(D): O POS
Unit division: 0
Unit division: 0

## 2022-07-18 LAB — HEMOGLOBIN AND HEMATOCRIT, BLOOD
HCT: 26.8 % — ABNORMAL LOW (ref 39.0–52.0)
Hemoglobin: 8.6 g/dL — ABNORMAL LOW (ref 13.0–17.0)

## 2022-07-18 LAB — BPAM RBC
Blood Product Expiration Date: 202406262359
ISSUE DATE / TIME: 202405222128
Unit Type and Rh: 5100

## 2022-07-18 LAB — RAPID URINE DRUG SCREEN, HOSP PERFORMED
Amphetamines: NOT DETECTED
Barbiturates: NOT DETECTED
Benzodiazepines: NOT DETECTED
Cocaine: NOT DETECTED
Opiates: NOT DETECTED
Tetrahydrocannabinol: NOT DETECTED

## 2022-07-18 LAB — CBC
HCT: 20.9 % — ABNORMAL LOW (ref 39.0–52.0)
Hemoglobin: 6.3 g/dL — CL (ref 13.0–17.0)
MCH: 26.9 pg (ref 26.0–34.0)
MCHC: 30.1 g/dL (ref 30.0–36.0)
MCV: 89.3 fL (ref 80.0–100.0)
Platelets: 77 10*3/uL — ABNORMAL LOW (ref 150–400)
RBC: 2.34 MIL/uL — ABNORMAL LOW (ref 4.22–5.81)
RDW: 19.6 % — ABNORMAL HIGH (ref 11.5–15.5)
WBC: 2 10*3/uL — ABNORMAL LOW (ref 4.0–10.5)
nRBC: 0 % (ref 0.0–0.2)

## 2022-07-18 LAB — PROCALCITONIN: Procalcitonin: 0.13 ng/mL

## 2022-07-18 LAB — MRSA NEXT GEN BY PCR, NASAL: MRSA by PCR Next Gen: DETECTED — AB

## 2022-07-18 LAB — PREPARE RBC (CROSSMATCH)

## 2022-07-18 MED ORDER — SODIUM BICARBONATE 650 MG PO TABS
1300.0000 mg | ORAL_TABLET | Freq: Two times a day (BID) | ORAL | Status: DC
  Administered 2022-07-18 – 2022-07-20 (×5): 1300 mg via ORAL
  Filled 2022-07-18 (×5): qty 2

## 2022-07-18 MED ORDER — CARVEDILOL 12.5 MG PO TABS
12.5000 mg | ORAL_TABLET | Freq: Two times a day (BID) | ORAL | Status: DC
  Administered 2022-07-18 – 2022-07-20 (×6): 12.5 mg via ORAL
  Filled 2022-07-18 (×6): qty 1

## 2022-07-18 MED ORDER — TAMSULOSIN HCL 0.4 MG PO CAPS
0.8000 mg | ORAL_CAPSULE | Freq: Every day | ORAL | Status: DC
  Administered 2022-07-18 – 2022-07-19 (×2): 0.8 mg via ORAL
  Filled 2022-07-18 (×2): qty 2

## 2022-07-18 MED ORDER — INSULIN ASPART 100 UNIT/ML IJ SOLN
0.0000 [IU] | Freq: Three times a day (TID) | INTRAMUSCULAR | Status: DC
  Administered 2022-07-18: 1 [IU] via SUBCUTANEOUS
  Administered 2022-07-20: 2 [IU] via SUBCUTANEOUS

## 2022-07-18 MED ORDER — HYDRALAZINE HCL 25 MG PO TABS
25.0000 mg | ORAL_TABLET | Freq: Four times a day (QID) | ORAL | Status: DC | PRN
  Administered 2022-07-18: 25 mg via ORAL
  Filled 2022-07-18: qty 1

## 2022-07-18 MED ORDER — DORZOLAMIDE HCL 2 % OP SOLN
1.0000 [drp] | Freq: Two times a day (BID) | OPHTHALMIC | Status: DC
  Administered 2022-07-18 – 2022-07-20 (×5): 1 [drp] via OPHTHALMIC
  Filled 2022-07-18: qty 10

## 2022-07-18 MED ORDER — PANTOPRAZOLE SODIUM 40 MG IV SOLR
40.0000 mg | Freq: Two times a day (BID) | INTRAVENOUS | Status: DC
  Administered 2022-07-18 – 2022-07-19 (×3): 40 mg via INTRAVENOUS
  Filled 2022-07-18 (×3): qty 10

## 2022-07-18 MED ORDER — PREGABALIN 75 MG PO CAPS
75.0000 mg | ORAL_CAPSULE | Freq: Every day | ORAL | Status: DC
  Administered 2022-07-18 – 2022-07-20 (×3): 75 mg via ORAL
  Filled 2022-07-18 (×3): qty 1

## 2022-07-18 MED ORDER — HYDRALAZINE HCL 50 MG PO TABS
50.0000 mg | ORAL_TABLET | Freq: Four times a day (QID) | ORAL | Status: DC | PRN
  Administered 2022-07-19: 50 mg via ORAL
  Filled 2022-07-18: qty 1

## 2022-07-18 MED ORDER — LATANOPROST 0.005 % OP SOLN
1.0000 [drp] | Freq: Every day | OPHTHALMIC | Status: DC
  Administered 2022-07-18 – 2022-07-19 (×2): 1 [drp] via OPHTHALMIC
  Filled 2022-07-18: qty 2.5

## 2022-07-18 MED ORDER — SODIUM CHLORIDE 0.9% IV SOLUTION: Freq: Once | INTRAVENOUS | Status: AC

## 2022-07-18 MED ORDER — ATORVASTATIN CALCIUM 40 MG PO TABS
80.0000 mg | ORAL_TABLET | Freq: Every day | ORAL | Status: DC
  Administered 2022-07-18 – 2022-07-19 (×2): 80 mg via ORAL
  Filled 2022-07-18 (×2): qty 2

## 2022-07-18 MED ORDER — HYDRALAZINE HCL 50 MG PO TABS
50.0000 mg | ORAL_TABLET | Freq: Once | ORAL | Status: AC
  Administered 2022-07-18: 50 mg via ORAL
  Filled 2022-07-18: qty 1

## 2022-07-18 MED ORDER — AMLODIPINE BESYLATE 10 MG PO TABS
10.0000 mg | ORAL_TABLET | Freq: Every day | ORAL | Status: DC
  Administered 2022-07-18 – 2022-07-20 (×3): 10 mg via ORAL
  Filled 2022-07-18 (×3): qty 1

## 2022-07-18 MED ORDER — FUROSEMIDE 10 MG/ML IJ SOLN
40.0000 mg | Freq: Every day | INTRAMUSCULAR | Status: DC
  Administered 2022-07-18 – 2022-07-20 (×3): 40 mg via INTRAVENOUS
  Filled 2022-07-18 (×3): qty 4

## 2022-07-18 MED ORDER — BRIMONIDINE TARTRATE 0.2 % OP SOLN
1.0000 [drp] | Freq: Two times a day (BID) | OPHTHALMIC | Status: DC
  Administered 2022-07-18 – 2022-07-20 (×5): 1 [drp] via OPHTHALMIC
  Filled 2022-07-18: qty 5

## 2022-07-18 MED ORDER — SERTRALINE HCL 50 MG PO TABS
50.0000 mg | ORAL_TABLET | ORAL | Status: DC
  Administered 2022-07-18 – 2022-07-20 (×2): 50 mg via ORAL
  Filled 2022-07-18 (×2): qty 1

## 2022-07-18 NOTE — [place] FL2 (Signed)
[place] MEDICAID [id number] LEVEL OF CARE FORM     IDENTIFICATION  Patient Name: [name] Birthdate: Apr 16, 1960 Sex: male Admission Date (Current Location): 07/17/2022  [facility] and [place] Number:  Producer, television/film/video and Address:  [facility],  [address] [place], [place] [address]      Provider Number: [id number]  Attending Physician Name and Address:  [name], Eric J, DO  Relative Name and Phone Number:  Jorja Loa [place](brother)[phone]    Current Level of Care: Hospital Recommended Level of Care: Skilled Nursing Facility Prior Approval Number:    Date Approved/Denied:   PASRR Number: [id number]  Discharge Plan: Other (Comment) (LTC)    Current Diagnoses: Patient Active Problem List   Diagnosis Date Noted   GI bleed 07/18/2022   Acute on chronic heart failure with preserved ejection fraction (HFpEF) (HCC) 07/17/2022   Acute blood loss anemia 07/17/2022   Acute GI bleeding 07/17/2022   DNR (do not resuscitate) 03/31/2022   Need for emotional support 03/30/2022   Counseling and coordination of care 03/30/2022   History of CVA (cerebrovascular accident) 03/30/2022   Palliative care encounter 03/30/2022   Acute on chronic diastolic CHF (congestive heart failure) (HCC) 03/17/2022   Acute respiratory failure with hypoxia (HCC) 03/17/2022   History of left below knee amputation (HCC) 02/25/2022   Swelling of arm 10/24/2021   Multiple thyroid nodules 10/19/2021   Chronic heart failure with preserved ejection fraction (HFpEF) (HCC) 10/19/2021   Diabetic foot infection (HCC) 03/01/2021   PAD (peripheral artery disease) (HCC) 03/01/2021   Glaucoma (increased eye pressure) 03/01/2021   Pressure injury of skin 11/19/2020   Depression with anxiety 11/18/2020   BPH (benign prostatic hyperplasia) 11/18/2020   CVA (HCC) with residual right-sided weakness 08/22/2020   TIA (transient ischemic attack) 07/03/2020   Chest pain 06/25/2020   Pulmonary artery hypertension  (HCC) 06/25/2020   CKD (chronic kidney disease), stage IV (HCC) 06/25/2020   History of cerebrovascular accident (CVA) with residual deficit 06/25/2020   Morbid (severe) obesity due to excess calories (HCC) 03/30/2020   Malnutrition of moderate degree 01/25/2020   Palliative care by specialist    Goals of care, counseling/discussion    Nephrotic syndrome 01/06/2020   Anasarca 01/06/2020   Generalized body aches 01/01/2020   Chronic diastolic CHF (congestive heart failure) (HCC) 01/01/2020   Iron deficiency anemia 01/01/2020   Uncontrolled type 2 diabetes mellitus with hyperglycemia (HCC)    Pure hypercholesterolemia    Nonobstructive atherosclerosis of coronary artery 09/28/2017   MDD (major depressive disorder), recurrent episode, severe (HCC) 07/30/2017   Osteoarthritis 04/12/2015   Substance-related disorder (HCC) 10/30/2014   Patient's noncompliance with other medical treatment and regimen 06/07/2014   Chronic pain 05/31/2014   History of noncompliance with medical treatment 05/31/2014   Malingering 05/31/2014   Pancytopenia (HCC) 05/31/2014   Diabetic neuropathy associated with diabetes mellitus due to underlying condition (HCC) 05/31/2014   Depression, major, recurrent, severe with psychosis (HCC) 08/29/2013   Type 2 diabetes mellitus (HCC) 06/30/2013   Essential hypertension 12/09/2011    Orientation RESPIRATION BLADDER Height & Weight     Self, Time, Situation, Place  Normal Incontinent Weight: 126.5 kg Height:  5\' 8"  (172.7 cm)  BEHAVIORAL SYMPTOMS/MOOD NEUROLOGICAL BOWEL NUTRITION STATUS      Incontinent Diet (Regular)  AMBULATORY STATUS COMMUNICATION OF NEEDS Skin   Extensive Assist Verbally Normal  Personal Care Assistance Level of Assistance  Bathing, Feeding, Dressing Bathing Assistance: Maximum assistance Feeding assistance: Maximum assistance Dressing Assistance: Maximum assistance     Functional Limitations Info  Sight,  Hearing, Speech Sight Info: Adequate Hearing Info: Adequate Speech Info: Adequate    SPECIAL CARE FACTORS FREQUENCY                       Contractures Contractures Info: Not present    Additional Factors Info  Code Status, Allergies Code Status Info: Full Allergies Info: Other, Corn-containing Products, Sertraline           Current Medications (07/18/2022):  This is the current hospital active medication list Current Facility-Administered Medications  Medication Dose Route Frequency Provider Last Rate Last Admin   0.9 %  sodium chloride infusion (Manually program via Guardrails IV Fluids)   Intravenous Once Dennise Giovanni, MD       acetaminophen (TYLENOL) tablet 650 mg  650 mg Oral Q6H PRN Darrill Giovanni, MD       Or   acetaminophen (TYLENOL) suppository 650 mg  650 mg Rectal Q6H PRN Braddock Giovanni, MD       amLODipine (NORVASC) tablet 10 mg  10 mg Oral Daily [name], [name], DO   10 mg at 07/18/22 1220   atorvastatin (LIPITOR) tablet 80 mg  80 mg Oral QHS [name], Eric J, DO       brimonidine (ALPHAGAN) 0.2 % ophthalmic solution 1 drop  1 drop Both Eyes BID [name], [name], DO   1 drop at 07/18/22 1343   carvedilol (COREG) tablet 12.5 mg  12.5 mg Oral BID WC [name], [name], DO   12.5 mg at 07/18/22 1220   dorzolamide (TRUSOPT) 2 % ophthalmic solution 1 drop  1 drop Both Eyes BID [name], [name], DO   1 drop at 07/18/22 0943   furosemide (LASIX) injection 40 mg  40 mg Intravenous Daily [name], Eric J, DO   40 mg at 07/18/22 0940   hydrALAZINE (APRESOLINE) tablet 25 mg  25 mg Oral Q6H PRN [name], Eric J, DO   25 mg at 07/18/22 1414   insulin aspart (novoLOG) injection 0-9 Units  0-9 Units Subcutaneous TID WC [name], Eric J, DO   1 Units at 07/18/22 1220   latanoprost (XALATAN) 0.005 % ophthalmic solution 1 drop  1 drop Both Eyes QHS [name], Eric J, DO       pantoprazole (PROTONIX) injection 40 mg  40 mg Intravenous Vira Browns, MD   40 mg at  07/18/22 1220   pregabalin (LYRICA) capsule 75 mg  75 mg Oral Daily [name], Eric J, DO   75 mg at 07/18/22 0941   sertraline (ZOLOFT) tablet 50 mg  50 mg Oral QODAY [name], [name], DO   50 mg at 07/18/22 0941   sodium bicarbonate tablet 1,300 mg  1,300 mg Oral BID [name], Eric J, DO   1,300 mg at 07/18/22 1240   tamsulosin (FLOMAX) capsule 0.8 mg  0.8 mg Oral QHS [name], Eric J, DO         Discharge Medications: Please see discharge summary for a list of discharge medications.  Relevant Imaging Results:  Relevant Lab Results:   Additional Information ss#[id number]  Bryann Gentz, Olegario Messier, RN

## 2022-07-18 NOTE — Consult Note (Signed)
Referring Provider: Dr. [name] Primary Care Physician:  [name], MD Primary Gastroenterologist:  Gentry Fitz  Reason for Consultation:  Anemia; Heme positive stool  HPI: [name] is a 62 y.o. male with multiple medical problems seen for a consult due to anemia (Hgb 6) and heme positive stool. Patient came in with 2 week history of shortness of breath. Patient does not look at his stool stating that someone cleans him so he does not know what his stool looks like. No report of melena, hematochezia, or hematemesis. History of colonic decompressions last in 2022. Hgb 8.1 in Feb.  Past Medical History:  Diagnosis Date   Acute on chronic diastolic CHF (congestive heart failure) (HCC) 08/22/2020   Acute on chronic heart failure with preserved ejection fraction (HCC)    Acute renal failure superimposed on stage 3a chronic kidney disease (HCC) 11/18/2020   Arthritis    Bacteremia due to Enterococcus 12/09/2021   Bipolar affective (HCC)    CHF (congestive heart failure) (HCC)    Claustrophobia    Severe   Cocaine abuse (HCC) 05/31/2014   Cocaine dependence, uncomplicated (HCC) 05/31/2014   Depression    Diabetes mellitus    uncontrolled    Elevated CK    Heart murmur    Hypertension    Ileus (HCC) 11/18/2020   Metabolic acidosis 10/23/2021   Non-traumatic rhabdomyolysis 01/01/2020   Pericardial effusion 08/22/2020   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   Suicidal ideations 08/28/2013    Past Surgical History:  Procedure Laterality Date   ABDOMINAL AORTOGRAM W/LOWER EXTREMITY Left 03/02/2021   Procedure: ABDOMINAL AORTOGRAM W/LOWER EXTREMITY;  Surgeon: Leonie Douglas, MD;  Location: [facility];  Service: Cardiovascular;  Laterality: Left;   AMPUTATION Left 03/07/2021   Procedure: LEFT BELOW KNEE AMPUTATION;  Surgeon: Leonie Douglas, MD;  Location: [facility] OR;  Service: Vascular;  Laterality: Left;   APPLICATION OF WOUND VAC Left  03/07/2021   Procedure: APPLICATION OF WOUND VAC;  Surgeon: Leonie Douglas, MD;  Location: [facility] OR;  Service: Vascular;  Laterality: Left;   BOWEL DECOMPRESSION N/A 01/19/2020   Procedure: BOWEL DECOMPRESSION;  Surgeon: Kathi Der, MD;  Location: [facility];  Service: Gastroenterology;  Laterality: N/A;   BOWEL DECOMPRESSION N/A 11/19/2020   Procedure: BOWEL DECOMPRESSION;  Surgeon: Charlott Rakes, MD;  Location: [facility];  Service: Endoscopy;  Laterality: N/A;   BOWEL DECOMPRESSION N/A 12/03/2020   Procedure: BOWEL DECOMPRESSION;  Surgeon: Kerin Salen, MD;  Location: [facility];  Service: Gastroenterology;  Laterality: N/A;   COLONOSCOPY WITH PROPOFOL N/A 01/19/2020   Procedure: COLONOSCOPY WITH PROPOFOL;  Surgeon: Kathi Der, MD;  Location: [facility];  Service: Gastroenterology;  Laterality: N/A;   COLONOSCOPY WITH PROPOFOL N/A 11/19/2020   Procedure: COLONOSCOPY WITH PROPOFOL;  Surgeon: Charlott Rakes, MD;  Location: [facility];  Service: Endoscopy;  Laterality: N/A;   COLONOSCOPY WITH PROPOFOL N/A 12/03/2020   Procedure: COLONOSCOPY WITH PROPOFOL;  Surgeon: Kerin Salen, MD;  Location: [facility];  Service: Gastroenterology;  Laterality: N/A;   KNEE SURGERY     b/l knees; pending total knee    RADIOLOGY WITH ANESTHESIA N/A 07/04/2020   Procedure: MRI WITH ANESTHESIA BRAIN WITHOUT CONTRAST AND HEAD WITHOUT CONTRAST;  Surgeon: Radiologist, Medication, MD;  Location: [facility] OR;  Service: Radiology;  Laterality: N/A;   TRANSMETATARSAL AMPUTATION Left 03/01/2021   Procedure: TRANSMETATARSAL AMPUTATION;  Surgeon: Vivi Barrack, DPM;  Location: [facility] OR;  Service: Podiatry;  Laterality: Left;  Prior to Admission medications   Medication Sig Start Date End Date Taking? Authorizing Provider  acetaminophen (TYLENOL) 650 MG CR tablet Take 650 mg by mouth every 4 (four) hours as needed (for discomfort- cannot exceed 3,000 mg from all combined sources/24 hours).   Yes  [provider]  albuterol (VENTOLIN HFA) 108 (90 Base) MCG/ACT inhaler Inhale 2 puffs into the lungs every 6 (six) hours as needed for wheezing or shortness of breath.   Yes [provider]  amLODipine (NORVASC) 10 MG tablet Take 1 tablet (10 mg total) by mouth daily. Patient taking differently: Take 10 mg by mouth See admin instructions. Take 10 mg by mouth once a day and hold if Systolic is <100 or Diastolic <16. Call NP is B/P is over 160/90. 12/11/21  Yes [name], [name], DO  ascorbic acid (VITAMIN C) 500 MG tablet Take 500 mg by mouth 2 (two) times daily.   Yes [provider]  aspirin 81 MG chewable tablet Chew 81 mg by mouth daily.   Yes [provider]  atorvastatin (LIPITOR) 80 MG tablet Take 80 mg by mouth at bedtime.   Yes [provider]  brimonidine (ALPHAGAN) 0.2 % ophthalmic solution Place 1 drop into both eyes 2 (two) times daily.   Yes [provider]  carvedilol (COREG) 12.5 MG tablet Take 1 tablet (12.5 mg total) by mouth 2 (two) times daily with a meal. 04/02/22  Yes [name], [name], MD  docusate sodium (COLACE) 100 MG capsule Take 100 mg by mouth at bedtime.   Yes [provider]  dorzolamide (TRUSOPT) 2 % ophthalmic solution Place 1 drop into both eyes 2 (two) times daily.   Yes [provider]  ferrous sulfate 325 (65 FE) MG tablet Take 1 tablet (325 mg total) by mouth daily. 09/23/20  Yes [name], [name], DO  hydrALAZINE (APRESOLINE) 50 MG tablet Take 1 tablet (50 mg total) by mouth every 8 (eight) hours. Patient taking differently: Take 50 mg by mouth See admin instructions. Take 50 mg by mouth at 4 AM, 10 AM, 4 PM, and 10 PM- hold for a Systolic reading less than 100 04/02/22  Yes [name], [name], MD  insulin aspart (NOVOLOG FLEXPEN) 100 UNIT/ML FlexPen Inject 2-10 Units into the skin See admin instructions. Inject 2-10 units into the skin three times a day before meals, per sliding scale: BGL  131-180 = 2 units; 181-240 = 4 units; 241-300 = 6 units; 301-350 = 8 units; 351-400 = 10 units; >400 = CALL MD   Yes [provider]  LANTUS SOLOSTAR 100 UNIT/ML Solostar Pen Inject 10 Units into the skin in the morning.   Yes [provider]  latanoprost (XALATAN) 0.005 % ophthalmic solution Place 1 drop into both eyes at bedtime.   Yes [provider]  Multiple Vitamin (MULTIVITAMIN) tablet Take 1 tablet by mouth See admin instructions. Take 1 tablet by mouth at 9 AM and 10 AM   Yes [provider]  ondansetron (ZOFRAN) 4 MG tablet Take 4 mg by mouth every 4 (four) hours as needed for nausea or vomiting.   Yes [provider]  OXYGEN Inhale 2 L/min into the lungs as needed (for sats below 90%).   Yes [provider]  pantoprazole (PROTONIX) 40 MG tablet Take 1 tablet (40 mg total) by mouth daily. Patient taking differently: Take 40 mg by mouth daily before breakfast. 10/31/21  Yes Alfredo Martinez, MD  potassium chloride SA (KLOR-CON M)  20 MEQ tablet Take 20 mEq by mouth in the morning.   Yes [provider]  pregabalin (LYRICA) 75 MG capsule Take 1 capsule (75 mg total) by mouth daily. 10/31/21  Yes Maxwell, Allee, MD  PROTEIN PO Take 30 mLs by mouth 2 (two) times daily.   Yes [provider]  saccharomyces boulardii (FLORASTOR) 250 MG capsule Take 1 capsule (250 mg total) by mouth 2 (two) times daily. 09/07/20  Yes Lanae Boast, MD  sertraline (ZOLOFT) 50 MG tablet Take 50 mg by mouth every other day.   Yes [provider]  sodium bicarbonate 650 MG tablet Take 2 tablets (1,300 mg total) by mouth 2 (two) times daily. Patient taking differently: Take 1,950 mg by mouth 2 (two) times daily. 10/31/21  Yes Alfredo Martinez, MD  tamsulosin (FLOMAX) 0.4 MG CAPS capsule Take 0.8 mg by mouth at bedtime.   Yes [provider]  torsemide (DEMADEX) 20 MG tablet Take 40-80 mg by mouth See admin instructions. Take 80 mg by mouth at 8  AM and 40 mg at 7 PM   Yes [provider]  Vitamin D, Ergocalciferol, (DRISDOL) 1.25 MG (50000 UNIT) CAPS capsule Take 50,000 Units by mouth every 30 (thirty) days. 17th of each month   Yes [provider]  zinc sulfate 220 (50 Zn) MG capsule Take 220 mg by mouth daily.   Yes [provider]  furosemide (LASIX) 40 MG tablet Take 1 tablet (40 mg total) by mouth 2 (two) times daily as needed for edema or fluid. Patient not taking: Reported on 07/17/2022 04/02/22 04/02/23  Dimple Nanas, MD  furosemide (LASIX) 80 MG tablet Take 1 tablet (80 mg total) by mouth 2 (two) times daily. Patient not taking: Reported on 07/17/2022 10/31/21   Alfredo Martinez, MD  Nutritional Supplements (FEEDING SUPPLEMENT, NEPRO CARB STEADY,) LIQD Take 237 mLs by mouth at bedtime. Patient not taking: Reported on 07/17/2022 10/31/21   Alfredo Martinez, MD    Scheduled Meds:  sodium chloride   Intravenous Once   sodium chloride   Intravenous Once   atorvastatin  80 mg Oral QHS   dorzolamide  1 drop Both Eyes BID   furosemide  40 mg Intravenous Daily   insulin aspart  0-9 Units Subcutaneous Q4H   latanoprost  1 drop Both Eyes QHS   pregabalin  75 mg Oral Daily   sertraline  50 mg Oral QODAY   tamsulosin  0.8 mg Oral QHS   Continuous Infusions: PRN Meds:.acetaminophen **OR** acetaminophen  Allergies as of 07/17/2022 - Review Complete 07/17/2022  Allergen Reaction Noted   Other Anaphylaxis, Itching, Swelling, and Other (See Comments) 08/28/2013   Corn-containing products Itching and Swelling 12/15/2014   Sertraline Other (See Comments) 11/26/2021    Family History  Problem Relation Age of Onset   Diabetes Other    Cancer Mother        uterus; deceased    Heart attack Mother 52   Hypertension Other    Heart attack Father 26   Stroke Sister        12/2012 age 61 y.o    Social History   Socioeconomic History   Marital status: Single    Spouse name: Not on file   Number of children:  Not on file   Years of education: Not on file   Highest education level: Not on file  Occupational History   Not on file  Tobacco Use   Smoking status: Former   Smokeless tobacco:  Never  Vaping Use   Vaping Use: Never used  Substance and Sexual Activity   Alcohol use: No   Drug use: Not Currently    Types: Cocaine    Comment: Last use 2019   Sexual activity: Yes    Birth control/protection: Condom  Other Topics Concern   Not on file  Social History Narrative   Moved from [place]   Denies smoking, drinking   +cocaine history (denies recent use)   Originally from [place]    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[employer]   No kids, married                      Social Determinants of Corporate investment banker Strain: Not on file  Food Insecurity: No Food Insecurity (07/18/2022)   Hunger Vital Sign    Worried About Running Out of Food in the Last Year: Never true    Ran Out of Food in the Last Year: Never true  Transportation Needs: No Transportation Needs (07/18/2022)   PRAPARE - Administrator, Civil Service (Medical): No    Lack of Transportation (Non-Medical): No  Physical Activity: Not on file  Stress: Not on file  Social Connections: Not on file  Intimate Partner Violence: Not At Risk (07/18/2022)   Humiliation, Afraid, Rape, and Kick questionnaire    Fear of Current or Ex-Partner: No    Emotionally Abused: No    Physically Abused: No    Sexually Abused: No    Review of Systems: All negative except as stated above in HPI.  Physical Exam: Vital signs: Vitals:   07/18/22 0200 07/18/22 0647  BP: (!) 156/85 (!) 155/88  Pulse: 65 68  Resp: 20 16  Temp: 97.9 F (36.6 C) 98.8 F (37.1 C)  SpO2: 100% 100%   Last BM Date : 07/17/22 General:  chronically ill-appearing, lethargic, no acute distress Head: normocephalic, atraumatic Eyes: anicteric sclera ENT: oropharynx clear Neck: supple, nontender Lungs:   Clear throughout to auscultation.   No wheezes, crackles, or rhonchi. No acute distress. Heart:  Regular rate and rhythm; no murmurs, clicks, rubs,  or gallops. Abdomen: soft, nontender, nondistended, +BS  Rectal:  Deferred Ext: chronic vascular changes on right extremity, left BKA  GI:  Lab Results: Recent Labs    07/17/22 1815 07/18/22 0532  WBC 1.8* 2.0*  HGB 6.0* 6.3*  HCT 19.9* 20.9*  PLT 85* 77*   BMET Recent Labs    07/17/22 1815 07/18/22 0532  NA 138 140  K 4.6 4.4  CL 108 112*  CO2 19* 18*  GLUCOSE 125* 88  BUN 68* 63*  CREATININE 3.80* 3.85*  CALCIUM 8.2* 8.0*   LFT Recent Labs    07/17/22 1815  PROT 8.2*  ALBUMIN 3.1*  AST 19  ALT 18  ALKPHOS 94  BILITOT 0.7   PT/INR No results for input(s): "LABPROT", "INR" in the last 72 hours.   Studies/Results: DG Chest Port 1 View  Result Date: 07/17/2022 CLINICAL DATA:  Shortness of breath. EXAM: PORTABLE CHEST 1 VIEW COMPARISON:  Chest radiograph dated 03/23/2022. FINDINGS: There is cardiomegaly with vascular congestion and edema. Pneumonia is not excluded. No pleural effusion pneumothorax the degenerative changes of the acute osseous pathology. IMPRESSION: Cardiomegaly with vascular congestion and edema. Electronically Signed   By: Elgie Collard M.D.   On: 07/17/2022 19:43    Impression/Plan: Symptomatic anemia with heme positive stool without overt bleeding.  Will hold off on endoscopic procedures at this time. Patient is hemodynamically stable and no overt GI bleeding seen. Start IV PPI Q 12 hours. Supportive care. Will follow.    LOS: 0 days   Shirley Friar  07/18/2022, 9:45 AM  Questions please call [phone]

## 2022-07-18 NOTE — Progress Notes (Signed)
Assumed care of patient from previous RN. Agree with previous RN assessment.

## 2022-07-18 NOTE — Progress Notes (Signed)
MD notified of elevated BP. No new orders at this time. Will recheck and medicate as needed.   Pt reports fatigue and weakness. He remains axox4 and will arouse by voice. MD notified. Vital signs stable. Second unit of blood complete, post H&H placed.   Will continue to monitor.

## 2022-07-18 NOTE — Progress Notes (Signed)
PROGRESS NOTE    [name]  ZOX:[id number] DOB: 09-09-1960 DOA: 07/17/2022 PCP: [name], MD    Brief Narrative:   [name] is a 62 y.o. male with past medical history significant for chronic diastolic congestive heart failure, CKD stage IV, type 2 diabetes mellitus, peripheral vascular disease s/p left BKA, history of CVA with residual right-sided weakness, HTN, HLD, bipolar disorder, depression, BPH, history of cocaine abuse, morbid obesity who presented to [facility] ED via EMS from [facility], [place] with complaints of shortness of breath, chest pain ongoing for the last 2-3 weeks.  Recently diagnosed with bronchitis and started on azithromycin and Augmentin.  Patient reports medications he received at facility has not helped with his symptoms.  Patient unsure what medication he receives although.  He is also concerned that he has "too much fluid in his body".  States normally does not use oxygen continuously but over the last few days they have been giving him supplemental oxygen at the facility.  Denies hematemesis, no hematochezia, no melena.  No other complaints.  In the ED, temperature 97.9 F, HR 69, RR 18, BP 153/82, SpO2 99% on 2 L nasal cannula.  WBC 1.8 (2.0 on 07/09/2022), hemoglobin 6.0 (7.6 on 07/09/2022 w/ baseline around 7-8), platelets 85 (117 on 07/09/2022).  Sodium 138, potassium 4.6, chloride 108, CO2 19, glucose 125, BUN 68, creatinine 3.80.  AST 19, ALT 18, total bilirubin 0.7.  High sensitive troponin 25 followed by 11.  BNP 562.6.  Lactic acid 0.6.  Procalcitonin 0.13.  FOBT positive.  Chest x-ray with cardiomegaly, vascular congestion and edema.  Patient received IV Lasix x 1, started on ceftriaxone, and given 1 L NS bolus.  1 unit PRBC ordered.  [facility], Dr. Marca Ancona consulted.  TRH consulted for admission for further evaluation management for concern for acute diastolic CHF exacerbation, symptomatic anemia concerning for GI  bleed.  Assessment & Plan:   Acute on chronic diastolic congestive heart failure On arrival to the ED, patient was placed on 3 L supplemental oxygen due to increased work of breathing, unclear whether he was hypoxic but not on continuous home O2.  BNP notably elevated at 562 with chest x-ray showing cardiomegaly with vascular congestion and edema.  Recent TTE January 2024 with LVEF 50-55%, grade 2 diastolic dysfunction, mild AVR, mild AAS.  Since appears to be on Lasix as needed but unclear if receiving at his facility. -- Continue Lasix 40 mg IV every 24 hours -- Strict I's and O's and daily weights -- Continue to monitor renal function  Acute blood loss anemia secondary to suspected GI bleed Hemoglobin 6.0 on admission, previously 7.6 on 07/09/2022.  Baseline hemoglobin 7-8.  FOBT positive.  Prior colonoscopy October 2022 limited due to poor bowel prep.  Patient hemodynamically stable. -- [facility] following; appreciate assistance -- Hgb 6.0>6.3 -- s/p 1u pRBC 5/22 -- Will order 2 additional units of PRBC today -- H&H following transfusion -- Protonix 40 mg IV every 12 hours -- CBC daily -- Clear liquid diet -- GI with no current plans for endoscopy due to staffing issues with anesthesia per discussion today  Chest pain, atypical noncardiac Patient complaining of chest discomfort over the last 2-3 weeks.  High sensitive troponins not consistent with ACS, nor likely PE given no tachycardia and chest pain not pleuritic.  EKG with normal sinus rhythm, rate 68, no concerning ST elevation/depressions or T wave inversions.  History of prior cocaine abuse, UDS negative  on admission.  Etiology likely related to acute blood loss anemia versus CHF exacerbation as above. -- Supportive care, monitor on telemetry  Pancytopenia WBC 1.8 (absolute neutrophil count and absolute lymphocyte count low).  WBC count was 2.0 on labs done 07/09/2022 and was slightly low on previous labs done in January/February  2024).  No fever or signs of infection.  Procalcitonin at 0.13.  Also anemic and platelet count is low.  Platelet count currently 85k and was 117k on labs done 07/09/2022 but previously normal.   -- CBC daily  CKD stage IV Chronic metabolic acidosis Baseline creatinine 3.9-4.1, stable. -- Cr 3.80>3.85 -- Sodium bicarbonate 1300 mg p.o. twice daily -- Monitor BMP daily  Type 2 diabetes mellitus Home medication includes Lantus 10 units subcutaneously every morning. --Sensitive SSI for coverage -- CBGs qAC/HS  Peripheral vascular disease s/p left BKA --Holding aspirin for concern of GI bleed -- Continue statin  History of CVA with residual right-sided weakness -- Hold aspirin given concern for GI bleed -- Continue statin  Essential hypertension -- Amlodipine 10 mg p.o. daily -- Carvedilol 12.5 mg p.o. twice daily -- Furosemide 40 mg IV daily -- Hold home hydralazine for now  Hyperlipidemia -- Atorvastatin 80 mg p.o. nightly  Bipolar disorder/depression -- Zoloft 50 mg p.o. every other day  BPH -- Tamsulosin 0.8 mg p.o. qHS   DVT prophylaxis: SCDs Start: 07/17/22 2149    Code Status: Full Code Family Communication: No family present at bedside this morning  Disposition Plan:  Level of care: Telemetry Status is: Inpatient Remains inpatient appropriate because: Blood transfusion, IV diuresis    Consultants:  [facility]  Procedures:  None  Antimicrobials:  Ceftriaxone x 1 dose on 5/22   Subjective: Patient seen examined bedside, lying in bed.  Continues with mild shortness of breath.  Something to eat, states he is hungry that is his biggest complaint.  Hemoglobin up to 6.3 this morning, discussed will need further transfusions today.  Remains on IV diuresis.  Titrated off of oxygen this morning.  Seen by GI, discussed with Dr. Bosie Clos this morning; no anticipated procedures at this time due to lack of anesthesia staffing.  Remains hemodynamically  stable.  Will start clear liquid diet.  No other questions or concerns at this time.  Denies headache, no dizziness, no current chest pain, no palpitations, no nausea/vomiting/diarrhea, no fever/chills/night sweats, no cough/congestion, no paresthesias.  No acute events overnight per nurse staff.  Objective: Vitals:   07/18/22 0647 07/18/22 0946 07/18/22 1012 07/18/22 1058  BP: (!) 155/88 (!) 178/96 (!) 160/82 (!) 167/85  Pulse: 68 80 90 72  Resp: 16 20 20 16   Temp: 98.8 F (37.1 C) 97.6 F (36.4 C) 98.1 F (36.7 C) 97.8 F (36.6 C)  TempSrc: Oral Oral  Oral  SpO2: 100% 98% 90% 95%  Weight: 126.5 kg     Height:        Intake/Output Summary (Last 24 hours) at 07/18/2022 1107 Last data filed at 07/18/2022 1045 Gross per 24 hour  Intake 565 ml  Output 750 ml  Net -185 ml   Filed Weights   07/17/22 2252 07/18/22 0647  Weight: 126.5 kg 126.5 kg    Examination:  Physical Exam: GEN: NAD, alert and oriented x 3, chronically ill in appearance, appears older than stated age HEENT: NCAT, PERRL, EOMI, sclera clear, poor dentition PULM: Breath sounds slight diminished bilateral bases with crackles, no wheezing, normal respiratory effort without accessory muscle use, on room air at  rest CV: RRR w/o M/G/R GI: abd soft, NTND, NABS, no R/G/M MSK: Trace-1+ pitting edema right lower extremity, noted left BKA with sock in place, moves all extremities independently with noted chronic weakness right side     Data Reviewed: I have personally reviewed following labs and imaging studies  CBC: Recent Labs  Lab 07/17/22 1815 07/18/22 0532  WBC 1.8* 2.0*  NEUTROABS 0.9*  --   HGB 6.0* 6.3*  HCT 19.9* 20.9*  MCV 86.9 89.3  PLT 85* 77*   Basic Metabolic Panel: Recent Labs  Lab 07/17/22 1815 07/18/22 0532  NA 138 140  K 4.6 4.4  CL 108 112*  CO2 19* 18*  GLUCOSE 125* 88  BUN 68* 63*  CREATININE 3.80* 3.85*  CALCIUM 8.2* 8.0*   GFR: Estimated Creatinine Clearance: 26.4 mL/min  (A) (by C-G formula based on SCr of 3.85 mg/dL (H)). Liver Function Tests: Recent Labs  Lab 07/17/22 1815  AST 19  ALT 18  ALKPHOS 94  BILITOT 0.7  PROT 8.2*  ALBUMIN 3.1*   No results for input(s): "LIPASE", "AMYLASE" in the last 168 hours. No results for input(s): "AMMONIA" in the last 168 hours. Coagulation Profile: No results for input(s): "INR", "PROTIME" in the last 168 hours. Cardiac Enzymes: No results for input(s): "CKTOTAL", "CKMB", "CKMBINDEX", "TROPONINI" in the last 168 hours. BNP (last 3 results) No results for input(s): "PROBNP" in the last 8760 hours. HbA1C: No results for input(s): "HGBA1C" in the last 72 hours. CBG: Recent Labs  Lab 07/17/22 2206 07/18/22 0032 07/18/22 0401 07/18/22 0759  GLUCAP 123* 108* 93 73   Lipid Profile: No results for input(s): "CHOL", "HDL", "LDLCALC", "TRIG", "CHOLHDL", "LDLDIRECT" in the last 72 hours. Thyroid Function Tests: No results for input(s): "TSH", "T4TOTAL", "FREET4", "T3FREE", "THYROIDAB" in the last 72 hours. Anemia Panel: No results for input(s): "VITAMINB12", "FOLATE", "FERRITIN", "TIBC", "IRON", "RETICCTPCT" in the last 72 hours. Sepsis Labs: Recent Labs  Lab 07/17/22 1815 07/17/22 2029 07/18/22 0532  PROCALCITON  --   --  0.13  LATICACIDVEN 0.6 <0.3*  --     Recent Results (from the past 240 hour(s))  Blood culture (routine x 2)     Status: None (Preliminary result)   Collection Time: 07/17/22  6:15 PM   Specimen: BLOOD  Result Value Ref Range Status   Specimen Description   Final    BLOOD LEFT ANTECUBITAL Performed at [facility], [address], [place], [place] [address]    Special Requests   Final    BOTTLES DRAWN AEROBIC AND ANAEROBIC Blood Culture results may not be optimal due to an excessive volume of blood received in culture bottles Performed at [facility], [address], [place], [place] [address]    Culture   Final    NO GROWTH < 12  HOURS Performed at [facility], [address] [place], [place] [address]    Report Status PENDING  Incomplete  Blood culture (routine x 2)     Status: None (Preliminary result)   Collection Time: 07/17/22  7:12 PM   Specimen: BLOOD  Result Value Ref Range Status   Specimen Description   Final    BLOOD SITE NOT SPECIFIED Performed at [facility], [address], [place], [place] [address]    Special Requests   Final    BOTTLES DRAWN AEROBIC AND ANAEROBIC Blood Culture results may not be optimal due to an inadequate volume of blood received in culture bottles  Performed at [facility], [address] [place], [place] [address]    Culture   Final    NO GROWTH < 12 HOURS Performed at [facility], [address] [place], [place] [address]    Report Status PENDING  Incomplete  MRSA Next Gen by PCR, Nasal     Status: Abnormal   Collection Time: 07/18/22  7:01 AM   Specimen: Nasal Mucosa; Nasal Swab  Result Value Ref Range Status   MRSA by PCR Next Gen DETECTED (A) NOT DETECTED Final    Comment: (NOTE) The GeneXpert MRSA Assay (FDA approved for NASAL specimens only), is one component of a comprehensive MRSA colonization surveillance program. It is not intended to diagnose MRSA infection nor to guide or monitor treatment for MRSA infections. Test performance is not FDA approved in patients less than 20 years old. Performed at [facility], [address], [place], [place] [address]          Radiology Studies: [facility] Chest Port 1 View  Result Date: 07/17/2022 CLINICAL DATA:  Shortness of breath. EXAM: PORTABLE CHEST 1 VIEW COMPARISON:  Chest radiograph dated 03/23/2022. FINDINGS: There is cardiomegaly with vascular congestion and edema. Pneumonia is not excluded. No pleural effusion pneumothorax the degenerative changes of the acute osseous pathology. IMPRESSION: Cardiomegaly with vascular congestion and edema.  Electronically Signed   By: Elgie Collard M.D.   On: 07/17/2022 19:43        Scheduled Meds:  sodium chloride   Intravenous Once   amLODipine  10 mg Oral Daily   atorvastatin  80 mg Oral QHS   brimonidine  1 drop Both Eyes BID   carvedilol  12.5 mg Oral BID WC   dorzolamide  1 drop Both Eyes BID   furosemide  40 mg Intravenous Daily   insulin aspart  0-9 Units Subcutaneous Q4H   latanoprost  1 drop Both Eyes QHS   pregabalin  75 mg Oral Daily   sertraline  50 mg Oral QODAY   sodium bicarbonate  1,300 mg Oral BID   tamsulosin  0.8 mg Oral QHS   Continuous Infusions:   LOS: 0 days    Time spent: 52 minutes spent on chart review, discussion with nursing staff, consultants, updating family and interview/physical exam; more than 50% of that time was spent in counseling and/or coordination of care.    [name] [name], DO [facility] Available via Epic secure chat 7am-7pm After these hours, please refer to coverage provider listed on [email or web address] 07/18/2022, 11:07 AM

## 2022-07-18 NOTE — TOC Initial Note (Signed)
Transition of Care [facility]) - Initial/Assessment Note    Patient Details  Name: [name] MRN: [id number] Date of Birth: [birth year]  [facility] CM/SW Contact:    Lanier Clam, RN Phone Number: 07/18/2022, 3:53 PM  Clinical Narrative: From [place] [facility] await d/c to return.                  Expected Discharge Plan: Long Term Nursing Home Barriers to Discharge: Continued Medical Work up   Patient Goals and CMS Choice Patient states their goals for this hospitalization and ongoing recovery are:: LTC CMS Medicare.gov Compare Post Acute Care list provided to:: Patient Represenative (must comment) Choice offered to / list presented to : Sibling      Expected Discharge Plan and Services   Discharge Planning Services: CM Consult                                          Prior Living Arrangements/Services       Do you feel safe going back to the place where you live?: Yes               Activities of Daily Living Home Assistive Devices/Equipment: Wheelchair, Nurse, adult, CBG Meter ADL Screening (condition at time of admission) Patient's cognitive ability adequate to safely complete daily activities?: No Is the patient deaf or have difficulty hearing?: No Does the patient have difficulty seeing, even when wearing glasses/contacts?: Yes Does the patient have difficulty concentrating, remembering, or making decisions?: No Patient able to express need for assistance with ADLs?: Yes Does the patient have difficulty dressing or bathing?: Yes Independently performs ADLs?: No Communication: Independent Dressing (OT): Needs assistance Is this a change from baseline?: Pre-admission baseline Grooming: Needs assistance Is this a change from baseline?: Pre-admission baseline Feeding: Independent Bathing: Needs assistance Is this a change from baseline?: Pre-admission baseline Toileting: Needs assistance Is this a change from baseline?: Pre-admission  baseline In/Out Bed: Needs assistance Is this a change from baseline?: Pre-admission baseline Walks in Home: Independent with device (comment) (wheelchair) Does the patient have difficulty walking or climbing stairs?: Yes Weakness of Legs: Both Weakness of Arms/Hands: Right  Permission Sought/Granted Permission sought to share information with : Case Manager Permission granted to share information with : Yes, Verbal Permission Granted  Share Information with NAME: Case manager           Emotional Assessment Appearance:: Appears stated age Attitude/Demeanor/Rapport: Gracious Affect (typically observed): Accepting Orientation: : Oriented to Self, Oriented to  Time, Oriented to Situation, Oriented to Place Alcohol / Substance Use: Not Applicable Psych Involvement: No (comment)  Admission diagnosis:  Hypoxia [R09.02] Gastrointestinal hemorrhage, unspecified gastrointestinal hemorrhage type [K92.2] Anemia, unspecified type [D64.9] Acute on chronic congestive heart failure, unspecified heart failure type (HCC) [I50.9] Acute on chronic heart failure with preserved ejection fraction (HFpEF) (HCC) [I50.33] GI bleed [K92.2] Patient Active Problem List   Diagnosis Date Noted   GI bleed 07/18/2022   Acute on chronic heart failure with preserved ejection fraction (HFpEF) (HCC) 07/17/2022   Acute blood loss anemia 07/17/2022   Acute GI bleeding 07/17/2022   DNR (do not resuscitate) 03/31/2022   Need for emotional support 03/30/2022   Counseling and coordination of care 03/30/2022   History of CVA (cerebrovascular accident) 03/30/2022   Palliative care encounter 03/30/2022   Acute on chronic diastolic CHF (congestive heart failure) (HCC) 03/17/2022  Acute respiratory failure with hypoxia (HCC) 03/17/2022   History of left below knee amputation (HCC) 02/25/2022   Swelling of arm 10/24/2021   Multiple thyroid nodules 10/19/2021   Chronic heart failure with preserved ejection fraction  (HFpEF) (HCC) 10/19/2021   Diabetic foot infection (HCC) 03/01/2021   PAD (peripheral artery disease) (HCC) 03/01/2021   Glaucoma (increased eye pressure) 03/01/2021   Pressure injury of skin 11/19/2020   Depression with anxiety 11/18/2020   BPH (benign prostatic hyperplasia) 11/18/2020   CVA (HCC) with residual right-sided weakness 08/22/2020   TIA (transient ischemic attack) 07/03/2020   Chest pain 06/25/2020   Pulmonary artery hypertension (HCC) 06/25/2020   CKD (chronic kidney disease), stage IV (HCC) 06/25/2020   History of cerebrovascular accident (CVA) with residual deficit 06/25/2020   Morbid (severe) obesity due to excess calories (HCC) 03/30/2020   Malnutrition of moderate degree 01/25/2020   Palliative care by specialist    Goals of care, counseling/discussion    Nephrotic syndrome 01/06/2020   Anasarca 01/06/2020   Generalized body aches 01/01/2020   Chronic diastolic CHF (congestive heart failure) (HCC) 01/01/2020   Iron deficiency anemia 01/01/2020   Uncontrolled type 2 diabetes mellitus with hyperglycemia (HCC)    Pure hypercholesterolemia    Nonobstructive atherosclerosis of coronary artery 09/28/2017   MDD (major depressive disorder), recurrent episode, severe (HCC) 07/30/2017   Osteoarthritis 04/12/2015   Substance-related disorder (HCC) 10/30/2014   Patient's noncompliance with other medical treatment and regimen 06/07/2014   Chronic pain 05/31/2014   History of noncompliance with medical treatment 05/31/2014   Malingering 05/31/2014   Pancytopenia (HCC) 05/31/2014   Diabetic neuropathy associated with diabetes mellitus due to underlying condition (HCC) 05/31/2014   Depression, major, recurrent, severe with psychosis (HCC) 08/29/2013   Type 2 diabetes mellitus (HCC) 06/30/2013   Essential hypertension 12/09/2011   PCP:  [name], MD Pharmacy:   [facility] [place] - [place], [place] - [address] [place]  [address] Phone: [phone] Fax: [phone]     Social Determinants of Health (SDOH) Social History: SDOH Screenings   Food Insecurity: No Food Insecurity (07/18/2022)  Housing: Low Risk  (07/18/2022)  Transportation Needs: No Transportation Needs (07/18/2022)  Utilities: Not At Risk (07/18/2022)  Alcohol Screen: Low Risk  (07/30/2017)  Depression (PHQ2-9): Low Risk  (07/10/2020)  Tobacco Use: Medium Risk (07/17/2022)   SDOH Interventions:     Readmission Risk Interventions    04/02/2022    3:21 PM 03/20/2022   12:20 PM 12/10/2021    8:41 AM  Readmission Risk Prevention Plan  Transportation Screening Complete Complete Complete  Medication Review Oceanographer) Complete Complete Complete  PCP or Specialist appointment within 3-5 days of discharge Complete Complete Complete  HRI or Home Care Consult Complete Complete Complete  SW Recovery Care/Counseling Consult Complete Complete Complete  Palliative Care Screening Not Applicable Not Applicable Not Applicable  Skilled Nursing Facility Complete Complete Complete

## 2022-07-19 DIAGNOSIS — I5033 Acute on chronic diastolic (congestive) heart failure: Secondary | ICD-10-CM | POA: Diagnosis not present

## 2022-07-19 LAB — TYPE AND SCREEN: Unit division: 0

## 2022-07-19 LAB — BASIC METABOLIC PANEL
Anion gap: 9 (ref 5–15)
BUN: 65 mg/dL — ABNORMAL HIGH (ref 6–20)
CO2: 19 mmol/L — ABNORMAL LOW (ref 22–32)
Calcium: 8 mg/dL — ABNORMAL LOW (ref 8.9–10.3)
Chloride: 109 mmol/L (ref 98–111)
Creatinine, Ser: 3.83 mg/dL — ABNORMAL HIGH (ref 0.61–1.24)
GFR, Estimated: 17 mL/min — ABNORMAL LOW (ref >60)
Glucose, Bld: 119 mg/dL — ABNORMAL HIGH (ref 70–99)
Potassium: 4.3 mmol/L (ref 3.5–5.1)
Sodium: 137 mmol/L (ref 135–145)

## 2022-07-19 LAB — CBC
HCT: 24.7 % — ABNORMAL LOW (ref 39.0–52.0)
Hemoglobin: 7.8 g/dL — ABNORMAL LOW (ref 13.0–17.0)
MCH: 28 pg (ref 26.0–34.0)
MCHC: 31.6 g/dL (ref 30.0–36.0)
MCV: 88.5 fL (ref 80.0–100.0)
Platelets: 79 10*3/uL — ABNORMAL LOW (ref 150–400)
RBC: 2.79 MIL/uL — ABNORMAL LOW (ref 4.22–5.81)
RDW: 19 % — ABNORMAL HIGH (ref 11.5–15.5)
WBC: 1.9 10*3/uL — ABNORMAL LOW (ref 4.0–10.5)
nRBC: 0 % (ref 0.0–0.2)

## 2022-07-19 LAB — GLUCOSE, CAPILLARY
Glucose-Capillary: 100 mg/dL — ABNORMAL HIGH (ref 70–99)
Glucose-Capillary: 103 mg/dL — ABNORMAL HIGH (ref 70–99)
Glucose-Capillary: 107 mg/dL — ABNORMAL HIGH (ref 70–99)
Glucose-Capillary: 109 mg/dL — ABNORMAL HIGH (ref 70–99)
Glucose-Capillary: 111 mg/dL — ABNORMAL HIGH (ref 70–99)
Glucose-Capillary: 140 mg/dL — ABNORMAL HIGH (ref 70–99)

## 2022-07-19 LAB — BPAM RBC
Blood Product Expiration Date: 202406262359
ISSUE DATE / TIME: 202405230952
ISSUE DATE / TIME: 202405231322
Unit Type and Rh: 5100
Unit Type and Rh: 5100

## 2022-07-19 LAB — HEMOGLOBIN AND HEMATOCRIT, BLOOD
HCT: 25.1 % — ABNORMAL LOW (ref 39.0–52.0)
Hemoglobin: 7.9 g/dL — ABNORMAL LOW (ref 13.0–17.0)

## 2022-07-19 MED ORDER — MUPIROCIN 2 % EX OINT
1.0000 | TOPICAL_OINTMENT | Freq: Two times a day (BID) | CUTANEOUS | Status: DC
  Administered 2022-07-19 – 2022-07-20 (×3): 1 via NASAL
  Filled 2022-07-19 (×2): qty 22

## 2022-07-19 MED ORDER — CHLORHEXIDINE GLUCONATE CLOTH 2 % EX PADS
6.0000 | MEDICATED_PAD | Freq: Every day | CUTANEOUS | Status: DC
  Administered 2022-07-20: 6 via TOPICAL

## 2022-07-19 MED ORDER — PANTOPRAZOLE SODIUM 40 MG PO TBEC
40.0000 mg | DELAYED_RELEASE_TABLET | Freq: Two times a day (BID) | ORAL | Status: DC
  Administered 2022-07-19 – 2022-07-20 (×2): 40 mg via ORAL
  Filled 2022-07-19 (×2): qty 1

## 2022-07-19 MED ORDER — HYDRALAZINE HCL 50 MG PO TABS
50.0000 mg | ORAL_TABLET | Freq: Three times a day (TID) | ORAL | Status: DC
  Administered 2022-07-19 – 2022-07-20 (×5): 50 mg via ORAL
  Filled 2022-07-19 (×5): qty 1

## 2022-07-19 NOTE — Consult Note (Signed)
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 May lift silicone foam to replace Xeroform daily. Change foam dressing q3 days and prn soiling.   Patient arousable at this visit, will open eyes but does not speak to [facility] nurse. Per previous note by [facility] team it would be advisable for patient to work with maker of his prosthesis on trying to obtain better fit and prevent continued rubbing of this area.   WOC team will not follow.  Re-consult as further needs arise.   Thank you,    Priscella Mann MSN, RN-BC, [employer] [phone]

## 2022-07-19 NOTE — Progress Notes (Signed)
PROGRESS NOTE    [name]  UJW:[id number] DOB: [birth year] DOA: 07/17/2022 PCP: [name], MD    Brief Narrative:   [name] is a 62 y.o. male with past medical history significant for chronic diastolic congestive heart failure, CKD stage IV, type 2 diabetes mellitus, peripheral vascular disease s/p left BKA, history of CVA with residual right-sided weakness, HTN, HLD, bipolar disorder, depression, BPH, history of cocaine abuse, morbid obesity who presented to [facility] ED via EMS from [facility], [place] with complaints of shortness of breath, chest pain ongoing for the last 2-3 weeks.  Recently diagnosed with bronchitis and started on azithromycin and Augmentin.  Patient reports medications he received at facility has not helped with his symptoms.  Patient unsure what medication he receives although.  He is also concerned that he has "too much fluid in his body".  States normally does not use oxygen continuously but over the last few days they have been giving him supplemental oxygen at the facility.  Denies hematemesis, no hematochezia, no melena.  No other complaints.  In the ED, temperature 97.9 F, HR 69, RR 18, BP 153/82, SpO2 99% on 2 L nasal cannula.  WBC 1.8 (2.0 on 07/09/2022), hemoglobin 6.0 (7.6 on 07/09/2022 w/ baseline around 7-8), platelets 85 (117 on 07/09/2022).  Sodium 138, potassium 4.6, chloride 108, CO2 19, glucose 125, BUN 68, creatinine 3.80.  AST 19, ALT 18, total bilirubin 0.7.  High sensitive troponin 25 followed by 11.  BNP 562.6.  Lactic acid 0.6.  Procalcitonin 0.13.  FOBT positive.  Chest x-ray with cardiomegaly, vascular congestion and edema.  Patient received IV Lasix x 1, started on ceftriaxone, and given 1 L NS bolus.  1 unit PRBC ordered.  [facility], Dr. Marca Ancona consulted.  TRH consulted for admission for further evaluation management for concern for acute diastolic CHF exacerbation, symptomatic anemia concerning for GI  bleed.  Assessment & Plan:   Acute on chronic diastolic congestive heart failure On arrival to the ED, patient was placed on 3 L supplemental oxygen due to increased work of breathing, unclear whether he was hypoxic but not on continuous home O2.  BNP notably elevated at 562 with chest x-ray showing cardiomegaly with vascular congestion and edema.  Recent TTE January 2024 with LVEF 50-55%, grade 2 diastolic dysfunction, mild AVR, mild AAS.  Since appears to be on Lasix as needed but unclear if receiving at his facility. -- Continue Lasix 40 mg IV every 24 hours -- Strict I's and O's and daily weights -- Continue to monitor renal function  Acute blood loss anemia secondary to suspected GI bleed Hemoglobin 6.0 on admission, previously 7.6 on 07/09/2022.  Baseline hemoglobin 7-8.  FOBT positive.  Prior colonoscopy October 2022 limited due to poor bowel prep.  Patient hemodynamically stable. -- [facility] following; appreciate assistance -- Hgb 6.0>6.3>8.6>7.8>7.9 -- s/p 1u pRBC 5/22, 2u pRBC 5/23 -- Protonix 40 mg IV every 12 hours -- CBC daily -- Clear liquid diet -- Await further recommendations from GI  Chest pain, atypical noncardiac Patient complaining of chest discomfort over the last 2-3 weeks.  High sensitive troponins not consistent with ACS, nor likely PE given no tachycardia and chest pain not pleuritic.  EKG with normal sinus rhythm, rate 68, no concerning ST elevation/depressions or T wave inversions.  History of prior cocaine abuse, UDS negative on admission.  Etiology likely related to acute blood loss anemia versus CHF exacerbation as above. -- Supportive care, monitor on  telemetry  Pancytopenia WBC 1.8 (absolute neutrophil count and absolute lymphocyte count low).  WBC count was 2.0 on labs done 07/09/2022 and was slightly low on previous labs done in January/February 2024).  No fever or signs of infection.  Procalcitonin at 0.13.  Also anemic and platelet count is low.  Platelet  count currently 85k and was 117k on labs done 07/09/2022 but previously normal.   -- CBC daily  CKD stage IV Chronic metabolic acidosis Baseline creatinine 3.9-4.1, stable. -- Cr 3.80>3.85>3.83 -- Sodium bicarbonate 1300 mg p.o. twice daily -- Monitor BMP daily  Type 2 diabetes mellitus Home medication includes Lantus 10 units subcutaneously every morning. -- holding home lantus for now; currently on clear liquid diet -- Sensitive SSI for coverage -- CBGs qAC/HS  Peripheral vascular disease s/p left BKA Left stump ulceration Notable 1 cm x 0.5 cm x 1.1 cm full-thickness ulceration left stump likely related to his underlying medical device. -- Wound care left stump: clean with normal saline, apply Xeroform gauze and cover with silicone foam, change foam dressing every 3 days and as needed for soiling. -- Holding aspirin for concern of GI bleed -- Continue statin  History of CVA with residual right-sided weakness -- Hold aspirin given concern for GI bleed -- Continue statin  Essential hypertension -- Amlodipine 10 mg p.o. daily -- Carvedilol 12.5 mg p.o. twice daily -- Furosemide 40 mg IV daily -- Hydralazine 50mg  PO q8h  Hyperlipidemia -- Atorvastatin 80 mg p.o. nightly  Bipolar disorder/depression -- Zoloft 50 mg p.o. every other day  BPH -- Tamsulosin 0.8 mg p.o. qHS   DVT prophylaxis: SCDs Start: 07/17/22 2149    Code Status: Full Code Family Communication: No family present at bedside this morning  Disposition Plan:  Level of care: Telemetry Status is: Inpatient Remains inpatient appropriate because: IV diuresis, awaiting further recommendations from GI    Consultants:  [facility]  Procedures:  None  Antimicrobials:  Ceftriaxone x 1 dose on 5/22   Subjective: Patient seen examined bedside, lying in bed.  RN present at bedside.  Asking if he can have something more to eat.  Hemoglobin appears stable, received blood transfusion yesterday.   Awaiting further recommendations from gastroenterology.   No other questions or concerns at this time.  Denies headache, no dizziness, no current chest pain, no palpitations, no nausea/vomiting/diarrhea, no fever/chills/night sweats, no cough/congestion, no paresthesias.  No acute events overnight per nurse staff.  Objective: Vitals:   07/19/22 0427 07/19/22 0500 07/19/22 0832 07/19/22 0835  BP: (!) 150/83  (!) 184/92 (!) 185/94  Pulse: 66  68 68  Resp: 20     Temp: 97.6 F (36.4 C)     TempSrc: Oral     SpO2: 91%     Weight:  128.5 kg    Height:        Intake/Output Summary (Last 24 hours) at 07/19/2022 1115 Last data filed at 07/19/2022 0300 Gross per 24 hour  Intake 1460 ml  Output 750 ml  Net 710 ml   Filed Weights   07/17/22 2252 07/18/22 0647 07/19/22 0500  Weight: 126.5 kg 126.5 kg 128.5 kg    Examination:  Physical Exam: GEN: NAD, alert and oriented x 3, chronically ill in appearance, appears older than stated age HEENT: NCAT, PERRL, EOMI, sclera clear, poor dentition PULM: Breath sounds slight diminished bilateral bases with crackles, no wheezing, normal respiratory effort without accessory muscle use, on room air at rest CV: RRR w/o M/G/R GI: abd soft,  NTND, NABS, no R/G/M MSK: Trace-1+ pitting edema right lower extremity, noted left BKA with sock in place, moves all extremities independently with noted chronic weakness right side, left stump ulceration noted     Data Reviewed: I have personally reviewed following labs and imaging studies  CBC: Recent Labs  Lab 07/17/22 1815 07/18/22 0532 07/18/22 1846 07/19/22 0513 07/19/22 1043  WBC 1.8* 2.0*  --  1.9*  --   NEUTROABS 0.9*  --   --   --   --   HGB 6.0* 6.3* 8.6* 7.8* 7.9*  HCT 19.9* 20.9* 26.8* 24.7* 25.1*  MCV 86.9 89.3  --  88.5  --   PLT 85* 77*  --  79*  --    Basic Metabolic Panel: Recent Labs  Lab 07/17/22 1815 07/18/22 0532 07/19/22 0513  NA 138 140 137  K 4.6 4.4 4.3  CL 108 112* 109   CO2 19* 18* 19*  GLUCOSE 125* 88 119*  BUN 68* 63* 65*  CREATININE 3.80* 3.85* 3.83*  CALCIUM 8.2* 8.0* 8.0*   GFR: Estimated Creatinine Clearance: 26.8 mL/min (A) (by C-G formula based on SCr of 3.83 mg/dL (H)). Liver Function Tests: Recent Labs  Lab 07/17/22 1815  AST 19  ALT 18  ALKPHOS 94  BILITOT 0.7  PROT 8.2*  ALBUMIN 3.1*   No results for input(s): "LIPASE", "AMYLASE" in the last 168 hours. No results for input(s): "AMMONIA" in the last 168 hours. Coagulation Profile: No results for input(s): "INR", "PROTIME" in the last 168 hours. Cardiac Enzymes: No results for input(s): "CKTOTAL", "CKMB", "CKMBINDEX", "TROPONINI" in the last 168 hours. BNP (last 3 results) No results for input(s): "PROBNP" in the last 8760 hours. HbA1C: No results for input(s): "HGBA1C" in the last 72 hours. CBG: Recent Labs  Lab 07/18/22 1557 07/18/22 2008 07/19/22 0001 07/19/22 0422 07/19/22 0758  GLUCAP 119* 147* 140* 111* 109*   Lipid Profile: No results for input(s): "CHOL", "HDL", "LDLCALC", "TRIG", "CHOLHDL", "LDLDIRECT" in the last 72 hours. Thyroid Function Tests: No results for input(s): "TSH", "T4TOTAL", "FREET4", "T3FREE", "THYROIDAB" in the last 72 hours. Anemia Panel: No results for input(s): "VITAMINB12", "FOLATE", "FERRITIN", "TIBC", "IRON", "RETICCTPCT" in the last 72 hours. Sepsis Labs: Recent Labs  Lab 07/17/22 1815 07/17/22 2029 07/18/22 0532  PROCALCITON  --   --  0.13  LATICACIDVEN 0.6 <0.3*  --     Recent Results (from the past 240 hour(s))  Blood culture (routine x 2)     Status: None (Preliminary result)   Collection Time: 07/17/22  6:15 PM   Specimen: BLOOD  Result Value Ref Range Status   Specimen Description   Final    BLOOD LEFT ANTECUBITAL Performed at [facility], [address], [place], [place] [address]    Special Requests   Final    BOTTLES DRAWN AEROBIC AND ANAEROBIC Blood Culture results may not be optimal due to an  excessive volume of blood received in culture bottles Performed at [facility], [address], [place], [place] [address]    Culture   Final    NO GROWTH 2 DAYS Performed at [facility], [address] [place], [place] [address]    Report Status PENDING  Incomplete  Blood culture (routine x 2)     Status: None (Preliminary result)   Collection Time: 07/17/22  7:12 PM   Specimen: BLOOD  Result Value Ref Range Status   Specimen Description   Final    BLOOD  SITE NOT SPECIFIED Performed at [facility], [address] [place], [place] [address]    Special Requests   Final    BOTTLES DRAWN AEROBIC AND ANAEROBIC Blood Culture results may not be optimal due to an inadequate volume of blood received in culture bottles Performed at [facility], [address], [place], [place] [address]    Culture   Final    NO GROWTH 2 DAYS Performed at [facility], [address] [place], [place] [address]    Report Status PENDING  Incomplete  MRSA Next Gen by PCR, Nasal     Status: Abnormal   Collection Time: 07/18/22  7:01 AM   Specimen: Nasal Mucosa; Nasal Swab  Result Value Ref Range Status   MRSA by PCR Next Gen DETECTED (A) NOT DETECTED Final    Comment: (NOTE) The GeneXpert MRSA Assay (FDA approved for NASAL specimens only), is one component of a comprehensive MRSA colonization surveillance program. It is not intended to diagnose MRSA infection nor to guide or monitor treatment for MRSA infections. Test performance is not FDA approved in patients less than 46 years old. Performed at [facility], [address] [place], [place] [address]          Radiology Studies: [facility] Chest Port 1 View  Result Date: 07/17/2022 CLINICAL DATA:  Shortness of breath. EXAM: PORTABLE CHEST 1 VIEW COMPARISON:  Chest radiograph dated 03/23/2022. FINDINGS: There is cardiomegaly with vascular congestion and edema.  Pneumonia is not excluded. No pleural effusion pneumothorax the degenerative changes of the acute osseous pathology. IMPRESSION: Cardiomegaly with vascular congestion and edema. Electronically Signed   By: Elgie Collard M.D.   On: 07/17/2022 19:43        Scheduled Meds:  sodium chloride   Intravenous Once   amLODipine  10 mg Oral Daily   atorvastatin  80 mg Oral QHS   brimonidine  1 drop Both Eyes BID   carvedilol  12.5 mg Oral BID WC   dorzolamide  1 drop Both Eyes BID   furosemide  40 mg Intravenous Daily   hydrALAZINE  50 mg Oral Q8H   insulin aspart  0-9 Units Subcutaneous TID WC   latanoprost  1 drop Both Eyes QHS   pantoprazole (PROTONIX) IV  40 mg Intravenous Q12H   pregabalin  75 mg Oral Daily   sertraline  50 mg Oral QODAY   sodium bicarbonate  1,300 mg Oral BID   tamsulosin  0.8 mg Oral QHS   Continuous Infusions:   LOS: 1 day    Time spent: 52 minutes spent on chart review, discussion with nursing staff, consultants, updating family and interview/physical exam; more than 50% of that time was spent in counseling and/or coordination of care.    [name] [name], DO [facility] Available via Epic secure chat 7am-7pm After these hours, please refer to coverage provider listed on [email or web address] 07/19/2022, 11:15 AM

## 2022-07-19 NOTE — Progress Notes (Addendum)
[facility] Gastroenterology Progress Note  BLANCA CAPERTON 62 y.o. 1960/10/27   Subjective: Feels ok. Denies abdominal pain. Brown stool this morning.  Objective: Vital signs: Vitals:   07/19/22 0832 07/19/22 0835  BP: (!) 184/92 (!) 185/94  Pulse: 68 68  Resp:    Temp:    SpO2:    T 97.6, R 20  Physical Exam: Gen: lethargic, no acute distress  HEENT: anicteric sclera CV: RRR Chest: CTA B Abd: soft, nontender, nondistended, +BS  Lab Results: Recent Labs    07/18/22 0532 07/19/22 0513  NA 140 137  K 4.4 4.3  CL 112* 109  CO2 18* 19*  GLUCOSE 88 119*  BUN 63* 65*  CREATININE 3.85* 3.83*  CALCIUM 8.0* 8.0*   Recent Labs    07/17/22 1815  AST 19  ALT 18  ALKPHOS 94  BILITOT 0.7  PROT 8.2*  ALBUMIN 3.1*   Recent Labs    07/17/22 1815 07/18/22 0532 07/18/22 1846 07/19/22 0513 07/19/22 1043  WBC 1.8* 2.0*  --  1.9*  --   NEUTROABS 0.9*  --   --   --   --   HGB 6.0* 6.3*   < > 7.8* 7.9*  HCT 19.9* 20.9*   < > 24.7* 25.1*  MCV 86.9 89.3  --  88.5  --   PLT 85* 77*  --  79*  --    < > = values in this interval not displayed.      Assessment/Plan: Symptomatic anemia without overt bleeding. Conservative management. Soft diet. Will sign off. Call if questions. Dr. Ewing Schlein on call this weekend.   Shirley Friar 07/19/2022, 11:33 AM  Questions please call [phone] ID: Ulice Brilliant, male   DOB: 1961-02-11, 62 y.o.   MRN: [id number]

## 2022-07-19 NOTE — TOC Progression Note (Signed)
Transition of Care [facility]) - Progression Note    Patient Details  Name: [name] MRN: [id number] Date of Birth: 1960-06-29  Transition of Care [facility]) CM/SW Contact  Latrish Mogel, Olegario Messier, RN Phone Number: 07/19/2022, 3:28 PM  Clinical Narrative:TC [place] rep Luanna Cole patient can return in am if stable. MD notified.        Expected Discharge Plan: Long Term Nursing Home Barriers to Discharge: Continued Medical Work up  Expected Discharge Plan and Services   Discharge Planning Services: CM Consult                                           Social Determinants of Health (SDOH) Interventions SDOH Screenings   Food Insecurity: No Food Insecurity (07/18/2022)  Housing: Low Risk  (07/18/2022)  Transportation Needs: No Transportation Needs (07/18/2022)  Utilities: Not At Risk (07/18/2022)  Alcohol Screen: Low Risk  (07/30/2017)  Depression (PHQ2-9): Low Risk  (07/10/2020)  Tobacco Use: Medium Risk (07/17/2022)    Readmission Risk Interventions    07/18/2022    3:54 PM 04/02/2022    3:21 PM 03/20/2022   12:20 PM  Readmission Risk Prevention Plan  Transportation Screening Complete Complete Complete  Medication Review Oceanographer) Complete Complete Complete  PCP or Specialist appointment within 3-5 days of discharge  Complete Complete  HRI or Home Care Consult  Complete Complete  SW Recovery Care/Counseling Consult Complete Complete Complete  Palliative Care Screening Not Applicable Not Applicable Not Applicable  Skilled Nursing Facility Complete Complete Complete

## 2022-07-19 NOTE — Progress Notes (Addendum)
Heart Failure Navigator Progress Note  Assessed for Heart & Vascular TOC clinic readiness.  Patient does not meet criteria due to EF 60_65% , per MD note admission etiology related to acute blood loss anemia versus CHF exacerbation. Palliative care following patient outpatient.    Navigator available for reassessment of patient.   Rhae Hammock, BSN, Scientist, clinical (histocompatibility and immunogenetics) Only

## 2022-07-20 DIAGNOSIS — I5033 Acute on chronic diastolic (congestive) heart failure: Secondary | ICD-10-CM | POA: Diagnosis not present

## 2022-07-20 LAB — BASIC METABOLIC PANEL
Anion gap: 11 (ref 5–15)
BUN: 61 mg/dL — ABNORMAL HIGH (ref 6–20)
CO2: 18 mmol/L — ABNORMAL LOW (ref 22–32)
Calcium: 8 mg/dL — ABNORMAL LOW (ref 8.9–10.3)
Chloride: 109 mmol/L (ref 98–111)
Creatinine, Ser: 3.74 mg/dL — ABNORMAL HIGH (ref 0.61–1.24)
GFR, Estimated: 18 mL/min — ABNORMAL LOW (ref >60)
Glucose, Bld: 118 mg/dL — ABNORMAL HIGH (ref 70–99)
Potassium: 4.3 mmol/L (ref 3.5–5.1)
Sodium: 138 mmol/L (ref 135–145)

## 2022-07-20 LAB — CBC
HCT: 27 % — ABNORMAL LOW (ref 39.0–52.0)
Hemoglobin: 8.5 g/dL — ABNORMAL LOW (ref 13.0–17.0)
MCH: 28.5 pg (ref 26.0–34.0)
MCHC: 31.5 g/dL (ref 30.0–36.0)
MCV: 90.6 fL (ref 80.0–100.0)
Platelets: 83 10*3/uL — ABNORMAL LOW (ref 150–400)
RBC: 2.98 MIL/uL — ABNORMAL LOW (ref 4.22–5.81)
RDW: 19.3 % — ABNORMAL HIGH (ref 11.5–15.5)
WBC: 1.8 10*3/uL — ABNORMAL LOW (ref 4.0–10.5)
nRBC: 0 % (ref 0.0–0.2)

## 2022-07-20 LAB — GLUCOSE, CAPILLARY
Glucose-Capillary: 110 mg/dL — ABNORMAL HIGH (ref 70–99)
Glucose-Capillary: 114 mg/dL — ABNORMAL HIGH (ref 70–99)
Glucose-Capillary: 116 mg/dL — ABNORMAL HIGH (ref 70–99)
Glucose-Capillary: 143 mg/dL — ABNORMAL HIGH (ref 70–99)
Glucose-Capillary: 153 mg/dL — ABNORMAL HIGH (ref 70–99)

## 2022-07-20 MED ORDER — PANTOPRAZOLE SODIUM 40 MG PO TBEC: 40.0000 mg | DELAYED_RELEASE_TABLET | Freq: Two times a day (BID) | ORAL | Status: DC

## 2022-07-20 NOTE — Progress Notes (Signed)
Report called to [employer], at [facility]. Awaiting for transport.

## 2022-07-20 NOTE — Progress Notes (Signed)
Attempted to call report to [phone], got VM, pressed for admissions options and nop one answered, received another VM. Charge RN made aware

## 2022-07-20 NOTE — TOC Transition Note (Addendum)
Transition of Care [facility]) - CM/SW Discharge Note   Patient Details  Name: [name] MRN: [id number] Date of Birth: February 22, 1961  Transition [facility] Care [facility]) CM/SW Contact:  Adrian Prows, RN Phone Number: 07/20/2022, 11:28 AM   Clinical Narrative:    D/C orders received; pt from [place] long-term  care; spoke w/ Luanna Cole at facility; she says pt can return; she will call back w/ room #; she gave call report # [phone]; pt's brother [name] [phone]) notified; pt asleep in room; awaiting room assignment.  -1225- call back from [place]; she gave RM# [id number]; [facility] called at 1223; spoke w/ Operator # [phone]; no TOC needs.   Final next level of care: Long Term Nursing Home Barriers to Discharge: No Barriers Identified   Patient Goals and CMS Choice CMS Medicare.gov Compare Post Acute Care list provided to:: Patient Represenative (must comment) Choice offered to / list presented to : Sibling  Discharge Placement                Patient chooses bed at:  (long-term care, [facility]) Patient to be transferred to facility by: [facility] Name of family member notified: [name] (brother) [phone] Patient and family notified of of transfer: 07/20/22  Discharge Plan and Services Additional resources added to the After Visit Summary for     Discharge Planning Services: CM Consult                                 Social Determinants of Health (SDOH) Interventions SDOH Screenings   Food Insecurity: No Food Insecurity (07/18/2022)  Housing: Low Risk  (07/18/2022)  Transportation Needs: No Transportation Needs (07/18/2022)  Utilities: Not At Risk (07/18/2022)  Alcohol Screen: Low Risk  (07/30/2017)  Depression (PHQ2-9): Low Risk  (07/10/2020)  Tobacco Use: Medium Risk (07/17/2022)     Readmission Risk Interventions    07/18/2022    3:54 PM 04/02/2022    3:21 PM 03/20/2022   12:20 PM  Readmission Risk Prevention Plan  Transportation Screening Complete  Complete Complete  Medication Review Oceanographer) Complete Complete Complete  PCP or Specialist appointment within 3-5 days of discharge  Complete Complete  HRI or Home Care Consult  Complete Complete  SW Recovery Care/Counseling Consult Complete Complete Complete  Palliative Care Screening Not Applicable Not Applicable Not Applicable  Skilled Nursing Facility Complete Complete Complete

## 2022-07-20 NOTE — Discharge Summary (Signed)
Physician Discharge Summary  [name] ZOX:[id number] DOB: Feb 08, 1961 DOA: 07/17/2022  PCP: [name], MD  Admit date: 07/17/2022 Discharge date: 07/20/2022  [facility]  Recommendations for Outpatient Follow-up:  Follow up with PCP in 1-2 weeks Protonix increased to 40 mg p.o. twice daily Please obtain BMP/CBC in one week Continue wound care to left stump ulceration    Discharge Condition: Stable CODE STATUS: Full code Diet recommendation: Heart healthy/consistent carbohydrate diet  History of present illness:  [name] is a 62 y.o. male with past medical history significant for chronic diastolic congestive heart failure, CKD stage IV, type 2 diabetes mellitus, peripheral vascular disease s/p left BKA, history of CVA with residual right-sided weakness, HTN, HLD, bipolar disorder, depression, BPH, history of cocaine abuse, morbid obesity who presented to [facility] ED via EMS from [facility], [place] with complaints of shortness of breath, chest pain ongoing for the last 2-3 weeks.  Recently diagnosed with bronchitis and started on azithromycin and Augmentin.  Patient reports medications he received at facility has not helped with his symptoms.  Patient unsure what medication he receives although.  He is also concerned that he has "too much fluid in his body".  States normally does not use oxygen continuously but over the last few days they have been giving him supplemental oxygen at the facility.  Denies hematemesis, no hematochezia, no melena.  No other complaints.   In the ED, temperature 97.9 F, HR 69, RR 18, BP 153/82, SpO2 99% on 2 L nasal cannula.  WBC 1.8 (2.0 on 07/09/2022), hemoglobin 6.0 (7.6 on 07/09/2022 w/ baseline around 7-8), platelets 85 (117 on 07/09/2022).  Sodium 138, potassium 4.6, chloride 108, CO2 19, glucose 125, BUN 68, creatinine 3.80.  AST 19, ALT 18, total bilirubin 0.7.  High  sensitive troponin 25 followed by 11.  BNP 562.6.  Lactic acid 0.6.  Procalcitonin 0.13.  FOBT positive.  Chest x-ray with cardiomegaly, vascular congestion and edema.  Patient received IV Lasix x 1, started on ceftriaxone, and given 1 L NS bolus.  1 unit PRBC ordered.  [facility], Dr. Marca Ancona consulted.  TRH consulted for admission for further evaluation management for concern for acute diastolic CHF exacerbation, symptomatic anemia concerning for GI bleed.  Hospital course:  Acute on chronic diastolic congestive heart failure On arrival to the ED, patient was placed on 3 L supplemental oxygen due to increased work of breathing, unclear whether he was hypoxic but not on continuous home O2.  BNP notably elevated at 562 with chest x-ray showing cardiomegaly with vascular congestion and edema.  Recent TTE January 2024 with LVEF 50-55%, grade 2 diastolic dysfunction, mild AVR, mild AAS.  She was started on IV Lasix and oxygen was titrated off.  Resume torsemide on discharge.   Acute blood loss anemia secondary to suspected GI bleed Hemoglobin 6.0 on admission, previously 7.6 on 07/09/2022.  Baseline hemoglobin 7-8.  FOBT positive.  Prior colonoscopy October 2022 limited due to poor bowel prep.  Patient hemodynamically stable.  [facility] was consulted and followed during hospital course.  Patient was transfused 3 unit PRBCs during hospitalization.  Patient was started on IV Protonix twice daily.  Gastroenterology did not seem need for endoscopic procedure during this hospitalization as his hemoglobin remained stable.  Hemoglobin 8.5 at time of discharge.  Continue Protonix 40 mg p.o. twice daily.  Recommend repeat CBC 1 week.   Chest pain, atypical noncardiac Patient complaining  of chest discomfort over the last 2-3 weeks.  High sensitive troponins not consistent with ACS, nor likely PE given no tachycardia and chest pain not pleuritic.  EKG with normal sinus rhythm, rate 68, no  concerning ST elevation/depressions or T wave inversions.  History of prior cocaine abuse, UDS negative on admission.  Etiology likely related to acute blood loss anemia versus CHF exacerbation as above.   Pancytopenia WBC 1.8 (absolute neutrophil count and absolute lymphocyte count low).  WBC count was 2.0 on labs done 07/09/2022 and was slightly low on previous labs done in January/February 2024).  No fever or signs of infection.  Procalcitonin at 0.13.  Also anemic and platelet count is low.  Platelet count currently 85k and was 117k on labs done 07/09/2022 but previously normal.  Recommend repeat CBC 1 week.   CKD stage IV Chronic metabolic acidosis Baseline creatinine 3.9-4.1, stable.  Creatinine 3.74 at time of discharge.  Recommend repeat BMP 1 week.  Continue sodium bicarbonate.   Type 2 diabetes mellitus Home medication includes Lantus 10 units subcutaneously every morning.   Peripheral vascular disease s/p left BKA Left stump ulceration Notable 1 cm x 0.5 cm x 1.1 cm full-thickness ulceration left stump likely related to his underlying medical device. Wound care left stump: clean with normal saline, apply Xeroform gauze and cover with silicone foam, change foam dressing every 3 days and as needed for soiling.  Continue aspirin/statin.   History of CVA with residual right-sided weakness Continue aspirin/statin   Essential hypertension Continue amlodipine, carvedilol, torsemide, hydralazine   Hyperlipidemia Atorvastatin 80 mg p.o. nightly   Bipolar disorder/depression Zoloft 50 mg p.o. every other day   BPH Tamsulosin 0.8 mg p.o. qHS  Discharge Diagnoses:  Principal Problem:   Acute on chronic heart failure with preserved ejection fraction (HFpEF) (HCC) Active Problems:   Type 2 diabetes mellitus (HCC)   CKD (chronic kidney disease), stage IV (HCC)   Pancytopenia (HCC)   Chest pain   Acute blood loss anemia   Acute GI bleeding   GI bleed    Discharge  Instructions  Discharge Instructions     Call MD for:  difficulty breathing, headache or visual disturbances   Complete by: As directed    Call MD for:  extreme fatigue   Complete by: As directed    Call MD for:  persistant dizziness or light-headedness   Complete by: As directed    Call MD for:  persistant nausea and vomiting   Complete by: As directed    Call MD for:  severe uncontrolled pain   Complete by: As directed    Call MD for:  temperature >100.4   Complete by: As directed    Diet - low sodium heart healthy   Complete by: As directed    Discharge wound care:   Complete by: As directed    Clean left stump wound with NS, apply Xeroform gauze, cut to fit wound bed daily and cover with silicone foam. May lift silicone foam to replace Xeroform daily. Change foam dressing q3 days and prn soiling.   Increase activity slowly   Complete by: As directed       Allergies as of 07/20/2022       Reactions   Sertraline Other (See Comments)   Reaction not known        Medication List     STOP taking these medications    furosemide 40 MG tablet Commonly known as: Lasix   furosemide 80 MG  tablet Commonly known as: LASIX       TAKE these medications    acetaminophen 650 MG CR tablet Commonly known as: TYLENOL Take 650 mg by mouth every 4 (four) hours as needed (for discomfort- cannot exceed 3,000 mg from all combined sources/24 hours).   albuterol 108 (90 Base) MCG/ACT inhaler Commonly known as: VENTOLIN HFA Inhale 2 puffs into the lungs every 6 (six) hours as needed for wheezing or shortness of breath.   amLODipine 10 MG tablet Commonly known as: NORVASC Take 1 tablet (10 mg total) by mouth daily. What changed:  when to take this additional instructions   ascorbic acid 500 MG tablet Commonly known as: VITAMIN C Take 500 mg by mouth 2 (two) times daily.   aspirin 81 MG chewable tablet Chew 81 mg by mouth daily.   atorvastatin 80 MG tablet Commonly known  as: LIPITOR Take 80 mg by mouth at bedtime.   brimonidine 0.2 % ophthalmic solution Commonly known as: ALPHAGAN Place 1 drop into both eyes 2 (two) times daily.   carvedilol 12.5 MG tablet Commonly known as: COREG Take 1 tablet (12.5 mg total) by mouth 2 (two) times daily with a meal.   docusate sodium 100 MG capsule Commonly known as: COLACE Take 100 mg by mouth at bedtime.   dorzolamide 2 % ophthalmic solution Commonly known as: TRUSOPT Place 1 drop into both eyes 2 (two) times daily.   feeding supplement (NEPRO CARB STEADY) Liqd Take 237 mLs by mouth at bedtime.   ferrous sulfate 325 (65 FE) MG tablet Take 1 tablet (325 mg total) by mouth daily.   hydrALAZINE 50 MG tablet Commonly known as: APRESOLINE Take 1 tablet (50 mg total) by mouth every 8 (eight) hours. What changed:  when to take this additional instructions   Lantus SoloStar 100 UNIT/ML Solostar Pen Generic drug: insulin glargine Inject 10 Units into the skin in the morning.   latanoprost 0.005 % ophthalmic solution Commonly known as: XALATAN Place 1 drop into both eyes at bedtime.   multivitamin tablet Take 1 tablet by mouth See admin instructions. Take 1 tablet by mouth at 9 AM and 10 AM   NovoLOG FlexPen 100 UNIT/ML FlexPen Generic drug: insulin aspart Inject 2-10 Units into the skin See admin instructions. Inject 2-10 units into the skin three times a day before meals, per sliding scale: BGL 131-180 = 2 units; 181-240 = 4 units; 241-300 = 6 units; 301-350 = 8 units; 351-400 = 10 units; >400 = CALL MD   ondansetron 4 MG tablet Commonly known as: ZOFRAN Take 4 mg by mouth every 4 (four) hours as needed for nausea or vomiting.   OXYGEN Inhale 2 L/min into the lungs as needed (for sats below 90%).   pantoprazole 40 MG tablet Commonly known as: PROTONIX Take 1 tablet (40 mg total) by mouth 2 (two) times daily. What changed: when to take this   potassium chloride SA 20 MEQ tablet Commonly known  as: KLOR-CON M Take 20 mEq by mouth in the morning.   pregabalin 75 MG capsule Commonly known as: LYRICA Take 1 capsule (75 mg total) by mouth daily.   PROTEIN PO Take 30 mLs by mouth 2 (two) times daily.   saccharomyces boulardii 250 MG capsule Commonly known as: FLORASTOR Take 1 capsule (250 mg total) by mouth 2 (two) times daily.   sertraline 50 MG tablet Commonly known as: ZOLOFT Take 50 mg by mouth every other day.   sodium bicarbonate 650 MG  tablet Take 2 tablets (1,300 mg total) by mouth 2 (two) times daily. What changed: how much to take   tamsulosin 0.4 MG Caps capsule Commonly known as: FLOMAX Take 0.8 mg by mouth at bedtime.   torsemide 20 MG tablet Commonly known as: DEMADEX Take 40-80 mg by mouth See admin instructions. Take 80 mg by mouth at 8 AM and 40 mg at 7 PM   Vitamin D (Ergocalciferol) 1.25 MG (50000 UNIT) Caps capsule Commonly known as: DRISDOL Take 50,000 Units by mouth every 30 (thirty) days. 17th of each month   zinc sulfate 220 (50 Zn) MG capsule Take 220 mg by mouth daily.               Discharge Care Instructions  (From admission, onward)           Start     Ordered   07/20/22 0000  Discharge wound care:       Comments: Clean left stump wound with NS, apply Xeroform gauze, cut to fit wound bed daily and cover with silicone foam. May lift silicone foam to replace Xeroform daily. Change foam dressing q3 days and prn soiling.   07/20/22 1042            Follow-up Information     [name], MD. Schedule an appointment as soon as possible for a visit in 1 week(s).   Specialty: Internal Medicine Contact information: [address] [place] [address] [phone]                Allergies  Allergen Reactions   Sertraline Other (See Comments)    Reaction not known    Consultations: [facility]   Procedures/Studies: DG Chest Port 1 View  Result Date: 07/17/2022 CLINICAL DATA:  Shortness  of breath. EXAM: PORTABLE CHEST 1 VIEW COMPARISON:  Chest radiograph dated 03/23/2022. FINDINGS: There is cardiomegaly with vascular congestion and edema. Pneumonia is not excluded. No pleural effusion pneumothorax the degenerative changes of the acute osseous pathology. IMPRESSION: Cardiomegaly with vascular congestion and edema. Electronically Signed   By: Elgie Collard M.D.   On: 07/17/2022 19:43     Subjective: Patient seen examined bedside, resting comfortably.  Lying in bed.  Hemoglobin stable and GI now signed off with no plan for endoscopic procedures during this hospitalization.  Discharging back to SNF today.  No other questions or concerns at this time.  Denies headache, no dizziness, no chest pain, no shortness of breath, no abdominal pain, no fever, no nausea/vomiting/diarrhea.  No acute events overnight per nursing staff.  Discharge Exam: Vitals:   07/19/22 2031 07/20/22 0501  BP: (!) 158/83 (!) 161/88  Pulse: 69 70  Resp: 20 19  Temp: 98.2 F (36.8 C) 98.2 F (36.8 C)  SpO2: 94% 94%   Vitals:   07/19/22 1752 07/19/22 2031 07/20/22 0501 07/20/22 0720  BP: (!) 161/84 (!) 158/83 (!) 161/88   Pulse: 69 69 70   Resp:  20 19   Temp:  98.2 F (36.8 C) 98.2 F (36.8 C)   TempSrc:  Oral Oral   SpO2:  94% 94%   Weight:    125.8 kg  Height:        Physical Exam: GEN: NAD, alert and oriented x 3, chronically ill in appearance, appears older than stated age HEENT: NCAT, PERRL, EOMI, sclera clear, poor dentition PULM: Breath sounds slight diminished bilateral bases with crackles, no wheezing, normal respiratory effort without accessory muscle use, on room air at  rest CV: RRR w/o M/G/R GI: abd soft, NTND, NABS, no R/G/M MSK: Trace-1+ pitting edema right lower extremity, noted left BKA with sock in place with ulceration noted to left stump, moves all extremities independently with noted chronic weakness right side, right lower extremity with chronic skin changes and woody  appearance    The results of significant diagnostics from this hospitalization (including imaging, microbiology, ancillary and laboratory) are listed below for reference.     Microbiology: Recent Results (from the past 240 hour(s))  Blood culture (routine x 2)     Status: None (Preliminary result)   Collection Time: 07/17/22  6:15 PM   Specimen: BLOOD  Result Value Ref Range Status   Specimen Description   Final    BLOOD LEFT ANTECUBITAL Performed at [facility], [address] [place], [place] [address]    Special Requests   Final    BOTTLES DRAWN AEROBIC AND ANAEROBIC Blood Culture results may not be optimal due to an excessive volume of blood received in culture bottles Performed at [facility], [address], [place], [place] [address]    Culture   Final    NO GROWTH 3 DAYS Performed at [facility], [address], [place], [place] [address]    Report Status PENDING  Incomplete  Blood culture (routine x 2)     Status: None (Preliminary result)   Collection Time: 07/17/22  7:12 PM   Specimen: BLOOD  Result Value Ref Range Status   Specimen Description   Final    BLOOD SITE NOT SPECIFIED Performed at [facility], [address], [place], [place] [address]    Special Requests   Final    BOTTLES DRAWN AEROBIC AND ANAEROBIC Blood Culture results may not be optimal due to an inadequate volume of blood received in culture bottles Performed at [facility], [address] [place], [place] [address]    Culture   Final    NO GROWTH 3 DAYS Performed at [facility], [address], [place], [place] [address]    Report Status PENDING  Incomplete  MRSA Next Gen by PCR, Nasal     Status: Abnormal   Collection Time: 07/18/22  7:01 AM   Specimen: Nasal Mucosa; Nasal Swab  Result Value Ref Range Status   MRSA by PCR Next Gen DETECTED (A) NOT DETECTED Final    Comment: (NOTE) The  GeneXpert MRSA Assay (FDA approved for NASAL specimens only), is one component of a comprehensive MRSA colonization surveillance program. It is not intended to diagnose MRSA infection nor to guide or monitor treatment for MRSA infections. Test performance is not FDA approved in patients less than 2 years old. Performed at [facility], [address] [place], [place] [address]      Labs: BNP (last 3 results) Recent Labs    03/18/22 0404 03/27/22 0805 07/17/22 1815  BNP 575.9* 242.0* 562.6*   Basic Metabolic Panel: Recent Labs  Lab 07/17/22 1815 07/18/22 0532 07/19/22 0513 07/20/22 0747  NA 138 140 137 138  K 4.6 4.4 4.3 4.3  CL 108 112* 109 109  CO2 19* 18* 19* 18*  GLUCOSE 125* 88 119* 118*  BUN 68* 63* 65* 61*  CREATININE 3.80* 3.85* 3.83* 3.74*  CALCIUM 8.2* 8.0* 8.0* 8.0*   Liver Function Tests: Recent Labs  Lab 07/17/22 1815  AST 19  ALT 18  ALKPHOS 94  BILITOT 0.7  PROT 8.2*  ALBUMIN 3.1*   No results for input(s): "LIPASE", "AMYLASE" in the last 168 hours. No results for input(s): "AMMONIA" in the last 168 hours. CBC: Recent Labs  Lab 07/17/22 1815 07/18/22 0532 07/18/22 1846 07/19/22 0513 07/19/22 1043 07/20/22 0747  WBC 1.8* 2.0*  --  1.9*  --  1.8*  NEUTROABS 0.9*  --   --   --   --   --   HGB 6.0* 6.3* 8.6* 7.8* 7.9* 8.5*  HCT 19.9* 20.9* 26.8* 24.7* 25.1* 27.0*  MCV 86.9 89.3  --  88.5  --  90.6  PLT 85* 77*  --  79*  --  83*   Cardiac Enzymes: No results for input(s): "CKTOTAL", "CKMB", "CKMBINDEX", "TROPONINI" in the last 168 hours. BNP: Invalid input(s): "POCBNP" CBG: Recent Labs  Lab 07/19/22 1606 07/19/22 2029 07/20/22 0010 07/20/22 0458 07/20/22 0744  GLUCAP 103* 107* 110* 143* 114*   D-Dimer No results for input(s): "DDIMER" in the last 72 hours. Hgb A1c No results for input(s): "HGBA1C" in the last 72 hours. Lipid Profile No results for input(s): "CHOL", "HDL", "LDLCALC", "TRIG", "CHOLHDL",  "LDLDIRECT" in the last 72 hours. Thyroid function studies No results for input(s): "TSH", "T4TOTAL", "T3FREE", "THYROIDAB" in the last 72 hours.  Invalid input(s): "FREET3" Anemia work up No results for input(s): "VITAMINB12", "FOLATE", "FERRITIN", "TIBC", "IRON", "RETICCTPCT" in the last 72 hours. Urinalysis    Component Value Date/Time   COLORURINE STRAW (A) 03/20/2022 0817   APPEARANCEUR CLEAR 03/20/2022 0817   APPEARANCEUR Clear 12/31/2013 2116   LABSPEC 1.009 03/20/2022 0817   LABSPEC 1.032 12/31/2013 2116   PHURINE 5.0 03/20/2022 0817   GLUCOSEU 50 (A) 03/20/2022 0817   GLUCOSEU >=500 12/31/2013 2116   HGBUR NEGATIVE 03/20/2022 0817   BILIRUBINUR NEGATIVE 03/20/2022 0817   BILIRUBINUR Negative 12/31/2013 2116   KETONESUR NEGATIVE 03/20/2022 0817   PROTEINUR >=300 (A) 03/20/2022 0817   UROBILINOGEN 1.0 08/28/2013 1709   NITRITE NEGATIVE 03/20/2022 0817   LEUKOCYTESUR NEGATIVE 03/20/2022 0817   LEUKOCYTESUR Negative 12/31/2013 2116   Sepsis Labs Recent Labs  Lab 07/17/22 1815 07/18/22 0532 07/19/22 0513 07/20/22 0747  WBC 1.8* 2.0* 1.9* 1.8*   Microbiology Recent Results (from the past 240 hour(s))  Blood culture (routine x 2)     Status: None (Preliminary result)   Collection Time: 07/17/22  6:15 PM   Specimen: BLOOD  Result Value Ref Range Status   Specimen Description   Final    BLOOD LEFT ANTECUBITAL Performed at [facility] [address] [place], [place] [address]    Special Requests   Final    BOTTLES DRAWN AEROBIC AND ANAEROBIC Blood Culture results may not be optimal due to an excessive volume of blood received in culture bottles Performed at [facility], [address] [place], [place] [address]    Culture   Final    NO GROWTH 3 DAYS Performed at [facility], [address], [place], [place] [address]    Report Status PENDING  Incomplete  Blood culture (routine x 2)     Status: None (Preliminary  result)   Collection Time: 07/17/22  7:12 PM   Specimen: BLOOD  Result Value Ref Range Status   Specimen Description   Final    BLOOD SITE NOT SPECIFIED Performed at [facility], [address], [place], [place] [address]    Special Requests   Final    BOTTLES DRAWN AEROBIC AND ANAEROBIC Blood Culture results may  not be optimal due to an inadequate volume of blood received in culture bottles Performed at [facility], [address], [place], [place] [address]    Culture   Final    NO GROWTH 3 DAYS Performed at [facility], [address] [place], [place] [address]    Report Status PENDING  Incomplete  MRSA Next Gen by PCR, Nasal     Status: Abnormal   Collection Time: 07/18/22  7:01 AM   Specimen: Nasal Mucosa; Nasal Swab  Result Value Ref Range Status   MRSA by PCR Next Gen DETECTED (A) NOT DETECTED Final    Comment: (NOTE) The GeneXpert MRSA Assay (FDA approved for NASAL specimens only), is one component of a comprehensive MRSA colonization surveillance program. It is not intended to diagnose MRSA infection nor to guide or monitor treatment for MRSA infections. Test performance is not FDA approved in patients less than 79 years old. Performed at [facility], [address], [place], [place] [address]      Time coordinating discharge: Over 30 minutes  SIGNED:   [name] [name], DO  [facility] 07/20/2022, 10:46 AM

## 2022-07-20 NOTE — Plan of Care (Signed)

## 2022-07-22 LAB — CULTURE, BLOOD (ROUTINE X 2)
Culture: NO GROWTH
Culture: NO GROWTH

## 2022-07-26 IMAGING — DX DG WRIST COMPLETE 3+V*R*
3 series · 3 of 3 positions shown · non-contrast
Comparison: None.

CLINICAL DATA: Status post fall.

EXAM:
RIGHT WRIST - COMPLETE 3+ VIEW

[wrist ap]
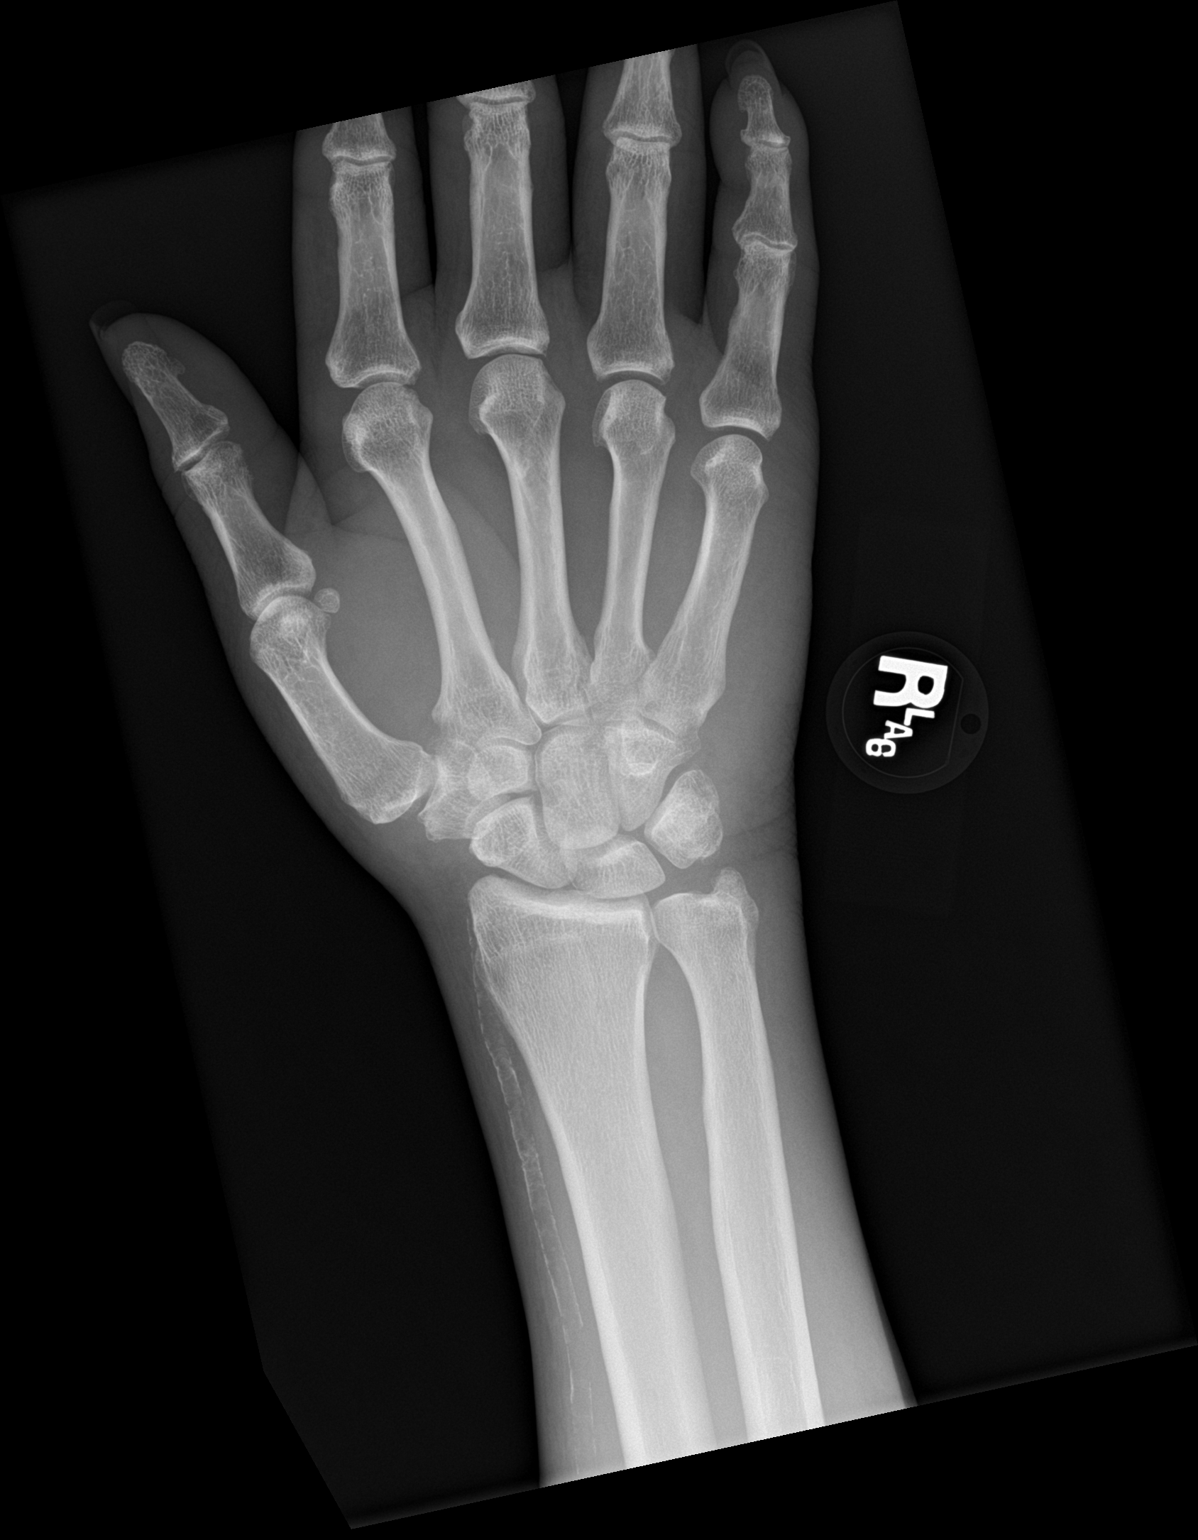

[wrist obl]
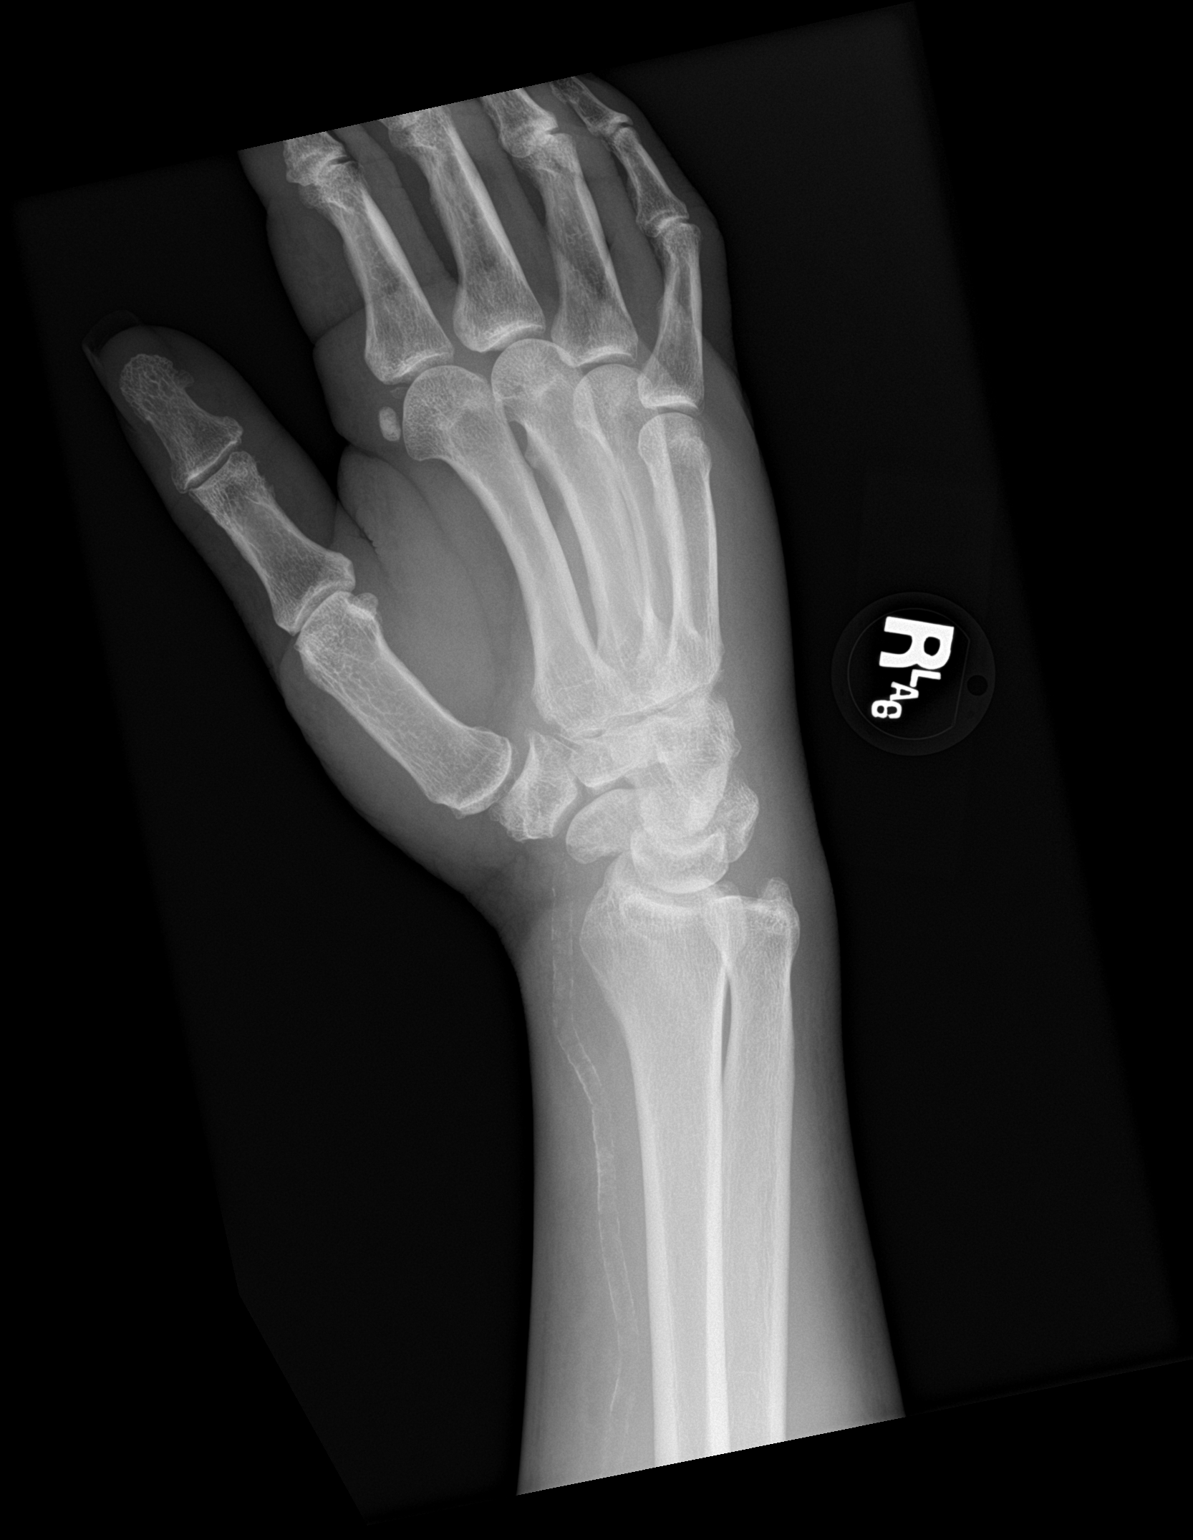

[wrist lat]
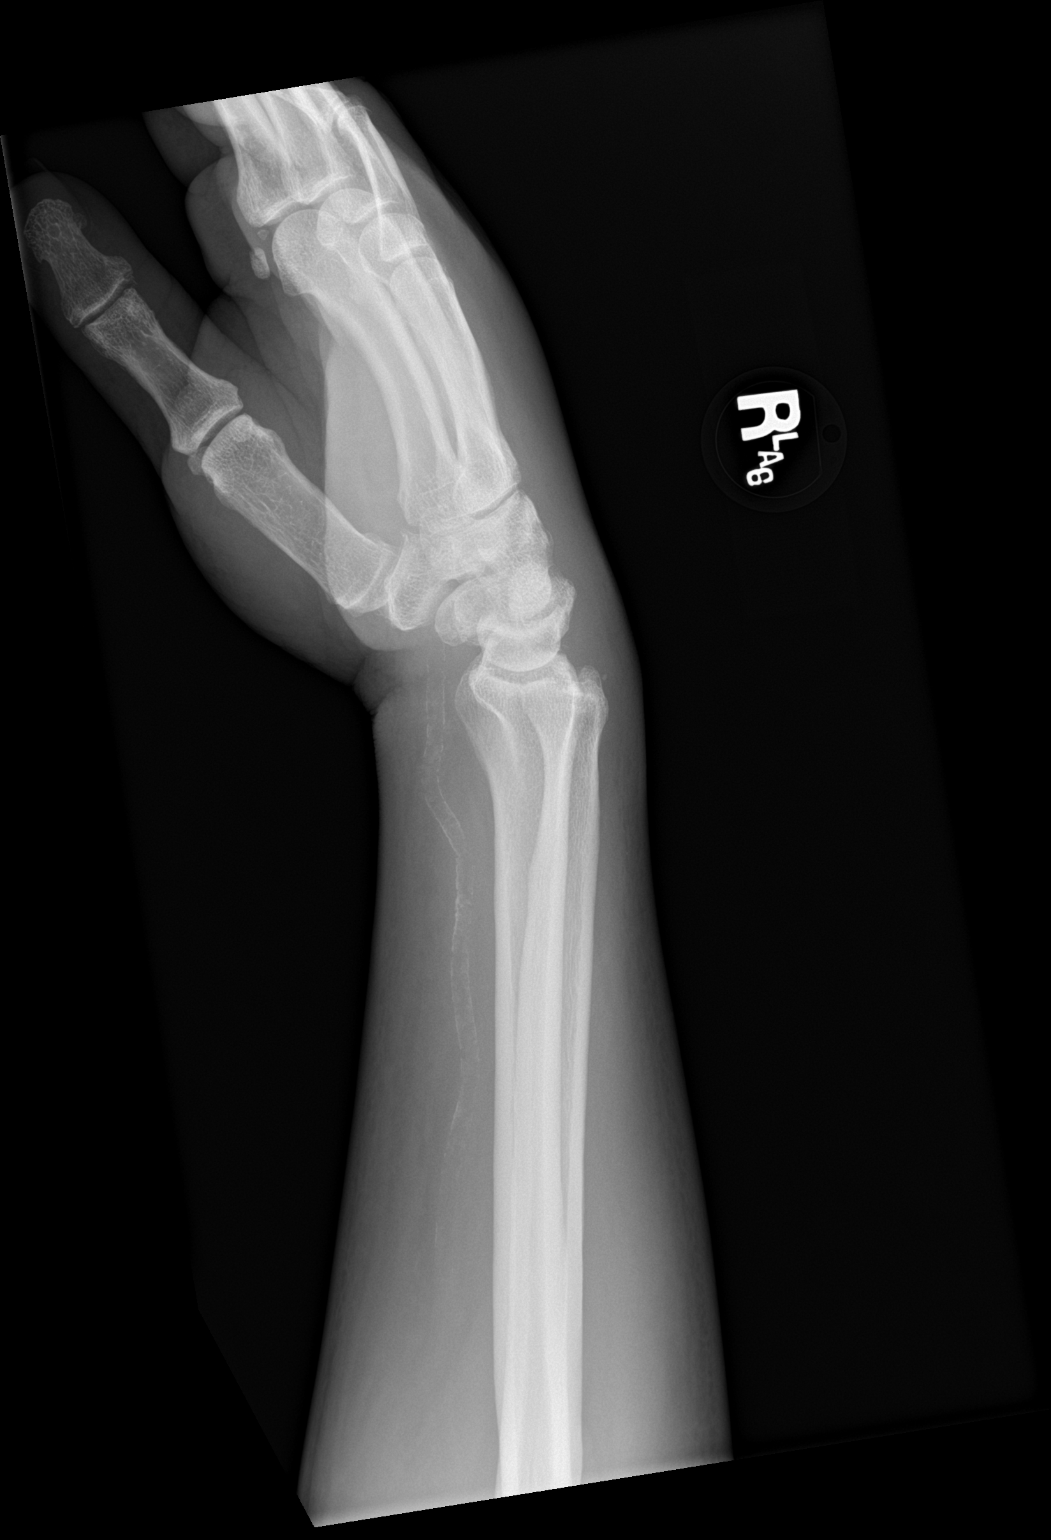

[3 of 3 positions shown; findings below may reference images not displayed]

FINDINGS: There is no evidence of fracture or dislocation. There is no
evidence of arthropathy or other focal bone abnormality. There is
moderate severity vascular calcification. Mild dorsal soft tissue
swelling is seen.
IMPRESSION: 1. Mild dorsal soft tissue swelling without an acute osseous
abnormality.

## 2022-07-26 IMAGING — CR DG SHOULDER 2+V PORT*R*
2 series · 2 of 2 positions shown · non-contrast
Comparison: None.

CLINICAL DATA: Pain after a fall.

EXAM:
PORTABLE RIGHT SHOULDER

[x shoulder ap right (1 of 2)]
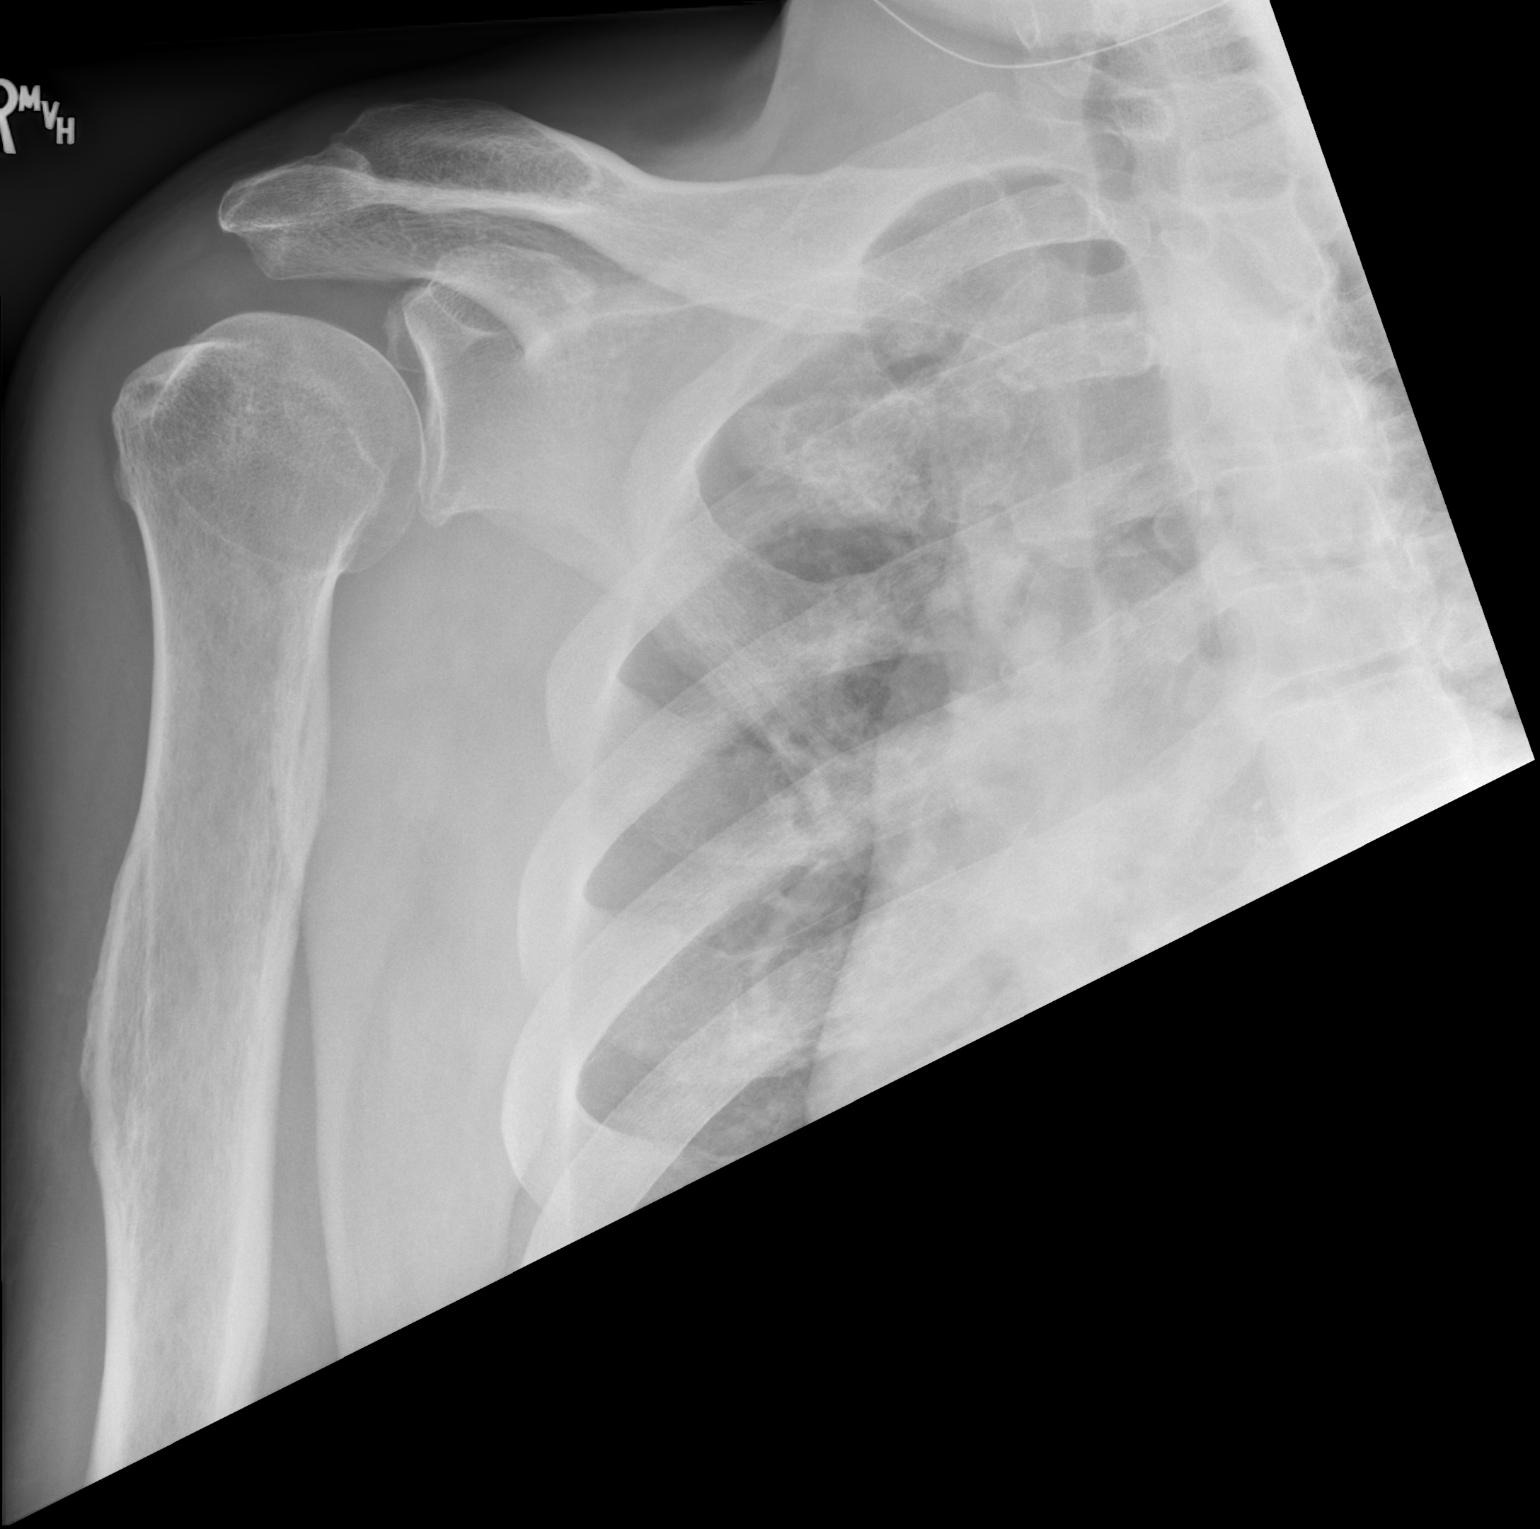

[x shoulder ap right (2 of 2)]
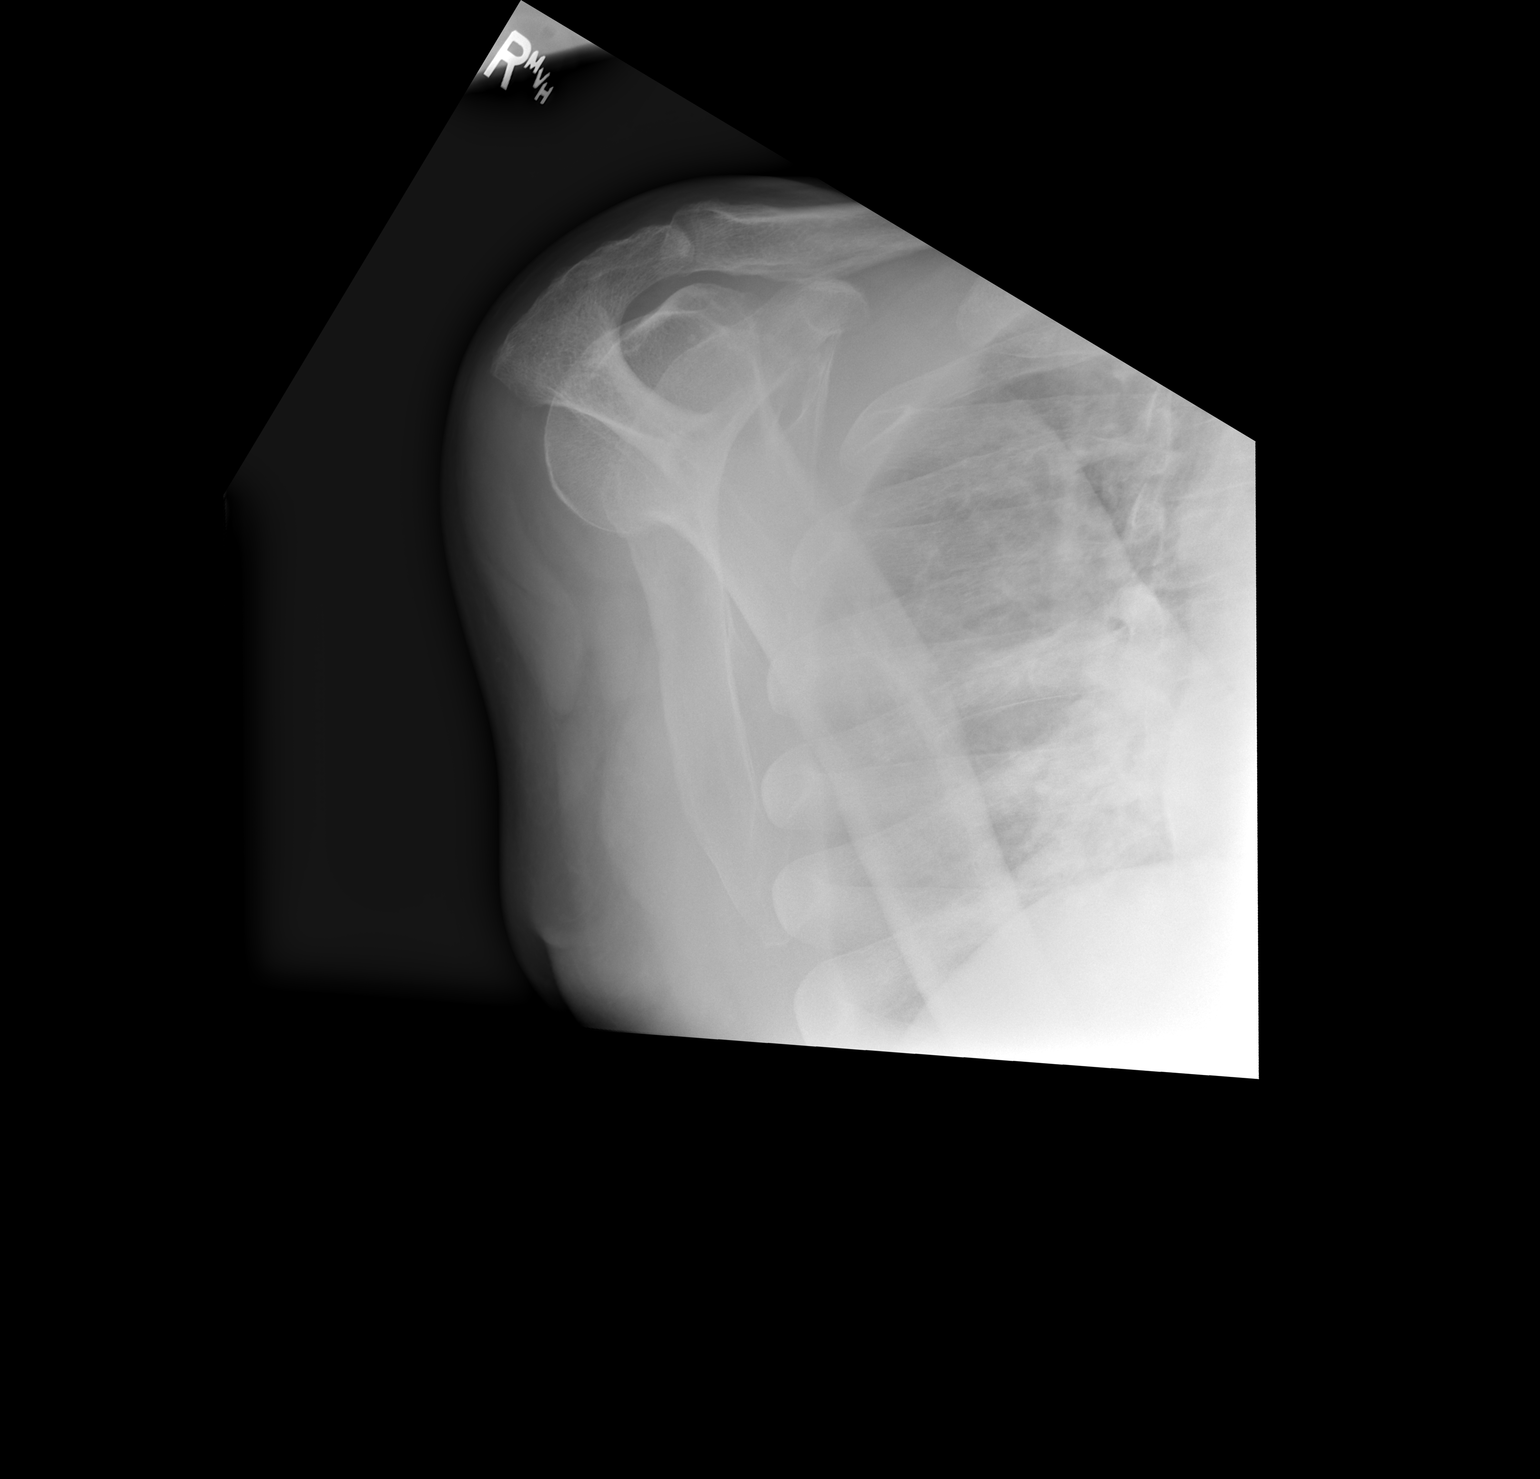

[2 of 2 positions shown; findings below may reference images not displayed]

FINDINGS: Degenerative changes in the glenohumeral joint. No acute fracture or
dislocation. Coracoclavicular and acromioclavicular spaces are
maintained. Soft tissues are unremarkable.
IMPRESSION: Degenerative changes.  No acute displaced fractures identified.

## 2022-07-26 IMAGING — DX DG ELBOW COMPLETE 3+V*R*
5 series · 5 of 5 positions shown · non-contrast
Comparison: None.

CLINICAL DATA: Status post fall.

EXAM:
RIGHT ELBOW - COMPLETE 3+ VIEW

[abdomen kub (1 of 5)]
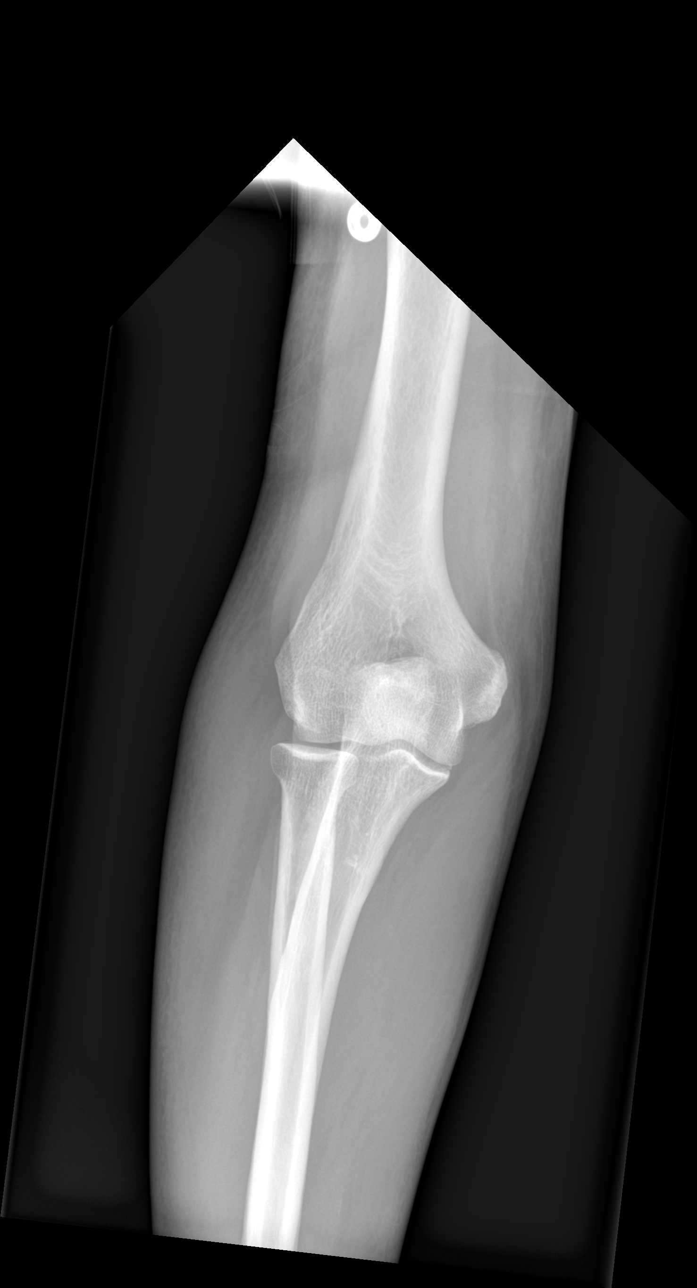

[abdomen kub (2 of 5)]
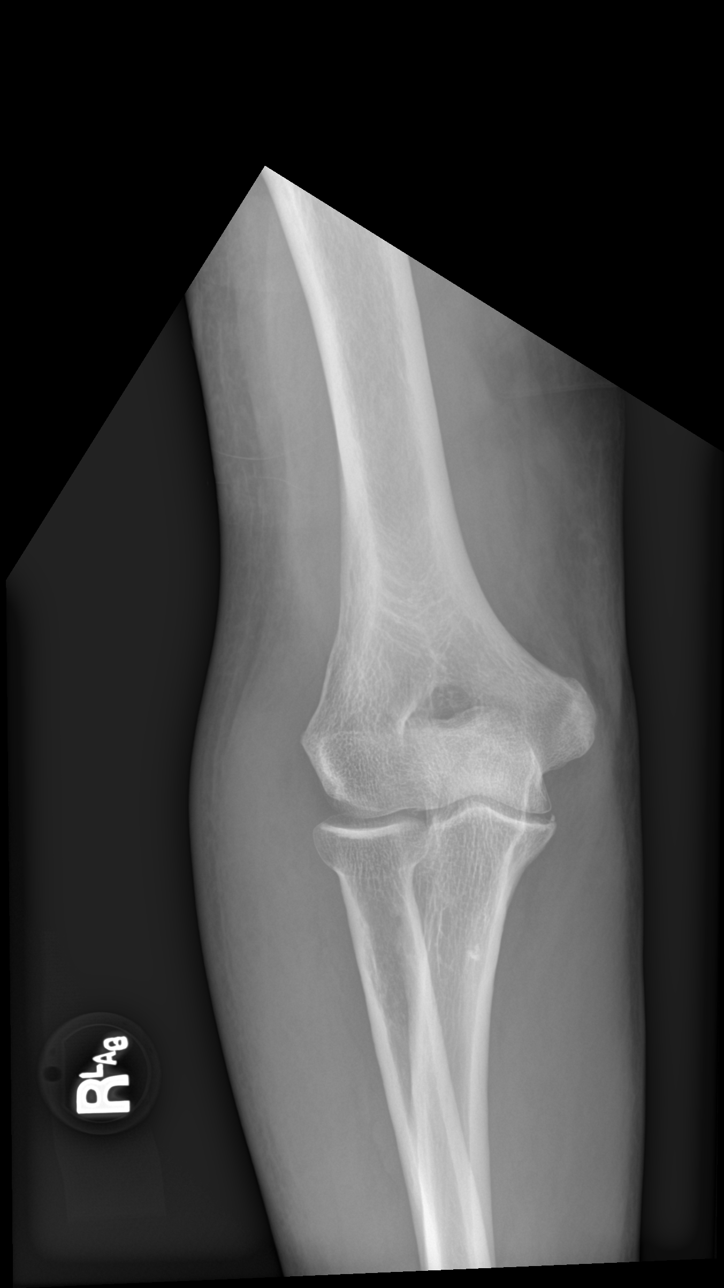

[abdomen kub (3 of 5)]
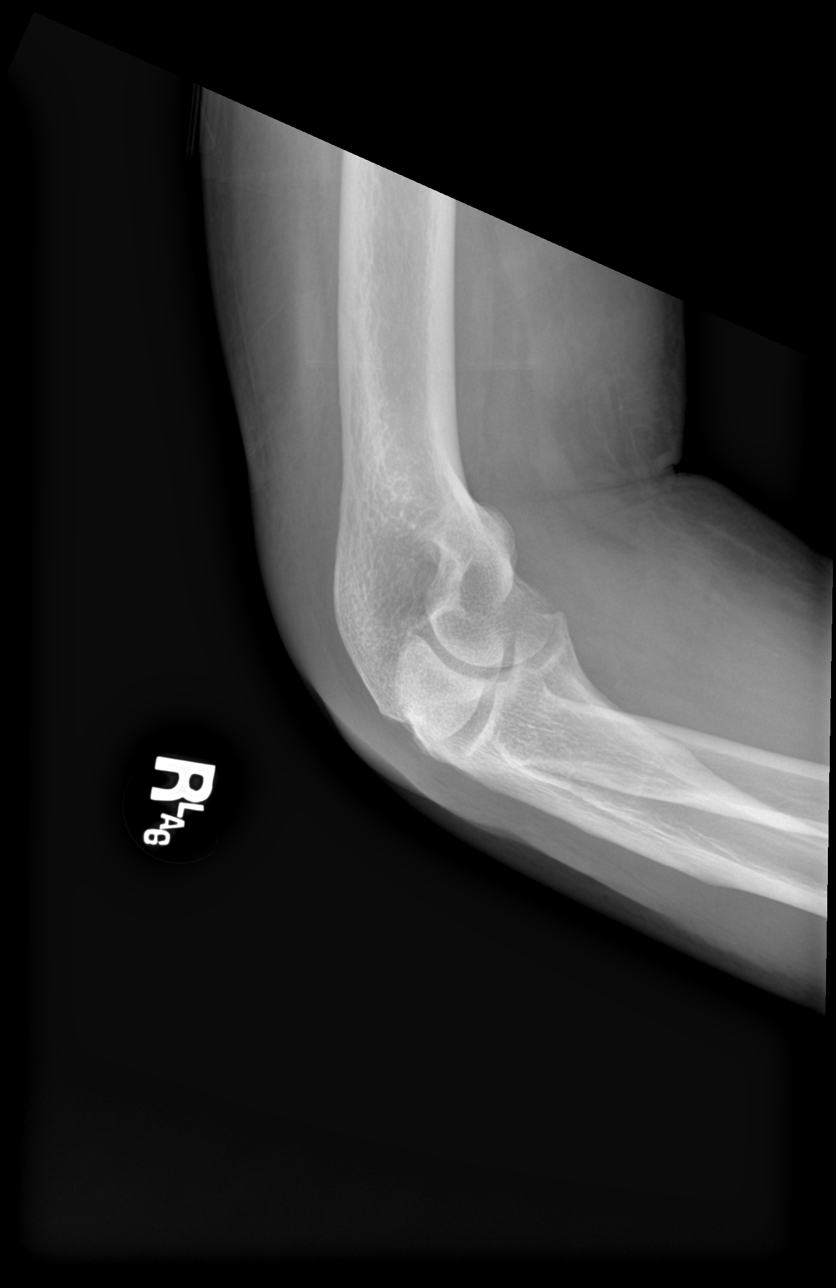

[abdomen kub (4 of 5)]
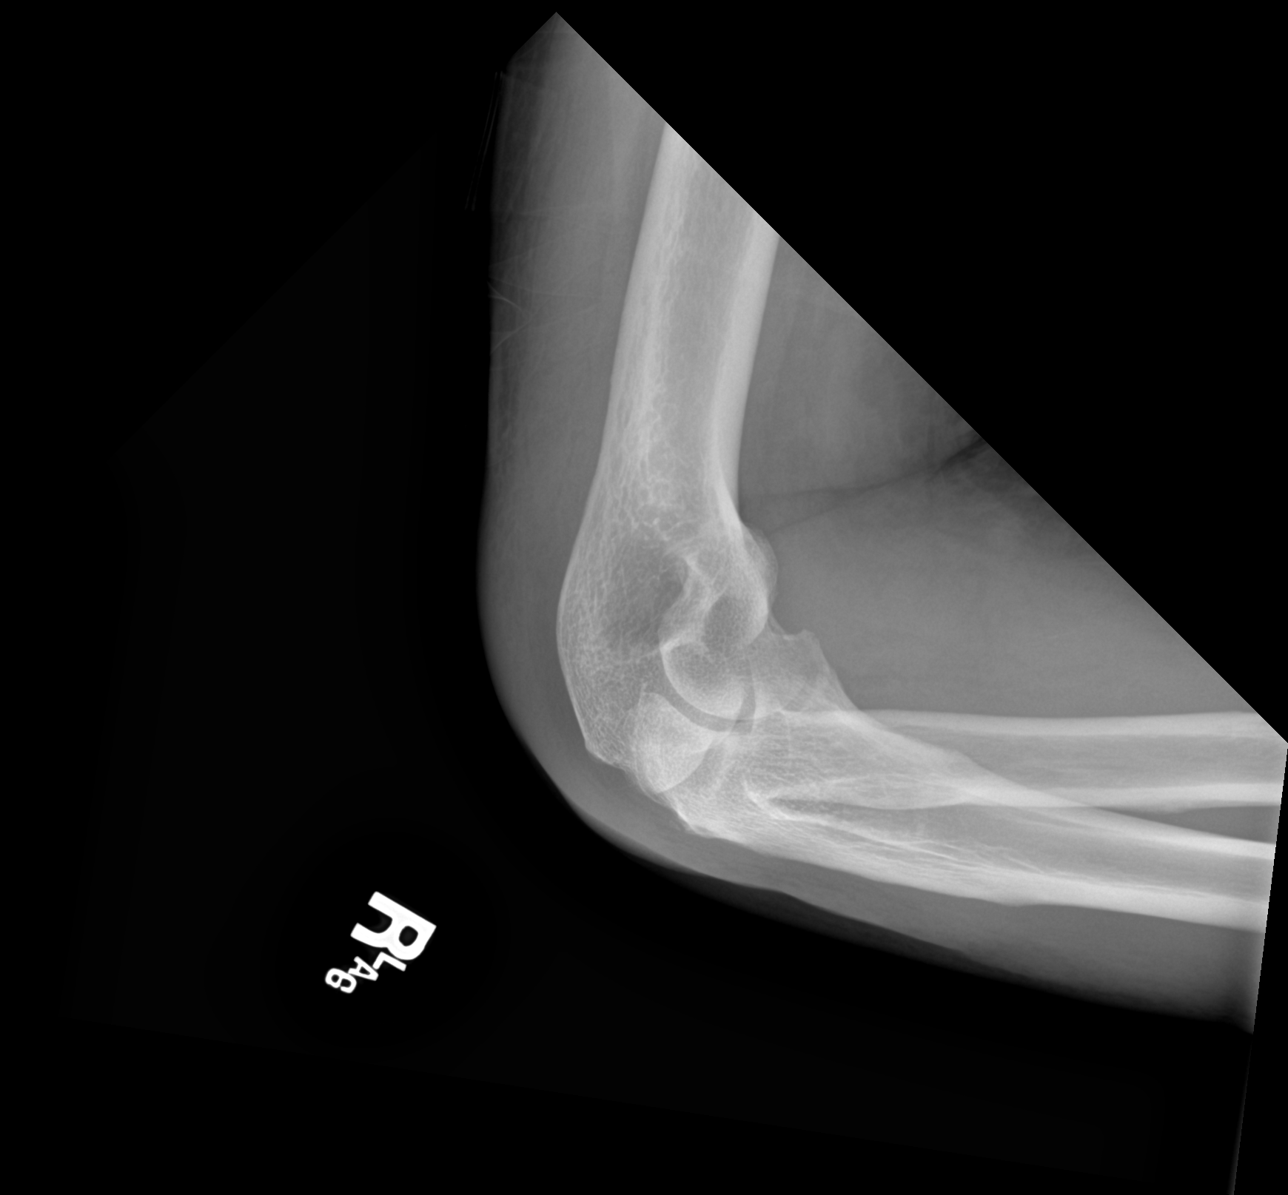

[abdomen kub (5 of 5)]
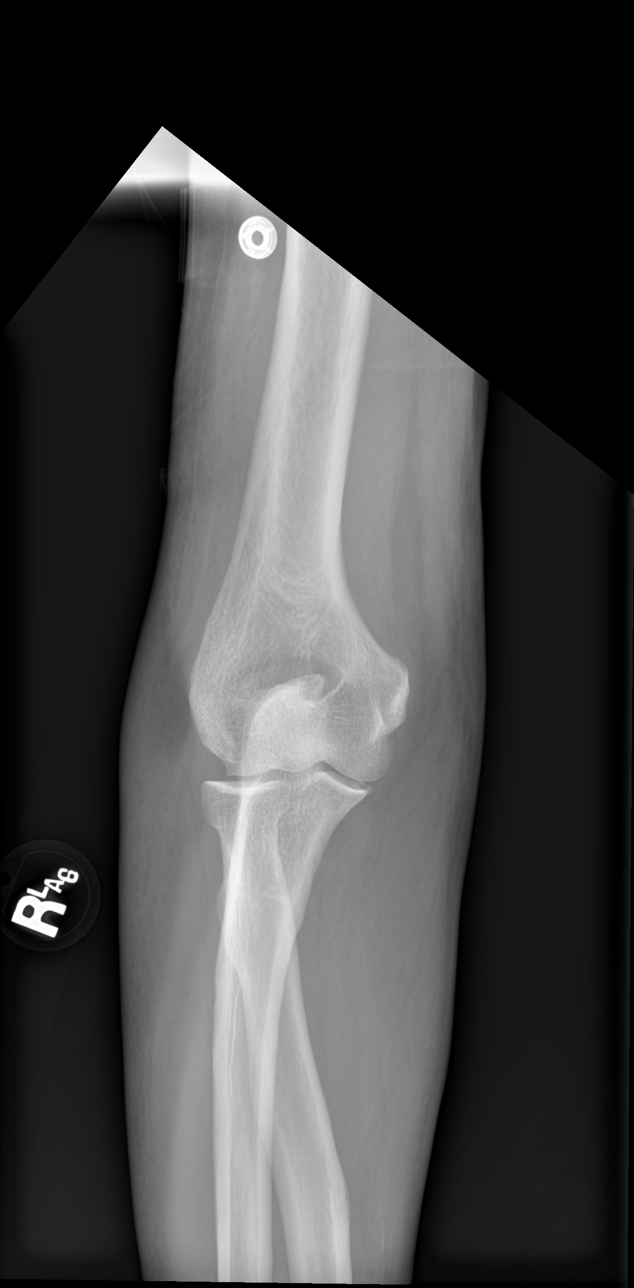

[5 of 5 positions shown; findings below may reference images not displayed]

FINDINGS: The study is limited secondary to difficult patient positioning.
There is no evidence of fracture, dislocation, or joint effusion.
There is no evidence of arthropathy or other focal bone abnormality.
Mild dorsal lateral soft tissue swelling is seen.
IMPRESSION: Mild dorsal lateral soft tissue swelling without evidence of acute
fracture or dislocation.

## 2022-07-26 IMAGING — CR DG HAND COMPLETE 3+V*R*
3 series · 3 of 3 positions shown · non-contrast
Comparison: None.

CLINICAL DATA: Injury from a fall.  Right hand pain.

EXAM:
RIGHT HAND - COMPLETE 3+ VIEW

[x hand pa right]
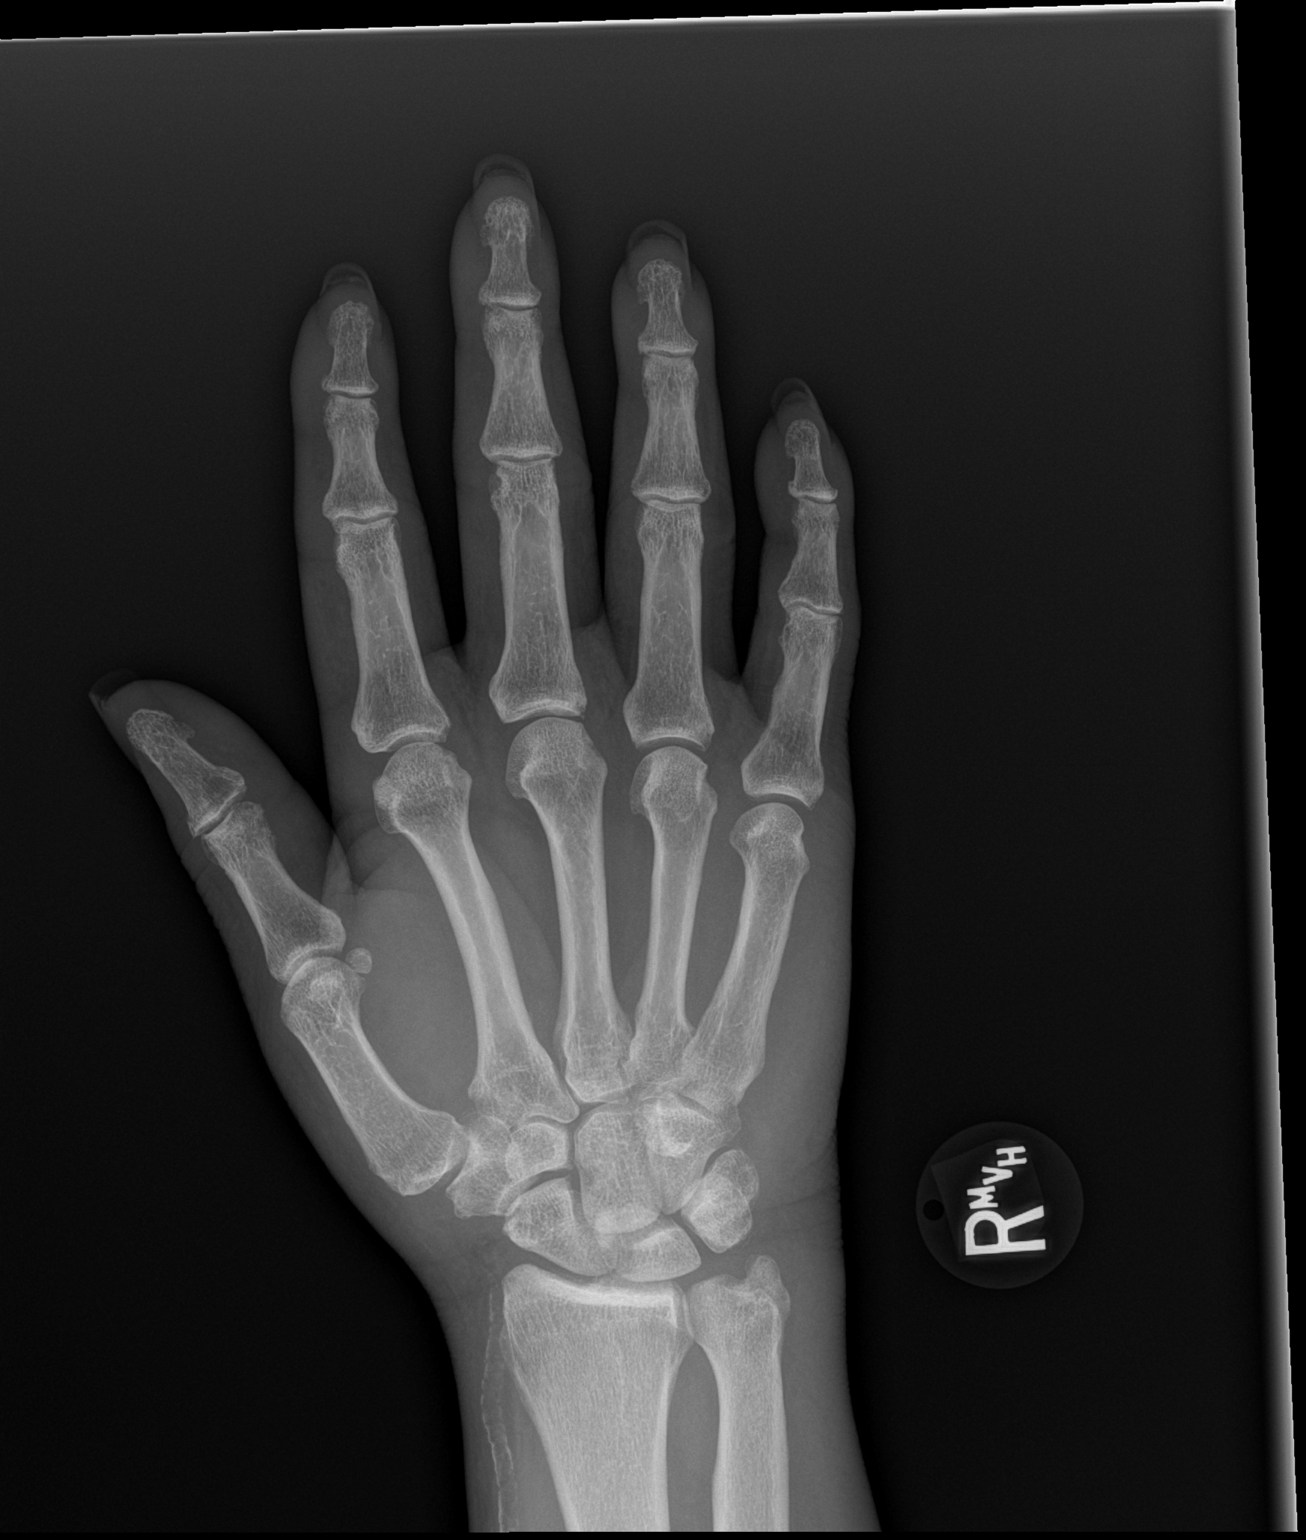

[x hand obl right]
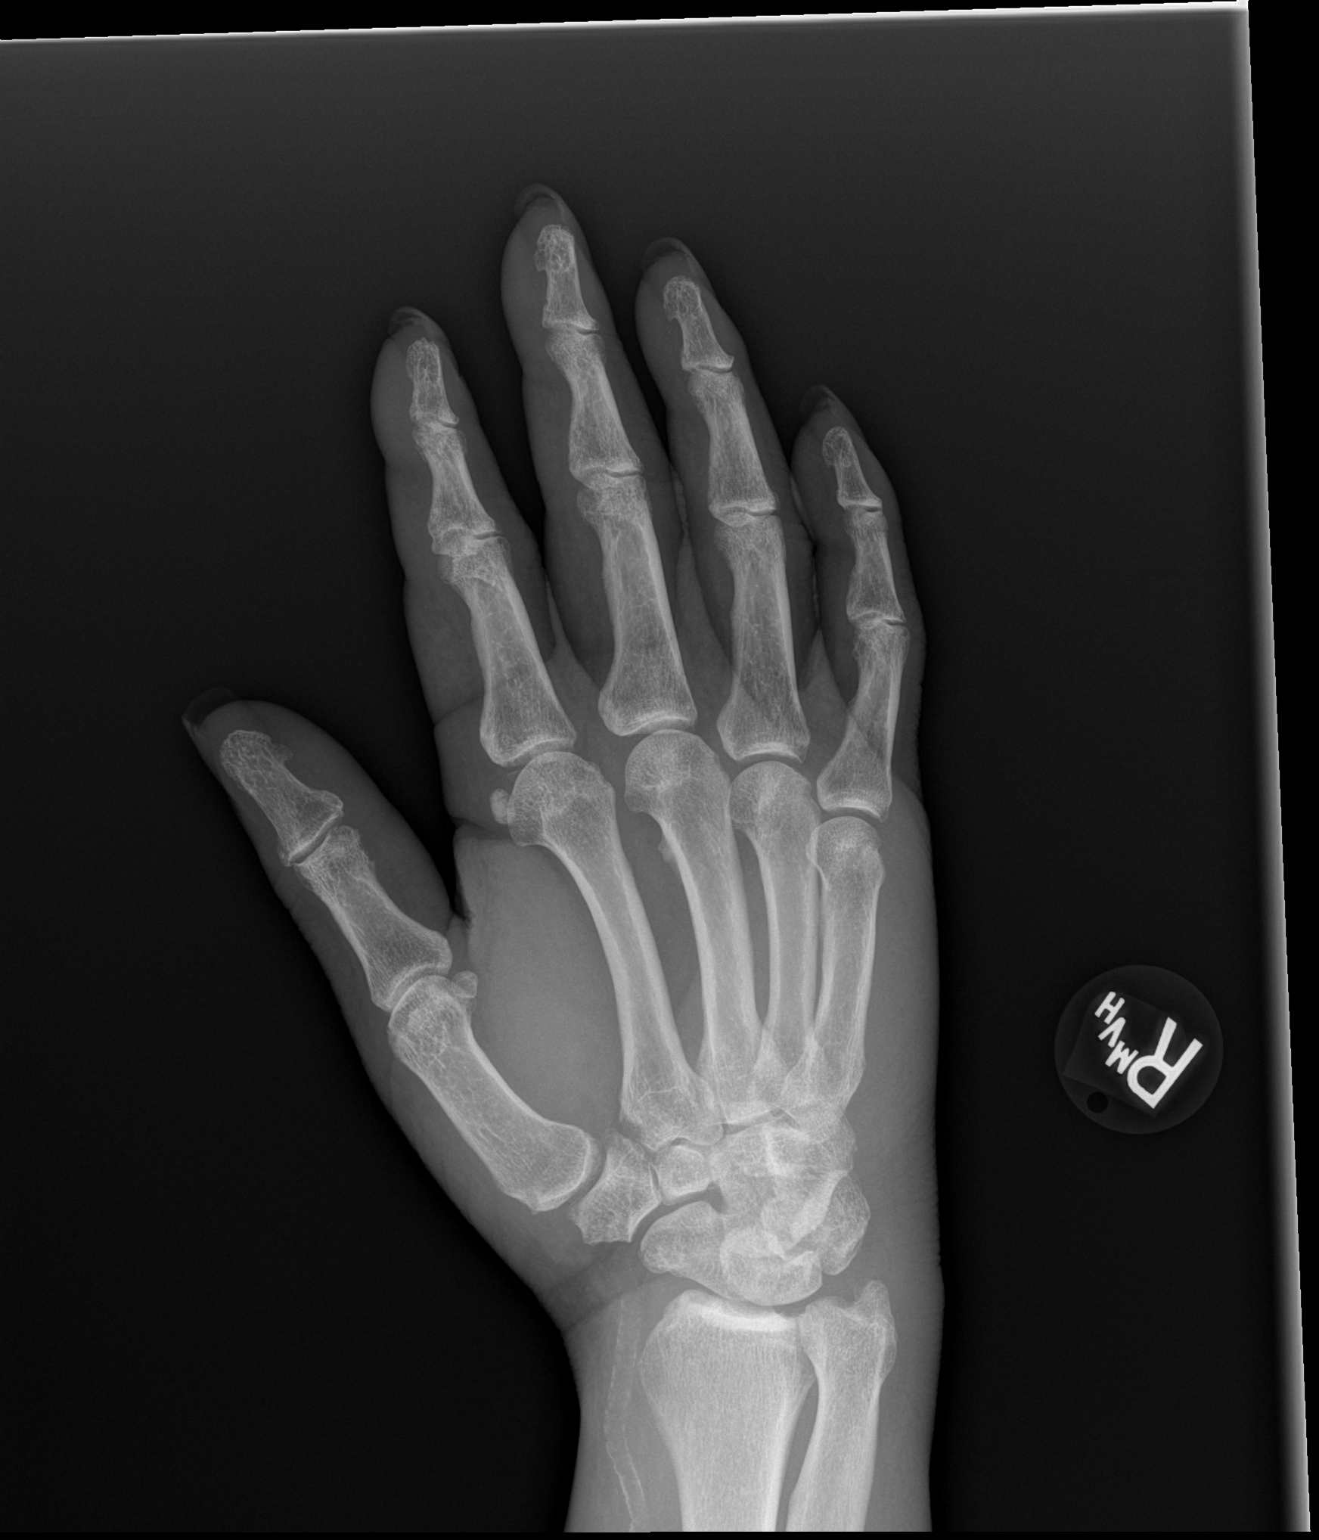

[x hand lat right]
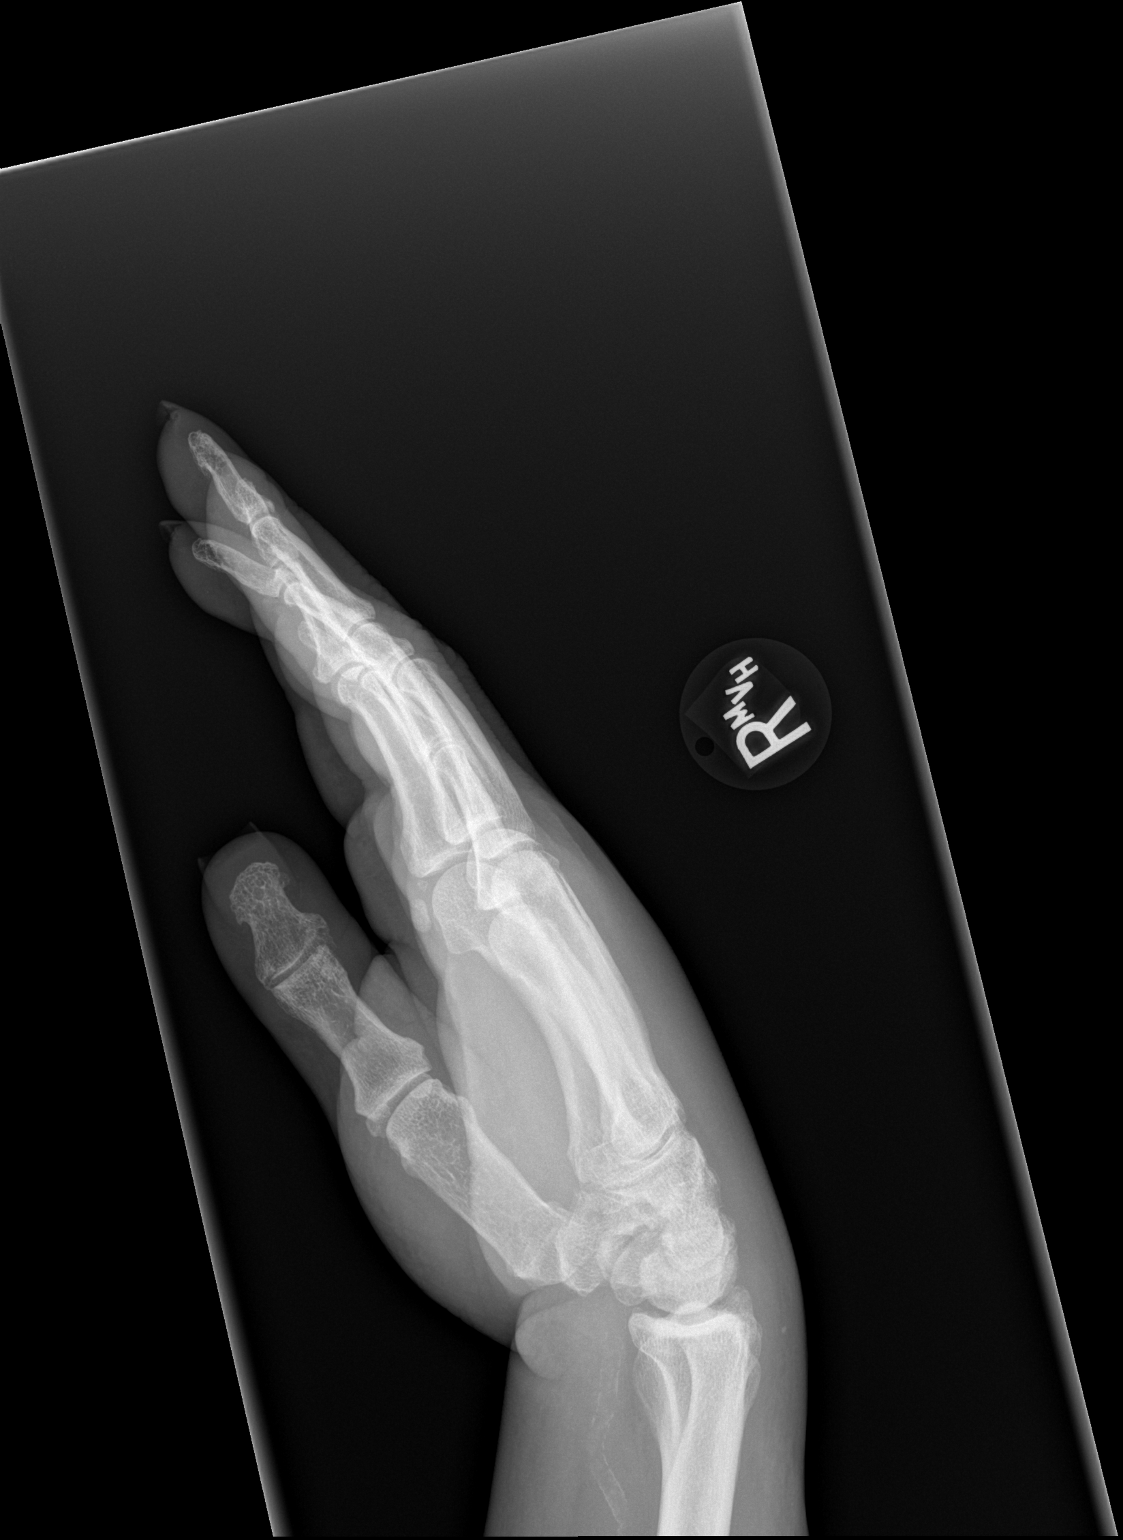

[3 of 3 positions shown; findings below may reference images not displayed]

FINDINGS: Mild degenerative changes in the interphalangeal joints. No evidence
of acute fracture or dislocation. No focal bone lesions. Dorsal soft
tissue swelling over the right hand. No radiopaque foreign bodies.
Vascular calcifications.
IMPRESSION: Degenerative changes.  Soft tissue swelling.  No acute fractures.

## 2022-07-28 IMAGING — CT CT ABD-PELV W/ CM
2 of 5 series · 17 of 46 positions shown, 19 images · IV contrast (omnipaque)
Comparison: CT 08/30/2020, radiograph 08/30/2020, CT 08/18/2020, CT
01/05/2020

CLINICAL DATA: Distension pain

EXAM:
CT ABDOMEN AND PELVIS WITH CONTRAST
TECHNIQUE: Multidetector CT imaging of the abdomen and pelvis was performed
using the standard protocol following bolus administration of
intravenous contrast.
CONTRAST:  80mL OMNIPAQUE IOHEXOL 350 MG/ML SOLN

[Series 2: axial st · axial · 0.98mm/px · z∈[+1294,+1744]mm · 14 of 104 slices shown, 16 images]
[im 7/104  soft-tissue]
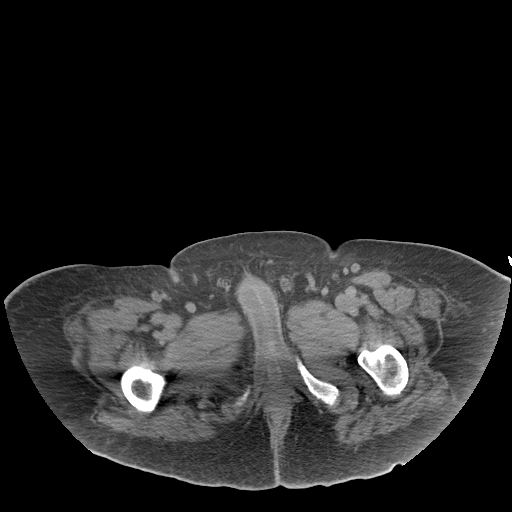
[im 7/104  bone]
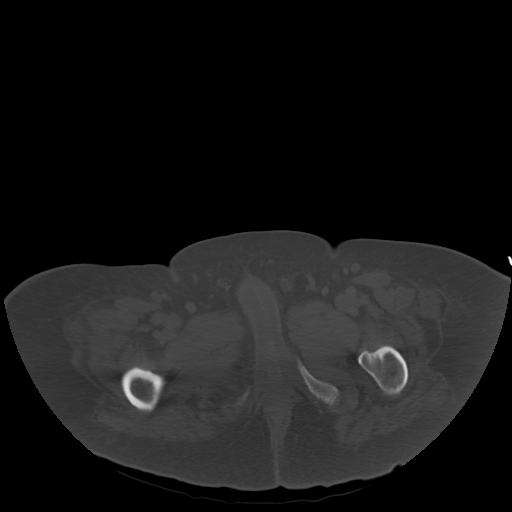
[im 14/104  soft-tissue]
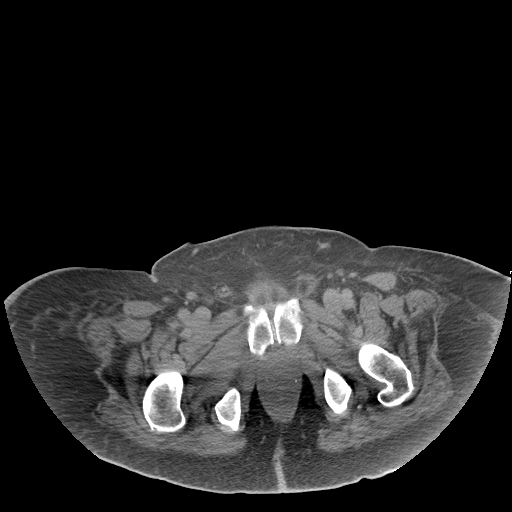
[im 21/104  soft-tissue]
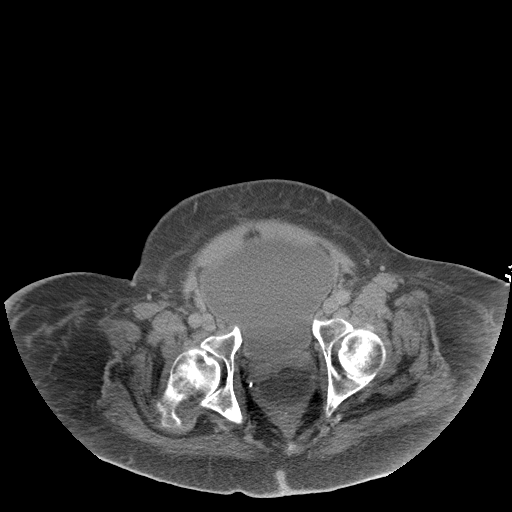
[im 28/104  soft-tissue]
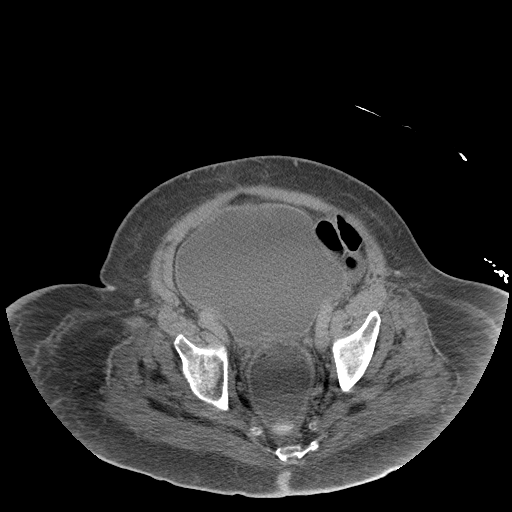
[im 35/104  soft-tissue]
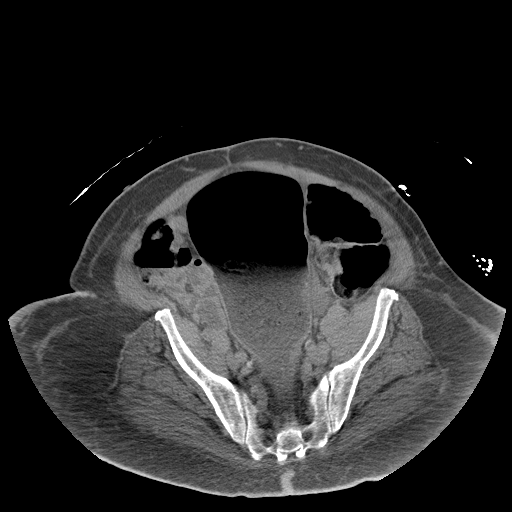
[im 42/104  soft-tissue]
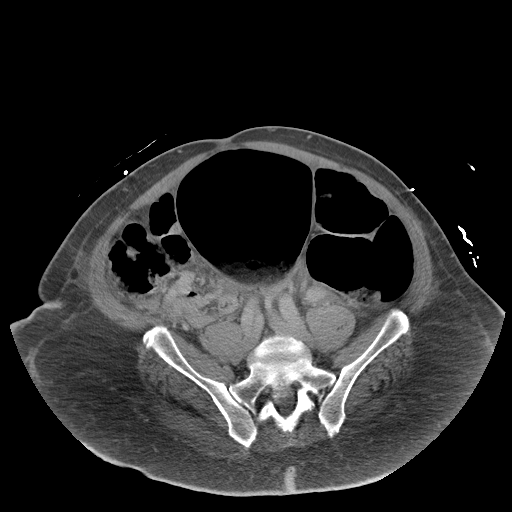
[im 49/104  soft-tissue]
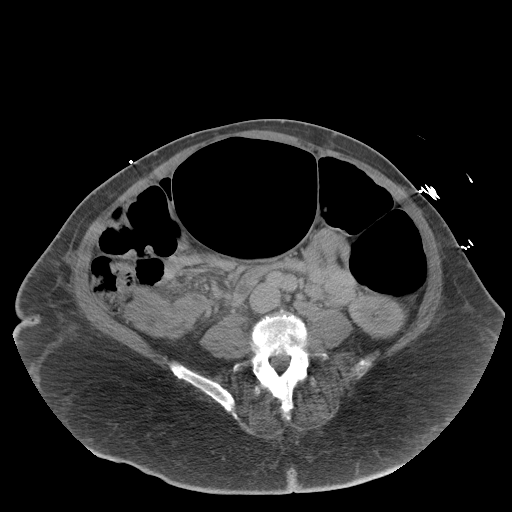
[im 55/104  soft-tissue]
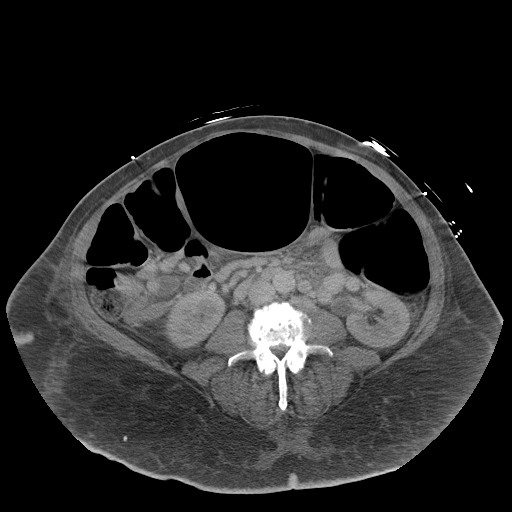
[im 62/104  soft-tissue]
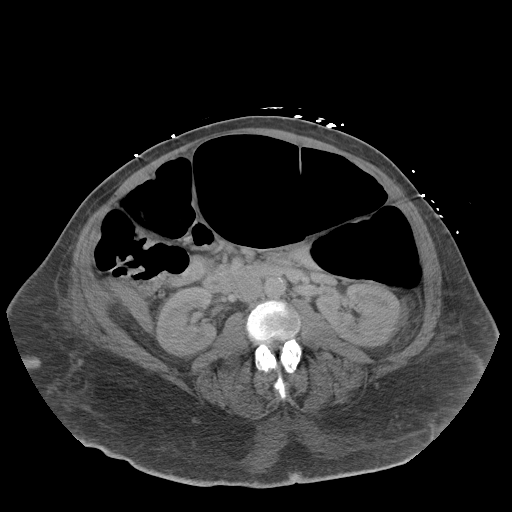
[im 62/104  bone]
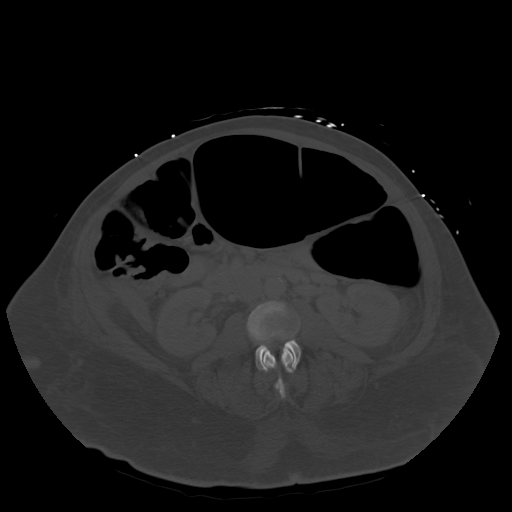
[im 69/104  soft-tissue]
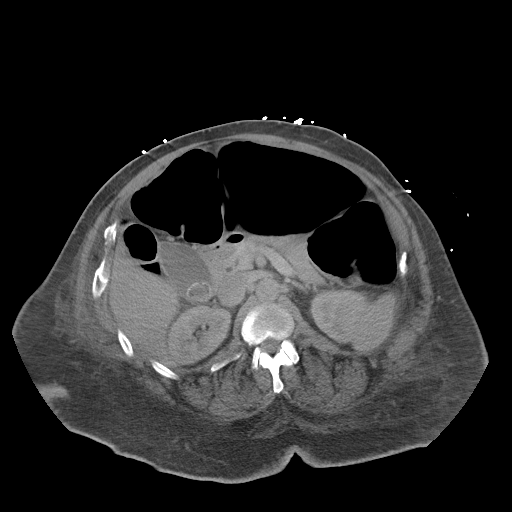
[im 76/104  soft-tissue]
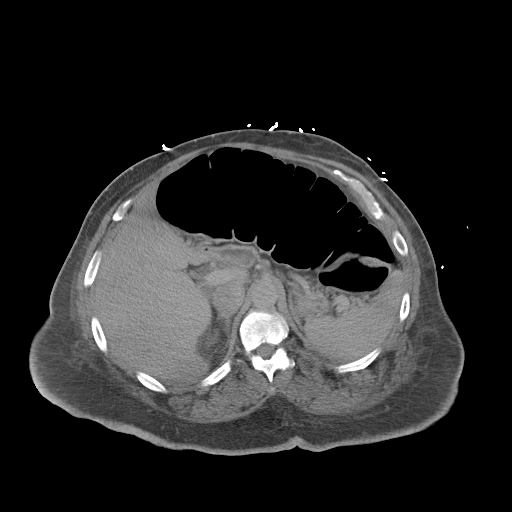
[im 83/104  soft-tissue]
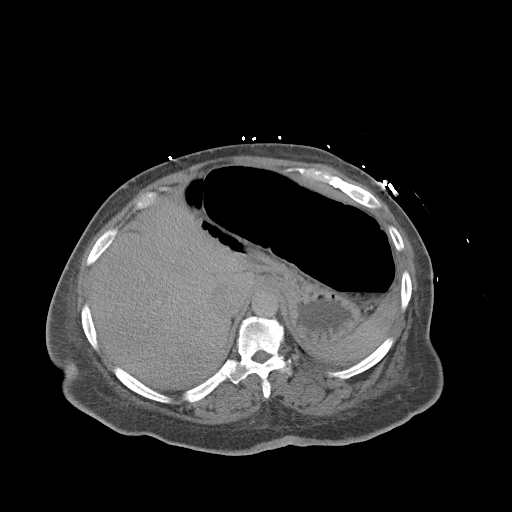
[im 90/104  soft-tissue]
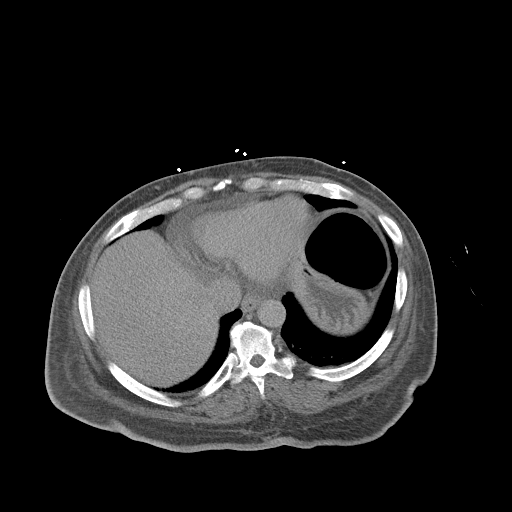
[im 97/104  soft-tissue]
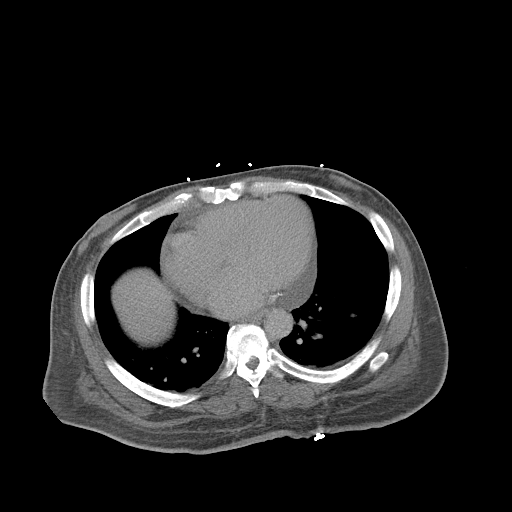

[Series 5: coronal st · coronal · 1.06mm/px · 3 of 210 slices shown]
[im 70/210  soft-tissue]
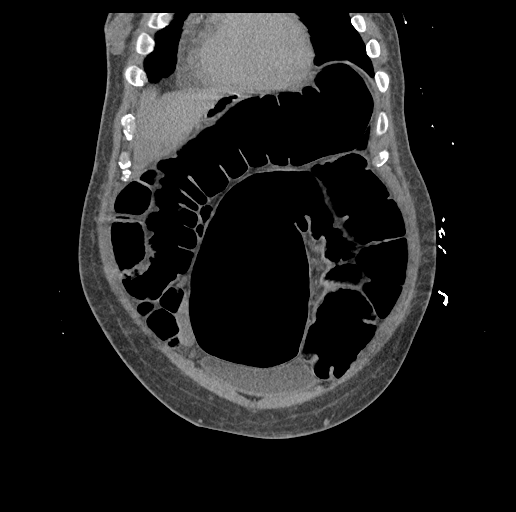
[im 93/210  soft-tissue]
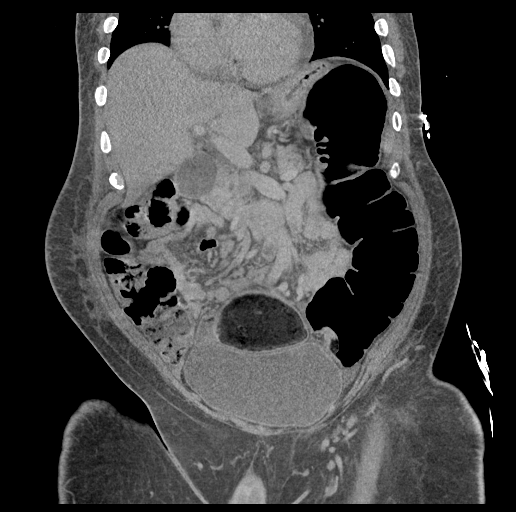
[im 117/210  soft-tissue]
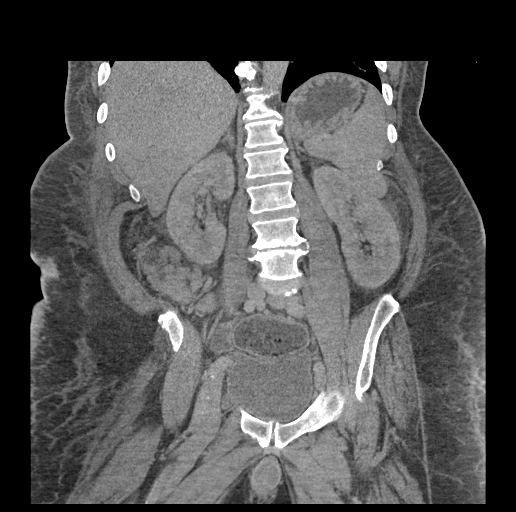

[17 of 46 positions shown; findings below may reference images not displayed]

FINDINGS: Lower chest: Lung bases demonstrate no acute consolidation or
effusion. Small pericardial effusion measuring up to 18 mm in
thickness. Borderline to mild cardiomegaly. Gynecomastia.

Hepatobiliary: Gallstone. No biliary dilatation or focal hepatic
abnormality.

Pancreas: Unremarkable. No pancreatic ductal dilatation or
surrounding inflammatory changes.

Spleen: Normal in size without focal abnormality.

Adrenals/Urinary Tract: Adrenal glands are unremarkable. Kidneys are
normal, without renal calculi, focal lesion, or hydronephrosis.
Bladder is unremarkable.

Stomach/Bowel: The stomach is nonenlarged. No dilated small bowel.
Considerable diffuse air distension of the colon without acute wall
thickening. No definite mesenteric twisting to suggest volvulus.
Moderate stool in the rectum

Vascular/Lymphatic: Mild aortic atherosclerosis. No aneurysm. No
suspicious nodes

Reproductive: Prostate is unremarkable.

Other: Negative for free air or effusion

Musculoskeletal: No acute or suspicious osseous abnormality.
IMPRESSION: 1. Considerable diffuse air distension of the colon but without
evidence for volvulus, bowel wall thickening, or obstruction.
2. Gallstones

## 2022-07-28 IMAGING — DX DG CHEST 1V
1 series · 1 of 1 positions shown · non-contrast
Comparison: Chest radiograph head CT 10/24/2020.

CLINICAL DATA: Shortness of breath.  Abdominal pain and distension.

EXAM:
CHEST  1 VIEW

[chest ap]
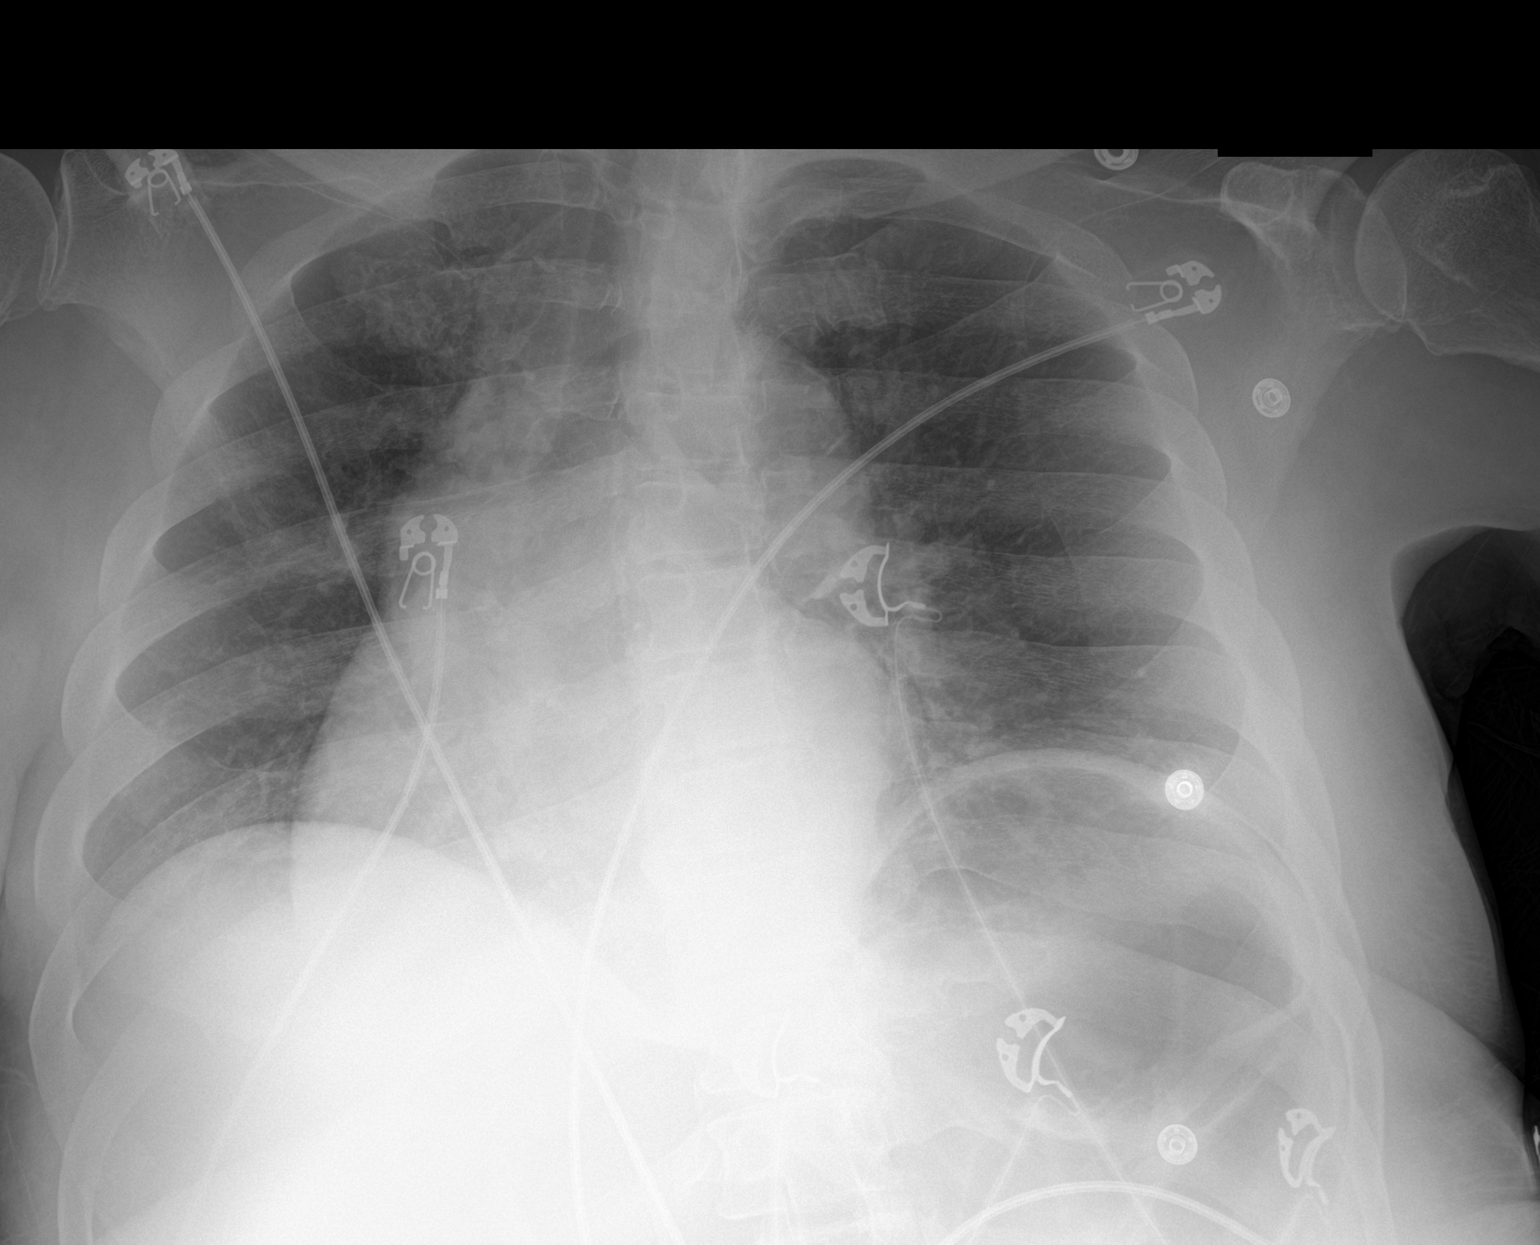

[1 of 1 positions shown; findings below may reference images not displayed]

FINDINGS: Patient is rotated. Stable cardiomegaly. Stable aortic tortuosity
and atherosclerosis. No acute airspace disease, pleural effusion, or
pneumothorax. Gaseous distention of stomach in the left upper
quadrant is similar to prior exam. No acute osseous abnormalities
are seen.
IMPRESSION: No acute findings. Stable low lung volumes with cardiomegaly and
aortic tortuosity.

## 2022-07-29 IMAGING — CR DG ABDOMEN ACUTE W/ 1V CHEST
4 series · 4 of 4 positions shown · non-contrast
Comparison: CT 11/17/2020, 08/30/2020

CLINICAL DATA: Ileus

EXAM:
DG ABDOMEN ACUTE WITH 1 VIEW CHEST

[x chest ap]
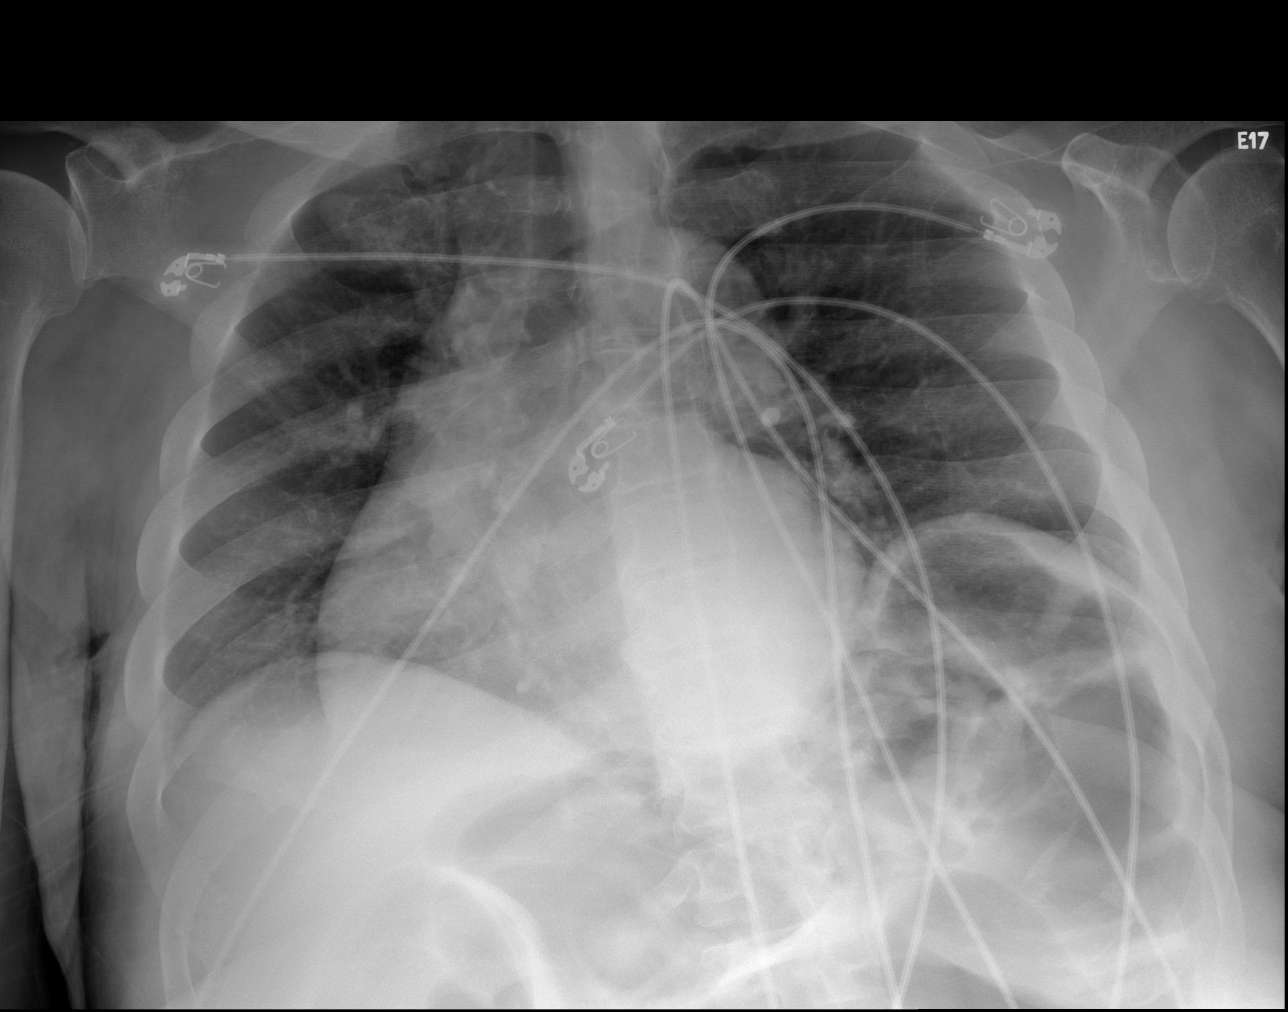

[x abdomen supine (1 of 3)]
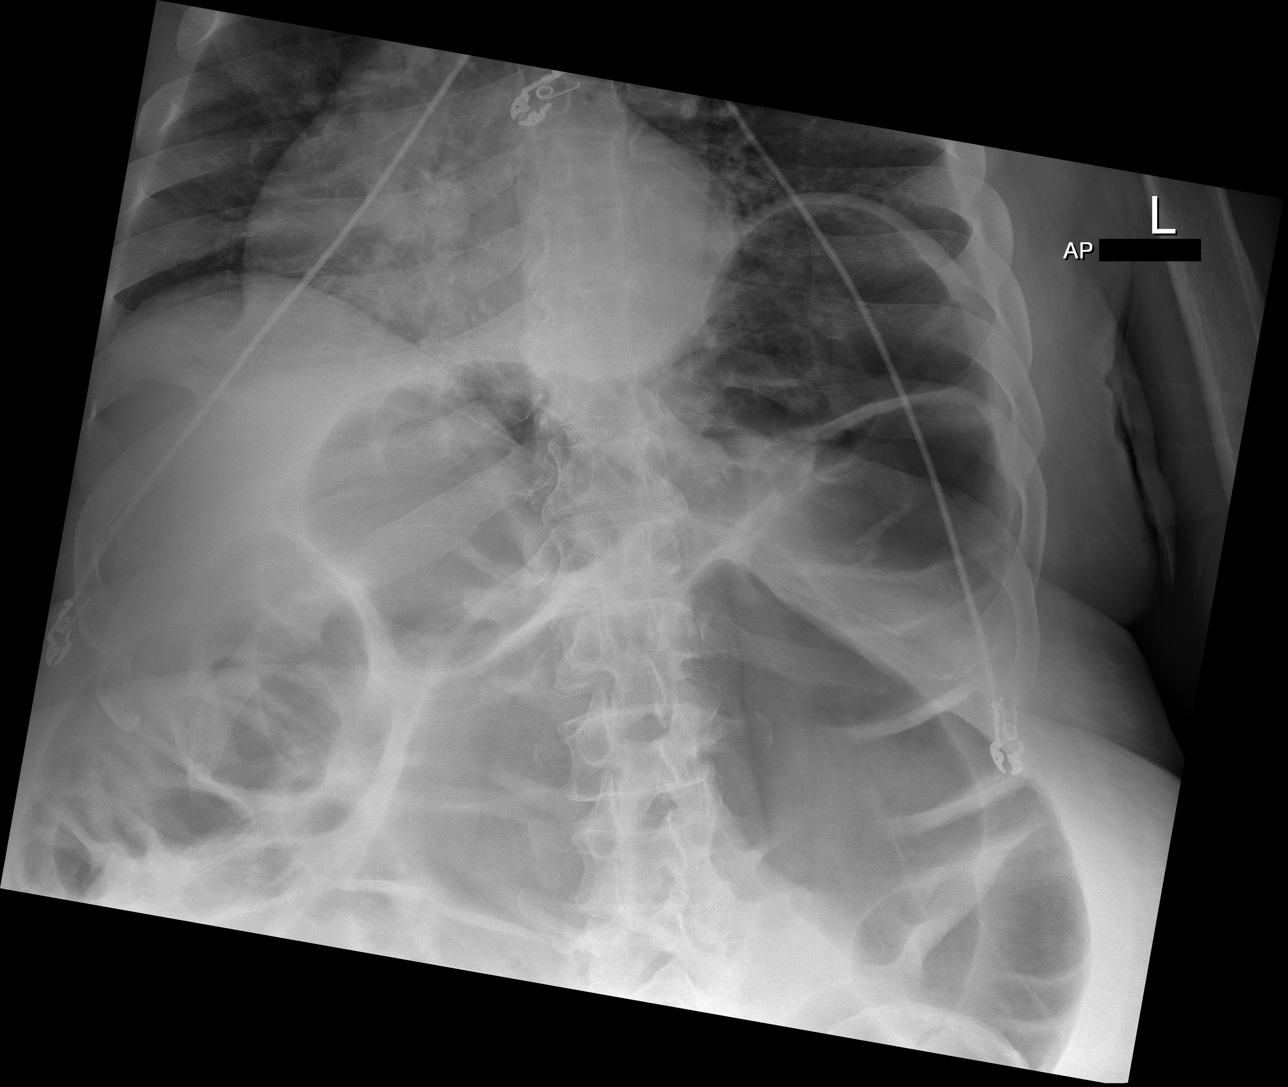

[x abdomen supine (2 of 3)]
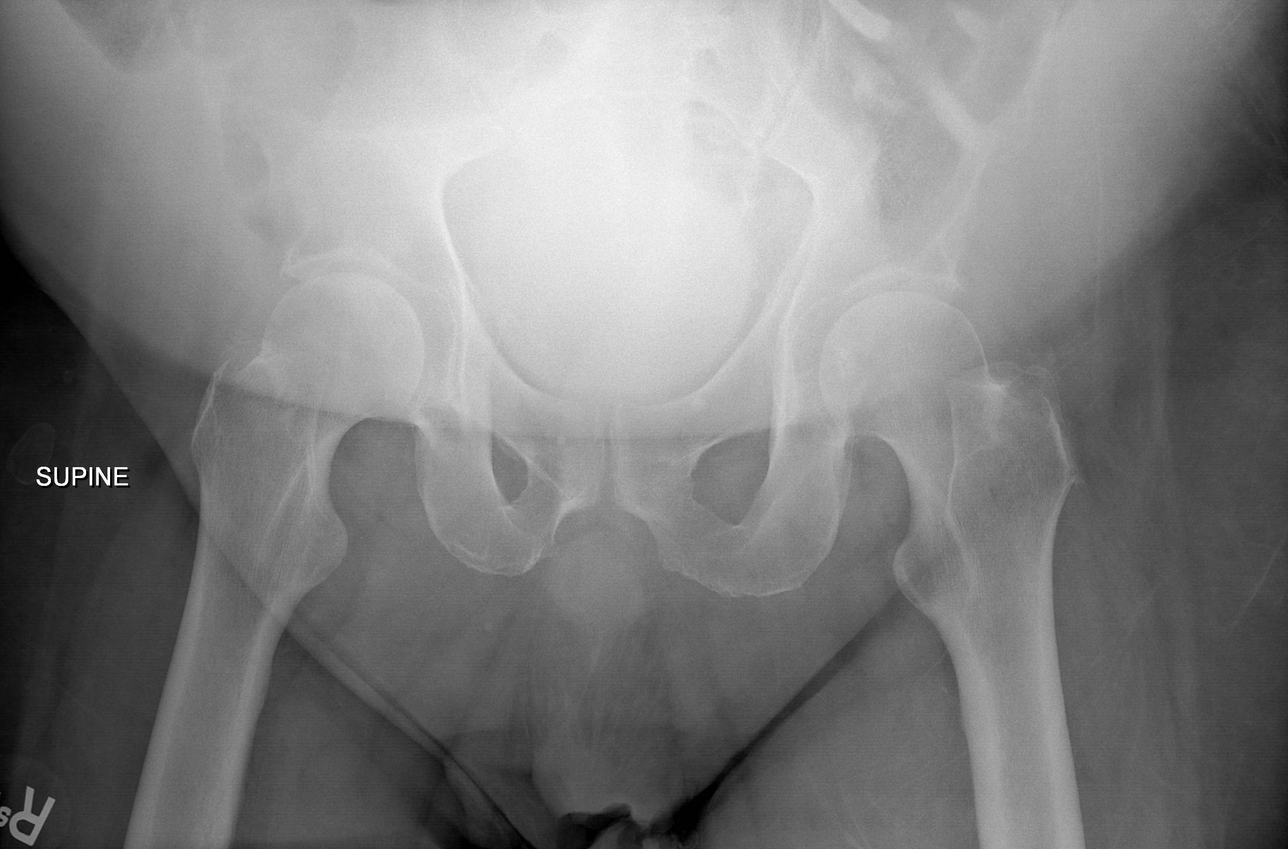

[x abdomen supine (3 of 3)]
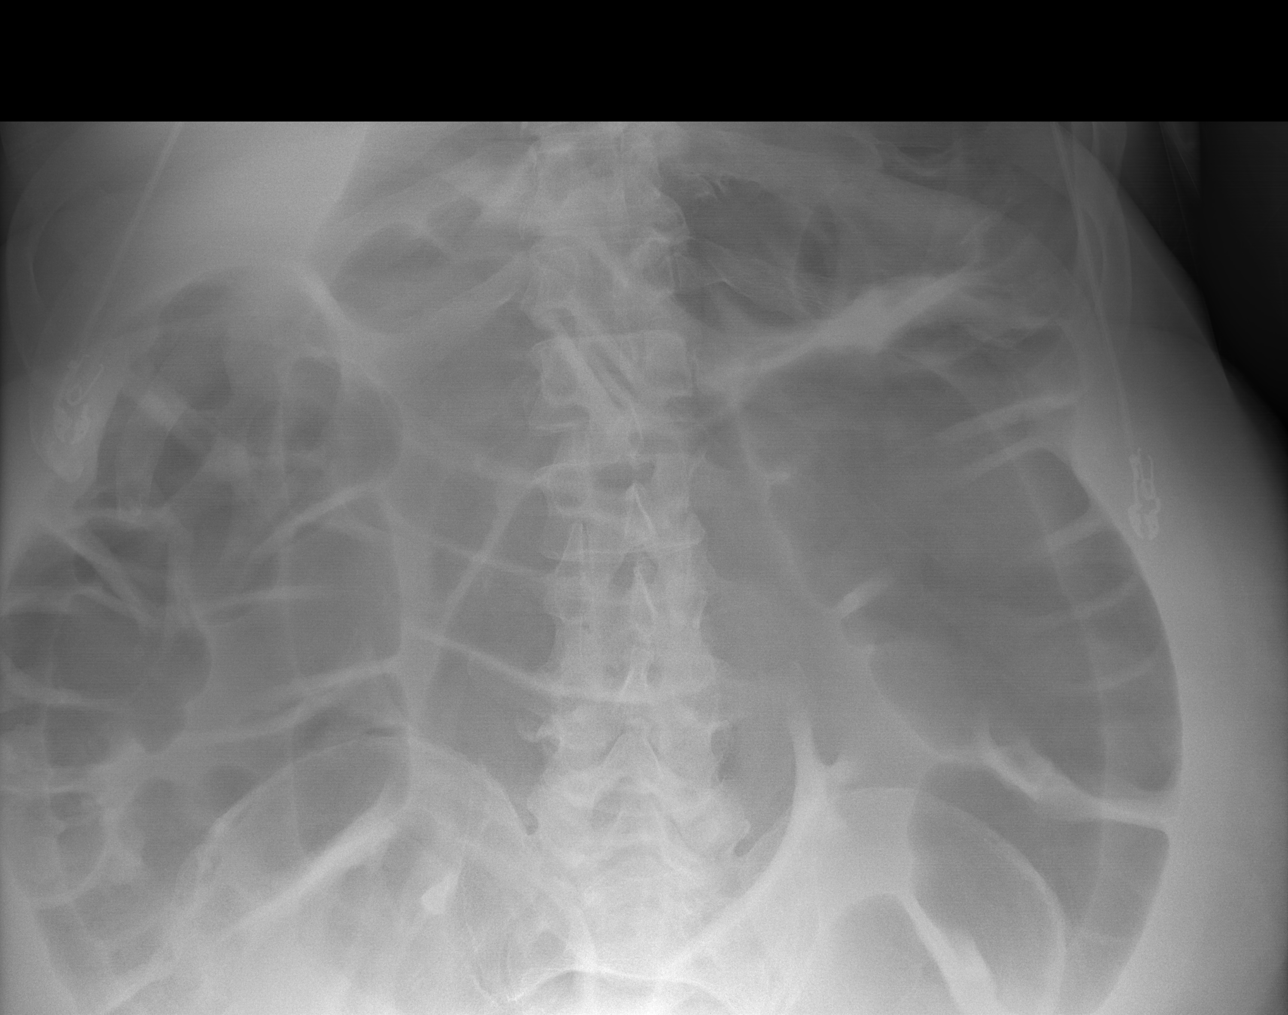

[4 of 4 positions shown; findings below may reference images not displayed]

FINDINGS: Single-view chest demonstrates low lung volumes. No focal opacity or
pleural effusion. Stable cardiomegaly. No pneumothorax.

Supine and upright views of the abdomen demonstrate no free air
beneath the diaphragm. No significant change in moderate to marked
diffuse air distension of the colon. Rectosigmoid colon distension
up to 12.4 cm. Excreted contrast in the urinary bladder.
IMPRESSION: 1. Low lung volumes.  Cardiomegaly.
2. No significant change in moderate to marked diffuse air
distension of the colon with colon distension up to 12.4 cm.

## 2022-07-30 IMAGING — DX DG ABDOMEN 2V
2 series · 2 of 2 positions shown · non-contrast
Comparison: 11/18/2020

CLINICAL DATA: Follow-up rectosigmoid distention.

EXAM:
ABDOMEN - 2 VIEW

[abdomen erect]
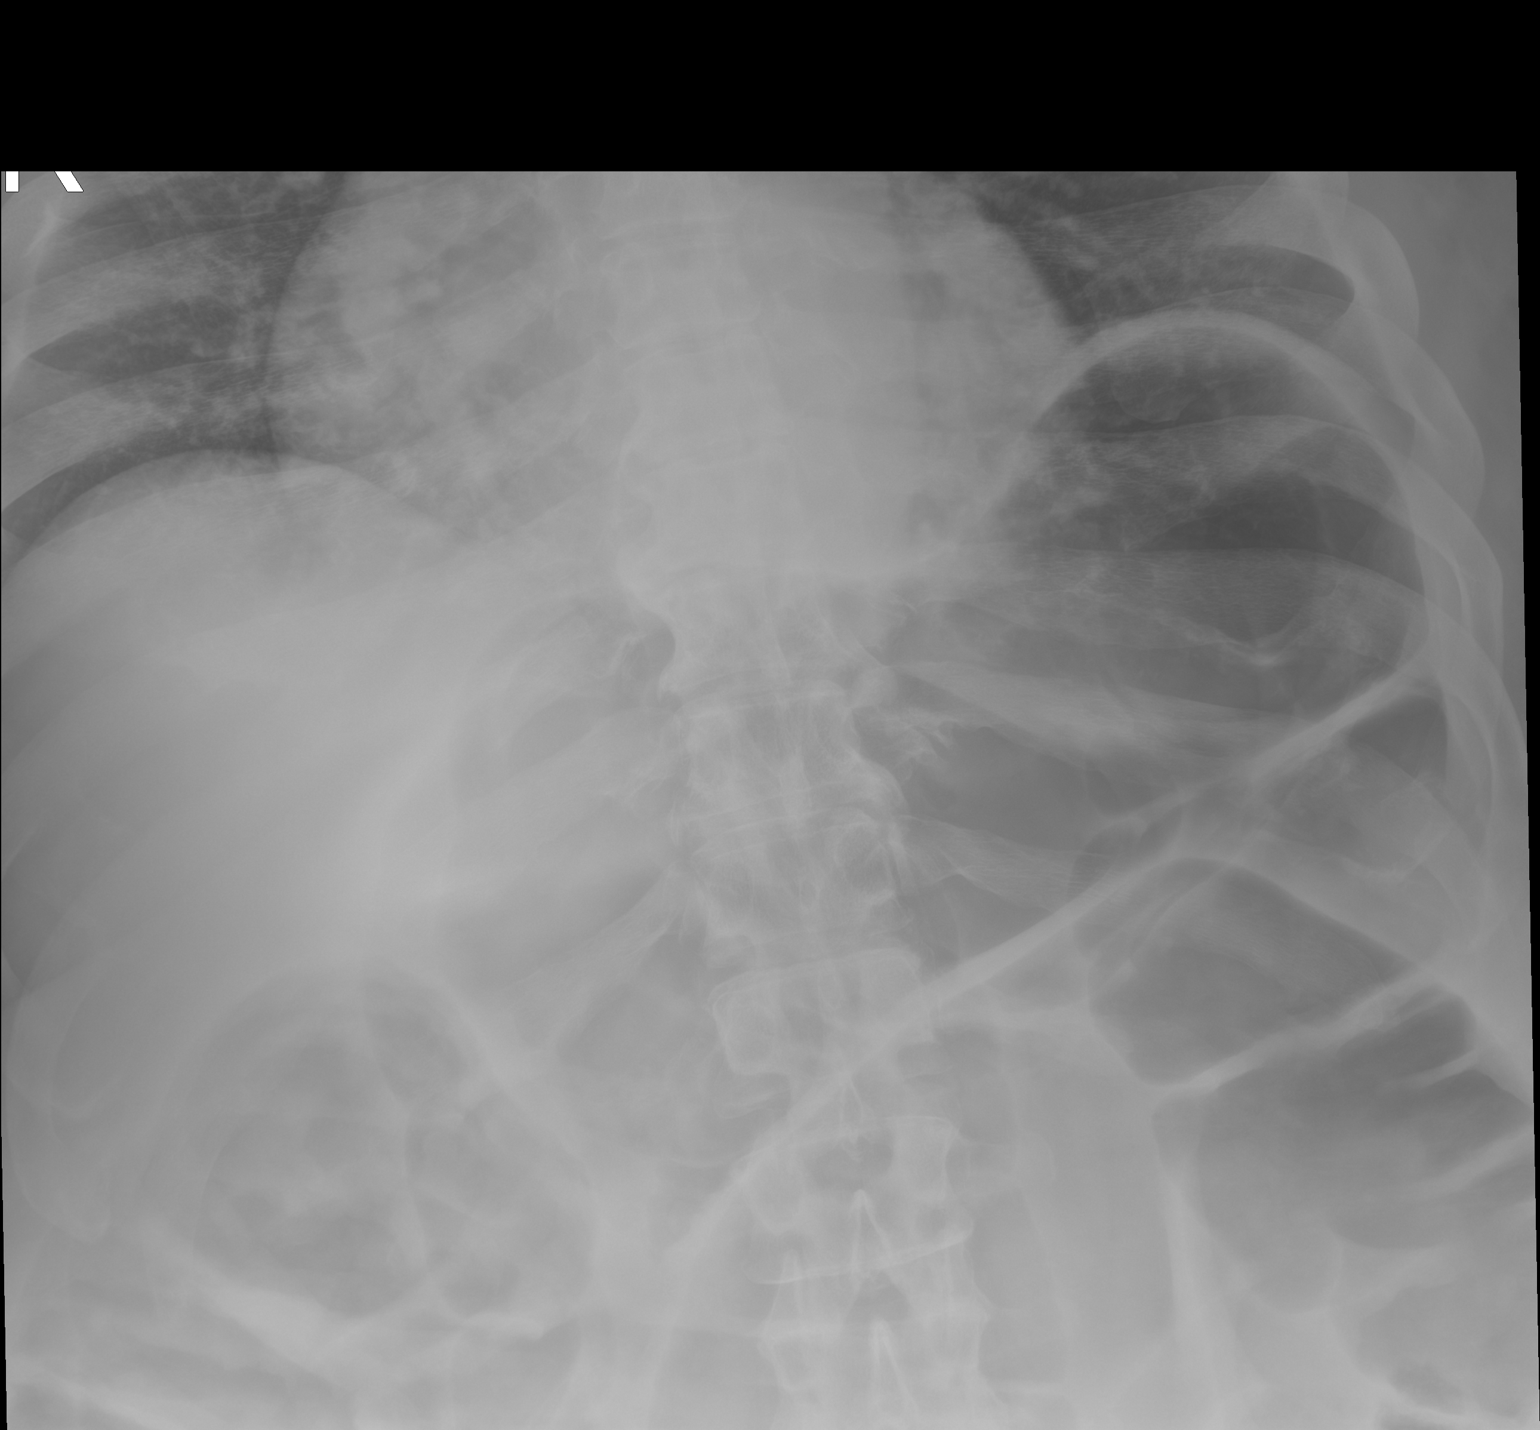

[abdomen supine]
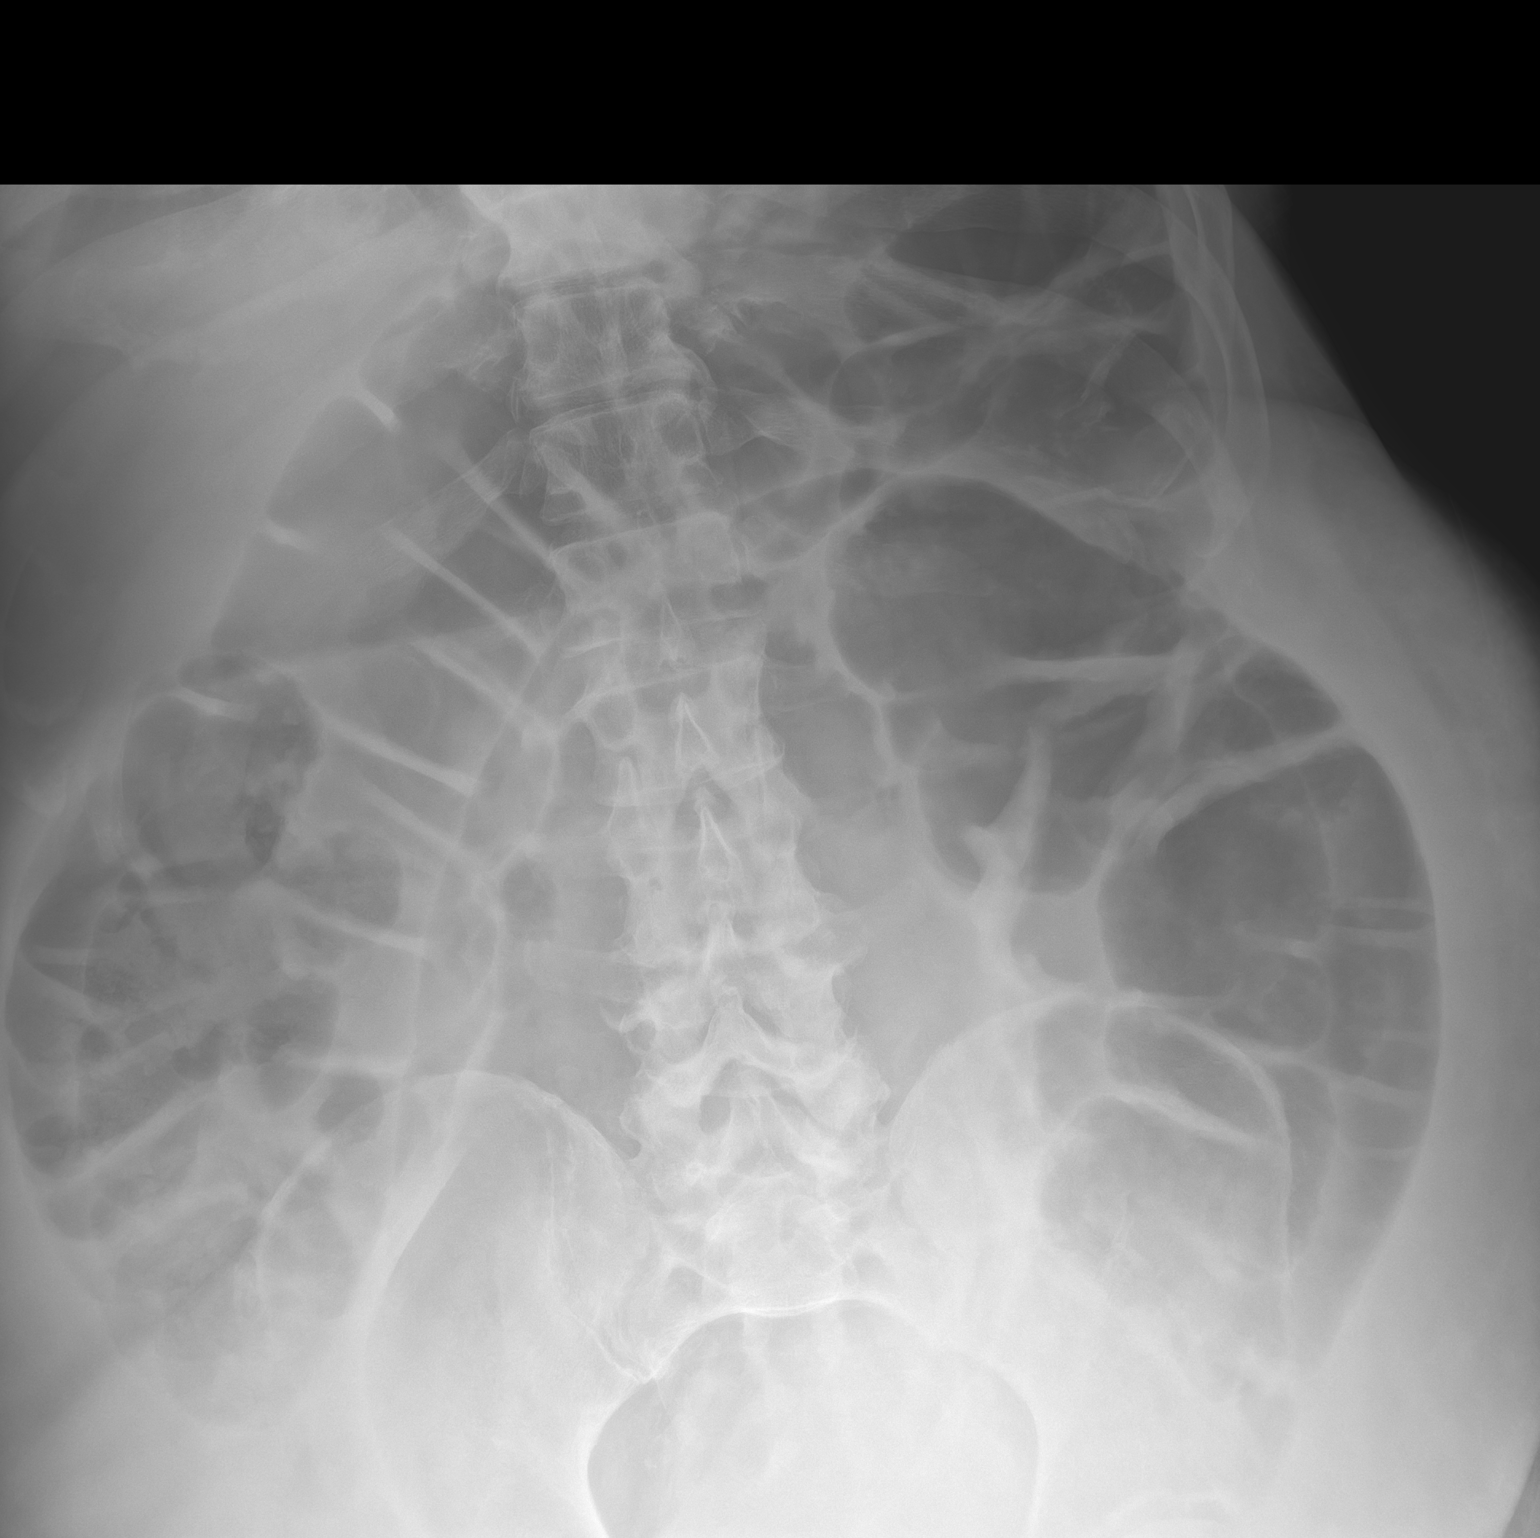

[2 of 2 positions shown; findings below may reference images not displayed]

FINDINGS: Persistent gaseous distension of the colon. This appears unchanged
from previous exam. No significant small bowel dilatation
identified.
IMPRESSION: No change in gaseous distension of the colon

## 2022-08-01 IMAGING — DX DG ABD PORTABLE 1V
1 series · 1 of 1 positions shown · non-contrast
Comparison: 11/19/2020

CLINICAL DATA: Nasogastric tube placement

EXAM:
PORTABLE ABDOMEN - 1 VIEW

[abdomen kub]
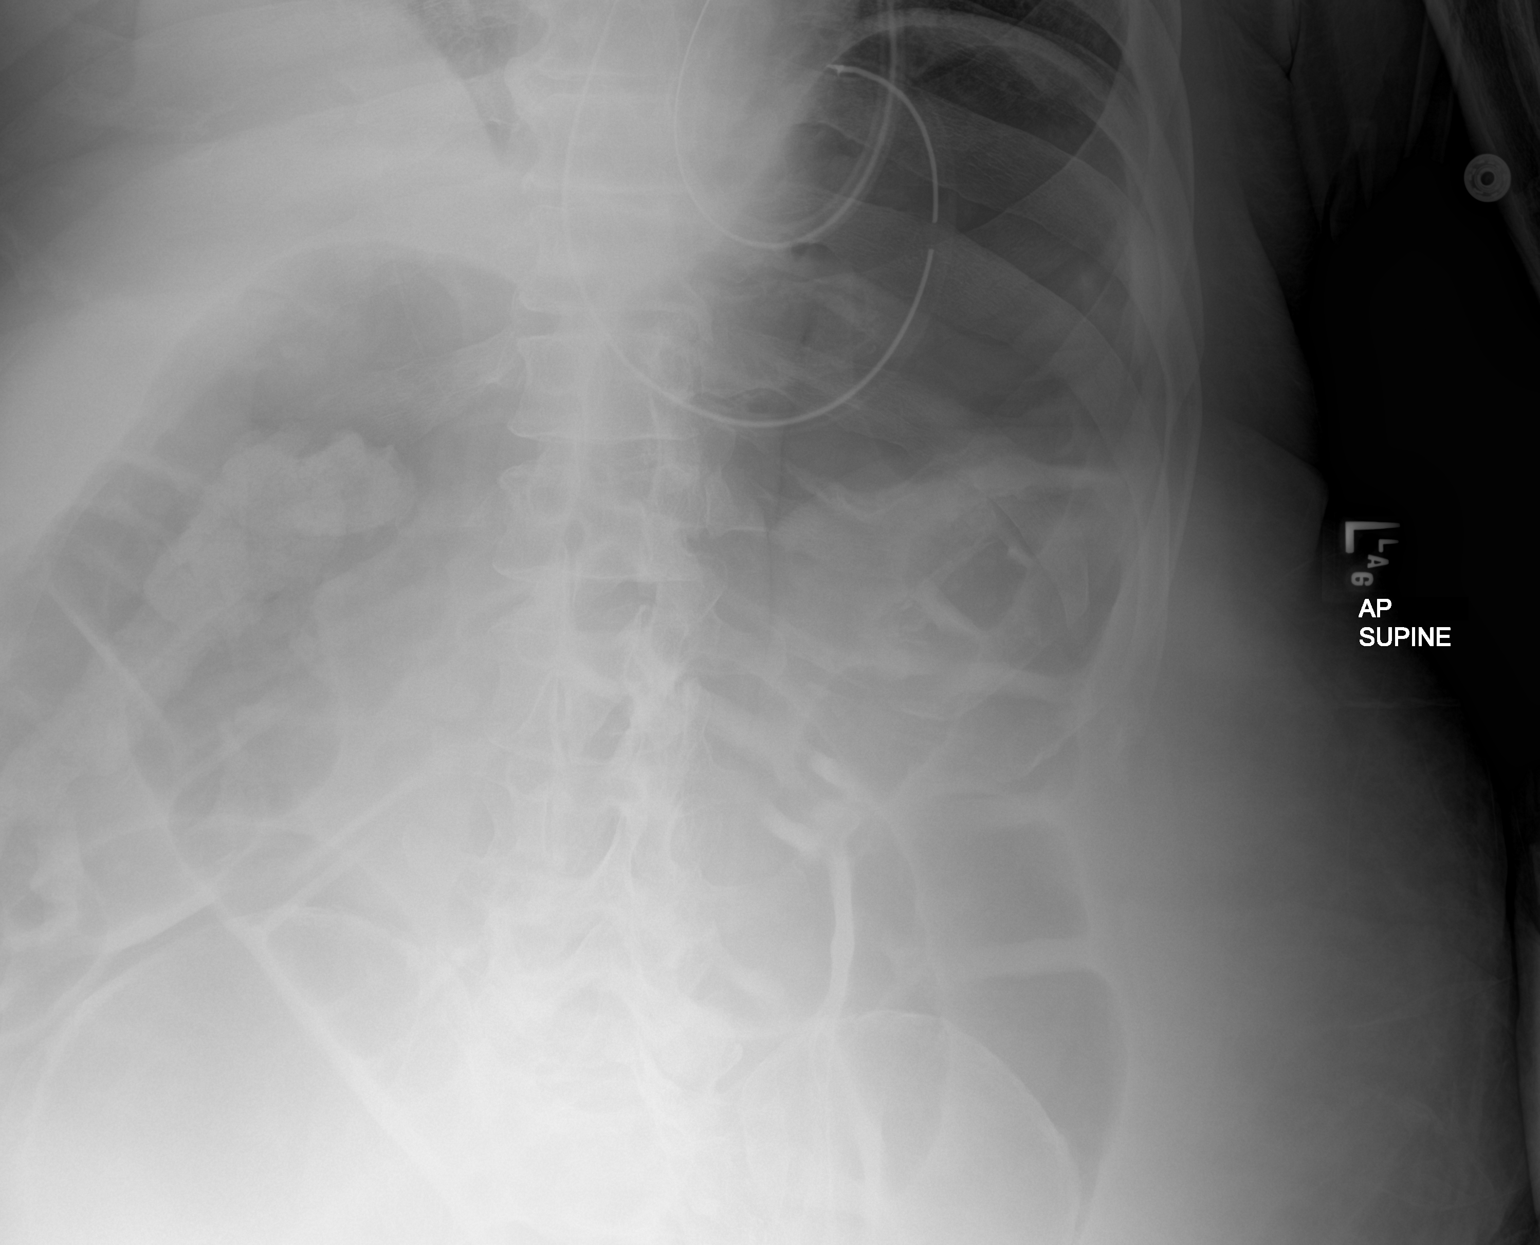

[1 of 1 positions shown; findings below may reference images not displayed]

FINDINGS: Right flank and pelvis are excluded from view. The visualized
abdominal gas pattern is nonobstructive. Nasogastric tube is seen
with its tip position within the gastric fundus.
IMPRESSION: Nasogastric tube tip within the gastric fundus.

## 2022-08-02 IMAGING — DX DG ABD PORTABLE 1V
1 series · 1 of 1 positions shown · non-contrast
Comparison: 11/21/2020

CLINICAL DATA: Abdominal distension.  Known Lakshman syndrome.

EXAM:
PORTABLE ABDOMEN - 1 VIEW

[abdomen erect]
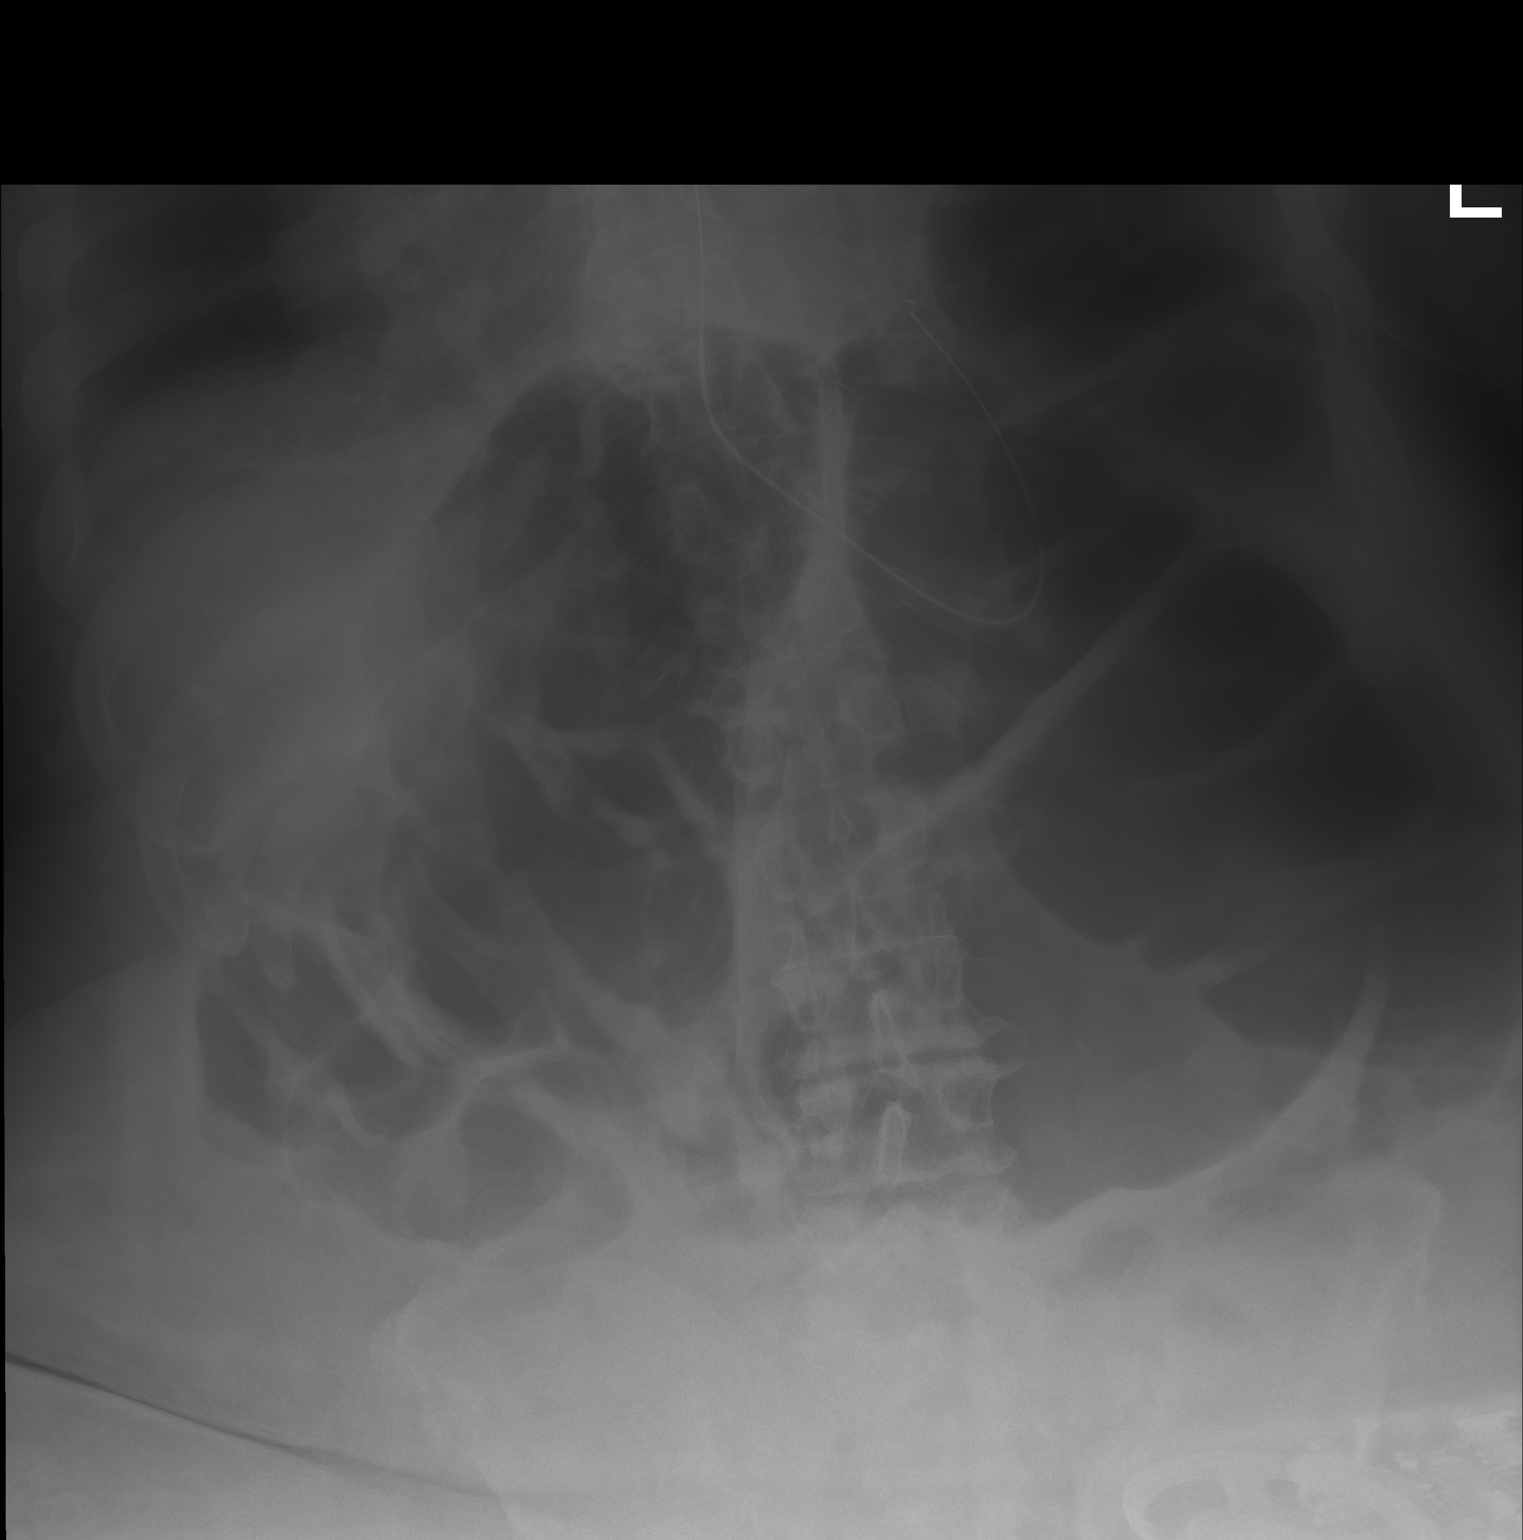

[1 of 1 positions shown; findings below may reference images not displayed]

FINDINGS: Gaseous distention of the colon appears similar to prior radiographs
and may be slightly more prominent compared to the prior x-ray.
Findings are similar to a prior CT on 11/17/2020. No gross signs of
free air. Nasogastric tube again demonstrated extending into the
proximal stomach.
IMPRESSION: No significant change in appearance of gaseous distension of the
colon consistent with Lakshman syndrome (colonic pseudo-obstruction).

## 2022-08-04 IMAGING — DX DG ABD PORTABLE 1V
2 series · 2 of 2 positions shown · non-contrast
Comparison: 11/22/2020

CLINICAL DATA: Abdominal distention

EXAM:
PORTABLE ABDOMEN - 1 VIEW

[abdomen kub (1 of 2)]
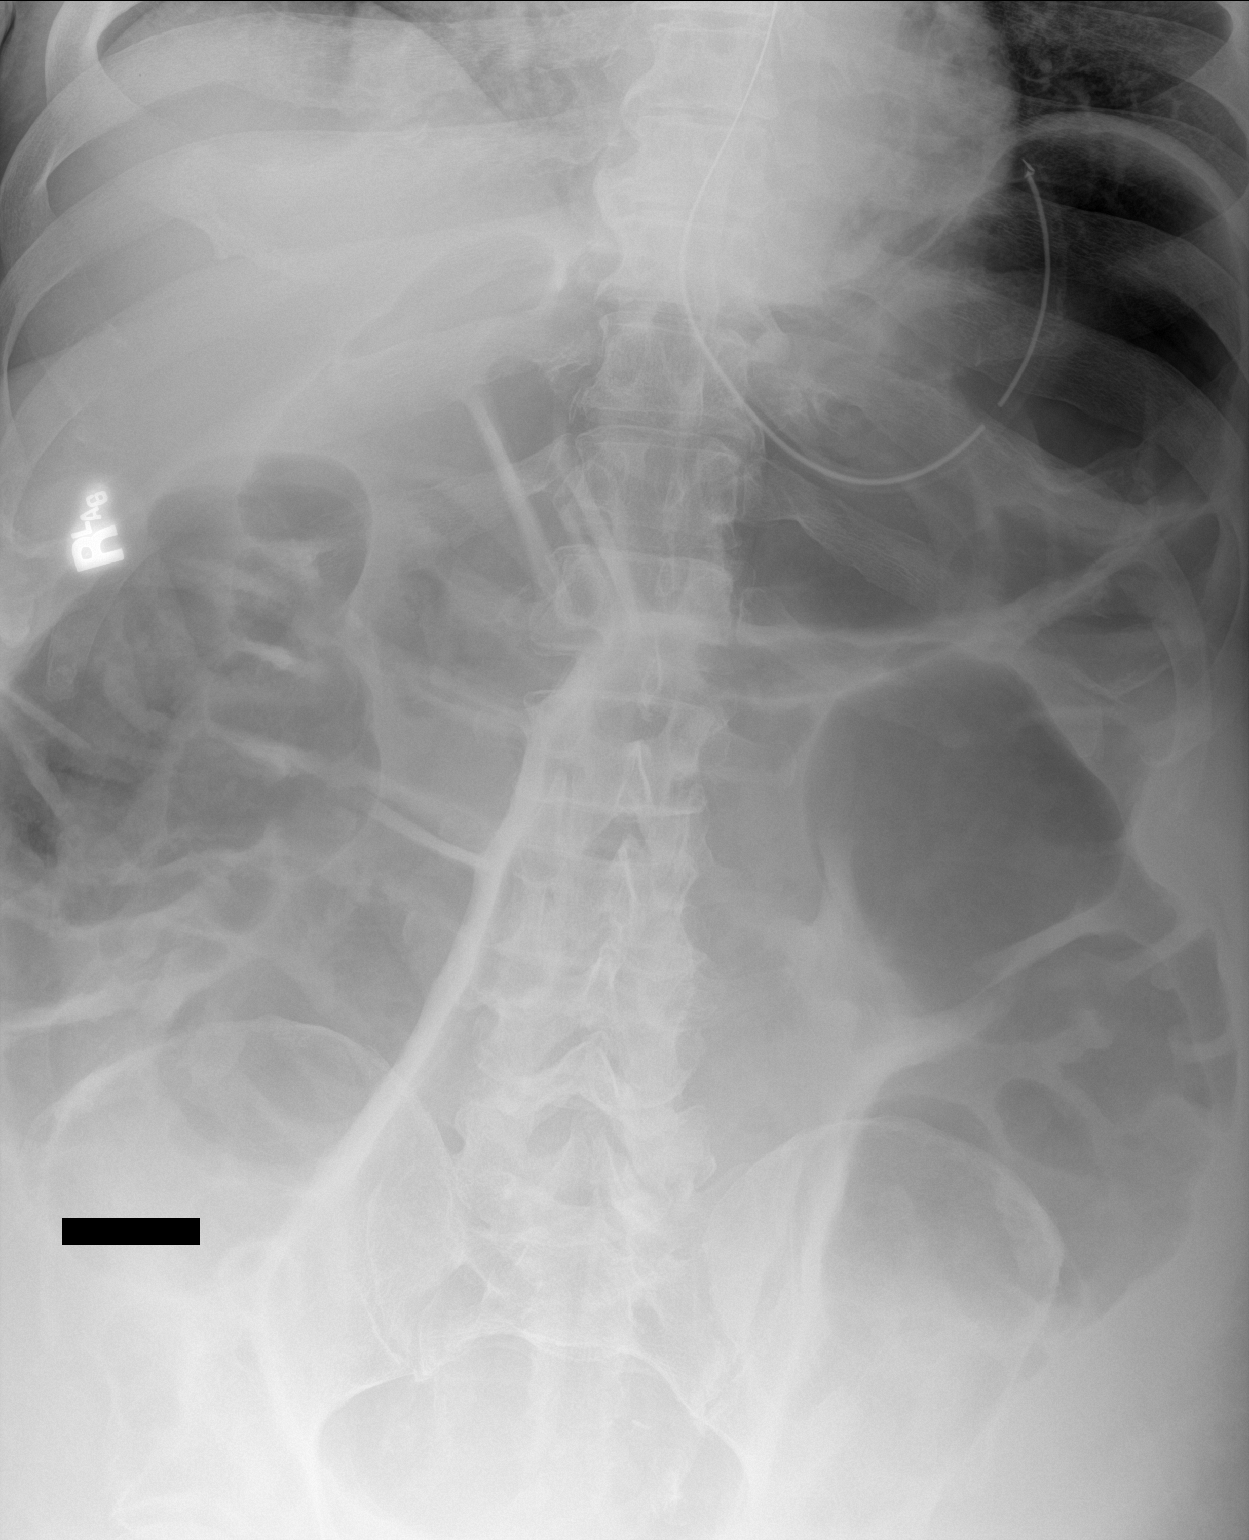

[abdomen kub (2 of 2)]
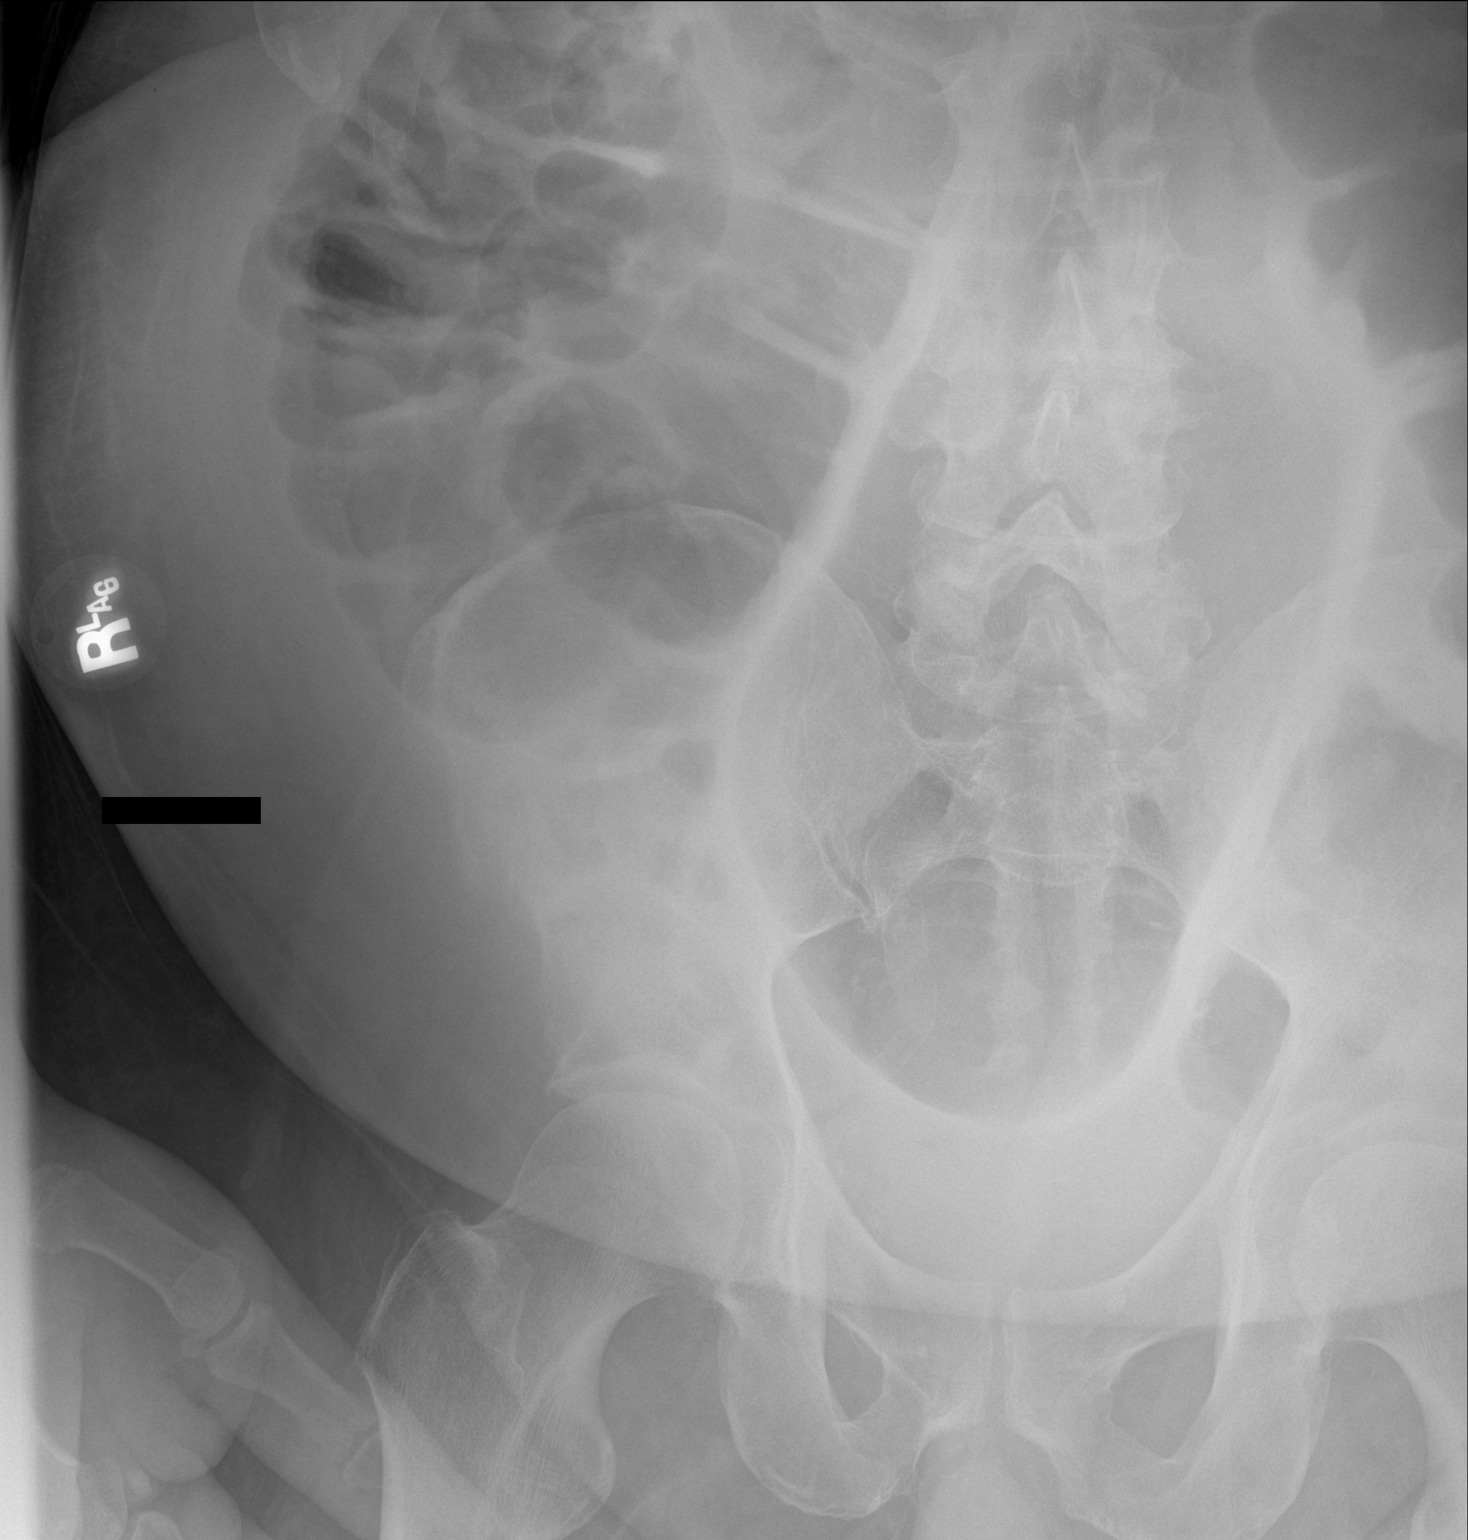

[2 of 2 positions shown; findings below may reference images not displayed]

FINDINGS: Diffuse gaseous distention of bowel, unchanged. No free air or
organomegaly. NG tube remains in the stomach.
IMPRESSION: Diffuse gaseous distention of bowel, unchanged.

## 2022-08-05 IMAGING — DX DG ABD PORTABLE 1V
2 series · 2 of 2 positions shown · non-contrast
Comparison: November 24, 2020 at [DATE] a.m.

CLINICAL DATA: Abdominal distention

EXAM:
PORTABLE ABDOMEN - 1 VIEW

[abdomen kub (1 of 2)]
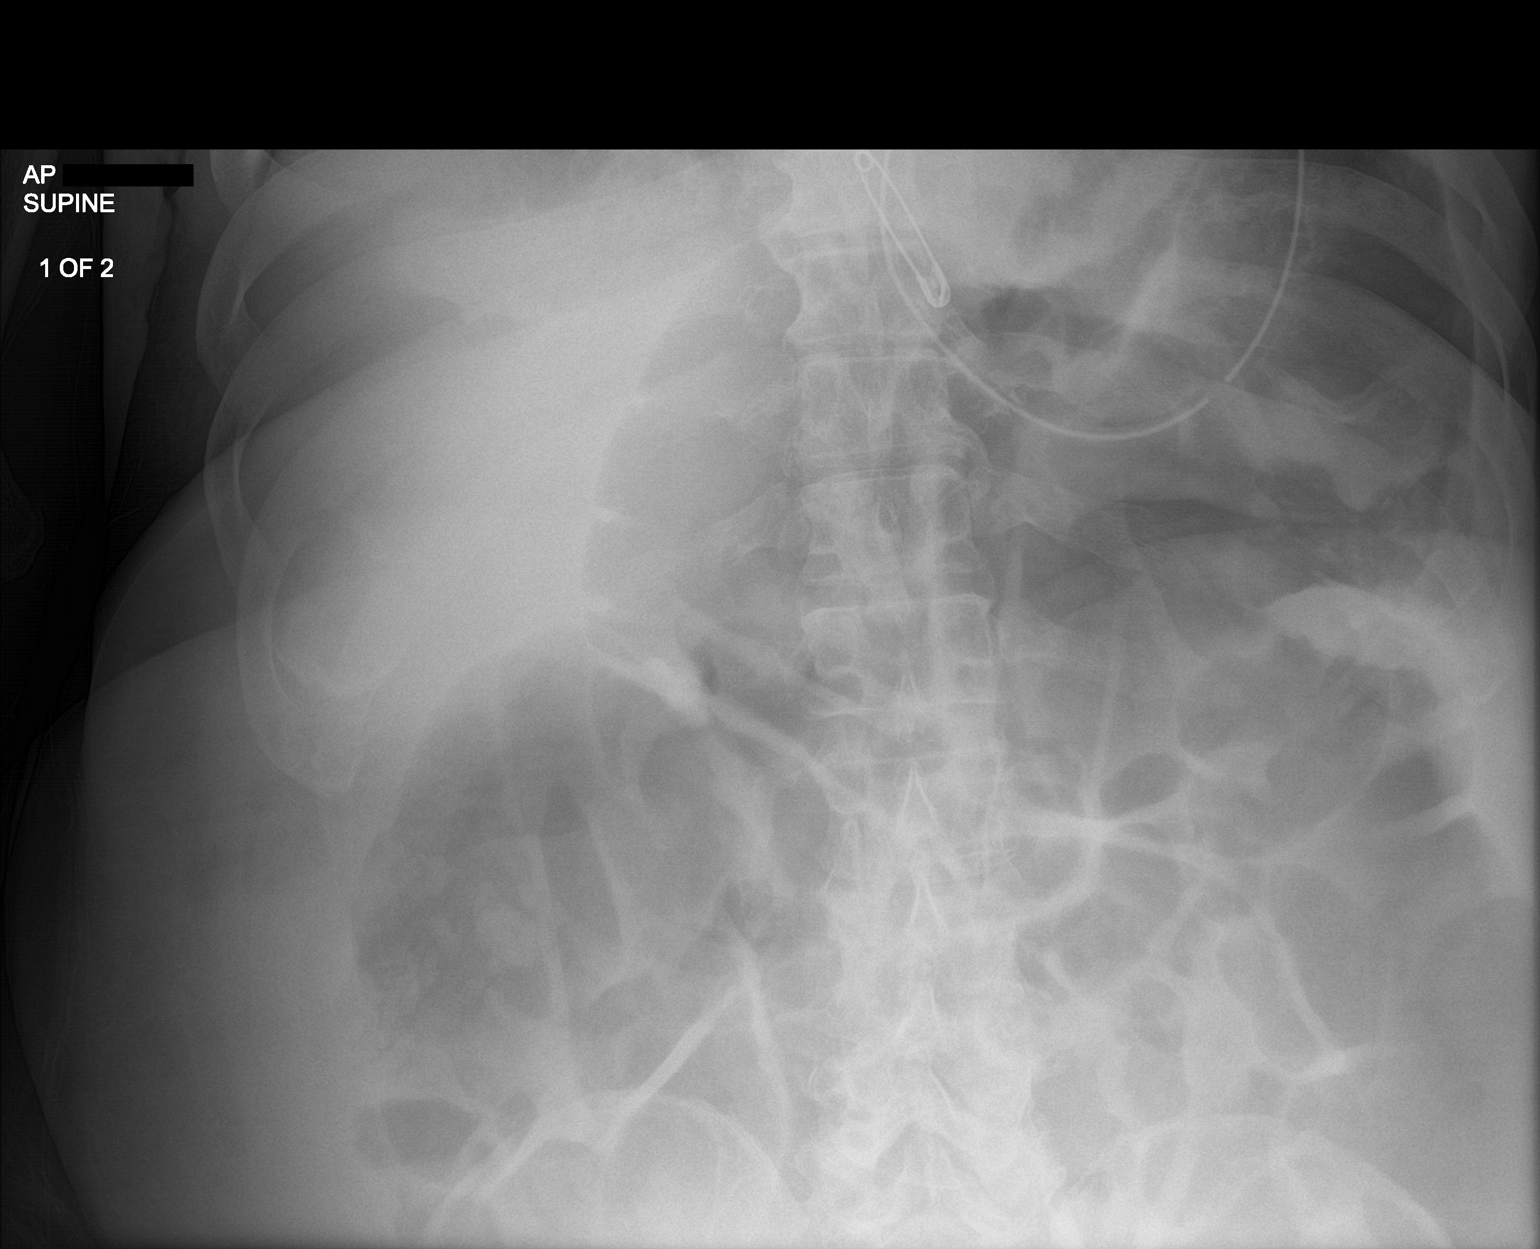

[abdomen kub (2 of 2)]
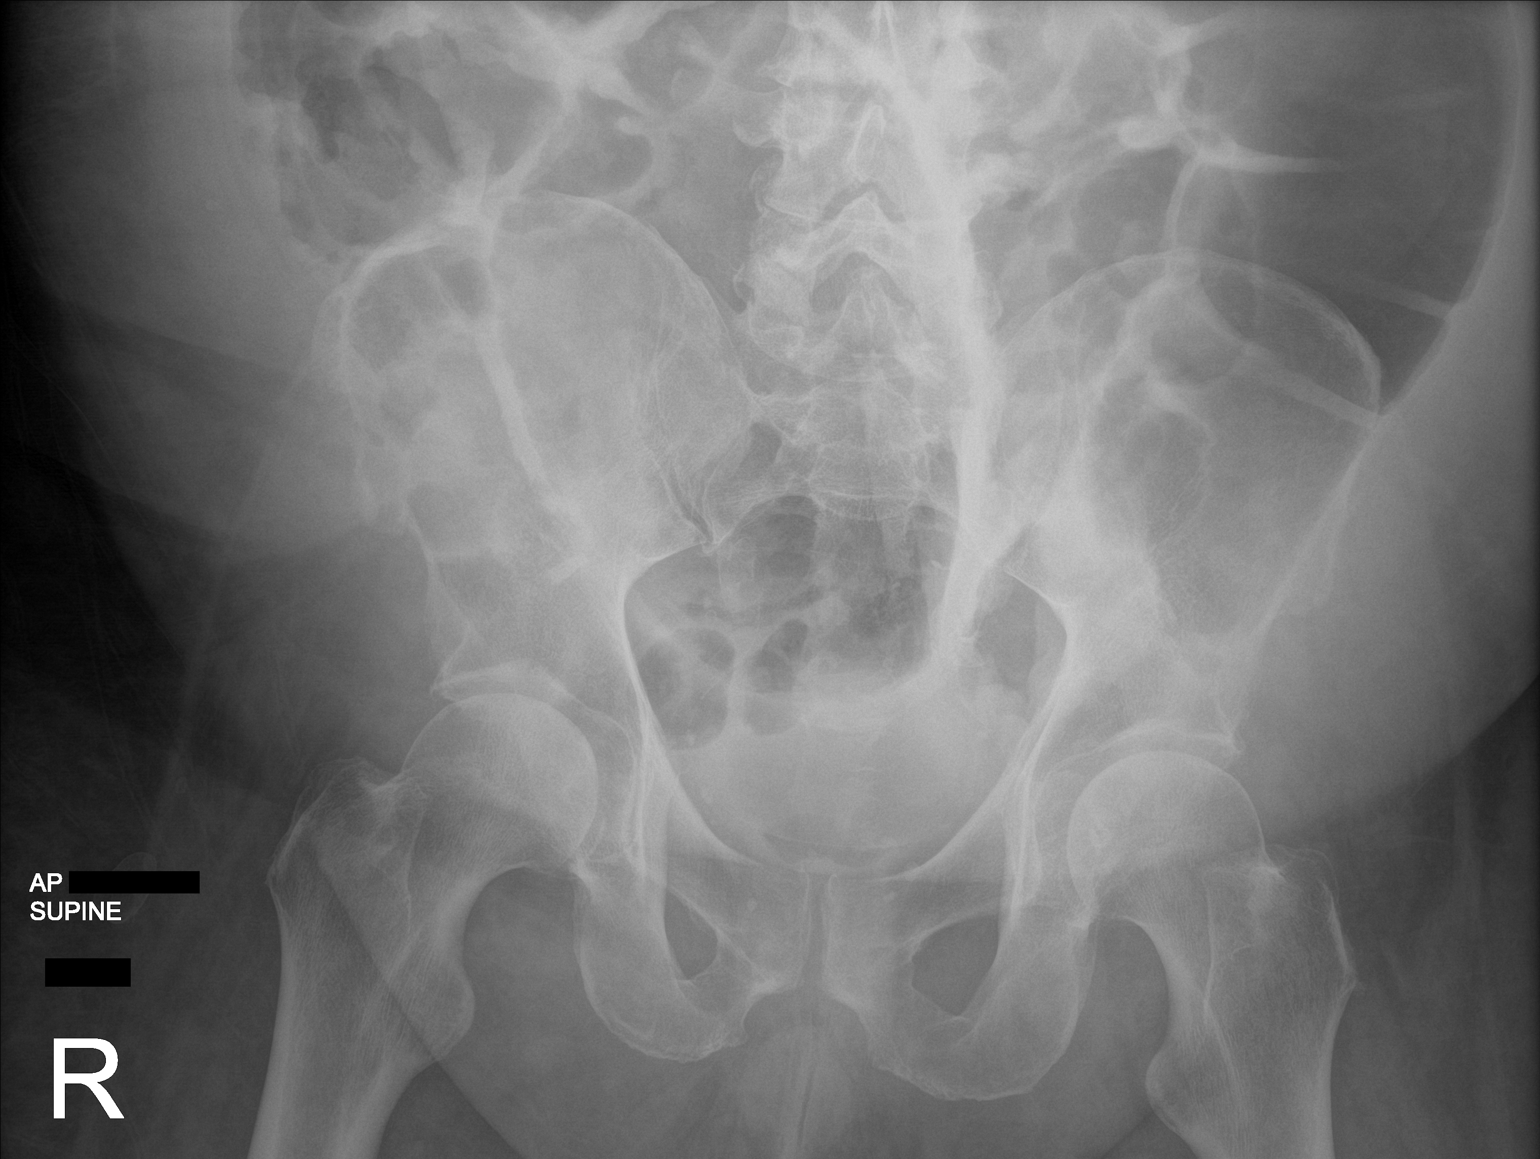

[2 of 2 positions shown; findings below may reference images not displayed]

FINDINGS: Stable NG tube, terminating in the stomach. Air-filled
prominent/dilated loops of bowel, primarily colon. The findings are
similar in the interval.
IMPRESSION: Continued diffuse gaseous distention of the bowel, unchanged. Stable
NG tube, terminating in the stomach.

## 2022-08-07 IMAGING — DX DG ABD PORTABLE 1V
1 series · 1 of 1 positions shown · non-contrast
Comparison: Abdominal x-ray dated November 25, 2020.

CLINICAL DATA: Follow-up ileus.

EXAM:
PORTABLE ABDOMEN - 1 VIEW

[abdomen kub]
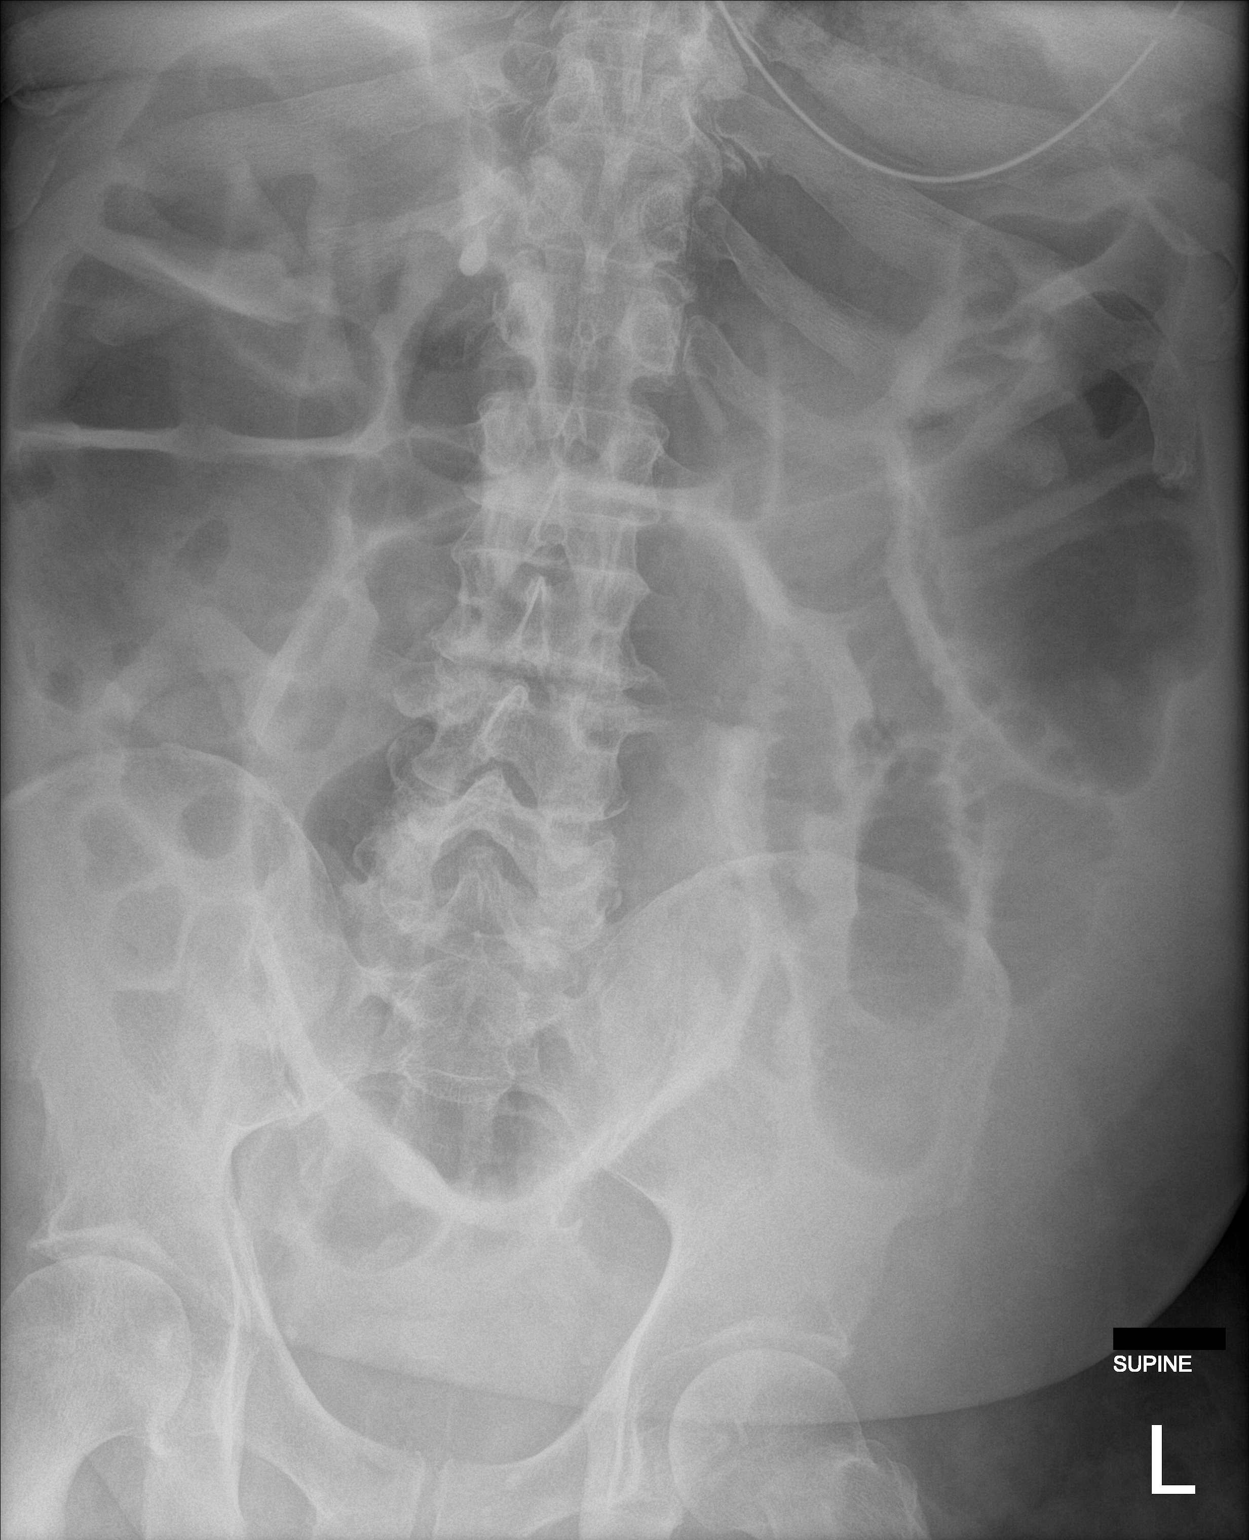

[1 of 1 positions shown; findings below may reference images not displayed]

FINDINGS: Unchanged enteric tube, incompletely visualized. Diffusely dilated
air-filled small bowel and colon, similar to prior study.
IMPRESSION: 1. Unchanged ileus.

## 2022-08-08 ENCOUNTER — Inpatient Hospital Stay (HOSPITAL_COMMUNITY)
Admission: EM | Admit: 2022-08-08 | Discharge: 2022-09-04 | DRG: 834 | Disposition: A | Discharge status: Skilled Nursing Facility | Payer: Medicare (Managed Care) | Source: Skilled Nursing Facility | Attending: Internal Medicine | Admitting: Internal Medicine

## 2022-08-08 ENCOUNTER — Emergency Department (HOSPITAL_COMMUNITY): Payer: Medicare (Managed Care)

## 2022-08-08 ENCOUNTER — Encounter (HOSPITAL_COMMUNITY): Payer: Self-pay

## 2022-08-08 ENCOUNTER — Other Ambulatory Visit: Payer: Self-pay

## 2022-08-08 DIAGNOSIS — D631 Anemia in chronic kidney disease: Secondary | ICD-10-CM | POA: Diagnosis present

## 2022-08-08 DIAGNOSIS — F32A Depression, unspecified: Secondary | ICD-10-CM | POA: Diagnosis present

## 2022-08-08 DIAGNOSIS — E872 Acidosis, unspecified: Secondary | ICD-10-CM | POA: Diagnosis present

## 2022-08-08 DIAGNOSIS — M549 Dorsalgia, unspecified: Secondary | ICD-10-CM | POA: Diagnosis present

## 2022-08-08 DIAGNOSIS — I5033 Acute on chronic diastolic (congestive) heart failure: Secondary | ICD-10-CM | POA: Diagnosis present

## 2022-08-08 DIAGNOSIS — I10 Essential (primary) hypertension: Secondary | ICD-10-CM | POA: Diagnosis not present

## 2022-08-08 DIAGNOSIS — J9621 Acute and chronic respiratory failure with hypoxia: Secondary | ICD-10-CM | POA: Diagnosis present

## 2022-08-08 DIAGNOSIS — E876 Hypokalemia: Secondary | ICD-10-CM | POA: Diagnosis present

## 2022-08-08 DIAGNOSIS — T43225A Adverse effect of selective serotonin reuptake inhibitors, initial encounter: Secondary | ICD-10-CM | POA: Diagnosis not present

## 2022-08-08 DIAGNOSIS — Z794 Long term (current) use of insulin: Secondary | ICD-10-CM

## 2022-08-08 DIAGNOSIS — I739 Peripheral vascular disease, unspecified: Secondary | ICD-10-CM | POA: Diagnosis not present

## 2022-08-08 DIAGNOSIS — I69351 Hemiplegia and hemiparesis following cerebral infarction affecting right dominant side: Secondary | ICD-10-CM | POA: Diagnosis not present

## 2022-08-08 DIAGNOSIS — R443 Hallucinations, unspecified: Secondary | ICD-10-CM | POA: Diagnosis not present

## 2022-08-08 DIAGNOSIS — N186 End stage renal disease: Secondary | ICD-10-CM | POA: Diagnosis not present

## 2022-08-08 DIAGNOSIS — E119 Type 2 diabetes mellitus without complications: Secondary | ICD-10-CM

## 2022-08-08 DIAGNOSIS — D61818 Other pancytopenia: Secondary | ICD-10-CM | POA: Diagnosis present

## 2022-08-08 DIAGNOSIS — F319 Bipolar disorder, unspecified: Secondary | ICD-10-CM | POA: Diagnosis present

## 2022-08-08 DIAGNOSIS — Z8673 Personal history of transient ischemic attack (TIA), and cerebral infarction without residual deficits: Secondary | ICD-10-CM

## 2022-08-08 DIAGNOSIS — G9349 Other encephalopathy: Secondary | ICD-10-CM | POA: Diagnosis present

## 2022-08-08 DIAGNOSIS — R338 Other retention of urine: Secondary | ICD-10-CM | POA: Diagnosis present

## 2022-08-08 DIAGNOSIS — D469 Myelodysplastic syndrome, unspecified: Secondary | ICD-10-CM | POA: Diagnosis present

## 2022-08-08 DIAGNOSIS — E871 Hypo-osmolality and hyponatremia: Secondary | ICD-10-CM | POA: Diagnosis present

## 2022-08-08 DIAGNOSIS — Z515 Encounter for palliative care: Secondary | ICD-10-CM | POA: Diagnosis not present

## 2022-08-08 DIAGNOSIS — Z79899 Other long term (current) drug therapy: Secondary | ICD-10-CM

## 2022-08-08 DIAGNOSIS — I350 Nonrheumatic aortic (valve) stenosis: Secondary | ICD-10-CM | POA: Diagnosis present

## 2022-08-08 DIAGNOSIS — E43 Unspecified severe protein-calorie malnutrition: Secondary | ICD-10-CM | POA: Diagnosis present

## 2022-08-08 DIAGNOSIS — I130 Hypertensive heart and chronic kidney disease with heart failure and stage 1 through stage 4 chronic kidney disease, or unspecified chronic kidney disease: Secondary | ICD-10-CM | POA: Diagnosis present

## 2022-08-08 DIAGNOSIS — E1122 Type 2 diabetes mellitus with diabetic chronic kidney disease: Secondary | ICD-10-CM | POA: Diagnosis present

## 2022-08-08 DIAGNOSIS — D630 Anemia in neoplastic disease: Secondary | ICD-10-CM | POA: Diagnosis present

## 2022-08-08 DIAGNOSIS — Z823 Family history of stroke: Secondary | ICD-10-CM

## 2022-08-08 DIAGNOSIS — N179 Acute kidney failure, unspecified: Secondary | ICD-10-CM

## 2022-08-08 DIAGNOSIS — R627 Adult failure to thrive: Secondary | ICD-10-CM | POA: Diagnosis present

## 2022-08-08 DIAGNOSIS — Z6841 Body Mass Index (BMI) 40.0 and over, adult: Secondary | ICD-10-CM

## 2022-08-08 DIAGNOSIS — N401 Enlarged prostate with lower urinary tract symptoms: Secondary | ICD-10-CM | POA: Diagnosis present

## 2022-08-08 DIAGNOSIS — J9601 Acute respiratory failure with hypoxia: Secondary | ICD-10-CM

## 2022-08-08 DIAGNOSIS — G9341 Metabolic encephalopathy: Secondary | ICD-10-CM | POA: Diagnosis present

## 2022-08-08 DIAGNOSIS — Z833 Family history of diabetes mellitus: Secondary | ICD-10-CM

## 2022-08-08 DIAGNOSIS — C9200 Acute myeloblastic leukemia, not having achieved remission: Principal | ICD-10-CM | POA: Diagnosis present

## 2022-08-08 DIAGNOSIS — N184 Chronic kidney disease, stage 4 (severe): Secondary | ICD-10-CM | POA: Diagnosis present

## 2022-08-08 DIAGNOSIS — Z6831 Body mass index (BMI) 31.0-31.9, adult: Secondary | ICD-10-CM

## 2022-08-08 DIAGNOSIS — E1165 Type 2 diabetes mellitus with hyperglycemia: Secondary | ICD-10-CM | POA: Diagnosis present

## 2022-08-08 DIAGNOSIS — Z66 Do not resuscitate: Secondary | ICD-10-CM | POA: Diagnosis present

## 2022-08-08 DIAGNOSIS — E1169 Type 2 diabetes mellitus with other specified complication: Secondary | ICD-10-CM | POA: Diagnosis not present

## 2022-08-08 DIAGNOSIS — E1151 Type 2 diabetes mellitus with diabetic peripheral angiopathy without gangrene: Secondary | ICD-10-CM | POA: Diagnosis present

## 2022-08-08 DIAGNOSIS — Z87891 Personal history of nicotine dependence: Secondary | ICD-10-CM

## 2022-08-08 DIAGNOSIS — E785 Hyperlipidemia, unspecified: Secondary | ICD-10-CM | POA: Diagnosis present

## 2022-08-08 DIAGNOSIS — E440 Moderate protein-calorie malnutrition: Secondary | ICD-10-CM | POA: Diagnosis not present

## 2022-08-08 DIAGNOSIS — Z634 Disappearance and death of family member: Secondary | ICD-10-CM

## 2022-08-08 DIAGNOSIS — I890 Lymphedema, not elsewhere classified: Secondary | ICD-10-CM | POA: Diagnosis present

## 2022-08-08 DIAGNOSIS — F40240 Claustrophobia: Secondary | ICD-10-CM | POA: Diagnosis present

## 2022-08-08 DIAGNOSIS — L89302 Pressure ulcer of unspecified buttock, stage 2: Secondary | ICD-10-CM

## 2022-08-08 DIAGNOSIS — Z992 Dependence on renal dialysis: Secondary | ICD-10-CM | POA: Diagnosis not present

## 2022-08-08 DIAGNOSIS — Z7982 Long term (current) use of aspirin: Secondary | ICD-10-CM

## 2022-08-08 DIAGNOSIS — R109 Unspecified abdominal pain: Secondary | ICD-10-CM | POA: Diagnosis present

## 2022-08-08 DIAGNOSIS — Z91018 Allergy to other foods: Secondary | ICD-10-CM

## 2022-08-08 DIAGNOSIS — Z8249 Family history of ischemic heart disease and other diseases of the circulatory system: Secondary | ICD-10-CM

## 2022-08-08 DIAGNOSIS — G8929 Other chronic pain: Secondary | ICD-10-CM | POA: Diagnosis present

## 2022-08-08 DIAGNOSIS — Z89512 Acquired absence of left leg below knee: Secondary | ICD-10-CM

## 2022-08-08 LAB — COMPREHENSIVE METABOLIC PANEL
ALT: 27 U/L (ref 0–44)
AST: 28 U/L (ref 15–41)
Albumin: 2.8 g/dL — ABNORMAL LOW (ref 3.5–5.0)
Alkaline Phosphatase: 73 U/L (ref 38–126)
Anion gap: 11 (ref 5–15)
BUN: 80 mg/dL — ABNORMAL HIGH (ref 8–23)
CO2: 20 mmol/L — ABNORMAL LOW (ref 22–32)
Calcium: 7.8 mg/dL — ABNORMAL LOW (ref 8.9–10.3)
Chloride: 102 mmol/L (ref 98–111)
Creatinine, Ser: 5.26 mg/dL — ABNORMAL HIGH (ref 0.61–1.24)
GFR, Estimated: 12 mL/min — ABNORMAL LOW (ref >60)
Glucose, Bld: 131 mg/dL — ABNORMAL HIGH (ref 70–99)
Potassium: 3.8 mmol/L (ref 3.5–5.1)
Sodium: 133 mmol/L — ABNORMAL LOW (ref 135–145)
Total Bilirubin: 0.6 mg/dL (ref 0.3–1.2)
Total Protein: 7.7 g/dL (ref 6.5–8.1)

## 2022-08-08 LAB — TYPE AND SCREEN: Unit division: 0

## 2022-08-08 LAB — RETICULOCYTES
Immature Retic Fract: 16.5 % — ABNORMAL HIGH (ref 2.3–15.9)
RBC.: 2.44 MIL/uL — ABNORMAL LOW (ref 4.22–5.81)
Retic Count, Absolute: 24.9 10*3/uL (ref 19.0–186.0)
Retic Ct Pct: 1 % (ref 0.4–3.1)

## 2022-08-08 LAB — POC OCCULT BLOOD, ED: Fecal Occult Bld: NEGATIVE

## 2022-08-08 LAB — CBC WITH DIFFERENTIAL/PLATELET
Abs Immature Granulocytes: 0.4 10*3/uL — ABNORMAL HIGH (ref 0.00–0.07)
Band Neutrophils: 8 %
Basophils Absolute: 0.1 10*3/uL (ref 0.0–0.1)
Basophils Relative: 3 %
Eosinophils Absolute: 0 10*3/uL (ref 0.0–0.5)
Eosinophils Relative: 2 %
HCT: 22.1 % — ABNORMAL LOW (ref 39.0–52.0)
Hemoglobin: 6.9 g/dL — CL (ref 13.0–17.0)
Lymphocytes Relative: 19 %
Lymphs Abs: 0.4 10*3/uL — ABNORMAL LOW (ref 0.7–4.0)
MCH: 28.2 pg (ref 26.0–34.0)
MCHC: 31.2 g/dL (ref 30.0–36.0)
MCV: 90.2 fL (ref 80.0–100.0)
Metamyelocytes Relative: 12 %
Monocytes Absolute: 0 10*3/uL — ABNORMAL LOW (ref 0.1–1.0)
Monocytes Relative: 0 %
Myelocytes: 10 %
Neutro Abs: 1 10*3/uL — ABNORMAL LOW (ref 1.7–7.7)
Neutrophils Relative %: 46 %
Platelets: 57 10*3/uL — ABNORMAL LOW (ref 150–400)
RBC: 2.45 MIL/uL — ABNORMAL LOW (ref 4.22–5.81)
RDW: 18.3 % — ABNORMAL HIGH (ref 11.5–15.5)
WBC: 1.9 10*3/uL — ABNORMAL LOW (ref 4.0–10.5)
nRBC: 0 % (ref 0.0–0.2)

## 2022-08-08 LAB — BRAIN NATRIURETIC PEPTIDE: B Natriuretic Peptide: 498.7 pg/mL — ABNORMAL HIGH (ref 0.0–100.0)

## 2022-08-08 LAB — CBG MONITORING, ED: Glucose-Capillary: 123 mg/dL — ABNORMAL HIGH (ref 70–99)

## 2022-08-08 LAB — PREPARE RBC (CROSSMATCH)

## 2022-08-08 LAB — BPAM RBC

## 2022-08-08 LAB — TROPONIN I (HIGH SENSITIVITY): Troponin I (High Sensitivity): 31 ng/L — ABNORMAL HIGH (ref <18)

## 2022-08-08 IMAGING — DX DG ABDOMEN 1V
2 series · 2 of 2 positions shown · non-contrast
Comparison: 11/27/2020 and older studies.

CLINICAL DATA: Follow-up ileus.

EXAM:
ABDOMEN - 1 VIEW

[abdomen kub (1 of 2)]
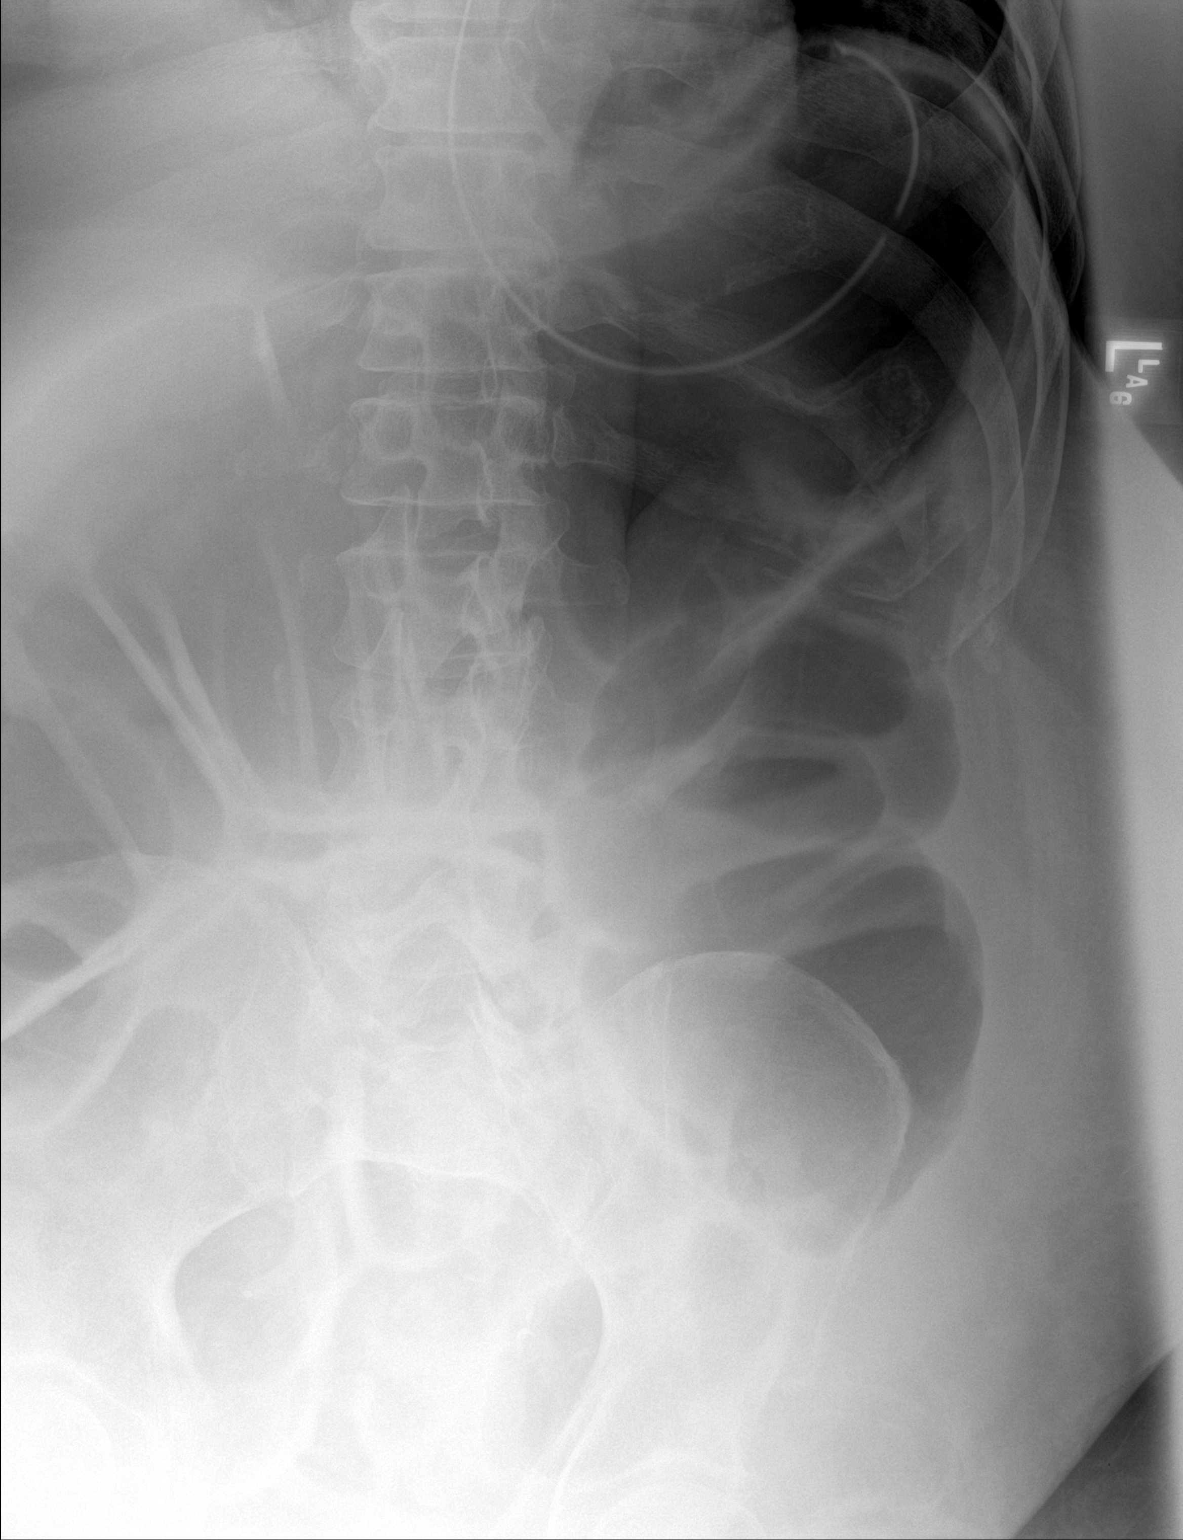

[abdomen kub (2 of 2)]
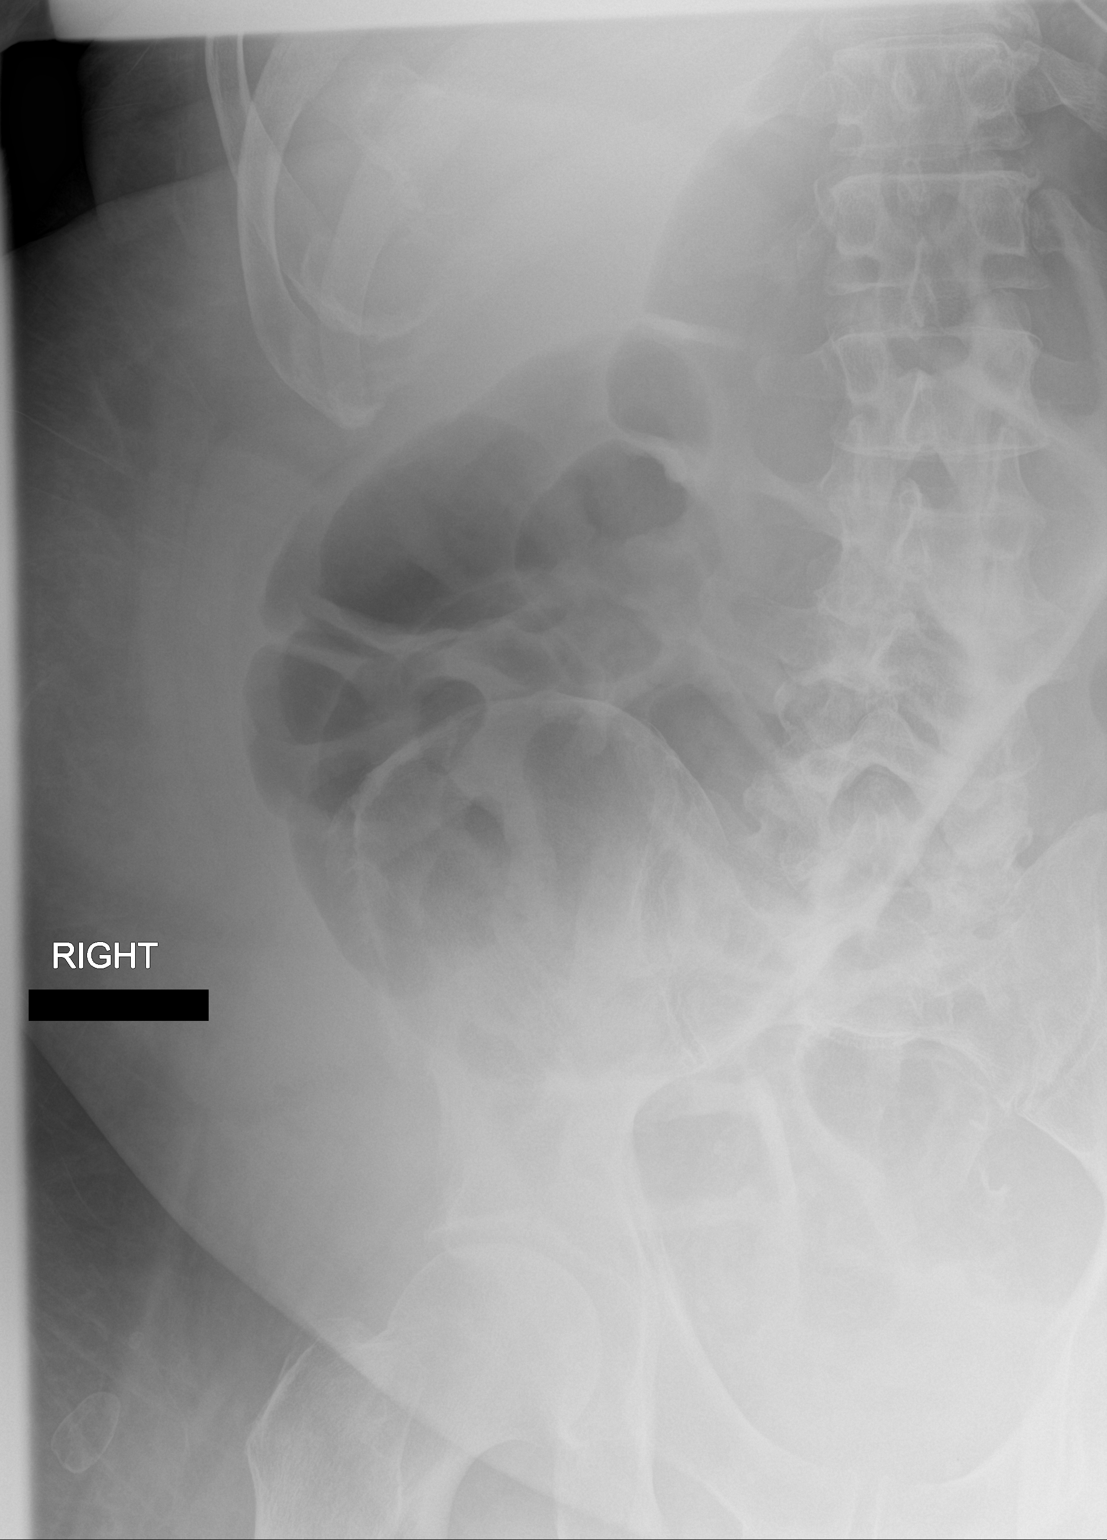

[2 of 2 positions shown; findings below may reference images not displayed]

FINDINGS: Gaseous distention of colon appears increased from the previous
day's exam, no current evidence of small bowel dilation.
Nasal/orogastric tube is stable, tip projecting in the left upper
quadrant.
IMPRESSION: 1. Mild interval increase in gaseous distention of the colon
compared to previous day's study.

## 2022-08-08 MED ORDER — CARVEDILOL 12.5 MG PO TABS
12.5000 mg | ORAL_TABLET | Freq: Two times a day (BID) | ORAL | Status: DC
  Administered 2022-08-09 – 2022-08-18 (×19): 12.5 mg via ORAL
  Filled 2022-08-08 (×20): qty 1

## 2022-08-08 MED ORDER — PANTOPRAZOLE SODIUM 40 MG PO TBEC
40.0000 mg | DELAYED_RELEASE_TABLET | Freq: Two times a day (BID) | ORAL | Status: DC
  Administered 2022-08-08 – 2022-08-09 (×2): 40 mg via ORAL
  Filled 2022-08-08 (×2): qty 1

## 2022-08-08 MED ORDER — ACETAMINOPHEN 325 MG PO TABS
650.0000 mg | ORAL_TABLET | Freq: Four times a day (QID) | ORAL | Status: DC | PRN
  Administered 2022-08-09 – 2022-09-02 (×17): 650 mg via ORAL
  Filled 2022-08-08 (×17): qty 2

## 2022-08-08 MED ORDER — SODIUM CHLORIDE 0.9% IV SOLUTION: Freq: Once | INTRAVENOUS | Status: AC

## 2022-08-08 MED ORDER — ALBUTEROL SULFATE (2.5 MG/3ML) 0.083% IN NEBU: 3.0000 mL | INHALATION_SOLUTION | Freq: Four times a day (QID) | RESPIRATORY_TRACT | Status: DC | PRN

## 2022-08-08 MED ORDER — AMLODIPINE BESYLATE 10 MG PO TABS
10.0000 mg | ORAL_TABLET | Freq: Every day | ORAL | Status: DC
  Administered 2022-08-09 – 2022-09-04 (×23): 10 mg via ORAL
  Filled 2022-08-08 (×26): qty 1

## 2022-08-08 MED ORDER — HYDRALAZINE HCL 50 MG PO TABS
50.0000 mg | ORAL_TABLET | Freq: Three times a day (TID) | ORAL | Status: DC
  Administered 2022-08-08 – 2022-08-19 (×29): 50 mg via ORAL
  Filled 2022-08-08 (×21): qty 1
  Filled 2022-08-08: qty 2
  Filled 2022-08-08 (×7): qty 1

## 2022-08-08 MED ORDER — INSULIN ASPART 100 UNIT/ML IJ SOLN
0.0000 [IU] | Freq: Three times a day (TID) | INTRAMUSCULAR | Status: DC
  Administered 2022-08-09 (×2): 1 [IU] via SUBCUTANEOUS
  Administered 2022-08-11: 3 [IU] via SUBCUTANEOUS
  Administered 2022-08-12 – 2022-08-14 (×4): 1 [IU] via SUBCUTANEOUS
  Administered 2022-08-14: 2 [IU] via SUBCUTANEOUS
  Administered 2022-08-22 – 2022-09-02 (×5): 1 [IU] via SUBCUTANEOUS
  Filled 2022-08-08: qty 0.06

## 2022-08-08 MED ORDER — SERTRALINE HCL 50 MG PO TABS
50.0000 mg | ORAL_TABLET | ORAL | Status: DC
  Administered 2022-08-09 – 2022-08-19 (×5): 50 mg via ORAL
  Filled 2022-08-08 (×9): qty 1

## 2022-08-08 MED ORDER — SODIUM BICARBONATE 650 MG PO TABS
1950.0000 mg | ORAL_TABLET | Freq: Two times a day (BID) | ORAL | Status: DC
  Administered 2022-08-08 – 2022-08-15 (×15): 1950 mg via ORAL
  Filled 2022-08-08 (×16): qty 3

## 2022-08-08 MED ORDER — INSULIN ASPART 100 UNIT/ML IJ SOLN
0.0000 [IU] | Freq: Every day | INTRAMUSCULAR | Status: DC
  Administered 2022-08-10: 2 [IU] via SUBCUTANEOUS
  Administered 2022-08-11: 5 [IU] via SUBCUTANEOUS
  Administered 2022-08-13: 4 [IU] via SUBCUTANEOUS
  Filled 2022-08-08: qty 0.05

## 2022-08-08 MED ORDER — ATORVASTATIN CALCIUM 80 MG PO TABS
80.0000 mg | ORAL_TABLET | Freq: Every day | ORAL | Status: DC
  Administered 2022-08-08 – 2022-09-01 (×21): 80 mg via ORAL
  Filled 2022-08-08 (×18): qty 1
  Filled 2022-08-08: qty 2
  Filled 2022-08-08 (×6): qty 1

## 2022-08-08 MED ORDER — SODIUM CHLORIDE 0.9% FLUSH
3.0000 mL | Freq: Two times a day (BID) | INTRAVENOUS | Status: DC
  Administered 2022-08-08 – 2022-09-04 (×47): 3 mL via INTRAVENOUS

## 2022-08-08 MED ORDER — ACETAMINOPHEN 650 MG RE SUPP: 650.0000 mg | Freq: Four times a day (QID) | RECTAL | Status: DC | PRN

## 2022-08-08 MED ORDER — ASPIRIN 81 MG PO CHEW
81.0000 mg | CHEWABLE_TABLET | Freq: Every day | ORAL | Status: DC
  Administered 2022-08-09 – 2022-08-25 (×15): 81 mg via ORAL
  Filled 2022-08-08 (×16): qty 1

## 2022-08-08 MED ORDER — HYDROMORPHONE HCL 1 MG/ML IJ SOLN
0.5000 mg | INTRAMUSCULAR | Status: DC | PRN
  Administered 2022-08-09 – 2022-08-23 (×5): 0.5 mg via INTRAVENOUS
  Filled 2022-08-08 (×8): qty 0.5

## 2022-08-08 MED ORDER — FUROSEMIDE 10 MG/ML IJ SOLN
60.0000 mg | Freq: Two times a day (BID) | INTRAMUSCULAR | Status: DC
  Administered 2022-08-08 – 2022-08-09 (×2): 60 mg via INTRAVENOUS
  Filled 2022-08-08: qty 8
  Filled 2022-08-08: qty 6

## 2022-08-08 NOTE — ED Triage Notes (Signed)
EMS reports- Blood working was done at the facility and resulted in low hemoglobin less than 7.  Has received normal saline  Swollen left forearm  BP 146/84 65 HR 94 02 room air

## 2022-08-08 NOTE — ED Provider Notes (Signed)
[place] EMERGENCY DEPARTMENT AT [facility] Provider Note   CSN: [id number] Arrival date & time: 08/08/22  2008     History  Chief Complaint  Patient presents with   abnormal labs     [name] is a 62 y.o. male with multiple co morbidities presenting with Hgb < 7.0 from long-term care facility. No dizziness or shortness of breath. No chest pain. Last BM a couple days ago, unsure if there was any blood in it.  No nausea, vomiting.  Recently admitted from 5/22-5/25 for CHF exacerbation and found to have acute blood loss anemia secondary to suspected GI bleed. At that time, FOBT+ and received 3u pRBC during hospitalization. GI consulted and did not have indication for endoscopic procedure due to stable Hgb with recommendation for repeat CBC in 1 week.  Past Medical History:  Diagnosis Date   Acute on chronic diastolic CHF (congestive heart failure) (HCC) 08/22/2020   Acute on chronic heart failure with preserved ejection fraction (HCC)    Acute renal failure superimposed on stage 3a chronic kidney disease (HCC) 11/18/2020   Arthritis    Bacteremia due to Enterococcus 12/09/2021   Bipolar affective (HCC)    CHF (congestive heart failure) (HCC)    Claustrophobia    Severe   Cocaine abuse (HCC) 05/31/2014   Cocaine dependence, uncomplicated (HCC) 05/31/2014   Depression    Diabetes mellitus    uncontrolled    Elevated CK    Heart murmur    Hypertension    Ileus (HCC) 11/18/2020   Metabolic acidosis 10/23/2021   Non-traumatic rhabdomyolysis 01/01/2020   Pericardial effusion 08/22/2020   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 Authorizing Provider  acetaminophen (TYLENOL) 650 MG CR tablet Take 650 mg by mouth every 4 (four) hours as needed (for discomfort- cannot exceed 3,000 mg from all  combined sources/24 hours).    [provider]  albuterol (VENTOLIN HFA) 108 (90 Base) MCG/ACT inhaler Inhale 2 puffs into the lungs every 6 (six) hours as needed for wheezing or shortness of breath.    [provider]  amLODipine (NORVASC) 10 MG tablet Take 1 tablet (10 mg total) by mouth daily. Patient taking differently: Take 10 mg by mouth See admin instructions. Take 10 mg by mouth once a day and hold if Systolic is <100 or Diastolic <78. Call NP is B/P is over 160/90. 12/11/21   [name], [name], DO  ascorbic acid (VITAMIN C) 500 MG tablet Take 500 mg by mouth 2 (two) times daily.    [provider]  aspirin 81 MG chewable tablet Chew 81 mg by mouth daily.    [provider]  atorvastatin (LIPITOR) 80 MG tablet Take 80 mg by mouth at bedtime.    [provider]  brimonidine (ALPHAGAN) 0.2 % ophthalmic solution Place 1 drop into both eyes 2 (two) times daily.    [provider]  carvedilol (COREG) 12.5 MG tablet Take 1 tablet (12.5 mg total) by mouth 2 (two) times daily with a meal. 04/02/22   [name], Loura Halt, MD  docusate sodium (COLACE) 100 MG capsule Take 100 mg by mouth at bedtime.    [provider]  dorzolamide (TRUSOPT) 2 % ophthalmic solution Place 1 drop into both eyes 2 (two) times daily.    [provider]  ferrous sulfate 325 (65 FE) MG tablet Take 1 tablet (325 mg total) by mouth daily. 09/23/20   Marguerita Merles Latif, DO  hydrALAZINE (APRESOLINE) 50 MG tablet Take 1 tablet (50 mg total) by mouth every 8 (eight) hours. Patient taking differently: Take 50 mg by mouth See admin instructions. Take 50 mg by mouth at 4 AM, 10 AM, 4 PM, and 10 PM- hold for a Systolic reading less than 100 04/02/22   [name], [name], MD  insulin aspart (NOVOLOG FLEXPEN) 100 UNIT/ML FlexPen Inject 2-10 Units into the skin See admin instructions. Inject 2-10 units into the skin three times a day before meals, per sliding scale: BGL 131-180  = 2 units; 181-240 = 4 units; 241-300 = 6 units; 301-350 = 8 units; 351-400 = 10 units; >400 = CALL MD    [provider]  LANTUS SOLOSTAR 100 UNIT/ML Solostar Pen Inject 10 Units into the skin in the morning.    [provider]  latanoprost (XALATAN) 0.005 % ophthalmic solution Place 1 drop into both eyes at bedtime.    [provider]  Multiple Vitamin (MULTIVITAMIN) tablet Take 1 tablet by mouth See admin instructions. Take 1 tablet by mouth at 9 AM and 10 AM    [provider]  Nutritional Supplements (FEEDING SUPPLEMENT, NEPRO CARB STEADY,) LIQD Take 237 mLs by mouth at bedtime. Patient not taking: Reported on 07/17/2022 10/31/21   Alfredo Martinez, MD  ondansetron (ZOFRAN) 4 MG tablet Take 4 mg by mouth every 4 (four) hours as needed for nausea or vomiting.    [provider]  OXYGEN Inhale 2 L/min into the lungs as needed (for sats below 90%).    [provider]  pantoprazole (PROTONIX) 40 MG tablet Take 1 tablet (40 mg total) by mouth 2 (two) times daily. 07/20/22   [name], [name], DO  potassium chloride SA (KLOR-CON M) 20 MEQ tablet Take 20 mEq by mouth in the morning.    [provider]  pregabalin (LYRICA) 75 MG capsule Take 1 capsule (75 mg total) by mouth daily. 10/31/21   Alfredo Martinez, MD  PROTEIN PO Take 30 mLs by mouth 2 (two) times daily.    [provider]  saccharomyces boulardii (FLORASTOR) 250 MG capsule Take 1 capsule (250 mg total) by mouth 2 (two) times daily. 09/07/20   Lanae Boast, MD  sertraline (ZOLOFT) 50 MG tablet Take 50 mg by mouth every other day.    [provider]  sodium bicarbonate 650 MG tablet Take 2 tablets (1,300 mg total) by mouth 2 (two) times daily. Patient taking differently: Take 1,950 mg by mouth 2 (two) times daily. 10/31/21   Alfredo Martinez, MD  tamsulosin (FLOMAX) 0.4 MG CAPS capsule Take 0.8 mg by mouth at bedtime.    [provider]  torsemide (DEMADEX) 20 MG tablet  Take 40-80 mg by mouth See admin instructions. Take 80 mg by mouth at 8 AM and 40 mg at 7 PM    [provider]  Vitamin D, Ergocalciferol, (DRISDOL) 1.25 MG (50000 UNIT) CAPS capsule Take 50,000 Units by mouth every 30 (thirty) days. 17th of each month    [provider]  zinc sulfate 220 (50 Zn) MG capsule Take 220 mg by mouth daily.    [provider]      Allergies    Sertraline    Review of Systems   Review of Systems  Constitutional:  Negative for chills and fever.    Physical  Exam Updated Vital Signs BP (!) 145/90   Pulse 68   Temp 98.7 F (37.1 C)   Resp 18   SpO2 99%  Physical Exam Constitutional:      Comments: Chronically ill-appearing  HENT:     Mouth/Throat:     Mouth: Mucous membranes are moist.     Pharynx: Oropharynx is clear.     Comments: Poor dentition, missing teeth Eyes:     Pupils: Pupils are equal, round, and reactive to light.     Comments: Right eye with cataract  Abdomen    Obese, distended, non-tender to palpation Cardiovascular:     Rate and Rhythm: Normal rate and regular rhythm.  Pulmonary:     Comments: Mildly increased work of breathing, on 2 L nasal cannula Extremities     Chronic vascular changes in right lower extremity, 2-3+ pitting edema to hip     2-3+ pitting edema in right upper extremity Neurological:     Mental Status: He is alert.     ED Results / Procedures / Treatments   Labs (all labs ordered are listed, but only abnormal results are displayed) Labs Reviewed  CBC WITH DIFFERENTIAL/PLATELET - Abnormal; Notable for the following components:      Result Value   WBC 1.9 (*)    RBC 2.45 (*)    Hemoglobin 6.9 (*)    HCT 22.1 (*)    RDW 18.3 (*)    Platelets 57 (*)    Neutro Abs 1.0 (*)    Lymphs Abs 0.4 (*)    Monocytes Absolute 0.0 (*)    Abs Immature Granulocytes 0.40 (*)    All other components within normal limits  COMPREHENSIVE METABOLIC PANEL - Abnormal; Notable for the following  components:   Sodium 133 (*)    CO2 20 (*)    Glucose, Bld 131 (*)    BUN 80 (*)    Creatinine, Ser 5.26 (*)    Calcium 7.8 (*)    Albumin 2.8 (*)    GFR, Estimated 12 (*)    All other components within normal limits  BRAIN NATRIURETIC PEPTIDE - Abnormal; Notable for the following components:   B Natriuretic Peptide 498.7 (*)    All other components within normal limits  URINALYSIS, ROUTINE W REFLEX MICROSCOPIC  PATHOLOGIST SMEAR REVIEW  POC OCCULT BLOOD, ED  TYPE AND SCREEN  TROPONIN I (HIGH SENSITIVITY)    EKG None  Radiology DG Chest 2 View  Result Date: 08/08/2022 CLINICAL DATA:  [id number] with dyspnea. EXAM: CHEST - 2 VIEW COMPARISON:  Portable chest 07/17/2022, CT chest no contrast 03/17/2022 FINDINGS: The heart is moderately enlarged. Perihilar vascular congestion and edema are again noted. There is patchy airspace disease in the lung fields which could be airspace edema, pneumonia or combination. There are small layering pleural effusions. Mediastinum is normally outlined. There are degenerative changes and mild dextroscoliosis of the thoracic spine. Similar findings were noted on both prior studies. IMPRESSION: 1. Cardiomegaly with perihilar vascular congestion and edema. 2. Patchy airspace disease in the lung fields which could be airspace edema, pneumonia or combination. 3. Small layering pleural effusions. 4. Clinical correlation and radiographic follow-up recommended. Electronically Signed   By: Almira Bar M.D.   On: 08/08/2022 21:18    Procedures Procedures    Medications Ordered in ED Medications - No data to display  ED Course/ Medical Decision Making/ A&P  Medical Decision Making 62 year old male with multiple comorbidities but notably history of CKD stage IV, acute on chronic CHF  presenting with report of Hgb <7.0 from care facility. Labs reveal worsening renal function, now CKD stage V with creatinine 5.26 and GFR 12 with BUN  80.  No symptoms of uremia. Did not receive PRBC transfusion in ED as he is hemodynamically stable and largely asymptomatic at this time.  Likely underlying anemia of chronic disease.  No active bleeding, FOBT negative. Requires admission for worsening renal function requiring monitoring and hypervolemia (anasarca) with new oxygen requirement.  Chest x-ray with vascular congestion and edema, with patchy airspace disease possibly due to edema versus pneumonia.  Would likely benefit from IV diuresis. Discussed with [facility] hospitalist who will evaluate the patient for admission.  Amount and/or Complexity of Data Reviewed Labs: ordered. Radiology: ordered.  Risk Decision regarding hospitalization.           Final Clinical Impression(s) / ED Diagnoses Final diagnoses:  None    Rx / DC Orders ED Discharge Orders     None         Darral Dash, DO 08/08/22 2236   Darral Dash, DO 08/08/22 2240    Horton, Clabe Seal, DO 08/23/22 [id number]

## 2022-08-08 NOTE — ED Notes (Signed)
Date and time results received: 08/08/22 9:42 PM  (use smartphrase ".now" to insert current time)  Test: Hemoglobin Critical Value: 6.9  Name of Provider Notified: Horton   Orders Received? Or Actions Taken?: Orders Received - See Orders for details

## 2022-08-08 NOTE — ED Notes (Signed)
[facility] called for transport. 

## 2022-08-08 NOTE — H&P (Addendum)
History and Physical    [name] ZOX:[id number] DOB: [birth year] DOA: 08/08/2022  PCP: [name], MD   Patient coming from: SNF  Chief Complaint: Low Hgb   HPI: [name] is a 62 y.o. male with medical history significant for hypertension, insulin-dependent diabetes mellitus, history of CVA, PAD, and chronic HFpEF who presents from his SNF for evaluation of low hemoglobin.  Patient was admitted to the hospital roughly 3 weeks ago with acute on chronic HFpEF, acute hypoxic respiratory failure, and a new pancytopenia with positive FOBT.  He was seen by GI, transfused with 3 units of RBC, did not undergo any endoscopy, but was discharged with twice daily Protonix and recommendation for repeat CBC.  CBC was repeated at his SNF and he was sent to the ED for low hemoglobin.  Patient denies any abdominal pain, melena, hematochezia, or hematemesis.  He does not feel particularly short of breath and denies chills or new productive cough.  ED Course: Upon arrival to the ED, patient is found to be afebrile with normal heart rate, stable blood pressure, and supplemental oxygen requirement.  Labs are most notable for BUN 80, creatinine 5.26, WBC 1900, hemoglobin 6.9, platelets 57,000, BNP 499, and negative FOBT.  Chest x-ray reveals cardiomegaly with vascular congestion, edema, and small pleural effusions.  Nephrology (Dr. Juel Burrow) was consulted by the ED physician and medical admission to [facility] was recommended.  Review of Systems:  All other systems reviewed and apart from HPI, are negative.  Past Medical History:  Diagnosis Date   Acute on chronic diastolic CHF (congestive heart failure) (HCC) 08/22/2020   Acute on chronic heart failure with preserved ejection fraction (HCC)    Acute renal failure superimposed on stage 3a chronic kidney disease (HCC) 11/18/2020   Arthritis    Bacteremia due to Enterococcus 12/09/2021   Bipolar affective (HCC)    CHF (congestive heart  failure) (HCC)    Claustrophobia    Severe   Cocaine abuse (HCC) 05/31/2014   Cocaine dependence, uncomplicated (HCC) 05/31/2014   Depression    Diabetes mellitus    uncontrolled    Elevated CK    Heart murmur    Hypertension    Ileus (HCC) 11/18/2020   Metabolic acidosis 10/23/2021   Non-traumatic rhabdomyolysis 01/01/2020   Pericardial effusion 08/22/2020   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   Suicidal ideations 08/28/2013    Past Surgical History:  Procedure Laterality Date   ABDOMINAL AORTOGRAM W/LOWER EXTREMITY Left 03/02/2021   Procedure: ABDOMINAL AORTOGRAM W/LOWER EXTREMITY;  Surgeon: Leonie Douglas, MD;  Location: [facility];  Service: Cardiovascular;  Laterality: Left;   AMPUTATION Left 03/07/2021   Procedure: LEFT BELOW KNEE AMPUTATION;  Surgeon: Leonie Douglas, MD;  Location: [facility] OR;  Service: Vascular;  Laterality: Left;   APPLICATION OF WOUND VAC Left 03/07/2021   Procedure: APPLICATION OF WOUND VAC;  Surgeon: Leonie Douglas, MD;  Location: [facility] OR;  Service: Vascular;  Laterality: Left;   BOWEL DECOMPRESSION N/A 01/19/2020   Procedure: BOWEL DECOMPRESSION;  Surgeon: Kathi Der, MD;  Location: [facility] ENDOSCOPY;  Service: Gastroenterology;  Laterality: N/A;   BOWEL DECOMPRESSION N/A 11/19/2020   Procedure: BOWEL DECOMPRESSION;  Surgeon: Charlott Rakes, MD;  Location: [facility] ENDOSCOPY;  Service: Endoscopy;  Laterality: N/A;   BOWEL DECOMPRESSION N/A 12/03/2020   Procedure: BOWEL DECOMPRESSION;  Surgeon: Kerin Salen, MD;  Location: [facility] ENDOSCOPY;  Service: Gastroenterology;  Laterality: N/A;   COLONOSCOPY WITH PROPOFOL  N/A 01/19/2020   Procedure: COLONOSCOPY WITH PROPOFOL;  Surgeon: Kathi Der, MD;  Location: [facility] ENDOS[facility];  Service: Gastroenterology;  Laterality: N/A;   COLONOSCOPY WITH PROPOFOL N/A 11/19/2020   Procedure: COLONOSCOPY WITH PROPOFOL;  Surgeon: Charlott Rakes, MD;  Location: [facility] ENDOSCOPY;  Service:  Endoscopy;  Laterality: N/A;   COLONOSCOPY WITH PROPOFOL N/A 12/03/2020   Procedure: COLONOSCOPY WITH PROPOFOL;  Surgeon: Kerin Salen, MD;  Location: [facility] ENDOSCOPY;  Service: Gastroenterology;  Laterality: N/A;   KNEE SURGERY     b/l knees; pending total knee    RADIOLOGY WITH ANESTHESIA N/A 07/04/2020   Procedure: MRI WITH ANESTHESIA BRAIN WITHOUT CONTRAST AND HEAD WITHOUT CONTRAST;  Surgeon: Radiologist, Medication, MD;  Location: [facility] OR;  Service: Radiology;  Laterality: N/A;   TRANSMETATARSAL AMPUTATION Left 03/01/2021   Procedure: TRANSMETATARSAL AMPUTATION;  Surgeon: Vivi Barrack, DPM;  Location: [facility] OR;  Service: Podiatry;  Laterality: Left;    Social History:   reports that he has quit smoking. He has never used smokeless tobacco. He reports that he does not currently use drugs after having used the following drugs: Cocaine. He reports that he does not drink alcohol.  Allergies  Allergen Reactions   Sertraline Other (See Comments)    Reaction not known    Family History  Problem Relation Age of Onset   Diabetes Other    Cancer Mother        uterus; deceased    Heart attack Mother 31   Hypertension Other    Heart attack Father 42   Stroke Sister        12/2012 age 24 y.o     Prior to Admission medications   Medication Sig Start Date End Date Taking? Authorizing Provider  acetaminophen (TYLENOL) 650 MG CR tablet Take 650 mg by mouth every 4 (four) hours as needed (for discomfort- cannot exceed 3,000 mg from all combined sources/24 hours).    [provider]  albuterol (VENTOLIN HFA) 108 (90 Base) MCG/ACT inhaler Inhale 2 puffs into the lungs every 6 (six) hours as needed for wheezing or shortness of breath.    [provider]  amLODipine (NORVASC) 10 MG tablet Take 1 tablet (10 mg total) by mouth daily. Patient taking differently: Take 10 mg by mouth See admin instructions. Take 10 mg by mouth once a day and hold if Systolic is <100 or Diastolic <16. Call  NP is B/P is over 160/90. 12/11/21   [name], [name], DO  ascorbic acid (VITAMIN C) 500 MG tablet Take 500 mg by mouth 2 (two) times daily.    [provider]  aspirin 81 MG chewable tablet Chew 81 mg by mouth daily.    [provider]  atorvastatin (LIPITOR) 80 MG tablet Take 80 mg by mouth at bedtime.    [provider]  brimonidine (ALPHAGAN) 0.2 % ophthalmic solution Place 1 drop into both eyes 2 (two) times daily.    [provider]  carvedilol (COREG) 12.5 MG tablet Take 1 tablet (12.5 mg total) by mouth 2 (two) times daily with a meal. 04/02/22   [name], Loura Halt, MD  docusate sodium (COLACE) 100 MG capsule Take 100 mg by mouth at bedtime.    [provider]  dorzolamide (TRUSOPT) 2 % ophthalmic solution Place 1 drop into both eyes 2 (two) times daily.    [provider]  ferrous sulfate 325 (65 FE) MG tablet Take 1 tablet (325 mg total) by mouth daily. 09/23/20   Marguerita Merles [place],  DO  hydrALAZINE (APRESOLINE) 50 MG tablet Take 1 tablet (50 mg total) by mouth every 8 (eight) hours. Patient taking differently: Take 50 mg by mouth See admin instructions. Take 50 mg by mouth at 4 AM, 10 AM, 4 PM, and 10 PM- hold for a Systolic reading less than 100 04/02/22   [name], [name], MD  insulin aspart (NOVOLOG FLEXPEN) 100 UNIT/ML FlexPen Inject 2-10 Units into the skin See admin instructions. Inject 2-10 units into the skin three times a day before meals, per sliding scale: BGL 131-180 = 2 units; 181-240 = 4 units; 241-300 = 6 units; 301-350 = 8 units; 351-400 = 10 units; >400 = CALL MD    [provider]  LANTUS SOLOSTAR 100 UNIT/ML Solostar Pen Inject 10 Units into the skin in the morning.    [provider]  latanoprost (XALATAN) 0.005 % ophthalmic solution Place 1 drop into both eyes at bedtime.    [provider]  Multiple Vitamin (MULTIVITAMIN) tablet Take 1 tablet by mouth See admin instructions. Take 1 tablet by  mouth at 9 AM and 10 AM    [provider]  Nutritional Supplements (FEEDING SUPPLEMENT, NEPRO CARB STEADY,) LIQD Take 237 mLs by mouth at bedtime. Patient not taking: Reported on 07/17/2022 10/31/21   Alfredo Martinez, MD  ondansetron (ZOFRAN) 4 MG tablet Take 4 mg by mouth every 4 (four) hours as needed for nausea or vomiting.    [provider]  OXYGEN Inhale 2 L/min into the lungs as needed (for sats below 90%).    [provider]  pantoprazole (PROTONIX) 40 MG tablet Take 1 tablet (40 mg total) by mouth 2 (two) times daily. 07/20/22   [name], [name], DO  potassium chloride SA (KLOR-CON M) 20 MEQ tablet Take 20 mEq by mouth in the morning.    [provider]  pregabalin (LYRICA) 75 MG capsule Take 1 capsule (75 mg total) by mouth daily. 10/31/21   Alfredo Martinez, MD  PROTEIN PO Take 30 mLs by mouth 2 (two) times daily.    [provider]  saccharomyces boulardii (FLORASTOR) 250 MG capsule Take 1 capsule (250 mg total) by mouth 2 (two) times daily. 09/07/20   Lanae Boast, MD  sertraline (ZOLOFT) 50 MG tablet Take 50 mg by mouth every other day.    [provider]  sodium bicarbonate 650 MG tablet Take 2 tablets (1,300 mg total) by mouth 2 (two) times daily. Patient taking differently: Take 1,950 mg by mouth 2 (two) times daily. 10/31/21   Alfredo Martinez, MD  tamsulosin (FLOMAX) 0.4 MG CAPS capsule Take 0.8 mg by mouth at bedtime.    [provider]  torsemide (DEMADEX) 20 MG tablet Take 40-80 mg by mouth See admin instructions. Take 80 mg by mouth at 8 AM and 40 mg at 7 PM    [provider]  Vitamin D, Ergocalciferol, (DRISDOL) 1.25 MG (50000 UNIT) CAPS capsule Take 50,000 Units by mouth every 30 (thirty) days. 17th of each month    [provider]  zinc sulfate 220 (50 Zn) MG capsule Take 220 mg by mouth daily.    [provider]    Physical Exam: Vitals:   08/08/22 2021 08/08/22 2143 08/08/22 2145 08/08/22 2200   BP:  (!) 144/87 (!) 141/83 (!) 145/90  Pulse: 68 69 67 68  Resp:  18    Temp: 98.7 F (37.1 C)     SpO2: 92% 97% 100% 99%  Constitutional: NAD, no pallor or diaphoresis   Eyes: PERTLA, lids and conjunctivae normal, opaque lens on right ENMT: Mucous membranes are moist. Posterior pharynx clear of any exudate or lesions.   Neck: supple, no masses  Respiratory: Fine rales b/l. Speaking full sentences. No accessory muscle use.  Cardiovascular: S1 & S2 heard, regular rate and rhythm. Pretibial pitting edema.  Abdomen: no tenderness, soft. Bowel sounds active.  Musculoskeletal: no clubbing / cyanosis. S/p left BKA.   Skin: no significant rashes, lesions, ulcers. Warm, dry, well-perfused. Neurologic: CN 2-12 grossly intact. Moving all extremities. Alert and oriented.  Psychiatric: Calm. Cooperative.    Labs and Imaging on Admission: I have personally reviewed following labs and imaging studies  CBC: Recent Labs  Lab 08/08/22 2028  WBC 1.9*  NEUTROABS 1.0*  HGB 6.9*  HCT 22.1*  MCV 90.2  PLT 57*   Basic Metabolic Panel: Recent Labs  Lab 08/08/22 2028  NA 133*  K 3.8  CL 102  CO2 20*  GLUCOSE 131*  BUN 80*  CREATININE 5.26*  CALCIUM 7.8*   GFR: CrCl cannot be calculated (Unknown ideal weight.). Liver Function Tests: Recent Labs  Lab 08/08/22 2028  AST 28  ALT 27  ALKPHOS 73  BILITOT 0.6  PROT 7.7  ALBUMIN 2.8*   No results for input(s): "LIPASE", "AMYLASE" in the last 168 hours. No results for input(s): "AMMONIA" in the last 168 hours. Coagulation Profile: No results for input(s): "INR", "PROTIME" in the last 168 hours. Cardiac Enzymes: No results for input(s): "CKTOTAL", "CKMB", "CKMBINDEX", "TROPONINI" in the last 168 hours. BNP (last 3 results) No results for input(s): "PROBNP" in the last 8760 hours. HbA1C: No results for input(s): "HGBA1C" in the last 72 hours. CBG: No results for input(s): "GLUCAP" in the last 168 hours. Lipid Profile: No  results for input(s): "CHOL", "HDL", "LDLCALC", "TRIG", "CHOLHDL", "LDLDIRECT" in the last 72 hours. Thyroid Function Tests: No results for input(s): "TSH", "T4TOTAL", "FREET4", "T3FREE", "THYROIDAB" in the last 72 hours. Anemia Panel: No results for input(s): "VITAMINB12", "FOLATE", "FERRITIN", "TIBC", "IRON", "RETICCTPCT" in the last 72 hours. Urine analysis:    Component Value Date/Time   COLORURINE STRAW (A) 03/20/2022 0817   APPEARANCEUR CLEAR 03/20/2022 0817   APPEARANCEUR Clear 12/31/2013 2116   LABSPEC 1.009 03/20/2022 0817   LABSPEC 1.032 12/31/2013 2116   PHURINE 5.0 03/20/2022 0817   GLUCOSEU 50 (A) 03/20/2022 0817   GLUCOSEU >=500 12/31/2013 2116   HGBUR NEGATIVE 03/20/2022 0817   BILIRUBINUR NEGATIVE 03/20/2022 0817   BILIRUBINUR Negative 12/31/2013 2116   KETONESUR NEGATIVE 03/20/2022 0817   PROTEINUR >=300 (A) 03/20/2022 0817   UROBILINOGEN 1.0 08/28/2013 1709   NITRITE NEGATIVE 03/20/2022 0817   LEUKOCYTESUR NEGATIVE 03/20/2022 0817   LEUKOCYTESUR Negative 12/31/2013 2116   Sepsis Labs: @LABRCNTIP (procalcitonin:4,lacticidven:4) )No results found for this or any previous visit (from the past 240 hour(s)).   Radiological Exams on Admission: DG Chest 2 View  Result Date: 08/08/2022 CLINICAL DATA:  [id number] with dyspnea. EXAM: CHEST - 2 VIEW COMPARISON:  Portable chest 07/17/2022, CT chest no contrast 03/17/2022 FINDINGS: The heart is moderately enlarged. Perihilar vascular congestion and edema are again noted. There is patchy airspace disease in the lung fields which could be airspace edema, pneumonia or combination. There are small layering pleural effusions. Mediastinum is normally outlined. There are degenerative changes and mild dextroscoliosis of the thoracic spine. Similar findings were noted on both prior studies. IMPRESSION: 1. Cardiomegaly with perihilar vascular congestion and edema. 2. Patchy airspace disease in  the lung fields which could be airspace edema,  pneumonia or combination. 3. Small layering pleural effusions. 4. Clinical correlation and radiographic follow-up recommended. Electronically Signed   By: Almira Bar M.D.   On: 08/08/2022 21:18     Assessment/Plan   1. AKI superimposed on CKD IV  - BUN is 80 and SCr 5.26 on admission, up from 61 and 3.74 on 07/20/22  - He is hypervolemic with pulmonary edema and new supplemental O2 requirement but not in any respiratory distress  - Renally-dose medications, continue bicarbonate, diurese with IV Lasix, repeat chem panel in am    2. Pancytopenia  - WBC 1,900, platelets 57,000, and Hgb 6.9 on admission with negative FOBT  - Pathologist smear review requested, anemia panel ordered, transfuse 1 unit RBC, repeat CBC in am    3. Acute on chronic HFpEF; acute hypoxic respiratory failure  - EF was 50-55% in January 2024   - Check EKG, diurese with 60 mg IV Lasix q12h, to be adjusted as needed according to response, monitor weight and I/Os    4. Type II DM  - A1c was 6.1% in January 2024  - Check CBGs and use low-intensity SSI only for now given decreased GFR    5. Hypertension  - Continue Norvasc, Coreg, and hydralazine   6. Hx of CVA  - Continue ASA and Lipitor    DVT prophylaxis: SCD  Code Status: DNR  Level of Care: Level of care: Progressive Family Communication: none present  Disposition Plan:  Patient is from: SNF  Anticipated d/c is to: SNF  Anticipated d/c date is: 08/13/22  Patient currently: pending improved/stable H&H and renal function, stable respiratory & volume status  Consults called: Nephrology  Admission status: inpatient     Briscoe Deutscher, MD [facility]  08/08/2022, 10:41 PM

## 2022-08-08 NOTE — ED Provider Notes (Deleted)
[place] EMERGENCY DEPARTMENT AT [facility] Provider Note   CSN: [id number] Arrival date & time: 08/08/22  2008     History  Chief Complaint  Patient presents with   abnormal labs     [name] is a 62 y.o. male with multiple co morbidities presenting with Hgb < 7.0 from long-term care facility. No dizziness or shortness of breath. No chest pain. Last BM a couple days ago, unsure if there was any blood in it.  No nausea, vomiting.  Recently admitted from 5/22-5/25 for CHF exacerbation and found to have acute blood loss anemia secondary to suspected GI bleed. At that time, FOBT+ and received 3u pRBC during hospitalization. GI consulted and did not have indication for endoscopic procedure due to stable Hgb with recommendation for repeat CBC in 1 week.  Past Medical History:  Diagnosis Date   Acute on chronic diastolic CHF (congestive heart failure) (HCC) 08/22/2020   Acute on chronic heart failure with preserved ejection fraction (HCC)    Acute renal failure superimposed on stage 3a chronic kidney disease (HCC) 11/18/2020   Arthritis    Bacteremia due to Enterococcus 12/09/2021   Bipolar affective (HCC)    CHF (congestive heart failure) (HCC)    Claustrophobia    Severe   Cocaine abuse (HCC) 05/31/2014   Cocaine dependence, uncomplicated (HCC) 05/31/2014   Depression    Diabetes mellitus    uncontrolled    Elevated CK    Heart murmur    Hypertension    Ileus (HCC) 11/18/2020   Metabolic acidosis 10/23/2021   Non-traumatic rhabdomyolysis 01/01/2020   Pericardial effusion 08/22/2020   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 Authorizing Provider  acetaminophen (TYLENOL) 650 MG CR tablet Take 650 mg by mouth every 4 (four) hours as needed (for discomfort- cannot exceed 3,000 mg from all  combined sources/24 hours).    [provider]  albuterol (VENTOLIN HFA) 108 (90 Base) MCG/ACT inhaler Inhale 2 puffs into the lungs every 6 (six) hours as needed for wheezing or shortness of breath.    [provider]  amLODipine (NORVASC) 10 MG tablet Take 1 tablet (10 mg total) by mouth daily. Patient taking differently: Take 10 mg by mouth See admin instructions. Take 10 mg by mouth once a day and hold if Systolic is <100 or Diastolic <78. Call NP is B/P is over 160/90. 12/11/21   [name], [name], DO  ascorbic acid (VITAMIN C) 500 MG tablet Take 500 mg by mouth 2 (two) times daily.    [provider]  aspirin 81 MG chewable tablet Chew 81 mg by mouth daily.    [provider]  atorvastatin (LIPITOR) 80 MG tablet Take 80 mg by mouth at bedtime.    [provider]  brimonidine (ALPHAGAN) 0.2 % ophthalmic solution Place 1 drop into both eyes 2 (two) times daily.    [provider]  carvedilol (COREG) 12.5 MG tablet Take 1 tablet (12.5 mg total) by mouth 2 (two) times daily with a meal. 04/02/22   [name], Loura Halt, MD  docusate sodium (COLACE) 100 MG capsule Take 100 mg by mouth at bedtime.    [provider]  dorzolamide (TRUSOPT) 2 % ophthalmic solution Place 1 drop into both eyes 2 (two) times daily.    [provider]  ferrous sulfate 325 (65 FE) MG tablet Take 1 tablet (325 mg total) by mouth daily. 09/23/20   Marguerita Merles Latif, DO  hydrALAZINE (APRESOLINE) 50 MG tablet Take 1 tablet (50 mg total) by mouth every 8 (eight) hours. Patient taking differently: Take 50 mg by mouth See admin instructions. Take 50 mg by mouth at 4 AM, 10 AM, 4 PM, and 10 PM- hold for a Systolic reading less than 100 04/02/22   [name], [name], MD  insulin aspart (NOVOLOG FLEXPEN) 100 UNIT/ML FlexPen Inject 2-10 Units into the skin See admin instructions. Inject 2-10 units into the skin three times a day before meals, per sliding scale: BGL 131-180  = 2 units; 181-240 = 4 units; 241-300 = 6 units; 301-350 = 8 units; 351-400 = 10 units; >400 = CALL MD    [provider]  LANTUS SOLOSTAR 100 UNIT/ML Solostar Pen Inject 10 Units into the skin in the morning.    [provider]  latanoprost (XALATAN) 0.005 % ophthalmic solution Place 1 drop into both eyes at bedtime.    [provider]  Multiple Vitamin (MULTIVITAMIN) tablet Take 1 tablet by mouth See admin instructions. Take 1 tablet by mouth at 9 AM and 10 AM    [provider]  Nutritional Supplements (FEEDING SUPPLEMENT, NEPRO CARB STEADY,) LIQD Take 237 mLs by mouth at bedtime. Patient not taking: Reported on 07/17/2022 10/31/21   Alfredo Martinez, MD  ondansetron (ZOFRAN) 4 MG tablet Take 4 mg by mouth every 4 (four) hours as needed for nausea or vomiting.    [provider]  OXYGEN Inhale 2 L/min into the lungs as needed (for sats below 90%).    [provider]  pantoprazole (PROTONIX) 40 MG tablet Take 1 tablet (40 mg total) by mouth 2 (two) times daily. 07/20/22   [name], [name], DO  potassium chloride SA (KLOR-CON M) 20 MEQ tablet Take 20 mEq by mouth in the morning.    [provider]  pregabalin (LYRICA) 75 MG capsule Take 1 capsule (75 mg total) by mouth daily. 10/31/21   Alfredo Martinez, MD  PROTEIN PO Take 30 mLs by mouth 2 (two) times daily.    [provider]  saccharomyces boulardii (FLORASTOR) 250 MG capsule Take 1 capsule (250 mg total) by mouth 2 (two) times daily. 09/07/20   Lanae Boast, MD  sertraline (ZOLOFT) 50 MG tablet Take 50 mg by mouth every other day.    [provider]  sodium bicarbonate 650 MG tablet Take 2 tablets (1,300 mg total) by mouth 2 (two) times daily. Patient taking differently: Take 1,950 mg by mouth 2 (two) times daily. 10/31/21   Alfredo Martinez, MD  tamsulosin (FLOMAX) 0.4 MG CAPS capsule Take 0.8 mg by mouth at bedtime.    [provider]  torsemide (DEMADEX) 20 MG tablet  Take 40-80 mg by mouth See admin instructions. Take 80 mg by mouth at 8 AM and 40 mg at 7 PM    [provider]  Vitamin D, Ergocalciferol, (DRISDOL) 1.25 MG (50000 UNIT) CAPS capsule Take 50,000 Units by mouth every 30 (thirty) days. 17th of each month    [provider]  zinc sulfate 220 (50 Zn) MG capsule Take 220 mg by mouth daily.    [provider]      Allergies    Sertraline    Review of Systems   Review of Systems  Constitutional:  Negative for chills and fever.    Physical  Exam Updated Vital Signs BP (!) 145/90   Pulse 68   Temp 98.7 F (37.1 C)   Resp 18   SpO2 99%  Physical Exam Constitutional:      Comments: Chronically ill-appearing  HENT:     Mouth/Throat:     Mouth: Mucous membranes are moist.     Pharynx: Oropharynx is clear.     Comments: Edentulous upper and lower Eyes:     Pupils: Pupils are equal, round, and reactive to light.     Comments: Right eye with cataract  Cardiovascular:     Rate and Rhythm: Normal rate and regular rhythm.  Pulmonary:     Comments: Mildly increased work of breathing, on 2 L nasal cannula Neurological:     Mental Status: He is alert.     ED Results / Procedures / Treatments   Labs (all labs ordered are listed, but only abnormal results are displayed) Labs Reviewed  CBC WITH DIFFERENTIAL/PLATELET - Abnormal; Notable for the following components:      Result Value   WBC 1.9 (*)    RBC 2.45 (*)    Hemoglobin 6.9 (*)    HCT 22.1 (*)    RDW 18.3 (*)    Platelets 57 (*)    Neutro Abs 1.0 (*)    Lymphs Abs 0.4 (*)    Monocytes Absolute 0.0 (*)    Abs Immature Granulocytes 0.40 (*)    All other components within normal limits  COMPREHENSIVE METABOLIC PANEL - Abnormal; Notable for the following components:   Sodium 133 (*)    CO2 20 (*)    Glucose, Bld 131 (*)    BUN 80 (*)    Creatinine, Ser 5.26 (*)    Calcium 7.8 (*)    Albumin 2.8 (*)    GFR, Estimated 12 (*)    All other components  within normal limits  BRAIN NATRIURETIC PEPTIDE - Abnormal; Notable for the following components:   B Natriuretic Peptide 498.7 (*)    All other components within normal limits  URINALYSIS, ROUTINE W REFLEX MICROSCOPIC  PATHOLOGIST SMEAR REVIEW  POC OCCULT BLOOD, ED  TYPE AND SCREEN  TROPONIN I (HIGH SENSITIVITY)    EKG None  Radiology DG Chest 2 View  Result Date: 08/08/2022 CLINICAL DATA:  [id number] with dyspnea. EXAM: CHEST - 2 VIEW COMPARISON:  Portable chest 07/17/2022, CT chest no contrast 03/17/2022 FINDINGS: The heart is moderately enlarged. Perihilar vascular congestion and edema are again noted. There is patchy airspace disease in the lung fields which could be airspace edema, pneumonia or combination. There are small layering pleural effusions. Mediastinum is normally outlined. There are degenerative changes and mild dextroscoliosis of the thoracic spine. Similar findings were noted on both prior studies. IMPRESSION: 1. Cardiomegaly with perihilar vascular congestion and edema. 2. Patchy airspace disease in the lung fields which could be airspace edema, pneumonia or combination. 3. Small layering pleural effusions. 4. Clinical correlation and radiographic follow-up recommended. Electronically Signed   By: Almira Bar M.D.   On: 08/08/2022 21:18    Procedures Procedures    Medications Ordered in ED Medications - No data to display  ED Course/ Medical Decision Making/ A&P                             Medical Decision Making 62 year old male with multiple comorbidities but notably history of CKD stage IV, acute on chronic CHF  presenting with report of Hgb <7.0  from care facility. Labs reveal worsening renal function, now CKD stage V with creatinine 5.26 and GFR 12 with BUN 80.  No symptoms of uremia. Did not receive PRBC transfusion in ED as he is hemodynamically stable and largely asymptomatic at this time.  Likely underlying anemia of chronic disease.  No active bleeding,  FOBT negative. Requires admission for worsening renal function requiring monitoring and hypervolemia (anasarca) with new oxygen requirement.  Chest x-ray with vascular congestion and edema, with patchy airspace disease possibly due to edema versus pneumonia.  Would likely benefit from IV diuresis. Discussed with [facility] hospitalist who will evaluate the patient for admission.  Amount and/or Complexity of Data Reviewed Labs: ordered. Radiology: ordered.  Risk Decision regarding hospitalization.           Final Clinical Impression(s) / ED Diagnoses Final diagnoses:  None    Rx / DC Orders ED Discharge Orders     None         Darral Dash, DO 08/08/22 2236    Darral Dash, DO 08/08/22 2238

## 2022-08-08 NOTE — ED Notes (Addendum)
ED TO INPATIENT HANDOFF REPORT  Name/Age/Gender [name] 62 y.o. male  Code Status    Code Status Orders  (From admission, onward)           Start     Ordered   08/08/22 2241  Full code  Continuous       Question:  By:  Answer:  Consent: discussion documented in EHR   08/08/22 2241           Code Status History     Date Active Date Inactive Code Status Order ID Comments User Context   07/17/2022 2151 07/21/2022 0208 Full Code [id number]  Evonte Giovanni, MD ED   03/31/2022 1127 04/02/2022 2209 DNR [id number]  Alena Bills, DO Inpatient   03/17/2022 2134 03/31/2022 1127 Full Code [id number]  Carollee Herter, DO ED   03/17/2022 2101 03/17/2022 2134 Full Code [id number]  Carollee Herter, DO ED   12/08/2021 2207 12/11/2021 1757 Full Code [id number]  Charlsie Quest, MD ED   10/19/2021 1602 11/01/2021 2202 Full Code [id number]  Alicia Amel, MD ED   03/01/2021 1507 03/13/2021 0408 Full Code [id number]  Jonah Blue, MD Inpatient   11/18/2020 2041 12/12/2020 0309 Full Code [id number]  Anselm Jungling, DO ED   08/22/2020 0531 09/22/2020 1954 Full Code [id number]  Hillary Bow, DO ED   06/30/2020 1243 07/05/2020 2345 Full Code [id number]  Joseph Art, DO ED   06/25/2020 0944 06/28/2020 2024 Full Code [id number]  Clydie Braun, MD ED   01/17/2020 1708 01/31/2020 1753 DNR [id number]  Ernie Avena, NP Inpatient   12/31/2019 2311 01/17/2020 1708 Full Code [id number]  Briscoe Deutscher, MD ED   12/31/2017 1739 01/02/2018 1315 Full Code [id number]  [name], Leana Roe, MD Inpatient   09/28/2017 1434 10/01/2017 1948 Full Code [id number]  [name], Bethany, DO ED   07/30/2017 2111 08/04/2017 1746 Full Code [id number]  Rankin, [name], NP Inpatient   07/29/2017 2347 07/30/2017 2033 Full Code [id number]  Bethel Born, PA-C ED   01/08/2015 2013 01/09/2015 2017 Full Code [id number]  Shari Prows, MD Inpatient   01/04/2015 1915 01/08/2015 2013 Full Code [id number]  Audery Amel, MD Inpatient   12/01/2014 1735  12/02/2014 1242 Full Code [id number]  Eber Hong, MD ED   08/28/2013 1748 08/31/2013 1627 Full Code [id number]  Nanine Means, NP Inpatient   08/28/2013 1301 08/28/2013 1748 Full Code [id number]  Gilda Crease., MD ED   05/10/2011 2141 05/12/2011 1747 Full Code [id number]  Jaci Carrel, PA-C ED       Home/SNF/Other Home  Chief Complaint Acute renal failure superimposed on stage 4 chronic kidney disease (HCC) [N17.9, N18.4]  Level of Care/Admitting Diagnosis ED Disposition     ED Disposition  Admit   Condition  --   Comment  Hospital Area: [facility] [[id number]]  Level of Care: Progressive [102]  Admit to Progressive based on following criteria: NEPHROLOGY stable condition requiring close monitoring for AKI, requiring Hemodialysis or Peritoneal Dialysis either from expected electrolyte imbalance, acidosis, or fluid overload that can be managed by NIPPV or high flow oxygen.  May admit patient to [facility] or [facility] if equivalent level of care is available:: No  Covid Evaluation: Asymptomatic - no recent exposure (last 10 days) testing not required  Diagnosis: Acute renal failure superimposed on stage 4 chronic kidney disease [facility]) [[id number]]  Admitting Physician: Briscoe Deutscher [[id number]]  Attending Physician: Briscoe Deutscher [[id number]]  Certification:: I  certify this patient will need inpatient services for at least 2 midnights  Estimated Length of Stay: 3          Medical History Past Medical History:  Diagnosis Date   Acute on chronic diastolic CHF (congestive heart failure) (HCC) 08/22/2020   Acute on chronic heart failure with preserved ejection fraction (HCC)    Acute renal failure superimposed on stage 3a chronic kidney disease (HCC) 11/18/2020   Arthritis    Bacteremia due to Enterococcus 12/09/2021   Bipolar affective (HCC)    CHF (congestive heart failure) (HCC)    Claustrophobia    Severe   Cocaine abuse (HCC) 05/31/2014   Cocaine dependence,  uncomplicated (HCC) 05/31/2014   Depression    Diabetes mellitus    uncontrolled    Elevated CK    Heart murmur    Hypertension    Ileus (HCC) 11/18/2020   Metabolic acidosis 10/23/2021   Non-traumatic rhabdomyolysis 01/01/2020   Pericardial effusion 08/22/2020   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   Suicidal ideations 08/28/2013    Allergies Allergies  Allergen Reactions   Sertraline Other (See Comments)    Reaction not known    IV Location/Drains/Wounds Patient Lines/Drains/Airways Status     Active Line/Drains/Airways     Name Placement date Placement time Site Days   Peripheral IV 08/06/22 Left Antecubital 08/06/22  2030  Antecubital  2   External Urinary Catheter 07/17/22  2301  --  22   Wound / Incision (Open or Dehisced) 03/18/22 Other (Comment) Leg Left 03/18/22  --  Leg  143            Labs/Imaging Results for orders placed or performed during the hospital encounter of 08/08/22 (from the past 48 hour(s))  CBC with Differential     Status: Abnormal   Collection Time: 08/08/22  8:28 PM  Result Value Ref Range   WBC 1.9 (L) 4.0 - 10.5 K/uL   RBC 2.45 (L) 4.22 - 5.81 MIL/uL   Hemoglobin 6.9 (LL) 13.0 - 17.0 g/dL    Comment: This critical result has verified and been called to Melene Plan, RN by Jackquline Bosch on 06 13 2024 at 2131, and has been read back.    HCT 22.1 (L) 39.0 - 52.0 %   MCV 90.2 80.0 - 100.0 fL   MCH 28.2 26.0 - 34.0 pg   MCHC 31.2 30.0 - 36.0 g/dL   RDW 16.1 (H) 09.6 - 04.5 %   Platelets 57 (L) 150 - 400 K/uL    Comment: Immature Platelet Fraction may be clinically indicated, consider ordering this additional test [id number]    nRBC 0.0 0.0 - 0.2 %   Neutrophils Relative % 46 %   Neutro Abs 1.0 (L) 1.7 - 7.7 K/uL   Band Neutrophils 8 %   Lymphocytes Relative 19 %   Lymphs Abs 0.4 (L) 0.7 - 4.0 K/uL   Monocytes Relative 0 %   Monocytes Absolute 0.0 (L) 0.1 - 1.0 K/uL   Eosinophils Relative 2 %    Eosinophils Absolute 0.0 0.0 - 0.5 K/uL   Basophils Relative 3 %   Basophils Absolute 0.1 0.0 - 0.1 K/uL   WBC Morphology Moderate Left Shift (>5% metas and myelos)    Metamyelocytes Relative 12 %   Myelocytes 10 %   Abs Immature Granulocytes 0.40 (H) 0.00 - 0.07 K/uL   Abnormal Lymphocytes Present PRESENT    Schistocytes PRESENT    Ovalocytes PRESENT  Comment: Performed at [facility], [address], [place], [place] [address]  Comprehensive metabolic panel     Status: Abnormal   Collection Time: 08/08/22  8:28 PM  Result Value Ref Range   Sodium 133 (L) 135 - 145 mmol/L   Potassium 3.8 3.5 - 5.1 mmol/L   Chloride 102 98 - 111 mmol/L   CO2 20 (L) 22 - 32 mmol/L   Glucose, Bld 131 (H) 70 - 99 mg/dL    Comment: Glucose reference range applies only to samples taken after fasting for at least 8 hours.   BUN 80 (H) 8 - 23 mg/dL   Creatinine, Ser 2.44 (H) 0.61 - 1.24 mg/dL   Calcium 7.8 (L) 8.9 - 10.3 mg/dL   Total Protein 7.7 6.5 - 8.1 g/dL   Albumin 2.8 (L) 3.5 - 5.0 g/dL   AST 28 15 - 41 U/L   ALT 27 0 - 44 U/L   Alkaline Phosphatase 73 38 - 126 U/L   Total Bilirubin 0.6 0.3 - 1.2 mg/dL   GFR, Estimated 12 (L) >60 mL/min    Comment: (NOTE) Calculated using the CKD-EPI Creatinine Equation (2021)    Anion gap 11 5 - 15    Comment: Performed at [facility], [address], [place], [place] [address]  Type and screen [facility]     Status: None (Preliminary result)   Collection Time: 08/08/22  8:29 PM  Result Value Ref Range   ABO/RH(D) O POS    Antibody Screen NEG    Sample Expiration      08/11/2022,2359 Performed at [facility], [address] [place], [place] [address]    Unit Number [id number]    Blood Component Type RED CELLS,LR    Unit division 00    Status of Unit ALLOCATED    Transfusion Status OK TO TRANSFUSE    Crossmatch Result Compatible   Brain natriuretic peptide      Status: Abnormal   Collection Time: 08/08/22  9:49 PM  Result Value Ref Range   B Natriuretic Peptide 498.7 (H) 0.0 - 100.0 pg/mL    Comment: Performed at [facility], [address] [place], [place] [address]  POC occult blood, ED     Status: None   Collection Time: 08/08/22 10:09 PM  Result Value Ref Range   Fecal Occult Bld NEGATIVE NEGATIVE  Prepare RBC (crossmatch)     Status: None   Collection Time: 08/08/22 10:40 PM  Result Value Ref Range   Order Confirmation      ORDER PROCESSED BY BLOOD BANK Performed at [facility], [address] [place], [place] [address]   CBG monitoring, ED     Status: Abnormal   Collection Time: 08/08/22 11:17 PM  Result Value Ref Range   Glucose-Capillary 123 (H) 70 - 99 mg/dL    Comment: Glucose reference range applies only to samples taken after fasting for at least 8 hours.   DG Chest 2 View  Result Date: 08/08/2022 CLINICAL DATA:  [id number] with dyspnea. EXAM: CHEST - 2 VIEW COMPARISON:  Portable chest 07/17/2022, CT chest no contrast 03/17/2022 FINDINGS: The heart is moderately enlarged. Perihilar vascular congestion and edema are again noted. There is patchy airspace disease in the lung fields which could be airspace edema, pneumonia or combination. There are small layering pleural effusions. Mediastinum is normally outlined. There are degenerative changes and mild dextroscoliosis of the thoracic spine. Similar findings were  noted on both prior studies. IMPRESSION: 1. Cardiomegaly with perihilar vascular congestion and edema. 2. Patchy airspace disease in the lung fields which could be airspace edema, pneumonia or combination. 3. Small layering pleural effusions. 4. Clinical correlation and radiographic follow-up recommended. Electronically Signed   By: Almira Bar M.D.   On: 08/08/2022 21:18    Pending Labs Unresulted Labs (From admission, onward)     Start     Ordered   08/09/22 0500  Basic metabolic  panel  Daily,   R      08/08/22 2241   08/09/22 0500  Magnesium  Tomorrow morning,   R        08/08/22 2241   08/09/22 0500  Phosphorus  Tomorrow morning,   R        08/08/22 2241   08/09/22 0500  CBC  Daily,   R      08/08/22 2241   08/08/22 2311  Sodium, urine, random  Once,   R        08/08/22 2310   08/08/22 2311  Creatinine, urine, random  Once,   R        08/08/22 2310   08/08/22 2311  Urea nitrogen, urine  Once,   R        08/08/22 2310   08/08/22 2242  Vitamin B12  (Anemia Panel (PNL))  Once,   R        08/08/22 2241   08/08/22 2242  Folate  (Anemia Panel (PNL))  Once,   R        08/08/22 2241   08/08/22 2242  Iron and TIBC  (Anemia Panel (PNL))  Once,   R        08/08/22 2241   08/08/22 2242  Ferritin  (Anemia Panel (PNL))  Once,   R        08/08/22 2241   08/08/22 2242  Reticulocytes  (Anemia Panel (PNL))  Once,   R        08/08/22 2241   08/08/22 2121  Urinalysis, Routine w reflex microscopic -Urine, Clean Catch  Once,   URGENT       Question:  Specimen Source  Answer:  Urine, Clean Catch   08/08/22 2120   08/08/22 2028  Pathologist smear review  Once,   STAT        08/08/22 2028            Vitals/Pain Today's Vitals   08/08/22 2145 08/08/22 2200 08/08/22 2300 08/08/22 2315  BP: (!) 141/83 (!) 145/90 (!) 145/87 (!) 148/82  Pulse: 67 68  68  Resp:      Temp:      SpO2: 100% 99%  100%  PainSc:        Isolation Precautions No active isolations  Medications Medications  aspirin chewable tablet 81 mg (has no administration in time range)  amLODipine (NORVASC) tablet 10 mg (has no administration in time range)  atorvastatin (LIPITOR) tablet 80 mg (80 mg Oral Given 08/08/22 2259)  carvedilol (COREG) tablet 12.5 mg (has no administration in time range)  hydrALAZINE (APRESOLINE) tablet 50 mg (50 mg Oral Given 08/08/22 2300)  sertraline (ZOLOFT) tablet 50 mg (has no administration in time range)  pantoprazole (PROTONIX) EC tablet 40 mg (40 mg Oral Given  08/08/22 2301)  sodium bicarbonate tablet 1,950 mg (1,950 mg Oral Given 08/08/22 2302)  albuterol (PROVENTIL) (2.5 MG/3ML) 0.083% nebulizer solution 3 mL (has no administration in time range)  0.9 %  sodium  chloride infusion (Manually program via Guardrails IV Fluids) (has no administration in time range)  insulin aspart (novoLOG) injection 0-6 Units (has no administration in time range)  insulin aspart (novoLOG) injection 0-5 Units ( Subcutaneous Not Given 08/08/22 2319)  furosemide (LASIX) injection 60 mg (60 mg Intravenous Given 08/08/22 2302)  sodium chloride flush (NS) 0.9 % injection 3 mL (3 mLs Intravenous Given 08/08/22 2312)  acetaminophen (TYLENOL) tablet 650 mg (has no administration in time range)    Or  acetaminophen (TYLENOL) suppository 650 mg (has no administration in time range)  HYDROmorphone (DILAUDID) injection 0.5 mg (has no administration in time range)    Mobility wheelchair

## 2022-08-09 DIAGNOSIS — Z8673 Personal history of transient ischemic attack (TIA), and cerebral infarction without residual deficits: Secondary | ICD-10-CM | POA: Diagnosis not present

## 2022-08-09 DIAGNOSIS — D61818 Other pancytopenia: Secondary | ICD-10-CM | POA: Diagnosis not present

## 2022-08-09 DIAGNOSIS — N179 Acute kidney failure, unspecified: Secondary | ICD-10-CM | POA: Diagnosis not present

## 2022-08-09 DIAGNOSIS — I739 Peripheral vascular disease, unspecified: Secondary | ICD-10-CM | POA: Diagnosis not present

## 2022-08-09 LAB — BPAM RBC
Blood Product Expiration Date: 202407182359
ISSUE DATE / TIME: 202406132346
Unit Type and Rh: 5100

## 2022-08-09 LAB — BASIC METABOLIC PANEL
Anion gap: 13 (ref 5–15)
BUN: 74 mg/dL — ABNORMAL HIGH (ref 8–23)
CO2: 19 mmol/L — ABNORMAL LOW (ref 22–32)
Calcium: 8 mg/dL — ABNORMAL LOW (ref 8.9–10.3)
Chloride: 103 mmol/L (ref 98–111)
Creatinine, Ser: 5.09 mg/dL — ABNORMAL HIGH (ref 0.61–1.24)
GFR, Estimated: 12 mL/min — ABNORMAL LOW (ref >60)
Glucose, Bld: 123 mg/dL — ABNORMAL HIGH (ref 70–99)
Potassium: 3.8 mmol/L (ref 3.5–5.1)
Sodium: 135 mmol/L (ref 135–145)

## 2022-08-09 LAB — GLUCOSE, CAPILLARY
Glucose-Capillary: 115 mg/dL — ABNORMAL HIGH (ref 70–99)
Glucose-Capillary: 153 mg/dL — ABNORMAL HIGH (ref 70–99)
Glucose-Capillary: 163 mg/dL — ABNORMAL HIGH (ref 70–99)
Glucose-Capillary: 170 mg/dL — ABNORMAL HIGH (ref 70–99)

## 2022-08-09 LAB — VITAMIN B12: Vitamin B-12: 1139 pg/mL — ABNORMAL HIGH (ref 180–914)

## 2022-08-09 LAB — TYPE AND SCREEN
ABO/RH(D): O POS
Antibody Screen: NEGATIVE

## 2022-08-09 LAB — PATHOLOGIST SMEAR REVIEW

## 2022-08-09 LAB — URINALYSIS, ROUTINE W REFLEX MICROSCOPIC
Bilirubin Urine: NEGATIVE
Glucose, UA: NEGATIVE mg/dL
Hgb urine dipstick: NEGATIVE
Ketones, ur: NEGATIVE mg/dL
Leukocytes,Ua: NEGATIVE
Nitrite: NEGATIVE
Protein, ur: 100 mg/dL — AB
Specific Gravity, Urine: 1.025 (ref 1.005–1.030)
pH: 5.5 (ref 5.0–8.0)

## 2022-08-09 LAB — CBC
HCT: 23.7 % — ABNORMAL LOW (ref 39.0–52.0)
Hemoglobin: 7.6 g/dL — ABNORMAL LOW (ref 13.0–17.0)
MCH: 28.5 pg (ref 26.0–34.0)
MCHC: 32.1 g/dL (ref 30.0–36.0)
MCV: 88.8 fL (ref 80.0–100.0)
Platelets: 52 10*3/uL — ABNORMAL LOW (ref 150–400)
RBC: 2.67 MIL/uL — ABNORMAL LOW (ref 4.22–5.81)
RDW: 18.4 % — ABNORMAL HIGH (ref 11.5–15.5)
WBC: 1.9 10*3/uL — ABNORMAL LOW (ref 4.0–10.5)
nRBC: 0 % (ref 0.0–0.2)

## 2022-08-09 LAB — CREATININE, URINE, RANDOM: Creatinine, Urine: 101 mg/dL

## 2022-08-09 LAB — SODIUM, URINE, RANDOM: Sodium, Ur: 41 mmol/L

## 2022-08-09 LAB — MAGNESIUM: Magnesium: 1.8 mg/dL (ref 1.7–2.4)

## 2022-08-09 LAB — URINALYSIS, MICROSCOPIC (REFLEX): RBC / HPF: NONE SEEN RBC/hpf (ref 0–5)

## 2022-08-09 LAB — FOLATE: Folate: 9.1 ng/mL (ref >5.9)

## 2022-08-09 LAB — FERRITIN: Ferritin: 628 ng/mL — ABNORMAL HIGH (ref 24–336)

## 2022-08-09 LAB — TROPONIN I (HIGH SENSITIVITY): Troponin I (High Sensitivity): 30 ng/L — ABNORMAL HIGH (ref <18)

## 2022-08-09 LAB — IRON AND TIBC
Iron: 72 ug/dL (ref 45–182)
Saturation Ratios: 32 % (ref 17.9–39.5)
TIBC: 228 ug/dL — ABNORMAL LOW (ref 250–450)
UIBC: 156 ug/dL

## 2022-08-09 LAB — PHOSPHORUS: Phosphorus: 5.7 mg/dL — ABNORMAL HIGH (ref 2.5–4.6)

## 2022-08-09 IMAGING — DX DG ABDOMEN 1V
2 series · 2 of 2 positions shown · non-contrast
Comparison: 11/28/2020

CLINICAL DATA: Ileus

EXAM:
ABDOMEN - 1 VIEW

[abdomen kub (1 of 2)]
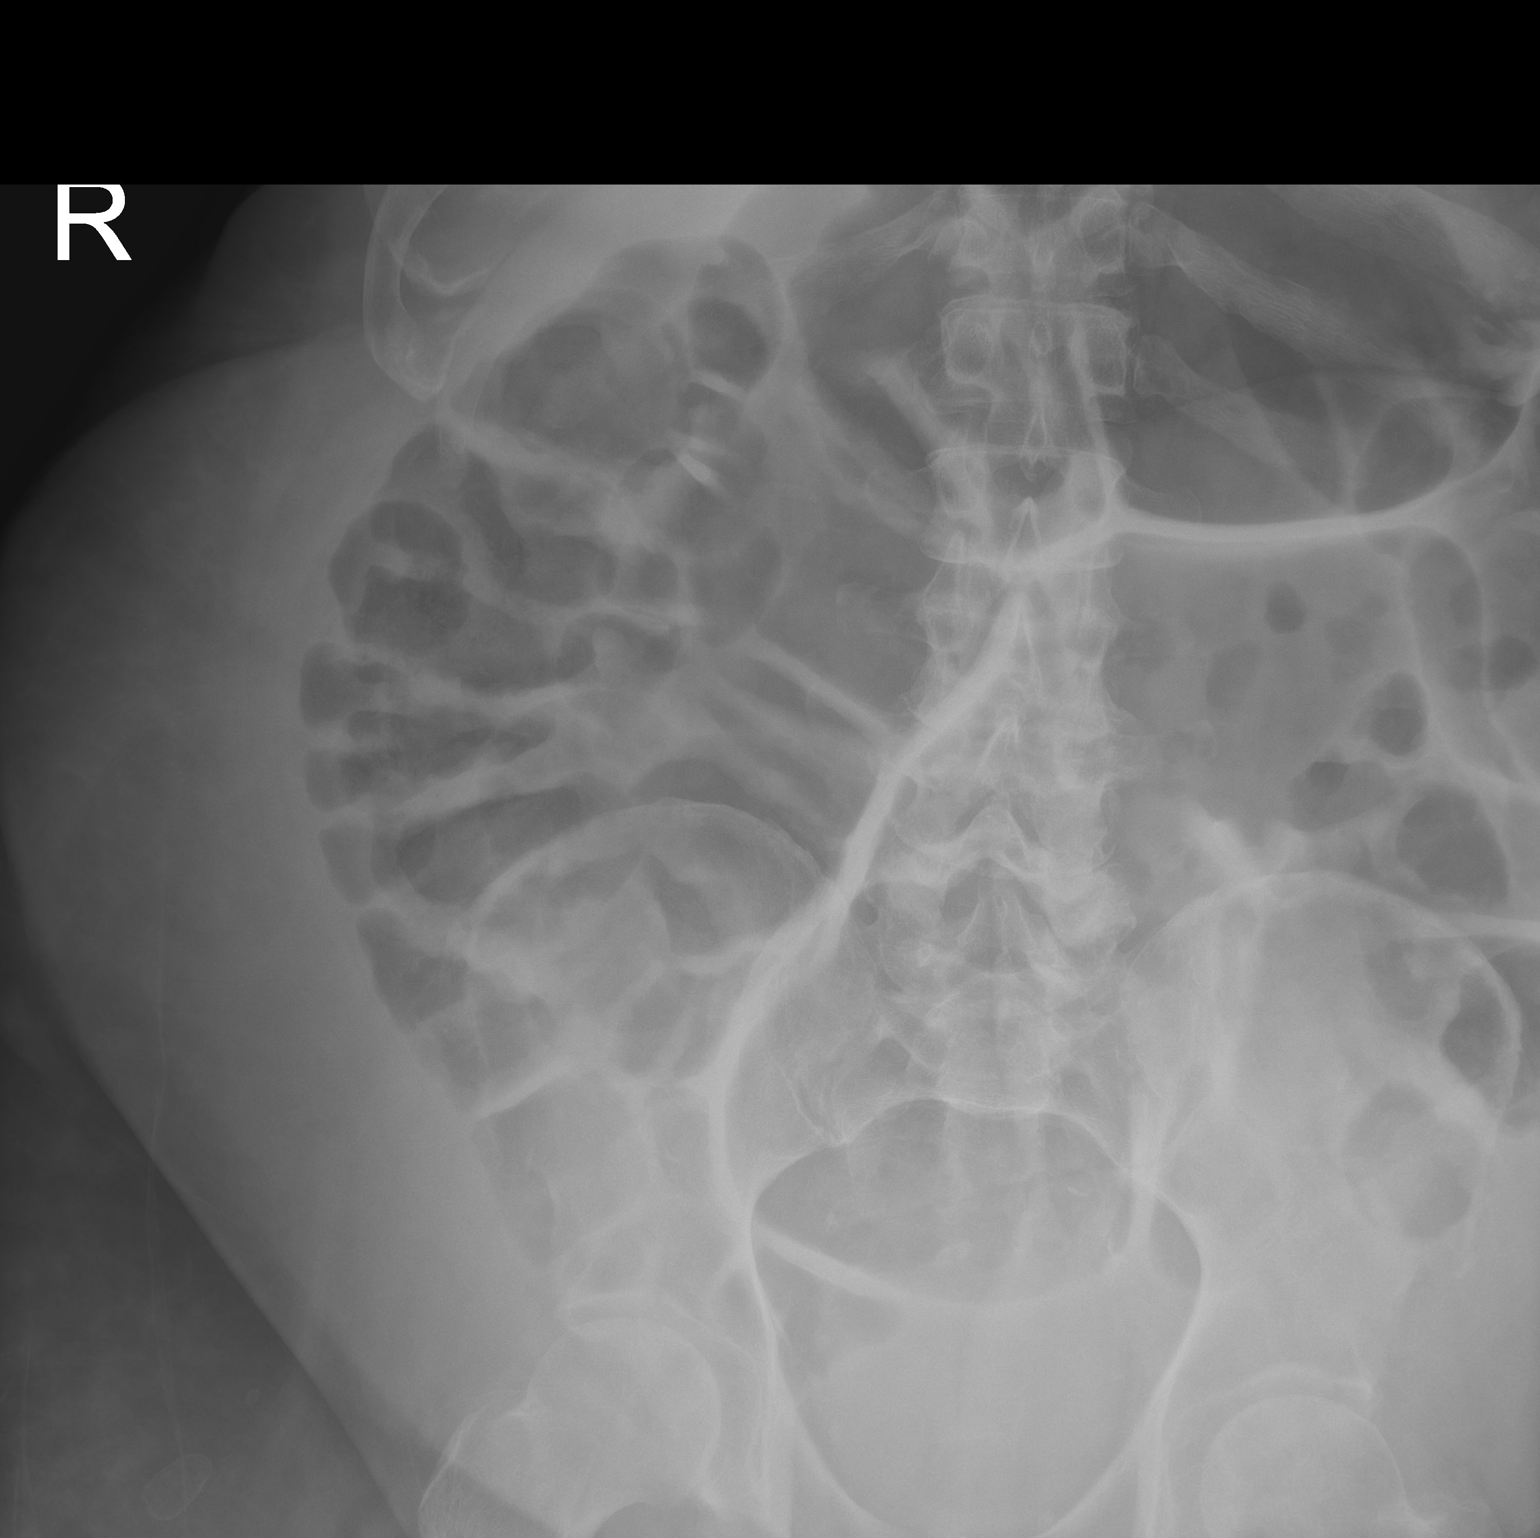

[abdomen kub (2 of 2)]
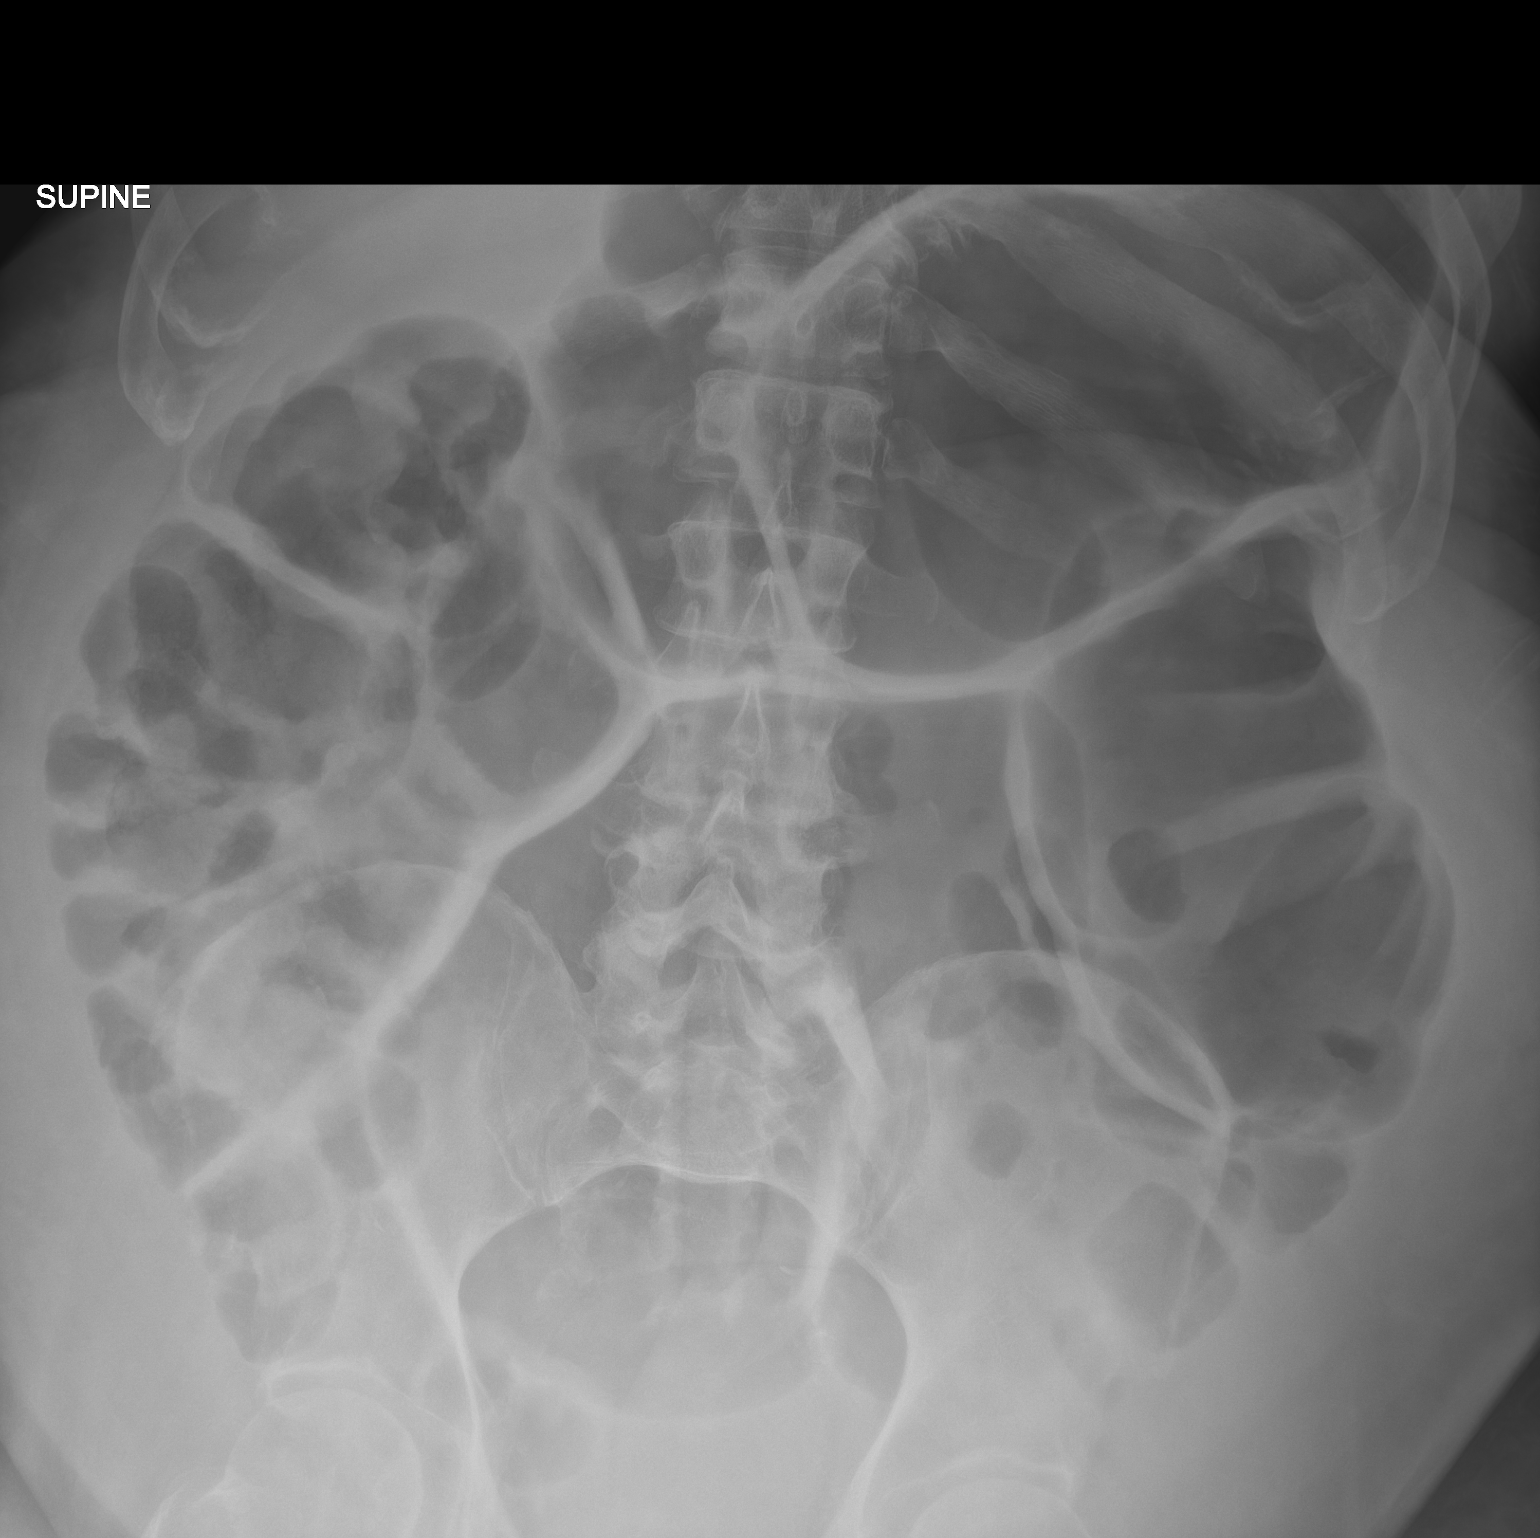

[2 of 2 positions shown; findings below may reference images not displayed]

FINDINGS: Enteric tube is no longer visualized and may be removed or
retracted. Diffuse gaseous distension of the colon is grossly
similar compared to the previous exam. No gross free intraperitoneal
air on AP supine views.
IMPRESSION: 1. Diffuse gaseous distension of the colon is grossly similar
compared to the previous exam.
2. NG tube is no longer visualized.

## 2022-08-09 MED ORDER — WHITE PETROLATUM EX OINT
TOPICAL_OINTMENT | CUTANEOUS | Status: DC | PRN
  Administered 2022-08-09 – 2022-08-24 (×3): 0.2 via TOPICAL
  Filled 2022-08-09: qty 28.35

## 2022-08-09 MED ORDER — ALBUMIN HUMAN 25 % IV SOLN
12.5000 g | Freq: Once | INTRAVENOUS | Status: AC
  Administered 2022-08-10: 12.5 g via INTRAVENOUS
  Filled 2022-08-09: qty 50

## 2022-08-09 MED ORDER — PANTOPRAZOLE SODIUM 40 MG PO TBEC
40.0000 mg | DELAYED_RELEASE_TABLET | Freq: Every day | ORAL | Status: DC
  Administered 2022-08-10 – 2022-08-30 (×16): 40 mg via ORAL
  Filled 2022-08-09 (×20): qty 1

## 2022-08-09 MED ORDER — ENSURE ENLIVE PO LIQD
237.0000 mL | Freq: Two times a day (BID) | ORAL | Status: DC
  Administered 2022-08-09 – 2022-08-16 (×11): 237 mL via ORAL

## 2022-08-09 MED ORDER — ALPRAZOLAM 0.5 MG PO TABS
0.5000 mg | ORAL_TABLET | Freq: Three times a day (TID) | ORAL | Status: DC | PRN
  Administered 2022-08-09 – 2022-09-01 (×6): 0.5 mg via ORAL
  Filled 2022-08-09 (×7): qty 1

## 2022-08-09 MED ORDER — FUROSEMIDE 10 MG/ML IJ SOLN
80.0000 mg | Freq: Once | INTRAMUSCULAR | Status: AC
  Administered 2022-08-09: 80 mg via INTRAVENOUS
  Filled 2022-08-09: qty 8

## 2022-08-09 MED ORDER — FUROSEMIDE 10 MG/ML IJ SOLN
80.0000 mg | Freq: Once | INTRAMUSCULAR | Status: AC
  Administered 2022-08-10: 80 mg via INTRAVENOUS
  Filled 2022-08-09: qty 8

## 2022-08-09 MED ORDER — ALBUMIN HUMAN 25 % IV SOLN
12.5000 g | Freq: Once | INTRAVENOUS | Status: AC
  Administered 2022-08-09: 12.5 g via INTRAVENOUS
  Filled 2022-08-09: qty 50

## 2022-08-09 MED ORDER — ORAL CARE MOUTH RINSE: 15.0000 mL | OROMUCOSAL | Status: DC | PRN

## 2022-08-09 MED ORDER — ADULT MULTIVITAMIN W/MINERALS CH
1.0000 | ORAL_TABLET | Freq: Every day | ORAL | Status: DC
  Administered 2022-08-09 – 2022-08-15 (×7): 1 via ORAL
  Filled 2022-08-09 (×7): qty 1

## 2022-08-09 NOTE — Assessment & Plan Note (Signed)
Left BKA

## 2022-08-09 NOTE — Assessment & Plan Note (Addendum)
Continue supportive medical therapy.

## 2022-08-09 NOTE — ED Notes (Signed)
Pt resting peacefully in bed.  

## 2022-08-09 NOTE — ED Notes (Signed)
Report given to [facility] and message sent to receiving RN informing him of pending transport.

## 2022-08-09 NOTE — ED Notes (Signed)
Pt was given a sandwich.  

## 2022-08-09 NOTE — Progress Notes (Signed)
Bladder scan was done: 582 @ 1830 (630pm)

## 2022-08-09 NOTE — Consult Note (Signed)
Reason for Consult: Acute kidney injury on chronic kidney disease stage IV Referring Physician: Erin Hearing MD [facility])  HPI:  62 year old man with past medical history significant for hypertension, type 2 diabetes mellitus, peripheral vascular disease status post left below-knee amputation, history of TIA, bipolar disorder, diastolic heart failure and chronic kidney disease stage IV at baseline (creatinine 3.8-4.0).  Brought to the emergency room from his skilled nursing facility for evaluation and management of low hemoglobin.  He was recently discharged from the hospital on 5/25 after a 3-day hospitalization for acute exacerbation of congestive heart failure when he was also found to have pancytopenia with positive Hemoccult test and was transfused 3 units PRBC.  Nephrology is consulted for assistance with management of acute worsening of renal function.  Of note, during a previous admission there was concerns of his worsening renal function when dialysis was recommended but the patient made the decision not to pursue any forms of renal replacement therapy-he states that he has thought more about it and would do it "only if necessary".  He denies any worsening of shortness of breath and continues to have swelling that is most prominent in his right leg.  Denies any fevers or chills and did not have any preceding nausea, vomiting, diarrhea or abdominal pain.  Past Medical History:  Diagnosis Date   Acute on chronic diastolic CHF (congestive heart failure) (HCC) 08/22/2020   Acute on chronic heart failure with preserved ejection fraction (HCC)    Acute renal failure superimposed on stage 3a chronic kidney disease (HCC) 11/18/2020   Arthritis    Bacteremia due to Enterococcus 12/09/2021   Bipolar affective (HCC)    CHF (congestive heart failure) (HCC)    Claustrophobia    Severe   Cocaine abuse (HCC) 05/31/2014   Cocaine dependence, uncomplicated (HCC) 05/31/2014   Depression    Diabetes  mellitus    uncontrolled    Elevated CK    Heart murmur    Hypertension    Ileus (HCC) 11/18/2020   Metabolic acidosis 10/23/2021   Non-traumatic rhabdomyolysis 01/01/2020   Pericardial effusion 08/22/2020   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   Suicidal ideations 08/28/2013    Past Surgical History:  Procedure Laterality Date   ABDOMINAL AORTOGRAM W/LOWER EXTREMITY Left 03/02/2021   Procedure: ABDOMINAL AORTOGRAM W/LOWER EXTREMITY;  Surgeon: Leonie Douglas, MD;  Location: [facility] INVASIVE CV LAB;  Service: Cardiovascular;  Laterality: Left;   AMPUTATION Left 03/07/2021   Procedure: LEFT BELOW KNEE AMPUTATION;  Surgeon: Leonie Douglas, MD;  Location: [facility] OR;  Service: Vascular;  Laterality: Left;   APPLICATION OF WOUND VAC Left 03/07/2021   Procedure: APPLICATION OF WOUND VAC;  Surgeon: Leonie Douglas, MD;  Location: [facility] OR;  Service: Vascular;  Laterality: Left;   BOWEL DECOMPRESSION N/A 01/19/2020   Procedure: BOWEL DECOMPRESSION;  Surgeon: Kathi Der, MD;  Location: [facility] ENDOSCOPY;  Service: Gastroenterology;  Laterality: N/A;   BOWEL DECOMPRESSION N/A 11/19/2020   Procedure: BOWEL DECOMPRESSION;  Surgeon: Charlott Rakes, MD;  Location: [facility] ENDOSCOPY;  Service: Endoscopy;  Laterality: N/A;   BOWEL DECOMPRESSION N/A 12/03/2020   Procedure: BOWEL DECOMPRESSION;  Surgeon: Kerin Salen, MD;  Location: [facility] ENDOSCOPY;  Service: Gastroenterology;  Laterality: N/A;   COLONOSCOPY WITH PROPOFOL N/A 01/19/2020   Procedure: COLONOSCOPY WITH PROPOFOL;  Surgeon: Kathi Der, MD;  Location: [facility] ENDOSCOPY;  Service: Gastroenterology;  Laterality: N/A;   COLONOSCOPY WITH PROPOFOL N/A 11/19/2020   Procedure: COLONOSCOPY WITH PROPOFOL;  Surgeon: Charlott Rakes,  MD;  Location: [facility] ENDOSCOPY;  Service: Endoscopy;  Laterality: N/A;   COLONOSCOPY WITH PROPOFOL N/A 12/03/2020   Procedure: COLONOSCOPY WITH PROPOFOL;  Surgeon: Kerin Salen, MD;  Location: [facility]  ENDOSCOPY;  Service: Gastroenterology;  Laterality: N/A;   KNEE SURGERY     b/l knees; pending total knee    RADIOLOGY WITH ANESTHESIA N/A 07/04/2020   Procedure: MRI WITH ANESTHESIA BRAIN WITHOUT CONTRAST AND HEAD WITHOUT CONTRAST;  Surgeon: Radiologist, Medication, MD;  Location: [facility] OR;  Service: Radiology;  Laterality: N/A;   TRANSMETATARSAL AMPUTATION Left 03/01/2021   Procedure: TRANSMETATARSAL AMPUTATION;  Surgeon: Vivi Barrack, DPM;  Location: [facility] OR;  Service: Podiatry;  Laterality: Left;    Family History  Problem Relation Age of Onset   Diabetes Other    Cancer Mother        uterus; deceased    Heart attack Mother 48   Hypertension Other    Heart attack Father 66   Stroke Sister        12/2012 age 40 y.o    Social History:  reports that he has quit smoking. He has never used smokeless tobacco. He reports that he does not currently use drugs after having used the following drugs: Cocaine. He reports that he does not drink alcohol.  Allergies:  Allergies  Allergen Reactions   Other Other (See Comments)    Grits- reaction not listed on MAR   Sertraline Other (See Comments)    Reaction not known    Medications: I have reviewed the patient's current medications. Scheduled:  amLODipine  10 mg Oral Daily   aspirin  81 mg Oral Daily   atorvastatin  80 mg Oral QHS   carvedilol  12.5 mg Oral BID WC   feeding supplement  237 mL Oral BID BM   furosemide  60 mg Intravenous Q12H   hydrALAZINE  50 mg Oral Q8H   insulin aspart  0-5 Units Subcutaneous QHS   insulin aspart  0-6 Units Subcutaneous TID WC   multivitamin with minerals  1 tablet Oral Daily   pantoprazole  40 mg Oral BID   sertraline  50 mg Oral QODAY   sodium bicarbonate  1,950 mg Oral BID   sodium chloride flush  3 mL Intravenous Q12H      Latest Ref Rng & Units 08/09/2022    5:32 AM 08/08/2022    8:28 PM 07/20/2022    7:47 AM  BMP  Glucose 70 - 99 mg/dL 161  096  045   BUN 8 - 23 mg/dL 74  80  61    Creatinine 0.61 - 1.24 mg/dL 4.09  8.11  9.14   Sodium 135 - 145 mmol/L 135  133  138   Potassium 3.5 - 5.1 mmol/L 3.8  3.8  4.3   Chloride 98 - 111 mmol/L 103  102  109   CO2 22 - 32 mmol/L 19  20  18    Calcium 8.9 - 10.3 mg/dL 8.0  7.8  8.0       Latest Ref Rng & Units 08/09/2022    5:32 AM 08/08/2022    8:28 PM 07/20/2022    7:47 AM  CBC  WBC 4.0 - 10.5 K/uL 1.9  1.9  1.8   Hemoglobin 13.0 - 17.0 g/dL 7.6  6.9  8.5   Hematocrit 39.0 - 52.0 % 23.7  22.1  27.0   Platelets 150 - 400 K/uL 52  57  83    Urinalysis    Component  Value Date/Time   COLORURINE YELLOW 08/09/2022 0530   APPEARANCEUR CLEAR 08/09/2022 0530   APPEARANCEUR Clear 12/31/2013 2116   LABSPEC 1.025 08/09/2022 0530   LABSPEC 1.032 12/31/2013 2116   PHURINE 5.5 08/09/2022 0530   GLUCOSEU NEGATIVE 08/09/2022 0530   GLUCOSEU >=500 12/31/2013 2116   HGBUR NEGATIVE 08/09/2022 0530   BILIRUBINUR NEGATIVE 08/09/2022 0530   BILIRUBINUR Negative 12/31/2013 2116   KETONESUR NEGATIVE 08/09/2022 0530   PROTEINUR 100 (A) 08/09/2022 0530   UROBILINOGEN 1.0 08/28/2013 1709   NITRITE NEGATIVE 08/09/2022 0530   LEUKOCYTESUR NEGATIVE 08/09/2022 0530   LEUKOCYTESUR Negative 12/31/2013 2116    DG Chest 2 View  Result Date: 08/08/2022 CLINICAL DATA:  [id number] with dyspnea. EXAM: CHEST - 2 VIEW COMPARISON:  Portable chest 07/17/2022, CT chest no contrast 03/17/2022 FINDINGS: The heart is moderately enlarged. Perihilar vascular congestion and edema are again noted. There is patchy airspace disease in the lung fields which could be airspace edema, pneumonia or combination. There are small layering pleural effusions. Mediastinum is normally outlined. There are degenerative changes and mild dextroscoliosis of the thoracic spine. Similar findings were noted on both prior studies. IMPRESSION: 1. Cardiomegaly with perihilar vascular congestion and edema. 2. Patchy airspace disease in the lung fields which could be airspace edema, pneumonia  or combination. 3. Small layering pleural effusions. 4. Clinical correlation and radiographic follow-up recommended. Electronically Signed   By: Almira Bar M.D.   On: 08/08/2022 21:18    Review of Systems  Constitutional:  Positive for fatigue. Negative for activity change, chills and fever.  HENT:  Negative for congestion, nosebleeds and trouble swallowing.   Eyes:  Negative for photophobia and visual disturbance.  Respiratory:  Negative for chest tightness and shortness of breath.   Cardiovascular:  Positive for leg swelling. Negative for chest pain.  Gastrointestinal:  Negative for abdominal pain, blood in stool, diarrhea, nausea and vomiting.  Endocrine: Negative for polydipsia and polyuria.  Genitourinary:  Negative for difficulty urinating, dysuria and hematuria.  Musculoskeletal:  Positive for myalgias.       Left BKA site  Skin:  Positive for wound.       Left BKA  Neurological:  Positive for weakness. Negative for light-headedness and headaches.   Blood pressure 137/69, pulse 67, temperature 98.7 F (37.1 C), temperature source Oral, resp. rate 10, height 5\' 8"  (1.727 m), weight 124.6 kg, SpO2 100 %. Physical Exam Vitals and nursing note reviewed.  Constitutional:      Appearance: He is obese. He is ill-appearing.  HENT:     Head: Normocephalic and atraumatic.     Right Ear: External ear normal.     Left Ear: External ear normal.     Mouth/Throat:     Mouth: Mucous membranes are moist.     Pharynx: Oropharynx is clear.  Eyes:     General: No scleral icterus.    Extraocular Movements: Extraocular movements intact.     Conjunctiva/sclera: Conjunctivae normal.  Cardiovascular:     Rate and Rhythm: Normal rate and regular rhythm.     Pulses: Normal pulses.     Heart sounds: Normal heart sounds.  Pulmonary:     Effort: Pulmonary effort is normal.     Breath sounds: Normal breath sounds.  Abdominal:     General: Bowel sounds are normal. There is no distension.      Palpations: Abdomen is soft.     Tenderness: There is no abdominal tenderness.  Musculoskeletal:     Cervical back: Normal  range of motion and neck supple.     Right lower leg: Edema present.     Comments: 3+ pitting edema right lower extremity.  Left leg status post BKA with dressing over posterior aspect of stump  Neurological:     Mental Status: He is alert and oriented to person, place, and time.     Assessment/Plan: 1.  Acute kidney injury on chronic kidney disease stage IV: Appears to be hemodynamically mediated in the setting of anemia and renal hypoperfusion in the setting of ongoing diuretic use (decreased effective arterial blood volume with peripheral edema due to third spacing).  Agree with PRBC transfusion and efforts at limiting hypotension with reevaluation of diuretic dosing.  He does not have any acute indications for dialysis at this time and would be a poor candidate for long-term dialysis based on his habitus, mobility challenges and underlying quality of life. Avoid nephrotoxic medications including NSAIDs and iodinated intravenous contrast exposure unless the latter is absolutely indicated.  Preferred narcotic agents for pain control are hydromorphone, fentanyl, and methadone. Morphine should not be used. Avoid Baclofen and avoid oral sodium phosphate and magnesium citrate based laxatives / bowel preps. Continue strict Input and Output monitoring. Will monitor the patient closely with you and intervene or adjust therapy as indicated by changes in clinical status/labs. 2.  Pancytopenia: Recently recognized and status post PRBC transfusion.  Unclear if this is from his prior history of polysubstance abuse. 3.  Acute exacerbation of chronic diastolic heart failure: With evidence of acute hypoxic respiratory failure.  Likely exacerbated by anemia.  Monitor with PRBC transfusion and ongoing diuresis. 4.  Hypertension: Blood pressure currently acceptable on amlodipine, carvedilol and  hydralazine.  May need to reevaluate vasodilator antihypertensive choices with persistent edema.   [name] 08/09/2022, 10:30 AM

## 2022-08-09 NOTE — Assessment & Plan Note (Addendum)
Hypokalemia. Hyponatremia.   TDC placed on 06/21.  Patient received intermittent renal replacement therapy with ultrafiltration.   At the time of his discharge his fluid balance is negative 19,993 ml with improvement in his edema.   Patient with AML, with very poor prognosis, multiple comorbid conditions and poor physical functional reserve.  Nephrology recommended continue care under hospice services.  No further dialysis, his catheter will be removed prior to his discharge.

## 2022-08-09 NOTE — Assessment & Plan Note (Addendum)
Bone morrow suggesting evolving AML.  Given his baseline poor performance (ECOG PS 4) with significant multiple co-morbidities including ESRD, CHF, decubitus ulcers, recurrent infections and others, he is not a candidate for treatment.  Symptomatic anemia.  Recent GI evaluation, recommended conservative management.   Patient was treated with PRBC transfusion, he required a total of 4 during his hospitalization, he also received EPO.   His discharge Hgb is 8,6, wbc 1,5 and plt 145. Consistent with pancytopenia.

## 2022-08-09 NOTE — Assessment & Plan Note (Addendum)
His glucose has been stable, now with worsening renal function and low GFR. Will continue to hold on insulin therapy.   Continue with statin therapy.

## 2022-08-09 NOTE — Assessment & Plan Note (Signed)
Continue blood pressure control with carvedilol.  

## 2022-08-09 NOTE — Progress Notes (Signed)
Heart Failure Navigator Progress Note  Assessed for Heart & Vascular TOC clinic readiness.  Patient does not meet criteria due to EF 50-55 % , CKD V, No HF TOC per Dr. Ella Jubilee.   Navigator will sign off at this time.   Rhae Hammock, BSN, Scientist, clinical (histocompatibility and immunogenetics) Only

## 2022-08-09 NOTE — Progress Notes (Signed)
Progress Note   Patient: [name] ZOX:[id number] DOB: 1961/01/03 DOA: 08/08/2022     1 DOS: the patient was seen and examined on 08/09/2022   Brief hospital course: [name] was admitted to the hospital with the working diagnosis of symptomatic anemia.   62 yo male with the past medical history of hypertension, T2DM, CKD stage IV, peripheral vascular disease, history of CVA and chronic heart failure, who presented from SNF for evaluation of low hgb. Recent hospitalization 05/22 to 05/25 for heart failure exacerbation, and anemia with positive stool occult blood test. Required 3 units PRBC transfusion. No invasive procedure required. Follow up hgb was low and patient was referred to the ED for further evaluation. On his initial physical examination his blood pressure was 144/87, HR 69, RR 18 and 02 saturation 97%, lungs with bilateral rales with no wheezing, heart with S1 and S2 present and rhythmic, no abdominal distention and positive left stump pitting edema.   Na 133 K 3,8 CL 102 bicarbonate 20, glucose 131 BUN 80 cr 5,26  BNP 498  High sensitive troponin 31  Iron 72, transferrin saturation 32, ferritin 628.  Wbc 1,9, hgb 6,9 plt 57   Transfused one unit PRBC with follow up hgb at 7,6   Assessment and Plan: * Pancytopenia (HCC) Symptomatic anemia.  Sp one unit PRBC transfusion, follow up hgb is 7.6  Recent GI evaluation, recommended conservative management.  Follow up hgb in am.   Possible bone marrow failure considering leukopenia and thrombocytopenia. Poor prognosis.   Acute renal failure superimposed on stage 4 chronic kidney disease (HCC) Oliguric renal failure, possible multifactorial.   Follow up renal function with serum cr at 5,0 with K at 3,8 and serum bicarbonate at 19.  Na 135 and Mg 1,8   Urine output is documented at 225 ml.   Plan to follow up renal function in am, avoid hypotension or nephrotoxic medications.  Diuresis with furosemide and albumin.   Non  anion gap metabolic acidosis, continue with oral sodium bicarbonate.   Acute on chronic heart failure with preserved ejection fraction (HFpEF) (HCC) Echocardiogram with preserved LV systolic function. EF 50 to 55%. Moderate LVH, RV systolic function preserved. Mild aortic valve stenosis.   Positive edema at the left stump, pitting.  Plan to continue close blood pressure monitoring, diuresis with furosemide and albumin.  On amlodipine, carvedilol and hydralazine.   Essential hypertension Continue blood pressure control with carvedilol.   History of CVA (cerebrovascular accident) Continue supportive medical therapy. PT and OT. Patient will return to SNF when medically stable.   T2DM (type 2 diabetes mellitus) (HCC) Continue insulin sliding scale for glucose cover and monitoring. Hold on basal insulin for now.  Fasting glucose this am 123.   PAD (peripheral artery disease) (HCC) Left BKA.         Subjective: Patient continue very weak and deconditioned, positive dyspnea, but not chest pain   Physical Exam: Vitals:   08/09/22 0440 08/09/22 0721 08/09/22 1103 08/09/22 1349  BP: (!) 151/86 137/69 (!) 146/83 (!) 153/85  Pulse: 67 67 68 65  Resp: 19 10 13 10   Temp: 98.4 F (36.9 C) 98.7 F (37.1 C) 99.5 F (37.5 C)   TempSrc: Oral Oral Oral   SpO2: 98% 100% 100% 100%  Weight:      Height:       Neurology awake and alert ENT with mild pallor Cardiovascular with S1 and S2 present and rhythmic, positive systolic murmur at the apex No  JVD Positive pitting edema at the stump left lower extremity Respiratory with rales bilaterally with no wheezing Abdomen with no distention  Data Reviewed:    Family Communication: no family at the bedside   Disposition: Status is: Inpatient Remains inpatient appropriate because: heart failure and renal failure   Planned Discharge Destination: Skilled nursing facility    Author: Coralie Keens, MD 08/09/2022 3:18 PM  For  on call review [email or web address].

## 2022-08-09 NOTE — Hospital Course (Addendum)
[name] was admitted to the hospital with the working diagnosis of symptomatic anemia.   62 yo male with the past medical history of hypertension, T2DM, CKD stage IV, peripheral vascular disease, history of CVA and chronic heart failure, who presented from SNF for evaluation of low hgb. Recent hospitalization 05/22 to 05/25 for heart failure exacerbation, and anemia with positive stool occult blood test. Required 3 units PRBC transfusion. No invasive procedure required. Follow up hgb was low and patient was referred to the ED for further evaluation. On his initial physical examination his blood pressure was 144/87, HR 69, RR 18 and 02 saturation 97%, lungs with bilateral rales with no wheezing, heart with S1 and S2 present and rhythmic, no abdominal distention and positive left stump pitting edema.   Na 133 K 3,8 CL 102 bicarbonate 20, glucose 131 BUN 80 cr 5,26  BNP 498  High sensitive troponin 31  Iron 72, transferrin saturation 32, ferritin 628.  Wbc 1,9, hgb 6,9 plt 57   Transfused one unit PRBC with follow up hgb at 7,6  Positive urinary retention, required in and out catheterization and posteriorly foley catheter.   06/15 continue to have hypervolemia. Further drop in Hgb down to 6,8 and had another PRBC transfusion.    Worsening renal function. He had TDC placed and HD started on 6/21.  Nephrology and palliative medicine following.  Oncology consulted and ordered bone marrow biopsy.  Unfortunately, bone marrow biopsy aborted on 6/25 due to orthopnea, desaturation, nausea and emesis.    Tolerated HD in recliner 6/26. Still needs a lot of assistance to get out of bed (hoyer lift, 2+ assist).   06/29 patient with improvement in nausea and back pain. Continue very weak and deconditioned. Poor oral intake.  06/30 mentation and gastrointestinal symptoms improving. 07/01 tolerating well HD.  07/02 vascular surgery recommended fistula creation as outpatient, once comorbid conditions are more  stable.  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 Unfortunately not candidate for treatment, considering his comorbid conditions and physical functional status.  Patient has decided to continue care under hospice services.

## 2022-08-09 NOTE — Assessment & Plan Note (Addendum)
Echocardiogram with preserved LV systolic function. EF 50 to 55%. Moderate LVH, RV systolic function preserved. Mild aortic valve stenosis.   Blood pressure control with amlodipine, carvedilol and hydralazine.

## 2022-08-09 NOTE — Progress Notes (Signed)
Initial Nutrition Assessment  DOCUMENTATION CODES:   Obesity unspecified, Non-severe (moderate) malnutrition in context of chronic illness  INTERVENTION:  Continue Regular diet as ordered to optimize PO intake Ensure Enlive po BID, each supplement provides 350 kcal and 20 grams of protein. MVI with minerals daily  NUTRITION DIAGNOSIS:   Moderate Malnutrition related to chronic illness (HF, PAD) as evidenced by mild fat depletion, mild muscle depletion, edema.  GOAL:   Patient will meet greater than or equal to 90% of their needs  MONITOR:   PO intake, Supplement acceptance, Labs, Weight trends, I & O's  REASON FOR ASSESSMENT:   Malnutrition Screening Tool    ASSESSMENT:   Pt admitted from SNF d/t low hemoglobin. PMH significant for HTN, insulin-dependent DM, CVA, PAD, chronic HFpEF. Recently admitted for acute on chronic HF, acute hypoxic respiratory failure and new pancytopenia with positive FOBT.  Spoke with pt at bedside. He endorses having a headache today, made RN aware. He states that he has been living at [facility] for about 1 year. He recalls having a poor appetite and PO intake. Eats about 1 meal per day.   He is familiar with ensure, does not particularly enjoy them but is agreeable to help enhance nutritional adequacy.  Denies difficulty chewing and is able to self feed.   Meal completions: 6/14: 75% breakfast  Reviewed weight history. Pt's weight has increased from 121.5 kg on 02/06 up to 124.6 kg today. Dry weight unknown. Unable to assess for true weight changes d/t presence of edema.   Edema: mild pitting generalized, BUE, moderate pitting BLE  Medications: lasix 60mg  q12h, SSI 0-5 units qhs, SSI 0-6 units TID, protonix, sodium bicarbonate  Labs: BUN 74, Cr 5.09, phos 5.7, GFR 12, CBG's 115, 123 x24 hours, hgb 7.6  NUTRITION - FOCUSED PHYSICAL EXAM: Pt likely has more significant muscle/fat depletions that are masked by edema. Flowsheet Row Most  Recent Value  Orbital Region Mild depletion  Upper Arm Region Moderate depletion  [L>R]  Thoracic and Lumbar Region No depletion  Buccal Region Mild depletion  Temple Region Mild depletion  Clavicle Bone Region No depletion  Clavicle and Acromion Bone Region No depletion  Scapular Bone Region Mild depletion  Dorsal Hand Severe depletion  [L>R]  Patellar Region No depletion  Anterior Thigh Region No depletion [L BKA]  Posterior Calf Region No depletion  Edema (RD Assessment) Moderate  [BLE, RUE]  Hair Reviewed  Eyes Reviewed  Mouth Other (Comment)  [poor dentition]  Skin Reviewed  Nails Reviewed       Diet Order:   Diet Order             Diet regular Room service appropriate? Yes; Fluid consistency: Thin  Diet effective now                   EDUCATION NEEDS:   Education needs have been addressed  Skin:  Skin Assessment: Reviewed RN Assessment  Last BM:  PTA  Height:   Ht Readings from Last 1 Encounters:  08/09/22 5\' 8"  (1.727 m)    Weight:   Wt Readings from Last 1 Encounters:  08/09/22 124.6 kg    Ideal Body Weight:  70 kg  BMI:  Body mass index is 41.77 kg/m.  Estimated Nutritional Needs:   Kcal:  1800-2000  Protein:  95-110g  Fluid:  >/=1.8L  Drusilla Kanner, RDN, LDN Clinical Nutrition

## 2022-08-09 NOTE — ED Notes (Signed)
RN assumed care of pt, found him sleeping soundly in bed.  RN observed a slow unlabored rise and fall of chest.  No distress noted at this time.

## 2022-08-10 DIAGNOSIS — D61818 Other pancytopenia: Secondary | ICD-10-CM | POA: Diagnosis not present

## 2022-08-10 DIAGNOSIS — I10 Essential (primary) hypertension: Secondary | ICD-10-CM | POA: Diagnosis not present

## 2022-08-10 DIAGNOSIS — Z8673 Personal history of transient ischemic attack (TIA), and cerebral infarction without residual deficits: Secondary | ICD-10-CM | POA: Diagnosis not present

## 2022-08-10 DIAGNOSIS — N179 Acute kidney failure, unspecified: Secondary | ICD-10-CM | POA: Diagnosis not present

## 2022-08-10 LAB — BASIC METABOLIC PANEL
Anion gap: 12 (ref 5–15)
BUN: 78 mg/dL — ABNORMAL HIGH (ref 8–23)
CO2: 20 mmol/L — ABNORMAL LOW (ref 22–32)
Calcium: 7.6 mg/dL — ABNORMAL LOW (ref 8.9–10.3)
Chloride: 103 mmol/L (ref 98–111)
Creatinine, Ser: 5.3 mg/dL — ABNORMAL HIGH (ref 0.61–1.24)
GFR, Estimated: 12 mL/min — ABNORMAL LOW (ref >60)
Glucose, Bld: 146 mg/dL — ABNORMAL HIGH (ref 70–99)
Potassium: 4.1 mmol/L (ref 3.5–5.1)
Sodium: 135 mmol/L (ref 135–145)

## 2022-08-10 LAB — CBC
HCT: 21.3 % — ABNORMAL LOW (ref 39.0–52.0)
Hemoglobin: 6.8 g/dL — CL (ref 13.0–17.0)
MCH: 28.2 pg (ref 26.0–34.0)
MCHC: 31.9 g/dL (ref 30.0–36.0)
MCV: 88.4 fL (ref 80.0–100.0)
Platelets: 45 10*3/uL — ABNORMAL LOW (ref 150–400)
RBC: 2.41 MIL/uL — ABNORMAL LOW (ref 4.22–5.81)
RDW: 18.5 % — ABNORMAL HIGH (ref 11.5–15.5)
WBC: 1.3 10*3/uL — CL (ref 4.0–10.5)
nRBC: 0 % (ref 0.0–0.2)

## 2022-08-10 LAB — TYPE AND SCREEN

## 2022-08-10 LAB — GLUCOSE, CAPILLARY
Glucose-Capillary: 113 mg/dL — ABNORMAL HIGH (ref 70–99)
Glucose-Capillary: 148 mg/dL — ABNORMAL HIGH (ref 70–99)
Glucose-Capillary: 149 mg/dL — ABNORMAL HIGH (ref 70–99)
Glucose-Capillary: 220 mg/dL — ABNORMAL HIGH (ref 70–99)

## 2022-08-10 LAB — BPAM RBC: Unit Type and Rh: 5100

## 2022-08-10 LAB — UREA NITROGEN, URINE: Urea Nitrogen, Ur: 367 mg/dL

## 2022-08-10 LAB — PREPARE RBC (CROSSMATCH)

## 2022-08-10 IMAGING — DX DG ABDOMEN 1V
2 series · 2 of 2 positions shown · non-contrast
Comparison: 11/29/2020

CLINICAL DATA: Abdominal pain

EXAM:
ABDOMEN - 1 VIEW

[abdomen kub (1 of 2)]
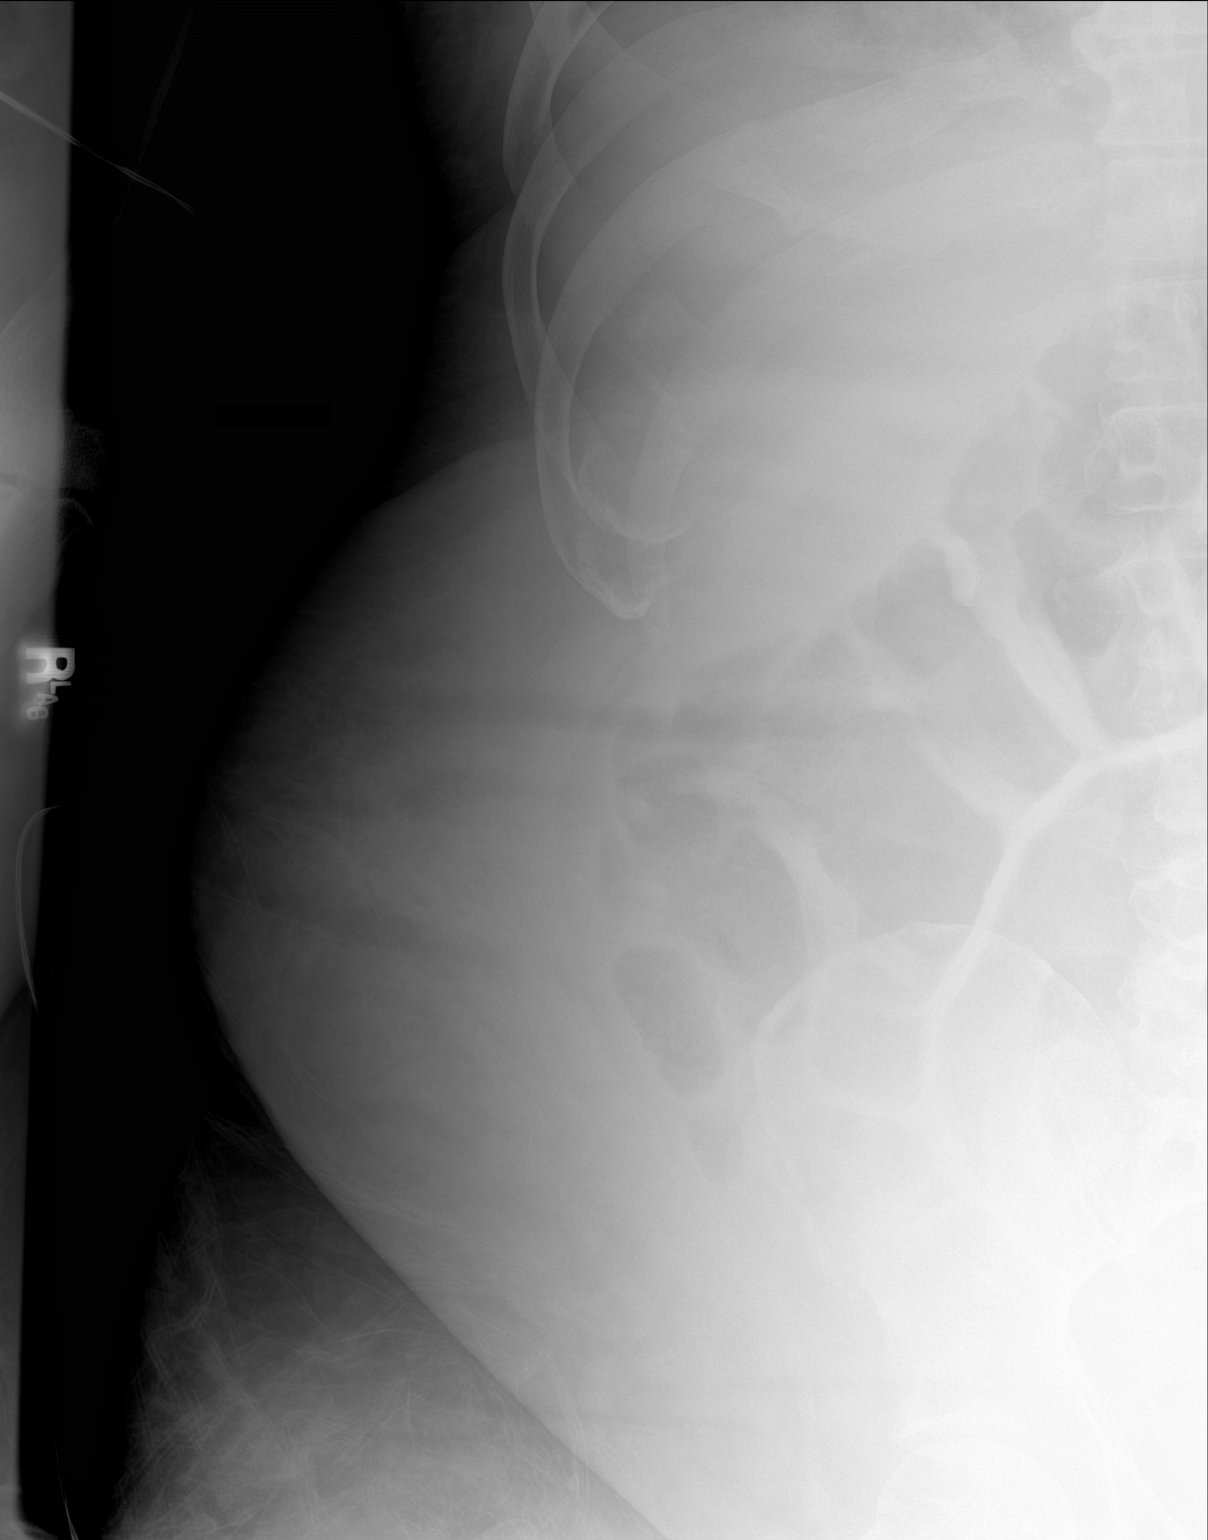

[abdomen kub (2 of 2)]
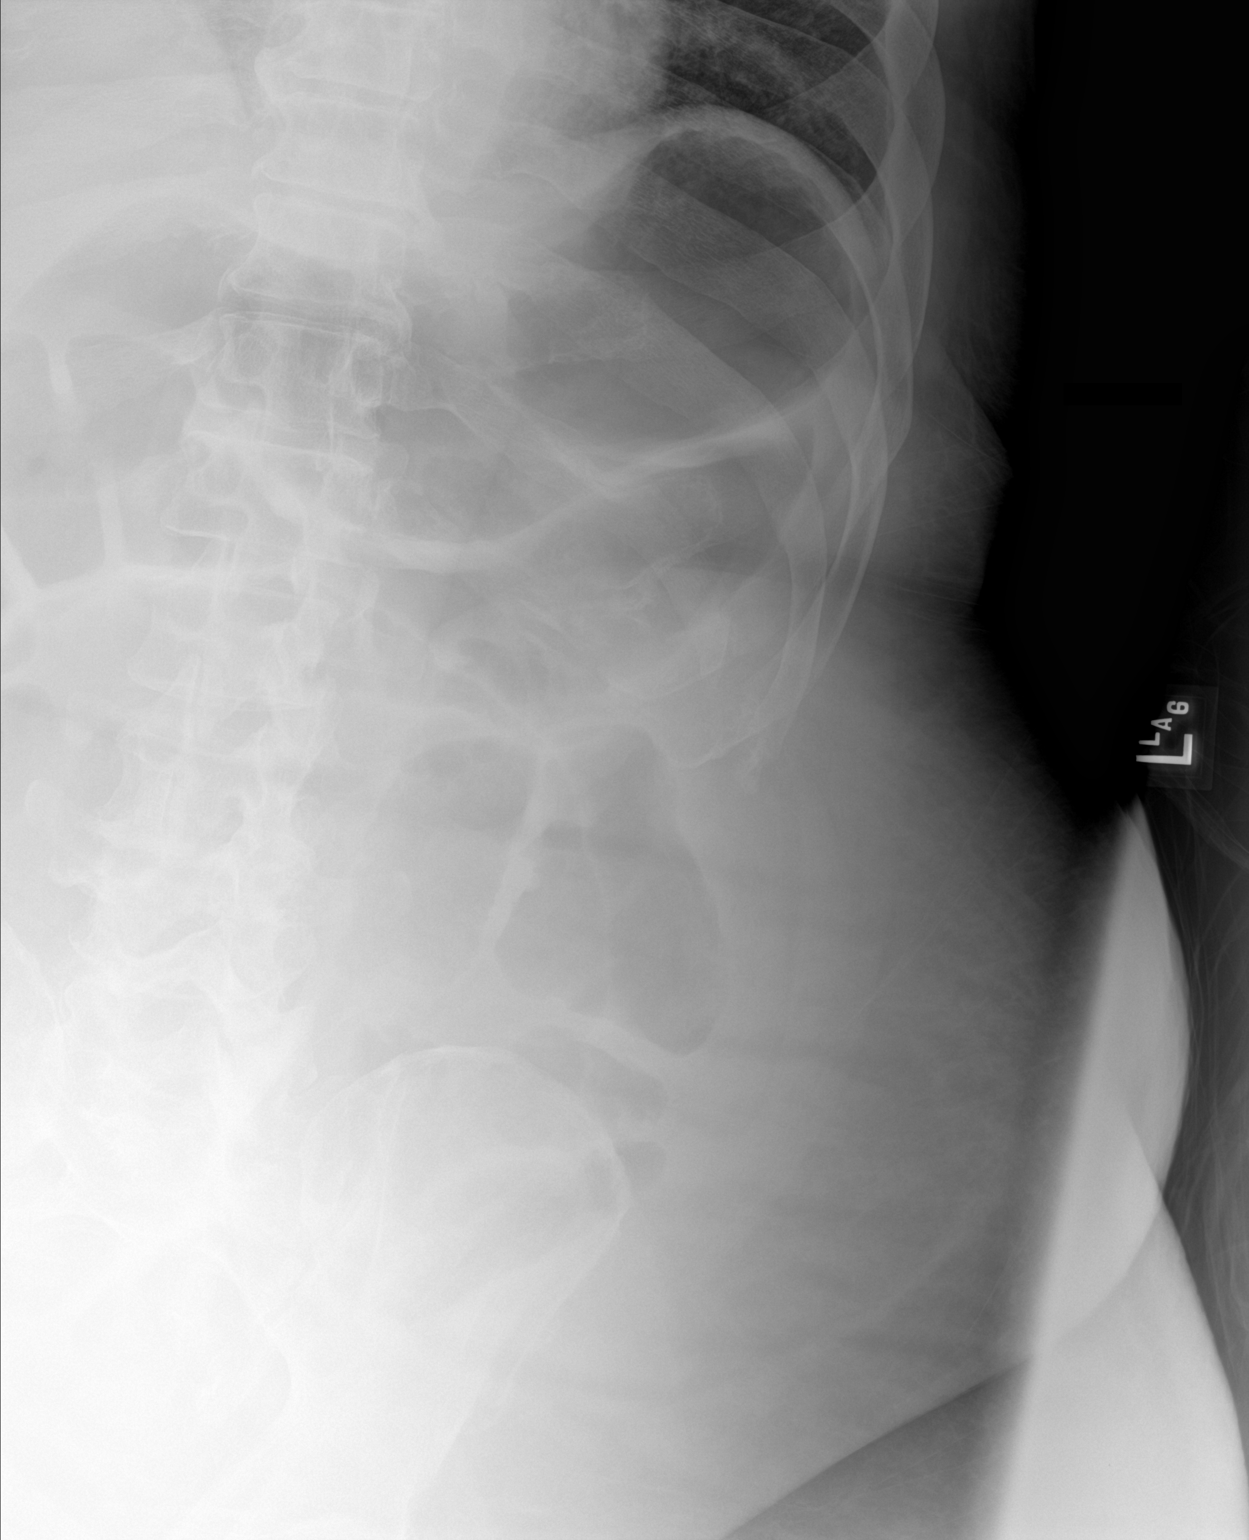

[2 of 2 positions shown; findings below may reference images not displayed]

FINDINGS: Persistent diffuse gaseous distention of the colon, similar prior
exam. There are likely some distended loops of small bowel as well.
IMPRESSION: Persistent diffuse gaseous distention of the colon, similar to prior
exam.

## 2022-08-10 MED ORDER — ALBUMIN HUMAN 25 % IV SOLN
12.5000 g | Freq: Once | INTRAVENOUS | Status: AC
  Administered 2022-08-10: 12.5 g via INTRAVENOUS
  Filled 2022-08-10: qty 50

## 2022-08-10 MED ORDER — LIDOCAINE HCL URETHRAL/MUCOSAL 2 % EX GEL
1.0000 | Freq: Once | CUTANEOUS | Status: DC
  Filled 2022-08-10: qty 6

## 2022-08-10 MED ORDER — ALBUMIN HUMAN 25 % IV SOLN: 12.5000 g | Freq: Once | INTRAVENOUS | Status: DC

## 2022-08-10 MED ORDER — SODIUM CHLORIDE 0.9% IV SOLUTION: Freq: Once | INTRAVENOUS | Status: AC

## 2022-08-10 MED ORDER — FUROSEMIDE 10 MG/ML IJ SOLN
120.0000 mg | Freq: Once | INTRAVENOUS | Status: AC
  Administered 2022-08-10: 120 mg via INTRAVENOUS
  Filled 2022-08-10: qty 10

## 2022-08-10 MED ORDER — FUROSEMIDE 10 MG/ML IJ SOLN: 80.0000 mg | Freq: Once | INTRAMUSCULAR | Status: DC

## 2022-08-10 NOTE — Progress Notes (Signed)
Progress Note   Patient: [name]:[id number] DOB: [birth year] DOA: 08/08/2022     2 DOS: the patient was seen and examined on 08/10/2022   Brief hospital course: [name] was admitted to the hospital with the working diagnosis of symptomatic anemia.   62 yo male with the past medical history of hypertension, T2DM, CKD stage IV, peripheral vascular disease, history of CVA and chronic heart failure, who presented from SNF for evaluation of low hgb. Recent hospitalization 05/22 to 05/25 for heart failure exacerbation, and anemia with positive stool occult blood test. Required 3 units PRBC transfusion. No invasive procedure required. Follow up hgb was low and patient was referred to the ED for further evaluation. On his initial physical examination his blood pressure was 144/87, HR 69, RR 18 and 02 saturation 97%, lungs with bilateral rales with no wheezing, heart with S1 and S2 present and rhythmic, no abdominal distention and positive left stump pitting edema.   Na 133 K 3,8 CL 102 bicarbonate 20, glucose 131 BUN 80 cr 5,26  BNP 498  High sensitive troponin 31  Iron 72, transferrin saturation 32, ferritin 628.  Wbc 1,9, hgb 6,9 plt 57   Transfused one unit PRBC with follow up hgb at 7,6  Positive urinary retention, required in and out catheterization.   06/15 continue to have hypervolemia. Further drop in Hgb down to 6,8 and had PRBC transfusion.    Assessment and Plan: * Pancytopenia (HCC) Symptomatic anemia.  Recent GI evaluation, recommended conservative management.  Follow up hgb down to 6,8 and required another PRBC transfusion for a total of 2 since admission.   Possible bone marrow failure considering leukopenia and thrombocytopenia. Poor prognosis.   Acute renal failure superimposed on stage 4 chronic kidney disease (HCC) Possible multifactorial.  Patient had urinary retention yesterday, 800 cc.   Today continue to have volume overload, his renal function has a serum  cr of 5,30 with K at 4,1 and serum bicarbonate at 20. Na 135   Non anion gap metabolic acidosis, continue with oral sodium bicarbonate.   Continue bladder scan, may need foley catheter if persistent urinary retention.   Acute on chronic heart failure with preserved ejection fraction (HFpEF) (HCC) Echocardiogram with preserved LV systolic function. EF 50 to 55%. Moderate LVH, RV systolic function preserved. Mild aortic valve stenosis.   Persistent edema bilateral lower extremities, +++  Diuresis with IV furosemide and IV albumin.  On amlodipine, carvedilol and hydralazine.   Essential hypertension Continue blood pressure control with carvedilol.   History of CVA (cerebrovascular accident) Continue supportive medical therapy. PT and OT. Patient will return to SNF when medically stable.   T2DM (type 2 diabetes mellitus) (HCC) Continue insulin sliding scale for glucose cover and monitoring. Fasting glucose this am 146 Continue to hold on basal insulin.   PAD (peripheral artery disease) (HCC) Left BKA.         Subjective: Patient continue to be very weak and deconditioned, positive dyspnea and edema   Physical Exam: Vitals:   08/10/22 0740 08/10/22 0745 08/10/22 0836 08/10/22 0840  BP:   (!) 148/75 (!) 148/75  Pulse: 64 64 69 66  Resp: 11 11 20 10   Temp:   98.5 F (36.9 C)   TempSrc:   Oral   SpO2: 99% 99%  99%  Weight:      Height:       Neurology awake and alert ENT with mild pallor Cardiovascular with S1 and S2 present and regular with  positive systolic murmur at the apex and base. Positive JVD Positive lower extremity edema +++ pitting right leg and left thigh.  Respiratory with no rhonchi or wheezing, rales at bases Abdomen with no distention  Data Reviewed:    Family Communication: no family at the bedside   Disposition: Status is: Inpatient Remains inpatient appropriate because: renal failure and volume overload, persistent anemia   Planned  Discharge Destination: Skilled nursing facility      Author: Coralie Keens, MD 08/10/2022 10:48 AM  For on call review [email or web address].

## 2022-08-10 NOTE — Progress Notes (Signed)
1 unit PRBCs ordered to be transfused for hemoglobin of 6.8K.  Will repeat CBC post blood transfusion.

## 2022-08-10 NOTE — Progress Notes (Addendum)
Bladder scan = .MD made aware, ordered  foley catheter insertion.   230pm Pt refused foley catheter insertion, explained to importance of placing indwelling catheter but pt still refused.Discussed complications of having high volume  oretained urine. Like hydronephrosis, UTI and kidney problems. Still refused, MD came to speak to patient but to no avail. Friend  at beside also trying to convince pt  but still pt refused.  Vet said yelling,  "holy s* can you not understand, I don't want it".

## 2022-08-10 NOTE — Progress Notes (Addendum)
Patient ID: CHIRSTOPHER [name], male   DOB: 18-Sep-1960, 62 y.o.   MRN: [id number] [facility] Progress Note   Assessment/ Plan:   1. Acute kidney Injury on chronic kidney disease stage IV: Appears to be hemodynamically mediated with decreased effective arterial blood volume in the setting of significant third spacing/diastolic heart failure.  This was likely exacerbated by his significant anemia.  Renal function slightly worse overnight but without any acute indications for dialysis.  Will redose diuretics over the next 24 hours to help augment urine output/manage volume status.  He understands that he is at high risk for needing dialysis; in the past appeared to favor not undertaking chronic dialysis but now willing to do this if/when required. 2.  Pancytopenia: Status post PRBC transfusion overnight.  No overt blood loss noted in spite of thrombocytopenia. 3.  Acute exacerbation of chronic diastolic heart failure: Possibly exacerbated by his anemia.  Diuretics transiently held in the setting of worsening renal function and will be continued today for volume unloading. 4.  Hypertension: Blood pressures marginally elevated on a combination of amlodipine, carvedilol and hydralazine.  Will reevaluate with ongoing diuresis.  Subjective:   Reports that he continues to feel fatigued and denies any worsening shortness of breath overnight.   Objective:   BP (!) 149/79   Pulse 64   Temp 98.7 F (37.1 C) (Oral)   Resp 11   Ht 5\' 8"  (1.727 m)   Wt 128 kg   SpO2 99%   BMI 42.91 kg/m   Intake/Output Summary (Last 24 hours) at 08/10/2022 4098 Last data filed at 08/09/2022 2120 Gross per 24 hour  Intake 650 ml  Output 800 ml  Net -150 ml   Weight change: 3.4 kg  Physical Exam: Gen: Appears comfortable sleeping in bed, easy to awaken and engage in conversation CVS: Pulse regular rhythm, normal rate, ejection systolic murmur audible over outflow tract Resp: Diminished breath sounds over  bases, no rales/rhonchi Abd: Soft, obese, nontender, bowel sounds normal Ext: Status post left below-knee amputation with dressing over posterior aspect of stump.  Right leg with 3+ edema and venous stasis changes  Imaging: DG Chest 2 View  Result Date: 08/08/2022 CLINICAL DATA:  119147 with dyspnea. EXAM: CHEST - 2 VIEW COMPARISON:  Portable chest 07/17/2022, CT chest no contrast 03/17/2022 FINDINGS: The heart is moderately enlarged. Perihilar vascular congestion and edema are again noted. There is patchy airspace disease in the lung fields which could be airspace edema, pneumonia or combination. There are small layering pleural effusions. Mediastinum is normally outlined. There are degenerative changes and mild dextroscoliosis of the thoracic spine. Similar findings were noted on both prior studies. IMPRESSION: 1. Cardiomegaly with perihilar vascular congestion and edema. 2. Patchy airspace disease in the lung fields which could be airspace edema, pneumonia or combination. 3. Small layering pleural effusions. 4. Clinical correlation and radiographic follow-up recommended. Electronically Signed   By: Almira Bar M.D.   On: 08/08/2022 21:18    Labs: BMET Recent Labs  Lab 08/08/22 2028 08/09/22 0532 08/10/22 0031  NA 133* 135 135  K 3.8 3.8 4.1  CL 102 103 103  CO2 20* 19* 20*  GLUCOSE 131* 123* 146*  BUN 80* 74* 78*  CREATININE 5.26* 5.09* 5.30*  CALCIUM 7.8* 8.0* 7.6*  PHOS  --  5.7*  --    CBC Recent Labs  Lab 08/08/22 2028 08/09/22 0532 08/10/22 0031  WBC 1.9* 1.9* 1.3*  NEUTROABS 1.0*  --   --  HGB 6.9* 7.6* 6.8*  HCT 22.1* 23.7* 21.3*  MCV 90.2 88.8 88.4  PLT 57* 52* 45*    Medications:     amLODipine  10 mg Oral Daily   aspirin  81 mg Oral Daily   atorvastatin  80 mg Oral QHS   carvedilol  12.5 mg Oral BID WC   feeding supplement  237 mL Oral BID BM   furosemide  80 mg Intravenous Once   hydrALAZINE  50 mg Oral Q8H   insulin aspart  0-5 Units Subcutaneous  QHS   insulin aspart  0-6 Units Subcutaneous TID WC   multivitamin with minerals  1 tablet Oral Daily   pantoprazole  40 mg Oral Daily   sertraline  50 mg Oral QODAY   sodium bicarbonate  1,950 mg Oral BID   sodium chloride flush  3 mL Intravenous Q12H   Zetta Bills, MD 08/10/2022, 8:38 AM

## 2022-08-11 DIAGNOSIS — I10 Essential (primary) hypertension: Secondary | ICD-10-CM | POA: Diagnosis not present

## 2022-08-11 DIAGNOSIS — N179 Acute kidney failure, unspecified: Secondary | ICD-10-CM | POA: Diagnosis not present

## 2022-08-11 DIAGNOSIS — Z8673 Personal history of transient ischemic attack (TIA), and cerebral infarction without residual deficits: Secondary | ICD-10-CM | POA: Diagnosis not present

## 2022-08-11 DIAGNOSIS — D61818 Other pancytopenia: Secondary | ICD-10-CM | POA: Diagnosis not present

## 2022-08-11 LAB — TYPE AND SCREEN
ABO/RH(D): O POS
Antibody Screen: NEGATIVE
Unit division: 0

## 2022-08-11 LAB — BASIC METABOLIC PANEL
Anion gap: 12 (ref 5–15)
BUN: 90 mg/dL — ABNORMAL HIGH (ref 8–23)
CO2: 21 mmol/L — ABNORMAL LOW (ref 22–32)
Calcium: 7.5 mg/dL — ABNORMAL LOW (ref 8.9–10.3)
Chloride: 102 mmol/L (ref 98–111)
Creatinine, Ser: 5.31 mg/dL — ABNORMAL HIGH (ref 0.61–1.24)
GFR, Estimated: 12 mL/min — ABNORMAL LOW (ref >60)
Glucose, Bld: 116 mg/dL — ABNORMAL HIGH (ref 70–99)
Potassium: 4.2 mmol/L (ref 3.5–5.1)
Sodium: 135 mmol/L (ref 135–145)

## 2022-08-11 LAB — CBC
HCT: 24.4 % — ABNORMAL LOW (ref 39.0–52.0)
Hemoglobin: 7.7 g/dL — ABNORMAL LOW (ref 13.0–17.0)
MCH: 27.4 pg (ref 26.0–34.0)
MCHC: 31.6 g/dL (ref 30.0–36.0)
MCV: 86.8 fL (ref 80.0–100.0)
Platelets: 45 10*3/uL — ABNORMAL LOW (ref 150–400)
RBC: 2.81 MIL/uL — ABNORMAL LOW (ref 4.22–5.81)
RDW: 17.9 % — ABNORMAL HIGH (ref 11.5–15.5)
WBC: 1.3 10*3/uL — CL (ref 4.0–10.5)
nRBC: 0 % (ref 0.0–0.2)

## 2022-08-11 LAB — GLUCOSE, CAPILLARY
Glucose-Capillary: 120 mg/dL — ABNORMAL HIGH (ref 70–99)
Glucose-Capillary: 281 mg/dL — ABNORMAL HIGH (ref 70–99)
Glucose-Capillary: 140 mg/dL — ABNORMAL HIGH (ref 70–99)
Glucose-Capillary: 352 mg/dL — ABNORMAL HIGH (ref 70–99)

## 2022-08-11 LAB — BPAM RBC
Blood Product Expiration Date: 202407092359
ISSUE DATE / TIME: 202406150523

## 2022-08-11 MED ORDER — FUROSEMIDE 10 MG/ML IJ SOLN
120.0000 mg | Freq: Three times a day (TID) | INTRAVENOUS | Status: DC
  Administered 2022-08-11 (×3): 120 mg via INTRAVENOUS
  Filled 2022-08-11: qty 2
  Filled 2022-08-11 (×4): qty 12
  Filled 2022-08-11: qty 10

## 2022-08-11 NOTE — Progress Notes (Addendum)
Progress Note   Patient: [name] UJW:[id number] DOB: 1961-01-08 DOA: 08/08/2022     3 DOS: the patient was seen and examined on 08/11/2022   Brief hospital course: [name] was admitted to the hospital with the working diagnosis of symptomatic anemia.   62 yo male with the past medical history of hypertension, T2DM, CKD stage IV, peripheral vascular disease, history of CVA and chronic heart failure, who presented from SNF for evaluation of low hgb. Recent hospitalization 05/22 to 05/25 for heart failure exacerbation, and anemia with positive stool occult blood test. Required 3 units PRBC transfusion. No invasive procedure required. Follow up hgb was low and patient was referred to the ED for further evaluation. On his initial physical examination his blood pressure was 144/87, HR 69, RR 18 and 02 saturation 97%, lungs with bilateral rales with no wheezing, heart with S1 and S2 present and rhythmic, no abdominal distention and positive left stump pitting edema.   Na 133 K 3,8 CL 102 bicarbonate 20, glucose 131 BUN 80 cr 5,26  BNP 498  High sensitive troponin 31  Iron 72, transferrin saturation 32, ferritin 628.  Wbc 1,9, hgb 6,9 plt 57   Transfused one unit PRBC with follow up hgb at 7,6  Positive urinary retention, required in and out catheterization.   06/15 continue to have hypervolemia. Further drop in Hgb down to 6,8 and had PRBC transfusion.  06/16 patient with urinary retention but has refused to Foley catheter.    Assessment and Plan: * Pancytopenia (HCC) Symptomatic anemia.  Recent GI evaluation, recommended conservative management.  Follow up hgb down to 7.7  Sp PRBC transfusion for a total of 2 since admission.   Possible bone marrow failure considering leukopenia and thrombocytopenia. Poor prognosis.   Acute renal failure superimposed on stage 4 chronic kidney disease (HCC) Possible multifactorial.  Urine output documented to be 0. (Possibly not accurate).     Continue to have volume overload. Serum cr 5,31 with K at 4,2 and serum bicarbonate at 21. Na 135.   Non anion gap metabolic acidosis, continue with oral sodium bicarbonate.   Continue bladder scan, patient has declined foley catheter.   Acute on chronic heart failure with preserved ejection fraction (HFpEF) (HCC) Echocardiogram with preserved LV systolic function. EF 50 to 55%. Moderate LVH, RV systolic function preserved. Mild aortic valve stenosis.   Persistent edema bilateral lower extremities, +++  Had IV albumin with no significant improvement in diuresis.  Diuresis with IV furosemide 120 mg tid  Blood pressure control with amlodipine, carvedilol and hydralazine.   Essential hypertension Continue blood pressure control with carvedilol, amlodipine and hydralazine.   History of CVA (cerebrovascular accident) Continue supportive medical therapy. PT and OT. Patient will return to SNF when medically stable.   T2DM (type 2 diabetes mellitus) (HCC) Continue insulin sliding scale for glucose cover and monitoring. Fasting glucose this am 116 Continue to hold on basal insulin.   PAD (peripheral artery disease) (HCC) Left BKA.   Depression Continue with sertraline and PRN alprazolam.         Subjective: Patient continue to have dyspnea at rest and lower extremity edema, he had urinary retention, but had declined foley catheter.   Physical Exam: Vitals:   08/11/22 0027 08/11/22 0520 08/11/22 0741 08/11/22 0835  BP: 109/69 (!) 147/92  (!) 149/91  Pulse: 62 67  69  Resp: 18 18  20   Temp: 98.7 F (37.1 C) 98 F (36.7 C)  98 F (36.7 C)  TempSrc: Oral Oral  Oral  SpO2:   98%   Weight:  130.2 kg    Height:       Neurology awake and alert ENT with mild pallor Cardiovascular with S1 and S2 present, positive murmur at the right lower sternal border and apex, systolic. No JVD Positive  lower extremity edema +++ pitting on the left, and right thigh  Abdomen with no  distention  Data Reviewed:    Family Communication: no family at the bedside   Disposition: Status is: Inpatient Remains inpatient appropriate because: worsening renal failure, may need renal replacement therapy   Planned Discharge Destination: Skilled nursing facility    Author: Coralie Keens, MD 08/11/2022 10:09 AM  For on call review [email or web address].

## 2022-08-11 NOTE — Progress Notes (Addendum)
Patient ID: [name], male   DOB: [birth year], 61 y.o.   MRN: [id number] [facility] Progress Note   Assessment/ Plan:   1. Acute kidney Injury on chronic kidney disease stage IV: Appears to be hemodynamically mediated with decreased effective arterial blood volume in the setting of significant third spacing/diastolic heart failure.  This was likely exacerbated by his significant anemia.  Renal function essentially unchanged overnight with inaccurate urine output charting due to collection challenges; patient refuses indwelling Foley catheter.  I will increase furosemide to 120 mg 3 times daily for the next 24 hours and continue to follow clinical course.  Patient encouraged to urinate in bedside urinal for collection/quantification.  Based on his current functional status, he would be an extremely poor candidate for chronic/outpatient dialysis.  He had previously declined dialysis but was favoring to undertake it "if needed" when I saw him this admission. 2.  Pancytopenia: Without overt blood loss in the setting of thrombocytopenia, status post PRBC transfusion. 3.  Acute exacerbation of chronic diastolic heart failure: Possibly exacerbated by his anemia.  Diuretics transiently held in the setting of worsening renal function and will be escalated for volume unloading based on physical exam. 4.  Hypertension: Blood pressures marginally elevated on a combination of amlodipine, carvedilol and hydralazine.  Diuretic therapy escalated today.  Subjective:   Reports to be feeling somewhat better but continues to have shortness of breath with conversation.  Overnight events noted with refusal of Foley catheter placement.   Objective:   BP (!) 149/91   Pulse 69   Temp 98 F (36.7 C) (Oral)   Resp 18   Ht 5\' 8"  (1.727 m)   Wt 130.2 kg   SpO2 98%   BMI 43.64 kg/m   Intake/Output Summary (Last 24 hours) at 08/11/2022 0846 Last data filed at 08/10/2022 2138 Gross per 24 hour  Intake 743  ml  Output 0 ml  Net 743 ml   Weight change: 2.2 kg  Physical Exam: Gen: Sitting propped up in bed, dyspneic with conversation CVS: Pulse regular rhythm, normal rate, ejection systolic murmur audible over outflow tract Resp: Diminished breath sounds over bases with fine rales left side Abd: Soft, obese, nontender, bowel sounds normal Ext: Status post left below-knee amputation with dressing over posterior aspect of stump.  Right leg with 3+ edema and venous stasis changes  Imaging: No results found.  Labs: BMET Recent Labs  Lab 08/08/22 2028 08/09/22 0532 08/10/22 0031 08/11/22 0049  NA 133* 135 135 135  K 3.8 3.8 4.1 4.2  CL 102 103 103 102  CO2 20* 19* 20* 21*  GLUCOSE 131* 123* 146* 116*  BUN 80* 74* 78* 90*  CREATININE 5.26* 5.09* 5.30* 5.31*  CALCIUM 7.8* 8.0* 7.6* 7.5*  PHOS  --  5.7*  --   --    CBC Recent Labs  Lab 08/08/22 2028 08/09/22 0532 08/10/22 0031 08/11/22 0049  WBC 1.9* 1.9* 1.3* 1.3*  NEUTROABS 1.0*  --   --   --   HGB 6.9* 7.6* 6.8* 7.7*  HCT 22.1* 23.7* 21.3* 24.4*  MCV 90.2 88.8 88.4 86.8  PLT 57* 52* 45* 45*    Medications:     amLODipine  10 mg Oral Daily   aspirin  81 mg Oral Daily   atorvastatin  80 mg Oral QHS   carvedilol  12.5 mg Oral BID WC   feeding supplement  237 mL Oral BID BM   hydrALAZINE  50 mg Oral Q8H  insulin aspart  0-5 Units Subcutaneous QHS   insulin aspart  0-6 Units Subcutaneous TID WC   lidocaine  1 Application Urethral Once   multivitamin with minerals  1 tablet Oral Daily   pantoprazole  40 mg Oral Daily   sertraline  50 mg Oral QODAY   sodium bicarbonate  1,950 mg Oral BID   sodium chloride flush  3 mL Intravenous Q12H   Zetta Bills, MD 08/11/2022, 8:46 AM

## 2022-08-11 NOTE — Assessment & Plan Note (Addendum)
Continue with PRN alprazolam.  Patient requested to discontinue sertraline due to hallucinations.   Patient with persistent pain, multifactorial.  Continue with oral oxycodone.

## 2022-08-12 DIAGNOSIS — D61818 Other pancytopenia: Secondary | ICD-10-CM | POA: Diagnosis not present

## 2022-08-12 LAB — GLUCOSE, CAPILLARY
Glucose-Capillary: 123 mg/dL — ABNORMAL HIGH (ref 70–99)
Glucose-Capillary: 172 mg/dL — ABNORMAL HIGH (ref 70–99)
Glucose-Capillary: 172 mg/dL — ABNORMAL HIGH (ref 70–99)
Glucose-Capillary: 128 mg/dL — ABNORMAL HIGH (ref 70–99)

## 2022-08-12 LAB — BASIC METABOLIC PANEL
Anion gap: 15 (ref 5–15)
BUN: 88 mg/dL — ABNORMAL HIGH (ref 8–23)
CO2: 21 mmol/L — ABNORMAL LOW (ref 22–32)
Calcium: 7.6 mg/dL — ABNORMAL LOW (ref 8.9–10.3)
Chloride: 102 mmol/L (ref 98–111)
Creatinine, Ser: 5.41 mg/dL — ABNORMAL HIGH (ref 0.61–1.24)
GFR, Estimated: 11 mL/min — ABNORMAL LOW (ref >60)
Glucose, Bld: 122 mg/dL — ABNORMAL HIGH (ref 70–99)
Potassium: 4.5 mmol/L (ref 3.5–5.1)
Sodium: 138 mmol/L (ref 135–145)

## 2022-08-12 IMAGING — DX DG ABDOMEN 1V
2 series · 2 of 2 positions shown · non-contrast
Comparison: 12/01/2020 abdominal radiograph

CLINICAL DATA: Recent bowel decompression, abdominal distention

EXAM:
ABDOMEN - 1 VIEW

[abdomen kub (1 of 2)]
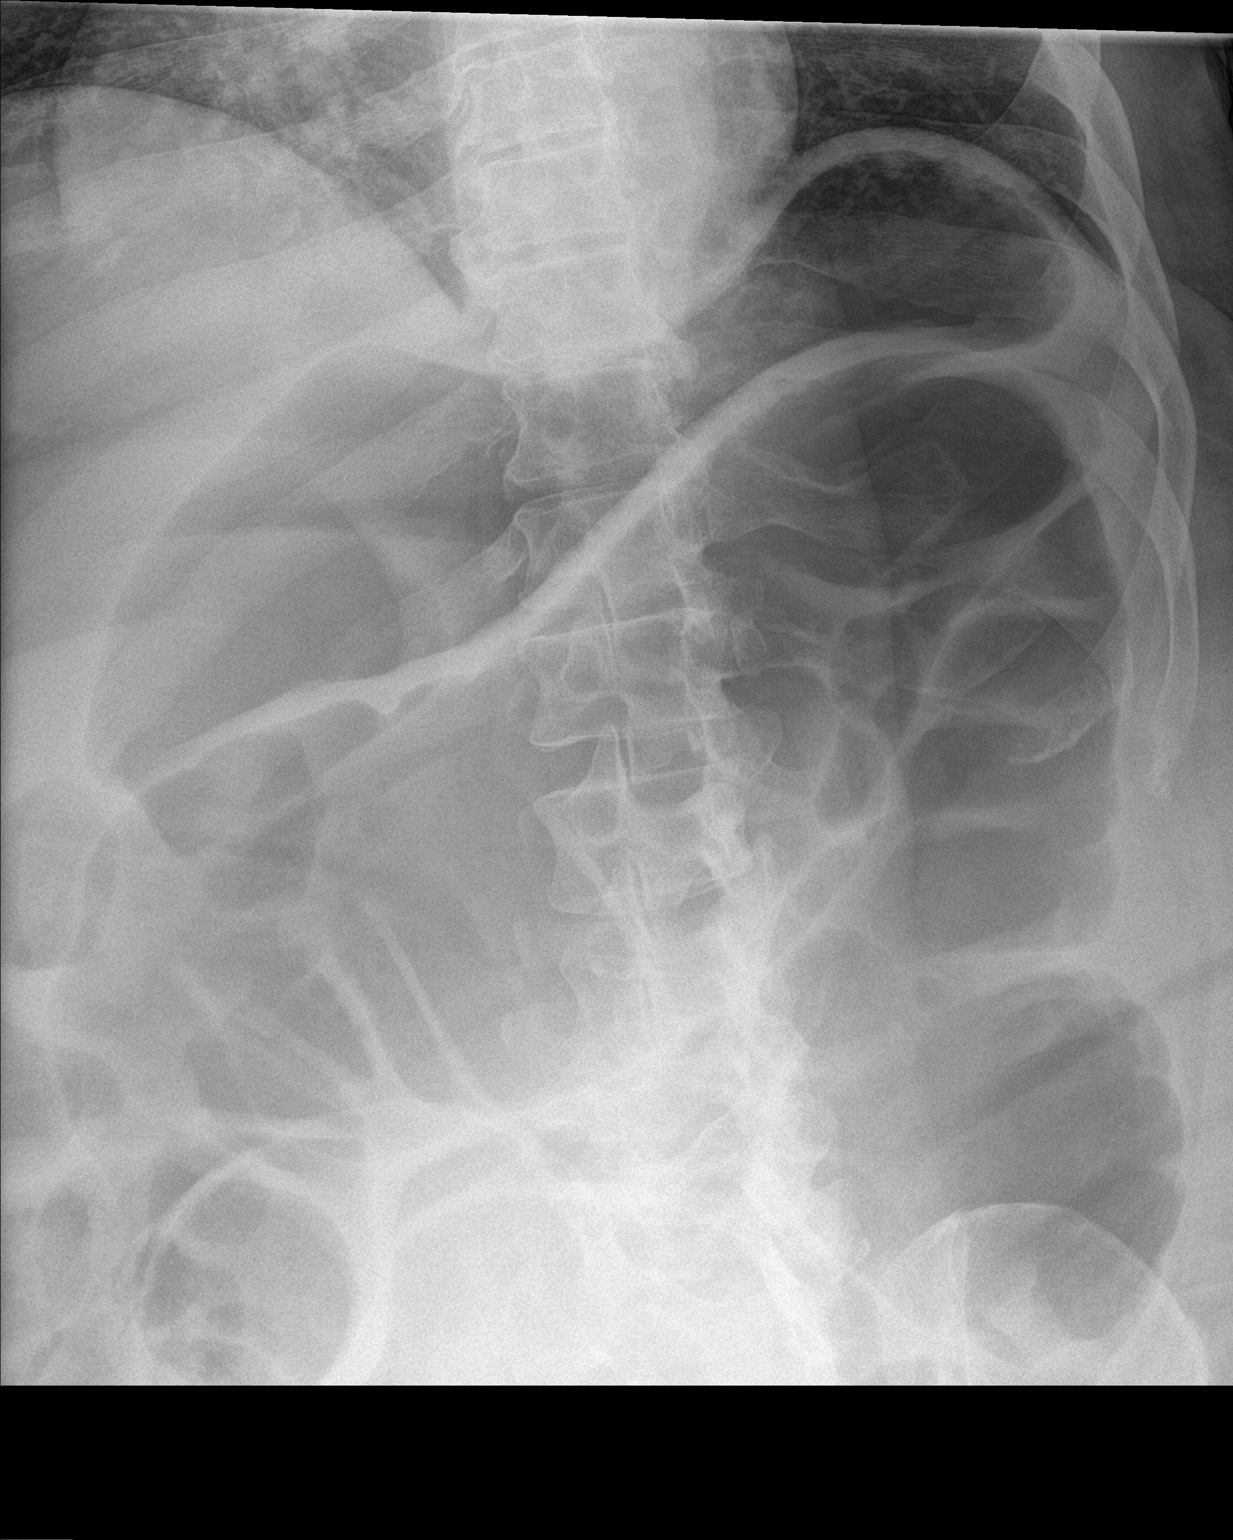

[abdomen kub (2 of 2)]
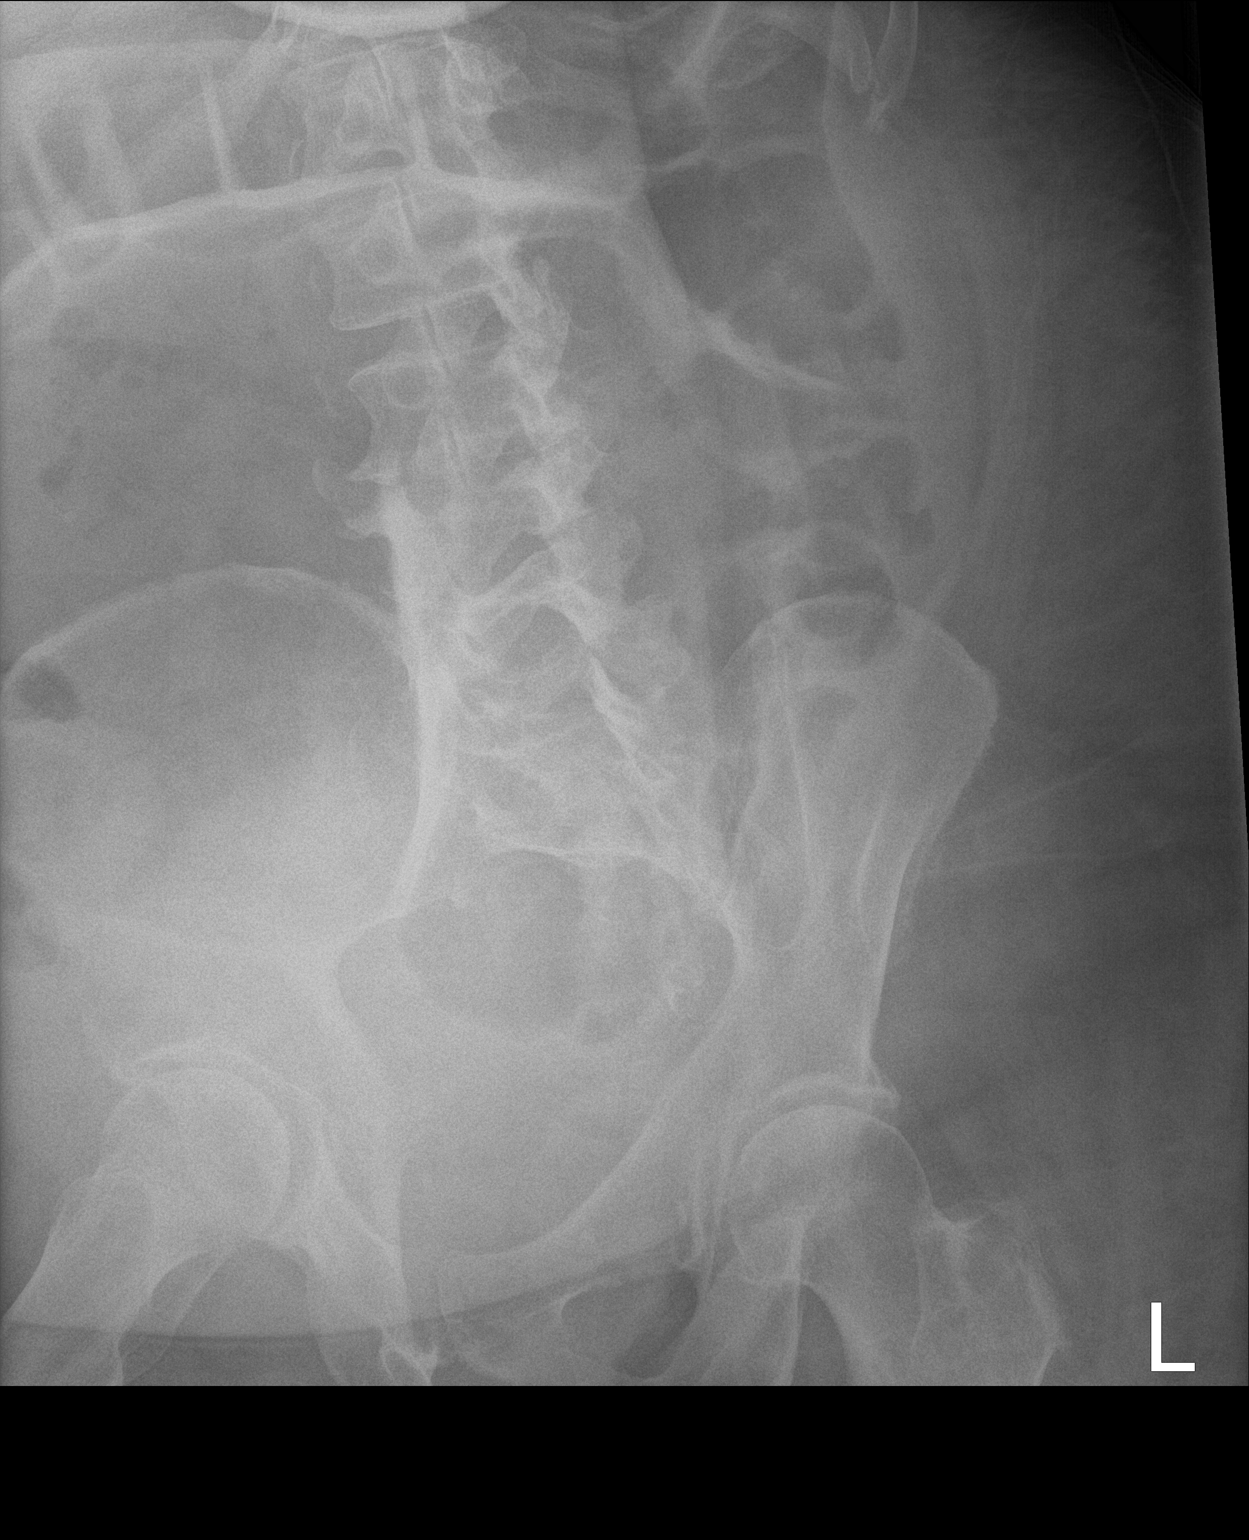

[2 of 2 positions shown; findings below may reference images not displayed]

FINDINGS: Marked diffuse gaseous distention of the colon, most prominent in
the transverse colon measuring 11.6 cm diameter, increased from
cm diameter. Marked gaseous distention of the visualized sigmoid
colon. The colonic distention appears worsened. No definite
pneumatosis or pneumoperitoneum. No significant small bowel
dilatation. No radiodense nephrolithiasis.
IMPRESSION: Marked diffuse gaseous distention of the colon, most prominent in
the transverse and sigmoid colon, worsened. No definite small bowel
dilatation. Findings suggest worsening adynamic ileus.

## 2022-08-12 MED ORDER — CHLORHEXIDINE GLUCONATE CLOTH 2 % EX PADS
6.0000 | MEDICATED_PAD | Freq: Every day | CUTANEOUS | Status: DC
  Administered 2022-08-12 – 2022-08-17 (×5): 6 via TOPICAL

## 2022-08-12 MED ORDER — TAMSULOSIN HCL 0.4 MG PO CAPS
0.4000 mg | ORAL_CAPSULE | Freq: Every day | ORAL | Status: DC
  Administered 2022-08-12 – 2022-09-04 (×23): 0.4 mg via ORAL
  Filled 2022-08-12 (×25): qty 1

## 2022-08-12 MED ORDER — FUROSEMIDE 10 MG/ML IJ SOLN
120.0000 mg | Freq: Two times a day (BID) | INTRAVENOUS | Status: DC
  Administered 2022-08-12 (×2): 120 mg via INTRAVENOUS
  Filled 2022-08-12 (×4): qty 12

## 2022-08-12 MED ORDER — METOLAZONE 5 MG PO TABS
5.0000 mg | ORAL_TABLET | Freq: Once | ORAL | Status: AC
  Administered 2022-08-12: 5 mg via ORAL
  Filled 2022-08-12: qty 1

## 2022-08-12 NOTE — Progress Notes (Signed)
Patient ID: [name], male   DOB: 1960-07-25, 62 y.o.   MRN: [id number] [facility] Progress Note   Assessment/ Plan:    1. Acute kidney Injury on chronic kidney disease stage IV: Appears to be hemodynamically mediated with decreased effective arterial blood volume in the setting of significant third spacing/diastolic heart failure.  This was likely exacerbated by his significant anemia.  Note history of inaccurate urine output charting due to collection challenges; patient refuses indwelling Foley catheter.   - Lasix 120 mg IV every 12 hours today then reassess diuretics daily  - Give metolazone 5 mg PO once today   - Strict ins/outs are ordered.   - Based on his current functional status, he would be an extremely poor candidate for chronic/outpatient dialysis.  He had previously declined dialysis but per charting earlier was favoring to undertake it "if needed".  He had previously refused a foley but did agree to placement overnight (note that dialysis is certainly more invasive than a foley)  2. Urinary retention - post-void residual and/or straight cath for 582 and 800 ml in the past. He initially refused foley but this is now in place (as of 6/17) - continue foley catheter  - start flomax  3.  Pancytopenia: Without overt blood loss.  Anemia is in the setting of thrombocytopenia, status post PRBC transfusion.  32% iron sat.  Recent GI evaluation and they have recommended conservative management.  Team concerned for possible bone marrow failure.  Note 10/2020 CT a/p with contrast was without focal hepatic abnormality.   - Start aranesp if agrees tomorrow.  He is a little overwhelmed today and didn't want to discuss other new meds aside from flomax  4.  Acute exacerbation of chronic diastolic heart failure: Possibly exacerbated by his anemia.  Diuretics transiently held in the setting of worsening renal function and then were escalated for volume unloading based on physical exam.   Diuretics as above   5.  Hypertension: Blood pressures marginally elevated.  Optimize volume as above.   Disposition - continue inpatient monitoring.  Advanced CKD and on IV diuretics.  Would consider palliative care consult per primary team   Subjective:   Noted previously has refused a foley catheter and ins/outs have been a challenge to collect.  Suspect may still not be accurate.  He had 1.3 liters UOP over 6/16 charted.  He has been getting lasix 120 mg IV TID (as of 6/16).  He did get a foley catheter in the interim since last exam.  He would like to start flomax - RN brought this up at bedside.  He and I discussed the risks and benefits and indications for ESA.  He states that he doesn't like taking medications and he feels like he is taking too many medications.  We discussed that he is near ESRD and that he has multiple active medical issues.  We discussed that not everyone pursues every aggressive option.   Review of systems:  Breathing is "about the same" - previously charted as short of breath at rest Denies chest pain  Denies nausea or vomiting     Objective:   BP (!) 164/86 (BP Location: Left Arm)   Pulse 67   Temp 98.4 F (36.9 C) (Oral)   Resp 18   Ht 5\' 8"  (1.727 m)   Wt 130.9 kg   SpO2 100%   BMI 43.88 kg/m   Intake/Output Summary (Last 24 hours) at 08/12/2022 0809 Last data filed at 08/12/2022  1610 Gross per 24 hour  Intake 543.83 ml  Output 1250 ml  Net -706.17 ml   Weight change: 0.7 kg  Physical Exam:   General adult male in bed in no acute distress HEENT normocephalic atraumatic extraocular movements intact sclera anicteric Neck supple trachea midline Lungs clear to auscultation bilaterally normal work of breathing at rest  Heart regular rate and rhythm no rubs or gallops appreciated Abdomen soft nontender nondistended Extremities 2+ edema bilateral lower extremities; left BKA; RUE edema; no LUE edema Psych normal mood and affect; question his insight   Neuro - alert and oriented x 3. Follows commands.  GU - foley catheter in place  Imaging: No results found.  Labs: BMET Recent Labs  Lab 08/08/22 2028 08/09/22 0532 08/10/22 0031 08/11/22 0049 08/12/22 0121  NA 133* 135 135 135 138  K 3.8 3.8 4.1 4.2 4.5  CL 102 103 103 102 102  CO2 20* 19* 20* 21* 21*  GLUCOSE 131* 123* 146* 116* 122*  BUN 80* 74* 78* 90* 88*  CREATININE 5.26* 5.09* 5.30* 5.31* 5.41*  CALCIUM 7.8* 8.0* 7.6* 7.5* 7.6*  PHOS  --  5.7*  --   --   --    CBC Recent Labs  Lab 08/08/22 2028 08/09/22 0532 08/10/22 0031 08/11/22 0049  WBC 1.9* 1.9* 1.3* 1.3*  NEUTROABS 1.0*  --   --   --   HGB 6.9* 7.6* 6.8* 7.7*  HCT 22.1* 23.7* 21.3* 24.4*  MCV 90.2 88.8 88.4 86.8  PLT 57* 52* 45* 45*    Medications:     amLODipine  10 mg Oral Daily   aspirin  81 mg Oral Daily   atorvastatin  80 mg Oral QHS   carvedilol  12.5 mg Oral BID WC   feeding supplement  237 mL Oral BID BM   hydrALAZINE  50 mg Oral Q8H   insulin aspart  0-5 Units Subcutaneous QHS   insulin aspart  0-6 Units Subcutaneous TID WC   lidocaine  1 Application Urethral Once   multivitamin with minerals  1 tablet Oral Daily   pantoprazole  40 mg Oral Daily   sertraline  50 mg Oral QODAY   sodium bicarbonate  1,950 mg Oral BID   sodium chloride flush  3 mL Intravenous Q12H   Estanislado Emms, MD 08/12/2022, 8:45 AM

## 2022-08-12 NOTE — Inpatient Diabetes Management (Signed)
Inpatient Diabetes Program Recommendations  AACE/ADA: New Consensus Statement on Inpatient Glycemic Control (2015)  Target Ranges:  Prepandial:   less than 140 mg/dL      Peak postprandial:   less than 180 mg/dL (1-2 hours)      Critically ill patients:  140 - 180 mg/dL   Lab Results  Component Value Date   GLUCAP 123 (H) 08/12/2022   HGBA1C 6.1 (H) 03/18/2022    Review of Glycemic Control  Latest Reference Range & Units 08/11/22 06:08 08/11/22 11:04 08/11/22 16:56 08/11/22 20:58 08/12/22 06:07  Glucose-Capillary 70 - 99 mg/dL 952 (H) 841 (H)  Novolog 3 units 140 (H) 352 (H)  Novolog 5 units 123 (H)   Diabetes history: DM 2 Outpatient Diabetes medications: Lantus 6 units, Novolog 2-10 units tid Current orders for Inpatient glycemic control: Novolog 0-6 units tid + hs  Note Glucose trends increase after supplement intake Ensure Enlive bid between meals (40 gm carbohydrate)  Inpatient Diabetes Program Recommendations:    -  May consider Novolog 2 units tid meal coverage if eating >50% of meals/supplements  Thanks,  Christena Deem RN, MSN, BC-ADM Inpatient Diabetes Coordinator Team Pager [phone] (8a-5p)

## 2022-08-12 NOTE — Progress Notes (Signed)
   CC:  Chief Complaint  Patient presents with   abnormal labs     Brief narrative:  [name] is a 62 y.o. male with a history of diabetes mellitus type 2, hypertension, CKD stage IV, peripheral vascular disease, CVA, heart failure.  Patient presented secondary to low hemoglobin lab value.  Patient transfused 1 unit PRBC with stable hemoglobin anemia complicated by overall pancytopenia which appears to be chronic.  Hospitalization complicated by evidence of AKI in addition to acute on chronic heart failure also present on admission.  Nephrology consulted.  Patient is a poor dialysis candidate.  Palliative care consulted.  Subjective:  Patient reports no specific issues overnight or this morning.  No dyspnea.  Objective:  BP (!) 162/84   Pulse 70   Temp 98.4 F (36.9 C) (Oral)   Resp 17   Ht 5\' 8"  (1.727 m)   Wt 130.9 kg   SpO2 96%   BMI 43.88 kg/m   General exam: Appears calm and comfortable Respiratory system: Clear to auscultation. Respiratory effort normal. Cardiovascular system: S1 & S2 heard, RRR. 2/6 systolic murmur Gastrointestinal system: Abdomen is nondistended, soft and nontender. No organomegaly or masses felt. Normal bowel sounds heard. Central nervous system: Alert and oriented. No focal neurological deficits. Musculoskeletal: Bilateral 2+ pitting edema. No calf tenderness. Left BKA. Right upper extremity edema. Psychiatry: Judgement and insight appear normal. Mood & affect appropriate.   Assessment/Plan:  Pancytopenia Patient has had a GI workup with recommendation for conservative management.  It does not appear patient has had a hematology consult for workup and management.  Patient is unaware of any hematology workup. -Would likely benefit from a hematology consult with possible bone marrow biopsy depending on goals of care discussions  AKI on CKD stage IV Present on admission.  Nephrology consulted and have increased diuresis.  Patient increased to  Lasix to 120 mg twice daily.  Nephrology, patient is a poor outpatient/chronic hemodialysis candidate. -Nephrology recommendations: Lasix 120 mg IV twice daily, metolazone 5 mg today  Acute on chronic diastolic heart failure Fluid management per nephrology.  Primary hypertension -Continue carvedilol, amlodipine, hydralazine  Diabetes mellitus type 2 -Continue sliding scale insulin  Peripheral artery disease Noted.  Patient has a history of left BKA. -Continue Lipitor, aspirin  Depression -Continue sertraline  Disposition: Discharge depending on continued nephrology recommendations in addition to palliative care goals of care discussions.   Jacquelin Hawking, MD [facility] 08/12/2022, 9:16 AM

## 2022-08-13 DIAGNOSIS — N179 Acute kidney failure, unspecified: Secondary | ICD-10-CM | POA: Diagnosis not present

## 2022-08-13 DIAGNOSIS — Z8673 Personal history of transient ischemic attack (TIA), and cerebral infarction without residual deficits: Secondary | ICD-10-CM | POA: Diagnosis not present

## 2022-08-13 DIAGNOSIS — E1122 Type 2 diabetes mellitus with diabetic chronic kidney disease: Secondary | ICD-10-CM

## 2022-08-13 DIAGNOSIS — I5033 Acute on chronic diastolic (congestive) heart failure: Secondary | ICD-10-CM | POA: Diagnosis not present

## 2022-08-13 DIAGNOSIS — I10 Essential (primary) hypertension: Secondary | ICD-10-CM | POA: Diagnosis not present

## 2022-08-13 DIAGNOSIS — D61818 Other pancytopenia: Secondary | ICD-10-CM | POA: Diagnosis not present

## 2022-08-13 LAB — CBC WITH DIFFERENTIAL/PLATELET
Abs Immature Granulocytes: 0.1 10*3/uL — ABNORMAL HIGH (ref 0.00–0.07)
Basophils Absolute: 0 10*3/uL (ref 0.0–0.1)
Basophils Relative: 2 %
Eosinophils Absolute: 0.1 10*3/uL (ref 0.0–0.5)
Eosinophils Relative: 8 %
HCT: 23 % — ABNORMAL LOW (ref 39.0–52.0)
Hemoglobin: 7.4 g/dL — ABNORMAL LOW (ref 13.0–17.0)
Lymphocytes Relative: 37 %
Lymphs Abs: 0.6 10*3/uL — ABNORMAL LOW (ref 0.7–4.0)
MCH: 28.6 pg (ref 26.0–34.0)
MCHC: 32.2 g/dL (ref 30.0–36.0)
MCV: 88.8 fL (ref 80.0–100.0)
Metamyelocytes Relative: 4 %
Monocytes Absolute: 0.1 10*3/uL (ref 0.1–1.0)
Monocytes Relative: 7 %
Myelocytes: 5 %
Neutro Abs: 0.6 10*3/uL — ABNORMAL LOW (ref 1.7–7.7)
Neutrophils Relative %: 37 %
Platelets: 48 10*3/uL — ABNORMAL LOW (ref 150–400)
RBC: 2.59 MIL/uL — ABNORMAL LOW (ref 4.22–5.81)
RDW: 17.3 % — ABNORMAL HIGH (ref 11.5–15.5)
WBC: 1.6 10*3/uL — ABNORMAL LOW (ref 4.0–10.5)
nRBC: 0 % (ref 0.0–0.2)

## 2022-08-13 LAB — BASIC METABOLIC PANEL
Anion gap: 16 — ABNORMAL HIGH (ref 5–15)
BUN: 97 mg/dL — ABNORMAL HIGH (ref 8–23)
CO2: 20 mmol/L — ABNORMAL LOW (ref 22–32)
Calcium: 7.5 mg/dL — ABNORMAL LOW (ref 8.9–10.3)
Chloride: 99 mmol/L (ref 98–111)
Creatinine, Ser: 5.58 mg/dL — ABNORMAL HIGH (ref 0.61–1.24)
GFR, Estimated: 11 mL/min — ABNORMAL LOW (ref >60)
Glucose, Bld: 143 mg/dL — ABNORMAL HIGH (ref 70–99)
Potassium: 4.1 mmol/L (ref 3.5–5.1)
Sodium: 135 mmol/L (ref 135–145)

## 2022-08-13 LAB — GLUCOSE, CAPILLARY
Glucose-Capillary: 159 mg/dL — ABNORMAL HIGH (ref 70–99)
Glucose-Capillary: 119 mg/dL — ABNORMAL HIGH (ref 70–99)
Glucose-Capillary: 134 mg/dL — ABNORMAL HIGH (ref 70–99)
Glucose-Capillary: 331 mg/dL — ABNORMAL HIGH (ref 70–99)

## 2022-08-13 LAB — LACTATE DEHYDROGENASE: LDH: 405 U/L — ABNORMAL HIGH (ref 98–192)

## 2022-08-13 IMAGING — DX DG ABDOMEN 1V
2 series · 2 of 2 positions shown · non-contrast
Comparison: Portable exam 5597 hours compared to 12/02/2020 as well
as multiple earlier exams

CLINICAL DATA: Abdominal distension

EXAM:
ABDOMEN - 1 VIEW

[abdomen kub (1 of 2)]
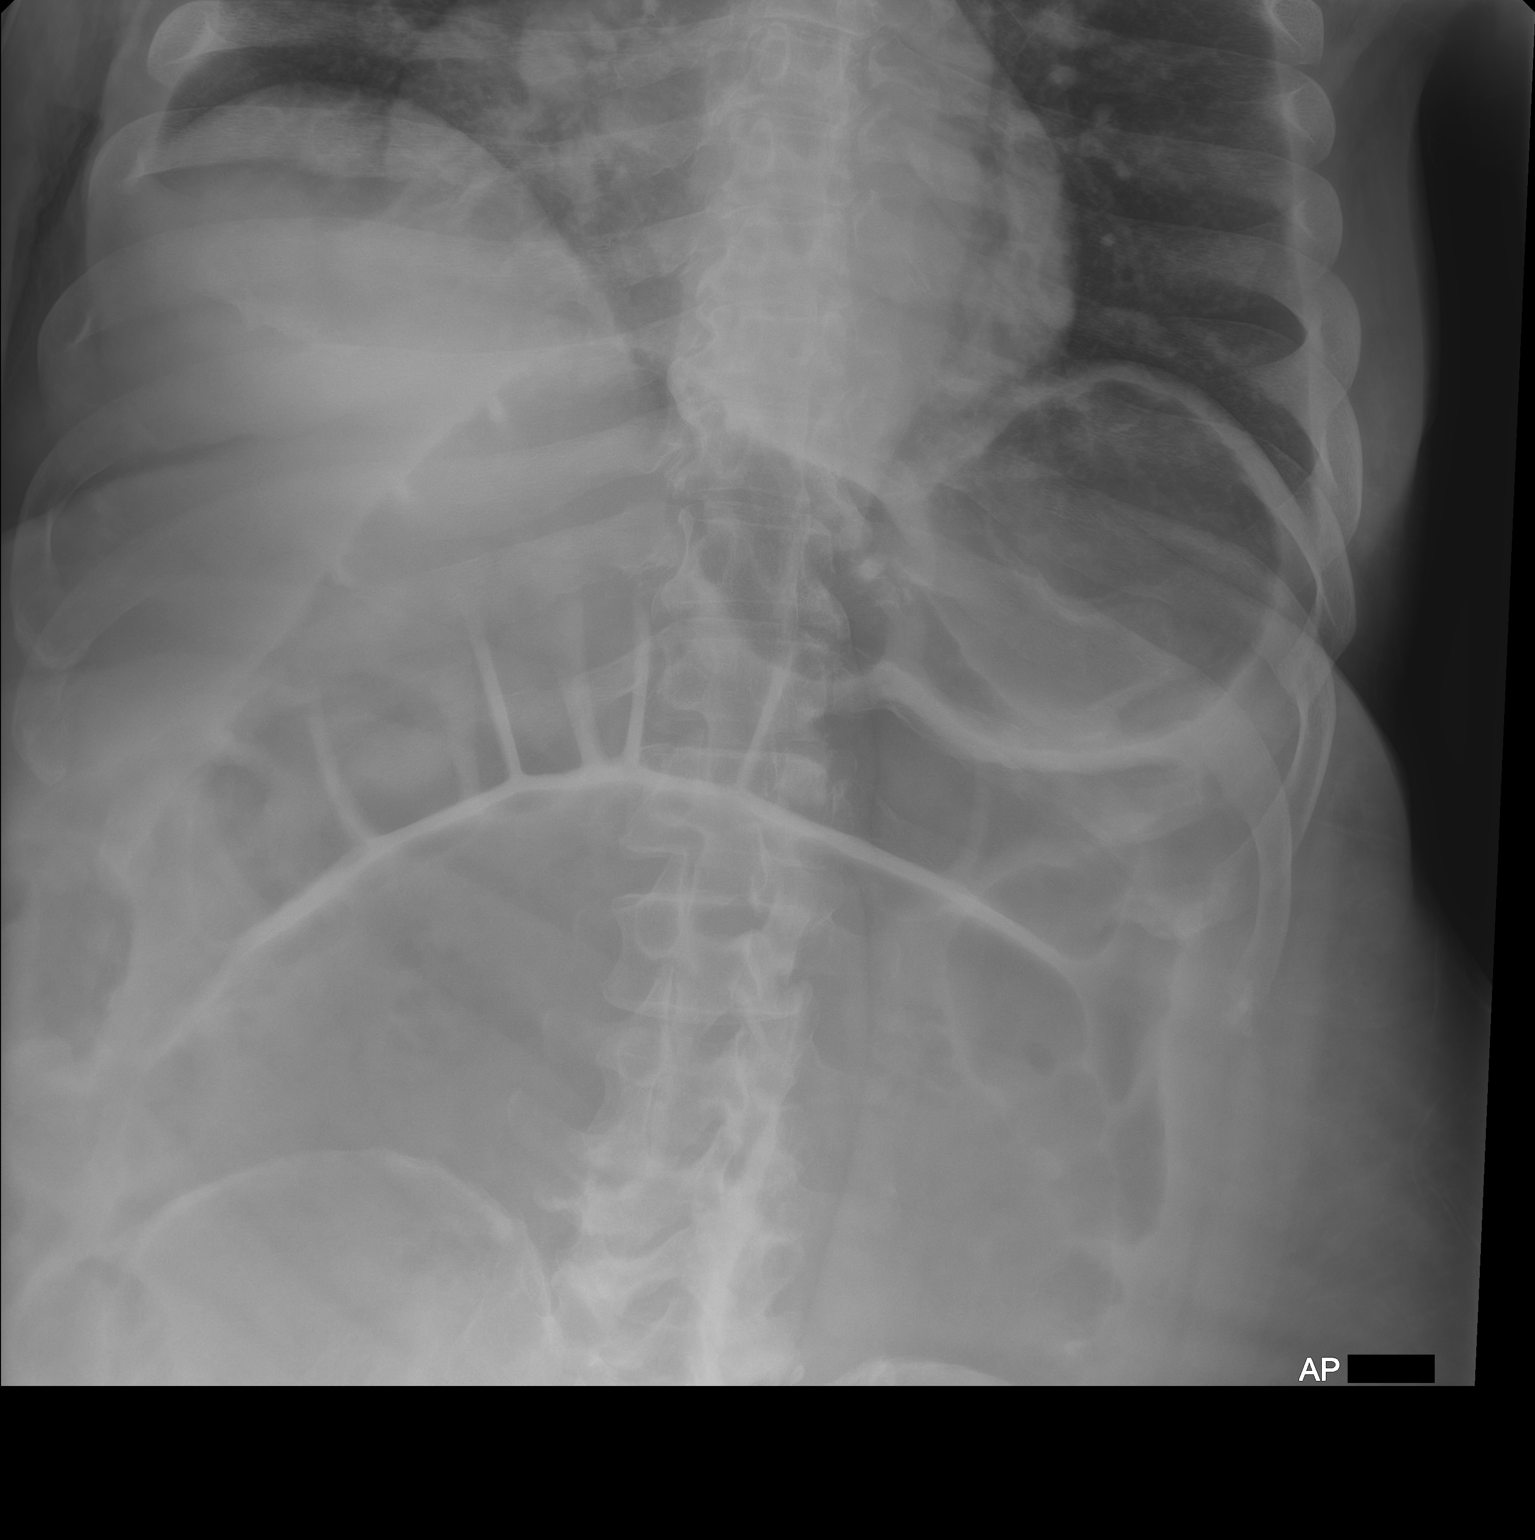

[abdomen kub (2 of 2)]
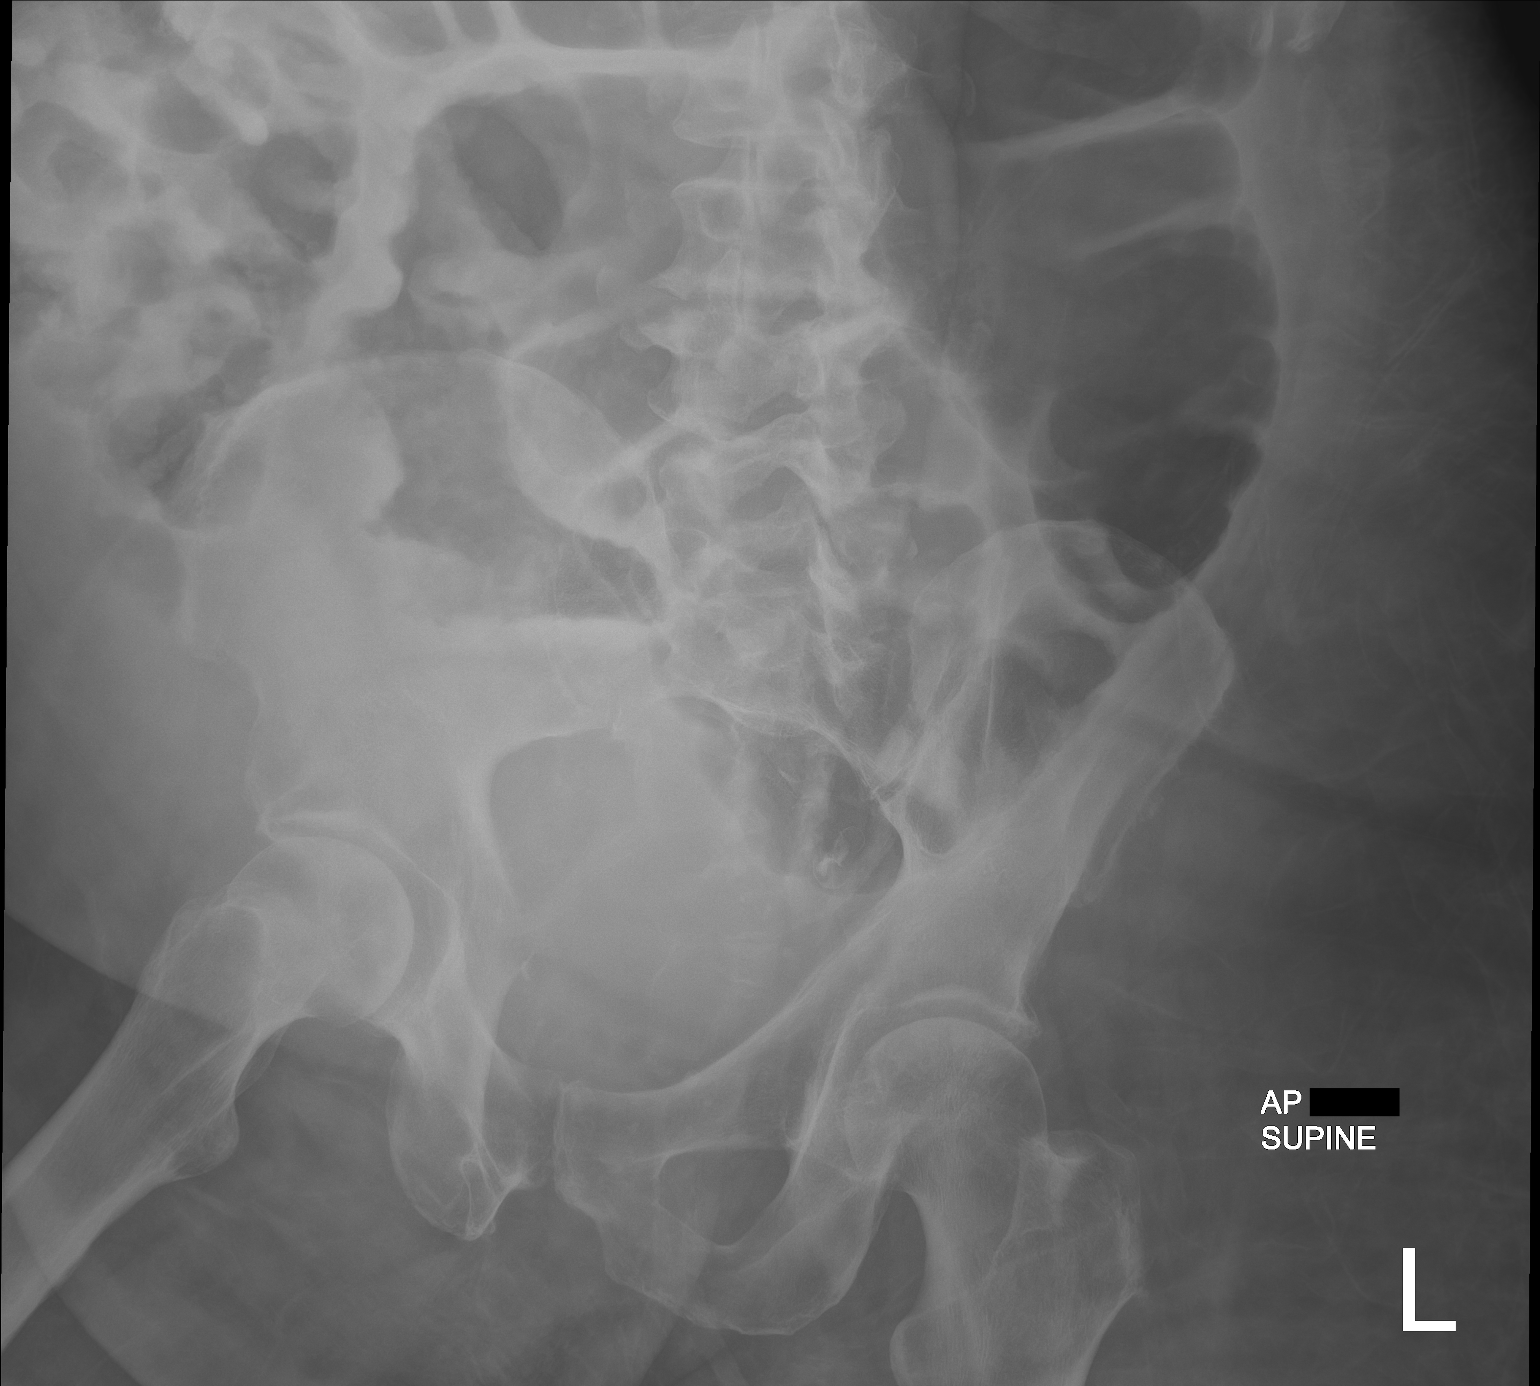

[2 of 2 positions shown; findings below may reference images not displayed]

FINDINGS: Diffuse gaseous distention of the colon again seen.

Transverse colon up to 11.9 cm transverse.

On 1 image the sigmoid colon is significantly distended up to
cm diameter but appears decompressed on second image.

Wall thickening of the sigmoid colon is identified, new.

No gross evidence of free intraperitoneal air on supine portable
exam.

No definite rectal gas.

This pattern of variable colonic distension as been present over
numerous exams since 11/17/2020. Pattern is most suggestive of
Trond Ivar syndrome.

Chronic elevation of RIGHT diaphragm.

Osseous structures unremarkable.
IMPRESSION: Chronic gaseous distention of colon, sigmoid colon up to 16.5 cm on
first image but decompressed on second image, with mild sigmoid wall
thickening identified.

Overall pattern and remains consistent with Trond Ivar syndrome.

Wall thickening of the sigmoid colon however is new since previous
exams, question edema versus colitis; consider CT assessment.

## 2022-08-13 MED ORDER — FUROSEMIDE 10 MG/ML IJ SOLN
120.0000 mg | Freq: Once | INTRAVENOUS | Status: AC
  Administered 2022-08-13: 120 mg via INTRAVENOUS
  Filled 2022-08-13: qty 2
  Filled 2022-08-13: qty 12

## 2022-08-13 NOTE — Consult Note (Signed)
Consultation Note Date: 08/13/2022   Patient Name: [name]  DOB: [birth year]  MRN: [id number]  Age / Sex: 62 y.o., male  PCP: Roy Monday, MD Referring Physician: Almon Hercules, MD  Reason for Consultation: Establishing goals of care  HPI/Patient Profile: 62 y.o. male   admitted on 08/08/2022 with past medical history significant for hypertension, insulin-dependent diabetes mellitus, history of CVA, PAD, and chronic HFpEF who presents from his SNF for evaluation of low hemoglobin.  He has been a SNF resident for about 4 years   Patient was admitted to the hospital roughly 3 weeks ago with acute on chronic HFpEF, acute hypoxic respiratory failure, and a new pancytopenia with positive FOBT.    He was seen by GI, transfused with 3 units of RBC, did not undergo any endoscopy, but was discharged with twice daily Protonix and recommendation for repeat CBC.   CBC was repeated at his SNF and he was sent to the ED for low hemoglobin.      ED Course: Upon arrival to the ED, patient is found to be afebrile with normal heart rate, stable blood pressure, and supplemental oxygen requirement.  Labs are most notable for BUN 80, creatinine 5.26, WBC 1900, hemoglobin 6.9, platelets 57,000, BNP 499, and negative FOBT.    Chest x-ray reveals cardiomegaly with vascular congestion, edema, and small pleural effusions.   Acute renal failure- Creatine 5.58 today   Of note:  patient brother and main support person died two days ago   Patient faces treatment option decisions, advanced directive decisions and anticipatory care need   Clinical Assessment and Goals of Care:  This NP [name] reviewed medical records, received report from team, assessed the patient and then meet at the patient's bedside  to discuss diagnosis, prognosis, GOC, EOL wishes disposition and options.   Concept of Palliative Care was  introduced as specialized medical care for people and their families living with serious illness.  If focuses on providing relief from the symptoms and stress of a serious illness.  The goal is to improve quality of life for both the patient and the family.  Values and goals of care important to patient  were attempted to be elicited.  Created space and opportunity for patient to explore thoughts and feelings regarding current medical situation. Patient tells me today he does not want to think about her talk about anything "my brother just died 2 days ago",  I shared with [name] concern for worsening renal function, and nephrology's concern that he is a poor candidate for dialysis.   Per their note based on his current functional status he would be an extremely poor candidate for outpatient dialysis given his habitus, mobility challenges and functional status.  It is noted that he declined dialysis and hospice in the past.  Education offered on his pancytopenia, concern for possible bone marrow failure.  WBC/ 1.6, platlets/ 48, Hemaglobin 7.4/ Neut/Lymphocyte 0.6.     A  discussion was had today regarding advanced directives.  The difference between a aggressive medical intervention path  and a palliative comfort care path for this patient at this time was had.    Education offered on hospice benefit; philosophy and eligibility.  Again there is little engagement in today's conversation from [name]     Questions and concerns addressed.  Patient  encouraged to call with questions or concerns.     PMT will continue to support holistically.       See patient's HPOA and Living Will in [place].  His brother [name] was named H POA, sadly his brother did die a few days ago.  I did reach out was able to speak to his sister-in-law [name], she agrees to be his main support person if indeed the patient himself desires so.  I tried to discuss this with the patient however  at this time he declines any decisions and tells me he will talk to Tawana himself       SUMMARY OF RECOMMENDATIONS    Code Status/Advance Care Planning: DNR    Palliative Prophylaxis:  Bowel Regimen, Delirium Protocol, Frequent Pain Assessment, and Oral Care  Additional Recommendations (Limitations, Scope, Preferences): Full Scope Treatment  Psycho-social/Spiritual:  Desire for further Chaplaincy support:no-declined Additional Recommendations: Education on Hospice  Prognosis:  Unable to determine--if dialysis is not an option for [name], and his worsening renal function he clearly is hospice eligible  Discharge Planning: To Be Determined      Primary Diagnoses: Present on Admission:  Acute renal failure superimposed on stage 4 chronic kidney disease (HCC)  Pancytopenia (HCC)  PAD (peripheral artery disease) (HCC)  Essential hypertension  Acute on chronic heart failure with preserved ejection fraction (HFpEF) (HCC)  Depression   I have reviewed the medical record, interviewed the patient and family, and examined the patient. The following aspects are pertinent.  Past Medical History:  Diagnosis Date   Acute on chronic diastolic CHF (congestive heart failure) (HCC) 08/22/2020   Acute on chronic heart failure with preserved ejection fraction (HCC)    Acute renal failure superimposed on stage 3a chronic kidney disease (HCC) 11/18/2020   Arthritis    Bacteremia due to Enterococcus 12/09/2021   Bipolar affective (HCC)    CHF (congestive heart failure) (HCC)    Claustrophobia    Severe   Cocaine abuse (HCC) 05/31/2014   Cocaine dependence, uncomplicated (HCC) 05/31/2014   Depression    Diabetes mellitus    uncontrolled    Elevated CK    Heart murmur    Hypertension    Ileus (HCC) 11/18/2020   Metabolic acidosis 10/23/2021   Non-traumatic rhabdomyolysis 01/01/2020   Pericardial effusion 08/22/2020   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   Suicidal ideations 08/28/2013   Social History   Socioeconomic History   Marital status: Single    Spouse name: Not on file   Number of children: Not on file   Years of education: Not on file   Highest education level: Not on file  Occupational History   Not on file  Tobacco Use   Smoking status: Former   Smokeless tobacco: Never  Vaping Use   Vaping Use: Never used  Substance and Sexual Activity   Alcohol use: No   Drug use: Not Currently    Types: Cocaine    Comment: Last use 2019   Sexual activity: Yes    Birth control/protection: Condom  Other Topics Concern   Not on file  Social History Narrative  Moved from [place]   Denies smoking, drinking   +cocaine history (denies recent use)   Originally from [place]    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[employer]   No kids, married                      Social Determinants of Corporate investment banker Strain: Not on file  Food Insecurity: No Food Insecurity (07/18/2022)   Hunger Vital Sign    Worried About Running Out of Food in the Last Year: Never true    Ran Out of Food in the Last Year: Never true  Transportation Needs: No Transportation Needs (07/18/2022)   PRAPARE - Administrator, Civil Service (Medical): No    Lack of Transportation (Non-Medical): No  Physical Activity: Not on file  Stress: Not on file  Social Connections: Not on file   Family History  Problem Relation Age of Onset   Diabetes Other    Cancer Mother        uterus; deceased    Heart attack Mother 29   Hypertension Other    Heart attack Father 66   Stroke Sister        12/2012 age 103 y.o   Scheduled Meds:  amLODipine  10 mg Oral Daily   aspirin  81 mg Oral Daily   atorvastatin  80 mg Oral QHS   carvedilol  12.5 mg Oral BID WC   Chlorhexidine Gluconate Cloth  6 each Topical Daily   feeding supplement  237 mL Oral BID BM   hydrALAZINE  50 mg Oral Q8H    insulin aspart  0-5 Units Subcutaneous QHS   insulin aspart  0-6 Units Subcutaneous TID WC   multivitamin with minerals  1 tablet Oral Daily   pantoprazole  40 mg Oral Daily   sertraline  50 mg Oral QODAY   sodium bicarbonate  1,950 mg Oral BID   sodium chloride flush  3 mL Intravenous Q12H   tamsulosin  0.4 mg Oral QPC supper   Continuous Infusions:  furosemide     PRN Meds:.acetaminophen **OR** acetaminophen, albuterol, ALPRAZolam, HYDROmorphone (DILAUDID) injection, mouth rinse, white petrolatum Medications Prior to Admission:  Prior to Admission medications   Medication Sig Start Date End Date Taking? Authorizing Provider  acetaminophen (TYLENOL) 650 MG CR tablet Take 650 mg by mouth every 4 (four) hours as needed (for discomfort- cannot exceed 3,000 mg from all combined sources/24 hours).   Yes [provider]  albuterol (VENTOLIN HFA) 108 (90 Base) MCG/ACT inhaler Inhale 2 puffs into the lungs every 6 (six) hours as needed for wheezing or shortness of breath.   Yes [provider]  amLODipine (NORVASC) 10 MG tablet Take 1 tablet (10 mg total) by mouth daily. Patient taking differently: Take 10 mg by mouth See admin instructions. Take 10 mg by mouth once a day and hold if Systolic is <100 or Diastolic <16. Call NP is B/P is over 160/90. 12/11/21  Yes [name], [name], DO  aspirin 81 MG chewable tablet Chew 81 mg by mouth daily.   Yes [provider]  atorvastatin (LIPITOR) 80 MG tablet Take 80 mg by mouth at bedtime.   Yes [provider]  brimonidine (ALPHAGAN) 0.2 % ophthalmic solution Place 1 drop into both eyes 2 (two) times daily.   Yes [provider]  carvedilol (COREG) 12.5 MG tablet Take 1 tablet (12.5 mg total) by  mouth 2 (two) times daily with a meal. 04/02/22  Yes [name], [name], MD  docusate sodium (COLACE) 100 MG capsule Take 100 mg by mouth at bedtime.   Yes [provider]  dorzolamide (TRUSOPT) 2 % ophthalmic  solution Place 1 drop into both eyes 2 (two) times daily.   Yes [provider]  ferrous sulfate 325 (65 FE) MG tablet Take 1 tablet (325 mg total) by mouth daily. 09/23/20  Yes [name], [name], DO  hydrALAZINE (APRESOLINE) 50 MG tablet Take 1 tablet (50 mg total) by mouth every 8 (eight) hours. Patient taking differently: Take 50 mg by mouth See admin instructions. Take 50 mg by mouth at 4 AM, 10 AM, 4 PM, and 10 PM- hold for a Systolic reading less than 100 04/02/22  Yes [name], [name], MD  insulin aspart (NOVOLOG FLEXPEN) 100 UNIT/ML FlexPen Inject 2-10 Units into the skin 3 (three) times daily before meals. Inject 2-10 units into the skin three times a day before meals, per sliding scale: BGL 131-180 = 2 units; 181-240 = 4 units; 241-300 = 6 units; 301-350 = 8 units; 351-400 = 10 units; >400 = CALL MD   Yes [provider]  LANTUS SOLOSTAR 100 UNIT/ML Solostar Pen Inject 6 Units into the skin in the morning.   Yes [provider]  latanoprost (XALATAN) 0.005 % ophthalmic solution Place 1 drop into both eyes at bedtime.   Yes [provider]  Multiple Vitamin (MULTIVITAMIN) tablet Take 1 tablet by mouth daily.   Yes [provider]  ondansetron (ZOFRAN) 4 MG tablet Take 4 mg by mouth every 4 (four) hours as needed for nausea or vomiting.   Yes [provider]  OXYGEN Inhale 2 L/min into the lungs as needed (for sats below 90%).   Yes [provider]  pantoprazole (PROTONIX) 40 MG tablet Take 1 tablet (40 mg total) by mouth 2 (two) times daily. 07/20/22  Yes [name], Eric J, DO  potassium chloride SA (KLOR-CON M) 20 MEQ tablet Take 20 mEq by mouth in the morning.   Yes [provider]  pregabalin (LYRICA) 75 MG capsule Take 1 capsule (75 mg total) by mouth daily. 10/31/21  Yes Maxwell, Allee, MD  PROTEIN PO Take 30 mLs by mouth 2 (two) times daily.   Yes [provider]  PROTEIN PO Take 30 mLs by mouth 2 (two) times  daily. 0900 and 2100   Yes [provider]  sertraline (ZOLOFT) 50 MG tablet Take 50 mg by mouth every other day.   Yes [provider]  sodium bicarbonate 650 MG tablet Take 2 tablets (1,300 mg total) by mouth 2 (two) times daily. 10/31/21  Yes Alfredo Martinez, MD  tamsulosin (FLOMAX) 0.4 MG CAPS capsule Take 0.8 mg by mouth every evening.   Yes [provider]  torsemide (DEMADEX) 20 MG tablet Take 40-80 mg by mouth See admin instructions. Take 80 mg by mouth at 8 AM and 40 mg by mouth at 7 PM   Yes [provider]   Allergies  Allergen Reactions   Other Other (See Comments)    Grits- reaction not listed on MAR   Sertraline Other (See Comments)    Reaction not known   Review of Systems  Constitutional:        "I'm tired my brother died two days  ago "    Physical Exam Cardiovascular:     Rate and Rhythm: Normal rate.  Pulmonary:  Effort: Pulmonary effort is normal.  Musculoskeletal:     Comments: L -BKA  Bilateral lower extremity edema  Skin:    General: Skin is warm and dry.  Neurological:     Mental Status: He is alert and oriented to person, place, and time.     Vital Signs: BP (!) 151/82 (BP Location: Left Wrist)   Pulse 76   Temp 98.6 F (37 C) (Oral)   Resp 15   Ht 5\' 8"  (1.727 m)   Wt 131 kg   SpO2 91%   BMI 43.93 kg/m  Pain Scale: 0-10 POSS *See Group Information*: 2-Acceptable,Slightly drowsy, easily aroused Pain Score: 0-No pain   SpO2: SpO2: 91 % O2 Device:SpO2: 91 % O2 Flow Rate: .O2 Flow Rate (L/min): 2 L/min  IO: Intake/output summary:  Intake/Output Summary (Last 24 hours) at 08/13/2022 1138 Last data filed at 08/13/2022 1000 Gross per 24 hour  Intake 352.33 ml  Output 3700 ml  Net -3347.67 ml    LBM: Last BM Date : 08/13/22 Baseline Weight: Weight: 124.6 kg Most recent weight: Weight: 131 kg     Palliative Assessment/Data: 40 %      Time   75 minutes Signed by: Roy Creed, NP   Please  contact Palliative Medicine Team phone at [phone] for questions and concerns.  For individual provider: See Loretha Stapler

## 2022-08-13 NOTE — Progress Notes (Addendum)
Patient ID: [name], male   DOB: 04/24/1960, 62 y.o.   MRN: [id number] [facility] Progress Note   Assessment/ Plan:    1. Acute kidney Injury on chronic kidney disease stage IV: Appears to be hemodynamically mediated with decreased effective arterial blood volume in the setting of significant third spacing/diastolic heart failure.  This was likely exacerbated by his significant anemia.  Note history of inaccurate urine output charting due to collection challenges; he previously refused a foley but did have one placed as of 6/17 exam - Lasix 120 mg IV once today.  He got metolazone once on 6/17 - Strict ins/outs and daily weights  - Based on his current functional status, I am concerned that he would be an extremely poor candidate for chronic/outpatient dialysis given his habitus, mobility challenges, and as well as functional status.  He had previously declined dialysis and hospice had been recommended on a prior admission should renal failure progress.  He had actually previously refused a foley but did ultimately agree to placement (note that dialysis is certainly more invasive than a foley).  I have recommended that the team consult palliative care to discuss his goals and in light of worsening renal function   2. Urinary retention - post-void residual and/or straight cath for 582 and 800 ml in the past. He initially refused foley but this is now in place (as of 6/17) - continue foley catheter  - we started flomax on 6/17  3.  Pancytopenia: Without overt blood loss.  Anemia is in the setting of thrombocytopenia, status post PRBC transfusion.  32% iron sat.  Recent GI evaluation and they have recommended conservative management.  Team concerned for possible bone marrow failure.  Note 10/2020 CT a/p with contrast was without focal hepatic abnormality.   - he indicated that he felt like he was taking too many medications when I discussed aranesp with him - will reassess  4.  Acute  exacerbation of chronic diastolic heart failure: Possibly exacerbated by his anemia.  Diuretics transiently held in the setting of worsening renal function and then were escalated for volume unloading based on physical exam.  Diuretics as above   5.  Hypertension: Blood pressures improved.  Optimize volume as above.   Disposition - continue inpatient monitoring.  Advanced CKD and on IV diuretics. See that team has consulted palliative care - appreciate their assistance.     Subjective:    He had 2 liters UOP over 6/17 as well as 1.3 liters thus far today.  He got metolazone 5 mg once on 6/17 and has been on lasix 120 mg IV BID.  I spoke with him today about having recommended the palliative consult.  He states that he lives at a SNF and has lived there for more than a year - he cannot tell me how long.  He states that he is not able to walk, citing his leg amputation  Review of systems:   Breathing is "alright".  He has been on oxygen   Denies chest pain  Denies nausea or vomiting     Objective:   BP (!) 148/91 (BP Location: Left Wrist)   Pulse 66   Temp 98.1 F (36.7 C) (Oral)   Resp 13   Ht 5\' 8"  (1.727 m)   Wt 131 kg   SpO2 99%   BMI 43.93 kg/m   Intake/Output Summary (Last 24 hours) at 08/13/2022 1013 Last data filed at 08/13/2022 4098 Gross per 24 hour  Intake 352.33 ml  Output 3300 ml  Net -2947.67 ml   Weight change: 0.144 kg  Physical Exam:    General adult male in bed in no acute distress HEENT normocephalic atraumatic extraocular movements intact sclera anicteric Neck supple trachea midline Lungs clear and reduced to auscultation bilaterally normal work of breathing at rest on 2 liters oxygen Heart S1S2  Abdomen soft nontender nondistended Extremities 1-2+ edema bilateral lower extremities, lymphedema; left BKA; RUE edema; no LUE edema Psych normal mood and affect; question his insight  Neuro - alert and oriented x 3. Follows commands.  GU - foley catheter in  place  Imaging: No results found.  Labs: BMET Recent Labs  Lab 08/08/22 2028 08/09/22 0532 08/10/22 0031 08/11/22 0049 08/12/22 0121 08/13/22 0036  NA 133* 135 135 135 138 135  K 3.8 3.8 4.1 4.2 4.5 4.1  CL 102 103 103 102 102 99  CO2 20* 19* 20* 21* 21* 20*  GLUCOSE 131* 123* 146* 116* 122* 143*  BUN 80* 74* 78* 90* 88* 97*  CREATININE 5.26* 5.09* 5.30* 5.31* 5.41* 5.58*  CALCIUM 7.8* 8.0* 7.6* 7.5* 7.6* 7.5*  PHOS  --  5.7*  --   --   --   --    CBC Recent Labs  Lab 08/08/22 2028 08/09/22 0532 08/10/22 0031 08/11/22 0049 08/13/22 0036  WBC 1.9* 1.9* 1.3* 1.3* 1.6*  NEUTROABS 1.0*  --   --   --  0.6*  HGB 6.9* 7.6* 6.8* 7.7* 7.4*  HCT 22.1* 23.7* 21.3* 24.4* 23.0*  MCV 90.2 88.8 88.4 86.8 88.8  PLT 57* 52* 45* 45* 48*    Medications:     amLODipine  10 mg Oral Daily   aspirin  81 mg Oral Daily   atorvastatin  80 mg Oral QHS   carvedilol  12.5 mg Oral BID WC   Chlorhexidine Gluconate Cloth  6 each Topical Daily   feeding supplement  237 mL Oral BID BM   hydrALAZINE  50 mg Oral Q8H   insulin aspart  0-5 Units Subcutaneous QHS   insulin aspart  0-6 Units Subcutaneous TID WC   multivitamin with minerals  1 tablet Oral Daily   pantoprazole  40 mg Oral Daily   sertraline  50 mg Oral QODAY   sodium bicarbonate  1,950 mg Oral BID   sodium chloride flush  3 mL Intravenous Q12H   tamsulosin  0.4 mg Oral QPC supper   Estanislado Emms, MD 08/13/2022, 10:42 AM

## 2022-08-13 NOTE — Progress Notes (Signed)
PROGRESS NOTE  [name] ZOX:[id number] DOB: [birth year]   PCP: [name], MD  Patient is from: SNF.  DOA: 08/08/2022 LOS: 5  Chief complaints Chief Complaint  Patient presents with   abnormal labs      Brief Narrative / Interim history:  62 y.o. male with a history of diabetes mellitus type 2, hypertension, CKD stage IV, PVD/left BKA, CVA, heart failure and pancytopenia.  Patient presented secondary to low hemoglobin.  He was transfused 1 unit with improvement in hemoglobin.  Hospitalization complicated by AKI on CKD-4 and acute on chronic CHF.  Nephrology consulted and started on Lasix drip.  AKI worse.  He did not seem to be a good candidate for hemodialysis due to his functional status.  Palliative care consulted.    Subjective: Seen and examined earlier this morning.  Patient is somewhat lethargic and not engaging much.  Responds yes to shortness of breath.  Objective: Vitals:   08/13/22 0734 08/13/22 0807 08/13/22 1109 08/13/22 1312  BP: (!) 148/91  (!) 151/82 138/77  Pulse: 68 66 76   Resp: 13 13 15    Temp: 98.1 F (36.7 C)  98.6 F (37 C)   TempSrc: Oral  Oral   SpO2: 98% 99% 91%   Weight:      Height:        Examination:  GENERAL: No apparent distress.  Nontoxic. HEENT: MMM.  Vision and hearing grossly intact.  NECK: Supple.  No apparent JVD.  RESP:  No IWOB.  Fair aeration bilaterally. CVS:  RRR. Heart sounds normal.  ABD/GI/GU: BS+. Abd soft, NTND.  MSK/EXT: Left BKA.  Chronic venous insufficiency.  1-2 edema. SKIN: no apparent skin lesion or wound NEURO: Awake, alert and oriented appropriately.  No apparent focal neuro deficit. PSYCH: Calm.  No distress or agitation.  Procedures:  None  Microbiology summarized: None  Assessment and plan: Principal Problem:   Pancytopenia (HCC) Active Problems:   Acute renal failure superimposed on stage 4 chronic kidney disease (HCC)   Acute on chronic heart failure with preserved ejection fraction  (HFpEF) (HCC)   Essential hypertension   History of CVA (cerebrovascular accident)   T2DM (type 2 diabetes mellitus) (HCC)   PAD (peripheral artery disease) (HCC)   Depression  Pancytopenia: Looks like chronic issue.  Anemia panel consistent with anemia of chronic disease.  No overt bleeding. -Check LDH and haptoglobin -Could benefit from Epo but defer this to nephrology   AKI on CKD stage IV/azotemia: Somewhat lethargic concerning for uremia.  Not a great candidate for HD due to poor functional status. Recent Labs    07/17/22 1815 07/18/22 0532 07/19/22 0513 07/20/22 0747 08/08/22 2028 08/09/22 0532 08/10/22 0031 08/11/22 0049 08/12/22 0121 08/13/22 0036  BUN 68* 63* 65* 61* 80* 74* 78* 90* 88* 97*  CREATININE 3.80* 3.85* 3.83* 3.74* 5.26* 5.09* 5.30* 5.31* 5.41* 5.58*  -Nephrology following-on IV Lasix and intermittent metolazone -Palliative consulted as recommended by nephro likely -Continue monitoring   Acute on chronic diastolic heart failure:  -Fluid management per nephrology.  Urinary retention: Foley catheter placed on 6/17. -Continue Foley catheter -Continue Flomax   Primary hypertension -Continue carvedilol, amlodipine, hydralazine   Diabetes mellitus type 2 -Continue sliding scale insulin   Peripheral artery disease/left BKA. -Continue Lipitor, aspirin   Depression -Continue sertraline   Goal of care: DNR/DNI.  Difficult to engage patient for further goals of care discussion. -Palliative medicine consultation pending.  Morbid obesity/moderate malnutrition Body mass index is 43.93 kg/m. Nutrition  Problem: Moderate Malnutrition Etiology: chronic illness (HF, PAD) Signs/Symptoms: mild fat depletion, mild muscle depletion, edema Interventions: Ensure Enlive (each supplement provides 350kcal and 20 grams of protein), MVI   DVT prophylaxis:  SCDs Start: 08/08/22 2241  Code Status: DNR/DNI Family Communication: None at bedside. Level of care:  Progressive Status is: Inpatient Remains inpatient appropriate because: Renal failure/acute on chronic CHF/pancytopenia   Consultants:  Nephrology Palliative medicine  55 minutes with more than 50% spent in reviewing records, counseling patient/family and coordinating care.   Sch Meds:  Scheduled Meds:  amLODipine  10 mg Oral Daily   aspirin  81 mg Oral Daily   atorvastatin  80 mg Oral QHS   carvedilol  12.5 mg Oral BID WC   Chlorhexidine Gluconate Cloth  6 each Topical Daily   feeding supplement  237 mL Oral BID BM   hydrALAZINE  50 mg Oral Q8H   insulin aspart  0-5 Units Subcutaneous QHS   insulin aspart  0-6 Units Subcutaneous TID WC   multivitamin with minerals  1 tablet Oral Daily   pantoprazole  40 mg Oral Daily   sertraline  50 mg Oral QODAY   sodium bicarbonate  1,950 mg Oral BID   sodium chloride flush  3 mL Intravenous Q12H   tamsulosin  0.4 mg Oral QPC supper   Continuous Infusions: PRN Meds:.acetaminophen **OR** acetaminophen, albuterol, ALPRAZolam, HYDROmorphone (DILAUDID) injection, mouth rinse, white petrolatum  Antimicrobials: Anti-infectives (From admission, onward)    None        I have personally reviewed the following labs and images: CBC: Recent Labs  Lab 08/08/22 2028 08/09/22 0532 08/10/22 0031 08/11/22 0049 08/13/22 0036  WBC 1.9* 1.9* 1.3* 1.3* 1.6*  NEUTROABS 1.0*  --   --   --  0.6*  HGB 6.9* 7.6* 6.8* 7.7* 7.4*  HCT 22.1* 23.7* 21.3* 24.4* 23.0*  MCV 90.2 88.8 88.4 86.8 88.8  PLT 57* 52* 45* 45* 48*   BMP &GFR Recent Labs  Lab 08/09/22 0532 08/10/22 0031 08/11/22 0049 08/12/22 0121 08/13/22 0036  NA 135 135 135 138 135  K 3.8 4.1 4.2 4.5 4.1  CL 103 103 102 102 99  CO2 19* 20* 21* 21* 20*  GLUCOSE 123* 146* 116* 122* 143*  BUN 74* 78* 90* 88* 97*  CREATININE 5.09* 5.30* 5.31* 5.41* 5.58*  CALCIUM 8.0* 7.6* 7.5* 7.6* 7.5*  MG 1.8  --   --   --   --   PHOS 5.7*  --   --   --   --    Estimated Creatinine Clearance:  18.4 mL/min (A) (by C-G formula based on SCr of 5.58 mg/dL (H)). Liver & Pancreas: Recent Labs  Lab 08/08/22 2028  AST 28  ALT 27  ALKPHOS 73  BILITOT 0.6  PROT 7.7  ALBUMIN 2.8*   No results for input(s): "LIPASE", "AMYLASE" in the last 168 hours. No results for input(s): "AMMONIA" in the last 168 hours. Diabetic: No results for input(s): "HGBA1C" in the last 72 hours. Recent Labs  Lab 08/12/22 1107 08/12/22 1628 08/12/22 2158 08/13/22 0606 08/13/22 1108  GLUCAP 172* 172* 128* 159* 119*   Cardiac Enzymes: No results for input(s): "CKTOTAL", "CKMB", "CKMBINDEX", "TROPONINI" in the last 168 hours. No results for input(s): "PROBNP" in the last 8760 hours. Coagulation Profile: No results for input(s): "INR", "PROTIME" in the last 168 hours. Thyroid Function Tests: No results for input(s): "TSH", "T4TOTAL", "FREET4", "T3FREE", "THYROIDAB" in the last 72 hours. Lipid Profile: No results for  input(s): "CHOL", "HDL", "LDLCALC", "TRIG", "CHOLHDL", "LDLDIRECT" in the last 72 hours. Anemia Panel: No results for input(s): "VITAMINB12", "FOLATE", "FERRITIN", "TIBC", "IRON", "RETICCTPCT" in the last 72 hours. Urine analysis:    Component Value Date/Time   COLORURINE YELLOW 08/09/2022 0530   APPEARANCEUR CLEAR 08/09/2022 0530   APPEARANCEUR Clear 12/31/2013 2116   LABSPEC 1.025 08/09/2022 0530   LABSPEC 1.032 12/31/2013 2116   PHURINE 5.5 08/09/2022 0530   GLUCOSEU NEGATIVE 08/09/2022 0530   GLUCOSEU >=500 12/31/2013 2116   HGBUR NEGATIVE 08/09/2022 0530   BILIRUBINUR NEGATIVE 08/09/2022 0530   BILIRUBINUR Negative 12/31/2013 2116   KETONESUR NEGATIVE 08/09/2022 0530   PROTEINUR 100 (A) 08/09/2022 0530   UROBILINOGEN 1.0 08/28/2013 1709   NITRITE NEGATIVE 08/09/2022 0530   LEUKOCYTESUR NEGATIVE 08/09/2022 0530   LEUKOCYTESUR Negative 12/31/2013 2116   Sepsis Labs: Invalid input(s): "PROCALCITONIN", "LACTICIDVEN"  Microbiology: No results found for this or any previous  visit (from the past 240 hour(s)).  Radiology Studies: No results found.    Marques Ericson T. Mohid Furuya [facility]  If 7PM-7AM, please contact night-coverage [email or web address] 08/13/2022, 2:19 PM

## 2022-08-14 DIAGNOSIS — D61818 Other pancytopenia: Secondary | ICD-10-CM | POA: Diagnosis not present

## 2022-08-14 DIAGNOSIS — E1122 Type 2 diabetes mellitus with diabetic chronic kidney disease: Secondary | ICD-10-CM | POA: Diagnosis not present

## 2022-08-14 DIAGNOSIS — I10 Essential (primary) hypertension: Secondary | ICD-10-CM | POA: Diagnosis not present

## 2022-08-14 DIAGNOSIS — I5033 Acute on chronic diastolic (congestive) heart failure: Secondary | ICD-10-CM | POA: Diagnosis not present

## 2022-08-14 LAB — RENAL FUNCTION PANEL
Albumin: 2.5 g/dL — ABNORMAL LOW (ref 3.5–5.0)
Anion gap: 13 (ref 5–15)
BUN: 100 mg/dL — ABNORMAL HIGH (ref 8–23)
CO2: 21 mmol/L — ABNORMAL LOW (ref 22–32)
Calcium: 7.5 mg/dL — ABNORMAL LOW (ref 8.9–10.3)
Chloride: 101 mmol/L (ref 98–111)
Creatinine, Ser: 5.55 mg/dL — ABNORMAL HIGH (ref 0.61–1.24)
GFR, Estimated: 11 mL/min — ABNORMAL LOW (ref >60)
Glucose, Bld: 122 mg/dL — ABNORMAL HIGH (ref 70–99)
Phosphorus: 5.5 mg/dL — ABNORMAL HIGH (ref 2.5–4.6)
Potassium: 3.8 mmol/L (ref 3.5–5.1)
Sodium: 135 mmol/L (ref 135–145)

## 2022-08-14 LAB — CBC
HCT: 22.7 % — ABNORMAL LOW (ref 39.0–52.0)
Hemoglobin: 7.1 g/dL — ABNORMAL LOW (ref 13.0–17.0)
MCH: 27.3 pg (ref 26.0–34.0)
MCHC: 31.3 g/dL (ref 30.0–36.0)
MCV: 87.3 fL (ref 80.0–100.0)
Platelets: 52 10*3/uL — ABNORMAL LOW (ref 150–400)
RBC: 2.6 MIL/uL — ABNORMAL LOW (ref 4.22–5.81)
RDW: 17.3 % — ABNORMAL HIGH (ref 11.5–15.5)
WBC: 1.4 10*3/uL — CL (ref 4.0–10.5)
nRBC: 0 % (ref 0.0–0.2)

## 2022-08-14 LAB — GLUCOSE, CAPILLARY
Glucose-Capillary: 228 mg/dL — ABNORMAL HIGH (ref 70–99)
Glucose-Capillary: 191 mg/dL — ABNORMAL HIGH (ref 70–99)
Glucose-Capillary: 140 mg/dL — ABNORMAL HIGH (ref 70–99)
Glucose-Capillary: 178 mg/dL — ABNORMAL HIGH (ref 70–99)

## 2022-08-14 LAB — PATHOLOGIST SMEAR REVIEW: Path Review: NEGATIVE

## 2022-08-14 LAB — MAGNESIUM: Magnesium: 2 mg/dL (ref 1.7–2.4)

## 2022-08-14 IMAGING — DX DG ABDOMEN 1V
2 series · 2 of 2 positions shown · non-contrast
Comparison: December 03, 2020.

CLINICAL DATA: Abdominal distension.

EXAM:
ABDOMEN - 1 VIEW

[abdomen kub (1 of 2)]
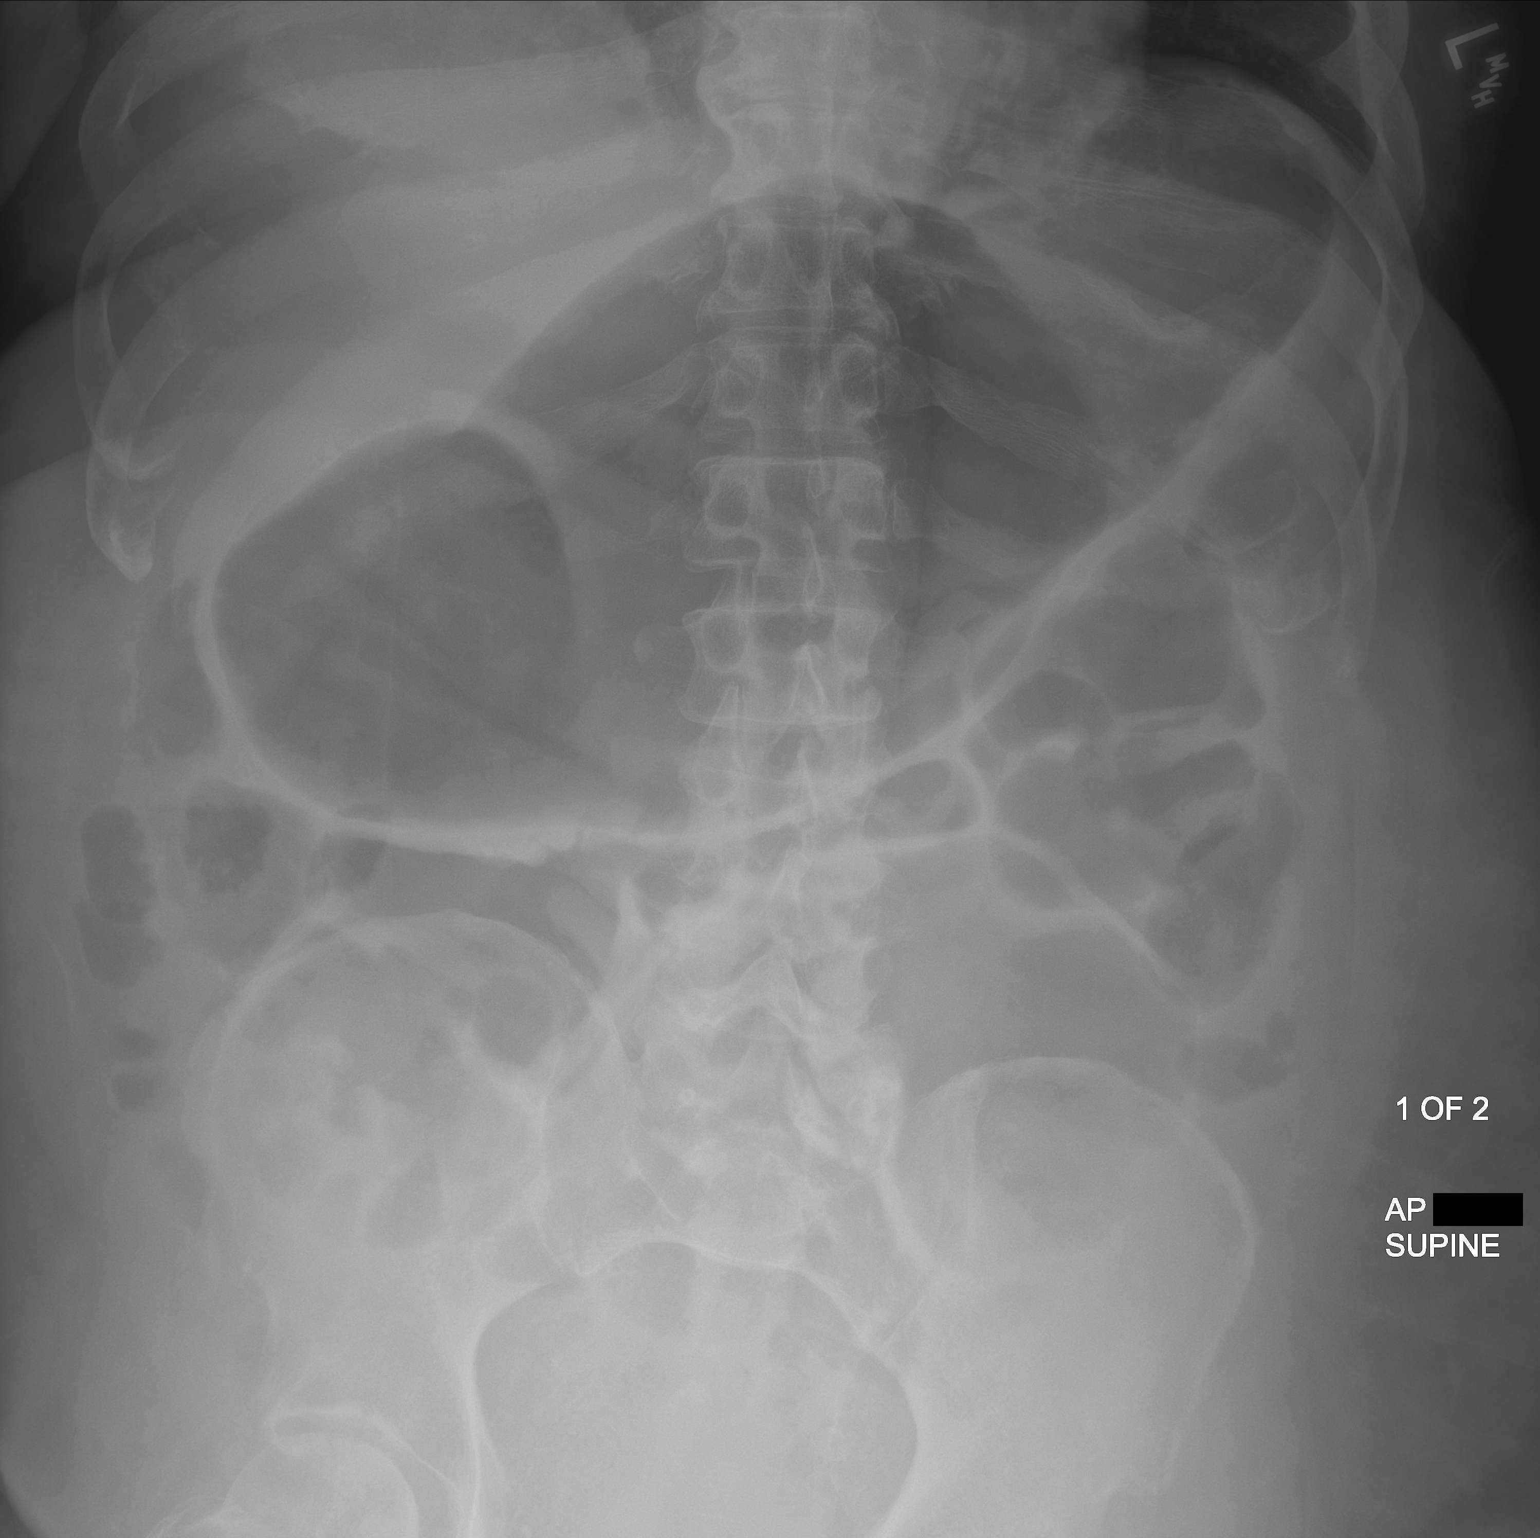

[abdomen kub (2 of 2)]
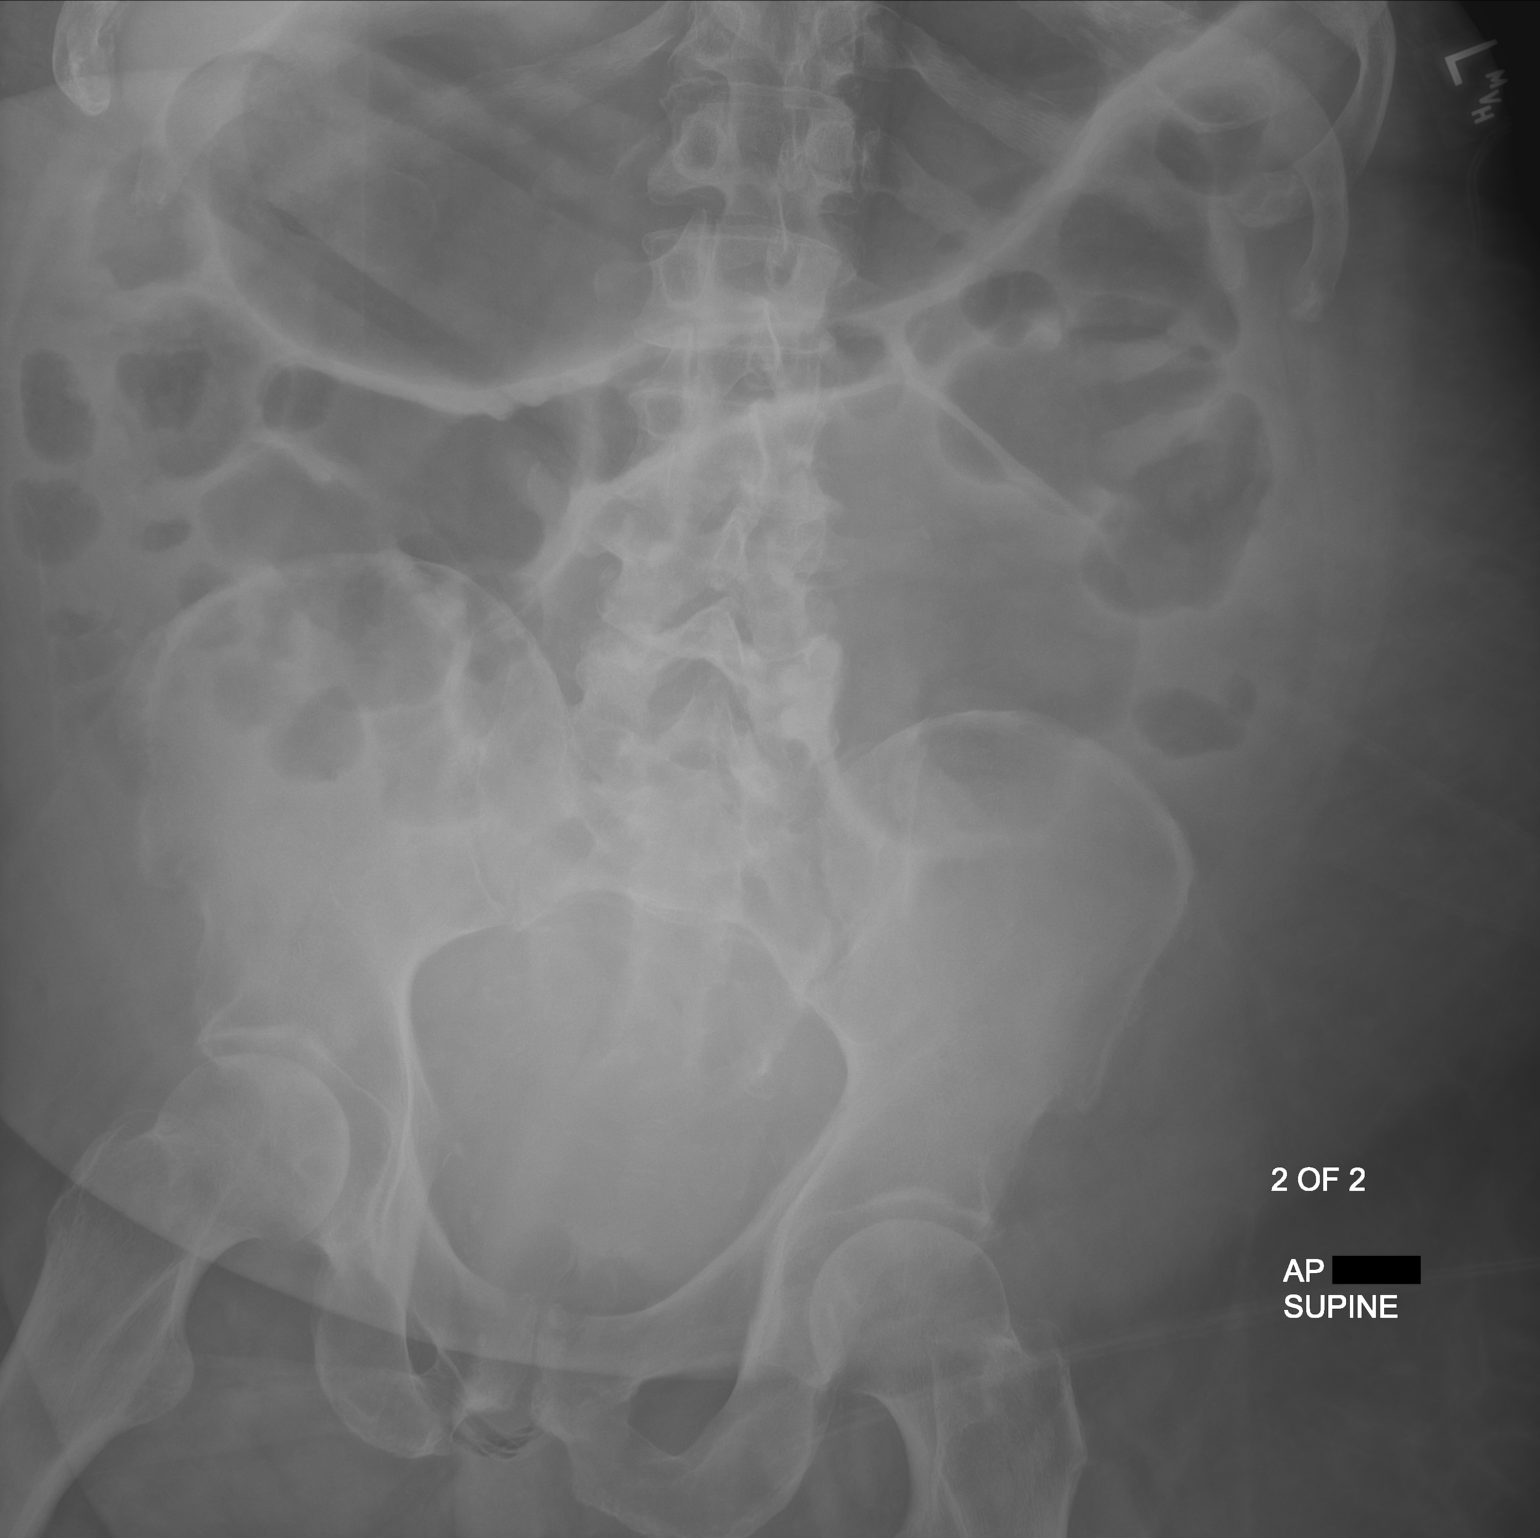

[2 of 2 positions shown; findings below may reference images not displayed]

FINDINGS: No small bowel dilatation is noted. No abnormal calcifications are
noted. There appears to be mildly decreased gaseous distention of
the colon.
IMPRESSION: Mildly decreased gaseous distention of the colon is noted suggesting
improving ileus.

## 2022-08-14 MED ORDER — FUROSEMIDE 10 MG/ML IJ SOLN
120.0000 mg | Freq: Once | INTRAVENOUS | Status: AC
  Administered 2022-08-14: 120 mg via INTRAVENOUS
  Filled 2022-08-14: qty 10

## 2022-08-14 NOTE — Progress Notes (Signed)
   08/14/22 1552  Spiritual Encounters  Type of Visit Initial  Care provided to: Patient  Conversation partners present during encounter Nurse (Update from RN-Catrina before the visit.)  Referral source APP (PMT NP-Mary)  Reason for visit Grief/loss (This chaplain understands Pt. brother-Timmy recently died of a heart attack. Timmy was the Pt. best friend after the Pt. stroke.)  Spiritual Framework  Presenting Themes Meaning/purpose/sources of inspiration;Significant life change (The Pt. attaches learning to "love" to the  relationship with his brother. The chaplain understands the Pt. questions his own ability to be the male leader of the family and make a decision about HD without his brother' companionship and wisdom.)  Community/Connection Family;Faith community (Pt. brother widow-Tawania, Pt. identifies relationship with [employer].)  Goals  Self/Personal Goals Pt. wants to attend his brother's funeral on Sunday.  Clinical Care Goals Starting of HD?  Interventions  Spiritual Care Interventions Made Established relationship of care and support;Compassionate presence;Reflective listening;Normalization of emotions;Bereavement/grief support;Prayer;Supported grief process  Intervention Outcomes  Outcomes Connection to spiritual care  Spiritual Care Plan  Spiritual Care Issues Still Outstanding Chaplain will continue to follow  Follow up plan  The Pt. accepted F/U spiritual care from [facility]. This chaplain left a VM requesting a pastoral visit from the Pt. church. Chaplain French Ana will navigate returned phone calls and the Pt. interest in attending the Pt. brother's funeral in person or virtually.   Chaplain Stephanie Acre [phone]

## 2022-08-14 NOTE — Progress Notes (Signed)
PROGRESS NOTE  [name] ZOX:[id number] DOB: 1960/09/12   PCP: [name], MD  Patient is from: SNF.  DOA: 08/08/2022 LOS: 6  Chief complaints Chief Complaint  Patient presents with   abnormal labs      Brief Narrative / Interim history:  62 y.o. male with a history of diabetes mellitus type 2, hypertension, CKD stage IV, PVD/left BKA, CVA, heart failure and pancytopenia.  Patient presented secondary to low hemoglobin.  He was transfused 1 unit with improvement in hemoglobin.  Hospitalization complicated by AKI on CKD-4 and acute on chronic CHF.  Nephrology consulted and started on Lasix drip.  AKI worse.  He did not seem to be a good candidate for hemodialysis due to his current functional status.  Per nephrology, he had previously declined dialysis, and hospice has been recommended.  Palliative care consulted and following.   Subjective: Seen and examined earlier this morning.  Remains lethargic and sleepy this morning.  He wakes to voice but struggles to remain awake for conversation.  Offered calling family member to discuss his care but he declined.  Objective: Vitals:   08/14/22 0215 08/14/22 0230 08/14/22 0446 08/14/22 1119  BP:   (!) 152/91 (!) 159/91  Pulse: 65 65 66 65  Resp: 15 15 15 12   Temp:   98.7 F (37.1 C)   TempSrc:   Oral   SpO2: 100% 100% 98%   Weight:   132.8 kg   Height:        Examination:  GENERAL: No apparent distress.  Sleepy. HEENT: MMM.  Vision and hearing grossly intact.  NECK: Supple.  No apparent JVD.  RESP:  No IWOB.  Fair aeration bilaterally. CVS:  RRR. Heart sounds normal.  ABD/GI/GU: BS+. Abd soft, NTND.  MSK/EXT: Left BKA.  Chronic venous insufficiency.  1-2 edema. SKIN: no apparent skin lesion or wound NEURO: Sleepy but wakes to voice.  Falls back asleep quickly.  Follows some commands. PSYCH: Calm.  No distress or agitation.  Procedures:  None  Microbiology summarized: None  Assessment and plan: Principal  Problem:   Pancytopenia (HCC) Active Problems:   Acute renal failure superimposed on stage 4 chronic kidney disease (HCC)   Acute on chronic heart failure with preserved ejection fraction (HFpEF) (HCC)   Essential hypertension   History of CVA (cerebrovascular accident)   T2DM (type 2 diabetes mellitus) (HCC)   PAD (peripheral artery disease) (HCC)   Depression  Pancytopenia: Looks like chronic issue.  Anemia panel consistent with anemia of chronic disease.  No overt bleeding.  LDH elevated. -Follow haptoglobin. -Could benefit from Epo but defer this to nephrology   AKI on CKD stage IV/azotemia: Somewhat lethargic concerning for uremia.  Not a great candidate for HD due to poor functional status. Recent Labs    07/18/22 0532 07/19/22 0513 07/20/22 0747 08/08/22 2028 08/09/22 0532 08/10/22 0031 08/11/22 0049 08/12/22 0121 08/13/22 0036 08/14/22 0129  BUN 63* 65* 61* 80* 74* 78* 90* 88* 97* 100*  CREATININE 3.85* 3.83* 3.74* 5.26* 5.09* 5.30* 5.31* 5.41* 5.58* 5.55*  -Nephrology following-diuresis with high-dose IV Lasix and intermittent metolazone -Palliative consulted as recommended by nephrology, and following. -Continue monitoring  Acute metabolic encephalopathy: Likely uremic encephalopathy.  Wakes to voice but not able to stay awake for conversation.  No focal neurodeficit.  Unfortunately, not a good candidate for hemodialysis. -Palliative medicine following.   Acute on chronic diastolic heart failure: Difficult to manage with worsening renal function. -Diuretics per nephrology.  Urinary retention:  Foley catheter placed on 6/17. -Continue Foley catheter and Flomax.   Primary hypertension: BP slightly elevated.  Likely due to fluid overload -Continue carvedilol, amlodipine, hydralazine   Diabetes mellitus type 2 -Continue sliding scale insulin   Peripheral artery disease/left BKA. -Continue Lipitor, aspirin   Depression -Continue sertraline   Goal of care:  DNR/DNI.  Difficult to engage patient for further goals of care discussion due to encephalopathy/uremia.. -Palliative medicine following.  Morbid obesity/moderate malnutrition Body mass index is 44.52 kg/m. Nutrition Problem: Moderate Malnutrition Etiology: chronic illness (HF, PAD) Signs/Symptoms: mild fat depletion, mild muscle depletion, edema Interventions: Ensure Enlive (each supplement provides 350kcal and 20 grams of protein), MVI   DVT prophylaxis:  SCDs Start: 08/08/22 2241  Code Status: DNR/DNI Family Communication: None at bedside. Level of care: Progressive Status is: Inpatient Remains inpatient appropriate because: Renal failure/acute on chronic CHF/pancytopenia   Consultants:  Nephrology Palliative medicine  55 minutes with more than 50% spent in reviewing records, counseling patient/family and coordinating care.   Sch Meds:  Scheduled Meds:  amLODipine  10 mg Oral Daily   aspirin  81 mg Oral Daily   atorvastatin  80 mg Oral QHS   carvedilol  12.5 mg Oral BID WC   Chlorhexidine Gluconate Cloth  6 each Topical Daily   feeding supplement  237 mL Oral BID BM   hydrALAZINE  50 mg Oral Q8H   insulin aspart  0-5 Units Subcutaneous QHS   insulin aspart  0-6 Units Subcutaneous TID WC   multivitamin with minerals  1 tablet Oral Daily   pantoprazole  40 mg Oral Daily   sertraline  50 mg Oral QODAY   sodium bicarbonate  1,950 mg Oral BID   sodium chloride flush  3 mL Intravenous Q12H   tamsulosin  0.4 mg Oral QPC supper   Continuous Infusions: PRN Meds:.acetaminophen **OR** acetaminophen, albuterol, ALPRAZolam, HYDROmorphone (DILAUDID) injection, mouth rinse, white petrolatum  Antimicrobials: Anti-infectives (From admission, onward)    None        I have personally reviewed the following labs and images: CBC: Recent Labs  Lab 08/08/22 2028 08/09/22 0532 08/10/22 0031 08/11/22 0049 08/13/22 0036 08/14/22 0129  WBC 1.9* 1.9* 1.3* 1.3* 1.6* 1.4*   NEUTROABS 1.0*  --   --   --  0.6*  --   HGB 6.9* 7.6* 6.8* 7.7* 7.4* 7.1*  HCT 22.1* 23.7* 21.3* 24.4* 23.0* 22.7*  MCV 90.2 88.8 88.4 86.8 88.8 87.3  PLT 57* 52* 45* 45* 48* 52*   BMP &GFR Recent Labs  Lab 08/09/22 0532 08/10/22 0031 08/11/22 0049 08/12/22 0121 08/13/22 0036 08/14/22 0129  NA 135 135 135 138 135 135  K 3.8 4.1 4.2 4.5 4.1 3.8  CL 103 103 102 102 99 101  CO2 19* 20* 21* 21* 20* 21*  GLUCOSE 123* 146* 116* 122* 143* 122*  BUN 74* 78* 90* 88* 97* 100*  CREATININE 5.09* 5.30* 5.31* 5.41* 5.58* 5.55*  CALCIUM 8.0* 7.6* 7.5* 7.6* 7.5* 7.5*  MG 1.8  --   --   --   --  2.0  PHOS 5.7*  --   --   --   --  5.5*   Estimated Creatinine Clearance: 18.6 mL/min (A) (by C-G formula based on SCr of 5.55 mg/dL (H)). Liver & Pancreas: Recent Labs  Lab 08/08/22 2028 08/14/22 0129  AST 28  --   ALT 27  --   ALKPHOS 73  --   BILITOT 0.6  --   PROT  7.7  --   ALBUMIN 2.8* 2.5*   No results for input(s): "LIPASE", "AMYLASE" in the last 168 hours. No results for input(s): "AMMONIA" in the last 168 hours. Diabetic: No results for input(s): "HGBA1C" in the last 72 hours. Recent Labs  Lab 08/13/22 1108 08/13/22 1633 08/13/22 2054 08/14/22 0607 08/14/22 1117  GLUCAP 119* 134* 331* 228* 191*   Cardiac Enzymes: No results for input(s): "CKTOTAL", "CKMB", "CKMBINDEX", "TROPONINI" in the last 168 hours. No results for input(s): "PROBNP" in the last 8760 hours. Coagulation Profile: No results for input(s): "INR", "PROTIME" in the last 168 hours. Thyroid Function Tests: No results for input(s): "TSH", "T4TOTAL", "FREET4", "T3FREE", "THYROIDAB" in the last 72 hours. Lipid Profile: No results for input(s): "CHOL", "HDL", "LDLCALC", "TRIG", "CHOLHDL", "LDLDIRECT" in the last 72 hours. Anemia Panel: No results for input(s): "VITAMINB12", "FOLATE", "FERRITIN", "TIBC", "IRON", "RETICCTPCT" in the last 72 hours. Urine analysis:    Component Value Date/Time   COLORURINE  YELLOW 08/09/2022 0530   APPEARANCEUR CLEAR 08/09/2022 0530   APPEARANCEUR Clear 12/31/2013 2116   LABSPEC 1.025 08/09/2022 0530   LABSPEC 1.032 12/31/2013 2116   PHURINE 5.5 08/09/2022 0530   GLUCOSEU NEGATIVE 08/09/2022 0530   GLUCOSEU >=500 12/31/2013 2116   HGBUR NEGATIVE 08/09/2022 0530   BILIRUBINUR NEGATIVE 08/09/2022 0530   BILIRUBINUR Negative 12/31/2013 2116   KETONESUR NEGATIVE 08/09/2022 0530   PROTEINUR 100 (A) 08/09/2022 0530   UROBILINOGEN 1.0 08/28/2013 1709   NITRITE NEGATIVE 08/09/2022 0530   LEUKOCYTESUR NEGATIVE 08/09/2022 0530   LEUKOCYTESUR Negative 12/31/2013 2116   Sepsis Labs: Invalid input(s): "PROCALCITONIN", "LACTICIDVEN"  Microbiology: No results found for this or any previous visit (from the past 240 hour(s)).  Radiology Studies: No results found.    Dewell Monnier T. Veneda Kirksey [facility]  If 7PM-7AM, please contact night-coverage [email or web address] 08/14/2022, 12:51 PM

## 2022-08-14 NOTE — Progress Notes (Signed)
Patient ID: [name], male   DOB: 02-Jun-1960, 62 y.o.   MRN: [id number] [facility] Progress Note   Assessment/ Plan:    1. Acute kidney Injury on chronic kidney disease stage IV: Appears to be hemodynamically mediated with decreased effective arterial blood volume in the setting of significant third spacing/diastolic heart failure.  This was likely exacerbated by his significant anemia.  Note history of inaccurate urine output charting due to collection challenges; he previously refused a foley but did have one placed as of 6/17 exam.  He got metolazone once on 6/17 - Redose Lasix 120 mg IV today.  UOP picked up yesterday - Strict ins/outs and daily weights   - Based on his current functional status, I am concerned that he would be a poor candidate for chronic/outpatient dialysis given his habitus, mobility challenges, and as well as functional status.  He had previously declined dialysis and hospice had been recommended on a prior admission should renal failure progress.  This admission he had indicated to the team that he would consider dialysis if needed however had adamantly refused even a foley for some time.  Note that dialysis is certainly more invasive than a foley.  I have recommended that the team consult palliative care to discuss his goals and in light of worsening renal function.  Appreciate their assistance  2. Urinary retention - post-void residual and/or straight cath for 582 and 800 ml in the past. He initially refused foley but this is now in place (as of 6/17) - continue foley catheter  - we started flomax on 6/17  3.  Pancytopenia: Without overt blood loss.  Anemia is in the setting of thrombocytopenia, status post PRBC transfusion.  32% iron sat.  Recent GI evaluation and they have recommended conservative management.  Team concerned for possible bone marrow failure.  Note 10/2020 CT a/p with contrast was without focal hepatic abnormality.   - he indicated on  Monday that he felt like he was taking too many medications when I discussed aranesp with him - he declined another medication at that time  4.  Acute exacerbation of chronic diastolic heart failure: Possibly exacerbated by his anemia.  Diuretics transiently held in the setting of worsening renal function and then were escalated for volume unloading based on physical exam.  Diuretics as above   5.  Hypertension: Blood pressures improved.  Optimize volume as above.   Disposition - continue inpatient monitoring.  See that team has consulted palliative care - appreciate their assistance.     Subjective:    Palliative care was called but was not able to do formal consult on the patient yet.  He had 3.1 liters of urine over 6/18.  He has been more difficult to engage which may be why they were not able to see him yesterday.  I called the primary team the attending reports that palliative saw the patient but he did not engage with them.  The patient is more conversant today for me - he shares that his brother died two days ago downstairs and that he is worried about who will take care of his 78 year old father.  The patient lives at a SNF and has lived there for more than a year (could not tell me how long), and stated that he was not able to walk, citing his leg amputation (which was in 02/2021 per charting).  I encouraged him to talk to palliative care today   Review of systems:  Breathing is "alright".  He has been on 1 liter oxygen   Denies chest pain  Denies nausea or vomiting     Objective:   BP (!) 152/91 (BP Location: Left Arm)   Pulse 66   Temp 98.7 F (37.1 C) (Oral)   Resp 15   Ht 5\' 8"  (1.727 m)   Wt 132.8 kg   SpO2 98%   BMI 44.52 kg/m   Intake/Output Summary (Last 24 hours) at 08/14/2022 7829 Last data filed at 08/14/2022 0450 Gross per 24 hour  Intake 660 ml  Output 1750 ml  Net -1090 ml   Weight change: 1.756 kg  Physical Exam:     General adult male in bed in no  acute distress HEENT normocephalic atraumatic extraocular movements intact sclera anicteric Neck supple trachea midline Lungs clear and reduced to auscultation bilaterally normal work of breathing at rest on 2 liters oxygen Heart S1S2  Abdomen soft nontender nondistended Extremities 2+ edema bilateral lower extremities, lymphedema; left BKA; RUE edema; no LUE edema Psych normal mood and affect; question his insight  Neuro - alert and oriented x 3. Follows commands.  GU - foley catheter in place  Imaging: No results found.  Labs: BMET Recent Labs  Lab 08/08/22 2028 08/09/22 0532 08/10/22 0031 08/11/22 0049 08/12/22 0121 08/13/22 0036 08/14/22 0129  NA 133* 135 135 135 138 135 135  K 3.8 3.8 4.1 4.2 4.5 4.1 3.8  CL 102 103 103 102 102 99 101  CO2 20* 19* 20* 21* 21* 20* 21*  GLUCOSE 131* 123* 146* 116* 122* 143* 122*  BUN 80* 74* 78* 90* 88* 97* 100*  CREATININE 5.26* 5.09* 5.30* 5.31* 5.41* 5.58* 5.55*  CALCIUM 7.8* 8.0* 7.6* 7.5* 7.6* 7.5* 7.5*  PHOS  --  5.7*  --   --   --   --  5.5*   CBC Recent Labs  Lab 08/08/22 2028 08/09/22 0532 08/10/22 0031 08/11/22 0049 08/13/22 0036 08/14/22 0129  WBC 1.9*   < > 1.3* 1.3* 1.6* 1.4*  NEUTROABS 1.0*  --   --   --  0.6*  --   HGB 6.9*   < > 6.8* 7.7* 7.4* 7.1*  HCT 22.1*   < > 21.3* 24.4* 23.0* 22.7*  MCV 90.2   < > 88.4 86.8 88.8 87.3  PLT 57*   < > 45* 45* 48* 52*   < > = values in this interval not displayed.    Medications:     amLODipine  10 mg Oral Daily   aspirin  81 mg Oral Daily   atorvastatin  80 mg Oral QHS   carvedilol  12.5 mg Oral BID WC   Chlorhexidine Gluconate Cloth  6 each Topical Daily   feeding supplement  237 mL Oral BID BM   hydrALAZINE  50 mg Oral Q8H   insulin aspart  0-5 Units Subcutaneous QHS   insulin aspart  0-6 Units Subcutaneous TID WC   multivitamin with minerals  1 tablet Oral Daily   pantoprazole  40 mg Oral Daily   sertraline  50 mg Oral QODAY   sodium bicarbonate  1,950 mg  Oral BID   sodium chloride flush  3 mL Intravenous Q12H   tamsulosin  0.4 mg Oral QPC supper   Estanislado Emms, MD 08/14/2022, 8:56 AM

## 2022-08-15 DIAGNOSIS — R627 Adult failure to thrive: Secondary | ICD-10-CM

## 2022-08-15 DIAGNOSIS — I5033 Acute on chronic diastolic (congestive) heart failure: Secondary | ICD-10-CM | POA: Diagnosis not present

## 2022-08-15 DIAGNOSIS — D61818 Other pancytopenia: Secondary | ICD-10-CM | POA: Diagnosis not present

## 2022-08-15 DIAGNOSIS — N179 Acute kidney failure, unspecified: Secondary | ICD-10-CM | POA: Diagnosis not present

## 2022-08-15 DIAGNOSIS — E1122 Type 2 diabetes mellitus with diabetic chronic kidney disease: Secondary | ICD-10-CM | POA: Diagnosis not present

## 2022-08-15 DIAGNOSIS — I739 Peripheral vascular disease, unspecified: Secondary | ICD-10-CM | POA: Diagnosis not present

## 2022-08-15 DIAGNOSIS — I10 Essential (primary) hypertension: Secondary | ICD-10-CM | POA: Diagnosis not present

## 2022-08-15 LAB — GLUCOSE, CAPILLARY
Glucose-Capillary: 135 mg/dL — ABNORMAL HIGH (ref 70–99)
Glucose-Capillary: 131 mg/dL — ABNORMAL HIGH (ref 70–99)
Glucose-Capillary: 140 mg/dL — ABNORMAL HIGH (ref 70–99)
Glucose-Capillary: 149 mg/dL — ABNORMAL HIGH (ref 70–99)
Glucose-Capillary: 120 mg/dL — ABNORMAL HIGH (ref 70–99)

## 2022-08-15 LAB — RENAL FUNCTION PANEL
Albumin: 2.5 g/dL — ABNORMAL LOW (ref 3.5–5.0)
Anion gap: 13 (ref 5–15)
BUN: 106 mg/dL — ABNORMAL HIGH (ref 8–23)
CO2: 21 mmol/L — ABNORMAL LOW (ref 22–32)
Calcium: 7.7 mg/dL — ABNORMAL LOW (ref 8.9–10.3)
Chloride: 101 mmol/L (ref 98–111)
Creatinine, Ser: 5.48 mg/dL — ABNORMAL HIGH (ref 0.61–1.24)
GFR, Estimated: 11 mL/min — ABNORMAL LOW (ref >60)
Glucose, Bld: 166 mg/dL — ABNORMAL HIGH (ref 70–99)
Phosphorus: 5.6 mg/dL — ABNORMAL HIGH (ref 2.5–4.6)
Potassium: 3.6 mmol/L (ref 3.5–5.1)
Sodium: 135 mmol/L (ref 135–145)

## 2022-08-15 LAB — CBC
HCT: 23.6 % — ABNORMAL LOW (ref 39.0–52.0)
Hemoglobin: 7.5 g/dL — ABNORMAL LOW (ref 13.0–17.0)
MCH: 28.1 pg (ref 26.0–34.0)
MCHC: 31.8 g/dL (ref 30.0–36.0)
MCV: 88.4 fL (ref 80.0–100.0)
Platelets: 58 10*3/uL — ABNORMAL LOW (ref 150–400)
RBC: 2.67 MIL/uL — ABNORMAL LOW (ref 4.22–5.81)
RDW: 17.2 % — ABNORMAL HIGH (ref 11.5–15.5)
WBC: 1.4 10*3/uL — CL (ref 4.0–10.5)
nRBC: 0 % (ref 0.0–0.2)

## 2022-08-15 LAB — HAPTOGLOBIN: Haptoglobin: 162 mg/dL (ref 32–363)

## 2022-08-15 LAB — MAGNESIUM: Magnesium: 2 mg/dL (ref 1.7–2.4)

## 2022-08-15 MED ORDER — DARBEPOETIN ALFA 40 MCG/0.4ML IJ SOSY
40.0000 ug | PREFILLED_SYRINGE | INTRAMUSCULAR | Status: DC
  Administered 2022-08-15: 40 ug via SUBCUTANEOUS
  Filled 2022-08-15: qty 0.4

## 2022-08-15 MED ORDER — ONDANSETRON HCL 4 MG/2ML IJ SOLN
4.0000 mg | Freq: Four times a day (QID) | INTRAMUSCULAR | Status: DC | PRN
  Administered 2022-08-15 – 2022-09-02 (×21): 4 mg via INTRAVENOUS
  Filled 2022-08-15 (×20): qty 2

## 2022-08-15 NOTE — Progress Notes (Signed)
[facility] [id number] [facility]) Hospital Liaison Note  This patient is currently enrolled in [facility] outpatient-based palliative care.  [facility] will continue to follow for discharge disposition.  Please call with any outpatient based palliative care or hospice related questions or concerns.  Thank you, Doreatha Martin, RN, BSN [facility] Liaison [phone]

## 2022-08-15 NOTE — Progress Notes (Signed)
PROGRESS NOTE  [name]:[id number] DOB: 04/18/1960   PCP: [name], MD  Patient is from: SNF.  DOA: 08/08/2022 LOS: 7  Chief complaints Chief Complaint  Patient presents with   abnormal labs      Brief Narrative / Interim history:  62 y.o. male with a history of diabetes mellitus type 2, hypertension, CKD stage IV, PVD/left BKA, CVA, heart failure and pancytopenia.  Patient presented secondary to low hemoglobin.  He was transfused 1 unit with improvement in hemoglobin.  Hospitalization complicated by AKI on CKD-4 and acute on chronic CHF.  Nephrology consulted and started on Lasix drip.  AKI worse.  He did not seem to be a good candidate for hemodialysis due to his current functional status.  Per nephrology, he had previously declined dialysis, and hospice has been recommended.  Nephrology and palliative medicine following.   Subjective: Seen and examined earlier this morning.  No major events overnight of this morning.  He likes to be "more awake" before talking about his care plan although he seems to be more alert today.  He seems to avoid engaging in conversation  Objective: Vitals:   08/14/22 2308 08/15/22 0043 08/15/22 0438 08/15/22 0926  BP: (!) 142/93 137/80 (!) 141/81 (!) 141/67  Pulse:  71 69 70  Resp:  16 16 13   Temp:  97.7 F (36.5 C) (!) 97.5 F (36.4 C) (!) 97.5 F (36.4 C)  TempSrc:  Oral Oral Oral  SpO2:  99% 100% 99%  Weight:   130.5 kg   Height:        Examination:  GENERAL: No apparent distress.  Sleepy. HEENT: MMM.  Vision and hearing grossly intact.  NECK: Supple.  No apparent JVD.  RESP:  No IWOB.  Fair aeration bilaterally. CVS:  RRR. Heart sounds normal.  ABD/GI/GU: BS+. Abd soft, NTND.  MSK/EXT: Left BKA.  Chronic venous insufficiency.  1-2 edema. SKIN: no apparent skin lesion or wound NEURO: Awake.  More alert today.  Fairly oriented.  Follows some commands. PSYCH: Somewhat flat affect.  Procedures:  None  Microbiology  summarized: None  Assessment and plan: Principal Problem:   Pancytopenia (HCC) Active Problems:   Acute renal failure superimposed on stage 4 chronic kidney disease (HCC)   Acute on chronic heart failure with preserved ejection fraction (HFpEF) (HCC)   Essential hypertension   History of CVA (cerebrovascular accident)   T2DM (type 2 diabetes mellitus) (HCC)   PAD (peripheral artery disease) (HCC)   Depression  Pancytopenia: Looks like chronic issue.  Anemia panel consistent with anemia of chronic disease.  No overt bleeding.  LDH elevated.  Haptoglobin within normal. -Could benefit from Epo but defer this to nephrology   AKI on CKD stage IV/azotemia: Per nephrology, not a great candidate for HD due to poor functional status.  Also refused dialysis in the past.  He seems to be undecided now Recent Labs    07/19/22 0513 07/20/22 0747 08/08/22 2028 08/09/22 0532 08/10/22 0031 08/11/22 0049 08/12/22 0121 08/13/22 0036 08/14/22 0129 08/15/22 0057  BUN 65* 61* 80* 74* 78* 90* 88* 97* 100* 106*  CREATININE 3.83* 3.74* 5.26* 5.09* 5.30* 5.31* 5.41* 5.58* 5.55* 5.48*  -Nephrology following-diuresis with high-dose IV Lasix and intermittent metolazone -Palliative medicine following. -Continue monitoring  Acute metabolic encephalopathy: Uremic encephalopathy?  He seems more alert today.  -Nephrology and palliative medicine following.   Acute on chronic diastolic heart failure: Difficult to manage with worsening renal function. -Diuretics per nephrology.  Urinary  retention: Foley catheter placed on 6/17. -Continue Foley catheter and Flomax.   Primary hypertension: BP slightly elevated.  Likely due to fluid overload -Continue carvedilol, amlodipine, hydralazine   Diabetes mellitus type 2 -Continue sliding scale insulin   Peripheral artery disease/left BKA. -Continue Lipitor, aspirin   Depression -Continue sertraline   Goal of care: DNR/DNI.  Difficult to engage patient for  goal of care discussion. -Palliative medicine following  Grief: Lost his brother 2 days prior to this hospitalization. -Appreciate emotional support by chaplain.   Morbid obesity/moderate malnutrition Body mass index is 43.74 kg/m. Nutrition Problem: Moderate Malnutrition Etiology: chronic illness (HF, PAD) Signs/Symptoms: mild fat depletion, mild muscle depletion, edema Interventions: Ensure Enlive (each supplement provides 350kcal and 20 grams of protein), MVI   DVT prophylaxis:  Place and maintain sequential compression device Start: 08/15/22 0726 SCDs Start: 08/08/22 2241  Code Status: DNR/DNI Family Communication: None at bedside. Level of care: Progressive Status is: Inpatient Remains inpatient appropriate because: Renal failure/acute on chronic CHF/pancytopenia   Consultants:  Nephrology Palliative medicine  35 minutes with more than 50% spent in reviewing records, counseling patient/family and coordinating care.   Sch Meds:  Scheduled Meds:  amLODipine  10 mg Oral Daily   aspirin  81 mg Oral Daily   atorvastatin  80 mg Oral QHS   carvedilol  12.5 mg Oral BID WC   Chlorhexidine Gluconate Cloth  6 each Topical Daily   feeding supplement  237 mL Oral BID BM   hydrALAZINE  50 mg Oral Q8H   insulin aspart  0-5 Units Subcutaneous QHS   insulin aspart  0-6 Units Subcutaneous TID WC   multivitamin with minerals  1 tablet Oral Daily   pantoprazole  40 mg Oral Daily   sertraline  50 mg Oral QODAY   sodium bicarbonate  1,950 mg Oral BID   sodium chloride flush  3 mL Intravenous Q12H   tamsulosin  0.4 mg Oral QPC supper   Continuous Infusions: PRN Meds:.acetaminophen **OR** acetaminophen, albuterol, ALPRAZolam, HYDROmorphone (DILAUDID) injection, mouth rinse, white petrolatum  Antimicrobials: Anti-infectives (From admission, onward)    None        I have personally reviewed the following labs and images: CBC: Recent Labs  Lab 08/08/22 2028 08/09/22 0532  08/10/22 0031 08/11/22 0049 08/13/22 0036 08/14/22 0129 08/15/22 0057  WBC 1.9*   < > 1.3* 1.3* 1.6* 1.4* 1.4*  NEUTROABS 1.0*  --   --   --  0.6*  --   --   HGB 6.9*   < > 6.8* 7.7* 7.4* 7.1* 7.5*  HCT 22.1*   < > 21.3* 24.4* 23.0* 22.7* 23.6*  MCV 90.2   < > 88.4 86.8 88.8 87.3 88.4  PLT 57*   < > 45* 45* 48* 52* 58*   < > = values in this interval not displayed.   BMP &GFR Recent Labs  Lab 08/09/22 0532 08/10/22 0031 08/11/22 0049 08/12/22 0121 08/13/22 0036 08/14/22 0129 08/15/22 0057  NA 135   < > 135 138 135 135 135  K 3.8   < > 4.2 4.5 4.1 3.8 3.6  CL 103   < > 102 102 99 101 101  CO2 19*   < > 21* 21* 20* 21* 21*  GLUCOSE 123*   < > 116* 122* 143* 122* 166*  BUN 74*   < > 90* 88* 97* 100* 106*  CREATININE 5.09*   < > 5.31* 5.41* 5.58* 5.55* 5.48*  CALCIUM 8.0*   < >  7.5* 7.6* 7.5* 7.5* 7.7*  MG 1.8  --   --   --   --  2.0 2.0  PHOS 5.7*  --   --   --   --  5.5* 5.6*   < > = values in this interval not displayed.   Estimated Creatinine Clearance: 18.7 mL/min (A) (by C-G formula based on SCr of 5.48 mg/dL (H)). Liver & Pancreas: Recent Labs  Lab 08/08/22 2028 08/14/22 0129 08/15/22 0057  AST 28  --   --   ALT 27  --   --   ALKPHOS 73  --   --   BILITOT 0.6  --   --   PROT 7.7  --   --   ALBUMIN 2.8* 2.5* 2.5*   No results for input(s): "LIPASE", "AMYLASE" in the last 168 hours. No results for input(s): "AMMONIA" in the last 168 hours. Diabetic: No results for input(s): "HGBA1C" in the last 72 hours. Recent Labs  Lab 08/14/22 1117 08/14/22 1609 08/14/22 2141 08/15/22 0649 08/15/22 0921  GLUCAP 191* 140* 178* 135* 131*   Cardiac Enzymes: No results for input(s): "CKTOTAL", "CKMB", "CKMBINDEX", "TROPONINI" in the last 168 hours. No results for input(s): "PROBNP" in the last 8760 hours. Coagulation Profile: No results for input(s): "INR", "PROTIME" in the last 168 hours. Thyroid Function Tests: No results for input(s): "TSH", "T4TOTAL", "FREET4",  "T3FREE", "THYROIDAB" in the last 72 hours. Lipid Profile: No results for input(s): "CHOL", "HDL", "LDLCALC", "TRIG", "CHOLHDL", "LDLDIRECT" in the last 72 hours. Anemia Panel: No results for input(s): "VITAMINB12", "FOLATE", "FERRITIN", "TIBC", "IRON", "RETICCTPCT" in the last 72 hours. Urine analysis:    Component Value Date/Time   COLORURINE YELLOW 08/09/2022 0530   APPEARANCEUR CLEAR 08/09/2022 0530   APPEARANCEUR Clear 12/31/2013 2116   LABSPEC 1.025 08/09/2022 0530   LABSPEC 1.032 12/31/2013 2116   PHURINE 5.5 08/09/2022 0530   GLUCOSEU NEGATIVE 08/09/2022 0530   GLUCOSEU >=500 12/31/2013 2116   HGBUR NEGATIVE 08/09/2022 0530   BILIRUBINUR NEGATIVE 08/09/2022 0530   BILIRUBINUR Negative 12/31/2013 2116   KETONESUR NEGATIVE 08/09/2022 0530   PROTEINUR 100 (A) 08/09/2022 0530   UROBILINOGEN 1.0 08/28/2013 1709   NITRITE NEGATIVE 08/09/2022 0530   LEUKOCYTESUR NEGATIVE 08/09/2022 0530   LEUKOCYTESUR Negative 12/31/2013 2116   Sepsis Labs: Invalid input(s): "PROCALCITONIN", "LACTICIDVEN"  Microbiology: No results found for this or any previous visit (from the past 240 hour(s)).  Radiology Studies: No results found.    Emad Brechtel T. Darria Corvera [facility]  If 7PM-7AM, please contact night-coverage [email or web address] 08/15/2022, 11:36 AM

## 2022-08-15 NOTE — Progress Notes (Signed)
Patient ID: [name], male   DOB: 09/28/1960, 62 y.o.   MRN: [id number]  [facility] Progress Note   Assessment/ Plan:    1. Acute kidney Injury on chronic kidney disease stage IV: Appears to be hemodynamically mediated with decreased effective arterial blood volume in the setting of significant third spacing/diastolic heart failure.  This was likely exacerbated by his significant anemia.  Note history of inaccurate urine output charting due to collection challenges; he previously refused a foley but did have one placed as of 6/17 exam.  He got metolazone once on 6/17 - Pause lasix on 08/15/22 - Reassess lasix daily   - Strict ins/outs and daily weights - Labs early AM on 6/21 - Will make him NPO after midnight tonight for potential access tomorrow  - If his renal function is not significantly improved with pausing the diuretics then would consult IR for tunneled catheter tomorrow.  - Based on his current functional status, I am concerned that he would be a poor candidate for chronic/outpatient dialysis given his habitus and mobility challenges.  He has been more conversant about his goals with me during the past couple of days.  He had previously declined dialysis and hospice had been recommended on a prior admission should renal failure progress; however, earlier this admission he had indicated to the team that he would consider dialysis if needed.  He had initially adamantly refused even a foley initially and stated that he felt like he was on too many medications.  His brother died just a couple of days ago and he is still processing this but it has made him change how he looks at things.  He understands that there are risks to dialysis but he states that "I want to try"   2. Urinary retention - post-void residual and/or straight cath for 582 and 800 ml in the past. He initially refused foley but this is now in place (as of 6/17) - continue foley catheter  - we started flomax on  6/17   3.  Pancytopenia: Without overt blood loss.  Anemia is in the setting of thrombocytopenia, status post PRBC transfusion.  32% iron sat.  Recent GI evaluation and they have recommended conservative management.  Team concerned for possible bone marrow failure.  Note 10/2020 CT a/p with contrast was without focal hepatic abnormality.   - aranesp 40 mcg once on 6/20 and weekly on Thursdays  4.  Acute exacerbation of chronic diastolic heart failure: Possibly exacerbated by his anemia.  Diuretics as above   5.  Hypertension: Blood pressures improved.  Optimize volume as above.   Disposition - continue inpatient monitoring.    Subjective:    He had 1.3 liters UOP over 6/19 charted.  He lives in a SNF and states that he is not able to walk, citing his leg amputation (which was in 02/2021 per charting).  He states that he goes outside in his wheelchair, plays bingo, does crafts at the SNF.  He is worried about his father - we discussed that with his limitations of living at a SNF that it would be difficult for him to help with his father.    We discussed risks/benefits/indications for dialysis and he does consent for dialysis if his renal function does not improve.    Review of systems:    Breathing is "alright".  He has been on 1 liter oxygen   Denies chest pain  Denies nausea or vomiting     Objective:  BP (!) 141/67 (BP Location: Left Wrist)   Pulse 70   Temp (!) 97.5 F (36.4 C) (Oral)   Resp 13   Ht 5\' 8"  (1.727 m)   Wt 130.5 kg   SpO2 99%   BMI 43.74 kg/m   Intake/Output Summary (Last 24 hours) at 08/15/2022 1206 Last data filed at 08/15/2022 0900 Gross per 24 hour  Intake 741.68 ml  Output 1275 ml  Net -533.32 ml   Weight change: -2.3 kg  Physical Exam:     General adult male in bed in no acute distress HEENT normocephalic atraumatic extraocular movements intact sclera anicteric Neck supple trachea midline Lungs clear and reduced to auscultation bilaterally normal  work of breathing at rest on 2 liters oxygen Heart S1S2  Abdomen soft nontender nondistended Extremities 2+ edema bilateral lower extremities, lymphedema; left BKA; RUE edema; no LUE edema Psych normal mood and affect; question his insight  Neuro - alert and oriented x 3. Follows commands.  GU - foley catheter in place  Imaging: No results found.  Labs: BMET Recent Labs  Lab 08/09/22 0532 08/10/22 0031 08/11/22 0049 08/12/22 0121 08/13/22 0036 08/14/22 0129 08/15/22 0057  NA 135 135 135 138 135 135 135  K 3.8 4.1 4.2 4.5 4.1 3.8 3.6  CL 103 103 102 102 99 101 101  CO2 19* 20* 21* 21* 20* 21* 21*  GLUCOSE 123* 146* 116* 122* 143* 122* 166*  BUN 74* 78* 90* 88* 97* 100* 106*  CREATININE 5.09* 5.30* 5.31* 5.41* 5.58* 5.55* 5.48*  CALCIUM 8.0* 7.6* 7.5* 7.6* 7.5* 7.5* 7.7*  PHOS 5.7*  --   --   --   --  5.5* 5.6*   CBC Recent Labs  Lab 08/08/22 2028 08/09/22 0532 08/11/22 0049 08/13/22 0036 08/14/22 0129 08/15/22 0057  WBC 1.9*   < > 1.3* 1.6* 1.4* 1.4*  NEUTROABS 1.0*  --   --  0.6*  --   --   HGB 6.9*   < > 7.7* 7.4* 7.1* 7.5*  HCT 22.1*   < > 24.4* 23.0* 22.7* 23.6*  MCV 90.2   < > 86.8 88.8 87.3 88.4  PLT 57*   < > 45* 48* 52* 58*   < > = values in this interval not displayed.    Medications:     amLODipine  10 mg Oral Daily   aspirin  81 mg Oral Daily   atorvastatin  80 mg Oral QHS   carvedilol  12.5 mg Oral BID WC   Chlorhexidine Gluconate Cloth  6 each Topical Daily   feeding supplement  237 mL Oral BID BM   hydrALAZINE  50 mg Oral Q8H   insulin aspart  0-5 Units Subcutaneous QHS   insulin aspart  0-6 Units Subcutaneous TID WC   multivitamin with minerals  1 tablet Oral Daily   pantoprazole  40 mg Oral Daily   sertraline  50 mg Oral QODAY   sodium bicarbonate  1,950 mg Oral BID   sodium chloride flush  3 mL Intravenous Q12H   tamsulosin  0.4 mg Oral QPC supper   Estanislado Emms, MD 08/15/2022, 12:41 PM

## 2022-08-15 NOTE — Progress Notes (Signed)
Patient ID: [name], male   DOB: 03-28-1960, 62 y.o.   MRN: [id number]    Progress Note from the Palliative Medicine Team at [facility]   Patient Name: [name]        Date: 08/15/2022 DOB: 1960/02/26  Age: 62 y.o. MRN#: [id number] Attending Physician: Almon Hercules, MD Primary Care Physician: [name], MD Admit Date: 08/08/2022    Extensive chart review has been completed prior to meeting with patient/family  including labs, vital signs, imaging, progress/consult notes, orders, medications and available advance directive documents.    This NP assessed patient at the bedside as a follow up for palliative medicine needs and emotional support.  62 y.o. male with a history of diabetes mellitus type 2, hypertension, CKD stage IV, PVD/left BKA, CVA, heart failure and pancytopenia.  Admitted with low hemoglobin.  Hospitalization complicated by AKI on C Kd stage IV and acute on chronic CHF.  Patient is faced with decisions regarding life prolonging measures specific to dialysis.     Difficult medical decision blanketed  by the grief of the death of  his brother a few days ago.  Brother died  here in the hospital.   Ongoing conversation today with [name] and his sister-in-law/[name] by telephone regarding current medical situation. Patient's multiple comorbidities were reviewed specific to end-stage renal disease, CHF, and pancytopenia, neurolysed weakness and overall failure to thrive.  Concept of human mortality and the limitations of medical interventions to prolong quality of life when the body fails to thrive.  Education offered on the difference between a full medical support path including dialysis and a comfort path allowing for natural death.  Education offered on hospice benefit; philosophy and eligibility.            After contemplation [name] agrees with plan outlined with Dr. Brooke Bonito.   Plan is for [name] to be n.p.o. after  midnight for potential access tomorrow.  If his renal function has not significantly improved with pausing the diuretics then consult IR for tunneled catheter placement.  With patient's permission I spoke to his sister-in-law Tawana by phone again later in the day and updated her, she understands and supports patient's decision.  Patient is high risk for decompensation regardless of medical interventions.               I added a second contact Dominique Hickman to contact list.  Please call Dondra Prader with questions through  the weekend as Linda Hedges is attending her husband's funeral services   Education offered today regarding  the importance of continued conversation with family and their  medical providers regarding overall plan of care and treatment options,  ensuring decisions are within the context of the patients values and GOCs.  Questions and concerns addressed   Discussed with Dr Alanda Slim and Dr Malen Gauze  PMT will continue to support holistically   Time: 90  minutes  Detailed review of medical records ( labs, imaging, vital signs), medically appropriate exam ( MS, skin, resp)   discussed with treatment team, counseling and education to patient, family, staff, documenting clinical information, medication management, coordination of care    Lorinda Creed NP  Palliative Medicine Team Team Phone # [phone] Pager [phone]

## 2022-08-16 ENCOUNTER — Inpatient Hospital Stay (HOSPITAL_COMMUNITY): Payer: Medicare (Managed Care)

## 2022-08-16 DIAGNOSIS — I5033 Acute on chronic diastolic (congestive) heart failure: Secondary | ICD-10-CM | POA: Diagnosis not present

## 2022-08-16 DIAGNOSIS — D61818 Other pancytopenia: Secondary | ICD-10-CM | POA: Diagnosis not present

## 2022-08-16 DIAGNOSIS — E1122 Type 2 diabetes mellitus with diabetic chronic kidney disease: Secondary | ICD-10-CM | POA: Diagnosis not present

## 2022-08-16 DIAGNOSIS — I10 Essential (primary) hypertension: Secondary | ICD-10-CM | POA: Diagnosis not present

## 2022-08-16 HISTORY — PX: IR US GUIDE VASC ACCESS RIGHT: IMG2390

## 2022-08-16 HISTORY — PX: IR FLUORO GUIDE CV LINE RIGHT: IMG2283

## 2022-08-16 LAB — RENAL FUNCTION PANEL
Albumin: 2.5 g/dL — ABNORMAL LOW (ref 3.5–5.0)
Albumin: 2.6 g/dL — ABNORMAL LOW (ref 3.5–5.0)
Anion gap: 12 (ref 5–15)
Anion gap: 14 (ref 5–15)
BUN: 104 mg/dL — ABNORMAL HIGH (ref 8–23)
BUN: 111 mg/dL — ABNORMAL HIGH (ref 8–23)
CO2: 21 mmol/L — ABNORMAL LOW (ref 22–32)
CO2: 20 mmol/L — ABNORMAL LOW (ref 22–32)
Calcium: 7.7 mg/dL — ABNORMAL LOW (ref 8.9–10.3)
Calcium: 7.8 mg/dL — ABNORMAL LOW (ref 8.9–10.3)
Chloride: 102 mmol/L (ref 98–111)
Chloride: 100 mmol/L (ref 98–111)
Creatinine, Ser: 5.61 mg/dL — ABNORMAL HIGH (ref 0.61–1.24)
Creatinine, Ser: 5.93 mg/dL — ABNORMAL HIGH (ref 0.61–1.24)
GFR, Estimated: 11 mL/min — ABNORMAL LOW (ref >60)
GFR, Estimated: 10 mL/min — ABNORMAL LOW (ref >60)
Glucose, Bld: 122 mg/dL — ABNORMAL HIGH (ref 70–99)
Glucose, Bld: 146 mg/dL — ABNORMAL HIGH (ref 70–99)
Phosphorus: 5.4 mg/dL — ABNORMAL HIGH (ref 2.5–4.6)
Phosphorus: 5.8 mg/dL — ABNORMAL HIGH (ref 2.5–4.6)
Potassium: 3.4 mmol/L — ABNORMAL LOW (ref 3.5–5.1)
Potassium: 3.4 mmol/L — ABNORMAL LOW (ref 3.5–5.1)
Sodium: 135 mmol/L (ref 135–145)
Sodium: 134 mmol/L — ABNORMAL LOW (ref 135–145)

## 2022-08-16 LAB — CBC
HCT: 22.7 % — ABNORMAL LOW (ref 39.0–52.0)
HCT: 22.6 % — ABNORMAL LOW (ref 39.0–52.0)
HCT: 22.3 % — ABNORMAL LOW (ref 39.0–52.0)
Hemoglobin: 7.2 g/dL — ABNORMAL LOW (ref 13.0–17.0)
Hemoglobin: 7.3 g/dL — ABNORMAL LOW (ref 13.0–17.0)
Hemoglobin: 7 g/dL — ABNORMAL LOW (ref 13.0–17.0)
MCH: 27.7 pg (ref 26.0–34.0)
MCH: 28.5 pg (ref 26.0–34.0)
MCH: 27.9 pg (ref 26.0–34.0)
MCHC: 31.7 g/dL (ref 30.0–36.0)
MCHC: 32.3 g/dL (ref 30.0–36.0)
MCHC: 31.4 g/dL (ref 30.0–36.0)
MCV: 87.3 fL (ref 80.0–100.0)
MCV: 88.3 fL (ref 80.0–100.0)
MCV: 88.8 fL (ref 80.0–100.0)
Platelets: 59 10*3/uL — ABNORMAL LOW (ref 150–400)
Platelets: 61 10*3/uL — ABNORMAL LOW (ref 150–400)
Platelets: 58 10*3/uL — ABNORMAL LOW (ref 150–400)
RBC: 2.6 MIL/uL — ABNORMAL LOW (ref 4.22–5.81)
RBC: 2.56 MIL/uL — ABNORMAL LOW (ref 4.22–5.81)
RBC: 2.51 MIL/uL — ABNORMAL LOW (ref 4.22–5.81)
RDW: 17.2 % — ABNORMAL HIGH (ref 11.5–15.5)
RDW: 17.2 % — ABNORMAL HIGH (ref 11.5–15.5)
RDW: 17.2 % — ABNORMAL HIGH (ref 11.5–15.5)
WBC: 1.3 10*3/uL — CL (ref 4.0–10.5)
WBC: 1.2 10*3/uL — CL (ref 4.0–10.5)
WBC: 1.3 10*3/uL — CL (ref 4.0–10.5)
nRBC: 0 % (ref 0.0–0.2)
nRBC: 0 % (ref 0.0–0.2)
nRBC: 0 % (ref 0.0–0.2)

## 2022-08-16 LAB — GLUCOSE, CAPILLARY
Glucose-Capillary: 97 mg/dL (ref 70–99)
Glucose-Capillary: 128 mg/dL — ABNORMAL HIGH (ref 70–99)
Glucose-Capillary: 156 mg/dL — ABNORMAL HIGH (ref 70–99)
Glucose-Capillary: 140 mg/dL — ABNORMAL HIGH (ref 70–99)

## 2022-08-16 LAB — HEPATITIS B SURFACE ANTIGEN: Hepatitis B Surface Ag: NONREACTIVE

## 2022-08-16 IMAGING — DX DG ABD PORTABLE 1V
2 series · 2 of 2 positions shown · non-contrast
Comparison: 12/04/2020

CLINICAL DATA: Hamposhan syndrome

EXAM:
PORTABLE ABDOMEN - 1 VIEW

[abdomen kub (1 of 2)]
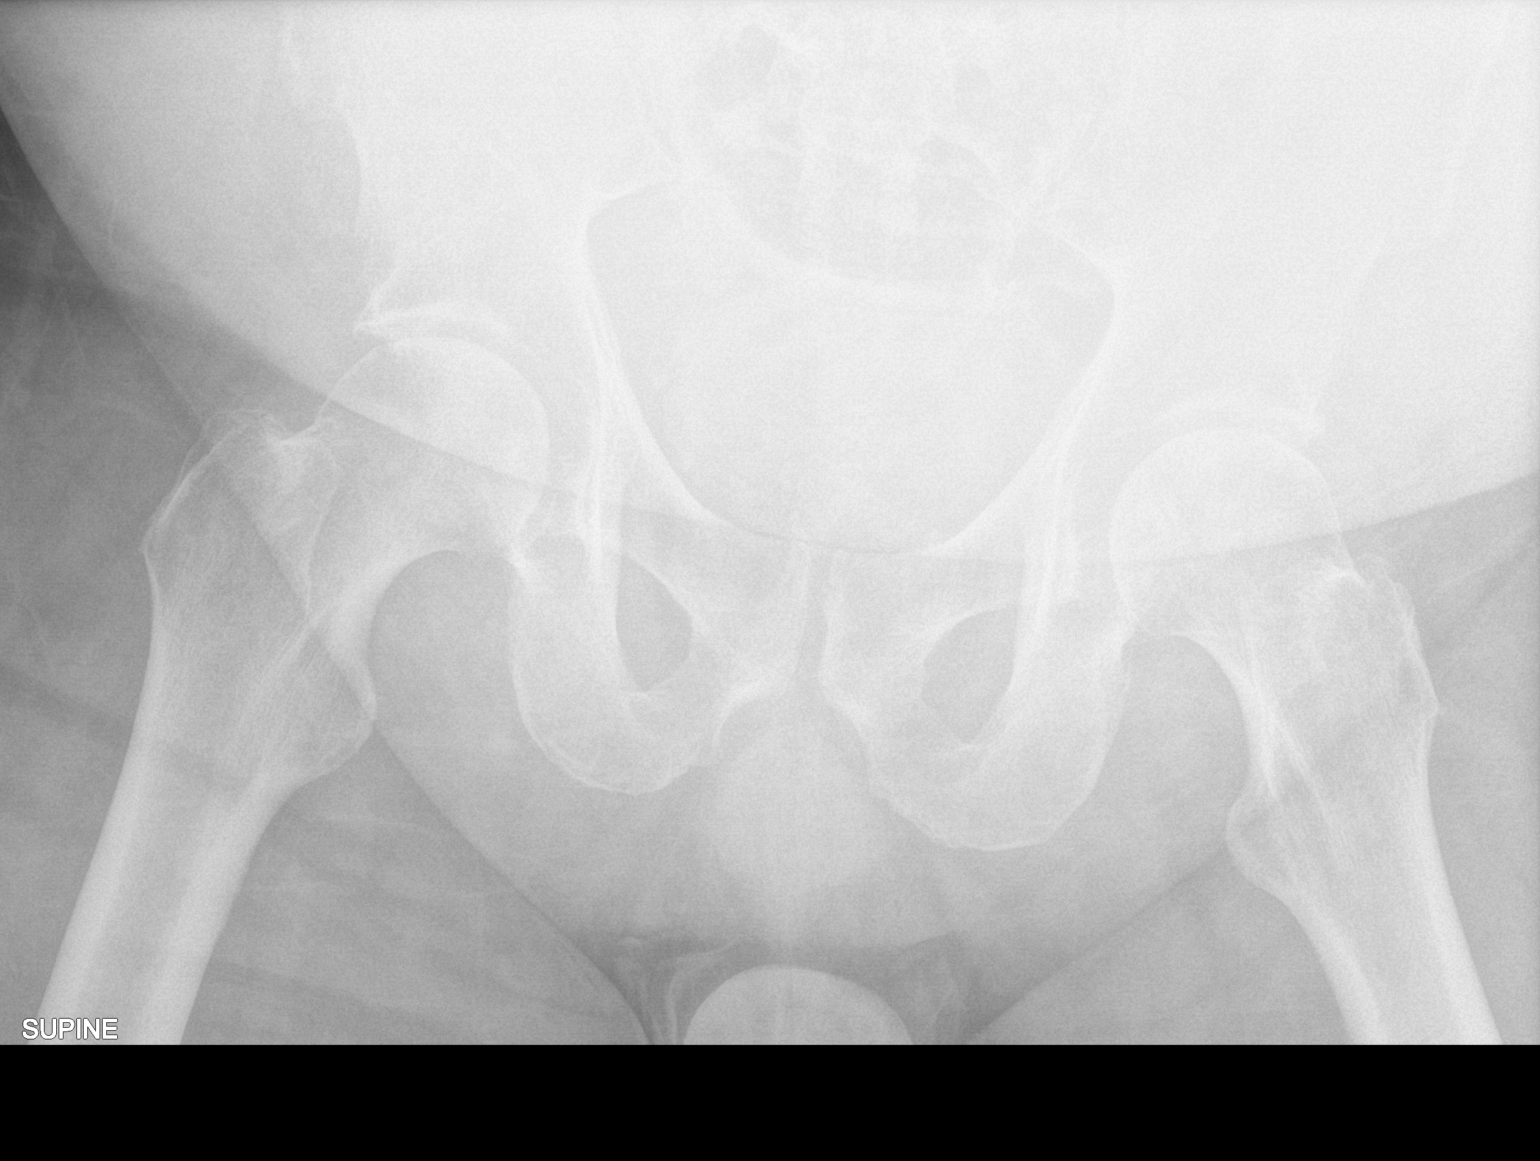

[abdomen kub (2 of 2)]
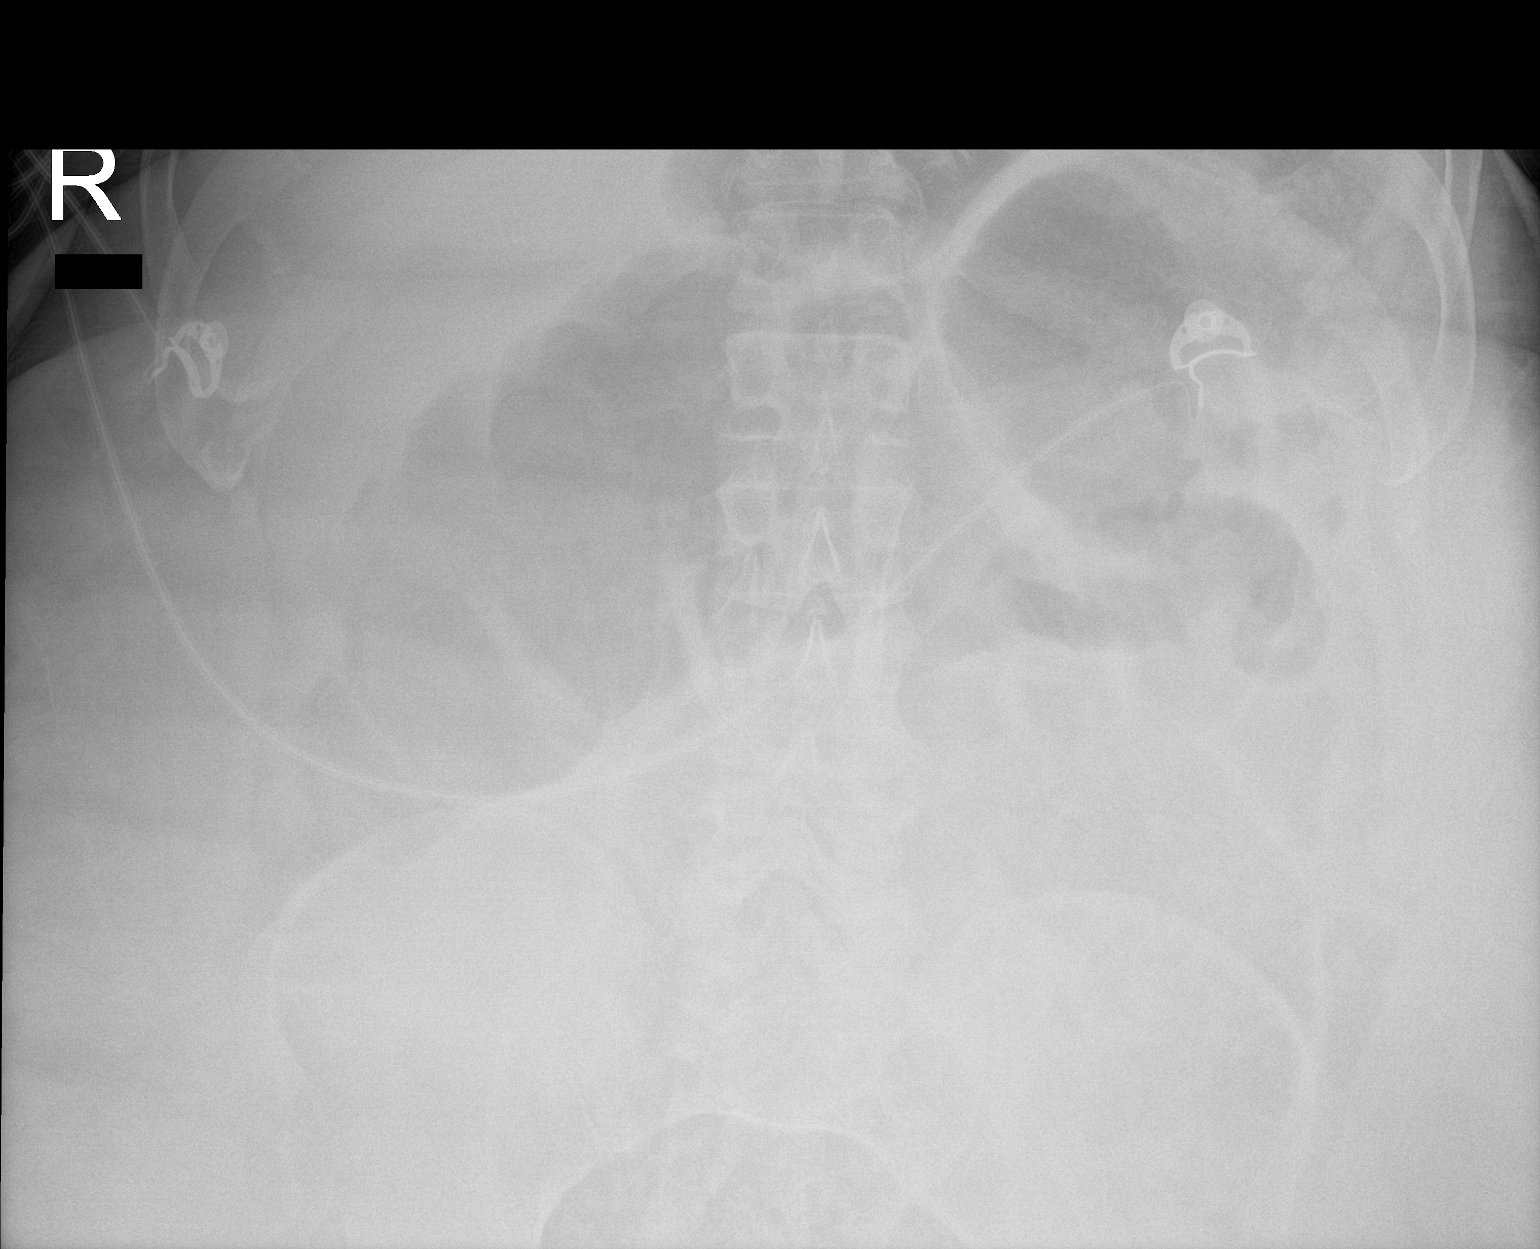

[2 of 2 positions shown; findings below may reference images not displayed]

FINDINGS: Two supine frontal views of the abdomen and pelvis demonstrate
persistent gaseous distension of the transverse colon measuring up
to 11.6 cm in diameter, previously having measured 15.5 cm. No
abdominal masses or abnormal calcifications. No acute bony
abnormalities.
IMPRESSION: 1. Persistent but decreased gaseous distension of the transverse
colon.

## 2022-08-16 MED ORDER — LORAZEPAM 2 MG/ML IJ SOLN: INTRAMUSCULAR | Status: AC | PRN

## 2022-08-16 MED ORDER — ONDANSETRON HCL 4 MG/2ML IJ SOLN
INTRAMUSCULAR | Status: AC
  Filled 2022-08-16: qty 2

## 2022-08-16 MED ORDER — CEFAZOLIN SODIUM-DEXTROSE 2-4 GM/100ML-% IV SOLN
INTRAVENOUS | Status: AC | PRN
  Administered 2022-08-16: 2 g via INTRAVENOUS

## 2022-08-16 MED ORDER — CHLORHEXIDINE GLUCONATE CLOTH 2 % EX PADS
6.0000 | MEDICATED_PAD | Freq: Every day | CUTANEOUS | Status: DC
  Administered 2022-08-16: 6 via TOPICAL

## 2022-08-16 MED ORDER — NEPRO/CARBSTEADY PO LIQD
237.0000 mL | Freq: Two times a day (BID) | ORAL | Status: DC
  Administered 2022-08-18 – 2022-08-19 (×3): 237 mL via ORAL

## 2022-08-16 MED ORDER — FENTANYL CITRATE (PF) 100 MCG/2ML IJ SOLN
INTRAMUSCULAR | Status: AC | PRN
  Administered 2022-08-16: 50 ug via INTRAVENOUS

## 2022-08-16 MED ORDER — HEPARIN SODIUM (PORCINE) 1000 UNIT/ML IJ SOLN
INTRAMUSCULAR | Status: AC
  Filled 2022-08-16: qty 10

## 2022-08-16 MED ORDER — LIDOCAINE HCL (PF) 1 % IJ SOLN: 5.0000 mL | INTRAMUSCULAR | Status: DC | PRN

## 2022-08-16 MED ORDER — LIDOCAINE-PRILOCAINE 2.5-2.5 % EX CREA: 1.0000 | TOPICAL_CREAM | CUTANEOUS | Status: DC | PRN

## 2022-08-16 MED ORDER — LIDOCAINE-EPINEPHRINE 1 %-1:100000 IJ SOLN
INTRAMUSCULAR | Status: AC
  Filled 2022-08-16: qty 1

## 2022-08-16 MED ORDER — LIDOCAINE-EPINEPHRINE 1 %-1:100000 IJ SOLN
20.0000 mL | Freq: Once | INTRAMUSCULAR | Status: AC
  Administered 2022-08-16: 8 mL via INTRADERMAL

## 2022-08-16 MED ORDER — CEFAZOLIN SODIUM-DEXTROSE 2-4 GM/100ML-% IV SOLN
INTRAVENOUS | Status: AC
  Filled 2022-08-16: qty 100

## 2022-08-16 MED ORDER — PENTAFLUOROPROP-TETRAFLUOROETH EX AERO: 1.0000 | INHALATION_SPRAY | CUTANEOUS | Status: DC | PRN

## 2022-08-16 MED ORDER — HEPARIN SODIUM (PORCINE) 1000 UNIT/ML DIALYSIS
1000.0000 [IU] | INTRAMUSCULAR | Status: DC | PRN
  Filled 2022-08-16: qty 1

## 2022-08-16 MED ORDER — MIDAZOLAM HCL 2 MG/2ML IJ SOLN
INTRAMUSCULAR | Status: AC | PRN
  Administered 2022-08-16: 1 mg via INTRAVENOUS
  Administered 2022-08-16: .5 mg via INTRAVENOUS

## 2022-08-16 MED ORDER — ALTEPLASE 2 MG IJ SOLR: 2.0000 mg | Freq: Once | INTRAMUSCULAR | Status: DC | PRN

## 2022-08-16 MED ORDER — ORAL CARE MOUTH RINSE
15.0000 mL | OROMUCOSAL | Status: DC
  Administered 2022-08-16 – 2022-09-03 (×29): 15 mL via OROMUCOSAL

## 2022-08-16 MED ORDER — RENA-VITE PO TABS
1.0000 | ORAL_TABLET | Freq: Every day | ORAL | Status: DC
  Administered 2022-08-16 – 2022-09-02 (×17): 1 via ORAL
  Filled 2022-08-16 (×17): qty 1

## 2022-08-16 MED ORDER — MIDAZOLAM HCL 2 MG/2ML IJ SOLN
INTRAMUSCULAR | Status: AC
  Filled 2022-08-16: qty 2

## 2022-08-16 MED ORDER — FENTANYL CITRATE (PF) 100 MCG/2ML IJ SOLN
INTRAMUSCULAR | Status: AC
  Filled 2022-08-16: qty 2

## 2022-08-16 MED ORDER — ANTICOAGULANT SODIUM CITRATE 4% (200MG/5ML) IV SOLN: 5.0000 mL | Status: DC | PRN

## 2022-08-16 MED ORDER — ORAL CARE MOUTH RINSE: 15.0000 mL | OROMUCOSAL | Status: DC | PRN

## 2022-08-16 NOTE — Care Management Important Message (Signed)
Important Message  Patient Details  Name: [name] MRN: [id number] Date of Birth: [birth year]   Medicare Important Message Given:  Yes     Renie Ora 08/16/2022, 8:21 AM

## 2022-08-16 NOTE — Procedures (Signed)
Pre-procedure Diagnosis: ESRD Post-procedure Diagnosis: Same  Successful placement of tunneled HD catheter with tips terminating within the superior aspect of the right atrium.    Complications: None Immediate  EBL: Minimal   The catheter is ready for immediate use.   Jay Natalia Wittmeyer, MD Pager #: [phone]   

## 2022-08-16 NOTE — Progress Notes (Addendum)
[facility] NEPHROLOGY PROGRESS NOTE  Assessment/ Plan:  # 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  Patient has multiple consults with palliative care and discussion about goals of care.  It seems like it was decided to pursue dialysis as per patient interest and monitor.  Based on the current functional status, deconditioning and challenges with mobility I am worried that he may not be a good candidate for outpatient dialysis.  He is nonoliguric but certainly no sign of renal recovery.  He is n.p.o. postmidnight.  I discussed with him and he agrees to start dialysis and catheter placement.  IR consulted for tunneled HD catheter.  First HD today.  I will contact social worker to arrange outpatient HD.  We need to make sure that he can tolerate HD on chair before discharge.  # Acute urinary retention: On Flomax and Foley catheter.  # Pancytopenia/anemia: I recommend hematology/oncology consult for further evaluation.  Iron saturation 32% with high ferritin.  I will start erythropoietin.   # Acute on chronic diastolic CHF: Treated with IV diuretics.  Now managing volume with HD.  # HTN/volume: Continue current antihypertensives, managing volume with HD.  # Metabolic acidosis: Managed with dialysis, discontinue sodium bicarbonate.  Subjective: Seen and examined at bedside.  Patient reports tired and not feeling well.  No chest pain or shortness of breath.  He agrees to start dialysis.  Urine output is recorded 1.4 L. Objective Vital signs in last 24 hours: Vitals:   08/16/22 0003 08/16/22 0211 08/16/22 0429 08/16/22 0441  BP: (!) 171/93 (!) 160/87 (!) 145/81   Pulse: 73  73   Resp: 15  14   Temp: 98.1 F (36.7 C)  98.7 F (37.1 C)   TempSrc: Oral  Oral   SpO2: 91%  95%   Weight:    125.6 kg  Height:       Weight change: -4.9 kg  Intake/Output Summary (Last 24 hours) at 08/16/2022 6578 Last data filed at 08/16/2022 4696 Gross  per 24 hour  Intake 803 ml  Output 1450 ml  Net -647 ml       Labs: RENAL PANEL Recent Labs  Lab 08/12/22 0121 08/13/22 0036 08/14/22 0129 08/15/22 0057 08/16/22 0102  NA 138 135 135 135 135  K 4.5 4.1 3.8 3.6 3.4*  CL 102 99 101 101 102  CO2 21* 20* 21* 21* 21*  GLUCOSE 122* 143* 122* 166* 122*  BUN 88* 97* 100* 106* 104*  CREATININE 5.41* 5.58* 5.55* 5.48* 5.61*  CALCIUM 7.6* 7.5* 7.5* 7.7* 7.7*  MG  --   --  2.0 2.0  --   PHOS  --   --  5.5* 5.6* 5.4*  ALBUMIN  --   --  2.5* 2.5* 2.5*    Liver Function Tests: Recent Labs  Lab 08/14/22 0129 08/15/22 0057 08/16/22 0102  ALBUMIN 2.5* 2.5* 2.5*   No results for input(s): "LIPASE", "AMYLASE" in the last 168 hours. No results for input(s): "AMMONIA" in the last 168 hours. CBC: Recent Labs    10/20/21 0449 10/21/21 0234 03/19/22 0754 03/20/22 1116 08/08/22 2028 08/09/22 0532 08/11/22 0049 08/13/22 0036 08/14/22 0129 08/15/22 0057 08/16/22 0102  HGB  --    < > 7.5*   < > 6.9*   < > 7.7* 7.4* 7.1* 7.5* 7.2*  MCV  --    < > 83.8   < > 90.2   < > 86.8 88.8  87.3 88.4 87.3  VITAMINB12 439  --  321  --  1,139*  --   --   --   --   --   --   FOLATE 6.9  --  9.3  --  9.1  --   --   --   --   --   --   FERRITIN 164  --  235  --  628*  --   --   --   --   --   --   TIBC 227*  --  220*  --  228*  --   --   --   --   --   --   IRON 12*  --  26*  --  72  --   --   --   --   --   --   RETICCTPCT 2.6  --  1.5  --  1.0  --   --   --   --   --   --    < > = values in this interval not displayed.    Cardiac Enzymes: No results for input(s): "CKTOTAL", "CKMB", "CKMBINDEX", "TROPONINI" in the last 168 hours. CBG: Recent Labs  Lab 08/15/22 0921 08/15/22 1135 08/15/22 1623 08/15/22 2047 08/16/22 0618  GLUCAP 131* 140* 149* 120* 97    Iron Studies: No results for input(s): "IRON", "TIBC", "TRANSFERRIN", "FERRITIN" in the last 72 hours. Studies/Results: No results found.  Medications: Infusions:   Scheduled  Medications:  amLODipine  10 mg Oral Daily   aspirin  81 mg Oral Daily   atorvastatin  80 mg Oral QHS   carvedilol  12.5 mg Oral BID WC   Chlorhexidine Gluconate Cloth  6 each Topical Daily   Chlorhexidine Gluconate Cloth  6 each Topical Q0600   darbepoetin (ARANESP) injection - NON-DIALYSIS  40 mcg Subcutaneous Q Thu-1800   feeding supplement  237 mL Oral BID BM   hydrALAZINE  50 mg Oral Q8H   insulin aspart  0-5 Units Subcutaneous QHS   insulin aspart  0-6 Units Subcutaneous TID WC   multivitamin with minerals  1 tablet Oral Daily   pantoprazole  40 mg Oral Daily   sertraline  50 mg Oral QODAY   sodium bicarbonate  1,950 mg Oral BID   sodium chloride flush  3 mL Intravenous Q12H   tamsulosin  0.4 mg Oral QPC supper    have reviewed scheduled and prn medications.  Physical Exam: General:NAD, comfortable Heart:RRR, s1s2 nl Lungs: Basal decreased breath sound.   Abdomen:soft, Non-tender, non-distended Extremities: Bilateral lower extremities has chronic edema and skin changes Dialysis Access: Not yet  Alverto Shedd Jaynie Collins 08/16/2022,8:22 AM  LOS: 8 days

## 2022-08-16 NOTE — Progress Notes (Signed)
Requested to see pt for out-pt HD needs at d/c. Pt is from a snf ([name], [place]). Contacted CSW to request that snf be contacted to see which clinic snf will transport pt to and if there will need to be a particular schedule. Per CSW, snf is requesting [facility] [place] on MWF around 8:30. Referral submitted to [facility] admissions for review. Will meet with pt once out-pt HD details are known to make pt aware of arrangements as well as provide details to CSW to provide to snf. Will assist as needed.   Olivia Canter Renal Navigator [phone]

## 2022-08-16 NOTE — Progress Notes (Signed)
Pt received a sandwich and a cup of juice.

## 2022-08-16 NOTE — Progress Notes (Signed)
Nutrition Follow-up  DOCUMENTATION CODES:   Obesity unspecified, Non-severe (moderate) malnutrition in context of chronic illness  INTERVENTION:  Liberalize diet to regular d/t very limited PO intake, can consider necessary dietary restrictions if/when PO intake begins to improve Change Ensure to Nepro Shakes BID for added caloric and protein intake Change standard MVI to Renal MVI Consider Cortrak placement given ongoing inadequate PO intake if aligns with GOC  NUTRITION DIAGNOSIS:   Moderate Malnutrition related to chronic illness (HF, PAD) as evidenced by mild fat depletion, mild muscle depletion, edema. - remains applicable  GOAL:   Patient will meet greater than or equal to 90% of their needs - goal unmet, addressing via meals and nutrition supplements.   MONITOR:   PO intake, Supplement acceptance, Labs, Weight trends, I & O's  REASON FOR ASSESSMENT:   Malnutrition Screening Tool    ASSESSMENT:   Pt admitted from SNF d/t low hemoglobin. PMH significant for HTN, insulin-dependent DM, CVA, PAD, chronic HFpEF. Recently admitted for acute on chronic HF, acute hypoxic respiratory failure and new pancytopenia with positive FOBT.  6/21 - s/p [facility] placement   Palliative Care following for ongoing GOC discussions.   TDC placed and plans for first HD treatment today. Pt off unit. Per RN continues with variable PO intake but overall very minimal. Able to self feed. RN plans to monitor adequacy of PO intake today. Pt receiving most Ensure supplements but unclear how much is being consumed.   Meal completions: 6/14: 50% lunch 6/15: 0% breakfast, 100% lunch 6/16: 75% breakfast, 10% dinner 6/17: 0% breakfast 6/20: 30% breakfast  Admit weight: 124.6 kg Current weight: 125.6 kg Dry weight unknown  Edema: mild pitting generalized, non-pitting RUE, mild pitting LUE, moderate pitting BLE  Medications: SSI 0-5 at bedtime, SSI 0-6 units TID, MVI, protonix   Labs: potassium 3.4,  BUN 104, Cr 5.61, phos 5.4, GFR 11, CBG's 97-156 x24 hours  UOP: x12 hours I/O's: - since admit  Diet Order:   Diet Order             Diet regular Room service appropriate? Yes with Assist; Fluid consistency: Thin; Fluid restriction: 1200 mL Fluid  Diet effective now                   EDUCATION NEEDS:   Education needs have been addressed  Skin:  Skin Assessment: Reviewed RN Assessment  Last BM:  6/20 (type 7 x2)  Height:   Ht Readings from Last 1 Encounters:  08/09/22 5\' 8"  (1.727 m)    Weight:   Wt Readings from Last 1 Encounters:  08/16/22 125.6 kg    Ideal Body Weight:  70 kg  BMI:  Body mass index is 42.1 kg/m.  Estimated Nutritional Needs:   Kcal:  1800-2000  Protein:  95-110g  Fluid:  >/=1.8L  Drusilla Kanner, RDN, LDN Clinical Nutrition

## 2022-08-16 NOTE — Progress Notes (Signed)
PROGRESS NOTE  [name] ZOX:[id number] DOB: 10/08/1960   PCP: [name], MD  Patient is from: SNF.  DOA: 08/08/2022 LOS: 8  Chief complaints Chief Complaint  Patient presents with   abnormal labs      Brief Narrative / Interim history:  62 y.o. male with a history of diabetes mellitus type 2, hypertension, CKD stage IV, PVD/left BKA, CVA, heart failure and pancytopenia.  Patient presented secondary to low hemoglobin.  He was transfused 1 unit with improvement in hemoglobin.  Hospitalization complicated by AKI on CKD-4 and acute on chronic CHF.  Nephrology consulted and started on Lasix drip.  AKI worse.  Initially felt to be not a good candidate for hemodialysis based on his functional status and refusal to HD.  Palliative consultation was recommended by nephrology.  However, he later changed his mind and agreed to have dialysis.  Nephrology requested [facility] to initiate dialysis.  Palliative medicine following as well.   Subjective: Seen and examined earlier this morning.  No major events overnight of this morning.  He is more awake and alert today.  Understand the plan for [facility] and initiation of dialysis by nephrology.  Feels tired but denies pain or shortness of breath.  Objective: Vitals:   08/16/22 1200 08/16/22 1205 08/16/22 1210 08/16/22 1232  BP: (!) 157/75 (!) 160/83 (!) 157/76 (!) 162/86  Pulse: 73 74 76 74  Resp: 13 13 17 18   Temp:      TempSrc:      SpO2: 96% 95% 95% 95%  Weight:      Height:        Examination:  GENERAL: No apparent distress.  Sleepy. HEENT: MMM.  Vision and hearing grossly intact.  NECK: Supple.  No apparent JVD.  RESP:  No IWOB.  Fair aeration bilaterally. CVS:  RRR. Heart sounds normal.  ABD/GI/GU: BS+. Abd soft, NTND.  MSK/EXT: Left BKA.  Chronic venous insufficiency.  1-2 edema. SKIN: no apparent skin lesion or wound NEURO: Awake.  More alert today.  Fairly oriented.  Follows some commands. PSYCH: Somewhat flat  affect.  Procedures:  None  Microbiology summarized: None  Assessment and plan: Principal Problem:   Pancytopenia (HCC) Active Problems:   Acute renal failure superimposed on stage 4 chronic kidney disease (HCC)   Acute on chronic heart failure with preserved ejection fraction (HFpEF) (HCC)   Essential hypertension   History of CVA (cerebrovascular accident)   T2DM (type 2 diabetes mellitus) (HCC)   PAD (peripheral artery disease) (HCC)   Depression   Protein-calorie malnutrition, moderate (HCC)  Pancytopenia: Looks like chronic issue.  Anemia panel consistent with anemia of chronic disease.  No overt bleeding.  LDH elevated.  Haptoglobin within normal. -Nephrology started Aranesp -Will consult hematology if worse or no improvement after Aranesp   AKI on CKD stage IV/azotemia: Nephrology planning [facility] and HD Recent Labs    07/20/22 0747 08/08/22 2028 08/09/22 0532 08/10/22 0031 08/11/22 0049 08/12/22 0121 08/13/22 0036 08/14/22 0129 08/15/22 0057 08/16/22 0102  BUN 61* 80* 74* 78* 90* 88* 97* 100* 106* 104*  CREATININE 3.74* 5.26* 5.09* 5.30* 5.31* 5.41* 5.58* 5.55* 5.48* 5.61*  -Nephrology requested [facility] to start dialysis. -Palliative medicine following. -Continue monitoring  Acute metabolic encephalopathy: Uremic encephalopathy?  He seems awake and more alert today. -Nephrology and palliative medicine following.   Acute on chronic diastolic heart failure: Difficult to manage with worsening renal function. -Diuretics per nephrology.  Plan for dialysis after [facility] placement  Urinary retention: Foley  catheter placed on 6/17. -Continue Foley catheter and Flomax.   Primary hypertension: BP slightly elevated.  Likely due to fluid overload -Continue carvedilol, amlodipine, hydralazine   Diabetes mellitus type 2 -Continue sliding scale insulin   Peripheral artery disease/left BKA. -Continue Lipitor, aspirin   Depression -Continue sertraline  Hypokalemia:  Mild. -Per nephrology.   Goal of care: DNR/DNI.  Difficult to engage patient for goal of care discussion. -Palliative medicine following  Grief: Lost his brother 2 days prior to this hospitalization. -Appreciate emotional support by chaplain.  Morbid obesity/moderate malnutrition Body mass index is 42.1 kg/m. Nutrition Problem: Moderate Malnutrition Etiology: chronic illness (HF, PAD) Signs/Symptoms: mild fat depletion, mild muscle depletion, edema Interventions: Ensure Enlive (each supplement provides 350kcal and 20 grams of protein), MVI   DVT prophylaxis:  Place and maintain sequential compression device Start: 08/15/22 0726 SCDs Start: 08/08/22 2241  Code Status: DNR/DNI Family Communication: None at bedside. Level of care: Progressive Status is: Inpatient Remains inpatient appropriate because: Renal failure/acute on chronic CHF/pancytopenia   Consultants:  Nephrology Palliative medicine  35 minutes with more than 50% spent in reviewing records, counseling patient/family and coordinating care.   Sch Meds:  Scheduled Meds:  amLODipine  10 mg Oral Daily   aspirin  81 mg Oral Daily   atorvastatin  80 mg Oral QHS   carvedilol  12.5 mg Oral BID WC   Chlorhexidine Gluconate Cloth  6 each Topical Daily   Chlorhexidine Gluconate Cloth  6 each Topical Q0600   darbepoetin (ARANESP) injection - NON-DIALYSIS  40 mcg Subcutaneous Q Thu-1800   feeding supplement  237 mL Oral BID BM   hydrALAZINE  50 mg Oral Q8H   insulin aspart  0-5 Units Subcutaneous QHS   insulin aspart  0-6 Units Subcutaneous TID WC   multivitamin with minerals  1 tablet Oral Daily   mouth rinse  15 mL Mouth Rinse 4 times per day   pantoprazole  40 mg Oral Daily   sertraline  50 mg Oral QODAY   sodium chloride flush  3 mL Intravenous Q12H   tamsulosin  0.4 mg Oral QPC supper   Continuous Infusions: PRN Meds:.acetaminophen **OR** acetaminophen, albuterol, ALPRAZolam, HYDROmorphone (DILAUDID) injection,  ondansetron (ZOFRAN) IV, mouth rinse, mouth rinse, white petrolatum  Antimicrobials: Anti-infectives (From admission, onward)    Start     Dose/Rate Route Frequency Ordered Stop   08/16/22 1147  ceFAZolin (ANCEF) IVPB 2g/100 mL premix        over 30 Minutes Intravenous Continuous PRN 08/16/22 1147 08/16/22 1147        I have personally reviewed the following labs and images: CBC: Recent Labs  Lab 08/13/22 0036 08/14/22 0129 08/15/22 0057 08/16/22 0102 08/16/22 0902  WBC 1.6* 1.4* 1.4* 1.3* 1.2*  NEUTROABS 0.6*  --   --   --   --   HGB 7.4* 7.1* 7.5* 7.2* 7.3*  HCT 23.0* 22.7* 23.6* 22.7* 22.6*  MCV 88.8 87.3 88.4 87.3 88.3  PLT 48* 52* 58* 59* 61*   BMP &GFR Recent Labs  Lab 08/12/22 0121 08/13/22 0036 08/14/22 0129 08/15/22 0057 08/16/22 0102  NA 138 135 135 135 135  K 4.5 4.1 3.8 3.6 3.4*  CL 102 99 101 101 102  CO2 21* 20* 21* 21* 21*  GLUCOSE 122* 143* 122* 166* 122*  BUN 88* 97* 100* 106* 104*  CREATININE 5.41* 5.58* 5.55* 5.48* 5.61*  CALCIUM 7.6* 7.5* 7.5* 7.7* 7.7*  MG  --   --  2.0 2.0  --  PHOS  --   --  5.5* 5.6* 5.4*   Estimated Creatinine Clearance: 17.9 mL/min (A) (by C-G formula based on SCr of 5.61 mg/dL (H)). Liver & Pancreas: Recent Labs  Lab 08/14/22 0129 08/15/22 0057 08/16/22 0102  ALBUMIN 2.5* 2.5* 2.5*   No results for input(s): "LIPASE", "AMYLASE" in the last 168 hours. No results for input(s): "AMMONIA" in the last 168 hours. Diabetic: No results for input(s): "HGBA1C" in the last 72 hours. Recent Labs  Lab 08/15/22 1135 08/15/22 1623 08/15/22 2047 08/16/22 0618 08/16/22 1333  GLUCAP 140* 149* 120* 97 128*   Cardiac Enzymes: No results for input(s): "CKTOTAL", "CKMB", "CKMBINDEX", "TROPONINI" in the last 168 hours. No results for input(s): "PROBNP" in the last 8760 hours. Coagulation Profile: No results for input(s): "INR", "PROTIME" in the last 168 hours. Thyroid Function Tests: No results for input(s): "TSH",  "T4TOTAL", "FREET4", "T3FREE", "THYROIDAB" in the last 72 hours. Lipid Profile: No results for input(s): "CHOL", "HDL", "LDLCALC", "TRIG", "CHOLHDL", "LDLDIRECT" in the last 72 hours. Anemia Panel: No results for input(s): "VITAMINB12", "FOLATE", "FERRITIN", "TIBC", "IRON", "RETICCTPCT" in the last 72 hours. Urine analysis:    Component Value Date/Time   COLORURINE YELLOW 08/09/2022 0530   APPEARANCEUR CLEAR 08/09/2022 0530   APPEARANCEUR Clear 12/31/2013 2116   LABSPEC 1.025 08/09/2022 0530   LABSPEC 1.032 12/31/2013 2116   PHURINE 5.5 08/09/2022 0530   GLUCOSEU NEGATIVE 08/09/2022 0530   GLUCOSEU >=500 12/31/2013 2116   HGBUR NEGATIVE 08/09/2022 0530   BILIRUBINUR NEGATIVE 08/09/2022 0530   BILIRUBINUR Negative 12/31/2013 2116   KETONESUR NEGATIVE 08/09/2022 0530   PROTEINUR 100 (A) 08/09/2022 0530   UROBILINOGEN 1.0 08/28/2013 1709   NITRITE NEGATIVE 08/09/2022 0530   LEUKOCYTESUR NEGATIVE 08/09/2022 0530   LEUKOCYTESUR Negative 12/31/2013 2116   Sepsis Labs: Invalid input(s): "PROCALCITONIN", "LACTICIDVEN"  Microbiology: No results found for this or any previous visit (from the past 240 hour(s)).  Radiology Studies: No results found.    Lexander Tremblay T. Nichola Warren [facility]  If 7PM-7AM, please contact night-coverage [email or web address] 08/16/2022, 1:59 PM

## 2022-08-16 NOTE — Consult Note (Signed)
Chief Complaint: Patient was seen in consultation today for end stage renal disease.  Referring Physician(s): Crista Elliot, MD  Supervising Physician: Simonne Come  Patient Status: [facility] - In-pt  History of Present Illness: [name] is a 62 y.o. male with a past medical history significant for depression, Bipolar disorder, DM, HTN, CHF, PAD, CVA, pancytopenia, anemia and AKI on CKD IV who presented to the ED 08/08/22 from [facility] due to low hemoglobin. On initial evaluation he was found to have BUN 80, creatinine 5.6, WBC 1.9, hgb 6.9, plt 57, BNP 499. Due to acute renal failure nephrology was consulted for initiation of HD which patient had previously declined however after further discussions he has decided to pursue HD. IR has been consulted for tunneled HD catheter placement to initiate HD.  Patient seen in IR holding, he is somnolent but arouses to verbal cues - needs frequent encouragement to stay awake but is able to demonstrate understanding and agreement to proceed.   Past Medical History:  Diagnosis Date   Acute on chronic diastolic CHF (congestive heart failure) (HCC) 08/22/2020   Acute on chronic heart failure with preserved ejection fraction (HCC)    Acute renal failure superimposed on stage 3a chronic kidney disease (HCC) 11/18/2020   Arthritis    Bacteremia due to Enterococcus 12/09/2021   Bipolar affective (HCC)    CHF (congestive heart failure) (HCC)    Claustrophobia    Severe   Cocaine abuse (HCC) 05/31/2014   Cocaine dependence, uncomplicated (HCC) 05/31/2014   Depression    Diabetes mellitus    uncontrolled    Elevated CK    Heart murmur    Hypertension    Ileus (HCC) 11/18/2020   Metabolic acidosis 10/23/2021   Non-traumatic rhabdomyolysis 01/01/2020   Pericardial effusion 08/22/2020   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   Suicidal ideations 08/28/2013    Past Surgical History:  Procedure Laterality Date    ABDOMINAL AORTOGRAM W/LOWER EXTREMITY Left 03/02/2021   Procedure: ABDOMINAL AORTOGRAM W/LOWER EXTREMITY;  Surgeon: Leonie Douglas, MD;  Location: [facility];  Service: Cardiovascular;  Laterality: Left;   AMPUTATION Left 03/07/2021   Procedure: LEFT BELOW KNEE AMPUTATION;  Surgeon: Leonie Douglas, MD;  Location: [facility] OR;  Service: Vascular;  Laterality: Left;   APPLICATION OF WOUND VAC Left 03/07/2021   Procedure: APPLICATION OF WOUND VAC;  Surgeon: Leonie Douglas, MD;  Location: [facility] OR;  Service: Vascular;  Laterality: Left;   BOWEL DECOMPRESSION N/A 01/19/2020   Procedure: BOWEL DECOMPRESSION;  Surgeon: Kathi Der, MD;  Location: [facility] ENDOS[facility];  Service: Gastroenterology;  Laterality: N/A;   BOWEL DECOMPRESSION N/A 11/19/2020   Procedure: BOWEL DECOMPRESSION;  Surgeon: Charlott Rakes, MD;  Location: [facility] ENDOSCOPY;  Service: Endoscopy;  Laterality: N/A;   BOWEL DECOMPRESSION N/A 12/03/2020   Procedure: BOWEL DECOMPRESSION;  Surgeon: Kerin Salen, MD;  Location: [facility] ENDOSCOPY;  Service: Gastroenterology;  Laterality: N/A;   COLONOSCOPY WITH PROPOFOL N/A 01/19/2020   Procedure: COLONOSCOPY WITH PROPOFOL;  Surgeon: Kathi Der, MD;  Location: [facility];  Service: Gastroenterology;  Laterality: N/A;   COLONOSCOPY WITH PROPOFOL N/A 11/19/2020   Procedure: COLONOSCOPY WITH PROPOFOL;  Surgeon: Charlott Rakes, MD;  Location: [facility];  Service: Endoscopy;  Laterality: N/A;   COLONOSCOPY WITH PROPOFOL N/A 12/03/2020   Procedure: COLONOSCOPY WITH PROPOFOL;  Surgeon: Kerin Salen, MD;  Location: [facility];  Service: Gastroenterology;  Laterality: N/A;   KNEE SURGERY     b/l knees; pending total  knee    RADIOLOGY WITH ANESTHESIA N/A 07/04/2020   Procedure: MRI WITH ANESTHESIA BRAIN WITHOUT CONTRAST AND HEAD WITHOUT CONTRAST;  Surgeon: Radiologist, Medication, MD;  Location: [facility] OR;  Service: Radiology;  Laterality: N/A;   TRANSMETATARSAL AMPUTATION Left 03/01/2021   Procedure:  TRANSMETATARSAL AMPUTATION;  Surgeon: Vivi Barrack, DPM;  Location: [facility] OR;  Service: Podiatry;  Laterality: Left;    Allergies: Other and Sertraline  Medications: Prior to Admission medications   Medication Sig Start Date End Date Taking? Authorizing Provider  acetaminophen (TYLENOL) 650 MG CR tablet Take 650 mg by mouth every 4 (four) hours as needed (for discomfort- cannot exceed 3,000 mg from all combined sources/24 hours).   Yes [provider]  albuterol (VENTOLIN HFA) 108 (90 Base) MCG/ACT inhaler Inhale 2 puffs into the lungs every 6 (six) hours as needed for wheezing or shortness of breath.   Yes [provider]  amLODipine (NORVASC) 10 MG tablet Take 1 tablet (10 mg total) by mouth daily. Patient taking differently: Take 10 mg by mouth See admin instructions. Take 10 mg by mouth once a day and hold if Systolic is <100 or Diastolic <29. Call NP is B/P is over 160/90. 12/11/21  Yes [name], [name], DO  aspirin 81 MG chewable tablet Chew 81 mg by mouth daily.   Yes [provider]  atorvastatin (LIPITOR) 80 MG tablet Take 80 mg by mouth at bedtime.   Yes [provider]  brimonidine (ALPHAGAN) 0.2 % ophthalmic solution Place 1 drop into both eyes 2 (two) times daily.   Yes [provider]  carvedilol (COREG) 12.5 MG tablet Take 1 tablet (12.5 mg total) by mouth 2 (two) times daily with a meal. 04/02/22  Yes [name], [name], MD  docusate sodium (COLACE) 100 MG capsule Take 100 mg by mouth at bedtime.   Yes [provider]  dorzolamide (TRUSOPT) 2 % ophthalmic solution Place 1 drop into both eyes 2 (two) times daily.   Yes [provider]  ferrous sulfate 325 (65 FE) MG tablet Take 1 tablet (325 mg total) by mouth daily. 09/23/20  Yes [name], [name], DO  hydrALAZINE (APRESOLINE) 50 MG tablet Take 1 tablet (50 mg total) by mouth every 8 (eight) hours. Patient taking differently: Take 50 mg by mouth See admin  instructions. Take 50 mg by mouth at 4 AM, 10 AM, 4 PM, and 10 PM- hold for a Systolic reading less than 100 04/02/22  Yes [name], [name], MD  insulin aspart (NOVOLOG FLEXPEN) 100 UNIT/ML FlexPen Inject 2-10 Units into the skin 3 (three) times daily before meals. Inject 2-10 units into the skin three times a day before meals, per sliding scale: BGL 131-180 = 2 units; 181-240 = 4 units; 241-300 = 6 units; 301-350 = 8 units; 351-400 = 10 units; >400 = CALL MD   Yes [provider]  LANTUS SOLOSTAR 100 UNIT/ML Solostar Pen Inject 6 Units into the skin in the morning.   Yes [provider]  latanoprost (XALATAN) 0.005 % ophthalmic solution Place 1 drop into both eyes at bedtime.   Yes [provider]  Multiple Vitamin (MULTIVITAMIN) tablet Take 1 tablet by mouth daily.   Yes [provider]  ondansetron (ZOFRAN) 4 MG tablet Take 4 mg by mouth every 4 (four) hours as needed for nausea or vomiting.   Yes [provider]  OXYGEN Inhale 2 L/min into the lungs as needed (for sats below 90%).   Yes [provider]  pantoprazole (PROTONIX) 40 MG tablet Take 1 tablet (40 mg total) by mouth 2 (two) times daily. 07/20/22  Yes [name], Eric J, DO  potassium chloride SA (KLOR-CON M) 20 MEQ tablet Take 20 mEq by mouth in the morning.   Yes [provider]  pregabalin (LYRICA) 75 MG capsule Take 1 capsule (75 mg total) by mouth daily. 10/31/21  Yes Maxwell, Allee, MD  PROTEIN PO Take 30 mLs by mouth 2 (two) times daily.   Yes [provider]  PROTEIN PO Take 30 mLs by mouth 2 (two) times daily. 0900 and 2100   Yes [provider]  sertraline (ZOLOFT) 50 MG tablet Take 50 mg by mouth every other day.   Yes [provider]  sodium bicarbonate 650 MG tablet Take 2 tablets (1,300 mg total) by mouth 2 (two) times daily. 10/31/21  Yes Alfredo Martinez, MD  tamsulosin (FLOMAX) 0.4 MG CAPS capsule Take 0.8 mg by mouth every evening.   Yes  [provider]  torsemide (DEMADEX) 20 MG tablet Take 40-80 mg by mouth See admin instructions. Take 80 mg by mouth at 8 AM and 40 mg by mouth at 7 PM   Yes [provider]     Family History  Problem Relation Age of Onset   Diabetes Other    Cancer Mother        uterus; deceased    Heart attack Mother 34   Hypertension Other    Heart attack Father 82   Stroke Sister        12/2012 age 13 y.o    Social History   Socioeconomic History   Marital status: Single    Spouse name: Not on file   Number of children: Not on file   Years of education: Not on file   Highest education level: Not on file  Occupational History   Not on file  Tobacco Use   Smoking status: Former   Smokeless tobacco: Never  Vaping Use   Vaping Use: Never used  Substance and Sexual Activity   Alcohol use: No   Drug use: Not Currently    Types: Cocaine    Comment: Last use 2019   Sexual activity: Yes    Birth control/protection: Condom  Other Topics Concern   Not on file  Social History Narrative   Moved from [place]   Denies smoking, drinking   +cocaine history (denies recent use)   Originally from [place]    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[employer]   No kids, married                      Social Determinants of Corporate investment banker Strain: Not on file  Food Insecurity: No Food Insecurity (07/18/2022)   Hunger Vital Sign    Worried About Running Out of Food in the Last Year: Never true    Ran Out of Food in the Last Year: Never true  Transportation Needs: No Transportation Needs (07/18/2022)   PRAPARE - Administrator, Civil Service (Medical): No    Lack of Transportation (Non-Medical): No  Physical Activity: Not on file  Stress: Not on file  Social Connections: Not on file     Review of Systems: A 12 point ROS discussed and pertinent positives are indicated in the HPI above.  All other systems are  negative.  Review of Systems  Constitutional:  Positive  for fatigue. Negative for fever.       Limited ROS due to somnolence  Respiratory:  Negative for shortness of breath.   Gastrointestinal:  Positive for nausea. Negative for vomiting.    Vital Signs: BP (!) 145/81 (BP Location: Left Arm)   Pulse 73   Temp 98.7 F (37.1 C) (Oral)   Resp 14   Ht 5\' 8"  (1.727 m)   Wt 276 lb 14.4 oz (125.6 kg)   SpO2 95%   BMI 42.10 kg/m   Physical Exam Vitals and nursing note reviewed.  Constitutional:      General: He is not in acute distress.    Appearance: He is ill-appearing.     Comments: Somnolent, arouses to verbal cues  HENT:     Mouth/Throat:     Mouth: Mucous membranes are dry.     Pharynx: Oropharynx is clear. No oropharyngeal exudate or posterior oropharyngeal erythema.  Cardiovascular:     Rate and Rhythm: Normal rate and regular rhythm.  Pulmonary:     Effort: Pulmonary effort is normal.     Breath sounds: Normal breath sounds.  Abdominal:     Palpations: Abdomen is soft.  Skin:    General: Skin is warm and dry.  Neurological:     Mental Status: He is oriented to person, place, and time.      MD Evaluation Airway: WNL Heart: WNL Abdomen: WNL Chest/ Lungs: WNL ASA  Classification: 3 Mallampati/Airway Score: Two   Imaging: DG Chest 2 View  Result Date: 08/08/2022 CLINICAL DATA:  416606 with dyspnea. EXAM: CHEST - 2 VIEW COMPARISON:  Portable chest 07/17/2022, CT chest no contrast 03/17/2022 FINDINGS: The heart is moderately enlarged. Perihilar vascular congestion and edema are again noted. There is patchy airspace disease in the lung fields which could be airspace edema, pneumonia or combination. There are small layering pleural effusions. Mediastinum is normally outlined. There are degenerative changes and mild dextroscoliosis of the thoracic spine. Similar findings were noted on both prior studies. IMPRESSION: 1. Cardiomegaly with perihilar vascular congestion  and edema. 2. Patchy airspace disease in the lung fields which could be airspace edema, pneumonia or combination. 3. Small layering pleural effusions. 4. Clinical correlation and radiographic follow-up recommended. Electronically Signed   By: Almira Bar M.D.   On: 08/08/2022 21:18   DG Chest Port 1 View  Result Date: 07/17/2022 CLINICAL DATA:  Shortness of breath. EXAM: PORTABLE CHEST 1 VIEW COMPARISON:  Chest radiograph dated 03/23/2022. FINDINGS: There is cardiomegaly with vascular congestion and edema. Pneumonia is not excluded. No pleural effusion pneumothorax the degenerative changes of the acute osseous pathology. IMPRESSION: Cardiomegaly with vascular congestion and edema. Electronically Signed   By: Elgie Collard M.D.   On: 07/17/2022 19:43    Labs:  CBC: Recent Labs    08/14/22 0129 08/15/22 0057 08/16/22 0102 08/16/22 0902  WBC 1.4* 1.4* 1.3* 1.2*  HGB 7.1* 7.5* 7.2* 7.3*  HCT 22.7* 23.6* 22.7* 22.6*  PLT 52* 58* 59* 61*    COAGS: Recent Labs    12/08/21 1919  INR 1.3*    BMP: Recent Labs    08/13/22 0036 08/14/22 0129 08/15/22 0057 08/16/22 0102  NA 135 135 135 135  K 4.1 3.8 3.6 3.4*  CL 99 101 101 102  CO2 20* 21* 21* 21*  GLUCOSE 143* 122* 166* 122*  BUN 97* 100* 106* 104*  CALCIUM 7.5* 7.5* 7.7* 7.7*  CREATININE 5.58* 5.55* 5.48* 5.61*  GFRNONAA 11* 11* 11* 11*  LIVER FUNCTION TESTS: Recent Labs    03/17/22 1440 03/18/22 0404 03/25/22 0935 07/17/22 1815 08/08/22 2028 08/14/22 0129 08/15/22 0057 08/16/22 0102  BILITOT 0.5 0.4  --  0.7 0.6  --   --   --   AST 16 10*  --  19 28  --   --   --   ALT 18 15  --  18 27  --   --   --   ALKPHOS 115 91  --  94 73  --   --   --   PROT 7.0 6.4*  --  8.2* 7.7  --   --   --   ALBUMIN 2.7* 2.5*   < > 3.1* 2.8* 2.5* 2.5* 2.5*   < > = values in this interval not displayed.    TUMOR MARKERS: No results for input(s): "AFPTM", "CEA", "CA199", "CHROMGRNA" in the last 8760 hours.  Assessment and  Plan:  62 y/o M with history of CKD IV who presented to [facility] ED 08/08/22 found to have AKI on CKD. He had previously declined HD however he has recently changed his mind and is agreeable to proceed with HD. IR has been consulted for tunneled HD catheter placement for access.  Risks and benefits discussed with the patient including, but not limited to bleeding, infection, vascular injury, pneumothorax which may require chest tube placement, air embolism or even death.  All of the patient's questions were answered, patient is agreeable to proceed.  Verbal consent from patient obtained with IR RN witnessing, patient states too tired to physically sign consent but does understand indications, risks and alternatives. Consent signed and in chart.  Thank you for this interesting consult.  I greatly enjoyed meeting WALLY SHEVCHENKO and look forward to participating in their care.  A copy of this report was sent to the requesting provider on this date.  Electronically Signed: Villa Herb, PA-C 08/16/2022, 10:44 AM   I spent a total of 40 Minutes in face to face in clinical consultation, greater than 50% of which was counseling/coordinating care for ESRD.

## 2022-08-16 NOTE — Progress Notes (Signed)
   08/16/22 1023  Spiritual Encounters  Type of Visit Attempt (pt unavailable)  OnCall Visit No   Attempted to follow-up with patient however patient declined visit as he "wasn't up to it" this morning and needed his rest. Advised patient that chaplain will make another visit later this afternoon.

## 2022-08-17 DIAGNOSIS — D61818 Other pancytopenia: Secondary | ICD-10-CM | POA: Diagnosis not present

## 2022-08-17 DIAGNOSIS — I10 Essential (primary) hypertension: Secondary | ICD-10-CM | POA: Diagnosis not present

## 2022-08-17 DIAGNOSIS — I5033 Acute on chronic diastolic (congestive) heart failure: Secondary | ICD-10-CM | POA: Diagnosis not present

## 2022-08-17 DIAGNOSIS — E1122 Type 2 diabetes mellitus with diabetic chronic kidney disease: Secondary | ICD-10-CM | POA: Diagnosis not present

## 2022-08-17 LAB — RENAL FUNCTION PANEL
Albumin: 2.5 g/dL — ABNORMAL LOW (ref 3.5–5.0)
Anion gap: 12 (ref 5–15)
BUN: 87 mg/dL — ABNORMAL HIGH (ref 8–23)
CO2: 23 mmol/L (ref 22–32)
Calcium: 7.8 mg/dL — ABNORMAL LOW (ref 8.9–10.3)
Chloride: 101 mmol/L (ref 98–111)
Creatinine, Ser: 4.94 mg/dL — ABNORMAL HIGH (ref 0.61–1.24)
GFR, Estimated: 13 mL/min — ABNORMAL LOW (ref >60)
Glucose, Bld: 133 mg/dL — ABNORMAL HIGH (ref 70–99)
Phosphorus: 4.8 mg/dL — ABNORMAL HIGH (ref 2.5–4.6)
Potassium: 3.3 mmol/L — ABNORMAL LOW (ref 3.5–5.1)
Sodium: 136 mmol/L (ref 135–145)

## 2022-08-17 LAB — GLUCOSE, CAPILLARY
Glucose-Capillary: 137 mg/dL — ABNORMAL HIGH (ref 70–99)
Glucose-Capillary: 130 mg/dL — ABNORMAL HIGH (ref 70–99)
Glucose-Capillary: 117 mg/dL — ABNORMAL HIGH (ref 70–99)
Glucose-Capillary: 157 mg/dL — ABNORMAL HIGH (ref 70–99)

## 2022-08-17 LAB — CBC
HCT: 21.4 % — ABNORMAL LOW (ref 39.0–52.0)
Hemoglobin: 6.8 g/dL — CL (ref 13.0–17.0)
MCH: 27.9 pg (ref 26.0–34.0)
MCHC: 31.8 g/dL (ref 30.0–36.0)
MCV: 87.7 fL (ref 80.0–100.0)
Platelets: 62 10*3/uL — ABNORMAL LOW (ref 150–400)
RBC: 2.44 MIL/uL — ABNORMAL LOW (ref 4.22–5.81)
RDW: 17.2 % — ABNORMAL HIGH (ref 11.5–15.5)
WBC: 1.2 10*3/uL — CL (ref 4.0–10.5)
nRBC: 0 % (ref 0.0–0.2)

## 2022-08-17 LAB — MAGNESIUM: Magnesium: 2.1 mg/dL (ref 1.7–2.4)

## 2022-08-17 LAB — HEPATITIS B SURFACE ANTIBODY, QUANTITATIVE: Hep B S AB Quant (Post): 44.5 m[IU]/mL (ref >9.9)

## 2022-08-17 IMAGING — DX DG ABD PORTABLE 1V
2 series · 2 of 2 positions shown · non-contrast
Comparison: 12/06/2020

CLINICAL DATA: Ogilvie's syndrome

EXAM:
PORTABLE ABDOMEN - 1 VIEW

[abdomen kub (1 of 2)]
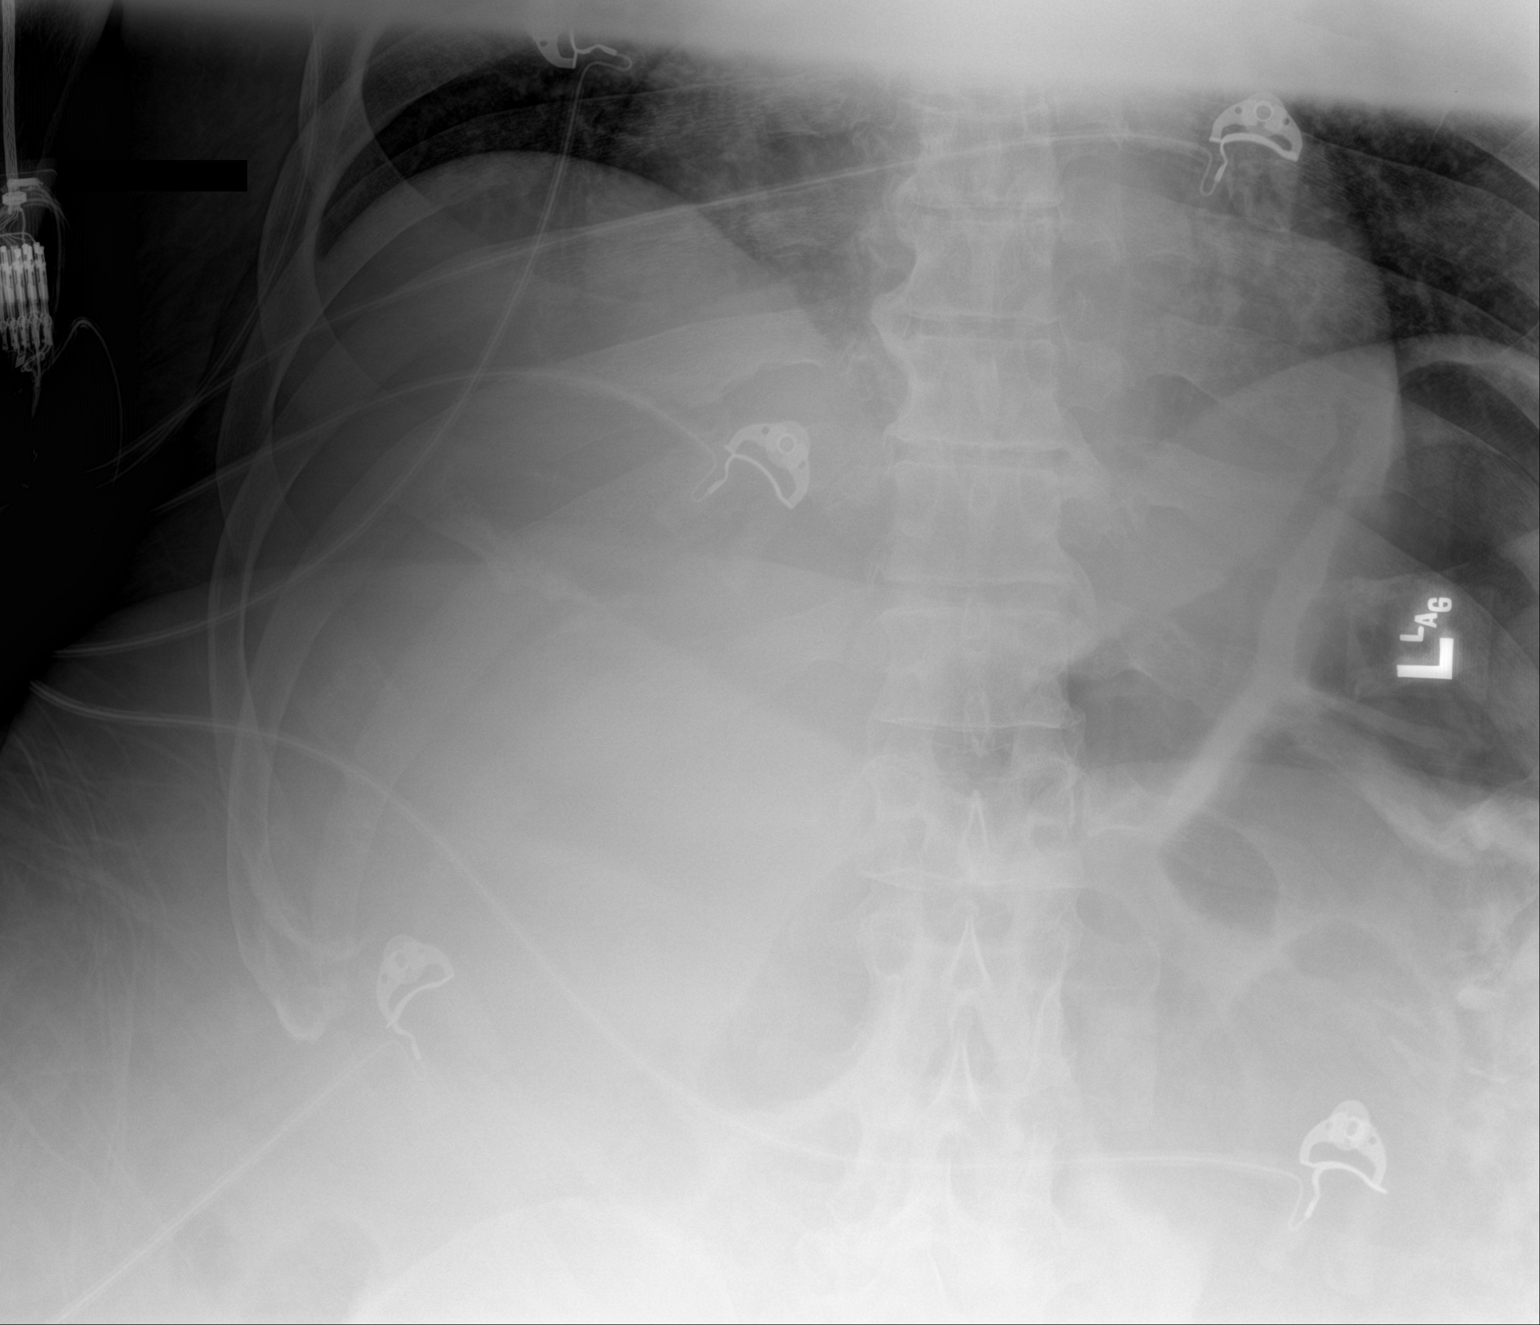

[abdomen kub (2 of 2)]
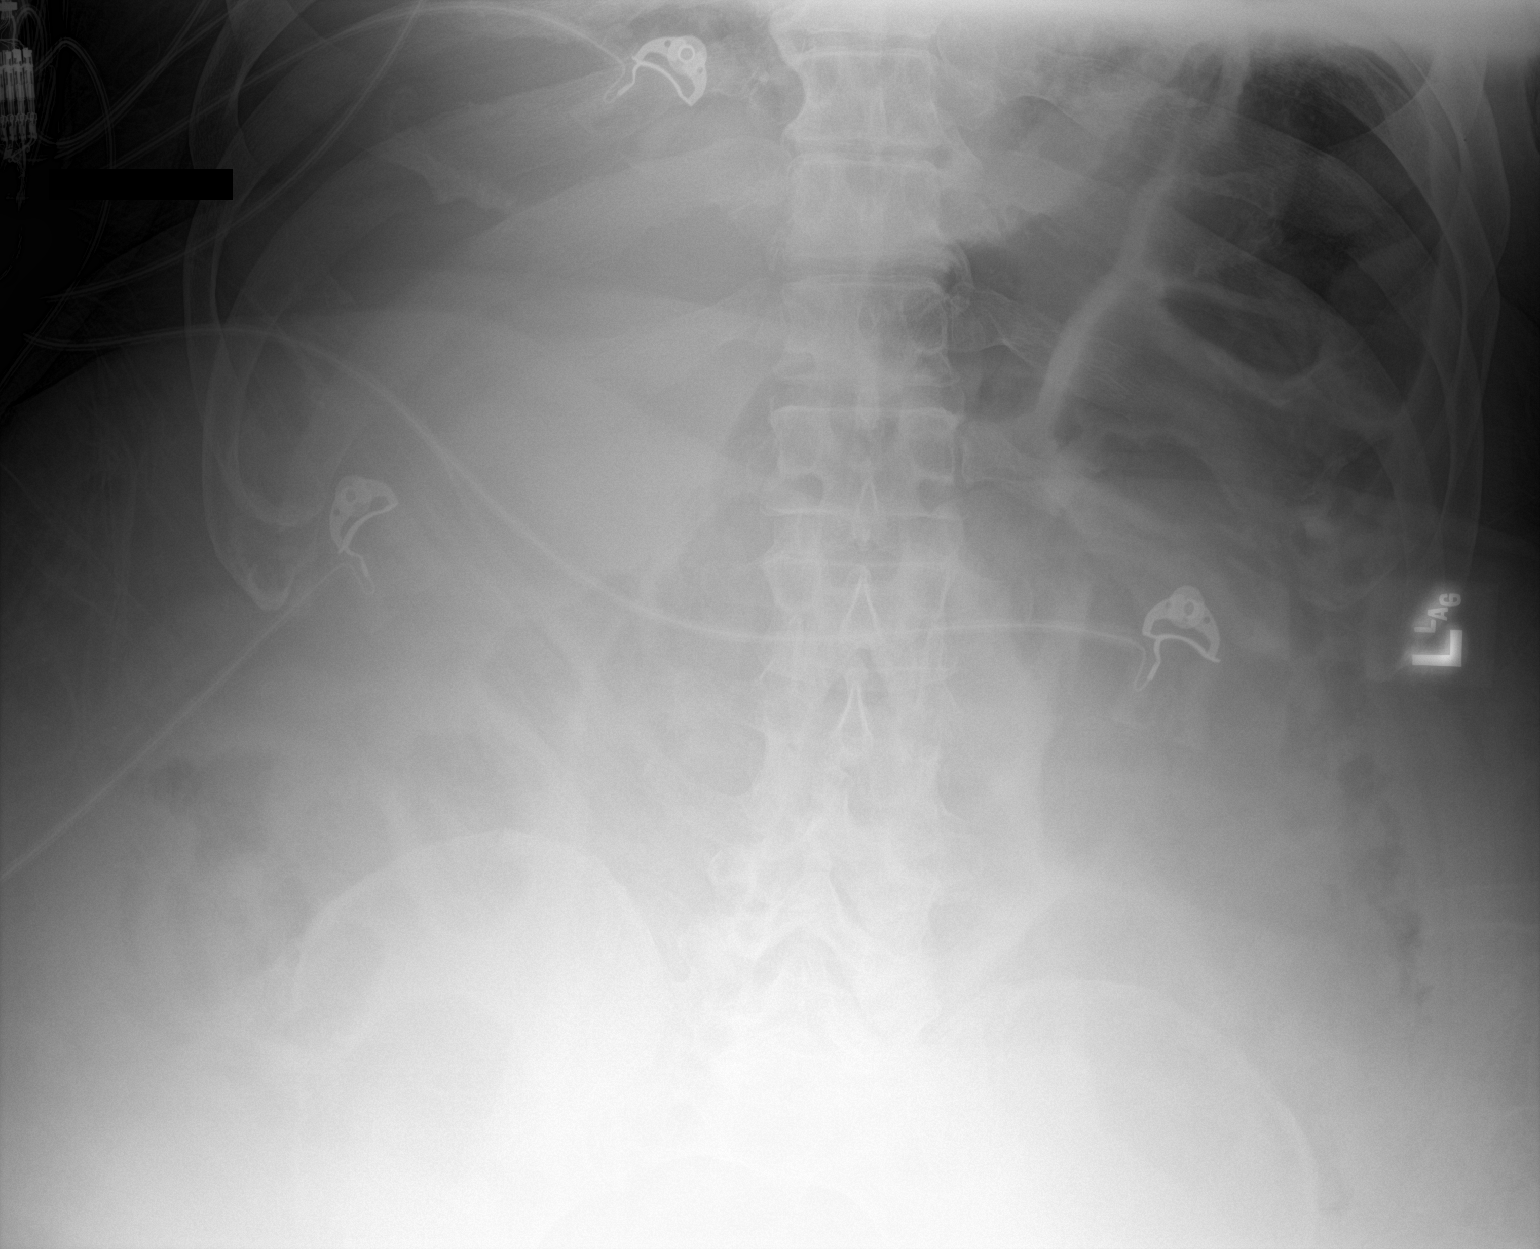

[2 of 2 positions shown; findings below may reference images not displayed]

FINDINGS: Diminished exam detail due to body habitus. Continued interval
improvement in the appearance of gaseous distension of the
transverse colon. No radio-opaque calculi or other significant
radiographic abnormality are seen.
IMPRESSION: Continued interval improvement in gaseous distension of the
transverse colon.

## 2022-08-17 MED ORDER — CHLORHEXIDINE GLUCONATE CLOTH 2 % EX PADS
6.0000 | MEDICATED_PAD | Freq: Every day | CUTANEOUS | Status: DC
  Administered 2022-08-17: 6 via TOPICAL

## 2022-08-17 NOTE — Progress Notes (Signed)
OT Cancellation Note  Patient Details Name: KODIAK ROLLYSON MRN: [id number] DOB: 1960-11-15   Cancelled Treatment:    Reason Eval/Treat Not Completed: Patient declined, no reason specified (Attempted to engage pt in therapy session for 20 mins with PT, but pt refusing despite max encouragement. Pt stating he wants to rest, OT will continue to follow-up with patient as able.)  08/17/2022  AB, OTR/L  Acute Rehabilitation Services  Office: [phone]   Tristan Schroeder 08/17/2022, 10:05 AM

## 2022-08-17 NOTE — Progress Notes (Signed)
PT Cancellation Note  Patient Details Name: [name] MRN: [id number] DOB: 1960/11/02   Cancelled Treatment:    Reason Eval/Treat Not Completed: Other (comment). Patient declined stating he didn't get any rest. Attempted to engage pt in therapy session for 20 mins with OT at 8:12 am, but pt refusing despite max encouragement. Pt stating to give him an hour. PT then returned at [facility] and continued to refuse keeping blanket over his head. PT will continue to follow-up with patient as able.  Lewis Shock, PT, DPT Acute Rehabilitation Services Secure chat preferred Office #: [phone]    Iona Hansen 08/17/2022, 12:01 PM

## 2022-08-17 NOTE — Progress Notes (Addendum)
PROGRESS NOTE  [name] YNW:[id number] DOB: 1960-07-04   PCP: [name], MD  Patient is from: SNF.  DOA: 08/08/2022 LOS: 9  Chief complaints Chief Complaint  Patient presents with   abnormal labs      Brief Narrative / Interim history:  62 y.o. male with a history of diabetes mellitus type 2, hypertension, CKD stage IV, PVD/left BKA, CVA, heart failure and pancytopenia.  Patient presented secondary to low hemoglobin.  He was transfused 1 unit with improvement in hemoglobin.  Hospitalization complicated by AKI on CKD-4 and acute on chronic CHF.  Nephrology consulted and started on Lasix drip.  AKI worse.  Initially felt to be not a good candidate for hemodialysis based on his functional status and refusal to HD.  Palliative consultation was recommended by nephrology.  However, he later changed his mind and agreed to have dialysis.  Had [facility] placed on 6/21.  Started dialysis on 6/22.  Palliative medicine following as well.   Subjective: Seen and examined earlier this morning.  No major events overnight of this morning.  Does not want to be bothered.  He states he could not sleep as everybody kept on coming in.  "I need to sleep.  I need a break".  Objective: Vitals:   08/17/22 1412 08/17/22 1430 08/17/22 1500 08/17/22 1530  BP: (!) 152/80 (!) 163/87 (!) 162/82 (!) 156/82  Pulse: 66 68 69 67  Resp: 14 11 12 11   Temp:      TempSrc:      SpO2: 95% 96% 97% 97%  Weight:      Height:        Examination:  GENERAL: No apparent distress.  Sleepy. HEENT: MMM.  Vision and hearing grossly intact.  NECK: Supple.  No apparent JVD.  RESP:  No IWOB.  Fair aeration bilaterally. CVS:  RRR. Heart sounds normal.  ABD/GI/GU: BS+. Abd soft, NTND.  MSK/EXT: Left BKA.  Chronic venous insufficiency.  2+ edema. SKIN: no apparent skin lesion or wound NEURO: Awake and alert.  Fairly oriented.   PSYCH: Somewhat flat affect.  Procedures:  None  Microbiology  summarized: None  Assessment and plan: Principal Problem:   Pancytopenia (HCC) Active Problems:   Acute renal failure superimposed on stage 4 chronic kidney disease (HCC)   Acute on chronic heart failure with preserved ejection fraction (HFpEF) (HCC)   Essential hypertension   History of CVA (cerebrovascular accident)   T2DM (type 2 diabetes mellitus) (HCC)   PAD (peripheral artery disease) (HCC)   Depression   Protein-calorie malnutrition, moderate (HCC)  Pancytopenia: Looks like chronic issue.  Anemia panel consistent with anemia of chronic disease.  No overt bleeding.  LDH elevated.  Haptoglobin within normal. -Nephrology started Aranesp -Hematology consult if worse or no improvement after Aranesp and HD   AKI on CKD stage IV/azotemia: Started HD on 6/22. Recent Labs    08/09/22 0532 08/10/22 0031 08/11/22 0049 08/12/22 0121 08/13/22 0036 08/14/22 0129 08/15/22 0057 08/16/22 0102 08/16/22 2022 08/17/22 0741  BUN 74* 78* 90* 88* 97* 100* 106* 104* 111* 87*  CREATININE 5.09* 5.30* 5.31* 5.41* 5.58* 5.55* 5.48* 5.61* 5.93* 4.94*  -Per nephrology -[facility] placed on 6/21.  HD started on 6/22. -Palliative medicine following. -Continue monitoring  Acute metabolic encephalopathy: Seems to have resolved. -Nephrology and palliative medicine following.   Acute on chronic diastolic heart failure: Difficult to manage with worsening renal function. -Fluid management by HD per nephrology  Urinary retention: Foley catheter placed on 6/17. -Continue  Foley catheter and Flomax.   Primary hypertension: BP slightly elevated.  Likely due to fluid overload -Anticipate improvement with dialysis -Continue carvedilol, amlodipine, hydralazine   Diabetes mellitus type 2 -Continue sliding scale insulin   Peripheral artery disease/left BKA. -Continue Lipitor, aspirin   Depression -Continue sertraline  Hypokalemia: Mild. -Per nephrology.   Goal of care: DNR/DNI.  Difficult to engage  patient for goal of care discussion. -Palliative medicine following  Grief: Lost his brother 2 days prior to this hospitalization. -Appreciate emotional support by chaplain.  Morbid obesity/moderate malnutrition Body mass index is 42.1 kg/m. Nutrition Problem: Moderate Malnutrition Etiology: chronic illness (HF, PAD) Signs/Symptoms: mild fat depletion, mild muscle depletion, edema Interventions: Liberalize Diet, MVI, Nepro shake   DVT prophylaxis:  Place and maintain sequential compression device Start: 08/15/22 0726 SCDs Start: 08/08/22 2241  Code Status: DNR/DNI Family Communication: None at bedside. Level of care: Med-Surg Status is: Inpatient Remains inpatient appropriate because: Renal failure/acute on chronic CHF/pancytopenia   Consultants:  Nephrology Palliative medicine  35 minutes with more than 50% spent in reviewing records, counseling patient/family and coordinating care.   Sch Meds:  Scheduled Meds:  amLODipine  10 mg Oral Daily   aspirin  81 mg Oral Daily   atorvastatin  80 mg Oral QHS   carvedilol  12.5 mg Oral BID WC   Chlorhexidine Gluconate Cloth  6 each Topical Daily   Chlorhexidine Gluconate Cloth  6 each Topical Q0600   Chlorhexidine Gluconate Cloth  6 each Topical Q0600   darbepoetin (ARANESP) injection - NON-DIALYSIS  40 mcg Subcutaneous Q Thu-1800   feeding supplement (NEPRO CARB STEADY)  237 mL Oral BID BM   hydrALAZINE  50 mg Oral Q8H   insulin aspart  0-5 Units Subcutaneous QHS   insulin aspart  0-6 Units Subcutaneous TID WC   multivitamin  1 tablet Oral QHS   mouth rinse  15 mL Mouth Rinse 4 times per day   pantoprazole  40 mg Oral Daily   sertraline  50 mg Oral QODAY   sodium chloride flush  3 mL Intravenous Q12H   tamsulosin  0.4 mg Oral QPC supper   Continuous Infusions: PRN Meds:.acetaminophen **OR** acetaminophen, albuterol, ALPRAZolam, HYDROmorphone (DILAUDID) injection, ondansetron (ZOFRAN) IV, mouth rinse, mouth rinse, white  petrolatum  Antimicrobials: Anti-infectives (From admission, onward)    Start     Dose/Rate Route Frequency Ordered Stop   08/16/22 1147  ceFAZolin (ANCEF) IVPB 2g/100 mL premix        over 30 Minutes Intravenous Continuous PRN 08/16/22 1147 08/16/22 1147        I have personally reviewed the following labs and images: CBC: Recent Labs  Lab 08/13/22 0036 08/14/22 0129 08/15/22 0057 08/16/22 0102 08/16/22 0902 08/16/22 2022 08/17/22 0741  WBC 1.6*   < > 1.4* 1.3* 1.2* 1.3* 1.2*  NEUTROABS 0.6*  --   --   --   --   --   --   HGB 7.4*   < > 7.5* 7.2* 7.3* 7.0* 6.8*  HCT 23.0*   < > 23.6* 22.7* 22.6* 22.3* 21.4*  MCV 88.8   < > 88.4 87.3 88.3 88.8 87.7  PLT 48*   < > 58* 59* 61* 58* 62*   < > = values in this interval not displayed.   BMP &GFR Recent Labs  Lab 08/14/22 0129 08/15/22 0057 08/16/22 0102 08/16/22 2022 08/17/22 0741  NA 135 135 135 134* 136  K 3.8 3.6 3.4* 3.4* 3.3*  CL 101 101  102 100 101  CO2 21* 21* 21* 20* 23  GLUCOSE 122* 166* 122* 146* 133*  BUN 100* 106* 104* 111* 87*  CREATININE 5.55* 5.48* 5.61* 5.93* 4.94*  CALCIUM 7.5* 7.7* 7.7* 7.8* 7.8*  MG 2.0 2.0  --   --  2.1  PHOS 5.5* 5.6* 5.4* 5.8* 4.8*   Estimated Creatinine Clearance: 20.3 mL/min (A) (by C-G formula based on SCr of 4.94 mg/dL (H)). Liver & Pancreas: Recent Labs  Lab 08/14/22 0129 08/15/22 0057 08/16/22 0102 08/16/22 2022 08/17/22 0741  ALBUMIN 2.5* 2.5* 2.5* 2.6* 2.5*   No results for input(s): "LIPASE", "AMYLASE" in the last 168 hours. No results for input(s): "AMMONIA" in the last 168 hours. Diabetic: No results for input(s): "HGBA1C" in the last 72 hours. Recent Labs  Lab 08/16/22 1333 08/16/22 1614 08/16/22 2115 08/17/22 0629 08/17/22 1120  GLUCAP 128* 156* 140* 137* 130*   Cardiac Enzymes: No results for input(s): "CKTOTAL", "CKMB", "CKMBINDEX", "TROPONINI" in the last 168 hours. No results for input(s): "PROBNP" in the last 8760 hours. Coagulation  Profile: No results for input(s): "INR", "PROTIME" in the last 168 hours. Thyroid Function Tests: No results for input(s): "TSH", "T4TOTAL", "FREET4", "T3FREE", "THYROIDAB" in the last 72 hours. Lipid Profile: No results for input(s): "CHOL", "HDL", "LDLCALC", "TRIG", "CHOLHDL", "LDLDIRECT" in the last 72 hours. Anemia Panel: No results for input(s): "VITAMINB12", "FOLATE", "FERRITIN", "TIBC", "IRON", "RETICCTPCT" in the last 72 hours. Urine analysis:    Component Value Date/Time   COLORURINE YELLOW 08/09/2022 0530   APPEARANCEUR CLEAR 08/09/2022 0530   APPEARANCEUR Clear 12/31/2013 2116   LABSPEC 1.025 08/09/2022 0530   LABSPEC 1.032 12/31/2013 2116   PHURINE 5.5 08/09/2022 0530   GLUCOSEU NEGATIVE 08/09/2022 0530   GLUCOSEU >=500 12/31/2013 2116   HGBUR NEGATIVE 08/09/2022 0530   BILIRUBINUR NEGATIVE 08/09/2022 0530   BILIRUBINUR Negative 12/31/2013 2116   KETONESUR NEGATIVE 08/09/2022 0530   PROTEINUR 100 (A) 08/09/2022 0530   UROBILINOGEN 1.0 08/28/2013 1709   NITRITE NEGATIVE 08/09/2022 0530   LEUKOCYTESUR NEGATIVE 08/09/2022 0530   LEUKOCYTESUR Negative 12/31/2013 2116   Sepsis Labs: Invalid input(s): "PROCALCITONIN", "LACTICIDVEN"  Microbiology: No results found for this or any previous visit (from the past 240 hour(s)).  Radiology Studies: No results found.    Eliberto Sole T. Tamanika Heiney [facility]  If 7PM-7AM, please contact night-coverage [email or web address] 08/17/2022, 4:10 PM

## 2022-08-17 NOTE — Progress Notes (Signed)
   08/17/22 1650  Vitals  Temp 98.6 F (37 C)  Pulse Rate 71  Resp 15  BP (!) 167/80  SpO2 100 %  Post Treatment  Dialyzer Clearance Clear  Duration of HD Treatment -hour(s) 2.5 hour(s)  Hemodialysis Intake (mL) 0 mL  Liters Processed 37.5  Fluid Removed (mL) 1500 mL  Tolerated HD Treatment Yes   Received patient in bed to unit.  Alert and oriented.  Informed consent signed and in chart.   TX duration:2.5hrs  Patient tolerated well.  Transported back to the room  Alert, without acute distress.  Hand-off given to patient's nurse.   Access used: [facility] Access issues: none  Total UF removed: 1.5L Medication(s) given: none    [facility]

## 2022-08-17 NOTE — Progress Notes (Signed)
[facility] PROGRESS NOTE  Assessment/ Plan:  # 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  Patient has had multiple consultation with palliative care and discussed goals of care.  It seems like it was decided to pursue dialysis as per patient interest and monitor.  Based on the current functional status, deconditioning and challenges with mobility I am worried that he may not be a good candidate for outpatient dialysis.  He is nonoliguric but certainly no sign of renal recovery.   -s/p RIJ TDC placed by IR followed by dialysis on 6/21. -He tolerated dialysis well.  Plan for second HD today. -Renal navigator was contacted to arrange outpatient dialysis.  However, we need to make sure that he can tolerate HD on chair before discharge.  # Acute urinary retention: On Flomax and Foley catheter.  # Pancytopenia/anemia: I recommend hematology/oncology consult for further evaluation.  Iron saturation 32% with high ferritin.  Started erythropoietin.  Transfusion as needed.  # Acute on chronic diastolic CHF: Treated with IV diuretics.  Now managing volume with HD.  # HTN/volume: Continue current antihypertensives, managing volume with HD.  # Metabolic acidosis: Managed with dialysis, discontinued sodium bicarbonate.  Subjective: Seen and examined at bedside.  First HD yesterday, tolerated well.  He reports chronic tired and fatigue.  Not willing to work with physical therapy this morning.  Plan for second HD today.  Objective Vital signs in last 24 hours: Vitals:   08/17/22 0222 08/17/22 0254 08/17/22 0545 08/17/22 0900  BP: (!) 163/81 (!) 153/83 (!) 156/91 (!) 163/93  Pulse: 69 71 66 67  Resp: 13 14 12 14   Temp: 98.7 F (37.1 C) 98.7 F (37.1 C) 97.8 F (36.6 C) (!) 97.5 F (36.4 C)  TempSrc: Oral Oral Oral Oral  SpO2: 100%  98% 100%  Weight:      Height:       Weight change:   Intake/Output Summary (Last 24 hours)  at 08/17/2022 0956 Last data filed at 08/17/2022 0600 Gross per 24 hour  Intake 200 ml  Output 800 ml  Net -600 ml        Labs: RENAL PANEL Recent Labs  Lab 08/14/22 0129 08/15/22 0057 08/16/22 0102 08/16/22 2022 08/17/22 0741  NA 135 135 135 134* 136  K 3.8 3.6 3.4* 3.4* 3.3*  CL 101 101 102 100 101  CO2 21* 21* 21* 20* 23  GLUCOSE 122* 166* 122* 146* 133*  BUN 100* 106* 104* 111* 87*  CREATININE 5.55* 5.48* 5.61* 5.93* 4.94*  CALCIUM 7.5* 7.7* 7.7* 7.8* 7.8*  MG 2.0 2.0  --   --   --   PHOS 5.5* 5.6* 5.4* 5.8* 4.8*  ALBUMIN 2.5* 2.5* 2.5* 2.6* 2.5*     Liver Function Tests: Recent Labs  Lab 08/16/22 0102 08/16/22 2022 08/17/22 0741  ALBUMIN 2.5* 2.6* 2.5*    No results for input(s): "LIPASE", "AMYLASE" in the last 168 hours. No results for input(s): "AMMONIA" in the last 168 hours. CBC: Recent Labs    10/20/21 0449 10/21/21 0234 03/19/22 0754 03/20/22 1116 08/08/22 2028 08/09/22 0532 08/15/22 0057 08/16/22 0102 08/16/22 0902 08/16/22 2022 08/17/22 0741  HGB  --    < > 7.5*   < > 6.9*   < > 7.5* 7.2* 7.3* 7.0* 6.8*  MCV  --    < > 83.8   < > 90.2   < > 88.4 87.3 88.3 88.8 87.7  VITAMINB12  439  --  321  --  1,139*  --   --   --   --   --   --   FOLATE 6.9  --  9.3  --  9.1  --   --   --   --   --   --   FERRITIN 164  --  235  --  628*  --   --   --   --   --   --   TIBC 227*  --  220*  --  228*  --   --   --   --   --   --   IRON 12*  --  26*  --  72  --   --   --   --   --   --   RETICCTPCT 2.6  --  1.5  --  1.0  --   --   --   --   --   --    < > = values in this interval not displayed.     Cardiac Enzymes: No results for input(s): "CKTOTAL", "CKMB", "CKMBINDEX", "TROPONINI" in the last 168 hours. CBG: Recent Labs  Lab 08/16/22 0618 08/16/22 1333 08/16/22 1614 08/16/22 2115 08/17/22 0629  GLUCAP 97 128* 156* 140* 137*     Iron Studies: No results for input(s): "IRON", "TIBC", "TRANSFERRIN", "FERRITIN" in the last 72  hours. Studies/Results: IR Fluoro Guide CV Line Right  Result Date: 08/16/2022 INDICATION: End-stage renal disease. In need of durable intravenous access for the initiation hemodialysis. EXAM: TUNNELED CENTRAL VENOUS HEMODIALYSIS CATHETER PLACEMENT WITH ULTRASOUND AND FLUOROSCOPIC GUIDANCE MEDICATIONS: Patient is currently admitted to the hospital receiving intravenous antibiotics. The antibiotic was given in an appropriate time interval prior to skin puncture. ANESTHESIA/SEDATION: Moderate (conscious) sedation was employed during this procedure. A total of Versed 1.5 mg and Fentanyl 50 mcg was administered intravenously. Moderate Sedation Time: 11 minutes. The patient's level of consciousness and vital signs were monitored continuously by radiology nursing throughout the procedure under my direct supervision. FLUOROSCOPY TIME:  36 seconds (11 mGy) COMPLICATIONS: None immediate. PROCEDURE: Informed written consent was obtained from the patient after a discussion of the risks, benefits, and alternatives to treatment. Questions regarding the procedure were encouraged and answered. The right neck and chest were prepped with chlorhexidine in a sterile fashion, and a sterile drape was applied covering the operative field. Maximum barrier sterile technique with sterile gowns and gloves were used for the procedure. A timeout was performed prior to the initiation of the procedure. After creating a small venotomy incision, a micropuncture kit was utilized to access the internal jugular vein. Real-time ultrasound guidance was utilized for vascular access including the acquisition of a permanent ultrasound image documenting patency of the accessed vessel. The microwire was utilized to measure appropriate catheter length. A stiff Glidewire was advanced to the level of the IVC and the micropuncture sheath was exchanged for a peel-away sheath. A palindrome tunneled hemodialysis catheter measuring 23 cm from tip to cuff was  tunneled in a retrograde fashion from the anterior chest wall to the venotomy incision. The catheter was then placed through the peel-away sheath with tips ultimately positioned within the superior aspect of the right atrium. Final catheter positioning was confirmed and documented with a spot radiographic image. The catheter aspirates and flushes normally. The catheter was flushed with appropriate volume heparin dwells. The catheter exit site was secured with a 0-Prolene retention suture. The venotomy incision was closed  with Dermabond and Steri-strips. Dressings were applied. The patient tolerated the procedure well without immediate post procedural complication. IMPRESSION: Successful placement of 23 cm tip to cuff tunneled hemodialysis catheter via the right internal jugular vein with tips terminating within the superior aspect of the right atrium. The catheter is ready for immediate use. Electronically Signed   By: Simonne Come M.D.   On: 08/16/2022 16:37   IR US Guide Vasc Access Right  Result Date: 08/16/2022 INDICATION: End-stage renal disease. In need of durable intravenous access for the initiation hemodialysis. EXAM: TUNNELED CENTRAL VENOUS HEMODIALYSIS CATHETER PLACEMENT WITH ULTRASOUND AND FLUOROSCOPIC GUIDANCE MEDICATIONS: Patient is currently admitted to the hospital receiving intravenous antibiotics. The antibiotic was given in an appropriate time interval prior to skin puncture. ANESTHESIA/SEDATION: Moderate (conscious) sedation was employed during this procedure. A total of Versed 1.5 mg and Fentanyl 50 mcg was administered intravenously. Moderate Sedation Time: 11 minutes. The patient's level of consciousness and vital signs were monitored continuously by radiology nursing throughout the procedure under my direct supervision. FLUOROSCOPY TIME:  36 seconds (11 mGy) COMPLICATIONS: None immediate. PROCEDURE: Informed written consent was obtained from the patient after a discussion of the risks,  benefits, and alternatives to treatment. Questions regarding the procedure were encouraged and answered. The right neck and chest were prepped with chlorhexidine in a sterile fashion, and a sterile drape was applied covering the operative field. Maximum barrier sterile technique with sterile gowns and gloves were used for the procedure. A timeout was performed prior to the initiation of the procedure. After creating a small venotomy incision, a micropuncture kit was utilized to access the internal jugular vein. Real-time ultrasound guidance was utilized for vascular access including the acquisition of a permanent ultrasound image documenting patency of the accessed vessel. The microwire was utilized to measure appropriate catheter length. A stiff Glidewire was advanced to the level of the IVC and the micropuncture sheath was exchanged for a peel-away sheath. A palindrome tunneled hemodialysis catheter measuring 23 cm from tip to cuff was tunneled in a retrograde fashion from the anterior chest wall to the venotomy incision. The catheter was then placed through the peel-away sheath with tips ultimately positioned within the superior aspect of the right atrium. Final catheter positioning was confirmed and documented with a spot radiographic image. The catheter aspirates and flushes normally. The catheter was flushed with appropriate volume heparin dwells. The catheter exit site was secured with a 0-Prolene retention suture. The venotomy incision was closed with Dermabond and Steri-strips. Dressings were applied. The patient tolerated the procedure well without immediate post procedural complication. IMPRESSION: Successful placement of 23 cm tip to cuff tunneled hemodialysis catheter via the right internal jugular vein with tips terminating within the superior aspect of the right atrium. The catheter is ready for immediate use. Electronically Signed   By: Simonne Come M.D.   On: 08/16/2022 16:37     Medications: Infusions:   Scheduled Medications:  amLODipine  10 mg Oral Daily   aspirin  81 mg Oral Daily   atorvastatin  80 mg Oral QHS   carvedilol  12.5 mg Oral BID WC   Chlorhexidine Gluconate Cloth  6 each Topical Daily   Chlorhexidine Gluconate Cloth  6 each Topical Q0600   Chlorhexidine Gluconate Cloth  6 each Topical Q0600   darbepoetin (ARANESP) injection - NON-DIALYSIS  40 mcg Subcutaneous Q Thu-1800   feeding supplement (NEPRO CARB STEADY)  237 mL Oral BID BM   hydrALAZINE  50 mg Oral Q8H  insulin aspart  0-5 Units Subcutaneous QHS   insulin aspart  0-6 Units Subcutaneous TID WC   multivitamin  1 tablet Oral QHS   mouth rinse  15 mL Mouth Rinse 4 times per day   pantoprazole  40 mg Oral Daily   sertraline  50 mg Oral QODAY   sodium chloride flush  3 mL Intravenous Q12H   tamsulosin  0.4 mg Oral QPC supper    have reviewed scheduled and prn medications.  Physical Exam: General:NAD, comfortable Heart:RRR, s1s2 nl Lungs: Basal decreased breath sound.   Abdomen:soft, Non-tender, non-distended Extremities: Bilateral lower extremities has chronic edema and skin changes Dialysis Access: Right IJ TDC in place.  [name] 08/17/2022,9:56 AM  LOS: 9 days

## 2022-08-18 DIAGNOSIS — E1122 Type 2 diabetes mellitus with diabetic chronic kidney disease: Secondary | ICD-10-CM | POA: Diagnosis not present

## 2022-08-18 DIAGNOSIS — Z992 Dependence on renal dialysis: Secondary | ICD-10-CM

## 2022-08-18 DIAGNOSIS — D61818 Other pancytopenia: Secondary | ICD-10-CM | POA: Diagnosis not present

## 2022-08-18 DIAGNOSIS — N186 End stage renal disease: Secondary | ICD-10-CM | POA: Diagnosis not present

## 2022-08-18 DIAGNOSIS — I5033 Acute on chronic diastolic (congestive) heart failure: Secondary | ICD-10-CM | POA: Diagnosis not present

## 2022-08-18 DIAGNOSIS — I10 Essential (primary) hypertension: Secondary | ICD-10-CM | POA: Diagnosis not present

## 2022-08-18 DIAGNOSIS — Z8673 Personal history of transient ischemic attack (TIA), and cerebral infarction without residual deficits: Secondary | ICD-10-CM | POA: Diagnosis not present

## 2022-08-18 LAB — CBC
HCT: 20.8 % — ABNORMAL LOW (ref 39.0–52.0)
Hemoglobin: 6.7 g/dL — CL (ref 13.0–17.0)
MCH: 27.8 pg (ref 26.0–34.0)
MCHC: 32.2 g/dL (ref 30.0–36.0)
MCV: 86.3 fL (ref 80.0–100.0)
Platelets: 62 10*3/uL — ABNORMAL LOW (ref 150–400)
RBC: 2.41 MIL/uL — ABNORMAL LOW (ref 4.22–5.81)
RDW: 17.2 % — ABNORMAL HIGH (ref 11.5–15.5)
WBC: 1.5 10*3/uL — ABNORMAL LOW (ref 4.0–10.5)
nRBC: 0 % (ref 0.0–0.2)

## 2022-08-18 LAB — RENAL FUNCTION PANEL
Albumin: 2.5 g/dL — ABNORMAL LOW (ref 3.5–5.0)
Anion gap: 13 (ref 5–15)
BUN: 65 mg/dL — ABNORMAL HIGH (ref 8–23)
CO2: 23 mmol/L (ref 22–32)
Calcium: 7.8 mg/dL — ABNORMAL LOW (ref 8.9–10.3)
Chloride: 100 mmol/L (ref 98–111)
Creatinine, Ser: 4.26 mg/dL — ABNORMAL HIGH (ref 0.61–1.24)
GFR, Estimated: 15 mL/min — ABNORMAL LOW (ref >60)
Glucose, Bld: 145 mg/dL — ABNORMAL HIGH (ref 70–99)
Phosphorus: 4.5 mg/dL (ref 2.5–4.6)
Potassium: 3.2 mmol/L — ABNORMAL LOW (ref 3.5–5.1)
Sodium: 136 mmol/L (ref 135–145)

## 2022-08-18 LAB — TYPE AND SCREEN

## 2022-08-18 LAB — GLUCOSE, CAPILLARY
Glucose-Capillary: 138 mg/dL — ABNORMAL HIGH (ref 70–99)
Glucose-Capillary: 143 mg/dL — ABNORMAL HIGH (ref 70–99)
Glucose-Capillary: 152 mg/dL — ABNORMAL HIGH (ref 70–99)
Glucose-Capillary: 174 mg/dL — ABNORMAL HIGH (ref 70–99)

## 2022-08-18 LAB — MAGNESIUM: Magnesium: 1.9 mg/dL (ref 1.7–2.4)

## 2022-08-18 LAB — PREPARE RBC (CROSSMATCH)

## 2022-08-18 IMAGING — DX DG ABD PORTABLE 1V
2 series · 2 of 2 positions shown · non-contrast
Comparison: 12/07/2020

CLINICAL DATA: Abdominal distension.

EXAM:
PORTABLE ABDOMEN - 1 VIEW

[abdomen kub (1 of 2)]
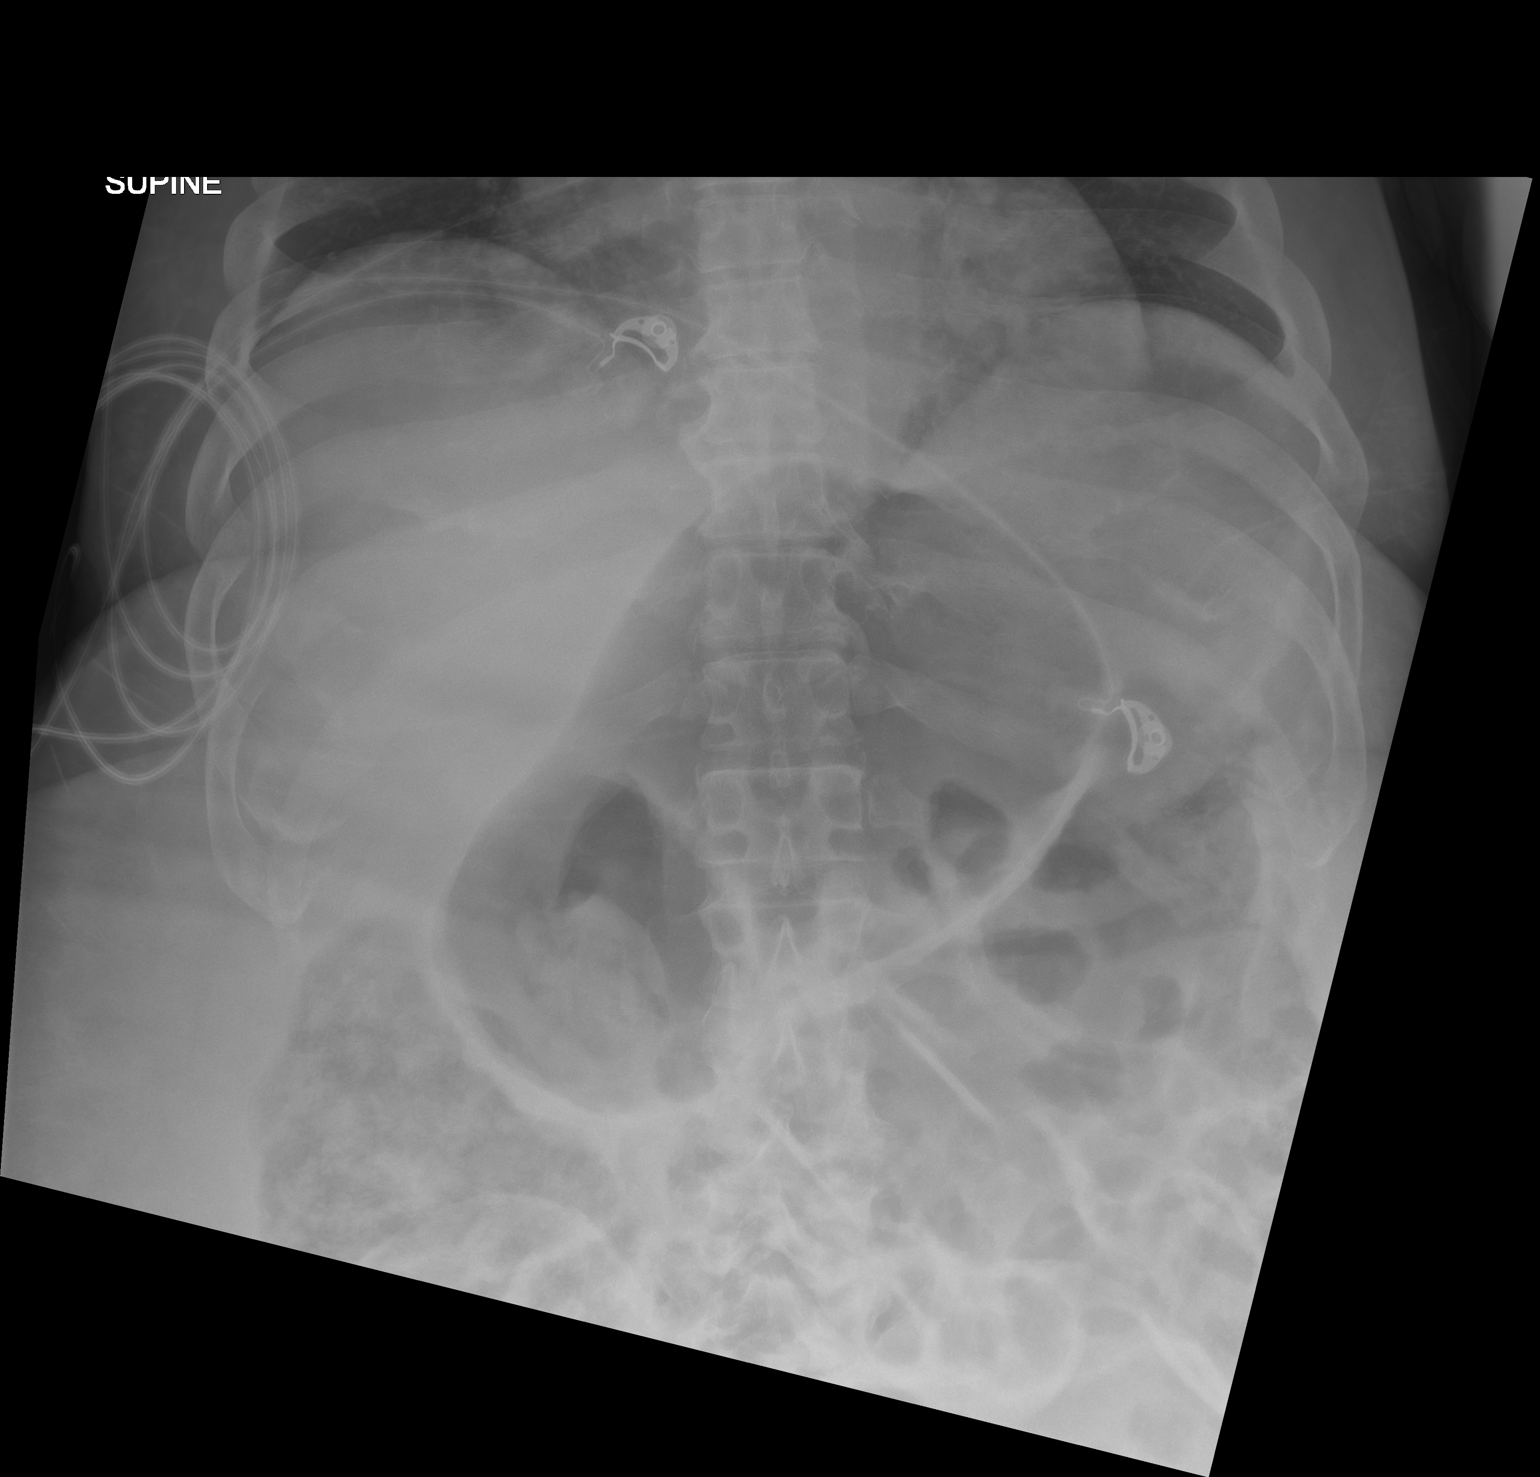

[abdomen kub (2 of 2)]
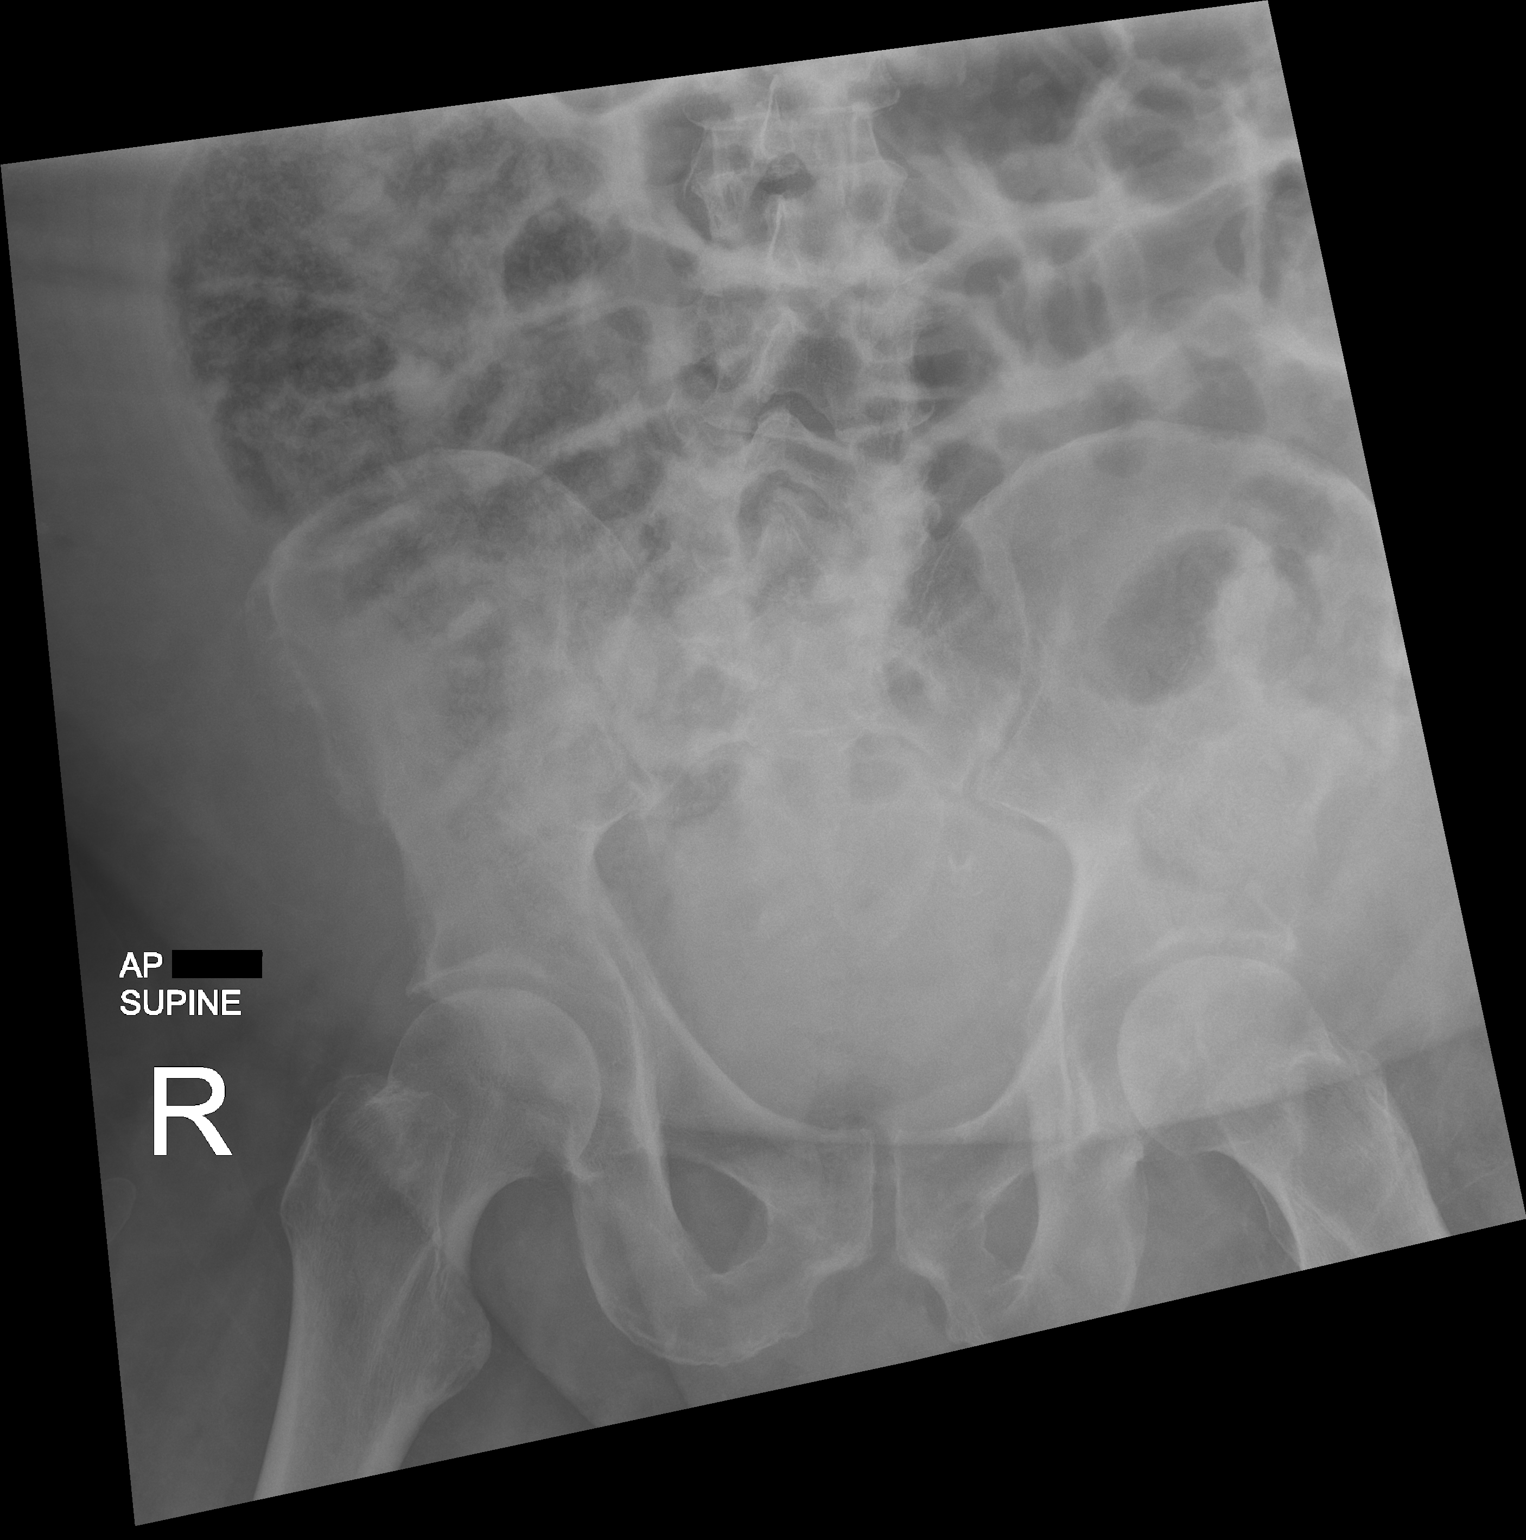

[2 of 2 positions shown; findings below may reference images not displayed]

FINDINGS: There is increased, mild-to-moderate gaseous distension of the
stomach. There is mild gaseous distension of the transverse colon
which has further mildly improved. Gas is present throughout other
loops of nondilated small and large bowel throughout the abdomen.
The visualized lung bases are grossly clear. No acute osseous
abnormality is seen.
IMPRESSION: Increased gaseous distension of the stomach. Mildly decreased
gaseous distension of the transverse colon.

## 2022-08-18 MED ORDER — DARBEPOETIN ALFA 100 MCG/0.5ML IJ SOSY: 100.0000 ug | PREFILLED_SYRINGE | INTRAMUSCULAR | Status: DC

## 2022-08-18 MED ORDER — SODIUM CHLORIDE 0.9% IV SOLUTION: Freq: Once | INTRAVENOUS | Status: AC

## 2022-08-18 MED ORDER — POTASSIUM CHLORIDE CRYS ER 20 MEQ PO TBCR
40.0000 meq | EXTENDED_RELEASE_TABLET | Freq: Once | ORAL | Status: AC
  Administered 2022-08-18: 40 meq via ORAL
  Filled 2022-08-18: qty 2

## 2022-08-18 MED ORDER — CHLORHEXIDINE GLUCONATE CLOTH 2 % EX PADS
6.0000 | MEDICATED_PAD | Freq: Every day | CUTANEOUS | Status: DC
  Administered 2022-08-18 – 2022-09-04 (×14): 6 via TOPICAL

## 2022-08-18 NOTE — Consult Note (Signed)
[facility] CONSULT NOTE  Patient Care Team: [name], MD as PCP - General (Internal Medicine)  ASSESSMENT & PLAN:  Progressive pancytopenia Cause is unknown Ultimately, I recommend bone marrow aspirate and biopsy 10 days ago, his serum vitamin B12, iron studies and folate were within normal range Acute renal failure could certainly explain his severe anemia but would not explain the cause of his leukopenia and thrombocytopenia  I discussed the risk, benefits, side effects of bone marrow aspirate and biopsy and he agrees to proceed after discussion with his sister I will return once it is done with test results available  All questions were answered. The patient knows to call the clinic with any problems, questions or concerns.  The total time spent in the appointment was 80 minutes encounter with patients including review of chart and various tests results, discussions about plan of care and coordination of care plan  Artis Delay, MD 6/23/20242:25 PM   CHIEF COMPLAINTS/PURPOSE OF CONSULTATION:  Pancytopenia  HISTORY OF PRESENTING ILLNESS:  [name] 62 y.o. male is seen at the request by primary service for evaluation of pancytopenia This patient has significant multiple medical comorbidities He has recurrent admissions to the hospital severe pancytopenia, positive fecal occult blood and acute on chronic renal failure.  Patient have history of volvulus/megacolon.  Last colonoscopy was in October 2022 With this admission to the hospital, he was noted to have severe pancytopenia with white count of 1.9, hemoglobin of 6.9 and platelet count of 57 On April 02, 2022, at baseline, his white blood cell count is 3.8, hemoglobin 8.1 and platelet count is 234 According to outside labs dated 07/04/2022, his white count is 2.3, hemoglobin 7.6 and platelet count was 121 He was not evaluated by hematologist with recent hospitalization The patient complained of  excessive fatigue He denies visible bleeding He has recurrent hospitalizations for infection.  He denies alcohol intake. With progressive renal failure, ultimately, the patient is started on hemodialysis  I was not able to get much history from the patient as he started an argument with his sister over the phone during the course of the conversation regarding the risk and benefits of bone marrow biopsy  Past medical history, past surgical history, social history, medication list and others were obtained from chart review   MEDICAL HISTORY:  Past Medical History:  Diagnosis Date   Acute on chronic diastolic CHF (congestive heart failure) (HCC) 08/22/2020   Acute on chronic heart failure with preserved ejection fraction (HCC)    Acute renal failure superimposed on stage 3a chronic kidney disease (HCC) 11/18/2020   Arthritis    Bacteremia due to Enterococcus 12/09/2021   Bipolar affective (HCC)    CHF (congestive heart failure) (HCC)    Claustrophobia    Severe   Cocaine abuse (HCC) 05/31/2014   Cocaine dependence, uncomplicated (HCC) 05/31/2014   Depression    Diabetes mellitus    uncontrolled    Elevated CK    Heart murmur    Hypertension    Ileus (HCC) 11/18/2020   Metabolic acidosis 10/23/2021   Non-traumatic rhabdomyolysis 01/01/2020   Pericardial effusion 08/22/2020   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   Suicidal ideations 08/28/2013    SURGICAL HISTORY: Past Surgical History:  Procedure Laterality Date   ABDOMINAL AORTOGRAM W/LOWER EXTREMITY Left 03/02/2021   Procedure: ABDOMINAL AORTOGRAM W/LOWER EXTREMITY;  Surgeon: Leonie Douglas, MD;  Location: [facility]VAS[facility];  Service: Cardiovascular;  Laterality: Left;  AMPUTATION Left 03/07/2021   Procedure: LEFT BELOW KNEE AMPUTATION;  Surgeon: Leonie Douglas, MD;  Location: [facility] OR;  Service: Vascular;  Laterality: Left;   APPLICATION OF WOUND VAC Left 03/07/2021   Procedure:  APPLICATION OF WOUND VAC;  Surgeon: Leonie Douglas, MD;  Location: [facility] OR;  Service: Vascular;  Laterality: Left;   BOWEL DECOMPRESSION N/A 01/19/2020   Procedure: BOWEL DECOMPRESSION;  Surgeon: Kathi Der, MD;  Location: [facility];  Service: Gastroenterology;  Laterality: N/A;   BOWEL DECOMPRESSION N/A 11/19/2020   Procedure: BOWEL DECOMPRESSION;  Surgeon: Charlott Rakes, MD;  Location: [facility];  Service: Endoscopy;  Laterality: N/A;   BOWEL DECOMPRESSION N/A 12/03/2020   Procedure: BOWEL DECOMPRESSION;  Surgeon: Kerin Salen, MD;  Location: [facility];  Service: Gastroenterology;  Laterality: N/A;   COLONOSCOPY WITH PROPOFOL N/A 01/19/2020   Procedure: COLONOSCOPY WITH PROPOFOL;  Surgeon: Kathi Der, MD;  Location: [facility];  Service: Gastroenterology;  Laterality: N/A;   COLONOSCOPY WITH PROPOFOL N/A 11/19/2020   Procedure: COLONOSCOPY WITH PROPOFOL;  Surgeon: Charlott Rakes, MD;  Location: [facility];  Service: Endoscopy;  Laterality: N/A;   COLONOSCOPY WITH PROPOFOL N/A 12/03/2020   Procedure: COLONOSCOPY WITH PROPOFOL;  Surgeon: Kerin Salen, MD;  Location: [facility];  Service: Gastroenterology;  Laterality: N/A;   IR FLUORO GUIDE CV LINE RIGHT  08/16/2022   IR US GUIDE VASC ACCESS RIGHT  08/16/2022   KNEE SURGERY     b/l knees; pending total knee    RADIOLOGY WITH ANESTHESIA N/A 07/04/2020   Procedure: MRI WITH ANESTHESIA BRAIN WITHOUT CONTRAST AND HEAD WITHOUT CONTRAST;  Surgeon: Radiologist, Medication, MD;  Location: [facility] OR;  Service: Radiology;  Laterality: N/A;   TRANSMETATARSAL AMPUTATION Left 03/01/2021   Procedure: TRANSMETATARSAL AMPUTATION;  Surgeon: Vivi Barrack, DPM;  Location: [facility] OR;  Service: Podiatry;  Laterality: Left;    SOCIAL HISTORY: Social History   Socioeconomic History   Marital status: Single    Spouse name: Not on file   Number of children: Not on file   Years of education: Not on file   Highest education level: Not on  file  Occupational History   Not on file  Tobacco Use   Smoking status: Former   Smokeless tobacco: Never  Vaping Use   Vaping Use: Never used  Substance and Sexual Activity   Alcohol use: No   Drug use: Not Currently    Types: Cocaine    Comment: Last use 2019   Sexual activity: Yes    Birth control/protection: Condom  Other Topics Concern   Not on file  Social History Narrative   Moved from [place]   Denies smoking, drinking   +cocaine history (denies recent use)   Originally from [place]    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[employer]   No kids, married                      Social Determinants of Corporate investment banker Strain: Not on file  Food Insecurity: No Food Insecurity (07/18/2022)   Hunger Vital Sign    Worried About Running Out of Food in the Last Year: Never true    Ran Out of Food in the Last Year: Never true  Transportation Needs: No Transportation Needs (07/18/2022)   PRAPARE - Administrator, Civil Service (Medical): No    Lack of Transportation (Non-Medical): No  Physical Activity: Not on file  Stress:  Not on file  Social Connections: Not on file  Intimate Partner Violence: Not At Risk (07/18/2022)   Humiliation, Afraid, Rape, and Kick questionnaire    Fear of Current or Ex-Partner: No    Emotionally Abused: No    Physically Abused: No    Sexually Abused: No    FAMILY HISTORY: Family History  Problem Relation Age of Onset   Diabetes Other    Cancer Mother        uterus; deceased    Heart attack Mother 75   Hypertension Other    Heart attack Father 46   Stroke Sister        12/2012 age 49 y.o    ALLERGIES:  is allergic to other and sertraline.  MEDICATIONS:  Current Facility-Administered Medications  Medication Dose Route Frequency Provider Last Rate Last Admin   acetaminophen (TYLENOL) tablet 650 mg  650 mg Oral Q6H PRN Opyd, Lavone Neri, MD   650 mg at 08/18/22 0245   Or    acetaminophen (TYLENOL) suppository 650 mg  650 mg Rectal Q6H PRN Opyd, Lavone Neri, MD       albuterol (PROVENTIL) (2.5 MG/3ML) 0.083% nebulizer solution 3 mL  3 mL Inhalation Q6H PRN Opyd, Lavone Neri, MD       ALPRAZolam Prudy Feeler) tablet 0.5 mg  0.5 mg Oral TID PRN Coralie Keens, MD   0.5 mg at 08/18/22 0245   amLODipine (NORVASC) tablet 10 mg  10 mg Oral Daily Opyd, Lavone Neri, MD   10 mg at 08/18/22 1015   aspirin chewable tablet 81 mg  81 mg Oral Daily Opyd, Lavone Neri, MD   81 mg at 08/18/22 1015   atorvastatin (LIPITOR) tablet 80 mg  80 mg Oral QHS Opyd, Lavone Neri, MD   80 mg at 08/17/22 2106   carvedilol (COREG) tablet 12.5 mg  12.5 mg Oral BID WC Opyd, Lavone Neri, MD   12.5 mg at 08/18/22 1015   Chlorhexidine Gluconate Cloth 2 % PADS 6 each  6 each Topical Q0600 Maxie Barb, MD       [START ON 08/22/2022] Darbepoetin Alfa (ARANESP) injection 100 mcg  100 mcg Subcutaneous Q Thu-1800 Maxie Barb, MD       feeding supplement (NEPRO CARB STEADY) liquid 237 mL  237 mL Oral BID BM Gonfa, Taye T, MD   237 mL at 08/18/22 1017   hydrALAZINE (APRESOLINE) tablet 50 mg  50 mg Oral Q8H Opyd, Lavone Neri, MD   50 mg at 08/18/22 1423   HYDROmorphone (DILAUDID) injection 0.5 mg  0.5 mg Intravenous Q4H PRN Opyd, Lavone Neri, MD   0.5 mg at 08/09/22 1117   insulin aspart (novoLOG) injection 0-5 Units  0-5 Units Subcutaneous QHS Briscoe Deutscher, MD   4 Units at 08/13/22 2102   insulin aspart (novoLOG) injection 0-6 Units  0-6 Units Subcutaneous TID WC Opyd, Lavone Neri, MD   1 Units at 08/14/22 1121   multivitamin (RENA-VIT) tablet 1 tablet  1 tablet Oral QHS Candelaria Stagers T, MD   1 tablet at 08/17/22 2106   ondansetron (ZOFRAN) injection 4 mg  4 mg Intravenous Q6H PRN Carollee Herter, DO   4 mg at 08/18/22 0206   Oral care mouth rinse  15 mL Mouth Rinse PRN Arrien, Roy Ram, MD       Oral care mouth rinse  15 mL Mouth Rinse 4 times per day Candelaria Stagers T, MD   15 mL at 08/18/22 1424   Oral  care mouth rinse  15 mL Mouth Rinse PRN Almon Hercules, MD       pantoprazole (PROTONIX) EC tablet 40 mg  40 mg Oral Daily Arrien, Roy Ram, MD   40 mg at 08/18/22 1015   sertraline (ZOLOFT) tablet 50 mg  50 mg Oral QODAY Opyd, Lavone Neri, MD   50 mg at 08/17/22 1054   sodium chloride flush (NS) 0.9 % injection 3 mL  3 mL Intravenous Q12H Opyd, Lavone Neri, MD   3 mL at 08/18/22 1034   tamsulosin (FLOMAX) capsule 0.4 mg  0.4 mg Oral QPC supper Estanislado Emms, MD   0.4 mg at 08/17/22 1801   white petrolatum (VASELINE) gel   Topical PRN Arrien, Roy Ram, MD   0.2 Application at 08/09/22 1242    REVIEW OF SYSTEMS:  unable to obtained   PHYSICAL EXAMINATION: ECOG PERFORMANCE STATUS: 4 - Bedbound  Vitals:   08/18/22 1032 08/18/22 1422  BP: (!) 150/79 131/73  Pulse: 69 70  Resp: 17   Temp: 97.8 F (36.6 C) 98.5 F (36.9 C)  SpO2: 100% 100%   Filed Weights   08/15/22 0438 08/16/22 0441  Weight: 287 lb 11.2 oz (130.5 kg) 276 lb 14.4 oz (125.6 kg)    GENERAL drowsy but rousable SKIN: skin color, texture, turgor are normal, no rashes or significant lesions EYES: normal, conjunctiva are pale and non-injected, sclera clear OROPHARYNX:poor oral hygiene, missing teeth NECK: supple, thyroid normal size, non-tender, without nodularity LYMPH:  no palpable lymphadenopathy in the cervical, axillary or inguinal LUNGS: clear to auscultation and percussion with normal breathing effort HEART: regular rate & rhythm and no murmurs and no lower extremity edema ABDOMEN:abdomen is distended Musculoskeletal:Left BKA, diffuse edema right lower extremity PSYCH: appears sedated NEURO: no focal motor/sensory deficits, very sedated  RADIOGRAPHIC STUDIES:I have reviewed some of his prior CT imaging I have personally reviewed the radiological images as listed and agreed with the findings in the report. IR Fluoro Guide CV Line Right  Result Date: 08/16/2022 INDICATION: End-stage renal disease. In  need of durable intravenous access for the initiation hemodialysis. EXAM: TUNNELED CENTRAL VENOUS HEMODIALYSIS CATHETER PLACEMENT WITH ULTRASOUND AND FLUOROSCOPIC GUIDANCE MEDICATIONS: Patient is currently admitted to the hospital receiving intravenous antibiotics. The antibiotic was given in an appropriate time interval prior to skin puncture. ANESTHESIA/SEDATION: Moderate (conscious) sedation was employed during this procedure. A total of Versed 1.5 mg and Fentanyl 50 mcg was administered intravenously. Moderate Sedation Time: 11 minutes. The patient's level of consciousness and vital signs were monitored continuously by radiology nursing throughout the procedure under my direct supervision. FLUOROSCOPY TIME:  36 seconds (11 mGy) COMPLICATIONS: None immediate. PROCEDURE: Informed written consent was obtained from the patient after a discussion of the risks, benefits, and alternatives to treatment. Questions regarding the procedure were encouraged and answered. The right neck and chest were prepped with chlorhexidine in a sterile fashion, and a sterile drape was applied covering the operative field. Maximum barrier sterile technique with sterile gowns and gloves were used for the procedure. A timeout was performed prior to the initiation of the procedure. After creating a small venotomy incision, a micropuncture kit was utilized to access the internal jugular vein. Real-time ultrasound guidance was utilized for vascular access including the acquisition of a permanent ultrasound image documenting patency of the accessed vessel. The microwire was utilized to measure appropriate catheter length. A stiff Glidewire was advanced to the level of the IVC and the micropuncture sheath was exchanged for a  peel-away sheath. A palindrome tunneled hemodialysis catheter measuring 23 cm from tip to cuff was tunneled in a retrograde fashion from the anterior chest wall to the venotomy incision. The catheter was then placed through  the peel-away sheath with tips ultimately positioned within the superior aspect of the right atrium. Final catheter positioning was confirmed and documented with a spot radiographic image. The catheter aspirates and flushes normally. The catheter was flushed with appropriate volume heparin dwells. The catheter exit site was secured with a 0-Prolene retention suture. The venotomy incision was closed with Dermabond and Steri-strips. Dressings were applied. The patient tolerated the procedure well without immediate post procedural complication. IMPRESSION: Successful placement of 23 cm tip to cuff tunneled hemodialysis catheter via the right internal jugular vein with tips terminating within the superior aspect of the right atrium. The catheter is ready for immediate use. Electronically Signed   By: Simonne Come M.D.   On: 08/16/2022 16:37   IR US Guide Vasc Access Right  Result Date: 08/16/2022 INDICATION: End-stage renal disease. In need of durable intravenous access for the initiation hemodialysis. EXAM: TUNNELED CENTRAL VENOUS HEMODIALYSIS CATHETER PLACEMENT WITH ULTRASOUND AND FLUOROSCOPIC GUIDANCE MEDICATIONS: Patient is currently admitted to the hospital receiving intravenous antibiotics. The antibiotic was given in an appropriate time interval prior to skin puncture. ANESTHESIA/SEDATION: Moderate (conscious) sedation was employed during this procedure. A total of Versed 1.5 mg and Fentanyl 50 mcg was administered intravenously. Moderate Sedation Time: 11 minutes. The patient's level of consciousness and vital signs were monitored continuously by radiology nursing throughout the procedure under my direct supervision. FLUOROSCOPY TIME:  36 seconds (11 mGy) COMPLICATIONS: None immediate. PROCEDURE: Informed written consent was obtained from the patient after a discussion of the risks, benefits, and alternatives to treatment. Questions regarding the procedure were encouraged and answered. The right neck and  chest were prepped with chlorhexidine in a sterile fashion, and a sterile drape was applied covering the operative field. Maximum barrier sterile technique with sterile gowns and gloves were used for the procedure. A timeout was performed prior to the initiation of the procedure. After creating a small venotomy incision, a micropuncture kit was utilized to access the internal jugular vein. Real-time ultrasound guidance was utilized for vascular access including the acquisition of a permanent ultrasound image documenting patency of the accessed vessel. The microwire was utilized to measure appropriate catheter length. A stiff Glidewire was advanced to the level of the IVC and the micropuncture sheath was exchanged for a peel-away sheath. A palindrome tunneled hemodialysis catheter measuring 23 cm from tip to cuff was tunneled in a retrograde fashion from the anterior chest wall to the venotomy incision. The catheter was then placed through the peel-away sheath with tips ultimately positioned within the superior aspect of the right atrium. Final catheter positioning was confirmed and documented with a spot radiographic image. The catheter aspirates and flushes normally. The catheter was flushed with appropriate volume heparin dwells. The catheter exit site was secured with a 0-Prolene retention suture. The venotomy incision was closed with Dermabond and Steri-strips. Dressings were applied. The patient tolerated the procedure well without immediate post procedural complication. IMPRESSION: Successful placement of 23 cm tip to cuff tunneled hemodialysis catheter via the right internal jugular vein with tips terminating within the superior aspect of the right atrium. The catheter is ready for immediate use. Electronically Signed   By: Simonne Come M.D.   On: 08/16/2022 16:37   DG Chest 2 View  Result Date: 08/08/2022 CLINICAL DATA:  [id number] with dyspnea. EXAM: CHEST - 2 VIEW COMPARISON:  Portable chest 07/17/2022, CT  chest no contrast 03/17/2022 FINDINGS: The heart is moderately enlarged. Perihilar vascular congestion and edema are again noted. There is patchy airspace disease in the lung fields which could be airspace edema, pneumonia or combination. There are small layering pleural effusions. Mediastinum is normally outlined. There are degenerative changes and mild dextroscoliosis of the thoracic spine. Similar findings were noted on both prior studies. IMPRESSION: 1. Cardiomegaly with perihilar vascular congestion and edema. 2. Patchy airspace disease in the lung fields which could be airspace edema, pneumonia or combination. 3. Small layering pleural effusions. 4. Clinical correlation and radiographic follow-up recommended. Electronically Signed   By: Almira Bar M.D.   On: 08/08/2022 21:18

## 2022-08-18 NOTE — Progress Notes (Signed)
Patient ID: AC COLAN, male   DOB: 1960/10/31, 62 y.o.   MRN: [id number]    Progress Note from the Palliative Medicine Team at [facility]   Patient Name: [name]        Date: 08/18/2022 DOB: 01/15/1961  Age: 62 y.o. MRN#: [id number] Attending Physician: Almon Hercules, MD Primary Care Physician: [name], MD Admit Date: 08/08/2022    Extensive chart review has been completed prior to meeting with patient/family  including labs, vital signs, imaging, progress/consult notes, orders, medications and available advance directive documents.    This NP assessed patient at the bedside as a follow up for palliative medicine needs and emotional support.  62 y.o. male with a history of diabetes mellitus type 2, hypertension, CKD stage IV, PVD/left BKA, CVA, heart failure and pancytopenia.  Admitted with low hemoglobin.  Hospitalization complicated by AKI on C Kd stage IV and acute on chronic CHF.  Patient is faces ongoing decisions regarding life prolonging measures.        08/16/22 - s/p RIJ TDC placed by IR followed by dialysis on 6/21, so far tolerating     08/18/22 -oncology consult for pancytopenia/Dr. Sue Lush is for  bone marrow aspirate for diagnostic purposes              Difficult medical decisions blanketed  by the grief of the death of  his brother last week, funeral was yesterday.  Palliative medicine chaplain/Ellen Janee Morn working with patient   Ongoing conversation today with [name] regarding current medical situation.  Patient's multiple comorbidities were reviewed specific to end-stage renal disease, CHF, and pancytopenia, generalized  weakness and overall failure to thrive.  Concept of human mortality and the limitations of medical interventions to prolong quality of life when the body fails to thrive was explored.   I encouraged [name] to contemplate his personal values and goals of care specific to healthcare decisions.    Patient is high risk  for decompensation regardless of medical interventions.       Education offered today regarding  the importance of continued conversation with his family and their  medical providers regarding overall plan of care and treatment options,  ensuring decisions are within the context of the patients values and GOCs.  Questions and concerns addressed    PMT will continue to support holistically   Time: 50  minutes  Detailed review of medical records ( labs, imaging, vital signs), medically appropriate exam ( MS, skin, resp)   discussed with treatment team, counseling and education to patient, family, staff, documenting clinical information, medication management, coordination of care    Lorinda Creed NP  Palliative Medicine Team Team Phone # [phone] Pager [phone]

## 2022-08-18 NOTE — Progress Notes (Signed)
PT Cancellation Note  Patient Details Name: [name] MRN: [id number] DOB: 01/17/1961   Cancelled Treatment:    Reason Eval/Treat Not Completed: PT screened, no needs identified, will sign off. At baseline, pt requires +2 total assist mobility, hoyer lift transfer bed <> w/c. Pt reports he is at his baseline and declines any PT needs.    Ilda Foil 08/18/2022, 11:05 AM

## 2022-08-18 NOTE — Progress Notes (Signed)
PROGRESS NOTE  [name] ZOX:[id number] DOB: Jan 29, 1961   PCP: [name], MD  Patient is from: SNF.  DOA: 08/08/2022 LOS: 10  Chief complaints Chief Complaint  Patient presents with   abnormal labs      Brief Narrative / Interim history:  62 y.o. male with a history of diabetes mellitus type 2, hypertension, CKD stage IV, PVD/left BKA, CVA, heart failure and pancytopenia.  Patient presented secondary to low hemoglobin.  He was transfused 1 unit with improvement in hemoglobin.  Hospitalization complicated by AKI on CKD-4 and acute on chronic CHF.  Nephrology consulted and started on Lasix drip.  AKI worse.  Initially felt to be not a good candidate for hemodialysis based on his functional status and refusal to HD.  Palliative consultation was recommended by nephrology.  However, he later changed his mind and agreed to have dialysis.  Had [facility] placed and HD started on 6/21.  Nephrology and palliative medicine following.  Oncology consulted for pancytopenia.   Subjective: Seen and examined earlier this morning.  No major events overnight of this morning.  He is sleeping but wakes to voice.  Likes to sleep.  Objective: Vitals:   08/18/22 0035 08/18/22 0543 08/18/22 0742 08/18/22 1032  BP: (!) 164/80 (!) 169/89 (!) 164/80 (!) 150/79  Pulse: 72 65 65 69  Resp: 16 16 17 17   Temp: 98.8 F (37.1 C) 98.3 F (36.8 C) 98.4 F (36.9 C) 97.8 F (36.6 C)  TempSrc: Oral Oral Oral Oral  SpO2: 93% 100% 100% 100%  Weight:      Height:        Examination:  GENERAL: No apparent distress.  Sleepy. HEENT: MMM.  Vision and hearing grossly intact.  NECK: Supple.  No apparent JVD.  RESP:  No IWOB.  Fair aeration bilaterally. CVS:  RRR. Heart sounds normal.  ABD/GI/GU: BS+. Abd soft, NTND.  MSK/EXT: Left BKA.  2+ RUE and RLE edema.  SKIN: RLE skin changes consistent with chronic venous insufficiency. NEURO: Sleepy but wakes to voice.  Was back to sleep. PSYCH: Somewhat flat  affect.  Procedures:  None  Microbiology summarized: None  Assessment and plan: Principal Problem:   Pancytopenia (HCC) Active Problems:   Acute renal failure superimposed on stage 4 chronic kidney disease (HCC)   Acute on chronic heart failure with preserved ejection fraction (HFpEF) (HCC)   Essential hypertension   History of CVA (cerebrovascular accident)   T2DM (type 2 diabetes mellitus) (HCC)   PAD (peripheral artery disease) (HCC)   Depression   Protein-calorie malnutrition, moderate (HCC)  Pancytopenia: Looks like chronic issue.  Anemia panel consistent with anemia of chronic disease.  No overt bleeding.  LDH elevated.  Haptoglobin within normal. -Nephrology started Aranesp and increasing dose. -Hematology consulted. -Transfuse 1 unit   AKI on CKD stage IV/azotemia: Started HD on 6/22. Recent Labs    08/10/22 0031 08/11/22 0049 08/12/22 0121 08/13/22 0036 08/14/22 0129 08/15/22 0057 08/16/22 0102 08/16/22 2022 08/17/22 0741 08/18/22 0748  BUN 78* 90* 88* 97* 100* 106* 104* 111* 87* 65*  CREATININE 5.30* 5.31* 5.41* 5.58* 5.55* 5.48* 5.61* 5.93* 4.94* 4.26*  -Per nephrology -[facility] placed and HD started on 6/21. -Palliative medicine following. -Continue monitoring  Acute metabolic encephalopathy: Seems to have resolved. -Nephrology and palliative medicine following.   Acute on chronic diastolic heart failure: Difficult to manage with worsening renal function. -Fluid management by HD per nephrology  Urinary retention: Foley catheter placed on 6/17. -Continue Foley catheter and Flomax.  Primary hypertension: BP slightly elevated.  Likely due to fluid overload -Anticipate improvement with dialysis -Continue carvedilol, amlodipine, hydralazine   Diabetes mellitus type 2 -Continue sliding scale insulin   Peripheral artery disease/left BKA. -Continue Lipitor, aspirin   Depression -Continue sertraline  Hypokalemia: Mild. -P.o. KCl 40 x 1   Goal of  care: DNR/DNI.  Difficult to engage patient for goal of care discussion. -Palliative medicine following  Grief: Lost his brother 2 days prior to this hospitalization. -Appreciate emotional support by chaplain.  Morbid obesity/moderate malnutrition Body mass index is 42.1 kg/m. Nutrition Problem: Moderate Malnutrition Etiology: chronic illness (HF, PAD) Signs/Symptoms: mild fat depletion, mild muscle depletion, edema Interventions: Liberalize Diet, MVI, Nepro shake   DVT prophylaxis:  Place and maintain sequential compression device Start: 08/15/22 0726 SCDs Start: 08/08/22 2241  Code Status: DNR/DNI Family Communication: None at bedside. Level of care: Med-Surg Status is: Inpatient Remains inpatient appropriate because: Renal failure/acute on chronic CHF/pancytopenia   Consultants:  Nephrology Palliative medicine Hematology/oncology  35 minutes with more than 50% spent in reviewing records, counseling patient/family and coordinating care.   Sch Meds:  Scheduled Meds:  sodium chloride   Intravenous Once   amLODipine  10 mg Oral Daily   aspirin  81 mg Oral Daily   atorvastatin  80 mg Oral QHS   carvedilol  12.5 mg Oral BID WC   Chlorhexidine Gluconate Cloth  6 each Topical Q0600   [START ON 08/22/2022] darbepoetin (ARANESP) injection - NON-DIALYSIS  100 mcg Subcutaneous Q Thu-1800   feeding supplement (NEPRO CARB STEADY)  237 mL Oral BID BM   hydrALAZINE  50 mg Oral Q8H   insulin aspart  0-5 Units Subcutaneous QHS   insulin aspart  0-6 Units Subcutaneous TID WC   multivitamin  1 tablet Oral QHS   mouth rinse  15 mL Mouth Rinse 4 times per day   pantoprazole  40 mg Oral Daily   sertraline  50 mg Oral QODAY   sodium chloride flush  3 mL Intravenous Q12H   tamsulosin  0.4 mg Oral QPC supper   Continuous Infusions: PRN Meds:.acetaminophen **OR** acetaminophen, albuterol, ALPRAZolam, HYDROmorphone (DILAUDID) injection, ondansetron (ZOFRAN) IV, mouth rinse, mouth rinse,  white petrolatum  Antimicrobials: Anti-infectives (From admission, onward)    Start     Dose/Rate Route Frequency Ordered Stop   08/16/22 1147  ceFAZolin (ANCEF) IVPB 2g/100 mL premix        over 30 Minutes Intravenous Continuous PRN 08/16/22 1147 08/16/22 1147        I have personally reviewed the following labs and images: CBC: Recent Labs  Lab 08/13/22 0036 08/14/22 0129 08/16/22 0102 08/16/22 0902 08/16/22 2022 08/17/22 0741 08/18/22 0748  WBC 1.6*   < > 1.3* 1.2* 1.3* 1.2* 1.5*  NEUTROABS 0.6*  --   --   --   --   --   --   HGB 7.4*   < > 7.2* 7.3* 7.0* 6.8* 6.7*  HCT 23.0*   < > 22.7* 22.6* 22.3* 21.4* 20.8*  MCV 88.8   < > 87.3 88.3 88.8 87.7 86.3  PLT 48*   < > 59* 61* 58* 62* 62*   < > = values in this interval not displayed.   BMP &GFR Recent Labs  Lab 08/14/22 0129 08/15/22 0057 08/16/22 0102 08/16/22 2022 08/17/22 0741 08/18/22 0748  NA 135 135 135 134* 136 136  K 3.8 3.6 3.4* 3.4* 3.3* 3.2*  CL 101 101 102 100 101 100  CO2 21*  21* 21* 20* 23 23  GLUCOSE 122* 166* 122* 146* 133* 145*  BUN 100* 106* 104* 111* 87* 65*  CREATININE 5.55* 5.48* 5.61* 5.93* 4.94* 4.26*  CALCIUM 7.5* 7.7* 7.7* 7.8* 7.8* 7.8*  MG 2.0 2.0  --   --  2.1 1.9  PHOS 5.5* 5.6* 5.4* 5.8* 4.8* 4.5   Estimated Creatinine Clearance: 23.5 mL/min (A) (by C-G formula based on SCr of 4.26 mg/dL (H)). Liver & Pancreas: Recent Labs  Lab 08/15/22 0057 08/16/22 0102 08/16/22 2022 08/17/22 0741 08/18/22 0748  ALBUMIN 2.5* 2.5* 2.6* 2.5* 2.5*   No results for input(s): "LIPASE", "AMYLASE" in the last 168 hours. No results for input(s): "AMMONIA" in the last 168 hours. Diabetic: No results for input(s): "HGBA1C" in the last 72 hours. Recent Labs  Lab 08/17/22 1120 08/17/22 1754 08/17/22 2056 08/18/22 0609 08/18/22 1114  GLUCAP 130* 117* 157* 138* 143*   Cardiac Enzymes: No results for input(s): "CKTOTAL", "CKMB", "CKMBINDEX", "TROPONINI" in the last 168 hours. No results  for input(s): "PROBNP" in the last 8760 hours. Coagulation Profile: No results for input(s): "INR", "PROTIME" in the last 168 hours. Thyroid Function Tests: No results for input(s): "TSH", "T4TOTAL", "FREET4", "T3FREE", "THYROIDAB" in the last 72 hours. Lipid Profile: No results for input(s): "CHOL", "HDL", "LDLCALC", "TRIG", "CHOLHDL", "LDLDIRECT" in the last 72 hours. Anemia Panel: No results for input(s): "VITAMINB12", "FOLATE", "FERRITIN", "TIBC", "IRON", "RETICCTPCT" in the last 72 hours. Urine analysis:    Component Value Date/Time   COLORURINE YELLOW 08/09/2022 0530   APPEARANCEUR CLEAR 08/09/2022 0530   APPEARANCEUR Clear 12/31/2013 2116   LABSPEC 1.025 08/09/2022 0530   LABSPEC 1.032 12/31/2013 2116   PHURINE 5.5 08/09/2022 0530   GLUCOSEU NEGATIVE 08/09/2022 0530   GLUCOSEU >=500 12/31/2013 2116   HGBUR NEGATIVE 08/09/2022 0530   BILIRUBINUR NEGATIVE 08/09/2022 0530   BILIRUBINUR Negative 12/31/2013 2116   KETONESUR NEGATIVE 08/09/2022 0530   PROTEINUR 100 (A) 08/09/2022 0530   UROBILINOGEN 1.0 08/28/2013 1709   NITRITE NEGATIVE 08/09/2022 0530   LEUKOCYTESUR NEGATIVE 08/09/2022 0530   LEUKOCYTESUR Negative 12/31/2013 2116   Sepsis Labs: Invalid input(s): "PROCALCITONIN", "LACTICIDVEN"  Microbiology: No results found for this or any previous visit (from the past 240 hour(s)).  Radiology Studies: No results found.    Cong Hightower T. Laasia Arcos [facility]  If 7PM-7AM, please contact night-coverage [email or web address] 08/18/2022, 12:45 PM

## 2022-08-18 NOTE — Progress Notes (Signed)
[facility] NEPHROLOGY PROGRESS NOTE  Assessment/ Plan:  # 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  Patient has had multiple consultation with palliative care and discussed goals of care.  It seems like it was decided to pursue dialysis as per patient interest and monitor.  Based on the current functional status, deconditioning and challenges with mobility I am worried that he may not be a good candidate for outpatient dialysis.  He is nonoliguric but certainly no sign of renal recovery.   -s/p RIJ TDC placed by IR followed by dialysis on 6/21. -He completed second HD early this morning.  1.5 L UF. -Plan for another treatment tomorrow. -Renal navigator was contacted to arrange outpatient dialysis.  However, we need to make sure that he can tolerate HD on chair before discharge.  # Acute urinary retention: On Flomax and Foley catheter.  # Pancytopenia/anemia: I recommend hematology/oncology consult for further evaluation.  Iron saturation 32% with high ferritin.  I will increase the dose of erythropoietin.  Agree with blood transfusion.    # Acute on chronic diastolic CHF: Treated with IV diuretics.  Now managing volume with HD.  # HTN/volume: Continue current antihypertensives, managing volume with HD.  # Metabolic acidosis: Managed with dialysis, discontinued sodium bicarbonate.  # Hypokalemia: Dialyzing with high potassium bath.  Receiving Dilaudid as well.  Subjective: Seen and examined at bedside.  Completed second HD this morning with 1.5 L net UF.  Patient denies any complaint or concern.  He wants to go back to sleep.  Objective Vital signs in last 24 hours: Vitals:   08/17/22 2021 08/18/22 0035 08/18/22 0543 08/18/22 0742  BP: (!) 165/80 (!) 164/80 (!) 169/89 (!) 164/80  Pulse: 71 72 65 65  Resp: 14 16 16 17   Temp: 98.6 F (37 C) 98.8 F (37.1 C) 98.3 F (36.8 C) 98.4 F (36.9 C)  TempSrc: Oral Oral Oral Oral   SpO2: 98% 93% 100% 100%  Weight:      Height:       Weight change:   Intake/Output Summary (Last 24 hours) at 08/18/2022 1032 Last data filed at 08/18/2022 0038 Gross per 24 hour  Intake 150 ml  Output 2150 ml  Net -2000 ml        Labs: RENAL PANEL Recent Labs  Lab 08/14/22 0129 08/15/22 0057 08/16/22 0102 08/16/22 2022 08/17/22 0741 08/18/22 0748  NA 135 135 135 134* 136 136  K 3.8 3.6 3.4* 3.4* 3.3* 3.2*  CL 101 101 102 100 101 100  CO2 21* 21* 21* 20* 23 23  GLUCOSE 122* 166* 122* 146* 133* 145*  BUN 100* 106* 104* 111* 87* 65*  CREATININE 5.55* 5.48* 5.61* 5.93* 4.94* 4.26*  CALCIUM 7.5* 7.7* 7.7* 7.8* 7.8* 7.8*  MG 2.0 2.0  --   --  2.1 1.9  PHOS 5.5* 5.6* 5.4* 5.8* 4.8* 4.5  ALBUMIN 2.5* 2.5* 2.5* 2.6* 2.5* 2.5*     Liver Function Tests: Recent Labs  Lab 08/16/22 2022 08/17/22 0741 08/18/22 0748  ALBUMIN 2.6* 2.5* 2.5*    No results for input(s): "LIPASE", "AMYLASE" in the last 168 hours. No results for input(s): "AMMONIA" in the last 168 hours. CBC: Recent Labs    10/20/21 0449 10/21/21 0234 03/19/22 0754 03/20/22 1116 08/08/22 2028 08/09/22 0532 08/16/22 0102 08/16/22 0902 08/16/22 2022 08/17/22 0741 08/18/22 0748  HGB  --    < > 7.5*   < > 6.9*   < >  7.2* 7.3* 7.0* 6.8* 6.7*  MCV  --    < > 83.8   < > 90.2   < > 87.3 88.3 88.8 87.7 86.3  VITAMINB12 439  --  321  --  1,139*  --   --   --   --   --   --   FOLATE 6.9  --  9.3  --  9.1  --   --   --   --   --   --   FERRITIN 164  --  235  --  628*  --   --   --   --   --   --   TIBC 227*  --  220*  --  228*  --   --   --   --   --   --   IRON 12*  --  26*  --  72  --   --   --   --   --   --   RETICCTPCT 2.6  --  1.5  --  1.0  --   --   --   --   --   --    < > = values in this interval not displayed.     Cardiac Enzymes: No results for input(s): "CKTOTAL", "CKMB", "CKMBINDEX", "TROPONINI" in the last 168 hours. CBG: Recent Labs  Lab 08/17/22 0629 08/17/22 1120 08/17/22 1754  08/17/22 2056 08/18/22 0609  GLUCAP 137* 130* 117* 157* 138*     Iron Studies: No results for input(s): "IRON", "TIBC", "TRANSFERRIN", "FERRITIN" in the last 72 hours. Studies/Results: IR Fluoro Guide CV Line Right  Result Date: 08/16/2022 INDICATION: End-stage renal disease. In need of durable intravenous access for the initiation hemodialysis. EXAM: TUNNELED CENTRAL VENOUS HEMODIALYSIS CATHETER PLACEMENT WITH ULTRASOUND AND FLUOROSCOPIC GUIDANCE MEDICATIONS: Patient is currently admitted to the hospital receiving intravenous antibiotics. The antibiotic was given in an appropriate time interval prior to skin puncture. ANESTHESIA/SEDATION: Moderate (conscious) sedation was employed during this procedure. A total of Versed 1.5 mg and Fentanyl 50 mcg was administered intravenously. Moderate Sedation Time: 11 minutes. The patient's level of consciousness and vital signs were monitored continuously by radiology nursing throughout the procedure under my direct supervision. FLUOROSCOPY TIME:  36 seconds (11 mGy) COMPLICATIONS: None immediate. PROCEDURE: Informed written consent was obtained from the patient after a discussion of the risks, benefits, and alternatives to treatment. Questions regarding the procedure were encouraged and answered. The right neck and chest were prepped with chlorhexidine in a sterile fashion, and a sterile drape was applied covering the operative field. Maximum barrier sterile technique with sterile gowns and gloves were used for the procedure. A timeout was performed prior to the initiation of the procedure. After creating a small venotomy incision, a micropuncture kit was utilized to access the internal jugular vein. Real-time ultrasound guidance was utilized for vascular access including the acquisition of a permanent ultrasound image documenting patency of the accessed vessel. The microwire was utilized to measure appropriate catheter length. A stiff Glidewire was advanced to the  level of the IVC and the micropuncture sheath was exchanged for a peel-away sheath. A palindrome tunneled hemodialysis catheter measuring 23 cm from tip to cuff was tunneled in a retrograde fashion from the anterior chest wall to the venotomy incision. The catheter was then placed through the peel-away sheath with tips ultimately positioned within the superior aspect of the right atrium. Final catheter positioning was confirmed and documented with a spot radiographic image.  The catheter aspirates and flushes normally. The catheter was flushed with appropriate volume heparin dwells. The catheter exit site was secured with a 0-Prolene retention suture. The venotomy incision was closed with Dermabond and Steri-strips. Dressings were applied. The patient tolerated the procedure well without immediate post procedural complication. IMPRESSION: Successful placement of 23 cm tip to cuff tunneled hemodialysis catheter via the right internal jugular vein with tips terminating within the superior aspect of the right atrium. The catheter is ready for immediate use. Electronically Signed   By: Simonne Come M.D.   On: 08/16/2022 16:37   IR US Guide Vasc Access Right  Result Date: 08/16/2022 INDICATION: End-stage renal disease. In need of durable intravenous access for the initiation hemodialysis. EXAM: TUNNELED CENTRAL VENOUS HEMODIALYSIS CATHETER PLACEMENT WITH ULTRASOUND AND FLUOROSCOPIC GUIDANCE MEDICATIONS: Patient is currently admitted to the hospital receiving intravenous antibiotics. The antibiotic was given in an appropriate time interval prior to skin puncture. ANESTHESIA/SEDATION: Moderate (conscious) sedation was employed during this procedure. A total of Versed 1.5 mg and Fentanyl 50 mcg was administered intravenously. Moderate Sedation Time: 11 minutes. The patient's level of consciousness and vital signs were monitored continuously by radiology nursing throughout the procedure under my direct supervision.  FLUOROSCOPY TIME:  36 seconds (11 mGy) COMPLICATIONS: None immediate. PROCEDURE: Informed written consent was obtained from the patient after a discussion of the risks, benefits, and alternatives to treatment. Questions regarding the procedure were encouraged and answered. The right neck and chest were prepped with chlorhexidine in a sterile fashion, and a sterile drape was applied covering the operative field. Maximum barrier sterile technique with sterile gowns and gloves were used for the procedure. A timeout was performed prior to the initiation of the procedure. After creating a small venotomy incision, a micropuncture kit was utilized to access the internal jugular vein. Real-time ultrasound guidance was utilized for vascular access including the acquisition of a permanent ultrasound image documenting patency of the accessed vessel. The microwire was utilized to measure appropriate catheter length. A stiff Glidewire was advanced to the level of the IVC and the micropuncture sheath was exchanged for a peel-away sheath. A palindrome tunneled hemodialysis catheter measuring 23 cm from tip to cuff was tunneled in a retrograde fashion from the anterior chest wall to the venotomy incision. The catheter was then placed through the peel-away sheath with tips ultimately positioned within the superior aspect of the right atrium. Final catheter positioning was confirmed and documented with a spot radiographic image. The catheter aspirates and flushes normally. The catheter was flushed with appropriate volume heparin dwells. The catheter exit site was secured with a 0-Prolene retention suture. The venotomy incision was closed with Dermabond and Steri-strips. Dressings were applied. The patient tolerated the procedure well without immediate post procedural complication. IMPRESSION: Successful placement of 23 cm tip to cuff tunneled hemodialysis catheter via the right internal jugular vein with tips terminating within the  superior aspect of the right atrium. The catheter is ready for immediate use. Electronically Signed   By: Simonne Come M.D.   On: 08/16/2022 16:37    Medications: Infusions:   Scheduled Medications:  sodium chloride   Intravenous Once   amLODipine  10 mg Oral Daily   aspirin  81 mg Oral Daily   atorvastatin  80 mg Oral QHS   carvedilol  12.5 mg Oral BID WC   Chlorhexidine Gluconate Cloth  6 each Topical Daily   Chlorhexidine Gluconate Cloth  6 each Topical Q0600   Chlorhexidine Gluconate Cloth  6 each Topical Q0600   darbepoetin (ARANESP) injection - NON-DIALYSIS  40 mcg Subcutaneous Q Thu-1800   feeding supplement (NEPRO CARB STEADY)  237 mL Oral BID BM   hydrALAZINE  50 mg Oral Q8H   insulin aspart  0-5 Units Subcutaneous QHS   insulin aspart  0-6 Units Subcutaneous TID WC   multivitamin  1 tablet Oral QHS   mouth rinse  15 mL Mouth Rinse 4 times per day   pantoprazole  40 mg Oral Daily   sertraline  50 mg Oral QODAY   sodium chloride flush  3 mL Intravenous Q12H   tamsulosin  0.4 mg Oral QPC supper    have reviewed scheduled and prn medications.  Physical Exam: General:NAD, comfortable Heart:RRR, s1s2 nl Lungs: Basal decreased breath sound.   Abdomen:soft, Non-tender, non-distended Extremities: Bilateral lower extremities has chronic edema and skin changes Dialysis Access: Right IJ TDC in place.  Anitra Doxtater Prasad Mercades Bajaj 08/18/2022,10:32 AM  LOS: 10 days

## 2022-08-19 ENCOUNTER — Other Ambulatory Visit (HOSPITAL_COMMUNITY): Payer: Medicare (Managed Care)

## 2022-08-19 ENCOUNTER — Encounter (HOSPITAL_COMMUNITY): Payer: Self-pay | Admitting: Family Medicine

## 2022-08-19 DIAGNOSIS — I10 Essential (primary) hypertension: Secondary | ICD-10-CM | POA: Diagnosis not present

## 2022-08-19 DIAGNOSIS — E1122 Type 2 diabetes mellitus with diabetic chronic kidney disease: Secondary | ICD-10-CM | POA: Diagnosis not present

## 2022-08-19 DIAGNOSIS — I5033 Acute on chronic diastolic (congestive) heart failure: Secondary | ICD-10-CM | POA: Diagnosis not present

## 2022-08-19 DIAGNOSIS — D61818 Other pancytopenia: Secondary | ICD-10-CM | POA: Diagnosis not present

## 2022-08-19 LAB — CBC
HCT: 24.3 % — ABNORMAL LOW (ref 39.0–52.0)
Hemoglobin: 7.7 g/dL — ABNORMAL LOW (ref 13.0–17.0)
MCH: 28 pg (ref 26.0–34.0)
MCHC: 31.7 g/dL (ref 30.0–36.0)
MCV: 88.4 fL (ref 80.0–100.0)
Platelets: 66 10*3/uL — ABNORMAL LOW (ref 150–400)
RBC: 2.75 MIL/uL — ABNORMAL LOW (ref 4.22–5.81)
RDW: 16.8 % — ABNORMAL HIGH (ref 11.5–15.5)
WBC: 1.5 10*3/uL — ABNORMAL LOW (ref 4.0–10.5)
nRBC: 0 % (ref 0.0–0.2)

## 2022-08-19 LAB — CBC WITH DIFFERENTIAL/PLATELET
Abs Immature Granulocytes: 0.31 10*3/uL — ABNORMAL HIGH (ref 0.00–0.07)
Basophils Absolute: 0 10*3/uL (ref 0.0–0.1)
Basophils Relative: 1 %
Eosinophils Absolute: 0.1 10*3/uL (ref 0.0–0.5)
Eosinophils Relative: 5 %
HCT: 24.7 % — ABNORMAL LOW (ref 39.0–52.0)
Hemoglobin: 7.9 g/dL — ABNORMAL LOW (ref 13.0–17.0)
Immature Granulocytes: 20 %
Lymphocytes Relative: 29 %
Lymphs Abs: 0.5 10*3/uL — ABNORMAL LOW (ref 0.7–4.0)
MCH: 28 pg (ref 26.0–34.0)
MCHC: 32 g/dL (ref 30.0–36.0)
MCV: 87.6 fL (ref 80.0–100.0)
Monocytes Absolute: 0.1 10*3/uL (ref 0.1–1.0)
Monocytes Relative: 5 %
Neutro Abs: 0.6 10*3/uL — ABNORMAL LOW (ref 1.7–7.7)
Neutrophils Relative %: 40 %
Platelets: 69 10*3/uL — ABNORMAL LOW (ref 150–400)
RBC: 2.82 MIL/uL — ABNORMAL LOW (ref 4.22–5.81)
RDW: 16.8 % — ABNORMAL HIGH (ref 11.5–15.5)
Smear Review: NORMAL
WBC: 1.6 10*3/uL — ABNORMAL LOW (ref 4.0–10.5)
nRBC: 0 % (ref 0.0–0.2)

## 2022-08-19 LAB — TYPE AND SCREEN
ABO/RH(D): O POS
Antibody Screen: NEGATIVE
Unit division: 0

## 2022-08-19 LAB — RENAL FUNCTION PANEL
Albumin: 2.5 g/dL — ABNORMAL LOW (ref 3.5–5.0)
Anion gap: 13 (ref 5–15)
BUN: 68 mg/dL — ABNORMAL HIGH (ref 8–23)
CO2: 22 mmol/L (ref 22–32)
Calcium: 7.8 mg/dL — ABNORMAL LOW (ref 8.9–10.3)
Chloride: 100 mmol/L (ref 98–111)
Creatinine, Ser: 4.17 mg/dL — ABNORMAL HIGH (ref 0.61–1.24)
GFR, Estimated: 15 mL/min — ABNORMAL LOW (ref >60)
Glucose, Bld: 150 mg/dL — ABNORMAL HIGH (ref 70–99)
Phosphorus: 4.5 mg/dL (ref 2.5–4.6)
Potassium: 3.3 mmol/L — ABNORMAL LOW (ref 3.5–5.1)
Sodium: 135 mmol/L (ref 135–145)

## 2022-08-19 LAB — BPAM RBC
Blood Product Expiration Date: 202407212359
ISSUE DATE / TIME: 202406231430
Unit Type and Rh: 5100

## 2022-08-19 LAB — MAGNESIUM: Magnesium: 2.1 mg/dL (ref 1.7–2.4)

## 2022-08-19 LAB — GLUCOSE, CAPILLARY
Glucose-Capillary: 131 mg/dL — ABNORMAL HIGH (ref 70–99)
Glucose-Capillary: 134 mg/dL — ABNORMAL HIGH (ref 70–99)
Glucose-Capillary: 126 mg/dL — ABNORMAL HIGH (ref 70–99)
Glucose-Capillary: 178 mg/dL — ABNORMAL HIGH (ref 70–99)

## 2022-08-19 MED ORDER — HYDRALAZINE HCL 50 MG PO TABS: 75.0000 mg | ORAL_TABLET | Freq: Three times a day (TID) | ORAL | Status: DC

## 2022-08-19 MED ORDER — DARBEPOETIN ALFA 100 MCG/0.5ML IJ SOSY
100.0000 ug | PREFILLED_SYRINGE | INTRAMUSCULAR | Status: DC
  Administered 2022-08-21: 100 ug via SUBCUTANEOUS
  Filled 2022-08-19: qty 0.5

## 2022-08-19 MED ORDER — CARVEDILOL 25 MG PO TABS
25.0000 mg | ORAL_TABLET | Freq: Two times a day (BID) | ORAL | Status: DC
  Administered 2022-08-19 – 2022-09-04 (×24): 25 mg via ORAL
  Filled 2022-08-19 (×29): qty 1

## 2022-08-19 MED ORDER — HYDRALAZINE HCL 50 MG PO TABS
50.0000 mg | ORAL_TABLET | ORAL | Status: DC
  Administered 2022-08-19 – 2022-09-04 (×56): 50 mg via ORAL
  Filled 2022-08-19 (×60): qty 1

## 2022-08-19 MED ORDER — HEPARIN SODIUM (PORCINE) 1000 UNIT/ML IJ SOLN
INTRAMUSCULAR | Status: AC
  Administered 2022-08-19: 3800 [IU]
  Filled 2022-08-19: qty 4

## 2022-08-19 NOTE — Progress Notes (Signed)
PROGRESS NOTE  [name] WUJ:[id number] DOB: 1960-04-07   PCP: [name], MD  Patient is from: SNF.  DOA: 08/08/2022 LOS: 11  Chief complaints Chief Complaint  Patient presents with   abnormal labs      Brief Narrative / Interim history:  62 y.o. male with a history of diabetes mellitus type 2, hypertension, CKD stage IV, PVD/left BKA, CVA, heart failure and pancytopenia.  Patient presented secondary to low hemoglobin.  He was transfused 1 unit with improvement in hemoglobin.  Hospitalization complicated by AKI on CKD-4 and acute on chronic CHF.  Nephrology consulted and started on Lasix drip.  AKI worse.  Initially felt to be not a good candidate for hemodialysis based on his functional status and refusal to HD.  Palliative consultation was recommended by nephrology.  However, he later changed his mind and agreed to have dialysis.  Had [facility] placed and HD started on 6/21.  Nephrology and palliative medicine following.  Oncology consulted and ordered bone marrow biopsy.   Subjective: Seen and examined earlier this morning while on dialysis.  No major events overnight of this morning.  No complaints.  Objective: Vitals:   08/19/22 0830 08/19/22 0900 08/19/22 0930 08/19/22 1000  BP: (!) 175/92 (!) 178/88 (!) 176/89 (!) 170/80  Pulse: 70 70 70 70  Resp: 12 14 13 13   Temp:      TempSrc:      SpO2: 100% 100% 100% 100%  Height:        Examination:  GENERAL: No apparent distress.  Sleepy. HEENT: MMM.  Vision and hearing grossly intact.  NECK: Supple.  No apparent JVD.  RESP:  No IWOB.  Fair aeration bilaterally. CVS:  RRR. Heart sounds normal.  ABD/GI/GU: BS+. Abd soft, NTND.  MSK/EXT: Left BKA.  2+ RUE and RLE edema.  SKIN: RLE skin changes consistent with chronic venous insufficiency. NEURO: Sleepy but wakes to voice.  Oriented appropriately. PSYCH: Somewhat flat affect.  Procedures:  None  Microbiology summarized: None  Assessment and plan: Principal  Problem:   Pancytopenia (HCC) Active Problems:   Acute renal failure superimposed on stage 4 chronic kidney disease (HCC)   Acute on chronic heart failure with preserved ejection fraction (HFpEF) (HCC)   Essential hypertension   History of CVA (cerebrovascular accident)   T2DM (type 2 diabetes mellitus) (HCC)   PAD (peripheral artery disease) (HCC)   Depression   Protein-calorie malnutrition, moderate (HCC)  Pancytopenia: Looks like chronic issue.  Anemia panel consistent with anemia of chronic disease.  No overt bleeding.  LDH elevated.  Haptoglobin within normal.  Transfused 1 unit with appropriate response on 6/23 -Nephrology started Aranesp and increasing dose. -Hematology consulted and ordered bone marrow biopsy -Continue monitoring CBC   AKI on CKD stage IV/azotemia: Started HD on 6/22. Recent Labs    08/11/22 0049 08/12/22 0121 08/13/22 0036 08/14/22 0129 08/15/22 0057 08/16/22 0102 08/16/22 2022 08/17/22 0741 08/18/22 0748 08/19/22 0049  BUN 90* 88* 97* 100* 106* 104* 111* 87* 65* 68*  CREATININE 5.31* 5.41* 5.58* 5.55* 5.48* 5.61* 5.93* 4.94* 4.26* 4.17*  -TDC placed and HD started on 6/21. -Nephrology following -Continue monitoring  Acute metabolic encephalopathy: Seems to have resolved. -Nephrology and palliative medicine following.   Acute on chronic diastolic heart failure: Difficult to manage with worsening renal function. -Fluid management by HD per nephrology  Urinary retention: Foley catheter placed on 6/17. -Continue Foley catheter and Flomax.   Primary hypertension: BP slightly elevated.  Likely due to fluid  overload -Increase Coreg to 25 mg twice daily -Increase hydralazine from 50 to 75 mg 3 times daily -Continue amlodipine -May need more ultrafiltration   Controlled IDDM-2 with hyperglycemia and CKD-4: A1c 6.1%. Recent Labs  Lab 08/18/22 0609 08/18/22 1114 08/18/22 1543 08/18/22 2150 08/19/22 0614  GLUCAP 138* 143* 152* 174* 131*   -Continue current insulin regimen and CBG monitoring   Peripheral artery disease/left BKA -Continue Lipitor, aspirin  RUE swelling/lymphedema: Chronic.  Had multiple venous Dopplers that were negative for DVT in the past.   Depression -Continue sertraline  Chronic respiratory failure: On 2 L at home.  Stable on room air but prefers to wear.  Hypokalemia: Mild. -Per nephrology.   Goal of care: DNR/DNI.  Difficult to engage patient for goal of care discussion. -Palliative medicine following  Grief: Lost his brother 2 days prior to this hospitalization. -Appreciate emotional support by chaplain.  Morbid obesity/moderate malnutrition Body mass index is 42.1 kg/m. Nutrition Problem: Moderate Malnutrition Etiology: chronic illness (HF, PAD) Signs/Symptoms: mild fat depletion, mild muscle depletion, edema Interventions: Liberalize Diet, MVI, Nepro shake   DVT prophylaxis:  Place and maintain sequential compression device Start: 08/15/22 0726 SCDs Start: 08/08/22 2241  Code Status: DNR/DNI Family Communication: None at bedside. Level of care: Med-Surg Status is: Inpatient Remains inpatient appropriate because: Renal failure/acute on chronic CHF/pancytopenia   Consultants:  Nephrology Palliative medicine Hematology/oncology  35 minutes with more than 50% spent in reviewing records, counseling patient/family and coordinating care.   Sch Meds:  Scheduled Meds:  amLODipine  10 mg Oral Daily   aspirin  81 mg Oral Daily   atorvastatin  80 mg Oral QHS   carvedilol  12.5 mg Oral BID WC   Chlorhexidine Gluconate Cloth  6 each Topical Q0600   [START ON 08/21/2022] darbepoetin (ARANESP) injection - NON-DIALYSIS  100 mcg Subcutaneous Q Wed-1800   feeding supplement (NEPRO CARB STEADY)  237 mL Oral BID BM   hydrALAZINE  50 mg Oral Q8H   insulin aspart  0-5 Units Subcutaneous QHS   insulin aspart  0-6 Units Subcutaneous TID WC   multivitamin  1 tablet Oral QHS   mouth rinse   15 mL Mouth Rinse 4 times per day   pantoprazole  40 mg Oral Daily   sertraline  50 mg Oral QODAY   sodium chloride flush  3 mL Intravenous Q12H   tamsulosin  0.4 mg Oral QPC supper   Continuous Infusions: PRN Meds:.acetaminophen **OR** acetaminophen, albuterol, ALPRAZolam, HYDROmorphone (DILAUDID) injection, ondansetron (ZOFRAN) IV, mouth rinse, mouth rinse, white petrolatum  Antimicrobials: Anti-infectives (From admission, onward)    Start     Dose/Rate Route Frequency Ordered Stop   08/16/22 1147  ceFAZolin (ANCEF) IVPB 2g/100 mL premix        over 30 Minutes Intravenous Continuous PRN 08/16/22 1147 08/16/22 1147        I have personally reviewed the following labs and images: CBC: Recent Labs  Lab 08/13/22 0036 08/14/22 0129 08/16/22 2022 08/17/22 0741 08/18/22 0748 08/19/22 0049 08/19/22 0733  WBC 1.6*   < > 1.3* 1.2* 1.5* 1.5* 1.6*  NEUTROABS 0.6*  --   --   --   --   --  0.6*  HGB 7.4*   < > 7.0* 6.8* 6.7* 7.7* 7.9*  HCT 23.0*   < > 22.3* 21.4* 20.8* 24.3* 24.7*  MCV 88.8   < > 88.8 87.7 86.3 88.4 87.6  PLT 48*   < > 58* 62* 62* 66* 69*   < > =  values in this interval not displayed.   BMP &GFR Recent Labs  Lab 08/14/22 0129 08/15/22 0057 08/16/22 0102 08/16/22 2022 08/17/22 0741 08/18/22 0748 08/19/22 0049  NA 135 135 135 134* 136 136 135  K 3.8 3.6 3.4* 3.4* 3.3* 3.2* 3.3*  CL 101 101 102 100 101 100 100  CO2 21* 21* 21* 20* 23 23 22   GLUCOSE 122* 166* 122* 146* 133* 145* 150*  BUN 100* 106* 104* 111* 87* 65* 68*  CREATININE 5.55* 5.48* 5.61* 5.93* 4.94* 4.26* 4.17*  CALCIUM 7.5* 7.7* 7.7* 7.8* 7.8* 7.8* 7.8*  MG 2.0 2.0  --   --  2.1 1.9 2.1  PHOS 5.5* 5.6* 5.4* 5.8* 4.8* 4.5 4.5   Estimated Creatinine Clearance: 24 mL/min (A) (by C-G formula based on SCr of 4.17 mg/dL (H)). Liver & Pancreas: Recent Labs  Lab 08/16/22 0102 08/16/22 2022 08/17/22 0741 08/18/22 0748 08/19/22 0049  ALBUMIN 2.5* 2.6* 2.5* 2.5* 2.5*   No results for input(s):  "LIPASE", "AMYLASE" in the last 168 hours. No results for input(s): "AMMONIA" in the last 168 hours. Diabetic: No results for input(s): "HGBA1C" in the last 72 hours. Recent Labs  Lab 08/18/22 0609 08/18/22 1114 08/18/22 1543 08/18/22 2150 08/19/22 0614  GLUCAP 138* 143* 152* 174* 131*   Cardiac Enzymes: No results for input(s): "CKTOTAL", "CKMB", "CKMBINDEX", "TROPONINI" in the last 168 hours. No results for input(s): "PROBNP" in the last 8760 hours. Coagulation Profile: No results for input(s): "INR", "PROTIME" in the last 168 hours. Thyroid Function Tests: No results for input(s): "TSH", "T4TOTAL", "FREET4", "T3FREE", "THYROIDAB" in the last 72 hours. Lipid Profile: No results for input(s): "CHOL", "HDL", "LDLCALC", "TRIG", "CHOLHDL", "LDLDIRECT" in the last 72 hours. Anemia Panel: No results for input(s): "VITAMINB12", "FOLATE", "FERRITIN", "TIBC", "IRON", "RETICCTPCT" in the last 72 hours. Urine analysis:    Component Value Date/Time   COLORURINE YELLOW 08/09/2022 0530   APPEARANCEUR CLEAR 08/09/2022 0530   APPEARANCEUR Clear 12/31/2013 2116   LABSPEC 1.025 08/09/2022 0530   LABSPEC 1.032 12/31/2013 2116   PHURINE 5.5 08/09/2022 0530   GLUCOSEU NEGATIVE 08/09/2022 0530   GLUCOSEU >=500 12/31/2013 2116   HGBUR NEGATIVE 08/09/2022 0530   BILIRUBINUR NEGATIVE 08/09/2022 0530   BILIRUBINUR Negative 12/31/2013 2116   KETONESUR NEGATIVE 08/09/2022 0530   PROTEINUR 100 (A) 08/09/2022 0530   UROBILINOGEN 1.0 08/28/2013 1709   NITRITE NEGATIVE 08/09/2022 0530   LEUKOCYTESUR NEGATIVE 08/09/2022 0530   LEUKOCYTESUR Negative 12/31/2013 2116   Sepsis Labs: Invalid input(s): "PROCALCITONIN", "LACTICIDVEN"  Microbiology: No results found for this or any previous visit (from the past 240 hour(s)).  Radiology Studies: No results found.    Tamekia Rotter T. Jernee Murtaugh [facility]  If 7PM-7AM, please contact night-coverage [email or web address] 08/19/2022, 10:17 AM

## 2022-08-19 NOTE — Progress Notes (Signed)
   Bone marrow bx rescheduled to 6/25 Pt in dialysis today]new orders placed

## 2022-08-19 NOTE — Progress Notes (Addendum)
Contacted [facility] admissions to request an update on pt's referral from Friday. Awaiting a response.   Olivia Canter Renal Navigator [phone]  Addendum at 1:20 pm: Pt has been accepted at [facility]) MWF 12:20 chair time. Pt will need to arrive at 11:20 for first appt to complete paperwork prior to treatment. Pt's start date will be determined based on when pt able to tolerate HD in chair. Update provided to CSW and nephrologist. Will assist as needed.

## 2022-08-19 NOTE — Progress Notes (Signed)
   08/19/22 1121  Vitals  Temp 98.1 F (36.7 C)  Pulse Rate 73  Resp 14  BP (!) 182/89  SpO2 100 %  O2 Device Nasal Cannula  Weight  (pt weight is not calibrated on bed. Reads 15.4. No weight gottne)  Oxygen Therapy  O2 Flow Rate (L/min) 3 L/min  Patient Activity (if Appropriate) In bed  Pulse Oximetry Type Continuous  Post Treatment  Dialyzer Clearance Clear  Duration of HD Treatment -hour(s) 3 hour(s)  Hemodialysis Intake (mL) 0 mL  Liters Processed 54  Fluid Removed (mL) 2000 mL  Tolerated HD Treatment Yes  Post-Hemodialysis Comments Patient tolerated tx. well. Goal met Patient is stable vitals stable   Received patient in bed to unit.  Alert and oriented.  Informed consent signed and in chart.   TX duration:3 hours  Patient tolerated well.  Transported back to the room  Alert, without acute distress.  Hand-off given to patient's nurse.   Access used: Yes Access issues: No  Total UF removed: 2000 Medication(s) given: See MAR Post HD VS: See Above Grid Post HD weight: Unable to get weight, due to bed scale is off   [facility]

## 2022-08-19 NOTE — Progress Notes (Signed)
OT Cancellation Note  Patient Details Name: [name] MRN: [id number] DOB: 1960-08-18   Cancelled Treatment:    Reason Eval/Treat Not Completed: Patient at procedure or test/ unavailable (off unit at HD)  Carver Fila, OTD, OTR/L SecureChat Preferred Acute [facility] [phone]   Dalphine Handing 08/19/2022, 8:37 AM

## 2022-08-19 NOTE — Progress Notes (Signed)
This chaplain is present for F/U spiritual care.   The Pt. responded with interest to the chaplain's greeting and updated her on his HD. The chaplain listened reflectively for a few minutes and then accepted the Pt. invitation to continue the visit on Tuesday.   Chaplain Stephanie Acre [phone]

## 2022-08-19 NOTE — Progress Notes (Addendum)
[facility] Progress Note    Assessment/ Plan:   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  Patient has had multiple consultation with palliative care and discussed goals of care.  It seems like it was decided to pursue dialysis as per patient interest and monitor.  Based on the current functional status, deconditioning and challenges with mobility I am worried that he may not be a good candidate for outpatient dialysis.  He is nonoliguric but certainly no sign of renal recovery.   -s/p RIJ TDC placed by IR followed by dialysis on 6/21. Will plan for HD on MWF schedule -Renal navigator was contacted to arrange outpatient dialysis.  However, we need to make sure that he can tolerate HD in chair before discharge. -can plan for permanent access however would prefer to have his pancytopenia to be evaluated first   Acute urinary retention: On Flomax and Foley catheter.   Pancytopenia/anemia: hemonc consulted for further evaluation.  Iron saturation 32% with high ferritin.  On ESA. Pending BM biopsy. Transfuse PRN for hgb <7   Acute on chronic diastolic CHF: Treated with IV diuretics initially.  Now managing volume with HD.   HTN/volume: Continue current antihypertensives, managing volume with HD.   Metabolic acidosis: Managed with dialysis, discontinued sodium bicarbonate., bicarb wnl   Hypokalemia: Dialyzing with high potassium bath.  DNR/DNI  Subjective:   Patient seen and examined on dialysis. Tolerating HD, no complaints/acute events. Pending bone marrow biopsy. Dialyzing in bed.   Objective:   BP (!) 176/89 (BP Location: Left Wrist)   Pulse 70   Temp 98.7 F (37.1 C) (Oral)   Resp 13   Ht 5\' 8"  (1.727 m)   Wt 125.6 kg   SpO2 100%   BMI 42.10 kg/m   Intake/Output Summary (Last 24 hours) at 08/19/2022 0946 Last data filed at 08/19/2022 0755 Gross per 24 hour  Intake 687.08 ml  Output 1275 ml  Net -587.92 ml    Weight change:   Physical Exam: Gen: NAD CVS: RRR Resp: CTA B/L Abd: soft, nt/nd Ext: woody edema RLE, left BKA Neuro: awake, following commands Dialysis access: RIJ TDC  Imaging: No results found.  Labs: BMET Recent Labs  Lab 08/14/22 0129 08/15/22 0057 08/16/22 0102 08/16/22 2022 08/17/22 0741 08/18/22 0748 08/19/22 0049  NA 135 135 135 134* 136 136 135  K 3.8 3.6 3.4* 3.4* 3.3* 3.2* 3.3*  CL 101 101 102 100 101 100 100  CO2 21* 21* 21* 20* 23 23 22   GLUCOSE 122* 166* 122* 146* 133* 145* 150*  BUN 100* 106* 104* 111* 87* 65* 68*  CREATININE 5.55* 5.48* 5.61* 5.93* 4.94* 4.26* 4.17*  CALCIUM 7.5* 7.7* 7.7* 7.8* 7.8* 7.8* 7.8*  PHOS 5.5* 5.6* 5.4* 5.8* 4.8* 4.5 4.5   CBC Recent Labs  Lab 08/13/22 0036 08/14/22 0129 08/17/22 0741 08/18/22 0748 08/19/22 0049 08/19/22 0733  WBC 1.6*   < > 1.2* 1.5* 1.5* 1.6*  NEUTROABS 0.6*  --   --   --   --  0.6*  HGB 7.4*   < > 6.8* 6.7* 7.7* 7.9*  HCT 23.0*   < > 21.4* 20.8* 24.3* 24.7*  MCV 88.8   < > 87.7 86.3 88.4 87.6  PLT 48*   < > 62* 62* 66* 69*   < > = values in this interval not displayed.    Medications:     amLODipine  10 mg Oral Daily  aspirin  81 mg Oral Daily   atorvastatin  80 mg Oral QHS   carvedilol  12.5 mg Oral BID WC   Chlorhexidine Gluconate Cloth  6 each Topical Q0600   [START ON 08/22/2022] darbepoetin (ARANESP) injection - NON-DIALYSIS  100 mcg Subcutaneous Q Thu-1800   feeding supplement (NEPRO CARB STEADY)  237 mL Oral BID BM   hydrALAZINE  50 mg Oral Q8H   insulin aspart  0-5 Units Subcutaneous QHS   insulin aspart  0-6 Units Subcutaneous TID WC   multivitamin  1 tablet Oral QHS   mouth rinse  15 mL Mouth Rinse 4 times per day   pantoprazole  40 mg Oral Daily   sertraline  50 mg Oral QODAY   sodium chloride flush  3 mL Intravenous Q12H   tamsulosin  0.4 mg Oral QPC supper      [name], MD [facility] 08/19/2022, 9:46 AM

## 2022-08-19 NOTE — Consult Note (Signed)
Chief Complaint: Patient was seen in consultation today for Bone marrow biopsy Chief Complaint  Patient presents with   abnormal labs    at the request of Dr Oda Kilts   Supervising Physician: Gilmer Mor  Patient Status: [facility] - In-pt  History of Present Illness: [name] is a 62 y.o. male  FULL Code status per pt Progressive pancytopenia Multiple comorbidities Known Hx pancytopenia--- fecal occult blood and volvulus/megacolon Admission labs showing anemia Labs Feb wnl Worsening renal function and started on dialysis this admission Per Oncology--- renal failure not likely causing leukopenia and thrombocytopenia  Scheduled for Bone marrow Biopsy in IR today---if schedule permits    Past Medical History:  Diagnosis Date   Acute on chronic diastolic CHF (congestive heart failure) (HCC) 08/22/2020   Acute on chronic heart failure with preserved ejection fraction (HCC)    Acute renal failure superimposed on stage 3a chronic kidney disease (HCC) 11/18/2020   Arthritis    Bacteremia due to Enterococcus 12/09/2021   Bipolar affective (HCC)    CHF (congestive heart failure) (HCC)    Claustrophobia    Severe   Cocaine abuse (HCC) 05/31/2014   Cocaine dependence, uncomplicated (HCC) 05/31/2014   Depression    Diabetes mellitus    uncontrolled    Elevated CK    Heart murmur    Hypertension    Ileus (HCC) 11/18/2020   Metabolic acidosis 10/23/2021   Non-traumatic rhabdomyolysis 01/01/2020   Pericardial effusion 08/22/2020   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   Suicidal ideations 08/28/2013    Past Surgical History:  Procedure Laterality Date   ABDOMINAL AORTOGRAM W/LOWER EXTREMITY Left 03/02/2021   Procedure: ABDOMINAL AORTOGRAM W/LOWER EXTREMITY;  Surgeon: Leonie Douglas, MD;  Location: [facility] INVASIVE CV LAB;  Service: Cardiovascular;  Laterality: Left;   AMPUTATION Left 03/07/2021   Procedure: LEFT BELOW KNEE  AMPUTATION;  Surgeon: Leonie Douglas, MD;  Location: [facility] OR;  Service: Vascular;  Laterality: Left;   APPLICATION OF WOUND VAC Left 03/07/2021   Procedure: APPLICATION OF WOUND VAC;  Surgeon: Leonie Douglas, MD;  Location: [facility] OR;  Service: Vascular;  Laterality: Left;   BOWEL DECOMPRESSION N/A 01/19/2020   Procedure: BOWEL DECOMPRESSION;  Surgeon: Kathi Der, MD;  Location: [facility] ENDOSCOPY;  Service: Gastroenterology;  Laterality: N/A;   BOWEL DECOMPRESSION N/A 11/19/2020   Procedure: BOWEL DECOMPRESSION;  Surgeon: Charlott Rakes, MD;  Location: [facility] ENDOSCOPY;  Service: Endoscopy;  Laterality: N/A;   BOWEL DECOMPRESSION N/A 12/03/2020   Procedure: BOWEL DECOMPRESSION;  Surgeon: Kerin Salen, MD;  Location: [facility] ENDOSCOPY;  Service: Gastroenterology;  Laterality: N/A;   COLONOSCOPY WITH PROPOFOL N/A 01/19/2020   Procedure: COLONOSCOPY WITH PROPOFOL;  Surgeon: Kathi Der, MD;  Location: [facility] ENDOSCOPY;  Service: Gastroenterology;  Laterality: N/A;   COLONOSCOPY WITH PROPOFOL N/A 11/19/2020   Procedure: COLONOSCOPY WITH PROPOFOL;  Surgeon: Charlott Rakes, MD;  Location: [facility] ENDOSCOPY;  Service: Endoscopy;  Laterality: N/A;   COLONOSCOPY WITH PROPOFOL N/A 12/03/2020   Procedure: COLONOSCOPY WITH PROPOFOL;  Surgeon: Kerin Salen, MD;  Location: [facility] ENDOSCOPY;  Service: Gastroenterology;  Laterality: N/A;   IR FLUORO GUIDE CV LINE RIGHT  08/16/2022   IR US GUIDE VASC ACCESS RIGHT  08/16/2022   KNEE SURGERY     b/l knees; pending total knee    RADIOLOGY WITH ANESTHESIA N/A 07/04/2020   Procedure: MRI WITH ANESTHESIA BRAIN WITHOUT CONTRAST AND HEAD WITHOUT CONTRAST;  Surgeon: Radiologist, Medication, MD;  Location: [facility] OR;  Service: Radiology;  Laterality: N/A;   TRANSMETATARSAL AMPUTATION Left 03/01/2021   Procedure: TRANSMETATARSAL AMPUTATION;  Surgeon: Vivi Barrack, DPM;  Location: [facility] OR;  Service: Podiatry;  Laterality: Left;    Allergies: Other and Sertraline  Medications: Prior to  Admission medications   Medication Sig Start Date End Date Taking? Authorizing Provider  acetaminophen (TYLENOL) 650 MG CR tablet Take 650 mg by mouth every 4 (four) hours as needed (for discomfort- cannot exceed 3,000 mg from all combined sources/24 hours).   Yes [provider]  albuterol (VENTOLIN HFA) 108 (90 Base) MCG/ACT inhaler Inhale 2 puffs into the lungs every 6 (six) hours as needed for wheezing or shortness of breath.   Yes [provider]  amLODipine (NORVASC) 10 MG tablet Take 1 tablet (10 mg total) by mouth daily. Patient taking differently: Take 10 mg by mouth See admin instructions. Take 10 mg by mouth once a day and hold if Systolic is <100 or Diastolic <16. Call NP is B/P is over 160/90. 12/11/21  Yes [name], [name], DO  aspirin 81 MG chewable tablet Chew 81 mg by mouth daily.   Yes [provider]  atorvastatin (LIPITOR) 80 MG tablet Take 80 mg by mouth at bedtime.   Yes [provider]  brimonidine (ALPHAGAN) 0.2 % ophthalmic solution Place 1 drop into both eyes 2 (two) times daily.   Yes [provider]  carvedilol (COREG) 12.5 MG tablet Take 1 tablet (12.5 mg total) by mouth 2 (two) times daily with a meal. 04/02/22  Yes [name], [name], MD  docusate sodium (COLACE) 100 MG capsule Take 100 mg by mouth at bedtime.   Yes [provider]  dorzolamide (TRUSOPT) 2 % ophthalmic solution Place 1 drop into both eyes 2 (two) times daily.   Yes [provider]  ferrous sulfate 325 (65 FE) MG tablet Take 1 tablet (325 mg total) by mouth daily. 09/23/20  Yes [name], [name], DO  hydrALAZINE (APRESOLINE) 50 MG tablet Take 1 tablet (50 mg total) by mouth every 8 (eight) hours. Patient taking differently: Take 50 mg by mouth See admin instructions. Take 50 mg by mouth at 4 AM, 10 AM, 4 PM, and 10 PM- hold for a Systolic reading less than 100 04/02/22  Yes [name], [name], MD  insulin aspart (NOVOLOG FLEXPEN) 100 UNIT/ML  FlexPen Inject 2-10 Units into the skin 3 (three) times daily before meals. Inject 2-10 units into the skin three times a day before meals, per sliding scale: BGL 131-180 = 2 units; 181-240 = 4 units; 241-300 = 6 units; 301-350 = 8 units; 351-400 = 10 units; >400 = CALL MD   Yes [provider]  LANTUS SOLOSTAR 100 UNIT/ML Solostar Pen Inject 6 Units into the skin in the morning.   Yes [provider]  latanoprost (XALATAN) 0.005 % ophthalmic solution Place 1 drop into both eyes at bedtime.   Yes [provider]  Multiple Vitamin (MULTIVITAMIN) tablet Take 1 tablet by mouth daily.   Yes [provider]  ondansetron (ZOFRAN) 4 MG tablet Take 4 mg by mouth every 4 (four) hours as needed for nausea or vomiting.   Yes [provider]  OXYGEN Inhale 2 L/min into the lungs as needed (for sats below 90%).   Yes [provider]  pantoprazole (PROTONIX) 40 MG tablet Take 1 tablet (40 mg total) by mouth 2 (two) times daily. 07/20/22  Yes [name], Eric J, DO  potassium chloride SA (KLOR-CON M) 20 MEQ  tablet Take 20 mEq by mouth in the morning.   Yes [provider]  pregabalin (LYRICA) 75 MG capsule Take 1 capsule (75 mg total) by mouth daily. 10/31/21  Yes Maxwell, Allee, MD  PROTEIN PO Take 30 mLs by mouth 2 (two) times daily.   Yes [provider]  PROTEIN PO Take 30 mLs by mouth 2 (two) times daily. 0900 and 2100   Yes [provider]  sertraline (ZOLOFT) 50 MG tablet Take 50 mg by mouth every other day.   Yes [provider]  sodium bicarbonate 650 MG tablet Take 2 tablets (1,300 mg total) by mouth 2 (two) times daily. 10/31/21  Yes Alfredo Martinez, MD  tamsulosin (FLOMAX) 0.4 MG CAPS capsule Take 0.8 mg by mouth every evening.   Yes [provider]  torsemide (DEMADEX) 20 MG tablet Take 40-80 mg by mouth See admin instructions. Take 80 mg by mouth at 8 AM and 40 mg by mouth at 7 PM   Yes [provider]      Family History  Problem Relation Age of Onset   Diabetes Other    Cancer Mother        uterus; deceased    Heart attack Mother 66   Hypertension Other    Heart attack Father 69   Stroke Sister        12/2012 age 51 y.o    Social History   Socioeconomic History   Marital status: Single    Spouse name: Not on file   Number of children: Not on file   Years of education: Not on file   Highest education level: Not on file  Occupational History   Not on file  Tobacco Use   Smoking status: Former   Smokeless tobacco: Never  Vaping Use   Vaping Use: Never used  Substance and Sexual Activity   Alcohol use: No   Drug use: Not Currently    Types: Cocaine    Comment: Last use 2019   Sexual activity: Yes    Birth control/protection: Condom  Other Topics Concern   Not on file  Social History Narrative   Moved from [place]   Denies smoking, drinking   +cocaine history (denies recent use)   Originally from [place]    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[employer]   No kids, married                      Social Determinants of Corporate investment banker Strain: Not on file  Food Insecurity: No Food Insecurity (07/18/2022)   Hunger Vital Sign    Worried About Running Out of Food in the Last Year: Never true    Ran Out of Food in the Last Year: Never true  Transportation Needs: No Transportation Needs (07/18/2022)   PRAPARE - Administrator, Civil Service (Medical): No    Lack of Transportation (Non-Medical): No  Physical Activity: Not on file  Stress: Not on file  Social Connections: Not on file    Review of Systems: A 12 point ROS discussed and pertinent positives are indicated in the HPI above.  All other systems are negative.  Review of Systems  Constitutional:  Positive for activity change, appetite change and fatigue. Negative for fever.  Respiratory:  Negative for cough and shortness of breath.    Cardiovascular:  Negative for chest pain.  Gastrointestinal:  Negative for abdominal pain.  Neurological:  Positive for weakness.  Psychiatric/Behavioral:  Negative for behavioral problems and confusion.     Vital Signs: BP (!) 166/84 (BP Location: Left Wrist)   Pulse 67   Temp 98.4 F (36.9 C) (Oral)   Resp 18   Ht 5\' 8"  (1.727 m)   Wt 276 lb 14.4 oz (125.6 kg)   SpO2 100%   BMI 42.10 kg/m     Physical Exam Vitals reviewed.  HENT:     Mouth/Throat:     Mouth: Mucous membranes are moist.  Cardiovascular:     Rate and Rhythm: Normal rate and regular rhythm.     Heart sounds: Normal heart sounds.  Pulmonary:     Effort: Pulmonary effort is normal.     Breath sounds: Normal breath sounds.  Abdominal:     Palpations: Abdomen is soft.     Tenderness: There is no abdominal tenderness.  Musculoskeletal:        General: Normal range of motion.  Skin:    General: Skin is warm.  Neurological:     Mental Status: He is alert and oriented to person, place, and time.  Psychiatric:        Behavior: Behavior normal.     Imaging: IR Fluoro Guide CV Line Right  Result Date: 08/16/2022 INDICATION: End-stage renal disease. In need of durable intravenous access for the initiation hemodialysis. EXAM: TUNNELED CENTRAL VENOUS HEMODIALYSIS CATHETER PLACEMENT WITH ULTRASOUND AND FLUOROSCOPIC GUIDANCE MEDICATIONS: Patient is currently admitted to the hospital receiving intravenous antibiotics. The antibiotic was given in an appropriate time interval prior to skin puncture. ANESTHESIA/SEDATION: Moderate (conscious) sedation was employed during this procedure. A total of Versed 1.5 mg and Fentanyl 50 mcg was administered intravenously. Moderate Sedation Time: 11 minutes. The patient's level of consciousness and vital signs were monitored continuously by radiology nursing throughout the procedure under my direct supervision. FLUOROSCOPY TIME:  36 seconds (11 mGy) COMPLICATIONS: None immediate.  PROCEDURE: Informed written consent was obtained from the patient after a discussion of the risks, benefits, and alternatives to treatment. Questions regarding the procedure were encouraged and answered. The right neck and chest were prepped with chlorhexidine in a sterile fashion, and a sterile drape was applied covering the operative field. Maximum barrier sterile technique with sterile gowns and gloves were used for the procedure. A timeout was performed prior to the initiation of the procedure. After creating a small venotomy incision, a micropuncture kit was utilized to access the internal jugular vein. Real-time ultrasound guidance was utilized for vascular access including the acquisition of a permanent ultrasound image documenting patency of the accessed vessel. The microwire was utilized to measure appropriate catheter length. A stiff Glidewire was advanced to the level of the IVC and the micropuncture sheath was exchanged for a peel-away sheath. A palindrome tunneled hemodialysis catheter measuring 23 cm from tip to cuff was tunneled in a retrograde fashion from the anterior chest wall to the venotomy incision. The catheter was then placed through the peel-away sheath with tips ultimately positioned within the superior aspect of the right atrium. Final catheter positioning was confirmed and documented with a spot radiographic image. The catheter aspirates and flushes normally. The catheter was flushed with appropriate volume heparin dwells. The catheter exit site was secured with a 0-Prolene retention suture. The venotomy incision was closed with Dermabond and Steri-strips. Dressings were applied. The patient tolerated the procedure well without immediate post procedural complication. IMPRESSION: Successful placement of 23 cm tip to cuff tunneled hemodialysis catheter via the  right internal jugular vein with tips terminating within the superior aspect of the right atrium. The catheter is ready for  immediate use. Electronically Signed   By: Simonne Come M.D.   On: 08/16/2022 16:37   IR US Guide Vasc Access Right  Result Date: 08/16/2022 INDICATION: End-stage renal disease. In need of durable intravenous access for the initiation hemodialysis. EXAM: TUNNELED CENTRAL VENOUS HEMODIALYSIS CATHETER PLACEMENT WITH ULTRASOUND AND FLUOROSCOPIC GUIDANCE MEDICATIONS: Patient is currently admitted to the hospital receiving intravenous antibiotics. The antibiotic was given in an appropriate time interval prior to skin puncture. ANESTHESIA/SEDATION: Moderate (conscious) sedation was employed during this procedure. A total of Versed 1.5 mg and Fentanyl 50 mcg was administered intravenously. Moderate Sedation Time: 11 minutes. The patient's level of consciousness and vital signs were monitored continuously by radiology nursing throughout the procedure under my direct supervision. FLUOROSCOPY TIME:  36 seconds (11 mGy) COMPLICATIONS: None immediate. PROCEDURE: Informed written consent was obtained from the patient after a discussion of the risks, benefits, and alternatives to treatment. Questions regarding the procedure were encouraged and answered. The right neck and chest were prepped with chlorhexidine in a sterile fashion, and a sterile drape was applied covering the operative field. Maximum barrier sterile technique with sterile gowns and gloves were used for the procedure. A timeout was performed prior to the initiation of the procedure. After creating a small venotomy incision, a micropuncture kit was utilized to access the internal jugular vein. Real-time ultrasound guidance was utilized for vascular access including the acquisition of a permanent ultrasound image documenting patency of the accessed vessel. The microwire was utilized to measure appropriate catheter length. A stiff Glidewire was advanced to the level of the IVC and the micropuncture sheath was exchanged for a peel-away sheath. A palindrome tunneled  hemodialysis catheter measuring 23 cm from tip to cuff was tunneled in a retrograde fashion from the anterior chest wall to the venotomy incision. The catheter was then placed through the peel-away sheath with tips ultimately positioned within the superior aspect of the right atrium. Final catheter positioning was confirmed and documented with a spot radiographic image. The catheter aspirates and flushes normally. The catheter was flushed with appropriate volume heparin dwells. The catheter exit site was secured with a 0-Prolene retention suture. The venotomy incision was closed with Dermabond and Steri-strips. Dressings were applied. The patient tolerated the procedure well without immediate post procedural complication. IMPRESSION: Successful placement of 23 cm tip to cuff tunneled hemodialysis catheter via the right internal jugular vein with tips terminating within the superior aspect of the right atrium. The catheter is ready for immediate use. Electronically Signed   By: Simonne Come M.D.   On: 08/16/2022 16:37   DG Chest 2 View  Result Date: 08/08/2022 CLINICAL DATA:  [id number] with dyspnea. EXAM: CHEST - 2 VIEW COMPARISON:  Portable chest 07/17/2022, CT chest no contrast 03/17/2022 FINDINGS: The heart is moderately enlarged. Perihilar vascular congestion and edema are again noted. There is patchy airspace disease in the lung fields which could be airspace edema, pneumonia or combination. There are small layering pleural effusions. Mediastinum is normally outlined. There are degenerative changes and mild dextroscoliosis of the thoracic spine. Similar findings were noted on both prior studies. IMPRESSION: 1. Cardiomegaly with perihilar vascular congestion and edema. 2. Patchy airspace disease in the lung fields which could be airspace edema, pneumonia or combination. 3. Small layering pleural effusions. 4. Clinical correlation and radiographic follow-up recommended. Electronically Signed   By: Earlean Shawl.D.  On: 08/08/2022 21:18    Labs:  CBC: Recent Labs    08/16/22 2022 08/17/22 0741 08/18/22 0748 08/19/22 0049  WBC 1.3* 1.2* 1.5* 1.5*  HGB 7.0* 6.8* 6.7* 7.7*  HCT 22.3* 21.4* 20.8* 24.3*  PLT 58* 62* 62* 66*    COAGS: Recent Labs    12/08/21 1919  INR 1.3*    BMP: Recent Labs    08/16/22 2022 08/17/22 0741 08/18/22 0748 08/19/22 0049  NA 134* 136 136 135  K 3.4* 3.3* 3.2* 3.3*  CL 100 101 100 100  CO2 20* 23 23 22   GLUCOSE 146* 133* 145* 150*  BUN 111* 87* 65* 68*  CALCIUM 7.8* 7.8* 7.8* 7.8*  CREATININE 5.93* 4.94* 4.26* 4.17*  GFRNONAA 10* 13* 15* 15*    LIVER FUNCTION TESTS: Recent Labs    03/17/22 1440 03/18/22 0404 03/25/22 0935 07/17/22 1815 08/08/22 2028 08/14/22 0129 08/16/22 2022 08/17/22 0741 08/18/22 0748 08/19/22 0049  BILITOT 0.5 0.4  --  0.7 0.6  --   --   --   --   --   AST 16 10*  --  19 28  --   --   --   --   --   ALT 18 15  --  18 27  --   --   --   --   --   ALKPHOS 115 91  --  94 73  --   --   --   --   --   PROT 7.0 6.4*  --  8.2* 7.7  --   --   --   --   --   ALBUMIN 2.7* 2.5*   < > 3.1* 2.8*   < > 2.6* 2.5* 2.5* 2.5*   < > = values in this interval not displayed.    TUMOR MARKERS: No results for input(s): "AFPTM", "CEA", "CA199", "CHROMGRNA" in the last 8760 hours.  Assessment and Plan:  Scheduled today for Bone marrow biopsy Risks and benefits of bone marrow biopsy was discussed with the patient and/or patient's family including, but not limited to bleeding, infection, damage to adjacent structures or low yield requiring additional tests.  All of the questions were answered and there is agreement to proceed.  Consent signed and in chart.  Thank you for this interesting consult.  I greatly enjoyed meeting DIDIER BRANDENBURG and look forward to participating in their care.  A copy of this report was sent to the requesting provider on this date.  Electronically Signed: Robet Leu, PA-C 08/19/2022, 6:52  AM   I spent a total of 20 Minutes    in face to face in clinical consultation, greater than 50% of which was counseling/coordinating care for bone marrow biopsy

## 2022-08-20 ENCOUNTER — Inpatient Hospital Stay (HOSPITAL_COMMUNITY): Payer: Medicare (Managed Care)

## 2022-08-20 DIAGNOSIS — I10 Essential (primary) hypertension: Secondary | ICD-10-CM | POA: Diagnosis not present

## 2022-08-20 DIAGNOSIS — E1122 Type 2 diabetes mellitus with diabetic chronic kidney disease: Secondary | ICD-10-CM | POA: Diagnosis not present

## 2022-08-20 DIAGNOSIS — D61818 Other pancytopenia: Secondary | ICD-10-CM | POA: Diagnosis not present

## 2022-08-20 DIAGNOSIS — I5033 Acute on chronic diastolic (congestive) heart failure: Secondary | ICD-10-CM | POA: Diagnosis not present

## 2022-08-20 LAB — GLUCOSE, CAPILLARY
Glucose-Capillary: 142 mg/dL — ABNORMAL HIGH (ref 70–99)
Glucose-Capillary: 122 mg/dL — ABNORMAL HIGH (ref 70–99)
Glucose-Capillary: 110 mg/dL — ABNORMAL HIGH (ref 70–99)
Glucose-Capillary: 118 mg/dL — ABNORMAL HIGH (ref 70–99)

## 2022-08-20 LAB — RENAL FUNCTION PANEL
Albumin: 2.4 g/dL — ABNORMAL LOW (ref 3.5–5.0)
Anion gap: 10 (ref 5–15)
BUN: 45 mg/dL — ABNORMAL HIGH (ref 8–23)
CO2: 24 mmol/L (ref 22–32)
Calcium: 7.8 mg/dL — ABNORMAL LOW (ref 8.9–10.3)
Chloride: 99 mmol/L (ref 98–111)
Creatinine, Ser: 3.37 mg/dL — ABNORMAL HIGH (ref 0.61–1.24)
GFR, Estimated: 20 mL/min — ABNORMAL LOW (ref >60)
Glucose, Bld: 165 mg/dL — ABNORMAL HIGH (ref 70–99)
Phosphorus: 3.6 mg/dL (ref 2.5–4.6)
Potassium: 3.4 mmol/L — ABNORMAL LOW (ref 3.5–5.1)
Sodium: 133 mmol/L — ABNORMAL LOW (ref 135–145)

## 2022-08-20 LAB — CBC
HCT: 24 % — ABNORMAL LOW (ref 39.0–52.0)
Hemoglobin: 7.7 g/dL — ABNORMAL LOW (ref 13.0–17.0)
MCH: 28 pg (ref 26.0–34.0)
MCHC: 32.1 g/dL (ref 30.0–36.0)
MCV: 87.3 fL (ref 80.0–100.0)
Platelets: 71 10*3/uL — ABNORMAL LOW (ref 150–400)
RBC: 2.75 MIL/uL — ABNORMAL LOW (ref 4.22–5.81)
RDW: 16.8 % — ABNORMAL HIGH (ref 11.5–15.5)
WBC: 1.3 10*3/uL — CL (ref 4.0–10.5)
nRBC: 0 % (ref 0.0–0.2)

## 2022-08-20 LAB — MAGNESIUM: Magnesium: 1.9 mg/dL (ref 1.7–2.4)

## 2022-08-20 IMAGING — DX DG ABD PORTABLE 1V
2 series · 2 of 2 positions shown · non-contrast
Comparison: KUB 12/08/2020

CLINICAL DATA: Habanjar syndrome

EXAM:
PORTABLE ABDOMEN - 1 VIEW

[abdomen kub (1 of 2)]
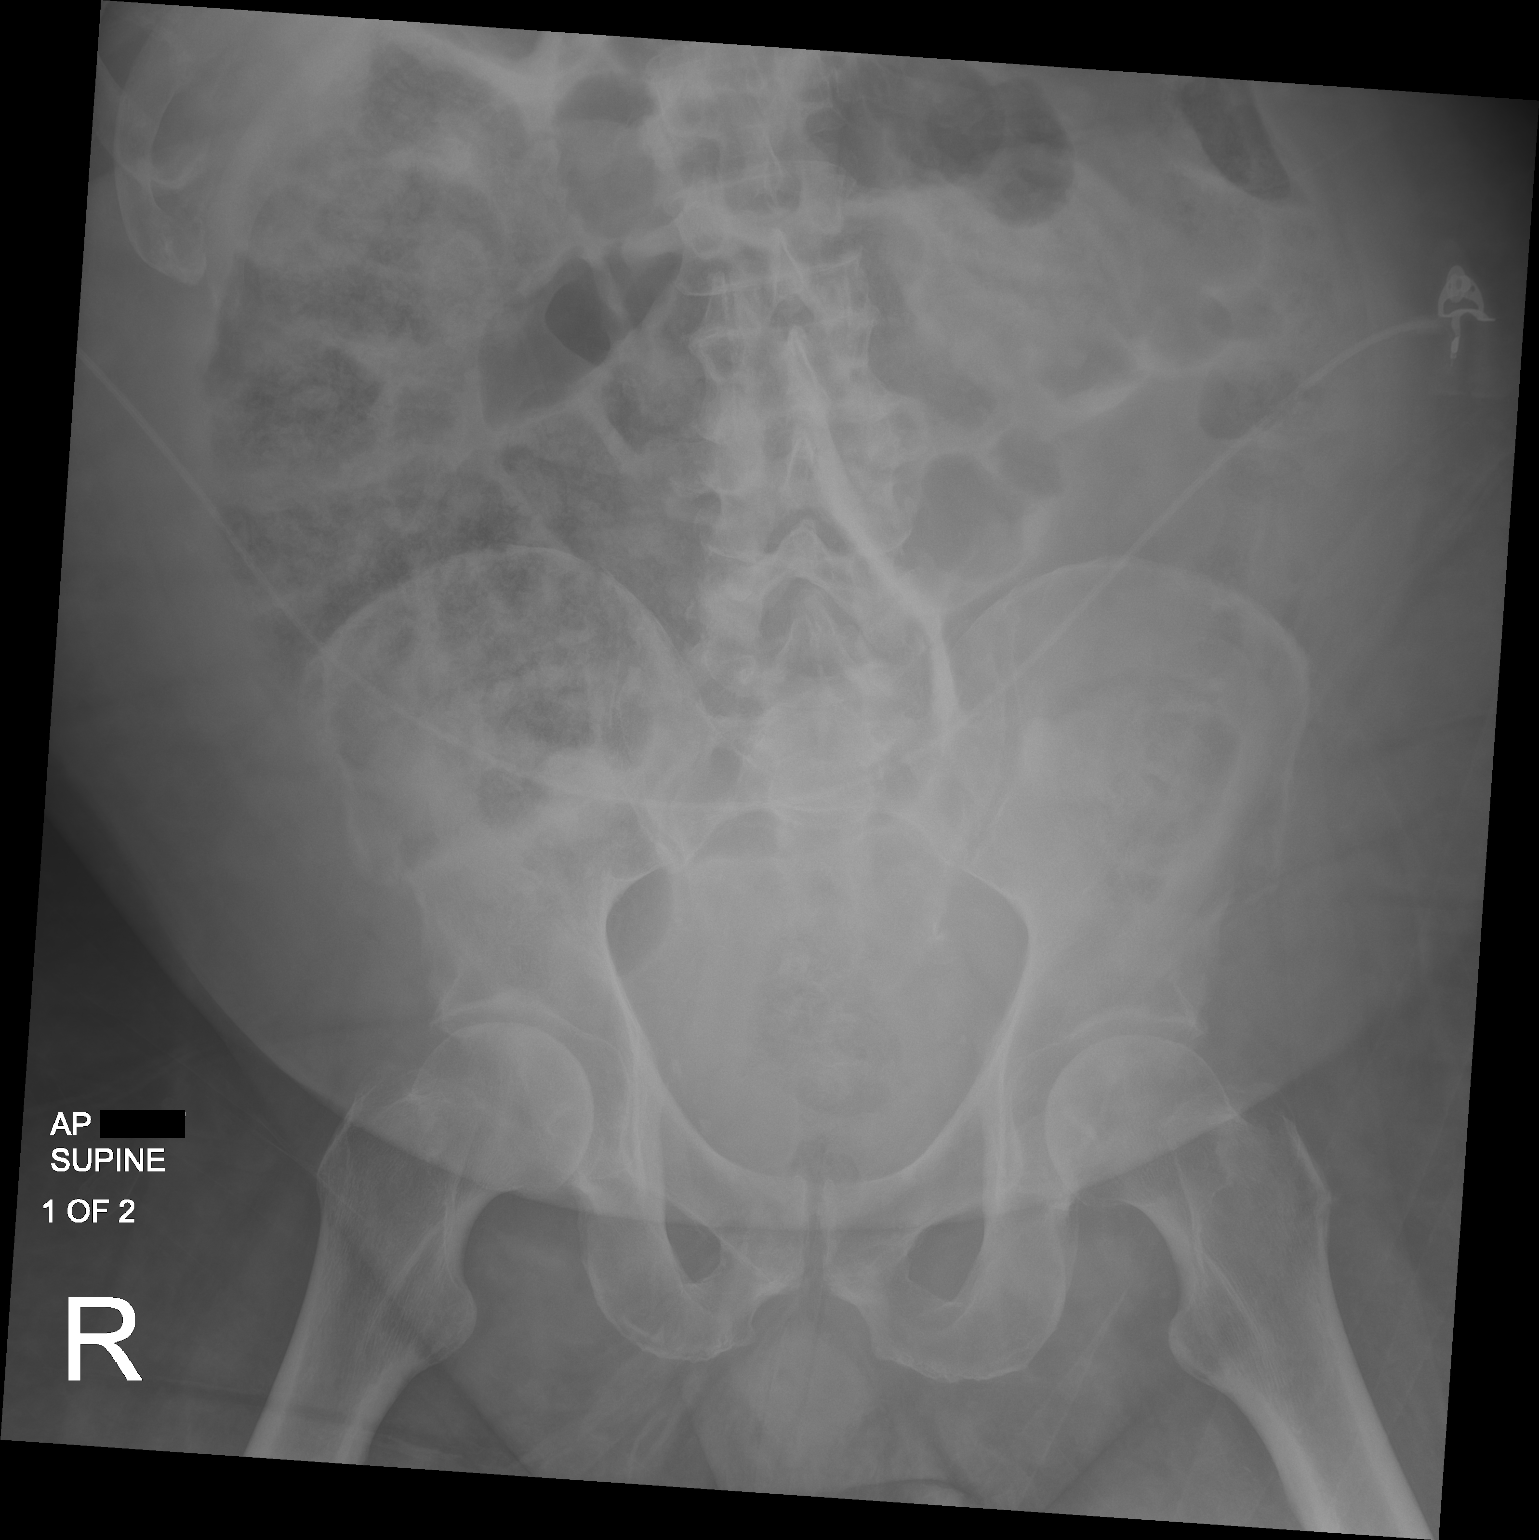

[abdomen kub (2 of 2)]
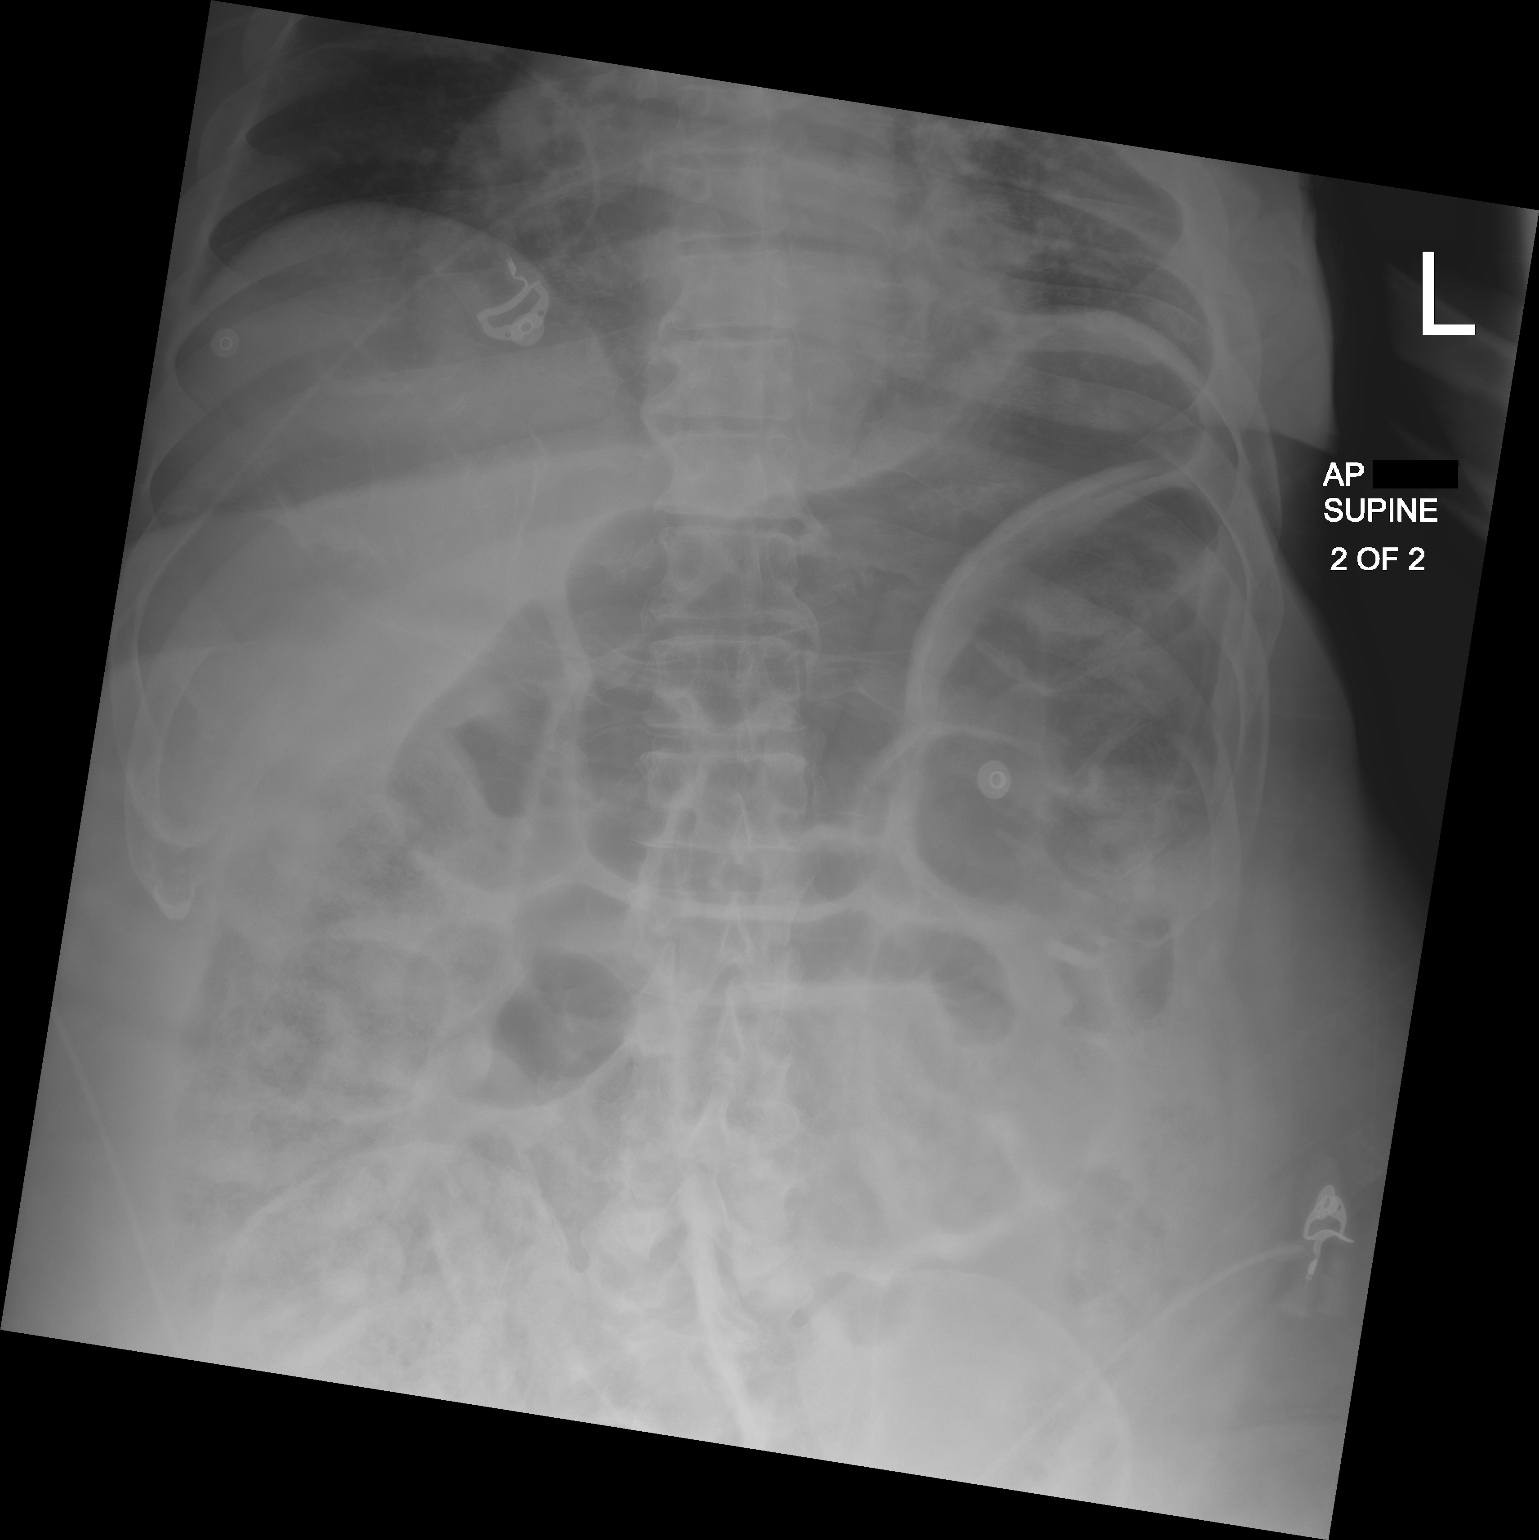

[2 of 2 positions shown; findings below may reference images not displayed]

FINDINGS: Compared to the study from 12/08/2020, gaseous distention of the
stomach has decreased. Mild gaseous distention of the bowel
throughout the remainder of the abdomen is overall not significantly
changed. A large stool burden in the right colon is again seen.
IMPRESSION: Decreased gaseous distention of the stomach since 12/08/2020.
Otherwise, overall no significant interval change in degree of
gaseous distension of the bowel throughout the remainder of the
abdomen. Unchanged large right colonic stool burden.

## 2022-08-20 MED ORDER — ONDANSETRON HCL 4 MG/2ML IJ SOLN
INTRAMUSCULAR | Status: AC
  Filled 2022-08-20: qty 2

## 2022-08-20 MED ORDER — FENTANYL CITRATE (PF) 100 MCG/2ML IJ SOLN
INTRAMUSCULAR | Status: AC
  Filled 2022-08-20: qty 2

## 2022-08-20 MED ORDER — MIDAZOLAM HCL 2 MG/2ML IJ SOLN
INTRAMUSCULAR | Status: AC
  Filled 2022-08-20: qty 2

## 2022-08-20 MED ORDER — ONDANSETRON HCL 4 MG/2ML IJ SOLN
INTRAMUSCULAR | Status: AC | PRN
  Administered 2022-08-20: 4 mg via INTRAVENOUS

## 2022-08-20 MED ORDER — FENTANYL CITRATE (PF) 100 MCG/2ML IJ SOLN
INTRAMUSCULAR | Status: AC | PRN
  Administered 2022-08-20: 50 ug via INTRAVENOUS

## 2022-08-20 NOTE — Progress Notes (Signed)
Pt refused CHG bath this am. He also refused to let nursing staff check if he is soiled underneath his sheets. Remained NPO except sips with meds after midnight for biopsy today.

## 2022-08-20 NOTE — Sedation Documentation (Signed)
Watts MD called to bedside r/t pt condition. Not able to tolerate prone position and medication. Airway at risk aeb desatting and emeisis.  Placed patient on simple face mask and high oxygen. Placed patient back in bed with head elevated. Tolerated interventions well.   Procedure aborted.

## 2022-08-20 NOTE — Progress Notes (Signed)
OT Cancellation Note  Patient Details Name: [name] MRN: [id number] DOB: Jul 17, 1960   Cancelled Treatment:    Reason Eval/Treat Not Completed: OT screened, no needs identified, will sign off. Pt is dependent in mobility and is functioning at his baseline in  ADLs, self feeds and grooms with set up. No acute OT needs.   Evern Bio 08/20/2022, 11:23 AM Berna Spare, OTR/L Acute Rehabilitation Services Office: [phone]

## 2022-08-20 NOTE — Progress Notes (Addendum)
PROGRESS NOTE  [name] RUE:[id number] DOB: September 26, 1960   PCP: [name], MD  Patient is from: SNF.  DOA: 08/08/2022 LOS: 12  Chief complaints Chief Complaint  Patient presents with   abnormal labs      Brief Narrative / Interim history:  62 y.o. male with a history of diabetes mellitus type 2, hypertension, CKD stage IV, PVD/left BKA, CVA, heart failure and pancytopenia.  Patient presented secondary to low hemoglobin.  He was transfused 1 unit with improvement in hemoglobin.  Hospitalization complicated by AKI on CKD-4 and acute on chronic CHF.  Nephrology consulted and started on Lasix drip.  AKI worse.  Initially felt to be not a good candidate for hemodialysis based on his functional status and refusal to HD.  Palliative consultation was recommended by nephrology.  However, he later changed his mind and agreed to have dialysis.  Had [facility] placed and HD started on 6/21.  Nephrology and palliative medicine following.  Oncology consulted and ordered bone marrow biopsy.  Unfortunately, bone marrow biopsy aborted on 6/25 due to orthopnea, desaturation, nausea and emesis.  IR recommends outpatient follow-up.  Has outpatient dialysis chair but has to tolerate HD in chair before discharge.  Nephrology following.   Subjective: Seen and examined earlier this afternoon.  Unfortunately, bone biopsy aborted due to orthopnea, desaturation, nausea and vomiting.  Currently denies nausea, vomiting or abdominal pain.  Denies shortness of breath.  Objective: Vitals:   08/20/22 0845 08/20/22 0850 08/20/22 0853 08/20/22 1100  BP:    139/75  Pulse: 70 69 69 69  Resp:    17  Temp:    98.5 F (36.9 C)  TempSrc:    Oral  SpO2: 96% 97% 99% 100%  Height:        Examination:  GENERAL: No apparent distress.  Sleepy. HEENT: MMM.  Vision and hearing grossly intact.  NECK: Supple.  No apparent JVD.  RESP:  No IWOB.  Fair aeration bilaterally. CVS:  RRR. Heart sounds normal.  ABD/GI/GU: BS+.  Abd soft, NTND.  MSK/EXT: Left BKA.  2+ RUE and RLE edema.  SKIN: RLE skin changes consistent with chronic venous insufficiency. NEURO: Sleepy but wakes to voice.  Oriented appropriately. PSYCH: Somewhat flat affect.  Procedures:  None  Microbiology summarized: None  Assessment and plan: Principal Problem:   Pancytopenia (HCC) Active Problems:   Acute renal failure superimposed on stage 4 chronic kidney disease (HCC)   Acute on chronic heart failure with preserved ejection fraction (HFpEF) (HCC)   Essential hypertension   History of CVA (cerebrovascular accident)   T2DM (type 2 diabetes mellitus) (HCC)   PAD (peripheral artery disease) (HCC)   Depression   Protein-calorie malnutrition, moderate (HCC)  Pancytopenia: Looks like chronic issue.  Anemia panel consistent with anemia of chronic disease.  No overt bleeding.  LDH elevated.  Haptoglobin within normal.  Transfused 1 unit with appropriate response on 6/23 -Nephrology started Aranesp and increasing dose. -Hematology recommended bone marrow biopsy which was aborted today (6/25) due to orthopnea, desaturation, nausea and vomiting.  IR recommends biopsy outpatient -Continue monitoring CBC   AKI on CKD stage IV/azotemia: Started HD on 6/22. Recent Labs    08/12/22 0121 08/13/22 0036 08/14/22 0129 08/15/22 0057 08/16/22 0102 08/16/22 2022 08/17/22 0741 08/18/22 0748 08/19/22 0049 08/20/22 0034  BUN 88* 97* 100* 106* 104* 111* 87* 65* 68* 45*  CREATININE 5.41* 5.58* 5.55* 5.48* 5.61* 5.93* 4.94* 4.26* 4.17* 3.37*  -TDC placed and HD started on 6/21. -Outpatient  HD chair at [facility] MWF at 12:20 PM but has to tolerate HD in chair before discharge -Nephrology following  Acute metabolic encephalopathy: Seems to have resolved. -Nephrology and palliative medicine following.   Acute on chronic diastolic heart failure: Difficult to manage with worsening renal function. -Fluid management by HD per nephrology  Urinary  retention: Foley catheter placed on 6/17. -Continue Foley catheter and Flomax.   Primary hypertension: Normotensive. -Increased Coreg to 25 mg twice daily on 6/24 -Increased hydralazine from 50 mg 3 times daily to 4 times daily on 6/24 -Continue amlodipine -May need more ultrafiltration   Controlled IDDM-2 with hyperglycemia and CKD-4: A1c 6.1%. Recent Labs  Lab 08/19/22 0614 08/19/22 1234 08/19/22 1620 08/19/22 2106 08/20/22 0559  GLUCAP 131* 134* 126* 178* 142*  -Continue current insulin regimen and CBG monitoring   Peripheral artery disease/left BKA -Continue Lipitor, aspirin -PT/OT  RUE swelling/lymphedema: Chronic.  Had multiple venous Dopplers that were negative for DVT in the past.   Depression -Continue sertraline  Chronic respiratory failure: On 2 L at home.  Desaturated to 80s when he went down for bone marrow biopsy.  Likely due to fluid. -Portable CXR -Fluid management per nephrology.  May need more ultrafiltration  Nausea and vomiting: Due to ESRD?  Abdominal exam benign.  No nausea or vomiting this afternoon. -Antiemetics as needed  Hypokalemia: Mild. -Per nephrology.   Goal of care: DNR/DNI.  Difficult to engage patient for goal of care discussion. -Palliative medicine following  Grief: Lost his brother 2 days prior to this hospitalization. -Appreciate emotional support by chaplain.  Morbid obesity/moderate malnutrition Body mass index is 42.1 kg/m. Nutrition Problem: Moderate Malnutrition Etiology: chronic illness (HF, PAD) Signs/Symptoms: mild fat depletion, mild muscle depletion, edema Interventions: Liberalize Diet, MVI, Nepro shake   DVT prophylaxis:  Place and maintain sequential compression device Start: 08/15/22 0726 SCDs Start: 08/08/22 2241  Code Status: DNR/DNI Family Communication: None at bedside. Level of care: Med-Surg Status is: Inpatient Remains inpatient appropriate because: Renal failure/acute on chronic  CHF/pancytopenia  Final disposition: Back to SNF once he tolerates HD in chair and cleared by consultants.  Consultants:  Nephrology Palliative medicine Hematology/oncology Interventional radiology  35 minutes with more than 50% spent in reviewing records, counseling patient/family and coordinating care.   Sch Meds:  Scheduled Meds:  amLODipine  10 mg Oral Daily   aspirin  81 mg Oral Daily   atorvastatin  80 mg Oral QHS   carvedilol  25 mg Oral BID WC   Chlorhexidine Gluconate Cloth  6 each Topical Q0600   [START ON 08/21/2022] darbepoetin (ARANESP) injection - NON-DIALYSIS  100 mcg Subcutaneous Q Wed-1800   feeding supplement (NEPRO CARB STEADY)  237 mL Oral BID BM   hydrALAZINE  50 mg Oral 4 times per day   insulin aspart  0-5 Units Subcutaneous QHS   insulin aspart  0-6 Units Subcutaneous TID WC   multivitamin  1 tablet Oral QHS   mouth rinse  15 mL Mouth Rinse 4 times per day   pantoprazole  40 mg Oral Daily   sertraline  50 mg Oral QODAY   sodium chloride flush  3 mL Intravenous Q12H   tamsulosin  0.4 mg Oral QPC supper   Continuous Infusions: PRN Meds:.acetaminophen **OR** acetaminophen, albuterol, ALPRAZolam, HYDROmorphone (DILAUDID) injection, ondansetron (ZOFRAN) IV, mouth rinse, mouth rinse, white petrolatum  Antimicrobials: Anti-infectives (From admission, onward)    Start     Dose/Rate Route Frequency Ordered Stop   08/16/22 1147  ceFAZolin (ANCEF)  IVPB 2g/100 mL premix        over 30 Minutes Intravenous Continuous PRN 08/16/22 1147 08/16/22 1147        I have personally reviewed the following labs and images: CBC: Recent Labs  Lab 08/17/22 0741 08/18/22 0748 08/19/22 0049 08/19/22 0733 08/20/22 0034  WBC 1.2* 1.5* 1.5* 1.6* 1.3*  NEUTROABS  --   --   --  0.6*  --   HGB 6.8* 6.7* 7.7* 7.9* 7.7*  HCT 21.4* 20.8* 24.3* 24.7* 24.0*  MCV 87.7 86.3 88.4 87.6 87.3  PLT 62* 62* 66* 69* 71*   BMP &GFR Recent Labs  Lab 08/15/22 0057 08/16/22 0102  08/16/22 2022 08/17/22 0741 08/18/22 0748 08/19/22 0049 08/20/22 0034  NA 135   < > 134* 136 136 135 133*  K 3.6   < > 3.4* 3.3* 3.2* 3.3* 3.4*  CL 101   < > 100 101 100 100 99  CO2 21*   < > 20* 23 23 22 24   GLUCOSE 166*   < > 146* 133* 145* 150* 165*  BUN 106*   < > 111* 87* 65* 68* 45*  CREATININE 5.48*   < > 5.93* 4.94* 4.26* 4.17* 3.37*  CALCIUM 7.7*   < > 7.8* 7.8* 7.8* 7.8* 7.8*  MG 2.0  --   --  2.1 1.9 2.1 1.9  PHOS 5.6*   < > 5.8* 4.8* 4.5 4.5 3.6   < > = values in this interval not displayed.   Estimated Creatinine Clearance: 29.7 mL/min (A) (by C-G formula based on SCr of 3.37 mg/dL (H)). Liver & Pancreas: Recent Labs  Lab 08/16/22 2022 08/17/22 0741 08/18/22 0748 08/19/22 0049 08/20/22 0034  ALBUMIN 2.6* 2.5* 2.5* 2.5* 2.4*   No results for input(s): "LIPASE", "AMYLASE" in the last 168 hours. No results for input(s): "AMMONIA" in the last 168 hours. Diabetic: No results for input(s): "HGBA1C" in the last 72 hours. Recent Labs  Lab 08/19/22 0614 08/19/22 1234 08/19/22 1620 08/19/22 2106 08/20/22 0559  GLUCAP 131* 134* 126* 178* 142*   Cardiac Enzymes: No results for input(s): "CKTOTAL", "CKMB", "CKMBINDEX", "TROPONINI" in the last 168 hours. No results for input(s): "PROBNP" in the last 8760 hours. Coagulation Profile: No results for input(s): "INR", "PROTIME" in the last 168 hours. Thyroid Function Tests: No results for input(s): "TSH", "T4TOTAL", "FREET4", "T3FREE", "THYROIDAB" in the last 72 hours. Lipid Profile: No results for input(s): "CHOL", "HDL", "LDLCALC", "TRIG", "CHOLHDL", "LDLDIRECT" in the last 72 hours. Anemia Panel: No results for input(s): "VITAMINB12", "FOLATE", "FERRITIN", "TIBC", "IRON", "RETICCTPCT" in the last 72 hours. Urine analysis:    Component Value Date/Time   COLORURINE YELLOW 08/09/2022 0530   APPEARANCEUR CLEAR 08/09/2022 0530   APPEARANCEUR Clear 12/31/2013 2116   LABSPEC 1.025 08/09/2022 0530   LABSPEC 1.032  12/31/2013 2116   PHURINE 5.5 08/09/2022 0530   GLUCOSEU NEGATIVE 08/09/2022 0530   GLUCOSEU >=500 12/31/2013 2116   HGBUR NEGATIVE 08/09/2022 0530   BILIRUBINUR NEGATIVE 08/09/2022 0530   BILIRUBINUR Negative 12/31/2013 2116   KETONESUR NEGATIVE 08/09/2022 0530   PROTEINUR 100 (A) 08/09/2022 0530   UROBILINOGEN 1.0 08/28/2013 1709   NITRITE NEGATIVE 08/09/2022 0530   LEUKOCYTESUR NEGATIVE 08/09/2022 0530   LEUKOCYTESUR Negative 12/31/2013 2116   Sepsis Labs: Invalid input(s): "PROCALCITONIN", "LACTICIDVEN"  Microbiology: No results found for this or any previous visit (from the past 240 hour(s)).  Radiology Studies: No results found.    Hamdi Vari T. Lexia Vandevender [facility]  If 7PM-7AM, please contact  night-coverage [email or web address] 08/20/2022, 3:25 PM

## 2022-08-20 NOTE — Progress Notes (Addendum)
[facility] Progress Note    Assessment/ Plan:   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  Patient has had multiple consultation with palliative care and discussed goals of care.  It seems like it was decided to pursue dialysis as per patient interest and monitor.  Based on the current functional status, deconditioning and challenges with mobility I am worried that he may not be a good candidate for outpatient dialysis.  He is nonoliguric but certainly no sign of renal recovery.   -s/p RIJ TDC placed by IR followed by dialysis on 6/21. Will plan for HD on MWF schedule -CLIP: [facility] MWF 12:20pm chair time.  However, we need to make sure that he can tolerate HD in chair before discharge. Still needs a lot of assistance to get out of bed (hoyer lift, 2+ assist) -can plan for permanent access however would prefer to have his pancytopenia to be evaluated first, I am concerned about him having a permanent access placed in the context of thrombocytopenia--may need to revisit this as an outpatient   Acute urinary retention: On Flomax and Foley catheter.   Pancytopenia/anemia: hemonc consulted for further evaluation.  Iron saturation 32% with high ferritin.  On ESA. Did not tolerate BM biopsy 6/25. Transfuse PRN for hgb <7   Acute on chronic diastolic CHF: Treated with IV diuretics initially.  Now managing volume with HD.   HTN/volume: Continue current antihypertensives, managing volume with HD.   Metabolic acidosis: Managed with dialysis, discontinued sodium bicarbonate., bicarb wnl   Hypokalemia: Dialyzing with high potassium bath.  DNR/DNI  Subjective:   Patient seen and examined bedside. No acute events. No complaints. Tolerated HD yesterday with net uf 2L Did not tolerated BM biopsy today, was not able to tolerate prone position therefore aborted   Objective:   BP 139/75 (BP Location: Left Arm)   Pulse 69   Temp 98.5 F (36.9 C)  (Oral)   Resp 17   Ht 5\' 8"  (1.727 m)   Wt 125.6 kg   SpO2 100%   BMI 42.10 kg/m   Intake/Output Summary (Last 24 hours) at 08/20/2022 1257 Last data filed at 08/20/2022 1058 Gross per 24 hour  Intake 717 ml  Output 775 ml  Net -58 ml   Weight change:   Physical Exam: Gen: NAD, sitting up in bed CVS: RRR Resp: CTA B/L Abd: soft, nt/nd Ext: woody edema RLE, left BKA Neuro: awake, following commands Dialysis access: RIJ TDC  Imaging: No results found.  Labs: BMET Recent Labs  Lab 08/15/22 0057 08/16/22 0102 08/16/22 2022 08/17/22 0741 08/18/22 0748 08/19/22 0049 08/20/22 0034  NA 135 135 134* 136 136 135 133*  K 3.6 3.4* 3.4* 3.3* 3.2* 3.3* 3.4*  CL 101 102 100 101 100 100 99  CO2 21* 21* 20* 23 23 22 24   GLUCOSE 166* 122* 146* 133* 145* 150* 165*  BUN 106* 104* 111* 87* 65* 68* 45*  CREATININE 5.48* 5.61* 5.93* 4.94* 4.26* 4.17* 3.37*  CALCIUM 7.7* 7.7* 7.8* 7.8* 7.8* 7.8* 7.8*  PHOS 5.6* 5.4* 5.8* 4.8* 4.5 4.5 3.6   CBC Recent Labs  Lab 08/18/22 0748 08/19/22 0049 08/19/22 0733 08/20/22 0034  WBC 1.5* 1.5* 1.6* 1.3*  NEUTROABS  --   --  0.6*  --   HGB 6.7* 7.7* 7.9* 7.7*  HCT 20.8* 24.3* 24.7* 24.0*  MCV 86.3 88.4 87.6 87.3  PLT 62* 66* 69* 71*    Medications:  amLODipine  10 mg Oral Daily   aspirin  81 mg Oral Daily   atorvastatin  80 mg Oral QHS   carvedilol  25 mg Oral BID WC   Chlorhexidine Gluconate Cloth  6 each Topical Q0600   [START ON 08/21/2022] darbepoetin (ARANESP) injection - NON-DIALYSIS  100 mcg Subcutaneous Q Wed-1800   feeding supplement (NEPRO CARB STEADY)  237 mL Oral BID BM   hydrALAZINE  50 mg Oral 4 times per day   insulin aspart  0-5 Units Subcutaneous QHS   insulin aspart  0-6 Units Subcutaneous TID WC   multivitamin  1 tablet Oral QHS   mouth rinse  15 mL Mouth Rinse 4 times per day   pantoprazole  40 mg Oral Daily   sertraline  50 mg Oral QODAY   sodium chloride flush  3 mL Intravenous Q12H   tamsulosin  0.4  mg Oral QPC supper      Roy Sar, MD [facility] 08/20/2022, 12:57 PM

## 2022-08-20 NOTE — Progress Notes (Signed)
   Unable to tolerate Bone marrow procedure in CT today Desatted; emesis  Procedure was not performed per Dr Grace Isaac  IR will cancel order--- please re order if/when pt can tolerate procedure

## 2022-08-20 NOTE — Progress Notes (Signed)
This chaplain is present for F/U spiritual care in the setting of reflective listening.   The chaplain is present with the Pt. as he remembers his childhood(3 siblings, mother, and father). The Pt. accepted the chaplain's invitation to explore and connect the storytelling with his healthcare at the next visit. The Pt. father's birthday is Sunday. The Pt. shares he is regularly talking to his sister.  The chaplain will try again to contact [facility] per the Pt. request. The Pt. expressed his uncertainty in what comes next after today's aborted procedure.  This chaplain is available for F/U spiritual care as needed.  Chaplain Stephanie Acre [phone]

## 2022-08-21 DIAGNOSIS — N179 Acute kidney failure, unspecified: Secondary | ICD-10-CM | POA: Diagnosis not present

## 2022-08-21 DIAGNOSIS — F32A Depression, unspecified: Secondary | ICD-10-CM

## 2022-08-21 DIAGNOSIS — D61818 Other pancytopenia: Secondary | ICD-10-CM | POA: Diagnosis not present

## 2022-08-21 DIAGNOSIS — Z8673 Personal history of transient ischemic attack (TIA), and cerebral infarction without residual deficits: Secondary | ICD-10-CM | POA: Diagnosis not present

## 2022-08-21 DIAGNOSIS — I10 Essential (primary) hypertension: Secondary | ICD-10-CM | POA: Diagnosis not present

## 2022-08-21 DIAGNOSIS — E440 Moderate protein-calorie malnutrition: Secondary | ICD-10-CM

## 2022-08-21 LAB — CBC
HCT: 23.8 % — ABNORMAL LOW (ref 39.0–52.0)
Hemoglobin: 7.7 g/dL — ABNORMAL LOW (ref 13.0–17.0)
MCH: 29.1 pg (ref 26.0–34.0)
MCHC: 32.4 g/dL (ref 30.0–36.0)
MCV: 89.8 fL (ref 80.0–100.0)
Platelets: 78 10*3/uL — ABNORMAL LOW (ref 150–400)
RBC: 2.65 MIL/uL — ABNORMAL LOW (ref 4.22–5.81)
RDW: 16.6 % — ABNORMAL HIGH (ref 11.5–15.5)
WBC: 1.4 10*3/uL — CL (ref 4.0–10.5)
nRBC: 0 % (ref 0.0–0.2)

## 2022-08-21 LAB — RENAL FUNCTION PANEL
Albumin: 2.3 g/dL — ABNORMAL LOW (ref 3.5–5.0)
Anion gap: 14 (ref 5–15)
BUN: 50 mg/dL — ABNORMAL HIGH (ref 8–23)
CO2: 24 mmol/L (ref 22–32)
Calcium: 8.1 mg/dL — ABNORMAL LOW (ref 8.9–10.3)
Chloride: 99 mmol/L (ref 98–111)
Creatinine, Ser: 3.74 mg/dL — ABNORMAL HIGH (ref 0.61–1.24)
GFR, Estimated: 18 mL/min — ABNORMAL LOW (ref >60)
Glucose, Bld: 132 mg/dL — ABNORMAL HIGH (ref 70–99)
Phosphorus: 4.2 mg/dL (ref 2.5–4.6)
Potassium: 3.2 mmol/L — ABNORMAL LOW (ref 3.5–5.1)
Sodium: 137 mmol/L (ref 135–145)

## 2022-08-21 LAB — GLUCOSE, CAPILLARY
Glucose-Capillary: 140 mg/dL — ABNORMAL HIGH (ref 70–99)
Glucose-Capillary: 106 mg/dL — ABNORMAL HIGH (ref 70–99)
Glucose-Capillary: 124 mg/dL — ABNORMAL HIGH (ref 70–99)
Glucose-Capillary: 131 mg/dL — ABNORMAL HIGH (ref 70–99)

## 2022-08-21 LAB — MAGNESIUM: Magnesium: 1.8 mg/dL (ref 1.7–2.4)

## 2022-08-21 MED ORDER — HEPARIN SODIUM (PORCINE) 1000 UNIT/ML IJ SOLN
3800.0000 [IU] | Freq: Once | INTRAMUSCULAR | Status: AC
  Administered 2022-08-21: 3800 [IU]
  Filled 2022-08-21: qty 4

## 2022-08-21 NOTE — Progress Notes (Signed)
Progress Note   Patient: [name] JXB:[id number] DOB: 1960-06-01 DOA: 08/08/2022     13 DOS: the patient was seen and examined on 08/21/2022   Brief hospital course: [name] was admitted to the hospital with the working diagnosis of symptomatic anemia.   62 yo male with the past medical history of hypertension, T2DM, CKD stage IV, peripheral vascular disease, history of CVA and chronic heart failure, who presented from SNF for evaluation of low hgb. Recent hospitalization 05/22 to 05/25 for heart failure exacerbation, and anemia with positive stool occult blood test. Required 3 units PRBC transfusion. No invasive procedure required. Follow up hgb was low and patient was referred to the ED for further evaluation. On his initial physical examination his blood pressure was 144/87, HR 69, RR 18 and 02 saturation 97%, lungs with bilateral rales with no wheezing, heart with S1 and S2 present and rhythmic, no abdominal distention and positive left stump pitting edema.   Na 133 K 3,8 CL 102 bicarbonate 20, glucose 131 BUN 80 cr 5,26  BNP 498  High sensitive troponin 31  Iron 72, transferrin saturation 32, ferritin 628.  Wbc 1,9, hgb 6,9 plt 57   Transfused one unit PRBC with follow up hgb at 7,6  Positive urinary retention, required in and out catheterization.   06/15 continue to have hypervolemia. Further drop in Hgb down to 6,8 and had PRBC transfusion.  06/16 patient with urinary retention but has refused to Foley catheter.   Had [facility] placed and HD started on 6/21.  Nephrology and palliative medicine following.  Oncology consulted and ordered bone marrow biopsy.  Unfortunately, bone marrow biopsy aborted on 6/25 due to orthopnea, desaturation, nausea and emesis.  IR recommends outpatient follow-up.  Has outpatient dialysis chair but has to tolerate HD in chair before discharge.   Assessment and Plan: * Pancytopenia (HCC) Symptomatic anemia.  Recent GI evaluation, recommended conservative  management.   Sp PRBC transfusion for a total of 3 since admission.   Possible bone marrow failure considering leukopenia and thrombocytopenia. Poor prognosis.  Not able to perform bone marrow biopsy due to patient not able to stay flat and prone for procedure.  Continue conservative therapy with EPO and as needed PRBC transfusion.   Today wbc 1.4 hgb 7.7 and plt 78   Acute renal failure superimposed on stage 4 chronic kidney disease (HCC) Hypokalemia. Hyponatremia.   Possible multifactorial.  TDC placed on 06/21.  Patient had HD today with good toleration.  He continue to have significant edema at his lower extremities. He has a poor physical functional status.   K today is 3,2 with bicarbonate of 24.  BUN 50.   Plan to continue with HD as tolerated.   Acute on chronic heart failure with preserved ejection fraction (HFpEF) (HCC) Echocardiogram with preserved LV systolic function. EF 50 to 55%. Moderate LVH, RV systolic function preserved. Mild aortic valve stenosis.   Persistent edema bilateral lower extremities, Blood pressure control with amlodipine, carvedilol and hydralazine.   Continue ultrafiltration with HD.   Essential hypertension Continue blood pressure control with carvedilol, amlodipine and hydralazine.   History of CVA (cerebrovascular accident) Continue supportive medical therapy. PT and OT. Patient will return to SNF when medically stable.   T2DM (type 2 diabetes mellitus) (HCC) Continue insulin sliding scale for glucose cover and monitoring. Fasting glucose this am 132.  Continue to hold on basal insulin.   PAD (peripheral artery disease) (HCC) Left BKA.   Depression Continue with PRN  alprazolam.  Patient requested to discontinue sertraline due to hallucinations.   Protein-calorie malnutrition, moderate (HCC) Continue with nutritional supplementation.         Subjective: Patient continue very weak and deconditioned, he had HD today.  Continue to have edema.   Physical Exam: Vitals:   08/21/22 1130 08/21/22 1200 08/21/22 1217 08/21/22 1300  BP: (!) 175/86 (!) 169/81 (!) 166/85 (!) 173/87  Pulse: 71 71 71 72  Resp: 12 (!) 9 13 16   Temp:   98.3 F (36.8 C)   TempSrc:   Oral   SpO2: 100% 100% 100%   Height:       Neurology awake and alert ENT with mild pallor Cardiovascular with S1 and S2 present and rhythmic with no gallops or murmurs No JVD Respiratory with scattered rales with no wheezing Abdomen with no distention  Positive right lower extremity edema ++ and left stump edema ++   Data Reviewed:    Family Communication: no family at the bedside   Disposition: Status is: Inpatient Remains inpatient appropriate because: renal replacement therapy   Planned Discharge Destination: SNF    Author: Coralie Keens, MD 08/21/2022 3:48 PM  For on call review [email or web address].

## 2022-08-21 NOTE — Progress Notes (Signed)
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  Patient has had multiple consultation with palliative care and discussed goals of care.  It seems like it was decided to pursue dialysis as per patient interest and monitor.  Based on the current functional status, deconditioning and challenges with mobility I am worried that he may not be a good candidate for outpatient dialysis.  He is nonoliguric but certainly no sign of renal recovery.   -s/p RIJ TDC placed by IR followed by dialysis on 6/21. Will plan for HD on MWF schedule -CLIP: [facility] MWF 12:20pm chair time.  Tolerating HD in recliner 6/26. Still needs a lot of assistance to get out of bed (hoyer lift, 2+ assist) -can plan for permanent access however would prefer to have his pancytopenia to be evaluated first, I am concerned about him having a permanent access placed in the context of thrombocytopenia--may need to revisit this as an outpatient   Acute urinary retention: On Flomax and Foley catheter.   Pancytopenia/anemia: hemonc consulted for further evaluation.  Iron saturation 32% with high ferritin.  On ESA. Did not tolerate BM biopsy 6/25. Transfuse PRN for hgb <7   Acute on chronic diastolic CHF: Treated with IV diuretics initially.  Now managing volume with HD.   HTN/volume: Continue current antihypertensives, managing volume with HD.   Metabolic acidosis: Managed with dialysis, discontinued sodium bicarbonate., bicarb wnl   Hypokalemia: Dialyzing with high potassium bath.  DNR/DNI  Subjective:   Patient seen and examined on dialysis. Doing dialysis in chair which he is not having any issues with. No complaints, tolerating treatment   Objective:   BP (!) 167/99 (BP Location: Left Wrist)   Pulse 72   Temp 98.5 F (36.9 C) (Oral)   Resp 14   Ht 5\' 8"  (1.727 m)   Wt 125.6 kg   SpO2 100%   BMI 42.10 kg/m   Intake/Output  Summary (Last 24 hours) at 08/21/2022 0947 Last data filed at 08/21/2022 0510 Gross per 24 hour  Intake 540 ml  Output 950 ml  Net -410 ml   Weight change:   Physical Exam: Gen: NAD, in recliner CVS: RRR Resp: CTA B/L Abd: soft, nt/nd Ext: woody edema RLE, left BKA Neuro: awake, following commands Dialysis access: RIJ [facility]  Imaging: DG Chest Port 1 View  Result Date: 08/20/2022 CLINICAL DATA:  Hypoxemia. EXAM: PORTABLE CHEST 1 VIEW COMPARISON:  Chest radiographs 08/08/2022, 07/17/2022, 03/23/2022 FINDINGS: New right internal jugular dual-lumen central venous catheter tips overlie the mid height of the right atrium. Cardiac silhouette is again moderately enlarged. Mediastinal contours within normal limits. Mildly decreased lung volumes. Mild bibasilar bronchovascular crowding. Cephalization of the pulmonary vasculature without overt pulmonary edema. Improved aeration with resolution of the prior bilateral patchy airspace opacities and interstitial thickening. No pleural effusion or pneumothorax. No acute skeletal abnormality. IMPRESSION: 1. Cardiomegaly with cephalization of the pulmonary vasculature as can be seen with pulmonary venous hypertension. No overt pulmonary edema. 2. Improved aeration with resolution of the prior bilateral patchy airspace opacities and interstitial thickening. This appears to reflect interval improvement of the prior interstitial and alveolar pulmonary edema. Electronically Signed   By: Neita Garnet M.D.   On: 08/20/2022 18:50    Labs: [employer] Recent Labs  Lab 08/16/22 0102 08/16/22 2022 08/17/22 0741 08/18/22 0748 08/19/22 0049 08/20/22 0034 08/21/22 0635  NA 135 134* 136 136  135 133* 137  K 3.4* 3.4* 3.3* 3.2* 3.3* 3.4* 3.2*  CL 102 100 101 100 100 99 99  CO2 21* 20* 23 23 22 24 24   GLUCOSE 122* 146* 133* 145* 150* 165* 132*  BUN 104* 111* 87* 65* 68* 45* 50*  CREATININE 5.61* 5.93* 4.94* 4.26* 4.17* 3.37* 3.74*  CALCIUM 7.7* 7.8* 7.8* 7.8* 7.8* 7.8*  8.1*  PHOS 5.4* 5.8* 4.8* 4.5 4.5 3.6 4.2   CBC Recent Labs  Lab 08/19/22 0049 08/19/22 0733 08/20/22 0034 08/21/22 0635  WBC 1.5* 1.6* 1.3* 1.4*  NEUTROABS  --  0.6*  --   --   HGB 7.7* 7.9* 7.7* 7.7*  HCT 24.3* 24.7* 24.0* 23.8*  MCV 88.4 87.6 87.3 89.8  PLT 66* 69* 71* 78*    Medications:     amLODipine  10 mg Oral Daily   aspirin  81 mg Oral Daily   atorvastatin  80 mg Oral QHS   carvedilol  25 mg Oral BID WC   Chlorhexidine Gluconate Cloth  6 each Topical Q0600   darbepoetin (ARANESP) injection - NON-DIALYSIS  100 mcg Subcutaneous Q Wed-1800   feeding supplement (NEPRO CARB STEADY)  237 mL Oral BID BM   hydrALAZINE  50 mg Oral 4 times per day   insulin aspart  0-5 Units Subcutaneous QHS   insulin aspart  0-6 Units Subcutaneous TID WC   multivitamin  1 tablet Oral QHS   mouth rinse  15 mL Mouth Rinse 4 times per day   pantoprazole  40 mg Oral Daily   sertraline  50 mg Oral QODAY   sodium chloride flush  3 mL Intravenous Q12H   tamsulosin  0.4 mg Oral QPC supper      [name], MD [facility] 08/21/2022, 9:47 AM

## 2022-08-21 NOTE — Progress Notes (Signed)
Patient is not seen.  The patient could not tolerate bone marrow biopsy.  At this point in time, there is nothing I can offer without knowing what caused his pancytopenia.  He will continue transfusions and ESA as directed by nephrologist. In the absence of sepsis, he does not need G-CSF support There is no contraindication for him to proceed with anticoagulation therapy as long as his platelet count is over 50,000. I will sign off.

## 2022-08-21 NOTE — Progress Notes (Signed)
Pt refused CHG bath again this morning. Pt also refused insertion of flexi-seal for frequent episodes of liquid/watery stool. This RN and NT attempted to lift blankets to check if pt is soiled. Pt put his arm out and said he wanted to be left alone. This RN able to see pt is indeed soiled under blankets. Educated pt regarding skin breakdown and risk of infection. Pt repeated he isn't going to "do that right now" and leave him alone.

## 2022-08-21 NOTE — Progress Notes (Signed)
Received patient in bed to unit.  Alert and oriented.  Informed consent signed and in chart.   TX duration:3.5  Patient tolerated well.  Transported back to the room  Alert, without acute distress.  Hand-off given to patient'Roy nurse.   Access used: RIGHT [facility] Access issues: NONE  Total UF removed: 3L Medication(Roy) given: TYELNOL   08/21/22 1217  Vitals  Temp 98.3 F (36.8 C)  Temp Source Oral  BP (!) 166/85  MAP (mmHg) 109  BP Location Left Wrist  BP Method Automatic  Patient Position (if appropriate) Lying  Pulse Rate 71  Pulse Rate Source Monitor  ECG Heart Rate 72  Resp 13  Oxygen Therapy  SpO2 100 %  O2 Device Nasal Cannula  O2 Flow Rate (L/min) 3 L/min  During Treatment Monitoring  HD Safety Checks Performed Yes  Intra-Hemodialysis Comments Tx completed;Tolerated well  Dialysis Fluid Bolus Normal Saline  Bolus Amount (mL) 300 mL  Post Treatment  Dialyzer Clearance Lightly streaked  Duration of HD Treatment -hour(Roy) 3.5 hour(Roy)  Liters Processed 84  Fluid Removed (mL) 3000 mL  Hemodialysis Catheter Right Internal jugular Double lumen Permanent (Tunneled)  Placement Date/Time: 08/16/22 1201   Serial / Lot #: [id number]  Expiration Date: 07/24/26  Time Out: Correct patient;Correct site;Correct procedure  Maximum sterile barrier precautions: Hand hygiene;Cap;Mask;Sterile gown;Sterile gloves;Large sterile ...  Site Condition No complications  Blue Lumen Status Flushed;Heparin locked  Red Lumen Status Flushed;Heparin locked  Purple Lumen Status N/A  Catheter fill solution Heparin 1000 units/ml  Catheter fill volume (Arterial) 1.9 cc  Catheter fill volume (Venous) 1.9  Dressing Type Transparent  Dressing Status Antimicrobial disc in place;Clean, Dry, Intact  Drainage Description None  Dressing Change Due 08/28/22  Post treatment catheter status Capped and Clamped      [name] [name]

## 2022-08-21 NOTE — Assessment & Plan Note (Addendum)
Continue with nutritional supplementation.   Intermittent nausea and vomiting, will continue with PRN zofran for symptom control.

## 2022-08-21 NOTE — Progress Notes (Signed)
This chaplain left a second VM for Rev. Dr. Orlin Hilding per the Pt. request. The chaplain understands the Pt. and Pt. family has a longstanding relationship with Dr. Manson Passey and hopes for a visit.  Chaplain Stephanie Acre [phone]

## 2022-08-22 DIAGNOSIS — Z8673 Personal history of transient ischemic attack (TIA), and cerebral infarction without residual deficits: Secondary | ICD-10-CM | POA: Diagnosis not present

## 2022-08-22 DIAGNOSIS — N179 Acute kidney failure, unspecified: Secondary | ICD-10-CM | POA: Diagnosis not present

## 2022-08-22 DIAGNOSIS — D61818 Other pancytopenia: Secondary | ICD-10-CM | POA: Diagnosis not present

## 2022-08-22 DIAGNOSIS — I10 Essential (primary) hypertension: Secondary | ICD-10-CM | POA: Diagnosis not present

## 2022-08-22 LAB — GLUCOSE, CAPILLARY
Glucose-Capillary: 132 mg/dL — ABNORMAL HIGH (ref 70–99)
Glucose-Capillary: 121 mg/dL — ABNORMAL HIGH (ref 70–99)
Glucose-Capillary: 127 mg/dL — ABNORMAL HIGH (ref 70–99)
Glucose-Capillary: 130 mg/dL — ABNORMAL HIGH (ref 70–99)

## 2022-08-22 MED ORDER — LOPERAMIDE HCL 2 MG PO CAPS
2.0000 mg | ORAL_CAPSULE | ORAL | Status: DC | PRN
  Administered 2022-08-22: 2 mg via ORAL
  Filled 2022-08-22: qty 1

## 2022-08-22 NOTE — Progress Notes (Signed)
Pt has tolerated all meds/food/fluids No s/s or c/o sob/dyspnea to note while on O2 via [place] Pt has been repositioned and given CHG bath as well as foley and peri care/ He was weighed and bed changed He has no s/s or c/o pain or discomfort to note at this time He has refused to allow lab to draw his morning labs, I have educated the patient about refusing care but he still refused I was told that they would come back and reattempt Pt has refused care and repositioning throughout the night, but he tolerated his bath and repositioning fairly well afterwards Pt has callbell/belongings are within reach Bed in proper position for safety  Will continue to monitor

## 2022-08-22 NOTE — Progress Notes (Signed)
Pt tolerated HD in chair on 6/26. Met with pt at bedside this morning to provide pt with details regarding out-pt HD arrangements. Schedule letter provided to pt as well and left at bedside. Pt agreeable to plan. Arrangements added to AVS. Contacted team this morning to inquire about possible d/c date so clinic can be provided an update regarding pt's start date. Awaiting a response. Will assist as needed.   Olivia Canter Renal Navigator [phone]

## 2022-08-22 NOTE — Progress Notes (Signed)
[facility] Progress Note    Assessment/ Plan:   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  Patient has had multiple consultation with palliative care and discussed goals of care.  It seems like it was decided to pursue dialysis as per patient interest and monitor.  Based on the current functional status, deconditioning and challenges with mobility I am worried that he may not be a good candidate for outpatient dialysis.  He is nonoliguric but certainly no sign of renal recovery.   -s/p RIJ TDC placed by IR followed by dialysis on 6/21. Will plan for HD on MWF schedule -CLIP: [facility] MWF 12:20pm chair time.  Tolerated HD in recliner 6/26. Still needs a lot of assistance to get out of bed (hoyer lift, 2+ assist). Will c/w HD in recliner -can plan for permanent access however would prefer to have his pancytopenia to be evaluated first, I am concerned about him having a permanent access placed in the context of thrombocytopenia--may need to revisit this as an outpatient   Acute urinary retention: On Flomax and Foley catheter.   Pancytopenia/anemia: hemonc consulted for further evaluation.  Iron saturation 32% with high ferritin.  On ESA. Did not tolerate BM biopsy 6/25. Transfuse PRN for hgb <7   Acute on chronic diastolic CHF: Treated with IV diuretics initially.  Now managing volume with HD.   HTN/volume: Continue current antihypertensives, managing volume with HD.   Metabolic acidosis: Managed with dialysis, discontinued sodium bicarbonate., bicarb wnl   Hypokalemia: Dialyzing with high potassium bath.  DNR/DNI  Subjective:   Patient seen and examined bedside, no acute events. Refusing care as per RN charting. Tolerated HD in recliner 6/26. Net UF 3L   Objective:   BP (!) 164/90 (BP Location: Left Wrist)   Pulse 71   Temp 98.4 F (36.9 C) (Oral)   Resp 16   Ht 5\' 8"  (1.727 m)   Wt 116.6 kg   SpO2 100%   BMI 39.09 kg/m    Intake/Output Summary (Last 24 hours) at 08/22/2022 1610 Last data filed at 08/22/2022 0445 Gross per 24 hour  Intake --  Output 3700 ml  Net -3700 ml   Weight change:   Physical Exam: Gen: NAD CVS: RRR Resp: CTA B/L Abd: soft, nt/nd Ext: woody pitting edema RLE, left BKA (stump edema) Neuro: awake, following commands Dialysis access: RIJ [facility]  Imaging: DG Chest Port 1 View  Result Date: 08/20/2022 CLINICAL DATA:  Hypoxemia. EXAM: PORTABLE CHEST 1 VIEW COMPARISON:  Chest radiographs 08/08/2022, 07/17/2022, 03/23/2022 FINDINGS: New right internal jugular dual-lumen central venous catheter tips overlie the mid height of the right atrium. Cardiac silhouette is again moderately enlarged. Mediastinal contours within normal limits. Mildly decreased lung volumes. Mild bibasilar bronchovascular crowding. Cephalization of the pulmonary vasculature without overt pulmonary edema. Improved aeration with resolution of the prior bilateral patchy airspace opacities and interstitial thickening. No pleural effusion or pneumothorax. No acute skeletal abnormality. IMPRESSION: 1. Cardiomegaly with cephalization of the pulmonary vasculature as can be seen with pulmonary venous hypertension. No overt pulmonary edema. 2. Improved aeration with resolution of the prior bilateral patchy airspace opacities and interstitial thickening. This appears to reflect interval improvement of the prior interstitial and alveolar pulmonary edema. Electronically Signed   By: Neita Garnet M.D.   On: 08/20/2022 18:50    Labs: [employer] Recent [employer] 08/16/22 0102 08/16/22 2022 08/17/22 9604 08/18/22 5409 08/19/22 0049 08/20/22 0034 08/21/22 8119  NA 135 134* 136 136 135 133* 137  K 3.4* 3.4* 3.3* 3.2* 3.3* 3.4* 3.2*  CL 102 100 101 100 100 99 99  CO2 21* 20* 23 23 22 24 24   GLUCOSE 122* 146* 133* 145* 150* 165* 132*  BUN 104* 111* 87* 65* 68* 45* 50*  CREATININE 5.61* 5.93* 4.94* 4.26* 4.17* 3.37* 3.74*  CALCIUM 7.7*  7.8* 7.8* 7.8* 7.8* 7.8* 8.1*  PHOS 5.4* 5.8* 4.8* 4.5 4.5 3.6 4.2   CBC Recent Labs  Lab 08/19/22 0049 08/19/22 0733 08/20/22 0034 08/21/22 0635  WBC 1.5* 1.6* 1.3* 1.4*  NEUTROABS  --  0.6*  --   --   HGB 7.7* 7.9* 7.7* 7.7*  HCT 24.3* 24.7* 24.0* 23.8*  MCV 88.4 87.6 87.3 89.8  PLT 66* 69* 71* 78*    Medications:     amLODipine  10 mg Oral Daily   aspirin  81 mg Oral Daily   atorvastatin  80 mg Oral QHS   carvedilol  25 mg Oral BID WC   Chlorhexidine Gluconate Cloth  6 each Topical Q0600   darbepoetin (ARANESP) injection - NON-DIALYSIS  100 mcg Subcutaneous Q Wed-1800   feeding supplement (NEPRO CARB STEADY)  237 mL Oral BID BM   hydrALAZINE  50 mg Oral 4 times per day   insulin aspart  0-5 Units Subcutaneous QHS   insulin aspart  0-6 Units Subcutaneous TID WC   multivitamin  1 tablet Oral QHS   mouth rinse  15 mL Mouth Rinse 4 times per day   pantoprazole  40 mg Oral Daily   sodium chloride flush  3 mL Intravenous Q12H   tamsulosin  0.4 mg Oral QPC supper      [name], MD [facility] 08/22/2022, 8:26 AM

## 2022-08-22 NOTE — Progress Notes (Addendum)
Progress Note   Patient: [name]:[id number] DOB: November 14, 1960 DOA: 08/08/2022     14 DOS: the patient was seen and examined on 08/22/2022   Brief hospital course: [name] was admitted to the hospital with the working diagnosis of symptomatic anemia.   62 yo male with the past medical history of hypertension, T2DM, CKD stage IV, peripheral vascular disease, history of CVA and chronic heart failure, who presented from SNF for evaluation of low hgb. Recent hospitalization 05/22 to 05/25 for heart failure exacerbation, and anemia with positive stool occult blood test. Required 3 units PRBC transfusion. No invasive procedure required. Follow up hgb was low and patient was referred to the ED for further evaluation. On his initial physical examination his blood pressure was 144/87, HR 69, RR 18 and 02 saturation 97%, lungs with bilateral rales with no wheezing, heart with S1 and S2 present and rhythmic, no abdominal distention and positive left stump pitting edema.   Na 133 K 3,8 CL 102 bicarbonate 20, glucose 131 BUN 80 cr 5,26  BNP 498  High sensitive troponin 31  Iron 72, transferrin saturation 32, ferritin 628.  Wbc 1,9, hgb 6,9 plt 57   Transfused one unit PRBC with follow up hgb at 7,6  Positive urinary retention, required in and out catheterization.   06/15 continue to have hypervolemia. Further drop in Hgb down to 6,8 and had PRBC transfusion.  06/16 patient with urinary retention but has refused to Foley catheter.   Had [facility] placed and HD started on 6/21.  Nephrology and palliative medicine following.  Oncology consulted and ordered bone marrow biopsy.  Unfortunately, bone marrow biopsy aborted on 6/25 due to orthopnea, desaturation, nausea and emesis.  IR recommends outpatient follow-up.  Has outpatient dialysis chair but has to tolerate HD in chair before discharge.   Assessment and Plan: * Pancytopenia (HCC) Symptomatic anemia.  Recent GI evaluation, recommended conservative  management.   Sp PRBC transfusion for a total of 3 since admission.   Possible bone marrow failure considering leukopenia and thrombocytopenia. Poor prognosis.  Not able to perform bone marrow biopsy due to patient not able to stay flat and prone for procedure.  Continue conservative therapy with EPO and as needed PRBC transfusion.   Follow cell count in am.   Acute renal failure superimposed on stage 4 chronic kidney disease (HCC) Hypokalemia. Hyponatremia.   Possible multifactorial.  TDC placed on 06/21.  Continue intermittent renal replacement therapy with ultrafiltration.   His edema is improving.   Check renal panel in am.  Acute on chronic heart failure with preserved ejection fraction (HFpEF) (HCC) Echocardiogram with preserved LV systolic function. EF 50 to 55%. Moderate LVH, RV systolic function preserved. Mild aortic valve stenosis.   Blood pressure control with amlodipine, carvedilol and hydralazine.  Edema is improving.  Continue ultrafiltration with HD.   Essential hypertension Continue blood pressure control with carvedilol, amlodipine and hydralazine.   History of CVA (cerebrovascular accident) Continue supportive medical therapy. PT and OT. Patient will return to SNF when medically stable.   T2DM (type 2 diabetes mellitus) (HCC) Continue insulin sliding scale for glucose cover and monitoring. Capillary glucose has been controlled at 132 and 121. Continue to hold on basal insulin.   PAD (peripheral artery disease) (HCC) Left BKA.   Depression Continue with PRN alprazolam.  Patient requested to discontinue sertraline due to hallucinations.   Protein-calorie malnutrition, moderate (HCC) Continue with nutritional supplementation.         Subjective: Patient  is having diarrhea and abdominal pain today, no nausea or vomiting. He had declined other oral medications despite imodium   Physical Exam: Vitals:   08/22/22 0600 08/22/22 0723 08/22/22  1115 08/22/22 1443  BP:  (!) 164/90 (!) 158/80   Pulse:  71 75   Resp:  16 20   Temp:  98.4 F (36.9 C) 97.8 F (36.6 C) 98.3 F (36.8 C)  TempSrc:  Oral Oral Oral  SpO2:  100% 97%   Weight: 116.6 kg     Height:       Neurology awake and alert ENT with mild pallor Cardiovascular with S1 and S2 present and regular with no gallops Respiratory with no rales or wheezing Abdomen with no distention, mild tender to palpation with no rebound or guarding Positive lower extremity edema ++ right, left BKA  Foley catheter in place  Data Reviewed:    Family Communication: no family at the bedside   Disposition: Status is: Inpatient Remains inpatient appropriate because: inpatient renal replacement therapy   Planned Discharge Destination: Home    Author: Coralie Keens, MD 08/22/2022 3:51 PM  For on call review [email or web address].

## 2022-08-22 NOTE — Progress Notes (Signed)
Patient ID: [name], male   DOB: October 15, 1960, 62 y.o.   MRN: [id number] Patient states he is not feeling well today. Complained of loose Bms and asked for imodium, order obtained. Other than imodium, patient refused all medications, mouth care, and nutritional supplements. MD aware. Will continue to monitor .  Lidia Collum, RN

## 2022-08-23 DIAGNOSIS — N179 Acute kidney failure, unspecified: Secondary | ICD-10-CM | POA: Diagnosis not present

## 2022-08-23 DIAGNOSIS — Z8673 Personal history of transient ischemic attack (TIA), and cerebral infarction without residual deficits: Secondary | ICD-10-CM | POA: Diagnosis not present

## 2022-08-23 DIAGNOSIS — I10 Essential (primary) hypertension: Secondary | ICD-10-CM | POA: Diagnosis not present

## 2022-08-23 DIAGNOSIS — D61818 Other pancytopenia: Secondary | ICD-10-CM | POA: Diagnosis not present

## 2022-08-23 LAB — RENAL FUNCTION PANEL
Albumin: 2.6 g/dL — ABNORMAL LOW (ref 3.5–5.0)
Albumin: 2.6 g/dL — ABNORMAL LOW (ref 3.5–5.0)
Anion gap: 11 (ref 5–15)
Anion gap: 12 (ref 5–15)
BUN: 33 mg/dL — ABNORMAL HIGH (ref 8–23)
BUN: 33 mg/dL — ABNORMAL HIGH (ref 8–23)
CO2: 26 mmol/L (ref 22–32)
CO2: 26 mmol/L (ref 22–32)
Calcium: 8.2 mg/dL — ABNORMAL LOW (ref 8.9–10.3)
Calcium: 8.4 mg/dL — ABNORMAL LOW (ref 8.9–10.3)
Chloride: 100 mmol/L (ref 98–111)
Chloride: 100 mmol/L (ref 98–111)
Creatinine, Ser: 3.17 mg/dL — ABNORMAL HIGH (ref 0.61–1.24)
Creatinine, Ser: 3.27 mg/dL — ABNORMAL HIGH (ref 0.61–1.24)
GFR, Estimated: 21 mL/min — ABNORMAL LOW (ref >60)
GFR, Estimated: 21 mL/min — ABNORMAL LOW (ref >60)
Glucose, Bld: 121 mg/dL — ABNORMAL HIGH (ref 70–99)
Glucose, Bld: 132 mg/dL — ABNORMAL HIGH (ref 70–99)
Phosphorus: 4 mg/dL (ref 2.5–4.6)
Phosphorus: 4 mg/dL (ref 2.5–4.6)
Potassium: 3 mmol/L — ABNORMAL LOW (ref 3.5–5.1)
Potassium: 3 mmol/L — ABNORMAL LOW (ref 3.5–5.1)
Sodium: 137 mmol/L (ref 135–145)
Sodium: 138 mmol/L (ref 135–145)

## 2022-08-23 LAB — CBC
HCT: 24.1 % — ABNORMAL LOW (ref 39.0–52.0)
Hemoglobin: 7.7 g/dL — ABNORMAL LOW (ref 13.0–17.0)
MCH: 29.2 pg (ref 26.0–34.0)
MCHC: 32 g/dL (ref 30.0–36.0)
MCV: 91.3 fL (ref 80.0–100.0)
Platelets: 105 10*3/uL — ABNORMAL LOW (ref 150–400)
RBC: 2.64 MIL/uL — ABNORMAL LOW (ref 4.22–5.81)
RDW: 16.6 % — ABNORMAL HIGH (ref 11.5–15.5)
WBC: 1.3 10*3/uL — CL (ref 4.0–10.5)
nRBC: 0 % (ref 0.0–0.2)

## 2022-08-23 LAB — GLUCOSE, CAPILLARY
Glucose-Capillary: 116 mg/dL — ABNORMAL HIGH (ref 70–99)
Glucose-Capillary: 99 mg/dL (ref 70–99)
Glucose-Capillary: 123 mg/dL — ABNORMAL HIGH (ref 70–99)
Glucose-Capillary: 110 mg/dL — ABNORMAL HIGH (ref 70–99)

## 2022-08-23 MED ORDER — ANTICOAGULANT SODIUM CITRATE 4% (200MG/5ML) IV SOLN: 5.0000 mL | Status: DC | PRN

## 2022-08-23 MED ORDER — HYDROMORPHONE HCL 1 MG/ML IJ SOLN
0.5000 mg | Freq: Three times a day (TID) | INTRAMUSCULAR | Status: DC | PRN
  Administered 2022-08-24 – 2022-09-04 (×11): 0.5 mg via INTRAVENOUS
  Filled 2022-08-23 (×13): qty 0.5

## 2022-08-23 MED ORDER — LIDOCAINE HCL (PF) 1 % IJ SOLN
5.0000 mL | INTRAMUSCULAR | Status: DC | PRN
Start: 2022-08-23 — End: 2022-08-23

## 2022-08-23 MED ORDER — PENTAFLUOROPROP-TETRAFLUOROETH EX AERO
1.0000 | INHALATION_SPRAY | CUTANEOUS | Status: DC | PRN
Start: 2022-08-23 — End: 2022-08-23

## 2022-08-23 MED ORDER — ALTEPLASE 2 MG IJ SOLR
2.0000 mg | Freq: Once | INTRAMUSCULAR | Status: DC | PRN
Start: 2022-08-23 — End: 2022-08-23

## 2022-08-23 MED ORDER — LIDOCAINE-PRILOCAINE 2.5-2.5 % EX CREA
1.0000 | TOPICAL_CREAM | CUTANEOUS | Status: DC | PRN
Start: 2022-08-23 — End: 2022-08-23

## 2022-08-23 MED ORDER — ALTEPLASE 2 MG IJ SOLR: 2.0000 mg | Freq: Once | INTRAMUSCULAR | Status: DC | PRN

## 2022-08-23 MED ORDER — HEPARIN SODIUM (PORCINE) 1000 UNIT/ML IJ SOLN
INTRAMUSCULAR | Status: AC
  Administered 2022-08-23: 3800 [IU]
  Filled 2022-08-23: qty 4

## 2022-08-23 MED ORDER — HEPARIN SODIUM (PORCINE) 1000 UNIT/ML DIALYSIS: 1000.0000 [IU] | INTRAMUSCULAR | Status: DC | PRN

## 2022-08-23 MED ORDER — BOOST / RESOURCE BREEZE PO LIQD CUSTOM
1.0000 | Freq: Three times a day (TID) | ORAL | Status: DC
  Administered 2022-08-23 – 2022-08-30 (×11): 1 via ORAL

## 2022-08-23 MED ORDER — OXYCODONE HCL 5 MG PO TABS
5.0000 mg | ORAL_TABLET | ORAL | Status: DC | PRN
  Administered 2022-08-23 – 2022-09-02 (×10): 5 mg via ORAL
  Filled 2022-08-23 (×10): qty 1

## 2022-08-23 MED ORDER — LIDOCAINE-PRILOCAINE 2.5-2.5 % EX CREA: 1.0000 | TOPICAL_CREAM | CUTANEOUS | Status: DC | PRN

## 2022-08-23 MED ORDER — PROSOURCE PLUS PO LIQD
30.0000 mL | Freq: Three times a day (TID) | ORAL | Status: DC
  Administered 2022-08-23 – 2022-08-26 (×7): 30 mL via ORAL
  Filled 2022-08-23 (×11): qty 30

## 2022-08-23 MED ORDER — LIDOCAINE HCL (PF) 1 % IJ SOLN: 5.0000 mL | INTRAMUSCULAR | Status: DC | PRN

## 2022-08-23 MED ORDER — PENTAFLUOROPROP-TETRAFLUOROETH EX AERO: 1.0000 | INHALATION_SPRAY | CUTANEOUS | Status: DC | PRN

## 2022-08-23 MED ORDER — ANTICOAGULANT SODIUM CITRATE 4% (200MG/5ML) IV SOLN
5.0000 mL | Status: DC | PRN
Start: 2022-08-23 — End: 2022-08-23

## 2022-08-23 NOTE — Discharge Planning (Signed)
[facility]  Initial Hemodialysis Orders  Dialysis center: [facility]  Patient's name: [name] DOB: 02/16/1961 AKI or ESRD: ESRD  Discharge diagnosis: AoCKD 4 now ESRD 2.   Pancytopenia 3.   AoC HF   Allergies:  Allergies  Allergen Reactions   Other Other (See Comments)    Grits- reaction not listed on MAR   Sertraline Other (See Comments)    Reaction not known    Date of First Dialysis: 08/16/2022 Cause of renal disease: Cardiorenal  Dialysis Prescription: Dialysis Frequency: TIW Tx duration: 3.5 hours  BFR: 400 DFR: 600 NA(Add titration orders here if needed) EDW: 114 kg Dialyzer: 180NRe UF profile/Sodium modeling?: NA Dialysis Bath: 3 K 2.5 Ca  Dialysis access: Access type: [facility]  Date placed: 08/16/2022 Surgeon: Interventional Radiology Needle gauge: NA  In Center Medications: Heparin Dose: No Heparin  Type: NA VDRA: Calcitriol/Hectorol: Per protocol Venofer: Per protocol Mircera: Per protocol Next dose due: Per protocol Sensipar: NA  Discharge labs: Hgb: 7.7 K+: 3.0  Ca: 8.4  Phos: 4.0 Alb: 2.6  Platelets: 105  Please draw Monthly labs. Additional labs needed: Weekly K+ level while on 3.0 K bath Additional notes/follow-up: NA   Alonna Buckler [facility] [phone]

## 2022-08-23 NOTE — Progress Notes (Addendum)
Progress Note   Patient: [name] ZOX:[id number] DOB: 07/30/1960 DOA: 08/08/2022     15 DOS: the patient was seen and examined on 08/23/2022   Brief hospital course: [name] was admitted to the hospital with the working diagnosis of symptomatic anemia.   62 yo male with the past medical history of hypertension, T2DM, CKD stage IV, peripheral vascular disease, history of CVA and chronic heart failure, who presented from SNF for evaluation of low hgb. Recent hospitalization 05/22 to 05/25 for heart failure exacerbation, and anemia with positive stool occult blood test. Required 3 units PRBC transfusion. No invasive procedure required. Follow up hgb was low and patient was referred to the ED for further evaluation. On his initial physical examination his blood pressure was 144/87, HR 69, RR 18 and 02 saturation 97%, lungs with bilateral rales with no wheezing, heart with S1 and S2 present and rhythmic, no abdominal distention and positive left stump pitting edema.   Na 133 K 3,8 CL 102 bicarbonate 20, glucose 131 BUN 80 cr 5,26  BNP 498  High sensitive troponin 31  Iron 72, transferrin saturation 32, ferritin 628.  Wbc 1,9, hgb 6,9 plt 57   Transfused one unit PRBC with follow up hgb at 7,6  Positive urinary retention, required in and out catheterization.   06/15 continue to have hypervolemia. Further drop in Hgb down to 6,8 and had PRBC transfusion.  06/16 patient with urinary retention but has refused to Foley catheter.   Had [facility] placed and HD started on 6/21.  Nephrology and palliative medicine following.  Oncology consulted and ordered bone marrow biopsy.  Unfortunately, bone marrow biopsy aborted on 6/25 due to orthopnea, desaturation, nausea and emesis.  IR recommends outpatient follow-up.    Tolerated HD in recliner 6/26. Still needs a lot of assistance to get out of bed (hoyer lift, 2+ assist).  Possible permanent access next week, if cell count more stable.   Assessment and  Plan: * Pancytopenia (HCC) Symptomatic anemia.  Recent GI evaluation, recommended conservative management.   Sp PRBC transfusion for a total of 3 since admission.   Possible bone marrow failure considering leukopenia and thrombocytopenia. Poor prognosis.  Not able to perform bone marrow biopsy due to patient not able to stay flat and prone for procedure.  Continue conservative therapy with EPO and as needed PRBC transfusion.   Wbc 1,3, Hgb 7,7 plt 105   Acute renal failure superimposed on stage 4 chronic kidney disease (HCC) Hypokalemia. Hyponatremia.   Possible multifactorial.  TDC placed on 06/21.  Continue intermittent renal replacement therapy with ultrafiltration.   His edema is improving.   BUN is 33, K 3.0, bicarbonate 26. Na 138,  Patient had HD today.   Acute on chronic heart failure with preserved ejection fraction (HFpEF) (HCC) Echocardiogram with preserved LV systolic function. EF 50 to 55%. Moderate LVH, RV systolic function preserved. Mild aortic valve stenosis.   Blood pressure control with amlodipine, carvedilol and hydralazine.  Edema is improving.  Continue ultrafiltration with HD.   Essential hypertension Continue blood pressure control with carvedilol, amlodipine and hydralazine.   History of CVA (cerebrovascular accident) Continue supportive medical therapy. PT and OT. Patient will return to SNF when medically stable.   T2DM (type 2 diabetes mellitus) (HCC) Continue insulin sliding scale for glucose cover and monitoring. Continue to hold on basal insulin.   Fasting glucose today 132 mg/dl.   PAD (peripheral artery disease) (HCC) Left BKA.   Depression Continue with PRN alprazolam.  Patient requested to discontinue sertraline due to hallucinations.   Patient with persistent pain, multifactorial.  Will add oral oxycodone and will decrease frequency of hydromorphone to evert 8 hrs as needed. Will try to avoid Iv opioid analgesics.    Protein-calorie malnutrition, moderate (HCC) Continue with nutritional supplementation.         Subjective: Patient very weak and deconditioned, poor oral intake, having nausea and diarrhea.   Physical Exam: Vitals:   08/23/22 1130 08/23/22 1203 08/23/22 1230 08/23/22 1333  BP: (!) 189/92 (!) 180/99 (!) 178/88 (!) 181/92  Pulse: 76 75 76 78  Resp: 16 12 11    Temp:   98.1 F (36.7 C)   TempSrc:   Oral   SpO2: 100% 100% 100% 95%  Weight:      Height:       Neurology eyes closed and somnolent but easy to arouse and able to respond to questions ENT with mild pallor Cardiovascular with S1 and S2 present and rhythmic, positive systolic murmur at the apex No JVD Respiratory with no rales or wheezing, no rhonchi Abdomen with no distention  Edema + on the right with left BKA  Data Reviewed:    Family Communication: no family at the bedside   Disposition: Status is: Inpatient Remains inpatient appropriate because: pending outpatient HD   Planned Discharge Destination: Skilled nursing facility    Author: Coralie Keens, MD 08/23/2022 4:16 PM  For on call review [email or web address].

## 2022-08-23 NOTE — Progress Notes (Signed)
Received patient in recliner.Awake,alert and oriented x 4.  Medicine given Zofran 4 mg iv  Access used :Right HD catheter that worked well. Dressing on date.  Duration of treatment: 3.5 hours.  Fluid removed :3.1 liters.  Hemo comment: Last hour of his treatment,he became nauseous,then vomited,thus 3.1 liters of fluid from him and Zofran was given.  Hand off to the patient's nurse.

## 2022-08-23 NOTE — Progress Notes (Signed)
Renal Navigator requested CSW learn if SNF has a preferred HD clinic and time. CSW called [facility] and they states they prefer pt gets HD at [facility] or [facility] on MWF 8 or 8:30. They stated they would prefer early appointment. CSW communicated this information with renal navigator.

## 2022-08-23 NOTE — Progress Notes (Signed)
Pt's d/c date is unknown at this time per CSW. Requested that CSW please advise snf to send a hoyer pad/sling under pt to HD so pt can be lifted from w/c to HD chair. Contacted renal NP to request that orders be sent to [facility] in the event pt stable for d/c this weekend. Contacted [facility] to advise staff that pt's d/c date is unknown but renal NP to send orders just in case pt is stable for d/c this weekend. Navigator can f/u with clinic on Monday morning to provide update as to whether pt was d/c or not. Will assist as needed.   Olivia Canter Renal Navigator [phone]

## 2022-08-23 NOTE — Progress Notes (Signed)
[facility] Progress Note    Assessment/ Plan:   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  Patient has had multiple consultation with palliative care and discussed goals of care.  It seems like it was decided to pursue dialysis as per patient interest and monitor.  Based on the current functional status, deconditioning and challenges with mobility I am worried that he may not be a good candidate for outpatient dialysis.  He is nonoliguric but certainly no sign of renal recovery.   -s/p RIJ TDC placed by IR followed by dialysis on 6/21. Will plan for HD on MWF schedule -CLIP: [facility] MWF 12:20pm chair time.  Tolerated HD in recliner 6/26. Still needs a lot of assistance to get out of bed (hoyer lift, 2+ assist). Will c/w HD in recliner -can plan for permanent access however would prefer to have his pancytopenia to be evaluated first, I am concerned about him having a permanent access placed in the context of thrombocytopenia--if still here next week and platelet count is holding steady then will go ahead and consult VVS   Acute urinary retention: On Flomax and Foley catheter.   Pancytopenia/anemia: hemonc consulted for further evaluation.  Iron saturation 32% with high ferritin.  On ESA. Did not tolerate BM biopsy 6/25. Transfuse PRN for hgb <7   Acute on chronic diastolic CHF: Treated with IV diuretics initially.  Now managing volume with HD.   HTN/volume: Continue current antihypertensives, managing volume with HD.   Metabolic acidosis: Managed with dialysis, discontinued sodium bicarbonate., bicarb wnl   Hypokalemia: Dialyzing with high potassium bath.  DNR/DNI  Subjective:   Patient seen and examined on dialysis. Dialyzing in recliner which is great. No complaints, tolerating treatment    Objective:   BP (!) 177/89   Pulse 73   Temp 98.8 F (37.1 C)   Resp (!) 21   Ht 5\' 8"  (1.727 m)   Wt 118.2 kg   SpO2 100%   BMI 39.62  kg/m   Intake/Output Summary (Last 24 hours) at 08/23/2022 1124 Last data filed at 08/23/2022 0100 Gross per 24 hour  Intake --  Output 450 ml  Net -450 ml   Weight change: 1.6 kg  Physical Exam: Gen: NAD CVS: RRR Resp: CTA B/L Abd: soft, nt/nd Ext: woody pitting edema RLE, left BKA (stump edema) Neuro: awake, following commands Dialysis access: RIJ TDC  Imaging: No results found.  Labs: BMET Recent Labs  Lab 08/17/22 0741 08/18/22 0748 08/19/22 0049 08/20/22 0034 08/21/22 0635 08/23/22 0108 08/23/22 0843  NA 136 136 135 133* 137 137 138  K 3.3* 3.2* 3.3* 3.4* 3.2* 3.0* 3.0*  CL 101 100 100 99 99 100 100  CO2 23 23 22 24 24 26 26   GLUCOSE 133* 145* 150* 165* 132* 121* 132*  BUN 87* 65* 68* 45* 50* 33* 33*  CREATININE 4.94* 4.26* 4.17* 3.37* 3.74* 3.17* 3.27*  CALCIUM 7.8* 7.8* 7.8* 7.8* 8.1* 8.2* 8.4*  PHOS 4.8* 4.5 4.5 3.6 4.2 4.0 4.0   CBC Recent Labs  Lab 08/19/22 0733 08/20/22 0034 08/21/22 0635 08/23/22 0843  WBC 1.6* 1.3* 1.4* 1.3*  NEUTROABS 0.6*  --   --   --   HGB 7.9* 7.7* 7.7* 7.7*  HCT 24.7* 24.0* 23.8* 24.1*  MCV 87.6 87.3 89.8 91.3  PLT 69* 71* 78* 105*    Medications:     amLODipine  10 mg Oral Daily   aspirin  81 mg Oral Daily   atorvastatin  80 mg Oral QHS   carvedilol  25 mg Oral BID WC   Chlorhexidine Gluconate Cloth  6 each Topical Q0600   darbepoetin (ARANESP) injection - NON-DIALYSIS  100 mcg Subcutaneous Q Wed-1800   feeding supplement (NEPRO CARB STEADY)  237 mL Oral BID BM   hydrALAZINE  50 mg Oral 4 times per day   insulin aspart  0-5 Units Subcutaneous QHS   insulin aspart  0-6 Units Subcutaneous TID WC   multivitamin  1 tablet Oral QHS   mouth rinse  15 mL Mouth Rinse 4 times per day   pantoprazole  40 mg Oral Daily   sodium chloride flush  3 mL Intravenous Q12H   tamsulosin  0.4 mg Oral QPC supper      [name], MD [facility] 08/23/2022, 11:24 AM

## 2022-08-23 NOTE — Progress Notes (Signed)
Nutrition Follow-up  DOCUMENTATION CODES:   Obesity unspecified, Non-severe (moderate) malnutrition in context of chronic illness  INTERVENTION:  Liberalize diet in setting of inadequate po intake, malnutrition and need for increased nutritional intake Discontinue Nepro shakes Boost Breeze po TID, each supplement provides 250 kcal and 9 grams of protein 30 ml ProSource Plus TID, each supplement provides 100 kcals and 15 grams protein.  Renal MVI with minerals daily Discussed recommendation of enteral nutrition with MD; plans to continue to optimize po intake with nutrition supplements for now  NUTRITION DIAGNOSIS:   Moderate Malnutrition related to chronic illness (HF, PAD) as evidenced by mild fat depletion, mild muscle depletion, edema. - remains applicable  GOAL:   Patient will meet greater than or equal to 90% of their needs - goal unmet  MONITOR:   PO intake, Supplement acceptance, Labs, Weight trends, I & O's  REASON FOR ASSESSMENT:   Malnutrition Screening Tool    ASSESSMENT:   Pt admitted from SNF d/t low hemoglobin. PMH significant for HTN, insulin-dependent DM, CVA, PAD, chronic HFpEF. Recently admitted for acute on chronic HF, acute hypoxic respiratory failure and new pancytopenia with positive FOBT.  6/21 - s/p tunneled HD cath placement; HD initiated 6/25 - s/p bone marrow biopsy d/t leukopenia and thrombocytopenia  Oncology recommend bone marrow biopsy d/t leukopenia and pancytopenia. Unable to obtain biopsy d/t inability to remain flat and prone.   Po intake remains very limited. Despite trying different nutrition supplements, pt continues to refuse. Pt noted to have episodes of nausea/vomiting and abdominal pain including today during HD.    Meal completions: 6/23: 0% breakfast, 25% lunch, 0% dinner 6/24: 100% dinner 6/25: 0% lunch and dinner 6/26: 100% nighttime snack  In the setting of malnutrition, new stage 2 pressure injury and ongoing inadequate  PO intake, patient would benefit from supplement nutrition to meet nutrition need until PO intake improves. Discussed with MD who expressed concerns of aspiration. If aligns with GOC, would recommend post-pyloric Cortrak tube for optimal tolerance of tube feeding especially given instances of emesis.   Edema: mild pitting generalized, non-pitting BUE, moderate pitting RLE  Medications: SSI 0-5 units at bedtime, SSI 0-6 units TID, rena-vit, protonix  Labs: potassium 3.0, BUN 33, Cr 3.27, GFR 21, CBG's 99-130 x24 hours  Post HD net UF 3.1L (6/28) Post HD weight unable to be obtained  Diet Order:   Diet Order             Diet regular Room service appropriate? Yes with Assist; Fluid consistency: Thin; Fluid restriction: 1200 mL Fluid  Diet effective now                   EDUCATION NEEDS:   Education needs have been addressed  Skin:  Skin Assessment: Skin Integrity Issues: Skin Integrity Issues:: Stage II Stage II: sacrum  Last BM:  6/27 (type 7 large)  Height:   Ht Readings from Last 1 Encounters:  08/09/22 5\' 8"  (1.727 m)    Weight:   Wt Readings from Last 1 Encounters:  08/23/22 118.2 kg    Ideal Body Weight:  70 kg  BMI:  Body mass index is 39.62 kg/m.  Estimated Nutritional Needs:   Kcal:  1800-2000  Protein:  95-110g  Fluid:  >/=1.8L  Drusilla Kanner, RDN, LDN Clinical Nutrition

## 2022-08-23 NOTE — Progress Notes (Signed)
Pt has been sleeping intermittently throughout the shift He has been complaining about having abdominal pain and nausea intermittently throughout the shift. His appetite has been low, and he stated he hasn't eaten much at all today. I see that he refused multiple medications as well. When asked why, he stated it was because he didn't feel good. Pain has been in abdomen, upper mid abdomen and traveling downward per the patient statement when I did my assessment. He is not vomiting, but having some retching noises and belching. He actually asked me for pain medicine and something for nausea twice during this shift This is uncommon for Mr Centanni because his last dose of pain med was back on the 24th  of this month.  He was reluctant to take anything but tylenol, by stated he needed something stronger because the tylenol wasn't helping. I will continue to monitor him for changes in status He has refused any repositioning throughout the shift at well No s/s or c/o sob/dyspnea to note while on O2 via [place]/belongings are within reach Bed in proper position for safety

## 2022-08-24 DIAGNOSIS — I10 Essential (primary) hypertension: Secondary | ICD-10-CM | POA: Diagnosis not present

## 2022-08-24 DIAGNOSIS — N179 Acute kidney failure, unspecified: Secondary | ICD-10-CM | POA: Diagnosis not present

## 2022-08-24 DIAGNOSIS — Z8673 Personal history of transient ischemic attack (TIA), and cerebral infarction without residual deficits: Secondary | ICD-10-CM | POA: Diagnosis not present

## 2022-08-24 DIAGNOSIS — D61818 Other pancytopenia: Secondary | ICD-10-CM | POA: Diagnosis not present

## 2022-08-24 LAB — CBC
HCT: 23.9 % — ABNORMAL LOW (ref 39.0–52.0)
Hemoglobin: 7.5 g/dL — ABNORMAL LOW (ref 13.0–17.0)
MCH: 28.4 pg (ref 26.0–34.0)
MCHC: 31.4 g/dL (ref 30.0–36.0)
MCV: 90.5 fL (ref 80.0–100.0)
Platelets: 100 10*3/uL — ABNORMAL LOW (ref 150–400)
RBC: 2.64 MIL/uL — ABNORMAL LOW (ref 4.22–5.81)
RDW: 16.5 % — ABNORMAL HIGH (ref 11.5–15.5)
WBC: 1.3 10*3/uL — CL (ref 4.0–10.5)
nRBC: 0 % (ref 0.0–0.2)

## 2022-08-24 LAB — RENAL FUNCTION PANEL
Albumin: 2.6 g/dL — ABNORMAL LOW (ref 3.5–5.0)
Anion gap: 10 (ref 5–15)
BUN: 22 mg/dL (ref 8–23)
CO2: 26 mmol/L (ref 22–32)
Calcium: 8.2 mg/dL — ABNORMAL LOW (ref 8.9–10.3)
Chloride: 99 mmol/L (ref 98–111)
Creatinine, Ser: 2.43 mg/dL — ABNORMAL HIGH (ref 0.61–1.24)
GFR, Estimated: 30 mL/min — ABNORMAL LOW (ref >60)
Glucose, Bld: 152 mg/dL — ABNORMAL HIGH (ref 70–99)
Phosphorus: 3.1 mg/dL (ref 2.5–4.6)
Potassium: 3.2 mmol/L — ABNORMAL LOW (ref 3.5–5.1)
Sodium: 135 mmol/L (ref 135–145)

## 2022-08-24 LAB — GLUCOSE, CAPILLARY
Glucose-Capillary: 134 mg/dL — ABNORMAL HIGH (ref 70–99)
Glucose-Capillary: 134 mg/dL — ABNORMAL HIGH (ref 70–99)
Glucose-Capillary: 166 mg/dL — ABNORMAL HIGH (ref 70–99)
Glucose-Capillary: 159 mg/dL — ABNORMAL HIGH (ref 70–99)

## 2022-08-24 NOTE — Progress Notes (Signed)
[facility] Progress Note    Assessment/ Plan:   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  Patient has had multiple consultation with palliative care and discussed goals of care.  It seems like it was decided to pursue dialysis as per patient interest and monitor.  Based on the current functional status, deconditioning and challenges with mobility I am worried that he may not be a good candidate for outpatient dialysis.  He is nonoliguric but certainly no sign of renal recovery.   -s/p RIJ TDC placed by IR followed by dialysis on 6/21. Will plan for HD on MWF schedule -CLIP: [facility] MWF 12:20pm chair time.  Tolerated HD in recliner 6/26. Still needs a lot of assistance to get out of bed (hoyer lift, 2+ assist). Will c/w HD in recliner -can plan for permanent access however would prefer to have his pancytopenia to be evaluated first, I am concerned about him having a permanent access placed in the context of thrombocytopenia, which fortunately improved. Will consult VVS for perm access but if leaving this weekend then can revisit as an outpatient. Patient is agreeable to having a AVF/AVG placed   Acute urinary retention: On Flomax and Foley catheter.   Pancytopenia/anemia: hemonc consulted for further evaluation.  Iron saturation 32% with high ferritin.  On ESA. Did not tolerate BM biopsy 6/25. Transfuse PRN for hgb <7   Acute on chronic diastolic CHF: Treated with IV diuretics initially.  Now managing volume with HD.   HTN/volume: Continue current antihypertensives, managing volume with HD.   Metabolic acidosis: Managed with dialysis, discontinued sodium bicarbonate., bicarb wnl   Hypokalemia: Dialyzing with high potassium bath.  DNR/DNI  Subjective:   Patient seen and examined bedside. No acute events. No complaints. Tolerated HD yesterday with net UF 3.1L   Objective:   BP (!) 162/82 (BP Location: Left Wrist)   Pulse 74   Temp  98 F (36.7 C) (Oral)   Resp 20   Ht 5\' 8"  (1.727 m)   Wt 123.4 kg   SpO2 97%   BMI 41.36 kg/m   Intake/Output Summary (Last 24 hours) at 08/24/2022 0981 Last data filed at 08/24/2022 0034 Gross per 24 hour  Intake 240 ml  Output 3400 ml  Net -3160 ml   Weight change: 5.2 kg  Physical Exam: Gen: NAD CVS: RRR Resp: CTA B/L Abd: soft, nt/nd Ext: woody pitting edema RLE (softer), left BKA (stump edema) Neuro: awake, following commands Dialysis access: RIJ TDC  Imaging: No results found.  Labs: BMET Recent Labs  Lab 08/18/22 0748 08/19/22 0049 08/20/22 0034 08/21/22 0635 08/23/22 0108 08/23/22 0843 08/24/22 0047  NA 136 135 133* 137 137 138 135  K 3.2* 3.3* 3.4* 3.2* 3.0* 3.0* 3.2*  CL 100 100 99 99 100 100 99  CO2 23 22 24 24 26 26 26   GLUCOSE 145* 150* 165* 132* 121* 132* 152*  BUN 65* 68* 45* 50* 33* 33* 22  CREATININE 4.26* 4.17* 3.37* 3.74* 3.17* 3.27* 2.43*  CALCIUM 7.8* 7.8* 7.8* 8.1* 8.2* 8.4* 8.2*  PHOS 4.5 4.5 3.6 4.2 4.0 4.0 3.1   CBC Recent Labs  Lab 08/19/22 0733 08/20/22 0034 08/21/22 0635 08/23/22 0843 08/24/22 0703  WBC 1.6* 1.3* 1.4* 1.3* 1.3*  NEUTROABS 0.6*  --   --   --   --   HGB 7.9* 7.7* 7.7* 7.7* 7.5*  HCT 24.7* 24.0* 23.8* 24.1* 23.9*  MCV 87.6 87.3 89.8  91.3 90.5  PLT 69* 71* 78* 105* 100*    Medications:     (feeding supplement) PROSource Plus  30 mL Oral TID BM   amLODipine  10 mg Oral Daily   aspirin  81 mg Oral Daily   atorvastatin  80 mg Oral QHS   carvedilol  25 mg Oral BID WC   Chlorhexidine Gluconate Cloth  6 each Topical Q0600   darbepoetin (ARANESP) injection - NON-DIALYSIS  100 mcg Subcutaneous Q Wed-1800   feeding supplement  1 Container Oral TID BM   hydrALAZINE  50 mg Oral 4 times per day   insulin aspart  0-5 Units Subcutaneous QHS   insulin aspart  0-6 Units Subcutaneous TID WC   multivitamin  1 tablet Oral QHS   mouth rinse  15 mL Mouth Rinse 4 times per day   pantoprazole  40 mg Oral Daily    sodium chloride flush  3 mL Intravenous Q12H   tamsulosin  0.4 mg Oral QPC supper      [name], MD [facility] 08/24/2022, 8:37 AM

## 2022-08-24 NOTE — Progress Notes (Signed)
Pt refused med at scheduled time. Blood pressure elevated with headache and stomach cramps. Pt agree take medications at this time including tylenol for head.ache

## 2022-08-24 NOTE — Plan of Care (Signed)
  Problem: Metabolic: Goal: Ability to maintain appropriate glucose levels will improve Outcome: Progressing   Problem: Education: Goal: Ability to demonstrate management of disease process will improve Outcome: Not Progressing   Problem: Activity: Goal: Capacity to carry out activities will improve Outcome: Not Progressing   Problem: Coping: Goal: Ability to adjust to condition or change in health will improve Outcome: Not Progressing   Problem: Fluid Volume: Goal: Ability to maintain a balanced intake and output will improve Outcome: Not Progressing   Problem: Education: Goal: Knowledge of General Education information will improve Description: Including pain rating scale, medication(s)/side effects and non-pharmacologic comfort measures Outcome: Not Progressing   Problem: Activity: Goal: Risk for activity intolerance will decrease Outcome: Not Progressing   Problem: Coping: Goal: Level of anxiety will decrease Outcome: Not Progressing   Problem: Pain Managment: Goal: General experience of comfort will improve Outcome: Not Progressing

## 2022-08-24 NOTE — Plan of Care (Signed)
  Problem: Education: Goal: Ability to demonstrate management of disease process will improve Outcome: Progressing   Problem: Coping: Goal: Ability to adjust to condition or change in health will improve Outcome: Progressing   Problem: Fluid Volume: Goal: Ability to maintain a balanced intake and output will improve Outcome: Progressing   Problem: Skin Integrity: Goal: Risk for impaired skin integrity will decrease Outcome: Progressing   Problem: Elimination: Goal: Will not experience complications related to bowel motility Outcome: Progressing  Foley with Decreased output on dialysis

## 2022-08-24 NOTE — Progress Notes (Signed)
   08/24/22 1122  Vitals  Temp 98.6 F (37 C)  Temp Source Oral  BP (!) 184/97  MAP (mmHg) 121  BP Location Left Arm  Patient Position (if appropriate) Lying  Pulse Rate 75  MEWS COLOR  MEWS Score Color Green  Oxygen Therapy  SpO2 100 %  O2 Device Room Air  O2 Flow Rate (L/min) 2 L/min  FiO2 (%) 100 %  MEWS Score  MEWS Temp 0  MEWS Systolic 0  MEWS Pulse 0  MEWS RR 0  MEWS LOC 0  MEWS Score 0

## 2022-08-24 NOTE — Plan of Care (Signed)
  Problem: Metabolic: Goal: Ability to maintain appropriate glucose levels will improve Outcome: Progressing   Problem: Education: Goal: Ability to demonstrate management of disease process will improve Outcome: Not Progressing Goal: Ability to verbalize understanding of medication therapies will improve Outcome: Not Progressing   Problem: Activity: Goal: Capacity to carry out activities will improve Outcome: Not Progressing   Problem: Education: Goal: Knowledge of General Education information will improve Description: Including pain rating scale, medication(s)/side effects and non-pharmacologic comfort measures Outcome: Not Progressing   Problem: Health Behavior/Discharge Planning: Goal: Ability to manage health-related needs will improve Outcome: Not Progressing   Problem: Activity: Goal: Risk for activity intolerance will decrease Outcome: Not Progressing   Problem: Coping: Goal: Level of anxiety will decrease Outcome: Not Progressing

## 2022-08-24 NOTE — Progress Notes (Signed)
Progress Note   Patient: [name] ZOX:[id number] DOB: 07/17/1960 DOA: 08/08/2022     16 DOS: the patient was seen and examined on 08/24/2022   Brief hospital course: [name] was admitted to the hospital with the working diagnosis of symptomatic anemia.   62 yo male with the past medical history of hypertension, T2DM, CKD stage IV, peripheral vascular disease, history of CVA and chronic heart failure, who presented from SNF for evaluation of low hgb. Recent hospitalization 05/22 to 05/25 for heart failure exacerbation, and anemia with positive stool occult blood test. Required 3 units PRBC transfusion. No invasive procedure required. Follow up hgb was low and patient was referred to the ED for further evaluation. On his initial physical examination his blood pressure was 144/87, HR 69, RR 18 and 02 saturation 97%, lungs with bilateral rales with no wheezing, heart with S1 and S2 present and rhythmic, no abdominal distention and positive left stump pitting edema.   Na 133 K 3,8 CL 102 bicarbonate 20, glucose 131 BUN 80 cr 5,26  BNP 498  High sensitive troponin 31  Iron 72, transferrin saturation 32, ferritin 628.  Wbc 1,9, hgb 6,9 plt 57   Transfused one unit PRBC with follow up hgb at 7,6  Positive urinary retention, required in and out catheterization.   06/15 continue to have hypervolemia. Further drop in Hgb down to 6,8 and had PRBC transfusion.  06/16 patient with urinary retention but has refused to Foley catheter.   Had [facility] placed and HD started on 6/21.  Nephrology and palliative medicine following.  Oncology consulted and ordered bone marrow biopsy.  Unfortunately, bone marrow biopsy aborted on 6/25 due to orthopnea, desaturation, nausea and emesis.  IR recommends outpatient follow-up.    Tolerated HD in recliner 6/26. Still needs a lot of assistance to get out of bed (hoyer lift, 2+ assist).  Possible permanent access next week, if cell count more stable.   06/29 patient with  improvement in nausea and back pain. Continue very weak and deconditioned. Poor oral intake.   Assessment and Plan: * Pancytopenia (HCC) Symptomatic anemia.  Recent GI evaluation, recommended conservative management.   Sp PRBC transfusion for a total of 3 since admission.   Possible bone marrow failure considering leukopenia and thrombocytopenia. Poor prognosis.  Not able to perform bone marrow biopsy due to patient not able to stay flat and prone for procedure.  Continue conservative therapy with EPO and as needed PRBC transfusion.   Wbc 1,3, Hgb 7,7 plt 105   Acute renal failure superimposed on stage 4 chronic kidney disease (HCC) Hypokalemia. Hyponatremia.   Possible multifactorial.  TDC placed on 06/21.  Continue intermittent renal replacement therapy with ultrafiltration.   His edema is improving.  Urine output is 300 cc Fluid balance is negative, since admission 17,848 ml.   BUN today is 22 with improvement in his nausea. K is 3,2 and serum bicarbonate at 26. P 3.1   Follow up renal panel in am.  If persistent or worsening hypokalemia will add oral supplementation.   Acute on chronic heart failure with preserved ejection fraction (HFpEF) (HCC) Echocardiogram with preserved LV systolic function. EF 50 to 55%. Moderate LVH, RV systolic function preserved. Mild aortic valve stenosis.   Blood pressure control with amlodipine, carvedilol and hydralazine.  Edema is improving.  Continue ultrafiltration with HD.   Essential hypertension Continue blood pressure control with carvedilol, amlodipine and hydralazine.   History of CVA (cerebrovascular accident) Continue supportive medical therapy. PT  and OT. Patient will return to SNF when medically stable.   T2DM (type 2 diabetes mellitus) (HCC) Continue insulin sliding scale for glucose cover and monitoring. Continue to hold on basal insulin.   Fasting glucose today 152 mg/dl.   PAD (peripheral artery disease)  (HCC) Left BKA.   Depression Continue with PRN alprazolam.  Patient requested to discontinue sertraline due to hallucinations.   Patient with persistent pain, multifactorial.  Continue with oral oxycodone and hydromorphone.   Protein-calorie malnutrition, moderate (HCC) Continue with nutritional supplementation.         Subjective: Patient with improvement in nausea and vomiting. No chest pain.   Physical Exam: Vitals:   08/24/22 0031 08/24/22 0506 08/24/22 0842 08/24/22 1122  BP: (!) 166/87 (!) 162/82 (!) 189/92 (!) 184/97  Pulse:  74 74 75  Resp: 20 20 18    Temp: 98.2 F (36.8 C) 98 F (36.7 C) 98.4 F (36.9 C) 98.6 F (37 C)  TempSrc: Oral Oral Oral Oral  SpO2:  97% 98% 100%  Weight:  123.4 kg    Height:       Neurology somnolent but easy to arouse, better than yesterday. Follows commands and responds to questions ENT with mild pallor Cardiovascular with S1 and S2 present and rhythmic, positive systolic murmur at the right lower sternal border, no gallops or rubs Respiratory with no rales or wheezing, no rhonchi Abdomen with no distention  Left lower extremity edema + and left BKA Has foley catheter in place  Data Reviewed:    Family Communication: no family at the bedside   Disposition: Status is: Inpatient Remains inpatient appropriate because: renal failure   Planned Discharge Destination: Skilled nursing facility      Author: Coralie Keens, MD 08/24/2022 12:12 PM  For on call review [email or web address].

## 2022-08-25 DIAGNOSIS — N179 Acute kidney failure, unspecified: Secondary | ICD-10-CM | POA: Diagnosis not present

## 2022-08-25 DIAGNOSIS — L89302 Pressure ulcer of unspecified buttock, stage 2: Secondary | ICD-10-CM

## 2022-08-25 DIAGNOSIS — E785 Hyperlipidemia, unspecified: Secondary | ICD-10-CM

## 2022-08-25 DIAGNOSIS — I10 Essential (primary) hypertension: Secondary | ICD-10-CM | POA: Diagnosis not present

## 2022-08-25 DIAGNOSIS — D61818 Other pancytopenia: Secondary | ICD-10-CM | POA: Diagnosis not present

## 2022-08-25 DIAGNOSIS — Z8673 Personal history of transient ischemic attack (TIA), and cerebral infarction without residual deficits: Secondary | ICD-10-CM | POA: Diagnosis not present

## 2022-08-25 DIAGNOSIS — E1169 Type 2 diabetes mellitus with other specified complication: Secondary | ICD-10-CM

## 2022-08-25 LAB — GLUCOSE, CAPILLARY
Glucose-Capillary: 164 mg/dL — ABNORMAL HIGH (ref 70–99)
Glucose-Capillary: 182 mg/dL — ABNORMAL HIGH (ref 70–99)
Glucose-Capillary: 136 mg/dL — ABNORMAL HIGH (ref 70–99)
Glucose-Capillary: 142 mg/dL — ABNORMAL HIGH (ref 70–99)

## 2022-08-25 LAB — CBC
HCT: 23.2 % — ABNORMAL LOW (ref 39.0–52.0)
Hemoglobin: 7.2 g/dL — ABNORMAL LOW (ref 13.0–17.0)
MCH: 28.3 pg (ref 26.0–34.0)
MCHC: 31 g/dL (ref 30.0–36.0)
MCV: 91.3 fL (ref 80.0–100.0)
Platelets: 94 10*3/uL — ABNORMAL LOW (ref 150–400)
RBC: 2.54 MIL/uL — ABNORMAL LOW (ref 4.22–5.81)
RDW: 16.7 % — ABNORMAL HIGH (ref 11.5–15.5)
WBC: 1.3 10*3/uL — CL (ref 4.0–10.5)
nRBC: 0 % (ref 0.0–0.2)

## 2022-08-25 LAB — RENAL FUNCTION PANEL
Albumin: 2.6 g/dL — ABNORMAL LOW (ref 3.5–5.0)
Anion gap: 10 (ref 5–15)
BUN: 31 mg/dL — ABNORMAL HIGH (ref 8–23)
CO2: 27 mmol/L (ref 22–32)
Calcium: 8.2 mg/dL — ABNORMAL LOW (ref 8.9–10.3)
Chloride: 99 mmol/L (ref 98–111)
Creatinine, Ser: 3.08 mg/dL — ABNORMAL HIGH (ref 0.61–1.24)
GFR, Estimated: 22 mL/min — ABNORMAL LOW (ref >60)
Glucose, Bld: 136 mg/dL — ABNORMAL HIGH (ref 70–99)
Phosphorus: 3.9 mg/dL (ref 2.5–4.6)
Potassium: 3.3 mmol/L — ABNORMAL LOW (ref 3.5–5.1)
Sodium: 136 mmol/L (ref 135–145)

## 2022-08-25 NOTE — Progress Notes (Signed)
Progress Note   Patient: [name] ZOX:[id number] DOB: 09-Feb-1961 DOA: 08/08/2022     17 DOS: the patient was seen and examined on 08/25/2022   Brief hospital course: Mr. Milone was admitted to the hospital with the working diagnosis of symptomatic anemia.   62 yo male with the past medical history of hypertension, T2DM, CKD stage IV, peripheral vascular disease, history of CVA and chronic heart failure, who presented from SNF for evaluation of low hgb. Recent hospitalization 05/22 to 05/25 for heart failure exacerbation, and anemia with positive stool occult blood test. Required 3 units PRBC transfusion. No invasive procedure required. Follow up hgb was low and patient was referred to the ED for further evaluation. On his initial physical examination his blood pressure was 144/87, HR 69, RR 18 and 02 saturation 97%, lungs with bilateral rales with no wheezing, heart with S1 and S2 present and rhythmic, no abdominal distention and positive left stump pitting edema.   Na 133 K 3,8 CL 102 bicarbonate 20, glucose 131 BUN 80 cr 5,26  BNP 498  High sensitive troponin 31  Iron 72, transferrin saturation 32, ferritin 628.  Wbc 1,9, hgb 6,9 plt 57   Transfused one unit PRBC with follow up hgb at 7,6  Positive urinary retention, required in and out catheterization.   06/15 continue to have hypervolemia. Further drop in Hgb down to 6,8 and had PRBC transfusion.  06/16 patient with urinary retention but has refused to Foley catheter.   Had [facility] and HD started on 6/21.  Nephrology and palliative medicine following.  Oncology consulted and ordered bone marrow biopsy.  Unfortunately, bone marrow biopsy aborted on 6/25 due to orthopnea, desaturation, nausea and emesis.  IR recommends outpatient follow-up.    Tolerated HD in recliner 6/26. Still needs a lot of assistance to get out of bed (hoyer lift, 2+ assist).  Possible permanent access next week, if cell count more stable.   06/29 patient with  improvement in nausea and back pain. Continue very weak and deconditioned. Poor oral intake.  06/30 mentation and gastrointestinal symptoms improving, plan for vascular surgery consult for AV fistula creation.   Assessment and Plan: * Pancytopenia (HCC) Symptomatic anemia.  Recent GI evaluation, recommended conservative management.   Sp PRBC transfusion for a total of 3 since admission.   Possible bone marrow failure considering leukopenia and thrombocytopenia. Poor prognosis.  Not able to perform bone marrow biopsy due to patient not able to stay flat and prone for procedure.  Continue conservative therapy with EPO and as needed PRBC transfusion.   Wbc 1,3, Hgb 7.2 plt 94   Acute renal failure superimposed on stage 4 chronic kidney disease (HCC) Hypokalemia. Hyponatremia.   Possible multifactorial.  TDC placed on 06/21.  Continue intermittent renal replacement therapy with ultrafiltration.   His edema is improving.  Urine output is 250 cc Fluid balance is negative, since admission 18,973 ml.   BUN 31, K 3,3 CL 99, bicarbonate 27. Na 136. P 3.9   Patient for HD tomorrow.   Acute on chronic heart failure with preserved ejection fraction (HFpEF) (HCC) Echocardiogram with preserved LV systolic function. EF 50 to 55%. Moderate LVH, RV systolic function preserved. Mild aortic valve stenosis.   Blood pressure control with amlodipine, carvedilol and hydralazine.  Edema is improving.  Continue ultrafiltration with HD.   Essential hypertension Continue blood pressure control with carvedilol, amlodipine and hydralazine.   History of CVA (cerebrovascular accident) Continue supportive medical therapy. PT and OT. Patient  will return to SNF when medically stable.   Type 2 diabetes mellitus with hyperlipidemia (HCC) Continue insulin sliding scale for glucose cover and monitoring. Continue to hold on basal insulin.   Fasting glucose today 136 mg/dl.   Continue with statin  therapy.   PAD (peripheral artery disease) (HCC) Left BKA.   Depression Continue with PRN alprazolam.  Patient requested to discontinue sertraline due to hallucinations.   Patient with persistent pain, multifactorial.  Continue with oral oxycodone and hydromorphone.   Protein-calorie malnutrition, moderate (HCC) Continue with nutritional supplementation.   Pressure injury of buttock, stage 2 (HCC)  Active Pressure Injury/Wound(s)     Pressure Ulcer  Duration          Pressure Injury 08/22/22 Sacrum Stage 2 -  Partial thickness loss of dermis presenting as a shallow open injury with a red, pink wound bed without slough. skin tear 3 days                Subjective: Patient is feeling better today, nausea has improved and today is more awake.   Physical Exam: Vitals:   08/24/22 2300 08/25/22 0300 08/25/22 0500 08/25/22 0849  BP: (!) 181/83 (!) 162/91  (!) 171/109  Pulse: 66 65  71  Resp: 18 18  16   Temp: 97.9 F (36.6 C) 98.2 F (36.8 C)    TempSrc: Axillary Axillary    SpO2: 96% 98%    Weight:   110.1 kg   Height:       Neurology awake and alert ENT with mild pallor Cardiovascular with S1 and S2 present and rhythmic, positive systolic murmur at the right lower sternal borser No JVD Positive lower extremity edema right + to ++ and left BKA Abdomen with no distention  Data Reviewed:    Family Communication: no family at the bedside   Disposition: Status is: Inpatient Remains inpatient appropriate because: renal replacement therapy, needs permanent access.   Planned Discharge Destination: Skilled nursing facility     Author: Coralie Keens, MD 08/25/2022 10:46 AM  For on call review [email or web address].

## 2022-08-25 NOTE — Assessment & Plan Note (Addendum)
  Active Pressure Injury/Wound(s)     Pressure Ulcer  Duration          Pressure Injury 08/22/22 Sacrum Stage 2 -  Partial thickness loss of dermis presenting as a shallow open injury with a red, pink wound bed without slough. skin tear 3 days

## 2022-08-25 NOTE — Progress Notes (Signed)
[name] Progress Note    Assessment/ Plan:   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  Patient has had multiple consultation with palliative care and discussed goals of care.  It seems like it was decided to pursue dialysis as per patient interest and monitor.  Based on the current functional status, deconditioning and challenges with mobility I am worried that he may not be a good candidate for outpatient dialysis.  -s/p RIJ TDC placed by IR followed by dialysis on 6/21. Will plan for HD on MWF schedule -CLIP: [facility] MWF 12:20pm chair time.  Tolerated HD in recliner 6/26. Still needs a lot of assistance to get out of bed (hoyer lift, 2+ assist). Will c/w HD in recliner which he fortunately has been tolerating -can plan for permanent access however was concerned in the context of thrombocytopenia, which fortunately improved. Will consult [facility] tomorrow if patient is still here and plt count is stable. Patient is agreeable to having a AVF/AVG placed   Acute urinary retention: On Flomax and Foley catheter.   Pancytopenia/anemia: hemonc consulted for further evaluation.  Iron saturation 32% with high ferritin.  On ESA. Did not tolerate BM biopsy 6/25. Transfuse PRN for hgb <7   Acute on chronic diastolic CHF: Treated with IV diuretics initially.  Now managing volume with HD.   HTN/volume: Continue current antihypertensives, managing volume with HD.   Metabolic acidosis: Managed with dialysis, discontinued sodium bicarbonate., bicarb wnl   Hypokalemia: Dialyzing with high potassium bath.  DNR/DNI  Subjective:   Patient seen and examined bedside. No acute events. No complaints. Resting comfortably   Objective:   BP (!) 162/91   Pulse 65   Temp 98.2 F (36.8 C) (Axillary)   Resp 18   Ht 5\' 8"  (1.727 m)   Wt 110.1 kg   SpO2 98%   BMI 36.91 kg/m   Intake/Output Summary (Last 24 hours) at 08/25/2022 0851 Last data filed at  08/25/2022 0600 Gross per 24 hour  Intake 240 ml  Output 250 ml  Net -10 ml   Weight change: -13.3 kg  Physical Exam: Gen: NAD CVS: RRR Resp: CTA B/L Abd: soft, nt/nd Ext: woody pitting edema RLE (softer), left BKA (stump edema) Neuro: awake, following commands Dialysis access: RIJ TDC  Imaging: No results found.  Labs: BMET Recent Labs  Lab 08/19/22 0049 08/20/22 0034 08/21/22 4098 08/23/22 0108 08/23/22 0843 08/24/22 0047 08/25/22 0111  NA 135 133* 137 137 138 135 136  K 3.3* 3.4* 3.2* 3.0* 3.0* 3.2* 3.3*  CL 100 99 99 100 100 99 99  CO2 22 24 24 26 26 26 27   GLUCOSE 150* 165* 132* 121* 132* 152* 136*  BUN 68* 45* 50* 33* 33* 22 31*  CREATININE 4.17* 3.37* 3.74* 3.17* 3.27* 2.43* 3.08*  CALCIUM 7.8* 7.8* 8.1* 8.2* 8.4* 8.2* 8.2*  PHOS 4.5 3.6 4.2 4.0 4.0 3.1 3.9   CBC Recent Labs  Lab 08/19/22 0733 08/20/22 0034 08/21/22 0635 08/23/22 0843 08/24/22 0703 08/25/22 0705  WBC 1.6*   < > 1.4* 1.3* 1.3* 1.3*  NEUTROABS 0.6*  --   --   --   --   --   HGB 7.9*   < > 7.7* 7.7* 7.5* 7.2*  HCT 24.7*   < > 23.8* 24.1* 23.9* 23.2*  MCV 87.6   < > 89.8 91.3 90.5 91.3  PLT 69*   < > 78* 105* 100* 94*   < > =  values in this interval not displayed.    Medications:     (feeding supplement) PROSource Plus  30 mL Oral TID BM   amLODipine  10 mg Oral Daily   aspirin  81 mg Oral Daily   atorvastatin  80 mg Oral QHS   carvedilol  25 mg Oral BID WC   Chlorhexidine Gluconate Cloth  6 each Topical Q0600   darbepoetin (ARANESP) injection - NON-DIALYSIS  100 mcg Subcutaneous Q Wed-1800   feeding supplement  1 Container Oral TID BM   hydrALAZINE  50 mg Oral 4 times per day   insulin aspart  0-5 Units Subcutaneous QHS   insulin aspart  0-6 Units Subcutaneous TID WC   multivitamin  1 tablet Oral QHS   mouth rinse  15 mL Mouth Rinse 4 times per day   pantoprazole  40 mg Oral Daily   sodium chloride flush  3 mL Intravenous Q12H   tamsulosin  0.4 mg Oral QPC supper       Roy Sar, MD [facility] 08/25/2022, 8:51 AM

## 2022-08-26 ENCOUNTER — Telehealth: Payer: Self-pay

## 2022-08-26 DIAGNOSIS — I10 Essential (primary) hypertension: Secondary | ICD-10-CM | POA: Diagnosis not present

## 2022-08-26 DIAGNOSIS — Z8673 Personal history of transient ischemic attack (TIA), and cerebral infarction without residual deficits: Secondary | ICD-10-CM | POA: Diagnosis not present

## 2022-08-26 DIAGNOSIS — D61818 Other pancytopenia: Secondary | ICD-10-CM | POA: Diagnosis not present

## 2022-08-26 DIAGNOSIS — N179 Acute kidney failure, unspecified: Secondary | ICD-10-CM | POA: Diagnosis not present

## 2022-08-26 LAB — RENAL FUNCTION PANEL
Albumin: 2.5 g/dL — ABNORMAL LOW (ref 3.5–5.0)
Anion gap: 10 (ref 5–15)
BUN: 39 mg/dL — ABNORMAL HIGH (ref 8–23)
CO2: 26 mmol/L (ref 22–32)
Calcium: 7.9 mg/dL — ABNORMAL LOW (ref 8.9–10.3)
Chloride: 97 mmol/L — ABNORMAL LOW (ref 98–111)
Creatinine, Ser: 3.68 mg/dL — ABNORMAL HIGH (ref 0.61–1.24)
GFR, Estimated: 18 mL/min — ABNORMAL LOW (ref >60)
Glucose, Bld: 142 mg/dL — ABNORMAL HIGH (ref 70–99)
Phosphorus: 3.8 mg/dL (ref 2.5–4.6)
Potassium: 3.4 mmol/L — ABNORMAL LOW (ref 3.5–5.1)
Sodium: 133 mmol/L — ABNORMAL LOW (ref 135–145)

## 2022-08-26 LAB — GLUCOSE, CAPILLARY
Glucose-Capillary: 113 mg/dL — ABNORMAL HIGH (ref 70–99)
Glucose-Capillary: 109 mg/dL — ABNORMAL HIGH (ref 70–99)
Glucose-Capillary: 131 mg/dL — ABNORMAL HIGH (ref 70–99)
Glucose-Capillary: 127 mg/dL — ABNORMAL HIGH (ref 70–99)

## 2022-08-26 LAB — HEMOGLOBIN AND HEMATOCRIT, BLOOD
HCT: 22.6 % — ABNORMAL LOW (ref 39.0–52.0)
Hemoglobin: 7 g/dL — ABNORMAL LOW (ref 13.0–17.0)

## 2022-08-26 NOTE — Telephone Encounter (Signed)
Frances Furbish, PA with [facility] called to request an IP consult for new permanent access.  Page sent to all PA's with details.

## 2022-08-26 NOTE — Procedures (Signed)
I was present at this dialysis session. I have reviewed the session itself and made appropriate changes.   Vital signs in last 24 hours:  Temp:  [97.8 F (36.6 C)-98.6 F (37 C)] 98.6 F (37 C) (07/01 0433) Pulse Rate:  [67-69] 69 (07/01 0433) Resp:  [18] 18 (07/01 0433) BP: (121-175)/(65-90) 175/90 (07/01 0658) SpO2:  [97 %-99 %] 97 % (07/01 0433) Weight:  [114.8 kg] 114.8 kg (07/01 0433) Weight change: 4.7 kg Filed Weights   08/24/22 0506 08/25/22 0500 08/26/22 0433  Weight: 123.4 kg 110.1 kg 114.8 kg    Recent Labs  Lab 08/26/22 0056  NA 133*  K 3.4*  CL 97*  CO2 26  GLUCOSE 142*  BUN 39*  CREATININE 3.68*  CALCIUM 7.9*  PHOS 3.8    Recent Labs  Lab 08/23/22 0843 08/24/22 0703 08/25/22 0705 08/26/22 0056  WBC 1.3* 1.3* 1.3*  --   HGB 7.7* 7.5* 7.2* 7.0*  HCT 24.1* 23.9* 23.2* 22.6*  MCV 91.3 90.5 91.3  --   PLT 105* 100* 94*  --     Scheduled Meds:  (feeding supplement) PROSource Plus  30 mL Oral TID BM   amLODipine  10 mg Oral Daily   atorvastatin  80 mg Oral QHS   carvedilol  25 mg Oral BID WC   Chlorhexidine Gluconate Cloth  6 each Topical Q0600   darbepoetin (ARANESP) injection - NON-DIALYSIS  100 mcg Subcutaneous Q Wed-1800   feeding supplement  1 Container Oral TID BM   hydrALAZINE  50 mg Oral 4 times per day   insulin aspart  0-5 Units Subcutaneous QHS   insulin aspart  0-6 Units Subcutaneous TID WC   multivitamin  1 tablet Oral QHS   mouth rinse  15 mL Mouth Rinse 4 times per day   pantoprazole  40 mg Oral Daily   sodium chloride flush  3 mL Intravenous Q12H   tamsulosin  0.4 mg Oral QPC supper   Continuous Infusions: PRN Meds:.acetaminophen **OR** acetaminophen, albuterol, ALPRAZolam, HYDROmorphone (DILAUDID) injection, loperamide, ondansetron (ZOFRAN) IV, mouth rinse, mouth rinse, oxyCODONE, white petrolatum   Irena Cords,  MD 08/26/2022, 8:52 AM

## 2022-08-26 NOTE — Progress Notes (Signed)
   08/26/22 1230  Vitals  Pulse Rate 74  Resp 17  BP (!) 196/92  SpO2 100 %  Post Treatment  Dialyzer Clearance Clear  Duration of HD Treatment -hour(s) 3.5 hour(s)  Hemodialysis Intake (mL) 0 mL  Liters Processed 84  Fluid Removed (mL) 3500 mL  Tolerated HD Treatment Yes   Received patient in bed to unit.  Alert and oriented.  Informed consent signed and in chart.   TX duration:3.5hrs  Patient tolerated well.  Transported back to the room  Alert, without acute distress.  Hand-off given to patient's nurse.   Access used: [facility] Access issues: none  Total UF removed: 3.5L Medication(s) given: none    [name]

## 2022-08-26 NOTE — Consult Note (Addendum)
[facility] NOTE   MRN : [id number]  Reason for Consult: ESRD Referring Physician: Nephrology   History of Present Illness: 62 y/o male with history of CKD was brought to the hospital with SOB and CP.  He felt to be not a good candidate for hemodialysis based on his functional status and refusal to HD.  Palliative consultation was recommended by nephrology.  However, he later changed his mind and agreed to have dialysis.  Had [facility] placed and HD started on 6/21.  Nephrology and palliative medicine following.  Oncology consulted for pancytopenia.  He denies chest implants.    Past medical history includes: insulin-dependent type 2 diabetes, PVD, history of left BKA, CVA with residual right-sided weakness, hypertension, hyperlipidemia, bipolar disorder, cocaine abuse, depression, BPH, morbid obesity.      Current Facility-Administered Medications  Medication Dose Route Frequency Provider Last Rate Last Admin   (feeding supplement) PROSource Plus liquid 30 mL  30 mL Oral TID BM Arrien, York Ram, MD   30 mL at 08/25/22 1500   acetaminophen (TYLENOL) tablet 650 mg  650 mg Oral Q6H PRN Opyd, Lavone Neri, MD   650 mg at 08/24/22 1112   Or   acetaminophen (TYLENOL) suppository 650 mg  650 mg Rectal Q6H PRN Opyd, Lavone Neri, MD       albuterol (PROVENTIL) (2.5 MG/3ML) 0.083% nebulizer solution 3 mL  3 mL Inhalation Q6H PRN Opyd, Lavone Neri, MD       ALPRAZolam Prudy Feeler) tablet 0.5 mg  0.5 mg Oral TID PRN Arrien, York Ram, MD   0.5 mg at 08/24/22 1858   amLODipine (NORVASC) tablet 10 mg  10 mg Oral Daily Opyd, Lavone Neri, MD   10 mg at 08/25/22 0955   atorvastatin (LIPITOR) tablet 80 mg  80 mg Oral QHS Briscoe Deutscher, MD   80 mg at 08/23/22 2113   carvedilol (COREG) tablet 25 mg  25 mg Oral BID WC Candelaria Stagers T, MD   25 mg at 08/25/22 1623   Chlorhexidine Gluconate Cloth 2 % PADS 6 each  6 each Topical Q0600 Maxie Barb, MD   6 each at 08/25/22 0408    Darbepoetin Alfa (ARANESP) injection 100 mcg  100 mcg Subcutaneous Q Wed-1800 [name], MD   100 mcg at 08/21/22 1723   feeding supplement (BOOST / RESOURCE BREEZE) liquid 1 Container  1 Container Oral TID BM Arrien, York Ram, MD   1 Container at 08/25/22 2131   hydrALAZINE (APRESOLINE) tablet 50 mg  50 mg Oral 4 times per day Candelaria Stagers T, MD   50 mg at 08/26/22 0447   HYDROmorphone (DILAUDID) injection 0.5 mg  0.5 mg Intravenous Q8H PRN Arrien, York Ram, MD   0.5 mg at 08/26/22 0030   insulin aspart (novoLOG) injection 0-5 Units  0-5 Units Subcutaneous QHS Briscoe Deutscher, MD   4 Units at 08/13/22 2102   insulin aspart (novoLOG) injection 0-6 Units  0-6 Units Subcutaneous TID WC Opyd, Lavone Neri, MD   1 Units at 08/25/22 1206   loperamide (IMODIUM) capsule 2 mg  2 mg Oral Q4H PRN Arrien, York Ram, MD   2 mg at 08/22/22 1058   multivitamin (RENA-VIT) tablet 1 tablet  1 tablet Oral QHS Candelaria Stagers T, MD   1 tablet at 08/25/22 2131   ondansetron (ZOFRAN) injection 4 mg  4 mg Intravenous Q6H PRN Carollee Herter, DO   4 mg at 08/26/22 4098   Oral  care mouth rinse  15 mL Mouth Rinse PRN Arrien, York Ram, MD       Oral care mouth rinse  15 mL Mouth Rinse 4 times per day Candelaria Stagers T, MD   15 mL at 08/25/22 1503   Oral care mouth rinse  15 mL Mouth Rinse PRN Candelaria Stagers T, MD       oxyCODONE (Oxy IR/ROXICODONE) immediate release tablet 5 mg  5 mg Oral Q4H PRN Arrien, York Ram, MD   5 mg at 08/25/22 1503   pantoprazole (PROTONIX) EC tablet 40 mg  40 mg Oral Daily Arrien, York Ram, MD   40 mg at 08/25/22 0955   sodium chloride flush (NS) 0.9 % injection 3 mL  3 mL Intravenous Q12H Opyd, Lavone Neri, MD   3 mL at 08/25/22 2130   tamsulosin (FLOMAX) capsule 0.4 mg  0.4 mg Oral QPC supper Estanislado Emms, MD   0.4 mg at 08/25/22 1815   white petrolatum (VASELINE) gel   Topical PRN Arrien, York Ram, MD   0.2 Application at 08/24/22 2041    Pt meds  include: Statin :Yes Betablocker: Yes ASA: No Other anticoagulants/antiplatelets: none  Past Medical History:  Diagnosis Date   Acute on chronic diastolic CHF (congestive heart failure) (HCC) 08/22/2020   Acute on chronic heart failure with preserved ejection fraction (HCC)    Acute renal failure superimposed on stage 3a chronic kidney disease (HCC) 11/18/2020   Arthritis    Bacteremia due to Enterococcus 12/09/2021   Bipolar affective (HCC)    CHF (congestive heart failure) (HCC)    Claustrophobia    Severe   Cocaine abuse (HCC) 05/31/2014   Cocaine dependence, uncomplicated (HCC) 05/31/2014   Depression    Diabetes mellitus    uncontrolled    Elevated CK    Heart murmur    Hypertension    Ileus (HCC) 11/18/2020   Metabolic acidosis 10/23/2021   Non-traumatic rhabdomyolysis 01/01/2020   Pericardial effusion 08/22/2020   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   Suicidal ideations 08/28/2013    Past Surgical History:  Procedure Laterality Date   ABDOMINAL AORTOGRAM W/LOWER EXTREMITY Left 03/02/2021   Procedure: ABDOMINAL AORTOGRAM W/LOWER EXTREMITY;  Surgeon: Leonie Douglas, MD;  Location: [facility];  Service: Cardiovascular;  Laterality: Left;   AMPUTATION Left 03/07/2021   Procedure: LEFT BELOW KNEE AMPUTATION;  Surgeon: Leonie Douglas, MD;  Location: [facility] OR;  Service: Vascular;  Laterality: Left;   APPLICATION OF WOUND VAC Left 03/07/2021   Procedure: APPLICATION OF WOUND VAC;  Surgeon: Leonie Douglas, MD;  Location: [facility] OR;  Service: Vascular;  Laterality: Left;   BOWEL DECOMPRESSION N/A 01/19/2020   Procedure: BOWEL DECOMPRESSION;  Surgeon: Kathi Der, MD;  Location: [facility] ENDOSCOPY;  Service: Gastroenterology;  Laterality: N/A;   BOWEL DECOMPRESSION N/A 11/19/2020   Procedure: BOWEL DECOMPRESSION;  Surgeon: Charlott Rakes, MD;  Location: [facility] ENDOSCOPY;  Service: Endoscopy;  Laterality: N/A;   BOWEL DECOMPRESSION N/A  12/03/2020   Procedure: BOWEL DECOMPRESSION;  Surgeon: Kerin Salen, MD;  Location: [facility] ENDOSCOPY;  Service: Gastroenterology;  Laterality: N/A;   COLONOSCOPY WITH PROPOFOL N/A 01/19/2020   Procedure: COLONOSCOPY WITH PROPOFOL;  Surgeon: Kathi Der, MD;  Location: [facility] ENDOS[facility];  Service: Gastroenterology;  Laterality: N/A;   COLONOSCOPY WITH PROPOFOL N/A 11/19/2020   Procedure: COLONOSCOPY WITH PROPOFOL;  Surgeon: Charlott Rakes, MD;  Location: [facility] ENDOSCOPY;  Service: Endoscopy;  Laterality: N/A;   COLONOSCOPY WITH PROPOFOL N/A 12/03/2020  Procedure: COLONOSCOPY WITH PROPOFOL;  Surgeon: Kerin Salen, MD;  Location: [facility] ENDOSCOPY;  Service: Gastroenterology;  Laterality: N/A;   IR FLUORO GUIDE CV LINE RIGHT  08/16/2022   IR US GUIDE VASC ACCESS RIGHT  08/16/2022   KNEE SURGERY     b/l knees; pending total knee    RADIOLOGY WITH ANESTHESIA N/A 07/04/2020   Procedure: MRI WITH ANESTHESIA BRAIN WITHOUT CONTRAST AND HEAD WITHOUT CONTRAST;  Surgeon: Radiologist, Medication, MD;  Location: [facility] OR;  Service: Radiology;  Laterality: N/A;   TRANSMETATARSAL AMPUTATION Left 03/01/2021   Procedure: TRANSMETATARSAL AMPUTATION;  Surgeon: Vivi Barrack, DPM;  Location: [facility] OR;  Service: Podiatry;  Laterality: Left;    Social History Social History   Tobacco Use   Smoking status: Former   Smokeless tobacco: Never  Building services engineer Use: Never used  Substance Use Topics   Alcohol use: No   Drug use: Not Currently    Types: Cocaine    Comment: Last use 2019    Family History Family History  Problem Relation Age of Onset   Diabetes Other    Cancer Mother        uterus; deceased    Heart attack Mother 40   Hypertension Other    Heart attack Father 49   Stroke Sister        12/2012 age 33 y.o    Allergies  Allergen Reactions   Other Other (See Comments)    Grits- reaction not listed on MAR   Sertraline Other (See Comments)    Reaction not known     REVIEW OF SYSTEMS  General: [  ] Weight loss, [ ]  Fever, [ ]  chills Neurologic: [ ]  Dizziness, [ ]  Blackouts, [ ]  Seizure [ ]  Stroke, [ ]  "Mini stroke", [ ]  Slurred speech, [ ]  Temporary blindness; [ x] weakness in arms or legs, [ ]  Hoarseness [ ]  Dysphagia Cardiac: [ ]  Chest pain/pressure, [ ]  Shortness of breath at rest [ ]  Shortness of breath with exertion, [ ]  Atrial fibrillation or irregular heartbeat  Vascular: [ ]  Pain in legs with walking, [ ]  Pain in legs at rest, [ ]  Pain in legs at night,  [ ]  Non-healing ulcer, [ ]  Blood clot in vein/DVT,   Pulmonary: [ ]  Home oxygen, [ ]  Productive cough, [ ]  Coughing up blood, [ ]  Asthma,  [ ]  Wheezing [ ]  COPD Musculoskeletal:  [ ]  Arthritis, [ ]  Low back pain, [ ]  Joint pain Hematologic: [ ]  Easy Bruising, [x ] Anemia; [ ]  Hepatitis Gastrointestinal: [ ]  Blood in stool, [ ]  Gastroesophageal Reflux/heartburn, Urinary: [ ]  chronic Kidney disease, [x ] on HD - [ ]  MWF or [ ]  TTHS, [ ]  Burning with urination, [ ]  Difficulty urinating Skin: [ ]  Rashes, [ ]  Wounds Psychological: [ ]  Anxiety, [ ]  Depression  Physical Examination Vitals:   08/26/22 1100 08/26/22 1130 08/26/22 1200 08/26/22 1228  BP: (!) 181/91 (!) 184/90 (!) 190/93 (!) 188/91  Pulse: 72 73 74 74  Resp: 11 12 15 14   Temp:      TempSrc:      SpO2: 100% 100% 100% 100%  Weight:      Height:       Body mass index is 38.48 kg/m.  General:  WDWN in NAD Gait:not observed HENT: WNL Eyes: Pupils equal Pulmonary: normal non-labored breathing , without Rales, rhonchi,  wheezing Cardiac: RRR, without  Murmurs, rubs or gallops; No carotid bruits Abdomen: soft,  NT, no masses Skin: no rashes, ulcers noted;  no Gangrene , no cellulitis; no open wounds;   Vascular Exam/Pulses: radial pulses B UE, edema in all extremities   Musculoskeletal: no muscle wasting or atrophy;   Neurologic: A&O X 3; Appropriate Affect ;  SENSATION: normal; MOTOR FUNCTION:minimal active motor Speech is fluent/normal   Significant  Diagnostic Studies: CBC Lab Results  Component Value Date   WBC 1.3 (LL) 08/25/2022   HGB 7.0 (L) 08/26/2022   HCT 22.6 (L) 08/26/2022   MCV 91.3 08/25/2022   PLT 94 (L) 08/25/2022    BMET    Component Value Date/Time   NA 133 (L) 08/26/2022 0056   NA 135 (L) 12/31/2013 1336   K 3.4 (L) 08/26/2022 0056   K 3.8 12/31/2013 1336   CL 97 (L) 08/26/2022 0056   CL 101 12/31/2013 1336   CO2 26 08/26/2022 0056   CO2 25 12/31/2013 1336   GLUCOSE 142 (H) 08/26/2022 0056   GLUCOSE 249 (H) 12/31/2013 1336   BUN 39 (H) 08/26/2022 0056   BUN 8 12/31/2013 1336   CREATININE 3.68 (H) 08/26/2022 0056   CREATININE 0.88 12/31/2013 1336   CALCIUM 7.9 (L) 08/26/2022 0056   CALCIUM 8.6 12/31/2013 1336   GFRNONAA 18 (L) 08/26/2022 0056   GFRNONAA >60 12/31/2013 1336   GFRAA >60 01/29/2018 1705   GFRAA >60 12/31/2013 1336   Estimated Creatinine Clearance: 25.9 mL/min (A) (by C-G formula based on SCr of 3.68 mg/dL (H)).  COAG Lab Results  Component Value Date   INR 1.3 (H) 12/08/2021     Non-Invasive Vascular Imaging: vein mapping ordered today   ASSESSMENT/PLAN:   ESRD with TDC on HD  He is right hand dominant with palpable pulses. Vein mapping is pending and will plan for left UE AV fistula verse graft pending OR schedule. He agrees with this plan.   Mosetta Pigeon 08/26/2022 12:34 PM   VASCULAR STAFF ADDENDUM: I have independently interviewed and examined the patient. Patient is a poor surgical candidate due to his numerous comorbidities and current clinical status. He would benefit from further optimization prior to pursuing elective fistula placement. Pending clinical improvement, possible fistula creation prior to discharge.  Timing to be determined. Will follow-up vein mapping and discusses care with Dr. Lawson Radar, MD [facility] Phone Number: [phone] 08/26/2022 10:09 PM

## 2022-08-26 NOTE — Progress Notes (Signed)
Pt refuse CHG and foley care.  Education provided on importance of foley care/CHG.  Will continue to reinforce education.  Pt transferred to chair for HD.

## 2022-08-26 NOTE — Progress Notes (Signed)
Progress Note   Patient: [name] GNF:[id number] DOB: [birth year] DOA: 08/08/2022     18 DOS: the patient was seen and examined on 08/26/2022   Brief hospital course: [name] was admitted to the hospital with the working diagnosis of symptomatic anemia.   62 yo male with the past medical history of hypertension, T2DM, CKD stage IV, peripheral vascular disease, history of CVA and chronic heart failure, who presented from SNF for evaluation of low hgb. Recent hospitalization 05/22 to 05/25 for heart failure exacerbation, and anemia with positive stool occult blood test. Required 3 units PRBC transfusion. No invasive procedure required. Follow up hgb was low and patient was referred to the ED for further evaluation. On his initial physical examination his blood pressure was 144/87, HR 69, RR 18 and 02 saturation 97%, lungs with bilateral rales with no wheezing, heart with S1 and S2 present and rhythmic, no abdominal distention and positive left stump pitting edema.   Na 133 K 3,8 CL 102 bicarbonate 20, glucose 131 BUN 80 cr 5,26  BNP 498  High sensitive troponin 31  Iron 72, transferrin saturation 32, ferritin 628.  Wbc 1,9, hgb 6,9 plt 57   Transfused one unit PRBC with follow up hgb at 7,6  Positive urinary retention, required in and out catheterization.   06/15 continue to have hypervolemia. Further drop in Hgb down to 6,8 and had PRBC transfusion.  06/16 patient with urinary retention but has refused to Foley catheter.   Had [facility] placed and HD started on 6/21.  Nephrology and palliative medicine following.  Oncology consulted and ordered bone marrow biopsy.  Unfortunately, bone marrow biopsy aborted on 6/25 due to orthopnea, desaturation, nausea and emesis.  IR recommends outpatient follow-up.    Tolerated HD in recliner 6/26. Still needs a lot of assistance to get out of bed (hoyer lift, 2+ assist).  Possible permanent access next week, if cell count more stable.   06/29 patient with  improvement in nausea and back pain. Continue very weak and deconditioned. Poor oral intake.  06/30 mentation and gastrointestinal symptoms improving, plan for vascular surgery consult for AV fistula creation.  07/01 tolerating well HD.   Assessment and Plan: * Pancytopenia (HCC) Symptomatic anemia.  Recent GI evaluation, recommended conservative management.   Sp PRBC transfusion for a total of 3 since admission.   Possible bone marrow failure considering leukopenia and thrombocytopenia. Poor prognosis.  Not able to perform bone marrow biopsy due to patient not able to stay flat and prone for procedure.  Continue conservative therapy with EPO and as needed PRBC transfusion.  Stop aspirin.  Hgb is 7,0 today, if continue to trend down may need PRBC transfusion.   Acute renal failure superimposed on stage 4 chronic kidney disease (HCC) Hypokalemia. Hyponatremia.   Possible multifactorial.  TDC placed on 06/21.  Continue intermittent renal replacement therapy with ultrafiltration.    Pre HD BUN 39, K 3,4 CL 97, bicarbonate 26. Na 133. P 3.8   Continue intermittent HD. Follow up with vascular surgery for possible fistula creation.  Probably will need higher hgb 8 or above prior to any vascular procedure.   Acute on chronic heart failure with preserved ejection fraction (HFpEF) (HCC) Echocardiogram with preserved LV systolic function. EF 50 to 55%. Moderate LVH, RV systolic function preserved. Mild aortic valve stenosis.   Blood pressure control with amlodipine, carvedilol and hydralazine.  Edema is improving.  Continue ultrafiltration with HD.   Essential hypertension Continue blood pressure control with  carvedilol, amlodipine and hydralazine.   History of CVA (cerebrovascular accident) Continue supportive medical therapy. PT and OT. Patient will return to SNF when medically stable.   Type 2 diabetes mellitus with hyperlipidemia (HCC) Continue insulin sliding scale for  glucose cover and monitoring. Continue to hold on basal insulin.   Fasting glucose today 142 mg/dl.   Continue with statin therapy.   PAD (peripheral artery disease) (HCC) Left BKA.   Depression Continue with PRN alprazolam.  Patient requested to discontinue sertraline due to hallucinations.   Patient with persistent pain, multifactorial.  Continue with oral oxycodone and hydromorphone.   Protein-calorie malnutrition, moderate (HCC) Continue with nutritional supplementation.   Pressure injury of buttock, stage 2 (HCC)  Active Pressure Injury/Wound(s)     Pressure Ulcer  Duration          Pressure Injury 08/22/22 Sacrum Stage 2 -  Partial thickness loss of dermis presenting as a shallow open injury with a red, pink wound bed without slough. skin tear 3 days                  Subjective: Patient with improvement in dyspnea, intermittent chest pain, edema is improving.   Physical Exam: Vitals:   08/26/22 1130 08/26/22 1200 08/26/22 1228 08/26/22 1230  BP: (!) 184/90 (!) 190/93 (!) 188/91 (!) 196/92  Pulse: 73 74 74 74  Resp: 12 15 14 17   Temp:      TempSrc:      SpO2: 100% 100% 100% 100%  Weight:      Height:       Neurology awake and alert ENT with mild pallor Cardiovascular with S1 and S2 present and rhythmic, with positive systolic murmur at the right lower sternal border No JVD Right lower extremity edema + to ++ Respiratory with no rales or wheezing Abdomen with no distention  Data Reviewed:    Family Communication: no family at the bedside   Disposition: Status is: Inpatient Remains inpatient appropriate because: pending fistula creation and transfer to SNF   Planned Discharge Destination: Skilled nursing facility     Author: Coralie Keens, MD 08/26/2022 1:41 PM  For on call review [email or web address].

## 2022-08-27 ENCOUNTER — Inpatient Hospital Stay (HOSPITAL_COMMUNITY): Payer: Medicare (Managed Care)

## 2022-08-27 DIAGNOSIS — N186 End stage renal disease: Secondary | ICD-10-CM | POA: Diagnosis not present

## 2022-08-27 DIAGNOSIS — I10 Essential (primary) hypertension: Secondary | ICD-10-CM | POA: Diagnosis not present

## 2022-08-27 DIAGNOSIS — N179 Acute kidney failure, unspecified: Secondary | ICD-10-CM | POA: Diagnosis not present

## 2022-08-27 DIAGNOSIS — Z8673 Personal history of transient ischemic attack (TIA), and cerebral infarction without residual deficits: Secondary | ICD-10-CM | POA: Diagnosis not present

## 2022-08-27 DIAGNOSIS — D61818 Other pancytopenia: Secondary | ICD-10-CM | POA: Diagnosis not present

## 2022-08-27 LAB — GLUCOSE, CAPILLARY
Glucose-Capillary: 117 mg/dL — ABNORMAL HIGH (ref 70–99)
Glucose-Capillary: 105 mg/dL — ABNORMAL HIGH (ref 70–99)
Glucose-Capillary: 122 mg/dL — ABNORMAL HIGH (ref 70–99)
Glucose-Capillary: 123 mg/dL — ABNORMAL HIGH (ref 70–99)

## 2022-08-27 MED ORDER — DARBEPOETIN ALFA 200 MCG/0.4ML IJ SOSY
200.0000 ug | PREFILLED_SYRINGE | INTRAMUSCULAR | Status: DC
  Administered 2022-08-28: 200 ug via SUBCUTANEOUS
  Filled 2022-08-27 (×2): qty 0.4

## 2022-08-27 MED ORDER — METOCLOPRAMIDE HCL 5 MG/ML IJ SOLN
5.0000 mg | Freq: Three times a day (TID) | INTRAMUSCULAR | Status: DC | PRN
  Administered 2022-08-27: 5 mg via INTRAVENOUS
  Filled 2022-08-27: qty 2

## 2022-08-27 NOTE — Progress Notes (Signed)
Patient ID: [name], male   DOB: [birth year], 62 y.o.   MRN: [id number] S: No events overnight and tolerated HD O:BP (!) 175/86 (BP Location: Left Arm)   Pulse 77   Temp 99 F (37.2 C) (Oral)   Resp 18   Ht 5\' 8"  (1.727 m)   Wt 103.3 kg   SpO2 100%   BMI 34.63 kg/m   Intake/Output Summary (Last 24 hours) at 08/27/2022 1156 Last data filed at 08/27/2022 0504 Gross per 24 hour  Intake 120 ml  Output 3950 ml  Net -3830 ml   Intake/Output: I/O last 3 completed shifts: In: 120 [P.O.:120] Out: 4200 [Urine:700; Other:3500]  Intake/Output this shift:  No intake/output data recorded. Weight change: -11.5 kg Gen: NAD CVS: RRR Resp:CTA Abd: +BS, soft, NT/ND Ext: brawny edema RLE, s/p LBKA  Recent Labs  Lab 08/21/22 0635 08/23/22 0108 08/23/22 0843 08/24/22 0047 08/25/22 0111 08/26/22 0056  NA 137 137 138 135 136 133*  K 3.2* 3.0* 3.0* 3.2* 3.3* 3.4*  CL 99 100 100 99 99 97*  CO2 24 26 26 26 27 26   GLUCOSE 132* 121* 132* 152* 136* 142*  BUN 50* 33* 33* 22 31* 39*  CREATININE 3.74* 3.17* 3.27* 2.43* 3.08* 3.68*  ALBUMIN 2.3* 2.6* 2.6* 2.6* 2.6* 2.5*  CALCIUM 8.1* 8.2* 8.4* 8.2* 8.2* 7.9*  PHOS 4.2 4.0 4.0 3.1 3.9 3.8   Liver Function Tests: Recent Labs  Lab 08/24/22 0047 08/25/22 0111 08/26/22 0056  ALBUMIN 2.6* 2.6* 2.5*   No results for input(s): "LIPASE", "AMYLASE" in the last 168 hours. No results for input(s): "AMMONIA" in the last 168 hours. CBC: Recent Labs  Lab 08/21/22 0635 08/23/22 0843 08/24/22 0703 08/25/22 0705 08/26/22 0056  WBC 1.4* 1.3* 1.3* 1.3*  --   HGB 7.7* 7.7* 7.5* 7.2* 7.0*  HCT 23.8* 24.1* 23.9* 23.2* 22.6*  MCV 89.8 91.3 90.5 91.3  --   PLT 78* 105* 100* 94*  --    Cardiac Enzymes: No results for input(s): "CKTOTAL", "CKMB", "CKMBINDEX", "TROPONINI" in the last 168 hours. CBG: Recent Labs  Lab 08/26/22 1331 08/26/22 1659 08/26/22 2116 08/27/22 0605 08/27/22 1103  GLUCAP 109* 131* 127* 117* 105*    Iron Studies: No  results for input(s): "IRON", "TIBC", "TRANSFERRIN", "FERRITIN" in the last 72 hours. Studies/Results: No results found.  (feeding supplement) PROSource Plus  30 mL Oral TID BM   amLODipine  10 mg Oral Daily   atorvastatin  80 mg Oral QHS   carvedilol  25 mg Oral BID WC   Chlorhexidine Gluconate Cloth  6 each Topical Q0600   darbepoetin (ARANESP) injection - NON-DIALYSIS  100 mcg Subcutaneous Q Wed-1800   feeding supplement  1 Container Oral TID BM   hydrALAZINE  50 mg Oral 4 times per day   insulin aspart  0-5 Units Subcutaneous QHS   insulin aspart  0-6 Units Subcutaneous TID WC   multivitamin  1 tablet Oral QHS   mouth rinse  15 mL Mouth Rinse 4 times per day   pantoprazole  40 mg Oral Daily   sodium chloride flush  3 mL Intravenous Q12H   tamsulosin  0.4 mg Oral QPC supper    BMET    Component Value Date/Time   NA 133 (L) 08/26/2022 0056   NA 135 (L) 12/31/2013 1336   K 3.4 (L) 08/26/2022 0056   K 3.8 12/31/2013 1336   CL 97 (L) 08/26/2022 0056   CL 101 12/31/2013 1336   CO2  26 08/26/2022 0056   CO2 25 12/31/2013 1336   GLUCOSE 142 (H) 08/26/2022 0056   GLUCOSE 249 (H) 12/31/2013 1336   BUN 39 (H) 08/26/2022 0056   BUN 8 12/31/2013 1336   CREATININE 3.68 (H) 08/26/2022 0056   CREATININE 0.88 12/31/2013 1336   CALCIUM 7.9 (L) 08/26/2022 0056   CALCIUM 8.6 12/31/2013 1336   GFRNONAA 18 (L) 08/26/2022 0056   GFRNONAA >60 12/31/2013 1336   GFRAA >60 01/29/2018 1705   GFRAA >60 12/31/2013 1336   CBC    Component Value Date/Time   WBC 1.3 (LL) 08/25/2022 0705   RBC 2.54 (L) 08/25/2022 0705   HGB 7.0 (L) 08/26/2022 0056   HGB 13.9 12/31/2013 1336   HCT 22.6 (L) 08/26/2022 0056   HCT 43.0 12/31/2013 1336   PLT 94 (L) 08/25/2022 0705   PLT 184 12/31/2013 1336   MCV 91.3 08/25/2022 0705   MCV 81 12/31/2013 1336   MCH 28.3 08/25/2022 0705   MCHC 31.0 08/25/2022 0705   RDW 16.7 (H) 08/25/2022 0705   RDW 14.4 12/31/2013 1336   LYMPHSABS 0.5 (L) 08/19/2022 0733    MONOABS 0.1 08/19/2022 0733   EOSABS 0.1 08/19/2022 0733   BASOSABS 0.0 08/19/2022 0733     Assessment/Plan: 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  Patient has had multiple consultation with palliative care and discussed goals of care.  It seems like it was decided to pursue dialysis as per patient interest and monitor.  Based on the current functional status, deconditioning and challenges with mobility I am worried that he may not be a good candidate for outpatient dialysis.  -s/p RIJ TDC placed by IR followed by dialysis on 6/21. Will plan for HD on MWF schedule -CLIP: [facility] MWF 12:20pm chair time.  Tolerated HD in recliner 6/26. Still needs a lot of assistance to get out of bed (hoyer lift, 2+ assist). Will c/w HD in recliner which he fortunately has been tolerating -can plan for permanent access as an outpatient.  Seen by Dr. Karin Lieu who feels that he is too unstable for surgery at this time and will arrange for follow up in a month.     Acute urinary retention: On Flomax and Foley catheter.   Pancytopenia/anemia: hemonc consulted for further evaluation.  Iron saturation 32% with high ferritin.  On ESA. Did not tolerate BM biopsy 6/25. Transfuse PRN for hgb <7.  Will increase aranesp dose.   Acute on chronic diastolic CHF: Treated with IV diuretics initially.  Now managing volume with HD.   HTN/volume: Continue current antihypertensives, managing volume with HD.   Metabolic acidosis: Managed with dialysis, discontinued sodium bicarbonate., bicarb wnl   Hypokalemia: Dialyzing with high potassium bath.  Deconditioning - pt remains very weak and requires much assistance to get out of bed.  Will need PT/OT evaluation and ongoing SNF placement.   DNR/DNI  Irena Cords, MD [facility]

## 2022-08-27 NOTE — Progress Notes (Signed)
      Pending vein mapping and surgical plan for new access. DR. Karin Lieu will f/u on study later today and plans to discuss plan with  Dr. Arrie Aran.    Mosetta Pigeon PA-C [facility]

## 2022-08-27 NOTE — Progress Notes (Signed)
Progress Note   Patient: [name] BJY:[id number] DOB: [birth year] DOA: 08/08/2022     19 DOS: the patient was seen and examined on 08/27/2022   Brief hospital course: Mr. Mettler was admitted to the hospital with the working diagnosis of symptomatic anemia.   62 yo male with the past medical history of hypertension, T2DM, CKD stage IV, peripheral vascular disease, history of CVA and chronic heart failure, who presented from SNF for evaluation of low hgb. Recent hospitalization 05/22 to 05/25 for heart failure exacerbation, and anemia with positive stool occult blood test. Required 3 units PRBC transfusion. No invasive procedure required. Follow up hgb was low and patient was referred to the ED for further evaluation. On his initial physical examination his blood pressure was 144/87, HR 69, RR 18 and 02 saturation 97%, lungs with bilateral rales with no wheezing, heart with S1 and S2 present and rhythmic, no abdominal distention and positive left stump pitting edema.   Na 133 K 3,8 CL 102 bicarbonate 20, glucose 131 BUN 80 cr 5,26  BNP 498  High sensitive troponin 31  Iron 72, transferrin saturation 32, ferritin 628.  Wbc 1,9, hgb 6,9 plt 57   Transfused one unit PRBC with follow up hgb at 7,6  Positive urinary retention, required in and out catheterization.   06/15 continue to have hypervolemia. Further drop in Hgb down to 6,8 and had PRBC transfusion.  06/16 patient with urinary retention but has refused to Foley catheter.   Had [facility] placed and HD started on 6/21.  Nephrology and palliative medicine following.  Oncology consulted and ordered bone marrow biopsy.  Unfortunately, bone marrow biopsy aborted on 6/25 due to orthopnea, desaturation, nausea and emesis.  IR recommends outpatient follow-up.    Tolerated HD in recliner 6/26. Still needs a lot of assistance to get out of bed (hoyer lift, 2+ assist).  Possible permanent access next week, if cell count more stable.   06/29 patient with  improvement in nausea and back pain. Continue very weak and deconditioned. Poor oral intake.  06/30 mentation and gastrointestinal symptoms improving, plan for vascular surgery consult for AV fistula creation.  07/01 tolerating well HD.  07/02 vascular surgery recommended fistula creation as outpatient, once comorbid conditions are more stable.   Assessment and Plan: * Pancytopenia (HCC) Symptomatic anemia.  Recent GI evaluation, recommended conservative management.   Sp PRBC transfusion for a total of 3 since admission.   Possible bone marrow failure considering leukopenia and thrombocytopenia. Poor prognosis.  Not able to perform bone marrow biopsy due to patient not able to stay flat and prone for procedure.  Continue conservative therapy with EPO and as needed PRBC transfusion.  Stop aspirin.  Last hgb was 7.0 Plan to check cell count in am, if below 7 hgb will need PRBC transfusion that can get with hemodialysis.   Acute renal failure superimposed on stage 4 chronic kidney disease (HCC) Hypokalemia. Hyponatremia.   Possible multifactorial.  TDC placed on 06/21.  Continue intermittent renal replacement therapy with ultrafiltration.    Vascular surgery has recommended fistula creation as outpatient, after medical comorbid conditions more stable.   Acute on chronic heart failure with preserved ejection fraction (HFpEF) (HCC) Echocardiogram with preserved LV systolic function. EF 50 to 55%. Moderate LVH, RV systolic function preserved. Mild aortic valve stenosis.   Blood pressure control with amlodipine, carvedilol and hydralazine.  Edema is improving.  Continue ultrafiltration with HD.   Essential hypertension Continue blood pressure control with carvedilol, amlodipine  and hydralazine.   History of CVA (cerebrovascular accident) Continue supportive medical therapy. PT and OT. Patient will return to SNF when medically stable.   Type 2 diabetes mellitus with  hyperlipidemia (HCC) Continue insulin sliding scale for glucose cover and monitoring. Continue to hold on basal insulin.  His glucose has been stable.   Continue with statin therapy.   PAD (peripheral artery disease) (HCC) Left BKA.   Depression Continue with PRN alprazolam.  Patient requested to discontinue sertraline due to hallucinations.   Patient with persistent pain, multifactorial.  Continue with oral oxycodone and hydromorphone.   Protein-calorie malnutrition, moderate (HCC) Continue with nutritional supplementation.   Intermittent nausea and vomiting, will continue with zofran and will add metoclopramide for symptom control.  Pressure injury of buttock, stage 2 (HCC)  Active Pressure Injury/Wound(s)     Pressure Ulcer  Duration          Pressure Injury 08/22/22 Sacrum Stage 2 -  Partial thickness loss of dermis presenting as a shallow open injury with a red, pink wound bed without slough. skin tear 3 days                  Subjective: Patient with persistent nausea, no chest pain or dyspnea, he has intermittent chest pain, he has not been eating well. Tends to decline his po medications   Physical Exam: Vitals:   08/27/22 0206 08/27/22 0458 08/27/22 0727 08/27/22 1105  BP: (!) 190/93 (!) 173/88 (!) 179/82 (!) 175/86  Pulse: 78 76 78 77  Resp: 18 18 18 18   Temp: 97.6 F (36.4 C) 98 F (36.7 C) 99.3 F (37.4 C) 99 F (37.2 C)  TempSrc: Oral Oral Oral Oral  SpO2: 96% 100% 100% 100%  Weight:  103.3 kg    Height:       Neurology awake and alert, deconditioned and ill looking appearing  ENT with mild pallor Cardiovascular with S1 and S2 present, positive systolic murmur at the right lower sternal border Respiratory with no rales or wheezing Abdomen with no distention and non tender Right lower extremity edema is improving, but still ++. Left BKA/  Data Reviewed:    Family Communication: no family at the bedside   Disposition: Status is:  Inpatient Remains inpatient appropriate because: possible transfer to SNF when no further nausea and vomiting   Planned Discharge Destination: Skilled nursing facility    Author: Coralie Keens, MD 08/27/2022 4:24 PM  For on call review [email or web address].

## 2022-08-27 NOTE — Progress Notes (Signed)
Due to the patient's clinical status and comorbidities, he would benefit from further optimization prior to fistula creation.  My plan is to see him in the outpatient setting and 4 weeks.  I discussed this with Dr. Arrie Aran who agrees with this plan.  Appt set  Victorino Sparrow MD

## 2022-08-27 NOTE — Progress Notes (Signed)
PROGRESS NOTE    [name]  PIR:[id number] DOB: 11/11/1960 DOA: 08/08/2022 PCP: [name], MD  62/M w hypertension, T2DM, CKD stage IV, peripheral vascular disease, history of CVA and chronic heart failure, who presented from SNF for evaluation of low hgb. Recent hospitalization 05/22 to 05/25 for heart failure exacerbation, and anemia with positive stool occult blood test. Required 3 units PRBC transfusion. No invasive procedure required. Follow up hgb was low and patient was referred to the ED for further evaluation. positive left stump pitting edema.  -Labs w BUN 80 cr 5,26, BNP 498, Hb 6.9 w/ iron defi , Wbc 1,9, hgb 6,9 plt 57  -Transfused one unit PRBC with follow up hgb at 7,6  -Positive urinary retention, required in and out catheterization.  -6/15 continue to have hypervolemia. Further drop in Hgb down to 6,8 and had PRBC transfusion.  -6/16 patient with urinary retention but refused Foley catheter.  -Had [facility] placed and HD started on 6/21.  Nephrology and palliative medicine following.  Oncology consulted and ordered bone marrow biopsy.  Unfortunately, bone marrow biopsy aborted on 6/25 due to orthopnea, desaturation, nausea and emesis.  IR recommends outpatient follow-up.   -Tolerated HD in recliner 6/26. Still needs a lot of assistance to get out of bed (hoyer lift, 2+ assist).  Possible permanent access next week, if cell count more stable.  -6/29 patient with improvement in nausea and back pain. Continue very weak and deconditioned. Poor oral intake.  -6/30 mentation and gastrointestinal symptoms improving, plan for vascular surgery consult for AV fistula creation.  -7/01 tolerating well HD.  -7/02 vascular surgery recommended fistula creation as outpatient, once comorbid conditions are more stable.   Subjective: -Patient seen on dialysis, feels so-so, no events overnight  Assessment and Plan:  Pancytopenia (HCC) Symptomatic anemia.  Recent GI evaluation,  recommended conservative management.  -Sp PRBC transfusion for a total of 3 since admission.  -Seen by Heme, BM biopsy recommended -IR consulted, 6/25 Not able to perform bone marrow biopsy due to patient not able to stay flat and prone for procedure.  Continue conservative therapy with EPO and as needed PRBC transfusion.  Hemoglobin 7.3 today -Discussed reattempting bone marrow biopsy now that he is stable with patient again today, he will let me know  Acute renal failure superimposed on stage 4 chronic kidney disease (HCC) New ESRD Hypokalemia. Hyponatremia.  -multifactorial.  -TDC placed on 06/21.  -Continue HD per renal Vascular surgery has recommended fistula creation as outpatient, after medical comorbid conditions more stable.   Acute on chronic heart failure with preserved ejection fraction (HFpEF) (HCC) Echocardiogram with preserved LV systolic function. EF 50 to 55%. Moderate LVH, RV systolic function preserved. Mild aortic valve stenosis.  -continue amlodipine, carvedilol and hydralazine.  -Volume managed with HD  Essential hypertension Continue carvedilol, amlodipine and hydralazine.   History of CVA (cerebrovascular accident) Continue supportive medical therapy. PT and OT. Patient will return to SNF when medically stable.   Type 2 diabetes mellitus with hyperlipidemia (HCC) Continue insulin sliding scale for glucose cover and monitoring. Continue to hold on basal insulin.  His glucose has been stable.    PAD (peripheral artery disease) (HCC) Left BKA.   Depression Continue with PRN alprazolam.  Patient requested to discontinue sertraline due to hallucinations.   Patient with persistent pain, multifactorial.  Continue with oral oxycodone and hydromorphone.   Protein-calorie malnutrition, moderate (HCC) Continue with nutritional supplementation.   Intermittent nausea and vomiting, will continue with zofran and  will add metoclopramide for symptom  control.  Pressure injury of buttock, stage 2 (HCC)  Active Pressure Injury/Wound(s)     Pressure Ulcer  Duration          Pressure Injury 08/22/22 Sacrum Stage 2 -  Partial thickness loss of dermis presenting as a shallow open injury with a red, pink wound bed without slough. skin tear 3 days            DVT prophylaxis: SCDs Code Status: DNR Family Communication: No family at bedside Disposition Plan: Back to [place] SNF pending workup  Consultants:    Procedures:   Antimicrobials:    Objective: Vitals:   08/27/22 0458 08/27/22 0727 08/27/22 1105 08/27/22 1550  BP: (!) 173/88 (!) 179/82 (!) 175/86 (!) 154/79  Pulse: 76 78 77 72  Resp: 18 18 18 18   Temp: 98 F (36.7 C) 99.3 F (37.4 C) 99 F (37.2 C)   TempSrc: Oral Oral Oral   SpO2: 100% 100% 100% 97%  Weight: 103.3 kg     Height:        Intake/Output Summary (Last 24 hours) at 08/27/2022 1644 Last data filed at 08/27/2022 1431 Gross per 24 hour  Intake --  Output 575 ml  Net -575 ml   Filed Weights   08/25/22 0500 08/26/22 0433 08/27/22 0458  Weight: 110.1 kg 114.8 kg 103.3 kg    Examination:    Data Reviewed:   CBC: Recent Labs  Lab 08/21/22 0635 08/23/22 0843 08/24/22 0703 08/25/22 0705 08/26/22 0056  WBC 1.4* 1.3* 1.3* 1.3*  --   HGB 7.7* 7.7* 7.5* 7.2* 7.0*  HCT 23.8* 24.1* 23.9* 23.2* 22.6*  MCV 89.8 91.3 90.5 91.3  --   PLT 78* 105* 100* 94*  --    Basic Metabolic Panel: Recent Labs  Lab 08/21/22 0635 08/23/22 0108 08/23/22 0843 08/24/22 0047 08/25/22 0111 08/26/22 0056  NA 137 137 138 135 136 133*  K 3.2* 3.0* 3.0* 3.2* 3.3* 3.4*  CL 99 100 100 99 99 97*  CO2 24 26 26 26 27 26   GLUCOSE 132* 121* 132* 152* 136* 142*  BUN 50* 33* 33* 22 31* 39*  CREATININE 3.74* 3.17* 3.27* 2.43* 3.08* 3.68*  CALCIUM 8.1* 8.2* 8.4* 8.2* 8.2* 7.9*  MG 1.8  --   --   --   --   --   PHOS 4.2 4.0 4.0 3.1 3.9 3.8   GFR: Estimated Creatinine Clearance: 24.6 mL/min (A) (by C-G formula based on  SCr of 3.68 mg/dL (H)). Liver Function Tests: Recent Labs  Lab 08/23/22 0108 08/23/22 0843 08/24/22 0047 08/25/22 0111 08/26/22 0056  ALBUMIN 2.6* 2.6* 2.6* 2.6* 2.5*   No results for input(s): "LIPASE", "AMYLASE" in the last 168 hours. No results for input(s): "AMMONIA" in the last 168 hours. Coagulation Profile: No results for input(s): "INR", "PROTIME" in the last 168 hours. Cardiac Enzymes: No results for input(s): "CKTOTAL", "CKMB", "CKMBINDEX", "TROPONINI" in the last 168 hours. BNP (last 3 results) No results for input(s): "PROBNP" in the last 8760 hours. HbA1C: No results for input(s): "HGBA1C" in the last 72 hours. CBG: Recent Labs  Lab 08/26/22 1659 08/26/22 2116 08/27/22 0605 08/27/22 1103 08/27/22 1547  GLUCAP 131* 127* 117* 105* 122*   Lipid Profile: No results for input(s): "CHOL", "HDL", "LDLCALC", "TRIG", "CHOLHDL", "LDLDIRECT" in the last 72 hours. Thyroid Function Tests: No results for input(s): "TSH", "T4TOTAL", "FREET4", "T3FREE", "THYROIDAB" in the last 72 hours. Anemia Panel: No results for input(s): "VITAMINB12", "FOLATE", "  FERRITIN", "TIBC", "IRON", "RETICCTPCT" in the last 72 hours. Urine analysis:    Component Value Date/Time   COLORURINE YELLOW 08/09/2022 0530   APPEARANCEUR CLEAR 08/09/2022 0530   APPEARANCEUR Clear 12/31/2013 2116   LABSPEC 1.025 08/09/2022 0530   LABSPEC 1.032 12/31/2013 2116   PHURINE 5.5 08/09/2022 0530   GLUCOSEU NEGATIVE 08/09/2022 0530   GLUCOSEU >=500 12/31/2013 2116   HGBUR NEGATIVE 08/09/2022 0530   BILIRUBINUR NEGATIVE 08/09/2022 0530   BILIRUBINUR Negative 12/31/2013 2116   KETONESUR NEGATIVE 08/09/2022 0530   PROTEINUR 100 (A) 08/09/2022 0530   UROBILINOGEN 1.0 08/28/2013 1709   NITRITE NEGATIVE 08/09/2022 0530   LEUKOCYTESUR NEGATIVE 08/09/2022 0530   LEUKOCYTESUR Negative 12/31/2013 2116   Sepsis Labs: @LABRCNTIP (procalcitonin:4,lacticidven:4)  )No results found for this or any previous visit  (from the past 240 hour(s)).   Radiology Studies: VAS [place] UPPER EXT VEIN MAPPING (PRE-OP AVF)  Result Date: 08/27/2022 UPPER EXTREMITY VEIN MAPPING Patient Name:  PARIN BONAM  Date of Exam:   08/27/2022 Medical Rec #: [id number]      Accession #:    [id number] Date of Birth: [birth year]       Patient Gender: M Patient Age:   65 years Exam Location:  [facility] Procedure:      VAS [place] UPPER EXT VEIN MAPPING (PRE-OP AVF) Referring Phys: EMMA COLLINS --------------------------------------------------------------------------------  Indications: Pre-access. Limitations: Patient positioning and inability to hold position for exam Comparison Study: No previous study. Performing Technologist: McKayla Maag RVT, [place]  Examination Guidelines: A complete evaluation includes B-mode imaging, spectral Doppler, color Doppler, and power Doppler as needed of all accessible portions of each vessel. Bilateral testing is considered an integral part of a complete examination. Limited examinations for reoccurring indications may be performed as noted. +-----------------+-------------+----------+---------+ Right Cephalic   Diameter (cm)Depth (cm)Findings  +-----------------+-------------+----------+---------+ Shoulder             0.53        0.45             +-----------------+-------------+----------+---------+ Prox upper arm       0.48        0.39             +-----------------+-------------+----------+---------+ Mid upper arm        0.45        0.33             +-----------------+-------------+----------+---------+ Dist upper arm       0.50        0.37             +-----------------+-------------+----------+---------+ Antecubital fossa    0.47        0.35   branching +-----------------+-------------+----------+---------+ Prox forearm         0.41        0.49             +-----------------+-------------+----------+---------+ Mid forearm          0.37        0.43              +-----------------+-------------+----------+---------+ Dist forearm         0.33        0.32             +-----------------+-------------+----------+---------+ +-----------------+-------------+----------+---------+ Right Basilic    Diameter (cm)Depth (cm)Findings  +-----------------+-------------+----------+---------+ Mid upper arm        0.66        0.97             +-----------------+-------------+----------+---------+  Dist upper arm       0.37        0.59   branching +-----------------+-------------+----------+---------+ Antecubital fossa    0.24        0.55   branching +-----------------+-------------+----------+---------+ Prox forearm         0.27        0.38   branching +-----------------+-------------+----------+---------+ Mid forearm          0.30        0.22             +-----------------+-------------+----------+---------+ Distal forearm       0.20        0.30             +-----------------+-------------+----------+---------+ +-----------------+-------------+----------+---------+ Left Cephalic    Diameter (cm)Depth (cm)Findings  +-----------------+-------------+----------+---------+ Shoulder             0.45        0.51             +-----------------+-------------+----------+---------+ Prox upper arm       0.53        0.30             +-----------------+-------------+----------+---------+ Mid upper arm                              IV     +-----------------+-------------+----------+---------+ Dist upper arm       0.35        0.31             +-----------------+-------------+----------+---------+ Antecubital fossa    0.63        0.23   branching +-----------------+-------------+----------+---------+ Prox forearm         0.23        0.40   branching +-----------------+-------------+----------+---------+ Mid forearm          0.26        0.21             +-----------------+-------------+----------+---------+ Dist forearm          0.22        0.24             +-----------------+-------------+----------+---------+ +-----------------+-------------+----------+--------------+ Left Basilic     Diameter (cm)Depth (cm)   Findings    +-----------------+-------------+----------+--------------+ Mid upper arm        0.73        0.67                  +-----------------+-------------+----------+--------------+ Dist upper arm       0.78        0.40                  +-----------------+-------------+----------+--------------+ Antecubital fossa    0.34        0.21     branching    +-----------------+-------------+----------+--------------+ Prox forearm         0.21        0.25                  +-----------------+-------------+----------+--------------+ Mid forearm                             not visualized +-----------------+-------------+----------+--------------+ Distal forearm                          not visualized +-----------------+-------------+----------+--------------+ *See table(s) above for measurements and observations.  Diagnosing physician:    Preliminary      Scheduled Meds:  (feeding supplement) PROSource Plus  30 mL Oral TID BM   amLODipine  10 mg Oral Daily   atorvastatin  80 mg Oral QHS   carvedilol  25 mg Oral BID WC   Chlorhexidine Gluconate Cloth  6 each Topical Q0600   [START ON 08/28/2022] darbepoetin (ARANESP) injection - NON-DIALYSIS  200 mcg Subcutaneous Q Wed-1800   feeding supplement  1 Container Oral TID BM   hydrALAZINE  50 mg Oral 4 times per day   insulin aspart  0-5 Units Subcutaneous QHS   insulin aspart  0-6 Units Subcutaneous TID WC   multivitamin  1 tablet Oral QHS   mouth rinse  15 mL Mouth Rinse 4 times per day   pantoprazole  40 mg Oral Daily   sodium chloride flush  3 mL Intravenous Q12H   tamsulosin  0.4 mg Oral QPC supper   Continuous Infusions:   LOS: 19 days    Time spent:    Zannie Cove, MD [facility]   08/27/2022, 4:44 PM

## 2022-08-27 NOTE — Progress Notes (Signed)
Bilateral upper extremity vein mapping study completed.   Please see CV Procedures for preliminary results.  Ionna Avis, RVT  3:03 PM 08/27/22

## 2022-08-28 DIAGNOSIS — D61818 Other pancytopenia: Secondary | ICD-10-CM | POA: Diagnosis not present

## 2022-08-28 LAB — GLUCOSE, CAPILLARY
Glucose-Capillary: 134 mg/dL — ABNORMAL HIGH (ref 70–99)
Glucose-Capillary: 101 mg/dL — ABNORMAL HIGH (ref 70–99)
Glucose-Capillary: 146 mg/dL — ABNORMAL HIGH (ref 70–99)
Glucose-Capillary: 162 mg/dL — ABNORMAL HIGH (ref 70–99)

## 2022-08-28 LAB — BASIC METABOLIC PANEL
Anion gap: 8 (ref 5–15)
BUN: 29 mg/dL — ABNORMAL HIGH (ref 8–23)
CO2: 27 mmol/L (ref 22–32)
Calcium: 8 mg/dL — ABNORMAL LOW (ref 8.9–10.3)
Chloride: 98 mmol/L (ref 98–111)
Creatinine, Ser: 3.21 mg/dL — ABNORMAL HIGH (ref 0.61–1.24)
GFR, Estimated: 21 mL/min — ABNORMAL LOW (ref >60)
Glucose, Bld: 123 mg/dL — ABNORMAL HIGH (ref 70–99)
Potassium: 3.6 mmol/L (ref 3.5–5.1)
Sodium: 133 mmol/L — ABNORMAL LOW (ref 135–145)

## 2022-08-28 LAB — CBC
HCT: 23.4 % — ABNORMAL LOW (ref 39.0–52.0)
Hemoglobin: 7.3 g/dL — ABNORMAL LOW (ref 13.0–17.0)
MCH: 28.6 pg (ref 26.0–34.0)
MCHC: 31.2 g/dL (ref 30.0–36.0)
MCV: 91.8 fL (ref 80.0–100.0)
Platelets: 112 10*3/uL — ABNORMAL LOW (ref 150–400)
RBC: 2.55 MIL/uL — ABNORMAL LOW (ref 4.22–5.81)
RDW: 16 % — ABNORMAL HIGH (ref 11.5–15.5)
WBC: 1.3 10*3/uL — CL (ref 4.0–10.5)
nRBC: 0 % (ref 0.0–0.2)

## 2022-08-28 MED ORDER — HEPARIN SODIUM (PORCINE) 1000 UNIT/ML IJ SOLN
INTRAMUSCULAR | Status: AC
  Filled 2022-08-28: qty 4

## 2022-08-28 MED ORDER — ANTICOAGULANT SODIUM CITRATE 4% (200MG/5ML) IV SOLN: 5.0000 mL | Status: DC | PRN

## 2022-08-28 MED ORDER — ALTEPLASE 2 MG IJ SOLR: 2.0000 mg | Freq: Once | INTRAMUSCULAR | Status: DC | PRN

## 2022-08-28 MED ORDER — HEPARIN SODIUM (PORCINE) 1000 UNIT/ML DIALYSIS
1000.0000 [IU] | INTRAMUSCULAR | Status: DC | PRN
  Administered 2022-08-28: 1000 [IU]
  Administered 2022-08-30: 3800 [IU]
  Filled 2022-08-28: qty 1

## 2022-08-28 NOTE — Progress Notes (Signed)
   08/28/22 1307  Vitals  Temp 98.7 F (37.1 C)  Pulse Rate 73  Resp 17  BP (!) 176/83  SpO2 100 %  O2 Device Nasal Cannula  Oxygen Therapy  O2 Flow Rate (L/min) 2 L/min  Patient Activity (if Appropriate) In bed  Pulse Oximetry Type Continuous  Post Treatment  Dialyzer Clearance Clear  Duration of HD Treatment -hour(s) 3.5 hour(s)  Hemodialysis Intake (mL) 0 mL  Liters Processed 84  Fluid Removed (mL) 3500 mL  Tolerated HD Treatment Yes  Post-Hemodialysis Comments Pt. tolerated Hemodialysis well without difficulties. Report given to [facility] bedside  RN Jerel Shepherd. Pt VSS and no distress noted

## 2022-08-28 NOTE — Progress Notes (Signed)
   08/28/22 1307  Vitals  Temp 98.7 F (37.1 C)  Pulse Rate 73  Resp 17  BP (!) 176/83  SpO2 100 %  O2 Device Nasal Cannula  Oxygen Therapy  O2 Flow Rate (L/min) 2 L/min  Patient Activity (if Appropriate) In bed  Pulse Oximetry Type Continuous  Post Treatment  Dialyzer Clearance Clear  Duration of HD Treatment -hour(s) 3.5 hour(s)  Hemodialysis Intake (mL) 0 mL  Liters Processed 84  Fluid Removed (mL) 3500 mL  Tolerated HD Treatment Yes  Post-Hemodialysis Comments Pt. tolerated Hemodialysis well without difficulties. Report given to [facility] bedside  RN Jerel Shepherd. Pt VSS and no distress noted   Received patient in bed to unit.  Alert and oriented.  Informed consent signed and in chart.   TX duration:3.5  Patient tolerated well.  Transported back to the room  Alert, without acute distress.  Hand-off given to patient's nurse.   Access used: Yes Access issues:  Yes, pt clotted off machine and had to do a new set-up  Total UF removed: 3500 Medication(s) given: See MAR Post HD VS: See Above Grid Post HD weight: 105.8 kg   [facility]

## 2022-08-28 NOTE — Progress Notes (Addendum)
Contacted attending and CSW to inquire about possible d/c date. Will need to alert HD clinic if pt to d/c today or tomorrow regarding pt starting at clinic on Friday. Attending to see pt and provide update as able/needed. Will assist as needed.   Olivia Canter Renal Navigator [phone]  Addendum at 2:52 pm: Pt's out-pt HD arrangements added to AVS in the event pt stable for d/c tomorrow. Pt can start at clinic [facility]) on Friday and will need to arrive at 11:20 for paperwork. Contacted renal PA to be made aware of out-pt HD arrangements and to request orders be faxed at d/c. Contacted clinic to make them aware that pt's d/c date is unknown and will f/u on Friday morning to provide an update as to whether pt was stable for d/c or not on Thursday.

## 2022-08-28 NOTE — Procedures (Signed)
I was present at this dialysis session. I have reviewed the session itself and made appropriate changes.   Vital signs in last 24 hours:  Temp:  [98.1 F (36.7 C)-99 F (37.2 C)] 98.1 F (36.7 C) (07/03 0827) Pulse Rate:  [68-77] 68 (07/03 0934) Resp:  [12-20] 16 (07/03 0934) BP: (129-181)/(61-87) 149/76 (07/03 0934) SpO2:  [97 %-100 %] 100 % (07/03 0934) Weight:  [104.1 kg-109.6 kg] 109.6 kg (07/03 0834) Weight change: 0.8 kg Filed Weights   08/27/22 0458 08/28/22 0338 08/28/22 0834  Weight: 103.3 kg 104.1 kg 109.6 kg    Recent Labs  Lab 08/26/22 0056 08/28/22 0024  NA 133* 133*  K 3.4* 3.6  CL 97* 98  CO2 26 27  GLUCOSE 142* 123*  BUN 39* 29*  CREATININE 3.68* 3.21*  CALCIUM 7.9* 8.0*  PHOS 3.8  --     Recent Labs  Lab 08/24/22 0703 08/25/22 0705 08/26/22 0056 08/28/22 0024  WBC 1.3* 1.3*  --  1.3*  HGB 7.5* 7.2* 7.0* 7.3*  HCT 23.9* 23.2* 22.6* 23.4*  MCV 90.5 91.3  --  91.8  PLT 100* 94*  --  112*    Scheduled Meds:  (feeding supplement) PROSource Plus  30 mL Oral TID BM   amLODipine  10 mg Oral Daily   atorvastatin  80 mg Oral QHS   carvedilol  25 mg Oral BID WC   Chlorhexidine Gluconate Cloth  6 each Topical Q0600   darbepoetin (ARANESP) injection - NON-DIALYSIS  200 mcg Subcutaneous Q Wed-1800   feeding supplement  1 Container Oral TID BM   hydrALAZINE  50 mg Oral 4 times per day   insulin aspart  0-5 Units Subcutaneous QHS   insulin aspart  0-6 Units Subcutaneous TID WC   multivitamin  1 tablet Oral QHS   mouth rinse  15 mL Mouth Rinse 4 times per day   pantoprazole  40 mg Oral Daily   sodium chloride flush  3 mL Intravenous Q12H   tamsulosin  0.4 mg Oral QPC supper   Continuous Infusions:  anticoagulant sodium citrate     PRN Meds:.acetaminophen **OR** acetaminophen, albuterol, ALPRAZolam, alteplase, anticoagulant sodium citrate, heparin, HYDROmorphone (DILAUDID) injection, loperamide, metoCLOPramide (REGLAN) injection, ondansetron (ZOFRAN)  IV, mouth rinse, mouth rinse, oxyCODONE, white petrolatum   Irena Cords,  MD 08/28/2022, 9:39 AM

## 2022-08-28 NOTE — Plan of Care (Signed)

## 2022-08-28 NOTE — Progress Notes (Signed)
CSW continuing to follow for dc needs. Pt can return to [facility] when he is medically stable.

## 2022-08-28 NOTE — Consult Note (Signed)
Chief Complaint: Patient was seen in consultation today for pancytopenia  Referring Physician(s): Zannie Cove, MD  Supervising Physician: Roanna Banning  Patient Status: [name] - In-pt  History of Present Illness: [place] RENY is a 62 y.o. male with PMH significant for chronic diastolic CHF, bipolar affective disorder, diabetes mellitus, and hypertension being seen today in relation to pancytopenia. Patient had previously been brought to IR for image-guided bone marrow biopsy on 08/20/22 but was unable to tolerate positioning at that time. IR has been consulted again for bone marrow biopsy to further evaluate his pancytopenia.  Past Medical History:  Diagnosis Date   Acute on chronic diastolic CHF (congestive heart failure) (HCC) 08/22/2020   Acute on chronic heart failure with preserved ejection fraction (HCC)    Acute renal failure superimposed on stage 3a chronic kidney disease (HCC) 11/18/2020   Arthritis    Bacteremia due to Enterococcus 12/09/2021   Bipolar affective (HCC)    CHF (congestive heart failure) (HCC)    Claustrophobia    Severe   Cocaine abuse (HCC) 05/31/2014   Cocaine dependence, uncomplicated (HCC) 05/31/2014   Depression    Diabetes mellitus    uncontrolled    Elevated CK    Heart murmur    Hypertension    Ileus (HCC) 11/18/2020   Metabolic acidosis 10/23/2021   Non-traumatic rhabdomyolysis 01/01/2020   Pericardial effusion 08/22/2020   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   Suicidal ideations 08/28/2013    Past Surgical History:  Procedure Laterality Date   ABDOMINAL AORTOGRAM W/LOWER EXTREMITY Left 03/02/2021   Procedure: ABDOMINAL AORTOGRAM W/LOWER EXTREMITY;  Surgeon: Leonie Douglas, MD;  Location: [facility] INVASIVE CV LAB;  Service: Cardiovascular;  Laterality: Left;   AMPUTATION Left 03/07/2021   Procedure: LEFT BELOW KNEE AMPUTATION;  Surgeon: Leonie Douglas, MD;  Location: [facility] OR;  Service: Vascular;   Laterality: Left;   APPLICATION OF WOUND VAC Left 03/07/2021   Procedure: APPLICATION OF WOUND VAC;  Surgeon: Leonie Douglas, MD;  Location: [facility] OR;  Service: Vascular;  Laterality: Left;   BOWEL DECOMPRESSION N/A 01/19/2020   Procedure: BOWEL DECOMPRESSION;  Surgeon: Kathi Der, MD;  Location: [facility] ENDOSCOPY;  Service: Gastroenterology;  Laterality: N/A;   BOWEL DECOMPRESSION N/A 11/19/2020   Procedure: BOWEL DECOMPRESSION;  Surgeon: Charlott Rakes, MD;  Location: [facility] ENDOSCOPY;  Service: Endoscopy;  Laterality: N/A;   BOWEL DECOMPRESSION N/A 12/03/2020   Procedure: BOWEL DECOMPRESSION;  Surgeon: Kerin Salen, MD;  Location: [facility] ENDOSCOPY;  Service: Gastroenterology;  Laterality: N/A;   COLONOSCOPY WITH PROPOFOL N/A 01/19/2020   Procedure: COLONOSCOPY WITH PROPOFOL;  Surgeon: Kathi Der, MD;  Location: [facility] ENDOSCOPY;  Service: Gastroenterology;  Laterality: N/A;   COLONOSCOPY WITH PROPOFOL N/A 11/19/2020   Procedure: COLONOSCOPY WITH PROPOFOL;  Surgeon: Charlott Rakes, MD;  Location: [facility] ENDOSCOPY;  Service: Endoscopy;  Laterality: N/A;   COLONOSCOPY WITH PROPOFOL N/A 12/03/2020   Procedure: COLONOSCOPY WITH PROPOFOL;  Surgeon: Kerin Salen, MD;  Location: [facility] ENDOSCOPY;  Service: Gastroenterology;  Laterality: N/A;   IR FLUORO GUIDE CV LINE RIGHT  08/16/2022   IR US GUIDE VASC ACCESS RIGHT  08/16/2022   KNEE SURGERY     b/l knees; pending total knee    RADIOLOGY WITH ANESTHESIA N/A 07/04/2020   Procedure: MRI WITH ANESTHESIA BRAIN WITHOUT CONTRAST AND HEAD WITHOUT CONTRAST;  Surgeon: Radiologist, Medication, MD;  Location: [facility] OR;  Service: Radiology;  Laterality: N/A;   TRANSMETATARSAL AMPUTATION Left 03/01/2021   Procedure: TRANSMETATARSAL AMPUTATION;  Surgeon:  Vivi Barrack, DPM;  Location: [facility] OR;  Service: Podiatry;  Laterality: Left;    Allergies: Other and Sertraline  Medications: Prior to Admission medications   Medication Sig Start Date End Date Taking? Authorizing  Provider  acetaminophen (TYLENOL) 650 MG CR tablet Take 650 mg by mouth every 4 (four) hours as needed (for discomfort- cannot exceed 3,000 mg from all combined sources/24 hours).   Yes [provider]  albuterol (VENTOLIN HFA) 108 (90 Base) MCG/ACT inhaler Inhale 2 puffs into the lungs every 6 (six) hours as needed for wheezing or shortness of breath.   Yes [provider]  amLODipine (NORVASC) 10 MG tablet Take 1 tablet (10 mg total) by mouth daily. Patient taking differently: Take 10 mg by mouth See admin instructions. Take 10 mg by mouth once a day and hold if Systolic is <100 or Diastolic <84. Call NP is B/P is over 160/90. 12/11/21  Yes [name], [name], DO  aspirin 81 MG chewable tablet Chew 81 mg by mouth daily.   Yes [provider]  atorvastatin (LIPITOR) 80 MG tablet Take 80 mg by mouth at bedtime.   Yes [provider]  brimonidine (ALPHAGAN) 0.2 % ophthalmic solution Place 1 drop into both eyes 2 (two) times daily.   Yes [provider]  carvedilol (COREG) 12.5 MG tablet Take 1 tablet (12.5 mg total) by mouth 2 (two) times daily with a meal. 04/02/22  Yes [name], [name], MD  docusate sodium (COLACE) 100 MG capsule Take 100 mg by mouth at bedtime.   Yes [provider]  dorzolamide (TRUSOPT) 2 % ophthalmic solution Place 1 drop into both eyes 2 (two) times daily.   Yes [provider]  ferrous sulfate 325 (65 FE) MG tablet Take 1 tablet (325 mg total) by mouth daily. 09/23/20  Yes [name], [name], DO  hydrALAZINE (APRESOLINE) 50 MG tablet Take 1 tablet (50 mg total) by mouth every 8 (eight) hours. Patient taking differently: Take 50 mg by mouth See admin instructions. Take 50 mg by mouth at 4 AM, 10 AM, 4 PM, and 10 PM- hold for a Systolic reading less than 100 04/02/22  Yes [name], [name], MD  insulin aspart (NOVOLOG FLEXPEN) 100 UNIT/ML FlexPen Inject 2-10 Units into the skin 3 (three) times daily before meals. Inject  2-10 units into the skin three times a day before meals, per sliding scale: BGL 131-180 = 2 units; 181-240 = 4 units; 241-300 = 6 units; 301-350 = 8 units; 351-400 = 10 units; >400 = CALL MD   Yes [provider]  LANTUS SOLOSTAR 100 UNIT/ML Solostar Pen Inject 6 Units into the skin in the morning.   Yes [provider]  latanoprost (XALATAN) 0.005 % ophthalmic solution Place 1 drop into both eyes at bedtime.   Yes [provider]  Multiple Vitamin (MULTIVITAMIN) tablet Take 1 tablet by mouth daily.   Yes [provider]  ondansetron (ZOFRAN) 4 MG tablet Take 4 mg by mouth every 4 (four) hours as needed for nausea or vomiting.   Yes [provider]  OXYGEN Inhale 2 L/min into the lungs as needed (for sats below 90%).   Yes [provider]  pantoprazole (PROTONIX) 40 MG tablet Take 1 tablet (40 mg total) by mouth 2 (two) times daily. 07/20/22  Yes [name], Eric J, DO  potassium chloride SA (KLOR-CON M) 20 MEQ tablet Take 20 mEq by mouth in the morning.   Yes [provider]  pregabalin (LYRICA) 75 MG capsule Take 1 capsule (75 mg total) by mouth daily. 10/31/21  Yes Maxwell, Allee, MD  PROTEIN PO Take 30 mLs by mouth 2 (two) times daily.   Yes [provider]  PROTEIN PO Take 30 mLs by mouth 2 (two) times daily. 0900 and 2100   Yes [provider]  sertraline (ZOLOFT) 50 MG tablet Take 50 mg by mouth every other day.   Yes [provider]  sodium bicarbonate 650 MG tablet Take 2 tablets (1,300 mg total) by mouth 2 (two) times daily. 10/31/21  Yes Alfredo Martinez, MD  tamsulosin (FLOMAX) 0.4 MG CAPS capsule Take 0.8 mg by mouth every evening.   Yes [provider]  torsemide (DEMADEX) 20 MG tablet Take 40-80 mg by mouth See admin instructions. Take 80 mg by mouth at 8 AM and 40 mg by mouth at 7 PM   Yes [provider]     Family History  Problem Relation Age of Onset   Diabetes Other    Cancer  Mother        uterus; deceased    Heart attack Mother 78   Hypertension Other    Heart attack Father 45   Stroke Sister        12/2012 age 65 y.o    Social History   Socioeconomic History   Marital status: Single    Spouse name: Not on file   Number of children: Not on file   Years of education: Not on file   Highest education level: Not on file  Occupational History   Not on file  Tobacco Use   Smoking status: Former   Smokeless tobacco: Never  Vaping Use   Vaping Use: Never used  Substance and Sexual Activity   Alcohol use: No   Drug use: Not Currently    Types: Cocaine    Comment: Last use 2019   Sexual activity: Yes    Birth control/protection: Condom  Other Topics Concern   Not on file  Social History Narrative   Moved from [place]   Denies smoking, drinking   +cocaine history (denies recent use)   Originally from [place]    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[employer]   No kids, married                      Social Determinants of Corporate investment banker Strain: Not on file  Food Insecurity: No Food Insecurity (07/18/2022)   Hunger Vital Sign    Worried About Running Out of Food in the Last Year: Never true    Ran Out of Food in the Last Year: Never true  Transportation Needs: No Transportation Needs (07/18/2022)   PRAPARE - Administrator, Civil Service (Medical): No    Lack of Transportation (Non-Medical): No  Physical Activity: Not on file  Stress: Not on file  Social Connections: Not on file    Code Status: Full code per patient  Review of Systems: A 12 point ROS discussed and pertinent positives are indicated in the HPI above.  All other systems are negative.  Review of Systems  Constitutional:  Negative for chills and fever.  Respiratory:  Negative for chest tightness and shortness of breath.   Cardiovascular:  Positive for leg swelling. Negative for chest pain.  Gastrointestinal:   Positive for nausea. Negative for abdominal pain, diarrhea and vomiting.  Neurological:  Negative for  dizziness and headaches.  Psychiatric/Behavioral:  Negative for confusion.     Vital Signs: BP (!) 150/69 (BP Location: Left Wrist)   Pulse 73   Temp 98.7 F (37.1 C)   Resp 18   Ht 5\' 8"  (1.727 m)   Wt 241 lb 10 oz (109.6 kg)   SpO2 97%   BMI 36.74 kg/m     Physical Exam Vitals reviewed.  Constitutional:      General: He is not in acute distress.    Appearance: He is ill-appearing.  Cardiovascular:     Rate and Rhythm: Normal rate and regular rhythm.     Pulses: Normal pulses.     Heart sounds: Normal heart sounds.  Pulmonary:     Effort: Pulmonary effort is normal.     Breath sounds: Normal breath sounds.  Abdominal:     Palpations: Abdomen is soft.     Tenderness: There is no abdominal tenderness.  Musculoskeletal:     Right lower leg: Edema present.     Comments: Left BKA  Skin:    General: Skin is warm and dry.  Neurological:     Mental Status: He is alert and oriented to person, place, and time.  Psychiatric:        Mood and Affect: Mood normal.        Behavior: Behavior normal.        Thought Content: Thought content normal.        Judgment: Judgment normal.     Imaging: VAS [place] UPPER EXT VEIN MAPPING (PRE-OP AVF)  Result Date: 08/27/2022 UPPER EXTREMITY VEIN MAPPING Patient Name:  [name]  Date of Exam:   08/27/2022 Medical Rec #: [id number]      Accession #:    [id number] Date of Birth: 14-May-1960       Patient Gender: M Patient Age:   35 years Exam Location:  [facility] Procedure:      VAS [place] UPPER EXT VEIN MAPPING (PRE-OP AVF) Referring Phys: EMMA COLLINS --------------------------------------------------------------------------------  Indications: Pre-access. Limitations: Patient positioning and inability to hold position for exam Comparison Study: No previous study. Performing Technologist: McKayla Maag RVT, [place]  Examination Guidelines: A  complete evaluation includes B-mode imaging, spectral Doppler, color Doppler, and power Doppler as needed of all accessible portions of each vessel. Bilateral testing is considered an integral part of a complete examination. Limited examinations for reoccurring indications may be performed as noted. +-----------------+-------------+----------+---------+ Right Cephalic   Diameter (cm)Depth (cm)Findings  +-----------------+-------------+----------+---------+ Shoulder             0.53        0.45             +-----------------+-------------+----------+---------+ Prox upper arm       0.48        0.39             +-----------------+-------------+----------+---------+ Mid upper arm        0.45        0.33             +-----------------+-------------+----------+---------+ Dist upper arm       0.50        0.37             +-----------------+-------------+----------+---------+ Antecubital fossa    0.47        0.35   branching +-----------------+-------------+----------+---------+ Prox forearm         0.41        0.49             +-----------------+-------------+----------+---------+  Mid forearm          0.37        0.43             +-----------------+-------------+----------+---------+ Dist forearm         0.33        0.32             +-----------------+-------------+----------+---------+ +-----------------+-------------+----------+---------+ Right Basilic    Diameter (cm)Depth (cm)Findings  +-----------------+-------------+----------+---------+ Mid upper arm        0.66        0.97             +-----------------+-------------+----------+---------+ Dist upper arm       0.37        0.59   branching +-----------------+-------------+----------+---------+ Antecubital fossa    0.24        0.55   branching +-----------------+-------------+----------+---------+ Prox forearm         0.27        0.38   branching  +-----------------+-------------+----------+---------+ Mid forearm          0.30        0.22             +-----------------+-------------+----------+---------+ Distal forearm       0.20        0.30             +-----------------+-------------+----------+---------+ +-----------------+-------------+----------+---------+ Left Cephalic    Diameter (cm)Depth (cm)Findings  +-----------------+-------------+----------+---------+ Shoulder             0.45        0.51             +-----------------+-------------+----------+---------+ Prox upper arm       0.53        0.30             +-----------------+-------------+----------+---------+ Mid upper arm                              IV     +-----------------+-------------+----------+---------+ Dist upper arm       0.35        0.31             +-----------------+-------------+----------+---------+ Antecubital fossa    0.63        0.23   branching +-----------------+-------------+----------+---------+ Prox forearm         0.23        0.40   branching +-----------------+-------------+----------+---------+ Mid forearm          0.26        0.21             +-----------------+-------------+----------+---------+ Dist forearm         0.22        0.24             +-----------------+-------------+----------+---------+ +-----------------+-------------+----------+--------------+ Left Basilic     Diameter (cm)Depth (cm)   Findings    +-----------------+-------------+----------+--------------+ Mid upper arm        0.73        0.67                  +-----------------+-------------+----------+--------------+ Dist upper arm       0.78        0.40                  +-----------------+-------------+----------+--------------+ Antecubital fossa    0.34        0.21     branching    +-----------------+-------------+----------+--------------+  Prox forearm         0.21        0.25                   +-----------------+-------------+----------+--------------+ Mid forearm                             not visualized +-----------------+-------------+----------+--------------+ Distal forearm                          not visualized +-----------------+-------------+----------+--------------+ *See table(s) above for measurements and observations.  Diagnosing physician: Waverly Ferrari MD Electronically signed by Waverly Ferrari MD on 08/27/2022 at 6:05:30 PM.    Final    DG Chest Port 1 View  Result Date: 08/20/2022 CLINICAL DATA:  Hypoxemia. EXAM: PORTABLE CHEST 1 VIEW COMPARISON:  Chest radiographs 08/08/2022, 07/17/2022, 03/23/2022 FINDINGS: New right internal jugular dual-lumen central venous catheter tips overlie the mid height of the right atrium. Cardiac silhouette is again moderately enlarged. Mediastinal contours within normal limits. Mildly decreased lung volumes. Mild bibasilar bronchovascular crowding. Cephalization of the pulmonary vasculature without overt pulmonary edema. Improved aeration with resolution of the prior bilateral patchy airspace opacities and interstitial thickening. No pleural effusion or pneumothorax. No acute skeletal abnormality. IMPRESSION: 1. Cardiomegaly with cephalization of the pulmonary vasculature as can be seen with pulmonary venous hypertension. No overt pulmonary edema. 2. Improved aeration with resolution of the prior bilateral patchy airspace opacities and interstitial thickening. This appears to reflect interval improvement of the prior interstitial and alveolar pulmonary edema. Electronically Signed   By: Neita Garnet M.D.   On: 08/20/2022 18:50   IR Fluoro Guide CV Line Right  Result Date: 08/16/2022 INDICATION: End-stage renal disease. In need of durable intravenous access for the initiation hemodialysis. EXAM: TUNNELED CENTRAL VENOUS HEMODIALYSIS CATHETER PLACEMENT WITH ULTRASOUND AND FLUOROSCOPIC GUIDANCE MEDICATIONS: Patient is currently  admitted to the hospital receiving intravenous antibiotics. The antibiotic was given in an appropriate time interval prior to skin puncture. ANESTHESIA/SEDATION: Moderate (conscious) sedation was employed during this procedure. A total of Versed 1.5 mg and Fentanyl 50 mcg was administered intravenously. Moderate Sedation Time: 11 minutes. The patient's level of consciousness and vital signs were monitored continuously by radiology nursing throughout the procedure under my direct supervision. FLUOROSCOPY TIME:  36 seconds (11 mGy) COMPLICATIONS: None immediate. PROCEDURE: Informed written consent was obtained from the patient after a discussion of the risks, benefits, and alternatives to treatment. Questions regarding the procedure were encouraged and answered. The right neck and chest were prepped with chlorhexidine in a sterile fashion, and a sterile drape was applied covering the operative field. Maximum barrier sterile technique with sterile gowns and gloves were used for the procedure. A timeout was performed prior to the initiation of the procedure. After creating a small venotomy incision, a micropuncture kit was utilized to access the internal jugular vein. Real-time ultrasound guidance was utilized for vascular access including the acquisition of a permanent ultrasound image documenting patency of the accessed vessel. The microwire was utilized to measure appropriate catheter length. A stiff Glidewire was advanced to the level of the IVC and the micropuncture sheath was exchanged for a peel-away sheath. A palindrome tunneled hemodialysis catheter measuring 23 cm from tip to cuff was tunneled in a retrograde fashion from the anterior chest wall to the venotomy incision. The catheter was then placed through the peel-away sheath with tips ultimately positioned within the superior aspect  of the right atrium. Final catheter positioning was confirmed and documented with a spot radiographic image. The catheter  aspirates and flushes normally. The catheter was flushed with appropriate volume heparin dwells. The catheter exit site was secured with a 0-Prolene retention suture. The venotomy incision was closed with Dermabond and Steri-strips. Dressings were applied. The patient tolerated the procedure well without immediate post procedural complication. IMPRESSION: Successful placement of 23 cm tip to cuff tunneled hemodialysis catheter via the right internal jugular vein with tips terminating within the superior aspect of the right atrium. The catheter is ready for immediate use. Electronically Signed   By: Simonne Come M.D.   On: 08/16/2022 16:37   IR US Guide Vasc Access Right  Result Date: 08/16/2022 INDICATION: End-stage renal disease. In need of durable intravenous access for the initiation hemodialysis. EXAM: TUNNELED CENTRAL VENOUS HEMODIALYSIS CATHETER PLACEMENT WITH ULTRASOUND AND FLUOROSCOPIC GUIDANCE MEDICATIONS: Patient is currently admitted to the hospital receiving intravenous antibiotics. The antibiotic was given in an appropriate time interval prior to skin puncture. ANESTHESIA/SEDATION: Moderate (conscious) sedation was employed during this procedure. A total of Versed 1.5 mg and Fentanyl 50 mcg was administered intravenously. Moderate Sedation Time: 11 minutes. The patient's level of consciousness and vital signs were monitored continuously by radiology nursing throughout the procedure under my direct supervision. FLUOROSCOPY TIME:  36 seconds (11 mGy) COMPLICATIONS: None immediate. PROCEDURE: Informed written consent was obtained from the patient after a discussion of the risks, benefits, and alternatives to treatment. Questions regarding the procedure were encouraged and answered. The right neck and chest were prepped with chlorhexidine in a sterile fashion, and a sterile drape was applied covering the operative field. Maximum barrier sterile technique with sterile gowns and gloves were used for the  procedure. A timeout was performed prior to the initiation of the procedure. After creating a small venotomy incision, a micropuncture kit was utilized to access the internal jugular vein. Real-time ultrasound guidance was utilized for vascular access including the acquisition of a permanent ultrasound image documenting patency of the accessed vessel. The microwire was utilized to measure appropriate catheter length. A stiff Glidewire was advanced to the level of the IVC and the micropuncture sheath was exchanged for a peel-away sheath. A palindrome tunneled hemodialysis catheter measuring 23 cm from tip to cuff was tunneled in a retrograde fashion from the anterior chest wall to the venotomy incision. The catheter was then placed through the peel-away sheath with tips ultimately positioned within the superior aspect of the right atrium. Final catheter positioning was confirmed and documented with a spot radiographic image. The catheter aspirates and flushes normally. The catheter was flushed with appropriate volume heparin dwells. The catheter exit site was secured with a 0-Prolene retention suture. The venotomy incision was closed with Dermabond and Steri-strips. Dressings were applied. The patient tolerated the procedure well without immediate post procedural complication. IMPRESSION: Successful placement of 23 cm tip to cuff tunneled hemodialysis catheter via the right internal jugular vein with tips terminating within the superior aspect of the right atrium. The catheter is ready for immediate use. Electronically Signed   By: Simonne Come M.D.   On: 08/16/2022 16:37   DG Chest 2 View  Result Date: 08/08/2022 CLINICAL DATA:  [id number] with dyspnea. EXAM: CHEST - 2 VIEW COMPARISON:  Portable chest 07/17/2022, CT chest no contrast 03/17/2022 FINDINGS: The heart is moderately enlarged. Perihilar vascular congestion and edema are again noted. There is patchy airspace disease in the lung fields which could be  airspace  edema, pneumonia or combination. There are small layering pleural effusions. Mediastinum is normally outlined. There are degenerative changes and mild dextroscoliosis of the thoracic spine. Similar findings were noted on both prior studies. IMPRESSION: 1. Cardiomegaly with perihilar vascular congestion and edema. 2. Patchy airspace disease in the lung fields which could be airspace edema, pneumonia or combination. 3. Small layering pleural effusions. 4. Clinical correlation and radiographic follow-up recommended. Electronically Signed   By: Almira Bar M.D.   On: 08/08/2022 21:18    Labs:  CBC: Recent Labs    08/23/22 0843 08/24/22 0703 08/25/22 0705 08/26/22 0056 08/28/22 0024  WBC 1.3* 1.3* 1.3*  --  1.3*  HGB 7.7* 7.5* 7.2* 7.0* 7.3*  HCT 24.1* 23.9* 23.2* 22.6* 23.4*  PLT 105* 100* 94*  --  112*    COAGS: Recent Labs    12/08/21 1919  INR 1.3*    BMP: Recent Labs    08/24/22 0047 08/25/22 0111 08/26/22 0056 08/28/22 0024  NA 135 136 133* 133*  K 3.2* 3.3* 3.4* 3.6  CL 99 99 97* 98  CO2 26 27 26 27   GLUCOSE 152* 136* 142* 123*  BUN 22 31* 39* 29*  CALCIUM 8.2* 8.2* 7.9* 8.0*  CREATININE 2.43* 3.08* 3.68* 3.21*  GFRNONAA 30* 22* 18* 21*    LIVER FUNCTION TESTS: Recent Labs    03/17/22 1440 03/18/22 0404 03/25/22 0935 07/17/22 1815 08/08/22 2028 08/14/22 0129 08/23/22 0843 08/24/22 0047 08/25/22 0111 08/26/22 0056  BILITOT 0.5 0.4  --  0.7 0.6  --   --   --   --   --   AST 16 10*  --  19 28  --   --   --   --   --   ALT 18 15  --  18 27  --   --   --   --   --   ALKPHOS 115 91  --  94 73  --   --   --   --   --   PROT 7.0 6.4*  --  8.2* 7.7  --   --   --   --   --   ALBUMIN 2.7* 2.5*   < > 3.1* 2.8*   < > 2.6* 2.6* 2.6* 2.5*   < > = values in this interval not displayed.    TUMOR MARKERS: No results for input(s): "AFPTM", "CEA", "CA199", "CHROMGRNA" in the last 8760 hours.  Assessment and Plan:  Jearl Retterer is a 62 yo male being seen  today in relation to pancytopenia. IR has been consulted to perform image-guided bone marrow biopsy. Case reviewed and approved by Dr Milford Cage. Patient has had CBC ordered and been made NPO for morning of 08/30/22. Patient to undergo procedure on 08/30/22.  Risks and benefits of image-guided bone marrow biopsy was discussed with the patient and/or patient's family including, but not limited to bleeding, infection, damage to adjacent structures or low yield requiring additional tests.  All of the questions were answered and there is agreement to proceed.  Consent signed and in chart.   Thank you for this interesting consult.  I greatly enjoyed meeting ETHERIDGE WHITMARSH and look forward to participating in their care.  A copy of this report was sent to the requesting provider on this date.  Electronically Signed: Kennieth Francois, PA-C 08/28/2022, 3:38 PM   I spent a total of 20 Minutes in face to face in clinical consultation, greater than 50% of which was  counseling/coordinating care for pancytopenia.

## 2022-08-29 DIAGNOSIS — D61818 Other pancytopenia: Secondary | ICD-10-CM | POA: Diagnosis not present

## 2022-08-29 LAB — GLUCOSE, CAPILLARY
Glucose-Capillary: 110 mg/dL — ABNORMAL HIGH (ref 70–99)
Glucose-Capillary: 138 mg/dL — ABNORMAL HIGH (ref 70–99)
Glucose-Capillary: 133 mg/dL — ABNORMAL HIGH (ref 70–99)
Glucose-Capillary: 155 mg/dL — ABNORMAL HIGH (ref 70–99)

## 2022-08-29 IMAGING — MR MR HEAD W/O CM
12 of 14 series · 40 of 48 positions shown · non-contrast
Comparison: Prior CT from 07/02/2020.

CLINICAL DATA: Initial evaluation for acute binocular visual loss,
right greater than left.

EXAM:
MRI HEAD AND ORBITS WITHOUT CONTRAST
TECHNIQUE: Multiplanar, multi-echo pulse sequences of the brain and surrounding
structures were acquired without intravenous contrast. Multiplanar,
multi-echo pulse sequences of the orbits and surrounding structures
were acquired including fat saturation techniques, without
intravenous contrast administration.

[Series 5: DWI · axial · 3.0mm · 0.96mm/px · z∈[-77,+80]mm · 6 of 107 slices shown (1 of 4)]
[im 1/107]
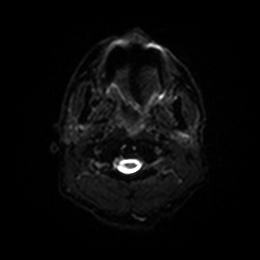
[im 22/107]
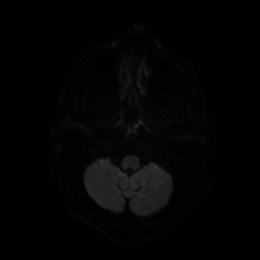
[im 43/107]
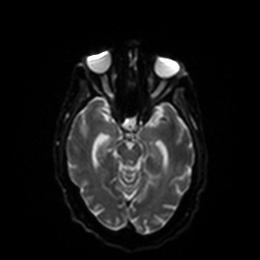
[im 64/107]
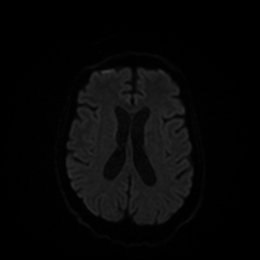
[im 85/107]
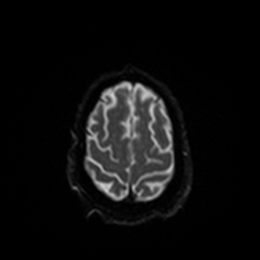
[im 107/107]
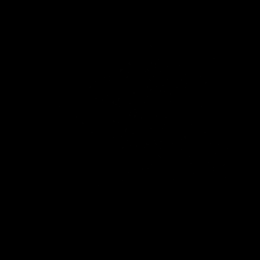

[Series 6: DWI · axial · 3.0mm · 0.96mm/px · z∈[-77,+74]mm · 3 of 52 slices shown (2 of 4)]
[im 1/52]
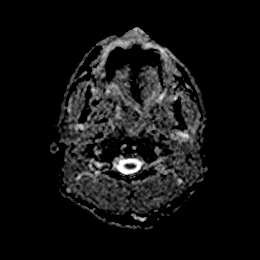
[im 26/52]
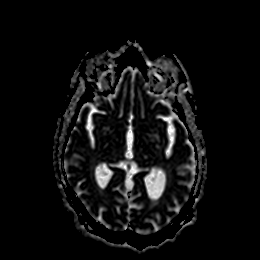
[im 52/52]
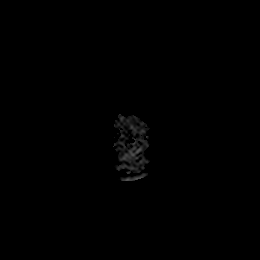

[Series 7: DWI · coronal · 4.0mm · 0.88mm/px · 6 of 80 slices shown (3 of 4)]
[im 1/80]
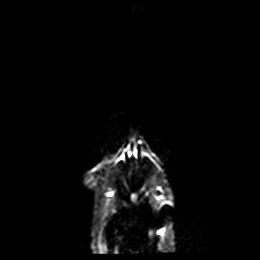
[im 16/80]
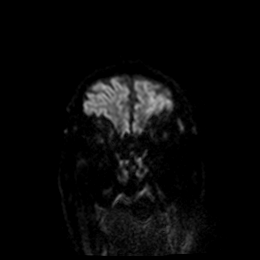
[im 32/80]
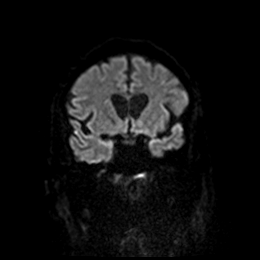
[im 48/80]
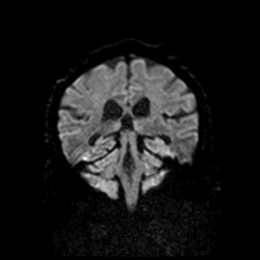
[im 64/80]
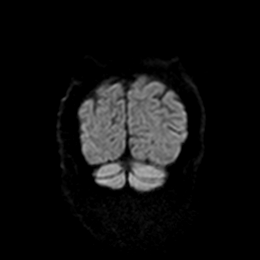
[im 80/80]
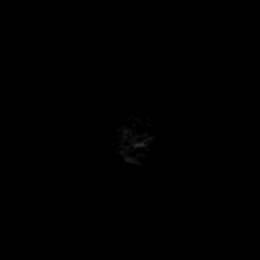

[Series 8: DWI · coronal · 4.0mm · 0.88mm/px · 3 of 40 slices shown (4 of 4)]
[im 1/40]
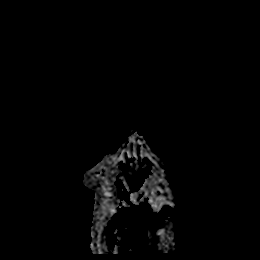
[im 20/40]
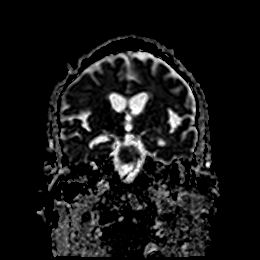
[im 40/40]
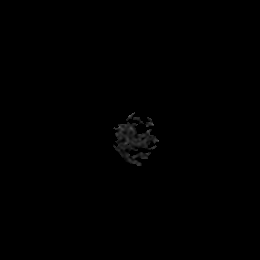

[Series 9: T1 · sagittal · 5.0mm · 0.78mm/px · 2 of 28 slices shown]
[im 1/28]
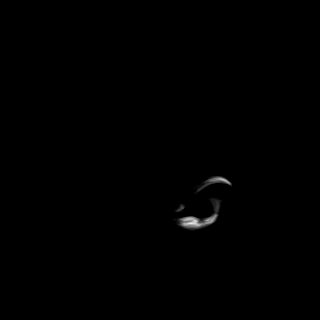
[im 28/28]
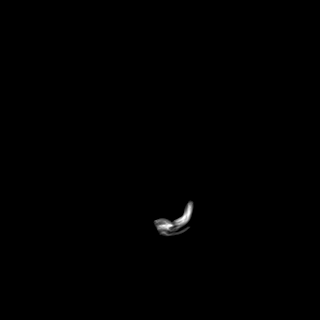

[Series 10: T2 · axial · 5.0mm · 0.72mm/px · z∈[-80,+80]mm · 2 of 28 slices shown]
[im 1/28]
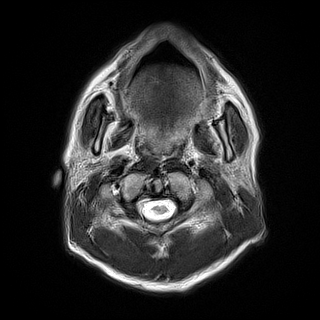
[im 28/28]
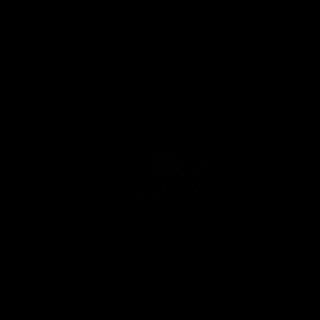

[Series 11: FLAIR · axial · 5.0mm · 0.47mm/px · z∈[-83,+77]mm · 2 of 28 slices shown (1 of 2)]
[im 1/28]
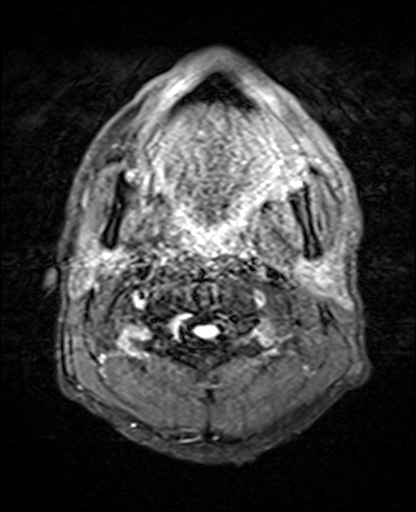
[im 28/28]
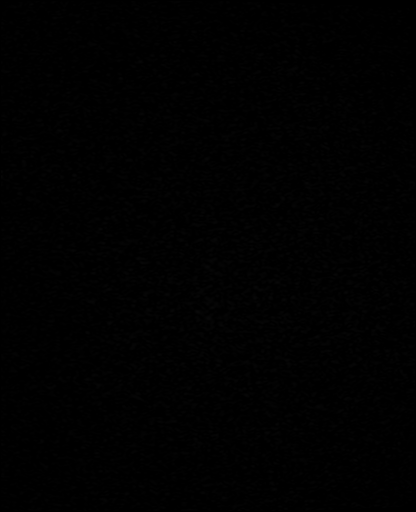

[Series 12: mag_images · axial · 3.0mm · 0.90mm/px · z∈[-79,+72]mm · 4 of 52 slices shown]
[im 1/52]
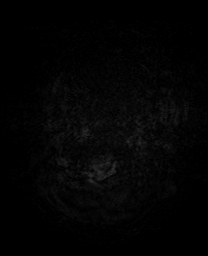
[im 18/52]
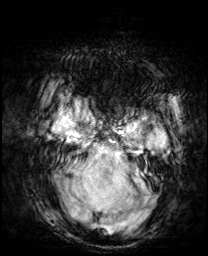
[im 35/52]
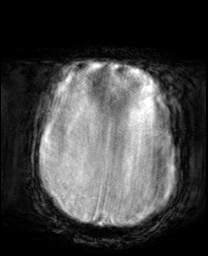
[im 52/52]
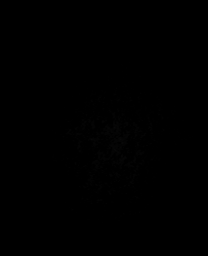

[Series 13: pha_images · axial · 3.0mm · 0.90mm/px · z∈[-79,+63]mm · 3 of 49 slices shown]
[im 1/49]
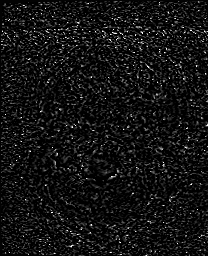
[im 25/49]
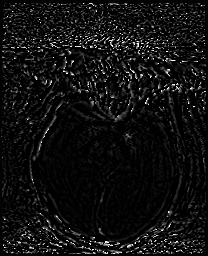
[im 49/49]
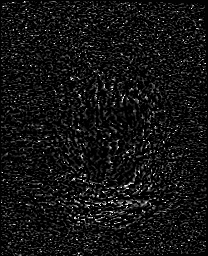

[Series 14: swi_images · axial · 3.0mm · 0.90mm/px · z∈[-79,+72]mm · 4 of 52 slices shown]
[im 1/52]
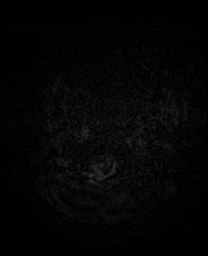
[im 18/52]
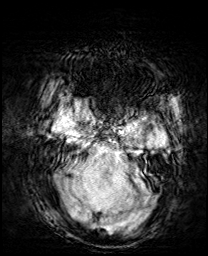
[im 35/52]
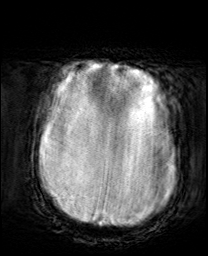
[im 52/52]
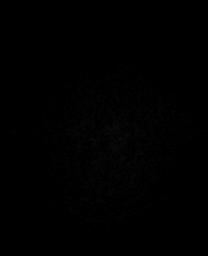

[Series 15: mip_images(sw) · axial · 24.0mm · 0.90mm/px · z∈[-69,+61]mm · 3 of 45 slices shown]
[im 1/45]
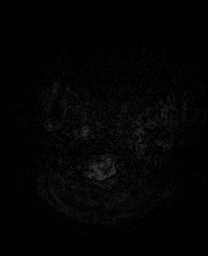
[im 23/45]
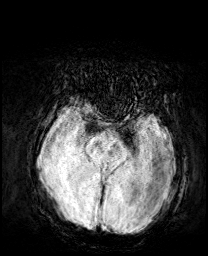
[im 45/45]
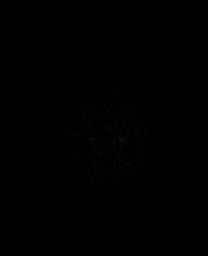

[Series 20: FLAIR · axial · 5.0mm · 0.47mm/px · z∈[-95,+66]mm · 2 of 28 slices shown (2 of 2)]
[im 1/28]
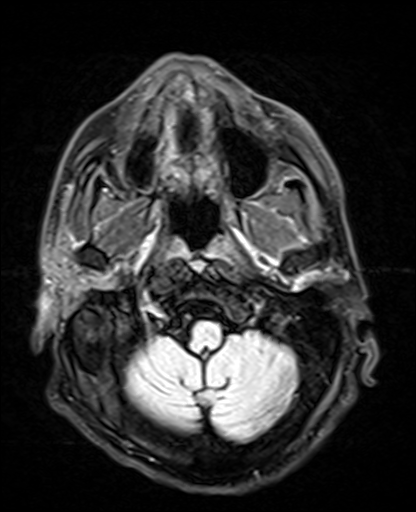
[im 28/28]
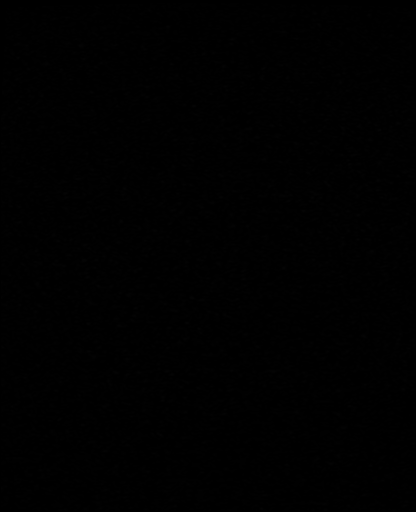

[40 of 48 positions shown; findings below may reference images not displayed]

FINDINGS: MRI HEAD FINDINGS

Brain: Examination moderately to severely degraded by motion
artifact.

Mild diffuse prominence of the CSF containing spaces compatible
generalized cerebral atrophy. Patchy and confluent T2/FLAIR
hyperintensity involving the periventricular deep white matter both
cerebral hemispheres as well as the pons, consistent with chronic
microvascular ischemic disease, moderate in nature. Few scatter
remote lacunar infarcts present about the bilateral basal ganglia,
left thalamus, and left pons.

No abnormal foci of restricted diffusion to suggest acute or
subacute ischemia. Gray-white matter differentiation maintained. No
encephalomalacia to suggest chronic cortical infarction. No definite
foci of susceptibility artifact to suggest acute or chronic
intracranial hemorrhage on this motion degraded exam.

No visible mass lesion, mass effect or midline shift. No
hydrocephalus or extra-axial fluid collection. Pituitary gland
suprasellar region normal. Midline structures intact.

Vascular: Major intracranial vascular flow voids are maintained at
the skull base. Left vertebral artery hypoplastic.

Skull and upper cervical spine: Craniocervical junction within
normal limits. Postsurgical changes partially visualize within the
upper cervical spine.

Other: No mastoid effusion.  Inner ear structures grossly normal.

MRI ORBITS FINDINGS

Orbits: Examination severely degraded by motion artifact, markedly
limiting assessment.

Globes are grossly symmetric with normal appearance bilaterally. No
obvious abnormality on this motion degraded exam. Optic nerves
grossly symmetric without visible abnormality. No visible
abnormality about the orbital apices or cavernous sinus. Optic
chiasm normally situated within the suprasellar cistern on
corresponding brain MRI. Visualized optic radiations grossly normal.
No visible abnormality about the cavernous sinus. Superior orbital
veins grossly symmetric and within normal limits. Intraconal and
extraconal fat maintained. Extra-ocular muscles symmetric and within
normal limits. Lacrimal glands within normal limits. No acute
abnormality.

Visualized sinuses: Mild scattered mucosal thickening noted within
the sphenoid ethmoidal sinuses. Partially visualized paranasal
sinuses are otherwise grossly clear.

Soft tissues: Grossly unremarkable.
IMPRESSION: 1. Technically limited exam due to extensive motion artifact.
2. No acute intracranial abnormality. No obvious abnormality about
either orbit.
3. Generalized cerebral atrophy with moderate chronic microvascular
ischemic disease, with multiple scattered remote lacunar infarcts
about the bilateral basal ganglia, left thalamus, and pons.

## 2022-08-29 IMAGING — MR MR ORBITS W/O CM
7 of 8 series · 42 of 48 positions shown · non-contrast
Comparison: Prior CT from 07/02/2020.

CLINICAL DATA: Initial evaluation for acute binocular visual loss,
right greater than left.

EXAM:
MRI HEAD AND ORBITS WITHOUT CONTRAST
TECHNIQUE: Multiplanar, multi-echo pulse sequences of the brain and surrounding
structures were acquired without intravenous contrast. Multiplanar,
multi-echo pulse sequences of the orbits and surrounding structures
were acquired including fat saturation techniques, without
intravenous contrast administration.

[Series 6: T1 · axial · non-contrast · 3.0mm · 0.39mm/px · z∈[-60,+14]mm · 4 of 25 slices shown]
[im 1/25]
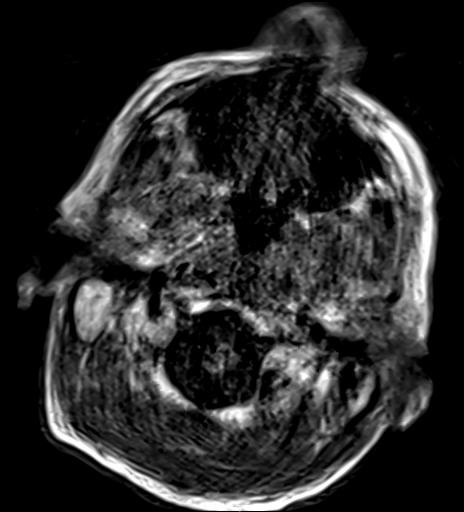
[im 7/25]
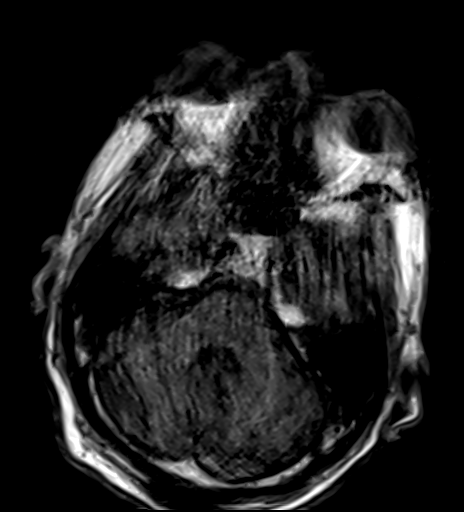
[im 13/25]
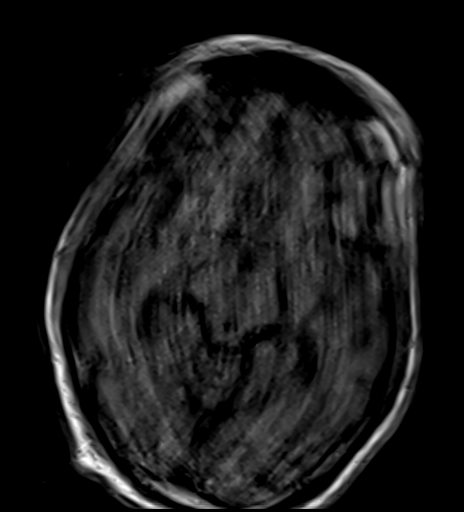
[im 19/25]
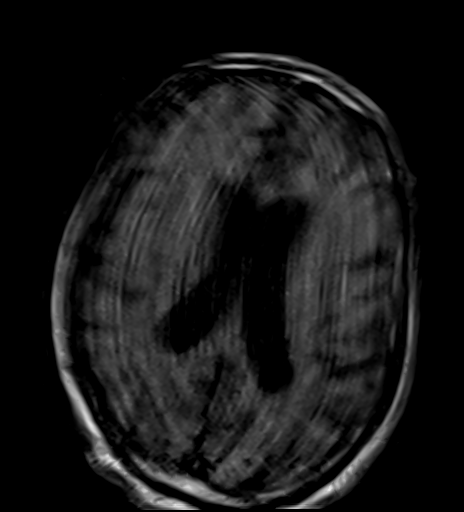

[Series 8: T2 fat-sat · axial · 3.0mm · 0.78mm/px · z∈[-70,+30]mm · 5 of 25 slices shown (1 of 6)]
[im 1/25]
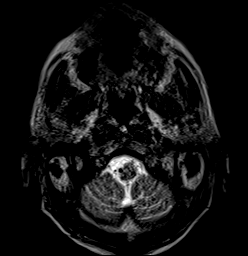
[im 7/25]
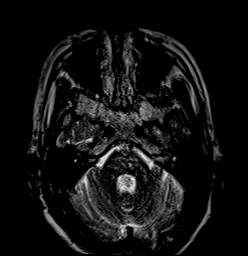
[im 13/25]
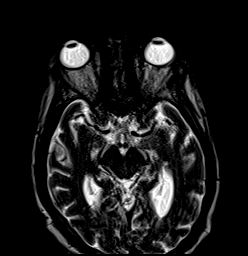
[im 19/25]
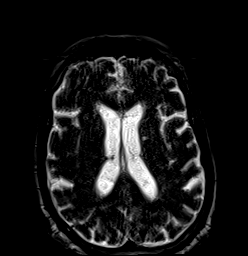
[im 25/25]
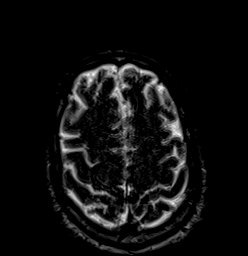

[Series 9: T2 fat-sat · axial · 3.0mm · 0.78mm/px · z∈[-70,+30]mm · 6 of 25 slices shown (2 of 6)]
[im 1/25]
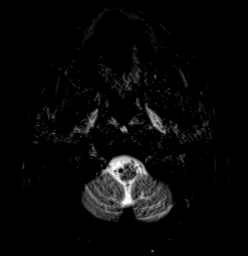
[im 5/25]
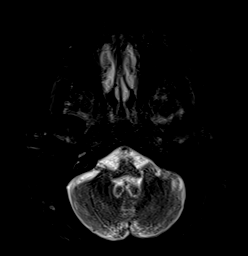
[im 10/25]
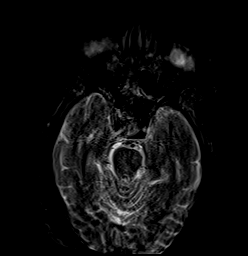
[im 15/25]
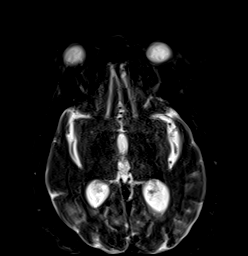
[im 20/25]
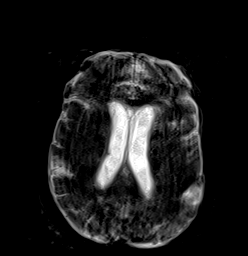
[im 25/25]
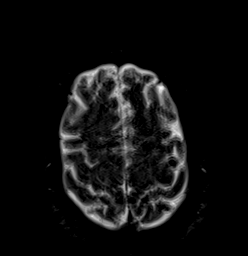

[Series 10: T2 fat-sat · axial · 3.0mm · 0.78mm/px · z∈[-70,+30]mm · 6 of 25 slices shown (3 of 6)]
[im 1/25]
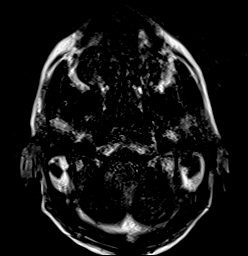
[im 5/25]
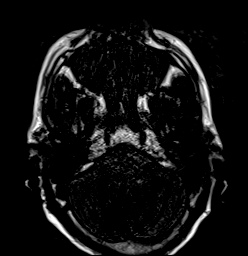
[im 10/25]
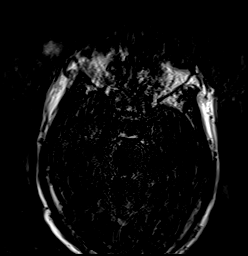
[im 15/25]
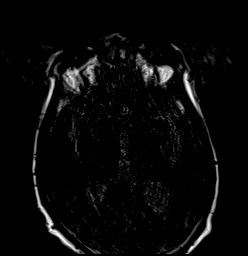
[im 20/25]
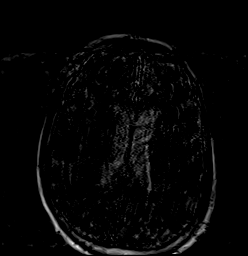
[im 25/25]
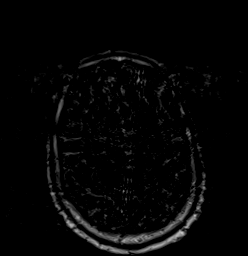

[Series 11: T2 fat-sat · coronal · 3.0mm · 0.59mm/px · 7 of 30 slices shown (4 of 6)]
[im 1/30]
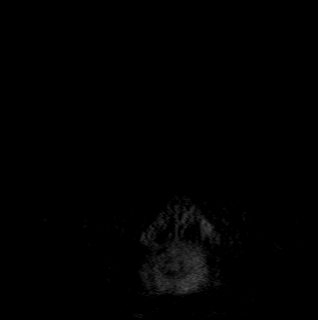
[im 5/30]
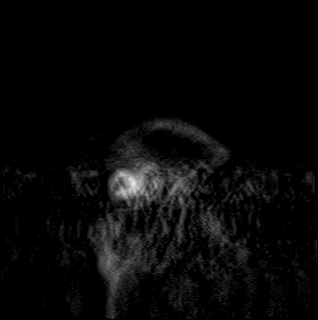
[im 10/30]
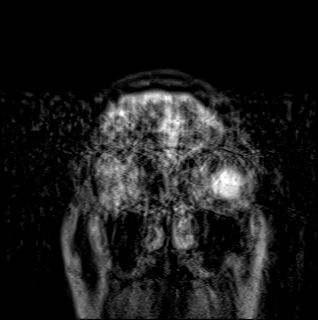
[im 15/30]
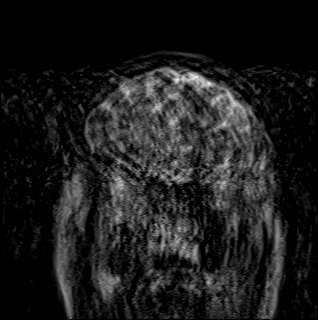
[im 20/30]
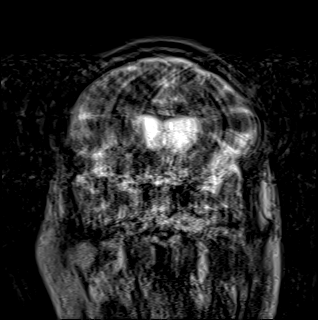
[im 25/30]
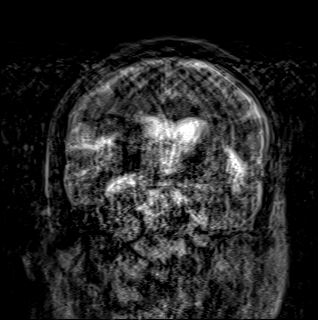
[im 30/30]
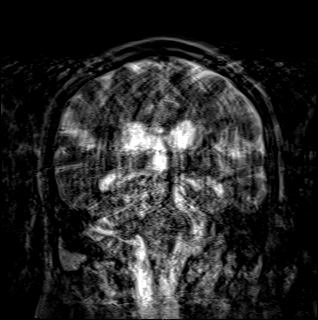

[Series 12: T2 fat-sat · coronal · 3.0mm · 0.59mm/px · 7 of 30 slices shown (5 of 6)]
[im 1/30]
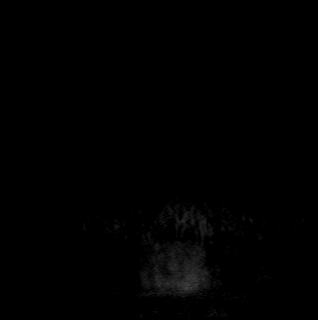
[im 5/30]
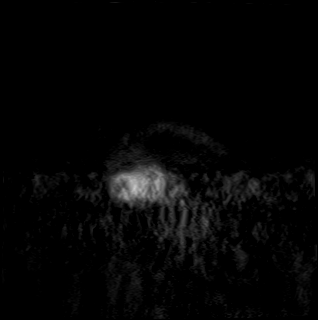
[im 10/30]
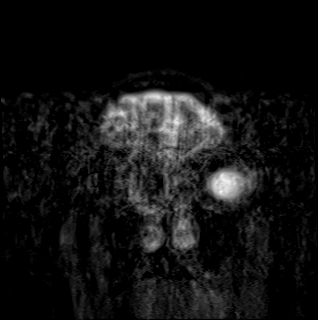
[im 15/30]
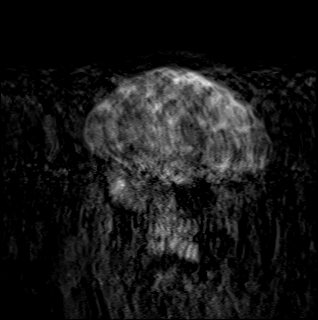
[im 20/30]
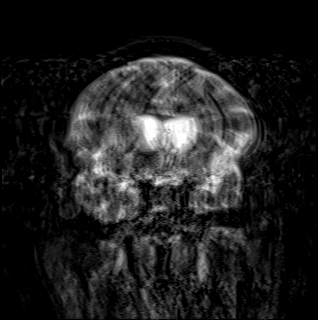
[im 25/30]
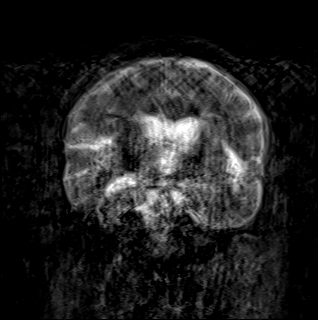
[im 30/30]
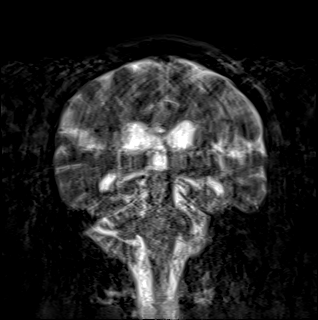

[Series 13: T2 fat-sat · coronal · 3.0mm · 0.59mm/px · 7 of 30 slices shown (6 of 6)]
[im 1/30]
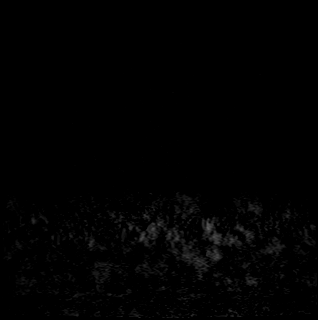
[im 5/30]
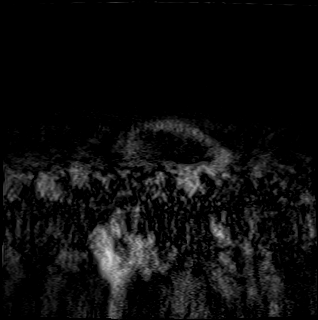
[im 10/30]
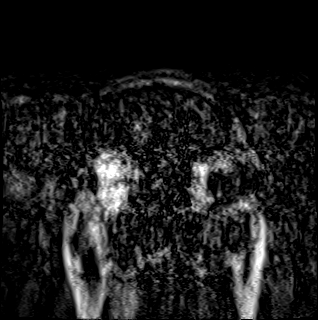
[im 15/30]
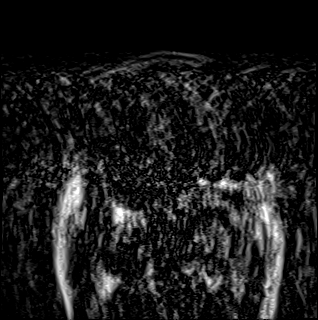
[im 20/30]
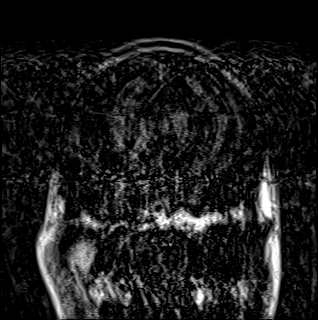
[im 25/30]
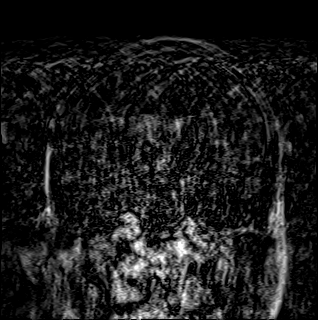
[im 30/30]
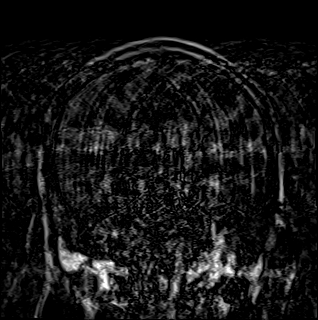

[42 of 48 positions shown; findings below may reference images not displayed]

FINDINGS: MRI HEAD FINDINGS

Brain: Examination moderately to severely degraded by motion
artifact.

Mild diffuse prominence of the CSF containing spaces compatible
generalized cerebral atrophy. Patchy and confluent T2/FLAIR
hyperintensity involving the periventricular deep white matter both
cerebral hemispheres as well as the pons, consistent with chronic
microvascular ischemic disease, moderate in nature. Few scatter
remote lacunar infarcts present about the bilateral basal ganglia,
left thalamus, and left pons.

No abnormal foci of restricted diffusion to suggest acute or
subacute ischemia. Gray-white matter differentiation maintained. No
encephalomalacia to suggest chronic cortical infarction. No definite
foci of susceptibility artifact to suggest acute or chronic
intracranial hemorrhage on this motion degraded exam.

No visible mass lesion, mass effect or midline shift. No
hydrocephalus or extra-axial fluid collection. Pituitary gland
suprasellar region normal. Midline structures intact.

Vascular: Major intracranial vascular flow voids are maintained at
the skull base. Left vertebral artery hypoplastic.

Skull and upper cervical spine: Craniocervical junction within
normal limits. Postsurgical changes partially visualize within the
upper cervical spine.

Other: No mastoid effusion.  Inner ear structures grossly normal.

MRI ORBITS FINDINGS

Orbits: Examination severely degraded by motion artifact, markedly
limiting assessment.

Globes are grossly symmetric with normal appearance bilaterally. No
obvious abnormality on this motion degraded exam. Optic nerves
grossly symmetric without visible abnormality. No visible
abnormality about the orbital apices or cavernous sinus. Optic
chiasm normally situated within the suprasellar cistern on
corresponding brain MRI. Visualized optic radiations grossly normal.
No visible abnormality about the cavernous sinus. Superior orbital
veins grossly symmetric and within normal limits. Intraconal and
extraconal fat maintained. Extra-ocular muscles symmetric and within
normal limits. Lacrimal glands within normal limits. No acute
abnormality.

Visualized sinuses: Mild scattered mucosal thickening noted within
the sphenoid ethmoidal sinuses. Partially visualized paranasal
sinuses are otherwise grossly clear.

Soft tissues: Grossly unremarkable.
IMPRESSION: 1. Technically limited exam due to extensive motion artifact.
2. No acute intracranial abnormality. No obvious abnormality about
either orbit.
3. Generalized cerebral atrophy with moderate chronic microvascular
ischemic disease, with multiple scattered remote lacunar infarcts
about the bilateral basal ganglia, left thalamus, and pons.

## 2022-08-29 NOTE — Progress Notes (Signed)
PROGRESS NOTE    [name]  ZOX:[id number] DOB: 18-Dec-1960 DOA: 08/08/2022 PCP: [name], MD  62/M w hypertension, T2DM, CKD stage IV, peripheral vascular disease, history of CVA and chronic heart failure, who presented from SNF for evaluation of low hgb. Recent hospitalization 05/22 to 05/25 for heart failure exacerbation, and anemia with positive stool occult blood test. Required 3 units PRBC transfusion. No invasive procedure required. Follow up hgb was low and patient was referred to the ED for further evaluation. positive left stump pitting edema.  -Labs w BUN 80 cr 5,26, BNP 498, Hb 6.9 w/ iron defi , Wbc 1,9, hgb 6,9 plt 57  -Transfused one unit PRBC with follow up hgb at 7,6  -Positive urinary retention, required in and out catheterization.  -6/15 continue to have hypervolemia. Further drop in Hgb down to 6,8 and had PRBC transfusion.  -6/16 patient with urinary retention but refused Foley catheter.  -Had [facility] placed and HD started on 6/21.  Nephrology and palliative medicine following.  Oncology consulted and ordered bone marrow biopsy.  Unfortunately, bone marrow biopsy aborted on 6/25 due to orthopnea, desaturation, nausea and emesis.  IR recommends outpatient follow-up.   -Tolerated HD in recliner 6/26. Still needs a lot of assistance to get out of bed (hoyer lift, 2+ assist).  Possible permanent access next week, if cell count more stable.  -6/29 patient with improvement in nausea and back pain. Continue very weak and deconditioned. Poor oral intake.  -6/30 mentation and gastrointestinal symptoms improving, plan for vascular surgery consult for AV fistula creation.  -7/01 tolerating well HD.  -7/02 vascular surgery recommended fistula creation as outpatient, once comorbid conditions are more stable.  -7/3, IR reconsulted for bone marrow biopsy,  Subjective: -Feels so-so, no complaints this morning  Assessment and Plan:  Pancytopenia (HCC) Symptomatic anemia.   Recent GI evaluation, recommended conservative management.  -Sp PRBC transfusion for a total of 3 since admission.  -Seen by Heme, BM biopsy recommended -IR consulted, 6/25 unable to perform bone marrow biopsy on account of medical stability -WBC down to 1.3 yesterday, hemoglobin 7.3 -IR reconsulted yesterday, considering medical stability to evaluate for bone marrow biopsy, plan for Friday  Acute renal failure superimposed on stage 4 chronic kidney disease (HCC) New ESRD Hypokalemia. Hyponatremia.  -multifactorial.  -TDC placed on 06/21.  -Continue HD per renal Vascular surgery has recommended fistula creation as outpatient, after medical comorbid conditions more stable.   Acute on chronic heart failure with preserved ejection fraction (HFpEF) (HCC) Echocardiogram with preserved LV systolic function. EF 50 to 55%. Moderate LVH, RV systolic function preserved. Mild aortic valve stenosis.  -continue amlodipine, carvedilol and hydralazine.  -Volume managed with HD  Essential hypertension Continue carvedilol, amlodipine and hydralazine.   History of CVA (cerebrovascular accident) Continue supportive medical therapy. PT and OT. Patient will return to SNF when medically stable.   Type 2 diabetes mellitus with hyperlipidemia (HCC) Continue insulin sliding scale for glucose cover and monitoring. Basal insulin on hold  PAD (peripheral artery disease) (HCC) Left BKA.   Depression Continue with PRN alprazolam.  Patient requested to discontinue sertraline due to hallucinations.   Patient with persistent pain, multifactorial.  Continue with oral oxycodone and hydromorphone.   Protein-calorie malnutrition, moderate (HCC) Continue with nutritional supplementation.   DVT prophylaxis: SCDs Code Status: DNR Family Communication: No family at bedside Disposition Plan: Back to SNF likely Saturday   Pressure injury of buttock, stage 2 (HCC)  Active Pressure Injury/Wound(s)  Pressure Ulcer  Duration          Pressure Injury 08/22/22 Sacrum Stage 2 -  Partial thickness loss of dermis presenting as a shallow open injury with a red, pink wound bed without slough. skin tear 3 days            Consultants:    Procedures:   Antimicrobials:    Objective: Vitals:   08/28/22 2054 08/29/22 0006 08/29/22 0358 08/29/22 0813  BP: 129/65  133/67 139/75  Pulse: 72  69 71  Resp:   18 18  Temp: 98.1 F (36.7 C) 97.7 F (36.5 C) 98.6 F (37 C) 98.4 F (36.9 C)  TempSrc: Oral Oral Oral Oral  SpO2: 98%  99% 100%  Weight:   109.1 kg   Height:        Intake/Output Summary (Last 24 hours) at 08/29/2022 1135 Last data filed at 08/29/2022 1610 Gross per 24 hour  Intake 240 ml  Output 3600 ml  Net -3360 ml   Filed Weights   08/28/22 0338 08/28/22 0834 08/29/22 0358  Weight: 104.1 kg 109.6 kg 109.1 kg    Examination:  Chronically ill male sitting up in bed, AAO x 3 HEENT: No JVD CVS: S1-S2, regular rhythm, systolic murmur Lungs: Decreased breath sounds at the bases Abdomen: Soft, nontender, bowel sounds present Extremities: Trace edema, left BKA  Data Reviewed:   CBC: Recent Labs  Lab 08/23/22 0843 08/24/22 0703 08/25/22 0705 08/26/22 0056 08/28/22 0024  WBC 1.3* 1.3* 1.3*  --  1.3*  HGB 7.7* 7.5* 7.2* 7.0* 7.3*  HCT 24.1* 23.9* 23.2* 22.6* 23.4*  MCV 91.3 90.5 91.3  --  91.8  PLT 105* 100* 94*  --  112*   Basic Metabolic Panel: Recent Labs  Lab 08/23/22 0108 08/23/22 0843 08/24/22 0047 08/25/22 0111 08/26/22 0056 08/28/22 0024  NA 137 138 135 136 133* 133*  K 3.0* 3.0* 3.2* 3.3* 3.4* 3.6  CL 100 100 99 99 97* 98  CO2 26 26 26 27 26 27   GLUCOSE 121* 132* 152* 136* 142* 123*  BUN 33* 33* 22 31* 39* 29*  CREATININE 3.17* 3.27* 2.43* 3.08* 3.68* 3.21*  CALCIUM 8.2* 8.4* 8.2* 8.2* 7.9* 8.0*  PHOS 4.0 4.0 3.1 3.9 3.8  --    GFR: Estimated Creatinine Clearance: 29 mL/min (A) (by C-G formula based on SCr of 3.21 mg/dL (H)). Liver  Function Tests: Recent Labs  Lab 08/23/22 0108 08/23/22 0843 08/24/22 0047 08/25/22 0111 08/26/22 0056  ALBUMIN 2.6* 2.6* 2.6* 2.6* 2.5*   No results for input(s): "LIPASE", "AMYLASE" in the last 168 hours. No results for input(s): "AMMONIA" in the last 168 hours. Coagulation Profile: No results for input(s): "INR", "PROTIME" in the last 168 hours. Cardiac Enzymes: No results for input(s): "CKTOTAL", "CKMB", "CKMBINDEX", "TROPONINI" in the last 168 hours. BNP (last 3 results) No results for input(s): "PROBNP" in the last 8760 hours. HbA1C: No results for input(s): "HGBA1C" in the last 72 hours. CBG: Recent Labs  Lab 08/28/22 1417 08/28/22 1717 08/28/22 2206 08/29/22 0628 08/29/22 1104  GLUCAP 101* 146* 162* 110* 138*   Lipid Profile: No results for input(s): "CHOL", "HDL", "LDLCALC", "TRIG", "CHOLHDL", "LDLDIRECT" in the last 72 hours. Thyroid Function Tests: No results for input(s): "TSH", "T4TOTAL", "FREET4", "T3FREE", "THYROIDAB" in the last 72 hours. Anemia Panel: No results for input(s): "VITAMINB12", "FOLATE", "FERRITIN", "TIBC", "IRON", "RETICCTPCT" in the last 72 hours. Urine analysis:    Component Value Date/Time   COLORURINE YELLOW 08/09/2022 0530  APPEARANCEUR CLEAR 08/09/2022 0530   APPEARANCEUR Clear 12/31/2013 2116   LABSPEC 1.025 08/09/2022 0530   LABSPEC 1.032 12/31/2013 2116   PHURINE 5.5 08/09/2022 0530   GLUCOSEU NEGATIVE 08/09/2022 0530   GLUCOSEU >=500 12/31/2013 2116   HGBUR NEGATIVE 08/09/2022 0530   BILIRUBINUR NEGATIVE 08/09/2022 0530   BILIRUBINUR Negative 12/31/2013 2116   KETONESUR NEGATIVE 08/09/2022 0530   PROTEINUR 100 (A) 08/09/2022 0530   UROBILINOGEN 1.0 08/28/2013 1709   NITRITE NEGATIVE 08/09/2022 0530   LEUKOCYTESUR NEGATIVE 08/09/2022 0530   LEUKOCYTESUR Negative 12/31/2013 2116   Sepsis Labs: @LABRCNTIP (procalcitonin:4,lacticidven:4)  )No results found for this or any previous visit (from the past 240 hour(s)).    Radiology Studies: VAS [place] UPPER EXT VEIN MAPPING (PRE-OP AVF)  Result Date: 08/27/2022 UPPER EXTREMITY VEIN MAPPING Patient Name:  [name]  Date of Exam:   08/27/2022 Medical Rec #: [id number]      Accession #:    [id number] Date of Birth: 07/21/1960       Patient Gender: M Patient Age:   14 years Exam Location:  [facility] Procedure:      VAS [place] UPPER EXT VEIN MAPPING (PRE-OP AVF) Referring Phys: EMMA COLLINS --------------------------------------------------------------------------------  Indications: Pre-access. Limitations: Patient positioning and inability to hold position for exam Comparison Study: No previous study. Performing Technologist: McKayla Maag RVT, [place]  Examination Guidelines: A complete evaluation includes B-mode imaging, spectral Doppler, color Doppler, and power Doppler as needed of all accessible portions of each vessel. Bilateral testing is considered an integral part of a complete examination. Limited examinations for reoccurring indications may be performed as noted. +-----------------+-------------+----------+---------+ Right Cephalic   Diameter (cm)Depth (cm)Findings  +-----------------+-------------+----------+---------+ Shoulder             0.53        0.45             +-----------------+-------------+----------+---------+ Prox upper arm       0.48        0.39             +-----------------+-------------+----------+---------+ Mid upper arm        0.45        0.33             +-----------------+-------------+----------+---------+ Dist upper arm       0.50        0.37             +-----------------+-------------+----------+---------+ Antecubital fossa    0.47        0.35   branching +-----------------+-------------+----------+---------+ Prox forearm         0.41        0.49             +-----------------+-------------+----------+---------+ Mid forearm          0.37        0.43              +-----------------+-------------+----------+---------+ Dist forearm         0.33        0.32             +-----------------+-------------+----------+---------+ +-----------------+-------------+----------+---------+ Right Basilic    Diameter (cm)Depth (cm)Findings  +-----------------+-------------+----------+---------+ Mid upper arm        0.66        0.97             +-----------------+-------------+----------+---------+ Dist upper arm       0.37        0.59   branching +-----------------+-------------+----------+---------+ Antecubital fossa  0.24        0.55   branching +-----------------+-------------+----------+---------+ Prox forearm         0.27        0.38   branching +-----------------+-------------+----------+---------+ Mid forearm          0.30        0.22             +-----------------+-------------+----------+---------+ Distal forearm       0.20        0.30             +-----------------+-------------+----------+---------+ +-----------------+-------------+----------+---------+ Left Cephalic    Diameter (cm)Depth (cm)Findings  +-----------------+-------------+----------+---------+ Shoulder             0.45        0.51             +-----------------+-------------+----------+---------+ Prox upper arm       0.53        0.30             +-----------------+-------------+----------+---------+ Mid upper arm                              IV     +-----------------+-------------+----------+---------+ Dist upper arm       0.35        0.31             +-----------------+-------------+----------+---------+ Antecubital fossa    0.63        0.23   branching +-----------------+-------------+----------+---------+ Prox forearm         0.23        0.40   branching +-----------------+-------------+----------+---------+ Mid forearm          0.26        0.21             +-----------------+-------------+----------+---------+ Dist forearm          0.22        0.24             +-----------------+-------------+----------+---------+ +-----------------+-------------+----------+--------------+ Left Basilic     Diameter (cm)Depth (cm)   Findings    +-----------------+-------------+----------+--------------+ Mid upper arm        0.73        0.67                  +-----------------+-------------+----------+--------------+ Dist upper arm       0.78        0.40                  +-----------------+-------------+----------+--------------+ Antecubital fossa    0.34        0.21     branching    +-----------------+-------------+----------+--------------+ Prox forearm         0.21        0.25                  +-----------------+-------------+----------+--------------+ Mid forearm                             not visualized +-----------------+-------------+----------+--------------+ Distal forearm                          not visualized +-----------------+-------------+----------+--------------+ *See table(s) above for measurements and observations.  Diagnosing physician: Waverly Ferrari MD Electronically signed by Waverly Ferrari MD on 08/27/2022 at 6:05:30 PM.    Final      Scheduled Meds:  (  feeding supplement) PROSource Plus  30 mL Oral TID BM   amLODipine  10 mg Oral Daily   atorvastatin  80 mg Oral QHS   carvedilol  25 mg Oral BID WC   Chlorhexidine Gluconate Cloth  6 each Topical Q0600   darbepoetin (ARANESP) injection - NON-DIALYSIS  200 mcg Subcutaneous Q Wed-1800   feeding supplement  1 Container Oral TID BM   hydrALAZINE  50 mg Oral 4 times per day   insulin aspart  0-5 Units Subcutaneous QHS   insulin aspart  0-6 Units Subcutaneous TID WC   multivitamin  1 tablet Oral QHS   mouth rinse  15 mL Mouth Rinse 4 times per day   pantoprazole  40 mg Oral Daily   sodium chloride flush  3 mL Intravenous Q12H   tamsulosin  0.4 mg Oral QPC supper   Continuous Infusions:  anticoagulant sodium citrate       LOS:  21 days    Time spent:    Zannie Cove, MD [facility]   08/29/2022, 11:35 AM

## 2022-08-29 NOTE — Progress Notes (Addendum)
Patient ID: [name], male   DOB: September 23, 1960, 62 y.o.   MRN: [id number] S: No events overnight.  O:BP 139/75 (BP Location: Left Wrist)   Pulse 71   Temp 98.4 F (36.9 C) (Oral)   Resp 18   Ht 5\' 8"  (1.727 m)   Wt 109.1 kg   SpO2 100%   BMI 36.57 kg/m   Intake/Output Summary (Last 24 hours) at 08/29/2022 0859 Last data filed at 08/29/2022 6962 Gross per 24 hour  Intake 240 ml  Output 3600 ml  Net -3360 ml   Intake/Output: I/O last 3 completed shifts: In: 120 [P.O.:120] Out: 3800 [Urine:300; Other:3500]  Intake/Output this shift:  Total I/O In: 120 [P.O.:120] Out: -  Weight change: 5.5 kg Gen: lethargic but NAD CVS: RRR Resp: CTA Abd: +BS, soft, NT/ND Ext: trace pretibial edema on RLE, s/p LBKA  Recent Labs  Lab 08/23/22 0108 08/23/22 0843 08/24/22 0047 08/25/22 0111 08/26/22 0056 08/28/22 0024  NA 137 138 135 136 133* 133*  K 3.0* 3.0* 3.2* 3.3* 3.4* 3.6  CL 100 100 99 99 97* 98  CO2 26 26 26 27 26 27   GLUCOSE 121* 132* 152* 136* 142* 123*  BUN 33* 33* 22 31* 39* 29*  CREATININE 3.17* 3.27* 2.43* 3.08* 3.68* 3.21*  ALBUMIN 2.6* 2.6* 2.6* 2.6* 2.5*  --   CALCIUM 8.2* 8.4* 8.2* 8.2* 7.9* 8.0*  PHOS 4.0 4.0 3.1 3.9 3.8  --    Liver Function Tests: Recent Labs  Lab 08/24/22 0047 08/25/22 0111 08/26/22 0056  ALBUMIN 2.6* 2.6* 2.5*   No results for input(s): "LIPASE", "AMYLASE" in the last 168 hours. No results for input(s): "AMMONIA" in the last 168 hours. CBC: Recent Labs  Lab 08/23/22 0843 08/24/22 0703 08/25/22 0705 08/26/22 0056 08/28/22 0024  WBC 1.3* 1.3* 1.3*  --  1.3*  HGB 7.7* 7.5* 7.2* 7.0* 7.3*  HCT 24.1* 23.9* 23.2* 22.6* 23.4*  MCV 91.3 90.5 91.3  --  91.8  PLT 105* 100* 94*  --  112*   Cardiac Enzymes: No results for input(s): "CKTOTAL", "CKMB", "CKMBINDEX", "TROPONINI" in the last 168 hours. CBG: Recent Labs  Lab 08/28/22 0614 08/28/22 1417 08/28/22 1717 08/28/22 2206 08/29/22 0628  GLUCAP 134* 101* 146* 162* 110*     Iron Studies: No results for input(s): "IRON", "TIBC", "TRANSFERRIN", "FERRITIN" in the last 72 hours. Studies/Results: VAS [place] UPPER EXT VEIN MAPPING (PRE-OP AVF)  Result Date: 08/27/2022 UPPER EXTREMITY VEIN MAPPING Patient Name:  [name]  Date of Exam:   08/27/2022 Medical Rec #: [id number]      Accession #:    [id number] Date of Birth: Apr 21, 1960       Patient Gender: M Patient Age:   77 years Exam Location:  [facility] Procedure:      VAS [place] UPPER EXT VEIN MAPPING (PRE-OP AVF) Referring Phys: EMMA COLLINS --------------------------------------------------------------------------------  Indications: Pre-access. Limitations: Patient positioning and inability to hold position for exam Comparison Study: No previous study. Performing Technologist: McKayla Maag RVT, [place]  Examination Guidelines: A complete evaluation includes B-mode imaging, spectral Doppler, color Doppler, and power Doppler as needed of all accessible portions of each vessel. Bilateral testing is considered an integral part of a complete examination. Limited examinations for reoccurring indications may be performed as noted. +-----------------+-------------+----------+---------+ Right Cephalic   Diameter (cm)Depth (cm)Findings  +-----------------+-------------+----------+---------+ Shoulder             0.53        0.45             +-----------------+-------------+----------+---------+  Prox upper arm       0.48        0.39             +-----------------+-------------+----------+---------+ Mid upper arm        0.45        0.33             +-----------------+-------------+----------+---------+ Dist upper arm       0.50        0.37             +-----------------+-------------+----------+---------+ Antecubital fossa    0.47        0.35   branching +-----------------+-------------+----------+---------+ Prox forearm         0.41        0.49             +-----------------+-------------+----------+---------+  Mid forearm          0.37        0.43             +-----------------+-------------+----------+---------+ Dist forearm         0.33        0.32             +-----------------+-------------+----------+---------+ +-----------------+-------------+----------+---------+ Right Basilic    Diameter (cm)Depth (cm)Findings  +-----------------+-------------+----------+---------+ Mid upper arm        0.66        0.97             +-----------------+-------------+----------+---------+ Dist upper arm       0.37        0.59   branching +-----------------+-------------+----------+---------+ Antecubital fossa    0.24        0.55   branching +-----------------+-------------+----------+---------+ Prox forearm         0.27        0.38   branching +-----------------+-------------+----------+---------+ Mid forearm          0.30        0.22             +-----------------+-------------+----------+---------+ Distal forearm       0.20        0.30             +-----------------+-------------+----------+---------+ +-----------------+-------------+----------+---------+ Left Cephalic    Diameter (cm)Depth (cm)Findings  +-----------------+-------------+----------+---------+ Shoulder             0.45        0.51             +-----------------+-------------+----------+---------+ Prox upper arm       0.53        0.30             +-----------------+-------------+----------+---------+ Mid upper arm                              IV     +-----------------+-------------+----------+---------+ Dist upper arm       0.35        0.31             +-----------------+-------------+----------+---------+ Antecubital fossa    0.63        0.23   branching +-----------------+-------------+----------+---------+ Prox forearm         0.23        0.40   branching +-----------------+-------------+----------+---------+ Mid forearm          0.26        0.21              +-----------------+-------------+----------+---------+  Dist forearm         0.22        0.24             +-----------------+-------------+----------+---------+ +-----------------+-------------+----------+--------------+ Left Basilic     Diameter (cm)Depth (cm)   Findings    +-----------------+-------------+----------+--------------+ Mid upper arm        0.73        0.67                  +-----------------+-------------+----------+--------------+ Dist upper arm       0.78        0.40                  +-----------------+-------------+----------+--------------+ Antecubital fossa    0.34        0.21     branching    +-----------------+-------------+----------+--------------+ Prox forearm         0.21        0.25                  +-----------------+-------------+----------+--------------+ Mid forearm                             not visualized +-----------------+-------------+----------+--------------+ Distal forearm                          not visualized +-----------------+-------------+----------+--------------+ *See table(s) above for measurements and observations.  Diagnosing physician: Waverly Ferrari MD Electronically signed by Waverly Ferrari MD on 08/27/2022 at 6:05:30 PM.    Final     (feeding supplement) PROSource Plus  30 mL Oral TID BM   amLODipine  10 mg Oral Daily   atorvastatin  80 mg Oral QHS   carvedilol  25 mg Oral BID WC   Chlorhexidine Gluconate Cloth  6 each Topical Q0600   darbepoetin (ARANESP) injection - NON-DIALYSIS  200 mcg Subcutaneous Q Wed-1800   feeding supplement  1 Container Oral TID BM   hydrALAZINE  50 mg Oral 4 times per day   insulin aspart  0-5 Units Subcutaneous QHS   insulin aspart  0-6 Units Subcutaneous TID WC   multivitamin  1 tablet Oral QHS   mouth rinse  15 mL Mouth Rinse 4 times per day   pantoprazole  40 mg Oral Daily   sodium chloride flush  3 mL Intravenous Q12H   tamsulosin  0.4 mg Oral QPC supper    BMET     Component Value Date/Time   NA 133 (L) 08/28/2022 0024   NA 135 (L) 12/31/2013 1336   K 3.6 08/28/2022 0024   K 3.8 12/31/2013 1336   CL 98 08/28/2022 0024   CL 101 12/31/2013 1336   CO2 27 08/28/2022 0024   CO2 25 12/31/2013 1336   GLUCOSE 123 (H) 08/28/2022 0024   GLUCOSE 249 (H) 12/31/2013 1336   BUN 29 (H) 08/28/2022 0024   BUN 8 12/31/2013 1336   CREATININE 3.21 (H) 08/28/2022 0024   CREATININE 0.88 12/31/2013 1336   CALCIUM 8.0 (L) 08/28/2022 0024   CALCIUM 8.6 12/31/2013 1336   GFRNONAA 21 (L) 08/28/2022 0024   GFRNONAA >60 12/31/2013 1336   GFRAA >60 01/29/2018 1705   GFRAA >60 12/31/2013 1336   CBC    Component Value Date/Time   WBC 1.3 (LL) 08/28/2022 0024   RBC 2.55 (L) 08/28/2022 0024   HGB 7.3 (L) 08/28/2022 0024   HGB 13.9 12/31/2013 1336  HCT 23.4 (L) 08/28/2022 0024   HCT 43.0 12/31/2013 1336   PLT 112 (L) 08/28/2022 0024   PLT 184 12/31/2013 1336   MCV 91.8 08/28/2022 0024   MCV 81 12/31/2013 1336   MCH 28.6 08/28/2022 0024   MCHC 31.2 08/28/2022 0024   RDW 16.0 (H) 08/28/2022 0024   RDW 14.4 12/31/2013 1336   LYMPHSABS 0.5 (L) 08/19/2022 0733   MONOABS 0.1 08/19/2022 0733   EOSABS 0.1 08/19/2022 0733   BASOSABS 0.0 08/19/2022 0733    Assessment/Plan: 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  Patient has had multiple consultation with palliative care and discussed goals of care.  It seems like it was decided to pursue dialysis as per patient interest and monitor.  Based on the current functional status, deconditioning and challenges with mobility I am worried that he may not be a good candidate for outpatient dialysis.  -s/p RIJ TDC placed by IR followed by dialysis on 6/21. Will plan for HD on MWF schedule -CLIP: [facility] MWF 12:20pm chair time.  Tolerated HD in recliner 6/26. Still needs a lot of assistance to get out of bed (hoyer lift, 2+ assist). Will c/w HD in recliner which he fortunately has been  tolerating -can plan for permanent access as an outpatient.  Seen by Dr. Karin Lieu who feels that he is too unstable for surgery at this time and will arrange for follow up in a month.     Acute urinary retention: On Flomax and Foley catheter.   Pancytopenia/anemia: hemonc consulted for further evaluation.  Iron saturation 32% with high ferritin.  On ESA. Did not tolerate BM biopsy 6/25. IR reconsulted for another attempt for bone marrow biopsy.  Transfuse PRN for hgb <7.  Increased aranesp dose.   Acute on chronic diastolic CHF: Treated with IV diuretics initially.  Now managing volume with HD.   HTN/volume: Continue current antihypertensives, managing volume with HD.   Metabolic acidosis: Managed with dialysis, discontinued sodium bicarbonate., bicarb wnl   Hypokalemia: Dialyzing with high potassium bath.   Deconditioning - pt remains very weak and requires much assistance to get out of bed.  Will need PT/OT evaluation and ongoing SNF placement.  Irena Cords, MD [facility]

## 2022-08-30 ENCOUNTER — Inpatient Hospital Stay (HOSPITAL_COMMUNITY): Payer: Medicare (Managed Care)

## 2022-08-30 DIAGNOSIS — D61818 Other pancytopenia: Secondary | ICD-10-CM | POA: Diagnosis not present

## 2022-08-30 LAB — CBC WITH DIFFERENTIAL/PLATELET
Abs Immature Granulocytes: 0.17 10*3/uL — ABNORMAL HIGH (ref 0.00–0.07)
Basophils Absolute: 0 10*3/uL (ref 0.0–0.1)
Basophils Relative: 1 %
Eosinophils Absolute: 0 10*3/uL (ref 0.0–0.5)
Eosinophils Relative: 2 %
HCT: 23.4 % — ABNORMAL LOW (ref 39.0–52.0)
Hemoglobin: 7.4 g/dL — ABNORMAL LOW (ref 13.0–17.0)
Immature Granulocytes: 16 %
Lymphocytes Relative: 36 %
Lymphs Abs: 0.4 10*3/uL — ABNORMAL LOW (ref 0.7–4.0)
MCH: 28.9 pg (ref 26.0–34.0)
MCHC: 31.6 g/dL (ref 30.0–36.0)
MCV: 91.4 fL (ref 80.0–100.0)
Monocytes Absolute: 0.1 10*3/uL (ref 0.1–1.0)
Monocytes Relative: 6 %
Neutro Abs: 0.4 10*3/uL — CL (ref 1.7–7.7)
Neutrophils Relative %: 39 %
Platelets: 124 10*3/uL — ABNORMAL LOW (ref 150–400)
RBC: 2.56 MIL/uL — ABNORMAL LOW (ref 4.22–5.81)
RDW: 16.3 % — ABNORMAL HIGH (ref 11.5–15.5)
WBC: 1.1 10*3/uL — CL (ref 4.0–10.5)
nRBC: 0 % (ref 0.0–0.2)

## 2022-08-30 LAB — RENAL FUNCTION PANEL
Albumin: 2.4 g/dL — ABNORMAL LOW (ref 3.5–5.0)
Anion gap: 8 (ref 5–15)
BUN: 25 mg/dL — ABNORMAL HIGH (ref 8–23)
CO2: 28 mmol/L (ref 22–32)
Calcium: 8 mg/dL — ABNORMAL LOW (ref 8.9–10.3)
Chloride: 97 mmol/L — ABNORMAL LOW (ref 98–111)
Creatinine, Ser: 3.12 mg/dL — ABNORMAL HIGH (ref 0.61–1.24)
GFR, Estimated: 22 mL/min — ABNORMAL LOW (ref >60)
Glucose, Bld: 136 mg/dL — ABNORMAL HIGH (ref 70–99)
Phosphorus: 3.3 mg/dL (ref 2.5–4.6)
Potassium: 3.9 mmol/L (ref 3.5–5.1)
Sodium: 133 mmol/L — ABNORMAL LOW (ref 135–145)

## 2022-08-30 LAB — GLUCOSE, CAPILLARY
Glucose-Capillary: 110 mg/dL — ABNORMAL HIGH (ref 70–99)
Glucose-Capillary: 115 mg/dL — ABNORMAL HIGH (ref 70–99)
Glucose-Capillary: 100 mg/dL — ABNORMAL HIGH (ref 70–99)

## 2022-08-30 MED ORDER — FENTANYL CITRATE (PF) 100 MCG/2ML IJ SOLN
INTRAMUSCULAR | Status: AC | PRN
  Administered 2022-08-30: 50 ug via INTRAVENOUS

## 2022-08-30 MED ORDER — ONDANSETRON HCL 4 MG/2ML IJ SOLN
INTRAMUSCULAR | Status: AC
  Filled 2022-08-30: qty 2

## 2022-08-30 MED ORDER — ONDANSETRON HCL 4 MG/2ML IJ SOLN
INTRAMUSCULAR | Status: AC | PRN
  Administered 2022-08-30: 4 mg via INTRAVENOUS

## 2022-08-30 MED ORDER — POLYETHYLENE GLYCOL 3350 17 G PO PACK
17.0000 g | PACK | Freq: Two times a day (BID) | ORAL | Status: DC
  Administered 2022-08-30 – 2022-08-31 (×2): 17 g via ORAL
  Filled 2022-08-30 (×3): qty 1

## 2022-08-30 MED ORDER — MIDAZOLAM HCL 2 MG/2ML IJ SOLN
INTRAMUSCULAR | Status: AC | PRN
  Administered 2022-08-30: 1 mg via INTRAVENOUS

## 2022-08-30 MED ORDER — LIDOCAINE HCL (PF) 1 % IJ SOLN
20.0000 mL | Freq: Once | INTRAMUSCULAR | Status: AC
  Administered 2022-08-30: 20 mL

## 2022-08-30 MED ORDER — MIDAZOLAM HCL 2 MG/2ML IJ SOLN
INTRAMUSCULAR | Status: AC
  Filled 2022-08-30: qty 2

## 2022-08-30 MED ORDER — FENTANYL CITRATE (PF) 100 MCG/2ML IJ SOLN
INTRAMUSCULAR | Status: AC
  Filled 2022-08-30: qty 2

## 2022-08-30 MED ORDER — PENTAFLUOROPROP-TETRAFLUOROETH EX AERO
INHALATION_SPRAY | CUTANEOUS | Status: AC
  Filled 2022-08-30: qty 30

## 2022-08-30 NOTE — Progress Notes (Signed)
Nutrition Follow-up  DOCUMENTATION CODES:   Obesity unspecified, Non-severe (moderate) malnutrition in context of chronic illness  INTERVENTION:  Continue to recommend regular diet for widest variety of menu options to support  Snacks TID between meals Change Boost Breeze and ProSource to Ensure Enlive po BID, each supplement provides 350 kcal and 20 grams of protein. Renal MVI with minerals daily Recommend bowel regimen; last documented BM x1 week  NUTRITION DIAGNOSIS:   Moderate Malnutrition related to chronic illness (HF, PAD) as evidenced by mild fat depletion, mild muscle depletion, edema. - remains applicable  GOAL:   Patient will meet greater than or equal to 90% of their needs - goal unmet  MONITOR:   PO intake, Supplement acceptance, Labs, Weight trends, I & O's  REASON FOR ASSESSMENT:   Malnutrition Screening Tool    ASSESSMENT:   Pt admitted from SNF d/t low hemoglobin. PMH significant for HTN, insulin-dependent DM, CVA, PAD, chronic HFpEF. Recently admitted for acute on chronic HF, acute hypoxic respiratory failure and new pancytopenia with positive FOBT.  6/21 - s/p tunneled HD cath placement; HD initiated 7/5 - s/p bone marrow biopsy  Pt off unit x2 attempted visits. In IR and HD.  Reviewed MAR, pt not receiving all Boost Breeze/ProSource supplements. Likely d/t being NPO at times and unavailable d/t HD sessions. Given improvement in nausea and vomiting, will change order to Ensure for increased protein and caloric contents. RN reports pt enjoying ice pops. Pt also had enjoyed [place] sandwiches, graham crackers and peanut butter.   Documented meal completions are limited.  6/30: 25% dinner 7/4: 0% breakfast, 10% lunch, 50% dinner  Last HD session 7/3- net UF 3.5L  Post HD weight: 105.8 kg.  Nephrology planning for permanent access placement as OP.   Edema: mild generalized, non-pitting BUE, moderate RLE  Medications: SSI 0-5 units at bedtime, SSI 0-6  units TID, rena-vit, protonix  Labs: sodium 133, BUN 25, Cr 3.12, GFR 22, CBG's 110-155 x24 hours  Diet Order:   Diet Order             Diet regular Room service appropriate? Yes; Fluid consistency: Thin; Fluid restriction: 1200 mL Fluid  Diet effective now                   EDUCATION NEEDS:   Education needs have been addressed  Skin:  Skin Assessment: Skin Integrity Issues: Skin Integrity Issues:: Stage II Stage II: sacrum  Last BM:  6/28  Height:   Ht Readings from Last 1 Encounters:  08/09/22 5\' 8"  (1.727 m)    Weight:   Wt Readings from Last 1 Encounters:  08/30/22 98.5 kg    Ideal Body Weight:  70 kg  BMI:  Body mass index is 33.02 kg/m.  Estimated Nutritional Needs:   Kcal:  1800-2000  Protein:  95-110g  Fluid:  1L + UOP  Drusilla Kanner, RDN, LDN Clinical Nutrition

## 2022-08-30 NOTE — Progress Notes (Signed)
Contacted [facility] and spoke to Consulting civil engineer. Clinic advised pt will likely d/c to snf on Monday and pt will need to start on Wednesday. Will f/u with clinic on Monday to confirm d/c date. Will assist as needed.   Olivia Canter Renal Navigator [phone]

## 2022-08-30 NOTE — Procedures (Signed)
I was present at this dialysis session. I have reviewed the session itself and made appropriate changes.   Vital signs in last 24 hours:  Temp:  [97.4 F (36.3 C)-98.7 F (37.1 C)] 97.4 F (36.3 C) (07/05 0935) Pulse Rate:  [68-77] 73 (07/05 0935) Resp:  [13-20] 16 (07/05 0935) BP: (127-174)/(67-112) 153/78 (07/05 0935) SpO2:  [90 %-100 %] 96 % (07/05 0935) Weight:  [98.5 kg] 98.5 kg (07/05 0502) Weight change: -11.1 kg Filed Weights   08/28/22 0834 08/29/22 0358 08/30/22 0502  Weight: 109.6 kg 109.1 kg 98.5 kg    Recent Labs  Lab 08/30/22 0302  NA 133*  K 3.9  CL 97*  CO2 28  GLUCOSE 136*  BUN 25*  CREATININE 3.12*  CALCIUM 8.0*  PHOS 3.3    Recent Labs  Lab 08/25/22 0705 08/26/22 0056 08/28/22 0024 08/30/22 0044  WBC 1.3*  --  1.3* 1.1*  NEUTROABS  --   --   --  0.4*  HGB 7.2* 7.0* 7.3* 7.4*  HCT 23.2* 22.6* 23.4* 23.4*  MCV 91.3  --  91.8 91.4  PLT 94*  --  112* 124*    Scheduled Meds:  (feeding supplement) PROSource Plus  30 mL Oral TID BM   amLODipine  10 mg Oral Daily   atorvastatin  80 mg Oral QHS   carvedilol  25 mg Oral BID WC   Chlorhexidine Gluconate Cloth  6 each Topical Q0600   darbepoetin (ARANESP) injection - NON-DIALYSIS  200 mcg Subcutaneous Q Wed-1800   feeding supplement  1 Container Oral TID BM   hydrALAZINE  50 mg Oral 4 times per day   insulin aspart  0-5 Units Subcutaneous QHS   insulin aspart  0-6 Units Subcutaneous TID WC   multivitamin  1 tablet Oral QHS   mouth rinse  15 mL Mouth Rinse 4 times per day   pantoprazole  40 mg Oral Daily   sodium chloride flush  3 mL Intravenous Q12H   tamsulosin  0.4 mg Oral QPC supper   Continuous Infusions:  anticoagulant sodium citrate     PRN Meds:.acetaminophen **OR** acetaminophen, albuterol, ALPRAZolam, alteplase, anticoagulant sodium citrate, heparin, HYDROmorphone (DILAUDID) injection, loperamide, metoCLOPramide (REGLAN) injection, ondansetron (ZOFRAN) IV, mouth rinse, mouth rinse,  oxyCODONE, white petrolatum   Irena Cords,  MD 08/30/2022, 10:24 AM

## 2022-08-30 NOTE — Progress Notes (Signed)
This chaplain attempted F/U spiritual care visit. The Pt. is at HD at the time of the visit. HD shares Pt. is sleeping after a busy morning. This chaplain will attempt afternoon visit.  Chaplain Stephanie Acre [phone]

## 2022-08-30 NOTE — Progress Notes (Signed)
SNF reached out to CSW to inquire when pt will be ready to return. CSW messaged MD. MD states pt will likely medically ready to dc on Monday.   TOC will continue to follow for disposition needs .

## 2022-08-30 NOTE — Progress Notes (Signed)
PROGRESS NOTE    [name]  WUJ:[id number] DOB: 1960-10-31 DOA: 08/08/2022 PCP: [name], MD  62/M w hypertension, T2DM, CKD stage IV, peripheral vascular disease, history of CVA and chronic heart failure, who presented from SNF for evaluation of low hgb. Recent hospitalization 05/22 to 05/25 for heart failure exacerbation, and anemia with positive stool occult blood test. Required 3 units PRBC transfusion. No invasive procedure required. Follow up hgb was low and patient was referred to the ED for further evaluation. positive left stump pitting edema.  -Labs w BUN 80 cr 5,26, BNP 498, Hb 6.9 w/ iron defi , Wbc 1,9, hgb 6,9 plt 57  -Transfused one unit PRBC with follow up hgb at 7,6  -Positive urinary retention, required in and out catheterization.  -6/15 still volume overloaded,. Further drop in Hgb down to 6,8 and had PRBC transfusion.  -6/16 patient with urinary retention but refused Foley catheter.  -Had [facility] placed and HD started on 6/21.  Nephrology and palliative medicine following.  Oncology consulted and ordered bone marrow biopsy.  Unfortunately, bone marrow biopsy aborted on 6/25 due to orthopnea, desaturation, nausea and emesis.  IR recommends outpatient follow-up.   -Tolerated HD in recliner 6/26. Still needs a lot of assistance to get out of bed (hoyer lift, 2+ assist).  -6/29 patient with improvement in nausea and back pain. Continue very weak and deconditioned. Poor oral intake.  -6/30 mentation and gastrointestinal symptoms improving, plan for vascular surgery consult for AV fistula creation.  -7/01 tolerating well HD.  -7/02 vascular surgery recommended fistula creation as outpatient, once comorbid conditions are more stable.  -7/3, IR reconsulted for bone marrow biopsy, -7/5, bone marrow aspiration and biopsy completed  Subjective: -Just went down for bone marrow biopsy, no new complaints, feels fair  Assessment and Plan:  Pancytopenia (HCC) Symptomatic  anemia.  Recent GI evaluation, recommended conservative management.  -Sp PRBC transfusion for a total of 3 since admission.  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 Hyponatremia.  -multifactorial.  -TDC placed on 06/21.  -Continue HD per renal Vascular surgery has recommended fistula creation as outpatient, after medical comorbid conditions more stable.   Acute on chronic heart failure with preserved ejection fraction (HFpEF) (HCC) Echocardiogram with preserved LV systolic function. EF 50 to 55%. Moderate LVH, RV systolic function preserved. Mild aortic valve stenosis.  -continue amlodipine, carvedilol and hydralazine.  -Volume managed with HD  Essential hypertension Continue carvedilol, amlodipine and hydralazine.   History of CVA (cerebrovascular accident) Continue supportive medical therapy. PT and OT. Patient will return to SNF when medically stable.   Type 2 diabetes mellitus with hyperlipidemia (HCC) Continue insulin sliding scale for glucose cover and monitoring. Basal insulin on hold  PAD (peripheral artery disease) (HCC) Left BKA.   Depression Continue with PRN alprazolam.  Patient requested to discontinue sertraline due to hallucinations.   Patient with persistent pain, multifactorial.  Continue with oral oxycodone and hydromorphone.   Protein-calorie malnutrition, moderate (HCC) Continue with nutritional supplementation.   DVT prophylaxis: SCDs Code Status: DNR Family Communication: No family at bedside Disposition Plan: Back to SNF likely Saturday   Pressure injury of buttock, stage  2  (HCC)  Active Pressure Injury/Wound(s)     Pressure Ulcer  Duration          Pressure Injury 08/22/22 Sacrum Stage 2 -  Partial thickness loss of dermis presenting as a shallow open injury with a red, pink wound bed without slough. skin tear 3 days            Consultants:    Procedures:   Antimicrobials:    Objective: Vitals:   08/30/22 1130 08/30/22 1200 08/30/22 1223 08/30/22 1230  BP: (!) 146/86 (!) 165/86  (!) 150/86  Pulse: 70 72 72 72  Resp: 15 10 11 11   Temp:      TempSrc:      SpO2: 99% 98% 98% 98%  Weight:      Height:        Intake/Output Summary (Last 24 hours) at 08/30/2022 1240 Last data filed at 08/30/2022 0500 Gross per 24 hour  Intake 120 ml  Output 125 ml  Net -5 ml   Filed Weights   08/28/22 0834 08/29/22 0358 08/30/22 0502  Weight: 109.6 kg 109.1 kg 98.5 kg    Examination:  Chronically ill male sitting up in bed, AAOx3 HEENT: No JVD CVS: S1-S2, regular rhythm, systolic murmur Lungs: Decreased breath sounds to bases Abdomen: Soft, nontender, bowel sounds present Extremities: Trace edema, left BKA  Data Reviewed:   CBC: Recent Labs  Lab 08/24/22 0703 08/25/22 0705 08/26/22 0056 08/28/22 0024 08/30/22 0044  WBC 1.3* 1.3*  --  1.3* 1.1*  NEUTROABS  --   --   --   --  0.4*  HGB 7.5* 7.2* 7.0* 7.3* 7.4*  HCT 23.9* 23.2* 22.6* 23.4* 23.4*  MCV 90.5 91.3  --  91.8 91.4  PLT 100* 94*  --  112* 124*   Basic Metabolic Panel: Recent Labs  Lab 08/24/22 0047 08/25/22 0111 08/26/22 0056 08/28/22 0024 08/30/22 0302  NA 135 136 133* 133* 133*  K 3.2* 3.3* 3.4* 3.6 3.9  CL 99 99 97* 98 97*  CO2 26 27 26 27 28   GLUCOSE 152* 136* 142* 123* 136*  BUN 22 31* 39* 29* 25*  CREATININE 2.43* 3.08* 3.68* 3.21* 3.12*  CALCIUM 8.2* 8.2* 7.9* 8.0* 8.0*  PHOS 3.1 3.9 3.8  --  3.3   GFR: Estimated Creatinine Clearance: 28.3 mL/min (A) (by C-G formula based on SCr of 3.12 mg/dL (H)). Liver Function Tests: Recent Labs  Lab 08/24/22 0047  08/25/22 0111 08/26/22 0056 08/30/22 0302  ALBUMIN 2.6* 2.6* 2.5* 2.4*   No results for input(s): "LIPASE", "AMYLASE" in the last 168 hours. No results for input(s): "AMMONIA" in the last 168 hours. Coagulation Profile: No results for input(s): "INR", "PROTIME" in the last 168 hours. Cardiac Enzymes: No results for input(s): "CKTOTAL", "CKMB", "CKMBINDEX", "TROPONINI" in the last 168 hours. BNP (last 3 results) No results for input(s): "PROBNP" in the last 8760 hours. HbA1C: No results for input(s): "HGBA1C" in the last 72 hours. CBG: Recent Labs  Lab 08/29/22 0628 08/29/22 1104 08/29/22 1547 08/29/22 2103 08/30/22 0604  GLUCAP 110* 138* 133* 155* 110*   Lipid Profile: No results for input(s): "CHOL", "HDL", "LDLCALC", "TRIG", "CHOLHDL", "LDLDIRECT" in the last 72 hours. Thyroid Function Tests: No results for input(s): "TSH", "T4TOTAL", "FREET4", "T3FREE", "THYROIDAB" in the last 72 hours. Anemia Panel: No results for input(s): "VITAMINB12", "FOLATE", "FERRITIN", "TIBC", "IRON", "RETICCTPCT" in the last 72 hours. Urine analysis:    Component Value Date/Time   COLORURINE YELLOW 08/09/2022 0530  APPEARANCEUR CLEAR 08/09/2022 0530   APPEARANCEUR Clear 12/31/2013 2116   LABSPEC 1.025 08/09/2022 0530   LABSPEC 1.032 12/31/2013 2116   PHURINE 5.5 08/09/2022 0530   GLUCOSEU NEGATIVE 08/09/2022 0530   GLUCOSEU >=500 12/31/2013 2116   HGBUR NEGATIVE 08/09/2022 0530   BILIRUBINUR NEGATIVE 08/09/2022 0530   BILIRUBINUR Negative 12/31/2013 2116   KETONESUR NEGATIVE 08/09/2022 0530   PROTEINUR 100 (A) 08/09/2022 0530   UROBILINOGEN 1.0 08/28/2013 1709   NITRITE NEGATIVE 08/09/2022 0530   LEUKOCYTESUR NEGATIVE 08/09/2022 0530   LEUKOCYTESUR Negative 12/31/2013 2116   Sepsis Labs: @LABRCNTIP (procalcitonin:4,lacticidven:4)  )No results found for this or any previous visit (from the past 240 hour(s)).   Radiology Studies: No results found.   Scheduled Meds:  (feeding  supplement) PROSource Plus  30 mL Oral TID BM   amLODipine  10 mg Oral Daily   atorvastatin  80 mg Oral QHS   carvedilol  25 mg Oral BID WC   Chlorhexidine Gluconate Cloth  6 each Topical Q0600   darbepoetin (ARANESP) injection - NON-DIALYSIS  200 mcg Subcutaneous Q Wed-1800   feeding supplement  1 Container Oral TID BM   hydrALAZINE  50 mg Oral 4 times per day   insulin aspart  0-5 Units Subcutaneous QHS   insulin aspart  0-6 Units Subcutaneous TID WC   multivitamin  1 tablet Oral QHS   mouth rinse  15 mL Mouth Rinse 4 times per day   pantoprazole  40 mg Oral Daily   sodium chloride flush  3 mL Intravenous Q12H   tamsulosin  0.4 mg Oral QPC supper   Continuous Infusions:  anticoagulant sodium citrate       LOS: 22 days    Time spent:    Zannie Cove, MD [facility]   08/30/2022, 12:40 PM

## 2022-08-30 NOTE — Progress Notes (Signed)
   08/30/22 1421  Vitals  Temp 98 F (36.7 C)  Pulse Rate 74  Resp 13  BP (!) 174/83  SpO2 100 %  O2 Device Nasal Cannula  Type of Weight Post-Dialysis  Oxygen Therapy  O2 Flow Rate (L/min) 2 L/min  Patient Activity (if Appropriate) In bed  Pulse Oximetry Type Continuous  Oximetry Probe Site Changed No  Post Treatment  Dialyzer Clearance Lightly streaked  Duration of HD Treatment -hour(s) 3.5 hour(s)  Hemodialysis Intake (mL) 0 mL  Liters Processed 88.1  Fluid Removed (mL) 3000 mL  Tolerated HD Treatment Yes   Received patient in bed to unit.  Alert and oriented.  Informed consent signed and in chart.   TX duration:3.5  Patient tolerated well.  Transported back to the room  Alert, without acute distress.  Hand-off given to patient's nurse.   Access used: Dialysis catheter Access issues: no complications  Total UF removed: 3000 Medication(s) given: none [facility]

## 2022-08-30 NOTE — Procedures (Signed)
Vascular and Interventional Radiology Procedure Note  Patient: [name] DOB: 08/12/1960 Medical Record Number: [id number] Note Date/Time: 08/30/22 8:19 AM   Performing Physician: Roanna Banning, MD Assistant(s): None  Diagnosis: Pancytopenia  Procedure: BONE MARROW ASPIRATION and BIOPSY  Anesthesia: Conscious Sedation Complications: None Estimated Blood Loss: Minimal Specimens: Sent for Pathology  Findings:  Successful CT-guided bone marrow aspiration and biopsy A total of 1 cores were obtained. Hemostasis of the tract was achieved using Manual Pressure.  Plan: Bed rest for 1 hours.  See detailed procedure note with images in PACS. The patient tolerated the procedure well without incident or complication and was returned to Floor Bed in stable condition.    Roanna Banning, MD [facility]   Pager. [phone] Clinic. [phone]

## 2022-08-31 ENCOUNTER — Inpatient Hospital Stay (HOSPITAL_COMMUNITY): Payer: Medicare (Managed Care)

## 2022-08-31 DIAGNOSIS — D61818 Other pancytopenia: Secondary | ICD-10-CM | POA: Diagnosis not present

## 2022-08-31 LAB — GLUCOSE, CAPILLARY
Glucose-Capillary: 114 mg/dL — ABNORMAL HIGH (ref 70–99)
Glucose-Capillary: 108 mg/dL — ABNORMAL HIGH (ref 70–99)
Glucose-Capillary: 110 mg/dL — ABNORMAL HIGH (ref 70–99)
Glucose-Capillary: 107 mg/dL — ABNORMAL HIGH (ref 70–99)

## 2022-08-31 LAB — CBC
HCT: 23.8 % — ABNORMAL LOW (ref 39.0–52.0)
Hemoglobin: 7.5 g/dL — ABNORMAL LOW (ref 13.0–17.0)
MCH: 28.6 pg (ref 26.0–34.0)
MCHC: 31.5 g/dL (ref 30.0–36.0)
MCV: 90.8 fL (ref 80.0–100.0)
Platelets: 133 10*3/uL — ABNORMAL LOW (ref 150–400)
RBC: 2.62 MIL/uL — ABNORMAL LOW (ref 4.22–5.81)
RDW: 16.2 % — ABNORMAL HIGH (ref 11.5–15.5)
WBC: 1.2 10*3/uL — CL (ref 4.0–10.5)
nRBC: 0 % (ref 0.0–0.2)

## 2022-08-31 LAB — BASIC METABOLIC PANEL
Anion gap: 8 (ref 5–15)
BUN: 15 mg/dL (ref 8–23)
CO2: 29 mmol/L (ref 22–32)
Calcium: 8.3 mg/dL — ABNORMAL LOW (ref 8.9–10.3)
Chloride: 98 mmol/L (ref 98–111)
Creatinine, Ser: 2.35 mg/dL — ABNORMAL HIGH (ref 0.61–1.24)
GFR, Estimated: 31 mL/min — ABNORMAL LOW (ref >60)
Glucose, Bld: 126 mg/dL — ABNORMAL HIGH (ref 70–99)
Potassium: 3.9 mmol/L (ref 3.5–5.1)
Sodium: 135 mmol/L (ref 135–145)

## 2022-08-31 MED ORDER — SACCHAROMYCES BOULARDII 250 MG PO CAPS
250.0000 mg | ORAL_CAPSULE | Freq: Two times a day (BID) | ORAL | Status: DC
  Administered 2022-08-31 – 2022-09-04 (×6): 250 mg via ORAL
  Filled 2022-08-31 (×8): qty 1

## 2022-08-31 MED ORDER — DICYCLOMINE HCL 10 MG PO CAPS
10.0000 mg | ORAL_CAPSULE | Freq: Three times a day (TID) | ORAL | Status: AC
  Administered 2022-08-31 – 2022-09-01 (×3): 10 mg via ORAL
  Filled 2022-08-31 (×3): qty 1

## 2022-08-31 MED ORDER — ONDANSETRON HCL 4 MG/2ML IJ SOLN: 4.0000 mg | Freq: Four times a day (QID) | INTRAMUSCULAR | Status: DC | PRN

## 2022-08-31 MED ORDER — PANTOPRAZOLE SODIUM 40 MG PO TBEC
40.0000 mg | DELAYED_RELEASE_TABLET | Freq: Two times a day (BID) | ORAL | Status: DC
  Administered 2022-08-31 – 2022-09-04 (×7): 40 mg via ORAL
  Filled 2022-08-31 (×7): qty 1

## 2022-08-31 NOTE — Progress Notes (Addendum)
PROGRESS NOTE    [name]  ZOX:[id number] DOB: 1960/12/04 DOA: 08/08/2022 PCP: [name], MD  62/M w hypertension, T2DM, CKD stage IV, peripheral vascular disease, history of CVA and chronic heart failure, who presented from SNF for evaluation of low hgb. Recent hospitalization 05/22 to 05/25 for heart failure exacerbation, and anemia with positive stool occult blood test. Required 3 units PRBC transfusion. No invasive procedure required. Follow up hgb was low and patient was referred to the ED for further evaluation. positive left stump pitting edema.  -Labs w BUN 80 cr 5,26, BNP 498, Hb 6.9 w/ iron defi , Wbc 1,9, hgb 6,9 plt 57  -Transfused one unit PRBC with follow up hgb at 7,6  -Positive urinary retention, required in and out catheterization.  -6/15 still volume overloaded,. Further drop in Hgb down to 6,8 and had PRBC transfusion.  -6/16 patient with urinary retention but refused Foley catheter.  -Had [facility] placed and HD started on 6/21.  Nephrology and palliative medicine following.  Oncology consulted and ordered bone marrow biopsy.  Unfortunately, bone marrow biopsy aborted on 6/25 due to orthopnea, desaturation, nausea and emesis.  IR recommends outpatient follow-up.   -Tolerated HD in recliner 6/26. Still needs a lot of assistance to get out of bed (hoyer lift, 2+ assist).  -6/29 patient with improvement in nausea and back pain. Continue very weak and deconditioned. Poor oral intake.  -6/30 mentation and gastrointestinal symptoms improving, plan for vascular surgery consult for AV fistula creation.  -7/01 tolerating well HD.  -7/02 vascular surgery recommended fistula creation as outpatient, once comorbid conditions are more stable.  -7/3, IR reconsulted for bone marrow biopsy, -7/5, bone marrow aspiration and biopsy completed  Subjective: -Complains of nausea and abdominal discomfort today, cannot tell me when his last bowel movement was  Assessment and  Plan:  Pancytopenia (HCC) Symptomatic anemia.  Recent GI evaluation, recommended conservative management.  -Sp PRBC transfusion for a total of 3 since admission.  -Seen by Heme, BM biopsy recommended -IR consulted, 6/25 unable to perform bone marrow biopsy on account of medical stability -WBC down to 1.1, hemoglobin 7.3 -IR reconsulted, bone marrow biopsy completed 7/5 -Discussed with hematology, defer G-CSF at this time unless he becomes septic or unstable until MDS ruled out,  -Hemoglobin more stable today, if remains stable will plan discharge to SNF on Monday or  Acute renal failure superimposed on stage 4 chronic kidney disease (HCC) New ESRD Hypokalemia. Hyponatremia.  -multifactorial.  -TDC placed on 06/21.  -Continue HD per renal Vascular surgery has recommended fistula creation as outpatient, after medical comorbid conditions more stable.   Abdominal pain, nausea -Continue supportive care, check KUB, laxatives, add Bentyl, continue PPI -Also has some component of chronic pain as well  Acute on chronic heart failure with preserved ejection fraction (HFpEF) (HCC) Echocardiogram with preserved LV systolic function. EF 50 to 55%. Moderate LVH, RV systolic function preserved. Mild aortic valve stenosis.  -continue amlodipine, carvedilol and hydralazine.  -Volume managed with HD  Essential hypertension Continue carvedilol, amlodipine and hydralazine.   History of CVA (cerebrovascular accident) Continue supportive medical therapy. PT and OT. Patient will return to SNF when medically stable.   Type 2 diabetes mellitus with hyperlipidemia (HCC) Continue insulin sliding scale for glucose cover and monitoring. Basal insulin on hold  PAD (peripheral artery disease) (HCC) Left BKA.   Depression Continue with PRN alprazolam.  Patient requested to discontinue sertraline due to hallucinations.   Patient with persistent pain, multifactorial.  Continue with oral oxycodone and  hydromorphone.   Protein-calorie malnutrition, moderate (HCC) Continue with nutritional supplementation.   DVT prophylaxis: SCDs Code Status: DNR Family Communication: No family at bedside Disposition Plan: Back to SNF likely Monday   Pressure injury of buttock, stage 2 (HCC)  Active Pressure Injury/Wound(s)     Pressure Ulcer  Duration          Pressure Injury 08/22/22 Sacrum Stage 2 -  Partial thickness loss of dermis presenting as a shallow open injury with a red, pink wound bed without slough. skin tear 3 days            Consultants:    Procedures:   Antimicrobials:    Objective: Vitals:   08/31/22 0113 08/31/22 0433 08/31/22 0911 08/31/22 0959  BP: (!) 160/75 (!) 146/70 (!) 161/71 (!) 161/71  Pulse: 77 74  77  Resp: 20 16 18 17   Temp: 98.9 F (37.2 C) 99 F (37.2 C)  99.1 F (37.3 C)  TempSrc: Oral Oral  Oral  SpO2: 100% 100%    Weight:  97.7 kg    Height:        Intake/Output Summary (Last 24 hours) at 08/31/2022 1314 Last data filed at 08/31/2022 0100 Gross per 24 hour  Intake --  Output 3060 ml  Net -3060 ml   Filed Weights   08/29/22 0358 08/30/22 0502 08/31/22 0433  Weight: 109.1 kg 98.5 kg 97.7 kg    Examination:  Chronically ill male sitting up in bed, AAOx3 HEENT: No JVD CVS: S1-S2, regular rhythm, systolic murmur Lungs: Decreased breath sounds at the bases Abdomen: Soft, nontender, bowel sounds present Extremities: Trace edema, left BKA   Data Reviewed:   CBC: Recent Labs  Lab 08/25/22 0705 08/26/22 0056 08/28/22 0024 08/30/22 0044 08/31/22 0034  WBC 1.3*  --  1.3* 1.1* 1.2*  NEUTROABS  --   --   --  0.4*  --   HGB 7.2* 7.0* 7.3* 7.4* 7.5*  HCT 23.2* 22.6* 23.4* 23.4* 23.8*  MCV 91.3  --  91.8 91.4 90.8  PLT 94*  --  112* 124* 133*   Basic Metabolic Panel: Recent Labs  Lab 08/25/22 0111 08/26/22 0056 08/28/22 0024 08/30/22 0302 08/31/22 0034  NA 136 133* 133* 133* 135  K 3.3* 3.4* 3.6 3.9 3.9  CL 99 97* 98 97*  98  CO2 27 26 27 28 29   GLUCOSE 136* 142* 123* 136* 126*  BUN 31* 39* 29* 25* 15  CREATININE 3.08* 3.68* 3.21* 3.12* 2.35*  CALCIUM 8.2* 7.9* 8.0* 8.0* 8.3*  PHOS 3.9 3.8  --  3.3  --    GFR: Estimated Creatinine Clearance: 37.4 mL/min (A) (by C-G formula based on SCr of 2.35 mg/dL (H)). Liver Function Tests: Recent Labs  Lab 08/25/22 0111 08/26/22 0056 08/30/22 0302  ALBUMIN 2.6* 2.5* 2.4*   No results for input(s): "LIPASE", "AMYLASE" in the last 168 hours. No results for input(s): "AMMONIA" in the last 168 hours. Coagulation Profile: No results for input(s): "INR", "PROTIME" in the last 168 hours. Cardiac Enzymes: No results for input(s): "CKTOTAL", "CKMB", "CKMBINDEX", "TROPONINI" in the last 168 hours. BNP (last 3 results) No results for input(s): "PROBNP" in the last 8760 hours. HbA1C: No results for input(s): "HGBA1C" in the last 72 hours. CBG: Recent Labs  Lab 08/30/22 0604 08/30/22 1620 08/30/22 2121 08/31/22 0557 08/31/22 1119  GLUCAP 110* 115* 100* 114* 108*   Lipid Profile: No results for input(s): "CHOL", "HDL", "LDLCALC", "TRIG", "CHOLHDL", "LDLDIRECT"  in the last 72 hours. Thyroid Function Tests: No results for input(s): "TSH", "T4TOTAL", "FREET4", "T3FREE", "THYROIDAB" in the last 72 hours. Anemia Panel: No results for input(s): "VITAMINB12", "FOLATE", "FERRITIN", "TIBC", "IRON", "RETICCTPCT" in the last 72 hours. Urine analysis:    Component Value Date/Time   COLORURINE YELLOW 08/09/2022 0530   APPEARANCEUR CLEAR 08/09/2022 0530   APPEARANCEUR Clear 12/31/2013 2116   LABSPEC 1.025 08/09/2022 0530   LABSPEC 1.032 12/31/2013 2116   PHURINE 5.5 08/09/2022 0530   GLUCOSEU NEGATIVE 08/09/2022 0530   GLUCOSEU >=500 12/31/2013 2116   HGBUR NEGATIVE 08/09/2022 0530   BILIRUBINUR NEGATIVE 08/09/2022 0530   BILIRUBINUR Negative 12/31/2013 2116   KETONESUR NEGATIVE 08/09/2022 0530   PROTEINUR 100 (A) 08/09/2022 0530   UROBILINOGEN 1.0 08/28/2013 1709    NITRITE NEGATIVE 08/09/2022 0530   LEUKOCYTESUR NEGATIVE 08/09/2022 0530   LEUKOCYTESUR Negative 12/31/2013 2116   Sepsis Labs: @LABRCNTIP (procalcitonin:4,lacticidven:4)  )No results found for this or any previous visit (from the past 240 hour(s)).   Radiology Studies: DG Abd Portable 1V  Result Date: 08/31/2022 CLINICAL DATA:  Abdominal pain. EXAM: PORTABLE ABDOMEN - 1 VIEW COMPARISON:  12/10/2020. FINDINGS: No bowel dilation to suggest obstruction. Soft tissues are poorly defined. No convincing renal or ureteral stones. No acute skeletal abnormality. IMPRESSION: 1. No acute findings.  No evidence of bowel obstruction. Electronically Signed   By: Amie Portland M.D.   On: 08/31/2022 13:11   CT BONE MARROW BIOPSY & ASPIRATION  Result Date: 08/30/2022 INDICATION: Pancytopenia. EXAM: CT GUIDED BONE MARROW ASPIRATION AND CORE BIOPSY MEDICATIONS: 4 mg Zofran IV ANESTHESIA/SEDATION: Moderate (conscious) sedation was employed during this procedure. A total of Versed 1 mg and Fentanyl 50 mcg was administered intravenously. Moderate Sedation Time: 13 minutes. The patient's level of consciousness and vital signs were monitored continuously by radiology nursing throughout the procedure under my direct supervision. FLUOROSCOPY TIME:  CT dose in mGy was not provided. COMPLICATIONS: None immediate. Estimated blood loss: <5 mL PROCEDURE: RADIATION DOSE REDUCTION: This exam was performed according to the departmental dose-optimization program which includes automated exposure control, adjustment of the mA and/or kV according to patient size and/or use of iterative reconstruction technique. Informed written consent was obtained from the the patient and/or patient's representative after a thorough discussion of the procedural risks, benefits and alternatives. All questions were addressed. Maximal Sterile Barrier Technique was utilized including caps, mask, sterile gowns, sterile gloves, sterile drape, hand hygiene  and skin antiseptic. A timeout was performed prior to the initiation of the procedure. The patient was positioned prone and non-contrast localization CT was performed of the pelvis to demonstrate the iliac marrow spaces. Maximal barrier sterile technique utilized including caps, mask, sterile gowns, sterile gloves, large sterile drape, hand hygiene, and chlorhexidine prep. Under sterile conditions and local anesthesia, an 11 gauge coaxial bone biopsy needle was advanced into the RIGHT iliac marrow space. Needle position was confirmed with CT imaging. Initially, bone marrow aspiration was performed. Next, the 11 gauge outer cannula was utilized to obtain a 1 iliac bone marrow core biopsy. Needle was removed. Hemostasis was obtained with compression. The patient tolerated the procedure well. Samples were prepared with the cytotechnologist. IMPRESSION: Successful CT-guided bone marrow aspiration and biopsy. Roanna Banning, MD [facility] Radiology Electronically Signed   By: Roanna Banning M.D.   On: 08/30/2022 13:02     Scheduled Meds:  amLODipine  10 mg Oral Daily   atorvastatin  80 mg Oral QHS   carvedilol  25 mg Oral BID WC   Chlorhexidine Gluconate Cloth  6 each Topical Q0600   darbepoetin (ARANESP) injection - NON-DIALYSIS  200 mcg Subcutaneous Q Wed-1800   dicyclomine  10 mg Oral TID AC   hydrALAZINE  50 mg Oral 4 times per day   insulin aspart  0-5 Units Subcutaneous QHS   insulin aspart  0-6 Units Subcutaneous TID WC   multivitamin  1 tablet Oral QHS   mouth rinse  15 mL Mouth Rinse 4 times per day   pantoprazole  40 mg Oral Daily   polyethylene glycol  17 g Oral BID   saccharomyces boulardii  250 mg Oral BID   sodium chloride flush  3 mL Intravenous Q12H   tamsulosin  0.4 mg Oral QPC supper   Continuous Infusions:     LOS: 23 days    Time spent:    Zannie Cove, MD [facility]   08/31/2022, 1:14 PM

## 2022-08-31 NOTE — Progress Notes (Signed)
Education was provided regarding foley care and CHG bath. However, because of the abdominal pain and nausea, patient refused to take foley care and CHG bath even after we offered him several times throughout the day and 30 min before shift change.

## 2022-08-31 NOTE — Progress Notes (Signed)
Patient ID: [name], male   DOB: 09-17-1960, 62 y.o.   MRN: [id number] S: complaining of nausea and abdominal pain this morning.  O:BP (!) 161/71 (BP Location: Left Arm)   Pulse 77   Temp 99.1 F (37.3 C) (Oral)   Resp 17   Ht 5\' 8"  (1.727 m)   Wt 97.7 kg   SpO2 100%   BMI 32.75 kg/m   Intake/Output Summary (Last 24 hours) at 08/31/2022 1041 Last data filed at 08/31/2022 0100 Gross per 24 hour  Intake --  Output 3060 ml  Net -3060 ml   Intake/Output: I/O last 3 completed shifts: In: -  Out: 3185 [Urine:185; Other:3000]  Intake/Output this shift:  No intake/output data recorded. Weight change: -0.8 kg Gen: chronically ill-appearing CVS: RRR Resp:CTA Abd: +BS, soft, NT/ND Ext: 1+ brawny edema of RLE, s/p left BKA  Recent Labs  Lab 08/25/22 0111 08/26/22 0056 08/28/22 0024 08/30/22 0302 08/31/22 0034  NA 136 133* 133* 133* 135  K 3.3* 3.4* 3.6 3.9 3.9  CL 99 97* 98 97* 98  CO2 27 26 27 28 29   GLUCOSE 136* 142* 123* 136* 126*  BUN 31* 39* 29* 25* 15  CREATININE 3.08* 3.68* 3.21* 3.12* 2.35*  ALBUMIN 2.6* 2.5*  --  2.4*  --   CALCIUM 8.2* 7.9* 8.0* 8.0* 8.3*  PHOS 3.9 3.8  --  3.3  --    Liver Function Tests: Recent Labs  Lab 08/25/22 0111 08/26/22 0056 08/30/22 0302  ALBUMIN 2.6* 2.5* 2.4*   No results for input(s): "LIPASE", "AMYLASE" in the last 168 hours. No results for input(s): "AMMONIA" in the last 168 hours. CBC: Recent Labs  Lab 08/25/22 0705 08/26/22 0056 08/28/22 0024 08/30/22 0044 08/31/22 0034  WBC 1.3*  --  1.3* 1.1* 1.2*  NEUTROABS  --   --   --  0.4*  --   HGB 7.2*   < > 7.3* 7.4* 7.5*  HCT 23.2*   < > 23.4* 23.4* 23.8*  MCV 91.3  --  91.8 91.4 90.8  PLT 94*  --  112* 124* 133*   < > = values in this interval not displayed.   Cardiac Enzymes: No results for input(s): "CKTOTAL", "CKMB", "CKMBINDEX", "TROPONINI" in the last 168 hours. CBG: Recent Labs  Lab 08/29/22 2103 08/30/22 0604 08/30/22 1620 08/30/22 2121  08/31/22 0557  GLUCAP 155* 110* 115* 100* 114*    Iron Studies: No results for input(s): "IRON", "TIBC", "TRANSFERRIN", "FERRITIN" in the last 72 hours. Studies/Results: CT BONE MARROW BIOPSY & ASPIRATION  Result Date: 08/30/2022 INDICATION: Pancytopenia. EXAM: CT GUIDED BONE MARROW ASPIRATION AND CORE BIOPSY MEDICATIONS: 4 mg Zofran IV ANESTHESIA/SEDATION: Moderate (conscious) sedation was employed during this procedure. A total of Versed 1 mg and Fentanyl 50 mcg was administered intravenously. Moderate Sedation Time: 13 minutes. The patient's level of consciousness and vital signs were monitored continuously by radiology nursing throughout the procedure under my direct supervision. FLUOROSCOPY TIME:  CT dose in mGy was not provided. COMPLICATIONS: None immediate. Estimated blood loss: <5 mL PROCEDURE: RADIATION DOSE REDUCTION: This exam was performed according to the departmental dose-optimization program which includes automated exposure control, adjustment of the mA and/or kV according to patient size and/or use of iterative reconstruction technique. Informed written consent was obtained from the the patient and/or patient's representative after a thorough discussion of the procedural risks, benefits and alternatives. All questions were addressed. Maximal Sterile Barrier Technique was utilized including caps, mask, sterile gowns, sterile gloves, sterile drape,  hand hygiene and skin antiseptic. A timeout was performed prior to the initiation of the procedure. The patient was positioned prone and non-contrast localization CT was performed of the pelvis to demonstrate the iliac marrow spaces. Maximal barrier sterile technique utilized including caps, mask, sterile gowns, sterile gloves, large sterile drape, hand hygiene, and chlorhexidine prep. Under sterile conditions and local anesthesia, an 11 gauge coaxial bone biopsy needle was advanced into the RIGHT iliac marrow space. Needle position was confirmed  with CT imaging. Initially, bone marrow aspiration was performed. Next, the 11 gauge outer cannula was utilized to obtain a 1 iliac bone marrow core biopsy. Needle was removed. Hemostasis was obtained with compression. The patient tolerated the procedure well. Samples were prepared with the cytotechnologist. IMPRESSION: Successful CT-guided bone marrow aspiration and biopsy. Roanna Banning, MD [facility] Radiology Electronically Signed   By: Roanna Banning M.D.   On: 08/30/2022 13:02    amLODipine  10 mg Oral Daily   atorvastatin  80 mg Oral QHS   carvedilol  25 mg Oral BID WC   Chlorhexidine Gluconate Cloth  6 each Topical Q0600   darbepoetin (ARANESP) injection - NON-DIALYSIS  200 mcg Subcutaneous Q Wed-1800   dicyclomine  10 mg Oral TID AC   hydrALAZINE  50 mg Oral 4 times per day   insulin aspart  0-5 Units Subcutaneous QHS   insulin aspart  0-6 Units Subcutaneous TID WC   multivitamin  1 tablet Oral QHS   mouth rinse  15 mL Mouth Rinse 4 times per day   pantoprazole  40 mg Oral Daily   polyethylene glycol  17 g Oral BID   saccharomyces boulardii  250 mg Oral BID   sodium chloride flush  3 mL Intravenous Q12H   tamsulosin  0.4 mg Oral QPC supper    BMET    Component Value Date/Time   NA 135 08/31/2022 0034   NA 135 (L) 12/31/2013 1336   K 3.9 08/31/2022 0034   K 3.8 12/31/2013 1336   CL 98 08/31/2022 0034   CL 101 12/31/2013 1336   CO2 29 08/31/2022 0034   CO2 25 12/31/2013 1336   GLUCOSE 126 (H) 08/31/2022 0034   GLUCOSE 249 (H) 12/31/2013 1336   BUN 15 08/31/2022 0034   BUN 8 12/31/2013 1336   CREATININE 2.35 (H) 08/31/2022 0034   CREATININE 0.88 12/31/2013 1336   CALCIUM 8.3 (L) 08/31/2022 0034   CALCIUM 8.6 12/31/2013 1336   GFRNONAA 31 (L) 08/31/2022 0034   GFRNONAA >60 12/31/2013 1336   GFRAA >60 01/29/2018 1705   GFRAA >60 12/31/2013 1336   CBC    Component Value Date/Time   WBC 1.2 (LL) 08/31/2022 0034   RBC 2.62  (L) 08/31/2022 0034   HGB 7.5 (L) 08/31/2022 0034   HGB 13.9 12/31/2013 1336   HCT 23.8 (L) 08/31/2022 0034   HCT 43.0 12/31/2013 1336   PLT 133 (L) 08/31/2022 0034   PLT 184 12/31/2013 1336   MCV 90.8 08/31/2022 0034   MCV 81 12/31/2013 1336   MCH 28.6 08/31/2022 0034   MCHC 31.5 08/31/2022 0034   RDW 16.2 (H) 08/31/2022 0034   RDW 14.4 12/31/2013 1336   LYMPHSABS 0.4 (L) 08/30/2022 0044   MONOABS 0.1 08/30/2022 0044   EOSABS 0.0 08/30/2022 0044   BASOSABS 0.0 08/30/2022 0044   Assessment/Plan:  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  Patient has had multiple  consultations with palliative care and discussed goals of care.  It seems like it was decided to pursue dialysis as per patient interest and monitor.  Based on the current functional status, deconditioning and challenges with mobility I am worried that he may not be a good candidate for outpatient dialysis.  -s/p [facility]IJ TDC placed by IR followed by dialysis on 6/21. Will plan for HD on MWF schedule  -CLIP: [facility] MWF 12:20pm chair time.  Tolerated HD in recliner 6/26. Still needs a lot of assistance to get out of bed (hoyer lift, 2+ assist). Will c/w HD in recliner which he fortunately has been tolerating -can plan for permanent access as an outpatient.  Seen by Dr. Karin Lieu who feels that he is too unstable for surgery at this time and will arrange for follow up in a month.     Acute urinary retention: On Flomax and Foley catheter.   Pancytopenia/anemia: hemonc consulted for further evaluation.  Iron saturation 32% with high ferritin.  On ESA. Did not tolerate BM biopsy 6/25. IR reconsulted for another attempt for bone marrow biopsy which was performed successfully on 08/30/22.  Transfuse PRN for hgb <7.  Increased aranesp dose.   Acute on chronic diastolic CHF: Treated with IV diuretics initially.  Now managing volume with HD.   HTN/volume: Continue current antihypertensives, managing volume  with HD.   Metabolic acidosis: Managed with dialysis, discontinued sodium bicarbonate., bicarb wnl   Hypokalemia: Dialyzing with high potassium bath.   Deconditioning - pt remains very weak and requires much assistance to get out of bed.  Will need PT/OT evaluation and ongoing SNF placement.    Irena Cords, MD [facility]

## 2022-09-01 DIAGNOSIS — D61818 Other pancytopenia: Secondary | ICD-10-CM | POA: Diagnosis not present

## 2022-09-01 LAB — COMPREHENSIVE METABOLIC PANEL
ALT: 16 U/L (ref 0–44)
AST: 32 U/L (ref 15–41)
Albumin: 2.5 g/dL — ABNORMAL LOW (ref 3.5–5.0)
Alkaline Phosphatase: 102 U/L (ref 38–126)
Anion gap: 14 (ref 5–15)
BUN: 25 mg/dL — ABNORMAL HIGH (ref 8–23)
CO2: 26 mmol/L (ref 22–32)
Calcium: 8.6 mg/dL — ABNORMAL LOW (ref 8.9–10.3)
Chloride: 96 mmol/L — ABNORMAL LOW (ref 98–111)
Creatinine, Ser: 3.33 mg/dL — ABNORMAL HIGH (ref 0.61–1.24)
GFR, Estimated: 20 mL/min — ABNORMAL LOW (ref >60)
Glucose, Bld: 130 mg/dL — ABNORMAL HIGH (ref 70–99)
Potassium: 4.3 mmol/L (ref 3.5–5.1)
Sodium: 136 mmol/L (ref 135–145)
Total Bilirubin: 1.3 mg/dL — ABNORMAL HIGH (ref 0.3–1.2)
Total Protein: 7.3 g/dL (ref 6.5–8.1)

## 2022-09-01 LAB — GLUCOSE, CAPILLARY
Glucose-Capillary: 130 mg/dL — ABNORMAL HIGH (ref 70–99)
Glucose-Capillary: 123 mg/dL — ABNORMAL HIGH (ref 70–99)
Glucose-Capillary: 167 mg/dL — ABNORMAL HIGH (ref 70–99)
Glucose-Capillary: 155 mg/dL — ABNORMAL HIGH (ref 70–99)

## 2022-09-01 NOTE — Progress Notes (Signed)
PROGRESS NOTE    [name]  ZOX:[id number] DOB: [birth year] DOA: 08/08/2022 PCP: [name], MD  62/M w hypertension, T2DM, CKD stage IV, peripheral vascular disease, history of CVA and chronic heart failure, who presented from SNF for evaluation of low hgb. Recent hospitalization 05/22 to 05/25 for heart failure exacerbation, and anemia with positive stool occult blood test. Required 3 units PRBC transfusion. No invasive procedure required. Follow up hgb was low and patient was referred to the ED for further evaluation. positive left stump pitting edema.  -Labs w BUN 80 cr 5,26, BNP 498, Hb 6.9 w/ iron defi , Wbc 1,9, hgb 6,9 plt 57  -Transfused one unit PRBC with follow up hgb at 7,6  -Positive urinary retention, required in and out catheterization.  -6/15 still volume overloaded,. Further drop in Hgb down to 6,8 and had PRBC transfusion.  -6/16 patient with urinary retention but refused Foley catheter.  -Had [facility] placed and HD started on 6/21.  Nephrology and palliative medicine following.  Oncology consulted and ordered bone marrow biopsy.  Unfortunately, bone marrow biopsy aborted on 6/25 due to orthopnea, desaturation, nausea and emesis.  IR recommends outpatient follow-up.   -Tolerated HD in recliner 6/26. Still needs a lot of assistance to get out of bed (hoyer lift, 2+ assist).  -6/29 patient with improvement in nausea and back pain. Continue very weak and deconditioned. Poor oral intake.  -6/30 mentation and gastrointestinal symptoms improving, plan for vascular surgery consult for AV fistula creation.  -7/01 tolerating well HD.  -7/02 vascular surgery recommended fistula creation as outpatient, once comorbid conditions are more stable.  -7/3, IR reconsulted for bone marrow biopsy, -7/5, bone marrow aspiration and biopsy completed -Also complicated by severe debility and deconditioning, requiring Hoyer lift and 2+ assistance to get out of bed for HD  Subjective: -Abdomen is  better today, no nausea this morning  Assessment and Plan:  Pancytopenia (HCC) Symptomatic anemia.  Recent GI evaluation, recommended conservative management.  -Sp PRBC transfusion for a total of 3 since admission.  -Seen by Heme, BM biopsy recommended -IR consulted, 6/25 unable to perform bone marrow biopsy on account of medical stability -IR reconsulted, bone marrow biopsy completed 7/5 -Discussed with hematology, defer G-CSF at this time unless he becomes septic or unstable until MDS ruled out,  -Declined labs today, check CBC with dialysis tomorrow  Acute renal failure superimposed on stage 4 chronic kidney disease (HCC) New ESRD Hypokalemia. Hyponatremia.  -multifactorial.  -TDC placed on 06/21.  -Started on HD, significantly limited by debility, requiring Hoyer lift and 2+ assistance to get out of bed, continue dialysis in recliner -Vascular surgery has recommended fistula creation as outpatient  Abdominal pain, nausea -Continue supportive care, laxatives, Bentyl, PPI, KUB was unremarkable -Also has some component of chronic pain as well  Acute on chronic heart failure with preserved ejection fraction (HFpEF) (HCC) Echocardiogram with preserved LV systolic function. EF 50 to 55%. Moderate LVH, RV systolic function preserved. Mild aortic valve stenosis.  -continue amlodipine, carvedilol and hydralazine.  -Volume managed with HD  Essential hypertension Continue carvedilol, amlodipine and hydralazine.   History of CVA (cerebrovascular accident) Continue supportive medical therapy. PT and OT. Patient will return to SNF when medically stable.   Type 2 diabetes mellitus with hyperlipidemia (HCC) Continue insulin sliding scale for glucose cover and monitoring. Basal insulin on hold  PAD (peripheral artery disease) (HCC) Left BKA.   Depression Continue with PRN alprazolam.  Patient requested to discontinue sertraline due to  hallucinations.   Patient with persistent  pain, multifactorial.  Continue with oral oxycodone and hydromorphone.   Protein-calorie malnutrition, moderate (HCC) Continue with nutritional supplementation.   DVT prophylaxis: SCDs Code Status: DNR Family Communication: No family at bedside Disposition Plan: Back to SNF likely Monday   Pressure injury of buttock, stage 2 (HCC)  Active Pressure Injury/Wound(s)     Pressure Ulcer  Duration          Pressure Injury 08/22/22 Sacrum Stage 2 -  Partial thickness loss of dermis presenting as a shallow open injury with a red, pink wound bed without slough. skin tear 3 days            Consultants:    Procedures:   Antimicrobials:    Objective: Vitals:   08/31/22 2100 09/01/22 0000 09/01/22 0500 09/01/22 0809  BP: (!) 184/84 (!) 165/76 (!) 169/86 (!) 158/85  Pulse: 82 79 78 77  Resp: 10 15 12 12   Temp: 98.9 F (37.2 C) 98 F (36.7 C) 99.2 F (37.3 C) 98.5 F (36.9 C)  TempSrc: Oral Oral Oral Oral  SpO2: 97% 100% 97% 100%  Weight:   93.8 kg   Height:        Intake/Output Summary (Last 24 hours) at 09/01/2022 1111 Last data filed at 09/01/2022 0500 Gross per 24 hour  Intake 240 ml  Output 400 ml  Net -160 ml   Filed Weights   08/30/22 0502 08/31/22 0433 09/01/22 0500  Weight: 98.5 kg 97.7 kg 93.8 kg    Examination:  Chronically ill male sitting up in bed, AAOx3 HEENT: No JVD CVS: S1-S2, regular rhythm, systolic murmur Lungs: Decreased breath sounds at the bases Abdomen: Soft, nontender, bowel sounds present Extremities: Trace edema, left BKA   Data Reviewed:   CBC: Recent Labs  Lab 08/26/22 0056 08/28/22 0024 08/30/22 0044 08/31/22 0034  WBC  --  1.3* 1.1* 1.2*  NEUTROABS  --   --  0.4*  --   HGB 7.0* 7.3* 7.4* 7.5*  HCT 22.6* 23.4* 23.4* 23.8*  MCV  --  91.8 91.4 90.8  PLT  --  112* 124* 133*   Basic Metabolic Panel: Recent Labs  Lab 08/26/22 0056 08/28/22 0024 08/30/22 0302 08/31/22 0034 09/01/22 0802  NA 133* 133* 133* 135 136   K 3.4* 3.6 3.9 3.9 4.3  CL 97* 98 97* 98 96*  CO2 26 27 28 29 26   GLUCOSE 142* 123* 136* 126* 130*  BUN 39* 29* 25* 15 25*  CREATININE 3.68* 3.21* 3.12* 2.35* 3.33*  CALCIUM 7.9* 8.0* 8.0* 8.3* 8.6*  PHOS 3.8  --  3.3  --   --    GFR: Estimated Creatinine Clearance: 25.9 mL/min (A) (by C-G formula based on SCr of 3.33 mg/dL (H)). Liver Function Tests: Recent Labs  Lab 08/26/22 0056 08/30/22 0302 09/01/22 0802  AST  --   --  32  ALT  --   --  16  ALKPHOS  --   --  102  BILITOT  --   --  1.3*  PROT  --   --  7.3  ALBUMIN 2.5* 2.4* 2.5*   No results for input(s): "LIPASE", "AMYLASE" in the last 168 hours. No results for input(s): "AMMONIA" in the last 168 hours. Coagulation Profile: No results for input(s): "INR", "PROTIME" in the last 168 hours. Cardiac Enzymes: No results for input(s): "CKTOTAL", "CKMB", "CKMBINDEX", "TROPONINI" in the last 168 hours. BNP (last 3 results) No results for input(s): "PROBNP" in the  last 8760 hours. HbA1C: No results for input(s): "HGBA1C" in the last 72 hours. CBG: Recent Labs  Lab 08/31/22 0557 08/31/22 1119 08/31/22 1551 08/31/22 2112 09/01/22 0613  GLUCAP 114* 108* 110* 107* 130*   Lipid Profile: No results for input(s): "CHOL", "HDL", "LDLCALC", "TRIG", "CHOLHDL", "LDLDIRECT" in the last 72 hours. Thyroid Function Tests: No results for input(s): "TSH", "T4TOTAL", "FREET4", "T3FREE", "THYROIDAB" in the last 72 hours. Anemia Panel: No results for input(s): "VITAMINB12", "FOLATE", "FERRITIN", "TIBC", "IRON", "RETICCTPCT" in the last 72 hours. Urine analysis:    Component Value Date/Time   COLORURINE YELLOW 08/09/2022 0530   APPEARANCEUR CLEAR 08/09/2022 0530   APPEARANCEUR Clear 12/31/2013 2116   LABSPEC 1.025 08/09/2022 0530   LABSPEC 1.032 12/31/2013 2116   PHURINE 5.5 08/09/2022 0530   GLUCOSEU NEGATIVE 08/09/2022 0530   GLUCOSEU >=500 12/31/2013 2116   HGBUR NEGATIVE 08/09/2022 0530   BILIRUBINUR NEGATIVE 08/09/2022  0530   BILIRUBINUR Negative 12/31/2013 2116   KETONESUR NEGATIVE 08/09/2022 0530   PROTEINUR 100 (A) 08/09/2022 0530   UROBILINOGEN 1.0 08/28/2013 1709   NITRITE NEGATIVE 08/09/2022 0530   LEUKOCYTESUR NEGATIVE 08/09/2022 0530   LEUKOCYTESUR Negative 12/31/2013 2116   Sepsis Labs: @LABRCNTIP (procalcitonin:4,lacticidven:4)  )No results found for this or any previous visit (from the past 240 hour(s)).   Radiology Studies: DG Abd Portable 1V  Result Date: 08/31/2022 CLINICAL DATA:  Abdominal pain. EXAM: PORTABLE ABDOMEN - 1 VIEW COMPARISON:  12/10/2020. FINDINGS: No bowel dilation to suggest obstruction. Soft tissues are poorly defined. No convincing renal or ureteral stones. No acute skeletal abnormality. IMPRESSION: 1. No acute findings.  No evidence of bowel obstruction. Electronically Signed   By: Amie Portland M.D.   On: 08/31/2022 13:11     Scheduled Meds:  amLODipine  10 mg Oral Daily   atorvastatin  80 mg Oral QHS   carvedilol  25 mg Oral BID WC   Chlorhexidine Gluconate Cloth  6 each Topical Q0600   darbepoetin (ARANESP) injection - NON-DIALYSIS  200 mcg Subcutaneous Q Wed-1800   hydrALAZINE  50 mg Oral 4 times per day   insulin aspart  0-5 Units Subcutaneous QHS   insulin aspart  0-6 Units Subcutaneous TID WC   multivitamin  1 tablet Oral QHS   mouth rinse  15 mL Mouth Rinse 4 times per day   pantoprazole  40 mg Oral BID   polyethylene glycol  17 g Oral BID   saccharomyces boulardii  250 mg Oral BID   sodium chloride flush  3 mL Intravenous Q12H   tamsulosin  0.4 mg Oral QPC supper   Continuous Infusions:     LOS: 24 days    Time spent:    Zannie Cove, MD [facility]   09/01/2022, 11:11 AM

## 2022-09-01 NOTE — Plan of Care (Signed)
  Problem: Education: Goal: Ability to demonstrate management of disease process will improve Outcome: Not Progressing   

## 2022-09-01 NOTE — Progress Notes (Signed)
Patient verbalized that he is not hungry and does want any food. Oral fluid given. Per lab,  samples collected this AM were clotted and patient refused to be stick again forlab. MD notified. Will continue to monitor.

## 2022-09-01 NOTE — Progress Notes (Signed)
Patient ID: [name], male   DOB: 1960/07/28, 62 y.o.   MRN: [id number] S: Feeling better today. O:BP (!) 158/85 (BP Location: Left Arm)   Pulse 77   Temp 98.5 F (36.9 C) (Oral)   Resp 12   Ht 5\' 8"  (1.727 m)   Wt 93.8 kg   SpO2 100%   BMI 31.44 kg/m   Intake/Output Summary (Last 24 hours) at 09/01/2022 1043 Last data filed at 09/01/2022 0500 Gross per 24 hour  Intake 240 ml  Output 400 ml  Net -160 ml   Intake/Output: I/O last 3 completed shifts: In: 240 [P.O.:240] Out: 460 [Urine:460]  Intake/Output this shift:  No intake/output data recorded. Weight change: -3.9 kg Gen: chronically ill-appearing, NAD CVS: RRR Resp:CTA Abd: +BS, soft, NT/ND Ext: trace edema on RLE, s/p LBKA  Recent Labs  Lab 08/26/22 0056 08/28/22 0024 08/30/22 0302 08/31/22 0034 09/01/22 0802  NA 133* 133* 133* 135 136  K 3.4* 3.6 3.9 3.9 4.3  CL 97* 98 97* 98 96*  CO2 26 27 28 29 26   GLUCOSE 142* 123* 136* 126* 130*  BUN 39* 29* 25* 15 25*  CREATININE 3.68* 3.21* 3.12* 2.35* 3.33*  ALBUMIN 2.5*  --  2.4*  --  2.5*  CALCIUM 7.9* 8.0* 8.0* 8.3* 8.6*  PHOS 3.8  --  3.3  --   --   AST  --   --   --   --  32  ALT  --   --   --   --  16   Liver Function Tests: Recent Labs  Lab 08/26/22 0056 08/30/22 0302 09/01/22 0802  AST  --   --  32  ALT  --   --  16  ALKPHOS  --   --  102  BILITOT  --   --  1.3*  PROT  --   --  7.3  ALBUMIN 2.5* 2.4* 2.5*   No results for input(s): "LIPASE", "AMYLASE" in the last 168 hours. No results for input(s): "AMMONIA" in the last 168 hours. CBC: Recent Labs  Lab 08/28/22 0024 08/30/22 0044 08/31/22 0034  WBC 1.3* 1.1* 1.2*  NEUTROABS  --  0.4*  --   HGB 7.3* 7.4* 7.5*  HCT 23.4* 23.4* 23.8*  MCV 91.8 91.4 90.8  PLT 112* 124* 133*   Cardiac Enzymes: No results for input(s): "CKTOTAL", "CKMB", "CKMBINDEX", "TROPONINI" in the last 168 hours. CBG: Recent Labs  Lab 08/31/22 0557 08/31/22 1119 08/31/22 1551 08/31/22 2112 09/01/22 0613   GLUCAP 114* 108* 110* 107* 130*    Iron Studies: No results for input(s): "IRON", "TIBC", "TRANSFERRIN", "FERRITIN" in the last 72 hours. Studies/Results: DG Abd Portable 1V  Result Date: 08/31/2022 CLINICAL DATA:  Abdominal pain. EXAM: PORTABLE ABDOMEN - 1 VIEW COMPARISON:  12/10/2020. FINDINGS: No bowel dilation to suggest obstruction. Soft tissues are poorly defined. No convincing renal or ureteral stones. No acute skeletal abnormality. IMPRESSION: 1. No acute findings.  No evidence of bowel obstruction. Electronically Signed   By: Amie Portland M.D.   On: 08/31/2022 13:11    amLODipine  10 mg Oral Daily   atorvastatin  80 mg Oral QHS   carvedilol  25 mg Oral BID WC   Chlorhexidine Gluconate Cloth  6 each Topical Q0600   darbepoetin (ARANESP) injection - NON-DIALYSIS  200 mcg Subcutaneous Q Wed-1800   hydrALAZINE  50 mg Oral 4 times per day   insulin aspart  0-5 Units Subcutaneous QHS   insulin aspart  0-6 Units Subcutaneous TID WC   multivitamin  1 tablet Oral QHS   mouth rinse  15 mL Mouth Rinse 4 times per day   pantoprazole  40 mg Oral BID   polyethylene glycol  17 g Oral BID   saccharomyces boulardii  250 mg Oral BID   sodium chloride flush  3 mL Intravenous Q12H   tamsulosin  0.4 mg Oral QPC supper    BMET    Component Value Date/Time   NA 136 09/01/2022 0802   NA 135 (L) 12/31/2013 1336   K 4.3 09/01/2022 0802   K 3.8 12/31/2013 1336   CL 96 (L) 09/01/2022 0802   CL 101 12/31/2013 1336   CO2 26 09/01/2022 0802   CO2 25 12/31/2013 1336   GLUCOSE 130 (H) 09/01/2022 0802   GLUCOSE 249 (H) 12/31/2013 1336   BUN 25 (H) 09/01/2022 0802   BUN 8 12/31/2013 1336   CREATININE 3.33 (H) 09/01/2022 0802   CREATININE 0.88 12/31/2013 1336   CALCIUM 8.6 (L) 09/01/2022 0802   CALCIUM 8.6 12/31/2013 1336   GFRNONAA 20 (L) 09/01/2022 0802   GFRNONAA >60 12/31/2013 1336   GFRAA >60 01/29/2018 1705   GFRAA >60 12/31/2013 1336   CBC    Component Value Date/Time   WBC 1.2 (LL)  08/31/2022 0034   RBC 2.62 (L) 08/31/2022 0034   HGB 7.5 (L) 08/31/2022 0034   HGB 13.9 12/31/2013 1336   HCT 23.8 (L) 08/31/2022 0034   HCT 43.0 12/31/2013 1336   PLT 133 (L) 08/31/2022 0034   PLT 184 12/31/2013 1336   MCV 90.8 08/31/2022 0034   MCV 81 12/31/2013 1336   MCH 28.6 08/31/2022 0034   MCHC 31.5 08/31/2022 0034   RDW 16.2 (H) 08/31/2022 0034   RDW 14.4 12/31/2013 1336   LYMPHSABS 0.4 (L) 08/30/2022 0044   MONOABS 0.1 08/30/2022 0044   EOSABS 0.0 08/30/2022 0044   BASOSABS 0.0 08/30/2022 0044    Assessment/Plan:   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  Patient has had multiple consultations with palliative care and discussed goals of care.  It seems like it was decided to pursue dialysis as per patient interest and monitor.  Based on the current functional status, deconditioning and challenges with mobility I am worried that he may not be a good candidate for outpatient dialysis.  -s/p RIJ TDC placed by IR followed by dialysis on 6/21. Will plan for HD on MWF schedule   -CLIP: [facility] MWF 12:20pm chair time.  Tolerated HD in recliner 6/26. Still needs a lot of assistance to get out of bed (hoyer lift, 2+ assist). Will c/w HD in recliner which he fortunately has been tolerating -can plan for permanent access as an outpatient.  Seen by Dr. Karin Lieu who feels that he is too unstable for surgery at this time and will arrange for follow up in a month.     Acute urinary retention: On Flomax and Foley catheter.   Pancytopenia/anemia: hemonc consulted for further evaluation.  Iron saturation 32% with high ferritin.  On ESA. Did not tolerate BM biopsy 6/25. IR reconsulted for another attempt for bone marrow biopsy which was performed successfully on 08/30/22.  Transfuse PRN for hgb <7.  Increased aranesp dose.   Acute on chronic diastolic CHF: Treated with IV diuretics initially.  Now managing volume with HD.   HTN/volume: Continue current  antihypertensives, managing volume with HD.   Metabolic acidosis: Managed  with dialysis, discontinued sodium bicarbonate., bicarb wnl   Hypokalemia: Dialyzing with high potassium bath.   Deconditioning - pt remains very weak and requires much assistance to get out of bed.  Will need PT/OT evaluation and ongoing SNF placement.  Severe protein malnutrition - continue with supplements.   Irena Cords, MD [facility]

## 2022-09-02 DIAGNOSIS — D61818 Other pancytopenia: Secondary | ICD-10-CM | POA: Diagnosis not present

## 2022-09-02 LAB — CBC
HCT: 21.9 % — ABNORMAL LOW (ref 39.0–52.0)
Hemoglobin: 6.8 g/dL — CL (ref 13.0–17.0)
MCH: 28.6 pg (ref 26.0–34.0)
MCHC: 31.1 g/dL (ref 30.0–36.0)
MCV: 92 fL (ref 80.0–100.0)
Platelets: 143 10*3/uL — ABNORMAL LOW (ref 150–400)
RBC: 2.38 MIL/uL — ABNORMAL LOW (ref 4.22–5.81)
RDW: 16.8 % — ABNORMAL HIGH (ref 11.5–15.5)
WBC: 1.3 10*3/uL — CL (ref 4.0–10.5)
nRBC: 0 % (ref 0.0–0.2)

## 2022-09-02 LAB — COMPREHENSIVE METABOLIC PANEL
ALT: 14 U/L (ref 0–44)
AST: 23 U/L (ref 15–41)
Albumin: 2.4 g/dL — ABNORMAL LOW (ref 3.5–5.0)
Alkaline Phosphatase: 91 U/L (ref 38–126)
Anion gap: 10 (ref 5–15)
BUN: 31 mg/dL — ABNORMAL HIGH (ref 8–23)
CO2: 26 mmol/L (ref 22–32)
Calcium: 8 mg/dL — ABNORMAL LOW (ref 8.9–10.3)
Chloride: 96 mmol/L — ABNORMAL LOW (ref 98–111)
Creatinine, Ser: 3.79 mg/dL — ABNORMAL HIGH (ref 0.61–1.24)
GFR, Estimated: 17 mL/min — ABNORMAL LOW (ref >60)
Glucose, Bld: 158 mg/dL — ABNORMAL HIGH (ref 70–99)
Potassium: 3.4 mmol/L — ABNORMAL LOW (ref 3.5–5.1)
Sodium: 132 mmol/L — ABNORMAL LOW (ref 135–145)
Total Bilirubin: 0.7 mg/dL (ref 0.3–1.2)
Total Protein: 7.1 g/dL (ref 6.5–8.1)

## 2022-09-02 LAB — GLUCOSE, CAPILLARY
Glucose-Capillary: 160 mg/dL — ABNORMAL HIGH (ref 70–99)
Glucose-Capillary: 144 mg/dL — ABNORMAL HIGH (ref 70–99)
Glucose-Capillary: 149 mg/dL — ABNORMAL HIGH (ref 70–99)
Glucose-Capillary: 121 mg/dL — ABNORMAL HIGH (ref 70–99)

## 2022-09-02 LAB — TYPE AND SCREEN

## 2022-09-02 LAB — BPAM RBC: Blood Product Expiration Date: 202408072359

## 2022-09-02 LAB — PHOSPHORUS: Phosphorus: 3.5 mg/dL (ref 2.5–4.6)

## 2022-09-02 LAB — PREPARE RBC (CROSSMATCH)

## 2022-09-02 MED ORDER — HEPARIN SODIUM (PORCINE) 1000 UNIT/ML IJ SOLN
INTRAMUSCULAR | Status: AC
  Administered 2022-09-02: 3800 [IU]
  Filled 2022-09-02: qty 4

## 2022-09-02 MED ORDER — ALBUMIN HUMAN 25 % IV SOLN
INTRAVENOUS | Status: AC
  Filled 2022-09-02: qty 100

## 2022-09-02 MED ORDER — SODIUM CHLORIDE 0.9% IV SOLUTION: Freq: Once | INTRAVENOUS | Status: AC

## 2022-09-02 NOTE — Progress Notes (Signed)
Patient agreed to allow blood draw. Phlebotomy notified to come back and draw.

## 2022-09-02 NOTE — Progress Notes (Addendum)
Received patient in HD recliner.Awake,alert and oriented x 4.Consent verified including blood consent.  Access used:Right HD catheter that worked well..Dressing on date.  Medicines given:  Tylenol 650 mg for headache.                               Zofran 4 mg iv.                               1 unit of blood =320cc. Duration of treatment: 3.5 hours.  Fluid removed:  4.6 liters  Hemo comment: Prescribed UF goal was 3.5 liters. P.A at bedside  said. " Pull as much as he  tolerated" thus 4,280cc was pulled plus 320 cc volume of blood =4.6 liters. Patient tolerated the treatment,not dropping his SBP below 110,Albumin was not given then.He became nauseous at the end of the treatment.  Hand off to the patient's nurse.

## 2022-09-02 NOTE — Progress Notes (Signed)
PROGRESS NOTE    [name]  ZOX:[id number] DOB: 15-Jan-1961 DOA: 08/08/2022 PCP: [name], MD  62/M w hypertension, T2DM, CKD stage IV, peripheral vascular disease, history of CVA and chronic heart failure, who presented from SNF for evaluation of low hgb. Recent hospitalization 05/22 to 05/25 for heart failure exacerbation, and anemia with positive stool occult blood test. Required 3 units PRBC transfusion. No invasive procedure required. Follow up hgb was low and patient was referred to the ED for further evaluation. positive left stump pitting edema.  -Labs w BUN 80 cr 5,26, BNP 498, Hb 6.9 w/ iron defi , Wbc 1,9, hgb 6,9 plt 57  -Transfused one unit PRBC with follow up hgb at 7,6  -Positive urinary retention, required in and out catheterization.  -6/15 still volume overloaded,. Further drop in Hgb down to 6,8 and had PRBC transfusion.  -6/16 patient with urinary retention but refused Foley catheter.  -Had [facility] placed and HD started on 6/21.  Nephrology and palliative medicine following.  Oncology consulted and ordered bone marrow biopsy.  Unfortunately, bone marrow biopsy aborted on 6/25 due to orthopnea, desaturation, nausea and emesis.  IR recommends outpatient follow-up.   -Tolerated HD in recliner 6/26. Still needs a lot of assistance to get out of bed (hoyer lift, 2+ assist).  -6/29 patient with improvement in nausea and back pain. Continue very weak and deconditioned. Poor oral intake.  -6/30 mentation and gastrointestinal symptoms improving, plan for vascular surgery consult for AV fistula creation.  -7/01 tolerating well HD.  -7/02 vascular surgery recommended fistula creation as outpatient, once comorbid conditions are more stable.  -7/3, IR reconsulted for bone marrow biopsy, -7/5, bone marrow aspiration and biopsy completed -Also complicated by severe debility and deconditioning, requiring Hoyer lift and 2+ assistance to get out of bed for HD  Subjective: -Abdomen is  better today, no nausea this morning  Assessment and Plan:  Pancytopenia (HCC) Symptomatic anemia.  Recent GI evaluation, recommended conservative management.  -Sp PRBC transfusion for a total of 3 since admission.  -Seen by Heme, BM biopsy recommended -IR consulted, 6/25 unable to perform bone marrow biopsy on account of medical stability -IR reconsulted, bone marrow biopsy completed 7/5 -Discussed with hematology, defer G-CSF at this time unless he becomes septic or unstable until MDS ruled out,  -Hemoglobin down to 6.8 today, transfuse PRBC, hopefully bone marrow biopsy results will be back soon  Acute renal failure superimposed on stage 4 chronic kidney disease (HCC) New ESRD Hypokalemia. Hyponatremia.  -multifactorial.  -After multiple discussions with palliative care decision was made to pursue dialysis per patient -[facility] placed on 06/21.  -Started on HD, significantly limited by debility, requiring Hoyer lift and 2+ assistance to get out of bed, continue dialysis in recliner -Vascular surgery has recommended fistula creation as outpatient  Abdominal pain, nausea -Continue supportive care, laxatives, Bentyl, PPI, KUB was unremarkable -Also has some component of chronic pain as well  Acute on chronic heart failure with preserved ejection fraction (HFpEF) (HCC) Echocardiogram with preserved LV systolic function. EF 50 to 55%. Moderate LVH, RV systolic function preserved. Mild aortic valve stenosis.  -continue amlodipine, carvedilol and hydralazine.  -Volume managed with HD  Essential hypertension Continue carvedilol, amlodipine and hydralazine.   History of CVA (cerebrovascular accident) Continue supportive medical therapy. PT and OT. Patient will return to SNF when medically stable.   Type 2 diabetes mellitus with hyperlipidemia (HCC) Continue insulin sliding scale for glucose cover and monitoring. Basal insulin on hold  PAD (peripheral artery disease) (HCC) Left BKA.    Depression Continue with PRN alprazolam.  Patient requested to discontinue sertraline due to hallucinations.   Patient with persistent pain, multifactorial.  Continue with oral oxycodone and hydromorphone.   Protein-calorie malnutrition, moderate (HCC) Continue with nutritional supplementation.   DVT prophylaxis: SCDs Code Status: DNR Family Communication: No family at bedside Disposition Plan: Back to SNF, needs to be able to tolerate HD in recliner   Pressure injury of buttock, stage 2 (HCC)  Active Pressure Injury/Wound(s)     Pressure Ulcer  Duration          Pressure Injury 08/22/22 Sacrum Stage 2 -  Partial thickness loss of dermis presenting as a shallow open injury with a red, pink wound bed without slough. skin tear 3 days            Consultants:    Procedures:   Antimicrobials:    Objective: Vitals:   09/02/22 1055 09/02/22 1100 09/02/22 1119 09/02/22 1130  BP: 129/69 116/73 106/67 123/71  Pulse: 69 70 73 71  Resp: 13 12 17 16   Temp: 98.2 F (36.8 C)  98 F (36.7 C)   TempSrc:      SpO2: 97% 97% 95% 96%  Weight:      Height:        Intake/Output Summary (Last 24 hours) at 09/02/2022 1229 Last data filed at 09/01/2022 2125 Gross per 24 hour  Intake 1200 ml  Output 100 ml  Net 1100 ml   Filed Weights   09/01/22 0500 09/02/22 0500  Weight: 93.8 kg 94.9 kg    Examination:  Chronically ill male sitting up in bed, AAOx3 HEENT: No JVD CVS: S1-S2, regular rhythm, systolic murmur Lungs: Decreased breath sounds at the bases Abdomen: Soft, nontender, bowel sounds present Extremities: Trace edema, left BKA   Data Reviewed:   CBC: Recent Labs  Lab 08/28/22 0024 08/30/22 0044 08/31/22 0034 09/02/22 0641  WBC 1.3* 1.1* 1.2* 1.3*  NEUTROABS  --  0.4*  --   --   HGB 7.3* 7.4* 7.5* 6.8*  HCT 23.4* 23.4* 23.8* 21.9*  MCV 91.8 91.4 90.8 92.0  PLT 112* 124* 133* 143*   Basic Metabolic Panel: Recent Labs  Lab 08/28/22 0024  08/30/22 0302 08/31/22 0034 09/01/22 0802 09/02/22 0641  NA 133* 133* 135 136 132*  K 3.6 3.9 3.9 4.3 3.4*  CL 98 97* 98 96* 96*  CO2 27 28 29 26 26   GLUCOSE 123* 136* 126* 130* 158*  BUN 29* 25* 15 25* 31*  CREATININE 3.21* 3.12* 2.35* 3.33* 3.79*  CALCIUM 8.0* 8.0* 8.3* 8.6* 8.0*  PHOS  --  3.3  --   --  3.5   GFR: Estimated Creatinine Clearance: 22.9 mL/min (A) (by C-G formula based on SCr of 3.79 mg/dL (H)). Liver Function Tests: Recent Labs  Lab 08/30/22 0302 09/01/22 0802 09/02/22 0641  AST  --  32 23  ALT  --  16 14  ALKPHOS  --  102 91  BILITOT  --  1.3* 0.7  PROT  --  7.3 7.1  ALBUMIN 2.4* 2.5* 2.4*   No results for input(s): "LIPASE", "AMYLASE" in the last 168 hours. No results for input(s): "AMMONIA" in the last 168 hours. Coagulation Profile: No results for input(s): "INR", "PROTIME" in the last 168 hours. Cardiac Enzymes: No results for input(s): "CKTOTAL", "CKMB", "CKMBINDEX", "TROPONINI" in the last 168 hours. BNP (last 3 results) No results for input(s): "PROBNP" in the last 8760  hours. HbA1C: No results for input(s): "HGBA1C" in the last 72 hours. CBG: Recent Labs  Lab 09/01/22 0613 09/01/22 1112 09/01/22 1529 09/01/22 2035 09/02/22 0607  GLUCAP 130* 123* 167* 155* 160*   Lipid Profile: No results for input(s): "CHOL", "HDL", "LDLCALC", "TRIG", "CHOLHDL", "LDLDIRECT" in the last 72 hours. Thyroid Function Tests: No results for input(s): "TSH", "T4TOTAL", "FREET4", "T3FREE", "THYROIDAB" in the last 72 hours. Anemia Panel: No results for input(s): "VITAMINB12", "FOLATE", "FERRITIN", "TIBC", "IRON", "RETICCTPCT" in the last 72 hours. Urine analysis:    Component Value Date/Time   COLORURINE YELLOW 08/09/2022 0530   APPEARANCEUR CLEAR 08/09/2022 0530   APPEARANCEUR Clear 12/31/2013 2116   LABSPEC 1.025 08/09/2022 0530   LABSPEC 1.032 12/31/2013 2116   PHURINE 5.5 08/09/2022 0530   GLUCOSEU NEGATIVE 08/09/2022 0530   GLUCOSEU >=500  12/31/2013 2116   HGBUR NEGATIVE 08/09/2022 0530   BILIRUBINUR NEGATIVE 08/09/2022 0530   BILIRUBINUR Negative 12/31/2013 2116   KETONESUR NEGATIVE 08/09/2022 0530   PROTEINUR 100 (A) 08/09/2022 0530   UROBILINOGEN 1.0 08/28/2013 1709   NITRITE NEGATIVE 08/09/2022 0530   LEUKOCYTESUR NEGATIVE 08/09/2022 0530   LEUKOCYTESUR Negative 12/31/2013 2116   Sepsis Labs: @LABRCNTIP (procalcitonin:4,lacticidven:4)  )No results found for this or any previous visit (from the past 240 hour(s)).   Radiology Studies: No results found.   Scheduled Meds:  sodium chloride   Intravenous Once   amLODipine  10 mg Oral Daily   atorvastatin  80 mg Oral QHS   carvedilol  25 mg Oral BID WC   Chlorhexidine Gluconate Cloth  6 each Topical Q0600   darbepoetin (ARANESP) injection - NON-DIALYSIS  200 mcg Subcutaneous Q Wed-1800   hydrALAZINE  50 mg Oral 4 times per day   insulin aspart  0-5 Units Subcutaneous QHS   insulin aspart  0-6 Units Subcutaneous TID WC   multivitamin  1 tablet Oral QHS   mouth rinse  15 mL Mouth Rinse 4 times per day   pantoprazole  40 mg Oral BID   polyethylene glycol  17 g Oral BID   saccharomyces boulardii  250 mg Oral BID   sodium chloride flush  3 mL Intravenous Q12H   tamsulosin  0.4 mg Oral QPC supper   Continuous Infusions:  albumin human        LOS: 25 days    Time spent:    Zannie Cove, MD [facility]   09/02/2022, 12:29 PM

## 2022-09-02 NOTE — Progress Notes (Signed)
[facility] Hospital Liaison Note  This patient is currently enrolled in [facility] outpatient-based palliative care.   [facility] will continue to follow for discharge disposition.   Please call for any outpatient based palliative care related questions or concerns.   Thank you.   Shanita Wicker, LCSW [facility] Hospital Liaison [phone]  

## 2022-09-02 NOTE — Progress Notes (Signed)
Patient refused lab drawn this morning.

## 2022-09-02 NOTE — Progress Notes (Signed)
Patient ID: [name], male   DOB: 1960-03-14, 62 y.o.   MRN: [id number] S: Feeling "ok"  today.  Having HD in recliner and tolerating it ok.    O:BP 122/71   Pulse 70   Temp (!) 97 F (36.1 C)   Resp 14   Ht 5\' 8"  (1.727 m)   Wt 94.9 kg   SpO2 95%   BMI 31.81 kg/m   Intake/Output Summary (Last 24 hours) at 09/02/2022 1028 Last data filed at 09/01/2022 2125 Gross per 24 hour  Intake 1440 ml  Output 100 ml  Net 1340 ml    Intake/Output: I/O last 3 completed shifts: In: 1680 [P.O.:1680] Out: 350 [Urine:350]  Intake/Output this shift:  No intake/output data recorded. Weight change: 1.1 kg Gen: chronically ill-appearing, NAD CVS: RRR Resp:CTA Abd: +BS, soft, NT/ND Ext: chronic woody trace edema on RLE, s/p LBKA  Recent Labs  Lab 08/28/22 0024 08/30/22 0302 08/31/22 0034 09/01/22 0802 09/02/22 0641  NA 133* 133* 135 136 132*  K 3.6 3.9 3.9 4.3 3.4*  CL 98 97* 98 96* 96*  CO2 27 28 29 26 26   GLUCOSE 123* 136* 126* 130* 158*  BUN 29* 25* 15 25* 31*  CREATININE 3.21* 3.12* 2.35* 3.33* 3.79*  ALBUMIN  --  2.4*  --  2.5* 2.4*  CALCIUM 8.0* 8.0* 8.3* 8.6* 8.0*  PHOS  --  3.3  --   --  3.5  AST  --   --   --  32 23  ALT  --   --   --  16 14    Liver Function Tests: Recent Labs  Lab 08/30/22 0302 09/01/22 0802 09/02/22 0641  AST  --  32 23  ALT  --  16 14  ALKPHOS  --  102 91  BILITOT  --  1.3* 0.7  PROT  --  7.3 7.1  ALBUMIN 2.4* 2.5* 2.4*    No results for input(s): "LIPASE", "AMYLASE" in the last 168 hours. No results for input(s): "AMMONIA" in the last 168 hours. CBC: Recent Labs  Lab 08/28/22 0024 08/30/22 0044 08/31/22 0034 09/02/22 0641  WBC 1.3* 1.1* 1.2* 1.3*  NEUTROABS  --  0.4*  --   --   HGB 7.3* 7.4* 7.5* 6.8*  HCT 23.4* 23.4* 23.8* 21.9*  MCV 91.8 91.4 90.8 92.0  PLT 112* 124* 133* 143*    Cardiac Enzymes: No results for input(s): "CKTOTAL", "CKMB", "CKMBINDEX", "TROPONINI" in the last 168 hours. CBG: Recent Labs  Lab  09/01/22 0613 09/01/22 1112 09/01/22 1529 09/01/22 2035 09/02/22 0607  GLUCAP 130* 123* 167* 155* 160*     Iron Studies: No results for input(s): "IRON", "TIBC", "TRANSFERRIN", "FERRITIN" in the last 72 hours. Studies/Results: DG Abd Portable 1V  Result Date: 08/31/2022 CLINICAL DATA:  Abdominal pain. EXAM: PORTABLE ABDOMEN - 1 VIEW COMPARISON:  12/10/2020. FINDINGS: No bowel dilation to suggest obstruction. Soft tissues are poorly defined. No convincing renal or ureteral stones. No acute skeletal abnormality. IMPRESSION: 1. No acute findings.  No evidence of bowel obstruction. Electronically Signed   By: Amie Portland M.D.   On: 08/31/2022 13:11    sodium chloride   Intravenous Once   amLODipine  10 mg Oral Daily   atorvastatin  80 mg Oral QHS   carvedilol  25 mg Oral BID WC   Chlorhexidine Gluconate Cloth  6 each Topical Q0600   darbepoetin (ARANESP) injection - NON-DIALYSIS  200 mcg Subcutaneous Q Wed-1800   hydrALAZINE  50  mg Oral 4 times per day   insulin aspart  0-5 Units Subcutaneous QHS   insulin aspart  0-6 Units Subcutaneous TID WC   multivitamin  1 tablet Oral QHS   mouth rinse  15 mL Mouth Rinse 4 times per day   pantoprazole  40 mg Oral BID   polyethylene glycol  17 g Oral BID   saccharomyces boulardii  250 mg Oral BID   sodium chloride flush  3 mL Intravenous Q12H   tamsulosin  0.4 mg Oral QPC supper    BMET    Component Value Date/Time   NA 132 (L) 09/02/2022 0641   NA 135 (L) 12/31/2013 1336   K 3.4 (L) 09/02/2022 0641   K 3.8 12/31/2013 1336   CL 96 (L) 09/02/2022 0641   CL 101 12/31/2013 1336   CO2 26 09/02/2022 0641   CO2 25 12/31/2013 1336   GLUCOSE 158 (H) 09/02/2022 0641   GLUCOSE 249 (H) 12/31/2013 1336   BUN 31 (H) 09/02/2022 0641   BUN 8 12/31/2013 1336   CREATININE 3.79 (H) 09/02/2022 0641   CREATININE 0.88 12/31/2013 1336   CALCIUM 8.0 (L) 09/02/2022 0641   CALCIUM 8.6 12/31/2013 1336   GFRNONAA 17 (L) 09/02/2022 0641   GFRNONAA >60  12/31/2013 1336   GFRAA >60 01/29/2018 1705   GFRAA >60 12/31/2013 1336   CBC    Component Value Date/Time   WBC 1.3 (LL) 09/02/2022 0641   RBC 2.38 (L) 09/02/2022 0641   HGB 6.8 (LL) 09/02/2022 0641   HGB 13.9 12/31/2013 1336   HCT 21.9 (L) 09/02/2022 0641   HCT 43.0 12/31/2013 1336   PLT 143 (L) 09/02/2022 0641   PLT 184 12/31/2013 1336   MCV 92.0 09/02/2022 0641   MCV 81 12/31/2013 1336   MCH 28.6 09/02/2022 0641   MCHC 31.1 09/02/2022 0641   RDW 16.8 (H) 09/02/2022 0641   RDW 14.4 12/31/2013 1336   LYMPHSABS 0.4 (L) 08/30/2022 0044   MONOABS 0.1 08/30/2022 0044   EOSABS 0.0 08/30/2022 0044   BASOSABS 0.0 08/30/2022 0044    Assessment/Plan:   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  Patient has had multiple consultations with palliative care and discussed goals of care.  It seems like it was decided to pursue dialysis as per patient interest and monitor.  Based on the current functional status, deconditioning and challenges with mobility I am worried that he may not be a good candidate for outpatient dialysis.  -s/p RIJ TDC placed by IR followed by dialysis on 6/21. HD on MWF schedule   -CLIP: [facility] MWF 12:20pm chair time.  Tolerated HD in recliner since 6/26. Still needs a lot of assistance to get out of bed (hoyer lift, 2+ assist). Will c/w HD in recliner which he fortunately has been tolerating -can plan for permanent access as an outpatient.  Seen by Dr. Karin Lieu who feels that he is too unstable for surgery at this time and will arrange for follow up in a month.     Acute urinary retention: On Flomax and Foley catheter.   Pancytopenia/anemia: hemonc consulted for further evaluation.  Iron saturation 32% with high ferritin.  On ESA. Did not tolerate BM biopsy 6/25. IR reconsulted for another attempt for bone marrow biopsy which was performed successfully on 08/30/22.  Transfuse PRN for hgb <7.  Increased aranesp dose.   Acute on  chronic diastolic CHF: Treated with IV diuretics initially.  Now managing volume with HD.   HTN/volume: Continue current antihypertensives, managing volume with HD.   Hypokalemia: Dialyzing with high potassium bath.   Deconditioning - pt remains very weak and requires much assistance to get out of bed.  Will need PT/OT evaluation and ongoing SNF placement.  Severe protein malnutrition - continue with supplements.   Estill Bakes MD [facility] Pager [phone]

## 2022-09-03 DIAGNOSIS — D61818 Other pancytopenia: Secondary | ICD-10-CM | POA: Diagnosis not present

## 2022-09-03 LAB — GLUCOSE, CAPILLARY
Glucose-Capillary: 113 mg/dL — ABNORMAL HIGH (ref 70–99)
Glucose-Capillary: 142 mg/dL — ABNORMAL HIGH (ref 70–99)
Glucose-Capillary: 123 mg/dL — ABNORMAL HIGH (ref 70–99)
Glucose-Capillary: 156 mg/dL — ABNORMAL HIGH (ref 70–99)

## 2022-09-03 LAB — BASIC METABOLIC PANEL
Anion gap: 11 (ref 5–15)
BUN: 21 mg/dL (ref 8–23)
CO2: 27 mmol/L (ref 22–32)
Calcium: 8.3 mg/dL — ABNORMAL LOW (ref 8.9–10.3)
Chloride: 94 mmol/L — ABNORMAL LOW (ref 98–111)
Creatinine, Ser: 3.38 mg/dL — ABNORMAL HIGH (ref 0.61–1.24)
GFR, Estimated: 20 mL/min — ABNORMAL LOW (ref >60)
Glucose, Bld: 139 mg/dL — ABNORMAL HIGH (ref 70–99)
Potassium: 3.2 mmol/L — ABNORMAL LOW (ref 3.5–5.1)
Sodium: 132 mmol/L — ABNORMAL LOW (ref 135–145)

## 2022-09-03 LAB — TYPE AND SCREEN
ABO/RH(D): O POS
Antibody Screen: NEGATIVE
Unit division: 0

## 2022-09-03 LAB — BPAM RBC
ISSUE DATE / TIME: 202407081058
Unit Type and Rh: 5100

## 2022-09-03 LAB — CBC
HCT: 26.7 % — ABNORMAL LOW (ref 39.0–52.0)
Hemoglobin: 8.4 g/dL — ABNORMAL LOW (ref 13.0–17.0)
MCH: 28.3 pg (ref 26.0–34.0)
MCHC: 31.5 g/dL (ref 30.0–36.0)
MCV: 89.9 fL (ref 80.0–100.0)
Platelets: 148 10*3/uL — ABNORMAL LOW (ref 150–400)
RBC: 2.97 MIL/uL — ABNORMAL LOW (ref 4.22–5.81)
RDW: 16.7 % — ABNORMAL HIGH (ref 11.5–15.5)
WBC: 1.6 10*3/uL — ABNORMAL LOW (ref 4.0–10.5)
nRBC: 0 % (ref 0.0–0.2)

## 2022-09-03 LAB — SURGICAL PATHOLOGY

## 2022-09-03 MED ORDER — POTASSIUM CHLORIDE CRYS ER 20 MEQ PO TBCR
40.0000 meq | EXTENDED_RELEASE_TABLET | Freq: Once | ORAL | Status: AC
  Administered 2022-09-03: 40 meq via ORAL
  Filled 2022-09-03: qty 2

## 2022-09-03 NOTE — Progress Notes (Incomplete)
Magdalene Patricia [phone] close family friend

## 2022-09-03 NOTE — Progress Notes (Signed)
This chaplain is present for F/U spiritual care.  The Pt. wakes up to a gentle touch on the shoulder. The chaplain understands the Pt. is tired and is open to a visit another day.  The Pt. responds to chaplain questions with a low voice and minimal energy. The Pt. shares he is regularly talking to his sister.  This chaplain will attempt another visit.  Chaplain Stephanie Acre [phone]

## 2022-09-03 NOTE — Progress Notes (Signed)
Pt refused morning labs. Pt unwilling to cooperate with plan of care at this time. Educated pt importance of labs. Pt placed blanket over his face.

## 2022-09-03 NOTE — Progress Notes (Signed)
Update provided to out-pt HD clinic regarding pt's continued hospital stay. Will assist as needed.   Olivia Canter Renal Navigator [phone]

## 2022-09-03 NOTE — Progress Notes (Signed)
PROGRESS NOTE    [name]  ZOX:[id number] DOB: May 14, 1960 DOA: 08/08/2022 PCP: [name], MD  62/M w hypertension, T2DM, CKD stage IV, peripheral vascular disease, history of CVA and chronic heart failure, who presented from SNF for evaluation of low hgb. Recent hospitalization 05/22 to 05/25 for heart failure exacerbation, and anemia with positive stool occult blood test. Required 3 units PRBC transfusion. No invasive procedure required. Follow up hgb was low and patient was referred to the ED for further evaluation. positive left stump pitting edema.  -Labs w BUN 80 cr 5,26, BNP 498, Hb 6.9 w/ iron defi , Wbc 1,9, hgb 6,9 plt 57  -Transfused one unit PRBC with follow up hgb at 7,6  -Positive urinary retention, required in and out catheterization.  -6/15 still volume overloaded,. Further drop in Hgb down to 6,8 and had PRBC transfusion.  -6/16 patient with urinary retention but refused Foley catheter.  -Had [facility] placed and HD started on 6/21.  Nephrology and palliative medicine following.  Oncology consulted and ordered bone marrow biopsy.  Unfortunately, bone marrow biopsy aborted on 6/25 due to orthopnea, desaturation, nausea and emesis.  IR recommends outpatient follow-up.   -Tolerated HD in recliner 6/26. Still needs a lot of assistance to get out of bed (hoyer lift, 2+ assist).  -6/29 patient with improvement in nausea and back pain. Continue very weak and deconditioned. Poor oral intake.  -6/30 mentation and gastrointestinal symptoms improving, plan for vascular surgery consult for AV fistula creation.  -7/01 tolerating well HD.  -7/02 vascular surgery recommended fistula creation as outpatient, once comorbid conditions are more stable.  -7/3, IR reconsulted for bone marrow biopsy, -7/5, bone marrow aspiration and biopsy completed -Also complicated by severe debility and deconditioning, requiring Hoyer lift and 2+ assistance to get out of bed for HD, now doing HD in  recliner -7/8 transfused PRBC for hemoglobin of 6.8  Subjective: -Complains of abdominal pain and distention, had a bowel movement yesterday, oral intake is poor   Assessment and Plan:  Pancytopenia (HCC) Symptomatic anemia.  Recent GI evaluation, recommended conservative management.  -Sp PRBC transfusion for a total of 4 -Seen by Heme, BM biopsy recommended -IR consulted, 6/25 unable to perform bone marrow biopsy on account of medical stability -IR reconsulted, bone marrow biopsy completed 7/5 -Discussed with hematology, defer G-CSF at this time unless he becomes septic or unstable until MDS ruled out,  -Last transfusion yesterday for Hb of 6.8, appears to have overcorrected -Will discuss with hematology regarding bone marrow biopsy  Acute renal failure superimposed on stage 4 chronic kidney disease (HCC) New ESRD Hypokalemia. Hyponatremia.  -multifactorial.  -After multiple discussions with palliative care decision was made to pursue dialysis per patient -[facility] placed on 06/21.  -Started on HD, significantly limited by debility, requiring Hoyer lift and 2+ assistance to get out of bed, continue dialysis in recliner -Vascular surgery has recommended fistula creation as outpatient  Abdominal pain, nausea -Continue supportive care, laxatives, Bentyl, PPI, KUB was unremarkable -Also has some component of chronic pain as well  Acute on chronic heart failure with preserved ejection fraction (HFpEF) (HCC) Echocardiogram with preserved LV systolic function. EF 50 to 55%. Moderate LVH, RV systolic function preserved. Mild aortic valve stenosis.  -continue amlodipine, carvedilol and hydralazine.  -Volume managed with HD  Essential hypertension Continue carvedilol, amlodipine and hydralazine.   History of CVA (cerebrovascular accident) Continue supportive medical therapy. PT and OT. Patient will return to SNF when medically stable.   Type  2 diabetes mellitus with hyperlipidemia  (HCC) Continue insulin sliding scale for glucose cover and monitoring. Basal insulin on hold  PAD (peripheral artery disease) (HCC) Left BKA.   Depression Continue with PRN alprazolam.  Patient requested to discontinue sertraline due to hallucinations.   Patient with persistent pain, multifactorial.  Continue with oral oxycodone and hydromorphone.   Protein-calorie malnutrition, moderate (HCC) Continue with nutritional supplementation.   DVT prophylaxis: SCDs Code Status: DNR Family Communication: No family at bedside Disposition Plan: Back to SNF in 48 hours if stable hopefully will get bone marrow biopsy results prior to that   Pressure injury of buttock, stage 2 (HCC)  Active Pressure Injury/Wound(s)     Pressure Ulcer  Duration          Pressure Injury 08/22/22 Sacrum Stage 2 -  Partial thickness loss of dermis presenting as a shallow open injury with a red, pink wound bed without slough. skin tear 3 days            Consultants:    Procedures:   Antimicrobials:    Objective: Vitals:   09/03/22 0453 09/03/22 0500 09/03/22 0727 09/03/22 1103  BP: (!) 162/77  (!) 146/71 (!) 150/78  Pulse: 70  77 74  Resp: 18  18 18   Temp: 99.7 F (37.6 C)  99.5 F (37.5 C) 99.7 F (37.6 C)  TempSrc: Axillary  Oral Oral  SpO2: 92%  92% 90%  Weight:  94.6 kg    Height:        Intake/Output Summary (Last 24 hours) at 09/03/2022 1222 Last data filed at 09/03/2022 1148 Gross per 24 hour  Intake 449.67 ml  Output 4800 ml  Net -4350.33 ml   Filed Weights   09/02/22 0500 09/03/22 0500  Weight: 94.9 kg 94.6 kg    Examination:  Chronically ill male sitting up in bed, AAOx3 HEENT: No JVD CVS: S1-S2, regular rhythm, systolic murmur Lungs: Decreased breath sounds at the bases Abdomen: Soft, distended, mild tenderness, bowel sounds present Extremities: Trace edema, left BKA   Data Reviewed:   CBC: Recent Labs  Lab 08/28/22 0024 08/30/22 0044 08/31/22 0034  09/02/22 0641 09/03/22 0942  WBC 1.3* 1.1* 1.2* 1.3* 1.6*  NEUTROABS  --  0.4*  --   --   --   HGB 7.3* 7.4* 7.5* 6.8* 8.4*  HCT 23.4* 23.4* 23.8* 21.9* 26.7*  MCV 91.8 91.4 90.8 92.0 89.9  PLT 112* 124* 133* 143* 148*   Basic Metabolic Panel: Recent Labs  Lab 08/30/22 0302 08/31/22 0034 09/01/22 0802 09/02/22 0641 09/03/22 0942  NA 133* 135 136 132* 132*  K 3.9 3.9 4.3 3.4* 3.2*  CL 97* 98 96* 96* 94*  CO2 28 29 26 26 27   GLUCOSE 136* 126* 130* 158* 139*  BUN 25* 15 25* 31* 21  CREATININE 3.12* 2.35* 3.33* 3.79* 3.38*  CALCIUM 8.0* 8.3* 8.6* 8.0* 8.3*  PHOS 3.3  --   --  3.5  --    GFR: Estimated Creatinine Clearance: 25.6 mL/min (A) (by C-G formula based on SCr of 3.38 mg/dL (H)). Liver Function Tests: Recent Labs  Lab 08/30/22 0302 09/01/22 0802 09/02/22 0641  AST  --  32 23  ALT  --  16 14  ALKPHOS  --  102 91  BILITOT  --  1.3* 0.7  PROT  --  7.3 7.1  ALBUMIN 2.4* 2.5* 2.4*   No results for input(s): "LIPASE", "AMYLASE" in the last 168 hours. No results for input(s): "AMMONIA" in  the last 168 hours. Coagulation Profile: No results for input(s): "INR", "PROTIME" in the last 168 hours. Cardiac Enzymes: No results for input(s): "CKTOTAL", "CKMB", "CKMBINDEX", "TROPONINI" in the last 168 hours. BNP (last 3 results) No results for input(s): "PROBNP" in the last 8760 hours. HbA1C: No results for input(s): "HGBA1C" in the last 72 hours. CBG: Recent Labs  Lab 09/02/22 1401 09/02/22 1655 09/02/22 2059 09/03/22 0601 09/03/22 1059  GLUCAP 144* 149* 121* 113* 142*   Lipid Profile: No results for input(s): "CHOL", "HDL", "LDLCALC", "TRIG", "CHOLHDL", "LDLDIRECT" in the last 72 hours. Thyroid Function Tests: No results for input(s): "TSH", "T4TOTAL", "FREET4", "T3FREE", "THYROIDAB" in the last 72 hours. Anemia Panel: No results for input(s): "VITAMINB12", "FOLATE", "FERRITIN", "TIBC", "IRON", "RETICCTPCT" in the last 72 hours. Urine analysis:    Component  Value Date/Time   COLORURINE YELLOW 08/09/2022 0530   APPEARANCEUR CLEAR 08/09/2022 0530   APPEARANCEUR Clear 12/31/2013 2116   LABSPEC 1.025 08/09/2022 0530   LABSPEC 1.032 12/31/2013 2116   PHURINE 5.5 08/09/2022 0530   GLUCOSEU NEGATIVE 08/09/2022 0530   GLUCOSEU >=500 12/31/2013 2116   HGBUR NEGATIVE 08/09/2022 0530   BILIRUBINUR NEGATIVE 08/09/2022 0530   BILIRUBINUR Negative 12/31/2013 2116   KETONESUR NEGATIVE 08/09/2022 0530   PROTEINUR 100 (A) 08/09/2022 0530   UROBILINOGEN 1.0 08/28/2013 1709   NITRITE NEGATIVE 08/09/2022 0530   LEUKOCYTESUR NEGATIVE 08/09/2022 0530   LEUKOCYTESUR Negative 12/31/2013 2116   Sepsis Labs: @LABRCNTIP (procalcitonin:4,lacticidven:4)  )No results found for this or any previous visit (from the past 240 hour(s)).   Radiology Studies: No results found.   Scheduled Meds:  amLODipine  10 mg Oral Daily   atorvastatin  80 mg Oral QHS   carvedilol  25 mg Oral BID WC   Chlorhexidine Gluconate Cloth  6 each Topical Q0600   darbepoetin (ARANESP) injection - NON-DIALYSIS  200 mcg Subcutaneous Q Wed-1800   hydrALAZINE  50 mg Oral 4 times per day   insulin aspart  0-5 Units Subcutaneous QHS   insulin aspart  0-6 Units Subcutaneous TID WC   multivitamin  1 tablet Oral QHS   mouth rinse  15 mL Mouth Rinse 4 times per day   pantoprazole  40 mg Oral BID   polyethylene glycol  17 g Oral BID   saccharomyces boulardii  250 mg Oral BID   sodium chloride flush  3 mL Intravenous Q12H   tamsulosin  0.4 mg Oral QPC supper   Continuous Infusions:      LOS: 26 days    Time spent:    Zannie Cove, MD [facility]   09/03/2022, 12:22 PM

## 2022-09-03 NOTE — TOC Progression Note (Signed)
Transition of Care [facility]) - Progression Note    Patient Details  Name: [name] MRN: [id number] Date of Birth: 01/14/1961  [facility] CM/SW Contact  Delilah Shan, LCSWA Phone Number: 09/03/2022, 2:12 PM  Clinical Narrative:     Patient can return to [facility] when medically ready for dc. CSW will continue to follow and assist with patients dc planning needs.       Expected Discharge Plan and Services                                               Social Determinants of Health (SDOH) Interventions SDOH Screenings   Food Insecurity: No Food Insecurity (08/30/2022)  Housing: Low Risk  (08/30/2022)  Transportation Needs: No Transportation Needs (08/30/2022)  Utilities: Not At Risk (08/30/2022)  Alcohol Screen: Low Risk  (07/30/2017)  Depression (PHQ2-9): Low Risk  (07/10/2020)  Tobacco Use: Medium Risk (08/19/2022)    Readmission Risk Interventions    07/18/2022    3:54 PM 04/02/2022    3:21 PM 03/20/2022   12:20 PM  Readmission Risk Prevention Plan  Transportation Screening Complete Complete Complete  Medication Review Oceanographer) Complete Complete Complete  PCP or Specialist appointment within 3-5 days of discharge  Complete Complete  HRI or Home Care Consult  Complete Complete  SW Recovery Care/Counseling Consult Complete Complete Complete  Palliative Care Screening Not Applicable Not Applicable Not Applicable  Skilled Nursing Facility Complete Complete Complete

## 2022-09-03 NOTE — Progress Notes (Signed)
Patient ID: [name], male   DOB: [birth year], 61 y.o.   MRN: [id number] S: Feeling "ok"  today.  Tolerated HD ok yesterday.  "Not happy" about coming to HD in recliner but will do it.   O:BP (!) 146/71 (BP Location: Left Wrist)   Pulse 77   Temp 99.5 F (37.5 C) (Oral)   Resp 18   Ht 5\' 8"  (1.727 m)   Wt 94.6 kg   SpO2 92%   BMI 31.71 kg/m   Intake/Output Summary (Last 24 hours) at 09/03/2022 0912 Last data filed at 09/02/2022 1701 Gross per 24 hour  Intake 329.67 ml  Output 4700 ml  Net -4370.33 ml    Gen: chronically ill-appearing, NAD CVS: RRR Resp:CTA Abd: +BS, soft, NT/ND Ext: chronic woody trace edema on RLE, s/p LBKA  Recent Labs  Lab 08/28/22 0024 08/30/22 0302 08/31/22 0034 09/01/22 0802 09/02/22 0641  NA 133* 133* 135 136 132*  K 3.6 3.9 3.9 4.3 3.4*  CL 98 97* 98 96* 96*  CO2 27 28 29 26 26   GLUCOSE 123* 136* 126* 130* 158*  BUN 29* 25* 15 25* 31*  CREATININE 3.21* 3.12* 2.35* 3.33* 3.79*  ALBUMIN  --  2.4*  --  2.5* 2.4*  CALCIUM 8.0* 8.0* 8.3* 8.6* 8.0*  PHOS  --  3.3  --   --  3.5  AST  --   --   --  32 23  ALT  --   --   --  16 14    Liver Function Tests: Recent Labs  Lab 08/30/22 0302 09/01/22 0802 09/02/22 0641  AST  --  32 23  ALT  --  16 14  ALKPHOS  --  102 91  BILITOT  --  1.3* 0.7  PROT  --  7.3 7.1  ALBUMIN 2.4* 2.5* 2.4*    No results for input(s): "LIPASE", "AMYLASE" in the last 168 hours. No results for input(s): "AMMONIA" in the last 168 hours. CBC: Recent Labs  Lab 08/28/22 0024 08/30/22 0044 08/31/22 0034 09/02/22 0641  WBC 1.3* 1.1* 1.2* 1.3*  NEUTROABS  --  0.4*  --   --   HGB 7.3* 7.4* 7.5* 6.8*  HCT 23.4* 23.4* 23.8* 21.9*  MCV 91.8 91.4 90.8 92.0  PLT 112* 124* 133* 143*    Cardiac Enzymes: No results for input(s): "CKTOTAL", "CKMB", "CKMBINDEX", "TROPONINI" in the last 168 hours. CBG: Recent Labs  Lab 09/02/22 0607 09/02/22 1401 09/02/22 1655 09/02/22 2059 09/03/22 0601  GLUCAP 160* 144* 149*  121* 113*     Iron Studies: No results for input(s): "IRON", "TIBC", "TRANSFERRIN", "FERRITIN" in the last 72 hours. Studies/Results: No results found.  amLODipine  10 mg Oral Daily   atorvastatin  80 mg Oral QHS   carvedilol  25 mg Oral BID WC   Chlorhexidine Gluconate Cloth  6 each Topical Q0600   darbepoetin (ARANESP) injection - NON-DIALYSIS  200 mcg Subcutaneous Q Wed-1800   hydrALAZINE  50 mg Oral 4 times per day   insulin aspart  0-5 Units Subcutaneous QHS   insulin aspart  0-6 Units Subcutaneous TID WC   multivitamin  1 tablet Oral QHS   mouth rinse  15 mL Mouth Rinse 4 times per day   pantoprazole  40 mg Oral BID   polyethylene glycol  17 g Oral BID   saccharomyces boulardii  250 mg Oral BID   sodium chloride flush  3 mL Intravenous Q12H   tamsulosin  0.4 mg  Oral QPC supper    BMET    Component Value Date/Time   NA 132 (L) 09/02/2022 0641   NA 135 (L) 12/31/2013 1336   K 3.4 (L) 09/02/2022 0641   K 3.8 12/31/2013 1336   CL 96 (L) 09/02/2022 0641   CL 101 12/31/2013 1336   CO2 26 09/02/2022 0641   CO2 25 12/31/2013 1336   GLUCOSE 158 (H) 09/02/2022 0641   GLUCOSE 249 (H) 12/31/2013 1336   BUN 31 (H) 09/02/2022 0641   BUN 8 12/31/2013 1336   CREATININE 3.79 (H) 09/02/2022 0641   CREATININE 0.88 12/31/2013 1336   CALCIUM 8.0 (L) 09/02/2022 0641   CALCIUM 8.6 12/31/2013 1336   GFRNONAA 17 (L) 09/02/2022 0641   GFRNONAA >60 12/31/2013 1336   GFRAA >60 01/29/2018 1705   GFRAA >60 12/31/2013 1336   CBC    Component Value Date/Time   WBC 1.3 (LL) 09/02/2022 0641   RBC 2.38 (L) 09/02/2022 0641   HGB 6.8 (LL) 09/02/2022 0641   HGB 13.9 12/31/2013 1336   HCT 21.9 (L) 09/02/2022 0641   HCT 43.0 12/31/2013 1336   PLT 143 (L) 09/02/2022 0641   PLT 184 12/31/2013 1336   MCV 92.0 09/02/2022 0641   MCV 81 12/31/2013 1336   MCH 28.6 09/02/2022 0641   MCHC 31.1 09/02/2022 0641   RDW 16.8 (H) 09/02/2022 0641   RDW 14.4 12/31/2013 1336   LYMPHSABS 0.4 (L)  08/30/2022 0044   MONOABS 0.1 08/30/2022 0044   EOSABS 0.0 08/30/2022 0044   BASOSABS 0.0 08/30/2022 0044    Assessment/Plan:   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  Patient has had multiple consultations with palliative care and discussed goals of care.  It seems like it was decided to pursue dialysis as per patient interest and monitor.  Based on the current functional status, deconditioning and challenges with mobility I am worried that he may not be a good candidate for outpatient dialysis.  -s/p RIJ TDC placed by IR followed by dialysis on 6/21. HD on MWF schedule   -CLIP: [facility] MWF 12:20pm chair time.  Tolerated HD in recliner since 6/26. Still needs a lot of assistance to get out of bed (hoyer lift, 2+ assist). Will c/w HD in recliner which he fortunately has been tolerating -can plan for permanent access as an outpatient.  Seen by Dr. Karin Lieu who feels that he is too unstable for surgery at this time and will arrange for follow up in a month.     Acute urinary retention: On Flomax and Foley catheter.   Pancytopenia/anemia: hemonc consulted for further evaluation.  Iron saturation 32% with high ferritin.  On ESA. Did not tolerate BM biopsy 6/25. IR reconsulted for another attempt for bone marrow biopsy which was performed successfully on 08/30/22.  Transfuse PRN for hgb <7.  Increased aranesp dose.   Acute on chronic diastolic CHF: Treated with IV diuretics initially.  Now managing volume with HD.   HTN/volume: Continue current antihypertensives, managing volume with HD.   Hypokalemia: Dialyzing with high potassium bath.   Deconditioning - pt remains very weak and requires much assistance to get out of bed.  Will need PT/OT evaluation and ongoing SNF placement.  Severe protein malnutrition - continue with supplements.   OK for discharge from nephrology perspective.  Estill Bakes MD [facility] Pager [phone]

## 2022-09-03 NOTE — Progress Notes (Signed)
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, with features suggestive of evolving AML I told the patient the bone marrow results are suggestive of evolving AML Given his baseline poor performance (ECOG PS 4) with significant multiple co-morbidities including ESRD, CHF, decubitus ulcers, recurrent infections and others, he is not a candidate for treatment I suggest referral to hospice However, if the patient desired treatment, he would need to be transferred to tertiary center to treatment  His overall prognosis is poor; if HD or transfusion support is are discontinued, his survival would be less than 7 days Discharge planning I recommend re-engage palliative care consult team to continue to work with patient/family and re-address goals of care. Nephrologist should weigh in risks and benefits of continuing dialysis  I will sign off, call if questions arise  The total time spent in the appointment was 40 minutes encounter with patients including review of chart and various tests results, discussions about plan of care and coordination of care plan  Roy Delay, MD 09/03/2022 5:52 PM  Subjective:  He felt ok. He does not ask me any questions. 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 -See comment  PERIPHERAL BLOOD: -Pancytopenia  COMMENT:  Correlation with cytogenetic and FISH studies is recommended.  For completeness, immunohistochemical stain for CD138, and in situ hybridization for kappa and lambda will be performed and reported in an addendum.  MICROSCOPIC DESCRIPTION:  PERIPHERAL BLOOD SMEAR: The red blood cells display mild anisocytosis  with normocytic and macrocytic cells.  There is moderate poikilocytosis with elliptocytes, teardrop cells, schistocytes.  There is mild to moderate polychromasia.  The white blood cells are decreased in number with neutrophilic left shift.  Occasional a large blastic cells are seen on scan.  No Auer rods are seen.  The platelets are decreased in number.  BONE MARROW ASPIRATE: Bone marrow particles present Erythroid precursors: Progressive maturation with many late precursors displaying nuclear cytoplasmic dyssynchrony or occasional irregular nuclei. Granulocytic precursors: Left shifted maturation with increased number of blastic cells estimated at 14% of all cells.  The blasts are medium to large in size with round to irregular nuclei, fine chromatin, small to inconspicuous nucleoli, and scanty to moderately abundant cytoplasm. No Auer rods are identified.  Mature neutrophilic cells display hypogranulation and/or hypolobation or megaloblastoid changes. Megakaryocytes: Abundant with scattered large and/or hyperchromatic forms Lymphocytes/plasma cells: Large aggregates not present   Objective:  Vitals:   09/03/22 1103 09/03/22 1603  BP: (!) 150/78 (!) 161/85  Pulse: 74 76  Resp: 18 18  Temp: 99.7 F (37.6 C)   SpO2: 90% 91%     Intake/Output Summary (Last 24 hours) at 09/03/2022 1752 Last data filed at 09/03/2022 1148 Gross per 24 hour  Intake 120 ml  Output 100 ml  Net 20 ml    GENERAL:he is sleepy but rousable, talked to me with his eyes closed the entire time    Labs:  Recent Labs    08/08/22 2028 08/09/22 0532 08/30/22 0302 08/31/22 0034 09/01/22 0802 09/02/22 0641 09/03/22 0942  NA 133*   < > 133*   < > 136 132* 132*  K 3.8   < > 3.9   < >  4.3 3.4* 3.2*  CL 102   < > 97*   < > 96* 96* 94*  CO2 20*   < > 28   < > 26 26 27   GLUCOSE 131*   < > 136*   < > 130* 158* 139*  BUN 80*   < > 25*   < > 25* 31* 21  CREATININE 5.26*   < > 3.12*   < > 3.33* 3.79* 3.38*  CALCIUM 7.8*    < > 8.0*   < > 8.6* 8.0* 8.3*  GFRNONAA 12*   < > 22*   < > 20* 17* 20*  PROT 7.7  --   --   --  7.3 7.1  --   ALBUMIN 2.8*   < > 2.4*  --  2.5* 2.4*  --   AST 28  --   --   --  32 23  --   ALT 27  --   --   --  16 14  --   ALKPHOS 73  --   --   --  102 91  --   BILITOT 0.6  --   --   --  1.3* 0.7  --    < > = values in this interval not displayed.    Studies:  DG Abd Portable 1V  Result Date: 08/31/2022 CLINICAL DATA:  Abdominal pain. EXAM: PORTABLE ABDOMEN - 1 VIEW COMPARISON:  12/10/2020. FINDINGS: No bowel dilation to suggest obstruction. Soft tissues are poorly defined. No convincing renal or ureteral stones. No acute skeletal abnormality. IMPRESSION: 1. No acute findings.  No evidence of bowel obstruction. Electronically Signed   By: Amie Portland M.D.   On: 08/31/2022 13:11   CT BONE MARROW BIOPSY & ASPIRATION  Result Date: 08/30/2022 INDICATION: Pancytopenia. EXAM: CT GUIDED BONE MARROW ASPIRATION AND CORE BIOPSY MEDICATIONS: 4 mg Zofran IV ANESTHESIA/SEDATION: Moderate (conscious) sedation was employed during this procedure. A total of Versed 1 mg and Fentanyl 50 mcg was administered intravenously. Moderate Sedation Time: 13 minutes. The patient's level of consciousness and vital signs were monitored continuously by radiology nursing throughout the procedure under my direct supervision. FLUOROSCOPY TIME:  CT dose in mGy was not provided. COMPLICATIONS: None immediate. Estimated blood loss: <5 mL PROCEDURE: RADIATION DOSE REDUCTION: This exam was performed according to the departmental dose-optimization program which includes automated exposure control, adjustment of the mA and/or kV according to patient size and/or use of iterative reconstruction technique. Informed written consent was obtained from the the patient and/or patient's representative after a thorough discussion of the procedural risks, benefits and alternatives. All questions were addressed. Maximal Sterile Barrier Technique  was utilized including caps, mask, sterile gowns, sterile gloves, sterile drape, hand hygiene and skin antiseptic. A timeout was performed prior to the initiation of the procedure. The patient was positioned prone and non-contrast localization CT was performed of the pelvis to demonstrate the iliac marrow spaces. Maximal barrier sterile technique utilized including caps, mask, sterile gowns, sterile gloves, large sterile drape, hand hygiene, and chlorhexidine prep. Under sterile conditions and local anesthesia, an 11 gauge coaxial bone biopsy needle was advanced into the RIGHT iliac marrow space. Needle position was confirmed with CT imaging. Initially, bone marrow aspiration was performed. Next, the 11 gauge outer cannula was utilized to obtain a 1 iliac bone marrow core biopsy. Needle was removed. Hemostasis was obtained with compression. The patient tolerated the procedure well. Samples were prepared with the cytotechnologist. IMPRESSION: Successful CT-guided bone marrow aspiration  and biopsy. Roanna Banning, MD [facility] Electronically Signed   By: Roanna Banning M.D.   On: 08/30/2022 13:02   VAS [place] UPPER EXT VEIN MAPPING (PRE-OP AVF)  Result Date: 08/27/2022 UPPER EXTREMITY VEIN MAPPING Patient Name:  JAKYRON FABRO  Date of Exam:   08/27/2022 Medical Rec #: [id number]      Accession #:    [id number] Date of Birth: May 09, 1960       Patient Gender: M Patient Age:   9 years Exam Location:  [facility] Procedure:      VAS [place] UPPER EXT VEIN MAPPING (PRE-OP AVF) Referring Phys: EMMA COLLINS --------------------------------------------------------------------------------  Indications: Pre-access. Limitations: Patient positioning and inability to hold position for exam Comparison Study: No previous study. Performing Technologist: McKayla Maag RVT, [place]  Examination Guidelines: A complete evaluation includes B-mode imaging, spectral Doppler, color Doppler, and  power Doppler as needed of all accessible portions of each vessel. Bilateral testing is considered an integral part of a complete examination. Limited examinations for reoccurring indications may be performed as noted. +-----------------+-------------+----------+---------+ Right Cephalic   Diameter (cm)Depth (cm)Findings  +-----------------+-------------+----------+---------+ Shoulder             0.53        0.45             +-----------------+-------------+----------+---------+ Prox upper arm       0.48        0.39             +-----------------+-------------+----------+---------+ Mid upper arm        0.45        0.33             +-----------------+-------------+----------+---------+ Dist upper arm       0.50        0.37             +-----------------+-------------+----------+---------+ Antecubital fossa    0.47        0.35   branching +-----------------+-------------+----------+---------+ Prox forearm         0.41        0.49             +-----------------+-------------+----------+---------+ Mid forearm          0.37        0.43             +-----------------+-------------+----------+---------+ Dist forearm         0.33        0.32             +-----------------+-------------+----------+---------+ +-----------------+-------------+----------+---------+ Right Basilic    Diameter (cm)Depth (cm)Findings  +-----------------+-------------+----------+---------+ Mid upper arm        0.66        0.97             +-----------------+-------------+----------+---------+ Dist upper arm       0.37        0.59   branching +-----------------+-------------+----------+---------+ Antecubital fossa    0.24        0.55   branching +-----------------+-------------+----------+---------+ Prox forearm         0.27        0.38   branching +-----------------+-------------+----------+---------+ Mid forearm          0.30        0.22              +-----------------+-------------+----------+---------+ Distal forearm       0.20        0.30             +-----------------+-------------+----------+---------+ +-----------------+-------------+----------+---------+  Left Cephalic    Diameter (cm)Depth (cm)Findings  +-----------------+-------------+----------+---------+ Shoulder             0.45        0.51             +-----------------+-------------+----------+---------+ Prox upper arm       0.53        0.30             +-----------------+-------------+----------+---------+ Mid upper arm                              IV     +-----------------+-------------+----------+---------+ Dist upper arm       0.35        0.31             +-----------------+-------------+----------+---------+ Antecubital fossa    0.63        0.23   branching +-----------------+-------------+----------+---------+ Prox forearm         0.23        0.40   branching +-----------------+-------------+----------+---------+ Mid forearm          0.26        0.21             +-----------------+-------------+----------+---------+ Dist forearm         0.22        0.24             +-----------------+-------------+----------+---------+ +-----------------+-------------+----------+--------------+ Left Basilic     Diameter (cm)Depth (cm)   Findings    +-----------------+-------------+----------+--------------+ Mid upper arm        0.73        0.67                  +-----------------+-------------+----------+--------------+ Dist upper arm       0.78        0.40                  +-----------------+-------------+----------+--------------+ Antecubital fossa    0.34        0.21     branching    +-----------------+-------------+----------+--------------+ Prox forearm         0.21        0.25                  +-----------------+-------------+----------+--------------+ Mid forearm                             not visualized  +-----------------+-------------+----------+--------------+ Distal forearm                          not visualized +-----------------+-------------+----------+--------------+ *See table(s) above for measurements and observations.  Diagnosing physician: Waverly Ferrari MD Electronically signed by Waverly Ferrari MD on 08/27/2022 at 6:05:30 PM.    Final    DG Chest Port 1 View  Result Date: 08/20/2022 CLINICAL DATA:  Hypoxemia. EXAM: PORTABLE CHEST 1 VIEW COMPARISON:  Chest radiographs 08/08/2022, 07/17/2022, 03/23/2022 FINDINGS: New right internal jugular dual-lumen central venous catheter tips overlie the mid height of the right atrium. Cardiac silhouette is again moderately enlarged. Mediastinal contours within normal limits. Mildly decreased lung volumes. Mild bibasilar bronchovascular crowding. Cephalization of the pulmonary vasculature without overt pulmonary edema. Improved aeration with resolution of the prior bilateral patchy airspace opacities and interstitial thickening. No pleural effusion or pneumothorax. No acute skeletal abnormality. IMPRESSION: 1. Cardiomegaly with cephalization of the pulmonary vasculature as  can be seen with pulmonary venous hypertension. No overt pulmonary edema. 2. Improved aeration with resolution of the prior bilateral patchy airspace opacities and interstitial thickening. This appears to reflect interval improvement of the prior interstitial and alveolar pulmonary edema. Electronically Signed   By: Neita Garnet M.D.   On: 08/20/2022 18:50   IR Fluoro Guide CV Line Right  Result Date: 08/16/2022 INDICATION: End-stage renal disease. In need of durable intravenous access for the initiation hemodialysis. EXAM: TUNNELED CENTRAL VENOUS HEMODIALYSIS CATHETER PLACEMENT WITH ULTRASOUND AND FLUOROSCOPIC GUIDANCE MEDICATIONS: Patient is currently admitted to the hospital receiving intravenous antibiotics. The antibiotic was given in an appropriate time interval prior to  skin puncture. ANESTHESIA/SEDATION: Moderate (conscious) sedation was employed during this procedure. A total of Versed 1.5 mg and Fentanyl 50 mcg was administered intravenously. Moderate Sedation Time: 11 minutes. The patient's level of consciousness and vital signs were monitored continuously by radiology nursing throughout the procedure under my direct supervision. FLUOROSCOPY TIME:  36 seconds (11 mGy) COMPLICATIONS: None immediate. PROCEDURE: Informed written consent was obtained from the patient after a discussion of the risks, benefits, and alternatives to treatment. Questions regarding the procedure were encouraged and answered. The right neck and chest were prepped with chlorhexidine in a sterile fashion, and a sterile drape was applied covering the operative field. Maximum barrier sterile technique with sterile gowns and gloves were used for the procedure. A timeout was performed prior to the initiation of the procedure. After creating a small venotomy incision, a micropuncture kit was utilized to access the internal jugular vein. Real-time ultrasound guidance was utilized for vascular access including the acquisition of a permanent ultrasound image documenting patency of the accessed vessel. The microwire was utilized to measure appropriate catheter length. A stiff Glidewire was advanced to the level of the IVC and the micropuncture sheath was exchanged for a peel-away sheath. A palindrome tunneled hemodialysis catheter measuring 23 cm from tip to cuff was tunneled in a retrograde fashion from the anterior chest wall to the venotomy incision. The catheter was then placed through the peel-away sheath with tips ultimately positioned within the superior aspect of the right atrium. Final catheter positioning was confirmed and documented with a spot radiographic image. The catheter aspirates and flushes normally. The catheter was flushed with appropriate volume heparin dwells. The catheter exit site was  secured with a 0-Prolene retention suture. The venotomy incision was closed with Dermabond and Steri-strips. Dressings were applied. The patient tolerated the procedure well without immediate post procedural complication. IMPRESSION: Successful placement of 23 cm tip to cuff tunneled hemodialysis catheter via the right internal jugular vein with tips terminating within the superior aspect of the right atrium. The catheter is ready for immediate use. Electronically Signed   By: Simonne Come M.D.   On: 08/16/2022 16:37   IR US Guide Vasc Access Right  Result Date: 08/16/2022 INDICATION: End-stage renal disease. In need of durable intravenous access for the initiation hemodialysis. EXAM: TUNNELED CENTRAL VENOUS HEMODIALYSIS CATHETER PLACEMENT WITH ULTRASOUND AND FLUOROSCOPIC GUIDANCE MEDICATIONS: Patient is currently admitted to the hospital receiving intravenous antibiotics. The antibiotic was given in an appropriate time interval prior to skin puncture. ANESTHESIA/SEDATION: Moderate (conscious) sedation was employed during this procedure. A total of Versed 1.5 mg and Fentanyl 50 mcg was administered intravenously. Moderate Sedation Time: 11 minutes. The patient's level of consciousness and vital signs were monitored continuously by radiology nursing throughout the procedure under my direct supervision. FLUOROSCOPY TIME:  36 seconds (11 mGy) COMPLICATIONS: None immediate.  PROCEDURE: Informed written consent was obtained from the patient after a discussion of the risks, benefits, and alternatives to treatment. Questions regarding the procedure were encouraged and answered. The right neck and chest were prepped with chlorhexidine in a sterile fashion, and a sterile drape was applied covering the operative field. Maximum barrier sterile technique with sterile gowns and gloves were used for the procedure. A timeout was performed prior to the initiation of the procedure. After creating a small venotomy incision, a  micropuncture kit was utilized to access the internal jugular vein. Real-time ultrasound guidance was utilized for vascular access including the acquisition of a permanent ultrasound image documenting patency of the accessed vessel. The microwire was utilized to measure appropriate catheter length. A stiff Glidewire was advanced to the level of the IVC and the micropuncture sheath was exchanged for a peel-away sheath. A palindrome tunneled hemodialysis catheter measuring 23 cm from tip to cuff was tunneled in a retrograde fashion from the anterior chest wall to the venotomy incision. The catheter was then placed through the peel-away sheath with tips ultimately positioned within the superior aspect of the right atrium. Final catheter positioning was confirmed and documented with a spot radiographic image. The catheter aspirates and flushes normally. The catheter was flushed with appropriate volume heparin dwells. The catheter exit site was secured with a 0-Prolene retention suture. The venotomy incision was closed with Dermabond and Steri-strips. Dressings were applied. The patient tolerated the procedure well without immediate post procedural complication. IMPRESSION: Successful placement of 23 cm tip to cuff tunneled hemodialysis catheter via the right internal jugular vein with tips terminating within the superior aspect of the right atrium. The catheter is ready for immediate use. Electronically Signed   By: Simonne Come M.D.   On: 08/16/2022 16:37   DG Chest 2 View  Result Date: 08/08/2022 CLINICAL DATA:  [id number] with dyspnea. EXAM: CHEST - 2 VIEW COMPARISON:  Portable chest 07/17/2022, CT chest no contrast 03/17/2022 FINDINGS: The heart is moderately enlarged. Perihilar vascular congestion and edema are again noted. There is patchy airspace disease in the lung fields which could be airspace edema, pneumonia or combination. There are small layering pleural effusions. Mediastinum is normally outlined. There  are degenerative changes and mild dextroscoliosis of the thoracic spine. Similar findings were noted on both prior studies. IMPRESSION: 1. Cardiomegaly with perihilar vascular congestion and edema. 2. Patchy airspace disease in the lung fields which could be airspace edema, pneumonia or combination. 3. Small layering pleural effusions. 4. Clinical correlation and radiographic follow-up recommended. Electronically Signed   By: Almira Bar M.D.   On: 08/08/2022 21:18

## 2022-09-04 ENCOUNTER — Inpatient Hospital Stay (HOSPITAL_COMMUNITY): Payer: Medicare (Managed Care)

## 2022-09-04 DIAGNOSIS — D469 Myelodysplastic syndrome, unspecified: Secondary | ICD-10-CM | POA: Diagnosis not present

## 2022-09-04 DIAGNOSIS — N179 Acute kidney failure, unspecified: Secondary | ICD-10-CM | POA: Diagnosis not present

## 2022-09-04 DIAGNOSIS — E1169 Type 2 diabetes mellitus with other specified complication: Secondary | ICD-10-CM | POA: Diagnosis not present

## 2022-09-04 DIAGNOSIS — Z8673 Personal history of transient ischemic attack (TIA), and cerebral infarction without residual deficits: Secondary | ICD-10-CM | POA: Diagnosis not present

## 2022-09-04 HISTORY — PX: IR REMOVAL TUN CV CATH W/O FL: IMG2289

## 2022-09-04 LAB — RENAL FUNCTION PANEL
Albumin: 2.6 g/dL — ABNORMAL LOW (ref 3.5–5.0)
Anion gap: 13 (ref 5–15)
BUN: 25 mg/dL — ABNORMAL HIGH (ref 8–23)
CO2: 25 mmol/L (ref 22–32)
Calcium: 8.4 mg/dL — ABNORMAL LOW (ref 8.9–10.3)
Chloride: 93 mmol/L — ABNORMAL LOW (ref 98–111)
Creatinine, Ser: 4.11 mg/dL — ABNORMAL HIGH (ref 0.61–1.24)
GFR, Estimated: 16 mL/min — ABNORMAL LOW (ref >60)
Glucose, Bld: 136 mg/dL — ABNORMAL HIGH (ref 70–99)
Phosphorus: 3.3 mg/dL (ref 2.5–4.6)
Potassium: 3.1 mmol/L — ABNORMAL LOW (ref 3.5–5.1)
Sodium: 131 mmol/L — ABNORMAL LOW (ref 135–145)

## 2022-09-04 LAB — CBC
HCT: 26.6 % — ABNORMAL LOW (ref 39.0–52.0)
Hemoglobin: 8.6 g/dL — ABNORMAL LOW (ref 13.0–17.0)
MCH: 29.5 pg (ref 26.0–34.0)
MCHC: 32.3 g/dL (ref 30.0–36.0)
MCV: 91.1 fL (ref 80.0–100.0)
Platelets: 145 10*3/uL — ABNORMAL LOW (ref 150–400)
RBC: 2.92 MIL/uL — ABNORMAL LOW (ref 4.22–5.81)
RDW: 16.9 % — ABNORMAL HIGH (ref 11.5–15.5)
WBC: 1.5 10*3/uL — ABNORMAL LOW (ref 4.0–10.5)
nRBC: 0 % (ref 0.0–0.2)

## 2022-09-04 LAB — GLUCOSE, CAPILLARY
Glucose-Capillary: 142 mg/dL — ABNORMAL HIGH (ref 70–99)
Glucose-Capillary: 135 mg/dL — ABNORMAL HIGH (ref 70–99)
Glucose-Capillary: 134 mg/dL — ABNORMAL HIGH (ref 70–99)

## 2022-09-04 MED ORDER — HEPARIN SODIUM (PORCINE) 1000 UNIT/ML DIALYSIS: 1000.0000 [IU] | INTRAMUSCULAR | Status: DC | PRN

## 2022-09-04 MED ORDER — OXYCODONE HCL 5 MG PO TABS: 5.0000 mg | ORAL_TABLET | Freq: Four times a day (QID) | ORAL | 0 refills | Status: DC | PRN

## 2022-09-04 MED ORDER — ALTEPLASE 2 MG IJ SOLR: 2.0000 mg | Freq: Once | INTRAMUSCULAR | Status: DC | PRN

## 2022-09-04 MED ORDER — LIDOCAINE HCL 1 % IJ SOLN
INTRAMUSCULAR | Status: AC
  Filled 2022-09-04: qty 20

## 2022-09-04 MED ORDER — CARVEDILOL 25 MG PO TABS: 25.0000 mg | ORAL_TABLET | Freq: Two times a day (BID) | ORAL | 0 refills | Status: AC

## 2022-09-04 MED ORDER — ANTICOAGULANT SODIUM CITRATE 4% (200MG/5ML) IV SOLN: 5.0000 mL | Status: DC | PRN

## 2022-09-04 NOTE — Discharge Summary (Addendum)
Physician Discharge Summary   Patient: [name] MRN: [id number] DOB: 08-20-1960  Admit date:     08/08/2022  Discharge date: 09/04/22  Discharge Physician: York Ram Kia Stavros   PCP: [name], MD   Recommendations at discharge:    Patient is going back to SNF under hospice care.  Continue torsemide for volume control. Patient has a very poor prognosis.  Pain control with as needed oxycodone.   I have called his sister and nice, but unfortunately not able to reach them. [name] told me he will update his family.   Discharge Diagnoses: Principal Problem:   Myelodysplastic syndrome (HCC) Active Problems:   Acute renal failure superimposed on stage 4 chronic kidney disease (HCC)   Acute on chronic heart failure with preserved ejection fraction (HFpEF) (HCC)   Essential hypertension   History of CVA (cerebrovascular accident)   Type 2 diabetes mellitus with hyperlipidemia (HCC)   PAD (peripheral artery disease) (HCC)   Depression   Protein-calorie malnutrition, moderate (HCC)   Pressure injury of buttock, stage 2 (HCC)  Resolved Problems:   * No resolved hospital problems. [facility] Course: [name] was admitted to the hospital with the working diagnosis of symptomatic anemia.   62 yo male with the past medical history of hypertension, T2DM, CKD stage IV, peripheral vascular disease, history of CVA and chronic heart failure, who presented from SNF for evaluation of low hgb. Recent hospitalization 05/22 to 05/25 for heart failure exacerbation, and anemia with positive stool occult blood test. Required 3 units PRBC transfusion. No invasive procedure required. Follow up hgb was low and patient was referred to the ED for further evaluation. On his initial physical examination his blood pressure was 144/87, HR 69, RR 18 and 02 saturation 97%, lungs with bilateral rales with no wheezing, heart with S1 and S2 present and rhythmic, no abdominal distention and positive  left stump pitting edema.   Na 133 K 3,8 CL 102 bicarbonate 20, glucose 131 BUN 80 cr 5,26  BNP 498  High sensitive troponin 31  Iron 72, transferrin saturation 32, ferritin 628.  Wbc 1,9, hgb 6,9 plt 57   Transfused one unit PRBC with follow up hgb at 7,6  Positive urinary retention, required in and out catheterization and posteriorly foley catheter.   06/15 continue to have hypervolemia. Further drop in Hgb down to 6,8 and had another PRBC transfusion.    Worsening renal function. He had TDC placed and HD started on 6/21.  Nephrology and palliative medicine following.  Oncology consulted and ordered bone marrow biopsy.  Unfortunately, bone marrow biopsy aborted on 6/25 due to orthopnea, desaturation, nausea and emesis.    Tolerated HD in recliner 6/26. Still needs a lot of assistance to get out of bed (hoyer lift, 2+ assist).   06/29 patient with improvement in nausea and back pain. Continue very weak and deconditioned. Poor oral intake.  06/30 mentation and gastrointestinal symptoms improving. 07/01 tolerating well HD.  07/02 vascular surgery recommended fistula creation as outpatient, once comorbid conditions are more stable.  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 Unfortunately not candidate for treatment, considering his comorbid conditions and physical functional status.  Patient has decided  to continue care under hospice services.   Assessment and Plan: * Myelodysplastic syndrome (HCC) Bone morrow suggesting evolving AML.  Given his baseline poor performance (ECOG PS 4) with significant multiple co-morbidities including ESRD, CHF, decubitus ulcers, recurrent infections and  others, he is not a candidate for treatment.  Symptomatic anemia.  Recent GI evaluation, recommended conservative management.   Patient was treated with PRBC transfusion, he required a total of 4 during his hospitalization, he also received EPO.   His discharge Hgb is 8,6, wbc 1,5 and plt 145. Consistent with pancytopenia.   Acute renal failure superimposed on stage 4 chronic kidney disease (HCC) Hypokalemia. Hyponatremia.   TDC placed on 06/21.  Patient received intermittent renal replacement therapy with ultrafiltration.   At the time of his discharge his fluid balance is negative 19,993 ml with improvement in his edema.   Patient with AML, with very poor prognosis, multiple comorbid conditions and poor physical functional reserve.  Nephrology recommended continue care under hospice services.  No further dialysis, his catheter will be removed prior to his discharge.    Acute on chronic heart failure with preserved ejection fraction (HFpEF) (HCC) Echocardiogram with preserved LV systolic function. EF 50 to 55%. Moderate LVH, RV systolic function preserved. Mild aortic valve stenosis.   Blood pressure control with amlodipine, carvedilol and hydralazine.   Essential hypertension Continue blood pressure control with carvedilol, amlodipine and hydralazine.   History of CVA (cerebrovascular accident) Continue supportive medical therapy.   Type 2 diabetes mellitus with hyperlipidemia (HCC) His glucose has been stable, now with worsening renal function and low GFR. Will continue to hold on insulin therapy.   Continue with statin therapy.   PAD (peripheral artery disease) (HCC) Left BKA.   Depression Continue with PRN alprazolam.  Patient requested to discontinue sertraline due to hallucinations.   Patient with persistent pain, multifactorial.  Continue with oral oxycodone.  Protein-calorie malnutrition, moderate (HCC) Continue with nutritional supplementation.    Intermittent nausea and vomiting, will continue with PRN zofran for symptom control.  Pressure injury of buttock, stage 2 (HCC)  Active Pressure Injury/Wound(s)     Pressure Ulcer  Duration          Pressure Injury 08/22/22 Sacrum Stage 2 -  Partial thickness loss of dermis presenting as a shallow open injury with a red, pink wound bed without slough. skin tear 3 days                   Consultants: nephrology, Oncology, IR, palliative care, vascular surgery   Procedures performed: HD catheter placement, bone marrow  biopsy  Disposition: Skilled nursing facility Diet recommendation:  Cardiac and Carb modified diet DISCHARGE MEDICATION: Allergies as of 09/04/2022       Reactions   Other Other (See Comments)   Grits- reaction not listed on MAR   Sertraline Other (See Comments)   hallucinations        Medication List     STOP taking these medications    aspirin 81 MG chewable tablet   ferrous sulfate 325 (65 FE) MG tablet   Lantus SoloStar 100 UNIT/ML Solostar Pen Generic drug: insulin glargine   NovoLOG FlexPen 100 UNIT/ML FlexPen Generic drug: insulin aspart   potassium chloride SA 20 MEQ tablet Commonly known as: KLOR-CON M   pregabalin 75 MG capsule Commonly known as: LYRICA   sertraline 50 MG tablet Commonly known as: ZOLOFT       TAKE these medications    acetaminophen 650 MG CR  tablet Commonly known as: TYLENOL Take 650 mg by mouth every 4 (four) hours as needed (for discomfort- cannot exceed 3,000 mg from all combined sources/24 hours).   albuterol 108 (90 Base) MCG/ACT inhaler Commonly known as: VENTOLIN HFA Inhale 2 puffs into the lungs every 6 (six) hours as needed for wheezing or shortness of breath.   amLODipine 10 MG tablet Commonly known as: NORVASC Take 1 tablet (10 mg total) by mouth daily. What changed:  when to take this additional instructions   atorvastatin 80 MG tablet Commonly known as: LIPITOR Take 80 mg by  mouth at bedtime.   brimonidine 0.2 % ophthalmic solution Commonly known as: ALPHAGAN Place 1 drop into both eyes 2 (two) times daily.   carvedilol 25 MG tablet Commonly known as: COREG Take 1 tablet (25 mg total) by mouth 2 (two) times daily with a meal. What changed:  medication strength how much to take   docusate sodium 100 MG capsule Commonly known as: COLACE Take 100 mg by mouth at bedtime.   dorzolamide 2 % ophthalmic solution Commonly known as: TRUSOPT Place 1 drop into both eyes 2 (two) times daily.   hydrALAZINE 50 MG tablet Commonly known as: APRESOLINE Take 1 tablet (50 mg total) by mouth every 8 (eight) hours. What changed:  when to take this additional instructions   latanoprost 0.005 % ophthalmic solution Commonly known as: XALATAN Place 1 drop into both eyes at bedtime.   multivitamin tablet Take 1 tablet by mouth daily.   ondansetron 4 MG tablet Commonly known as: ZOFRAN Take 4 mg by mouth every 4 (four) hours as needed for nausea or vomiting.   oxyCODONE 5 MG immediate release tablet Commonly known as: Oxy IR/ROXICODONE Take 1 tablet (5 mg total) by mouth every 6 (six) hours as needed for severe pain.   OXYGEN Inhale 2 L/min into the lungs as needed (for sats below 90%).   pantoprazole 40 MG tablet Commonly known as: PROTONIX Take 1 tablet (40 mg total) by mouth 2 (two) times daily.   PROTEIN PO Take 30 mLs by mouth 2 (two) times daily.   PROTEIN PO Take 30 mLs by mouth 2 (two) times daily. 0900 and 2100   sodium bicarbonate 650 MG tablet Take 2 tablets (1,300 mg total) by mouth 2 (two) times daily.   tamsulosin 0.4 MG Caps capsule Commonly known as: FLOMAX Take 0.8 mg by mouth every evening.   torsemide 20 MG tablet Commonly known as: DEMADEX Take 40-80 mg by mouth See admin instructions. Take 80 mg by mouth at 8 AM and 40 mg by mouth at 7 PM        Follow-up Information     Center, [facility]. Go on 08/30/2022.   Why:  Schedule is Monday,Wednesday, Friday with 12:20 chair time.  For first appointment, please arrive at 11:20 am to complete paperwork prior to treatment. Facility will need to send a hoyer pad/sling under pt in w/c so pt can be lifted from w/c to HD chair. Contact information: [address] [place] [address] [phone]                Discharge Exam: Filed Weights   09/02/22 0500 09/03/22 0500 09/04/22 0408  Weight: 94.9 kg 94.6 kg 95 kg   BP (!) 146/76 (BP Location: Left Wrist)   Pulse 73   Temp 98.8 F (37.1 C) (Oral)   Resp 18   Ht 5\' 8"  (1.727 m)   Wt 95 kg  SpO2 95%   BMI 31.84 kg/m   Patient with no chest pain or dyspnea, his nausea is controlled with antiemetics.  Neurology awake and alert ENT with mild pallor Cardiovascular with S1 and S2 present and regular, positive systolic murmur at the right lower sternal border Respiratory with no rales or wheezing, no rhonchi Abdomen with no distention  Right lower extremity with trace to + edema, left BKA   Condition at discharge: stable  The results of significant diagnostics from this hospitalization (including imaging, microbiology, ancillary and laboratory) are listed below for reference.   Imaging Studies: DG Abd Portable 1V  Result Date: 08/31/2022 CLINICAL DATA:  Abdominal pain. EXAM: PORTABLE ABDOMEN - 1 VIEW COMPARISON:  12/10/2020. FINDINGS: No bowel dilation to suggest obstruction. Soft tissues are poorly defined. No convincing renal or ureteral stones. No acute skeletal abnormality. IMPRESSION: 1. No acute findings.  No evidence of bowel obstruction. Electronically Signed   By: Amie Portland M.D.   On: 08/31/2022 13:11   CT BONE MARROW BIOPSY & ASPIRATION  Result Date: 08/30/2022 INDICATION: Pancytopenia. EXAM: CT GUIDED BONE MARROW ASPIRATION AND CORE BIOPSY MEDICATIONS: 4 mg Zofran IV ANESTHESIA/SEDATION: Moderate (conscious) sedation was employed during this procedure. A total of Versed 1 mg and  Fentanyl 50 mcg was administered intravenously. Moderate Sedation Time: 13 minutes. The patient's level of consciousness and vital signs were monitored continuously by radiology nursing throughout the procedure under my direct supervision. FLUOROSCOPY TIME:  CT dose in mGy was not provided. COMPLICATIONS: None immediate. Estimated blood loss: <5 mL PROCEDURE: RADIATION DOSE REDUCTION: This exam was performed according to the departmental dose-optimization program which includes automated exposure control, adjustment of the mA and/or kV according to patient size and/or use of iterative reconstruction technique. Informed written consent was obtained from the the patient and/or patient's representative after a thorough discussion of the procedural risks, benefits and alternatives. All questions were addressed. Maximal Sterile Barrier Technique was utilized including caps, mask, sterile gowns, sterile gloves, sterile drape, hand hygiene and skin antiseptic. A timeout was performed prior to the initiation of the procedure. The patient was positioned prone and non-contrast localization CT was performed of the pelvis to demonstrate the iliac marrow spaces. Maximal barrier sterile technique utilized including caps, mask, sterile gowns, sterile gloves, large sterile drape, hand hygiene, and chlorhexidine prep. Under sterile conditions and local anesthesia, an 11 gauge coaxial bone biopsy needle was advanced into the RIGHT iliac marrow space. Needle position was confirmed with CT imaging. Initially, bone marrow aspiration was performed. Next, the 11 gauge outer cannula was utilized to obtain a 1 iliac bone marrow core biopsy. Needle was removed. Hemostasis was obtained with compression. The patient tolerated the procedure well. Samples were prepared with the cytotechnologist. IMPRESSION: Successful CT-guided bone marrow aspiration and biopsy. Roanna Banning, MD [facility]  Radiology Electronically Signed   By: Roanna Banning M.D.   On: 08/30/2022 13:02   VAS [place] UPPER EXT VEIN MAPPING (PRE-OP AVF)  Result Date: 08/27/2022 UPPER EXTREMITY VEIN MAPPING Patient Name:  BRYSON GAVIA  Date of Exam:   08/27/2022 Medical Rec #: [id number]      Accession #:    [id number] Date of Birth: 02/12/1961       Patient Gender: M Patient Age:   7 years Exam Location:  [facility] Procedure:      VAS [place] UPPER EXT VEIN MAPPING (PRE-OP AVF) Referring Phys: EMMA COLLINS --------------------------------------------------------------------------------  Indications: Pre-access. Limitations: Patient positioning and inability  to hold position for exam Comparison Study: No previous study. Performing Technologist: McKayla Maag RVT, [place]  Examination Guidelines: A complete evaluation includes B-mode imaging, spectral Doppler, color Doppler, and power Doppler as needed of all accessible portions of each vessel. Bilateral testing is considered an integral part of a complete examination. Limited examinations for reoccurring indications may be performed as noted. +-----------------+-------------+----------+---------+ Right Cephalic   Diameter (cm)Depth (cm)Findings  +-----------------+-------------+----------+---------+ Shoulder             0.53        0.45             +-----------------+-------------+----------+---------+ Prox upper arm       0.48        0.39             +-----------------+-------------+----------+---------+ Mid upper arm        0.45        0.33             +-----------------+-------------+----------+---------+ Dist upper arm       0.50        0.37             +-----------------+-------------+----------+---------+ Antecubital fossa    0.47        0.35   branching +-----------------+-------------+----------+---------+ Prox forearm         0.41        0.49             +-----------------+-------------+----------+---------+ Mid forearm          0.37        0.43              +-----------------+-------------+----------+---------+ Dist forearm         0.33        0.32             +-----------------+-------------+----------+---------+ +-----------------+-------------+----------+---------+ Right Basilic    Diameter (cm)Depth (cm)Findings  +-----------------+-------------+----------+---------+ Mid upper arm        0.66        0.97             +-----------------+-------------+----------+---------+ Dist upper arm       0.37        0.59   branching +-----------------+-------------+----------+---------+ Antecubital fossa    0.24        0.55   branching +-----------------+-------------+----------+---------+ Prox forearm         0.27        0.38   branching +-----------------+-------------+----------+---------+ Mid forearm          0.30        0.22             +-----------------+-------------+----------+---------+ Distal forearm       0.20        0.30             +-----------------+-------------+----------+---------+ +-----------------+-------------+----------+---------+ Left Cephalic    Diameter (cm)Depth (cm)Findings  +-----------------+-------------+----------+---------+ Shoulder             0.45        0.51             +-----------------+-------------+----------+---------+ Prox upper arm       0.53        0.30             +-----------------+-------------+----------+---------+ Mid upper arm                              IV     +-----------------+-------------+----------+---------+  Dist upper arm       0.35        0.31             +-----------------+-------------+----------+---------+ Antecubital fossa    0.63        0.23   branching +-----------------+-------------+----------+---------+ Prox forearm         0.23        0.40   branching +-----------------+-------------+----------+---------+ Mid forearm          0.26        0.21             +-----------------+-------------+----------+---------+ Dist forearm          0.22        0.24             +-----------------+-------------+----------+---------+ +-----------------+-------------+----------+--------------+ Left Basilic     Diameter (cm)Depth (cm)   Findings    +-----------------+-------------+----------+--------------+ Mid upper arm        0.73        0.67                  +-----------------+-------------+----------+--------------+ Dist upper arm       0.78        0.40                  +-----------------+-------------+----------+--------------+ Antecubital fossa    0.34        0.21     branching    +-----------------+-------------+----------+--------------+ Prox forearm         0.21        0.25                  +-----------------+-------------+----------+--------------+ Mid forearm                             not visualized +-----------------+-------------+----------+--------------+ Distal forearm                          not visualized +-----------------+-------------+----------+--------------+ *See table(s) above for measurements and observations.  Diagnosing physician: Waverly Ferrari MD Electronically signed by Waverly Ferrari MD on 08/27/2022 at 6:05:30 PM.    Final    DG Chest Port 1 View  Result Date: 08/20/2022 CLINICAL DATA:  Hypoxemia. EXAM: PORTABLE CHEST 1 VIEW COMPARISON:  Chest radiographs 08/08/2022, 07/17/2022, 03/23/2022 FINDINGS: New right internal jugular dual-lumen central venous catheter tips overlie the mid height of the right atrium. Cardiac silhouette is again moderately enlarged. Mediastinal contours within normal limits. Mildly decreased lung volumes. Mild bibasilar bronchovascular crowding. Cephalization of the pulmonary vasculature without overt pulmonary edema. Improved aeration with resolution of the prior bilateral patchy airspace opacities and interstitial thickening. No pleural effusion or pneumothorax. No acute skeletal abnormality. IMPRESSION: 1. Cardiomegaly with cephalization of the  pulmonary vasculature as can be seen with pulmonary venous hypertension. No overt pulmonary edema. 2. Improved aeration with resolution of the prior bilateral patchy airspace opacities and interstitial thickening. This appears to reflect interval improvement of the prior interstitial and alveolar pulmonary edema. Electronically Signed   By: Neita Garnet M.D.   On: 08/20/2022 18:50   IR Fluoro Guide CV Line Right  Result Date: 08/16/2022 INDICATION: End-stage renal disease. In need of durable intravenous access for the initiation hemodialysis. EXAM: TUNNELED CENTRAL VENOUS HEMODIALYSIS CATHETER PLACEMENT WITH ULTRASOUND AND FLUOROSCOPIC GUIDANCE MEDICATIONS: Patient is currently admitted to the hospital receiving intravenous antibiotics. The antibiotic was given in an appropriate time interval prior  to skin puncture. ANESTHESIA/SEDATION: Moderate (conscious) sedation was employed during this procedure. A total of Versed 1.5 mg and Fentanyl 50 mcg was administered intravenously. Moderate Sedation Time: 11 minutes. The patient's level of consciousness and vital signs were monitored continuously by radiology nursing throughout the procedure under my direct supervision. FLUOROSCOPY TIME:  36 seconds (11 mGy) COMPLICATIONS: None immediate. PROCEDURE: Informed written consent was obtained from the patient after a discussion of the risks, benefits, and alternatives to treatment. Questions regarding the procedure were encouraged and answered. The right neck and chest were prepped with chlorhexidine in a sterile fashion, and a sterile drape was applied covering the operative field. Maximum barrier sterile technique with sterile gowns and gloves were used for the procedure. A timeout was performed prior to the initiation of the procedure. After creating a small venotomy incision, a micropuncture kit was utilized to access the internal jugular vein. Real-time ultrasound guidance was utilized for vascular access including  the acquisition of a permanent ultrasound image documenting patency of the accessed vessel. The microwire was utilized to measure appropriate catheter length. A stiff Glidewire was advanced to the level of the IVC and the micropuncture sheath was exchanged for a peel-away sheath. A palindrome tunneled hemodialysis catheter measuring 23 cm from tip to cuff was tunneled in a retrograde fashion from the anterior chest wall to the venotomy incision. The catheter was then placed through the peel-away sheath with tips ultimately positioned within the superior aspect of the right atrium. Final catheter positioning was confirmed and documented with a spot radiographic image. The catheter aspirates and flushes normally. The catheter was flushed with appropriate volume heparin dwells. The catheter exit site was secured with a 0-Prolene retention suture. The venotomy incision was closed with Dermabond and Steri-strips. Dressings were applied. The patient tolerated the procedure well without immediate post procedural complication. IMPRESSION: Successful placement of 23 cm tip to cuff tunneled hemodialysis catheter via the right internal jugular vein with tips terminating within the superior aspect of the right atrium. The catheter is ready for immediate use. Electronically Signed   By: Simonne Come M.D.   On: 08/16/2022 16:37   IR US Guide Vasc Access Right  Result Date: 08/16/2022 INDICATION: End-stage renal disease. In need of durable intravenous access for the initiation hemodialysis. EXAM: TUNNELED CENTRAL VENOUS HEMODIALYSIS CATHETER PLACEMENT WITH ULTRASOUND AND FLUOROSCOPIC GUIDANCE MEDICATIONS: Patient is currently admitted to the hospital receiving intravenous antibiotics. The antibiotic was given in an appropriate time interval prior to skin puncture. ANESTHESIA/SEDATION: Moderate (conscious) sedation was employed during this procedure. A total of Versed 1.5 mg and Fentanyl 50 mcg was administered intravenously.  Moderate Sedation Time: 11 minutes. The patient's level of consciousness and vital signs were monitored continuously by radiology nursing throughout the procedure under my direct supervision. FLUOROSCOPY TIME:  36 seconds (11 mGy) COMPLICATIONS: None immediate. PROCEDURE: Informed written consent was obtained from the patient after a discussion of the risks, benefits, and alternatives to treatment. Questions regarding the procedure were encouraged and answered. The right neck and chest were prepped with chlorhexidine in a sterile fashion, and a sterile drape was applied covering the operative field. Maximum barrier sterile technique with sterile gowns and gloves were used for the procedure. A timeout was performed prior to the initiation of the procedure. After creating a small venotomy incision, a micropuncture kit was utilized to access the internal jugular vein. Real-time ultrasound guidance was utilized for vascular access including the acquisition of a permanent ultrasound image documenting patency of the  accessed vessel. The microwire was utilized to measure appropriate catheter length. A stiff Glidewire was advanced to the level of the IVC and the micropuncture sheath was exchanged for a peel-away sheath. A palindrome tunneled hemodialysis catheter measuring 23 cm from tip to cuff was tunneled in a retrograde fashion from the anterior chest wall to the venotomy incision. The catheter was then placed through the peel-away sheath with tips ultimately positioned within the superior aspect of the right atrium. Final catheter positioning was confirmed and documented with a spot radiographic image. The catheter aspirates and flushes normally. The catheter was flushed with appropriate volume heparin dwells. The catheter exit site was secured with a 0-Prolene retention suture. The venotomy incision was closed with Dermabond and Steri-strips. Dressings were applied. The patient tolerated the procedure well without  immediate post procedural complication. IMPRESSION: Successful placement of 23 cm tip to cuff tunneled hemodialysis catheter via the right internal jugular vein with tips terminating within the superior aspect of the right atrium. The catheter is ready for immediate use. Electronically Signed   By: Simonne Come M.D.   On: 08/16/2022 16:37   DG Chest 2 View  Result Date: 08/08/2022 CLINICAL DATA:  [id number] with dyspnea. EXAM: CHEST - 2 VIEW COMPARISON:  Portable chest 07/17/2022, CT chest no contrast 03/17/2022 FINDINGS: The heart is moderately enlarged. Perihilar vascular congestion and edema are again noted. There is patchy airspace disease in the lung fields which could be airspace edema, pneumonia or combination. There are small layering pleural effusions. Mediastinum is normally outlined. There are degenerative changes and mild dextroscoliosis of the thoracic spine. Similar findings were noted on both prior studies. IMPRESSION: 1. Cardiomegaly with perihilar vascular congestion and edema. 2. Patchy airspace disease in the lung fields which could be airspace edema, pneumonia or combination. 3. Small layering pleural effusions. 4. Clinical correlation and radiographic follow-up recommended. Electronically Signed   By: Almira Bar M.D.   On: 08/08/2022 21:18    Microbiology: Results for orders placed or performed during the hospital encounter of 07/17/22  Blood culture (routine x 2)     Status: None   Collection Time: 07/17/22  6:15 PM   Specimen: BLOOD  Result Value Ref Range Status   Specimen Description   Final    BLOOD LEFT ANTECUBITAL Performed at [facility], [address], [place], [place] [address]    Special Requests   Final    BOTTLES DRAWN AEROBIC AND ANAEROBIC Blood Culture results may not be optimal due to an excessive volume of blood received in culture bottles Performed at [facility], [address], [place], [place] [address]    Culture    Final    NO GROWTH 5 DAYS Performed at [facility], [address] [place], [place] [address]    Report Status 07/22/2022 FINAL  Final  Blood culture (routine x 2)     Status: None   Collection Time: 07/17/22  7:12 PM   Specimen: BLOOD  Result Value Ref Range Status   Specimen Description   Final    BLOOD SITE NOT SPECIFIED Performed at [facility], [address], [place], [place] [address]    Special Requests   Final    BOTTLES DRAWN AEROBIC AND ANAEROBIC Blood Culture results may not be optimal due to an inadequate volume of blood received in culture bottles Performed at [facility], [address] [place], [place] [address]    Culture  Final    NO GROWTH 5 DAYS Performed at [facility], [address], [place], [place] [address]    Report Status 07/22/2022 FINAL  Final  MRSA Next Gen by PCR, Nasal     Status: Abnormal   Collection Time: 07/18/22  7:01 AM   Specimen: Nasal Mucosa; Nasal Swab  Result Value Ref Range Status   MRSA by PCR Next Gen DETECTED (A) NOT DETECTED Final    Comment: (NOTE) The GeneXpert MRSA Assay (FDA approved for NASAL specimens only), is one component of a comprehensive MRSA colonization surveillance program. It is not intended to diagnose MRSA infection nor to guide or monitor treatment for MRSA infections. Test performance is not FDA approved in patients less than 37 years old. Performed at [facility], [address] [place], [place] [address]     Labs: CBC: Recent Labs  Lab 08/30/22 0044 08/31/22 0034 09/02/22 0641 09/03/22 0942 09/04/22 0658  WBC 1.1* 1.2* 1.3* 1.6* 1.5*  NEUTROABS 0.4*  --   --   --   --   HGB 7.4* 7.5* 6.8* 8.4* 8.6*  HCT 23.4* 23.8* 21.9* 26.7* 26.6*  MCV 91.4 90.8 92.0 89.9 91.1  PLT 124* 133* 143* 148* 145*   Basic Metabolic Panel: Recent Labs  Lab 08/30/22 0302 08/31/22 0034 09/01/22 0802 09/02/22 0641 09/03/22 0942  09/04/22 0658  NA 133* 135 136 132* 132* 131*  K 3.9 3.9 4.3 3.4* 3.2* 3.1*  CL 97* 98 96* 96* 94* 93*  CO2 28 29 26 26 27 25   GLUCOSE 136* 126* 130* 158* 139* 136*  BUN 25* 15 25* 31* 21 25*  CREATININE 3.12* 2.35* 3.33* 3.79* 3.38* 4.11*  CALCIUM 8.0* 8.3* 8.6* 8.0* 8.3* 8.4*  PHOS 3.3  --   --  3.5  --  3.3   Liver Function Tests: Recent Labs  Lab 08/30/22 0302 09/01/22 0802 09/02/22 0641 09/04/22 0658  AST  --  32 23  --   ALT  --  16 14  --   ALKPHOS  --  102 91  --   BILITOT  --  1.3* 0.7  --   PROT  --  7.3 7.1  --   ALBUMIN 2.4* 2.5* 2.4* 2.6*   CBG: Recent Labs  Lab 09/03/22 1059 09/03/22 1557 09/03/22 2124 09/04/22 0601 09/04/22 1058  GLUCAP 142* 123* 156* 142* 135*    Discharge time spent: greater than 30 minutes.  Signed: Coralie Keens, MD [facility] 09/04/2022

## 2022-09-04 NOTE — Progress Notes (Signed)
     Brief progress note:   Chart reviewed. Initial consult completed 08/13/22. Patient was last seen by PMT on 08/18/22.   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  Oncology has recommended hospice.  Note plan for patient to return to SNF with hospice services through [facility]. Pending discharge today.  Per nephrology, plan is to discontinue dialysis.   Sherlean Foot, NP-C Palliative Medicine   Please call Palliative Medicine team phone with any questions [phone]. For individual providers please see AMION.   No charge

## 2022-09-04 NOTE — Progress Notes (Signed)
Patient stating he will take his medications when he get his tray.

## 2022-09-04 NOTE — Procedures (Signed)
PROCEDURE SUMMARY:  Successful removal of tunneled hemodialysis catheter.  Patient tolerated well.  EBL < 5 mL  See full dictation in Imaging for details.  Jerry Caras Tajuanna Burnett PA-C 09/04/2022 2:39 PM

## 2022-09-04 NOTE — Progress Notes (Signed)
Patient requesting to take medication with his breakfast- patient just ordered. Awaiting for it to arrive at bedside.

## 2022-09-04 NOTE — Progress Notes (Signed)
Patient ID: [name], male   DOB: [birth year], 62 y.o.   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  Onc rec hospice.  Discussed with patient today d/c dialysis.  He acknowledged but did not conversate. Said no questions when asked directly.   O:BP (!) 149/73 (BP Location: Left Wrist)   Pulse 73   Temp 98.9 F (37.2 C) (Oral)   Resp 17   Ht 5\' 8"  (1.727 m)   Wt 95 kg   SpO2 93%   BMI 31.84 kg/m   Intake/Output Summary (Last 24 hours) at 09/04/2022 0925 Last data filed at 09/04/2022 0600 Gross per 24 hour  Intake 537 ml  Output 150 ml  Net 387 ml    Gen: chronically ill-appearing, NAD CVS: RRR Resp:CTA Abd: +BS, soft, NT/ND Ext: chronic woody trace edema on RLE, s/p LBKA  Recent Labs  Lab 08/30/22 0302 08/31/22 0034 09/01/22 0802 09/02/22 0641 09/03/22 0942 09/04/22 0658  NA 133* 135 136 132* 132* 131*  K 3.9 3.9 4.3 3.4* 3.2* 3.1*  CL 97* 98 96* 96* 94* 93*  CO2 28 29 26 26 27 25   GLUCOSE 136* 126* 130* 158* 139* 136*  BUN 25* 15 25* 31* 21 25*  CREATININE 3.12* 2.35* 3.33* 3.79* 3.38* 4.11*  ALBUMIN 2.4*  --  2.5* 2.4*  --  2.6*  CALCIUM 8.0* 8.3* 8.6* 8.0* 8.3* 8.4*  PHOS 3.3  --   --  3.5  --  3.3  AST  --   --  32 23  --   --   ALT  --   --  16 14  --   --     Liver Function Tests: Recent Labs  Lab 09/01/22 0802 09/02/22 0641 09/04/22 0658  AST 32 23  --   ALT 16 14  --   ALKPHOS 102 91  --   BILITOT 1.3* 0.7  --   PROT 7.3 7.1  --   ALBUMIN 2.5* 2.4* 2.6*    No results for input(s): "LIPASE", "AMYLASE" in the last 168 hours. No results for input(s): "AMMONIA" in the last 168 hours. CBC: Recent Labs  Lab 08/30/22 0044 08/31/22 0034 09/02/22 0641 09/03/22 0942 09/04/22 0658  WBC 1.1* 1.2* 1.3* 1.6* 1.5*  NEUTROABS 0.4*  --   --   --   --   HGB 7.4* 7.5* 6.8* 8.4* 8.6*  HCT 23.4* 23.8* 21.9* 26.7* 26.6*  MCV 91.4 90.8 92.0 89.9 91.1  PLT 124* 133* 143* 148* 145*    Cardiac Enzymes: No  results for input(s): "CKTOTAL", "CKMB", "CKMBINDEX", "TROPONINI" in the last 168 hours. CBG: Recent Labs  Lab 09/03/22 0601 09/03/22 1059 09/03/22 1557 09/03/22 2124 09/04/22 0601  GLUCAP 113* 142* 123* 156* 142*     Iron Studies: No results for input(s): "IRON", "TIBC", "TRANSFERRIN", "FERRITIN" in the last 72 hours. Studies/Results: No results found.  amLODipine  10 mg Oral Daily   atorvastatin  80 mg Oral QHS   carvedilol  25 mg Oral BID WC   Chlorhexidine Gluconate Cloth  6 each Topical Q0600   darbepoetin (ARANESP) injection - NON-DIALYSIS  200 mcg Subcutaneous Q Wed-1800   hydrALAZINE  50 mg Oral 4 times per day   insulin aspart  0-5 Units Subcutaneous QHS   insulin aspart  0-6 Units Subcutaneous TID WC   multivitamin  1 tablet Oral QHS   mouth rinse  15 mL Mouth Rinse 4 times per day   pantoprazole  40 mg Oral BID   polyethylene glycol  17 g Oral BID   saccharomyces boulardii  250 mg Oral BID   sodium chloride flush  3 mL Intravenous Q12H   tamsulosin  0.4 mg Oral QPC supper    BMET    Component Value Date/Time   NA 131 (L) 09/04/2022 0658   NA 135 (L) 12/31/2013 1336   K 3.1 (L) 09/04/2022 0658   K 3.8 12/31/2013 1336   CL 93 (L) 09/04/2022 0658   CL 101 12/31/2013 1336   CO2 25 09/04/2022 0658   CO2 25 12/31/2013 1336   GLUCOSE 136 (H) 09/04/2022 0658   GLUCOSE 249 (H) 12/31/2013 1336   BUN 25 (H) 09/04/2022 0658   BUN 8 12/31/2013 1336   CREATININE 4.11 (H) 09/04/2022 0658   CREATININE 0.88 12/31/2013 1336   CALCIUM 8.4 (L) 09/04/2022 0658   CALCIUM 8.6 12/31/2013 1336   GFRNONAA 16 (L) 09/04/2022 0658   GFRNONAA >60 12/31/2013 1336   GFRAA >60 01/29/2018 1705   GFRAA >60 12/31/2013 1336   CBC    Component Value Date/Time   WBC 1.5 (L) 09/04/2022 0658   RBC 2.92 (L) 09/04/2022 0658   HGB 8.6 (L) 09/04/2022 0658   HGB 13.9 12/31/2013 1336   HCT 26.6 (L) 09/04/2022 0658   HCT 43.0 12/31/2013 1336   PLT 145 (L) 09/04/2022 0658   PLT 184  12/31/2013 1336   MCV 91.1 09/04/2022 0658   MCV 81 12/31/2013 1336   MCH 29.5 09/04/2022 0658   MCHC 32.3 09/04/2022 0658   RDW 16.9 (H) 09/04/2022 0658   RDW 14.4 12/31/2013 1336   LYMPHSABS 0.4 (L) 08/30/2022 0044   MONOABS 0.1 08/30/2022 0044   EOSABS 0.0 08/30/2022 0044   BASOSABS 0.0 08/30/2022 0044    Assessment/Plan:   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  Patient has had multiple consultations with palliative care and discussed goals of care.  It seems like it was decided to pursue dialysis as per patient interest and monitor. He had dialysis arranged for outpt but with the new diagnosis of untreatable leukemia have recommended stopping dialysis and pursuing hospice.  He acknowledged and denied any questions.  No further dialysis, will have renal navigator inform his outpt unit.       Acute urinary retention: On Flomax and Foley catheter.   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  No treatment possible --> pall care recommended   Acute on chronic diastolic CHF: euvolemic after UF with HD   HTN/volume: Continue current antihypertensives, managing volume with HD.   Hypokalemia: Dialyzing with high potassium bath.   Dispo:  palliative care needs to be re involved.  Untreatable leukemia.  Stop dialysis.   Nothing further to add, call me if I can further assist.     Estill Bakes MD [facility] Pager [phone]

## 2022-09-04 NOTE — Progress Notes (Addendum)
Contacted by nephrologist regarding plans to stop HD. Pt's referral to [facility] will be cancelled.    Olivia Canter Renal Navigator [phone]

## 2022-09-04 NOTE — TOC Progression Note (Addendum)
Transition of Care [facility]) - Progression Note    Patient Details  Name: KURTIS ANASTASIA MRN: [id number] Date of Birth: 1960-06-03  Transition [facility] CM/SW Contact  Delilah Shan, LCSWA Phone Number: 09/04/2022, 11:38 AM  Clinical Narrative:      CSW informed by MD that patient request hospice services at SNF. CSW spoke with patient who confirmed he would like for [facility] hospice services to follow him at [facility]. CSW spoke with Vangie Bicker with [facility] who confirmed they will follow patient at [facility]. CSW informed Shanita with [facility].CSW spoke with Whitney with [facility] who informed CSW no FL2 needed for patient to return and that patient can return today with hospice services to follow if medically ready for dc. CSW informed MD. CSW will continue to follow and assist with patients dc planning needs.      Expected Discharge Plan and Services                                               Social Determinants of Health (SDOH) Interventions SDOH Screenings   Food Insecurity: No Food Insecurity (08/30/2022)  Housing: Low Risk  (08/30/2022)  Transportation Needs: No Transportation Needs (08/30/2022)  Utilities: Not At Risk (08/30/2022)  Alcohol Screen: Low Risk  (07/30/2017)  Depression (PHQ2-9): Low Risk  (07/10/2020)  Tobacco Use: Medium Risk (08/19/2022)    Readmission Risk Interventions    07/18/2022    3:54 PM 04/02/2022    3:21 PM 03/20/2022   12:20 PM  Readmission Risk Prevention Plan  Transportation Screening Complete Complete Complete  Medication Review Oceanographer) Complete Complete Complete  PCP or Specialist appointment within 3-5 days of discharge  Complete Complete  HRI or Home Care Consult  Complete Complete  SW Recovery Care/Counseling Consult Complete Complete Complete  Palliative Care Screening Not Applicable Not Applicable Not Applicable  Skilled Nursing Facility Complete Complete Complete

## 2022-09-04 NOTE — TOC Transition Note (Addendum)
Transition of Care [facility]) - CM/SW Discharge Note   Patient Details  Name: [name] MRN: [id number] Date of Birth: 09/29/1960  [facility]) CM/SW Contact:  Delilah Shan, LCSWA Phone Number: 09/04/2022, 3:05 PM   Clinical Narrative:     Patient will DC to: [facility]  Anticipated DC date: 09/04/2022  Family notified : patient gave permission for CSW to inform Dominque and Tiwanna of dc back to SNF.  Transport by: Sharin Mons  ?  Per MD patient ready for DC to  [facility] with hospice services to follow. RN, patient, patient's family, Candise Bowens with [place] facility notified of DC. Discharge Summary sent to facility. RN given number for report [phone] RM# [id number]. DC packet on chart. DNR signed by MD attached to patients DC packet. Ambulance transport requested for patient.  CSW signing off.   Final next level of care: Long Term Nursing Home Barriers to Discharge: No Barriers Identified   Patient Goals and CMS Choice   Choice offered to / list presented to : Patient  Discharge Placement                Patient chooses bed at:  [facility]) Patient to be transferred to facility by: [facility]   Patient and family notified of of transfer: 09/04/22  Discharge Plan and Services Additional resources added to the After Visit Summary for                                       Social Determinants of Health (SDOH) Interventions SDOH Screenings   Food Insecurity: No Food Insecurity (08/30/2022)  Housing: Low Risk  (08/30/2022)  Transportation Needs: No Transportation Needs (08/30/2022)  Utilities: Not At Risk (08/30/2022)  Alcohol Screen: Low Risk  (07/30/2017)  Depression (PHQ2-9): Low Risk  (07/10/2020)  Tobacco Use: Medium Risk (08/19/2022)     Readmission Risk Interventions    07/18/2022    3:54 PM 04/02/2022    3:21 PM 03/20/2022   12:20 PM  Readmission Risk Prevention Plan  Transportation Screening Complete Complete Complete  Medication Review  Oceanographer) Complete Complete Complete  PCP or Specialist appointment within 3-5 days of discharge  Complete Complete  HRI or Home Care Consult  Complete Complete  SW Recovery Care/Counseling Consult Complete Complete Complete  Palliative Care Screening Not Applicable Not Applicable Not Applicable  Skilled Nursing Facility Complete Complete Complete

## 2022-09-06 ENCOUNTER — Encounter (HOSPITAL_COMMUNITY): Payer: Self-pay | Admitting: Internal Medicine

## 2022-09-06 ENCOUNTER — Telehealth: Payer: Self-pay | Admitting: Vascular Surgery

## 2022-09-06 NOTE — Telephone Encounter (Signed)
-----   Message from [name] sent at 08/27/2022 10:29 AM EDT ----- Please book one month follow up with me in one month for fistula creation  Dx ESRD

## 2022-09-09 ENCOUNTER — Encounter (HOSPITAL_COMMUNITY): Payer: Self-pay | Admitting: Internal Medicine

## 2022-09-09 ENCOUNTER — Encounter (HOSPITAL_COMMUNITY): Payer: Self-pay

## 2022-10-13 IMAGING — DX DG SHOULDER 2+V*R*
3 series · 3 of 3 positions shown · non-contrast
Comparison: 11/15/2020

CLINICAL DATA: Pain

EXAM:
RIGHT SHOULDER - 2+ VIEW

[shoulder ap]
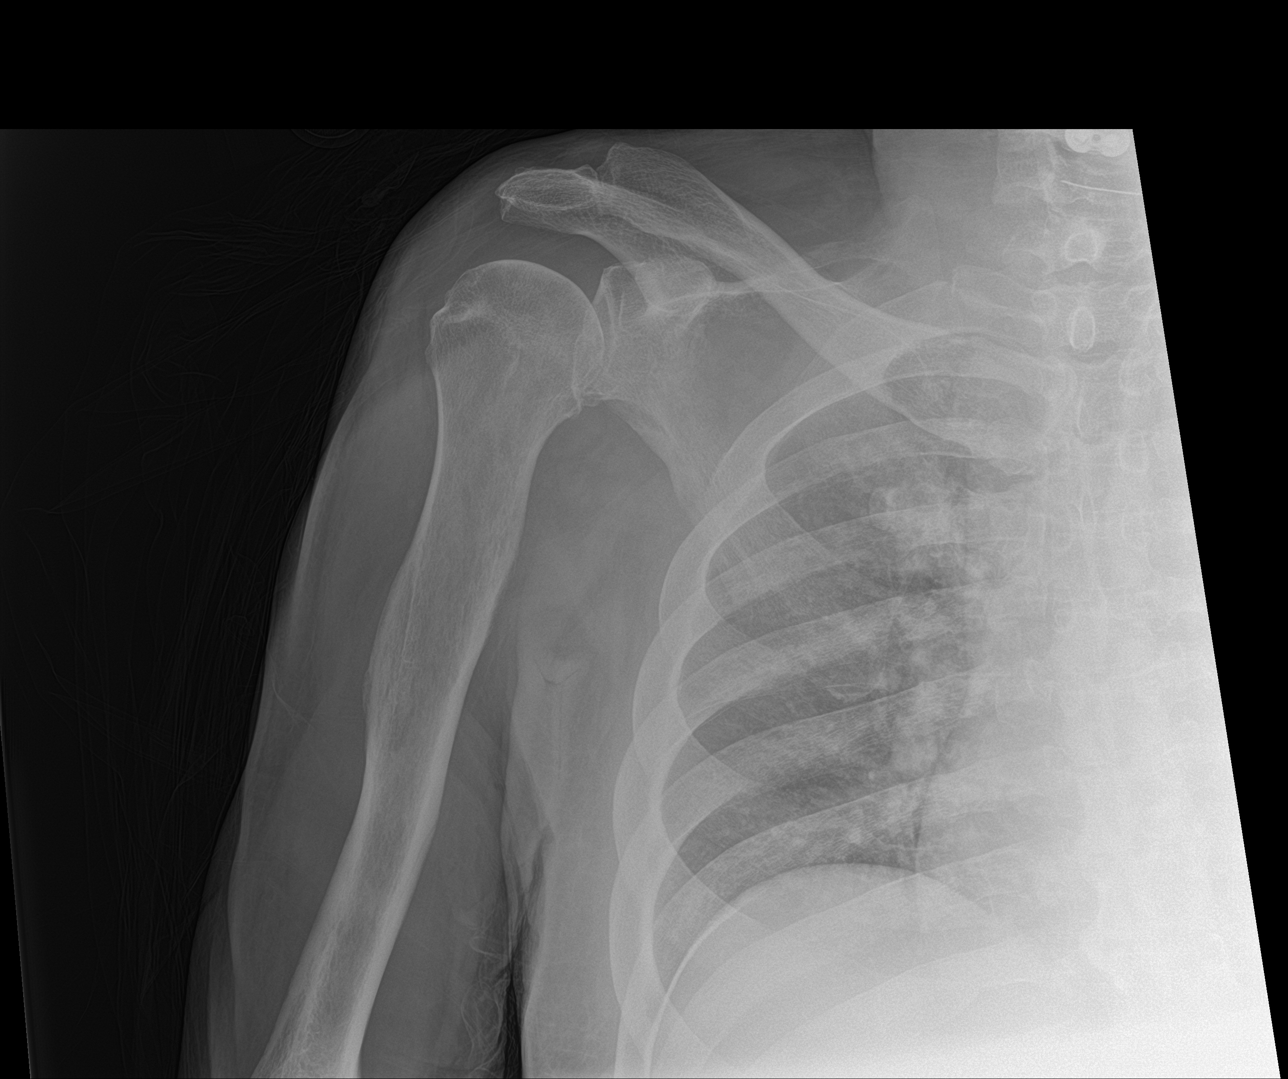

[shoulder obl (1 of 2)]
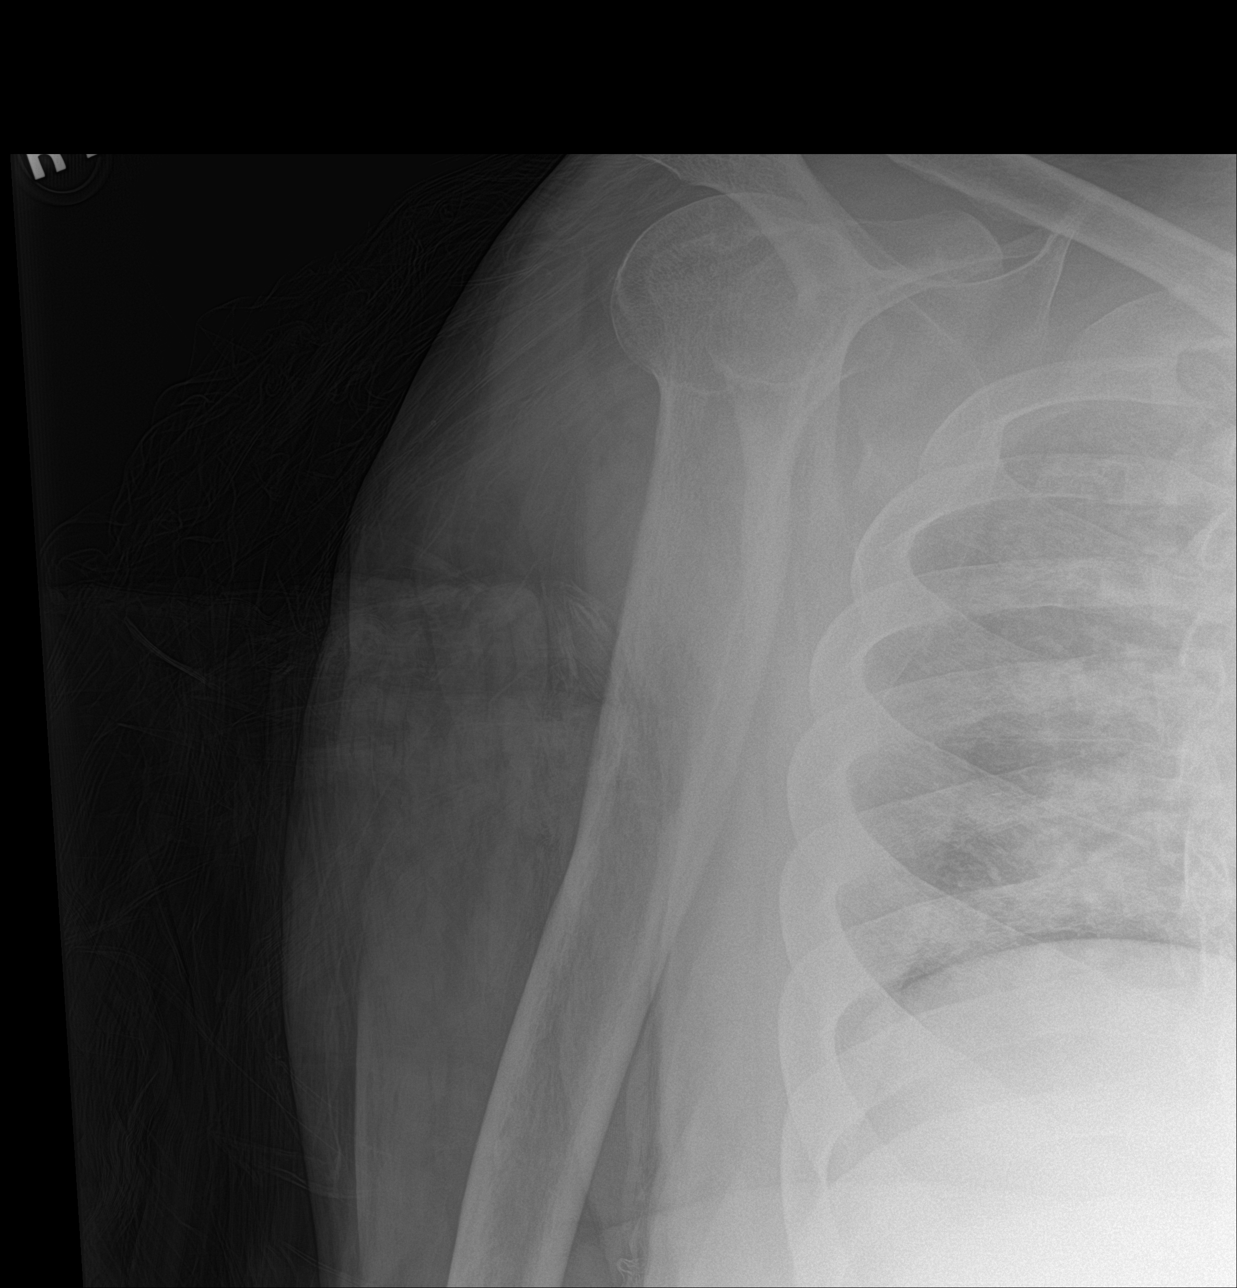

[shoulder obl (2 of 2)]
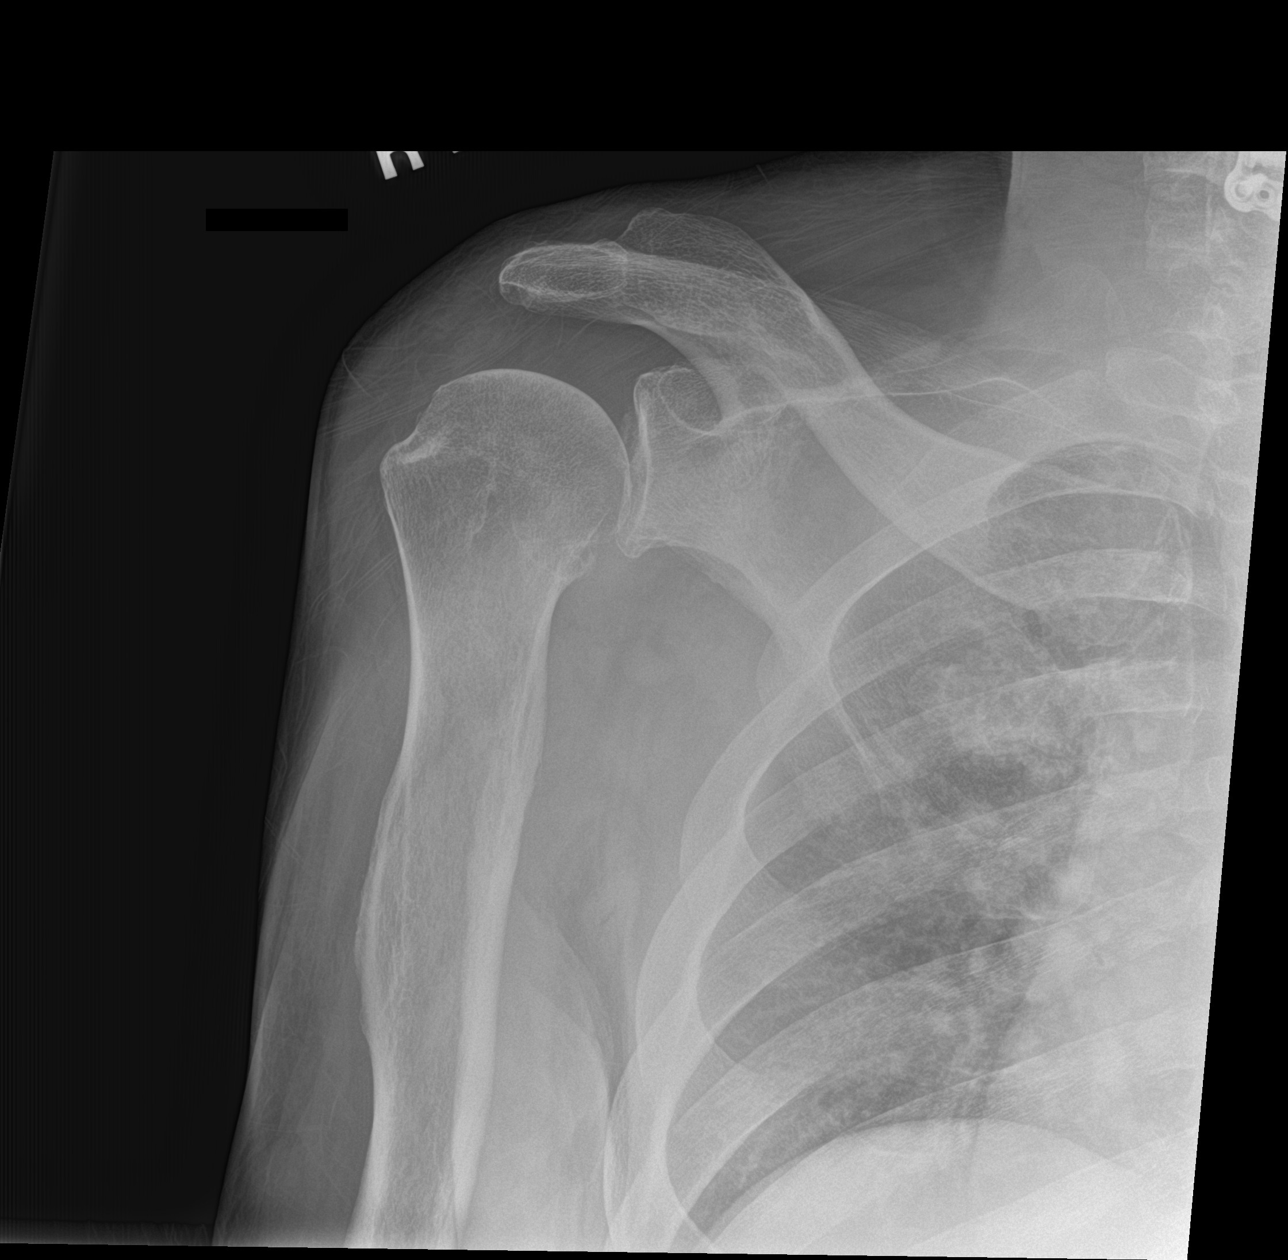

[3 of 3 positions shown; findings below may reference images not displayed]

FINDINGS: Mild AC joint and glenohumeral degenerative change. No acute
fracture or malalignment. Chronic fracture deformity of the proximal
humerus.
IMPRESSION: No acute osseous abnormality.

## 2022-10-13 IMAGING — DX DG ELBOW COMPLETE 3+V*R*
4 series · 4 of 4 positions shown · non-contrast
Comparison: 11/15/2020

CLINICAL DATA: Elbow pain

EXAM:
RIGHT ELBOW - COMPLETE 3+ VIEW

[elbow ap]
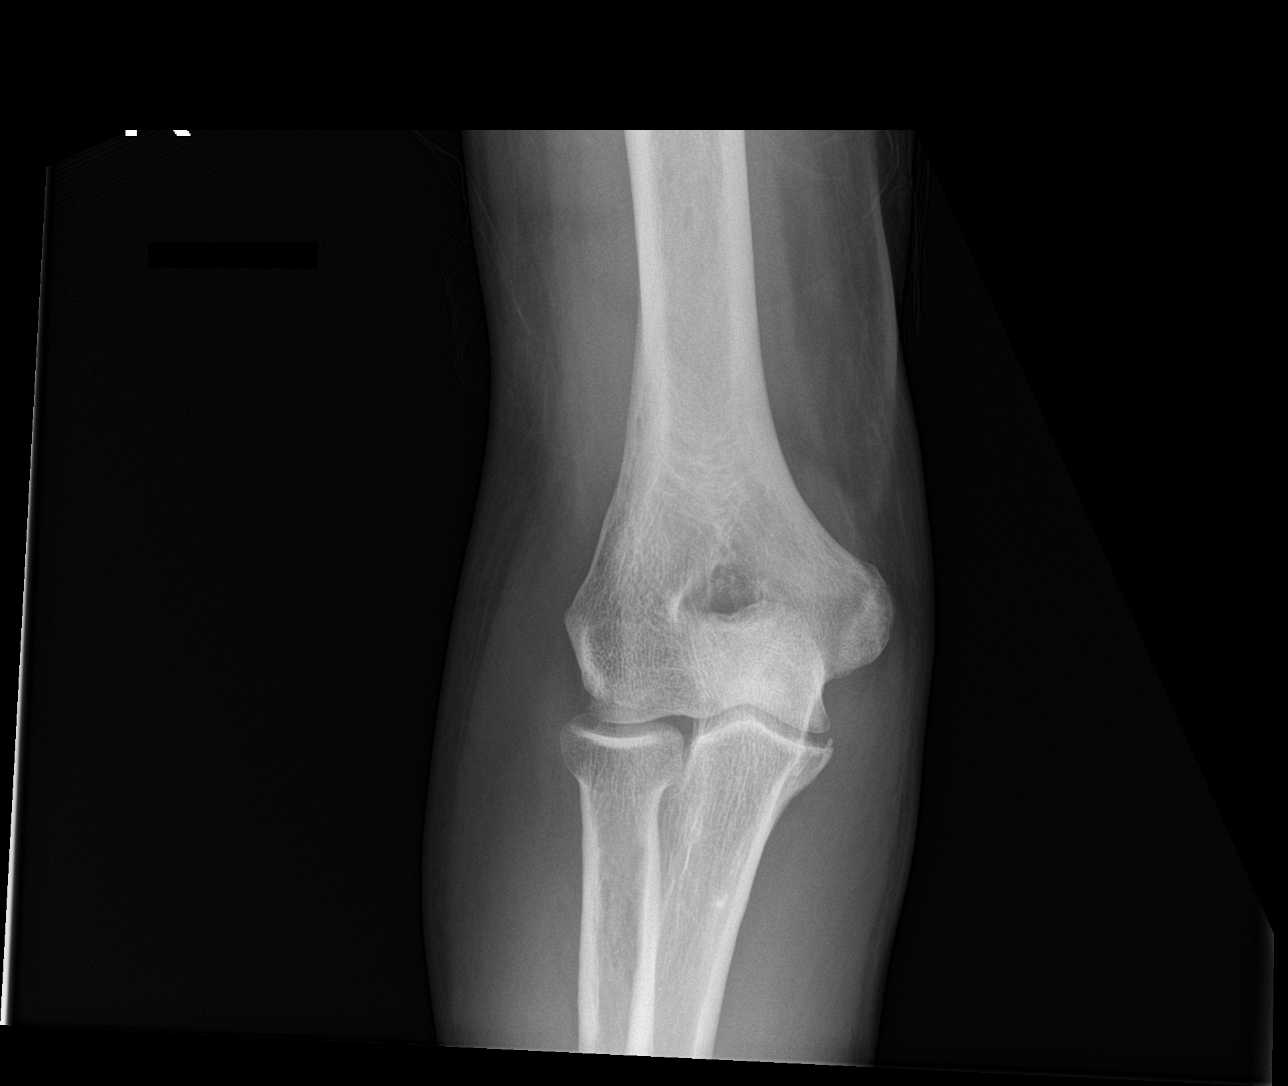

[elbow obl (1 of 2)]
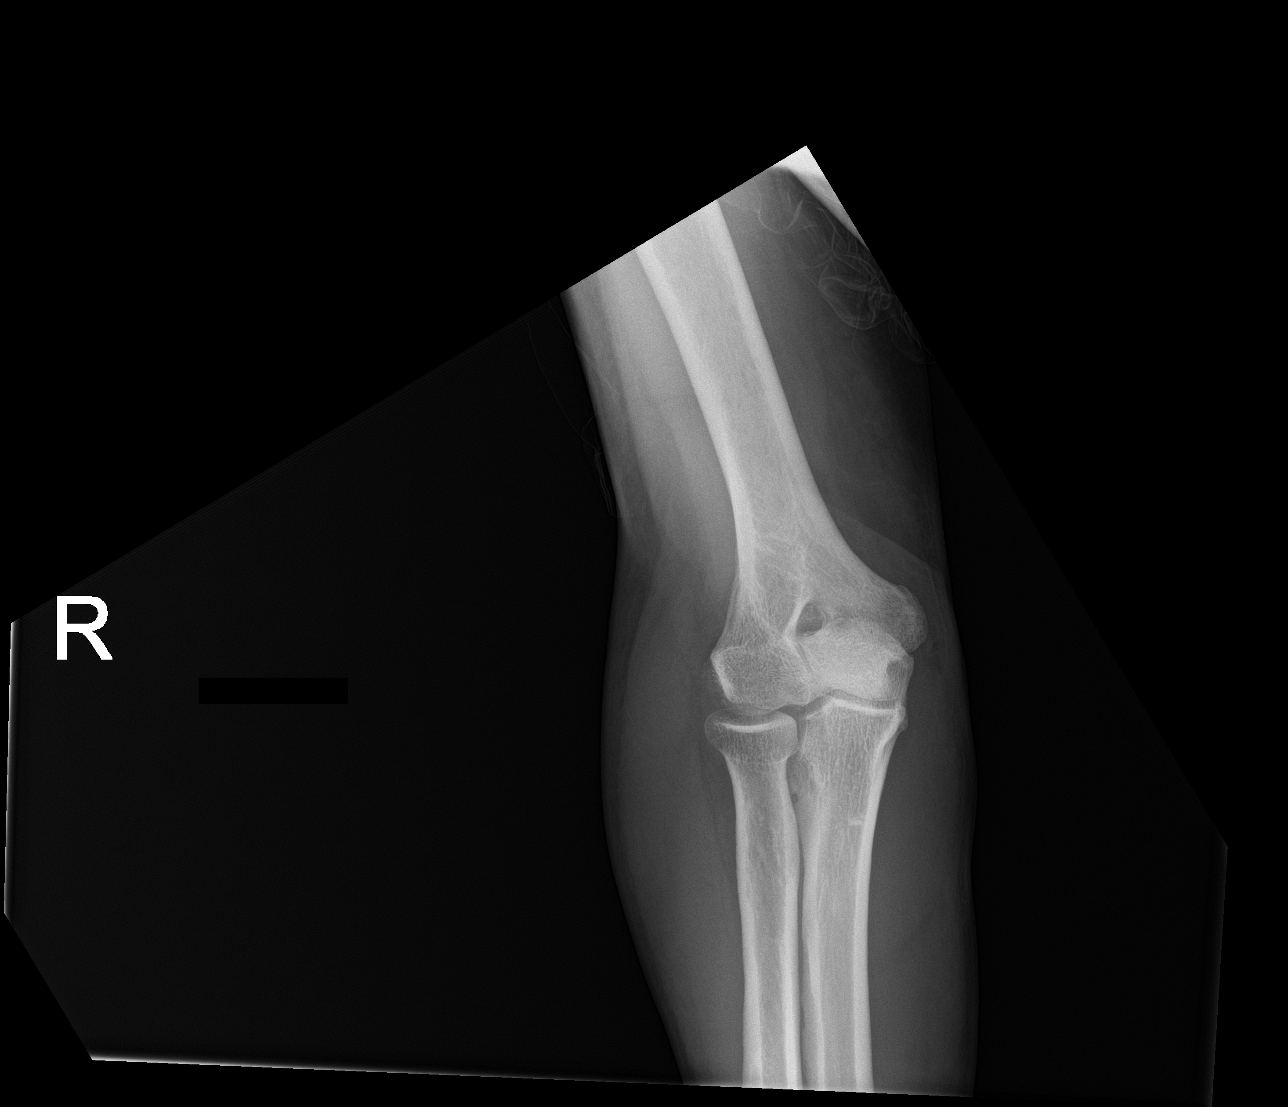

[elbow obl (2 of 2)]
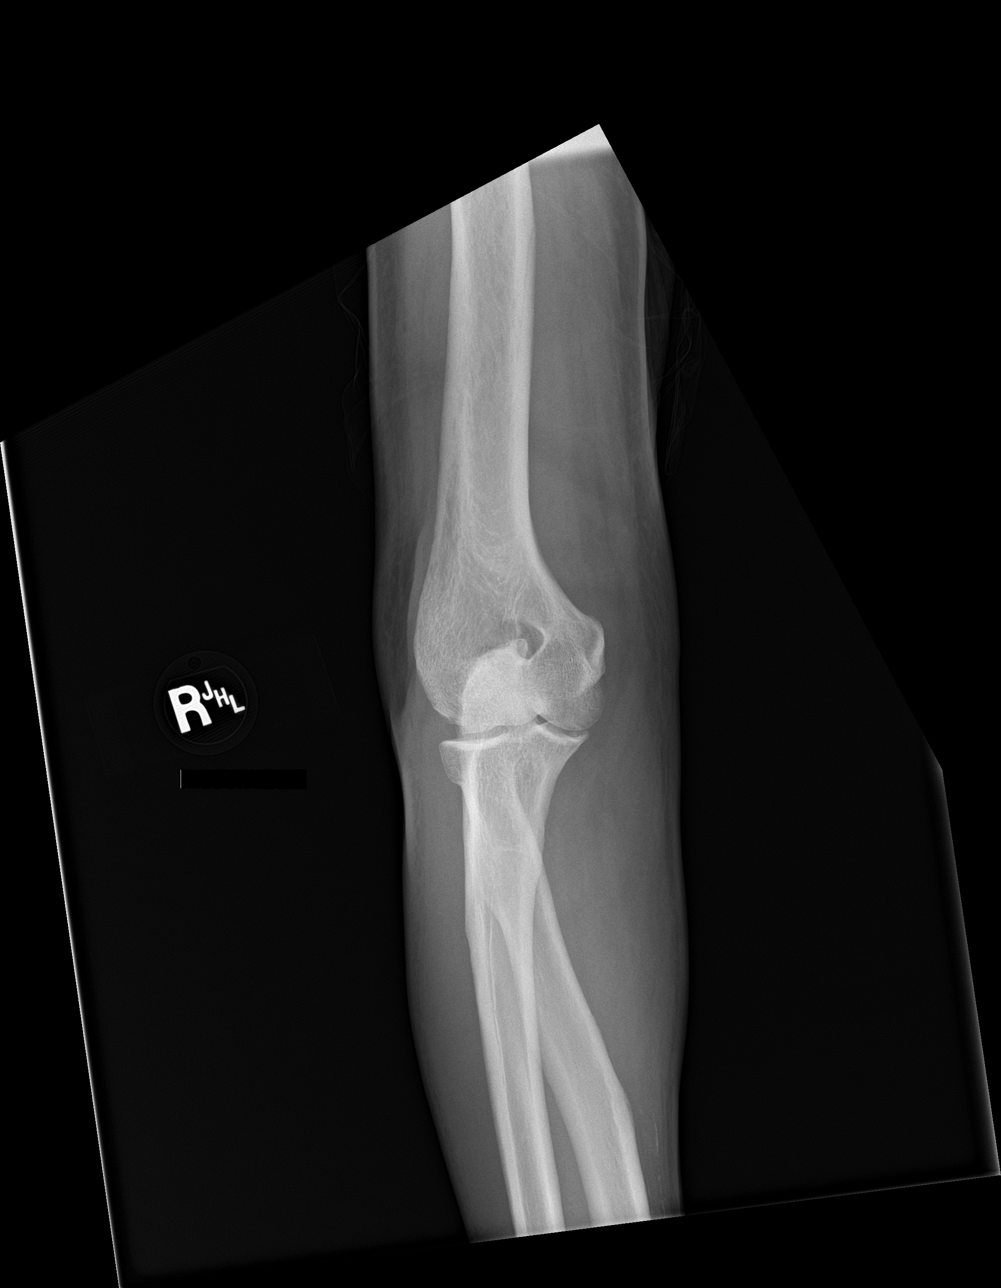

[elbow lat]
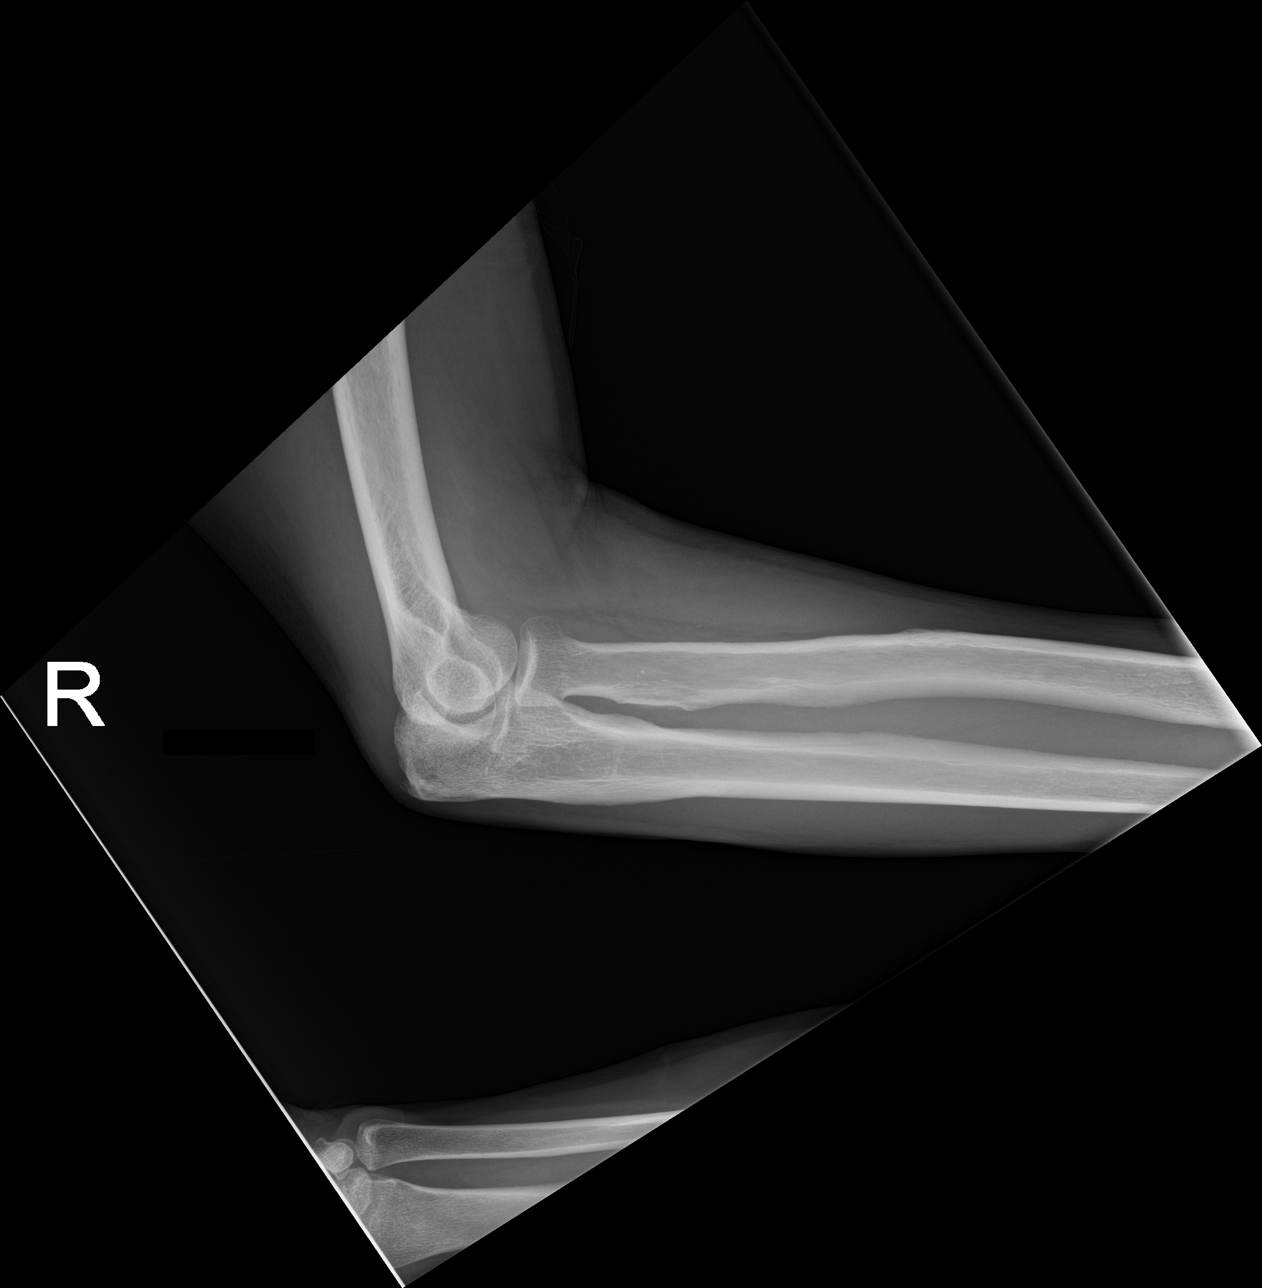

[4 of 4 positions shown; findings below may reference images not displayed]

FINDINGS: There is no evidence of fracture, dislocation, or joint effusion.
There is no evidence of arthropathy or other focal bone abnormality.
Soft tissues are unremarkable.
IMPRESSION: Negative.

## 2022-10-13 IMAGING — DX DG WRIST COMPLETE 3+V*R*
3 series · 3 of 3 positions shown · non-contrast
Comparison: 11/15/2020

CLINICAL DATA: Wrist pain

EXAM:
RIGHT WRIST - COMPLETE 3+ VIEW

[wrist ap]
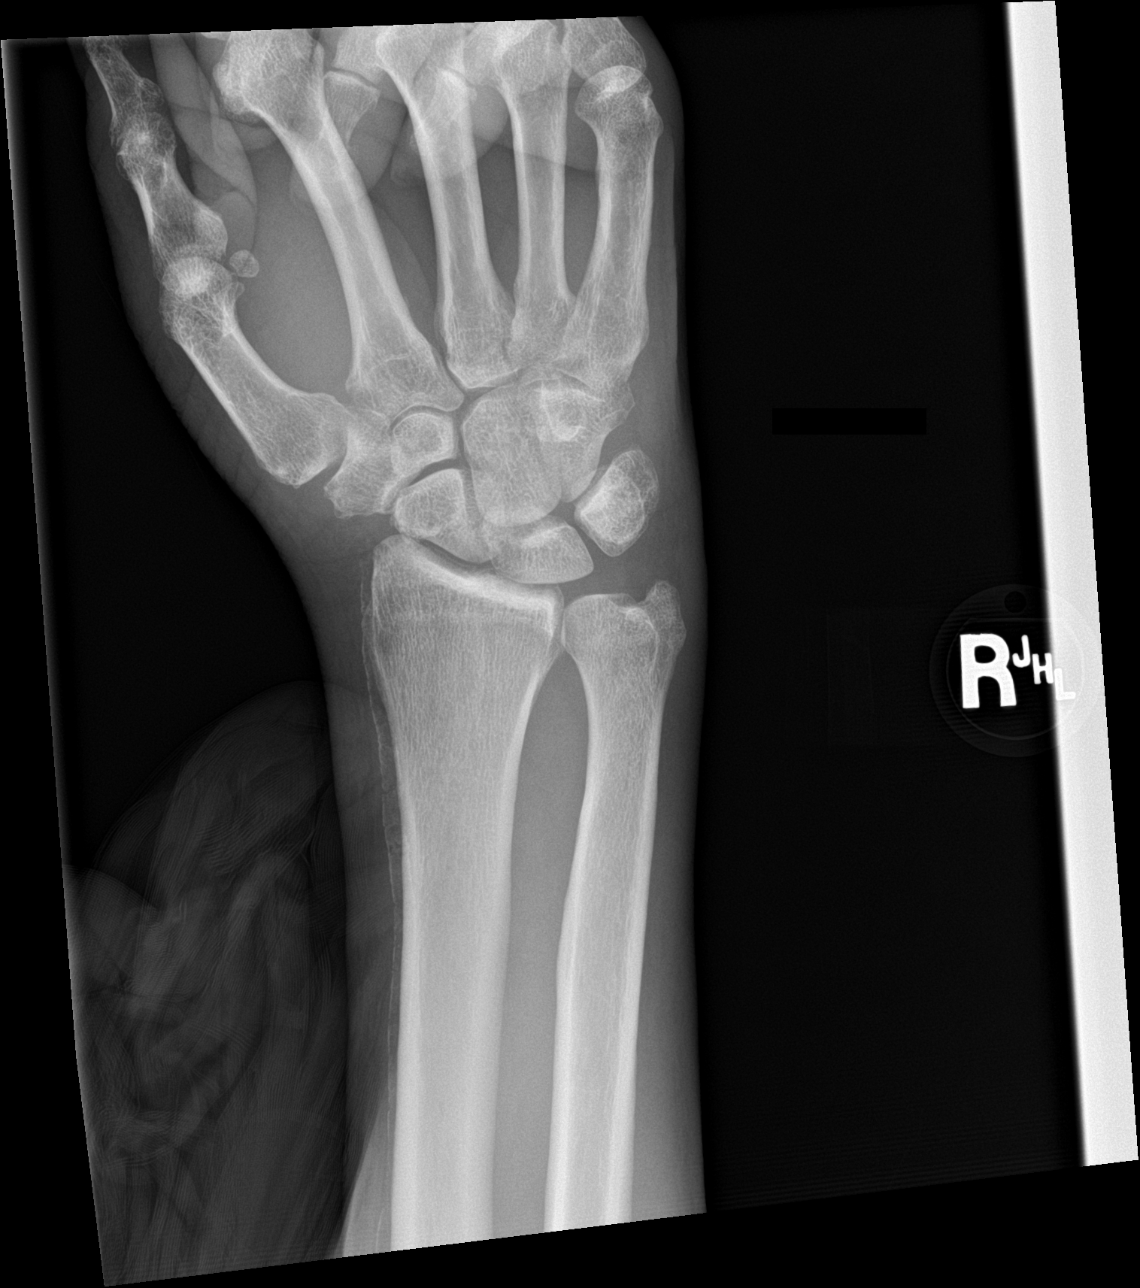

[wrist obl]
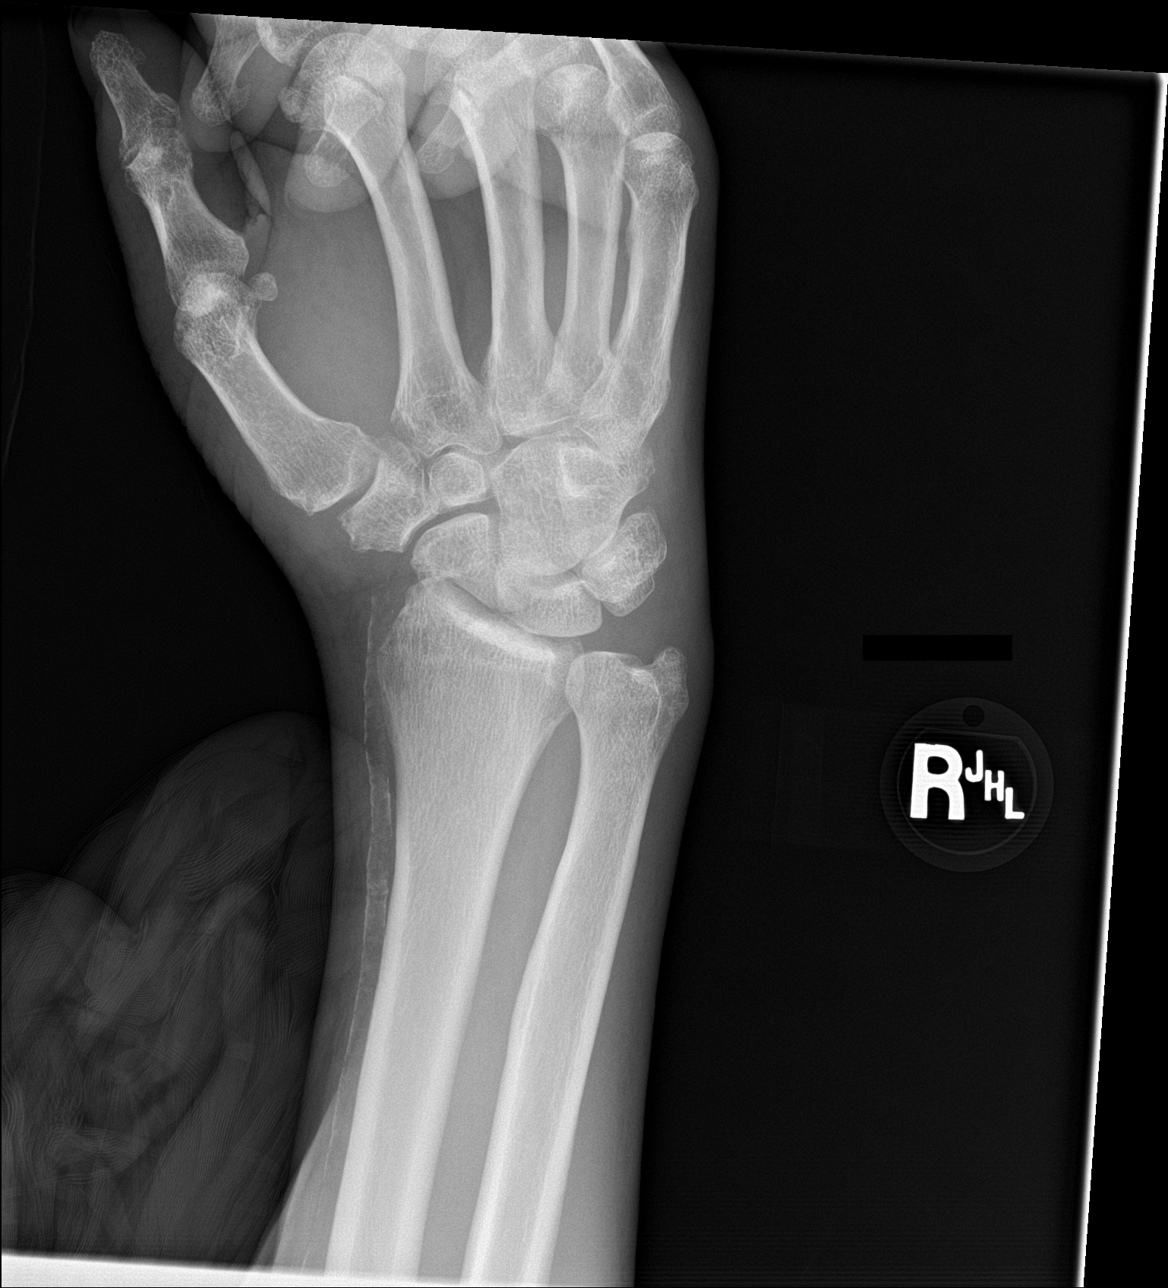

[wrist lat]
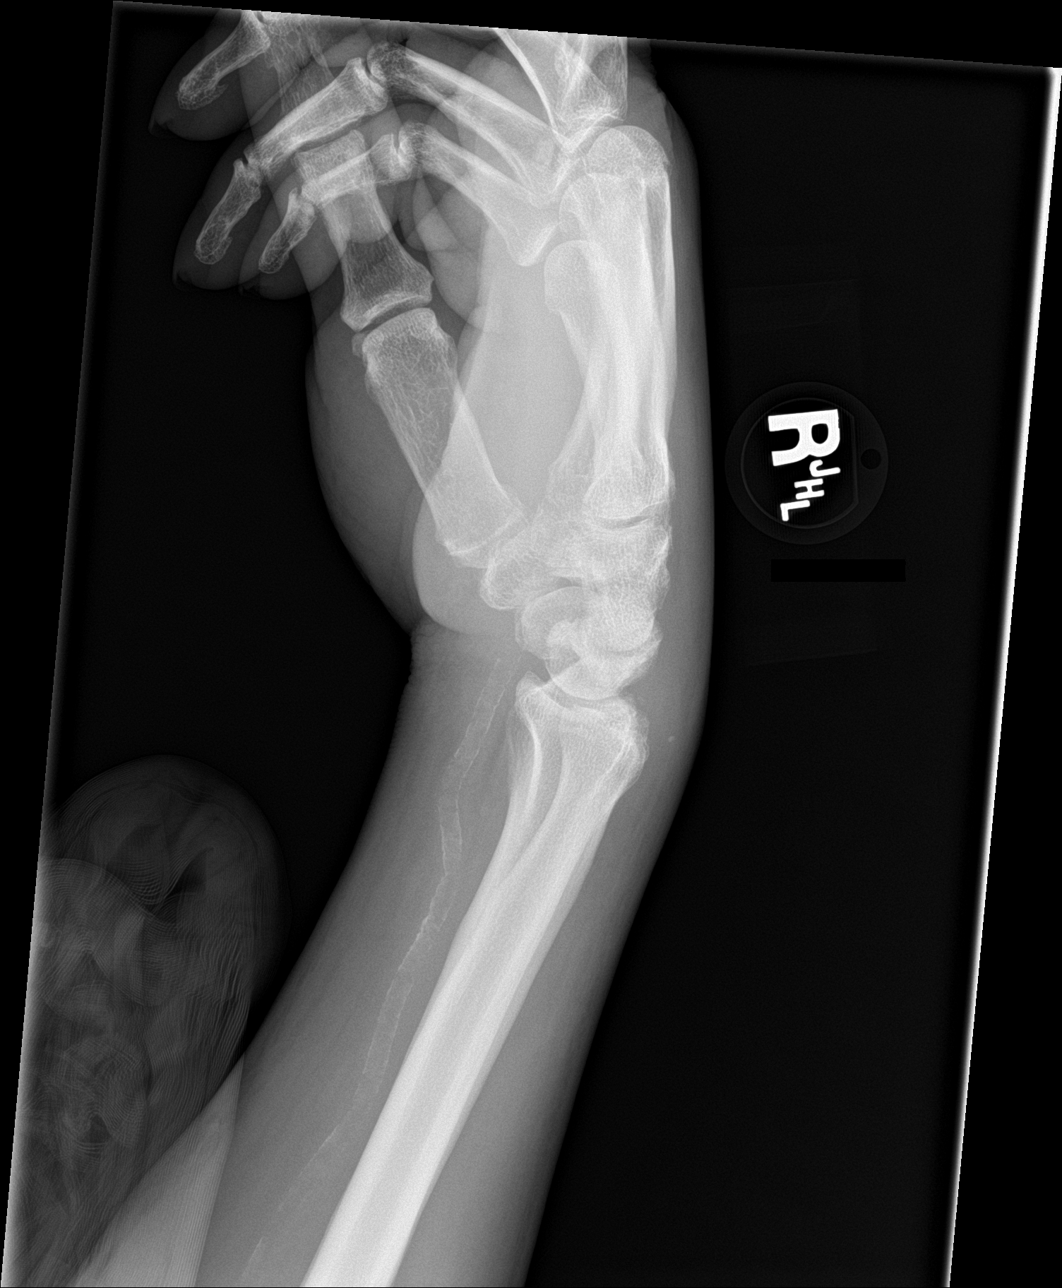

[3 of 3 positions shown; findings below may reference images not displayed]

FINDINGS: No fracture or malalignment. Dorsal soft tissue swelling. Vascular
calcifications. Degenerative changes at the first CMC joint and STT
interval.
IMPRESSION: Soft tissue swelling without definitive acute osseous abnormality

## 2022-10-27 DEATH — deceased

## 2022-11-08 IMAGING — CR DG FOOT COMPLETE 3+V*L*
3 series · 3 of 3 positions shown · non-contrast
Comparison: None.

CLINICAL DATA: Right left foot wound check, rule out gas formation.

EXAM:
LEFT FOOT - COMPLETE 3+ VIEW

[foot ap]
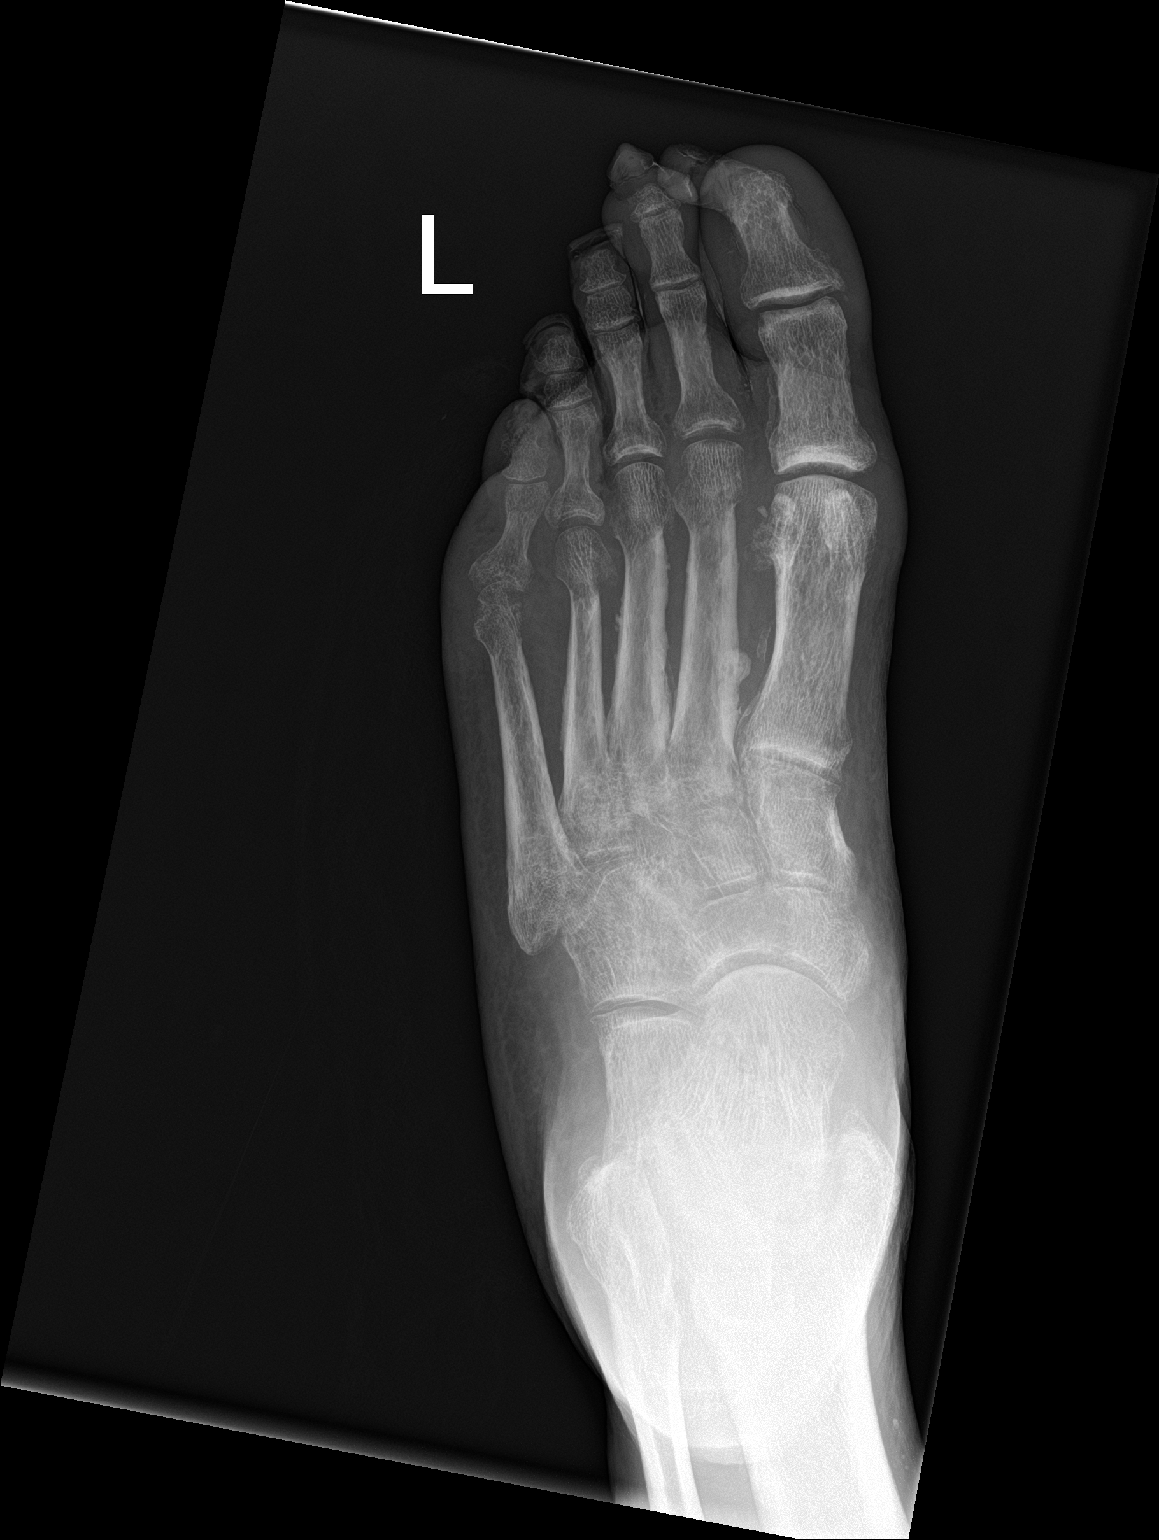

[foot obl]
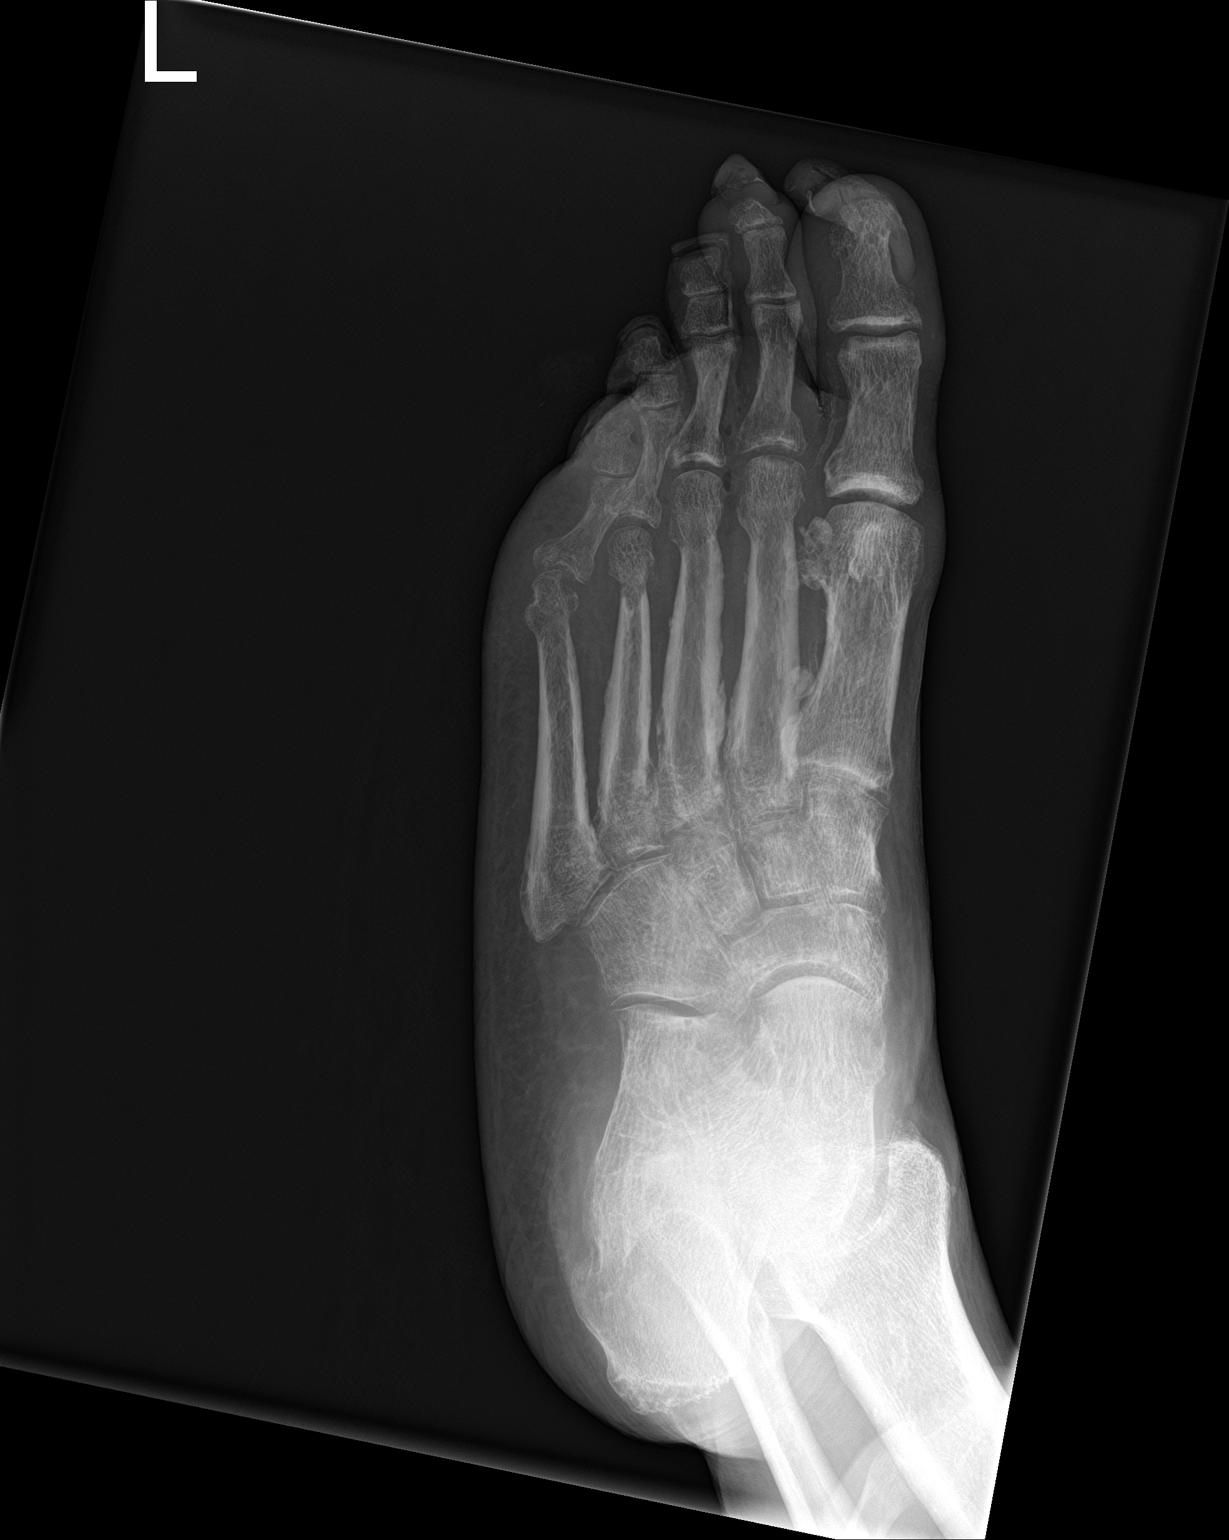

[foot lat]
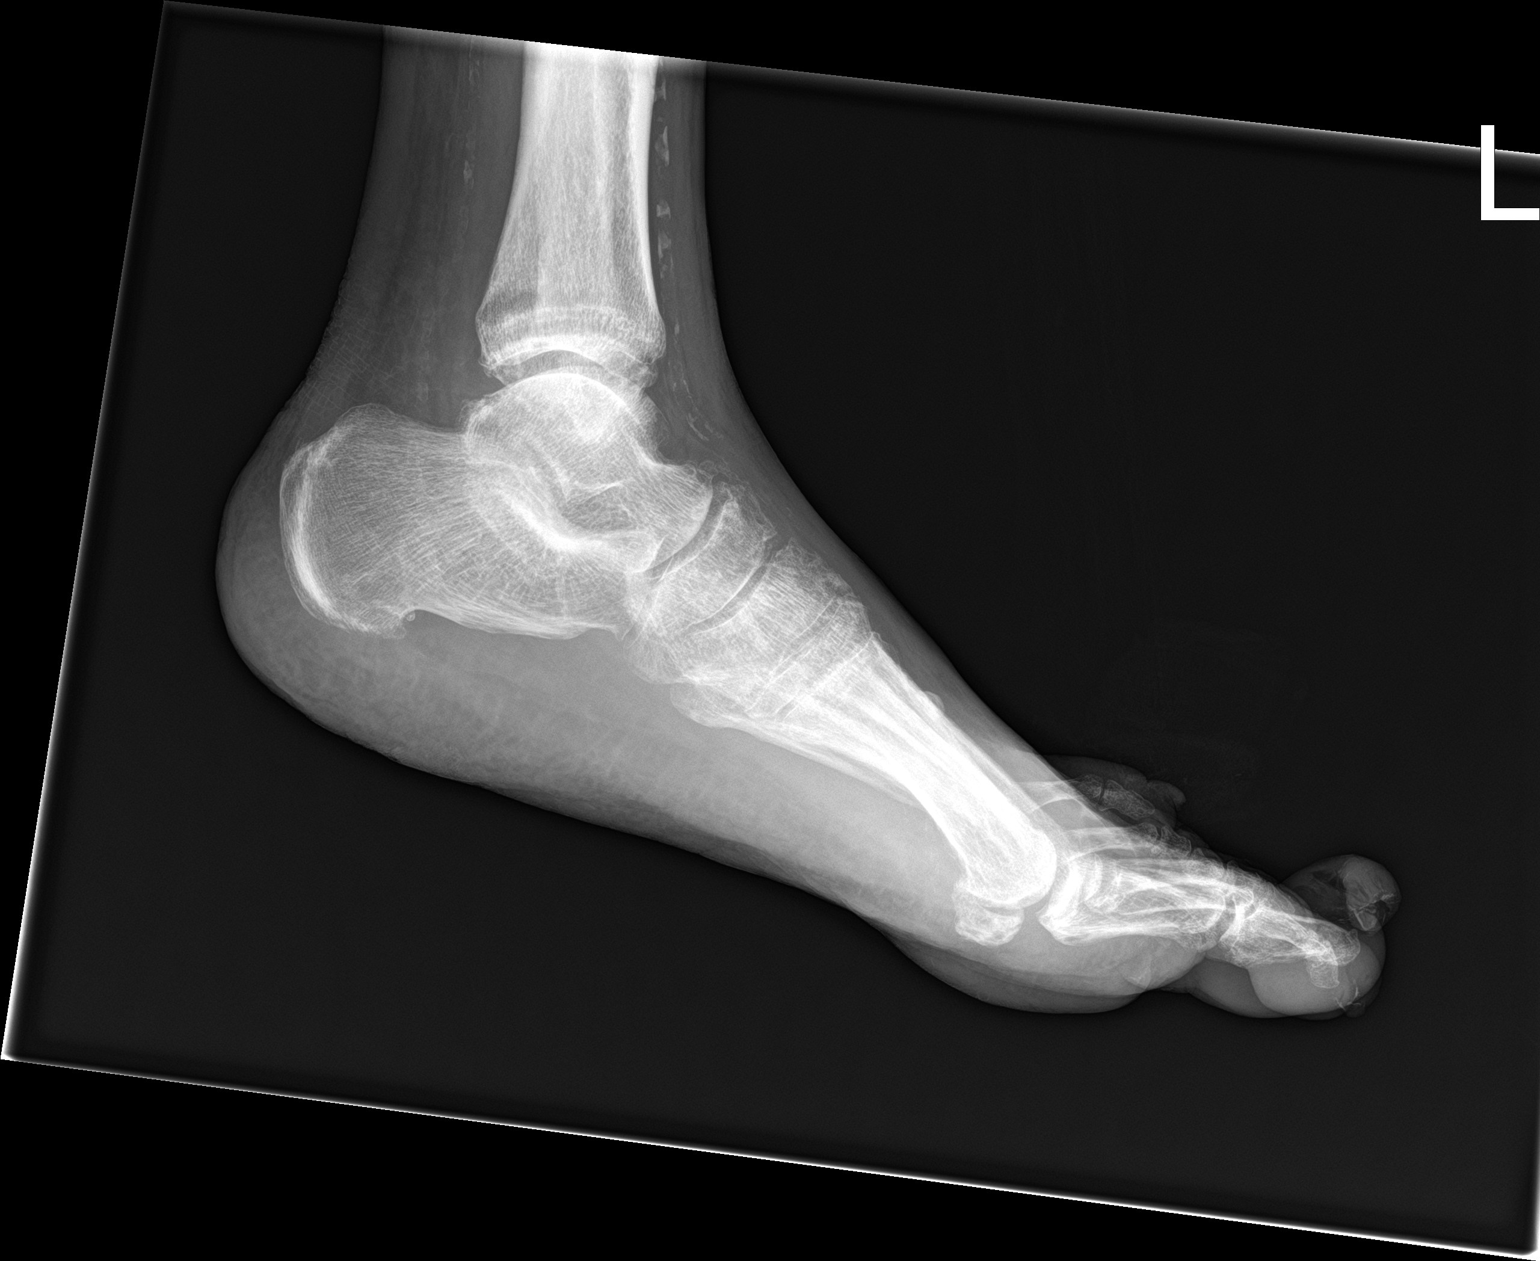

[3 of 3 positions shown; findings below may reference images not displayed]

FINDINGS: Evaluation of the third and fourth digits is limited due to
overlying bandage material. No acute fracture or dislocation. Gas is
identified the soft tissues adjacent about the third digit and head
of the third metatarsal. There are questionable erosions along the
medial aspect of the head of the fifth metatarsal. Mild calcaneal
spurring is present. Vascular calcifications are noted in the soft
tissues. Degenerative changes are noted in the midfoot and ankle.
IMPRESSION: 1. Gas in the soft tissues about the third digit and head of the
third metatarsal, compatible with history of infection. The
possibility of necrotizing fasciitis can not be excluded.
2. Lucency at the head of the fifth metatarsal. The possibility of
underlying osteomyelitis can not be excluded. MRI or three-phase
bone scan is recommended for further evaluation.

## 2022-11-09 IMAGING — MR MR FOOT*L* W/O CM
7 of 10 series · 35 of 40 positions shown · non-contrast
Comparison: Radiographs dated February 28, 2021

CLINICAL DATA: Foot osteomyelitis suspected.  Diabetes type 2, CKD.

EXAM:
MRI OF THE LEFT FOOT WITHOUT CONTRAST
TECHNIQUE: Multiplanar, multisequence MR imaging of the left was performed. No
intravenous contrast was administered.

[Series 16: T1 · oblique · left · 3.0mm · 0.83mm/px · 4 of 48 slices shown (1 of 3)]
[im 1/48]
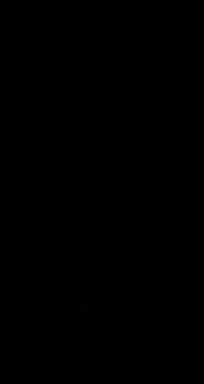
[im 16/48]
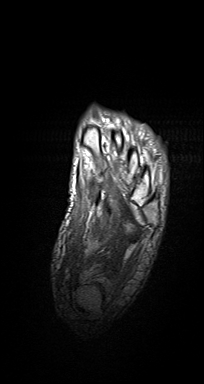
[im 32/48]
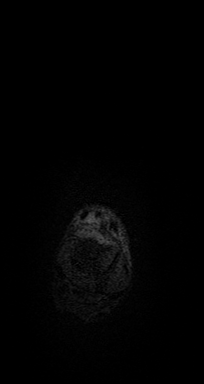
[im 48/48]
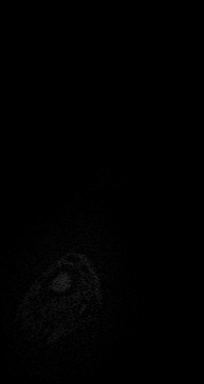

[Series 17: T2 fat-sat · oblique · left · 3.0mm · 1.11mm/px · 4 of 48 slices shown (1 of 3)]
[im 1/48]
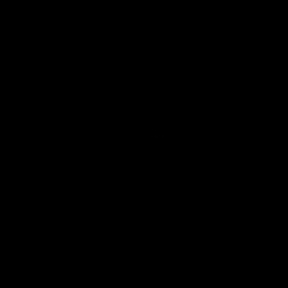
[im 16/48]
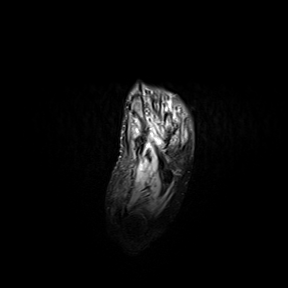
[im 32/48]
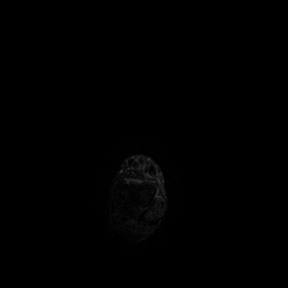
[im 48/48]
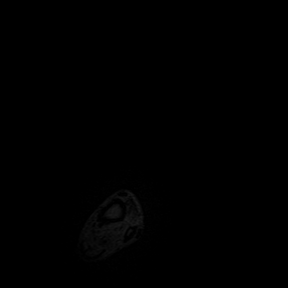

[Series 18: STIR · oblique · left · 3.0mm · 0.98mm/px · 3 of 36 slices shown]
[im 1/36]
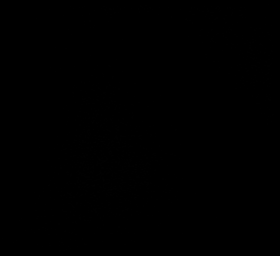
[im 18/36]
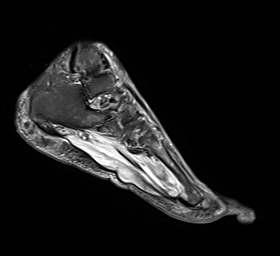
[im 36/36]
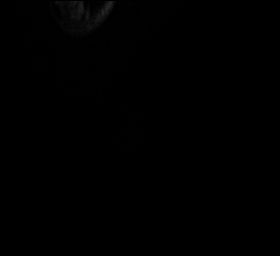

[Series 20: T2 fat-sat · oblique · left · 3.5mm · 0.49mm/px · 6 of 62 slices shown (2 of 3)]
[im 1/62]
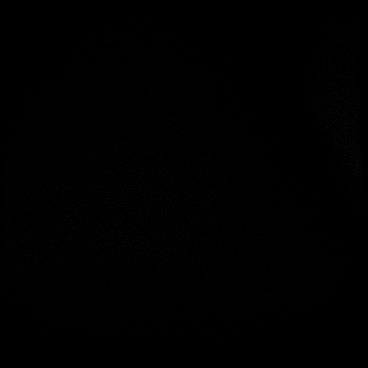
[im 13/62]
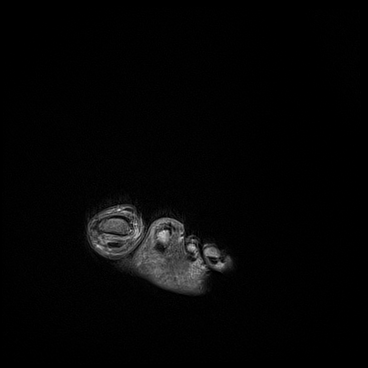
[im 25/62]
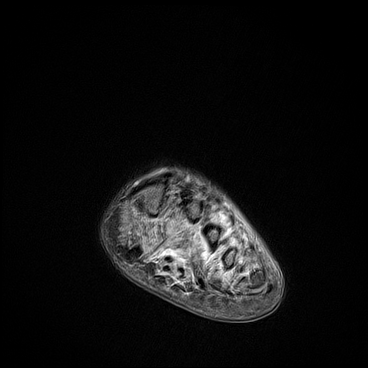
[im 37/62]
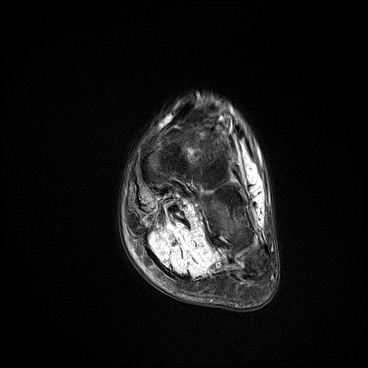
[im 49/62]
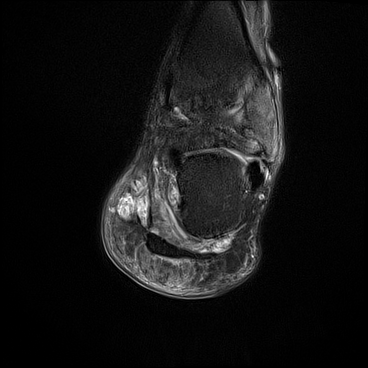
[im 62/62]
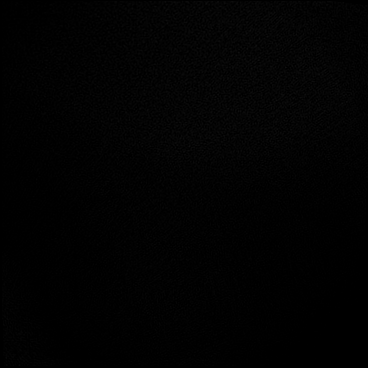

[Series 21: T1 · oblique · left · 3.5mm · 0.47mm/px · 6 of 64 slices shown (2 of 3)]
[im 1/64]
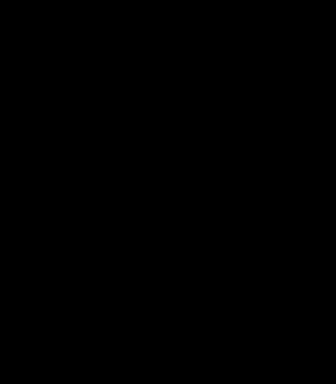
[im 13/64]
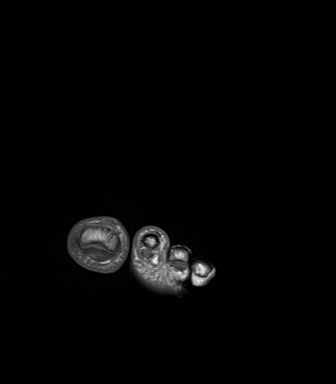
[im 26/64]
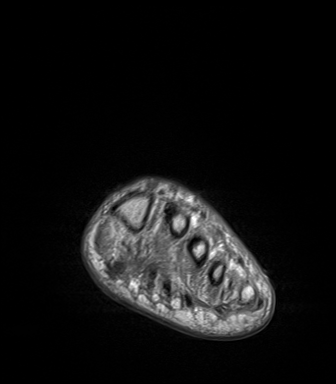
[im 38/64]
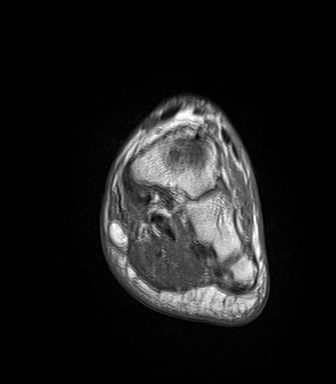
[im 51/64]
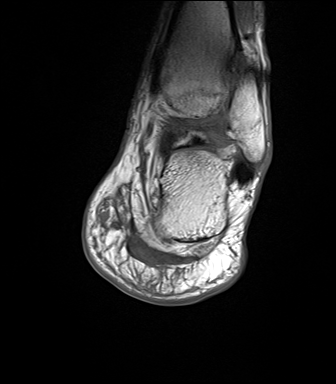
[im 64/64]
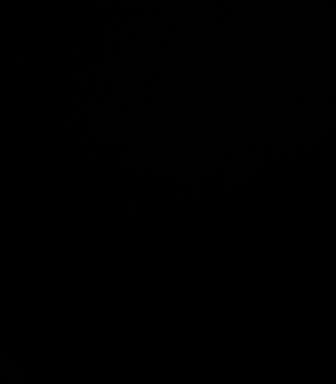

[Series 22: T2 fat-sat · oblique · left · 3.5mm · 0.54mm/px · 6 of 62 slices shown (3 of 3)]
[im 1/62]
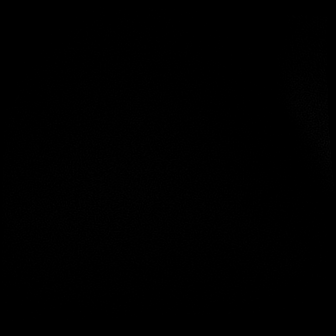
[im 13/62]
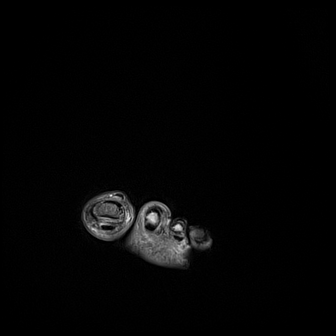
[im 25/62]
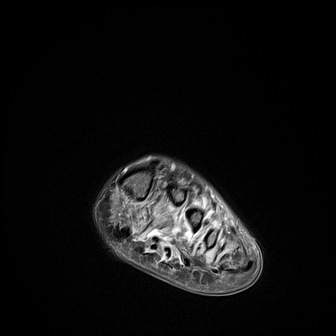
[im 37/62]
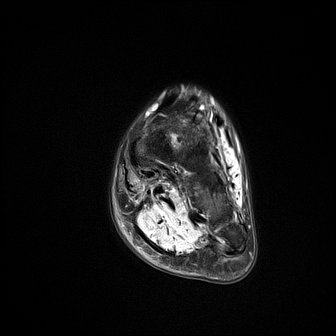
[im 49/62]
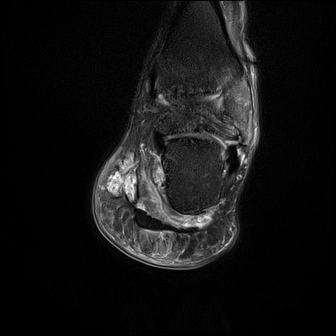
[im 62/62]
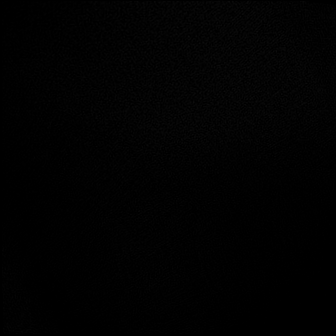

[Series 23: T1 · oblique · left · 3.5mm · 0.47mm/px · 6 of 64 slices shown (3 of 3)]
[im 1/64]
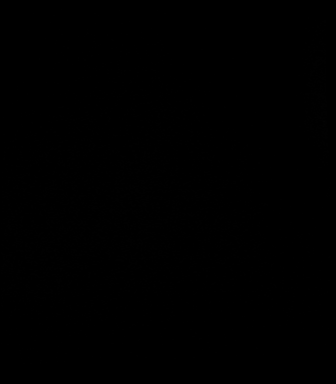
[im 13/64]
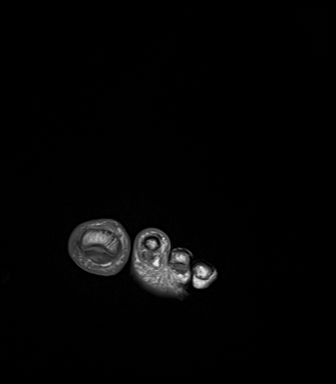
[im 26/64]
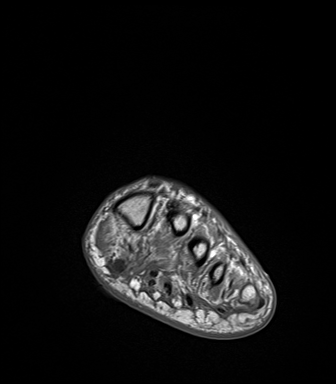
[im 38/64]
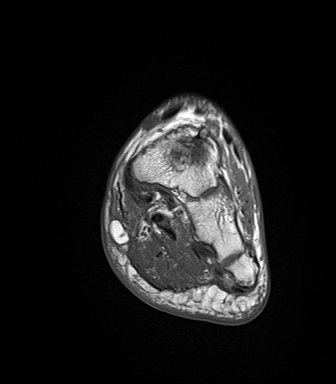
[im 51/64]
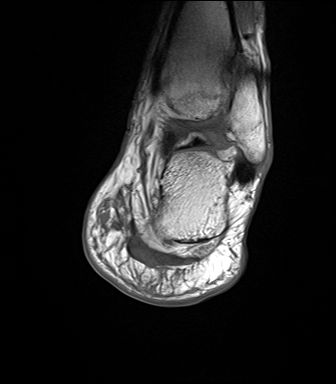
[im 64/64]
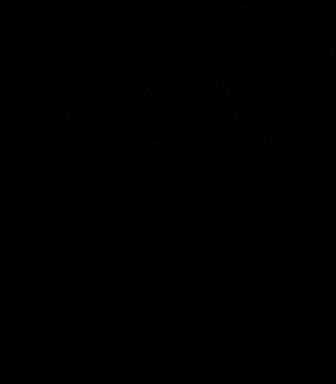

[35 of 40 positions shown; findings below may reference images not displayed]

FINDINGS: Bones/Joint/Cartilage

There is heterogeneity of the marrow of the proximal phalanx of the
first digit as well as proximal and middle phalanges of the second
and third digits. No corresponding signal abnormality on T1 or
evidence of deep skin wound.

Ligaments

Grossly intact.

Muscles and Tendons

There is markedly increased intrasubstance signal of the plantar
muscles without evidence of drainable fluid collection or abscess on
this noncontrast enhanced examination. The findings are suspicious
for diabetic myopathy/myositis.

Soft tissues

Mild subcutaneous soft tissue edema about the third and fourth
proximal phalanges.
IMPRESSION: 1. No definite evidence of osteomyelitis. Heterogeneous marrow
signal about the proximal phalanx of the first digit as well as
proximal and middle phalanges of the second and third digits. Which
in the absence of adjacent deep skin wound is a nonspecific finding
and could be secondary to altered mechanics.

2. Increased intrasubstance signal of the plantar muscles with trace
amount of fluid about the second and third digit tendons, likely
sequela of diabetic myopathy/myositis. No drainable fluid collection
or abscess on this noncontrast enhanced examination.

## 2022-11-09 IMAGING — DX DG FOOT COMPLETE 3+V*L*
2 series · 3 of 3 positions shown · non-contrast
Comparison: None.

CLINICAL DATA: Postop, wound check.

EXAM:
LEFT FOOT - COMPLETE 3+ VIEW

[Series 1: foot · 0.14mm/px · 2 of 2 slices shown]
[im 1/2]
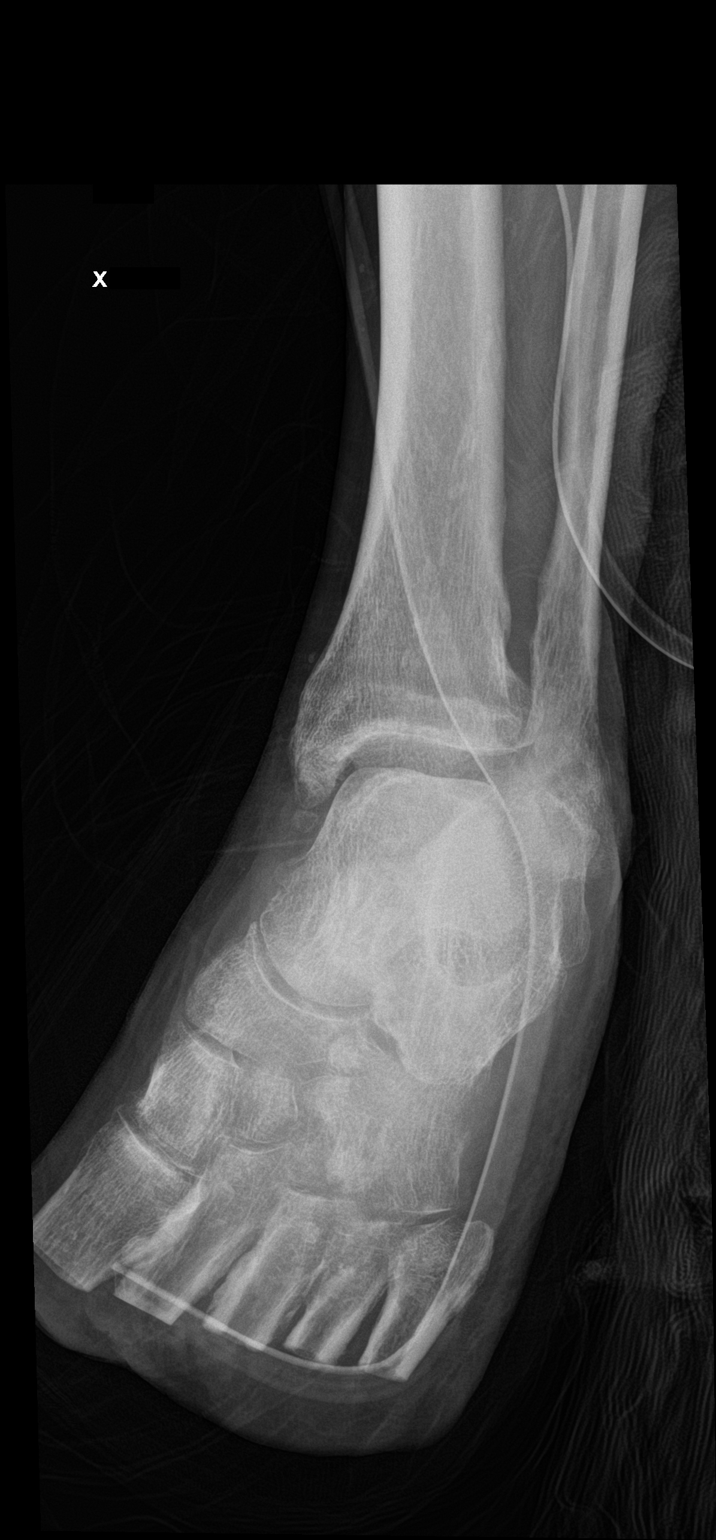
[im 2/2]
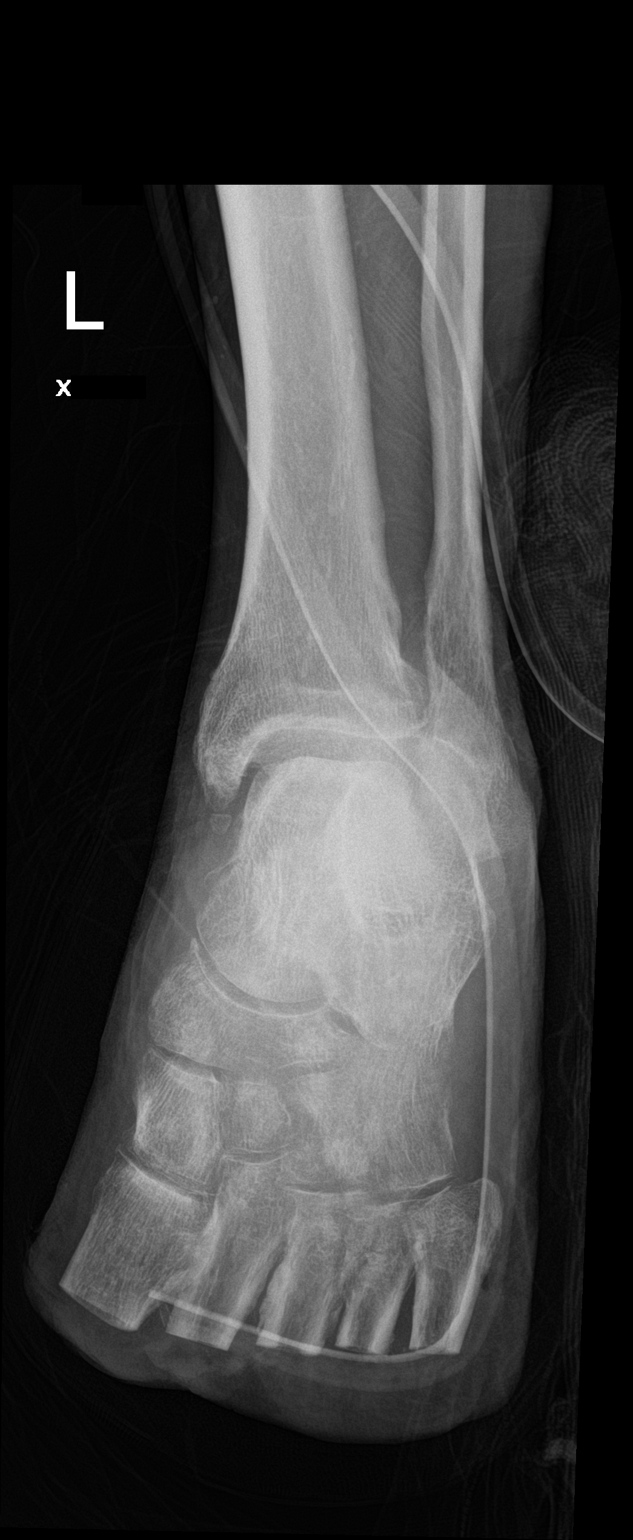

[leg]
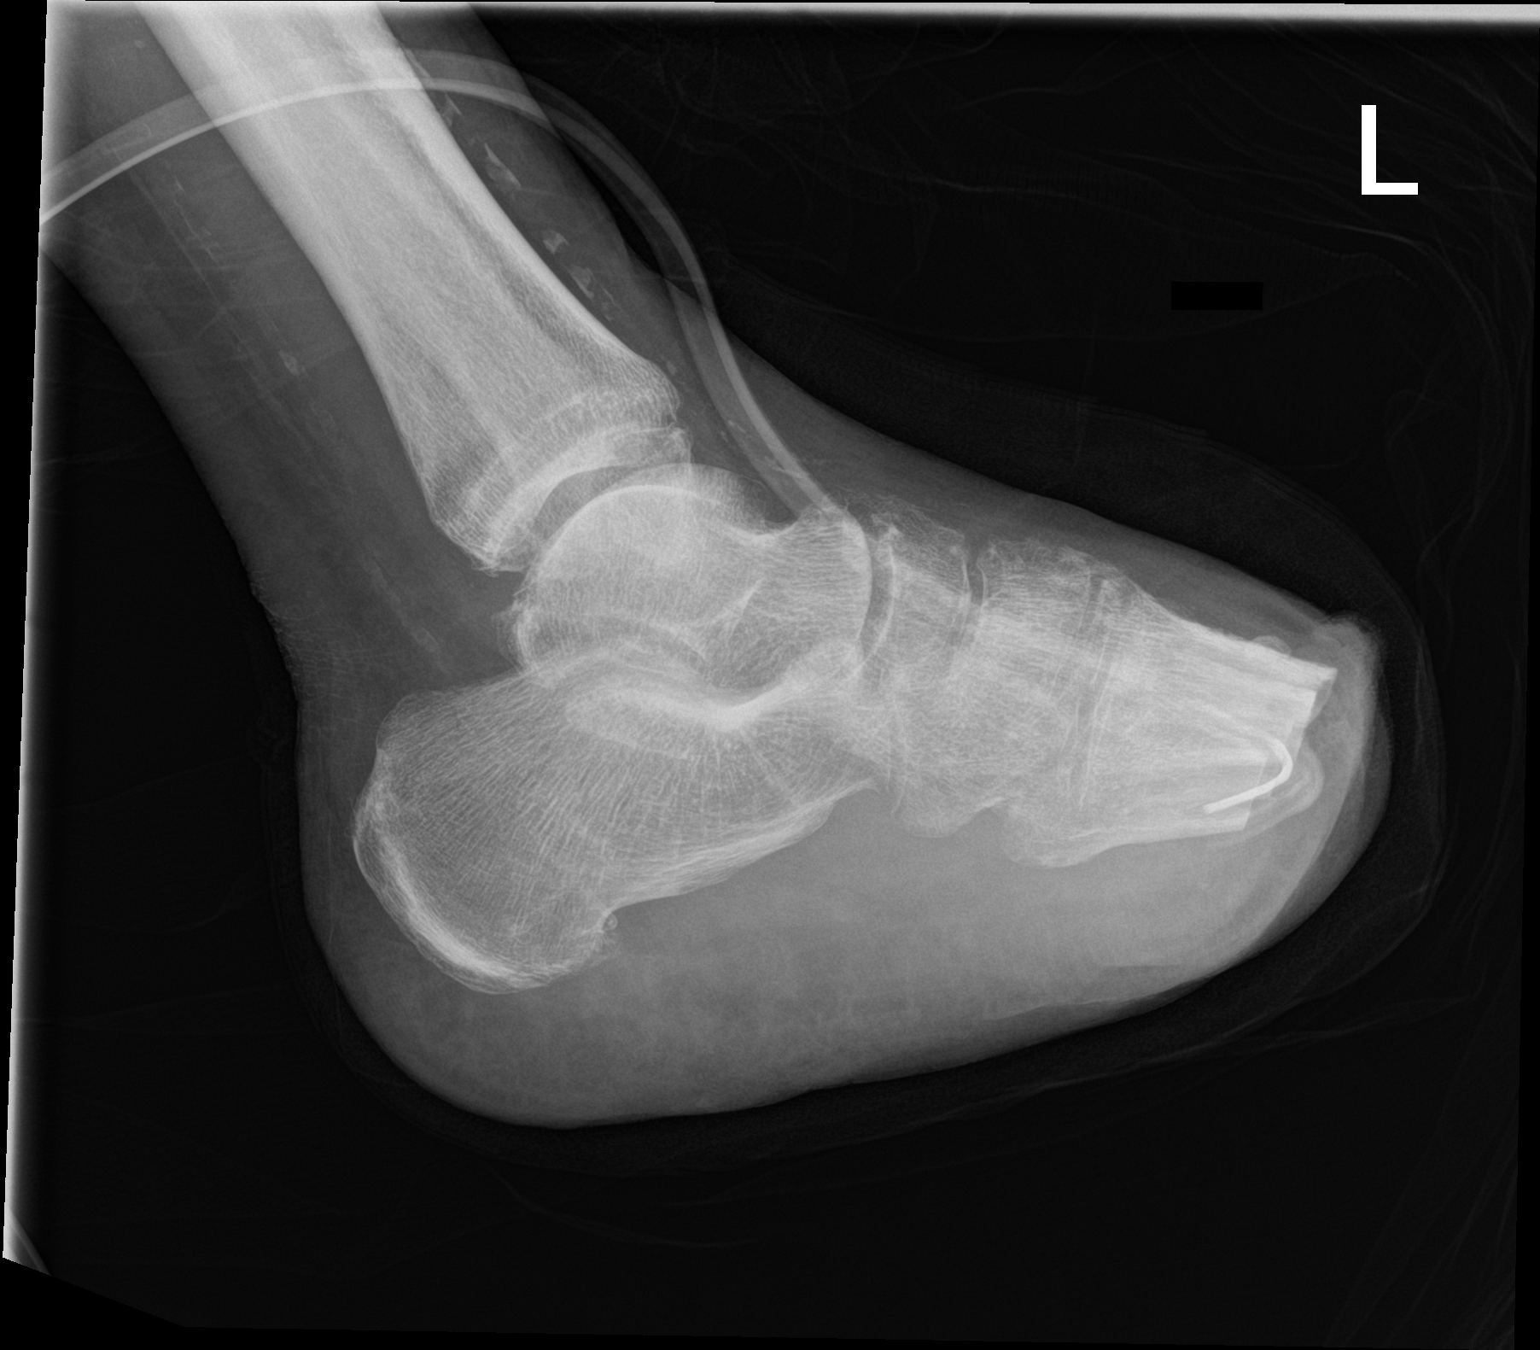

[3 of 3 positions shown; findings below may reference images not displayed]

FINDINGS: Status post transmetatarsal amputation. Surgical drain in place.
Diffuse osteopenia. Prominent vascular calcifications.
IMPRESSION: Status post transmetatarsal amputation of the forefoot with surgical
drain in place.

## 2022-12-21 IMAGING — CT CT HEAD W/O CM
4 series · 15 of 47 positions shown, 17 images · non-contrast
Comparison: MRI brain 12/19/2020

CLINICAL DATA: Dizziness.  Delusional.



[Series 2: head wo · axial · 0.49mm/px · z∈[-100,+10]mm · 7 of 30 slices shown, 9 images]
[im 4/30  brain]
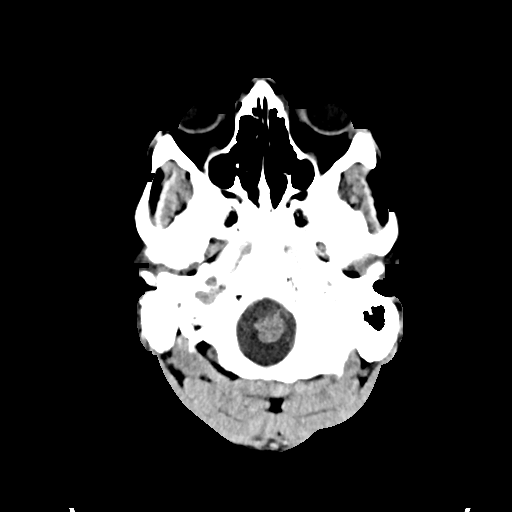
[im 4/30  bone]
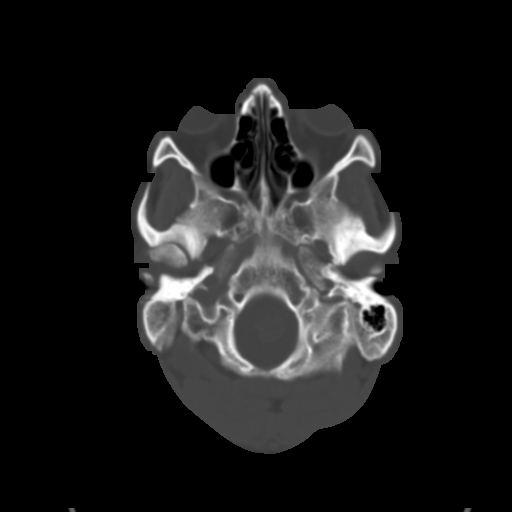
[im 8/30  brain]
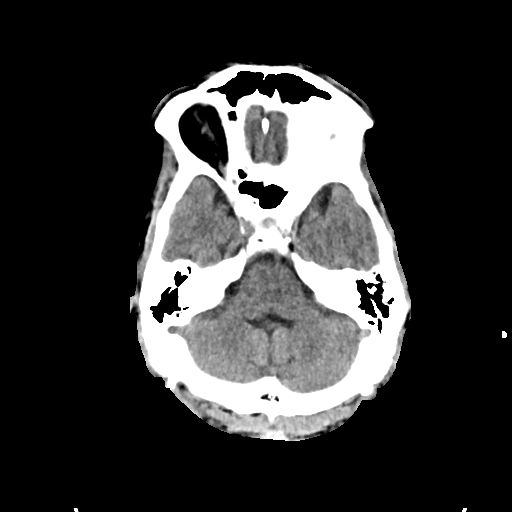
[im 11/30  brain]
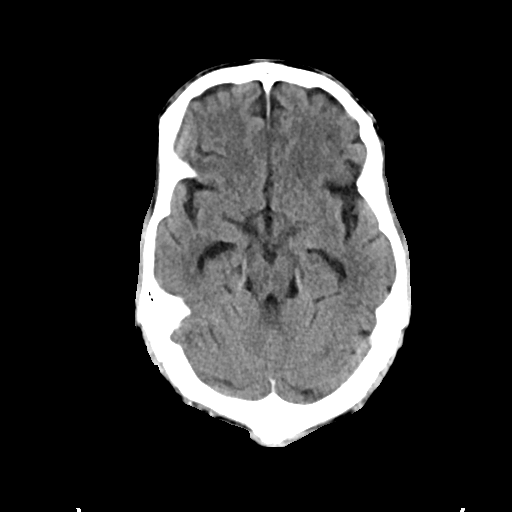
[im 15/30  brain]
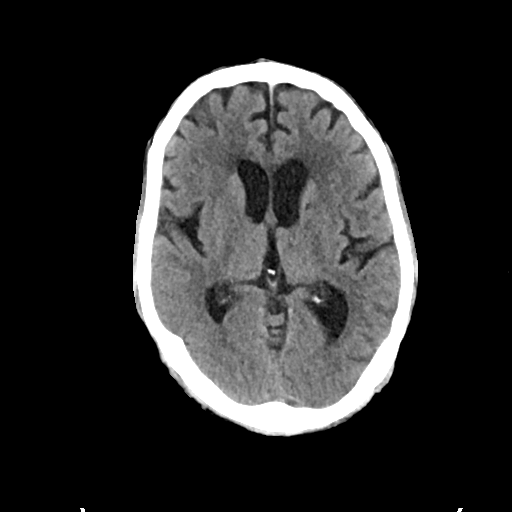
[im 19/30  brain]
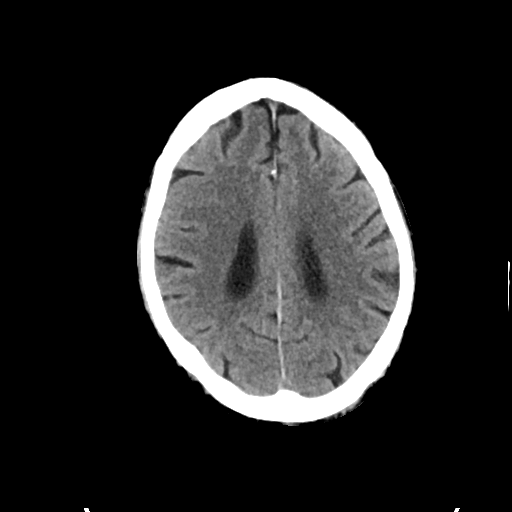
[im 19/30  bone]
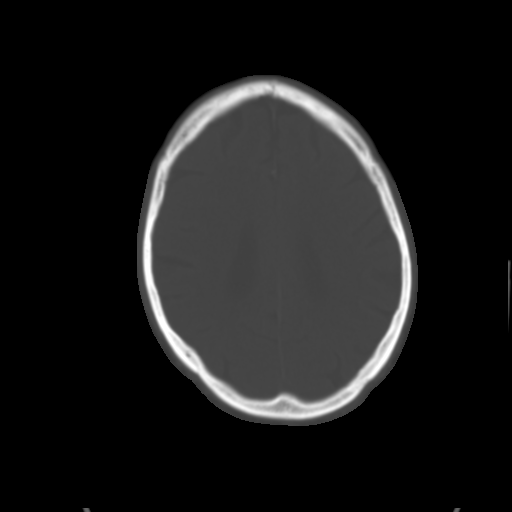
[im 22/30  brain]
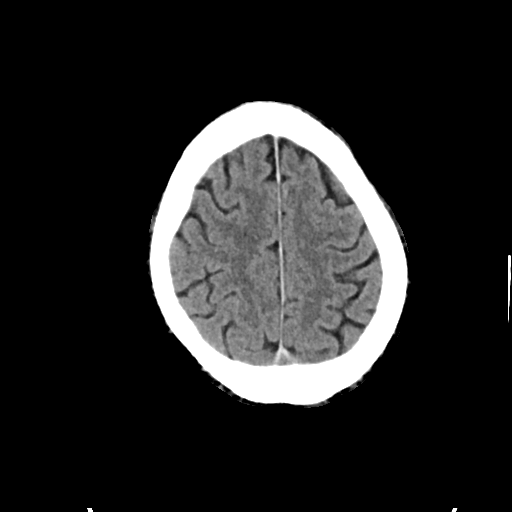
[im 26/30  brain]
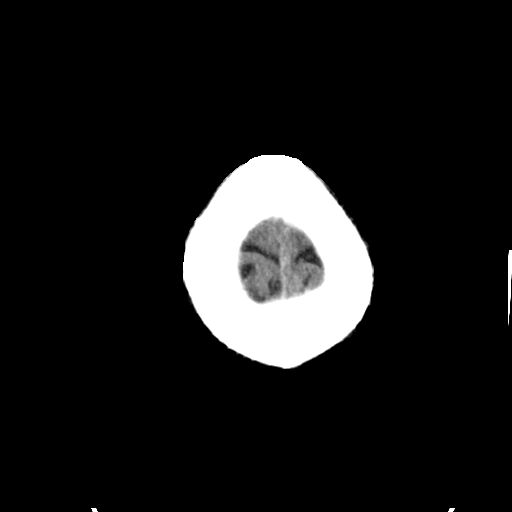

[Series 3: head bone · axial · 0.49mm/px · z∈[-100,-86]mm · 2 of 73 slices shown]
[im 8/73  bone]
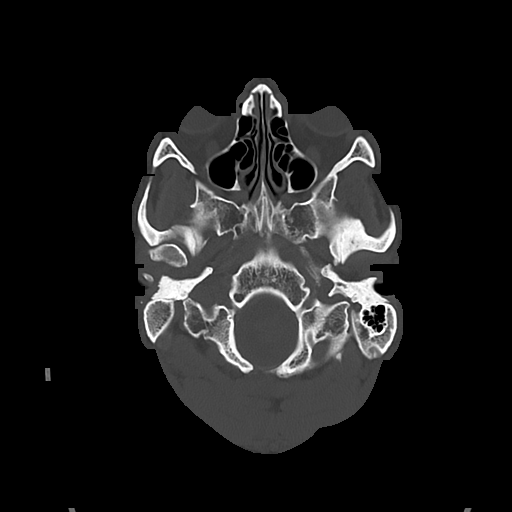
[im 15/73  bone]
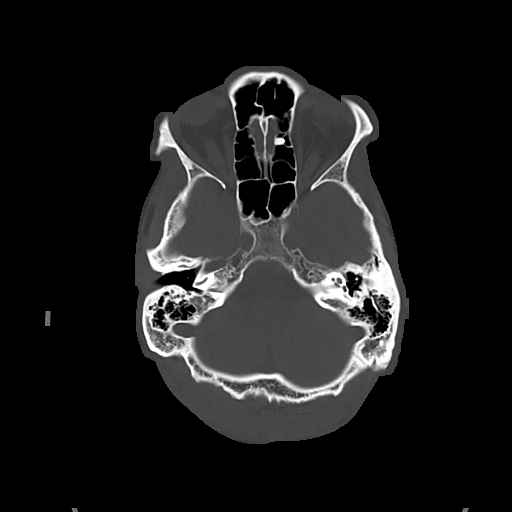

[Series 4: cor soft · coronal · 0.32mm/px · 3 of 67 slices shown]
[im 23/67  brain]
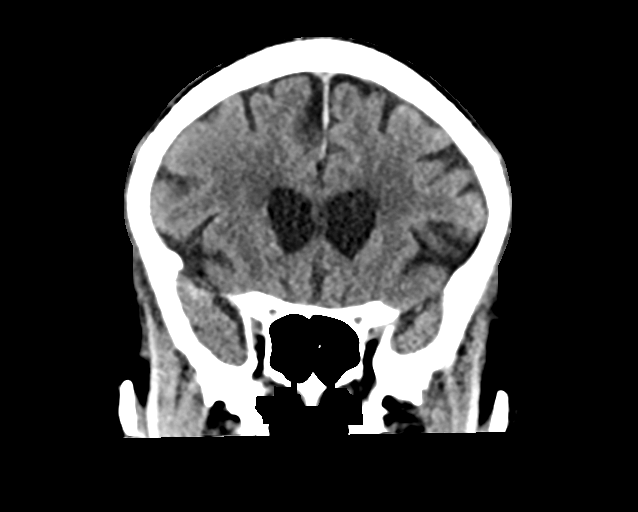
[im 30/67  brain]
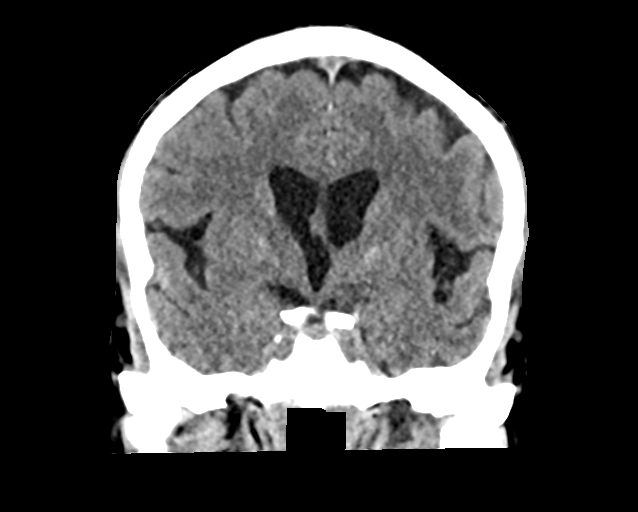
[im 37/67  brain]
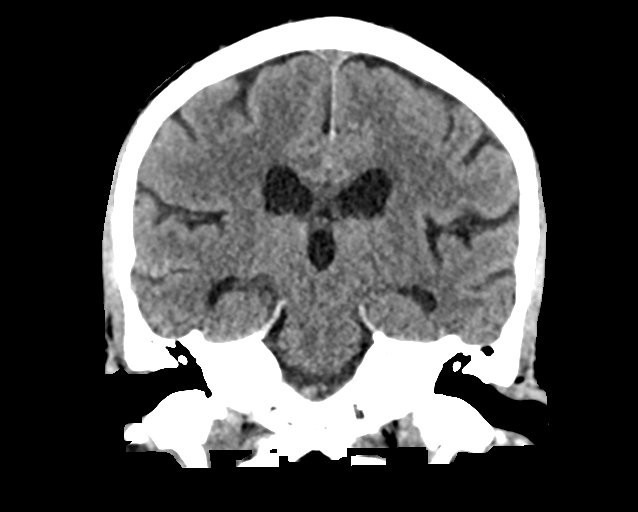

[Series 5: sag soft · sagittal · 0.32mm/px · 3 of 56 slices shown]
[im 19/56  brain]
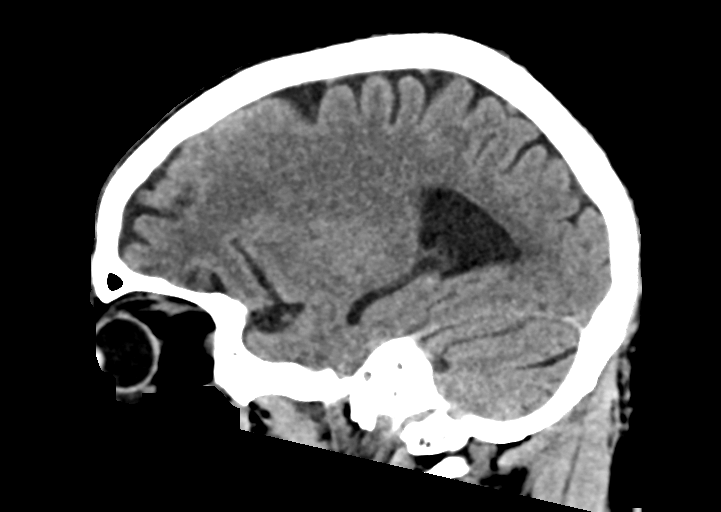
[im 28/56  brain]
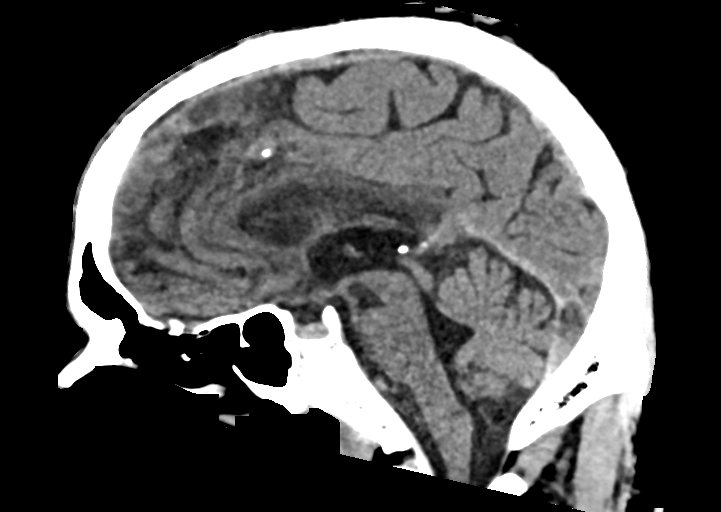
[im 37/56  brain]
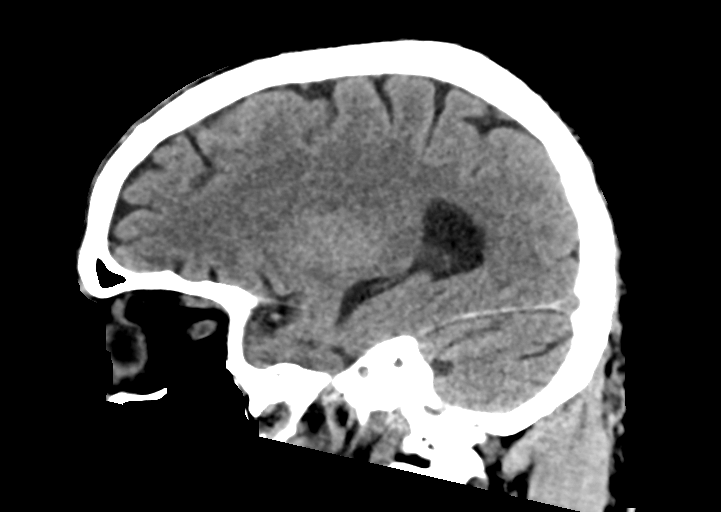

[15 of 47 positions shown; findings below may reference images not displayed]

FINDINGS: Brain: No evidence of acute infarction, hemorrhage, hydrocephalus,
extra-axial collection or mass lesion/mass effect. There is mild
diffuse low-attenuation within the subcortical and periventricular
white matter compatible with chronic microvascular disease.
Prominence of the sulci and ventricles compatible with brain
atrophy.

Vascular: No hyperdense vessel or unexpected calcification.

Skull: Normal. Negative for fracture or focal lesion.

Sinuses/Orbits: No acute abnormality

Other: None
IMPRESSION: 1. No acute intracranial abnormalities.
2. Chronic small vessel ischemic disease and brain atrophy.

## 2023-04-17 NOTE — Progress Notes (Signed)
error
